# Patient Record
Sex: Female | Born: 1990 | Race: Black or African American | Hispanic: No | Marital: Single | Smoking: Never smoker
Health system: Southern US, Community
[De-identification: ages and names within clinical notes are randomized; demographics above are authoritative.]

## PROBLEM LIST (undated history)

## (undated) ENCOUNTER — Inpatient Hospital Stay (HOSPITAL_COMMUNITY): Payer: BLUE CROSS/BLUE SHIELD

## (undated) DIAGNOSIS — K3184 Gastroparesis: Secondary | ICD-10-CM

## (undated) DIAGNOSIS — E1143 Type 2 diabetes mellitus with diabetic autonomic (poly)neuropathy: Secondary | ICD-10-CM

## (undated) DIAGNOSIS — E119 Type 2 diabetes mellitus without complications: Secondary | ICD-10-CM

## (undated) DIAGNOSIS — N189 Chronic kidney disease, unspecified: Secondary | ICD-10-CM

## (undated) DIAGNOSIS — E0590 Thyrotoxicosis, unspecified without thyrotoxic crisis or storm: Secondary | ICD-10-CM

## (undated) DIAGNOSIS — K253 Acute gastric ulcer without hemorrhage or perforation: Secondary | ICD-10-CM

## (undated) DIAGNOSIS — K920 Hematemesis: Secondary | ICD-10-CM

## (undated) DIAGNOSIS — I10 Essential (primary) hypertension: Secondary | ICD-10-CM

## (undated) DIAGNOSIS — R9431 Abnormal electrocardiogram [ECG] [EKG]: Secondary | ICD-10-CM

## (undated) DIAGNOSIS — B9681 Helicobacter pylori [H. pylori] as the cause of diseases classified elsewhere: Secondary | ICD-10-CM

## (undated) DIAGNOSIS — K219 Gastro-esophageal reflux disease without esophagitis: Secondary | ICD-10-CM

## (undated) HISTORY — DX: Gastroparesis: K31.84

## (undated) HISTORY — DX: Abnormal electrocardiogram (ECG) (EKG): R94.31

## (undated) HISTORY — DX: Helicobacter pylori (H. pylori) as the cause of diseases classified elsewhere: K25.3

## (undated) HISTORY — DX: Helicobacter pylori (H. pylori) as the cause of diseases classified elsewhere: B96.81

## (undated) HISTORY — DX: Chronic kidney disease, unspecified: N18.9

## (undated) HISTORY — DX: Gastro-esophageal reflux disease without esophagitis: K21.9

## (undated) HISTORY — DX: Thyrotoxicosis, unspecified without thyrotoxic crisis or storm: E05.90

## (undated) HISTORY — PX: WISDOM TOOTH EXTRACTION: SHX21

---
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
## 2011-06-14 DIAGNOSIS — N390 Urinary tract infection, site not specified: Secondary | ICD-10-CM | POA: Insufficient documentation

## 2011-06-14 DIAGNOSIS — R350 Frequency of micturition: Secondary | ICD-10-CM | POA: Insufficient documentation

## 2012-02-09 ENCOUNTER — Emergency Department (HOSPITAL_COMMUNITY)
Admission: EM | Admit: 2012-02-09 | Discharge: 2012-02-09 | Disposition: A | Discharge status: Home / Self Care | Payer: Self-pay | Attending: Emergency Medicine | Admitting: Emergency Medicine

## 2012-02-09 ENCOUNTER — Encounter (HOSPITAL_COMMUNITY): Payer: Self-pay | Admitting: *Deleted

## 2012-02-09 DIAGNOSIS — R000 Tachycardia, unspecified: Secondary | ICD-10-CM | POA: Insufficient documentation

## 2012-02-09 DIAGNOSIS — Z87891 Personal history of nicotine dependence: Secondary | ICD-10-CM | POA: Insufficient documentation

## 2012-02-09 DIAGNOSIS — B9789 Other viral agents as the cause of diseases classified elsewhere: Secondary | ICD-10-CM | POA: Insufficient documentation

## 2012-02-09 DIAGNOSIS — R739 Hyperglycemia, unspecified: Secondary | ICD-10-CM

## 2012-02-09 DIAGNOSIS — Z794 Long term (current) use of insulin: Secondary | ICD-10-CM | POA: Insufficient documentation

## 2012-02-09 DIAGNOSIS — Z3202 Encounter for pregnancy test, result negative: Secondary | ICD-10-CM | POA: Insufficient documentation

## 2012-02-09 DIAGNOSIS — B349 Viral infection, unspecified: Secondary | ICD-10-CM

## 2012-02-09 DIAGNOSIS — E1169 Type 2 diabetes mellitus with other specified complication: Secondary | ICD-10-CM | POA: Insufficient documentation

## 2012-02-09 DIAGNOSIS — Z79899 Other long term (current) drug therapy: Secondary | ICD-10-CM | POA: Insufficient documentation

## 2012-02-09 LAB — BASIC METABOLIC PANEL
Chloride: 94 mEq/L — ABNORMAL LOW (ref 96–112)
Creatinine, Ser: 0.57 mg/dL (ref 0.50–1.10)
GFR calc Af Amer: >90 mL/min (ref >90)
GFR calc non Af Amer: >90 mL/min (ref >90)
Potassium: 4.2 mEq/L (ref 3.5–5.1)

## 2012-02-09 LAB — CBC WITH DIFFERENTIAL/PLATELET
Basophils Absolute: 0.1 10*3/uL (ref 0.0–0.1)
Basophils Relative: 0 % (ref 0–1)
HCT: 36.7 % (ref 36.0–46.0)
Hemoglobin: 12.6 g/dL (ref 12.0–15.0)
MCH: 29 pg (ref 26.0–34.0)
MCV: 84.6 fL (ref 78.0–100.0)
Neutro Abs: 8.1 10*3/uL — ABNORMAL HIGH (ref 1.7–7.7)
Neutrophils Relative %: 69 % (ref 43–77)
Platelets: 342 10*3/uL (ref 150–400)
RBC: 4.34 MIL/uL (ref 3.87–5.11)
WBC: 11.7 10*3/uL — ABNORMAL HIGH (ref 4.0–10.5)

## 2012-02-09 LAB — URINALYSIS, ROUTINE W REFLEX MICROSCOPIC
Ketones, ur: 40 mg/dL — AB
Leukocytes, UA: NEGATIVE
Nitrite: NEGATIVE
Specific Gravity, Urine: 1.046 — ABNORMAL HIGH (ref 1.005–1.030)
Urobilinogen, UA: 0.2 mg/dL (ref 0.0–1.0)
pH: 6.5 (ref 5.0–8.0)

## 2012-02-09 LAB — GLUCOSE, CAPILLARY: Glucose-Capillary: 306 mg/dL — ABNORMAL HIGH (ref 70–99)

## 2012-02-09 LAB — URINE MICROSCOPIC-ADD ON

## 2012-02-09 MED ORDER — ACETAMINOPHEN 325 MG PO TABS
650.0000 mg | ORAL_TABLET | Freq: Once | ORAL | Status: AC
  Administered 2012-02-09: 650 mg via ORAL
  Filled 2012-02-09: qty 2

## 2012-02-09 MED ORDER — SODIUM CHLORIDE 0.9 % IV BOLUS (SEPSIS)
1000.0000 mL | Freq: Once | INTRAVENOUS | Status: AC
  Administered 2012-02-09: 1000 mL via INTRAVENOUS

## 2012-02-09 MED ORDER — PSEUDOEPHEDRINE HCL 30 MG PO TABS: 30.0000 mg | ORAL_TABLET | Freq: Four times a day (QID) | ORAL | Status: DC | PRN

## 2012-02-09 MED ORDER — METFORMIN HCL 1000 MG PO TABS: 1000.0000 mg | ORAL_TABLET | Freq: Two times a day (BID) | ORAL | Status: DC

## 2012-02-09 MED ORDER — KETOROLAC TROMETHAMINE 30 MG/ML IJ SOLN
30.0000 mg | Freq: Once | INTRAMUSCULAR | Status: AC
  Administered 2012-02-09: 30 mg via INTRAVENOUS
  Filled 2012-02-09: qty 1

## 2012-02-09 NOTE — ED Provider Notes (Signed)
History   This chart was scribed for Charles B. Bernette Mayers, MD, by Frederik Pear, ER scribe. The patient was seen in room TR09C/TR09C and the patient's care was started at 1040.    CSN: [id number]  Arrival date & time 02/09/12  7829   First MD Initiated Contact with Patient 02/09/12 1040      No chief complaint on file.   (Consider location/radiation/quality/duration/timing/severity/associated sxs/prior treatment) HPI  Kelli Hudson is a 21 y.o. Female who presents to the Emergency Department complaining of constant, moderate eye pain with associated green drainage and swelling that began this morning when she awoke. She reports associated congestion, coughing, low grade fever, left-sided facial and dental pain with associated pressure, rhinorrhea, and sinus congestions that began earlier in the week. In ED, her temperature is 99. She denies any associated emesis or rashes. She denies any treatments at home. She has a h/o of DM and has not taken medication for the past several years. She denies checking her glucose levels recently or on a regular basis.    History reviewed. No pertinent past medical history.  History reviewed. No pertinent past surgical history.  No family history on file.  History  Substance Use Topics  . Smoking status: Former Games developer  . Smokeless tobacco: Not on file  . Alcohol Use: No    OB History    Grav Para Term Preterm Abortions TAB SAB Ect Mult Living                  Review of Systems A complete 10 system review of systems was obtained and all systems are negative except as noted in the HPI and PMH.  Allergies  Review of patient's allergies indicates no known allergies.  Home Medications   Current Outpatient Rx  Name  Route  Sig  Dispense  Refill  . NOVOLOG FLEXPEN [place]   Subcutaneous   Inject into the skin.           . INSULIN GLARGINE 100 UNIT/ML [place] SOLN   Subcutaneous   Inject 16 Units into the skin at bedtime.           [name]  LISINOPRIL 5 MG PO TABS   Oral   Take 5 mg by mouth daily.           [name] METFORMIN HCL 500 MG PO TABS   Oral   Take 500 mg by mouth 2 (two) times daily with a meal.             BP 130/77  Pulse 123  Temp 99 F (37.2 C) (Oral)  Resp 18  SpO2 98%  Physical Exam  Nursing note and vitals reviewed. Constitutional: She is oriented to person, place, and time. She appears well-developed and well-nourished.  HENT:  Head: Normocephalic and atraumatic.       She has swelling in the left nare.  Eyes: EOM are normal. Pupils are equal, round, and reactive to light. Left eye exhibits discharge. No scleral icterus.  Neck: Normal range of motion. Neck supple.  Cardiovascular: Normal heart sounds and intact distal pulses.        tachycardia  Pulmonary/Chest: Effort normal and breath sounds normal.  Abdominal: Bowel sounds are normal. She exhibits no distension. There is no tenderness.  Musculoskeletal: Normal range of motion. She exhibits no edema and no tenderness.  Neurological: She is alert and oriented to person, place, and time. She has normal strength. No cranial nerve deficit or sensory deficit.  Skin: Skin  is warm and dry. No rash noted.  Psychiatric: She has a normal mood and affect.    ED Course  Procedures (including critical care time)  DIAGNOSTIC STUDIES: Oxygen Saturation is 98% on room air, normal by my interpretation.    COORDINATION OF CARE:  10:51- Discussed planned course of treatment with the patient, including blood work, who is agreeable at this time.  11:01- Recheck- Discussed that her glucose is currently at a 306. She would like to address the issue while she is in the ED today.  Results for orders placed during the hospital encounter of 02/09/12  GLUCOSE, CAPILLARY      Component Value Range   Glucose-Capillary 306 (*) 70 - 99 mg/dL   Comment 1 Call MD NNP PA CNM    CBC WITH DIFFERENTIAL      Component Value Range   WBC 11.7 (*) 4.0 - 10.5 K/uL   RBC  4.34  3.87 - 5.11 MIL/uL   Hemoglobin 12.6  12.0 - 15.0 g/dL   HCT 16.1  09.6 - 04.5 %   MCV 84.6  78.0 - 100.0 fL   MCH 29.0  26.0 - 34.0 pg   MCHC 34.3  30.0 - 36.0 g/dL   RDW 40.9  81.1 - 91.4 %   Platelets 342  150 - 400 K/uL   Neutrophils Relative 69  43 - 77 %   Neutro Abs 8.1 (*) 1.7 - 7.7 K/uL   Lymphocytes Relative 19  12 - 46 %   Lymphs Abs 2.2  0.7 - 4.0 K/uL   Monocytes Relative 11  3 - 12 %   Monocytes Absolute 1.3 (*) 0.1 - 1.0 K/uL   Eosinophils Relative 0  0 - 5 %   Eosinophils Absolute 0.0  0.0 - 0.7 K/uL   Basophils Relative 0  0 - 1 %   Basophils Absolute 0.1  0.0 - 0.1 K/uL  BASIC METABOLIC PANEL      Component Value Range   Sodium 132 (*) 135 - 145 mEq/L   Potassium 4.2  3.5 - 5.1 mEq/L   Chloride 94 (*) 96 - 112 mEq/L   CO2 25  19 - 32 mEq/L   Glucose, Bld 322 (*) 70 - 99 mg/dL   BUN 9  6 - 23 mg/dL   Creatinine, Ser 7.82  0.50 - 1.10 mg/dL   Calcium 9.8  8.4 - 95.6 mg/dL   GFR calc non Af Amer >90  >90 mL/min   GFR calc Af Amer >90  >90 mL/min  URINALYSIS, ROUTINE W REFLEX MICROSCOPIC      Component Value Range   Color, Urine YELLOW  YELLOW   APPearance CLEAR  CLEAR   Specific Gravity, Urine 1.046 (*) 1.005 - 1.030   pH 6.5  5.0 - 8.0   Glucose, UA >1000 (*) NEGATIVE mg/dL   Hgb urine dipstick NEGATIVE  NEGATIVE   Bilirubin Urine NEGATIVE  NEGATIVE   Ketones, ur 40 (*) NEGATIVE mg/dL   Protein, ur NEGATIVE  NEGATIVE mg/dL   Urobilinogen, UA 0.2  0.0 - 1.0 mg/dL   Nitrite NEGATIVE  NEGATIVE   Leukocytes, UA NEGATIVE  NEGATIVE  PREGNANCY, URINE      Component Value Range   Preg Test, Ur NEGATIVE  NEGATIVE  URINE MICROSCOPIC-ADD ON      Component Value Range   Squamous Epithelial / LPF RARE  RARE   WBC, UA 0-2  <3 WBC/hpf   RBC / HPF 0-2  <3 RBC/hpf  Bacteria, UA RARE  RARE      Results for orders placed during the hospital encounter of 02/09/12  GLUCOSE, CAPILLARY      Component Value Range   Glucose-Capillary 306 (*) 70 - 99 mg/dL    Comment 1 Call MD NNP PA CNM        Labs Reviewed - No data to display No results found.   No diagnosis found.    MDM  Pt with viral URI symptoms, but also tachycardic with hyperglycemia. She has prior history of DM, but states she has not taken any medication in over 2 years. Has been living in [place].   12:43 PM Pt feeling better. No DKA. CBG improving some. Will restart patient on Metformin for now, advised close PCP follow up to establish care and to further manage her diabetes. She has viral symptoms.   I personally performed the services described in this documentation, which was scribed in my presence. The recorded information has been reviewed and is accurate.        Charles B. Bernette Mayers, MD 02/09/12 1244

## 2012-02-09 NOTE — ED Notes (Signed)
Pt started Saturday with "snifflles". States woke this am with pain left face, drainage from left eye, sore throat, cough.

## 2012-02-09 NOTE — ED Notes (Signed)
Fluid bolus complete  

## 2012-02-09 NOTE — ED Notes (Signed)
Pt usually gets sniffles and sorethroat with weather change.  Pt states when she woke up this am her left eye was swollen with green drainage and left side pain.

## 2012-06-15 ENCOUNTER — Encounter (HOSPITAL_COMMUNITY): Payer: Self-pay | Admitting: Emergency Medicine

## 2012-06-15 ENCOUNTER — Emergency Department (HOSPITAL_COMMUNITY)
Admission: EM | Admit: 2012-06-15 | Discharge: 2012-06-15 | Disposition: A | Discharge status: Home / Self Care | Payer: Self-pay | Attending: Emergency Medicine | Admitting: Emergency Medicine

## 2012-06-15 DIAGNOSIS — R000 Tachycardia, unspecified: Secondary | ICD-10-CM | POA: Insufficient documentation

## 2012-06-15 DIAGNOSIS — E1069 Type 1 diabetes mellitus with other specified complication: Secondary | ICD-10-CM | POA: Insufficient documentation

## 2012-06-15 DIAGNOSIS — R51 Headache: Secondary | ICD-10-CM | POA: Insufficient documentation

## 2012-06-15 DIAGNOSIS — R739 Hyperglycemia, unspecified: Secondary | ICD-10-CM

## 2012-06-15 DIAGNOSIS — Z87891 Personal history of nicotine dependence: Secondary | ICD-10-CM | POA: Insufficient documentation

## 2012-06-15 DIAGNOSIS — R1110 Vomiting, unspecified: Secondary | ICD-10-CM

## 2012-06-15 DIAGNOSIS — Z3202 Encounter for pregnancy test, result negative: Secondary | ICD-10-CM | POA: Insufficient documentation

## 2012-06-15 DIAGNOSIS — R112 Nausea with vomiting, unspecified: Secondary | ICD-10-CM | POA: Insufficient documentation

## 2012-06-15 DIAGNOSIS — Z79899 Other long term (current) drug therapy: Secondary | ICD-10-CM | POA: Insufficient documentation

## 2012-06-15 HISTORY — DX: Type 2 diabetes mellitus without complications: E11.9

## 2012-06-15 LAB — COMPREHENSIVE METABOLIC PANEL
ALT: 12 U/L (ref 0–35)
AST: 15 U/L (ref 0–37)
Alkaline Phosphatase: 106 U/L (ref 39–117)
CO2: 27 mEq/L (ref 19–32)
Calcium: 8.6 mg/dL (ref 8.4–10.5)
Chloride: 98 mEq/L (ref 96–112)
GFR calc Af Amer: >90 mL/min (ref >90)
GFR calc non Af Amer: >90 mL/min (ref >90)
Glucose, Bld: 459 mg/dL — ABNORMAL HIGH (ref 70–99)
Sodium: 134 mEq/L — ABNORMAL LOW (ref 135–145)
Total Bilirubin: 0.4 mg/dL (ref 0.3–1.2)

## 2012-06-15 LAB — URINALYSIS, MICROSCOPIC ONLY
Glucose, UA: >1000 mg/dL — AB
Ketones, ur: NEGATIVE mg/dL
Leukocytes, UA: NEGATIVE
Protein, ur: NEGATIVE mg/dL
Urobilinogen, UA: 0.2 mg/dL (ref 0.0–1.0)

## 2012-06-15 LAB — GLUCOSE, CAPILLARY
Glucose-Capillary: 439 mg/dL — ABNORMAL HIGH (ref 70–99)
Glucose-Capillary: 197 mg/dL — ABNORMAL HIGH (ref 70–99)

## 2012-06-15 LAB — CBC WITH DIFFERENTIAL/PLATELET
Basophils Absolute: 0 10*3/uL (ref 0.0–0.1)
Basophils Relative: 0 % (ref 0–1)
Eosinophils Absolute: 0.1 10*3/uL (ref 0.0–0.7)
Hemoglobin: 13.9 g/dL (ref 12.0–15.0)
MCH: 28.8 pg (ref 26.0–34.0)
MCHC: 35.8 g/dL (ref 30.0–36.0)
Neutro Abs: 3.8 10*3/uL (ref 1.7–7.7)
Neutrophils Relative %: 56 % (ref 43–77)
Platelets: 339 10*3/uL (ref 150–400)
RDW: 12.7 % (ref 11.5–15.5)

## 2012-06-15 LAB — LIPASE, BLOOD: Lipase: 26 U/L (ref 11–59)

## 2012-06-15 LAB — POCT PREGNANCY, URINE: Preg Test, Ur: NEGATIVE

## 2012-06-15 MED ORDER — SODIUM CHLORIDE 0.9 % IV BOLUS (SEPSIS)
1000.0000 mL | Freq: Once | INTRAVENOUS | Status: AC
  Administered 2012-06-15: 1000 mL via INTRAVENOUS

## 2012-06-15 MED ORDER — METOCLOPRAMIDE HCL 5 MG/ML IJ SOLN
10.0000 mg | Freq: Once | INTRAMUSCULAR | Status: AC
  Administered 2012-06-15: 10 mg via INTRAVENOUS
  Filled 2012-06-15: qty 2

## 2012-06-15 MED ORDER — INSULIN ASPART 100 UNIT/ML ~~LOC~~ SOLN
8.0000 [IU] | Freq: Once | SUBCUTANEOUS | Status: AC
  Administered 2012-06-15: 8 [IU] via SUBCUTANEOUS
  Filled 2012-06-15: qty 1

## 2012-06-15 MED ORDER — INSULIN GLARGINE 100 UNIT/ML ~~LOC~~ SOLN: 15.0000 [IU] | Freq: Every day | SUBCUTANEOUS | Status: DC

## 2012-06-15 NOTE — ED Notes (Signed)
Pt here for N/V starting this afternoon; pt sts out of insulin x 3 months; pt sts frequent HA

## 2012-06-15 NOTE — ED Provider Notes (Signed)
History     CSN: [id number]  Arrival date & time 06/15/12  1847   First MD Initiated Contact with Patient 06/15/12 1920      Chief Complaint  Patient presents with  . Emesis  . Headache    (Consider location/radiation/quality/duration/timing/severity/associated sxs/prior treatment) Patient is a 22 y.o. female presenting with vomiting, headaches, and diabetes problem. The history is provided by the patient and medical records.  Emesis Severity:  Moderate Duration:  1 day Timing:  Constant Number of daily episodes:  4 Quality:  Stomach contents Able to tolerate:  Liquids Associated symptoms: headaches (gradual in onset)   Associated symptoms: no abdominal pain, no chills and no diarrhea   Headache Associated symptoms: nausea and vomiting   Associated symptoms: no abdominal pain, no cough, no diarrhea, no dizziness, no fever, no neck pain, no neck stiffness, no numbness and no seizures   Diabetes She presents for her follow-up diabetic visit. She has type 1 diabetes mellitus. Her disease course has been worsening. Hypoglycemia symptoms include headaches (gradual in onset). Pertinent negatives for hypoglycemia include no confusion, dizziness, seizures, speech difficulty or tremors. Pertinent negatives for diabetes include no chest pain and no weakness. (Has history of type 1 diabetes mellitus; non-compliant with prescribed insulin in 3 months)    Past Medical History  Diagnosis Date  . Diabetes mellitus without complication     History reviewed. No pertinent past surgical history.  History reviewed. No pertinent family history.  History  Substance Use Topics  . Smoking status: Former Games developer  . Smokeless tobacco: Not on file  . Alcohol Use: Yes    OB History   Grav Para Term Preterm Abortions TAB SAB Ect Mult Living                  Review of Systems  Constitutional: Negative for fever, chills, activity change and appetite change.  HENT: Negative for neck pain and  neck stiffness.   Respiratory: Negative for cough, chest tightness, shortness of breath and wheezing.   Cardiovascular: Negative for chest pain and palpitations.  Gastrointestinal: Positive for nausea and vomiting. Negative for abdominal pain, diarrhea, constipation, blood in stool and abdominal distention.  Genitourinary: Negative for dysuria, decreased urine volume and difficulty urinating.  Skin: Negative for rash and wound.  Neurological: Positive for headaches (gradual in onset). Negative for dizziness, tremors, seizures, syncope, facial asymmetry, speech difficulty, weakness, light-headedness and numbness.  Psychiatric/Behavioral: Negative for confusion and agitation.  All other systems reviewed and are negative.    Allergies  Review of patient's allergies indicates no known allergies.  Home Medications   Current Outpatient Rx  Name  Route  Sig  Dispense  Refill  . diphenhydrAMINE (BENADRYL) 25 mg capsule   Oral   Take 25 mg by mouth daily as needed for itching or allergies.         . metFORMIN (GLUCOPHAGE) 1000 MG tablet   Oral   Take 1,000 mg by mouth 2 (two) times daily.           BP 135/77  Pulse 95  Temp(Src) 98.4 F (36.9 C) (Oral)  Resp 18  Ht 5\' 8"  (1.727 m)  Wt 170 lb (77.111 kg)  BMI 25.85 kg/m2  SpO2 100%  Physical Exam  Nursing note and vitals reviewed. Constitutional: She is oriented to person, place, and time. She appears well-developed and well-nourished.  HENT:  Head: Normocephalic and atraumatic.  Right Ear: External ear normal.  Left Ear: External ear normal.  Nose:  Nose normal.  Mouth/Throat: Oropharynx is clear and moist. No oropharyngeal exudate.  Eyes: Conjunctivae are normal. Pupils are equal, round, and reactive to light.  Neck: Normal range of motion. Neck supple.  Cardiovascular: Regular rhythm, normal heart sounds and intact distal pulses.  Exam reveals no gallop and no friction rub.   No murmur heard. Tachycardic to the 100's   Pulmonary/Chest: Effort normal and breath sounds normal. No respiratory distress. She has no wheezes. She has no rales. She exhibits no tenderness.  Abdominal: Soft. Bowel sounds are normal. She exhibits no distension and no mass. There is no tenderness. There is no rebound and no guarding.  Musculoskeletal: Normal range of motion. She exhibits no edema and no tenderness.  Neurological: She is alert and oriented to person, place, and time.  Skin: Skin is warm and dry.  Psychiatric: She has a normal mood and affect. Her behavior is normal. Judgment and thought content normal.    ED Course  Procedures (including critical care time)  Labs Reviewed  COMPREHENSIVE METABOLIC PANEL - Abnormal; Notable for the following:    Sodium 134 (*)    Glucose, Bld 459 (*)    All other components within normal limits  URINALYSIS, MICROSCOPIC ONLY - Abnormal; Notable for the following:    Specific Gravity, Urine 1.036 (*)    Glucose, UA >1000 (*)    Bacteria, UA FEW (*)    Squamous Epithelial / LPF FEW (*)    All other components within normal limits  GLUCOSE, CAPILLARY - Abnormal; Notable for the following:    Glucose-Capillary 439 (*)    All other components within normal limits  CBC WITH DIFFERENTIAL  LIPASE, BLOOD  POCT PREGNANCY, URINE   No results found.   1. Insulin dependent diabetes mellitus   2. Emesis   3. Hyperglycemia without ketosis       MDM  22 yo F w/hx of IDDM presents for several days of generalized weakness and 1 day of non-bloody/non-bilious emesis. Has not taken her insulin or checked her blood glucose in 3 months. Blood glucose here 459 without ketones in urine. Pt also with associated gradual in onset headache, similar to previous headaches she gets associated with her hyperglycemia. Clinical picture not concerning for meningitis, cavernous sinus thrombosis, or subarachnoid hemorrhage. Will provide IVF and reglan for headache and emesis and her normal dose of regular  insulin.   Blood glucose decreased to <200 and headache and nausea resolved. No emesis in ED and pt not clinically dehydrated. I also spoke with case manager regarding her inability to afford her insulin. Case manager spoke with patient in ED and set her up for her to obtain insulin when she returns tomorrow during business hours and for her to obtain a PCP. I gave the patient a prescription for the lantus dose (15 units nightly) she had previously been taking until 3 months ago and expressed understanding of its use. Patient given return precautions, including worsening of signs or symptoms. Patient instructed to follow-up with primary care physician.          Clemetine Marker, MD 06/15/12 2224

## 2012-06-15 NOTE — ED Notes (Signed)
Patient presents with c/o nausea and increased blood sugars. Hx DM. On metformin and insulin. Reports being out of insulin for quite some time. No PCP. Denies increased urination. Endorses increased thirst.

## 2012-06-15 NOTE — ED Provider Notes (Signed)
I saw and evaluated the patient, reviewed the resident's note and I agree with the findings and plan. On my exam this young female with noncompliance with her insulin dosing was in no distress.  No evidence of anion gap, and following resuscitation with fluids, we discussed her care with his management to assist with obtaining her medication, and she is appropriate for discharge to  Gerhard Munch, MD 06/15/12 2353

## 2012-06-15 NOTE — ED Notes (Signed)
Provided pt with primary care resource list, as well as info on applying for the [facility] orange card.  Pt insructed to f/u with ED RN CM on 06/16/12 for assistance with meds.

## 2015-07-12 ENCOUNTER — Encounter (HOSPITAL_COMMUNITY): Payer: Self-pay | Admitting: Neurology

## 2015-07-12 ENCOUNTER — Emergency Department (HOSPITAL_COMMUNITY)
Admission: EM | Admit: 2015-07-12 | Discharge: 2015-07-12 | Disposition: A | Discharge status: Home / Self Care | Payer: BLUE CROSS/BLUE SHIELD | Attending: Emergency Medicine | Admitting: Emergency Medicine

## 2015-07-12 DIAGNOSIS — E119 Type 2 diabetes mellitus without complications: Secondary | ICD-10-CM | POA: Insufficient documentation

## 2015-07-12 DIAGNOSIS — Z7984 Long term (current) use of oral hypoglycemic drugs: Secondary | ICD-10-CM | POA: Insufficient documentation

## 2015-07-12 DIAGNOSIS — Z794 Long term (current) use of insulin: Secondary | ICD-10-CM | POA: Diagnosis not present

## 2015-07-12 DIAGNOSIS — N63 Unspecified lump in breast: Secondary | ICD-10-CM | POA: Diagnosis present

## 2015-07-12 DIAGNOSIS — Z87891 Personal history of nicotine dependence: Secondary | ICD-10-CM | POA: Insufficient documentation

## 2015-07-12 DIAGNOSIS — N644 Mastodynia: Secondary | ICD-10-CM | POA: Diagnosis not present

## 2015-07-12 MED ORDER — IBUPROFEN 600 MG PO TABS: 600.0000 mg | ORAL_TABLET | Freq: Four times a day (QID) | ORAL | Status: DC | PRN

## 2015-07-12 NOTE — ED Notes (Signed)
Pt reports lump to right breast she discovered several days ago and it has been hurting.

## 2015-07-12 NOTE — Discharge Instructions (Signed)
Breast Tenderness  Breast tenderness is a common problem for women of all ages. Breast tenderness may cause mild discomfort to severe pain. It has a variety of causes. Your health care provider will find out the likely cause of your breast tenderness by examining your breasts, asking you about symptoms, and ordering some tests. Breast tenderness usually does not mean you have breast cancer.  HOME CARE INSTRUCTIONS   Breast tenderness often can be handled at home. You can try:   Getting fitted for a new bra that provides more support, especially during exercise.   Wearing a more supportive bra or sports bra while sleeping when your breasts are very tender.   If you have a breast injury, apply ice to the area:    Put ice in a plastic bag.    Place a towel between your skin and the bag.    Leave the ice on for 20 minutes, 2-3 times a day.   If your breasts are too full of milk as a result of breastfeeding, try:    Expressing milk either by hand or with a breast pump.    Applying a warm compress to the breasts for relief.   Taking over-the-counter pain relievers, if approved by your health care provider.   Taking other medicines that your health care provider prescribes. These may include antibiotic medicines or birth control pills.  Over the long term, your breast tenderness might be eased if you:   Cut down on caffeine.   Reduce the amount of fat in your diet.  Keep a log of the days and times when your breasts are most tender. This will help you and your health care provider find the cause of the tenderness and how to relieve it. Also, learn how to do breast exams at home. This will help you notice if you have an unusual growth or lump that could cause tenderness.  SEEK MEDICAL CARE IF:    Any part of your breast is hard, red, and hot to the touch. This could be a sign of infection.   Fluid is coming out of your nipples (and you are not breastfeeding). Especially watch for blood or pus.   You have a fever  as well as breast tenderness.   You have a new or painful lump in your breast that remains after your menstrual period ends.   You have tried to take care of the pain at home, but it has not gone away.   Your breast pain is getting worse, or the pain is making it hard to do the things you usually do during your day.     This information is not intended to replace advice given to you by your health care provider. Make sure you discuss any questions you have with your health care provider.     Document Released: 01/17/2008 Document Revised: 10/06/2012 Document Reviewed: 09/02/2012  Elsevier Interactive Patient Education 2016 [employer]

## 2015-07-12 NOTE — ED Provider Notes (Signed)
CSN: [id number]     Arrival date & time 07/12/15  1011 History   First MD Initiated Contact with Patient 07/12/15 1026     Chief Complaint  Patient presents with  . Breast Mass     (Consider location/radiation/quality/duration/timing/severity/associated sxs/prior Treatment) HPI Comments: Patients with complaint of painful lump in her right breast that was first noticed about 4 days ago. No change in the size of the mass. No redness, fever or drainage. No nipple discharge. No alteration in normal schedule of menses or reported chance of pregnancy. No axillary pain. History of breast cancers in distant family members only.  The history is provided by the patient. No language interpreter was used.    Past Medical History  Diagnosis Date  . Diabetes mellitus without complication (HCC)    History reviewed. No pertinent past surgical history. No family history on file. Social History  Substance Use Topics  . Smoking status: Former Games developermoker  . Smokeless tobacco: None  . Alcohol Use: Yes   OB History    No data available     Review of Systems  Constitutional: Negative for fever and chills.  Respiratory: Negative.   Cardiovascular: Positive for chest pain (See HPI.).  Musculoskeletal: Negative.   Skin: Negative.   Neurological: Negative.   Hematological: Negative for adenopathy.      Allergies  Review of patient's allergies indicates no known allergies.  Home Medications   Prior to Admission medications   Medication Sig Start Date End Date Taking? Authorizing Provider  diphenhydrAMINE (BENADRYL) 25 mg capsule Take 25 mg by mouth daily as needed for itching or allergies.    Historical Provider, MD  insulin glargine (LANTUS) 100 UNIT/ML injection Inject 0.15 mLs (15 Units total) into the skin at bedtime. Patient not taking: Reported on 07/12/2015 06/15/12   Clemetine MarkerGreg Peters, MD  metFORMIN (GLUCOPHAGE) 1000 MG tablet Take 1,000 mg by mouth 2 (two) times daily. 02/09/12   Susy Frizzleharles  Sheldon, MD   BP 138/98 mmHg  Pulse 107  Temp(Src) 98.2 F (36.8 C) (Oral)  Resp 14  SpO2 100%  LMP 06/18/2015 Physical Exam  Constitutional: She is oriented to person, place, and time. She appears well-developed and well-nourished.  Neck: Normal range of motion.  Pulmonary/Chest: Effort normal. Right breast exhibits mass and tenderness. Right breast exhibits no inverted nipple, no nipple discharge and no skin change. Left breast exhibits no inverted nipple, no mass, no nipple discharge, no skin change and no tenderness. Breasts are symmetrical.    Neurological: She is alert and oriented to person, place, and time.  Skin: Skin is warm and dry.    ED Course  Procedures (including critical care time) Labs Review Labs Reviewed - No data to display  Imaging Review No results found. I have personally reviewed and evaluated these images and lab results as part of my medical decision-making.   EKG Interpretation None      MDM   Final diagnoses:  None    1. Right breast tenderness  The patient is very well appearing. Breast mass that is tender for 4 days without evidence abscess or infection. [facility].    Elpidio AnisShari Catalyna Reilly, PA-C 07/12/15 1106  Gwyneth SproutWhitney Plunkett, MD 07/12/15 [phone]

## 2015-09-02 ENCOUNTER — Inpatient Hospital Stay (HOSPITAL_COMMUNITY)
Admission: EM | Admit: 2015-09-02 | Discharge: 2015-09-09 | DRG: 638 | Disposition: A | Discharge status: Home / Self Care | Payer: BLUE CROSS/BLUE SHIELD | Attending: Internal Medicine | Admitting: Internal Medicine

## 2015-09-02 ENCOUNTER — Encounter (HOSPITAL_COMMUNITY): Payer: Self-pay

## 2015-09-02 DIAGNOSIS — IMO0002 Reserved for concepts with insufficient information to code with codable children: Secondary | ICD-10-CM | POA: Diagnosis present

## 2015-09-02 DIAGNOSIS — E870 Hyperosmolality and hypernatremia: Secondary | ICD-10-CM | POA: Diagnosis present

## 2015-09-02 DIAGNOSIS — K2970 Gastritis, unspecified, without bleeding: Secondary | ICD-10-CM | POA: Diagnosis present

## 2015-09-02 DIAGNOSIS — E0800 Diabetes mellitus due to underlying condition with hyperosmolarity without nonketotic hyperglycemic-hyperosmolar coma (NKHHC): Secondary | ICD-10-CM | POA: Diagnosis not present

## 2015-09-02 DIAGNOSIS — R739 Hyperglycemia, unspecified: Secondary | ICD-10-CM | POA: Insufficient documentation

## 2015-09-02 DIAGNOSIS — R1033 Periumbilical pain: Secondary | ICD-10-CM | POA: Diagnosis not present

## 2015-09-02 DIAGNOSIS — E1165 Type 2 diabetes mellitus with hyperglycemia: Secondary | ICD-10-CM | POA: Diagnosis not present

## 2015-09-02 DIAGNOSIS — E860 Dehydration: Secondary | ICD-10-CM | POA: Diagnosis present

## 2015-09-02 DIAGNOSIS — G43A1 Cyclical vomiting, intractable: Secondary | ICD-10-CM | POA: Diagnosis not present

## 2015-09-02 DIAGNOSIS — Z794 Long term (current) use of insulin: Secondary | ICD-10-CM

## 2015-09-02 DIAGNOSIS — R079 Chest pain, unspecified: Secondary | ICD-10-CM

## 2015-09-02 DIAGNOSIS — R109 Unspecified abdominal pain: Secondary | ICD-10-CM | POA: Insufficient documentation

## 2015-09-02 DIAGNOSIS — D72829 Elevated white blood cell count, unspecified: Secondary | ICD-10-CM

## 2015-09-02 DIAGNOSIS — Z818 Family history of other mental and behavioral disorders: Secondary | ICD-10-CM

## 2015-09-02 DIAGNOSIS — K5900 Constipation, unspecified: Secondary | ICD-10-CM | POA: Diagnosis not present

## 2015-09-02 DIAGNOSIS — K209 Esophagitis, unspecified: Secondary | ICD-10-CM | POA: Diagnosis present

## 2015-09-02 DIAGNOSIS — R000 Tachycardia, unspecified: Secondary | ICD-10-CM | POA: Diagnosis present

## 2015-09-02 DIAGNOSIS — R52 Pain, unspecified: Secondary | ICD-10-CM

## 2015-09-02 DIAGNOSIS — Z801 Family history of malignant neoplasm of trachea, bronchus and lung: Secondary | ICD-10-CM

## 2015-09-02 DIAGNOSIS — E119 Type 2 diabetes mellitus without complications: Secondary | ICD-10-CM | POA: Diagnosis present

## 2015-09-02 LAB — URINALYSIS, ROUTINE W REFLEX MICROSCOPIC
Bilirubin Urine: NEGATIVE
Hgb urine dipstick: NEGATIVE
LEUKOCYTES UA: NEGATIVE
NITRITE: NEGATIVE
PH: 6 (ref 5.0–8.0)
Protein, ur: NEGATIVE mg/dL
SPECIFIC GRAVITY, URINE: 1.044 — AB (ref 1.005–1.030)

## 2015-09-02 LAB — BASIC METABOLIC PANEL
ANION GAP: 10 (ref 5–15)
BUN: 13 mg/dL (ref 6–20)
CALCIUM: 8.5 mg/dL — AB (ref 8.9–10.3)
CO2: 23 mmol/L (ref 22–32)
Chloride: 105 mmol/L (ref 101–111)
Creatinine, Ser: 0.61 mg/dL (ref 0.44–1.00)
GLUCOSE: 268 mg/dL — AB (ref 65–99)
POTASSIUM: 3.6 mmol/L (ref 3.5–5.1)
Sodium: 138 mmol/L (ref 135–145)

## 2015-09-02 LAB — CBC
HEMATOCRIT: 38.4 % (ref 36.0–46.0)
HEMOGLOBIN: 13.4 g/dL (ref 12.0–15.0)
MCH: 29.7 pg (ref 26.0–34.0)
MCHC: 34.9 g/dL (ref 30.0–36.0)
MCV: 85.1 fL (ref 78.0–100.0)
Platelets: 371 10*3/uL (ref 150–400)
RBC: 4.51 MIL/uL (ref 3.87–5.11)
RDW: 12.2 % (ref 11.5–15.5)
WBC: 7.2 10*3/uL (ref 4.0–10.5)

## 2015-09-02 LAB — COMPREHENSIVE METABOLIC PANEL
ALT: 17 U/L (ref 14–54)
ANION GAP: 12 (ref 5–15)
AST: 17 U/L (ref 15–41)
Albumin: 3.8 g/dL (ref 3.5–5.0)
Alkaline Phosphatase: 75 U/L (ref 38–126)
BILIRUBIN TOTAL: 1.9 mg/dL — AB (ref 0.3–1.2)
BUN: 15 mg/dL (ref 6–20)
CHLORIDE: 96 mmol/L — AB (ref 101–111)
CO2: 25 mmol/L (ref 22–32)
Calcium: 9.9 mg/dL (ref 8.9–10.3)
Creatinine, Ser: 0.71 mg/dL (ref 0.44–1.00)
GFR calc Af Amer: >60 mL/min (ref >60)
Glucose, Bld: 395 mg/dL — ABNORMAL HIGH (ref 65–99)
POTASSIUM: 3.5 mmol/L (ref 3.5–5.1)
Sodium: 133 mmol/L — ABNORMAL LOW (ref 135–145)
TOTAL PROTEIN: 8 g/dL (ref 6.5–8.1)

## 2015-09-02 LAB — LIPASE, BLOOD: LIPASE: 13 U/L (ref 11–51)

## 2015-09-02 LAB — GLUCOSE, CAPILLARY
GLUCOSE-CAPILLARY: 250 mg/dL — AB (ref 65–99)
GLUCOSE-CAPILLARY: 261 mg/dL — AB (ref 65–99)

## 2015-09-02 LAB — URINE MICROSCOPIC-ADD ON

## 2015-09-02 LAB — I-STAT BETA HCG BLOOD, ED (MC, WL, AP ONLY): I-stat hCG, quantitative: <5 m[IU]/mL (ref <5)

## 2015-09-02 LAB — CBG MONITORING, ED
GLUCOSE-CAPILLARY: 379 mg/dL — AB (ref 65–99)
Glucose-Capillary: 254 mg/dL — ABNORMAL HIGH (ref 65–99)

## 2015-09-02 MED ORDER — POLYETHYLENE GLYCOL 3350 17 G PO PACK
17.0000 g | PACK | Freq: Every day | ORAL | Status: DC | PRN
  Filled 2015-09-02: qty 1

## 2015-09-02 MED ORDER — ACETAMINOPHEN 650 MG RE SUPP: 650.0000 mg | Freq: Four times a day (QID) | RECTAL | Status: DC | PRN

## 2015-09-02 MED ORDER — KETOROLAC TROMETHAMINE 30 MG/ML IJ SOLN
30.0000 mg | Freq: Once | INTRAMUSCULAR | Status: AC
  Administered 2015-09-02: 30 mg via INTRAVENOUS
  Filled 2015-09-02: qty 1

## 2015-09-02 MED ORDER — ENOXAPARIN SODIUM 40 MG/0.4ML ~~LOC~~ SOLN
40.0000 mg | SUBCUTANEOUS | Status: DC
  Administered 2015-09-02 – 2015-09-08 (×7): 40 mg via SUBCUTANEOUS
  Filled 2015-09-02 (×7): qty 0.4

## 2015-09-02 MED ORDER — ONDANSETRON HCL 4 MG/2ML IJ SOLN
4.0000 mg | Freq: Once | INTRAMUSCULAR | Status: AC
Start: 2015-09-02 — End: 2015-09-02
  Administered 2015-09-02: 4 mg via INTRAVENOUS
  Filled 2015-09-02: qty 2

## 2015-09-02 MED ORDER — ACETAMINOPHEN 325 MG PO TABS: 650.0000 mg | ORAL_TABLET | Freq: Four times a day (QID) | ORAL | Status: DC | PRN

## 2015-09-02 MED ORDER — ONDANSETRON HCL 4 MG/2ML IJ SOLN
4.0000 mg | Freq: Once | INTRAMUSCULAR | Status: AC
  Administered 2015-09-02: 4 mg via INTRAVENOUS
  Filled 2015-09-02: qty 2

## 2015-09-02 MED ORDER — SODIUM CHLORIDE 0.9 % IV SOLN
INTRAVENOUS | Status: DC
  Administered 2015-09-02 – 2015-09-04 (×5): via INTRAVENOUS

## 2015-09-02 MED ORDER — SODIUM CHLORIDE 0.9 % IV BOLUS (SEPSIS)
2000.0000 mL | Freq: Once | INTRAVENOUS | Status: AC
  Administered 2015-09-02: 2000 mL via INTRAVENOUS

## 2015-09-02 MED ORDER — BISACODYL 5 MG PO TBEC: 5.0000 mg | DELAYED_RELEASE_TABLET | Freq: Every day | ORAL | Status: DC | PRN

## 2015-09-02 MED ORDER — TRAZODONE HCL 50 MG PO TABS
25.0000 mg | ORAL_TABLET | Freq: Every evening | ORAL | Status: DC | PRN
  Administered 2015-09-06 – 2015-09-08 (×3): 25 mg via ORAL
  Filled 2015-09-02 (×3): qty 1

## 2015-09-02 MED ORDER — ONDANSETRON HCL 4 MG PO TABS: 4.0000 mg | ORAL_TABLET | Freq: Four times a day (QID) | ORAL | Status: DC | PRN

## 2015-09-02 MED ORDER — SODIUM CHLORIDE 0.9 % IV BOLUS (SEPSIS)
1000.0000 mL | Freq: Once | INTRAVENOUS | Status: AC
  Administered 2015-09-02: 1000 mL via INTRAVENOUS

## 2015-09-02 MED ORDER — ONDANSETRON HCL 4 MG/2ML IJ SOLN
4.0000 mg | Freq: Four times a day (QID) | INTRAMUSCULAR | Status: DC | PRN
  Administered 2015-09-03 – 2015-09-07 (×8): 4 mg via INTRAVENOUS
  Filled 2015-09-02 (×9): qty 2

## 2015-09-02 MED ORDER — INSULIN ASPART 100 UNIT/ML IV SOLN
10.0000 [IU] | Freq: Once | INTRAVENOUS | Status: AC
  Administered 2015-09-02: 10 [IU] via INTRAVENOUS
  Filled 2015-09-02: qty 1

## 2015-09-02 MED ORDER — INSULIN ASPART 100 UNIT/ML ~~LOC~~ SOLN
0.0000 [IU] | Freq: Three times a day (TID) | SUBCUTANEOUS | Status: DC
  Administered 2015-09-03 (×2): 2 [IU] via SUBCUTANEOUS
  Administered 2015-09-03: 3 [IU] via SUBCUTANEOUS
  Administered 2015-09-04: 1 [IU] via SUBCUTANEOUS
  Administered 2015-09-04 – 2015-09-05 (×4): 2 [IU] via SUBCUTANEOUS
  Administered 2015-09-06: 3 [IU] via SUBCUTANEOUS
  Administered 2015-09-06: 2 [IU] via SUBCUTANEOUS
  Administered 2015-09-06: 1 [IU] via SUBCUTANEOUS
  Administered 2015-09-07: 2 [IU] via SUBCUTANEOUS
  Administered 2015-09-07: 1 [IU] via SUBCUTANEOUS
  Administered 2015-09-07 – 2015-09-08 (×2): 2 [IU] via SUBCUTANEOUS
  Administered 2015-09-09 (×3): 1 [IU] via SUBCUTANEOUS

## 2015-09-02 MED ORDER — METOCLOPRAMIDE HCL 5 MG/ML IJ SOLN
10.0000 mg | Freq: Four times a day (QID) | INTRAMUSCULAR | Status: DC
  Administered 2015-09-02 – 2015-09-09 (×26): 10 mg via INTRAVENOUS
  Filled 2015-09-02 (×20): qty 2

## 2015-09-02 NOTE — H&P (Signed)
History and Physical    Kelli Hudson ZOX:[id number] DOB: [birth year] DOA: 09/02/2015  PCP: No PCP Per Patient  Patient coming from:    Home    Chief Complaint: nausea, vomiting  HPI: Kelli Hudson is a 25 y.o. female with a medical history significant for diabetes diagnosed in 2008.She hasn't had any metformin or insulin in at least two years due to cost. Patient began vomiting yesterday evening. No fevers. Initially patient thought she had food poisoning.  She has chest discomfort with deep breaths and vomiting. Prior to yesterday patient felt fine. No urinary symptoms. No diarrhea. No respiratory symptoms   ED Course:  Temp 99 4, heart rate 128, respirations normal, blood pressure elevated at 140/ 101.  Patient retching.  2 fluid bolus, 10 units of insulin.   Serum glucose 395, normal gap, bicarbonate 25 total bilirubin 1.9 HCG Quant negative Urinalysis with many bacteria, no leukocytes, greater than 80 ketones and greater than 1000 glucose   Review of Systems: As per HPI, otherwise 10 point review of systems negative.   Past Medical History  Diagnosis Date  . Diabetes mellitus without complication (HCC)     History reviewed. No pertinent past surgical history.  Social History   Social History  . Marital Status: Single    Spouse Name: N/A  . Number of Children: N/A  . Years of Education: N/A   Occupational History  . Not on file.   Social History Main Topics  . Smoking status: Former Games developer  . Smokeless tobacco: Not on file  . Alcohol Use: Yes  . Drug Use: No  . Sexual Activity: Not on file   Other Topics Concern  . Not on file   Social History Narrative   Patient is Lesbian, Married. Lives at home with wife. No significant ETOH use  No Known Allergies  Family History  Problem Relation Age of Onset  . Lung cancer Mother   . Bipolar disorder Father     Prior to Admission medications   Medication Sig Start Date End Date Taking?  Authorizing Provider  None  Physical Exam: Filed Vitals:   09/02/15 1630 09/02/15 1645 09/02/15 1700 09/02/15 1715  BP: 149/98 142/91 131/79 140/101  Pulse: 115 126 125 128  Temp:      TempSrc:      Resp:    20  Height:      Weight:      SpO2: 100% 100% 99% 100%    Constitutional:  Pleasant, well-developed black female actively vomiting Filed Vitals:   09/02/15 1630 09/02/15 1645 09/02/15 1700 09/02/15 1715  BP: 149/98 142/91 131/79 140/101  Pulse: 115 126 125 128  Temp:      TempSrc:      Resp:    20  Height:      Weight:      SpO2: 100% 100% 99% 100%   Eyes: PER, lids and conjunctivae normal ENMT: Mucous membranes are moist. Posterior pharynx clear of any exudate or lesions..  Neck: normal, supple, no masses Respiratory: clear to auscultation bilaterally, no wheezing, no crackles. Normal respiratory effort. No accessory muscle use.  Cardiovascular: Regular rate and rhythm, no murmurs / rubs / gallops. No extremity edema. 2+ pedal pulses.   Abdomen: no tenderness, no masses palpated. No hepatomegaly. Bowel sounds positive.  Musculoskeletal: no clubbing / cyanosis. No joint deformity upper and lower extremities. Good ROM, no contractures. Normal muscle tone.  Skin: no rashes, lesions, ulcers.  Neurologic: CN 2-12 grossly intact. Sensation intact, Strength  5/5 in all 4.  Psychiatric: Normal judgment and insight. Alert and oriented x 3. Normal mood.   Labs on Admission: I have personally reviewed following labs and imaging studies   Urine analysis:    Component Value Date/Time   COLORURINE YELLOW 09/02/2015 1619   APPEARANCEUR CLEAR 09/02/2015 1619   LABSPEC 1.044* 09/02/2015 1619   PHURINE 6.0 09/02/2015 1619   GLUCOSEU >1000* 09/02/2015 1619   HGBUR NEGATIVE 09/02/2015 1619   BILIRUBINUR NEGATIVE 09/02/2015 1619   KETONESUR >80* 09/02/2015 1619   PROTEINUR NEGATIVE 09/02/2015 1619   UROBILINOGEN 0.2 06/15/2012 1957   NITRITE NEGATIVE 09/02/2015 1619    LEUKOCYTESUR NEGATIVE 09/02/2015 1619    Radiological Exams on Admission: No results found.  Assessment/Plan   Uncontrolled diabetes mellitus (HCC). She was on Metformin at one time and has managed not to go into DKA while off insulin so this makes type I diabetes unlikely. Presents now with vomiting, hyperglycemia without DKA  -place in OBS - Medical bed -Give another liter bolus to total 4 liters (still vomiting) -IV Reglan Q6 -Zofran IV prn -SSI. Sensitive for now - not on insulin at home.  -hgb A1c -repeat BMET now to monitor electrolytes -diabetes coordinator consult -Social Work consult -Consider 70/30 insulin (less expensive). If tolerating PO could possibly try tomorrow.                                      DVT prophylaxis:   Lovenox  Code Status:   Full code Family Communication:  None Disposition Plan:  Discharge home in 24-48 hours              Consults called:   6pm-Spoke with her Wife Angeline SlimCapri who tells me patient DOES have insurance. Has [employer]. Patient apparently choosing not to see Health Care Provider and take diabetic meds.   Admission status:  Observation - Medical bed    Willette ClusterPaula Tasnim Balentine NP [facility] Pager [phone]  If 7PM-7AM, please contact night-coverage [email or web address] Password [email or web address]  09/02/2015, 5:33 PM

## 2015-09-02 NOTE — Progress Notes (Signed)
New Admission Note:  Arrival Method: Stretcher Mental Orientation: Alert and oriented x 4 Telemetry: N/A Assessment: Completed Skin: Warm, dry and intact IV: infusing Pain: 8 abdominal  Paged MD Tubes: n/a Safety Measures: Safety Fall Prevention Plan was given, discussed and signed. Admission: Completed [address] Orientation: Patient has been orientated to the room, unit and the staff. Family: None  Orders have been reviewed and implemented. Will continue to monitor the patient. Call light has been placed within reach and bed alarm has been activated.   [place] ShiEmmanuel Atlas [place] BSN, RN  Phone Number: [phone]

## 2015-09-02 NOTE — ED Notes (Signed)
Patient here with generalized abdominal pain with vomiting since yesterday, denies diarrhea. Denies urinary symptoms

## 2015-09-02 NOTE — ED Provider Notes (Signed)
CSN: [id number]     Arrival date & time 09/02/15  1211 History   First MD Initiated Contact with Patient 09/02/15 1510     Chief Complaint  Patient presents with  . Abdominal Pain  . Emesis     (Consider location/radiation/quality/duration/timing/severity/associated sxs/prior Treatment) HPI Barbette ReichmannJazmyn Johnson-Alston is a 25 y.o. female with a history of uncontrolled insulin-dependent diabetes, here for evaluation of abdominal pain and emesis. Patient reports yesterday she began to develop upper abdominal, sharp pain. She reports associated nausea with multiple episodes of nonbloody and nonbilious emesis. She reports she has not been taking any medications for her diabetes. Nothing makes this problem better or worse. No fevers, chills, diarrhea, constipation, urinary symptoms, numbness or weakness. No other modifying factors.  Past Medical History  Diagnosis Date  . Diabetes mellitus without complication (HCC)    History reviewed. No pertinent past surgical history. No family history on file. Social History  Substance Use Topics  . Smoking status: Former Games developermoker  . Smokeless tobacco: None  . Alcohol Use: Yes   OB History    No data available     Review of Systems A 10 point review of systems was completed and was negative except for pertinent positives and negatives as mentioned in the history of present illness     Allergies  Review of patient's allergies indicates no known allergies.  Home Medications   Prior to Admission medications   Medication Sig Start Date End Date Taking? Authorizing Provider  ibuprofen (ADVIL,MOTRIN) 600 MG tablet Take 1 tablet (600 mg total) by mouth every 6 (six) hours as needed. Patient taking differently: Take 600 mg by mouth every 6 (six) hours as needed for moderate pain.  07/12/15  Yes Shari Upstill, PA-C  insulin glargine (LANTUS) 100 UNIT/ML injection Inject 0.15 mLs (15 Units total) into the skin at bedtime. Patient not taking: Reported on  07/12/2015 06/15/12   Clemetine MarkerGreg Peters, MD   BP 140/101 mmHg  Pulse 128  Temp(Src) 99.4 F (37.4 C) (Oral)  Resp 20  Ht 5\' 8"  (1.727 m)  Wt 78.926 kg  BMI 26.46 kg/m2  SpO2 100% Physical Exam  Constitutional: She is oriented to person, place, and time. She appears well-developed and well-nourished.  Alert, oriented, uncomfortable appearing African-American female. Alert and oriented 4  HENT:  Head: Normocephalic and atraumatic.  Mouth/Throat: Oropharynx is clear and moist.  Eyes: Conjunctivae are normal. Pupils are equal, round, and reactive to light. Right eye exhibits no discharge. Left eye exhibits no discharge. No scleral icterus.  Neck: Neck supple.  Cardiovascular: Normal rate, regular rhythm and normal heart sounds.   Pulmonary/Chest: Effort normal and breath sounds normal. No respiratory distress. She has no wheezes. She has no rales.  Abdominal: Soft. There is tenderness.  Diffuse abdominal tenderness with palpation. No focal tenderness. No distention or peritoneal signs.  Musculoskeletal: She exhibits no tenderness.  Neurological: She is alert and oriented to person, place, and time.  Cranial Nerves II-XII grossly intact  Skin: Skin is warm and dry. No rash noted.  Psychiatric: She has a normal mood and affect.  Nursing note and vitals reviewed.   ED Course  Procedures (including critical care time) Labs Review Labs Reviewed  COMPREHENSIVE METABOLIC PANEL - Abnormal; Notable for the following:    Sodium 133 (*)    Chloride 96 (*)    Glucose, Bld 395 (*)    Total Bilirubin 1.9 (*)    All other components within normal limits  URINALYSIS, ROUTINE W REFLEX  MICROSCOPIC (NOT AT [facility]) - Abnormal; Notable for the following:    Specific Gravity, Urine 1.044 (*)    Glucose, UA >1000 (*)    Ketones, ur >80 (*)    All other components within normal limits  URINE MICROSCOPIC-ADD ON - Abnormal; Notable for the following:    Squamous Epithelial / LPF 0-5 (*)    Bacteria, UA MANY  (*)    All other components within normal limits  CBG MONITORING, ED - Abnormal; Notable for the following:    Glucose-Capillary 379 (*)    All other components within normal limits  LIPASE, BLOOD  CBC  HEMOGLOBIN A1C  BASIC METABOLIC PANEL  CBC  I-STAT BETA HCG BLOOD, ED ([facility], [facility], [facility] ONLY)    Imaging Review No results found. I have personally reviewed and evaluated these images and lab results as part of my medical decision-making.   EKG Interpretation None      MDM  Patient with uncontrolled insulin dependent diabetes here with hyperglycemia, abdominal pain, emesis. On arrival, she is tachycardic in the 110s. She does appear slightly dehydrated, given 2 L normal saline. Labs significant for hyperglycemia 379. Given 10 units of NovoLog. Labs are otherwise reassuring, no evidence of DKA. However, patient will benefit from medical admission for medical management of diabetes. Discussed with my attending, Dr. Deretha Emory, who agrees with plan. Final diagnoses:  Hyperglycemia        Joycie Peek, PA-C 09/02/15 1734  Vanetta Mulders, MD 09/03/15 [phone]

## 2015-09-03 ENCOUNTER — Telehealth: Payer: Self-pay

## 2015-09-03 ENCOUNTER — Observation Stay (HOSPITAL_COMMUNITY): Payer: BLUE CROSS/BLUE SHIELD

## 2015-09-03 DIAGNOSIS — E0800 Diabetes mellitus due to underlying condition with hyperosmolarity without nonketotic hyperglycemic-hyperosmolar coma (NKHHC): Secondary | ICD-10-CM | POA: Diagnosis not present

## 2015-09-03 DIAGNOSIS — G43A1 Cyclical vomiting, intractable: Secondary | ICD-10-CM | POA: Diagnosis not present

## 2015-09-03 DIAGNOSIS — R1013 Epigastric pain: Secondary | ICD-10-CM | POA: Diagnosis not present

## 2015-09-03 DIAGNOSIS — R739 Hyperglycemia, unspecified: Secondary | ICD-10-CM | POA: Diagnosis not present

## 2015-09-03 DIAGNOSIS — D72829 Elevated white blood cell count, unspecified: Secondary | ICD-10-CM | POA: Diagnosis not present

## 2015-09-03 DIAGNOSIS — R1011 Right upper quadrant pain: Secondary | ICD-10-CM | POA: Diagnosis not present

## 2015-09-03 DIAGNOSIS — G43A0 Cyclical vomiting, not intractable: Secondary | ICD-10-CM | POA: Diagnosis not present

## 2015-09-03 LAB — BASIC METABOLIC PANEL
ANION GAP: 9 (ref 5–15)
BUN: 13 mg/dL (ref 6–20)
CO2: 21 mmol/L — ABNORMAL LOW (ref 22–32)
Calcium: 8.3 mg/dL — ABNORMAL LOW (ref 8.9–10.3)
Chloride: 108 mmol/L (ref 101–111)
Creatinine, Ser: 0.71 mg/dL (ref 0.44–1.00)
Glucose, Bld: 224 mg/dL — ABNORMAL HIGH (ref 65–99)
POTASSIUM: 3.5 mmol/L (ref 3.5–5.1)
SODIUM: 138 mmol/L (ref 135–145)

## 2015-09-03 LAB — RAPID URINE DRUG SCREEN, HOSP PERFORMED
Amphetamines: NOT DETECTED
BENZODIAZEPINES: NOT DETECTED
Barbiturates: NOT DETECTED
COCAINE: NOT DETECTED
OPIATES: POSITIVE — AB
Tetrahydrocannabinol: NOT DETECTED

## 2015-09-03 LAB — CBC
HEMATOCRIT: 33.8 % — AB (ref 36.0–46.0)
HEMOGLOBIN: 11.2 g/dL — AB (ref 12.0–15.0)
MCH: 28.5 pg (ref 26.0–34.0)
MCHC: 33.1 g/dL (ref 30.0–36.0)
MCV: 86 fL (ref 78.0–100.0)
Platelets: 331 10*3/uL (ref 150–400)
RBC: 3.93 MIL/uL (ref 3.87–5.11)
RDW: 12.4 % (ref 11.5–15.5)
WBC: 13.9 10*3/uL — AB (ref 4.0–10.5)

## 2015-09-03 LAB — GLUCOSE, CAPILLARY
GLUCOSE-CAPILLARY: 185 mg/dL — AB (ref 65–99)
GLUCOSE-CAPILLARY: 196 mg/dL — AB (ref 65–99)
GLUCOSE-CAPILLARY: 228 mg/dL — AB (ref 65–99)
Glucose-Capillary: 150 mg/dL — ABNORMAL HIGH (ref 65–99)

## 2015-09-03 LAB — LIPASE, BLOOD: LIPASE: 12 U/L (ref 11–51)

## 2015-09-03 LAB — HEMOGLOBIN A1C
Hgb A1c MFr Bld: 14 % — ABNORMAL HIGH (ref 4.8–5.6)
MEAN PLASMA GLUCOSE: 355 mg/dL

## 2015-09-03 MED ORDER — SUCRALFATE 1 GM/10ML PO SUSP
1.0000 g | Freq: Three times a day (TID) | ORAL | Status: DC
  Administered 2015-09-03 – 2015-09-09 (×23): 1 g via ORAL
  Filled 2015-09-03 (×23): qty 10

## 2015-09-03 MED ORDER — PANTOPRAZOLE SODIUM 40 MG PO TBEC
40.0000 mg | DELAYED_RELEASE_TABLET | Freq: Every day | ORAL | Status: DC
  Administered 2015-09-03 – 2015-09-09 (×5): 40 mg via ORAL
  Filled 2015-09-03 (×5): qty 1

## 2015-09-03 MED ORDER — HYDROMORPHONE HCL 1 MG/ML IJ SOLN
1.0000 mg | Freq: Four times a day (QID) | INTRAMUSCULAR | Status: DC | PRN
  Administered 2015-09-03 – 2015-09-06 (×8): 1 mg via INTRAVENOUS
  Filled 2015-09-03 (×8): qty 1

## 2015-09-03 MED ORDER — OXYCODONE-ACETAMINOPHEN 5-325 MG PO TABS
2.0000 | ORAL_TABLET | Freq: Four times a day (QID) | ORAL | Status: DC | PRN
  Administered 2015-09-03 – 2015-09-04 (×2): 2 via ORAL
  Filled 2015-09-03 (×2): qty 2

## 2015-09-03 MED ORDER — INSULIN ASPART PROT & ASPART (70-30 MIX) 100 UNIT/ML ~~LOC~~ SUSP
15.0000 [IU] | Freq: Two times a day (BID) | SUBCUTANEOUS | Status: DC
  Administered 2015-09-03 (×2): 15 [IU] via SUBCUTANEOUS
  Filled 2015-09-03: qty 10

## 2015-09-03 MED ORDER — LIVING WELL WITH DIABETES BOOK
Freq: Once | Status: DC
  Filled 2015-09-03: qty 1

## 2015-09-03 NOTE — Telephone Encounter (Signed)
[facility] RN CM requesting [facility] and inquiring if the patient would be eligible for the Transitional Care Clinic (TCC).  The patient does not qualify for the TCC; but a hospital follow up appointment was scheduled with Dr Julien NordmannLangeland for 09/05/15 @ 0900 and the information was placed on the AVS.   Update provided to C. Royal, RN CM

## 2015-09-03 NOTE — Progress Notes (Signed)
Inpatient Diabetes Program Recommendations  AACE/ADA: New Consensus Statement on Inpatient Glycemic Control (2015)  Target Ranges:  Prepandial:   less than 140 mg/dL      Peak postprandial:   less than 180 mg/dL (1-2 hours)      Critically ill patients:  140 - 180 mg/dL   Results for Kelli Hudson, Kelli Hudson (MRN [id number]) as of 09/03/2015 12:47  Ref. Range 09/02/2015 12:22 09/02/2015 18:12 09/02/2015 18:50 09/02/2015 19:33 09/03/2015 07:52 09/03/2015 11:37  Glucose-Capillary Latest Ref Range: 65-99 mg/dL 782379 (H) 956254 (H) 213250 (H) 261 (H) 185 (H) 196 (H)    Admit with: Hyperglycemia  History: DM (diagnosed 2008)  Home DM Meds: None for 2 years (has taken Insulin and Metformin in the past)  Current Insulin Orders: 70/30 Insulin- 15 units bidwc      Novolog Sensitive Correction Scale/ SSI (0-9 units) TID AC      -Spoke with patient about her current A1c of 14%.  Explained what an A1c is and what it measures.  Reminded patient that her goal A1c is 7% or less per ADA standards to prevent both acute and long-term complications.  Explained to patient the extreme importance of good glucose control at home.  Encouraged patient to check her CBGs at least tid before meals at home and to record all CBGs in a logbook for her PCP or Endocrinologist to review.  -Asked patient several questions about her DM history.  Patient told me she was diagnosed in 2008.  [facility] when she was a Pediatric patient.  Stated she probably last saw Dr. Fransico MichaelBrennan back in 2011.  I inquired as to how she was getting her insulin and Metformin refilled since she left Dr. Juluis MireBrennan's practice.  Patient stated she lived with her Mom and her Mom got her Rxs filled for her.  Not sure which MD was refilling the Rxs for the patient.  Currently does not have a PCP nor an Endocrinologist.  Hasn't seen a doctor in years.  Stated she just tries to manage her diabetes with her  diet at home.  Works 8-12 hour shifts at her job and usually only eats one big meal a day at 8pm.  -Patient asked me why her sugars were still going up even though she wasn't eating.  Explained to patient that the liver produces glucose when a person does not eat and that patients can have elevated glucose levels and increased insulin resistance during times of stress and illness.  Reviewed signs and symptoms of hyperglycemia with patient and encouraged her to check her CBGs at least tid at home.  Also discussed with patient that the physician has started her on 70/30 insulin.  Explained what 70/30 insulin is and how it works, when to take, what to do on sick days, etc.  Encouraged patient to eat three meals a day and to call PCP office if she has consistently high or consistently low CBGs.  Patient stated she doesn't have a meter.  Will ask MD to please give pt a Rx for CBG meter at time of discharge.  -Have asked RN caring for patient to review insulin administration with patient to make sure pt is comfortable drawing up and giving injections.  Have also ordered RD consult for DM diet review and Living Well with DM booklet.     MD- At time of discharge:  1. Please consider restarting Metformin 500 mg bid for patient in addition to her insulin  2.  Please give pt a Rx for CBG meter- Order # [id number]     --Will follow patient during hospitalization--  Ambrose Finland RN, MSN, CDE Diabetes Coordinator Inpatient Glycemic Control Team Team Pager: [phone] (8a-5p)

## 2015-09-03 NOTE — Progress Notes (Addendum)
[facility] PROGRESS NOTE  Brigida Scotti ZDG:[id number] DOB: 1990-08-17 DOA: 09/02/2015   PCP: No PCP Per Patient     Assessment/Plan: Active Problems:   Uncontrolled diabetes mellitus (HCC)   Vomiting  Jacquie Lukes is a 25 y.o. female with a medical history significant for diabetes diagnosed in 2008.She hasn't had any metformin or insulin in at least two years due to cost. Patient began vomiting yesterday evening.Serum glucose 395, normal gap, bicarbonate 25 total bilirubin 1.9  Assessment and plan Uncontrolled diabetes mellitus (HCC).    Presents now with vomiting, hyperglycemia without DKA , hemoglobin A1c 14 Patient hydrated with IV fluids overnight and started on sliding scale insulin Will start insulin 70/30 with insulin teaching Diabetes coordinator consultation -IV Reglan Q6 -Zofran IV prn -SSI. Sensitive for now - not on insulin at home.    Abdominal pain Esophageal in origin secondary to nausea and vomiting for several days Start patient on PPI, Carafate IV Dilaudid Check lipase and liver function If no improvement consider CT scan of the abdomen and pelvis  Leukocytosis Likely reactive, obtain chest x-ray   DVT prophylaxsis Lovenox  Code Status:  Full code  Family Communication: Discussed in detail with the patient, all imaging results, lab results explained to the patient   Disposition Plan:  Anticipate discharge tomorrow      Consultants:  None  Procedures:  None  Antibiotics: Anti-infectives    None         HPI/Subjective: Complaining of 10 on 10 abdominal pain with radiation into the chest, not able to eat anything  Objective: Filed Vitals:   09/02/15 1854 09/02/15 1936 09/03/15 0506 09/03/15 0800  BP: 138/81 149/88 119/68 127/87  Pulse: 122 124 113 105  Temp: 98.9 F (37.2 C) 98.6 F (37 C) 98.4 F (36.9 C) 97.5 F (36.4 C)  TempSrc: Oral Oral Oral Oral  Resp: Height:  (1.727  m)     Weight: 78.3 kg (172 lb 9.9 oz) 78.3 kg (172 lb 9.9 oz)    SpO2: 100% 100% 100% 100%    Intake/Output Summary (Last 24 hours) at 09/03/15 0824 Last data filed at 09/03/15 0754  Gross per 24 hour  Intake 3262.5 ml  Output      0 ml  Net 3262.5 ml    Exam:  Examination:  General exam: Appears calm and comfortable  Respiratory system: Clear to auscultation. Respiratory effort normal. Cardiovascular system: S1 & S2 heard, RRR. No JVD, murmurs, rubs, gallops or clicks. No pedal edema. Gastrointestinal system: Abdomen is nondistended, soft and Diffusely  tender . No organomegaly or masses felt. Normal bowel sounds heard. Central nervous system: Alert and oriented. No focal neurological deficits. Extremities: Symmetric 5 x 5 power. Skin: No rashes, lesions or ulcers Psychiatry: Judgement and insight appear normal. Mood & affect appropriate.     Data Reviewed: I have personally reviewed following labs and imaging studies  Micro Results No results found for this or any previous visit (from the past 240 hour(s)).  Radiology Reports No results found.   CBC  Recent Labs Lab 09/02/15 1226 09/03/15 0327  WBC 7.2 13.9*  HGB 13.4 11.2*  HCT 38.4 33.8*  PLT 371 331  MCV 85.1 86.0  MCH 29.7 28.5  MCHC 34.9 33.1  RDW 12.2 12.4    Chemistries   Recent Labs Lab 09/02/15 1226 09/02/15 1913 09/03/15 0327  NA 133* 138 138  K 3.5 3.6 3.5  CL  96* 105 108  CO2 25 23 21*  GLUCOSE 395* 268* 224*  BUN 15 13 13   CREATININE 0.71 0.61 0.71  CALCIUM 9.9 8.5* 8.3*  AST 17  --   --   ALT 17  --   --   ALKPHOS 75  --   --   BILITOT 1.9*  --   --    ------------------------------------------------------------------------------------------------------------------ estimated creatinine clearance is 119.3 mL/min (by C-G formula based on Cr of 0.71). ------------------------------------------------------------------------------------------------------------------  Recent  Labs  09/02/15 1747  HGBA1C 14.0*   ------------------------------------------------------------------------------------------------------------------ No results for input(s): CHOL, HDL, LDLCALC, TRIG, CHOLHDL, LDLDIRECT in the last 72 hours. ------------------------------------------------------------------------------------------------------------------ No results for input(s): TSH, T4TOTAL, T3FREE, THYROIDAB in the last 72 hours.  Invalid input(s): FREET3 ------------------------------------------------------------------------------------------------------------------ No results for input(s): VITAMINB12, FOLATE, FERRITIN, TIBC, IRON, RETICCTPCT in the last 72 hours.  Coagulation profile No results for input(s): INR, PROTIME in the last 168 hours.  No results for input(s): DDIMER in the last 72 hours.  Cardiac Enzymes No results for input(s): CKMB, TROPONINI, MYOGLOBIN in the last 168 hours.  Invalid input(s): CK ------------------------------------------------------------------------------------------------------------------ Invalid input(s): POCBNP   CBG:  Recent Labs Lab 09/02/15 1222 09/02/15 1812 09/02/15 1850 09/02/15 1933 09/03/15 0752  GLUCAP 379* 254* 250* 261* 185*       Studies: No results found.    Lab Results  Component Value Date   HGBA1C 14.0* 09/02/2015   HGBA1C 13.7* 10/05/2009   Lab Results  Component Value Date   CREATININE 0.71 09/03/2015       Scheduled Meds: . enoxaparin (LOVENOX) injection  40 mg Subcutaneous Q24H  . insulin aspart  0-9 Units Subcutaneous TID WC  . insulin aspart protamine- aspart  15 Units Subcutaneous BID WC  . metoCLOPramide (REGLAN) injection  10 mg Intravenous Q6H   Continuous Infusions: . sodium chloride 125 mL/hr at 09/03/15 0620        Time spent: >30 MINS    [facility],Tamiko Leopard  [facility] Pager [phone]. If 7PM-7AM, please contact night-coverage at [email or web address], password [email or web address] 09/03/2015,  8:24 AM

## 2015-09-03 NOTE — Care Management Note (Signed)
Case Management Note  Patient Details  Name: Kelli Hudson MRN: [id number] Date of Birth: 11-07-1990  Subjective/Objective:         CM following for progression and d/c planning.            Action/Plan: 09/03/2015 Met with pt re hospital followup, pt has recent insurance, however has no PCP as yet, therefore this CM attempted to set up with [facility] as she will need followup asap following hospitalization.  With the assistance of Opal Sidles, [facility] case manager, we were able to schedule for followup at the wellness center for Wednesday, September 05, 2015  9am. Pt states that she sound be able to purchase her medication now that she has insurance, she confirmed that prior to this she had not been buying medications.   Expected Discharge Date:                 Expected Discharge Plan:  Home/Self Care  In-House Referral:  NA  Discharge planning Services  CM Consult, [facility]  Post Acute Care Choice:  NA Choice offered to:  NA  DME Arranged:    DME Agency:     HH Arranged:    HH Agency:     Status of Service:  In process, will continue to follow  If discussed at Long Length of Stay Meetings, dates discussed:    Additional Comments:  Adron Bene, RN 09/03/2015, 11:40 AM

## 2015-09-04 ENCOUNTER — Inpatient Hospital Stay (HOSPITAL_COMMUNITY): Payer: BLUE CROSS/BLUE SHIELD

## 2015-09-04 ENCOUNTER — Encounter (HOSPITAL_COMMUNITY): Payer: Self-pay | Admitting: Radiology

## 2015-09-04 DIAGNOSIS — D72829 Elevated white blood cell count, unspecified: Secondary | ICD-10-CM | POA: Diagnosis present

## 2015-09-04 DIAGNOSIS — Z801 Family history of malignant neoplasm of trachea, bronchus and lung: Secondary | ICD-10-CM | POA: Diagnosis not present

## 2015-09-04 DIAGNOSIS — E0800 Diabetes mellitus due to underlying condition with hyperosmolarity without nonketotic hyperglycemic-hyperosmolar coma (NKHHC): Secondary | ICD-10-CM | POA: Diagnosis not present

## 2015-09-04 DIAGNOSIS — R1033 Periumbilical pain: Secondary | ICD-10-CM | POA: Diagnosis present

## 2015-09-04 DIAGNOSIS — K5900 Constipation, unspecified: Secondary | ICD-10-CM | POA: Diagnosis not present

## 2015-09-04 DIAGNOSIS — Z818 Family history of other mental and behavioral disorders: Secondary | ICD-10-CM | POA: Diagnosis not present

## 2015-09-04 DIAGNOSIS — K2970 Gastritis, unspecified, without bleeding: Secondary | ICD-10-CM | POA: Diagnosis present

## 2015-09-04 DIAGNOSIS — G43A0 Cyclical vomiting, not intractable: Secondary | ICD-10-CM

## 2015-09-04 DIAGNOSIS — Z794 Long term (current) use of insulin: Secondary | ICD-10-CM | POA: Diagnosis not present

## 2015-09-04 DIAGNOSIS — K209 Esophagitis, unspecified: Secondary | ICD-10-CM | POA: Diagnosis present

## 2015-09-04 DIAGNOSIS — E870 Hyperosmolality and hypernatremia: Secondary | ICD-10-CM | POA: Diagnosis present

## 2015-09-04 DIAGNOSIS — R1011 Right upper quadrant pain: Secondary | ICD-10-CM | POA: Diagnosis not present

## 2015-09-04 DIAGNOSIS — R000 Tachycardia, unspecified: Secondary | ICD-10-CM | POA: Diagnosis present

## 2015-09-04 DIAGNOSIS — R1013 Epigastric pain: Secondary | ICD-10-CM | POA: Diagnosis not present

## 2015-09-04 DIAGNOSIS — R739 Hyperglycemia, unspecified: Secondary | ICD-10-CM | POA: Diagnosis not present

## 2015-09-04 DIAGNOSIS — E1165 Type 2 diabetes mellitus with hyperglycemia: Secondary | ICD-10-CM | POA: Diagnosis present

## 2015-09-04 DIAGNOSIS — E860 Dehydration: Secondary | ICD-10-CM | POA: Diagnosis present

## 2015-09-04 LAB — GLUCOSE, CAPILLARY
GLUCOSE-CAPILLARY: 73 mg/dL (ref 65–99)
Glucose-Capillary: 171 mg/dL — ABNORMAL HIGH (ref 65–99)
Glucose-Capillary: 159 mg/dL — ABNORMAL HIGH (ref 65–99)
Glucose-Capillary: 122 mg/dL — ABNORMAL HIGH (ref 65–99)

## 2015-09-04 LAB — COMPREHENSIVE METABOLIC PANEL
ALK PHOS: 58 U/L (ref 38–126)
ALT: 13 U/L — AB (ref 14–54)
AST: 15 U/L (ref 15–41)
Albumin: 3 g/dL — ABNORMAL LOW (ref 3.5–5.0)
Anion gap: 8 (ref 5–15)
BUN: 9 mg/dL (ref 6–20)
CALCIUM: 8.9 mg/dL (ref 8.9–10.3)
CHLORIDE: 106 mmol/L (ref 101–111)
CO2: 25 mmol/L (ref 22–32)
CREATININE: 0.53 mg/dL (ref 0.44–1.00)
GFR calc Af Amer: >60 mL/min (ref >60)
Glucose, Bld: 90 mg/dL (ref 65–99)
Potassium: 2.9 mmol/L — ABNORMAL LOW (ref 3.5–5.1)
SODIUM: 139 mmol/L (ref 135–145)
Total Bilirubin: 1.1 mg/dL (ref 0.3–1.2)
Total Protein: 6.1 g/dL — ABNORMAL LOW (ref 6.5–8.1)

## 2015-09-04 LAB — CBC
HCT: 35.3 % — ABNORMAL LOW (ref 36.0–46.0)
HEMOGLOBIN: 12.1 g/dL (ref 12.0–15.0)
MCH: 29.2 pg (ref 26.0–34.0)
MCHC: 34.3 g/dL (ref 30.0–36.0)
MCV: 85.3 fL (ref 78.0–100.0)
PLATELETS: 343 10*3/uL (ref 150–400)
RBC: 4.14 MIL/uL (ref 3.87–5.11)
RDW: 12.4 % (ref 11.5–15.5)
WBC: 11.1 10*3/uL — AB (ref 4.0–10.5)

## 2015-09-04 LAB — MAGNESIUM: Magnesium: 1.7 mg/dL (ref 1.7–2.4)

## 2015-09-04 MED ORDER — SODIUM CHLORIDE 0.9 % IV SOLN: INTRAVENOUS | Status: DC

## 2015-09-04 MED ORDER — SODIUM CHLORIDE 0.9 % IV SOLN
INTRAVENOUS | Status: DC
  Filled 2015-09-04 (×2): qty 1000

## 2015-09-04 MED ORDER — DIATRIZOATE MEGLUMINE & SODIUM 66-10 % PO SOLN
30.0000 mL | ORAL | Status: AC
Start: 2015-09-04 — End: 2015-09-04
  Administered 2015-09-04 (×2): 30 mL via ORAL
  Filled 2015-09-04: qty 30

## 2015-09-04 MED ORDER — INSULIN ASPART PROT & ASPART (70-30 MIX) 100 UNIT/ML ~~LOC~~ SUSP: 18.0000 [IU] | Freq: Two times a day (BID) | SUBCUTANEOUS | Status: DC

## 2015-09-04 MED ORDER — OXYCODONE-ACETAMINOPHEN 5-325 MG PO TABS
1.0000 | ORAL_TABLET | Freq: Four times a day (QID) | ORAL | Status: DC | PRN
  Filled 2015-09-04: qty 1

## 2015-09-04 MED ORDER — GLUCERNA SHAKE PO LIQD
237.0000 mL | Freq: Three times a day (TID) | ORAL | Status: DC
Start: 2015-09-04 — End: 2015-09-09
  Administered 2015-09-04 – 2015-09-09 (×2): 237 mL via ORAL

## 2015-09-04 MED ORDER — IOPAMIDOL (ISOVUE-300) INJECTION 61%
INTRAVENOUS | Status: AC
  Administered 2015-09-04: 100 mL
  Filled 2015-09-04: qty 100

## 2015-09-04 MED ORDER — POTASSIUM CHLORIDE CRYS ER 20 MEQ PO TBCR
40.0000 meq | EXTENDED_RELEASE_TABLET | Freq: Once | ORAL | Status: AC
  Administered 2015-09-04: 40 meq via ORAL
  Filled 2015-09-04: qty 2

## 2015-09-04 MED ORDER — INSULIN ASPART PROT & ASPART (70-30 MIX) 100 UNIT/ML ~~LOC~~ SUSP
18.0000 [IU] | Freq: Two times a day (BID) | SUBCUTANEOUS | Status: DC
  Administered 2015-09-04 – 2015-09-08 (×3): 18 [IU] via SUBCUTANEOUS

## 2015-09-04 MED ORDER — SODIUM CHLORIDE 0.9 % IV SOLN
INTRAVENOUS | Status: DC
  Administered 2015-09-04 – 2015-09-06 (×6): via INTRAVENOUS
  Filled 2015-09-04 (×14): qty 1000

## 2015-09-04 NOTE — Progress Notes (Signed)
[facility] PROGRESS NOTE  Mendy Chou ZOX:[id number] DOB: Nov 18, 1990 DOA: 09/02/2015   PCP: No PCP Per Patient     Assessment/Plan: Active Problems:   Uncontrolled diabetes mellitus (HCC)   Vomiting  Kelli Hudson is a 25 y.o. female with a medical history significant for diabetes diagnosed in 2008.She hasn't had any metformin or insulin in at least two years due to cost. Patient began vomiting yesterday evening.Serum glucose 395, normal gap, bicarbonate 25 total bilirubin 1.9  Assessment and plan Uncontrolled diabetes mellitus (HCC).    Presents now with vomiting, hyperglycemia without DKA , hemoglobin A1c 14 Patient hydrated with IV fluids overnight and started on sliding scale insulin Started insulin 70/30 with insulin teaching Diabetes coordinator consultation,Currently does not have a PCP nor an Endocrinologist. Hasn't seen a doctor in years -IV Reglan Q6 -Zofran IV prn -SSI. Sensitive for now - not on insulin at home.  Patient not a candidate for metformin with such high A1c, she will be discharged on insulin   Abdominal pain, possible gastroparesis Esophageal in origin secondary to nausea and vomiting for several days, however no improvement with   PPI, Carafate IV Dilaudid, IV Reglan Lipase and LFTs okay, Ordered CT scan of the abdomen and pelvis to further evaluate her abdominal pain  Leukocytosis Likely reactive, chest x-ray negative   DVT prophylaxsis Lovenox  Code Status:  Full code  Family Communication: Discussed in detail with the patient, all imaging results, lab results explained to the patient   Disposition Plan:  Anticipate discharge when symptoms improve      Consultants:  None  Procedures:  None  Antibiotics: Anti-infectives    None         HPI/Subjective: Complaining of 10 on 10 abdominal pain with radiation into the chest, not able to eat anything, burning sensation in her chest, requesting pain  medication  Objective: Filed Vitals:   09/03/15 1700 09/03/15 2030 09/04/15 0606 09/04/15 0920  BP: 154/100 102/61 150/100 134/88  Pulse: 111 107 122 107  Temp: 99 F (37.2 C) 98.5 F (36.9 C) 98.5 F (36.9 C) 98 F (36.7 C)  TempSrc: Oral Oral Oral Oral  Resp: 16 16 18 18   Height:      Weight:  74.2 kg (163 lb 9.3 oz)    SpO2: 99% 98% 99% 100%    Intake/Output Summary (Last 24 hours) at 09/04/15 1022 Last data filed at 09/04/15 0920  Gross per 24 hour  Intake 3875.42 ml  Output    500 ml  Net 3375.42 ml    Exam:  Examination:  General exam: Appears calm and comfortable  Respiratory system: Clear to auscultation. Respiratory effort normal. Cardiovascular system: S1 & S2 heard, RRR. No JVD, murmurs, rubs, gallops or clicks. No pedal edema. Gastrointestinal system: Abdomen is nondistended, soft and Diffusely  tender . No organomegaly or masses felt. Normal bowel sounds heard. Central nervous system: Alert and oriented. No focal neurological deficits. Extremities: Symmetric 5 x 5 power. Skin: No rashes, lesions or ulcers Psychiatry: Judgement and insight appear normal. Mood & affect appropriate.     Data Reviewed: I have personally reviewed following labs and imaging studies  Micro Results No results found for this or any previous visit (from the past 240 hour(s)).  Radiology Reports Dg Chest Port 1 View  09/03/2015  CLINICAL DATA:  Leukocytosis. EXAM: PORTABLE CHEST 1 VIEW COMPARISON:  No recent prior. FINDINGS: Mediastinum and hilar structures normal. Lungs are clear. Heart size normal. No  pleural effusion or pneumothorax. IMPRESSION: No acute cardiopulmonary disease. Electronically Signed   By: Maisie Fushomas  Register   On: 09/03/2015 09:02     CBC  Recent Labs Lab 09/02/15 1226 09/03/15 0327 09/04/15 0505  WBC 7.2 13.9* 11.1*  HGB 13.4 11.2* 12.1  HCT 38.4 33.8* 35.3*  PLT 371 331 343  MCV 85.1 86.0 85.3  MCH 29.7 28.5 29.2  MCHC 34.9 33.1 34.3  RDW 12.2  12.4 12.4    Chemistries   Recent Labs Lab 09/02/15 1226 09/02/15 1913 09/03/15 0327 09/04/15 0500 09/04/15 0505  NA 133* 138 138  --  139  K 3.5 3.6 3.5  --  2.9*  CL 96* 105 108  --  106  CO2 25 23 21*  --  25  GLUCOSE 395* 268* 224*  --  90  BUN 15 13 13   --  9  CREATININE 0.71 0.61 0.71  --  0.53  CALCIUM 9.9 8.5* 8.3*  --  8.9  MG  --   --   --  1.7  --   AST 17  --   --   --  15  ALT 17  --   --   --  13*  ALKPHOS 75  --   --   --  58  BILITOT 1.9*  --   --   --  1.1   ------------------------------------------------------------------------------------------------------------------ estimated creatinine clearance is 109.4 mL/min (by C-G formula based on Cr of 0.53). ------------------------------------------------------------------------------------------------------------------  Recent Labs  09/02/15 1747  HGBA1C 14.0*   ------------------------------------------------------------------------------------------------------------------ No results for input(s): CHOL, HDL, LDLCALC, TRIG, CHOLHDL, LDLDIRECT in the last 72 hours. ------------------------------------------------------------------------------------------------------------------ No results for input(s): TSH, T4TOTAL, T3FREE, THYROIDAB in the last 72 hours.  Invalid input(s): FREET3 ------------------------------------------------------------------------------------------------------------------ No results for input(s): VITAMINB12, FOLATE, FERRITIN, TIBC, IRON, RETICCTPCT in the last 72 hours.  Coagulation profile No results for input(s): INR, PROTIME in the last 168 hours.  No results for input(s): DDIMER in the last 72 hours.  Cardiac Enzymes No results for input(s): CKMB, TROPONINI, MYOGLOBIN in the last 168 hours.  Invalid input(s): CK ------------------------------------------------------------------------------------------------------------------ Invalid input(s): POCBNP   CBG:  Recent  Labs Lab 09/03/15 0752 09/03/15 1137 09/03/15 1656 09/03/15 2028 09/04/15 0735  GLUCAP 185* 196* 228* 150* 171*       Studies: Dg Chest Port 1 View  09/03/2015  CLINICAL DATA:  Leukocytosis. EXAM: PORTABLE CHEST 1 VIEW COMPARISON:  No recent prior. FINDINGS: Mediastinum and hilar structures normal. Lungs are clear. Heart size normal. No pleural effusion or pneumothorax. IMPRESSION: No acute cardiopulmonary disease. Electronically Signed   By: Maisie Fushomas  Register   On: 09/03/2015 09:02      Lab Results  Component Value Date   HGBA1C 14.0* 09/02/2015   HGBA1C 13.7* 10/05/2009   Lab Results  Component Value Date   CREATININE 0.53 09/04/2015       Scheduled Meds: . enoxaparin (LOVENOX) injection  40 mg Subcutaneous Q24H  . insulin aspart  0-9 Units Subcutaneous TID WC  . insulin aspart protamine- aspart  18 Units Subcutaneous BID WC  . living well with diabetes book   Does not apply Once  . metoCLOPramide (REGLAN) injection  10 mg Intravenous Q6H  . pantoprazole  40 mg Oral Daily  . sucralfate  1 g Oral TID WC & HS   Continuous Infusions: . 0.9 % sodium chloride with kcl          Time spent: >30 MINS    [facility],Bairon Klemann  [facility] Pager [phone].  If 7PM-7AM, please contact night-coverage at [email or web address], password [facility] 09/04/2015, 10:22 AM

## 2015-09-04 NOTE — Plan of Care (Signed)
Problem: Food- and Nutrition-Related Knowledge Deficit (NB-1.1) Goal: Nutrition education Formal process to instruct or train a patient/client in a skill or to impart knowledge to help patients/clients voluntarily manage or modify food choices and eating behavior to maintain or improve health.  Outcome: Completed/Met Date Met:  09/04/15  RD consulted for nutrition education regarding diabetes.     Lab Results  Component Value Date    HGBA1C 14.0* 09/02/2015    RD provided "Carbohydrate Counting for People with Diabetes" handout from the Academy of [employer] and Dietetics. Pt reports she is familiar with the diet. Provided list of carbohydrates and recommended serving sizes of common foods. Discussed importance of controlled and consistent carbohydrate intake throughout the day. Plans for pt to discharge on insulin. Discussed the importance of carbohydrate counting at meals for insulin coverage. Teach back method used.  Expect good compliance.  Corrin Parker, MS, RD, LDN Pager # [phone] After hours/ weekend pager # [phone]

## 2015-09-04 NOTE — Progress Notes (Signed)
Initial Nutrition Assessment  DOCUMENTATION CODES:   Not applicable  INTERVENTION:  Provide Glucerna Shake po TID, each supplement provides 220 kcal and 10 grams of protein.  Diabetic diet handout given and reviewed.  NUTRITION DIAGNOSIS:   Inadequate oral intake related to nausea, vomiting as evidenced by meal completion < 25%.  GOAL:   Patient will meet greater than or equal to 90% of their needs  MONITOR:   PO intake, Supplement acceptance, Weight trends, Labs, I & O's  REASON FOR ASSESSMENT:   Consult Diet education  ASSESSMENT:   25 y.o. female with a medical history significant for diabetes diagnosed in 2008.She hasn't had any metformin or insulin in at least two years due to cost. Patient began vomiting.Serum glucose 395, normal gap, bicarbonate 25 total bilirubin 1.9  Pt with 0-15% meal completion. Pt with ongoing nausea/vomiting. Pt reports eating well (usually 3 meals a day) prior to her onset of n/v symptoms. RD consulted for diet education. Pt familiar with a diabetic diet. Reviewed diet with patient. Towards end of discussion, pt became very nauseous. RD unable to obtain information about usual body weight. Unable to complete Nutrition-Focused physical exam at this time. RD to perform at next visit. RD to order Glucerna Shake.   Labs and medications reviewed.   Diet Order:  Diet Carb Modified Fluid consistency:: Thin; Room service appropriate?: Yes  Skin:  Reviewed, no issues  Last BM:  7/17  Height:   Ht Readings from Last 1 Encounters:  09/02/15 5\' 8"  (1.727 m)    Weight:   Wt Readings from Last 1 Encounters:  09/03/15 163 lb 9.3 oz (74.2 kg)    Ideal Body Weight:  63.6 kg  BMI:  Body mass index is 24.88 kg/(m^2).  Estimated Nutritional Needs:   Kcal:  1850-2050  Protein:  90-100 grams  Fluid:  1.9 - 2.1 L/day  EDUCATION NEEDS:   Education needs addressed  Roslyn SmilingStephanie Audree Schrecengost, MS, RD, LDN Pager # [phone] After hours/ weekend pager #  [phone]

## 2015-09-04 NOTE — Care Management (Signed)
At this time it is evident that this pt will not be able to keep her [facility] for 9am on Wednesday, September 05, 2015. This appointment has been cancelled so that the a failure to keep the appointment has not occurred and thus placed the pt in a position that would not allow her to schedule further appointments. This CM will call 78/19/2017 am to attempt to reschedule for Thur, 09/06/15 or Friday, September 07, 2015 depending upon pt condition overnight, as the pt has continued to have vomiting. Will continue to follow.  Johny Shockheryl Earley Grobe RN MPH, case manager, [phone]

## 2015-09-05 ENCOUNTER — Ambulatory Visit: Payer: BLUE CROSS/BLUE SHIELD | Admitting: Internal Medicine

## 2015-09-05 DIAGNOSIS — E0800 Diabetes mellitus due to underlying condition with hyperosmolarity without nonketotic hyperglycemic-hyperosmolar coma (NKHHC): Secondary | ICD-10-CM | POA: Insufficient documentation

## 2015-09-05 DIAGNOSIS — R1013 Epigastric pain: Secondary | ICD-10-CM

## 2015-09-05 DIAGNOSIS — R109 Unspecified abdominal pain: Secondary | ICD-10-CM | POA: Insufficient documentation

## 2015-09-05 LAB — GLUCOSE, CAPILLARY
GLUCOSE-CAPILLARY: 153 mg/dL — AB (ref 65–99)
GLUCOSE-CAPILLARY: 134 mg/dL — AB (ref 65–99)
Glucose-Capillary: 118 mg/dL — ABNORMAL HIGH (ref 65–99)
Glucose-Capillary: 194 mg/dL — ABNORMAL HIGH (ref 65–99)

## 2015-09-05 LAB — BASIC METABOLIC PANEL
Anion gap: 8 (ref 5–15)
BUN: 6 mg/dL (ref 6–20)
CALCIUM: 9 mg/dL (ref 8.9–10.3)
CO2: 25 mmol/L (ref 22–32)
CREATININE: 0.65 mg/dL (ref 0.44–1.00)
Chloride: 103 mmol/L (ref 101–111)
GFR calc non Af Amer: >60 mL/min (ref >60)
Glucose, Bld: 92 mg/dL (ref 65–99)
Potassium: 3.7 mmol/L (ref 3.5–5.1)
SODIUM: 136 mmol/L (ref 135–145)

## 2015-09-05 LAB — PREGNANCY, URINE: PREG TEST UR: NEGATIVE

## 2015-09-05 MED ORDER — BISACODYL 10 MG RE SUPP
10.0000 mg | Freq: Once | RECTAL | Status: AC
  Administered 2015-09-05: 10 mg via RECTAL
  Filled 2015-09-05: qty 1

## 2015-09-05 NOTE — Consult Note (Signed)
Referring Provider:  Dr. Jeanella Anton ([facility]) Primary Care Physician:  No PCP Per Patient Primary Gastroenterologist:  None (unassigned)  Reason for Consultation:  Nausea and vomiting  HPI: Kelli Hudson is a 25 y.o. female with a 9 year h/o diabetes (untreated for 2 years, Hgb A-1-C 14) admitted through the emergency room 3 days ago with a one-day history of nausea and vomiting, and with moderate metabolic disarray.  Interestingly, the patient, who works as a Production designer, theatre/television/film at a [employer], denies chronic nausea or food intolerance or anorexia or history of weight loss, so she does not have symptoms of chronic gastrointestinal dysmotility despite her 9 year history of diabetes, which appears to have been very poorly controlled.  The patient denies significant exposure to nonsteroidals (uses ibuprofen for a couple of days at the start of her menstrual cycle).  Since the symptoms began several days ago, even small amounts of food intake are associated with vomiting, which may be saliva, bile, or the food she has just eaten. However, her vomiting has not been entirely postprandial. She states that this evening, she a small amount of solid food and kept it down.   Past Medical History  Diagnosis Date  . Diabetes mellitus without complication (HCC)     History reviewed. No pertinent past surgical history.  Prior to Admission medications   Medication Sig Start Date End Date Taking? Authorizing Provider  ibuprofen (ADVIL,MOTRIN) 600 MG tablet Take 1 tablet (600 mg total) by mouth every 6 (six) hours as needed. Patient taking differently: Take 600 mg by mouth every 6 (six) hours as needed for moderate pain.  07/12/15  Yes Shari Upstill, PA-C  insulin glargine (LANTUS) 100 UNIT/ML injection Inject 0.15 mLs (15 Units total) into the skin at bedtime. Patient not taking: Reported on 07/12/2015 06/15/12   Clemetine Marker, MD    Current Facility-Administered Medications  Medication Dose  Route Frequency Provider Last Rate Last Dose  . acetaminophen (TYLENOL) tablet 650 mg  650 mg Oral Q6H PRN Meredith Pel, NP       Or  . acetaminophen (TYLENOL) suppository 650 mg  650 mg Rectal Q6H PRN Meredith Pel, NP      . bisacodyl (DULCOLAX) EC tablet 5 mg  5 mg Oral Daily PRN Meredith Pel, NP      . enoxaparin (LOVENOX) injection 40 mg  40 mg Subcutaneous Q24H Meredith Pel, NP   40 mg at 09/04/15 2223  . feeding supplement (GLUCERNA SHAKE) (GLUCERNA SHAKE) liquid 237 mL  237 mL Oral TID BM Richarda Overlie, MD   237 mL at 09/04/15 2223  . HYDROmorphone (DILAUDID) injection 1 mg  1 mg Intravenous Q6H PRN Richarda Overlie, MD   1 mg at 09/05/15 1523  . insulin aspart (novoLOG) injection 0-9 Units  0-9 Units Subcutaneous TID WC Meredith Pel, NP   2 Units at 09/05/15 1735  . insulin aspart protamine- aspart (NOVOLOG MIX 70/30) injection 18 Units  18 Units Subcutaneous BID WC Richarda Overlie, MD   18 Units at 09/04/15 1843  . living well with diabetes book MISC   Does not apply Once Richarda Overlie, MD      . metoCLOPramide (REGLAN) injection 10 mg  10 mg Intravenous Q6H Meredith Pel, NP   10 mg at 09/05/15 1733  . ondansetron (ZOFRAN) tablet 4 mg  4 mg Oral Q6H PRN Meredith Pel, NP       Or  . ondansetron [facility]) injection 4  mg  4 mg Intravenous Q6H PRN Meredith Pel, NP   4 mg at 09/05/15 1523  . oxyCODONE-acetaminophen (PERCOCET/ROXICET) 5-325 MG per tablet 1 tablet  1 tablet Oral Q6H PRN Richarda Overlie, MD      . pantoprazole (PROTONIX) EC tablet 40 mg  40 mg Oral Daily Richarda Overlie, MD   40 mg at 09/05/15 1025  . polyethylene glycol (MIRALAX / GLYCOLAX) packet 17 g  17 g Oral Daily PRN Meredith Pel, NP      . sodium chloride 0.9 % 1,000 mL with potassium chloride 60 mEq infusion   Intravenous Continuous Richarda Overlie, MD 125 mL/hr at 09/05/15 1240    . sucralfate (CARAFATE) 1 GM/10ML suspension 1 g  1 g Oral TID WC & HS Richarda Overlie, MD   1 g at 09/05/15 1732  . traZODone  (DESYREL) tablet 25 mg  25 mg Oral QHS PRN Meredith Pel, NP        Allergies as of 09/02/2015  . (No Known Allergies)    Family History  Problem Relation Age of Onset  . Lung cancer Mother   . Bipolar disorder Father     Social History   Social History  . Marital Status: Single    Spouse Name: N/A  . Number of Children: N/A  . Years of Education: N/A   Occupational History  . Not on file.   Social History Main Topics  . Smoking status: Former Games developer  . Smokeless tobacco: Not on file  . Alcohol Use: Yes  . Drug Use: No  . Sexual Activity: Not on file   Other Topics Concern  . Not on file   Social History Narrative    Review of Systems: Negative for chest pain, shortness of breath, skin problems, urinary problems, psychiatric problems, swollen lymph nodes, lower GI symptoms such as constipation or diarrhea.  Physical Exam: Vital signs in last 24 hours: Temp:  [98.1 F (36.7 C)-99.3 F (37.4 C)] 98.4 F (36.9 C) (07/19 1712) Pulse Rate:  [106-131] 119 (07/19 1712) Resp:  [20] 20 (07/19 1712) BP: (126-158)/(93-105) 133/93 mmHg (07/19 1712) SpO2:  [100 %] 100 % (07/19 1712) Last BM Date: 09/03/15 General:   Alert,  Well-developed, well-nourished, pleasant and cooperative in NAD. Does not appear at all chronically ill. Head:  Normocephalic and atraumatic. Eyes:  Sclera clear, no icterus.   Conjunctiva pink. Neck:   No masses or thyromegaly. Lungs:  Clear throughout to auscultation.   No wheezes, crackles, or rhonchi. No evident respiratory distress. Heart:   Rapid rate but regular rhythm; no murmurs, clicks, rubs,  or gallops. Abdomen:  Soft, nontender, nontympanitic, and nondistended. No masses, hepatosplenomegaly or ventral hernias noted. Absent bowel sounds, without bruits, guarding, or rebound.   Msk:   Symmetrical without gross deformities. Extremities:   Without edema. Neurologic:  Alert and coherent;  grossly normal neurologically. Skin:  Intact  without significant lesions or rashes. Tattoos present. Cervical Nodes:  No significant cervical adenopathy. Psych:   Alert and cooperative. Normal mood and affect.  Intake/Output from previous day: 07/18 0701 - 07/19 0700 In: 420 [P.O.:420] Out: 500 [Emesis/NG output:500] Intake/Output this shift:    Lab Results:  Recent Labs  09/03/15 0327 09/04/15 0505  WBC 13.9* 11.1*  HGB 11.2* 12.1  HCT 33.8* 35.3*  PLT 331 343   BMET  Recent Labs  09/03/15 0327 09/04/15 0505 09/05/15 0543  NA 138 139 136  K 3.5 2.9* 3.7  CL 108 106 103  CO2 21* 25 25  GLUCOSE 224* 90 92  BUN 13 9 6   CREATININE 0.71 0.53 0.65  CALCIUM 8.3* 8.9 9.0   LFT  Recent Labs  09/04/15 0505  PROT 6.1*  ALBUMIN 3.0*  AST 15  ALT 13*  ALKPHOS 58  BILITOT 1.1   PT/INR No results for input(s): LABPROT, INR in the last 72 hours.  Studies/Results: Ct Abdomen Pelvis W Contrast  09/04/2015  CLINICAL DATA:  25 year old diabetic female with abdominal pain and vomiting. Initial encounter. EXAM: CT ABDOMEN AND PELVIS WITH CONTRAST TECHNIQUE: Multidetector CT imaging of the abdomen and pelvis was performed using the standard protocol following bolus administration of intravenous contrast. CONTRAST:  1 ISOVUE-300 IOPAMIDOL (ISOVUE-300) INJECTION 61% COMPARISON:  None. FINDINGS: Lower chest:  Lung bases clear. Hepatobiliary: Liver within normal limits.  No calcified gallstones. Pancreas: No inflammation or mass. Spleen: Negative. Adrenals/Urinary Tract: No renal mass or evidence of renal collecting system obstruction. Adrenal glands unremarkable. Noncontrast filled views of the urinary bladder unremarkable. Stomach/Bowel: Under distended portions of bowel limit evaluation. The third portion duodenum is difficult to assess secondary to small caliber (which may reflect result of patient's habitus). Overall, no extra luminal bowel inflammatory process is noted. Specifically, no inflammation surrounds the appendix or  terminal ileum. No free intraperitoneal air or abnormal fluid collection. Vascular/Lymphatic: No vascular obstruction or adenopathy. Reproductive: Corpus luteum cysts. Other: Negative. Musculoskeletal: No worrisome abnormality. IMPRESSION: Under distended portions of bowel limit evaluation. The third portion duodenum is difficult to assess secondary to small caliber (which may reflect result of patient's habitus). Overall, no extra luminal bowel inflammatory process is noted. Specifically, no inflammation surrounds the appendix or terminal ileum. Electronically Signed   By: Lacy DuverneySteven  Olson M.D.   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 Less likely would be pyloric channel ulceration or chronic gastric dysmotility.  Plan: Frequent small aliquots of clear liquids (15 ML's every 30 minutes while awake). Reassess daily, and gradually advance day by day if tolerated. If this graduated gastroparesis diet is not tolerated, then the patient should probably have endoscopic evaluation, and possibly a gastric emptying scan. In my experience, patients with this sort of problem are usually eating solid food within 4 days.   LOS: 1 day   Alzena Gerber V  09/05/2015, 8:27 PM   Pager [phone] If no answer or after 5 PM call [phone]

## 2015-09-05 NOTE — Progress Notes (Addendum)
[facility] PROGRESS NOTE  Kelli Hudson WUJ:[id number] DOB: 08/05/1990 DOA: 09/02/2015   PCP: No PCP Per Patient     Assessment/Plan: Active Problems:   Uncontrolled diabetes mellitus (HCC)   Vomiting  Arnetha Silverthorne is a 25 y.o. female with a medical history significant for diabetes diagnosed in 2008.She hasn't had any metformin or insulin in at least two years due to cost. Patient began vomiting yesterday evening.Serum glucose 395, normal gap, bicarbonate 25 total bilirubin 1.9  Assessment and plan Uncontrolled diabetes mellitus (HCC).    Presents now with intractable nausea , vomiting, consuming <25% of her meals  hyperglycemia without DKA improved , hemoglobin A1c 14 Patient hydrated with IV fluids overnight and started on sliding scale insulin Continue insulin 70/30 with insulin teaching Diabetes coordinator consultation,Currently does not have a PCP nor an Endocrinologist. Hasn't seen a doctor in years -IV Reglan Q6 -Zofran IV prn Patient has been untreated for her diabetes for 2 years Patient not a candidate for metformin with such high A1c, she will be discharged on insulin   Abdominal pain, possible gastroparesis, intractable nausea vomiting Esophageal in origin secondary to nausea and vomiting for several days, however no improvement with   PPI, Carafate IV Dilaudid, IV Reglan Lipase and LFTs okay, CT abdomen pelvis shows under distended portions of the bowel. [facility] requested to consult as the patient has not had any symptomatic improvement with the above treatment Also trial of Dulcolax suppository to relieve constipation to see the nausea gets better  Leukocytosis Likely reactive, chest x-ray negative, UA negative   DVT prophylaxsis Lovenox  Code Status:  Full code  Family Communication: Discussed in detail with the patient, all imaging results, lab results explained to the patient   Disposition Plan:  Anticipate  discharge when symptoms improve      Consultants:  [facility]  Procedures:  None  Antibiotics: Anti-infectives    None         HPI/Subjective: Continues to have nausea and vomiting unable to eat  Objective: Filed Vitals:   09/04/15 0920 09/04/15 1717 09/04/15 2118 09/05/15 0430  BP: 134/88 153/104 155/96 126/96  Pulse: 107 121 131 106  Temp: 98 F (36.7 C) 99.3 F (37.4 C) 99.3 F (37.4 C) 98.1 F (36.7 C)  TempSrc: Oral Oral Oral Oral  Resp: Height:      Weight:      SpO2: 100% 100% 100% 100%    Intake/Output Summary (Last 24 hours) at 09/05/15 1035 Last data filed at 09/04/15 1850  Gross per 24 hour  Intake    300 ml  Output      0 ml  Net    300 ml    Exam:  Examination:  General exam: Appears calm and comfortable  Respiratory system: Clear to auscultation. Respiratory effort normal. Cardiovascular system: S1 & S2 heard, RRR. No JVD, murmurs, rubs, gallops or clicks. No pedal edema. Gastrointestinal system: Abdomen is nondistended, soft and Diffusely  tender . No organomegaly or masses felt. Normal bowel sounds heard. Central nervous system: Alert and oriented. No focal neurological deficits. Extremities: Symmetric 5 x 5 power. Skin: No rashes, lesions or ulcers Psychiatry: Judgement and insight appear normal. Mood & affect appropriate.     Data Reviewed: I have personally reviewed following labs and imaging studies  Micro Results No results found for this or any previous visit (from the past 240 hour(s)).  Radiology Reports Ct Abdomen Pelvis W Contrast  09/04/2015  CLINICAL DATA:  25 year old diabetic female with abdominal pain and vomiting. Initial encounter. EXAM: CT ABDOMEN AND PELVIS WITH CONTRAST TECHNIQUE: Multidetector CT imaging of the abdomen and pelvis was performed using the standard protocol following bolus administration of intravenous contrast. CONTRAST:  1 ISOVUE-300 IOPAMIDOL (ISOVUE-300) INJECTION  61% COMPARISON:  None. FINDINGS: Lower chest:  Lung bases clear. Hepatobiliary: Liver within normal limits.  No calcified gallstones. Pancreas: No inflammation or mass. Spleen: Negative. Adrenals/Urinary Tract: No renal mass or evidence of renal collecting system obstruction. Adrenal glands unremarkable. Noncontrast filled views of the urinary bladder unremarkable. Stomach/Bowel: Under distended portions of bowel limit evaluation. The third portion duodenum is difficult to assess secondary to small caliber (which may reflect result of patient's habitus). Overall, no extra luminal bowel inflammatory process is noted. Specifically, no inflammation surrounds the appendix or terminal ileum. No free intraperitoneal air or abnormal fluid collection. Vascular/Lymphatic: No vascular obstruction or adenopathy. Reproductive: Corpus luteum cysts. Other: Negative. Musculoskeletal: No worrisome abnormality. IMPRESSION: Under distended portions of bowel limit evaluation. The third portion duodenum is difficult to assess secondary to small caliber (which may reflect result of patient's habitus). Overall, no extra luminal bowel inflammatory process is noted. Specifically, no inflammation surrounds the appendix or terminal ileum. Electronically Signed   By: Lacy Duverney M.D.   On: 09/04/2015 18:30   Dg Chest Port 1 View  09/03/2015  CLINICAL DATA:  Leukocytosis. EXAM: PORTABLE CHEST 1 VIEW COMPARISON:  No recent prior. FINDINGS: Mediastinum and hilar structures normal. Lungs are clear. Heart size normal. No pleural effusion or pneumothorax. IMPRESSION: No acute cardiopulmonary disease. Electronically Signed   By: Maisie Fus  Register   On: 09/03/2015 09:02     CBC  Recent Labs Lab 09/02/15 1226 09/03/15 0327 09/04/15 0505  WBC 7.2 13.9* 11.1*  HGB 13.4 11.2* 12.1  HCT 38.4 33.8* 35.3*  PLT 371 331 343  MCV 85.1 86.0 85.3  MCH 29.7 28.5 29.2  MCHC 34.9 33.1 34.3  RDW 12.2 12.4 12.4    Chemistries   Recent  Labs Lab 09/02/15 1226 09/02/15 1913 09/03/15 0327 09/04/15 0500 09/04/15 0505 09/05/15 0543  NA 133* 138 138  --  139 136  K 3.5 3.6 3.5  --  2.9* 3.7  CL 96* 105 108  --  106 103  CO2 25 23 21*  --  25 25  GLUCOSE 395* 268* 224*  --  90 92  BUN 15 13 13   --  9 6  CREATININE 0.71 0.61 0.71  --  0.53 0.65  CALCIUM 9.9 8.5* 8.3*  --  8.9 9.0  MG  --   --   --  1.7  --   --   AST 17  --   --   --  15  --   ALT 17  --   --   --  13*  --   ALKPHOS 75  --   --   --  58  --   BILITOT 1.9*  --   --   --  1.1  --    ------------------------------------------------------------------------------------------------------------------ estimated creatinine clearance is 109.4 mL/min (by C-G formula based on Cr of 0.65). ------------------------------------------------------------------------------------------------------------------  Recent Labs  09/02/15 1747  HGBA1C 14.0*   ------------------------------------------------------------------------------------------------------------------ No results for input(s): CHOL, HDL, LDLCALC, TRIG, CHOLHDL, LDLDIRECT in the last 72 hours. ------------------------------------------------------------------------------------------------------------------ No results for input(s): TSH, T4TOTAL, T3FREE, THYROIDAB in the last 72 hours.  Invalid input(s): FREET3 ------------------------------------------------------------------------------------------------------------------ No results for input(s): VITAMINB12, FOLATE, FERRITIN, TIBC, IRON,  RETICCTPCT in the last 72 hours.  Coagulation profile No results for input(s): INR, PROTIME in the last 168 hours.  No results for input(s): DDIMER in the last 72 hours.  Cardiac Enzymes No results for input(s): CKMB, TROPONINI, MYOGLOBIN in the last 168 hours.  Invalid input(s): CK ------------------------------------------------------------------------------------------------------------------ Invalid input(s):  POCBNP   CBG:  Recent Labs Lab 09/04/15 0735 09/04/15 1226 09/04/15 1715 09/04/15 2127 09/05/15 0814  GLUCAP 171* 159* 122* 73 118*       Studies: Ct Abdomen Pelvis W Contrast  09/04/2015  CLINICAL DATA:  25 year old diabetic female with abdominal pain and vomiting. Initial encounter. EXAM: CT ABDOMEN AND PELVIS WITH CONTRAST TECHNIQUE: Multidetector CT imaging of the abdomen and pelvis was performed using the standard protocol following bolus administration of intravenous contrast. CONTRAST:  1 ISOVUE-300 IOPAMIDOL (ISOVUE-300) INJECTION 61% COMPARISON:  None. FINDINGS: Lower chest:  Lung bases clear. Hepatobiliary: Liver within normal limits.  No calcified gallstones. Pancreas: No inflammation or mass. Spleen: Negative. Adrenals/Urinary Tract: No renal mass or evidence of renal collecting system obstruction. Adrenal glands unremarkable. Noncontrast filled views of the urinary bladder unremarkable. Stomach/Bowel: Under distended portions of bowel limit evaluation. The third portion duodenum is difficult to assess secondary to small caliber (which may reflect result of patient's habitus). Overall, no extra luminal bowel inflammatory process is noted. Specifically, no inflammation surrounds the appendix or terminal ileum. No free intraperitoneal air or abnormal fluid collection. Vascular/Lymphatic: No vascular obstruction or adenopathy. Reproductive: Corpus luteum cysts. Other: Negative. Musculoskeletal: No worrisome abnormality. IMPRESSION: Under distended portions of bowel limit evaluation. The third portion duodenum is difficult to assess secondary to small caliber (which may reflect result of patient's habitus). Overall, no extra luminal bowel inflammatory process is noted. Specifically, no inflammation surrounds the appendix or terminal ileum. Electronically Signed   By: Lacy DuverneySteven  Olson M.D.   On: 09/04/2015 18:30      Lab Results  Component Value Date   HGBA1C 14.0* 09/02/2015   HGBA1C  13.7* 10/05/2009   Lab Results  Component Value Date   CREATININE 0.65 09/05/2015       Scheduled Meds: . enoxaparin (LOVENOX) injection  40 mg Subcutaneous Q24H  . feeding supplement (GLUCERNA SHAKE)  237 mL Oral TID BM  . insulin aspart  0-9 Units Subcutaneous TID WC  . insulin aspart protamine- aspart  18 Units Subcutaneous BID WC  . living well with diabetes book   Does not apply Once  . metoCLOPramide (REGLAN) injection  10 mg Intravenous Q6H  . pantoprazole  40 mg Oral Daily  . sucralfate  1 g Oral TID WC & HS   Continuous Infusions: . 0.9 % sodium chloride with kcl 125 mL/hr at 09/04/15 1934     LOS: 1 day    Time spent: >30 MINS    Kelli Hudson,Kelli Hudson  [facility] Pager [phone]. If 7PM-7AM, please contact night-coverage at [email or web address], password [email or web address] 09/05/2015, 10:35 AM  LOS: 1 day

## 2015-09-06 DIAGNOSIS — R739 Hyperglycemia, unspecified: Secondary | ICD-10-CM | POA: Insufficient documentation

## 2015-09-06 LAB — COMPREHENSIVE METABOLIC PANEL
ALBUMIN: 3.3 g/dL — AB (ref 3.5–5.0)
ALT: 13 U/L — AB (ref 14–54)
AST: 14 U/L — AB (ref 15–41)
Alkaline Phosphatase: 61 U/L (ref 38–126)
Anion gap: 8 (ref 5–15)
BILIRUBIN TOTAL: 1.6 mg/dL — AB (ref 0.3–1.2)
BUN: 8 mg/dL (ref 6–20)
CHLORIDE: 103 mmol/L (ref 101–111)
CO2: 23 mmol/L (ref 22–32)
CREATININE: 0.65 mg/dL (ref 0.44–1.00)
Calcium: 9.3 mg/dL (ref 8.9–10.3)
GFR calc Af Amer: >60 mL/min (ref >60)
GLUCOSE: 129 mg/dL — AB (ref 65–99)
Potassium: 4.3 mmol/L (ref 3.5–5.1)
Sodium: 134 mmol/L — ABNORMAL LOW (ref 135–145)
Total Protein: 6.8 g/dL (ref 6.5–8.1)

## 2015-09-06 LAB — CBC
HCT: 37.5 % (ref 36.0–46.0)
Hemoglobin: 13 g/dL (ref 12.0–15.0)
MCH: 29.2 pg (ref 26.0–34.0)
MCHC: 34.7 g/dL (ref 30.0–36.0)
MCV: 84.3 fL (ref 78.0–100.0)
Platelets: 357 10*3/uL (ref 150–400)
RBC: 4.45 MIL/uL (ref 3.87–5.11)
RDW: 12.5 % (ref 11.5–15.5)
WBC: 8.4 10*3/uL (ref 4.0–10.5)

## 2015-09-06 LAB — GLUCOSE, CAPILLARY
GLUCOSE-CAPILLARY: 141 mg/dL — AB (ref 65–99)
Glucose-Capillary: 203 mg/dL — ABNORMAL HIGH (ref 65–99)
Glucose-Capillary: 164 mg/dL — ABNORMAL HIGH (ref 65–99)
Glucose-Capillary: 140 mg/dL — ABNORMAL HIGH (ref 65–99)

## 2015-09-06 MED ORDER — LORAZEPAM 2 MG/ML IJ SOLN
1.0000 mg | Freq: Once | INTRAMUSCULAR | Status: AC
  Administered 2015-09-06: 1 mg via INTRAVENOUS
  Filled 2015-09-06: qty 1

## 2015-09-06 MED ORDER — PROMETHAZINE HCL 25 MG/ML IJ SOLN
12.5000 mg | INTRAMUSCULAR | Status: DC | PRN
  Administered 2015-09-06 – 2015-09-08 (×4): 12.5 mg via INTRAVENOUS
  Filled 2015-09-06 (×4): qty 1

## 2015-09-06 MED ORDER — BISACODYL 10 MG RE SUPP
10.0000 mg | Freq: Once | RECTAL | Status: AC
  Administered 2015-09-06: 10 mg via RECTAL
  Filled 2015-09-06: qty 1

## 2015-09-06 MED ORDER — SORBITOL 70 % SOLN
15.0000 mL | Freq: Every day | Status: DC
  Administered 2015-09-06 – 2015-09-09 (×3): 15 mL via ORAL
  Filled 2015-09-06 (×4): qty 30

## 2015-09-06 MED ORDER — ACETAMINOPHEN 325 MG PO TABS: 650.0000 mg | ORAL_TABLET | Freq: Four times a day (QID) | ORAL | Status: DC | PRN

## 2015-09-06 NOTE — Progress Notes (Signed)
[facility] PROGRESS NOTE  Kelli Hudson ONG:[id number] DOB: 11-28-1990 DOA: 09/02/2015   PCP: No PCP Per Patient     Assessment/Plan: Active Problems:   Uncontrolled diabetes mellitus (HCC)   Vomiting   Abdominal pain   Diabetes mellitus due to underlying condition, uncontrolled, with hyperosmolarity without coma, without long-term current use of insulin (HCC)  Kelli Hudson is a 25 y.o. female with a medical history significant for diabetes diagnosed in 2008.She hasn't had any metformin or insulin in at least two years due to cost. Patient began vomiting yesterday evening.Serum glucose 395, normal gap, bicarbonate 25 total bilirubin 1.9  Assessment and plan Uncontrolled diabetes mellitus (HCC).    Presents now with intractable nausea , vomiting, consuming <25% of her meals  hyperglycemia without DKA improved , hemoglobin A1c 14 Patient hydrated with IV fluids overnight and started on sliding scale insulin Continue insulin 70/30 with insulin , Accu-Chek stable Diabetes coordinator consultation,Currently does not have a PCP nor an Endocrinologist. Hasn't seen a doctor in years -IV Reglan Q6 -Zofran IV prn Patient has been untreated for her diabetes for 2 years Patient not a candidate for metformin with such high A1c, she will be discharged on insulin   Abdominal pain, possible gastroparesis, intractable nausea vomiting Esophageal in origin secondary to nausea and vomiting for several days, however no improvement with   PPI, Carafate  , IV Reglan. GI strongly recommends to discontinue all narcotics to help with gastric dysmotility Lipase and LFTs okay, CT abdomen pelvis shows under distended portions of the bowel. [facility] following as patient has not had any symptomatic improvement with the above treatment Also trial of Dulcolax suppository to relieve constipation to see the nausea gets better Reassess tomorrow for possible  EGD  Leukocytosis Likely reactive, chest x-ray negative, UA negative   DVT prophylaxsis Lovenox  Code Status:  Full code  Family Communication: Discussed in detail with the patient, all imaging results, lab results explained to the patient   Disposition Plan:  Anticipate discharge when symptoms improve      Consultants:  [facility]  Procedures:  None  Antibiotics: Anti-infectives    None         HPI/Subjective:  Continues to have nausea and vomiting and decreased by mouth intake  Objective: Filed Vitals:   09/05/15 1712 09/05/15 2144 09/06/15 0457 09/06/15 0900  BP: 133/93 153/104 125/86 112/82  Pulse: 119 117 106 121  Temp: 98.4 F (36.9 C) 98.4 F (36.9 C) 98.5 F (36.9 C) 98.9 F (37.2 C)  TempSrc: Oral Oral Oral Oral  Resp: Height:      Weight:  74 kg (163 lb 2.3 oz)    SpO2: 100% 100% 100% 100%    Intake/Output Summary (Last 24 hours) at 09/06/15 1412 Last data filed at 09/06/15 1000  Gross per 24 hour  Intake   3180 ml  Output    528 ml  Net   2652 ml    Exam:  Examination:  General exam: Appears calm and comfortable  Respiratory system: Clear to auscultation. Respiratory effort normal. Cardiovascular system: S1 & S2 heard, RRR. No JVD, murmurs, rubs, gallops or clicks. No pedal edema. Gastrointestinal system: Abdomen is nondistended, soft and Diffusely  tender . No organomegaly or masses felt. Normal bowel sounds heard. Central nervous system: Alert and oriented. No focal neurological deficits. Extremities: Symmetric 5 x 5 power. Skin: No rashes, lesions or ulcers Psychiatry: Judgement and insight appear normal.  Mood & affect appropriate.     Data Reviewed: I have personally reviewed following labs and imaging studies  Micro Results No results found for this or any previous visit (from the past 240 hour(s)).  Radiology Reports Ct Abdomen Pelvis W Contrast  09/04/2015  CLINICAL DATA:  25 year old  diabetic female with abdominal pain and vomiting. Initial encounter. EXAM: CT ABDOMEN AND PELVIS WITH CONTRAST TECHNIQUE: Multidetector CT imaging of the abdomen and pelvis was performed using the standard protocol following bolus administration of intravenous contrast. CONTRAST:  1 ISOVUE-300 IOPAMIDOL (ISOVUE-300) INJECTION 61% COMPARISON:  None. FINDINGS: Lower chest:  Lung bases clear. Hepatobiliary: Liver within normal limits.  No calcified gallstones. Pancreas: No inflammation or mass. Spleen: Negative. Adrenals/Urinary Tract: No renal mass or evidence of renal collecting system obstruction. Adrenal glands unremarkable. Noncontrast filled views of the urinary bladder unremarkable. Stomach/Bowel: Under distended portions of bowel limit evaluation. The third portion duodenum is difficult to assess secondary to small caliber (which may reflect result of patient's habitus). Overall, no extra luminal bowel inflammatory process is noted. Specifically, no inflammation surrounds the appendix or terminal ileum. No free intraperitoneal air or abnormal fluid collection. Vascular/Lymphatic: No vascular obstruction or adenopathy. Reproductive: Corpus luteum cysts. Other: Negative. Musculoskeletal: No worrisome abnormality. IMPRESSION: Under distended portions of bowel limit evaluation. The third portion duodenum is difficult to assess secondary to small caliber (which may reflect result of patient's habitus). Overall, no extra luminal bowel inflammatory process is noted. Specifically, no inflammation surrounds the appendix or terminal ileum. Electronically Signed   By: Lacy Duverney M.D.   On: 09/04/2015 18:30   Dg Chest Port 1 View  09/03/2015  CLINICAL DATA:  Leukocytosis. EXAM: PORTABLE CHEST 1 VIEW COMPARISON:  No recent prior. FINDINGS: Mediastinum and hilar structures normal. Lungs are clear. Heart size normal. No pleural effusion or pneumothorax. IMPRESSION: No acute cardiopulmonary disease. Electronically Signed    By: Maisie Fus  Register   On: 09/03/2015 09:02     CBC  Recent Labs Lab 09/02/15 1226 09/03/15 0327 09/04/15 0505 09/06/15 0643  WBC 7.2 13.9* 11.1* 8.4  HGB 13.4 11.2* 12.1 13.0  HCT 38.4 33.8* 35.3* 37.5  PLT 371 331 343 357  MCV 85.1 86.0 85.3 84.3  MCH 29.7 28.5 29.2 29.2  MCHC 34.9 33.1 34.3 34.7  RDW 12.2 12.4 12.4 12.5    Chemistries   Recent Labs Lab 09/02/15 1226 09/02/15 1913 09/03/15 0327 09/04/15 0500 09/04/15 0505 09/05/15 0543 09/06/15 0643  NA 133* 138 138  --  139 136 134*  K 3.5 3.6 3.5  --  2.9* 3.7 4.3  CL 96* 105 108  --  106 103 103  CO2 25 23 21*  --  25 25 23   GLUCOSE 395* 268* 224*  --  90 92 129*  BUN 15 13 13   --  9 6 8   CREATININE 0.71 0.61 0.71  --  0.53 0.65 0.65  CALCIUM 9.9 8.5* 8.3*  --  8.9 9.0 9.3  MG  --   --   --  1.7  --   --   --   AST 17  --   --   --  15  --  14*  ALT 17  --   --   --  13*  --  13*  ALKPHOS 75  --   --   --  58  --  61  BILITOT 1.9*  --   --   --  1.1  --  1.6*   ------------------------------------------------------------------------------------------------------------------  estimated creatinine clearance is 109.4 mL/min (by C-G formula based on Cr of 0.65). ------------------------------------------------------------------------------------------------------------------ No results for input(s): HGBA1C in the last 72 hours. ------------------------------------------------------------------------------------------------------------------ No results for input(s): CHOL, HDL, LDLCALC, TRIG, CHOLHDL, LDLDIRECT in the last 72 hours. ------------------------------------------------------------------------------------------------------------------ No results for input(s): TSH, T4TOTAL, T3FREE, THYROIDAB in the last 72 hours.  Invalid input(s): FREET3 ------------------------------------------------------------------------------------------------------------------ No results for input(s): VITAMINB12, FOLATE,  FERRITIN, TIBC, IRON, RETICCTPCT in the last 72 hours.  Coagulation profile No results for input(s): INR, PROTIME in the last 168 hours.  No results for input(s): DDIMER in the last 72 hours.  Cardiac Enzymes No results for input(s): CKMB, TROPONINI, MYOGLOBIN in the last 168 hours.  Invalid input(s): CK ------------------------------------------------------------------------------------------------------------------ Invalid input(s): POCBNP   CBG:  Recent Labs Lab 09/05/15 1142 09/05/15 1636 09/05/15 2143 09/06/15 0759 09/06/15 1156  GLUCAP 194* 153* 134* 141* 203*       Studies: Ct Abdomen Pelvis W Contrast  09/04/2015  CLINICAL DATA:  25 year old diabetic female with abdominal pain and vomiting. Initial encounter. EXAM: CT ABDOMEN AND PELVIS WITH CONTRAST TECHNIQUE: Multidetector CT imaging of the abdomen and pelvis was performed using the standard protocol following bolus administration of intravenous contrast. CONTRAST:  1 ISOVUE-300 IOPAMIDOL (ISOVUE-300) INJECTION 61% COMPARISON:  None. FINDINGS: Lower chest:  Lung bases clear. Hepatobiliary: Liver within normal limits.  No calcified gallstones. Pancreas: No inflammation or mass. Spleen: Negative. Adrenals/Urinary Tract: No renal mass or evidence of renal collecting system obstruction. Adrenal glands unremarkable. Noncontrast filled views of the urinary bladder unremarkable. Stomach/Bowel: Under distended portions of bowel limit evaluation. The third portion duodenum is difficult to assess secondary to small caliber (which may reflect result of patient's habitus). Overall, no extra luminal bowel inflammatory process is noted. Specifically, no inflammation surrounds the appendix or terminal ileum. No free intraperitoneal air or abnormal fluid collection. Vascular/Lymphatic: No vascular obstruction or adenopathy. Reproductive: Corpus luteum cysts. Other: Negative. Musculoskeletal: No worrisome abnormality. IMPRESSION: Under  distended portions of bowel limit evaluation. The third portion duodenum is difficult to assess secondary to small caliber (which may reflect result of patient's habitus). Overall, no extra luminal bowel inflammatory process is noted. Specifically, no inflammation surrounds the appendix or terminal ileum. Electronically Signed   By: Lacy DuverneySteven  Olson M.D.   On: 09/04/2015 18:30      Lab Results  Component Value Date   HGBA1C 14.0* 09/02/2015   HGBA1C 13.7* 10/05/2009   Lab Results  Component Value Date   CREATININE 0.65 09/06/2015       Scheduled Meds: . enoxaparin (LOVENOX) injection  40 mg Subcutaneous Q24H  . feeding supplement (GLUCERNA SHAKE)  237 mL Oral TID BM  . insulin aspart  0-9 Units Subcutaneous TID WC  . insulin aspart protamine- aspart  18 Units Subcutaneous BID WC  . living well with diabetes book   Does not apply Once  . metoCLOPramide (REGLAN) injection  10 mg Intravenous Q6H  . pantoprazole  40 mg Oral Daily  . sucralfate  1 g Oral TID WC & HS   Continuous Infusions: . 0.9 % sodium chloride with kcl 125 mL/hr at 09/06/15 0544     LOS: 2 days    Time spent: >30 MINS    [facility] InstituteBROL,Maurio Baize  [facility] Pager [phone]. If 7PM-7AM, please contact night-coverage at [email or web address], password [email or web address] 09/06/2015, 2:12 PM  LOS: 2 days

## 2015-09-06 NOTE — Progress Notes (Signed)
Pt got sick this morning after slt excess consumption of clr liq, and is now uncomfortable/nauseated.  Will reinforce compliance w/ trial of frequent SMALL aliquots of clr liq.  Would taper and possibly D/C narcotic analgesics, which are probably contributing to gastric dysmotility.  Will re-assess tomorrow.  Kelli Hudson, M.D. Pager [phone] If no answer or after 5 PM call [phone]

## 2015-09-06 NOTE — Progress Notes (Signed)
Patients heart rate accelerates into the 130's and 140's. Sustained in the 120's. Dr. Susie CassetteAbrol aware.

## 2015-09-07 ENCOUNTER — Inpatient Hospital Stay (HOSPITAL_COMMUNITY): Payer: BLUE CROSS/BLUE SHIELD

## 2015-09-07 ENCOUNTER — Encounter (HOSPITAL_COMMUNITY): Payer: Self-pay | Admitting: *Deleted

## 2015-09-07 DIAGNOSIS — R1011 Right upper quadrant pain: Secondary | ICD-10-CM

## 2015-09-07 LAB — COMPREHENSIVE METABOLIC PANEL
ALBUMIN: 3.4 g/dL — AB (ref 3.5–5.0)
ALT: 14 U/L (ref 14–54)
ANION GAP: 11 (ref 5–15)
AST: 16 U/L (ref 15–41)
Alkaline Phosphatase: 68 U/L (ref 38–126)
BILIRUBIN TOTAL: 1.7 mg/dL — AB (ref 0.3–1.2)
BUN: 8 mg/dL (ref 6–20)
CO2: 18 mmol/L — ABNORMAL LOW (ref 22–32)
Calcium: 9.4 mg/dL (ref 8.9–10.3)
Chloride: 104 mmol/L (ref 101–111)
Creatinine, Ser: 0.74 mg/dL (ref 0.44–1.00)
GFR calc Af Amer: >60 mL/min (ref >60)
Glucose, Bld: 171 mg/dL — ABNORMAL HIGH (ref 65–99)
POTASSIUM: 4.9 mmol/L (ref 3.5–5.1)
Sodium: 133 mmol/L — ABNORMAL LOW (ref 135–145)
TOTAL PROTEIN: 6.9 g/dL (ref 6.5–8.1)

## 2015-09-07 LAB — T4, FREE: Free T4: 1.33 ng/dL — ABNORMAL HIGH (ref 0.61–1.12)

## 2015-09-07 LAB — TSH: TSH: 0.543 u[IU]/mL (ref 0.350–4.500)

## 2015-09-07 LAB — GLUCOSE, CAPILLARY
GLUCOSE-CAPILLARY: 198 mg/dL — AB (ref 65–99)
Glucose-Capillary: 189 mg/dL — ABNORMAL HIGH (ref 65–99)
Glucose-Capillary: 150 mg/dL — ABNORMAL HIGH (ref 65–99)
Glucose-Capillary: 128 mg/dL — ABNORMAL HIGH (ref 65–99)

## 2015-09-07 LAB — LIPASE, BLOOD: LIPASE: 20 U/L (ref 11–51)

## 2015-09-07 MED ORDER — LORAZEPAM 2 MG/ML IJ SOLN: 1.0000 mg | Freq: Two times a day (BID) | INTRAMUSCULAR | Status: DC | PRN

## 2015-09-07 MED ORDER — BISACODYL 10 MG RE SUPP
10.0000 mg | Freq: Once | RECTAL | Status: AC
  Administered 2015-09-07: 10 mg via RECTAL
  Filled 2015-09-07: qty 1

## 2015-09-07 MED ORDER — IOPAMIDOL (ISOVUE-370) INJECTION 76%
INTRAVENOUS | Status: AC
  Administered 2015-09-07: 80 mL
  Filled 2015-09-07: qty 100

## 2015-09-07 MED ORDER — METOPROLOL TARTRATE 12.5 MG HALF TABLET
12.5000 mg | ORAL_TABLET | Freq: Two times a day (BID) | ORAL | Status: DC
  Administered 2015-09-07 – 2015-09-09 (×4): 12.5 mg via ORAL
  Filled 2015-09-07 (×5): qty 1

## 2015-09-07 MED ORDER — SODIUM CHLORIDE 0.9 % IV SOLN
INTRAVENOUS | Status: DC
  Administered 2015-09-07 – 2015-09-09 (×6): via INTRAVENOUS

## 2015-09-07 MED ORDER — TRAMADOL HCL 50 MG PO TABS: 50.0000 mg | ORAL_TABLET | Freq: Four times a day (QID) | ORAL | Status: DC | PRN

## 2015-09-07 MED ORDER — POLYETHYLENE GLYCOL 3350 17 G PO PACK
17.0000 g | PACK | Freq: Two times a day (BID) | ORAL | Status: DC
  Administered 2015-09-07 – 2015-09-09 (×4): 17 g via ORAL
  Filled 2015-09-07 (×4): qty 1

## 2015-09-07 MED ORDER — KETOROLAC TROMETHAMINE 15 MG/ML IJ SOLN
15.0000 mg | Freq: Four times a day (QID) | INTRAMUSCULAR | Status: DC | PRN
  Administered 2015-09-07: 15 mg via INTRAVENOUS
  Filled 2015-09-07: qty 1

## 2015-09-07 NOTE — Clinical Social Work Note (Signed)
CSW received consult regarding medication and confirming patient has insurance and help with a PCP.  CSW signing off as nurse case manager has talked with patient and [facility] Patient has insurance and can purchase her medications. CSW signing off. Please reconsult if needed.  Genelle BalVanessa Charina Fons, MSW, LCSW Licensed Clinical Social Worker Clinical Social Work Department [facility] [phone]

## 2015-09-07 NOTE — Progress Notes (Addendum)
[facility] PROGRESS NOTE  Kelli Hudson ZOX:[id number] DOB: 06-08-1990 DOA: 09/02/2015   PCP: No PCP Per Patient     Assessment/Plan: Active Problems:   Uncontrolled diabetes mellitus (HCC)   Vomiting   Abdominal pain   Diabetes mellitus due to underlying condition, uncontrolled, with hyperosmolarity without coma, without long-term current use of insulin (HCC)   Hyperglycemia     Kelli Hudson is a 25 y.o. female with a medical history significant for diabetes diagnosed in 2008.She hasn't had any metformin or insulin in at least two years due to cost. Patient began vomiting yesterday evening.Serum glucose 395, normal gap, bicarbonate 25 total bilirubin 1.9, on admission   Assessment and plan Uncontrolled diabetes mellitus (HCC).  Continues to have intractable nausea , vomiting, consuming <25% of her meals  hyperglycemia without DKA improved , hemoglobin A1c 14 Patient hydrated with IV fluids overnight and started on sliding scale insulin Continue insulin 70/30 with insulin , Accu-Chek stable Diabetes coordinator consultation,Currently does not have a PCP nor an Endocrinologist. Hasn't seen a doctor in years -IV Reglan Q6 -Zofran IV prn Patient has been untreated for her diabetes for 2 years Patient not a candidate for metformin with such high A1c, she will be discharged on insulin    Abdominal pain, possible gastroparesis, intractable nausea vomiting Continues to have nausea and vomiting for several days, however no improvement with   PPI, Carafate  , IV Reglan. GI strongly recommends to discontinue all narcotics to help with gastric dysmotility Lipase and LFTs okay, CT abdomen pelvis shows under distended portions of the bowel. [facility] following as patient has not had any symptomatic improvement with the above treatment, they recommend ultrasound of the right upper quadrant, possible EGD Also trial of Dulcolax suppository to  relieve constipation , patient has already received Dulcolax suppository without response Will start patient on MiraLAX Patient also getting Zofran and Phenergan along with Reglan for nausea   Leukocytosis Likely reactive, chest x-ray negative, UA negative  Sinus tachycardia-continues to complain of intermittent chest pain Does obtain CTA of the chest to rule out PE Check TSH, free T4, start patient on low-dose metoprolol   DVT prophylaxsis Lovenox  Code Status:  Full code  Family Communication: Discussed in detail with the patient, all imaging results, lab results explained to the patient   Disposition Plan:  Anticipate discharge when symptoms improve      Consultants:  [facility]  Procedures:  None  Antibiotics: Anti-infectives    None         HPI/Subjective: Continues to have intractable nausea vomiting   Objective: Filed Vitals:   09/06/15 1700 09/06/15 2152 09/07/15 0511 09/07/15 0826  BP: 143/89 116/76 115/70 127/87  Pulse: 120 138 123 131  Temp: 99.2 F (37.3 C) 98.4 F (36.9 C) 99 F (37.2 C) 98.8 F (37.1 C)  TempSrc: Oral Oral Oral Oral  Resp: Height:      Weight:  73.2 kg (161 lb 6 oz)    SpO2: 99% 98% 100% 99%    Intake/Output Summary (Last 24 hours) at 09/07/15 1120 Last data filed at 09/07/15 0900  Gross per 24 hour  Intake   1645 ml  Output      0 ml  Net   1645 ml    Exam:  Examination:  General exam: Appears calm and comfortable  Respiratory system: Clear to auscultation. Respiratory effort normal. Cardiovascular system: S1 & S2 heard, RRR. No JVD,  murmurs, rubs, gallops or clicks. No pedal edema. Gastrointestinal system: Abdomen is nondistended, soft and Diffusely  tender . No organomegaly or masses felt. Normal bowel sounds heard. Central nervous system: Alert and oriented. No focal neurological deficits. Extremities: Symmetric 5 x 5 power. Skin: No rashes, lesions or ulcers Psychiatry:  Judgement and insight appear normal. Mood & affect appropriate.     Data Reviewed: I have personally reviewed following labs and imaging studies  Micro Results No results found for this or any previous visit (from the past 240 hour(s)).  Radiology Reports Ct Abdomen Pelvis W Contrast  09/04/2015  CLINICAL DATA:  25 year old diabetic female with abdominal pain and vomiting. Initial encounter. EXAM: CT ABDOMEN AND PELVIS WITH CONTRAST TECHNIQUE: Multidetector CT imaging of the abdomen and pelvis was performed using the standard protocol following bolus administration of intravenous contrast. CONTRAST:  1 ISOVUE-300 IOPAMIDOL (ISOVUE-300) INJECTION 61% COMPARISON:  None. FINDINGS: Lower chest:  Lung bases clear. Hepatobiliary: Liver within normal limits.  No calcified gallstones. Pancreas: No inflammation or mass. Spleen: Negative. Adrenals/Urinary Tract: No renal mass or evidence of renal collecting system obstruction. Adrenal glands unremarkable. Noncontrast filled views of the urinary bladder unremarkable. Stomach/Bowel: Under distended portions of bowel limit evaluation. The third portion duodenum is difficult to assess secondary to small caliber (which may reflect result of patient's habitus). Overall, no extra luminal bowel inflammatory process is noted. Specifically, no inflammation surrounds the appendix or terminal ileum. No free intraperitoneal air or abnormal fluid collection. Vascular/Lymphatic: No vascular obstruction or adenopathy. Reproductive: Corpus luteum cysts. Other: Negative. Musculoskeletal: No worrisome abnormality. IMPRESSION: Under distended portions of bowel limit evaluation. The third portion duodenum is difficult to assess secondary to small caliber (which may reflect result of patient's habitus). Overall, no extra luminal bowel inflammatory process is noted. Specifically, no inflammation surrounds the appendix or terminal ileum. Electronically Signed   By: Lacy Duverney M.D.    On: 09/04/2015 18:30   Dg Chest Port 1 View  09/03/2015  CLINICAL DATA:  Leukocytosis. EXAM: PORTABLE CHEST 1 VIEW COMPARISON:  No recent prior. FINDINGS: Mediastinum and hilar structures normal. Lungs are clear. Heart size normal. No pleural effusion or pneumothorax. IMPRESSION: No acute cardiopulmonary disease. Electronically Signed   By: Maisie Fus  Register   On: 09/03/2015 09:02     CBC  Recent Labs Lab 09/02/15 1226 09/03/15 0327 09/04/15 0505 09/06/15 0643  WBC 7.2 13.9* 11.1* 8.4  HGB 13.4 11.2* 12.1 13.0  HCT 38.4 33.8* 35.3* 37.5  PLT 371 331 343 357  MCV 85.1 86.0 85.3 84.3  MCH 29.7 28.5 29.2 29.2  MCHC 34.9 33.1 34.3 34.7  RDW 12.2 12.4 12.4 12.5    Chemistries   Recent Labs Lab 09/02/15 1226  09/03/15 0327 09/04/15 0500 09/04/15 0505 09/05/15 0543 09/06/15 0643 09/07/15 0455  NA 133*  < > 138  --  139 136 134* 133*  K 3.5  < > 3.5  --  2.9* 3.7 4.3 4.9  CL 96*  < > 108  --  106 103 103 104  CO2 25  < > 21*  --  25 25 23  18*  GLUCOSE 395*  < > 224*  --  90 92 129* 171*  BUN 15  < > 13  --  9 6 8 8   CREATININE 0.71  < > 0.71  --  0.53 0.65 0.65 0.74  CALCIUM 9.9  < > 8.3*  --  8.9 9.0 9.3 9.4  MG  --   --   --  1.7  --   --   --   --   AST 17  --   --   --  15  --  14* 16  ALT 17  --   --   --  13*  --  13* 14  ALKPHOS 75  --   --   --  58  --  61 68  BILITOT 1.9*  --   --   --  1.1  --  1.6* 1.7*  < > = values in this interval not displayed. ------------------------------------------------------------------------------------------------------------------ estimated creatinine clearance is 109.4 mL/min (by C-G formula based on Cr of 0.74). ------------------------------------------------------------------------------------------------------------------ No results for input(s): HGBA1C in the last 72 hours. ------------------------------------------------------------------------------------------------------------------ No results for input(s): CHOL, HDL,  LDLCALC, TRIG, CHOLHDL, LDLDIRECT in the last 72 hours. ------------------------------------------------------------------------------------------------------------------ No results for input(s): TSH, T4TOTAL, T3FREE, THYROIDAB in the last 72 hours.  Invalid input(s): FREET3 ------------------------------------------------------------------------------------------------------------------ No results for input(s): VITAMINB12, FOLATE, FERRITIN, TIBC, IRON, RETICCTPCT in the last 72 hours.  Coagulation profile No results for input(s): INR, PROTIME in the last 168 hours.  No results for input(s): DDIMER in the last 72 hours.  Cardiac Enzymes No results for input(s): CKMB, TROPONINI, MYOGLOBIN in the last 168 hours.  Invalid input(s): CK ------------------------------------------------------------------------------------------------------------------ Invalid input(s): POCBNP   CBG:  Recent Labs Lab 09/06/15 0759 09/06/15 1156 09/06/15 1621 09/06/15 2151 09/07/15 0757  GLUCAP 141* 203* 164* 140* 198*       Studies: No results found.    Lab Results  Component Value Date   HGBA1C 14.0* 09/02/2015   HGBA1C 13.7* 10/05/2009   Lab Results  Component Value Date   CREATININE 0.74 09/07/2015       Scheduled Meds: . enoxaparin (LOVENOX) injection  40 mg Subcutaneous Q24H  . feeding supplement (GLUCERNA SHAKE)  237 mL Oral TID BM  . insulin aspart  0-9 Units Subcutaneous TID WC  . insulin aspart protamine- aspart  18 Units Subcutaneous BID WC  . living well with diabetes book   Does not apply Once  . metoCLOPramide (REGLAN) injection  10 mg Intravenous Q6H  . pantoprazole  40 mg Oral Daily  . sorbitol  15 mL Oral Daily  . sucralfate  1 g Oral TID WC & HS   Continuous Infusions: . 0.9 % sodium chloride with kcl 125 mL/hr at 09/06/15 2115     LOS: 3 days    Time spent: >30 MINS    [facility] CampusBROL,Kelli Hudson  [facility] Pager [phone]. If 7PM-7AM, please contact  night-coverage at [email or web address], password [email or web address] 09/07/2015, 11:20 AM  LOS: 3 days

## 2015-09-07 NOTE — Progress Notes (Signed)
Patient states that she feels hunger. Clear chicken brooth given which she vomited later. Phenergan iv given.

## 2015-09-07 NOTE — Progress Notes (Signed)
Kelli Hudson 10:12 AM  Subjective: Patient seen and examined and discussed with Dr. B and hospital computer chart reviewed and she is essentially the same not even trying to drink and generalized abdominal pain and she says she's never had this before but her mother had gallstones  Objective: Vital signs stable afebrile lying in the fetal position answering questions appropriately abdomen is soft rare bowel sound mild tenderness throughout without guarding or rebound except some increase in the midepigastric area chemistries okay  Assessment: Pain nausea vomiting question will etiology  Plan: Will proceed with an ultrasound today and if no better we will need an endoscopy soon and will ask  my partner to see this weekend  [facility] Center[facility],Rolfe Hartsell E  Pager [phone] After 5PM or if no answer call [phone]

## 2015-09-08 ENCOUNTER — Inpatient Hospital Stay (HOSPITAL_COMMUNITY): Payer: BLUE CROSS/BLUE SHIELD | Admitting: Anesthesiology

## 2015-09-08 ENCOUNTER — Encounter (HOSPITAL_COMMUNITY)
Admission: EM | Disposition: A | Discharge status: Home / Self Care | Payer: Self-pay | Source: Home / Self Care | Attending: Internal Medicine

## 2015-09-08 ENCOUNTER — Encounter (HOSPITAL_COMMUNITY): Payer: Self-pay

## 2015-09-08 HISTORY — PX: ESOPHAGOGASTRODUODENOSCOPY (EGD) WITH PROPOFOL: SHX5813

## 2015-09-08 LAB — CBC
HEMATOCRIT: 38.9 % (ref 36.0–46.0)
HEMOGLOBIN: 13.2 g/dL (ref 12.0–15.0)
MCH: 29.2 pg (ref 26.0–34.0)
MCHC: 33.9 g/dL (ref 30.0–36.0)
MCV: 86.1 fL (ref 78.0–100.0)
Platelets: 353 10*3/uL (ref 150–400)
RBC: 4.52 MIL/uL (ref 3.87–5.11)
RDW: 12.5 % (ref 11.5–15.5)
WBC: 8.9 10*3/uL (ref 4.0–10.5)

## 2015-09-08 LAB — COMPREHENSIVE METABOLIC PANEL
ALBUMIN: 3.3 g/dL — AB (ref 3.5–5.0)
ALK PHOS: 61 U/L (ref 38–126)
ALT: 15 U/L (ref 14–54)
ANION GAP: 12 (ref 5–15)
AST: 18 U/L (ref 15–41)
BILIRUBIN TOTAL: 1.6 mg/dL — AB (ref 0.3–1.2)
BUN: 9 mg/dL (ref 6–20)
CALCIUM: 9.5 mg/dL (ref 8.9–10.3)
CO2: 22 mmol/L (ref 22–32)
Chloride: 103 mmol/L (ref 101–111)
Creatinine, Ser: 0.74 mg/dL (ref 0.44–1.00)
Glucose, Bld: 148 mg/dL — ABNORMAL HIGH (ref 65–99)
POTASSIUM: 4 mmol/L (ref 3.5–5.1)
Sodium: 137 mmol/L (ref 135–145)
TOTAL PROTEIN: 6.6 g/dL (ref 6.5–8.1)

## 2015-09-08 LAB — GLUCOSE, CAPILLARY
GLUCOSE-CAPILLARY: 176 mg/dL — AB (ref 65–99)
GLUCOSE-CAPILLARY: 67 mg/dL (ref 65–99)
GLUCOSE-CAPILLARY: 112 mg/dL — AB (ref 65–99)
GLUCOSE-CAPILLARY: 83 mg/dL (ref 65–99)
Glucose-Capillary: 78 mg/dL (ref 65–99)

## 2015-09-08 SURGERY — ESOPHAGOGASTRODUODENOSCOPY (EGD) WITH PROPOFOL
Anesthesia: Monitor Anesthesia Care | Laterality: Left

## 2015-09-08 MED ORDER — LACTATED RINGERS IV SOLN
INTRAVENOUS | Status: DC | PRN
  Administered 2015-09-08: 11:00:00 via INTRAVENOUS

## 2015-09-08 MED ORDER — PROPOFOL 10 MG/ML IV BOLUS
INTRAVENOUS | Status: DC | PRN
  Administered 2015-09-08: 30 mg via INTRAVENOUS
  Administered 2015-09-08 (×4): 20 mg via INTRAVENOUS

## 2015-09-08 MED ORDER — ONDANSETRON HCL 4 MG/2ML IJ SOLN
INTRAMUSCULAR | Status: DC | PRN
  Administered 2015-09-08: 4 mg via INTRAVENOUS

## 2015-09-08 MED ORDER — INSULIN ASPART PROT & ASPART (70-30 MIX) 100 UNIT/ML ~~LOC~~ SUSP: 10.0000 [IU] | Freq: Two times a day (BID) | SUBCUTANEOUS | Status: DC

## 2015-09-08 MED ORDER — PROPOFOL 500 MG/50ML IV EMUL
INTRAVENOUS | Status: DC | PRN
Start: 2015-09-08 — End: 2015-09-08
  Administered 2015-09-08: 125 ug/kg/min via INTRAVENOUS

## 2015-09-08 MED ORDER — DEXTROSE 50 % IV SOLN
25.0000 mL | Freq: Once | INTRAVENOUS | Status: AC
  Administered 2015-09-08: 25 mL via INTRAVENOUS

## 2015-09-08 MED ORDER — FENTANYL CITRATE (PF) 100 MCG/2ML IJ SOLN
INTRAMUSCULAR | Status: DC | PRN
  Administered 2015-09-08 (×5): 50 ug via INTRAVENOUS

## 2015-09-08 MED ORDER — MIDAZOLAM HCL 5 MG/5ML IJ SOLN
INTRAMUSCULAR | Status: DC | PRN
  Administered 2015-09-08: 2 mg via INTRAVENOUS

## 2015-09-08 MED ORDER — DEXTROSE 50 % IV SOLN
INTRAVENOUS | Status: AC
  Administered 2015-09-08: 50 mL
  Filled 2015-09-08: qty 50

## 2015-09-08 NOTE — Progress Notes (Signed)
Subjective: Abdominal pain persists. Nausea and vomiting persists.  Objective: Vital signs in last 24 hours: Temp:  [98.2 F (36.8 C)-98.7 F (37.1 C)] 98.2 F (36.8 C) (07/22 0726) Pulse Rate:  [69-132] 132 (07/22 0726) Resp:  [18-20] 20 (07/22 0726) BP: (99-129)/(44-88) 112/78 mmHg (07/22 0726) SpO2:  [98 %-100 %] 99 % (07/22 0726) Weight:  [73.5 kg (162 lb 0.6 oz)] 73.5 kg (162 lb 0.6 oz) (07/21 2059) Weight change: 0.3 kg (10.6 oz) Last BM Date: 09/07/15  PE: GEN:  Thin-appearing, is nauseated and vomits while I'm in the room ABD:  Mild epigastric tenderness without peritonitis  Lab Results: CBC    Component Value Date/Time   WBC 8.9 09/08/2015 0449   RBC 4.52 09/08/2015 0449   HGB 13.2 09/08/2015 0449   HCT 38.9 09/08/2015 0449   PLT 353 09/08/2015 0449   MCV 86.1 09/08/2015 0449   MCH 29.2 09/08/2015 0449   MCHC 33.9 09/08/2015 0449   RDW 12.5 09/08/2015 0449   LYMPHSABS 2.5 06/15/2012 1857   MONOABS 0.4 06/15/2012 1857   EOSABS 0.1 06/15/2012 1857   BASOSABS 0.0 06/15/2012 1857   CMP     Component Value Date/Time   NA 137 09/08/2015 0449   K 4.0 09/08/2015 0449   CL 103 09/08/2015 0449   CO2 22 09/08/2015 0449   GLUCOSE 148* 09/08/2015 0449   BUN 9 09/08/2015 0449   CREATININE 0.74 09/08/2015 0449   CALCIUM 9.5 09/08/2015 0449   PROT 6.6 09/08/2015 0449   ALBUMIN 3.3* 09/08/2015 0449   AST 18 09/08/2015 0449   ALT 15 09/08/2015 0449   ALKPHOS 61 09/08/2015 0449   BILITOT 1.6* 09/08/2015 0449   GFRNONAA >60 09/08/2015 0449   GFRAA >60 09/08/2015 0449   Assessment:  1.  Epigastric pain.  Negative ultrasound, CT scan, labs. 2.  Nausea and vomiting.  History of diabetes. 3.  Uncontrolled diabetes, reason for admission, glucose values improving.  Plan:  1.  Endoscopy with anesthesia, hopefully tomorrow (if not tomorrow, then Monday). 2.  Continue scheduled empiric antiemetics. 3.  Continue PPI + sucralfate. 4.  Next step in management pending  endoscopy findings.   Kelli Hudson 09/08/2015, 8:41 AM   Pager [phone] If no answer or after 5 PM call [phone]

## 2015-09-08 NOTE — Anesthesia Postprocedure Evaluation (Signed)
Anesthesia Post Note  Patient: Network engineer  Procedure(s) Performed: Procedure(s) (LRB): ESOPHAGOGASTRODUODENOSCOPY (EGD) WITH PROPOFOL (Left)  Patient location during evaluation: Endoscopy Anesthesia Type: MAC Level of consciousness: awake and alert Pain management: pain level controlled Vital Signs Assessment: post-procedure vital signs reviewed and stable Respiratory status: spontaneous breathing, nonlabored ventilation, respiratory function stable and patient connected to nasal cannula oxygen Cardiovascular status: stable and blood pressure returned to baseline Anesthetic complications: no    Last Vitals:  Filed Vitals:   09/08/15 1210 09/08/15 1220  BP: 113/55 119/71  Pulse: 116 113  Temp:    Resp: 18 19    Last Pain:  Filed Vitals:   09/08/15 1225  PainSc: 10-Worst pain ever                 Shayley Medlin,JAMES TERRILL

## 2015-09-08 NOTE — Op Note (Signed)
[facility] Patient Name: Kelli Hudson Procedure Date : 09/08/2015 MRN: [id number] Attending MD: Willis Modena , MD Date of Birth: 1990/07/27 CSN: [id number] Age: 25 Admit Type: Inpatient Procedure:                Upper GI endoscopy Indications:              Epigastric abdominal pain, Periumbilical abdominal                            pain, Failure to respond to medical treatment,                            Nausea with vomiting Providers:                Willis Modena, MD, Anthony Sar, RN, Jacquiline Doe, RN, Kandice Robinsons, Technician Referring MD:              Medicines:                Monitored Anesthesia Care Complications:            No immediate complications. Estimated Blood Loss:     Estimated blood loss: none. Procedure:                Pre-Anesthesia Assessment:                           - Prior to the procedure, a History and Physical                            was performed, and patient medications and                            allergies were reviewed. The patient's tolerance of                            previous anesthesia was also reviewed. The risks                            and benefits of the procedure and the sedation                            options and risks were discussed with the patient.                            All questions were answered, and informed consent                            was obtained. Prior Anticoagulants: The patient has                            taken no previous anticoagulant or antiplatelet  agents. ASA Grade Assessment: II - A patient with                            mild systemic disease. After reviewing the risks                            and benefits, the patient was deemed in                            satisfactory condition to undergo the procedure.                           After obtaining informed consent, the endoscope was    passed under direct vision. Throughout the                            procedure, the patient's blood pressure, pulse, and                            oxygen saturations were monitored continuously. The                            was introduced through the mouth, and advanced to                            the second part of duodenum. The upper GI endoscopy                            was accomplished without difficulty. The patient                            tolerated the procedure well. Scope In: Scope Out: Findings:      LA Grade B (one or more mucosal breaks greater than 5 mm, not extending       between the tops of two mucosal folds) esophagitis was found.      The exam of the esophagus was otherwise normal.      Patchy moderate inflammation characterized by congestion (edema) and       erythema was found in the cardia and in the gastric fundus. Biopsies       were taken with a cold forceps for histology.      Small amount of retained gastric contents. The exam of the stomach and       pylorus was otherwise normal. No evidence of gatric outlet obstruction       was noted.      The duodenal bulb, first portion of the duodenum and second portion of       the duodenum were normal. Impression:               - LA Grade B esophagitis.                           - Gastritis. Biopsied.                           - Normal duodenal bulb, first  portion of the                            duodenum and second portion of the duodenum.                           - Suspect gastritis and esophagitis are function of                            the patient's recurrent nausea and vomiting, and                            less convinced that they are in any way linked                            causally to her symptoms. Moderate Sedation:      None Recommendation:           - Return patient to hospital ward for ongoing care.                           - Gastroparesis diet today.                           -  Continue present medications (PPI, sucralfate,                            scheduled IV antiemetics).                           - If no improvement over the next 1-2 days, would                            consider gastric emptying study on Monday.                           - Await pathology results.                           [facility] will follow. Procedure Code(s):        --- Professional ---                           [phone], Esophagogastroduodenoscopy, flexible,                            transoral; with biopsy, single or multiple Diagnosis Code(s):        --- Professional ---                           K20.9, Esophagitis, unspecified                           K29.70, Gastritis, unspecified, without bleeding                           R10.13, Epigastric pain  R10.33, Periumbilical pain                           R11.2, Nausea with vomiting, unspecified CPT copyright 2016 [employer]. All rights reserved. The codes documented in this report are preliminary and upon coder review may  be revised to meet current compliance requirements. Willis Modena, MD 09/08/2015 12:06:56 PM This report has been signed electronically. Number of Addenda: 0

## 2015-09-08 NOTE — H&P (View-Only) (Signed)
Referring Provider:  Dr. N. Abrol ([facility]) Primary Care Physician:  No PCP Per Patient Primary Gastroenterologist:  None (unassigned)  Reason for Consultation:  Nausea and vomiting  HPI: Kelli Hudson is a 25 y.o. female with a 9 year h/o diabetes (untreated for 2 years, Hgb A-1-C 14) admitted through the emergency room 3 days ago with a one-day history of nausea and vomiting, and with moderate metabolic disarray.  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  The patient denies significant exposure to nonsteroidals (uses ibuprofen for a couple of days at the start of her menstrual cycle).  Since the symptoms began several days ago, even small amounts of food intake are associated with vomiting, which may be saliva, bile, or the food she has just eaten. However, her vomiting has not been entirely postprandial. She states that this evening, she a small amount of solid food and kept it down.   Past Medical History  Diagnosis Date  . Diabetes mellitus without complication (HCC)     History reviewed. No pertinent past surgical history.  Prior to Admission medications   Medication Sig Start Date End Date Taking? Authorizing Provider  ibuprofen (ADVIL,MOTRIN) 600 MG tablet Take 1 tablet (600 mg total) by mouth every 6 (six) hours as needed. Patient taking differently: Take 600 mg by mouth every 6 (six) hours as needed for moderate pain.  07/12/15  Yes Shari Upstill, PA-C  insulin glargine (LANTUS) 100 UNIT/ML injection Inject 0.15 mLs (15 Units total) into the skin at bedtime. Patient not taking: Reported on 07/12/2015 06/15/12   Greg Peters, MD    Current Facility-Administered Medications  Medication Dose  Route Frequency Provider Last Rate Last Dose  . acetaminophen (TYLENOL) tablet 650 mg  650 mg Oral Q6H PRN Paula M Guenther, NP       Or  . acetaminophen (TYLENOL) suppository 650 mg  650 mg Rectal Q6H PRN Paula M Guenther, NP      . bisacodyl (DULCOLAX) EC tablet 5 mg  5 mg Oral Daily PRN Paula M Guenther, NP      . enoxaparin (LOVENOX) injection 40 mg  40 mg Subcutaneous Q24H Paula M Guenther, NP   40 mg at 09/04/15 2223  . feeding supplement (GLUCERNA SHAKE) (GLUCERNA SHAKE) liquid 237 mL  237 mL Oral TID BM Nayana Abrol, MD   237 mL at 09/04/15 2223  . HYDROmorphone (DILAUDID) injection 1 mg  1 mg Intravenous Q6H PRN Nayana Abrol, MD   1 mg at 09/05/15 1523  . insulin aspart (novoLOG) injection 0-9 Units  0-9 Units Subcutaneous TID WC Paula M Guenther, NP   2 Units at 09/05/15 1735  . insulin aspart protamine- aspart (NOVOLOG MIX 70/30) injection 18 Units  18 Units Subcutaneous BID WC Nayana Abrol, MD   18 Units at 09/04/15 1843  . living well with diabetes book MISC   Does not apply Once Nayana Abrol, MD      . metoCLOPramide (REGLAN) injection 10 mg  10 mg Intravenous Q6H Paula M Guenther, NP   10 mg at 09/05/15 1733  . ondansetron (ZOFRAN) tablet 4 mg  4 mg Oral Q6H PRN Paula M Guenther, NP       Or  . ondansetron (ZOFRAN) injection 4   mg  4 mg Intravenous Q6H PRN Meredith Pel, NP   4 mg at 09/05/15 1523  . oxyCODONE-acetaminophen (PERCOCET/ROXICET) 5-325 MG per tablet 1 tablet  1 tablet Oral Q6H PRN Richarda Overlie, MD      . pantoprazole (PROTONIX) EC tablet 40 mg  40 mg Oral Daily Richarda Overlie, MD   40 mg at 09/05/15 1025  . polyethylene glycol (MIRALAX / GLYCOLAX) packet 17 g  17 g Oral Daily PRN Meredith Pel, NP      . sodium chloride 0.9 % 1,000 mL with potassium chloride 60 mEq infusion   Intravenous Continuous Richarda Overlie, MD 125 mL/hr at 09/05/15 1240    . sucralfate (CARAFATE) 1 GM/10ML suspension 1 g  1 g Oral TID WC & HS Richarda Overlie, MD   1 g at 09/05/15 1732  . traZODone  (DESYREL) tablet 25 mg  25 mg Oral QHS PRN Meredith Pel, NP        Allergies as of 09/02/2015  . (No Known Allergies)    Family History  Problem Relation Age of Onset  . Lung cancer Mother   . Bipolar disorder Father     Social History   Social History  . Marital Status: Single    Spouse Name: N/A  . Number of Children: N/A  . Years of Education: N/A   Occupational History  . Not on file.   Social History Main Topics  . Smoking status: Former Games developer  . Smokeless tobacco: Not on file  . Alcohol Use: Yes  . Drug Use: No  . Sexual Activity: Not on file   Other Topics Concern  . Not on file   Social History Narrative    Review of Systems: Negative for chest pain, shortness of breath, skin problems, urinary problems, psychiatric problems, swollen lymph nodes, lower GI symptoms such as constipation or diarrhea.  Physical Exam: Vital signs in last 24 hours: Temp:  [98.1 F (36.7 C)-99.3 F (37.4 C)] 98.4 F (36.9 C) (07/19 1712) Pulse Rate:  [106-131] 119 (07/19 1712) Resp:  [20] 20 (07/19 1712) BP: (126-158)/(93-105) 133/93 mmHg (07/19 1712) SpO2:  [100 %] 100 % (07/19 1712) Last BM Date: 09/03/15 General:   Alert,  Well-developed, well-nourished, pleasant and cooperative in NAD. Does not appear at all chronically ill. Head:  Normocephalic and atraumatic. Eyes:  Sclera clear, no icterus.   Conjunctiva pink. Neck:   No masses or thyromegaly. Lungs:  Clear throughout to auscultation.   No wheezes, crackles, or rhonchi. No evident respiratory distress. Heart:   Rapid rate but regular rhythm; no murmurs, clicks, rubs,  or gallops. Abdomen:  Soft, nontender, nontympanitic, and nondistended. No masses, hepatosplenomegaly or ventral hernias noted. Absent bowel sounds, without bruits, guarding, or rebound.   Msk:   Symmetrical without gross deformities. Extremities:   Without edema. Neurologic:  Alert and coherent;  grossly normal neurologically. Skin:  Intact  without significant lesions or rashes. Tattoos present. Cervical Nodes:  No significant cervical adenopathy. Psych:   Alert and cooperative. Normal mood and affect.  Intake/Output from previous day: 07/18 0701 - 07/19 0700 In: 420 [P.O.:420] Out: 500 [Emesis/NG output:500] Intake/Output this shift:    Lab Results:  Recent Labs  09/03/15 0327 09/04/15 0505  WBC 13.9* 11.1*  HGB 11.2* 12.1  HCT 33.8* 35.3*  PLT 331 343   BMET  Recent Labs  09/03/15 0327 09/04/15 0505 09/05/15 0543  NA 138 139 136  K 3.5 2.9* 3.7  CL 108 106 103  CO2 21* 25 25  GLUCOSE 224* 90 92  BUN 13 9 6   CREATININE 0.71 0.53 0.65  CALCIUM 8.3* 8.9 9.0   LFT  Recent Labs  09/04/15 0505  PROT 6.1*  ALBUMIN 3.0*  AST 15  ALT 13*  ALKPHOS 58  BILITOT 1.1   PT/INR No results for input(s): LABPROT, INR in the last 72 hours.  Studies/Results: Ct Abdomen Pelvis W Contrast  09/04/2015  CLINICAL DATA:  25 year old diabetic female with abdominal pain and vomiting. Initial encounter. EXAM: CT ABDOMEN AND PELVIS WITH CONTRAST TECHNIQUE: Multidetector CT imaging of the abdomen and pelvis was performed using the standard protocol following bolus administration of intravenous contrast. CONTRAST:  1 ISOVUE-300 IOPAMIDOL (ISOVUE-300) INJECTION 61% COMPARISON:  None. FINDINGS: Lower chest:  Lung bases clear. Hepatobiliary: Liver within normal limits.  No calcified gallstones. Pancreas: No inflammation or mass. Spleen: Negative. Adrenals/Urinary Tract: No renal mass or evidence of renal collecting system obstruction. Adrenal glands unremarkable. Noncontrast filled views of the urinary bladder unremarkable. Stomach/Bowel: Under distended portions of bowel limit evaluation. The third portion duodenum is difficult to assess secondary to small caliber (which may reflect result of patient's habitus). Overall, no extra luminal bowel inflammatory process is noted. Specifically, no inflammation surrounds the appendix or  terminal ileum. No free intraperitoneal air or abnormal fluid collection. Vascular/Lymphatic: No vascular obstruction or adenopathy. Reproductive: Corpus luteum cysts. Other: Negative. Musculoskeletal: No worrisome abnormality. IMPRESSION: Under distended portions of bowel limit evaluation. The third portion duodenum is difficult to assess secondary to small caliber (which may reflect result of patient's habitus). Overall, no extra luminal bowel inflammatory process is noted. Specifically, no inflammation surrounds the appendix or terminal ileum. Electronically Signed   By: Lacy DuverneySteven  Olson M.D.   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 Less likely would be pyloric channel ulceration or chronic gastric dysmotility.  Plan: Frequent small aliquots of clear liquids (15 ML's every 30 minutes while awake). Reassess daily, and gradually advance day by day if tolerated. If this graduated gastroparesis diet is not tolerated, then the patient should probably have endoscopic evaluation, and possibly a gastric emptying scan. In my experience, patients with this sort of problem are usually eating solid food within 4 days.   LOS: 1 day   Kyser Wandel V  09/05/2015, 8:27 PM   Pager [phone] If no answer or after 5 PM call [phone]

## 2015-09-08 NOTE — Transfer of Care (Signed)
Immediate Anesthesia Transfer of Care Note  Patient: Kelli Hudson  Procedure(s) Performed: Procedure(s): ESOPHAGOGASTRODUODENOSCOPY (EGD) WITH PROPOFOL (Left)  Patient Location: 2  Anesthesia Type:MAC  Level of Consciousness: awake, alert , oriented and sedated  Airway & Oxygen Therapy: Patient Spontanous Breathing and Patient connected to nasal cannula oxygen  Post-op Assessment: Report given to RN, Post -op Vital signs reviewed and stable and Patient moving all extremities  Post vital signs: Reviewed and stable  Last Vitals:  Filed Vitals:   09/08/15 0726 09/08/15 1113  BP: 112/78 148/99  Pulse: 132 126  Temp: 36.8 C   Resp: 20 12    Last Pain:  Filed Vitals:   09/08/15 1114  PainSc: 10-Worst pain ever      Patients Stated Pain Goal: 0 (09/08/15 1113)  Complications: No apparent anesthesia complications

## 2015-09-08 NOTE — Progress Notes (Signed)
[facility] PROGRESS NOTE  [name] ZOX:[id number] DOB: 03-17-1990 DOA: 09/02/2015   PCP: No PCP Per Patient     Assessment/Plan: Active Problems:   Uncontrolled diabetes mellitus (HCC)   Vomiting   Abdominal pain   Diabetes mellitus due to underlying condition, uncontrolled, with hyperosmolarity without coma, without long-term current use of insulin (HCC)   Hyperglycemia     Kelli Hudson is a 25 y.o. female with a medical history significant for diabetes diagnosed in 2008.She hasn't had any metformin or insulin in at least two years due to cost. Patient began vomiting yesterday evening.Serum glucose 395, normal gap, bicarbonate 25 total bilirubin 1.9, on admission   Assessment and plan Uncontrolled diabetes mellitus (HCC).  Continues to have intractable nausea , vomiting, consuming <25% of her meals  hyperglycemia without DKA improved , hemoglobin A1c 14 Patient hydrated with IV fluids overnight and started on sliding scale insulin Continue insulin 70/30 with insulin , Accu-Chek stable Diabetes coordinator consultation,Currently does not have a PCP nor an Endocrinologist. Hasn't seen a doctor in years Patient has been untreated for her diabetes for 2 years      Abdominal pain, possible gastroparesis, intractable nausea vomiting Continues to have nausea and vomiting for several days, however no improvement with   PPI, Carafate  , IV Reglan. GI strongly recommends to discontinue all narcotics to help with gastric dysmotility Lipase and LFTs okay, CT abdomen pelvis shows under distended portions of the bowel. [facility] following as patient has not had any symptomatic improvement with the above treatment, they recommend ultrasound of the right upper quadrant which was negative ,  EGD  Shows gastritis and esophagitis  Continue present medications (PPI, sucralfate,   scheduled IV  antiemetics).  - If no improvement over the next 1-2 days, would   consider gastric emptying study on Monday.  - Await pathology results.  [facility] will follow. Also trial of Dulcolax suppository to relieve constipation , patient has already received Dulcolax suppository without response Will start patient on MiraLAX Patient also getting Zofran and Phenergan along with Reglan for nausea   Leukocytosis Likely reactive, chest x-ray negative, UA negative, resolved   Sinus tachycardia-continues to complain of intermittent chest pain  CTA of the chest No evidence of pulmonary embolism.   TSH nl , free T4 slightly high , cont  low-dose metoprolol   DVT prophylaxsis Lovenox  Code Status:  Full code  Family Communication: Discussed in detail with the patient, all imaging results, lab results explained to the patient   Disposition Plan:  Anticipate discharge when symptoms improve      Consultants:  [facility]  Procedures:  None  Antibiotics: Anti-infectives    None         HPI/Subjective: S/p EGD , same sx as yesterday, Abdominal pain persists. Nausea and vomiting persists.   Objective: Filed Vitals:   09/08/15 1113 09/08/15 1207 09/08/15 1210 09/08/15 1220  BP: 148/99  113/55 119/71  Pulse: 126  116 113  Temp:  98.4 F (36.9 C)    TempSrc:  Oral    Resp: 12  18 19   Height:      Weight:      SpO2: 99% 100% 100% 100%    Intake/Output Summary (Last 24 hours) at 09/08/15 1234 Last data filed at 09/08/15 1208  Gross per 24 hour  Intake 2748.33 ml  Output      0 ml  Net 2748.33 ml    Exam:  Examination:  General exam: Appears calm and comfortable  Respiratory system: Clear to auscultation. Respiratory effort normal. Cardiovascular system: S1 & S2 heard, RRR. No JVD, murmurs, rubs, gallops or clicks. No pedal edema. Gastrointestinal system:  Abdomen is nondistended, soft and Diffusely  tender . No organomegaly or masses felt. Normal bowel sounds heard. Central nervous system: Alert and oriented. No focal neurological deficits. Extremities: Symmetric 5 x 5 power. Skin: No rashes, lesions or ulcers Psychiatry: Judgement and insight appear normal. Mood & affect appropriate.     Data Reviewed: I have personally reviewed following labs and imaging studies  Micro Results No results found for this or any previous visit (from the past 240 hour(s)).  Radiology Reports Ct Angio Chest Pe W Or Wo Contrast  09/07/2015  CLINICAL DATA:  Chest pain and shortness of breath since Monday. Diabetes. EXAM: CT ANGIOGRAPHY CHEST WITH CONTRAST TECHNIQUE: Multidetector CT imaging of the chest was performed using the standard protocol during bolus administration of intravenous contrast. Multiplanar CT image reconstructions and MIPs were obtained to evaluate the vascular anatomy. CONTRAST:  80 cc of Omnipaque 370 COMPARISON:  Plain films of 09/03/2015. FINDINGS: Cardiovascular: The quality of this exam for evaluation of pulmonary embolism is good to excellent. No evidence of pulmonary embolism. Normal aortic caliber without dissection. Normal heart size, without pericardial effusion. Mediastinum/Nodes: No mediastinal or hilar adenopathy. Soft tissue density in the anterior mediastinum is likely residual thymus. Lungs/Pleura: No pleural fluid.  Clear lungs. Upper Abdomen: Normal imaged portions of the liver, spleen, stomach, pancreas, gallbladder, adrenal glands, kidneys. Musculoskeletal: No acute osseous abnormality. Review of the MIP images confirms the above findings. IMPRESSION: 1.  No evidence of pulmonary embolism. 2. No other explanation for chest pain. Electronically Signed   By: Jeronimo Greaves M.D.   On: 09/07/2015 14:16   US Abdomen Complete  09/07/2015  CLINICAL DATA:  Vomiting with upper abdominal pain. EXAM: ABDOMEN ULTRASOUND COMPLETE COMPARISON:   09/04/2015 CT scan. FINDINGS: Gallbladder: No gallstones or gallbladder wall thickening. No pericholecystic fluid. The sonographer reports no sonographic Murphy's sign. Common bile duct: Diameter: 2-3 mm Liver: No focal lesion identified. Within normal limits in parenchymal echogenicity. IVC: No abnormality visualized. Pancreas: Visualized portion unremarkable. Spleen: Size and appearance within normal limits. Right Kidney: Length: 11.1 cm. Echogenicity within normal limits. No mass or hydronephrosis visualized. Left Kidney: Length: 11.5 cm. Echogenicity within normal limits. No mass or hydronephrosis visualized. Abdominal aorta: No aneurysm visualized. Other findings: None. IMPRESSION: Normal ultrasound exam of the abdomen. Electronically Signed   By: Kennith Center M.D.   On: 09/07/2015 12:19   Ct Abdomen Pelvis W Contrast  09/04/2015  CLINICAL DATA:  25 year old diabetic female with abdominal pain and vomiting. Initial encounter. EXAM: CT ABDOMEN AND PELVIS WITH CONTRAST TECHNIQUE: Multidetector CT imaging of the abdomen and pelvis was performed using the standard protocol following bolus administration of intravenous contrast. CONTRAST:  1 ISOVUE-300 IOPAMIDOL (ISOVUE-300) INJECTION 61% COMPARISON:  None. FINDINGS: Lower chest:  Lung bases clear. Hepatobiliary: Liver within normal limits.  No calcified gallstones. Pancreas: No inflammation or mass. Spleen: Negative. Adrenals/Urinary Tract: No renal mass or evidence of renal collecting system obstruction. Adrenal glands unremarkable. Noncontrast filled views of the urinary bladder unremarkable. Stomach/Bowel: Under distended portions of bowel limit evaluation. The third portion duodenum is difficult to assess secondary to small caliber (which may reflect result of patient's habitus). Overall, no extra luminal bowel inflammatory process is noted. Specifically, no inflammation surrounds the appendix or terminal ileum. No free intraperitoneal air  or abnormal fluid  collection. Vascular/Lymphatic: No vascular obstruction or adenopathy. Reproductive: Corpus luteum cysts. Other: Negative. Musculoskeletal: No worrisome abnormality. IMPRESSION: Under distended portions of bowel limit evaluation. The third portion duodenum is difficult to assess secondary to small caliber (which may reflect result of patient's habitus). Overall, no extra luminal bowel inflammatory process is noted. Specifically, no inflammation surrounds the appendix or terminal ileum. Electronically Signed   By: Lacy Duverney M.D.   On: 09/04/2015 18:30   Dg Chest Port 1 View  09/03/2015  CLINICAL DATA:  Leukocytosis. EXAM: PORTABLE CHEST 1 VIEW COMPARISON:  No recent prior. FINDINGS: Mediastinum and hilar structures normal. Lungs are clear. Heart size normal. No pleural effusion or pneumothorax. IMPRESSION: No acute cardiopulmonary disease. Electronically Signed   By: Maisie Fus  Register   On: 09/03/2015 09:02     CBC  Recent Labs Lab 09/02/15 1226 09/03/15 0327 09/04/15 0505 09/06/15 0643 09/08/15 0449  WBC 7.2 13.9* 11.1* 8.4 8.9  HGB 13.4 11.2* 12.1 13.0 13.2  HCT 38.4 33.8* 35.3* 37.5 38.9  PLT 371 331 343 357 353  MCV 85.1 86.0 85.3 84.3 86.1  MCH 29.7 28.5 29.2 29.2 29.2  MCHC 34.9 33.1 34.3 34.7 33.9  RDW 12.2 12.4 12.4 12.5 12.5    Chemistries   Recent Labs Lab 09/02/15 1226  09/04/15 0500 09/04/15 0505 09/05/15 0543 09/06/15 0643 09/07/15 0455 09/08/15 0449  NA 133*  < >  --  139 136 134* 133* 137  K 3.5  < >  --  2.9* 3.7 4.3 4.9 4.0  CL 96*  < >  --  106 103 103 104 103  CO2 25  < >  --  25 25 23  18* 22  GLUCOSE 395*  < >  --  90 92 129* 171* 148*  BUN 15  < >  --  9 6 8 8 9   CREATININE 0.71  < >  --  0.53 0.65 0.65 0.74 0.74  CALCIUM 9.9  < >  --  8.9 9.0 9.3 9.4 9.5  MG  --   --  1.7  --   --   --   --   --   AST 17  --   --  15  --  14* 16 18  ALT 17  --   --  13*  --  13* 14 15  ALKPHOS 75  --   --  58  --  61 68 61  BILITOT 1.9*  --   --  1.1  --  1.6*  1.7* 1.6*  < > = values in this interval not displayed. ------------------------------------------------------------------------------------------------------------------ estimated creatinine clearance is 109.4 mL/min (by C-G formula based on Cr of 0.74). ------------------------------------------------------------------------------------------------------------------ No results for input(s): HGBA1C in the last 72 hours. ------------------------------------------------------------------------------------------------------------------ No results for input(s): CHOL, HDL, LDLCALC, TRIG, CHOLHDL, LDLDIRECT in the last 72 hours. ------------------------------------------------------------------------------------------------------------------  Recent Labs  09/07/15 0500  TSH 0.543   ------------------------------------------------------------------------------------------------------------------ No results for input(s): VITAMINB12, FOLATE, FERRITIN, TIBC, IRON, RETICCTPCT in the last 72 hours.  Coagulation profile No results for input(s): INR, PROTIME in the last 168 hours.  No results for input(s): DDIMER in the last 72 hours.  Cardiac Enzymes No results for input(s): CKMB, TROPONINI, MYOGLOBIN in the last 168 hours.  Invalid input(s): CK ------------------------------------------------------------------------------------------------------------------ Invalid input(s): POCBNP   CBG:  Recent Labs Lab 09/07/15 0757 09/07/15 1158 09/07/15 1653 09/07/15 2048 09/08/15 0724  GLUCAP 198* 189* 150* 128* 176*       Studies: Ct Angio  Chest Pe W Or Wo Contrast  09/07/2015  CLINICAL DATA:  Chest pain and shortness of breath since Monday. Diabetes. EXAM: CT ANGIOGRAPHY CHEST WITH CONTRAST TECHNIQUE: Multidetector CT imaging of the chest was performed using the standard protocol during bolus administration of intravenous contrast. Multiplanar CT image reconstructions and MIPs were  obtained to evaluate the vascular anatomy. CONTRAST:  80 cc of Omnipaque 370 COMPARISON:  Plain films of 09/03/2015. FINDINGS: Cardiovascular: The quality of this exam for evaluation of pulmonary embolism is good to excellent. No evidence of pulmonary embolism. Normal aortic caliber without dissection. Normal heart size, without pericardial effusion. Mediastinum/Nodes: No mediastinal or hilar adenopathy. Soft tissue density in the anterior mediastinum is likely residual thymus. Lungs/Pleura: No pleural fluid.  Clear lungs. Upper Abdomen: Normal imaged portions of the liver, spleen, stomach, pancreas, gallbladder, adrenal glands, kidneys. Musculoskeletal: No acute osseous abnormality. Review of the MIP images confirms the above findings. IMPRESSION: 1.  No evidence of pulmonary embolism. 2. No other explanation for chest pain. Electronically Signed   By: Jeronimo Greaves M.D.   On: 09/07/2015 14:16   US Abdomen Complete  09/07/2015  CLINICAL DATA:  Vomiting with upper abdominal pain. EXAM: ABDOMEN ULTRASOUND COMPLETE COMPARISON:  09/04/2015 CT scan. FINDINGS: Gallbladder: No gallstones or gallbladder wall thickening. No pericholecystic fluid. The sonographer reports no sonographic Murphy's sign. Common bile duct: Diameter: 2-3 mm Liver: No focal lesion identified. Within normal limits in parenchymal echogenicity. IVC: No abnormality visualized. Pancreas: Visualized portion unremarkable. Spleen: Size and appearance within normal limits. Right Kidney: Length: 11.1 cm. Echogenicity within normal limits. No mass or hydronephrosis visualized. Left Kidney: Length: 11.5 cm. Echogenicity within normal limits. No mass or hydronephrosis visualized. Abdominal aorta: No aneurysm visualized. Other findings: None. IMPRESSION: Normal ultrasound exam of the abdomen. Electronically Signed   By: Kennith Center M.D.   On: 09/07/2015 12:19      Lab Results  Component Value Date   HGBA1C 14.0* 09/02/2015   HGBA1C 13.7* 10/05/2009    Lab Results  Component Value Date   CREATININE 0.74 09/08/2015       Scheduled Meds: . [MAR Hold] enoxaparin (LOVENOX) injection  40 mg Subcutaneous Q24H  . [MAR Hold] feeding supplement (GLUCERNA SHAKE)  237 mL Oral TID BM  . [MAR Hold] insulin aspart  0-9 Units Subcutaneous TID WC  . [MAR Hold] insulin aspart protamine- aspart  18 Units Subcutaneous BID WC  . [MAR Hold] living well with diabetes book   Does not apply Once  . [MAR Hold] metoCLOPramide (REGLAN) injection  10 mg Intravenous Q6H  . [MAR Hold] metoprolol tartrate  12.5 mg Oral BID  . [MAR Hold] pantoprazole  40 mg Oral Daily  . [MAR Hold] polyethylene glycol  17 g Oral BID  . [MAR Hold] sorbitol  15 mL Oral Daily  . [MAR Hold] sucralfate  1 g Oral TID WC & HS   Continuous Infusions: . sodium chloride 100 mL/hr at 09/07/15 2105     LOS: 4 days    Time spent: >30 MINS    [facility]  [facility] Pager [phone]. If 7PM-7AM, please contact night-coverage at [email or web address], password [facility] 09/08/2015, 12:34 PM  LOS: 4 days

## 2015-09-08 NOTE — Anesthesia Preprocedure Evaluation (Addendum)
Anesthesia Evaluation  Patient identified by MRN, date of birth, ID band Patient awake    Reviewed: Allergy & Precautions, NPO status , Patient's Chart, lab work & pertinent test results  Airway Mallampati: I       Dental  (+) Teeth Intact   Pulmonary former smoker,    breath sounds clear to auscultation       Cardiovascular  Rhythm:Regular Rate:Normal     Neuro/Psych    GI/Hepatic Abd pain   Endo/Other  diabetes, Poorly ControlledPt noncompliant c meds  Renal/GU      Musculoskeletal   Abdominal (+) - obese,   Peds  Hematology   Anesthesia Other Findings   Reproductive/Obstetrics                            Anesthesia Physical Anesthesia Plan  ASA: II  Anesthesia Plan: MAC   Post-op Pain Management:    Induction:   Airway Management Planned: Natural Airway and Nasal Cannula  Additional Equipment:   Intra-op Plan:   Post-operative Plan:   Informed Consent: I have reviewed the patients History and Physical, chart, labs and discussed the procedure including the risks, benefits and alternatives for the proposed anesthesia with the patient or authorized representative who has indicated his/her understanding and acceptance.     Plan Discussed with: CRNA  Anesthesia Plan Comments:         Anesthesia Quick Evaluation

## 2015-09-08 NOTE — Interval H&P Note (Signed)
History and Physical Interval Note:  09/08/2015 11:23 AM  Kelli Hudson  has presented today for surgery, with the diagnosis of epigastric abdominal pain, nausea, vomiting  The various methods of treatment have been discussed with the patient and family. After consideration of risks, benefits and other options for treatment, the patient has consented to  Procedure(s): ESOPHAGOGASTRODUODENOSCOPY (EGD) WITH PROPOFOL (Left) as a surgical intervention .  The patient's history has been reviewed, patient examined, no change in status, stable for surgery.  I have reviewed the patient's chart and labs.  Questions were answered to the patient's satisfaction.     Kelli Hudson M  Assessment:  1.  Epigastric abdominal pain. 2.  Nausea and vomiting. 3.  Poorly controlled diabetes.  Plan:  1.  Endoscopy. 2.  Risks (bleeding, infection, bowel perforation that could require surgery, sedation-related changes in cardiopulmonary systems), benefits (identification and possible treatment of source of symptoms, exclusion of certain causes of symptoms), and alternatives (watchful waiting, radiographic imaging studies, empiric medical treatment) of upper endoscopy (EGD) were explained to patient/family in detail and patient wishes to proceed.

## 2015-09-08 NOTE — Progress Notes (Signed)
Hypoglycemic Event  CBG:67  Treatment: 25 cc Dextrose 50%  Symptoms: None  Follow-up CBG: Time: 1411 CBG Result: 112  Possible Reasons for Event: Was on procedure,Npo for procedure.  Comments/MD notified: Yes    [place], Kem Kays

## 2015-09-09 ENCOUNTER — Encounter (HOSPITAL_COMMUNITY)
Admission: EM | Disposition: A | Discharge status: Home / Self Care | Payer: Self-pay | Source: Home / Self Care | Attending: Internal Medicine

## 2015-09-09 DIAGNOSIS — D72829 Elevated white blood cell count, unspecified: Secondary | ICD-10-CM

## 2015-09-09 LAB — GLUCOSE, CAPILLARY
GLUCOSE-CAPILLARY: 134 mg/dL — AB (ref 65–99)
GLUCOSE-CAPILLARY: 130 mg/dL — AB (ref 65–99)
GLUCOSE-CAPILLARY: 141 mg/dL — AB (ref 65–99)

## 2015-09-09 SURGERY — EGD (ESOPHAGOGASTRODUODENOSCOPY)
Anesthesia: General

## 2015-09-09 MED ORDER — METOCLOPRAMIDE HCL 10 MG PO TABS: 10.0000 mg | ORAL_TABLET | Freq: Three times a day (TID) | ORAL | 2 refills | Status: DC

## 2015-09-09 MED ORDER — METOPROLOL TARTRATE 25 MG PO TABS: 12.5000 mg | ORAL_TABLET | Freq: Two times a day (BID) | ORAL | 0 refills | Status: DC

## 2015-09-09 MED ORDER — METOCLOPRAMIDE HCL 5 MG PO TABS
5.0000 mg | ORAL_TABLET | Freq: Three times a day (TID) | ORAL | Status: DC | PRN
  Administered 2015-09-09: 5 mg via ORAL
  Filled 2015-09-09: qty 1

## 2015-09-09 MED ORDER — METOCLOPRAMIDE HCL 5 MG PO TABS: 5.0000 mg | ORAL_TABLET | Freq: Three times a day (TID) | ORAL | 1 refills | Status: DC

## 2015-09-09 MED ORDER — INSULIN ASPART PROT & ASPART (70-30 MIX) 100 UNIT/ML ~~LOC~~ SUSP: 10.0000 [IU] | Freq: Two times a day (BID) | SUBCUTANEOUS | 11 refills | Status: DC

## 2015-09-09 MED ORDER — PANTOPRAZOLE SODIUM 40 MG PO TBEC: 40.0000 mg | DELAYED_RELEASE_TABLET | Freq: Every day | ORAL | 11 refills | Status: DC

## 2015-09-09 MED ORDER — POLYETHYLENE GLYCOL 3350 17 G PO PACK: 17.0000 g | PACK | Freq: Every day | ORAL | 0 refills | Status: DC | PRN

## 2015-09-09 MED ORDER — SUCRALFATE 1 GM/10ML PO SUSP: 1.0000 g | Freq: Three times a day (TID) | ORAL | 0 refills | Status: DC

## 2015-09-09 NOTE — Care Management (Signed)
CM spoke with patient assisted with establishing care. Appt arranged at the [facility] Monday 7/24 at 9a. Also placed [facility] with [facility]. Explained that patient will need to call to schedule appointment.

## 2015-09-09 NOTE — Discharge Summary (Signed)
Physician Discharge Summary  Kelli Hudson MRN: [id number] DOB/AGE: 22-Feb-1990 25 y.o.  PCP: No PCP Per Patient   Admit date: 09/02/2015 Discharge date: 09/09/2015  Discharge Diagnoses:    Active Problems:   Uncontrolled diabetes mellitus (HCC)   Vomiting   Abdominal pain   Diabetes mellitus due to underlying condition, uncontrolled, with hyperosmolarity without coma, without long-term current use of insulin (HCC)   Hyperglycemia   Leukocytosis    Follow-up recommendations Follow-up with PCP in 3-5 days , including all  additional recommended appointments as below Follow-up CBC, CMP in 3-5 days Patient will need to follow-up with Dr. Cristina Gong in the next 1-3 weeks Patient would benefit from outpatient endocrinology and PCP,     Current Discharge Medication List    START taking these medications   Details  insulin aspart protamine- aspart (NOVOLOG MIX 70/30) (70-30) 100 UNIT/ML injection Inject 0.1 mLs (10 Units total) into the skin 2 (two) times daily with a meal. Qty: 10 mL, Refills: 11    metoCLOPramide (REGLAN) 10 MG tablet Take 1 tablet (10 mg total) by mouth 3 (three) times daily before meals. Qty: 90 tablet, Refills: 2    metoprolol tartrate (LOPRESSOR) 25 MG tablet Take 0.5 tablets (12.5 mg total) by mouth 2 (two) times daily. Qty: 60 tablet, Refills: 0    pantoprazole (PROTONIX) 40 MG tablet Take 1 tablet (40 mg total) by mouth daily. Qty: 30 tablet, Refills: 11    polyethylene glycol (MIRALAX / GLYCOLAX) packet Take 17 g by mouth daily as needed for mild constipation. Qty: 14 each, Refills: 0    sucralfate (CARAFATE) 1 GM/10ML suspension Take 10 mLs (1 g total) by mouth 4 (four) times daily -  with meals and at bedtime. Qty: 420 mL, Refills: 0      STOP taking these medications     ibuprofen (ADVIL,MOTRIN) 600 MG tablet      insulin glargine (LANTUS) 100 UNIT/ML injection          Discharge Condition: Stable  Discharge Instructions Get  Medicines reviewed and adjusted: Please take all your medications with you for your next visit with your Primary MD  Please request your Primary MD to go over all hospital tests and procedure/radiological results at the follow up, please ask your Primary MD to get all Hospital records sent to his/her office.  If you experience worsening of your admission symptoms, develop shortness of breath, life threatening emergency, suicidal or homicidal thoughts you must seek medical attention immediately by calling 911 or calling your MD immediately if symptoms less severe.  You must read complete instructions/literature along with all the possible adverse reactions/side effects for all the Medicines you take and that have been prescribed to you. Take any new Medicines after you have completely understood and accpet all the possible adverse reactions/side effects.   Do not drive when taking Pain medications.   Do not take more than prescribed Pain, Sleep and Anxiety Medications  Special Instructions: If you have smoked or chewed Tobacco in the last 2 yrs please stop smoking, stop any regular Alcohol and or any Recreational drug use.  Wear Seat belts while driving.  Please note  You were cared for by a hospitalist during your hospital stay. Once you are discharged, your primary care physician will handle any further medical issues. Please note that NO REFILLS for any discharge medications will be authorized once you are discharged, as it is imperative that you return to your primary care physician (or establish a relationship  with a primary care physician if you do not have one) for your aftercare needs so that they can reassess your need for medications and monitor your lab values.  Discharge Instructions    Ambulatory referral to Nutrition and Diabetic Education    Complete by:  As directed   Diagnosed with DM 2008.  Former patient of Dr. Tobe Sos.  Off meds for 2 years now.  Restarted on Insulin this  admission.       No Known Allergies    Disposition: 01-Home or Self Care   Consults: * Gastroenterology  Significant Diagnostic Studies:  Ct Angio Chest Pe W Or Wo Contrast  Result Date: 09/07/2015 CLINICAL DATA:  Chest pain and shortness of breath since Monday. Diabetes. EXAM: CT ANGIOGRAPHY CHEST WITH CONTRAST TECHNIQUE: Multidetector CT imaging of the chest was performed using the standard protocol during bolus administration of intravenous contrast. Multiplanar CT image reconstructions and MIPs were obtained to evaluate the vascular anatomy. CONTRAST:  80 cc of Omnipaque 370 COMPARISON:  Plain films of 09/03/2015. FINDINGS: Cardiovascular: The quality of this exam for evaluation of pulmonary embolism is good to excellent. No evidence of pulmonary embolism. Normal aortic caliber without dissection. Normal heart size, without pericardial effusion. Mediastinum/Nodes: No mediastinal or hilar adenopathy. Soft tissue density in the anterior mediastinum is likely residual thymus. Lungs/Pleura: No pleural fluid.  Clear lungs. Upper Abdomen: Normal imaged portions of the liver, spleen, stomach, pancreas, gallbladder, adrenal glands, kidneys. Musculoskeletal: No acute osseous abnormality. Review of the MIP images confirms the above findings. IMPRESSION: 1.  No evidence of pulmonary embolism. 2. No other explanation for chest pain. Electronically Signed   By: Abigail Miyamoto M.D.   On: 09/07/2015 14:16   US Abdomen Complete  Result Date: 09/07/2015 CLINICAL DATA:  Vomiting with upper abdominal pain. EXAM: ABDOMEN ULTRASOUND COMPLETE COMPARISON:  09/04/2015 CT scan. FINDINGS: Gallbladder: No gallstones or gallbladder wall thickening. No pericholecystic fluid. The sonographer reports no sonographic Murphy's sign. Common bile duct: Diameter: 2-3 mm Liver: No focal lesion identified. Within normal limits in parenchymal echogenicity. IVC: No abnormality visualized. Pancreas: Visualized portion unremarkable.  Spleen: Size and appearance within normal limits. Right Kidney: Length: 11.1 cm. Echogenicity within normal limits. No mass or hydronephrosis visualized. Left Kidney: Length: 11.5 cm. Echogenicity within normal limits. No mass or hydronephrosis visualized. Abdominal aorta: No aneurysm visualized. Other findings: None. IMPRESSION: Normal ultrasound exam of the abdomen. Electronically Signed   By: Misty Stanley M.D.   On: 09/07/2015 12:19   Ct Abdomen Pelvis W Contrast  Result Date: 09/04/2015 CLINICAL DATA:  25 year old diabetic female with abdominal pain and vomiting. Initial encounter. EXAM: CT ABDOMEN AND PELVIS WITH CONTRAST TECHNIQUE: Multidetector CT imaging of the abdomen and pelvis was performed using the standard protocol following bolus administration of intravenous contrast. CONTRAST:  1 ISOVUE-300 IOPAMIDOL (ISOVUE-300) INJECTION 61% COMPARISON:  None. FINDINGS: Lower chest:  Lung bases clear. Hepatobiliary: Liver within normal limits.  No calcified gallstones. Pancreas: No inflammation or mass. Spleen: Negative. Adrenals/Urinary Tract: No renal mass or evidence of renal collecting system obstruction. Adrenal glands unremarkable. Noncontrast filled views of the urinary bladder unremarkable. Stomach/Bowel: Under distended portions of bowel limit evaluation. The third portion duodenum is difficult to assess secondary to small caliber (which may reflect result of patient's habitus). Overall, no extra luminal bowel inflammatory process is noted. Specifically, no inflammation surrounds the appendix or terminal ileum. No free intraperitoneal air or abnormal fluid collection. Vascular/Lymphatic: No vascular obstruction or adenopathy. Reproductive: Corpus luteum cysts. Other: Negative.  Musculoskeletal: No worrisome abnormality. IMPRESSION: Under distended portions of bowel limit evaluation. The third portion duodenum is difficult to assess secondary to small caliber (which may reflect result of patient's  habitus). Overall, no extra luminal bowel inflammatory process is noted. Specifically, no inflammation surrounds the appendix or terminal ileum. Electronically Signed   By: Genia Del M.D.   On: 09/04/2015 18:30   Dg Chest Port 1 View  Result Date: 09/03/2015 CLINICAL DATA:  Leukocytosis. EXAM: PORTABLE CHEST 1 VIEW COMPARISON:  No recent prior. FINDINGS: Mediastinum and hilar structures normal. Lungs are clear. Heart size normal. No pleural effusion or pneumothorax. IMPRESSION: No acute cardiopulmonary disease. Electronically Signed   By: Marcello Moores  Register   On: 09/03/2015 09:02       Filed Weights   09/06/15 2152 09/07/15 2059 09/08/15 2035  Weight: 73.2 kg (161 lb 6 oz) 73.5 kg (162 lb 0.6 oz) 73.6 kg (162 lb 4.1 oz)     Microbiology: No results found for this or any previous visit (from the past 240 hour(s)).     Blood Culture No results found for: SDES, SPECREQUEST, CULT, REPTSTATUS    Labs: Results for orders placed or performed during the hospital encounter of 09/02/15 (from the past 48 hour(s))  Glucose, capillary     Status: Abnormal   Collection Time: 09/07/15 11:58 AM  Result Value Ref Range   Glucose-Capillary 189 (H) 65 - 99 mg/dL  Lipase, blood     Status: None   Collection Time: 09/07/15 11:59 AM  Result Value Ref Range   Lipase 20 11 - 51 U/L  Glucose, capillary     Status: Abnormal   Collection Time: 09/07/15  4:53 PM  Result Value Ref Range   Glucose-Capillary 150 (H) 65 - 99 mg/dL  Glucose, capillary     Status: Abnormal   Collection Time: 09/07/15  8:48 PM  Result Value Ref Range   Glucose-Capillary 128 (H) 65 - 99 mg/dL  CBC     Status: None   Collection Time: 09/08/15  4:49 AM  Result Value Ref Range   WBC 8.9 4.0 - 10.5 K/uL   RBC 4.52 3.87 - 5.11 MIL/uL   Hemoglobin 13.2 12.0 - 15.0 g/dL   HCT 38.9 36.0 - 46.0 %   MCV 86.1 78.0 - 100.0 fL   MCH 29.2 26.0 - 34.0 pg   MCHC 33.9 30.0 - 36.0 g/dL   RDW 12.5 11.5 - 15.5 %   Platelets 353 150  - 400 K/uL  Comprehensive metabolic panel     Status: Abnormal   Collection Time: 09/08/15  4:49 AM  Result Value Ref Range   Sodium 137 135 - 145 mmol/L   Potassium 4.0 3.5 - 5.1 mmol/L   Chloride 103 101 - 111 mmol/L   CO2 22 22 - 32 mmol/L   Glucose, Bld 148 (H) 65 - 99 mg/dL   BUN 9 6 - 20 mg/dL   Creatinine, Ser 0.74 0.44 - 1.00 mg/dL   Calcium 9.5 8.9 - 10.3 mg/dL   Total Protein 6.6 6.5 - 8.1 g/dL   Albumin 3.3 (L) 3.5 - 5.0 g/dL   AST 18 15 - 41 U/L   ALT 15 14 - 54 U/L   Alkaline Phosphatase 61 38 - 126 U/L   Total Bilirubin 1.6 (H) 0.3 - 1.2 mg/dL   GFR calc non Af Amer >60 >60 mL/min   GFR calc Af Amer >60 >60 mL/min    Comment: (NOTE) The eGFR has been  calculated using the CKD EPI equation. This calculation has not been validated in all clinical situations. eGFR's persistently <60 mL/min signify possible Chronic Kidney Disease.    Anion gap 12 5 - 15  Glucose, capillary     Status: Abnormal   Collection Time: 09/08/15  7:24 AM  Result Value Ref Range   Glucose-Capillary 176 (H) 65 - 99 mg/dL  Glucose, capillary     Status: None   Collection Time: 09/08/15  1:11 PM  Result Value Ref Range   Glucose-Capillary 67 65 - 99 mg/dL  Glucose, capillary     Status: Abnormal   Collection Time: 09/08/15  2:06 PM  Result Value Ref Range   Glucose-Capillary 112 (H) 65 - 99 mg/dL  Glucose, capillary     Status: None   Collection Time: 09/08/15  4:36 PM  Result Value Ref Range   Glucose-Capillary 83 65 - 99 mg/dL  Glucose, capillary     Status: None   Collection Time: 09/08/15  8:35 PM  Result Value Ref Range   Glucose-Capillary 78 65 - 99 mg/dL  Glucose, capillary     Status: Abnormal   Collection Time: 09/09/15  8:01 AM  Result Value Ref Range   Glucose-Capillary 134 (H) 65 - 99 mg/dL     Lipid Panel     Component Value Date/Time   CHOL 225 (H) 10/05/2009 1055   TRIG 237.0 (H) 10/05/2009 1055   HDL 42.00 10/05/2009 1055   CHOLHDL 5 10/05/2009 1055   VLDL  47.4 (H) 10/05/2009 1055   LDLDIRECT 152.4 10/05/2009 1055     Lab Results  Component Value Date   HGBA1C 14.0 (H) 09/02/2015   HGBA1C 13.7 (H) 10/05/2009     Lab Results  Component Value Date   CREATININE 0.74 09/08/2015     HPI :  Kelli Hudson is a 25 y.o. female with a 9 year h/o diabetes (untreated for 2 years, Hgb A-1-C 14) admitted through the emergency room 3 days ago with a one-day history of nausea and vomiting, and with moderate metabolic disarray.Interestingly, the patient, who works as a Freight forwarder at a [employer], denies chronic nausea or food intolerance or anorexia or history of weight loss, so she does not have symptoms of chronic gastrointestinal dysmotility despite her 9 year history of diabetes, which appears to have been very poorly controlled.  The patient denies significant exposure to nonsteroidals (uses ibuprofen for a couple of days at the start of her menstrual cycle).Since the symptoms began several days ago, even small amounts of food intake are associated with vomiting, which may be saliva, bile, or the food she has just eaten. However, her vomiting has not been entirely postprandial. She states that this evening, she a small amount of solid food and kept it down.   HOSPITAL COURSE:    Uncontrolled diabetes mellitus ([place]).  hyperglycemia without DKA improved , hemoglobin A1c 14 Patient hydrated with IV fluids overnight and started on sliding scale insulin  Also started on insulin 70/30 with insulin , Accu-Chek stable Diabetes coordinator consultation,Currently does not have a PCP nor an Endocrinologist. Hasn't seen a doctor in years Patient has been untreated for her diabetes for 2 years       Abdominal pain, possible gastroparesis, intractable nausea vomiting Continues to have nausea and vomiting for several days, however no improvement with   PPI, Carafate  , IV Reglan. GI was consulted and they recommended to discontinue all  narcotics to help with gastric dysmotility Lipase  and LFTs okay, CT abdomen pelvis shows under distended portions of the bowel. [facility] following as patient has not had any symptomatic improvement with the above treatment, they recommend ultrasound of the right upper quadrant which was negative ,  EGD  Shows gastritis and esophagitis  Continue present medications (PPI, sucralfate,   scheduled IV antiemetics).  - If no improvement over the next 1-2 weeks, would   consider gastric emptying study   - Await pathology results.  [facility] will follow. Patient would need outpatient GI follow-up Also trial of Dulcolax suppository to relieve constipation ,Will start patient on MiraLAX Patient also getting Zofran and Phenergan along with Reglan for nausea Provided a prescription for Reglan due to suspected gastroparesis Extensive workup including an ultrasound of the abdomen, ultrasound of the chest, CT of the abdomen and pelvis unremarkable  Leukocytosis Likely reactive, chest x-ray negative, UA negative, resolved   Sinus tachycardia-continues to complain of intermittent chest pain  CTA of the chest No evidence of pulmonary embolism.   TSH nl , free T4 slightly high , cont  low-dose metoprolol   Discharge Exam:   Blood pressure 133/90, pulse (!) 113, temperature 99.3 F (37.4 C), temperature source Oral, resp. rate 18, height 5' 8"  (1.727 m), weight 73.6 kg (162 lb 4.1 oz), last menstrual period 08/20/2015, SpO2 98 %.  General exam: Appears calm and comfortable  Respiratory system: Clear to auscultation. Respiratory effort normal. Cardiovascular system: S1 & S2 heard, RRR. No JVD, murmurs, rubs, gallops or clicks. No pedal edema. Gastrointestinal system: Abdomen is nondistended, soft and Diffusely  tender . No organomegaly or masses felt. Normal  bowel sounds heard. Central nervous system: Alert and oriented. No focal neurological deficits. Extremities: Symmetric 5 x 5 power. Skin: No rashes, lesions or ulcers Psychiatry: Judgement and insight appear normal. Mood & affect appropriate.     Follow-up Information    Cleotis Nipper, MD. Call in 2 week(s).   Specialty:  Gastroenterology Why:  Hospital follow-up Contact information: [name] [place] [address] [phone]           Signed: Reyne Dumas 09/09/2015, 11:46 AM        Time spent >45 mins

## 2015-09-09 NOTE — Progress Notes (Signed)
Subjective: Patient's pain and nausea are improving. Patient feels hungry.  Objective: Vital signs in last 24 hours: Temp:  [98.2 F (36.8 C)-99.3 F (37.4 C)] 99.3 F (37.4 C) (07/23 0807) Pulse Rate:  [105-115] 113 (07/23 0807) Resp:  [18-20] 18 (07/23 0807) BP: (125-136)/(85-91) 133/90 (07/23 0807) SpO2:  [98 %-99 %] 98 % (07/23 0807) Weight:  [73.6 kg (162 lb 4.1 oz)] 73.6 kg (162 lb 4.1 oz) (07/22 2035) Weight change: 0.1 kg (3.5 oz) Last BM Date: 09/07/15  PE: GEN:  NAD ABD:  Mild epigastric tenderness (improved cf yesterday)  Lab Results: CBC    Component Value Date/Time   WBC 8.9 09/08/2015 0449   RBC 4.52 09/08/2015 0449   HGB 13.2 09/08/2015 0449   HCT 38.9 09/08/2015 0449   PLT 353 09/08/2015 0449   MCV 86.1 09/08/2015 0449   MCH 29.2 09/08/2015 0449   MCHC 33.9 09/08/2015 0449   RDW 12.5 09/08/2015 0449   LYMPHSABS 2.5 06/15/2012 1857   MONOABS 0.4 06/15/2012 1857   EOSABS 0.1 06/15/2012 1857   BASOSABS 0.0 06/15/2012 1857   CMP     Component Value Date/Time   NA 137 09/08/2015 0449   K 4.0 09/08/2015 0449   CL 103 09/08/2015 0449   CO2 22 09/08/2015 0449   GLUCOSE 148 (H) 09/08/2015 0449   BUN 9 09/08/2015 0449   CREATININE 0.74 09/08/2015 0449   CALCIUM 9.5 09/08/2015 0449   PROT 6.6 09/08/2015 0449   ALBUMIN 3.3 (L) 09/08/2015 0449   AST 18 09/08/2015 0449   ALT 15 09/08/2015 0449   ALKPHOS 61 09/08/2015 0449   BILITOT 1.6 (H) 09/08/2015 0449   GFRNONAA >60 09/08/2015 0449   GFRAA >60 09/08/2015 0449   Assessment:  1.  Epigastric pain.  Negative ultrasound, CT scan, labs. EGD yesterday showed mild esophagitis and mild gastritis only.  Patient has improved over the past 24 hours. 2.  Nausea and vomiting.  History of diabetes.  Has improved over the past 24 hours. 3.  Uncontrolled diabetes, reason for admission, glucose values improving.  Plan:  1.  Advance diet. 2.  Transition to oral antiemetics. 3.  If patient does well with oral  medications and advancing diet, would consider discharge home tomorrow. 4.  [facility] will follow.   Kelli Hudson 09/09/2015, 12:23 PM   Pager [phone] If no answer or after 5 PM call [phone]

## 2015-09-09 NOTE — Progress Notes (Signed)
Patient have no complaint on her abdomen since eating her lunch and dinner meals,thought her dinner p.o intake is just 30% out of her meal tray.No abdominal discomfort,no abdominal pain,no nausea and vomiting.M.D. made aware.

## 2015-09-10 ENCOUNTER — Ambulatory Visit: Payer: BLUE CROSS/BLUE SHIELD | Attending: Physician Assistant | Admitting: Physician Assistant

## 2015-09-10 VITALS — BP 93/63 | HR 82 | Temp 98.2°F | Resp 16 | Ht 68.0 in | Wt 161.0 lb

## 2015-09-10 DIAGNOSIS — E119 Type 2 diabetes mellitus without complications: Secondary | ICD-10-CM

## 2015-09-10 DIAGNOSIS — Z794 Long term (current) use of insulin: Secondary | ICD-10-CM | POA: Diagnosis not present

## 2015-09-10 LAB — GLUCOSE, POCT (MANUAL RESULT ENTRY): POC Glucose: 174 mg/dl — AB (ref 70–99)

## 2015-09-10 MED ORDER — GLUCOSE BLOOD VI STRP: ORAL_STRIP | 12 refills | Status: DC

## 2015-09-10 MED ORDER — TRUEPLUS LANCETS 28G MISC: 3 refills | Status: DC

## 2015-09-10 MED ORDER — TRUE METRIX METER W/DEVICE KIT: 1.0000 | PACK | Freq: Four times a day (QID) | 0 refills | Status: DC

## 2015-09-10 NOTE — Progress Notes (Signed)
Pt here for hospital F/U and diabetes. Pt denies pain today. CBG Is 174. Pt has eaten today.

## 2015-09-10 NOTE — Progress Notes (Signed)
Abrielle Finck  WIO:[id number]  LAG:[id number]  DOB - [birth year]  Chief Complaint  Patient presents with  . Hospitalization Follow-up       Subjective:   Kelli Hudson is a 25 y.o. female here today for establishment of care. She was diagnosed with diabetes in 2008. She was put on insulin at that time. Due to some social issues she's not taking any medications for the last 2 years. She has not been checking her blood sugar either. She was hospitalized July 16th - July 23rd after she presented with abdominal pain, nausea and vomiting. Her blood sugar was approximately 350. Her hemoglobin A1c was 14%. She had a difficult time to keep any foods down. Ultimately she was sent home on 70/30 mix 10 units twice daily. She's been compliant with her regimen but does not have a meter to check her sugars. She has no complaints today.  ROS: GEN: denies fever or chills, denies change in weight Skin: denies lesions or rashes HEENT: denies headache, earache, epistaxis, sore throat, or neck pain LUNGS: denies SHOB, dyspnea, PND, orthopnea CV: denies CP or palpitations ABD: denies abd pain, N or V-gone EXT: denies muscle spasms or swelling; no pain in lower ext, no weakness NEURO: denies numbness or tingling, denies sz, stroke or TIA  ALLERGIES: No Known Allergies  PAST MEDICAL HISTORY: Past Medical History:  Diagnosis Date  . Diabetes mellitus without complication ([place])     PAST SURGICAL HISTORY: Past Surgical History:  Procedure Laterality Date  . ESOPHAGOGASTRODUODENOSCOPY (EGD) WITH PROPOFOL Left 09/08/2015   Procedure: ESOPHAGOGASTRODUODENOSCOPY (EGD) WITH PROPOFOL;  Surgeon: Arta Silence, MD;  Location: [facility] ENDOSCOPY;  Service: Endoscopy;  Laterality: Left;    MEDICATIONS AT HOME: Prior to Admission medications   Medication Sig Start Date End Date Taking? Authorizing Provider  insulin aspart protamine- aspart (NOVOLOG MIX 70/30) (70-30) 100 UNIT/ML injection Inject  0.1 mLs (10 Units total) into the skin 2 (two) times daily with a meal. 09/09/15  Yes Reyne Dumas, MD  metoCLOPramide (REGLAN) 10 MG tablet Take 1 tablet (10 mg total) by mouth 3 (three) times daily before meals. 09/09/15  Yes Reyne Dumas, MD  metoCLOPramide (REGLAN) 5 MG tablet Take 1 tablet (5 mg total) by mouth 3 (three) times daily before meals. 09/09/15  Yes Reyne Dumas, MD  metoprolol tartrate (LOPRESSOR) 25 MG tablet Take 0.5 tablets (12.5 mg total) by mouth 2 (two) times daily. 09/09/15  Yes Reyne Dumas, MD  pantoprazole (PROTONIX) 40 MG tablet Take 1 tablet (40 mg total) by mouth daily. 09/09/15  Yes Reyne Dumas, MD  polyethylene glycol (MIRALAX / GLYCOLAX) packet Take 17 g by mouth daily as needed for mild constipation. 09/09/15  Yes Reyne Dumas, MD  Blood Glucose Monitoring Suppl (TRUE METRIX METER) w/Device KIT 1 Device by Does not apply route 4 (four) times daily. 09/10/15   Brayton Caves, PA-C  glucose blood (TRUE METRIX BLOOD GLUCOSE TEST) test strip Use as instructed 09/10/15   Brayton Caves, PA-C  sucralfate (CARAFATE) 1 GM/10ML suspension Take 10 mLs (1 g total) by mouth 4 (four) times daily -  with meals and at bedtime. Patient not taking: Reported on 09/10/2015 09/09/15   Reyne Dumas, MD  TRUEPLUS LANCETS 28G MISC assist with checking blood sugar TID and qhs 09/10/15   Brayton Caves, PA-C     Objective:   Vitals:   09/10/15 0943  BP: 93/63  Pulse: 82  Resp: 16  Temp: 98.2 F (36.8 C)  TempSrc: Oral  Weight: 161 lb (73 kg)  Height: 5' 8"  (1.727 m)    Exam General appearance : Awake, alert, not in any distress. Speech Clear. Not toxic looking HEENT: Atraumatic and Normocephalic, pupils equally reactive to light and accomodation Neck: supple, no JVD. No cervical lymphadenopathy.  Chest:Good air entry bilaterally, no added sounds  CVS: S1 S2 regular, no murmurs.  Abdomen: Bowel sounds present, Non tender and not distended with no gaurding, rigidity or  rebound. Extremities: B/L Lower Ext shows no edema, both legs are warm to touch Neurology: Awake alert, and oriented X 3, CN II-XII intact, Non focal Skin:No Rash  Data Review Lab Results  Component Value Date   HGBA1C 14.0 (H) 09/02/2015   HGBA1C 13.7 (H) 10/05/2009     Assessment & Plan  1. DM2 - uncontrolled  -Cont 70/30 mix 10 U BID  -Meter, lancets, strips; keep a log and bring to next appt  -DM education  -DC plan per discharge team was for CBC and CMP in 1 week    Return in about 1 week (around 09/17/2015).Needs CBC, CMP.   The patient was given clear instructions to go to ER or return to medical center if symptoms don't improve, worsen or new problems develop. The patient verbalized understanding. The patient was told to call to get lab results if they haven't heard anything in the next week.   This note has been created with Surveyor, quantity. Any transcriptional errors are unintentional.    Zettie Pho, PA-C [facility] [place], [place]   09/10/2015, 12:36 PM

## 2015-09-10 NOTE — Addendum Note (Signed)
Addended byScot Jun S on: 09/10/2015 01:10 PM   Modules accepted: Orders

## 2015-09-19 ENCOUNTER — Encounter: Payer: Self-pay | Admitting: Family Medicine

## 2015-09-19 ENCOUNTER — Ambulatory Visit: Payer: BLUE CROSS/BLUE SHIELD | Attending: Family Medicine | Admitting: Family Medicine

## 2015-09-19 VITALS — BP 100/64 | HR 93 | Temp 98.2°F | Ht 68.0 in | Wt 174.2 lb

## 2015-09-19 DIAGNOSIS — E0800 Diabetes mellitus due to underlying condition with hyperosmolarity without nonketotic hyperglycemic-hyperosmolar coma (NKHHC): Secondary | ICD-10-CM

## 2015-09-19 DIAGNOSIS — E1169 Type 2 diabetes mellitus with other specified complication: Secondary | ICD-10-CM | POA: Diagnosis not present

## 2015-09-19 DIAGNOSIS — Z794 Long term (current) use of insulin: Secondary | ICD-10-CM | POA: Insufficient documentation

## 2015-09-19 DIAGNOSIS — I10 Essential (primary) hypertension: Secondary | ICD-10-CM | POA: Diagnosis not present

## 2015-09-19 DIAGNOSIS — R609 Edema, unspecified: Secondary | ICD-10-CM | POA: Diagnosis not present

## 2015-09-19 DIAGNOSIS — R600 Localized edema: Secondary | ICD-10-CM | POA: Diagnosis not present

## 2015-09-19 DIAGNOSIS — Z79899 Other long term (current) drug therapy: Secondary | ICD-10-CM | POA: Diagnosis not present

## 2015-09-19 LAB — GLUCOSE, POCT (MANUAL RESULT ENTRY): POC Glucose: 100 mg/dl — AB (ref 70–99)

## 2015-09-19 MED ORDER — "INSULIN SYRINGE-NEEDLE U-100 31G X 15/64"" 0.5 ML MISC": 1.0000 | Freq: Two times a day (BID) | 5 refills | Status: DC

## 2015-09-19 MED ORDER — HYDROCHLOROTHIAZIDE 12.5 MG PO TABS: 12.5000 mg | ORAL_TABLET | Freq: Every day | ORAL | 3 refills | Status: DC

## 2015-09-19 NOTE — Progress Notes (Signed)
Feet swelling - 2 weeks

## 2015-09-19 NOTE — Progress Notes (Signed)
Subjective:  Patient ID: Kelli Hudson, female    DOB: [birth year]  Age: 25 y.o. MRN: [id number]  CC: Diabetes   HPI Kelli Hudson is a 25 year old female with a history of type 2 diabetes mellitus (A1c 14), hypertension who comes into the clinic complaining of pedal edema for the last 2 weeks. She thinks edema is related to her new medications but denies shortness of breath or chest pains. Of note she works at [employer] and is on her feet a lot. Reports blood sugars at home have improved with fastings in the 100-110 range and random sugars between 100 -180.  Outpatient Medications Prior to Visit  Medication Sig Dispense Refill  . Blood Glucose Monitoring Suppl (TRUE METRIX METER) w/Device KIT 1 Device by Does not apply route 4 (four) times daily. 1 kit 0  . glucose blood (TRUE METRIX BLOOD GLUCOSE TEST) test strip Use as instructed 100 each 12  . insulin aspart protamine- aspart (NOVOLOG MIX 70/30) (70-30) 100 UNIT/ML injection Inject 0.1 mLs (10 Units total) into the skin 2 (two) times daily with a meal. 10 mL 11  . metoCLOPramide (REGLAN) 10 MG tablet Take 1 tablet (10 mg total) by mouth 3 (three) times daily before meals. 90 tablet 2  . metoCLOPramide (REGLAN) 5 MG tablet Take 1 tablet (5 mg total) by mouth 3 (three) times daily before meals. 90 tablet 1  . pantoprazole (PROTONIX) 40 MG tablet Take 1 tablet (40 mg total) by mouth daily. 30 tablet 11  . sucralfate (CARAFATE) 1 GM/10ML suspension Take 10 mLs (1 g total) by mouth 4 (four) times daily -  with meals and at bedtime. 420 mL 0  . TRUEPLUS LANCETS 28G MISC assist with checking blood sugar TID and qhs 100 each 3  . metoprolol tartrate (LOPRESSOR) 25 MG tablet Take 0.5 tablets (12.5 mg total) by mouth 2 (two) times daily. 60 tablet 0  . polyethylene glycol (MIRALAX / GLYCOLAX) packet Take 17 g by mouth daily as needed for mild constipation. (Patient not taking: Reported on 09/19/2015) 14 each 0   No  facility-administered medications prior to visit.     ROS Review of Systems  Cardiovascular: Positive for leg swelling.    Objective:  BP 100/64 (BP Location: Right Arm, Patient Position: Sitting, Cuff Size: Large)   Pulse 93   Temp 98.2 F (36.8 C) (Oral)   Ht 5' 8"  (1.727 m)   Wt 174 lb 3.2 oz (79 kg)   LMP 08/20/2015   SpO2 99%   BMI 26.49 kg/m   BP/Weight 09/19/2015 09/10/2015 0/04/7046  Systolic BP 889 93 169  Diastolic BP 64 63 87  Wt. (Lbs) 174.2 161 -  BMI 26.49 24.48 -      Physical Exam  Constitutional: She is oriented to person, place, and time. She appears well-developed and well-nourished.  Cardiovascular: Normal rate, normal heart sounds and intact distal pulses.   No murmur heard. Pulmonary/Chest: Effort normal and breath sounds normal. She has no wheezes. She has no rales. She exhibits no tenderness.  Abdominal: Soft. Bowel sounds are normal. She exhibits no distension and no mass. There is no tenderness.  Musculoskeletal: Normal range of motion.  Neurological: She is alert and oriented to person, place, and time.     Assessment & Plan:   1. Diabetes mellitus due to underlying condition, uncontrolled, with hyperosmolarity without coma, without long-term current use of insulin (HCC) Uncontrolled with A1c of 14 but blood sugar log reveals improvement. Continue current regimen  and I'll reassess at next visit - Glucose (CBG) - Insulin Syringe-Needle U-100 (BD INSULIN SYRINGE ULTRAFINE) 31G X 15/64" 0.5 ML MISC; 1 each by Does not apply route 2 (two) times daily.  Dispense: 60 each; Refill: 5  2. Essential hypertension Controlled Switched from metoprolol tartrate, to Hydrochlorothiazide due to complains of pedal edema Low-sodium, DASH diet - hydrochlorothiazide (HYDRODIURIL) 12.5 MG tablet; Take 1 tablet (12.5 mg total) by mouth daily.  Dispense: 30 tablet; Refill: 3  3. Pedal edema Could be dependent. Advised to elevate feet, cut back on sodium  intake   Meds ordered this encounter  Medications  . hydrochlorothiazide (HYDRODIURIL) 12.5 MG tablet    Sig: Take 1 tablet (12.5 mg total) by mouth daily.    Dispense:  30 tablet    Refill:  3    Discontinue metoprolol  . Insulin Syringe-Needle U-100 (BD INSULIN SYRINGE ULTRAFINE) 31G X 15/64" 0.5 ML MISC    Sig: 1 each by Does not apply route 2 (two) times daily.    Dispense:  60 each    Refill:  5    Follow-up: Return in about 3 months (around 12/20/2015) for Follow-up on diabetes mellitus.   Arnoldo Morale MD

## 2015-10-01 ENCOUNTER — Ambulatory Visit: Payer: BLUE CROSS/BLUE SHIELD | Admitting: *Deleted

## 2016-01-02 ENCOUNTER — Inpatient Hospital Stay (HOSPITAL_COMMUNITY)
Admission: EM | Admit: 2016-01-02 | Discharge: 2016-01-05 | DRG: 639 | Disposition: A | Discharge status: Home / Self Care | Payer: BLUE CROSS/BLUE SHIELD | Attending: Student in an Organized Health Care Education/Training Program | Admitting: Student in an Organized Health Care Education/Training Program

## 2016-01-02 ENCOUNTER — Encounter (HOSPITAL_COMMUNITY): Payer: Self-pay | Admitting: Emergency Medicine

## 2016-01-02 ENCOUNTER — Other Ambulatory Visit: Payer: Self-pay

## 2016-01-02 ENCOUNTER — Emergency Department (HOSPITAL_COMMUNITY): Payer: BLUE CROSS/BLUE SHIELD

## 2016-01-02 DIAGNOSIS — R112 Nausea with vomiting, unspecified: Secondary | ICD-10-CM | POA: Diagnosis not present

## 2016-01-02 DIAGNOSIS — K5900 Constipation, unspecified: Secondary | ICD-10-CM | POA: Diagnosis present

## 2016-01-02 DIAGNOSIS — E1065 Type 1 diabetes mellitus with hyperglycemia: Secondary | ICD-10-CM | POA: Diagnosis not present

## 2016-01-02 DIAGNOSIS — R1110 Vomiting, unspecified: Secondary | ICD-10-CM | POA: Diagnosis present

## 2016-01-02 DIAGNOSIS — Z794 Long term (current) use of insulin: Secondary | ICD-10-CM | POA: Diagnosis not present

## 2016-01-02 DIAGNOSIS — Z818 Family history of other mental and behavioral disorders: Secondary | ICD-10-CM

## 2016-01-02 DIAGNOSIS — E1110 Type 2 diabetes mellitus with ketoacidosis without coma: Secondary | ICD-10-CM | POA: Diagnosis present

## 2016-01-02 DIAGNOSIS — E1043 Type 1 diabetes mellitus with diabetic autonomic (poly)neuropathy: Secondary | ICD-10-CM | POA: Diagnosis present

## 2016-01-02 DIAGNOSIS — R000 Tachycardia, unspecified: Secondary | ICD-10-CM | POA: Diagnosis not present

## 2016-01-02 DIAGNOSIS — E1010 Type 1 diabetes mellitus with ketoacidosis without coma: Principal | ICD-10-CM

## 2016-01-02 DIAGNOSIS — Z91128 Patient's intentional underdosing of medication regimen for other reason: Secondary | ICD-10-CM

## 2016-01-02 DIAGNOSIS — E860 Dehydration: Secondary | ICD-10-CM | POA: Diagnosis present

## 2016-01-02 DIAGNOSIS — R079 Chest pain, unspecified: Secondary | ICD-10-CM

## 2016-01-02 DIAGNOSIS — K3184 Gastroparesis: Secondary | ICD-10-CM | POA: Diagnosis present

## 2016-01-02 DIAGNOSIS — Z801 Family history of malignant neoplasm of trachea, bronchus and lung: Secondary | ICD-10-CM | POA: Diagnosis not present

## 2016-01-02 DIAGNOSIS — T383X6A Underdosing of insulin and oral hypoglycemic [antidiabetic] drugs, initial encounter: Secondary | ICD-10-CM | POA: Diagnosis present

## 2016-01-02 DIAGNOSIS — E0810 Diabetes mellitus due to underlying condition with ketoacidosis without coma: Secondary | ICD-10-CM

## 2016-01-02 DIAGNOSIS — K219 Gastro-esophageal reflux disease without esophagitis: Secondary | ICD-10-CM | POA: Diagnosis present

## 2016-01-02 DIAGNOSIS — Z79899 Other long term (current) drug therapy: Secondary | ICD-10-CM

## 2016-01-02 DIAGNOSIS — Z9114 Patient's other noncompliance with medication regimen: Secondary | ICD-10-CM | POA: Diagnosis not present

## 2016-01-02 DIAGNOSIS — Z833 Family history of diabetes mellitus: Secondary | ICD-10-CM | POA: Diagnosis not present

## 2016-01-02 DIAGNOSIS — E0590 Thyrotoxicosis, unspecified without thyrotoxic crisis or storm: Secondary | ICD-10-CM | POA: Diagnosis present

## 2016-01-02 DIAGNOSIS — I10 Essential (primary) hypertension: Secondary | ICD-10-CM | POA: Diagnosis present

## 2016-01-02 DIAGNOSIS — Z87891 Personal history of nicotine dependence: Secondary | ICD-10-CM

## 2016-01-02 LAB — BASIC METABOLIC PANEL
ANION GAP: 11 (ref 5–15)
Anion gap: 5 (ref 5–15)
BUN: 13 mg/dL (ref 6–20)
BUN: 14 mg/dL (ref 6–20)
CHLORIDE: 116 mmol/L — AB (ref 101–111)
CO2: 21 mmol/L — ABNORMAL LOW (ref 22–32)
CO2: 22 mmol/L (ref 22–32)
CREATININE: 0.65 mg/dL (ref 0.44–1.00)
Calcium: 9.2 mg/dL (ref 8.9–10.3)
Calcium: 8.8 mg/dL — ABNORMAL LOW (ref 8.9–10.3)
Chloride: 109 mmol/L (ref 101–111)
Creatinine, Ser: 0.77 mg/dL (ref 0.44–1.00)
GFR calc Af Amer: >60 mL/min (ref >60)
GFR calc non Af Amer: >60 mL/min (ref >60)
GLUCOSE: 255 mg/dL — AB (ref 65–99)
GLUCOSE: 130 mg/dL — AB (ref 65–99)
POTASSIUM: 3.6 mmol/L (ref 3.5–5.1)
POTASSIUM: 3.5 mmol/L (ref 3.5–5.1)
SODIUM: 143 mmol/L (ref 135–145)
Sodium: 141 mmol/L (ref 135–145)

## 2016-01-02 LAB — GLUCOSE, CAPILLARY
GLUCOSE-CAPILLARY: 249 mg/dL — AB (ref 65–99)
GLUCOSE-CAPILLARY: 159 mg/dL — AB (ref 65–99)
GLUCOSE-CAPILLARY: 178 mg/dL — AB (ref 65–99)
Glucose-Capillary: 246 mg/dL — ABNORMAL HIGH (ref 65–99)
Glucose-Capillary: 227 mg/dL — ABNORMAL HIGH (ref 65–99)
Glucose-Capillary: 168 mg/dL — ABNORMAL HIGH (ref 65–99)
Glucose-Capillary: 131 mg/dL — ABNORMAL HIGH (ref 65–99)
Glucose-Capillary: 111 mg/dL — ABNORMAL HIGH (ref 65–99)
Glucose-Capillary: 154 mg/dL — ABNORMAL HIGH (ref 65–99)

## 2016-01-02 LAB — URINALYSIS, ROUTINE W REFLEX MICROSCOPIC
Bilirubin Urine: NEGATIVE
Glucose, UA: >1000 mg/dL — AB
Hgb urine dipstick: NEGATIVE
KETONES UR: 40 mg/dL — AB
LEUKOCYTES UA: NEGATIVE
NITRITE: NEGATIVE
PROTEIN: 30 mg/dL — AB
Specific Gravity, Urine: 1.026 (ref 1.005–1.030)
pH: 7 (ref 5.0–8.0)

## 2016-01-02 LAB — I-STAT CHEM 8, ED
BUN: 16 mg/dL (ref 6–20)
CREATININE: 0.6 mg/dL (ref 0.44–1.00)
Calcium, Ion: 1.22 mmol/L (ref 1.15–1.40)
Chloride: 106 mmol/L (ref 101–111)
GLUCOSE: 318 mg/dL — AB (ref 65–99)
HEMATOCRIT: 33 % — AB (ref 36.0–46.0)
HEMOGLOBIN: 11.2 g/dL — AB (ref 12.0–15.0)
POTASSIUM: 3.7 mmol/L (ref 3.5–5.1)
Sodium: 142 mmol/L (ref 135–145)
TCO2: 19 mmol/L (ref 0–100)

## 2016-01-02 LAB — COMPREHENSIVE METABOLIC PANEL
ALBUMIN: 4.5 g/dL (ref 3.5–5.0)
ALT: 17 U/L (ref 14–54)
ANION GAP: 17 — AB (ref 5–15)
AST: 20 U/L (ref 15–41)
Alkaline Phosphatase: 62 U/L (ref 38–126)
BILIRUBIN TOTAL: 1.5 mg/dL — AB (ref 0.3–1.2)
BUN: 16 mg/dL (ref 6–20)
CALCIUM: 10.3 mg/dL (ref 8.9–10.3)
CO2: 19 mmol/L — ABNORMAL LOW (ref 22–32)
Chloride: 100 mmol/L — ABNORMAL LOW (ref 101–111)
Creatinine, Ser: 0.84 mg/dL (ref 0.44–1.00)
GLUCOSE: 348 mg/dL — AB (ref 65–99)
POTASSIUM: 3.8 mmol/L (ref 3.5–5.1)
Sodium: 136 mmol/L (ref 135–145)
TOTAL PROTEIN: 8.6 g/dL — AB (ref 6.5–8.1)

## 2016-01-02 LAB — CBC
HCT: 38.3 % (ref 36.0–46.0)
Hemoglobin: 13.6 g/dL (ref 12.0–15.0)
MCH: 29.3 pg (ref 26.0–34.0)
MCHC: 35.5 g/dL (ref 30.0–36.0)
MCV: 82.5 fL (ref 78.0–100.0)
PLATELETS: 333 10*3/uL (ref 150–400)
RBC: 4.64 MIL/uL (ref 3.87–5.11)
RDW: 12.1 % (ref 11.5–15.5)
WBC: 11.7 10*3/uL — ABNORMAL HIGH (ref 4.0–10.5)

## 2016-01-02 LAB — PHOSPHORUS: PHOSPHORUS: 3.5 mg/dL (ref 2.5–4.6)

## 2016-01-02 LAB — URINE MICROSCOPIC-ADD ON

## 2016-01-02 LAB — MRSA PCR SCREENING: MRSA BY PCR: NEGATIVE

## 2016-01-02 LAB — MAGNESIUM: MAGNESIUM: 1.6 mg/dL — AB (ref 1.7–2.4)

## 2016-01-02 LAB — HIV ANTIBODY (ROUTINE TESTING W REFLEX): HIV SCREEN 4TH GENERATION: NONREACTIVE

## 2016-01-02 LAB — I-STAT BETA HCG BLOOD, ED (MC, WL, AP ONLY)

## 2016-01-02 LAB — I-STAT TROPONIN, ED: TROPONIN I, POC: 0 ng/mL (ref 0.00–0.08)

## 2016-01-02 LAB — CBG MONITORING, ED: GLUCOSE-CAPILLARY: 321 mg/dL — AB (ref 65–99)

## 2016-01-02 LAB — LIPASE, BLOOD: Lipase: 17 U/L (ref 11–51)

## 2016-01-02 MED ORDER — PROMETHAZINE HCL 25 MG/ML IJ SOLN
25.0000 mg | Freq: Four times a day (QID) | INTRAMUSCULAR | Status: DC | PRN
  Administered 2016-01-02 – 2016-01-04 (×5): 25 mg via INTRAVENOUS
  Filled 2016-01-02 (×5): qty 1

## 2016-01-02 MED ORDER — METOCLOPRAMIDE HCL 5 MG/ML IJ SOLN
10.0000 mg | Freq: Once | INTRAMUSCULAR | Status: AC
  Administered 2016-01-02: 10 mg via INTRAVENOUS
  Filled 2016-01-02: qty 2

## 2016-01-02 MED ORDER — SODIUM CHLORIDE 0.9 % IV SOLN
INTRAVENOUS | Status: DC
  Administered 2016-01-02: 3.7 [IU]/h via INTRAVENOUS
  Administered 2016-01-02: 1.4 [IU]/h via INTRAVENOUS
  Administered 2016-01-02: 0.9 [IU]/h via INTRAVENOUS
  Administered 2016-01-02: 5 [IU]/h via INTRAVENOUS
  Administered 2016-01-02: 18:00:00 via INTRAVENOUS
  Administered 2016-01-02: 0.5 [IU]/h via INTRAVENOUS
  Administered 2016-01-02: 1.9 [IU]/h via INTRAVENOUS
  Filled 2016-01-02: qty 2.5

## 2016-01-02 MED ORDER — PANTOPRAZOLE SODIUM 40 MG PO TBEC
40.0000 mg | DELAYED_RELEASE_TABLET | Freq: Every day | ORAL | Status: DC
  Administered 2016-01-02 – 2016-01-05 (×4): 40 mg via ORAL
  Filled 2016-01-02 (×4): qty 1

## 2016-01-02 MED ORDER — DEXTROSE-NACL 5-0.45 % IV SOLN: INTRAVENOUS | Status: DC

## 2016-01-02 MED ORDER — SODIUM CHLORIDE 0.9 % IV SOLN
INTRAVENOUS | Status: DC
  Filled 2016-01-02 (×2): qty 2.5

## 2016-01-02 MED ORDER — METOCLOPRAMIDE HCL 5 MG/ML IJ SOLN
10.0000 mg | Freq: Four times a day (QID) | INTRAMUSCULAR | Status: DC
  Administered 2016-01-02: 10 mg via INTRAVENOUS
  Filled 2016-01-02: qty 2

## 2016-01-02 MED ORDER — DEXTROSE-NACL 5-0.45 % IV SOLN
INTRAVENOUS | Status: DC
  Administered 2016-01-02: 14:00:00 via INTRAVENOUS

## 2016-01-02 MED ORDER — SODIUM CHLORIDE 0.9 % IV BOLUS (SEPSIS)
1000.0000 mL | Freq: Once | INTRAVENOUS | Status: AC
  Administered 2016-01-02: 1000 mL via INTRAVENOUS

## 2016-01-02 MED ORDER — ENOXAPARIN SODIUM 40 MG/0.4ML ~~LOC~~ SOLN
40.0000 mg | SUBCUTANEOUS | Status: DC
  Administered 2016-01-02: 40 mg via SUBCUTANEOUS
  Filled 2016-01-02: qty 0.4

## 2016-01-02 MED ORDER — SODIUM CHLORIDE 0.9 % IV SOLN
INTRAVENOUS | Status: DC
  Administered 2016-01-02: 09:00:00 via INTRAVENOUS

## 2016-01-02 MED ORDER — INSULIN ASPART 100 UNIT/ML ~~LOC~~ SOLN
10.0000 [IU] | Freq: Once | SUBCUTANEOUS | Status: AC
  Administered 2016-01-02: 10 [IU] via INTRAVENOUS
  Filled 2016-01-02: qty 1

## 2016-01-02 MED ORDER — KETOROLAC TROMETHAMINE 30 MG/ML IJ SOLN
30.0000 mg | Freq: Four times a day (QID) | INTRAMUSCULAR | Status: DC | PRN
  Administered 2016-01-02: 30 mg via INTRAVENOUS
  Filled 2016-01-02: qty 1

## 2016-01-02 MED ORDER — DIPHENHYDRAMINE HCL 50 MG/ML IJ SOLN
25.0000 mg | Freq: Once | INTRAMUSCULAR | Status: AC
  Administered 2016-01-02: 25 mg via INTRAVENOUS
  Filled 2016-01-02: qty 1

## 2016-01-02 MED ORDER — ONDANSETRON 4 MG PO TBDP
ORAL_TABLET | ORAL | Status: AC
  Filled 2016-01-02: qty 1

## 2016-01-02 MED ORDER — PROMETHAZINE HCL 25 MG RE SUPP: 25.0000 mg | Freq: Four times a day (QID) | RECTAL | Status: DC | PRN

## 2016-01-02 MED ORDER — ACETAMINOPHEN 500 MG PO TABS: 1000.0000 mg | ORAL_TABLET | Freq: Four times a day (QID) | ORAL | Status: DC | PRN

## 2016-01-02 MED ORDER — POTASSIUM CHLORIDE CRYS ER 20 MEQ PO TBCR
30.0000 meq | EXTENDED_RELEASE_TABLET | Freq: Once | ORAL | Status: AC
  Administered 2016-01-02: 30 meq via ORAL
  Filled 2016-01-02: qty 1

## 2016-01-02 MED ORDER — PROMETHAZINE HCL 25 MG PO TABS
25.0000 mg | ORAL_TABLET | Freq: Four times a day (QID) | ORAL | Status: DC | PRN
  Administered 2016-01-03: 25 mg via ORAL
  Filled 2016-01-02 (×2): qty 1

## 2016-01-02 MED ORDER — METOCLOPRAMIDE HCL 10 MG PO TABS
10.0000 mg | ORAL_TABLET | Freq: Three times a day (TID) | ORAL | Status: DC
  Administered 2016-01-02 – 2016-01-05 (×11): 10 mg via ORAL
  Filled 2016-01-02 (×11): qty 1

## 2016-01-02 MED ORDER — INSULIN GLARGINE 100 UNIT/ML ~~LOC~~ SOLN
18.0000 [IU] | SUBCUTANEOUS | Status: DC
  Administered 2016-01-02: 18 [IU] via SUBCUTANEOUS
  Filled 2016-01-02 (×2): qty 0.18

## 2016-01-02 MED ORDER — GI COCKTAIL ~~LOC~~
30.0000 mL | Freq: Three times a day (TID) | ORAL | Status: DC | PRN
  Administered 2016-01-04: 30 mL via ORAL
  Filled 2016-01-02 (×2): qty 30

## 2016-01-02 MED ORDER — SODIUM CHLORIDE 0.9 % IV SOLN
INTRAVENOUS | Status: DC
  Administered 2016-01-02 – 2016-01-03 (×2): via INTRAVENOUS

## 2016-01-02 MED ORDER — ONDANSETRON 4 MG PO TBDP
4.0000 mg | ORAL_TABLET | Freq: Once | ORAL | Status: AC
  Administered 2016-01-02: 4 mg via ORAL

## 2016-01-02 MED ORDER — INSULIN ASPART 100 UNIT/ML ~~LOC~~ SOLN
0.0000 [IU] | Freq: Three times a day (TID) | SUBCUTANEOUS | Status: DC
Start: 2016-01-02 — End: 2016-01-04
  Administered 2016-01-03 (×3): 2 [IU] via SUBCUTANEOUS

## 2016-01-02 MED ORDER — INSULIN ASPART 100 UNIT/ML ~~LOC~~ SOLN
0.0000 [IU] | Freq: Every day | SUBCUTANEOUS | Status: DC
  Administered 2016-01-03: 2 [IU] via SUBCUTANEOUS

## 2016-01-02 MED ORDER — PROMETHAZINE HCL 25 MG/ML IJ SOLN
25.0000 mg | Freq: Once | INTRAMUSCULAR | Status: AC
  Administered 2016-01-02: 25 mg via INTRAVENOUS
  Filled 2016-01-02: qty 1

## 2016-01-02 NOTE — ED Provider Notes (Signed)
[facility]-EMERGENCY DEPT Provider Note   CSN: [id number] Arrival date & time: 01/02/16  0124   By signing my name below, I, Avnee Patel, attest that this documentation has been prepared under the direction and in the presence of Devian Bartolomei F Kirill Chatterjee, MD  Electronically Signed: Avnee Patel, ED Scribe. 01/02/16. 3:30 AM.   History   Chief Complaint Chief Complaint  Patient presents with  . Emesis  . Chest Pain   The history is provided by the patient. No language interpreter was used.   HPI Comments:  Kelli Hudson is a 25 y.o. female,with a hx of gastroparesis, who presents to the Emergency Department complaining of intermittent episodes of emesis x 2 days. Pt notes associated "10/10, burning" chest pain due to vomiting. She notes her pain is worse when vomiting. No alleviating factors noted. Pt denies abdominal pain, diarrhea and any other symptoms at this time.   Past Medical History:  Diagnosis Date  . Diabetes mellitus without complication (HCC)     Patient Active Problem List   Diagnosis Date Noted  . DKA (diabetic ketoacidoses) (HCC) 01/02/2016  . Leukocytosis   . Hyperglycemia   . Abdominal pain   . Diabetes mellitus due to underlying condition, uncontrolled, with hyperosmolarity without coma, without long-term current use of insulin (HCC)   . Vomiting 09/02/2015  . THYROMEGALY 10/05/2009  . Uncontrolled diabetes mellitus (HCC) 10/05/2009  . Essential hypertension 10/05/2009    Past Surgical History:  Procedure Laterality Date  . ESOPHAGOGASTRODUODENOSCOPY (EGD) WITH PROPOFOL Left 09/08/2015   Procedure: ESOPHAGOGASTRODUODENOSCOPY (EGD) WITH PROPOFOL;  Surgeon: William Outlaw, MD;  Location: [facility] ENDOSCOPY;  Service: Endoscopy;  Laterality: Left;    OB History    No data available       Home Medications    Prior to Admission medications   Medication Sig Start Date End Date Taking? Authorizing Provider  Blood Glucose Monitoring Suppl (TRUE METRIX METER)  w/Device KIT 1 Device by Does not apply route 4 (four) times daily. 09/10/15  Yes Tiffany S Noel, PA-C  glucose blood (TRUE METRIX BLOOD GLUCOSE TEST) test strip Use as instructed 09/10/15  Yes Tiffany S Noel, PA-C  hydrochlorothiazide (HYDRODIURIL) 12.5 MG tablet Take 1 tablet (12.5 mg total) by mouth daily. 09/19/15  Yes Enobong Amao, MD  insulin aspart protamine- aspart (NOVOLOG MIX 70/30) (70-30) 100 UNIT/ML injection Inject 0.1 mLs (10 Units total) into the skin 2 (two) times daily with a meal. Patient taking differently: Inject 10 Units into the skin 3 (three) times daily with meals.  09/09/15  Yes Nayana Abrol, MD  Insulin Syringe-Needle U-100 (BD INSULIN SYRINGE ULTRAFINE) 31G X 15/64" 0.5 ML MISC 1 each by Does not apply route 2 (two) times daily. 09/19/15  Yes Enobong Amao, MD  pantoprazole (PROTONIX) 40 MG tablet Take 1 tablet (40 mg total) by mouth daily. 09/09/15  Yes Nayana Abrol, MD  TRUEPLUS LANCETS 28G MISC assist with checking blood sugar TID and qhs 09/10/15  Yes Tiffany S Noel, PA-C    Family History Family History  Problem Relation Age of Onset  . Lung cancer Mother   . Bipolar disorder Father     Social History Social History  Substance Use Topics  . Smoking status: Former Smoker  . Smokeless tobacco: Never Used  . Alcohol use 0.6 oz/week    1 Shots of liquor per week     Comment: occasional      Allergies   Patient has no known allergies.   Review of Systems Review of   Systems  Constitutional: Negative for fever.  Respiratory: Negative for shortness of breath.   Cardiovascular: Positive for chest pain.  Gastrointestinal: Positive for vomiting. Negative for abdominal pain and diarrhea.  All other systems reviewed and are negative.    Physical Exam Updated Vital Signs BP 125/70   Pulse (!) 135   Temp 98.4 F (36.9 C) (Oral)   Resp 15   Ht 5' 7" (1.702 m)   Wt 170 lb (77.1 kg)   LMP 11/18/2015 (Within Weeks)   SpO2 100%   BMI 26.63 kg/m   Physical  Exam  Constitutional: She is oriented to person, place, and time. No distress.  Ill-appearing but in no acute distress  HENT:  Head: Normocephalic and atraumatic.  Mucous membranes dry  Cardiovascular: Regular rhythm and normal heart sounds.   Tachycardia  Pulmonary/Chest: Effort normal and breath sounds normal. No respiratory distress. She has no wheezes.  Abdominal: Soft. Bowel sounds are normal. There is tenderness. There is no guarding.  Epigastric tenderness to palpation without rebound or guarding  Neurological: She is alert and oriented to person, place, and time.  Skin: Skin is warm and dry.  Psychiatric: She has a normal mood and affect.  Nursing note and vitals reviewed.    ED Treatments / Results  DIAGNOSTIC STUDIES:  Oxygen Saturation is 99% on RA, normal by my interpretation.    COORDINATION OF CARE:  3:27 AM Discussed treatment plan with pt at bedside and pt agreed to plan.  Labs (all labs ordered are listed, but only abnormal results are displayed) Labs Reviewed  CBC - Abnormal; Notable for the following:       Result Value   WBC 11.7 (*)    All other components within normal limits  COMPREHENSIVE METABOLIC PANEL - Abnormal; Notable for the following:    Chloride 100 (*)    CO2 19 (*)    Glucose, Bld 348 (*)    Total Protein 8.6 (*)    Total Bilirubin 1.5 (*)    Anion gap 17 (*)    All other components within normal limits  URINALYSIS, ROUTINE W REFLEX MICROSCOPIC (NOT AT [facility]) - Abnormal; Notable for the following:    Glucose, UA >1000 (*)    Ketones, ur 40 (*)    Protein, ur 30 (*)    All other components within normal limits  URINE MICROSCOPIC-ADD ON - Abnormal; Notable for the following:    Squamous Epithelial / LPF 6-30 (*)    Bacteria, UA MANY (*)    All other components within normal limits  CBG MONITORING, ED - Abnormal; Notable for the following:    Glucose-Capillary 321 (*)    All other components within normal limits  I-STAT CHEM 8, ED -  Abnormal; Notable for the following:    Glucose, Bld 318 (*)    Hemoglobin 11.2 (*)    HCT 33.0 (*)    All other components within normal limits  LIPASE, BLOOD  I-STAT TROPOININ, ED  I-STAT BETA HCG BLOOD, ED ([facility], [facility], [facility] ONLY)    EKG  EKG Interpretation  Date/Time:  Wednesday January 02 2016 01:29:19 EST Ventricular Rate:  134 PR Interval:    QRS Duration: 86 QT Interval:  382 QTC Calculation: 570 R Axis:   76 Text Interpretation:   Critical Test Result: Long QTc Sinus tachycardia with short PR Otherwise normal ECG Confirmed by   MD,  ([id number]) on 01/02/2016 2:58:07 AM       Radiology Dg Chest 2 View    Result Date: 01/02/2016 CLINICAL DATA:  Acute onset of mid to left-sided chest pain and nausea and vomiting. Initial encounter. EXAM: CHEST  2 VIEW COMPARISON:  Chest radiograph performed 09/03/2015, and CTA of the chest performed 09/07/2015 FINDINGS: The lungs are well-aerated and clear. There is no evidence of focal opacification, pleural effusion or pneumothorax. The heart is normal in size; the mediastinal contour is within normal limits. No acute osseous abnormalities are seen. IMPRESSION: No acute cardiopulmonary process seen. Electronically Signed   By: Jeffery  Chang M.D.   On: 01/02/2016 02:11    Procedures Procedures (including critical care time)  CRITICAL CARE Performed by: ,  F   Total critical care time: 25 minutes  Critical care time was exclusive of separately billable procedures and treating other patients.  Critical care was necessary to treat or prevent imminent or life-threatening deterioration.  Critical care was time spent personally by me on the following activities: development of treatment plan with patient and/or surrogate as well as nursing, discussions with consultants, evaluation of patient's response to treatment, examination of patient, obtaining history from patient or surrogate, ordering and performing treatments  and interventions, ordering and review of laboratory studies, ordering and review of radiographic studies, pulse oximetry and re-evaluation of patient's condition.   Medications Ordered in ED Medications  dextrose 5 %-0.45 % sodium chloride infusion (not administered)  insulin regular (NOVOLIN R,HUMULIN R) 250 Units in sodium chloride 0.9 % 250 mL (1 Units/mL) infusion (not administered)  ondansetron (ZOFRAN-ODT) disintegrating tablet 4 mg (4 mg Oral Given 01/02/16 0137)  sodium chloride 0.9 % bolus 1,000 mL (0 mLs Intravenous Stopped 01/02/16 0500)  metoCLOPramide (REGLAN) injection 10 mg (10 mg Intravenous Given 01/02/16 0313)  diphenhydrAMINE (BENADRYL) injection 25 mg (25 mg Intravenous Given 01/02/16 0316)  sodium chloride 0.9 % bolus 1,000 mL (0 mLs Intravenous Stopped 01/02/16 0615)  sodium chloride 0.9 % bolus 1,000 mL (0 mLs Intravenous Stopped 01/02/16 0800)  promethazine (PHENERGAN) injection 25 mg (25 mg Intravenous Given 01/02/16 0705)  insulin aspart (novoLOG) injection 10 Units (10 Units Intravenous Given 01/02/16 0714)     Initial Impression / Assessment and Plan / ED Course  I have reviewed the triage vital signs and the nursing notes.  Pertinent labs & imaging results that were available during my care of the patient were reviewed by me and considered in my medical decision making (see chart for details).  Clinical Course     Patient presents with nausea and vomiting. Also reports chest pain. Worse with vomiting. Described as burning. Notably tachycardic and appears dry. However, no other significant physical exam findings. Patient given several liters of fluid. Initial chem 8 with glucose of 381 and anion gap is 17. She does have ketones in urine. Question whether this is related to dehydration versus true diabetic ketoacidosis. Will hydrate and repeat chem 8. Patient was given Reglan. She reports that she feels better after Reglan but is unable to tolerate fluids. She  has received a total of 2 L of fluid. Repeat chem 8 with persistent hyperglycemia and unchanged anion gap. Glucose stabilizer initiated and further fluids given. Will admit for inability to tolerate fluids and DKA.  Discussed with teaching service. Patient admitted to stepdown unit given that she is on glucose stabilizer.  Final Clinical Impressions(s) / ED Diagnoses   Final diagnoses:  Gastroparesis  Diabetic ketoacidosis without coma associated with type 2 diabetes mellitus (HCC)    New Prescriptions New Prescriptions   No medications on file     I personally performed the services described in this documentation, which was scribed in my presence. The recorded information has been reviewed and is accurate.     Merryl Hacker, MD 01/02/16 [phone]

## 2016-01-02 NOTE — ED Triage Notes (Signed)
Pt reports vomiting since Monday, states hx of gastroporesis and ate corn on Monday. States cp started after vomiting so much.

## 2016-01-02 NOTE — ED Notes (Signed)
Attempted report 

## 2016-01-02 NOTE — ED Notes (Signed)
Walked pt to the bathroom, on the way back to room she said she got really tired and she did vomit in the bathroom.

## 2016-01-02 NOTE — ED Notes (Signed)
Pt c/o continued nausea.  Pt actively vomiting watery emesis at this time.  Pt c/o feeling SOB.  O2 saturations 100% on RA.  O2 movement noted in lung sounds.  Will check CBG and move pt to next available room.

## 2016-01-02 NOTE — H&P (Signed)
Date: 01/02/2016               Patient Name:  Kelli Hudson MRN: [id number]  DOB: 1990/07/04 Age / Sex: 24 y.o., female   PCP: Arnoldo Morale, MD         Medical Service: Internal Medicine Teaching Service         Attending Physician: Dr. Axel Filler, MD    First Contact: Dr. Reesa Chew Pager: [phone]  Second Contact: Dr. Benjamine Mola Pager: [phone]       After Hours (After 5p/  First Contact Pager: [phone]  weekends / holidays): Second Contact Pager: [phone]   Chief Complaint: vomiting  History of Present Illness: 25 year old with history of diabetes mellitus type 2 presenting with nausea, vomiting and abdominal pain. Started on Monday with some abdominal discomfort. Tuesday, her symptoms got worse. She developed nausea and vomiting. Her abdominal pain is diffuse. The pain is constant and does not radiate. Emesis makes it worse. Lying down sometimes helps. Did not try any medications at home. She has associated mid chest pain. It is sharp. It does not radiate. It is also constant. She reports dyspnea. Denies fevers or chills. No sick contacts. Similar episode in August - she was diagnosed with gastroparesis at that time.  She was diagnosed with diabetes in 2008. She takes Novolog 70/30 10u twice a day with meals. She often misses doses and ends up taking it once a day.   She sometimes has diarrhea. She had a BM today that was loose. No hematochezia or melena. No dysuria, no hematuria   Meds:  Current Meds  Medication Sig  . Blood Glucose Monitoring Suppl (TRUE METRIX METER) w/Device KIT 1 Device by Does not apply route 4 (four) times daily.  [name] glucose blood (TRUE METRIX BLOOD GLUCOSE TEST) test strip Use as instructed  . hydrochlorothiazide (HYDRODIURIL) 12.5 MG tablet Take 1 tablet (12.5 mg total) by mouth daily.  . insulin aspart protamine- aspart (NOVOLOG MIX 70/30) (70-30) 100 UNIT/ML injection Inject 0.1 mLs (10 Units total) into the skin 2 (two) times daily with a  meal. (Patient taking differently: Inject 10 Units into the skin 3 (three) times daily with meals. )  . Insulin Syringe-Needle U-100 (BD INSULIN SYRINGE ULTRAFINE) 31G X 15/64" 0.5 ML MISC 1 each by Does not apply route 2 (two) times daily.  . pantoprazole (PROTONIX) 40 MG tablet Take 1 tablet (40 mg total) by mouth daily.  . TRUEPLUS LANCETS 28G MISC assist with checking blood sugar TID and qhs     Allergies: Allergies as of 01/02/2016  . (No Known Allergies)   Past Medical History:  Diagnosis Date  . Diabetes mellitus without complication ([place])     Family History:  Aunt has diabetes  Social History:  No tobacco  Occasional alcohol Denies illicits  Review of Systems: A complete ROS was negative except as per HPI.   Physical Exam: Blood pressure 149/94, pulse (!) 132, temperature 98.4 F (36.9 C), temperature source Oral, resp. rate 16, height _0  (1.702 m), weight 170 lb (77.1 kg), last menstrual period 11/18/2015, SpO2 99 %. General Apperance: NAD Head: Normocephalic, atraumatic Eyes: PERRL, EOMI, anicteric sclera Ears: Normal external ear canal Nose: Nares normal, septum midline, mucosa normal Throat: Lips, mucosa and tongue normal  Neck: Supple, trachea midline Back: No tenderness or bony abnormality  Lungs: Clear to auscultation bilaterally. No wheezes, rhonchi or rales. Breathing comfortably Chest Wall: Nontender, no deformity Heart: Regular rate and rhythm, no murmur/rub/gallop  Abdomen: Soft, mildly tender to palpation throughout, nondistended, no rebound/guarding Extremities: Normal, atraumatic, warm and well perfused, no edema Pulses: 2+ throughout Skin: No rashes or lesions Neurologic: Alert and oriented x 3. CNII-XII intact. Normal strength and sensation  EKG: Sinus tachycardia, no previous EKG for comparison  CXR: No acute cardiopulmonary process  Assessment & Plan by Problem: 25 year old with history of diabetes mellitus type 2 presenting with  nausea, vomiting and abdominal pain.  DKA: Poorly controlled diabetes. Previous Hgb A1c was 14 in July. She is noncompliant with her medications for her diabetes. She is supposed to be on Novolog 70/30 10 units BID. BMP on presentation with bicarb of 19 and anion gap of 17. Glucose 348. WBC 11.7 and afebrile. UA negative for nitrite and leukocytes but positive for ketones. CXR unremarkable.  -BMP q 2-4 hours -Insulin gtt -NPO -Consider switching her to Lantus/Levemir as this would aid in compliance -Check hemoglobin A1c -NS @ 146m/hr and switch to D5 1/2 NS when CBG < 250  Acute Exacerbation of Diabetic Gastroparesis: Similar symptoms with previous episode of gastroparesis.  -Reglan 164mQID for 2 to 8 weeks. -Phenergan 2549m6hr prn N/V  Chest pain: Troponin POC and EKG unremarkable. CXR without acute cardiopulmonary process. Likely related to above problems. -Toradol 44m60mhr prn pain -GI cocktail prn  Hypertension: Home HCTZ held. Might consider switching her to lisinopril or losartan given her diabetes.  GERD: Continue home Protonix 40mg21mly  FEN: NPO VTE ppx: Lovenox Code status: FULL  Dispo: Admit patient to Inpatient with expected length of stay greater than 2 midnights.  Signed: JenniMilagros Loll11/15/2017, 7:31 AM  Pager: [phone]

## 2016-01-03 LAB — CBC
HCT: 33.2 % — ABNORMAL LOW (ref 36.0–46.0)
Hemoglobin: 11.4 g/dL — ABNORMAL LOW (ref 12.0–15.0)
MCH: 28.4 pg (ref 26.0–34.0)
MCHC: 34.3 g/dL (ref 30.0–36.0)
MCV: 82.8 fL (ref 78.0–100.0)
Platelets: 312 10*3/uL (ref 150–400)
RBC: 4.01 MIL/uL (ref 3.87–5.11)
RDW: 12.2 % (ref 11.5–15.5)
WBC: 15.9 10*3/uL — ABNORMAL HIGH (ref 4.0–10.5)

## 2016-01-03 LAB — BASIC METABOLIC PANEL
Anion gap: 9 (ref 5–15)
Anion gap: 9 (ref 5–15)
BUN: 11 mg/dL (ref 6–20)
BUN: 8 mg/dL (ref 6–20)
CALCIUM: 9.2 mg/dL (ref 8.9–10.3)
CHLORIDE: 105 mmol/L (ref 101–111)
CO2: 22 mmol/L (ref 22–32)
CO2: 23 mmol/L (ref 22–32)
CREATININE: 0.65 mg/dL (ref 0.44–1.00)
Calcium: 9.2 mg/dL (ref 8.9–10.3)
Chloride: 108 mmol/L (ref 101–111)
Creatinine, Ser: 0.7 mg/dL (ref 0.44–1.00)
GFR calc Af Amer: >60 mL/min (ref >60)
GFR calc Af Amer: >60 mL/min (ref >60)
GFR calc non Af Amer: >60 mL/min (ref >60)
GLUCOSE: 228 mg/dL — AB (ref 65–99)
GLUCOSE: 168 mg/dL — AB (ref 65–99)
Potassium: 3.2 mmol/L — ABNORMAL LOW (ref 3.5–5.1)
Potassium: 3.2 mmol/L — ABNORMAL LOW (ref 3.5–5.1)
SODIUM: 137 mmol/L (ref 135–145)
Sodium: 139 mmol/L (ref 135–145)

## 2016-01-03 LAB — GLUCOSE, CAPILLARY
GLUCOSE-CAPILLARY: 188 mg/dL — AB (ref 65–99)
GLUCOSE-CAPILLARY: 161 mg/dL — AB (ref 65–99)
Glucose-Capillary: 174 mg/dL — ABNORMAL HIGH (ref 65–99)
Glucose-Capillary: 202 mg/dL — ABNORMAL HIGH (ref 65–99)

## 2016-01-03 LAB — HEMOGLOBIN A1C
Hgb A1c MFr Bld: 9.7 % — ABNORMAL HIGH (ref 4.8–5.6)
Mean Plasma Glucose: 232 mg/dL

## 2016-01-03 MED ORDER — KCL IN DEXTROSE-NACL 20-5-0.45 MEQ/L-%-% IV SOLN
INTRAVENOUS | Status: DC
  Administered 2016-01-03 – 2016-01-05 (×3): via INTRAVENOUS
  Filled 2016-01-03 (×5): qty 1000
  Filled 2016-01-03: qty 2000
  Filled 2016-01-03: qty 1000

## 2016-01-03 MED ORDER — INSULIN GLARGINE 100 UNIT/ML ~~LOC~~ SOLN
18.0000 [IU] | Freq: Every day | SUBCUTANEOUS | Status: DC
  Administered 2016-01-03 – 2016-01-05 (×3): 18 [IU] via SUBCUTANEOUS
  Filled 2016-01-03 (×3): qty 0.18

## 2016-01-03 MED ORDER — INSULIN GLARGINE 100 UNIT/ML ~~LOC~~ SOLN
30.0000 [IU] | SUBCUTANEOUS | Status: DC
Start: 2016-01-03 — End: 2016-01-03
  Filled 2016-01-03: qty 0.3

## 2016-01-03 NOTE — Progress Notes (Signed)
Subjective: Currently, the patient is feeling mildly improved. Still with N/V. Unable to tolerate PO. No CP or abd pain.  Interval Events: AG closed x2 and insuling gtt stopped.  Objective: Vital signs in last 24 hours: Vitals:   01/02/16 2314 01/03/16 0400 01/03/16 0700 01/03/16 1213  BP: 133/82 137/90 (!) 147/100 (!) 154/98  Pulse: (!) 118 (!) 117 (!) 116 (!) 121  Resp: 16 11 18 14   Temp: 99.3 F (37.4 C) 99.6 F (37.6 C) 99.1 F (37.3 C) 99.5 F (37.5 C)  TempSrc: Oral Oral Oral Oral  SpO2: 100% 100% 99% 97%  Weight:      Height:       Physical Exam: Physical Exam  Constitutional: She appears well-developed. She is cooperative. No distress.  Vomiting into bag during exam  Cardiovascular: Normal rate, regular rhythm, normal heart sounds and normal pulses.  Exam reveals no gallop.   No murmur heard. Pulmonary/Chest: Effort normal and breath sounds normal. No respiratory distress. Breasts are symmetrical.  Abdominal: Soft. Bowel sounds are normal. There is tenderness (mild TTP LUQ). There is no rebound and no guarding.  Musculoskeletal: She exhibits no edema.  Skin: She is diaphoretic (mild).   Labs: CBC:  Recent Labs Lab 01/02/16 0140 01/02/16 0700 01/03/16 0314  WBC 11.7*  --  15.9*  HGB 13.6 11.2* 11.4*  HCT 38.3 33.0* 33.2*  MCV 82.5  --  82.8  PLT 333  --  312   Metabolic Panel:  Recent Labs Lab 01/02/16 0140 01/02/16 0700 01/02/16 0929 01/02/16 1447 01/03/16 0314 01/03/16 1217  NA 136 142 141 143 139 137  K 3.8 3.7 3.6 3.5 3.2* 3.2*  CL 100* 106 109 116* 108 105  CO2 19*  --  21* 22 22 23   GLUCOSE 348* 318* 255* 130* 228* 168*  BUN 16 16 13 14 11 8   CREATININE 0.84 0.60 0.77 0.65 0.70 0.65  CALCIUM 10.3  --  9.2 8.8* 9.2 9.2  MG  --   --  1.6*  --   --   --   PHOS  --   --  3.5  --   --   --   ALT 17  --   --   --   --   --   ALKPHOS 62  --   --   --   --   --   BILITOT 1.5*  --   --   --   --   --   PROT 8.6*  --   --   --   --   --     ALBUMIN 4.5  --   --   --   --   --   LIPASE 17  --   --   --   --   --    Cardiac Labs:  Recent Labs Lab 01/02/16 0151  TROPIPOC 0.00   BG:  Recent Labs Lab 01/02/16 1826 01/02/16 2236 01/03/16 0730 01/03/16 1212 01/03/16 1613  GLUCAP 159* 178* 188* 161* 174*   Lab Results  Component Value Date   HGBA1C 9.7 (H) 01/02/2016    Medications: Infusions: . dextrose 5 % and 0.45 % NaCl with KCl 20 mEq/L     Scheduled Medications: . insulin aspart  0-5 Units Subcutaneous QHS  . insulin aspart  0-9 Units Subcutaneous TID WC  . insulin glargine  18 Units Subcutaneous Daily  . metoCLOPramide  10 mg Oral TID AC & HS  . pantoprazole  40 mg Oral Daily   PRN Medications: acetaminophen, gi cocktail, ketorolac, promethazine **OR** promethazine **OR** promethazine  Assessment/Plan: Pt is a 25 y.o. yo female with a PMHx of DM (unclear t1 or t2), HTN, GERD and ?gastroparesis who was admitted on 01/02/2016 with symptoms of N/V, abd pain, and hyperglycemia, which was determined to be secondary to DKA.  DKA: AG closed x2. Symptomatically improved, but still complains of N/V. Switched to Lantus w/ SSI qAC. - BMP qAM - q4 CBG - advance diet as tolerated. - Cont. D5 1/2 NS until tolerating good PO  DM: A1c was 14 in July, now 9.5. Noncompliant with her home medications. PTA insuline was Novolog 70/30 10 units BID. Will plan to switch to long-acting qHS insulin with qAC short-acting. - 18 U Lantus qHS - SSI qAC  Acute Exacerbation of Diabetic Gastroparesis: Previous records note gastroparesis, however, no documentation of GES. N/V may be 2/2 acute DKA. Will continue Reglan 10mg  QID for 2 to 8 weeks and plan outpt GES. - Cont. Reglan and Phenergan 25mg  q6hr PRN  Chest pain: Resolved.  Hypertension: Home HCTZ held. Consider switching to lisinopril at DC 2/2 DM.  GERD: Continue home Protonix 40mg  daily  Length of Stay: 1 day(s) Dispo: Anticipated discharge in approximately 1-2  day(s).  Carolynn CommentBryan Mairlyn Tegtmeyer, MD Pager: [phone] (7AM-5PM) 01/03/2016, 4:47 PM

## 2016-01-04 DIAGNOSIS — E1065 Type 1 diabetes mellitus with hyperglycemia: Secondary | ICD-10-CM

## 2016-01-04 DIAGNOSIS — R112 Nausea with vomiting, unspecified: Secondary | ICD-10-CM

## 2016-01-04 DIAGNOSIS — R000 Tachycardia, unspecified: Secondary | ICD-10-CM

## 2016-01-04 LAB — BASIC METABOLIC PANEL
Anion gap: 9 (ref 5–15)
BUN: 6 mg/dL (ref 6–20)
CHLORIDE: 102 mmol/L (ref 101–111)
CO2: 25 mmol/L (ref 22–32)
CREATININE: 0.58 mg/dL (ref 0.44–1.00)
Calcium: 9.2 mg/dL (ref 8.9–10.3)
GFR calc Af Amer: >60 mL/min (ref >60)
GFR calc non Af Amer: >60 mL/min (ref >60)
GLUCOSE: 242 mg/dL — AB (ref 65–99)
POTASSIUM: 3.4 mmol/L — AB (ref 3.5–5.1)
Sodium: 136 mmol/L (ref 135–145)

## 2016-01-04 LAB — GLUCOSE, CAPILLARY
GLUCOSE-CAPILLARY: 214 mg/dL — AB (ref 65–99)
GLUCOSE-CAPILLARY: 232 mg/dL — AB (ref 65–99)
GLUCOSE-CAPILLARY: 172 mg/dL — AB (ref 65–99)
Glucose-Capillary: 216 mg/dL — ABNORMAL HIGH (ref 65–99)

## 2016-01-04 LAB — MAGNESIUM: Magnesium: 1.6 mg/dL — ABNORMAL LOW (ref 1.7–2.4)

## 2016-01-04 LAB — CBC
HEMATOCRIT: 33.2 % — AB (ref 36.0–46.0)
Hemoglobin: 11.7 g/dL — ABNORMAL LOW (ref 12.0–15.0)
MCH: 28.7 pg (ref 26.0–34.0)
MCHC: 35.2 g/dL (ref 30.0–36.0)
MCV: 81.6 fL (ref 78.0–100.0)
PLATELETS: 306 10*3/uL (ref 150–400)
RBC: 4.07 MIL/uL (ref 3.87–5.11)
RDW: 12 % (ref 11.5–15.5)
WBC: 10.2 10*3/uL (ref 4.0–10.5)

## 2016-01-04 MED ORDER — GLUCERNA SHAKE PO LIQD
237.0000 mL | Freq: Two times a day (BID) | ORAL | Status: DC
  Administered 2016-01-04: 237 mL via ORAL

## 2016-01-04 MED ORDER — INSULIN ASPART 100 UNIT/ML ~~LOC~~ SOLN: 5.0000 [IU] | Freq: Three times a day (TID) | SUBCUTANEOUS | 11 refills | Status: DC

## 2016-01-04 MED ORDER — METOCLOPRAMIDE HCL 10 MG PO TABS: 10.0000 mg | ORAL_TABLET | Freq: Three times a day (TID) | ORAL | 1 refills | Status: DC

## 2016-01-04 MED ORDER — INSULIN ASPART 100 UNIT/ML ~~LOC~~ SOLN
0.0000 [IU] | Freq: Three times a day (TID) | SUBCUTANEOUS | Status: DC
  Administered 2016-01-04 (×3): 5 [IU] via SUBCUTANEOUS
  Administered 2016-01-05: 8 [IU] via SUBCUTANEOUS

## 2016-01-04 MED ORDER — INSULIN ASPART 100 UNIT/ML ~~LOC~~ SOLN: 0.0000 [IU] | Freq: Every day | SUBCUTANEOUS | Status: DC

## 2016-01-04 MED ORDER — INSULIN GLARGINE 100 UNIT/ML ~~LOC~~ SOLN: 15.0000 [IU] | Freq: Every day | SUBCUTANEOUS | 11 refills | Status: DC

## 2016-01-04 NOTE — Progress Notes (Signed)
Inpatient Diabetes Program Recommendations  AACE/ADA: New Consensus Statement on Inpatient Glycemic Control (2015)  Target Ranges:  Prepandial:   less than 140 mg/dL      Peak postprandial:   less than 180 mg/dL (1-2 hours)      Critically ill patients:  140 - 180 mg/dL   Lab Results  Component Value Date   GLUCAP 232 (H) 01/04/2016   HGBA1C 9.7 (H) 01/02/2016    Review of Glycemic Control Results for [name], Kelli Hudson (MRN [id number]) as of 01/04/2016 12:29  Ref. Range 01/03/2016 12:12 01/03/2016 16:13 01/03/2016 21:21 01/04/2016 07:39 01/04/2016 12:15  Glucose-Capillary Latest Ref Range: 65 - 99 mg/dL 409161 (H) 811174 (H) 914202 (H) 214 (H) 232 (H)   Inpatient Diabetes Program Recommendations:  Spoke with patient @ bedside. Patient states nurses reviewed the insulin pen with her and she has used the Lantus pen in the past. Reviewed basic principles with patient and patient answered questions appropriately. Patient states she changed from Lantus due to not able to afford to purchase. Gave patient a $0 copay card to use with Lantus since she has insurance. Will follow.  Thank you, Billy FischerJudy E. Abrina Petz, RN, MSN, CDE Inpatient Glycemic Control Team Team Pager [phone] (8am-5pm) 01/04/2016 12:34 PM

## 2016-01-04 NOTE — Progress Notes (Signed)
Initial Nutrition Assessment  DOCUMENTATION CODES:   Not applicable  INTERVENTION:    Glucerna Shake po BID, each supplement provides 220 kcal and 10 grams of protein  NUTRITION DIAGNOSIS:   Inadequate oral intake related to poor appetite as evidenced by per patient/family report  GOAL:   Patient will meet greater than or equal to 90% of their needs  MONITOR:   PO intake, Supplement acceptance, Labs, Weight trends, I & O's  REASON FOR ASSESSMENT:   Malnutrition Screening Tool  ASSESSMENT:   25 yo Female with a history of insulin-dependent diabetes for 2 years came to the emergency department because of nausea, vomiting, and diffuse abdominal pain. She has had one other episode of DKA requiring hospitalization this year. She says her physicians are unsure if she has type I or type 2 diabetes. She has had poor compliance with her current insulin regimen, says that she would not take the intermediate acting insulin if she was not eating.  Patient reports a poor appetite. PO intake 0-10% per flowsheet records. Also reveals she's lost 10 lbs in the last several weeks >> likely due to uncontrolled DM. Feel she'd benefit from oral nutrition supplements during hospitalization >> will order. CBG's [phone].  Nutrition focused physical exam completed.  No muscle or subcutaneous fat depletion noticed.  Diet Order:  Diet Carb Modified Fluid consistency: Thin; Room service appropriate? Yes  Skin:  Reviewed, no issues  Last BM:  11/16  Height:   Ht Readings from Last 1 Encounters:  01/02/16 5\' 7"  (1.702 m)    Weight:   Wt Readings from Last 1 Encounters:  01/02/16 159 lb (72.1 kg)    Ideal Body Weight:  61.3 kg  BMI:  Body mass index is 24.9 kg/m.  Estimated Nutritional Needs:   Kcal:  1800-2000  Protein:  90-100 gm  Fluid:  1.8-2.0 L  EDUCATION NEEDS:   No education needs identified at this time  Maureen ChattersKatie Severina Sykora, RD, LDN Pager #: [phone] After-Hours  Pager #: [phone]

## 2016-01-04 NOTE — Progress Notes (Signed)
Subjective: Currently, the patient is feeling much better with only mild nasea and no vomitting. She was able to keep some food down yesterday evening and is optimistic to try breakfast this AM. She is agreeable to transitioning from 70/30 insulin to Lantus qHS + Short-Acting qAC. We also discussed improving compliance by switching from insulin vials to insulin pens.  Interval Events: Ate small amount of dinner. Remains mildly tachycardic.  Objective: Vital signs in last 24 hours: Vitals:   01/04/16 0042 01/04/16 0300 01/04/16 0314 01/04/16 0739  BP: (!) 136/91 124/89 124/89 132/87  Pulse: (!) 116 (!) 109 (!) 113 (!) 117  Resp: 16 13 16  (!) 22  Temp: 99.4 F (37.4 C) 99.2 F (37.3 C)  99.4 F (37.4 C)  TempSrc: Oral Oral  Oral  SpO2: 100% 98% 100% 99%  Weight:      Height:       Physical Exam: Physical Exam  Constitutional: She appears well-developed. She is cooperative. No distress.  Cardiovascular: Regular rhythm, normal heart sounds and normal pulses.  Tachycardia present.  Exam reveals no gallop.   No murmur heard. Pulmonary/Chest: Effort normal and breath sounds normal. No respiratory distress. Breasts are symmetrical.  Abdominal: Soft. Bowel sounds are normal. There is no tenderness. There is no rebound and no guarding.  Musculoskeletal: She exhibits no edema.  Skin: She is not diaphoretic.   Labs: CBC:  Recent Labs Lab 01/02/16 0140 01/02/16 0700 01/03/16 0314 01/04/16 0313  WBC 11.7*  --  15.9* 10.2  HGB 13.6 11.2* 11.4* 11.7*  HCT 38.3 33.0* 33.2* 33.2*  MCV 82.5  --  82.8 81.6  PLT 333  --  312 306   Metabolic Panel:  Recent Labs Lab 01/02/16 0140  01/02/16 0929 01/02/16 1447 01/03/16 0314 01/03/16 1217 01/04/16 0313  NA 136  < > 141 143 139 137 136  K 3.8  < > 3.6 3.5 3.2* 3.2* 3.4*  CL 100*  < > 109 116* 108 105 102  CO2 19*  --  21* 22 22 23 25   GLUCOSE 348*  < > 255* 130* 228* 168* 242*  BUN 16  < > 13 14 11 8 6   CREATININE 0.84  < >  0.77 0.65 0.70 0.65 0.58  CALCIUM 10.3  --  9.2 8.8* 9.2 9.2 9.2  MG  --   --  1.6*  --   --   --  1.6*  PHOS  --   --  3.5  --   --   --   --   ALT 17  --   --   --   --   --   --   ALKPHOS 62  --   --   --   --   --   --   BILITOT 1.5*  --   --   --   --   --   --   PROT 8.6*  --   --   --   --   --   --   ALBUMIN 4.5  --   --   --   --   --   --   LIPASE 17  --   --   --   --   --   --    BG:  Recent Labs Lab 01/02/16 2236 01/03/16 0730 01/03/16 1212 01/03/16 1613 01/03/16 2121  GLUCAP 178* 188* 161* 174* 202*     Lab Results  Component Value Date  HGBA1C 9.7 (H) 01/02/2016     Medications: Infusions: . dextrose 5 % and 0.45 % NaCl with KCl 20 mEq/L 125 mL/hr at 01/04/16 0041   Scheduled Medications: . insulin aspart  0-5 Units Subcutaneous QHS  . insulin aspart  0-9 Units Subcutaneous TID WC  . insulin glargine  18 Units Subcutaneous Daily  . metoCLOPramide  10 mg Oral TID AC & HS  . pantoprazole  40 mg Oral Daily   PRN Medications: acetaminophen, gi cocktail, ketorolac, promethazine **OR** promethazine **OR** promethazine  Assessment/Plan: Pt is a 25 y.o. yo female with a PMHx of DM (unclear t1 or t2), HTN, GERD and ?gastroparesis who was admitted on 01/02/2016 with symptoms of N/V, abd pain, and hyperglycemia, which was determined to be secondary to DKA.  DKA: Resolved. Symptomatically improved, but still complains of N/V. - BMP qAM - q4 CBG - advance diet as tolerated. - Cont. D5 1/2 NS until tolerating good PO  DM: A1c was 14 in July, now 9.7. Noncompliant with PTA insuline Novolog 70/30 10 units BID. Will plan to switch to long-acting qHS insulin with qAC short-acting. - Cont 18 U Lantus qHS - switch to moderate SSI qAC/qHS  Nasuea/Vomitting: Previous records note gastroparesis, however, no documentation of GES. N/V may be 2/2 acute DKA. Will continue Reglan 10mg  QID for 2 to 8 weeks and plan outpt GES. - Cont. Reglan and Phenergan 25mg  q6hr  PRN  Hypertension: Home HCTZ held. Consider switching to lisinopril at DC 2/2 DM.  GERD: Continue home Protonix 40mg  daily  Length of Stay: 2 day(s) Dispo: Anticipated discharge in approximately 1-2 day(s).  Carolynn CommentBryan Doyle Tegethoff, MD Pager: [phone] (7AM-5PM) 01/04/2016, 7:41 AM

## 2016-01-04 NOTE — Progress Notes (Signed)
Per Pt. Bath has been done. 

## 2016-01-04 NOTE — Progress Notes (Signed)
PharmD candidates rounding with Internal Medicine teaching service. Asked to counsel on insulin pen administration.  Counseled patient on her insulin regimen regarding proper pen administration, storage, and timing of basal versus bolus insulin.  Presley RaddleHaley Meeghan Skipper and John GiovanniMeredith McSwain PharmD Candidates

## 2016-01-04 NOTE — Discharge Summary (Signed)
Name: Kelli Hudson MRN: [id number] DOB: 03/24/1990 25 y.o. PCP: Arnoldo Morale, MD  Date of Admission: 01/02/2016  2:34 AM Date of Discharge: 01/05/2016 Attending Physician: Dr. Lalla Brothers Discharge Diagnosis: Active Problems:   Diabetic ketoacidosis without coma associated with type 2 diabetes mellitus ([place])   Gastroparesis   Discharge Medications:   Medication List    STOP taking these medications   insulin aspart protamine- aspart (70-30) 100 UNIT/ML injection Commonly known as:  NOVOLOG MIX 70/30     TAKE these medications   glucose blood test strip Commonly known as:  TRUE METRIX BLOOD GLUCOSE TEST Use as instructed   hydrochlorothiazide 12.5 MG tablet Commonly known as:  HYDRODIURIL Take 1 tablet (12.5 mg total) by mouth daily.   Insulin Syringe-Needle U-100 31G X 15/64" 0.5 ML Misc Commonly known as:  BD INSULIN SYRINGE ULTRAFINE 1 each by Does not apply route 2 (two) times daily.   metoCLOPramide 10 MG tablet Commonly known as:  REGLAN Take 1 tablet (10 mg total) by mouth 4 (four) times daily -  before meals and at bedtime.   pantoprazole 40 MG tablet Commonly known as:  PROTONIX Take 1 tablet (40 mg total) by mouth daily.   TRUE METRIX METER w/Device Kit 1 Device by Does not apply route 4 (four) times daily.   TRUEPLUS LANCETS 28G Misc assist with checking blood sugar TID and qhs       Disposition and follow-up:   Kelli Hudson was discharged from [facility] in Good condition.  At the hospital follow up visit please address:  1.  DM: Please assess BG control on new insulin regimen and compliance w/ qHS/qAC dosing. F/u on N/V w/ regards to possible gastroparesis and consider GES for definitive evaluation.  2. Hyperthyroidism: Please recheck thyroid levels, consider radioiodine scan for possible hyperthyroidism. Concern for autoimmune thyroiditis or graves.  2.  Labs / imaging needed at time of follow-up:  CBG, TSH, consider repeat free T4  Follow-up Appointments: Follow-up Information    Arnoldo Morale, MD. Call on 01/07/2016.   Specialty:  Family Medicine Why:  Call the clinic at 9:00am on Monday to make a hospital follow-up appointment. You may experience a longer wait on the phone, but please be patient as it will be improtant for you to see you doctor. Contact information: [place] [address] Hospital Course by problem list: Active Problems:   Diabetic ketoacidosis without coma associated with type 2 diabetes mellitus ([facility])   Gastroparesis   1. DKA: Pt presented with c/o N/V, abd pain, and hyperglycemia and found to have ketoacidosis w/ BG of 348. She was started on insulin gtt and monitored to establish resolution of AG. Her N/V continued to resolve with resolution of metabolic derangement and administration of Reglan and phenergan. She was able to tolerate PO intake by HD 2 and was discharged with f/u at her PCP. Her insulin regimen was altered from BID 70/30 Novalog to Lantus qHS + Short-acting qAC for better fasting glucose control given her presentation suggesting of gross insulin deficiency w/ significant insulin sensitivity more consistent with type 1 vs type 2 DM. She was also discharged with a 6-8 week trail of Reglan for concern of gastroparesis which was noted on prior hospital records. However we recommend that she follow up with a formal GES as an outpatient. Pt also reports poor PO intake at home due to poor appetite.  We recommend continuation of nutritional supplements and consultation with diabetes coordinator/nutrition as an outpatient.  2. Hyperthyroidism: After pt's acute illness was treated, she continued to have mild tachycardia to the 110s-120s. Review of the EMR revealed persistent mild tachycardia without documented explanation, although it appears she has been on metoprolol in the past. Given her complaints of  unintended wt loss over the last 2 months, and persistent unexplained tachycardia, TSH and free T4 levels were checked and demonstrated hyperthyroidism. She does not have a goiter on exam and other ROS for hyperthyroidism were negative. We recommend follow-up as an outpt with recheck of TSH/T4 and consideration of antibody test/radioiodine scan for evaluation of autoimmune thyroiditis vs graves disease.  Discharge Vitals:   BP 130/85 (BP Location: Right Arm)   Pulse (!) 108   Temp 99.7 F (37.6 C) (Oral)   Resp 16   Ht _0  (1.702 m)   Wt 72.8 kg (160 lb 7.9 oz)   LMP 11/18/2015 (Within Weeks)   SpO2 100%   BMI 25.14 kg/m   Pertinent Labs, Studies, and Procedures: Results for JORDANA, DUGUE (MRN [id number]) as of 01/06/2016 07:05  Ref. Range 01/05/2016 10:13  TSH Latest Ref Range: 0.350 - 4.500 uIU/mL 0.587  T4,Free(Direct) Latest Ref Range: 0.61 - 1.12 ng/dL 1.38 (H)   Procedures Performed:  Dg Chest 2 View  Result Date: 01/02/2016 CLINICAL DATA:  Acute onset of mid to left-sided chest pain and nausea and vomiting. Initial encounter. EXAM: CHEST  2 VIEW COMPARISON:  Chest radiograph performed 09/03/2015, and CTA of the chest performed 09/07/2015 FINDINGS: The lungs are well-aerated and clear. There is no evidence of focal opacification, pleural effusion or pneumothorax. The heart is normal in size; the mediastinal contour is within normal limits. No acute osseous abnormalities are seen. IMPRESSION: No acute cardiopulmonary process seen. Electronically Signed   By: Garald Balding M.D.   On: 01/02/2016 02:11   Discharge Instructions: Discharge Instructions    Call MD for:  difficulty breathing, headache or visual disturbances    Complete by:  As directed    Call MD for:  persistant dizziness or light-headedness    Complete by:  As directed    Call MD for:  persistant nausea and vomiting    Complete by:  As directed    Diet Carb Modified    Complete by:  As directed     Discharge instructions    Complete by:  As directed    Your insulin regimen will be adjusted to better control your blood sugar even when you are not eating. This new regimen will include a long-acting insulin called Lantus which you will take at night before bed and will give you 24hr coverage. Take 15 units before bed. You will also have a short-acting Aspart insulin to take before each meal. Take 5 units before bed.  You should follow-up with your Primary Doctor within 1 week to check on your insulin regimen and to continue to investigate your problems with poor appetite, weight-loss, and nausea. We will continue your Reglan which you can take before each meal and before bed to help with your nausea and your Primary Doctor will be able to evaluate whether this is helping.   Increase activity slowly    Complete by:  As directed       Signed: Holley Raring, MD 01/06/2016, 7:05 AM   Pager: [phone]

## 2016-01-05 ENCOUNTER — Telehealth: Payer: Self-pay | Admitting: Internal Medicine

## 2016-01-05 LAB — GLUCOSE, CAPILLARY: Glucose-Capillary: 260 mg/dL — ABNORMAL HIGH (ref 65–99)

## 2016-01-05 LAB — TSH: TSH: 0.587 u[IU]/mL (ref 0.350–4.500)

## 2016-01-05 LAB — T4, FREE: FREE T4: 1.38 ng/dL — AB (ref 0.61–1.12)

## 2016-01-05 MED ORDER — INSULIN ASPART 100 UNIT/ML FLEXPEN: 5.0000 [IU] | PEN_INJECTOR | Freq: Three times a day (TID) | SUBCUTANEOUS | 11 refills | Status: DC

## 2016-01-05 MED ORDER — INSULIN DETEMIR 100 UNIT/ML FLEXPEN: 15.0000 [IU] | PEN_INJECTOR | Freq: Every day | SUBCUTANEOUS | 11 refills | Status: DC

## 2016-01-05 NOTE — Progress Notes (Signed)
IV removed. AVS given to patient. Understanding verbalized.  Transportation arranged by patient.  Belongings packed.

## 2016-01-05 NOTE — Telephone Encounter (Signed)
Called by pt to change the insulins vials to pens.  -Did the above and sent prescription to requested pharmacy

## 2016-01-05 NOTE — Progress Notes (Signed)
Subjective: She has some improvement in appetite last night. She reports mild nausea this morning that improved with food. Seen eating breakfast without complaints.    Objective: Vital signs in last 24 hours: Vitals:   01/04/16 0042 01/04/16 0300 01/04/16 0314 01/04/16 0739  BP: (!) 136/91 124/89 124/89 132/87  Pulse: (!) 116 (!) 109 (!) 113 (!) 117  Resp: 16 13 16  (!) 22  Temp: 99.4 F (37.4 C) 99.2 F (37.3 C)  99.4 F (37.4 C)  TempSrc: Oral Oral  Oral  SpO2: 100% 98% 100% 99%  Weight:      Height:       Physical Exam: Physical Exam  Constitutional: She is cooperative. No distress.  Cardiovascular: Regular rhythm, normal heart sounds and normal pulses.  Tachycardia present.   No murmur heard. Pulmonary/Chest: Effort normal and breath sounds normal. No respiratory distress. Breasts are symmetrical.  Abdominal: Soft. There is no tenderness. There is no rebound and no guarding.  Bowel sounds diffusely decrease but present throughout.  Musculoskeletal: She exhibits no edema.  Skin: Skin is warm and dry.   Labs: CBC:  Recent Labs Lab 01/02/16 0140 01/02/16 0700 01/03/16 0314 01/04/16 0313  WBC 11.7*  --  15.9* 10.2  HGB 13.6 11.2* 11.4* 11.7*  HCT 38.3 33.0* 33.2* 33.2*  MCV 82.5  --  82.8 81.6  PLT 333  --  312 306   Metabolic Panel:  Recent Labs Lab 01/02/16 0140  01/02/16 0929 01/02/16 1447 01/03/16 0314 01/03/16 1217 01/04/16 0313  NA 136  < > 141 143 139 137 136  K 3.8  < > 3.6 3.5 3.2* 3.2* 3.4*  CL 100*  < > 109 116* 108 105 102  CO2 19*  --  21* 22 22 23 25   GLUCOSE 348*  < > 255* 130* 228* 168* 242*  BUN 16  < > 13 14 11 8 6   CREATININE 0.84  < > 0.77 0.65 0.70 0.65 0.58  CALCIUM 10.3  --  9.2 8.8* 9.2 9.2 9.2  MG  --   --  1.6*  --   --   --  1.6*  PHOS  --   --  3.5  --   --   --   --   ALT 17  --   --   --   --   --   --   ALKPHOS 62  --   --   --   --   --   --   BILITOT 1.5*  --   --   --   --   --   --   PROT 8.6*  --   --   --    --   --   --   ALBUMIN 4.5  --   --   --   --   --   --   LIPASE 17  --   --   --   --   --   --    BG:  Recent Labs Lab 01/02/16 2236 01/03/16 0730 01/03/16 1212 01/03/16 1613 01/03/16 2121  GLUCAP 178* 188* 161* 174* 202*     Lab Results  Component Value Date   HGBA1C 9.7 (H) 01/02/2016     Medications: Infusions: . dextrose 5 % and 0.45 % NaCl with KCl 20 mEq/L 125 mL/hr at 01/04/16 0041   Scheduled Medications: . insulin aspart  0-5 Units Subcutaneous QHS  . insulin aspart  0-9 Units Subcutaneous TID  WC  . insulin glargine  18 Units Subcutaneous Daily  . metoCLOPramide  10 mg Oral TID AC & HS  . pantoprazole  40 mg Oral Daily   PRN Medications: acetaminophen, gi cocktail, ketorolac, promethazine **OR** promethazine **OR** promethazine  Assessment/Plan: Pt is a 25 y.o. yo female with a PMHx of DM (unclear t1 or t2), HTN, GERD and possibly gastroparesis who was admitted on 01/02/2016 with symptoms of N/V, abd pain, and hyperglycemia, which was determined to be secondary to DKA.  DKA: Resolved. She continues to have mildly decreased bowel sounds and constipation but is tolerating improved intake. This acute event and dehydration are likely exacerbating any underlying gastrointestinal motility problems. -Continue carb modified diet  DM: A1c was 14 in July, now 9.7. She became uncontrolled with Novolog 70/30 10 units at meals due to not eating regularly. We will change to a basal/bolus regimen that should help moderate this problem with 50% basal dosing. -d/c with lantus 15U daily and insulin aspart 5U TIDAC to be titrated in follow up visits   Nasuea/Vomitting: Previous records note gastroparesis, however, no documentation of GES. N/V may be 2/2 acute DKA. Will continue Reglan 10mg  QID for 2 to 8 weeks and plan outpt GES. - Continue Reglan TID  Hypertension: Home HCTZ held during admission. Would recommend considering ACE/ARB at her follow up.  GERD: Continue home  Protonix 40mg  daily  Length of Stay: 2 day(s) Dispo: Anticipated discharge later today   Fuller Planhristopher W Annick Dimaio, MD PGY-II Internal Medicine Resident Pager# [phone] 01/05/2016, 9:53 AM

## 2016-01-06 ENCOUNTER — Emergency Department (HOSPITAL_COMMUNITY): Payer: BLUE CROSS/BLUE SHIELD

## 2016-01-06 ENCOUNTER — Encounter (HOSPITAL_COMMUNITY): Payer: Self-pay | Admitting: Emergency Medicine

## 2016-01-06 ENCOUNTER — Emergency Department (HOSPITAL_COMMUNITY)
Admission: EM | Admit: 2016-01-06 | Discharge: 2016-01-06 | Disposition: A | Discharge status: Home / Self Care | Payer: BLUE CROSS/BLUE SHIELD | Attending: Emergency Medicine | Admitting: Emergency Medicine

## 2016-01-06 ENCOUNTER — Telehealth: Payer: Self-pay | Admitting: Internal Medicine

## 2016-01-06 DIAGNOSIS — R079 Chest pain, unspecified: Secondary | ICD-10-CM | POA: Diagnosis present

## 2016-01-06 DIAGNOSIS — E119 Type 2 diabetes mellitus without complications: Secondary | ICD-10-CM | POA: Insufficient documentation

## 2016-01-06 DIAGNOSIS — Z794 Long term (current) use of insulin: Secondary | ICD-10-CM | POA: Insufficient documentation

## 2016-01-06 DIAGNOSIS — Z87891 Personal history of nicotine dependence: Secondary | ICD-10-CM | POA: Diagnosis not present

## 2016-01-06 DIAGNOSIS — Z79899 Other long term (current) drug therapy: Secondary | ICD-10-CM | POA: Insufficient documentation

## 2016-01-06 LAB — I-STAT TROPONIN, ED: TROPONIN I, POC: 0 ng/mL (ref 0.00–0.08)

## 2016-01-06 LAB — I-STAT BETA HCG BLOOD, ED (MC, WL, AP ONLY): I-stat hCG, quantitative: <5 m[IU]/mL (ref <5)

## 2016-01-06 LAB — CBC
HCT: 37.2 % (ref 36.0–46.0)
Hemoglobin: 13.3 g/dL (ref 12.0–15.0)
MCH: 29.1 pg (ref 26.0–34.0)
MCHC: 35.8 g/dL (ref 30.0–36.0)
MCV: 81.4 fL (ref 78.0–100.0)
PLATELETS: 385 10*3/uL (ref 150–400)
RBC: 4.57 MIL/uL (ref 3.87–5.11)
RDW: 12.1 % (ref 11.5–15.5)
WBC: 10.1 10*3/uL (ref 4.0–10.5)

## 2016-01-06 LAB — BASIC METABOLIC PANEL
Anion gap: 15 (ref 5–15)
BUN: 11 mg/dL (ref 6–20)
CALCIUM: 10 mg/dL (ref 8.9–10.3)
CHLORIDE: 100 mmol/L — AB (ref 101–111)
CO2: 20 mmol/L — ABNORMAL LOW (ref 22–32)
CREATININE: 0.9 mg/dL (ref 0.44–1.00)
Glucose, Bld: 209 mg/dL — ABNORMAL HIGH (ref 65–99)
Potassium: 3.2 mmol/L — ABNORMAL LOW (ref 3.5–5.1)
SODIUM: 135 mmol/L (ref 135–145)

## 2016-01-06 LAB — CBG MONITORING, ED: GLUCOSE-CAPILLARY: 166 mg/dL — AB (ref 65–99)

## 2016-01-06 LAB — T3: T3 TOTAL: 85 ng/dL (ref 71–180)

## 2016-01-06 MED ORDER — METOPROLOL TARTRATE 25 MG PO TABS
25.0000 mg | ORAL_TABLET | Freq: Once | ORAL | Status: AC
  Administered 2016-01-06: 25 mg via ORAL
  Filled 2016-01-06: qty 1

## 2016-01-06 MED ORDER — KETOROLAC TROMETHAMINE 30 MG/ML IJ SOLN
30.0000 mg | Freq: Once | INTRAMUSCULAR | Status: AC
  Administered 2016-01-06: 30 mg via INTRAVENOUS
  Filled 2016-01-06: qty 1

## 2016-01-06 MED ORDER — MORPHINE SULFATE (PF) 4 MG/ML IV SOLN
4.0000 mg | Freq: Once | INTRAVENOUS | Status: AC
  Administered 2016-01-06: 4 mg via INTRAVENOUS
  Filled 2016-01-06: qty 1

## 2016-01-06 MED ORDER — HYDROCODONE-ACETAMINOPHEN 5-325 MG PO TABS: 1.0000 | ORAL_TABLET | Freq: Four times a day (QID) | ORAL | 0 refills | Status: DC | PRN

## 2016-01-06 MED ORDER — SODIUM CHLORIDE 0.9 % IV BOLUS (SEPSIS)
1000.0000 mL | Freq: Once | INTRAVENOUS | Status: AC
  Administered 2016-01-06: 1000 mL via INTRAVENOUS

## 2016-01-06 MED ORDER — IOPAMIDOL (ISOVUE-370) INJECTION 76%
INTRAVENOUS | Status: AC
  Administered 2016-01-06: 100 mL
  Filled 2016-01-06: qty 100

## 2016-01-06 NOTE — Telephone Encounter (Signed)
  Reason for call:   I placed an outgoing call to [name] at 9:00 AM regarding her TSH and free T4 test results from her recent hospitalization on 01/02/16.   Assessment/ Plan:   [name] answered the phone and I relayed the results of her recent TSH and free T4 laboratory tests which resulted after her discharge yesterday 11/18.  I explained that the low TSH and elevated free T4 were indicators of a hyperactive thyroid gland which may need further evaluation and treatment.  I explained that these results may help to explain her elevated HR and recent wt loss.  I also explained that these results would be communicated with her PCP and that she should be sure to follow up with them within the next 1-2 weeks.  As always, pt is advised that if symptoms worsen or new symptoms arise, they should go to an urgent care facility or to to ER for further evaluation.   Carolynn CommentBryan Johney Perotti, MD   01/06/2016, 9:08 AM

## 2016-01-06 NOTE — ED Triage Notes (Signed)
Wife stated, she started having chest pain about 1 hour ago and then came the SOB

## 2016-01-06 NOTE — Discharge Instructions (Signed)
Ibuprofen 600 mg 3 times daily for the next several days.  Hydrocodone as prescribed as needed for pain not relieved with ibuprofen.  Follow-up with your primary doctor later this week, and return to the emergency department if your symptoms significantly worsen or change.

## 2016-01-06 NOTE — ED Provider Notes (Signed)
[facility] DEPT Provider Note   CSN: [id number] Arrival date & time: 01/06/16  1433     History   Chief Complaint Chief Complaint  Patient presents with  . Chest Pain  . Shortness of Breath    HPI Kelli Hudson is a 25 y.o. female.  Patient is a 25 year old female with past medical history of type 1 diabetes. She presents with complaints of chest pain. She was recently admitted for ketoacidosis and vomiting. She now presents having difficulty breathing and sating she has sharp pain in the center of her chest. It is worse with movement and inspiration. She denies any fevers, chills, or productive cough.    Chest Pain   This is a new problem. The current episode started yesterday. The problem occurs constantly. The problem has been rapidly worsening. The pain is associated with movement, coughing and breathing. The pain is severe. The pain does not radiate. Associated symptoms include cough. Pertinent negatives include no diaphoresis, no dizziness and no palpitations. She has tried nothing for the symptoms. There are no known risk factors.    Past Medical History:  Diagnosis Date  . Diabetes mellitus without complication [facility])     Patient Active Problem List   Diagnosis Date Noted  . Diabetic ketoacidosis without coma associated with type 2 diabetes mellitus ([place]) 01/02/2016  . Gastroparesis   . Leukocytosis   . Hyperglycemia   . Abdominal pain   . Diabetes mellitus due to underlying condition, uncontrolled, with hyperosmolarity without coma, without long-term current use of insulin ([place])   . THYROMEGALY 10/05/2009  . Uncontrolled diabetes mellitus ([place]) 10/05/2009  . Essential hypertension 10/05/2009    Past Surgical History:  Procedure Laterality Date  . ESOPHAGOGASTRODUODENOSCOPY (EGD) WITH PROPOFOL Left 09/08/2015   Procedure: ESOPHAGOGASTRODUODENOSCOPY (EGD) WITH PROPOFOL;  Surgeon: Arta Silence, MD;  Location: [facility] ENDOSCOPY;  Service: Endoscopy;   Laterality: Left;    OB History    No data available       Home Medications    Prior to Admission medications   Medication Sig Start Date End Date Taking? Authorizing Provider  Blood Glucose Monitoring Suppl (TRUE METRIX METER) w/Device KIT 1 Device by Does not apply route 4 (four) times daily. 09/10/15   Brayton Caves, PA-C  glucose blood (TRUE METRIX BLOOD GLUCOSE TEST) test strip Use as instructed 09/10/15   Brayton Caves, PA-C  hydrochlorothiazide (HYDRODIURIL) 12.5 MG tablet Take 1 tablet (12.5 mg total) by mouth daily. 09/19/15   Arnoldo Morale, MD  insulin aspart (NOVOLOG FLEXPEN) 100 UNIT/ML FlexPen Inject 5 Units into the skin 3 (three) times daily with meals. 01/05/16   Burgess Estelle, MD  Insulin Detemir (LEVEMIR FLEXPEN) 100 UNIT/ML Pen Inject 15 Units into the skin daily at 10 pm. 01/05/16   Burgess Estelle, MD  Insulin Syringe-Needle U-100 (BD INSULIN SYRINGE ULTRAFINE) 31G X 15/64" 0.5 ML MISC 1 each by Does not apply route 2 (two) times daily. 09/19/15   Arnoldo Morale, MD  metoCLOPramide (REGLAN) 10 MG tablet Take 1 tablet (10 mg total) by mouth 4 (four) times daily -  before meals and at bedtime. 01/04/16   Holley Raring, MD  pantoprazole (PROTONIX) 40 MG tablet Take 1 tablet (40 mg total) by mouth daily. 09/09/15   Reyne Dumas, MD  TRUEPLUS LANCETS 28G MISC assist with checking blood sugar TID and qhs 09/10/15   Brayton Caves, PA-C    Family History Family History  Problem Relation Age of Onset  . Lung cancer Mother   .  Bipolar disorder Father     Social History Social History  Substance Use Topics  . Smoking status: Former Research scientist (life sciences)  . Smokeless tobacco: Never Used  . Alcohol use 0.6 oz/week    1 Shots of liquor per week     Comment: occasional      Allergies   Patient has no known allergies.   Review of Systems Review of Systems  Constitutional: Negative for diaphoresis.  Respiratory: Positive for cough.   Cardiovascular: Positive for chest pain. Negative for  palpitations.  Neurological: Negative for dizziness.  All other systems reviewed and are negative.    Physical Exam Updated Vital Signs Ht 5' 7"  (1.702 m)   Wt 160 lb (72.6 kg)   LMP 01/04/2016   BMI 25.06 kg/m   Physical Exam  Constitutional: She is oriented to person, place, and time. She appears well-developed and well-nourished. No distress.  HENT:  Head: Normocephalic and atraumatic.  Neck: Normal range of motion. Neck supple.  Cardiovascular: Normal rate and regular rhythm.  Exam reveals no gallop and no friction rub.   No murmur heard. Pulmonary/Chest: Effort normal and breath sounds normal. No respiratory distress. She has no wheezes. She exhibits tenderness.  There is tenderness to palpation of the anterior chest wall. This reproduces her symptoms.  Abdominal: Soft. Bowel sounds are normal. She exhibits no distension. There is no tenderness.  Musculoskeletal: Normal range of motion.  Neurological: She is alert and oriented to person, place, and time.  Skin: Skin is warm and dry. She is not diaphoretic.  Nursing note and vitals reviewed.    ED Treatments / Results  Labs (all labs ordered are listed, but only abnormal results are displayed) Labs Reviewed  BASIC METABOLIC PANEL - Abnormal; Notable for the following:       Result Value   Potassium 3.2 (*)    Chloride 100 (*)    CO2 20 (*)    Glucose, Bld 209 (*)    All other components within normal limits  CBG MONITORING, ED - Abnormal; Notable for the following:    Glucose-Capillary 166 (*)    All other components within normal limits  CBC  I-STAT TROPOININ, ED    EKG  EKG Interpretation  Date/Time:  Sunday January 06 2016 14:37:35 EST Ventricular Rate:  164 PR Interval:    QRS Duration: 78 QT Interval:  302 QTC Calculation: 498 R Axis:   78 Text Interpretation:  Sinus tachycardia with short PR Septal infarct , age undetermined Abnormal ECG No significant change since 01/02/2016 Confirmed by Khamryn Calderone   MD, Charlsey Moragne ([id number]) on 01/06/2016 4:26:05 PM       Radiology Dg Chest 2 View  Result Date: 01/06/2016 CLINICAL DATA:  Gastroparesis and dehydration. Shortness of breath and chest pain. EXAM: CHEST  2 VIEW COMPARISON:  January 02, 2016 FINDINGS: The heart, hila, and mediastinum are normal. No pneumothorax. No pulmonary nodules, masses, or focal infiltrates. IMPRESSION: No active cardiopulmonary disease. Electronically Signed   By: Dorise Bullion III M.D   On: 01/06/2016 15:37    Procedures Procedures (including critical care time)  Medications Ordered in ED Medications  sodium chloride 0.9 % bolus 1,000 mL (not administered)  ketorolac (TORADOL) 30 MG/ML injection 30 mg (not administered)  morphine 4 MG/ML injection 4 mg (not administered)     Initial Impression / Assessment and Plan / ED Course  I have reviewed the triage vital signs and the nursing notes.  Pertinent labs & imaging results that were available during  my care of the patient were reviewed by me and considered in my medical decision making (see chart for details).  Clinical Course     Patient is a 25 year old female with history of diabetes and gastroparesis. She was recently admitted for flare up of gastroparesis and diabetic ketoacidosis. She returns here several days after discharge with complaints of pain in the center of her chest. This appears to be related to a musculoskeletal etiology as her laboratory studies and chest CT are negative. There is nothing in the workup that would suggest a cardiac etiology. Her EKG is unchanged and troponin is negative. She appears very stable and comfortable. I see no reason for admission or further workup at this time. She will be treated for musculoskeletal type chest pain/costochondritis, and advised to return as needed.  Final Clinical Impressions(s) / ED Diagnoses   Final diagnoses:  None    New Prescriptions New Prescriptions   No medications on file     Veryl Speak, MD 01/06/16 1913

## 2016-02-28 ENCOUNTER — Other Ambulatory Visit: Payer: Self-pay | Admitting: Internal Medicine

## 2016-04-04 ENCOUNTER — Other Ambulatory Visit: Payer: Self-pay | Admitting: Family Medicine

## 2016-04-04 DIAGNOSIS — I10 Essential (primary) hypertension: Secondary | ICD-10-CM

## 2016-05-03 ENCOUNTER — Other Ambulatory Visit: Payer: Self-pay | Admitting: Family Medicine

## 2016-05-03 DIAGNOSIS — I10 Essential (primary) hypertension: Secondary | ICD-10-CM

## 2016-06-04 ENCOUNTER — Other Ambulatory Visit: Payer: Self-pay | Admitting: Family Medicine

## 2016-06-04 DIAGNOSIS — I10 Essential (primary) hypertension: Secondary | ICD-10-CM

## 2016-08-16 ENCOUNTER — Emergency Department (HOSPITAL_COMMUNITY)
Admission: EM | Admit: 2016-08-16 | Discharge: 2016-08-16 | Disposition: A | Discharge status: Home / Self Care | Payer: BLUE CROSS/BLUE SHIELD | Attending: Emergency Medicine | Admitting: Emergency Medicine

## 2016-08-16 ENCOUNTER — Encounter (HOSPITAL_COMMUNITY): Payer: Self-pay | Admitting: Emergency Medicine

## 2016-08-16 DIAGNOSIS — Z87891 Personal history of nicotine dependence: Secondary | ICD-10-CM | POA: Diagnosis not present

## 2016-08-16 DIAGNOSIS — R739 Hyperglycemia, unspecified: Secondary | ICD-10-CM | POA: Insufficient documentation

## 2016-08-16 DIAGNOSIS — Z79899 Other long term (current) drug therapy: Secondary | ICD-10-CM | POA: Insufficient documentation

## 2016-08-16 DIAGNOSIS — Z794 Long term (current) use of insulin: Secondary | ICD-10-CM | POA: Insufficient documentation

## 2016-08-16 DIAGNOSIS — R42 Dizziness and giddiness: Secondary | ICD-10-CM | POA: Diagnosis present

## 2016-08-16 DIAGNOSIS — E860 Dehydration: Secondary | ICD-10-CM

## 2016-08-16 LAB — CBC
HEMATOCRIT: 33.9 % — AB (ref 36.0–46.0)
Hemoglobin: 11.5 g/dL — ABNORMAL LOW (ref 12.0–15.0)
MCH: 28.5 pg (ref 26.0–34.0)
MCHC: 33.9 g/dL (ref 30.0–36.0)
MCV: 83.9 fL (ref 78.0–100.0)
Platelets: 345 10*3/uL (ref 150–400)
RBC: 4.04 MIL/uL (ref 3.87–5.11)
RDW: 13.1 % (ref 11.5–15.5)
WBC: 9 10*3/uL (ref 4.0–10.5)

## 2016-08-16 LAB — URINALYSIS, ROUTINE W REFLEX MICROSCOPIC
BACTERIA UA: NONE SEEN
BILIRUBIN URINE: NEGATIVE
Glucose, UA: >=500 mg/dL — AB
Ketones, ur: NEGATIVE mg/dL
NITRITE: NEGATIVE
PROTEIN: NEGATIVE mg/dL
Specific Gravity, Urine: 1.027 (ref 1.005–1.030)
pH: 5 (ref 5.0–8.0)

## 2016-08-16 LAB — BASIC METABOLIC PANEL
ANION GAP: 9 (ref 5–15)
BUN: 13 mg/dL (ref 6–20)
CO2: 22 mmol/L (ref 22–32)
Calcium: 9.3 mg/dL (ref 8.9–10.3)
Chloride: 98 mmol/L — ABNORMAL LOW (ref 101–111)
Creatinine, Ser: 0.85 mg/dL (ref 0.44–1.00)
Glucose, Bld: 515 mg/dL (ref 65–99)
POTASSIUM: 3.9 mmol/L (ref 3.5–5.1)
Sodium: 129 mmol/L — ABNORMAL LOW (ref 135–145)

## 2016-08-16 LAB — CBG MONITORING, ED
GLUCOSE-CAPILLARY: 478 mg/dL — AB (ref 65–99)
GLUCOSE-CAPILLARY: 234 mg/dL — AB (ref 65–99)
Glucose-Capillary: 227 mg/dL — ABNORMAL HIGH (ref 65–99)

## 2016-08-16 MED ORDER — SODIUM CHLORIDE 0.9 % IV BOLUS (SEPSIS)
1000.0000 mL | Freq: Once | INTRAVENOUS | Status: AC
  Administered 2016-08-16: 1000 mL via INTRAVENOUS

## 2016-08-16 MED ORDER — PANTOPRAZOLE SODIUM 40 MG PO TBEC: 40.0000 mg | DELAYED_RELEASE_TABLET | Freq: Every day | ORAL | 0 refills | Status: DC

## 2016-08-16 NOTE — Discharge Instructions (Signed)
Please continue your insulin as prescribed Please follow up with your doctor Return for worsening symptoms

## 2016-08-16 NOTE — ED Notes (Signed)
Pt. BP cuff repositioned and pt. Instructed to keep arm straight to receive all the fluids.

## 2016-08-16 NOTE — ED Triage Notes (Addendum)
Pt c/o CBG 519 at home.  Missed insulin for 2 days.  Took Novolog Flex pen15 units and Lantus 15 units 10--15 min pta.  Reports feeling like room is spinning and vomited x 2 tonight.

## 2016-08-16 NOTE — ED Notes (Signed)
CBG 227; RN notified 

## 2016-08-16 NOTE — ED Notes (Signed)
CBG 234; RN aware

## 2016-08-16 NOTE — ED Provider Notes (Signed)
[facility] DEPT Provider Note   CSN: [id number] Arrival date & time: 08/16/16  0430     History   Chief Complaint Chief Complaint  Patient presents with  . Hyperglycemia    HPI Kelli Hudson is a 26 y.o. female who presents with lightheadedness/dizziness. Past medical history significant for type 2 diabetes with history of DKA. She normally takes 15 units of Lantus once daily and 5 units of NovoLog three times a day. She says she has not taken her medicine for the past 2 days because of she has been working a lot and in the process of moving therefore she missed her doses of insulin. She also hasn't had more than 2-3 hours of sleep the past couple days. Last night she went with a friend to [employer] and acutely felt dizzy, had blurry vision and saw a "kaleidoscope" and "felt high" but denies any drug use. She states that she checked her blood sugar and it was very high so she came to the emergency department. She also had 2 episodes of vomiting. No fever or recent illnesses, chest pain, shortness of breath, abdominal pain, current nausea, recurrent vomiting, diarrhea, dysuria.  HPI  Past Medical History:  Diagnosis Date  . Diabetes mellitus without complication [facility])     Patient Active Problem List   Diagnosis Date Noted  . Diabetic ketoacidosis without coma associated with type 2 diabetes mellitus ([place]) 01/02/2016  . Gastroparesis   . Leukocytosis   . Hyperglycemia   . Abdominal pain   . Diabetes mellitus due to underlying condition, uncontrolled, with hyperosmolarity without coma, without long-term current use of insulin ([place])   . THYROMEGALY 10/05/2009  . Uncontrolled diabetes mellitus ([place]) 10/05/2009  . Essential hypertension 10/05/2009    Past Surgical History:  Procedure Laterality Date  . ESOPHAGOGASTRODUODENOSCOPY (EGD) WITH PROPOFOL Left 09/08/2015   Procedure: ESOPHAGOGASTRODUODENOSCOPY (EGD) WITH PROPOFOL;  Surgeon: Arta Silence, MD;  Location[facility]  ENDOSCOPY;  Service: Endoscopy;  Laterality: Left;    OB History    No data available       Home Medications    Prior to Admission medications   Medication Sig Start Date End Date Taking? Authorizing Provider  HYDROcodone-acetaminophen (NORCO) 5-325 MG tablet Take 1-2 tablets by mouth every 6 (six) hours as needed. 01/06/16  Yes Delo, Nathaneil Canary, MD  insulin aspart (NOVOLOG FLEXPEN) 100 UNIT/ML FlexPen Inject 5 Units into the skin 3 (three) times daily with meals. 01/05/16  Yes Burgess Estelle, MD  Insulin Detemir (LEVEMIR FLEXPEN) 100 UNIT/ML Pen Inject 15 Units into the skin daily at 10 pm. 01/05/16  Yes Burgess Estelle, MD  metoCLOPramide (REGLAN) 10 MG tablet Take 1 tablet (10 mg total) by mouth 4 (four) times daily -  before meals and at bedtime. 01/04/16  Yes Holley Raring, MD  pantoprazole (PROTONIX) 40 MG tablet Take 1 tablet (40 mg total) by mouth daily. 09/09/15  Yes Reyne Dumas, MD  Blood Glucose Monitoring Suppl (TRUE METRIX METER) w/Device KIT 1 Device by Does not apply route 4 (four) times daily. 09/10/15   Brayton Caves, PA-C  glucose blood (TRUE METRIX BLOOD GLUCOSE TEST) test strip Use as instructed 09/10/15   Brayton Caves, PA-C  hydrochlorothiazide (HYDRODIURIL) 12.5 MG tablet TAKE 1 TABLET BY MOUTH DAILY 05/05/16   Arnoldo Morale, MD  Insulin Syringe-Needle U-100 (BD INSULIN SYRINGE ULTRAFINE) 31G X 15/64" 0.5 ML MISC 1 each by Does not apply route 2 (two) times daily. 09/19/15   Arnoldo Morale, MD  TRUEPLUS LANCETS 28G  [place] assist with checking blood sugar TID and qhs 09/10/15   Brayton Caves, PA-C    Family History Family History  Problem Relation Age of Onset  . Lung cancer Mother   . Bipolar disorder Father     Social History Social History  Substance Use Topics  . Smoking status: Former Research scientist (life sciences)  . Smokeless tobacco: Never Used  . Alcohol use 0.6 oz/week    1 Shots of liquor per week     Comment: occasional      Allergies   Patient has no known  allergies.   Review of Systems Review of Systems  Constitutional: Positive for fatigue. Negative for fever.  Eyes: Positive for visual disturbance.  Respiratory: Negative for shortness of breath.   Cardiovascular: Negative for chest pain.  Gastrointestinal: Negative for abdominal pain, diarrhea, nausea and vomiting.  Genitourinary: Negative for dysuria.  Neurological: Positive for dizziness and light-headedness. Negative for syncope and weakness.  All other systems reviewed and are negative.    Physical Exam Updated Vital Signs BP 120/82   Pulse 96   Temp 97.6 F (36.4 C) (Oral)   Resp 16   LMP 08/02/2016   SpO2 99%   Physical Exam  Constitutional: She is oriented to person, place, and time. She appears well-developed and well-nourished. No distress.  HENT:  Head: Normocephalic and atraumatic.  Eyes: Conjunctivae are normal. Pupils are equal, round, and reactive to light. Right eye exhibits no discharge. Left eye exhibits no discharge. No scleral icterus.  Neck: Normal range of motion.  Cardiovascular: Tachycardia present.  Exam reveals no gallop and no friction rub.   No murmur heard. Pulmonary/Chest: Effort normal and breath sounds normal. No respiratory distress. She has no wheezes. She has no rales. She exhibits no tenderness.  Abdominal: Soft. Bowel sounds are normal. She exhibits no distension and no mass. There is no tenderness. There is no rebound and no guarding. No hernia.  Neurological: She is alert and oriented to person, place, and time.  Skin: Skin is warm and dry.  Psychiatric: She has a normal mood and affect. Her behavior is normal.  Nursing note and vitals reviewed.    ED Treatments / Results  Labs (all labs ordered are listed, but only abnormal results are displayed) Labs Reviewed  BASIC METABOLIC PANEL - Abnormal; Notable for the following:       Result Value   Sodium 129 (*)    Chloride 98 (*)    Glucose, Bld 515 (*)    All other components  within normal limits  CBC - Abnormal; Notable for the following:    Hemoglobin 11.5 (*)    HCT 33.9 (*)    All other components within normal limits  URINALYSIS, ROUTINE W REFLEX MICROSCOPIC - Abnormal; Notable for the following:    Color, Urine STRAW (*)    Glucose, UA >=500 (*)    Hgb urine dipstick SMALL (*)    Leukocytes, UA TRACE (*)    Squamous Epithelial / LPF 0-5 (*)    All other components within normal limits  CBG MONITORING, ED - Abnormal; Notable for the following:    Glucose-Capillary 478 (*)    All other components within normal limits  CBG MONITORING, ED - Abnormal; Notable for the following:    Glucose-Capillary 234 (*)    All other components within normal limits  CBG MONITORING, ED - Abnormal; Notable for the following:    Glucose-Capillary 227 (*)    All other components within normal limits  EKG  EKG Interpretation None       Radiology No results found.  Procedures Procedures (including critical care time)  Medications Ordered in ED Medications  sodium chloride 0.9 % bolus 1,000 mL (0 mLs Intravenous Stopped 08/16/16 1125)  sodium chloride 0.9 % bolus 1,000 mL (0 mLs Intravenous Stopped 08/16/16 1226)     Initial Impression / Assessment and Plan / ED Course  I have reviewed the triage vital signs and the nursing notes.  Pertinent labs & imaging results that were available during my care of the patient were reviewed by me and considered in my medical decision making (see chart for details).  26 year old female with an episode of lightheadedness and nausea and vomiting with marked hyperglycemia. No evidence of DKA. Most likely symptoms are due to dehydration. She is mildly tachycardic and hypertensive at times. Otherwise vital signs are normal. Exam is unremarkable. Abdomen is nontender. CBC remarkable for mild anemia. BMP remarkable for hyponatremia, hypochloremia, and glucose of 515. Anion gap and bicarbonate are normal. Urine shows >500 glucose and  ketones. We'll start fluids and reassess. Currently patient is asymptomatic.  After 2 L of normal saline CBG is 227. Patient feels better and is ready for discharge. She is asking for Protonix refill. Will give 30 day supply and advised her to follow up with [facility] and wellness for any additional medicine refills. Return precautions were given.  Final Clinical Impressions(s) / ED Diagnoses   Final diagnoses:  Hyperglycemia  Dehydration    New Prescriptions New Prescriptions   No medications on file     Iris Pert 08/16/16 1244    Little, Wenda Overland, MD 08/17/16 1128

## 2016-08-22 ENCOUNTER — Emergency Department (HOSPITAL_COMMUNITY): Payer: BLUE CROSS/BLUE SHIELD

## 2016-08-22 ENCOUNTER — Encounter (HOSPITAL_COMMUNITY): Payer: Self-pay | Admitting: Emergency Medicine

## 2016-08-22 ENCOUNTER — Emergency Department (HOSPITAL_COMMUNITY)
Admission: EM | Admit: 2016-08-22 | Discharge: 2016-08-22 | Disposition: A | Discharge status: Home / Self Care | Payer: BLUE CROSS/BLUE SHIELD | Attending: Emergency Medicine | Admitting: Emergency Medicine

## 2016-08-22 DIAGNOSIS — R079 Chest pain, unspecified: Secondary | ICD-10-CM | POA: Diagnosis present

## 2016-08-22 DIAGNOSIS — E119 Type 2 diabetes mellitus without complications: Secondary | ICD-10-CM | POA: Diagnosis not present

## 2016-08-22 DIAGNOSIS — I10 Essential (primary) hypertension: Secondary | ICD-10-CM | POA: Insufficient documentation

## 2016-08-22 DIAGNOSIS — R0789 Other chest pain: Secondary | ICD-10-CM | POA: Diagnosis not present

## 2016-08-22 DIAGNOSIS — Z87891 Personal history of nicotine dependence: Secondary | ICD-10-CM | POA: Diagnosis not present

## 2016-08-22 DIAGNOSIS — Z794 Long term (current) use of insulin: Secondary | ICD-10-CM | POA: Diagnosis not present

## 2016-08-22 HISTORY — DX: Essential (primary) hypertension: I10

## 2016-08-22 LAB — CBG MONITORING, ED
GLUCOSE-CAPILLARY: 382 mg/dL — AB (ref 65–99)
Glucose-Capillary: 307 mg/dL — ABNORMAL HIGH (ref 65–99)

## 2016-08-22 LAB — BASIC METABOLIC PANEL
ANION GAP: 9 (ref 5–15)
BUN: 12 mg/dL (ref 6–20)
CO2: 22 mmol/L (ref 22–32)
Calcium: 8.7 mg/dL — ABNORMAL LOW (ref 8.9–10.3)
Chloride: 102 mmol/L (ref 101–111)
Creatinine, Ser: 0.83 mg/dL (ref 0.44–1.00)
GFR calc Af Amer: >60 mL/min (ref >60)
Glucose, Bld: 391 mg/dL — ABNORMAL HIGH (ref 65–99)
POTASSIUM: 4.1 mmol/L (ref 3.5–5.1)
SODIUM: 133 mmol/L — AB (ref 135–145)

## 2016-08-22 LAB — I-STAT TROPONIN, ED: Troponin i, poc: 0 ng/mL (ref 0.00–0.08)

## 2016-08-22 LAB — CBC
HEMATOCRIT: 34.3 % — AB (ref 36.0–46.0)
Hemoglobin: 11.5 g/dL — ABNORMAL LOW (ref 12.0–15.0)
MCH: 28.8 pg (ref 26.0–34.0)
MCHC: 33.5 g/dL (ref 30.0–36.0)
MCV: 86 fL (ref 78.0–100.0)
Platelets: 334 10*3/uL (ref 150–400)
RBC: 3.99 MIL/uL (ref 3.87–5.11)
RDW: 13 % (ref 11.5–15.5)
WBC: 9.3 10*3/uL (ref 4.0–10.5)

## 2016-08-22 MED ORDER — SODIUM CHLORIDE 0.9 % IV BOLUS (SEPSIS)
1000.0000 mL | Freq: Once | INTRAVENOUS | Status: AC
  Administered 2016-08-22: 1000 mL via INTRAVENOUS

## 2016-08-22 MED ORDER — NAPROXEN 500 MG PO TABS: 500.0000 mg | ORAL_TABLET | Freq: Two times a day (BID) | ORAL | 0 refills | Status: DC

## 2016-08-22 MED ORDER — ACETAMINOPHEN 500 MG PO TABS
1000.0000 mg | ORAL_TABLET | Freq: Once | ORAL | Status: AC
  Administered 2016-08-22: 1000 mg via ORAL
  Filled 2016-08-22: qty 2

## 2016-08-22 MED ORDER — IOPAMIDOL (ISOVUE-370) INJECTION 76%
INTRAVENOUS | Status: AC
  Administered 2016-08-22: 100 mL
  Filled 2016-08-22: qty 100

## 2016-08-22 MED ORDER — KETOROLAC TROMETHAMINE 30 MG/ML IJ SOLN
30.0000 mg | Freq: Once | INTRAMUSCULAR | Status: AC
  Administered 2016-08-22: 30 mg via INTRAVENOUS
  Filled 2016-08-22: qty 1

## 2016-08-22 NOTE — ED Triage Notes (Signed)
Pt to ED from work c/o sudden onset burning central, non-radiating chest pain x 1 hour. Pt reports dizziness and appears uncomfortable in triage. She states she's never had this burning pain before. Denies SOB/fevers/chills. Resp e/u, skin warm/dry.

## 2016-08-22 NOTE — ED Provider Notes (Signed)
[facility] DEPT Provider Note   CSN: [id number] Arrival date & time: 08/22/16  0023  By signing my name below, I, Ny'Kea Lewis, attest that this documentation has been prepared under the direction and in the presence of Granite Godman, Gwenyth Allegra, *. Electronically Signed: Lise Auer, ED Scribe. 08/22/16. 2:43 AM.  History   Chief Complaint Chief Complaint  Patient presents with  . Chest Pain   The history is provided by the patient. No language interpreter was used.    HPI Comments: Latash Nouri is a 26 y.o. female with a history of DM II and hypertension, who presents to the Emergency Department complaining of sudden onset, mid-sternum chest pain that began prior to arrival. Pt notes associated dizziness, and chest tightness. She reports that when the pain began she was working, she works in an office setting. No treatment tried PTA. Denies shortness of breath, cough, runny nose, sore throat, nausea, abdominal pain, emesis, or diarrhea.   Past Medical History:  Diagnosis Date  . Diabetes mellitus without complication ([place])   . Hypertension     Patient Active Problem List   Diagnosis Date Noted  . Diabetic ketoacidosis without coma associated with type 2 diabetes mellitus ([place]) 01/02/2016  . Gastroparesis   . Leukocytosis   . Hyperglycemia   . Abdominal pain   . Diabetes mellitus due to underlying condition, uncontrolled, with hyperosmolarity without coma, without long-term current use of insulin ([place])   . THYROMEGALY 10/05/2009  . Uncontrolled diabetes mellitus ([place]) 10/05/2009  . Essential hypertension 10/05/2009    Past Surgical History:  Procedure Laterality Date  . ESOPHAGOGASTRODUODENOSCOPY (EGD) WITH PROPOFOL Left 09/08/2015   Procedure: ESOPHAGOGASTRODUODENOSCOPY (EGD) WITH PROPOFOL;  Surgeon: Arta Silence, MD;  Location: [facility] ENDOSCOPY;  Service: Endoscopy;  Laterality: Left;    OB History    No data available     Home Medications    Prior to  Admission medications   Medication Sig Start Date End Date Taking? Authorizing Provider  Blood Glucose Monitoring Suppl (TRUE METRIX METER) w/Device KIT 1 Device by Does not apply route 4 (four) times daily. 09/10/15  Yes Ena Dawley, Tiffany S, PA-C  glucose blood (TRUE METRIX BLOOD GLUCOSE TEST) test strip Use as instructed 09/10/15  Yes Ena Dawley, Tiffany S, PA-C  HYDROcodone-acetaminophen (NORCO) 5-325 MG tablet Take 1-2 tablets by mouth every 6 (six) hours as needed. Patient taking differently: Take 1-2 tablets by mouth every 6 (six) hours as needed for moderate pain.  01/06/16  Yes Delo, Nathaneil Canary, MD  insulin aspart (NOVOLOG FLEXPEN) 100 UNIT/ML FlexPen Inject 5 Units into the skin 3 (three) times daily with meals. 01/05/16  Yes Burgess Estelle, MD  Insulin Detemir (LEVEMIR FLEXPEN) 100 UNIT/ML Pen Inject 15 Units into the skin daily at 10 pm. 01/05/16  Yes Burgess Estelle, MD  Insulin Syringe-Needle U-100 (BD INSULIN SYRINGE ULTRAFINE) 31G X 15/64" 0.5 ML MISC 1 each by Does not apply route 2 (two) times daily. 09/19/15  Yes Arnoldo Morale, MD  metoCLOPramide (REGLAN) 10 MG tablet Take 1 tablet (10 mg total) by mouth 4 (four) times daily -  before meals and at bedtime. Patient taking differently: Take 10 mg by mouth 3 (three) times daily as needed for nausea or vomiting.  01/04/16  Yes Holley Raring, MD  pantoprazole (PROTONIX) 40 MG tablet Take 1 tablet (40 mg total) by mouth daily. Patient taking differently: Take 40 mg by mouth daily as needed (heart burn).  08/16/16  Yes Recardo Evangelist, PA-C  Ursina assist  with checking blood sugar TID and qhs 09/10/15  Yes Noel, Tiffany S, PA-C  naproxen (NAPROSYN) 500 MG tablet Take 1 tablet (500 mg total) by mouth 2 (two) times daily. 08/22/16   Orpah Greek, MD   Family History Family History  Problem Relation Age of Onset  . Lung cancer Mother   . Bipolar disorder Father    Social History Social History  Substance Use Topics  .  Smoking status: Former Research scientist (life sciences)  . Smokeless tobacco: Never Used  . Alcohol use 0.6 oz/week    1 Shots of liquor per week     Comment: occasional    Allergies   Patient has no known allergies.  Review of Systems Review of Systems  HENT: Negative for rhinorrhea and sore throat.   Respiratory: Positive for chest tightness. Negative for cough and shortness of breath.   Cardiovascular: Positive for chest pain.  Gastrointestinal: Negative for diarrhea.  Neurological: Positive for dizziness.  All other systems reviewed and are negative.  Physical Exam Updated Vital Signs BP 111/78   Pulse (!) 101   Temp 98.4 F (36.9 C) (Oral)   Resp 16   LMP 08/22/2016 (Exact Date)   SpO2 100%   Physical Exam  Constitutional: She is oriented to person, place, and time. She appears well-developed and well-nourished. No distress.  HENT:  Head: Normocephalic and atraumatic.  Right Ear: Hearing normal.  Left Ear: Hearing normal.  Nose: Nose normal.  Mouth/Throat: Oropharynx is clear and moist and mucous membranes are normal.  Eyes: Conjunctivae and EOM are normal. Pupils are equal, round, and reactive to light.  Neck: Normal range of motion. Neck supple.  Cardiovascular: Regular rhythm, S1 normal and S2 normal.  Tachycardia present.  Exam reveals no gallop and no friction rub.   No murmur heard. Pulmonary/Chest: Effort normal and breath sounds normal. No respiratory distress. She exhibits tenderness.  Significant chest wall tenderness.  Abdominal: Soft. Normal appearance and bowel sounds are normal. There is no hepatosplenomegaly. There is no tenderness. There is no rebound, no guarding, no tenderness at McBurney's point and negative Murphy's sign. No hernia.  Musculoskeletal: Normal range of motion.  Neurological: She is alert and oriented to person, place, and time. She has normal strength. No cranial nerve deficit or sensory deficit. Coordination normal. GCS eye subscore is 4. GCS verbal subscore  is 5. GCS motor subscore is 6.  Skin: Skin is warm, dry and intact. No rash noted. No cyanosis.  Psychiatric: She has a normal mood and affect. Her speech is normal and behavior is normal. Thought content normal.  Nursing note and vitals reviewed.  ED Treatments / Results  DIAGNOSTIC STUDIES: Oxygen Saturation is 99% on RA, normal by my interpretation.   COORDINATION OF CARE: 2:37 AM-Discussed next steps with pt. Pt verbalized understanding and is agreeable with the plan.   Labs (all labs ordered are listed, but only abnormal results are displayed) Labs Reviewed  BASIC METABOLIC PANEL - Abnormal; Notable for the following:       Result Value   Sodium 133 (*)    Glucose, Bld 391 (*)    Calcium 8.7 (*)    All other components within normal limits  CBC - Abnormal; Notable for the following:    Hemoglobin 11.5 (*)    HCT 34.3 (*)    All other components within normal limits  CBG MONITORING, ED - Abnormal; Notable for the following:    Glucose-Capillary 382 (*)    All other components within  normal limits  I-STAT TROPOININ, ED    EKG  EKG Interpretation  Date/Time:  Friday August 22 2016 00:29:31 EDT Ventricular Rate:  116 PR Interval:  138 QRS Duration: 88 QT Interval:  298 QTC Calculation: 414 R Axis:   69 Text Interpretation:  Sinus tachycardia Otherwise normal ECG Confirmed by Orpah Greek [phone]) on 08/22/2016 2:36:48 AM       Radiology Dg Chest 2 View  Result Date: 08/22/2016 CLINICAL DATA:  Chest pain EXAM: CHEST  2 VIEW COMPARISON:  Chest CT 01/06/2016 FINDINGS: The heart size and mediastinal contours are within normal limits. Both lungs are clear. The visualized skeletal structures are unremarkable. IMPRESSION: No active cardiopulmonary disease. Electronically Signed   By: Ulyses Jarred M.D.   On: 08/22/2016 01:46   Ct Angio Chest Pe W Or Wo Contrast  Result Date: 08/22/2016 CLINICAL DATA:  Acute onset of centralized chest pain. Initial encounter. EXAM:  CT ANGIOGRAPHY CHEST WITH CONTRAST TECHNIQUE: Multidetector CT imaging of the chest was performed using the standard protocol during bolus administration of intravenous contrast. Multiplanar CT image reconstructions and MIPs were obtained to evaluate the vascular anatomy. CONTRAST:  48 mL of Isovue 370 IV contrast COMPARISON:  CTA of the chest performed 01/06/2016 FINDINGS: Cardiovascular:  There is no evidence of pulmonary embolus. The heart is normal in size. The thoracic aorta is unremarkable. The great vessels are within normal limits. Mediastinum/Nodes: The mediastinum is unremarkable in appearance. No mediastinal lymphadenopathy is seen. No pericardial effusion is identified. Residual thymic tissue is within normal limits. The visualized portions of the thyroid gland are unremarkable. No axillary lymphadenopathy is seen. Lungs/Pleura: The lungs are clear bilaterally. No focal consolidation, pleural effusion or pneumothorax is seen. No masses are identified. Upper Abdomen: The visualized portions of the liver and spleen are unremarkable. The visualized portions of the gallbladder, pancreas, adrenal glands and kidneys are within normal limits. Musculoskeletal: No acute osseous abnormalities are identified. The visualized musculature is unremarkable in appearance. Review of the MIP images confirms the above findings. IMPRESSION: 1. No evidence of pulmonary embolus. 2. Lungs clear bilaterally. Electronically Signed   By: Garald Balding M.D.   On: 08/22/2016 06:03    Procedures Procedures (including critical care time)  Medications Ordered in ED Medications  sodium chloride 0.9 % bolus 1,000 mL (0 mLs Intravenous Stopped 08/22/16 0438)  ketorolac (TORADOL) 30 MG/ML injection 30 mg (30 mg Intravenous Given 08/22/16 0300)  acetaminophen (TYLENOL) tablet 1,000 mg (1,000 mg Oral Given 08/22/16 0259)  iopamidol (ISOVUE-370) 76 % injection (100 mLs  Contrast Given 08/22/16 0528)     Initial Impression / Assessment  and Plan / ED Course  I have reviewed the triage vital signs and the nursing notes.  Pertinent labs & imaging results that were available during my care of the patient were reviewed by me and considered in my medical decision making (see chart for details).     Patient presents to the ER for evaluation of chest pain. Patient reports onset of pain while at work. She was doing paperwork, nothing strenuous. Patient complaining of central pain has been continuous in nature. She does have a history of diabetes. No nausea, vomiting or fever. She was noted to be tachycardic. Patient appears to be somewhat anxious. Because of her tachycardia and chest pain, could not rule out PE clinically. CT angiography therefore performed. No evidence of PE or other abnormality noted. She did have tenderness to palpation of the chest wall. Will be treated for chest  wall pain, follow-up with primary care.  Final Clinical Impressions(s) / ED Diagnoses   Final diagnoses:  Chest pain  Chest wall pain    New Prescriptions New Prescriptions   NAPROXEN (NAPROSYN) 500 MG TABLET    Take 1 tablet (500 mg total) by mouth 2 (two) times daily.  I personally performed the services described in this documentation, which was scribed in my presence. The recorded information has been reviewed and is accurate.     Orpah Greek, MD 08/22/16 [phone]

## 2016-08-22 NOTE — ED Notes (Signed)
Pt reports taking 10 units of novolog in the waiting room around 0100.

## 2016-09-17 ENCOUNTER — Ambulatory Visit: Payer: BLUE CROSS/BLUE SHIELD | Admitting: Family Medicine

## 2017-09-24 ENCOUNTER — Ambulatory Visit (INDEPENDENT_AMBULATORY_CARE_PROVIDER_SITE_OTHER): Payer: BLUE CROSS/BLUE SHIELD | Admitting: Orthopedic Surgery

## 2017-09-24 ENCOUNTER — Ambulatory Visit (INDEPENDENT_AMBULATORY_CARE_PROVIDER_SITE_OTHER): Payer: Self-pay

## 2017-09-24 ENCOUNTER — Encounter (INDEPENDENT_AMBULATORY_CARE_PROVIDER_SITE_OTHER): Payer: Self-pay | Admitting: Orthopedic Surgery

## 2017-09-24 VITALS — Ht 67.0 in | Wt 186.0 lb

## 2017-09-24 DIAGNOSIS — E1042 Type 1 diabetes mellitus with diabetic polyneuropathy: Secondary | ICD-10-CM | POA: Insufficient documentation

## 2017-09-24 DIAGNOSIS — E1142 Type 2 diabetes mellitus with diabetic polyneuropathy: Secondary | ICD-10-CM

## 2017-09-24 DIAGNOSIS — L97411 Non-pressure chronic ulcer of right heel and midfoot limited to breakdown of skin: Secondary | ICD-10-CM | POA: Diagnosis not present

## 2017-09-24 DIAGNOSIS — M79671 Pain in right foot: Secondary | ICD-10-CM | POA: Diagnosis not present

## 2017-09-24 DIAGNOSIS — L97522 Non-pressure chronic ulcer of other part of left foot with fat layer exposed: Secondary | ICD-10-CM | POA: Insufficient documentation

## 2017-09-24 NOTE — Progress Notes (Signed)
Office Visit Note   Patient: Kelli Hudson           Date of Birth: December 04, 1990           MRN: [id number] Visit Date: 09/24/2017              Requested by: Hoy RegisterNewlin, Enobong, MD [address] [place], [place] [id number] PCP: Hoy RegisterNewlin, Enobong, MD  Chief Complaint  Patient presents with  . Right Foot - Pain      HPI: Patient is a 27 year old woman with diabetic neuropathy is seen for initial evaluation for a Lorayne MarekWagener grade 1 ulcer beneath the third and fourth metatarsal heads right foot.  Patient initially went to urgent care she was started on Keflex and presents at this time for initial evaluation.  Patient is wearing flip-flops without any protective dressing.  Assessment & Plan: Visit Diagnoses:  1. Pain in right foot   2. Diabetic polyneuropathy associated with type 2 diabetes mellitus ([facility])   3. Midfoot skin ulcer, right, limited to breakdown of skin ([facility])     Plan: The ulcer was debrided of skin and soft tissue she will use a Band-Aid and antibiotic ointment.  Recommended a stiff soled walking sneaker recommended better control of her diabetes and reevaluate in 4 weeks may need to up apply a felt relieving donut within her walking sneaker.  Follow-Up Instructions: Return in about 4 weeks (around 10/22/2017).   Ortho Exam  Patient is alert, oriented, no adenopathy, well-dressed, normal affect, normal respiratory effort. Examination patient has a normal gait she has a good dorsalis pedis pulse.  Patient has a hemoglobin A1c that was 14.  There is no redness no cellulitis no odor no drainage no signs of infection.  After informed consent a 10 blade knife was used to debride the skin and soft tissue back to healthy viable granulation tissue the ulcer is 10 mm in diameter 1 mm deep this was touched with silver nitrate this does not probe to bone or tendon.  Bactroban and a Band-Aid was applied.  Imaging: Xr Foot Complete Right  Result Date: 09/24/2017 3 view radiographs of  the right foot shows soft tissue defect plantarly.  There are no clinical signs of osteomyelitis across the forefoot.  Patient does have a long second third and fourth metatarsal.  No images are attached to the encounter.  Labs: Lab Results  Component Value Date   HGBA1C 9.7 (H) 01/02/2016   HGBA1C 14.0 (H) 09/02/2015   HGBA1C 13.7 (H) 10/05/2009     Lab Results  Component Value Date   ALBUMIN 4.5 01/02/2016   ALBUMIN 3.3 (L) 09/08/2015   ALBUMIN 3.4 (L) 09/07/2015    Body mass index is 29.13 kg/m.  Orders:  Orders Placed This Encounter  Procedures  . XR Foot Complete Right   No orders of the defined types were placed in this encounter.    Procedures: No procedures performed  Clinical Data: No additional findings.  ROS:  All other systems negative, except as noted in the HPI. Review of Systems  Objective: Vital Signs: Ht 5\' 7"  (1.702 m)   Wt 186 lb (84.4 kg)   BMI 29.13 kg/m   Specialty Comments:  No specialty comments available.  PMFS History: Patient Active Problem List   Diagnosis Date Noted  . Midfoot skin ulcer, right, limited to breakdown of skin ([facility]) 09/24/2017  . Diabetic polyneuropathy associated with type 2 diabetes mellitus ([facility]) 09/24/2017  . Pain in right foot 09/24/2017  . Diabetic  ketoacidosis without coma associated with type 2 diabetes mellitus ([facility]) 01/02/2016  . Gastroparesis   . Leukocytosis   . Hyperglycemia   . Abdominal pain   . Diabetes mellitus due to underlying condition, uncontrolled, with hyperosmolarity without coma, without long-term current use of insulin ([facility])   . THYROMEGALY 10/05/2009  . Uncontrolled diabetes mellitus ([facility]) 10/05/2009  . Essential hypertension 10/05/2009   Past Medical History:  Diagnosis Date  . Diabetes mellitus without complication ([facility])   . Hypertension     Family History  Problem Relation Age of Onset  . Lung cancer Mother   . Bipolar disorder Father     Past Surgical History:    Procedure Laterality Date  . ESOPHAGOGASTRODUODENOSCOPY (EGD) WITH PROPOFOL Left 09/08/2015   Procedure: ESOPHAGOGASTRODUODENOSCOPY (EGD) WITH PROPOFOL;  Surgeon: Willis Modena, MD;  Location: [facility] ENDOSCOPY;  Service: Endoscopy;  Laterality: Left;   Social History   Occupational History  . Not on file  Tobacco Use  . Smoking status: Former Games developer  . Smokeless tobacco: Never Used  Substance and Sexual Activity  . Alcohol use: Yes    Alcohol/week: 1.0 standard drinks    Types: 1 Shots of liquor per week    Comment: occasional   . Drug use: No  . Sexual activity: Not on file

## 2017-09-26 ENCOUNTER — Other Ambulatory Visit: Payer: Self-pay

## 2017-09-26 ENCOUNTER — Emergency Department (HOSPITAL_COMMUNITY): Payer: BLUE CROSS/BLUE SHIELD

## 2017-09-26 ENCOUNTER — Emergency Department (HOSPITAL_COMMUNITY)
Admission: EM | Admit: 2017-09-26 | Discharge: 2017-09-26 | Disposition: A | Discharge status: Home / Self Care | Payer: BLUE CROSS/BLUE SHIELD | Attending: Emergency Medicine | Admitting: Emergency Medicine

## 2017-09-26 ENCOUNTER — Encounter (HOSPITAL_COMMUNITY): Payer: Self-pay | Admitting: Emergency Medicine

## 2017-09-26 DIAGNOSIS — Z87891 Personal history of nicotine dependence: Secondary | ICD-10-CM | POA: Insufficient documentation

## 2017-09-26 DIAGNOSIS — Z794 Long term (current) use of insulin: Secondary | ICD-10-CM | POA: Insufficient documentation

## 2017-09-26 DIAGNOSIS — I10 Essential (primary) hypertension: Secondary | ICD-10-CM | POA: Diagnosis not present

## 2017-09-26 DIAGNOSIS — Z79899 Other long term (current) drug therapy: Secondary | ICD-10-CM | POA: Insufficient documentation

## 2017-09-26 DIAGNOSIS — Y929 Unspecified place or not applicable: Secondary | ICD-10-CM | POA: Diagnosis not present

## 2017-09-26 DIAGNOSIS — Y999 Unspecified external cause status: Secondary | ICD-10-CM | POA: Insufficient documentation

## 2017-09-26 DIAGNOSIS — R739 Hyperglycemia, unspecified: Secondary | ICD-10-CM

## 2017-09-26 DIAGNOSIS — X58XXXA Exposure to other specified factors, initial encounter: Secondary | ICD-10-CM | POA: Insufficient documentation

## 2017-09-26 DIAGNOSIS — S91301A Unspecified open wound, right foot, initial encounter: Secondary | ICD-10-CM | POA: Diagnosis present

## 2017-09-26 DIAGNOSIS — Y939 Activity, unspecified: Secondary | ICD-10-CM | POA: Insufficient documentation

## 2017-09-26 DIAGNOSIS — E1165 Type 2 diabetes mellitus with hyperglycemia: Secondary | ICD-10-CM | POA: Diagnosis not present

## 2017-09-26 LAB — BASIC METABOLIC PANEL
Anion gap: 10 (ref 5–15)
BUN: 18 mg/dL (ref 6–20)
CALCIUM: 9 mg/dL (ref 8.9–10.3)
CHLORIDE: 101 mmol/L (ref 98–111)
CO2: 23 mmol/L (ref 22–32)
CREATININE: 0.89 mg/dL (ref 0.44–1.00)
Glucose, Bld: 411 mg/dL — ABNORMAL HIGH (ref 70–99)
Potassium: 4.6 mmol/L (ref 3.5–5.1)
SODIUM: 134 mmol/L — AB (ref 135–145)

## 2017-09-26 LAB — CBC WITH DIFFERENTIAL/PLATELET
BASOS ABS: 0 10*3/uL (ref 0.0–0.1)
Basophils Relative: 0 %
EOS ABS: 0.1 10*3/uL (ref 0.0–0.7)
EOS PCT: 2 %
HCT: 33.7 % — ABNORMAL LOW (ref 36.0–46.0)
HEMOGLOBIN: 11.8 g/dL — AB (ref 12.0–15.0)
LYMPHS ABS: 3.5 10*3/uL (ref 0.7–4.0)
Lymphocytes Relative: 44 %
MCH: 29.3 pg (ref 26.0–34.0)
MCHC: 35 g/dL (ref 30.0–36.0)
MCV: 83.6 fL (ref 78.0–100.0)
Monocytes Absolute: 0.5 10*3/uL (ref 0.1–1.0)
Monocytes Relative: 6 %
NEUTROS PCT: 48 %
Neutro Abs: 3.9 10*3/uL (ref 1.7–7.7)
PLATELETS: 400 10*3/uL (ref 150–400)
RBC: 4.03 MIL/uL (ref 3.87–5.11)
RDW: 12.5 % (ref 11.5–15.5)
WBC: 8.1 10*3/uL (ref 4.0–10.5)

## 2017-09-26 LAB — CBG MONITORING, ED: Glucose-Capillary: 388 mg/dL — ABNORMAL HIGH (ref 70–99)

## 2017-09-26 LAB — SEDIMENTATION RATE: SED RATE: 45 mm/h — AB (ref 0–22)

## 2017-09-26 LAB — POC URINE PREG, ED: Preg Test, Ur: NEGATIVE

## 2017-09-26 MED ORDER — SODIUM CHLORIDE 0.9 % IV BOLUS
1000.0000 mL | Freq: Once | INTRAVENOUS | Status: AC
  Administered 2017-09-26: 1000 mL via INTRAVENOUS

## 2017-09-26 NOTE — ED Triage Notes (Signed)
Patient c/o worsening wound to right foot x2 weeks. Reports follow up with wound care specialist. States after work today she noticed wound was worse. Hx diabetes.

## 2017-09-26 NOTE — ED Provider Notes (Signed)
[facility] DEPT Provider Note   CSN: [id number] Arrival date & time: 09/26/17  2040     History   Chief Complaint Chief Complaint  Patient presents with  . Wound Check    HPI Kelli Hudson is a 27 y.o. female.  HPI 27 year old female with a history of diabetes presents with a right foot wound.  Wound has been present for a couple weeks and she saw Dr. Sharol Given 2 days ago.  He debrided the wound and she states is been sore and painful since.  She is also noticed a brown skin color change that is not painful.  She is not sure if she injured it.  However the source seems to be a little more painful.  It drains a small amount on the Band-Aid she has and it is usually yellow.  No erythema.  No fevers.  Past Medical History:  Diagnosis Date  . Diabetes mellitus without complication ([place])   . Hypertension     Patient Active Problem List   Diagnosis Date Noted  . Midfoot skin ulcer, right, limited to breakdown of skin ([place]) 09/24/2017  . Diabetic polyneuropathy associated with type 2 diabetes mellitus ([place]) 09/24/2017  . Pain in right foot 09/24/2017  . Diabetic ketoacidosis without coma associated with type 2 diabetes mellitus ([place]) 01/02/2016  . Gastroparesis   . Leukocytosis   . Hyperglycemia   . Abdominal pain   . Diabetes mellitus due to underlying condition, uncontrolled, with hyperosmolarity without coma, without long-term current use of insulin ([place])   . THYROMEGALY 10/05/2009  . Uncontrolled diabetes mellitus ([place]) 10/05/2009  . Essential hypertension 10/05/2009    Past Surgical History:  Procedure Laterality Date  . ESOPHAGOGASTRODUODENOSCOPY (EGD) WITH PROPOFOL Left 09/08/2015   Procedure: ESOPHAGOGASTRODUODENOSCOPY (EGD) WITH PROPOFOL;  Surgeon: Arta Silence, MD;  Location: [facility] ENDOSCOPY;  Service: Endoscopy;  Laterality: Left;     OB History   None      Home Medications    Prior to Admission medications   Medication  Sig Start Date End Date Taking? Authorizing Provider  cephALEXin (KEFLEX) 500 MG capsule Take 500 mg by mouth 4 (four) times daily.  09/14/17  Yes [provider]  ibuprofen (ADVIL,MOTRIN) 200 MG tablet Take 400-600 mg by mouth every 6 (six) hours as needed for moderate pain.   Yes [provider]  insulin aspart (NOVOLOG FLEXPEN) 100 UNIT/ML FlexPen Inject 5 Units into the skin 3 (three) times daily with meals. Patient taking differently: Inject 10-15 Units into the skin 3 (three) times daily with meals. Sliding scale 01/05/16  Yes Burgess Estelle, MD  Insulin Glargine (LANTUS SOLOSTAR) 100 UNIT/ML Solostar Pen Inject 15 Units into the skin at bedtime.  07/10/17  Yes [provider]  metFORMIN (GLUCOPHAGE-XR) 500 MG 24 hr tablet Take 500 mg by mouth 2 (two) times daily.  09/05/17  Yes [provider]  Multiple Vitamins-Minerals (HAIR SKIN AND NAILS FORMULA PO) Take 3 tablets by mouth daily.   Yes [provider]  Blood Glucose Monitoring Suppl (TRUE METRIX METER) w/Device KIT 1 Device by Does not apply route 4 (four) times daily. 09/10/15   Brayton Caves, PA-C  glucose blood (TRUE METRIX BLOOD GLUCOSE TEST) test strip Use as instructed 09/10/15   Brayton Caves, PA-C  HYDROcodone-acetaminophen (NORCO) 5-325 MG tablet Take 1-2 tablets by mouth every 6 (six) hours as needed. Patient not taking: Reported on 09/24/2017 01/06/16   Veryl Speak, MD  Insulin Detemir (LEVEMIR FLEXPEN) 100 UNIT/ML  Pen Inject 15 Units into the skin daily at 10 pm. Patient not taking: Reported on 09/24/2017 01/05/16   Burgess Estelle, MD  Insulin Syringe-Needle U-100 (BD INSULIN SYRINGE ULTRAFINE) 31G X 15/64" 0.5 ML MISC 1 each by Does not apply route 2 (two) times daily. 09/19/15   Charlott Rakes, MD  metoCLOPramide (REGLAN) 10 MG tablet Take 1 tablet (10 mg total) by mouth 4 (four) times daily -  before meals and at bedtime. Patient not taking: Reported on 09/24/2017 01/04/16   Holley Raring, MD  naproxen (NAPROSYN) 500 MG tablet Take 1 tablet (500 mg total) by mouth 2 (two) times daily. Patient not taking: Reported on 09/26/2017 08/22/16   Orpah Greek, MD  pantoprazole (PROTONIX) 40 MG tablet Take 1 tablet (40 mg total) by mouth daily. Patient not taking: Reported on 09/26/2017 08/16/16   Recardo Evangelist, PA-C  TRUEPLUS LANCETS 28G MISC assist with checking blood sugar TID and qhs 09/10/15   Brayton Caves, PA-C    Family History Family History  Problem Relation Age of Onset  . Lung cancer Mother   . Bipolar disorder Father     Social History Social History   Tobacco Use  . Smoking status: Former Research scientist (life sciences)  . Smokeless tobacco: Never Used  Substance Use Topics  . Alcohol use: Yes    Alcohol/week: 1.0 standard drinks    Types: 1 Shots of liquor per week    Comment: occasional   . Drug use: No     Allergies   Patient has no known allergies.   Review of Systems Review of Systems  Constitutional: Negative for fever.  Musculoskeletal: Positive for arthralgias.  Skin: Positive for color change and wound.     Physical Exam Updated Vital Signs BP (!) 144/100 (BP Location: Left Arm)   Pulse (!) 101   Temp 97.9 F (36.6 C) (Oral)   Resp 18   LMP 09/05/2017   SpO2 100%   Physical Exam  Constitutional: She appears well-developed and well-nourished.  HENT:  Head: Normocephalic and atraumatic.  Nose: Nose normal.  Eyes: Right eye exhibits no discharge. Left eye exhibits no discharge.  Cardiovascular: Normal rate and regular rhythm.  Pulses:      Dorsalis pedis pulses are 2+ on the left side.  Pulmonary/Chest: Effort normal.  Abdominal: She exhibits no distension.  Musculoskeletal:       Left foot: There is tenderness and swelling (mild foot swelling).       Feet:  Neurological: She is alert.  Skin: Skin is warm and dry. No erythema.  Nursing note and vitals reviewed.      ED Treatments / Results  Labs (all labs ordered are  listed, but only abnormal results are displayed) Labs Reviewed  BASIC METABOLIC PANEL - Abnormal; Notable for the following components:      Result Value   Sodium 134 (*)    Glucose, Bld 411 (*)    All other components within normal limits  CBC WITH DIFFERENTIAL/PLATELET - Abnormal; Notable for the following components:   Hemoglobin 11.8 (*)    HCT 33.7 (*)    All other components within normal limits  SEDIMENTATION RATE - Abnormal; Notable for the following components:   Sed Rate 45 (*)    All other components within normal limits  CBG MONITORING, ED - Abnormal; Notable for the following components:   Glucose-Capillary 388 (*)    All other components within normal limits  C-REACTIVE PROTEIN  POC URINE PREG, ED  EKG None  Radiology Dg Foot Complete Right  Result Date: 09/26/2017 CLINICAL DATA:  Subacute onset of worsening wound at the bottom of the right foot. EXAM: RIGHT FOOT COMPLETE - 3+ VIEW COMPARISON:  None. FINDINGS: There is no evidence of fracture or dislocation. No osseous erosions are seen. The joint spaces are preserved. There is no evidence of talar subluxation; the subtalar joint is unremarkable in appearance. A soft tissue wound is noted at the plantar aspect of the forefoot. No radiopaque foreign bodies are seen. IMPRESSION: No evidence of fracture or dislocation. No radiopaque foreign bodies seen. Electronically Signed   By: Garald Balding M.D.   On: 09/26/2017 21:30    Procedures Procedures (including critical care time)  Medications Ordered in ED Medications  sodium chloride 0.9 % bolus 1,000 mL (0 mLs Intravenous Stopped 09/26/17 2245)     Initial Impression / Assessment and Plan / ED Course  I have reviewed the triage vital signs and the nursing notes.  Pertinent labs & imaging results that were available during my care of the patient were reviewed by me and considered in my medical decision making (see chart for details).     Patient's x-ray shows  no obvious osteomyelitis.  The wound overall appears well.  Unclear why it is a little discolored but it does not appear to be cellulitis and there does not appear to be an abscess clinically.  She is already on oral antibiotics.  Based on the work-up currently in the emergency department I see no reason to change her antibiotics.  She is hyperglycemic but no acidosis.  She was given IV fluids.  We discussed better glucose control as well as follow-up with Dr. Sharol Given.  Discussed return precautions.  Final Clinical Impressions(s) / ED Diagnoses   Final diagnoses:  Open wound of plantar aspect of foot, right, initial encounter  Hyperglycemia    ED Discharge Orders    None       Sherwood Gambler, MD 09/26/17 2334

## 2017-09-26 NOTE — Discharge Instructions (Addendum)
If your wound worsens, you develop purulent drainage, fever, redness, or increased pain or any other new/concerning symptoms and return to the ER for evaluation.  Otherwise follow-up with the orthopedist, Dr. Lajoyce Cornersuda.

## 2017-09-29 ENCOUNTER — Encounter: Payer: Self-pay | Admitting: Podiatry

## 2017-09-29 ENCOUNTER — Ambulatory Visit: Payer: BLUE CROSS/BLUE SHIELD | Admitting: Podiatry

## 2017-09-29 VITALS — Temp 97.1°F

## 2017-09-29 DIAGNOSIS — E1149 Type 2 diabetes mellitus with other diabetic neurological complication: Secondary | ICD-10-CM

## 2017-09-29 DIAGNOSIS — L97519 Non-pressure chronic ulcer of other part of right foot with unspecified severity: Secondary | ICD-10-CM | POA: Diagnosis not present

## 2017-09-29 DIAGNOSIS — E11621 Type 2 diabetes mellitus with foot ulcer: Secondary | ICD-10-CM

## 2017-09-29 NOTE — Progress Notes (Signed)
Subjective:    Patient ID: Kelli Hudson, female    DOB: [birth year], 27 y.o.   MRN: [id number]  HPI 27 year old female presents the office today for concerns of an ulceration which is been ongoing for the last several weeks on the right foot, submetatarsal 4.  She is going to urgent care, the emergency room and she is also seen Dr. Sharol Given for this.  She states that she has been applying antibiotic ointment on the area but she noticed that the area turning black around the wound she was concerned for this.  This is her first ulceration.  She has not had her A1c checked recently but her last A1c was around 14.  She denies any claudication symptoms.   Review of Systems  All other systems reviewed and are negative.  Past Medical History:  Diagnosis Date  . Diabetes mellitus without complication ([place])   . Hypertension     Past Surgical History:  Procedure Laterality Date  . ESOPHAGOGASTRODUODENOSCOPY (EGD) WITH PROPOFOL Left 09/08/2015   Procedure: ESOPHAGOGASTRODUODENOSCOPY (EGD) WITH PROPOFOL;  Surgeon: Arta Silence, MD;  Location: [facility] ENDOSCOPY;  Service: Endoscopy;  Laterality: Left;     Current Outpatient Medications:  .  Blood Glucose Monitoring Suppl (TRUE METRIX METER) w/Device KIT, 1 Device by Does not apply route 4 (four) times daily., Disp: 1 kit, Rfl: 0 .  cephALEXin (KEFLEX) 500 MG capsule, Take 500 mg by mouth 4 (four) times daily. , Disp: , Rfl: 0 .  glucose blood (TRUE METRIX BLOOD GLUCOSE TEST) test strip, Use as instructed, Disp: 100 each, Rfl: 12 .  HYDROcodone-acetaminophen (NORCO) 5-325 MG tablet, Take 1-2 tablets by mouth every 6 (six) hours as needed. (Patient not taking: Reported on 09/24/2017), Disp: 12 tablet, Rfl: 0 .  ibuprofen (ADVIL,MOTRIN) 200 MG tablet, Take 400-600 mg by mouth every 6 (six) hours as needed for moderate pain., Disp: , Rfl:  .  insulin aspart (NOVOLOG FLEXPEN) 100 UNIT/ML FlexPen, Inject 5 Units into the skin 3 (three) times daily with  meals. (Patient taking differently: Inject 10-15 Units into the skin 3 (three) times daily with meals. Sliding scale), Disp: 15 mL, Rfl: 11 .  Insulin Detemir (LEVEMIR FLEXPEN) 100 UNIT/ML Pen, Inject 15 Units into the skin daily at 10 pm. (Patient not taking: Reported on 09/24/2017), Disp: 15 mL, Rfl: 11 .  Insulin Glargine (LANTUS SOLOSTAR) 100 UNIT/ML Solostar Pen, Inject 15 Units into the skin at bedtime. , Disp: , Rfl:  .  Insulin Syringe-Needle U-100 (BD INSULIN SYRINGE ULTRAFINE) 31G X 15/64" 0.5 ML MISC, 1 each by Does not apply route 2 (two) times daily., Disp: 60 each, Rfl: 5 .  metFORMIN (GLUCOPHAGE-XR) 500 MG 24 hr tablet, Take 500 mg by mouth 2 (two) times daily. , Disp: , Rfl: 3 .  metoCLOPramide (REGLAN) 10 MG tablet, Take 1 tablet (10 mg total) by mouth 4 (four) times daily -  before meals and at bedtime. (Patient not taking: Reported on 09/24/2017), Disp: 120 tablet, Rfl: 1 .  Multiple Vitamins-Minerals (HAIR SKIN AND NAILS FORMULA PO), Take 3 tablets by mouth daily., Disp: , Rfl:  .  naproxen (NAPROSYN) 500 MG tablet, Take 1 tablet (500 mg total) by mouth 2 (two) times daily. (Patient not taking: Reported on 09/26/2017), Disp: 20 tablet, Rfl: 0 .  pantoprazole (PROTONIX) 40 MG tablet, Take 1 tablet (40 mg total) by mouth daily. (Patient not taking: Reported on 09/26/2017), Disp: 30 tablet, Rfl: 0 .  TRUEPLUS LANCETS 28G MISC, assist with checking  blood sugar TID and qhs, Disp: 100 each, Rfl: 3  No Known Allergies       Objective:   Physical Exam General: AAO x3, NAD  Dermatological: On the plantar aspect the right foot submetatarsal 4 areas of ulceration measuring approximate 0.7 x 0.5 x 0.2 cm.  There is dried blood around the edges and upon debridement of this there is new, healthy skin underlying the area.  There is no fluctuation or crepitation there is no drainage or pus.  Minimal swelling to the foot there is no erythema or increase in warmth.  No other open lesions or  pre-ulcerative lesions.  Vascular: Dorsalis Pedis artery and Posterior Tibial artery pedal pulses are 2/4 bilateral with immedate capillary fill time. PThere is no pain with calf compression, swelling, warmth, erythema.   Neruologic: Sensation decreased with Derrel Nip monofilament  Musculoskeletal: No gross boney pedal deformities bilateral. No pain, crepitus, or limitation noted with foot and ankle range of motion bilateral. Muscular strength 5/5 in all groups tested bilateral.      Assessment & Plan:  Ulceration right foot -Treatment options discussed including all alternatives, risks, and complications -Etiology of symptoms were discussed -I reviewed her chart as well as her x-rays previously. -Today I sharply debrided the wound utilizing a #312 blade scalpel without any complications of bleeding to remove all nonviable tissue down to healthy, bleeding tissue.  We are going to switch to [facility] dressing changes daily and will give her some of this today.  Offloading shoe was also dispensed an offloading pad.  She did not wear this at work and she has to work.  I dispensed offloading pads to wear in the knee at work.  Finish the course of antibiotics.  Elevation try to limit her activities much as possible. -Monitor for any clinical signs or symptoms of infection and directed to call the office immediately should any occur or go to the ER.  Return in about 1 week (around 10/06/2017).  Trula Slade DPM

## 2017-09-29 NOTE — Patient Instructions (Signed)
Apply medihoney to the wound daily Monitor for any signs/symptoms of infection. Call the office immediately if any occur or go directly to the emergency room. Call with any questions/concerns.

## 2017-09-30 ENCOUNTER — Telehealth: Payer: Self-pay | Admitting: *Deleted

## 2017-09-30 NOTE — Telephone Encounter (Signed)
Called and left a message for the patient to call me back. Genia Perin 

## 2017-10-08 ENCOUNTER — Ambulatory Visit: Payer: BLUE CROSS/BLUE SHIELD | Admitting: Podiatry

## 2017-10-08 ENCOUNTER — Encounter: Payer: Self-pay | Admitting: Podiatry

## 2017-10-08 VITALS — Temp 98.7°F

## 2017-10-08 DIAGNOSIS — E1149 Type 2 diabetes mellitus with other diabetic neurological complication: Secondary | ICD-10-CM

## 2017-10-08 DIAGNOSIS — E11621 Type 2 diabetes mellitus with foot ulcer: Secondary | ICD-10-CM | POA: Diagnosis not present

## 2017-10-08 DIAGNOSIS — L97519 Non-pressure chronic ulcer of other part of right foot with unspecified severity: Secondary | ICD-10-CM

## 2017-10-09 NOTE — Progress Notes (Signed)
Subjective: 27 year old female presents the office today with her wife for follow-up evaluation of a wound on the plantar aspect the right foot.  She has been doing daily dressing changes and she has been applying Medihoney and they feel that the wound is getting much better.  Denies any drainage or pus and the swelling is also much improved.  Overall she feels well she denies any systemic complaints such as fevers, chills, nausea, vomiting. No acute changes since last appointment, and no other complaints at this time.   Objective: AAO x3, NAD DP/PT pulses palpable bilaterally, CRT less than 3 seconds On the plantar the right fifth metatarsal for an ulceration with mild hyperkeratotic periwound.  The wound measures 0.4 x 0.2 x 0.2 cm.  There is no probing to bone, undermining or tunneling.  There is no external erythema, ascending cellulitis.  No fluctuation or crepitation or any malodor. No open lesions or pre-ulcerative lesions.  No pain with calf compression, swelling, warmth, erythema  Assessment: Ulceration right foot with improvement  Plan: -All treatment options discussed with the patient including all alternatives, risks, complications.  -Overall the wound is doing better.  I did sharply debride the wound after the area skin with alcohol utilizing #312 blade scalpel down to healthy, granular tissue.  Continue with daily dressing changes as before and offloading all times.  She is wearing the surgical shoe with the offloading pad. -Monitor for any clinical signs or symptoms of infection and directed to call the office immediately should any occur or go to the ER. -Patient encouraged to call the office with any questions, concerns, change in symptoms.   Vivi BarrackMatthew R Garritt Molyneux DPM

## 2017-10-22 ENCOUNTER — Ambulatory Visit: Payer: BLUE CROSS/BLUE SHIELD | Admitting: Podiatry

## 2017-10-22 DIAGNOSIS — E11621 Type 2 diabetes mellitus with foot ulcer: Secondary | ICD-10-CM | POA: Diagnosis not present

## 2017-10-22 DIAGNOSIS — E1149 Type 2 diabetes mellitus with other diabetic neurological complication: Secondary | ICD-10-CM

## 2017-10-22 DIAGNOSIS — L97519 Non-pressure chronic ulcer of other part of right foot with unspecified severity: Secondary | ICD-10-CM

## 2017-10-26 ENCOUNTER — Ambulatory Visit (INDEPENDENT_AMBULATORY_CARE_PROVIDER_SITE_OTHER): Payer: BLUE CROSS/BLUE SHIELD | Admitting: Orthopedic Surgery

## 2017-10-26 NOTE — Progress Notes (Signed)
Subjective: 27 year old female presents the office today for evaluation of a wound to the bottom of her right foot.  Overall she states that she is doing much better.  Denies any drainage or pus or any swelling or redness to her feet.  She has been continue with daily dressing changes with next appointment.  She has no other concerns today. Denies any systemic complaints such as fevers, chills, nausea, vomiting. No acute changes since last appointment, and no other complaints at this time.   Objective: AAO x3, NAD DP/PT pulses palpable bilaterally, CRT less than 3 seconds On the plantar aspect of the right foot is a small superficial pinpoint wound with mild surrounding hyperkeratotic tissue.  There is no surrounding erythema there is no ascending cellulitis.  There is no fluctuation or crepitation or malodor.  Overall the wound appears to be doing much better and is no signs of infection present today. No open lesions or pre-ulcerative lesions.  No pain with calf compression, swelling, warmth, erythema  Assessment: Healing ulceration right  Plan: -All treatment options discussed with the patient including all alternatives, risks, complications.  -Today I sharply debrided the wound after the area skin with alcohol utilizing #312 blade scalpel down to healthy, granular tissue.  Overall the wound is doing much better and appears to be almost healed.  Continue with daily dressing changes and offloading all times.  Monitoring signs or symptoms of infection. -Patient encouraged to call the office with any questions, concerns, change in symptoms.   Vivi Barrack DPM

## 2017-11-09 ENCOUNTER — Ambulatory Visit: Payer: BLUE CROSS/BLUE SHIELD | Admitting: Podiatry

## 2017-11-09 DIAGNOSIS — E11621 Type 2 diabetes mellitus with foot ulcer: Secondary | ICD-10-CM

## 2017-11-09 DIAGNOSIS — E1149 Type 2 diabetes mellitus with other diabetic neurological complication: Secondary | ICD-10-CM

## 2017-11-09 DIAGNOSIS — L97519 Non-pressure chronic ulcer of other part of right foot with unspecified severity: Secondary | ICD-10-CM | POA: Diagnosis not present

## 2017-11-10 NOTE — Progress Notes (Signed)
Subjective: 27 year old female presents the office today for evaluation of a wound to the bottom of her right foot.  Overall she states that she is doing much better and the wound has been healing well.  She does state that the skin got dry and she did peel a piece of skin off.  Denies any drainage or pus or any swelling or redness to her feet.  She has been continue with daily dressing changes with next appointment.  She has no other concerns today. Denies any systemic complaints such as fevers, chills, nausea, vomiting. No acute changes since last appointment, and no other complaints at this time.   Objective: AAO x3, NAD DP/PT pulses palpable bilaterally, CRT less than 3 seconds On the plantar aspect of the right foot on the area the previous ulceration is a hyperkeratotic lesion.  Upon debridement appears the underlying wound is healed.  Distal to this area was a piece of dry skin that almost is a skin tear with a superficial area.  There is no drainage or pus.  There is no surrounding erythema, ascending sialitis.  There is no fluctuation or crepitation or any malodor.  No open lesions or pre-ulcerative lesions.  No pain with calf compression, swelling, warmth, erythema  Assessment: Healing ulceration right  Plan: -All treatment options discussed with the patient including all alternatives, risks, complications.  -Today I sharply debrided the wound after the area skin with alcohol utilizing #312 blade scalpel down to healthy tissue present and the wound is healed.  Distal to the area of the skin tear superficial abrasion type lesion.  Recommend antibiotic ointment and a bandage to the area. -Monitor for any clinical signs or symptoms of infection and directed to call the office immediately should any occur or go to the ER. -Follow-up in 3 weeks if not healed or sooner if any issues are to arise otherwise I will see her back in the next 3 to 6 months for diabetic foot check.  Kelli Hudson  DPM

## 2017-12-03 ENCOUNTER — Ambulatory Visit: Payer: BLUE CROSS/BLUE SHIELD | Admitting: Podiatry

## 2018-02-11 ENCOUNTER — Ambulatory Visit: Payer: BLUE CROSS/BLUE SHIELD | Admitting: Podiatry

## 2018-02-11 ENCOUNTER — Ambulatory Visit (INDEPENDENT_AMBULATORY_CARE_PROVIDER_SITE_OTHER): Payer: BLUE CROSS/BLUE SHIELD

## 2018-02-11 ENCOUNTER — Encounter: Payer: Self-pay | Admitting: Podiatry

## 2018-02-11 VITALS — Temp 99.0°F

## 2018-02-11 DIAGNOSIS — L97529 Non-pressure chronic ulcer of other part of left foot with unspecified severity: Secondary | ICD-10-CM

## 2018-02-11 DIAGNOSIS — R2242 Localized swelling, mass and lump, left lower limb: Secondary | ICD-10-CM

## 2018-02-11 MED ORDER — MUPIROCIN 2 % EX OINT: 1.0000 "application " | TOPICAL_OINTMENT | Freq: Two times a day (BID) | CUTANEOUS | 2 refills | Status: DC

## 2018-02-11 MED ORDER — CEPHALEXIN 500 MG PO CAPS: 500.0000 mg | ORAL_CAPSULE | Freq: Three times a day (TID) | ORAL | 0 refills | Status: DC

## 2018-02-11 NOTE — Progress Notes (Signed)
Subjective: 27 year old female presents the office today for new concerns of a wound to her left fifth toe.  She states that she has some swelling to the foot and she noticed her toe was not looking very good she has not noted drainage coming from the area which open.  Denies any pain, clear areas of drainage or pus.  No other concerns today.  No recent treatment. Denies any systemic complaints such as fevers, chills, nausea, vomiting. No acute changes since last appointment, and no other complaints at this time.   Objective: AAO x3, NAD DP/PT pulses palpable bilaterally, CRT less than 3 seconds Adductovarus toes on the dorsal lateral aspect the left fifth toe on the PIPJ is an ulceration measuring approximate 0.5 x 0 cm.  Has a depth of.  There is no probing to bone although it is getting closed.  There is no fluctuation or crepitation.  Small moderate drainage, bloody drainage expressed but no purulence.  There is edema to the toe as well as the foot in general no significant erythema or increase in warmth of the foot. no open lesions or pre-ulcerative lesions.  No pain with calf compression, swelling, warmth, erythema  Assessment: Toe wound with left foot swelling  Plan: -All treatment options discussed with the patient including all alternatives, risks, complications.  -X-rays were obtained and reviewed.  No definitive evidence of cortical destruction suggestive of osteomyelitis of the 5th toe and there is no evidence of soft tissue emphysema.  -The wound on the left fifth toe today without any complications or bleeding or any use mupirocin ointment dressing changes daily which I prescribed today.  Also been prescribed Keflex.  Surgical shoe was dispensed for offloading.  Try to limit activity.  Elevation.  Monitoring signs or symptoms of worsening infection call the office or go immediately to the emergency room. -Patient encouraged to call the office with any questions, concerns, change in  symptoms.   Kelli BarrackMatthew R Demir Hudson DPM

## 2018-02-18 ENCOUNTER — Ambulatory Visit: Payer: BLUE CROSS/BLUE SHIELD | Admitting: Podiatry

## 2018-02-18 VITALS — Temp 99.2°F

## 2018-02-18 DIAGNOSIS — L02612 Cutaneous abscess of left foot: Secondary | ICD-10-CM

## 2018-02-18 DIAGNOSIS — L97529 Non-pressure chronic ulcer of other part of left foot with unspecified severity: Secondary | ICD-10-CM

## 2018-02-18 DIAGNOSIS — E1149 Type 2 diabetes mellitus with other diabetic neurological complication: Secondary | ICD-10-CM

## 2018-02-18 DIAGNOSIS — L03032 Cellulitis of left toe: Secondary | ICD-10-CM

## 2018-02-18 MED ORDER — DOXYCYCLINE HYCLATE 100 MG PO TABS: 100.0000 mg | ORAL_TABLET | Freq: Two times a day (BID) | ORAL | 0 refills | Status: DC

## 2018-02-21 ENCOUNTER — Other Ambulatory Visit: Payer: Self-pay

## 2018-02-21 ENCOUNTER — Emergency Department (HOSPITAL_COMMUNITY): Payer: BLUE CROSS/BLUE SHIELD

## 2018-02-21 ENCOUNTER — Encounter (HOSPITAL_COMMUNITY): Payer: Self-pay

## 2018-02-21 ENCOUNTER — Inpatient Hospital Stay (HOSPITAL_COMMUNITY)
Admission: EM | Admit: 2018-02-21 | Discharge: 2018-02-23 | DRG: 639 | Disposition: A | Discharge status: Home / Self Care | Payer: BLUE CROSS/BLUE SHIELD | Attending: Internal Medicine | Admitting: Internal Medicine

## 2018-02-21 ENCOUNTER — Inpatient Hospital Stay (HOSPITAL_COMMUNITY): Payer: BLUE CROSS/BLUE SHIELD

## 2018-02-21 DIAGNOSIS — K3184 Gastroparesis: Secondary | ICD-10-CM | POA: Diagnosis present

## 2018-02-21 DIAGNOSIS — B952 Enterococcus as the cause of diseases classified elsewhere: Secondary | ICD-10-CM | POA: Diagnosis present

## 2018-02-21 DIAGNOSIS — D649 Anemia, unspecified: Secondary | ICD-10-CM | POA: Diagnosis present

## 2018-02-21 DIAGNOSIS — E1165 Type 2 diabetes mellitus with hyperglycemia: Secondary | ICD-10-CM | POA: Diagnosis present

## 2018-02-21 DIAGNOSIS — E08621 Diabetes mellitus due to underlying condition with foot ulcer: Secondary | ICD-10-CM | POA: Diagnosis not present

## 2018-02-21 DIAGNOSIS — R739 Hyperglycemia, unspecified: Secondary | ICD-10-CM | POA: Diagnosis not present

## 2018-02-21 DIAGNOSIS — Z818 Family history of other mental and behavioral disorders: Secondary | ICD-10-CM | POA: Diagnosis not present

## 2018-02-21 DIAGNOSIS — L97522 Non-pressure chronic ulcer of other part of left foot with fat layer exposed: Secondary | ICD-10-CM | POA: Diagnosis present

## 2018-02-21 DIAGNOSIS — L97401 Non-pressure chronic ulcer of unspecified heel and midfoot limited to breakdown of skin: Secondary | ICD-10-CM | POA: Diagnosis not present

## 2018-02-21 DIAGNOSIS — L03032 Cellulitis of left toe: Secondary | ICD-10-CM | POA: Diagnosis present

## 2018-02-21 DIAGNOSIS — L97529 Non-pressure chronic ulcer of other part of left foot with unspecified severity: Secondary | ICD-10-CM

## 2018-02-21 DIAGNOSIS — E11621 Type 2 diabetes mellitus with foot ulcer: Secondary | ICD-10-CM | POA: Diagnosis not present

## 2018-02-21 DIAGNOSIS — I10 Essential (primary) hypertension: Secondary | ICD-10-CM | POA: Diagnosis present

## 2018-02-21 DIAGNOSIS — E11628 Type 2 diabetes mellitus with other skin complications: Secondary | ICD-10-CM | POA: Diagnosis present

## 2018-02-21 DIAGNOSIS — Z87891 Personal history of nicotine dependence: Secondary | ICD-10-CM

## 2018-02-21 DIAGNOSIS — E1110 Type 2 diabetes mellitus with ketoacidosis without coma: Secondary | ICD-10-CM | POA: Diagnosis present

## 2018-02-21 DIAGNOSIS — Z801 Family history of malignant neoplasm of trachea, bronchus and lung: Secondary | ICD-10-CM

## 2018-02-21 DIAGNOSIS — E10621 Type 1 diabetes mellitus with foot ulcer: Secondary | ICD-10-CM | POA: Diagnosis not present

## 2018-02-21 DIAGNOSIS — L97509 Non-pressure chronic ulcer of other part of unspecified foot with unspecified severity: Secondary | ICD-10-CM

## 2018-02-21 DIAGNOSIS — E1143 Type 2 diabetes mellitus with diabetic autonomic (poly)neuropathy: Secondary | ICD-10-CM | POA: Diagnosis present

## 2018-02-21 LAB — COMPREHENSIVE METABOLIC PANEL
ALK PHOS: 96 U/L (ref 38–126)
ALT: 10 U/L (ref 0–44)
ANION GAP: 10 (ref 5–15)
AST: 12 U/L — ABNORMAL LOW (ref 15–41)
Albumin: 3.1 g/dL — ABNORMAL LOW (ref 3.5–5.0)
BUN: 10 mg/dL (ref 6–20)
CALCIUM: 9.4 mg/dL (ref 8.9–10.3)
CO2: 27 mmol/L (ref 22–32)
Chloride: 98 mmol/L (ref 98–111)
Creatinine, Ser: 0.78 mg/dL (ref 0.44–1.00)
GFR calc non Af Amer: >60 mL/min (ref >60)
Glucose, Bld: 329 mg/dL — ABNORMAL HIGH (ref 70–99)
Potassium: 3.9 mmol/L (ref 3.5–5.1)
Sodium: 135 mmol/L (ref 135–145)
TOTAL PROTEIN: 8.6 g/dL — AB (ref 6.5–8.1)
Total Bilirubin: 0.5 mg/dL (ref 0.3–1.2)

## 2018-02-21 LAB — CBC WITH DIFFERENTIAL/PLATELET
Abs Immature Granulocytes: 0.05 10*3/uL (ref 0.00–0.07)
BASOS ABS: 0 10*3/uL (ref 0.0–0.1)
BASOS PCT: 0 %
EOS ABS: 0.1 10*3/uL (ref 0.0–0.5)
Eosinophils Relative: 1 %
HCT: 36.8 % (ref 36.0–46.0)
Hemoglobin: 11.9 g/dL — ABNORMAL LOW (ref 12.0–15.0)
Immature Granulocytes: 0 %
Lymphocytes Relative: 21 %
Lymphs Abs: 2.5 10*3/uL (ref 0.7–4.0)
MCH: 28.3 pg (ref 26.0–34.0)
MCHC: 32.3 g/dL (ref 30.0–36.0)
MCV: 87.4 fL (ref 80.0–100.0)
Monocytes Absolute: 0.9 10*3/uL (ref 0.1–1.0)
Monocytes Relative: 8 %
NRBC: 0 % (ref 0.0–0.2)
Neutro Abs: 8.2 10*3/uL — ABNORMAL HIGH (ref 1.7–7.7)
Neutrophils Relative %: 70 %
PLATELETS: 492 10*3/uL — AB (ref 150–400)
RBC: 4.21 MIL/uL (ref 3.87–5.11)
RDW: 12.1 % (ref 11.5–15.5)
WBC: 11.9 10*3/uL — ABNORMAL HIGH (ref 4.0–10.5)

## 2018-02-21 LAB — WOUND CULTURE
GRAM STAIN:: NONE SEEN
MICRO NUMBER:: 3241
SPECIMEN QUALITY:: ADEQUATE

## 2018-02-21 LAB — CG4 I-STAT (LACTIC ACID): LACTIC ACID, VENOUS: 1.06 mmol/L (ref 0.5–1.9)

## 2018-02-21 LAB — GLUCOSE, CAPILLARY: Glucose-Capillary: 195 mg/dL — ABNORMAL HIGH (ref 70–99)

## 2018-02-21 LAB — I-STAT BETA HCG BLOOD, ED (NOT ORDERABLE)

## 2018-02-21 MED ORDER — HYDRALAZINE HCL 20 MG/ML IJ SOLN
10.0000 mg | Freq: Four times a day (QID) | INTRAMUSCULAR | Status: DC | PRN
  Filled 2018-02-21: qty 1

## 2018-02-21 MED ORDER — INSULIN ASPART 100 UNIT/ML ~~LOC~~ SOLN
0.0000 [IU] | Freq: Three times a day (TID) | SUBCUTANEOUS | Status: DC
  Administered 2018-02-22: 5 [IU] via SUBCUTANEOUS
  Administered 2018-02-22 (×2): 3 [IU] via SUBCUTANEOUS
  Administered 2018-02-23: 5 [IU] via SUBCUTANEOUS
  Administered 2018-02-23: 3 [IU] via SUBCUTANEOUS

## 2018-02-21 MED ORDER — PIPERACILLIN-TAZOBACTAM 3.375 G IVPB 30 MIN
3.3750 g | Freq: Once | INTRAVENOUS | Status: AC
  Administered 2018-02-21: 3.375 g via INTRAVENOUS
  Filled 2018-02-21: qty 50

## 2018-02-21 MED ORDER — INSULIN ASPART 100 UNIT/ML ~~LOC~~ SOLN
5.0000 [IU] | Freq: Once | SUBCUTANEOUS | Status: AC
  Administered 2018-02-21: 5 [IU] via SUBCUTANEOUS
  Filled 2018-02-21: qty 1

## 2018-02-21 MED ORDER — MORPHINE SULFATE (PF) 2 MG/ML IV SOLN: 2.0000 mg | INTRAVENOUS | Status: DC | PRN

## 2018-02-21 MED ORDER — ENOXAPARIN SODIUM 40 MG/0.4ML ~~LOC~~ SOLN
40.0000 mg | SUBCUTANEOUS | Status: DC
  Administered 2018-02-22 – 2018-02-23 (×2): 40 mg via SUBCUTANEOUS
  Filled 2018-02-21 (×2): qty 0.4

## 2018-02-21 MED ORDER — SODIUM CHLORIDE 0.9 % IV SOLN: 2.0000 g | Freq: Three times a day (TID) | INTRAVENOUS | Status: DC

## 2018-02-21 MED ORDER — OXYCODONE HCL 5 MG PO TABS
5.0000 mg | ORAL_TABLET | Freq: Four times a day (QID) | ORAL | Status: DC | PRN
  Administered 2018-02-21 – 2018-02-23 (×4): 5 mg via ORAL
  Filled 2018-02-21 (×4): qty 1

## 2018-02-21 MED ORDER — INSULIN GLARGINE 100 UNIT/ML ~~LOC~~ SOLN
15.0000 [IU] | Freq: Every day | SUBCUTANEOUS | Status: DC
  Administered 2018-02-21 – 2018-02-22 (×2): 15 [IU] via SUBCUTANEOUS
  Filled 2018-02-21 (×2): qty 0.15

## 2018-02-21 MED ORDER — SODIUM CHLORIDE 0.9 % IV BOLUS
1000.0000 mL | Freq: Once | INTRAVENOUS | Status: AC
  Administered 2018-02-21: 1000 mL via INTRAVENOUS

## 2018-02-21 MED ORDER — BACITRACIN ZINC 500 UNIT/GM EX OINT
TOPICAL_OINTMENT | Freq: Two times a day (BID) | CUTANEOUS | Status: DC
  Administered 2018-02-22 – 2018-02-23 (×4): via TOPICAL
  Filled 2018-02-21: qty 28.35

## 2018-02-21 MED ORDER — MORPHINE SULFATE (PF) 4 MG/ML IV SOLN
4.0000 mg | Freq: Once | INTRAVENOUS | Status: AC
  Administered 2018-02-21: 4 mg via INTRAVENOUS
  Filled 2018-02-21: qty 1

## 2018-02-21 MED ORDER — ACETAMINOPHEN 325 MG PO TABS: 650.0000 mg | ORAL_TABLET | Freq: Four times a day (QID) | ORAL | Status: DC | PRN

## 2018-02-21 MED ORDER — SODIUM CHLORIDE 0.9 % IV SOLN
3.0000 g | Freq: Four times a day (QID) | INTRAVENOUS | Status: DC
  Administered 2018-02-21 – 2018-02-23 (×8): 3 g via INTRAVENOUS
  Filled 2018-02-21 (×8): qty 3

## 2018-02-21 MED ORDER — VANCOMYCIN HCL IN DEXTROSE 1-5 GM/200ML-% IV SOLN
1000.0000 mg | Freq: Once | INTRAVENOUS | Status: AC
  Administered 2018-02-21: 1000 mg via INTRAVENOUS
  Filled 2018-02-21: qty 200

## 2018-02-21 MED ORDER — GADOBUTROL 1 MMOL/ML IV SOLN
10.0000 mL | Freq: Once | INTRAVENOUS | Status: AC | PRN
  Administered 2018-02-21: 8 mL via INTRAVENOUS

## 2018-02-21 MED ORDER — METOPROLOL TARTRATE 5 MG/5ML IV SOLN
5.0000 mg | Freq: Once | INTRAVENOUS | Status: AC
  Administered 2018-02-21: 5 mg via INTRAVENOUS
  Filled 2018-02-21: qty 5

## 2018-02-21 NOTE — ED Provider Notes (Signed)
[facility] DEPT Provider Note   CSN: [id number] Arrival date & time: 02/21/18  1415     History   Chief Complaint Chief Complaint  Patient presents with  . Wound Check    HPI Kelli Hudson is a 28 y.o. female history of diabetes, hypertension, here presenting with wound in the left foot.  Patient states that she had a blister in the left fifth toe that was there for several weeks.  Patient has seen her podiatrist, Dr. Jacqualyn Posey, for follow-up.  Patient states that the blister popped about 10 days ago and she saw her foot doctor and was put on doxycycline.  Patient states that she has intermittent chills and worsening pain in the left foot.  Also admits to persistent drainage from the ulcer and progressive redness now and covering the entire foot.  Patient is diabetic and states that her sugar has been running elevated.  The history is provided by the patient.    Past Medical History:  Diagnosis Date  . Diabetes mellitus without complication ([place])   . Hypertension     Patient Active Problem List   Diagnosis Date Noted  . Midfoot skin ulcer, right, limited to breakdown of skin ([place]) 09/24/2017  . Diabetic polyneuropathy associated with type 2 diabetes mellitus ([place]) 09/24/2017  . Pain in right foot 09/24/2017  . Diabetic ketoacidosis without coma associated with type 2 diabetes mellitus ([place]) 01/02/2016  . Gastroparesis   . Leukocytosis   . Hyperglycemia   . Abdominal pain   . Diabetes mellitus due to underlying condition, uncontrolled, with hyperosmolarity without coma, without long-term current use of insulin ([place])   . Increased frequency of urination 06/14/2011  . UTI (urinary tract infection) 06/14/2011  . THYROMEGALY 10/05/2009  . Uncontrolled diabetes mellitus ([place]) 10/05/2009  . Essential hypertension 10/05/2009    Past Surgical History:  Procedure Laterality Date  . ESOPHAGOGASTRODUODENOSCOPY (EGD) WITH PROPOFOL Left 09/08/2015     Procedure: ESOPHAGOGASTRODUODENOSCOPY (EGD) WITH PROPOFOL;  Surgeon: Arta Silence, MD;  Location: [facility] ENDOSCOPY;  Service: Endoscopy;  Laterality: Left;     OB History   No obstetric history on file.      Home Medications    Prior to Admission medications   Medication Sig Start Date End Date Taking? Authorizing Provider  doxycycline (VIBRA-TABS) 100 MG tablet Take 1 tablet (100 mg total) by mouth 2 (two) times daily. 02/18/18  Yes Trula Slade, DPM  ibuprofen (ADVIL,MOTRIN) 200 MG tablet Take 400-600 mg by mouth every 6 (six) hours as needed for headache or moderate pain.    Yes [provider]  mupirocin ointment (BACTROBAN) 2 % Apply 1 application topically 2 (two) times daily. 02/11/18  Yes Trula Slade, DPM  Blood Glucose Monitoring Suppl (TRUE METRIX METER) w/Device KIT 1 Device by Does not apply route 4 (four) times daily. 09/10/15   Brayton Caves, PA-C  cephALEXin (KEFLEX) 500 MG capsule Take 1 capsule (500 mg total) by mouth 3 (three) times daily. Patient not taking: Reported on 02/21/2018 02/11/18   Trula Slade, DPM  glucose blood (TRUE METRIX BLOOD GLUCOSE TEST) test strip Use as instructed 09/10/15   Brayton Caves, PA-C  HYDROcodone-acetaminophen (NORCO) 5-325 MG tablet Take 1-2 tablets by mouth every 6 (six) hours as needed. Patient not taking: Reported on 02/21/2018 01/06/16   Veryl Speak, MD  insulin aspart (NOVOLOG FLEXPEN) 100 UNIT/ML FlexPen Inject 5 Units into the skin 3 (three) times daily with meals. Patient not taking: Reported on  02/21/2018 01/05/16   Burgess Estelle, MD  Insulin Detemir (LEVEMIR FLEXPEN) 100 UNIT/ML Pen Inject 15 Units into the skin daily at 10 pm. Patient not taking: Reported on 02/21/2018 01/05/16   Burgess Estelle, MD  Insulin Syringe-Needle U-100 (BD INSULIN SYRINGE ULTRAFINE) 31G X 15/64" 0.5 ML MISC 1 each by Does not apply route 2 (two) times daily. 09/19/15   Charlott Rakes, MD  metoCLOPramide (REGLAN) 10 MG tablet Take  1 tablet (10 mg total) by mouth 4 (four) times daily -  before meals and at bedtime. Patient not taking: Reported on 02/21/2018 01/04/16   Holley Raring, MD  naproxen (NAPROSYN) 500 MG tablet Take 1 tablet (500 mg total) by mouth 2 (two) times daily. Patient not taking: Reported on 02/21/2018 08/22/16   Orpah Greek, MD  pantoprazole (PROTONIX) 40 MG tablet Take 1 tablet (40 mg total) by mouth daily. Patient not taking: Reported on 02/21/2018 08/16/16   Recardo Evangelist, PA-C  TRUEPLUS LANCETS 28G MISC assist with checking blood sugar TID and qhs 09/10/15   Brayton Caves, PA-C    Family History Family History  Problem Relation Age of Onset  . Lung cancer Mother   . Bipolar disorder Father     Social History Social History   Tobacco Use  . Smoking status: Former Research scientist (life sciences)  . Smokeless tobacco: Never Used  Substance Use Topics  . Alcohol use: Yes    Alcohol/week: 1.0 standard drinks    Types: 1 Shots of liquor per week    Comment: occasional   . Drug use: No     Allergies   Patient has no known allergies.   Review of Systems Review of Systems  Constitutional: Positive for fever.  Skin: Positive for wound.  All other systems reviewed and are negative.    Physical Exam Updated Vital Signs BP 119/74 (BP Location: Right Arm)   Pulse (!) 126   Temp 99.4 F (37.4 C)   Resp 18   Ht _0  (1.727 m)   Wt 81.6 kg   LMP 01/31/2018 (Approximate)   SpO2 99%   BMI 27.37 kg/m   Physical Exam Vitals signs and nursing note reviewed.  HENT:     Head: Normocephalic.     Nose: Nose normal.     Mouth/Throat:     Mouth: Mucous membranes are moist.  Eyes:     Extraocular Movements: Extraocular movements intact.     Pupils: Pupils are equal, round, and reactive to light.  Neck:     Musculoskeletal: Normal range of motion.  Cardiovascular:     Rate and Rhythm: Tachycardia present.  Pulmonary:     Effort: Pulmonary effort is normal.     Breath sounds: Normal breath  sounds.  Abdominal:     General: Abdomen is flat.     Palpations: Abdomen is soft.  Musculoskeletal:     Comments: L 5th toe with large ulcer with some purulent drainage. There is redness of the dorsum of the foot. 2+ DP pulses   Skin:    General: Skin is warm.     Capillary Refill: Capillary refill takes less than 2 seconds.     Findings: Erythema present.  Neurological:     General: No focal deficit present.     Mental Status: She is alert.  Psychiatric:        Mood and Affect: Mood normal.         ED Treatments / Results  Labs (all labs ordered are listed,  but only abnormal results are displayed) Labs Reviewed  COMPREHENSIVE METABOLIC PANEL - Abnormal; Notable for the following components:      Result Value   Glucose, Bld 329 (*)    Total Protein 8.6 (*)    Albumin 3.1 (*)    AST 12 (*)    All other components within normal limits  CBC WITH DIFFERENTIAL/PLATELET - Abnormal; Notable for the following components:   WBC 11.9 (*)    Hemoglobin 11.9 (*)    Platelets 492 (*)    Neutro Abs 8.2 (*)    All other components within normal limits  CULTURE, BLOOD (ROUTINE X 2)  CULTURE, BLOOD (ROUTINE X 2)  URINALYSIS, ROUTINE W REFLEX MICROSCOPIC  I-STAT CG4 LACTIC ACID, ED  I-STAT BETA HCG BLOOD, ED ([facility], [facility], [facility] ONLY)  CG4 I-STAT (LACTIC ACID)  I-STAT BETA HCG BLOOD, ED (NOT ORDERABLE)  I-STAT CG4 LACTIC ACID, ED    EKG None  Radiology Dg Chest 2 View  Result Date: 02/21/2018 CLINICAL DATA:  LEFT foot wound, blister popped, pain, former smoker, fever, tachycardia, diabetes mellitus EXAM: CHEST - 2 VIEW COMPARISON:  08/22/2016 FINDINGS: Normal heart size, mediastinal contours, and pulmonary vascularity. Lungs clear. No pleural effusion or pneumothorax. Bones unremarkable. IMPRESSION: Normal exam. Electronically Signed   By: Lavonia Dana M.D.   On: 02/21/2018 16:15   Dg Foot Complete Left  Result Date: 02/21/2018 CLINICAL DATA:  LEFT foot wound, blister popped, lot of  pain, swelling, question osteomyelitis, history diabetes mellitus EXAM: LEFT FOOT - COMPLETE 3+ VIEW COMPARISON:  02/11/2018 FINDINGS: Osseous mineralization normal. Joint spaces preserved. Dressing artifacts at fifth toe. Soft tissue swelling at dorsum of LEFT foot. No acute fracture, dislocation or bone destruction. IMPRESSION: Soft tissue swelling without acute bony abnormalities. Electronically Signed   By: Lavonia Dana M.D.   On: 02/21/2018 16:17    Procedures Procedures (including critical care time)  Medications Ordered in ED Medications  vancomycin (VANCOCIN) IVPB 1000 mg/200 mL premix (1,000 mg Intravenous New Bag/Given 02/21/18 1758)  sodium chloride 0.9 % bolus 1,000 mL (1,000 mLs Intravenous New Bag/Given 02/21/18 1707)  piperacillin-tazobactam (ZOSYN) IVPB 3.375 g (0 g Intravenous Stopped 02/21/18 1757)  morphine 4 MG/ML injection 4 mg (4 mg Intravenous Given 02/21/18 1710)  insulin aspart (novoLOG) injection 5 Units (5 Units Subcutaneous Given 02/21/18 1757)     Initial Impression / Assessment and Plan / ED Course  I have reviewed the triage vital signs and the nursing notes.  Pertinent labs & imaging results that were available during my care of the patient were reviewed by me and considered in my medical decision making (see chart for details).    Brinleigh Tew is a 28 y.o. female here with L foot ulcer. Known ulcer and is already on doxycycline. Patient has chills and subjective fevers at home. Patient is also tachycardic. Meets SIRS criteria. Will get cbc, CMP, lactate, cultures, xrays to r/o osteo. Will give vanc/zosyn empirically.   6:18 PM Glucose 300. Xray showed no osteo. Patient has failed outpatient abx. I called Dr. Lorin Mercy from ortho, who will see patient in AM. He recommend IV abx, MRI foot for possible osteo. Recommend medicine admission.    Final Clinical Impressions(s) / ED Diagnoses   Final diagnoses:  Skin ulcer of left foot with fat layer exposed [facility])    Hyperglycemia    ED Discharge Orders    None       Drenda Freeze, MD 02/21/18 1818

## 2018-02-21 NOTE — ED Notes (Signed)
ED TO INPATIENT HANDOFF REPORT  Name/Age/Gender [name] 28 y.o. female  Code Status    Code Status Orders  (From admission, onward)         Start     Ordered   02/21/18 1905  Full code  Continuous     02/21/18 1904        Code Status History    Date Active Date Inactive Code Status Order ID Comments User Context   02/21/2018 1833 02/21/2018 1904 Full Code [id number]  Ike Bene, MD ED   01/02/2016 0842 01/05/2016 1438 Full Code [id number]  Lora Paula, MD Inpatient   09/02/2015 1729 09/09/2015 2148 Full Code [id number]  Meredith Pel, NP ED      Home/SNF/Other Home  Chief Complaint wound on foot  Level of Care/Admitting Diagnosis ED Disposition    ED Disposition Condition Comment   Admit  Hospital Area: [facility] [[id number]]  Level of Care: Med-Surg [16]  Diagnosis: Foot infection [[id number]]  Admitting Physician: Ike Bene [[id number]]  Attending Physician: Ike Bene [[id number]]  Estimated length of stay: past midnight tomorrow  Certification:: I certify this patient will need inpatient services for at least 2 midnights  PT Class (Do Not Modify): Inpatient [101]  PT Acc Code (Do Not Modify): Private [1]       Medical History Past Medical History:  Diagnosis Date  . Diabetes mellitus without complication ([facility])   . Hypertension     Allergies No Known Allergies  IV Location/Drains/Wounds Patient Lines/Drains/Airways Status   Active Line/Drains/Airways    Name:   Placement date:   Placement time:   Site:   Days:   Peripheral IV 02/21/18 Left Antecubital   02/21/18    1659    Antecubital   less than 1          Labs/Imaging Results for orders placed or performed during the hospital encounter of 02/21/18 (from the past 48 hour(s))  Comprehensive metabolic panel     Status: Abnormal   Collection Time: 02/21/18  3:38 PM  Result Value Ref Range   Sodium 135 135 - 145 mmol/L   Potassium 3.9 3.5 - 5.1 mmol/L    Chloride 98 98 - 111 mmol/L   CO2 27 22 - 32 mmol/L   Glucose, Bld 329 (H) 70 - 99 mg/dL   BUN 10 6 - 20 mg/dL   Creatinine, Ser 4.01 0.44 - 1.00 mg/dL   Calcium 9.4 8.9 - 02.7 mg/dL   Total Protein 8.6 (H) 6.5 - 8.1 g/dL   Albumin 3.1 (L) 3.5 - 5.0 g/dL   AST 12 (L) 15 - 41 U/L   ALT 10 0 - 44 U/L   Alkaline Phosphatase 96 38 - 126 U/L   Total Bilirubin 0.5 0.3 - 1.2 mg/dL   GFR calc non Af Amer >60 >60 mL/min   GFR calc Af Amer >60 >60 mL/min   Anion gap 10 5 - 15    Comment: Performed at [facility], [address] [place], [place] [address]  CBC with Differential     Status: Abnormal   Collection Time: 02/21/18  3:38 PM  Result Value Ref Range   WBC 11.9 (H) 4.0 - 10.5 K/uL   RBC 4.21 3.87 - 5.11 MIL/uL   Hemoglobin 11.9 (L) 12.0 - 15.0 g/dL   HCT 44.0 34.7 - 42.5 %   MCV 87.4 80.0 - 100.0 fL   MCH 28.3 26.0 - 34.0 pg  MCHC 32.3 30.0 - 36.0 g/dL   RDW 16.112.1 09.611.5 - 04.515.5 %   Platelets 492 (H) 150 - 400 K/uL   nRBC 0.0 0.0 - 0.2 %   Neutrophils Relative % 70 %   Neutro Abs 8.2 (H) 1.7 - 7.7 K/uL   Lymphocytes Relative 21 %   Lymphs Abs 2.5 0.7 - 4.0 K/uL   Monocytes Relative 8 %   Monocytes Absolute 0.9 0.1 - 1.0 K/uL   Eosinophils Relative 1 %   Eosinophils Absolute 0.1 0.0 - 0.5 K/uL   Basophils Relative 0 %   Basophils Absolute 0.0 0.0 - 0.1 K/uL   Immature Granulocytes 0 %   Abs Immature Granulocytes 0.05 0.00 - 0.07 K/uL    Comment: Performed at [facility], [address] [place], [place] [id number]  I-Stat beta hCG blood, ED     Status: None   Collection Time: 02/21/18  4:39 PM  Result Value Ref Range   I-stat hCG, quantitative <5.0 <5 mIU/mL   Comment 3            Comment:   GEST. AGE      CONC.  (mIU/mL)   <=1 WEEK        5 - 50     2 WEEKS       50 - 500     3 WEEKS       100 - 10,000     4 WEEKS     1,000 - 30,000        FEMALE AND NON-PREGNANT FEMALE:     LESS THAN 5 mIU/mL   CG4 I-STAT (Lactic acid)      Status: None   Collection Time: 02/21/18  4:41 PM  Result Value Ref Range   Lactic Acid, Venous 1.06 0.5 - 1.9 mmol/L   Dg Chest 2 View  Result Date: 02/21/2018 CLINICAL DATA:  LEFT foot wound, blister popped, pain, former smoker, fever, tachycardia, diabetes mellitus EXAM: CHEST - 2 VIEW COMPARISON:  08/22/2016 FINDINGS: Normal heart size, mediastinal contours, and pulmonary vascularity. Lungs clear. No pleural effusion or pneumothorax. Bones unremarkable. IMPRESSION: Normal exam. Electronically Signed   By: Ulyses SouthwardMark  Boles M.D.   On: 02/21/2018 16:15   Dg Foot Complete Left  Result Date: 02/21/2018 CLINICAL DATA:  LEFT foot wound, blister popped, lot of pain, swelling, question osteomyelitis, history diabetes mellitus EXAM: LEFT FOOT - COMPLETE 3+ VIEW COMPARISON:  02/11/2018 FINDINGS: Osseous mineralization normal. Joint spaces preserved. Dressing artifacts at fifth toe. Soft tissue swelling at dorsum of LEFT foot. No acute fracture, dislocation or bone destruction. IMPRESSION: Soft tissue swelling without acute bony abnormalities. Electronically Signed   By: Ulyses SouthwardMark  Boles M.D.   On: 02/21/2018 16:17   None  Pending Labs Unresulted Labs (From admission, onward)    Start     Ordered   02/22/18 0500  Basic metabolic panel  Daily,   R     02/21/18 1839   02/22/18 0500  CBC with Differential/Platelet  Daily,   R     02/21/18 1839   02/22/18 0500  Magnesium  Daily,   R     02/21/18 1839   02/21/18 1538  Culture, blood (Routine x 2)  BLOOD CULTURE X 2,   STAT     02/21/18 1538   02/21/18 1538  Urinalysis, Routine w reflex microscopic  ONCE - STAT,   STAT     02/21/18 1538          Vitals/Pain  Today's Vitals   02/21/18 1423 02/21/18 1434 02/21/18 1944 02/21/18 1945  BP: 119/74  (!) 133/91   Pulse: (!) 126  (!) 122   Resp: 18  18   Temp: 99.4 F (37.4 C)  98.2 F (36.8 C)   TempSrc:   Oral   SpO2: 99%  100%   Weight: 81.6 kg     Height: 5\' 8"  (1.727 m)     PainSc:  8  6  6       Isolation Precautions No active isolations  Medications Medications  bacitracin ointment (has no administration in time range)  Ampicillin-Sulbactam (UNASYN) 3 g in sodium chloride 0.9 % 100 mL IVPB (has no administration in time range)  insulin glargine (LANTUS) injection 15 Units (has no administration in time range)  insulin aspart (novoLOG) injection 0-15 Units (has no administration in time range)  sodium chloride 0.9 % bolus 1,000 mL (1,000 mLs Intravenous New Bag/Given 02/21/18 1948)  morphine 2 MG/ML injection 2 mg (has no administration in time range)  acetaminophen (TYLENOL) tablet 650 mg (has no administration in time range)  oxyCODONE (Oxy IR/ROXICODONE) immediate release tablet 5 mg (has no administration in time range)  enoxaparin (LOVENOX) injection 40 mg (has no administration in time range)  sodium chloride 0.9 % bolus 1,000 mL (0 mLs Intravenous Stopped 02/21/18 1849)  piperacillin-tazobactam (ZOSYN) IVPB 3.375 g (0 g Intravenous Stopped 02/21/18 1757)  vancomycin (VANCOCIN) IVPB 1000 mg/200 mL premix ( Intravenous Restarted 02/21/18 1949)  morphine 4 MG/ML injection 4 mg (4 mg Intravenous Given 02/21/18 1710)  insulin aspart (novoLOG) injection 5 Units (5 Units Subcutaneous Given 02/21/18 1757)  gadobutrol (GADAVIST) 1 MMOL/ML injection 10 mL (8 mLs Intravenous Contrast Given 02/21/18 1923)    Mobility walks

## 2018-02-21 NOTE — ED Notes (Signed)
Pt returned from MRI °

## 2018-02-21 NOTE — ED Notes (Signed)
Patient transported to MRI 

## 2018-02-21 NOTE — ED Notes (Signed)
Pt still in MRI at this time. Will await to call report once pt returns.

## 2018-02-21 NOTE — ED Triage Notes (Signed)
Pt presents with c/o wound on her left foot. Pt reports she has had a blister that was noted several weeks ago and she has been following up with her wound doctor. Pt reports the blister has popped, she is in a lot of pain, and is unable to stand on that foot. Pt reports oozing from that area.

## 2018-02-21 NOTE — H&P (Signed)
History and Physical  Kelli Hudson FSF:[id number] DOB: 1990-12-25 DOA: 02/21/2018 1546  Referring physician: Emilee Hero PCP: Lin Landsman, MD  Outside providers: Jacqualyn Posey (podiatry)  HISTORY   Chief Complaint: L foot (5th toe) blister  HPI: Kelli Hudson is a 28 y.o. female  hx of IDDM, HTN who presents to [facility] ED for L toe ulcer. Patient had noted a blister on the L 5th distal toe for several weeks that popped about 1.5 weeks ago. She was seen by podiatry on 02/18/17 and prescribed doxycycline. Since then, she has noticed increased erythema and swelling extending from the L 5th toe to her lower shin, as well as purulent seeping drainage from the open ulcer. For past 3-4 days, she has also been experiencing subjective fevers and chills. No alleviating factors. She has also noted elevated sugars in 200-300s at home despite compliance with insulin and diet. Of note, wound cultures obtained from the ulcer on 02/18/17 is growing enterococcus faecalis.   Review of Systems:  + L foot ulcer as above + subjective fevers/chills - no cough - no chest pain, dyspnea on exertion - no edema, PND, orthopnea - no nausea/vomiting; no tarry, melanotic or bloody stools - no dysuria, increased urinary frequency - no weight changes Rest of systems reviewed are negative, except as per above history.   ED course:  Vitals Blood pressure 119/74, pulse (!) 126, temperature 99.4 F (37.4 C), resp. rate 18, height 5' 8"  (1.727 m), weight 81.6 kg, last menstrual period 01/31/2018, SpO2 99 %. Received morphine 34m IV x 1; NS bolus 1L x 1; IV vanc and zosyn x 1 doses  Past Medical History:  Diagnosis Date  . Diabetes mellitus without complication ([place]   . Hypertension    Past Surgical History:  Procedure Laterality Date  . ESOPHAGOGASTRODUODENOSCOPY (EGD) WITH PROPOFOL Left 09/08/2015   Procedure: ESOPHAGOGASTRODUODENOSCOPY (EGD) WITH PROPOFOL;  Surgeon: WArta Silence MD;  [facility];   Service: Endoscopy;  Laterality: Left;    Social History:  reports that she has quit smoking. She has never used smokeless tobacco. She reports current alcohol use of about 1.0 standard drinks of alcohol per week. She reports that she does not use drugs.  No Known Allergies  Family History  Problem Relation Age of Onset  . Lung cancer Mother   . Bipolar disorder Father       Prior to Admission medications   Medication Sig Start Date End Date Taking? Authorizing Provider  doxycycline (VIBRA-TABS) 100 MG tablet Take 1 tablet (100 mg total) by mouth 2 (two) times daily. 02/18/18  Yes WTrula Slade DPM  ibuprofen (ADVIL,MOTRIN) 200 MG tablet Take 400-600 mg by mouth every 6 (six) hours as needed for headache or moderate pain.    Yes [provider]  mupirocin ointment (BACTROBAN) 2 % Apply 1 application topically 2 (two) times daily. 02/11/18  Yes WTrula Slade DPM  Blood Glucose Monitoring Suppl (TRUE METRIX METER) w/Device KIT 1 Device by Does not apply route 4 (four) times daily. 09/10/15   NBrayton Caves PA-C  cephALEXin (KEFLEX) 500 MG capsule Take 1 capsule (500 mg total) by mouth 3 (three) times daily. Patient not taking: Reported on 02/21/2018 02/11/18   WTrula Slade DPM  glucose blood (TRUE METRIX BLOOD GLUCOSE TEST) test strip Use as instructed 09/10/15   NBrayton Caves PA-C  HYDROcodone-acetaminophen (NORCO) 5-325 MG tablet Take 1-2 tablets by mouth every 6 (six) hours as needed. Patient not taking: Reported on  02/21/2018 01/06/16   Veryl Speak, MD  insulin aspart (NOVOLOG FLEXPEN) 100 UNIT/ML FlexPen Inject 5 Units into the skin 3 (three) times daily with meals. Patient not taking: Reported on 02/21/2018 01/05/16   Burgess Estelle, MD  Insulin Detemir (LEVEMIR FLEXPEN) 100 UNIT/ML Pen Inject 15 Units into the skin daily at 10 pm. Patient not taking: Reported on 02/21/2018 01/05/16   Burgess Estelle, MD  Insulin Syringe-Needle U-100 (BD INSULIN SYRINGE  ULTRAFINE) 31G X 15/64" 0.5 ML MISC 1 each by Does not apply route 2 (two) times daily. 09/19/15   Charlott Rakes, MD  metoCLOPramide (REGLAN) 10 MG tablet Take 1 tablet (10 mg total) by mouth 4 (four) times daily -  before meals and at bedtime. Patient not taking: Reported on 02/21/2018 01/04/16   Holley Raring, MD  naproxen (NAPROSYN) 500 MG tablet Take 1 tablet (500 mg total) by mouth 2 (two) times daily. Patient not taking: Reported on 02/21/2018 08/22/16   Orpah Greek, MD  pantoprazole (PROTONIX) 40 MG tablet Take 1 tablet (40 mg total) by mouth daily. Patient not taking: Reported on 02/21/2018 08/16/16   Recardo Evangelist, PA-C  TRUEPLUS LANCETS 28G MISC assist with checking blood sugar TID and qhs 09/10/15   Brayton Caves, PA-C    PHYSICAL EXAM   Temp:  [99.4 F (37.4 C)] 99.4 F (37.4 C) (01/05 1423) Pulse Rate:  [126] 126 (01/05 1423) Resp:  [18] 18 (01/05 1423) BP: (119)/(74) 119/74 (01/05 1423) SpO2:  [99 %] 99 % (01/05 1423) Weight:  [81.6 kg] 81.6 kg (01/05 1423)  BP 119/74 (BP Location: Right Arm)   Pulse (!) 126   Temp 99.4 F (37.4 C)   Resp 18   Ht 5' 8"  (1.727 m)   Wt 81.6 kg   LMP 01/31/2018 (Approximate)   SpO2 99%   BMI 27.37 kg/m    GEN well-nourished young african-american female; resting in bed, not in acute distress, occasionally wincing and uncomfortable due to L foot pain  HEENT NCAT EOM intact PERRL; clear oropharynx, no cervical LAD; dry mucus membranes  JVP estimated 5 cm H2O above RA; no HJR ; no carotid bruits b/l ;  CV regular tachycardic; normal S1 and S2; no m/r/g or S3/S4; PMI non displaced; no parasternal heave  RESP CTA b/l; breathing unlabored and symmetric  ABD soft NT ND +normoactive BS  EXT warm throughout b/l; no pitting edema on RLE; non pitting edema over L foot (see below)  PULSES  DP and radials 2+ intact b/l  SKIN/MSK  Circular ~1cm diameter shallow wound lesions over R shin, pink wound bed, no discharge Ulcerated open  wound over L 5th distal toe with surrounding erythema, not actively draining but pus and underlying bone visible; erythema and swelling extending to lower 1/3 of tibia DP is intact over the L foot. Fluid filled (intact) blister over L foot dorsum Overall the foot is very warm to touch. Sensation intact over the L foot and toes. ROM (toe flexion) is limited due to swelling. L calf is soft.  Other healed / scabbed over lesions over L anterior shin   NEURO/PSYCH AAOx4; no focal deficits   DATA   LABS ON ADMISSION:  Basic Metabolic Panel: Recent Labs  Lab 02/21/18 1538  NA 135  K 3.9  CL 98  CO2 27  GLUCOSE 329*  BUN 10  CREATININE 0.78  CALCIUM 9.4   CBC: Recent Labs  Lab 02/21/18 1538  WBC 11.9*  NEUTROABS 8.2*  HGB  11.9*  HCT 36.8  MCV 87.4  PLT 492*   Liver Function Tests: Recent Labs  Lab 02/21/18 1538  AST 12*  ALT 10  ALKPHOS 96  BILITOT 0.5  PROT 8.6*  ALBUMIN 3.1*   No results for input(s): LIPASE, AMYLASE in the last 168 hours. No results for input(s): AMMONIA in the last 168 hours. Coagulation:  No results found for: INR, PROTIME No results found for: PTT Lactic Acid, Venous:     Component Value Date/Time   LATICACIDVEN 1.06 02/21/2018 1641   Cardiac Enzymes: No results for input(s): CKTOTAL, CKMB, CKMBINDEX, TROPONINI in the last 168 hours. Urinalysis:    Component Value Date/Time   COLORURINE STRAW (A) 08/16/2016 0439   APPEARANCEUR CLEAR 08/16/2016 0439   LABSPEC 1.027 08/16/2016 0439   PHURINE 5.0 08/16/2016 0439   GLUCOSEU >=500 (A) 08/16/2016 0439   HGBUR SMALL (A) 08/16/2016 0439   BILIRUBINUR NEGATIVE 08/16/2016 0439   KETONESUR NEGATIVE 08/16/2016 0439   PROTEINUR NEGATIVE 08/16/2016 0439   UROBILINOGEN 0.2 06/15/2012 1957   NITRITE NEGATIVE 08/16/2016 0439   LEUKOCYTESUR TRACE (A) 08/16/2016 0439    BNP (last 3 results) No results for input(s): PROBNP in the last 8760 hours. CBG: No results for input(s): GLUCAP in the last  168 hours.  Radiological Exams on Admission: Dg Chest 2 View  Result Date: 02/21/2018 CLINICAL DATA:  LEFT foot wound, blister popped, pain, former smoker, fever, tachycardia, diabetes mellitus EXAM: CHEST - 2 VIEW COMPARISON:  08/22/2016 FINDINGS: Normal heart size, mediastinal contours, and pulmonary vascularity. Lungs clear. No pleural effusion or pneumothorax. Bones unremarkable. IMPRESSION: Normal exam. Electronically Signed   By: Lavonia Dana M.D.   On: 02/21/2018 16:15   Dg Foot Complete Left  Result Date: 02/21/2018 CLINICAL DATA:  LEFT foot wound, blister popped, lot of pain, swelling, question osteomyelitis, history diabetes mellitus EXAM: LEFT FOOT - COMPLETE 3+ VIEW COMPARISON:  02/11/2018 FINDINGS: Osseous mineralization normal. Joint spaces preserved. Dressing artifacts at fifth toe. Soft tissue swelling at dorsum of LEFT foot. No acute fracture, dislocation or bone destruction. IMPRESSION: Soft tissue swelling without acute bony abnormalities. Electronically Signed   By: Lavonia Dana M.D.   On: 02/21/2018 16:17    EKG: not obtained; admission EKG pending  I have reviewed the patient's previous electronic chart records, labs, and other data.   ASSESSMENT AND PLAN   Assessment: Kelli Hudson is a 28 y.o. female with hx of IDDM, HTN who presents with L diabetic foot ulcer on L 5th distal toe -- open, ulcerated and concerning on examination for osteomyelitis. Initial plain films only show soft tissue swelling. MRI of the L foot pending, as per ortho, which may dictate next management steps (ie debridement/amputation). Wound culture obtained at podiatry office on 02/18/17 did show +E faecalis (susceptible to ampicillin and vancomycin). Mild systemic sx of infection, mainly tachycardia, but BP stable on arrival.    Active Problems:   Diabetic foot ulcer ([place])   Plan:   # L 5th distal toe diabetic ulcer. Concerning for osteomyelitis.  > +e faecalis on 02/18/18 wound culture - s/p  vanc and zosyn in ED 02/21/18 - starting ampicillin/sublactam 02/21/18 at 3gm q6h  - additional fluid resusc with NS bolus - appreciate orthopedics recs - MRI of L foot pending - keep L lower extremity elevated - blood cultures pending - pain control: prn tylenol and oxycodone 3m with morphine 242mfor breakthrough - diabetes control as below   # IDDM - hyperglycemic to 300s on  admission. AG at 96. Likely reactive due to infection.  > at home on lantus 15 units and mealtime sliding scale ~ 10 novolog - resume lantus 15 units - medium sliding scale AC when patient is eating - fingersticks ACHS and q6h when NPO  # HTN - currently normotensive. Not on medications - monitor for now, can address as outpatient   # Mild normocytic anemia Hb 11, similar to prior levels. Suspect inflammatory vs mixed  - iron panel likely not informative given active infection  - monitor with daily CBC and follow up as outpatient.   DVT Prophylaxis: lovenox  Code Status:  Full Code Family Communication: patient and wife at bedside  Disposition Plan: admit to med floor; ortho eval pending   Patient contact: Extended Emergency Contact Information Primary Emergency Contact: Lucilla [place] Phone: [phone] Relation: Significant other  Time spent: > 35 mins  Colbert Ewing, MD [facility] Pager [phone]  If 7PM-7AM, please contact night-coverage [email or web address] Password [email or web address] 02/21/2018, 7:09 PM

## 2018-02-22 DIAGNOSIS — E08621 Diabetes mellitus due to underlying condition with foot ulcer: Secondary | ICD-10-CM

## 2018-02-22 DIAGNOSIS — L97522 Non-pressure chronic ulcer of other part of left foot with fat layer exposed: Secondary | ICD-10-CM

## 2018-02-22 DIAGNOSIS — L97401 Non-pressure chronic ulcer of unspecified heel and midfoot limited to breakdown of skin: Secondary | ICD-10-CM

## 2018-02-22 LAB — CBC WITH DIFFERENTIAL/PLATELET
Abs Immature Granulocytes: 0.05 10*3/uL (ref 0.00–0.07)
Basophils Absolute: 0 10*3/uL (ref 0.0–0.1)
Basophils Relative: 0 %
Eosinophils Absolute: 0.1 10*3/uL (ref 0.0–0.5)
Eosinophils Relative: 1 %
HCT: 31 % — ABNORMAL LOW (ref 36.0–46.0)
Hemoglobin: 10.1 g/dL — ABNORMAL LOW (ref 12.0–15.0)
Immature Granulocytes: 1 %
Lymphocytes Relative: 25 %
Lymphs Abs: 2.8 10*3/uL (ref 0.7–4.0)
MCH: 28.2 pg (ref 26.0–34.0)
MCHC: 32.6 g/dL (ref 30.0–36.0)
MCV: 86.6 fL (ref 80.0–100.0)
MONO ABS: 1 10*3/uL (ref 0.1–1.0)
Monocytes Relative: 9 %
NEUTROS ABS: 7.1 10*3/uL (ref 1.7–7.7)
Neutrophils Relative %: 64 %
Platelets: 435 10*3/uL — ABNORMAL HIGH (ref 150–400)
RBC: 3.58 MIL/uL — ABNORMAL LOW (ref 3.87–5.11)
RDW: 12.1 % (ref 11.5–15.5)
WBC: 11.1 10*3/uL — ABNORMAL HIGH (ref 4.0–10.5)
nRBC: 0 % (ref 0.0–0.2)

## 2018-02-22 LAB — BASIC METABOLIC PANEL
Anion gap: 9 (ref 5–15)
BUN: 11 mg/dL (ref 6–20)
CO2: 25 mmol/L (ref 22–32)
Calcium: 8.4 mg/dL — ABNORMAL LOW (ref 8.9–10.3)
Chloride: 104 mmol/L (ref 98–111)
Creatinine, Ser: 0.63 mg/dL (ref 0.44–1.00)
GFR calc Af Amer: >60 mL/min (ref >60)
Glucose, Bld: 276 mg/dL — ABNORMAL HIGH (ref 70–99)
Potassium: 3.8 mmol/L (ref 3.5–5.1)
Sodium: 138 mmol/L (ref 135–145)

## 2018-02-22 LAB — GLUCOSE, CAPILLARY
GLUCOSE-CAPILLARY: 169 mg/dL — AB (ref 70–99)
Glucose-Capillary: 240 mg/dL — ABNORMAL HIGH (ref 70–99)
Glucose-Capillary: 227 mg/dL — ABNORMAL HIGH (ref 70–99)
Glucose-Capillary: 181 mg/dL — ABNORMAL HIGH (ref 70–99)
Glucose-Capillary: 297 mg/dL — ABNORMAL HIGH (ref 70–99)

## 2018-02-22 LAB — HEMOGLOBIN A1C
Hgb A1c MFr Bld: 13.8 % — ABNORMAL HIGH (ref 4.8–5.6)
Mean Plasma Glucose: 349.36 mg/dL

## 2018-02-22 LAB — URINALYSIS, ROUTINE W REFLEX MICROSCOPIC
BILIRUBIN URINE: NEGATIVE
Hgb urine dipstick: NEGATIVE
KETONES UR: 20 mg/dL — AB
Leukocytes, UA: NEGATIVE
Nitrite: NEGATIVE
PH: 5 (ref 5.0–8.0)
PROTEIN: NEGATIVE mg/dL
Specific Gravity, Urine: 1.026 (ref 1.005–1.030)

## 2018-02-22 LAB — MAGNESIUM: Magnesium: 1.5 mg/dL — ABNORMAL LOW (ref 1.7–2.4)

## 2018-02-22 MED ORDER — MAGNESIUM SULFATE 2 GM/50ML IV SOLN
2.0000 g | Freq: Once | INTRAVENOUS | Status: AC
  Administered 2018-02-22: 2 g via INTRAVENOUS
  Filled 2018-02-22: qty 50

## 2018-02-22 NOTE — Consult Note (Addendum)
Reason for Consult: Insulin-dependent diabetic with left foot infection and cellulitis. Referring Physician: Rickey BarbaraStephen Chiu MD  [name] is an 28 y.o. female.  HPI: 28 year old female insulin-dependent diabetic since 2008.  She states she takes Lantus 15 units and uses a sliding scale but does not very frequently check her sugar.  Hemoglobin A1c 2 years ago was 14 and also 9.7.  She states she recently had to change providers and needs to get a new doctor for diabetes.  Patient is married and she states her partner is pregnant with their first child.  Patient works at [employer] and is on her feet.  She is been wearing regular tennis shoes and noted a blister over her left dorsal fifth toe which gradually progressed with increased drainage and erythema.  White count on admission was 11,900 with left shift.  Patient's been placed on vancomycin and Zosyn currently.  MRI scan was obtained today.  Radiographs emergency room showed no obvious osteomyelitis.  Previously seen by Dr. Lajoyce Hudson with Kelli Hudson grade 1 ulcer underneath the right foot third and fourth metatarsal heads.  She saw Kelli Hudson 4 days ago and culture was obtained which grew Enterobacter faecalis sensitive to ampicillin and vancomycin.  Past Medical History:  Diagnosis Date  . Diabetes mellitus without complication (H[facility])   . Hypertension     Past Surgical History:  Procedure Laterality Date  . ESOPHAGOGASTRODUODENOSCOPY (EGD) WITH PROPOFOL Left 09/08/2015   Procedure: ESOPHAGOGASTRODUODENOSCOPY (EGD) WITH PROPOFOL;  Surgeon: Willis ModenaWilliam Outlaw, MD;  [name];  Service: Endoscopy;  Laterality: Left;    Family History  Problem Relation Age of Onset  . Lung cancer Mother   . Bipolar disorder Father     Social History:  reports that she has quit smoking. She has never used smokeless tobacco. She reports current alcohol use of about 1.0 standard drinks of alcohol per week. She reports that she does not use  drugs.  Allergies: No Known Allergies  Medications: I have reviewed the patient's current medications.  Results for orders placed or performed during the hospital encounter of 02/21/18 (from the past 48 hour(s))  Comprehensive metabolic panel     Status: Abnormal   Collection Time: 02/21/18  3:38 PM  Result Value Ref Range   Sodium 135 135 - 145 mmol/L   Potassium 3.9 3.5 - 5.1 mmol/L   Chloride 98 98 - 111 mmol/L   CO2 27 22 - 32 mmol/L   Glucose, Bld 329 (H) 70 - 99 mg/dL   BUN 10 6 - 20 mg/dL   Creatinine, Ser 8.650.78 0.44 - 1.00 mg/dL   Calcium 9.4 8.9 - 78.410.3 mg/dL   Total Protein 8.6 (H) 6.5 - 8.1 g/dL   Albumin 3.1 (L) 3.5 - 5.0 g/dL   AST 12 (L) 15 - 41 U/L   ALT 10 0 - 44 U/L   Alkaline Phosphatase 96 38 - 126 U/L   Total Bilirubin 0.5 0.3 - 1.2 mg/dL   GFR calc non Af Amer >60 >60 mL/min   GFR calc Af Amer >60 >60 mL/min   Anion gap 10 5 - 15    Comment: Performed at [facility], [address] [place], [place] [address]  CBC with Differential     Status: Abnormal   Collection Time: 02/21/18  3:38 PM  Result Value Ref Range   WBC 11.9 (H) 4.0 - 10.5 K/uL   RBC 4.21 3.87 - 5.11 MIL/uL   Hemoglobin 11.9 (L) 12.0 - 15.0 g/dL  HCT 36.8 36.0 - 46.0 %   MCV 87.4 80.0 - 100.0 fL   MCH 28.3 26.0 - 34.0 pg   MCHC 32.3 30.0 - 36.0 g/dL   RDW 96.2 95.2 - 84.1 %   Platelets 492 (H) 150 - 400 K/uL   nRBC 0.0 0.0 - 0.2 %   Neutrophils Relative % 70 %   Neutro Abs 8.2 (H) 1.7 - 7.7 K/uL   Lymphocytes Relative 21 %   Lymphs Abs 2.5 0.7 - 4.0 K/uL   Monocytes Relative 8 %   Monocytes Absolute 0.9 0.1 - 1.0 K/uL   Eosinophils Relative 1 %   Eosinophils Absolute 0.1 0.0 - 0.5 K/uL   Basophils Relative 0 %   Basophils Absolute 0.0 0.0 - 0.1 K/uL   Immature Granulocytes 0 %   Abs Immature Granulocytes 0.05 0.00 - 0.07 K/uL    Comment: Performed at [facility], [address] [place], [place] [address]  I-Stat beta hCG blood, ED      Status: None   Collection Time: 02/21/18  4:39 PM  Result Value Ref Range   I-stat hCG, quantitative <5.0 <5 mIU/mL   Comment 3            Comment:   GEST. AGE      CONC.  (mIU/mL)   <=1 WEEK        5 - 50     2 WEEKS       50 - 500     3 WEEKS       100 - 10,000     4 WEEKS     1,000 - 30,000        FEMALE AND NON-PREGNANT FEMALE:     LESS THAN 5 mIU/mL   CG4 I-STAT (Lactic acid)     Status: None   Collection Time: 02/21/18  4:41 PM  Result Value Ref Range   Lactic Acid, Venous 1.06 0.5 - 1.9 mmol/L  Glucose, capillary     Status: Abnormal   Collection Time: 02/21/18  8:50 PM  Result Value Ref Range   Glucose-Capillary 195 (H) 70 - 99 mg/dL  Urinalysis, Routine w reflex microscopic     Status: Abnormal   Collection Time: 02/22/18  1:46 AM  Result Value Ref Range   Color, Urine YELLOW YELLOW   APPearance CLEAR CLEAR   Specific Gravity, Urine 1.026 1.005 - 1.030   pH 5.0 5.0 - 8.0   Glucose, UA >=500 (A) NEGATIVE mg/dL   Hgb urine dipstick NEGATIVE NEGATIVE   Bilirubin Urine NEGATIVE NEGATIVE   Ketones, ur 20 (A) NEGATIVE mg/dL   Protein, ur NEGATIVE NEGATIVE mg/dL   Nitrite NEGATIVE NEGATIVE   Leukocytes, UA NEGATIVE NEGATIVE   RBC / HPF 0-5 0 - 5 RBC/hpf   WBC, UA 6-10 0 - 5 WBC/hpf   Bacteria, UA RARE (A) NONE SEEN   Squamous Epithelial / LPF 0-5 0 - 5   Mucus PRESENT     Comment: Performed at [facility], [address] [place], [place] [address]  Basic metabolic panel     Status: Abnormal   Collection Time: 02/22/18  4:11 AM  Result Value Ref Range   Sodium 138 135 - 145 mmol/L   Potassium 3.8 3.5 - 5.1 mmol/L   Chloride 104 98 - 111 mmol/L   CO2 25 22 - 32 mmol/L   Glucose, Bld 276 (H) 70 - 99 mg/dL   BUN 11 6 - 20 mg/dL  Creatinine, Ser 0.63 0.44 - 1.00 mg/dL   Calcium 8.4 (L) 8.9 - 10.3 mg/dL   GFR calc non Af Amer >60 >60 mL/min   GFR calc Af Amer >60 >60 mL/min   Anion gap 9 5 - 15    Comment: Performed at [facility], [address] [place], [place] [address]  CBC with Differential/Platelet     Status: Abnormal   Collection Time: 02/22/18  4:11 AM  Result Value Ref Range   WBC 11.1 (H) 4.0 - 10.5 K/uL   RBC 3.58 (L) 3.87 - 5.11 MIL/uL   Hemoglobin 10.1 (L) 12.0 - 15.0 g/dL   HCT 46.931.0 (L) 62.936.0 - 52.846.0 %   MCV 86.6 80.0 - 100.0 fL   MCH 28.2 26.0 - 34.0 pg   MCHC 32.6 30.0 - 36.0 g/dL   RDW 41.312.1 24.411.5 - 01.015.5 %   Platelets 435 (H) 150 - 400 K/uL   nRBC 0.0 0.0 - 0.2 %   Neutrophils Relative % 64 %   Neutro Abs 7.1 1.7 - 7.7 K/uL   Lymphocytes Relative 25 %   Lymphs Abs 2.8 0.7 - 4.0 K/uL   Monocytes Relative 9 %   Monocytes Absolute 1.0 0.1 - 1.0 K/uL   Eosinophils Relative 1 %   Eosinophils Absolute 0.1 0.0 - 0.5 K/uL   Basophils Relative 0 %   Basophils Absolute 0.0 0.0 - 0.1 K/uL   Immature Granulocytes 1 %   Abs Immature Granulocytes 0.05 0.00 - 0.07 K/uL    Comment: Performed at [facility], [address] [place], [place] [address]  Magnesium     Status: Abnormal   Collection Time: 02/22/18  4:11 AM  Result Value Ref Range   Magnesium 1.5 (L) 1.7 - 2.4 mg/dL    Comment: Performed at [facility], [address] [place], [place] [address]  Glucose, capillary     Status: Abnormal   Collection Time: 02/22/18  5:46 AM  Result Value Ref Range   Glucose-Capillary 240 (H) 70 - 99 mg/dL  Glucose, capillary     Status: Abnormal   Collection Time: 02/22/18  7:33 AM  Result Value Ref Range   Glucose-Capillary 227 (H) 70 - 99 mg/dL    Dg Chest 2 View  Result Date: 02/21/2018 CLINICAL DATA:  LEFT foot wound, blister popped, pain, former smoker, fever, tachycardia, diabetes mellitus EXAM: CHEST - 2 VIEW COMPARISON:  08/22/2016 FINDINGS: Normal heart size, mediastinal contours, and pulmonary vascularity. Lungs clear. No pleural effusion or pneumothorax. Bones unremarkable. IMPRESSION: Normal exam. Electronically Signed   By: Ulyses SouthwardMark  Boles M.D.   On:  02/21/2018 16:15   Mr Foot Left W Wo Contrast  Result Date: 02/21/2018 CLINICAL DATA:  Blister on left foot for a few months. History of diabetes. EXAM: MRI OF THE LEFT FOREFOOT WITHOUT AND WITH CONTRAST TECHNIQUE: Multiplanar, multisequence MR imaging of the left forefoot was performed both before and after administration of intravenous contrast. CONTRAST:  8 cc IV Gadavist COMPARISON:  02/21/2018 foot radiographs FINDINGS: Bones/Joint/Cartilage Marrow signal abnormality of the fifth toe starting from the fifth metatarsal head and neck. Associated soft tissue swelling and cellulitis is identified. No acute fracture, bone destruction or suspicious osseous lesions. Ligaments Noncontributory Muscles and Tendons Evidence of myositis or pyomyositis. Soft tissues Dorsal soft tissue swelling of the fifth digit with slight cutaneous irregularity that may correspond with the history of a blister. IMPRESSION: Cellulitis of the left fifth toe with marrow signal  abnormalities raising concern for osteomyelitis of the fifth toe starting from the level of the fifth metatarsal neck and head. Electronically Signed   By: Tollie Eth M.D.   On: 02/21/2018 19:50   Dg Foot Complete Left  Result Date: 02/21/2018 CLINICAL DATA:  LEFT foot wound, blister popped, lot of pain, swelling, question osteomyelitis, history diabetes mellitus EXAM: LEFT FOOT - COMPLETE 3+ VIEW COMPARISON:  02/11/2018 FINDINGS: Osseous mineralization normal. Joint spaces preserved. Dressing artifacts at fifth toe. Soft tissue swelling at dorsum of LEFT foot. No acute fracture, dislocation or bone destruction. IMPRESSION: Soft tissue swelling without acute bony abnormalities. Electronically Signed   By: Ulyses Southward M.D.   On: 02/21/2018 16:17    ROS of systems positive for diabetic ketoacidosis type 2 diabetes on insulin.  Hypertension.  UTI thyromegaly.  Poorly controlled diabetes.  Otherwise negative as pertains HPI. Blood pressure 112/74, pulse (!) 108,  temperature 99 F (37.2 C), temperature source Oral, resp. rate 17, height 5\' 8"  (1.727 m), weight 81.6 kg, last menstrual period 01/31/2018, SpO2 99 %. Physical Exam  Constitutional: She is oriented to person, place, and time. She appears well-developed and well-nourished.  HENT:  Head: Normocephalic.  Eyes: Pupils are equal, round, and reactive to light.  Neck: Normal range of motion. Neck supple. No tracheal deviation present.  Cardiovascular: Normal rate.  Respiratory: Effort normal. No respiratory distress. She has no wheezes.  GI: She exhibits no distension.  Neurological: She is alert and oriented to person, place, and time.  Skin:  Left foot open blister with cellulitis forefoot lateral foot left.  Pedal pulses are palpable.  Decreased sensation consistent with peripheral neuropathy bilateral.  No plantar foot lesions left foot.  She does have tenderness with palpation over the forefoot.  Assessment/Plan: Left foot diabetic infection with MRI suggestive but not diagnostic of osteomyelitis.  No purulent areas to drain.  She does have cellulitis and is on appropriate antibiotics.  No urgent surgery at this time.  She understands that if she does not do a better job with diabetic control she is risk for amputation.  Carb modified diet ordered and diabetic nurse instruction ordered.  We spent time discussing the importance of diabetic control particularly with a child on the way with her partner.  She can follow-up with Dr. Lajoyce Corners in 2 weeks in the office.  Thank you for the opportunity to see her in consultation.  My cell phone [phone]  Eldred Manges 02/22/2018, 11:13 AM

## 2018-02-22 NOTE — Progress Notes (Signed)
Subjective: 28 year old female presents the office today for follow-up evaluation of a wound to the left fifth toe.  She states that it does not have the odor like it did it but is still draining some clear drainage but denies any pus.  She is also noticed the skin has been peeling to the toe.  She has still having swelling to the foot and leg but denies any pain to the leg.  She is continue to work.  Overall she feels well and she denies any systemic complaints such as fevers, chills, nausea, vomiting. No acute changes since last appointment, and no other complaints at this time.   Objective: AAO x3, NAD DP/PT pulses palpable bilaterally, CRT less than 3 seconds Ulceration continues to the dorsal lateral aspect left fifth toe at the level of the PIPJ.  There is clear drainage expressed of the but there is no frank purulence.  There is edema to the toe.  There is continuation of edema to the left foot and ankle but the swelling overall is slightly improved.  Mild increase in warmth of the left foot compared to the contralateral extremity.  No other open lesions identified. No open lesions or pre-ulcerative lesions.  No pain with calf compression, swelling, warmth, erythema  Assessment: Left fifth toe ulceration  Plan: -All treatment options discussed with the patient including all alternatives, risks, complications.  -Patient debrided the wound without any complications.  I also took a wound culture.  Plan to change antibiotics and will start doxycycline.  Continue with surgical shoe.  Elevation.  Discussed that there is any worsening of infection she is to go immediately to the emergency room. -Patient encouraged to call the office with any questions, concerns, change in symptoms.   *X-ray next appointment  Ovid Curd, DPM

## 2018-02-22 NOTE — Progress Notes (Signed)
Inpatient Diabetes Program Recommendations  AACE/ADA: New Consensus Statement on Inpatient Glycemic Control (2015)  Target Ranges:  Prepandial:   less than 140 mg/dL      Peak postprandial:   less than 180 mg/dL (1-2 hours)      Critically ill patients:  140 - 180 mg/dL   Lab Results  Component Value Date   GLUCAP 181 (H) 02/22/2018   HGBA1C 13.8 (H) 02/22/2018    Review of Glycemic Control  Diabetes history: DM1 Outpatient Diabetes medications: Levemir 15 units QHS, Novolog 5 units tidwc Current orders for Inpatient glycemic control: Lantus 15 units QHS, Novolog 0-15 units tidwc + 5 units tidwc  HgbA1C - 13.6% Pt has not been taking insulins as prescribed lately. Long discussion with pt regarding importance of tight glucose control for healing and to prevent long-term complications. Pt tried to make appt with Dr. Sharl Ma (endo) and the office needed a referral from her PCP. Pt needs to contact PCP and request referral. Pt has been unmotivated recently to take control of her diabetes. Discussed checking blood sugars at least 4x/day and eating healthy diet, exercise and f/u with endo. Pt and family present and stated they have encouraged her to take insulin and check blood sugars.   Will need prescription for Lantus/Levemir and Novolog pens. Has glucose meter and supplies. Also has insulin pen needles.  Inpatient Diabetes Program Recommendations:     Increase Lantus to 18 units QHS Add HS correction  Will f/u with pt in am prior to discharge.  Thank you. Ailene Ards, RD, LDN, CDE Inpatient Diabetes Coordinator [phone]

## 2018-02-22 NOTE — Progress Notes (Signed)
PROGRESS NOTE    Kelli Hudson  GBE:[id number] DOB: 1990-12-19 DOA: 02/21/2018 PCP: Leilani Able, MD    Brief Narrative:  28 y.o. female  hx of IDDM, HTN who presents to [facility] ED for L toe ulcer. Patient had noted a blister on the L 5th distal toe for several weeks that popped about 1.5 weeks ago. She was seen by podiatry on 02/18/17 and prescribed doxycycline. Since then, she has noticed increased erythema and swelling extending from the L 5th toe to her lower shin, as well as purulent seeping drainage from the open ulcer. For past 3-4 days, she has also been experiencing subjective fevers and chills. No alleviating factors. She has also noted elevated sugars in 200-300s at home despite compliance with insulin and diet. Of note, wound cultures obtained from the ulcer on 02/18/17 is growing enterococcus faecalis.   Assessment & Plan:   Active Problems:   Skin ulcer of left foot with fat layer exposed (HCC)   Diabetic foot ulcer (HCC)  # L 5th distal toe diabetic ulcer. Initially concerning for osteomyelitis.  -+e faecalis on 02/18/18 wound culture - s/p vanc and zosyn in ED 02/21/18 - cont on ampicillin/sublactam - appreciate orthopedics recs. rec to cont abx. Per ortho, follow up with Dr Lajoyce Corners in 2 weeks - MRI of L foot pending  - blood cultures pending - diabetes control as below    # IDDM - hyperglycemic to 300s on admission. AG at 10. Likely reactive due to infection.  -at home on lantus 15 units and mealtime sliding scale ~ 10 novolog - resume lantus 15 units - medium sliding scale AC when patient is eating - glucose stable  # HTN  - currently normotensive. Not on medications - monitor for now, can address as outpatient - bp stable at this time  # Mild normocytic anemia Hb 11, similar to prior levels. Suspect inflammatory vs mixed  - monitor with daily CBC and follow up as outpatient.  -hemodynamically stable  DVT prophylaxis: lovenox subq Code Status: full Family  Communication: pt in room Disposition Plan: possible dc home in 24 hrs  Consultants:   Orthopedic surgery  Procedures:     Antimicrobials: Anti-infectives (From admission, onward)   Start     Dose/Rate Route Frequency Ordered Stop   02/21/18 2300  Ampicillin-Sulbactam (UNASYN) 3 g in sodium chloride 0.9 % 100 mL IVPB     3 g 200 mL/hr over 30 Minutes Intravenous Every 6 hours 02/21/18 1838     02/21/18 2200  ampicillin (OMNIPEN) 2 g in sodium chloride 0.9 % 100 mL IVPB  Status:  Discontinued     2 g 300 mL/hr over 20 Minutes Intravenous Every 8 hours 02/21/18 1836 02/21/18 1838   02/21/18 1630  piperacillin-tazobactam (ZOSYN) IVPB 3.375 g     3.375 g 100 mL/hr over 30 Minutes Intravenous  Once 02/21/18 1623 02/21/18 1757   02/21/18 1630  vancomycin (VANCOCIN) IVPB 1000 mg/200 mL premix     1,000 mg 200 mL/hr over 60 Minutes Intravenous  Once 02/21/18 1623 02/21/18 1858       Subjective: Without sob. Wound still draining  Objective: Vitals:   02/21/18 1944 02/21/18 2049 02/21/18 2216 02/22/18 0549  BP: (!) 133/91 (!) 150/103 119/86 112/74  Pulse: (!) 122 (!) 120 (!) 105 (!) 108  Resp: 18 16  17   Temp: 98.2 F (36.8 C)   99 F (37.2 C)  TempSrc: Oral   Oral  SpO2: 100% 100%  99%  Weight:  Height:        Intake/Output Summary (Last 24 hours) at 02/22/2018 1416 Last data filed at 02/22/2018 1300 Gross per 24 hour  Intake 3519.87 ml  Output 750 ml  Net 2769.87 ml   Filed Weights   02/21/18 1423  Weight: 81.6 kg    Examination:  General exam: Appears calm and comfortable  Respiratory system: Clear to auscultation. Respiratory effort normal. Cardiovascular system: S1 & S2 heard, RRR. Gastrointestinal system: Abdomen is nondistended, soft and nontender. No organomegaly or masses felt. Normal bowel sounds heard. Central nervous system: Alert and oriented. No focal neurological deficits. Extremities: Symmetric 5 x 5 power. Skin: No rashes, L5th toe  ulcer Psychiatry: Judgement and insight appear normal. Mood & affect appropriate.   Data Reviewed: I have personally reviewed following labs and imaging studies  CBC: Recent Labs  Lab 02/21/18 1538 02/22/18 0411  WBC 11.9* 11.1*  NEUTROABS 8.2* 7.1  HGB 11.9* 10.1*  HCT 36.8 31.0*  MCV 87.4 86.6  PLT 492* 435*   Basic Metabolic Panel: Recent Labs  Lab 02/21/18 1538 02/22/18 0411  NA 135 138  K 3.9 3.8  CL 98 104  CO2 27 25  GLUCOSE 329* 276*  BUN 10 11  CREATININE 0.78 0.63  CALCIUM 9.4 8.4*  MG  --  1.5*   GFR: Estimated Creatinine Clearance: 119.4 mL/min (by C-G formula based on SCr of 0.63 mg/dL). Liver Function Tests: Recent Labs  Lab 02/21/18 1538  AST 12*  ALT 10  ALKPHOS 96  BILITOT 0.5  PROT 8.6*  ALBUMIN 3.1*   No results for input(s): LIPASE, AMYLASE in the last 168 hours. No results for input(s): AMMONIA in the last 168 hours. Coagulation Profile: No results for input(s): INR, PROTIME in the last 168 hours. Cardiac Enzymes: No results for input(s): CKTOTAL, CKMB, CKMBINDEX, TROPONINI in the last 168 hours. BNP (last 3 results) No results for input(s): PROBNP in the last 8760 hours. HbA1C: Recent Labs    02/22/18 0411  HGBA1C 13.8*   CBG: Recent Labs  Lab 02/21/18 2050 02/22/18 0546 02/22/18 0733 02/22/18 1241  GLUCAP 195* 240* 227* 169*   Lipid Profile: No results for input(s): CHOL, HDL, LDLCALC, TRIG, CHOLHDL, LDLDIRECT in the last 72 hours. Thyroid Function Tests: No results for input(s): TSH, T4TOTAL, FREET4, T3FREE, THYROIDAB in the last 72 hours. Anemia Panel: No results for input(s): VITAMINB12, FOLATE, FERRITIN, TIBC, IRON, RETICCTPCT in the last 72 hours. Sepsis Labs: Recent Labs  Lab 02/21/18 1641  LATICACIDVEN 1.06    Recent Results (from the past 240 hour(s))  WOUND CULTURE     Status: Abnormal   Collection Time: 02/18/18  4:29 PM  Result Value Ref Range Status   MICRO NUMBER: [id number]  Final   SPECIMEN  QUALITY: Adequate  Final   SOURCE: NOT GIVEN  Final   STATUS: FINAL  Final   GRAM STAIN: No organisms or white blood cells seen  Final   ISOLATE 1: Enterococcus faecalis (A)  Final    Comment: Moderate growth of Enterococcus faecalis      Susceptibility   Enterococcus faecalis - AEROBIC CULT, GRAM STAIN POSITIVE 1    AMPICILLIN <=2 Sensitive     VANCOMYCIN* 2 Sensitive      * Legend:S = Susceptible  I = IntermediateR = Resistant  NS = Not susceptible* = Not tested  NR = Not reported**NN = See antimicrobic comments     Radiology Studies: Dg Chest 2 View  Result Date: 02/21/2018 CLINICAL DATA:  LEFT foot wound, blister popped, pain, former smoker, fever, tachycardia, diabetes mellitus EXAM: CHEST - 2 VIEW COMPARISON:  08/22/2016 FINDINGS: Normal heart size, mediastinal contours, and pulmonary vascularity. Lungs clear. No pleural effusion or pneumothorax. Bones unremarkable. IMPRESSION: Normal exam. Electronically Signed   By: Ulyses SouthwardMark  Boles M.D.   On: 02/21/2018 16:15   Mr Foot Left W Wo Contrast  Result Date: 02/21/2018 CLINICAL DATA:  Blister on left foot for a few months. History of diabetes. EXAM: MRI OF THE LEFT FOREFOOT WITHOUT AND WITH CONTRAST TECHNIQUE: Multiplanar, multisequence MR imaging of the left forefoot was performed both before and after administration of intravenous contrast. CONTRAST:  8 cc IV Gadavist COMPARISON:  02/21/2018 foot radiographs FINDINGS: Bones/Joint/Cartilage Marrow signal abnormality of the fifth toe starting from the fifth metatarsal head and neck. Associated soft tissue swelling and cellulitis is identified. No acute fracture, bone destruction or suspicious osseous lesions. Ligaments Noncontributory Muscles and Tendons Evidence of myositis or pyomyositis. Soft tissues Dorsal soft tissue swelling of the fifth digit with slight cutaneous irregularity that may correspond with the history of a blister. IMPRESSION: Cellulitis of the left fifth toe with marrow signal  abnormalities raising concern for osteomyelitis of the fifth toe starting from the level of the fifth metatarsal neck and head. Electronically Signed   By: Tollie Ethavid  Kwon M.D.   On: 02/21/2018 19:50   Dg Foot Complete Left  Result Date: 02/21/2018 CLINICAL DATA:  LEFT foot wound, blister popped, lot of pain, swelling, question osteomyelitis, history diabetes mellitus EXAM: LEFT FOOT - COMPLETE 3+ VIEW COMPARISON:  02/11/2018 FINDINGS: Osseous mineralization normal. Joint spaces preserved. Dressing artifacts at fifth toe. Soft tissue swelling at dorsum of LEFT foot. No acute fracture, dislocation or bone destruction. IMPRESSION: Soft tissue swelling without acute bony abnormalities. Electronically Signed   By: Ulyses SouthwardMark  Boles M.D.   On: 02/21/2018 16:17    Scheduled Meds: . bacitracin   Topical BID  . enoxaparin (LOVENOX) injection  40 mg Subcutaneous Q24H  . insulin aspart  0-15 Units Subcutaneous TID WC  . insulin glargine  15 Units Subcutaneous QHS   Continuous Infusions: . ampicillin-sulbactam (UNASYN) IV Stopped (02/22/18 1200)     LOS: 1 day   Rickey BarbaraStephen Eureka Valdes, MD [facility] Pager On Amion  If 7PM-7AM, please contact night-coverage 02/22/2018, 2:16 PM

## 2018-02-22 NOTE — Evaluation (Signed)
Physical Therapy Evaluation Patient Details Name: Kelli Hudson MRN: [id number] DOB: 05-23-1990 Today's Date: 02/22/2018   History of Present Illness  28 yo admitted with Left foot diabetic ulcer and  infection with MRI suggestive but not diagnostic of osteomyelitis  Clinical Impression  Pt admitted with above diagnosis. Pt currently with functional limitations due to the deficits listed below (see PT Problem List). Pt Very pleasant and motivated to work with PT, amb ~ 160' with RW to decr wt on LLE (pt with pain with LLE dependent  Positioning and with FWB); will see again as schedule allows to review RW safety and stairs;  Pt will benefit from skilled PT to increase their independence and safety with mobility to allow discharge to the venue listed below.  No f/u needed, may need RW for home     Follow Up Recommendations No PT follow up    Equipment Recommendations  Rolling walker with 5" wheels    Recommendations for Other Services       Precautions / Restrictions Restrictions Weight Bearing Restrictions: No      Mobility  Bed Mobility Overal bed mobility: Modified Independent                Transfers Overall transfer level: Needs assistance Equipment used: Rolling walker (2 wheeled) Transfers: Sit to/from Stand Sit to Stand: Supervision         General transfer comment: cues for hand placement  Ambulation/Gait Ambulation/Gait assistance: Min guard Gait Distance (Feet): 160 Feet Assistive device: Rolling walker (2 wheeled) Gait Pattern/deviations: Step-to pattern;Step-through pattern;Decreased stride length;Decreased weight shift to left     General Gait Details: cues for sequence, RW position and use of UEs to decr wt on L foot  Stairs            Wheelchair Mobility    Modified Rankin (Stroke Patients Only)       Balance Overall balance assessment: Needs assistance   Sitting balance-Leahy Scale: Normal       Standing  balance-Leahy Scale: Fair                               Pertinent Vitals/Pain Pain Assessment: 0-10 Pain Score: 3  Pain Location: L foot with WBing and dependent positioning Pain Descriptors / Indicators: Dull Pain Intervention(s): Monitored during session;Repositioned    Home Living Family/patient expects to be discharged to:: Private residence Living Arrangements: Spouse/significant other Available Help at Discharge: Family;Available PRN/intermittently Type of Home: Apartment Home Access: Stairs to enter Entrance Stairs-Rails: Right;Left;Can reach both Entrance Stairs-Number of Steps: 2 flights Home Layout: One level Home Equipment: None Additional Comments: works at the [employer]    Prior Function Level of Independence: Independent               Higher education careers adviser        Extremity/Trunk Assessment   Upper Extremity Assessment Upper Extremity Assessment: Overall WFL for tasks assessed    Lower Extremity Assessment Lower Extremity Assessment: Overall WFL for tasks assessed       Communication   Communication: No difficulties  Cognition Arousal/Alertness: Awake/alert Behavior During Therapy: WFL for tasks assessed/performed Overall Cognitive Status: Within Functional Limits for tasks assessed                                        General Comments  Exercises General Exercises - Lower Extremity Ankle Circles/Pumps: AROM;Left;10 reps   Assessment/Plan    PT Assessment Patient needs continued PT services  PT Problem List Decreased balance;Decreased knowledge of use of DME;Pain       PT Treatment Interventions DME instruction;Patient/family education;Gait training;Stair training    PT Goals (Current goals can be found in the Care Plan section)  Acute Rehab PT Goals Patient Stated Goal: be able to run PT Goal Formulation: With patient Time For Goal Achievement: 03/08/18 Potential to Achieve Goals: Good     Frequency Min 3X/week   Barriers to discharge        Co-evaluation               AM-PAC PT "6 Clicks" Mobility  Outcome Measure Help needed turning from your back to your side while in a flat bed without using bedrails?: None Help needed moving from lying on your back to sitting on the side of a flat bed without using bedrails?: None Help needed moving to and from a bed to a chair (including a wheelchair)?: A Little Help needed standing up from a chair using your arms (e.g., wheelchair or bedside chair)?: A Little Help needed to walk in hospital room?: A Little Help needed climbing 3-5 steps with a railing? : A Little 6 Click Score: 20    End of Session Equipment Utilized During Treatment: Gait belt Activity Tolerance: Patient tolerated treatment well Patient left: in bed;with call bell/phone within reach;with family/visitor present   PT Visit Diagnosis: Difficulty in walking, not elsewhere classified (R26.2)    Time: 3559-7416 PT Time Calculation (min) (ACUTE ONLY): 18 min   Charges:   PT Evaluation $PT Eval Low Complexity: 1 Low          Drucilla Chalet, PT  Pager: [phone] Acute Rehab Dept [facility]): [phone]   02/22/2018   [facility] 02/22/2018, 2:50 PM

## 2018-02-23 LAB — CBC WITH DIFFERENTIAL/PLATELET
Abs Immature Granulocytes: 0.05 10*3/uL (ref 0.00–0.07)
Basophils Absolute: 0 10*3/uL (ref 0.0–0.1)
Basophils Relative: 0 %
EOS ABS: 0.1 10*3/uL (ref 0.0–0.5)
EOS PCT: 1 %
HCT: 30.7 % — ABNORMAL LOW (ref 36.0–46.0)
Hemoglobin: 9.9 g/dL — ABNORMAL LOW (ref 12.0–15.0)
Immature Granulocytes: 1 %
Lymphocytes Relative: 25 %
Lymphs Abs: 2.7 10*3/uL (ref 0.7–4.0)
MCH: 28.2 pg (ref 26.0–34.0)
MCHC: 32.2 g/dL (ref 30.0–36.0)
MCV: 87.5 fL (ref 80.0–100.0)
Monocytes Absolute: 1.2 10*3/uL — ABNORMAL HIGH (ref 0.1–1.0)
Monocytes Relative: 12 %
Neutro Abs: 6.6 10*3/uL (ref 1.7–7.7)
Neutrophils Relative %: 61 %
Platelets: 417 10*3/uL — ABNORMAL HIGH (ref 150–400)
RBC: 3.51 MIL/uL — ABNORMAL LOW (ref 3.87–5.11)
RDW: 12.2 % (ref 11.5–15.5)
WBC: 10.7 10*3/uL — ABNORMAL HIGH (ref 4.0–10.5)
nRBC: 0 % (ref 0.0–0.2)

## 2018-02-23 LAB — BASIC METABOLIC PANEL
Anion gap: 11 (ref 5–15)
BUN: 13 mg/dL (ref 6–20)
CO2: 24 mmol/L (ref 22–32)
CREATININE: 0.64 mg/dL (ref 0.44–1.00)
Calcium: 8.6 mg/dL — ABNORMAL LOW (ref 8.9–10.3)
Chloride: 100 mmol/L (ref 98–111)
GFR calc Af Amer: >60 mL/min (ref >60)
GFR calc non Af Amer: >60 mL/min (ref >60)
Glucose, Bld: 309 mg/dL — ABNORMAL HIGH (ref 70–99)
Potassium: 3.9 mmol/L (ref 3.5–5.1)
Sodium: 135 mmol/L (ref 135–145)

## 2018-02-23 LAB — GLUCOSE, CAPILLARY
Glucose-Capillary: 237 mg/dL — ABNORMAL HIGH (ref 70–99)
Glucose-Capillary: 190 mg/dL — ABNORMAL HIGH (ref 70–99)
Glucose-Capillary: 80 mg/dL (ref 70–99)

## 2018-02-23 LAB — MAGNESIUM: Magnesium: 1.7 mg/dL (ref 1.7–2.4)

## 2018-02-23 MED ORDER — AMPICILLIN 500 MG PO CAPS: 500.0000 mg | ORAL_CAPSULE | Freq: Four times a day (QID) | ORAL | 0 refills | Status: AC

## 2018-02-23 MED ORDER — INSULIN DETEMIR 100 UNIT/ML FLEXPEN: 18.0000 [IU] | PEN_INJECTOR | Freq: Every day | SUBCUTANEOUS | 0 refills | Status: DC

## 2018-02-23 MED ORDER — INSULIN GLARGINE 100 UNIT/ML ~~LOC~~ SOLN
18.0000 [IU] | Freq: Every day | SUBCUTANEOUS | Status: DC
  Filled 2018-02-23: qty 0.18

## 2018-02-23 MED ORDER — INSULIN ASPART 100 UNIT/ML FLEXPEN: 5.0000 [IU] | PEN_INJECTOR | Freq: Three times a day (TID) | SUBCUTANEOUS | 0 refills | Status: DC

## 2018-02-23 MED ORDER — INSULIN ASPART 100 UNIT/ML ~~LOC~~ SOLN
5.0000 [IU] | Freq: Three times a day (TID) | SUBCUTANEOUS | Status: DC
  Administered 2018-02-23 (×2): 5 [IU] via SUBCUTANEOUS

## 2018-02-23 MED ORDER — INSULIN ASPART 100 UNIT/ML ~~LOC~~ SOLN
5.0000 [IU] | Freq: Three times a day (TID) | SUBCUTANEOUS | Status: DC
  Filled 2018-02-23: qty 0.05

## 2018-02-23 MED ORDER — INSULIN PEN NEEDLE 31G X 5 MM MISC: 1.0000 | Freq: Four times a day (QID) | 0 refills | Status: DC

## 2018-02-23 MED ORDER — OXYCODONE HCL 5 MG PO TABS: 5.0000 mg | ORAL_TABLET | Freq: Four times a day (QID) | ORAL | 0 refills | Status: DC | PRN

## 2018-02-23 NOTE — Progress Notes (Signed)
Physical Therapy Treatment Patient Details Name: Kelli Hudson MRN: [id number] DOB: 1990/04/27 Today's Date: 02/23/2018    History of Present Illness 28 yo admitted with Left foot diabetic ulcer and  infection with MRI suggestive but not diagnostic of osteomyelitis    PT Comments    Pt is progressing well; will continue to follow, pt would like to progress to independence, not requiring RW or other AD for gait;  Pt is mildly unsteady without support of RW d/t L foot pain, today she is able to bear more wt and begin a step through pattern with light support of walker; pt is considering RW for home use; if pt remains in hospital will do a trial of gait without use of RW next session  Follow Up Recommendations  No PT follow up     Equipment Recommendations  Rolling walker with 5" wheels --if pt prefers   Recommendations for Other Services       Precautions / Restrictions Restrictions Weight Bearing Restrictions: No    Mobility  Bed Mobility Overal bed mobility: Modified Independent                Transfers   Equipment used: Rolling walker (2 wheeled) Transfers: Sit to/from Stand Sit to Stand: Supervision;Modified independent (Device/Increase time)         General transfer comment: cues for hand placement, self cues end of session  Ambulation/Gait Ambulation/Gait assistance: Supervision Gait Distance (Feet): 200 Feet Assistive device: Rolling walker (2 wheeled) Gait Pattern/deviations: Step-to pattern;Step-through pattern;Decreased stride length;Decreased weight shift to left     General Gait Details: cues for sequence, RW position and use of UEs to decr wt on L foot   Stairs Stairs: Yes Stairs assistance: Min guard Stair Management: Two rails;Step to pattern;Forwards Number of Stairs: 3 General stair comments: has ~ 16steps at home; cues for sequence, safety   Wheelchair Mobility    Modified Rankin (Stroke Patients Only)       Balance              Standing balance-Leahy Scale: Fair                              Cognition Arousal/Alertness: Awake/alert Behavior During Therapy: WFL for tasks assessed/performed Overall Cognitive Status: Within Functional Limits for tasks assessed                                        Exercises      General Comments General comments (skin integrity, edema, etc.): edecr edema LLE today, reviewed elevation, ankle pumps      Pertinent Vitals/Pain Pain Assessment: 0-10 Pain Score: 2  Pain Location: L foot with WBing and dependent positioning Pain Descriptors / Indicators: Dull Pain Intervention(s): Limited activity within patient's tolerance;Monitored during session    Home Living                      Prior Function            PT Goals (current goals can now be found in the care plan section) Acute Rehab PT Goals Patient Stated Goal: be able to run PT Goal Formulation: With patient Time For Goal Achievement: 03/08/18 Potential to Achieve Goals: Good Progress towards PT goals: Progressing toward goals    Frequency    Min 3X/week  PT Plan Current plan remains appropriate    Co-evaluation              AM-PAC PT "6 Clicks" Mobility   Outcome Measure  Help needed turning from your back to your side while in a flat bed without using bedrails?: None Help needed moving from lying on your back to sitting on the side of a flat bed without using bedrails?: None Help needed moving to and from a bed to a chair (including a wheelchair)?: A Little Help needed standing up from a chair using your arms (e.g., wheelchair or bedside chair)?: A Little Help needed to walk in hospital room?: A Little Help needed climbing 3-5 steps with a railing? : A Little 6 Click Score: 20    End of Session Equipment Utilized During Treatment: Gait belt Activity Tolerance: Patient tolerated treatment well Patient left: in bed;with call bell/phone  within reach;with family/visitor present   PT Visit Diagnosis: Difficulty in walking, not elsewhere classified (R26.2)     Time: 1041-1100 PT Time Calculation (min) (ACUTE ONLY): 19 min  Charges:  $Gait Training: 8-22 mins                     [name], PT  Pager: [phone] Acute Rehab Dept [facility]): [phone]   02/23/2018    [facility],Kelli Budzinski 02/23/2018, 11:07 AM

## 2018-02-23 NOTE — Discharge Summary (Signed)
Physician Discharge Summary  Kelli Hudson DHR:[id number] DOB: May 12, 1990 DOA: 02/21/2018  PCP: Lin Landsman, MD  Admit date: 02/21/2018 Discharge date: 02/23/2018  Admitted From: Home Disposition:  Home  Recommendations for Outpatient Follow-up:  1. Follow up with PCP in 1-2 weeks 2. Follow up with Orthopedic Surgery as scheduled in 2 weeks  Discharge Condition:Improved CODE STATUS:Full Diet recommendation: Diabetic   Brief/Interim Summary: 28 y.o.femalehx of IDDM, HTNwho presents to [facility] ED for L toe ulcer. Patient had noted a blister on the L 5th distal toe for several weeks that popped about 1.5 weeks ago. She was seen by podiatry on 02/18/17 and prescribed doxycycline. Since then, she has noticed increased erythema and swelling extending from the L 5th toe to her lower shin, as well as purulent seeping drainage from the open ulcer. For past 3-4 days, she has also been experiencing subjective fevers and chills. No alleviating factors. She has also noted elevated sugars in 200-300s at home despite compliance with insulin and diet. Of note, wound cultures obtained from the ulcer on 02/18/17 is growing enterococcus faecalis.   Discharge Diagnoses:  Active Problems:   Skin ulcer of left foot with fat layer exposed ([place])   Diabetic foot ulcer ([place])  #L 5th distal toe diabetic ulcer. Initially concerning for osteomyelitis. -+e faecalis on 02/18/18 wound culture -s/p vanc and zosyn in ED 02/21/18 -cont on ampicillin/sublactam -appreciate orthopedics recs. Will complete course of amoxicillin on discharge. Per ortho, follow up with Dr Sharol Given in 2 weeks - diabetes control as below    #IDDM - hyperglycemic to 300s on admission. AG at 64. Likely reactive due to infection. -at home on lantus 15 units and mealtime sliding scale -resumed lantus 15 units -glucose remains in the 200's - increased lantus to 18 units and continue Novolog 5 units before meals -Pt strongly advised to  follow up with Endocrinology as outpatient and PCP  #HTN  - currently normotensive. Not on medications -monitor for now, can address as outpatient - bp remains stable  # Mild normocytic anemia Hb 11, similar to prior levels. Suspect inflammatory vs mixed - monitor with daily CBC and follow up as outpatient.  - remains hemodynamically stable   Discharge Instructions   Allergies as of 02/23/2018   No Known Allergies     Medication List    STOP taking these medications   cephALEXin 500 MG capsule Commonly known as:  KEFLEX   doxycycline 100 MG tablet Commonly known as:  VIBRA-TABS   HYDROcodone-acetaminophen 5-325 MG tablet Commonly known as:  NORCO   Insulin Syringe-Needle U-100 31G X 15/64" 0.5 ML Misc Commonly known as:  BD INSULIN SYRINGE ULTRAFINE   metoCLOPramide 10 MG tablet Commonly known as:  REGLAN   naproxen 500 MG tablet Commonly known as:  NAPROSYN   pantoprazole 40 MG tablet Commonly known as:  PROTONIX     TAKE these medications   ampicillin 500 MG capsule Commonly known as:  PRINCIPEN Take 1 capsule (500 mg total) by mouth 4 (four) times daily for 6 days.   glucose blood test strip Commonly known as:  TRUE METRIX BLOOD GLUCOSE TEST Use as instructed   ibuprofen 200 MG tablet Commonly known as:  ADVIL,MOTRIN Take 400-600 mg by mouth every 6 (six) hours as needed for headache or moderate pain.   insulin aspart 100 UNIT/ML FlexPen Commonly known as:  NOVOLOG Inject 5 Units into the skin 3 (three) times daily with meals.   Insulin Detemir 100 UNIT/ML Pen Commonly known as:  LEVEMIR Inject 18 Units into the skin daily. What changed:    how much to take  when to take this   Insulin Pen Needle 31G X 5 MM Misc 1 Device by Does not apply route QID. For use with insulin pens   mupirocin ointment 2 % Commonly known as:  BACTROBAN Apply 1 application topically 2 (two) times daily.   oxyCODONE 5 MG immediate release tablet Commonly  known as:  Oxy IR/ROXICODONE Take 1 tablet (5 mg total) by mouth every 6 (six) hours as needed for severe pain.   TRUE METRIX METER w/Device Kit 1 Device by Does not apply route 4 (four) times daily.   TRUEPLUS LANCETS 28G Misc assist with checking blood sugar TID and qhs      Follow-up Information    Newt Minion, MD Follow up in 2 week(s).   Specialty:  Orthopedic Surgery Contact information: [place] [address] [phone]        Lin Landsman, MD. Schedule an appointment as soon as possible for a visit in 1 week(s).   Specialty:  Family Medicine Contact information: [place] [address] [phone]          No Known Allergies  Consultations:  Orthopedic Surgery  Procedures/Studies: Dg Chest 2 View  Result Date: 02/21/2018 CLINICAL DATA:  LEFT foot wound, blister popped, pain, former smoker, fever, tachycardia, diabetes mellitus EXAM: CHEST - 2 VIEW COMPARISON:  08/22/2016 FINDINGS: Normal heart size, mediastinal contours, and pulmonary vascularity. Lungs clear. No pleural effusion or pneumothorax. Bones unremarkable. IMPRESSION: Normal exam. Electronically Signed   By: Lavonia Dana M.D.   On: 02/21/2018 16:15   Mr Foot Left W Wo Contrast  Result Date: 02/21/2018 CLINICAL DATA:  Blister on left foot for a few months. History of diabetes. EXAM: MRI OF THE LEFT FOREFOOT WITHOUT AND WITH CONTRAST TECHNIQUE: Multiplanar, multisequence MR imaging of the left forefoot was performed both before and after administration of intravenous contrast. CONTRAST:  8 cc IV Gadavist COMPARISON:  02/21/2018 foot radiographs FINDINGS: Bones/Joint/Cartilage Marrow signal abnormality of the fifth toe starting from the fifth metatarsal head and neck. Associated soft tissue swelling and cellulitis is identified. No acute fracture, bone destruction or suspicious osseous lesions. Ligaments Noncontributory Muscles and Tendons Evidence of myositis or  pyomyositis. Soft tissues Dorsal soft tissue swelling of the fifth digit with slight cutaneous irregularity that may correspond with the history of a blister. IMPRESSION: Cellulitis of the left fifth toe with marrow signal abnormalities raising concern for osteomyelitis of the fifth toe starting from the level of the fifth metatarsal neck and head. Electronically Signed   By: Ashley Royalty M.D.   On: 02/21/2018 19:50   Dg Foot Complete Left  Result Date: 02/21/2018 CLINICAL DATA:  LEFT foot wound, blister popped, lot of pain, swelling, question osteomyelitis, history diabetes mellitus EXAM: LEFT FOOT - COMPLETE 3+ VIEW COMPARISON:  02/11/2018 FINDINGS: Osseous mineralization normal. Joint spaces preserved. Dressing artifacts at fifth toe. Soft tissue swelling at dorsum of LEFT foot. No acute fracture, dislocation or bone destruction. IMPRESSION: Soft tissue swelling without acute bony abnormalities. Electronically Signed   By: Lavonia Dana M.D.   On: 02/21/2018 16:17   Dg Foot Complete Left  Result Date: 02/11/2018 Please see detailed radiograph report in office note.   Subjective: Eager to go home  Discharge Exam: Vitals:   02/23/18 1400 02/23/18 1505  BP: 122/85 104/61  Pulse: (!) 115 (!) 111  Resp: 12  Temp:    SpO2: 99% 99%   Vitals:   02/23/18 0548 02/23/18 1305 02/23/18 1400 02/23/18 1505  BP: 132/90 119/81 122/85 104/61  Pulse: 100 (!) 113 (!) 115 (!) 111  Resp: 17 18 12    Temp: 98.8 F (37.1 C) 98.3 F (36.8 C)    TempSrc: Oral     SpO2: 100% 100% 99% 99%  Weight:      Height:        General: Pt is alert, awake, not in acute distress Cardiovascular: RRR, S1/S2 +, no rubs, no gallops Respiratory: CTA bilaterally, no wheezing, no rhonchi Abdominal: Soft, NT, ND, bowel sounds + Extremities: no edema, no cyanosis   The results of significant diagnostics from this hospitalization (including imaging, microbiology, ancillary and laboratory) are listed below for reference.      Microbiology: Recent Results (from the past 240 hour(s))  WOUND CULTURE     Status: Abnormal   Collection Time: 02/18/18  4:29 PM  Result Value Ref Range Status   MICRO NUMBER: [id number]  Final   SPECIMEN QUALITY: Adequate  Final   SOURCE: NOT GIVEN  Final   STATUS: FINAL  Final   GRAM STAIN: No organisms or white blood cells seen  Final   ISOLATE 1: Enterococcus faecalis (A)  Final    Comment: Moderate growth of Enterococcus faecalis      Susceptibility   Enterococcus faecalis - AEROBIC CULT, GRAM STAIN POSITIVE 1    AMPICILLIN <=2 Sensitive     VANCOMYCIN* 2 Sensitive      * Legend:S = Susceptible  I = IntermediateR = Resistant  NS = Not susceptible* = Not tested  NR = Not reported**NN = See antimicrobic comments  Culture, blood (Routine x 2)     Status: None (Preliminary result)   Collection Time: 02/21/18  3:38 PM  Result Value Ref Range Status   Specimen Description   Final    BLOOD LEFT ANTECUBITAL Performed at [facility], [place] [address], [place], [place] [address]    Special Requests   Final    BOTTLES DRAWN AEROBIC AND ANAEROBIC Blood Culture adequate volume Performed at [facility] [address], [place], [place] [address]    Culture   Final    NO GROWTH 2 DAYS Performed at [facility] [address] [place], [place] [address]    Report Status PENDING  Incomplete  Culture, blood (Routine x 2)     Status: None (Preliminary result)   Collection Time: 02/21/18  3:43 PM  Result Value Ref Range Status   Specimen Description   Final    BLOOD RIGHT HAND Performed at [place] [address], [place], [place] [address]    Special Requests   Final    BOTTLES DRAWN AEROBIC AND ANAEROBIC Blood Culture adequate volume Performed at [place] [address] [place], [place] [address]    Culture   Final    NO GROWTH 1 DAY Performed at [facility], [place] [address] [place], [place] [address]    Report Status PENDING  Incomplete     Labs: BNP (last 3 results) No results for input(s): BNP in the last 8760 hours. Basic Metabolic Panel: Recent Labs  Lab 02/21/18 1538 02/22/18 0411 02/23/18 0425  NA 135 138 135  K 3.9 3.8 3.9  CL 98 104 100  CO2 27 25 24   GLUCOSE 329* 276* 309*  BUN 10 11 13  CREATININE 0.78 0.63 0.64  CALCIUM 9.4 8.4* 8.6*  MG  --  1.5* 1.7   Liver Function Tests: Recent Labs  Lab 02/21/18 1538  AST 12*  ALT 10  ALKPHOS 96  BILITOT 0.5  PROT 8.6*  ALBUMIN 3.1*   No results for input(s): LIPASE, AMYLASE in the last 168 hours. No results for input(s): AMMONIA in the last 168 hours. CBC: Recent Labs  Lab 02/21/18 1538 02/22/18 0411 02/23/18 0425  WBC 11.9* 11.1* 10.7*  NEUTROABS 8.2* 7.1 6.6  HGB 11.9* 10.1* 9.9*  HCT 36.8 31.0* 30.7*  MCV 87.4 86.6 87.5  PLT 492* 435* 417*   Cardiac Enzymes: No results for input(s): CKTOTAL, CKMB, CKMBINDEX, TROPONINI in the last 168 hours. BNP: Invalid input(s): POCBNP CBG: Recent Labs  Lab 02/22/18 1728 02/22/18 2221 02/23/18 0718 02/23/18 1153 02/23/18 1502  GLUCAP 181* 297* 237* 190* 80   D-Dimer No results for input(s): DDIMER in the last 72 hours. Hgb A1c Recent Labs    02/22/18 0411  HGBA1C 13.8*   Lipid Profile No results for input(s): CHOL, HDL, LDLCALC, TRIG, CHOLHDL, LDLDIRECT in the last 72 hours. Thyroid function studies No results for input(s): TSH, T4TOTAL, T3FREE, THYROIDAB in the last 72 hours.  Invalid input(s): FREET3 Anemia work up No results for input(s): VITAMINB12, FOLATE, FERRITIN, TIBC, IRON, RETICCTPCT in the last 72 hours. Urinalysis    Component Value Date/Time   COLORURINE YELLOW 02/22/2018 0146   APPEARANCEUR CLEAR 02/22/2018 0146   LABSPEC 1.026 02/22/2018 0146   PHURINE 5.0 02/22/2018 0146   GLUCOSEU >=500 (A) 02/22/2018 0146   HGBUR NEGATIVE 02/22/2018 0146   BILIRUBINUR NEGATIVE 02/22/2018 0146   KETONESUR 20  (A) 02/22/2018 0146   PROTEINUR NEGATIVE 02/22/2018 0146   UROBILINOGEN 0.2 06/15/2012 1957   NITRITE NEGATIVE 02/22/2018 0146   LEUKOCYTESUR NEGATIVE 02/22/2018 0146   Sepsis Labs Invalid input(s): PROCALCITONIN,  WBC,  LACTICIDVEN Microbiology Recent Results (from the past 240 hour(s))  WOUND CULTURE     Status: Abnormal   Collection Time: 02/18/18  4:29 PM  Result Value Ref Range Status   MICRO NUMBER: [id number]  Final   SPECIMEN QUALITY: Adequate  Final   SOURCE: NOT GIVEN  Final   STATUS: FINAL  Final   GRAM STAIN: No organisms or white blood cells seen  Final   ISOLATE 1: Enterococcus faecalis (A)  Final    Comment: Moderate growth of Enterococcus faecalis      Susceptibility   Enterococcus faecalis - AEROBIC CULT, GRAM STAIN POSITIVE 1    AMPICILLIN <=2 Sensitive     VANCOMYCIN* 2 Sensitive      * Legend:S = Susceptible  I = IntermediateR = Resistant  NS = Not susceptible* = Not tested  NR = Not reported**NN = See antimicrobic comments  Culture, blood (Routine x 2)     Status: None (Preliminary result)   Collection Time: 02/21/18  3:38 PM  Result Value Ref Range Status   Specimen Description   Final    BLOOD LEFT ANTECUBITAL Performed at [facility], [place] [address] [place], [place] [address]    Special Requests   Final    BOTTLES DRAWN AEROBIC AND ANAEROBIC Blood Culture adequate volume Performed at [place] [address], [place], [place] [address]    Culture   Final    NO GROWTH 2 DAYS Performed at [place] [address], [place],  [address]    Report Status PENDING  Incomplete  Culture, blood (Routine x 2)     Status: None (Preliminary result)   Collection Time: 02/21/18  3:43 PM  Result Value Ref Range Status   Specimen Description   Final    BLOOD RIGHT HAND Performed at [place] [place] [address] [place], [place] [address]    Special Requests   Final    BOTTLES DRAWN  AEROBIC AND ANAEROBIC Blood Culture adequate volume Performed at [facility] [address], [place], [place] [address]    Culture   Final    NO GROWTH 1 DAY Performed at [facility], [place] [address] [place], [place] [address]    Report Status PENDING  Incomplete   Time spent: 30 min  SIGNED:   Marylu Lund, MD  [facility] 02/23/2018, 3:19 PM  If 7PM-7AM, please contact night-coverage

## 2018-02-23 NOTE — Progress Notes (Signed)
Inpatient Diabetes Program Recommendations  AACE/ADA: New Consensus Statement on Inpatient Glycemic Control (2015)  Target Ranges:  Prepandial:   less than 140 mg/dL      Peak postprandial:   less than 180 mg/dL (1-2 hours)      Critically ill patients:  140 - 180 mg/dL   Lab Results  Component Value Date   GLUCAP 237 (H) 02/23/2018   HGBA1C 13.8 (H) 02/22/2018    Review of Glycemic Control  Blood sugar > 300 mg/dL this am. Needs Lantus adjustment. Check lunchtime CBGs to determine if more meal coverage insulin is needed.  Inpatient Diabetes Program Recommendations:     Increase Lantus to 18 units QHS Add Novolog HS correction.  Will f/u after reviewing lunchtime CBG.  Thank you. Ailene Ards, RD, LDN, CDE Inpatient Diabetes Coordinator [phone]

## 2018-02-25 ENCOUNTER — Encounter: Payer: Self-pay | Admitting: Podiatry

## 2018-02-25 ENCOUNTER — Ambulatory Visit (INDEPENDENT_AMBULATORY_CARE_PROVIDER_SITE_OTHER): Payer: BLUE CROSS/BLUE SHIELD | Admitting: Podiatry

## 2018-02-25 ENCOUNTER — Ambulatory Visit (INDEPENDENT_AMBULATORY_CARE_PROVIDER_SITE_OTHER): Payer: BLUE CROSS/BLUE SHIELD

## 2018-02-25 VITALS — BP 130/94 | HR 118 | Temp 97.2°F

## 2018-02-25 DIAGNOSIS — L97529 Non-pressure chronic ulcer of other part of left foot with unspecified severity: Secondary | ICD-10-CM

## 2018-02-25 DIAGNOSIS — L97524 Non-pressure chronic ulcer of other part of left foot with necrosis of bone: Secondary | ICD-10-CM

## 2018-02-25 DIAGNOSIS — M86172 Other acute osteomyelitis, left ankle and foot: Secondary | ICD-10-CM

## 2018-02-25 NOTE — Patient Instructions (Signed)

## 2018-02-26 LAB — CULTURE, BLOOD (ROUTINE X 2)
Culture: NO GROWTH
Special Requests: ADEQUATE

## 2018-02-27 LAB — CULTURE, BLOOD (ROUTINE X 2)
Culture: NO GROWTH
Special Requests: ADEQUATE

## 2018-03-02 ENCOUNTER — Ambulatory Visit (INDEPENDENT_AMBULATORY_CARE_PROVIDER_SITE_OTHER): Payer: BLUE CROSS/BLUE SHIELD

## 2018-03-02 ENCOUNTER — Other Ambulatory Visit: Payer: Self-pay

## 2018-03-02 ENCOUNTER — Encounter (HOSPITAL_BASED_OUTPATIENT_CLINIC_OR_DEPARTMENT_OTHER): Payer: Self-pay | Admitting: *Deleted

## 2018-03-02 ENCOUNTER — Ambulatory Visit (INDEPENDENT_AMBULATORY_CARE_PROVIDER_SITE_OTHER): Payer: BLUE CROSS/BLUE SHIELD | Admitting: Podiatry

## 2018-03-02 DIAGNOSIS — L97529 Non-pressure chronic ulcer of other part of left foot with unspecified severity: Secondary | ICD-10-CM | POA: Diagnosis not present

## 2018-03-02 DIAGNOSIS — L97524 Non-pressure chronic ulcer of other part of left foot with necrosis of bone: Secondary | ICD-10-CM | POA: Diagnosis not present

## 2018-03-02 MED ORDER — AMPICILLIN 500 MG PO CAPS: 500.0000 mg | ORAL_CAPSULE | Freq: Four times a day (QID) | ORAL | 0 refills | Status: DC

## 2018-03-02 NOTE — Progress Notes (Signed)
Subjective: 28 year old female presents the office today for wound check.  She is scheduled for toe amputation versus partial fifth ray amputation tomorrow.  She states overall the pain is improved and the swelling has improved however the skin is still peeling on the top of the foot.  The wound continues on the left fifth toe and she wishes to proceed with surgery tomorrow however her only concern is the financial cost.  She states that she was told she had to 800 hours tomorrow but she cannot pay this right now. Denies any systemic complaints such as fevers, chills, nausea, vomiting. No acute changes since last appointment, and no other complaints at this time.   Objective: AAO x3, NAD DP/PT pulses palpable bilaterally, CRT less than 3 seconds The wound does continue on the left fifth toe there is continued edema to the toe as well as lateral aspect of the fifth metatarsal phalangeal joint.  There is decrease swelling to the foot since I last saw her however does continue.  The area of the peeling skin on the dorsal aspect the foot remains there is no drainage.  There is no fluctuation or crepitation of this area.  No ascending cellulitis. No open lesions or pre-ulcerative lesions.  No pain with calf compression, swelling, warmth, erythema  Assessment: Osteomyelitis of left foot  Plan: -All treatment options discussed with the patient including all alternatives, risks, complications.  -Repeat x-rays were obtained reviewed.  There is some radiolucency present in the proximal phalanx.  Also the MRI concerning for osteomyelitis of the distal fifth metatarsal.  At this time would not proceed with surgery we discussed likely partial fifth ray amputation at this point.  We discussed the surgery as well as postoperative course again today including all alternatives, risks, complications and she wishes to proceed.  However due to cost we contacted the surgical center and she cannot afford to do it there  tomorrow.  Recommend cancel the surgery for tomorrow we will plan on doing this at [facility] next week.  She will need a history and physical form completed preoperatively this was given to her today to have filled out.  This needs to be done by her primary care physician.  She verbalized understand that she needs to contact her primary care doctor. -Refill ampicillin today. -Patient encouraged to call the office with any questions, concerns, change in symptoms.   Vivi Barrack DPM

## 2018-03-02 NOTE — Progress Notes (Signed)
Subjective: 28 year old female presents the office today for concerns of a wound, possible infection on her left fifth toe.  Since I last saw her she was admitted to the hospital and orthopedics was consulted.  She presents for post hospital discharge.  Upon discharge she was prescribed ampicillin for which she is still taking.  She is concerned that the toe is not healing and she wants to discuss amputation of the toe.  She currently denies any drainage or pus and she gets pain to the toe itself.  She states the swelling and the redness to her foot is much improved.  She states that after I saw her last she had an increase in swelling or redness of the top of her foot which prompted her to go to the emergency room.Denies any systemic complaints such as fevers, chills, nausea, vomiting. No acute changes since last appointment, and no other complaints at this time.   Objective: AAO x3, NAD DP/PT pulses palpable bilaterally, CRT less than 3 seconds Ulceration has progressed as I last saw her on the left fifth toe and it probes very close to bone.  Small not a clear drainage expressed and there is no fluctuation or crepitation.  There is swelling to the fifth toe.  On the dorsal lateral aspect of the midfoot is an area of peeling skin.  This is over an area that she states was very red when she was in the hospital but the skin is starting to peel but she denies any drainage coming from this area. No open lesions or pre-ulcerative lesions.  No pain with calf compression, swelling, warmth, erythema  Assessment: Left fifth toe ulceration with concern for osteomyelitis  Plan: -All treatment options discussed with the patient including all alternatives, risks, complications.  -I independently reviewed the MRI as well as the chart when she was in the hospital.  At this time we discussed surgical intervention.  I discussed with her toe imitation possible partial fifth ray amputation.  We discussed the surgery  was postoperative course and she wished to proceed at this time. -The incision placement as well as the postoperative course was discussed with the patient. I discussed risks of the surgery which include, but not limited to, infection, bleeding, pain, swelling, need for further surgery, delayed or nonhealing, painful or ugly scar, numbness or sensation changes, over/under correction, recurrence, transfer lesions, further deformity, hardware failure, DVT/PE, loss of toe/foot. Patient understands these risks and wishes to proceed with surgery. The surgical consent was reviewed with the patient all 3 pages were signed. No promises or guarantees were given to the outcome of the procedure. All questions were answered to the best of my ability. Before the surgery the patient was encouraged to call the office if there is any further questions. The surgery will be performed at the [facility] on an outpatient basis. -Continue ampicillin for now. -Patient encouraged to call the office with any questions, concerns, change in symptoms or go to the ER  Vivi Barrack DPM

## 2018-03-05 ENCOUNTER — Other Ambulatory Visit: Payer: BLUE CROSS/BLUE SHIELD

## 2018-03-07 ENCOUNTER — Other Ambulatory Visit: Payer: Self-pay

## 2018-03-07 ENCOUNTER — Encounter (HOSPITAL_COMMUNITY): Payer: Self-pay

## 2018-03-07 DIAGNOSIS — Z79899 Other long term (current) drug therapy: Secondary | ICD-10-CM | POA: Diagnosis not present

## 2018-03-07 DIAGNOSIS — E10621 Type 1 diabetes mellitus with foot ulcer: Secondary | ICD-10-CM | POA: Insufficient documentation

## 2018-03-07 DIAGNOSIS — Z87891 Personal history of nicotine dependence: Secondary | ICD-10-CM | POA: Diagnosis not present

## 2018-03-07 DIAGNOSIS — I10 Essential (primary) hypertension: Secondary | ICD-10-CM | POA: Insufficient documentation

## 2018-03-07 DIAGNOSIS — Z794 Long term (current) use of insulin: Secondary | ICD-10-CM | POA: Insufficient documentation

## 2018-03-07 DIAGNOSIS — M79672 Pain in left foot: Secondary | ICD-10-CM | POA: Diagnosis present

## 2018-03-07 DIAGNOSIS — L97526 Non-pressure chronic ulcer of other part of left foot with bone involvement without evidence of necrosis: Secondary | ICD-10-CM | POA: Diagnosis not present

## 2018-03-07 NOTE — ED Triage Notes (Signed)
States infection in left pinky toe with pus draining out of it with pain states her doctor is supposed to amputate it on Wednesday this week due to the infection.

## 2018-03-08 ENCOUNTER — Telehealth: Payer: Self-pay | Admitting: Podiatry

## 2018-03-08 ENCOUNTER — Emergency Department (HOSPITAL_COMMUNITY)
Admission: EM | Admit: 2018-03-08 | Discharge: 2018-03-08 | Disposition: A | Discharge status: Home / Self Care | Payer: BLUE CROSS/BLUE SHIELD | Attending: Emergency Medicine | Admitting: Emergency Medicine

## 2018-03-08 DIAGNOSIS — E10621 Type 1 diabetes mellitus with foot ulcer: Secondary | ICD-10-CM

## 2018-03-08 DIAGNOSIS — L97526 Non-pressure chronic ulcer of other part of left foot with bone involvement without evidence of necrosis: Secondary | ICD-10-CM

## 2018-03-08 LAB — CBC WITH DIFFERENTIAL/PLATELET
ABS IMMATURE GRANULOCYTES: 0.02 10*3/uL (ref 0.00–0.07)
Basophils Absolute: 0 10*3/uL (ref 0.0–0.1)
Basophils Relative: 1 %
Eosinophils Absolute: 0.2 10*3/uL (ref 0.0–0.5)
Eosinophils Relative: 2 %
HCT: 34.2 % — ABNORMAL LOW (ref 36.0–46.0)
Hemoglobin: 10.9 g/dL — ABNORMAL LOW (ref 12.0–15.0)
Immature Granulocytes: 0 %
Lymphocytes Relative: 47 %
Lymphs Abs: 3.6 10*3/uL (ref 0.7–4.0)
MCH: 27.9 pg (ref 26.0–34.0)
MCHC: 31.9 g/dL (ref 30.0–36.0)
MCV: 87.5 fL (ref 80.0–100.0)
Monocytes Absolute: 0.6 10*3/uL (ref 0.1–1.0)
Monocytes Relative: 8 %
NEUTROS ABS: 3.2 10*3/uL (ref 1.7–7.7)
Neutrophils Relative %: 42 %
PLATELETS: 487 10*3/uL — AB (ref 150–400)
RBC: 3.91 MIL/uL (ref 3.87–5.11)
RDW: 13 % (ref 11.5–15.5)
WBC: 7.6 10*3/uL (ref 4.0–10.5)
nRBC: 0 % (ref 0.0–0.2)

## 2018-03-08 LAB — BASIC METABOLIC PANEL
Anion gap: 7 (ref 5–15)
BUN: 26 mg/dL — ABNORMAL HIGH (ref 6–20)
CO2: 23 mmol/L (ref 22–32)
Calcium: 9.1 mg/dL (ref 8.9–10.3)
Chloride: 104 mmol/L (ref 98–111)
Creatinine, Ser: 0.85 mg/dL (ref 0.44–1.00)
GFR calc Af Amer: >60 mL/min (ref >60)
GFR calc non Af Amer: >60 mL/min (ref >60)
Glucose, Bld: 232 mg/dL — ABNORMAL HIGH (ref 70–99)
POTASSIUM: 4.5 mmol/L (ref 3.5–5.1)
Sodium: 134 mmol/L — ABNORMAL LOW (ref 135–145)

## 2018-03-08 MED ORDER — CEPHALEXIN 500 MG PO CAPS: 500.0000 mg | ORAL_CAPSULE | Freq: Four times a day (QID) | ORAL | 0 refills | Status: DC

## 2018-03-08 MED ORDER — CEPHALEXIN 500 MG PO CAPS
500.0000 mg | ORAL_CAPSULE | Freq: Once | ORAL | Status: DC
  Filled 2018-03-08: qty 1

## 2018-03-08 NOTE — Discharge Instructions (Addendum)
Stop taking amoxicillin. Instead, take cephalexin. Keep your appointment for the surgery on Wednesday.

## 2018-03-08 NOTE — Telephone Encounter (Signed)
I called Kelli Hudson to see how she was doing since going to the ER. Left VM to call back.

## 2018-03-08 NOTE — ED Provider Notes (Signed)
[facility] DEPT Provider Note   CSN: [id number] Arrival date & time: 03/07/18  2131     History   Chief Complaint Chief Complaint  Patient presents with  . Foot Pain    HPI Kelli Hudson is a 28 y.o. female.  The history is provided by the patient.  Foot Pain   She has history of diabetes, hypertension, diabetic ulcer of the left fifth toe with scheduled amputation on January 22 and comes in because of increased swelling and pain in that foot.  She stated she noted this afternoon that her foot was more swollen and pain it increased and it look like an infection was spreading.  She has been on ampicillin, and the infections seem to be doing well until today.  She denies fever or chills.  Pain is rated at 6/10.  Past Medical History:  Diagnosis Date  . Diabetes mellitus without complication ([place])   . Hypertension     Patient Active Problem List   Diagnosis Date Noted  . Diabetic foot ulcer ([place]) 02/21/2018  . Skin ulcer of left foot with fat layer exposed ([place]) 09/24/2017  . Diabetic polyneuropathy associated with type 2 diabetes mellitus ([place]) 09/24/2017  . Pain in right foot 09/24/2017  . Diabetic ketoacidosis without coma associated with type 2 diabetes mellitus ([place]) 01/02/2016  . Gastroparesis   . Leukocytosis   . Hyperglycemia   . Abdominal pain   . Diabetes mellitus due to underlying condition, uncontrolled, with hyperosmolarity without coma, without long-term current use of insulin ([place])   . Increased frequency of urination 06/14/2011  . UTI (urinary tract infection) 06/14/2011  . THYROMEGALY 10/05/2009  . Uncontrolled diabetes mellitus ([place]) 10/05/2009  . Essential hypertension 10/05/2009    Past Surgical History:  Procedure Laterality Date  . ESOPHAGOGASTRODUODENOSCOPY (EGD) WITH PROPOFOL Left 09/08/2015   Procedure: ESOPHAGOGASTRODUODENOSCOPY (EGD) WITH PROPOFOL;  Surgeon: Arta Silence, MD;  Location: [facility] ENDOSCOPY;   Service: Endoscopy;  Laterality: Left;  . WISDOM TOOTH EXTRACTION       OB History   No obstetric history on file.      Home Medications    Prior to Admission medications   Medication Sig Start Date End Date Taking? Authorizing Provider  ampicillin (PRINCIPEN) 500 MG capsule Take 1 capsule (500 mg total) by mouth 4 (four) times daily. 03/02/18   Trula Slade, DPM  Blood Glucose Monitoring Suppl (TRUE METRIX METER) w/Device KIT 1 Device by Does not apply route 4 (four) times daily. 09/10/15   Brayton Caves, PA-C  glucose blood (TRUE METRIX BLOOD GLUCOSE TEST) test strip Use as instructed 09/10/15   Brayton Caves, PA-C  ibuprofen (ADVIL,MOTRIN) 200 MG tablet Take 400-600 mg by mouth every 6 (six) hours as needed for headache or moderate pain.     [provider]  insulin aspart (NOVOLOG) 100 UNIT/ML FlexPen Inject 5 Units into the skin 3 (three) times daily with meals. 02/23/18   Donne Hazel, MD  Insulin Detemir (LEVEMIR) 100 UNIT/ML Pen Inject 18 Units into the skin daily. 02/23/18   Donne Hazel, MD  Insulin Pen Needle 31G X 5 MM MISC 1 Device by Does not apply route QID. For use with insulin pens 02/23/18   Donne Hazel, MD  mupirocin ointment (BACTROBAN) 2 % Apply 1 application topically 2 (two) times daily. 02/11/18   Trula Slade, DPM  oxyCODONE (OXY IR/ROXICODONE) 5 MG immediate release tablet Take 1 tablet (5 mg total) by mouth every  6 (six) hours as needed for severe pain. 02/23/18   Donne Hazel, MD  TRUEPLUS LANCETS 28G MISC assist with checking blood sugar TID and qhs 09/10/15   Brayton Caves, PA-C    Family History Family History  Problem Relation Age of Onset  . Lung cancer Mother   . Bipolar disorder Father     Social History Social History   Tobacco Use  . Smoking status: Former Research scientist (life sciences)  . Smokeless tobacco: Never Used  Substance Use Topics  . Alcohol use: Yes    Alcohol/week: 1.0 standard drinks    Types: 1 Shots of liquor per week      Comment: occasional   . Drug use: No     Allergies   Patient has no known allergies.   Review of Systems Review of Systems  All other systems reviewed and are negative.    Physical Exam Updated Vital Signs BP 114/87 (BP Location: Left Arm)   Pulse (!) 102   Temp 97.8 F (36.6 C) (Oral)   Resp 16   Ht 5' 8"  (1.727 m)   Wt 83.9 kg   SpO2 99%   BMI 28.13 kg/m   Physical Exam Vitals signs and nursing note reviewed.    28 year old female, resting comfortably and in no acute distress. Vital signs are significant for borderline elevation of heart rate. Oxygen saturation is 99%, which is normal. Head is normocephalic and atraumatic. PERRLA, EOMI. Oropharynx is clear. Neck is nontender and supple without adenopathy or JVD. Back is nontender and there is no CVA tenderness. Lungs are clear without rales, wheezes, or rhonchi. Chest is nontender. Heart has regular rate and rhythm without murmur. Abdomen is soft, flat, nontender without masses or hepatosplenomegaly and peristalsis is normoactive. Extremities: 1+ pitting edema of left lower leg, trace pitting edema right lower leg.  2+ pedal edema on the left.  Desquamation of the skin of the dorsum of the foot present.  Ulcer over the left fifth toe present without obvious drainage. Skin is warm and dry without rash. Neurologic: Mental status is normal, cranial nerves are intact, there are no motor or sensory deficits.  ED Treatments / Results  Labs (all labs ordered are listed, but only abnormal results are displayed) Labs Reviewed  CBC WITH DIFFERENTIAL/PLATELET - Abnormal; Notable for the following components:      Result Value   Hemoglobin 10.9 (*)    HCT 34.2 (*)    Platelets 487 (*)    All other components within normal limits  BASIC METABOLIC PANEL - Abnormal; Notable for the following components:   Sodium 134 (*)    Glucose, Bld 232 (*)    BUN 26 (*)    All other components within normal limits    Procedures Procedures   Medications Ordered in ED Medications  cephALEXin (KEFLEX) capsule 500 mg (has no administration in time range)     Initial Impression / Assessment and Plan / ED Course  I have reviewed the triage vital signs and the nursing notes.  Pertinent lab results that were available during my care of the patient were reviewed by me and considered in my medical decision making (see chart for details).  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  With worsening of swelling, suspect infection is not being adequately controlled with ampicillin.  Will check routine labs, anticipate switching to cephalexin for antibiotics.  At this point, I do not see an indication for admission or attempting to  move her surgery date up.  Old records are reviewed confirming podiatry evaluation and treatment of left foot ulcer.  Labs are reassuring.  WBC is normal.  Glucose only mildly elevated to 232.  She is given prescription for cephalexin and is advised to keep her appointment for toe amputation 2 days from now.  Return precautions discussed.  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

## 2018-03-09 ENCOUNTER — Encounter (HOSPITAL_BASED_OUTPATIENT_CLINIC_OR_DEPARTMENT_OTHER)
Admission: RE | Admit: 2018-03-09 | Discharge: 2018-03-09 | Disposition: A | Discharge status: Home / Self Care | Payer: BLUE CROSS/BLUE SHIELD | Source: Ambulatory Visit | Attending: Podiatry | Admitting: Podiatry

## 2018-03-09 DIAGNOSIS — Z01810 Encounter for preprocedural cardiovascular examination: Secondary | ICD-10-CM | POA: Insufficient documentation

## 2018-03-10 ENCOUNTER — Ambulatory Visit (HOSPITAL_BASED_OUTPATIENT_CLINIC_OR_DEPARTMENT_OTHER): Payer: BLUE CROSS/BLUE SHIELD | Admitting: Anesthesiology

## 2018-03-10 ENCOUNTER — Ambulatory Visit (HOSPITAL_BASED_OUTPATIENT_CLINIC_OR_DEPARTMENT_OTHER)
Admission: RE | Admit: 2018-03-10 | Discharge: 2018-03-10 | Disposition: A | Discharge status: Home / Self Care | Payer: BLUE CROSS/BLUE SHIELD | Attending: Podiatry | Admitting: Podiatry

## 2018-03-10 ENCOUNTER — Ambulatory Visit (HOSPITAL_COMMUNITY): Payer: BLUE CROSS/BLUE SHIELD

## 2018-03-10 ENCOUNTER — Encounter: Payer: Self-pay | Admitting: Podiatry

## 2018-03-10 ENCOUNTER — Other Ambulatory Visit: Payer: Self-pay

## 2018-03-10 ENCOUNTER — Encounter (HOSPITAL_BASED_OUTPATIENT_CLINIC_OR_DEPARTMENT_OTHER)
Admission: RE | Disposition: A | Discharge status: Home / Self Care | Payer: Self-pay | Source: Home / Self Care | Attending: Podiatry

## 2018-03-10 ENCOUNTER — Encounter (HOSPITAL_BASED_OUTPATIENT_CLINIC_OR_DEPARTMENT_OTHER): Payer: Self-pay | Admitting: *Deleted

## 2018-03-10 DIAGNOSIS — I10 Essential (primary) hypertension: Secondary | ICD-10-CM | POA: Insufficient documentation

## 2018-03-10 DIAGNOSIS — E1169 Type 2 diabetes mellitus with other specified complication: Secondary | ICD-10-CM | POA: Insufficient documentation

## 2018-03-10 DIAGNOSIS — E1165 Type 2 diabetes mellitus with hyperglycemia: Secondary | ICD-10-CM | POA: Diagnosis not present

## 2018-03-10 DIAGNOSIS — M869 Osteomyelitis, unspecified: Secondary | ICD-10-CM | POA: Insufficient documentation

## 2018-03-10 DIAGNOSIS — Z09 Encounter for follow-up examination after completed treatment for conditions other than malignant neoplasm: Secondary | ICD-10-CM

## 2018-03-10 DIAGNOSIS — Z87891 Personal history of nicotine dependence: Secondary | ICD-10-CM | POA: Diagnosis not present

## 2018-03-10 DIAGNOSIS — M86672 Other chronic osteomyelitis, left ankle and foot: Secondary | ICD-10-CM

## 2018-03-10 DIAGNOSIS — Z9114 Patient's other noncompliance with medication regimen: Secondary | ICD-10-CM | POA: Diagnosis not present

## 2018-03-10 HISTORY — PX: AMPUTATION TOE: SHX6595

## 2018-03-10 LAB — POCT PREGNANCY, URINE: Preg Test, Ur: NEGATIVE

## 2018-03-10 LAB — GLUCOSE, CAPILLARY: GLUCOSE-CAPILLARY: 170 mg/dL — AB (ref 70–99)

## 2018-03-10 SURGERY — AMPUTATION, TOE
Anesthesia: Monitor Anesthesia Care | Site: Foot | Laterality: Left

## 2018-03-10 MED ORDER — FENTANYL CITRATE (PF) 100 MCG/2ML IJ SOLN
50.0000 ug | INTRAMUSCULAR | Status: DC | PRN
  Administered 2018-03-10: 100 ug via INTRAVENOUS

## 2018-03-10 MED ORDER — FENTANYL CITRATE (PF) 100 MCG/2ML IJ SOLN
INTRAMUSCULAR | Status: AC
  Filled 2018-03-10: qty 2

## 2018-03-10 MED ORDER — CEFAZOLIN SODIUM-DEXTROSE 2-4 GM/100ML-% IV SOLN
2.0000 g | INTRAVENOUS | Status: AC
  Administered 2018-03-10: 2 g via INTRAVENOUS

## 2018-03-10 MED ORDER — OXYCODONE HCL 5 MG PO TABS: 5.0000 mg | ORAL_TABLET | Freq: Once | ORAL | Status: DC | PRN

## 2018-03-10 MED ORDER — OXYCODONE HCL 5 MG/5ML PO SOLN: 5.0000 mg | Freq: Once | ORAL | Status: DC | PRN

## 2018-03-10 MED ORDER — ONDANSETRON HCL 4 MG/2ML IJ SOLN: 4.0000 mg | Freq: Once | INTRAMUSCULAR | Status: DC | PRN

## 2018-03-10 MED ORDER — CEFAZOLIN SODIUM-DEXTROSE 2-4 GM/100ML-% IV SOLN
INTRAVENOUS | Status: AC
  Filled 2018-03-10: qty 100

## 2018-03-10 MED ORDER — ACETAMINOPHEN 160 MG/5ML PO SOLN: 325.0000 mg | ORAL | Status: DC | PRN

## 2018-03-10 MED ORDER — CHLORHEXIDINE GLUCONATE CLOTH 2 % EX PADS: 6.0000 | MEDICATED_PAD | Freq: Once | CUTANEOUS | Status: DC

## 2018-03-10 MED ORDER — LIDOCAINE HCL 2 % IJ SOLN
INTRAMUSCULAR | Status: DC | PRN
  Administered 2018-03-10: 5 mL

## 2018-03-10 MED ORDER — ONDANSETRON HCL 4 MG/2ML IJ SOLN
INTRAMUSCULAR | Status: DC | PRN
  Administered 2018-03-10: 4 mg via INTRAVENOUS

## 2018-03-10 MED ORDER — PROMETHAZINE HCL 25 MG PO TABS: 25.0000 mg | ORAL_TABLET | Freq: Three times a day (TID) | ORAL | 0 refills | Status: DC | PRN

## 2018-03-10 MED ORDER — PROPOFOL 500 MG/50ML IV EMUL
INTRAVENOUS | Status: DC | PRN
  Administered 2018-03-10: 75 ug/kg/min via INTRAVENOUS

## 2018-03-10 MED ORDER — FENTANYL CITRATE (PF) 100 MCG/2ML IJ SOLN: 25.0000 ug | INTRAMUSCULAR | Status: DC | PRN

## 2018-03-10 MED ORDER — OXYCODONE-ACETAMINOPHEN 5-325 MG PO TABS: 1.0000 | ORAL_TABLET | Freq: Four times a day (QID) | ORAL | 0 refills | Status: AC | PRN

## 2018-03-10 MED ORDER — MIDAZOLAM HCL 2 MG/2ML IJ SOLN
1.0000 mg | INTRAMUSCULAR | Status: DC | PRN
  Administered 2018-03-10: 2 mg via INTRAVENOUS

## 2018-03-10 MED ORDER — BUPIVACAINE HCL (PF) 0.5 % IJ SOLN
INTRAMUSCULAR | Status: DC | PRN
  Administered 2018-03-10: 5 mL

## 2018-03-10 MED ORDER — SCOPOLAMINE 1 MG/3DAYS TD PT72: 1.0000 | MEDICATED_PATCH | Freq: Once | TRANSDERMAL | Status: DC | PRN

## 2018-03-10 MED ORDER — MIDAZOLAM HCL 2 MG/2ML IJ SOLN
INTRAMUSCULAR | Status: AC
  Filled 2018-03-10: qty 2

## 2018-03-10 MED ORDER — MEPERIDINE HCL 25 MG/ML IJ SOLN: 6.2500 mg | INTRAMUSCULAR | Status: DC | PRN

## 2018-03-10 MED ORDER — LACTATED RINGERS IV SOLN
INTRAVENOUS | Status: DC
  Administered 2018-03-10: 12:00:00 via INTRAVENOUS

## 2018-03-10 MED ORDER — ACETAMINOPHEN 325 MG PO TABS: 325.0000 mg | ORAL_TABLET | ORAL | Status: DC | PRN

## 2018-03-10 SURGICAL SUPPLY — 46 items
BANDAGE ACE 3X5.8 VEL STRL LF (GAUZE/BANDAGES/DRESSINGS) ×3 IMPLANT
BLADE AVERAGE 25MMX9MM (BLADE)
BLADE AVERAGE 25X9 (BLADE) IMPLANT
BLADE SURG 15 STRL LF DISP TIS (BLADE) ×2 IMPLANT
BLADE SURG 15 STRL SS (BLADE) ×6
BNDG CMPR 9X4 STRL LF SNTH (GAUZE/BANDAGES/DRESSINGS) ×1
BNDG CONFORM 2 STRL LF (GAUZE/BANDAGES/DRESSINGS) ×3 IMPLANT
BNDG ESMARK 4X9 LF (GAUZE/BANDAGES/DRESSINGS) ×3 IMPLANT
BNDG GAUZE ELAST 4 BULKY (GAUZE/BANDAGES/DRESSINGS) ×3 IMPLANT
BOOT STEPPER DURA LG (SOFTGOODS) ×2 IMPLANT
COVER BACK TABLE 60X90IN (DRAPES) ×3 IMPLANT
COVER WAND RF STERILE (DRAPES) IMPLANT
CUFF TOURNIQUET SINGLE 18IN (TOURNIQUET CUFF) IMPLANT
DRAPE EXTREMITY T 121X128X90 (DISPOSABLE) ×3 IMPLANT
DRAPE IMP U-DRAPE 54X76 (DRAPES) ×3 IMPLANT
DRSG EMULSION OIL 3X3 NADH (GAUZE/BANDAGES/DRESSINGS) ×3 IMPLANT
DURAPREP 26ML APPLICATOR (WOUND CARE) ×3 IMPLANT
ELECT REM PT RETURN 9FT ADLT (ELECTROSURGICAL) ×3
ELECTRODE REM PT RTRN 9FT ADLT (ELECTROSURGICAL) ×1 IMPLANT
GAUZE 4X4 16PLY RFD (DISPOSABLE) IMPLANT
GAUZE SPONGE 4X4 12PLY STRL (GAUZE/BANDAGES/DRESSINGS) ×3 IMPLANT
GLOVE BIO SURGEON STRL SZ7.5 (GLOVE) ×6 IMPLANT
GOWN STRL REUS W/ TWL LRG LVL3 (GOWN DISPOSABLE) ×1 IMPLANT
GOWN STRL REUS W/ TWL XL LVL3 (GOWN DISPOSABLE) ×1 IMPLANT
GOWN STRL REUS W/TWL LRG LVL3 (GOWN DISPOSABLE) ×3
GOWN STRL REUS W/TWL XL LVL3 (GOWN DISPOSABLE) ×3
NDL HYPO 25X1 1.5 SAFETY (NEEDLE) ×1 IMPLANT
NDL SAFETY ECLIPSE 18X1.5 (NEEDLE) IMPLANT
NEEDLE HYPO 18GX1.5 SHARP (NEEDLE)
NEEDLE HYPO 25X1 1.5 SAFETY (NEEDLE) ×3 IMPLANT
NS IRRIG 1000ML POUR BTL (IV SOLUTION) IMPLANT
PACK BASIN DAY SURGERY FS (CUSTOM PROCEDURE TRAY) ×3 IMPLANT
PADDING CAST ABS 4INX4YD NS (CAST SUPPLIES) ×2
PADDING CAST ABS COTTON 4X4 ST (CAST SUPPLIES) ×1 IMPLANT
PENCIL BUTTON HOLSTER BLD 10FT (ELECTRODE) ×3 IMPLANT
STOCKINETTE 6  STRL (DRAPES) ×2
STOCKINETTE 6 STRL (DRAPES) ×1 IMPLANT
STRIP SUTURE WOUND CLOSURE 1/2 (SUTURE) IMPLANT
SUT MERSILENE 2.0 SH NDLE (SUTURE) ×3 IMPLANT
SUT MNCRL AB 3-0 PS2 18 (SUTURE) IMPLANT
SUT MNCRL AB 4-0 PS2 18 (SUTURE) IMPLANT
SUT MON AB 5-0 PS2 18 (SUTURE) IMPLANT
SUT PROLENE 3 0 PS 2 (SUTURE) IMPLANT
SYR 10ML LL (SYRINGE) IMPLANT
SYR BULB 3OZ (MISCELLANEOUS) ×3 IMPLANT
UNDERPAD 30X30 (UNDERPADS AND DIAPERS) ×3 IMPLANT

## 2018-03-10 NOTE — Anesthesia Postprocedure Evaluation (Signed)
Anesthesia Post Note  Patient: Engineer, mining  Procedure(s) Performed: AMPUTATION FIFTH TOE (Left Foot)     Patient location during evaluation: PACU Anesthesia Type: MAC Level of consciousness: awake and alert Pain management: pain level controlled Vital Signs Assessment: post-procedure vital signs reviewed and stable Respiratory status: spontaneous breathing, nonlabored ventilation, respiratory function stable and patient connected to nasal cannula oxygen Cardiovascular status: stable and blood pressure returned to baseline Postop Assessment: no apparent nausea or vomiting Anesthetic complications: no    Last Vitals:  Vitals:   03/10/18 1400 03/10/18 1421  BP: 129/90 126/90  Pulse: (!) 107 (!) 110  Resp: 12 16  Temp:  36.9 C  SpO2: 100% 100%    Last Pain:  Vitals:   03/10/18 1421  TempSrc:   PainSc: 0-No pain                 Roschelle Calandra

## 2018-03-10 NOTE — H&P (Signed)
Subjective: 28 year old female presents to [facility] for surgical intervention due to nonhealing wound and osteomyelitis of the left foot. She developed a wound on the left 5th toe which was not healing. She had worsening cellulitis and infection and was ultimately admitted to the hospital. She was discharged home with amoxicillin. She has been taking that. Actually over the weekend the wound worsened and she went back to the ER and antibiotics changed. The wound has improved from when she went to the ER but overall not healed and she thinks it is best to go ahead and proceed with surgery due to infection.  Denies any systemic complaints such as fevers, chills, nausea, vomiting. No acute changes since last appointment, and no other complaints at this time.   A1c 13.8  Objective: AAO x3, NAD DP/PT pulses palpable bilaterally, CRT less than 3 seconds Wound continues on the left 5th toe. There appears to be bone loss to the PIPJ area when putting the toe through ROM. There is edema to the foot on the left which continues. There is no warmth. There is skin peeling on the dorsal foot. No fluctuance or crepitance. No other open lesions identified today.   No open lesions or pre-ulcerative lesions.  No pain with calf compression, swelling, warmth, erythema  Assessment: Left 5th toe ulcer, osteomyelitis  Plan: -All treatment options discussed with the patient including all alternatives, risks, complications.  -I again reviewed the surgery and the postop course. She wants to still proceed with surgery and she has no questions. Again reviewed risks including but not limited to, delayed/nonhealing, scar, spread of infection, further amputation. Again discussed the importance of glucose control as her A1c is very elevated and this can greatly impact healing. Surgical consent was signed. NPO.   Ovid Curd, DPM

## 2018-03-10 NOTE — Transfer of Care (Signed)
Immediate Anesthesia Transfer of Care Note  Patient: Kelli Hudson  Procedure(s) Performed: AMPUTATION FIFTH TOE (Left Foot)  Patient Location: PACU  Anesthesia Type:MAC  Level of Consciousness: awake, alert , oriented and patient cooperative  Airway & Oxygen Therapy: Patient Spontanous Breathing and Patient connected to face mask oxygen  Post-op Assessment: Report given to RN and Post -op Vital signs reviewed and stable  Post vital signs: Reviewed and stable  Last Vitals:  Vitals Value Taken Time  BP    Temp    Pulse    Resp    SpO2      Last Pain:  Vitals:   03/10/18 1127  TempSrc: Oral  PainSc: 0-No pain         Complications: No apparent anesthesia complications

## 2018-03-10 NOTE — Op Note (Signed)
PRE-OPERATIVE DIAGNOSIS:  OSTEOMYLITIS left fifth toe  POST-OPERATIVE DIAGNOSIS:  OSTEOMYLITIS left fifth toe  PROCEDURE:  Procedure(s): AMPUTATION FIFTH TOE (Left)  SURGEON:  Surgeon(s) and Role:    Trula Slade, DPM - Primary  PHYSICIAN ASSISTANT:   ASSISTANTS: none   ANESTHESIA:   MAC  EBL:  < 50 cc   BLOOD ADMINISTERED:none  DRAINS: none   LOCAL MEDICATIONS USED:  OTHER 10 cc 1:1 mix of 0.5% marcaine plain and 2% lidocaine plain  SPECIMEN:  Source of Specimen:  left 5th metatarsal clean margin for micro and path  DISPOSITION OF SPECIMEN:  PATHOLOGY  COUNTS:  YES  TOURNIQUET:  * Missing tourniquet times found for documented tourniquets in log: [id number] *  DICTATION: .Dragon Dictation  PLAN OF CARE: Discharge to home after PACU  PATIENT DISPOSITION:  PACU - hemodynamically stable.   Delay start of Pharmacological VTE agent (>24hrs) due to surgical blood loss or risk of bleeding: no           Indications for surgery: Kelli Hudson has had a chronic wound in the left fifth toe.  She was treated with local wound care however the wound worsened and she went to the emergency room she is admitted to the hospital at that time she was given IV antibiotics.  MRI was suspicious for osteomyelitis at that time.  She then followed up in the office and the wound progressed and she was still having swelling and signs of infection.  Because of this the wanted to go ahead and proceed with surgery.  Discussed all alternatives, risks, complications including surgery versus trying to save the toe.  After discussion we elected to proceed with amputation.  No promises or guarantees were given second of the procedure and all questions were answered to the best my ability.  Procedure in detail: The patient was both verbally and visually identified by myself, the nursing staff, the anesthesia staff in the preoperative holding area.  She was then transferred to the operating  room and placed on the operating table in supine position.  After adequate plane of anesthesia was obtained a well-padded pneumatic ankle tourniquet was placed in the left ankle.  Of note it was not inflated during the procedure.  Next a total of 10 cc of 2% lidocaine and 0.5% Marcaine plain was infiltrated in a regional block fashion.  Both extremities and scrubbed, prepped, draped and also fashion.  A racquet shaped incision was planned along the fifth ray.  Incision was made with a 15 blade scalpel from skin to bone circumferentially around the fifth metatarsal phalangeal joint.  The toe was disarticulated at this time.  Soft tissue structures were freed from the fifth metatarsal.  Upon evaluation there was found to be some discoloration and signs of infection of the distal fifth metatarsal head.  Given the MRI findings were also suspicious for osteomyelitis wants to go to proceed with resection of the fifth metatarsal distally.  A sagittal bone saw was utilized to excise the fifth metatarsal head and neck.  Upon further evaluation the proximal metatarsal appeared to be viable.  The bone was hard in nature and white in color.  I used a rongeur in order to take a specimen from the clean bone I sent this for pathology and microbiology for clean margins.  A further evaluated the wound there is no signs of an abscess or purulence.  All nonviable tissue was debrided with a 15 blade scalpel. The 4th metatarsal was not exposed.  The incision was copiously irrigated with saline and hemostasis was achieved.  Incision was closed lightly with 3-0 Prolene to allow for drainage. Adaptic was placed over the incision followed by a DSD. She was awoken for anesthesia and found to tolerate the procedure well. She was transferred to PACU with vital signs stable and vascular status intact.   Kelli Hudson, DPM

## 2018-03-10 NOTE — Brief Op Note (Signed)
03/10/2018  1:17 PM  PATIENT:  Laurie Panda  28 y.o. female  PRE-OPERATIVE DIAGNOSIS:  OSTEOMYLITIS left fifth toe  POST-OPERATIVE DIAGNOSIS:  OSTEOMYLITIS left fifth toe  PROCEDURE:  Procedure(s): AMPUTATION FIFTH TOE (Left)  SURGEON:  Surgeon(s) and Role:    Trula Slade, DPM - Primary  PHYSICIAN ASSISTANT:   ASSISTANTS: none   ANESTHESIA:   MAC  EBL:  < 50 cc   BLOOD ADMINISTERED:none  DRAINS: none   LOCAL MEDICATIONS USED:  OTHER 10 cc 1:1 mix of 0.5% marcaine plain and 2% lidocaine plain  SPECIMEN:  Source of Specimen:  left 5th metatarsal clean margin for micro and path  DISPOSITION OF SPECIMEN:  PATHOLOGY  COUNTS:  YES  TOURNIQUET:  * Missing tourniquet times found for documented tourniquets in log: [id number] *  DICTATION: .Dragon Dictation  PLAN OF CARE: Discharge to home after PACU  PATIENT DISPOSITION:  PACU - hemodynamically stable.   Delay start of Pharmacological VTE agent (>24hrs) due to surgical blood loss or risk of bleeding: no

## 2018-03-10 NOTE — Anesthesia Preprocedure Evaluation (Signed)
Anesthesia Evaluation  Patient identified by MRN, date of birth, ID band Patient awake    Reviewed: Allergy & Precautions, H&P , NPO status , Patient's Chart, lab work & pertinent test results  Airway Mallampati: I       Dental  (+) Teeth Intact   Pulmonary former smoker,    breath sounds clear to auscultation       Cardiovascular hypertension,  Rhythm:Regular Rate:Normal     Neuro/Psych    GI/Hepatic Abd pain   Endo/Other  diabetes, Poorly ControlledPt noncompliant c meds  Renal/GU      Musculoskeletal   Abdominal (+) - obese,   Peds  Hematology   Anesthesia Other Findings   Reproductive/Obstetrics                             Anesthesia Physical  Anesthesia Plan  ASA: II  Anesthesia Plan: MAC   Post-op Pain Management:    Induction:   PONV Risk Score and Plan: 2 and Treatment may vary due to age or medical condition  Airway Management Planned: Natural Airway and Nasal Cannula  Additional Equipment:   Intra-op Plan:   Post-operative Plan:   Informed Consent: I have reviewed the patients History and Physical, chart, labs and discussed the procedure including the risks, benefits and alternatives for the proposed anesthesia with the patient or authorized representative who has indicated his/her understanding and acceptance.       Plan Discussed with: CRNA, Anesthesiologist and Surgeon  Anesthesia Plan Comments:         Anesthesia Quick Evaluation

## 2018-03-10 NOTE — Anesthesia Procedure Notes (Signed)
Procedure Name: MAC Date/Time: 03/10/2018 12:35 PM Performed by: Signe Colt, CRNA Pre-anesthesia Checklist: Patient identified, Emergency Drugs available, Suction available, Patient being monitored and Timeout performed Patient Re-evaluated:Patient Re-evaluated prior to induction Oxygen Delivery Method: Simple face mask

## 2018-03-10 NOTE — Discharge Instructions (Signed)

## 2018-03-11 ENCOUNTER — Telehealth: Payer: Self-pay | Admitting: *Deleted

## 2018-03-11 ENCOUNTER — Encounter (HOSPITAL_BASED_OUTPATIENT_CLINIC_OR_DEPARTMENT_OTHER): Payer: Self-pay | Admitting: Podiatry

## 2018-03-11 NOTE — Telephone Encounter (Signed)
Called and spoke with the patient and the patient states that the pain level is a 0 and feels a little nausea from the pain medicine which was taken this am and I am still a little sleepy and I am icing and elevating and I stated to call the office if any concerns or questions at [phone]. Kelli Hudson

## 2018-03-12 ENCOUNTER — Ambulatory Visit (INDEPENDENT_AMBULATORY_CARE_PROVIDER_SITE_OTHER): Payer: BLUE CROSS/BLUE SHIELD

## 2018-03-12 ENCOUNTER — Ambulatory Visit (INDEPENDENT_AMBULATORY_CARE_PROVIDER_SITE_OTHER): Payer: Self-pay | Admitting: Podiatry

## 2018-03-12 VITALS — Temp 97.6°F

## 2018-03-12 DIAGNOSIS — L97524 Non-pressure chronic ulcer of other part of left foot with necrosis of bone: Secondary | ICD-10-CM

## 2018-03-15 ENCOUNTER — Telehealth: Payer: Self-pay | Admitting: *Deleted

## 2018-03-15 LAB — AEROBIC/ANAEROBIC CULTURE W GRAM STAIN (SURGICAL/DEEP WOUND)
Culture: NO GROWTH
Gram Stain: NONE SEEN

## 2018-03-15 MED ORDER — CEPHALEXIN 500 MG PO CAPS: 500.0000 mg | ORAL_CAPSULE | Freq: Four times a day (QID) | ORAL | 0 refills | Status: DC

## 2018-03-15 NOTE — Telephone Encounter (Signed)
Pt states she needs a refill of the antibiotic she was on, Dr. Ardelle Anton wanted her to continue.

## 2018-03-15 NOTE — Telephone Encounter (Signed)
I informed pt of Dr. Wagoner's orders. 

## 2018-03-15 NOTE — Telephone Encounter (Signed)
Please refill the keflex

## 2018-03-16 NOTE — Progress Notes (Signed)
Subjective: Kelli Hudson is a 28 y.o. 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 She states that her pain is controlled and she is not taking anything for pain. She has been wearing the surgical boot. She has been trying to stay off of her foot as much as possible. She is still on keflex. Denies any systemic complaints such as fevers, chills, nausea, vomiting. No calf pain, chest pain, shortness of breath.   Objective: General: No acute distress, AAOx3  DP/PT pulses palpable 2/4, CRT < 3 sec to all digits.  LEFT foot: Incision is well coapted without any evidence of dehiscence with sutures intact. There is no surrounding erythema, ascending cellulitis, fluctuance, crepitus, malodor, drainage/purulence. There is mnimal edema around the surgical site. There is no pain along the surgical site. Incision is healing well without any signs of infection.   No other areas of tenderness to bilateral lower extremities.  No other open lesions or pre-ulcerative lesions.  No pain with calf compression, swelling, warmth, erythema.   Assessment and Plan:  Status post left foot surgery, doing well with no complications   -Treatment options discussed including all alternatives, risks, and complications -X-rays were obtained and reviewed with the patient. S/p partial 5th ray amputation.  -Incision is healing well. Antibiotic ointment is applied followed by a DSD. Keep the dressing clean, dry, intact.  -Remain in surgical boot.  -Limit WB -Elevation -Pain medication as needed- she has not been needing this.  -Monitor for any clinical signs or symptoms of infection and DVT/PE and directed to call the office immediately should any occur or go to the ER. -Follow-up as scheduled or sooner if any problems arise. In the meantime, encouraged to call the office with any questions, concerns, change in symptoms.   Ovid Curd, DPM

## 2018-03-19 ENCOUNTER — Ambulatory Visit (INDEPENDENT_AMBULATORY_CARE_PROVIDER_SITE_OTHER): Payer: BLUE CROSS/BLUE SHIELD | Admitting: Podiatry

## 2018-03-19 ENCOUNTER — Encounter: Payer: Self-pay | Admitting: Podiatry

## 2018-03-19 DIAGNOSIS — L97524 Non-pressure chronic ulcer of other part of left foot with necrosis of bone: Secondary | ICD-10-CM

## 2018-03-19 DIAGNOSIS — Z09 Encounter for follow-up examination after completed treatment for conditions other than malignant neoplasm: Secondary | ICD-10-CM

## 2018-03-22 ENCOUNTER — Telehealth: Payer: Self-pay | Admitting: *Deleted

## 2018-03-22 NOTE — Telephone Encounter (Signed)
Left message for pt to call for results  

## 2018-03-22 NOTE — Telephone Encounter (Signed)
-----   Message from Vivi Barrack, DPM sent at 03/19/2018 10:18 AM EST ----- No growth- please let her know that the culture did not grow anything. She should have finished the antibiotics from surgery but if there are any concerns about it or any signs of infection I would continue with them as a precaution but clinically it looked clean.

## 2018-03-29 NOTE — Progress Notes (Signed)
Subjective: Kelli Hudson is a 28 y.o. 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  Overall she states that she is doing better.  She is in a cam boot and try and limit activity.  She feels the swelling has improved and she is having no pain.  She denies any fevers, chills, nausea, vomiting.  No calf pain, chest pain, shortness of breath.    Objective: General: No acute distress, AAOx3  DP/PT pulses palpable 2/4, CRT < 3 sec to all digits.  LEFT foot: Incision is well coapted without any evidence of dehiscence with sutures intact.  Overall incision appears to be doing well there is no signs of infection or dehiscence.  There is decreased edema to the surgical site there is no erythema or increase in warmth.  There is no ascending cellulitis.  There is no fluctuation crepitation any malodor.  No tenderness palpation of the surgical site.   No other open lesions or pre-ulcerative lesions.  No pain with calf compression, swelling, warmth, erythema.   Assessment and Plan:  Status post left foot surgery, doing well with no complications   -Treatment options discussed including all alternatives, risks, and complications -Overall she is doing well.  I did leave the sutures intact today.  Antibiotic ointment and a bandage was applied.  She can change the bandage every other day if needed.  Continue cam boot for now encouraged ice elevation. -Must wear cam boot at all times. -Monitor for any clinical signs or symptoms of infection and DVT/PE and directed to call the office immediately should any occur or go to the ER. -Follow-up as scheduled or sooner if any problems arise. In the meantime, encouraged to call the office with any questions, concerns, change in symptoms.   *Likely suture removal next appointment  Ovid Curd, DPM

## 2018-03-30 ENCOUNTER — Ambulatory Visit (INDEPENDENT_AMBULATORY_CARE_PROVIDER_SITE_OTHER): Payer: BLUE CROSS/BLUE SHIELD | Admitting: Podiatry

## 2018-03-30 ENCOUNTER — Encounter: Payer: Self-pay | Admitting: Podiatry

## 2018-03-30 ENCOUNTER — Ambulatory Visit (INDEPENDENT_AMBULATORY_CARE_PROVIDER_SITE_OTHER): Payer: BLUE CROSS/BLUE SHIELD

## 2018-03-30 VITALS — BP 110/71 | HR 118 | Temp 98.7°F | Resp 14

## 2018-03-30 DIAGNOSIS — L97524 Non-pressure chronic ulcer of other part of left foot with necrosis of bone: Secondary | ICD-10-CM | POA: Diagnosis not present

## 2018-03-31 NOTE — Progress Notes (Signed)
Subjective: Kelli Hudson is a 28 y.o. 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  She presents here for suture removal.  Overall she is doing well she having no pain.  She is eager to get back to work.  She is no longer taking any antibiotics.  She feels the incision is healing well and she has no concerns.  She denies any fevers, chills, nausea, vomiting.  No calf pain, chest pain, shortness of breath.    Objective: General: No acute distress, AAOx3  DP/PT pulses palpable 2/4, CRT < 3 sec to all digits.  LEFT foot: Incision is well coapted without any evidence of dehiscence with sutures intact. The incision appears to be doing well there is no signs of infection or dehiscence.  There is decreased edema to the surgical site there is no erythema or increase in warmth.  There is no ascending cellulitis.  There is no fluctuation crepitation any malodor.  No tenderness palpation of the surgical site.  Area in the dorsal aspect the foot that had some skin irritation is much improved as well. No other open lesions or pre-ulcerative lesions.  No pain with calf compression, swelling, warmth, erythema.   Assessment and Plan:  Status post left foot surgery, doing well with no complications   -Treatment options discussed including all alternatives, risks, and complications -X-rays were obtained and reviewed.  No evidence of cortical destruction suggestive of osteomyelitis. -Incision is doing well.  Appears to be healing nicely.  I removed the sutures today without any complications.  After removal incision remained well coapted without any evidence of dehiscence.  Antibiotic ointment is applied as well as Steri-Strips for reinforcement.  Bandage was applied.  She can start to shower tomorrow and I want her to apply a similar bandage with antibiotic ointment.  Continue the cam boot for the rest this week and she is doing well incisions healing she can get back  into a regular shoe this weekend.  I will see her back in about 1 week at that point if she is back into regular shoe without any issues he can return to work. -We will also try to get her inserts for her shoes.  We will schedule her for next week possibly if swelling is improved.  She had some mild swelling still as well hold off on measuring today.  Vivi Barrack DPM

## 2018-04-06 ENCOUNTER — Ambulatory Visit (INDEPENDENT_AMBULATORY_CARE_PROVIDER_SITE_OTHER): Payer: BLUE CROSS/BLUE SHIELD | Admitting: Podiatry

## 2018-04-06 DIAGNOSIS — E1149 Type 2 diabetes mellitus with other diabetic neurological complication: Secondary | ICD-10-CM

## 2018-04-06 DIAGNOSIS — Z89422 Acquired absence of other left toe(s): Secondary | ICD-10-CM

## 2018-04-06 NOTE — Progress Notes (Signed)
Subjective: Kelli Hudson is a 28 y.o. 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  Overall she states that she is doing much better.  Incisions well-healed.  She has been trying to wear some of her shoes at home but put some pressure to the bottom, outside aspect of the foot that she has been wearing flip-flops.  She denies any open sores and she states the swelling is much improved her foot that she had previously.  She has no new concerns.  She not take any pain medication. She denies any fevers, chills, nausea, vomiting.  No calf pain, chest pain, shortness of breath.    Objective: General: No acute distress, AAOx3  DP/PT pulses palpable 2/4, CRT < 3 sec to all digits.  LEFT foot: Incision is well coapted without any evidence of dehiscence and a scar is well formed.  Minimal edema to the surgical site.  Not able to elicit any area of tenderness however subjectively she is getting discomfort on the plantar lateral aspect of the foot along where the fifth metatarsal was cut but there is no significant prominence I can palpate today.  There is no other areas of tenderness bilaterally.  No open sores.  No other open lesions or pre-ulcerative lesions.  No pain with calf compression, swelling, warmth, erythema.   Assessment and Plan:  Status post left foot surgery, doing well with no complications   -Treatment options discussed including all alternatives, risks, and complications -We discussed shoe modifications as well as inserts.  However her insurance has recently changed and she is unfortunately out of network.  I gave her prescription for [facility] to get inserts made.  Hope this will help offload the prominent area. -Long-term we discussed not trimming her own toenails and routine checks given her history of ulcerations to both feet and recent amputation.  However since she is out of network now we will refer her to [facility] in the future for  care. -Discussed daily foot inspection  Vivi Barrack DPM

## 2018-04-08 ENCOUNTER — Encounter: Payer: Self-pay | Admitting: Podiatry

## 2018-04-27 ENCOUNTER — Ambulatory Visit (INDEPENDENT_AMBULATORY_CARE_PROVIDER_SITE_OTHER): Payer: BLUE CROSS/BLUE SHIELD | Admitting: Podiatry

## 2018-04-27 DIAGNOSIS — E1149 Type 2 diabetes mellitus with other diabetic neurological complication: Secondary | ICD-10-CM

## 2018-04-27 DIAGNOSIS — Z89422 Acquired absence of other left toe(s): Secondary | ICD-10-CM

## 2018-05-06 NOTE — Progress Notes (Signed)
Subjective: Kelli Hudson is a 28 y.o. 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  She states that she is doing well she is returned to regular shoes and she is return back to work.  She is still wearing her insert with a filler for the left side.  She denies any open sores and she has no new concerns today. She denies any fevers, chills, nausea, vomiting.  No calf pain, chest pain, shortness of breath.    Objective: General: No acute distress, AAOx3  DP/PT pulses palpable 2/4, CRT < 3 sec to all digits.  LEFT foot: Incision is well coapted without any evidence of dehiscence and a scar is well formed.  The color to the skin is returned.  There is no open sores identified at this time there is no pain.  There is no erythema increased warmth.  There is no clinical signs of infection No other open lesions or pre-ulcerative lesions.  No pain with calf compression, swelling, warmth, erythema.   Assessment and Plan:  Status post left foot surgery, doing well with no complications   -Treatment options discussed including all alternatives, risks, and complications -Overall she is doing much better.  She is back to her regular she is returned to work.  She is awaiting inserts.  I will see her back once more when she gets the inserts make sure they are fitting well.  Vivi Barrack DPM

## 2018-05-10 ENCOUNTER — Ambulatory Visit: Payer: BLUE CROSS/BLUE SHIELD | Admitting: Podiatry

## 2018-05-24 ENCOUNTER — Ambulatory Visit: Payer: BLUE CROSS/BLUE SHIELD | Admitting: Podiatry

## 2018-05-24 ENCOUNTER — Other Ambulatory Visit: Payer: Self-pay

## 2018-05-24 DIAGNOSIS — Z89422 Acquired absence of other left toe(s): Secondary | ICD-10-CM

## 2018-05-24 DIAGNOSIS — E1149 Type 2 diabetes mellitus with other diabetic neurological complication: Secondary | ICD-10-CM

## 2018-05-27 NOTE — Progress Notes (Signed)
Subjective: Kelli Hudson is a 28 y.o. 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  Overall she states that she is doing well and she is back to work.  Unfortunately she cannot the insert because the recent pandemic and they rescheduled her appointment.  She states that she does get some rubbing to the outside aspect the foot with regular shoes but she denies any skin breakdown or calluses.  She denies any increase in edema to the foot. She denies any fevers, chills, nausea, vomiting.  No calf pain, chest pain, shortness of breath.    Objective: General: No acute distress, AAOx3  DP/PT pulses palpable 2/4, CRT < 3 sec to all digits.  LEFT foot: Incision is well coapted without any evidence of dehiscence and a scar is well formed.  There is no significant edema, erythema, increase in warmth.  Mild prominence of the fifth metatarsal distally but there is no skin breakdown or pre-ulcerative callus.  There is normal color to the skin as well as warmth. No other open lesions or pre-ulcerative lesions.  No pain with calf compression, swelling, warmth, erythema.   Assessment and Plan:  Status post left foot surgery, doing well with no complications   -Treatment options discussed including all alternatives, risks, and complications -Overall she is doing much better.  Unfortunately she cannot get the insert for another month.  I did make her some offloading pads to help.  At this time she is doing well from the surgery when discharged from the postoperative care.  Unfortunately her insurance is changed.  I discussed with her that if she needs a referral to another podiatrist to let me know I will be happy to do this for her.  She is in a contact some different offices to see who takes her insurance.  Vivi Barrack DPM

## 2019-06-20 ENCOUNTER — Ambulatory Visit: Payer: BLUE CROSS/BLUE SHIELD | Admitting: Family Medicine

## 2019-07-04 ENCOUNTER — Other Ambulatory Visit: Payer: Self-pay

## 2019-07-05 ENCOUNTER — Ambulatory Visit (INDEPENDENT_AMBULATORY_CARE_PROVIDER_SITE_OTHER): Payer: Managed Care, Other (non HMO) | Admitting: Family Medicine

## 2019-07-05 ENCOUNTER — Encounter: Payer: Self-pay | Admitting: Family Medicine

## 2019-07-05 VITALS — BP 115/76 | HR 110 | Temp 97.8°F | Ht 68.0 in | Wt 153.0 lb

## 2019-07-05 DIAGNOSIS — Z23 Encounter for immunization: Secondary | ICD-10-CM | POA: Diagnosis not present

## 2019-07-05 DIAGNOSIS — Z8639 Personal history of other endocrine, nutritional and metabolic disease: Secondary | ICD-10-CM

## 2019-07-05 DIAGNOSIS — E1165 Type 2 diabetes mellitus with hyperglycemia: Secondary | ICD-10-CM | POA: Diagnosis not present

## 2019-07-05 DIAGNOSIS — F418 Other specified anxiety disorders: Secondary | ICD-10-CM

## 2019-07-05 DIAGNOSIS — E0865 Diabetes mellitus due to underlying condition with hyperglycemia: Secondary | ICD-10-CM

## 2019-07-05 LAB — URINALYSIS, ROUTINE W REFLEX MICROSCOPIC
Bilirubin Urine: NEGATIVE
Hgb urine dipstick: NEGATIVE
Ketones, ur: NEGATIVE
Leukocytes,Ua: NEGATIVE
Nitrite: NEGATIVE
RBC / HPF: NONE SEEN (ref 0–?)
Specific Gravity, Urine: 1.025 (ref 1.000–1.030)
Total Protein, Urine: 30 — AB
Urine Glucose: >=1000 — AB
Urobilinogen, UA: 0.2 (ref 0.0–1.0)
pH: 5 (ref 5.0–8.0)

## 2019-07-05 LAB — COMPREHENSIVE METABOLIC PANEL
ALT: 10 U/L (ref 0–35)
AST: 12 U/L (ref 0–37)
Albumin: 3.4 g/dL — ABNORMAL LOW (ref 3.5–5.2)
Alkaline Phosphatase: 65 U/L (ref 39–117)
BUN: 13 mg/dL (ref 6–23)
CO2: 28 mEq/L (ref 19–32)
Calcium: 9.1 mg/dL (ref 8.4–10.5)
Chloride: 100 mEq/L (ref 96–112)
Creatinine, Ser: 0.78 mg/dL (ref 0.40–1.20)
GFR: 106.35 mL/min (ref >60.00)
Glucose, Bld: 396 mg/dL — ABNORMAL HIGH (ref 70–99)
Potassium: 4.7 mEq/L (ref 3.5–5.1)
Sodium: 134 mEq/L — ABNORMAL LOW (ref 135–145)
Total Bilirubin: 0.5 mg/dL (ref 0.2–1.2)
Total Protein: 6.6 g/dL (ref 6.0–8.3)

## 2019-07-05 LAB — CBC
HCT: 33.8 % — ABNORMAL LOW (ref 36.0–46.0)
Hemoglobin: 11.7 g/dL — ABNORMAL LOW (ref 12.0–15.0)
MCHC: 34.5 g/dL (ref 30.0–36.0)
MCV: 88 fl (ref 78.0–100.0)
Platelets: 351 10*3/uL (ref 150.0–400.0)
RBC: 3.83 Mil/uL — ABNORMAL LOW (ref 3.87–5.11)
RDW: 13.1 % (ref 11.5–15.5)
WBC: 9 10*3/uL (ref 4.0–10.5)

## 2019-07-05 LAB — LDL CHOLESTEROL, DIRECT: Direct LDL: 83 mg/dL

## 2019-07-05 LAB — LIPID PANEL
Cholesterol: 165 mg/dL (ref 0–200)
HDL: 53.1 mg/dL (ref >39.00)
LDL Cholesterol: 75 mg/dL (ref 0–99)
NonHDL: 112.07
Total CHOL/HDL Ratio: 3
Triglycerides: 186 mg/dL — ABNORMAL HIGH (ref 0.0–149.0)
VLDL: 37.2 mg/dL (ref 0.0–40.0)

## 2019-07-05 LAB — MICROALBUMIN / CREATININE URINE RATIO
Creatinine,U: 110.7 mg/dL
Microalb Creat Ratio: 14.5 mg/g (ref 0.0–30.0)
Microalb, Ur: 16 mg/dL — ABNORMAL HIGH (ref 0.0–1.9)

## 2019-07-05 LAB — HEMOGLOBIN A1C: Hgb A1c MFr Bld: 13.9 % — ABNORMAL HIGH (ref 4.6–6.5)

## 2019-07-05 MED ORDER — TRUE METRIX METER W/DEVICE KIT: 1.0000 | PACK | Freq: Four times a day (QID) | 0 refills | Status: DC

## 2019-07-05 MED ORDER — TRUE METRIX BLOOD GLUCOSE TEST VI STRP: ORAL_STRIP | 12 refills | Status: DC

## 2019-07-05 MED ORDER — INSULIN PEN NEEDLE 31G X 5 MM MISC: 1.0000 | Freq: Four times a day (QID) | 0 refills | Status: DC

## 2019-07-05 MED ORDER — INSULIN DETEMIR 100 UNIT/ML FLEXPEN: 18.0000 [IU] | PEN_INJECTOR | Freq: Every day | SUBCUTANEOUS | 5 refills | Status: DC

## 2019-07-05 MED ORDER — INSULIN LISPRO 100 UNIT/ML ~~LOC~~ SOLN: 5.0000 [IU] | Freq: Three times a day (TID) | SUBCUTANEOUS | 5 refills | Status: DC

## 2019-07-05 NOTE — Progress Notes (Signed)
New Patient Office Visit  Subjective:  Patient ID: Kelli Hudson, female    DOB: 07-Jul-1990  Age: 29 y.o. MRN: [id number]  CC:  Chief Complaint  Patient presents with  . Establish Care    New patient, refill on medications. Patient would like to go over ways to help gain weight with diabetes.     HPI Kelli Hudson presents for establishment of care and follow-up of her diabetes. She was diagnosed with type 2 diabetes at age 43. She had weighed 200 lbs. Initially treated with Metformin but has required insulin since that time. She has been lost to follow-up due to insurance change. History of diabetic retinopathy. 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 She continues to take Levemir 18 units nightly. When she checks her morning sugars they are in the 2-300 range. She takes 5 units of Humalog before each meal. She admits that she doesn't eat much or eat that often. She was divorced from her wife back in February. Their relationship had ended well before that time. She has suffered from depression and anxiety.  Past Medical History:  Diagnosis Date  . Diabetes mellitus without complication ([facility])   . Hypertension     Past Surgical History:  Procedure Laterality Date  . AMPUTATION TOE Left 03/10/2018   Procedure: AMPUTATION FIFTH TOE;  Surgeon: Trula Slade, DPM;  Location: [place];  Service: Podiatry;  Laterality: Left;  . ESOPHAGOGASTRODUODENOSCOPY (EGD) WITH PROPOFOL Left 09/08/2015   Procedure: ESOPHAGOGASTRODUODENOSCOPY (EGD) WITH PROPOFOL;  Surgeon: Arta Silence, MD;  Location: [facility] ENDOSCOPY;  Service: Endoscopy;  Laterality: Left;  . WISDOM TOOTH EXTRACTION      Family History  Problem Relation Age of Onset  . Lung cancer Mother   . Bipolar disorder Father     Social History   Socioeconomic History  . Marital status: Significant Other    Spouse name: Not on file  . Number of children: Not on file  . Years of education: Not  on file  . Highest education level: Not on file  Occupational History  . Not on file  Tobacco Use  . Smoking status: Former Research scientist (life sciences)  . Smokeless tobacco: Never Used  Substance and Sexual Activity  . Alcohol use: Yes    Alcohol/week: 1.0 standard drinks    Types: 1 Shots of liquor per week    Comment: occasional   . Drug use: No  . Sexual activity: Yes    Birth control/protection: None    Comment: female partner  Other Topics Concern  . Not on file  Social History Narrative  . Not on file   Social Determinants of Health   Financial Resource Strain:   . Difficulty of Paying Living Expenses:   Food Insecurity:   . Worried About Charity fundraiser in the Last Year:   . Arboriculturist in the Last Year:   Transportation Needs:   . Film/video editor (Medical):   [name] Lack of Transportation (Non-Medical):   Physical Activity:   . Days of Exercise per Week:   . Minutes of Exercise per Session:   Stress:   . Feeling of Stress :   Social Connections:   . Frequency of Communication with Friends and Family:   . Frequency of Social Gatherings with Friends and Family:   . Attends Religious Services:   . Active Member of Clubs or Organizations:   . Attends Archivist Meetings:   [name] Marital Status:  Intimate Partner Violence:   . Fear of Current or Ex-Partner:   . Emotionally Abused:   [name] Physically Abused:   . Sexually Abused:     ROS Review of Systems  Constitutional: Negative.   HENT: Negative.   Eyes: Positive for visual disturbance. Negative for photophobia.  Respiratory: Negative.   Cardiovascular: Negative.   Gastrointestinal: Negative.   Endocrine: Negative for polyphagia and polyuria.  Genitourinary: Negative.   Skin: Negative for pallor and rash.  Allergic/Immunologic: Negative for immunocompromised state.  Neurological: Negative for light-headedness and headaches.  Hematological: Does not bruise/bleed easily.  Psychiatric/Behavioral: Positive for  dysphoric mood. The patient is nervous/anxious.    Depression screen [facility] 2/9 07/05/2019 07/05/2019 09/19/2015  Decreased Interest 1 1 1   Down, Depressed, Hopeless 1 0 0  PHQ - 2 Score 2 1 1   Altered sleeping 1 - 0  Tired, decreased energy 2 - 3  Change in appetite 2 - 0  Feeling bad or failure about yourself  2 - 0  Trouble concentrating 0 - 2  Moving slowly or fidgety/restless 2 - 0  Suicidal thoughts 0 - 0  PHQ-9 Score 11 - 6  Difficult doing work/chores Somewhat difficult - -    Objective:   Today's Vitals: BP 115/76   Pulse (!) 110   Temp 97.8 F (36.6 C) (Tympanic)   Ht 5' 8"  (1.727 m)   Wt 153 lb (69.4 kg)   SpO2 98%   BMI 23.26 kg/m   Physical Exam Vitals and nursing note reviewed.  Constitutional:      General: She is not in acute distress.    Appearance: Normal appearance. She is normal weight. She is not ill-appearing, toxic-appearing or diaphoretic.  HENT:     Head: Normocephalic and atraumatic.     Right Ear: Tympanic membrane, ear canal and external ear normal.     Left Ear: Tympanic membrane normal.     Mouth/Throat:     Mouth: Mucous membranes are moist.     Pharynx: Oropharynx is clear. No oropharyngeal exudate or posterior oropharyngeal erythema.  Eyes:     General: No scleral icterus.       Right eye: No discharge.        Left eye: No discharge.     Extraocular Movements: Extraocular movements intact.     Conjunctiva/sclera: Conjunctivae normal.     Pupils: Pupils are equal, round, and reactive to light.  Cardiovascular:     Rate and Rhythm: Normal rate and regular rhythm.     Pulses:          Dorsalis pedis pulses are 2+ on the right side and 2+ on the left side.       Posterior tibial pulses are 1+ on the right side and 1+ on the left side.  Pulmonary:     Effort: Pulmonary effort is normal.     Breath sounds: Normal breath sounds.  Abdominal:     General: Bowel sounds are normal.  Musculoskeletal:     Cervical back: No rigidity or  tenderness.     Left lower leg: No edema.  Lymphadenopathy:     Cervical: No cervical adenopathy.  Skin:    General: Skin is warm and dry.  Neurological:     Mental Status: She is alert and oriented to person, place, and time.  Psychiatric:        Mood and Affect: Mood normal.        Behavior: Behavior normal.    Diabetic Foot  Exam - Simple   Simple Foot Form Visual Inspection See comments: Yes Sensation Testing Pulse Check Posterior Tibialis and Dorsalis pulse intact bilaterally: Yes Comments Missing 5 digit of left foot.  Feet are standard.      Assessment & Plan:   Problem List Items Addressed This Visit      Endocrine   Uncontrolled diabetes mellitus ([place])   Relevant Medications   insulin lispro (HUMALOG) 100 UNIT/ML injection   insulin detemir (LEVEMIR) 100 UNIT/ML FlexPen   Insulin Pen Needle 31G X 5 MM MISC   glucose blood (TRUE METRIX BLOOD GLUCOSE TEST) test strip   Blood Glucose Monitoring Suppl (TRUE METRIX METER) w/Device KIT   Other Relevant Orders   CBC   Comprehensive metabolic panel   Hemoglobin A1c   Lipid panel   LDL cholesterol, direct   Urinalysis, Routine w reflex microscopic   Microalbumin / creatinine urine ratio   Ambulatory referral to Endocrinology   Ambulatory referral to Podiatry     Other   Depression with anxiety   Relevant Orders   Ambulatory referral to Psychology   Need for Tdap vaccination - Primary   Relevant Orders   Tdap vaccine greater than or equal to 7yo IM (Completed)    Other Visit Diagnoses    History of diabetic retinopathy       Relevant Orders   Ambulatory referral to Ophthalmology      Outpatient Encounter Medications as of 07/05/2019  Medication Sig  . insulin detemir (LEVEMIR) 100 UNIT/ML FlexPen Inject 18 Units into the skin daily.  . Insulin Pen Needle 31G X 5 MM MISC 1 Device by Does not apply route QID. For use with insulin pens  . norethindrone-ethinyl estradiol (LOESTRIN) 1-20 MG-MCG tablet Take  1 tablet by mouth daily.  . [DISCONTINUED] insulin aspart (NOVOLOG) 100 UNIT/ML FlexPen Inject 5 Units into the skin 3 (three) times daily with meals.  . [DISCONTINUED] Insulin Detemir (LEVEMIR) 100 UNIT/ML Pen Inject 18 Units into the skin daily.  . [DISCONTINUED] Insulin Pen Needle 31G X 5 MM MISC 1 Device by Does not apply route QID. For use with insulin pens  . Blood Glucose Monitoring Suppl (TRUE METRIX METER) w/Device KIT 1 Device by Does not apply route 4 (four) times daily.  . cephALEXin (KEFLEX) 500 MG capsule Take 1 capsule (500 mg total) by mouth 4 (four) times daily. (Patient not taking: Reported on 07/05/2019)  . cephALEXin (KEFLEX) 500 MG capsule Take 1 capsule (500 mg total) by mouth 4 (four) times daily. (Patient not taking: Reported on 07/05/2019)  . glucose blood (TRUE METRIX BLOOD GLUCOSE TEST) test strip Use as instructed  . ibuprofen (ADVIL,MOTRIN) 200 MG tablet Take 400-600 mg by mouth every 6 (six) hours as needed for headache or moderate pain.   [name] insulin lispro (HUMALOG) 100 UNIT/ML injection Inject 0.05 mLs (5 Units total) into the skin 3 (three) times daily with meals.  . promethazine (PHENERGAN) 25 MG tablet Take 1 tablet (25 mg total) by mouth every 8 (eight) hours as needed for nausea or vomiting. (Patient not taking: Reported on 07/05/2019)  . TRUEPLUS LANCETS 28G MISC assist with checking blood sugar TID and qhs (Patient not taking: Reported on 07/05/2019)  . [DISCONTINUED] Blood Glucose Monitoring Suppl (TRUE METRIX METER) w/Device KIT 1 Device by Does not apply route 4 (four) times daily. (Patient not taking: Reported on 07/05/2019)  . [DISCONTINUED] glucose blood (TRUE METRIX BLOOD GLUCOSE TEST) test strip Use as instructed (Patient not taking: Reported on  07/05/2019)   No facility-administered encounter medications on file as of 07/05/2019.    Follow-up: Return in about 3 months (around 10/05/2019).   Would like to go for talking therapy for treatment of her anxiety  and depression at this point. Follow-up in one 3 months.  Libby Maw, MD

## 2019-07-25 ENCOUNTER — Ambulatory Visit (INDEPENDENT_AMBULATORY_CARE_PROVIDER_SITE_OTHER): Payer: 59 | Admitting: Psychologist

## 2019-07-25 DIAGNOSIS — F320 Major depressive disorder, single episode, mild: Secondary | ICD-10-CM | POA: Diagnosis not present

## 2019-07-28 ENCOUNTER — Ambulatory Visit: Payer: BLUE CROSS/BLUE SHIELD | Admitting: Podiatry

## 2019-08-12 ENCOUNTER — Ambulatory Visit: Payer: Self-pay | Admitting: Psychologist

## 2019-09-05 ENCOUNTER — Ambulatory Visit: Payer: Medicaid Other | Admitting: Podiatry

## 2019-10-03 ENCOUNTER — Emergency Department (HOSPITAL_COMMUNITY): Payer: Managed Care, Other (non HMO)

## 2019-10-03 ENCOUNTER — Emergency Department (HOSPITAL_COMMUNITY)
Admission: EM | Admit: 2019-10-03 | Discharge: 2019-10-04 | Disposition: A | Discharge status: Home / Self Care | Payer: Managed Care, Other (non HMO) | Attending: Emergency Medicine | Admitting: Emergency Medicine

## 2019-10-03 ENCOUNTER — Encounter (HOSPITAL_COMMUNITY): Payer: Self-pay | Admitting: Emergency Medicine

## 2019-10-03 ENCOUNTER — Other Ambulatory Visit: Payer: Self-pay

## 2019-10-03 ENCOUNTER — Telehealth: Payer: Self-pay | Admitting: Family Medicine

## 2019-10-03 DIAGNOSIS — R079 Chest pain, unspecified: Secondary | ICD-10-CM | POA: Insufficient documentation

## 2019-10-03 DIAGNOSIS — I10 Essential (primary) hypertension: Secondary | ICD-10-CM | POA: Diagnosis not present

## 2019-10-03 DIAGNOSIS — R112 Nausea with vomiting, unspecified: Secondary | ICD-10-CM | POA: Diagnosis present

## 2019-10-03 DIAGNOSIS — Z87891 Personal history of nicotine dependence: Secondary | ICD-10-CM | POA: Diagnosis not present

## 2019-10-03 DIAGNOSIS — E1110 Type 2 diabetes mellitus with ketoacidosis without coma: Secondary | ICD-10-CM | POA: Insufficient documentation

## 2019-10-03 DIAGNOSIS — R1084 Generalized abdominal pain: Secondary | ICD-10-CM | POA: Insufficient documentation

## 2019-10-03 DIAGNOSIS — Z794 Long term (current) use of insulin: Secondary | ICD-10-CM | POA: Insufficient documentation

## 2019-10-03 DIAGNOSIS — R000 Tachycardia, unspecified: Secondary | ICD-10-CM

## 2019-10-03 DIAGNOSIS — Z20822 Contact with and (suspected) exposure to covid-19: Secondary | ICD-10-CM | POA: Insufficient documentation

## 2019-10-03 DIAGNOSIS — R110 Nausea: Secondary | ICD-10-CM

## 2019-10-03 DIAGNOSIS — R0602 Shortness of breath: Secondary | ICD-10-CM | POA: Insufficient documentation

## 2019-10-03 LAB — COMPREHENSIVE METABOLIC PANEL
ALT: 19 U/L (ref 0–44)
AST: 18 U/L (ref 15–41)
Albumin: 3.7 g/dL (ref 3.5–5.0)
Alkaline Phosphatase: 62 U/L (ref 38–126)
Anion gap: 12 (ref 5–15)
BUN: 25 mg/dL — ABNORMAL HIGH (ref 6–20)
CO2: 25 mmol/L (ref 22–32)
Calcium: 9.8 mg/dL (ref 8.9–10.3)
Chloride: 101 mmol/L (ref 98–111)
Creatinine, Ser: 0.91 mg/dL (ref 0.44–1.00)
GFR calc Af Amer: >60 mL/min (ref >60)
GFR calc non Af Amer: >60 mL/min (ref >60)
Glucose, Bld: 256 mg/dL — ABNORMAL HIGH (ref 70–99)
Potassium: 3.8 mmol/L (ref 3.5–5.1)
Sodium: 138 mmol/L (ref 135–145)
Total Bilirubin: 1 mg/dL (ref 0.3–1.2)
Total Protein: 8.1 g/dL (ref 6.5–8.1)

## 2019-10-03 LAB — CBG MONITORING, ED
Glucose-Capillary: 341 mg/dL — ABNORMAL HIGH (ref 70–99)
Glucose-Capillary: 272 mg/dL — ABNORMAL HIGH (ref 70–99)

## 2019-10-03 LAB — BLOOD GAS, VENOUS
Acid-Base Excess: 0.6 mmol/L (ref 0.0–2.0)
Bicarbonate: 25.5 mmol/L (ref 20.0–28.0)
O2 Saturation: 34 %
Patient temperature: 98.6
pCO2, Ven: 44.3 mmHg (ref 44.0–60.0)
pH, Ven: 7.378 (ref 7.250–7.430)
pO2, Ven: <31 mmHg — CL (ref 32.0–45.0)

## 2019-10-03 LAB — I-STAT BETA HCG BLOOD, ED (MC, WL, AP ONLY): I-stat hCG, quantitative: <5 m[IU]/mL (ref <5)

## 2019-10-03 LAB — URINALYSIS, ROUTINE W REFLEX MICROSCOPIC
Bilirubin Urine: NEGATIVE
Glucose, UA: >=500 mg/dL — AB
Ketones, ur: 20 mg/dL — AB
Leukocytes,Ua: NEGATIVE
Nitrite: NEGATIVE
Protein, ur: 100 mg/dL — AB
Specific Gravity, Urine: 1.033 — ABNORMAL HIGH (ref 1.005–1.030)
pH: 5 (ref 5.0–8.0)

## 2019-10-03 LAB — CBC
HCT: 38.4 % (ref 36.0–46.0)
Hemoglobin: 13.1 g/dL (ref 12.0–15.0)
MCH: 29.9 pg (ref 26.0–34.0)
MCHC: 34.1 g/dL (ref 30.0–36.0)
MCV: 87.7 fL (ref 80.0–100.0)
Platelets: 408 10*3/uL — ABNORMAL HIGH (ref 150–400)
RBC: 4.38 MIL/uL (ref 3.87–5.11)
RDW: 12.3 % (ref 11.5–15.5)
WBC: 8.5 10*3/uL (ref 4.0–10.5)
nRBC: 0 % (ref 0.0–0.2)

## 2019-10-03 LAB — SARS CORONAVIRUS 2 BY RT PCR (HOSPITAL ORDER, PERFORMED IN ~~LOC~~ HOSPITAL LAB): SARS Coronavirus 2: NEGATIVE

## 2019-10-03 LAB — LIPASE, BLOOD: Lipase: 23 U/L (ref 11–51)

## 2019-10-03 MED ORDER — SODIUM CHLORIDE 0.9 % IV BOLUS (SEPSIS)
1000.0000 mL | Freq: Once | INTRAVENOUS | Status: AC
  Administered 2019-10-03: 1000 mL via INTRAVENOUS

## 2019-10-03 MED ORDER — ONDANSETRON 4 MG PO TBDP
4.0000 mg | ORAL_TABLET | Freq: Once | ORAL | Status: AC | PRN
  Administered 2019-10-03: 4 mg via ORAL
  Filled 2019-10-03: qty 1

## 2019-10-03 MED ORDER — METRONIDAZOLE 500 MG PO TABS
500.0000 mg | ORAL_TABLET | Freq: Three times a day (TID) | ORAL | 0 refills | Status: AC
Start: 2019-10-04 — End: 2019-10-09

## 2019-10-03 MED ORDER — LORAZEPAM 2 MG/ML IJ SOLN
1.0000 mg | Freq: Once | INTRAMUSCULAR | Status: DC
  Filled 2019-10-03: qty 1

## 2019-10-03 MED ORDER — PROCHLORPERAZINE EDISYLATE 10 MG/2ML IJ SOLN
10.0000 mg | Freq: Once | INTRAMUSCULAR | Status: AC
  Administered 2019-10-03: 10 mg via INTRAVENOUS
  Filled 2019-10-03: qty 2

## 2019-10-03 MED ORDER — SODIUM CHLORIDE 0.9 % IV SOLN
1000.0000 mL | INTRAVENOUS | Status: DC
  Administered 2019-10-03: 1000 mL via INTRAVENOUS

## 2019-10-03 MED ORDER — ONDANSETRON 4 MG PO TBDP: 4.0000 mg | ORAL_TABLET | Freq: Three times a day (TID) | ORAL | 0 refills | Status: DC | PRN

## 2019-10-03 MED ORDER — FENTANYL CITRATE (PF) 100 MCG/2ML IJ SOLN
50.0000 ug | Freq: Once | INTRAMUSCULAR | Status: AC
  Administered 2019-10-03: 50 ug via INTRAVENOUS
  Filled 2019-10-03: qty 2

## 2019-10-03 MED ORDER — DROPERIDOL 2.5 MG/ML IJ SOLN
2.5000 mg | Freq: Once | INTRAMUSCULAR | Status: AC
  Administered 2019-10-03: 2.5 mg via INTRAVENOUS
  Filled 2019-10-03: qty 2

## 2019-10-03 MED ORDER — PANTOPRAZOLE SODIUM 40 MG IV SOLR
40.0000 mg | Freq: Once | INTRAVENOUS | Status: AC
  Administered 2019-10-03: 40 mg via INTRAVENOUS
  Filled 2019-10-03: qty 40

## 2019-10-03 MED ORDER — HYDROMORPHONE HCL 1 MG/ML IJ SOLN
1.0000 mg | Freq: Once | INTRAMUSCULAR | Status: AC
  Administered 2019-10-04: 1 mg via INTRAVENOUS
  Filled 2019-10-03: qty 1

## 2019-10-03 MED ORDER — CIPROFLOXACIN IN D5W 400 MG/200ML IV SOLN
400.0000 mg | Freq: Once | INTRAVENOUS | Status: AC
  Administered 2019-10-03: 400 mg via INTRAVENOUS
  Filled 2019-10-03: qty 200

## 2019-10-03 MED ORDER — METRONIDAZOLE IN NACL 5-0.79 MG/ML-% IV SOLN
500.0000 mg | Freq: Once | INTRAVENOUS | Status: AC
  Administered 2019-10-03: 500 mg via INTRAVENOUS
  Filled 2019-10-03: qty 100

## 2019-10-03 MED ORDER — IOHEXOL 300 MG/ML  SOLN
100.0000 mL | Freq: Once | INTRAMUSCULAR | Status: AC | PRN
  Administered 2019-10-03: 100 mL via INTRAVENOUS

## 2019-10-03 MED ORDER — CIPROFLOXACIN HCL 500 MG PO TABS
500.0000 mg | ORAL_TABLET | Freq: Two times a day (BID) | ORAL | 0 refills | Status: AC
Start: 2019-10-04 — End: 2019-10-09

## 2019-10-03 NOTE — ED Provider Notes (Signed)
[facility] DEPT Provider Note   CSN: [id number] Arrival date & time: 10/03/19  1438     History Chief Complaint  Patient presents with  . Shortness of Breath  . Emesis    Kelli Hudson is a 29 y.o. female with past medical history of hypertension, type 1 diabetes, gastroparesis, presenting to the ED with complaint of nausea vomiting that began yesterday.  She states she has had multiple episodes of nonbloody nonbilious emesis overnight.  She developed a chest pain with generalized abdominal pain as well.   Her pain is worse with vomiting. She feels short of breath. She states she feels like she may be in DKA.  Her blood sugars have been running high over the last week.  She denies diarrhea, pelvic complaints, urinary sx, fevers, cough. Denies known COVID exposures. She has not received COVID vaccination.  The history is provided by the patient.       Past Medical History:  Diagnosis Date  . Diabetes mellitus without complication ([place])   . Hypertension     Patient Active Problem List   Diagnosis Date Noted  . Depression with anxiety 07/05/2019  . Need for Tdap vaccination 07/05/2019  . Diabetic foot ulcer ([place]) 02/21/2018  . Skin ulcer of left foot with fat layer exposed ([place]) 09/24/2017  . Diabetic polyneuropathy associated with type 2 diabetes mellitus ([place]) 09/24/2017  . Pain in right foot 09/24/2017  . Diabetic ketoacidosis without coma associated with type 2 diabetes mellitus ([place]) 01/02/2016  . Gastroparesis   . Leukocytosis   . Hyperglycemia   . Abdominal pain   . Diabetes mellitus due to underlying condition, uncontrolled, with hyperosmolarity without coma, without long-term current use of insulin ([place])   . Increased frequency of urination 06/14/2011  . UTI (urinary tract infection) 06/14/2011  . THYROMEGALY 10/05/2009  . Uncontrolled diabetes mellitus ([place]) 10/05/2009  . Essential hypertension 10/05/2009    Past Surgical  History:  Procedure Laterality Date  . AMPUTATION TOE Left 03/10/2018   Procedure: AMPUTATION FIFTH TOE;  Surgeon: Trula Slade, DPM;  Location: [facility];  Service: Podiatry;  Laterality: Left;  . ESOPHAGOGASTRODUODENOSCOPY (EGD) WITH PROPOFOL Left 09/08/2015   Procedure: ESOPHAGOGASTRODUODENOSCOPY (EGD) WITH PROPOFOL;  Surgeon: Arta Silence, MD;  Location: [facility] ENDOSCOPY;  Service: Endoscopy;  Laterality: Left;  . WISDOM TOOTH EXTRACTION       OB History   No obstetric history on file.     Family History  Problem Relation Age of Onset  . Lung cancer Mother   . Bipolar disorder Father     Social History   Tobacco Use  . Smoking status: Former Research scientist (life sciences)  . Smokeless tobacco: Never Used  Vaping Use  . Vaping Use: Never used  Substance Use Topics  . Alcohol use: Yes    Alcohol/week: 1.0 standard drink    Types: 1 Shots of liquor per week    Comment: occasional   . Drug use: No    Home Medications Prior to Admission medications   Medication Sig Start Date End Date Taking? Authorizing Provider  Blood Glucose Monitoring Suppl (TRUE METRIX METER) w/Device KIT 1 Device by Does not apply route 4 (four) times daily. 07/05/19   Libby Maw, MD  cephALEXin (KEFLEX) 500 MG capsule Take 1 capsule (500 mg total) by mouth 4 (four) times daily. Patient not taking: Reported on 5/78/4696 2/95/28   Delora Fuel, MD  cephALEXin (KEFLEX) 500 MG capsule Take 1 capsule (500 mg total) by  mouth 4 (four) times daily. Patient not taking: Reported on 07/05/2019 03/15/18   Trula Slade, DPM  glucose blood (TRUE METRIX BLOOD GLUCOSE TEST) test strip Use as instructed 07/05/19   Libby Maw, MD  ibuprofen (ADVIL,MOTRIN) 200 MG tablet Take 400-600 mg by mouth every 6 (six) hours as needed for headache or moderate pain.     [provider]  insulin detemir (LEVEMIR) 100 UNIT/ML FlexPen Inject 18 Units into the skin daily. 07/05/19   Libby Maw,  MD  insulin lispro (HUMALOG) 100 UNIT/ML injection Inject 0.05 mLs (5 Units total) into the skin 3 (three) times daily with meals. 07/05/19   Libby Maw, MD  Insulin Pen Needle 31G X 5 MM MISC 1 Device by Does not apply route QID. For use with insulin pens 07/05/19   Libby Maw, MD  norethindrone-ethinyl estradiol (LOESTRIN) 1-20 MG-MCG tablet Take 1 tablet by mouth daily.    [provider]  promethazine (PHENERGAN) 25 MG tablet Take 1 tablet (25 mg total) by mouth every 8 (eight) hours as needed for nausea or vomiting. Patient not taking: Reported on 07/05/2019 03/10/18   Trula Slade, DPM  TRUEPLUS LANCETS 28G MISC assist with checking blood sugar TID and qhs Patient not taking: Reported on 07/05/2019 09/10/15   Brayton Caves, PA-C  insulin aspart (NOVOLOG) 100 UNIT/ML FlexPen Inject 5 Units into the skin 3 (three) times daily with meals. 02/23/18 07/05/19  Donne Hazel, MD    Allergies    Patient has no known allergies.  Review of Systems   Review of Systems  Constitutional: Negative for chills and fever.  Respiratory: Positive for shortness of breath. Negative for cough.   Cardiovascular: Positive for chest pain.  Gastrointestinal: Positive for abdominal pain, nausea and vomiting. Negative for constipation and diarrhea.  All other systems reviewed and are negative.   Physical Exam Updated Vital Signs BP (!) 170/106   Pulse (!) 128   Temp 98.1 F (36.7 C) (Oral)   Resp 20   LMP 10/03/2019 Comment: Neg hcg 10/03/19  SpO2 100%   Physical Exam Vitals and nursing note reviewed.  Constitutional:      Appearance: She is well-developed. She is ill-appearing.  HENT:     Head: Normocephalic and atraumatic.  Eyes:     Conjunctiva/sclera: Conjunctivae normal.  Cardiovascular:     Rate and Rhythm: Normal rate and regular rhythm.  Pulmonary:     Effort: Pulmonary effort is normal.     Breath sounds: Normal breath sounds.  Chest:     Chest wall:  Tenderness present.  Abdominal:     General: Bowel sounds are normal.     Palpations: Abdomen is soft.     Tenderness: There is generalized abdominal tenderness. There is guarding. There is no rebound.  Skin:    General: Skin is warm.  Neurological:     Mental Status: She is alert.  Psychiatric:        Behavior: Behavior normal.     ED Results / Procedures / Treatments   Labs (all labs ordered are listed, but only abnormal results are displayed) Labs Reviewed  COMPREHENSIVE METABOLIC PANEL - Abnormal; Notable for the following components:      Result Value   Glucose, Bld 256 (*)    BUN 25 (*)    All other components within normal limits  CBC - Abnormal; Notable for the following components:   Platelets 408 (*)    All other components  within normal limits  BLOOD GAS, VENOUS - Abnormal; Notable for the following components:   pO2, Ven <31.0 (*)    All other components within normal limits  CBG MONITORING, ED - Abnormal; Notable for the following components:   Glucose-Capillary 341 (*)    All other components within normal limits  SARS CORONAVIRUS 2 BY RT PCR (HOSPITAL ORDER, [facility])  LIPASE, BLOOD  URINALYSIS, ROUTINE W REFLEX MICROSCOPIC  I-STAT BETA HCG BLOOD, ED ([facility], [facility], [facility] ONLY)  CBG MONITORING, ED    EKG EKG Interpretation  Date/Time:  Monday October 03 2019 14:55:19 EDT Ventricular Rate:  124 PR Interval:    QRS Duration: 69 QT Interval:  283 QTC Calculation: 407 R Axis:   67 Text Interpretation: Sinus tachycardia LAE, consider biatrial enlargement Abnormal Q suggests anterior infarct Baseline wander in lead(s) V1 V5 Since last tracing rate faster Confirmed by Dorie Rank [phone]) on 10/03/2019 4:39:37 PM   Radiology CT Abdomen Pelvis W Contrast  Result Date: 10/03/2019 CLINICAL DATA:  29 year old female with acute abdominal pain. EXAM: CT ABDOMEN AND PELVIS WITH CONTRAST TECHNIQUE: Multidetector CT imaging of the abdomen and  pelvis was performed using the standard protocol following bolus administration of intravenous contrast. CONTRAST:  176m OMNIPAQUE IOHEXOL 300 MG/ML  SOLN COMPARISON:  CT abdomen pelvis dated 09/04/2015. FINDINGS: Lower chest: The visualized lung bases are clear. No intra-abdominal free air or free fluid. Hepatobiliary: No focal liver abnormality is seen. No gallstones, gallbladder wall thickening, or biliary dilatation. Pancreas: Unremarkable. No pancreatic ductal dilatation or surrounding inflammatory changes. Spleen: Normal in size without focal abnormality. Adrenals/Urinary Tract: The adrenal glands unremarkable. There is no hydronephrosis on either side. The visualized ureters and urinary bladder appear unremarkable. Stomach/Bowel: There is no bowel obstruction. There is diffuse thickened appearance of the colon which may be related to underdistention. Mild colitis is not excluded clinical correlation is recommended. The appendix is normal. Vascular/Lymphatic: The abdominal aorta and IVC unremarkable. No portal venous gas. There is no adenopathy. Reproductive: The uterus is anteverted. There is a 12 mm anterior fundal hypodense lesion, likely a fibroid. The ovaries are grossly unremarkable. Other: None Musculoskeletal: No acute or significant osseous findings. IMPRESSION: Underdistention of the colon versus mild colitis. Clinical correlation is recommended. No bowel obstruction. Normal appendix. Electronically Signed   By: AAnner CreteM.D.   On: 10/03/2019 20:22   DG Abdomen Acute W/Chest  Result Date: 10/03/2019 CLINICAL DATA:  Short of breath and vomiting, diabetes EXAM: DG ABDOMEN ACUTE W/ 1V CHEST COMPARISON:  None. FINDINGS: Supine and upright frontal views of the abdomen as well as an upright frontal view of the chest are obtained. The cardiac silhouette is unremarkable. No airspace disease, effusion, or pneumothorax. No bowel obstruction or ileus. No free gas in the greater peritoneal sac. No  masses or abnormal calcifications. No acute bony abnormalities. IMPRESSION: 1. Unremarkable abdominal series. Electronically Signed   By: MRanda NgoM.D.   On: 10/03/2019 17:49    Procedures Procedures (including critical care time)  Medications Ordered in ED Medications  sodium chloride 0.9 % bolus 1,000 mL (0 mLs Intravenous Stopped 10/03/19 2000)    Followed by  0.9 %  sodium chloride infusion (1,000 mLs Intravenous New Bag/Given 10/03/19 1730)  ciprofloxacin (CIPRO) IVPB 400 mg (has no administration in time range)  metroNIDAZOLE (FLAGYL) IVPB 500 mg (has no administration in time range)  ondansetron (ZOFRAN-ODT) disintegrating tablet 4 mg (4 mg Oral Given 10/03/19 1459)  fentaNYL (SUBLIMAZE)  injection 50 mcg (50 mcg Intravenous Given 10/03/19 1737)  prochlorperazine (COMPAZINE) injection 10 mg (10 mg Intravenous Given 10/03/19 1957)  iohexol (OMNIPAQUE) 300 MG/ML solution 100 mL (100 mLs Intravenous Contrast Given 10/03/19 2004)  pantoprazole (PROTONIX) injection 40 mg (40 mg Intravenous Given 10/03/19 2122)  droperidol (INAPSINE) 2.5 MG/ML injection 2.5 mg (2.5 mg Intravenous Given 10/03/19 2128)    ED Course  I have reviewed the triage vital signs and the nursing notes.  Pertinent labs & imaging results that were available during my care of the patient were reviewed by me and considered in my medical decision making (see chart for details).  Clinical Course as of Oct 03 2130  Mon Oct 03, 2019  1925 Pt re-evaluated, nausea returns. She continues to look ill. Pt is able to provide more history at this time. She states she has not felt this bad with DKA in the past, no known sick contacts. Abd pain and chest pain began after vomiting started. Denies marijuana use. Gen abd pain and TTP. Pt is not in DKA. Will obtain CT imaging for further evaluation.   [JR]    Clinical Course User Index [JR] Zyion Leidner, [place] N, PA-C   MDM Rules/Calculators/A&P                          Pt with PMHx  T1DM, gastroparesis, presenting with nausea vomiting, generalized abdominal pain and chest pain began yesterday.  On initial evaluation, she is ill-appearing, actively retching. VS with tachycardia, hypertension.  Antiemetics, IV fluids, labs ordered.  CBG is 341 initially.  Metabolic panel reveals normal bicarb and gap, VBG with normal pH.  CBC without leukocytosis, lipase within normal limits.  Negative hCG.  Acute abdominal series film is negative.  On reevaluation, patient symptoms have had little improvement, she continues to look ill with generalized abdominal tenderness and reproducible chest pain with palpation.    Patient remains tachycardic despite IV fluids.  CT scan obtained for further evaluation, reveals possible mild colitis.  Patient discussed with and evaluated by Dr. Langston Masker.  IV Cipro Flagyl ordered, droperidol for intractable vomiting.  Differential diagnosis includes acute colitis versus gastroparesis versus viral gastroenteritis.  Care assumed by Dr. Langston Masker at shift change pending re-evaluation and disposition.   Final Clinical Impression(s) / ED Diagnoses Final diagnoses:  None    Rx / DC Orders ED Discharge Orders    None       Taija Mathias, [place] N, PA-C 10/03/19 2132    Wyvonnia Dusky, MD 10/04/19 1031

## 2019-10-03 NOTE — ED Notes (Signed)
Date and time results received: 10/03/19 4:54 PM  (use smartphrase ".now" to insert current time)  Test: VBG O2 Critical Value: 25.0  Name of Provider Notified: [place] PA  Orders Received? Or Actions Taken?: Orders Received - See Orders for details

## 2019-10-03 NOTE — Telephone Encounter (Signed)
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 Please advise.

## 2019-10-03 NOTE — ED Provider Notes (Signed)
MSE was initiated and I personally evaluated the patient and placed orders (if any) at  4:18 PM on October 03, 2019.  Pt evaluated by me as part of triage process.  Pt with nausea, vomiting, abdominal pain.  Pt has history of diabetes.  Blood sugar has been running high.  Pt not sure if she has had DKA before.  General:  Appears ill, clear emesis Heart tachycardic Lung CTA Abd: scaphoid, diffuse tenderness  Abd pain lab protocol ordered.  Will add on VBG.   DKA is a concern.  Zofran for nausea.  MSE process initiated.  Pt will need complete evaluation by another provider.   Linwood Dibbles, MD 10/03/19 [phone]

## 2019-10-03 NOTE — Discharge Instructions (Addendum)
You had an extensive work up in the ER tonight for your chest pain, abdominal pain, nausea and vomiting.  Your bloodwork did not show signs of diabetic ketoacidosis (DKA).  Your CT scan of the abdomen DID show some inflammation around your bowels called colitis.  For this we gave your IV antibiotics in the ER.  I prescribed you 5 more days of these antibiotics to take at home, beginning tomorrow morning.  Take the full course as prescribed. Do NOT drink alcohol while taking these medications, as it will make you sick.  In the ER your pain had improved, and you could drink some fluids.  For the next 3 days, stick to a fluid diet.  Do NOT eat solid or heavy food.  Drink soups, broths, yogurt, smoothies, applesauce, etc only.  Try to avoid smoking, including marijuana, which can trigger cyclical vomiting in some patients.

## 2019-10-03 NOTE — Telephone Encounter (Signed)
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 Doreene Burke can call her something in to to the pharmacy, please advise. CB is [phone]

## 2019-10-03 NOTE — ED Triage Notes (Signed)
Pt c/o sob and vomiting since yesterday. Reports diabetic and feeling shaky. Pt adds chest pains and abd pains.

## 2019-10-04 MED ORDER — ONDANSETRON HCL 4 MG PO TABS: 4.0000 mg | ORAL_TABLET | Freq: Three times a day (TID) | ORAL | 0 refills | Status: DC | PRN

## 2019-10-04 NOTE — ED Notes (Signed)
Pt states she drank "a little water" and was able to tolerate it without emesis.

## 2019-10-04 NOTE — ED Provider Notes (Signed)
Patient able to hold down fluids. No CT evidence of significant acute pathology.  Patient remains tachycardic but has a history of persistent sinus tachcardia on previous visits.     Perez Dirico, Jonny Ruiz, MD 10/04/19 [phone]

## 2019-10-05 ENCOUNTER — Other Ambulatory Visit: Payer: Self-pay

## 2019-10-05 ENCOUNTER — Emergency Department (HOSPITAL_COMMUNITY)
Admission: EM | Admit: 2019-10-05 | Discharge: 2019-10-05 | Discharge status: Against Medical Advice | Payer: Managed Care, Other (non HMO)

## 2019-10-07 DIAGNOSIS — A048 Other specified bacterial intestinal infections: Secondary | ICD-10-CM | POA: Insufficient documentation

## 2019-12-23 ENCOUNTER — Ambulatory Visit (INDEPENDENT_AMBULATORY_CARE_PROVIDER_SITE_OTHER): Payer: 59

## 2019-12-23 ENCOUNTER — Other Ambulatory Visit: Payer: Self-pay

## 2019-12-23 ENCOUNTER — Ambulatory Visit (INDEPENDENT_AMBULATORY_CARE_PROVIDER_SITE_OTHER): Payer: Self-pay | Admitting: Podiatry

## 2019-12-23 DIAGNOSIS — L97501 Non-pressure chronic ulcer of other part of unspecified foot limited to breakdown of skin: Secondary | ICD-10-CM

## 2019-12-23 DIAGNOSIS — E08621 Diabetes mellitus due to underlying condition with foot ulcer: Secondary | ICD-10-CM

## 2019-12-23 DIAGNOSIS — E1149 Type 2 diabetes mellitus with other diabetic neurological complication: Secondary | ICD-10-CM

## 2019-12-23 DIAGNOSIS — E0800 Diabetes mellitus due to underlying condition with hyperosmolarity without nonketotic hyperglycemic-hyperosmolar coma (NKHHC): Secondary | ICD-10-CM

## 2019-12-23 MED ORDER — DOXYCYCLINE HYCLATE 100 MG PO TABS
100.0000 mg | ORAL_TABLET | Freq: Two times a day (BID) | ORAL | 0 refills | Status: DC
Start: 2019-12-23 — End: 2020-01-30

## 2019-12-23 MED ORDER — MUPIROCIN 2 % EX OINT
1.0000 | TOPICAL_OINTMENT | Freq: Two times a day (BID) | CUTANEOUS | 2 refills | Status: DC
Start: 2019-12-23 — End: 2020-04-04

## 2019-12-26 NOTE — Progress Notes (Signed)
Subjective: 29 year old female presents the office today for concerns of a wound to the left fourth toe.  She said that she noticed this yesterday.  She did have an injury recently fell on her foot a couple days prior when she looked at her foot she did not see any open sores.  She states that she was in the shower she noticed her toe burning that is when she noticed the wound.  Denies any pus drainage.  Denies any systemic complaints such as fevers, chills, nausea, vomiting. No acute changes since last appointment, and no other complaints at this time.  States her A1c is down to 10 (was a 14).   Objective: AAO x3, NAD DP/PT pulses palpable bilaterally, CRT less than 3 seconds Sensation decreased with [facility] monofilament.   Previous partial fifth amputation left side.   Ulceration of the distal aspect of the left fourth toe on the DIPJ.  There is edema and erythema to the toe itself there is no ascending cellulitis there is no area of fluctuance or crepitation but there is no malodor.  With measures 0.5 x 0.5 x 0.1 cm there is no probing to bone, undermining or tunneling.  Adductovarus present of the fourth toe. No pain with calf compression, swelling, warmth, erythema  Assessment: Ulceration with localized infection left fourth toe, uncontrolled diabetes with neuropathy  Plan: -All treatment options discussed with the patient including all alternatives, risks, complications.  -X-rays obtained reviewed.  No definitive evidence of acute fracture, osteomyelitis.  Edema present of the fourth digit. -Sharp debrided the wound today utilizing the 312 with scalpel to healthy, viable tissue.  Recommend antibiotic ointment which I prescribed and also doxycycline. -Surgical shoe, offloading -Current glucose control.  Elevation. -Patient encouraged to call the office with any questions, concerns, change in symptoms.   Vivi Barrack DPM

## 2019-12-29 ENCOUNTER — Ambulatory Visit: Payer: 59 | Admitting: Podiatry

## 2020-01-02 ENCOUNTER — Ambulatory Visit (INDEPENDENT_AMBULATORY_CARE_PROVIDER_SITE_OTHER): Payer: 59 | Admitting: Podiatry

## 2020-01-02 ENCOUNTER — Encounter: Payer: Self-pay | Admitting: Podiatry

## 2020-01-02 ENCOUNTER — Other Ambulatory Visit: Payer: Self-pay

## 2020-01-02 DIAGNOSIS — E08621 Diabetes mellitus due to underlying condition with foot ulcer: Secondary | ICD-10-CM

## 2020-01-02 DIAGNOSIS — L97501 Non-pressure chronic ulcer of other part of unspecified foot limited to breakdown of skin: Secondary | ICD-10-CM

## 2020-01-02 DIAGNOSIS — E1149 Type 2 diabetes mellitus with other diabetic neurological complication: Secondary | ICD-10-CM | POA: Diagnosis not present

## 2020-01-03 ENCOUNTER — Telehealth: Payer: Self-pay | Admitting: *Deleted

## 2020-01-03 NOTE — Telephone Encounter (Signed)
Kelli Hudson- can you please help provide a note for this. She is to wear the boot until I see her back next appointment. Thanks!

## 2020-01-03 NOTE — Telephone Encounter (Signed)
Luther Parody w/ [employer]. is wanting to know how long patient  Is required to wear boot during shift.at work, no end date specified in note. Please contact at [phone].

## 2020-01-05 ENCOUNTER — Encounter: Payer: Self-pay | Admitting: Podiatry

## 2020-01-10 NOTE — Progress Notes (Signed)
Subjective: 29 year old female presents the office today for follow evaluation of a wound to the left fourth toe.  She states overall the toe is doing better denies any drainage or pus and the swelling is improved.  She feels the wound has been healing.  There are any new issues today. Denies any systemic complaints such as fevers, chills, nausea, vomiting. No acute changes since last appointment, and no other complaints at this time.   Objective: AAO x3, NAD DP/PT pulses palpable bilaterally, CRT less than 3 seconds Sensation decreased to [facility] monofilament. Previous partial fifth ray amputation left side Ulceration, hyperkeratotic lesion on the dorsal, distal aspect left fourth toe and DIPJ.  Upon debridement small pinpoint wound is evident but there is no drainage or pus identified.  There is decreased swelling and there is no significant erythema or warmth.  No ascending cellulitis.  There is no fluctuation or crepitation.  There is no malodor. No pain with calf compression, swelling, warmth, erythema  Assessment: Ulceration left fourth toe, uncontrolled diabetes  Plan: -All treatment options discussed with the patient including all alternatives, risks, complications.  -Debrided the hyperkeratotic lesion on the wound today without any complications utilizing the 312 with scalpel.  The wound is almost healed at this point.  Continue daily dressing change with antibiotic ointment.  Continue offloading surgical shoe, elevation. -Glucose control. -Patient encouraged to call the office with any questions, concerns, change in symptoms.   Vivi Barrack DPM

## 2020-01-19 ENCOUNTER — Encounter: Payer: Self-pay | Admitting: Podiatry

## 2020-01-19 ENCOUNTER — Ambulatory Visit (INDEPENDENT_AMBULATORY_CARE_PROVIDER_SITE_OTHER): Payer: 59 | Admitting: Podiatry

## 2020-01-19 ENCOUNTER — Other Ambulatory Visit: Payer: Self-pay

## 2020-01-19 DIAGNOSIS — L97501 Non-pressure chronic ulcer of other part of unspecified foot limited to breakdown of skin: Secondary | ICD-10-CM | POA: Diagnosis not present

## 2020-01-19 DIAGNOSIS — E08621 Diabetes mellitus due to underlying condition with foot ulcer: Secondary | ICD-10-CM

## 2020-01-19 DIAGNOSIS — E1149 Type 2 diabetes mellitus with other diabetic neurological complication: Secondary | ICD-10-CM | POA: Diagnosis not present

## 2020-01-24 ENCOUNTER — Emergency Department (HOSPITAL_COMMUNITY): Payer: Managed Care, Other (non HMO)

## 2020-01-24 ENCOUNTER — Inpatient Hospital Stay (HOSPITAL_COMMUNITY)
Admission: EM | Admit: 2020-01-24 | Discharge: 2020-01-30 | DRG: 074 | Disposition: A | Discharge status: Home / Self Care | Payer: Managed Care, Other (non HMO) | Attending: Family Medicine | Admitting: Family Medicine

## 2020-01-24 ENCOUNTER — Encounter (HOSPITAL_COMMUNITY): Payer: Self-pay

## 2020-01-24 ENCOUNTER — Other Ambulatory Visit: Payer: Self-pay

## 2020-01-24 DIAGNOSIS — R109 Unspecified abdominal pain: Secondary | ICD-10-CM | POA: Diagnosis not present

## 2020-01-24 DIAGNOSIS — I152 Hypertension secondary to endocrine disorders: Secondary | ICD-10-CM | POA: Diagnosis present

## 2020-01-24 DIAGNOSIS — E10621 Type 1 diabetes mellitus with foot ulcer: Secondary | ICD-10-CM | POA: Diagnosis present

## 2020-01-24 DIAGNOSIS — R112 Nausea with vomiting, unspecified: Secondary | ICD-10-CM

## 2020-01-24 DIAGNOSIS — K2100 Gastro-esophageal reflux disease with esophagitis, without bleeding: Secondary | ICD-10-CM | POA: Diagnosis present

## 2020-01-24 DIAGNOSIS — Z20822 Contact with and (suspected) exposure to covid-19: Secondary | ICD-10-CM | POA: Diagnosis present

## 2020-01-24 DIAGNOSIS — I10 Essential (primary) hypertension: Secondary | ICD-10-CM | POA: Diagnosis present

## 2020-01-24 DIAGNOSIS — E119 Type 2 diabetes mellitus without complications: Secondary | ICD-10-CM | POA: Diagnosis present

## 2020-01-24 DIAGNOSIS — E1043 Type 1 diabetes mellitus with diabetic autonomic (poly)neuropathy: Secondary | ICD-10-CM | POA: Diagnosis not present

## 2020-01-24 DIAGNOSIS — E1065 Type 1 diabetes mellitus with hyperglycemia: Secondary | ICD-10-CM | POA: Diagnosis present

## 2020-01-24 DIAGNOSIS — Z801 Family history of malignant neoplasm of trachea, bronchus and lung: Secondary | ICD-10-CM

## 2020-01-24 DIAGNOSIS — L97509 Non-pressure chronic ulcer of other part of unspecified foot with unspecified severity: Secondary | ICD-10-CM | POA: Diagnosis present

## 2020-01-24 DIAGNOSIS — I959 Hypotension, unspecified: Secondary | ICD-10-CM | POA: Diagnosis not present

## 2020-01-24 DIAGNOSIS — K3184 Gastroparesis: Secondary | ICD-10-CM | POA: Diagnosis present

## 2020-01-24 DIAGNOSIS — IMO0002 Reserved for concepts with insufficient information to code with codable children: Secondary | ICD-10-CM | POA: Diagnosis present

## 2020-01-24 DIAGNOSIS — E1165 Type 2 diabetes mellitus with hyperglycemia: Secondary | ICD-10-CM | POA: Diagnosis present

## 2020-01-24 DIAGNOSIS — K2970 Gastritis, unspecified, without bleeding: Secondary | ICD-10-CM | POA: Diagnosis present

## 2020-01-24 DIAGNOSIS — Z89422 Acquired absence of other left toe(s): Secondary | ICD-10-CM

## 2020-01-24 DIAGNOSIS — Z79899 Other long term (current) drug therapy: Secondary | ICD-10-CM

## 2020-01-24 DIAGNOSIS — Z793 Long term (current) use of hormonal contraceptives: Secondary | ICD-10-CM

## 2020-01-24 DIAGNOSIS — E1159 Type 2 diabetes mellitus with other circulatory complications: Secondary | ICD-10-CM | POA: Diagnosis present

## 2020-01-24 DIAGNOSIS — Z87891 Personal history of nicotine dependence: Secondary | ICD-10-CM

## 2020-01-24 DIAGNOSIS — Z8711 Personal history of peptic ulcer disease: Secondary | ICD-10-CM

## 2020-01-24 DIAGNOSIS — D75839 Thrombocytosis, unspecified: Secondary | ICD-10-CM | POA: Diagnosis present

## 2020-01-24 DIAGNOSIS — Z794 Long term (current) use of insulin: Secondary | ICD-10-CM

## 2020-01-24 DIAGNOSIS — K920 Hematemesis: Secondary | ICD-10-CM | POA: Diagnosis not present

## 2020-01-24 DIAGNOSIS — R000 Tachycardia, unspecified: Secondary | ICD-10-CM | POA: Diagnosis not present

## 2020-01-24 LAB — CBC
HCT: 39.8 % (ref 36.0–46.0)
Hemoglobin: 13.4 g/dL (ref 12.0–15.0)
MCH: 29.6 pg (ref 26.0–34.0)
MCHC: 33.7 g/dL (ref 30.0–36.0)
MCV: 88.1 fL (ref 80.0–100.0)
Platelets: 426 10*3/uL — ABNORMAL HIGH (ref 150–400)
RBC: 4.52 MIL/uL (ref 3.87–5.11)
RDW: 12 % (ref 11.5–15.5)
WBC: 6.5 10*3/uL (ref 4.0–10.5)
nRBC: 0 % (ref 0.0–0.2)

## 2020-01-24 LAB — COMPREHENSIVE METABOLIC PANEL
ALT: 18 U/L (ref 0–44)
AST: 18 U/L (ref 15–41)
Albumin: 3.6 g/dL (ref 3.5–5.0)
Alkaline Phosphatase: 65 U/L (ref 38–126)
Anion gap: 14 (ref 5–15)
BUN: 19 mg/dL (ref 6–20)
CO2: 24 mmol/L (ref 22–32)
Calcium: 9.7 mg/dL (ref 8.9–10.3)
Chloride: 96 mmol/L — ABNORMAL LOW (ref 98–111)
Creatinine, Ser: 0.89 mg/dL (ref 0.44–1.00)
GFR, Estimated: >60 mL/min (ref >60)
Glucose, Bld: 387 mg/dL — ABNORMAL HIGH (ref 70–99)
Potassium: 4 mmol/L (ref 3.5–5.1)
Sodium: 134 mmol/L — ABNORMAL LOW (ref 135–145)
Total Bilirubin: 1 mg/dL (ref 0.3–1.2)
Total Protein: 7.9 g/dL (ref 6.5–8.1)

## 2020-01-24 LAB — CBG MONITORING, ED
Glucose-Capillary: 395 mg/dL — ABNORMAL HIGH (ref 70–99)
Glucose-Capillary: 332 mg/dL — ABNORMAL HIGH (ref 70–99)
Glucose-Capillary: 309 mg/dL — ABNORMAL HIGH (ref 70–99)

## 2020-01-24 LAB — I-STAT BETA HCG BLOOD, ED (MC, WL, AP ONLY): I-stat hCG, quantitative: <5 m[IU]/mL (ref <5)

## 2020-01-24 LAB — RESP PANEL BY RT-PCR (FLU A&B, COVID) ARPGX2
Influenza A by PCR: NEGATIVE
Influenza B by PCR: NEGATIVE
SARS Coronavirus 2 by RT PCR: NEGATIVE

## 2020-01-24 LAB — TROPONIN I (HIGH SENSITIVITY): Troponin I (High Sensitivity): 4 ng/L (ref <18)

## 2020-01-24 LAB — LIPASE, BLOOD: Lipase: 21 U/L (ref 11–51)

## 2020-01-24 MED ORDER — FENTANYL CITRATE (PF) 100 MCG/2ML IJ SOLN
50.0000 ug | Freq: Once | INTRAMUSCULAR | Status: AC
  Administered 2020-01-24: 50 ug via INTRAVENOUS
  Filled 2020-01-24: qty 2

## 2020-01-24 MED ORDER — LACTATED RINGERS IV BOLUS
1000.0000 mL | Freq: Once | INTRAVENOUS | Status: AC
  Administered 2020-01-24: 1000 mL via INTRAVENOUS

## 2020-01-24 MED ORDER — PANTOPRAZOLE SODIUM 40 MG IV SOLR
40.0000 mg | Freq: Once | INTRAVENOUS | Status: AC
  Administered 2020-01-24: 40 mg via INTRAVENOUS
  Filled 2020-01-24: qty 40

## 2020-01-24 MED ORDER — IOHEXOL 300 MG/ML  SOLN
100.0000 mL | Freq: Once | INTRAMUSCULAR | Status: AC | PRN
  Administered 2020-01-24: 100 mL via INTRAVENOUS

## 2020-01-24 MED ORDER — METOCLOPRAMIDE HCL 5 MG/ML IJ SOLN
5.0000 mg | Freq: Once | INTRAMUSCULAR | Status: AC
  Administered 2020-01-24: 5 mg via INTRAVENOUS
  Filled 2020-01-24: qty 2

## 2020-01-24 MED ORDER — HYDROMORPHONE HCL 1 MG/ML IJ SOLN
1.0000 mg | Freq: Once | INTRAMUSCULAR | Status: AC
  Administered 2020-01-24: 1 mg via INTRAVENOUS
  Filled 2020-01-24: qty 1

## 2020-01-24 MED ORDER — ONDANSETRON 4 MG PO TBDP
4.0000 mg | ORAL_TABLET | Freq: Once | ORAL | Status: AC | PRN
  Administered 2020-01-24: 4 mg via ORAL
  Filled 2020-01-24: qty 1

## 2020-01-24 MED ORDER — INSULIN ASPART 100 UNIT/ML ~~LOC~~ SOLN
10.0000 [IU] | Freq: Once | SUBCUTANEOUS | Status: AC
  Administered 2020-01-24: 10 [IU] via SUBCUTANEOUS
  Filled 2020-01-24: qty 0.1

## 2020-01-24 MED ORDER — DROPERIDOL 2.5 MG/ML IJ SOLN
1.2500 mg | Freq: Once | INTRAMUSCULAR | Status: AC
  Administered 2020-01-24: 1.25 mg via INTRAVENOUS
  Filled 2020-01-24: qty 2

## 2020-01-24 NOTE — ED Provider Notes (Signed)
[facility] DEPT Provider Note   CSN: [id number] Arrival date & time: 01/24/20  1259     History Chief Complaint  Patient presents with  . Abdominal Pain    Kelli Hudson is a 29 y.o. female. Presents here with concern for abdominal pain, nausea vomiting. Patient reports symptoms started this morning, similar to prior episodes. Pain is severe, upper abdomen, radiates up into her chest at times. Vomit noted small streaks of blood, no large chunks or coffee-ground appearance. Pain is sharp, stabbing. No alleviating factors. Currently not anything coming up when she vomits.  Type 1 diabetes, history of gastroparesis, admission for similar symptoms in August.   Additional history from chart review, care everywhere - EGD 8/19 showed clean-based linear ulcer present at the GE junction, otherwise nml esophagus. Coffee-ground material and dark clear fluid noted to be present in the stomach. Scattered erosions and erythema present throughout the stomach. Pylorus patent. Duodenum grossly normal. Gastric biopsy returned positive for H. pylori. Patient was initiated on quadruple therapy    HPI     Past Medical History:  Diagnosis Date  . Diabetes mellitus without complication ([place])   . Hypertension     Patient Active Problem List   Diagnosis Date Noted  . Depression with anxiety 07/05/2019  . Need for Tdap vaccination 07/05/2019  . Diabetic foot ulcer ([place]) 02/21/2018  . Skin ulcer of left foot with fat layer exposed ([place]) 09/24/2017  . Diabetic polyneuropathy associated with type 2 diabetes mellitus ([place]) 09/24/2017  . Pain in right foot 09/24/2017  . Diabetic ketoacidosis without coma associated with type 2 diabetes mellitus ([place]) 01/02/2016  . Gastroparesis   . Leukocytosis   . Hyperglycemia   . Abdominal pain   . Diabetes mellitus due to underlying condition, uncontrolled, with hyperosmolarity without coma, without long-term current use of  insulin ([place])   . Increased frequency of urination 06/14/2011  . UTI (urinary tract infection) 06/14/2011  . THYROMEGALY 10/05/2009  . Uncontrolled diabetes mellitus ([place]) 10/05/2009  . Essential hypertension 10/05/2009    Past Surgical History:  Procedure Laterality Date  . AMPUTATION TOE Left 03/10/2018   Procedure: AMPUTATION FIFTH TOE;  Surgeon: Trula Slade, DPM;  Location: [facility];  Service: Podiatry;  Laterality: Left;  . ESOPHAGOGASTRODUODENOSCOPY (EGD) WITH PROPOFOL Left 09/08/2015   Procedure: ESOPHAGOGASTRODUODENOSCOPY (EGD) WITH PROPOFOL;  Surgeon: Arta Silence, MD;  Location: [facility] ENDOSCOPY;  Service: Endoscopy;  Laterality: Left;  . WISDOM TOOTH EXTRACTION       OB History   No obstetric history on file.     Family History  Problem Relation Age of Onset  . Lung cancer Mother   . Bipolar disorder Father     Social History   Tobacco Use  . Smoking status: Former Research scientist (life sciences)  . Smokeless tobacco: Never Used  Vaping Use  . Vaping Use: Never used  Substance Use Topics  . Alcohol use: Yes    Alcohol/week: 1.0 standard drink    Types: 1 Shots of liquor per week    Comment: occasional   . Drug use: No    Home Medications Prior to Admission medications   Medication Sig Start Date End Date Taking? Authorizing Provider  Blood Glucose Monitoring Suppl (TRUE METRIX METER) w/Device KIT 1 Device by Does not apply route 4 (four) times daily. 07/05/19   Libby Maw, MD  doxycycline (VIBRA-TABS) 100 MG tablet Take 1 tablet (100 mg total) by mouth 2 (two) times daily. 12/23/19  Trula Slade, DPM  glucose blood (TRUE METRIX BLOOD GLUCOSE TEST) test strip Use as instructed 07/05/19   Libby Maw, MD  ibuprofen (ADVIL,MOTRIN) 200 MG tablet Take 400-600 mg by mouth every 6 (six) hours as needed for headache or moderate pain.     [provider]  insulin detemir (LEVEMIR) 100 UNIT/ML FlexPen Inject 18 Units into the skin  daily. 07/05/19   Libby Maw, MD  insulin lispro (HUMALOG) 100 UNIT/ML injection Inject 0.05 mLs (5 Units total) into the skin 3 (three) times daily with meals. 07/05/19   Libby Maw, MD  Insulin Pen Needle 31G X 5 MM MISC 1 Device by Does not apply route QID. For use with insulin pens 07/05/19   Libby Maw, MD  mupirocin ointment (BACTROBAN) 2 % Apply 1 application topically 2 (two) times daily. 12/23/19   Trula Slade, DPM  norethindrone-ethinyl estradiol (LOESTRIN) 1-20 MG-MCG tablet Take 1 tablet by mouth daily.    [provider]  ondansetron (ZOFRAN ODT) 4 MG disintegrating tablet Take 1 tablet (4 mg total) by mouth every 8 (eight) hours as needed for up to 15 doses for nausea or vomiting. 10/03/19   Wyvonnia Dusky, MD  ondansetron (ZOFRAN) 4 MG tablet Take 1 tablet (4 mg total) by mouth every 8 (eight) hours as needed for nausea or vomiting. 10/04/19   Libby Maw, MD  TRUEPLUS LANCETS 28G MISC assist with checking blood sugar TID and qhs Patient not taking: Reported on 07/05/2019 09/10/15   Brayton Caves, PA-C  insulin aspart (NOVOLOG) 100 UNIT/ML FlexPen Inject 5 Units into the skin 3 (three) times daily with meals. 02/23/18 07/05/19  Donne Hazel, MD  promethazine (PHENERGAN) 25 MG tablet Take 1 tablet (25 mg total) by mouth every 8 (eight) hours as needed for nausea or vomiting. Patient not taking: Reported on 07/05/2019 03/10/18 10/04/19  Trula Slade, DPM    Allergies    Patient has no known allergies.  Review of Systems   Review of Systems  Constitutional: Negative for chills and fever.  HENT: Negative for ear pain and sore throat.   Eyes: Negative for pain and visual disturbance.  Respiratory: Negative for cough and shortness of breath.   Cardiovascular: Negative for chest pain and palpitations.  Gastrointestinal: Positive for abdominal pain, nausea and vomiting.  Genitourinary: Negative for dysuria and hematuria.   Musculoskeletal: Negative for arthralgias and back pain.  Skin: Negative for color change and rash.  Neurological: Negative for seizures and syncope.  All other systems reviewed and are negative.   Physical Exam Updated Vital Signs BP (!) 92/51   Pulse (!) 113   Temp 98.7 F (37.1 C) (Oral)   Resp 12   LMP 01/24/2020   SpO2 98%   Physical Exam Vitals and nursing note reviewed.  Constitutional:      General: She is not in acute distress.    Appearance: She is well-developed.  HENT:     Head: Normocephalic and atraumatic.  Eyes:     Conjunctiva/sclera: Conjunctivae normal.  Cardiovascular:     Rate and Rhythm: Normal rate and regular rhythm.     Heart sounds: No murmur heard.   Pulmonary:     Effort: Pulmonary effort is normal. No respiratory distress.     Breath sounds: Normal breath sounds.  Abdominal:     Palpations: Abdomen is soft.     Tenderness: There is generalized abdominal tenderness and tenderness in the epigastric area.  There is no guarding or rebound.  Musculoskeletal:     Cervical back: Neck supple.  Skin:    General: Skin is warm and dry.  Neurological:     General: No focal deficit present.     Mental Status: She is alert.     ED Results / Procedures / Treatments   Labs (all labs ordered are listed, but only abnormal results are displayed) Labs Reviewed  COMPREHENSIVE METABOLIC PANEL - Abnormal; Notable for the following components:      Result Value   Sodium 134 (*)    Chloride 96 (*)    Glucose, Bld 387 (*)    All other components within normal limits  CBC - Abnormal; Notable for the following components:   Platelets 426 (*)    All other components within normal limits  CBG MONITORING, ED - Abnormal; Notable for the following components:   Glucose-Capillary 395 (*)    All other components within normal limits  CBG MONITORING, ED - Abnormal; Notable for the following components:   Glucose-Capillary 332 (*)    All other components within  normal limits  RESP PANEL BY RT-PCR (FLU A&B, COVID) ARPGX2  LIPASE, BLOOD  URINALYSIS, ROUTINE W REFLEX MICROSCOPIC  I-STAT BETA HCG BLOOD, ED ([facility], [facility], [facility] ONLY)  TROPONIN I (HIGH SENSITIVITY)    EKG None  Radiology DG Chest 2 View  Result Date: 01/24/2020 CLINICAL DATA:  Chest pain and vomiting. EXAM: CHEST - 2 VIEW COMPARISON:  February 21, 2018 FINDINGS: The heart size and mediastinal contours are within normal limits. Both lungs are clear. The visualized skeletal structures are unremarkable. IMPRESSION: No active cardiopulmonary disease. Electronically Signed   By: Virgina Norfolk M.D.   On: 01/24/2020 21:50   CT ABDOMEN PELVIS W CONTRAST  Result Date: 01/24/2020 CLINICAL DATA:  History of gastric ulcer with a severe abdominal pain. EXAM: CT ABDOMEN AND PELVIS WITH CONTRAST TECHNIQUE: Multidetector CT imaging of the abdomen and pelvis was performed using the standard protocol following bolus administration of intravenous contrast. CONTRAST:  167m OMNIPAQUE IOHEXOL 300 MG/ML  SOLN COMPARISON:  CT dated 10/03/2019 FINDINGS: Lower chest: The lung bases are clear. The heart size is normal. Hepatobiliary: The liver is normal. Normal gallbladder.There is no biliary ductal dilation. Pancreas: Normal contours without ductal dilatation. No peripancreatic fluid collection. Spleen: Unremarkable. Adrenals/Urinary Tract: --Adrenal glands: Unremarkable. --Right kidney/ureter: No hydronephrosis or radiopaque kidney stones. --Left kidney/ureter: No hydronephrosis or radiopaque kidney stones. --Urinary bladder: Unremarkable. Stomach/Bowel: --Stomach/Duodenum: No hiatal hernia or other gastric abnormality. Normal duodenal course and caliber. --Small bowel: Unremarkable. --Colon: Majority of the colon is underdistended which limits evaluation. It is difficult to exclude underlying infectious or inflammatory colitis especially at the level of the ascending colon and hepatic flexure. --Appendix: Normal.  Vascular/Lymphatic: Normal course and caliber of the major abdominal vessels. --No retroperitoneal lymphadenopathy. --No mesenteric lymphadenopathy. --No pelvic or inguinal lymphadenopathy. Reproductive: There is a probable small fibroid in the anterior uterine fundus. Other: No ascites or free air. The abdominal wall is normal. Musculoskeletal. No acute displaced fractures. IMPRESSION: 1. Majority of the colon is underdistended which limits evaluation. It is difficult to exclude underlying infectious or inflammatory colitis especially at the level of the ascending colon and hepatic flexure. 2. Normal appendix. 3. Fibroid uterus. Electronically Signed   By: CConstance HolsterM.D.   On: 01/24/2020 21:52    Procedures Procedures (including critical care time)  Medications Ordered in ED Medications  ondansetron (ZOFRAN-ODT) disintegrating tablet 4 mg (4 mg Oral  Given 01/24/20 1353)  pantoprazole (PROTONIX) injection 40 mg (40 mg Intravenous Given 01/24/20 1919)  lactated ringers bolus 1,000 mL (0 mLs Intravenous Stopped 01/24/20 2004)  droperidol (INAPSINE) 2.5 MG/ML injection 1.25 mg (1.25 mg Intravenous Given 01/24/20 1919)  fentaNYL (SUBLIMAZE) injection 50 mcg (50 mcg Intravenous Given 01/24/20 1919)  insulin aspart (novoLOG) injection 10 Units (10 Units Subcutaneous Given 01/24/20 1929)  HYDROmorphone (DILAUDID) injection 1 mg (1 mg Intravenous Given 01/24/20 2122)  metoCLOPramide (REGLAN) injection 5 mg (5 mg Intravenous Given 01/24/20 2122)  lactated ringers bolus 1,000 mL (1,000 mLs Intravenous New Bag/Given 01/24/20 2122)  iohexol (OMNIPAQUE) 300 MG/ML solution 100 mL (100 mLs Intravenous Contrast Given 01/24/20 2126)    ED Course  I have reviewed the triage vital signs and the nursing notes.  Pertinent labs & imaging results that were available during my care of the patient were reviewed by me and considered in my medical decision making (see chart for details).    MDM Rules/Calculators/A&P                          29 year old lady presenting to ER with concern for epigastric pain nausea vomiting. On exam patient noted to be uncomfortable. Abdomen soft but did have significant tenderness in epigastrium. Reported streaks of blood in vomit but no further episodes in ER. CT scan negative for acute abdominal pathology. On lab work noted hyperglycemia, no acidosis to suggest DKA. Suspect symptoms related to gastric ulcer disease versus gastroparesis. Provided aggressive symptomatic control, Protonix, patient still having significant nausea and pain. Will admit patient for symptomatic management and further observation.  Final Clinical Impression(s) / ED Diagnoses Final diagnoses:  Intractable nausea and vomiting  Gastroparesis    Rx / DC Orders ED Discharge Orders    None       Lucrezia Starch, MD 01/24/20 2220

## 2020-01-24 NOTE — ED Triage Notes (Signed)
Pt presents with c/o abdominal pain and vomiting that started this morning. Pt reports a hx of a stomach ulcer but reports that she did not eat anything in particular to flare up her stomach ulcer.

## 2020-01-24 NOTE — Progress Notes (Signed)
Subjective: 29 year old female presents the office today for follow evaluation of a wound to the left fourth toe.  She states that overall she is doing much better.  The swelling has improved and the wound appears to be almost healed.  Denies any drainage or pus or any redness or red streaks.  Denies any fevers, chills, nausea, vomiting.  No calf pain, chest pain, shortness of breath.    Objective: AAO x3, NAD DP/PT pulses palpable bilaterally, CRT less than 3 seconds Sensation decreased to [facility] monofilament. Previous partial fifth ray amputation left side On the dorsal, distal aspect of the left fourth toe on the DIPJ is a hyperkeratotic lesion.  Upon debridement the wound is almost completely healed and only a superficial wound is still evident appears normal is an abrasion type lesion.  There is no probing, undermining or tunneling.  There is no surrounding erythema, ascending cellulitis.  There is no fluctuation or crepitation.  There is no malodor.  No obvious signs of infection.  Swelling is improving. No pain with calf compression, swelling, warmth, erythema  Assessment: Ulceration left fourth toe, uncontrolled diabetes  Plan: -All treatment options discussed with the patient including all alternatives, risks, complications.  -Debrided the hyperkeratotic lesion on the wound today without any complications utilizing the 312 with scalpel.  Continue antibiotic ointment dressing changes daily.  Continue offloading.   -Glucose control. -Patient encouraged to call the office with any questions, concerns, change in symptoms.   Vivi Barrack DPM

## 2020-01-25 ENCOUNTER — Other Ambulatory Visit: Payer: Self-pay

## 2020-01-25 ENCOUNTER — Encounter (HOSPITAL_COMMUNITY): Payer: Self-pay | Admitting: Family Medicine

## 2020-01-25 DIAGNOSIS — D75839 Thrombocytosis, unspecified: Secondary | ICD-10-CM | POA: Diagnosis present

## 2020-01-25 DIAGNOSIS — R000 Tachycardia, unspecified: Secondary | ICD-10-CM | POA: Diagnosis not present

## 2020-01-25 DIAGNOSIS — Z8711 Personal history of peptic ulcer disease: Secondary | ICD-10-CM | POA: Diagnosis not present

## 2020-01-25 DIAGNOSIS — E1065 Type 1 diabetes mellitus with hyperglycemia: Secondary | ICD-10-CM | POA: Diagnosis present

## 2020-01-25 DIAGNOSIS — E10621 Type 1 diabetes mellitus with foot ulcer: Secondary | ICD-10-CM | POA: Diagnosis present

## 2020-01-25 DIAGNOSIS — Z801 Family history of malignant neoplasm of trachea, bronchus and lung: Secondary | ICD-10-CM | POA: Diagnosis not present

## 2020-01-25 DIAGNOSIS — K3184 Gastroparesis: Secondary | ICD-10-CM | POA: Diagnosis present

## 2020-01-25 DIAGNOSIS — Z79899 Other long term (current) drug therapy: Secondary | ICD-10-CM | POA: Diagnosis not present

## 2020-01-25 DIAGNOSIS — I10 Essential (primary) hypertension: Secondary | ICD-10-CM | POA: Diagnosis present

## 2020-01-25 DIAGNOSIS — R112 Nausea with vomiting, unspecified: Secondary | ICD-10-CM | POA: Insufficient documentation

## 2020-01-25 DIAGNOSIS — R109 Unspecified abdominal pain: Secondary | ICD-10-CM

## 2020-01-25 DIAGNOSIS — K920 Hematemesis: Secondary | ICD-10-CM | POA: Diagnosis not present

## 2020-01-25 DIAGNOSIS — I959 Hypotension, unspecified: Secondary | ICD-10-CM | POA: Diagnosis not present

## 2020-01-25 DIAGNOSIS — Z20822 Contact with and (suspected) exposure to covid-19: Secondary | ICD-10-CM | POA: Diagnosis present

## 2020-01-25 DIAGNOSIS — K2970 Gastritis, unspecified, without bleeding: Secondary | ICD-10-CM | POA: Diagnosis present

## 2020-01-25 DIAGNOSIS — Z793 Long term (current) use of hormonal contraceptives: Secondary | ICD-10-CM | POA: Diagnosis not present

## 2020-01-25 DIAGNOSIS — Z89422 Acquired absence of other left toe(s): Secondary | ICD-10-CM | POA: Diagnosis not present

## 2020-01-25 DIAGNOSIS — K2100 Gastro-esophageal reflux disease with esophagitis, without bleeding: Secondary | ICD-10-CM | POA: Diagnosis present

## 2020-01-25 DIAGNOSIS — Z794 Long term (current) use of insulin: Secondary | ICD-10-CM | POA: Diagnosis not present

## 2020-01-25 DIAGNOSIS — L97509 Non-pressure chronic ulcer of other part of unspecified foot with unspecified severity: Secondary | ICD-10-CM | POA: Diagnosis present

## 2020-01-25 DIAGNOSIS — E1043 Type 1 diabetes mellitus with diabetic autonomic (poly)neuropathy: Secondary | ICD-10-CM | POA: Diagnosis present

## 2020-01-25 DIAGNOSIS — Z87891 Personal history of nicotine dependence: Secondary | ICD-10-CM | POA: Diagnosis not present

## 2020-01-25 LAB — GLUCOSE, CAPILLARY
Glucose-Capillary: 135 mg/dL — ABNORMAL HIGH (ref 70–99)
Glucose-Capillary: 149 mg/dL — ABNORMAL HIGH (ref 70–99)
Glucose-Capillary: 170 mg/dL — ABNORMAL HIGH (ref 70–99)
Glucose-Capillary: 175 mg/dL — ABNORMAL HIGH (ref 70–99)
Glucose-Capillary: 199 mg/dL — ABNORMAL HIGH (ref 70–99)
Glucose-Capillary: 282 mg/dL — ABNORMAL HIGH (ref 70–99)

## 2020-01-25 LAB — BASIC METABOLIC PANEL
Anion gap: 12 (ref 5–15)
BUN: 24 mg/dL — ABNORMAL HIGH (ref 6–20)
CO2: 25 mmol/L (ref 22–32)
Calcium: 9.2 mg/dL (ref 8.9–10.3)
Chloride: 97 mmol/L — ABNORMAL LOW (ref 98–111)
Creatinine, Ser: 0.91 mg/dL (ref 0.44–1.00)
GFR, Estimated: >60 mL/min (ref >60)
Glucose, Bld: 200 mg/dL — ABNORMAL HIGH (ref 70–99)
Potassium: 3.7 mmol/L (ref 3.5–5.1)
Sodium: 134 mmol/L — ABNORMAL LOW (ref 135–145)

## 2020-01-25 LAB — URINALYSIS, ROUTINE W REFLEX MICROSCOPIC
Bacteria, UA: NONE SEEN
Bilirubin Urine: NEGATIVE
Glucose, UA: >=500 mg/dL — AB
Ketones, ur: 20 mg/dL — AB
Leukocytes,Ua: NEGATIVE
Nitrite: NEGATIVE
Protein, ur: 100 mg/dL — AB
Specific Gravity, Urine: 1.015 (ref 1.005–1.030)
pH: 5 (ref 5.0–8.0)

## 2020-01-25 LAB — HEMATOCRIT
HCT: 36.6 % (ref 36.0–46.0)
HCT: 37.2 % (ref 36.0–46.0)

## 2020-01-25 LAB — CBG MONITORING, ED: Glucose-Capillary: 312 mg/dL — ABNORMAL HIGH (ref 70–99)

## 2020-01-25 LAB — HIV ANTIBODY (ROUTINE TESTING W REFLEX): HIV Screen 4th Generation wRfx: NONREACTIVE

## 2020-01-25 LAB — HEMOGLOBIN: Hemoglobin: 12.5 g/dL (ref 12.0–15.0)

## 2020-01-25 MED ORDER — METOCLOPRAMIDE HCL 5 MG/ML IJ SOLN: 5.0000 mg | Freq: Three times a day (TID) | INTRAMUSCULAR | Status: DC

## 2020-01-25 MED ORDER — SODIUM CHLORIDE 0.9 % IV SOLN: INTRAVENOUS | Status: DC

## 2020-01-25 MED ORDER — HYDROMORPHONE HCL 1 MG/ML IJ SOLN
0.5000 mg | INTRAMUSCULAR | Status: DC | PRN
  Administered 2020-01-26 – 2020-01-28 (×14): 1 mg via INTRAVENOUS
  Filled 2020-01-25 (×14): qty 1

## 2020-01-25 MED ORDER — ACETAMINOPHEN 650 MG RE SUPP: 650.0000 mg | Freq: Four times a day (QID) | RECTAL | Status: DC | PRN

## 2020-01-25 MED ORDER — SODIUM CHLORIDE 0.9 % IV SOLN: INTRAVENOUS | Status: AC

## 2020-01-25 MED ORDER — ONDANSETRON HCL 4 MG PO TABS
4.0000 mg | ORAL_TABLET | Freq: Four times a day (QID) | ORAL | Status: DC | PRN
  Administered 2020-01-27: 4 mg via ORAL

## 2020-01-25 MED ORDER — INSULIN ASPART 100 UNIT/ML ~~LOC~~ SOLN
0.0000 [IU] | SUBCUTANEOUS | Status: DC
  Administered 2020-01-25 (×2): 2 [IU] via SUBCUTANEOUS
  Administered 2020-01-25: 5 [IU] via SUBCUTANEOUS
  Administered 2020-01-25: 2 [IU] via SUBCUTANEOUS
  Administered 2020-01-25: 1 [IU] via SUBCUTANEOUS
  Administered 2020-01-26: 3 [IU] via SUBCUTANEOUS
  Administered 2020-01-26: 2 [IU] via SUBCUTANEOUS
  Administered 2020-01-26 (×2): 1 [IU] via SUBCUTANEOUS
  Administered 2020-01-26: 2 [IU] via SUBCUTANEOUS
  Administered 2020-01-27: 1 [IU] via SUBCUTANEOUS
  Administered 2020-01-27: 0 [IU] via SUBCUTANEOUS
  Administered 2020-01-27: 1 [IU] via SUBCUTANEOUS
  Administered 2020-01-28: 05:00:00 2 [IU] via SUBCUTANEOUS
  Administered 2020-01-28: 22:00:00 1 [IU] via SUBCUTANEOUS
  Administered 2020-01-29: 01:00:00 2 [IU] via SUBCUTANEOUS
  Filled 2020-01-25: qty 0.09

## 2020-01-25 MED ORDER — ACETAMINOPHEN 325 MG PO TABS: 650.0000 mg | ORAL_TABLET | Freq: Four times a day (QID) | ORAL | Status: DC | PRN

## 2020-01-25 MED ORDER — SODIUM CHLORIDE 0.9% FLUSH
3.0000 mL | Freq: Two times a day (BID) | INTRAVENOUS | Status: DC
  Administered 2020-01-25 – 2020-01-29 (×9): 3 mL via INTRAVENOUS

## 2020-01-25 MED ORDER — INSULIN GLARGINE 100 UNIT/ML ~~LOC~~ SOLN
12.0000 [IU] | Freq: Every day | SUBCUTANEOUS | Status: DC
  Administered 2020-01-25 – 2020-01-29 (×4): 12 [IU] via SUBCUTANEOUS
  Filled 2020-01-25 (×5): qty 0.12

## 2020-01-25 MED ORDER — ENOXAPARIN SODIUM 40 MG/0.4ML ~~LOC~~ SOLN
40.0000 mg | SUBCUTANEOUS | Status: DC
  Administered 2020-01-26 – 2020-01-29 (×4): 40 mg via SUBCUTANEOUS
  Filled 2020-01-25 (×4): qty 0.4

## 2020-01-25 MED ORDER — KETOROLAC TROMETHAMINE 30 MG/ML IJ SOLN
30.0000 mg | Freq: Four times a day (QID) | INTRAMUSCULAR | Status: DC | PRN
  Administered 2020-01-26 – 2020-01-27 (×2): 30 mg via INTRAVENOUS
  Filled 2020-01-25 (×4): qty 1

## 2020-01-25 MED ORDER — HYDROMORPHONE HCL 1 MG/ML IJ SOLN
0.5000 mg | INTRAMUSCULAR | Status: DC | PRN
Start: 2020-01-25 — End: 2020-01-25

## 2020-01-25 MED ORDER — INSULIN DETEMIR 100 UNIT/ML ~~LOC~~ SOLN
12.0000 [IU] | Freq: Every day | SUBCUTANEOUS | Status: DC
  Administered 2020-01-25: 12 [IU] via SUBCUTANEOUS
  Filled 2020-01-25: qty 0.12

## 2020-01-25 MED ORDER — METOCLOPRAMIDE HCL 5 MG/ML IJ SOLN
5.0000 mg | Freq: Three times a day (TID) | INTRAMUSCULAR | Status: DC
  Administered 2020-01-25 – 2020-01-27 (×6): 5 mg via INTRAVENOUS
  Filled 2020-01-25 (×6): qty 2

## 2020-01-25 MED ORDER — INFLUENZA VAC SPLIT QUAD 0.5 ML IM SUSY: 0.5000 mL | PREFILLED_SYRINGE | INTRAMUSCULAR | Status: DC

## 2020-01-25 MED ORDER — HYDROMORPHONE HCL 1 MG/ML IJ SOLN
0.5000 mg | INTRAMUSCULAR | Status: AC | PRN
  Administered 2020-01-25 (×3): 1 mg via INTRAVENOUS
  Filled 2020-01-25 (×3): qty 1

## 2020-01-25 MED ORDER — PANTOPRAZOLE SODIUM 40 MG IV SOLR
40.0000 mg | Freq: Two times a day (BID) | INTRAVENOUS | Status: DC
  Administered 2020-01-25 – 2020-01-29 (×9): 40 mg via INTRAVENOUS
  Filled 2020-01-25 (×9): qty 40

## 2020-01-25 MED ORDER — HYDRALAZINE HCL 20 MG/ML IJ SOLN
10.0000 mg | Freq: Four times a day (QID) | INTRAMUSCULAR | Status: DC | PRN
  Administered 2020-01-26: 10 mg via INTRAVENOUS
  Filled 2020-01-25: qty 1

## 2020-01-25 MED ORDER — ONDANSETRON HCL 4 MG/2ML IJ SOLN
4.0000 mg | Freq: Four times a day (QID) | INTRAMUSCULAR | Status: DC | PRN
  Administered 2020-01-25 – 2020-01-28 (×4): 4 mg via INTRAVENOUS
  Filled 2020-01-25 (×6): qty 2

## 2020-01-25 NOTE — Plan of Care (Signed)
  Problem: Education: Goal: Knowledge of General Education information will improve Description: Including pain rating scale, medication(s)/side effects and non-pharmacologic comfort measures Outcome: Progressing   Problem: Clinical Measurements: Goal: Diagnostic test results will improve Outcome: Progressing   Problem: Activity: Goal: Risk for activity intolerance will decrease Outcome: Progressing   Problem: Nutrition: Goal: Adequate nutrition will be maintained Outcome: Progressing   Problem: Coping: Goal: Level of anxiety will decrease Outcome: Progressing   Problem: Pain Managment: Goal: General experience of comfort will improve Outcome: Progressing   Problem: Education: Goal: Knowledge of General Education information will improve Description: Including pain rating scale, medication(s)/side effects and non-pharmacologic comfort measures Outcome: Progressing   Problem: Clinical Measurements: Goal: Diagnostic test results will improve Outcome: Progressing   Problem: Activity: Goal: Risk for activity intolerance will decrease Outcome: Progressing   Problem: Nutrition: Goal: Adequate nutrition will be maintained Outcome: Progressing   Problem: Coping: Goal: Level of anxiety will decrease Outcome: Progressing   Problem: Pain Managment: Goal: General experience of comfort will improve Outcome: Progressing

## 2020-01-25 NOTE — Progress Notes (Signed)
   01/25/20 0216  Assess: MEWS Score  Temp 97.9 F (36.6 C)  BP 138/82  Pulse Rate (!) 129  Resp 20  SpO2 100 %  Assess: MEWS Score  MEWS Temp 0  MEWS Systolic 0  MEWS Pulse 2  MEWS RR 0  MEWS LOC 0  MEWS Score 2  MEWS Score Color Yellow  Assess: if the MEWS score is Yellow or Red  Were vital signs taken at a resting state? Yes  Focused Assessment No change from prior assessment  Early Detection of Sepsis Score *See Row Information* Low  MEWS guidelines implemented *See Row Information* Yes  Treat  MEWS Interventions Escalated (See documentation below)  Take Vital Signs  Increase Vital Sign Frequency  Yellow: Q 2hr X 2 then Q 4hr X 2, if remains yellow, continue Q 4hrs  Escalate  MEWS: Escalate Yellow: discuss with charge nurse/RN and consider discussing with provider and RRT  Notify: Charge Nurse/RN  Name of Charge Nurse/RN Notified Michelle, RN  Date Charge Nurse/RN Notified 01/25/20  Time Charge Nurse/RN Notified 0230

## 2020-01-25 NOTE — Plan of Care (Signed)

## 2020-01-25 NOTE — ED Notes (Signed)
Report given to [facility] [place], [place].  Pt SBAR information covered at this time.  Receiving nurse has no additional questions.  NADN.

## 2020-01-25 NOTE — Progress Notes (Signed)
Inpatient Diabetes Program Recommendations  AACE/ADA: New Consensus Statement on Inpatient Glycemic Control (2015)  Target Ranges:  Prepandial:   less than 140 mg/dL      Peak postprandial:   less than 180 mg/dL (1-2 hours)      Critically ill patients:  140 - 180 mg/dL   Lab Results  Component Value Date   GLUCAP 175 (H) 01/25/2020   HGBA1C 13.9 (H) 07/05/2019    Review of Glycemic Control Results for Kelli Hudson, Kelli Hudson (MRN [id number]) as of 01/25/2020 10:09  Ref. Range 01/24/2020 20:01 01/24/2020 22:23 01/25/2020 01:21 01/25/2020 04:22 01/25/2020 07:21  Glucose-Capillary Latest Ref Range: 70 - 99 mg/dL 037 (H) 048 (H) 889 (H) 282 (H) 175 (H)   Diabetes history: DM Outpatient Diabetes medications: Basaglar 15 units q hs + Humalog 5 units tid meal coverage Current orders for Inpatient glycemic control: Levemir 12 units bid + Novolog correction scale q 4 hrs.  Inpatient Diabetes Program Recommendations:   Patient continues with nausea and spoke briefly to clarify patient is taking her meal coverage as well as basal.  Patients A1c 07/05/19 was 13.9 and 12/16/19 had decreased to 10.6 so significant improvement over the past months.  Thank you, Billy Fischer. Kasper Mudrick, RN, MSN, CDE  Diabetes Coordinator Inpatient Glycemic Control Team Team Pager [phone] (8am-5pm) 01/25/2020 10:16 AM

## 2020-01-25 NOTE — Progress Notes (Signed)
PROGRESS NOTE    Kelli Hudson  WNI:[id number] DOB: Mar 15, 1990 DOA: 01/24/2020 PCP: Mliss Sax, MD   Brief Narrative: Kelli Hudson is a 29 y.o. female with a history of diabetes mellitus and gastroparesis.  Patient presented secondary to intractable nausea vomiting with associated abdominal pain.  Patient made n.p.o. and started on symptomatic treatment with IV Zofran and IV fluids.   Assessment & Plan:   Principal Problem:   Intractable abdominal pain Active Problems:   Uncontrolled diabetes mellitus (HCC)   Intractable nausea/vomiting Abdominal pain Patient states she has a history of gastroparesis as diagnosed by gastroenterology.  She reports not having a gastric emptying study.  Last time she had symptoms similar to this presentation was in August 2021.  Currently, patient with continued nausea with vomiting and abdominal pain. -Continue Zofran as needed -Initiate Reglan 5 mg 3 times daily; risks and benefits were discussed with patient at bedside including the risk of tardive dyskinesia.  Patient voiced understanding of the risks and benefits and was willing to initiate treatment. -Continue Dilaudid IV as needed while n.p.o./vomiting  Diabetes mellitus, type II Uncontrolled with hyperglycemia.  Hemoglobin A1c of 13.9% in May 2021.  Patient states that she is more adherent with regimen since August 2021.  She is unclear of her specific regimen and has confused her insulin.  Patient has both Hospital doctor and Levemir listed as an outpatient regimen in addition to Humalog 3 times daily with meals for which she reports not taking.  Started on Levemir 12 units nightly while inpatient. -Will switch to Lantus 12 units nightly -Continue sliding scale insulin every 4 hours while n.p.o. -Repeat hemoglobin A1c pending  Toe ulcer Patient follow-up with podiatry as an outpatient.  Elevated blood pressure No prior history of hypertension. -Hydralazine IV  prn   DVT prophylaxis: Lovenox Code Status:   Code Status: Full Code Family Communication: None at bedside Disposition Plan: Discharge home likely in 2 to 5 days pending improvement of nausea vomiting with ability to take p.o.   Consultants:   None  Procedures:   None  Antimicrobials:  None   Subjective: Patient with continued nausea with continued episodes of emesis.  Objective: Vitals:   01/25/20 0423 01/25/20 0723 01/25/20 1116 01/25/20 1332  BP: (!) 146/69 (!) 153/113 (!) 182/121 (!) 170/112  Pulse: (!) 115 (!) 124 (!) 115 (!) 120  Resp: (!) 21 20 16 16   Temp: 98.1 F (36.7 C) 98.7 F (37.1 C) 97.7 F (36.5 C) 98.1 F (36.7 C)  TempSrc: Oral Oral Oral Oral  SpO2: 100% 100% 100% 97%  Weight:      Height:        Intake/Output Summary (Last 24 hours) at 01/25/2020 1443 Last data filed at 01/25/2020 1000 Gross per 24 hour  Intake 3325.39 ml  Output 400 ml  Net 2925.39 ml   Filed Weights   01/25/20 0235  Weight: 66.6 kg    Examination:  General exam: Appears calm  Respiratory system: Clear to auscultation. Respiratory effort normal. Cardiovascular system: S1 & S2 heard, RRR. No murmurs, rubs, gallops or clicks. Gastrointestinal system: Abdomen is nondistended, soft and mildly tender. No organomegaly or masses felt. Normal bowel sounds heard. Central nervous system: Alert and oriented. No focal neurological deficits. Musculoskeletal: No edema. No calf tenderness Skin: No cyanosis. No rashes Psychiatry: Judgement and insight appear normal.     Data Reviewed: I have personally reviewed following labs and imaging studies  CBC Lab Results  Component Value Date  WBC 6.5 01/24/2020   RBC 4.52 01/24/2020   HGB 12.5 01/25/2020   HCT 36.6 01/25/2020   MCV 88.1 01/24/2020   MCH 29.6 01/24/2020   PLT 426 (H) 01/24/2020   MCHC 33.7 01/24/2020   RDW 12.0 01/24/2020   LYMPHSABS 3.6 03/08/2018   MONOABS 0.6 03/08/2018   EOSABS 0.2 03/08/2018    BASOSABS 0.0 03/08/2018     Last metabolic panel Lab Results  Component Value Date   NA 134 (L) 01/25/2020   K 3.7 01/25/2020   CL 97 (L) 01/25/2020   CO2 25 01/25/2020   BUN 24 (H) 01/25/2020   CREATININE 0.91 01/25/2020   GLUCOSE 200 (H) 01/25/2020   GFRNONAA >60 01/25/2020   GFRAA >60 10/03/2019   CALCIUM 9.2 01/25/2020   PHOS 3.5 01/02/2016   PROT 7.9 01/24/2020   ALBUMIN 3.6 01/24/2020   BILITOT 1.0 01/24/2020   ALKPHOS 65 01/24/2020   AST 18 01/24/2020   ALT 18 01/24/2020   ANIONGAP 12 01/25/2020    CBG (last 3)  Recent Labs    01/25/20 0422 01/25/20 0721 01/25/20 1150  GLUCAP 282* 175* 199*     GFR: Estimated Creatinine Clearance: 92.8 mL/min (by C-G formula based on SCr of 0.91 mg/dL).  Coagulation Profile: No results for input(s): INR, PROTIME in the last 168 hours.  Recent Results (from the past 240 hour(s))  Resp Panel by RT-PCR (Flu A&B, Covid) Nasopharyngeal Swab     Status: None   Collection Time: 01/24/20  9:24 PM   Specimen: Nasopharyngeal Swab; Nasopharyngeal(NP) swabs in vial transport medium  Result Value Ref Range Status   SARS Coronavirus 2 by RT PCR NEGATIVE NEGATIVE Final    Comment: (NOTE) SARS-CoV-2 target nucleic acids are NOT DETECTED.  The SARS-CoV-2 RNA is generally detectable in upper respiratory specimens during the acute phase of infection. The lowest concentration of SARS-CoV-2 viral copies this assay can detect is 138 copies/mL. A negative result does not preclude SARS-Cov-2 infection and should not be used as the sole basis for treatment or other patient management decisions. A negative result may occur with  improper specimen collection/handling, submission of specimen other than nasopharyngeal swab, presence of viral mutation(s) within the areas targeted by this assay, and inadequate number of viral copies(<138 copies/mL). A negative result must be combined with clinical observations, patient history, and  epidemiological information. The expected result is Negative.  Fact Sheet for Patients:  BloggerCourse.com  Fact Sheet for Healthcare Providers:  SeriousBroker.it  This test is no t yet approved or cleared by the [place] FDA and  has been authorized for detection and/or diagnosis of SARS-CoV-2 by FDA under an Emergency Use Authorization (EUA). This EUA will remain  in effect (meaning this test can be used) for the duration of the COVID-19 declaration under Section 564(b)(1) of the Act, 21 U.S.C.section 360bbb-3(b)(1), unless the authorization is terminated  or revoked sooner.       Influenza A by PCR NEGATIVE NEGATIVE Final   Influenza B by PCR NEGATIVE NEGATIVE Final    Comment: (NOTE) The Xpert Xpress SARS-CoV-2/FLU/RSV plus assay is intended as an aid in the diagnosis of influenza from Nasopharyngeal swab specimens and should not be used as a sole basis for treatment. Nasal washings and aspirates are unacceptable for Xpert Xpress SARS-CoV-2/FLU/RSV testing.  Fact Sheet for Patients: BloggerCourse.com  Fact Sheet for Healthcare Providers: SeriousBroker.it  This test is not yet approved or cleared by the [place] FDA and has been authorized for detection and/or  diagnosis of SARS-CoV-2 by FDA under an Emergency Use Authorization (EUA). This EUA will remain in effect (meaning this test can be used) for the duration of the COVID-19 declaration under Section 564(b)(1) of the Act, 21 U.S.C. section 360bbb-3(b)(1), unless the authorization is terminated or revoked.  Performed at [facility], [address] [place], [place] [address]         Radiology Studies: DG Chest 2 View  Result Date: 01/24/2020 CLINICAL DATA:  Chest pain and vomiting. EXAM: CHEST - 2 VIEW COMPARISON:  February 21, 2018 FINDINGS: The heart size and mediastinal contours are  within normal limits. Both lungs are clear. The visualized skeletal structures are unremarkable. IMPRESSION: No active cardiopulmonary disease. Electronically Signed   By: Aram Candela M.D.   On: 01/24/2020 21:50   CT ABDOMEN PELVIS W CONTRAST  Result Date: 01/24/2020 CLINICAL DATA:  History of gastric ulcer with a severe abdominal pain. EXAM: CT ABDOMEN AND PELVIS WITH CONTRAST TECHNIQUE: Multidetector CT imaging of the abdomen and pelvis was performed using the standard protocol following bolus administration of intravenous contrast. CONTRAST:  OMNIPAQUE IOHEXOL 300 MG/ML  SOLN COMPARISON:  CT dated 10/03/2019 FINDINGS: Lower chest: The lung bases are clear. The heart size is normal. Hepatobiliary: The liver is normal. Normal gallbladder.There is no biliary ductal dilation. Pancreas: Normal contours without ductal dilatation. No peripancreatic fluid collection. Spleen: Unremarkable. Adrenals/Urinary Tract: --Adrenal glands: Unremarkable. --Right kidney/ureter: No hydronephrosis or radiopaque kidney stones. --Left kidney/ureter: No hydronephrosis or radiopaque kidney stones. --Urinary bladder: Unremarkable. Stomach/Bowel: --Stomach/Duodenum: No hiatal hernia or other gastric abnormality. Normal duodenal course and caliber. --Small bowel: Unremarkable. --Colon: Majority of the colon is underdistended which limits evaluation. It is difficult to exclude underlying infectious or inflammatory colitis especially at the level of the ascending colon and hepatic flexure. --Appendix: Normal. Vascular/Lymphatic: Normal course and caliber of the major abdominal vessels. --No retroperitoneal lymphadenopathy. --No mesenteric lymphadenopathy. --No pelvic or inguinal lymphadenopathy. Reproductive: There is a probable small fibroid in the anterior uterine fundus. Other: No ascites or free air. The abdominal wall is normal. Musculoskeletal. No acute displaced fractures. IMPRESSION: 1. Majority of the colon is  underdistended which limits evaluation. It is difficult to exclude underlying infectious or inflammatory colitis especially at the level of the ascending colon and hepatic flexure. 2. Normal appendix. 3. Fibroid uterus. Electronically Signed   By: Katherine Mantle M.D.   On: 01/24/2020 21:52        Scheduled Meds: . [START ON 01/26/2020] influenza vac split quadrivalent PF  0.5 mL Intramuscular Tomorrow-1000  . insulin aspart  0-9 Units Subcutaneous Q4H  . insulin detemir  12 Units Subcutaneous QHS  . metoCLOPramide (REGLAN) injection  5 mg Intravenous Q8H  . pantoprazole (PROTONIX) IV  40 mg Intravenous Q12H  . sodium chloride flush  3 mL Intravenous Q12H   Continuous Infusions:   LOS: 0 days     Jacquelin Hawking, MD [facility] 01/25/2020, 2:43 PM  If 7PM-7AM, please contact night-coverage [email or web address]

## 2020-01-25 NOTE — H&P (Signed)
History and Physical    [name] SKA:[id number] DOB: [birth year] DOA: 01/24/2020  PCP: Libby Maw, MD   Patient coming from: Home   Chief Complaint: Abdominal pain, N/V   HPI: Kelli Hudson is a 29 y.o. female with medical history significant for uncontrolled insulin-dependent diabetes mellitus and toe ulcer, now presenting to the emergency department with 1 day of upper abdominal pain, nausea, and vomiting.  The patient reports that she developed severe pain in the epigastrium and lower chest this morning and has had recurrent episodes of vomiting.  She has seen some streaks of red blood in the vomitus.  She has had similar episodes previously, underwent EGD at an outside hospital in August 2021 when she was found to have H. pylori and a clean-based linear ulcer at the GE junction.  She completed treatment for H. pylori.  She denies any recent fevers, chills, cough, or shortness of breath.  She has not been lightheaded or presyncopal.  Denies any recent alcohol or NSAID use.  ED Course: Upon arrival to the ED, patient is found to be afebrile, saturating well on room air, tachycardic in the 120s, and with stable blood pressure.  EKG features sinus tachycardia with rate 129 and normal QT interval.  Chest x-rays negative for acute cardiopulmonary disease.  CT of the abdomen and pelvis features and under distended colon, normal-appearing appendix, and no acute findings.  Chemistry panel notable for glucose of 387 without acidosis.  Lipase was normal.  CBC features a mild chronic thrombocytosis.  Pregnancy test was negative and COVID-19 PCR negative.  Patient was given NovoLog, 2 L of IV fluids, Reglan, Zofran, droperidol, Dilaudid, fentanyl, and IV Protonix in the ED.  Review of Systems:  All other systems reviewed and apart from HPI, are negative.  Past Medical History:  Diagnosis Date  . Diabetes mellitus without complication ([place])   . Hypertension     Past  Surgical History:  Procedure Laterality Date  . AMPUTATION TOE Left 03/10/2018   Procedure: AMPUTATION FIFTH TOE;  Surgeon: Trula Slade, DPM;  Location: [facility];  Service: Podiatry;  Laterality: Left;  . ESOPHAGOGASTRODUODENOSCOPY (EGD) WITH PROPOFOL Left 09/08/2015   Procedure: ESOPHAGOGASTRODUODENOSCOPY (EGD) WITH PROPOFOL;  Surgeon: Arta Silence, MD;  Location: [facility] ENDOSCOPY;  Service: Endoscopy;  Laterality: Left;  . WISDOM TOOTH EXTRACTION      Social History:   reports that she has quit smoking. She has never used smokeless tobacco. She reports current alcohol use of about 1.0 standard drink of alcohol per week. She reports that she does not use drugs.  No Known Allergies  Family History  Problem Relation Age of Onset  . Lung cancer Mother   . Bipolar disorder Father      Prior to Admission medications   Medication Sig Start Date End Date Taking? Authorizing Provider  Blood Glucose Monitoring Suppl (TRUE METRIX METER) w/Device KIT 1 Device by Does not apply route 4 (four) times daily. 07/05/19   Libby Maw, MD  doxycycline (VIBRA-TABS) 100 MG tablet Take 1 tablet (100 mg total) by mouth 2 (two) times daily. 12/23/19   Trula Slade, DPM  glucose blood (TRUE METRIX BLOOD GLUCOSE TEST) test strip Use as instructed 07/05/19   Libby Maw, MD  ibuprofen (ADVIL,MOTRIN) 200 MG tablet Take 400-600 mg by mouth every 6 (six) hours as needed for headache or moderate pain.     [provider]  insulin detemir (LEVEMIR) 100 UNIT/ML FlexPen Inject 18 Units  into the skin daily. 07/05/19   Libby Maw, MD  insulin lispro (HUMALOG) 100 UNIT/ML injection Inject 0.05 mLs (5 Units total) into the skin 3 (three) times daily with meals. 07/05/19   Libby Maw, MD  Insulin Pen Needle 31G X 5 MM MISC 1 Device by Does not apply route QID. For use with insulin pens 07/05/19   Libby Maw, MD  mupirocin ointment  (BACTROBAN) 2 % Apply 1 application topically 2 (two) times daily. 12/23/19   Trula Slade, DPM  norethindrone-ethinyl estradiol (LOESTRIN) 1-20 MG-MCG tablet Take 1 tablet by mouth daily.    [provider]  ondansetron (ZOFRAN ODT) 4 MG disintegrating tablet Take 1 tablet (4 mg total) by mouth every 8 (eight) hours as needed for up to 15 doses for nausea or vomiting. 10/03/19   Wyvonnia Dusky, MD  ondansetron (ZOFRAN) 4 MG tablet Take 1 tablet (4 mg total) by mouth every 8 (eight) hours as needed for nausea or vomiting. 10/04/19   Libby Maw, MD  TRUEPLUS LANCETS 28G MISC assist with checking blood sugar TID and qhs Patient not taking: Reported on 07/05/2019 09/10/15   Brayton Caves, PA-C  insulin aspart (NOVOLOG) 100 UNIT/ML FlexPen Inject 5 Units into the skin 3 (three) times daily with meals. 02/23/18 07/05/19  Donne Hazel, MD  promethazine (PHENERGAN) 25 MG tablet Take 1 tablet (25 mg total) by mouth every 8 (eight) hours as needed for nausea or vomiting. Patient not taking: Reported on 07/05/2019 03/10/18 10/04/19  Trula Slade, DPM    Physical Exam: Vitals:   01/24/20 2330 01/25/20 0000 01/25/20 0030 01/25/20 0100  BP: (!) 102/55 (!) 91/49 (!) 145/98 126/88  Pulse: (!) 107 (!) 105 (!) 122 (!) 116  Resp: 11 12 12  (!) 9  Temp:      TempSrc:      SpO2: 98% 97% 100% 100%    Constitutional: NAD, calm  Eyes: PERTLA, lids and conjunctivae normal ENMT: Mucous membranes are moist. Posterior pharynx clear of any exudate or lesions.   Neck: normal, supple, no masses, no thyromegaly Respiratory: clear to auscultation bilaterally, no wheezing, no crackles. No accessory muscle use.  Cardiovascular: Rate ~110 and regular. No extremity edema.   Abdomen: No distension, soft, tender in epigastrium without rebound pain or guarding. Bowel sounds active.  Musculoskeletal: no clubbing / cyanosis. No joint deformity upper and lower extremities.   Skin: no significant  rashes, lesions, ulcers. Warm, dry, well-perfused. Neurologic: CN 2-12 grossly intact. Sensation intact. Moving all extremities.  Psychiatric: Alert and oriented to person, place, and situation. Pleasant and cooperative.    Labs and Imaging on Admission: I have personally reviewed following labs and imaging studies  CBC: Recent Labs  Lab 01/24/20 1352  WBC 6.5  HGB 13.4  HCT 39.8  MCV 88.1  PLT 035*   Basic Metabolic Panel: Recent Labs  Lab 01/24/20 1352  NA 134*  K 4.0  CL 96*  CO2 24  GLUCOSE 387*  BUN 19  CREATININE 0.89  CALCIUM 9.7   GFR: CrCl cannot be calculated (Unknown ideal weight.). Liver Function Tests: Recent Labs  Lab 01/24/20 1352  AST 18  ALT 18  ALKPHOS 65  BILITOT 1.0  PROT 7.9  ALBUMIN 3.6   Recent Labs  Lab 01/24/20 1352  LIPASE 21   No results for input(s): AMMONIA in the last 168 hours. Coagulation Profile: No results for input(s): INR, PROTIME in the last 168 hours. Cardiac Enzymes:  No results for input(s): CKTOTAL, CKMB, CKMBINDEX, TROPONINI in the last 168 hours. BNP (last 3 results) No results for input(s): PROBNP in the last 8760 hours. HbA1C: No results for input(s): HGBA1C in the last 72 hours. CBG: Recent Labs  Lab 01/24/20 1824 01/24/20 2001 01/24/20 2223 01/25/20 0121  GLUCAP 395* 332* 309* 312*   Lipid Profile: No results for input(s): CHOL, HDL, LDLCALC, TRIG, CHOLHDL, LDLDIRECT in the last 72 hours. Thyroid Function Tests: No results for input(s): TSH, T4TOTAL, FREET4, T3FREE, THYROIDAB in the last 72 hours. Anemia Panel: No results for input(s): VITAMINB12, FOLATE, FERRITIN, TIBC, IRON, RETICCTPCT in the last 72 hours. Urine analysis:    Component Value Date/Time   COLORURINE YELLOW 10/03/2019 2100   APPEARANCEUR HAZY (A) 10/03/2019 2100   LABSPEC 1.033 (H) 10/03/2019 2100   PHURINE 5.0 10/03/2019 2100   GLUCOSEU >=500 (A) 10/03/2019 2100   GLUCOSEU >=1000 (A) 07/05/2019 1343   HGBUR SMALL (A)  10/03/2019 2100   BILIRUBINUR NEGATIVE 10/03/2019 2100   KETONESUR 20 (A) 10/03/2019 2100   PROTEINUR 100 (A) 10/03/2019 2100   UROBILINOGEN 0.2 07/05/2019 1343   NITRITE NEGATIVE 10/03/2019 2100   LEUKOCYTESUR NEGATIVE 10/03/2019 2100   Sepsis Labs: @LABRCNTIP (procalcitonin:4,lacticidven:4) ) Recent Results (from the past 240 hour(s))  Resp Panel by RT-PCR (Flu A&B, Covid) Nasopharyngeal Swab     Status: None   Collection Time: 01/24/20  9:24 PM   Specimen: Nasopharyngeal Swab; Nasopharyngeal(NP) swabs in vial transport medium  Result Value Ref Range Status   SARS Coronavirus 2 by RT PCR NEGATIVE NEGATIVE Final    Comment: (NOTE) SARS-CoV-2 target nucleic acids are NOT DETECTED.  The SARS-CoV-2 RNA is generally detectable in upper respiratory specimens during the acute phase of infection. The lowest concentration of SARS-CoV-2 viral copies this assay can detect is 138 copies/mL. A negative result does not preclude SARS-Cov-2 infection and should not be used as the sole basis for treatment or other patient management decisions. A negative result may occur with  improper specimen collection/handling, submission of specimen other than nasopharyngeal swab, presence of viral mutation(s) within the areas targeted by this assay, and inadequate number of viral copies(<138 copies/mL). A negative result must be combined with clinical observations, patient history, and epidemiological information. The expected result is Negative.  Fact Sheet for Patients:  EntrepreneurPulse.com.au  Fact Sheet for Healthcare Providers:  IncredibleEmployment.be  This test is no t yet approved or cleared by the [place] FDA and  has been authorized for detection and/or diagnosis of SARS-CoV-2 by FDA under an Emergency Use Authorization (EUA). This EUA will remain  in effect (meaning this test can be used) for the duration of the COVID-19 declaration under Section  564(b)(1) of the Act, 21 U.S.C.section 360bbb-3(b)(1), unless the authorization is terminated  or revoked sooner.       Influenza A by PCR NEGATIVE NEGATIVE Final   Influenza B by PCR NEGATIVE NEGATIVE Final    Comment: (NOTE) The Xpert Xpress SARS-CoV-2/FLU/RSV plus assay is intended as an aid in the diagnosis of influenza from Nasopharyngeal swab specimens and should not be used as a sole basis for treatment. Nasal washings and aspirates are unacceptable for Xpert Xpress SARS-CoV-2/FLU/RSV testing.  Fact Sheet for Patients: EntrepreneurPulse.com.au  Fact Sheet for Healthcare Providers: IncredibleEmployment.be  This test is not yet approved or cleared by the [place] FDA and has been authorized for detection and/or diagnosis of SARS-CoV-2 by FDA under an Emergency Use Authorization (EUA). This EUA will remain in effect (meaning this  test can be used) for the duration of the COVID-19 declaration under Section 564(b)(1) of the Act, 21 U.S.C. section 360bbb-3(b)(1), unless the authorization is terminated or revoked.  Performed at [facility], [place] [address] [place], [place] [address]      Radiological Exams on Admission: DG Chest 2 View  Result Date: 01/24/2020 CLINICAL DATA:  Chest pain and vomiting. EXAM: CHEST - 2 VIEW COMPARISON:  February 21, 2018 FINDINGS: The heart size and mediastinal contours are within normal limits. Both lungs are clear. The visualized skeletal structures are unremarkable. IMPRESSION: No active cardiopulmonary disease. Electronically Signed   By: Virgina Norfolk M.D.   On: 01/24/2020 21:50   CT ABDOMEN PELVIS W CONTRAST  Result Date: 01/24/2020 CLINICAL DATA:  History of gastric ulcer with a severe abdominal pain. EXAM: CT ABDOMEN AND PELVIS WITH CONTRAST TECHNIQUE: Multidetector CT imaging of the abdomen and pelvis was performed using the standard protocol following bolus administration of  intravenous contrast. CONTRAST:  170m OMNIPAQUE IOHEXOL 300 MG/ML  SOLN COMPARISON:  CT dated 10/03/2019 FINDINGS: Lower chest: The lung bases are clear. The heart size is normal. Hepatobiliary: The liver is normal. Normal gallbladder.There is no biliary ductal dilation. Pancreas: Normal contours without ductal dilatation. No peripancreatic fluid collection. Spleen: Unremarkable. Adrenals/Urinary Tract: --Adrenal glands: Unremarkable. --Right kidney/ureter: No hydronephrosis or radiopaque kidney stones. --Left kidney/ureter: No hydronephrosis or radiopaque kidney stones. --Urinary bladder: Unremarkable. Stomach/Bowel: --Stomach/Duodenum: No hiatal hernia or other gastric abnormality. Normal duodenal course and caliber. --Small bowel: Unremarkable. --Colon: Majority of the colon is underdistended which limits evaluation. It is difficult to exclude underlying infectious or inflammatory colitis especially at the level of the ascending colon and hepatic flexure. --Appendix: Normal. Vascular/Lymphatic: Normal course and caliber of the major abdominal vessels. --No retroperitoneal lymphadenopathy. --No mesenteric lymphadenopathy. --No pelvic or inguinal lymphadenopathy. Reproductive: There is a probable small fibroid in the anterior uterine fundus. Other: No ascites or free air. The abdominal wall is normal. Musculoskeletal. No acute displaced fractures. IMPRESSION: 1. Majority of the colon is underdistended which limits evaluation. It is difficult to exclude underlying infectious or inflammatory colitis especially at the level of the ascending colon and hepatic flexure. 2. Normal appendix. 3. Fibroid uterus. Electronically Signed   By: CConstance HolsterM.D.   On: 01/24/2020 21:52    EKG: Independently reviewed. Sinus tachycardia, rate 119.   Assessment/Plan   1. Intractable abdominal pain with N/V; blood-streaks in vomit   - Presents with 1 day of upper abdominal pain and N/V, reports streaks of blood in vomit    - No acute findings on CT abd/pelvis in ED, no peritoneal signs on exam, she is tachycardic which appears to be chronic and BP and H&H are stable - She has had similar presentations and gastroparesis was suspected  - There has not been any frank hematemesis, she had gastritis and esophagitis on EGD in 2017 and ulcer a GE junction in August 2021; she completed treatment for H. Pylori a few months ago  - Continue supportive care with IVF hydration, antiemetics, and pain-control; continue PPI for now and monitor H&H  2. Uncontrolled IDDM with toe ulcer  - A1c was 13.9% in May 2021; she has toe ulcer that does not appear acutely infected  - Serum glucose 387 in ED without DKA   - Continue CBGs and insulin    DVT prophylaxis: SCDs  Code Status: Full  Family Communication: Discussed with patient  Disposition Plan:  Patient is from: Home  Anticipated d/c  is to: Home  Anticipated d/c date is: 12/8 or 01/26/20 Patient currently: Pending improvement in abdominal pain and N/V, stable H&H  Consults called: none  Admission status: Observation     Vianne Bulls, MD [facility]  01/25/2020, 1:40 AM

## 2020-01-26 ENCOUNTER — Inpatient Hospital Stay (HOSPITAL_COMMUNITY): Payer: Managed Care, Other (non HMO)

## 2020-01-26 DIAGNOSIS — E1065 Type 1 diabetes mellitus with hyperglycemia: Secondary | ICD-10-CM

## 2020-01-26 LAB — CBC
HCT: 35.2 % — ABNORMAL LOW (ref 36.0–46.0)
Hemoglobin: 11.7 g/dL — ABNORMAL LOW (ref 12.0–15.0)
MCH: 29.3 pg (ref 26.0–34.0)
MCHC: 33.2 g/dL (ref 30.0–36.0)
MCV: 88.2 fL (ref 80.0–100.0)
Platelets: 362 10*3/uL (ref 150–400)
RBC: 3.99 MIL/uL (ref 3.87–5.11)
RDW: 12 % (ref 11.5–15.5)
WBC: 9.6 10*3/uL (ref 4.0–10.5)
nRBC: 0 % (ref 0.0–0.2)

## 2020-01-26 LAB — BASIC METABOLIC PANEL
Anion gap: 14 (ref 5–15)
BUN: 14 mg/dL (ref 6–20)
CO2: 21 mmol/L — ABNORMAL LOW (ref 22–32)
Calcium: 8.9 mg/dL (ref 8.9–10.3)
Chloride: 101 mmol/L (ref 98–111)
Creatinine, Ser: 0.71 mg/dL (ref 0.44–1.00)
GFR, Estimated: >60 mL/min (ref >60)
Glucose, Bld: 149 mg/dL — ABNORMAL HIGH (ref 70–99)
Potassium: 3.9 mmol/L (ref 3.5–5.1)
Sodium: 136 mmol/L (ref 135–145)

## 2020-01-26 LAB — GLUCOSE, CAPILLARY
Glucose-Capillary: 87 mg/dL (ref 70–99)
Glucose-Capillary: 223 mg/dL — ABNORMAL HIGH (ref 70–99)
Glucose-Capillary: 181 mg/dL — ABNORMAL HIGH (ref 70–99)
Glucose-Capillary: 166 mg/dL — ABNORMAL HIGH (ref 70–99)
Glucose-Capillary: 148 mg/dL — ABNORMAL HIGH (ref 70–99)

## 2020-01-26 LAB — COMPREHENSIVE METABOLIC PANEL
ALT: 16 U/L (ref 0–44)
AST: 19 U/L (ref 15–41)
Albumin: 2.7 g/dL — ABNORMAL LOW (ref 3.5–5.0)
Alkaline Phosphatase: 50 U/L (ref 38–126)
Anion gap: 10 (ref 5–15)
BUN: 16 mg/dL (ref 6–20)
CO2: 24 mmol/L (ref 22–32)
Calcium: 8.4 mg/dL — ABNORMAL LOW (ref 8.9–10.3)
Chloride: 102 mmol/L (ref 98–111)
Creatinine, Ser: 0.8 mg/dL (ref 0.44–1.00)
GFR, Estimated: >60 mL/min (ref >60)
Glucose, Bld: 133 mg/dL — ABNORMAL HIGH (ref 70–99)
Potassium: 3.3 mmol/L — ABNORMAL LOW (ref 3.5–5.1)
Sodium: 136 mmol/L (ref 135–145)
Total Bilirubin: 1.1 mg/dL (ref 0.3–1.2)
Total Protein: 6.4 g/dL — ABNORMAL LOW (ref 6.5–8.1)

## 2020-01-26 LAB — HEMOGLOBIN A1C
Hgb A1c MFr Bld: 11 % — ABNORMAL HIGH (ref 4.8–5.6)
Mean Plasma Glucose: 269 mg/dL

## 2020-01-26 LAB — LACTIC ACID, PLASMA
Lactic Acid, Venous: 1 mmol/L (ref 0.5–1.9)
Lactic Acid, Venous: 0.9 mmol/L (ref 0.5–1.9)

## 2020-01-26 MED ORDER — SODIUM CHLORIDE 0.9 % IV BOLUS
1000.0000 mL | Freq: Once | INTRAVENOUS | Status: AC
  Administered 2020-01-26: 1000 mL via INTRAVENOUS

## 2020-01-26 NOTE — Progress Notes (Signed)
   01/26/20 1210  Assess: MEWS Score  Temp 98.3 F (36.8 C)  BP (!) 193/112  Pulse Rate (!) 130  Resp 20  SpO2 100 %  Assess: MEWS Score  MEWS Temp 0  MEWS Systolic 0  MEWS Pulse 3  MEWS RR 0  MEWS LOC 0  MEWS Score 3  MEWS Score Color Yellow  Assess: if the MEWS score is Yellow or Red  Were vital signs taken at a resting state? Yes  Focused Assessment No change from prior assessment  Early Detection of Sepsis Score *See Row Information* Low  MEWS guidelines implemented *See Row Information* Yes  Treat  MEWS Interventions Administered prn meds/treatments;Escalated (See documentation below)  Pain Scale 0-10  Pain Score 9  Pain Type Acute pain  Pain Location Abdomen  Pain Orientation Mid  Pain Descriptors / Indicators Aching  Pain Frequency Constant  Pain Onset On-going  Patients Stated Pain Goal 3  Pain Intervention(s) RN made aware;Repositioned (Pt wants to wait for Dilaudid, says Toradol doesn't help)  Take Vital Signs  Increase Vital Sign Frequency  Yellow: Q 2hr X 2 then Q 4hr X 2, if remains yellow, continue Q 4hrs  Escalate  MEWS: Escalate Yellow: discuss with charge nurse/RN and consider discussing with provider and RRT  Notify: Charge Nurse/RN  Name of Charge Nurse/RN Notified Marissa  Date Charge Nurse/RN Notified 01/26/20  Time Charge Nurse/RN Notified 1237  Document  Progress note created (see row info) Yes   PRN Hydralazine given, will reassess.

## 2020-01-26 NOTE — Progress Notes (Signed)
Inpatient Diabetes Program Recommendations  AACE/ADA: New Consensus Statement on Inpatient Glycemic Control (2015)  Target Ranges:  Prepandial:   less than 140 mg/dL      Peak postprandial:   less than 180 mg/dL (1-2 hours)      Critically ill patients:  140 - 180 mg/dL   Lab Results  Component Value Date   GLUCAP 166 (H) 01/26/2020   HGBA1C 11.0 (H) 01/26/2020    Review of Glycemic Control  Diabetes history: DM1 Outpatient Diabetes medications: Basaglar 15 units QHS, Humalog 5 units tidwc (not taking) Current orders for Inpatient glycemic control: Lantus 12 QHS, Novolog 0-9 units Q4H  CBGs 223, 181, 166 mg/dL. Poor po intake  Spoke with pt at bedside regarding her HgbA1C of 11%. Pt was curled up in bed and said she was in pain. States she misses insulin doses occasionally when questioned about HgbA1C of 11%. Said she misses Humalog dose more than Lantus dose. Suggested she set alarm on phone at breakfast, lunch and dinner, to help remember to eat and take her insulin. Pt was not very talkative since in pain.   Will continue to follow glucose trends.   Thank you. Ailene Ards, RD, LDN, CDE Inpatient Diabetes Coordinator [phone]

## 2020-01-26 NOTE — Progress Notes (Signed)
PROGRESS NOTE    Kelli Hudson  QQI:[id number] DOB: January 07, 1991 DOA: 01/24/2020 PCP: Mliss Sax, MD   Brief Narrative: Kelli Hudson is a 29 y.o. female with a history of diabetes mellitus and gastroparesis.  Patient presented secondary to intractable nausea vomiting with associated abdominal pain.  Patient made n.p.o. and started on symptomatic treatment with IV Zofran and IV fluids.   Assessment & Plan:   Principal Problem:   Intractable abdominal pain Active Problems:   Uncontrolled diabetes mellitus (HCC)   Intractable nausea/vomiting Abdominal pain Patient states she has a history of gastroparesis as diagnosed by gastroenterology.  She reports not having a gastric emptying study.  Last time she had symptoms similar to this presentation was in August 2021.  Currently, patient with continued nausea with vomiting and abdominal pain. -Continue Zofran as needed -Continue Reglan 5 mg 3 times daily; risks and benefits were discussed with patient at bedside including the risk of tardive dyskinesia.  Patient voiced understanding of the risks and benefits and was willing to initiate treatment. -Continue Dilaudid IV as needed while n.p.o./vomiting -If no improvement, will consult GI in AM  Diabetes mellitus, type I Patient has type 1 and NOT type 2 diabetes. Uncontrolled with hyperglycemia.  Hemoglobin A1c of 13.9% in May 2021. Current hemoglobin A1C of 11%.  Patient states that she is more adherent with regimen since August 2021.  On care for review patient is supposed to be on Lantus 30 units nightly.  Patient is also prescribed carb correction with a ratio of 1:15 insulin to carb ratio for which she was not following as an outpatient. -Continue Lantus 12 units nightly -Continue sliding scale insulin every 4 hours while n.p.o. -Repeat hemoglobin A1c pending  Toe ulcer Patient follow-up with podiatry as an outpatient.  Elevated blood pressure No prior  history of hypertension. -Hydralazine IV prn  Abnormal thyroid Per recent endocrinology follow-up.  Patient diagnosed with hyperthyroidism but thought to possibly be transient per notes.  Currently not on methimazole.   DVT prophylaxis: Lovenox Code Status:   Code Status: Full Code Family Communication: Mother on telephone Disposition Plan: Discharge home likely in 2 to 5 days pending improvement of nausea vomiting with ability to take p.o.   Consultants:   None  Procedures:   None  Antimicrobials:  None   Subjective: Patient felt a little bit better this morning and had some crackers.  She is now feeling very nauseated.  Objective: Vitals:   01/25/20 1332 01/25/20 1834 01/25/20 2216 01/26/20 0342  BP: (!) 170/112 108/66 (!) 133/93 122/83  Pulse: (!) 120 (!) 106 (!) 109 (!) 103  Resp: 16 16 16 16   Temp: 98.1 F (36.7 C) 98.2 F (36.8 C) 98 F (36.7 C) 98.3 F (36.8 C)  TempSrc: Oral Oral    SpO2: 97% 99% 100% 100%  Weight:      Height:        Intake/Output Summary (Last 24 hours) at 01/26/2020 1134 Last data filed at 01/26/2020 0300 Gross per 24 hour  Intake 1740 ml  Output --  Net 1740 ml   Filed Weights   01/25/20 0235  Weight: 66.6 kg    Examination:  General exam: Appears calm and uncomfortable. Respiratory system: Clear to auscultation. Respiratory effort normal. Cardiovascular system: S1 & S2 heard, RRR. No murmurs, rubs, gallops or clicks. Gastrointestinal system: Abdomen is distended and tender.. Central nervous system: Alert and oriented. No focal neurological deficits. Musculoskeletal: No edema. No calf tenderness Skin: No cyanosis.  No rashes Psychiatry: Judgement and insight appear normal. Mood & affect appropriate.     Data Reviewed: I have personally reviewed following labs and imaging studies  CBC Lab Results  Component Value Date   WBC 6.5 01/24/2020   RBC 4.52 01/24/2020   HGB 12.5 01/25/2020   HCT 37.2 01/25/2020   MCV  88.1 01/24/2020   MCH 29.6 01/24/2020   PLT 426 (H) 01/24/2020   MCHC 33.7 01/24/2020   RDW 12.0 01/24/2020   LYMPHSABS 3.6 03/08/2018   MONOABS 0.6 03/08/2018   EOSABS 0.2 03/08/2018   BASOSABS 0.0 03/08/2018     Last metabolic panel Lab Results  Component Value Date   NA 136 01/26/2020   K 3.9 01/26/2020   CL 101 01/26/2020   CO2 21 (L) 01/26/2020   BUN 14 01/26/2020   CREATININE 0.71 01/26/2020   GLUCOSE 149 (H) 01/26/2020   GFRNONAA >60 01/26/2020   GFRAA >60 10/03/2019   CALCIUM 8.9 01/26/2020   PHOS 3.5 01/02/2016   PROT 7.9 01/24/2020   ALBUMIN 3.6 01/24/2020   BILITOT 1.0 01/24/2020   ALKPHOS 65 01/24/2020   AST 18 01/24/2020   ALT 18 01/24/2020   ANIONGAP 14 01/26/2020    CBG (last 3)  Recent Labs    01/25/20 2352 01/26/20 0344 01/26/20 0739  GLUCAP 135* 87 223*     GFR: Estimated Creatinine Clearance: 105.6 mL/min (by C-G formula based on SCr of 0.71 mg/dL).  Coagulation Profile: No results for input(s): INR, PROTIME in the last 168 hours.  Recent Results (from the past 240 hour(s))  Resp Panel by RT-PCR (Flu A&B, Covid) Nasopharyngeal Swab     Status: None   Collection Time: 01/24/20  9:24 PM   Specimen: Nasopharyngeal Swab; Nasopharyngeal(NP) swabs in vial transport medium  Result Value Ref Range Status   SARS Coronavirus 2 by RT PCR NEGATIVE NEGATIVE Final    Comment: (NOTE) SARS-CoV-2 target nucleic acids are NOT DETECTED.  The SARS-CoV-2 RNA is generally detectable in upper respiratory specimens during the acute phase of infection. The lowest concentration of SARS-CoV-2 viral copies this assay can detect is 138 copies/mL. A negative result does not preclude SARS-Cov-2 infection and should not be used as the sole basis for treatment or other patient management decisions. A negative result may occur with  improper specimen collection/handling, submission of specimen other than nasopharyngeal swab, presence of viral mutation(s) within  the areas targeted by this assay, and inadequate number of viral copies(<138 copies/mL). A negative result must be combined with clinical observations, patient history, and epidemiological information. The expected result is Negative.  Fact Sheet for Patients:  BloggerCourse.com  Fact Sheet for Healthcare Providers:  SeriousBroker.it  This test is no t yet approved or cleared by the [place] FDA and  has been authorized for detection and/or diagnosis of SARS-CoV-2 by FDA under an Emergency Use Authorization (EUA). This EUA will remain  in effect (meaning this test can be used) for the duration of the COVID-19 declaration under Section 564(b)(1) of the Act, 21 U.S.C.section 360bbb-3(b)(1), unless the authorization is terminated  or revoked sooner.       Influenza A by PCR NEGATIVE NEGATIVE Final   Influenza B by PCR NEGATIVE NEGATIVE Final    Comment: (NOTE) The Xpert Xpress SARS-CoV-2/FLU/RSV plus assay is intended as an aid in the diagnosis of influenza from Nasopharyngeal swab specimens and should not be used as a sole basis for treatment. Nasal washings and aspirates are unacceptable for Xpert Xpress SARS-CoV-2/FLU/RSV  testing.  Fact Sheet for Patients: BloggerCourse.com  Fact Sheet for Healthcare Providers: SeriousBroker.it  This test is not yet approved or cleared by the [place] FDA and has been authorized for detection and/or diagnosis of SARS-CoV-2 by FDA under an Emergency Use Authorization (EUA). This EUA will remain in effect (meaning this test can be used) for the duration of the COVID-19 declaration under Section 564(b)(1) of the Act, 21 U.S.C. section 360bbb-3(b)(1), unless the authorization is terminated or revoked.  Performed at [facility], [address] [place], [place] [address]         Radiology Studies: DG Chest 2  View  Result Date: 01/24/2020 CLINICAL DATA:  Chest pain and vomiting. EXAM: CHEST - 2 VIEW COMPARISON:  February 21, 2018 FINDINGS: The heart size and mediastinal contours are within normal limits. Both lungs are clear. The visualized skeletal structures are unremarkable. IMPRESSION: No active cardiopulmonary disease. Electronically Signed   By: Aram Candela M.D.   On: 01/24/2020 21:50   CT ABDOMEN PELVIS W CONTRAST  Result Date: 01/24/2020 CLINICAL DATA:  History of gastric ulcer with a severe abdominal pain. EXAM: CT ABDOMEN AND PELVIS WITH CONTRAST TECHNIQUE: Multidetector CT imaging of the abdomen and pelvis was performed using the standard protocol following bolus administration of intravenous contrast. CONTRAST:  OMNIPAQUE IOHEXOL 300 MG/ML  SOLN COMPARISON:  CT dated 10/03/2019 FINDINGS: Lower chest: The lung bases are clear. The heart size is normal. Hepatobiliary: The liver is normal. Normal gallbladder.There is no biliary ductal dilation. Pancreas: Normal contours without ductal dilatation. No peripancreatic fluid collection. Spleen: Unremarkable. Adrenals/Urinary Tract: --Adrenal glands: Unremarkable. --Right kidney/ureter: No hydronephrosis or radiopaque kidney stones. --Left kidney/ureter: No hydronephrosis or radiopaque kidney stones. --Urinary bladder: Unremarkable. Stomach/Bowel: --Stomach/Duodenum: No hiatal hernia or other gastric abnormality. Normal duodenal course and caliber. --Small bowel: Unremarkable. --Colon: Majority of the colon is underdistended which limits evaluation. It is difficult to exclude underlying infectious or inflammatory colitis especially at the level of the ascending colon and hepatic flexure. --Appendix: Normal. Vascular/Lymphatic: Normal course and caliber of the major abdominal vessels. --No retroperitoneal lymphadenopathy. --No mesenteric lymphadenopathy. --No pelvic or inguinal lymphadenopathy. Reproductive: There is a probable small fibroid in the  anterior uterine fundus. Other: No ascites or free air. The abdominal wall is normal. Musculoskeletal. No acute displaced fractures. IMPRESSION: 1. Majority of the colon is underdistended which limits evaluation. It is difficult to exclude underlying infectious or inflammatory colitis especially at the level of the ascending colon and hepatic flexure. 2. Normal appendix. 3. Fibroid uterus. Electronically Signed   By: Katherine Mantle M.D.   On: 01/24/2020 21:52        Scheduled Meds: . enoxaparin (LOVENOX) injection  40 mg Subcutaneous Q24H  . influenza vac split quadrivalent PF  0.5 mL Intramuscular Tomorrow-1000  . insulin aspart  0-9 Units Subcutaneous Q4H  . insulin glargine  12 Units Subcutaneous QHS  . metoCLOPramide (REGLAN) injection  5 mg Intravenous Q8H  . pantoprazole (PROTONIX) IV  40 mg Intravenous Q12H  . sodium chloride flush  3 mL Intravenous Q12H   Continuous Infusions: . sodium chloride 125 mL/hr at 01/26/20 0741     LOS: 1 day     Jacquelin Hawking, MD [facility] 01/26/2020, 11:34 AM  If 7PM-7AM, please contact night-coverage [email or web address]

## 2020-01-26 NOTE — Progress Notes (Signed)
   01/26/20 1800  Assess: MEWS Score  Temp 98.3 F (36.8 C)  BP (!) 76/54  Pulse Rate (!) 126  Resp 16  Level of Consciousness Alert  SpO2 99 %  O2 Device Room Air  Assess: MEWS Score  MEWS Temp 0  MEWS Systolic 2  MEWS Pulse 2  MEWS RR 0  MEWS LOC 0  MEWS Score 4  MEWS Score Color Red  Assess: if the MEWS score is Yellow or Red  Were vital signs taken at a resting state? Yes  Focused Assessment No change from prior assessment  Early Detection of Sepsis Score *See Row Information* Low  MEWS guidelines implemented *See Row Information* Yes  Treat  MEWS Interventions Escalated (See documentation below)  Pain Scale 0-10  Pain Score 6  Pain Intervention(s) Repositioned  Take Vital Signs  Increase Vital Sign Frequency  Red: Q 1hr X 4 then Q 4hr X 4, if remains red, continue Q 4hrs  Escalate  MEWS: Escalate Red: discuss with charge nurse/RN and provider, consider discussing with RRT  Notify: Charge Nurse/RN  Name of Charge Nurse/RN Notified Marissa  Date Charge Nurse/RN Notified 01/26/20  Time Charge Nurse/RN Notified 1812  Notify: Provider  Provider Name/Title MD Tish Frederickson  Date Provider Notified 01/26/20  Time Provider Notified 1810  Notification Type Face-to-face  Notification Reason Change in status  Response See new orders  Date of Provider Response 01/26/20  Time of Provider Response 1810  Document  Progress note created (see row info) Yes   MD Nettey present in room with patient. See new orders. Will continue to monitor patient. Red MEWS initiated.

## 2020-01-26 NOTE — Progress Notes (Signed)
Paged about patient's BP. Patient with new hypotension with BP as low as 76/54, MAP 61. Associated tachycardia with HR of 120s which is unchanged and in setting of recurrent emesis. Patient received Hydralazine 10 mg IV at 1230. Patient seen and examined at bedside. Patient reports no symptoms except for some lightheadedness after getting up.   On exam, patient appears well perfused; extremities warm. Tachycardia with normal rhythm. Diminished but clear breath sounds. Abdomen is mildly tender but soft. Capillary refill is brisk at about 1 second.  Will order 1 L bolus of NS. CBC, CMP, Lactic acid. Red mews triggered so patient will receive q15 minute vital checks. Continue telemetry monitoring. Will discontinue hydralazine for now.  Jacquelin Hawking, MD [facility] 01/26/2020, 6:22 PM

## 2020-01-27 DIAGNOSIS — R000 Tachycardia, unspecified: Secondary | ICD-10-CM

## 2020-01-27 LAB — BASIC METABOLIC PANEL
Anion gap: 11 (ref 5–15)
BUN: 10 mg/dL (ref 6–20)
CO2: 23 mmol/L (ref 22–32)
Calcium: 8.5 mg/dL — ABNORMAL LOW (ref 8.9–10.3)
Chloride: 99 mmol/L (ref 98–111)
Creatinine, Ser: 0.6 mg/dL (ref 0.44–1.00)
GFR, Estimated: >60 mL/min (ref >60)
Glucose, Bld: 155 mg/dL — ABNORMAL HIGH (ref 70–99)
Potassium: 3.3 mmol/L — ABNORMAL LOW (ref 3.5–5.1)
Sodium: 133 mmol/L — ABNORMAL LOW (ref 135–145)

## 2020-01-27 LAB — GLUCOSE, CAPILLARY
Glucose-Capillary: 113 mg/dL — ABNORMAL HIGH (ref 70–99)
Glucose-Capillary: 131 mg/dL — ABNORMAL HIGH (ref 70–99)
Glucose-Capillary: 134 mg/dL — ABNORMAL HIGH (ref 70–99)
Glucose-Capillary: 138 mg/dL — ABNORMAL HIGH (ref 70–99)
Glucose-Capillary: 149 mg/dL — ABNORMAL HIGH (ref 70–99)
Glucose-Capillary: 94 mg/dL (ref 70–99)
Glucose-Capillary: 98 mg/dL (ref 70–99)

## 2020-01-27 LAB — TSH: TSH: 0.343 u[IU]/mL — ABNORMAL LOW (ref 0.350–4.500)

## 2020-01-27 LAB — T4, FREE: Free T4: 1.73 ng/dL — ABNORMAL HIGH (ref 0.61–1.12)

## 2020-01-27 MED ORDER — SUCRALFATE 1 GM/10ML PO SUSP
1.0000 g | Freq: Three times a day (TID) | ORAL | Status: DC
  Administered 2020-01-27 – 2020-01-30 (×10): 1 g via ORAL
  Filled 2020-01-27 (×10): qty 10

## 2020-01-27 MED ORDER — ONDANSETRON HCL 4 MG/2ML IJ SOLN
4.0000 mg | Freq: Four times a day (QID) | INTRAMUSCULAR | Status: DC
  Administered 2020-01-27 – 2020-01-30 (×11): 4 mg via INTRAVENOUS
  Filled 2020-01-27 (×11): qty 2

## 2020-01-27 MED ORDER — METOCLOPRAMIDE HCL 5 MG/ML IJ SOLN
10.0000 mg | Freq: Three times a day (TID) | INTRAMUSCULAR | Status: DC
  Administered 2020-01-27 – 2020-01-30 (×9): 10 mg via INTRAVENOUS
  Filled 2020-01-27 (×9): qty 2

## 2020-01-27 MED ORDER — HYDRALAZINE HCL 20 MG/ML IJ SOLN
5.0000 mg | Freq: Four times a day (QID) | INTRAMUSCULAR | Status: DC | PRN
  Administered 2020-01-27: 5 mg via INTRAVENOUS
  Filled 2020-01-27: qty 1

## 2020-01-27 NOTE — Progress Notes (Signed)
PROGRESS NOTE    Tecia Cinnamon  PJA:[id number] DOB: [birth year] DOA: 01/24/2020 PCP: Mliss Sax, MD   Brief Narrative: Jimya Ciani is a 29 y.o. female with a history of diabetes mellitus and gastroparesis.  Patient presented secondary to intractable nausea vomiting with associated abdominal pain.  Patient made n.p.o. and started on symptomatic treatment with IV Zofran and IV fluids.   Assessment & Plan:   Principal Problem:   Intractable abdominal pain Active Problems:   Uncontrolled diabetes mellitus (HCC)   Intractable nausea/vomiting Abdominal pain Patient states she has a history of gastroparesis as diagnosed by gastroenterology.  She reports not having a gastric emptying study.  Last time she had symptoms similar to this presentation was in August 2021.  Currently, patient with continued nausea with vomiting and abdominal pain. -Continue Zofran as needed -Continue Reglan 5 mg 3 times daily; risks and benefits were discussed with patient at bedside including the risk of tardive dyskinesia.  Patient voiced understanding of the risks and benefits and was willing to initiate treatment. -Continue Dilaudid IV as needed while n.p.o./vomiting -If no improvement, will consult GI in AM  Diabetes mellitus, type I Patient has type 1 and NOT type 2 diabetes. Uncontrolled with hyperglycemia.  Hemoglobin A1c of 13.9% in May 2021. Current hemoglobin A1C of 11%.  Patient states that she is more adherent with regimen since August 2021.  On care for review patient is supposed to be on Lantus 30 units nightly.  Patient is also prescribed carb correction with a ratio of 1:15 insulin to carb ratio for which she was not following as an outpatient. -Continue Lantus 12 units nightly -Continue sliding scale insulin every 4 hours while n.p.o. -Repeat hemoglobin A1c pending  Sinus tachycardia In setting of nausea/vomiting/abdominal pain. History of questionable  hyperthyroidism. -Repeat TSH  Toe ulcer Patient follow-up with podiatry as an outpatient.  Elevated blood pressure No prior history of hypertension. -Hydralazine IV prn  Abnormal thyroid Per recent endocrinology follow-up.  Patient diagnosed with hyperthyroidism but thought to possibly be transient per notes.  Currently not on methimazole.  Hypotension Transient. Unsure of etiology. No evidence of hypoperfusion on evaluation during episode. Resolved.   DVT prophylaxis: Lovenox Code Status:   Code Status: Full Code Family Communication: Mother on telephone Disposition Plan: Discharge home likely in 2 to 5 days pending improvement of nausea vomiting with ability to take p.o.   Consultants:   None  Procedures:   None  Antimicrobials:  None   Subjective: Patient with recurrent abdominal pain/nausea/vomiting. Improvement yesterday was short-lived.  Objective: Vitals:   01/27/20 0500 01/27/20 0516 01/27/20 0600 01/27/20 0849  BP:  (!) 179/118 (!) 196/135 (!) 180/119  Pulse: (!) 115 (!) 123 (!) 124   Resp: 10 14 14    Temp: 98.4 F (36.9 C)   97.8 F (36.6 C)  TempSrc:    Oral  SpO2: 99% 99% 99%   Weight:      Height:        Intake/Output Summary (Last 24 hours) at 01/27/2020 1000 Last data filed at 01/27/2020 0700 Gross per 24 hour  Intake 1500 ml  Output --  Net 1500 ml   Filed Weights   01/25/20 0235  Weight: 66.6 kg    Examination:  General exam: Appears calm and comfortable Respiratory system: Clear to auscultation. Respiratory effort normal. Cardiovascular system: S1 & S2 heard, tachycardia, regular rhythm. No murmurs, rubs, gallops or clicks. Gastrointestinal system: Abdomen is nondistended, soft and diffusely tender. No organomegaly  or masses felt. Normal bowel sounds heard. Central nervous system: Alert and oriented. No focal neurological deficits. Musculoskeletal: No edema. No calf tenderness Skin: No cyanosis. No rashes Psychiatry:  Judgement and insight appear normal. Mood & affect appropriate.     Data Reviewed: I have personally reviewed following labs and imaging studies  CBC Lab Results  Component Value Date   WBC 9.6 01/26/2020   RBC 3.99 01/26/2020   HGB 11.7 (L) 01/26/2020   HCT 35.2 (L) 01/26/2020   MCV 88.2 01/26/2020   MCH 29.3 01/26/2020   PLT 362 01/26/2020   MCHC 33.2 01/26/2020   RDW 12.0 01/26/2020   LYMPHSABS 3.6 03/08/2018   MONOABS 0.6 03/08/2018   EOSABS 0.2 03/08/2018   BASOSABS 0.0 03/08/2018     Last metabolic panel Lab Results  Component Value Date   NA 133 (L) 01/27/2020   K 3.3 (L) 01/27/2020   CL 99 01/27/2020   CO2 23 01/27/2020   BUN 10 01/27/2020   CREATININE 0.60 01/27/2020   GLUCOSE 155 (H) 01/27/2020   GFRNONAA >60 01/27/2020   GFRAA >60 10/03/2019   CALCIUM 8.5 (L) 01/27/2020   PHOS 3.5 01/02/2016   PROT 6.4 (L) 01/26/2020   ALBUMIN 2.7 (L) 01/26/2020   BILITOT 1.1 01/26/2020   ALKPHOS 50 01/26/2020   AST 19 01/26/2020   ALT 16 01/26/2020   ANIONGAP 11 01/27/2020    CBG (last 3)  Recent Labs    01/27/20 0027 01/27/20 0501 01/27/20 0739  GLUCAP 113* 134* 138*     GFR: Estimated Creatinine Clearance: 105.6 mL/min (by C-G formula based on SCr of 0.6 mg/dL).  Coagulation Profile: No results for input(s): INR, PROTIME in the last 168 hours.  Recent Results (from the past 240 hour(s))  Resp Panel by RT-PCR (Flu A&B, Covid) Nasopharyngeal Swab     Status: None   Collection Time: 01/24/20  9:24 PM   Specimen: Nasopharyngeal Swab; Nasopharyngeal(NP) swabs in vial transport medium  Result Value Ref Range Status   SARS Coronavirus 2 by RT PCR NEGATIVE NEGATIVE Final    Comment: (NOTE) SARS-CoV-2 target nucleic acids are NOT DETECTED.  The SARS-CoV-2 RNA is generally detectable in upper respiratory specimens during the acute phase of infection. The lowest concentration of SARS-CoV-2 viral copies this assay can detect is 138 copies/mL. A negative  result does not preclude SARS-Cov-2 infection and should not be used as the sole basis for treatment or other patient management decisions. A negative result may occur with  improper specimen collection/handling, submission of specimen other than nasopharyngeal swab, presence of viral mutation(s) within the areas targeted by this assay, and inadequate number of viral copies(<138 copies/mL). A negative result must be combined with clinical observations, patient history, and epidemiological information. The expected result is Negative.  Fact Sheet for Patients:  BloggerCourse.com  Fact Sheet for Healthcare Providers:  SeriousBroker.it  This test is no t yet approved or cleared by the [place] FDA and  has been authorized for detection and/or diagnosis of SARS-CoV-2 by FDA under an Emergency Use Authorization (EUA). This EUA will remain  in effect (meaning this test can be used) for the duration of the COVID-19 declaration under Section 564(b)(1) of the Act, 21 U.S.C.section 360bbb-3(b)(1), unless the authorization is terminated  or revoked sooner.       Influenza A by PCR NEGATIVE NEGATIVE Final   Influenza B by PCR NEGATIVE NEGATIVE Final    Comment: (NOTE) The Xpert Xpress SARS-CoV-2/FLU/RSV plus assay is intended as an  aid in the diagnosis of influenza from Nasopharyngeal swab specimens and should not be used as a sole basis for treatment. Nasal washings and aspirates are unacceptable for Xpert Xpress SARS-CoV-2/FLU/RSV testing.  Fact Sheet for Patients: BloggerCourse.com  Fact Sheet for Healthcare Providers: SeriousBroker.it  This test is not yet approved or cleared by the [place] FDA and has been authorized for detection and/or diagnosis of SARS-CoV-2 by FDA under an Emergency Use Authorization (EUA). This EUA will remain in effect (meaning this test can be used)  for the duration of the COVID-19 declaration under Section 564(b)(1) of the Act, 21 U.S.C. section 360bbb-3(b)(1), unless the authorization is terminated or revoked.  Performed at [facility], [address] [place], [place] [address]         Radiology Studies: DG Abd Portable 1V  Result Date: 01/26/2020 CLINICAL DATA:  Nausea vomiting abdominal discomfort EXAM: PORTABLE ABDOMEN - 1 VIEW COMPARISON:  CT 01/24/2020 FINDINGS: The bowel gas pattern is normal. No radio-opaque calculi or other significant radiographic abnormality are seen. Metallic navel ring. Probable phleboliths left pelvis. IMPRESSION: Negative. Electronically Signed   By: Jasmine Pang M.D.   On: 01/26/2020 15:15        Scheduled Meds: . enoxaparin (LOVENOX) injection  40 mg Subcutaneous Q24H  . influenza vac split quadrivalent PF  0.5 mL Intramuscular Tomorrow-1000  . insulin aspart  0-9 Units Subcutaneous Q4H  . insulin glargine  12 Units Subcutaneous QHS  . metoCLOPramide (REGLAN) injection  5 mg Intravenous Q8H  . pantoprazole (PROTONIX) IV  40 mg Intravenous Q12H  . sodium chloride flush  3 mL Intravenous Q12H   Continuous Infusions: . sodium chloride 125 mL/hr at 01/27/20 0227     LOS: 2 days     Jacquelin Hawking, MD [facility] 01/27/2020, 10:00 AM  If 7PM-7AM, please contact night-coverage [email or web address]

## 2020-01-27 NOTE — Progress Notes (Signed)
Patient's pain unrelieved with PRN pain meds and elevated BP due to possible  pain. Notified Katherina Right Np and awaiting new orders. Will continue to monitor.

## 2020-01-27 NOTE — Progress Notes (Signed)
Pt complains of 8/10 uncontrolled abdominal pain. Pt declines heating pack and toradol at this time. MD aware of pain. Pt requesting PRN dilaudid when it is able to be given. Will continue to monitor.

## 2020-01-27 NOTE — Consult Note (Signed)
Referring Provider: Dr. Cordelia Poche Primary Care Physician:  Libby Maw, MD Primary Gastroenterologist:  Althia Forts (previously [facility] pt, now seen by [facility] in [place])  Reason for Consultation:  Intractable nausea/vomiting  HPI: Kelli Hudson is a 29 y.o. female with history of poorly controlled DM, GERD, gastritis (H. pylori positive) and nausea and vomiting presenting for consultation of intractable nausea and vomiting.  Patient reports she was in her usual state of health until Monday 12/6 when she started experiencing epigastric abdominal pain and intractable nausea and vomiting.  She states that the vomit was initially clear, then bilious, then scant red blood mixed in.  She continues to have nausea, vomiting, and epigastric abdominal pain.  Reports dark brown bowel movements but denies melena or hematochezia.  She had an EGD in August 2021 at an outside hospital which revealed esophagitis and H. pylori positive gastritis.  She was treated with 14-day quadruple therapy.  She was also thought to have diabetic gastroparesis as the main contributor to her nausea and vomiting and was given instructions to follow gastroparesis diet.  She denies any NSAID, aspirin, or blood thinner use.  She denies any family history of colon cancer or gastrointestinal malignancy.  She denies any marijuana use.  Drinks alcohol infrequently, last in October.   Past Medical History:  Diagnosis Date  . Diabetes mellitus without complication ([place])   . Hypertension     Past Surgical History:  Procedure Laterality Date  . AMPUTATION TOE Left 03/10/2018   Procedure: AMPUTATION FIFTH TOE;  Surgeon: Trula Slade, DPM;  Location: [facility];  Service: Podiatry;  Laterality: Left;  . ESOPHAGOGASTRODUODENOSCOPY (EGD) WITH PROPOFOL Left 09/08/2015   Procedure: ESOPHAGOGASTRODUODENOSCOPY (EGD) WITH PROPOFOL;  Surgeon: Arta Silence, MD;  Location: [facility] ENDOSCOPY;  Service:  Endoscopy;  Laterality: Left;  . WISDOM TOOTH EXTRACTION      Prior to Admission medications   Medication Sig Start Date End Date Taking? Authorizing Provider  ASHWAGANDHA PO Take 1 tablet by mouth daily.   Yes [provider]  insulin detemir (LEVEMIR) 100 UNIT/ML FlexPen Inject 18 Units into the skin daily. Patient taking differently: Inject 1-6 Units into the skin See admin instructions. Sliding scale 07/05/19  Yes Libby Maw, MD  Insulin Glargine [facility]) 100 UNIT/ML Inject 15 Units into the skin at bedtime.  12/24/19  Yes [provider]  lisinopril (ZESTRIL) 5 MG tablet Take 5 mg by mouth daily.  10/17/19  Yes [provider]  Multiple Vitamin (MULTIVITAMIN WITH MINERALS) TABS tablet Take 1 tablet by mouth daily.   Yes [provider]  mupirocin ointment (BACTROBAN) 2 % Apply 1 application topically 2 (two) times daily. Patient taking differently: Apply 1 application topically 2 (two) times daily as needed. Wound care 12/23/19  Yes Trula Slade, DPM  norethindrone-ethinyl estradiol (LOESTRIN) 1-20 MG-MCG tablet Take 1 tablet by mouth daily.   Yes [provider]  Blood Glucose Monitoring Suppl (TRUE METRIX METER) w/Device KIT 1 Device by Does not apply route 4 (four) times daily. 07/05/19   Libby Maw, MD  doxycycline (VIBRA-TABS) 100 MG tablet Take 1 tablet (100 mg total) by mouth 2 (two) times daily. Patient not taking: Reported on 01/25/2020 12/23/19   Trula Slade, DPM  glucose blood (TRUE METRIX BLOOD GLUCOSE TEST) test strip Use as instructed 07/05/19   Libby Maw, MD  insulin lispro (HUMALOG) 100 UNIT/ML injection Inject 0.05 mLs (5 Units total) into the skin 3 (three) times  daily with meals. Patient not taking: Reported on 01/25/2020 07/05/19   Libby Maw, MD  Insulin Pen Needle 31G X 5 MM MISC 1 Device by Does not apply route QID. For use with insulin pens 07/05/19   Libby Maw, MD  ondansetron (ZOFRAN ODT) 4 MG disintegrating tablet Take 1 tablet (4 mg total) by mouth every 8 (eight) hours as needed for up to 15 doses for nausea or vomiting. Patient not taking: Reported on 01/25/2020 10/03/19   Wyvonnia Dusky, MD  ondansetron (ZOFRAN) 4 MG tablet Take 1 tablet (4 mg total) by mouth every 8 (eight) hours as needed for nausea or vomiting. Patient not taking: Reported on 01/25/2020 10/04/19   Libby Maw, MD  TRUEPLUS LANCETS 28G MISC assist with checking blood sugar TID and qhs Patient not taking: Reported on 07/05/2019 09/10/15   Brayton Caves, PA-C  insulin aspart (NOVOLOG) 100 UNIT/ML FlexPen Inject 5 Units into the skin 3 (three) times daily with meals. 02/23/18 07/05/19  Donne Hazel, MD  promethazine (PHENERGAN) 25 MG tablet Take 1 tablet (25 mg total) by mouth every 8 (eight) hours as needed for nausea or vomiting. Patient not taking: Reported on 07/05/2019 03/10/18 10/04/19  Trula Slade, DPM    Scheduled Meds: . enoxaparin (LOVENOX) injection  40 mg Subcutaneous Q24H  . influenza vac split quadrivalent PF  0.5 mL Intramuscular Tomorrow-1000  . insulin aspart  0-9 Units Subcutaneous Q4H  . insulin glargine  12 Units Subcutaneous QHS  . metoCLOPramide (REGLAN) injection  10 mg Intravenous Q8H  . ondansetron (ZOFRAN) IV  4 mg Intravenous Q6H  . pantoprazole (PROTONIX) IV  40 mg Intravenous Q12H  . sodium chloride flush  3 mL Intravenous Q12H   Continuous Infusions: . sodium chloride 125 mL/hr at 01/27/20 1107   PRN Meds:.acetaminophen **OR** acetaminophen, hydrALAZINE, HYDROmorphone (DILAUDID) injection, ketorolac, ondansetron **OR** ondansetron (ZOFRAN) IV  Allergies as of 01/24/2020  . (No Known Allergies)    Family History  Problem Relation Age of Onset  . Lung cancer Mother   . Bipolar disorder Father     Social History   Socioeconomic History  . Marital status: Significant Other    Spouse name: Not on file  .  Number of children: Not on file  . Years of education: Not on file  . Highest education level: Not on file  Occupational History  . Not on file  Tobacco Use  . Smoking status: Former Research scientist (life sciences)  . Smokeless tobacco: Never Used  Vaping Use  . Vaping Use: Never used  Substance and Sexual Activity  . Alcohol use: Yes    Alcohol/week: 1.0 standard drink    Types: 1 Shots of liquor per week    Comment: occasional   . Drug use: No  . Sexual activity: Yes    Birth control/protection: None    Comment: female partner  Other Topics Concern  . Not on file  Social History Narrative  . Not on file   Social Determinants of Health   Financial Resource Strain: Not on file  Food Insecurity: Not on file  Transportation Needs: Not on file  Physical Activity: Not on file  Stress: Not on file  Social Connections: Not on file  Intimate Partner Violence: Not on file    Review of Systems: Review of Systems  Constitutional: Negative for chills and fever.  HENT: Negative for hearing loss and tinnitus.   Eyes: Negative for pain and redness.  Respiratory: Negative for  cough and shortness of breath.   Cardiovascular: Negative for chest pain and palpitations.  Gastrointestinal: Positive for abdominal pain, heartburn, nausea and vomiting. Negative for blood in stool, constipation, diarrhea and melena.  Genitourinary: Negative for flank pain and hematuria.  Musculoskeletal: Negative for falls and joint pain.  Skin: Negative for itching and rash.  Neurological: Negative for seizures and loss of consciousness.  Endo/Heme/Allergies: Negative for polydipsia. Does not bruise/bleed easily.  Psychiatric/Behavioral: Negative for substance abuse. The patient is not nervous/anxious.      Physical Exam: Vital signs: Vitals:   01/27/20 0600 01/27/20 0849  BP: (!) 196/135 (!) 180/119  Pulse: (!) 124   Resp: 14   Temp:  97.8 F (36.6 C)  SpO2: 99%    Last BM Date: 01/24/20  Physical Exam Vitals  reviewed.  Constitutional:      General: She is not in acute distress.    Appearance: She is ill-appearing.  HENT:     Head: Normocephalic and atraumatic.     Nose: Nose normal. No congestion.  Eyes:     General: No scleral icterus.    Extraocular Movements: Extraocular movements intact.     Conjunctiva/sclera: Conjunctivae normal.  Cardiovascular:     Rate and Rhythm: Regular rhythm. Tachycardia present.     Pulses: Normal pulses.  Pulmonary:     Effort: Pulmonary effort is normal. No respiratory distress.     Breath sounds: Normal breath sounds.  Abdominal:     General: Bowel sounds are normal. There is no distension.     Palpations: Abdomen is soft. There is no mass.     Tenderness: There is abdominal tenderness (epigastric). There is guarding. There is no rebound.     Hernia: No hernia is present.  Musculoskeletal:        General: No swelling or tenderness.     Cervical back: Normal range of motion and neck supple.  Skin:    General: Skin is warm and dry.  Neurological:     General: No focal deficit present.     Mental Status: She is oriented to person, place, and time. She is lethargic.  Psychiatric:        Mood and Affect: Mood normal.        Behavior: Behavior normal. Behavior is cooperative.      GI:  Lab Results: Recent Labs    01/24/20 1352 01/25/20 1019 01/25/20 1632 01/26/20 1823  WBC 6.5  --   --  9.6  HGB 13.4 12.5  --  11.7*  HCT 39.8 36.6 37.2 35.2*  PLT 426*  --   --  362   BMET Recent Labs    01/26/20 0519 01/26/20 1823 01/27/20 0613  NA 136 136 133*  K 3.9 3.3* 3.3*  CL 101 102 99  CO2 21* 24 23  GLUCOSE 149* 133* 155*  BUN 14 16 10   CREATININE 0.71 0.80 0.60  CALCIUM 8.9 8.4* 8.5*   LFT Recent Labs    01/26/20 1823  PROT 6.4*  ALBUMIN 2.7*  AST 19  ALT 16  ALKPHOS 50  BILITOT 1.1   PT/INR No results for input(s): LABPROT, INR in the last 72 hours.   Studies/Results: DG Abd Portable 1V  Result Date:  01/26/2020 CLINICAL DATA:  Nausea vomiting abdominal discomfort EXAM: PORTABLE ABDOMEN - 1 VIEW COMPARISON:  CT 01/24/2020 FINDINGS: The bowel gas pattern is normal. No radio-opaque calculi or other significant radiographic abnormality are seen. Metallic navel ring. Probable phleboliths left pelvis. IMPRESSION: Negative.  Electronically Signed   By: Donavan Foil M.D.   On: 01/26/2020 15:15    Impression: Intractable nausea and vomiting, epigastric abdominal pain: Most likely related to diabetic gastroparesis.   -CT 01/24/2020 unrevealing for source of abdominal pain, plain film yesterday 12/10 unremarkable  Scant hematemesis, most likely esophagitis versus Mallory-Weiss tear. History of esophagitis and H. pylori gastritis in Aug 2021. -Hemoglobin 11.7 yesterday 12/9  Plan: Recommend EGD tomorrow if stable from a cardiac standpoint.  EGD unable to be completed today due to sinus tachycardia (heart rate in the 130s at time of my exam).  Increase Reglan to 10 mg every 8 hours.  Scheduled Zofran, 4 mg every 6 hours, with as needed as needed dosing.  Continue Protonix 40 mg IV twice daily.  Trial of Carafate four times daily.  Clear liquid diet with n.p.o. after midnight.  Limit narcotics, as this will worsen symptoms in the case of diabetic gastroparesis.  Consider gastric emptying study as an outpatient with primary gastroenterologist.  [facility] will [facility].    LOS: 2 days   Salley Slaughter  PA-C 01/27/2020, 11:31 AM  Contact #  [phone]

## 2020-01-27 NOTE — Progress Notes (Signed)
Per Katherina Right NP I can give the Dilaudid PRN  earlier than it's due. She indicated we will not give BP med at this time and see how the pain med will do.

## 2020-01-27 NOTE — H&P (View-Only) (Signed)
Referring Provider: Dr. Cordelia Poche Primary Care Physician:  Libby Maw, MD Primary Gastroenterologist:  Althia Forts (previously [facility] pt, now seen by [facility] in [place])  Reason for Consultation:  Intractable nausea/vomiting  HPI: Kelli Hudson is a 29 y.o. female with history of poorly controlled DM, GERD, gastritis (H. pylori positive) and nausea and vomiting presenting for consultation of intractable nausea and vomiting.  Patient reports she was in her usual state of health until Monday 12/6 when she started experiencing epigastric abdominal pain and intractable nausea and vomiting.  She states that the vomit was initially clear, then bilious, then scant red blood mixed in.  She continues to have nausea, vomiting, and epigastric abdominal pain.  Reports dark brown bowel movements but denies melena or hematochezia.  She had an EGD in August 2021 at an outside hospital which revealed esophagitis and H. pylori positive gastritis.  She was treated with 14-day quadruple therapy.  She was also thought to have diabetic gastroparesis as the main contributor to her nausea and vomiting and was given instructions to follow gastroparesis diet.  She denies any NSAID, aspirin, or blood thinner use.  She denies any family history of colon cancer or gastrointestinal malignancy.  She denies any marijuana use.  Drinks alcohol infrequently, last in October.   Past Medical History:  Diagnosis Date  . Diabetes mellitus without complication ([place])   . Hypertension     Past Surgical History:  Procedure Laterality Date  . AMPUTATION TOE Left 03/10/2018   Procedure: AMPUTATION FIFTH TOE;  Surgeon: Trula Slade, DPM;  Location: [facility];  Service: Podiatry;  Laterality: Left;  . ESOPHAGOGASTRODUODENOSCOPY (EGD) WITH PROPOFOL Left 09/08/2015   Procedure: ESOPHAGOGASTRODUODENOSCOPY (EGD) WITH PROPOFOL;  Surgeon: Arta Silence, MD;  Location: [facility] ENDOSCOPY;  Service:  Endoscopy;  Laterality: Left;  . WISDOM TOOTH EXTRACTION      Prior to Admission medications   Medication Sig Start Date End Date Taking? Authorizing Provider  ASHWAGANDHA PO Take 1 tablet by mouth daily.   Yes [provider]  insulin detemir (LEVEMIR) 100 UNIT/ML FlexPen Inject 18 Units into the skin daily. Patient taking differently: Inject 1-6 Units into the skin See admin instructions. Sliding scale 07/05/19  Yes Libby Maw, MD  Insulin Glargine [facility]) 100 UNIT/ML Inject 15 Units into the skin at bedtime.  12/24/19  Yes [provider]  lisinopril (ZESTRIL) 5 MG tablet Take 5 mg by mouth daily.  10/17/19  Yes [provider]  Multiple Vitamin (MULTIVITAMIN WITH MINERALS) TABS tablet Take 1 tablet by mouth daily.   Yes [provider]  mupirocin ointment (BACTROBAN) 2 % Apply 1 application topically 2 (two) times daily. Patient taking differently: Apply 1 application topically 2 (two) times daily as needed. Wound care 12/23/19  Yes Trula Slade, DPM  norethindrone-ethinyl estradiol (LOESTRIN) 1-20 MG-MCG tablet Take 1 tablet by mouth daily.   Yes [provider]  Blood Glucose Monitoring Suppl (TRUE METRIX METER) w/Device KIT 1 Device by Does not apply route 4 (four) times daily. 07/05/19   Libby Maw, MD  doxycycline (VIBRA-TABS) 100 MG tablet Take 1 tablet (100 mg total) by mouth 2 (two) times daily. Patient not taking: Reported on 01/25/2020 12/23/19   Trula Slade, DPM  glucose blood (TRUE METRIX BLOOD GLUCOSE TEST) test strip Use as instructed 07/05/19   Libby Maw, MD  insulin lispro (HUMALOG) 100 UNIT/ML injection Inject 0.05 mLs (5 Units total) into the skin 3 (three) times  daily with meals. Patient not taking: Reported on 01/25/2020 07/05/19   Libby Maw, MD  Insulin Pen Needle 31G X 5 MM MISC 1 Device by Does not apply route QID. For use with insulin pens 07/05/19   Libby Maw, MD  ondansetron (ZOFRAN ODT) 4 MG disintegrating tablet Take 1 tablet (4 mg total) by mouth every 8 (eight) hours as needed for up to 15 doses for nausea or vomiting. Patient not taking: Reported on 01/25/2020 10/03/19   Wyvonnia Dusky, MD  ondansetron (ZOFRAN) 4 MG tablet Take 1 tablet (4 mg total) by mouth every 8 (eight) hours as needed for nausea or vomiting. Patient not taking: Reported on 01/25/2020 10/04/19   Libby Maw, MD  TRUEPLUS LANCETS 28G MISC assist with checking blood sugar TID and qhs Patient not taking: Reported on 07/05/2019 09/10/15   Brayton Caves, PA-C  insulin aspart (NOVOLOG) 100 UNIT/ML FlexPen Inject 5 Units into the skin 3 (three) times daily with meals. 02/23/18 07/05/19  Donne Hazel, MD  promethazine (PHENERGAN) 25 MG tablet Take 1 tablet (25 mg total) by mouth every 8 (eight) hours as needed for nausea or vomiting. Patient not taking: Reported on 07/05/2019 03/10/18 10/04/19  Trula Slade, DPM    Scheduled Meds: . enoxaparin (LOVENOX) injection  40 mg Subcutaneous Q24H  . influenza vac split quadrivalent PF  0.5 mL Intramuscular Tomorrow-1000  . insulin aspart  0-9 Units Subcutaneous Q4H  . insulin glargine  12 Units Subcutaneous QHS  . metoCLOPramide (REGLAN) injection  10 mg Intravenous Q8H  . ondansetron (ZOFRAN) IV  4 mg Intravenous Q6H  . pantoprazole (PROTONIX) IV  40 mg Intravenous Q12H  . sodium chloride flush  3 mL Intravenous Q12H   Continuous Infusions: . sodium chloride 125 mL/hr at 01/27/20 1107   PRN Meds:.acetaminophen **OR** acetaminophen, hydrALAZINE, HYDROmorphone (DILAUDID) injection, ketorolac, ondansetron **OR** ondansetron (ZOFRAN) IV  Allergies as of 01/24/2020  . (No Known Allergies)    Family History  Problem Relation Age of Onset  . Lung cancer Mother   . Bipolar disorder Father     Social History   Socioeconomic History  . Marital status: Significant Other    Spouse name: Not on file  .  Number of children: Not on file  . Years of education: Not on file  . Highest education level: Not on file  Occupational History  . Not on file  Tobacco Use  . Smoking status: Former Research scientist (life sciences)  . Smokeless tobacco: Never Used  Vaping Use  . Vaping Use: Never used  Substance and Sexual Activity  . Alcohol use: Yes    Alcohol/week: 1.0 standard drink    Types: 1 Shots of liquor per week    Comment: occasional   . Drug use: No  . Sexual activity: Yes    Birth control/protection: None    Comment: female partner  Other Topics Concern  . Not on file  Social History Narrative  . Not on file   Social Determinants of Health   Financial Resource Strain: Not on file  Food Insecurity: Not on file  Transportation Needs: Not on file  Physical Activity: Not on file  Stress: Not on file  Social Connections: Not on file  Intimate Partner Violence: Not on file    Review of Systems: Review of Systems  Constitutional: Negative for chills and fever.  HENT: Negative for hearing loss and tinnitus.   Eyes: Negative for pain and redness.  Respiratory: Negative for  cough and shortness of breath.   Cardiovascular: Negative for chest pain and palpitations.  Gastrointestinal: Positive for abdominal pain, heartburn, nausea and vomiting. Negative for blood in stool, constipation, diarrhea and melena.  Genitourinary: Negative for flank pain and hematuria.  Musculoskeletal: Negative for falls and joint pain.  Skin: Negative for itching and rash.  Neurological: Negative for seizures and loss of consciousness.  Endo/Heme/Allergies: Negative for polydipsia. Does not bruise/bleed easily.  Psychiatric/Behavioral: Negative for substance abuse. The patient is not nervous/anxious.      Physical Exam: Vital signs: Vitals:   01/27/20 0600 01/27/20 0849  BP: (!) 196/135 (!) 180/119  Pulse: (!) 124   Resp: 14   Temp:  97.8 F (36.6 C)  SpO2: 99%    Last BM Date: 01/24/20  Physical Exam Vitals  reviewed.  Constitutional:      General: She is not in acute distress.    Appearance: She is ill-appearing.  HENT:     Head: Normocephalic and atraumatic.     Nose: Nose normal. No congestion.  Eyes:     General: No scleral icterus.    Extraocular Movements: Extraocular movements intact.     Conjunctiva/sclera: Conjunctivae normal.  Cardiovascular:     Rate and Rhythm: Regular rhythm. Tachycardia present.     Pulses: Normal pulses.  Pulmonary:     Effort: Pulmonary effort is normal. No respiratory distress.     Breath sounds: Normal breath sounds.  Abdominal:     General: Bowel sounds are normal. There is no distension.     Palpations: Abdomen is soft. There is no mass.     Tenderness: There is abdominal tenderness (epigastric). There is guarding. There is no rebound.     Hernia: No hernia is present.  Musculoskeletal:        General: No swelling or tenderness.     Cervical back: Normal range of motion and neck supple.  Skin:    General: Skin is warm and dry.  Neurological:     General: No focal deficit present.     Mental Status: She is oriented to person, place, and time. She is lethargic.  Psychiatric:        Mood and Affect: Mood normal.        Behavior: Behavior normal. Behavior is cooperative.      GI:  Lab Results: Recent Labs    01/24/20 1352 01/25/20 1019 01/25/20 1632 01/26/20 1823  WBC 6.5  --   --  9.6  HGB 13.4 12.5  --  11.7*  HCT 39.8 36.6 37.2 35.2*  PLT 426*  --   --  362   BMET Recent Labs    01/26/20 0519 01/26/20 1823 01/27/20 0613  NA 136 136 133*  K 3.9 3.3* 3.3*  CL 101 102 99  CO2 21* 24 23  GLUCOSE 149* 133* 155*  BUN 14 16 10   CREATININE 0.71 0.80 0.60  CALCIUM 8.9 8.4* 8.5*   LFT Recent Labs    01/26/20 1823  PROT 6.4*  ALBUMIN 2.7*  AST 19  ALT 16  ALKPHOS 50  BILITOT 1.1   PT/INR No results for input(s): LABPROT, INR in the last 72 hours.   Studies/Results: DG Abd Portable 1V  Result Date:  01/26/2020 CLINICAL DATA:  Nausea vomiting abdominal discomfort EXAM: PORTABLE ABDOMEN - 1 VIEW COMPARISON:  CT 01/24/2020 FINDINGS: The bowel gas pattern is normal. No radio-opaque calculi or other significant radiographic abnormality are seen. Metallic navel ring. Probable phleboliths left pelvis. IMPRESSION: Negative.  Electronically Signed   By: Donavan Foil M.D.   On: 01/26/2020 15:15    Impression: Intractable nausea and vomiting, epigastric abdominal pain: Most likely related to diabetic gastroparesis.   -CT 01/24/2020 unrevealing for source of abdominal pain, plain film yesterday 12/10 unremarkable  Scant hematemesis, most likely esophagitis versus Mallory-Weiss tear. History of esophagitis and H. pylori gastritis in Aug 2021. -Hemoglobin 11.7 yesterday 12/9  Plan: Recommend EGD tomorrow if stable from a cardiac standpoint.  EGD unable to be completed today due to sinus tachycardia (heart rate in the 130s at time of my exam).  Increase Reglan to 10 mg every 8 hours.  Scheduled Zofran, 4 mg every 6 hours, with as needed as needed dosing.  Continue Protonix 40 mg IV twice daily.  Trial of Carafate four times daily.  Clear liquid diet with n.p.o. after midnight.  Limit narcotics, as this will worsen symptoms in the case of diabetic gastroparesis.  Consider gastric emptying study as an outpatient with primary gastroenterologist.  [facility] will [facility].    LOS: 2 days   Salley Slaughter  PA-C 01/27/2020, 11:31 AM  Contact #  [phone]

## 2020-01-28 ENCOUNTER — Encounter (HOSPITAL_COMMUNITY)
Admission: EM | Disposition: A | Discharge status: Home / Self Care | Payer: Self-pay | Source: Home / Self Care | Attending: Family Medicine

## 2020-01-28 ENCOUNTER — Inpatient Hospital Stay (HOSPITAL_COMMUNITY): Payer: Managed Care, Other (non HMO) | Admitting: Anesthesiology

## 2020-01-28 ENCOUNTER — Encounter (HOSPITAL_COMMUNITY): Payer: Self-pay | Admitting: Family Medicine

## 2020-01-28 HISTORY — PX: ESOPHAGOGASTRODUODENOSCOPY: SHX5428

## 2020-01-28 HISTORY — PX: BIOPSY: SHX5522

## 2020-01-28 LAB — GLUCOSE, CAPILLARY
Glucose-Capillary: 112 mg/dL — ABNORMAL HIGH (ref 70–99)
Glucose-Capillary: 114 mg/dL — ABNORMAL HIGH (ref 70–99)
Glucose-Capillary: 125 mg/dL — ABNORMAL HIGH (ref 70–99)
Glucose-Capillary: 126 mg/dL — ABNORMAL HIGH (ref 70–99)
Glucose-Capillary: 127 mg/dL — ABNORMAL HIGH (ref 70–99)
Glucose-Capillary: 151 mg/dL — ABNORMAL HIGH (ref 70–99)
Glucose-Capillary: 156 mg/dL — ABNORMAL HIGH (ref 70–99)

## 2020-01-28 LAB — BASIC METABOLIC PANEL
Anion gap: 12 (ref 5–15)
BUN: 9 mg/dL (ref 6–20)
CO2: 24 mmol/L (ref 22–32)
Calcium: 8.8 mg/dL — ABNORMAL LOW (ref 8.9–10.3)
Chloride: 98 mmol/L (ref 98–111)
Creatinine, Ser: 0.72 mg/dL (ref 0.44–1.00)
GFR, Estimated: >60 mL/min (ref >60)
Glucose, Bld: 154 mg/dL — ABNORMAL HIGH (ref 70–99)
Potassium: 3.3 mmol/L — ABNORMAL LOW (ref 3.5–5.1)
Sodium: 134 mmol/L — ABNORMAL LOW (ref 135–145)

## 2020-01-28 SURGERY — EGD (ESOPHAGOGASTRODUODENOSCOPY)
Anesthesia: General

## 2020-01-28 MED ORDER — ATENOLOL 25 MG PO TABS
25.0000 mg | ORAL_TABLET | Freq: Two times a day (BID) | ORAL | Status: DC
  Administered 2020-01-28 – 2020-01-30 (×5): 25 mg via ORAL
  Filled 2020-01-28 (×5): qty 1

## 2020-01-28 MED ORDER — LORAZEPAM 2 MG/ML IJ SOLN
0.5000 mg | Freq: Four times a day (QID) | INTRAMUSCULAR | Status: DC | PRN
  Administered 2020-01-28: 22:00:00 0.5 mg via INTRAVENOUS
  Filled 2020-01-28: qty 1

## 2020-01-28 MED ORDER — LIDOCAINE HCL (CARDIAC) PF 100 MG/5ML IV SOSY
PREFILLED_SYRINGE | INTRAVENOUS | Status: DC | PRN
  Administered 2020-01-28: 50 mg via INTRAVENOUS

## 2020-01-28 MED ORDER — FENTANYL CITRATE (PF) 100 MCG/2ML IJ SOLN
INTRAMUSCULAR | Status: DC | PRN
  Administered 2020-01-28: 100 ug via INTRAVENOUS

## 2020-01-28 MED ORDER — POTASSIUM CHLORIDE CRYS ER 20 MEQ PO TBCR
40.0000 meq | EXTENDED_RELEASE_TABLET | ORAL | Status: AC
  Administered 2020-01-28 (×2): 40 meq via ORAL
  Filled 2020-01-28 (×2): qty 2

## 2020-01-28 MED ORDER — ACETAMINOPHEN 500 MG PO TABS
1000.0000 mg | ORAL_TABLET | Freq: Three times a day (TID) | ORAL | Status: DC
  Administered 2020-01-28 – 2020-01-30 (×6): 1000 mg via ORAL
  Filled 2020-01-28 (×6): qty 2

## 2020-01-28 MED ORDER — PROPOFOL 1000 MG/100ML IV EMUL
INTRAVENOUS | Status: AC
  Filled 2020-01-28: qty 100

## 2020-01-28 MED ORDER — PROPOFOL 10 MG/ML IV BOLUS
INTRAVENOUS | Status: DC | PRN
  Administered 2020-01-28: 200 mg via INTRAVENOUS

## 2020-01-28 MED ORDER — FENTANYL CITRATE (PF) 100 MCG/2ML IJ SOLN
INTRAMUSCULAR | Status: AC
  Filled 2020-01-28: qty 2

## 2020-01-28 MED ORDER — ONDANSETRON HCL 4 MG/2ML IJ SOLN
INTRAMUSCULAR | Status: DC | PRN
  Administered 2020-01-28: 4 mg via INTRAVENOUS

## 2020-01-28 MED ORDER — PHENYLEPHRINE HCL (PRESSORS) 10 MG/ML IV SOLN
INTRAVENOUS | Status: DC | PRN
  Administered 2020-01-28 (×2): 120 ug via INTRAVENOUS

## 2020-01-28 MED ORDER — SUCCINYLCHOLINE CHLORIDE 20 MG/ML IJ SOLN
INTRAMUSCULAR | Status: DC | PRN
  Administered 2020-01-28: 120 mg via INTRAVENOUS

## 2020-01-28 NOTE — Anesthesia Postprocedure Evaluation (Signed)
Anesthesia Post Note  Patient: Network engineer  Procedure(s) Performed: ESOPHAGOGASTRODUODENOSCOPY (EGD) (N/A ) BIOPSY     Patient location during evaluation: PACU Anesthesia Type: General Level of consciousness: awake and alert Pain management: pain level controlled Vital Signs Assessment: post-procedure vital signs reviewed and stable Respiratory status: spontaneous breathing, nonlabored ventilation, respiratory function stable and patient connected to nasal cannula oxygen Cardiovascular status: blood pressure returned to baseline and stable Postop Assessment: no apparent nausea or vomiting Anesthetic complications: no   No complications documented.  Last Vitals:  Vitals:   01/28/20 1400 01/28/20 1600  BP:    Pulse:    Resp: 17 11  Temp:    SpO2:      Last Pain:  Vitals:   01/28/20 1042  TempSrc:   PainSc: 10-Worst pain ever                 Kijana Estock S

## 2020-01-28 NOTE — Interval H&P Note (Signed)
History and Physical Interval Note:  01/28/2020 8:09 AM  [name]  has presented today for surgery, with the diagnosis of intractable nausea and vomiting, history of gastric ulcer, history of H pylori.  The various methods of treatment have been discussed with the patient and family. After consideration of risks, benefits and other options for treatment, the patient has consented to  Procedure(s): ESOPHAGOGASTRODUODENOSCOPY (EGD) (N/A) as a surgical intervention.  The patient's history has been reviewed, patient examined, no change in status, stable for surgery.  I have reviewed the patient's chart and labs.  Questions were answered to the patient's satisfaction.    She continues to have epigastric abdominal pain radiating to her substernal area.  Complaining of nausea.  No vomiting today.  Denies any bleeding since Monday.  Last bowel movement on Tuesday.  Risks (bleeding, infection, bowel perforation that could require surgery, sedation-related changes in cardiopulmonary systems), benefits (identification and possible treatment of source of symptoms, exclusion of certain causes of symptoms), and alternatives (watchful waiting, radiographic imaging studies, empiric medical treatment)  were explained to patient n detail and patient wishes to proceed.   Pookela Sellin

## 2020-01-28 NOTE — Op Note (Signed)
[facility] Patient Name: Kelli Hudson Procedure Date: 01/28/2020 MRN: [id number] Attending MD: Kathi Der , MD Date of Birth: 1990/03/29 CSN: [id number] Age: 29 Admit Type: Inpatient Procedure:                Upper GI endoscopy Indications:              Hematemesis, Follow-up of Helicobacter pylori,                            Nausea with vomiting Providers:                Kathi Der, MD, Margaree Mackintosh, RN,                            Michele Mcalpine Technician, Anastasio Champion, CRNA Referring MD:              Medicines:                Sedation Administered by an Anesthesia Professional Complications:            No immediate complications. Estimated Blood Loss:     Estimated blood loss was minimal. Procedure:                Pre-Anesthesia Assessment:                           - Prior to the procedure, a History and Physical                            was performed, and patient medications and                            allergies were reviewed. The patient's tolerance of                            previous anesthesia was also reviewed. The risks                            and benefits of the procedure and the sedation                            options and risks were discussed with the patient.                            All questions were answered, and informed consent                            was obtained. Prior Anticoagulants: The patient has                            taken no previous anticoagulant or antiplatelet                            agents. ASA Grade Assessment: II - A patient with  mild systemic disease. After reviewing the risks                            and benefits, the patient was deemed in                            satisfactory condition to undergo the procedure.                           After obtaining informed consent, the endoscope was                            passed under direct vision.  Throughout the                            procedure, the patient's blood pressure, pulse, and                            oxygen saturations were monitored continuously. The                            [id number]-[id number] ([id number]) was introduced through the                            mouth, and advanced to the second part of duodenum.                            The upper GI endoscopy was accomplished without                            difficulty. The patient tolerated the procedure                            well. Scope In: Scope Out: Findings:      The Z-line was regular and was found 37 cm from the incisors.      The exam of the esophagus was otherwise normal.      Scattered mild inflammation characterized by erythema was found in the       gastric body. Biopsies were taken with a cold forceps for histology.      The cardia and gastric fundus were normal on retroflexion.      There is no endoscopic evidence of bleeding or ulceration in the entire       examined stomach.      The duodenal bulb, first portion of the duodenum and second portion of       the duodenum were normal. Impression:               - Z-line regular, 37 cm from the incisors.                           - Gastritis. Biopsied.                           - Normal duodenal bulb, first portion of the  duodenum and second portion of the duodenum. Moderate Sedation:      Moderate (conscious) sedation was personally administered by an       anesthesia professional. The following parameters were monitored: oxygen       saturation, heart rate, blood pressure, and response to care. Recommendation:           - Return patient to hospital ward for ongoing care.                           - Soft diet.                           - Continue present medications.                           - Await pathology results. Procedure Code(s):        --- Professional ---                           [id number], Esophagogastroduodenoscopy,  flexible,                            transoral; with biopsy, single or multiple Diagnosis Code(s):        --- Professional ---                           K29.70, Gastritis, unspecified, without bleeding                           K92.0, Hematemesis                           B96.81, Helicobacter pylori [H. pylori] as the                            cause of diseases classified elsewhere                           R11.2, Nausea with vomiting, unspecified CPT copyright 2019 [employer]. All rights reserved. The codes documented in this report are preliminary and upon coder review may  be revised to meet current compliance requirements. Kathi Der, MD Kathi Der, MD 01/28/2020 8:41:06 AM Number of Addenda: 0

## 2020-01-28 NOTE — Transfer of Care (Signed)
Immediate Anesthesia Transfer of Care Note  Patient: Briley Bumgarner  Procedure(s) Performed: ESOPHAGOGASTRODUODENOSCOPY (EGD) (N/A ) BIOPSY  Patient Location: PACU  Anesthesia Type:General  Level of Consciousness: awake, alert , oriented and patient cooperative  Airway & Oxygen Therapy: Patient Spontanous Breathing and Patient connected to face mask oxygen  Post-op Assessment: Report given to RN, Post -op Vital signs reviewed and stable and Patient moving all extremities X 4  Post vital signs: stable  Last Vitals:  Vitals Value Taken Time  BP 150/92 01/28/20 0840  Temp 37.1 C 01/28/20 0840  Pulse 126 01/28/20 0848  Resp 18 01/28/20 0848  SpO2 98 % 01/28/20 0848  Vitals shown include unvalidated device data.  Last Pain:  Vitals:   01/28/20 0840  TempSrc: Oral  PainSc: 0-No pain      Patients Stated Pain Goal: 4 (01/27/20 1856)  Complications: No complications documented.

## 2020-01-28 NOTE — Progress Notes (Signed)
PROGRESS NOTE    Kelli Hudson  QAE:[id number] DOB: [birth year] DOA: 01/24/2020 PCP: Mliss Sax, MD   Brief Narrative: Kelli Hudson is a 29 y.o. female with a history of diabetes mellitus and gastroparesis.  Patient presented secondary to intractable nausea vomiting with associated abdominal pain.  Patient made n.p.o. and started on symptomatic treatment with IV Zofran and IV fluids.   Assessment & Plan:   Principal Problem:   Intractable abdominal pain Active Problems:   Uncontrolled diabetes mellitus (HCC)   Intractable nausea/vomiting Abdominal pain Patient states she has a history of gastroparesis as diagnosed by gastroenterology.  She reports not having a gastric emptying study.  Last time she had symptoms similar to this presentation was in August 2021.  Currently, patient with continued nausea with vomiting and abdominal pain. -Continue Zofran as needed -Increased to Reglan 10 mg IV 3 times daily; risks and benefits were discussed with patient at bedside including the risk of tardive dyskinesia.  Patient voiced understanding of the risks and benefits and was willing to initiate treatment. -Continue Dilaudid IV as needed while n.p.o./vomiting -GI recommendations: EGD performed today with mild gastritis -Discussed need to consider TPN if no improvement of symptoms -Continue NS IV fluid  Diabetes mellitus, type I Patient has type 1 and NOT type 2 diabetes. Uncontrolled with hyperglycemia.  Hemoglobin A1c of 13.9% in May 2021. Current hemoglobin A1C of 11%.  Patient states that she is more adherent with regimen since August 2021.  On care for review patient is prescribed Lantus 30 units nightly.  Patient is also prescribed carb correction with a ratio of 1:15 insulin to carb ratio for which she was not following as an outpatient. -Continue Lantus 12 units nightly -Continue sliding scale insulin every 4 hours while n.p.o.  Sinus tachycardia In  setting of nausea/vomiting/abdominal pain. History of possible hyperthyroidism which may be contributing. TSH is mildly low at 0.343 with an elevated free T4 of 1.73 and pending T3. Patient was previously on atenolol -Start atenolol 25 mg BID -Follow-up T3  Toe ulcer Patient follow-up with podiatry as an outpatient. History of left fifth toe amputation  Elevated blood pressure No prior history of hypertension. -Hydralazine IV prn -Atenolol as able  Abnormal thyroid Hyperthyroidism Per recent endocrinology follow-up.  Patient diagnosed with hyperthyroidism but thought to possibly be transient per notes.  Currently not on methimazole. -Restart atenolol  Hypotension Transient. Unsure of etiology. No evidence of hypoperfusion on evaluation during episode. Resolved.   DVT prophylaxis: Lovenox Code Status:   Code Status: Full Code Family Communication: Mother on telephone (14 minutes) Disposition Plan: Discharge home likely in 2 to 5 days pending improvement of nausea vomiting with ability to take p.o.   Consultants:   Gastroenterology  Procedures:   UPPER ENDOSCOPY (01/28/2020)  Antimicrobials:  None   Subjective: Continues to have nausea/vomiting and abdominal pain  Objective: Vitals:   01/28/20 0840 01/28/20 0850 01/28/20 0901 01/28/20 1310  BP: (!) 150/92 (!) 168/107 131/86 (!) 158/101  Pulse: (!) 125 (!) 124 (!) 129 (!) 130  Resp: 13 13 14    Temp: 98.8 F (37.1 C)   98.3 F (36.8 C)  TempSrc: Oral     SpO2: 100% 98% 97% 100%  Weight:      Height:        Intake/Output Summary (Last 24 hours) at 01/28/2020 1359 Last data filed at 01/28/2020 0839 Gross per 24 hour  Intake 3599.67 ml  Output --  Net 3599.67 ml   14/12/2019  Weights   01/25/20 0235 01/28/20 0750  Weight: 66.6 kg 65.8 kg    Examination:  General exam: Appears calm and uncomfortable Respiratory system: Clear to auscultation. Respiratory effort normal. Cardiovascular system: S1 & S2 heard,  RRR. No murmurs, rubs, gallops or clicks. Gastrointestinal system: Abdomen is nondistended, soft and tender. Normal bowel sounds heard. Central nervous system: Alert and oriented. No focal neurological deficits. Musculoskeletal: No edema. No calf tenderness Skin: No cyanosis. No rashes Psychiatry: Judgement and insight appear normal. Mood & affect appropriate.      Data Reviewed: I have personally reviewed following labs and imaging studies  CBC Lab Results  Component Value Date   WBC 9.6 01/26/2020   RBC 3.99 01/26/2020   HGB 11.7 (L) 01/26/2020   HCT 35.2 (L) 01/26/2020   MCV 88.2 01/26/2020   MCH 29.3 01/26/2020   PLT 362 01/26/2020   MCHC 33.2 01/26/2020   RDW 12.0 01/26/2020   LYMPHSABS 3.6 03/08/2018   MONOABS 0.6 03/08/2018   EOSABS 0.2 03/08/2018   BASOSABS 0.0 03/08/2018     Last metabolic panel Lab Results  Component Value Date   NA 134 (L) 01/28/2020   K 3.3 (L) 01/28/2020   CL 98 01/28/2020   CO2 24 01/28/2020   BUN 9 01/28/2020   CREATININE 0.72 01/28/2020   GLUCOSE 154 (H) 01/28/2020   GFRNONAA >60 01/28/2020   GFRAA >60 10/03/2019   CALCIUM 8.8 (L) 01/28/2020   PHOS 3.5 01/02/2016   PROT 6.4 (L) 01/26/2020   ALBUMIN 2.7 (L) 01/26/2020   BILITOT 1.1 01/26/2020   ALKPHOS 50 01/26/2020   AST 19 01/26/2020   ALT 16 01/26/2020   ANIONGAP 12 01/28/2020    CBG (last 3)  Recent Labs    01/28/20 0805 01/28/20 0923 01/28/20 1356  GLUCAP 125* 127* 112*     GFR: Estimated Creatinine Clearance: 105.6 mL/min (by C-G formula based on SCr of 0.72 mg/dL).  Coagulation Profile: No results for input(s): INR, PROTIME in the last 168 hours.  Recent Results (from the past 240 hour(s))  Resp Panel by RT-PCR (Flu A&B, Covid) Nasopharyngeal Swab     Status: None   Collection Time: 01/24/20  9:24 PM   Specimen: Nasopharyngeal Swab; Nasopharyngeal(NP) swabs in vial transport medium  Result Value Ref Range Status   SARS Coronavirus 2 by RT PCR NEGATIVE  NEGATIVE Final    Comment: (NOTE) SARS-CoV-2 target nucleic acids are NOT DETECTED.  The SARS-CoV-2 RNA is generally detectable in upper respiratory specimens during the acute phase of infection. The lowest concentration of SARS-CoV-2 viral copies this assay can detect is 138 copies/mL. A negative result does not preclude SARS-Cov-2 infection and should not be used as the sole basis for treatment or other patient management decisions. A negative result may occur with  improper specimen collection/handling, submission of specimen other than nasopharyngeal swab, presence of viral mutation(s) within the areas targeted by this assay, and inadequate number of viral copies(<138 copies/mL). A negative result must be combined with clinical observations, patient history, and epidemiological information. The expected result is Negative.  Fact Sheet for Patients:  BloggerCourse.com  Fact Sheet for Healthcare Providers:  SeriousBroker.it  This test is no t yet approved or cleared by the [place] FDA and  has been authorized for detection and/or diagnosis of SARS-CoV-2 by FDA under an Emergency Use Authorization (EUA). This EUA will remain  in effect (meaning this test can be used) for the duration of the COVID-19 declaration under Section 564(b)(1) of  the Act, 21 U.S.C.section 360bbb-3(b)(1), unless the authorization is terminated  or revoked sooner.       Influenza A by PCR NEGATIVE NEGATIVE Final   Influenza B by PCR NEGATIVE NEGATIVE Final    Comment: (NOTE) The Xpert Xpress SARS-CoV-2/FLU/RSV plus assay is intended as an aid in the diagnosis of influenza from Nasopharyngeal swab specimens and should not be used as a sole basis for treatment. Nasal washings and aspirates are unacceptable for Xpert Xpress SARS-CoV-2/FLU/RSV testing.  Fact Sheet for Patients: BloggerCourse.com  Fact Sheet for Healthcare  Providers: SeriousBroker.it  This test is not yet approved or cleared by the [place] FDA and has been authorized for detection and/or diagnosis of SARS-CoV-2 by FDA under an Emergency Use Authorization (EUA). This EUA will remain in effect (meaning this test can be used) for the duration of the COVID-19 declaration under Section 564(b)(1) of the Act, 21 U.S.C. section 360bbb-3(b)(1), unless the authorization is terminated or revoked.  Performed at [facility], [address] [place], [place] [address]         Radiology Studies: No results found.      Scheduled Meds: . atenolol  25 mg Oral BID  . enoxaparin (LOVENOX) injection  40 mg Subcutaneous Q24H  . influenza vac split quadrivalent PF  0.5 mL Intramuscular Tomorrow-1000  . insulin aspart  0-9 Units Subcutaneous Q4H  . insulin glargine  12 Units Subcutaneous QHS  . metoCLOPramide (REGLAN) injection  10 mg Intravenous Q8H  . ondansetron (ZOFRAN) IV  4 mg Intravenous Q6H  . pantoprazole (PROTONIX) IV  40 mg Intravenous Q12H  . sodium chloride flush  3 mL Intravenous Q12H  . sucralfate  1 g Oral TID WC & HS   Continuous Infusions: . sodium chloride 125 mL/hr at 01/28/20 0811     LOS: 3 days     Jacquelin Hawking, MD [facility] 01/28/2020, 1:59 PM  If 7PM-7AM, please contact night-coverage [email or web address]

## 2020-01-28 NOTE — Anesthesia Preprocedure Evaluation (Signed)
Anesthesia Evaluation  Patient identified by MRN, date of birth, ID band Patient awake    Reviewed: Allergy & Precautions, H&P , NPO status , Patient's Chart, lab work & pertinent test results  Airway Mallampati: II   Neck ROM: full    Dental   Pulmonary former smoker,    breath sounds clear to auscultation       Cardiovascular hypertension,  Rhythm:regular Rate:Normal     Neuro/Psych PSYCHIATRIC DISORDERS Anxiety Depression  Neuromuscular disease    GI/Hepatic nausea   Endo/Other  diabetes, Insulin Dependent  Renal/GU      Musculoskeletal   Abdominal   Peds  Hematology   Anesthesia Other Findings   Reproductive/Obstetrics                             Anesthesia Physical Anesthesia Plan  ASA: II  Anesthesia Plan: MAC   Post-op Pain Management:    Induction: Intravenous  PONV Risk Score and Plan: 2 and Propofol infusion and Treatment may vary due to age or medical condition  Airway Management Planned: Nasal Cannula  Additional Equipment:   Intra-op Plan:   Post-operative Plan:   Informed Consent: I have reviewed the patients History and Physical, chart, labs and discussed the procedure including the risks, benefits and alternatives for the proposed anesthesia with the patient or authorized representative who has indicated his/her understanding and acceptance.       Plan Discussed with: CRNA, Anesthesiologist and Surgeon  Anesthesia Plan Comments:         Anesthesia Quick Evaluation

## 2020-01-28 NOTE — Anesthesia Procedure Notes (Signed)
Procedure Name: Intubation Date/Time: 01/28/2020 8:18 AM Performed by: Lissa Morales, CRNA Pre-anesthesia Checklist: Patient identified, Emergency Drugs available, Suction available and Patient being monitored Patient Re-evaluated:Patient Re-evaluated prior to induction Oxygen Delivery Method: Circle system utilized Preoxygenation: Pre-oxygenation with 100% oxygen Induction Type: IV induction, Rapid sequence and Cricoid Pressure applied Laryngoscope Size: Mac and 4 Grade View: Grade II Tube type: Oral Tube size: 7.5 mm Number of attempts: 1 Airway Equipment and Method: Stylet and Oral airway Placement Confirmation: ETT inserted through vocal cords under direct vision,  positive ETCO2 and breath sounds checked- equal and bilateral Secured at: 22.5 cm Tube secured with: Tape Dental Injury: Teeth and Oropharynx as per pre-operative assessment

## 2020-01-28 NOTE — Brief Op Note (Signed)
01/24/2020 - 01/28/2020  8:39 AM  PATIENT:  Kelli Hudson  29 y.o. female  PRE-OPERATIVE DIAGNOSIS:  intractable nausea and vomiting, history of gastric ulcer, history of H pylori  POST-OPERATIVE DIAGNOSIS:  gastric biopsy for h.pylori. Mild gastritis   PROCEDURE:  Procedure(s): ESOPHAGOGASTRODUODENOSCOPY (EGD) (N/A) BIOPSY  SURGEON:  Surgeon(s) and Role:    * Alyrica Thurow, MD - Primary  Findings ---------- -EGD showed mild gastritis otherwise negative for ulcer or esophagitis.  Recommendations ----------------------- -Start soft diet and advance as tolerated -Continue PPI -Decrease narcotics -GI will follow  Kathi Der MD, FACP 01/28/2020, 8:40 AM  Contact #  [phone]

## 2020-01-29 ENCOUNTER — Encounter (HOSPITAL_COMMUNITY): Payer: Self-pay | Admitting: Gastroenterology

## 2020-01-29 LAB — T3: T3, Total: 125 ng/dL (ref 71–180)

## 2020-01-29 LAB — BASIC METABOLIC PANEL
Anion gap: 9 (ref 5–15)
BUN: 12 mg/dL (ref 6–20)
CO2: 25 mmol/L (ref 22–32)
Calcium: 8.8 mg/dL — ABNORMAL LOW (ref 8.9–10.3)
Chloride: 103 mmol/L (ref 98–111)
Creatinine, Ser: 0.81 mg/dL (ref 0.44–1.00)
GFR, Estimated: >60 mL/min (ref >60)
Glucose, Bld: 95 mg/dL (ref 70–99)
Potassium: 3.8 mmol/L (ref 3.5–5.1)
Sodium: 137 mmol/L (ref 135–145)

## 2020-01-29 LAB — GLUCOSE, CAPILLARY
Glucose-Capillary: 62 mg/dL — ABNORMAL LOW (ref 70–99)
Glucose-Capillary: 90 mg/dL (ref 70–99)
Glucose-Capillary: 84 mg/dL (ref 70–99)
Glucose-Capillary: 116 mg/dL — ABNORMAL HIGH (ref 70–99)
Glucose-Capillary: 195 mg/dL — ABNORMAL HIGH (ref 70–99)
Glucose-Capillary: 145 mg/dL — ABNORMAL HIGH (ref 70–99)

## 2020-01-29 LAB — MAGNESIUM: Magnesium: 1.3 mg/dL — ABNORMAL LOW (ref 1.7–2.4)

## 2020-01-29 MED ORDER — PANTOPRAZOLE SODIUM 40 MG PO TBEC
40.0000 mg | DELAYED_RELEASE_TABLET | Freq: Every day | ORAL | Status: DC
  Administered 2020-01-30: 10:00:00 40 mg via ORAL
  Filled 2020-01-29: qty 1

## 2020-01-29 MED ORDER — INSULIN ASPART 100 UNIT/ML ~~LOC~~ SOLN
0.0000 [IU] | Freq: Three times a day (TID) | SUBCUTANEOUS | Status: DC
  Administered 2020-01-29: 18:00:00 5 [IU] via SUBCUTANEOUS
  Administered 2020-01-30: 08:00:00 3 [IU] via SUBCUTANEOUS

## 2020-01-29 MED ORDER — INSULIN ASPART 100 UNIT/ML ~~LOC~~ SOLN: 0.0000 [IU] | Freq: Every day | SUBCUTANEOUS | Status: DC

## 2020-01-29 NOTE — Progress Notes (Signed)
[facility] Progress Note  Kelli Hudson 29 y.o. May 14, 1990  CC: Nausea and vomiting, history of H. pylori infection, history of gastroparesis   Subjective: Patient seen and examined at bedside.  Feeling much better today.  Tolerating diet.  Denies any acute GI issues.  ROS : Negative for chest pain and shortness of breath   Objective: Vital signs in last 24 hours: Vitals:   01/29/20 1000 01/29/20 1042  BP:  128/83  Pulse:    Resp: 19   Temp:    SpO2:      Physical Exam:  General:  Alert, cooperative, no distress, appears stated age  Head:  Normocephalic, without obvious abnormality, atraumatic  Eyes:  , EOM's intact,   Lungs:   Clear to auscultation bilaterally, respirations unlabored  Heart:  Regular rate and rhythm, S1, S2 normal  Abdomen:   Soft, non-tender, nondistended, bowel sounds present.  No peritoneal signs  Extremities: Extremities normal, atraumatic, no  edema       Lab Results: Recent Labs    01/28/20 0617 01/29/20 0532  NA 134* 137  K 3.3* 3.8  CL 98 103  CO2 24 25  GLUCOSE 154* 95  BUN 9 12  CREATININE 0.72 0.81  CALCIUM 8.8* 8.8*  MG  --  1.3*   Recent Labs    01/26/20 1823  AST 19  ALT 16  ALKPHOS 50  BILITOT 1.1  PROT 6.4*  ALBUMIN 2.7*   Recent Labs    01/26/20 1823  WBC 9.6  HGB 11.7*  HCT 35.2*  MCV 88.2  PLT 362   No results for input(s): LABPROT, INR in the last 72 hours.    Assessment/Plan: Nausea and vomiting.  Improving.  EGD yesterday showed mild gastritis.  No evidence of esophagitis. Epigastric abdominal pain.  Resolved History of H. pylori infection.  Recommendations ------------------------- -Continue current management with IV Reglan -Change Protonix to p.o.  Continue Carafate -Continue soft diet for now. -Follow pathology results -Hopefully discharge home.   Kathi Der MD, FACP 01/29/2020, 1:09 PM  Contact #  [phone]

## 2020-01-29 NOTE — Progress Notes (Signed)
PROGRESS NOTE    Kelli Hudson  OMV:[id number] DOB: October 14, 1990 DOA: 01/24/2020 PCP: Mliss Sax, MD   Brief Narrative: Kelli Hudson is a 29 y.o. female with a history of diabetes mellitus and gastroparesis.  Patient presented secondary to intractable nausea vomiting with associated abdominal pain.  Patient made n.p.o. and started on symptomatic treatment with IV Zofran and IV fluids.   Assessment & Plan:   Principal Problem:   Intractable abdominal pain Active Problems:   Uncontrolled diabetes mellitus (HCC)   Intractable nausea/vomiting Abdominal pain Patient states she has a history of gastroparesis as diagnosed by gastroenterology.  She reports not having a gastric emptying study.  Last time she had symptoms similar to this presentation was in August 2021.  Currently, patient with continued nausea with vomiting and abdominal pain. -Continue Zofran as needed -Continue Reglan 10 mg IV 3 times daily; risks and benefits were discussed with patient at bedside including the risk of tardive dyskinesia.  Patient voiced understanding of the risks and benefits and was willing to initiate treatment. -Will start Norco prn for pain, continue Tylenol schedule and have dilaudid for if patient has recurrent nausea/vomiting -GI recommendations: EGD performed with mild gastritis; biopsy obtained -Continue NS IV fluid for today until oral intake is much improved  Diabetes mellitus, type I Patient has type 1 and NOT type 2 diabetes. Uncontrolled with hyperglycemia.  Hemoglobin A1c of 13.9% in May 2021. Current hemoglobin A1C of 11%.  Patient states that she is more adherent with regimen since August 2021.  On care for review patient is prescribed Lantus 30 units nightly.  Patient is also prescribed carb correction with a ratio of 1:15 insulin to carb ratio for which she was not following as an outpatient. -Continue Lantus 12 units nightly -SSI  Sinus tachycardia In  setting of nausea/vomiting/abdominal pain. History of possible hyperthyroidism which may be contributing. TSH is mildly low at 0.343 with an elevated free T4 of 1.73 and pending T3. Patient was previously on atenolol -Continue atenolol 25 mg BID -Follow-up T3  Toe ulcer Patient follow-up with podiatry as an outpatient. History of left fifth toe amputation  Elevated blood pressure Patient had issues with elevated blood pressure at previous admission -Hydralazine IV prn -Atenolol 25 mg BID  Abnormal thyroid Hyperthyroidism Per recent endocrinology follow-up.  Patient diagnosed with hyperthyroidism but thought to possibly be transient per notes.  Currently not on methimazole. -Continue atenolol  Hypotension Transient. Unsure of etiology. No evidence of hypoperfusion on evaluation during episode. Resolved.   DVT prophylaxis: Lovenox Code Status:   Code Status: Full Code Family Communication: None at bedside Disposition Plan: Discharge home likely in 1-2 days pending ability to remain stable while on a diet   Consultants:   Gastroenterology  Procedures:   UPPER ENDOSCOPY (01/28/2020) Impression:               - Z-line regular, 37 cm from the incisors.                           - Gastritis. Biopsied.                           - Normal duodenal bulb, first portion of the                            duodenum and second portion of the duodenum.  Recommendation:           - Return patient to hospital ward for ongoing care.                           - Soft diet.                           - Continue present medications.                           - Await pathology results.  Antimicrobials:  None   Subjective: Nausea/vomiting and abdominal pain improved.  Objective: Vitals:   01/29/20 0800 01/29/20 0939 01/29/20 1000 01/29/20 1042  BP:  132/90  128/83  Pulse:  99    Resp: 15  19   Temp:      TempSrc:      SpO2:      Weight:      Height:        Intake/Output Summary  (Last 24 hours) at 01/29/2020 1132 Last data filed at 01/29/2020 0500 Gross per 24 hour  Intake 1107.6 ml  Output --  Net 1107.6 ml   Filed Weights   01/25/20 0235 01/28/20 0750  Weight: 66.6 kg 65.8 kg    Examination:  General exam: Appears calm and comfortable Respiratory system: Clear to auscultation. Respiratory effort normal. Cardiovascular system: S1 & S2 heard, RRR. No murmurs, rubs, gallops or clicks. Gastrointestinal system: Abdomen is nondistended, soft and nontender. No organomegaly or masses felt. Normal bowel sounds heard. Central nervous system: Alert and oriented. No focal neurological deficits. Musculoskeletal: No edema. No calf tenderness Skin: No cyanosis. No rashes Psychiatry: Judgement and insight appear normal. Mood & affect appropriate.     Data Reviewed: I have personally reviewed following labs and imaging studies  CBC Lab Results  Component Value Date   WBC 9.6 01/26/2020   RBC 3.99 01/26/2020   HGB 11.7 (L) 01/26/2020   HCT 35.2 (L) 01/26/2020   MCV 88.2 01/26/2020   MCH 29.3 01/26/2020   PLT 362 01/26/2020   MCHC 33.2 01/26/2020   RDW 12.0 01/26/2020   LYMPHSABS 3.6 03/08/2018   MONOABS 0.6 03/08/2018   EOSABS 0.2 03/08/2018   BASOSABS 0.0 03/08/2018     Last metabolic panel Lab Results  Component Value Date   NA 137 01/29/2020   K 3.8 01/29/2020   CL 103 01/29/2020   CO2 25 01/29/2020   BUN 12 01/29/2020   CREATININE 0.81 01/29/2020   GLUCOSE 95 01/29/2020   GFRNONAA >60 01/29/2020   GFRAA >60 10/03/2019   CALCIUM 8.8 (L) 01/29/2020   PHOS 3.5 01/02/2016   PROT 6.4 (L) 01/26/2020   ALBUMIN 2.7 (L) 01/26/2020   BILITOT 1.1 01/26/2020   ALKPHOS 50 01/26/2020   AST 19 01/26/2020   ALT 16 01/26/2020   ANIONGAP 9 01/29/2020    CBG (last 3)  Recent Labs    01/29/20 0350 01/29/20 0431 01/29/20 0723  GLUCAP 62* 90 84     GFR: Estimated Creatinine Clearance: 104.3 mL/min (by C-G formula based on SCr of 0.81  mg/dL).  Coagulation Profile: No results for input(s): INR, PROTIME in the last 168 hours.  Recent Results (from the past 240 hour(s))  Resp Panel by RT-PCR (Flu A&B, Covid) Nasopharyngeal Swab     Status: None   Collection Time: 01/24/20  9:24 PM   Specimen: Nasopharyngeal  Swab; Nasopharyngeal(NP) swabs in vial transport medium  Result Value Ref Range Status   SARS Coronavirus 2 by RT PCR NEGATIVE NEGATIVE Final    Comment: (NOTE) SARS-CoV-2 target nucleic acids are NOT DETECTED.  The SARS-CoV-2 RNA is generally detectable in upper respiratory specimens during the acute phase of infection. The lowest concentration of SARS-CoV-2 viral copies this assay can detect is 138 copies/mL. A negative result does not preclude SARS-Cov-2 infection and should not be used as the sole basis for treatment or other patient management decisions. A negative result may occur with  improper specimen collection/handling, submission of specimen other than nasopharyngeal swab, presence of viral mutation(s) within the areas targeted by this assay, and inadequate number of viral copies(<138 copies/mL). A negative result must be combined with clinical observations, patient history, and epidemiological information. The expected result is Negative.  Fact Sheet for Patients:  BloggerCourse.com  Fact Sheet for Healthcare Providers:  SeriousBroker.it  This test is no t yet approved or cleared by the [place] FDA and  has been authorized for detection and/or diagnosis of SARS-CoV-2 by FDA under an Emergency Use Authorization (EUA). This EUA will remain  in effect (meaning this test can be used) for the duration of the COVID-19 declaration under Section 564(b)(1) of the Act, 21 U.S.C.section 360bbb-3(b)(1), unless the authorization is terminated  or revoked sooner.       Influenza A by PCR NEGATIVE NEGATIVE Final   Influenza B by PCR NEGATIVE NEGATIVE  Final    Comment: (NOTE) The Xpert Xpress SARS-CoV-2/FLU/RSV plus assay is intended as an aid in the diagnosis of influenza from Nasopharyngeal swab specimens and should not be used as a sole basis for treatment. Nasal washings and aspirates are unacceptable for Xpert Xpress SARS-CoV-2/FLU/RSV testing.  Fact Sheet for Patients: BloggerCourse.com  Fact Sheet for Healthcare Providers: SeriousBroker.it  This test is not yet approved or cleared by the [place] FDA and has been authorized for detection and/or diagnosis of SARS-CoV-2 by FDA under an Emergency Use Authorization (EUA). This EUA will remain in effect (meaning this test can be used) for the duration of the COVID-19 declaration under Section 564(b)(1) of the Act, 21 U.S.C. section 360bbb-3(b)(1), unless the authorization is terminated or revoked.  Performed at [facility], [address], [place], [place] [address]         Radiology Studies: No results found.      Scheduled Meds: . acetaminophen  1,000 mg Oral TID  . atenolol  25 mg Oral BID  . enoxaparin (LOVENOX) injection  40 mg Subcutaneous Q24H  . influenza vac split quadrivalent PF  0.5 mL Intramuscular Tomorrow-1000  . insulin aspart  0-9 Units Subcutaneous Q4H  . insulin glargine  12 Units Subcutaneous QHS  . metoCLOPramide (REGLAN) injection  10 mg Intravenous Q8H  . ondansetron (ZOFRAN) IV  4 mg Intravenous Q6H  . pantoprazole (PROTONIX) IV  40 mg Intravenous Q12H  . sodium chloride flush  3 mL Intravenous Q12H  . sucralfate  1 g Oral TID WC & HS   Continuous Infusions: . sodium chloride 125 mL/hr at 01/29/20 1601     LOS: 4 days     Jacquelin Hawking, MD [facility] 01/29/2020, 11:32 AM  If 7PM-7AM, please contact night-coverage [email or web address]

## 2020-01-30 LAB — BASIC METABOLIC PANEL
Anion gap: 9 (ref 5–15)
BUN: 8 mg/dL (ref 6–20)
CO2: 26 mmol/L (ref 22–32)
Calcium: 8.5 mg/dL — ABNORMAL LOW (ref 8.9–10.3)
Chloride: 102 mmol/L (ref 98–111)
Creatinine, Ser: 0.65 mg/dL (ref 0.44–1.00)
GFR, Estimated: >60 mL/min (ref >60)
Glucose, Bld: 188 mg/dL — ABNORMAL HIGH (ref 70–99)
Potassium: 3.3 mmol/L — ABNORMAL LOW (ref 3.5–5.1)
Sodium: 137 mmol/L (ref 135–145)

## 2020-01-30 LAB — GLUCOSE, CAPILLARY
Glucose-Capillary: 168 mg/dL — ABNORMAL HIGH (ref 70–99)
Glucose-Capillary: 189 mg/dL — ABNORMAL HIGH (ref 70–99)

## 2020-01-30 MED ORDER — SUCRALFATE 1 GM/10ML PO SUSP: 1.0000 g | Freq: Three times a day (TID) | ORAL | 0 refills | Status: DC

## 2020-01-30 MED ORDER — ONDANSETRON 4 MG PO TBDP: 4.0000 mg | ORAL_TABLET | Freq: Three times a day (TID) | ORAL | 0 refills | Status: DC | PRN

## 2020-01-30 MED ORDER — METOCLOPRAMIDE HCL 5 MG PO TABS: 5.0000 mg | ORAL_TABLET | Freq: Three times a day (TID) | ORAL | 2 refills | Status: DC

## 2020-01-30 MED ORDER — ATENOLOL 25 MG PO TABS: 25.0000 mg | ORAL_TABLET | Freq: Two times a day (BID) | ORAL | 2 refills | Status: DC

## 2020-01-30 MED ORDER — PANTOPRAZOLE SODIUM 40 MG PO TBEC: 40.0000 mg | DELAYED_RELEASE_TABLET | Freq: Every day | ORAL | 0 refills | Status: DC

## 2020-01-30 NOTE — Discharge Instructions (Signed)
Kelli Hudson,  You were in the hospital because of severe nausea and vomiting. This may be related to a previous diagnosis of gastroparesis. You were also found to have thyroid issues again (hyperthyroidism). You have improved with your current medication regimen. Please follow-up with your gastroenterologist to set up a gastric emptying study to officially diagnose gastroparesis. Also, please follow-up with your endocrinologist with regard to your hyperthyroidism.

## 2020-01-30 NOTE — Discharge Summary (Signed)
Physician Discharge Summary  Kelli Hudson ZOX:[id number] DOB: 11-22-1990 DOA: 01/24/2020  PCP: Libby Maw, MD  Admit date: 01/24/2020 Discharge date: 01/30/2020  Admitted From: Home Disposition: Home  Recommendations for Outpatient Follow-up:  1. Follow up with PCP in 1 week 2. Follow up with endocrinologist and gastroenterologist in 1-2 weeks 3. Recommend outpatient gastric emptying study 4. Please follow up on the following pending results: Gastric biopsy  Home Health: None Equipment/Devices: None  Discharge Condition: Stable CODE STATUS: Full code Diet recommendation: Carb modified   Brief/Interim Summary:  Admission HPI written by Mitzi Hansen, MD   Chief Complaint: Abdominal pain, N/V   HPI: Kelli Hudson is a 29 y.o. female with medical history significant for uncontrolled insulin-dependent diabetes mellitus and toe ulcer, now presenting to the emergency department with 1 day of upper abdominal pain, nausea, and vomiting.  The patient reports that she developed severe pain in the epigastrium and lower chest this morning and has had recurrent episodes of vomiting.  She has seen some streaks of red blood in the vomitus.  She has had similar episodes previously, underwent EGD at an outside hospital in August 2021 when she was found to have H. pylori and a clean-based linear ulcer at the GE junction.  She completed treatment for H. pylori.  She denies any recent fevers, chills, cough, or shortness of breath.  She has not been lightheaded or presyncopal.  Denies any recent alcohol or NSAID use.   Hospital course:  Intractable nausea/vomiting Abdominal pain Patient states she has a history of gastroparesis as diagnosed by gastroenterology.  She reports not having a gastric emptying study.  Last time she had symptoms similar to this presentation was in August 2021. Patient was managed with an NPO diet, symptom management in addition to initiation  of Reglan IV. Gastroenterology was consulted and added Carafate to regimen. Symptoms improved slowly. EGD performed and was significant for mild gastritis with biopsies obtained. Eventually, patient able to have improved oral intake tolerance with resolution of nausea/vomiting and abdominal pain. Patient to discharge with Carafate, Protonix. Biopsy pending on discharge. Recommend outpatient gastric emptying study.  Diabetes mellitus, type I Patient has type 1 and NOT type 2 diabetes. Uncontrolled with hyperglycemia.  Hemoglobin A1c of 13.9% in May 2021. Current hemoglobin A1C of 11%.  Patient states that she is more adherent with regimen since August 2021.  On care for review patient is prescribed Lantus 30 units nightly.  Patient is also prescribed carb correction with a ratio of 1:15 insulin to carb ratio for which she was not following as an outpatient. Resume home regimen on discharge.  Sinus tachycardia In setting of nausea/vomiting/abdominal pain. History of possible hyperthyroidism which may be contributing. TSH is mildly low at 0.343 with an elevated free T4 of 1.73 and pending T3. Patient was previously on atenolol. Restarted atenolol 25 mg BID.  Toe ulcer Patient follow-up with podiatry as an outpatient. History of left fifth toe amputation.  Elevated blood pressure Patient had issues with elevated blood pressure at previous admission. Initially managed as needed with Hydralazine IV. When patient able to tolerate oral intake more consistently and with continually elevated blood pressure, she was restarted on atenolol 25 mg BID with improvement in blood pressure. Continue on discharge.  Abnormal thyroid Hyperthyroidism Per recent endocrinology follow-up.  Patient diagnosed with hyperthyroidism but thought to possibly be transient per notes.  Currently not on methimazole per endocrinology notes. Restarted atenolol 25 mg BID for symptoms (mainly tachycardia). TSH  of 0.343, free T4 of 1.73  and T3 of 125.  Hypotension Transient. Unsure of etiology. No evidence of hypoperfusion on evaluation during episode. Resolved.  Discharge Diagnoses:  Principal Problem:   Intractable abdominal pain Active Problems:   Uncontrolled diabetes mellitus ([place])   Essential hypertension   Gastroparesis    Discharge Instructions  Discharge Instructions    Call MD for:  persistant nausea and vomiting   Complete by: As directed    Call MD for:  severe uncontrolled pain   Complete by: As directed    Call MD for:  temperature >100.4   Complete by: As directed    Increase activity slowly   Complete by: As directed      Allergies as of 01/30/2020   No Known Allergies     Medication List    STOP taking these medications   doxycycline 100 MG tablet Commonly known as: VIBRA-TABS   insulin detemir 100 UNIT/ML FlexPen Commonly known as: LEVEMIR   ondansetron 4 MG tablet Commonly known as: Zofran     TAKE these medications   ASHWAGANDHA PO Take 1 tablet by mouth daily.   atenolol 25 MG tablet Commonly known as: TENORMIN Take 1 tablet (25 mg total) by mouth 2 (two) times daily.   Basaglar KwikPen 100 UNIT/ML Inject 15 Units into the skin at bedtime.   insulin lispro 100 UNIT/ML injection Commonly known as: HumaLOG Inject 0.05 mLs (5 Units total) into the skin 3 (three) times daily with meals.   Insulin Pen Needle 31G X 5 MM Misc 1 Device by Does not apply route QID. For use with insulin pens   lisinopril 5 MG tablet Commonly known as: ZESTRIL Take 5 mg by mouth daily.   metoCLOPramide 5 MG tablet Commonly known as: Reglan Take 1 tablet (5 mg total) by mouth 3 (three) times daily before meals.   multivitamin with minerals Tabs tablet Take 1 tablet by mouth daily.   mupirocin ointment 2 % Commonly known as: BACTROBAN Apply 1 application topically 2 (two) times daily. What changed:   when to take this  reasons to take this  additional instructions    norethindrone-ethinyl estradiol 1-20 MG-MCG tablet Commonly known as: LOESTRIN Take 1 tablet by mouth daily.   ondansetron 4 MG disintegrating tablet Commonly known as: Zofran ODT Take 1 tablet (4 mg total) by mouth every 8 (eight) hours as needed for nausea or vomiting.   pantoprazole 40 MG tablet Commonly known as: PROTONIX Take 1 tablet (40 mg total) by mouth daily.   True Metrix Blood Glucose Test test strip Generic drug: glucose blood Use as instructed   True Metrix Meter w/Device Kit 1 Device by Does not apply route 4 (four) times daily.   TRUEplus Lancets 28G Misc assist with checking blood sugar TID and qhs       Follow-up Information    Libby Maw, MD. Schedule an appointment as soon as possible for a visit in 1 week(s).   Specialty: Family Medicine Why: Hospital follow-up Contact information: [place] [id number] [phone]              No Known Allergies  Consultations:  Gastroenterology   Procedures/Studies: DG Chest 2 View  Result Date: 01/24/2020 CLINICAL DATA:  Chest pain and vomiting. EXAM: CHEST - 2 VIEW COMPARISON:  February 21, 2018 FINDINGS: The heart size and mediastinal contours are within normal limits. Both lungs are clear. The visualized skeletal structures are unremarkable. IMPRESSION: No active  cardiopulmonary disease. Electronically Signed   By: Virgina Norfolk M.D.   On: 01/24/2020 21:50   CT ABDOMEN PELVIS W CONTRAST  Result Date: 01/24/2020 CLINICAL DATA:  History of gastric ulcer with a severe abdominal pain. EXAM: CT ABDOMEN AND PELVIS WITH CONTRAST TECHNIQUE: Multidetector CT imaging of the abdomen and pelvis was performed using the standard protocol following bolus administration of intravenous contrast. CONTRAST:  145m OMNIPAQUE IOHEXOL 300 MG/ML  SOLN COMPARISON:  CT dated 10/03/2019 FINDINGS: Lower chest: The lung bases are clear. The heart size is normal. Hepatobiliary: The liver is  normal. Normal gallbladder.There is no biliary ductal dilation. Pancreas: Normal contours without ductal dilatation. No peripancreatic fluid collection. Spleen: Unremarkable. Adrenals/Urinary Tract: --Adrenal glands: Unremarkable. --Right kidney/ureter: No hydronephrosis or radiopaque kidney stones. --Left kidney/ureter: No hydronephrosis or radiopaque kidney stones. --Urinary bladder: Unremarkable. Stomach/Bowel: --Stomach/Duodenum: No hiatal hernia or other gastric abnormality. Normal duodenal course and caliber. --Small bowel: Unremarkable. --Colon: Majority of the colon is underdistended which limits evaluation. It is difficult to exclude underlying infectious or inflammatory colitis especially at the level of the ascending colon and hepatic flexure. --Appendix: Normal. Vascular/Lymphatic: Normal course and caliber of the major abdominal vessels. --No retroperitoneal lymphadenopathy. --No mesenteric lymphadenopathy. --No pelvic or inguinal lymphadenopathy. Reproductive: There is a probable small fibroid in the anterior uterine fundus. Other: No ascites or free air. The abdominal wall is normal. Musculoskeletal. No acute displaced fractures. IMPRESSION: 1. Majority of the colon is underdistended which limits evaluation. It is difficult to exclude underlying infectious or inflammatory colitis especially at the level of the ascending colon and hepatic flexure. 2. Normal appendix. 3. Fibroid uterus. Electronically Signed   By: CConstance HolsterM.D.   On: 01/24/2020 21:52   DG Abd Portable 1V  Result Date: 01/26/2020 CLINICAL DATA:  Nausea vomiting abdominal discomfort EXAM: PORTABLE ABDOMEN - 1 VIEW COMPARISON:  CT 01/24/2020 FINDINGS: The bowel gas pattern is normal. No radio-opaque calculi or other significant radiographic abnormality are seen. Metallic navel ring. Probable phleboliths left pelvis. IMPRESSION: Negative. Electronically Signed   By: KDonavan FoilM.D.   On: 01/26/2020 15:15      UPPER  ENDOSCOPY (01/28/2020) Impression: - Z-line regular, 37 cm from the incisors. - Gastritis. Biopsied. - Normal duodenal bulb, first portion of the  duodenum and second portion of the duodenum.  Recommendation: - Return patient to hospital ward for ongoing care. - Soft diet. - Continue present medications. - Await pathology results.   Subjective: Feels much better today.  Discharge Exam: Vitals:   01/29/20 2006 01/30/20 0500  BP:  (!) 152/101  Pulse: 95   Resp:  15  Temp: 98.9 F (37.2 C) 98.5 F (36.9 C)  SpO2:  100%   Vitals:   01/29/20 1000 01/29/20 1042 01/29/20 2006 01/30/20 0500  BP:  128/83  (!) 152/101  Pulse:   95   Resp: 19   15  Temp:   98.9 F (37.2 C) 98.5 F (36.9 C)  TempSrc:   Oral Oral  SpO2:    100%  Weight:      Height:        General exam: Appears calm and comfortable Respiratory system: Clear to auscultation. Respiratory effort normal. Cardiovascular system: S1 & S2 heard, RRR. No murmurs, rubs, gallops or clicks. Gastrointestinal system: Abdomen is nondistended, soft and nontender. No organomegaly or masses felt. Normal bowel sounds heard. Central nervous system: Alert and oriented. No focal neurological deficits. Musculoskeletal: No edema. No calf tenderness Skin: No cyanosis. No rashes  Psychiatry: Judgement and insight appear normal. Mood & affect appropriate.     The results of significant diagnostics from this hospitalization (including imaging, microbiology, ancillary and laboratory) are listed below for reference.     Microbiology: Recent Results (from the past 240 hour(s))  Resp Panel by RT-PCR (Flu A&B, Covid) Nasopharyngeal Swab     Status: None   Collection Time: 01/24/20  9:24 PM   Specimen: Nasopharyngeal Swab; Nasopharyngeal(NP) swabs in vial  transport medium  Result Value Ref Range Status   SARS Coronavirus 2 by RT PCR NEGATIVE NEGATIVE Final    Comment: (NOTE) SARS-CoV-2 target nucleic acids are NOT DETECTED.  The SARS-CoV-2 RNA is generally detectable in upper respiratory specimens during the acute phase of infection. The lowest concentration of SARS-CoV-2 viral copies this assay can detect is 138 copies/mL. A negative result does not preclude SARS-Cov-2 infection and should not be used as the sole basis for treatment or other patient management decisions. A negative result may occur with  improper specimen collection/handling, submission of specimen other than nasopharyngeal swab, presence of viral mutation(s) within the areas targeted by this assay, and inadequate number of viral copies(<138 copies/mL). A negative result must be combined with clinical observations, patient history, and epidemiological information. The expected result is Negative.  Fact Sheet for Patients:  EntrepreneurPulse.com.au  Fact Sheet for Healthcare Providers:  IncredibleEmployment.be  This test is no t yet approved or cleared by the [place] FDA and  has been authorized for detection and/or diagnosis of SARS-CoV-2 by FDA under an Emergency Use Authorization (EUA). This EUA will remain  in effect (meaning this test can be used) for the duration of the COVID-19 declaration under Section 564(b)(1) of the Act, 21 U.S.C.section 360bbb-3(b)(1), unless the authorization is terminated  or revoked sooner.       Influenza A by PCR NEGATIVE NEGATIVE Final   Influenza B by PCR NEGATIVE NEGATIVE Final    Comment: (NOTE) The Xpert Xpress SARS-CoV-2/FLU/RSV plus assay is intended as an aid in the diagnosis of influenza from Nasopharyngeal swab specimens and should not be used as a sole basis for treatment. Nasal washings and aspirates are unacceptable for Xpert Xpress SARS-CoV-2/FLU/RSV testing.  Fact  Sheet for Patients: EntrepreneurPulse.com.au  Fact Sheet for Healthcare Providers: IncredibleEmployment.be  This test is not yet approved or cleared by the [place] FDA and has been authorized for detection and/or diagnosis of SARS-CoV-2 by FDA under an Emergency Use Authorization (EUA). This EUA will remain in effect (meaning this test can be used) for the duration of the COVID-19 declaration under Section 564(b)(1) of the Act, 21 U.S.C. section 360bbb-3(b)(1), unless the authorization is terminated or revoked.  Performed at [facility], [place] [address], [place], [place] [address]      Labs: BNP (last 3 results) No results for input(s): BNP in the last 8760 hours. Basic Metabolic Panel: Recent Labs  Lab 01/26/20 1823 01/27/20 0613 01/28/20 0617 01/29/20 0532 01/30/20 0507  NA 136 133* 134* 137 137  K 3.3* 3.3* 3.3* 3.8 3.3*  CL 102 99 98 103 102  CO2 _0 GLUCOSE 133* 155* 154* 95 188*  BUN _1 CREATININE 0.80 0.60 0.72 0.81 0.65  CALCIUM 8.4* 8.5* 8.8* 8.8* 8.5*  MG  --   --   --  1.3*  --    Liver Function Tests: Recent Labs  Lab 01/24/20 1352 01/26/20 1823  AST 18 19  ALT 18 16  ALKPHOS 65 50  BILITOT 1.0 1.1  PROT 7.9 6.4*  ALBUMIN 3.6 2.7*   Recent Labs  Lab 01/24/20 1352  LIPASE 21   No results for input(s): AMMONIA in the last 168 hours. CBC: Recent Labs  Lab 01/24/20 1352 01/25/20 1019 01/25/20 1632 01/26/20 1823  WBC 6.5  --   --  9.6  HGB 13.4 12.5  --  11.7*  HCT 39.8 36.6 37.2 35.2*  MCV 88.1  --   --  88.2  PLT 426*  --   --  362   Cardiac Enzymes: No results for input(s): CKTOTAL, CKMB, CKMBINDEX, TROPONINI in the last 168 hours. BNP: Invalid input(s): POCBNP CBG: Recent Labs  Lab 01/29/20 1134 01/29/20 1620 01/29/20 2004 01/30/20 0007 01/30/20 0715  GLUCAP 116* 195* 145* 189* 168*   D-Dimer No results for input(s): DDIMER in the last 72  hours. Hgb A1c No results for input(s): HGBA1C in the last 72 hours. Lipid Profile No results for input(s): CHOL, HDL, LDLCALC, TRIG, CHOLHDL, LDLDIRECT in the last 72 hours. Thyroid function studies Recent Labs    01/27/20 1058  TSH 0.343*   Anemia work up No results for input(s): VITAMINB12, FOLATE, FERRITIN, TIBC, IRON, RETICCTPCT in the last 72 hours. Urinalysis    Component Value Date/Time   COLORURINE YELLOW 01/25/2020 0130   APPEARANCEUR CLEAR 01/25/2020 0130   LABSPEC 1.015 01/25/2020 0130   PHURINE 5.0 01/25/2020 0130   GLUCOSEU >=500 (A) 01/25/2020 0130   GLUCOSEU >=1000 (A) 07/05/2019 1343   HGBUR SMALL (A) 01/25/2020 0130   BILIRUBINUR NEGATIVE 01/25/2020 0130   KETONESUR 20 (A) 01/25/2020 0130   PROTEINUR 100 (A) 01/25/2020 0130   UROBILINOGEN 0.2 07/05/2019 1343   NITRITE NEGATIVE 01/25/2020 0130   LEUKOCYTESUR NEGATIVE 01/25/2020 0130   Sepsis Labs Invalid input(s): PROCALCITONIN,  WBC,  LACTICIDVEN Microbiology Recent Results (from the past 240 hour(s))  Resp Panel by RT-PCR (Flu A&B, Covid) Nasopharyngeal Swab     Status: None   Collection Time: 01/24/20  9:24 PM   Specimen: Nasopharyngeal Swab; Nasopharyngeal(NP) swabs in vial transport medium  Result Value Ref Range Status   SARS Coronavirus 2 by RT PCR NEGATIVE NEGATIVE Final    Comment: (NOTE) SARS-CoV-2 target nucleic acids are NOT DETECTED.  The SARS-CoV-2 RNA is generally detectable in upper respiratory specimens during the acute phase of infection. The lowest concentration of SARS-CoV-2 viral copies this assay can detect is 138 copies/mL. A negative result does not preclude SARS-Cov-2 infection and should not be used as the sole basis for treatment or other patient management decisions. A negative result may occur with  improper specimen collection/handling, submission of specimen other than nasopharyngeal swab, presence of viral mutation(s) within the areas targeted by this assay, and  inadequate number of viral copies(<138 copies/mL). A negative result must be combined with clinical observations, patient history, and epidemiological information. The expected result is Negative.  Fact Sheet for Patients:  EntrepreneurPulse.com.au  Fact Sheet for Healthcare Providers:  IncredibleEmployment.be  This test is no t yet approved or cleared by the [place] FDA and  has been authorized for detection and/or diagnosis of SARS-CoV-2 by FDA under an Emergency Use Authorization (EUA). This EUA will remain  in effect (meaning this test can be used) for the duration of the COVID-19 declaration under Section 564(b)(1) of the Act, 21 U.S.C.section 360bbb-3(b)(1), unless the authorization is terminated  or revoked sooner.       Influenza A by PCR NEGATIVE NEGATIVE Final   Influenza  B by PCR NEGATIVE NEGATIVE Final    Comment: (NOTE) The Xpert Xpress SARS-CoV-2/FLU/RSV plus assay is intended as an aid in the diagnosis of influenza from Nasopharyngeal swab specimens and should not be used as a sole basis for treatment. Nasal washings and aspirates are unacceptable for Xpert Xpress SARS-CoV-2/FLU/RSV testing.  Fact Sheet for Patients: EntrepreneurPulse.com.au  Fact Sheet for Healthcare Providers: IncredibleEmployment.be  This test is not yet approved or cleared by the [place] FDA and has been authorized for detection and/or diagnosis of SARS-CoV-2 by FDA under an Emergency Use Authorization (EUA). This EUA will remain in effect (meaning this test can be used) for the duration of the COVID-19 declaration under Section 564(b)(1) of the Act, 21 U.S.C. section 360bbb-3(b)(1), unless the authorization is terminated or revoked.  Performed at [facility], [place] [address] [place], [place] [address]      Time coordinating discharge: 35 minutes  SIGNED:   Cordelia Poche,  MD [facility] 01/30/2020, 9:46 AM

## 2020-01-30 NOTE — Progress Notes (Signed)
[facility] Progress Note  Para Cossey 29 y.o. [birth year]  CC:  Intractable nausea/vomiting   Subjective: Patient reports feeling much better.  Denies any abdominal pain, nausea, or vomiting.  She is tolerating a diet.  She states she is looking forward to discharge.  ROS : Review of Systems  Cardiovascular: Negative for chest pain and palpitations.  Gastrointestinal: Negative for abdominal pain, blood in stool, constipation, diarrhea, heartburn, melena, nausea and vomiting.    Objective: Vital signs in last 24 hours: Vitals:   01/29/20 2006 01/30/20 0500  BP:  (!) 152/101  Pulse: 95   Resp:  15  Temp: 98.9 F (37.2 C) 98.5 F (36.9 C)  SpO2:  100%    Physical Exam:  General:  Alert, cooperative, no distress, appears stated age  Head:  Normocephalic, without obvious abnormality, atraumatic  Eyes:  Anicteric sclera, EOMs intact  Lungs:   Clear to auscultation bilaterally, respirations unlabored  Heart:  Regular rate and rhythm, S1, S2 normal  Abdomen:   Soft, non-tender, non-distended,bowel sounds active all four quadrants,  no guarding or peritoneal signs  Extremities: Extremities normal, atraumatic, no  edema  Pulses: 2+ and symmetric    Lab Results: Recent Labs    01/29/20 0532 01/30/20 0507  NA 137 137  K 3.8 3.3*  CL 103 102  CO2 25 26  GLUCOSE 95 188*  BUN 12 8  CREATININE 0.81 0.65  CALCIUM 8.8* 8.5*  MG 1.3*  --    No results for input(s): AST, ALT, ALKPHOS, BILITOT, PROT, ALBUMIN in the last 72 hours. No results for input(s): WBC, NEUTROABS, HGB, HCT, MCV, PLT in the last 72 hours. No results for input(s): LABPROT, INR in the last 72 hours.    Assessment: Intractable nausea/vomiting and epigastric pain, resolved, most likely from gastroparesis.  EGD 12/11 revealed mild gastritis, path pending.  Plan: Continue home dose of Reglan (5 mg PO before meals).  Continue Protonix 40 mg PO daily.  Continue Carafate QID for a total  of 14 days.  Await gastric biopsies, if positive for H pylori, recommend re-treatment with an alternate regimen.  Follow up with primary gastroenterologist, [facility].  OK to discharge from a GI standpoint. [facility] will sign off.  Please contact us if we can be of any further assistance during this hospital stay.  Edrick Kins PA-C 01/30/2020, 9:53 AM  Contact #  [phone]

## 2020-01-31 ENCOUNTER — Telehealth: Payer: Self-pay

## 2020-01-31 ENCOUNTER — Other Ambulatory Visit: Payer: Self-pay

## 2020-01-31 NOTE — Telephone Encounter (Signed)
Transition Care Management Follow-up Telephone Call  Date of discharge and from where: 01/30/20-[place]  How have you been since you were released from the hospital? Fine  Any questions or concerns? No  Items Reviewed:  Did the pt receive and understand the discharge instructions provided? Yes   Medications obtained and verified? Yes   Other? Yes   Any new allergies since your discharge? No   Dietary orders reviewed? Yes  Do you have support at home? Yes   Home Care and Equipment/Supplies: Were home health services ordered? no If so, what is the name of the agency? n/a  Has the agency set up a time to come to the patient's home? not applicable Were any new equipment or medical supplies ordered?  No What is the name of the medical supply agency? n/a Were you able to get the supplies/equipment? not applicable Do you have any questions related to the use of the equipment or supplies? n/a  Functional Questionnaire: (I = Independent and D = Dependent) ADLs: I  Bathing/Dressing- I  Meal Prep- I  Eating- I  Maintaining continence- I  Transferring/Ambulation- I  Managing Meds- I  Follow up appointments reviewed:   PCP Hospital f/u appt confirmed? Yes  Scheduled to see Worthy Rancher on 02/06/20 @ 2:00.  Specialist Hospital f/u appt confirmed? N/A   Are transportation arrangements needed? No   If their condition worsens, is the pt aware to call PCP or go to the Emergency Dept.? Yes  Was the patient provided with contact information for the PCP's office or ED? Yes  Was to pt encouraged to call back with questions or concerns? Yes

## 2020-02-01 LAB — SURGICAL PATHOLOGY

## 2020-02-02 ENCOUNTER — Ambulatory Visit: Payer: 59 | Admitting: Podiatry

## 2020-02-06 ENCOUNTER — Encounter: Payer: Self-pay | Admitting: Family

## 2020-02-06 ENCOUNTER — Other Ambulatory Visit: Payer: Self-pay

## 2020-02-06 ENCOUNTER — Ambulatory Visit (INDEPENDENT_AMBULATORY_CARE_PROVIDER_SITE_OTHER): Payer: Managed Care, Other (non HMO) | Admitting: Family

## 2020-02-06 ENCOUNTER — Other Ambulatory Visit: Payer: Self-pay | Admitting: Family

## 2020-02-06 VITALS — BP 126/86 | HR 93 | Temp 97.5°F | Ht 68.0 in | Wt 166.0 lb

## 2020-02-06 DIAGNOSIS — K2100 Gastro-esophageal reflux disease with esophagitis, without bleeding: Secondary | ICD-10-CM | POA: Diagnosis not present

## 2020-02-06 DIAGNOSIS — I10 Essential (primary) hypertension: Secondary | ICD-10-CM | POA: Diagnosis not present

## 2020-02-06 DIAGNOSIS — E0865 Diabetes mellitus due to underlying condition with hyperglycemia: Secondary | ICD-10-CM

## 2020-02-06 DIAGNOSIS — E049 Nontoxic goiter, unspecified: Secondary | ICD-10-CM

## 2020-02-06 NOTE — Patient Instructions (Signed)
Managing Stress, Adult Feeling a certain amount of stress is normal. Stress helps our body and mind get ready to deal with the demands of life. Stress hormones can motivate you to do well at work and meet your responsibilities. However severe or long-lasting (chronic) stress can affect your mental and physical health. Chronic stress puts you at higher risk for anxiety, depression, and other health problems like digestive problems, muscle aches, heart disease, high blood pressure, and stroke. What are the causes? Common causes of stress include:  Demands from work, such as deadlines, feeling overworked, or having long hours.  Pressures at home, such as money issues, disagreements with a spouse, or parenting issues.  Pressures from major life changes, such as divorce, moving, loss of a loved one, or chronic illness. You may be at higher risk for stress-related problems if you do not get enough sleep, are in poor health, do not have emotional support, or have a mental health disorder like anxiety or depression. How to recognize stress Stress can make you:  Have trouble sleeping.  Feel sad, anxious, irritable, or overwhelmed.  Lose your appetite.  Overeat or want to eat unhealthy foods.  Want to use drugs or alcohol. Stress can also cause physical symptoms, such as:  Sore, tense muscles, especially in the shoulders and neck.  Headaches.  Trouble breathing.  A faster heart rate.  Stomach pain, nausea, or vomiting.  Diarrhea or constipation.  Trouble concentrating. Follow these instructions at home: Lifestyle  Identify the source of your stress and your reaction to it. See a therapist who can help you change your reactions.  When there are stressful events: ? Talk about it with family, friends, or co-workers. ? Try to think realistically about stressful events and not ignore them or overreact. ? Try to find the positives in a stressful situation and not focus on the  negatives. ? Cut back on responsibilities at work and home, if possible. Ask for help from friends or family members if you need it.  Find ways to cope with stress, such as: ? Meditation. ? Deep breathing. ? Yoga or tai chi. ? Progressive muscle relaxation. ? Doing art, playing music, or reading. ? Making time for fun activities. ? Spending time with family and friends.  Get support from family, friends, or spiritual resources. Eating and drinking  Eat a healthy diet. This includes: ? Eating foods that are high in fiber, such as beans, whole grains, and fresh fruits and vegetables. ? Limiting foods that are high in fat and processed sugars, such as fried and sweet foods.  Do not skip meals or overeat.  Drink enough fluid to keep your urine pale yellow. Alcohol use  Do not drink alcohol if: ? Your health care provider tells you not to drink. ? You are pregnant, may be pregnant, or are planning to become pregnant.  Drinking alcohol is a way some people try to ease their stress. This can be dangerous, so if you drink alcohol: ? Limit how much you use to:  0-1 drink a day for women.  0-2 drinks a day for men. ? Be aware of how much alcohol is in your drink. In the [place] one drink equals one 12 oz bottle of beer (355 mL), one 5 oz glass of wine (148 mL), or one 1 oz glass of hard liquor (44 mL). Activity   Include 30 minutes of exercise in your daily schedule. Exercise is a good stress reducer.  Include time in your day  for an activity that you find relaxing. Try taking a walk, going on a bike ride, reading a book, or listening to music.  Schedule your time in a way that lowers stress, and keep a consistent schedule. Prioritize what is most important to get done. General instructions  Get enough sleep. Try to go to sleep and get up at about the same time every day.  Take over-the-counter and prescription medicines only as told by your health care provider.  Do not use any  products that contain nicotine or tobacco, such as cigarettes, e-cigarettes, and chewing tobacco. If you need help quitting, ask your health care provider.  Do not use drugs or smoke to cope with stress.  Keep all follow-up visits as told by your health care provider. This is important. Where to find support  Talk with your health care provider about stress management or finding a support group.  Find a therapist to work with you on your stress management techniques. Contact a health care provider if:  Your stress symptoms get worse.  You are unable to manage your stress at home.  You are struggling to stop using drugs or alcohol. Get help right away if:  You may be a danger to yourself or others.  You have any thoughts of death or suicide. If you ever feel like you may hurt yourself or others, or have thoughts about taking your own life, get help right away. You can go to your nearest emergency department or call:  Your local emergency services (911 in the [place]  A suicide crisis helpline, such as the [facility] at 409-529-0295. This is open 24 hours a day. Summary  Feeling a certain amount of stress is normal, but severe or long-lasting (chronic) stress can affect your mental and physical health.  Chronic stress can put you at higher risk for anxiety, depression, and other health problems like digestive problems, muscle aches, heart disease, high blood pressure, and stroke.  You may be at higher risk for stress-related problems if you do not get enough sleep, are in poor health, lack emotional support, or have a mental health disorder like anxiety or depression.  Identify the source of your stress and your reaction to it. Try talking about stressful events with family, friends, or co-workers, finding a coping method, or getting support from spiritual resources.  If you need more help, talk with your health care provider about finding a support group  or a mental health therapist. This information is not intended to replace advice given to you by your health care provider. Make sure you discuss any questions you have with your health care provider. Document Revised: 09/01/2018 Document Reviewed: 09/01/2018 Elsevier Patient Education  2020 [employer] for Gastroesophageal Reflux Disease, Adult When you have gastroesophageal reflux disease (GERD), the foods you eat and your eating habits are very important. Choosing the right foods can help ease your discomfort. Think about working with a nutrition specialist (dietitian) to help you make good choices. What are tips for following this plan?  Meals  Choose healthy foods that are low in fat, such as fruits, vegetables, whole grains, low-fat dairy products, and lean meat, fish, and poultry.  Eat small meals often instead of 3 large meals a day. Eat your meals slowly, and in a place where you are relaxed. Avoid bending over or lying down until 2-3 hours after eating.  Avoid eating meals 2-3 hours before bed.  Avoid drinking a  lot of liquid with meals.  Cook foods using methods other than frying. Bake, grill, or broil food instead.  Avoid or limit: ? Chocolate. ? Peppermint or spearmint. ? Alcohol. ? Pepper. ? Black and decaffeinated coffee. ? Black and decaffeinated tea. ? Bubbly (carbonated) soft drinks. ? Caffeinated energy drinks and soft drinks.  Limit high-fat foods such as: ? Fatty meat or fried foods. ? Whole milk, cream, butter, or ice cream. ? Nuts and nut butters. ? Pastries, donuts, and sweets made with butter or shortening.  Avoid foods that cause symptoms. These foods may be different for everyone. Common foods that cause symptoms include: ? Tomatoes. ? Oranges, lemons, and limes. ? Peppers. ? Spicy food. ? Onions and garlic. ? Vinegar. Lifestyle  Maintain a healthy weight. Ask your doctor what weight is healthy for you. If you need to lose  weight, work with your doctor to do so safely.  Exercise for at least 30 minutes for 5 or more days each week, or as told by your doctor.  Wear loose-fitting clothes.  Do not smoke. If you need help quitting, ask your doctor.  Sleep with the head of your bed higher than your feet. Use a wedge under the mattress or blocks under the bed frame to raise the head of the bed. Summary  When you have gastroesophageal reflux disease (GERD), food and lifestyle choices are very important in easing your symptoms.  Eat small meals often instead of 3 large meals a day. Eat your meals slowly, and in a place where you are relaxed.  Limit high-fat foods such as fatty meat or fried foods.  Avoid bending over or lying down until 2-3 hours after eating.  Avoid peppermint and spearmint, caffeine, alcohol, and chocolate. This information is not intended to replace advice given to you by your health care provider. Make sure you discuss any questions you have with your health care provider. Document Revised: 05/27/2018 Document Reviewed: 03/11/2016 Elsevier Patient Education  [employer].

## 2020-02-06 NOTE — Progress Notes (Signed)
Established Patient Office Visit  Subjective:  Patient ID: Kelli Hudson, female    DOB: [birth year]  Age: 29 y.o. MRN: [id number]  CC:  Chief Complaint  Patient presents with  . Hospitalization Follow-up    Hospital f/u due to thyroid issues. Pt states she has been fine since leaving the hospital    HPI Kelli Hudson is a 29 year old AAF in for a hospital follow-up. She was seen and admitted with epigastric pain, nausea with vomiting, and extreme pain that radiated to her arms. She reports trying some dietary changes and having a lot of personal stress that may have contributed. She has since ended a bad relationship, still has stress. Work is also stressful. Taking Protonix daily that helps. Feels much better overal. Has an appointment to see her endocrinologist Jan 3rd. She has a new diagnosis of hyperthyroidism with thyromegaly.   Past Medical History:  Diagnosis Date  . Diabetes mellitus without complication ([place])   . Hypertension     Past Surgical History:  Procedure Laterality Date  . AMPUTATION TOE Left 03/10/2018   Procedure: AMPUTATION FIFTH TOE;  Surgeon: Trula Slade, DPM;  Location: [place];  Service: Podiatry;  Laterality: Left;  . BIOPSY  01/28/2020   Procedure: BIOPSY;  Surgeon: Otis Brace, MD;  Location: [facility];  Service: Gastroenterology;;  . ESOPHAGOGASTRODUODENOSCOPY N/A 01/28/2020   Procedure: ESOPHAGOGASTRODUODENOSCOPY (EGD);  Surgeon: Otis Brace, MD;  Location: [facility];  Service: Gastroenterology;  Laterality: N/A;  . ESOPHAGOGASTRODUODENOSCOPY (EGD) WITH PROPOFOL Left 09/08/2015   Procedure: ESOPHAGOGASTRODUODENOSCOPY (EGD) WITH PROPOFOL;  Surgeon: Arta Silence, MD;  [facility] END[facility];  Service: Endoscopy;  Laterality: Left;  . WISDOM TOOTH EXTRACTION      Family History  Problem Relation Age of Onset  . Lung cancer Mother   . Bipolar disorder Father     Social History    Socioeconomic History  . Marital status: Significant Other    Spouse name: Not on file  . Number of children: Not on file  . Years of education: Not on file  . Highest education level: Not on file  Occupational History  . Not on file  Tobacco Use  . Smoking status: Former Research scientist (life sciences)  . Smokeless tobacco: Never Used  Vaping Use  . Vaping Use: Never used  Substance and Sexual Activity  . Alcohol use: Yes    Alcohol/week: 1.0 standard drink    Types: 1 Shots of liquor per week    Comment: occasional   . Drug use: No  . Sexual activity: Yes    Birth control/protection: None    Comment: female partner  Other Topics Concern  . Not on file  Social History Narrative  . Not on file   Social Determinants of Health   Financial Resource Strain: Not on file  Food Insecurity: Not on file  Transportation Needs: Not on file  Physical Activity: Not on file  Stress: Not on file  Social Connections: Not on file  Intimate Partner Violence: Not on file    Outpatient Medications Prior to Visit  Medication Sig Dispense Refill  . ASHWAGANDHA PO Take 1 tablet by mouth daily.    [name] atenolol (TENORMIN) 25 MG tablet Take 1 tablet (25 mg total) by mouth 2 (two) times daily. 60 tablet 2  . Blood Glucose Monitoring Suppl (TRUE METRIX METER) w/Device KIT 1 Device by Does not apply route 4 (four) times daily. 1 kit 0  . glucose blood (TRUE METRIX BLOOD GLUCOSE TEST)  test strip Use as instructed 100 each 12  . Insulin Glargine (BASAGLAR KWIKPEN) 100 UNIT/ML Inject 15 Units into the skin at bedtime.     . insulin lispro (HUMALOG) 100 UNIT/ML injection Inject 0.05 mLs (5 Units total) into the skin 3 (three) times daily with meals. 10 mL 5  . Insulin Pen Needle 31G X 5 MM MISC 1 Device by Does not apply route QID. For use with insulin pens 100 each 0  . lisinopril (ZESTRIL) 5 MG tablet Take 5 mg by mouth daily.     . metoCLOPramide (REGLAN) 5 MG tablet Take 1 tablet (5 mg total) by mouth 3 (three) times  daily before meals. 90 tablet 2  . Multiple Vitamin (MULTIVITAMIN WITH MINERALS) TABS tablet Take 1 tablet by mouth daily.    . mupirocin ointment (BACTROBAN) 2 % Apply 1 application topically 2 (two) times daily. (Patient taking differently: Apply 1 application topically 2 (two) times daily as needed. Wound care) 30 g 2  . norethindrone-ethinyl estradiol (LOESTRIN) 1-20 MG-MCG tablet Take 1 tablet by mouth daily.    . ondansetron (ZOFRAN ODT) 4 MG disintegrating tablet Take 1 tablet (4 mg total) by mouth every 8 (eight) hours as needed for nausea or vomiting. 20 tablet 0  . pantoprazole (PROTONIX) 40 MG tablet Take 1 tablet (40 mg total) by mouth daily. 30 tablet 0  . sucralfate (CARAFATE) 1 GM/10ML suspension Take 10 mLs (1 g total) by mouth 4 (four) times daily -  with meals and at bedtime for 14 days. 560 mL 0  . TRUEPLUS LANCETS 28G MISC assist with checking blood sugar TID and qhs (Patient not taking: No sig reported) 100 each 3   No facility-administered medications prior to visit.    No Known Allergies  ROS Review of Systems  Constitutional: Negative.   HENT: Negative.   Respiratory: Negative.   Genitourinary: Negative.   Musculoskeletal: Negative.   Skin: Negative.   Allergic/Immunologic: Negative.   Psychiatric/Behavioral: Negative.        Increased stress  All other systems reviewed and are negative.     Objective:    Physical Exam Vitals reviewed.  Constitutional:      Appearance: Normal appearance. She is normal weight.  Eyes:     Extraocular Movements: Extraocular movements intact.     Pupils: Pupils are equal, round, and reactive to light.  Neck:     Comments: Thyromegaly present Cardiovascular:     Rate and Rhythm: Normal rate and regular rhythm.     Pulses: Normal pulses.     Heart sounds: Normal heart sounds.  Pulmonary:     Effort: Pulmonary effort is normal.     Breath sounds: Normal breath sounds.  Abdominal:     General: Abdomen is flat. Bowel  sounds are normal.     Palpations: Abdomen is soft.     Tenderness: There is no abdominal tenderness. There is no guarding or rebound.  Musculoskeletal:        General: Normal range of motion.     Cervical back: Normal range of motion.  Skin:    General: Skin is warm and dry.  Neurological:     General: No focal deficit present.     Mental Status: She is alert and oriented to person, place, and time.  Psychiatric:        Mood and Affect: Mood normal.        Behavior: Behavior normal.     BP 126/86 (BP Location: Left Arm,  Patient Position: Sitting, Cuff Size: Large)   Pulse 93   Temp (!) 97.5 F (36.4 C) (Temporal)   Ht 5' 8"  (1.727 m)   Wt 166 lb (75.3 kg)   LMP 01/24/2020   SpO2 99%   BMI 25.24 kg/m  Wt Readings from Last 3 Encounters:  02/06/20 166 lb (75.3 kg)  01/28/20 145 lb (65.8 kg)  07/05/19 153 lb (69.4 kg)     Health Maintenance Due  Topic Date Due  . Hepatitis C Screening  Never done  . PNEUMOCOCCAL POLYSACCHARIDE VACCINE AGE 104-64 HIGH RISK  Never done  . COVID-19 Vaccine (1) Never done  . FOOT EXAM  Never done  . OPHTHALMOLOGY EXAM  Never done  . PAP-Cervical Cytology Screening  Never done  . PAP SMEAR-Modifier  Never done  . INFLUENZA VACCINE  Never done    There are no preventive care reminders to display for this patient.  Lab Results  Component Value Date   TSH 0.343 (L) 01/27/2020   Lab Results  Component Value Date   WBC 9.6 01/26/2020   HGB 11.7 (L) 01/26/2020   HCT 35.2 (L) 01/26/2020   MCV 88.2 01/26/2020   PLT 362 01/26/2020   Lab Results  Component Value Date   NA 137 01/30/2020   K 3.3 (L) 01/30/2020   CO2 26 01/30/2020   GLUCOSE 188 (H) 01/30/2020   BUN 8 01/30/2020   CREATININE 0.65 01/30/2020   BILITOT 1.1 01/26/2020   ALKPHOS 50 01/26/2020   AST 19 01/26/2020   ALT 16 01/26/2020   PROT 6.4 (L) 01/26/2020   ALBUMIN 2.7 (L) 01/26/2020   CALCIUM 8.5 (L) 01/30/2020   ANIONGAP 9 01/30/2020   GFR 106.35 07/05/2019    Lab Results  Component Value Date   CHOL 165 07/05/2019   Lab Results  Component Value Date   HDL 53.10 07/05/2019   Lab Results  Component Value Date   LDLCALC 75 07/05/2019   Lab Results  Component Value Date   TRIG 186.0 (H) 07/05/2019   Lab Results  Component Value Date   CHOLHDL 3 07/05/2019   Lab Results  Component Value Date   HGBA1C 11.0 (H) 01/26/2020      Assessment & Plan:   Problem List Items Addressed This Visit    Uncontrolled diabetes mellitus ([place]) - Primary   Goiter   Relevant Orders   US Thyroid Biopsy   Essential hypertension    Other Visit Diagnoses    Gastroesophageal reflux disease with esophagitis without hemorrhage          Thyroid ultrasound ordered in preparation for her appointment with Endocrinology. Continue current meds. Exercise daily to reduce stress.   Follow-up: No follow-ups on file.    Kennyth Arnold, FNP

## 2020-02-16 ENCOUNTER — Ambulatory Visit (INDEPENDENT_AMBULATORY_CARE_PROVIDER_SITE_OTHER): Payer: 59 | Admitting: Podiatry

## 2020-02-16 ENCOUNTER — Other Ambulatory Visit: Payer: Self-pay

## 2020-02-16 DIAGNOSIS — L97501 Non-pressure chronic ulcer of other part of unspecified foot limited to breakdown of skin: Secondary | ICD-10-CM | POA: Diagnosis not present

## 2020-02-16 DIAGNOSIS — E08621 Diabetes mellitus due to underlying condition with foot ulcer: Secondary | ICD-10-CM

## 2020-02-16 DIAGNOSIS — E1149 Type 2 diabetes mellitus with other diabetic neurological complication: Secondary | ICD-10-CM

## 2020-02-20 NOTE — Progress Notes (Signed)
Subjective: 30 year old female presents the office today for follow evaluation of a wound to the left fourth toe.  States the toe is doing much better.  Denies any drainage or pus any swelling or redness.  She has been continuing to keep her bandage on the wound daily.  States that the wound is almost healed. Denies any fevers, chills, nausea, vomiting.  No calf pain, chest pain, shortness of breath.    Objective: AAO x3, NAD DP/PT pulses palpable bilaterally, CRT less than 3 seconds Sensation decreased to [facility] monofilament. Previous partial fifth ray amputation left side On the dorsal, distal aspect of the left fourth toe on the DIPJ is a hyperkeratotic lesion.  Upon debridement is only a small superficial pinpoint type opening present and is almost completely healed.  There is no drainage or pus.  There is mild edema to the toe but improving as well.  There is no erythema or warmth.  No ascending cellulitis.  There is no fluctuation or crepitation.  There is no malodor. No pain with calf compression, swelling, warmth, erythema  Assessment: Ulceration left fourth toe, uncontrolled diabetes  Plan: -All treatment options discussed with the patient including all alternatives, risks, complications.  -Debrided the hyperkeratotic lesion on the wound today without any complications utilizing the 312 with scalpel.  Continue antibiotic ointment dressing changes daily.  Continue offloading.   -Glucose control. -Patient encouraged to call the office with any questions, concerns, change in symptoms.   Vivi Barrack DPM

## 2020-02-23 ENCOUNTER — Other Ambulatory Visit: Payer: Self-pay

## 2020-02-23 ENCOUNTER — Encounter (HOSPITAL_COMMUNITY): Payer: Self-pay

## 2020-02-23 DIAGNOSIS — Z79899 Other long term (current) drug therapy: Secondary | ICD-10-CM

## 2020-02-23 DIAGNOSIS — E876 Hypokalemia: Secondary | ICD-10-CM | POA: Diagnosis present

## 2020-02-23 DIAGNOSIS — I10 Essential (primary) hypertension: Secondary | ICD-10-CM | POA: Diagnosis present

## 2020-02-23 DIAGNOSIS — E1043 Type 1 diabetes mellitus with diabetic autonomic (poly)neuropathy: Secondary | ICD-10-CM | POA: Diagnosis present

## 2020-02-23 DIAGNOSIS — K3184 Gastroparesis: Secondary | ICD-10-CM | POA: Diagnosis present

## 2020-02-23 DIAGNOSIS — Z89422 Acquired absence of other left toe(s): Secondary | ICD-10-CM

## 2020-02-23 DIAGNOSIS — K219 Gastro-esophageal reflux disease without esophagitis: Secondary | ICD-10-CM | POA: Diagnosis present

## 2020-02-23 DIAGNOSIS — Z794 Long term (current) use of insulin: Secondary | ICD-10-CM

## 2020-02-23 DIAGNOSIS — E1065 Type 1 diabetes mellitus with hyperglycemia: Principal | ICD-10-CM | POA: Diagnosis present

## 2020-02-23 DIAGNOSIS — Z20822 Contact with and (suspected) exposure to covid-19: Secondary | ICD-10-CM | POA: Diagnosis present

## 2020-02-23 DIAGNOSIS — E0590 Thyrotoxicosis, unspecified without thyrotoxic crisis or storm: Secondary | ICD-10-CM | POA: Diagnosis present

## 2020-02-23 DIAGNOSIS — L97529 Non-pressure chronic ulcer of other part of left foot with unspecified severity: Secondary | ICD-10-CM | POA: Diagnosis present

## 2020-02-23 DIAGNOSIS — Z87891 Personal history of nicotine dependence: Secondary | ICD-10-CM

## 2020-02-23 DIAGNOSIS — D75839 Thrombocytosis, unspecified: Secondary | ICD-10-CM | POA: Diagnosis present

## 2020-02-23 LAB — BASIC METABOLIC PANEL
Anion gap: 17 — ABNORMAL HIGH (ref 5–15)
BUN: 24 mg/dL — ABNORMAL HIGH (ref 6–20)
CO2: 22 mmol/L (ref 22–32)
Calcium: 9.7 mg/dL (ref 8.9–10.3)
Chloride: 100 mmol/L (ref 98–111)
Creatinine, Ser: 0.94 mg/dL (ref 0.44–1.00)
GFR, Estimated: >60 mL/min (ref >60)
Glucose, Bld: 400 mg/dL — ABNORMAL HIGH (ref 70–99)
Potassium: 3.9 mmol/L (ref 3.5–5.1)
Sodium: 139 mmol/L (ref 135–145)

## 2020-02-23 LAB — CBC
HCT: 39.7 % (ref 36.0–46.0)
Hemoglobin: 13.1 g/dL (ref 12.0–15.0)
MCH: 29.4 pg (ref 26.0–34.0)
MCHC: 33 g/dL (ref 30.0–36.0)
MCV: 89 fL (ref 80.0–100.0)
Platelets: 416 10*3/uL — ABNORMAL HIGH (ref 150–400)
RBC: 4.46 MIL/uL (ref 3.87–5.11)
RDW: 12.3 % (ref 11.5–15.5)
WBC: 8.2 10*3/uL (ref 4.0–10.5)
nRBC: 0 % (ref 0.0–0.2)

## 2020-02-23 LAB — I-STAT BETA HCG BLOOD, ED (MC, WL, AP ONLY): I-stat hCG, quantitative: <5 m[IU]/mL (ref <5)

## 2020-02-23 LAB — CBG MONITORING, ED: Glucose-Capillary: 329 mg/dL — ABNORMAL HIGH (ref 70–99)

## 2020-02-23 NOTE — ED Triage Notes (Signed)
Per ems: Pt coming from home c/o upper abdominal pain that started at 4am with N/V. Hx of gastric ulcer. CBG- 452  4 mg  zofran 100 mcg fent 250 NS   120  HR 228/130 initial and last one being 165/75   20 L AC

## 2020-02-24 ENCOUNTER — Encounter (HOSPITAL_COMMUNITY): Payer: Self-pay

## 2020-02-24 ENCOUNTER — Inpatient Hospital Stay (HOSPITAL_COMMUNITY)
Admission: EM | Admit: 2020-02-24 | Discharge: 2020-02-27 | DRG: 639 | Disposition: A | Discharge status: Home / Self Care | Payer: Self-pay | Attending: Internal Medicine | Admitting: Internal Medicine

## 2020-02-24 DIAGNOSIS — Z8639 Personal history of other endocrine, nutritional and metabolic disease: Secondary | ICD-10-CM

## 2020-02-24 DIAGNOSIS — R112 Nausea with vomiting, unspecified: Secondary | ICD-10-CM

## 2020-02-24 DIAGNOSIS — R739 Hyperglycemia, unspecified: Secondary | ICD-10-CM

## 2020-02-24 DIAGNOSIS — E1165 Type 2 diabetes mellitus with hyperglycemia: Secondary | ICD-10-CM | POA: Diagnosis present

## 2020-02-24 DIAGNOSIS — E1100 Type 2 diabetes mellitus with hyperosmolarity without nonketotic hyperglycemic-hyperosmolar coma (NKHHC): Secondary | ICD-10-CM | POA: Diagnosis present

## 2020-02-24 DIAGNOSIS — Z9114 Patient's other noncompliance with medication regimen: Secondary | ICD-10-CM

## 2020-02-24 LAB — BASIC METABOLIC PANEL
Anion gap: 13 (ref 5–15)
Anion gap: 11 (ref 5–15)
Anion gap: 9 (ref 5–15)
BUN: 21 mg/dL — ABNORMAL HIGH (ref 6–20)
BUN: 16 mg/dL (ref 6–20)
BUN: 17 mg/dL (ref 6–20)
CO2: 22 mmol/L (ref 22–32)
CO2: 25 mmol/L (ref 22–32)
CO2: 26 mmol/L (ref 22–32)
Calcium: 8.9 mg/dL (ref 8.9–10.3)
Calcium: 9.2 mg/dL (ref 8.9–10.3)
Calcium: 9.3 mg/dL (ref 8.9–10.3)
Chloride: 107 mmol/L (ref 98–111)
Chloride: 103 mmol/L (ref 98–111)
Chloride: 104 mmol/L (ref 98–111)
Creatinine, Ser: 0.77 mg/dL (ref 0.44–1.00)
Creatinine, Ser: 0.74 mg/dL (ref 0.44–1.00)
Creatinine, Ser: 0.84 mg/dL (ref 0.44–1.00)
GFR, Estimated: >60 mL/min (ref >60)
GFR, Estimated: >60 mL/min (ref >60)
GFR, Estimated: >60 mL/min (ref >60)
Glucose, Bld: 310 mg/dL — ABNORMAL HIGH (ref 70–99)
Glucose, Bld: 161 mg/dL — ABNORMAL HIGH (ref 70–99)
Glucose, Bld: 208 mg/dL — ABNORMAL HIGH (ref 70–99)
Potassium: 3.8 mmol/L (ref 3.5–5.1)
Potassium: 3.5 mmol/L (ref 3.5–5.1)
Potassium: 3.6 mmol/L (ref 3.5–5.1)
Sodium: 142 mmol/L (ref 135–145)
Sodium: 138 mmol/L (ref 135–145)
Sodium: 140 mmol/L (ref 135–145)

## 2020-02-24 LAB — CBC WITH DIFFERENTIAL/PLATELET
Abs Immature Granulocytes: 0.03 10*3/uL (ref 0.00–0.07)
Basophils Absolute: 0 10*3/uL (ref 0.0–0.1)
Basophils Relative: 0 %
Eosinophils Absolute: 0 10*3/uL (ref 0.0–0.5)
Eosinophils Relative: 0 %
HCT: 40.8 % (ref 36.0–46.0)
Hemoglobin: 13.6 g/dL (ref 12.0–15.0)
Immature Granulocytes: 0 %
Lymphocytes Relative: 16 %
Lymphs Abs: 1.6 10*3/uL (ref 0.7–4.0)
MCH: 30 pg (ref 26.0–34.0)
MCHC: 33.3 g/dL (ref 30.0–36.0)
MCV: 89.9 fL (ref 80.0–100.0)
Monocytes Absolute: 0.3 10*3/uL (ref 0.1–1.0)
Monocytes Relative: 3 %
Neutro Abs: 7.9 10*3/uL — ABNORMAL HIGH (ref 1.7–7.7)
Neutrophils Relative %: 81 %
Platelets: 456 10*3/uL — ABNORMAL HIGH (ref 150–400)
RBC: 4.54 MIL/uL (ref 3.87–5.11)
RDW: 12.6 % (ref 11.5–15.5)
WBC: 9.9 10*3/uL (ref 4.0–10.5)
nRBC: 0 % (ref 0.0–0.2)

## 2020-02-24 LAB — URINALYSIS, ROUTINE W REFLEX MICROSCOPIC
Bilirubin Urine: NEGATIVE
Glucose, UA: >=500 mg/dL — AB
Ketones, ur: 20 mg/dL — AB
Leukocytes,Ua: NEGATIVE
Nitrite: NEGATIVE
Protein, ur: 100 mg/dL — AB
Specific Gravity, Urine: 1.025 (ref 1.005–1.030)
pH: 5 (ref 5.0–8.0)

## 2020-02-24 LAB — BLOOD GAS, VENOUS
Acid-base deficit: 19.5 mmol/L — ABNORMAL HIGH (ref 0.0–2.0)
Bicarbonate: 5.9 mmol/L — ABNORMAL LOW (ref 20.0–28.0)
O2 Saturation: 96.6 %
Patient temperature: 98.6
pCO2, Ven: 10.5 mmHg — CL (ref 44.0–60.0)
pH, Ven: 7.366 (ref 7.250–7.430)
pO2, Ven: 91.5 mmHg — ABNORMAL HIGH (ref 32.0–45.0)

## 2020-02-24 LAB — COMPREHENSIVE METABOLIC PANEL
ALT: 22 U/L (ref 0–44)
AST: 23 U/L (ref 15–41)
Albumin: 3.7 g/dL (ref 3.5–5.0)
Alkaline Phosphatase: 75 U/L (ref 38–126)
Anion gap: 20 — ABNORMAL HIGH (ref 5–15)
BUN: 21 mg/dL — ABNORMAL HIGH (ref 6–20)
CO2: 18 mmol/L — ABNORMAL LOW (ref 22–32)
Calcium: 9.8 mg/dL (ref 8.9–10.3)
Chloride: 104 mmol/L (ref 98–111)
Creatinine, Ser: 1.03 mg/dL — ABNORMAL HIGH (ref 0.44–1.00)
GFR, Estimated: >60 mL/min (ref >60)
Glucose, Bld: 388 mg/dL — ABNORMAL HIGH (ref 70–99)
Potassium: 4.2 mmol/L (ref 3.5–5.1)
Sodium: 142 mmol/L (ref 135–145)
Total Bilirubin: 0.9 mg/dL (ref 0.3–1.2)
Total Protein: 8.7 g/dL — ABNORMAL HIGH (ref 6.5–8.1)

## 2020-02-24 LAB — GLUCOSE, CAPILLARY
Glucose-Capillary: 174 mg/dL — ABNORMAL HIGH (ref 70–99)
Glucose-Capillary: 206 mg/dL — ABNORMAL HIGH (ref 70–99)
Glucose-Capillary: 164 mg/dL — ABNORMAL HIGH (ref 70–99)
Glucose-Capillary: 178 mg/dL — ABNORMAL HIGH (ref 70–99)

## 2020-02-24 LAB — LIPASE, BLOOD: Lipase: 20 U/L (ref 11–51)

## 2020-02-24 LAB — OSMOLALITY: Osmolality: 329 mOsm/kg (ref 275–295)

## 2020-02-24 LAB — CBG MONITORING, ED
Glucose-Capillary: 384 mg/dL — ABNORMAL HIGH (ref 70–99)
Glucose-Capillary: 306 mg/dL — ABNORMAL HIGH (ref 70–99)
Glucose-Capillary: 329 mg/dL — ABNORMAL HIGH (ref 70–99)
Glucose-Capillary: 319 mg/dL — ABNORMAL HIGH (ref 70–99)
Glucose-Capillary: 202 mg/dL — ABNORMAL HIGH (ref 70–99)
Glucose-Capillary: 138 mg/dL — ABNORMAL HIGH (ref 70–99)
Glucose-Capillary: 143 mg/dL — ABNORMAL HIGH (ref 70–99)
Glucose-Capillary: 208 mg/dL — ABNORMAL HIGH (ref 70–99)

## 2020-02-24 LAB — HEMOGLOBIN A1C
Hgb A1c MFr Bld: 10.8 % — ABNORMAL HIGH (ref 4.8–5.6)
Mean Plasma Glucose: 263.26 mg/dL

## 2020-02-24 LAB — MRSA PCR SCREENING: MRSA by PCR: NEGATIVE

## 2020-02-24 LAB — RESP PANEL BY RT-PCR (FLU A&B, COVID) ARPGX2
Influenza A by PCR: NEGATIVE
Influenza B by PCR: NEGATIVE
SARS Coronavirus 2 by RT PCR: NEGATIVE

## 2020-02-24 MED ORDER — DEXTROSE 50 % IV SOLN: 0.0000 mL | INTRAVENOUS | Status: DC | PRN

## 2020-02-24 MED ORDER — METOCLOPRAMIDE HCL 5 MG/ML IJ SOLN
10.0000 mg | Freq: Three times a day (TID) | INTRAMUSCULAR | Status: DC
  Administered 2020-02-24 – 2020-02-27 (×9): 10 mg via INTRAVENOUS
  Filled 2020-02-24 (×9): qty 2

## 2020-02-24 MED ORDER — INSULIN REGULAR(HUMAN) IN NACL 100-0.9 UT/100ML-% IV SOLN
INTRAVENOUS | Status: DC
  Administered 2020-02-24: 13 [IU]/h via INTRAVENOUS
  Filled 2020-02-24: qty 100

## 2020-02-24 MED ORDER — KETOROLAC TROMETHAMINE 30 MG/ML IJ SOLN
30.0000 mg | Freq: Once | INTRAMUSCULAR | Status: AC
  Administered 2020-02-24: 30 mg via INTRAVENOUS
  Filled 2020-02-24: qty 1

## 2020-02-24 MED ORDER — LACTATED RINGERS IV BOLUS
20.0000 mL/kg | Freq: Once | INTRAVENOUS | Status: AC
  Administered 2020-02-24: 1460 mL via INTRAVENOUS

## 2020-02-24 MED ORDER — INSULIN ASPART 100 UNIT/ML ~~LOC~~ SOLN
5.0000 [IU] | Freq: Once | SUBCUTANEOUS | Status: AC
  Administered 2020-02-24: 5 [IU] via SUBCUTANEOUS
  Filled 2020-02-24: qty 0.05

## 2020-02-24 MED ORDER — ORAL CARE MOUTH RINSE
15.0000 mL | Freq: Two times a day (BID) | OROMUCOSAL | Status: DC
  Administered 2020-02-24 – 2020-02-26 (×5): 15 mL via OROMUCOSAL

## 2020-02-24 MED ORDER — CHLORHEXIDINE GLUCONATE CLOTH 2 % EX PADS
6.0000 | MEDICATED_PAD | Freq: Every day | CUTANEOUS | Status: DC
  Administered 2020-02-24 – 2020-02-25 (×2): 6 via TOPICAL

## 2020-02-24 MED ORDER — DEXTROSE IN LACTATED RINGERS 5 % IV SOLN: INTRAVENOUS | Status: DC

## 2020-02-24 MED ORDER — FENTANYL CITRATE (PF) 100 MCG/2ML IJ SOLN
25.0000 ug | Freq: Once | INTRAMUSCULAR | Status: AC
  Administered 2020-02-24: 25 ug via INTRAVENOUS
  Filled 2020-02-24: qty 2

## 2020-02-24 MED ORDER — PANTOPRAZOLE SODIUM 40 MG IV SOLR
40.0000 mg | Freq: Once | INTRAVENOUS | Status: AC
  Administered 2020-02-24: 40 mg via INTRAVENOUS
  Filled 2020-02-24: qty 40

## 2020-02-24 MED ORDER — METOPROLOL TARTRATE 5 MG/5ML IV SOLN: 5.0000 mg | Freq: Four times a day (QID) | INTRAVENOUS | Status: DC

## 2020-02-24 MED ORDER — ENOXAPARIN SODIUM 40 MG/0.4ML ~~LOC~~ SOLN
40.0000 mg | SUBCUTANEOUS | Status: DC
  Administered 2020-02-24 – 2020-02-26 (×3): 40 mg via SUBCUTANEOUS
  Filled 2020-02-24 (×3): qty 0.4

## 2020-02-24 MED ORDER — SODIUM CHLORIDE 0.9 % IV BOLUS
1000.0000 mL | Freq: Once | INTRAVENOUS | Status: AC
  Administered 2020-02-24: 1000 mL via INTRAVENOUS

## 2020-02-24 MED ORDER — ONDANSETRON HCL 4 MG/2ML IJ SOLN
4.0000 mg | Freq: Once | INTRAMUSCULAR | Status: AC
  Administered 2020-02-24: 4 mg via INTRAVENOUS
  Filled 2020-02-24: qty 2

## 2020-02-24 MED ORDER — FENTANYL CITRATE (PF) 100 MCG/2ML IJ SOLN
50.0000 ug | Freq: Once | INTRAMUSCULAR | Status: AC
  Administered 2020-02-24: 50 ug via INTRAVENOUS
  Filled 2020-02-24: qty 2

## 2020-02-24 MED ORDER — LACTATED RINGERS IV SOLN: INTRAVENOUS | Status: DC

## 2020-02-24 MED ORDER — METOCLOPRAMIDE HCL 5 MG/ML IJ SOLN
10.0000 mg | Freq: Once | INTRAMUSCULAR | Status: AC
  Administered 2020-02-24: 10 mg via INTRAVENOUS
  Filled 2020-02-24: qty 2

## 2020-02-24 MED ORDER — METOPROLOL TARTRATE 5 MG/5ML IV SOLN
2.5000 mg | Freq: Four times a day (QID) | INTRAVENOUS | Status: DC
  Administered 2020-02-24 – 2020-02-25 (×4): 2.5 mg via INTRAVENOUS
  Filled 2020-02-24 (×4): qty 5

## 2020-02-24 MED ORDER — HALOPERIDOL LACTATE 5 MG/ML IJ SOLN
2.0000 mg | Freq: Once | INTRAMUSCULAR | Status: AC
  Administered 2020-02-24: 2 mg via INTRAVENOUS
  Filled 2020-02-24: qty 1

## 2020-02-24 MED ORDER — PROCHLORPERAZINE EDISYLATE 10 MG/2ML IJ SOLN
10.0000 mg | Freq: Four times a day (QID) | INTRAMUSCULAR | Status: DC | PRN
  Administered 2020-02-24 – 2020-02-26 (×6): 10 mg via INTRAVENOUS
  Filled 2020-02-24 (×6): qty 2

## 2020-02-24 MED ORDER — POTASSIUM CHLORIDE 10 MEQ/100ML IV SOLN
10.0000 meq | INTRAVENOUS | Status: AC
  Administered 2020-02-24 (×2): 10 meq via INTRAVENOUS
  Filled 2020-02-24 (×2): qty 100

## 2020-02-24 MED ORDER — METOPROLOL TARTRATE 5 MG/5ML IV SOLN
5.0000 mg | Freq: Once | INTRAVENOUS | Status: AC
  Administered 2020-02-24: 5 mg via INTRAVENOUS
  Filled 2020-02-24: qty 5

## 2020-02-24 MED ORDER — METOPROLOL TARTRATE 5 MG/5ML IV SOLN
5.0000 mg | INTRAVENOUS | Status: AC | PRN
  Administered 2020-02-25 (×2): 5 mg via INTRAVENOUS
  Filled 2020-02-24 (×2): qty 5

## 2020-02-24 NOTE — ED Provider Notes (Signed)
[facility] DEPT Provider Note   CSN: [id number] Arrival date & time: 02/23/20  1907     History Chief Complaint  Patient presents with  . Abdominal Pain  . Hyperglycemia    Kelli Hudson is a 30 y.o. female.  Patient with history of T2DM, gastroparesis, ulcers presents with generalized abdominal pain, nausea and vomiting for the past 24 hours. No known fever. No SoB, cough or congestion. She reports symptoms are similar to her gastroparesis.   The history is provided by the patient. No language interpreter was used.  Abdominal Pain Associated symptoms: chest pain, nausea and vomiting   Associated symptoms: no chills, no constipation, no cough, no diarrhea, no fever and no shortness of breath   Hyperglycemia Associated symptoms: abdominal pain, chest pain, nausea and vomiting   Associated symptoms: no fever, no shortness of breath and no weakness        Past Medical History:  Diagnosis Date  . Diabetes mellitus without complication ([place])   . Hypertension     Patient Active Problem List   Diagnosis Date Noted  . Intractable nausea and vomiting   . Intractable abdominal pain 01/24/2020  . Depression with anxiety 07/05/2019  . Need for Tdap vaccination 07/05/2019  . Diabetic foot ulcer ([place]) 02/21/2018  . Skin ulcer of left foot with fat layer exposed ([place]) 09/24/2017  . Diabetic polyneuropathy associated with type 2 diabetes mellitus ([place]) 09/24/2017  . Pain in right foot 09/24/2017  . Diabetic ketoacidosis without coma associated with type 2 diabetes mellitus ([place]) 01/02/2016  . Gastroparesis   . Leukocytosis   . Hyperglycemia   . Abdominal pain   . Diabetes mellitus due to underlying condition, uncontrolled, with hyperosmolarity without coma, without long-term current use of insulin ([place])   . Increased frequency of urination 06/14/2011  . UTI (urinary tract infection) 06/14/2011  . Goiter 10/05/2009  . Uncontrolled diabetes  mellitus ([place]) 10/05/2009  . Essential hypertension 10/05/2009    Past Surgical History:  Procedure Laterality Date  . AMPUTATION TOE Left 03/10/2018   Procedure: AMPUTATION FIFTH TOE;  Surgeon: Trula Slade, DPM;  Location: [facility];  Service: Podiatry;  Laterality: Left;  . BIOPSY  01/28/2020   Procedure: BIOPSY;  Surgeon: Otis Brace, MD;  Location: [facility];  Service: Gastroenterology;;  . ESOPHAGOGASTRODUODENOSCOPY N/A 01/28/2020   Procedure: ESOPHAGOGASTRODUODENOSCOPY (EGD);  Surgeon: Otis Brace, MD;  Location: [facility];  Service: Gastroenterology;  Laterality: N/A;  . ESOPHAGOGASTRODUODENOSCOPY (EGD) WITH PROPOFOL Left 09/08/2015   Procedure: ESOPHAGOGASTRODUODENOSCOPY (EGD) WITH PROPOFOL;  Surgeon: Arta Silence, MD;  Location: [facility] ENDOSCOPY;  Service: Endoscopy;  Laterality: Left;  . WISDOM TOOTH EXTRACTION       OB History   No obstetric history on file.     Family History  Problem Relation Age of Onset  . Lung cancer Mother   . Bipolar disorder Father     Social History   Tobacco Use  . Smoking status: Former Research scientist (life sciences)  . Smokeless tobacco: Never Used  Vaping Use  . Vaping Use: Never used  Substance Use Topics  . Alcohol use: Yes    Alcohol/week: 1.0 standard drink    Types: 1 Shots of liquor per week    Comment: occasional   . Drug use: No    Home Medications Prior to Admission medications   Medication Sig Start Date End Date Taking? Authorizing Provider  ASHWAGANDHA PO Take 1 tablet by mouth daily.    [provider]  atenolol (  TENORMIN) 25 MG tablet Take 1 tablet (25 mg total) by mouth 2 (two) times daily. 01/30/20   Mariel Aloe, MD  Blood Glucose Monitoring Suppl (TRUE METRIX METER) w/Device KIT 1 Device by Does not apply route 4 (four) times daily. 07/05/19   Libby Maw, MD  glucose blood (TRUE METRIX BLOOD GLUCOSE TEST) test strip Use as instructed 07/05/19   Libby Maw, MD   Insulin Glargine [facility]) 100 UNIT/ML Inject 15 Units into the skin at bedtime.  12/24/19   [provider]  insulin lispro (HUMALOG) 100 UNIT/ML injection Inject 0.05 mLs (5 Units total) into the skin 3 (three) times daily with meals. 07/05/19   Libby Maw, MD  Insulin Pen Needle 31G X 5 MM MISC 1 Device by Does not apply route QID. For use with insulin pens 07/05/19   Libby Maw, MD  lisinopril (ZESTRIL) 5 MG tablet Take 5 mg by mouth daily.  10/17/19   [provider]  metoCLOPramide (REGLAN) 5 MG tablet Take 1 tablet (5 mg total) by mouth 3 (three) times daily before meals. 01/30/20   Mariel Aloe, MD  Multiple Vitamin (MULTIVITAMIN WITH MINERALS) TABS tablet Take 1 tablet by mouth daily.    [provider]  mupirocin ointment (BACTROBAN) 2 % Apply 1 application topically 2 (two) times daily. Patient taking differently: Apply 1 application topically 2 (two) times daily as needed. Wound care 12/23/19   Trula Slade, DPM  norethindrone-ethinyl estradiol (LOESTRIN) 1-20 MG-MCG tablet Take 1 tablet by mouth daily.    [provider]  ondansetron (ZOFRAN ODT) 4 MG disintegrating tablet Take 1 tablet (4 mg total) by mouth every 8 (eight) hours as needed for nausea or vomiting. 01/30/20   Mariel Aloe, MD  pantoprazole (PROTONIX) 40 MG tablet Take 1 tablet (40 mg total) by mouth daily. 01/30/20   Mariel Aloe, MD  sucralfate (CARAFATE) 1 GM/10ML suspension Take 10 mLs (1 g total) by mouth 4 (four) times daily -  with meals and at bedtime for 14 days. 01/30/20 02/13/20  Mariel Aloe, MD  TRUEPLUS LANCETS 28G MISC assist with checking blood sugar TID and qhs Patient not taking: No sig reported 09/10/15   Brayton Caves, PA-C  insulin aspart (NOVOLOG) 100 UNIT/ML FlexPen Inject 5 Units into the skin 3 (three) times daily with meals. 02/23/18 07/05/19  Donne Hazel, MD  promethazine (PHENERGAN) 25 MG tablet Take 1 tablet (25  mg total) by mouth every 8 (eight) hours as needed for nausea or vomiting. Patient not taking: Reported on 07/05/2019 03/10/18 10/04/19  Trula Slade, DPM    Allergies    Patient has no known allergies.  Review of Systems   Review of Systems  Constitutional: Negative for chills and fever.  HENT: Negative.   Respiratory: Negative.  Negative for cough and shortness of breath.   Cardiovascular: Positive for chest pain.  Gastrointestinal: Positive for abdominal pain, nausea and vomiting. Negative for constipation and diarrhea.  Genitourinary: Negative.   Musculoskeletal: Negative.  Negative for myalgias.  Skin: Negative.   Neurological: Negative.  Negative for syncope and weakness.    Physical Exam Updated Vital Signs BP (!) 183/122 (BP Location: Right Arm) Comment: movement   Pulse (!) 126   Temp 98 F (36.7 C) (Oral)   Resp 15   SpO2 99%   Physical Exam Vitals and nursing note reviewed.  Constitutional:      Appearance: She is well-developed and  well-nourished.  HENT:     Head: Normocephalic.  Cardiovascular:     Rate and Rhythm: Regular rhythm. Tachycardia present.     Heart sounds: No murmur heard.   Pulmonary:     Effort: Pulmonary effort is normal.     Breath sounds: Normal breath sounds. No wheezing, rhonchi or rales.  Abdominal:     General: Bowel sounds are normal. There is no distension.     Palpations: Abdomen is soft.     Tenderness: There is generalized abdominal tenderness. There is guarding. There is no rebound.  Musculoskeletal:        General: Normal range of motion.     Cervical back: Normal range of motion and neck supple.  Skin:    General: Skin is warm and dry.     Findings: No rash.  Neurological:     Mental Status: She is alert.     Cranial Nerves: No cranial nerve deficit.  Psychiatric:        Mood and Affect: Mood and affect normal.     ED Results / Procedures / Treatments   Labs (all labs ordered are listed, but only abnormal  results are displayed) Labs Reviewed  BASIC METABOLIC PANEL - Abnormal; Notable for the following components:      Result Value   Glucose, Bld 400 (*)    BUN 24 (*)    Anion gap 17 (*)    All other components within normal limits  CBC - Abnormal; Notable for the following components:   Platelets 416 (*)    All other components within normal limits  CBG MONITORING, ED - Abnormal; Notable for the following components:   Glucose-Capillary 329 (*)    All other components within normal limits  CBG MONITORING, ED - Abnormal; Notable for the following components:   Glucose-Capillary 384 (*)    All other components within normal limits  URINALYSIS, ROUTINE W REFLEX MICROSCOPIC  I-STAT BETA HCG BLOOD, ED ([facility], [facility], [facility] ONLY)  CBG MONITORING, ED    EKG None  Radiology No results found.  Procedures Procedures (including critical care time)  Medications Ordered in ED Medications  fentaNYL (SUBLIMAZE) injection 50 mcg (has no administration in time range)  ondansetron (ZOFRAN) injection 4 mg (has no administration in time range)  sodium chloride 0.9 % bolus 1,000 mL (has no administration in time range)    ED Course  I have reviewed the triage vital signs and the nursing notes.  Pertinent labs & imaging results that were available during my care of the patient were reviewed by me and considered in my medical decision making (see chart for details).  Clinical Course as of 02/24/20 2321  Fri Feb 24, 2020  2263 Spoke with Dr. Marylyn Ishihara, hospitalist.  Agrees to admit the patient. [SJ]    Clinical Course User Index [SJ] Joy, Helane Gunther, PA-C   MDM Rules/Calculators/A&P                          Patient to ED with abdominal pain, nausea and vomiting. No fever.   History of gastroparesis, poorly controlled DM with last A1c 11 (01/2020). No fever. Hyperglycemic with normal CO2, elevated anion gap. VBG, IVF's ordered, pain and nausea control. Will monitor for improvement.   VBG results do not  appear reliable, reporting CO2 10.5 with normal pH of 7.3.   The patient's symptoms have been largely uncontrolled with multiple medications including Zofran, Fentanyl, Reglan, Haldol. Will attempt  final PO challenge but feel she will likely need admission for intractable symptoms.   Patient care signed out to Georga Kaufmann, [facility], pending PO challenge and appropriate disposition.    Final Clinical Impression(s) / ED Diagnoses Final diagnoses:  None   1. Nausea and vomiting 2. History of gastroparesis.  3. Hyperglycemia without acidosis   Rx / DC Orders ED Discharge Orders    None       Charlann Lange, PA-C 02/24/20 2321    Maudie Flakes, MD 02/25/20 [phone]

## 2020-02-24 NOTE — ED Notes (Signed)
Attempted to call report, RN said she will call me back

## 2020-02-24 NOTE — ED Provider Notes (Signed)
Kelli Hudson is a 30 y.o. female, presenting to the ED primarily complaining of intractable nausea and vomiting, symptoms consistent with previous instances of gastroparesis.   HPI from [name], PA-C: "Patient with history of T2DM, gastroparesis, ulcers presents with generalized abdominal pain, nausea and vomiting for the past 24 hours. No known fever. No SoB, cough or congestion. She reports symptoms are similar to her gastroparesis.   The history is provided by the patient. No language interpreter was used.  Abdominal Pain Associated symptoms: chest pain, nausea and vomiting   Associated symptoms: no chills, no constipation, no cough, no diarrhea, no fever and no shortness of breath   Hyperglycemia Associated symptoms: abdominal pain, chest pain, nausea and vomiting   Associated symptoms: no fever, no shortness of breath and no weakness"  Past Medical History:  Diagnosis Date  . Diabetes mellitus without complication (H[facility])   . Hypertension     Physical Exam  BP (!) 157/96   Pulse (!) 132   Temp 98 F (36.7 C) (Oral)   Resp 14   SpO2 98%   Physical Exam Vitals and nursing note reviewed.  Constitutional:      General: She is not in acute distress.    Appearance: She is well-developed and well-nourished. She is not diaphoretic.  HENT:     Head: Normocephalic and atraumatic.  Eyes:     Conjunctiva/sclera: Conjunctivae normal.  Cardiovascular:     Rate and Rhythm: Normal rate and regular rhythm.  Pulmonary:     Effort: Pulmonary effort is normal.  Musculoskeletal:     Cervical back: Neck supple.  Skin:    General: Skin is warm and dry.     Coloration: Skin is not pale.  Neurological:     Mental Status: She is alert.  Psychiatric:        Mood and Affect: Mood and affect normal.        Behavior: Behavior normal.     ED Course/Procedures    Procedures   Abnormal Labs Reviewed  BASIC METABOLIC PANEL - Abnormal; Notable for the following components:       Result Value   Glucose, Bld 400 (*)    BUN 24 (*)    Anion gap 17 (*)    All other components within normal limits  CBC - Abnormal; Notable for the following components:   Platelets 416 (*)    All other components within normal limits  URINALYSIS, ROUTINE W REFLEX MICROSCOPIC - Abnormal; Notable for the following components:   Glucose, UA >=500 (*)    Hgb urine dipstick SMALL (*)    Ketones, ur 20 (*)    Protein, ur 100 (*)    Bacteria, UA RARE (*)    All other components within normal limits  BLOOD GAS, VENOUS - Abnormal; Notable for the following components:   pCO2, Ven 10.5 (*)    pO2, Ven 91.5 (*)    Bicarbonate 5.9 (*)    Acid-base deficit 19.5 (*)    All other components within normal limits  CBG MONITORING, ED - Abnormal; Notable for the following components:   Glucose-Capillary 329 (*)    All other components within normal limits  CBG MONITORING, ED - Abnormal; Notable for the following components:   Glucose-Capillary 384 (*)    All other components within normal limits  CBG MONITORING, ED - Abnormal; Notable for the following components:   Glucose-Capillary 306 (*)    All other components within normal limits  MDM   Clinical Course as of 02/24/20 0802  Caleen Essex Feb 24, 2020  7493 Spoke with Dr. Ronaldo Miyamoto, hospitalist.  Agrees to admit the patient. [SJ]    Clinical Course User Index [SJ] Anselm Pancoast, PA-C    Patient care handoff report received from [name], [place]. Plan: Patient continues to vomit despite multiple medications.  Admit for intractable vomiting.   Patient tachycardic, especially once vomiting resumed. Afebrile, denies any new symptoms, no leukocytosis. Hyperglycemia with mildly elevated anion gap.  No acidosis on VBG.     Concepcion Living 02/24/20 0803    Lorre Nick, MD 02/26/20 [phone]

## 2020-02-24 NOTE — ED Notes (Signed)
Pt ambulated to bathroom 

## 2020-02-24 NOTE — ED Notes (Signed)
Called report to [place], RN

## 2020-02-24 NOTE — H&P (Signed)
History and Physical    Kelli Hudson POE:[id number] DOB: October 21, 1990 DOA: 02/24/2020  PCP: Libby Maw, MD  Patient coming from: Home  Chief Complaint: N/V/ab pain.  HPI: Kelli Hudson is a 30 y.o. female with medical history significant of DM2, gastroparesis, HTN. Presenting with N/V/ab pain. She says her symptoms started yesterday and escalated pretty quickly. She has been unable to take her gastroparesis medications. She is not tolerating fluids, food, or medicine. She denies any other aggravating or alleviating factors. She denies any treatments. She decided that she needed to come to the ED for help.    ED Course: Was given zofran, reglan in the ED. Given fluids. She was PO challenged and failed. [facility] was called for admission.  Review of Systems:  Denies CP, dyspnea, palpitations, HA, dizziness, lightheadedness. Reports LUQ ab pain. Review of systems is otherwise negative for all not mentioned in HPI.   PMHx Past Medical History:  Diagnosis Date  . Diabetes mellitus without complication ([place])   . Hypertension     PSHx Past Surgical History:  Procedure Laterality Date  . AMPUTATION TOE Left 03/10/2018   Procedure: AMPUTATION FIFTH TOE;  Surgeon: Trula Slade, DPM;  Location: [place];  Service: Podiatry;  Laterality: Left;  . BIOPSY  01/28/2020   Procedure: BIOPSY;  Surgeon: Otis Brace, MD;  Location: [facility];  Service: Gastroenterology;;  . ESOPHAGOGASTRODUODENOSCOPY N/A 01/28/2020   Procedure: ESOPHAGOGASTRODUODENOSCOPY (EGD);  Surgeon: Otis Brace, MD;  Location: [facility];  Service: Gastroenterology;  Laterality: N/A;  . ESOPHAGOGASTRODUODENOSCOPY (EGD) WITH PROPOFOL Left 09/08/2015   Procedure: ESOPHAGOGASTRODUODENOSCOPY (EGD) WITH PROPOFOL;  Surgeon: Arta Silence, MD;  Location: [facility] ENDOSCOPY;  Service: Endoscopy;  Laterality: Left;  . WISDOM TOOTH EXTRACTION      SocHx  reports that she has quit  smoking. She has never used smokeless tobacco. She reports current alcohol use of about 1.0 standard drink of alcohol per week. She reports that she does not use drugs.  No Known Allergies  FamHx Family History  Problem Relation Age of Onset  . Lung cancer Mother   . Bipolar disorder Father     Prior to Admission medications   Medication Sig Start Date End Date Taking? Authorizing Provider  ASHWAGANDHA PO Take 1 tablet by mouth daily.   Yes [provider]  atenolol (TENORMIN) 25 MG tablet Take 1 tablet (25 mg total) by mouth 2 (two) times daily. 01/30/20  Yes Mariel Aloe, MD  Insulin Glargine (BASAGLAR KWIKPEN) 100 UNIT/ML Inject 15 Units into the skin at bedtime.  12/24/19  Yes [provider]  insulin lispro (HUMALOG) 100 UNIT/ML injection Inject 0.05 mLs (5 Units total) into the skin 3 (three) times daily with meals. 07/05/19  Yes Libby Maw, MD  lisinopril (ZESTRIL) 5 MG tablet Take 5 mg by mouth daily.  10/17/19  Yes [provider]  metoCLOPramide (REGLAN) 5 MG tablet Take 1 tablet (5 mg total) by mouth 3 (three) times daily before meals. 01/30/20  Yes Mariel Aloe, MD  Multiple Vitamin (MULTIVITAMIN WITH MINERALS) TABS tablet Take 1 tablet by mouth daily.   Yes [provider]  mupirocin ointment (BACTROBAN) 2 % Apply 1 application topically 2 (two) times daily. Patient taking differently: Apply 1 application topically 2 (two) times daily as needed. Wound care 12/23/19  Yes Trula Slade, DPM  norethindrone-ethinyl estradiol (LOESTRIN) 1-20 MG-MCG tablet Take 1 tablet by mouth daily.   Yes [provider]  ondansetron (ZOFRAN ODT) 4  MG disintegrating tablet Take 1 tablet (4 mg total) by mouth every 8 (eight) hours as needed for nausea or vomiting. 01/30/20  Yes Mariel Aloe, MD  pantoprazole (PROTONIX) 40 MG tablet Take 1 tablet (40 mg total) by mouth daily. 01/30/20  Yes Mariel Aloe, MD  Blood Glucose Monitoring  Suppl (TRUE METRIX METER) w/Device KIT 1 Device by Does not apply route 4 (four) times daily. 07/05/19   Libby Maw, MD  glucose blood (TRUE METRIX BLOOD GLUCOSE TEST) test strip Use as instructed 07/05/19   Libby Maw, MD  Insulin Pen Needle 31G X 5 MM MISC 1 Device by Does not apply route QID. For use with insulin pens 07/05/19   Libby Maw, MD  TRUEPLUS LANCETS 28G MISC assist with checking blood sugar TID and qhs Patient not taking: No sig reported 09/10/15   Brayton Caves, PA-C  insulin aspart (NOVOLOG) 100 UNIT/ML FlexPen Inject 5 Units into the skin 3 (three) times daily with meals. 02/23/18 07/05/19  Donne Hazel, MD  promethazine (PHENERGAN) 25 MG tablet Take 1 tablet (25 mg total) by mouth every 8 (eight) hours as needed for nausea or vomiting. Patient not taking: Reported on 07/05/2019 03/10/18 10/04/19  Trula Slade, DPM    Physical Exam: Vitals:   02/24/20 0358 02/24/20 0415 02/24/20 0607 02/24/20 0645  BP: (!) 165/99 115/63  (!) 157/96  Pulse: (!) 132 (!) 121 (!) 131 (!) 132  Resp: 19 (!) _0 Temp:      TempSrc:      SpO2: 99% 98% 99% 98%    General: 30 y.o. female resting in bed in NAD Eyes: PERRL, normal sclera ENMT: Nares patent w/o discharge, orophaynx clear, dentition normal, ears w/o discharge/lesions/ulcers Neck: Supple, trachea midline Cardiovascular: tachy, +S1, S2, no m/g/r, equal pulses throughout Respiratory: CTABL, no w/r/r, normal WOB GI: BS+, ND, LUQ/RUQ ab pain, no masses noted, no organomegaly noted MSK: No e/c/c Skin: No rashes, bruises, ulcerations noted Neuro: A&O x 3, no focal deficits Psyc: Appropriate interaction and affect, calm/cooperative  Labs on Admission: I have personally reviewed following labs and imaging studies  CBC: Recent Labs  Lab 02/23/20 1949  WBC 8.2  HGB 13.1  HCT 39.7  MCV 89.0  PLT 034*   Basic Metabolic Panel: Recent Labs  Lab 02/23/20 1949  NA 139  K 3.9  CL 100   CO2 22  GLUCOSE 400*  BUN 24*  CREATININE 0.94  CALCIUM 9.7   GFR: CrCl cannot be calculated (Unknown ideal weight.). Liver Function Tests: No results for input(s): AST, ALT, ALKPHOS, BILITOT, PROT, ALBUMIN in the last 168 hours. No results for input(s): LIPASE, AMYLASE in the last 168 hours. No results for input(s): AMMONIA in the last 168 hours. Coagulation Profile: No results for input(s): INR, PROTIME in the last 168 hours. Cardiac Enzymes: No results for input(s): CKTOTAL, CKMB, CKMBINDEX, TROPONINI in the last 168 hours. BNP (last 3 results) No results for input(s): PROBNP in the last 8760 hours. HbA1C: No results for input(s): HGBA1C in the last 72 hours. CBG: Recent Labs  Lab 02/23/20 1922 02/24/20 0132 02/24/20 0605  GLUCAP 329* 384* 306*   Lipid Profile: No results for input(s): CHOL, HDL, LDLCALC, TRIG, CHOLHDL, LDLDIRECT in the last 72 hours. Thyroid Function Tests: No results for input(s): TSH, T4TOTAL, FREET4, T3FREE, THYROIDAB in the last 72 hours. Anemia Panel: No results for input(s): VITAMINB12, FOLATE, FERRITIN, TIBC, IRON, RETICCTPCT in the last 72 hours.  Urine analysis:    Component Value Date/Time   COLORURINE YELLOW 02/24/2020 0221   APPEARANCEUR CLEAR 02/24/2020 0221   LABSPEC 1.025 02/24/2020 0221   PHURINE 5.0 02/24/2020 0221   GLUCOSEU >=500 (A) 02/24/2020 0221   GLUCOSEU >=1000 (A) 07/05/2019 1343   HGBUR SMALL (A) 02/24/2020 0221   BILIRUBINUR NEGATIVE 02/24/2020 0221   KETONESUR 20 (A) 02/24/2020 0221   PROTEINUR 100 (A) 02/24/2020 0221   UROBILINOGEN 0.2 07/05/2019 1343   NITRITE NEGATIVE 02/24/2020 0221   LEUKOCYTESUR NEGATIVE 02/24/2020 0221    Radiological Exams on Admission: No results found.  Assessment/Plan Early HHS N/V/Ab pain DM2 w/ gastroparesis     - admit to inpt, SDU     - ENDO/insulin gtt, fluids, follow BMP     - gap is elevated, she has glucosuria and ketonuria     - IV reglan, compazine     - check  lipase  HTN     - normally on atenolol, lisinopril     - unable to tolerate PO right now, so schedule metoprolol 10m q6h for now  GERD     - protonix   DVT prophylaxis: lovenox  Code Status: FULL  Family Communication: None at bedside  Consults called: None  Status is: Inpatient  Remains inpatient appropriate because:Inpatient level of care appropriate due to severity of illness   Dispo: The patient is from: Home              Anticipated d/c is to: Home              Anticipated d/c date is: 3 days              Patient currently is not medically stable to d/c.  TJonnie FinnerDO [facility]  If 7PM-7AM, please contact night-coverage [email or web address]  02/24/2020, 8:11 AM

## 2020-02-24 NOTE — ED Notes (Signed)
Date and time results received: 02/24/20 0313   Test: VBG  Critical Value: CO2 10.5   Name of Provider Notified: Pilar Plate, MD   Orders Received? Or Actions Taken?: Awaiting Orders

## 2020-02-24 NOTE — ED Notes (Signed)
Pt still actively vomiting

## 2020-02-25 ENCOUNTER — Inpatient Hospital Stay (HOSPITAL_COMMUNITY): Payer: Self-pay

## 2020-02-25 ENCOUNTER — Other Ambulatory Visit: Payer: Self-pay

## 2020-02-25 DIAGNOSIS — E10621 Type 1 diabetes mellitus with foot ulcer: Secondary | ICD-10-CM

## 2020-02-25 DIAGNOSIS — K3184 Gastroparesis: Secondary | ICD-10-CM

## 2020-02-25 DIAGNOSIS — E0590 Thyrotoxicosis, unspecified without thyrotoxic crisis or storm: Secondary | ICD-10-CM

## 2020-02-25 DIAGNOSIS — E1021 Type 1 diabetes mellitus with diabetic nephropathy: Secondary | ICD-10-CM

## 2020-02-25 DIAGNOSIS — L97509 Non-pressure chronic ulcer of other part of unspecified foot with unspecified severity: Secondary | ICD-10-CM

## 2020-02-25 DIAGNOSIS — R112 Nausea with vomiting, unspecified: Secondary | ICD-10-CM

## 2020-02-25 DIAGNOSIS — I152 Hypertension secondary to endocrine disorders: Secondary | ICD-10-CM

## 2020-02-25 DIAGNOSIS — E1143 Type 2 diabetes mellitus with diabetic autonomic (poly)neuropathy: Secondary | ICD-10-CM

## 2020-02-25 DIAGNOSIS — E1159 Type 2 diabetes mellitus with other circulatory complications: Secondary | ICD-10-CM

## 2020-02-25 LAB — GLUCOSE, CAPILLARY
Glucose-Capillary: 140 mg/dL — ABNORMAL HIGH (ref 70–99)
Glucose-Capillary: 154 mg/dL — ABNORMAL HIGH (ref 70–99)
Glucose-Capillary: 169 mg/dL — ABNORMAL HIGH (ref 70–99)
Glucose-Capillary: 171 mg/dL — ABNORMAL HIGH (ref 70–99)
Glucose-Capillary: 177 mg/dL — ABNORMAL HIGH (ref 70–99)
Glucose-Capillary: 187 mg/dL — ABNORMAL HIGH (ref 70–99)
Glucose-Capillary: 188 mg/dL — ABNORMAL HIGH (ref 70–99)
Glucose-Capillary: 224 mg/dL — ABNORMAL HIGH (ref 70–99)
Glucose-Capillary: 84 mg/dL (ref 70–99)

## 2020-02-25 LAB — BASIC METABOLIC PANEL
Anion gap: 10 (ref 5–15)
Anion gap: 7 (ref 5–15)
BUN: 13 mg/dL (ref 6–20)
BUN: 14 mg/dL (ref 6–20)
CO2: 26 mmol/L (ref 22–32)
CO2: 26 mmol/L (ref 22–32)
Calcium: 9.3 mg/dL (ref 8.9–10.3)
Calcium: 8.6 mg/dL — ABNORMAL LOW (ref 8.9–10.3)
Chloride: 102 mmol/L (ref 98–111)
Chloride: 103 mmol/L (ref 98–111)
Creatinine, Ser: 0.66 mg/dL (ref 0.44–1.00)
Creatinine, Ser: 0.84 mg/dL (ref 0.44–1.00)
GFR, Estimated: >60 mL/min (ref >60)
GFR, Estimated: >60 mL/min (ref >60)
Glucose, Bld: 190 mg/dL — ABNORMAL HIGH (ref 70–99)
Glucose, Bld: 180 mg/dL — ABNORMAL HIGH (ref 70–99)
Potassium: 3.2 mmol/L — ABNORMAL LOW (ref 3.5–5.1)
Potassium: 3.4 mmol/L — ABNORMAL LOW (ref 3.5–5.1)
Sodium: 138 mmol/L (ref 135–145)
Sodium: 136 mmol/L (ref 135–145)

## 2020-02-25 MED ORDER — SODIUM CHLORIDE 0.9 % IV BOLUS
1000.0000 mL | Freq: Once | INTRAVENOUS | Status: AC
  Administered 2020-02-25: 1000 mL via INTRAVENOUS

## 2020-02-25 MED ORDER — SUCRALFATE 1 GM/10ML PO SUSP
1.0000 g | Freq: Three times a day (TID) | ORAL | Status: DC
  Administered 2020-02-25 – 2020-02-27 (×8): 1 g via ORAL
  Filled 2020-02-25 (×8): qty 10

## 2020-02-25 MED ORDER — MORPHINE SULFATE (PF) 2 MG/ML IV SOLN
INTRAVENOUS | Status: AC
  Filled 2020-02-25: qty 1

## 2020-02-25 MED ORDER — PANTOPRAZOLE SODIUM 40 MG IV SOLR
40.0000 mg | Freq: Two times a day (BID) | INTRAVENOUS | Status: DC
  Administered 2020-02-25 – 2020-02-27 (×5): 40 mg via INTRAVENOUS
  Filled 2020-02-25 (×5): qty 40

## 2020-02-25 MED ORDER — METOPROLOL TARTRATE 5 MG/5ML IV SOLN
2.5000 mg | Freq: Four times a day (QID) | INTRAVENOUS | Status: DC
  Administered 2020-02-26 – 2020-02-27 (×5): 2.5 mg via INTRAVENOUS
  Filled 2020-02-25 (×5): qty 5

## 2020-02-25 MED ORDER — SODIUM CHLORIDE 0.9 % IV SOLN
INTRAVENOUS | Status: DC | PRN
  Administered 2020-02-25 – 2020-02-26 (×2): 250 mL via INTRAVENOUS

## 2020-02-25 MED ORDER — SODIUM CHLORIDE 0.9 % IV SOLN: INTRAVENOUS | Status: AC

## 2020-02-25 MED ORDER — HYDROMORPHONE HCL 1 MG/ML IJ SOLN
0.5000 mg | Freq: Once | INTRAMUSCULAR | Status: AC
  Administered 2020-02-25: 0.5 mg via INTRAVENOUS
  Filled 2020-02-25: qty 1

## 2020-02-25 MED ORDER — INSULIN GLARGINE 100 UNIT/ML ~~LOC~~ SOLN
12.0000 [IU] | Freq: Every day | SUBCUTANEOUS | Status: DC
  Administered 2020-02-25 – 2020-02-26 (×3): 12 [IU] via SUBCUTANEOUS
  Filled 2020-02-25 (×3): qty 0.12

## 2020-02-25 MED ORDER — METOPROLOL TARTRATE 5 MG/5ML IV SOLN
5.0000 mg | Freq: Four times a day (QID) | INTRAVENOUS | Status: DC
  Administered 2020-02-25: 5 mg via INTRAVENOUS
  Filled 2020-02-25: qty 5

## 2020-02-25 MED ORDER — HYDROMORPHONE HCL 1 MG/ML IJ SOLN
0.5000 mg | INTRAMUSCULAR | Status: DC | PRN
  Administered 2020-02-25 – 2020-02-26 (×7): 0.5 mg via INTRAVENOUS
  Filled 2020-02-25 (×9): qty 1

## 2020-02-25 MED ORDER — MORPHINE SULFATE (PF) 2 MG/ML IV SOLN
2.0000 mg | INTRAVENOUS | Status: DC | PRN
  Administered 2020-02-25: 2 mg via INTRAVENOUS

## 2020-02-25 MED ORDER — INSULIN ASPART 100 UNIT/ML ~~LOC~~ SOLN
0.0000 [IU] | SUBCUTANEOUS | Status: DC
  Administered 2020-02-25: 3 [IU] via SUBCUTANEOUS
  Administered 2020-02-25: 5 [IU] via SUBCUTANEOUS
  Administered 2020-02-25: 2 [IU] via SUBCUTANEOUS
  Administered 2020-02-25: 3 [IU] via SUBCUTANEOUS
  Administered 2020-02-26: 2 [IU] via SUBCUTANEOUS
  Administered 2020-02-26: 3 [IU] via SUBCUTANEOUS

## 2020-02-25 MED ORDER — POTASSIUM CHLORIDE 10 MEQ/100ML IV SOLN
10.0000 meq | INTRAVENOUS | Status: AC
  Administered 2020-02-25 (×4): 10 meq via INTRAVENOUS
  Filled 2020-02-25 (×4): qty 100

## 2020-02-25 NOTE — Accreditation Note (Signed)
Patient hypotensive BP 76/34 HR 105. Patient denies any dizziness or chest pain. Dr. Roda Shutters notified new orders received

## 2020-02-25 NOTE — Progress Notes (Addendum)
PROGRESS NOTE    Kelli Hudson  VQQ:[id number] DOB: 27-Mar-1990 DOA: 02/24/2020 PCP: Mliss Sax, MD    Chief Complaint  Patient presents with  . Abdominal Pain  . Hyperglycemia    Brief Narrative:  History of type 1 diabetes uncontrolled, recent diagnosed with hyperthyroidism, presented to the hospital with nausea vomiting abdominal pain and hyperglycemia  Subjective:  Complaint abdominal pain 12 out of 10, morphine does not work, she wants Dilaudid Reports feeling nauseous wants Phenergan She has not eat anything Elevated blood pressure, sinus tachycardia   Assessment & Plan:   Active Problems:   Hyperosmolar hyperglycemic state (HHS) (HCC)  Gastroparesis exacerbation -Presented with N/V/ab pain -Continue IV Reglan, add IV Protonix, oral Carafate -We will get KUB, gastric emptying study -Case discussed with [facility] Dr. Levora Angel who agree with above plan, if patient does not improve with above regimen may need formal GI consult -She was seen by [facility] on 12/23/2019 -Clear liquid as tolerated at this moment -As needed analgesic, antiemetics, start normal saline for 24 hours  Hypokalemia, replace K, check mag  Thrombocytosis, likely reactive, monitor   type 1 diabetes, uncontrolled with hyperglycemia Complicated by gastroparesis ,retinopathy, neuropathy with recent history of diabetic foot ulcer to left fourth toe Continue insulin, adjust as needed She is followed by endocrinology Dr Karen Kitchens, has an appointment on 1/18   diabetic foot ulcer, left fourth toe Podiatry Dr Ardelle Anton Debrided the hyperkeratotic lesion on the wound on 12/30  Continue antibiotic ointment dressing changes daily.   Continue offloading.   Hypertension On atenolol and lisinopril at home Currently not able to take oral meds , BP remains elevated , increase IV Lopressor  Hyperthyroidism probably related to Graves. thyroid ultrasound on 10/07/2019, showing a  mildly enlarged thyroid gland with a right lobe measuring 5.6 cm and the left lobe measuring 6.3 cm in greatest dimension, with a 1.2 cm spongiform right thyroid nodule and another 1.5 cm right lower spongiform nodule that was hyperechoic. No suspicious features or other abnormalities were seen. On methimazole 2.5mg  BID,  atenolol 25mg  BID at home, currently n.p.o., on IV Lopressor, need to go back on home regimen once able to tolerate oral intake She is followed by endocrinology Dr , has an appointment on 1/18  Recent h/o gastritis Shown on EGD in 09/2019 and 01/2020 Continue ppi  H/o pylori infection in 09/2019, treated    DVT prophylaxis: enoxaparin (LOVENOX) injection 40 mg Start: 02/24/20 1200   Code Status: Full Family Communication: Mother over the phone with permission Disposition:   Status is: Inpatient  Dispo: The patient is from: Home              Anticipated d/c is to: Home              Anticipated d/c date is: To be determined.  Need to be able to tolerate diet                Consultants:   Case discussed with GI Dr. 04/23/20  Procedures:   Gastric emptying study  Antimicrobials:   None     Objective: Vitals:   02/25/20 0500 02/25/20 0600 02/25/20 0831 02/25/20 0915  BP: (!) 127/103 (!) 148/106 (!) 176/106   Pulse: (!) 103 (!) 102 (!) 108 (!) 103  Resp: (!) 9 11 (!) 1 14  Temp:   98.3 F (36.8 C)   TempSrc:   Oral   SpO2: 98% 98% 98% 98%  Weight:  Intake/Output Summary (Last 24 hours) at 02/25/2020 1112 Last data filed at 02/25/2020 0600 Gross per 24 hour  Intake 1897.28 ml  Output --  Net 1897.28 ml   Filed Weights   02/24/20 1114  Weight: 73 kg    Examination:  General exam: Does not appear comfortable Respiratory system: Clear to auscultation. Respiratory effort normal. Cardiovascular system: Tachycardia, no pedal edema. Gastrointestinal system: Abdomen is tender ,nondistended, . Normal bowel sounds heard. Central  nervous system: Alert and oriented. No focal neurological deficits. Extremities: Symmetric 5 x 5 power. Skin: No rashes, lesions or ulcers Psychiatry: Flat affect.     Data Reviewed: I have personally reviewed following labs and imaging studies  CBC: Recent Labs  Lab 02/23/20 1949 02/24/20 0929  WBC 8.2 9.9  NEUTROABS  --  7.9*  HGB 13.1 13.6  HCT 39.7 40.8  MCV 89.0 89.9  PLT 416* 456*    Basic Metabolic Panel: Recent Labs  Lab 02/24/20 1139 02/24/20 1833 02/24/20 1922 02/24/20 2350 02/25/20 0235  NA 142 138 140 138 136  K 3.8 3.6 3.5 3.2* 3.4*  CL 107 103 104 102 103  CO2 22 26 25 26 26   GLUCOSE 310* 208* 161* 190* 180*  BUN 21* 17 16 13 14   CREATININE 0.77 0.84 0.74 0.66 0.84  CALCIUM 8.9 9.3 9.2 9.3 8.6*    GFR: Estimated Creatinine Clearance: 100.6 mL/min (by C-G formula based on SCr of 0.84 mg/dL).  Liver Function Tests: Recent Labs  Lab 02/24/20 0929  AST 23  ALT 22  ALKPHOS 75  BILITOT 0.9  PROT 8.7*  ALBUMIN 3.7    CBG: Recent Labs  Lab 02/25/20 0024 02/25/20 0134 02/25/20 0234 02/25/20 0354 02/25/20 0820  GLUCAP 187* 177* 171* 154* 169*     Recent Results (from the past 240 hour(s))  Resp Panel by RT-PCR (Flu A&B, Covid) Nasopharyngeal Swab     Status: None   Collection Time: 02/24/20  7:24 AM   Specimen: Nasopharyngeal Swab; Nasopharyngeal(NP) swabs in vial transport medium  Result Value Ref Range Status   SARS Coronavirus 2 by RT PCR NEGATIVE NEGATIVE Final    Comment: (NOTE) SARS-CoV-2 target nucleic acids are NOT DETECTED.  The SARS-CoV-2 RNA is generally detectable in upper respiratory specimens during the acute phase of infection. The lowest concentration of SARS-CoV-2 viral copies this assay can detect is 138 copies/mL. A negative result does not preclude SARS-Cov-2 infection and should not be used as the sole basis for treatment or other patient management decisions. A negative result may occur with  improper specimen  collection/handling, submission of specimen other than nasopharyngeal swab, presence of viral mutation(s) within the areas targeted by this assay, and inadequate number of viral copies(<138 copies/mL). A negative result must be combined with clinical observations, patient history, and epidemiological information. The expected result is Negative.  Fact Sheet for Patients:  04/24/20  Fact Sheet for Healthcare Providers:  04/23/20  This test is no t yet approved or cleared by the [employer] FDA and  has been authorized for detection and/or diagnosis of SARS-CoV-2 by FDA under an Emergency Use Authorization (EUA). This EUA will remain  in effect (meaning this test can be used) for the duration of the COVID-19 declaration under Section 564(b)(1) of the Act, 21 U.S.C.section 360bbb-3(b)(1), unless the authorization is terminated  or revoked sooner.       Influenza A by PCR NEGATIVE NEGATIVE Final   Influenza B by PCR NEGATIVE NEGATIVE Final    Comment: (NOTE)  The Xpert Xpress SARS-CoV-2/FLU/RSV plus assay is intended as an aid in the diagnosis of influenza from Nasopharyngeal swab specimens and should not be used as a sole basis for treatment. Nasal washings and aspirates are unacceptable for Xpert Xpress SARS-CoV-2/FLU/RSV testing.  Fact Sheet for Patients: BloggerCourse.com  Fact Sheet for Healthcare Providers: SeriousBroker.it  This test is not yet approved or cleared by the [place] FDA and has been authorized for detection and/or diagnosis of SARS-CoV-2 by FDA under an Emergency Use Authorization (EUA). This EUA will remain in effect (meaning this test can be used) for the duration of the COVID-19 declaration under Section 564(b)(1) of the Act, 21 U.S.C. section 360bbb-3(b)(1), unless the authorization is terminated or revoked.  Performed at [facility], [address] [place], [place] [address]   MRSA PCR Screening     Status: None   Collection Time: 02/24/20  7:04 PM   Specimen: Nasopharyngeal  Result Value Ref Range Status   MRSA by PCR NEGATIVE NEGATIVE Final    Comment:        The GeneXpert MRSA Assay (FDA approved for NASAL specimens only), is one component of a comprehensive MRSA colonization surveillance program. It is not intended to diagnose MRSA infection nor to guide or monitor treatment for MRSA infections. Performed at [facility], [address], [place], [place] [address]          Radiology Studies: No results found.      Scheduled Meds: . Chlorhexidine Gluconate Cloth  6 each Topical Daily  . enoxaparin (LOVENOX) injection  40 mg Subcutaneous Q24H  . insulin aspart  0-15 Units Subcutaneous Q4H  . insulin glargine  12 Units Subcutaneous Daily  . mouth rinse  15 mL Mouth Rinse BID  . metoCLOPramide (REGLAN) injection  10 mg Intravenous Q8H  . metoprolol tartrate  2.5 mg Intravenous Q6H  . pantoprazole (PROTONIX) IV  40 mg Intravenous Q12H  . sucralfate  1 g Oral TID WC & HS   Continuous Infusions: . sodium chloride 10 mL/hr at 02/25/20 0600     LOS: 1 day   Time spent: Greater than 50% of this time was spent in counseling, explanation of diagnosis, planning of further management, and coordination of care.  I have personally reviewed and interpreted on  02/25/2020 daily labs, tele strips,I reviewed all nursing notes, pharmacy notes,  vitals, pertinent old records  I have discussed plan of care as described above with RN , patient and family on 02/25/2020  Voice Recognition /Dragon dictation system was used to create this note, attempts have been made to correct errors. Please contact the author with questions and/or clarifications.   Albertine Grates, MD PhD FACP [facility]  Available via Epic secure chat 7am-7pm for nonurgent issues Please  page for urgent issues To page the attending provider between 7A-7P or the covering provider during after hours 7P-7A, please log into the web site [email or web address] and access using universal [place] password for that web site. If you do not have the password, please call the hospital operator.    02/25/2020, 11:12 AM

## 2020-02-26 LAB — BASIC METABOLIC PANEL
Anion gap: 8 (ref 5–15)
BUN: 16 mg/dL (ref 6–20)
CO2: 25 mmol/L (ref 22–32)
Calcium: 8.5 mg/dL — ABNORMAL LOW (ref 8.9–10.3)
Chloride: 106 mmol/L (ref 98–111)
Creatinine, Ser: 0.72 mg/dL (ref 0.44–1.00)
GFR, Estimated: >60 mL/min (ref >60)
Glucose, Bld: 108 mg/dL — ABNORMAL HIGH (ref 70–99)
Potassium: 3.6 mmol/L (ref 3.5–5.1)
Sodium: 139 mmol/L (ref 135–145)

## 2020-02-26 LAB — MAGNESIUM: Magnesium: 1.4 mg/dL — ABNORMAL LOW (ref 1.7–2.4)

## 2020-02-26 LAB — CBC WITH DIFFERENTIAL/PLATELET
Abs Immature Granulocytes: 0.03 10*3/uL (ref 0.00–0.07)
Basophils Absolute: 0.1 10*3/uL (ref 0.0–0.1)
Basophils Relative: 0 %
Eosinophils Absolute: 0.1 10*3/uL (ref 0.0–0.5)
Eosinophils Relative: 1 %
HCT: 33.1 % — ABNORMAL LOW (ref 36.0–46.0)
Hemoglobin: 11 g/dL — ABNORMAL LOW (ref 12.0–15.0)
Immature Granulocytes: 0 %
Lymphocytes Relative: 38 %
Lymphs Abs: 4.3 10*3/uL — ABNORMAL HIGH (ref 0.7–4.0)
MCH: 30.1 pg (ref 26.0–34.0)
MCHC: 33.2 g/dL (ref 30.0–36.0)
MCV: 90.7 fL (ref 80.0–100.0)
Monocytes Absolute: 0.7 10*3/uL (ref 0.1–1.0)
Monocytes Relative: 7 %
Neutro Abs: 6.1 10*3/uL (ref 1.7–7.7)
Neutrophils Relative %: 54 %
Platelets: 328 10*3/uL (ref 150–400)
RBC: 3.65 MIL/uL — ABNORMAL LOW (ref 3.87–5.11)
RDW: 12.4 % (ref 11.5–15.5)
WBC: 11.3 10*3/uL — ABNORMAL HIGH (ref 4.0–10.5)
nRBC: 0 % (ref 0.0–0.2)

## 2020-02-26 LAB — GLUCOSE, CAPILLARY
Glucose-Capillary: 102 mg/dL — ABNORMAL HIGH (ref 70–99)
Glucose-Capillary: 114 mg/dL — ABNORMAL HIGH (ref 70–99)
Glucose-Capillary: 172 mg/dL — ABNORMAL HIGH (ref 70–99)
Glucose-Capillary: 128 mg/dL — ABNORMAL HIGH (ref 70–99)
Glucose-Capillary: 94 mg/dL (ref 70–99)
Glucose-Capillary: 85 mg/dL (ref 70–99)

## 2020-02-26 LAB — TSH: TSH: 0.882 u[IU]/mL (ref 0.350–4.500)

## 2020-02-26 MED ORDER — SODIUM CHLORIDE 0.9 % IV SOLN: INTRAVENOUS | Status: DC

## 2020-02-26 MED ORDER — POTASSIUM CHLORIDE 10 MEQ/100ML IV SOLN
10.0000 meq | INTRAVENOUS | Status: AC
  Administered 2020-02-26 (×2): 10 meq via INTRAVENOUS
  Filled 2020-02-26 (×2): qty 100

## 2020-02-26 MED ORDER — MAGNESIUM SULFATE 4 GM/100ML IV SOLN
4.0000 g | Freq: Once | INTRAVENOUS | Status: AC
  Administered 2020-02-26: 4 g via INTRAVENOUS
  Filled 2020-02-26: qty 100

## 2020-02-26 MED ORDER — POTASSIUM CHLORIDE 10 MEQ/100ML IV SOLN
INTRAVENOUS | Status: AC
  Administered 2020-02-26: 10 meq via INTRAVENOUS
  Filled 2020-02-26: qty 100

## 2020-02-26 NOTE — Progress Notes (Addendum)
PROGRESS NOTE    Kelli Hudson  KGM:[id number] DOB: 1990/06/23 DOA: 02/24/2020 PCP: Mliss Sax, MD    Chief Complaint  Patient presents with  . Abdominal Pain  . Hyperglycemia    Brief Narrative:  History of type 1 diabetes uncontrolled, recent diagnosed with hyperthyroidism, presented to the hospital with nausea vomiting abdominal pain and hyperglycemia  Subjective:  Complaint abdominal pain, Vomited with full liquid, report had regular bowel movement this morning Elevated blood pressure, sinus tachycardia Mother at bedside with permission   Assessment & Plan:   Active Problems:   Hyperosmolar hyperglycemic state (HHS) (HCC)  Gastroparesis exacerbation -Presented with N/V/ab pain -Continue IV Reglan, add IV Protonix, oral Carafate - KUB personally reviewed unremarkable, gastric emptying study ordered, will keep n.p.o. after midnight, hopefully study can be done on Monday -Case discussed with [facility] Dr. Levora Angel who agree with above plan, if patient does not improve with above regimen may need formal GI consult -She was seen by [facility] on 12/23/2019, was given gastroparesis diet instruction(detailed under care everywhere) -Clear liquid as tolerated at this moment -As needed analgesic, antiemetics, continue normal saline for 24 hours  Hypokalemia/hypomagnesemia, replace K and mag, repeat in the morning  Thrombocytosis, likely reactive, normalized   type 1 diabetes, uncontrolled with hyperglycemia, blood glucose on presentation 400,  A1c 10.8 Complicated by gastroparesis ,retinopathy, neuropathy with recent history of diabetic foot ulcer to left fourth toe Continue insulin, adjust as needed She is followed by endocrinology Dr Karen Kitchens, has an appointment on 1/18, mother requests referral to be made by the hospital to an endocrinology in [facility] health system.   diabetic foot ulcer, left fourth toe Podiatry Dr Ardelle Anton Debrided the  hyperkeratotic lesion on the wound on 12/30  Continue antibiotic ointment dressing changes daily.   Continue offloading.   Hypertension On atenolol and lisinopril at home Currently not able to take oral meds , BP remains elevated , on IV Lopressor, continue adjust  Hyperthyroidism probably related to Graves, per endocrinologist notes -thyroid ultrasound on 10/07/2019, showing a mildly enlarged thyroid gland with a right lobe measuring 5.6 cm and the left lobe measuring 6.3 cm in greatest dimension, with a 1.2 cm spongiform right thyroid nodule and another 1.5 cm right lower spongiform nodule that was hyperechoic. No suspicious features or other abnormalities were seen. -On methimazole 2.5mg  BID,  atenolol 25mg  BID at home, currently n.p.o., on IV Lopressor, currently not able to take oral consistently,  need to go back on home regimen once able to tolerate oral intake She is followed by endocrinology Dr , has an appointment on 1/18, howevermother requests referral to be made by the hospital to an endocrinology in [facility] health system.   Recent h/o gastritis Shown on EGD in 09/2019 and 01/2020 Continue ppi, Carafate  H/o pylori infection in 09/2019, treated, per care everywhere    DVT prophylaxis: enoxaparin (LOVENOX) injection 40 mg Start: 02/24/20 1200   Code Status: Full Family Communication: Mother at bedside with permission Disposition:   Status is: Inpatient  Dispo: The patient is from: Home              Anticipated d/c is to: Home              Anticipated d/c date is: To be determined.  Need to be able to tolerate diet                Consultants:   Case discussed with GI Dr. 04/23/20  Procedures:  Gastric emptying study  Antimicrobials:   None     Objective: Vitals:   02/26/20 0500 02/26/20 0600 02/26/20 0908 02/26/20 1251  BP: (!) 145/96 127/90    Pulse: (!) 107 100    Resp:      Temp:   98.1 F (36.7 C) 98.3 F (36.8 C)  TempSrc:   Oral  Oral  SpO2: 98% 97%    Weight:        Intake/Output Summary (Last 24 hours) at 02/26/2020 1307 Last data filed at 02/25/2020 1858 Gross per 24 hour  Intake 67.94 ml  Output 700 ml  Net -632.06 ml   Filed Weights   02/24/20 1114  Weight: 73 kg    Examination:  General exam: Does not appear comfortable Respiratory system: Clear to auscultation. Respiratory effort normal. Cardiovascular system: Tachycardia, no pedal edema. Gastrointestinal system: Epigastric tenderness with light touch, no pressure applied ,nondistended, . Normal bowel sounds heard. Central nervous system: Alert and oriented. No focal neurological deficits. Extremities: Symmetric 5 x 5 power. Skin: No rashes, lesions or ulcers Psychiatry: Flat affect.     Data Reviewed: I have personally reviewed following labs and imaging studies  CBC: Recent Labs  Lab 02/23/20 1949 02/24/20 0929 02/26/20 0228  WBC 8.2 9.9 11.3*  NEUTROABS  --  7.9* 6.1  HGB 13.1 13.6 11.0*  HCT 39.7 40.8 33.1*  MCV 89.0 89.9 90.7  PLT 416* 456* 328    Basic Metabolic Panel: Recent Labs  Lab 02/24/20 1833 02/24/20 1922 02/24/20 2350 02/25/20 0235 02/26/20 0228  NA 138 140 138 136 139  K 3.6 3.5 3.2* 3.4* 3.6  CL 103 104 102 103 106  CO2 26 25 26 26 25   GLUCOSE 208* 161* 190* 180* 108*  BUN 17 16 13 14 16   CREATININE 0.84 0.74 0.66 0.84 0.72  CALCIUM 9.3 9.2 9.3 8.6* 8.5*  MG  --   --   --   --  1.4*    GFR: Estimated Creatinine Clearance: 105.6 mL/min (by C-G formula based on SCr of 0.72 mg/dL).  Liver Function Tests: Recent Labs  Lab 02/24/20 0929  AST 23  ALT 22  ALKPHOS 75  BILITOT 0.9  PROT 8.7*  ALBUMIN 3.7    CBG: Recent Labs  Lab 02/25/20 1555 02/25/20 1949 02/25/20 2345 02/26/20 0351 02/26/20 0728  GLUCAP 224* 140* 84 102* 114*     Recent Results (from the past 240 hour(s))  Resp Panel by RT-PCR (Flu A&B, Covid) Nasopharyngeal Swab     Status: None   Collection Time: 02/24/20  7:24 AM    Specimen: Nasopharyngeal Swab; Nasopharyngeal(NP) swabs in vial transport medium  Result Value Ref Range Status   SARS Coronavirus 2 by RT PCR NEGATIVE NEGATIVE Final    Comment: (NOTE) SARS-CoV-2 target nucleic acids are NOT DETECTED.  The SARS-CoV-2 RNA is generally detectable in upper respiratory specimens during the acute phase of infection. The lowest concentration of SARS-CoV-2 viral copies this assay can detect is 138 copies/mL. A negative result does not preclude SARS-Cov-2 infection and should not be used as the sole basis for treatment or other patient management decisions. A negative result may occur with  improper specimen collection/handling, submission of specimen other than nasopharyngeal swab, presence of viral mutation(s) within the areas targeted by this assay, and inadequate number of viral copies(<138 copies/mL). A negative result must be combined with clinical observations, patient history, and epidemiological information. The expected result is Negative.  Fact Sheet for Patients:  BloggerCourse.comhttps://www.fda.gov/media/152166/download  Fact Sheet for Healthcare Providers:  SeriousBroker.it  This test is no t yet approved or cleared by the [place] FDA and  has been authorized for detection and/or diagnosis of SARS-CoV-2 by FDA under an Emergency Use Authorization (EUA). This EUA will remain  in effect (meaning this test can be used) for the duration of the COVID-19 declaration under Section 564(b)(1) of the Act, 21 U.S.C.section 360bbb-3(b)(1), unless the authorization is terminated  or revoked sooner.       Influenza A by PCR NEGATIVE NEGATIVE Final   Influenza B by PCR NEGATIVE NEGATIVE Final    Comment: (NOTE) The Xpert Xpress SARS-CoV-2/FLU/RSV plus assay is intended as an aid in the diagnosis of influenza from Nasopharyngeal swab specimens and should not be used as a sole basis for treatment. Nasal washings and aspirates are  unacceptable for Xpert Xpress SARS-CoV-2/FLU/RSV testing.  Fact Sheet for Patients: BloggerCourse.com  Fact Sheet for Healthcare Providers: SeriousBroker.it  This test is not yet approved or cleared by the [place] FDA and has been authorized for detection and/or diagnosis of SARS-CoV-2 by FDA under an Emergency Use Authorization (EUA). This EUA will remain in effect (meaning this test can be used) for the duration of the COVID-19 declaration under Section 564(b)(1) of the Act, 21 U.S.C. section 360bbb-3(b)(1), unless the authorization is terminated or revoked.  Performed at [facility], [address] [place], [place] [address]   MRSA PCR Screening     Status: None   Collection Time: 02/24/20  7:04 PM   Specimen: Nasopharyngeal  Result Value Ref Range Status   MRSA by PCR NEGATIVE NEGATIVE Final    Comment:        The GeneXpert MRSA Assay (FDA approved for NASAL specimens only), is one component of a comprehensive MRSA colonization surveillance program. It is not intended to diagnose MRSA infection nor to guide or monitor treatment for MRSA infections. Performed at [facility], [address] [place], [place] [address]          Radiology Studies: DG Abd 1 View  Result Date: 02/25/2020 CLINICAL DATA:  Abdominal pain, nausea and vomiting. EXAM: ABDOMEN - 1 VIEW COMPARISON:  Plain film of the abdomen dated 01/26/2020. FINDINGS: Bowel gas pattern is nonobstructive. No evidence of soft tissue mass or abnormal fluid collection. No evidence of free intraperitoneal air. No evidence of renal or ureteral calculi. Stable chronic calcification within the LEFT lower pelvis, presumably vascular phlebolith. Visualized osseous structures of the abdomen and pelvis are unremarkable. Lung bases appear clear. IMPRESSION: Negative.  Nonobstructive bowel gas pattern. Electronically Signed   By:  Bary Richard M.D.   On: 02/25/2020 15:43        Scheduled Meds: . Chlorhexidine Gluconate Cloth  6 each Topical Daily  . enoxaparin (LOVENOX) injection  40 mg Subcutaneous Q24H  . insulin aspart  0-15 Units Subcutaneous Q4H  . insulin glargine  12 Units Subcutaneous Daily  . mouth rinse  15 mL Mouth Rinse BID  . metoCLOPramide (REGLAN) injection  10 mg Intravenous Q8H  . metoprolol tartrate  2.5 mg Intravenous Q6H  . pantoprazole (PROTONIX) IV  40 mg Intravenous Q12H  . sucralfate  1 g Oral TID WC & HS   Continuous Infusions: . sodium chloride Stopped (02/25/20 0749)  . sodium chloride 75 mL/hr at 02/26/20 0642  . magnesium sulfate bolus IVPB       LOS: 2 days   Time spent: Greater than 50% of this time  was spent in counseling, explanation of diagnosis, planning of further management, and coordination of care.  I have personally reviewed and interpreted on  02/26/2020 daily labs, tele strips,I reviewed all nursing notes, pharmacy notes,  vitals, pertinent old records  I have discussed plan of care as described above with RN , patient and family on 02/26/2020  Voice Recognition /Dragon dictation system was used to create this note, attempts have been made to correct errors. Please contact the author with questions and/or clarifications.   Albertine Grates, MD PhD FACP [facility]  Available via Epic secure chat 7am-7pm for nonurgent issues Please page for urgent issues To page the attending provider between 7A-7P or the covering provider during after hours 7P-7A, please log into the web site [email or web address] and access using universal [place] password for that web site. If you do not have the password, please call the hospital operator.    02/26/2020, 1:07 PM

## 2020-02-27 ENCOUNTER — Emergency Department (HOSPITAL_COMMUNITY)
Admission: EM | Admit: 2020-02-27 | Discharge: 2020-02-27 | Disposition: A | Discharge status: Home / Self Care | Payer: Medicaid Other | Attending: Emergency Medicine | Admitting: Emergency Medicine

## 2020-02-27 ENCOUNTER — Emergency Department (HOSPITAL_COMMUNITY): Payer: Medicaid Other

## 2020-02-27 ENCOUNTER — Encounter (HOSPITAL_COMMUNITY): Payer: Self-pay

## 2020-02-27 ENCOUNTER — Other Ambulatory Visit: Payer: Self-pay

## 2020-02-27 DIAGNOSIS — I10 Essential (primary) hypertension: Secondary | ICD-10-CM | POA: Insufficient documentation

## 2020-02-27 DIAGNOSIS — R1115 Cyclical vomiting syndrome unrelated to migraine: Secondary | ICD-10-CM | POA: Diagnosis not present

## 2020-02-27 DIAGNOSIS — E11621 Type 2 diabetes mellitus with foot ulcer: Secondary | ICD-10-CM | POA: Insufficient documentation

## 2020-02-27 DIAGNOSIS — E1143 Type 2 diabetes mellitus with diabetic autonomic (poly)neuropathy: Secondary | ICD-10-CM | POA: Diagnosis not present

## 2020-02-27 DIAGNOSIS — R079 Chest pain, unspecified: Secondary | ICD-10-CM | POA: Insufficient documentation

## 2020-02-27 DIAGNOSIS — R109 Unspecified abdominal pain: Secondary | ICD-10-CM | POA: Insufficient documentation

## 2020-02-27 DIAGNOSIS — L97522 Non-pressure chronic ulcer of other part of left foot with fat layer exposed: Secondary | ICD-10-CM | POA: Diagnosis not present

## 2020-02-27 DIAGNOSIS — R112 Nausea with vomiting, unspecified: Secondary | ICD-10-CM | POA: Diagnosis present

## 2020-02-27 DIAGNOSIS — Z87891 Personal history of nicotine dependence: Secondary | ICD-10-CM | POA: Insufficient documentation

## 2020-02-27 DIAGNOSIS — R000 Tachycardia, unspecified: Secondary | ICD-10-CM | POA: Diagnosis not present

## 2020-02-27 DIAGNOSIS — E1100 Type 2 diabetes mellitus with hyperosmolarity without nonketotic hyperglycemic-hyperosmolar coma (NKHHC): Secondary | ICD-10-CM

## 2020-02-27 DIAGNOSIS — K920 Hematemesis: Secondary | ICD-10-CM | POA: Insufficient documentation

## 2020-02-27 DIAGNOSIS — E1165 Type 2 diabetes mellitus with hyperglycemia: Secondary | ICD-10-CM

## 2020-02-27 DIAGNOSIS — Z89422 Acquired absence of other left toe(s): Secondary | ICD-10-CM | POA: Diagnosis not present

## 2020-02-27 DIAGNOSIS — Z794 Long term (current) use of insulin: Secondary | ICD-10-CM | POA: Diagnosis not present

## 2020-02-27 DIAGNOSIS — Z79899 Other long term (current) drug therapy: Secondary | ICD-10-CM | POA: Diagnosis not present

## 2020-02-27 DIAGNOSIS — Z5321 Procedure and treatment not carried out due to patient leaving prior to being seen by health care provider: Secondary | ICD-10-CM | POA: Insufficient documentation

## 2020-02-27 DIAGNOSIS — E1142 Type 2 diabetes mellitus with diabetic polyneuropathy: Secondary | ICD-10-CM | POA: Insufficient documentation

## 2020-02-27 LAB — CBC WITH DIFFERENTIAL/PLATELET
Abs Immature Granulocytes: 0.03 10*3/uL (ref 0.00–0.07)
Basophils Absolute: 0.1 10*3/uL (ref 0.0–0.1)
Basophils Relative: 1 %
Eosinophils Absolute: 0.1 10*3/uL (ref 0.0–0.5)
Eosinophils Relative: 1 %
HCT: 35.3 % — ABNORMAL LOW (ref 36.0–46.0)
Hemoglobin: 11.6 g/dL — ABNORMAL LOW (ref 12.0–15.0)
Immature Granulocytes: 0 %
Lymphocytes Relative: 45 %
Lymphs Abs: 4.2 10*3/uL — ABNORMAL HIGH (ref 0.7–4.0)
MCH: 29.2 pg (ref 26.0–34.0)
MCHC: 32.9 g/dL (ref 30.0–36.0)
MCV: 88.9 fL (ref 80.0–100.0)
Monocytes Absolute: 0.7 10*3/uL (ref 0.1–1.0)
Monocytes Relative: 7 %
Neutro Abs: 4.2 10*3/uL (ref 1.7–7.7)
Neutrophils Relative %: 46 %
Platelets: 360 10*3/uL (ref 150–400)
RBC: 3.97 MIL/uL (ref 3.87–5.11)
RDW: 12.2 % (ref 11.5–15.5)
WBC: 9.2 10*3/uL (ref 4.0–10.5)
nRBC: 0 % (ref 0.0–0.2)

## 2020-02-27 LAB — COMPREHENSIVE METABOLIC PANEL
ALT: 19 U/L (ref 0–44)
AST: 22 U/L (ref 15–41)
Albumin: 3 g/dL — ABNORMAL LOW (ref 3.5–5.0)
Alkaline Phosphatase: 60 U/L (ref 38–126)
Anion gap: 13 (ref 5–15)
BUN: 12 mg/dL (ref 6–20)
CO2: 22 mmol/L (ref 22–32)
Calcium: 9 mg/dL (ref 8.9–10.3)
Chloride: 100 mmol/L (ref 98–111)
Creatinine, Ser: 0.94 mg/dL (ref 0.44–1.00)
GFR, Estimated: >60 mL/min (ref >60)
Glucose, Bld: 212 mg/dL — ABNORMAL HIGH (ref 70–99)
Potassium: 3.4 mmol/L — ABNORMAL LOW (ref 3.5–5.1)
Sodium: 135 mmol/L (ref 135–145)
Total Bilirubin: 1.2 mg/dL (ref 0.3–1.2)
Total Protein: 6.6 g/dL (ref 6.5–8.1)

## 2020-02-27 LAB — CBC
HCT: 38.1 % (ref 36.0–46.0)
HCT: 38.2 % (ref 36.0–46.0)
Hemoglobin: 12.6 g/dL (ref 12.0–15.0)
Hemoglobin: 12.6 g/dL (ref 12.0–15.0)
MCH: 29.2 pg (ref 26.0–34.0)
MCH: 29.2 pg (ref 26.0–34.0)
MCHC: 33.1 g/dL (ref 30.0–36.0)
MCHC: 33 g/dL (ref 30.0–36.0)
MCV: 88.4 fL (ref 80.0–100.0)
MCV: 88.4 fL (ref 80.0–100.0)
Platelets: 399 10*3/uL (ref 150–400)
Platelets: 322 10*3/uL (ref 150–400)
RBC: 4.31 MIL/uL (ref 3.87–5.11)
RBC: 4.32 MIL/uL (ref 3.87–5.11)
RDW: 12.2 % (ref 11.5–15.5)
RDW: 12.2 % (ref 11.5–15.5)
WBC: 7.3 10*3/uL (ref 4.0–10.5)
WBC: 6.8 10*3/uL (ref 4.0–10.5)
nRBC: 0 % (ref 0.0–0.2)
nRBC: 0 % (ref 0.0–0.2)

## 2020-02-27 LAB — BASIC METABOLIC PANEL
Anion gap: 10 (ref 5–15)
Anion gap: 15 (ref 5–15)
BUN: 13 mg/dL (ref 6–20)
BUN: 13 mg/dL (ref 6–20)
CO2: 25 mmol/L (ref 22–32)
CO2: 23 mmol/L (ref 22–32)
Calcium: 8.8 mg/dL — ABNORMAL LOW (ref 8.9–10.3)
Calcium: 9.3 mg/dL (ref 8.9–10.3)
Chloride: 101 mmol/L (ref 98–111)
Chloride: 100 mmol/L (ref 98–111)
Creatinine, Ser: 0.71 mg/dL (ref 0.44–1.00)
Creatinine, Ser: 0.8 mg/dL (ref 0.44–1.00)
GFR, Estimated: >60 mL/min (ref >60)
GFR, Estimated: >60 mL/min (ref >60)
Glucose, Bld: 77 mg/dL (ref 70–99)
Glucose, Bld: 238 mg/dL — ABNORMAL HIGH (ref 70–99)
Potassium: 3.6 mmol/L (ref 3.5–5.1)
Potassium: 3.3 mmol/L — ABNORMAL LOW (ref 3.5–5.1)
Sodium: 136 mmol/L (ref 135–145)
Sodium: 138 mmol/L (ref 135–145)

## 2020-02-27 LAB — GLUCOSE, CAPILLARY
Glucose-Capillary: 144 mg/dL — ABNORMAL HIGH (ref 70–99)
Glucose-Capillary: 79 mg/dL (ref 70–99)
Glucose-Capillary: 77 mg/dL (ref 70–99)

## 2020-02-27 LAB — CBG MONITORING, ED
Glucose-Capillary: 196 mg/dL — ABNORMAL HIGH (ref 70–99)
Glucose-Capillary: 228 mg/dL — ABNORMAL HIGH (ref 70–99)

## 2020-02-27 LAB — MAGNESIUM: Magnesium: 2.1 mg/dL (ref 1.7–2.4)

## 2020-02-27 LAB — LIPASE, BLOOD: Lipase: 22 U/L (ref 11–51)

## 2020-02-27 LAB — TROPONIN I (HIGH SENSITIVITY)
Troponin I (High Sensitivity): 5 ng/L (ref <18)
Troponin I (High Sensitivity): 4 ng/L (ref <18)

## 2020-02-27 LAB — HCG, QUANTITATIVE, PREGNANCY: hCG, Beta Chain, Quant, S: <1 m[IU]/mL (ref <5)

## 2020-02-27 LAB — I-STAT BETA HCG BLOOD, ED (MC, WL, AP ONLY): I-stat hCG, quantitative: <5 m[IU]/mL (ref <5)

## 2020-02-27 MED ORDER — ACETAMINOPHEN 325 MG PO TABS
650.0000 mg | ORAL_TABLET | Freq: Once | ORAL | Status: AC
  Administered 2020-02-27: 650 mg via ORAL
  Filled 2020-02-27: qty 2

## 2020-02-27 MED ORDER — POTASSIUM CHLORIDE 10 MEQ/100ML IV SOLN
INTRAVENOUS | Status: AC
  Administered 2020-02-27: 10 meq via INTRAVENOUS
  Filled 2020-02-27: qty 100

## 2020-02-27 NOTE — ED Notes (Signed)
Notified by screeners that there is a pt on the floor in the covid section. I went out and checked on pt. Pt is sitting on floor stating she cannot breathe. Pt advises she has been having breathing difficulty for the past few hours. I offered to help pt stand and she could walk with me to the consult room where I could draw her lab work and also update her v/s. Pt states she cannot stand or walk and asks for pain medicine. I advised pt that no wheelchairs are available and that I cannot give pain medicine. Pt demanded that I get a doctor immediately and stated that if she dies in the lobby we'll all be sued. Triage RN notified.

## 2020-02-27 NOTE — ED Notes (Signed)
Pt at [facility] ED.  

## 2020-02-27 NOTE — ED Triage Notes (Signed)
Pt reports being discharged from ICU today after being admitted for HHS. Pt sts vomiting blood and sternal chest pain.

## 2020-02-27 NOTE — Discharge Summary (Addendum)
Physician Discharge Summary  Kelli Hudson XIP:[id number] DOB: [birth year] DOA: 02/24/2020  PCP: Libby Maw, MD  Admit date: 02/24/2020 Discharge date: 02/27/2020  Admitted From: Home Disposition:  Home  Discharge Condition:Stable CODE STATUS:FULL Diet recommendation:  Carb Modified   Brief/Interim Summary: Patient is a 30 year old female with PMH of type 1 diabetes uncontrolled, recent diagnosed with hyperthyroidism, presented to the hospital with nausea vomiting abdominal pain and hyperglycemia.  Patient's nausea, vomiting and abdominal pain was attributed to gastroparesis evaluation.  She was treated with IV Reglan.  She follows with [facility] and was last seen on 12/23/2019.  Gastric emptying test was planned over here but patient denies to do that and wants to do as an outpatient.  She denies any nausea, vomiting and abdominal pain today.  She is adamant about being discharged today.  Medically stable for discharge to home.  Following problems were addressed during hospitalization:  Gastroparesis exacerbation -Presented with N/V/ab pain -treated with IV Reglan - KUB personally reviewed unremarkable-She was seen by [facility] on 12/23/2019, was given gastroparesis diet instruction(detailed under care everywhere) -She follows with [facility] and was last seen on 12/23/2019.  Gastric emptying test was planned over here but patient denies to do that and wants to do as an outpatient.  She denies any nausea, vomiting and abdominal pain today -She says she has enough insulin, reglan at home.  She intends to follow-up with her endocrinologist on the given appointment date.  I have encouraged her for close follow-up with her endocrinologist and PCP  Hypokalemia/hypomagnesemia; Supplemented and corrected  Thrombocytosis: likely reactive, normalized  type 1 diabetes, uncontrolled with hyperglycemia, blood glucose on presentation 673,  A1P 37.9 Complicated by gastroparesis  ,retinopathy, neuropathy with recent history of diabetic foot ulcer to left fourth toe Continue home  insulin She is followed by endocrinology Dr Judieth Keens, has an appointment on 1/18.  diabetic foot ulcer, left fourth toe Podiatry Dr Jacqualyn Posey Debrided the hyperkeratotic lesion on the wound on 12/30 Continue antibiotic ointment dressing changes daily.   Hypertension On atenolol and lisinopril at home  Hyperthyroidism probably related to Graves -thyroid ultrasound on 10/07/2019, showing a mildly enlarged thyroid gland with a right lobe measuring 5.6 cm and the left lobe measuring 6.3 cm in greatest dimension, with a 1.2 cm spongiform right thyroid nodule and another 1.5 cm right lower spongiform nodule that was hyperechoic. No suspicious features or other abnormalities were seen. -On methimazole 2.25m BID,  atenolol 253mBID at home,need to go back on home regimen once able to tolerate oral intake She is followed by endocrinology Dr PoJudieth Keenshas an appointment on 1/18  Recent h/o gastritis Shown on EGD in 09/2019 and 01/2020 Continue ppi  H/o pylori infection in 09/2019, treated, per care everywhere   Discharge Diagnoses:  Active Problems:   [facility] state (HHS) [facility]   Discharge Instructions  Discharge Instructions    Diet Carb Modified   Complete by: As directed    Discharge instructions   Complete by: As directed    1)Please continue taking your medications.  Monitor your sugar at home. 2)Follow up with your PCP and endocrinologist in 1 to 2 weeks.   Increase activity slowly   Complete by: As directed      Allergies as of 02/27/2020   No Known Allergies     Medication List    TAKE these medications   ASHWAGANDHA PO Take 1 tablet by mouth daily.   atenolol 25 MG  tablet Commonly known as: TENORMIN Take 1 tablet (25 mg total) by mouth 2 (two) times daily.   Basaglar KwikPen 100 UNIT/ML Inject 15 Units into the skin at bedtime.    insulin lispro 100 UNIT/ML injection Commonly known as: HumaLOG Inject 0.05 mLs (5 Units total) into the skin 3 (three) times daily with meals.   Insulin Pen Needle 31G X 5 MM Misc 1 Device by Does not apply route QID. For use with insulin pens   lisinopril 5 MG tablet Commonly known as: ZESTRIL Take 5 mg by mouth daily.   metoCLOPramide 5 MG tablet Commonly known as: Reglan Take 1 tablet (5 mg total) by mouth 3 (three) times daily before meals.   multivitamin with minerals Tabs tablet Take 1 tablet by mouth daily.   mupirocin ointment 2 % Commonly known as: BACTROBAN Apply 1 application topically 2 (two) times daily. What changed:   when to take this  reasons to take this  additional instructions   norethindrone-ethinyl estradiol 1-20 MG-MCG tablet Commonly known as: LOESTRIN Take 1 tablet by mouth daily.   ondansetron 4 MG disintegrating tablet Commonly known as: Zofran ODT Take 1 tablet (4 mg total) by mouth every 8 (eight) hours as needed for nausea or vomiting.   pantoprazole 40 MG tablet Commonly known as: PROTONIX Take 1 tablet (40 mg total) by mouth daily.   True Metrix Blood Glucose Test test strip Generic drug: glucose blood Use as instructed   True Metrix Meter w/Device Kit 1 Device by Does not apply route 4 (four) times daily.   TRUEplus Lancets 28G Misc assist with checking blood sugar TID and qhs       Follow-up Information    Libby Maw, MD. Schedule an appointment as soon as possible for a visit in 1 week(s).   Specialty: Family Medicine Contact information: [place] [address] [phone]              No Known Allergies  Consultations:  None   Procedures/Studies: DG Abd 1 View  Result Date: 02/25/2020 CLINICAL DATA:  Abdominal pain, nausea and vomiting. EXAM: ABDOMEN - 1 VIEW COMPARISON:  Plain film of the abdomen dated 01/26/2020. FINDINGS: Bowel gas pattern is nonobstructive. No  evidence of soft tissue mass or abnormal fluid collection. No evidence of free intraperitoneal air. No evidence of renal or ureteral calculi. Stable chronic calcification within the LEFT lower pelvis, presumably vascular phlebolith. Visualized osseous structures of the abdomen and pelvis are unremarkable. Lung bases appear clear. IMPRESSION: Negative.  Nonobstructive bowel gas pattern. Electronically Signed   By: Franki Cabot M.D.   On: 02/25/2020 15:43       Subjective: Patient seen and examined at bedside this morning.  Comfortable. Hemodynamically  stable for discharge  Discharge Exam: Vitals:   02/27/20 0500 02/27/20 0600  BP: (!) 140/95 (!) 143/93  Pulse: 96 97  Resp: 12 (!) 9  Temp:    SpO2: 98% 99%   Vitals:   02/27/20 0300 02/27/20 0400 02/27/20 0500 02/27/20 0600  BP: (!) 141/84 (!) 143/96 (!) 140/95 (!) 143/93  Pulse: (!) 102 94 96 97  Resp: 13 14 12  (!) 9  Temp:  98 F (36.7 C)    TempSrc:  Oral    SpO2: 98% 98% 98% 99%  Weight:        General: Pt is alert, awake, not in acute distress Cardiovascular: RRR, S1/S2 +, no rubs, no gallops Respiratory: CTA bilaterally, no wheezing, no rhonchi  Abdominal: Soft, NT, ND, bowel sounds + Extremities: no edema, no cyanosis    The results of significant diagnostics from this hospitalization (including imaging, microbiology, ancillary and laboratory) are listed below for reference.     Microbiology: Recent Results (from the past 240 hour(s))  Resp Panel by RT-PCR (Flu A&B, Covid) Nasopharyngeal Swab     Status: None   Collection Time: 02/24/20  7:24 AM   Specimen: Nasopharyngeal Swab; Nasopharyngeal(NP) swabs in vial transport medium  Result Value Ref Range Status   SARS Coronavirus 2 by RT PCR NEGATIVE NEGATIVE Final    Comment: (NOTE) SARS-CoV-2 target nucleic acids are NOT DETECTED.  The SARS-CoV-2 RNA is generally detectable in upper respiratory specimens during the acute phase of infection. The  lowest concentration of SARS-CoV-2 viral copies this assay can detect is 138 copies/mL. A negative result does not preclude SARS-Cov-2 infection and should not be used as the sole basis for treatment or other patient management decisions. A negative result may occur with  improper specimen collection/handling, submission of specimen other than nasopharyngeal swab, presence of viral mutation(s) within the areas targeted by this assay, and inadequate number of viral copies(<138 copies/mL). A negative result must be combined with clinical observations, patient history, and epidemiological information. The expected result is Negative.  Fact Sheet for Patients:  EntrepreneurPulse.com.au  Fact Sheet for Healthcare Providers:  IncredibleEmployment.be  This test is no t yet approved or cleared by the [place] FDA and  has been authorized for detection and/or diagnosis of SARS-CoV-2 by FDA under an Emergency Use Authorization (EUA). This EUA will remain  in effect (meaning this test can be used) for the duration of the COVID-19 declaration under Section 564(b)(1) of the Act, 21 U.S.C.section 360bbb-3(b)(1), unless the authorization is terminated  or revoked sooner.       Influenza A by PCR NEGATIVE NEGATIVE Final   Influenza B by PCR NEGATIVE NEGATIVE Final    Comment: (NOTE) The Xpert Xpress SARS-CoV-2/FLU/RSV plus assay is intended as an aid in the diagnosis of influenza from Nasopharyngeal swab specimens and should not be used as a sole basis for treatment. Nasal washings and aspirates are unacceptable for Xpert Xpress SARS-CoV-2/FLU/RSV testing.  Fact Sheet for Patients: EntrepreneurPulse.com.au  Fact Sheet for Healthcare Providers: IncredibleEmployment.be  This test is not yet approved or cleared by the [place] FDA and has been authorized for detection and/or diagnosis of SARS-CoV-2 by FDA under  an Emergency Use Authorization (EUA). This EUA will remain in effect (meaning this test can be used) for the duration of the COVID-19 declaration under Section 564(b)(1) of the Act, 21 U.S.C. section 360bbb-3(b)(1), unless the authorization is terminated or revoked.  Performed at [facility], [place] [address], [place], [place] [address]   MRSA PCR Screening     Status: None   Collection Time: 02/24/20  7:04 PM   Specimen: Nasopharyngeal  Result Value Ref Range Status   MRSA by PCR NEGATIVE NEGATIVE Final    Comment:        The GeneXpert MRSA Assay (FDA approved for NASAL specimens only), is one component of a comprehensive MRSA colonization surveillance program. It is not intended to diagnose MRSA infection nor to guide or monitor treatment for MRSA infections. Performed at [facility], [place] [address] [place], [place] [address]      Labs: BNP (last 3 results) No results for input(s): BNP in the last 8760 hours. Basic Metabolic Panel: Recent Labs  Lab 02/24/20 1922 02/24/20  2350 02/25/20 0235 02/26/20 0228 02/27/20 0206  NA 140 138 136 139 136  K 3.5 3.2* 3.4* 3.6 3.6  CL 104 102 103 106 101  CO2 25 26 26 25 25   GLUCOSE 161* 190* 180* 108* 77  BUN 16 13 14 16 13   CREATININE 0.74 0.66 0.84 0.72 0.71  CALCIUM 9.2 9.3 8.6* 8.5* 8.8*  MG  --   --   --  1.4* 2.1   Liver Function Tests: Recent Labs  Lab 02/24/20 0929  AST 23  ALT 22  ALKPHOS 75  BILITOT 0.9  PROT 8.7*  ALBUMIN 3.7   Recent Labs  Lab 02/24/20 0929  LIPASE 20   No results for input(s): AMMONIA in the last 168 hours. CBC: Recent Labs  Lab 02/23/20 1949 02/24/20 0929 02/26/20 0228 02/27/20 0206  WBC 8.2 9.9 11.3* 9.2  NEUTROABS  --  7.9* 6.1 4.2  HGB 13.1 13.6 11.0* 11.6*  HCT 39.7 40.8 33.1* 35.3*  MCV 89.0 89.9 90.7 88.9  PLT 416* 456* 328 360   Cardiac Enzymes: No results for input(s): CKTOTAL, CKMB, CKMBINDEX, TROPONINI in the last 168  hours. BNP: Invalid input(s): POCBNP CBG: Recent Labs  Lab 02/26/20 1721 02/26/20 2008 02/26/20 2308 02/27/20 0400 02/27/20 0819  GLUCAP 128* 94 85 79 77   D-Dimer No results for input(s): DDIMER in the last 72 hours. Hgb A1c Recent Labs    02/24/20 0934  HGBA1C 10.8*   Lipid Profile No results for input(s): CHOL, HDL, LDLCALC, TRIG, CHOLHDL, LDLDIRECT in the last 72 hours. Thyroid function studies Recent Labs    02/26/20 0228  TSH 0.882   Anemia work up No results for input(s): VITAMINB12, FOLATE, FERRITIN, TIBC, IRON, RETICCTPCT in the last 72 hours. Urinalysis    Component Value Date/Time   COLORURINE YELLOW 02/24/2020 0221   APPEARANCEUR CLEAR 02/24/2020 0221   LABSPEC 1.025 02/24/2020 0221   PHURINE 5.0 02/24/2020 0221   GLUCOSEU >=500 (A) 02/24/2020 0221   GLUCOSEU >=1000 (A) 07/05/2019 1343   HGBUR SMALL (A) 02/24/2020 0221   BILIRUBINUR NEGATIVE 02/24/2020 0221   KETONESUR 20 (A) 02/24/2020 0221   PROTEINUR 100 (A) 02/24/2020 0221   UROBILINOGEN 0.2 07/05/2019 1343   NITRITE NEGATIVE 02/24/2020 0221   LEUKOCYTESUR NEGATIVE 02/24/2020 0221   Sepsis Labs Invalid input(s): PROCALCITONIN,  WBC,  LACTICIDVEN Microbiology Recent Results (from the past 240 hour(s))  Resp Panel by RT-PCR (Flu A&B, Covid) Nasopharyngeal Swab     Status: None   Collection Time: 02/24/20  7:24 AM   Specimen: Nasopharyngeal Swab; Nasopharyngeal(NP) swabs in vial transport medium  Result Value Ref Range Status   SARS Coronavirus 2 by RT PCR NEGATIVE NEGATIVE Final    Comment: (NOTE) SARS-CoV-2 target nucleic acids are NOT DETECTED.  The SARS-CoV-2 RNA is generally detectable in upper respiratory specimens during the acute phase of infection. The lowest concentration of SARS-CoV-2 viral copies this assay can detect is 138 copies/mL. A negative result does not preclude SARS-Cov-2 infection and should not be used as the sole basis for treatment or other patient management  decisions. A negative result may occur with  improper specimen collection/handling, submission of specimen other than nasopharyngeal swab, presence of viral mutation(s) within the areas targeted by this assay, and inadequate number of viral copies(<138 copies/mL). A negative result must be combined with clinical observations, patient history, and epidemiological information. The expected result is Negative.  Fact Sheet for Patients:  EntrepreneurPulse.com.au  Fact Sheet for Healthcare Providers:  IncredibleEmployment.be  This  test is no t yet approved or cleared by the [place] and  has been authorized for detection and/or diagnosis of SARS-CoV-2 by FDA under an Emergency Use Authorization (EUA). This EUA will remain  in effect (meaning this test can be used) for the duration of the COVID-19 declaration under Section 564(b)(1) of the Act, 21 U.S.C.section 360bbb-3(b)(1), unless the authorization is terminated  or revoked sooner.       Influenza A by PCR NEGATIVE NEGATIVE Final   Influenza B by PCR NEGATIVE NEGATIVE Final    Comment: (NOTE) The Xpert Xpress SARS-CoV-2/FLU/RSV plus assay is intended as an aid in the diagnosis of influenza from Nasopharyngeal swab specimens and should not be used as a sole basis for treatment. Nasal washings and aspirates are unacceptable for Xpert Xpress SARS-CoV-2/FLU/RSV testing.  Fact Sheet for Patients: EntrepreneurPulse.com.au  Fact Sheet for Healthcare Providers: IncredibleEmployment.be  This test is not yet approved or cleared by the [place] FDA and has been authorized for detection and/or diagnosis of SARS-CoV-2 by FDA under an Emergency Use Authorization (EUA). This EUA will remain in effect (meaning this test can be used) for the duration of the COVID-19 declaration under Section 564(b)(1) of the Act, 21 U.S.C. section 360bbb-3(b)(1), unless the  authorization is terminated or revoked.  Performed at [facility], [place] [address] [place], [place] [address]   MRSA PCR Screening     Status: None   Collection Time: 02/24/20  7:04 PM   Specimen: Nasopharyngeal  Result Value Ref Range Status   MRSA by PCR NEGATIVE NEGATIVE Final    Comment:        The GeneXpert MRSA Assay (FDA approved for NASAL specimens only), is one component of a comprehensive MRSA colonization surveillance program. It is not intended to diagnose MRSA infection nor to guide or monitor treatment for MRSA infections. Performed at [name], [place] [address], [place], [place] [address]     Please note: You were cared for by a hospitalist during your hospital stay. Once you are discharged, your primary care physician will handle any further medical issues. Please note that NO REFILLS for any discharge medications will be authorized once you are discharged, as it is imperative that you return to your primary care physician (or establish a relationship with a primary care physician if you do not have one) for your post hospital discharge needs so that they can reassess your need for medications and monitor your lab values.    Time coordinating discharge: 40 minutes  SIGNED:   Shelly Coss, MD  [facility] 02/27/2020, 9:16 AM Pager [phone]  If 7PM-7AM, please contact night-coverage [email or web address] Password [email or web address]

## 2020-02-27 NOTE — ED Triage Notes (Signed)
Pt reports chest pain, abd pain, and hematemesis since being discharged from [facility] earlier today for gastroparesis. Pt is diabetic.

## 2020-02-27 NOTE — ED Notes (Signed)
Pt was brought back and allowed lab work to be completed. She was updated about the delay by Jacki Cones, Neurosurgeon.

## 2020-02-28 ENCOUNTER — Emergency Department (HOSPITAL_COMMUNITY)
Admission: EM | Admit: 2020-02-28 | Discharge: 2020-02-28 | Disposition: A | Discharge status: Home / Self Care | Payer: 59 | Attending: Emergency Medicine | Admitting: Emergency Medicine

## 2020-02-28 DIAGNOSIS — R1115 Cyclical vomiting syndrome unrelated to migraine: Secondary | ICD-10-CM

## 2020-02-28 MED ORDER — HYDROMORPHONE HCL 1 MG/ML IJ SOLN
1.0000 mg | Freq: Once | INTRAMUSCULAR | Status: AC
  Administered 2020-02-28: 1 mg via INTRAVENOUS
  Filled 2020-02-28: qty 1

## 2020-02-28 MED ORDER — DICYCLOMINE HCL 20 MG PO TABS: 20.0000 mg | ORAL_TABLET | Freq: Three times a day (TID) | ORAL | 0 refills | Status: DC

## 2020-02-28 MED ORDER — SODIUM CHLORIDE 0.9 % IV BOLUS
2000.0000 mL | Freq: Once | INTRAVENOUS | Status: AC
  Administered 2020-02-28: 2000 mL via INTRAVENOUS

## 2020-02-28 MED ORDER — METOCLOPRAMIDE HCL 5 MG/ML IJ SOLN
10.0000 mg | Freq: Once | INTRAMUSCULAR | Status: AC
  Administered 2020-02-28: 10 mg via INTRAVENOUS
  Filled 2020-02-28: qty 2

## 2020-02-28 MED ORDER — DIPHENHYDRAMINE HCL 50 MG/ML IJ SOLN
25.0000 mg | Freq: Once | INTRAMUSCULAR | Status: AC
  Administered 2020-02-28: 25 mg via INTRAVENOUS
  Filled 2020-02-28: qty 1

## 2020-02-28 MED ORDER — KETAMINE HCL 50 MG/5ML IJ SOSY
15.0000 mg | PREFILLED_SYRINGE | Freq: Once | INTRAMUSCULAR | Status: AC
  Administered 2020-02-28: 15 mg via INTRAVENOUS
  Filled 2020-02-28: qty 5

## 2020-02-28 NOTE — ED Notes (Signed)
[name] would like to be updated whenever possible about plan of care. Her number is in the chart.

## 2020-02-28 NOTE — Discharge Instructions (Signed)
You were seen in the emergency room today with vomiting and abdominal pain.  You can try the medication sent to your pharmacy to help with pain symptoms.  Please continue your home medicines for nausea and call your GI doctors today to confirm your outpatient gastric emptying study and schedule a follow-up appointment.

## 2020-02-28 NOTE — ED Provider Notes (Signed)
Blood pressure (!) 164/109, pulse (!) 120, temperature 98.3 F (36.8 C), temperature source Oral, resp. rate 10, height 5\' 8"  (1.727 m), weight 73 kg, SpO2 98 %.  Assuming care from Dr. .  In short, Kelli Hudson is a 30 y.o. female with a chief complaint of Vomiting .  Refer to the original H&P for additional details.  The current plan of care is to f/u after meds.  08:42 AM  Labs reviewed with very mild hypokalemia but no other acute findings.  Glucose is 238 with normal CO2 and no anion gap. Will give additional pain medication and discharge with f/u plan for gastric emptying study planned for the 22nd of this month.   09:37 AM  Patient with no vomiting here in the ED.  After pain medications her heart rate is improved and blood pressure has come down to normal level.  She does not have a peritoneal abdomen.  My suspicion for acute surgical pathology in the abdomen is very low.  Plan for Bentyl at home along with nausea medicine which the patient already has at home.  She will touch base with her PCP and GI doctors today by phone.    23, MD 02/28/20 [phone]

## 2020-02-28 NOTE — ED Notes (Addendum)
Patient has called the Charge RN phone 9 times since arrival stating she needs a bed, that she cannot wait in the lobby. This nurse continuously reminds patient that we are working as fast as possible and that there is staff in triage she can go to for updates and vital rechecks ect. Patient verbalized understanding.

## 2020-02-28 NOTE — ED Provider Notes (Signed)
[facility]-EMERGENCY DEPT Provider Note   CSN: [id number] Arrival date & time: 02/27/20  2031     History Chief Complaint  Patient presents with  . Vomiting    Kelli Hudson is a 29 y.o. female.  29 yo F with a chief complaints of nausea and vomiting.  Going on for the past week.  Has a history of the same.  Patient states usually Dilaudid is the only thing that makes this better.  She denies any fevers.  Just left the hospital yesterday.  The history is provided by the patient.  Illness Severity:  Moderate Onset quality:  Gradual Duration:  2 days Timing:  Constant Progression:  Worsening Chronicity:  New Associated symptoms: nausea and vomiting   Associated symptoms: no chest pain, no congestion, no fever, no headaches, no myalgias, no rhinorrhea, no shortness of breath and no wheezing        Past Medical History:  Diagnosis Date  . Diabetes mellitus without complication (HCC)   . Hypertension     Patient Active Problem List   Diagnosis Date Noted  . Hyperosmolar hyperglycemic state (HHS) (HCC) 02/24/2020  . Intractable nausea and vomiting   . Intractable abdominal pain 01/24/2020  . Depression with anxiety 07/05/2019  . Need for Tdap vaccination 07/05/2019  . Diabetic foot ulcer (HCC) 02/21/2018  . Skin ulcer of left foot with fat layer exposed (HCC) 09/24/2017  . Diabetic polyneuropathy associated with type 2 diabetes mellitus (HCC) 09/24/2017  . Pain in right foot 09/24/2017  . Diabetic ketoacidosis without coma associated with type 2 diabetes mellitus (HCC) 01/02/2016  . Gastroparesis   . Leukocytosis   . Hyperglycemia   . Abdominal pain   . Diabetes mellitus due to underlying condition, uncontrolled, with hyperosmolarity without coma, without long-term current use of insulin (HCC)   . Increased frequency of urination 06/14/2011  . UTI (urinary tract infection) 06/14/2011  . Goiter 10/05/2009  . Uncontrolled diabetes  mellitus (HCC) 10/05/2009  . Essential hypertension 10/05/2009    Past Surgical History:  Procedure Laterality Date  . AMPUTATION TOE Left 03/10/2018   Procedure: AMPUTATION FIFTH TOE;  Surgeon: Wagoner, Matthew R, DPM;  Location: [facility];  Service: Podiatry;  Laterality: Left;  . BIOPSY  01/28/2020   Procedure: BIOPSY;  Surgeon: Brahmbhatt, Parag, MD;  Location: [facility];  Service: Gastroenterology;;  . ESOPHAGOGASTRODUODENOSCOPY N/A 01/28/2020   Procedure: ESOPHAGOGASTRODUODENOSCOPY (EGD);  Surgeon: Brahmbhatt, Parag, MD;  Location: [facility];  Service: Gastroenterology;  Laterality: N/A;  . ESOPHAGOGASTRODUODENOSCOPY (EGD) WITH PROPOFOL Left 09/08/2015   Procedure: ESOPHAGOGASTRODUODENOSCOPY (EGD) WITH PROPOFOL;  Surgeon: William Outlaw, MD;  Location: [facility];  Service: Endoscopy;  Laterality: Left;  . WISDOM TOOTH EXTRACTION       OB History   No obstetric history on file.     Family History  Problem Relation Age of Onset  . Lung cancer Mother   . Bipolar disorder Father     Social History   Tobacco Use  . Smoking status: Former Smoker  . Smokeless tobacco: Never Used  Vaping Use  . Vaping Use: Never used  Substance Use Topics  . Alcohol use: Yes    Alcohol/week: 1.0 standard drink    Types: 1 Shots of liquor per week    Comment: occasional   . Drug use: No    Home Medications Prior to Admission medications   Medication Sig Start Date End Date Taking? Authorizing Provider  ASHWAGANDHA PO Take 1 tablet by mouth daily.      [provider]  atenolol (TENORMIN) 25 MG tablet Take 1 tablet (25 mg total) by mouth 2 (two) times daily. 01/30/20   Nettey, Ralph A, MD  Blood Glucose Monitoring Suppl (TRUE METRIX METER) w/Device KIT 1 Device by Does not apply route 4 (four) times daily. 07/05/19   [name], William Alfred, MD  glucose blood (TRUE METRIX BLOOD GLUCOSE TEST) test strip Use as instructed 07/05/19   [name], William Alfred, MD   Insulin Glargine (BASAGLAR KWIKPEN) 100 UNIT/ML Inject 15 Units into the skin at bedtime.  12/24/19   [provider]  insulin lispro (HUMALOG) 100 UNIT/ML injection Inject 0.05 mLs (5 Units total) into the skin 3 (three) times daily with meals. 07/05/19   [name], William Alfred, MD  Insulin Pen Needle 31G X 5 MM MISC 1 Device by Does not apply route QID. For use with insulin pens 07/05/19   [name], William Alfred, MD  lisinopril (ZESTRIL) 5 MG tablet Take 5 mg by mouth daily.  10/17/19   [provider]  metoCLOPramide (REGLAN) 5 MG tablet Take 1 tablet (5 mg total) by mouth 3 (three) times daily before meals. 01/30/20   Nettey, Ralph A, MD  Multiple Vitamin (MULTIVITAMIN WITH MINERALS) TABS tablet Take 1 tablet by mouth daily.    [provider]  mupirocin ointment (BACTROBAN) 2 % Apply 1 application topically 2 (two) times daily. Patient taking differently: Apply 1 application topically 2 (two) times daily as needed. Wound care 12/23/19   Wagoner, Matthew R, DPM  norethindrone-ethinyl estradiol (LOESTRIN) 1-20 MG-MCG tablet Take 1 tablet by mouth daily.    [provider]  ondansetron (ZOFRAN ODT) 4 MG disintegrating tablet Take 1 tablet (4 mg total) by mouth every 8 (eight) hours as needed for nausea or vomiting. 01/30/20   Nettey, Ralph A, MD  pantoprazole (PROTONIX) 40 MG tablet Take 1 tablet (40 mg total) by mouth daily. 01/30/20   Nettey, Ralph A, MD  TRUEPLUS LANCETS 28G MISC assist with checking blood sugar TID and qhs Patient not taking: No sig reported 09/10/15   Noel, Tiffany S, PA-C  insulin aspart (NOVOLOG) 100 UNIT/ML FlexPen Inject 5 Units into the skin 3 (three) times daily with meals. 02/23/18 07/05/19  Chiu, Stephen K, MD  promethazine (PHENERGAN) 25 MG tablet Take 1 tablet (25 mg total) by mouth every 8 (eight) hours as needed for nausea or vomiting. Patient not taking: Reported on 07/05/2019 03/10/18 10/04/19  Wagoner, Matthew R, DPM    Allergies     Patient has no known allergies.  Review of Systems   Review of Systems  Constitutional: Negative for chills and fever.  HENT: Negative for congestion and rhinorrhea.   Eyes: Negative for redness and visual disturbance.  Respiratory: Negative for shortness of breath and wheezing.   Cardiovascular: Negative for chest pain and palpitations.  Gastrointestinal: Positive for nausea and vomiting.  Genitourinary: Negative for dysuria and urgency.  Musculoskeletal: Negative for arthralgias and myalgias.  Skin: Negative for pallor and wound.  Neurological: Negative for dizziness and headaches.    Physical Exam Updated Vital Signs BP (!) 164/109   Pulse (!) 120   Temp 98.3 F (36.8 C) (Oral)   Resp 10   Ht 5' 8" (1.727 m)   Wt 73 kg   SpO2 98%   BMI 24.48 kg/m   Physical Exam Vitals and nursing note reviewed.  Constitutional:      General: She is not in acute distress.    Appearance: She is well-developed and   well-nourished. She is not diaphoretic.  HENT:     Head: Normocephalic and atraumatic.  Eyes:     Extraocular Movements: EOM normal.     Pupils: Pupils are equal, round, and reactive to light.  Cardiovascular:     Rate and Rhythm: Normal rate and regular rhythm.     Heart sounds: No murmur heard. No friction rub. No gallop.   Pulmonary:     Effort: Pulmonary effort is normal.     Breath sounds: No wheezing or rales.  Abdominal:     General: There is no distension.     Palpations: Abdomen is soft.     Tenderness: There is no abdominal tenderness.  Musculoskeletal:        General: Tenderness (mild diffuse) present. No edema.     Cervical back: Normal range of motion and neck supple.  Skin:    General: Skin is warm and dry.  Neurological:     Mental Status: She is alert and oriented to person, place, and time.  Psychiatric:        Mood and Affect: Mood and affect normal.        Behavior: Behavior normal.     ED Results / Procedures / Treatments   Labs (all  labs ordered are listed, but only abnormal results are displayed) Labs Reviewed  BASIC METABOLIC PANEL - Abnormal; Notable for the following components:      Result Value   Potassium 3.3 (*)    Glucose, Bld 238 (*)    All other components within normal limits  CBG MONITORING, ED - Abnormal; Notable for the following components:   Glucose-Capillary 228 (*)    All other components within normal limits  CBC  HCG, QUANTITATIVE, PREGNANCY  TROPONIN I (HIGH SENSITIVITY)  TROPONIN I (HIGH SENSITIVITY)    EKG EKG Interpretation  Date/Time:  Monday February 27 2020 20:52:58 EST Ventricular Rate:  135 PR Interval:    QRS Duration: 72 QT Interval:  335 QTC Calculation: 503 R Axis:   60 Text Interpretation: Sinus tachycardia Consider right atrial enlargement Nonspecific T abnormalities, lateral leads Prolonged QT interval 12 Lead; Mason-Likar No significant change since last tracing Confirmed by ,  ([id number]) on 02/28/2020 5:21:37 AM   Radiology DG Chest 2 View  Result Date: 02/27/2020 CLINICAL DATA:  Chest pain EXAM: CHEST - 2 VIEW COMPARISON:  01/24/2020 FINDINGS: The heart size and mediastinal contours are within normal limits. Both lungs are clear. The visualized skeletal structures are unremarkable. IMPRESSION: Negative. Electronically Signed   By: Kevin  Dover M.D.   On: 02/27/2020 19:02    Procedures Procedures (including critical care time)  Medications Ordered in ED Medications  metoCLOPramide (REGLAN) injection 10 mg (has no administration in time range)  diphenhydrAMINE (BENADRYL) injection 25 mg (has no administration in time range)  ketamine 50 mg in normal saline 5 mL (10 mg/mL) syringe (has no administration in time range)  sodium chloride 0.9 % bolus 2,000 mL (has no administration in time range)    ED Course  I have reviewed the triage vital signs and the nursing notes.  Pertinent labs & imaging results that were available during my care of the patient were  reviewed by me and considered in my medical decision making (see chart for details).    MDM Rules/Calculators/A&P                          29 yo  F with a chief complaints of   intractable nausea and vomiting.  Patient has a history of the same.  Was just in the hospital and then left [place].  She has continued to have persistent nausea and vomiting.  Tachycardic into the 130s here.  We will give a bolus of IV fluids attempt to treat nausea and vomiting.  Reassess.  Signed out to Dr. Laverta Baltimore, please see his note for further details of care in the ED.   The patients results and plan were reviewed and discussed.   Any x-rays performed were independently reviewed by myself.   Differential diagnosis were considered with the presenting HPI.  Medications  metoCLOPramide (REGLAN) injection 10 mg (has no administration in time range)  diphenhydrAMINE (BENADRYL) injection 25 mg (has no administration in time range)  ketamine 50 mg in normal saline 5 mL (10 mg/mL) syringe (has no administration in time range)  sodium chloride 0.9 % bolus 2,000 mL (has no administration in time range)    Vitals:   02/27/20 2049 02/27/20 2247 02/28/20 0630  BP: (!) 165/129 (!) 156/127 (!) 164/109  Pulse: (!) 136 (!) 137 (!) 120  Resp: (!) 22 (!) 22 10  Temp: 98.3 F (36.8 C)    TempSrc: Oral    SpO2: 100% 100% 98%  Weight: 73 kg    Height: 5' 8" (1.727 m)      Final diagnoses:  Cyclic vomiting syndrome    Admission/ observation were discussed with the admitting physician, patient and/or family and they are comfortable with the plan.   Final Clinical Impression(s) / ED Diagnoses Final diagnoses:  Cyclic vomiting syndrome    Rx / DC Orders ED Discharge Orders    None       Deno Etienne, DO 02/28/20 [id number]

## 2020-02-29 ENCOUNTER — Telehealth: Payer: Self-pay

## 2020-02-29 NOTE — Telephone Encounter (Signed)
Transition Care Management Follow-up Telephone Call  Date of discharge and from where: 02/27/20-[place]  How have you been since you were released from the hospital? Still having some pain & stomach issues. Seen in the ER x 2 since discharge  Any questions or concerns? No  Items Reviewed:  Did the pt receive and understand the discharge instructions provided? Yes   Medications obtained and verified? Yes   Other? Yes   Any new allergies since your discharge? No   Dietary orders reviewed? Yes  Do you have support at home? No but she is able to call someone if needed  Home Care and Equipment/Supplies: Were home health services ordered? no If so, what is the name of the agency? n/a  Has the agency set up a time to come to the patient's home? n/a Were any new equipment or medical supplies ordered?  No What is the name of the medical supply agency? n/a Were you able to get the supplies/equipment? not applicable Do you have any questions related to the use of the equipment or supplies? n/a  Functional Questionnaire: (I = Independent and D = Dependent) ADLs: I  Bathing/Dressing- I  Meal Prep- I  Eating- I  Maintaining continence- I  Transferring/Ambulation- I  Managing Meds- I  Follow up appointments reviewed:   PCP Hospital f/u appt confirmed? Yes  Scheduled to see Worthy Rancher on 03/06/20 @ 2:15.  Specialist Hospital f/u appt confirmed? n/a   Are transportation arrangements needed? No   If their condition worsens, is the pt aware to call PCP or go to the Emergency Dept.? Yes  Was the patient provided with contact information for the PCP's office or ED? Yes  Was to pt encouraged to call back with questions or concerns? Yes

## 2020-03-01 ENCOUNTER — Other Ambulatory Visit: Payer: Managed Care, Other (non HMO)

## 2020-03-02 ENCOUNTER — Other Ambulatory Visit: Payer: Self-pay

## 2020-03-05 NOTE — Telephone Encounter (Signed)
Hi Ms. Cogle,  I have tried calling again and the voicemail is still full. Unfortantely we need to reschedule your appointment. Worthy Rancher, NP will no be in the office on 03/06/2020. We are also needing to obtain your new insurance information. Please call the office at [phone] to reschedule. The office will re-open on Tuesday 03/06/2020 at 12:00pm due to inclement weather. Thank you.  Danford Bad

## 2020-03-06 ENCOUNTER — Inpatient Hospital Stay: Payer: Medicaid Other | Admitting: Family

## 2020-03-07 ENCOUNTER — Other Ambulatory Visit: Payer: Self-pay

## 2020-03-07 ENCOUNTER — Ambulatory Visit (INDEPENDENT_AMBULATORY_CARE_PROVIDER_SITE_OTHER): Payer: 59 | Admitting: Family

## 2020-03-07 ENCOUNTER — Encounter: Payer: Self-pay | Admitting: Family

## 2020-03-07 VITALS — BP 115/68 | HR 93 | Temp 98.2°F | Ht 68.0 in | Wt 154.8 lb

## 2020-03-07 DIAGNOSIS — R634 Abnormal weight loss: Secondary | ICD-10-CM | POA: Diagnosis not present

## 2020-03-07 DIAGNOSIS — E0865 Diabetes mellitus due to underlying condition with hyperglycemia: Secondary | ICD-10-CM | POA: Diagnosis not present

## 2020-03-07 DIAGNOSIS — I10 Essential (primary) hypertension: Secondary | ICD-10-CM

## 2020-03-07 MED ORDER — ENSURE COMPLETE PO LIQD: 237.0000 mL | Freq: Two times a day (BID) | ORAL | 3 refills | Status: DC

## 2020-03-07 MED ORDER — INSULIN LISPRO 100 UNIT/ML ~~LOC~~ SOLN: 5.0000 [IU] | Freq: Three times a day (TID) | SUBCUTANEOUS | 5 refills | Status: DC

## 2020-03-07 NOTE — Progress Notes (Signed)
Established Patient Office Visit  Subjective:  Patient ID: Kelli Hudson, female    DOB: [birth year]  Age: 30 y.o. MRN: [id number]  CC: No chief complaint on file.   HPI Kelli Hudson is a 30 year old female patient of Dr. Ethelene Hal with a history of Type1 DM and HTN. She presents today for a hospital follow-up from 02/28/2020. Patient presented to the hospital with nausea and vomiting after consuming a salad. She reports feeling much better and has not had any additional episodes. She has a history of Type 1 DM and sees an Musician through [employer] [facility] [employer]. She would like a referral to a [facility] Endocrinologist. She currently has a Automotive engineer. Last A1c was 10.8. Mother is present and wants to see better control of her DM. She is also concerned that she has lost 15 lbs in the last month. Patient reports eating but being limited on what doesn't upset her stomach. She is entering a gastroparesis study but is awaiting a confirmation of the diagnosis. Does not routinely exercise. She would like a rx for Ensure. She sees a opthalmology and podiatrist routinely.   Past Medical History:  Diagnosis Date  . Diabetes mellitus without complication ([place])   . Hypertension     Past Surgical History:  Procedure Laterality Date  . AMPUTATION TOE Left 03/10/2018   Procedure: AMPUTATION FIFTH TOE;  Surgeon: Trula Slade, DPM;  Location: [facility];  Service: Podiatry;  Laterality: Left;  . BIOPSY  01/28/2020   Procedure: BIOPSY;  Surgeon: Otis Brace, MD;  Location: [facility];  Service: Gastroenterology;;  . ESOPHAGOGASTRODUODENOSCOPY N/A 01/28/2020   Procedure: ESOPHAGOGASTRODUODENOSCOPY (EGD);  Surgeon: Otis Brace, MD;  Location: [facility];  Service: Gastroenterology;  Laterality: N/A;  . ESOPHAGOGASTRODUODENOSCOPY (EGD) WITH PROPOFOL Left 09/08/2015   Procedure: ESOPHAGOGASTRODUODENOSCOPY (EGD) WITH PROPOFOL;  Surgeon: Arta Silence, MD;  Location: [facility]  ENDOSCOPY;  Service: Endoscopy;  Laterality: Left;  . WISDOM TOOTH EXTRACTION      Family History  Problem Relation Age of Onset  . Lung cancer Mother   . Bipolar disorder Father     Social History   Socioeconomic History  . Marital status: Significant Other    Spouse name: Not on file  . Number of children: Not on file  . Years of education: Not on file  . Highest education level: Not on file  Occupational History  . Not on file  Tobacco Use  . Smoking status: Former Research scientist (life sciences)  . Smokeless tobacco: Never Used  Vaping Use  . Vaping Use: Never used  Substance and Sexual Activity  . Alcohol use: Yes    Alcohol/week: 1.0 standard drink    Types: 1 Shots of liquor per week    Comment: occasional   . Drug use: No  . Sexual activity: Yes    Birth control/protection: None    Comment: female partner  Other Topics Concern  . Not on file  Social History Narrative  . Not on file   Social Determinants of Health   Financial Resource Strain: Not on file  Food Insecurity: Not on file  Transportation Needs: Not on file  Physical Activity: Not on file  Stress: Not on file  Social Connections: Not on file  Intimate Partner Violence: Not on file    Outpatient Medications Prior to Visit  Medication Sig Dispense Refill  . ASHWAGANDHA PO Take 1 tablet by mouth daily.    [name] atenolol (TENORMIN) 25 MG tablet Take 1 tablet (25 mg total) by mouth 2 (  two) times daily. 60 tablet 2  . Blood Glucose Monitoring Suppl (TRUE METRIX METER) w/Device KIT 1 Device by Does not apply route 4 (four) times daily. 1 kit 0  . dicyclomine (BENTYL) 20 MG tablet Take 1 tablet (20 mg total) by mouth 4 (four) times daily -  before meals and at bedtime. 20 tablet 0  . glucose blood (TRUE METRIX BLOOD GLUCOSE TEST) test strip Use as instructed 100 each 12  . Insulin Glargine (BASAGLAR KWIKPEN) 100 UNIT/ML Inject 15 Units into the skin at bedtime.     . Insulin Pen Needle 31G X 5 MM MISC 1 Device by Does not apply  route QID. For use with insulin pens 100 each 0  . lisinopril (ZESTRIL) 5 MG tablet Take 5 mg by mouth daily.     . metoCLOPramide (REGLAN) 5 MG tablet Take 1 tablet (5 mg total) by mouth 3 (three) times daily before meals. 90 tablet 2  . Multiple Vitamin (MULTIVITAMIN WITH MINERALS) TABS tablet Take 1 tablet by mouth daily.    . norethindrone-ethinyl estradiol (LOESTRIN) 1-20 MG-MCG tablet Take 1 tablet by mouth daily.    . pantoprazole (PROTONIX) 40 MG tablet Take 1 tablet (40 mg total) by mouth daily. 30 tablet 0  . insulin lispro (HUMALOG) 100 UNIT/ML injection Inject 0.05 mLs (5 Units total) into the skin 3 (three) times daily with meals. 10 mL 5  . mupirocin ointment (BACTROBAN) 2 % Apply 1 application topically 2 (two) times daily. (Patient not taking: No sig reported) 30 g 2  . ondansetron (ZOFRAN ODT) 4 MG disintegrating tablet Take 1 tablet (4 mg total) by mouth every 8 (eight) hours as needed for nausea or vomiting. (Patient not taking: No sig reported) 20 tablet 0  . TRUEPLUS LANCETS 28G MISC assist with checking blood sugar TID and qhs (Patient not taking: No sig reported) 100 each 3   No facility-administered medications prior to visit.    No Known Allergies  ROS Review of Systems  Constitutional: Negative.   HENT: Negative.   Respiratory: Negative.   Cardiovascular: Negative.   Endocrine: Negative.  Negative for polyuria.  Genitourinary: Negative.   Musculoskeletal: Negative.   Skin: Negative.   Allergic/Immunologic: Negative.   Neurological: Negative.   Hematological: Negative.   Psychiatric/Behavioral: Negative.   All other systems reviewed and are negative.     Objective:    Physical Exam Constitutional:      Appearance: Normal appearance. She is normal weight.  Eyes:     Pupils: Pupils are equal, round, and reactive to light.  Cardiovascular:     Rate and Rhythm: Normal rate and regular rhythm.     Pulses: Normal pulses.     Heart sounds: Normal heart  sounds.  Pulmonary:     Effort: Pulmonary effort is normal.     Breath sounds: Normal breath sounds.  Abdominal:     General: Abdomen is flat. Bowel sounds are normal.     Palpations: Abdomen is soft.  Musculoskeletal:        General: Normal range of motion.     Cervical back: Normal range of motion and neck supple.  Skin:    General: Skin is warm and dry.  Neurological:     General: No focal deficit present.     Mental Status: She is alert and oriented to person, place, and time.  Psychiatric:        Mood and Affect: Mood normal.        Behavior:  Behavior normal.     BP 115/68   Pulse 93   Temp 98.2 F (36.8 C) (Temporal)   Ht 5' 8"  (1.727 m)   Wt 154 lb 12.8 oz (70.2 kg)   SpO2 96%   BMI 23.54 kg/m  Wt Readings from Last 3 Encounters:  03/07/20 154 lb 12.8 oz (70.2 kg)  02/27/20 161 lb (73 kg)  02/24/20 161 lb (73 kg)     Health Maintenance Due  Topic Date Due  . Hepatitis C Screening  Never done  . PNEUMOCOCCAL POLYSACCHARIDE VACCINE AGE 88-64 HIGH RISK  Never done  . FOOT EXAM  Never done  . OPHTHALMOLOGY EXAM  Never done  . PAP-Cervical Cytology Screening  Never done  . PAP SMEAR-Modifier  Never done  . INFLUENZA VACCINE  Never done    There are no preventive care reminders to display for this patient.  Lab Results  Component Value Date   TSH 0.882 02/26/2020   Lab Results  Component Value Date   WBC 6.8 02/27/2020   HGB 12.6 02/27/2020   HCT 38.2 02/27/2020   MCV 88.4 02/27/2020   PLT 322 02/27/2020   Lab Results  Component Value Date   NA 138 02/27/2020   K 3.3 (L) 02/27/2020   CO2 23 02/27/2020   GLUCOSE 238 (H) 02/27/2020   BUN 13 02/27/2020   CREATININE 0.80 02/27/2020   BILITOT 1.2 02/27/2020   ALKPHOS 60 02/27/2020   AST 22 02/27/2020   ALT 19 02/27/2020   PROT 6.6 02/27/2020   ALBUMIN 3.0 (L) 02/27/2020   CALCIUM 9.3 02/27/2020   ANIONGAP 15 02/27/2020   GFR 106.35 07/05/2019   Lab Results  Component Value Date   CHOL 165  07/05/2019   Lab Results  Component Value Date   HDL 53.10 07/05/2019   Lab Results  Component Value Date   LDLCALC 75 07/05/2019   Lab Results  Component Value Date   TRIG 186.0 (H) 07/05/2019   Lab Results  Component Value Date   CHOLHDL 3 07/05/2019   Lab Results  Component Value Date   HGBA1C 10.8 (H) 02/24/2020      Assessment & Plan:   Problem List Items Addressed This Visit    Uncontrolled diabetes mellitus ([place]) - Primary   Relevant Medications   insulin lispro (HUMALOG) 100 UNIT/ML injection   Other Relevant Orders   Ambulatory referral to Endocrinology   Essential hypertension   Relevant Orders   Ambulatory referral to Endocrinology    Other Visit Diagnoses    Weight loss          Meds ordered this encounter  Medications  . feeding supplement, ENSURE COMPLETE, (ENSURE COMPLETE) LIQD    Sig: Take 237 mLs by mouth 2 (two) times daily between meals.    Dispense:  14220 mL    Refill:  3  . insulin lispro (HUMALOG) 100 UNIT/ML injection    Sig: Inject 0.05 mLs (5 Units total) into the skin 3 (three) times daily with meals.    Dispense:  10 mL    Refill:  5    Follow-up: Referral placed to Endocrinology. Continue seeing the dietician. Ensure twice a day between meals to aid in weight increase. Follow-up here in 3 months.    Kennyth Arnold, FNP

## 2020-03-07 NOTE — Patient Instructions (Signed)
High-Protein and High-Calorie Diet Eating high-protein and high-calorie foods can help you to gain weight, heal after an injury, and recover after an illness or surgery. The specific amount of daily protein and calories you need depends on:  Your body weight.  The reason this diet is recommended for you. What is my plan? Generally, a high-protein, high-calorie diet involves:  Eating 250-500 extra calories each day.  Making sure that you get enough of your daily calories from protein. Ask your health care provider how many of your calories should come from protein. Talk with a health care provider, such as a diet and nutrition specialist (dietitian), about how much protein and how many calories you need each day. Follow the diet as directed by your health care provider. What are tips for following this plan? Preparing meals  Add whole milk, half-and-half, or heavy cream to cereal, pudding, soup, or hot cocoa.  Add whole milk to instant breakfast drinks.  Add peanut butter to oatmeal or smoothies.  Add powdered milk to baked goods, smoothies, or milkshakes.  Add powdered milk, cream, or butter to mashed potatoes.  Add cheese to cooked vegetables.  Make whole-milk yogurt parfaits. Top them with granola, fruit, or nuts.  Add cottage cheese to your fruit.  Add avocado, cheese, or both to sandwiches or salads.  Add meat, poultry, or seafood to rice, pasta, casseroles, salads, and soups.  Use mayonnaise when making egg salad, chicken salad, or tuna salad.  Use peanut butter as a dip for vegetables or as a topping for pretzels, celery, or crackers.  Add beans to casseroles, dips, and spreads.  Add pureed beans to sauces and soups.  Replace calorie-free drinks with calorie-containing drinks, such as milk and fruit juice.  Replace water with milk or heavy cream when making foods such as oatmeal, pudding, or cocoa. General instructions  Ask your health care provider if you  should take a nutritional supplement.  Try to eat six small meals each day instead of three large meals.  Eat a balanced diet. In each meal, include one food that is high in protein.  Keep nutritious snacks available, such as nuts, trail mixes, dried fruit, and yogurt.  If you have kidney disease or diabetes, talk with your health care provider about how much protein is safe for you. Too much protein may put extra stress on your kidneys.  Drink your calories. Choose high-calorie drinks and have them after your meals.   What high-protein foods should I eat? Vegetables Soybeans. Peas. Grains Quinoa. Bulgur wheat. Meats and other proteins Beef, pork, and poultry. Fish and seafood. Eggs. Tofu. Textured vegetable protein (TVP). Peanut butter. Nuts and seeds. Dried beans. Protein powders. Dairy Whole milk. Whole-milk yogurt. Powdered milk. Cheese. Cottage Cheese. Eggnog. Beverages High-protein supplement drinks. Soy milk. Other foods Protein bars. The items listed above may not be a complete list of high-protein foods and beverages. Contact a dietitian for more options.   What high-calorie foods should I eat? Fruits Dried fruit. Fruit leather. Canned fruit in syrup. Fruit juice. Avocado. Vegetables Vegetables cooked in oil or butter. Fried potatoes. Grains Pasta. Quick breads. Muffins. Pancakes. Ready-to-eat cereal. Meats and other proteins Peanut butter. Nuts and seeds. Dairy Heavy cream. Whipped cream. Cream cheese. Sour cream. Ice cream. Custard. Pudding. Beverages Meal-replacement beverages. Nutrition shakes. Fruit juice. Sugar-sweetened soft drinks. Seasonings and condiments Salad dressing. Mayonnaise. Alfredo sauce. Fruit preserves or jelly. Honey. Syrup. Sweets and desserts Cake. Cookies. Pie. Pastries. Candy bars. Chocolate. Fats and oils Butter   or margarine. Oil. Gravy. Other foods Meal-replacement bars. The items listed above may not be a complete list of  high-calorie foods and beverages. Contact a dietitian for more options. Summary  A high-protein, high-calorie diet can help you gain weight or heal faster after an injury, illness, or surgery.  To increase your protein and calories, add ingredients such as whole milk, peanut butter, cheese, beans, meat, or seafood to meal items.  To get enough extra calories each day, include high-calorie foods and beverages at each meal.  Adding a high-calorie drink or shake can be an easy way to help you get enough calories each day. Talk with your healthcare provider or dietitian about the best options for you. This information is not intended to replace advice given to you by your health care provider. Make sure you discuss any questions you have with your health care provider. Document Revised: 01/16/2017 Document Reviewed: 12/16/2016 Elsevier Patient Education  2021 Elsevier Inc.  

## 2020-03-08 ENCOUNTER — Ambulatory Visit: Payer: 59 | Admitting: Podiatry

## 2020-03-14 ENCOUNTER — Ambulatory Visit (HOSPITAL_COMMUNITY): Admission: RE | Admit: 2020-03-14 | Payer: 59 | Source: Ambulatory Visit

## 2020-03-15 ENCOUNTER — Other Ambulatory Visit: Payer: Self-pay | Admitting: Family

## 2020-03-15 ENCOUNTER — Ambulatory Visit
Admission: RE | Admit: 2020-03-15 | Discharge: 2020-03-15 | Disposition: A | Discharge status: Home / Self Care | Payer: 59 | Source: Ambulatory Visit | Attending: Family | Admitting: Family

## 2020-03-15 DIAGNOSIS — E041 Nontoxic single thyroid nodule: Secondary | ICD-10-CM

## 2020-03-15 DIAGNOSIS — E049 Nontoxic goiter, unspecified: Secondary | ICD-10-CM

## 2020-03-22 ENCOUNTER — Encounter: Payer: Self-pay | Admitting: Internal Medicine

## 2020-03-27 LAB — HM PAP SMEAR: HM Pap smear: NORMAL

## 2020-04-03 ENCOUNTER — Other Ambulatory Visit: Payer: Self-pay

## 2020-04-03 ENCOUNTER — Observation Stay (HOSPITAL_COMMUNITY)
Admission: EM | Admit: 2020-04-03 | Discharge: 2020-04-06 | Disposition: A | Discharge status: Home / Self Care | Payer: 59 | Attending: Internal Medicine | Admitting: Internal Medicine

## 2020-04-03 ENCOUNTER — Encounter (HOSPITAL_COMMUNITY): Payer: Self-pay

## 2020-04-03 DIAGNOSIS — K3184 Gastroparesis: Secondary | ICD-10-CM

## 2020-04-03 DIAGNOSIS — R112 Nausea with vomiting, unspecified: Secondary | ICD-10-CM | POA: Diagnosis present

## 2020-04-03 DIAGNOSIS — I10 Essential (primary) hypertension: Secondary | ICD-10-CM | POA: Diagnosis not present

## 2020-04-03 DIAGNOSIS — Z87891 Personal history of nicotine dependence: Secondary | ICD-10-CM | POA: Diagnosis not present

## 2020-04-03 DIAGNOSIS — Z20822 Contact with and (suspected) exposure to covid-19: Secondary | ICD-10-CM | POA: Diagnosis not present

## 2020-04-03 DIAGNOSIS — E860 Dehydration: Secondary | ICD-10-CM | POA: Insufficient documentation

## 2020-04-03 DIAGNOSIS — Z79899 Other long term (current) drug therapy: Secondary | ICD-10-CM | POA: Insufficient documentation

## 2020-04-03 DIAGNOSIS — E1043 Type 1 diabetes mellitus with diabetic autonomic (poly)neuropathy: Principal | ICD-10-CM | POA: Insufficient documentation

## 2020-04-03 DIAGNOSIS — Z794 Long term (current) use of insulin: Secondary | ICD-10-CM | POA: Insufficient documentation

## 2020-04-03 HISTORY — DX: Type 2 diabetes mellitus without complications: E11.9

## 2020-04-03 HISTORY — DX: Type 2 diabetes mellitus with diabetic autonomic (poly)neuropathy: E11.43

## 2020-04-03 HISTORY — DX: Type 2 diabetes mellitus with diabetic autonomic (poly)neuropathy: K31.84

## 2020-04-03 MED ORDER — METOCLOPRAMIDE HCL 5 MG/ML IJ SOLN
10.0000 mg | Freq: Once | INTRAMUSCULAR | Status: AC
  Administered 2020-04-04: 10 mg via INTRAVENOUS
  Filled 2020-04-03: qty 2

## 2020-04-03 MED ORDER — LACTATED RINGERS IV BOLUS
1000.0000 mL | Freq: Once | INTRAVENOUS | Status: AC
  Administered 2020-04-04: 1000 mL via INTRAVENOUS

## 2020-04-03 MED ORDER — HYDROMORPHONE HCL 1 MG/ML IJ SOLN
1.0000 mg | Freq: Once | INTRAMUSCULAR | Status: AC
  Administered 2020-04-04: 1 mg via INTRAVENOUS
  Filled 2020-04-03: qty 1

## 2020-04-03 NOTE — ED Triage Notes (Signed)
Pt arrived via [facility] from home. Pt is complaining of abdominal pain across her whole abdomen. Pt has a hx of gastroparesis and diabetes. Pt reports sharp, stabbing pain that started this morning with vomiting.   20G LAC  EMS gave 350 mL NS 4mg  zofran 100mg  fentanyl  BP:160/108 HR: 120 RR: 22 O2: 98% RA CBG 212

## 2020-04-03 NOTE — ED Provider Notes (Signed)
[facility] DEPT Provider Note: Georgena Spurling, MD, FACEP  CSN: [id number] MRN: [id number] ARRIVAL: 04/03/20 at 2313 ROOM: WA09/WA09   CHIEF COMPLAINT  Abdominal Pain and Vomiting   HISTORY OF PRESENT ILLNESS  04/03/20 11:51 PM Kelli Hudson is a 30 y.o. female with insulin-dependent diabetes and diabetic gastroparesis.  She is here with nausea and vomiting that began this morning.  She states she has been vomiting all day.  She is also having sharp and stabbing but diffuse abdominal pain which she rates as a 10 out of 10.  She describes symptoms as being like previous episodes of diabetic gastroparesis.  Her abdominal pain is worse with movement or palpation.  She was given IV Zofran by EMS prior to arrival without improvement.  EMS reports a CBG of 212 prior to arrival.   Past Medical History:  Diagnosis Date  . Diabetes mellitus ([place])   . Diabetic gastroparesis ([place])   . Hypertension     Past Surgical History:  Procedure Laterality Date  . AMPUTATION TOE Left 03/10/2018   Procedure: AMPUTATION FIFTH TOE;  Surgeon: Trula Slade, DPM;  Location: [facility];  Service: Podiatry;  Laterality: Left;  . BIOPSY  01/28/2020   Procedure: BIOPSY;  Surgeon: Otis Brace, MD;  Location: [facility];  Service: Gastroenterology;;  . ESOPHAGOGASTRODUODENOSCOPY N/A 01/28/2020   Procedure: ESOPHAGOGASTRODUODENOSCOPY (EGD);  Surgeon: Otis Brace, MD;  Location: [facility];  Service: Gastroenterology;  Laterality: N/A;  . ESOPHAGOGASTRODUODENOSCOPY (EGD) WITH PROPOFOL Left 09/08/2015   Procedure: ESOPHAGOGASTRODUODENOSCOPY (EGD) WITH PROPOFOL;  Surgeon: Arta Silence, MD;  Location: [facility];  Service: Endoscopy;  Laterality: Left;  . WISDOM TOOTH EXTRACTION      Family History  Problem Relation Age of Onset  . Lung cancer Mother   . Bipolar disorder Father     Social History   Tobacco Use  . Smoking status: Former Research scientist (life sciences)  . Smokeless  tobacco: Never Used  Vaping Use  . Vaping Use: Never used  Substance Use Topics  . Alcohol use: Yes    Alcohol/week: 1.0 standard drink    Types: 1 Shots of liquor per week    Comment: occasional   . Drug use: No    Prior to Admission medications   Medication Sig Start Date End Date Taking? Authorizing Provider  ASHWAGANDHA PO Take 1 tablet by mouth daily.   Yes [provider]  atenolol (TENORMIN) 25 MG tablet Take 1 tablet (25 mg total) by mouth 2 (two) times daily. 01/30/20  Yes Mariel Aloe, MD  dicyclomine (BENTYL) 20 MG tablet Take 1 tablet (20 mg total) by mouth 4 (four) times daily -  before meals and at bedtime. 02/28/20  Yes Long, Wonda Olds, MD  feeding supplement, ENSURE COMPLETE, (ENSURE COMPLETE) LIQD Take 237 mLs by mouth 2 (two) times daily between meals. 03/07/20  Yes Dutch Quint B, FNP  Insulin Glargine (BASAGLAR KWIKPEN) 100 UNIT/ML Inject 15 Units into the skin at bedtime.  12/24/19  Yes [provider]  insulin lispro (HUMALOG) 100 UNIT/ML injection Inject 0.05 mLs (5 Units total) into the skin 3 (three) times daily with meals. 03/07/20  Yes Dutch Quint B, FNP  lisinopril (ZESTRIL) 5 MG tablet Take 5 mg by mouth daily.  10/17/19  Yes [provider]  metoCLOPramide (REGLAN) 5 MG tablet Take 1 tablet (5 mg total) by mouth 3 (three) times daily before meals. 01/30/20  Yes Mariel Aloe, MD  Multiple Vitamin (MULTIVITAMIN WITH MINERALS) TABS tablet Take  1 tablet by mouth daily.   Yes [provider]  norethindrone-ethinyl estradiol (LOESTRIN) 1-20 MG-MCG tablet Take 1 tablet by mouth daily.   Yes [provider]  pantoprazole (PROTONIX) 40 MG tablet Take 1 tablet (40 mg total) by mouth daily. 01/30/20  Yes Mariel Aloe, MD  Blood Glucose Monitoring Suppl (TRUE METRIX METER) w/Device KIT 1 Device by Does not apply route 4 (four) times daily. 07/05/19   Libby Maw, MD  glucose blood (TRUE METRIX BLOOD GLUCOSE TEST)  test strip Use as instructed 07/05/19   Libby Maw, MD  Insulin Pen Needle 31G X 5 MM MISC 1 Device by Does not apply route QID. For use with insulin pens 07/05/19   Libby Maw, MD  TRUEPLUS LANCETS 28G MISC assist with checking blood sugar TID and qhs Patient not taking: No sig reported 09/10/15   Brayton Caves, PA-C  insulin aspart (NOVOLOG) 100 UNIT/ML FlexPen Inject 5 Units into the skin 3 (three) times daily with meals. 02/23/18 07/05/19  Donne Hazel, MD  promethazine (PHENERGAN) 25 MG tablet Take 1 tablet (25 mg total) by mouth every 8 (eight) hours as needed for nausea or vomiting. Patient not taking: Reported on 07/05/2019 03/10/18 10/04/19  Trula Slade, DPM    Allergies Patient has no known allergies.   REVIEW OF SYSTEMS  Negative except as noted here or in the History of Present Illness.   PHYSICAL EXAMINATION  Initial Vital Signs Blood pressure (!) 158/108, pulse (!) 113, temperature 97.7 F (36.5 C), resp. rate 15, height 5' 8"  (1.727 m), weight 72.6 kg, SpO2 100 %.  Examination General: Well-developed, well-nourished female in no acute distress; appears much older than age of record HENT: normocephalic; atraumatic Eyes: pupils equal, round and reactive to light; extraocular muscles intact Neck: supple Heart: regular rate and rhythm; tachycardia Lungs: clear to auscultation bilaterally Abdomen: soft; nondistended; diffusely tender; bowel sounds present Extremities: No deformity; full range of motion; pulses normal Neurologic: Awake, alert; motor function intact in all extremities and symmetric; no facial droop Skin: Warm and dry Psychiatric: Grimacing   RESULTS  Summary of this visit's results, reviewed and interpreted by myself:   EKG Interpretation  Date/Time:  Wednesday April 04 2020 00:11:45 EST Ventricular Rate:  114 PR Interval:    QRS Duration: 80 QT Interval:  293 QTC Calculation: 404 R Axis:   64 Text  Interpretation: Sinus tachycardia Consider right atrial enlargement No significant change was found Confirmed by Shanon Rosser [phone]) on 04/04/2020 12:16:09 AM      Laboratory Studies: Results for orders placed or performed during the hospital encounter of 04/03/20 (from the past 24 hour(s))  CBG monitoring, ED     Status: Abnormal   Collection Time: 04/04/20 12:14 AM  Result Value Ref Range   Glucose-Capillary 191 (H) 70 - 99 mg/dL  CBC with Differential/Platelet     Status: Abnormal   Collection Time: 04/04/20 12:15 AM  Result Value Ref Range   WBC 11.1 (H) 4.0 - 10.5 K/uL   RBC 4.00 3.87 - 5.11 MIL/uL   Hemoglobin 12.0 12.0 - 15.0 g/dL   HCT 35.3 (L) 36.0 - 46.0 %   MCV 88.3 80.0 - 100.0 fL   MCH 30.0 26.0 - 34.0 pg   MCHC 34.0 30.0 - 36.0 g/dL   RDW 12.7 11.5 - 15.5 %   Platelets 367 150 - 400 K/uL   nRBC 0.0 0.0 - 0.2 %   Neutrophils Relative % 82 %  Neutro Abs 9.1 (H) 1.7 - 7.7 K/uL   Lymphocytes Relative 15 %   Lymphs Abs 1.6 0.7 - 4.0 K/uL   Monocytes Relative 3 %   Monocytes Absolute 0.3 0.1 - 1.0 K/uL   Eosinophils Relative 0 %   Eosinophils Absolute 0.0 0.0 - 0.5 K/uL   Basophils Relative 0 %   Basophils Absolute 0.0 0.0 - 0.1 K/uL   Immature Granulocytes 0 %   Abs Immature Granulocytes 0.04 0.00 - 0.07 K/uL  Comprehensive metabolic panel     Status: Abnormal   Collection Time: 04/04/20 12:15 AM  Result Value Ref Range   Sodium 139 135 - 145 mmol/L   Potassium 4.3 3.5 - 5.1 mmol/L   Chloride 101 98 - 111 mmol/L   CO2 24 22 - 32 mmol/L   Glucose, Bld 206 (H) 70 - 99 mg/dL   BUN 15 6 - 20 mg/dL   Creatinine, Ser 0.76 0.44 - 1.00 mg/dL   Calcium 9.9 8.9 - 10.3 mg/dL   Total Protein 7.7 6.5 - 8.1 g/dL   Albumin 3.5 3.5 - 5.0 g/dL   AST 34 15 - 41 U/L   ALT 28 0 - 44 U/L   Alkaline Phosphatase 64 38 - 126 U/L   Total Bilirubin 1.4 (H) 0.3 - 1.2 mg/dL   GFR, Estimated >60 >60 mL/min   Anion gap 14 5 - 15  Lipase, blood     Status: None   Collection Time:  04/04/20 12:15 AM  Result Value Ref Range   Lipase 20 11 - 51 U/L  Blood gas, venous (at [facility] and [facility], not at [facility])     Status: Abnormal   Collection Time: 04/04/20 12:15 AM  Result Value Ref Range   pH, Ven 7.440 (H) 7.250 - 7.430   pCO2, Ven 36.9 (L) 44.0 - 60.0 mmHg   pO2, Ven 37.3 32.0 - 45.0 mmHg   Bicarbonate 24.7 20.0 - 28.0 mmol/L   Acid-Base Excess 1.2 0.0 - 2.0 mmol/L   O2 Saturation 70.7 %   Patient temperature 98.6   I-Stat Beta hCG blood, ED ([facility], [facility], [facility] only)     Status: Abnormal   Collection Time: 04/04/20 12:25 AM  Result Value Ref Range   I-stat hCG, quantitative 6.1 (H) <5 mIU/mL   Comment 3          Urinalysis, Routine w reflex microscopic Urine, Clean Catch     Status: Abnormal   Collection Time: 04/04/20  1:30 AM  Result Value Ref Range   Color, Urine YELLOW YELLOW   APPearance HAZY (A) CLEAR   Specific Gravity, Urine 1.013 1.005 - 1.030   pH 7.0 5.0 - 8.0   Glucose, UA 150 (A) NEGATIVE mg/dL   Hgb urine dipstick SMALL (A) NEGATIVE   Bilirubin Urine NEGATIVE NEGATIVE   Ketones, ur 20 (A) NEGATIVE mg/dL   Protein, ur 100 (A) NEGATIVE mg/dL   Nitrite NEGATIVE NEGATIVE   Leukocytes,Ua NEGATIVE NEGATIVE   RBC / HPF 0-5 0 - 5 RBC/hpf   WBC, UA 0-5 0 - 5 WBC/hpf   Bacteria, UA FEW (A) NONE SEEN   Squamous Epithelial / LPF 6-10 0 - 5   Mucus PRESENT    Hyaline Casts, UA PRESENT   Rapid urine drug screen (hospital performed)     Status: None   Collection Time: 04/04/20  1:30 AM  Result Value Ref Range   Opiates NONE DETECTED NONE DETECTED   Cocaine NONE DETECTED NONE DETECTED  Benzodiazepines NONE DETECTED NONE DETECTED   Amphetamines NONE DETECTED NONE DETECTED   Tetrahydrocannabinol NONE DETECTED NONE DETECTED   Barbiturates NONE DETECTED NONE DETECTED   Imaging Studies: No results found.  ED COURSE and MDM  Nursing notes, initial and subsequent vitals signs, including pulse oximetry, reviewed and interpreted by myself.  Vitals:   04/04/20 0000  04/04/20 0100 04/04/20 0201 04/04/20 0300  BP: (!) 167/111 (!) 174/122 (!) 155/117 (!) 169/117  Pulse: (!) 111 (!) 114 (!) 120 (!) 111  Resp: 13 14 19 13   Temp:      TempSrc:      SpO2: 100% 99% 99% 100%  Weight:      Height:       Medications  HYDROmorphone (DILAUDID) injection 1 mg (has no administration in time range)  prochlorperazine (COMPAZINE) injection 5 mg (has no administration in time range)  lactated ringers bolus 1,000 mL (0 mLs Intravenous Stopped 04/04/20 0155)  metoCLOPramide (REGLAN) injection 10 mg (10 mg Intravenous Given 04/04/20 0015)  HYDROmorphone (DILAUDID) injection 1 mg (1 mg Intravenous Given 04/04/20 0016)   4:18 AM Patient feeling better but still unable to hold fluids down.  States her pain and nausea are still present and she usually needs to be admitted when she gets such an episode.   PROCEDURES  Procedures   ED DIAGNOSES     ICD-10-CM   1. 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 Dehydration  E86.0        Shareece Bultman, Jenny Reichmann, MD 04/07/20 2235

## 2020-04-04 DIAGNOSIS — R112 Nausea with vomiting, unspecified: Secondary | ICD-10-CM | POA: Diagnosis not present

## 2020-04-04 LAB — CBC
HCT: 32.4 % — ABNORMAL LOW (ref 36.0–46.0)
Hemoglobin: 10.5 g/dL — ABNORMAL LOW (ref 12.0–15.0)
MCH: 28.8 pg (ref 26.0–34.0)
MCHC: 32.4 g/dL (ref 30.0–36.0)
MCV: 89 fL (ref 80.0–100.0)
Platelets: 306 10*3/uL (ref 150–400)
RBC: 3.64 MIL/uL — ABNORMAL LOW (ref 3.87–5.11)
RDW: 12.9 % (ref 11.5–15.5)
WBC: 12.4 10*3/uL — ABNORMAL HIGH (ref 4.0–10.5)
nRBC: 0 % (ref 0.0–0.2)

## 2020-04-04 LAB — URINALYSIS, ROUTINE W REFLEX MICROSCOPIC
Bilirubin Urine: NEGATIVE
Glucose, UA: 150 mg/dL — AB
Ketones, ur: 20 mg/dL — AB
Leukocytes,Ua: NEGATIVE
Nitrite: NEGATIVE
Protein, ur: 100 mg/dL — AB
Specific Gravity, Urine: 1.013 (ref 1.005–1.030)
pH: 7 (ref 5.0–8.0)

## 2020-04-04 LAB — COMPREHENSIVE METABOLIC PANEL
ALT: 28 U/L (ref 0–44)
AST: 34 U/L (ref 15–41)
Albumin: 3.5 g/dL (ref 3.5–5.0)
Alkaline Phosphatase: 64 U/L (ref 38–126)
Anion gap: 14 (ref 5–15)
BUN: 15 mg/dL (ref 6–20)
CO2: 24 mmol/L (ref 22–32)
Calcium: 9.9 mg/dL (ref 8.9–10.3)
Chloride: 101 mmol/L (ref 98–111)
Creatinine, Ser: 0.76 mg/dL (ref 0.44–1.00)
GFR, Estimated: >60 mL/min (ref >60)
Glucose, Bld: 206 mg/dL — ABNORMAL HIGH (ref 70–99)
Potassium: 4.3 mmol/L (ref 3.5–5.1)
Sodium: 139 mmol/L (ref 135–145)
Total Bilirubin: 1.4 mg/dL — ABNORMAL HIGH (ref 0.3–1.2)
Total Protein: 7.7 g/dL (ref 6.5–8.1)

## 2020-04-04 LAB — RAPID URINE DRUG SCREEN, HOSP PERFORMED
Amphetamines: NOT DETECTED
Barbiturates: NOT DETECTED
Benzodiazepines: NOT DETECTED
Cocaine: NOT DETECTED
Opiates: NOT DETECTED
Tetrahydrocannabinol: NOT DETECTED

## 2020-04-04 LAB — BLOOD GAS, VENOUS
Acid-Base Excess: 1.2 mmol/L (ref 0.0–2.0)
Bicarbonate: 24.7 mmol/L (ref 20.0–28.0)
O2 Saturation: 70.7 %
Patient temperature: 98.6
pCO2, Ven: 36.9 mmHg — ABNORMAL LOW (ref 44.0–60.0)
pH, Ven: 7.44 — ABNORMAL HIGH (ref 7.250–7.430)
pO2, Ven: 37.3 mmHg (ref 32.0–45.0)

## 2020-04-04 LAB — CBC WITH DIFFERENTIAL/PLATELET
Abs Immature Granulocytes: 0.04 10*3/uL (ref 0.00–0.07)
Basophils Absolute: 0 10*3/uL (ref 0.0–0.1)
Basophils Relative: 0 %
Eosinophils Absolute: 0 10*3/uL (ref 0.0–0.5)
Eosinophils Relative: 0 %
HCT: 35.3 % — ABNORMAL LOW (ref 36.0–46.0)
Hemoglobin: 12 g/dL (ref 12.0–15.0)
Immature Granulocytes: 0 %
Lymphocytes Relative: 15 %
Lymphs Abs: 1.6 10*3/uL (ref 0.7–4.0)
MCH: 30 pg (ref 26.0–34.0)
MCHC: 34 g/dL (ref 30.0–36.0)
MCV: 88.3 fL (ref 80.0–100.0)
Monocytes Absolute: 0.3 10*3/uL (ref 0.1–1.0)
Monocytes Relative: 3 %
Neutro Abs: 9.1 10*3/uL — ABNORMAL HIGH (ref 1.7–7.7)
Neutrophils Relative %: 82 %
Platelets: 367 10*3/uL (ref 150–400)
RBC: 4 MIL/uL (ref 3.87–5.11)
RDW: 12.7 % (ref 11.5–15.5)
WBC: 11.1 10*3/uL — ABNORMAL HIGH (ref 4.0–10.5)
nRBC: 0 % (ref 0.0–0.2)

## 2020-04-04 LAB — SARS CORONAVIRUS 2 (TAT 6-24 HRS): SARS Coronavirus 2: NEGATIVE

## 2020-04-04 LAB — CBG MONITORING, ED
Glucose-Capillary: 140 mg/dL — ABNORMAL HIGH (ref 70–99)
Glucose-Capillary: 146 mg/dL — ABNORMAL HIGH (ref 70–99)
Glucose-Capillary: 156 mg/dL — ABNORMAL HIGH (ref 70–99)
Glucose-Capillary: 191 mg/dL — ABNORMAL HIGH (ref 70–99)

## 2020-04-04 LAB — GLUCOSE, CAPILLARY: Glucose-Capillary: 182 mg/dL — ABNORMAL HIGH (ref 70–99)

## 2020-04-04 LAB — CREATININE, SERUM
Creatinine, Ser: 0.82 mg/dL (ref 0.44–1.00)
GFR, Estimated: >60 mL/min (ref >60)

## 2020-04-04 LAB — I-STAT BETA HCG BLOOD, ED (MC, WL, AP ONLY): I-stat hCG, quantitative: 6.1 m[IU]/mL — ABNORMAL HIGH (ref <5)

## 2020-04-04 LAB — LIPASE, BLOOD: Lipase: 20 U/L (ref 11–51)

## 2020-04-04 LAB — HCG, SERUM, QUALITATIVE: Preg, Serum: NEGATIVE

## 2020-04-04 MED ORDER — PANTOPRAZOLE SODIUM 40 MG PO TBEC
40.0000 mg | DELAYED_RELEASE_TABLET | Freq: Every day | ORAL | Status: DC
  Administered 2020-04-04 – 2020-04-06 (×3): 40 mg via ORAL
  Filled 2020-04-04 (×3): qty 1

## 2020-04-04 MED ORDER — FOLIC ACID 1 MG PO TABS
1.0000 mg | ORAL_TABLET | Freq: Every day | ORAL | Status: DC
  Administered 2020-04-04 – 2020-04-06 (×3): 1 mg via ORAL
  Filled 2020-04-04 (×3): qty 1

## 2020-04-04 MED ORDER — DOCUSATE SODIUM 100 MG PO CAPS
100.0000 mg | ORAL_CAPSULE | Freq: Two times a day (BID) | ORAL | Status: DC
  Filled 2020-04-04 (×3): qty 1

## 2020-04-04 MED ORDER — ATENOLOL 25 MG PO TABS
25.0000 mg | ORAL_TABLET | Freq: Two times a day (BID) | ORAL | Status: DC
  Administered 2020-04-04 – 2020-04-06 (×4): 25 mg via ORAL
  Filled 2020-04-04 (×3): qty 1

## 2020-04-04 MED ORDER — HYDROMORPHONE HCL 1 MG/ML IJ SOLN
0.5000 mg | INTRAMUSCULAR | Status: DC | PRN
  Administered 2020-04-05 (×3): 0.5 mg via INTRAVENOUS
  Filled 2020-04-04 (×3): qty 0.5

## 2020-04-04 MED ORDER — MAGNESIUM HYDROXIDE 400 MG/5ML PO SUSP: 30.0000 mL | Freq: Every day | ORAL | Status: DC | PRN

## 2020-04-04 MED ORDER — ALUM & MAG HYDROXIDE-SIMETH 200-200-20 MG/5ML PO SUSP
30.0000 mL | Freq: Once | ORAL | Status: AC
  Administered 2020-04-04: 30 mL via ORAL
  Filled 2020-04-04: qty 30

## 2020-04-04 MED ORDER — METOCLOPRAMIDE HCL 5 MG/ML IJ SOLN
10.0000 mg | Freq: Three times a day (TID) | INTRAMUSCULAR | Status: AC
  Administered 2020-04-04 – 2020-04-05 (×3): 10 mg via INTRAVENOUS
  Filled 2020-04-04 (×3): qty 2

## 2020-04-04 MED ORDER — ENOXAPARIN SODIUM 40 MG/0.4ML ~~LOC~~ SOLN
40.0000 mg | SUBCUTANEOUS | Status: DC
  Administered 2020-04-04 – 2020-04-05 (×2): 40 mg via SUBCUTANEOUS
  Filled 2020-04-04 (×2): qty 0.4

## 2020-04-04 MED ORDER — ACETAMINOPHEN 325 MG PO TABS: 650.0000 mg | ORAL_TABLET | Freq: Four times a day (QID) | ORAL | Status: DC | PRN

## 2020-04-04 MED ORDER — LIDOCAINE VISCOUS HCL 2 % MT SOLN
15.0000 mL | Freq: Once | OROMUCOSAL | Status: AC
  Administered 2020-04-04: 15 mL via ORAL
  Filled 2020-04-04: qty 15

## 2020-04-04 MED ORDER — SODIUM CHLORIDE 0.9 % IV SOLN: INTRAVENOUS | Status: DC

## 2020-04-04 MED ORDER — ACETAMINOPHEN 650 MG RE SUPP: 650.0000 mg | Freq: Four times a day (QID) | RECTAL | Status: DC | PRN

## 2020-04-04 MED ORDER — LISINOPRIL 5 MG PO TABS
5.0000 mg | ORAL_TABLET | Freq: Every day | ORAL | Status: DC
  Administered 2020-04-04 – 2020-04-06 (×3): 5 mg via ORAL
  Filled 2020-04-04 (×3): qty 1

## 2020-04-04 MED ORDER — ATENOLOL 25 MG PO TABS
ORAL_TABLET | ORAL | Status: AC
  Administered 2020-04-04: 25 mg
  Filled 2020-04-04: qty 1

## 2020-04-04 MED ORDER — INSULIN ASPART 100 UNIT/ML ~~LOC~~ SOLN
0.0000 [IU] | Freq: Every day | SUBCUTANEOUS | Status: DC
  Filled 2020-04-04: qty 0.05

## 2020-04-04 MED ORDER — PROCHLORPERAZINE EDISYLATE 10 MG/2ML IJ SOLN
5.0000 mg | Freq: Once | INTRAMUSCULAR | Status: AC
  Administered 2020-04-04: 5 mg via INTRAVENOUS
  Filled 2020-04-04: qty 2

## 2020-04-04 MED ORDER — INSULIN GLARGINE 100 UNIT/ML ~~LOC~~ SOLN
10.0000 [IU] | Freq: Every day | SUBCUTANEOUS | Status: DC
  Administered 2020-04-04 – 2020-04-05 (×2): 10 [IU] via SUBCUTANEOUS
  Filled 2020-04-04 (×2): qty 0.1

## 2020-04-04 MED ORDER — HYDROMORPHONE HCL 1 MG/ML IJ SOLN
1.0000 mg | Freq: Once | INTRAMUSCULAR | Status: AC
  Administered 2020-04-04: 1 mg via INTRAVENOUS
  Filled 2020-04-04: qty 1

## 2020-04-04 MED ORDER — NORETHINDRONE ACET-ETHINYL EST 1-20 MG-MCG PO TABS: 1.0000 | ORAL_TABLET | Freq: Every day | ORAL | Status: DC

## 2020-04-04 MED ORDER — DICYCLOMINE HCL 20 MG PO TABS
20.0000 mg | ORAL_TABLET | Freq: Three times a day (TID) | ORAL | Status: DC
  Administered 2020-04-04 – 2020-04-06 (×7): 20 mg via ORAL
  Filled 2020-04-04 (×10): qty 1

## 2020-04-04 MED ORDER — BASAGLAR KWIKPEN 100 UNIT/ML ~~LOC~~ SOPN: 10.0000 [IU] | PEN_INJECTOR | Freq: Every day | SUBCUTANEOUS | Status: DC

## 2020-04-04 MED ORDER — INSULIN ASPART 100 UNIT/ML ~~LOC~~ SOLN
0.0000 [IU] | Freq: Three times a day (TID) | SUBCUTANEOUS | Status: DC
  Administered 2020-04-05 – 2020-04-06 (×2): 5 [IU] via SUBCUTANEOUS
  Filled 2020-04-04: qty 0.15

## 2020-04-04 MED ORDER — ADULT MULTIVITAMIN W/MINERALS CH
1.0000 | ORAL_TABLET | Freq: Every day | ORAL | Status: DC
  Administered 2020-04-04 – 2020-04-06 (×3): 1 via ORAL
  Filled 2020-04-04 (×3): qty 1

## 2020-04-04 NOTE — ED Notes (Signed)
RN is aware about blood pressure

## 2020-04-04 NOTE — Progress Notes (Signed)
Inpatient Diabetes Program Recommendations  AACE/ADA: New Consensus Statement on Inpatient Glycemic Control (2015)  Target Ranges:  Prepandial:   less than 140 mg/dL      Peak postprandial:   less than 180 mg/dL (1-2 hours)      Critically ill patients:  140 - 180 mg/dL   Lab Results  Component Value Date   GLUCAP 191 (H) 04/04/2020   HGBA1C 10.8 (H) 02/24/2020    Diabetes history:  T1DM Outpatient Diabetes medications:  Basaglar 15 units QHS Humalog 5 units TID Current orders for Inpatient glycemic control:  Novolog 0-15 units TID  Inpatient Diabetes Program Recommendations:    Please consider;  Lantus 12 units QHS Novolog 0-9 units TID  Once eating 3 units TID with meals if eats at least 50%  Will continue to follow while inpatient.  Thank you, Dulce Sellar, RN, BSN Diabetes Coordinator Inpatient Diabetes Program [phone] (team pager from 8a-5p)

## 2020-04-04 NOTE — ED Notes (Signed)
Admission MD at bedside.  

## 2020-04-04 NOTE — ED Notes (Signed)
Patient made aware of need for a urine sample.

## 2020-04-04 NOTE — H&P (Signed)
History and Physical    Sophy Mesler LEX:[id number] DOB: September 18, 1990 DOA: 04/03/2020  PCP: Libby Maw, MD  Patient coming from: Home  Chief Complaint: N/V/ab pain  HPI: Kelli Hudson is a 30 y.o. female with medical history significant of IDDM, diabetic gastroparesis, HTN. Presenting with N/V/ab pain. States her symptoms started yesterday morning. She tried to eat light foods to settle her stomach, but symptoms continued to progress. She checked her glucose and they were running mid-100's. She did not have a diet change. As the day continued, she was no longer able to keep solids or liquids. She tried zofran, but it did not help. She decided to come the the ED.    ED Course: Found to be tachycardic and hypertensive. She was given pain control, fluids, antiemetics. She was still unable to pass PO challenge. [facility] was called for admission.   Review of Systems:  Denies CP, palpitations, dyspnea, syncopal episodes, hematochezia, hematemesis. Reports epigastric burning. Review of systems is otherwise negative for all not mentioned in HPI.   PMHx Past Medical History:  Diagnosis Date  . Diabetes mellitus ([place])   . Diabetic gastroparesis ([place])   . Hypertension     PSHx Past Surgical History:  Procedure Laterality Date  . AMPUTATION TOE Left 03/10/2018   Procedure: AMPUTATION FIFTH TOE;  Surgeon: Trula Slade, DPM;  Location: [place];  Service: Podiatry;  Laterality: Left;  . BIOPSY  01/28/2020   Procedure: BIOPSY;  Surgeon: Otis Brace, MD;  Location: [facility];  Service: Gastroenterology;;  . ESOPHAGOGASTRODUODENOSCOPY N/A 01/28/2020   Procedure: ESOPHAGOGASTRODUODENOSCOPY (EGD);  Surgeon: Otis Brace, MD;  Location: [facility];  Service: Gastroenterology;  Laterality: N/A;  . ESOPHAGOGASTRODUODENOSCOPY (EGD) WITH PROPOFOL Left 09/08/2015   Procedure: ESOPHAGOGASTRODUODENOSCOPY (EGD) WITH PROPOFOL;  Surgeon: Arta Silence,  MD;  Location: [facility];  Service: Endoscopy;  Laterality: Left;  . WISDOM TOOTH EXTRACTION      SocHx  reports that she has quit smoking. She has never used smokeless tobacco. She reports current alcohol use of about 1.0 standard drink of alcohol per week. She reports that she does not use drugs.  No Known Allergies  FamHx Family History  Problem Relation Age of Onset  . Lung cancer Mother   . Bipolar disorder Father     Prior to Admission medications   Medication Sig Start Date End Date Taking? Authorizing Provider  ASHWAGANDHA PO Take 1 tablet by mouth daily.   Yes [provider]  atenolol (TENORMIN) 25 MG tablet Take 1 tablet (25 mg total) by mouth 2 (two) times daily. 01/30/20  Yes Mariel Aloe, MD  dicyclomine (BENTYL) 20 MG tablet Take 1 tablet (20 mg total) by mouth 4 (four) times daily -  before meals and at bedtime. 02/28/20  Yes Long, Wonda Olds, MD  feeding supplement, ENSURE COMPLETE, (ENSURE COMPLETE) LIQD Take 237 mLs by mouth 2 (two) times daily between meals. 03/07/20  Yes Dutch Quint B, FNP  Insulin Glargine (BASAGLAR KWIKPEN) 100 UNIT/ML Inject 15 Units into the skin at bedtime.  12/24/19  Yes [provider]  insulin lispro (HUMALOG) 100 UNIT/ML injection Inject 0.05 mLs (5 Units total) into the skin 3 (three) times daily with meals. 03/07/20  Yes Dutch Quint B, FNP  lisinopril (ZESTRIL) 5 MG tablet Take 5 mg by mouth daily.  10/17/19  Yes [provider]  metoCLOPramide (REGLAN) 5 MG tablet Take 1 tablet (5 mg total) by mouth 3 (three) times daily before meals. 01/30/20  Yes Mariel Aloe, MD  Multiple Vitamin (MULTIVITAMIN WITH MINERALS) TABS tablet Take 1 tablet by mouth daily.   Yes [provider]  norethindrone-ethinyl estradiol (LOESTRIN) 1-20 MG-MCG tablet Take 1 tablet by mouth daily.   Yes [provider]  pantoprazole (PROTONIX) 40 MG tablet Take 1 tablet (40 mg total) by mouth daily. 01/30/20  Yes Mariel Aloe, MD  Blood Glucose Monitoring Suppl (TRUE METRIX METER) w/Device KIT 1 Device by Does not apply route 4 (four) times daily. 07/05/19   Libby Maw, MD  glucose blood (TRUE METRIX BLOOD GLUCOSE TEST) test strip Use as instructed 07/05/19   Libby Maw, MD  Insulin Pen Needle 31G X 5 MM MISC 1 Device by Does not apply route QID. For use with insulin pens 07/05/19   Libby Maw, MD  TRUEPLUS LANCETS 28G MISC assist with checking blood sugar TID and qhs Patient not taking: No sig reported 09/10/15   Brayton Caves, PA-C  insulin aspart (NOVOLOG) 100 UNIT/ML FlexPen Inject 5 Units into the skin 3 (three) times daily with meals. 02/23/18 07/05/19  Donne Hazel, MD  promethazine (PHENERGAN) 25 MG tablet Take 1 tablet (25 mg total) by mouth every 8 (eight) hours as needed for nausea or vomiting. Patient not taking: Reported on 07/05/2019 03/10/18 10/04/19  Trula Slade, DPM    Physical Exam: Vitals:   04/04/20 0500 04/04/20 0600 04/04/20 0630 04/04/20 0700  BP: 106/68 100/70 110/77 110/78  Pulse: (!) 105 (!) 106 (!) 105 (!) 108  Resp: _0 (!) 8  Temp:      TempSrc:      SpO2: 99% 99% 100% 99%  Weight:      Height:        General: 30 y.o. female resting in bed in NAD Eyes: PERRL, normal sclera ENMT: Nares patent w/o discharge, orophaynx clear, dentition normal, ears w/o discharge/lesions/ulcers Neck: Supple, trachea midline Cardiovascular: tachy, +S1, S2, no m/g/r, equal pulses throughout Respiratory: CTABL, no w/r/r, normal WOB GI: BS+, ND, TTP epigastric and RUQ, no masses noted, no organomegaly noted MSK: No e/c/c Skin: No rashes, bruises, ulcerations noted Neuro: A&O x 3, no focal deficits Psyc: Appropriate interaction and affect, calm/cooperative  Labs on Admission: I have personally reviewed following labs and imaging studies  CBC: Recent Labs  Lab 04/04/20 0015  WBC 11.1*  NEUTROABS 9.1*  HGB 12.0  HCT 35.3*  MCV 88.3  PLT 053    Basic Metabolic Panel: Recent Labs  Lab 04/04/20 0015  NA 139  K 4.3  CL 101  CO2 24  GLUCOSE 206*  BUN 15  CREATININE 0.76  CALCIUM 9.9   GFR: Estimated Creatinine Clearance: 105.6 mL/min (by C-G formula based on SCr of 0.76 mg/dL). Liver Function Tests: Recent Labs  Lab 04/04/20 0015  AST 34  ALT 28  ALKPHOS 64  BILITOT 1.4*  PROT 7.7  ALBUMIN 3.5   Recent Labs  Lab 04/04/20 0015  LIPASE 20   No results for input(s): AMMONIA in the last 168 hours. Coagulation Profile: No results for input(s): INR, PROTIME in the last 168 hours. Cardiac Enzymes: No results for input(s): CKTOTAL, CKMB, CKMBINDEX, TROPONINI in the last 168 hours. BNP (last 3 results) No results for input(s): PROBNP in the last 8760 hours. HbA1C: No results for input(s): HGBA1C in the last 72 hours. CBG: Recent Labs  Lab 04/04/20 0014  GLUCAP 191*   Lipid Profile: No results for input(s): CHOL, HDL, LDLCALC,  TRIG, CHOLHDL, LDLDIRECT in the last 72 hours. Thyroid Function Tests: No results for input(s): TSH, T4TOTAL, FREET4, T3FREE, THYROIDAB in the last 72 hours. Anemia Panel: No results for input(s): VITAMINB12, FOLATE, FERRITIN, TIBC, IRON, RETICCTPCT in the last 72 hours. Urine analysis:    Component Value Date/Time   COLORURINE YELLOW 04/04/2020 0130   APPEARANCEUR HAZY (A) 04/04/2020 0130   LABSPEC 1.013 04/04/2020 0130   PHURINE 7.0 04/04/2020 0130   GLUCOSEU 150 (A) 04/04/2020 0130   GLUCOSEU >=1000 (A) 07/05/2019 1343   HGBUR SMALL (A) 04/04/2020 0130   BILIRUBINUR NEGATIVE 04/04/2020 0130   KETONESUR 20 (A) 04/04/2020 0130   PROTEINUR 100 (A) 04/04/2020 0130   UROBILINOGEN 0.2 07/05/2019 1343   NITRITE NEGATIVE 04/04/2020 0130   LEUKOCYTESUR NEGATIVE 04/04/2020 0130    Radiological Exams on Admission: No results found.  EKG: Independently reviewed. Sinus tach, no st elevations  Assessment/Plan Intractable N/V Abdominal pain Hx of PUD IDDM w/ gastroparesis      - place in obs, tele     - fluids, CLD (AAT'd)     - pain control, reglan     - SSI, lantus, glucose checks; A1c is 6.1     - add GI cocktail x 1, bentyl, protonix     - LFTs, lipase ok, follow     - needs a local GI doc, she's followed by [facility] PCP; will reach out to [facility]  HTN     - resume home atenolol, lisinopril  DVT prophylaxis: lovenox  Code Status: FULL  Family Communication: None at bedside.  Consults called: None   Status is: Observation  The patient remains OBS appropriate and will d/c before 2 midnights.  Dispo: The patient is from: Home              Anticipated d/c is to: Home              Anticipated d/c date is: 1 day              Patient currently is not medically stable to d/c.   Difficult to place patient No  Jonnie Finner DO [facility]  If 7PM-7AM, please contact night-coverage [email or web address]  04/04/2020, 7:17 AM

## 2020-04-05 DIAGNOSIS — R112 Nausea with vomiting, unspecified: Secondary | ICD-10-CM | POA: Diagnosis not present

## 2020-04-05 LAB — CBC
HCT: 31.1 % — ABNORMAL LOW (ref 36.0–46.0)
Hemoglobin: 10.1 g/dL — ABNORMAL LOW (ref 12.0–15.0)
MCH: 29.4 pg (ref 26.0–34.0)
MCHC: 32.5 g/dL (ref 30.0–36.0)
MCV: 90.7 fL (ref 80.0–100.0)
Platelets: 295 10*3/uL (ref 150–400)
RBC: 3.43 MIL/uL — ABNORMAL LOW (ref 3.87–5.11)
RDW: 12.9 % (ref 11.5–15.5)
WBC: 9.9 10*3/uL (ref 4.0–10.5)
nRBC: 0 % (ref 0.0–0.2)

## 2020-04-05 LAB — COMPREHENSIVE METABOLIC PANEL
ALT: 26 U/L (ref 0–44)
AST: 28 U/L (ref 15–41)
Albumin: 2.7 g/dL — ABNORMAL LOW (ref 3.5–5.0)
Alkaline Phosphatase: 50 U/L (ref 38–126)
Anion gap: 7 (ref 5–15)
BUN: 19 mg/dL (ref 6–20)
CO2: 27 mmol/L (ref 22–32)
Calcium: 8.6 mg/dL — ABNORMAL LOW (ref 8.9–10.3)
Chloride: 104 mmol/L (ref 98–111)
Creatinine, Ser: 0.79 mg/dL (ref 0.44–1.00)
GFR, Estimated: >60 mL/min (ref >60)
Glucose, Bld: 136 mg/dL — ABNORMAL HIGH (ref 70–99)
Potassium: 4 mmol/L (ref 3.5–5.1)
Sodium: 138 mmol/L (ref 135–145)
Total Bilirubin: 1.4 mg/dL — ABNORMAL HIGH (ref 0.3–1.2)
Total Protein: 5.9 g/dL — ABNORMAL LOW (ref 6.5–8.1)

## 2020-04-05 LAB — GLUCOSE, CAPILLARY
Glucose-Capillary: 114 mg/dL — ABNORMAL HIGH (ref 70–99)
Glucose-Capillary: 150 mg/dL — ABNORMAL HIGH (ref 70–99)
Glucose-Capillary: 215 mg/dL — ABNORMAL HIGH (ref 70–99)
Glucose-Capillary: 174 mg/dL — ABNORMAL HIGH (ref 70–99)

## 2020-04-05 MED ORDER — ALUM & MAG HYDROXIDE-SIMETH 200-200-20 MG/5ML PO SUSP: 30.0000 mL | Freq: Four times a day (QID) | ORAL | Status: DC | PRN

## 2020-04-05 MED ORDER — CALCIUM CARBONATE ANTACID 500 MG PO CHEW: 1.0000 | CHEWABLE_TABLET | Freq: Two times a day (BID) | ORAL | Status: DC | PRN

## 2020-04-05 MED ORDER — METOCLOPRAMIDE HCL 5 MG/ML IJ SOLN: 5.0000 mg | Freq: Three times a day (TID) | INTRAMUSCULAR | Status: DC

## 2020-04-05 MED ORDER — ONDANSETRON HCL 4 MG/2ML IJ SOLN: 4.0000 mg | Freq: Four times a day (QID) | INTRAMUSCULAR | Status: DC | PRN

## 2020-04-05 MED ORDER — METOCLOPRAMIDE HCL 5 MG/ML IJ SOLN
5.0000 mg | Freq: Three times a day (TID) | INTRAMUSCULAR | Status: DC
  Administered 2020-04-05 – 2020-04-06 (×3): 5 mg via INTRAVENOUS
  Filled 2020-04-05 (×3): qty 2

## 2020-04-05 NOTE — Progress Notes (Signed)
PROGRESS NOTE    Kelli Hudson  XFG:[id number] DOB: 1990/04/19 DOA: 04/03/2020 PCP: Mliss Sax, MD   Chief Complaint  Patient presents with  . Abdominal Pain  . Vomiting  Brief Narrative: 30 year old female with history of insulin-dependent diabetes/diabetic gastroparesis, hypertension presenting with nausea vomiting and abdominal pain symptom onset 2/15 morning.  She has had recurrent episode in the past and had ED visit for that. She is seen in the ED found to be tachycardic hypertensive and was admitted for intractable nausea vomiting and abdominal pain.  Subjective: Seen this morning she was feeling much better wanted to try some diet changed to full liquid diet after eating a few bites of crackers again had nausea did not tolerate.   Assessment & Plan:  Intractable nausea and vomiting abdominal pain/Diabetic gastroparesis/With history of PUD: Still nauseous, will keep on IV fluids, diet advanced to full liquid diet as tolerated then to soft diet, continue on sliding scale insulin Lantus PPI Benadryl, resume her Reglan as IV-EKG reviewed and QTC not prolonged. [facility] has been contacted for outpatient follow-up as per patient she was supposed to have a gastric emptying study and was apparently canceled?reason, notified [facility] PA Gunnar Fusi to arrange outpatient follow-up and gastric emptying study as outpatient  Dehydration/ketosis in the urine in the setting of intractable nausea and vomiting UA-ketones 20.  Continue IV fluid hydration, diet as above  Hypertension blood pressure is stable, on atenolol and lisinopril  Mildly elevated total bilirubin in the setting of nausea vomiting.  Monitor  Anemia of chronic disease hemoglobin at 10.1 g.  Monitor  Insulin dependent diabetes with gastroparesis: Blood sugar is controlled on current insulin regimen. Hemoglobin A1c 10.8 poorly controlled. Recent Labs  Lab 04/04/20 1624 04/04/20 1911 04/04/20 2208  04/05/20 0723 04/05/20 1117  GLUCAP 156* 140* 182* 114* 150*   I-STAT hCG 6.1 slightly high, serum pregnancy negative. OP f/u  Nutrition: Diet Order            Diet clear liquid Room service appropriate? Yes; Fluid consistency: Thin  Diet effective now               Pt's Body mass index is 24.33 kg/m. DVT prophylaxis: enoxaparin (LOVENOX) injection 40 mg Start: 04/04/20 2200 Code Status:   Code Status: Full Code  Family Communication: plan of care discussed with patient at bedside, updated mother on the phone.  Status [id number] as Observation Patient continues to require hospitalization for ongoing management of her intractable nausea vomiting abdominal pain with ongoing symptoms diet is being advanced slowly  Dispo: The patient is from: Home              Anticipated d/c is to: Home              Anticipated d/c date is: 1 day              Patient currently is not medically stable to d/c.   Difficult to place patient No  Consultants:see note  Procedures:see note  Unresulted Labs (From admission, onward)          Start     Ordered   04/11/20 0500  Creatinine, serum  (enoxaparin (LOVENOX)    CrCl >/= 30 ml/min)  Weekly,   R     Comments: while on enoxaparin therapy    04/04/20 1214   04/06/20 0500  Basic metabolic panel  Daily,   R     Question:  Specimen collection method  Answer:  Lab=Lab  collect   04/05/20 1042   04/06/20 0500  CBC  Daily,   R     Question:  Specimen collection method  Answer:  Lab=Lab collect   04/05/20 1042   04/04/20 2048  Pregnancy, urine  Once,   R        04/04/20 2047          Culture/Microbiology    Component Value Date/Time   SDES TOE 03/10/2018 1245   SPECREQUEST NONE 03/10/2018 1245   CULT  03/10/2018 1245    No growth aerobically or anaerobically. Performed at [facility], [address] [place], [place] [address]    REPTSTATUS 03/15/2018 FINAL 03/10/2018 1245    Other culture-see note  Medications: Scheduled  Meds: . atenolol  25 mg Oral BID  . dicyclomine  20 mg Oral TID AC & HS  . docusate sodium  100 mg Oral BID  . enoxaparin (LOVENOX) injection  40 mg Subcutaneous Q24H  . folic acid  1 mg Oral Daily  . insulin aspart  0-15 Units Subcutaneous TID WC  . insulin aspart  0-5 Units Subcutaneous QHS  . insulin glargine  10 Units Subcutaneous QHS  . lisinopril  5 mg Oral Daily  . metoCLOPramide (REGLAN) injection  5 mg Intravenous TID AC  . multivitamin with minerals  1 tablet Oral Daily  . norethindrone-ethinyl estradiol  1 tablet Oral Daily  . pantoprazole  40 mg Oral Daily   Continuous Infusions: . sodium chloride 100 mL/hr at 04/05/20 0430    Antimicrobials: Anti-infectives (From admission, onward)   None     Objective: Vitals: Today's Vitals   04/04/20 2351 04/05/20 0500 04/05/20 0726 04/05/20 1120  BP: 113/77 108/70 (!) 104/59 (!) 137/92  Pulse: 97 88 95 98  Resp: 18 16 18 18   Temp: 98 F (36.7 C) 99 F (37.2 C) 98.3 F (36.8 C) 98.2 F (36.8 C)  TempSrc: Oral Oral Oral Oral  SpO2: 100% 99%  98%  Weight:      Height:      PainSc:        Intake/Output Summary (Last 24 hours) at 04/05/2020 1422 Last data filed at 04/05/2020 0200 Gross per 24 hour  Intake 650 ml  Output --  Net 650 ml   Filed Weights   04/03/20 2331  Weight: 72.6 kg   Weight change:   Intake/Output from previous day: 02/16 0701 - 02/17 0700 In: 650 [I.V.:650] Out: -  Intake/Output this shift: No intake/output data recorded. Filed Weights   04/03/20 2331  Weight: 72.6 kg    Examination: General exam: AAOx3 ,NAD, weak appearing. HEENT:Oral mucosa moist, Ear/Nose WNL grossly,dentition normal. Respiratory system: bilaterally clear,no wheezing or crackles,no use of accessory muscle, non tender. Cardiovascular system: S1 & S2 +, regular, No JVD. Gastrointestinal system: Abdomen soft, NT,ND, BS+. Nervous System:Alert, awake, moving extremities and grossly nonfocal Extremities: No edema,  distal peripheral pulses palpable.  Skin: No rashes,no icterus. MSK: Normal muscle bulk,tone, power   Data Reviewed: I have personally reviewed following labs and imaging studies CBC: Recent Labs  Lab 04/04/20 0015 04/04/20 1350 04/05/20 0431  WBC 11.1* 12.4* 9.9  NEUTROABS 9.1*  --   --   HGB 12.0 10.5* 10.1*  HCT 35.3* 32.4* 31.1*  MCV 88.3 89.0 90.7  PLT 367 306 295   Basic Metabolic Panel: Recent Labs  Lab 04/04/20 0015 04/04/20 1350 04/05/20 0431  NA 139  --  138  K 4.3  --  4.0  CL  101  --  104  CO2 24  --  27  GLUCOSE 206*  --  136*  BUN 15  --  19  CREATININE 0.76 0.82 0.79  CALCIUM 9.9  --  8.6*   GFR: Estimated Creatinine Clearance: 105.6 mL/min (by C-G formula based on SCr of 0.79 mg/dL). Liver Function Tests: Recent Labs  Lab 04/04/20 0015 04/05/20 0431  AST 34 28  ALT 28 26  ALKPHOS 64 50  BILITOT 1.4* 1.4*  PROT 7.7 5.9*  ALBUMIN 3.5 2.7*   Recent Labs  Lab 04/04/20 0015  LIPASE 20   No results for input(s): AMMONIA in the last 168 hours. Coagulation Profile: No results for input(s): INR, PROTIME in the last 168 hours. Cardiac Enzymes: No results for input(s): CKTOTAL, CKMB, CKMBINDEX, TROPONINI in the last 168 hours. BNP (last 3 results) No results for input(s): PROBNP in the last 8760 hours. HbA1C: No results for input(s): HGBA1C in the last 72 hours. CBG: Recent Labs  Lab 04/04/20 1624 04/04/20 1911 04/04/20 2208 04/05/20 0723 04/05/20 1117  GLUCAP 156* 140* 182* 114* 150*   Lipid Profile: No results for input(s): CHOL, HDL, LDLCALC, TRIG, CHOLHDL, LDLDIRECT in the last 72 hours. Thyroid Function Tests: No results for input(s): TSH, T4TOTAL, FREET4, T3FREE, THYROIDAB in the last 72 hours. Anemia Panel: No results for input(s): VITAMINB12, FOLATE, FERRITIN, TIBC, IRON, RETICCTPCT in the last 72 hours. Sepsis Labs: No results for input(s): PROCALCITON, LATICACIDVEN in the last 168 hours.  Recent Results (from the past 240  hour(s))  SARS CORONAVIRUS 2 (TAT 6-24 HRS) Nasopharyngeal Nasopharyngeal Swab     Status: None   Collection Time: 04/04/20  4:33 AM   Specimen: Nasopharyngeal Swab  Result Value Ref Range Status   SARS Coronavirus 2 NEGATIVE NEGATIVE Final    Comment: (NOTE) SARS-CoV-2 target nucleic acids are NOT DETECTED.  The SARS-CoV-2 RNA is generally detectable in upper and lower respiratory specimens during the acute phase of infection. Negative results do not preclude SARS-CoV-2 infection, do not rule out co-infections with other pathogens, and should not be used as the sole basis for treatment or other patient management decisions. Negative results must be combined with clinical observations, patient history, and epidemiological information. The expected result is Negative.  Fact Sheet for Patients: HairSlick.no  Fact Sheet for Healthcare Providers: quierodirigir.com  This test is not yet approved or cleared by the [place] FDA and  has been authorized for detection and/or diagnosis of SARS-CoV-2 by FDA under an Emergency Use Authorization (EUA). This EUA will remain  in effect (meaning this test can be used) for the duration of the COVID-19 declaration under Se ction 564(b)(1) of the Act, 21 U.S.C. section 360bbb-3(b)(1), unless the authorization is terminated or revoked sooner.  Performed at [facility], [address], [place], [place] [address]      Radiology Studies: No results found.   LOS: 0 days   Lanae Boast, MD [facility]  04/05/2020, 2:22 PM

## 2020-04-05 NOTE — Treatment Plan (Signed)
Patient did have x1 episode of emesis this am along with intermittent upper GI pain throughout the day.  Scheduled reglan added as well as PRN tums & maalox.  Patient was advanced from clear liquids to full liquids per order.  ** Patient is wanting to have her gastric empty study scheduled w/ GI team before discharge.

## 2020-04-06 DIAGNOSIS — R112 Nausea with vomiting, unspecified: Secondary | ICD-10-CM | POA: Diagnosis not present

## 2020-04-06 LAB — CBC
HCT: 31.9 % — ABNORMAL LOW (ref 36.0–46.0)
Hemoglobin: 10.4 g/dL — ABNORMAL LOW (ref 12.0–15.0)
MCH: 29.6 pg (ref 26.0–34.0)
MCHC: 32.6 g/dL (ref 30.0–36.0)
MCV: 90.9 fL (ref 80.0–100.0)
Platelets: 311 10*3/uL (ref 150–400)
RBC: 3.51 MIL/uL — ABNORMAL LOW (ref 3.87–5.11)
RDW: 12.8 % (ref 11.5–15.5)
WBC: 9.1 10*3/uL (ref 4.0–10.5)
nRBC: 0 % (ref 0.0–0.2)

## 2020-04-06 LAB — BASIC METABOLIC PANEL
Anion gap: 6 (ref 5–15)
BUN: 11 mg/dL (ref 6–20)
CO2: 27 mmol/L (ref 22–32)
Calcium: 8.9 mg/dL (ref 8.9–10.3)
Chloride: 105 mmol/L (ref 98–111)
Creatinine, Ser: 0.66 mg/dL (ref 0.44–1.00)
GFR, Estimated: >60 mL/min (ref >60)
Glucose, Bld: 123 mg/dL — ABNORMAL HIGH (ref 70–99)
Potassium: 4.2 mmol/L (ref 3.5–5.1)
Sodium: 138 mmol/L (ref 135–145)

## 2020-04-06 LAB — GLUCOSE, CAPILLARY
Glucose-Capillary: 148 mg/dL — ABNORMAL HIGH (ref 70–99)
Glucose-Capillary: 221 mg/dL — ABNORMAL HIGH (ref 70–99)

## 2020-04-06 NOTE — Plan of Care (Signed)
Patient discharged home.  Discharge instructions reviewed with patient.  IV removed, cath intact.  All belongings of patient's collected.  Will go home when ride gets here.

## 2020-04-06 NOTE — Discharge Summary (Signed)
Physician Discharge Summary  Kelli Hudson NUU:[id number] DOB: [birth year] DOA: 04/03/2020  PCP: Libby Maw, MD  Admit date: 04/03/2020 Discharge date: 04/06/2020  Admitted From: home Disposition:  home  Recommendations for Outpatient Follow-up:  1. Follow up with PCP and GI as scheduled/instructed 2. Please obtain BMP/CBC in one week 3. Please follow up on the following pending results:  Home Health:no  Equipment/Devices: none  Discharge Condition: Stable Code Status:   Code Status: Full Code Diet recommendation:  Diet Order            Diet full liquid Room service appropriate? Yes; Fluid consistency: Thin  Diet effective now                  Brief/Interim Summary: 30 year old female with history of insulin-dependent diabetes/diabetic gastroparesis, hypertension presenting with nausea vomiting and abdominal pain symptom onset 2/15 morning.  She has had recurrent episode in the past and had ED visit for that. She was seen in the ED found to be tachycardic hypertensive and was admitted for intractable nausea vomiting and abdominal pain. She was managed medically.  Diet was slowly advanced.  She has been tolerating full liquid diet this morning ate a great and also has been taking crackers without any nausea or vomiting or abdominal pain.  She is requesting to be discharged home today.  She has a GI appointment already scheduled and I had talked with GI La[facility].   Discharge Diagnoses:   Intractable nausea and vomiting abdominal pain/Diabetic gastroparesis/With history of PUD: At this time patient is no more nauseous vomiting no abdominal pain tolerating crackers and ate great for breakfast.  She is wanting to go home today.  She will continue PPI heart Reglan and rest of the home medication and follow-up with GI for outpatient work-up including gastric emptying study.    Dehydration/ketosis in the urine  improved tolerating p.o.   Hypertension she will resume her  home  Mildly elevated total bilirubin in the setting of nausea vomiting.  Follow-up outpatient Anemia of chronic disease hemoglobin stable. Insulin dependent diabetes with gastroparesis: Blood sugar is fairly controlled at this time.  Patient will resume home medication upon discharge. Hemoglobin A1c 10.8 poorly controlled-and will need good blood sugar control on outpatient world with primary care doctor follow-up  Consults:  GI  Subjective: Alert awake oriented tolerated breakfast this morning and wants to go home today.  Discharge Exam: Vitals:   04/06/20 0609 04/06/20 1116  BP: (!) 136/92 124/84  Pulse: 88 91  Resp: 18 18  Temp: 97.7 F (36.5 C) 97.9 F (36.6 C)  SpO2: 100% 98%   General: Pt is alert, awake, not in acute distress Cardiovascular: RRR, S1/S2 +, no rubs, no gallops Respiratory: CTA bilaterally, no wheezing, no rhonchi Abdominal: Soft, NT, ND, bowel sounds + Extremities: no edema, no cyanosis  Discharge Instructions  Discharge Instructions    Ambulatory referral to Gastroenterology   Complete by: As directed    What is the reason for referral?: Other Comment - Hx of PUD and gastroparesis   Discharge instructions   Complete by: As directed    CONT ON FULL LIQUID TO SOFT DIET as tolerated.  Follow-up with your GI doctor as scheduled  Please call call MD or return to ER for similar or worsening recurring problem that brought you to hospital or if any fever,nausea/vomiting,abdominal pain, uncontrolled pain, chest pain,  shortness of breath or any other alarming symptoms.  Please follow-up your doctor as instructed in a week  time and call the office for appointment.  Please avoid alcohol, smoking, or any other illicit substance and maintain healthy habits including taking your regular medications as prescribed.  You were cared for by a hospitalist during your hospital stay. If you have any questions about your discharge medications or the care you received  while you were in the hospital after you are discharged, you can call the unit and ask to speak with the hospitalist on call if the hospitalist that took care of you is not available.  Once you are discharged, your primary care physician will handle any further medical issues. Please note that NO REFILLS for any discharge medications will be authorized once you are discharged, as it is imperative that you return to your primary care physician (or establish a relationship with a primary care physician if you do not have one) for your aftercare needs so that they can reassess your need for medications and monitor your lab values   Increase activity slowly   Complete by: As directed      Allergies as of 04/06/2020   No Known Allergies     Medication List    TAKE these medications   ASHWAGANDHA PO Take 1 tablet by mouth daily.   atenolol 25 MG tablet Commonly known as: TENORMIN Take 1 tablet (25 mg total) by mouth 2 (two) times daily.   Basaglar KwikPen 100 UNIT/ML Inject 15 Units into the skin at bedtime.   dicyclomine 20 MG tablet Commonly known as: BENTYL Take 1 tablet (20 mg total) by mouth 4 (four) times daily -  before meals and at bedtime.   feeding supplement (ENSURE COMPLETE) Liqd Take 237 mLs by mouth 2 (two) times daily between meals.   insulin lispro 100 UNIT/ML injection Commonly known as: HumaLOG Inject 0.05 mLs (5 Units total) into the skin 3 (three) times daily with meals.   Insulin Pen Needle 31G X 5 MM Misc 1 Device by Does not apply route QID. For use with insulin pens   lisinopril 5 MG tablet Commonly known as: ZESTRIL Take 5 mg by mouth daily.   metoCLOPramide 5 MG tablet Commonly known as: Reglan Take 1 tablet (5 mg total) by mouth 3 (three) times daily before meals.   multivitamin with minerals Tabs tablet Take 1 tablet by mouth daily.   norethindrone-ethinyl estradiol 1-20 MG-MCG tablet Commonly known as: LOESTRIN Take 1 tablet by mouth daily.    pantoprazole 40 MG tablet Commonly known as: PROTONIX Take 1 tablet (40 mg total) by mouth daily.   True Metrix Blood Glucose Test test strip Generic drug: glucose blood Use as instructed   True Metrix Meter w/Device Kit 1 Device by Does not apply route 4 (four) times daily.   TRUEplus Lancets 28G Misc assist with checking blood sugar TID and qhs       Follow-up Information    Esterwood, Amy S, PA-C Follow up on 04/20/2020.   Specialty: Gastroenterology Why: March 4th with Nicoletta Ba PA at 1:30pm. Please arrive 10 minutes early for paperwork Contact information: [place] [address] [phone]        Libby Maw, MD Follow up in 1 week(s).   Specialty: Family Medicine Contact information: [place] [address] [phone]              No Known Allergies  The results of significant diagnostics from this hospitalization (including imaging, microbiology, ancillary and laboratory) are listed below for reference.  Microbiology: Recent Results (from the past 240 hour(s))  SARS CORONAVIRUS 2 (TAT 6-24 HRS) Nasopharyngeal Nasopharyngeal Swab     Status: None   Collection Time: 04/04/20  4:33 AM   Specimen: Nasopharyngeal Swab  Result Value Ref Range Status   SARS Coronavirus 2 NEGATIVE NEGATIVE Final    Comment: (NOTE) SARS-CoV-2 target nucleic acids are NOT DETECTED.  The SARS-CoV-2 RNA is generally detectable in upper and lower respiratory specimens during the acute phase of infection. Negative results do not preclude SARS-CoV-2 infection, do not rule out co-infections with other pathogens, and should not be used as the sole basis for treatment or other patient management decisions. Negative results must be combined with clinical observations, patient history, and epidemiological information. The expected result is Negative.  Fact Sheet for Patients: SugarRoll.be  Fact  Sheet for Healthcare Providers: https://www.woods-mathews.com/  This test is not yet approved or cleared by the [place] FDA and  has been authorized for detection and/or diagnosis of SARS-CoV-2 by FDA under an Emergency Use Authorization (EUA). This EUA will remain  in effect (meaning this test can be used) for the duration of the COVID-19 declaration under Se ction 564(b)(1) of the Act, 21 U.S.C. section 360bbb-3(b)(1), unless the authorization is terminated or revoked sooner.  Performed at [facility], [place] [address] [place], [place] [address]     Procedures/Studies: US THYROID  Result Date: 03/15/2020 CLINICAL DATA:  Thyromegaly EXAM: THYROID ULTRASOUND TECHNIQUE: Ultrasound examination of the thyroid gland and adjacent soft tissues was performed. COMPARISON:  None. FINDINGS: Parenchymal Echotexture: Mildly heterogeneous Isthmus: 0.3 cm Right lobe: 6.4 x 1.9 x 2.0 cm Left lobe: 5.6 x 1.6 x 2.2 cm _________________________________________________________ Estimated total number of nodules >/= 1 cm: 2 Number of spongiform nodules >/=  2 cm not described below (TR1): 0 Number of mixed cystic and solid nodules >/= 1.5 cm not described below (TR2): 0 _________________________________________________________ Nodule # 1: Location: Right; mid Maximum size: 1.3 cm; Other 2 dimensions: 0.9 x 0.8 cm Composition: solid/almost completely solid (2) Echogenicity: hypoechoic (2) Shape: taller-than-wide (3) Margins: ill-defined (0) Echogenic foci: punctate echogenic foci (3) ACR TI-RADS total points: 10. ACR TI-RADS risk category: TR5 (>/= 7 points). ACR TI-RADS recommendations: **Given size (>/= 1.0 cm) and appearance, fine needle aspiration of this highly suspicious nodule should be considered based on TI-RADS criteria. _________________________________________________________ Nodule # 2: Location: Right; inferior Maximum size: 1.5 cm; Other 2 dimensions: 1.3 x 1.2 cm Composition:  solid/almost completely solid (2) Echogenicity: hypoechoic (2) Shape: not taller-than-wide (0) Margins: ill-defined (0) Echogenic foci: punctate echogenic foci (3) ACR TI-RADS total points: 7. ACR TI-RADS risk category: TR5 (>/= 7 points). ACR TI-RADS recommendations: **Given size (>/= 1.0 cm) and appearance, fine needle aspiration of this highly suspicious nodule should be considered based on TI-RADS criteria. _________________________________________________________ IMPRESSION: 1. Nodule 1 (TI-RADS 5) located in the mid right thyroid lobe measures 1.3 x 0.9 x 0.8 cm and meets criteria for FNA. 2. Nodule 2 TI-RADS 5 located in the inferior right thyroid lobe measures 1.5 x 1.3 x 1.2 cm and meets criteria for FNA. The above is in keeping with the ACR TI-RADS recommendations - J Am Coll Radiol 2017;14:587-595. Electronically Signed   By: Miachel Roux M.D.   On: 03/15/2020 13:56    Labs: BNP (last 3 results) No results for input(s): BNP in the last 8760 hours. Basic Metabolic Panel: Recent Labs  Lab 04/04/20 0015 04/04/20 1350 04/05/20 0431 04/06/20 0416  NA 139  --  138 138  K 4.3  --  4.0 4.2  CL 101  --  104 105  CO2 24  --  27 27  GLUCOSE 206*  --  136* 123*  BUN 15  --  19 11  CREATININE 0.76 0.82 0.79 0.66  CALCIUM 9.9  --  8.6* 8.9   Liver Function Tests: Recent Labs  Lab 04/04/20 0015 04/05/20 0431  AST 34 28  ALT 28 26  ALKPHOS 64 50  BILITOT 1.4* 1.4*  PROT 7.7 5.9*  ALBUMIN 3.5 2.7*   Recent Labs  Lab 04/04/20 0015  LIPASE 20   No results for input(s): AMMONIA in the last 168 hours. CBC: Recent Labs  Lab 04/04/20 0015 04/04/20 1350 04/05/20 0431 04/06/20 0416  WBC 11.1* 12.4* 9.9 9.1  NEUTROABS 9.1*  --   --   --   HGB 12.0 10.5* 10.1* 10.4*  HCT 35.3* 32.4* 31.1* 31.9*  MCV 88.3 89.0 90.7 90.9  PLT 367 306 295 311   Cardiac Enzymes: No results for input(s): CKTOTAL, CKMB, CKMBINDEX, TROPONINI in the last 168 hours. BNP: Invalid input(s):  POCBNP CBG: Recent Labs  Lab 04/05/20 1117 04/05/20 1600 04/05/20 2025 04/06/20 0731 04/06/20 1113  GLUCAP 150* 215* 174* 148* 221*   D-Dimer No results for input(s): DDIMER in the last 72 hours. Hgb A1c No results for input(s): HGBA1C in the last 72 hours. Lipid Profile No results for input(s): CHOL, HDL, LDLCALC, TRIG, CHOLHDL, LDLDIRECT in the last 72 hours. Thyroid function studies No results for input(s): TSH, T4TOTAL, T3FREE, THYROIDAB in the last 72 hours.  Invalid input(s): FREET3 Anemia work up No results for input(s): VITAMINB12, FOLATE, FERRITIN, TIBC, IRON, RETICCTPCT in the last 72 hours. Urinalysis    Component Value Date/Time   COLORURINE YELLOW 04/04/2020 0130   APPEARANCEUR HAZY (A) 04/04/2020 0130   LABSPEC 1.013 04/04/2020 0130   PHURINE 7.0 04/04/2020 0130   GLUCOSEU 150 (A) 04/04/2020 0130   GLUCOSEU >=1000 (A) 07/05/2019 1343   HGBUR SMALL (A) 04/04/2020 0130   BILIRUBINUR NEGATIVE 04/04/2020 0130   KETONESUR 20 (A) 04/04/2020 0130   PROTEINUR 100 (A) 04/04/2020 0130   UROBILINOGEN 0.2 07/05/2019 1343   NITRITE NEGATIVE 04/04/2020 0130   LEUKOCYTESUR NEGATIVE 04/04/2020 0130   Sepsis Labs Invalid input(s): PROCALCITONIN,  WBC,  LACTICIDVEN Microbiology Recent Results (from the past 240 hour(s))  SARS CORONAVIRUS 2 (TAT 6-24 HRS) Nasopharyngeal Nasopharyngeal Swab     Status: None   Collection Time: 04/04/20  4:33 AM   Specimen: Nasopharyngeal Swab  Result Value Ref Range Status   SARS Coronavirus 2 NEGATIVE NEGATIVE Final    Comment: (NOTE) SARS-CoV-2 target nucleic acids are NOT DETECTED.  The SARS-CoV-2 RNA is generally detectable in upper and lower respiratory specimens during the acute phase of infection. Negative results do not preclude SARS-CoV-2 infection, do not rule out co-infections with other pathogens, and should not be used as the sole basis for treatment or other patient management decisions. Negative results must be  combined with clinical observations, patient history, and epidemiological information. The expected result is Negative.  Fact Sheet for Patients: SugarRoll.be  Fact Sheet for Healthcare Providers: https://www.woods-mathews.com/  This test is not yet approved or cleared by the [place] FDA and  has been authorized for detection and/or diagnosis of SARS-CoV-2 by FDA under an Emergency Use Authorization (EUA). This EUA will remain  in effect (meaning this test can be used) for the duration of the COVID-19 declaration under Se ction 564(b)(1) of  the Act, 21 U.S.C. section 360bbb-3(b)(1), unless the authorization is terminated or revoked sooner.  Performed at [facility], [place] [address] [place], [place] [address]      Time coordinating discharge: 25 minutes  SIGNED: Antonieta Pert, MD  [facility] 04/06/2020, 12:16 PM  If 7PM-7AM, please contact night-coverage [email or web address]

## 2020-04-09 ENCOUNTER — Telehealth: Payer: Self-pay

## 2020-04-09 NOTE — Telephone Encounter (Signed)
Transition Care Management Follow-up Telephone Call  Date of discharge and from where: 04/06/20-[facility]  How have you been since you were released from the hospital? Feeling alright  Any questions or concerns? No  Items Reviewed:  Did the pt receive and understand the discharge instructions provided? Yes   Medications obtained and verified? Yes   Other? Yes   Any new allergies since your discharge? No   Dietary orders reviewed? Yes  Do you have support at home? Yes   Home Care and Equipment/Supplies: Were home health services ordered? no If so, what is the name of the agency? n/a  Has the agency set up a time to come to the patient's home? not applicable Were any new equipment or medical supplies ordered?  No What is the name of the medical supply agency? n/a Were you able to get the supplies/equipment? not applicable Do you have any questions related to the use of the equipment or supplies? n/a  Functional Questionnaire: (I = Independent and D = Dependent) ADLs: I  Bathing/Dressing- I  Meal Prep- I  Eating- I  Maintaining continence- I  Transferring/Ambulation- I  Managing Meds- I  Follow up appointments reviewed:   PCP Hospital f/u appt confirmed? Yes  Scheduled to see Dr. Veto Kemps on 04/19/20 @ 11:00.  Specialist Hospital f/u appt confirmed? Yes  Scheduled to see Amy Esterwood on 04/20/20 @ 1:30.  Are transportation arrangements needed? No   If their condition worsens, is the pt aware to call PCP or go to the Emergency Dept.? Yes  Was the patient provided with contact information for the PCP's office or ED? Yes  Was to pt encouraged to call back with questions or concerns? Yes

## 2020-04-11 ENCOUNTER — Other Ambulatory Visit: Payer: Self-pay | Admitting: Family Medicine

## 2020-04-11 ENCOUNTER — Telehealth: Payer: Self-pay | Admitting: Family Medicine

## 2020-04-11 DIAGNOSIS — E0865 Diabetes mellitus due to underlying condition with hyperglycemia: Secondary | ICD-10-CM

## 2020-04-11 MED ORDER — INSULIN REGULAR HUMAN 100 UNIT/ML IJ SOLN: 5.0000 [IU] | Freq: Three times a day (TID) | INTRAMUSCULAR | 11 refills | Status: DC

## 2020-04-11 NOTE — Telephone Encounter (Signed)
Pt called and said that her insulin lispro is not covered under her insurance anymore and she needs something different that will be covered. Please advise [phone]

## 2020-04-11 NOTE — Telephone Encounter (Signed)
Please advise message below. Patient last visit 03/07/20 with Oran Rein patient has a upcoming visit with Dr. Veto Kemps on 04/19/20.

## 2020-04-12 NOTE — Telephone Encounter (Signed)
Called patient to inform that Novolin was sent in, no answer LMTCB.

## 2020-04-19 ENCOUNTER — Other Ambulatory Visit: Payer: Self-pay

## 2020-04-19 ENCOUNTER — Ambulatory Visit (INDEPENDENT_AMBULATORY_CARE_PROVIDER_SITE_OTHER): Payer: 59 | Admitting: Family Medicine

## 2020-04-19 ENCOUNTER — Encounter: Payer: Self-pay | Admitting: Family Medicine

## 2020-04-19 VITALS — BP 116/78 | HR 93 | Temp 97.5°F | Ht 68.0 in | Wt 159.2 lb

## 2020-04-19 DIAGNOSIS — Z309 Encounter for contraceptive management, unspecified: Secondary | ICD-10-CM

## 2020-04-19 DIAGNOSIS — E1069 Type 1 diabetes mellitus with other specified complication: Secondary | ICD-10-CM | POA: Diagnosis not present

## 2020-04-19 DIAGNOSIS — K3184 Gastroparesis: Secondary | ICD-10-CM | POA: Diagnosis not present

## 2020-04-19 LAB — CBC
HCT: 34.5 % — ABNORMAL LOW (ref 36.0–46.0)
Hemoglobin: 11.6 g/dL — ABNORMAL LOW (ref 12.0–15.0)
MCHC: 33.8 g/dL (ref 30.0–36.0)
MCV: 88.4 fl (ref 78.0–100.0)
Platelets: 382 10*3/uL (ref 150.0–400.0)
RBC: 3.9 Mil/uL (ref 3.87–5.11)
RDW: 13.6 % (ref 11.5–15.5)
WBC: 7.9 10*3/uL (ref 4.0–10.5)

## 2020-04-19 LAB — COMPREHENSIVE METABOLIC PANEL
ALT: 27 U/L (ref 0–35)
AST: 22 U/L (ref 0–37)
Albumin: 3.5 g/dL (ref 3.5–5.2)
Alkaline Phosphatase: 83 U/L (ref 39–117)
BUN: 28 mg/dL — ABNORMAL HIGH (ref 6–23)
CO2: 30 mEq/L (ref 19–32)
Calcium: 9.7 mg/dL (ref 8.4–10.5)
Chloride: 97 mEq/L (ref 96–112)
Creatinine, Ser: 0.88 mg/dL (ref 0.40–1.20)
GFR: 89.15 mL/min (ref >60.00)
Glucose, Bld: 335 mg/dL — ABNORMAL HIGH (ref 70–99)
Potassium: 4.7 mEq/L (ref 3.5–5.1)
Sodium: 133 mEq/L — ABNORMAL LOW (ref 135–145)
Total Bilirubin: 0.7 mg/dL (ref 0.2–1.2)
Total Protein: 6.4 g/dL (ref 6.0–8.3)

## 2020-04-19 NOTE — Progress Notes (Signed)
[facility] [address] [place] [address] Dept: [phone] Dept Fax: [phone]  Acute Office Visit  Subjective:    Patient ID: Kelli Hudson, female    DOB: [birth year], 30 y.o..   MRN: [id number]  Chief Complaint  Patient presents with  . Hospitalization Follow-up    Hospital follow up, no concerns     History of Present Illness:  Patient is in today for reassessment after a recent hospitalization for diabetic gastroparesis. She notes that she is doing well at this point. She is eating without complaint. She is on Reglan 5 mg qac. Her mother raises some concerns about the long term effects of Reglan.  Kelli Hudson notes her blood sugars are doing well. She has noted the values to be in the 100s and has not been above 200.  Kelli Hudson notes that she is prescribe Loestrin through an on-line source. She would like a referral to a GYN to manage her contraception and other women's health needs.  Past Medical History: Patient Active Problem List   Diagnosis Date Noted  . Hyperosmolar hyperglycemic state (HHS) ([place]) 02/24/2020  . Intractable nausea and vomiting   . Intractable abdominal pain 01/24/2020  . H. pylori infection 10/07/2019  . Depression with anxiety 07/05/2019  . Diabetic foot ulcer ([place]) 02/21/2018  . Diabetic polyneuropathy associated with type 2 diabetes mellitus ([place]) 09/24/2017  . Pain in right foot 09/24/2017  . Diabetic ketoacidosis without coma associated with type 2 diabetes mellitus ([place]) 01/02/2016  . Gastroparesis   . Leukocytosis   . Diabetes mellitus due to underlying condition, uncontrolled, with hyperosmolarity without coma, without long-term current use of insulin ([place])   . Increased frequency of urination 06/14/2011  . UTI (urinary tract infection) 06/14/2011  . Goiter 10/05/2009  . Uncontrolled diabetes mellitus ([place]) 10/05/2009  . Essential hypertension  10/05/2009   Past Surgical History:  Procedure Laterality Date  . AMPUTATION TOE Left 03/10/2018   Procedure: AMPUTATION FIFTH TOE;  Surgeon: Trula Slade, DPM;  Location: [facility];  Service: Podiatry;  Laterality: Left;  . BIOPSY  01/28/2020   Procedure: BIOPSY;  Surgeon: Otis Brace, MD;  Location: [facility];  Service: Gastroenterology;;  . ESOPHAGOGASTRODUODENOSCOPY N/A 01/28/2020   Procedure: ESOPHAGOGASTRODUODENOSCOPY (EGD);  Surgeon: Otis Brace, MD;  Location: [facility];  Service: Gastroenterology;  Laterality: N/A;  . ESOPHAGOGASTRODUODENOSCOPY (EGD) WITH PROPOFOL Left 09/08/2015   Procedure: ESOPHAGOGASTRODUODENOSCOPY (EGD) WITH PROPOFOL;  Surgeon: Arta Silence, MD;  Location: [facility];  Service: Endoscopy;  Laterality: Left;  . WISDOM TOOTH EXTRACTION     Family History  Problem Relation Age of Onset  . Lung cancer Mother   . Bipolar disorder Father    Outpatient Medications Prior to Visit  Medication Sig Dispense Refill  . atenolol (TENORMIN) 25 MG tablet Take 1 tablet (25 mg total) by mouth 2 (two) times daily. 60 tablet 2  . Blood Glucose Monitoring Suppl (TRUE METRIX METER) w/Device KIT 1 Device by Does not apply route 4 (four) times daily. 1 kit 0  . dicyclomine (BENTYL) 20 MG tablet Take 1 tablet (20 mg total) by mouth 4 (four) times daily -  before meals and at bedtime. 20 tablet 0  . feeding supplement, ENSURE COMPLETE, (ENSURE COMPLETE) LIQD Take 237 mLs by mouth 2 (two) times daily between meals. 14220 mL 3  . glucose blood (TRUE METRIX BLOOD GLUCOSE TEST) test strip Use as instructed 100 each 12  . Insulin Glargine (BASAGLAR KWIKPEN) 100  UNIT/ML Inject 15 Units into the skin at bedtime.     . Insulin Pen Needle 31G X 5 MM MISC 1 Device by Does not apply route QID. For use with insulin pens 100 each 0  . insulin regular (NOVOLIN R) 100 units/mL injection Inject 0.05 mLs (5 Units total) into the skin 3 (three) times daily  before meals. 10 mL 11  . lisinopril (ZESTRIL) 5 MG tablet Take 5 mg by mouth daily.     . metoCLOPramide (REGLAN) 5 MG tablet Take 1 tablet (5 mg total) by mouth 3 (three) times daily before meals. 90 tablet 2  . norethindrone-ethinyl estradiol (LOESTRIN) 1-20 MG-MCG tablet Take 1 tablet by mouth daily.    . pantoprazole (PROTONIX) 40 MG tablet Take 1 tablet (40 mg total) by mouth daily. 30 tablet 0  . ASHWAGANDHA PO Take 1 tablet by mouth daily.    . Multiple Vitamin (MULTIVITAMIN WITH MINERALS) TABS tablet Take 1 tablet by mouth daily. (Patient not taking: Reported on 04/19/2020)    . TRUEPLUS LANCETS 28G MISC assist with checking blood sugar TID and qhs (Patient not taking: Reported on 04/19/2020) 100 each 3   No facility-administered medications prior to visit.   No Known Allergies    Objective:   Today's Vitals   04/19/20 1105  BP: 116/78  Pulse: 93  Temp: (!) 97.5 F (36.4 C)  TempSrc: Temporal  SpO2: 98%  Weight: 159 lb 3.2 oz (72.2 kg)  Height: 5' 8"  (1.727 m)   Body mass index is 24.21 kg/m.   General: Well developed, well nourished. No acute distress. Psych: Alert and oriented x3. Normal mood and affect.  Health Maintenance Due  Topic Date Due  . Hepatitis C Screening  Never done  . PNEUMOCOCCAL POLYSACCHARIDE VACCINE AGE 41-64 HIGH RISK  Never done  . FOOT EXAM  Never done  . OPHTHALMOLOGY EXAM  Never done  . PAP-Cervical Cytology Screening  Never done  . PAP SMEAR-Modifier  Never done  . INFLUENZA VACCINE  Never done     Assessment & Plan:   1. Gastroparesis Improved currently. I discussed with Ms. Benscoter and her mother about the Reglan use. This is the first-line treatment for gastroparesis, preferred over other prokinetic agents. Maintain good diabetes control and appropriate dietary modifications can assist in keeping the dosage low. She could attempt to reduce her use, but should resume the 5 mg TID if her symptoms start to return.  The discharge  summary recommended lab follow-up at discharge.  - CBC - Comprehensive metabolic panel  2. Type 1 diabetes mellitus with other specified complication ([place]) Stab;e. Recommend increased monitoring of blood sugars at times when she has to change her diet due to her gastroparesis flares.  3. Encounter for contraceptive management, unspecified type  - Ambulatory referral to Gynecology  [name], MD

## 2020-04-20 ENCOUNTER — Encounter: Payer: Self-pay | Admitting: Family Medicine

## 2020-04-20 ENCOUNTER — Encounter: Payer: Self-pay | Admitting: Physician Assistant

## 2020-04-20 ENCOUNTER — Ambulatory Visit (INDEPENDENT_AMBULATORY_CARE_PROVIDER_SITE_OTHER): Payer: 59 | Admitting: Physician Assistant

## 2020-04-20 VITALS — BP 80/60 | HR 92 | Ht 68.0 in | Wt 161.2 lb

## 2020-04-20 DIAGNOSIS — D649 Anemia, unspecified: Secondary | ICD-10-CM | POA: Insufficient documentation

## 2020-04-20 DIAGNOSIS — R112 Nausea with vomiting, unspecified: Secondary | ICD-10-CM

## 2020-04-20 DIAGNOSIS — K3184 Gastroparesis: Secondary | ICD-10-CM

## 2020-04-20 HISTORY — DX: Anemia, unspecified: D64.9

## 2020-04-20 MED ORDER — PANTOPRAZOLE SODIUM 40 MG PO TBEC: 40.0000 mg | DELAYED_RELEASE_TABLET | Freq: Every day | ORAL | 11 refills | Status: DC

## 2020-04-20 MED ORDER — ONDANSETRON 4 MG PO TBDP: 4.0000 mg | ORAL_TABLET | Freq: Four times a day (QID) | ORAL | 6 refills | Status: DC | PRN

## 2020-04-20 MED ORDER — METOCLOPRAMIDE HCL 5 MG PO TABS: 5.0000 mg | ORAL_TABLET | Freq: Three times a day (TID) | ORAL | 6 refills | Status: DC

## 2020-04-20 NOTE — Patient Instructions (Signed)
We have sent the following medications to your pharmacy for you to pick up at your convenience: Zofran, reglan, Protonix   Stop the Bentyl.  We are providing you with a gastroparesis diet today.   You will be contacted by [facility] Scheduling in the next 2 days to arrange a gastric emptying scan.  The number on your caller ID will be [phone], please answer when they call.  If you have not heard from them in 2 days please call [phone] to schedule.     Due to recent changes in healthcare laws, you may see the results of your imaging and laboratory studies on MyChart before your provider has had a chance to review them.  We understand that in some cases there may be results that are confusing or concerning to you. Not all laboratory results come back in the same time frame and the provider may be waiting for multiple results in order to interpret others.  Please give [place] 48 hours in order for your provider to thoroughly review all the results before contacting the office for clarification of your results.    I appreciate the opportunity to care for you. Amy Esterwood, PA-C

## 2020-04-20 NOTE — Progress Notes (Signed)
Subjective:    Patient ID: Kelli Hudson, female    DOB: 01-Sep-1990, 30 y.o.   MRN: [id number]  HPI Kelli Hudson is a pleasant 30 year old African-American female, new to GI today and referred by [facility]/after recent admission 2/15 through 04/06/2020 with acute nausea vomiting and abdominal pain. Patient is an insulin-dependent diabetic with history of hypertension, prior history of H. pylori and with history of anxiety and depression. She had undergone EGD while hospitalized in December 2021 at that time with nausea vomiting and hematemesis.  That was done per Dr. Alessandra Bevels and showed mild gastritis and was otherwise negative exam.  Biopsies were negative for H. Pylori. Patient says she has been having recurrent episodes of nausea followed by what becomes intractable nausea and vomiting, weakness and sense of dehydration.  She says she usually loses weight during these episodes.  In between these episodes she will have very frequent nausea.  No regular heartburn or indigestion.  It sounds as if she is intolerant to nuts and will get indigestion with that.  Today has no complaints of early satiety.  She says she usually avoids red meat as she feels she does not do digest it.  She has learned to try to back off on her diet during these episodes to liquids.  No regular NSAIDs but will take occasional BC powders for migraines.  No regular cannabis use During her recent hospitalization she was treated with PPI and started on Reglan 5 mg AC meals. Her Symptoms are under better control currently and she has been feeling better over the past couple of weeks. It was suggested to her during hospitalization that she should have a gastric emptying scan.  Her diabetes has not been well controlled, last hemoglobin A1c was 10.8 in January 2022.  She says she has been working very hard over the past couple of months to improve her diabetic control.  She says she had been in a bad situation, bad  relationship and was unable to take good care of herself.  She has remedied that situation and is focusing on her health.  Patient did have CT of the abdomen pelvis done in December 2021 which was unremarkable other than a under distended colon and fibroid uterus    Review of Systems Pertinent positive and negative review of systems were noted in the above HPI section.  All other review of systems was otherwise negative.  Outpatient Encounter Medications as of 04/20/2020  Medication Sig  . ASHWAGANDHA PO Take 1 tablet by mouth daily.  [name] atenolol (TENORMIN) 25 MG tablet Take 1 tablet (25 mg total) by mouth 2 (two) times daily.  . Blood Glucose Monitoring Suppl (TRUE METRIX METER) w/Device KIT 1 Device by Does not apply route 4 (four) times daily.  [name] dicyclomine (BENTYL) 20 MG tablet Take 1 tablet (20 mg total) by mouth 4 (four) times daily -  before meals and at bedtime.  . feeding supplement, ENSURE COMPLETE, (ENSURE COMPLETE) LIQD Take 237 mLs by mouth 2 (two) times daily between meals.  [name] glucose blood (TRUE METRIX BLOOD GLUCOSE TEST) test strip Use as instructed  . Insulin Glargine (BASAGLAR KWIKPEN) 100 UNIT/ML Inject 15 Units into the skin at bedtime.   . Insulin Pen Needle 31G X 5 MM MISC 1 Device by Does not apply route QID. For use with insulin pens  . insulin regular (NOVOLIN R) 100 units/mL injection Inject 0.05 mLs (5 Units total) into the skin 3 (three) times daily before meals.  [name] lisinopril (  ZESTRIL) 5 MG tablet Take 5 mg by mouth daily.   . metoCLOPramide (REGLAN) 5 MG tablet Take 1 tablet (5 mg total) by mouth 3 (three) times daily before meals.  . Multiple Vitamin (MULTIVITAMIN WITH MINERALS) TABS tablet Take 1 tablet by mouth daily.  . norethindrone-ethinyl estradiol (LOESTRIN) 1-20 MG-MCG tablet Take 1 tablet by mouth daily.  . ondansetron (ZOFRAN ODT) 4 MG disintegrating tablet Take 1 tablet (4 mg total) by mouth every 6 (six) hours as needed for nausea or vomiting.  .  pantoprazole (PROTONIX) 40 MG tablet Take 1 tablet (40 mg total) by mouth daily before breakfast.  . TRUEPLUS LANCETS 28G MISC assist with checking blood sugar TID and qhs  . [DISCONTINUED] metoCLOPramide (REGLAN) 5 MG tablet Take 1 tablet (5 mg total) by mouth 3 (three) times daily before meals.  . [DISCONTINUED] pantoprazole (PROTONIX) 40 MG tablet Take 1 tablet (40 mg total) by mouth daily.  . [DISCONTINUED] insulin aspart (NOVOLOG) 100 UNIT/ML FlexPen Inject 5 Units into the skin 3 (three) times daily with meals.  . [DISCONTINUED] promethazine (PHENERGAN) 25 MG tablet Take 1 tablet (25 mg total) by mouth every 8 (eight) hours as needed for nausea or vomiting. (Patient not taking: Reported on 07/05/2019)   No facility-administered encounter medications on file as of 04/20/2020.   No Known Allergies Patient Active Problem List   Diagnosis Date Noted  . Hyperosmolar hyperglycemic state (HHS) ([place]) 02/24/2020  . Intractable nausea and vomiting   . Intractable abdominal pain 01/24/2020  . H. pylori infection 10/07/2019  . Depression with anxiety 07/05/2019  . Diabetic foot ulcer ([place]) 02/21/2018  . Diabetic polyneuropathy associated with type 2 diabetes mellitus ([place]) 09/24/2017  . Pain in right foot 09/24/2017  . Diabetic ketoacidosis without coma associated with type 2 diabetes mellitus ([place]) 01/02/2016  . Gastroparesis   . Leukocytosis   . Diabetes mellitus due to underlying condition, uncontrolled, with hyperosmolarity without coma, without long-term current use of insulin ([place])   . Increased frequency of urination 06/14/2011  . UTI (urinary tract infection) 06/14/2011  . Goiter 10/05/2009  . Uncontrolled diabetes mellitus ([place]) 10/05/2009  . Essential hypertension 10/05/2009   Social History   Socioeconomic History  . Marital status: Significant Other    Spouse name: Not on file  . Number of children: 0  . Years of education: Not on file  . Highest education level: Not on file   Occupational History  . Occupation: Estate manager/land agent  Tobacco Use  . Smoking status: Never Smoker  . Smokeless tobacco: Never Used  Vaping Use  . Vaping Use: Never used  Substance and Sexual Activity  . Alcohol use: Yes    Alcohol/week: 1.0 standard drink    Types: 1 Shots of liquor per week    Comment: occasional   . Drug use: No  . Sexual activity: Yes    Birth control/protection: None    Comment: female partner  Other Topics Concern  . Not on file  Social History Narrative  . Not on file   Social Determinants of Health   Financial Resource Strain: Not on file  Food Insecurity: Not on file  Transportation Needs: Not on file  Physical Activity: Not on file  Stress: Not on file  Social Connections: Not on file  Intimate Partner Violence: Not on file    Kelli Hudson's family history includes Bipolar disorder in her father; Breast cancer in an other family member; Diabetes in her maternal aunt and mother; Heart disease in  an other family member; Kidney disease in an other family member; Liver cancer in her maternal grandfather; Lung cancer in her mother; Ovarian cancer in an other family member; Pancreatic cancer in her maternal uncle; Prostate cancer in her maternal uncle.      Objective:    Vitals:   04/20/20 1314  BP: (!) 80/60  Pulse: 92    Physical Exam Well-developed well-nourished young African-American female in no acute distress.  Height, Weight, 161 BMI 24.5  HEENT; nontraumatic normocephalic, EOMI, PE R LA, sclera anicteric. Oropharynx; not examined today Neck; supple, no JVD Cardiovascular; regular rate and rhythm with S1-S2, no murmur rub or gallop Pulmonary; Clear bilaterally Abdomen; soft, nontender, nondistended, no succussion splash no palpable mass or hepatosplenomegaly, bowel sounds are active Rectal; not done Skin; benign exam, no jaundice rash or appreciable lesions Extremities; no clubbing cyanosis or edema skin warm and  dry Neuro/Psych; alert and oriented x4, grossly nonfocal mood and affect appropriate       Assessment & Plan:   #61 30 year old African-American female with recurrent episodic nausea and vomiting and frequent nausea in between episodes.  Patient is an insulin-dependent diabetic, with recent poor control. Recent hospitalization about 2 weeks ago for intractable nausea and vomiting. Symptoms have improved on PPI and metoclopramide 5 mg 3 times daily AC.  I suspect her symptoms are secondary to diabetic gastroparesis with exacerbations in setting of poorly controlled diabetes.  Recent EGD December 2021 unremarkable with exception of mild gastritis, H. pylori negative. CT abdomen pelvis - December 2021  #2 insulin-dependent diabetes mellitus 3.  Hypertension 4.  History of anxiety/depression  Plan; Gastroparesis diet which she will need to follow chronically.  We reviewed step 3 diet, and backing off to step 1 when she is having active symptoms. Start Zofran ODT 4 mg every 6 hours around-the-clock at onset of nausea, and until episode abates.  She may then use as needed Continue metoclopramide 5 mg 1/2-hour AC-refill sent We discussed importance of maintaining good diabetic control.  If she is unable to get her hemoglobin A1c much improved over the next few months she would benefit from referral to endocrinology. Will schedule for gastric emptying scan. Patient has dicyclomine on her med list but says she had not been taking that regularly and and have asked her to stop using this as will delay gastric emptying Patient will be established with Dr. Havery Moros, further recommendations pending results of gastric emptying scan.  Amy Genia Harold PA-C 04/20/2020   Cc: Libby Maw,*

## 2020-04-22 NOTE — Progress Notes (Signed)
Agree with assessment and plan as outlined.  

## 2020-05-09 ENCOUNTER — Encounter: Payer: Self-pay | Admitting: Nurse Practitioner

## 2020-05-09 ENCOUNTER — Emergency Department (HOSPITAL_COMMUNITY): Payer: 59

## 2020-05-09 ENCOUNTER — Inpatient Hospital Stay (HOSPITAL_COMMUNITY)
Admission: EM | Admit: 2020-05-09 | Discharge: 2020-05-14 | DRG: 074 | Disposition: A | Discharge status: Home / Self Care | Payer: 59 | Attending: Internal Medicine | Admitting: Internal Medicine

## 2020-05-09 ENCOUNTER — Encounter (HOSPITAL_COMMUNITY): Payer: Self-pay

## 2020-05-09 ENCOUNTER — Other Ambulatory Visit: Payer: Self-pay

## 2020-05-09 ENCOUNTER — Ambulatory Visit (INDEPENDENT_AMBULATORY_CARE_PROVIDER_SITE_OTHER): Payer: 59 | Admitting: Nurse Practitioner

## 2020-05-09 VITALS — BP 110/60 | HR 104 | Ht 68.0 in | Wt 163.0 lb

## 2020-05-09 DIAGNOSIS — E1143 Type 2 diabetes mellitus with diabetic autonomic (poly)neuropathy: Principal | ICD-10-CM | POA: Diagnosis present

## 2020-05-09 DIAGNOSIS — Z3009 Encounter for other general counseling and advice on contraception: Secondary | ICD-10-CM | POA: Diagnosis not present

## 2020-05-09 DIAGNOSIS — Z113 Encounter for screening for infections with a predominantly sexual mode of transmission: Secondary | ICD-10-CM | POA: Diagnosis not present

## 2020-05-09 DIAGNOSIS — E1165 Type 2 diabetes mellitus with hyperglycemia: Secondary | ICD-10-CM | POA: Diagnosis not present

## 2020-05-09 DIAGNOSIS — Z20822 Contact with and (suspected) exposure to covid-19: Secondary | ICD-10-CM | POA: Diagnosis present

## 2020-05-09 DIAGNOSIS — Z01419 Encounter for gynecological examination (general) (routine) without abnormal findings: Secondary | ICD-10-CM | POA: Diagnosis not present

## 2020-05-09 DIAGNOSIS — E871 Hypo-osmolality and hyponatremia: Secondary | ICD-10-CM | POA: Diagnosis not present

## 2020-05-09 DIAGNOSIS — R112 Nausea with vomiting, unspecified: Secondary | ICD-10-CM | POA: Diagnosis present

## 2020-05-09 DIAGNOSIS — R17 Unspecified jaundice: Secondary | ICD-10-CM | POA: Diagnosis not present

## 2020-05-09 DIAGNOSIS — E1065 Type 1 diabetes mellitus with hyperglycemia: Secondary | ICD-10-CM

## 2020-05-09 DIAGNOSIS — N179 Acute kidney failure, unspecified: Secondary | ICD-10-CM | POA: Diagnosis present

## 2020-05-09 DIAGNOSIS — Z8711 Personal history of peptic ulcer disease: Secondary | ICD-10-CM

## 2020-05-09 DIAGNOSIS — R339 Retention of urine, unspecified: Secondary | ICD-10-CM | POA: Diagnosis present

## 2020-05-09 DIAGNOSIS — Z794 Long term (current) use of insulin: Secondary | ICD-10-CM

## 2020-05-09 DIAGNOSIS — K3184 Gastroparesis: Secondary | ICD-10-CM | POA: Diagnosis present

## 2020-05-09 DIAGNOSIS — R1110 Vomiting, unspecified: Secondary | ICD-10-CM

## 2020-05-09 DIAGNOSIS — R000 Tachycardia, unspecified: Secondary | ICD-10-CM

## 2020-05-09 DIAGNOSIS — Z89422 Acquired absence of other left toe(s): Secondary | ICD-10-CM

## 2020-05-09 DIAGNOSIS — D649 Anemia, unspecified: Secondary | ICD-10-CM | POA: Diagnosis present

## 2020-05-09 DIAGNOSIS — R109 Unspecified abdominal pain: Secondary | ICD-10-CM

## 2020-05-09 DIAGNOSIS — Z87440 Personal history of urinary (tract) infections: Secondary | ICD-10-CM

## 2020-05-09 DIAGNOSIS — Z8619 Personal history of other infectious and parasitic diseases: Secondary | ICD-10-CM

## 2020-05-09 DIAGNOSIS — I152 Hypertension secondary to endocrine disorders: Secondary | ICD-10-CM | POA: Diagnosis present

## 2020-05-09 DIAGNOSIS — Z79899 Other long term (current) drug therapy: Secondary | ICD-10-CM

## 2020-05-09 DIAGNOSIS — E0865 Diabetes mellitus due to underlying condition with hyperglycemia: Secondary | ICD-10-CM

## 2020-05-09 DIAGNOSIS — Z833 Family history of diabetes mellitus: Secondary | ICD-10-CM

## 2020-05-09 DIAGNOSIS — I10 Essential (primary) hypertension: Secondary | ICD-10-CM | POA: Diagnosis present

## 2020-05-09 DIAGNOSIS — K219 Gastro-esophageal reflux disease without esophagitis: Secondary | ICD-10-CM | POA: Diagnosis present

## 2020-05-09 DIAGNOSIS — E0590 Thyrotoxicosis, unspecified without thyrotoxic crisis or storm: Secondary | ICD-10-CM | POA: Diagnosis present

## 2020-05-09 DIAGNOSIS — IMO0002 Reserved for concepts with insufficient information to code with codable children: Secondary | ICD-10-CM

## 2020-05-09 LAB — URINALYSIS, ROUTINE W REFLEX MICROSCOPIC
Bilirubin Urine: NEGATIVE
Glucose, UA: >=500 mg/dL — AB
Hgb urine dipstick: NEGATIVE
Ketones, ur: 20 mg/dL — AB
Leukocytes,Ua: NEGATIVE
Nitrite: NEGATIVE
Protein, ur: 100 mg/dL — AB
Specific Gravity, Urine: 1.014 (ref 1.005–1.030)
pH: 6 (ref 5.0–8.0)

## 2020-05-09 LAB — COMPREHENSIVE METABOLIC PANEL
ALT: 36 U/L (ref 0–44)
AST: 31 U/L (ref 15–41)
Albumin: 3.9 g/dL (ref 3.5–5.0)
Alkaline Phosphatase: 81 U/L (ref 38–126)
Anion gap: 13 (ref 5–15)
BUN: 27 mg/dL — ABNORMAL HIGH (ref 6–20)
CO2: 27 mmol/L (ref 22–32)
Calcium: 9.8 mg/dL (ref 8.9–10.3)
Chloride: 98 mmol/L (ref 98–111)
Creatinine, Ser: 0.95 mg/dL (ref 0.44–1.00)
GFR, Estimated: >60 mL/min (ref >60)
Glucose, Bld: 264 mg/dL — ABNORMAL HIGH (ref 70–99)
Potassium: 4 mmol/L (ref 3.5–5.1)
Sodium: 138 mmol/L (ref 135–145)
Total Bilirubin: 0.8 mg/dL (ref 0.3–1.2)
Total Protein: 7.8 g/dL (ref 6.5–8.1)

## 2020-05-09 LAB — LIPASE, BLOOD: Lipase: 25 U/L (ref 11–51)

## 2020-05-09 LAB — CBC
HCT: 37 % (ref 36.0–46.0)
Hemoglobin: 12.2 g/dL (ref 12.0–15.0)
MCH: 29.5 pg (ref 26.0–34.0)
MCHC: 33 g/dL (ref 30.0–36.0)
MCV: 89.4 fL (ref 80.0–100.0)
Platelets: 402 10*3/uL — ABNORMAL HIGH (ref 150–400)
RBC: 4.14 MIL/uL (ref 3.87–5.11)
RDW: 13.1 % (ref 11.5–15.5)
WBC: 9.8 10*3/uL (ref 4.0–10.5)
nRBC: 0 % (ref 0.0–0.2)

## 2020-05-09 LAB — I-STAT BETA HCG BLOOD, ED (MC, WL, AP ONLY): I-stat hCG, quantitative: <5 m[IU]/mL (ref <5)

## 2020-05-09 MED ORDER — INSULIN ASPART 100 UNIT/ML ~~LOC~~ SOLN
0.0000 [IU] | Freq: Three times a day (TID) | SUBCUTANEOUS | Status: DC
  Administered 2020-05-10 – 2020-05-11 (×4): 3 [IU] via SUBCUTANEOUS
  Administered 2020-05-11: 2 [IU] via SUBCUTANEOUS
  Administered 2020-05-12 (×3): 3 [IU] via SUBCUTANEOUS
  Administered 2020-05-13: 2 [IU] via SUBCUTANEOUS
  Administered 2020-05-13: 5 [IU] via SUBCUTANEOUS
  Administered 2020-05-13: 2 [IU] via SUBCUTANEOUS
  Administered 2020-05-14: 3 [IU] via SUBCUTANEOUS
  Administered 2020-05-14: 2 [IU] via SUBCUTANEOUS
  Filled 2020-05-09: qty 0.15

## 2020-05-09 MED ORDER — DIPHENHYDRAMINE HCL 50 MG/ML IJ SOLN
12.5000 mg | Freq: Once | INTRAMUSCULAR | Status: AC
  Administered 2020-05-09: 12.5 mg via INTRAVENOUS
  Filled 2020-05-09: qty 1

## 2020-05-09 MED ORDER — FAMOTIDINE IN NACL 20-0.9 MG/50ML-% IV SOLN
20.0000 mg | Freq: Once | INTRAVENOUS | Status: AC
  Administered 2020-05-09: 20 mg via INTRAVENOUS
  Filled 2020-05-09: qty 50

## 2020-05-09 MED ORDER — METOCLOPRAMIDE HCL 5 MG/ML IJ SOLN
10.0000 mg | Freq: Once | INTRAMUSCULAR | Status: AC
  Administered 2020-05-09: 10 mg via INTRAVENOUS
  Filled 2020-05-09: qty 2

## 2020-05-09 MED ORDER — INSULIN DEGLUDEC 100 UNIT/ML ~~LOC~~ SOPN: 13.0000 [IU] | PEN_INJECTOR | Freq: Every day | SUBCUTANEOUS | Status: DC

## 2020-05-09 MED ORDER — SODIUM CHLORIDE 0.9 % IV SOLN
1000.0000 mL | INTRAVENOUS | Status: DC
  Administered 2020-05-09: 1000 mL via INTRAVENOUS

## 2020-05-09 MED ORDER — HYDROMORPHONE HCL 1 MG/ML IJ SOLN
1.0000 mg | INTRAMUSCULAR | Status: AC | PRN
  Administered 2020-05-09 – 2020-05-10 (×3): 1 mg via INTRAVENOUS
  Filled 2020-05-09 (×3): qty 1

## 2020-05-09 MED ORDER — SODIUM CHLORIDE 0.9 % IV BOLUS (SEPSIS)
1000.0000 mL | Freq: Once | INTRAVENOUS | Status: AC
  Administered 2020-05-09: 1000 mL via INTRAVENOUS

## 2020-05-09 MED ORDER — SODIUM CHLORIDE 0.9 % IV SOLN: INTRAVENOUS | Status: DC

## 2020-05-09 MED ORDER — HYDROMORPHONE HCL 1 MG/ML IJ SOLN
1.0000 mg | Freq: Once | INTRAMUSCULAR | Status: AC
  Administered 2020-05-09: 1 mg via INTRAVENOUS
  Filled 2020-05-09: qty 1

## 2020-05-09 MED ORDER — ATENOLOL 25 MG PO TABS
25.0000 mg | ORAL_TABLET | Freq: Two times a day (BID) | ORAL | Status: DC
  Administered 2020-05-09 – 2020-05-14 (×10): 25 mg via ORAL
  Filled 2020-05-09 (×10): qty 1

## 2020-05-09 MED ORDER — LISINOPRIL 5 MG PO TABS
5.0000 mg | ORAL_TABLET | Freq: Every day | ORAL | Status: DC
  Administered 2020-05-10 – 2020-05-14 (×5): 5 mg via ORAL
  Filled 2020-05-09 (×5): qty 1

## 2020-05-09 MED ORDER — INSULIN GLARGINE 100 UNIT/ML ~~LOC~~ SOLN
13.0000 [IU] | Freq: Every day | SUBCUTANEOUS | Status: DC
  Administered 2020-05-09 – 2020-05-13 (×5): 13 [IU] via SUBCUTANEOUS
  Filled 2020-05-09 (×6): qty 0.13

## 2020-05-09 MED ORDER — MORPHINE SULFATE (PF) 4 MG/ML IV SOLN
4.0000 mg | Freq: Once | INTRAVENOUS | Status: AC
  Administered 2020-05-09: 4 mg via INTRAVENOUS
  Filled 2020-05-09: qty 1

## 2020-05-09 MED ORDER — ACETAMINOPHEN 325 MG PO TABS
650.0000 mg | ORAL_TABLET | Freq: Four times a day (QID) | ORAL | Status: DC | PRN
  Administered 2020-05-10 – 2020-05-14 (×7): 650 mg via ORAL
  Filled 2020-05-09 (×9): qty 2

## 2020-05-09 MED ORDER — IOHEXOL 300 MG/ML  SOLN
100.0000 mL | Freq: Once | INTRAMUSCULAR | Status: AC | PRN
  Administered 2020-05-09: 100 mL via INTRAVENOUS

## 2020-05-09 MED ORDER — HALOPERIDOL LACTATE 5 MG/ML IJ SOLN
2.0000 mg | Freq: Once | INTRAMUSCULAR | Status: AC
  Administered 2020-05-09: 2 mg via INTRAVENOUS
  Filled 2020-05-09: qty 1

## 2020-05-09 MED ORDER — ONDANSETRON HCL 4 MG PO TABS: 4.0000 mg | ORAL_TABLET | Freq: Four times a day (QID) | ORAL | Status: DC | PRN

## 2020-05-09 MED ORDER — ACETAMINOPHEN 650 MG RE SUPP: 650.0000 mg | Freq: Four times a day (QID) | RECTAL | Status: DC | PRN

## 2020-05-09 MED ORDER — ONDANSETRON HCL 4 MG/2ML IJ SOLN
4.0000 mg | Freq: Once | INTRAMUSCULAR | Status: AC
  Administered 2020-05-09: 4 mg via INTRAVENOUS
  Filled 2020-05-09: qty 2

## 2020-05-09 MED ORDER — ONDANSETRON HCL 4 MG/2ML IJ SOLN
4.0000 mg | Freq: Four times a day (QID) | INTRAMUSCULAR | Status: DC | PRN
  Administered 2020-05-09 – 2020-05-11 (×2): 4 mg via INTRAVENOUS
  Filled 2020-05-09 (×2): qty 2

## 2020-05-09 NOTE — ED Triage Notes (Signed)
Pt c/o n/v non-stop since this am. Pt c/o gen abdominal pain that radiates to chest. Denies diarrhea. States there was a small amt of blood in vomit earlier. Afebrile

## 2020-05-09 NOTE — H&P (Signed)
History and Physical    Kelli Hudson YKD:[id number] DOB: 1990-06-13 DOA: 05/09/2020  PCP: Libby Maw, MD   Patient coming from: Home  Chief Complaint: Nausea and vomiting, Abdominal pain  HPI: Kelli Hudson is a 30 y.o. female with medical history significant for DM with gastroparesis, hypertension, history of H. pylori infection.  She presents to the emergency room with complaint of diffuse abdominal pain with persistent nausea and vomiting.  Symptoms began this morning.  She does not remember any inciting event. She reports she has had too numerous to count episodes of nausea and vomiting.  She has not had any  coffee-ground emesis with the vomiting. She is also having diffuse severe abdominal pain that does not radiate into the chest or pelvis or back.  Patient denies any diarrhea.  She started to notice some blood tinged discoloration to her emesis after several episodes of vomiting.  She is not having any fever.  Patient does have a history of similar episodes in the past which required hospitalization. She has been diagnosed with diabetic gastroparesis.  This does feel similar to previous episodes.   She reports her blood sugars have been uncontrolled ranging from 75 450 over the last few weeks at home.  She was recently evaluated by low [facility] gastroenterology and had medication prescribed last week which was seen for follow-up in their office.  She reports the medication did not help much but states she does not member the name of the medication.  ED Course: In the emergency room she is found to have a normal white blood cell count and a normal x-ray series of her abdomen.  Does have diffuse abdominal pain and persistent nausea and vomiting despite being given medications in the ER.  She was given IV fluid boluses as well.  Hospitalist service was asked to evaluate patient and manage overnight  Review of Systems:  General: Denies fever, chills, weight loss, night  sweats.  Denies dizziness.  HENT: Denies head trauma, headache, denies change in hearing, tinnitus.  Denies nasal bleeding.  Denies sore throat, sores in mouth.  Denies difficulty swallowing Eyes: Denies blurry vision, pain in eye, drainage.  Denies discoloration of eyes. Neck: Denies pain.  Denies swelling.  Denies pain with movement. Cardiovascular: Denies chest pain.  Denies edema.  Denies orthopnea Respiratory: Denies shortness of breath, cough.  Denies wheezing.  Denies sputum production Gastrointestinal: Reports generalized abdominal pain with nausea and vomiting. Denies swelling.  Denies diarrhea.  Denies melena.  Denies hematemesis. Musculoskeletal: Denies limitation of movement.  Denies deformity or swelling.  Denies pain. Genitourinary: Denies pelvic pain.  Denies urinary frequency or hesitancy.  Denies dysuria.  Skin: Denies rash.  Denies petechiae, purpura, ecchymosis. Neurological: Denies headache.  Denies syncope.  Denies seizure activity. Denies paresthesia.  Denies slurred speech, drooping face.  Denies visual change. Psychiatric: Denies depression, anxiety.  Denies hallucinations.  Past Medical History:  Diagnosis Date  . Acute H. pylori gastric ulcer   . Diabetes mellitus ([place])   . Diabetic gastroparesis ([place])   . Gastroparesis   . GERD (gastroesophageal reflux disease)   . Hypertension   . Hyperthyroidism     Past Surgical History:  Procedure Laterality Date  . AMPUTATION TOE Left 03/10/2018   Procedure: AMPUTATION FIFTH TOE;  Surgeon: Trula Slade, DPM;  Location: [facility];  Service: Podiatry;  Laterality: Left;  . BIOPSY  01/28/2020   Procedure: BIOPSY;  Surgeon: Otis Brace, MD;  Location: [facility] ENDOSCOPY;  Service: Gastroenterology;;  .  ESOPHAGOGASTRODUODENOSCOPY N/A 01/28/2020   Procedure: ESOPHAGOGASTRODUODENOSCOPY (EGD);  Surgeon: Otis Brace, MD;  Location: [facility];  Service: Gastroenterology;  Laterality: N/A;  .  ESOPHAGOGASTRODUODENOSCOPY (EGD) WITH PROPOFOL Left 09/08/2015   Procedure: ESOPHAGOGASTRODUODENOSCOPY (EGD) WITH PROPOFOL;  Surgeon: Arta Silence, MD;  Location: [facility] ENDOSCOPY;  Service: Endoscopy;  Laterality: Left;  . WISDOM TOOTH EXTRACTION      Social History  reports that she has never smoked. She has never used smokeless tobacco. She reports current alcohol use of about 1.0 standard drink of alcohol per week. She reports that she does not use drugs.  No Known Allergies  Family History  Problem Relation Age of Onset  . Lung cancer Mother   . Diabetes Mother   . Bipolar disorder Father   . Liver cancer Maternal Grandfather   . Breast cancer Other        maternal great aunt  . Heart disease Other   . Ovarian cancer Other        maternal great aunt  . Pancreatic cancer Maternal Uncle   . Prostate cancer Maternal Uncle   . Diabetes Maternal Aunt        x 2  . Kidney disease Other        maternal great aunt     Prior to Admission medications   Medication Sig Start Date End Date Taking? Authorizing Provider  atenolol (TENORMIN) 25 MG tablet Take 1 tablet (25 mg total) by mouth 2 (two) times daily. 01/30/20  Yes Mariel Aloe, MD  feeding supplement, ENSURE COMPLETE, (ENSURE COMPLETE) LIQD Take 237 mLs by mouth 2 (two) times daily between meals. 03/07/20  Yes Dutch Quint B, FNP  insulin degludec (TRESIBA FLEXTOUCH) 100 UNIT/ML FlexTouch Pen Inject 13 Units into the skin at bedtime.   Yes [provider]  insulin regular (NOVOLIN R) 100 units/mL injection Inject 0.05 mLs (5 Units total) into the skin 3 (three) times daily before meals. Patient taking differently: Inject 0-5 Units into the skin 3 (three) times daily before meals. Sliding scale 04/11/20  Yes [name], MD  lisinopril (ZESTRIL) 5 MG tablet Take 5 mg by mouth daily.  10/17/19  Yes [provider]  metoCLOPramide (REGLAN) 5 MG tablet Take 1 tablet (5 mg total) by mouth 3 (three) times daily  before meals. 04/20/20  Yes Esterwood, Amy S, PA-C  Multiple Vitamin (MULTIVITAMIN WITH MINERALS) TABS tablet Take 1 tablet by mouth daily.   Yes [provider]  ondansetron (ZOFRAN ODT) 4 MG disintegrating tablet Take 1 tablet (4 mg total) by mouth every 6 (six) hours as needed for nausea or vomiting. 04/20/20  Yes Esterwood, Amy S, PA-C  pantoprazole (PROTONIX) 40 MG tablet Take 1 tablet (40 mg total) by mouth daily before breakfast. 04/20/20  Yes Esterwood, Amy S, PA-C  Blood Glucose Monitoring Suppl (TRUE METRIX METER) w/Device KIT 1 Device by Does not apply route 4 (four) times daily. 07/05/19   Libby Maw, MD  dicyclomine (BENTYL) 20 MG tablet Take 1 tablet (20 mg total) by mouth 4 (four) times daily -  before meals and at bedtime. Patient not taking: Reported on 05/09/2020 02/28/20   Margette Fast, MD  glucose blood (TRUE METRIX BLOOD GLUCOSE TEST) test strip Use as instructed 07/05/19   Libby Maw, MD  Insulin Pen Needle 31G X 5 MM MISC 1 Device by Does not apply route QID. For use with insulin pens 07/05/19   Libby Maw, MD  TRUEPLUS LANCETS 28G  MISC assist with checking blood sugar TID and qhs 09/10/15   Zettie Pho S, PA-C  insulin aspart (NOVOLOG) 100 UNIT/ML FlexPen Inject 5 Units into the skin 3 (three) times daily with meals. 02/23/18 07/05/19  Donne Hazel, MD  promethazine (PHENERGAN) 25 MG tablet Take 1 tablet (25 mg total) by mouth every 8 (eight) hours as needed for nausea or vomiting. Patient not taking: Reported on 07/05/2019 03/10/18 10/04/19  Trula Slade, DPM    Physical Exam: Vitals:   05/09/20 1735 05/09/20 1945 05/09/20 2000 05/09/20 2100  BP: (!) 162/134 (!) 171/106 (!) 158/99 (!) 177/116  Pulse: (!) 121 (!) 128 (!) 120 (!) 123  Resp: (!) _0 Temp:      TempSrc:      SpO2: 100% 99% 97% 95%  Weight:      Height:        Constitutional: NAD, calm, comfortable Vitals:   05/09/20 1735 05/09/20 1945 05/09/20 2000  05/09/20 2100  BP: (!) 162/134 (!) 171/106 (!) 158/99 (!) 177/116  Pulse: (!) 121 (!) 128 (!) 120 (!) 123  Resp: (!) _1 Temp:      TempSrc:      SpO2: 100% 99% 97% 95%  Weight:      Height:       General: WDWN, Alert and oriented x3.  Eyes: EOMI, PERRL, conjunctivae normal.  Sclera nonicteric HENT:  [place]/AT, external ears normal.  Nares patent without epistasis.  Mucous membranes are dry.  Neck: Soft, normal range of motion, supple, no masses, no thyromegaly.  Trachea midline Respiratory: clear to auscultation bilaterally, no wheezing, no crackles. Normal respiratory effort. No accessory muscle use.  Cardiovascular: Regular rhythm, tachycardia, no murmurs / rubs / gallops. No extremity edema. 2+ pedal pulses Abdomen: Soft, Diffuse abdominal tenderness, nondistended, no rebound or guarding. No masses palpated. No hepatosplenomegaly. Bowel sounds hyperactive Musculoskeletal: FROM. no cyanosis. Normal muscle tone.  Skin: Warm, dry, intact no rashes, lesions, ulcers. No induration Neurologic: CN 2-12 grossly intact.  Normal speech.  Sensation intact Psychiatric: Normal judgment and insight.  Normal mood.    Labs on Admission: I have personally reviewed following labs and imaging studies  CBC: Recent Labs  Lab 05/09/20 1728  WBC 9.8  HGB 12.2  HCT 37.0  MCV 89.4  PLT 402*    Basic Metabolic Panel: Recent Labs  Lab 05/09/20 1728  NA 138  K 4.0  CL 98  CO2 27  GLUCOSE 264*  BUN 27*  CREATININE 0.95  CALCIUM 9.8    GFR: Estimated Creatinine Clearance: 88.9 mL/min (by C-G formula based on SCr of 0.95 mg/dL).  Liver Function Tests: Recent Labs  Lab 05/09/20 1728  AST 31  ALT 36  ALKPHOS 81  BILITOT 0.8  PROT 7.8  ALBUMIN 3.9    Urine analysis:    Component Value Date/Time   COLORURINE YELLOW 05/09/2020 1728   APPEARANCEUR CLEAR 05/09/2020 1728   LABSPEC 1.014 05/09/2020 1728   PHURINE 6.0 05/09/2020 1728   GLUCOSEU >=500 (A) 05/09/2020 1728    GLUCOSEU >=1000 (A) 07/05/2019 1343   HGBUR NEGATIVE 05/09/2020 1728   BILIRUBINUR NEGATIVE 05/09/2020 1728   KETONESUR 20 (A) 05/09/2020 1728   PROTEINUR 100 (A) 05/09/2020 1728   UROBILINOGEN 0.2 07/05/2019 1343   NITRITE NEGATIVE 05/09/2020 1728   LEUKOCYTESUR NEGATIVE 05/09/2020 1728    Radiological Exams on Admission: DG Abdomen Acute W/Chest  Result Date: 05/09/2020 CLINICAL DATA:  Nausea vomiting abdominal pain  EXAM: DG ABDOMEN ACUTE WITH 1 VIEW CHEST COMPARISON:  02/25/2020 FINDINGS: There is no evidence of dilated bowel loops or free intraperitoneal air. No radiopaque calculi or other significant radiographic abnormality is seen. Heart size and mediastinal contours are within normal limits. Both lungs are clear. IMPRESSION: Negative abdominal radiographs.  No acute cardiopulmonary disease. Electronically Signed   By: Charles  Clark M.D.   On: 05/09/2020 20:28    EKG: Independently reviewed.  EKG shows sinus tachycardia with nonspecific inferior ST changes but no acute ST elevation or depression.  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 Shidler is placed on medical telemetry for observation.  Pt is continued on basal insulin.  Sliding scale insulin provide as needed for glycemic control. Check hemoglobin A1c Patient with known history of gastroparesis was given dose of Haldol and Reglan in the emergency room but continues to have symptoms. Patient has recently seen gastroenterology as an outpatient and states she was given a medication but she does not member the name and it was not very helpful she states. Patient may need consult with gastroenterology if symptoms do not improve by morning  Active Problems:   Intractable nausea and vomiting  Antiemetic with Zofran provided. IV fluid hydration with normal saline overnight.    Essential hypertension Continue home medications atenolol, lisinopril.  Monitor BP    Abdominal pain Dilaudid for severe abdominal pain overnight ordered.  Pt has hx of pylori infection and she states she has had ulcers in the past.  Will obtain CT of the abdomen to make sure there is not any acute pathology.  X-ray series done in the emergency room was negative.    Tachycardia Patient with sinus tachycardia secondary to pain and probable volume loss with persistent nausea and vomiting today. IV fluid hydration provided.  Monitor on telemetry    DVT prophylaxis: [place] score low. TED hose and ambulation for DVT prophylaxis. Code Status:   Full code  Family Communication:  Diagnosis and plan discussed with patient.  Patient verbalized understanding and agrees with plan.  Further recommendations to follow as clinically indicated Disposition Plan:   Patient is from:  Home  Anticipated DC to:  Home  Anticipated DC date:  Anticipate less than 2 midnight stay in the hospital  Anticipated DC barriers: No barriers to discharge identified at this time  Admission status:  Observation  Bradley S Chotiner MD [facility]  How to contact the [facility] Attending or Consulting provider 7A - 7P or covering provider during after hours 7P -7A, for this patient?   1. Check the care team in [facility] and look for a) attending/consulting [facility] provider listed and b) the [facility] team listed 2. Log into [email or web address] and use [place]'s universal password to access. If you do not have the password, please contact the hospital operator. 3. Locate the [facility] provider you are looking for under [facility] and page to a number that you can be directly reached. 4. If you still have difficulty reaching the provider, please page the DOC (Director on Call) for the Hospitalists listed on amion for assistance.  05/09/2020, 9:34 PM      

## 2020-05-09 NOTE — Progress Notes (Signed)
   Kelli Hudson 1990/07/12 [id number]   History:  30 y.o. G0 presents as new patient to establish care. No GYN complaints. She was on low dose COCs in the past but they made her nauseous, so she would like a different method. Has received Gardasil. T2DM managed by PCP. Sexually active with one partner.   Gynecologic History Patient's last menstrual period was 04/29/2020.   Contraception/Family planning: none  Health Maintenance Last Pap: years ago per patient. Results were: normal Last mammogram: N/A Last colonoscopy: N/A Last Dexa: N/A  Past medical history, past surgical history, family history and social history were all reviewed and documented in the EPIC chart. 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   ROS:  A ROS was performed and pertinent positives and negatives are included.  Exam:  Vitals:   05/09/20 1450  BP: 110/60  Pulse: (!) 104  Weight: 163 lb (73.9 kg)  Height: 5\' 8"  (1.727 m)   Body mass index is 24.78 kg/m.  General appearance:  Normal Thyroid:  Symmetrical, normal in size, without palpable masses or nodularity. Respiratory  Auscultation:  Clear without wheezing or rhonchi Cardiovascular  Auscultation:  Regular rate, without rubs, murmurs or gallops  Edema/varicosities:  Not grossly evident Abdominal  Soft,nontender, without masses, guarding or rebound.  Liver/spleen:  No organomegaly noted  Hernia:  None appreciated  Skin  Inspection:  Grossly normal   Breasts: Examined lying and sitting.   Right: Without masses, retractions, discharge or axillary adenopathy.   Left: Without masses, retractions, discharge or axillary adenopathy. Gentitourinary   Inguinal/mons:  Normal without inguinal adenopathy  External genitalia:  Normal  BUS/Urethra/Skene's glands:  Normal  Vagina:  Normal  Cervix:  Normal  Uterus:  Normal in size, shape and contour.  Midline and mobile  Adnexa/parametria:     Rt: Without masses or tenderness.   Lt: Without masses or  tenderness.  Anus and perineum: Normal  Digital rectal exam: Normal sphincter tone without palpated masses or tenderness  Assessment/Plan:  30 y.o. G0 for annual exam.   Well female exam with routine gynecological exam - Education provided on SBEs, importance of preventative screenings, current guidelines, high calcium diet, regular exercise, and multivitamin daily. Labs with PCP.   Encounter for counseling regarding contraception - Contraceptive options were reviewed, including hormonal methods, both combination (pill, patch, vaginal ring) and progesterone-only (pill, Depo Provera and Nexplanon), intrauterine devices (Mirena, [place], [place], and [place]), and barrier methods (condoms, diaphragm).The mechanisms, risks, benefits and side effects of all methods were discussed. She has been on low dose COCs in the past but had nausea so she would like a different method. She would like Nexplanon. She will schedule appointment for insertion.   Screen for STD (sexually transmitted disease) - GC/chlamydia off pap.   Screening for cervical cancer - Normal pap history. Pap with reflex today.  Return in 1 year for annual.   Sleep[place] eye DNP, 3:18 PM 05/09/2020

## 2020-05-09 NOTE — Patient Instructions (Addendum)
Health Maintenance, Female Adopting a healthy lifestyle and getting preventive care are important in promoting health and wellness. Ask your health care provider about:  The right schedule for you to have regular tests and exams.  Things you can do on your own to prevent diseases and keep yourself healthy. What should I know about diet, weight, and exercise? Eat a healthy diet  Eat a diet that includes plenty of vegetables, fruits, low-fat dairy products, and lean protein.  Do not eat a lot of foods that are high in solid fats, added sugars, or sodium.   Maintain a healthy weight Body mass index (BMI) is used to identify weight problems. It estimates body fat based on height and weight. Your health care provider can help determine your BMI and help you achieve or maintain a healthy weight. Get regular exercise Get regular exercise. This is one of the most important things you can do for your health. Most adults should:  Exercise for at least 150 minutes each week. The exercise should increase your heart rate and make you sweat (moderate-intensity exercise).  Do strengthening exercises at least twice a week. This is in addition to the moderate-intensity exercise.  Spend less time sitting. Even light physical activity can be beneficial. Watch cholesterol and blood lipids Have your blood tested for lipids and cholesterol at 29 years of age, then have this test every 5 years. Have your cholesterol levels checked more often if:  Your lipid or cholesterol levels are high.  You are older than 29 years of age.  You are at high risk for heart disease. What should I know about cancer screening? Depending on your health history and family history, you may need to have cancer screening at various ages. This may include screening for:  Breast cancer.  Cervical cancer.  Colorectal cancer.  Skin cancer.  Lung cancer. What should I know about heart disease, diabetes, and high blood  pressure? Blood pressure and heart disease  High blood pressure causes heart disease and increases the risk of stroke. This is more likely to develop in people who have high blood pressure readings, are of African descent, or are overweight.  Have your blood pressure checked: ? Every 3-5 years if you are 18-39 years of age. ? Every year if you are 40 years old or older. Diabetes Have regular diabetes screenings. This checks your fasting blood sugar level. Have the screening done:  Once every three years after age 40 if you are at a normal weight and have a low risk for diabetes.  More often and at a younger age if you are overweight or have a high risk for diabetes. What should I know about preventing infection? Hepatitis B If you have a higher risk for hepatitis B, you should be screened for this virus. Talk with your health care provider to find out if you are at risk for hepatitis B infection. Hepatitis C Testing is recommended for:  Everyone born from 1945 through 1965.  Anyone with known risk factors for hepatitis C. Sexually transmitted infections (STIs)  Get screened for STIs, including gonorrhea and chlamydia, if: ? You are sexually active and are younger than 29 years of age. ? You are older than 29 years of age and your health care provider tells you that you are at risk for this type of infection. ? Your sexual activity has changed since you were last screened, and you are at increased risk for chlamydia or gonorrhea. Ask your health care provider   if you are at risk.  Ask your health care provider about whether you are at high risk for HIV. Your health care provider may recommend a prescription medicine to help prevent HIV infection. If you choose to take medicine to prevent HIV, you should first get tested for HIV. You should then be tested every 3 months for as long as you are taking the medicine. Pregnancy  If you are about to stop having your period (premenopausal) and  you may become pregnant, seek counseling before you get pregnant.  Take 400 to 800 micrograms (mcg) of folic acid every day if you become pregnant.  Ask for birth control (contraception) if you want to prevent pregnancy. Osteoporosis and menopause Osteoporosis is a disease in which the bones lose minerals and strength with aging. This can result in bone fractures. If you are 65 years old or older, or if you are at risk for osteoporosis and fractures, ask your health care provider if you should:  Be screened for bone loss.  Take a calcium or vitamin D supplement to lower your risk of fractures.  Be given hormone replacement therapy (HRT) to treat symptoms of menopause. Follow these instructions at home: Lifestyle  Do not use any products that contain nicotine or tobacco, such as cigarettes, e-cigarettes, and chewing tobacco. If you need help quitting, ask your health care provider.  Do not use street drugs.  Do not share needles.  Ask your health care provider for help if you need support or information about quitting drugs. Alcohol use  Do not drink alcohol if: ? Your health care provider tells you not to drink. ? You are pregnant, may be pregnant, or are planning to become pregnant.  If you drink alcohol: ? Limit how much you use to 0-1 drink a day. ? Limit intake if you are breastfeeding.  Be aware of how much alcohol is in your drink. In the [place] one drink equals one 12 oz bottle of beer (355 mL), one 5 oz glass of wine (148 mL), or one 1 oz glass of hard liquor (44 mL). General instructions  Schedule regular health, dental, and eye exams.  Stay current with your vaccines.  Tell your health care provider if: ? You often feel depressed. ? You have ever been abused or do not feel safe at home. Summary  Adopting a healthy lifestyle and getting preventive care are important in promoting health and wellness.  Follow your health care provider's instructions about healthy  diet, exercising, and getting tested or screened for diseases.  Follow your health care provider's instructions on monitoring your cholesterol and blood pressure. This information is not intended to replace advice given to you by your health care provider. Make sure you discuss any questions you have with your health care provider. Document Revised: 01/27/2018 Document Reviewed: 01/27/2018 Elsevier Patient Education  2021 Else[employer] Inc.  Etonogestrel implant What is this medicine? ETONOGESTREL (et oh noe JES trel) is a contraceptive (birth control) device. It is used to prevent pregnancy. It can be used for up to 3 years. This medicine may be used for other purposes; ask your health care provider or pharmacist if you have questions. COMMON BRAND NAME(S): Implanon, Nexplanon What should I tell my health care provider before I take this medicine? They need to know if you have any of these conditions:  abnormal vaginal bleeding  blood vessel disease or blood clots  breast, cervical, endometrial, ovarian, liver, or uterine cancer  diabetes  gallbladder disease    heart disease or recent heart attack  high blood pressure  high cholesterol or triglycerides  kidney disease  liver disease  migraine headaches  seizures  stroke  tobacco smoker  an unusual or allergic reaction to etonogestrel, anesthetics or antiseptics, other medicines, foods, dyes, or preservatives  pregnant or trying to get pregnant  breast-feeding How should I use this medicine? This device is inserted just under the skin on the inner side of your upper arm by a health care professional. Talk to your pediatrician regarding the use of this medicine in children. Special care may be needed. Overdosage: If you think you have taken too much of this medicine contact a poison control center or emergency room at once. NOTE: This medicine is only for you. Do not share this medicine with others. What if I miss a  dose? This does not apply. What may interact with this medicine? Do not take this medicine with any of the following medications:  amprenavir  fosamprenavir This medicine may also interact with the following medications:  acitretin  aprepitant  armodafinil  bexarotene  bosentan  carbamazepine  certain medicines for fungal infections like fluconazole, ketoconazole, itraconazole and voriconazole  certain medicines to treat hepatitis, HIV or AIDS  cyclosporine  felbamate  griseofulvin  lamotrigine  modafinil  oxcarbazepine  phenobarbital  phenytoin  primidone  rifabutin  rifampin  rifapentine  St. John's wort  topiramate This list may not describe all possible interactions. Give your health care provider a list of all the medicines, herbs, non-prescription drugs, or dietary supplements you use. Also tell them if you smoke, drink alcohol, or use illegal drugs. Some items may interact with your medicine. What should I watch for while using this medicine? This product does not protect you against HIV infection (AIDS) or other sexually transmitted diseases. You should be able to feel the implant by pressing your fingertips over the skin where it was inserted. Contact your doctor if you cannot feel the implant, and use a non-hormonal birth control method (such as condoms) until your doctor confirms that the implant is in place. Contact your doctor if you think that the implant may have broken or become bent while in your arm. You will receive a user card from your health care provider after the implant is inserted. The card is a record of the location of the implant in your upper arm and when it should be removed. Keep this card with your health records. What side effects may I notice from receiving this medicine? Side effects that you should report to your doctor or health care professional as soon as possible:  allergic reactions like skin rash, itching or  hives, swelling of the face, lips, or tongue  breast lumps, breast tissue changes, or discharge  breathing problems  changes in emotions or moods  coughing up blood  if you feel that the implant may have broken or bent while in your arm  high blood pressure  pain, irritation, swelling, or bruising at the insertion site  scar at site of insertion  signs of infection at the insertion site such as fever, and skin redness, pain or discharge  signs and symptoms of a blood clot such as breathing problems; changes in vision; chest pain; severe, sudden headache; pain, swelling, warmth in the leg; trouble speaking; sudden numbness or weakness of the face, arm or leg  signs and symptoms of liver injury like dark yellow or brown urine; general ill feeling or flu-like symptoms; light-colored stools; loss   of appetite; nausea; right upper belly pain; unusually weak or tired; yellowing of the eyes or skin  unusual vaginal bleeding, discharge Side effects that usually do not require medical attention (report to your doctor or health care professional if they continue or are bothersome):  acne  breast pain or tenderness  headache  irregular menstrual bleeding  nausea This list may not describe all possible side effects. Call your doctor for medical advice about side effects. You may report side effects to FDA at 1-800-FDA-1088. Where should I keep my medicine? This drug is given in a hospital or clinic and will not be stored at home. NOTE: This sheet is a summary. It may not cover all possible information. If you have questions about this medicine, talk to your doctor, pharmacist, or health care provider.  2021 Elsevier/Gold Standard (2018-11-16 11:33:04)  

## 2020-05-09 NOTE — Addendum Note (Signed)
Addended by: Zenovia Jordan A on: 05/09/2020 04:31 PM   Modules accepted: Orders

## 2020-05-09 NOTE — ED Notes (Signed)
ED TO INPATIENT HANDOFF REPORT  Name/Age/Gender Kelli Hudson 30 y.o. female  Code Status    Code Status Orders  (From admission, onward)         Start     Ordered   05/09/20 2150  Full code  Continuous        05/09/20 2149        Code Status History    Date Active Date Inactive Code Status Order ID Comments User Context   04/04/2020 1214 04/06/2020 1934 Full Code [id number]  Teddy Spike, DO ED   02/24/2020 1003 02/27/2020 1622 Full Code [id number]  Teddy Spike, DO ED   01/25/2020 0113 01/30/2020 1637 Full Code [id number]  Briscoe Deutscher, MD ED   02/21/2018 1904 02/23/2018 2052 Full Code [id number]  Ike Bene, MD ED   02/21/2018 1833 02/21/2018 1904 Full Code [id number]  Ike Bene, MD ED   01/02/2016 0842 01/05/2016 1438 Full Code [id number]  Lora Paula, MD Inpatient   09/02/2015 1729 09/09/2015 2148 Full Code [id number]  Meredith Pel, NP ED   Advance Care Planning Activity      Home/SNF/Other Home  Chief Complaint 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) [E11.43, E11.65]  Level of Care/Admitting Diagnosis ED Disposition    ED Disposition Condition Comment   Admit  Hospital Area: [facility] [[id number]]  Level of Care: Telemetry [5]  Admit to tele based on following criteria: Other see comments  Comments: tachycardia  Covid Evaluation: Asymptomatic Screening Protocol (No Symptoms)  Diagnosis: 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 Acute H. pylori gastric ulcer   . Diabetes mellitus (HCC)   . Diabetic gastroparesis (HCC)   . Gastroparesis   . GERD (gastroesophageal reflux disease)   . Hypertension   . Hyperthyroidism     Allergies No Known Allergies  IV Location/Drains/Wounds Patient  Lines/Drains/Airways Status    Active Line/Drains/Airways    Name Placement date Placement time Site Days   Peripheral IV 05/09/20 Right Antecubital 05/09/20  1750  Antecubital  less than 1   Incision (Closed) 03/10/18 Foot Left 03/10/18  1309  -- 791   Wound / Incision (Open or Dehisced) 02/21/18 Diabetic ulcer Toe (Comment  which one) Left 02/21/18  --  Toe (Comment  which one)  808          Labs/Imaging Results for orders placed or performed during the hospital encounter of 05/09/20 (from the past 48 hour(s))  Lipase, blood     Status: None   Collection Time: 05/09/20  5:28 PM  Result Value Ref Range   Lipase 25 11 - 51 U/L    Comment: Performed at [facility], [address], [place], [place] [address]  Comprehensive metabolic panel     Status: Abnormal   Collection Time: 05/09/20  5:28 PM  Result Value Ref Range   Sodium 138 135 - 145 mmol/L   Potassium 4.0 3.5 - 5.1 mmol/L   Chloride 98 98 - 111 mmol/L   CO2 27 22 - 32 mmol/L   Glucose, Bld 264 (H) 70 - 99 mg/dL    Comment: Glucose reference range applies only to samples taken after fasting for at least 8 hours.   BUN 27 (H) 6 - 20 mg/dL   Creatinine, Ser  0.95 0.44 - 1.00 mg/dL   Calcium 9.8 8.9 - 77.4 mg/dL   Total Protein 7.8 6.5 - 8.1 g/dL   Albumin 3.9 3.5 - 5.0 g/dL   AST 31 15 - 41 U/L   ALT 36 0 - 44 U/L   Alkaline Phosphatase 81 38 - 126 U/L   Total Bilirubin 0.8 0.3 - 1.2 mg/dL   GFR, Estimated >12 >87 mL/min    Comment: (NOTE) Calculated using the CKD-EPI Creatinine Equation (2021)    Anion gap 13 5 - 15    Comment: Performed at [facility], [address] [place], [place] [address]  CBC     Status: Abnormal   Collection Time: 05/09/20  5:28 PM  Result Value Ref Range   WBC 9.8 4.0 - 10.5 K/uL   RBC 4.14 3.87 - 5.11 MIL/uL   Hemoglobin 12.2 12.0 - 15.0 g/dL   HCT 20.9 47.0 - 96.2 %   MCV 89.4 80.0 - 100.0 fL   MCH 29.5 26.0 - 34.0 pg   MCHC 33.0 30.0 - 36.0  g/dL   RDW 83.6 62.9 - 47.6 %   Platelets 402 (H) 150 - 400 K/uL   nRBC 0.0 0.0 - 0.2 %    Comment: Performed at [facility], [address], [place], [place] [address]  Urinalysis, Routine w reflex microscopic     Status: Abnormal   Collection Time: 05/09/20  5:28 PM  Result Value Ref Range   Color, Urine YELLOW YELLOW   APPearance CLEAR CLEAR   Specific Gravity, Urine 1.014 1.005 - 1.030   pH 6.0 5.0 - 8.0   Glucose, UA >=500 (A) NEGATIVE mg/dL   Hgb urine dipstick NEGATIVE NEGATIVE   Bilirubin Urine NEGATIVE NEGATIVE   Ketones, ur 20 (A) NEGATIVE mg/dL   Protein, ur 354 (A) NEGATIVE mg/dL   Nitrite NEGATIVE NEGATIVE   Leukocytes,Ua NEGATIVE NEGATIVE   RBC / HPF 0-5 0 - 5 RBC/hpf   WBC, UA 0-5 0 - 5 WBC/hpf   Bacteria, UA RARE (A) NONE SEEN   Squamous Epithelial / LPF 0-5 0 - 5   Mucus PRESENT    Hyaline Casts, UA PRESENT     Comment: Performed at [facility], [address] [place], [place] [address]  I-Stat beta hCG blood, ED     Status: None   Collection Time: 05/09/20  6:08 PM  Result Value Ref Range   I-stat hCG, quantitative <5.0 <5 mIU/mL   Comment 3            Comment:   GEST. AGE      CONC.  (mIU/mL)   <=1 WEEK        5 - 50     2 WEEKS       50 - 500     3 WEEKS       100 - 10,000     4 WEEKS     1,000 - 30,000        FEMALE AND NON-PREGNANT FEMALE:     LESS THAN 5 mIU/mL    CT ABDOMEN PELVIS W CONTRAST  Result Date: 05/09/2020 CLINICAL DATA:  Abdominal pain since this morning. EXAM: CT ABDOMEN AND PELVIS WITH CONTRAST TECHNIQUE: Multidetector CT imaging of the abdomen and pelvis was performed using the standard protocol following bolus administration of intravenous contrast. CONTRAST:  OMNIPAQUE IOHEXOL 300 MG/ML  SOLN COMPARISON:  01/24/2020 FINDINGS: Lower chest: Insert lung bases Hepatobiliary: No hepatic  lesions or intrahepatic biliary dilatation. The gallbladder appears normal. No common bile duct dilatation.  Pancreas: No mass, inflammation or ductal dilatation. Spleen: Normal size.  No focal lesions. Adrenals/Urinary Tract: Adrenal glands and kidneys are unremarkable. No renal mass, renal calculi or findings for pyelonephritis. There is fairly marked distention of the bladder almost up to the umbilicus. Bladder volume is estimated at 1.1 L. no bladder mass, bladder calculi or asymmetric bladder wall thickening. Stomach/Bowel: The stomach, duodenum, small bowel and colon are grossly normal. No acute inflammatory changes, mass lesions or obstructive findings. The terminal ileum is normal. The appendix is normal. Vascular/Lymphatic: The aorta is normal in caliber. No dissection. The branch vessels are patent. The major venous structures are patent. No mesenteric or retroperitoneal mass or adenopathy. Small scattered lymph nodes are noted. Reproductive: The uterus and ovaries are unremarkable. Other: No pelvic mass or adenopathy. No free pelvic fluid collections. No inguinal mass or adenopathy. No abdominal wall hernia or subcutaneous lesions. Musculoskeletal: No significant bony findings. IMPRESSION: 1. No acute abdominal/pelvic findings, mass lesions or adenopathy. 2. Marked distention of the bladder almost up to the umbilicus. Bladder volume is estimated at 1.1 L. Electronically Signed   By: Rudie MeyerP.  Gallerani M.D.   On: 05/09/2020 21:57   DG Abdomen Acute W/Chest  Result Date: 05/09/2020 CLINICAL DATA:  Nausea vomiting abdominal pain EXAM: DG ABDOMEN ACUTE WITH 1 VIEW CHEST COMPARISON:  02/25/2020 FINDINGS: There is no evidence of dilated bowel loops or free intraperitoneal air. No radiopaque calculi or other significant radiographic abnormality is seen. Heart size and mediastinal contours are within normal limits. Both lungs are clear. IMPRESSION: Negative abdominal radiographs.  No acute cardiopulmonary disease. Electronically Signed   By: Marlan Palauharles  Clark M.D.   On: 05/09/2020 20:28    Pending Labs Unresulted Labs  (From admission, onward)          Start     Ordered   05/10/20 0500  Basic metabolic panel  Tomorrow morning,   R        05/09/20 2149   05/09/20 2248  Hemoglobin A1c  Once,   STAT       Comments: To assess prior glycemic control    05/09/20 2247   05/09/20 2053  SARS CORONAVIRUS 2 (TAT 6-24 HRS) Nasopharyngeal Nasopharyngeal Swab  (Tier 3 - Symptomatic/asymptomatic with Precautions)  Once,   STAT       Question Answer Comment  Is this test for diagnosis or screening Screening   Symptomatic for COVID-19 as defined by CDC No   Hospitalized for COVID-19 No   Admitted to ICU for COVID-19 No   Previously tested for COVID-19 Yes   Resident in a congregate (group) care setting No   Employed in healthcare setting No   Pregnant No   Has patient completed COVID vaccination(s) (2 doses of Pfizer/Moderna 1 dose of [employer]) Unknown      05/09/20 2053          Vitals/Pain Today's Vitals   05/09/20 2112 05/09/20 2310 05/09/20 2312 05/09/20 2314  BP:   (!) 172/110 (!) 172/110  Pulse:   (!) 120 (!) 122  Resp:   11   Temp:      TempSrc:      SpO2:   98%   Weight:      Height:      PainSc: Asleep 8       Isolation Precautions No active isolations  Medications Medications  sodium chloride 0.9 % bolus 1,000  mL (0 mLs Intravenous Stopped 05/09/20 2147)    Followed by  0.9 %  sodium chloride infusion (0 mLs Intravenous Stopped 05/09/20 2147)  atenolol (TENORMIN) tablet 25 mg (25 mg Oral Given 05/09/20 2314)  lisinopril (ZESTRIL) tablet 5 mg (has no administration in time range)  insulin aspart (novoLOG) injection 0-15 Units (has no administration in time range)  0.9 %  sodium chloride infusion (has no administration in time range)  acetaminophen (TYLENOL) tablet 650 mg (has no administration in time range)    Or  acetaminophen (TYLENOL) suppository 650 mg (has no administration in time range)  ondansetron (ZOFRAN) tablet 4 mg ( Oral See Alternative 05/09/20 2313)    Or   ondansetron (ZOFRAN) injection 4 mg (4 mg Intravenous Given 05/09/20 2313)  HYDROmorphone (DILAUDID) injection 1 mg (1 mg Intravenous Given 05/09/20 2314)  insulin glargine (LANTUS) injection 13 Units (13 Units Subcutaneous Given 05/09/20 2315)  morphine 4 MG/ML injection 4 mg (4 mg Intravenous Given 05/09/20 1801)  metoCLOPramide (REGLAN) injection 10 mg (10 mg Intravenous Given 05/09/20 1800)  diphenhydrAMINE (BENADRYL) injection 12.5 mg (12.5 mg Intravenous Given 05/09/20 1800)  famotidine (PEPCID) IVPB 20 mg premix (0 mg Intravenous Stopped 05/09/20 1830)  haloperidol lactate (HALDOL) injection 2 mg (2 mg Intravenous Given 05/09/20 1934)  HYDROmorphone (DILAUDID) injection 1 mg (1 mg Intravenous Given 05/09/20 2025)  ondansetron (ZOFRAN) injection 4 mg (4 mg Intravenous Given 05/09/20 2110)  iohexol (OMNIPAQUE) 300 MG/ML solution 100 mL (100 mLs Intravenous Contrast Given 05/09/20 2136)    Mobility walks

## 2020-05-09 NOTE — ED Provider Notes (Signed)
[place] DEPT Provider Note   CSN: [id number] Arrival date & time: 05/09/20  1710     History Chief Complaint  Patient presents with  . Emesis  . Chest Pain    Kelli Hudson is a 30 y.o. female.  HPI   Patient presents to the ED for evaluation of nonstop nausea and vomiting.  Patient states she started having symptoms this morning.  She began having multiple episodes of nausea and vomiting.  He is also having diffuse severe abdominal pain.  Patient denies any diarrhea.  She started to notice some blood tinged discoloration to her emesis after several episodes of vomiting.  She is not having any fever.  Patient does have a history of similar episodes in the past.  She has been diagnosed with diabetic gastroparesis.  This does feel similar to previous episodes.  Patient is unable to get comfortable.  She does have a history of diabetes and states her blood sugars have been running fine. Patient was last admitted to the hospital in February of this year for diabetic gastroparesis.  She also was recently seen in the [place] office earlier this month Past Medical History:  Diagnosis Date  . Acute H. pylori gastric ulcer   . Diabetes mellitus ([place])   . Diabetic gastroparesis ([place])   . Gastroparesis   . GERD (gastroesophageal reflux disease)   . Hypertension   . Hyperthyroidism     Patient Active Problem List   Diagnosis Date Noted  . Normocytic anemia 04/20/2020  . Hyperosmolar hyperglycemic state (HHS) ([place]) 02/24/2020  . Intractable nausea and vomiting   . Intractable abdominal pain 01/24/2020  . H. pylori infection 10/07/2019  . Depression with anxiety 07/05/2019  . Diabetic foot ulcer ([place]) 02/21/2018  . Diabetic polyneuropathy associated with type 2 diabetes mellitus ([place]) 09/24/2017  . Pain in right foot 09/24/2017  . Diabetic ketoacidosis without coma associated with type 2 diabetes mellitus ([place]) 01/02/2016  . Gastroparesis    . Leukocytosis   . Diabetes mellitus due to underlying condition, uncontrolled, with hyperosmolarity without coma, without long-term current use of insulin ([place])   . Increased frequency of urination 06/14/2011  . UTI (urinary tract infection) 06/14/2011  . Goiter 10/05/2009  . Uncontrolled diabetes mellitus ([place]) 10/05/2009  . Essential hypertension 10/05/2009    Past Surgical History:  Procedure Laterality Date  . AMPUTATION TOE Left 03/10/2018   Procedure: AMPUTATION FIFTH TOE;  Surgeon: Trula Slade, DPM;  Location: [facility];  Service: Podiatry;  Laterality: Left;  . BIOPSY  01/28/2020   Procedure: BIOPSY;  Surgeon: Otis Brace, MD;  Location: [facility] ENDOSCOPY;  Service: Gastroenterology;;  . ESOPHAGOGASTRODUODENOSCOPY N/A 01/28/2020   Procedure: ESOPHAGOGASTRODUODENOSCOPY (EGD);  Surgeon: Otis Brace, MD;  Location: [facility];  Service: Gastroenterology;  Laterality: N/A;  . ESOPHAGOGASTRODUODENOSCOPY (EGD) WITH PROPOFOL Left 09/08/2015   Procedure: ESOPHAGOGASTRODUODENOSCOPY (EGD) WITH PROPOFOL;  Surgeon: Arta Silence, MD;  Location: [facility] ENDOSCOPY;  Service: Endoscopy;  Laterality: Left;  . WISDOM TOOTH EXTRACTION       OB History    Gravida  0   Para  0   Term  0   Preterm  0   AB  0   Living  0     SAB  0   IAB  0   Ectopic  0   Multiple  0   Live Births  0           Family History  Problem Relation Age of  Onset  . Lung cancer Mother   . Diabetes Mother   . Bipolar disorder Father   . Liver cancer Maternal Grandfather   . Breast cancer Other        maternal great aunt  . Heart disease Other   . Ovarian cancer Other        maternal great aunt  . Pancreatic cancer Maternal Uncle   . Prostate cancer Maternal Uncle   . Diabetes Maternal Aunt        x 2  . Kidney disease Other        maternal great aunt    Social History   Tobacco Use  . Smoking status: Never Smoker  . Smokeless tobacco: Never Used  Vaping  Use  . Vaping Use: Never used  Substance Use Topics  . Alcohol use: Yes    Alcohol/week: 1.0 standard drink    Types: 1 Shots of liquor per week    Comment: occasional   . Drug use: No    Home Medications Prior to Admission medications   Medication Sig Start Date End Date Taking? Authorizing Provider  atenolol (TENORMIN) 25 MG tablet Take 1 tablet (25 mg total) by mouth 2 (two) times daily. 01/30/20   Mariel Aloe, MD  Blood Glucose Monitoring Suppl (TRUE METRIX METER) w/Device KIT 1 Device by Does not apply route 4 (four) times daily. 07/05/19   Libby Maw, MD  dicyclomine (BENTYL) 20 MG tablet Take 1 tablet (20 mg total) by mouth 4 (four) times daily -  before meals and at bedtime. 02/28/20   Long, Wonda Olds, MD  feeding supplement, ENSURE COMPLETE, (ENSURE COMPLETE) LIQD Take 237 mLs by mouth 2 (two) times daily between meals. 03/07/20   Dutch Quint B, FNP  glucose blood (TRUE METRIX BLOOD GLUCOSE TEST) test strip Use as instructed 07/05/19   Libby Maw, MD  Insulin Glargine [facility]) 100 UNIT/ML Inject 15 Units into the skin at bedtime.  12/24/19   [provider]  Insulin Pen Needle 31G X 5 MM MISC 1 Device by Does not apply route QID. For use with insulin pens 07/05/19   Libby Maw, MD  insulin regular (NOVOLIN R) 100 units/mL injection Inject 0.05 mLs (5 Units total) into the skin 3 (three) times daily before meals. 04/11/20   [name], MD  lisinopril (ZESTRIL) 5 MG tablet Take 5 mg by mouth daily.  10/17/19   [provider]  metoCLOPramide (REGLAN) 5 MG tablet Take 1 tablet (5 mg total) by mouth 3 (three) times daily before meals. 04/20/20   Esterwood, Amy S, PA-C  Multiple Vitamin (MULTIVITAMIN WITH MINERALS) TABS tablet Take 1 tablet by mouth daily.    [provider]  ondansetron (ZOFRAN ODT) 4 MG disintegrating tablet Take 1 tablet (4 mg total) by mouth every 6 (six) hours as needed for nausea or vomiting.  04/20/20   Esterwood, Amy S, PA-C  pantoprazole (PROTONIX) 40 MG tablet Take 1 tablet (40 mg total) by mouth daily before breakfast. 04/20/20   Esterwood, Amy S, PA-C  TRUEPLUS LANCETS 28G MISC assist with checking blood sugar TID and qhs 09/10/15   Zettie Pho S, PA-C  insulin aspart (NOVOLOG) 100 UNIT/ML FlexPen Inject 5 Units into the skin 3 (three) times daily with meals. 02/23/18 07/05/19  Donne Hazel, MD  promethazine (PHENERGAN) 25 MG tablet Take 1 tablet (25 mg total) by mouth every 8 (eight) hours as needed for nausea or vomiting. Patient not  taking: Reported on 07/05/2019 03/10/18 10/04/19  Trula Slade, DPM    Allergies    Patient has no known allergies.  Review of Systems   Review of Systems  All other systems reviewed and are negative.   Physical Exam Updated Vital Signs BP (!) 158/99   Pulse (!) 120   Temp 98.3 F (36.8 C) (Oral)   Resp 18   Ht 1.727 m (_0 )   Wt 72.6 kg   LMP 04/29/2020   SpO2 97%   BMI 24.33 kg/m   Physical Exam Vitals and nursing note reviewed.  Constitutional:      General: She is in acute distress.     Appearance: She is well-developed. She is ill-appearing.  HENT:     Head: Normocephalic and atraumatic.     Right Ear: External ear normal.     Left Ear: External ear normal.  Eyes:     General: No scleral icterus.       Right eye: No discharge.        Left eye: No discharge.     Conjunctiva/sclera: Conjunctivae normal.  Neck:     Trachea: No tracheal deviation.  Cardiovascular:     Rate and Rhythm: Normal rate and regular rhythm.  Pulmonary:     Effort: Pulmonary effort is normal. No respiratory distress.     Breath sounds: Normal breath sounds. No stridor. No wheezing or rales.  Abdominal:     General: Bowel sounds are normal. There is no distension.     Palpations: Abdomen is soft.     Tenderness: There is abdominal tenderness. There is guarding. There is no rebound.     Comments: Diffuse tenderness  Musculoskeletal:         General: No tenderness.     Cervical back: Neck supple.  Skin:    General: Skin is warm and dry.     Findings: No rash.  Neurological:     Mental Status: She is alert.     Cranial Nerves: No cranial nerve deficit (no facial droop, extraocular movements intact, no slurred speech).     Sensory: No sensory deficit.     Motor: No abnormal muscle tone or seizure activity.     Coordination: Coordination normal.     ED Results / Procedures / Treatments   Labs (all labs ordered are listed, but only abnormal results are displayed) Labs Reviewed  COMPREHENSIVE METABOLIC PANEL - Abnormal; Notable for the following components:      Result Value   Glucose, Bld 264 (*)    BUN 27 (*)    All other components within normal limits  CBC - Abnormal; Notable for the following components:   Platelets 402 (*)    All other components within normal limits  URINALYSIS, ROUTINE W REFLEX MICROSCOPIC - Abnormal; Notable for the following components:   Glucose, UA >=500 (*)    Ketones, ur 20 (*)    Protein, ur 100 (*)    Bacteria, UA RARE (*)    All other components within normal limits  LIPASE, BLOOD  I-STAT BETA HCG BLOOD, ED ([facility], [facility], [facility] ONLY)    EKG EKG Interpretation  Date/Time:  Wednesday May 09 2020 17:28:21 EDT Ventricular Rate:  134 PR Interval:    QRS Duration: 79 QT Interval:  290 QTC Calculation: 433 R Axis:   72 Text Interpretation: Sinus tachycardia LAE, consider biatrial enlargement Nonspecific T abnormalities, inferior leads Since last tracing rate faster Confirmed by Dorie Rank [phone]) on 05/09/2020  6:10:19 PM   Radiology DG Abdomen Acute W/Chest  Result Date: 05/09/2020 CLINICAL DATA:  Nausea vomiting abdominal pain EXAM: DG ABDOMEN ACUTE WITH 1 VIEW CHEST COMPARISON:  02/25/2020 FINDINGS: There is no evidence of dilated bowel loops or free intraperitoneal air. No radiopaque calculi or other significant radiographic abnormality is seen. Heart size and mediastinal contours  are within normal limits. Both lungs are clear. IMPRESSION: Negative abdominal radiographs.  No acute cardiopulmonary disease. Electronically Signed   By: Franchot Gallo M.D.   On: 05/09/2020 20:28    Procedures .Critical Care Performed by: Dorie Rank, MD Authorized by: Dorie Rank, MD   Critical care provider statement:    Critical care time (minutes):  45   Critical care was time spent personally by me on the following activities:  Discussions with consultants, evaluation of patient's response to treatment, examination of patient, ordering and performing treatments and interventions, ordering and review of laboratory studies, ordering and review of radiographic studies, pulse oximetry, re-evaluation of patient's condition, obtaining history from patient or surrogate and review of old charts     Medications Ordered in ED Medications  sodium chloride 0.9 % bolus 1,000 mL (1,000 mLs Intravenous New Bag/Given 05/09/20 1801)    Followed by  0.9 %  sodium chloride infusion (1,000 mLs Intravenous New Bag/Given 05/09/20 1829)  morphine 4 MG/ML injection 4 mg (4 mg Intravenous Given 05/09/20 1801)  metoCLOPramide (REGLAN) injection 10 mg (10 mg Intravenous Given 05/09/20 1800)  diphenhydrAMINE (BENADRYL) injection 12.5 mg (12.5 mg Intravenous Given 05/09/20 1800)  famotidine (PEPCID) IVPB 20 mg premix (0 mg Intravenous Stopped 05/09/20 1830)  haloperidol lactate (HALDOL) injection 2 mg (2 mg Intravenous Given 05/09/20 1934)  HYDROmorphone (DILAUDID) injection 1 mg (1 mg Intravenous Given 05/09/20 2025)    ED Course  I have reviewed the triage vital signs and the nursing notes.  Pertinent labs & imaging results that were available during my care of the patient were reviewed by me and considered in my medical decision making (see chart for details).  Clinical Course as of 05/09/20 2041  Wed May 09, 2020  1833 CBC normal.  Pregnancy test negative. [JK]  3704 Metabolic panel shows normal bicarb.  Mild  hyperglycemia.  No signs of DKA [JK]  1907 Patient states she still not feeling any better. [JK]  2003 Patient still not feeling better.  Remains tachycardic. [JK]  2004 Recent CT scan in February.  No acute abnormalities. [JK]  2040 Urinalysis without signs of infection. [JK]  2041 AAS without acute findings [JK]    Clinical Course User Index [JK] Dorie Rank, MD   MDM Rules/Calculators/A&P                          Patient presented to the ED with complaints of recurrent abdominal pain and vomiting.  Patient has a history of gastroparesis and recurrent episodes of vomiting and abdominal pain.  Patient is presenting with similar episode.  No signs of obstruction hepatitis or pancreatitis during evaluation today.  No signs of DKA.  Patient was treated with IV fluids as well as antiemetics and pain medications.  Unfortunately her symptoms persist.  She is not able to take p.o. and continues to have pain.  She remains tachycardic and uncomfortable.  I will consult the medical service for further evaluation and treatment. Final Clinical Impression(s) / ED Diagnoses Final diagnoses:  Gastroparesis     Dorie Rank, MD 05/09/20 2043

## 2020-05-10 ENCOUNTER — Encounter (HOSPITAL_COMMUNITY): Payer: Self-pay | Admitting: Family Medicine

## 2020-05-10 DIAGNOSIS — E1143 Type 2 diabetes mellitus with diabetic autonomic (poly)neuropathy: Secondary | ICD-10-CM | POA: Diagnosis not present

## 2020-05-10 DIAGNOSIS — K3184 Gastroparesis: Secondary | ICD-10-CM | POA: Diagnosis not present

## 2020-05-10 DIAGNOSIS — R1084 Generalized abdominal pain: Secondary | ICD-10-CM

## 2020-05-10 DIAGNOSIS — I10 Essential (primary) hypertension: Secondary | ICD-10-CM

## 2020-05-10 DIAGNOSIS — R000 Tachycardia, unspecified: Secondary | ICD-10-CM

## 2020-05-10 LAB — URINALYSIS, ROUTINE W REFLEX MICROSCOPIC
Bilirubin Urine: NEGATIVE
Glucose, UA: >=500 mg/dL — AB
Hgb urine dipstick: NEGATIVE
Ketones, ur: 20 mg/dL — AB
Leukocytes,Ua: NEGATIVE
Nitrite: NEGATIVE
Protein, ur: 100 mg/dL — AB
Specific Gravity, Urine: 1.022 (ref 1.005–1.030)
pH: 5 (ref 5.0–8.0)

## 2020-05-10 LAB — CBC WITH DIFFERENTIAL/PLATELET
Abs Immature Granulocytes: 0.05 10*3/uL (ref 0.00–0.07)
Basophils Absolute: 0 10*3/uL (ref 0.0–0.1)
Basophils Relative: 0 %
Eosinophils Absolute: 0 10*3/uL (ref 0.0–0.5)
Eosinophils Relative: 0 %
HCT: 31.7 % — ABNORMAL LOW (ref 36.0–46.0)
Hemoglobin: 10.3 g/dL — ABNORMAL LOW (ref 12.0–15.0)
Immature Granulocytes: 0 %
Lymphocytes Relative: 28 %
Lymphs Abs: 3.8 10*3/uL (ref 0.7–4.0)
MCH: 29.9 pg (ref 26.0–34.0)
MCHC: 32.5 g/dL (ref 30.0–36.0)
MCV: 92.2 fL (ref 80.0–100.0)
Monocytes Absolute: 0.9 10*3/uL (ref 0.1–1.0)
Monocytes Relative: 7 %
Neutro Abs: 8.6 10*3/uL — ABNORMAL HIGH (ref 1.7–7.7)
Neutrophils Relative %: 65 %
Platelets: 320 10*3/uL (ref 150–400)
RBC: 3.44 MIL/uL — ABNORMAL LOW (ref 3.87–5.11)
RDW: 13.3 % (ref 11.5–15.5)
WBC: 13.4 10*3/uL — ABNORMAL HIGH (ref 4.0–10.5)
nRBC: 0 % (ref 0.0–0.2)

## 2020-05-10 LAB — GLUCOSE, CAPILLARY
Glucose-Capillary: 119 mg/dL — ABNORMAL HIGH (ref 70–99)
Glucose-Capillary: 171 mg/dL — ABNORMAL HIGH (ref 70–99)
Glucose-Capillary: 186 mg/dL — ABNORMAL HIGH (ref 70–99)
Glucose-Capillary: 222 mg/dL — ABNORMAL HIGH (ref 70–99)
Glucose-Capillary: 273 mg/dL — ABNORMAL HIGH (ref 70–99)
Glucose-Capillary: 295 mg/dL — ABNORMAL HIGH (ref 70–99)

## 2020-05-10 LAB — BASIC METABOLIC PANEL
Anion gap: 11 (ref 5–15)
BUN: 27 mg/dL — ABNORMAL HIGH (ref 6–20)
CO2: 23 mmol/L (ref 22–32)
Calcium: 8.2 mg/dL — ABNORMAL LOW (ref 8.9–10.3)
Chloride: 101 mmol/L (ref 98–111)
Creatinine, Ser: 1.1 mg/dL — ABNORMAL HIGH (ref 0.44–1.00)
GFR, Estimated: >60 mL/min (ref >60)
Glucose, Bld: 260 mg/dL — ABNORMAL HIGH (ref 70–99)
Potassium: 4.3 mmol/L (ref 3.5–5.1)
Sodium: 135 mmol/L (ref 135–145)

## 2020-05-10 LAB — HEMOGLOBIN A1C
Hgb A1c MFr Bld: 11.5 % — ABNORMAL HIGH (ref 4.8–5.6)
Mean Plasma Glucose: 283.35 mg/dL

## 2020-05-10 LAB — SARS CORONAVIRUS 2 (TAT 6-24 HRS): SARS Coronavirus 2: NEGATIVE

## 2020-05-10 NOTE — Progress Notes (Signed)
PROGRESS NOTE    Kelli ReichmannJazmyn Hudson  WUJ:[id number] DOB: 10-31-1990 DOA: 05/09/2020 PCP: Mliss SaxKremer, William Alfred, MD  Brief Narrative: The patient is a 30 year old African-American female with a past medical history significant for but not limited to diabetes mellitus type 1 with gastroparesis, hypertension, history of H. pylori infection, history of hyperthyroidism, history of GERD, history of gastroparesis as well as other comorbidities including amputation of the left fifth toe who presented for the evaluation of nausea vomiting as well as abdominal pain.  She presented to the emergency room with complaint of diffuse abdominal pain and persistent nausea vomiting that began in the morning and she does not remember any inciting event.  She states that she has had too numerous to count episodes of nausea vomiting has not had any coffee ground emesis with the vomiting but continued to have diffuse severe abdominal pain that did not radiate into her chest or pelvis.  She denied any diarrhea but she stated that she did notice some blood tinged discoloration in her emesis after several episodes of vomiting.  She is not having any fevers and does have a history of this in the past for which she has been hospitalized.  She has been given the working diagnosis of diabetic gastroparesis however has not been formally tested for this and her gastric emptying study to be done next week.  She feels that this is similar to her previous episodes and she is recently evaluated by [facility] was prescribed a medication last week which she does not remember but states that it did not help very much.  In the ED she is found to have a normal WBC and a normal x-ray of her abdomen.  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  -Pt is continued on basal insulin and currently on 13 units subcu nightly. Initiated on Moderate NovoLog sliding scale insulin AC for glycemic control. -Checked hemoglobin A1c and was 11.5 -Patient with known history of gastroparesis was given dose of Haldol and Reglan in the emergency room but continues to have symptoms. -To monitor CBGs per protocol and CBGs have been ranging from 119-295; initial blood sugars less than 200 -Patient has recently seen gastroenterology as an outpatient and states she was given a medication but she does not member the name and it was not very helpful she states. -She may need formal gastroenterology consultation but will hold off for now as her symptoms seem to be improving  Intractable Nausea and Vomiting  -C/w Antiemetics with po/IV Ondansetron q6hprn Nausea. -IV fluid hydration with normal saline overnight and we reduce this to 100 mL/h -She was given metoclopramide for nausea and vomiting refractory to ondansetron -Initially placed on a clear liquid diet and will advance to a full liquid diet and go to soft tomorrow if she is improved  Essential Hypertension -Continue home medications Atenolol 25 mg po BID and Lisinopril 5 mg po Daily -Continue to Monitor BP per protocol -Last BP was   Renal Insufficiency -Patient's BUN/Cr  went from 27/0.95 -> 27/1.10 -IVF Hydration as below -Avoid Nephrotoxic Medications, Contrast Dyes, Hypotension and Renally adjust Medications -Repeat CMP in the AM   Normocytic Anemia -Patient's Hgb/Hct went from 12.2/37.0 -> 10.3/31.7 -Likely Dilutional Drop -Check Anemia Panel in the AM  -Continue to Monitor for S/Sx of Bleeding; No  overt bleeding noted but she did have some blood in vomiting previously -If drops further will need a formal GI Consult -Repeat CBC in the AM   Abdominal Pain -In the setting of Suspected Gastroparesis  -Patient was given Hydromorphone 1 mg x1 overnight and a dose of IV Morphine 4 mg; She had 1mg  of IV Hyrdromorphone q3hprn Severe Pain -Pt has hx of pylori infection and she states she has had ulcers in the past.  - Will obtain CT of the abdomen to make sure there is not any acute pathology; CT Abd/Pelvis showed "No acute abdominal/pelvic findings, mass lesions or adenopathy. Marked distention of the bladder almost up to the umbilicus. Bladder volume is estimated at 1.1 L." - X-ray series done in the emergency room was negative and showed "Negative abdominal radiographs.  No acute cardiopulmonary disease."  Tachycardia -Patient with Sinus Tachycardia secondary to pain and probable volume loss with persistent nausea and vomiting today. -IV fluid hydration provided and she is getting 150 mL of normal saline per hour but will change this to 100 and mils per hour for now; Received a bolus of NS 1,000 mL in the ED -Continue to Monitor on Telemetry  Acute Urinary Retention -CT scan yesterday showed that the bladder volume was estimated to be 1.1 L -Bladder scan done this morning showed the patient was retaining 450 mL  -INO cath done and she may need a Foley catheter -Check a urinalysis and urine culture  -We will check bladder scans every 6h -Continue monitor PVRs  Leukocytosis -In the setting of nausea vomiting -Continue monitor carefully her she was also holding onto her urine and retaining -CT of the abdomen and pelvis showed "No acute abdominal/pelvic findings, mass lesions or adenopathy. Marked distention of the bladder almost up to the umbilicus. Bladder volume is estimated at 1.1 L." -Continue to monitor for signs and symptoms of infection and currently holding off on antibiotics for  now -WBC went from 9.8 is now 13.4  -Repeat CBC in a.m.  DVT prophylaxis: SCDs Code Status: FULL CODE  Family Communication: No family present at bedside  Disposition Plan: Pending further clinical improvement and tolerance of Diet   Status is: Observation  The patient will require care spanning > 2 midnights and should be moved to inpatient because: Unsafe d/c plan, IV treatments appropriate due to intensity of illness or inability to take PO and Inpatient level of care appropriate due to severity of illness  Dispo: The patient is from: Home              Anticipated d/c is to: Home              Patient currently is not medically stable to d/c.   Difficult to place patient No  Consultants:   None   Procedures: None  Antimicrobials:  Anti-infectives (From admission, onward)   None        Subjective: Seen and examined at bedside and she was feeling a little bit better today but still had some mild nausea.  No vomiting now.  Denies any lightheadedness or dizziness.  States that she has been given the working diagnosis of diabetic gastroparesis but has  not had a gastric emptying study.  No other concerns or complaints this time but nursing reported her bladder scan showed that she is retaining.  Objective: Vitals:   05/10/20 0033 05/10/20 0545 05/10/20 0844 05/10/20 1444  BP: 108/69 (!) 97/59 110/82 124/84  Pulse: (!) 102 91 92 96  Resp: 18 18 14 16   Temp: 97.9 F (36.6 C) 98.1 F (36.7 C) 98.1 F (36.7 C) 98.4 F (36.9 C)  TempSrc: Oral Oral Oral Oral  SpO2: 100% 100% 100% 98%  Weight:      Height:        Intake/Output Summary (Last 24 hours) at 05/10/2020 1627 Last data filed at 05/10/2020 1154 Gross per 24 hour  Intake 2738.39 ml  Output 325 ml  Net 2413.39 ml   Filed Weights   05/09/20 1722  Weight: 72.6 kg   Examination: Physical Exam:  Constitutional: WN/WD AAF in NAD and appears calm  Eyes: Lids and conjunctivae normal, sclerae anicteric  ENMT:  External Ears, Nose appear normal. Grossly normal hearing.  Neck: Appears normal, supple, no cervical masses, normal ROM, no appreciable thyromegaly; no JVD Respiratory: Clear to auscultation bilaterally, no wheezing, rales, rhonchi or crackles. Normal respiratory effort and patient is not tachypenic. No accessory muscle use. Unlabored Breathing  Cardiovascular: RRR, no murmurs / rubs / gallops. S1 and S2 auscultated. No extremity edema. Abdomen: Soft, mildly tender, non-distended. Bowel sounds positive.  GU: Deferred. Musculoskeletal: No clubbing / cyanosis of digits/nails. No joint deformity upper and lower extremities.  Skin: No rashes, lesions, ulcers on a limited skin evaluation. No induration; Warm and dry.  Neurologic: CN 2-12 grossly intact with no focal deficits. Romberg sign and cerebellar reflexes not assessed.  Psychiatric: Normal judgment and insight. Alert and oriented x 3. Normal mood and appropriate affect.   Data Reviewed: I have personally reviewed following labs and imaging studies  CBC: Recent Labs  Lab 05/09/20 1728 05/10/20 0900  WBC 9.8 13.4*  NEUTROABS  --  8.6*  HGB 12.2 10.3*  HCT 37.0 31.7*  MCV 89.4 92.2  PLT 402* 320   Basic Metabolic Panel: Recent Labs  Lab 05/09/20 1728 05/10/20 0457  NA 138 135  K 4.0 4.3  CL 98 101  CO2 27 23  GLUCOSE 264* 260*  BUN 27* 27*  CREATININE 0.95 1.10*  CALCIUM 9.8 8.2*   GFR: Estimated Creatinine Clearance: 76.8 mL/min (A) (by C-G formula based on SCr of 1.1 mg/dL (H)). Liver Function Tests: Recent Labs  Lab 05/09/20 1728  AST 31  ALT 36  ALKPHOS 81  BILITOT 0.8  PROT 7.8  ALBUMIN 3.9   Recent Labs  Lab 05/09/20 1728  LIPASE 25   No results for input(s): AMMONIA in the last 168 hours. Coagulation Profile: No results for input(s): INR, PROTIME in the last 168 hours. Cardiac Enzymes: No results for input(s): CKTOTAL, CKMB, CKMBINDEX, TROPONINI in the last 168 hours. BNP (last 3 results) No  results for input(s): PROBNP in the last 8760 hours. HbA1C: Recent Labs    05/09/20 1728  HGBA1C 11.5*   CBG: Recent Labs  Lab 05/10/20 0023 05/10/20 0543 05/10/20 0907 05/10/20 1127  GLUCAP 295* 222* 171* 119*   Lipid Profile: No results for input(s): CHOL, HDL, LDLCALC, TRIG, CHOLHDL, LDLDIRECT in the last 72 hours. Thyroid Function Tests: No results for input(s): TSH, T4TOTAL, FREET4, T3FREE, THYROIDAB in the last 72 hours. Anemia Panel: No results for input(s): VITAMINB12, FOLATE, FERRITIN, TIBC, IRON, RETICCTPCT in the last 72 hours. Sepsis  Labs: No results for input(s): PROCALCITON, LATICACIDVEN in the last 168 hours.  Recent Results (from the past 240 hour(s))  SARS CORONAVIRUS 2 (TAT 6-24 HRS) Nasopharyngeal Nasopharyngeal Swab     Status: None   Collection Time: 05/09/20  8:53 PM   Specimen: Nasopharyngeal Swab  Result Value Ref Range Status   SARS Coronavirus 2 NEGATIVE NEGATIVE Final    Comment: (NOTE) SARS-CoV-2 target nucleic acids are NOT DETECTED.  The SARS-CoV-2 RNA is generally detectable in upper and lower respiratory specimens during the acute phase of infection. Negative results do not preclude SARS-CoV-2 infection, do not rule out co-infections with other pathogens, and should not be used as the sole basis for treatment or other patient management decisions. Negative results must be combined with clinical observations, patient history, and epidemiological information. The expected result is Negative.  Fact Sheet for Patients: HairSlick.no  Fact Sheet for Healthcare Providers: quierodirigir.com  This test is not yet approved or cleared by the [place] FDA and  has been authorized for detection and/or diagnosis of SARS-CoV-2 by FDA under an Emergency Use Authorization (EUA). This EUA will remain  in effect (meaning this test can be used) for the duration of the COVID-19 declaration under  Se ction 564(b)(1) of the Act, 21 U.S.C. section 360bbb-3(b)(1), unless the authorization is terminated or revoked sooner.  Performed at [facility], [address], [place], [place] [address]     RN Pressure Injury Documentation:     Estimated body mass index is 24.33 kg/m as calculated from the following:   Height as of this encounter: 5\' 8"  (1.727 m).   Weight as of this encounter: 72.6 kg.  Malnutrition Type:   Malnutrition Characteristics:   Nutrition Interventions:   Radiology Studies: CT ABDOMEN PELVIS W CONTRAST  Result Date: 05/09/2020 CLINICAL DATA:  Abdominal pain since this morning. EXAM: CT ABDOMEN AND PELVIS WITH CONTRAST TECHNIQUE: Multidetector CT imaging of the abdomen and pelvis was performed using the standard protocol following bolus administration of intravenous contrast. CONTRAST:  05/11/2020 OMNIPAQUE IOHEXOL 300 MG/ML  SOLN COMPARISON:  01/24/2020 FINDINGS: Lower chest: Insert lung bases Hepatobiliary: No hepatic lesions or intrahepatic biliary dilatation. The gallbladder appears normal. No common bile duct dilatation. Pancreas: No mass, inflammation or ductal dilatation. Spleen: Normal size.  No focal lesions. Adrenals/Urinary Tract: Adrenal glands and kidneys are unremarkable. No renal mass, renal calculi or findings for pyelonephritis. There is fairly marked distention of the bladder almost up to the umbilicus. Bladder volume is estimated at 1.1 L. no bladder mass, bladder calculi or asymmetric bladder wall thickening. Stomach/Bowel: The stomach, duodenum, small bowel and colon are grossly normal. No acute inflammatory changes, mass lesions or obstructive findings. The terminal ileum is normal. The appendix is normal. Vascular/Lymphatic: The aorta is normal in caliber. No dissection. The branch vessels are patent. The major venous structures are patent. No mesenteric or retroperitoneal mass or adenopathy. Small scattered lymph nodes are noted. Reproductive: The  uterus and ovaries are unremarkable. Other: No pelvic mass or adenopathy. No free pelvic fluid collections. No inguinal mass or adenopathy. No abdominal wall hernia or subcutaneous lesions. Musculoskeletal: No significant bony findings. IMPRESSION: 1. No acute abdominal/pelvic findings, mass lesions or adenopathy. 2. Marked distention of the bladder almost up to the umbilicus. Bladder volume is estimated at 1.1 L. Electronically Signed   By: 14/08/2019 M.D.   On: 05/09/2020 21:57   DG Abdomen Acute W/Chest  Result Date: 05/09/2020 CLINICAL DATA:  Nausea vomiting abdominal pain  EXAM: DG ABDOMEN ACUTE WITH 1 VIEW CHEST COMPARISON:  02/25/2020 FINDINGS: There is no evidence of dilated bowel loops or free intraperitoneal air. No radiopaque calculi or other significant radiographic abnormality is seen. Heart size and mediastinal contours are within normal limits. Both lungs are clear. IMPRESSION: Negative abdominal radiographs.  No acute cardiopulmonary disease. Electronically Signed   By: Marlan Palau M.D.   On: 05/09/2020 20:28   Scheduled Meds: . atenolol  25 mg Oral BID  . insulin aspart  0-15 Units Subcutaneous TID WC  . insulin glargine  13 Units Subcutaneous QHS  . lisinopril  5 mg Oral Daily   Continuous Infusions: . sodium chloride Stopped (05/09/20 2147)  . sodium chloride 150 mL/hr at 05/10/20 1553    LOS: 0 days   Merlene Laughter, DO [facility] PAGER is on AMION  If 7PM-7AM, please contact night-coverage [email or web address]

## 2020-05-11 ENCOUNTER — Inpatient Hospital Stay (HOSPITAL_COMMUNITY): Payer: 59

## 2020-05-11 DIAGNOSIS — R1013 Epigastric pain: Secondary | ICD-10-CM | POA: Diagnosis not present

## 2020-05-11 DIAGNOSIS — E871 Hypo-osmolality and hyponatremia: Secondary | ICD-10-CM | POA: Diagnosis not present

## 2020-05-11 DIAGNOSIS — R339 Retention of urine, unspecified: Secondary | ICD-10-CM | POA: Diagnosis present

## 2020-05-11 DIAGNOSIS — Z89422 Acquired absence of other left toe(s): Secondary | ICD-10-CM | POA: Diagnosis not present

## 2020-05-11 DIAGNOSIS — Z794 Long term (current) use of insulin: Secondary | ICD-10-CM | POA: Diagnosis not present

## 2020-05-11 DIAGNOSIS — Z8619 Personal history of other infectious and parasitic diseases: Secondary | ICD-10-CM | POA: Diagnosis not present

## 2020-05-11 DIAGNOSIS — Z833 Family history of diabetes mellitus: Secondary | ICD-10-CM | POA: Diagnosis not present

## 2020-05-11 DIAGNOSIS — R1110 Vomiting, unspecified: Secondary | ICD-10-CM

## 2020-05-11 DIAGNOSIS — D649 Anemia, unspecified: Secondary | ICD-10-CM

## 2020-05-11 DIAGNOSIS — E0590 Thyrotoxicosis, unspecified without thyrotoxic crisis or storm: Secondary | ICD-10-CM | POA: Diagnosis present

## 2020-05-11 DIAGNOSIS — R112 Nausea with vomiting, unspecified: Secondary | ICD-10-CM | POA: Diagnosis not present

## 2020-05-11 DIAGNOSIS — K219 Gastro-esophageal reflux disease without esophagitis: Secondary | ICD-10-CM | POA: Diagnosis present

## 2020-05-11 DIAGNOSIS — Z20822 Contact with and (suspected) exposure to covid-19: Secondary | ICD-10-CM | POA: Diagnosis present

## 2020-05-11 DIAGNOSIS — R1084 Generalized abdominal pain: Secondary | ICD-10-CM | POA: Diagnosis not present

## 2020-05-11 DIAGNOSIS — E1143 Type 2 diabetes mellitus with diabetic autonomic (poly)neuropathy: Secondary | ICD-10-CM | POA: Diagnosis present

## 2020-05-11 DIAGNOSIS — Z8711 Personal history of peptic ulcer disease: Secondary | ICD-10-CM | POA: Diagnosis not present

## 2020-05-11 DIAGNOSIS — R17 Unspecified jaundice: Secondary | ICD-10-CM | POA: Diagnosis not present

## 2020-05-11 DIAGNOSIS — N179 Acute kidney failure, unspecified: Secondary | ICD-10-CM | POA: Diagnosis present

## 2020-05-11 DIAGNOSIS — K3184 Gastroparesis: Secondary | ICD-10-CM | POA: Diagnosis present

## 2020-05-11 DIAGNOSIS — D72829 Elevated white blood cell count, unspecified: Secondary | ICD-10-CM

## 2020-05-11 DIAGNOSIS — Z87440 Personal history of urinary (tract) infections: Secondary | ICD-10-CM | POA: Diagnosis not present

## 2020-05-11 DIAGNOSIS — E0865 Diabetes mellitus due to underlying condition with hyperglycemia: Secondary | ICD-10-CM | POA: Diagnosis not present

## 2020-05-11 DIAGNOSIS — I10 Essential (primary) hypertension: Secondary | ICD-10-CM | POA: Diagnosis present

## 2020-05-11 DIAGNOSIS — Z79899 Other long term (current) drug therapy: Secondary | ICD-10-CM | POA: Diagnosis not present

## 2020-05-11 DIAGNOSIS — E1165 Type 2 diabetes mellitus with hyperglycemia: Secondary | ICD-10-CM | POA: Diagnosis present

## 2020-05-11 LAB — COMPREHENSIVE METABOLIC PANEL
ALT: 28 U/L (ref 0–44)
AST: 28 U/L (ref 15–41)
Albumin: 3 g/dL — ABNORMAL LOW (ref 3.5–5.0)
Alkaline Phosphatase: 54 U/L (ref 38–126)
Anion gap: 8 (ref 5–15)
BUN: 14 mg/dL (ref 6–20)
CO2: 23 mmol/L (ref 22–32)
Calcium: 8.6 mg/dL — ABNORMAL LOW (ref 8.9–10.3)
Chloride: 105 mmol/L (ref 98–111)
Creatinine, Ser: 0.59 mg/dL (ref 0.44–1.00)
GFR, Estimated: >60 mL/min (ref >60)
Glucose, Bld: 189 mg/dL — ABNORMAL HIGH (ref 70–99)
Potassium: 3.8 mmol/L (ref 3.5–5.1)
Sodium: 136 mmol/L (ref 135–145)
Total Bilirubin: 0.9 mg/dL (ref 0.3–1.2)
Total Protein: 6.1 g/dL — ABNORMAL LOW (ref 6.5–8.1)

## 2020-05-11 LAB — CBC WITH DIFFERENTIAL/PLATELET
Abs Immature Granulocytes: 0.02 10*3/uL (ref 0.00–0.07)
Basophils Absolute: 0 10*3/uL (ref 0.0–0.1)
Basophils Relative: 1 %
Eosinophils Absolute: 0.1 10*3/uL (ref 0.0–0.5)
Eosinophils Relative: 2 %
HCT: 31.6 % — ABNORMAL LOW (ref 36.0–46.0)
Hemoglobin: 10.4 g/dL — ABNORMAL LOW (ref 12.0–15.0)
Immature Granulocytes: 0 %
Lymphocytes Relative: 48 %
Lymphs Abs: 4 10*3/uL (ref 0.7–4.0)
MCH: 30.4 pg (ref 26.0–34.0)
MCHC: 32.9 g/dL (ref 30.0–36.0)
MCV: 92.4 fL (ref 80.0–100.0)
Monocytes Absolute: 0.6 10*3/uL (ref 0.1–1.0)
Monocytes Relative: 7 %
Neutro Abs: 3.4 10*3/uL (ref 1.7–7.7)
Neutrophils Relative %: 42 %
Platelets: 318 10*3/uL (ref 150–400)
RBC: 3.42 MIL/uL — ABNORMAL LOW (ref 3.87–5.11)
RDW: 13 % (ref 11.5–15.5)
WBC: 8.2 10*3/uL (ref 4.0–10.5)
nRBC: 0 % (ref 0.0–0.2)

## 2020-05-11 LAB — VITAMIN B12: Vitamin B-12: 547 pg/mL (ref 180–914)

## 2020-05-11 LAB — URINE CULTURE: Culture: >=100000 — AB

## 2020-05-11 LAB — GLUCOSE, CAPILLARY
Glucose-Capillary: 125 mg/dL — ABNORMAL HIGH (ref 70–99)
Glucose-Capillary: 176 mg/dL — ABNORMAL HIGH (ref 70–99)
Glucose-Capillary: 179 mg/dL — ABNORMAL HIGH (ref 70–99)
Glucose-Capillary: 180 mg/dL — ABNORMAL HIGH (ref 70–99)
Glucose-Capillary: 183 mg/dL — ABNORMAL HIGH (ref 70–99)
Glucose-Capillary: 183 mg/dL — ABNORMAL HIGH (ref 70–99)
Glucose-Capillary: 191 mg/dL — ABNORMAL HIGH (ref 70–99)

## 2020-05-11 LAB — RETICULOCYTES
Immature Retic Fract: 15.7 % (ref 2.3–15.9)
RBC.: 3.42 MIL/uL — ABNORMAL LOW (ref 3.87–5.11)
Retic Count, Absolute: 80.7 10*3/uL (ref 19.0–186.0)
Retic Ct Pct: 2.4 % (ref 0.4–3.1)

## 2020-05-11 LAB — TROPONIN I (HIGH SENSITIVITY)
Troponin I (High Sensitivity): <2 ng/L (ref <18)
Troponin I (High Sensitivity): <2 ng/L (ref <18)

## 2020-05-11 LAB — IRON AND TIBC
Iron: 99 ug/dL (ref 28–170)
Saturation Ratios: 22 % (ref 10.4–31.8)
TIBC: 458 ug/dL — ABNORMAL HIGH (ref 250–450)
UIBC: 359 ug/dL

## 2020-05-11 LAB — PHOSPHORUS: Phosphorus: 2.8 mg/dL (ref 2.5–4.6)

## 2020-05-11 LAB — FERRITIN: Ferritin: 27 ng/mL (ref 11–307)

## 2020-05-11 LAB — FOLATE: Folate: 21 ng/mL (ref >5.9)

## 2020-05-11 LAB — MAGNESIUM: Magnesium: 1.8 mg/dL (ref 1.7–2.4)

## 2020-05-11 MED ORDER — MAGNESIUM SULFATE 2 GM/50ML IV SOLN
2.0000 g | Freq: Once | INTRAVENOUS | Status: AC
  Administered 2020-05-11: 2 g via INTRAVENOUS
  Filled 2020-05-11: qty 50

## 2020-05-11 MED ORDER — PANTOPRAZOLE SODIUM 40 MG IV SOLR
40.0000 mg | INTRAVENOUS | Status: DC
  Administered 2020-05-11: 40 mg via INTRAVENOUS
  Filled 2020-05-11: qty 40

## 2020-05-11 MED ORDER — HYDROMORPHONE HCL 1 MG/ML IJ SOLN
0.5000 mg | INTRAMUSCULAR | Status: AC | PRN
Start: 2020-05-11 — End: 2020-05-12
  Administered 2020-05-11 – 2020-05-12 (×5): 0.5 mg via INTRAVENOUS
  Filled 2020-05-11 (×5): qty 0.5

## 2020-05-11 MED ORDER — HYDROMORPHONE HCL 1 MG/ML IJ SOLN
1.0000 mg | INTRAMUSCULAR | Status: DC | PRN
Start: 2020-05-11 — End: 2020-05-11
  Administered 2020-05-11: 1 mg via INTRAVENOUS
  Filled 2020-05-11: qty 1

## 2020-05-11 MED ORDER — METOCLOPRAMIDE HCL 5 MG/ML IJ SOLN
10.0000 mg | Freq: Three times a day (TID) | INTRAMUSCULAR | Status: DC
  Administered 2020-05-11 – 2020-05-14 (×10): 10 mg via INTRAVENOUS
  Filled 2020-05-11 (×10): qty 2

## 2020-05-11 MED ORDER — ALUM & MAG HYDROXIDE-SIMETH 200-200-20 MG/5ML PO SUSP
30.0000 mL | Freq: Once | ORAL | Status: AC
  Administered 2020-05-11: 30 mL via ORAL
  Filled 2020-05-11: qty 30

## 2020-05-11 MED ORDER — ONDANSETRON HCL 4 MG/2ML IJ SOLN
4.0000 mg | Freq: Four times a day (QID) | INTRAMUSCULAR | Status: DC
  Administered 2020-05-11 – 2020-05-14 (×10): 4 mg via INTRAVENOUS
  Filled 2020-05-11 (×10): qty 2

## 2020-05-11 MED ORDER — SODIUM CHLORIDE 0.9 % IV SOLN
250.0000 mg | Freq: Three times a day (TID) | INTRAVENOUS | Status: DC
  Administered 2020-05-11 – 2020-05-14 (×10): 250 mg via INTRAVENOUS
  Filled 2020-05-11 (×14): qty 5

## 2020-05-11 MED ORDER — GABAPENTIN 300 MG PO CAPS
300.0000 mg | ORAL_CAPSULE | Freq: Once | ORAL | Status: AC
  Administered 2020-05-11: 300 mg via ORAL
  Filled 2020-05-11: qty 1

## 2020-05-11 MED ORDER — ONDANSETRON HCL 4 MG PO TABS
4.0000 mg | ORAL_TABLET | Freq: Four times a day (QID) | ORAL | Status: DC
  Administered 2020-05-12: 4 mg via ORAL
  Filled 2020-05-11 (×3): qty 1

## 2020-05-11 MED ORDER — HYOSCYAMINE SULFATE ER 0.375 MG PO TB12
0.3750 mg | ORAL_TABLET | Freq: Two times a day (BID) | ORAL | Status: DC | PRN
  Administered 2020-05-11 – 2020-05-13 (×2): 0.375 mg via ORAL
  Filled 2020-05-11 (×3): qty 1

## 2020-05-11 MED ORDER — SODIUM CHLORIDE 0.9 % IV BOLUS
1000.0000 mL | Freq: Once | INTRAVENOUS | Status: AC
  Administered 2020-05-11: 1000 mL via INTRAVENOUS

## 2020-05-11 MED ORDER — PANTOPRAZOLE SODIUM 40 MG IV SOLR
40.0000 mg | Freq: Two times a day (BID) | INTRAVENOUS | Status: DC
  Administered 2020-05-11 – 2020-05-14 (×6): 40 mg via INTRAVENOUS
  Filled 2020-05-11 (×6): qty 40

## 2020-05-11 NOTE — Progress Notes (Signed)
Inpatient Diabetes Program Recommendations  AACE/ADA: New Consensus Statement on Inpatient Glycemic Control (2015)  Target Ranges:  Prepandial:   less than 140 mg/dL      Peak postprandial:   less than 180 mg/dL (1-2 hours)      Critically ill patients:  140 - 180 mg/dL   Lab Results  Component Value Date   GLUCAP 180 (H) 05/11/2020   HGBA1C 11.5 (H) 05/09/2020    Review of Glycemic Control  Diabetes history: DM1 Outpatient Diabetes medications: Tresiba 13 units QHS Current orders for Inpatient glycemic control: Lantus 13 units QHS, Novolog 0-15 units TID with meals  HgbA1C - 11.5%  Spoke with pt at bedside regarding her HgbA1C of 11.5% (average blood sugar 283 mg/dL). Pt states she takes the [place] 13 units QHS, but does not take any rapid-acting insulin. States she does not use the Novolin R at work. Asked why she didn't use Novolog or Humalog and she said it was too expensive even though she has insurance. Will ask TOC to check copay for Novolog or Humalog pens. Said she knows this is why her HgbA1C is so elevated. Said she would f/u with her PCP, start monitoring blood sugars regularly several times/day and reduce HgbA1C to around 7%. Explained that gastroparesis will improve with tighter glycemic control.  Inpatient Diabetes Program Recommendations:     Agree with orders.  Would d/c home on rapid-acting insulin. TOC consult for copay info.  Continue to follow.  Thank you. Ailene Ards, RD, LDN, CDE Inpatient Diabetes Coordinator [phone]

## 2020-05-11 NOTE — Consult Note (Signed)
Consultation  Referring Provider: [facility]/ Alfredia Ferguson Primary Care Physician:  Libby Maw, MD Primary Gastroenterologist:  Dr.Armbruster  Reason for Consultation:  Nausea, vomiting , abdominal  pain  HPI: Kelli Hudson is a 30 y.o. female , who was seen in our office on 04/20/2020 initially in consult for intermittent refractory nausea and vomiting.  She had been referred after a recent hospitalization 2/15 through 04/09/2020 with nausea and vomiting and abdominal pain. She has history of poorly controlled insulin-dependent diabetes mellitus with most recent hemoglobin A1c in January 2022 at 10.8. She had CT imaging in December 2021 which was negative, and also had EGD in December 2021 per Dr. Alessandra Bevels when she was also hospitalized with nausea and vomiting and that showed mild gastritis which was H. pylori negative. At the time she was seen in the office she was significantly improved on Reglan 5 mg AC and Protonix daily.  We initiated a gastroparesis diet and she has Zofran to use ODT on a as needed basis.  Gastric emptying scan was scheduled and that is planned for 05/16/2020  She says she has been doing well over the past couple of weeks and had been continuing the medications as instructed and had not run of out of any medicines.  She says she is not sure what triggered this as nothing had changed but she started having nausea vomiting and abdominal pain again on 05/09/2020 very similar to her prior episodes. She felt a little bit better yesterday and is again feeling poorly today.  She has been started on IV Reglan and as needed Zofran.  Her blood sugars at home have been in the high 200s at times. CT on readmit 05/10/2019 showed marked bladder distention to the umbilicus and was an otherwise negative exam. She had in and out cath with 1.1 L removed and says she has been urinating okay since Urine cultures pending On admit WBC of 13.4 hemoglobin 10.3 hematocrit of 31.7 glucose  264 LFTs within normal limits Ferritin 27/serum iron 91 TIBC 458/iron sat 22 No EtOH use and no cannabis use.   Past Medical History:  Diagnosis Date  . Acute H. pylori gastric ulcer   . Diabetes mellitus ([place])   . Diabetic gastroparesis ([place])   . Gastroparesis   . GERD (gastroesophageal reflux disease)   . Hypertension   . Hyperthyroidism     Past Surgical History:  Procedure Laterality Date  . AMPUTATION TOE Left 03/10/2018   Procedure: AMPUTATION FIFTH TOE;  Surgeon: Trula Slade, DPM;  Location: [place];  Service: Podiatry;  Laterality: Left;  . BIOPSY  01/28/2020   Procedure: BIOPSY;  Surgeon: Otis Brace, MD;  Location: [facility];  Service: Gastroenterology;;  . ESOPHAGOGASTRODUODENOSCOPY N/A 01/28/2020   Procedure: ESOPHAGOGASTRODUODENOSCOPY (EGD);  Surgeon: Otis Brace, MD;  Location: [facility];  Service: Gastroenterology;  Laterality: N/A;  . ESOPHAGOGASTRODUODENOSCOPY (EGD) WITH PROPOFOL Left 09/08/2015   Procedure: ESOPHAGOGASTRODUODENOSCOPY (EGD) WITH PROPOFOL;  Surgeon: Arta Silence, MD;  Location: [facility];  Service: Endoscopy;  Laterality: Left;  . WISDOM TOOTH EXTRACTION      Prior to Admission medications   Medication Sig Start Date End Date Taking? Authorizing Provider  atenolol (TENORMIN) 25 MG tablet Take 1 tablet (25 mg total) by mouth 2 (two) times daily. 01/30/20  Yes Mariel Aloe, MD  feeding supplement, ENSURE COMPLETE, (ENSURE COMPLETE) LIQD Take 237 mLs by mouth 2 (two) times daily between meals. 03/07/20  Yes Dutch Quint B, FNP  insulin degludec (  TRESIBA FLEXTOUCH) 100 UNIT/ML FlexTouch Pen Inject 13 Units into the skin at bedtime.   Yes [provider]  insulin regular (NOVOLIN R) 100 units/mL injection Inject 0.05 mLs (5 Units total) into the skin 3 (three) times daily before meals. Patient taking differently: Inject 0-5 Units into the skin 3 (three) times daily before meals. Sliding scale  04/11/20  Yes [name], MD  lisinopril (ZESTRIL) 5 MG tablet Take 5 mg by mouth daily.  10/17/19  Yes [provider]  metoCLOPramide (REGLAN) 5 MG tablet Take 1 tablet (5 mg total) by mouth 3 (three) times daily before meals. 04/20/20  Yes Esterwood, Amy S, PA-C  Multiple Vitamin (MULTIVITAMIN WITH MINERALS) TABS tablet Take 1 tablet by mouth daily.   Yes [provider]  ondansetron (ZOFRAN ODT) 4 MG disintegrating tablet Take 1 tablet (4 mg total) by mouth every 6 (six) hours as needed for nausea or vomiting. 04/20/20  Yes Esterwood, Amy S, PA-C  pantoprazole (PROTONIX) 40 MG tablet Take 1 tablet (40 mg total) by mouth daily before breakfast. 04/20/20  Yes Esterwood, Amy S, PA-C  Blood Glucose Monitoring Suppl (TRUE METRIX METER) w/Device KIT 1 Device by Does not apply route 4 (four) times daily. 07/05/19   Libby Maw, MD  dicyclomine (BENTYL) 20 MG tablet Take 1 tablet (20 mg total) by mouth 4 (four) times daily -  before meals and at bedtime. Patient not taking: Reported on 05/09/2020 02/28/20   Margette Fast, MD  glucose blood (TRUE METRIX BLOOD GLUCOSE TEST) test strip Use as instructed 07/05/19   Libby Maw, MD  Insulin Pen Needle 31G X 5 MM MISC 1 Device by Does not apply route QID. For use with insulin pens 07/05/19   Libby Maw, MD  TRUEPLUS LANCETS 28G MISC assist with checking blood sugar TID and qhs 09/10/15   Zettie Pho S, PA-C  insulin aspart (NOVOLOG) 100 UNIT/ML FlexPen Inject 5 Units into the skin 3 (three) times daily with meals. 02/23/18 07/05/19  Donne Hazel, MD  promethazine (PHENERGAN) 25 MG tablet Take 1 tablet (25 mg total) by mouth every 8 (eight) hours as needed for nausea or vomiting. Patient not taking: Reported on 07/05/2019 03/10/18 10/04/19  Trula Slade, DPM    Current Facility-Administered Medications  Medication Dose Route Frequency Provider Last Rate Last Admin  . 0.9 %  sodium chloride infusion    Intravenous Continuous Raiford Noble [place], DO 100 mL/hr at 05/11/20 0703 New Bag at 05/11/20 0703  . acetaminophen (TYLENOL) tablet 650 mg  650 mg Oral Q6H PRN Chotiner, Yevonne Aline, MD   650 mg at 05/11/20 4742   Or  . acetaminophen (TYLENOL) suppository 650 mg  650 mg Rectal Q6H PRN Chotiner, Yevonne Aline, MD      . atenolol (TENORMIN) tablet 25 mg  25 mg Oral BID Chotiner, Yevonne Aline, MD   25 mg at 05/11/20 5956  . HYDROmorphone (DILAUDID) injection 1 mg  1 mg Intravenous Q3H PRN Raiford Noble Latif, DO   1 mg at 05/11/20 1049  . insulin aspart (novoLOG) injection 0-15 Units  0-15 Units Subcutaneous TID WC Chotiner, Yevonne Aline, MD   3 Units at 05/11/20 [phone]  . insulin glargine (LANTUS) injection 13 Units  13 Units Subcutaneous QHS Chotiner, Yevonne Aline, MD   13 Units at 05/10/20 2148  . lisinopril (ZESTRIL) tablet 5 mg  5 mg Oral Daily Chotiner, Yevonne Aline, MD   5 mg at 05/11/20 6433  .  metoCLOPramide (REGLAN) injection 10 mg  10 mg Intravenous Q8H Sheikh, Omair Latif, DO      . ondansetron [facility]) tablet 4 mg  4 mg Oral Q6H PRN Chotiner, Yevonne Aline, MD       Or  . ondansetron [facility]) injection 4 mg  4 mg Intravenous Q6H PRN Chotiner, Yevonne Aline, MD   4 mg at 05/11/20 2595    Allergies as of 05/09/2020  . (No Known Allergies)    Family History  Problem Relation Age of Onset  . Lung cancer Mother   . Diabetes Mother   . Bipolar disorder Father   . Liver cancer Maternal Grandfather   . Breast cancer Other        maternal great aunt  . Heart disease Other   . Ovarian cancer Other        maternal great aunt  . Pancreatic cancer Maternal Uncle   . Prostate cancer Maternal Uncle   . Diabetes Maternal Aunt        x 2  . Kidney disease Other        maternal great aunt    Social History   Socioeconomic History  . Marital status: Significant Other    Spouse name: Not on file  . Number of children: 0  . Years of education: Not on file  . Highest education level: Not on file  Occupational  History  . Occupation: Estate manager/land agent  Tobacco Use  . Smoking status: Never Smoker  . Smokeless tobacco: Never Used  Vaping Use  . Vaping Use: Never used  Substance and Sexual Activity  . Alcohol use: Yes    Alcohol/week: 1.0 standard drink    Types: 1 Shots of liquor per week    Comment: occasional   . Drug use: No  . Sexual activity: Yes    Birth control/protection: None, Condom  Other Topics Concern  . Not on file  Social History Narrative  . Not on file   Social Determinants of Health   Financial Resource Strain: Not on file  Food Insecurity: Not on file  Transportation Needs: Not on file  Physical Activity: Not on file  Stress: Not on file  Social Connections: Not on file  Intimate Partner Violence: Not on file    Review of Systems: Pertinent positive and negative review of systems were noted in the above HPI section.  All other review of systems was otherwise negative.[name]  Physical Exam: Vital signs in last 24 hours: Temp:  [98.4 F (36.9 C)-98.5 F (36.9 C)] 98.5 F (36.9 C) (03/24 2109) Pulse Rate:  [96] 96 (03/24 2109) Resp:  [16-20] 20 (03/24 2109) BP: (124-128)/(84-90) 128/90 (03/24 2109) SpO2:  [98 %-100 %] 100 % (03/24 2109) Last BM Date: 05/10/20 General:   Alert,  Well-developed, well-nourished, young African-American female cooperative in NAD-uncomfortable appearing, holding her abdomen and emesis basin Head:  Normocephalic and atraumatic. Eyes:  Sclera clear, no icterus.   Conjunctiva pink. Ears:  Normal auditory acuity. Nose:  No deformity, discharge,  or lesions. Mouth:  No deformity or lesions.   Neck:  Supple; no masses or thyromegaly. Lungs:  Clear throughout to auscultation.   No wheezes, crackles, or rhonchi. Heart:  Regular rate and rhythm; no murmurs, clicks, rubs,  or gallops. Abdomen:  Soft, tenderness across the epigastrium no guarding or rebound, BS active,nonpalp mass or hsm.   Rectal:  Deferred  Msk:  Symmetrical without gross  deformities. . Pulses:  Normal pulses noted. Extremities:  Without clubbing  or edema. Neurologic:  Alert and  oriented x4;  grossly normal neurologically. Skin:  Intact without significant lesions or rashes.. Psych:  Alert and cooperative. Normal mood and affect.  Intake/Output from previous day: 03/24 0701 - 03/25 0700 In: 3705.1 [P.O.:680; I.V.:3025.1] Out: 2775 [Urine:2775] Intake/Output this shift: Total I/O In: 60 [P.O.:60] Out: 1601 [Urine:1200; Emesis/NG output:401]  Lab Results: Recent Labs    05/09/20 1728 05/10/20 0900 05/11/20 0525  WBC 9.8 13.4* 8.2  HGB 12.2 10.3* 10.4*  HCT 37.0 31.7* 31.6*  PLT 402* 320 318   BMET Recent Labs    05/09/20 1728 05/10/20 0457 05/11/20 0525  NA 138 135 136  K 4.0 4.3 3.8  CL 98 101 105  CO2 _0 GLUCOSE 264* 260* 189*  BUN 27* 27* 14  CREATININE 0.95 1.10* 0.59  CALCIUM 9.8 8.2* 8.6*   LFT Recent Labs    05/11/20 0525  PROT 6.1*  ALBUMIN 3.0*  AST 28  ALT 28  ALKPHOS 54  BILITOT 0.9   PT/INR No results for input(s): LABPROT, INR in the last 72 hours. Hepatitis Panel No results for input(s): HEPBSAG, HCVAB, HEPAIGM, HEPBIGM in the last 72 hours.   IMPRESSION:  #42 30 year old female, insulin-dependent diabetic with recurrent admissions for intractable nausea vomiting and abdominal pain who was readmitted with 24-hour history of nausea vomiting and abdominal pain.  Symptoms are felt secondary to diabetic gastroparesis in a patient with poorly controlled diabetes and most recent hemoglobin A1c of 10.8.  CT unremarkable on admit with exception of bladder distention question developing neurogenic bladder  EGD December 2021 with mild gastritis only H. pylori negative  #2 mild normocytic anemia no iron deficiency  PLAN: #1 clear liquid diet as tolerated Continue IV Reglan 10 mg IV every 6 hours Will add erythromycin 250 mg IV every 8 hours for gastroparesis Add Protonix 40 mg IV daily Change Zofran to  4 mg IV every 6 hours around-the-clock rather than as needed Have decreased dose of Dilaudid, really need to discontinue altogether as will exacerbate gastroparesis.  Discussed with patient  Expect symptoms will improve over the next couple of days. Would not pursue gastric emptying scan while she is hospitalized as much more likely to be significantly abnormal. We will leave her scheduled for outpatient gastric emptying scan on 05/16/2020. As diet is advanced she needs to be on a gastroparesis diet. Also feel she ought to be referred to endocrinology outpt  for optimal management of her poorly controlled diabetes   Amy Esterwood PA-C 05/11/2020, 11:18 AM

## 2020-05-11 NOTE — Progress Notes (Signed)
PROGRESS NOTE    Kelli Hudson  ZOX:[id number] DOB: 10/24/1990 DOA: 05/09/2020 PCP: Mliss Sax, MD  Brief Narrative: The patient is a 30 year old African-American female with a past medical history significant for but not limited to diabetes mellitus type 1 with gastroparesis, hypertension, history of H. pylori infection, history of hyperthyroidism, history of GERD, history of gastroparesis as well as other comorbidities including amputation of the left fifth toe who presented for the evaluation of nausea vomiting as well as abdominal pain.  She presented to the emergency room with complaint of diffuse abdominal pain and persistent nausea vomiting that began in the morning and she does not remember any inciting event.  She states that she has had too numerous to count episodes of nausea vomiting has not had any coffee ground emesis with the vomiting but continued to have diffuse severe abdominal pain that did not radiate into her chest or pelvis.  She denied any diarrhea but she stated that she did notice some blood tinged discoloration in her emesis after several episodes of vomiting.  She is not having any fevers and does have a history of this in the past for which she has been hospitalized.  She has been given the working diagnosis of diabetic gastroparesis however has not been formally tested for this and her gastric emptying study to be done next week.  She feels that this is similar to her previous episodes and she is recently evaluated by [facility] was prescribed a medication last week which she does not remember but states that it did not help very much.  In the ED she is found to have a normal WBC and a normal x-ray of her abdomen.  She was admitted to the hospitalist service and also noted to have acute urinary retention as she was holding onto her urine and had at least 1.1 L noted on her CT scan abdomen pelvis.  She was steadily improving and her diet was advanced however  this morning she worsened and became extremely nauseous and started vomiting and had significant abdominal pain similar to her admission presentation.  She is given antiemetics and her pain medication was renewed and GI was consulted for further evaluation recommendations and she was also started on scheduled metoclopramide IV 10 mg every 8h.   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  -Pt is continued on basal insulin and currently on 13 units subcu nightly. Initiated on Moderate NovoLog sliding scale insulin AC for glycemic control. -Checked hemoglobin A1c and was 11.5 -She stake Tresiba 13 units qHS but not any Rapid-Acting Insulin; TOC has been Consulted by the Diabetes Patent attorney for Copay Cost of Rapid Acting Insulin -Patient with known history of gastroparesis was given dose of Haldol and Reglan in the emergency room but continues to have symptoms. -To monitor CBGs per protocol and CBGs have been ranging from 119-295; initial blood sugars less than 200 -Patient has recently seen gastroenterology as an outpatient and states she was given a medication but she does not member the name and it was not very helpful she states. -We will initiate metoclopramide IV 10 mg every 8 scheduled -Because she worsened today Gastroenterology will be consulted for formal evaluation  Intractable Nausea and Vomiting  -C/w Antiemetics with po/IV Ondansetron q6hprn Nausea. -IV fluid hydration with normal saline overnight and we reduced this to 100 mL/h -She was given metoclopramide for nausea and vomiting refractory to ondansetron and this has been renewed at 10 mg IV every 8 hours  scheduled -Initially she was on a clear liquid diet and was advanced to full liquid diet yesterday and is looking to go to soft today but he was able to even tolerate it she became extremely nauseous and started vomiting and found to have intractable nausea vomiting. -We will resume antiemetics with 4 mg p.o./IV ondansetron every 6 hours as needed -We will formally consult Gastroenterology for further evaluation recommendations given that the patient has worsened today  Essential Hypertension -Continue home medications Atenolol 25 mg po BID and Lisinopril 5 mg po Daily -Continue to Monitor BP per protocol -Last BP was 128/90 this AM   AKI -In the setting of Nausea and Vomiting  -Patient's BUN/Cr went from 27/0.95 -> 27/1.10 -> 14/0.59 -IVF Hydration as below -Avoid Nephrotoxic Medications, Contrast Dyes, Hypotension and Renally adjust Medications -Repeat CMP in the AM   Normocytic Anemia -Patient's Hgb/Hct went from 12.2/37.0 -> 10.3/31.7 -> 10.4/31.6 -Likely Dilutional Drop -Checked Anemia Panel and it showed an iron level of 99, U IBC of 359, TIBC of 458, saturation ratio of 22%, ferritin level 27, folate level 21.0, vitamin B12 547 -Continue to Monitor for S/Sx of Bleeding; No overt bleeding noted but she did have some blood in vomiting previously -GI has been consulted as above for her intractable nausea vomiting -Repeat CBC in the AM   Abdominal Pain, worsened -In the setting of Suspected Gastroparesis  -Patient was given Hydromorphone 1 mg x1 overnight and a dose of IV Morphine 4 mg; She had 1mg  of IV Hyrdromorphone q3hprn Severe Pain -Pt has hx of pylori infection and she states she has had ulcers in the past.  - Will obtain CT of the abdomen to make sure there is not any acute pathology; CT Abd/Pelvis showed "No acute abdominal/pelvic findings, mass lesions or adenopathy. Marked distention of the bladder almost up to the umbilicus. Bladder volume is estimated at 1.1 L." -  X-ray series done in the emergency room was negative and showed "Negative abdominal radiographs.  No acute cardiopulmonary disease." -Because she had Extreme abdominal Pain will consult Gastroenterology for further evaluation and recommendations  Tachycardia, improved  -Patient with Sinus Tachycardia secondary to pain and probable volume loss with persistent nausea and vomiting today. -IV fluid hydration provided and she is getting 150 mL of normal saline per hour but will change this to 100 and mils per hour for now; Received a bolus of NS 1,000 mL in the ED -Continue to Monitor on Telemetry; Last Documented HR was 96  Acute Urinary Retention improved -CT scan on admission showed that the bladder volume was estimated to be 1.1 L -Bladder scan done yesterday morning showed the patient was retaining 450 mL  -INO cath done and she may need a Foley catheter -Check a urinalysis and urine culture; urinalysis showed a clear appearance of her urine with greater than 500 glucose, negative hemoglobin, 20 ketones, rare bacteria, 0-5 squamous epithelial cells, 0-5 RBCs per high-power field and 0-5 WBCs with urine culture pending -We will check bladder scans every 6h -Continue monitor PVRs and her last PVR this morning was 4 mL  Leukocytosis -In the setting of nausea vomiting -Continue monitor carefully her she was also holding onto her  urine and retaining -CT of the abdomen and pelvis showed "No acute abdominal/pelvic findings, mass lesions or adenopathy. Marked distention of the bladder almost up to the umbilicus. Bladder volume is estimated at 1.1 L." -Continue to monitor for signs and symptoms of infection and currently holding off on antibiotics for now -WBC went from 9.8 -> 13.4 -> is now improved to 8.2 -Repeat CBC in a.m.  DVT prophylaxis: SCDs Code Status: FULL CODE  Family Communication: No family present at bedside  Disposition Plan: Pending further clinical improvement and tolerance of  Diet   Status is: Observation  The patient will require care spanning > 2 midnights and should be moved to inpatient because: Unsafe d/c plan, IV treatments appropriate due to intensity of illness or inability to take PO and Inpatient level of care appropriate due to severity of illness  Dispo: The patient is from: Home              Anticipated d/c is to: Home              Patient currently is not medically stable to d/c.   Difficult to place patient No  Consultants:   None   Procedures: None  Antimicrobials:  Anti-infectives (From admission, onward)   None        Subjective: Seen and examined at bedside and she felt worse and was having intractable nausea vomiting and abdominal pain again this morning.  This morning she was feeling fine but then acutely worsened and started retching and started having some active vomiting.  She did not feel well and was asking for pain medication.  No chest pain or shortness of breath.  No other concerns or complaints at this time and will consult Gastroenterology for further evaluation recommendations.  Objective: Vitals:   05/10/20 0545 05/10/20 0844 05/10/20 1444 05/10/20 2109  BP: (!) 97/59 110/82 124/84 128/90  Pulse: 91 92 96 96  Resp: 18 14 16 20   Temp: 98.1 F (36.7 C) 98.1 F (36.7 C) 98.4 F (36.9 C) 98.5 F (36.9 C)  TempSrc: Oral Oral Oral Oral  SpO2: 100% 100% 98% 100%  Weight:      Height:        Intake/Output Summary (Last 24 hours) at 05/11/2020 1128 Last data filed at 05/11/2020 1057 Gross per 24 hour  Intake 3765.07 ml  Output 4376 ml  Net -610.93 ml   Filed Weights   05/09/20 1722  Weight: 72.6 kg   Examination: Physical Exam:  Constitutional: WN/WD, ill-appearing African-American female who is actively vomiting and nauseous appears uncomfortable Eyes: Lids and conjunctivae normal, sclerae anicteric  ENMT: External Ears, Nose appear normal. Grossly normal hearing Neck: Appears normal, supple, no cervical  masses, normal ROM, no appreciable thyromegaly; no JVD Respiratory: Clear to auscultation bilaterally, no wheezing, rales, rhonchi or crackles. Normal respiratory effort and patient is not tachypenic. No accessory muscle use.  Cardiovascular: RRR, no murmurs / rubs / gallops. S1 and S2 auscultated. No extremity edema. 2+ pedal pulses. No carotid bruits. Unlabored breathing  Abdomen: Soft, Tender, non-distended. Bowel sounds positive.  GU: Deferred. Musculoskeletal: No clubbing / cyanosis of digits/nails. No joint deformity upper and lower extremities.   Skin: No rashes, lesions, ulcers on a limited skin evaluation. No induration; Warm and dry.  Neurologic: CN 2-12 grossly intact with no focal deficits. Romberg sign and cerebellar reflexes not assessed.  Psychiatric: Normal judgment and insight. Alert and oriented x 3. Normal mood and appropriate affect.   Data Reviewed: I  have personally reviewed following labs and imaging studies  CBC: Recent Labs  Lab 05/09/20 1728 05/10/20 0900 05/11/20 0525  WBC 9.8 13.4* 8.2  NEUTROABS  --  8.6* 3.4  HGB 12.2 10.3* 10.4*  HCT 37.0 31.7* 31.6*  MCV 89.4 92.2 92.4  PLT 402* 320 318   Basic Metabolic Panel: Recent Labs  Lab 05/09/20 1728 05/10/20 0457 05/11/20 0525  NA 138 135 136  K 4.0 4.3 3.8  CL 98 101 105  CO2 27 23 23   GLUCOSE 264* 260* 189*  BUN 27* 27* 14  CREATININE 0.95 1.10* 0.59  CALCIUM 9.8 8.2* 8.6*  MG  --   --  1.8  PHOS  --   --  2.8   GFR: Estimated Creatinine Clearance: 105.6 mL/min (by C-G formula based on SCr of 0.59 mg/dL). Liver Function Tests: Recent Labs  Lab 05/09/20 1728 05/11/20 0525  AST 31 28  ALT 36 28  ALKPHOS 81 54  BILITOT 0.8 0.9  PROT 7.8 6.1*  ALBUMIN 3.9 3.0*   Recent Labs  Lab 05/09/20 1728  LIPASE 25   No results for input(s): AMMONIA in the last 168 hours. Coagulation Profile: No results for input(s): INR, PROTIME in the last 168 hours. Cardiac Enzymes: No results for  input(s): CKTOTAL, CKMB, CKMBINDEX, TROPONINI in the last 168 hours. BNP (last 3 results) No results for input(s): PROBNP in the last 8760 hours. HbA1C: Recent Labs    05/09/20 1728  HGBA1C 11.5*   CBG: Recent Labs  Lab 05/10/20 2034 05/11/20 0001 05/11/20 0417 05/11/20 0727 05/11/20 1121  GLUCAP 273* 179* 183* 180* 125*   Lipid Profile: No results for input(s): CHOL, HDL, LDLCALC, TRIG, CHOLHDL, LDLDIRECT in the last 72 hours. Thyroid Function Tests: No results for input(s): TSH, T4TOTAL, FREET4, T3FREE, THYROIDAB in the last 72 hours. Anemia Panel: Recent Labs    05/11/20 0525  VITAMINB12 547  FOLATE 21.0  FERRITIN 27  TIBC 458*  IRON 99  RETICCTPCT 2.4   Sepsis Labs: No results for input(s): PROCALCITON, LATICACIDVEN in the last 168 hours.  Recent Results (from the past 240 hour(s))  SARS CORONAVIRUS 2 (TAT 6-24 HRS) Nasopharyngeal Nasopharyngeal Swab     Status: None   Collection Time: 05/09/20  8:53 PM   Specimen: Nasopharyngeal Swab  Result Value Ref Range Status   SARS Coronavirus 2 NEGATIVE NEGATIVE Final    Comment: (NOTE) SARS-CoV-2 target nucleic acids are NOT DETECTED.  The SARS-CoV-2 RNA is generally detectable in upper and lower respiratory specimens during the acute phase of infection. Negative results do not preclude SARS-CoV-2 infection, do not rule out co-infections with other pathogens, and should not be used as the sole basis for treatment or other patient management decisions. Negative results must be combined with clinical observations, patient history, and epidemiological information. The expected result is Negative.  Fact Sheet for Patients: HairSlick.nohttps://www.fda.gov/media/138098/download  Fact Sheet for Healthcare Providers: quierodirigir.comhttps://www.fda.gov/media/138095/download  This test is not yet approved or cleared by the [place] FDA and  has been authorized for detection and/or diagnosis of SARS-CoV-2 by FDA under an Emergency Use  Authorization (EUA). This EUA will remain  in effect (meaning this test can be used) for the duration of the COVID-19 declaration under Se ction 564(b)(1) of the Act, 21 U.S.C. section 360bbb-3(b)(1), unless the authorization is terminated or revoked sooner.  Performed at [facility], [address] [place], [place] [address]     RN Pressure Injury Documentation:     Estimated  body mass index is 24.33 kg/m as calculated from the following:   Height as of this encounter: 5\' 8"  (1.727 m).   Weight as of this encounter: 72.6 kg.  Malnutrition Type:   Malnutrition Characteristics:   Nutrition Interventions:   Radiology Studies: CT ABDOMEN PELVIS W CONTRAST  Result Date: 05/09/2020 CLINICAL DATA:  Abdominal pain since this morning. EXAM: CT ABDOMEN AND PELVIS WITH CONTRAST TECHNIQUE: Multidetector CT imaging of the abdomen and pelvis was performed using the standard protocol following bolus administration of intravenous contrast. CONTRAST:  05/11/2020 OMNIPAQUE IOHEXOL 300 MG/ML  SOLN COMPARISON:  01/24/2020 FINDINGS: Lower chest: Insert lung bases Hepatobiliary: No hepatic lesions or intrahepatic biliary dilatation. The gallbladder appears normal. No common bile duct dilatation. Pancreas: No mass, inflammation or ductal dilatation. Spleen: Normal size.  No focal lesions. Adrenals/Urinary Tract: Adrenal glands and kidneys are unremarkable. No renal mass, renal calculi or findings for pyelonephritis. There is fairly marked distention of the bladder almost up to the umbilicus. Bladder volume is estimated at 1.1 L. no bladder mass, bladder calculi or asymmetric bladder wall thickening. Stomach/Bowel: The stomach, duodenum, small bowel and colon are grossly normal. No acute inflammatory changes, mass lesions or obstructive findings. The terminal ileum is normal. The appendix is normal. Vascular/Lymphatic: The aorta is normal in caliber. No dissection. The branch vessels are patent. The major  venous structures are patent. No mesenteric or retroperitoneal mass or adenopathy. Small scattered lymph nodes are noted. Reproductive: The uterus and ovaries are unremarkable. Other: No pelvic mass or adenopathy. No free pelvic fluid collections. No inguinal mass or adenopathy. No abdominal wall hernia or subcutaneous lesions. Musculoskeletal: No significant bony findings. IMPRESSION: 1. No acute abdominal/pelvic findings, mass lesions or adenopathy. 2. Marked distention of the bladder almost up to the umbilicus. Bladder volume is estimated at 1.1 L. Electronically Signed   By: 14/08/2019 M.D.   On: 05/09/2020 21:57   DG Abdomen Acute W/Chest  Result Date: 05/09/2020 CLINICAL DATA:  Nausea vomiting abdominal pain EXAM: DG ABDOMEN ACUTE WITH 1 VIEW CHEST COMPARISON:  02/25/2020 FINDINGS: There is no evidence of dilated bowel loops or free intraperitoneal air. No radiopaque calculi or other significant radiographic abnormality is seen. Heart size and mediastinal contours are within normal limits. Both lungs are clear. IMPRESSION: Negative abdominal radiographs.  No acute cardiopulmonary disease. Electronically Signed   By: 04/24/2020 M.D.   On: 05/09/2020 20:28   Scheduled Meds: . atenolol  25 mg Oral BID  . insulin aspart  0-15 Units Subcutaneous TID WC  . insulin glargine  13 Units Subcutaneous QHS  . lisinopril  5 mg Oral Daily  . metoCLOPramide (REGLAN) injection  10 mg Intravenous Q8H   Continuous Infusions: . sodium chloride 100 mL/hr at 05/11/20 0703    LOS: 0 days   05/13/20, DO [facility] PAGER is on AMION  If 7PM-7AM, please contact night-coverage [email or web address]

## 2020-05-11 NOTE — Discharge Summary (Addendum)
Physician Discharge Summary  Kelli Hudson LSL:[id number] DOB: 07/30/1990 DOA: 05/09/2020  PCP: Libby Maw, MD  Admit date: 05/09/2020 Discharge date: 05/14/2020  Admitted From: Home Disposition: Home  Recommendations for Outpatient Follow-up:  1. Follow up with PCP in 1-2 weeks 2. Follow up with Endocrinology as an outpatient in 1-2 weeks 3. Follow up with Gastroenterology in 1-2 weeks; Continue GES on 05/16/20 4. Please obtain CMP/CBC, Mag, Phos in one week 5. Please follow up on the following pending results:  Home Health: No Equipment/Devices: None    Discharge Condition: Stable  CODE STATUS: FULL CODE Diet recommendation: Heart Healthy Carb Modified Diet  Brief/Interim Summary: The patient is a 30 year old African-American female with a past medical history significant for but not limited to diabetes mellitus type 1 with gastroparesis, hypertension, history of H. pylori infection, history of hyperthyroidism, history of GERD, history of gastroparesis as well as other comorbidities including amputation of the left fifth toe who presented for the evaluation of nausea vomiting as well as abdominal pain.  She presented to the emergency room with complaint of diffuse abdominal pain and persistent nausea vomiting that began in the morning and she does not remember any inciting event.  She states that she has had too numerous to count episodes of nausea vomiting has not had any coffee ground emesis with the vomiting but continued to have diffuse severe abdominal pain that did not radiate into her chest or pelvis.  She denied any diarrhea but she stated that she did notice some blood tinged discoloration in her emesis after several episodes of vomiting.  She is not having any fevers and does have a history of this in the past for which she has been hospitalized.  She has been given the working diagnosis of diabetic gastroparesis however has not been formally tested for this and  her gastric emptying study to be done next week.  She feels that this is similar to her previous episodes and she is recently evaluated by [facility] was prescribed a medication last week which she does not remember but states that it did not help very much.  In the ED she is found to have a normal WBC and a normal x-ray of her abdomen.  She was admitted to the hospitalist service and also noted to have acute urinary retention as she was holding onto her urine and had at least 1.1 L noted on her CT scan abdomen pelvis.  She was steadily improving and her diet was advanced however this morning she worsened and became extremely nauseous and started vomiting and had significant abdominal pain similar to her admission presentation.  She is given antiemetics and her pain medication was renewed and GI was consulted for further evaluation recommendations and she was also started on scheduled metoclopramide IV 10 mg every 8h.  Gastroenterology started her on IV erythromycin and Levbid 0.375 mg p.o. every 12 as needed cramping abdominal pain. She continues to have intractable nausea vomiting and now continues abdominal pain so her pain medications have been renewed.  She failed to improve yesterday so she is kept on a full liquid diet but now is improving today.  Will defer further diet advancement to GI given that she has no Nausea/Vomiting or Abdominal Pain today and they have advanced her diet to Soft Diet. GI Cleared her for D/C home as she is significantly improved. 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  -Pt is continued onbasal insulin and currently on 13 units subcu nightly. Initiated on Moderate NovoLog sliding scale insulin AC for glycemic control. -Checked hemoglobin A1c and was 11.5 -She stake Tresiba 13 units qHS but not any Rapid-Acting Insulin; TOC has been Consulted by the Diabetes IT sales professional for Copay Cost of Rapid Acting Insulin -Patient with known history of gastroparesis was given dose of Haldol and Reglan in the emergency room but continues to have symptoms. -To monitor CBGs per protocol and CBGs have been ranging from 109-165; initial blood sugars less than 200 -Patient has recently seen gastroenterology as an outpatient and states she was given a medication but she does not member the name and it was not very helpful she states. -We will initiate metoclopramide IV 10 mg every 8 scheduled continue for now; gastroenterology has started the patient on erythromycin to 50 mg every 8 hours as scheduled and have also started the patient on 4 mg p.o./IV every 6 standing Zofran and because she continues to have intractable nausea vomiting we will add IV Compazine 10 mg every 6 as needed for refractory nausea vomiting -Because she worsened today Gastroenterology will be consulted for formal evaluation and appreciate further evaluation -Continues to have significant abdominal pain so we will continue with 0.5 mg of hydromorphone every 3 hours as needed for severe pain and limit the amount of narcotics that the patient gets if able  -GI recommend weaning off narcotics if possible and adding back IV Protonix and we have doubled the dose of IV Protonix given that she continues have some burning sensation in her chest -GI discussed about her right upper quadrant ultrasound and they do not feel the patient needed 1 but they ordered it to screen for gallstones and this was negative for any pathology  -Blood sugars are better controlled and she is stable to be discharged and will  be discharged on her home regimen of the Tresiba 13 units and will write for a Moderate Novolog 0-15 Scale  Intractable Nausea and Vomiting in the setting of Diabetic Gastroparesis, Improving  -C/w Antiemetics with po/IV Ondansetron q6hprn Nausea but GI scheduled this and I added IV Compazine today given her intractable nausea vomiting still. -IV fluid hydration with normal saline overnight and we reduced this to 100 mL/h and will continue with IV fluid hydration at this time -She was given metoclopramide for nausea and vomiting refractory to ondansetron and this has been renewed at 10 mg IV every 8 hours scheduled -Diet was cut back to clear liquid diet but advanced to FULL Liquid a few nights ago.  She remains on full yesterday and feels better today so gastroenterology has advance diet to a soft diet today -She is on IV PPI BID and will add IV Famotidine 20 mg BID -GI consulted for further evaluation and recommendations  -Abdominal pain is improved as well as her Nausea an Vomiting -I discussed the case with gastroenterology Dr. Bryan Lemma who recommended twice daily PPI, famotidine twice daily, as needed Phenergan, and metoclopramide as an outpatient after her GES study is done  Hypomagnesemia -Patient's Mag level was 1.6 and today is 1.8 -Replete with IV Mag Sulfate  2 grams again prior to D/C -Continue to Monitor and Replete as Necessary -Repeat Mag Level in the AM   Essential Hypertension -Continue home medications Atenolol 25 mg po BID and Lisinopril 5 mg po Daily -Continue to Monitor BP per protocol -Last BP was 125/86 this AM   AKI -In the setting of Nausea and Vomiting -Urinalysis was relatively unremarkable and showed a clear appearance with negative leukocytes, negative nitrates, rare bacteria, 0-5 RBCs per high-power field, 0-5 squamous epithelial cells, and 0-5 WBCs but urine culture is growing out greater than 100,000 colony-forming units of lactobacillus Speices -Will  not Treat  -Patient's BUN/Cr went from 27/0.95 -> 27/1.10 -> 14/0.59 -> 12/0.70 -> 10/0.57 -> 11/0.88 -IVF Hydration as below -Avoid Nephrotoxic Medications, Contrast Dyes, Hypotension and Renally adjust Medications -Repeat CMP within 1 week   Hyperbilirubinemia -Patient's T Bili went from 1.1 -> 1.6 and likely reactive from Nausea and Vomiting and has now improved to 1.2 -Continue to Monitor and Trend -Repeat CMP within 1 week   Normocytic Anemia -Patient's Hgb/Hct went from 12.2/37.0 -> 10.3/31.7 -> 10.4/31.6 -> 12.2/36.6 -> 12.8/38.6 -> 10.5/31.8 -Likely Dilutional Drop -Checked Anemia Panel and it showed an iron level of 99, U IBC of 359, TIBC of 458, saturation ratio of 22%, ferritin level 27, folate level 21.0, vitamin B12 547 -Continue to Monitor for S/Sx of Bleeding; No overt bleeding noted but she did have some blood in vomiting previously -GI has been consulted as above for her intractable nausea vomiting and appreciate their assistance  -Repeat CBC within 1 week   Hyponatremia -Mild as Sodium went from 136 -> 132 -> 131 -> 136 today  -IVF as above -Continue to Monitor and Trend -Repeat CMP within 1 week   Abdominal Pain, initially worsened and now improving significantly -In the setting of Suspected Gastroparesis  -Patient was given Hydromorphone 1 mg x1 overnight and a dose of IV Morphine 4 mg; She had 1m of IV Hyrdromorphone q3hprn Severe Pain -Pt has hx ofpylori infection and she states she has had ulcers in the past.  -Will obtain CT of the abdomen to make sure there is not any acute pathology; CT Abd/Pelvis showed "No acute abdominal/pelvic findings, mass lesions or adenopathy. Marked distention of the bladder almost up to the umbilicus. Bladder volume is estimated at 1.1 L." -X-ray series done in the emergency room was negative and showed "Negative abdominal radiographs. No acute cardiopulmonary disease." -Because she had Extreme abdominal Pain will consult  Gastroenterology for further evaluation and recommendations -Continue to Monitor for Retention  -Patient was Pain free today   Tachycardia, improved -Patient with Sinus Tachycardia secondary to pain and probable volume loss with persistent nausea and vomiting.  She continues to have nausea vomiting today -IV fluid hydration provided and she is getting 150 mL of normal saline per hour but will change this to 100 and mils per hour for now; Received a bolus of NS 1,000 mL in the ED -Continue to Monitor on Telemetry; Last Documented HR was 90  Acute Urinary Retention improved -CT scan on admission showed that the bladder volume was estimated to be 1.1 L -Bladder scan done yesterday morning showed the patient was retaining 450 mL  -INO cath done and she may need a Foley catheter -Check a urinalysis and urine culture; urinalysis showed a clear appearance of her urine with greater than 500 glucose, negative hemoglobin, 20 ketones, rare bacteria, 0-5 squamous epithelial cells, 0-5 RBCs per high-power field and 0-5 WBCs with urine  culture pending -We will check bladder scans every 6h -Continue monitor PVRs carefully and no longer retaining -If continues to have issues will need a Urology follow up   Leukocytosis, improved but slightly trending back up again -In the setting of nausea vomiting -Continue monitor carefully her she was also holding onto her urine and retaining -CT of the abdomen and pelvis showed "No acute abdominal/pelvic findings, mass lesions or adenopathy. Marked distention of the bladder almost up to the umbilicus. Bladder volume is estimated at 1.1 L." -Continue to monitor for signs and symptoms of infection and currently holding off on antibiotics for now -WBC went from 9.8 -> 13.4 -> 8.2 -> 10.1 -> 9.4 -> 9.2 -Repeat CBC within 1 week  Discharge Instructions  Discharge Instructions    Call MD for:  difficulty breathing, headache or visual disturbances   Complete by: As  directed    Call MD for:  extreme fatigue   Complete by: As directed    Call MD for:  hives   Complete by: As directed    Call MD for:  persistant dizziness or light-headedness   Complete by: As directed    Call MD for:  persistant nausea and vomiting   Complete by: As directed    Call MD for:  redness, tenderness, or signs of infection (pain, swelling, redness, odor or green/yellow discharge around incision site)   Complete by: As directed    Call MD for:  severe uncontrolled pain   Complete by: As directed    Call MD for:  temperature >100.4   Complete by: As directed    Diet - low sodium heart healthy   Complete by: As directed    Diet Carb Modified   Complete by: As directed    Discharge instructions   Complete by: As directed    You were cared for by a hospitalist during your hospital stay. If you have any questions about your discharge medications or the care you received while you were in the hospital after you are discharged, you can call the unit and ask to speak with the hospitalist on call if the hospitalist that took care of you is not available. Once you are discharged, your primary care physician will handle any further medical issues. Please note that NO REFILLS for any discharge medications will be authorized once you are discharged, as it is imperative that you return to your primary care physician (or establish a relationship with a primary care physician if you do not have one) for your aftercare needs so that they can reassess your need for medications and monitor your lab values.  Follow up with PCP and Gastroenterology as an outpatient and continue with GES this week. Take all medications as prescribed. If symptoms change or worsen please return to the ED for evaluation   Increase activity slowly   Complete by: As directed      Allergies as of 05/14/2020   No Known Allergies     Medication List    STOP taking these medications   dicyclomine 20 MG  tablet Commonly known as: BENTYL     TAKE these medications   atenolol 25 MG tablet Commonly known as: TENORMIN Take 1 tablet (25 mg total) by mouth 2 (two) times daily.   famotidine 20 MG tablet Commonly known as: PEPCID Take 1 tablet (20 mg total) by mouth 2 (two) times daily.   feeding supplement (ENSURE COMPLETE) Liqd Take 237 mLs by mouth 2 (two) times daily between  meals.   hyoscyamine 0.375 MG 12 hr tablet Commonly known as: LEVBID Take 1 tablet (0.375 mg total) by mouth every 12 (twelve) hours as needed for cramping (abdominal pain).   Insulin Pen Needle 31G X 5 MM Misc 1 Device by Does not apply route QID. For use with insulin pens   insulin regular 100 units/mL injection Commonly known as: NovoLIN R Inject 0.05 mLs (5 Units total) into the skin 3 (three) times daily before meals. What changed:   how much to take  additional instructions   lisinopril 5 MG tablet Commonly known as: ZESTRIL Take 5 mg by mouth daily.   metoCLOPramide 5 MG tablet Commonly known as: Reglan Take 1 tablet (5 mg total) by mouth 3 (three) times daily before meals. Start taking on: May 17, 2020 What changed: These instructions start on May 17, 2020. If you are unsure what to do until then, ask your doctor or other care provider.   multivitamin with minerals Tabs tablet Take 1 tablet by mouth daily.   ondansetron 4 MG disintegrating tablet Commonly known as: Zofran ODT Take 1 tablet (4 mg total) by mouth every 6 (six) hours as needed for nausea or vomiting.   pantoprazole 40 MG tablet Commonly known as: PROTONIX Take 1 tablet (40 mg total) by mouth 2 (two) times daily. What changed: when to take this   promethazine 12.5 MG tablet Commonly known as: PHENERGAN Take 1 tablet (12.5 mg total) by mouth every 6 (six) hours as needed for nausea or vomiting.   Tyler Aas FlexTouch 100 UNIT/ML FlexTouch Pen Generic drug: insulin degludec Inject 13 Units into the skin at bedtime.    True Metrix Blood Glucose Test test strip Generic drug: glucose blood Use as instructed   True Metrix Meter w/Device Kit 1 Device by Does not apply route 4 (four) times daily.   TRUEplus Lancets 28G Misc assist with checking blood sugar TID and qhs       Follow-up Information    Libby Maw, MD. Call.   Specialty: Family Medicine Why: Follow up within 1-2 weeks Contact information: [place] [id number] [phone]        [facility]. Call.   Specialty: Gastroenterology Why: Follow up within 1-2 weeks Contact information: [address] [place] [address] [phone]             No Known Allergies  Consultations:  Gastroenteoogy   Procedures/Studies: CT ABDOMEN PELVIS W CONTRAST  Result Date: 05/09/2020 CLINICAL DATA:  Abdominal pain since this morning. EXAM: CT ABDOMEN AND PELVIS WITH CONTRAST TECHNIQUE: Multidetector CT imaging of the abdomen and pelvis was performed using the standard protocol following bolus administration of intravenous contrast. CONTRAST:  158m OMNIPAQUE IOHEXOL 300 MG/ML  SOLN COMPARISON:  01/24/2020 FINDINGS: Lower chest: Insert lung bases Hepatobiliary: No hepatic lesions or intrahepatic biliary dilatation. The gallbladder appears normal. No common bile duct dilatation. Pancreas: No mass, inflammation or ductal dilatation. Spleen: Normal size.  No focal lesions. Adrenals/Urinary Tract: Adrenal glands and kidneys are unremarkable. No renal mass, renal calculi or findings for pyelonephritis. There is fairly marked distention of the bladder almost up to the umbilicus. Bladder volume is estimated at 1.1 L. no bladder mass, bladder calculi or asymmetric bladder wall thickening. Stomach/Bowel: The stomach, duodenum, small bowel and colon are grossly normal. No acute inflammatory changes, mass lesions or obstructive findings. The terminal ileum is normal. The appendix is normal.  Vascular/Lymphatic: The aorta is normal in caliber. No  dissection. The branch vessels are patent. The major venous structures are patent. No mesenteric or retroperitoneal mass or adenopathy. Small scattered lymph nodes are noted. Reproductive: The uterus and ovaries are unremarkable. Other: No pelvic mass or adenopathy. No free pelvic fluid collections. No inguinal mass or adenopathy. No abdominal wall hernia or subcutaneous lesions. Musculoskeletal: No significant bony findings. IMPRESSION: 1. No acute abdominal/pelvic findings, mass lesions or adenopathy. 2. Marked distention of the bladder almost up to the umbilicus. Bladder volume is estimated at 1.1 L. Electronically Signed   By: Marijo Sanes M.D.   On: 05/09/2020 21:57   DG Abdomen Acute W/Chest  Result Date: 05/09/2020 CLINICAL DATA:  Nausea vomiting abdominal pain EXAM: DG ABDOMEN ACUTE WITH 1 VIEW CHEST COMPARISON:  02/25/2020 FINDINGS: There is no evidence of dilated bowel loops or free intraperitoneal air. No radiopaque calculi or other significant radiographic abnormality is seen. Heart size and mediastinal contours are within normal limits. Both lungs are clear. IMPRESSION: Negative abdominal radiographs.  No acute cardiopulmonary disease. Electronically Signed   By: Franchot Gallo M.D.   On: 05/09/2020 20:28   US Abdomen Limited RUQ (LIVER/GB)  Result Date: 05/11/2020 CLINICAL DATA:  Abdominal pain EXAM: ULTRASOUND ABDOMEN LIMITED RIGHT UPPER QUADRANT COMPARISON:  None. FINDINGS: Gallbladder: No gallstones or wall thickening visualized. No sonographic Murphy sign noted by sonographer. Common bile duct: Diameter: 2 mm Liver: No focal lesion identified. Within normal limits in parenchymal echogenicity. Portal vein is patent on color Doppler imaging with normal direction of blood flow towards the liver. Other: None. IMPRESSION: No acute abnormality noted. Electronically Signed   By: Inez Catalina M.D.   On: 05/11/2020 20:43    Subjective:  Seen  and examined at bedside and she states that she is feeling much better today.  She denies any chest pain first with, nausea, vomiting and states her abdominal pain is improved and resolved.  Has not vomited today at all.  Denies any other concerns or complaints at this time and has been tolerating her food better and she was cleared by GI to be discharged so she will follow up with them for her GES on Wednesday.  Discharge Exam: Vitals:   05/14/20 0604 05/14/20 1208  BP: (!) 134/91 125/86  Pulse: 90 90  Resp: 16 20  Temp: 98.6 F (37 C) 98.7 F (37.1 C)  SpO2: 98% 99%   Vitals:   05/13/20 1233 05/13/20 2006 05/14/20 0604 05/14/20 1208  BP: 133/90 112/75 (!) 134/91 125/86  Pulse: (!) 101 95 90 90  Resp: _0 Temp: 99.2 F (37.3 C) 98.6 F (37 C) 98.6 F (37 C) 98.7 F (37.1 C)  TempSrc: Oral Oral Oral Oral  SpO2: 99% 100% 98% 99%  Weight:      Height:       Examination: Physical Exam:  Constitutional: WN/WD AAF in NAD appears calm and Abdominal Pain has resolved Eyes: Lids and conjunctivae normal, sclerae anicteric  ENMT: External Ears, Nose appear normal. Grossly normal hearing.  Neck: Appears normal, supple, no cervical masses, normal ROM, no appreciable thyromegaly; no JVD Respiratory: Mildly diminished to auscultation bilaterally, no wheezing, rales, rhonchi or crackles. Normal respiratory effort and patient is not tachypenic. No accessory muscle use.  Cardiovascular: RRR, no murmurs / rubs / gallops. S1 and S2 auscultated. No extremity edema Abdomen: Soft, non-tender, mildly distended. Bowel sounds positive.  GU: Deferred. Musculoskeletal: No clubbing / cyanosis of digits/nails. No joint deformity upper and lower extremities.   Skin:  No rashes, lesions, ulcers on limited skin evaluation. No induration; Warm and dry.  Neurologic: CN 2-12 grossly intact with no focal deficits. Romberg sign cerebellar reflexes not assessed.  Psychiatric: Normal judgment and  insight. Alert and oriented x 3. Normal mood and appropriate affect.   The results of significant diagnostics from this hospitalization (including imaging, microbiology, ancillary and laboratory) are listed below for reference.    Microbiology: Recent Results (from the past 240 hour(s))  SARS CORONAVIRUS 2 (TAT 6-24 HRS) Nasopharyngeal Nasopharyngeal Swab     Status: None   Collection Time: 05/09/20  8:53 PM   Specimen: Nasopharyngeal Swab  Result Value Ref Range Status   SARS Coronavirus 2 NEGATIVE NEGATIVE Final    Comment: (NOTE) SARS-CoV-2 target nucleic acids are NOT DETECTED.  The SARS-CoV-2 RNA is generally detectable in upper and lower respiratory specimens during the acute phase of infection. Negative results do not preclude SARS-CoV-2 infection, do not rule out co-infections with other pathogens, and should not be used as the sole basis for treatment or other patient management decisions. Negative results must be combined with clinical observations, patient history, and epidemiological information. The expected result is Negative.  Fact Sheet for Patients: SugarRoll.be  Fact Sheet for Healthcare Providers: https://www.woods-mathews.com/  This test is not yet approved or cleared by the [place] FDA and  has been authorized for detection and/or diagnosis of SARS-CoV-2 by FDA under an Emergency Use Authorization (EUA). This EUA will remain  in effect (meaning this test can be used) for the duration of the COVID-19 declaration under Se ction 564(b)(1) of the Act, 21 U.S.C. section 360bbb-3(b)(1), unless the authorization is terminated or revoked sooner.  Performed at [facility], [place] [address] [place], [place] [address]   Culture, Urine     Status: Abnormal   Collection Time: 05/10/20 11:53 AM   Specimen: Urine, Catheterized  Result Value Ref Range Status   Specimen Description URINE, CATHETERIZED  Final    Special Requests NONE  Final   Culture (A)  Final    >=100,000 COLONIES/mL LACTOBACILLUS SPECIES Standardized susceptibility testing for this organism is not available.    Report Status 05/11/2020 FINAL  Final    Labs: BNP (last 3 results) No results for input(s): BNP in the last 8760 hours. Basic Metabolic Panel: Recent Labs  Lab 05/10/20 0457 05/11/20 0525 05/12/20 1024 05/13/20 0518 05/14/20 0425  NA 135 136 132* 131* 136  K 4.3 3.8 4.3 5.0 4.2  CL 101 105 99 97* 106  CO2 _0 20* 25  GLUCOSE 260* 189* 190* 227* 111*  BUN 27* _1 CREATININE 1.10* 0.59 0.70 0.57 0.88  CALCIUM 8.2* 8.6* 9.3 9.4 8.9  MG  --  1.8 1.7 1.6* 1.8  PHOS  --  2.8 2.5 3.0 3.3   Liver Function Tests: Recent Labs  Lab 05/09/20 1728 05/11/20 0525 05/12/20 1024 05/13/20 0518 05/14/20 0425  AST _2 34 26  ALT 36 _3 ALKPHOS 81 54 63 63 51  BILITOT 0.8 0.9 1.1 1.6* 1.2  PROT 7.8 6.1* 7.0 7.4 5.6*  ALBUMIN 3.9 3.0* 3.3* 3.4* 2.7*   Recent Labs  Lab 05/09/20 1728  LIPASE 25   No results for input(s): AMMONIA in the last 168 hours. CBC: Recent Labs  Lab 05/10/20 0900 05/11/20 0525 05/12/20 1024 05/13/20 0518 05/14/20 0425  WBC 13.4* 8.2 10.1 9.4 9.2  NEUTROABS 8.6* 3.4 7.8* 7.1 3.5  HGB 10.3*  10.4* 12.2 12.8 10.5*  HCT 31.7* 31.6* 36.6 38.6 31.8*  MCV 92.2 92.4 87.6 89.6 90.6  PLT 320 318 362 384 318   Cardiac Enzymes: No results for input(s): CKTOTAL, CKMB, CKMBINDEX, TROPONINI in the last 168 hours. BNP: Invalid input(s): POCBNP CBG: Recent Labs  Lab 05/13/20 2008 05/13/20 2353 05/14/20 0606 05/14/20 0744 05/14/20 1206  GLUCAP 138* 109* 112* 142* 165*   D-Dimer No results for input(s): DDIMER in the last 72 hours. Hgb A1c No results for input(s): HGBA1C in the last 72 hours. Lipid Profile No results for input(s): CHOL, HDL, LDLCALC, TRIG, CHOLHDL, LDLDIRECT in the last 72 hours. Thyroid function studies No results for input(s): TSH,  T4TOTAL, T3FREE, THYROIDAB in the last 72 hours.  Invalid input(s): FREET3 Anemia work up No results for input(s): VITAMINB12, FOLATE, FERRITIN, TIBC, IRON, RETICCTPCT in the last 72 hours. Urinalysis    Component Value Date/Time   COLORURINE YELLOW 05/10/2020 1153   APPEARANCEUR CLEAR 05/10/2020 1153   LABSPEC 1.022 05/10/2020 1153   PHURINE 5.0 05/10/2020 1153   GLUCOSEU >=500 (A) 05/10/2020 1153   GLUCOSEU >=1000 (A) 07/05/2019 1343   HGBUR NEGATIVE 05/10/2020 1153   BILIRUBINUR NEGATIVE 05/10/2020 1153   KETONESUR 20 (A) 05/10/2020 1153   PROTEINUR 100 (A) 05/10/2020 1153   UROBILINOGEN 0.2 07/05/2019 1343   NITRITE NEGATIVE 05/10/2020 1153   LEUKOCYTESUR NEGATIVE 05/10/2020 1153   Sepsis Labs Invalid input(s): PROCALCITONIN,  WBC,  LACTICIDVEN Microbiology Recent Results (from the past 240 hour(s))  SARS CORONAVIRUS 2 (TAT 6-24 HRS) Nasopharyngeal Nasopharyngeal Swab     Status: None   Collection Time: 05/09/20  8:53 PM   Specimen: Nasopharyngeal Swab  Result Value Ref Range Status   SARS Coronavirus 2 NEGATIVE NEGATIVE Final    Comment: (NOTE) SARS-CoV-2 target nucleic acids are NOT DETECTED.  The SARS-CoV-2 RNA is generally detectable in upper and lower respiratory specimens during the acute phase of infection. Negative results do not preclude SARS-CoV-2 infection, do not rule out co-infections with other pathogens, and should not be used as the sole basis for treatment or other patient management decisions. Negative results must be combined with clinical observations, patient history, and epidemiological information. The expected result is Negative.  Fact Sheet for Patients: SugarRoll.be  Fact Sheet for Healthcare Providers: https://www.woods-mathews.com/  This test is not yet approved or cleared by the [place] FDA and  has been authorized for detection and/or diagnosis of SARS-CoV-2 by FDA under an Emergency Use  Authorization (EUA). This EUA will remain  in effect (meaning this test can be used) for the duration of the COVID-19 declaration under Se ction 564(b)(1) of the Act, 21 U.S.C. section 360bbb-3(b)(1), unless the authorization is terminated or revoked sooner.  Performed at [facility], [place] [address] [place], [place] [address]   Culture, Urine     Status: Abnormal   Collection Time: 05/10/20 11:53 AM   Specimen: Urine, Catheterized  Result Value Ref Range Status   Specimen Description URINE, CATHETERIZED  Final   Special Requests NONE  Final   Culture (A)  Final    >=100,000 COLONIES/mL LACTOBACILLUS SPECIES Standardized susceptibility testing for this organism is not available.    Report Status 05/11/2020 FINAL  Final   Time coordinating discharge: 35 minutes  SIGNED:  Kerney Elbe, DO [facility] 05/14/2020, 3:17 PM Pager is on [place]  If 7PM-7AM, please contact night-coverage [email or web address]

## 2020-05-11 NOTE — Plan of Care (Signed)

## 2020-05-12 LAB — CBC WITH DIFFERENTIAL/PLATELET
Abs Immature Granulocytes: 0.03 10*3/uL (ref 0.00–0.07)
Basophils Absolute: 0 10*3/uL (ref 0.0–0.1)
Basophils Relative: 0 %
Eosinophils Absolute: 0 10*3/uL (ref 0.0–0.5)
Eosinophils Relative: 0 %
HCT: 36.6 % (ref 36.0–46.0)
Hemoglobin: 12.2 g/dL (ref 12.0–15.0)
Immature Granulocytes: 0 %
Lymphocytes Relative: 17 %
Lymphs Abs: 1.7 10*3/uL (ref 0.7–4.0)
MCH: 29.2 pg (ref 26.0–34.0)
MCHC: 33.3 g/dL (ref 30.0–36.0)
MCV: 87.6 fL (ref 80.0–100.0)
Monocytes Absolute: 0.6 10*3/uL (ref 0.1–1.0)
Monocytes Relative: 5 %
Neutro Abs: 7.8 10*3/uL — ABNORMAL HIGH (ref 1.7–7.7)
Neutrophils Relative %: 78 %
Platelets: 362 10*3/uL (ref 150–400)
RBC: 4.18 MIL/uL (ref 3.87–5.11)
RDW: 12.4 % (ref 11.5–15.5)
WBC: 10.1 10*3/uL (ref 4.0–10.5)
nRBC: 0 % (ref 0.0–0.2)

## 2020-05-12 LAB — COMPREHENSIVE METABOLIC PANEL
ALT: 27 U/L (ref 0–44)
AST: 24 U/L (ref 15–41)
Albumin: 3.3 g/dL — ABNORMAL LOW (ref 3.5–5.0)
Alkaline Phosphatase: 63 U/L (ref 38–126)
Anion gap: 10 (ref 5–15)
BUN: 12 mg/dL (ref 6–20)
CO2: 23 mmol/L (ref 22–32)
Calcium: 9.3 mg/dL (ref 8.9–10.3)
Chloride: 99 mmol/L (ref 98–111)
Creatinine, Ser: 0.7 mg/dL (ref 0.44–1.00)
GFR, Estimated: >60 mL/min (ref >60)
Glucose, Bld: 190 mg/dL — ABNORMAL HIGH (ref 70–99)
Potassium: 4.3 mmol/L (ref 3.5–5.1)
Sodium: 132 mmol/L — ABNORMAL LOW (ref 135–145)
Total Bilirubin: 1.1 mg/dL (ref 0.3–1.2)
Total Protein: 7 g/dL (ref 6.5–8.1)

## 2020-05-12 LAB — GLUCOSE, CAPILLARY
Glucose-Capillary: 115 mg/dL — ABNORMAL HIGH (ref 70–99)
Glucose-Capillary: 118 mg/dL — ABNORMAL HIGH (ref 70–99)
Glucose-Capillary: 142 mg/dL — ABNORMAL HIGH (ref 70–99)
Glucose-Capillary: 151 mg/dL — ABNORMAL HIGH (ref 70–99)
Glucose-Capillary: 155 mg/dL — ABNORMAL HIGH (ref 70–99)
Glucose-Capillary: 170 mg/dL — ABNORMAL HIGH (ref 70–99)

## 2020-05-12 LAB — PHOSPHORUS: Phosphorus: 2.5 mg/dL (ref 2.5–4.6)

## 2020-05-12 LAB — MAGNESIUM: Magnesium: 1.7 mg/dL (ref 1.7–2.4)

## 2020-05-12 MED ORDER — PROCHLORPERAZINE EDISYLATE 10 MG/2ML IJ SOLN
10.0000 mg | Freq: Four times a day (QID) | INTRAMUSCULAR | Status: DC | PRN
  Administered 2020-05-12: 10 mg via INTRAVENOUS
  Filled 2020-05-12: qty 2

## 2020-05-12 MED ORDER — HYDROMORPHONE HCL 1 MG/ML IJ SOLN
0.5000 mg | INTRAMUSCULAR | Status: AC | PRN
Start: 2020-05-12 — End: 2020-05-13
  Administered 2020-05-12 – 2020-05-13 (×5): 0.5 mg via INTRAVENOUS
  Filled 2020-05-12 (×5): qty 0.5

## 2020-05-12 NOTE — Progress Notes (Signed)
Patient has been experiencing abdominal pain. Nurse had given Dilaudid and Tylenol per order and per patient request, but patient was still having 10/10 abdominal pain. Patient did not have any other pain medication; thus, nurse reached out to the on-call provider about the patient's 10/10 abdominal pain. On-call provider gave an order for gabapentin (Neurontin) capsule 300 mg po once and hyoscyamine (Levbid) 0.375 mg tablet every 12 hours PRN for cramping, abdominal pain.

## 2020-05-12 NOTE — Plan of Care (Signed)
  Problem: Education: Goal: Knowledge of General Education information will improve Description: Including pain rating scale, medication(s)/side effects and non-pharmacologic comfort measures Outcome: Progressing   Problem: Activity: Goal: Risk for activity intolerance will decrease Outcome: Progressing   Problem: Elimination: Goal: Will not experience complications related to urinary retention Outcome: Progressing   Problem: Safety: Goal: Ability to remain free from injury will improve Outcome: Progressing   Problem: Skin Integrity: Goal: Risk for impaired skin integrity will decrease Outcome: Progressing   

## 2020-05-12 NOTE — Progress Notes (Signed)
Patient's pain level throughout the night required Tylenol, Levbid, 3 doses of Dilaudid, as well as a one time dose of Gabapentin. Patient had an episode of vomiting around 0600, which the patient vomited clear chicken broth.

## 2020-05-12 NOTE — Progress Notes (Signed)
[facility] Subjective: Patient is a 30 year old black female with multiple medical problems including poorly controlled diabetes and severe nausea and vomiting requiring hospitalization. She is awaiting a gastric emptying study to be done. She is on Reglan and Protonix.  She says she has vomited once this morning-she threw up some clear broth. She continues to ask for Dilaudid with to alleviate her abdominal pain.  Her abdominal ultrasound did not show any acute abnormalities and there was no evidence of gallstones.  She came claims she feels slightly better this morning.  Last hemoglobin A1c in January 22 was 10.8.  She had an EEG done in December 2021 by Dr. Levora Angel that showed mild gastritis which was a negative H. Pylori.  Objective: Vital signs in last 24 hours: Temp:  [98.4 F (36.9 C)-99 F (37.2 C)] 98.4 F (36.9 C) (03/26 0409) Pulse Rate:  [88-107] 95 (03/26 0409) Resp:  [14-20] 14 (03/26 0409) BP: (101-147)/(62-106) 117/79 (03/26 0409) SpO2:  [96 %-100 %] 96 % (03/26 0409) Last BM Date: 05/10/20  Intake/Output from previous day: 03/25 0701 - 03/26 0700 In: 3787.6 [P.O.:660; I.V.:2561.3; IV Piggyback:566.3] Out: 4001 [Urine:3300; Emesis/NG output:701] Intake/Output this shift: Total I/O In: -  Out: 900 [Urine:900]  General appearance: cooperative, appears stated age, fatigued and mild distress Resp: clear to auscultation bilaterally Cardio: regular rate and rhythm, S1, S2 normal, no murmur, click, rub or gallop GI: soft, non-tender; bowel sounds normal; no masses,  no organomegaly Extremities: extremities normal, atraumatic, no cyanosis or edema  Lab Results: Recent Labs    05/09/20 1728 05/10/20 0900 05/11/20 0525  WBC 9.8 13.4* 8.2  HGB 12.2 10.3* 10.4*  HCT 37.0 31.7* 31.6*  PLT 402* 320 318   BMET Recent Labs    05/09/20 1728 05/10/20 0457 05/11/20 0525  NA 138 135 136  K 4.0 4.3 3.8  CL 98 101 105  CO2 27 23 23   GLUCOSE 264* 260* 189*   BUN 27* 27* 14  CREATININE 0.95 1.10* 0.59  CALCIUM 9.8 8.2* 8.6*   LFT Recent Labs    05/11/20 0525  PROT 6.1*  ALBUMIN 3.0*  AST 28  ALT 28  ALKPHOS 54  BILITOT 0.9   Studies/Results: 05/13/20 Abdomen Limited RUQ (LIVER/GB)  Result Date: 05/11/2020 CLINICAL DATA:  Abdominal pain EXAM: ULTRASOUND ABDOMEN LIMITED RIGHT UPPER QUADRANT COMPARISON:  None. FINDINGS: Gallbladder: No gallstones or wall thickening visualized. No sonographic Murphy sign noted by sonographer. Common bile duct: Diameter: 2 mm Liver: No focal lesion identified. Within normal limits in parenchymal echogenicity. Portal vein is patent on color Doppler imaging with normal direction of blood flow towards the liver. Other: None. IMPRESSION: No acute abnormality noted. Electronically Signed   By: 05/13/2020 M.D.   On: 05/11/2020 20:43   Medications: I have reviewed the patient's current medications.  Assessment/Plan: 1) Intractable nausea and vomiting with epigastric pain-patient is awaiting a gastric emptying study to rule out gastroparesis. 2) Poorly controlled insulin-dependent diabetes. 3) Bladder distention admission ?neurogenic bladder. 4) Mild normocytic anemia.  LOS: 1 day   05/13/2020 05/12/2020, 8:46 AM

## 2020-05-12 NOTE — Progress Notes (Signed)
PROGRESS NOTE    Kelli Hudson  ZOX:[id number] DOB: 1990-08-31 DOA: 05/09/2020 PCP: Mliss Sax, MD  Brief Narrative: The patient is a 30 year old African-American female with a past medical history significant for but not limited to diabetes mellitus type 1 with gastroparesis, hypertension, history of H. pylori infection, history of hyperthyroidism, history of GERD, history of gastroparesis as well as other comorbidities including amputation of the left fifth toe who presented for the evaluation of nausea vomiting as well as abdominal pain.  She presented to the emergency room with complaint of diffuse abdominal pain and persistent nausea vomiting that began in the morning and she does not remember any inciting event.  She states that she has had too numerous to count episodes of nausea vomiting has not had any coffee ground emesis with the vomiting but continued to have diffuse severe abdominal pain that did not radiate into her chest or pelvis.  She denied any diarrhea but she stated that she did notice some blood tinged discoloration in her emesis after several episodes of vomiting.  She is not having any fevers and does have a history of this in the past for which she has been hospitalized.  She has been given the working diagnosis of diabetic gastroparesis however has not been formally tested for this and her gastric emptying study to be done next week.  She feels that this is similar to her previous episodes and she is recently evaluated by [facility] was prescribed a medication last week which she does not remember but states that it did not help very much.  In the ED she is found to have a normal WBC and a normal x-ray of her abdomen.  She was admitted to the hospitalist service and also noted to have acute urinary retention as she was holding onto her urine and had at least 1.1 L noted on her CT scan abdomen pelvis.  She was steadily improving and her diet was advanced however  this morning she worsened and became extremely nauseous and started vomiting and had significant abdominal pain similar to her admission presentation.  She is given antiemetics and her pain medication was renewed and GI was consulted for further evaluation recommendations and she was also started on scheduled metoclopramide IV 10 mg every 8h.  Gastroenterology started her on IV erythromycin dicyclomine 0.375 mg p.o. every 12 as needed cramping abdominal pain.  7 tractable nausea vomiting and now continues abdominal pain so her pain medications have been renewed.  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  -Pt is continued on basal insulin and currently on 13 units subcu nightly. Initiated on Moderate NovoLog sliding scale insulin AC for glycemic control. -Checked hemoglobin A1c and was 11.5 -She stake Tresiba 13 units qHS but not any Rapid-Acting Insulin; TOC has been Consulted by the Diabetes Patent attorney for Copay Cost of Rapid Acting Insulin -Patient with known history of gastroparesis was given dose of Haldol and Reglan in the emergency room but continues to have symptoms. -To monitor CBGs per protocol and CBGs have been ranging from 119-295; initial blood sugars less than 200 -Patient has recently seen gastroenterology as an  outpatient and states she was given a medication but she does not member the name and it was not very helpful she states. -We will initiate metoclopramide IV 10 mg every 8 scheduled continue for now; gastroenterology has started the patient on erythromycin to 50 mg every 8 hours as scheduled and have  also started the patient on 4 mg p.o./IV every 6 standing Zofran and because she continues to have intractable nausea vomiting we will add IV Compazine 10 mg every 6 as needed for refractory nausea vomiting -Because she worsened today Gastroenterology will be consulted for formal evaluation and appreciate further evaluation -Continues to have significant abdominal pain so we will continue with 0.5 mg of hydromorphone every 3 hours as needed for severe pain and limit the amount of narcotics that the patient gets -GI recommend weaning off narcotics if possible and adding back IV Protonix and we have doubled the dose of IV Protonix given that she continues have some burning sensation in her chest -GI discussed about her right upper quadrant ultrasound and they do not feel the patient needed 1 but they ordered it to screen for gallstones and this was negative  Intractable Nausea and Vomiting  -C/w Antiemetics with po/IV Ondansetron q6hprn Nausea but GI scheduled this and I added IV Compazine today given her intractable nausea vomiting still. -IV fluid hydration with normal saline overnight and we reduced this to 100 mL/h and will continue with IV fluid hydration at this time -She was given metoclopramide for nausea and vomiting refractory to ondansetron and this has been renewed at 10 mg IV every 8 hours scheduled -Diet was cut back to clear liquid diet -We will formally consult Gastroenterology for further evaluation recommendations given that the patient has worsened today -see above patient continues have significant abdominal pain  Essential Hypertension -Continue home medications Atenolol 25 mg po BID and Lisinopril 5 mg po Daily -Continue to Monitor BP per protocol -Last BP was 128/90 this AM   AKI -In the setting of Nausea and Vomiting -Urinalysis was relatively unremarkable and showed a clear appearance with negative leukocytes, negative nitrates, rare bacteria, 0-5 RBCs per high-power  field, 0-5 squamous epithelial cells, and 0-5 WBCs but urine culture is growing out greater than 100,000 colony-forming units of lactobacillus Speices -Will not Treat  -Patient's BUN/Cr went from 27/0.95 -> 27/1.10 -> 14/0.59 and today patient's BUN/creatinine is 12/0.70 -IVF Hydration as below -Avoid Nephrotoxic Medications, Contrast Dyes, Hypotension and Renally adjust Medications -Repeat CMP in the AM   Normocytic Anemia -Patient's Hgb/Hct went from 12.2/37.0 -> 10.3/31.7 -> 10.4/31.6 and today it is improved to 12.2/36.6 -Likely Dilutional Drop -Checked Anemia Panel and it showed an iron level of 99, U IBC of 359, TIBC of 458, saturation ratio of 22%, ferritin level 27, folate level 21.0, vitamin B12 547 -Continue to Monitor for S/Sx of Bleeding; No overt bleeding noted but she did have some blood in vomiting previously -GI has been consulted as above for her intractable nausea vomiting -Repeat CBC in the AM   Abdominal Pain, worsened -In the setting of Suspected Gastroparesis  -Patient was given Hydromorphone 1 mg x1 overnight and a dose of IV Morphine 4 mg; She had 1mg  of IV Hyrdromorphone q3hprn Severe Pain -Pt has hx of pylori infection and she states she has had ulcers in the past.  - Will obtain CT of the abdomen to make sure there is not any acute pathology; CT Abd/Pelvis showed "No acute abdominal/pelvic findings, mass lesions or adenopathy. Marked  distention of the bladder almost up to the umbilicus. Bladder volume is estimated at 1.1 L." - X-ray series done in the emergency room was negative and showed "Negative abdominal radiographs.  No acute cardiopulmonary disease." -Because she had Extreme abdominal Pain will consult Gastroenterology for further evaluation and recommendations  Tachycardia -Patient with Sinus Tachycardia secondary to pain and probable volume loss with persistent nausea and vomiting.  She continues to have nausea vomiting today -IV fluid hydration provided  and she is getting 150 mL of normal saline per hour but will change this to 100 and mils per hour for now; Received a bolus of NS 1,000 mL in the ED -Continue to Monitor on Telemetry; Last Documented HR was 96  Acute Urinary Retention improved -CT scan on admission showed that the bladder volume was estimated to be 1.1 L -Bladder scan done yesterday morning showed the patient was retaining 450 mL  -INO cath done and she may need a Foley catheter -Check a urinalysis and urine culture; urinalysis showed a clear appearance of her urine with greater than 500 glucose, negative hemoglobin, 20 ketones, rare bacteria, 0-5 squamous epithelial cells, 0-5 RBCs per high-power field and 0-5 WBCs with urine culture pending -We will check bladder scans every 6h -Continue monitor PVRs and her last PVR this morning was 4 mL  Leukocytosis, improved but slightly trending back up again -In the setting of nausea vomiting -Continue monitor carefully her she was also holding onto her urine and retaining -CT of the abdomen and pelvis showed "No acute abdominal/pelvic findings, mass lesions or adenopathy. Marked distention of the bladder almost up to the umbilicus. Bladder volume is estimated at 1.1 L." -Continue to monitor for signs and symptoms of infection and currently holding off on antibiotics for now -WBC went from 9.8 -> 13.4 -> is now improved to 8.2 slightly trending up and went to 10.1 -Repeat CBC in a.m.  DVT prophylaxis: SCDs Code Status: FULL CODE  Family Communication: No family present at bedside  Disposition Plan: Pending further clinical improvement and tolerance of Diet   Status is: Inpatient  Remains inpatient appropriate because:Unsafe d/c plan, IV treatments appropriate due to intensity of illness or inability to take PO and Inpatient level of care appropriate due to severity of illness   Dispo: The patient is from: Home              Anticipated d/c is to: Home              Patient  currently is not medically stable to d/c.   Difficult to place patient No  Consultants:   Gastroenterology   Procedures: None  Antimicrobials:  Anti-infectives (From admission, onward)   Start     Dose/Rate Route Frequency Ordered Stop   05/11/20 1300  erythromycin 250 mg in sodium chloride 0.9 % 100 mL IVPB        250 mg 100 mL/hr over 60 Minutes Intravenous Every 8 hours 05/11/20 1201 05/15/20 1359        Subjective: Seen and examined at bedside and feels bad and continues to have intractable nausea vomiting and abdominal pain.  She states that she vomited his morning and feels a little bit better but not as much and continues to complain of burning and discomfort in her chest.  Labs are fine however she continues to not feel well and continues to vomit.  No other concerns or complaints this time and is asking for pain medications now.  Objective: Vitals:  05/12/20 0943 05/12/20 1346 05/12/20 1406 05/12/20 1447  BP: 133/86 (!) 162/103 (!) 170/115 (!) 153/105  Pulse: (!) 103 (!) 108 (!) 108 (!) 105  Resp: 16 20    Temp: 99.4 F (37.4 C) 98.8 F (37.1 C)    TempSrc: Oral Oral    SpO2: 100% 93% 97%   Weight:      Height:        Intake/Output Summary (Last 24 hours) at 05/12/2020 1600 Last data filed at 05/12/2020 4315 Gross per 24 hour  Intake 3727.6 ml  Output 3300 ml  Net 427.6 ml   Filed Weights   05/09/20 1722  Weight: 72.6 kg   Examination: Physical Exam:  Constitutional: WN/WD ill-appearing African-American female who was nauseous and complaining of some abdominal pain and discomfort as well as burning in the chest Eyes: Lids and conjunctivae normal, sclerae anicteric  ENMT: External Ears, Nose appear normal. Grossly normal hearing.  Neck: Appears normal, supple, no cervical masses, normal ROM, no appreciable thyromegaly; no JVD Respiratory: Clear to auscultation bilaterally, no wheezing, rales, rhonchi or crackles. Normal respiratory effort and patient is  not tachypenic. No accessory muscle use.  Cardiovascular: Tachycardic rate but regular rhythm, no murmurs / rubs / gallops. S1 and S2 auscultated.  No appreciable extremity edema Abdomen: Soft, tender to palpate, non-distended. Bowel sounds positive.  GU: Deferred. Musculoskeletal: No clubbing / cyanosis of digits/nails. No joint deformity upper and lower extremities.  Skin: No rashes, lesions, ulcers on limited skin evaluation. No induration; Warm and dry.  Neurologic: CN 2-12 grossly intact with no focal deficits. Romberg sign and cerebellar reflexes not assessed.  Psychiatric: Normal judgment and insight. Alert and oriented x 3. Anxious mood and appropriate affect.   Data Reviewed: I have personally reviewed following labs and imaging studies  CBC: Recent Labs  Lab 05/09/20 1728 05/10/20 0900 05/11/20 0525 05/12/20 1024  WBC 9.8 13.4* 8.2 10.1  NEUTROABS  --  8.6* 3.4 7.8*  HGB 12.2 10.3* 10.4* 12.2  HCT 37.0 31.7* 31.6* 36.6  MCV 89.4 92.2 92.4 87.6  PLT 402* 320 318 362   Basic Metabolic Panel: Recent Labs  Lab 05/09/20 1728 05/10/20 0457 05/11/20 0525 05/12/20 1024  NA 138 135 136 132*  K 4.0 4.3 3.8 4.3  CL 98 101 105 99  CO2 27 23 23 23   GLUCOSE 264* 260* 189* 190*  BUN 27* 27* 14 12  CREATININE 0.95 1.10* 0.59 0.70  CALCIUM 9.8 8.2* 8.6* 9.3  MG  --   --  1.8 1.7  PHOS  --   --  2.8 2.5   GFR: Estimated Creatinine Clearance: 105.6 mL/min (by C-G formula based on SCr of 0.7 mg/dL). Liver Function Tests: Recent Labs  Lab 05/09/20 1728 05/11/20 0525 05/12/20 1024  AST 31 28 24   ALT 36 28 27  ALKPHOS 81 54 63  BILITOT 0.8 0.9 1.1  PROT 7.8 6.1* 7.0  ALBUMIN 3.9 3.0* 3.3*   Recent Labs  Lab 05/09/20 1728  LIPASE 25   No results for input(s): AMMONIA in the last 168 hours. Coagulation Profile: No results for input(s): INR, PROTIME in the last 168 hours. Cardiac Enzymes: No results for input(s): CKTOTAL, CKMB, CKMBINDEX, TROPONINI in the last 168  hours. BNP (last 3 results) No results for input(s): PROBNP in the last 8760 hours. HbA1C: Recent Labs    05/09/20 1728  HGBA1C 11.5*   CBG: Recent Labs  Lab 05/11/20 2051 05/11/20 2353 05/12/20 0412 05/12/20 0733 05/12/20 1134  GLUCAP 176* 183* 142* 155* 170*   Lipid Profile: No results for input(s): CHOL, HDL, LDLCALC, TRIG, CHOLHDL, LDLDIRECT in the last 72 hours. Thyroid Function Tests: No results for input(s): TSH, T4TOTAL, FREET4, T3FREE, THYROIDAB in the last 72 hours. Anemia Panel: Recent Labs    05/11/20 0525  VITAMINB12 547  FOLATE 21.0  FERRITIN 27  TIBC 458*  IRON 99  RETICCTPCT 2.4   Sepsis Labs: No results for input(s): PROCALCITON, LATICACIDVEN in the last 168 hours.  Recent Results (from the past 240 hour(s))  SARS CORONAVIRUS 2 (TAT 6-24 HRS) Nasopharyngeal Nasopharyngeal Swab     Status: None   Collection Time: 05/09/20  8:53 PM   Specimen: Nasopharyngeal Swab  Result Value Ref Range Status   SARS Coronavirus 2 NEGATIVE NEGATIVE Final    Comment: (NOTE) SARS-CoV-2 target nucleic acids are NOT DETECTED.  The SARS-CoV-2 RNA is generally detectable in upper and lower respiratory specimens during the acute phase of infection. Negative results do not preclude SARS-CoV-2 infection, do not rule out co-infections with other pathogens, and should not be used as the sole basis for treatment or other patient management decisions. Negative results must be combined with clinical observations, patient history, and epidemiological information. The expected result is Negative.  Fact Sheet for Patients: HairSlick.nohttps://www.fda.gov/media/138098/download  Fact Sheet for Healthcare Providers: quierodirigir.comhttps://www.fda.gov/media/138095/download  This test is not yet approved or cleared by the [place] FDA and  has been authorized for detection and/or diagnosis of SARS-CoV-2 by FDA under an Emergency Use Authorization (EUA). This EUA will remain  in effect (meaning this  test can be used) for the duration of the COVID-19 declaration under Se ction 564(b)(1) of the Act, 21 U.S.C. section 360bbb-3(b)(1), unless the authorization is terminated or revoked sooner.  Performed at [facility], [address] [place], [place] [address]   Culture, Urine     Status: Abnormal   Collection Time: 05/10/20 11:53 AM   Specimen: Urine, Catheterized  Result Value Ref Range Status   Specimen Description URINE, CATHETERIZED  Final   Special Requests NONE  Final   Culture (A)  Final    >=100,000 COLONIES/mL LACTOBACILLUS SPECIES Standardized susceptibility testing for this organism is not available.    Report Status 05/11/2020 FINAL  Final    RN Pressure Injury Documentation:     Estimated body mass index is 24.33 kg/m as calculated from the following:   Height as of this encounter: 5\' 8"  (1.727 m).   Weight as of this encounter: 72.6 kg.  Malnutrition Type:   Malnutrition Characteristics:   Nutrition Interventions:   Radiology Studies: US Abdomen Limited RUQ (LIVER/GB)  Result Date: 05/11/2020 CLINICAL DATA:  Abdominal pain EXAM: ULTRASOUND ABDOMEN LIMITED RIGHT UPPER QUADRANT COMPARISON:  None. FINDINGS: Gallbladder: No gallstones or wall thickening visualized. No sonographic Murphy sign noted by sonographer. Common bile duct: Diameter: 2 mm Liver: No focal lesion identified. Within normal limits in parenchymal echogenicity. Portal vein is patent on color Doppler imaging with normal direction of blood flow towards the liver. Other: None. IMPRESSION: No acute abnormality noted. Electronically Signed   By: Alcide CleverMark  Lukens M.D.   On: 05/11/2020 20:43   Scheduled Meds: . atenolol  25 mg Oral BID  . insulin aspart  0-15 Units Subcutaneous TID WC  . insulin glargine  13 Units Subcutaneous QHS  . lisinopril  5 mg Oral Daily  . metoCLOPramide (REGLAN) injection  10 mg Intravenous Q8H  . ondansetron (ZOFRAN) IV  4 mg Intravenous Q6H  Or  . ondansetron  4 mg  Oral Q6H  . pantoprazole (PROTONIX) IV  40 mg Intravenous Q12H   Continuous Infusions: . sodium chloride 100 mL/hr at 05/12/20 1046  . erythromycin 250 mg (05/12/20 1412)    LOS: 1 day   Merlene Laughter, DO [facility] PAGER is on AMION  If 7PM-7AM, please contact night-coverage [email or web address]

## 2020-05-13 LAB — CBC WITH DIFFERENTIAL/PLATELET
Abs Immature Granulocytes: 0.02 10*3/uL (ref 0.00–0.07)
Basophils Absolute: 0 10*3/uL (ref 0.0–0.1)
Basophils Relative: 0 %
Eosinophils Absolute: 0 10*3/uL (ref 0.0–0.5)
Eosinophils Relative: 0 %
HCT: 38.6 % (ref 36.0–46.0)
Hemoglobin: 12.8 g/dL (ref 12.0–15.0)
Immature Granulocytes: 0 %
Lymphocytes Relative: 18 %
Lymphs Abs: 1.7 10*3/uL (ref 0.7–4.0)
MCH: 29.7 pg (ref 26.0–34.0)
MCHC: 33.2 g/dL (ref 30.0–36.0)
MCV: 89.6 fL (ref 80.0–100.0)
Monocytes Absolute: 0.6 10*3/uL (ref 0.1–1.0)
Monocytes Relative: 6 %
Neutro Abs: 7.1 10*3/uL (ref 1.7–7.7)
Neutrophils Relative %: 76 %
Platelets: 384 10*3/uL (ref 150–400)
RBC: 4.31 MIL/uL (ref 3.87–5.11)
RDW: 12.4 % (ref 11.5–15.5)
WBC: 9.4 10*3/uL (ref 4.0–10.5)
nRBC: 0 % (ref 0.0–0.2)

## 2020-05-13 LAB — MAGNESIUM: Magnesium: 1.6 mg/dL — ABNORMAL LOW (ref 1.7–2.4)

## 2020-05-13 LAB — GLUCOSE, CAPILLARY
Glucose-Capillary: 109 mg/dL — ABNORMAL HIGH (ref 70–99)
Glucose-Capillary: 130 mg/dL — ABNORMAL HIGH (ref 70–99)
Glucose-Capillary: 138 mg/dL — ABNORMAL HIGH (ref 70–99)
Glucose-Capillary: 142 mg/dL — ABNORMAL HIGH (ref 70–99)
Glucose-Capillary: 210 mg/dL — ABNORMAL HIGH (ref 70–99)
Glucose-Capillary: 215 mg/dL — ABNORMAL HIGH (ref 70–99)

## 2020-05-13 LAB — COMPREHENSIVE METABOLIC PANEL
ALT: 29 U/L (ref 0–44)
AST: 34 U/L (ref 15–41)
Albumin: 3.4 g/dL — ABNORMAL LOW (ref 3.5–5.0)
Alkaline Phosphatase: 63 U/L (ref 38–126)
Anion gap: 14 (ref 5–15)
BUN: 10 mg/dL (ref 6–20)
CO2: 20 mmol/L — ABNORMAL LOW (ref 22–32)
Calcium: 9.4 mg/dL (ref 8.9–10.3)
Chloride: 97 mmol/L — ABNORMAL LOW (ref 98–111)
Creatinine, Ser: 0.57 mg/dL (ref 0.44–1.00)
GFR, Estimated: >60 mL/min (ref >60)
Glucose, Bld: 227 mg/dL — ABNORMAL HIGH (ref 70–99)
Potassium: 5 mmol/L (ref 3.5–5.1)
Sodium: 131 mmol/L — ABNORMAL LOW (ref 135–145)
Total Bilirubin: 1.6 mg/dL — ABNORMAL HIGH (ref 0.3–1.2)
Total Protein: 7.4 g/dL (ref 6.5–8.1)

## 2020-05-13 LAB — PHOSPHORUS: Phosphorus: 3 mg/dL (ref 2.5–4.6)

## 2020-05-13 MED ORDER — MAGNESIUM SULFATE 2 GM/50ML IV SOLN
2.0000 g | Freq: Once | INTRAVENOUS | Status: AC
  Administered 2020-05-13: 2 g via INTRAVENOUS
  Filled 2020-05-13: qty 50

## 2020-05-13 MED ORDER — HYDROMORPHONE HCL 1 MG/ML IJ SOLN
0.5000 mg | INTRAMUSCULAR | Status: AC | PRN
Start: 2020-05-13 — End: 2020-05-13
  Administered 2020-05-13 (×5): 0.5 mg via INTRAVENOUS
  Filled 2020-05-13 (×5): qty 0.5

## 2020-05-13 MED ORDER — FAMOTIDINE IN NACL 20-0.9 MG/50ML-% IV SOLN
20.0000 mg | Freq: Two times a day (BID) | INTRAVENOUS | Status: DC
  Administered 2020-05-13 – 2020-05-14 (×3): 20 mg via INTRAVENOUS
  Filled 2020-05-13 (×3): qty 50

## 2020-05-13 NOTE — Progress Notes (Signed)
[facility]OSS [facility]OVER [facility] Subjective: Patient seems to be doing just about the same as she was yesterday.  She is thrown up once this morning and brought up clear broth that she ate earlier.  She continues to require significant want of narcotics for abdominal pain.  Her abdominal ultrasound was normal and she is scheduled for gastric emptying at the end of this month.  Objective: Vital signs in last 24 hours: Temp:  [98.4 F (36.9 C)-99.4 F (37.4 C)] 98.6 F (37 C) (03/26 2024) Pulse Rate:  [94-108] 98 (03/26 2024) Resp:  [14-20] 18 (03/26 2024) BP: (116-170)/(75-115) 126/81 (03/26 2024) SpO2:  [93 %-100 %] 98 % (03/26 2024) Last BM Date: 05/11/20  Intake/Output from previous day: 03/26 0701 - 03/27 0700 In: -  Out: 900 [Urine:900] Intake/Output this shift: No intake/output data recorded.  General appearance: alert, cooperative, appears stated age, fatigued and no distress Resp: clear to auscultation bilaterally Cardio: regular rate and rhythm, S1, S2 normal, no murmur, click, rub or gallop GI: soft, non-tender; bowel sounds normal; no masses,  no organomegaly Extremities: extremities normal, atraumatic, no cyanosis or edema  Lab Results: Recent Labs    05/10/20 0900 05/11/20 0525 05/12/20 1024  WBC 13.4* 8.2 10.1  HGB 10.3* 10.4* 12.2  HCT 31.7* 31.6* 36.6  PLT 320 318 362   BMET Recent Labs    05/10/20 0457 05/11/20 0525 05/12/20 1024  NA 135 136 132*  K 4.3 3.8 4.3  CL 101 105 99  CO2 23 23 23   GLUCOSE 260* 189* 190*  BUN 27* 14 12  CREATININE 1.10* 0.59 0.70  CALCIUM 8.2* 8.6* 9.3   LFT Recent Labs    05/12/20 1024  PROT 7.0  ALBUMIN 3.3*  AST 24  ALT 27  ALKPHOS 63  BILITOT 1.1   PT/INR No results for input(s): LABPROT, INR in the last 72 hours. Hepatitis Panel No results for input(s): HEPBSAG, HCVAB, HEPAIGM, HEPBIGM in the last 72 hours. C-Diff No results for input(s): CDIFFTOX in the last 72 hours. No results for input(s): CDIFFPCR in the  last 72 hours. Fecal Lactopherrin No results for input(s): FECLLACTOFRN in the last 72 hours.  Studies/Results: 05/14/20 Abdomen Limited RUQ (LIVER/GB)  Result Date: 05/11/2020 CLINICAL DATA:  Abdominal pain EXAM: ULTRASOUND ABDOMEN LIMITED RIGHT UPPER QUADRANT COMPARISON:  None. FINDINGS: Gallbladder: No gallstones or wall thickening visualized. No sonographic Murphy sign noted by sonographer. Common bile duct: Diameter: 2 mm Liver: No focal lesion identified. Within normal limits in parenchymal echogenicity. Portal vein is patent on color Doppler imaging with normal direction of blood flow towards the liver. Other: None. IMPRESSION: No acute abnormality noted. Electronically Signed   By: 05/13/2020 M.D.   On: 05/11/2020 20:43    Medications: I have reviewed the patient's current medications.  Assessment/Plan: 1) Intractable nausea and vomiting with epigastric pain-patient is awaiting a gastric emptying study to rule out gastroparesis. 2) Poorly controlled insulin-dependent diabetes. 3) Bladder distention admission ?neurogenic bladder. 4) Mild normocytic anemia.  LOS: 2 days   05/13/2020 05/13/2020, 12:39 AM

## 2020-05-13 NOTE — Plan of Care (Signed)

## 2020-05-13 NOTE — Progress Notes (Signed)
PROGRESS NOTE    Kelli Hudson  ZOX:[id number] DOB: [birth year] DOA: 05/09/2020 PCP: Mliss Sax, MD  Brief Narrative: The patient is a 30 year old African-American female with a past medical history significant for but not limited to diabetes mellitus type 1 with gastroparesis, hypertension, history of H. pylori infection, history of hyperthyroidism, history of GERD, history of gastroparesis as well as other comorbidities including amputation of the left fifth toe who presented for the evaluation of nausea vomiting as well as abdominal pain.  She presented to the emergency room with complaint of diffuse abdominal pain and persistent nausea vomiting that began in the morning and she does not remember any inciting event.  She states that she has had too numerous to count episodes of nausea vomiting has not had any coffee ground emesis with the vomiting but continued to have diffuse severe abdominal pain that did not radiate into her chest or pelvis.  She denied any diarrhea but she stated that she did notice some blood tinged discoloration in her emesis after several episodes of vomiting.  She is not having any fevers and does have a history of this in the past for which she has been hospitalized.  She has been given the working diagnosis of diabetic gastroparesis however has not been formally tested for this and her gastric emptying study to be done next week.  She feels that this is similar to her previous episodes and she is recently evaluated by [facility] was prescribed a medication last week which she does not remember but states that it did not help very much.  In the ED she is found to have a normal WBC and a normal x-ray of her abdomen.  She was admitted to the hospitalist service and also noted to have acute urinary retention as she was holding onto her urine and had at least 1.1 L noted on her CT scan abdomen pelvis.  She was steadily improving and her diet was advanced however  this morning she worsened and became extremely nauseous and started vomiting and had significant abdominal pain similar to her admission presentation.  She is given antiemetics and her pain medication was renewed and GI was consulted for further evaluation recommendations and she was also started on scheduled metoclopramide IV 10 mg every 8h.  Gastroenterology started her on IV erythromycin and Levbid 0.375 mg p.o. every 12 as needed cramping abdominal pain. She continues to have intractable nausea vomiting and now continues abdominal pain so her pain medications have been renewed.  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  -Pt is continued on basal insulin and currently on 13 units subcu nightly. Initiated on Moderate NovoLog sliding scale insulin AC for glycemic control. -Checked hemoglobin A1c and was 11.5 -She stake Tresiba 13 units qHS but not any Rapid-Acting Insulin; TOC has been Consulted by the Diabetes Patent attorney for Copay Cost of Rapid Acting Insulin -Patient with known history of gastroparesis was given dose of Haldol and Reglan in the emergency room but continues to have symptoms. -To monitor CBGs per protocol and  CBGs have been ranging from 115-215; initial blood sugars less than 200 -Patient has recently seen gastroenterology as an outpatient and states she was given a medication but she does not member the name and it was not very helpful she states. -We will initiate metoclopramide IV 10 mg every 8 scheduled continue for now;  gastroenterology has started the patient on erythromycin to 50 mg every 8 hours as scheduled and have also started the patient on 4 mg p.o./IV every 6 standing Zofran and because she continues to have intractable nausea vomiting we will add IV Compazine 10 mg every 6 as needed for refractory nausea vomiting -Because she worsened today Gastroenterology will be consulted for formal evaluation and appreciate further evaluation -Continues to have significant abdominal pain so we will continue with 0.5 mg of hydromorphone every 3 hours as needed for severe pain and limit the amount of narcotics that the patient gets if able  -GI recommend weaning off narcotics if possible and adding back IV Protonix and we have doubled the dose of IV Protonix given that she continues have some burning sensation in her chest -GI discussed about her right upper quadrant ultrasound and they do not feel the patient needed 1 but they ordered it to screen for gallstones and this was negative for any pathology   Intractable Nausea and Vomiting  -C/w Antiemetics with po/IV Ondansetron q6hprn Nausea but GI scheduled this and I added IV Compazine today given her intractable nausea vomiting still. -IV fluid hydration with normal saline overnight and we reduced this to 100 mL/h and will continue with IV fluid hydration at this time -She was given metoclopramide for nausea and vomiting refractory to ondansetron and this has been renewed at 10 mg IV every 8 hours scheduled -Diet was cut back to clear liquid diet but advanced to FULL Liquid last night and will not advance any further  -She is on IV PPI BID and will add IV Famotidine 20 mg BID -GI consulted for further evaluation and recommendations  -see above patient continues have significant abdominal pain  Hypomagnesemia -Patient's Mag level was 1.6 -Replete with IV Mag Sulfate 2 grams -Continue to Monitor and Replete as Necessary -Repeat Mag Level in the AM   Essential  Hypertension -Continue home medications Atenolol 25 mg po BID and Lisinopril 5 mg po Daily -Continue to Monitor BP per protocol -Last BP was 133/90 this AM   AKI -In the setting of Nausea and Vomiting -Urinalysis was relatively unremarkable and showed a clear appearance with negative leukocytes, negative nitrates, rare bacteria, 0-5 RBCs per high-power field, 0-5 squamous epithelial cells, and 0-5 WBCs but urine culture is growing out greater than 100,000 colony-forming units of lactobacillus Speices -Will not Treat  -Patient's BUN/Cr went from 27/0.95 -> 27/1.10 -> 14/0.59 -> 12/0.70 -> 10/0.57 -IVF Hydration as below -Avoid Nephrotoxic Medications, Contrast Dyes, Hypotension and Renally adjust Medications -Repeat CMP in the AM   Hyperbilirubinemia -Patient's T Bili went from 1.1 -> 1.6 and likely reactive from Nausea and Vomiting -Continue to Monitor and Trend -Repeat CMP in the AM  Normocytic Anemia -Patient's Hgb/Hct went from 12.2/37.0 -> 10.3/31.7 -> 10.4/31.6 -> 12.2/36.6 -> 12.8/38.6 -Likely Dilutional Drop -Checked Anemia Panel and it showed an iron level of 99, U IBC of 359, TIBC of 458, saturation ratio of 22%, ferritin level 27, folate level 21.0, vitamin B12 547 -Continue to Monitor for S/Sx of Bleeding; No overt bleeding noted but she did have some blood in vomiting previously -GI has  been consulted as above for her intractable nausea vomiting -Repeat CBC in the AM   Hyponatremia -Mild as Sodium went from 136 -> 132 -> 131 -IVF as above -Continue to Monitor and Trend -Repeat CBC in the AM  Abdominal Pain, worsened -In the setting of Suspected Gastroparesis  -Patient was given Hydromorphone 1 mg x1 overnight and a dose of IV Morphine 4 mg; She had  of IV Hyrdromorphone q3hprn Severe Pain -Pt has hx of pylori infection and she states she has had ulcers in the past.  - Will obtain CT of the abdomen to make sure there is not any acute pathology; CT Abd/Pelvis showed  "No acute abdominal/pelvic findings, mass lesions or adenopathy. Marked distention of the bladder almost up to the umbilicus. Bladder volume is estimated at 1.1 L." - X-ray series done in the emergency room was negative and showed "Negative abdominal radiographs.  No acute cardiopulmonary disease." -Because she had Extreme abdominal Pain will consult Gastroenterology for further evaluation and recommendations -Continue to Monitor for Retention   Tachycardia -Patient with Sinus Tachycardia secondary to pain and probable volume loss with persistent nausea and vomiting.  She continues to have nausea vomiting today -IV fluid hydration provided and she is getting 150 mL of normal saline per hour but will change this to 100 and mils per hour for now; Received a bolus of NS 1,000 mL in the ED -Continue to Monitor on Telemetry; Last Documented HR was 96  Acute Urinary Retention improved -CT scan on admission showed that the bladder volume was estimated to be 1.1 L -Bladder scan done yesterday morning showed the patient was retaining 450 mL  -INO cath done and she may need a Foley catheter -Check a urinalysis and urine culture; urinalysis showed a clear appearance of her urine with greater than 500 glucose, negative hemoglobin, 20 ketones, rare bacteria, 0-5 squamous epithelial cells, 0-5 RBCs per high-power field and 0-5 WBCs with urine culture pending -We will check bladder scans every 6h -Continue monitor PVRs carefully   Leukocytosis, improved but slightly trending back up again -In the setting of nausea vomiting -Continue monitor carefully her she was also holding onto her urine and retaining -CT of the abdomen and pelvis showed "No acute abdominal/pelvic findings, mass lesions or adenopathy. Marked distention of the bladder almost up to the umbilicus. Bladder volume is estimated at 1.1 L." -Continue to monitor for signs and symptoms of infection and currently holding off on antibiotics for  now -WBC went from 9.8 -> 13.4 -> 8.2 -> 10.1 -> 9.4 -Repeat CBC in a.m.  DVT prophylaxis: SCDs Code Status: FULL CODE  Family Communication: No family present at bedside but spoke to the patient's mother yesterday over the phone  Disposition Plan: Pending further clinical improvement and tolerance of Diet   Status is: Inpatient  Remains inpatient appropriate because:Unsafe d/c plan, IV treatments appropriate due to intensity of illness or inability to take PO and Inpatient level of care appropriate due to severity of illness   Dispo: The patient is from: Home              Anticipated d/c is to: Home              Patient currently is not medically stable to d/c.   Difficult to place patient No  Consultants:   Gastroenterology   Procedures: None  Antimicrobials:  Anti-infectives (From admission, onward)   Start     Dose/Rate Route Frequency Ordered Stop   05/11/20 1300  erythromycin 250 mg in sodium chloride 0.9 % 100 mL IVPB        250 mg 100 mL/hr over 60 Minutes Intravenous Every 8 hours 05/11/20 1201 05/15/20 1359        Subjective: Seen and examined at bedside and states that she vomited once this morning and feels about the same.  States her abdominal pain is still there but not as bad and states that her nausea vomiting is not as bad.  Did not feel very well still and laying in bed.  No chest pain, lightheadedness or dizziness.  No other concerns or complaints this time.  Objective: Vitals:   05/12/20 1851 05/12/20 2024 05/13/20 0419 05/13/20 1233  BP: 116/75 126/81 (!) 166/104 133/90  Pulse: 94 98 99 (!) 101  Resp:  18 15 18   Temp:  98.6 F (37 C) 99.1 F (37.3 C) 99.2 F (37.3 C)  TempSrc:  Oral Oral Oral  SpO2:  98% 97% 99%  Weight:      Height:        Intake/Output Summary (Last 24 hours) at 05/13/2020 1427 Last data filed at 05/13/2020 0900 Gross per 24 hour  Intake 2780.14 ml  Output -  Net 2780.14 ml   Filed Weights   05/09/20 1722  Weight:  72.6 kg   Examination: Physical Exam:  Constitutional: WN/WD ill appearing AAF who is resting and still having some abdominal pain but not as nauseous now Eyes: Lids and conjunctivae normal, sclerae anicteric  ENMT: External Ears, Nose appear normal. Grossly normal hearing. Neck: Appears normal, supple, no cervical masses, normal ROM, no appreciable thyromegaly; no JVD Respiratory: Diminished to auscultation bilaterally, no wheezing, rales, rhonchi or crackles. Normal respiratory effort and patient is not tachypenic. No accessory muscle use. Unlabored breathing  Cardiovascular: RRR, no murmurs / rubs / gallops. S1 and S2 auscultated. No extremity edema. 2+ pedal pulses. No carotid bruits.  Abdomen: Soft, Tender to palpate, non-distended. Bowel sounds positive.  GU: Deferred. Musculoskeletal: No clubbing / cyanosis of digits/nails. No joint deformity upper and lower extremities.  Skin: No rashes, lesions, ulcers on a limited skin evaluation. No induration; Warm and dry.  Neurologic: CN 2-12 grossly intact with no focal deficits. Romberg sign cerebellar reflexes not assessed.  Psychiatric: Normal judgment and insight. Alert and oriented x 3. Mildly anxious mood and appropriate affect.   Data Reviewed: I have personally reviewed following labs and imaging studies  CBC: Recent Labs  Lab 05/09/20 1728 05/10/20 0900 05/11/20 0525 05/12/20 1024 05/13/20 0518  WBC 9.8 13.4* 8.2 10.1 9.4  NEUTROABS  --  8.6* 3.4 7.8* 7.1  HGB 12.2 10.3* 10.4* 12.2 12.8  HCT 37.0 31.7* 31.6* 36.6 38.6  MCV 89.4 92.2 92.4 87.6 89.6  PLT 402* 320 318 362 384   Basic Metabolic Panel: Recent Labs  Lab 05/09/20 1728 05/10/20 0457 05/11/20 0525 05/12/20 1024 05/13/20 0518  NA 138 135 136 132* 131*  K 4.0 4.3 3.8 4.3 5.0  CL 98 101 105 99 97*  CO2 27 23 23 23  20*  GLUCOSE 264* 260* 189* 190* 227*  BUN 27* 27* 14 12 10   CREATININE 0.95 1.10* 0.59 0.70 0.57  CALCIUM 9.8 8.2* 8.6* 9.3 9.4  MG  --   --   1.8 1.7 1.6*  PHOS  --   --  2.8 2.5 3.0   GFR: Estimated Creatinine Clearance: 105.6 mL/min (by C-G formula based on SCr of 0.57 mg/dL). Liver Function Tests: Recent Labs  Lab 05/09/20 1728  05/11/20 0525 05/12/20 1024 05/13/20 0518  AST 31 28 24  34  ALT 36 28 27 29   ALKPHOS 81 54 63 63  BILITOT 0.8 0.9 1.1 1.6*  PROT 7.8 6.1* 7.0 7.4  ALBUMIN 3.9 3.0* 3.3* 3.4*   Recent Labs  Lab 05/09/20 1728  LIPASE 25   No results for input(s): AMMONIA in the last 168 hours. Coagulation Profile: No results for input(s): INR, PROTIME in the last 168 hours. Cardiac Enzymes: No results for input(s): CKTOTAL, CKMB, CKMBINDEX, TROPONINI in the last 168 hours. BNP (last 3 results) No results for input(s): PROBNP in the last 8760 hours. HbA1C: No results for input(s): HGBA1C in the last 72 hours. CBG: Recent Labs  Lab 05/12/20 2025 05/12/20 2338 05/13/20 0419 05/13/20 0756 05/13/20 1108  GLUCAP 118* 115* 210* 215* 142*   Lipid Profile: No results for input(s): CHOL, HDL, LDLCALC, TRIG, CHOLHDL, LDLDIRECT in the last 72 hours. Thyroid Function Tests: No results for input(s): TSH, T4TOTAL, FREET4, T3FREE, THYROIDAB in the last 72 hours. Anemia Panel: Recent Labs    05/11/20 0525  VITAMINB12 547  FOLATE 21.0  FERRITIN 27  TIBC 458*  IRON 99  RETICCTPCT 2.4   Sepsis Labs: No results for input(s): PROCALCITON, LATICACIDVEN in the last 168 hours.  Recent Results (from the past 240 hour(s))  SARS CORONAVIRUS 2 (TAT 6-24 HRS) Nasopharyngeal Nasopharyngeal Swab     Status: None   Collection Time: 05/09/20  8:53 PM   Specimen: Nasopharyngeal Swab  Result Value Ref Range Status   SARS Coronavirus 2 NEGATIVE NEGATIVE Final    Comment: (NOTE) SARS-CoV-2 target nucleic acids are NOT DETECTED.  The SARS-CoV-2 RNA is generally detectable in upper and lower respiratory specimens during the acute phase of infection. Negative results do not preclude SARS-CoV-2 infection, do not rule  out co-infections with other pathogens, and should not be used as the sole basis for treatment or other patient management decisions. Negative results must be combined with clinical observations, patient history, and epidemiological information. The expected result is Negative.  Fact Sheet for Patients: HairSlick.nohttps://www.fda.gov/media/138098/download  Fact Sheet for Healthcare Providers: quierodirigir.comhttps://www.fda.gov/media/138095/download  This test is not yet approved or cleared by the [place] FDA and  has been authorized for detection and/or diagnosis of SARS-CoV-2 by FDA under an Emergency Use Authorization (EUA). This EUA will remain  in effect (meaning this test can be used) for the duration of the COVID-19 declaration under Se ction 564(b)(1) of the Act, 21 U.S.C. section 360bbb-3(b)(1), unless the authorization is terminated or revoked sooner.  Performed at [facility], [address] [place], [place] [address]   Culture, Urine     Status: Abnormal   Collection Time: 05/10/20 11:53 AM   Specimen: Urine, Catheterized  Result Value Ref Range Status   Specimen Description URINE, CATHETERIZED  Final   Special Requests NONE  Final   Culture (A)  Final    >=100,000 COLONIES/mL LACTOBACILLUS SPECIES Standardized susceptibility testing for this organism is not available.    Report Status 05/11/2020 FINAL  Final    RN Pressure Injury Documentation:     Estimated body mass index is 24.33 kg/m as calculated from the following:   Height as of this encounter: 5\' 8"  (1.727 m).   Weight as of this encounter: 72.6 kg.  Malnutrition Type:   Malnutrition Characteristics:   Nutrition Interventions:   Radiology Studies: US Abdomen Limited RUQ (LIVER/GB)  Result Date: 05/11/2020 CLINICAL DATA:  Abdominal pain EXAM: ULTRASOUND ABDOMEN LIMITED RIGHT  UPPER QUADRANT COMPARISON:  None. FINDINGS: Gallbladder: No gallstones or wall thickening visualized. No sonographic Murphy sign noted  by sonographer. Common bile duct: Diameter: 2 mm Liver: No focal lesion identified. Within normal limits in parenchymal echogenicity. Portal vein is patent on color Doppler imaging with normal direction of blood flow towards the liver. Other: None. IMPRESSION: No acute abnormality noted. Electronically Signed   By: Alcide Clever M.D.   On: 05/11/2020 20:43   Scheduled Meds: . atenolol  25 mg Oral BID  . insulin aspart  0-15 Units Subcutaneous TID WC  . insulin glargine  13 Units Subcutaneous QHS  . lisinopril  5 mg Oral Daily  . metoCLOPramide (REGLAN) injection  10 mg Intravenous Q8H  . ondansetron (ZOFRAN) IV  4 mg Intravenous Q6H   Or  . ondansetron  4 mg Oral Q6H  . pantoprazole (PROTONIX) IV  40 mg Intravenous Q12H   Continuous Infusions: . sodium chloride Stopped (05/13/20 0550)  . erythromycin 250 mg (05/13/20 1402)    LOS: 2 days   Merlene Laughter, DO [facility] PAGER is on AMION  If 7PM-7AM, please contact night-coverage [email or web address]

## 2020-05-14 DIAGNOSIS — E1143 Type 2 diabetes mellitus with diabetic autonomic (poly)neuropathy: Principal | ICD-10-CM

## 2020-05-14 DIAGNOSIS — R112 Nausea with vomiting, unspecified: Secondary | ICD-10-CM

## 2020-05-14 DIAGNOSIS — E1165 Type 2 diabetes mellitus with hyperglycemia: Secondary | ICD-10-CM

## 2020-05-14 DIAGNOSIS — E1065 Type 1 diabetes mellitus with hyperglycemia: Secondary | ICD-10-CM

## 2020-05-14 DIAGNOSIS — E0865 Diabetes mellitus due to underlying condition with hyperglycemia: Secondary | ICD-10-CM

## 2020-05-14 LAB — PAP THINPREP ASCUS RFLX HPV RFLX TYPE
C. trachomatis RNA, TMA: NOT DETECTED
N. gonorrhoeae RNA, TMA: NOT DETECTED

## 2020-05-14 LAB — COMPREHENSIVE METABOLIC PANEL
ALT: 25 U/L (ref 0–44)
AST: 26 U/L (ref 15–41)
Albumin: 2.7 g/dL — ABNORMAL LOW (ref 3.5–5.0)
Alkaline Phosphatase: 51 U/L (ref 38–126)
Anion gap: 5 (ref 5–15)
BUN: 11 mg/dL (ref 6–20)
CO2: 25 mmol/L (ref 22–32)
Calcium: 8.9 mg/dL (ref 8.9–10.3)
Chloride: 106 mmol/L (ref 98–111)
Creatinine, Ser: 0.88 mg/dL (ref 0.44–1.00)
GFR, Estimated: >60 mL/min (ref >60)
Glucose, Bld: 111 mg/dL — ABNORMAL HIGH (ref 70–99)
Potassium: 4.2 mmol/L (ref 3.5–5.1)
Sodium: 136 mmol/L (ref 135–145)
Total Bilirubin: 1.2 mg/dL (ref 0.3–1.2)
Total Protein: 5.6 g/dL — ABNORMAL LOW (ref 6.5–8.1)

## 2020-05-14 LAB — CBC WITH DIFFERENTIAL/PLATELET
Abs Immature Granulocytes: 0.01 10*3/uL (ref 0.00–0.07)
Basophils Absolute: 0.1 10*3/uL (ref 0.0–0.1)
Basophils Relative: 1 %
Eosinophils Absolute: 0.2 10*3/uL (ref 0.0–0.5)
Eosinophils Relative: 2 %
HCT: 31.8 % — ABNORMAL LOW (ref 36.0–46.0)
Hemoglobin: 10.5 g/dL — ABNORMAL LOW (ref 12.0–15.0)
Immature Granulocytes: 0 %
Lymphocytes Relative: 50 %
Lymphs Abs: 4.6 10*3/uL — ABNORMAL HIGH (ref 0.7–4.0)
MCH: 29.9 pg (ref 26.0–34.0)
MCHC: 33 g/dL (ref 30.0–36.0)
MCV: 90.6 fL (ref 80.0–100.0)
Monocytes Absolute: 0.8 10*3/uL (ref 0.1–1.0)
Monocytes Relative: 9 %
Neutro Abs: 3.5 10*3/uL (ref 1.7–7.7)
Neutrophils Relative %: 38 %
Platelets: 318 10*3/uL (ref 150–400)
RBC: 3.51 MIL/uL — ABNORMAL LOW (ref 3.87–5.11)
RDW: 12.7 % (ref 11.5–15.5)
WBC: 9.2 10*3/uL (ref 4.0–10.5)
nRBC: 0 % (ref 0.0–0.2)

## 2020-05-14 LAB — MAGNESIUM: Magnesium: 1.8 mg/dL (ref 1.7–2.4)

## 2020-05-14 LAB — GLUCOSE, CAPILLARY
Glucose-Capillary: 112 mg/dL — ABNORMAL HIGH (ref 70–99)
Glucose-Capillary: 142 mg/dL — ABNORMAL HIGH (ref 70–99)
Glucose-Capillary: 165 mg/dL — ABNORMAL HIGH (ref 70–99)

## 2020-05-14 LAB — PHOSPHORUS: Phosphorus: 3.3 mg/dL (ref 2.5–4.6)

## 2020-05-14 MED ORDER — INSULIN REGULAR HUMAN 100 UNIT/ML IJ SOLN: 0.0000 [IU] | Freq: Three times a day (TID) | INTRAMUSCULAR | 0 refills | Status: DC

## 2020-05-14 MED ORDER — PROSOURCE PLUS PO LIQD: 30.0000 mL | Freq: Two times a day (BID) | ORAL | Status: DC

## 2020-05-14 MED ORDER — HYOSCYAMINE SULFATE ER 0.375 MG PO TB12: 0.3750 mg | ORAL_TABLET | Freq: Two times a day (BID) | ORAL | 0 refills | Status: DC | PRN

## 2020-05-14 MED ORDER — GLUCERNA SHAKE PO LIQD: 237.0000 mL | ORAL | Status: DC

## 2020-05-14 MED ORDER — PANTOPRAZOLE SODIUM 40 MG PO TBEC: 40.0000 mg | DELAYED_RELEASE_TABLET | Freq: Two times a day (BID) | ORAL | 0 refills | Status: DC

## 2020-05-14 MED ORDER — METOCLOPRAMIDE HCL 5 MG PO TABS: 5.0000 mg | ORAL_TABLET | Freq: Three times a day (TID) | ORAL | 6 refills | Status: DC

## 2020-05-14 MED ORDER — FAMOTIDINE 20 MG PO TABS: 20.0000 mg | ORAL_TABLET | Freq: Two times a day (BID) | ORAL | 11 refills | Status: DC

## 2020-05-14 MED ORDER — PROMETHAZINE HCL 12.5 MG PO TABS: 12.5000 mg | ORAL_TABLET | Freq: Four times a day (QID) | ORAL | 0 refills | Status: DC | PRN

## 2020-05-14 NOTE — Progress Notes (Addendum)
Attending physician's note   I have taken an interval history, reviewed the chart and examined the patient. I agree with the Advanced Practitioner's note, impression, and recommendations as outlined.   Patient feeling much better today and hopeful for discharge to home.  Okay to discharge home from a GI standpoint with plan for GES later this week as scheduled along with follow-up with [name] in the clinic.  Otherwise, GI service will sign off at this time.  Please do not hesitate to contact with additional questions or concerns.  Kelli Tucci, DO, FACG [phone] office             Progress Note   Subjective  Chief Complaint: Gastroparesis   This morning patient tells me she feels much much better.  She is very happy as she is able to keep down soft foods over the past 24 hours.  Tells me she is having minimal to no abdominal pain and feels great.   Objective   Vital signs in last 24 hours: Temp:  [98.6 F (37 C)-98.7 F (37.1 C)] 98.7 F (37.1 C) (03/28 1208) Pulse Rate:  [90-95] 90 (03/28 1208) Resp:  [16-20] 20 (03/28 1208) BP: (112-134)/(75-91) 125/86 (03/28 1208) SpO2:  [98 %-100 %] 99 % (03/28 1208) Last BM Date: 05/13/20 General:    AA female in NAD Heart:  Regular rate and rhythm; no murmurs Lungs: Respirations even and unlabored, lungs CTA bilaterally Abdomen:  Soft, nontender and nondistended. Normal bowel sounds. Psych:  Cooperative. Normal mood and affect.  Intake/Output from previous day: 03/27 0701 - 03/28 0700 In: 1670.2 [P.O.:25; I.V.:1262.7; IV Piggyback:382.5] Out: -  Intake/Output this shift: Total I/O In: 240 [P.O.:240] Out: -   Lab Results: Recent Labs    05/12/20 1024 05/13/20 0518 05/14/20 0425  WBC 10.1 9.4 9.2  HGB 12.2 12.8 10.5*  HCT 36.6 38.6 31.8*  PLT 362 384 318   BMET Recent Labs    05/12/20 1024 05/13/20 0518 05/14/20 0425  NA 132* 131* 136  K 4.3 5.0 4.2  CL 99 97* 106  CO2 23 20* 25  GLUCOSE  190* 227* 111*  BUN 12 10 11   CREATININE 0.70 0.57 0.88  CALCIUM 9.3 9.4 8.9   LFT Recent Labs    05/14/20 0425  PROT 5.6*  ALBUMIN 2.7*  AST 26  ALT 25  ALKPHOS 51  BILITOT 1.2    Assessment / Plan:   Assessment: 1.  Intractable nausea and vomiting with epigastric pain: Assumed gastroparesis, awaiting gastric emptying study on 3/30  Plan: 1.  Patient is much much better today.  Would recommend that we go ahead and send her home to follow-up with 4/30 as an outpatient after scheduled gastric emptying study in a couple days. 2.  Please await any further recommendations from Kelli Hudson later today.  I did increase patient to a soft diet for lunch.  Thank you for your kind consultation.    LOS: 3 days   Kelli Hudson  05/14/2020, 1:07 PM

## 2020-05-14 NOTE — Progress Notes (Signed)
PROGRESS NOTE    Kelli ReichmannJazmyn Hudson  ZOX:[id number] DOB: 23-Sep-1990 DOA: 05/09/2020 PCP: Mliss SaxKremer, Kelli Alfred, MD  Brief Narrative: The patient is a 30 year old African-American female with a past medical history significant for but not limited to diabetes mellitus type 1 with gastroparesis, hypertension, history of H. pylori infection, history of hyperthyroidism, history of GERD, history of gastroparesis as well as other comorbidities including amputation of the left fifth toe who presented for the evaluation of nausea vomiting as well as abdominal pain.  She presented to the emergency room with complaint of diffuse abdominal pain and persistent nausea vomiting that began in the morning and she does not remember any inciting event.  She states that she has had too numerous to count episodes of nausea vomiting has not had any coffee ground emesis with the vomiting but continued to have diffuse severe abdominal pain that did not radiate into her chest or pelvis.  She denied any diarrhea but she stated that she did notice some blood tinged discoloration in her emesis after several episodes of vomiting.  She is not having any fevers and does have a history of this in the past for which she has been hospitalized.  She has been given the working diagnosis of diabetic gastroparesis however has not been formally tested for this and her gastric emptying study to be done next week.  She feels that this is similar to her previous episodes and she is recently evaluated by [facility] was prescribed a medication last week which she does not remember but states that it did not help very much.  In the ED she is found to have a normal WBC and a normal x-ray of her abdomen.  She was admitted to the hospitalist service and also noted to have acute urinary retention as she was holding onto her urine and had at least 1.1 L noted on her CT scan abdomen pelvis.  She was steadily improving and her diet was advanced however  this morning she worsened and became extremely nauseous and started vomiting and had significant abdominal pain similar to her admission presentation.  She is given antiemetics and her pain medication was renewed and GI was consulted for further evaluation recommendations and she was also started on scheduled metoclopramide IV 10 mg every 8h.  Gastroenterology started her on IV erythromycin and Levbid 0.375 mg p.o. every 12 as needed cramping abdominal pain. She continues to have intractable nausea vomiting and now continues abdominal pain so her pain medications have been renewed.  She failed to improve yesterday so she is kept on a full liquid diet but now is improving today.  Will defer further diet advancement to GI given that she has no Nausea/Vomiting or Abdominal Pain today and they have advanced her diet to Soft Diet.   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  -Pt is continued on basal insulin and currently on 13 units subcu nightly. Initiated on Moderate NovoLog sliding scale insulin AC for glycemic control. -Checked hemoglobin A1c and was 11.5 -She stake Tresiba 13 units qHS but not any Rapid-Acting Insulin; TOC has been Consulted by the Diabetes Patent attorneyducation Coordinator for [employer]  of Rapid Acting Insulin -Patient with known history of gastroparesis was given dose of Haldol and Reglan in the emergency room but continues to have symptoms. -To monitor CBGs per protocol and CBGs have been ranging from 109-165; initial blood sugars less than 200 -Patient has recently seen gastroenterology as an outpatient and states she was  given a medication but she does not member the name and it was not very helpful she states. -We will initiate metoclopramide IV 10 mg every 8 scheduled continue for now; gastroenterology has started the patient on erythromycin to 50 mg every 8 hours as scheduled and have also started the patient on 4 mg p.o./IV every 6 standing Zofran and because she continues to have intractable nausea vomiting we will add IV Compazine 10 mg every 6 as needed for refractory nausea vomiting -Because she worsened today Gastroenterology will be consulted for formal evaluation and appreciate further evaluation -Continues to have significant abdominal pain so we will continue with 0.5 mg of hydromorphone every 3 hours as needed for severe pain and limit the amount of narcotics that the patient gets if able  -GI recommend weaning off narcotics if possible and adding back IV Protonix and we have doubled the dose of IV Protonix given that she continues have some burning sensation in her chest -GI discussed about her right upper quadrant ultrasound and they do not feel the patient needed 1 but they ordered it to screen for gallstones and this was negative for any pathology   Intractable Nausea and Vomiting in the setting of Diabetic Gastroparesis, Improving  -C/w Antiemetics with po/IV Ondansetron q6hprn Nausea but GI scheduled this and I added IV Compazine today given her intractable nausea vomiting still. -IV fluid hydration with normal saline overnight and we reduced this to 100 mL/h and will continue with IV fluid hydration at this time -She was given metoclopramide for nausea and vomiting refractory to ondansetron and this has been renewed at 10 mg IV every 8 hours scheduled -Diet was cut back to clear liquid diet but advanced to FULL Liquid a few nights ago.  She remains on full yesterday and feels better today so gastroenterology has advance diet to a soft diet today -She is on IV PPI BID and will add IV Famotidine 20 mg  BID -GI consulted for further evaluation and recommendations  -abdominal pain is improved  Hypomagnesemia -Patient's Mag level was 1.6 and today is 1.8 -Replete with IV Mag Sulfate 2 grams again -Continue to Monitor and Replete as Necessary -Repeat Mag Level in the AM   Essential Hypertension -Continue home medications Atenolol 25 mg po BID and Lisinopril 5 mg po Daily -Continue to Monitor BP per protocol -Last BP was 125/86 this AM   AKI -In the setting of Nausea and Vomiting -Urinalysis was relatively unremarkable and showed a clear appearance with negative leukocytes, negative nitrates, rare bacteria, 0-5 RBCs per high-power field, 0-5 squamous epithelial cells, and 0-5 WBCs but urine culture is growing out greater than 100,000 colony-forming units of lactobacillus Speices -Will not Treat  -Patient's BUN/Cr went from 27/0.95 -> 27/1.10 -> 14/0.59 -> 12/0.70 -> 10/0.57 -> 11/0.88 -IVF Hydration as below -Avoid Nephrotoxic Medications, Contrast Dyes, Hypotension and Renally adjust Medications -Repeat CMP in the AM   Hyperbilirubinemia -Patient's T Bili went from 1.1 -> 1.6 and likely reactive from Nausea and Vomiting -Continue to Monitor and Trend -Repeat CMP in the AM  Normocytic Anemia -Patient's Hgb/Hct went from 12.2/37.0 -> 10.3/31.7 -> 10.4/31.6 -> 12.2/36.6 ->  12.8/38.6 -> 10.5/31.8 -Likely Dilutional Drop -Checked Anemia Panel and it showed an iron level of 99, U IBC of 359, TIBC of 458, saturation ratio of 22%, ferritin level 27, folate level 21.0, vitamin B12 547 -Continue to Monitor for S/Sx of Bleeding; No overt bleeding noted but she did have some blood in vomiting previously -GI has been consulted as above for her intractable nausea vomiting and appreciate their assistance  -Repeat CBC in the AM   Hyponatremia -Mild as Sodium went from 136 -> 132 -> 131 -> 136 -IVF as above -Continue to Monitor and Trend -Repeat CBC in the AM  Abdominal Pain, initially  worsened and now improving -In the setting of Suspected Gastroparesis  -Patient was given Hydromorphone 1 mg x1 overnight and a dose of IV Morphine 4 mg; She had 1mg  of IV Hyrdromorphone q3hprn Severe Pain -Pt has hx of pylori infection and she states she has had ulcers in the past.  - Will obtain CT of the abdomen to make sure there is not any acute pathology; CT Abd/Pelvis showed "No acute abdominal/pelvic findings, mass lesions or adenopathy. Marked distention of the bladder almost up to the umbilicus. Bladder volume is estimated at 1.1 L." - X-ray series done in the emergency room was negative and showed "Negative abdominal radiographs.  No acute cardiopulmonary disease." -Because she had Extreme abdominal Pain will consult Gastroenterology for further evaluation and recommendations -Continue to Monitor for Retention   Tachycardia, improved -Patient with Sinus Tachycardia secondary to pain and probable volume loss with persistent nausea and vomiting.  She continues to have nausea vomiting today -IV fluid hydration provided and she is getting 150 mL of normal saline per hour but will change this to 100 and mils per hour for now; Received a bolus of NS 1,000 mL in the ED -Continue to Monitor on Telemetry; Last Documented HR was 90  Acute Urinary Retention improved -CT scan on admission showed that the bladder volume was estimated to be 1.1 L -Bladder scan done yesterday morning showed the patient was retaining 450 mL  -INO cath done and she may need a Foley catheter -Check a urinalysis and urine culture; urinalysis showed a clear appearance of her urine with greater than 500 glucose, negative hemoglobin, 20 ketones, rare bacteria, 0-5 squamous epithelial cells, 0-5 RBCs per high-power field and 0-5 WBCs with urine culture pending -We will check bladder scans every 6h -Continue monitor PVRs carefully   Leukocytosis, improved but slightly trending back up again -In the setting of nausea  vomiting -Continue monitor carefully her she was also holding onto her urine and retaining -CT of the abdomen and pelvis showed "No acute abdominal/pelvic findings, mass lesions or adenopathy. Marked distention of the bladder almost up to the umbilicus. Bladder volume is estimated at 1.1 L." -Continue to monitor for signs and symptoms of infection and currently holding off on antibiotics for now -WBC went from 9.8 -> 13.4 -> 8.2 -> 10.1 -> 9.4 -> 9.2 -Repeat CBC in AM   DVT prophylaxis: SCDs Code Status: FULL CODE  Family Communication: No family present at bedside but spoke to the patient's mother yesterday over the phone  Disposition Plan: Pending further clinical improvement and tolerance of Diet   Status is: Inpatient  Remains inpatient appropriate because:Unsafe d/c plan, IV treatments appropriate due to intensity of illness or inability to take PO and Inpatient level of care appropriate due to severity of illness   Dispo: The patient is from: Home  Anticipated d/c is to: Home              Patient currently is not medically stable to d/c.   Difficult to place patient No  Consultants:   Gastroenterology   Procedures: None  Antimicrobials:  Anti-infectives (From admission, onward)   Start     Dose/Rate Route Frequency Ordered Stop   05/11/20 1300  erythromycin 250 mg in sodium chloride 0.9 % 100 mL IVPB        250 mg 100 mL/hr over 60 Minutes Intravenous Every 8 hours 05/11/20 1201 05/15/20 1359        Subjective: Seen and examined at bedside and she states that she is feeling much better today.  She denies any chest pain first with, nausea, vomiting and states her abdominal pain is improved and resolved.  Has not vomited today at all.  Denies any other concerns or complaints at this time and has been tolerating her food better.   Objective: Vitals:   05/13/20 0419 05/13/20 1233 05/13/20 2006 05/14/20 0604  BP: (!) 166/104 133/90 112/75 (!) 134/91  Pulse:  99 (!) 101 95 90  Resp: 15 18 16 16   Temp: 99.1 F (37.3 C) 99.2 F (37.3 C) 98.6 F (37 C) 98.6 F (37 C)  TempSrc: Oral Oral Oral Oral  SpO2: 97% 99% 100% 98%  Weight:      Height:        Intake/Output Summary (Last 24 hours) at 05/14/2020 1201 Last data filed at 05/13/2020 2300 Gross per 24 hour  Intake 1670.18 ml  Output --  Net 1670.18 ml   Filed Weights   05/09/20 1722  Weight: 72.6 kg   Examination: Physical Exam:  Constitutional: WN/WD AAF in NAD appears calm and Abdominal Pain has resolved Eyes: Lids and conjunctivae normal, sclerae anicteric  ENMT: External Ears, Nose appear normal. Grossly normal hearing.  Neck: Appears normal, supple, no cervical masses, normal ROM, no appreciable thyromegaly; no JVD Respiratory: Mildly diminished to auscultation bilaterally, no wheezing, rales, rhonchi or crackles. Normal respiratory effort and patient is not tachypenic. No accessory muscle use.  Cardiovascular: RRR, no murmurs / rubs / gallops. S1 and S2 auscultated. No extremity edema Abdomen: Soft, non-tender, mildly distended. Bowel sounds positive.  GU: Deferred. Musculoskeletal: No clubbing / cyanosis of digits/nails. No joint deformity upper and lower extremities.   Skin: No rashes, lesions, ulcers on limited skin evaluation. No induration; Warm and dry.  Neurologic: CN 2-12 grossly intact with no focal deficits. Romberg sign cerebellar reflexes not assessed.  Psychiatric: Normal judgment and insight. Alert and oriented x 3. Normal mood and appropriate affect.   Data Reviewed: I have personally reviewed following labs and imaging studies  CBC: Recent Labs  Lab 05/10/20 0900 05/11/20 0525 05/12/20 1024 05/13/20 0518 05/14/20 0425  WBC 13.4* 8.2 10.1 9.4 9.2  NEUTROABS 8.6* 3.4 7.8* 7.1 3.5  HGB 10.3* 10.4* 12.2 12.8 10.5*  HCT 31.7* 31.6* 36.6 38.6 31.8*  MCV 92.2 92.4 87.6 89.6 90.6  PLT 320 318 362 384 318   Basic Metabolic Panel: Recent Labs  Lab  05/10/20 0457 05/11/20 0525 05/12/20 1024 05/13/20 0518 05/14/20 0425  NA 135 136 132* 131* 136  K 4.3 3.8 4.3 5.0 4.2  CL 101 105 99 97* 106  CO2 23 23 23  20* 25  GLUCOSE 260* 189* 190* 227* 111*  BUN 27* 14 12 10 11   CREATININE 1.10* 0.59 0.70 0.57 0.88  CALCIUM 8.2* 8.6* 9.3 9.4 8.9  MG  --  1.8 1.7 1.6* 1.8  PHOS  --  2.8 2.5 3.0 3.3   GFR: Estimated Creatinine Clearance: 96 mL/min (by C-G formula based on SCr of 0.88 mg/dL). Liver Function Tests: Recent Labs  Lab 05/09/20 1728 05/11/20 0525 05/12/20 1024 05/13/20 0518 05/14/20 0425  AST 34 26  ALT 36 ALKPHOS 81 54 63 63 51  BILITOT 0.8 0.9 1.1 1.6* 1.2  PROT 7.8 6.1* 7.0 7.4 5.6*  ALBUMIN 3.9 3.0* 3.3* 3.4* 2.7*   Recent Labs  Lab 05/09/20 1728  LIPASE 25   No results for input(s): AMMONIA in the last 168 hours. Coagulation Profile: No results for input(s): INR, PROTIME in the last 168 hours. Cardiac Enzymes: No results for input(s): CKTOTAL, CKMB, CKMBINDEX, TROPONINI in the last 168 hours. BNP (last 3 results) No results for input(s): PROBNP in the last 8760 hours. HbA1C: No results for input(s): HGBA1C in the last 72 hours. CBG: Recent Labs  Lab 05/13/20 1634 05/13/20 2008 05/13/20 2353 05/14/20 0606 05/14/20 0744  GLUCAP 130* 138* 109* 112* 142*   Lipid Profile: No results for input(s): CHOL, HDL, LDLCALC, TRIG, CHOLHDL, LDLDIRECT in the last 72 hours. Thyroid Function Tests: No results for input(s): TSH, T4TOTAL, FREET4, T3FREE, THYROIDAB in the last 72 hours. Anemia Panel: No results for input(s): VITAMINB12, FOLATE, FERRITIN, TIBC, IRON, RETICCTPCT in the last 72 hours. Sepsis Labs: No results for input(s): PROCALCITON, LATICACIDVEN in the last 168 hours.  Recent Results (from the past 240 hour(s))  SARS CORONAVIRUS 2 (TAT 6-24 HRS) Nasopharyngeal Nasopharyngeal Swab     Status: None   Collection Time: 05/09/20  8:53 PM   Specimen: Nasopharyngeal Swab  Result  Value Ref Range Status   SARS Coronavirus 2 NEGATIVE NEGATIVE Final    Comment: (NOTE) SARS-CoV-2 target nucleic acids are NOT DETECTED.  The SARS-CoV-2 RNA is generally detectable in upper and lower respiratory specimens during the acute phase of infection. Negative results do not preclude SARS-CoV-2 infection, do not rule out co-infections with other pathogens, and should not be used as the sole basis for treatment or other patient management decisions. Negative results must be combined with clinical observations, patient history, and epidemiological information. The expected result is Negative.  Fact Sheet for Patients: HairSlick.no  Fact Sheet for Healthcare Providers: quierodirigir.com  This test is not yet approved or cleared by the [place] FDA and  has been authorized for detection and/or diagnosis of SARS-CoV-2 by FDA under an Emergency Use Authorization (EUA). This EUA will remain  in effect (meaning this test can be used) for the duration of the COVID-19 declaration under Se ction 564(b)(1) of the Act, 21 U.S.C. section 360bbb-3(b)(1), unless the authorization is terminated or revoked sooner.  Performed at [facility], [address], [place], [place] [address]   Culture, Urine     Status: Abnormal   Collection Time: 05/10/20 11:53 AM   Specimen: Urine, Catheterized  Result Value Ref Range Status   Specimen Description URINE, CATHETERIZED  Final   Special Requests NONE  Final   Culture (A)  Final    >=100,000 COLONIES/mL LACTOBACILLUS SPECIES Standardized susceptibility testing for this organism is not available.    Report Status 05/11/2020 FINAL  Final    RN Pressure Injury Documentation:     Estimated body mass index is 24.33 kg/m as calculated from the following:   Height as of this encounter:  (1.727 m).   Weight as of this encounter: 72.6  kg.  Malnutrition Type:   Malnutrition  Characteristics:   Nutrition Interventions:   Radiology Studies: No results found. Scheduled Meds: . atenolol  25 mg Oral BID  . insulin aspart  0-15 Units Subcutaneous TID WC  . insulin glargine  13 Units Subcutaneous QHS  . lisinopril  5 mg Oral Daily  . metoCLOPramide (REGLAN) injection  10 mg Intravenous Q8H  . ondansetron (ZOFRAN) IV  4 mg Intravenous Q6H   Or  . ondansetron  4 mg Oral Q6H  . pantoprazole (PROTONIX) IV  40 mg Intravenous Q12H   Continuous Infusions: . sodium chloride 100 mL/hr at 05/14/20 0106  . erythromycin 250 mg (05/14/20 0520)  . famotidine (PEPCID) IV 20 mg (05/14/20 0927)    LOS: 3 days   Merlene Laughter, DO [facility] PAGER is on AMION  If 7PM-7AM, please contact night-coverage [email or web address]

## 2020-05-14 NOTE — Progress Notes (Signed)
Initial Nutrition Assessment  DOCUMENTATION CODES:   Not applicable  INTERVENTION:  - will order Glucerna Shake BID, each supplement provides 220 kcal and 10 grams of protein - will order 30 ml Prosource Plus once/day, each supplement provides 100 kcal and 15 grams protein.  - placed handout in Discharge Instructions. - ordered referral for [facility] for outpatient DM diet education.   NUTRITION DIAGNOSIS:   Inadequate oral intake related to acute illness,nausea,vomiting as evidenced by per patient/family report.  GOAL:   Patient will meet greater than or equal to 90% of their needs  MONITOR:   PO intake,Supplement acceptance,Labs,Weight trends  REASON FOR ASSESSMENT:   Consult Assessment of nutrition requirement/status,Diet education  ASSESSMENT:   30 year old female with medical history of type 1 DM, gastroparesis, HTN, hx of H. pylori infection, hyperthyroidism, GERD, and amputation of L fifth toe. She presented to the ED due to N/V and abdominal pain which began the day of presentation.  Unable to see patient at time of attempted visit. Noted discharge order is now in. GI saw patient earlier this afternoon and recommended outpatient gastric emptying study.   Diet advanced from CLD to FLD on 3/26 and to Soft this afternoon. The only documented intakes are 5% of breakfast and 0% of dinner on 3/25; 0% of breakfast and 5% of lunch yesterday; 75% of breakfast today.   Weight on 3/23 was 160 lb, which appears to be a stated weight. Weight appears to have been stable since 02/24/20. Weight on 02/06/20 was 166 lb which indicates 6 lb weight loss (3.6% body weight) in the past 3 months; not significant for time frame.    Labs reviewed; HgbA1c: 11.5%, CBGs: 112, 142, 165 mg/dl. Medications reviewed; 20 mg IV pepcid BID, sliding scale novolog, 13 units lantus/day, 2 g IV Mg sulfate x1 run 3/27, 10 mg IV reglan TID, 40 mg IV protonix BID.  IVF; NS @ 100 ml/hr.     NUTRITION - FOCUSED  PHYSICAL EXAM:  unable to complete at this time.  Diet Order:   Diet Order            DIET SOFT Room service appropriate? Yes; Fluid consistency: Thin  Diet effective now           Diet - low sodium heart healthy           Diet Carb Modified                 EDUCATION NEEDS:   Education needs have been addressed  Skin:  Skin Assessment: Reviewed RN Assessment  Last BM:  3/27  Height:   Ht Readings from Last 1 Encounters:  05/09/20 5\' 8"  (1.727 m)    Weight:   Wt Readings from Last 1 Encounters:  05/09/20 72.6 kg     Estimated Nutritional Needs:  Kcal:  1700-1900 kcal Protein:  80-95 grams Fluid:  >/= 2.2 L/day      05/11/20, MS, RD, LDN, CNSC Inpatient Clinical Dietitian RD pager # available in AMION  After hours/weekend pager # available in [facility]

## 2020-05-14 NOTE — Plan of Care (Signed)
  Problem: Education: Goal: Knowledge of General Education information will improve Description Including pain rating scale, medication(s)/side effects and non-pharmacologic comfort measures Outcome: Progressing   Problem: Nutrition: Goal: Adequate nutrition will be maintained Outcome: Progressing   Problem: Pain Managment: Goal: General experience of comfort will improve Outcome: Progressing   

## 2020-05-14 NOTE — Discharge Instructions (Signed)
Gastroparesis  Gastroparesis is a condition in which food takes longer than normal to empty from the stomach. This condition is also known as delayed gastric emptying. It is usually a long-term (chronic) condition. There is no cure, but there are treatments and things that you can do at home to help relieve symptoms. Treating the underlying condition that causes gastroparesis can also help relieve symptoms. What are the causes? In many cases, the cause of this condition is not known. Possible causes include:  A hormone (endocrine) disorder, such as hypothyroidism or diabetes.  A nervous system disease, such as Parkinson's disease or multiple sclerosis.  Cancer, infection, or surgery that affects the stomach or vagus nerve. The vagus nerve runs from your chest, through your neck, and to the lower part of your brain.  A connective tissue disorder, such as scleroderma.  Certain medicines. What increases the risk? You are more likely to develop this condition if:  You have certain disorders or diseases. These may include: ? An endocrine disorder. ? An eating disorder. ? Amyloidosis. ? Scleroderma. ? Parkinson's disease. ? Multiple sclerosis. ? Cancer or infection of the stomach or the vagus nerve.  You have had surgery on your stomach or vagus nerve.  You take certain medicines.  You are female. What are the signs or symptoms? Symptoms of this condition include:  Feeling full after eating very little or a loss of appetite.  Nausea, vomiting, or heartburn.  Bloating of your abdomen.  Inconsistent blood sugar (glucose) levels on blood tests.  Unexplained weight loss.  Acid from the stomach coming up into the esophagus (gastroesophageal reflux).  Sudden tightening (spasm) of the stomach, which can be painful. Symptoms may come and go. Some people may not notice any symptoms. How is this diagnosed? This condition is diagnosed with tests, such as:  Tests that check how  long it takes food to move through the stomach and intestines. These tests include: ? Upper gastrointestinal (GI) series. For this test, you drink a liquid that shows up well on X-rays, and then X-rays are taken of your intestines. ? Gastric emptying scintigraphy. For this test, you eat food that contains a small amount of radioactive material, and then scans are taken. ? Wireless capsule GI monitoring system. For this test, you swallow a pill (capsule) that records information about how foods and fluid move through your stomach.  Gastric manometry. For this test, a tube is passed down your throat and into your stomach to measure electrical and muscular activity.  Endoscopy. For this test, a long, thin tube with a camera and light on the end is passed down your throat and into your stomach to check for problems in your stomach lining.  Ultrasound. This test uses sound waves to create images of the inside of your body. This can help rule out gallbladder disease or pancreatitis as a cause of your symptoms. How is this treated? There is no cure for this condition, but treatment and home care may relieve symptoms. Treatment may include:  Treating the underlying cause.  Managing your symptoms by making changes to your diet and exercise habits.  Taking medicines to control nausea and vomiting and to stimulate stomach muscles.  Getting food through a feeding tube in the hospital. This may be done in severe cases.  Having surgery to insert a device called a gastric electrical stimulator into your body. This device helps improve stomach emptying and control nausea and vomiting. Follow these instructions at home:  Take over-the-counter and   prescription medicines only as told by your health care provider.  Follow instructions from your health care provider about eating or drinking restrictions. Your health care provider may recommend that you: ? Eat smaller meals more often. ? Eat low-fat  foods. ? Eat low-fiber forms of high-fiber foods. For example, eat cooked vegetables instead of raw vegetables. ? Have only liquid foods instead of solid foods. Liquid foods are easier to digest.  Drink enough fluid to keep your urine pale yellow.  Exercise as often as told by your health care provider.  Keep all follow-up visits. This is important. Contact a health care provider if you:  Notice that your symptoms do not improve with treatment.  Have new symptoms. Get help right away if you:  Have severe pain in your abdomen that does not improve with treatment.  Have nausea that is severe or does not go away.  Vomit every time you drink fluids. Summary  Gastroparesis is a long-term (chronic) condition in which food takes longer than normal to empty from the stomach.  Symptoms include nausea, vomiting, heartburn, bloating of your abdomen, and loss of appetite.  Eating smaller portions, low-fat foods, and low-fiber forms of high-fiber foods may help you manage your symptoms.  Get help right away if you have severe pain in your abdomen. This information is not intended to replace advice given to you by your health care provider. Make sure you discuss any questions you have with your health care provider. Document Revised: 06/13/2019 Document Reviewed: 06/13/2019 Elsevier Patient Education  2021 [employer]   Nausea and Vomiting, Adult Nausea is the feeling that you have an upset stomach or that you are about to vomit. Vomiting is when stomach contents are thrown up and out of the mouth as a result of nausea. Vomiting can make you feel weak and cause you to become dehydrated. Dehydration can make you feel tired and thirsty, cause you to have a dry mouth, and decrease how often you urinate. Older adults and people with other diseases or a weak disease-fighting system (immune system) are at higher risk for dehydration. It is important to treat your nausea and vomiting as told by  your health care provider. Follow these instructions at home: Watch your symptoms for any changes. Tell your health care provider about them. Follow these instructions to care for yourself at home. Eating and drinking  Take an oral rehydration solution (ORS). This is a drink that is sold at pharmacies and retail stores.  Drink clear fluids slowly and in small amounts as you are able. Clear fluids include water, ice chips, low-calorie sports drinks, and fruit juice that has water added (diluted fruit juice).  Eat bland, easy-to-digest foods in small amounts as you are able. These foods include bananas, applesauce, rice, lean meats, toast, and crackers.  Avoid fluids that contain a lot of sugar or caffeine, such as energy drinks, sports drinks, and soda.  Avoid alcohol.  Avoid spicy or fatty foods.      General instructions  Take over-the-counter and prescription medicines only as told by your health care provider.  Drink enough fluid to keep your urine pale yellow.  Wash your hands often using soap and water. If soap and water are not available, use hand sanitizer.  Make sure that all people in your household wash their hands well and often.  Rest at home while you recover.  Watch your condition for any changes.  Breathe slowly and deeply when you feel nauseated.  Keep  all follow-up visits as told by your health care provider. This is important. Contact a health care provider if:  Your symptoms get worse.  You have new symptoms.  You have a fever.  You cannot drink fluids without vomiting.  Your nausea does not go away after 2 days.  You feel light-headed or dizzy.  You have a headache.  You have muscle cramps.  You have a rash.  You have pain while urinating. Get help right away if:  You have pain in your chest, neck, arm, or jaw.  You feel extremely weak or you faint.  You have persistent vomiting.  You have vomit that is bright red or looks like black  coffee grounds.  You have bloody or black stools or stools that look like tar.  You have a severe headache, a stiff neck, or both.  You have severe pain, cramping, or bloating in your abdomen.  You have difficulty breathing, or you are breathing very quickly.  Your heart is beating very quickly.  Your skin feels cold and clammy.  You feel confused.  You have signs of dehydration, such as: ? Dark urine, very little urine, or no urine. ? Cracked lips. ? Dry mouth. ? Sunken eyes. ? Sleepiness. ? Weakness. These symptoms may represent a serious problem that is an emergency. Do not wait to see if the symptoms will go away. Get medical help right away. Call your local emergency services (911 in the [place] Do not drive yourself to the hospital. Summary  Nausea is the feeling that you have an upset stomach or that you are about to vomit. As nausea gets worse, it can lead to vomiting. Vomiting can make you feel weak and cause you to become dehydrated.  Follow instructions from your health care provider about eating and drinking to prevent dehydration.  Take over-the-counter and prescription medicines only as told by your health care provider.  Contact your health care provider if your symptoms get worse, or you have new symptoms.  Keep all follow-up visits as told by your health care provider. This is important. This information is not intended to replace advice given to you by your health care provider. Make sure you discuss any questions you have with your health care provider. Document Revised: 05/28/2018 Document Reviewed: 07/14/2017 Elsevier Patient Education  2021 [employer] Inc.   Abdominal Pain, Adult Pain in the abdomen (abdominal pain) can be caused by many things. Often, abdominal pain is not serious and it gets better with no treatment or by being treated at home. However, sometimes abdominal pain is serious. Your health care provider will ask questions about your medical  history and do a physical exam to try to determine the cause of your abdominal pain. Follow these instructions at home: Medicines  Take over-the-counter and prescription medicines only as told by your health care provider.  Do not take a laxative unless told by your health care provider. General instructions  Watch your condition for any changes.  Drink enough fluid to keep your urine pale yellow.  Keep all follow-up visits as told by your health care provider. This is important.   Contact a health care provider if:  Your abdominal pain changes or gets worse.  You are not hungry or you lose weight without trying.  You are constipated or have diarrhea for more than 2-3 days.  You have pain when you urinate or have a bowel movement.  Your abdominal pain wakes you up at night.  Your pain  gets worse with meals, after eating, or with certain foods.  You are vomiting and cannot keep anything down.  You have a fever.  You have blood in your urine. Get help right away if:  Your pain does not go away as soon as your health care provider told you to expect.  You cannot stop vomiting.  Your pain is only in areas of the abdomen, such as the right side or the left lower portion of the abdomen. Pain on the right side could be caused by appendicitis.  You have bloody or black stools, or stools that look like tar.  You have severe pain, cramping, or bloating in your abdomen.  You have signs of dehydration, such as: ? Dark urine, very little urine, or no urine. ? Cracked lips. ? Dry mouth. ? Sunken eyes. ? Sleepiness. ? Weakness.  You have trouble breathing or chest pain. Summary  Often, abdominal pain is not serious and it gets better with no treatment or by being treated at home. However, sometimes abdominal pain is serious.  Watch your condition for any changes.  Take over-the-counter and prescription medicines only as told by your health care provider.  Contact a  health care provider if your abdominal pain changes or gets worse.  Get help right away if you have severe pain, cramping, or bloating in your abdomen. This information is not intended to replace advice given to you by your health care provider. Make sure you discuss any questions you have with your health care provider. Document Revised: 03/25/2019 Document Reviewed: 06/14/2018 Elsevier Patient Education  2021 [employer] Inc.   Carbohydrate Counting For People With Diabetes  Foods with carbohydrates make your blood glucose level go up. Learning how to count carbohydrates can help you control your blood glucose levels. First, identify the foods you eat that contain carbohydrates. Then, using the Foods with Carbohydrates chart, determine about how much carbohydrates are in your meals and snacks. Make sure you are eating foods with fiber, protein, and healthy fat along with your carbohydrate foods. Foods with Carbohydrates The following table shows carbohydrate foods that have about 15 grams of carbohydrate each. Using measuring cups, spoons, or a food scale when you first begin learning about carbohydrate counting can help you learn about the portion sizes you typically eat. The following foods have 15 grams carbohydrate each:  Grains . 1 slice bread (1 ounce)  . 1 small tortilla (6-inch size)  .  large bagel (1 ounce)  . 1/3 cup pasta or rice (cooked)  .  hamburger or hot dog bun ( ounce)  .  cup cooked cereal  .  to  cup ready-to-eat cereal  . 2 taco shells (5-inch size) Fruit . 1 small fresh fruit ( to 1 cup)  .  medium banana  . 17 small grapes (3 ounces)  . 1 cup melon or berries  .  cup canned or frozen fruit  . 2 tablespoons dried fruit (blueberries, cherries, cranberries, raisins)  .  cup unsweetened fruit juice  Starchy Vegetables .  cup cooked beans, peas, corn, potatoes/sweet potatoes  .  large baked potato (3 ounces)  . 1 cup acorn or butternut squash  Snack  Foods . 3 to 6 crackers  . 8 potato chips or 13 tortilla chips ( ounce to 1 ounce)  . 3 cups popped popcorn  Dairy . 3/4 cup (6 ounces) nonfat plain yogurt, or yogurt with sugar-free sweetener  . 1 cup milk  . 1 cup plain  rice, soy, coconut or flavored almond milk Sweets and Desserts .  cup ice cream or frozen yogurt  . 1 tablespoon jam, jelly, pancake syrup, table sugar, or honey  . 2 tablespoons light pancake syrup  . 1 inch square of frosted cake or 2 inch square of unfrosted cake  . 2 small cookies (2/3 ounce each) or  large cookie  Sometimes you'll have to estimate carbohydrate amounts if you don't know the exact recipe. One cup of mixed foods like soups can have 1 to 2 carbohydrate servings, while some casseroles might have 2 or more servings of carbohydrate. Foods that have less than 20 calories in each serving can be counted as "free" foods. Count 1 cup raw vegetables, or  cup cooked non-starchy vegetables as "free" foods. If you eat 3 or more servings at one meal, then count them as 1 carbohydrate serving.  Foods without Carbohydrates  Not all foods contain carbohydrates. Meat, some dairy, fats, non-starchy vegetables, and many beverages don't contain carbohydrate. So when you count carbohydrates, you can generally exclude chicken, pork, beef, fish, seafood, eggs, tofu, cheese, butter, sour cream, avocado, nuts, seeds, olives, mayonnaise, water, black coffee, unsweetened tea, and zero-calorie drinks. Vegetables with no or low carbohydrate include green beans, cauliflower, tomatoes, and onions. How much carbohydrate should I eat at each meal?  Carbohydrate counting can help you plan your meals and manage your weight. Following are some starting points for carbohydrate intake at each meal. Work with your registered dietitian nutritionist to find the best range that works for your blood glucose and weight.   To Lose Weight To Maintain Weight  Women 2 - 3 carb servings 3 - 4 carb  servings  Men 3 - 4 carb servings 4 - 5 carb servings  Checking your blood glucose after meals will help you know if you need to adjust the timing, type, or number of carbohydrate servings in your meal plan. Achieve and keep a healthy body weight by balancing your food intake and physical activity.  Tips How should I plan my meals?  Plan for half the food on your plate to include non-starchy vegetables, like salad greens, broccoli, or carrots. Try to eat 3 to 5 servings of non-starchy vegetables every day. Have a protein food at each meal. Protein foods include chicken, fish, meat, eggs, or beans (note that beans contain carbohydrate). These two food groups (non-starchy vegetables and proteins) are low in carbohydrate. If you fill up your plate with these foods, you will eat less carbohydrate but still fill up your stomach. Try to limit your carbohydrate portion to  of the plate.  What fats are healthiest to eat?  Diabetes increases risk for heart disease. To help protect your heart, eat more healthy fats, such as olive oil, nuts, and avocado. Eat less saturated fats like butter, cream, and high-fat meats, like bacon and sausage. Avoid trans fats, which are in all foods that list "partially hydrogenated oil" as an ingredient. What should I drink?  Choose drinks that are not sweetened with sugar. The healthiest choices are water, carbonated or seltzer waters, and tea and coffee without added sugars.  Sweet drinks will make your blood glucose go up very quickly. One serving of soda or energy drink is  cup. It is best to drink these beverages only if your blood glucose is low.  Artificially sweetened, or diet drinks, typically do not increase your blood glucose if they have zero calories in them. Read labels of beverages, as  some diet drinks do have carbohydrate and will raise your blood glucose. Label Reading Tips Read Nutrition Facts labels to find out how many grams of carbohydrate are in a food you  want to eat. Don't forget: sometimes serving sizes on the label aren't the same as how much food you are going to eat, so you may need to calculate how much carbohydrate is in the food you are serving yourself.   Carbohydrate Counting for People with Diabetes Sample 1-Day Menu  Breakfast  cup yogurt, low fat, low sugar (1 carbohydrate serving)   cup cereal, ready-to-eat, unsweetened (1 carbohydrate serving)  1 cup strawberries (1 carbohydrate serving)   cup almonds ( carbohydrate serving)  Lunch 1, 5 ounce can chunk light tuna  2 ounces cheese, low fat cheddar  6 whole wheat crackers (1 carbohydrate serving)  1 small apple (1 carbohydrate servings)   cup carrots ( carbohydrate serving)   cup snap peas  1 cup 1% milk (1 carbohydrate serving)   Evening Meal Stir fry made with: 3 ounces chicken  1 cup brown rice (3 carbohydrate servings)   cup broccoli ( carbohydrate serving)   cup green beans   cup onions  1 tablespoon olive oil  2 tablespoons teriyaki sauce ( carbohydrate serving)  Evening Snack 1 extra small banana (1 carbohydrate serving)  1 tablespoon peanut butter   Carbohydrate Counting for People with Diabetes Vegan Sample 1-Day Menu  Breakfast 1 cup cooked oatmeal (2 carbohydrate servings)   cup blueberries (1 carbohydrate serving)  2 tablespoons flaxseeds  1 cup soymilk fortified with calcium and vitamin D  1 cup coffee  Lunch 2 slices whole wheat bread (2 carbohydrate servings)   cup baked tofu   cup lettuce  2 slices tomato  2 slices avocado   cup baby carrots ( carbohydrate serving)  1 orange (1 carbohydrate serving)  1 cup soymilk fortified with calcium and vitamin D   Evening Meal Burrito made with: 1 6-inch corn tortilla (1 carbohydrate serving)  1 cup refried vegetarian beans (2 carbohydrate servings)   cup chopped tomatoes   cup lettuce   cup salsa  1/3 cup brown rice (1 carbohydrate serving)  1 tablespoon olive oil for rice   cup  zucchini   Evening Snack 6 small whole grain crackers (1 carbohydrate serving)  2 apricots ( carbohydrate serving)   cup unsalted peanuts ( carbohydrate serving)    Carbohydrate Counting for People with Diabetes Vegetarian (Lacto-Ovo) Sample 1-Day Menu  Breakfast 1 cup cooked oatmeal (2 carbohydrate servings)   cup blueberries (1 carbohydrate serving)  2 tablespoons flaxseeds  1 egg  1 cup 1% milk (1 carbohydrate serving)  1 cup coffee  Lunch 2 slices whole wheat bread (2 carbohydrate servings)  2 ounces low-fat cheese   cup lettuce  2 slices tomato  2 slices avocado   cup baby carrots ( carbohydrate serving)  1 orange (1 carbohydrate serving)  1 cup unsweetened tea  Evening Meal Burrito made with: 1 6-inch corn tortilla (1 carbohydrate serving)   cup refried vegetarian beans (1 carbohydrate serving)   cup tomatoes   cup lettuce   cup salsa  1/3 cup brown rice (1 carbohydrate serving)  1 tablespoon olive oil for rice   cup zucchini  1 cup 1% milk (1 carbohydrate serving)  Evening Snack 6 small whole grain crackers (1 carbohydrate serving)  2 apricots ( carbohydrate serving)   cup unsalted peanuts ( carbohydrate serving)    Copyright 2020  Academy  [employer]. All rights reserved.  Using Nutrition Labels: Carbohydrate  . Serving Size  . Look at the serving size. All the information on the label is based on this portion. Jolyne Loa Per Container  . The number of servings contained in the package. . Guidelines for Carbohydrate  . Look at the total grams of carbohydrate in the serving size.  . 1 carbohydrate choice = 15 grams of carbohydrate. Range of Carbohydrate Grams Per Choice  Carbohydrate Grams/Choice Carbohydrate Choices  6-10   11-20 1  21-25 1  26-35 2  36-40 2  41-50 3  51-55 3  56-65 4  66-70 4  71-80 5    Copyright 2020  Academy of [employer]. All rights reserved.

## 2020-05-15 ENCOUNTER — Telehealth: Payer: Self-pay

## 2020-05-15 ENCOUNTER — Telehealth: Payer: Self-pay | Admitting: Family Medicine

## 2020-05-15 NOTE — Telephone Encounter (Signed)
Dr. Doreene Burke & Dr. Veto Kemps - are you ok with TOC to Dr. Veto Kemps?

## 2020-05-15 NOTE — Telephone Encounter (Signed)
-----   Message from Roanna Raider, LPN sent at [id number]/4503  2:26 PM EDT ----- Esau Grew, I just completed a TCM call for this patient & attempted to make a hosp f/u appt with Dr. Doreene Burke but the patient is requesting to switch to Dr. Veto Kemps as her PCP. Is that possible for her?  Thanks, Johnny Bridge

## 2020-05-15 NOTE — Telephone Encounter (Signed)
Transition Care Management Follow-up Telephone Call  Date of discharge and from where: 05/14/2020-[place]  How have you been since you were released from the hospital? Doing alright  Any questions or concerns? No  Items Reviewed:  Did the pt receive and understand the discharge instructions provided? Yes   Medications obtained and verified? Yes   Other? Yes   Any new allergies since your discharge? No   Dietary orders reviewed? Yes  Do you have support at home? Yes   Home Care and Equipment/Supplies: Were home health services ordered? no If so, what is the name of the agency? n/a  Has the agency set up a time to come to the patient's home? not applicable Were any new equipment or medical supplies ordered?  No What is the name of the medical supply agency? n/a Were you able to get the supplies/equipment? not applicable Do you have any questions related to the use of the equipment or supplies? n/a  Functional Questionnaire: (I = Independent and D = Dependent) ADLs: I  Bathing/Dressing- I  Meal Prep- I  Eating- I  Maintaining continence- I  Transferring/Ambulation- I  Managing Meds- I  Follow up appointments reviewed:   PCP Hospital f/u appt confirmed? No  Patient is requesting to switch PCPs. Awaiting confirmation to switch & then appt will be made.  Specialist Hospital f/u appt confirmed? No  Patient to schedule  Are transportation arrangements needed? No   If their condition worsens, is the pt aware to call PCP or go to the Emergency Dept.? Yes  Was the patient provided with contact information for the PCP's office or ED? Yes  Was to pt encouraged to call back with questions or concerns? No

## 2020-05-16 ENCOUNTER — Ambulatory Visit (HOSPITAL_COMMUNITY)
Admission: RE | Admit: 2020-05-16 | Discharge: 2020-05-16 | Disposition: A | Discharge status: Home / Self Care | Payer: 59 | Source: Ambulatory Visit | Attending: Physician Assistant | Admitting: Physician Assistant

## 2020-05-16 ENCOUNTER — Other Ambulatory Visit: Payer: Self-pay

## 2020-05-16 ENCOUNTER — Encounter: Payer: Self-pay | Admitting: Obstetrics and Gynecology

## 2020-05-16 ENCOUNTER — Ambulatory Visit (INDEPENDENT_AMBULATORY_CARE_PROVIDER_SITE_OTHER): Payer: 59 | Admitting: Obstetrics and Gynecology

## 2020-05-16 VITALS — BP 110/74 | HR 111 | Ht 68.0 in | Wt 162.0 lb

## 2020-05-16 DIAGNOSIS — K3184 Gastroparesis: Secondary | ICD-10-CM

## 2020-05-16 DIAGNOSIS — Z3009 Encounter for other general counseling and advice on contraception: Secondary | ICD-10-CM | POA: Diagnosis not present

## 2020-05-16 DIAGNOSIS — R112 Nausea with vomiting, unspecified: Secondary | ICD-10-CM | POA: Diagnosis not present

## 2020-05-16 DIAGNOSIS — Z30017 Encounter for initial prescription of implantable subdermal contraceptive: Secondary | ICD-10-CM | POA: Diagnosis not present

## 2020-05-16 LAB — PREGNANCY, URINE: Preg Test, Ur: NEGATIVE

## 2020-05-16 MED ORDER — TECHNETIUM TC 99M SULFUR COLLOID
2.1000 | Freq: Once | INTRAVENOUS | Status: AC | PRN
  Administered 2020-05-16: 2.1 via INTRAVENOUS

## 2020-05-16 NOTE — Telephone Encounter (Signed)
Okay with me 

## 2020-05-16 NOTE — Patient Instructions (Signed)
Nexplanon Instructions After Insertion  Keep bandage clean and dry for 24 hours  May use ice/Tylenol/Ibuprofen for soreness or pain  If you develop fever, drainage or increased warmth from incision site-contact office immediately   

## 2020-05-16 NOTE — Progress Notes (Signed)
GYNECOLOGY  VISIT   HPI: 30 y.o.   Significant Other Black or African American Not Hispanic or Latino  female   [id number] with Patient's last menstrual period was 04/29/2020.   here for nexplanon insertion.  Patient was last sexually active a few weeks ago and used condoms. No unprotected intercourse since her last cycle.    GYNECOLOGIC HISTORY: Patient's last menstrual period was 04/29/2020. Contraception:none  Menopausal hormone therapy: none         OB History    Gravida  0   Para  0   Term  0   Preterm  0   AB  0   Living  0     SAB  0   IAB  0   Ectopic  0   Multiple  0   Live Births  0              Patient Active Problem List   Diagnosis Date Noted  . Gastroparesis due to DM ([place]) 05/11/2020  . Intractable vomiting   . 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 Abdominal pain 05/09/2020  . Tachycardia 05/09/2020  . Normocytic anemia 04/20/2020  . Hyperosmolar hyperglycemic state (HHS) ([place]) 02/24/2020  . Intractable nausea and vomiting   . Intractable abdominal pain 01/24/2020  . H. pylori infection 10/07/2019  . Depression with anxiety 07/05/2019  . Diabetic foot ulcer ([place]) 02/21/2018  . Diabetic polyneuropathy associated with type 2 diabetes mellitus ([place]) 09/24/2017  . Pain in right foot 09/24/2017  . Diabetic ketoacidosis without coma associated with type 2 diabetes mellitus ([place]) 01/02/2016  . Gastroparesis   . Leukocytosis   . Diabetes mellitus due to underlying condition, uncontrolled, with hyperosmolarity without coma, without long-term current use of insulin ([place])   . Increased frequency of urination 06/14/2011  . UTI (urinary tract infection) 06/14/2011  . Goiter 10/05/2009  . Uncontrolled diabetes mellitus ([place]) 10/05/2009  . Essential hypertension 10/05/2009    Past Medical History:  Diagnosis Date  . Acute H. pylori gastric ulcer   . Diabetes mellitus ([place])   . Diabetic gastroparesis ([place])    . Gastroparesis   . GERD (gastroesophageal reflux disease)   . Hypertension   . Hyperthyroidism     Past Surgical History:  Procedure Laterality Date  . AMPUTATION TOE Left 03/10/2018   Procedure: AMPUTATION FIFTH TOE;  Surgeon: Trula Slade, DPM;  Location: [place];  Service: Podiatry;  Laterality: Left;  . BIOPSY  01/28/2020   Procedure: BIOPSY;  Surgeon: Otis Brace, MD;  Location: [facility];  Service: Gastroenterology;;  . ESOPHAGOGASTRODUODENOSCOPY N/A 01/28/2020   Procedure: ESOPHAGOGASTRODUODENOSCOPY (EGD);  Surgeon: Otis Brace, MD;  Location: [facility];  Service: Gastroenterology;  Laterality: N/A;  . ESOPHAGOGASTRODUODENOSCOPY (EGD) WITH PROPOFOL Left 09/08/2015   Procedure: ESOPHAGOGASTRODUODENOSCOPY (EGD) WITH PROPOFOL;  Surgeon: Arta Silence, MD;  Location: [facility]COPY;  Service: Endoscopy;  Laterality: Left;  . WISDOM TOOTH EXTRACTION      Current Outpatient Medications  Medication Sig Dispense Refill  . atenolol (TENORMIN) 25 MG tablet Take 1 tablet (25 mg total) by mouth 2 (two) times daily. 60 tablet 2  . Blood Glucose Monitoring Suppl (TRUE METRIX METER) w/Device KIT 1 Device by Does not apply route 4 (four) times daily. 1 kit 0  . famotidine (PEPCID) 20 MG tablet Take 1 tablet (20 mg total) by mouth 2 (two) times daily. 60 tablet 11  . feeding supplement, ENSURE COMPLETE, (ENSURE COMPLETE) LIQD Take 237 mLs  by mouth 2 (two) times daily between meals. 14220 mL 3  . glucose blood (TRUE METRIX BLOOD GLUCOSE TEST) test strip Use as instructed 100 each 12  . hyoscyamine (LEVBID) 0.375 MG 12 hr tablet Take 1 tablet (0.375 mg total) by mouth every 12 (twelve) hours as needed for cramping (abdominal pain). 60 tablet 0  . insulin degludec (TRESIBA FLEXTOUCH) 100 UNIT/ML FlexTouch Pen Inject 13 Units into the skin at bedtime.    . Insulin Pen Needle 31G X 5 MM MISC 1 Device by Does not apply route QID. For use with insulin pens 100  each 0  . insulin regular (NOVOLIN R) 100 units/mL injection Inject 0-0.15 mLs (0-15 Units total) into the skin 3 (three) times daily before meals. CBG 70 - 120: 0 units CBG 121 - 150: 2 units CBG 151 - 200: 3 units CBG 201 - 250: 5 units CBG 251 - 300: 8 units CBG 301 - 350: 11 units CBG 351 - 400: 15 units 10 mL 0  . lisinopril (ZESTRIL) 5 MG tablet Take 5 mg by mouth daily.     Derrill Memo ON 05/17/2020] metoCLOPramide (REGLAN) 5 MG tablet Take 1 tablet (5 mg total) by mouth 3 (three) times daily before meals. 90 tablet 6  . Multiple Vitamin (MULTIVITAMIN WITH MINERALS) TABS tablet Take 1 tablet by mouth daily.    . ondansetron (ZOFRAN ODT) 4 MG disintegrating tablet Take 1 tablet (4 mg total) by mouth every 6 (six) hours as needed for nausea or vomiting. 40 tablet 6  . pantoprazole (PROTONIX) 40 MG tablet Take 1 tablet (40 mg total) by mouth 2 (two) times daily. 60 tablet 0  . promethazine (PHENERGAN) 12.5 MG tablet Take 1 tablet (12.5 mg total) by mouth every 6 (six) hours as needed for nausea or vomiting. 30 tablet 0  . TRUEPLUS LANCETS 28G MISC assist with checking blood sugar TID and qhs 100 each 3   No current facility-administered medications for this visit.     ALLERGIES: Patient has no known allergies.  Family History  Problem Relation Age of Onset  . Lung cancer Mother   . Diabetes Mother   . Bipolar disorder Father   . Liver cancer Maternal Grandfather   . Breast cancer Other        maternal great aunt  . Heart disease Other   . Ovarian cancer Other        maternal great aunt  . Pancreatic cancer Maternal Uncle   . Prostate cancer Maternal Uncle   . Diabetes Maternal Aunt        x 2  . Kidney disease Other        maternal great aunt    Social History   Socioeconomic History  . Marital status: Significant Other    Spouse name: Not on file  . Number of children: 0  . Years of education: Not on file  . Highest education level: Not on file  Occupational History   . Occupation: Estate manager/land agent  Tobacco Use  . Smoking status: Never Smoker  . Smokeless tobacco: Never Used  Vaping Use  . Vaping Use: Never used  Substance and Sexual Activity  . Alcohol use: Yes    Alcohol/week: 1.0 standard drink    Types: 1 Shots of liquor per week    Comment: occasional   . Drug use: No  . Sexual activity: Yes    Birth control/protection: None, Condom  Other Topics Concern  . Not on file  Social History Narrative  . Not on file   Social Determinants of Health   Financial Resource Strain: Not on file  Food Insecurity: Not on file  Transportation Needs: Not on file  Physical Activity: Not on file  Stress: Not on file  Social Connections: Not on file  Intimate Partner Violence: Not on file    ROS   UPT negative  PHYSICAL EXAMINATION:    BP 110/74   Pulse (!) 111   Ht 5' 8"  (1.727 m)   Wt 162 lb (73.5 kg)   LMP 04/29/2020 Comment: neg upt  SpO2 100%   BMI 24.63 kg/m     General appearance: alert, cooperative and appears stated age  Risks of nexplanon removal and reinsertion were reviewed with the patient and a consent was signed.  The patient was placed in the supine position with her left arm bent at the elbow. The area was cleansed with betadine and injected with 1% lidocaine along the path where the nexplanon would be placed. The nexplanon device was inserted in the usual fashion without difficulty. Slight oozing from the insertion site was stopped with pressure. The device was palpated in place.  The patients arm was cleansed of betadine and a steri strip was placed over the incision. A gauze was wrapped around her arm.  She tolerated the procedure well  Instructions for care were discussed.   1. Nexplanon insertion Discussed side effects, no contraindications Nexplanon placed Care reviewed  2. Encounter for counseling regarding contraception - Insertion of implanon rod - Pregnancy, urine  CC: Marny Lowenstein, NP

## 2020-05-17 ENCOUNTER — Telehealth: Payer: Self-pay | Admitting: *Deleted

## 2020-05-17 NOTE — Telephone Encounter (Signed)
Admin - please call to schedule TOC to Dr. Veto Kemps

## 2020-05-17 NOTE — Chronic Care Management (AMB) (Signed)
  Care Management   Note  05/17/2020 Name: Kelli Hudson MRN: [id number] DOB: 1991-01-10  Kelli Hudson is a 30 y.o. year old female who is a primary care patient of Mliss Sax, MD. I reached out to [employer] by phone today in response to a referral sent by Kelli Hudson PCP,. Mliss Sax, MD   Kelli Hudson was given information about care management services today including:  1. Care management services include personalized support from designated clinical staff supervised by her physician, including individualized plan of care and coordination with other care providers 2. 24/7 contact phone numbers for assistance for urgent and routine care needs. 3. The patient may stop care management services at any time by phone call to the office staff.  Patient agreed to services and verbal consent obtained.   Follow up plan: Telephone appointment with care management team member scheduled for: 05/28/2020  [facility] Guide, Embedded Care Coordination [facility] Management

## 2020-05-17 NOTE — Telephone Encounter (Signed)
We are scheduling TOC appointments for Dr. Veto Kemps out in May. She needs to be seen for a HFU, how would you like to schedule this? Is it okay to schedule a sooner TOC appointment or schedule a HFU and do the [facility] appointment at a later time? Please advise.

## 2020-05-18 NOTE — Telephone Encounter (Addendum)
Ok to schedule sooner in hosp f/u slot as TOC/Hosp f/u please. I think before 4/11 is needed for TCM

## 2020-05-21 ENCOUNTER — Encounter: Payer: Self-pay | Admitting: Family Medicine

## 2020-05-21 ENCOUNTER — Other Ambulatory Visit: Payer: Self-pay

## 2020-05-21 ENCOUNTER — Telehealth: Payer: Self-pay | Admitting: Physician Assistant

## 2020-05-21 MED ORDER — METOCLOPRAMIDE HCL 5 MG PO TABS: 5.0000 mg | ORAL_TABLET | Freq: Three times a day (TID) | ORAL | 5 refills | Status: DC

## 2020-05-21 NOTE — Telephone Encounter (Signed)
Appointment made for pt on 05/22/20 with Dr. Veto Kemps.

## 2020-05-21 NOTE — Telephone Encounter (Signed)
Patient's mother's questions answered.  She will call back for any additional questions or concerns.

## 2020-05-21 NOTE — Telephone Encounter (Signed)
Inbound call from patient's mother requesting a call back from the nurse please; has additional questions in regards to patient's results.

## 2020-05-21 NOTE — Telephone Encounter (Signed)
Called and left patient a detailed message to call the office and schedule a sooner TOC/HFU appointment (this week). This has been ok'd by Dr. Veto Kemps. Per Kelli Hudson Bad schedule for 1 hour.

## 2020-05-22 ENCOUNTER — Encounter: Payer: Self-pay | Admitting: Family Medicine

## 2020-05-22 ENCOUNTER — Ambulatory Visit (INDEPENDENT_AMBULATORY_CARE_PROVIDER_SITE_OTHER): Payer: 59 | Admitting: Family Medicine

## 2020-05-22 VITALS — HR 120 | Temp 98.3°F | Ht 68.0 in | Wt 166.0 lb

## 2020-05-22 DIAGNOSIS — E1142 Type 2 diabetes mellitus with diabetic polyneuropathy: Secondary | ICD-10-CM

## 2020-05-22 DIAGNOSIS — I10 Essential (primary) hypertension: Secondary | ICD-10-CM | POA: Diagnosis not present

## 2020-05-22 DIAGNOSIS — E1143 Type 2 diabetes mellitus with diabetic autonomic (poly)neuropathy: Secondary | ICD-10-CM | POA: Diagnosis not present

## 2020-05-22 DIAGNOSIS — E1165 Type 2 diabetes mellitus with hyperglycemia: Secondary | ICD-10-CM

## 2020-05-22 DIAGNOSIS — K3184 Gastroparesis: Secondary | ICD-10-CM

## 2020-05-22 DIAGNOSIS — D649 Anemia, unspecified: Secondary | ICD-10-CM | POA: Diagnosis not present

## 2020-05-22 DIAGNOSIS — Z23 Encounter for immunization: Secondary | ICD-10-CM | POA: Diagnosis not present

## 2020-05-22 NOTE — Progress Notes (Signed)
[facility] [address] [place] [address] Dept: [phone] Dept Fax: [phone]  Transfer of Care Office Visit  Subjective:    Patient ID: Kelli Hudson, female    DOB: April 13, 1990, 30 y.o..   MRN: [id number]  Chief Complaint  Patient presents with  . Establish Care    TOC-hospital f/u from 05/09/20 for N&V.  She reports feeling much better.      History of Present Illness:  Patient is in today to establish care.  she is accompanied by her mother, who frequently interjects to provide additional history, her perspective, and her concerns for Kelli Hudson's medical issues.  Kelli Hudson was hospitalized recently with an episode of gastroparesis. She improved with IV hydration and medication. During her hospitalization, she was noted to have an HbA1c of 11. She states her blood sugars fluctuate between 100-300s, but admits at times she does not take her insulin due to fears of having a low blood sugar. She has had rare episodes of hypoglycemia in the past. She is no longer seeing her previous endocrinologist due to changes in her insurance. Kelli Hudson has a history of Type 2 diabetes first diagnosed in 2008 (age 56). She is currently managed on insulin for this. 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 She notes she has been being followed by ophthalmology having had laser treatment for her etinopathy in Jan, though she cannot recall her eye doctor's name.  Kelli Hudson struggles with her gastroparesis. She finds that she often feels full and can't eat the type or quantity of foods she might want. She finds that the foods that would be best for her diabetes are not the ones that she can tolerate. Her mother continues to voice concerns about the potential long-term risks associated with use of Reglan to  manage her gastroparesis. She did recently compelte a gastric emptying study.  Kelli Hudson has a history of hypertension. Her blood pressure is well controlled on lisinopril.  Past Medical History: Patient Active Problem List   Diagnosis Date Noted  . Gastroparesis due to DM ([place]) 05/11/2020  . 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 Tachycardia 05/09/2020  . Normocytic anemia 04/20/2020  . Hyperosmolar hyperglycemic state (HHS) ([place]) 02/24/2020  . Intractable nausea and vomiting   . Intractable abdominal pain 01/24/2020  . H. pylori infection 10/07/2019  . Depression with anxiety 07/05/2019  . Diabetic foot ulcer ([place]) 02/21/2018  . Diabetic polyneuropathy associated with type 2 diabetes mellitus ([place]) 09/24/2017  . Pain in right foot 09/24/2017  . Diabetic ketoacidosis without coma associated with type 2 diabetes mellitus ([place]) 01/02/2016  . Gastroparesis   . Leukocytosis   . Diabetes mellitus due to underlying condition, uncontrolled, with hyperosmolarity without coma, without long-term current use of insulin ([place])   . Increased frequency of urination 06/14/2011  . UTI (urinary tract infection) 06/14/2011  . Goiter 10/05/2009  . Uncontrolled diabetes mellitus ([place]) 10/05/2009  . Essential hypertension 10/05/2009   Past Surgical History:  Procedure Laterality Date  . AMPUTATION TOE Left 03/10/2018   Procedure: AMPUTATION FIFTH TOE;  Surgeon: Trula Slade, DPM;  Location: [place];  Service: Podiatry;  Laterality: Left;  . BIOPSY  01/28/2020   Procedure: BIOPSY;  Surgeon: Otis Brace, MD;  Location: [facility];  Service: Gastroenterology;;  . ESOPHAGOGASTRODUODENOSCOPY N/A 01/28/2020   Procedure: ESOPHAGOGASTRODUODENOSCOPY (EGD);  Surgeon: Otis Brace, MD;  Location: [facility];  Service: Gastroenterology;  Laterality: N/A;  . ESOPHAGOGASTRODUODENOSCOPY (EGD) WITH PROPOFOL Left 09/08/2015    Procedure: ESOPHAGOGASTRODUODENOSCOPY (EGD) WITH PROPOFOL;  Surgeon: Arta Silence, MD;  Location: [facility] END[facility]COPY;  Service: Endoscopy;  Laterality: Left;  . WISDOM TOOTH EXTRACTION     Family History  Problem Relation Age of Onset  . Lung cancer Mother   . Diabetes Mother   . Bipolar disorder Father   . Liver cancer Maternal Grandfather   . Breast cancer Other        maternal great aunt  . Heart disease Other   . Ovarian cancer Other        maternal great aunt  . Pancreatic cancer Maternal Uncle   . Prostate cancer Maternal Uncle   . Diabetes Maternal Aunt        x 2  . Kidney disease Other        maternal great aunt   Outpatient Medications Prior to Visit  Medication Sig Dispense Refill  . atenolol (TENORMIN) 25 MG tablet Take 1 tablet (25 mg total) by mouth 2 (two) times daily. 60 tablet 2  . Blood Glucose Monitoring Suppl (TRUE METRIX METER) w/Device KIT 1 Device by Does not apply route 4 (four) times daily. 1 kit 0  . famotidine (PEPCID) 20 MG tablet Take 1 tablet (20 mg total) by mouth 2 (two) times daily. 60 tablet 11  . feeding supplement, ENSURE COMPLETE, (ENSURE COMPLETE) LIQD Take 237 mLs by mouth 2 (two) times daily between meals. 14220 mL 3  . glucose blood (TRUE METRIX BLOOD GLUCOSE TEST) test strip Use as instructed 100 each 12  . hyoscyamine (LEVBID) 0.375 MG 12 hr tablet Take 1 tablet (0.375 mg total) by mouth every 12 (twelve) hours as needed for cramping (abdominal pain). 60 tablet 0  . insulin degludec (TRESIBA FLEXTOUCH) 100 UNIT/ML FlexTouch Pen Inject 13 Units into the skin at bedtime.    . Insulin Pen Needle 31G X 5 MM MISC 1 Device by Does not apply route QID. For use with insulin pens 100 each 0  . lisinopril (ZESTRIL) 5 MG tablet Take 5 mg by mouth daily.     . metoCLOPramide (REGLAN) 5 MG tablet Take 1 tablet (5 mg total) by mouth 3 (three) times daily before meals. 90 tablet 5  . Multiple Vitamin (MULTIVITAMIN WITH MINERALS) TABS tablet Take 1 tablet by  mouth daily.    . ondansetron (ZOFRAN ODT) 4 MG disintegrating tablet Take 1 tablet (4 mg total) by mouth every 6 (six) hours as needed for nausea or vomiting. 40 tablet 6  . pantoprazole (PROTONIX) 40 MG tablet Take 1 tablet (40 mg total) by mouth 2 (two) times daily. 60 tablet 0  . promethazine (PHENERGAN) 12.5 MG tablet Take 1 tablet (12.5 mg total) by mouth every 6 (six) hours as needed for nausea or vomiting. 30 tablet 0  . TRUEPLUS LANCETS 28G MISC assist with checking blood sugar TID and qhs 100 each 3  . insulin regular (NOVOLIN R) 100 units/mL injection Inject 0-0.15 mLs (0-15 Units total) into the skin 3 (three) times daily before meals. CBG 70 - 120: 0 units CBG 121 - 150: 2 units CBG 151 - 200: 3 units CBG 201 - 250: 5 units CBG 251 - 300: 8 units CBG 301 - 350: 11 units CBG 351 - 400: 15 units (Patient not taking: Reported on 05/22/2020) 10 mL 0   No facility-administered medications prior to visit.   No Known Allergies  Objective:   Today's Vitals   05/22/20 1314  Pulse: (!) 120  Temp: 98.3 F (36.8 C)  TempSrc: Temporal  SpO2: 96%  Weight: 166 lb (75.3 kg)  Height: 5' 8"  (1.727 m)   Body mass index is 25.24 kg/m.   General: Well developed, well nourished. No acute distress. Feet: Skin intact, though mildly dry. DP and PT pulses 2+. There is decreased sensation with 5.07 monofilament throughout most areas on both   feet. There is also decreased vibratory sensation on the right. Psych: Alert and oriented x3. Normal mood and affect.  Health Maintenance Due  Topic Date Due  . Hepatitis C Screening  Never done  . PNEUMOCOCCAL POLYSACCHARIDE VACCINE AGE 71-64 HIGH RISK  Never done  . FOOT EXAM  Never done  . OPHTHALMOLOGY EXAM  Never done  . PAP SMEAR-Modifier  Never done   Imaging: Gastric Emptying Study (05/16/2020)  FINDINGS: Expected location of the stomach in the left upper quadrant. Ingested meal empties the stomach gradually over the course of  the study.  0% emptied at 1 hr ( normal >= 10%)  6% emptied at 2 hr ( normal >= 40%)  6% emptied at 3 hr ( normal >= 70%)  6% emptied at 4 hr ( normal >= 90%)  IMPRESSION: Severely delayed gastric emptying study.    Assessment & Plan:  1. Uncontrolled type 2 diabetes mellitus with hyperglycemia ([place]) Kelli Hudson's recent HbA1c indicates her diabetes is in poor control. We discussed about her concern with hypoglycemia and reviewed actions to take related to this. I urged her to take her insulin daily and attempt to eat at regular mealtimes. Her diabetes care is quite complex to to her many complications. I will refer her to endocrinology to assist in management. I will check annual DM labs to evalute her current status and follow-up on some metabolic, mineral and electrolyte issues from her recent hospitalization. I asked her to have her eye doctor send records after her next assessment.  - Lipid panel - Microalbumin / creatinine urine ratio - Basic metabolic panel - Urinalysis, Routine w reflex microscopic -- Comprehensive metabolic panel - Ambulatory referral to Endocrinology  2. Gastroparesis due to DM [facility]) Reviewed recent hospital discharge note and gastroenterology assessment. Kelli Hudson recent GES confirms sever gastroparesis. I will refer her to nutrition to discuss potential ways to combine nutritional needs related to her diabetes and limitations imposed by her gastroparesis. I do believe the benefits associated with her long-term use of Reglan outweigh the potential risks.   - Amb ref to Medical Nutrition Therapy-MNT  3. Essential hypertension Blood pressure is at goal. Continue lisinopril.  4. Hypomagnesemia Will follow-up on low magnesium and phosphorous from recent hospitalization.  - Magnesium - Phosphorus  5. Normocytic anemia Will follow-up on anemia from recent hospitalization.  - CBC  6. Need for pneumococcal vaccination  -  Pneumococcal polysaccharide vaccine 23-valent greater than or equal to 2yo subcutaneous/IM  7. Diabetic polyneuropathy associated with type 2 diabetes mellitus ([place]) We reviewed diabetic foot care, including daily visual inspection, liberal use of lotions, consistent use of footwear at all times, and early medical assessment of injuries or infections.  I spent 60 + minutes reviewing hospital and consultant notes, prior imaging studies, prior labs, taking history, conducting exam, developing a plan and documenting this encounter.  [name], MD

## 2020-05-23 ENCOUNTER — Encounter: Payer: Self-pay | Admitting: Family Medicine

## 2020-05-23 DIAGNOSIS — E785 Hyperlipidemia, unspecified: Secondary | ICD-10-CM | POA: Insufficient documentation

## 2020-05-23 LAB — CBC
HCT: 33.6 % — ABNORMAL LOW (ref 36.0–46.0)
Hemoglobin: 11.3 g/dL — ABNORMAL LOW (ref 12.0–15.0)
MCHC: 33.8 g/dL (ref 30.0–36.0)
MCV: 89.4 fl (ref 78.0–100.0)
Platelets: 371 10*3/uL (ref 150.0–400.0)
RBC: 3.76 Mil/uL — ABNORMAL LOW (ref 3.87–5.11)
RDW: 13.6 % (ref 11.5–15.5)
WBC: 7 10*3/uL (ref 4.0–10.5)

## 2020-05-23 LAB — COMPREHENSIVE METABOLIC PANEL
ALT: 26 U/L (ref 0–35)
AST: 19 U/L (ref 0–37)
Albumin: 3.6 g/dL (ref 3.5–5.2)
Alkaline Phosphatase: 86 U/L (ref 39–117)
BUN: 25 mg/dL — ABNORMAL HIGH (ref 6–23)
CO2: 32 mEq/L (ref 19–32)
Calcium: 9.5 mg/dL (ref 8.4–10.5)
Chloride: 101 mEq/L (ref 96–112)
Creatinine, Ser: 0.95 mg/dL (ref 0.40–1.20)
GFR: 81.27 mL/min (ref >60.00)
Glucose, Bld: 162 mg/dL — ABNORMAL HIGH (ref 70–99)
Potassium: 4.6 mEq/L (ref 3.5–5.1)
Sodium: 139 mEq/L (ref 135–145)
Total Bilirubin: 0.5 mg/dL (ref 0.2–1.2)
Total Protein: 6.4 g/dL (ref 6.0–8.3)

## 2020-05-23 LAB — LIPID PANEL
Cholesterol: 231 mg/dL — ABNORMAL HIGH (ref 0–200)
HDL: 76.3 mg/dL (ref >39.00)
LDL Cholesterol: 132 mg/dL — ABNORMAL HIGH (ref 0–99)
NonHDL: 155.09
Total CHOL/HDL Ratio: 3
Triglycerides: 113 mg/dL (ref 0.0–149.0)
VLDL: 22.6 mg/dL (ref 0.0–40.0)

## 2020-05-23 LAB — URINALYSIS, ROUTINE W REFLEX MICROSCOPIC
Bilirubin Urine: NEGATIVE
Hgb urine dipstick: NEGATIVE
Ketones, ur: NEGATIVE
Leukocytes,Ua: NEGATIVE
Nitrite: NEGATIVE
Specific Gravity, Urine: 1.015 (ref 1.000–1.030)
Total Protein, Urine: 100 — AB
Urine Glucose: NEGATIVE
Urobilinogen, UA: 0.2 (ref 0.0–1.0)
pH: 7 (ref 5.0–8.0)

## 2020-05-23 LAB — BASIC METABOLIC PANEL
BUN: 25 mg/dL — ABNORMAL HIGH (ref 6–23)
CO2: 32 mEq/L (ref 19–32)
Calcium: 9.5 mg/dL (ref 8.4–10.5)
Chloride: 101 mEq/L (ref 96–112)
Creatinine, Ser: 0.95 mg/dL (ref 0.40–1.20)
GFR: 81.27 mL/min (ref >60.00)
Glucose, Bld: 162 mg/dL — ABNORMAL HIGH (ref 70–99)
Potassium: 4.6 mEq/L (ref 3.5–5.1)
Sodium: 139 mEq/L (ref 135–145)

## 2020-05-23 LAB — PHOSPHORUS: Phosphorus: 5.1 mg/dL — ABNORMAL HIGH (ref 2.3–4.6)

## 2020-05-23 LAB — MICROALBUMIN / CREATININE URINE RATIO
Creatinine,U: 72.4 mg/dL
Microalb Creat Ratio: 66.9 mg/g — ABNORMAL HIGH (ref 0.0–30.0)
Microalb, Ur: 48.4 mg/dL — ABNORMAL HIGH (ref 0.0–1.9)

## 2020-05-23 LAB — MAGNESIUM: Magnesium: 1.7 mg/dL (ref 1.5–2.5)

## 2020-05-27 ENCOUNTER — Encounter (HOSPITAL_COMMUNITY): Payer: Self-pay | Admitting: Emergency Medicine

## 2020-05-27 ENCOUNTER — Emergency Department (HOSPITAL_COMMUNITY): Payer: 59

## 2020-05-27 ENCOUNTER — Other Ambulatory Visit: Payer: Self-pay

## 2020-05-27 ENCOUNTER — Inpatient Hospital Stay (HOSPITAL_COMMUNITY)
Admission: EM | Admit: 2020-05-27 | Discharge: 2020-05-28 | DRG: 871 | Disposition: A | Discharge status: Home / Self Care | Payer: 59 | Attending: Internal Medicine | Admitting: Internal Medicine

## 2020-05-27 DIAGNOSIS — Z833 Family history of diabetes mellitus: Secondary | ICD-10-CM

## 2020-05-27 DIAGNOSIS — Z79899 Other long term (current) drug therapy: Secondary | ICD-10-CM

## 2020-05-27 DIAGNOSIS — U071 COVID-19: Secondary | ICD-10-CM | POA: Diagnosis present

## 2020-05-27 DIAGNOSIS — Z800 Family history of malignant neoplasm of digestive organs: Secondary | ICD-10-CM

## 2020-05-27 DIAGNOSIS — R509 Fever, unspecified: Secondary | ICD-10-CM | POA: Diagnosis present

## 2020-05-27 DIAGNOSIS — Z803 Family history of malignant neoplasm of breast: Secondary | ICD-10-CM | POA: Diagnosis not present

## 2020-05-27 DIAGNOSIS — E1165 Type 2 diabetes mellitus with hyperglycemia: Secondary | ICD-10-CM | POA: Diagnosis not present

## 2020-05-27 DIAGNOSIS — A4189 Other specified sepsis: Secondary | ICD-10-CM | POA: Diagnosis present

## 2020-05-27 DIAGNOSIS — J690 Pneumonitis due to inhalation of food and vomit: Secondary | ICD-10-CM | POA: Diagnosis present

## 2020-05-27 DIAGNOSIS — E039 Hypothyroidism, unspecified: Secondary | ICD-10-CM | POA: Diagnosis present

## 2020-05-27 DIAGNOSIS — K3184 Gastroparesis: Secondary | ICD-10-CM | POA: Diagnosis present

## 2020-05-27 DIAGNOSIS — I10 Essential (primary) hypertension: Secondary | ICD-10-CM | POA: Diagnosis present

## 2020-05-27 DIAGNOSIS — Z8041 Family history of malignant neoplasm of ovary: Secondary | ICD-10-CM | POA: Diagnosis not present

## 2020-05-27 DIAGNOSIS — A419 Sepsis, unspecified organism: Secondary | ICD-10-CM | POA: Diagnosis present

## 2020-05-27 DIAGNOSIS — E1043 Type 1 diabetes mellitus with diabetic autonomic (poly)neuropathy: Secondary | ICD-10-CM | POA: Diagnosis present

## 2020-05-27 DIAGNOSIS — R0602 Shortness of breath: Secondary | ICD-10-CM

## 2020-05-27 DIAGNOSIS — E785 Hyperlipidemia, unspecified: Secondary | ICD-10-CM | POA: Diagnosis present

## 2020-05-27 DIAGNOSIS — K219 Gastro-esophageal reflux disease without esophagitis: Secondary | ICD-10-CM | POA: Diagnosis present

## 2020-05-27 DIAGNOSIS — Z794 Long term (current) use of insulin: Secondary | ICD-10-CM | POA: Diagnosis not present

## 2020-05-27 DIAGNOSIS — D649 Anemia, unspecified: Secondary | ICD-10-CM | POA: Diagnosis present

## 2020-05-27 DIAGNOSIS — E1065 Type 1 diabetes mellitus with hyperglycemia: Secondary | ICD-10-CM | POA: Diagnosis present

## 2020-05-27 DIAGNOSIS — J189 Pneumonia, unspecified organism: Secondary | ICD-10-CM

## 2020-05-27 DIAGNOSIS — Z818 Family history of other mental and behavioral disorders: Secondary | ICD-10-CM

## 2020-05-27 DIAGNOSIS — Z801 Family history of malignant neoplasm of trachea, bronchus and lung: Secondary | ICD-10-CM | POA: Diagnosis not present

## 2020-05-27 LAB — CBC WITH DIFFERENTIAL/PLATELET
Abs Immature Granulocytes: 0.04 10*3/uL (ref 0.00–0.07)
Basophils Absolute: 0 10*3/uL (ref 0.0–0.1)
Basophils Relative: 0 %
Eosinophils Absolute: 0 10*3/uL (ref 0.0–0.5)
Eosinophils Relative: 0 %
HCT: 27.6 % — ABNORMAL LOW (ref 36.0–46.0)
Hemoglobin: 8.5 g/dL — ABNORMAL LOW (ref 12.0–15.0)
Immature Granulocytes: 0 %
Lymphocytes Relative: 18 %
Lymphs Abs: 1.8 10*3/uL (ref 0.7–4.0)
MCH: 29.6 pg (ref 26.0–34.0)
MCHC: 30.8 g/dL (ref 30.0–36.0)
MCV: 96.2 fL (ref 80.0–100.0)
Monocytes Absolute: 0.8 10*3/uL (ref 0.1–1.0)
Monocytes Relative: 8 %
Neutro Abs: 7.3 10*3/uL (ref 1.7–7.7)
Neutrophils Relative %: 74 %
Platelets: 248 10*3/uL (ref 150–400)
RBC: 2.87 MIL/uL — ABNORMAL LOW (ref 3.87–5.11)
RDW: 13.1 % (ref 11.5–15.5)
WBC: 9.9 10*3/uL (ref 4.0–10.5)
nRBC: 0 % (ref 0.0–0.2)

## 2020-05-27 LAB — COMPREHENSIVE METABOLIC PANEL
ALT: 20 U/L (ref 0–44)
AST: 20 U/L (ref 15–41)
Albumin: 1.9 g/dL — ABNORMAL LOW (ref 3.5–5.0)
Alkaline Phosphatase: 34 U/L — ABNORMAL LOW (ref 38–126)
Anion gap: 12 (ref 5–15)
BUN: 13 mg/dL (ref 6–20)
CO2: 17 mmol/L — ABNORMAL LOW (ref 22–32)
Calcium: 7.5 mg/dL — ABNORMAL LOW (ref 8.9–10.3)
Chloride: 107 mmol/L (ref 98–111)
Creatinine, Ser: 0.69 mg/dL (ref 0.44–1.00)
GFR, Estimated: >60 mL/min (ref >60)
Glucose, Bld: 149 mg/dL — ABNORMAL HIGH (ref 70–99)
Potassium: 3.6 mmol/L (ref 3.5–5.1)
Sodium: 136 mmol/L (ref 135–145)
Total Bilirubin: 0.3 mg/dL (ref 0.3–1.2)
Total Protein: 4.1 g/dL — ABNORMAL LOW (ref 6.5–8.1)

## 2020-05-27 LAB — CBC
HCT: 32.4 % — ABNORMAL LOW (ref 36.0–46.0)
Hemoglobin: 10.4 g/dL — ABNORMAL LOW (ref 12.0–15.0)
MCH: 30 pg (ref 26.0–34.0)
MCHC: 32.1 g/dL (ref 30.0–36.0)
MCV: 93.4 fL (ref 80.0–100.0)
Platelets: 272 10*3/uL (ref 150–400)
RBC: 3.47 MIL/uL — ABNORMAL LOW (ref 3.87–5.11)
RDW: 13.2 % (ref 11.5–15.5)
WBC: 10.7 10*3/uL — ABNORMAL HIGH (ref 4.0–10.5)
nRBC: 0 % (ref 0.0–0.2)

## 2020-05-27 LAB — BLOOD GAS, VENOUS
Acid-base deficit: 0.5 mmol/L (ref 0.0–2.0)
Bicarbonate: 24.7 mmol/L (ref 20.0–28.0)
O2 Saturation: 68.9 %
Patient temperature: 98.6
pCO2, Ven: 46 mmHg (ref 44.0–60.0)
pH, Ven: 7.35 (ref 7.250–7.430)
pO2, Ven: 40.1 mmHg (ref 32.0–45.0)

## 2020-05-27 LAB — CBG MONITORING, ED: Glucose-Capillary: 175 mg/dL — ABNORMAL HIGH (ref 70–99)

## 2020-05-27 LAB — TYPE AND SCREEN
ABO/RH(D): O POS
Antibody Screen: NEGATIVE

## 2020-05-27 LAB — RESP PANEL BY RT-PCR (FLU A&B, COVID) ARPGX2
Influenza A by PCR: NEGATIVE
Influenza B by PCR: NEGATIVE
SARS Coronavirus 2 by RT PCR: POSITIVE — AB

## 2020-05-27 LAB — I-STAT BETA HCG BLOOD, ED (MC, WL, AP ONLY): I-stat hCG, quantitative: 6.7 m[IU]/mL — ABNORMAL HIGH (ref <5)

## 2020-05-27 LAB — ABO/RH: ABO/RH(D): O POS

## 2020-05-27 LAB — BETA-HYDROXYBUTYRIC ACID: Beta-Hydroxybutyric Acid: 0.73 mmol/L — ABNORMAL HIGH (ref 0.05–0.27)

## 2020-05-27 LAB — GLUCOSE, CAPILLARY: Glucose-Capillary: 220 mg/dL — ABNORMAL HIGH (ref 70–99)

## 2020-05-27 LAB — PROTIME-INR
INR: 1.1 (ref 0.8–1.2)
Prothrombin Time: 13.3 seconds (ref 11.4–15.2)

## 2020-05-27 LAB — LACTIC ACID, PLASMA: Lactic Acid, Venous: 1.8 mmol/L (ref 0.5–1.9)

## 2020-05-27 LAB — PROCALCITONIN: Procalcitonin: 0.1 ng/mL

## 2020-05-27 LAB — APTT: aPTT: 27 seconds (ref 24–36)

## 2020-05-27 MED ORDER — HYDROCOD POLST-CPM POLST ER 10-8 MG/5ML PO SUER
5.0000 mL | Freq: Two times a day (BID) | ORAL | Status: DC | PRN
  Administered 2020-05-27: 5 mL via ORAL
  Filled 2020-05-27: qty 5

## 2020-05-27 MED ORDER — ONDANSETRON HCL 4 MG PO TABS: 4.0000 mg | ORAL_TABLET | Freq: Four times a day (QID) | ORAL | Status: DC | PRN

## 2020-05-27 MED ORDER — LACTATED RINGERS IV BOLUS (SEPSIS)
1000.0000 mL | Freq: Once | INTRAVENOUS | Status: AC
  Administered 2020-05-27: 1000 mL via INTRAVENOUS

## 2020-05-27 MED ORDER — ADULT MULTIVITAMIN W/MINERALS CH
1.0000 | ORAL_TABLET | Freq: Every day | ORAL | Status: DC
  Administered 2020-05-28: 1 via ORAL
  Filled 2020-05-27: qty 1

## 2020-05-27 MED ORDER — UMECLIDINIUM BROMIDE 62.5 MCG/INH IN AEPB
1.0000 | INHALATION_SPRAY | Freq: Every day | RESPIRATORY_TRACT | Status: DC
  Administered 2020-05-27 – 2020-05-28 (×2): 1 via RESPIRATORY_TRACT
  Filled 2020-05-27: qty 7

## 2020-05-27 MED ORDER — LEVALBUTEROL HCL 0.63 MG/3ML IN NEBU: 0.6300 mg | INHALATION_SOLUTION | Freq: Four times a day (QID) | RESPIRATORY_TRACT | Status: DC

## 2020-05-27 MED ORDER — GUAIFENESIN 100 MG/5ML PO SOLN: 5.0000 mL | ORAL | Status: DC | PRN

## 2020-05-27 MED ORDER — ENOXAPARIN SODIUM 40 MG/0.4ML ~~LOC~~ SOLN
40.0000 mg | SUBCUTANEOUS | Status: DC
  Administered 2020-05-27: 40 mg via SUBCUTANEOUS
  Filled 2020-05-27: qty 0.4

## 2020-05-27 MED ORDER — SODIUM CHLORIDE 0.9 % IV SOLN
2.0000 g | INTRAVENOUS | Status: DC
  Administered 2020-05-27: 2 g via INTRAVENOUS
  Filled 2020-05-27: qty 20

## 2020-05-27 MED ORDER — SODIUM CHLORIDE 0.9 % IV SOLN
2.0000 g | INTRAVENOUS | Status: DC
  Administered 2020-05-28: 2 g via INTRAVENOUS
  Filled 2020-05-27: qty 20

## 2020-05-27 MED ORDER — MENTHOL 3 MG MT LOZG
1.0000 | LOZENGE | OROMUCOSAL | Status: DC | PRN
  Administered 2020-05-27: 3 mg via ORAL
  Filled 2020-05-27: qty 9

## 2020-05-27 MED ORDER — FAMOTIDINE 20 MG PO TABS
20.0000 mg | ORAL_TABLET | Freq: Two times a day (BID) | ORAL | Status: DC
  Administered 2020-05-27 – 2020-05-28 (×2): 20 mg via ORAL
  Filled 2020-05-27 (×2): qty 1

## 2020-05-27 MED ORDER — ACETAMINOPHEN 325 MG PO TABS: 650.0000 mg | ORAL_TABLET | Freq: Four times a day (QID) | ORAL | Status: DC | PRN

## 2020-05-27 MED ORDER — SODIUM CHLORIDE 0.9 % IV SOLN
500.0000 mg | INTRAVENOUS | Status: DC
  Administered 2020-05-27: 500 mg via INTRAVENOUS
  Filled 2020-05-27: qty 500

## 2020-05-27 MED ORDER — LACTATED RINGERS IV SOLN: INTRAVENOUS | Status: AC

## 2020-05-27 MED ORDER — ONDANSETRON HCL 4 MG/2ML IJ SOLN: 4.0000 mg | Freq: Four times a day (QID) | INTRAMUSCULAR | Status: DC | PRN

## 2020-05-27 MED ORDER — LEVALBUTEROL TARTRATE 45 MCG/ACT IN AERO
2.0000 | INHALATION_SPRAY | Freq: Four times a day (QID) | RESPIRATORY_TRACT | Status: DC
  Administered 2020-05-27 – 2020-05-28 (×4): 2 via RESPIRATORY_TRACT
  Filled 2020-05-27: qty 15

## 2020-05-27 MED ORDER — METOCLOPRAMIDE HCL 5 MG PO TABS
5.0000 mg | ORAL_TABLET | Freq: Three times a day (TID) | ORAL | Status: DC
  Administered 2020-05-28 (×2): 5 mg via ORAL
  Filled 2020-05-27 (×2): qty 1

## 2020-05-27 MED ORDER — SODIUM CHLORIDE 0.9 % IV SOLN: 2.0000 g | INTRAVENOUS | Status: DC

## 2020-05-27 MED ORDER — ALBUTEROL SULFATE HFA 108 (90 BASE) MCG/ACT IN AERS: 2.0000 | INHALATION_SPRAY | Freq: Four times a day (QID) | RESPIRATORY_TRACT | Status: DC | PRN

## 2020-05-27 MED ORDER — ATENOLOL 25 MG PO TABS
25.0000 mg | ORAL_TABLET | Freq: Two times a day (BID) | ORAL | Status: DC
  Administered 2020-05-27 – 2020-05-28 (×2): 25 mg via ORAL
  Filled 2020-05-27 (×2): qty 1

## 2020-05-27 MED ORDER — ENSURE ENLIVE PO LIQD
237.0000 mL | Freq: Two times a day (BID) | ORAL | Status: DC
  Administered 2020-05-28 (×2): 237 mL via ORAL

## 2020-05-27 MED ORDER — LEVALBUTEROL TARTRATE 45 MCG/ACT IN AERO: 2.0000 | INHALATION_SPRAY | Freq: Four times a day (QID) | RESPIRATORY_TRACT | Status: DC

## 2020-05-27 MED ORDER — SODIUM CHLORIDE 0.9 % IV SOLN: 500.0000 mg | INTRAVENOUS | Status: DC

## 2020-05-27 MED ORDER — IPRATROPIUM BROMIDE 0.02 % IN SOLN: 0.5000 mg | Freq: Four times a day (QID) | RESPIRATORY_TRACT | Status: DC

## 2020-05-27 MED ORDER — HYDRALAZINE HCL 10 MG PO TABS: 10.0000 mg | ORAL_TABLET | Freq: Four times a day (QID) | ORAL | Status: DC | PRN

## 2020-05-27 MED ORDER — SODIUM CHLORIDE 0.9 % IV SOLN
200.0000 mg | Freq: Once | INTRAVENOUS | Status: AC
  Administered 2020-05-27: 200 mg via INTRAVENOUS
  Filled 2020-05-27: qty 40

## 2020-05-27 MED ORDER — OXYCODONE HCL 5 MG PO TABS
5.0000 mg | ORAL_TABLET | ORAL | Status: DC | PRN
Start: 2020-05-27 — End: 2020-05-28
  Administered 2020-05-27 – 2020-05-28 (×2): 5 mg via ORAL
  Filled 2020-05-27 (×2): qty 1

## 2020-05-27 MED ORDER — SODIUM CHLORIDE 0.9 % IV SOLN
100.0000 mg | Freq: Every day | INTRAVENOUS | Status: DC
  Administered 2020-05-28: 100 mg via INTRAVENOUS
  Filled 2020-05-27: qty 20

## 2020-05-27 MED ORDER — INSULIN ASPART 100 UNIT/ML ~~LOC~~ SOLN
0.0000 [IU] | Freq: Three times a day (TID) | SUBCUTANEOUS | Status: DC
  Administered 2020-05-28: 2 [IU] via SUBCUTANEOUS
  Administered 2020-05-28: 3 [IU] via SUBCUTANEOUS

## 2020-05-27 MED ORDER — ALBUTEROL SULFATE HFA 108 (90 BASE) MCG/ACT IN AERS
2.0000 | INHALATION_SPRAY | Freq: Once | RESPIRATORY_TRACT | Status: AC
  Administered 2020-05-27: 2 via RESPIRATORY_TRACT
  Filled 2020-05-27: qty 6.7

## 2020-05-27 MED ORDER — AEROCHAMBER Z-STAT PLUS/MEDIUM MISC
1.0000 | Freq: Once | Status: AC
  Administered 2020-05-27: 1
  Filled 2020-05-27: qty 1

## 2020-05-27 MED ORDER — PANTOPRAZOLE SODIUM 40 MG PO TBEC
40.0000 mg | DELAYED_RELEASE_TABLET | Freq: Two times a day (BID) | ORAL | Status: DC
  Administered 2020-05-27 – 2020-05-28 (×2): 40 mg via ORAL
  Filled 2020-05-27 (×2): qty 1

## 2020-05-27 MED ORDER — PHENOL 1.4 % MT LIQD
1.0000 | OROMUCOSAL | Status: DC | PRN
  Administered 2020-05-27: 1 via OROMUCOSAL
  Filled 2020-05-27: qty 177

## 2020-05-27 MED ORDER — ACETAMINOPHEN 650 MG RE SUPP: 650.0000 mg | Freq: Four times a day (QID) | RECTAL | Status: DC | PRN

## 2020-05-27 MED ORDER — SODIUM CHLORIDE 0.9 % IV SOLN
500.0000 mg | INTRAVENOUS | Status: DC
  Filled 2020-05-27: qty 500

## 2020-05-27 MED ORDER — GUAIFENESIN ER 600 MG PO TB12
600.0000 mg | ORAL_TABLET | Freq: Two times a day (BID) | ORAL | Status: DC
  Administered 2020-05-27 – 2020-05-28 (×2): 600 mg via ORAL
  Filled 2020-05-27 (×2): qty 1

## 2020-05-27 MED ORDER — LIDOCAINE VISCOUS HCL 2 % MT SOLN
15.0000 mL | Freq: Once | OROMUCOSAL | Status: AC
  Administered 2020-05-27: 15 mL via ORAL
  Filled 2020-05-27: qty 15

## 2020-05-27 MED ORDER — HYOSCYAMINE SULFATE ER 0.375 MG PO TB12
0.3750 mg | ORAL_TABLET | Freq: Two times a day (BID) | ORAL | Status: DC | PRN
  Filled 2020-05-27: qty 1

## 2020-05-27 MED ORDER — LISINOPRIL 5 MG PO TABS
5.0000 mg | ORAL_TABLET | Freq: Every day | ORAL | Status: DC
  Administered 2020-05-27 – 2020-05-28 (×2): 5 mg via ORAL
  Filled 2020-05-27 (×2): qty 1

## 2020-05-27 MED ORDER — INSULIN ASPART 100 UNIT/ML ~~LOC~~ SOLN
0.0000 [IU] | Freq: Every day | SUBCUTANEOUS | Status: DC
  Administered 2020-05-27: 2 [IU] via SUBCUTANEOUS

## 2020-05-27 MED ORDER — INSULIN GLARGINE 100 UNIT/ML ~~LOC~~ SOLN
13.0000 [IU] | Freq: Every day | SUBCUTANEOUS | Status: DC
  Administered 2020-05-27: 13 [IU] via SUBCUTANEOUS
  Filled 2020-05-27 (×2): qty 0.13

## 2020-05-27 MED ORDER — ALUM & MAG HYDROXIDE-SIMETH 200-200-20 MG/5ML PO SUSP
30.0000 mL | Freq: Once | ORAL | Status: AC
  Administered 2020-05-27: 30 mL via ORAL
  Filled 2020-05-27: qty 30

## 2020-05-27 NOTE — ED Triage Notes (Signed)
Reports sore throat, cough, fever and SOB x2 days. Patient states she has been taking tylenol for fevers. She reports difficulty swallowing. States her mother is also sick with the same symptoms. Tested for COVID yesterday but hasn't gotten results back yet. Vaccinated w/ pfizer, no booster.

## 2020-05-27 NOTE — Progress Notes (Signed)
elink monitoring for sepsis protocol 

## 2020-05-27 NOTE — H&P (Addendum)
History and Physical    Kelli Hudson YSA:[id number] DOB: 1990/10/22 DOA: 05/27/2020  PCP: [name], MD   Patient coming from: Home  Chief Complaint: Home  HPI: Kelli Hudson is a 30 y.o. female with a past medical history significant for but not limited to diabetes mellitus type 1 with gastroparesis, hypertension, history of H. pylori infection, history of hyperthyroidism, history of GERD, history of gastroparesis as well as other comorbidities including amputation of the left fifth toe who presented for the evaluation of a flulike illness and endorsing a cough, sore throat, myalgias, fever, rhinorrhea, nasal congestion and sore throat.  Recently she was hospitalized for diabetic gastroparesis and had numerous episodes of nausea vomiting and at that time gastroenterology evaluated her and placed on IV erythromycin and Levbid.  She is finally been confirmed the diagnosis of diabetic gastroparesis after gastric emptying study was done in outpatient setting on 05/16/2020.   She again presented to the hospital today because of the above symptoms noted and she initially thought they were her allergies.  She reports a T-max of 103 Fahrenheit last 24 hours and has been taking her insulin as prescribed.  She had an episode of vomiting earlier this morning and noted that her emesis was clear and appeared to be does water as she does previously ingested.  She denies abdominal pain or nausea at this time and states that she is hungry.  She still feels very short of breath and has been coughing up some sputum greenish in color.  Yesterday she went to urgent care for for similar symptoms and had a Covid test, flu test and strep test.  Both the strep and the flu test were negative at that time and Covid at that time is pending.  She has received a Covid vaccine x2 but not the booster.  Recently reported that her last menstrual period was 2 weeks ago and reports her menstrual periods last for a  week and recently has been heavy.  No evidence of any GI bleeding noted.  [facility] was asked to admit this patient for her shortness of breath and cough and upon further evaluation she is found have a right upper lobe pneumonia.  ED Course: In the ED she had basic blood work done as well as a chest x-ray and EKG and she was given IV ceftriaxone and azithromycin and 1 L of lactated Ringer's bolus and placed on maintenance IV fluids with Ringer's at 150 MLS per hour  Review of Systems: As per HPI otherwise all other systems reviewed and negative.   Past Medical History:  Diagnosis Date  . Acute H. pylori gastric ulcer   . Diabetes mellitus ([place])   . Diabetic gastroparesis ([place])   . Gastroparesis   . GERD (gastroesophageal reflux disease)   . Hypertension   . Hyperthyroidism     Past Surgical History:  Procedure Laterality Date  . AMPUTATION TOE Left 03/10/2018   Procedure: AMPUTATION FIFTH TOE;  Surgeon: Trula Slade, DPM;  Location: [facility];  Service: Podiatry;  Laterality: Left;  . BIOPSY  01/28/2020   Procedure: BIOPSY;  Surgeon: Otis Brace, MD;  Location: [facility];  Service: Gastroenterology;;  . ESOPHAGOGASTRODUODENOSCOPY N/A 01/28/2020   Procedure: ESOPHAGOGASTRODUODENOSCOPY (EGD);  Surgeon: Otis Brace, MD;  Location: [facility];  Service: Gastroenterology;  Laterality: N/A;  . ESOPHAGOGASTRODUODENOSCOPY (EGD) WITH PROPOFOL Left 09/08/2015   Procedure: ESOPHAGOGASTRODUODENOSCOPY (EGD) WITH PROPOFOL;  Surgeon: Arta Silence, MD;  Location: [facility];  Service: Endoscopy;  Laterality:  Left;  . WISDOM TOOTH EXTRACTION     SOCIAL HISTORY   reports that she has never smoked. She has never used smokeless tobacco. She reports current alcohol use of about 1.0 standard drink of alcohol per week. She reports that she does not use drugs.  ALLERGIES No Known Allergies  Family History  Problem Relation Age of Onset  . Lung cancer Mother   .  Diabetes Mother   . Bipolar disorder Father   . Liver cancer Maternal Grandfather   . Breast cancer Other        maternal great aunt  . Heart disease Other   . Ovarian cancer Other        maternal great aunt  . Pancreatic cancer Maternal Uncle   . Prostate cancer Maternal Uncle   . Diabetes Maternal Aunt        x 2  . Kidney disease Other        maternal great aunt   Prior to Admission medications   Medication Sig Start Date End Date Taking? Authorizing Provider  atenolol (TENORMIN) 25 MG tablet Take 1 tablet (25 mg total) by mouth 2 (two) times daily. 01/30/20   Mariel Aloe, MD  Blood Glucose Monitoring Suppl (TRUE METRIX METER) w/Device KIT 1 Device by Does not apply route 4 (four) times daily. 07/05/19   Libby Maw, MD  famotidine (PEPCID) 20 MG tablet Take 1 tablet (20 mg total) by mouth 2 (two) times daily. 05/14/20 05/14/21  Raiford Noble Latif, DO  feeding supplement, ENSURE COMPLETE, (ENSURE COMPLETE) LIQD Take 237 mLs by mouth 2 (two) times daily between meals. 03/07/20   Dutch Quint B, FNP  glucose blood (TRUE METRIX BLOOD GLUCOSE TEST) test strip Use as instructed 07/05/19   Libby Maw, MD  hyoscyamine (LEVBID) 0.375 MG 12 hr tablet Take 1 tablet (0.375 mg total) by mouth every 12 (twelve) hours as needed for cramping (abdominal pain). 05/14/20   Raiford Noble Latif, DO  insulin degludec (TRESIBA FLEXTOUCH) 100 UNIT/ML FlexTouch Pen Inject 13 Units into the skin at bedtime.    [provider]  Insulin Pen Needle 31G X 5 MM MISC 1 Device by Does not apply route QID. For use with insulin pens 07/05/19   Libby Maw, MD  insulin regular (NOVOLIN R) 100 units/mL injection Inject 0-0.15 mLs (0-15 Units total) into the skin 3 (three) times daily before meals. CBG 70 - 120: 0 units CBG 121 - 150: 2 units CBG 151 - 200: 3 units CBG 201 - 250: 5 units CBG 251 - 300: 8 units CBG 301 - 350: 11 units CBG 351 - 400: 15 units Patient not taking:  Reported on 05/22/2020 05/14/20   Raiford Noble Latif, DO  lisinopril (ZESTRIL) 5 MG tablet Take 5 mg by mouth daily.  10/17/19   [provider]  metoCLOPramide (REGLAN) 5 MG tablet Take 1 tablet (5 mg total) by mouth 3 (three) times daily before meals. 05/21/20   Esterwood, Amy S, PA-C  Multiple Vitamin (MULTIVITAMIN WITH MINERALS) TABS tablet Take 1 tablet by mouth daily.    [provider]  ondansetron (ZOFRAN ODT) 4 MG disintegrating tablet Take 1 tablet (4 mg total) by mouth every 6 (six) hours as needed for nausea or vomiting. 04/20/20   Esterwood, Amy S, PA-C  pantoprazole (PROTONIX) 40 MG tablet Take 1 tablet (40 mg total) by mouth 2 (two) times daily. 05/14/20   Raiford Noble Latif, DO  promethazine (PHENERGAN) 12.5 MG  tablet Take 1 tablet (12.5 mg total) by mouth every 6 (six) hours as needed for nausea or vomiting. 05/14/20   Raiford Noble Fajardo, DO  TRUEPLUS LANCETS 28G MISC assist with checking blood sugar TID and qhs 09/10/15   Zettie Pho S, PA-C  insulin aspart (NOVOLOG) 100 UNIT/ML FlexPen Inject 5 Units into the skin 3 (three) times daily with meals. 02/23/18 07/05/19  Donne Hazel, MD    Physical Exam: Vitals:   05/27/20 1430 05/27/20 1500 05/27/20 1530 05/27/20 1600  BP: 99/66 105/73 109/67 (!) 85/73  Pulse: (!) 125 (!) 139 (!) 134 (!) 140  Resp: 18 18 18 16   Temp:      TempSrc:      SpO2: 99% 96% 100% 97%   Constitutional: WN/WD African-American female currently in mild distress appears slightly uncomfortable and slightly ill-appearing Eyes: Lids and conjunctivae normal, sclerae anicteric  ENMT: External Ears, Nose appear normal. Grossly normal hearing. Neck: Appears normal, supple, no cervical masses, normal ROM, no appreciable thyromegaly; no JVD Respiratory: Diminished to auscultation bilaterally with coarse breath sounds worse on the right compared to left in the upper lobes and has some mild rhonchi, no wheezing, rales, or crackles. Normal respiratory  effort and patient is not tachypenic. No accessory muscle use.  Not wearing supplemental oxygen via nasal cannula Cardiovascular: Tachycardic rate but regular rhythm, no murmurs / rubs / gallops. S1 and S2 auscultated. No extremity edema.  Abdomen: Soft, non-tender, mildly-distended.  Bowel sounds positive.  GU: Deferred. Musculoskeletal: No clubbing / cyanosis of digits/nails. No joint deformity upper and lower extremities but has had a toe amputated on the right fifth toe Skin: No rashes, lesions, ulcers on his skin evaluation. No induration; Warm and dry.  Neurologic: CN 2-12 grossly intact with no focal deficits. Romberg sign and cerebellar reflexes not assessed.  Psychiatric: Normal judgment and insight. Alert and oriented x 3.  Slightly anxious mood and appropriate affect.   Labs on Admission: I have personally reviewed following labs and imaging studies  CBC: Recent Labs  Lab 05/22/20 1417 05/27/20 1506  WBC 7.0 9.9  NEUTROABS  --  7.3  HGB 11.3* 8.5*  HCT 33.6* 27.6*  MCV 89.4 96.2  PLT 371.0 740   Basic Metabolic Panel: Recent Labs  Lab 05/22/20 1417 05/27/20 1506  NA 139  139 136  K 4.6  4.6 3.6  CL 101  101 107  CO2 32  32 17*  GLUCOSE 162*  162* 149*  BUN 25*  25* 13  CREATININE 0.95  0.95 0.69  CALCIUM 9.5  9.5 7.5*  MG 1.7  --   PHOS 5.1*  --    GFR: Estimated Creatinine Clearance: 105.6 mL/min (by C-G formula based on SCr of 0.69 mg/dL). Liver Function Tests: Recent Labs  Lab 05/22/20 1417 05/27/20 1506  AST 19 20  ALT 26 20  ALKPHOS 86 34*  BILITOT 0.5 0.3  PROT 6.4 4.1*  ALBUMIN 3.6 1.9*   No results for input(s): LIPASE, AMYLASE in the last 168 hours. No results for input(s): AMMONIA in the last 168 hours. Coagulation Profile: Recent Labs  Lab 05/27/20 1506  INR 1.1   Cardiac Enzymes: No results for input(s): CKTOTAL, CKMB, CKMBINDEX, TROPONINI in the last 168 hours. BNP (last 3 results) No results for input(s): PROBNP in the  last 8760 hours. HbA1C: No results for input(s): HGBA1C in the last 72 hours. CBG: Recent Labs  Lab 05/27/20 1430  GLUCAP 175*   Lipid Profile: No results  for input(s): CHOL, HDL, LDLCALC, TRIG, CHOLHDL, LDLDIRECT in the last 72 hours. Thyroid Function Tests: No results for input(s): TSH, T4TOTAL, FREET4, T3FREE, THYROIDAB in the last 72 hours. Anemia Panel: No results for input(s): VITAMINB12, FOLATE, FERRITIN, TIBC, IRON, RETICCTPCT in the last 72 hours. Urine analysis:    Component Value Date/Time   COLORURINE YELLOW 05/22/2020 [name] 05/22/2020 1417   LABSPEC 1.015 05/22/2020 1417   PHURINE 7.0 05/22/2020 1417   GLUCOSEU NEGATIVE 05/22/2020 1417   HGBUR NEGATIVE 05/22/2020 1417   BILIRUBINUR NEGATIVE 05/22/2020 1417   KETONESUR NEGATIVE 05/22/2020 1417   PROTEINUR 100 (A) 05/10/2020 1153   UROBILINOGEN 0.2 05/22/2020 1417   NITRITE NEGATIVE 05/22/2020 1417   LEUKOCYTESUR NEGATIVE 05/22/2020 1417   Sepsis Labs: !!!!!!!!!!!!!!!!!!!!!!!!!!!!!!!!!!!!!!!!!!!! @LABRCNTIP (procalcitonin:4,lacticidven:4) )No results found for this or any previous visit (from the past 240 hour(s)).   Radiological Exams on Admission: DG Chest Portable 1 View  Result Date: 05/27/2020 CLINICAL DATA:  Cough and fever EXAM: PORTABLE CHEST 1 VIEW COMPARISON:  February 27, 2020 FINDINGS: There is airspace opacity in the right upper lobe. Lungs elsewhere clear. Heart size and pulmonary vascular normal. No adenopathy. No bone lesions. IMPRESSION: Airspace opacity consistent with pneumonia right upper lobe. Lungs elsewhere clear. Heart size normal. Electronically Signed   By: Lowella Grip III M.D.   On: 05/27/2020 14:50    EKG: Independently reviewed.  Showed a sinus tachycardia at a rate of 126 with a QTC of 435 with no evidence of ST elevation on my interpretation  Assessment/Plan Active Problems:   Sepsis ([place])  Sepsis in the setting of RUL Pneumonia r/o COVID -Presented with  Concern for Sepsis and Fever of 103 at Home and took Analgesics with Acetaminophen prior to coming  -Complains of a sore throat, cough, congestion and fever and shortness of breath for the last few days -Temperature here was 99.9 -She was having a Sinus Tachycardia ranging from 114-140 on Admission; Now 126 and also has Tachypnea with a RR of 25 and a Source of Infection with PNA in the RUL -Check COVID and this is pending; she has been vaccinated for either vaccine but no booster -CXR showed "Airspace opacity consistent with pneumonia right upper lobe. Lungs elsewhere clear. Heart size normal." -VBG showed     Component Value Date/Time   HCO3 24.7 05/27/2020 1702   TCO2 19 01/02/2016 0700   ACIDBASEDEF 0.5 05/27/2020 1702   O2SAT 68.9 05/27/2020 1702  -Check Blood Cx x2 -Recently Discharged from the Hospital approximately 2 weeks ago for Gastroparesis -He was given azithromycin and IV ceftriaxone in the ED for community-acquired pneumonia -We will add Xopenex/Atrovent if Covid is negative as well as flutter valve, incentive spirometry and guaifenesin 1200 g p.o. twice daily -She is given albuterol 2 puff inhalations -Continue with empiric antibiotics with IV azithromycin IV ceftriaxone -Cultures x2 obtained in the ED and will need to follow -We will order a procalcitonin level as well as a lactic acid level -Repeat chest x-ray in a.m. and will need an ambulatory home O2 screen prior to discharge  Hx of Uncontrolled Type 2 Diabetes Mellitus with Diabetic Gastroparesis  -Pt is continued onbasal insulin and currently on 13 units subcu nightly which will be continued -Recently Checked hemoglobin A1c and was 11.5 -She had mild ketones in her urine -Patient with known history of gastroparesis and recent nuclear medicine gastric emptying study showed severely delayed gastric emptying -We will add a sensitive NovoLog sign scale insulin AC as well -  Continue To monitor CBGs per protocol and will  initiate carb modified diet  Nausea and Vomitingin the setting of Diabetic Gastroparesis as well as in the setting of infection -Vomited one time this morning -C/w Antiemetics with po/IV Ondansetron [id number] Nausea   Metabolic Acidosis -Patient's CO2 is 17, AG is 12, Chloride Level is 17 -Continue IVF hydration as above -Continue to Monitor and Trend and Repeat CMP in the AM  Tachycardia -Patient with Sinus Tachycardia secondary to pain and probable volume loss with persistent nausea and vomiting and Pneumonia  -Given 2 L of lactated Ringer bolus and then placed on maintenance IV fluids  -Continue to monitor in the progressive care unit -Continue to Monitor on Telemetry; Last Documented HR was90  Hypomagnesemia -Patient's Mag level was 1.7 on 05/22/20 -Check Mag Level today -Continue to Monitor and Replete as Necessary -Repeat Mag Level in the AM   Essential Hypertension -Home medications include Atenolol 25 mg po BID and Lisinopril 5 mg po Daily and resume now -We will add IV hydralazine 10 mg for systolic blood pressure greater than 650 or diastolic blood pressure greater than 100 -Continue to Monitor BP per protocol -Last BP was 178/89 AM   HLD -Recent Lipid Panel done and showed a total cholesterol/HDL ratio 3, cholesterol level 231, HDL 76.30, LDL 132, microalbumin/creatinine ratio 66.9, non-HDL 155.09, triglycerides 113.0, VLDL 22.0 -Dietary counseling given  Normocytic Anemia -Patient's Hgb/Hct went from 11.3/33.6 on 05/22/20 -> 8.5/27.6 and question if this is accurate to repeat -Recent Anemia Panel was checked and it showed an iron level of 99, U IBC of 359, TIBC of 458, saturation ratio of 22%, ferritin level 27, folate level 21.0, vitamin B12 547 -She does have a history of heavy menstruation -Continue to Monitor for S/Sx of Bleeding; No overt bleeding noted  -Repeat CBC in a.m.  DVT prophylaxis: Enoxaparin 40 mg subcu daily 24 Code Status: FULL CODE Family  Communication: No family present at bedside  Disposition Plan: Pending further clinical improvement back to baseline and anticipate discharging home next 48 hours Consults called: None Admission status: Inpatient progressive for now given her hypotension and tachycardia on admission  Status is: Inpatient  Remains inpatient appropriate because:Unsafe d/c plan, IV treatments appropriate due to intensity of illness or inability to take PO and Inpatient level of care appropriate due to severity of illness   Dispo: The patient is from: Home              Anticipated d/c is to: Home              Patient currently is not medically stable to d/c.   Difficult to place patient No   Severity of Illness: The appropriate patient status for this patient is INPATIENT. Inpatient status is judged to be reasonable and necessary in order to provide the required intensity of service to ensure the patient's safety. The patient's presenting symptoms, physical exam findings, and initial radiographic and laboratory data in the context of their chronic comorbidities is felt to place them at high risk for further clinical deterioration. Furthermore, it is not anticipated that the patient will be medically stable for discharge from the hospital within 2 midnights of admission. The following factors support the patient status of inpatient.   " The patient's presenting symptoms include SOB, Cough Congestion and Subjective Fever. " The worrisome physical exam findings include Diminished breath sounds and tachycardia. " The initial radiographic and laboratory data are worrisome because of Pneumonia and Metabolic Acidosis. "  The chronic co-morbidities are as above  * I certify that at the point of admission it is my clinical judgment that the patient will require inpatient hospital care spanning beyond 2 midnights from the point of admission due to high intensity of service, high risk for further deterioration and high  frequency of surveillance required.Kerney Elbe, D.O. [facility] PAGER is on [place]  If 7PM-7AM, please contact night-coverage [email or web address]  05/27/2020, 5:30 PM

## 2020-05-27 NOTE — ED Provider Notes (Signed)
[facility] DEPT Provider Note   CSN: [id number] Arrival date & time: 05/27/20  1241     History Chief Complaint  Patient presents with  . Fever    Kelli Hudson is a 30 y.o. female with a history of diabetes type 2, hypertension, GERD, hypothyroidism, diabetic gastroparesis.  Patient presents with chief complaint of flulike illness.  Patient reports that her symptoms began 2 days prior.  Patient endorses sore throat, productive cough, myalgias, fever, rhinorrhea, nasal congestion, sore throat.  Reports that head of symptoms have aggressively worsened.   Patient states T-max of 59 Fahrenheit in the last 24 hours.  Patient reports that she has been taking her insulin as prescribed.  Patient reports that her CBG earlier today was 200.  Endorses that she had one episode of vomiting earlier this morning.  Patient reports that her emesis was clear and appeared to be water she had just previously ingested.  Patient denies any abdominal pain or nausea at present.  Patient denies any trouble swallowing, high potato voice, drooling, chest pain, shortness of breath, abdnominal pain.  Per chart review patient was seen yesterday at urgent care where she had a Covid test, flu test, and strep test.  Strep test and flu test were negative.  Covid test is pending.  Patient reports that she has received COVID-19 vaccinations x2.  LMP 2 weeks prior.  Patient reports menstrual period lasted for 1 week.  Patient reports her menstrual periods are heavy.   HPI     Past Medical History:  Diagnosis Date  . Acute H. pylori gastric ulcer   . Diabetes mellitus ([place])   . Diabetic gastroparesis ([place])   . Gastroparesis   . GERD (gastroesophageal reflux disease)   . Hypertension   . Hyperthyroidism     Patient Active Problem List   Diagnosis Date Noted  . Borderline hyperlipidemia 05/23/2020  . Gastroparesis due to DM ([place]) 05/11/2020  . Uncontrolled type 2 diabetes  mellitus with gastroparesis ([place]) 05/09/2020  . Tachycardia 05/09/2020  . Normocytic anemia 04/20/2020  . Hyperosmolar hyperglycemic state (HHS) ([place]) 02/24/2020  . Intractable nausea and vomiting   . Intractable abdominal pain 01/24/2020  . H. pylori infection 10/07/2019  . Depression with anxiety 07/05/2019  . Diabetic foot ulcer ([place]) 02/21/2018  . Diabetic polyneuropathy associated with type 2 diabetes mellitus ([place]) 09/24/2017  . Pain in right foot 09/24/2017  . Diabetic ketoacidosis without coma associated with type 2 diabetes mellitus ([place]) 01/02/2016  . Gastroparesis   . Leukocytosis   . Diabetes mellitus due to underlying condition, uncontrolled, with hyperosmolarity without coma, without long-term current use of insulin ([place])   . Increased frequency of urination 06/14/2011  . UTI (urinary tract infection) 06/14/2011  . Goiter 10/05/2009  . Uncontrolled diabetes mellitus ([place]) 10/05/2009  . Essential hypertension 10/05/2009    Past Surgical History:  Procedure Laterality Date  . AMPUTATION TOE Left 03/10/2018   Procedure: AMPUTATION FIFTH TOE;  Surgeon: Trula Slade, DPM;  Location: [facility];  Service: Podiatry;  Laterality: Left;  . BIOPSY  01/28/2020   Procedure: BIOPSY;  Surgeon: Otis Brace, MD;  Location: [facility];  Service: Gastroenterology;;  . ESOPHAGOGASTRODUODENOSCOPY N/A 01/28/2020   Procedure: ESOPHAGOGASTRODUODENOSCOPY (EGD);  Surgeon: Otis Brace, MD;  Location: [facility];  Service: Gastroenterology;  Laterality: N/A;  . ESOPHAGOGASTRODUODENOSCOPY (EGD) WITH PROPOFOL Left 09/08/2015   Procedure: ESOPHAGOGASTRODUODENOSCOPY (EGD) WITH PROPOFOL;  Surgeon: Arta Silence, MD;  Location: [facility] ENDOSCOPY;  Service: Endoscopy;  Laterality:  Left;  . WISDOM TOOTH EXTRACTION       OB History    Gravida  0   Para  0   Term  0   Preterm  0   AB  0   Living  0     SAB  0   IAB  0   Ectopic  0   Multiple  0   Live  Births  0           Family History  Problem Relation Age of Onset  . Lung cancer Mother   . Diabetes Mother   . Bipolar disorder Father   . Liver cancer Maternal Grandfather   . Breast cancer Other        maternal great aunt  . Heart disease Other   . Ovarian cancer Other        maternal great aunt  . Pancreatic cancer Maternal Uncle   . Prostate cancer Maternal Uncle   . Diabetes Maternal Aunt        x 2  . Kidney disease Other        maternal great aunt    Social History   Tobacco Use  . Smoking status: Never Smoker  . Smokeless tobacco: Never Used  Vaping Use  . Vaping Use: Never used  Substance Use Topics  . Alcohol use: Yes    Alcohol/week: 1.0 standard drink    Types: 1 Shots of liquor per week    Comment: occasional   . Drug use: No    Home Medications Prior to Admission medications   Medication Sig Start Date End Date Taking? Authorizing Provider  atenolol (TENORMIN) 25 MG tablet Take 1 tablet (25 mg total) by mouth 2 (two) times daily. 01/30/20   Mariel Aloe, MD  Blood Glucose Monitoring Suppl (TRUE METRIX METER) w/Device KIT 1 Device by Does not apply route 4 (four) times daily. 07/05/19   Libby Maw, MD  famotidine (PEPCID) 20 MG tablet Take 1 tablet (20 mg total) by mouth 2 (two) times daily. 05/14/20 05/14/21  Raiford Noble Latif, DO  feeding supplement, ENSURE COMPLETE, (ENSURE COMPLETE) LIQD Take 237 mLs by mouth 2 (two) times daily between meals. 03/07/20   Dutch Quint B, FNP  glucose blood (TRUE METRIX BLOOD GLUCOSE TEST) test strip Use as instructed 07/05/19   Libby Maw, MD  hyoscyamine (LEVBID) 0.375 MG 12 hr tablet Take 1 tablet (0.375 mg total) by mouth every 12 (twelve) hours as needed for cramping (abdominal pain). 05/14/20   Raiford Noble Latif, DO  insulin degludec (TRESIBA FLEXTOUCH) 100 UNIT/ML FlexTouch Pen Inject 13 Units into the skin at bedtime.    [provider]  Insulin Pen Needle 31G X 5 MM MISC 1  Device by Does not apply route QID. For use with insulin pens 07/05/19   Libby Maw, MD  insulin regular (NOVOLIN R) 100 units/mL injection Inject 0-0.15 mLs (0-15 Units total) into the skin 3 (three) times daily before meals. CBG 70 - 120: 0 units CBG 121 - 150: 2 units CBG 151 - 200: 3 units CBG 201 - 250: 5 units CBG 251 - 300: 8 units CBG 301 - 350: 11 units CBG 351 - 400: 15 units Patient not taking: Reported on 05/22/2020 05/14/20   Raiford Noble Latif, DO  lisinopril (ZESTRIL) 5 MG tablet Take 5 mg by mouth daily.  10/17/19   [provider]  metoCLOPramide (REGLAN) 5 MG tablet Take 1 tablet (5 mg  total) by mouth 3 (three) times daily before meals. 05/21/20   Esterwood, Amy S, PA-C  Multiple Vitamin (MULTIVITAMIN WITH MINERALS) TABS tablet Take 1 tablet by mouth daily.    [provider]  ondansetron (ZOFRAN ODT) 4 MG disintegrating tablet Take 1 tablet (4 mg total) by mouth every 6 (six) hours as needed for nausea or vomiting. 04/20/20   Esterwood, Amy S, PA-C  pantoprazole (PROTONIX) 40 MG tablet Take 1 tablet (40 mg total) by mouth 2 (two) times daily. 05/14/20   Raiford Noble Latif, DO  promethazine (PHENERGAN) 12.5 MG tablet Take 1 tablet (12.5 mg total) by mouth every 6 (six) hours as needed for nausea or vomiting. 05/14/20   Raiford Noble [place], DO  TRUEPLUS LANCETS 28G MISC assist with checking blood sugar TID and qhs 09/10/15   Zettie Pho S, PA-C  insulin aspart (NOVOLOG) 100 UNIT/ML FlexPen Inject 5 Units into the skin 3 (three) times daily with meals. 02/23/18 07/05/19  Donne Hazel, MD    Allergies    Patient has no known allergies.  Review of Systems   Review of Systems  Constitutional: Positive for appetite change (decreased), chills, fatigue and fever.  HENT: Positive for congestion, postnasal drip, rhinorrhea and sore throat. Negative for drooling, trouble swallowing and voice change.   Eyes: Negative for visual disturbance.  Respiratory:  Positive for cough. Negative for shortness of breath.   Cardiovascular: Negative for chest pain, palpitations and leg swelling.  Gastrointestinal: Positive for nausea and vomiting. Negative for abdominal pain, constipation and diarrhea.  Genitourinary: Negative for difficulty urinating, dysuria, frequency and hematuria.  Musculoskeletal: Negative for back pain and neck pain.  Skin: Negative for color change and rash.  Neurological: Negative for dizziness, syncope, light-headedness and headaches.  Psychiatric/Behavioral: Negative for confusion.    Physical Exam Updated Vital Signs BP 100/71   Pulse (!) 114   Temp 99.9 F (37.7 C) (Oral)   Resp 16   LMP 04/29/2020 Comment: neg upt  SpO2 98%   Physical Exam Vitals and nursing note reviewed.  Constitutional:      General: She is not in acute distress.    Appearance: She is ill-appearing. She is not toxic-appearing or diaphoretic.  HENT:     Head: Normocephalic and atraumatic. No raccoon eyes, abrasion, contusion, masses, right periorbital erythema, left periorbital erythema or laceration.     Jaw: No trismus or pain on movement.     Mouth/Throat:     Tongue: No lesions. Tongue does not deviate from midline.     Palate: No mass and lesions.     Pharynx: Oropharynx is clear. Uvula midline. Posterior oropharyngeal erythema present. No pharyngeal swelling, oropharyngeal exudate or uvula swelling.     Tonsils: No tonsillar exudate or tonsillar abscesses.  Eyes:     General: No scleral icterus.       Right eye: No discharge.        Left eye: No discharge.  Cardiovascular:     Rate and Rhythm: Tachycardia present.     Comments: Tachy at rate of 114 Pulmonary:     Effort: Pulmonary effort is normal. No tachypnea or respiratory distress.     Breath sounds: No stridor. Examination of the right-upper field reveals wheezing. Examination of the left-upper field reveals wheezing. Examination of the right-middle field reveals wheezing.  Examination of the left-middle field reveals wheezing. Examination of the right-lower field reveals wheezing. Examination of the left-lower field reveals wheezing. Wheezing present.  Abdominal:  General: There is no distension. There are no signs of injury.     Palpations: Abdomen is soft.     Tenderness: There is no abdominal tenderness. There is no guarding or rebound.  Musculoskeletal:     Cervical back: Neck supple.     Right lower leg: Normal.     Left lower leg: Normal.  Skin:    General: Skin is warm and dry.     Coloration: Skin is not jaundiced or pale.  Neurological:     General: No focal deficit present.     Mental Status: She is alert.     GCS: GCS eye subscore is 4. GCS verbal subscore is 5. GCS motor subscore is 6.  Psychiatric:        Behavior: Behavior is cooperative.     ED Results / Procedures / Treatments   Labs (all labs ordered are listed, but only abnormal results are displayed) Labs Reviewed  COMPREHENSIVE METABOLIC PANEL - Abnormal; Notable for the following components:      Result Value   CO2 17 (*)    Glucose, Bld 149 (*)    Calcium 7.5 (*)    Total Protein 4.1 (*)    Albumin 1.9 (*)    Alkaline Phosphatase 34 (*)    All other components within normal limits  CBC WITH DIFFERENTIAL/PLATELET - Abnormal; Notable for the following components:   RBC 2.87 (*)    Hemoglobin 8.5 (*)    HCT 27.6 (*)    All other components within normal limits  CBG MONITORING, ED - Abnormal; Notable for the following components:   Glucose-Capillary 175 (*)    All other components within normal limits  I-STAT BETA HCG BLOOD, ED ([facility], [facility], [facility] ONLY) - Abnormal; Notable for the following components:   I-stat hCG, quantitative 6.7 (*)    All other components within normal limits  RESP PANEL BY RT-PCR (FLU A&B, COVID) ARPGX2  CULTURE, BLOOD (ROUTINE X 2)  CULTURE, BLOOD (ROUTINE X 2)  URINE CULTURE  PROTIME-INR  APTT  LACTIC ACID, PLASMA  LACTIC ACID, PLASMA   URINALYSIS, ROUTINE W REFLEX MICROSCOPIC  BETA-HYDROXYBUTYRIC ACID  BLOOD GAS, VENOUS  PREGNANCY, URINE  TYPE AND SCREEN    EKG None  Radiology DG Chest Portable 1 View  Result Date: 05/27/2020 CLINICAL DATA:  Cough and fever EXAM: PORTABLE CHEST 1 VIEW COMPARISON:  February 27, 2020 FINDINGS: There is airspace opacity in the right upper lobe. Lungs elsewhere clear. Heart size and pulmonary vascular normal. No adenopathy. No bone lesions. IMPRESSION: Airspace opacity consistent with pneumonia right upper lobe. Lungs elsewhere clear. Heart size normal. Electronically Signed   By: Lowella Grip III M.D.   On: 05/27/2020 14:50    Procedures .Critical Care Performed by: Loni Beckwith, PA-C Authorized by: Loni Beckwith, PA-C   Critical care provider statement:    Critical care time (minutes):  45   Critical care was necessary to treat or prevent imminent or life-threatening deterioration of the following conditions:  Sepsis   Critical care was time spent personally by me on the following activities:  Evaluation of patient's response to treatment, examination of patient, ordering and performing treatments and interventions, ordering and review of laboratory studies, ordering and review of radiographic studies, pulse oximetry, re-evaluation of patient's condition, obtaining history from patient or surrogate, review of old charts and discussions with consultants   Care discussed with: admitting provider       Medications Ordered in ED Medications  lactated  ringers infusion ( Intravenous New Bag/Given 05/27/20 1554)  cefTRIAXone (ROCEPHIN) 2 g in sodium chloride 0.9 % 100 mL IVPB (2 g Intravenous Bolus from Bag 05/27/20 1535)  azithromycin (ZITHROMAX) 500 mg in sodium chloride 0.9 % 250 mL IVPB (has no administration in time range)  alum & mag hydroxide-simeth (MAALOX/MYLANTA) 200-200-20 MG/5ML suspension 30 mL (30 mLs Oral Given 05/27/20 1415)    And  lidocaine (XYLOCAINE)  2 % viscous mouth solution 15 mL (15 mLs Oral Given 05/27/20 1415)  albuterol (VENTOLIN HFA) 108 (90 Base) MCG/ACT inhaler 2 puff (2 puffs Inhalation Given 05/27/20 1419)  aerochamber Z-Stat Plus/medium 1 each (1 each Other Given 05/27/20 1419)  lactated ringers bolus 1,000 mL (0 mLs Intravenous Stopped 05/27/20 1622)    ED Course  I have reviewed the triage vital signs and the nursing notes.  Pertinent labs & imaging results that were available during my care of the patient were reviewed by me and considered in my medical decision making (see chart for details).    MDM Rules/Calculators/A&P                          Ill appearing 30 year old female no acute distress, nontoxic-appearing.  Patient presents with chief complaint of flulike illness.  Patient reports that her symptoms began 2 days prior.  Per chart review patient was seen at urgent care yesterday where she had negative strep test, negative strep test, and Covid test is pending.  Patient endorses fever with T-max of 103 Fahrenheit in the last 24 hours, productive cough, sore throat.  Able to handle her oral secretions without difficulty.  She denies any drooling, hot potato voice, difficulty swallowing.    No pain with passive range of motion of neck.  She was able to handle GI cocktail without difficulty.Patient has no signs of peritonsillar abscess or Ludwig angina.  Patient able to speak in full pretenses without difficulty.  Patient oxygen saturation 98% on room air.  Patient has wheezing noted to all lung fields.  Will obtain chest x-ray.  Patient given albuterol inhaler.  Patient has a history of diabetes will assess her CBG.  CBG175  On repeat examination patient noted to have increased tachycardia. Chest x-ray shows airspace opacity consistent with pneumonia to right upper lobe.  Due to patient's tachycardia, tachypnea, and signs of pneumonia on chest x-ray will initiate ED sepsis protocol.  We will also obtain VBG and beta  hydroxybutyric acid to evaluate for possibility of DKA.  Patient started on azithromycin and ceftriaxone.   CBC shows no leukocytosis.  Hemoglobin 8.5 and hematocrit 27.6.  Patient has no obvious source of bleeding.  Patient LMP was 2 weeks prior.  Patient reports menstrual period lasted for 1 week and described as heavy.  Will obtain type and screen.  CMP shows bicarb decreased at 17, anion gap within normal limits, glucose 149, potassium within normal limits.  Low suspicion for DKA at this time.  Will contact hospitalist for admission. 1725 spoke to hospitalist Dr. Alfredia Ferguson will see the patient for admission.  Patient was discussed with and evaluated by Dr. Zenia Resides.   Final Clinical Impression(s) / ED Diagnoses Final diagnoses:  SOB (shortness of breath)    Rx / DC Orders ED Discharge Orders    None       Dyann Ruddle 05/28/20 0036    Lacretia Leigh, MD 05/31/20 [phone]

## 2020-05-27 NOTE — ED Provider Notes (Signed)
I provided a substantive portion of the care of this patient.  I personally performed the entirety of the medical decision making for this encounter.    30 year old female who presented with cough congestion x2 days.  Has evidence of pneumonia on chest x-ray.  She is tachycardic here.  Mildly hyperglycemic without evidence of DKA.  Will be given IV fluid boluses started on IV antibiotics and admitted to the hospital   Lorre Nick, MD 05/27/20 1559

## 2020-05-28 ENCOUNTER — Inpatient Hospital Stay (HOSPITAL_COMMUNITY): Payer: 59

## 2020-05-28 ENCOUNTER — Telehealth: Payer: 59

## 2020-05-28 DIAGNOSIS — U071 COVID-19: Secondary | ICD-10-CM

## 2020-05-28 LAB — COMPREHENSIVE METABOLIC PANEL
ALT: 27 U/L (ref 0–44)
AST: 31 U/L (ref 15–41)
Albumin: 2.4 g/dL — ABNORMAL LOW (ref 3.5–5.0)
Alkaline Phosphatase: 50 U/L (ref 38–126)
Anion gap: 8 (ref 5–15)
BUN: 13 mg/dL (ref 6–20)
CO2: 23 mmol/L (ref 22–32)
Calcium: 8.4 mg/dL — ABNORMAL LOW (ref 8.9–10.3)
Chloride: 104 mmol/L (ref 98–111)
Creatinine, Ser: 0.83 mg/dL (ref 0.44–1.00)
GFR, Estimated: >60 mL/min (ref >60)
Glucose, Bld: 242 mg/dL — ABNORMAL HIGH (ref 70–99)
Potassium: 4 mmol/L (ref 3.5–5.1)
Sodium: 135 mmol/L (ref 135–145)
Total Bilirubin: 0.6 mg/dL (ref 0.3–1.2)
Total Protein: 5.7 g/dL — ABNORMAL LOW (ref 6.5–8.1)

## 2020-05-28 LAB — URINALYSIS, ROUTINE W REFLEX MICROSCOPIC
Bilirubin Urine: NEGATIVE
Glucose, UA: >=500 mg/dL — AB
Hgb urine dipstick: NEGATIVE
Ketones, ur: 20 mg/dL — AB
Leukocytes,Ua: NEGATIVE
Nitrite: NEGATIVE
Protein, ur: 100 mg/dL — AB
Specific Gravity, Urine: 1.011 (ref 1.005–1.030)
pH: 6 (ref 5.0–8.0)

## 2020-05-28 LAB — CBC
HCT: 28 % — ABNORMAL LOW (ref 36.0–46.0)
Hemoglobin: 9 g/dL — ABNORMAL LOW (ref 12.0–15.0)
MCH: 30.2 pg (ref 26.0–34.0)
MCHC: 32.1 g/dL (ref 30.0–36.0)
MCV: 94 fL (ref 80.0–100.0)
Platelets: 265 10*3/uL (ref 150–400)
RBC: 2.98 MIL/uL — ABNORMAL LOW (ref 3.87–5.11)
RDW: 13.2 % (ref 11.5–15.5)
WBC: 9.3 10*3/uL (ref 4.0–10.5)
nRBC: 0 % (ref 0.0–0.2)

## 2020-05-28 LAB — GLUCOSE, CAPILLARY
Glucose-Capillary: 225 mg/dL — ABNORMAL HIGH (ref 70–99)
Glucose-Capillary: 198 mg/dL — ABNORMAL HIGH (ref 70–99)

## 2020-05-28 LAB — PREGNANCY, URINE: Preg Test, Ur: NEGATIVE

## 2020-05-28 LAB — PROCALCITONIN: Procalcitonin: 0.18 ng/mL

## 2020-05-28 LAB — STREP PNEUMONIAE URINARY ANTIGEN: Strep Pneumo Urinary Antigen: NEGATIVE

## 2020-05-28 MED ORDER — AZITHROMYCIN 250 MG PO TABS: ORAL_TABLET | ORAL | 0 refills | Status: AC

## 2020-05-28 MED ORDER — LEVALBUTEROL TARTRATE 45 MCG/ACT IN AERO: 2.0000 | INHALATION_SPRAY | Freq: Four times a day (QID) | RESPIRATORY_TRACT | 0 refills | Status: DC | PRN

## 2020-05-28 NOTE — TOC Progression Note (Signed)
Transition of Care [facility]) - Progression Note    Patient Details  Name: Kelli Hudson MRN: [id number] Date of Birth: [birth year]  [facility] CM/SW Contact  Geni Bers, RN Phone Number: 05/28/2020, 2:46 PM  Clinical Narrative:     Pt from home with parents and plan to return home with no needs.   Expected Discharge Plan: Home/Self Care Barriers to Discharge: No Barriers Identified  Expected Discharge Plan and Services Expected Discharge Plan: Home/Self Care       Living arrangements for the past 2 months: Single Family Home                                       Social Determinants of Health (SDOH) Interventions    Readmission Risk Interventions No flowsheet data found.

## 2020-05-28 NOTE — Discharge Summary (Signed)
Physician Discharge Summary  Kelli Hudson TIW:[id number] DOB: Apr 29, 1990 DOA: 05/27/2020  PCP: [name], MD  Admit date: 05/27/2020 Discharge date: 05/28/2020  Admitted From: Home Disposition: Home  Recommendations for Outpatient Follow-up:  1. Follow up with PCP in 1-2 weeks 2. Please obtain BMP/CBC in one week  Discharge Condition: Stable CODE STATUS: Full Diet recommendation: As tolerated  Brief/Interim Summary: Kelli Hudson a 30 y.o.femalewith a past medical history significant for diabetes mellitus type 1 with profound gastroparesis, hypertension, history of H. pylori infection, history of hyperthyroidism, history of GERD who presented for the evaluation ofa flulike illness and endorsing a cough, sore throat, myalgias, fever, rhinorrhea, nasal congestion and sore throat. Recently hospitalized for diabetic gastroparesis with numerous episodes of nausea vomiting improving with erythromycin and Levbid. Diabetic gastroparesis was confirmed after gastric emptying study was done in outpatient setting on 05/16/2020. Presents with fevers and an episode of watery emesis with questionably productive cough. Patient recently presented to urgent care for for similar symptoms and had a Covid test, flu test and strep test. Both the strep and the flu test were negative at that time and Covid at that time is pending - subsequently positive on our POC testing. She has received a Covid vaccine x2 but not the booster. Imaging: CXR - questionable right upper lobe pneumonia without hypoxia.  Patient admitted as above with flulike symptoms noted to have Covid positive swab.  She does not have hypoxia, was transiently febrile at intake without recurrent fever, and her leukocytosis has resolved, her chest x-ray shows questionable tiny right upper lobe opacification but is not consistent with Covid pneumonia -would be more consistent with aspiration pneumonitis which would be  reasonable diagnosis given her recent episodes of gastroparesis with nausea vomiting.  Patient had received 2 doses of Remdesivir, but does not meet criteria for further inpatient treatment or steroids at this point given her lack of hypoxia.  She is tolerating p.o. well, otherwise denies nausea vomiting headache fevers chills, she does complain of sore throat which has been well controlled with oral lozenges and throat spray.  At this point patient would just need supportive care at home with increased fluids and close follow-up with PCP in the next 1 to 2 weeks -will discharge with Z-Pak to cover questionable community-acquired upper lobe infection on the right versus possible pneumonitis.  Discharge Diagnoses:  Active Problems:   Sepsis [facility])    Discharge Instructions  Discharge Instructions    Call MD for:  difficulty breathing, headache or visual disturbances   Complete by: As directed    Diet - low sodium heart healthy   Complete by: As directed    Increase activity slowly   Complete by: As directed      Allergies as of 05/28/2020   No Known Allergies     Medication List    TAKE these medications   atenolol 25 MG tablet Commonly known as: TENORMIN Take 1 tablet (25 mg total) by mouth 2 (two) times daily.   azithromycin 250 MG tablet Commonly known as: Zithromax Z-Pak Take 2 tablets (500 mg) on  Day 1,  followed by 1 tablet (250 mg) once daily on Days 2 through 5.   benzonatate 200 MG capsule Commonly known as: TESSALON Take 200 mg by mouth 3 (three) times daily as needed for cough.   famotidine 20 MG tablet Commonly known as: PEPCID Take 1 tablet (20 mg total) by mouth 2 (two) times daily.   feeding supplement (ENSURE COMPLETE) Liqd Take  237 mLs by mouth 2 (two) times daily between meals.   hyoscyamine 0.375 MG 12 hr tablet Commonly known as: LEVBID Take 1 tablet (0.375 mg total) by mouth every 12 (twelve) hours as needed for cramping (abdominal pain).   Insulin  Pen Needle 31G X 5 MM Misc 1 Device by Does not apply route QID. For use with insulin pens   insulin regular 100 units/mL injection Commonly known as: NovoLIN R Inject 0-0.15 mLs (0-15 Units total) into the skin 3 (three) times daily before meals. CBG 70 - 120: 0 units CBG 121 - 150: 2 units CBG 151 - 200: 3 units CBG 201 - 250: 5 units CBG 251 - 300: 8 units CBG 301 - 350: 11 units CBG 351 - 400: 15 units   ipratropium 0.03 % nasal spray Commonly known as: ATROVENT Place 2 sprays into both nostrils 2 (two) times daily as needed for rhinitis.   levalbuterol 45 MCG/ACT inhaler Commonly known as: XOPENEX HFA Inhale 2 puffs into the lungs every 6 (six) hours as needed for wheezing or shortness of breath.   lisinopril 5 MG tablet Commonly known as: ZESTRIL Take 5 mg by mouth daily.   metoCLOPramide 5 MG tablet Commonly known as: Reglan Take 1 tablet (5 mg total) by mouth 3 (three) times daily before meals.   multivitamin with minerals Tabs tablet Take 1 tablet by mouth daily.   ondansetron 4 MG disintegrating tablet Commonly known as: Zofran ODT Take 1 tablet (4 mg total) by mouth every 6 (six) hours as needed for nausea or vomiting.   pantoprazole 40 MG tablet Commonly known as: PROTONIX Take 1 tablet (40 mg total) by mouth 2 (two) times daily.   promethazine 12.5 MG tablet Commonly known as: PHENERGAN Take 1 tablet (12.5 mg total) by mouth every 6 (six) hours as needed for nausea or vomiting.   Tyler Aas FlexTouch 100 UNIT/ML FlexTouch Pen Generic drug: insulin degludec Inject 13 Units into the skin at bedtime.   True Metrix Blood Glucose Test test strip Generic drug: glucose blood Use as instructed   True Metrix Meter w/Device Kit 1 Device by Does not apply route 4 (four) times daily.   TRUEplus Lancets 28G Misc assist with checking blood sugar TID and qhs       No Known Allergies  Consultations:  None   Procedures/Studies: NM Gastric  Emptying  Result Date: 05/16/2020 CLINICAL DATA:  Nausea and vomiting EXAM: NUCLEAR MEDICINE GASTRIC EMPTYING SCAN TECHNIQUE: After oral ingestion of radiolabeled meal, sequential abdominal images were obtained for 4 hours. Percentage of activity emptying the stomach was calculated at 1 hour, 2 hour, 3 hour, and 4 hours. RADIOPHARMACEUTICALS:  2.1 mCi Tc-56msulfur colloid in standardized meal COMPARISON:  None. FINDINGS: Expected location of the stomach in the left upper quadrant. Ingested meal empties the stomach gradually over the course of the study. 0% emptied at 1 hr ( normal >= 10%) 6% emptied at 2 hr ( normal >= 40%) 6% emptied at 3 hr ( normal >= 70%) 6% emptied at 4 hr ( normal >= 90%) IMPRESSION: Severely delayed gastric emptying study. Electronically Signed   By: MInez CatalinaM.D.   On: 05/16/2020 19:38   CT ABDOMEN PELVIS W CONTRAST  Result Date: 05/09/2020 CLINICAL DATA:  Abdominal pain since this morning. EXAM: CT ABDOMEN AND PELVIS WITH CONTRAST TECHNIQUE: Multidetector CT imaging of the abdomen and pelvis was performed using the standard protocol following bolus administration of intravenous contrast. CONTRAST:  1071mOMNIPAQUE  IOHEXOL 300 MG/ML  SOLN COMPARISON:  01/24/2020 FINDINGS: Lower chest: Insert lung bases Hepatobiliary: No hepatic lesions or intrahepatic biliary dilatation. The gallbladder appears normal. No common bile duct dilatation. Pancreas: No mass, inflammation or ductal dilatation. Spleen: Normal size.  No focal lesions. Adrenals/Urinary Tract: Adrenal glands and kidneys are unremarkable. No renal mass, renal calculi or findings for pyelonephritis. There is fairly marked distention of the bladder almost up to the umbilicus. Bladder volume is estimated at 1.1 L. no bladder mass, bladder calculi or asymmetric bladder wall thickening. Stomach/Bowel: The stomach, duodenum, small bowel and colon are grossly normal. No acute inflammatory changes, mass lesions or obstructive  findings. The terminal ileum is normal. The appendix is normal. Vascular/Lymphatic: The aorta is normal in caliber. No dissection. The branch vessels are patent. The major venous structures are patent. No mesenteric or retroperitoneal mass or adenopathy. Small scattered lymph nodes are noted. Reproductive: The uterus and ovaries are unremarkable. Other: No pelvic mass or adenopathy. No free pelvic fluid collections. No inguinal mass or adenopathy. No abdominal wall hernia or subcutaneous lesions. Musculoskeletal: No significant bony findings. IMPRESSION: 1. No acute abdominal/pelvic findings, mass lesions or adenopathy. 2. Marked distention of the bladder almost up to the umbilicus. Bladder volume is estimated at 1.1 L. Electronically Signed   By: Marijo Sanes M.D.   On: 05/09/2020 21:57   DG Chest Port 1 View  Result Date: 05/28/2020 CLINICAL DATA:  COVID-19 pneumonitis EXAM: PORTABLE CHEST 1 VIEW COMPARISON:  May 27, 2020 FINDINGS: Ill-defined airspace opacity is again noted in the right upper lobe. The lungs elsewhere remain clear. Heart size and pulmonary vascularity are normal. No adenopathy. No bone lesions. IMPRESSION: Persistent airspace opacity consistent with pneumonia right upper lobe. Lungs elsewhere clear. Cardiac silhouette within normal limits. No adenopathy appreciable. Electronically Signed   By: Lowella Grip III M.D.   On: 05/28/2020 10:27   DG Chest Portable 1 View  Result Date: 05/27/2020 CLINICAL DATA:  Cough and fever EXAM: PORTABLE CHEST 1 VIEW COMPARISON:  February 27, 2020 FINDINGS: There is airspace opacity in the right upper lobe. Lungs elsewhere clear. Heart size and pulmonary vascular normal. No adenopathy. No bone lesions. IMPRESSION: Airspace opacity consistent with pneumonia right upper lobe. Lungs elsewhere clear. Heart size normal. Electronically Signed   By: Lowella Grip III M.D.   On: 05/27/2020 14:50   DG Abdomen Acute W/Chest  Result Date:  05/09/2020 CLINICAL DATA:  Nausea vomiting abdominal pain EXAM: DG ABDOMEN ACUTE WITH 1 VIEW CHEST COMPARISON:  02/25/2020 FINDINGS: There is no evidence of dilated bowel loops or free intraperitoneal air. No radiopaque calculi or other significant radiographic abnormality is seen. Heart size and mediastinal contours are within normal limits. Both lungs are clear. IMPRESSION: Negative abdominal radiographs.  No acute cardiopulmonary disease. Electronically Signed   By: Franchot Gallo M.D.   On: 05/09/2020 20:28   US Abdomen Limited RUQ (LIVER/GB)  Result Date: 05/11/2020 CLINICAL DATA:  Abdominal pain EXAM: ULTRASOUND ABDOMEN LIMITED RIGHT UPPER QUADRANT COMPARISON:  None. FINDINGS: Gallbladder: No gallstones or wall thickening visualized. No sonographic Murphy sign noted by sonographer. Common bile duct: Diameter: 2 mm Liver: No focal lesion identified. Within normal limits in parenchymal echogenicity. Portal vein is patent on color Doppler imaging with normal direction of blood flow towards the liver. Other: None. IMPRESSION: No acute abnormality noted. Electronically Signed   By: Inez Catalina M.D.   On: 05/11/2020 20:43     Subjective: No acute issues or events overnight, still  complaining of sore throat but denies nausea vomiting diarrhea constipation headache fevers chills shortness of breath dyspnea with exertion, or chest pain.   Discharge Exam: Vitals:   05/28/20 0635 05/28/20 1347  BP: (!) 138/94 105/69  Pulse: (!) 104 97  Resp:  16  Temp: 99 F (37.2 C) 99.3 F (37.4 C)  SpO2: 98% 100%   Vitals:   05/28/20 0240 05/28/20 0243 05/28/20 0635 05/28/20 1347  BP: 120/83  (!) 138/94 105/69  Pulse: 100  (!) 104 97  Resp: 18   16  Temp: 98.1 F (36.7 C)  99 F (37.2 C) 99.3 F (37.4 C)  TempSrc: Oral  Oral Oral  SpO2: (!) 87%  98% 100%  Weight:  77.2 kg    Height:  5' 8"  (1.727 m)      General: Pt is alert, awake, not in acute distress Cardiovascular: RRR, S1/S2 +, no rubs, no  gallops Respiratory: CTA bilaterally, no wheezing, no rhonchi Abdominal: Soft, NT, ND, bowel sounds + Extremities: no edema, no cyanosis    The results of significant diagnostics from this hospitalization (including imaging, microbiology, ancillary and laboratory) are listed below for reference.     Microbiology: Recent Results (from the past 240 hour(s))  Resp Panel by RT-PCR (Flu A&B, Covid) Nasopharyngeal Swab     Status: Abnormal   Collection Time: 05/27/20  3:06 PM   Specimen: Nasopharyngeal Swab; Nasopharyngeal(NP) swabs in vial transport medium  Result Value Ref Range Status   SARS Coronavirus 2 by RT PCR POSITIVE (A) NEGATIVE Final    Comment: CRITICAL RESULT CALLED TO, READ BACK BY AND VERIFIED WITH: HEATHER BULWIN RN 05/27/20 @1926  BY P.HENDERSON    Influenza A by PCR NEGATIVE NEGATIVE Final   Influenza B by PCR NEGATIVE NEGATIVE Final    Comment: (NOTE) The Xpert Xpress SARS-CoV-2/FLU/RSV plus assay is intended as an aid in the diagnosis of influenza from Nasopharyngeal swab specimens and should not be used as a sole basis for treatment. Nasal washings and aspirates are unacceptable for Xpert Xpress SARS-CoV-2/FLU/RSV testing.  Fact Sheet for Patients: EntrepreneurPulse.com.au  Fact Sheet for Healthcare Providers: IncredibleEmployment.be  This test is not yet approved or cleared by the [place] FDA and has been authorized for detection and/or diagnosis of SARS-CoV-2 by FDA under an Emergency Use Authorization (EUA). This EUA will remain in effect (meaning this test can be used) for the duration of the COVID-19 declaration under Section 564(b)(1) of the Act, 21 U.S.C. section 360bbb-3(b)(1), unless the authorization is terminated or revoked.  Performed at [facility], [place] [address] [place], [place] [address]   Blood Culture (routine x 2)     Status: None (Preliminary result)   Collection Time:  05/27/20  3:11 PM   Specimen: BLOOD RIGHT HAND  Result Value Ref Range Status   Specimen Description   Final    BLOOD RIGHT HAND Performed at [facility] [address] [place], [place] [address]    Special Requests   Final    BOTTLES DRAWN AEROBIC AND ANAEROBIC Blood Culture adequate volume Performed at [facility] [address] [place], [place] [address]    Culture   Final    NO GROWTH < 12 HOURS Performed at [facility] [address], [place], [place] [address]    Report Status PENDING  Incomplete  Blood Culture (routine x 2)     Status: None (Preliminary result)   Collection Time: 05/27/20  3:59 PM  Specimen: BLOOD RIGHT WRIST  Result Value Ref Range Status   Specimen Description   Final    BLOOD RIGHT WRIST Performed at [facility] [address], [place], [place] [address]    Special Requests   Final    BOTTLES DRAWN AEROBIC AND ANAEROBIC Blood Culture adequate volume Performed at [facility] [address] [place], [place] [address]    Culture   Final    NO GROWTH < 12 HOURS Performed at [facility] [address], [place], [place] [address]    Report Status PENDING  Incomplete     Labs: BNP (last 3 results) No results for input(s): BNP in the last 8760 hours. Basic Metabolic Panel: Recent Labs  Lab 05/22/20 1417 05/27/20 1506 05/28/20 0411  NA 139  139 136 135  K 4.6  4.6 3.6 4.0  CL 101  101 107 104  CO2 32  32 17* 23  GLUCOSE 162*  162* 149* 242*  BUN 25*  25* 13 13  CREATININE 0.95  0.95 0.69 0.83  CALCIUM 9.5  9.5 7.5* 8.4*  MG 1.7  --   --   PHOS 5.1*  --   --    Liver Function Tests: Recent Labs  Lab 05/22/20 1417 05/27/20 1506 05/28/20 0411  AST 19 20 31   ALT 26 20 27   ALKPHOS 86 34* 50  BILITOT 0.5 0.3 0.6  PROT 6.4 4.1* 5.7*  ALBUMIN 3.6 1.9* 2.4*   No results for input(s): LIPASE, AMYLASE in the last 168 hours. No  results for input(s): AMMONIA in the last 168 hours. CBC: Recent Labs  Lab 05/22/20 1417 05/27/20 1506 05/27/20 1938 05/28/20 0411  WBC 7.0 9.9 10.7* 9.3  NEUTROABS  --  7.3  --   --   HGB 11.3* 8.5* 10.4* 9.0*  HCT 33.6* 27.6* 32.4* 28.0*  MCV 89.4 96.2 93.4 94.0  PLT 371.0 248 272 265   Cardiac Enzymes: No results for input(s): CKTOTAL, CKMB, CKMBINDEX, TROPONINI in the last 168 hours. BNP: Invalid input(s): POCBNP CBG: Recent Labs  Lab 05/27/20 1430 05/27/20 2120 05/28/20 0814 05/28/20 1147  GLUCAP 175* 220* 225* 198*   D-Dimer No results for input(s): DDIMER in the last 72 hours. Hgb A1c No results for input(s): HGBA1C in the last 72 hours. Lipid Profile No results for input(s): CHOL, HDL, LDLCALC, TRIG, CHOLHDL, LDLDIRECT in the last 72 hours. Thyroid function studies No results for input(s): TSH, T4TOTAL, T3FREE, THYROIDAB in the last 72 hours.  Invalid input(s): FREET3 Anemia work up No results for input(s): VITAMINB12, FOLATE, FERRITIN, TIBC, IRON, RETICCTPCT in the last 72 hours. Urinalysis    Component Value Date/Time   COLORURINE YELLOW 05/27/2020 2319   APPEARANCEUR HAZY (A) 05/27/2020 2319   LABSPEC 1.011 05/27/2020 2319   PHURINE 6.0 05/27/2020 2319   GLUCOSEU >=500 (A) 05/27/2020 2319   GLUCOSEU NEGATIVE 05/22/2020 1417   HGBUR NEGATIVE 05/27/2020 2319   BILIRUBINUR NEGATIVE 05/27/2020 2319   KETONESUR 20 (A) 05/27/2020 2319   PROTEINUR 100 (A) 05/27/2020 2319   UROBILINOGEN 0.2 05/22/2020 1417   NITRITE NEGATIVE 05/27/2020 2319   LEUKOCYTESUR NEGATIVE 05/27/2020 2319   Sepsis Labs Invalid input(s): PROCALCITONIN,  WBC,  LACTICIDVEN Microbiology Recent Results (from the past 240 hour(s))  Resp Panel by RT-PCR (Flu A&B, Covid) Nasopharyngeal Swab     Status: Abnormal   Collection Time: 05/27/20  3:06 PM   Specimen: Nasopharyngeal Swab; Nasopharyngeal(NP) swabs in vial transport medium  Result  Value Ref Range Status   SARS Coronavirus 2 by  RT PCR POSITIVE (A) NEGATIVE Final    Comment: CRITICAL RESULT CALLED TO, READ BACK BY AND VERIFIED WITH: HEATHER BULWIN RN 05/27/20 @1926  BY P.HENDERSON    Influenza A by PCR NEGATIVE NEGATIVE Final   Influenza B by PCR NEGATIVE NEGATIVE Final    Comment: (NOTE) The Xpert Xpress SARS-CoV-2/FLU/RSV plus assay is intended as an aid in the diagnosis of influenza from Nasopharyngeal swab specimens and should not be used as a sole basis for treatment. Nasal washings and aspirates are unacceptable for Xpert Xpress SARS-CoV-2/FLU/RSV testing.  Fact Sheet for Patients: EntrepreneurPulse.com.au  Fact Sheet for Healthcare Providers: IncredibleEmployment.be  This test is not yet approved or cleared by the [place] FDA and has been authorized for detection and/or diagnosis of SARS-CoV-2 by FDA under an Emergency Use Authorization (EUA). This EUA will remain in effect (meaning this test can be used) for the duration of the COVID-19 declaration under Section 564(b)(1) of the Act, 21 U.S.C. section 360bbb-3(b)(1), unless the authorization is terminated or revoked.  Performed at [facility], [place] [address], [place], [place] [address]   Blood Culture (routine x 2)     Status: None (Preliminary result)   Collection Time: 05/27/20  3:11 PM   Specimen: BLOOD RIGHT HAND  Result Value Ref Range Status   Specimen Description   Final    BLOOD RIGHT HAND Performed at [facility] [address], [place], [place] [address]    Special Requests   Final    BOTTLES DRAWN AEROBIC AND ANAEROBIC Blood Culture adequate volume Performed at [facility] [address] [place], [place] [address]    Culture   Final    NO GROWTH < 12 HOURS Performed at [facility] [address] [place], [place] [address]    Report Status PENDING  Incomplete  Blood Culture (routine x 2)     Status: None (Preliminary  result)   Collection Time: 05/27/20  3:59 PM   Specimen: BLOOD RIGHT WRIST  Result Value Ref Range Status   Specimen Description   Final    BLOOD RIGHT WRIST Performed at [facility] [address], [place], [place] [address]    Special Requests   Final    BOTTLES DRAWN AEROBIC AND ANAEROBIC Blood Culture adequate volume Performed at [facility] [address], [place], [place] [address]    Culture   Final    NO GROWTH < 12 HOURS Performed at [facility] [address] [place], [place] [address]    Report Status PENDING  Incomplete     Time coordinating discharge: Over 30 minutes  SIGNED:   Little Ishikawa, DO [facility] 05/28/2020, 4:19 PM Pager   If 7PM-7AM, please contact night-coverage [email or web address]

## 2020-05-28 NOTE — Progress Notes (Signed)
Inpatient Diabetes Program Recommendations  AACE/ADA: New Consensus Statement on Inpatient Glycemic Control (2015)  Target Ranges:  Prepandial:   less than 140 mg/dL      Peak postprandial:   less than 180 mg/dL (1-2 hours)      Critically ill patients:  140 - 180 mg/dL   Lab Results  Component Value Date   GLUCAP 198 (H) 05/28/2020   HGBA1C 11.5 (H) 05/09/2020    Review of Glycemic Control  Diabetes history: DM1 Outpatient Diabetes medications: Tresiba 13 units QHS, Novolin R 0-15 units TID with meals Current orders for Inpatient glycemic control: Lantus 13 units QHS, Novolog 0-9 units TID with meals and 0-5 HS  HgbA1C - 11.5% Covid 19+ Will need meal coverage insulin since Type 1 DM and makes no insulin. 225, 198  Inpatient Diabetes Program Recommendations:     Consider adding Novolog 3 units TID with meals/supplements for CHO coverage.  Will attempt to call pt and speak with her about HgbA1C of 11.5%.  Continue to follow.  Thank you. Ailene Ards, RD, LDN, CDE Inpatient Diabetes Coordinator [phone]

## 2020-05-28 NOTE — Plan of Care (Signed)
  Problem: Clinical Measurements: Goal: Will remain free from infection Outcome: Progressing Goal: Diagnostic test results will improve Outcome: Progressing Goal: Respiratory complications will improve Outcome: Progressing   Problem: Activity: Goal: Risk for activity intolerance will decrease Outcome: Progressing   Problem: Coping: Goal: Level of anxiety will decrease Outcome: Progressing   

## 2020-05-28 NOTE — Discharge Instructions (Signed)
10 Things You Can Do to Manage Your COVID-19 Symptoms at Home If you have possible or confirmed COVID-19: 1. Stay home except to get medical care. 2. Monitor your symptoms carefully. If your symptoms get worse, call your healthcare provider immediately. 3. Get rest and stay hydrated. 4. If you have a medical appointment, call the healthcare provider ahead of time and tell them that you have or may have COVID-19. 5. For medical emergencies, call 911 and notify the dispatch personnel that you have or may have COVID-19. 6. Cover your cough and sneezes with a tissue or use the inside of your elbow. 7. Wash your hands often with soap and water for at least 20 seconds or clean your hands with an alcohol-based hand sanitizer that contains at least 60% alcohol. 8. As much as possible, stay in a specific room and away from other people in your home. Also, you should use a separate bathroom, if available. If you need to be around other people in or outside of the home, wear a mask. 9. Avoid sharing personal items with other people in your household, like dishes, towels, and bedding. 10. Clean all surfaces that are touched often, like counters, tabletops, and doorknobs. Use household cleaning sprays or wipes according to the label instructions. cdc.gov/coronavirus 09/02/2019 This information is not intended to replace advice given to you by your health care provider. Make sure you discuss any questions you have with your health care provider. Document Revised: 12/19/2019 Document Reviewed: 12/19/2019 Elsevier Patient Education  2021 Else[employer] Inc.  COVID-19 Quarantine vs. Isolation QUARANTINE keeps someone who was in close contact with someone who has COVID-19 away from others. Quarantine if you have been in close contact with someone who has COVID-19, unless you have been fully vaccinated. If you are fully vaccinated  You do NOT need to quarantine unless they have symptoms  Get tested 3-5 days after  your exposure, even if you don't have symptoms  Wear a mask indoors in public for 14 days following exposure or until your test result is negative If you are not fully vaccinated  Stay home for 14 days after your last contact with a person who has COVID-19  Watch for fever (100.4F), cough, shortness of breath, or other symptoms of COVID-19  If possible, stay away from people you live with, especially people who are at higher risk for getting very sick from COVID-19  Contact your local public health department for options in your area to possibly shorten your quarantine ISOLATION keeps someone who is sick or tested positive for COVID-19 without symptoms away from others, even in their own home. People who are in isolation should stay home and stay in a specific "sick room" or area and use a separate bathroom (if available). If you are sick and think or know you have COVID-19 Stay home until after  At least 10 days since symptoms first appeared and  At least 24 hours with no fever without the use of fever-reducing medications and  Symptoms have improved If you tested positive for COVID-19 but do not have symptoms  Stay home until after 10 days have passed since your positive viral test  If you develop symptoms after testing positive, follow the steps above for those who are sick cdc.gov/coronavirus 11/14/2019 This information is not intended to replace advice given to you by your health care provider. Make sure you discuss any questions you have with your health care provider. Document Revised: 12/19/2019 Document Reviewed: 12/19/2019 Elsevier Patient Education    2021 Elsevier Inc.  COVID-19: What Your Test Results Mean If you test positive for COVID-19 Take steps to protect others regardless of your COVID-19 vaccination status Stay home.  Isolate at home for at least 10 days. Stay in a specific room and away from other people in your home. Get rest and stay hydrated. If you  develop symptoms, continue to isolate for at least 10 days after symptoms began and until you do not have a fever without using medications to reduce fever. Stay in touch with your doctor. Contact your doctor as soon as possible if you are an older adult or have underlying medical conditions. Contact your doctor or health department about isolation if you  Are severely ill or have a weakened immune system.  Had a positive test result followed by a negative result.  Test positive for many weeks. If you test negative for COVID-19:  The virus was not detected. If you have symptoms of COVID-19:  You may have received a false negative test result and still might have COVID-19.  Isolate from others. If you do not have symptoms of COVID-19 and you were exposed to a person with COVID-19:  You are likely not infected, but you still may get sick.  Contact your doctor about your symptoms, about follow-up testing, and how long to isolate.  Self-quarantine for 14 days at home after your exposure.  If you are fully vaccinated, you do not need to self quarantine.  Contact your doctor or local health department regarding options to reduce the length of your quarantine. A negative test result does not mean you won't get sick later. [email or web address] 11/15/2019 This information is not intended to replace advice given to you by your health care provider. Make sure you discuss any questions you have with your health care provider. Document Revised: 12/19/2019 Document Reviewed: 12/19/2019 Elsevier Patient Education  2021 Elsevier Inc.  COVID-19: What to Do if You Are Sick If you have a fever, cough or other symptoms, you might have COVID-19. Most people have mild illness and are able to recover at home. If you are sick:  Keep track of your symptoms.  If you have an emergency warning sign (including trouble breathing), call 911. Steps to help prevent the spread of COVID-19 if you are sick If  you are sick with COVID-19 or think you might have COVID-19, follow the steps below to care for yourself and to help protect other people in your home and community. Stay home except to get medical care  Stay home. Most people with COVID-19 have mild illness and can recover at home without medical care. Do not leave your home, except to get medical care. Do not visit public areas.  Take care of yourself. Get rest and stay hydrated. Take over-the-counter medicines, such as acetaminophen, to help you feel better.  Stay in touch with your doctor. Call before you get medical care. Be sure to get care if you have trouble breathing, or have any other emergency warning signs, or if you think it is an emergency.  Avoid public transportation, ride-sharing, or taxis. Separate yourself from other people As much as possible, stay in a specific room and away from other people and pets in your home. If possible, you should use a separate bathroom. If you need to be around other people or animals in or outside of the home, wear a mask. Tell your close contactsthat they may have been exposed to COVID-19. An infected person can spread COVID-19 starting  48 hours (or 2 days) before the person has any symptoms or tests positive. By letting your close contacts know they may have been exposed to COVID-19, you are helping to protect everyone.  Additional guidance is available for those living in close quarters and shared housing.  See COVID-19 and Animals if you have questions about pets.  If you are diagnosed with COVID-19, someone from the health department may call you. Answer the call to slow the spread. Monitor your symptoms  Symptoms of COVID-19 include fever, cough, or other symptoms.  Follow care instructions from your healthcare provider and local health department. Your local health authorities may give instructions on checking your symptoms and reporting information. When to seek emergency medical  attention Look for emergency warning signs* for COVID-19. If someone is showing any of these signs, seek emergency medical care immediately:  Trouble breathing  Persistent pain or pressure in the chest  New confusion  Inability to wake or stay awake  Pale, gray, or blue-colored skin, lips, or nail beds, depending on skin tone *This list is not all possible symptoms. Please call your medical provider for any other symptoms that are severe or concerning to you. Call 911 or call ahead to your local emergency facility: Notify the operator that you are seeking care for someone who has or may have COVID-19. Call ahead before visiting your doctor  Call ahead. Many medical visits for routine care are being postponed or done by phone or telemedicine.  If you have a medical appointment that cannot be postponed, call your doctor's office, and tell them you have or may have COVID-19. This will help the office protect themselves and other patients. Get  tested  If you have symptoms of COVID-19, get tested. While waiting for test results, you stay away from others, including staying apart from those living in your household.  You can visit your state, tribal, local, and territorialhealth department's website to look for the latest local information on testing sites. If you are sick, wear a mask over your nose and mouth  You should wear a mask over your nose and mouth if you must be around other people or animals, including pets (even at home).  You don't need to wear the mask if you are alone. If you can't put on a mask (because of trouble breathing, for example), cover your coughs and sneezes in some other way. Try to stay at least 6 feet away from other people. This will help protect the people around you.  Masks should not be placed on young children under age 54 years, anyone who has trouble breathing, or anyone who is not able to remove the mask without help. Note: During the COVID-19 pandemic,  medical grade facemasks are reserved for healthcare workers and some first responders. Cover your coughs and sneezes  Cover your mouth and nose with a tissue when you cough or sneeze.  Throw away used tissues in a lined trash can.  Immediately wash your hands with soap and water for at least 20 seconds. If soap and water are not available, clean your hands with an alcohol-based hand sanitizer that contains at least 60% alcohol. Clean your hands often  Wash your hands often with soap and water for at least 20 seconds. This is especially important after blowing your nose, coughing, or sneezing; going to the bathroom; and before eating or preparing food.  Use hand sanitizer if soap and water are not available. Use an alcohol-based hand sanitizer with at  least 60% alcohol, covering all surfaces of your hands and rubbing them together until they feel dry.  Soap and water are the best option, especially if hands are visibly dirty.  Avoid touching your eyes, nose, and mouth with unwashed hands.  Handwashing Tips Avoid sharing personal household items  Do not share dishes, drinking glasses, cups, eating utensils, towels, or bedding with other people in your home.  Wash these items thoroughly after using them with soap and water or put in the dishwasher. Clean all "high-touch" surfaces everyday  Clean and disinfect high-touch surfaces in your "sick room" and bathroom; wear disposable gloves. Let someone else clean and disinfect surfaces in common areas, but you should clean your bedroom and bathroom, if possible.  If a caregiver or other person needs to clean and disinfect a sick person's bedroom or bathroom, they should do so on an as-needed basis. The caregiver/other person should wear a mask and disposable gloves prior to cleaning. They should wait as long as possible after the person who is sick has used the bathroom before coming in to clean and use the bathroom. ? High-touch surfaces  include phones, remote controls, counters, tabletops, doorknobs, bathroom fixtures, toilets, keyboards, tablets, and bedside tables.  Clean and disinfect areas that may have blood, stool, or body fluids on them.  Use household cleaners and disinfectants. Clean the area or item with soap and water or another detergent if it is dirty. Then, use a household disinfectant. ? Be sure to follow the instructions on the label to ensure safe and effective use of the product. Many products recommend keeping the surface wet for several minutes to ensure germs are killed. Many also recommend precautions such as wearing gloves and making sure you have good ventilation during use of the product. ? Use a product from [employer] List N: Disinfectants for Coronavirus (COVID-19). ? Complete Disinfection Guidance When you can be around others after being sick with COVID-19 Deciding when you can be around others is different for different situations. Find out when you can safely end home isolation. For any additional questions about your care, contact your healthcare provider or state or local health department. 05/04/2019 Content source: [facility] ([facility]), Division of Viral Diseases This information is not intended to replace advice given to you by your health care provider. Make sure you discuss any questions you have with your health care provider. Document Revised: 12/19/2019 Document Reviewed: 12/19/2019 Elsevier Patient Education  2021 [employer].

## 2020-05-28 NOTE — Evaluation (Signed)
SATURATION QUALIFICATIONS: (This note is used to comply with regulatory documentation for home oxygen)  Patient Saturations on Room Air at Rest =100%  Patient Saturations on Room Air while Ambulating = 96%  Patient Saturations on 0Liters of oxygen while Ambulating =96%  Please briefly explain why patient needs home oxygen: 

## 2020-05-29 ENCOUNTER — Telehealth: Payer: Self-pay | Admitting: *Deleted

## 2020-05-29 LAB — LEGIONELLA PNEUMOPHILA SEROGP 1 UR AG: L. pneumophila Serogp 1 Ur Ag: NEGATIVE

## 2020-05-29 LAB — URINE CULTURE: Culture: 40000 — AB

## 2020-05-29 NOTE — Chronic Care Management (AMB) (Signed)
  Care Management   Note  05/29/2020 Name: Kelli Hudson MRN: [id number] DOB: [birth year]  Kelli Hudson is a 30 y.o. year old female who is a primary care patient of Loyola Mast, MD and is actively engaged with the care management team. I reached out to [employer] by phone today to assist with re-scheduling an initial visit with the RN Case Manager  Follow up plan: Unsuccessful telephone outreach attempt made. A HIPAA compliant phone message was left for the patient providing contact information and requesting a return call.  The care management team will reach out to the patient again over the next 5 days.  If patient returns call to provider office, please advise to call Embedded Care Management Care Guide Kelli Hudson at [phone]  Kelli Hudson [facility] Guide, Embedded Care Coordination [facility]  Care Management

## 2020-05-30 ENCOUNTER — Telehealth: Payer: Self-pay

## 2020-05-30 NOTE — Telephone Encounter (Signed)
Transition Care Management Follow-up Telephone Call  Date of discharge and from where: 05/28/2020-[facility]  How have you been since you were released from the hospital? Doing ok but still coughing  Any questions or concerns? No  Items Reviewed:  Did the pt receive and understand the discharge instructions provided? Yes   Medications obtained and verified? Yes   Other? Yes   Any new allergies since your discharge? No   Dietary orders reviewed? Yes  Do you have support at home? Yes   Home Care and Equipment/Supplies: Were home health services ordered? no If so, what is the name of the agency? n/a  Has the agency set up a time to come to the patient's home? not applicable Were any new equipment or medical supplies ordered?  No What is the name of the medical supply agency? n/a Were you able to get the supplies/equipment? not applicable Do you have any questions related to the use of the equipment or supplies?n/a  Functional Questionnaire: (I = Independent and D = Dependent) ADLs: I  Bathing/Dressing- I  Meal Prep- I  Eating- I  Maintaining continence- I  Transferring/Ambulation- I  Managing Meds- I  Follow up appointments reviewed:   PCP Hospital f/u appt confirmed? Yes  Scheduled to see Dr. Veto Kemps on 06/04/20 @ 11:30.  Specialist Hospital f/u appt confirmed? n/a   Are transportation arrangements needed? No   If their condition worsens, is the pt aware to call PCP or go to the Emergency Dept.? Yes  Was the patient provided with contact information for the PCP's office or ED? Yes  Was to pt encouraged to call back with questions or concerns? Yes

## 2020-06-01 LAB — CULTURE, BLOOD (ROUTINE X 2)
Culture: NO GROWTH
Culture: NO GROWTH
Special Requests: ADEQUATE
Special Requests: ADEQUATE

## 2020-06-04 ENCOUNTER — Encounter: Payer: Self-pay | Admitting: Family Medicine

## 2020-06-04 ENCOUNTER — Telehealth (INDEPENDENT_AMBULATORY_CARE_PROVIDER_SITE_OTHER): Payer: 59 | Admitting: Family Medicine

## 2020-06-04 VITALS — Ht 68.0 in | Wt 165.0 lb

## 2020-06-04 DIAGNOSIS — J690 Pneumonitis due to inhalation of food and vomit: Secondary | ICD-10-CM | POA: Diagnosis not present

## 2020-06-04 DIAGNOSIS — U071 COVID-19: Secondary | ICD-10-CM | POA: Diagnosis not present

## 2020-06-04 NOTE — Progress Notes (Signed)
[facility] [address] [place] [address] Dept: [phone] Dept Fax: [phone]  Virtual Video Visit  I connected with Kelli Hudson on 06/04/20 at 11:30 AM EDT by a video enabled telemedicine application and verified that I am speaking with the correct person using two identifiers.  Location patient: Home Location provider: Clinic Persons participating in the virtual visit: Patient, Provider  I discussed the limitations of evaluation and management by telemedicine and the availability of in person appointments. The patient expressed understanding and agreed to proceed.  Chief Complaint  Patient presents with  . Hospitalization Follow-up    Pt was in the hospital x 1 week ago for COVID and pneumonia, and states she has been doing well since leaving the hospital. Pt finished the 4 day antibiotic she was taking and states she still has a dry cough but no other issues.     SUBJECTIVE:  HPI: Kelli Hudson is a 30 y.o. female who presents for follow-up from a recent hospitalization. She was admitted at [facility] from 4/10-4/12/2020. She had developed cough and congestion around 4/7. During her hospitalization, she was found to have a RUL infiltrate. It was felt to likely be an aspiration pneumonitis, esp. in light of her diabetic gastroparesis issues. However, she was treated with antibiotics and has not completed her course of azithromycin. Additionally, she tested positive for COVID. She was treated with 2 doses of Remdesivir.  Since discharge, she has seen improvements in her symptoms, though she has some lingering cough and dyspnea. She was provided an albuterol inhaler and is only needing this 1-2 times a day. She denies any nighttime use. She notes her blood sugars were initially high, but are now in the mid-100s fasting. She was provided a sliding scale of insulin. This morning, she only needed to take 2  units of regular insulin.  Patient Active Problem List   Diagnosis Date Noted  . Sepsis ([place]) 05/27/2020  . Borderline hyperlipidemia 05/23/2020  . Gastroparesis due to DM ([place]) 05/11/2020  . 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 Tachycardia 05/09/2020  . Normocytic anemia 04/20/2020  . Hyperosmolar hyperglycemic state (HHS) ([place]) 02/24/2020  . Intractable nausea and vomiting   . Intractable abdominal pain 01/24/2020  . H. pylori infection 10/07/2019  . Depression with anxiety 07/05/2019  . Diabetic foot ulcer ([place]) 02/21/2018  . Diabetic polyneuropathy associated with type 2 diabetes mellitus ([place]) 09/24/2017  . Pain in right foot 09/24/2017  . Diabetic ketoacidosis without coma associated with type 2 diabetes mellitus ([place]) 01/02/2016  . Gastroparesis   . Leukocytosis   . Diabetes mellitus due to underlying condition, uncontrolled, with hyperosmolarity without coma, without long-term current use of insulin ([place])   . Increased frequency of urination 06/14/2011  . UTI (urinary tract infection) 06/14/2011  . Goiter 10/05/2009  . Uncontrolled diabetes mellitus ([place]) 10/05/2009  . Essential hypertension 10/05/2009   Past Surgical History:  Procedure Laterality Date  . AMPUTATION TOE Left 03/10/2018   Procedure: AMPUTATION FIFTH TOE;  Surgeon: Trula Slade, DPM;  Location: [place];  Service: Podiatry;  Laterality: Left;  . BIOPSY  01/28/2020   Procedure: BIOPSY;  Surgeon: Otis Brace, MD;  Location: [facility];  Service: Gastroenterology;;  . ESOPHAGOGASTRODUODENOSCOPY N/A 01/28/2020   Procedure: ESOPHAGOGASTRODUODENOSCOPY (EGD);  Surgeon: Otis Brace, MD;  Location: [facility];  Service: Gastroenterology;  Laterality: N/A;  . ESOPHAGOGASTRODUODENOSCOPY (EGD) WITH PROPOFOL Left 09/08/2015   Procedure: ESOPHAGOGASTRODUODENOSCOPY (EGD) WITH  PROPOFOL;  Surgeon: Arta Silence, MD;  Location: [facility] [place];   Service: Endoscopy;  Laterality: Left;  . WISDOM TOOTH EXTRACTION     Family History  Problem Relation Age of Onset  . Lung cancer Mother   . Diabetes Mother   . Bipolar disorder Father   . Liver cancer Maternal Grandfather   . Breast cancer Other        maternal great aunt  . Heart disease Other   . Ovarian cancer Other        maternal great aunt  . Pancreatic cancer Maternal Uncle   . Prostate cancer Maternal Uncle   . Diabetes Maternal Aunt        x 2  . Kidney disease Other        maternal great aunt   Social History   Tobacco Use  . Smoking status: Never Smoker  . Smokeless tobacco: Never Used  Vaping Use  . Vaping Use: Never used  Substance Use Topics  . Alcohol use: Yes    Alcohol/week: 1.0 standard drink    Types: 1 Shots of liquor per week    Comment: occasional   . Drug use: No    Current Outpatient Medications:  .  atenolol (TENORMIN) 25 MG tablet, Take 1 tablet (25 mg total) by mouth 2 (two) times daily., Disp: 60 tablet, Rfl: 2 .  Blood Glucose Monitoring Suppl (TRUE METRIX METER) w/Device KIT, 1 Device by Does not apply route 4 (four) times daily., Disp: 1 kit, Rfl: 0 .  famotidine (PEPCID) 20 MG tablet, Take 1 tablet (20 mg total) by mouth 2 (two) times daily., Disp: 60 tablet, Rfl: 11 .  feeding supplement, ENSURE COMPLETE, (ENSURE COMPLETE) LIQD, Take 237 mLs by mouth 2 (two) times daily between meals., Disp: 14220 mL, Rfl: 3 .  glucose blood (TRUE METRIX BLOOD GLUCOSE TEST) test strip, Use as instructed, Disp: 100 each, Rfl: 12 .  hyoscyamine (LEVBID) 0.375 MG 12 hr tablet, Take 1 tablet (0.375 mg total) by mouth every 12 (twelve) hours as needed for cramping (abdominal pain)., Disp: 60 tablet, Rfl: 0 .  insulin degludec (TRESIBA FLEXTOUCH) 100 UNIT/ML FlexTouch Pen, Inject 13 Units into the skin at bedtime., Disp: , Rfl:  .  Insulin Pen Needle 31G X 5 MM MISC, 1 Device by Does not apply route QID. For use with insulin pens, Disp: 100 each, Rfl: 0 .   insulin regular (NOVOLIN R) 100 units/mL injection, Inject 0-0.15 mLs (0-15 Units total) into the skin 3 (three) times daily before meals. CBG 70 - 120: 0 units CBG 121 - 150: 2 units CBG 151 - 200: 3 units CBG 201 - 250: 5 units CBG 251 - 300: 8 units CBG 301 - 350: 11 units CBG 351 - 400: 15 units, Disp: 10 mL, Rfl: 0 .  ipratropium (ATROVENT) 0.03 % nasal spray, Place 2 sprays into both nostrils 2 (two) times daily as needed for rhinitis., Disp: , Rfl:  .  levalbuterol (XOPENEX HFA) 45 MCG/ACT inhaler, Inhale 2 puffs into the lungs every 6 (six) hours as needed for wheezing or shortness of breath., Disp: 1 each, Rfl: 0 .  lisinopril (ZESTRIL) 5 MG tablet, Take 5 mg by mouth daily. , Disp: , Rfl:  .  metoCLOPramide (REGLAN) 5 MG tablet, Take 1 tablet (5 mg total) by mouth 3 (three) times daily before meals., Disp: 90 tablet, Rfl: 5 .  Multiple Vitamin (MULTIVITAMIN WITH MINERALS) TABS tablet, Take 1 tablet by mouth daily., Disp: ,  Rfl:  .  ondansetron (ZOFRAN ODT) 4 MG disintegrating tablet, Take 1 tablet (4 mg total) by mouth every 6 (six) hours as needed for nausea or vomiting., Disp: 40 tablet, Rfl: 6 .  pantoprazole (PROTONIX) 40 MG tablet, Take 1 tablet (40 mg total) by mouth 2 (two) times daily., Disp: 60 tablet, Rfl: 0 .  promethazine (PHENERGAN) 12.5 MG tablet, Take 1 tablet (12.5 mg total) by mouth every 6 (six) hours as needed for nausea or vomiting., Disp: 30 tablet, Rfl: 0 .  TRUEPLUS LANCETS 28G MISC, assist with checking blood sugar TID and qhs, Disp: 100 each, Rfl: 3 .  benzonatate (TESSALON) 200 MG capsule, Take 200 mg by mouth 3 (three) times daily as needed for cough. (Patient not taking: Reported on 06/04/2020), Disp: , Rfl:   No Known Allergies  ROS: See pertinent positives and negatives per HPI.  OBSERVATIONS/OBJECTIVE:  VITALS per patient if applicable: Today's Vitals   06/04/20 1127  Weight: 165 lb (74.8 kg)  Height: _0  (1.727 m)   Body mass index is 25.09 kg/m.     GENERAL: Alert and oriented. Appears well and in no acute distress.  HEENT: Atraumatic. Conjunctiva clear. No obvious abnormalities on inspection of external nose and ears.  NECK: Normal movements of the head and neck.  LUNGS: On inspection, no signs of respiratory distress. Breathing rate appears normal. No obvious gross SOB, gasping or wheezing, and no conversational dyspnea.  CV: No obvious cyanosis.  PSYCH/NEURO: Pleasant and cooperative. No obvious depression or anxiety. Speech and thought processing grossly intact.  ASSESSMENT AND PLAN:  1. COVID-19 2. Aspiration pneumonitis ([place])  I reviewed the discharge summary and x-ray results. Ms. Baria is improving at this point. The discharge physician had requested a follow-up BMP and CBC for 1 week after discharge. I placed orders for this and have asked Ms. Johnson-Alston to come by for lab work. I will plan to follow-up with her at her next regularly scheduled visit.  - CBC; Future - Basic metabolic panel; Future  I discussed the assessment and treatment plan with the patient. The patient was provided an opportunity to ask questions and all were answered. The patient agreed with the plan and demonstrated an understanding of the instructions.   The patient was advised to call back or seek an in-person evaluation if the symptoms worsen or if the condition fails to improve as anticipated.   [name], MD

## 2020-06-04 NOTE — Chronic Care Management (AMB) (Signed)
  Care Management   Note  06/04/2020 Name: Kelli Hudson MRN: [id number] DOB: 1991-02-02  [name] is a 30 y.o. year old female who is a primary care patient of Loyola Mast, MD and is actively engaged with the care management team. I reached out to [employer] by phone today to assist with re-scheduling an initial visit with the RN Case Manager  Follow up plan: Telephone appointment with care management team member scheduled for:  06/13/2020  [facility] Guide, Embedded Care Coordination [facility]  Care Management

## 2020-06-05 ENCOUNTER — Ambulatory Visit: Payer: 59 | Admitting: Nutrition

## 2020-06-05 ENCOUNTER — Emergency Department (HOSPITAL_COMMUNITY): Payer: 59

## 2020-06-05 ENCOUNTER — Inpatient Hospital Stay (HOSPITAL_COMMUNITY)
Admission: EM | Admit: 2020-06-05 | Discharge: 2020-06-09 | DRG: 074 | Disposition: A | Discharge status: Home / Self Care | Payer: 59 | Attending: Internal Medicine | Admitting: Internal Medicine

## 2020-06-05 ENCOUNTER — Encounter (HOSPITAL_COMMUNITY): Payer: Self-pay | Admitting: Emergency Medicine

## 2020-06-05 DIAGNOSIS — Z89422 Acquired absence of other left toe(s): Secondary | ICD-10-CM | POA: Diagnosis not present

## 2020-06-05 DIAGNOSIS — Z794 Long term (current) use of insulin: Secondary | ICD-10-CM

## 2020-06-05 DIAGNOSIS — E039 Hypothyroidism, unspecified: Secondary | ICD-10-CM | POA: Diagnosis present

## 2020-06-05 DIAGNOSIS — K3184 Gastroparesis: Secondary | ICD-10-CM | POA: Diagnosis present

## 2020-06-05 DIAGNOSIS — Z803 Family history of malignant neoplasm of breast: Secondary | ICD-10-CM

## 2020-06-05 DIAGNOSIS — Z818 Family history of other mental and behavioral disorders: Secondary | ICD-10-CM | POA: Diagnosis not present

## 2020-06-05 DIAGNOSIS — Z8711 Personal history of peptic ulcer disease: Secondary | ICD-10-CM

## 2020-06-05 DIAGNOSIS — Z8041 Family history of malignant neoplasm of ovary: Secondary | ICD-10-CM

## 2020-06-05 DIAGNOSIS — R109 Unspecified abdominal pain: Secondary | ICD-10-CM | POA: Diagnosis present

## 2020-06-05 DIAGNOSIS — R110 Nausea: Secondary | ICD-10-CM

## 2020-06-05 DIAGNOSIS — Z801 Family history of malignant neoplasm of trachea, bronchus and lung: Secondary | ICD-10-CM

## 2020-06-05 DIAGNOSIS — F32A Depression, unspecified: Secondary | ICD-10-CM | POA: Diagnosis present

## 2020-06-05 DIAGNOSIS — E0590 Thyrotoxicosis, unspecified without thyrotoxic crisis or storm: Secondary | ICD-10-CM | POA: Diagnosis present

## 2020-06-05 DIAGNOSIS — E860 Dehydration: Secondary | ICD-10-CM

## 2020-06-05 DIAGNOSIS — Z800 Family history of malignant neoplasm of digestive organs: Secondary | ICD-10-CM | POA: Diagnosis not present

## 2020-06-05 DIAGNOSIS — Z20822 Contact with and (suspected) exposure to covid-19: Secondary | ICD-10-CM | POA: Diagnosis present

## 2020-06-05 DIAGNOSIS — K219 Gastro-esophageal reflux disease without esophagitis: Secondary | ICD-10-CM | POA: Diagnosis present

## 2020-06-05 DIAGNOSIS — I10 Essential (primary) hypertension: Secondary | ICD-10-CM | POA: Diagnosis present

## 2020-06-05 DIAGNOSIS — Z8616 Personal history of COVID-19: Secondary | ICD-10-CM | POA: Diagnosis not present

## 2020-06-05 DIAGNOSIS — N179 Acute kidney failure, unspecified: Secondary | ICD-10-CM | POA: Diagnosis not present

## 2020-06-05 DIAGNOSIS — Z833 Family history of diabetes mellitus: Secondary | ICD-10-CM | POA: Diagnosis not present

## 2020-06-05 DIAGNOSIS — Z79899 Other long term (current) drug therapy: Secondary | ICD-10-CM

## 2020-06-05 DIAGNOSIS — E1043 Type 1 diabetes mellitus with diabetic autonomic (poly)neuropathy: Secondary | ICD-10-CM | POA: Diagnosis not present

## 2020-06-05 LAB — CBC
HCT: 35.2 % — ABNORMAL LOW (ref 36.0–46.0)
Hemoglobin: 11.6 g/dL — ABNORMAL LOW (ref 12.0–15.0)
MCH: 29.4 pg (ref 26.0–34.0)
MCHC: 33 g/dL (ref 30.0–36.0)
MCV: 89.3 fL (ref 80.0–100.0)
Platelets: 642 10*3/uL — ABNORMAL HIGH (ref 150–400)
RBC: 3.94 MIL/uL (ref 3.87–5.11)
RDW: 13.2 % (ref 11.5–15.5)
WBC: 8.7 10*3/uL (ref 4.0–10.5)
nRBC: 0 % (ref 0.0–0.2)

## 2020-06-05 LAB — COMPREHENSIVE METABOLIC PANEL
ALT: 30 U/L (ref 0–44)
AST: 25 U/L (ref 15–41)
Albumin: 3.9 g/dL (ref 3.5–5.0)
Alkaline Phosphatase: 73 U/L (ref 38–126)
Anion gap: 15 (ref 5–15)
BUN: 28 mg/dL — ABNORMAL HIGH (ref 6–20)
CO2: 23 mmol/L (ref 22–32)
Calcium: 9.9 mg/dL (ref 8.9–10.3)
Chloride: 97 mmol/L — ABNORMAL LOW (ref 98–111)
Creatinine, Ser: 1.37 mg/dL — ABNORMAL HIGH (ref 0.44–1.00)
GFR, Estimated: 54 mL/min — ABNORMAL LOW (ref >60)
Glucose, Bld: 438 mg/dL — ABNORMAL HIGH (ref 70–99)
Potassium: 4.5 mmol/L (ref 3.5–5.1)
Sodium: 135 mmol/L (ref 135–145)
Total Bilirubin: 1.2 mg/dL (ref 0.3–1.2)
Total Protein: 8.6 g/dL — ABNORMAL HIGH (ref 6.5–8.1)

## 2020-06-05 LAB — URINALYSIS, ROUTINE W REFLEX MICROSCOPIC
Bacteria, UA: NONE SEEN
Bilirubin Urine: NEGATIVE
Glucose, UA: >=500 mg/dL — AB
Ketones, ur: 20 mg/dL — AB
Leukocytes,Ua: NEGATIVE
Nitrite: NEGATIVE
Protein, ur: >=300 mg/dL — AB
Specific Gravity, Urine: 1.02 (ref 1.005–1.030)
pH: 5 (ref 5.0–8.0)

## 2020-06-05 LAB — RESP PANEL BY RT-PCR (FLU A&B, COVID) ARPGX2
Influenza A by PCR: NEGATIVE
Influenza B by PCR: NEGATIVE
SARS Coronavirus 2 by RT PCR: POSITIVE — AB

## 2020-06-05 LAB — LIPASE, BLOOD: Lipase: 25 U/L (ref 11–51)

## 2020-06-05 LAB — I-STAT BETA HCG BLOOD, ED (MC, WL, AP ONLY): I-stat hCG, quantitative: <5 m[IU]/mL (ref <5)

## 2020-06-05 MED ORDER — ONDANSETRON HCL 4 MG PO TABS
4.0000 mg | ORAL_TABLET | Freq: Four times a day (QID) | ORAL | Status: DC | PRN
  Administered 2020-06-06: 4 mg via ORAL
  Filled 2020-06-05: qty 1

## 2020-06-05 MED ORDER — MORPHINE SULFATE (PF) 2 MG/ML IV SOLN
1.0000 mg | Freq: Four times a day (QID) | INTRAVENOUS | Status: DC | PRN
  Administered 2020-06-06 – 2020-06-07 (×6): 1 mg via INTRAVENOUS
  Filled 2020-06-05 (×6): qty 1

## 2020-06-05 MED ORDER — ONDANSETRON HCL 4 MG/2ML IJ SOLN
4.0000 mg | Freq: Once | INTRAMUSCULAR | Status: AC
  Administered 2020-06-05: 4 mg via INTRAVENOUS
  Filled 2020-06-05: qty 2

## 2020-06-05 MED ORDER — HYDROMORPHONE HCL 1 MG/ML IJ SOLN
1.0000 mg | Freq: Once | INTRAMUSCULAR | Status: AC
  Administered 2020-06-05: 1 mg via INTRAVENOUS
  Filled 2020-06-05: qty 1

## 2020-06-05 MED ORDER — MAGNESIUM CITRATE PO SOLN: 1.0000 | Freq: Once | ORAL | Status: DC | PRN

## 2020-06-05 MED ORDER — SODIUM CHLORIDE 0.9 % IV BOLUS
2000.0000 mL | Freq: Once | INTRAVENOUS | Status: AC
  Administered 2020-06-05: 2000 mL via INTRAVENOUS

## 2020-06-05 MED ORDER — PANTOPRAZOLE SODIUM 40 MG IV SOLR
40.0000 mg | INTRAVENOUS | Status: DC
  Administered 2020-06-06: 40 mg via INTRAVENOUS
  Filled 2020-06-05: qty 40

## 2020-06-05 MED ORDER — IPRATROPIUM BROMIDE 0.02 % IN SOLN: 0.5000 mg | Freq: Four times a day (QID) | RESPIRATORY_TRACT | Status: DC | PRN

## 2020-06-05 MED ORDER — INSULIN ASPART 100 UNIT/ML ~~LOC~~ SOLN
0.0000 [IU] | SUBCUTANEOUS | Status: DC
  Administered 2020-06-06: 15 [IU] via SUBCUTANEOUS
  Administered 2020-06-06: 11 [IU] via SUBCUTANEOUS
  Administered 2020-06-06: 15 [IU] via SUBCUTANEOUS
  Filled 2020-06-05: qty 0.15

## 2020-06-05 MED ORDER — FAMOTIDINE 20 MG PO TABS
20.0000 mg | ORAL_TABLET | Freq: Two times a day (BID) | ORAL | Status: DC
  Administered 2020-06-06 (×2): 20 mg via ORAL
  Filled 2020-06-05 (×2): qty 1

## 2020-06-05 MED ORDER — IPRATROPIUM-ALBUTEROL 0.5-2.5 (3) MG/3ML IN SOLN: 3.0000 mL | Freq: Four times a day (QID) | RESPIRATORY_TRACT | Status: DC | PRN

## 2020-06-05 MED ORDER — BISACODYL 5 MG PO TBEC: 5.0000 mg | DELAYED_RELEASE_TABLET | Freq: Every day | ORAL | Status: DC | PRN

## 2020-06-05 MED ORDER — ATENOLOL 25 MG PO TABS
25.0000 mg | ORAL_TABLET | Freq: Two times a day (BID) | ORAL | Status: DC
  Administered 2020-06-06 – 2020-06-09 (×8): 25 mg via ORAL
  Filled 2020-06-05 (×8): qty 1

## 2020-06-05 MED ORDER — SODIUM CHLORIDE 0.9 % IV SOLN: INTRAVENOUS | Status: AC

## 2020-06-05 MED ORDER — ONDANSETRON HCL 4 MG/2ML IJ SOLN
4.0000 mg | Freq: Four times a day (QID) | INTRAMUSCULAR | Status: DC | PRN
  Filled 2020-06-05: qty 2

## 2020-06-05 MED ORDER — LISINOPRIL 5 MG PO TABS
5.0000 mg | ORAL_TABLET | Freq: Every day | ORAL | Status: DC
  Administered 2020-06-06 – 2020-06-07 (×2): 5 mg via ORAL
  Filled 2020-06-05 (×2): qty 1

## 2020-06-05 MED ORDER — SENNOSIDES-DOCUSATE SODIUM 8.6-50 MG PO TABS: 1.0000 | ORAL_TABLET | Freq: Every evening | ORAL | Status: DC | PRN

## 2020-06-05 MED ORDER — ACETAMINOPHEN 650 MG RE SUPP: 650.0000 mg | Freq: Four times a day (QID) | RECTAL | Status: DC | PRN

## 2020-06-05 MED ORDER — ACETAMINOPHEN 325 MG PO TABS
650.0000 mg | ORAL_TABLET | Freq: Four times a day (QID) | ORAL | Status: DC | PRN
  Administered 2020-06-09: 650 mg via ORAL

## 2020-06-05 MED ORDER — TRAMADOL HCL 50 MG PO TABS
50.0000 mg | ORAL_TABLET | Freq: Four times a day (QID) | ORAL | Status: DC | PRN
  Administered 2020-06-06 – 2020-06-08 (×5): 50 mg via ORAL
  Filled 2020-06-05 (×5): qty 1

## 2020-06-05 MED ORDER — HYOSCYAMINE SULFATE ER 0.375 MG PO TB12
0.3750 mg | ORAL_TABLET | Freq: Two times a day (BID) | ORAL | Status: DC | PRN
  Administered 2020-06-06: 0.375 mg via ORAL
  Filled 2020-06-05 (×3): qty 1

## 2020-06-05 MED ORDER — TRAZODONE HCL 50 MG PO TABS
25.0000 mg | ORAL_TABLET | Freq: Every evening | ORAL | Status: DC | PRN
  Filled 2020-06-05: qty 1

## 2020-06-05 MED ORDER — IOHEXOL 300 MG/ML  SOLN
100.0000 mL | Freq: Once | INTRAMUSCULAR | Status: AC | PRN
  Administered 2020-06-05: 100 mL via INTRAVENOUS

## 2020-06-05 MED ORDER — ALBUTEROL SULFATE (2.5 MG/3ML) 0.083% IN NEBU: 2.5000 mg | INHALATION_SOLUTION | Freq: Four times a day (QID) | RESPIRATORY_TRACT | Status: DC | PRN

## 2020-06-05 NOTE — ED Notes (Signed)
ED TO INPATIENT HANDOFF REPORT  Name/Age/Gender [name] 30 y.o. female  Code Status Code Status History    Date Active Date Inactive Code Status Order ID Comments User Context   05/27/2020 1913 05/28/2020 2248 Full Code [id number]  Merlene Laughter, [place] Inpatient   05/09/2020 2149 05/14/2020 2059 Full Code [id number]  Chotiner, Claudean Severance, MD ED   04/04/2020 1214 04/06/2020 1934 Full Code [id number]  Teddy Spike, DO ED   02/24/2020 1003 02/27/2020 1622 Full Code [id number]  Teddy Spike, DO ED   01/25/2020 0113 01/30/2020 1637 Full Code [id number]  Briscoe Deutscher, MD ED   02/21/2018 1904 02/23/2018 2052 Full Code [id number]  Ike Bene, MD ED   02/21/2018 1833 02/21/2018 1904 Full Code [id number]  Ike Bene, MD ED   01/02/2016 0842 01/05/2016 1438 Full Code [id number]  Lora Paula, MD Inpatient   09/02/2015 1729 09/09/2015 2148 Full Code [id number]  Meredith Pel, NP ED   Advance Care Planning Activity    Questions for Most Recent Historical Code Status (Order [id number])       Home/SNF/Other Home  Chief Complaint Gastroparesis [K31.84]  Level of Care/Admitting Diagnosis ED Disposition    ED Disposition Condition Comment   Admit  Hospital Area: [facility] [[id number]]  Level of Care: Med-Surg [16]  May admit patient to [facility] or [facility] if equivalent level of care is available:: Yes  Covid Evaluation: Asymptomatic Screening Protocol (No Symptoms)  Diagnosis: Gastroparesis [536.3.ICD-9-CM]  Admitting Physician: Tonye Royalty [[id number]]  Attending Physician: Tonye Royalty [[id number]]  Estimated length of stay: past midnight tomorrow  Certification:: I certify this patient will need inpatient services for at least 2 midnights       Medical History Past Medical History:  Diagnosis Date  . Acute H. pylori gastric ulcer   . Diabetes mellitus (HCC)   . Diabetic gastroparesis (HCC)   . Gastroparesis   . GERD (gastroesophageal  reflux disease)   . Hypertension   . Hyperthyroidism     Allergies No Known Allergies  IV Location/Drains/Wounds Patient Lines/Drains/Airways Status    Active Line/Drains/Airways    Name Placement date Placement time Site Days   Peripheral IV 06/05/20 Right Antecubital 06/05/20  1858  Antecubital  less than 1          Labs/Imaging Results for orders placed or performed during the hospital encounter of 06/05/20 (from the past 48 hour(s))  Lipase, blood     Status: None   Collection Time: 06/05/20  5:03 PM  Result Value Ref Range   Lipase 25 11 - 51 U/L    Comment: Performed at [facility], [address], [place], [place] [address]  Comprehensive metabolic panel     Status: Abnormal   Collection Time: 06/05/20  5:03 PM  Result Value Ref Range   Sodium 135 135 - 145 mmol/L   Potassium 4.5 3.5 - 5.1 mmol/L   Chloride 97 (L) 98 - 111 mmol/L   CO2 23 22 - 32 mmol/L   Glucose, Bld 438 (H) 70 - 99 mg/dL    Comment: Glucose reference range applies only to samples taken after fasting for at least 8 hours.   BUN 28 (H) 6 - 20 mg/dL   Creatinine, Ser 7.09 (H) 0.44 - 1.00 mg/dL   Calcium 9.9 8.9 - 62.8 mg/dL   Total Protein 8.6 (H) 6.5 - 8.1 g/dL   Albumin 3.9 3.5 - 5.0 g/dL   AST 25  15 - 41 U/L   ALT 30 0 - 44 U/L   Alkaline Phosphatase 73 38 - 126 U/L   Total Bilirubin 1.2 0.3 - 1.2 mg/dL   GFR, Estimated 54 (L) >60 mL/min    Comment: (NOTE) Calculated using the CKD-EPI Creatinine Equation (2021)    Anion gap 15 5 - 15    Comment: Performed at [facility], [address] [place], [place] [address]  CBC     Status: Abnormal   Collection Time: 06/05/20  5:03 PM  Result Value Ref Range   WBC 8.7 4.0 - 10.5 K/uL   RBC 3.94 3.87 - 5.11 MIL/uL   Hemoglobin 11.6 (L) 12.0 - 15.0 g/dL   HCT 19.4 (L) 17.4 - 08.1 %   MCV 89.3 80.0 - 100.0 fL   MCH 29.4 26.0 - 34.0 pg   MCHC 33.0 30.0 - 36.0 g/dL   RDW 44.8 18.5 - 63.1 %   Platelets 642  (H) 150 - 400 K/uL   nRBC 0.0 0.0 - 0.2 %    Comment: Performed at [facility], [address] [place], [place] [address]  I-Stat beta hCG blood, ED     Status: None   Collection Time: 06/05/20  5:09 PM  Result Value Ref Range   I-stat hCG, quantitative <5.0 <5 mIU/mL   Comment 3            Comment:   GEST. AGE      CONC.  (mIU/mL)   <=1 WEEK        5 - 50     2 WEEKS       50 - 500     3 WEEKS       100 - 10,000     4 WEEKS     1,000 - 30,000        FEMALE AND NON-PREGNANT FEMALE:     LESS THAN 5 mIU/mL   Urinalysis, Routine w reflex microscopic Urine, Clean Catch     Status: Abnormal   Collection Time: 06/05/20  5:25 PM  Result Value Ref Range   Color, Urine YELLOW YELLOW   APPearance HAZY (A) CLEAR   Specific Gravity, Urine 1.020 1.005 - 1.030   pH 5.0 5.0 - 8.0   Glucose, UA >=500 (A) NEGATIVE mg/dL   Hgb urine dipstick SMALL (A) NEGATIVE   Bilirubin Urine NEGATIVE NEGATIVE   Ketones, ur 20 (A) NEGATIVE mg/dL   Protein, ur >=637 (A) NEGATIVE mg/dL   Nitrite NEGATIVE NEGATIVE   Leukocytes,Ua NEGATIVE NEGATIVE   RBC / HPF 0-5 0 - 5 RBC/hpf   WBC, UA 0-5 0 - 5 WBC/hpf   Bacteria, UA NONE SEEN NONE SEEN   Squamous Epithelial / LPF 0-5 0 - 5   Mucus PRESENT    Hyaline Casts, UA PRESENT     Comment: Performed at [facility], [address], [place], [place] [address]   CT ABDOMEN PELVIS W CONTRAST  Result Date: 06/05/2020 CLINICAL DATA:  Abdominal distention EXAM: CT ABDOMEN AND PELVIS WITH CONTRAST TECHNIQUE: Multidetector CT imaging of the abdomen and pelvis was performed using the standard protocol following bolus administration of intravenous contrast. CONTRAST:  OMNIPAQUE IOHEXOL 300 MG/ML  SOLN COMPARISON:  05/09/2020 FINDINGS: Lower chest: No acute abnormality Hepatobiliary: No focal hepatic abnormality. Gallbladder unremarkable. Pancreas: No focal abnormality or ductal dilatation. Spleen: No focal abnormality.  Normal size.  Adrenals/Urinary Tract: No adrenal abnormality. No focal renal abnormality. No  stones or hydronephrosis. Urinary bladder is unremarkable. Stomach/Bowel: Normal appendix. Stomach, large and small bowel grossly unremarkable. Vascular/Lymphatic: No evidence of aneurysm or adenopathy. Reproductive: Uterus and adnexa unremarkable.  No mass. Other: No free fluid or free air. Musculoskeletal: No acute bony abnormality. IMPRESSION: No acute findings in the abdomen or pelvis. Electronically Signed   By: Charlett Nose M.D.   On: 06/05/2020 19:51    Pending Labs [employer] (From admission, onward)          Start     Ordered   Signed and Held  Comprehensive metabolic panel  Tomorrow morning,   R        Signed and Held   Signed and Held  CBC  Tomorrow morning,   R        Signed and Held          Vitals/Pain Today's Vitals   06/05/20 1928 06/05/20 2018 06/05/20 2130 06/05/20 2200  BP:  (!) 155/128 (!) 195/115 (!) 165/104  Pulse:  (!) 133 (!) 125 (!) 120  Resp:  (!) 26 13 13   Temp: 97.6 F (36.4 C)     TempSrc: Oral     SpO2:  100% 99% 99%  PainSc:        Isolation Precautions No active isolations  Medications Medications  sodium chloride 0.9 % bolus 2,000 mL (0 mLs Intravenous Stopped 06/05/20 2145)  HYDROmorphone (DILAUDID) injection 1 mg (1 mg Intravenous Given 06/05/20 1859)  ondansetron (ZOFRAN) injection 4 mg (4 mg Intravenous Given 06/05/20 1859)  iohexol (OMNIPAQUE) 300 MG/ML solution 100 mL (100 mLs Intravenous Contrast Given 06/05/20 1932)  HYDROmorphone (DILAUDID) injection 1 mg (1 mg Intravenous Given 06/05/20 2144)  ondansetron (ZOFRAN) injection 4 mg (4 mg Intravenous Given 06/05/20 2142)    Mobility walks

## 2020-06-05 NOTE — H&P (Signed)
History and Physical   [facility] [place] [facility] [place] Admission History and Physical [employer], D.O.    Patient Name: Kelli Hudson MR#: [id number] Date of Birth: 04/12/1990 Date of Admission: 06/05/2020  Referring MD/NP/PA: Dr. Roderic Palau Primary Care Physician: [name], MD  Chief Complaint:  Chief Complaint  Patient presents with  . Abdominal Pain  . Nausea  . Emesis    HPI: Kelli Hudson is a 30 y.o. female with a known history of diabetes, hypertension, gastroparesis, hyperthyroidism, GERD, peptic ulcer disease depression presents to the emergency department for evaluation of abdominal pain, nausea and vomiting.  Patient was in a usual state of health until 2 hours prior to arrival she reports sudden onset of diffuse cramping abdominal pain radiating into her chest associated with nausea, nonbilious vomiting.  Patient denies fevers/chills, weakness, dizziness, chest pain, shortness of breath, N/V/C/D, abdominal pain, dysuria/frequency, changes in mental status.    Otherwise there has been no change in status. Patient has been taking medication as prescribed and there has been no recent change in medication or diet.  No recent antibiotics.  There has been no recent illness, hospitalizations, travel or sick contacts.    EMS/ED Course: Patient received normal saline, Zofran, Dilaudid. Medical admission has been requested for further management of stroke paresis.  Review of Systems:  CONSTITUTIONAL: No fever/chills, fatigue, weakness, weight gain/loss, headache. EYES: No blurry or double vision. ENT: No tinnitus, postnasal drip, redness or soreness of the oropharynx. RESPIRATORY: No cough, dyspnea, wheeze.  No hemoptysis.  CARDIOVASCULAR: No chest pain, palpitations, syncope, orthopnea. No lower extremity edema.  GASTROINTESTINAL: Positive nausea, vomiting, abdominal pain, diarrhea, constipation.  No hematemesis, melena or  hematochezia. GENITOURINARY: No dysuria, frequency, hematuria. ENDOCRINE: No polyuria or nocturia. No heat or cold intolerance. HEMATOLOGY: No anemia, bruising, bleeding. INTEGUMENTARY: No rashes, ulcers, lesions. MUSCULOSKELETAL: No arthritis, gout, dyspnea. NEUROLOGIC: No numbness, tingling, ataxia, seizure-type activity, weakness. PSYCHIATRIC: No anxiety, depression, insomnia.   Past Medical History:  Diagnosis Date  . Acute H. pylori gastric ulcer   . Diabetes mellitus ([place])   . Diabetic gastroparesis ([place])   . Gastroparesis   . GERD (gastroesophageal reflux disease)   . Hypertension   . Hyperthyroidism     Past Surgical History:  Procedure Laterality Date  . AMPUTATION TOE Left 03/10/2018   Procedure: AMPUTATION FIFTH TOE;  Surgeon: Trula Slade, DPM;  Location: [facility];  Service: Podiatry;  Laterality: Left;  . BIOPSY  01/28/2020   Procedure: BIOPSY;  Surgeon: Otis Brace, MD;  Location: [facility];  Service: Gastroenterology;;  . ESOPHAGOGASTRODUODENOSCOPY N/A 01/28/2020   Procedure: ESOPHAGOGASTRODUODENOSCOPY (EGD);  Surgeon: Otis Brace, MD;  Location: [facility];  Service: Gastroenterology;  Laterality: N/A;  . ESOPHAGOGASTRODUODENOSCOPY (EGD) WITH PROPOFOL Left 09/08/2015   Procedure: ESOPHAGOGASTRODUODENOSCOPY (EGD) WITH PROPOFOL;  Surgeon: Arta Silence, MD;  Location: [facility] ENDOSCOPY;  Service: Endoscopy;  Laterality: Left;  . WISDOM TOOTH EXTRACTION       reports that she has never smoked. She has never used smokeless tobacco. She reports current alcohol use of about 1.0 standard drink of alcohol per week. She reports that she does not use drugs.  No Known Allergies  Family History  Problem Relation Age of Onset  . Lung cancer Mother   . Diabetes Mother   . Bipolar disorder Father   . Liver cancer Maternal Grandfather   . Breast cancer Other        maternal great aunt  . Heart disease Other   .  Ovarian cancer Other         maternal great aunt  . Pancreatic cancer Maternal Uncle   . Prostate cancer Maternal Uncle   . Diabetes Maternal Aunt        x 2  . Kidney disease Other        maternal great aunt    Prior to Admission medications   Medication Sig Start Date End Date Taking? Authorizing Provider  atenolol (TENORMIN) 25 MG tablet Take 1 tablet (25 mg total) by mouth 2 (two) times daily. 01/30/20  Yes Mariel Aloe, MD  famotidine (PEPCID) 20 MG tablet Take 1 tablet (20 mg total) by mouth 2 (two) times daily. 05/14/20 05/14/21 Yes Sheikh, [place], DO  feeding supplement, ENSURE COMPLETE, (ENSURE COMPLETE) LIQD Take 237 mLs by mouth 2 (two) times daily between meals. 03/07/20  Yes Dutch Quint B, FNP  hyoscyamine (LEVBID) 0.375 MG 12 hr tablet Take 1 tablet (0.375 mg total) by mouth every 12 (twelve) hours as needed for cramping (abdominal pain). 05/14/20  Yes Sheikh, Omair Latif, DO  insulin degludec (TRESIBA FLEXTOUCH) 100 UNIT/ML FlexTouch Pen Inject 13 Units into the skin at bedtime.   Yes [provider]  insulin regular (NOVOLIN R) 100 units/mL injection Inject 0-0.15 mLs (0-15 Units total) into the skin 3 (three) times daily before meals. CBG 70 - 120: 0 units CBG 121 - 150: 2 units CBG 151 - 200: 3 units CBG 201 - 250: 5 units CBG 251 - 300: 8 units CBG 301 - 350: 11 units CBG 351 - 400: 15 units 05/14/20  Yes Sheikh, Omair Latif, DO  levalbuterol [facility] HFA) 45 MCG/ACT inhaler Inhale 2 puffs into the lungs every 6 (six) hours as needed for wheezing or shortness of breath. 05/28/20  Yes Little Ishikawa, MD  lisinopril (ZESTRIL) 5 MG tablet Take 5 mg by mouth daily.  10/17/19  Yes [provider]  metoCLOPramide (REGLAN) 5 MG tablet Take 1 tablet (5 mg total) by mouth 3 (three) times daily before meals. 05/21/20  Yes Esterwood, Amy S, PA-C  Multiple Vitamin (MULTIVITAMIN WITH MINERALS) TABS tablet Take 1 tablet by mouth daily.   Yes [provider]  ondansetron  (ZOFRAN ODT) 4 MG disintegrating tablet Take 1 tablet (4 mg total) by mouth every 6 (six) hours as needed for nausea or vomiting. 04/20/20  Yes Esterwood, Amy S, PA-C  pantoprazole (PROTONIX) 40 MG tablet Take 1 tablet (40 mg total) by mouth 2 (two) times daily. 05/14/20  Yes Sheikh, Omair Latif, DO  promethazine (PHENERGAN) 12.5 MG tablet Take 1 tablet (12.5 mg total) by mouth every 6 (six) hours as needed for nausea or vomiting. 05/14/20  Yes Sheikh, Omair Latif, DO  Blood Glucose Monitoring Suppl (TRUE METRIX METER) w/Device KIT 1 Device by Does not apply route 4 (four) times daily. 07/05/19   Libby Maw, MD  glucose blood (TRUE METRIX BLOOD GLUCOSE TEST) test strip Use as instructed 07/05/19   Libby Maw, MD  Insulin Pen Needle 31G X 5 MM MISC 1 Device by Does not apply route QID. For use with insulin pens 07/05/19   Libby Maw, MD  ipratropium (ATROVENT) 0.03 % nasal spray Place 2 sprays into both nostrils 2 (two) times daily as needed for rhinitis. Patient not taking: Reported on 06/05/2020 05/26/20 06/05/20  [provider]  TRUEPLUS LANCETS 28G MISC assist with checking blood sugar TID and qhs 09/10/15   Brayton Caves, PA-C  insulin aspart (  NOVOLOG) 100 UNIT/ML FlexPen Inject 5 Units into the skin 3 (three) times daily with meals. 02/23/18 07/05/19  Donne Hazel, MD    Physical Exam: Vitals:   06/05/20 1915 06/05/20 1928 06/05/20 2018 06/05/20 2130  BP: (!) 176/120  (!) 155/128 (!) 195/115  Pulse: (!) 122  (!) 133 (!) 125  Resp: 11  (!) 26 13  Temp:  97.6 F (36.4 C)    TempSrc:  Oral    SpO2: 96%  100% 99%    GENERAL: 30 y.o.-year-old black female patient, well-developed, well-nourished lying in the bed in no acute distress.  Pleasant and cooperative.   HEENT: Head atraumatic, normocephalic. Pupils equal. Mucus membranes moist. NECK: Supple. No JVD. CHEST: Normal breath sounds bilaterally. No wheezing, rales, rhonchi or crackles. No use of  accessory muscles of respiration.  No reproducible chest wall tenderness.  CARDIOVASCULAR: S1, S2 normal. No murmurs, rubs, or gallops. Cap refill <2 seconds. Pulses intact distally.  ABDOMEN: Soft, nondistended, mild diffuse tenderness.. No rebound, guarding, rigidity. Normoactive bowel sounds present in all four quadrants.  EXTREMITIES: No pedal edema, cyanosis, or clubbing. No calf tenderness or Homan's sign.  NEUROLOGIC: The patient is alert and oriented x 3. Cranial nerves II through XII are grossly intact with no focal sensorimotor deficit. PSYCHIATRIC:  Normal affect, mood, thought content. SKIN: Warm, dry, and intact without obvious rash, lesion, or ulcer.    Labs on Admission:  CBC: Recent Labs  Lab 06/05/20 1703  WBC 8.7  HGB 11.6*  HCT 35.2*  MCV 89.3  PLT 371*   Basic Metabolic Panel: Recent Labs  Lab 06/05/20 1703  NA 135  K 4.5  CL 97*  CO2 23  GLUCOSE 438*  BUN 28*  CREATININE 1.37*  CALCIUM 9.9   GFR: Estimated Creatinine Clearance: 61.1 mL/min (A) (by C-G formula based on SCr of 1.37 mg/dL (H)). Liver Function Tests: Recent Labs  Lab 06/05/20 1703  AST 25  ALT 30  ALKPHOS 73  BILITOT 1.2  PROT 8.6*  ALBUMIN 3.9   Recent Labs  Lab 06/05/20 1703  LIPASE 25   No results for input(s): AMMONIA in the last 168 hours. Coagulation Profile: No results for input(s): INR, PROTIME in the last 168 hours. Cardiac Enzymes: No results for input(s): CKTOTAL, CKMB, CKMBINDEX, TROPONINI in the last 168 hours. BNP (last 3 results) No results for input(s): PROBNP in the last 8760 hours. HbA1C: No results for input(s): HGBA1C in the last 72 hours. CBG: No results for input(s): GLUCAP in the last 168 hours. Lipid Profile: No results for input(s): CHOL, HDL, LDLCALC, TRIG, CHOLHDL, LDLDIRECT in the last 72 hours. Thyroid Function Tests: No results for input(s): TSH, T4TOTAL, FREET4, T3FREE, THYROIDAB in the last 72 hours. Anemia Panel: No results for  input(s): VITAMINB12, FOLATE, FERRITIN, TIBC, IRON, RETICCTPCT in the last 72 hours. Urine analysis:    Component Value Date/Time   COLORURINE YELLOW 06/05/2020 1725   APPEARANCEUR HAZY (A) 06/05/2020 1725   LABSPEC 1.020 06/05/2020 1725   PHURINE 5.0 06/05/2020 1725   GLUCOSEU >=500 (A) 06/05/2020 1725   GLUCOSEU NEGATIVE 05/22/2020 1417   HGBUR SMALL (A) 06/05/2020 1725   BILIRUBINUR NEGATIVE 06/05/2020 1725   KETONESUR 20 (A) 06/05/2020 1725   PROTEINUR >=300 (A) 06/05/2020 1725   UROBILINOGEN 0.2 05/22/2020 1417   NITRITE NEGATIVE 06/05/2020 1725   LEUKOCYTESUR NEGATIVE 06/05/2020 1725   Sepsis Labs: @LABRCNTIP (procalcitonin:4,lacticidven:4) ) Recent Results (from the past 240 hour(s))  Resp Panel by RT-PCR (Flu A&B, Covid)  Nasopharyngeal Swab     Status: Abnormal   Collection Time: 05/27/20  3:06 PM   Specimen: Nasopharyngeal Swab; Nasopharyngeal(NP) swabs in vial transport medium  Result Value Ref Range Status   SARS Coronavirus 2 by RT PCR POSITIVE (A) NEGATIVE Final    Comment: CRITICAL RESULT CALLED TO, READ BACK BY AND VERIFIED WITH: HEATHER BULWIN RN 05/27/20 @1926  BY P.HENDERSON    Influenza A by PCR NEGATIVE NEGATIVE Final   Influenza B by PCR NEGATIVE NEGATIVE Final    Comment: (NOTE) The Xpert Xpress SARS-CoV-2/FLU/RSV plus assay is intended as an aid in the diagnosis of influenza from Nasopharyngeal swab specimens and should not be used as a sole basis for treatment. Nasal washings and aspirates are unacceptable for Xpert Xpress SARS-CoV-2/FLU/RSV testing.  Fact Sheet for Patients: EntrepreneurPulse.com.au  Fact Sheet for Healthcare Providers: IncredibleEmployment.be  This test is not yet approved or cleared by the [place] FDA and has been authorized for detection and/or diagnosis of SARS-CoV-2 by FDA under an Emergency Use Authorization (EUA). This EUA will remain in effect (meaning this test can be used) for  the duration of the COVID-19 declaration under Section 564(b)(1) of the Act, 21 U.S.C. section 360bbb-3(b)(1), unless the authorization is terminated or revoked.  Performed at [facility], [place] [address], [place], [place] [address]   Blood Culture (routine x 2)     Status: None   Collection Time: 05/27/20  3:11 PM   Specimen: BLOOD RIGHT HAND  Result Value Ref Range Status   Specimen Description   Final    BLOOD RIGHT HAND Performed at [facility] [address] [place], [place] [address]    Special Requests   Final    BOTTLES DRAWN AEROBIC AND ANAEROBIC Blood Culture adequate volume Performed at [facility] [address] [place], [place] [address]    Culture   Final    NO GROWTH 5 DAYS Performed at [facility], [place] [address], [place], [place] [address]    Report Status 06/01/2020 FINAL  Final  Blood Culture (routine x 2)     Status: None   Collection Time: 05/27/20  3:59 PM   Specimen: BLOOD RIGHT WRIST  Result Value Ref Range Status   Specimen Description   Final    BLOOD RIGHT WRIST Performed at [facility] [address], [place], [place] [address]    Special Requests   Final    BOTTLES DRAWN AEROBIC AND ANAEROBIC Blood Culture adequate volume Performed at [facility] [place] [address] [place], [place] [address]    Culture   Final    NO GROWTH 5 DAYS Performed at [facility], [place] [address] [place], [place] [address]    Report Status 06/01/2020 FINAL  Final  Urine culture     Status: Abnormal   Collection Time: 05/27/20 11:19 PM   Specimen: In/Out Cath Urine  Result Value Ref Range Status   Specimen Description   Final    IN/OUT CATH URINE Performed at [facility] [address] [place],  [address]    Special Requests   Final    NONE Performed at [facility], [place] [address], [place],   [address]    Culture (A)  Final    40,000 COLONIES/mL MULTIPLE SPECIES PRESENT, SUGGEST RECOLLECTION   Report Status 05/29/2020 FINAL  Final     Radiological Exams on Admission: CT ABDOMEN PELVIS W  CONTRAST  Result Date: 06/05/2020 CLINICAL DATA:  Abdominal distention EXAM: CT ABDOMEN AND PELVIS WITH CONTRAST TECHNIQUE: Multidetector CT imaging of the abdomen and pelvis was performed using the standard protocol following bolus administration of intravenous contrast. CONTRAST:  137m OMNIPAQUE IOHEXOL 300 MG/ML  SOLN COMPARISON:  05/09/2020 FINDINGS: Lower chest: No acute abnormality Hepatobiliary: No focal hepatic abnormality. Gallbladder unremarkable. Pancreas: No focal abnormality or ductal dilatation. Spleen: No focal abnormality.  Normal size. Adrenals/Urinary Tract: No adrenal abnormality. No focal renal abnormality. No stones or hydronephrosis. Urinary bladder is unremarkable. Stomach/Bowel: Normal appendix. Stomach, large and small bowel grossly unremarkable. Vascular/Lymphatic: No evidence of aneurysm or adenopathy. Reproductive: Uterus and adnexa unremarkable.  No mass. Other: No free fluid or free air. Musculoskeletal: No acute bony abnormality. IMPRESSION: No acute findings in the abdomen or pelvis. Electronically Signed   By: KRolm BaptiseM.D.   On: 06/05/2020 19:51    Assessment/Plan  This is a 30y.o. female with a history of diabetes, hypertension, gastroparesis, hyperthyroidism, GERD, peptic ulcer disease depression now being admitted with:  #.  Intractable nausea vomiting secondary to gastroparesis - Admit inpatient - N.p.o. - Pain control - IV Reglan as needed  #. Acute kidney injury dehydration - IV fluids and repeat BMP in AM.  - Avoid nephrotoxic medications  #.  History of hypertension Continue lisinopril and atenolol  #. History of DM - Continue Accu-Cheks every 4 hours with regular insulin sliding scale coverage  #. History of GERD - Continue Pepcid and  Protonix IV   Admission status: Inpatient IV Fluids: Normal saline Diet/Nutrition: N.p.o. Consults called: None DVT Px: SCDs and early ambulation. Code Status: Full Code  Disposition Plan: To home in 1-2 days  All the records are reviewed and case discussed with ED provider. Management plans discussed with the patient and/or family who express understanding and agree with plan of care.  Adjoa Althouse D.O. on 06/05/2020 at 10:01 PM CC: Primary care physician; RHaydee Salter MD   06/05/2020, 10:01 PM

## 2020-06-05 NOTE — ED Notes (Signed)
Pt returned to room from CT

## 2020-06-05 NOTE — ED Triage Notes (Signed)
Emergency Medicine Provider Triage Evaluation Note  Kelli Hudson , a 30 y.o. female  was evaluated in triage.  Pt complains of abdominal pain, started 2 hours ago, feels like a cramping sensation, with a burning sensation in her chest.  She has associated nausea, vomiting, diarrhea.  History of gastroparesis has been able to tolerate p.o..  Review of Systems  Positive: Stomach pain, nausea and vomiting Negative: Denies chest pain, shortness of breath  Physical Exam  BP (!) 156/108 (BP Location: Right Arm)   Pulse (!) 133   Resp 18   SpO2 100%  Gen:   Awake, no distress   HEENT:  Atraumatic  Resp:  Normal effort  Cardiac:  Normal rate  Abd:   Epigastric pain, no guarding, peritoneal signs, rebound tenderness MSK:   Moves extremities without difficulty  Neuro:  Speech clear   Medical Decision Making  Medically screening exam initiated at 5:31 PM.  Appropriate orders placed.  Kelli Hudson was informed that the remainder of the evaluation will be completed by another provider, this initial triage assessment does not replace that evaluation, and the importance of remaining in the ED until their evaluation is complete.  Clinical Impression  Patient has abdominal pain, lab work and imaging ordered, patient need further work-up here in the emergency department   Carroll Sage, PA-C 06/05/20 1732

## 2020-06-05 NOTE — ED Provider Notes (Signed)
[facility] DEPT Provider Note   CSN: [id number] Arrival date & time: 06/05/20  3976     History Chief Complaint  Patient presents with  . Abdominal Pain  . Nausea  . Emesis    Kelli Hudson is a 30 y.o. female.  Patient complains of abdominal pain and vomiting.  Patient has history of diabetes hypertension and gastroparesis.  The history is provided by the patient and medical records. No language interpreter was used.  Abdominal Pain Pain location:  Generalized Pain quality: aching   Pain radiates to:  Does not radiate Pain severity:  Moderate Onset quality:  Sudden Timing:  Constant Progression:  Worsening Chronicity:  New Context: not alcohol use   Associated symptoms: vomiting   Associated symptoms: no chest pain, no cough, no diarrhea, no fatigue and no hematuria   Emesis Associated symptoms: abdominal pain   Associated symptoms: no cough, no diarrhea and no headaches        Past Medical History:  Diagnosis Date  . Acute H. pylori gastric ulcer   . Diabetes mellitus ([place])   . Diabetic gastroparesis ([place])   . Gastroparesis   . GERD (gastroesophageal reflux disease)   . Hypertension   . Hyperthyroidism     Patient Active Problem List   Diagnosis Date Noted  . Sepsis ([place]) 05/27/2020  . Borderline hyperlipidemia 05/23/2020  . Gastroparesis due to DM ([place]) 05/11/2020  . Uncontrolled type 2 diabetes mellitus with gastroparesis ([place]) 05/09/2020  . Tachycardia 05/09/2020  . Normocytic anemia 04/20/2020  . Hyperosmolar hyperglycemic state (HHS) ([place]) 02/24/2020  . Intractable nausea and vomiting   . Intractable abdominal pain 01/24/2020  . H. pylori infection 10/07/2019  . Depression with anxiety 07/05/2019  . Diabetic foot ulcer ([place]) 02/21/2018  . Diabetic polyneuropathy associated with type 2 diabetes mellitus ([place]) 09/24/2017  . Pain in right foot 09/24/2017  . Diabetic ketoacidosis without coma associated with  type 2 diabetes mellitus ([place]) 01/02/2016  . Gastroparesis   . Leukocytosis   . Diabetes mellitus due to underlying condition, uncontrolled, with hyperosmolarity without coma, without long-term current use of insulin ([place])   . Increased frequency of urination 06/14/2011  . UTI (urinary tract infection) 06/14/2011  . Goiter 10/05/2009  . Uncontrolled diabetes mellitus ([place]) 10/05/2009  . Essential hypertension 10/05/2009    Past Surgical History:  Procedure Laterality Date  . AMPUTATION TOE Left 03/10/2018   Procedure: AMPUTATION FIFTH TOE;  Surgeon: Trula Slade, DPM;  Location: [facility];  Service: Podiatry;  Laterality: Left;  . BIOPSY  01/28/2020   Procedure: BIOPSY;  Surgeon: Otis Brace, MD;  Location: [facility];  Service: Gastroenterology;;  . ESOPHAGOGASTRODUODENOSCOPY N/A 01/28/2020   Procedure: ESOPHAGOGASTRODUODENOSCOPY (EGD);  Surgeon: Otis Brace, MD;  Location: [facility];  Service: Gastroenterology;  Laterality: N/A;  . ESOPHAGOGASTRODUODENOSCOPY (EGD) WITH PROPOFOL Left 09/08/2015   Procedure: ESOPHAGOGASTRODUODENOSCOPY (EGD) WITH PROPOFOL;  Surgeon: Arta Silence, MD;  Location: [facility] ENDOSCOPY;  Service: Endoscopy;  Laterality: Left;  . WISDOM TOOTH EXTRACTION       OB History    Gravida  0   Para  0   Term  0   Preterm  0   AB  0   Living  0     SAB  0   IAB  0   Ectopic  0   Multiple  0   Live Births  0           Family History  Problem Relation Age  of Onset  . Lung cancer Mother   . Diabetes Mother   . Bipolar disorder Father   . Liver cancer Maternal Grandfather   . Breast cancer Other        maternal great aunt  . Heart disease Other   . Ovarian cancer Other        maternal great aunt  . Pancreatic cancer Maternal Uncle   . Prostate cancer Maternal Uncle   . Diabetes Maternal Aunt        x 2  . Kidney disease Other        maternal great aunt    Social History   Tobacco Use  . Smoking  status: Never Smoker  . Smokeless tobacco: Never Used  Vaping Use  . Vaping Use: Never used  Substance Use Topics  . Alcohol use: Yes    Alcohol/week: 1.0 standard drink    Types: 1 Shots of liquor per week    Comment: occasional   . Drug use: No    Home Medications Prior to Admission medications   Medication Sig Start Date End Date Taking? Authorizing Provider  atenolol (TENORMIN) 25 MG tablet Take 1 tablet (25 mg total) by mouth 2 (two) times daily. 01/30/20  Yes Mariel Aloe, MD  famotidine (PEPCID) 20 MG tablet Take 1 tablet (20 mg total) by mouth 2 (two) times daily. 05/14/20 05/14/21 Yes Sheikh, Lovelady, DO  feeding supplement, ENSURE COMPLETE, (ENSURE COMPLETE) LIQD Take 237 mLs by mouth 2 (two) times daily between meals. 03/07/20  Yes Dutch Quint B, FNP  hyoscyamine (LEVBID) 0.375 MG 12 hr tablet Take 1 tablet (0.375 mg total) by mouth every 12 (twelve) hours as needed for cramping (abdominal pain). 05/14/20  Yes Sheikh, Omair Latif, DO  insulin degludec (TRESIBA FLEXTOUCH) 100 UNIT/ML FlexTouch Pen Inject 13 Units into the skin at bedtime.   Yes [provider]  insulin regular (NOVOLIN R) 100 units/mL injection Inject 0-0.15 mLs (0-15 Units total) into the skin 3 (three) times daily before meals. CBG 70 - 120: 0 units CBG 121 - 150: 2 units CBG 151 - 200: 3 units CBG 201 - 250: 5 units CBG 251 - 300: 8 units CBG 301 - 350: 11 units CBG 351 - 400: 15 units 05/14/20  Yes Sheikh, Omair Latif, DO  levalbuterol [facility] HFA) 45 MCG/ACT inhaler Inhale 2 puffs into the lungs every 6 (six) hours as needed for wheezing or shortness of breath. 05/28/20  Yes Little Ishikawa, MD  lisinopril (ZESTRIL) 5 MG tablet Take 5 mg by mouth daily.  10/17/19  Yes [provider]  metoCLOPramide (REGLAN) 5 MG tablet Take 1 tablet (5 mg total) by mouth 3 (three) times daily before meals. 05/21/20  Yes Esterwood, Amy S, PA-C  Multiple Vitamin (MULTIVITAMIN WITH MINERALS) TABS  tablet Take 1 tablet by mouth daily.   Yes [provider]  ondansetron (ZOFRAN ODT) 4 MG disintegrating tablet Take 1 tablet (4 mg total) by mouth every 6 (six) hours as needed for nausea or vomiting. 04/20/20  Yes Esterwood, Amy S, PA-C  pantoprazole (PROTONIX) 40 MG tablet Take 1 tablet (40 mg total) by mouth 2 (two) times daily. 05/14/20  Yes Sheikh, Omair Latif, DO  promethazine (PHENERGAN) 12.5 MG tablet Take 1 tablet (12.5 mg total) by mouth every 6 (six) hours as needed for nausea or vomiting. 05/14/20  Yes Sheikh, Omair Latif, DO  Blood Glucose Monitoring Suppl (TRUE METRIX METER) w/Device KIT 1 Device by Does not  apply route 4 (four) times daily. 07/05/19   Libby Maw, MD  glucose blood (TRUE METRIX BLOOD GLUCOSE TEST) test strip Use as instructed 07/05/19   Libby Maw, MD  Insulin Pen Needle 31G X 5 MM MISC 1 Device by Does not apply route QID. For use with insulin pens 07/05/19   Libby Maw, MD  ipratropium (ATROVENT) 0.03 % nasal spray Place 2 sprays into both nostrils 2 (two) times daily as needed for rhinitis. Patient not taking: Reported on 06/05/2020 05/26/20 06/05/20  [provider]  TRUEPLUS LANCETS 28G MISC assist with checking blood sugar TID and qhs 09/10/15   Zettie Pho S, PA-C  insulin aspart (NOVOLOG) 100 UNIT/ML FlexPen Inject 5 Units into the skin 3 (three) times daily with meals. 02/23/18 07/05/19  Donne Hazel, MD    Allergies    Patient has no known allergies.  Review of Systems   Review of Systems  Constitutional: Negative for appetite change and fatigue.  HENT: Negative for congestion, ear discharge and sinus pressure.   Eyes: Negative for discharge.  Respiratory: Negative for cough.   Cardiovascular: Negative for chest pain.  Gastrointestinal: Positive for abdominal pain and vomiting. Negative for diarrhea.  Genitourinary: Negative for frequency and hematuria.  Musculoskeletal: Negative for back pain.  Skin:  Negative for rash.  Neurological: Negative for seizures and headaches.  Psychiatric/Behavioral: Negative for hallucinations.    Physical Exam Updated Vital Signs BP (!) 155/128 (BP Location: Right Arm)   Pulse (!) 133   Temp 97.6 F (36.4 C) (Oral)   Resp (!) 26   SpO2 100%   Physical Exam Vitals and nursing note reviewed.  Constitutional:      Appearance: She is well-developed.  HENT:     Head: Normocephalic.     Mouth/Throat:     Mouth: Mucous membranes are moist.  Eyes:     General: No scleral icterus.    Conjunctiva/sclera: Conjunctivae normal.  Neck:     Thyroid: No thyromegaly.  Cardiovascular:     Rate and Rhythm: Normal rate and regular rhythm.     Heart sounds: No murmur heard. No friction rub. No gallop.   Pulmonary:     Breath sounds: No stridor. No wheezing or rales.  Chest:     Chest wall: No tenderness.  Abdominal:     General: There is no distension.     Tenderness: There is abdominal tenderness. There is no rebound.  Musculoskeletal:        General: Normal range of motion.     Cervical back: Neck supple.  Lymphadenopathy:     Cervical: No cervical adenopathy.  Skin:    Findings: No erythema or rash.  Neurological:     Mental Status: She is alert and oriented to person, place, and time.     Motor: No abnormal muscle tone.     Coordination: Coordination normal.  Psychiatric:        Behavior: Behavior normal.     ED Results / Procedures / Treatments   Labs (all labs ordered are listed, but only abnormal results are displayed) Labs Reviewed  COMPREHENSIVE METABOLIC PANEL - Abnormal; Notable for the following components:      Result Value   Chloride 97 (*)    Glucose, Bld 438 (*)    BUN 28 (*)    Creatinine, Ser 1.37 (*)    Total Protein 8.6 (*)    GFR, Estimated 54 (*)    All other components within normal  limits  CBC - Abnormal; Notable for the following components:   Hemoglobin 11.6 (*)    HCT 35.2 (*)    Platelets 642 (*)    All  other components within normal limits  URINALYSIS, ROUTINE W REFLEX MICROSCOPIC - Abnormal; Notable for the following components:   APPearance HAZY (*)    Glucose, UA >=500 (*)    Hgb urine dipstick SMALL (*)    Ketones, ur 20 (*)    Protein, ur >=300 (*)    All other components within normal limits  LIPASE, BLOOD  I-STAT BETA HCG BLOOD, ED ([facility], [facility], [facility] ONLY)    EKG None  Radiology CT ABDOMEN PELVIS W CONTRAST  Result Date: 06/05/2020 CLINICAL DATA:  Abdominal distention EXAM: CT ABDOMEN AND PELVIS WITH CONTRAST TECHNIQUE: Multidetector CT imaging of the abdomen and pelvis was performed using the standard protocol following bolus administration of intravenous contrast. CONTRAST:  112m OMNIPAQUE IOHEXOL 300 MG/ML  SOLN COMPARISON:  05/09/2020 FINDINGS: Lower chest: No acute abnormality Hepatobiliary: No focal hepatic abnormality. Gallbladder unremarkable. Pancreas: No focal abnormality or ductal dilatation. Spleen: No focal abnormality.  Normal size. Adrenals/Urinary Tract: No adrenal abnormality. No focal renal abnormality. No stones or hydronephrosis. Urinary bladder is unremarkable. Stomach/Bowel: Normal appendix. Stomach, large and small bowel grossly unremarkable. Vascular/Lymphatic: No evidence of aneurysm or adenopathy. Reproductive: Uterus and adnexa unremarkable.  No mass. Other: No free fluid or free air. Musculoskeletal: No acute bony abnormality. IMPRESSION: No acute findings in the abdomen or pelvis. Electronically Signed   By: KRolm BaptiseM.D.   On: 06/05/2020 19:51    Procedures Procedures   Medications Ordered in ED Medications  HYDROmorphone (DILAUDID) injection 1 mg (has no administration in time range)  ondansetron (ZOFRAN) injection 4 mg (has no administration in time range)  sodium chloride 0.9 % bolus 2,000 mL (2,000 mLs Intravenous New Bag/Given 06/05/20 1858)  HYDROmorphone (DILAUDID) injection 1 mg (1 mg Intravenous Given 06/05/20 1859)  ondansetron (ZOFRAN)  injection 4 mg (4 mg Intravenous Given 06/05/20 1859)  iohexol (OMNIPAQUE) 300 MG/ML solution 100 mL (100 mLs Intravenous Contrast Given 06/05/20 1932)    ED Course  I have reviewed the triage vital signs and the nursing notes.  Pertinent labs & imaging results that were available during my care of the patient were reviewed by me and considered in my medical decision making (see chart for details).    MDM Rules/Calculators/A&P                          Patient with abdominal pain vomiting gastroparesis elevated glucose.  She will be admitted to medicine Final Clinical Impression(s) / ED Diagnoses Final diagnoses:  Dehydration  Nausea    Rx / DC Orders ED Discharge Orders    None       ZMilton Ferguson MD 06/05/20 2127

## 2020-06-05 NOTE — ED Triage Notes (Signed)
Patient here from home reporting abd pain, n/v that started today. Reports hx of gastroparesis.

## 2020-06-05 NOTE — ED Notes (Signed)
Unable to obtain temp due to vomiting

## 2020-06-06 ENCOUNTER — Other Ambulatory Visit: Payer: Self-pay

## 2020-06-06 DIAGNOSIS — K3184 Gastroparesis: Secondary | ICD-10-CM

## 2020-06-06 DIAGNOSIS — E860 Dehydration: Secondary | ICD-10-CM

## 2020-06-06 DIAGNOSIS — R110 Nausea: Secondary | ICD-10-CM

## 2020-06-06 LAB — COMPREHENSIVE METABOLIC PANEL
ALT: 27 U/L (ref 0–44)
ALT: 28 U/L (ref 0–44)
AST: 22 U/L (ref 15–41)
AST: 22 U/L (ref 15–41)
Albumin: 3.4 g/dL — ABNORMAL LOW (ref 3.5–5.0)
Albumin: 3.9 g/dL (ref 3.5–5.0)
Alkaline Phosphatase: 70 U/L (ref 38–126)
Alkaline Phosphatase: 75 U/L (ref 38–126)
Anion gap: 16 — ABNORMAL HIGH (ref 5–15)
Anion gap: 17 — ABNORMAL HIGH (ref 5–15)
BUN: 25 mg/dL — ABNORMAL HIGH (ref 6–20)
BUN: 25 mg/dL — ABNORMAL HIGH (ref 6–20)
CO2: 21 mmol/L — ABNORMAL LOW (ref 22–32)
CO2: 20 mmol/L — ABNORMAL LOW (ref 22–32)
Calcium: 9.5 mg/dL (ref 8.9–10.3)
Calcium: 9.7 mg/dL (ref 8.9–10.3)
Chloride: 100 mmol/L (ref 98–111)
Chloride: 100 mmol/L (ref 98–111)
Creatinine, Ser: 1.13 mg/dL — ABNORMAL HIGH (ref 0.44–1.00)
Creatinine, Ser: 1.12 mg/dL — ABNORMAL HIGH (ref 0.44–1.00)
GFR, Estimated: >60 mL/min (ref >60)
GFR, Estimated: >60 mL/min (ref >60)
Glucose, Bld: 460 mg/dL — ABNORMAL HIGH (ref 70–99)
Glucose, Bld: 446 mg/dL — ABNORMAL HIGH (ref 70–99)
Potassium: 4.3 mmol/L (ref 3.5–5.1)
Potassium: 4.5 mmol/L (ref 3.5–5.1)
Sodium: 137 mmol/L (ref 135–145)
Sodium: 137 mmol/L (ref 135–145)
Total Bilirubin: 1.1 mg/dL (ref 0.3–1.2)
Total Bilirubin: 1.1 mg/dL (ref 0.3–1.2)
Total Protein: 8 g/dL (ref 6.5–8.1)
Total Protein: 8.4 g/dL — ABNORMAL HIGH (ref 6.5–8.1)

## 2020-06-06 LAB — GLUCOSE, CAPILLARY
Glucose-Capillary: 386 mg/dL — ABNORMAL HIGH (ref 70–99)
Glucose-Capillary: 319 mg/dL — ABNORMAL HIGH (ref 70–99)
Glucose-Capillary: 198 mg/dL — ABNORMAL HIGH (ref 70–99)
Glucose-Capillary: 241 mg/dL — ABNORMAL HIGH (ref 70–99)
Glucose-Capillary: 243 mg/dL — ABNORMAL HIGH (ref 70–99)

## 2020-06-06 LAB — CBC
HCT: 36.4 % (ref 36.0–46.0)
Hemoglobin: 11.9 g/dL — ABNORMAL LOW (ref 12.0–15.0)
MCH: 29.7 pg (ref 26.0–34.0)
MCHC: 32.7 g/dL (ref 30.0–36.0)
MCV: 90.8 fL (ref 80.0–100.0)
Platelets: 596 10*3/uL — ABNORMAL HIGH (ref 150–400)
RBC: 4.01 MIL/uL (ref 3.87–5.11)
RDW: 13.4 % (ref 11.5–15.5)
WBC: 11.6 10*3/uL — ABNORMAL HIGH (ref 4.0–10.5)
nRBC: 0 % (ref 0.0–0.2)

## 2020-06-06 LAB — BASIC METABOLIC PANEL
Anion gap: 12 (ref 5–15)
BUN: 28 mg/dL — ABNORMAL HIGH (ref 6–20)
CO2: 24 mmol/L (ref 22–32)
Calcium: 10.3 mg/dL (ref 8.9–10.3)
Chloride: 112 mmol/L — ABNORMAL HIGH (ref 98–111)
Creatinine, Ser: 1.08 mg/dL — ABNORMAL HIGH (ref 0.44–1.00)
GFR, Estimated: >60 mL/min (ref >60)
Glucose, Bld: 201 mg/dL — ABNORMAL HIGH (ref 70–99)
Potassium: 4.1 mmol/L (ref 3.5–5.1)
Sodium: 148 mmol/L — ABNORMAL HIGH (ref 135–145)

## 2020-06-06 LAB — CBG MONITORING, ED: Glucose-Capillary: 408 mg/dL — ABNORMAL HIGH (ref 70–99)

## 2020-06-06 MED ORDER — INSULIN GLARGINE 100 UNIT/ML ~~LOC~~ SOLN
10.0000 [IU] | Freq: Every day | SUBCUTANEOUS | Status: DC
  Administered 2020-06-06: 10 [IU] via SUBCUTANEOUS
  Filled 2020-06-06 (×2): qty 0.1

## 2020-06-06 MED ORDER — PANTOPRAZOLE SODIUM 40 MG IV SOLR
40.0000 mg | Freq: Two times a day (BID) | INTRAVENOUS | Status: AC
  Administered 2020-06-06 – 2020-06-08 (×5): 40 mg via INTRAVENOUS
  Filled 2020-06-06 (×5): qty 40

## 2020-06-06 MED ORDER — SODIUM CHLORIDE 0.9 % IV SOLN: 200.0000 mg | Freq: Once | INTRAVENOUS | Status: DC

## 2020-06-06 MED ORDER — SODIUM CHLORIDE 0.9 % IV SOLN: 100.0000 mg | Freq: Every day | INTRAVENOUS | Status: DC

## 2020-06-06 MED ORDER — ASCORBIC ACID 500 MG PO TABS
500.0000 mg | ORAL_TABLET | Freq: Every day | ORAL | Status: DC
  Administered 2020-06-06: 500 mg via ORAL
  Filled 2020-06-06: qty 1

## 2020-06-06 MED ORDER — ZINC SULFATE 220 (50 ZN) MG PO CAPS
220.0000 mg | ORAL_CAPSULE | Freq: Every day | ORAL | Status: DC
  Administered 2020-06-06: 220 mg via ORAL
  Filled 2020-06-06: qty 1

## 2020-06-06 MED ORDER — METOCLOPRAMIDE HCL 5 MG PO TABS
5.0000 mg | ORAL_TABLET | Freq: Three times a day (TID) | ORAL | Status: DC
  Administered 2020-06-06 – 2020-06-09 (×12): 5 mg via ORAL
  Filled 2020-06-06 (×12): qty 1

## 2020-06-06 MED ORDER — SODIUM CHLORIDE 0.9 % IV SOLN
250.0000 mg | Freq: Three times a day (TID) | INTRAVENOUS | Status: AC
  Administered 2020-06-06 – 2020-06-08 (×8): 250 mg via INTRAVENOUS
  Filled 2020-06-06 (×12): qty 5

## 2020-06-06 MED ORDER — INSULIN ASPART 100 UNIT/ML ~~LOC~~ SOLN
0.0000 [IU] | SUBCUTANEOUS | Status: DC
  Administered 2020-06-06 (×2): 3 [IU] via SUBCUTANEOUS
  Administered 2020-06-06: 2 [IU] via SUBCUTANEOUS
  Administered 2020-06-07: 3 [IU] via SUBCUTANEOUS
  Administered 2020-06-07: 2 [IU] via SUBCUTANEOUS
  Administered 2020-06-07: 9 [IU] via SUBCUTANEOUS
  Administered 2020-06-07: 3 [IU] via SUBCUTANEOUS

## 2020-06-06 NOTE — Plan of Care (Signed)

## 2020-06-06 NOTE — Plan of Care (Signed)
  Problem: Education: Goal: Knowledge of General Education information will improve Description: Including pain rating scale, medication(s)/side effects and non-pharmacologic comfort measures Outcome: Progressing   Problem: Health Behavior/Discharge Planning: Goal: Ability to manage health-related needs will improve Outcome: Progressing   Problem: Clinical Measurements: Goal: Ability to maintain clinical measurements within normal limits will improve Outcome: Progressing Goal: Will remain free from infection Outcome: Progressing Goal: Diagnostic test results will improve Outcome: Progressing   Problem: Education: Goal: Knowledge of General Education information will improve Description: Including pain rating scale, medication(s)/side effects and non-pharmacologic comfort measures Outcome: Progressing   Problem: Health Behavior/Discharge Planning: Goal: Ability to manage health-related needs will improve Outcome: Progressing   Problem: Clinical Measurements: Goal: Ability to maintain clinical measurements within normal limits will improve Outcome: Progressing   Problem: Clinical Measurements: Goal: Will remain free from infection Outcome: Progressing   Problem: Clinical Measurements: Goal: Diagnostic test results will improve Outcome: Progressing   

## 2020-06-06 NOTE — Progress Notes (Signed)
Inpatient Diabetes Program Recommendations  AACE/ADA: New Consensus Statement on Inpatient Glycemic Control (2015)  Target Ranges:  Prepandial:   less than 140 mg/dL      Peak postprandial:   less than 180 mg/dL (1-2 hours)      Critically ill patients:  140 - 180 mg/dL   Lab Results  Component Value Date   GLUCAP 386 (H) 06/06/2020   HGBA1C 11.5 (H) 05/09/2020    Review of Glycemic Control Results for Kelli, Hudson (MRN [id number]) as of 06/06/2020 08:12  Ref. Range 06/06/2020 00:12 06/06/2020 04:21  Glucose-Capillary Latest Ref Range: 70 - 99 mg/dL 518 (H) 841 (H)  Results for Kelli, Hudson (MRN [id number]) as of 06/06/2020 08:12  Ref. Range 06/06/2020 04:01  Sodium Latest Ref Range: 135 - 145 mmol/L 137  Potassium Latest Ref Range: 3.5 - 5.1 mmol/L 4.5  Chloride Latest Ref Range: 98 - 111 mmol/L 100  CO2 Latest Ref Range: 22 - 32 mmol/L 20 (L)  Glucose Latest Ref Range: 70 - 99 mg/dL 109 (H)  BUN Latest Ref Range: 6 - 20 mg/dL 25 (H)  Creatinine Latest Ref Range: 0.44 - 1.00 mg/dL 3.23 (H)  Calcium Latest Ref Range: 8.9 - 10.3 mg/dL 9.7  Anion gap Latest Ref Range: 5 - 15  17 (H)  Alkaline Phosphatase Latest Ref Range: 38 - 126 U/L 75  Albumin Latest Ref Range: 3.5 - 5.0 g/dL 3.9  AST Latest Ref Range: 15 - 41 U/L 22  ALT Latest Ref Range: 0 - 44 U/L 28  Total Protein Latest Ref Range: 6.5 - 8.1 g/dL 8.4 (H)  Total Bilirubin Latest Ref Range: 0.3 - 1.2 mg/dL 1.1  GFR, Estimated Latest Ref Range: >60 mL/min >60   Diabetes history: DM 1 Outpatient Diabetes medications: Tresiba 13 units daily, Novolin R 0-15 units tid with meals Current orders for Inpatient glycemic control:  Novolog moderate q 4 hours Inpatient Diabetes Program Recommendations:   Note elevated AG this AM.  Patient has hx. Of Type 1 DM.  Please consider adding Lantus 10 units daily and reduce Novolog correction to sensitive q 4 hours. Consider rechecking BMP today.  Will follow.  Thanks, Beryl Meager, RN, BC-ADM Inpatient Diabetes Coordinator Pager [phone] (8a-5p)

## 2020-06-06 NOTE — Progress Notes (Signed)
Kelli Hudson  BOF:[id number] DOB: July 29, 1990 DOA: 06/05/2020 PCP: Loyola Mast, MD    Brief Narrative:  30 year old with a history of DM, HTN, gastroparesis, hypothyroidism, GERD, PUD, and depression who presented to the ED with the abrupt onset of abdominal pain, nausea, and vomiting 2 hours prior to arrival.  Date of 1st Positive COVID Test:  05/27/20  Vaccination Status: Vaccine x2 - no booster   COVID-19 specific Treatment: Remdesivir 4/10 > 4/11  Consultants:  None  Code Status: FULL CODE  Antimicrobials:  None  DVT prophylaxis: SCDs  Subjective: Reports ongoing abdominal pain which is not responding to current therapy.  Denies shortness of breath chest pain.  Reports ongoing nausea.  No vomiting thus far.  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 (DM1) - dosed w/ Remdesivir during prior admission - initial positive test was 4/10 during admission w/ transient fevers and sob but no hypoxia and CXR not entirely c/w CoViD type infiltrate - 10 day isolation period appropriate, with 4/20 being the 10th day of isolation - pt presently has no sx attributable to an active CoViD infxn   Acute kidney injury Creatinine slowly improving with volume resuscitation  Recent Labs  Lab 06/05/20 1703 06/06/20 0031 06/06/20 0401  CREATININE 1.37* 1.13* 1.12*    HTN Likely being  exacerbated by pain -avoid overexuberant correction acutely - follow trend   GERD Resume usual home PPI dose - hold on additional H2 blocker for now   Family Communication:  Status is: Inpatient  Remains inpatient appropriate because:Inpatient level of care appropriate due to severity of illness   Dispo: The patient is from: Home              Anticipated d/c is to: Home              Patient currently is not medically stable to d/c.   Difficult to place patient No   Objective: Blood pressure 139/82, pulse (!) 124, temperature 98.3 F (36.8 C), temperature source Oral, resp. rate 20, height 5\' 8"  (1.727 m), weight 69.1 kg, SpO2 100 %. No intake or output data in the 24 hours ending 06/06/20 1021 Filed Weights   06/06/20 0133  Weight: 69.1 kg    Examination: General: No acute respiratory distress Lungs: Clear to auscultation bilaterally without wheezes or crackles Cardiovascular: Regular rate and rhythm without murmur gallop or rub normal S1 and S2 Abdomen: Tender diffusely, bowel sounds hypoactive, no mass, no rebound Extremities: No significant cyanosis, clubbing, or edema bilateral lower extremities  CBC: Recent Labs  Lab 06/05/20 1703 06/06/20 0401  WBC 8.7 11.6*  HGB 11.6* 11.9*  HCT 35.2* 36.4  MCV 89.3 90.8  PLT 642* 596*   Basic Metabolic Panel: Recent Labs  Lab 06/05/20 1703 06/06/20 0031 06/06/20 0401  NA 135 137 137  K 4.5 4.3 4.5  CL 97* 100 100  CO2 23 21* 20*  GLUCOSE 438* 460* 446*  BUN 28* 25* 25*  CREATININE 1.37* 1.13* 1.12*  CALCIUM 9.9 9.5 9.7   GFR: Estimated Creatinine Clearance: 74.8 mL/min (A) (by C-G formula based on SCr of 1.12 mg/dL (H)).  Liver Function Tests: Recent Labs  Lab 06/05/20 1703 06/06/20 0031 06/06/20 0401  AST 25 22 22   ALT 30 27 28   ALKPHOS 73 70 75  BILITOT 1.2 1.1 1.1  PROT 8.6* 8.0 8.4*  ALBUMIN 3.9 3.4* 3.9   Recent Labs  Lab 06/05/20 1703  LIPASE 25    HbA1C: Hgb A1c MFr Bld  Date/Time Value  Ref Range Status  05/09/2020 05:28 PM 11.5 (H) 4.8 - 5.6 % Final    Comment:    (NOTE) Pre diabetes:          5.7%-6.4%  Diabetes:              >6.4%  Glycemic control for   <7.0% adults with diabetes   02/24/2020 09:34 AM 10.8 (H) 4.8 - 5.6 % Final    Comment:    (NOTE) Pre diabetes:          5.7%-6.4%  Diabetes:              >6.4%  Glycemic control for   <7.0% adults with diabetes     CBG: Recent Labs  Lab 06/06/20 0012 06/06/20 0421 06/06/20 0814  GLUCAP 408* 386* 319*    Recent Results (from the past 240 hour(s))  Resp Panel by RT-PCR (Flu A&B, Covid) Nasopharyngeal Swab     Status: Abnormal   Collection Time: 05/27/20  3:06 PM   Specimen: Nasopharyngeal Swab; Nasopharyngeal(NP) swabs in vial transport medium  Result Value Ref Range Status   SARS Coronavirus 2 by RT PCR POSITIVE (A) NEGATIVE Final    Comment: CRITICAL RESULT CALLED TO, READ BACK BY AND VERIFIED WITH: HEATHER BULWIN RN 05/27/20 @1926  BY P.HENDERSON    Influenza A by PCR NEGATIVE NEGATIVE Final   Influenza B by PCR NEGATIVE NEGATIVE Final    Comment: (NOTE) The Xpert Xpress SARS-CoV-2/FLU/RSV plus assay is intended as an aid in the diagnosis of influenza from Nasopharyngeal swab specimens and should not be used as a sole basis for treatment. Nasal washings and aspirates are unacceptable for Xpert Xpress SARS-CoV-2/FLU/RSV testing.  Fact Sheet for Patients: BloggerCourse.comhttps://www.fda.gov/media/152166/download  Fact Sheet for Healthcare Providers: SeriousBroker.ithttps://www.fda.gov/media/152162/download  This test is not yet approved or cleared by the [place] FDA and has been authorized for detection and/or diagnosis of SARS-CoV-2 by FDA under an Emergency Use Authorization (EUA). This EUA will remain in effect (meaning this test can be used) for the duration of the COVID-19 declaration under Section 564(b)(1) of the Act, 21 U.S.C. section 360bbb-3(b)(1), unless the authorization is terminated  or revoked.  Performed at [facility], [address] [place], [place] [address]   Blood Culture (routine x 2)     Status: None   Collection Time: 05/27/20  3:11 PM   Specimen: BLOOD RIGHT HAND  Result Value Ref Range Status   Specimen Description   Final    BLOOD RIGHT HAND Performed at [facility], [address] [place], [place] [address]    Special Requests   Final    BOTTLES DRAWN AEROBIC AND ANAEROBIC Blood Culture adequate volume Performed at [facility], [address] [place], [place] [address]    Culture   Final    NO GROWTH 5 DAYS Performed at [facility], [address] [place], [place] [address]  Report Status 06/01/2020 FINAL  Final  Blood Culture (routine x 2)     Status: None   Collection Time: 05/27/20  3:59 PM   Specimen: BLOOD RIGHT WRIST  Result Value Ref Range Status   Specimen Description   Final    BLOOD RIGHT WRIST Performed at [facility], [address] [place], [place] [address]    Special Requests   Final    BOTTLES DRAWN AEROBIC AND ANAEROBIC Blood Culture adequate volume Performed at [facility], [address] [place], [place] [address]    Culture   Final    NO GROWTH 5 DAYS Performed at [facility], [address], [place], [place] [address]    Report Status 06/01/2020 FINAL  Final  Urine culture     Status: Abnormal   Collection Time: 05/27/20 11:19 PM   Specimen: In/Out Cath Urine  Result Value Ref Range Status   Specimen Description   Final    IN/OUT CATH URINE Performed at [facility], [address] [place], [place] [address]    Special Requests   Final    NONE Performed at [facility], [address], [place], [place] [address]    Culture (A)  Final    40,000 COLONIES/mL MULTIPLE SPECIES PRESENT, SUGGEST RECOLLECTION   Report Status 05/29/2020 FINAL  Final  Resp  Panel by RT-PCR (Flu A&B, Covid) Nasopharyngeal Swab     Status: Abnormal   Collection Time: 06/05/20 10:53 PM   Specimen: Nasopharyngeal Swab; Nasopharyngeal(NP) swabs in vial transport medium  Result Value Ref Range Status   SARS Coronavirus 2 by RT PCR POSITIVE (A) NEGATIVE Final    Comment: RESULT CALLED TO, READ BACK BY AND VERIFIED WITH: TABITHA, RN @ 2350 ON 06/05/20 C VARNER (NOTE) SARS-CoV-2 target nucleic acids are DETECTED.  The SARS-CoV-2 RNA is generally detectable in upper respiratory specimens during the acute phase of infection. Positive results are indicative of the presence of the identified virus, but do not rule out bacterial infection or co-infection with other pathogens not detected by the test. Clinical correlation with patient history and other diagnostic information is necessary to determine patient infection status. The expected result is Negative.  Fact Sheet for Patients: BloggerCourse.com  Fact Sheet for Healthcare Providers: SeriousBroker.it  This test is not yet approved or cleared by the [place] FDA and  has been authorized for detection and/or diagnosis of SARS-CoV-2 by FDA under an Emergency Use Authorization (EUA).  This EUA will remain in effect (meaning this test c an be used) for the duration of  the COVID-19 declaration under Section 564(b)(1) of the Act, 21 U.S.C. section 360bbb-3(b)(1), unless the authorization is terminated or revoked sooner.     Influenza A by PCR NEGATIVE NEGATIVE Final   Influenza B by PCR NEGATIVE NEGATIVE Final    Comment: (NOTE) The Xpert Xpress SARS-CoV-2/FLU/RSV plus assay is intended as an aid in the diagnosis of influenza from Nasopharyngeal swab specimens and should not be used as a sole basis for treatment. Nasal washings and aspirates are unacceptable for Xpert Xpress SARS-CoV-2/FLU/RSV testing.  Fact Sheet for  Patients: BloggerCourse.com  Fact Sheet for Healthcare Providers: SeriousBroker.it  This test is not yet approved or cleared by the [place] FDA and has been authorized for detection and/or diagnosis of SARS-CoV-2 by FDA under an Emergency Use Authorization (EUA). This EUA will remain in effect (meaning this test can be used) for the duration of the  COVID-19 declaration under Section 564(b)(1) of the Act, 21 U.S.C. section 360bbb-3(b)(1), unless the authorization is terminated or revoked.  Performed at [facility], [address], [place], [place] [address]      Scheduled Meds: . atenolol  25 mg Oral BID  . famotidine  20 mg Oral BID  . insulin aspart  0-9 Units Subcutaneous Q4H  . insulin glargine  10 Units Subcutaneous Daily  . lisinopril  5 mg Oral Daily  . pantoprazole (PROTONIX) IV  40 mg Intravenous Q24H   Continuous Infusions: . sodium chloride 150 mL/hr at 06/06/20 0913     LOS: 1 day   [name], MD [facility] Office  [phone] Pager - Text Page per Loretha Stapler  If 7PM-7AM, please contact night-coverage per Amion 06/06/2020, 10:21 AM

## 2020-06-06 NOTE — ED Notes (Addendum)
RN sent message via internal chat and attempted to notify MD of CBG [phone]

## 2020-06-06 NOTE — Progress Notes (Signed)
Pt c/o intense abd pain 8-9/10 all over abd region. Crushing, stabbing, pressure, intense, pain. medicated as ordered. SRP, RN

## 2020-06-06 NOTE — Progress Notes (Signed)
Pt MEWS Yellow earlier in the shift, pt with increase HR, with pain. Pain control improved after Levsin dose. She was able to eat 2 cups of Chicken broth and tolerated well without nausea. [facility] and voided 600 cc clear yellow urine. Pt pain 7/10. Will cont to monitor. SRP, RN

## 2020-06-07 LAB — COMPREHENSIVE METABOLIC PANEL
ALT: 19 U/L (ref 0–44)
AST: 19 U/L (ref 15–41)
Albumin: 3.1 g/dL — ABNORMAL LOW (ref 3.5–5.0)
Alkaline Phosphatase: 66 U/L (ref 38–126)
Anion gap: 11 (ref 5–15)
BUN: 19 mg/dL (ref 6–20)
CO2: 21 mmol/L — ABNORMAL LOW (ref 22–32)
Calcium: 9.7 mg/dL (ref 8.9–10.3)
Chloride: 106 mmol/L (ref 98–111)
Creatinine, Ser: 0.79 mg/dL (ref 0.44–1.00)
GFR, Estimated: >60 mL/min (ref >60)
Glucose, Bld: 247 mg/dL — ABNORMAL HIGH (ref 70–99)
Potassium: 3.9 mmol/L (ref 3.5–5.1)
Sodium: 138 mmol/L (ref 135–145)
Total Bilirubin: 0.8 mg/dL (ref 0.3–1.2)
Total Protein: 7 g/dL (ref 6.5–8.1)

## 2020-06-07 LAB — GLUCOSE, CAPILLARY
Glucose-Capillary: 162 mg/dL — ABNORMAL HIGH (ref 70–99)
Glucose-Capillary: 188 mg/dL — ABNORMAL HIGH (ref 70–99)
Glucose-Capillary: 195 mg/dL — ABNORMAL HIGH (ref 70–99)
Glucose-Capillary: 234 mg/dL — ABNORMAL HIGH (ref 70–99)
Glucose-Capillary: 244 mg/dL — ABNORMAL HIGH (ref 70–99)
Glucose-Capillary: 359 mg/dL — ABNORMAL HIGH (ref 70–99)
Glucose-Capillary: 94 mg/dL (ref 70–99)

## 2020-06-07 LAB — CBC
HCT: 33.7 % — ABNORMAL LOW (ref 36.0–46.0)
Hemoglobin: 11 g/dL — ABNORMAL LOW (ref 12.0–15.0)
MCH: 30 pg (ref 26.0–34.0)
MCHC: 32.6 g/dL (ref 30.0–36.0)
MCV: 91.8 fL (ref 80.0–100.0)
Platelets: 552 10*3/uL — ABNORMAL HIGH (ref 150–400)
RBC: 3.67 MIL/uL — ABNORMAL LOW (ref 3.87–5.11)
RDW: 13.6 % (ref 11.5–15.5)
WBC: 20.5 10*3/uL — ABNORMAL HIGH (ref 4.0–10.5)
nRBC: 0 % (ref 0.0–0.2)

## 2020-06-07 LAB — MAGNESIUM: Magnesium: 1.6 mg/dL — ABNORMAL LOW (ref 1.7–2.4)

## 2020-06-07 MED ORDER — INSULIN ASPART 100 UNIT/ML ~~LOC~~ SOLN
4.0000 [IU] | Freq: Three times a day (TID) | SUBCUTANEOUS | Status: DC
  Administered 2020-06-07 – 2020-06-09 (×4): 4 [IU] via SUBCUTANEOUS

## 2020-06-07 MED ORDER — INSULIN ASPART 100 UNIT/ML ~~LOC~~ SOLN
0.0000 [IU] | Freq: Three times a day (TID) | SUBCUTANEOUS | Status: DC
  Administered 2020-06-07: 2 [IU] via SUBCUTANEOUS
  Administered 2020-06-08: 3 [IU] via SUBCUTANEOUS
  Administered 2020-06-08 – 2020-06-09 (×3): 2 [IU] via SUBCUTANEOUS

## 2020-06-07 MED ORDER — LISINOPRIL 10 MG PO TABS
10.0000 mg | ORAL_TABLET | Freq: Every day | ORAL | Status: DC
  Administered 2020-06-08 – 2020-06-09 (×2): 10 mg via ORAL
  Filled 2020-06-07 (×2): qty 1

## 2020-06-07 MED ORDER — CLONIDINE HCL 0.1 MG PO TABS
0.1000 mg | ORAL_TABLET | Freq: Three times a day (TID) | ORAL | Status: DC
  Administered 2020-06-07 – 2020-06-08 (×4): 0.1 mg via ORAL
  Filled 2020-06-07 (×4): qty 1

## 2020-06-07 MED ORDER — MAGNESIUM SULFATE 4 GM/100ML IV SOLN
4.0000 g | Freq: Once | INTRAVENOUS | Status: AC
  Administered 2020-06-07: 4 g via INTRAVENOUS
  Filled 2020-06-07: qty 100

## 2020-06-07 MED ORDER — INSULIN GLARGINE 100 UNIT/ML ~~LOC~~ SOLN
16.0000 [IU] | Freq: Every day | SUBCUTANEOUS | Status: DC
  Administered 2020-06-07 – 2020-06-09 (×3): 16 [IU] via SUBCUTANEOUS
  Filled 2020-06-07 (×3): qty 0.16

## 2020-06-07 NOTE — Plan of Care (Signed)
  Problem: Education: Goal: Knowledge of General Education information will improve Description: Including pain rating scale, medication(s)/side effects and non-pharmacologic comfort measures Outcome: Not Progressing   Problem: Health Behavior/Discharge Planning: Goal: Ability to manage health-related needs will improve Outcome: Not Progressing   Problem: Clinical Measurements: Goal: Ability to maintain clinical measurements within normal limits will improve Outcome: Not Progressing Goal: Will remain free from infection Outcome: Not Progressing Goal: Diagnostic test results will improve Outcome: Not Progressing Goal: Respiratory complications will improve Outcome: Not Progressing Goal: Cardiovascular complication will be avoided Outcome: Not Progressing   Problem: Activity: Goal: Risk for activity intolerance will decrease Outcome: Not Progressing   Problem: Health Behavior/Discharge Planning: Goal: Ability to manage health-related needs will improve Outcome: Not Progressing   Problem: Clinical Measurements: Goal: Cardiovascular complication will be avoided Outcome: Not Progressing   Problem: Clinical Measurements: Goal: Respiratory complications will improve Outcome: Not Progressing   Problem: Clinical Measurements: Goal: Diagnostic test results will improve Outcome: Not Progressing   Problem: Clinical Measurements: Goal: Ability to maintain clinical measurements within normal limits will improve Outcome: Not Progressing   Problem: Health Behavior/Discharge Planning: Goal: Ability to manage health-related needs will improve Outcome: Not Progressing   Problem: Health Behavior/Discharge Planning: Goal: Ability to manage health-related needs will improve Outcome: Not Progressing

## 2020-06-07 NOTE — Consult Note (Signed)
   [facility] Inpatient Consult   06/07/2020  Cortnie Ringel Nov 02, 1990 [id number]   Coverage partner for [facility] Liaison  [facility] [[facility]]  Accountable Care Organization [ACO] Patient: [facility]  Primary Care Provider: [name], MD, [facility] Embedded CCM  Patient was screened for less than 30 days readmission and high risk score for unplanned readmission risk.  Patient was reviewed for [facility] [[facility]]  Care Management services and she is recently referred to. Patient will have the transition of care call conducted by the primary care provider. This patient is also in an Embedded practice which has a chronic disease management Embedded Care Management team.  Plan: Notification will be sent to the [facility] Embedded Care Management to make aware of any needs. Also, notified inpatient Transition of Care RNCM of patient has been referred in the Embedded Chronic Care Management program with PCP.  Please contact for further questions,  Charlesetta Shanks, RN BSN CCM [facility]  [phone] business mobile phone Toll free office [phone]  Fax number: [phone] [email or web address]

## 2020-06-07 NOTE — Progress Notes (Signed)
Airborne, Contact Precaution discontinued per MD order. SRP, RN

## 2020-06-07 NOTE — Plan of Care (Signed)

## 2020-06-07 NOTE — Progress Notes (Signed)
Kelli Hudson  IRW:[id number] DOB: December 31, 1990 DOA: 06/05/2020 PCP: Loyola Mast, MD    Brief Narrative:  30yo with a history of DM, HTN, gastroparesis, hypothyroidism, GERD, PUD, and depression who presented to the ED with the abrupt onset of abdominal pain, nausea, and vomiting 2 hours prior to arrival.  Date of 1st Positive COVID Test:  05/27/20  Vaccination Status: Vaccine x2 - no booster   COVID-19 specific Treatment: Remdesivir 4/10 > 4/11  Consultants:  None  Code Status: FULL CODE  Antimicrobials:  None  DVT prophylaxis: SCDs  Subjective: Blood pressure remains elevated and patient remains tachycardic.  She is afebrile with stable oxygen saturations at 100.  CBGs remain quite variable.  Magnesium is noted to be low at 1.6. She feels her sx are beginning to improve, but she is not yet ready to advance her diet. She denies cp, f/c, or sob.   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 (DM1) - dosed w/ Remdesivir during prior admission - initial positive test was 4/10 during admission w/ transient fevers and sob but no hypoxia and CXR not entirely c/w CoViD type infiltrate - 10 day isolation period appropriate, with 4/20 being the 10th day of isolation - pt presently has no sx attributable to an active CoViD infxn - d/c isolation today   Acute kidney injury Creatinine has normalized with volume expansion  Recent Labs  Lab 06/05/20 1703 06/06/20 0031 06/06/20 0401  06/06/20 1341 06/07/20 0422  CREATININE 1.37* 1.13* 1.12* 1.08* 0.79   Hypomagnesemia Replace via IV and follow-up in a.m.  HTN Blood pressure vacillating and is likely being exacerbated by pain - add short-term clonidine  GERD Resume usual home PPI dose - hold on additional H2 blocker for now   Family Communication:  Status is: Inpatient  Remains inpatient appropriate because:Inpatient level of care appropriate due to severity of illness   Dispo: The patient is from: Home              Anticipated d/c is to: Home              Patient currently is not medically stable to d/c.   Difficult to place patient No   Objective: Blood pressure (!) 159/100, pulse (!) 108, temperature 98.7 F (37.1 C), temperature source Oral, resp. rate 18, height 5\' 8"  (1.727 m), weight 69.1 kg, SpO2 100 %.  Intake/Output Summary (Last 24 hours) at 06/07/2020 0921 Last data filed at 06/07/2020 0600 Gross per 24 hour  Intake 3545.74 ml  Output 2300 ml  Net 1245.74 ml   Filed Weights   06/06/20 0133  Weight: 69.1 kg    Examination: General: No acute respiratory distress Lungs: Clear to auscultation B - no wheezing  Cardiovascular: tachycardic - regular - no M or rub  Abdomen: Tender diffusely, bowel sounds hypoactive, no mass, no rebound Extremities: No edema B LE   CBC: Recent Labs  Lab 06/05/20 1703 06/06/20 0401 06/07/20 0422  WBC 8.7 11.6* 20.5*  HGB 11.6* 11.9* 11.0*  HCT 35.2* 36.4 33.7*  MCV 89.3 90.8  91.8  PLT 642* 596* 552*   Basic Metabolic Panel: Recent Labs  Lab 06/06/20 0401 06/06/20 1341 06/07/20 0422  NA 137 148* 138  K 4.5 4.1 3.9  CL 100 112* 106  CO2 20* 24 21*  GLUCOSE 446* 201* 247*  BUN 25* 28* 19  CREATININE 1.12* 1.08* 0.79  CALCIUM 9.7 10.3 9.7  MG  --   --  1.6*   GFR: Estimated Creatinine Clearance: 104.7 mL/min (by C-G formula based on SCr of 0.79 mg/dL).  Liver Function Tests: Recent Labs  Lab 06/05/20 1703 06/06/20 0031 06/06/20 0401  06/07/20 0422  AST 25 22 22 19   ALT 30 27 28 19   ALKPHOS 73 70 75 66  BILITOT 1.2 1.1 1.1 0.8  PROT 8.6* 8.0 8.4* 7.0  ALBUMIN 3.9 3.4* 3.9 3.1*   Recent Labs  Lab 06/05/20 1703  LIPASE 25    HbA1C: Hgb A1c MFr Bld  Date/Time Value Ref Range Status  05/09/2020 05:28 PM 11.5 (H) 4.8 - 5.6 % Final    Comment:    (NOTE) Pre diabetes:          5.7%-6.4%  Diabetes:              >6.4%  Glycemic control for   <7.0% adults with diabetes   02/24/2020 09:34 AM 10.8 (H) 4.8 - 5.6 % Final    Comment:    (NOTE) Pre diabetes:          5.7%-6.4%  Diabetes:              >6.4%  Glycemic control for   <7.0% adults with diabetes     CBG: Recent Labs  Lab 06/06/20 1740 06/06/20 2110 06/07/20 0004 06/07/20 0453 06/07/20 0759  GLUCAP 241* 243* 195* 244* 359*    Recent Results (from the past 240 hour(s))  Resp Panel by RT-PCR (Flu A&B, Covid) Nasopharyngeal Swab     Status: Abnormal   Collection Time: 06/05/20 10:53 PM   Specimen: Nasopharyngeal Swab; Nasopharyngeal(NP) swabs in vial transport medium  Result Value Ref Range Status   SARS Coronavirus 2 by RT PCR POSITIVE (A) NEGATIVE Final    Comment: RESULT CALLED TO, READ BACK BY AND VERIFIED WITH: TABITHA, RN @ 2350 ON 06/05/20 C VARNER (NOTE) SARS-CoV-2 target nucleic acids are DETECTED.  The SARS-CoV-2 RNA is generally detectable in upper respiratory specimens during the acute phase of infection. Positive results are indicative of the presence of the identified virus, but do not rule out bacterial infection or co-infection with other pathogens not detected by the test. Clinical correlation with patient history and other diagnostic information is necessary to determine patient infection status. The expected result is Negative.  Fact Sheet for Patients: 06/07/20  Fact Sheet for Healthcare Providers: 06/07/20  This test is not yet approved or  cleared by the [place] FDA and  has been authorized for detection and/or diagnosis of SARS-CoV-2 by FDA under an Emergency Use Authorization (EUA).  This EUA will remain in effect (meaning this test c an be used) for the duration of  the COVID-19 declaration under Section 564(b)(1) of the Act, 21 U.S.C. section 360bbb-3(b)(1), unless the authorization is terminated or revoked sooner.     Influenza A by PCR NEGATIVE NEGATIVE Final   Influenza B by PCR NEGATIVE NEGATIVE Final    Comment: (NOTE) The Xpert Xpress SARS-CoV-2/FLU/RSV plus assay is intended as an aid in the diagnosis of influenza from Nasopharyngeal swab specimens and should not be used as  a sole basis for treatment. Nasal washings and aspirates are unacceptable for Xpert Xpress SARS-CoV-2/FLU/RSV testing.  Fact Sheet for Patients: BloggerCourse.com  Fact Sheet for Healthcare Providers: SeriousBroker.it  This test is not yet approved or cleared by the [place] FDA and has been authorized for detection and/or diagnosis of SARS-CoV-2 by FDA under an Emergency Use Authorization (EUA). This EUA will remain in effect (meaning this test can be used) for the duration of the COVID-19 declaration under Section 564(b)(1) of the Act, 21 U.S.C. section 360bbb-3(b)(1), unless the authorization is terminated or revoked.  Performed at [facility], [address] [place], [place] [address]      Scheduled Meds: . atenolol  25 mg Oral BID  . insulin aspart  0-9 Units Subcutaneous Q4H  . insulin glargine  10 Units Subcutaneous Daily  . lisinopril  5 mg Oral Daily  . metoCLOPramide  5 mg Oral TID AC & HS  . pantoprazole (PROTONIX) IV  40 mg Intravenous Q12H   Continuous Infusions: . sodium chloride 150 mL/hr at 06/07/20 0139  . erythromycin 250 mg (06/07/20 0518)     LOS: 2 days   [name], MD [facility] Office   [phone] Pager - Text Page per Amion  If 7PM-7AM, please contact night-coverage per Amion 06/07/2020, 9:21 AM

## 2020-06-08 LAB — GLUCOSE, CAPILLARY
Glucose-Capillary: 222 mg/dL — ABNORMAL HIGH (ref 70–99)
Glucose-Capillary: 163 mg/dL — ABNORMAL HIGH (ref 70–99)
Glucose-Capillary: 38 mg/dL — CL (ref 70–99)
Glucose-Capillary: 262 mg/dL — ABNORMAL HIGH (ref 70–99)
Glucose-Capillary: 271 mg/dL — ABNORMAL HIGH (ref 70–99)

## 2020-06-08 LAB — COMPREHENSIVE METABOLIC PANEL
ALT: 20 U/L (ref 0–44)
AST: 21 U/L (ref 15–41)
Albumin: 3 g/dL — ABNORMAL LOW (ref 3.5–5.0)
Alkaline Phosphatase: 60 U/L (ref 38–126)
Anion gap: 9 (ref 5–15)
BUN: 22 mg/dL — ABNORMAL HIGH (ref 6–20)
CO2: 22 mmol/L (ref 22–32)
Calcium: 9.3 mg/dL (ref 8.9–10.3)
Chloride: 106 mmol/L (ref 98–111)
Creatinine, Ser: 0.9 mg/dL (ref 0.44–1.00)
GFR, Estimated: >60 mL/min (ref >60)
Glucose, Bld: 212 mg/dL — ABNORMAL HIGH (ref 70–99)
Potassium: 3.8 mmol/L (ref 3.5–5.1)
Sodium: 137 mmol/L (ref 135–145)
Total Bilirubin: 0.9 mg/dL (ref 0.3–1.2)
Total Protein: 6.4 g/dL — ABNORMAL LOW (ref 6.5–8.1)

## 2020-06-08 LAB — MAGNESIUM: Magnesium: 2.2 mg/dL (ref 1.7–2.4)

## 2020-06-08 MED ORDER — PANTOPRAZOLE SODIUM 40 MG PO TBEC
40.0000 mg | DELAYED_RELEASE_TABLET | Freq: Two times a day (BID) | ORAL | Status: DC
  Administered 2020-06-09: 40 mg via ORAL
  Filled 2020-06-08: qty 1

## 2020-06-08 MED ORDER — CLONIDINE HCL 0.1 MG PO TABS
0.1000 mg | ORAL_TABLET | Freq: Two times a day (BID) | ORAL | Status: DC
  Administered 2020-06-08 – 2020-06-09 (×2): 0.1 mg via ORAL
  Filled 2020-06-08 (×2): qty 1

## 2020-06-08 MED ORDER — ERYTHROMYCIN BASE 250 MG PO TABS
250.0000 mg | ORAL_TABLET | Freq: Three times a day (TID) | ORAL | Status: DC
  Administered 2020-06-09 (×2): 250 mg via ORAL
  Filled 2020-06-08 (×3): qty 1

## 2020-06-08 NOTE — Progress Notes (Signed)
Kelli Hudson  XQJ:[id number] DOB: 12/13/1990 DOA: 06/05/2020 PCP: Loyola Mast, MD    Brief Narrative:  30yo with a history of DM, HTN, gastroparesis, hypothyroidism, GERD, PUD, and depression who presented to the ED with the abrupt onset of abdominal pain, nausea, and vomiting 2 hours prior to arrival.  Date of 1st Positive COVID Test:  05/27/20  Vaccination Status: Vaccine x2 - no booster   COVID-19 specific Treatment: Remdesivir 4/10 > 4/11  Consultants:  None  Code Status: FULL CODE  Antimicrobials:  None  DVT prophylaxis: SCDs  Subjective: Afebrile.  Blood pressure now controlled.  Tachycardia resolved.  Reports feeling dramatically better today.  Feels that she is ready to attempt regular foods.  Denies shortness of breath chest pain or abdominal pain at present.  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 (DM1) - dosed w/ Remdesivir during prior admission - initial positive test was 4/10 during admission w/ transient fevers and sob but no hypoxia and CXR not entirely c/w CoViD type infiltrate - 10 day isolation period appropriate, with 4/20 being the 10th day of isolation - pt presently has no sx attributable to an active CoViD infxn - d/c isolation today   Acute kidney injury Creatinine has normalized with volume expansion  Recent Labs  Lab 06/06/20 0031 06/06/20 0401 06/06/20 1341  06/07/20 0422 06/08/20 0401  CREATININE 1.13* 1.12* 1.08* 0.79 0.90   Hypomagnesemia Corrected with supplementation  HTN Blood pressure now well controlled  GERD Resume usual home PPI dose - hold on additional H2 blocker for now   Family Communication:  Status is: Inpatient  Remains inpatient appropriate because:Inpatient level of care appropriate due to severity of illness   Dispo: The patient is from: Home              Anticipated d/c is to: Home              Patient currently is not medically stable to d/c.   Difficult to place patient No   Objective: Blood pressure (!) 126/94, pulse 89, temperature (!) 97.5 F (36.4 C), temperature source Oral, resp. rate 20, height 5\' 8"  (1.727 m), weight 69.1 kg, SpO2 100 %.  Intake/Output Summary (Last 24 hours) at 06/08/2020 1044 Last data filed at 06/08/2020 06/10/2020 Gross per 24 hour  Intake 1688.78 ml  Output 1100 ml  Net 588.78 ml   Filed Weights   06/06/20 0133  Weight: 69.1 kg    Examination: General: No acute respiratory distress Lungs: Clear to auscultation B without wheezing Cardiovascular: Regular rate and rhythm without murmur or rub Abdomen: Nontender, nondistended, soft, BS positive, no rebound Extremities: No edema B LE   CBC: Recent Labs  Lab 06/05/20 1703 06/06/20 0401 06/07/20 0422  WBC 8.7 11.6* 20.5*  HGB 11.6* 11.9* 11.0*  HCT 35.2* 36.4 33.7*  MCV 89.3 90.8 91.8  PLT 642* 596* 552*   Basic Metabolic Panel: Recent Labs  Lab 06/06/20 1341 06/07/20 0422 06/08/20 0401  NA 148* 138 137  K 4.1  3.9 3.8  CL 112* 106 106  CO2 24 21* 22  GLUCOSE 201* 247* 212*  BUN 28* 19 22*  CREATININE 1.08* 0.79 0.90  CALCIUM 10.3 9.7 9.3  MG  --  1.6* 2.2   GFR: Estimated Creatinine Clearance: 93 mL/min (by C-G formula based on SCr of 0.9 mg/dL).  Liver Function Tests: Recent Labs  Lab 06/06/20 0031 06/06/20 0401 06/07/20 0422 06/08/20 0401  AST 22 22 19 21   ALT 27 28 19 20   ALKPHOS 70 75 66 60   BILITOT 1.1 1.1 0.8 0.9  PROT 8.0 8.4* 7.0 6.4*  ALBUMIN 3.4* 3.9 3.1* 3.0*   Recent Labs  Lab 06/05/20 1703  LIPASE 25    HbA1C: Hgb A1c MFr Bld  Date/Time Value Ref Range Status  05/09/2020 05:28 PM 11.5 (H) 4.8 - 5.6 % Final    Comment:    (NOTE) Pre diabetes:          5.7%-6.4%  Diabetes:              >6.4%  Glycemic control for   <7.0% adults with diabetes   02/24/2020 09:34 AM 10.8 (H) 4.8 - 5.6 % Final    Comment:    (NOTE) Pre diabetes:          5.7%-6.4%  Diabetes:              >6.4%  Glycemic control for   <7.0% adults with diabetes     CBG: Recent Labs  Lab 06/07/20 1208 06/07/20 1656 06/07/20 1955 06/07/20 2353 06/08/20 0754  GLUCAP 234* 162* 94 188* 222*    Recent Results (from the past 240 hour(s))  Resp Panel by RT-PCR (Flu A&B, Covid) Nasopharyngeal Swab     Status: Abnormal   Collection Time: 06/05/20 10:53 PM   Specimen: Nasopharyngeal Swab; Nasopharyngeal(NP) swabs in vial transport medium  Result Value Ref Range Status   SARS Coronavirus 2 by RT PCR POSITIVE (A) NEGATIVE Final    Comment: RESULT CALLED TO, READ BACK BY AND VERIFIED WITH: TABITHA, RN @ 2350 ON 06/05/20 C VARNER (NOTE) SARS-CoV-2 target nucleic acids are DETECTED.  The SARS-CoV-2 RNA is generally detectable in upper respiratory specimens during the acute phase of infection. Positive results are indicative of the presence of the identified virus, but do not rule out bacterial infection or co-infection with other pathogens not detected by the test. Clinical correlation with patient history and other diagnostic information is necessary to determine patient infection status. The expected result is Negative.  Fact Sheet for Patients: 06/07/20  Fact Sheet for Healthcare Providers: 06/07/20  This test is not yet approved or cleared by the [employer] FDA and  has been authorized for detection and/or  diagnosis of SARS-CoV-2 by FDA under an Emergency Use Authorization (EUA).  This EUA will remain in effect (meaning this test c an be used) for the duration of  the COVID-19 declaration under Section 564(b)(1) of the Act, 21 U.S.C. section 360bbb-3(b)(1), unless the authorization is terminated or revoked sooner.     Influenza A by PCR NEGATIVE NEGATIVE Final   Influenza B by PCR NEGATIVE NEGATIVE Final    Comment: (NOTE) The Xpert Xpress SARS-CoV-2/FLU/RSV plus assay is intended as an aid in the diagnosis of influenza from Nasopharyngeal swab specimens and should not be used as a sole basis for treatment. Nasal washings and aspirates are unacceptable for Xpert Xpress SARS-CoV-2/FLU/RSV testing.  Fact Sheet for Patients: SeriousBroker.it  Fact Sheet for Healthcare Providers: [place]  This  test is not yet approved or cleared by the [place] and has been authorized for detection and/or diagnosis of SARS-CoV-2 by FDA under an Emergency Use Authorization (EUA). This EUA will remain in effect (meaning this test can be used) for the duration of the COVID-19 declaration under Section 564(b)(1) of the Act, 21 U.S.C. section 360bbb-3(b)(1), unless the authorization is terminated or revoked.  Performed at [facility], [address], [place], [place] [address]      Scheduled Meds: . atenolol  25 mg Oral BID  . cloNIDine  0.1 mg Oral TID  . insulin aspart  0-9 Units Subcutaneous TID WC  . insulin aspart  4 Units Subcutaneous TID WC  . insulin glargine  16 Units Subcutaneous Daily  . lisinopril  10 mg Oral Daily  . metoCLOPramide  5 mg Oral TID AC & HS  . pantoprazole (PROTONIX) IV  40 mg Intravenous Q12H   Continuous Infusions: . sodium chloride 75 mL/hr at 06/08/20 0214  . erythromycin 250 mg (06/08/20 0543)     LOS: 3 days   [name], MD [facility]   [phone] Pager - Text Page per Amion  If 7PM-7AM, please contact night-coverage per Amion 06/08/2020, 10:44 AM

## 2020-06-08 NOTE — Plan of Care (Signed)

## 2020-06-09 LAB — GLUCOSE, CAPILLARY
Glucose-Capillary: 167 mg/dL — ABNORMAL HIGH (ref 70–99)
Glucose-Capillary: 173 mg/dL — ABNORMAL HIGH (ref 70–99)
Glucose-Capillary: 191 mg/dL — ABNORMAL HIGH (ref 70–99)
Glucose-Capillary: 229 mg/dL — ABNORMAL HIGH (ref 70–99)

## 2020-06-09 MED ORDER — TRESIBA FLEXTOUCH 100 UNIT/ML ~~LOC~~ SOPN: 18.0000 [IU] | PEN_INJECTOR | Freq: Every day | SUBCUTANEOUS | 1 refills | Status: AC

## 2020-06-09 MED ORDER — ERYTHROMYCIN BASE 250 MG PO TABS: 250.0000 mg | ORAL_TABLET | Freq: Three times a day (TID) | ORAL | 1 refills | Status: DC

## 2020-06-09 MED ORDER — LISINOPRIL 10 MG PO TABS: 10.0000 mg | ORAL_TABLET | Freq: Every day | ORAL | 1 refills | Status: DC

## 2020-06-09 NOTE — Progress Notes (Signed)
   06/09/20   To Whom it may concern,  Kelli Hudson was admitted to [facility] on 06/05/2020 and remained under my care in the hospital through 06/09/2020.  She has been advised that she should not return to work until 06/12/2020, at which time she will be cleared to resume all of her usual responsibilities.  Sincerely,  [name], MD [facility]  [phone]

## 2020-06-09 NOTE — Discharge Instructions (Signed)
Gastroparesis  Gastroparesis is a condition in which food takes longer than normal to empty from the stomach. This condition is also known as delayed gastric emptying. It is usually a long-term (chronic) condition. There is no cure, but there are treatments and things that you can do at home to help relieve symptoms. Treating the underlying condition that causes gastroparesis can also help relieve symptoms. What are the causes? In many cases, the cause of this condition is not known. Possible causes include:  A hormone (endocrine) disorder, such as hypothyroidism or diabetes.  A nervous system disease, such as Parkinson's disease or multiple sclerosis.  Cancer, infection, or surgery that affects the stomach or vagus nerve. The vagus nerve runs from your chest, through your neck, and to the lower part of your brain.  A connective tissue disorder, such as scleroderma.  Certain medicines. What increases the risk? You are more likely to develop this condition if:  You have certain disorders or diseases. These may include: ? An endocrine disorder. ? An eating disorder. ? Amyloidosis. ? Scleroderma. ? Parkinson's disease. ? Multiple sclerosis. ? Cancer or infection of the stomach or the vagus nerve.  You have had surgery on your stomach or vagus nerve.  You take certain medicines.  You are female. What are the signs or symptoms? Symptoms of this condition include:  Feeling full after eating very little or a loss of appetite.  Nausea, vomiting, or heartburn.  Bloating of your abdomen.  Inconsistent blood sugar (glucose) levels on blood tests.  Unexplained weight loss.  Acid from the stomach coming up into the esophagus (gastroesophageal reflux).  Sudden tightening (spasm) of the stomach, which can be painful. Symptoms may come and go. Some people may not notice any symptoms. How is this diagnosed? This condition is diagnosed with tests, such as:  Tests that check how  long it takes food to move through the stomach and intestines. These tests include: ? Upper gastrointestinal (GI) series. For this test, you drink a liquid that shows up well on X-rays, and then X-rays are taken of your intestines. ? Gastric emptying scintigraphy. For this test, you eat food that contains a small amount of radioactive material, and then scans are taken. ? Wireless capsule GI monitoring system. For this test, you swallow a pill (capsule) that records information about how foods and fluid move through your stomach.  Gastric manometry. For this test, a tube is passed down your throat and into your stomach to measure electrical and muscular activity.  Endoscopy. For this test, a long, thin tube with a camera and light on the end is passed down your throat and into your stomach to check for problems in your stomach lining.  Ultrasound. This test uses sound waves to create images of the inside of your body. This can help rule out gallbladder disease or pancreatitis as a cause of your symptoms. How is this treated? There is no cure for this condition, but treatment and home care may relieve symptoms. Treatment may include:  Treating the underlying cause.  Managing your symptoms by making changes to your diet and exercise habits.  Taking medicines to control nausea and vomiting and to stimulate stomach muscles.  Getting food through a feeding tube in the hospital. This may be done in severe cases.  Having surgery to insert a device called a gastric electrical stimulator into your body. This device helps improve stomach emptying and control nausea and vomiting. Follow these instructions at home:  Take over-the-counter and   prescription medicines only as told by your health care provider.  Follow instructions from your health care provider about eating or drinking restrictions. Your health care provider may recommend that you: ? Eat smaller meals more often. ? Eat low-fat  foods. ? Eat low-fiber forms of high-fiber foods. For example, eat cooked vegetables instead of raw vegetables. ? Have only liquid foods instead of solid foods. Liquid foods are easier to digest.  Drink enough fluid to keep your urine pale yellow.  Exercise as often as told by your health care provider.  Keep all follow-up visits. This is important. Contact a health care provider if you:  Notice that your symptoms do not improve with treatment.  Have new symptoms. Get help right away if you:  Have severe pain in your abdomen that does not improve with treatment.  Have nausea that is severe or does not go away.  Vomit every time you drink fluids. Summary  Gastroparesis is a long-term (chronic) condition in which food takes longer than normal to empty from the stomach.  Symptoms include nausea, vomiting, heartburn, bloating of your abdomen, and loss of appetite.  Eating smaller portions, low-fat foods, and low-fiber forms of high-fiber foods may help you manage your symptoms.  Get help right away if you have severe pain in your abdomen. This information is not intended to replace advice given to you by your health care provider. Make sure you discuss any questions you have with your health care provider. Document Revised: 06/13/2019 Document Reviewed: 06/13/2019 Elsevier Patient Education  2021 Elsevier Inc.  

## 2020-06-09 NOTE — Progress Notes (Signed)
Pt discharged to home, instructions reviewed by RN [place],  Acknowledged understanding. SRP, RN

## 2020-06-09 NOTE — Discharge Summary (Signed)
DISCHARGE SUMMARY  Kelli Hudson  MR#: [id number]  DOB:07-04-1990  Date of Admission: 06/05/2020 Date of Discharge: 06/09/2020  Attending Physician:Ahmod Gillespie Hennie Duos, MD  Patient's CBJ:SEGB, Lillette Boxer, MD  Consults: none  Disposition: D/C home   Follow-up Appts:  Follow-up Information    [name], MD Follow up in 1 week(s).   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  Hospital Course:  Acute flare diabetic gastroparesis CTabdom w/o acute findings - diagnosis confirmed by gastric emptying scan 05/16/20 -counseled patient on need to avoid narcotics - improved greatly with the use of reglan and IV erythromycin, and with improved CBG control - advanced diet succesffully and transitioned to oral prokinetics on morning of d/c - counseled pt that strict CBG control key to controlling her gastroparesis   Uncontrolled DM1 with chronic diabetic gastroparesis A1c 11.5 - clearly not well controlled at home - adjustments of insulin during hospital stay has led to significant improvement in CBG control - d/c home on below listed modified insulin regimen   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 (DM1) - dosed w/ Remdesivir during prior admission - initial positive test was 4/10 during admission w/ transient fevers and sob but no hypoxia  and CXR not entirely c/w CoViD type infiltrate - 10 day isolation period appropriate, with 4/20 being the 10th day of isolation - pt had no sx attributable to an active CoViD infxn   Acute kidney injury Creatinine has normalized with volume expansion  Recent Labs  Lab 06/06/20 0031 06/06/20 0401 06/06/20 1341 06/07/20 0422 06/08/20 0401  CREATININE 1.13* 1.12* 1.08* 0.79 0.90    Hypomagnesemia Corrected with supplementation  HTN Blood pressure well controlled at time of d/c   GERD Resumed usual home PPI dose - hold on additional H2 blocker    Allergies as of 06/09/2020   No Known Allergies     Medication List    STOP taking these medications   famotidine 20 MG tablet Commonly known as: PEPCID   feeding supplement (ENSURE COMPLETE) Liqd   ipratropium 0.03 % nasal spray Commonly known as: ATROVENT     TAKE these medications   atenolol 25 MG tablet Commonly known as: TENORMIN Take 1 tablet (25 mg total) by mouth 2 (two) times daily.   erythromycin 250 MG tablet Commonly known as: E-MYCIN Take 1 tablet (250 mg total) by mouth with breakfast, with lunch, and with evening meal.   hyoscyamine 0.375 MG 12 hr tablet Commonly known as: LEVBID Take 1 tablet (0.375 mg total) by mouth every 12 (twelve) hours as needed for cramping (abdominal pain).   Insulin Pen Needle 31G X 5 MM Misc 1 Device by Does not apply route QID. For use with insulin pens   insulin regular 100 units/mL injection Commonly known as: NovoLIN R Inject 0-0.15 mLs (0-15 Units total) into the skin 3 (three) times daily before meals. CBG 70 - 120: 0 units  CBG 121 - 150: 2 units CBG 151 - 200: 3 units CBG 201 - 250: 5 units CBG 251 - 300: 8 units CBG 301 - 350: 11 units CBG 351 - 400: 15 units   levalbuterol 45 MCG/ACT inhaler Commonly known as: XOPENEX HFA Inhale 2 puffs into the lungs every 6 (six) hours as needed for wheezing or shortness of breath.   lisinopril 10 MG tablet Commonly  known as: ZESTRIL Take 1 tablet (10 mg total) by mouth daily. Start taking on: June 10, 2020 What changed:   medication strength  how much to take   metoCLOPramide 5 MG tablet Commonly known as: Reglan Take 1 tablet (5 mg total) by mouth 3 (three) times daily before meals.   multivitamin with minerals Tabs tablet Take 1 tablet by mouth daily.   ondansetron 4 MG disintegrating tablet Commonly known as: Zofran ODT Take 1 tablet (4 mg total) by mouth every 6 (six) hours as needed for nausea or vomiting.   pantoprazole 40 MG tablet Commonly known as: PROTONIX Take 1 tablet (40 mg total) by mouth 2 (two) times daily.   promethazine 12.5 MG tablet Commonly known as: PHENERGAN Take 1 tablet (12.5 mg total) by mouth every 6 (six) hours as needed for nausea or vomiting.   Tyler Aas FlexTouch 100 UNIT/ML FlexTouch Pen Generic drug: insulin degludec Inject 18 Units into the skin at bedtime. What changed: how much to take   True Metrix Blood Glucose Test test strip Generic drug: glucose blood Use as instructed   True Metrix Meter w/Device Kit 1 Device by Does not apply route 4 (four) times daily.   TRUEplus Lancets 28G Misc assist with checking blood sugar TID and qhs       Day of Discharge BP 104/64 (BP Location: Left Arm)   Pulse 85   Temp 98.2 F (36.8 C) (Oral)   Resp 20   Ht _0  (1.727 m)   Wt 69.1 kg   SpO2 100%   BMI 23.16 kg/m   Physical Exam: General: No acute respiratory distress Lungs: Clear to auscultation bilaterally without wheezes or crackles Cardiovascular: Regular rate and rhythm without murmur gallop or rub normal S1 and S2 Abdomen: Nontender, nondistended, soft, bowel sounds positive, no rebound, no ascites, no appreciable mass Extremities: No significant cyanosis, clubbing, or edema bilateral lower extremities  Basic Metabolic Panel: Recent Labs  Lab 06/06/20 0031 06/06/20 0401 06/06/20 1341 06/07/20 0422 06/08/20 0401  NA 137 137 148*  138 137  K 4.3 4.5 4.1 3.9 3.8  CL 100 100 112* 106 106  CO2 21* 20* 24 21* 22  GLUCOSE 460* 446* 201* 247* 212*  BUN 25* 25* 28* 19 22*  CREATININE 1.13* 1.12* 1.08* 0.79 0.90  CALCIUM 9.5 9.7 10.3 9.7 9.3  MG  --   --   --  1.6* 2.2    Liver Function Tests: Recent Labs  Lab 06/05/20 1703 06/06/20 0031 06/06/20 0401 06/07/20 0422 06/08/20 0401  AST _1 ALT _2 ALKPHOS 73 70 75 66 60  BILITOT 1.2 1.1 1.1 0.8 0.9  PROT 8.6* 8.0 8.4* 7.0 6.4*  ALBUMIN 3.9 3.4* 3.9 3.1* 3.0*   Recent Labs  Lab 06/05/20 1703  LIPASE 25   CBC: Recent Labs  Lab 06/05/20 1703 06/06/20 0401 06/07/20 0422  WBC 8.7 11.6* 20.5*  HGB 11.6* 11.9* 11.0*  HCT 35.2* 36.4 33.7*  MCV 89.3 90.8 91.8  PLT 642* 596* 552*  CBG: Recent Labs  Lab 06/08/20 1827 06/08/20 2001 06/09/20 0008 06/09/20 0445 06/09/20 0721  GLUCAP 262* 271* 229* 173* 191*    Recent Results (from the past 240 hour(s))  Resp Panel by RT-PCR (Flu A&B, Covid) Nasopharyngeal Swab     Status: Abnormal   Collection Time: 06/05/20 10:53 PM   Specimen: Nasopharyngeal Swab; Nasopharyngeal(NP) swabs in vial transport medium  Result Value Ref Range Status   SARS Coronavirus 2 by RT PCR POSITIVE (A) NEGATIVE Final    Comment: RESULT CALLED TO, READ BACK BY AND VERIFIED WITH: TABITHA, RN @ 4235 ON 06/05/20 C VARNER (NOTE) SARS-CoV-2 target nucleic acids are DETECTED.  The SARS-CoV-2 RNA is generally detectable in upper respiratory specimens during the acute phase of infection. Positive results are indicative of the presence of the identified virus, but do not rule out bacterial infection or co-infection with other pathogens not detected by the test. Clinical correlation with patient history and other diagnostic information is necessary to determine patient infection status. The expected result is Negative.  Fact Sheet for Patients: EntrepreneurPulse.com.au  Fact Sheet for  Healthcare Providers: IncredibleEmployment.be  This test is not yet approved or cleared by the [place] FDA and  has been authorized for detection and/or diagnosis of SARS-CoV-2 by FDA under an Emergency Use Authorization (EUA).  This EUA will remain in effect (meaning this test c an be used) for the duration of  the COVID-19 declaration under Section 564(b)(1) of the Act, 21 U.S.C. section 360bbb-3(b)(1), unless the authorization is terminated or revoked sooner.     Influenza A by PCR NEGATIVE NEGATIVE Final   Influenza B by PCR NEGATIVE NEGATIVE Final    Comment: (NOTE) The Xpert Xpress SARS-CoV-2/FLU/RSV plus assay is intended as an aid in the diagnosis of influenza from Nasopharyngeal swab specimens and should not be used as a sole basis for treatment. Nasal washings and aspirates are unacceptable for Xpert Xpress SARS-CoV-2/FLU/RSV testing.  Fact Sheet for Patients: EntrepreneurPulse.com.au  Fact Sheet for Healthcare Providers: IncredibleEmployment.be  This test is not yet approved or cleared by the [place] FDA and has been authorized for detection and/or diagnosis of SARS-CoV-2 by FDA under an Emergency Use Authorization (EUA). This EUA will remain in effect (meaning this test can be used) for the duration of the COVID-19 declaration under Section 564(b)(1) of the Act, 21 U.S.C. section 360bbb-3(b)(1), unless the authorization is terminated or revoked.  Performed at [facility], [place] [address], [place], [place] [address]       Time spent in discharge (includes decision making & examination of pt): 35 minutes  06/09/2020, 11:23 AM   Cherene Altes, MD [facility]  [phone]

## 2020-06-09 NOTE — Progress Notes (Signed)
Pt tolerated breakfast( 100% of meal) without nausea, pt states feel much better.

## 2020-06-09 NOTE — Plan of Care (Signed)

## 2020-06-12 ENCOUNTER — Telehealth: Payer: Self-pay

## 2020-06-12 NOTE — Telephone Encounter (Signed)
Transition Care Management Follow-up Telephone Call  Date of discharge and from where: 06/09/20-[facility]  How have you been since you were released from the hospital? Good  Any questions or concerns? Yes  Items Reviewed:  Did the pt receive and understand the discharge instructions provided? Yes   Medications obtained and verified? Yes   Other? Yes   Any new allergies since your discharge? No   Dietary orders reviewed? Yes  Do you have support at home? Yes   Home Care and Equipment/Supplies: Were home health services ordered? no If so, what is the name of the agency? n/a  Has the agency set up a time to come to the patient's home? not applicable Were any new equipment or medical supplies ordered?  No What is the name of the medical supply agency? n/a Were you able to get the supplies/equipment? not applicable Do you have any questions related to the use of the equipment or supplies? n/a  Functional Questionnaire: (I = Independent and D = Dependent) ADLs:  I  Bathing/Dressing- I  Meal Prep- I  Eating- I  Maintaining continence- I  Transferring/Ambulation- I  Managing Meds- I  Follow up appointments reviewed:   PCP Hospital f/u appt confirmed? Yes  Scheduled to see Dr. Veto Kemps on 06/14/20 @ 11:30.  Specialist Hospital f/u appt confirmed? No  N/A  Are transportation arrangements needed? No   If their condition worsens, is the pt aware to call PCP or go to the Emergency Dept.? Yes  Was the patient provided with contact information for the PCP's office or ED? Yes  Was to pt encouraged to call back with questions or concerns? Yes

## 2020-06-13 ENCOUNTER — Other Ambulatory Visit: Payer: Self-pay

## 2020-06-13 ENCOUNTER — Ambulatory Visit: Payer: Self-pay

## 2020-06-13 ENCOUNTER — Telehealth: Payer: 59

## 2020-06-13 DIAGNOSIS — K3184 Gastroparesis: Secondary | ICD-10-CM

## 2020-06-13 DIAGNOSIS — E1165 Type 2 diabetes mellitus with hyperglycemia: Secondary | ICD-10-CM

## 2020-06-13 NOTE — Chronic Care Management (AMB) (Signed)
Care Management    RN Visit Note  06/15/2020 Name: Kelli Hudson MRN: [id number] DOB: [birth year]  Subjective: Kelli Hudson is a 30 y.o. year old female who is a primary care patient of Rudd, Lillette Boxer, MD. The care management team was consulted for assistance with disease management and care coordination needs.    Engaged with patient by telephone for initial visit in response to provider referral for case management and/or care coordination services.   Consent to Services:   [name] was given information about Care Management services today including:  1. Care Management services includes personalized support from designated clinical staff supervised by her physician, including individualized plan of care and coordination with other care providers 2. 24/7 contact phone numbers for assistance for urgent and routine care needs. 3. The patient may stop case management services at any time by phone call to the office staff.  Patient agreed to services and consent obtained.   Assessment: Review of patient past medical history, allergies, medications, health status, including review of consultants reports, laboratory and other test data, was performed as part of comprehensive evaluation and provision of chronic care management services.   SDOH (Social Determinants of Health) assessments and interventions performed:  SDOH Interventions   Flowsheet Row Most Recent Value  SDOH Interventions   Food Insecurity Interventions Intervention Not Indicated  Financial Strain Interventions Intervention Not Indicated  Housing Interventions Intervention Not Indicated  Transportation Interventions Intervention Not Indicated       Care Plan  No Known Allergies  Outpatient Encounter Medications as of 06/13/2020  Medication Sig Note  . atenolol (TENORMIN) 25 MG tablet Take 1 tablet (25 mg total) by mouth 2 (two) times daily.   . hyoscyamine (LEVBID) 0.375 MG 12 hr tablet  Take 1 tablet (0.375 mg total) by mouth every 12 (twelve) hours as needed for cramping (abdominal pain).   . insulin degludec (TRESIBA FLEXTOUCH) 100 UNIT/ML FlexTouch Pen Inject 18 Units into the skin at bedtime.   . insulin regular (NOVOLIN R) 100 units/mL injection Inject 0-0.15 mLs (0-15 Units total) into the skin 3 (three) times daily before meals. CBG 70 - 120: 0 units CBG 121 - 150: 2 units CBG 151 - 200: 3 units CBG 201 - 250: 5 units CBG 251 - 300: 8 units CBG 301 - 350: 11 units CBG 351 - 400: 15 units   . levalbuterol (XOPENEX HFA) 45 MCG/ACT inhaler Inhale 2 puffs into the lungs every 6 (six) hours as needed for wheezing or shortness of breath.   . lisinopril (ZESTRIL) 10 MG tablet Take 1 tablet (10 mg total) by mouth daily.   . metoCLOPramide (REGLAN) 5 MG tablet Take 1 tablet (5 mg total) by mouth 3 (three) times daily before meals.   . Multiple Vitamin (MULTIVITAMIN WITH MINERALS) TABS tablet Take 1 tablet by mouth daily.   . ondansetron (ZOFRAN ODT) 4 MG disintegrating tablet Take 1 tablet (4 mg total) by mouth every 6 (six) hours as needed for nausea or vomiting.   . pantoprazole (PROTONIX) 40 MG tablet Take 1 tablet (40 mg total) by mouth 2 (two) times daily. 06/13/2020: Patient states she takes 1 tablet 1 time per day  . promethazine (PHENERGAN) 12.5 MG tablet Take 1 tablet (12.5 mg total) by mouth every 6 (six) hours as needed for nausea or vomiting.   . [DISCONTINUED] erythromycin (E-MYCIN) 250 MG tablet Take 1 tablet (250 mg total) by mouth with breakfast, with lunch, and with evening meal. (Patient not  taking: Reported on 06/14/2020)   . Blood Glucose Monitoring Suppl (TRUE METRIX METER) w/Device KIT 1 Device by Does not apply route 4 (four) times daily.   Kelli Hudson glucose blood (TRUE METRIX BLOOD GLUCOSE TEST) test strip Use as instructed   . Insulin Pen Needle 31G X 5 MM MISC 1 Device by Does not apply route QID. For use with insulin pens   . TRUEPLUS LANCETS 28G MISC assist with  checking blood sugar TID and qhs   . [DISCONTINUED] insulin aspart (NOVOLOG) 100 UNIT/ML FlexPen Inject 5 Units into the skin 3 (three) times daily with meals.    No facility-administered encounter medications on file as of 06/13/2020.    Patient Active Problem List   Diagnosis Date Noted  . Sepsis ([place]) 05/27/2020  . Borderline hyperlipidemia 05/23/2020  . Gastroparesis due to DM ([place]) 05/11/2020  . 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 Tachycardia 05/09/2020  . Normocytic anemia 04/20/2020  . Hyperosmolar hyperglycemic state (HHS) ([place]) 02/24/2020  . Intractable nausea and vomiting   . Intractable abdominal pain 01/24/2020  . H. pylori infection 10/07/2019  . Depression with anxiety 07/05/2019  . Diabetic foot ulcer ([name]) 02/21/2018  . Diabetic polyneuropathy associated with type 2 diabetes mellitus ([name]) 09/24/2017  . Pain in right foot 09/24/2017  . Gastroparesis   . Leukocytosis   . Increased frequency of urination 06/14/2011  . UTI (urinary tract infection) 06/14/2011  . Goiter 10/05/2009  . Uncontrolled diabetes mellitus ([name]) 10/05/2009  . Essential hypertension 10/05/2009    Conditions to be addressed/monitored: DMII and Gastroparesis  Care Plan : Diabetes Type 2 (Adult)  Updates made by Dannielle Karvonen, RN since 06/15/2020 12:00 AM  Problem: Knowledge deficit related to long term self health management of diabetes with gastroparesis.   Priority: High  Long-Range Goal: Disease progression minimized   Start Date: 06/13/2020  Expected End Date: 09/16/2020  This Visit's Progress: On track  Priority: High  Note:   Objective:  Lab Results  Component Value Date   HGBA1C 11.5 (H) 05/09/2020 .   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 Patient has has 5 hospitalizations since January 2022.  Patient states current HgbA1c is 11.5. She states she checks her blood sugars 2-3 times per day and reports blood sugars ranging from 100 to 300's.  Patient reports her primary care provider referred her to an endocrinologist and nutritionist. She states she has not return call to endocrinologist office to schedule appointment and has not heard from a nutritionist.  Case Manager Clinical Goal(s):  . patient will demonstrate improved adherence to prescribed treatment plan for diabetes self care/management as evidenced by: daily monitoring and recording of CBG  adherence to ADA/ carb modified diet adherence to prescribed medication regimen contacting provider for new or worsened symptoms or questions . Patient will schedule and attend appointment with endocrinologist . Patient will report scheduling appointment with nutritionist Interventions:  . Collaboration with [name], MD regarding development and update of comprehensive plan of care as evidenced by provider attestation and co-signature . Inter-disciplinary care team collaboration (see longitudinal plan of care): . Reviewed medications with patient and discussed importance  of medication adherence . Discussed plans with patient for ongoing care management follow up and provided patient with direct contact information for care management team . Provided patient with written educational materials related to hypo and hyperglycemia and importance of correct treatment . Reviewed scheduled/upcoming provider appointments including: Follow up appointment with primary care provider is 06/27/2020 and with gastroenterologist is 06/26/2020. Kelli Hudson Provided patient with written education material related to gastroparesis. Kelli Hudson Referral made to pharmacy team for medication assistance with  Novolog Self-Care Activities - Self administers insulin as prescribed Attends all scheduled provider appointments Checks blood sugars as prescribed and utilize hyper and hypoglycemia protocol as needed Adheres to prescribed ADA/carb modified Patient Goals: - check blood sugar at prescribed times - check blood sugar if I feel it is too high or too low - take the blood sugar log and/ or meter to all doctor visits - drink 6 to 8 glasses of water each day - eat small, frequent meals - Avoid raw and uncooked fruits and vegetables, fibrous fruits and vegetables - eat foods low in fat - gentle exercise following meals, such as walking - check feet daily for cuts, sores or redness and wash and dry feet carefully every day - Return call to endocrinology office to schedule initial visit.  - Follow up with primary care provider office regarding nutritionist referral. - Review education article on gastroparesis.  - Review education information on hypoglycemia / hyperglycemia Follow Up Plan: The patient has been provided with contact information for the care management team and has been advised to call with any health related questions or concerns.  The care management team will reach out to the patient again over the next 30 days.        Plan: The patient has been provided with contact information for the care management team and has been advised to call with any health related questions or concerns.  and The care management team will reach out to the patient again over the next 30 days.  Quinn Plowman RN,BSN,CCM RN Case Manager [place] [phone]

## 2020-06-14 ENCOUNTER — Ambulatory Visit (INDEPENDENT_AMBULATORY_CARE_PROVIDER_SITE_OTHER): Payer: 59 | Admitting: Family Medicine

## 2020-06-14 ENCOUNTER — Encounter: Payer: Self-pay | Admitting: Family Medicine

## 2020-06-14 VITALS — BP 166/90 | HR 125 | Temp 97.7°F | Ht 68.0 in | Wt 165.8 lb

## 2020-06-14 DIAGNOSIS — K3184 Gastroparesis: Secondary | ICD-10-CM

## 2020-06-14 DIAGNOSIS — I10 Essential (primary) hypertension: Secondary | ICD-10-CM

## 2020-06-14 DIAGNOSIS — E1165 Type 2 diabetes mellitus with hyperglycemia: Secondary | ICD-10-CM | POA: Diagnosis not present

## 2020-06-14 MED ORDER — HYDROCHLOROTHIAZIDE 25 MG PO TABS: 25.0000 mg | ORAL_TABLET | Freq: Every day | ORAL | 3 refills | Status: DC

## 2020-06-14 NOTE — Progress Notes (Signed)
[facility] [address] [place] [address] Dept: [phone] Dept Fax: [phone]  Office Visit  Subjective:    Patient ID: Kelli Hudson, female    DOB: 1990-08-31, 30 y.o..   MRN: [id number]  Chief Complaint  Patient presents with  . Hospitalization Follow-up    Hospital f/u form 06/05/20 for dehydration/N&V.  States she is feeling good today.      History of Present Illness:  Patient is in today for hospital follow-up. She had a recent admission (4/19-4/23) related to an acute flare of her gastroparesis. She notes that she is feeling improved at present. She notes she is taking her medications without difficulty. Her blood sugar this morning was 147. She did not take any regular insulin for this. She is back to work at the [employer]. She notes that she has discussed with her supervisor about a FMLA paperwork due to missed time due to her diabetes and a reasonable accomodation to limit heavier lifting, which exacerbates her gastroparesis.   Past Medical History: Patient Active Problem List   Diagnosis Date Noted  . Sepsis ([place]) 05/27/2020  . Borderline hyperlipidemia 05/23/2020  . Gastroparesis due to DM ([place]) 05/11/2020  . 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 Tachycardia 05/09/2020  . Normocytic anemia 04/20/2020  . Hyperosmolar hyperglycemic state (HHS) ([place]) 02/24/2020  . Intractable nausea and vomiting   . Intractable abdominal pain 01/24/2020  . H. pylori infection 10/07/2019  . Depression with anxiety 07/05/2019  . Diabetic foot ulcer ([place]) 02/21/2018  . Diabetic polyneuropathy associated with type 2 diabetes mellitus ([place]) 09/24/2017  . Pain in right foot 09/24/2017  . Gastroparesis   . Leukocytosis   . Increased frequency of urination 06/14/2011  . UTI (urinary tract infection) 06/14/2011  . Goiter 10/05/2009  . Uncontrolled diabetes mellitus ([place])  10/05/2009  . Essential hypertension 10/05/2009   Past Surgical History:  Procedure Laterality Date  . AMPUTATION TOE Left 03/10/2018   Procedure: AMPUTATION FIFTH TOE;  Surgeon: Trula Slade, DPM;  Location: [facility];  Service: Podiatry;  Laterality: Left;  . BIOPSY  01/28/2020   Procedure: BIOPSY;  Surgeon: Otis Brace, MD;  Location: [facility] END[facility]COPY;  Service: Gastroenterology;;  . ESOPHAGOGASTRODUODENOSCOPY N/A 01/28/2020   Procedure: ESOPHAGOGASTRODUODENOSCOPY (EGD);  Surgeon: Otis Brace, MD;  Location: [facility];  Service: Gastroenterology;  Laterality: N/A;  . ESOPHAGOGASTRODUODENOSCOPY (EGD) WITH PROPOFOL Left 09/08/2015   Procedure: ESOPHAGOGASTRODUODENOSCOPY (EGD) WITH PROPOFOL;  Surgeon: Arta Silence, MD;  Location: [facility] ENDOSCOPY;  Service: Endoscopy;  Laterality: Left;  . WISDOM TOOTH EXTRACTION     Family History  Problem Relation Age of Onset  . Lung cancer Mother   . Diabetes Mother   . Bipolar disorder Father   . Liver cancer Maternal Grandfather   . Breast cancer Other        maternal great aunt  . Heart disease Other   . Ovarian cancer Other        maternal great aunt  . Pancreatic cancer Maternal Uncle   . Prostate cancer Maternal Uncle   . Diabetes Maternal Aunt        x 2  . Kidney disease Other        maternal great aunt   Outpatient Medications Prior to Visit  Medication Sig Dispense Refill  . atenolol (TENORMIN) 25 MG tablet Take 1 tablet (25 mg total) by mouth 2 (two) times daily. 60 tablet 2  . Blood Glucose  Monitoring Suppl (TRUE METRIX METER) w/Device KIT 1 Device by Does not apply route 4 (four) times daily. 1 kit 0  . glucose blood (TRUE METRIX BLOOD GLUCOSE TEST) test strip Use as instructed 100 each 12  . hyoscyamine (LEVBID) 0.375 MG 12 hr tablet Take 1 tablet (0.375 mg total) by mouth every 12 (twelve) hours as needed for cramping (abdominal pain). 60 tablet 0  . insulin degludec (TRESIBA FLEXTOUCH) 100  UNIT/ML FlexTouch Pen Inject 18 Units into the skin at bedtime. 5.5 mL 1  . Insulin Pen Needle 31G X 5 MM MISC 1 Device by Does not apply route QID. For use with insulin pens 100 each 0  . insulin regular (NOVOLIN R) 100 units/mL injection Inject 0-0.15 mLs (0-15 Units total) into the skin 3 (three) times daily before meals. CBG 70 - 120: 0 units CBG 121 - 150: 2 units CBG 151 - 200: 3 units CBG 201 - 250: 5 units CBG 251 - 300: 8 units CBG 301 - 350: 11 units CBG 351 - 400: 15 units 10 mL 0  . levalbuterol (XOPENEX HFA) 45 MCG/ACT inhaler Inhale 2 puffs into the lungs every 6 (six) hours as needed for wheezing or shortness of breath. 1 each 0  . lisinopril (ZESTRIL) 10 MG tablet Take 1 tablet (10 mg total) by mouth daily. 30 tablet 1  . metoCLOPramide (REGLAN) 5 MG tablet Take 1 tablet (5 mg total) by mouth 3 (three) times daily before meals. 90 tablet 5  . Multiple Vitamin (MULTIVITAMIN WITH MINERALS) TABS tablet Take 1 tablet by mouth daily.    . ondansetron (ZOFRAN ODT) 4 MG disintegrating tablet Take 1 tablet (4 mg total) by mouth every 6 (six) hours as needed for nausea or vomiting. 40 tablet 6  . pantoprazole (PROTONIX) 40 MG tablet Take 1 tablet (40 mg total) by mouth 2 (two) times daily. 60 tablet 0  . promethazine (PHENERGAN) 12.5 MG tablet Take 1 tablet (12.5 mg total) by mouth every 6 (six) hours as needed for nausea or vomiting. 30 tablet 0  . TRUEPLUS LANCETS 28G MISC assist with checking blood sugar TID and qhs 100 each 3  . erythromycin (E-MYCIN) 250 MG tablet Take 1 tablet (250 mg total) by mouth with breakfast, with lunch, and with evening meal. (Patient not taking: Reported on 06/14/2020) 90 tablet 1   No facility-administered medications prior to visit.   No Known Allergies    Objective:   Today's Vitals   06/14/20 1142 06/14/20 1145  BP: (!) 176/90 (!) 166/90  Pulse: (!) 125   Temp: 97.7 F (36.5 C)   TempSrc: Temporal   SpO2: 99%   Weight: 165 lb 12.8 oz (75.2  kg)   Height: _0  (1.727 m)    Body mass index is 25.21 kg/m.   General: Well developed, well nourished. No acute distress. Psych: Alert and oriented x3. Normal mood and affect.  Health Maintenance Due  Topic Date Due  . Hepatitis C Screening  Never done  . OPHTHALMOLOGY EXAM  Never done  . PAP SMEAR-Modifier  Never done     Assessment & Plan:   1. Gastroparesis Improved currently. We will continue use of Reglan for management.  2. Essential hypertension Blood pressure is elevated today. I recommend we add HCTZ to her atenolol and lisinopril. She is scheduled for follow-up on 06/27/2020. We will reassess then.  - hydrochlorothiazide (HYDRODIURIL) 25 MG tablet; Take 1 tablet (25 mg total) by mouth daily.  Dispense: 90  tablet; Refill: 3  3. Uncontrolled type 2 diabetes mellitus with hyperglycemia (HCC) Stable. Plan HbA1c and other DM labs at her next visit.  [name], MD

## 2020-06-15 NOTE — Patient Instructions (Addendum)
Visit Information:  Thank you for taking the time to speak with me.   PATIENT GOALS:  Goals Addressed            This Visit's Progress   . Monitor and manage blood sugars and gastroparesis symptoms       Timeframe:  Long-Range Goal Priority:  High Start Date:    06/13/2020                        Expected End Date:  09/16/2020                     Follow Up Date 07/13/2020    - check blood sugar at prescribed times - check blood sugar if I feel it is too high or too low - take the blood sugar log and/ or meter to all doctor visits - drink 6 to 8 glasses of water each day - eat small, frequent meals - Avoid raw and uncooked fruits and vegetables, fibrous fruits and vegetables - eat foods low in fat - gentle exercise following meals, such as walking - check feet daily for cuts, sores or redness and wash and dry feet carefully every day - Return call to endocrinology office to schedule initial visit.  - Follow up with primary care provider office regarding nutritionist referral. - Review education article on gastroparesis.     Why is this important?    Checking your blood sugar at home helps to keep it from getting very high or very low.   Writing the results in a diary or log helps the doctor know how to care for you.   Your blood sugar log should have the time, date and the results.   Also, write down the amount of insulin or other medicine that you take.   Other information, like what you ate, exercise done and how you were feeling, will also be helpful.            Gastroparesis  Gastroparesis is a condition in which food takes longer than normal to empty from the stomach. This condition is also known as delayed gastric emptying. It is usually a long-term (chronic) condition. There is no cure, but there are treatments and things that you can do at home to help relieve symptoms. Treating the underlying condition that causes gastroparesis can also help relieve  symptoms. What are the causes? In many cases, the cause of this condition is not known. Possible causes include:  A hormone (endocrine) disorder, such as hypothyroidism or diabetes.  A nervous system disease, such as Parkinson's disease or multiple sclerosis.  Cancer, infection, or surgery that affects the stomach or vagus nerve. The vagus nerve runs from your chest, through your neck, and to the lower part of your brain.  A connective tissue disorder, such as scleroderma.  Certain medicines. What increases the risk? You are more likely to develop this condition if:  You have certain disorders or diseases. These may include: ? An endocrine disorder. ? An eating disorder. ? Amyloidosis. ? Scleroderma. ? Parkinson's disease. ? Multiple sclerosis. ? Cancer or infection of the stomach or the vagus nerve.  You have had surgery on your stomach or vagus nerve.  You take certain medicines.  You are female. What are the signs or symptoms? Symptoms of this condition include:  Feeling full after eating very little or a loss of appetite.  Nausea, vomiting, or heartburn.  Bloating of your abdomen.  Inconsistent  blood sugar (glucose) levels on blood tests.  Unexplained weight loss.  Acid from the stomach coming up into the esophagus (gastroesophageal reflux).  Sudden tightening (spasm) of the stomach, which can be painful. Symptoms may come and go. Some people may not notice any symptoms. How is this diagnosed? This condition is diagnosed with tests, such as:  Tests that check how long it takes food to move through the stomach and intestines. These tests include: ? Upper gastrointestinal (GI) series. For this test, you drink a liquid that shows up well on X-rays, and then X-rays are taken of your intestines. ? Gastric emptying scintigraphy. For this test, you eat food that contains a small amount of radioactive material, and then scans are taken. ? Wireless capsule GI  monitoring system. For this test, you swallow a pill (capsule) that records information about how foods and fluid move through your stomach.  Gastric manometry. For this test, a tube is passed down your throat and into your stomach to measure electrical and muscular activity.  Endoscopy. For this test, a long, thin tube with a camera and light on the end is passed down your throat and into your stomach to check for problems in your stomach lining.  Ultrasound. This test uses sound waves to create images of the inside of your body. This can help rule out gallbladder disease or pancreatitis as a cause of your symptoms. How is this treated? There is no cure for this condition, but treatment and home care may relieve symptoms. Treatment may include:  Treating the underlying cause.  Managing your symptoms by making changes to your diet and exercise habits.  Taking medicines to control nausea and vomiting and to stimulate stomach muscles.  Getting food through a feeding tube in the hospital. This may be done in severe cases.  Having surgery to insert a device called a gastric electrical stimulator into your body. This device helps improve stomach emptying and control nausea and vomiting. Follow these instructions at home:  Take over-the-counter and prescription medicines only as told by your health care provider.  Follow instructions from your health care provider about eating or drinking restrictions. Your health care provider may recommend that you: ? Eat smaller meals more often. ? Eat low-fat foods. ? Eat low-fiber forms of high-fiber foods. For example, eat cooked vegetables instead of raw vegetables. ? Have only liquid foods instead of solid foods. Liquid foods are easier to digest.  Drink enough fluid to keep your urine pale yellow.  Exercise as often as told by your health care provider.  Keep all follow-up visits. This is important. Contact a health care provider if  you:  Notice that your symptoms do not improve with treatment.  Have new symptoms. Get help right away if you:  Have severe pain in your abdomen that does not improve with treatment.  Have nausea that is severe or does not go away.  Vomit every time you drink fluids. Summary  Gastroparesis is a long-term (chronic) condition in which food takes longer than normal to empty from the stomach.  Symptoms include nausea, vomiting, heartburn, bloating of your abdomen, and loss of appetite.  Eating smaller portions, low-fat foods, and low-fiber forms of high-fiber foods may help you manage your symptoms.  Get help right away if you have severe pain in your abdomen. This information is not intended to replace advice given to you by your health care provider. Make sure you discuss any questions you have with your health care provider.  Document Revised: 06/13/2019 Document Reviewed: 06/13/2019 Elsevier Patient Education  2021 [place].  Hypoglycemia Hypoglycemia is when the sugar (glucose) level in your blood is too low. Low blood sugar can happen to people who have diabetes and people who do not have diabetes. Low blood sugar can happen quickly, and it can be an emergency. What are the causes? This condition happens most often in people who have diabetes and may be caused by:  Diabetes medicine.  Not eating enough, or not eating often enough.  Doing more physical activity.  Drinking alcohol on an empty stomach. If you do not have diabetes, hypoglycemia may be caused by:  A tumor in the pancreas.  Not eating enough, or not eating for long periods at a time (fasting).  A very bad infection or illness.  Problems after having weight loss (bariatric) surgery.  Kidney failure or liver failure.  Certain medicines. What increases the risk? This condition is more likely to develop in people who:  Have diabetes and take medicines to lower their blood sugar.  Abuse  alcohol.  Have a very bad illness. What are the signs or symptoms? Symptoms depend on whether your low blood sugar is mild, moderate, or very low. Mild  Hunger.  Feeling worried or nervous (anxious).  Sweating and feeling clammy.  Feeling dizzy or light-headed.  Being sleepy or having trouble sleeping.  Feeling like you may vomit (nauseous).  A fast heartbeat.  A headache.  Blurry vision.  Being irritable or grouchy.  Tingling or loss of feeling (numbness) around your mouth, lips, or tongue.  Trouble with moving (coordination). Moderate  Confusion and poor judgment.  Behavior changes.  Weakness.  Uneven heartbeats. Very low Very low blood sugar (severe hypoglycemia) is a medical emergency. It can cause:  Fainting.  Jerky movements that you cannot control (seizure).  Loss of consciousness (coma).  Death. How is this treated? Treating low blood sugar Low blood sugar is often treated by eating or drinking something sugary right away. The snack should contain 15 grams of a fast-acting carb (carbohydrate). Options include:  4 oz (120 mL) of fruit juice.  4-6 oz (120-150 mL) of regular soda (not diet soda).  8 oz (240 mL) of low-fat milk.  Several pieces of hard candy. Check food labels to find out how many to eat for 15 grams.  1 Tbsp (15 mL) of sugar or honey. Treating low blood sugar if you have diabetes If you can think clearly and swallow safely, follow the 15:15 rule:  Take 15 grams of a fast-acting carb. Talk with your doctor about how much you should take.  Always keep a source of fast-acting carb with you, such as: ? Sugar tablets (glucose pills). Take 4 pills. ? Several pieces of hard candy. Check food labels to see how many pieces to eat for 15 grams. ? 4 oz (120 mL) of fruit juice. ? 4-6 oz (120-150 mL) of regular (not diet) soda. ? 1 Tbsp (15 mL) of honey or sugar.  Check your blood sugar 15 minutes after you take the carb.  If your  blood sugar is still at or below 70 mg/dL (3.9 mmol/L), take 15 grams of a carb again.  If your blood sugar does not go above 70 mg/dL (3.9 mmol/L) after 3 tries, get help right away.  After your blood sugar goes back to normal, eat a meal or a snack within 1 hour.   Treating very low blood sugar If your blood sugar is  at or below 54 mg/dL (3 mmol/L), you have very low blood sugar, or severe hypoglycemia. This is an emergency. Get medical help right away. If you have very low blood sugar and you cannot eat or drink, you will need to be given a hormone called glucagon. A family member or friend should learn how to check your blood sugar and how to give you glucagon. Ask your doctor if you need to have an emergency glucagon kit at home. Very low blood sugar may also need to be treated in a hospital. Follow these instructions at home: General instructions  Take over-the-counter and prescription medicines only as told by your doctor.  Stay aware of your blood sugar as told by your doctor.  If you drink alcohol: ? Limit how much you use to:  0-1 drink a day for nonpregnant women.  0-2 drinks a day for men. ? Be aware of how much alcohol is in your drink. In the [place] one drink equals one 12 oz bottle of beer (355 mL), one 5 oz glass of wine (148 mL), or one 1 oz glass of hard liquor (44 mL).  Keep all follow-up visits as told by your doctor. This is important. If you have diabetes:  Always have a rapid-acting carb (15 grams) option with you to treat low blood sugar.  Follow your diabetes care plan as told by your doctor. Make sure you: ? Know the symptoms of low blood sugar. ? Check your blood sugar as often as told by your doctor. Always check it before and after exercise. ? Always check your blood sugar before you drive. ? Take your medicines as told. ? Follow your meal plan. ? Eat on time. Do not skip meals.  Share your diabetes care plan with: ? Your work or school. ? People  you live with.  Carry a card or wear jewelry that says you have diabetes.   Contact a doctor if:  You have trouble keeping your blood sugar in your target range.  You have low blood sugar often. Get help right away if:  You still have symptoms after you eat or drink something that contains 15 grams of fast-acting carb and you cannot get your blood sugar above 70 mg/dL by following the 15:15 rule.  Your blood sugar is at or below 54 mg/dL (3 mmol/L).  You have a seizure.  You faint. These symptoms may be an emergency. Do not wait to see if the symptoms will go away. Get medical help right away. Call your local emergency services (911 in the [place] Do not drive yourself to the hospital. Summary  Hypoglycemia happens when the level of sugar (glucose) in your blood is too low.  Low blood sugar can happen to people who have diabetes and people who do not have diabetes. Low blood sugar can happen quickly, and it can be an emergency.  Make sure you know the symptoms of low blood sugar and know how to treat it.  Always keep a source of sugar (fast-acting carb) with you to treat low blood sugar. This information is not intended to replace advice given to you by your health care provider. Make sure you discuss any questions you have with your health care provider. Document Revised: 12/29/2018 Document Reviewed: 12/29/2018 Elsevier Patient Education  2021 [place].  Hyperglycemia Hyperglycemia is when the sugar (glucose) level in your blood is too high. High blood sugar can happen to people who have or do not have diabetes. High blood  sugar can happen quickly. It can be an emergency. What are the causes? If you have diabetes, high blood sugar may be caused by:  Medicines that increase blood sugar or affect your control of diabetes.  Getting less physical activity.  Overeating.  Being sick or injured or having an infection.  Having surgery.  Stress.  Not giving yourself  enough insulin (if you are taking it). You may have high blood sugar because you have diabetes that has not been diagnosed yet. If you do not have diabetes, high blood sugar may be caused by:  Certain medicines.  Stress.  A bad illness.  An infection.  Having surgery.  Diseases of the pancreas. What increases the risk? This condition is more likely to develop in people who have risk factors for diabetes, such as:  Having a family member with diabetes.  Certain conditions in which the body's defense system (immune system) attacks itself. These are called autoimmune disorders.  Being overweight.  Not being active.  Having a condition called insulin resistance.  Having a history of: ? Prediabetes. ? Diabetes when pregnant. ? Polycystic ovarian syndrome (PCOS). What are the signs or symptoms? This condition may not cause symptoms. If you do have symptoms, they may include:  Feeling more thirsty than normal.  Needing to pee (urinate) more often than normal.  Hunger.  Feeling very tired.  Blurry eyesight (vision). You may get other symptoms as the condition gets worse, such as:  Dry mouth.  Pain in your belly (abdomen).  Not being hungry (loss of appetite).  Breath that smells fruity.  Weakness.  Weight loss that is not planned.  A tingling or numb feeling in your hands or feet.  A headache.  Cuts or bruises that heal slowly. How is this treated? Treatment depends on the cause of your condition. Treatment may include:  Taking medicine to control your blood sugar levels.  Changing your medicine or dosage if you take insulin or other diabetes medicines.  Lifestyle changes. These may include: ? Exercising more. ? Eating healthier foods. ? Losing weight.  Treating an illness or infection.  Checking your blood sugar more often.  Stopping or reducing steroid medicines. If your condition gets very bad, you will need to be treated in the  hospital. Follow these instructions at home: General instructions  Take over-the-counter and prescription medicines only as told by your doctor.  Do not smoke or use any products that contain nicotine or tobacco. If you need help quitting, ask your doctor.  If you drink alcohol: ? Limit how much you have to:  0-1 drink a day for women who are not pregnant.  0-2 drinks a day for men. ? Know how much alcohol is in a drink. In the [place] one drink equals one 12 oz bottle of beer (355 mL), one 5 oz glass of wine (148 mL), or one 1 oz glass of hard liquor (44 mL).  Manage stress. If you need help with this, ask your doctor.  Do exercises as told by your doctor.  Keep all follow-up visits. Eating and drinking  Stay at a healthy weight.  Make sure you drink enough fluid when you: ? Exercise. ? Get sick. ? Are in hot temperatures.  Drink enough fluid to keep your pee (urine) pale yellow.   If you have diabetes:  Know the symptoms of high blood sugar.  Follow your diabetes management plan as told by your doctor. Make sure you: ? Take insulin and medicines  as told. ? Follow your exercise plan. ? Follow your meal plan. Eat on time. Do not skip meals. ? Check your blood sugar as often as told. Make sure you check before and after exercise. If you exercise longer or in a different way, check your blood sugar more often. ? Follow your sick day plan whenever you cannot eat or drink normally. Make this plan ahead of time with your doctor.  Share your diabetes management plan with people in your workplace, school, and household.  Check your pee for ketones when you are ill and as told by your doctor.  Carry a card or wear jewelry that says that you have diabetes.   Where to find more information American Diabetes Association: www.diabetes.org Contact a doctor if:  Your blood sugar level is at or above 240 mg/dL (13.3 mmol/L) for 2 days in a row.  You have problems keeping your  blood sugar in your target range.  You have high blood pressure often.  You have signs of illness, such as: ? Feeling like you may vomit (feeling nauseous). ? Vomiting. ? A fever. Get help right away if:  Your blood sugar monitor reads "high" even when you are taking insulin.  You have trouble breathing.  You have a change in how you think, feel, or act (mental status).  You feel like you may vomit, and the feeling does not go away.  You cannot stop vomiting. These symptoms may be an emergency. Get medical help right away. Call your local emergency services (911 in the [place]  Do not wait to see if the symptoms will go away.  Do not drive yourself to the hospital. Summary  Hyperglycemia is when the sugar (glucose) level in your blood is too high.  High blood sugar can happen to people who have or do not have diabetes.  Make sure you drink enough fluids and follow your meal plan. Exercise as often as told by your doctor.  Contact your doctor if you have problems keeping your blood sugar in your target range. This information is not intended to replace advice given to you by your health care provider. Make sure you discuss any questions you have with your health care provider. Document Revised: 11/18/2019 Document Reviewed: 11/18/2019 Elsevier Patient Education  2021 [place].  Ms. Hirt was given information about Care Management services by the embedded care coordination team including:  1. Care Management services include personalized support from designated clinical staff supervised by her physician, including individualized plan of care and coordination with other care providers 2. 24/7 contact phone numbers for assistance for urgent and routine care needs. 3. The patient may stop CCM services at any time (effective at the end of the month) by phone call to the office staff.  Patient agreed to services and verbal consent obtained.   Patient verbalizes  understanding of instructions provided today and agrees to view in [place].   The patient has been provided with contact information for the care management team and has been advised to call with any health related questions or concerns.  The care management team will reach out to the patient again over the next 30 days.   Quinn Plowman RN,BSN,CCM RN Case Manager [place]  [phone]

## 2020-06-26 ENCOUNTER — Ambulatory Visit: Payer: 59 | Admitting: Physician Assistant

## 2020-06-27 ENCOUNTER — Encounter: Payer: Self-pay | Admitting: Family Medicine

## 2020-06-27 ENCOUNTER — Ambulatory Visit (INDEPENDENT_AMBULATORY_CARE_PROVIDER_SITE_OTHER): Payer: 59 | Admitting: Family Medicine

## 2020-06-27 ENCOUNTER — Encounter: Payer: Self-pay | Admitting: Endocrinology

## 2020-06-27 ENCOUNTER — Other Ambulatory Visit: Payer: Self-pay

## 2020-06-27 VITALS — BP 124/80 | HR 118 | Temp 98.1°F | Ht 68.0 in | Wt 164.6 lb

## 2020-06-27 DIAGNOSIS — J690 Pneumonitis due to inhalation of food and vomit: Secondary | ICD-10-CM | POA: Diagnosis not present

## 2020-06-27 DIAGNOSIS — E1143 Type 2 diabetes mellitus with diabetic autonomic (poly)neuropathy: Secondary | ICD-10-CM

## 2020-06-27 DIAGNOSIS — Z1159 Encounter for screening for other viral diseases: Secondary | ICD-10-CM

## 2020-06-27 DIAGNOSIS — IMO0002 Reserved for concepts with insufficient information to code with codable children: Secondary | ICD-10-CM

## 2020-06-27 DIAGNOSIS — E1165 Type 2 diabetes mellitus with hyperglycemia: Secondary | ICD-10-CM

## 2020-06-27 DIAGNOSIS — I10 Essential (primary) hypertension: Secondary | ICD-10-CM | POA: Diagnosis not present

## 2020-06-27 DIAGNOSIS — D649 Anemia, unspecified: Secondary | ICD-10-CM

## 2020-06-27 DIAGNOSIS — E1121 Type 2 diabetes mellitus with diabetic nephropathy: Secondary | ICD-10-CM | POA: Insufficient documentation

## 2020-06-27 DIAGNOSIS — E1021 Type 1 diabetes mellitus with diabetic nephropathy: Secondary | ICD-10-CM | POA: Insufficient documentation

## 2020-06-27 NOTE — Progress Notes (Signed)
[facility] [address] [place] [address] Dept: [phone] Dept Fax: [phone]  Chronic Care Office Visit  Subjective:    Patient ID: Kelli Hudson, female    DOB: Apr 14, 1990, 30 y.o..   MRN: [id number]  Chief Complaint  Patient presents with  . Follow-up    1 month fu HTN/Gastroparesis.  C/o having dizziness off/on. Needs FMLA paperwork filled out.     History of Present Illness:  Patient is in today for reassessment of chronic medical issues. Kelli Hudson notes that she has done well over the past 2 weeks. She denies any recurrence of abdominal symptoms and feels she is eating well. Her mother notes how much better she did having been prescribed a course of erythromycin with her last episode.  Kelli Hudson has a history of hypertension. At her last visit, we added HCTZ to her therapy. She is taking this in addition to her atenolol and lisinopril.  Kelli Hudson has had occasional episodes of dizziness. She and her mother wonder if this might be related to her anemia. She is not currently on any supplements related to this.  Past Medical History: Patient Active Problem List   Diagnosis Date Noted  . Diabetic nephropathy ([place]) 06/27/2020  . Sepsis ([place]) 05/27/2020  . Borderline hyperlipidemia 05/23/2020  . Gastroparesis due to DM ([place]) 05/11/2020  . 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 Tachycardia 05/09/2020  . Normocytic anemia 04/20/2020  . Hyperosmolar hyperglycemic state (HHS) ([place]) 02/24/2020  . Intractable nausea and vomiting   . Intractable abdominal pain 01/24/2020  . H. pylori infection 10/07/2019  . Depression with anxiety 07/05/2019  . Diabetic foot ulcer ([place]) 02/21/2018  . Diabetic polyneuropathy associated with type 2 diabetes mellitus ([place]) 09/24/2017  . Pain in right foot 09/24/2017  . Gastroparesis   . Leukocytosis   .  Increased frequency of urination 06/14/2011  . UTI (urinary tract infection) 06/14/2011  . Goiter 10/05/2009  . Uncontrolled diabetes mellitus ([place]) 10/05/2009  . Essential hypertension 10/05/2009   Past Surgical History:  Procedure Laterality Date  . AMPUTATION TOE Left 03/10/2018   Procedure: AMPUTATION FIFTH TOE;  Surgeon: Trula Slade, DPM;  Location: [facility];  Service: Podiatry;  Laterality: Left;  . BIOPSY  01/28/2020   Procedure: BIOPSY;  Surgeon: Otis Brace, MD;  Location: [facility];  Service: Gastroenterology;;  . ESOPHAGOGASTRODUODENOSCOPY N/A 01/28/2020   Procedure: ESOPHAGOGASTRODUODENOSCOPY (EGD);  Surgeon: Otis Brace, MD;  Location: [facility];  Service: Gastroenterology;  Laterality: N/A;  . ESOPHAGOGASTRODUODENOSCOPY (EGD) WITH PROPOFOL Left 09/08/2015   Procedure: ESOPHAGOGASTRODUODENOSCOPY (EGD) WITH PROPOFOL;  Surgeon: Arta Silence, MD;  Location: [facility];  Service: Endoscopy;  Laterality: Left;  . WISDOM TOOTH EXTRACTION     Family History  Problem Relation Age of Onset  . Lung cancer Mother   . Diabetes Mother   . Bipolar disorder Father   . Liver cancer Maternal Grandfather   . Breast cancer Other        maternal great aunt  . Heart disease Other   . Ovarian cancer Other        maternal great aunt  . Pancreatic cancer Maternal Uncle   . Prostate cancer Maternal Uncle   . Diabetes Maternal Aunt        x 2  . Kidney disease Other        maternal great aunt   Outpatient Medications Prior to Visit  Medication Sig Dispense Refill  .  atenolol (TENORMIN) 25 MG tablet Take 1 tablet (25 mg total) by mouth 2 (two) times daily. 60 tablet 2  . Blood Glucose Monitoring Suppl (TRUE METRIX METER) w/Device KIT 1 Device by Does not apply route 4 (four) times daily. 1 kit 0  . glucose blood (TRUE METRIX BLOOD GLUCOSE TEST) test strip Use as instructed 100 each 12  . hydrochlorothiazide (HYDRODIURIL) 25 MG tablet Take 1  tablet (25 mg total) by mouth daily. 90 tablet 3  . hyoscyamine (LEVBID) 0.375 MG 12 hr tablet Take 1 tablet (0.375 mg total) by mouth every 12 (twelve) hours as needed for cramping (abdominal pain). 60 tablet 0  . insulin degludec (TRESIBA FLEXTOUCH) 100 UNIT/ML FlexTouch Pen Inject 18 Units into the skin at bedtime. 5.5 mL 1  . Insulin Pen Needle 31G X 5 MM MISC 1 Device by Does not apply route QID. For use with insulin pens 100 each 0  . insulin regular (NOVOLIN R) 100 units/mL injection Inject 0-0.15 mLs (0-15 Units total) into the skin 3 (three) times daily before meals. CBG 70 - 120: 0 units CBG 121 - 150: 2 units CBG 151 - 200: 3 units CBG 201 - 250: 5 units CBG 251 - 300: 8 units CBG 301 - 350: 11 units CBG 351 - 400: 15 units 10 mL 0  . levalbuterol (XOPENEX HFA) 45 MCG/ACT inhaler Inhale 2 puffs into the lungs every 6 (six) hours as needed for wheezing or shortness of breath. 1 each 0  . lisinopril (ZESTRIL) 10 MG tablet Take 1 tablet (10 mg total) by mouth daily. 30 tablet 1  . metoCLOPramide (REGLAN) 5 MG tablet Take 1 tablet (5 mg total) by mouth 3 (three) times daily before meals. 90 tablet 5  . Multiple Vitamin (MULTIVITAMIN WITH MINERALS) TABS tablet Take 1 tablet by mouth daily.    . ondansetron (ZOFRAN ODT) 4 MG disintegrating tablet Take 1 tablet (4 mg total) by mouth every 6 (six) hours as needed for nausea or vomiting. 40 tablet 6  . pantoprazole (PROTONIX) 40 MG tablet Take 1 tablet (40 mg total) by mouth 2 (two) times daily. 60 tablet 0  . promethazine (PHENERGAN) 12.5 MG tablet Take 1 tablet (12.5 mg total) by mouth every 6 (six) hours as needed for nausea or vomiting. 30 tablet 0  . TRUEPLUS LANCETS 28G MISC assist with checking blood sugar TID and qhs 100 each 3   No facility-administered medications prior to visit.   No Known Allergies    Objective:   Today's Vitals   06/27/20 1305  BP: 124/80  Pulse: (!) 118  Temp: 98.1 F (36.7 C)  TempSrc: Temporal   SpO2: 99%  Weight: 164 lb 9.6 oz (74.7 kg)  Height: 5' 8"  (1.727 m)   Body mass index is 25.03 kg/m.   General: Well developed, well nourished. No acute distress. Psych: Alert and oriented. Normal mood and affect.  Health Maintenance Due  Topic Date Due  . OPHTHALMOLOGY EXAM  Never done  . Hepatitis C Screening  Never done  . PAP SMEAR-Modifier  Never done  . COVID-19 Vaccine (3 - Booster for Pfizer series) 06/26/2020     Assessment & Plan:   1. 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 Johnson-Alston's diabetes at this visit. She is currently on Tresiba (insulin degludec) 18 units at bedtime, Novolog (insulin aspart) 5 units TID with meals, and has a sliding scale of regular insulin 3 times a day.  Her gastroparesis is stable on Reglan currently. It is helpful to know that she responded well to erythromycin with her last episode. We would consider using this episodically as needed for acute exacerbations in the future.  Patient brough FMLA paperwork, which we will complete on her behalf.  - Glucose, random - Hemoglobin A1c  2. Essential hypertension Blood pressure is much improved at this point. We will continue lisinopril, atenolol, and HCTZ.  3. Normocytic anemia Ms. Johnson-Alston has had a persistent anemia, though there appears to have been gradual improvement. I will reassess her CBC and an Iron panel to assess for need for potential supplementation.  - CBC - Iron,Total/Total Iron Binding Cap  4. Encounter for hepatitis C screening test for low risk patient  - HCV Ab w Reflex to Quant PCR    [name], MD

## 2020-06-28 LAB — BASIC METABOLIC PANEL
BUN: 22 mg/dL (ref 6–23)
CO2: 27 mEq/L (ref 19–32)
Calcium: 9.8 mg/dL (ref 8.4–10.5)
Chloride: 99 mEq/L (ref 96–112)
Creatinine, Ser: 0.94 mg/dL (ref 0.40–1.20)
GFR: 82.25 mL/min (ref >60.00)
Glucose, Bld: 392 mg/dL — ABNORMAL HIGH (ref 70–99)
Potassium: 4.4 mEq/L (ref 3.5–5.1)
Sodium: 135 mEq/L (ref 135–145)

## 2020-06-28 LAB — CBC
HCT: 33.8 % — ABNORMAL LOW (ref 36.0–46.0)
Hemoglobin: 11.2 g/dL — ABNORMAL LOW (ref 12.0–15.0)
MCHC: 33 g/dL (ref 30.0–36.0)
MCV: 90.7 fl (ref 78.0–100.0)
Platelets: 328 10*3/uL (ref 150.0–400.0)
RBC: 3.73 Mil/uL — ABNORMAL LOW (ref 3.87–5.11)
RDW: 14 % (ref 11.5–15.5)
WBC: 6.2 10*3/uL (ref 4.0–10.5)

## 2020-06-28 LAB — IRON, TOTAL/TOTAL IRON BINDING CAP
%SAT: 17 % (calc) (ref 16–45)
Iron: 80 ug/dL (ref 40–190)
TIBC: 459 mcg/dL (calc) — ABNORMAL HIGH (ref 250–450)

## 2020-06-28 LAB — HCV INTERPRETATION

## 2020-06-28 LAB — HEMOGLOBIN A1C: Hgb A1c MFr Bld: 10.2 % — ABNORMAL HIGH (ref 4.6–6.5)

## 2020-06-28 LAB — HCV AB W REFLEX TO QUANT PCR: HCV Ab: <0.1 s/co ratio (ref 0.0–0.9)

## 2020-07-03 ENCOUNTER — Telehealth: Payer: Self-pay

## 2020-07-03 NOTE — Telephone Encounter (Signed)
Spoke to patient and advised that the [facility] paperwork has been faxed to [phone].  Original placed up front for pick up.  Copy was placed in scan folder. Dm/cma

## 2020-07-04 NOTE — Progress Notes (Signed)
Kelli Hudson,  Can you reach out to this patient and see if we can help her get set up with patient assistance for her insulin?  Thanks!

## 2020-07-10 ENCOUNTER — Telehealth: Payer: Self-pay

## 2020-07-10 NOTE — Chronic Care Management (AMB) (Signed)
    Chronic Care Management Pharmacy Assistant   Name: Etter Royall  MRN: [id number] DOB: 1990/04/07  Reason for Encounter: Patient Assistance Coordination   07/10/2020- Called patient to see if she needed any assistance with getting her medications. No answer, left message to return call.    SIG: Billee Cashing, CMA Clinical Pharmacist Assistant [phone]

## 2020-07-11 ENCOUNTER — Telehealth: Payer: Self-pay

## 2020-07-11 ENCOUNTER — Telehealth: Payer: 59

## 2020-07-11 NOTE — Telephone Encounter (Signed)
  Care Management   Follow Up Note   07/11/2020 Name: Kelli Hudson MRN: [id number] DOB: 08-12-1990   Referred by: Loyola Mast, MD Reason for referral : Care Coordination (Diabetes, Gastroparesis, )   An unsuccessful telephone outreach was attempted today. The patient was referred to the case management team for assistance with care management and care coordination.  Unable to leave message due to voice mail box being full.   Follow Up Plan: A HIPPA compliant phone message was left for the patient providing contact information and requesting a return call.   George Ina RN,BSN,CCM RN Case Manager [facility] [phone]

## 2020-07-23 ENCOUNTER — Observation Stay (HOSPITAL_BASED_OUTPATIENT_CLINIC_OR_DEPARTMENT_OTHER)
Admission: EM | Admit: 2020-07-23 | Discharge: 2020-07-24 | Disposition: A | Discharge status: Home / Self Care | Payer: 59 | Source: Home / Self Care | Attending: Emergency Medicine | Admitting: Emergency Medicine

## 2020-07-23 ENCOUNTER — Encounter (HOSPITAL_COMMUNITY): Payer: Self-pay | Admitting: Emergency Medicine

## 2020-07-23 DIAGNOSIS — E1165 Type 2 diabetes mellitus with hyperglycemia: Secondary | ICD-10-CM

## 2020-07-23 DIAGNOSIS — K3184 Gastroparesis: Secondary | ICD-10-CM

## 2020-07-23 DIAGNOSIS — Z79899 Other long term (current) drug therapy: Secondary | ICD-10-CM | POA: Insufficient documentation

## 2020-07-23 DIAGNOSIS — I10 Essential (primary) hypertension: Secondary | ICD-10-CM | POA: Insufficient documentation

## 2020-07-23 DIAGNOSIS — K219 Gastro-esophageal reflux disease without esophagitis: Secondary | ICD-10-CM

## 2020-07-23 DIAGNOSIS — E1043 Type 1 diabetes mellitus with diabetic autonomic (poly)neuropathy: Secondary | ICD-10-CM | POA: Diagnosis not present

## 2020-07-23 DIAGNOSIS — IMO0002 Reserved for concepts with insufficient information to code with codable children: Secondary | ICD-10-CM | POA: Diagnosis present

## 2020-07-23 DIAGNOSIS — R112 Nausea with vomiting, unspecified: Secondary | ICD-10-CM | POA: Diagnosis not present

## 2020-07-23 DIAGNOSIS — E1143 Type 2 diabetes mellitus with diabetic autonomic (poly)neuropathy: Secondary | ICD-10-CM | POA: Diagnosis present

## 2020-07-23 DIAGNOSIS — E119 Type 2 diabetes mellitus without complications: Secondary | ICD-10-CM | POA: Diagnosis present

## 2020-07-23 DIAGNOSIS — E1159 Type 2 diabetes mellitus with other circulatory complications: Secondary | ICD-10-CM | POA: Diagnosis present

## 2020-07-23 DIAGNOSIS — Z794 Long term (current) use of insulin: Secondary | ICD-10-CM | POA: Insufficient documentation

## 2020-07-23 DIAGNOSIS — R739 Hyperglycemia, unspecified: Secondary | ICD-10-CM

## 2020-07-23 DIAGNOSIS — R1084 Generalized abdominal pain: Secondary | ICD-10-CM | POA: Insufficient documentation

## 2020-07-23 DIAGNOSIS — U071 COVID-19: Secondary | ICD-10-CM | POA: Insufficient documentation

## 2020-07-23 DIAGNOSIS — R109 Unspecified abdominal pain: Secondary | ICD-10-CM | POA: Insufficient documentation

## 2020-07-23 DIAGNOSIS — I152 Hypertension secondary to endocrine disorders: Secondary | ICD-10-CM | POA: Diagnosis present

## 2020-07-23 LAB — URINALYSIS, ROUTINE W REFLEX MICROSCOPIC
Bacteria, UA: NONE SEEN
Bilirubin Urine: NEGATIVE
Glucose, UA: >=500 mg/dL — AB
Ketones, ur: 20 mg/dL — AB
Leukocytes,Ua: NEGATIVE
Nitrite: NEGATIVE
Protein, ur: 100 mg/dL — AB
Specific Gravity, Urine: 1.025 (ref 1.005–1.030)
pH: 5 (ref 5.0–8.0)

## 2020-07-23 LAB — I-STAT BETA HCG BLOOD, ED (MC, WL, AP ONLY): I-stat hCG, quantitative: <5 m[IU]/mL (ref <5)

## 2020-07-23 LAB — CBC
HCT: 37.6 % (ref 36.0–46.0)
Hemoglobin: 12.9 g/dL (ref 12.0–15.0)
MCH: 29.7 pg (ref 26.0–34.0)
MCHC: 34.3 g/dL (ref 30.0–36.0)
MCV: 86.4 fL (ref 80.0–100.0)
Platelets: 348 10*3/uL (ref 150–400)
RBC: 4.35 MIL/uL (ref 3.87–5.11)
RDW: 12.4 % (ref 11.5–15.5)
WBC: 8.1 10*3/uL (ref 4.0–10.5)
nRBC: 0 % (ref 0.0–0.2)

## 2020-07-23 LAB — COMPREHENSIVE METABOLIC PANEL
ALT: 32 U/L (ref 0–44)
AST: 30 U/L (ref 15–41)
Albumin: 4.5 g/dL (ref 3.5–5.0)
Alkaline Phosphatase: 82 U/L (ref 38–126)
Anion gap: 13 (ref 5–15)
BUN: 26 mg/dL — ABNORMAL HIGH (ref 6–20)
CO2: 22 mmol/L (ref 22–32)
Calcium: 10.3 mg/dL (ref 8.9–10.3)
Chloride: 98 mmol/L (ref 98–111)
Creatinine, Ser: 1.1 mg/dL — ABNORMAL HIGH (ref 0.44–1.00)
GFR, Estimated: >60 mL/min (ref >60)
Glucose, Bld: 428 mg/dL — ABNORMAL HIGH (ref 70–99)
Potassium: 4.7 mmol/L (ref 3.5–5.1)
Sodium: 133 mmol/L — ABNORMAL LOW (ref 135–145)
Total Bilirubin: 1.4 mg/dL — ABNORMAL HIGH (ref 0.3–1.2)
Total Protein: 8.8 g/dL — ABNORMAL HIGH (ref 6.5–8.1)

## 2020-07-23 LAB — CBG MONITORING, ED
Glucose-Capillary: 390 mg/dL — ABNORMAL HIGH (ref 70–99)
Glucose-Capillary: 271 mg/dL — ABNORMAL HIGH (ref 70–99)

## 2020-07-23 LAB — RAPID URINE DRUG SCREEN, HOSP PERFORMED
Amphetamines: NOT DETECTED
Barbiturates: NOT DETECTED
Benzodiazepines: NOT DETECTED
Cocaine: NOT DETECTED
Opiates: NOT DETECTED
Tetrahydrocannabinol: NOT DETECTED

## 2020-07-23 LAB — LIPASE, BLOOD: Lipase: 24 U/L (ref 11–51)

## 2020-07-23 MED ORDER — HALOPERIDOL LACTATE 5 MG/ML IJ SOLN
2.0000 mg | Freq: Once | INTRAMUSCULAR | Status: AC
  Administered 2020-07-23: 2 mg via INTRAVENOUS
  Filled 2020-07-23: qty 1

## 2020-07-23 MED ORDER — SODIUM CHLORIDE 0.9 % IV BOLUS
1000.0000 mL | Freq: Once | INTRAVENOUS | Status: AC
  Administered 2020-07-23: 1000 mL via INTRAVENOUS

## 2020-07-23 MED ORDER — HYDROMORPHONE HCL 1 MG/ML IJ SOLN
1.0000 mg | Freq: Once | INTRAMUSCULAR | Status: AC
  Administered 2020-07-23: 1 mg via INTRAVENOUS
  Filled 2020-07-23: qty 1

## 2020-07-23 MED ORDER — METOPROLOL TARTRATE 5 MG/5ML IV SOLN
5.0000 mg | Freq: Once | INTRAVENOUS | Status: AC
  Administered 2020-07-23: 5 mg via INTRAVENOUS
  Filled 2020-07-23: qty 5

## 2020-07-23 MED ORDER — INSULIN ASPART 100 UNIT/ML IJ SOLN
8.0000 [IU] | Freq: Once | INTRAMUSCULAR | Status: AC
  Administered 2020-07-23: 8 [IU] via SUBCUTANEOUS
  Filled 2020-07-23: qty 0.08

## 2020-07-23 MED ORDER — ONDANSETRON HCL 4 MG/2ML IJ SOLN
4.0000 mg | Freq: Once | INTRAMUSCULAR | Status: AC
  Administered 2020-07-23: 4 mg via INTRAVENOUS
  Filled 2020-07-23: qty 2

## 2020-07-23 MED ORDER — SODIUM CHLORIDE 0.9 % IV SOLN
250.0000 mg | Freq: Four times a day (QID) | INTRAVENOUS | Status: DC
  Administered 2020-07-23 – 2020-07-24 (×2): 250 mg via INTRAVENOUS
  Filled 2020-07-23 (×4): qty 5

## 2020-07-23 MED ORDER — INSULIN ASPART 100 UNIT/ML IJ SOLN
0.0000 [IU] | INTRAMUSCULAR | Status: DC
  Administered 2020-07-23: 8 [IU] via SUBCUTANEOUS
  Administered 2020-07-24 (×2): 5 [IU] via SUBCUTANEOUS
  Filled 2020-07-23: qty 0.15

## 2020-07-23 MED ORDER — LORAZEPAM 2 MG/ML IJ SOLN
1.0000 mg | Freq: Once | INTRAMUSCULAR | Status: AC
  Administered 2020-07-23: 1 mg via INTRAVENOUS
  Filled 2020-07-23: qty 1

## 2020-07-23 MED ORDER — METOCLOPRAMIDE HCL 5 MG/ML IJ SOLN
10.0000 mg | Freq: Once | INTRAMUSCULAR | Status: AC
  Administered 2020-07-23: 10 mg via INTRAVENOUS
  Filled 2020-07-23: qty 2

## 2020-07-23 MED ORDER — HYDRALAZINE HCL 20 MG/ML IJ SOLN
5.0000 mg | Freq: Four times a day (QID) | INTRAMUSCULAR | Status: DC | PRN
  Administered 2020-07-23: 5 mg via INTRAVENOUS
  Filled 2020-07-23: qty 1

## 2020-07-23 MED ORDER — DIPHENHYDRAMINE HCL 50 MG/ML IJ SOLN
25.0000 mg | Freq: Once | INTRAMUSCULAR | Status: AC
  Administered 2020-07-23: 25 mg via INTRAVENOUS
  Filled 2020-07-23: qty 1

## 2020-07-23 MED ORDER — METOCLOPRAMIDE HCL 5 MG/ML IJ SOLN
5.0000 mg | Freq: Three times a day (TID) | INTRAMUSCULAR | Status: DC
  Administered 2020-07-23 – 2020-07-24 (×2): 5 mg via INTRAVENOUS
  Filled 2020-07-23 (×2): qty 2

## 2020-07-23 MED ORDER — SODIUM CHLORIDE 0.9 % IV SOLN: INTRAVENOUS | Status: DC

## 2020-07-23 MED ORDER — KETOROLAC TROMETHAMINE 30 MG/ML IJ SOLN
30.0000 mg | Freq: Once | INTRAMUSCULAR | Status: AC
  Administered 2020-07-23: 30 mg via INTRAVENOUS
  Filled 2020-07-23: qty 1

## 2020-07-23 MED ORDER — ENOXAPARIN SODIUM 40 MG/0.4ML IJ SOSY
40.0000 mg | PREFILLED_SYRINGE | INTRAMUSCULAR | Status: DC
  Administered 2020-07-23: 40 mg via SUBCUTANEOUS
  Filled 2020-07-23: qty 0.4

## 2020-07-23 NOTE — ED Triage Notes (Signed)
Per pt, states gastroparesis since last night-unable to keep anything down

## 2020-07-23 NOTE — H&P (Signed)
History and Physical    Kelli Hudson MRN:[id number] DOB: 04/09/1990 DOA: 07/23/2020  PCP: Rudd, Stephen M, MD  Patient coming from: Home  I have personally briefly reviewed patient's old medical records in [facility]  Chief Complaint: persistent nausea and vomiting  HPI: Kelli Hudson is a 30 y.o. female with medical history significant for type 1 diabetes with gastroparesis, hypertension, depression/anxiety, recent COVID infection on 4/10 who presents with persistent nausea and vomiting.  Limited history from patient as she was drowsy from receiving numerous sedating medication prior to my evaluation.  She reports that her symptoms of nausea and vomiting started early this morning.  Had abdominal gurgling.  No diarrhea.  Reports that she last took her prescription medications last night and had insulin this morning.  Did not answer how many units.  No fever.  Denies any history of abdominal surgery.  No tobacco or alcohol use.  Did not answer when asked about illicit drugs.  ED Course:  She was afebrile but tachycardic with heart rate of 120s -130s and hypertensive up to systolic of 170s. Pt given Haldol, Benadry, Ativan, Reglan for antiemetic. Given Toradol, Dilaudid for pain. Lopressor also given for elevated BP and tachycardia since she has been unable to take her BP medications.   Review of Systems:  Unable to fully obtain given patient's drowsiness and lethargy  Past Medical History:  Diagnosis Date  . Acute H. pylori gastric ulcer   . Diabetes mellitus (HCC)   . Diabetic gastroparesis (HCC)   . Gastroparesis   . GERD (gastroesophageal reflux disease)   . Hypertension   . Hyperthyroidism     Past Surgical History:  Procedure Laterality Date  . AMPUTATION TOE Left 03/10/2018   Procedure: AMPUTATION FIFTH TOE;  Surgeon: Wagoner, Matthew R, DPM;  Location: [facility];  Service: Podiatry;  Laterality: Left;  . BIOPSY  01/28/2020   Procedure:  BIOPSY;  Surgeon: Brahmbhatt, Parag, MD;  Location: [facility];  Service: Gastroenterology;;  . ESOPHAGOGASTRODUODENOSCOPY N/A 01/28/2020   Procedure: ESOPHAGOGASTRODUODENOSCOPY (EGD);  Surgeon: Brahmbhatt, Parag, MD;  Location: [facility];  Service: Gastroenterology;  Laterality: N/A;  . ESOPHAGOGASTRODUODENOSCOPY (EGD) WITH PROPOFOL Left 09/08/2015   Procedure: ESOPHAGOGASTRODUODENOSCOPY (EGD) WITH PROPOFOL;  Surgeon: William Outlaw, MD;  Location: [facility];  Service: Endoscopy;  Laterality: Left;  . WISDOM TOOTH EXTRACTION       reports that she has never smoked. She has never used smokeless tobacco. She reports current alcohol use of about 1.0 standard drink of alcohol per week. She reports that she does not use drugs. Social History  No Known Allergies  Family History  Problem Relation Age of Onset  . Lung cancer Mother   . Diabetes Mother   . Bipolar disorder Father   . Liver cancer Maternal Grandfather   . Breast cancer Other        maternal great aunt  . Heart disease Other   . Ovarian cancer Other        maternal great aunt  . Pancreatic cancer Maternal Uncle   . Prostate cancer Maternal Uncle   . Diabetes Maternal Aunt        x 2  . Kidney disease Other        maternal great aunt     Prior to Admission medications   Medication Sig Start Date End Date Taking? Authorizing Provider  atenolol (TENORMIN) 25 MG tablet Take 1 tablet (25 mg total) by mouth 2 (two) times daily. 01/30/20  Yes Nettey,   Evalee Jefferson, MD  erythromycin (ERY-TAB) 250 MG EC tablet Take 250 mg by mouth daily.   Yes [provider]  famotidine (PEPCID) 20 MG tablet Take 20 mg by mouth 2 (two) times daily. 07/03/20  Yes [provider]  insulin degludec (TRESIBA FLEXTOUCH) 100 UNIT/ML FlexTouch Pen Inject 18 Units into the skin at bedtime. 06/09/20 08/09/20 Yes Cherene Altes, MD  Insulin Pen Needle 31G X 5 MM MISC 1 Device by Does not apply route QID. For use with insulin pens  07/05/19  Yes Libby Maw, MD  lisinopril (ZESTRIL) 10 MG tablet Take 1 tablet (10 mg total) by mouth daily. 06/10/20  Yes Cherene Altes, MD  metoCLOPramide (REGLAN) 5 MG tablet Take 1 tablet (5 mg total) by mouth 3 (three) times daily before meals. 05/21/20  Yes Esterwood, Amy S, PA-C  promethazine (PHENERGAN) 12.5 MG tablet Take 1 tablet (12.5 mg total) by mouth every 6 (six) hours as needed for nausea or vomiting. 05/14/20  Yes Sheikh, Omair Latif, DO  Blood Glucose Monitoring Suppl (TRUE METRIX METER) w/Device KIT 1 Device by Does not apply route 4 (four) times daily. 07/05/19   Libby Maw, MD  glucose blood (TRUE METRIX BLOOD GLUCOSE TEST) test strip Use as instructed 07/05/19   Libby Maw, MD  hydrochlorothiazide (HYDRODIURIL) 25 MG tablet Take 1 tablet (25 mg total) by mouth daily. 06/14/20   [name], MD  hyoscyamine (LEVBID) 0.375 MG 12 hr tablet Take 1 tablet (0.375 mg total) by mouth every 12 (twelve) hours as needed for cramping (abdominal pain). 05/14/20   Raiford Noble Latif, DO  insulin regular (NOVOLIN R) 100 units/mL injection Inject 0-0.15 mLs (0-15 Units total) into the skin 3 (three) times daily before meals. CBG 70 - 120: 0 units CBG 121 - 150: 2 units CBG 151 - 200: 3 units CBG 201 - 250: 5 units CBG 251 - 300: 8 units CBG 301 - 350: 11 units CBG 351 - 400: 15 units 05/14/20   Sheikh, Omair Latif, DO  levalbuterol [facility] HFA) 45 MCG/ACT inhaler Inhale 2 puffs into the lungs every 6 (six) hours as needed for wheezing or shortness of breath. 05/28/20   Little Ishikawa, MD  Multiple Vitamin (MULTIVITAMIN WITH MINERALS) TABS tablet Take 1 tablet by mouth daily.    [provider]  ondansetron (ZOFRAN ODT) 4 MG disintegrating tablet Take 1 tablet (4 mg total) by mouth every 6 (six) hours as needed for nausea or vomiting. 04/20/20   Esterwood, Amy S, PA-C  pantoprazole (PROTONIX) 40 MG tablet Take 1 tablet (40 mg total) by mouth 2  (two) times daily. 05/14/20   Raiford Noble Latif, DO  TRUEPLUS LANCETS 28G MISC assist with checking blood sugar TID and qhs 09/10/15   Zettie Pho S, PA-C  insulin aspart (NOVOLOG) 100 UNIT/ML FlexPen Inject 5 Units into the skin 3 (three) times daily with meals. 02/23/18 07/05/19  Donne Hazel, MD    Physical Exam: Vitals:   07/23/20 1900 07/23/20 1930 07/23/20 2009 07/23/20 2017  BP: (!) 174/119 (!) 166/112 (!) 176/126   Pulse: (!) 128 (!) 125 (!) 126 (!) 126  Resp: _0 Temp:      TempSrc:      SpO2: 100% 98% 99% 100%  Weight:      Height:        Constitutional: NAD, calm, comfortable, young female laying in bed asleep and would repeatedly fall asleep during evaluation  Vitals:   07/23/20 1900 07/23/20 1930 07/23/20 2009 07/23/20 2017  BP: (!) 174/119 (!) 166/112 (!) 176/126   Pulse: (!) 128 (!) 125 (!) 126 (!) 126  Resp: 17 15 13 14  Temp:      TempSrc:      SpO2: 100% 98% 99% 100%  Weight:      Height:       Eyes: Pt did not open eyes for exam ENMT: Mucous membranes are moist.  Neck: normal, supple Respiratory: clear to auscultation bilaterally, no wheezing, no crackles. Normal respiratory effort. No accessory muscle use.  Cardiovascular: tachycardia, no murmurs / rubs / gallops. No extremity edema.   Abdomen: pt asleep during exam but no grimacing with exam, no masses palpated. Bowel sounds positive.  Musculoskeletal: no clubbing / cyanosis. No joint deformity upper and lower extremities. Good ROM, no contractures. Normal muscle tone.  Skin: no rashes, lesions, ulcers. No induration Neurologic: CN 2-12 grossly intact. Sensation intact. Psychiatric: Normal judgment and insight. Alert and oriented x 3. Normal mood.     Labs on Admission: I have personally reviewed following labs and imaging studies  CBC: Recent Labs  Lab 07/23/20 1646  WBC 8.1  HGB 12.9  HCT 37.6  MCV 86.4  PLT 348   Basic Metabolic Panel: Recent Labs  Lab 07/23/20 1646  NA 133*   K 4.7  CL 98  CO2 22  GLUCOSE 428*  BUN 26*  CREATININE 1.10*  CALCIUM 10.3   GFR: Estimated Creatinine Clearance: 76.1 mL/min (A) (by C-G formula based on SCr of 1.1 mg/dL (H)). Liver Function Tests: Recent Labs  Lab 07/23/20 1646  AST 30  ALT 32  ALKPHOS 82  BILITOT 1.4*  PROT 8.8*  ALBUMIN 4.5   Recent Labs  Lab 07/23/20 1646  LIPASE 24   No results for input(s): AMMONIA in the last 168 hours. Coagulation Profile: No results for input(s): INR, PROTIME in the last 168 hours. Cardiac Enzymes: No results for input(s): CKTOTAL, CKMB, CKMBINDEX, TROPONINI in the last 168 hours. BNP (last 3 results) No results for input(s): PROBNP in the last 8760 hours. HbA1C: No results for input(s): HGBA1C in the last 72 hours. CBG: Recent Labs  Lab 07/23/20 1408  GLUCAP 390*   Lipid Profile: No results for input(s): CHOL, HDL, LDLCALC, TRIG, CHOLHDL, LDLDIRECT in the last 72 hours. Thyroid Function Tests: No results for input(s): TSH, T4TOTAL, FREET4, T3FREE, THYROIDAB in the last 72 hours. Anemia Panel: No results for input(s): VITAMINB12, FOLATE, FERRITIN, TIBC, IRON, RETICCTPCT in the last 72 hours. Urine analysis:    Component Value Date/Time   COLORURINE YELLOW 06/05/2020 1725   APPEARANCEUR HAZY (A) 06/05/2020 1725   LABSPEC 1.020 06/05/2020 1725   PHURINE 5.0 06/05/2020 1725   GLUCOSEU >=500 (A) 06/05/2020 1725   GLUCOSEU NEGATIVE 05/22/2020 1417   HGBUR SMALL (A) 06/05/2020 1725   BILIRUBINUR NEGATIVE 06/05/2020 1725   KETONESUR 20 (A) 06/05/2020 1725   PROTEINUR >=300 (A) 06/05/2020 1725   UROBILINOGEN 0.2 05/22/2020 1417   NITRITE NEGATIVE 06/05/2020 1725   LEUKOCYTESUR NEGATIVE 06/05/2020 1725    Radiological Exams on Admission: No results found.    Assessment/Plan Acute gastroparesis flare -Diagnosed by gastric emptying study on 05/16/2020 -Start scheduled Reglan and IV erythromycin -Strict CBG control  AKI - Creatinine of 1.10.  Continuous IV  fluids and monitor with repeat labs in the morning.  Uncontrolled type 1 diabetes with chronic diabetic gastroparesis with hyperglycemia -Hemoglobin A1c of 10.2 on 06/27/2020 -q4 hr   Accu-Cheks with moderate sliding scale  Hypertension - Uncontrolled -IV as needed hydralazine until she can tolerate home medications  GERD - Resume PPI when able to tolerate p.o.  DVT prophylaxis:.Lovenox Code Status: Full Family Communication: Plan discussed with patient at bedside  disposition Plan: Home with observation Consults called:  Admission status: Observation Level of care: Telemetry  Status is: Observation  The patient remains OBS appropriate and will d/c before 2 midnights.  Dispo: The patient is from: Home              Anticipated d/c is to: Home              Patient currently is not medically stable to d/c.   Difficult to place patient No         Orene Desanctis DO [facility]   If 7PM-7AM, please contact night-coverage [email or web address]   07/23/2020, 8:42 PM

## 2020-07-23 NOTE — ED Notes (Signed)
Pt ambulated to restroom to void.  Pt with unsteady gait, RN assisted pt to restroom during ambulation.  Pt urinated, sent urine down to lab.

## 2020-07-23 NOTE — ED Provider Notes (Signed)
Emergency Medicine Provider Triage Evaluation Note  Kelli Hudson , a 30 y.o. female  was evaluated in triage.  Pt complains of nausea and vomiting states that she is having a gastroparesis flare.  She states that she started having some discomfort in her abdomen last night but states that she began vomiting today around 11 AM.  She states her symptoms of been persistent since.  She states that has been nonbloody nonbilious emesis.  Denies any chest pain or shortness of breath no lightheadedness or dizziness.  Patient has a history of gastroparesis she had in the gastric imaging setting done 05/16/2020 which confirmed gastroparesis likely secondary to diabetes.  Review of Systems  Positive: Nausea vomiting, abdominal pain Negative: Fevers, chills  Physical Exam  BP (!) 146/101 (BP Location: Right Arm)   Pulse (!) 128   Temp 99.2 F (37.3 C) (Oral)   Resp 18   Ht 5\' 8"  (1.727 m)   Wt 70.6 kg   LMP 05/23/2020   SpO2 99%   BMI 23.65 kg/m  Gen:   Uncomfortable appearing 30 year old female holding emesis bag Resp:  Normal effort Heart: Tachycardic MSK:   Moves extremities without difficulty Other:  Abdomen is soft no focal tenderness, guarding or rebound.  Medical Decision Making  Medically screening exam initiated at 2:51 PM.  Appropriate orders placed.  Kelli Hudson was informed that the remainder of the evaluation will be completed by another provider, this initial triage assessment does not replace that evaluation, and the importance of remaining in the ED until their evaluation is complete.  Patient with history of gastroparesis states that she usually requires multiple different antiemetics to manage her symptoms.  Given her elevated blood sugar, dehydration, tachycardia recommend she be moved to major care expediently    [name], PA 07/23/20 1453    09/22/20, DO 07/23/20 1518

## 2020-07-23 NOTE — ED Provider Notes (Signed)
[facility] DEPT Provider Note   CSN: [id number] Arrival date & time: 07/23/20  1358     History Chief Complaint  Patient presents with  . Abdominal Pain    Kelli Hudson is a 30 y.o. female.  HPI   Patient presents to the ED for evaluation of nausea vomiting and abdominal pain that started this morning.  Patient states she started having pain in her upper abdomen.  It goes up into her chest.  She has had multiple episodes of vomiting.  She cannot keep track.  She denies any diarrhea.  No dysuria.  No fever.  Patient has a history of gastroparesis.  She has had episodes like this in the past.  Patient was admitted to the hospital in April of this year for similar symptoms. Past Medical History:  Diagnosis Date  . Acute H. pylori gastric ulcer   . Diabetes mellitus ([place])   . Diabetic gastroparesis ([place])   . Gastroparesis   . GERD (gastroesophageal reflux disease)   . Hypertension   . Hyperthyroidism     Patient Active Problem List   Diagnosis Date Noted  . Diabetic nephropathy ([place]) 06/27/2020  . Sepsis ([place]) 05/27/2020  . Borderline hyperlipidemia 05/23/2020  . Gastroparesis due to DM ([place]) 05/11/2020  . 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 Tachycardia 05/09/2020  . Normocytic anemia 04/20/2020  . Hyperosmolar hyperglycemic state (HHS) ([place]) 02/24/2020  . Intractable nausea and vomiting   . Intractable abdominal pain 01/24/2020  . H. pylori infection 10/07/2019  . Depression with anxiety 07/05/2019  . Diabetic foot ulcer ([place]) 02/21/2018  . Diabetic polyneuropathy associated with type 2 diabetes mellitus ([place]) 09/24/2017  . Pain in right foot 09/24/2017  . Gastroparesis   . Leukocytosis   . Increased frequency of urination 06/14/2011  . UTI (urinary tract infection) 06/14/2011  . Goiter 10/05/2009  . Uncontrolled diabetes mellitus ([place]) 10/05/2009  . Essential hypertension 10/05/2009     Past Surgical History:  Procedure Laterality Date  . AMPUTATION TOE Left 03/10/2018   Procedure: AMPUTATION FIFTH TOE;  Surgeon: Trula Slade, DPM;  Location: [facility];  Service: Podiatry;  Laterality: Left;  . BIOPSY  01/28/2020   Procedure: BIOPSY;  Surgeon: Otis Brace, MD;  Location: [facility];  Service: Gastroenterology;;  . ESOPHAGOGASTRODUODENOSCOPY N/A 01/28/2020   Procedure: ESOPHAGOGASTRODUODENOSCOPY (EGD);  Surgeon: Otis Brace, MD;  Location: [facility];  Service: Gastroenterology;  Laterality: N/A;  . ESOPHAGOGASTRODUODENOSCOPY (EGD) WITH PROPOFOL Left 09/08/2015   Procedure: ESOPHAGOGASTRODUODENOSCOPY (EGD) WITH PROPOFOL;  Surgeon: Arta Silence, MD;  Location: [facility]COPY;  Service: Endoscopy;  Laterality: Left;  . WISDOM TOOTH EXTRACTION       OB History    Gravida  0   Para  0   Term  0   Preterm  0   AB  0   Living  0     SAB  0   IAB  0   Ectopic  0   Multiple  0   Live Births  0           Family History  Problem Relation Age of Onset  . Lung cancer Mother   . Diabetes Mother   . Bipolar disorder Father   . Liver cancer Maternal Grandfather   . Breast cancer Other        maternal great aunt  . Heart disease Other   . Ovarian cancer Other  maternal great aunt  . Pancreatic cancer Maternal Uncle   . Prostate cancer Maternal Uncle   . Diabetes Maternal Aunt        x 2  . Kidney disease Other        maternal great aunt    Social History   Tobacco Use  . Smoking status: Never Smoker  . Smokeless tobacco: Never Used  Vaping Use  . Vaping Use: Never used  Substance Use Topics  . Alcohol use: Yes    Alcohol/week: 1.0 standard drink    Types: 1 Shots of liquor per week    Comment: occasional   . Drug use: No    Home Medications Prior to Admission medications   Medication Sig Start Date End Date Taking? Authorizing Provider  atenolol (TENORMIN) 25 MG tablet Take 1 tablet (25 mg  total) by mouth 2 (two) times daily. 01/30/20  Yes Mariel Aloe, MD  erythromycin (ERY-TAB) 250 MG EC tablet Take 250 mg by mouth daily.   Yes [provider]  famotidine (PEPCID) 20 MG tablet Take 20 mg by mouth 2 (two) times daily. 07/03/20  Yes [provider]  insulin degludec (TRESIBA FLEXTOUCH) 100 UNIT/ML FlexTouch Pen Inject 18 Units into the skin at bedtime. 06/09/20 08/09/20 Yes Cherene Altes, MD  Insulin Pen Needle 31G X 5 MM MISC 1 Device by Does not apply route QID. For use with insulin pens 07/05/19  Yes Libby Maw, MD  lisinopril (ZESTRIL) 10 MG tablet Take 1 tablet (10 mg total) by mouth daily. 06/10/20  Yes Cherene Altes, MD  metoCLOPramide (REGLAN) 5 MG tablet Take 1 tablet (5 mg total) by mouth 3 (three) times daily before meals. 05/21/20  Yes Esterwood, Amy S, PA-C  promethazine (PHENERGAN) 12.5 MG tablet Take 1 tablet (12.5 mg total) by mouth every 6 (six) hours as needed for nausea or vomiting. 05/14/20  Yes Sheikh, Omair Latif, DO  Blood Glucose Monitoring Suppl (TRUE METRIX METER) w/Device KIT 1 Device by Does not apply route 4 (four) times daily. 07/05/19   Libby Maw, MD  glucose blood (TRUE METRIX BLOOD GLUCOSE TEST) test strip Use as instructed 07/05/19   Libby Maw, MD  hydrochlorothiazide (HYDRODIURIL) 25 MG tablet Take 1 tablet (25 mg total) by mouth daily. 06/14/20   [name], MD  hyoscyamine (LEVBID) 0.375 MG 12 hr tablet Take 1 tablet (0.375 mg total) by mouth every 12 (twelve) hours as needed for cramping (abdominal pain). 05/14/20   Raiford Noble Latif, DO  insulin regular (NOVOLIN R) 100 units/mL injection Inject 0-0.15 mLs (0-15 Units total) into the skin 3 (three) times daily before meals. CBG 70 - 120: 0 units CBG 121 - 150: 2 units CBG 151 - 200: 3 units CBG 201 - 250: 5 units CBG 251 - 300: 8 units CBG 301 - 350: 11 units CBG 351 - 400: 15 units 05/14/20   Sheikh, Omair Latif, DO  levalbuterol  [facility] HFA) 45 MCG/ACT inhaler Inhale 2 puffs into the lungs every 6 (six) hours as needed for wheezing or shortness of breath. 05/28/20   Little Ishikawa, MD  Multiple Vitamin (MULTIVITAMIN WITH MINERALS) TABS tablet Take 1 tablet by mouth daily.    [provider]  ondansetron (ZOFRAN ODT) 4 MG disintegrating tablet Take 1 tablet (4 mg total) by mouth every 6 (six) hours as needed for nausea or vomiting. 04/20/20   Esterwood, Amy S, PA-C  pantoprazole (PROTONIX) 40 MG tablet Take  1 tablet (40 mg total) by mouth 2 (two) times daily. 05/14/20   Raiford Noble Latif, DO  TRUEPLUS LANCETS 28G MISC assist with checking blood sugar TID and qhs 09/10/15   Zettie Pho S, PA-C  insulin aspart (NOVOLOG) 100 UNIT/ML FlexPen Inject 5 Units into the skin 3 (three) times daily with meals. 02/23/18 07/05/19  Donne Hazel, MD    Allergies    Patient has no known allergies.  Review of Systems   Review of Systems  All other systems reviewed and are negative.   Physical Exam Updated Vital Signs BP (!) 176/126   Pulse (!) 126   Temp 97.6 F (36.4 C) (Oral)   Resp 13   Ht 1.727 m (5' 8" )   Wt 70.6 kg   LMP 05/23/2020   SpO2 99%   BMI 23.65 kg/m   Physical Exam Vitals and nursing note reviewed.  Constitutional:      General: She is not in acute distress.    Appearance: She is well-developed. She is ill-appearing.  HENT:     Head: Normocephalic and atraumatic.     Right Ear: External ear normal.     Left Ear: External ear normal.  Eyes:     General: No scleral icterus.       Right eye: No discharge.        Left eye: No discharge.     Conjunctiva/sclera: Conjunctivae normal.  Neck:     Trachea: No tracheal deviation.  Cardiovascular:     Rate and Rhythm: Regular rhythm. Tachycardia present.  Pulmonary:     Effort: Pulmonary effort is normal. No respiratory distress.     Breath sounds: Normal breath sounds. No stridor. No wheezing or rales.  Abdominal:     General: Bowel  sounds are normal. There is no distension.     Palpations: Abdomen is soft.     Tenderness: There is abdominal tenderness in the epigastric area. There is no guarding or rebound.     Hernia: No hernia is present.  Musculoskeletal:        General: No tenderness.     Cervical back: Neck supple.  Skin:    General: Skin is warm and dry.     Findings: No rash.  Neurological:     Mental Status: She is alert.     Cranial Nerves: No cranial nerve deficit (no facial droop, extraocular movements intact, no slurred speech).     Sensory: No sensory deficit.     Motor: No abnormal muscle tone or seizure activity.     Coordination: Coordination normal.     ED Results / Procedures / Treatments   Labs (all labs ordered are listed, but only abnormal results are displayed) Labs Reviewed  COMPREHENSIVE METABOLIC PANEL - Abnormal; Notable for the following components:      Result Value   Sodium 133 (*)    Glucose, Bld 428 (*)    BUN 26 (*)    Creatinine, Ser 1.10 (*)    Total Protein 8.8 (*)    Total Bilirubin 1.4 (*)    All other components within normal limits  CBG MONITORING, ED - Abnormal; Notable for the following components:   Glucose-Capillary 390 (*)    All other components within normal limits  LIPASE, BLOOD  CBC  URINALYSIS, ROUTINE W REFLEX MICROSCOPIC  I-STAT BETA HCG BLOOD, ED ([facility], [facility], [facility] ONLY)    EKG None  Radiology No results found.  Procedures Procedures   Medications Ordered in ED  Medications  sodium chloride 0.9 % bolus 1,000 mL (1,000 mLs Intravenous New Bag/Given 07/23/20 1726)    And  sodium chloride 0.9 % bolus 1,000 mL (1,000 mLs Intravenous New Bag/Given 07/23/20 1728)    And  0.9 %  sodium chloride infusion (0 mLs Intravenous Hold 07/23/20 1920)  metoCLOPramide (REGLAN) injection 10 mg (10 mg Intravenous Given 07/23/20 1719)  diphenhydrAMINE (BENADRYL) injection 25 mg (25 mg Intravenous Given 07/23/20 1719)  LORazepam (ATIVAN) injection 1 mg (1 mg Intravenous  Given 07/23/20 1718)  ketorolac (TORADOL) 30 MG/ML injection 30 mg (30 mg Intravenous Given 07/23/20 1808)  haloperidol lactate (HALDOL) injection 2 mg (2 mg Intravenous Given 07/23/20 1808)  insulin aspart (novoLOG) injection 8 Units (8 Units Subcutaneous Given 07/23/20 1810)  HYDROmorphone (DILAUDID) injection 1 mg (1 mg Intravenous Given 07/23/20 1916)    ED Course  I have reviewed the triage vital signs and the nursing notes.  Pertinent labs & imaging results that were available during my care of the patient were reviewed by me and considered in my medical decision making (see chart for details).  Clinical Course as of 07/23/20 2014  Mon Jul 23, 2020  1819 Patient CBC is normal.  Metabolic panel does show hyperglycemia.  No acidosis to suggest DKA [JK]  1910 Nausea is improving but patient does remain tachycardic.  Pain is persisting despite Haldol Phenergan Ativan and Toradol. [JK]    Clinical Course User Index [JK] Dorie Rank, MD   MDM Rules/Calculators/A&P                          Patient presents to the ED for evaluation of persistent vomiting abdominal pain.  Patient has a history of diabetes and recurrent gastroparesis.  Patient feels like she is having recurrent symptoms of that.  Patient has had multiple episodes of vomiting in the ER.  She is also having persistent abdominal pain.  Patient was treated with multiple medications including Haldol, REglan, Ativan Toradol and I also ordered Dilaudid.  Patient still having persistent pain and vomiting.  Patient has not been able to take her blood pressure medications.  I will order a dose of metoprolol. Final Clinical Impression(s) / ED Diagnoses Final diagnoses:  Gastroparesis  Hypertension, unspecified type  Hyperglycemia      Dorie Rank, MD 07/23/20 2017

## 2020-07-24 ENCOUNTER — Other Ambulatory Visit: Payer: Self-pay

## 2020-07-24 ENCOUNTER — Inpatient Hospital Stay (HOSPITAL_COMMUNITY)
Admission: EM | Admit: 2020-07-24 | Discharge: 2020-07-28 | DRG: 074 | Disposition: A | Discharge status: Home / Self Care | Payer: 59 | Attending: Internal Medicine | Admitting: Internal Medicine

## 2020-07-24 ENCOUNTER — Encounter (HOSPITAL_COMMUNITY): Payer: Self-pay

## 2020-07-24 DIAGNOSIS — Z801 Family history of malignant neoplasm of trachea, bronchus and lung: Secondary | ICD-10-CM

## 2020-07-24 DIAGNOSIS — Z79899 Other long term (current) drug therapy: Secondary | ICD-10-CM

## 2020-07-24 DIAGNOSIS — E0590 Thyrotoxicosis, unspecified without thyrotoxic crisis or storm: Secondary | ICD-10-CM | POA: Diagnosis present

## 2020-07-24 DIAGNOSIS — E1043 Type 1 diabetes mellitus with diabetic autonomic (poly)neuropathy: Principal | ICD-10-CM | POA: Diagnosis present

## 2020-07-24 DIAGNOSIS — D649 Anemia, unspecified: Secondary | ICD-10-CM | POA: Diagnosis present

## 2020-07-24 DIAGNOSIS — Z833 Family history of diabetes mellitus: Secondary | ICD-10-CM

## 2020-07-24 DIAGNOSIS — Z818 Family history of other mental and behavioral disorders: Secondary | ICD-10-CM

## 2020-07-24 DIAGNOSIS — E1065 Type 1 diabetes mellitus with hyperglycemia: Secondary | ICD-10-CM | POA: Diagnosis present

## 2020-07-24 DIAGNOSIS — K3184 Gastroparesis: Secondary | ICD-10-CM | POA: Diagnosis not present

## 2020-07-24 DIAGNOSIS — Z800 Family history of malignant neoplasm of digestive organs: Secondary | ICD-10-CM

## 2020-07-24 DIAGNOSIS — Z8041 Family history of malignant neoplasm of ovary: Secondary | ICD-10-CM

## 2020-07-24 DIAGNOSIS — N179 Acute kidney failure, unspecified: Secondary | ICD-10-CM | POA: Diagnosis present

## 2020-07-24 DIAGNOSIS — R630 Anorexia: Secondary | ICD-10-CM | POA: Diagnosis present

## 2020-07-24 DIAGNOSIS — N1832 Chronic kidney disease, stage 3b: Secondary | ICD-10-CM | POA: Diagnosis present

## 2020-07-24 DIAGNOSIS — E1143 Type 2 diabetes mellitus with diabetic autonomic (poly)neuropathy: Secondary | ICD-10-CM | POA: Diagnosis not present

## 2020-07-24 DIAGNOSIS — F32A Depression, unspecified: Secondary | ICD-10-CM | POA: Diagnosis present

## 2020-07-24 DIAGNOSIS — E860 Dehydration: Secondary | ICD-10-CM | POA: Diagnosis present

## 2020-07-24 DIAGNOSIS — G8929 Other chronic pain: Secondary | ICD-10-CM | POA: Diagnosis present

## 2020-07-24 DIAGNOSIS — Z794 Long term (current) use of insulin: Secondary | ICD-10-CM

## 2020-07-24 DIAGNOSIS — Z89422 Acquired absence of other left toe(s): Secondary | ICD-10-CM

## 2020-07-24 DIAGNOSIS — Z803 Family history of malignant neoplasm of breast: Secondary | ICD-10-CM

## 2020-07-24 DIAGNOSIS — I10 Essential (primary) hypertension: Secondary | ICD-10-CM | POA: Diagnosis present

## 2020-07-24 DIAGNOSIS — K219 Gastro-esophageal reflux disease without esophagitis: Secondary | ICD-10-CM | POA: Diagnosis present

## 2020-07-24 DIAGNOSIS — R17 Unspecified jaundice: Secondary | ICD-10-CM | POA: Diagnosis present

## 2020-07-24 DIAGNOSIS — F419 Anxiety disorder, unspecified: Secondary | ICD-10-CM | POA: Diagnosis present

## 2020-07-24 DIAGNOSIS — Z8616 Personal history of COVID-19: Secondary | ICD-10-CM

## 2020-07-24 LAB — I-STAT BETA HCG BLOOD, ED (MC, WL, AP ONLY): I-stat hCG, quantitative: <5 m[IU]/mL (ref <5)

## 2020-07-24 LAB — COMPREHENSIVE METABOLIC PANEL
ALT: 27 U/L (ref 0–44)
AST: 34 U/L (ref 15–41)
Albumin: 3.9 g/dL (ref 3.5–5.0)
Alkaline Phosphatase: 75 U/L (ref 38–126)
Anion gap: 14 (ref 5–15)
BUN: 28 mg/dL — ABNORMAL HIGH (ref 6–20)
CO2: 20 mmol/L — ABNORMAL LOW (ref 22–32)
Calcium: 9.8 mg/dL (ref 8.9–10.3)
Chloride: 100 mmol/L (ref 98–111)
Creatinine, Ser: 1.32 mg/dL — ABNORMAL HIGH (ref 0.44–1.00)
GFR, Estimated: 56 mL/min — ABNORMAL LOW (ref >60)
Glucose, Bld: 398 mg/dL — ABNORMAL HIGH (ref 70–99)
Potassium: 3.5 mmol/L (ref 3.5–5.1)
Sodium: 134 mmol/L — ABNORMAL LOW (ref 135–145)
Total Bilirubin: 1.5 mg/dL — ABNORMAL HIGH (ref 0.3–1.2)
Total Protein: 7.9 g/dL (ref 6.5–8.1)

## 2020-07-24 LAB — CBC WITH DIFFERENTIAL/PLATELET
Abs Immature Granulocytes: 0.04 10*3/uL (ref 0.00–0.07)
Basophils Absolute: 0 10*3/uL (ref 0.0–0.1)
Basophils Relative: 0 %
Eosinophils Absolute: 0 10*3/uL (ref 0.0–0.5)
Eosinophils Relative: 0 %
HCT: 35 % — ABNORMAL LOW (ref 36.0–46.0)
Hemoglobin: 11.8 g/dL — ABNORMAL LOW (ref 12.0–15.0)
Immature Granulocytes: 0 %
Lymphocytes Relative: 13 %
Lymphs Abs: 1.9 10*3/uL (ref 0.7–4.0)
MCH: 30 pg (ref 26.0–34.0)
MCHC: 33.7 g/dL (ref 30.0–36.0)
MCV: 89.1 fL (ref 80.0–100.0)
Monocytes Absolute: 0.6 10*3/uL (ref 0.1–1.0)
Monocytes Relative: 4 %
Neutro Abs: 12.1 10*3/uL — ABNORMAL HIGH (ref 1.7–7.7)
Neutrophils Relative %: 83 %
Platelets: 345 10*3/uL (ref 150–400)
RBC: 3.93 MIL/uL (ref 3.87–5.11)
RDW: 12.7 % (ref 11.5–15.5)
WBC: 14.6 10*3/uL — ABNORMAL HIGH (ref 4.0–10.5)
nRBC: 0 % (ref 0.0–0.2)

## 2020-07-24 LAB — BASIC METABOLIC PANEL
Anion gap: 14 (ref 5–15)
BUN: 23 mg/dL — ABNORMAL HIGH (ref 6–20)
CO2: 20 mmol/L — ABNORMAL LOW (ref 22–32)
Calcium: 9.7 mg/dL (ref 8.9–10.3)
Chloride: 102 mmol/L (ref 98–111)
Creatinine, Ser: 0.88 mg/dL (ref 0.44–1.00)
GFR, Estimated: >60 mL/min (ref >60)
Glucose, Bld: 260 mg/dL — ABNORMAL HIGH (ref 70–99)
Potassium: 3.9 mmol/L (ref 3.5–5.1)
Sodium: 136 mmol/L (ref 135–145)

## 2020-07-24 LAB — LIPASE, BLOOD: Lipase: 25 U/L (ref 11–51)

## 2020-07-24 LAB — RESP PANEL BY RT-PCR (FLU A&B, COVID) ARPGX2
Influenza A by PCR: NEGATIVE
Influenza B by PCR: NEGATIVE
SARS Coronavirus 2 by RT PCR: POSITIVE — AB

## 2020-07-24 LAB — CBG MONITORING, ED
Glucose-Capillary: 230 mg/dL — ABNORMAL HIGH (ref 70–99)
Glucose-Capillary: 218 mg/dL — ABNORMAL HIGH (ref 70–99)

## 2020-07-24 MED ORDER — PANTOPRAZOLE SODIUM 40 MG PO TBEC: 40.0000 mg | DELAYED_RELEASE_TABLET | Freq: Every day | ORAL | 1 refills | Status: DC

## 2020-07-24 MED ORDER — HYDROMORPHONE HCL 1 MG/ML IJ SOLN
1.0000 mg | Freq: Once | INTRAMUSCULAR | Status: AC
  Administered 2020-07-24: 1 mg via INTRAVENOUS
  Filled 2020-07-24: qty 1

## 2020-07-24 MED ORDER — ONDANSETRON HCL 4 MG/2ML IJ SOLN
4.0000 mg | Freq: Once | INTRAMUSCULAR | Status: AC
  Administered 2020-07-24: 4 mg via INTRAVENOUS
  Filled 2020-07-24: qty 2

## 2020-07-24 MED ORDER — INSULIN GLARGINE 100 UNIT/ML ~~LOC~~ SOLN
9.0000 [IU] | Freq: Every day | SUBCUTANEOUS | Status: DC
  Filled 2020-07-24: qty 0.09

## 2020-07-24 NOTE — ED Notes (Signed)
Pillow provided per pt request.  Pt resting quietly, NAD noted.

## 2020-07-24 NOTE — ED Provider Notes (Signed)
Emergency Medicine Provider Triage Evaluation Note  Kelli Hudson 30 y.o. female was evaluated in triage.  Pt complains of nausea/vomiting, abdominal pain that is been ongoing all day.  Patient reports she has not been able to keep anything down.  History of diabetes and states she is still been taking insulin.  She denies any chest pain, difficulty breathing.   Review of Systems  Positive: Abdominal pain, nausea/vomiting. Negative: Chest pain, difficulty breathing.  Physical Exam  BP 134/82   Pulse 70   Temp 98.2 F (36.8 C) (Oral)   Resp 18   Ht 5\' 4"  (1.626 m)   Wt 65.8 kg   SpO2 100%   BMI 24.89 kg/m  Gen:   Awake, appears uncomfortable but NAD HEENT:  Atraumatic  Resp:  Normal effort  Cardiac:  Normal rate  Abd:   Nondistended, diffuse tenderness noted.  No rigidity, guarding. MSK:   Moves extremities without difficulty  Neuro:  Speech clear   Other:      Medical Decision Making  Medically screening exam initiated at 8:04 PM  Appropriate orders placed.  Kelli Hudson was informed that the remainder of the evaluation will be completed by another provider, this initial triage assessment does not replace that evaluation. They are counseled that they will need to remain in the ED until the completion of their workup, including full H&P and results of any tests.  Risks of leaving the emergency department prior to completion of treatment were discussed. Patient was advised to inform ED staff if they are leaving before their treatment is complete. The patient acknowledged these risks and time was allowed for questions.     The patient appears stable so that the remainder of the MSE may be completed by another provider.    Clinical Impression  abd pain, n/v   Portions of this note were generated with Dragon dictation software. Dictation errors may occur despite best attempts at proofreading.      Kelli Reichmann, PA-C 07/24/20 09/23/20,  MD 07/24/20 [phone]

## 2020-07-24 NOTE — ED Notes (Signed)
Kelli Blount,NP aware pt is covid + and aware of pt's tachycardia. Pt resting in bed with eyes closed, RR e/u, NAD noted.

## 2020-07-24 NOTE — ED Notes (Signed)
Pt states the mom is her legal guardian but unsure as to why. Registation states they do not have any paperwork showing she is or HCPOA. Mom is requesting updates on patient.

## 2020-07-24 NOTE — ED Notes (Signed)
Patient is resting comfortably. 

## 2020-07-24 NOTE — ED Triage Notes (Signed)
Pt c/o n/v and generalized abd pain starting last night and getting worse today. Denies fevers

## 2020-07-24 NOTE — ED Notes (Signed)
Pt given meal tray.

## 2020-07-25 ENCOUNTER — Telehealth: Payer: Self-pay

## 2020-07-25 ENCOUNTER — Telehealth: Payer: 59

## 2020-07-25 DIAGNOSIS — F32A Depression, unspecified: Secondary | ICD-10-CM | POA: Diagnosis present

## 2020-07-25 DIAGNOSIS — R630 Anorexia: Secondary | ICD-10-CM | POA: Diagnosis present

## 2020-07-25 DIAGNOSIS — R112 Nausea with vomiting, unspecified: Secondary | ICD-10-CM | POA: Diagnosis present

## 2020-07-25 DIAGNOSIS — G8929 Other chronic pain: Secondary | ICD-10-CM | POA: Diagnosis present

## 2020-07-25 DIAGNOSIS — Z818 Family history of other mental and behavioral disorders: Secondary | ICD-10-CM | POA: Diagnosis not present

## 2020-07-25 DIAGNOSIS — E0590 Thyrotoxicosis, unspecified without thyrotoxic crisis or storm: Secondary | ICD-10-CM | POA: Diagnosis present

## 2020-07-25 DIAGNOSIS — Z833 Family history of diabetes mellitus: Secondary | ICD-10-CM | POA: Diagnosis not present

## 2020-07-25 DIAGNOSIS — F419 Anxiety disorder, unspecified: Secondary | ICD-10-CM | POA: Diagnosis present

## 2020-07-25 DIAGNOSIS — E1065 Type 1 diabetes mellitus with hyperglycemia: Secondary | ICD-10-CM | POA: Diagnosis present

## 2020-07-25 DIAGNOSIS — Z8041 Family history of malignant neoplasm of ovary: Secondary | ICD-10-CM | POA: Diagnosis not present

## 2020-07-25 DIAGNOSIS — I10 Essential (primary) hypertension: Secondary | ICD-10-CM | POA: Diagnosis present

## 2020-07-25 DIAGNOSIS — E1043 Type 1 diabetes mellitus with diabetic autonomic (poly)neuropathy: Secondary | ICD-10-CM | POA: Diagnosis present

## 2020-07-25 DIAGNOSIS — Z801 Family history of malignant neoplasm of trachea, bronchus and lung: Secondary | ICD-10-CM | POA: Diagnosis not present

## 2020-07-25 DIAGNOSIS — K3184 Gastroparesis: Secondary | ICD-10-CM | POA: Diagnosis present

## 2020-07-25 DIAGNOSIS — Z8616 Personal history of COVID-19: Secondary | ICD-10-CM | POA: Diagnosis not present

## 2020-07-25 DIAGNOSIS — K219 Gastro-esophageal reflux disease without esophagitis: Secondary | ICD-10-CM | POA: Diagnosis present

## 2020-07-25 DIAGNOSIS — N179 Acute kidney failure, unspecified: Secondary | ICD-10-CM | POA: Diagnosis present

## 2020-07-25 DIAGNOSIS — Z89422 Acquired absence of other left toe(s): Secondary | ICD-10-CM | POA: Diagnosis not present

## 2020-07-25 DIAGNOSIS — Z800 Family history of malignant neoplasm of digestive organs: Secondary | ICD-10-CM | POA: Diagnosis not present

## 2020-07-25 DIAGNOSIS — E860 Dehydration: Secondary | ICD-10-CM | POA: Diagnosis present

## 2020-07-25 DIAGNOSIS — Z794 Long term (current) use of insulin: Secondary | ICD-10-CM | POA: Diagnosis not present

## 2020-07-25 DIAGNOSIS — N1832 Chronic kidney disease, stage 3b: Secondary | ICD-10-CM | POA: Diagnosis present

## 2020-07-25 DIAGNOSIS — Z803 Family history of malignant neoplasm of breast: Secondary | ICD-10-CM | POA: Diagnosis not present

## 2020-07-25 DIAGNOSIS — Z79899 Other long term (current) drug therapy: Secondary | ICD-10-CM | POA: Diagnosis not present

## 2020-07-25 DIAGNOSIS — R17 Unspecified jaundice: Secondary | ICD-10-CM | POA: Diagnosis present

## 2020-07-25 DIAGNOSIS — D649 Anemia, unspecified: Secondary | ICD-10-CM | POA: Diagnosis present

## 2020-07-25 LAB — COMPREHENSIVE METABOLIC PANEL
ALT: 24 U/L (ref 0–44)
AST: 30 U/L (ref 15–41)
Albumin: 3.2 g/dL — ABNORMAL LOW (ref 3.5–5.0)
Alkaline Phosphatase: 62 U/L (ref 38–126)
Anion gap: 10 (ref 5–15)
BUN: 18 mg/dL (ref 6–20)
CO2: 20 mmol/L — ABNORMAL LOW (ref 22–32)
Calcium: 8.6 mg/dL — ABNORMAL LOW (ref 8.9–10.3)
Chloride: 105 mmol/L (ref 98–111)
Creatinine, Ser: 0.79 mg/dL (ref 0.44–1.00)
GFR, Estimated: >60 mL/min (ref >60)
Glucose, Bld: 219 mg/dL — ABNORMAL HIGH (ref 70–99)
Potassium: 4 mmol/L (ref 3.5–5.1)
Sodium: 135 mmol/L (ref 135–145)
Total Bilirubin: 1.4 mg/dL — ABNORMAL HIGH (ref 0.3–1.2)
Total Protein: 6.6 g/dL (ref 6.5–8.1)

## 2020-07-25 LAB — CBC WITH DIFFERENTIAL/PLATELET
Abs Immature Granulocytes: 0.05 10*3/uL (ref 0.00–0.07)
Basophils Absolute: 0 10*3/uL (ref 0.0–0.1)
Basophils Relative: 0 %
Eosinophils Absolute: 0 10*3/uL (ref 0.0–0.5)
Eosinophils Relative: 0 %
HCT: 31 % — ABNORMAL LOW (ref 36.0–46.0)
Hemoglobin: 10.4 g/dL — ABNORMAL LOW (ref 12.0–15.0)
Immature Granulocytes: 0 %
Lymphocytes Relative: 15 %
Lymphs Abs: 2.2 10*3/uL (ref 0.7–4.0)
MCH: 29.4 pg (ref 26.0–34.0)
MCHC: 33.5 g/dL (ref 30.0–36.0)
MCV: 87.6 fL (ref 80.0–100.0)
Monocytes Absolute: 1.1 10*3/uL — ABNORMAL HIGH (ref 0.1–1.0)
Monocytes Relative: 8 %
Neutro Abs: 11.5 10*3/uL — ABNORMAL HIGH (ref 1.7–7.7)
Neutrophils Relative %: 77 %
Platelets: 304 10*3/uL (ref 150–400)
RBC: 3.54 MIL/uL — ABNORMAL LOW (ref 3.87–5.11)
RDW: 12.8 % (ref 11.5–15.5)
WBC: 14.9 10*3/uL — ABNORMAL HIGH (ref 4.0–10.5)
nRBC: 0 % (ref 0.0–0.2)

## 2020-07-25 LAB — URINALYSIS, ROUTINE W REFLEX MICROSCOPIC
Bacteria, UA: NONE SEEN
Bilirubin Urine: NEGATIVE
Glucose, UA: >=500 mg/dL — AB
Ketones, ur: 80 mg/dL — AB
Leukocytes,Ua: NEGATIVE
Nitrite: NEGATIVE
Protein, ur: 100 mg/dL — AB
Specific Gravity, Urine: 1.022 (ref 1.005–1.030)
pH: 5 (ref 5.0–8.0)

## 2020-07-25 LAB — CBG MONITORING, ED
Glucose-Capillary: 380 mg/dL — ABNORMAL HIGH (ref 70–99)
Glucose-Capillary: 282 mg/dL — ABNORMAL HIGH (ref 70–99)
Glucose-Capillary: 207 mg/dL — ABNORMAL HIGH (ref 70–99)
Glucose-Capillary: 225 mg/dL — ABNORMAL HIGH (ref 70–99)
Glucose-Capillary: 258 mg/dL — ABNORMAL HIGH (ref 70–99)

## 2020-07-25 LAB — MAGNESIUM: Magnesium: 1.6 mg/dL — ABNORMAL LOW (ref 1.7–2.4)

## 2020-07-25 LAB — GLUCOSE, CAPILLARY: Glucose-Capillary: 178 mg/dL — ABNORMAL HIGH (ref 70–99)

## 2020-07-25 MED ORDER — METOCLOPRAMIDE HCL 5 MG/ML IJ SOLN
10.0000 mg | Freq: Three times a day (TID) | INTRAMUSCULAR | Status: DC
  Administered 2020-07-25 – 2020-07-28 (×10): 10 mg via INTRAVENOUS
  Filled 2020-07-25 (×10): qty 2

## 2020-07-25 MED ORDER — INSULIN GLARGINE 100 UNIT/ML ~~LOC~~ SOLN: 15.0000 [IU] | Freq: Every day | SUBCUTANEOUS | Status: DC

## 2020-07-25 MED ORDER — INSULIN GLARGINE 100 UNIT/ML ~~LOC~~ SOLN
15.0000 [IU] | Freq: Every day | SUBCUTANEOUS | Status: DC
  Administered 2020-07-25 – 2020-07-28 (×4): 15 [IU] via SUBCUTANEOUS
  Filled 2020-07-25 (×4): qty 0.15

## 2020-07-25 MED ORDER — MAGNESIUM SULFATE 2 GM/50ML IV SOLN
2.0000 g | Freq: Once | INTRAVENOUS | Status: AC
  Administered 2020-07-25: 2 g via INTRAVENOUS
  Filled 2020-07-25: qty 50

## 2020-07-25 MED ORDER — PROMETHAZINE HCL 25 MG RE SUPP
25.0000 mg | Freq: Four times a day (QID) | RECTAL | Status: DC | PRN
  Administered 2020-07-25: 25 mg via RECTAL
  Filled 2020-07-25 (×2): qty 1

## 2020-07-25 MED ORDER — INSULIN ASPART 100 UNIT/ML IJ SOLN
8.0000 [IU] | Freq: Once | INTRAMUSCULAR | Status: AC
  Administered 2020-07-25: 8 [IU] via INTRAVENOUS
  Filled 2020-07-25: qty 0.08

## 2020-07-25 MED ORDER — DRONABINOL 2.5 MG PO CAPS
5.0000 mg | ORAL_CAPSULE | Freq: Two times a day (BID) | ORAL | Status: DC
  Administered 2020-07-25 – 2020-07-27 (×5): 5 mg via ORAL
  Filled 2020-07-25 (×2): qty 2
  Filled 2020-07-25: qty 1
  Filled 2020-07-25 (×2): qty 2

## 2020-07-25 MED ORDER — ENOXAPARIN SODIUM 40 MG/0.4ML IJ SOSY
40.0000 mg | PREFILLED_SYRINGE | INTRAMUSCULAR | Status: DC
  Administered 2020-07-25 – 2020-07-27 (×3): 40 mg via SUBCUTANEOUS
  Filled 2020-07-25 (×3): qty 0.4

## 2020-07-25 MED ORDER — SODIUM CHLORIDE 0.9 % IV SOLN
250.0000 mg | Freq: Once | INTRAVENOUS | Status: AC
  Administered 2020-07-25: 250 mg via INTRAVENOUS
  Filled 2020-07-25 (×2): qty 5

## 2020-07-25 MED ORDER — METOCLOPRAMIDE HCL 5 MG/ML IJ SOLN
10.0000 mg | Freq: Once | INTRAMUSCULAR | Status: AC
  Administered 2020-07-25: 10 mg via INTRAVENOUS
  Filled 2020-07-25: qty 2

## 2020-07-25 MED ORDER — ERYTHROMYCIN BASE 250 MG PO TABS
250.0000 mg | ORAL_TABLET | Freq: Every day | ORAL | Status: DC
  Administered 2020-07-26 – 2020-07-28 (×3): 250 mg via ORAL
  Filled 2020-07-25 (×3): qty 1

## 2020-07-25 MED ORDER — HYDROMORPHONE HCL 1 MG/ML IJ SOLN
1.0000 mg | INTRAMUSCULAR | Status: DC | PRN
  Administered 2020-07-25 – 2020-07-27 (×7): 1 mg via INTRAVENOUS
  Filled 2020-07-25 (×7): qty 1

## 2020-07-25 MED ORDER — SODIUM CHLORIDE 0.9 % IV SOLN: INTRAVENOUS | Status: DC

## 2020-07-25 MED ORDER — HYOSCYAMINE SULFATE ER 0.375 MG PO TB12
0.3750 mg | ORAL_TABLET | Freq: Two times a day (BID) | ORAL | Status: DC | PRN
  Administered 2020-07-25: 0.375 mg via ORAL
  Filled 2020-07-25 (×2): qty 1

## 2020-07-25 MED ORDER — PANTOPRAZOLE SODIUM 40 MG IV SOLR
40.0000 mg | INTRAVENOUS | Status: DC
  Administered 2020-07-26 – 2020-07-28 (×3): 40 mg via INTRAVENOUS
  Filled 2020-07-25 (×3): qty 40

## 2020-07-25 MED ORDER — SODIUM CHLORIDE 0.9 % IV BOLUS
1000.0000 mL | Freq: Once | INTRAVENOUS | Status: AC
  Administered 2020-07-25: 1000 mL via INTRAVENOUS

## 2020-07-25 MED ORDER — PANTOPRAZOLE SODIUM 40 MG IV SOLR
40.0000 mg | Freq: Once | INTRAVENOUS | Status: AC
  Administered 2020-07-25: 40 mg via INTRAVENOUS
  Filled 2020-07-25: qty 40

## 2020-07-25 MED ORDER — FAMOTIDINE IN NACL 20-0.9 MG/50ML-% IV SOLN
20.0000 mg | Freq: Once | INTRAVENOUS | Status: AC
  Administered 2020-07-25: 20 mg via INTRAVENOUS
  Filled 2020-07-25: qty 50

## 2020-07-25 MED ORDER — HYDROMORPHONE HCL 1 MG/ML IJ SOLN
1.0000 mg | Freq: Once | INTRAMUSCULAR | Status: AC
Start: 2020-07-25 — End: 2020-07-25
  Administered 2020-07-25: 1 mg via INTRAVENOUS
  Filled 2020-07-25: qty 1

## 2020-07-25 MED ORDER — SODIUM CHLORIDE 0.9 % IV SOLN
12.5000 mg | Freq: Once | INTRAVENOUS | Status: AC
  Administered 2020-07-25: 12.5 mg via INTRAVENOUS
  Filled 2020-07-25: qty 12.5

## 2020-07-25 MED ORDER — INSULIN ASPART 100 UNIT/ML IJ SOLN
0.0000 [IU] | Freq: Three times a day (TID) | INTRAMUSCULAR | Status: DC
  Administered 2020-07-25: 5 [IU] via SUBCUTANEOUS
  Administered 2020-07-25: 8 [IU] via SUBCUTANEOUS
  Administered 2020-07-26: 5 [IU] via SUBCUTANEOUS
  Administered 2020-07-26: 3 [IU] via SUBCUTANEOUS
  Administered 2020-07-26: 2 [IU] via SUBCUTANEOUS
  Administered 2020-07-27: 3 [IU] via SUBCUTANEOUS
  Administered 2020-07-27: 5 [IU] via SUBCUTANEOUS
  Administered 2020-07-28: 8 [IU] via SUBCUTANEOUS
  Filled 2020-07-25: qty 0.15

## 2020-07-25 MED ORDER — METOPROLOL TARTRATE 5 MG/5ML IV SOLN
5.0000 mg | Freq: Four times a day (QID) | INTRAVENOUS | Status: DC | PRN
  Administered 2020-07-25: 5 mg via INTRAVENOUS
  Filled 2020-07-25: qty 5

## 2020-07-25 MED ORDER — ATENOLOL 25 MG PO TABS
25.0000 mg | ORAL_TABLET | Freq: Two times a day (BID) | ORAL | Status: DC
  Administered 2020-07-25 – 2020-07-28 (×6): 25 mg via ORAL
  Filled 2020-07-25 (×6): qty 1

## 2020-07-25 MED ORDER — LEVALBUTEROL TARTRATE 45 MCG/ACT IN AERO: 2.0000 | INHALATION_SPRAY | Freq: Four times a day (QID) | RESPIRATORY_TRACT | Status: DC | PRN

## 2020-07-25 NOTE — Progress Notes (Signed)
   07/25/20 2222  Assess: MEWS Score  Temp 98.3 F (36.8 C)  BP 107/69  Pulse Rate (!) 113  Resp 16  SpO2 98 %  O2 Device Room Air  Assess: MEWS Score  MEWS Temp 0  MEWS Systolic 0  MEWS Pulse 2  MEWS RR 0  MEWS LOC 0  MEWS Score 2  MEWS Score Color Yellow  Assess: if the MEWS score is Yellow or Red  Were vital signs taken at a resting state? Yes  Focused Assessment No change from prior assessment  Does the patient meet 2 or more of the SIRS criteria? No  Does the patient have a confirmed or suspected source of infection? No  MEWS guidelines implemented *See Row Information* No, previously yellow, continue vital signs every 4 hours  Treat  MEWS Interventions Administered prn meds/treatments  Document  Patient Outcome Other (Comment) (remain on unit)  Progress note created (see row info) Yes  Assess: SIRS CRITERIA  SIRS Temperature  0  SIRS Pulse 1  SIRS Respirations  0  SIRS WBC 0  SIRS Score Sum  1  Patient remain on the unit. Now have IV access and being administered IFV and PRN meds. No distress noted. Will continue to monitor the patient.

## 2020-07-25 NOTE — Progress Notes (Addendum)
Patient presented to the unit from the ED. Patient was in the Yellow MEWS in the ED and once on the unit continues to be in the yellow MEWS. Patient is A&Ox4, complains of pain but lost IV access upon arrival to room 1512. IV access attempted x2 by Whitney Muse, RN. Both attempts were unsuccessful. Will place IV team consult to come place IV. Patient complains of pain 10/10 and N/V. Notified charge nurse Annice Pih, RN. AMION page sent to Dr. Ronaldo Miyamoto. Will continue to monitor patient.   07/25/20 1813  Assess: MEWS Score  Temp 99 F (37.2 C)  BP (!) 150/102  Pulse Rate (!) 121  SpO2 100 %  Assess: MEWS Score  MEWS Temp 0  MEWS Systolic 0  MEWS Pulse 2  MEWS RR 0  MEWS LOC 0  MEWS Score 2  MEWS Score Color Yellow  Assess: if the MEWS score is Yellow or Red  Were vital signs taken at a resting state? Yes  Focused Assessment No change from prior assessment  Does the patient meet 2 or more of the SIRS criteria? No  Does the patient have a confirmed or suspected source of infection? No  MEWS guidelines implemented *See Row Information* Yes  Take Vital Signs  Increase Vital Sign Frequency  Yellow: Q 2hr X 2 then Q 4hr X 2, if remains yellow, continue Q 4hrs  Escalate  MEWS: Escalate Yellow: discuss with charge nurse/RN and consider discussing with provider and RRT  Notify: Charge Nurse/RN  Name of Charge Nurse/RN Notified Engineer, manufacturing systems  Date Charge Nurse/RN Notified 07/25/20  Time Charge Nurse/RN Notified 1842  Document  Patient Outcome Other (Comment) (Patient remains on the unit)  Progress note created (see row info) Yes  Assess: SIRS CRITERIA  SIRS Temperature  0  SIRS Pulse 1  SIRS Respirations  0  SIRS WBC 0  SIRS Score Sum  1

## 2020-07-25 NOTE — Telephone Encounter (Signed)
  Care Management   Follow Up Note   07/25/2020 Name: Kelli Hudson MRN: [id number] DOB: 08/25/1990   Referred by: Loyola Mast, MD Reason for referral : Care Coordination (DM, Gastroparesis)   Successful contact made with patient. Patient reports she is in the hospital.   Follow Up Plan: The care management team will reach out to the patient again over the next 2 weeks.   George Ina RN,BSN,CCM RN Case Manager [facility] [place] [phone]

## 2020-07-25 NOTE — Progress Notes (Addendum)
Delay in accepting patient due to untreated  BP: 171/118 and HR: 127. Patient has PRN metoprolol to treat elevated BP. Requested that patient receive PRN medication and BP be rechecked 15-30 minutes after medication is administered before patient is accepted to unit.

## 2020-07-25 NOTE — Progress Notes (Signed)
   07/25/20 1956  Assess: MEWS Score  Temp 98.4 F (36.9 C)  BP (!) 152/104  Pulse Rate (!) 118  Resp 18  Level of Consciousness Alert  SpO2 100 %  O2 Device Room Air  Patient Activity (if Appropriate) In bed  Assess: if the MEWS score is Yellow or Red  Were vital signs taken at a resting state? Yes  Focused Assessment No change from prior assessment  Does the patient have a confirmed or suspected source of infection? No  MEWS guidelines implemented *See Row Information* Yes (next VS are in 2 hours)  Treat  MEWS Interventions Other (Comment) (waiting for IV team to start IV)  Pain Scale 0-10  Pain Score 10  Pain Type Acute pain  Pain Location Epigastric  Pain Orientation Right;Left  Pain Descriptors / Indicators Burning;Throbbing  Pain Frequency Constant  Pain Onset On-going  Patients Stated Pain Goal 0  Pain Intervention(s) Repositioned;Relaxation;Rest (waiting for IV access)  Multiple Pain Sites No  Complains of Nausea /  Vomiting  Nausea relieved by Other (Comment) (previous antiemetic is effective)  Patients response to intervention Decreased  Take Vital Signs  Increase Vital Sign Frequency  Yellow: Q 2hr X 2 then Q 4hr X 2, if remains yellow, continue Q 4hrs  Escalate  MEWS: Escalate Yellow: discuss with charge nurse/RN and consider discussing with provider and RRT  Notify: Charge Nurse/RN  Name of Charge Nurse/RN Notified Tom RN  Date Charge Nurse/RN Notified 07/25/20  Time Charge Nurse/RN Notified 2000  Notify: Provider  Provider Name/Title  (MD notified by nurse at shift change)  Date Provider Notified 07/25/20  Provider response Other (Comment) (start IV fluids and meds when new IV is started)  Document  Patient Outcome Other (Comment) (patient remain on unit and awaiting IV access)  Progress note created (see row info) Yes  Assess: SIRS CRITERIA  SIRS Temperature  0  SIRS Pulse 1  SIRS Respirations  0  SIRS WBC 0  SIRS Score Sum  1  Patient remain  in yellow MEWS and awaiting for new IV access. MD was notified at shift change by off-going nurse. Start IV fluids and meds when new site is accessed. Patient is alert and able to make needs known. No distress noted. Will continue to monitor the patient.

## 2020-07-25 NOTE — Progress Notes (Signed)
Inpatient Diabetes Program Recommendations  AACE/ADA: New Consensus Statement on Inpatient Glycemic Control (2015)  Target Ranges:  Prepandial:   less than 140 mg/dL      Peak postprandial:   less than 180 mg/dL (1-2 hours)      Critically ill patients:  140 - 180 mg/dL   Lab Results  Component Value Date   GLUCAP 207 (H) 07/25/2020   HGBA1C 10.2 (H) 06/27/2020    Review of Glycemic Control Results for Kelli Hudson, Kelli Hudson (MRN [id number]) as of 07/25/2020 10:25  Ref. Range 07/24/2020 04:33 07/24/2020 09:13 07/25/2020 02:50 07/25/2020 05:03 07/25/2020 07:55  Glucose-Capillary Latest Ref Range: 70 - 99 mg/dL 315 (H) 176 (H) 160 (H) 282 (H) 207 (H)   Diabetes history: DM1 Outpatient Diabetes medications: Tresiba 18 units (last dose listed as 07/23/20) + Novolin R tid meal coverage on sliding scale Current orders for Inpatient glycemic control: Lanttus 18 units q hs starting tonight + Novolog correction 0-15 units tid with meals  Inpatient Diabetes Program Recommendations:   Patient may need to receive Lantus this am instead of starting hs due to last dose recorded as 07/23/20 per chart. -Consider Decrease in Lantus to 80% home dose = 14 -Decrease Novolog correction to 0-9 units q 4 hrs while liquid diet, then tid + hs 0-5 units -Add Novolog 3 units tid meal coverage meal coverage when eating 50% meals  Thank you, Darel Hong E. Jenah Vanasten, RN, MSN, CDE  Diabetes Coordinator Inpatient Glycemic Control Team Team Pager [phone] (8am-5pm) 07/25/2020 10:36 AM

## 2020-07-25 NOTE — ED Provider Notes (Addendum)
[facility] DEPT Provider Note   CSN: [id number] Arrival date & time: 07/24/20  1942     History Chief Complaint  Patient presents with  . Emesis    Kelli Hudson is a 30 y.o. female.  Patient with history of poorly controlled T1DM, gastroparesis (gastric emptying study 03/22), HTN, presents with recurrent abdominal pain, nausea and vomiting several hours after discharge from observation admission for same. No fever. No hematemesis.   The history is provided by the patient and a parent. No language interpreter was used.       Past Medical History:  Diagnosis Date  . Acute H. pylori gastric ulcer   . Diabetes mellitus ([place])   . Diabetic gastroparesis ([place])   . Gastroparesis   . GERD (gastroesophageal reflux disease)   . Hypertension   . Hyperthyroidism     Patient Active Problem List   Diagnosis Date Noted  . Abdominal pain 07/23/2020  . GERD (gastroesophageal reflux disease) 07/23/2020  . Diabetic nephropathy ([place]) 06/27/2020  . Sepsis ([place]) 05/27/2020  . Borderline hyperlipidemia 05/23/2020  . Gastroparesis due to DM ([place]) 05/11/2020  . 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 Tachycardia 05/09/2020  . Normocytic anemia 04/20/2020  . Hyperosmolar hyperglycemic state (HHS) ([place]) 02/24/2020  . Intractable nausea and vomiting   . Intractable abdominal pain 01/24/2020  . H. pylori infection 10/07/2019  . Depression with anxiety 07/05/2019  . Diabetic foot ulcer ([place]) 02/21/2018  . Diabetic polyneuropathy associated with type 2 diabetes mellitus ([place]) 09/24/2017  . Pain in right foot 09/24/2017  . Gastroparesis   . Leukocytosis   . Increased frequency of urination 06/14/2011  . UTI (urinary tract infection) 06/14/2011  . Goiter 10/05/2009  . Uncontrolled diabetes mellitus ([place]) 10/05/2009  . Essential hypertension 10/05/2009    Past Surgical History:  Procedure Laterality Date  .  AMPUTATION TOE Left 03/10/2018   Procedure: AMPUTATION FIFTH TOE;  Surgeon: Trula Slade, DPM;  Location: [facility];  Service: Podiatry;  Laterality: Left;  . BIOPSY  01/28/2020   Procedure: BIOPSY;  Surgeon: Otis Brace, MD;  Location: [facility];  Service: Gastroenterology;;  . ESOPHAGOGASTRODUODENOSCOPY N/A 01/28/2020   Procedure: ESOPHAGOGASTRODUODENOSCOPY (EGD);  Surgeon: Otis Brace, MD;  Location: [facility];  Service: Gastroenterology;  Laterality: N/A;  . ESOPHAGOGASTRODUODENOSCOPY (EGD) WITH PROPOFOL Left 09/08/2015   Procedure: ESOPHAGOGASTRODUODENOSCOPY (EGD) WITH PROPOFOL;  Surgeon: Arta Silence, MD;  Location: [facility] ENDOSCOPY;  Service: Endoscopy;  Laterality: Left;  . WISDOM TOOTH EXTRACTION       OB History    Gravida  0   Para  0   Term  0   Preterm  0   AB  0   Living  0     SAB  0   IAB  0   Ectopic  0   Multiple  0   Live Births  0           Family History  Problem Relation Age of Onset  . Lung cancer Mother   . Diabetes Mother   . Bipolar disorder Father   . Liver cancer Maternal Grandfather   . Breast cancer Other        maternal great aunt  . Heart disease Other   . Ovarian cancer Other        maternal great aunt  . Pancreatic cancer Maternal Uncle   . Prostate cancer Maternal Uncle   . Diabetes Maternal Aunt  x 2  . Kidney disease Other        maternal great aunt    Social History   Tobacco Use  . Smoking status: Never Smoker  . Smokeless tobacco: Never Used  Vaping Use  . Vaping Use: Never used  Substance Use Topics  . Alcohol use: Yes    Alcohol/week: 1.0 standard drink    Types: 1 Shots of liquor per week    Comment: occasional   . Drug use: No    Home Medications Prior to Admission medications   Medication Sig Start Date End Date Taking? Authorizing Provider  atenolol (TENORMIN) 25 MG tablet Take 1 tablet (25 mg total) by mouth 2 (two) times daily. 01/30/20   Mariel Aloe, MD  Blood Glucose Monitoring Suppl (TRUE METRIX METER) w/Device KIT 1 Device by Does not apply route 4 (four) times daily. 07/05/19   Libby Maw, MD  erythromycin (ERY-TAB) 250 MG EC tablet Take 250 mg by mouth daily.    [provider]  glucose blood (TRUE METRIX BLOOD GLUCOSE TEST) test strip Use as instructed 07/05/19   Libby Maw, MD  hydrochlorothiazide (HYDRODIURIL) 25 MG tablet Take 1 tablet (25 mg total) by mouth daily. 06/14/20   [name], MD  hyoscyamine (LEVBID) 0.375 MG 12 hr tablet Take 1 tablet (0.375 mg total) by mouth every 12 (twelve) hours as needed for cramping (abdominal pain). 05/14/20   Raiford Noble Latif, DO  insulin degludec (TRESIBA FLEXTOUCH) 100 UNIT/ML FlexTouch Pen Inject 18 Units into the skin at bedtime. 06/09/20 08/09/20  Cherene Altes, MD  Insulin Pen Needle 31G X 5 MM MISC 1 Device by Does not apply route QID. For use with insulin pens 07/05/19   Libby Maw, MD  insulin regular (NOVOLIN R) 100 units/mL injection Inject 0-0.15 mLs (0-15 Units total) into the skin 3 (three) times daily before meals. CBG 70 - 120: 0 units CBG 121 - 150: 2 units CBG 151 - 200: 3 units CBG 201 - 250: 5 units CBG 251 - 300: 8 units CBG 301 - 350: 11 units CBG 351 - 400: 15 units 05/14/20   Sheikh, Omair Latif, DO  levalbuterol [facility] HFA) 45 MCG/ACT inhaler Inhale 2 puffs into the lungs every 6 (six) hours as needed for wheezing or shortness of breath. 05/28/20   Little Ishikawa, MD  lisinopril (ZESTRIL) 10 MG tablet Take 1 tablet (10 mg total) by mouth daily. 06/10/20   Cherene Altes, MD  metoCLOPramide (REGLAN) 5 MG tablet Take 1 tablet (5 mg total) by mouth 3 (three) times daily before meals. 05/21/20   Esterwood, Amy S, PA-C  Multiple Vitamin (MULTIVITAMIN WITH MINERALS) TABS tablet Take 1 tablet by mouth daily.    [provider]  ondansetron (ZOFRAN ODT) 4 MG disintegrating tablet Take 1 tablet (4 mg total) by  mouth every 6 (six) hours as needed for nausea or vomiting. 04/20/20   Esterwood, Amy S, PA-C  pantoprazole (PROTONIX) 40 MG tablet Take 1 tablet (40 mg total) by mouth daily. 07/24/20   Dwyane Dee, MD  promethazine (PHENERGAN) 12.5 MG tablet Take 1 tablet (12.5 mg total) by mouth every 6 (six) hours as needed for nausea or vomiting. 05/14/20   Raiford Noble Camak, DO  TRUEPLUS LANCETS 28G MISC assist with checking blood sugar TID and qhs 09/10/15   Zettie Pho S, PA-C  insulin aspart (NOVOLOG) 100 UNIT/ML FlexPen Inject 5 Units into the skin 3 (three) times  daily with meals. 02/23/18 07/05/19  Donne Hazel, MD    Allergies    Patient has no known allergies.  Review of Systems   Review of Systems  Constitutional: Negative for chills and fever.  HENT: Negative.   Respiratory: Negative.   Cardiovascular: Negative.   Gastrointestinal: Positive for abdominal pain, nausea and vomiting.  Musculoskeletal: Negative.  Negative for myalgias.  Skin: Negative.   Neurological: Negative.     Physical Exam Updated Vital Signs BP (!) 172/104 (BP Location: Right Arm)   Pulse (!) 140   Temp 98.2 F (36.8 C) (Oral)   Resp 20   Ht _0  (1.727 m)   Wt 72.6 kg   LMP 05/25/2020 (Approximate)   SpO2 100%   BMI 24.33 kg/m   Physical Exam Vitals and nursing note reviewed.  Constitutional:      Appearance: Normal appearance.     Comments: Uncomfortable appearing.  HENT:     Mouth/Throat:     Mouth: Mucous membranes are dry.  Cardiovascular:     Rate and Rhythm: Regular rhythm. Tachycardia present.     Heart sounds: No murmur heard.   Pulmonary:     Effort: Pulmonary effort is normal.     Breath sounds: No wheezing, rhonchi or rales.  Abdominal:     General: There is no distension.     Palpations: Abdomen is soft.     Tenderness: There is abdominal tenderness (Diffuse). There is no guarding or rebound.  Musculoskeletal:        General: Normal range of motion.  Skin:    General: Skin  is warm and dry.  Neurological:     Mental Status: She is alert and oriented to person, place, and time.     ED Results / Procedures / Treatments   Labs (all labs ordered are listed, but only abnormal results are displayed) Labs Reviewed  COMPREHENSIVE METABOLIC PANEL - Abnormal; Notable for the following components:      Result Value   Sodium 134 (*)    CO2 20 (*)    Glucose, Bld 398 (*)    BUN 28 (*)    Creatinine, Ser 1.32 (*)    Total Bilirubin 1.5 (*)    GFR, Estimated 56 (*)    All other components within normal limits  CBC WITH DIFFERENTIAL/PLATELET - Abnormal; Notable for the following components:   WBC 14.6 (*)    Hemoglobin 11.8 (*)    HCT 35.0 (*)    Neutro Abs 12.1 (*)    All other components within normal limits  LIPASE, BLOOD  URINALYSIS, ROUTINE W REFLEX MICROSCOPIC  I-STAT BETA HCG BLOOD, ED ([facility], [facility], [facility] ONLY)  CBG MONITORING, ED    EKG None  Radiology No results found.  Procedures Procedures   Medications Ordered in ED Medications - No data to display  ED Course  I have reviewed the triage vital signs and the nursing notes.  Pertinent labs & imaging results that were available during my care of the patient were reviewed by me and considered in my medical decision making (see chart for details).    MDM Rules/Calculators/A&P                          Patient to ED with recurrent symptoms c/w gastroparesis - abdominal pain, uncontrolled vomiting. She was discharged home yesterday morning after admission for same and reports her symptoms returned shortly after getting home. She tried taking her regular  medications without relief.   Labs reviewed. CO2 20, with normal anion gap. Hyperglycemia to 398. Renal function went from 0.81 at discharge yesterday to 1.32. She has a white count of 14.6, normal at discharge.   No fever. She is significantly tachycardic in the upper 130's. IV fluid bolus provided without change.   She has been given Reglan,  Zofran and Phenergan. She is tolerating ice chips but does not take more than that. No recurrent vomiting observed. Her pain continues but she appears more comfortable and is resting.   IV Protonix, pepcid provided. IV Erythromycin 250 mg also provided.   On reassessment, she remains significantly tachycardic without change. She is currently on her 3rd liter. Feel she would benefit from re-admission and continued treatment.  Final Clinical Impression(s) / ED Diagnoses Final diagnoses:  None   1. Abdominal pain 2. Tachycardia 3. Nausea, vomiting  Rx / DC Orders ED Discharge Orders    None       Dennie Bible 07/25/20 4128    Molpus, Jenny Reichmann, MD 07/25/20 [place], [place], PA-C 07/25/20 2081    Molpus, Jenny Reichmann, MD 07/25/20 [phone]

## 2020-07-25 NOTE — Discharge Summary (Signed)
Physician Discharge Summary   Kelli Hudson NGE:[id number] DOB: 1990-07-14 DOA: 07/23/2020  PCP: [name], MD  Admit date: 07/23/2020 Discharge date: 07/24/2020   Admitted From: home Disposition:  home Discharging physician: Dwyane Dee, MD  Recommendations for Outpatient Follow-up:  1. Continue ongoing DM control/management   Patient discharged to home in Discharge Condition: stable Risk of unplanned readmission score:    CODE STATUS: Full Diet recommendation:  Diet Orders (From admission, onward)    Start     Ordered   07/24/20 0000  Diet Carb Modified        07/24/20 [facility] Course:  Kelli Hudson is a 30 y.o. female with medical history significant for type 1 diabetes with gastroparesis, hypertension, depression/anxiety, recent COVID infection on 4/10 who presented with persistent nausea and vomiting.  She reported that her symptoms of nausea and vomiting started early the morning of admission.  Had abdominal gurgling.  No diarrhea.  Reports that she last took her prescription medications last night and had insulin this morning.  No fever.  Denies any history of abdominal surgery.  No tobacco or alcohol use.  Did not answer when asked about illicit drugs. She was afebrile but tachycardic with heart rate of 120s -130s and hypertensive up to systolic of 401U. Pt given Haldol, Benadry, Ativan, Reglan for antiemetic. Given Toradol, Dilaudid for pain. Lopressor also given for elevated BP and tachycardia since she has been unable to take her BP medications.   Acute gastroparesis flare -Diagnosed by gastric emptying study on 05/16/2020 -Treated with Reglan and erythromycin - Had symptomatic improvement overnight and was comfortable/amenable to discharging home for ongoing recovery  AKI - Creatinine of 1.10.  -Treated with IV fluids on admission  Uncontrolled type 1 diabetes with chronic diabetic gastroparesis with  hyperglycemia -Hemoglobin A1c of 10.2 on 06/27/2020 -Needs ongoing glucose control and management  Hypertension -  Home regimen resumed at discharge  GERD -  Home regimen continued  The patient's chronic medical conditions were treated accordingly per the patient's home medication regimen except as noted.  On day of discharge, patient was felt deemed stable for discharge. Patient/family member advised to call PCP or come back to ER if needed.   Principal Diagnosis: Gastroparesis due to DM [facility])  Discharge Diagnoses: Active Hospital Problems   Diagnosis Date Noted  . Gastroparesis due to DM ([place]) 05/11/2020  . GERD (gastroesophageal reflux disease) 07/23/2020  . Essential hypertension 10/05/2009  . Uncontrolled diabetes mellitus ([place]) 10/05/2009    Resolved Hospital Problems  No resolved problems to display.    Discharge Instructions    Diet Carb Modified   Complete by: As directed    Increase activity slowly   Complete by: As directed      Allergies as of 07/24/2020   No Known Allergies     Medication List    STOP taking these medications   famotidine 20 MG tablet Commonly known as: PEPCID     TAKE these medications   atenolol 25 MG tablet Commonly known as: TENORMIN Take 1 tablet (25 mg total) by mouth 2 (two) times daily.   erythromycin 250 MG EC tablet Commonly known as: ERY-TAB Take 250 mg by mouth daily.   hydrochlorothiazide 25 MG tablet Commonly known as: HYDRODIURIL Take 1 tablet (25 mg total) by mouth daily.   hyoscyamine 0.375 MG 12 hr tablet Commonly known as: LEVBID Take 1 tablet (0.375 mg total) by mouth every 12 (twelve)  hours as needed for cramping (abdominal pain).   Insulin Pen Needle 31G X 5 MM Misc 1 Device by Does not apply route QID. For use with insulin pens   insulin regular 100 units/mL injection Commonly known as: NovoLIN R Inject 0-0.15 mLs (0-15 Units total) into the skin 3 (three) times daily before meals. CBG 70 - 120: 0  units CBG 121 - 150: 2 units CBG 151 - 200: 3 units CBG 201 - 250: 5 units CBG 251 - 300: 8 units CBG 301 - 350: 11 units CBG 351 - 400: 15 units   levalbuterol 45 MCG/ACT inhaler Commonly known as: XOPENEX HFA Inhale 2 puffs into the lungs every 6 (six) hours as needed for wheezing or shortness of breath.   lisinopril 10 MG tablet Commonly known as: ZESTRIL Take 1 tablet (10 mg total) by mouth daily.   metoCLOPramide 5 MG tablet Commonly known as: Reglan Take 1 tablet (5 mg total) by mouth 3 (three) times daily before meals.   multivitamin with minerals Tabs tablet Take 1 tablet by mouth daily.   ondansetron 4 MG disintegrating tablet Commonly known as: Zofran ODT Take 1 tablet (4 mg total) by mouth every 6 (six) hours as needed for nausea or vomiting.   pantoprazole 40 MG tablet Commonly known as: PROTONIX Take 1 tablet (40 mg total) by mouth daily. What changed: when to take this   promethazine 12.5 MG tablet Commonly known as: PHENERGAN Take 1 tablet (12.5 mg total) by mouth every 6 (six) hours as needed for nausea or vomiting.   Tyler Aas FlexTouch 100 UNIT/ML FlexTouch Pen Generic drug: insulin degludec Inject 18 Units into the skin at bedtime.   True Metrix Blood Glucose Test test strip Generic drug: glucose blood Use as instructed   True Metrix Meter w/Device Kit 1 Device by Does not apply route 4 (four) times daily.   TRUEplus Lancets 28G Misc assist with checking blood sugar TID and qhs       No Known Allergies  Consultations:   Discharge Exam: BP (!) 155/96   Pulse (!) 110   Temp 98.2 F (36.8 C) (Oral)   Resp 16   Ht 5' 8" (1.727 m)   Wt 70.6 kg   LMP 05/23/2020   SpO2 100%   BMI 23.67 kg/m  General appearance: alert, cooperative and no distress Head: Normocephalic, without obvious abnormality, atraumatic Eyes: EOMI Lungs: clear to auscultation bilaterally Heart: regular rate and rhythm and S1, S2 normal Abdomen: normal findings:  bowel sounds normal and soft, non-tender Extremities: no edema Skin: mobility and turgor normal Neurologic: Grossly normal  The results of significant diagnostics from this hospitalization (including imaging, microbiology, ancillary and laboratory) are listed below for reference.   Microbiology: Recent Results (from the past 240 hour(s))  Resp Panel by RT-PCR (Flu A&B, Covid) Nasopharyngeal Swab     Status: Abnormal   Collection Time: 07/24/20 12:20 AM   Specimen: Nasopharyngeal Swab; Nasopharyngeal(NP) swabs in vial transport medium  Result Value Ref Range Status   SARS Coronavirus 2 by RT PCR POSITIVE (A) NEGATIVE Final    Comment: RESULT CALLED TO, READ BACK BY AND VERIFIED WITH: RN Rockne Menghini AT [id number] 07/24/20 [place] A (NOTE) SARS-CoV-2 target nucleic acids are DETECTED.  The SARS-CoV-2 RNA is generally detectable in upper respiratory specimens during the acute phase of infection. Positive results are indicative of the presence of the identified virus, but do not rule out bacterial infection or co-infection with other pathogens not detected by  the test. Clinical correlation with patient history and other diagnostic information is necessary to determine patient infection status. The expected result is Negative.  Fact Sheet for Patients: EntrepreneurPulse.com.au  Fact Sheet for Healthcare Providers: IncredibleEmployment.be  This test is not yet approved or cleared by the [place] FDA and  has been authorized for detection and/or diagnosis of SARS-CoV-2 by FDA under an Emergency Use Authorization (EUA).  This EUA will remain in effect (meaning this test  can be used) for the duration of  the COVID-19 declaration under Section 564(b)(1) of the Act, 21 U.S.C. section 360bbb-3(b)(1), unless the authorization is terminated or revoked sooner.     Influenza A by PCR NEGATIVE NEGATIVE Final   Influenza B by PCR NEGATIVE NEGATIVE Final     Comment: (NOTE) The Xpert Xpress SARS-CoV-2/FLU/RSV plus assay is intended as an aid in the diagnosis of influenza from Nasopharyngeal swab specimens and should not be used as a sole basis for treatment. Nasal washings and aspirates are unacceptable for Xpert Xpress SARS-CoV-2/FLU/RSV testing.  Fact Sheet for Patients: EntrepreneurPulse.com.au  Fact Sheet for Healthcare Providers: IncredibleEmployment.be  This test is not yet approved or cleared by the [place] FDA and has been authorized for detection and/or diagnosis of SARS-CoV-2 by FDA under an Emergency Use Authorization (EUA). This EUA will remain in effect (meaning this test can be used) for the duration of the COVID-19 declaration under Section 564(b)(1) of the Act, 21 U.S.C. section 360bbb-3(b)(1), unless the authorization is terminated or revoked.  Performed at [facility], [place] [address], [place], [place] [address]      Labs: BNP (last 3 results) No results for input(s): BNP in the last 8760 hours. Basic Metabolic Panel: Recent Labs  Lab 07/23/20 1646 07/24/20 0450 07/24/20 2035 07/25/20 0928  NA 133* 136 134* 135  K 4.7 3.9 3.5 4.0  CL 98 102 100 105  CO2 22 20* 20* 20*  GLUCOSE 428* 260* 398* 219*  BUN 26* 23* 28* 18  CREATININE 1.10* 0.88 1.32* 0.79  CALCIUM 10.3 9.7 9.8 8.6*  MG  --   --   --  1.6*   Liver Function Tests: Recent Labs  Lab 07/23/20 1646 07/24/20 2035 07/25/20 0928  AST 30 34 30  ALT 32 27 24  ALKPHOS 82 75 62  BILITOT 1.4* 1.5* 1.4*  PROT 8.8* 7.9 6.6  ALBUMIN 4.5 3.9 3.2*   Recent Labs  Lab 07/23/20 1646 07/24/20 2035  LIPASE 24 25   No results for input(s): AMMONIA in the last 168 hours. CBC: Recent Labs  Lab 07/23/20 1646 07/24/20 2035 07/25/20 0928  WBC 8.1 14.6* 14.9*  NEUTROABS  --  12.1* 11.5*  HGB 12.9 11.8* 10.4*  HCT 37.6 35.0* 31.0*  MCV 86.4 89.1 87.6  PLT 348 345 304   Cardiac  Enzymes: No results for input(s): CKTOTAL, CKMB, CKMBINDEX, TROPONINI in the last 168 hours. BNP: Invalid input(s): POCBNP CBG: Recent Labs  Lab 07/25/20 0250 07/25/20 0503 07/25/20 0755 07/25/20 1142 07/25/20 1634  GLUCAP 380* 282* 207* 225* 258*   D-Dimer No results for input(s): DDIMER in the last 72 hours. Hgb A1c No results for input(s): HGBA1C in the last 72 hours. Lipid Profile No results for input(s): CHOL, HDL, LDLCALC, TRIG, CHOLHDL, LDLDIRECT in the last 72 hours. Thyroid function studies No results for input(s): TSH, T4TOTAL, T3FREE, THYROIDAB in the last 72 hours.  Invalid input(s): FREET3 Anemia work up No results for input(s): VITAMINB12, FOLATE, FERRITIN, TIBC, IRON, RETICCTPCT  in the last 72 hours. Urinalysis    Component Value Date/Time   COLORURINE YELLOW 07/24/2020 2005   APPEARANCEUR CLEAR 07/24/2020 2005   LABSPEC 1.022 07/24/2020 2005   PHURINE 5.0 07/24/2020 2005   GLUCOSEU >=500 (A) 07/24/2020 2005   GLUCOSEU NEGATIVE 05/22/2020 1417   HGBUR SMALL (A) 07/24/2020 2005   BILIRUBINUR NEGATIVE 07/24/2020 2005   KETONESUR 80 (A) 07/24/2020 2005   PROTEINUR 100 (A) 07/24/2020 2005   UROBILINOGEN 0.2 05/22/2020 1417   NITRITE NEGATIVE 07/24/2020 2005   LEUKOCYTESUR NEGATIVE 07/24/2020 2005   Sepsis Labs Invalid input(s): PROCALCITONIN,  WBC,  LACTICIDVEN Microbiology Recent Results (from the past 240 hour(s))  Resp Panel by RT-PCR (Flu A&B, Covid) Nasopharyngeal Swab     Status: Abnormal   Collection Time: 07/24/20 12:20 AM   Specimen: Nasopharyngeal Swab; Nasopharyngeal(NP) swabs in vial transport medium  Result Value Ref Range Status   SARS Coronavirus 2 by RT PCR POSITIVE (A) NEGATIVE Final    Comment: RESULT CALLED TO, READ BACK BY AND VERIFIED WITH: RN Rockne Menghini AT [phone] 07/24/20 CRU[place]ANK A (NOTE) SARS-CoV-2 target nucleic acids are DETECTED.  The SARS-CoV-2 RNA is generally detectable in upper respiratory specimens during the acute  phase of infection. Positive results are indicative of the presence of the identified virus, but do not rule out bacterial infection or co-infection with other pathogens not detected by the test. Clinical correlation with patient history and other diagnostic information is necessary to determine patient infection status. The expected result is Negative.  Fact Sheet for Patients: EntrepreneurPulse.com.au  Fact Sheet for Healthcare Providers: IncredibleEmployment.be  This test is not yet approved or cleared by the [place] FDA and  has been authorized for detection and/or diagnosis of SARS-CoV-2 by FDA under an Emergency Use Authorization (EUA).  This EUA will remain in effect (meaning this test  can be used) for the duration of  the COVID-19 declaration under Section 564(b)(1) of the Act, 21 U.S.C. section 360bbb-3(b)(1), unless the authorization is terminated or revoked sooner.     Influenza A by PCR NEGATIVE NEGATIVE Final   Influenza B by PCR NEGATIVE NEGATIVE Final    Comment: (NOTE) The Xpert Xpress SARS-CoV-2/FLU/RSV plus assay is intended as an aid in the diagnosis of influenza from Nasopharyngeal swab specimens and should not be used as a sole basis for treatment. Nasal washings and aspirates are unacceptable for Xpert Xpress SARS-CoV-2/FLU/RSV testing.  Fact Sheet for Patients: EntrepreneurPulse.com.au  Fact Sheet for Healthcare Providers: IncredibleEmployment.be  This test is not yet approved or cleared by the [place] FDA and has been authorized for detection and/or diagnosis of SARS-CoV-2 by FDA under an Emergency Use Authorization (EUA). This EUA will remain in effect (meaning this test can be used) for the duration of the COVID-19 declaration under Section 564(b)(1) of the Act, 21 U.S.C. section 360bbb-3(b)(1), unless the authorization is terminated or revoked.  Performed at  [facility], [place] [address], [place], [place] [address]     Procedures/Studies: No results found.   Time coordinating discharge: Over 30 minutes    Dwyane Dee, MD  [facility] 07/25/2020, 5:20 PM

## 2020-07-25 NOTE — H&P (Signed)
History and Physical    Adelisa Satterwhite YTK:[id number] DOB: 1990/11/13 DOA: 07/24/2020  PCP: [name], MD  Patient coming from: Home  Chief Complaint: N/V, ab pain  HPI: Kelli Hudson is a 30 y.o. female with medical history significant of DMt1, gastroparesis, HTN. Presenting with N/V abdominal pain. She reports her symptoms started 3 days ago. She was vomiting despite her regular gastroparesis regimen. She had increasing abdominal soreness with the vomiting .She came to the ED and was evaluated ON. She improved and was able to go home. Her symptoms returned yesterday morning and have steadily worsened. So she decided to come back to the ED.    ED Course: She was given fluids, reglan, erytho and diluadid. Her symptoms did not improved. TRH was called for admission.   Review of Systems:  Denies CP, lightheadedness, syncopal episodes, diarrhea, fever. Review of systems is otherwise negative for all not mentioned in HPI.   PMHx Past Medical History:  Diagnosis Date  . Acute H. pylori gastric ulcer   . Diabetes mellitus ([place])   . Diabetic gastroparesis ([place])   . Gastroparesis   . GERD (gastroesophageal reflux disease)   . Hypertension   . Hyperthyroidism     PSHx Past Surgical History:  Procedure Laterality Date  . AMPUTATION TOE Left 03/10/2018   Procedure: AMPUTATION FIFTH TOE;  Surgeon: Trula Slade, DPM;  Location: [facility];  Service: Podiatry;  Laterality: Left;  . BIOPSY  01/28/2020   Procedure: BIOPSY;  Surgeon: Otis Brace, MD;  Location: [facility];  Service: Gastroenterology;;  . ESOPHAGOGASTRODUODENOSCOPY N/A 01/28/2020   Procedure: ESOPHAGOGASTRODUODENOSCOPY (EGD);  Surgeon: Otis Brace, MD;  Location: [facility];  Service: Gastroenterology;  Laterality: N/A;  . ESOPHAGOGASTRODUODENOSCOPY (EGD) WITH PROPOFOL Left 09/08/2015   Procedure: ESOPHAGOGASTRODUODENOSCOPY (EGD) WITH PROPOFOL;  Surgeon: Arta Silence, MD;   Location: [facility] ENDOSCOPY;  Service: Endoscopy;  Laterality: Left;  . WISDOM TOOTH EXTRACTION      SocHx  reports that she has never smoked. She has never used smokeless tobacco. She reports current alcohol use of about 1.0 standard drink of alcohol per week. She reports that she does not use drugs.  No Known Allergies  FamHx Family History  Problem Relation Age of Onset  . Lung cancer Mother   . Diabetes Mother   . Bipolar disorder Father   . Liver cancer Maternal Grandfather   . Breast cancer Other        maternal great aunt  . Heart disease Other   . Ovarian cancer Other        maternal great aunt  . Pancreatic cancer Maternal Uncle   . Prostate cancer Maternal Uncle   . Diabetes Maternal Aunt        x 2  . Kidney disease Other        maternal great aunt    Prior to Admission medications   Medication Sig Start Date End Date Taking? Authorizing Provider  atenolol (TENORMIN) 25 MG tablet Take 1 tablet (25 mg total) by mouth 2 (two) times daily. 01/30/20  Yes Mariel Aloe, MD  Blood Glucose Monitoring Suppl (TRUE METRIX METER) w/Device KIT 1 Device by Does not apply route 4 (four) times daily. 07/05/19  Yes Libby Maw, MD  erythromycin (ERY-TAB) 250 MG EC tablet Take 250 mg by mouth daily.   Yes [provider]  glucose blood (TRUE METRIX BLOOD GLUCOSE TEST) test strip Use as instructed 07/05/19  Yes Libby Maw, MD  hydrochlorothiazide (  HYDRODIURIL) 25 MG tablet Take 1 tablet (25 mg total) by mouth daily. 06/14/20  Yes [name], MD  hyoscyamine (LEVBID) 0.375 MG 12 hr tablet Take 1 tablet (0.375 mg total) by mouth every 12 (twelve) hours as needed for cramping (abdominal pain). 05/14/20  Yes Sheikh, Omair Latif, DO  insulin degludec (TRESIBA FLEXTOUCH) 100 UNIT/ML FlexTouch Pen Inject 18 Units into the skin at bedtime. 06/09/20 08/09/20 Yes Cherene Altes, MD  Insulin Pen Needle 31G X 5 MM MISC 1 Device by Does not apply route QID. For use  with insulin pens 07/05/19  Yes Libby Maw, MD  insulin regular (NOVOLIN R) 100 units/mL injection Inject 0-0.15 mLs (0-15 Units total) into the skin 3 (three) times daily before meals. CBG 70 - 120: 0 units CBG 121 - 150: 2 units CBG 151 - 200: 3 units CBG 201 - 250: 5 units CBG 251 - 300: 8 units CBG 301 - 350: 11 units CBG 351 - 400: 15 units 05/14/20  Yes Sheikh, Omair Latif, DO  levalbuterol [facility] HFA) 45 MCG/ACT inhaler Inhale 2 puffs into the lungs every 6 (six) hours as needed for wheezing or shortness of breath. 05/28/20  Yes Little Ishikawa, MD  lisinopril (ZESTRIL) 10 MG tablet Take 1 tablet (10 mg total) by mouth daily. 06/10/20  Yes Cherene Altes, MD  metoCLOPramide (REGLAN) 5 MG tablet Take 1 tablet (5 mg total) by mouth 3 (three) times daily before meals. 05/21/20  Yes Esterwood, Amy S, PA-C  Multiple Vitamin (MULTIVITAMIN WITH MINERALS) TABS tablet Take 1 tablet by mouth daily.   Yes [provider]  ondansetron (ZOFRAN ODT) 4 MG disintegrating tablet Take 1 tablet (4 mg total) by mouth every 6 (six) hours as needed for nausea or vomiting. 04/20/20  Yes Esterwood, Amy S, PA-C  pantoprazole (PROTONIX) 40 MG tablet Take 1 tablet (40 mg total) by mouth daily. 07/24/20  Yes Dwyane Dee, MD  promethazine (PHENERGAN) 12.5 MG tablet Take 1 tablet (12.5 mg total) by mouth every 6 (six) hours as needed for nausea or vomiting. 05/14/20  Yes Raiford Noble [place], DO  TRUEPLUS LANCETS 28G MISC assist with checking blood sugar TID and qhs 09/10/15   Zettie Pho S, PA-C  insulin aspart (NOVOLOG) 100 UNIT/ML FlexPen Inject 5 Units into the skin 3 (three) times daily with meals. 02/23/18 07/05/19  Donne Hazel, MD    Physical Exam: Vitals:   07/24/20 1959 07/25/20 0024 07/25/20 0253 07/25/20 0430  BP: (!) 162/99 (!) 172/104 (!) 154/97 (!) 158/92  Pulse: (!) 134 (!) 140 (!) 140 (!) 138  Resp: _0 Temp: 98.2 F (36.8 C)     TempSrc: Oral     SpO2: 100%  100% 100% 97%  Weight: 72.6 kg     Height: _1  (1.727 m)       General: 30 y.o. female resting in bed in NAD Eyes: PERRL, normal sclera ENMT: Nares patent w/o discharge, orophaynx clear, dentition normal, ears w/o discharge/lesions/ulcers Neck: Supple, trachea midline Cardiovascular: tachy, +S1, S2, no m/g/r, equal pulses throughout Respiratory: CTABL, no w/r/r, normal WOB GI: BS+, ND, global TTP, no masses noted, no organomegaly noted MSK: No e/c/c Skin: No rashes, bruises, ulcerations noted Neuro: A&O x 3, no focal deficits Psyc: Appropriate interaction and affect, calm/cooperative  Labs on Admission: I have personally reviewed following labs and imaging studies  CBC: Recent Labs  Lab 07/23/20 1646 07/24/20 2035  WBC 8.1 14.6*  NEUTROABS  --  12.1*  HGB 12.9 11.8*  HCT 37.6 35.0*  MCV 86.4 89.1  PLT 348 224   Basic Metabolic Panel: Recent Labs  Lab 07/23/20 1646 07/24/20 0450 07/24/20 2035  NA 133* 136 134*  K 4.7 3.9 3.5  CL 98 102 100  CO2 22 20* 20*  GLUCOSE 428* 260* 398*  BUN 26* 23* 28*  CREATININE 1.10* 0.88 1.32*  CALCIUM 10.3 9.7 9.8   GFR: Estimated Creatinine Clearance: 63.4 mL/min (A) (by C-G formula based on SCr of 1.32 mg/dL (H)). Liver Function Tests: Recent Labs  Lab 07/23/20 1646 07/24/20 2035  AST 30 34  ALT 32 27  ALKPHOS 82 75  BILITOT 1.4* 1.5*  PROT 8.8* 7.9  ALBUMIN 4.5 3.9   Recent Labs  Lab 07/23/20 1646 07/24/20 2035  LIPASE 24 25   No results for input(s): AMMONIA in the last 168 hours. Coagulation Profile: No results for input(s): INR, PROTIME in the last 168 hours. Cardiac Enzymes: No results for input(s): CKTOTAL, CKMB, CKMBINDEX, TROPONINI in the last 168 hours. BNP (last 3 results) No results for input(s): PROBNP in the last 8760 hours. HbA1C: No results for input(s): HGBA1C in the last 72 hours. CBG: Recent Labs  Lab 07/23/20 2219 07/24/20 0433 07/24/20 0913 07/25/20 0250 07/25/20 0503  GLUCAP 271*  230* 218* 380* 282*   Lipid Profile: No results for input(s): CHOL, HDL, LDLCALC, TRIG, CHOLHDL, LDLDIRECT in the last 72 hours. Thyroid Function Tests: No results for input(s): TSH, T4TOTAL, FREET4, T3FREE, THYROIDAB in the last 72 hours. Anemia Panel: No results for input(s): VITAMINB12, FOLATE, FERRITIN, TIBC, IRON, RETICCTPCT in the last 72 hours. Urine analysis:    Component Value Date/Time   COLORURINE YELLOW 07/24/2020 2005   APPEARANCEUR CLEAR 07/24/2020 2005   LABSPEC 1.022 07/24/2020 2005   PHURINE 5.0 07/24/2020 2005   GLUCOSEU >=500 (A) 07/24/2020 2005   GLUCOSEU NEGATIVE 05/22/2020 1417   HGBUR SMALL (A) 07/24/2020 2005   BILIRUBINUR NEGATIVE 07/24/2020 2005   KETONESUR 80 (A) 07/24/2020 2005   PROTEINUR 100 (A) 07/24/2020 2005   UROBILINOGEN 0.2 05/22/2020 1417   NITRITE NEGATIVE 07/24/2020 2005   LEUKOCYTESUR NEGATIVE 07/24/2020 2005    Radiological Exams on Admission: No results found.  EKG: None obtained in ED  Assessment/Plan DM type 1 w/ gastroparesis, uncontrolled Intractable N/V Chronic abdominal pain     - place in obs, tele     - reglan IV scheduled, dronabinol PO scheduled, phenergan PR, PRN     - CLD, advance as tolerated     - lantus, SSI, glucose checks  Dehydration AKI Hyperbilirubinemia     - she's dry     - NS @ 100cc/hr; follow     - repeat t bili in AM  HTN     - PRN metoprolol, resume home meds as able  Normocytic anemia     - no evidence of bleed, check iron studies  Recent COVID+ test     - positive in April; reports that she had treatment     - she is outside the 21 isolation period and doesn't have any respiratory symptoms, d/c isolation  DVT prophylaxis: lovenox  Code Status: FULL  Family Communication: None at bedside  Consults called: None   Status is: Observation  The patient remains OBS appropriate and will d/c before 2 midnights.  Dispo: The patient is from: Home              Anticipated d/c is to: Home  Patient currently is not medically stable to d/c.   Difficult to place patient No  Jonnie Finner DO [facility]  If 7PM-7AM, please contact night-coverage [email or web address]  07/25/2020, 7:18 AM

## 2020-07-26 LAB — COMPREHENSIVE METABOLIC PANEL
ALT: 22 U/L (ref 0–44)
AST: 24 U/L (ref 15–41)
Albumin: 3.2 g/dL — ABNORMAL LOW (ref 3.5–5.0)
Alkaline Phosphatase: 66 U/L (ref 38–126)
Anion gap: 9 (ref 5–15)
BUN: 14 mg/dL (ref 6–20)
CO2: 24 mmol/L (ref 22–32)
Calcium: 9.6 mg/dL (ref 8.9–10.3)
Chloride: 104 mmol/L (ref 98–111)
Creatinine, Ser: 0.7 mg/dL (ref 0.44–1.00)
GFR, Estimated: >60 mL/min (ref >60)
Glucose, Bld: 215 mg/dL — ABNORMAL HIGH (ref 70–99)
Potassium: 3.8 mmol/L (ref 3.5–5.1)
Sodium: 137 mmol/L (ref 135–145)
Total Bilirubin: 1.2 mg/dL (ref 0.3–1.2)
Total Protein: 6.8 g/dL (ref 6.5–8.1)

## 2020-07-26 LAB — CBC
HCT: 34.5 % — ABNORMAL LOW (ref 36.0–46.0)
Hemoglobin: 11.5 g/dL — ABNORMAL LOW (ref 12.0–15.0)
MCH: 29.7 pg (ref 26.0–34.0)
MCHC: 33.3 g/dL (ref 30.0–36.0)
MCV: 89.1 fL (ref 80.0–100.0)
Platelets: 308 10*3/uL (ref 150–400)
RBC: 3.87 MIL/uL (ref 3.87–5.11)
RDW: 12.6 % (ref 11.5–15.5)
WBC: 12.9 10*3/uL — ABNORMAL HIGH (ref 4.0–10.5)
nRBC: 0 % (ref 0.0–0.2)

## 2020-07-26 LAB — GLUCOSE, CAPILLARY
Glucose-Capillary: 107 mg/dL — ABNORMAL HIGH (ref 70–99)
Glucose-Capillary: 146 mg/dL — ABNORMAL HIGH (ref 70–99)
Glucose-Capillary: 162 mg/dL — ABNORMAL HIGH (ref 70–99)
Glucose-Capillary: 197 mg/dL — ABNORMAL HIGH (ref 70–99)
Glucose-Capillary: 200 mg/dL — ABNORMAL HIGH (ref 70–99)
Glucose-Capillary: 227 mg/dL — ABNORMAL HIGH (ref 70–99)

## 2020-07-26 NOTE — Progress Notes (Signed)
   07/26/20 0625  Assess: MEWS Score  Temp 98.6 F (37 C)  BP (!) 138/92  Pulse Rate (!) 103  Resp 18  SpO2 98 %  O2 Device Room Air  Patient Activity (if Appropriate) In bed  Assess: MEWS Score  MEWS Temp 0  MEWS Systolic 0  MEWS Pulse 1  MEWS RR 0  MEWS LOC 0  MEWS Score 1  MEWS Score Color Green  Assess: if the MEWS score is Yellow or Red  Were vital signs taken at a resting state? Yes  Focused Assessment No change from prior assessment  Does the patient meet 2 or more of the SIRS criteria? No  Does the patient have a confirmed or suspected source of infection? No  MEWS guidelines implemented *See Row Information* No, other (Comment) (completed yellow mews guidelines)  Document  Patient Outcome Other (Comment) (remain on the unit)  Progress note created (see row info) Yes  Assess: SIRS CRITERIA  SIRS Temperature  0  SIRS Pulse 1  SIRS Respirations  0  SIRS WBC 0  SIRS Score Sum  1  Patient is now in the green after interventions throughout the shift. Will continue to monitor the patient.

## 2020-07-26 NOTE — Progress Notes (Signed)
Initial Nutrition Assessment  INTERVENTION:   Once diet advanced: -Ensure Enlive po BID, each supplement provides 350 kcal and 20 grams of protein   -Placed "Gastroparesis" diet handout in discharge instructions  NUTRITION DIAGNOSIS:   Inadequate oral intake related to  (gastroparesis) as evidenced by per patient/family report.  GOAL:   Patient will meet greater than or equal to 90% of their needs  MONITOR:   PO intake, Supplement acceptance, Labs, Weight trends, I & O's  REASON FOR ASSESSMENT:   Malnutrition Screening Tool    ASSESSMENT:   30 y.o. female with medical history significant of DMt1, gastroparesis, HTN. Presenting with N/V abdominal pain. She reports her symptoms started 3 days ago. She was vomiting despite her regular gastroparesis regimen. She had increasing abdominal soreness with the vomiting .She came to the ED and was evaluated ON. She improved and was able to go home. Her symptoms returned yesterday morning and have steadily worsened. So she decided to come back to the ED.  Patient in room, seems visibly uncomfortable. Reports that she tends to have gastroparesis flares 1-2 x month. Pt reports she has had diet education before regarding gastroparesis, no questions for RD. Pt reports eating chicken nuggets on 6/5 prior to symptoms starting. Will place handout  in discharge instructions as pt eating foods that could make symptoms worse. Pt states she drinks Ensure/Boost at home, will continue once diet is advanced.  Per pt, she has lost 6-7 lbs. Per weight records, pt has lost 5 lbs since 4/28 which is insignificant for time frame.   Medications: Marinol, Reglan  Labs reviewed:  CBGs: 162-227  NUTRITION - FOCUSED PHYSICAL EXAM:  No depletions noted.  Diet Order:   Diet Order             Diet clear liquid Room service appropriate? Yes; Fluid consistency: Thin  Diet effective now                   EDUCATION NEEDS:   Education needs have been  addressed  Skin:  Skin Assessment: Reviewed RN Assessment  Last BM:  6/7  Height:   Ht Readings from Last 1 Encounters:  07/24/20 5\' 8"  (1.727 m)    Weight:   Wt Readings from Last 1 Encounters:  07/24/20 72.6 kg    BMI:  Body mass index is 24.33 kg/m.  Estimated Nutritional Needs:   Kcal:  1800-2000  Protein:  60-75g  Fluid:  1.8L/day   09/23/20, MS, RD, LDN Inpatient Clinical Dietitian Contact information available via Amion

## 2020-07-26 NOTE — Progress Notes (Signed)
   07/26/20 0216  Assess: MEWS Score  Temp 97.7 F (36.5 C)  BP (!) 140/103  Pulse Rate (!) 104  Resp 18  SpO2 100 %  O2 Device Room Air  Assess: MEWS Score  MEWS Temp 0  MEWS Systolic 0  MEWS Pulse 1  MEWS RR 0  MEWS LOC 0  MEWS Score 1  MEWS Score Color Green  Assess: if the MEWS score is Yellow or Red  Were vital signs taken at a resting state? Yes  Focused Assessment No change from prior assessment  Does the patient meet 2 or more of the SIRS criteria? No  Does the patient have a confirmed or suspected source of infection? No  MEWS guidelines implemented *See Row Information* No, previously yellow, continue vital signs every 4 hours  Treat  MEWS Interventions Administered prn meds/treatments  Pain Scale 0-10  Pain Score 10  Faces Pain Scale 6  Pain Type Acute pain  Pain Location Epigastric  Pain Orientation Right;Left  Pain Descriptors / Indicators Aching;Burning;Throbbing  Pain Frequency Constant  Pain Onset On-going  Patients Stated Pain Goal 0  Pain Intervention(s) Medication (See eMAR)  Multiple Pain Sites No  Document  Patient Outcome Other (Comment) (remain on unit with no distress)  Progress note created (see row info) Yes  Assess: SIRS CRITERIA  SIRS Temperature  0  SIRS Pulse 1  SIRS Respirations  0  SIRS WBC 0  SIRS Score Sum  1  Patient still in yellow MEWS. Pain is 10/10, PRN administered. No distress noted. Will continue to monitor the patient.

## 2020-07-26 NOTE — Progress Notes (Signed)
Progress Note    Kelli Hudson   KAJ:[id number]  DOB: [birth year]  DOA: 07/24/2020     1  PCP: Loyola Mast, MD  CC: N/V, abd pain  Hospital Course: Kelli Hudson is a 30 y.o. female with medical history significant of DMt1, gastroparesis, HTN. Presenting with N/V abdominal pain. She reports her symptoms started 3 days ago. She was vomiting despite her regular gastroparesis regimen. She had increasing abdominal soreness with the vomiting . She was recently discharged on 07/24/2020 but due to lingering symptoms and unable to tolerate oral nutrition at home, she presented back to the ER.  Interval History:  Feeling still fatigued/tired and poor appetite.  Spoke with her mother on the phone as well while in the room.  Patient does not have inclination for advancing diet yet.  ROS: Constitutional: negative for chills and fevers, Respiratory: negative for cough, Cardiovascular: negative for chest pain, and Gastrointestinal: positive for abdominal pain, nausea, and vomiting  Assessment & Plan: DM type 1 w/ gastroparesis, uncontrolled Intractable N/V Chronic abdominal pain -Continue clear liquid diet - Continue fluids - Continue erythromycin, Reglan - continue SSI and CBG monitoring    Dehydration AKI Hyperbilirubinemia     - continue IVF   HTN     - PRN metoprolol, resume home meds as able   Normocytic anemia     - no evidence of bleed, check iron studies   Recent COVID+ test     - positive in April; reports that she had treatment     - she is outside the 21 isolation period and doesn't have any respiratory symptoms, d/c isolation    Old records reviewed in assessment of this patient  Antimicrobials:   DVT prophylaxis: enoxaparin (LOVENOX) injection 40 mg Start: 07/25/20 2200   Code Status:   Code Status: Full Code Family Communication: mother  Disposition Plan: Status is: Inpatient  Remains inpatient appropriate because:Persistent severe  electrolyte disturbances, IV treatments appropriate due to intensity of illness or inability to take PO, and Inpatient level of care appropriate due to severity of illness  Dispo: The patient is from: Home              Anticipated d/c is to: Home              Patient currently is not medically stable to d/c.   Difficult to place patient No  Risk of unplanned readmission score: Unplanned Admission- Pilot do not use: 32.09   Objective: Blood pressure (!) 138/92, pulse (!) 103, temperature 98.6 F (37 C), resp. rate 18, height 5\' 8"  (1.727 m), weight 72.6 kg, last menstrual period 05/25/2020, SpO2 98 %.  Examination: General appearance:  Fatigued appearing but awake/alert and resting in bed in no distress.  Appears comfortable Head: Normocephalic, without obvious abnormality, atraumatic Eyes:  EOMI Lungs: clear to auscultation bilaterally Heart: regular rate and rhythm and S1, S2 normal Abdomen: normal findings: bowel sounds normal and soft, non-tender Extremities:  No edema Skin: mobility and turgor normal Neurologic: Grossly normal  Consultants:    Procedures:    Data Reviewed: I have personally reviewed following labs and imaging studies Results for orders placed or performed during the hospital encounter of 07/24/20 (from the past 24 hour(s))  Glucose, capillary     Status: Abnormal   Collection Time: 07/25/20  9:22 PM  Result Value Ref Range   Glucose-Capillary 178 (H) 70 - 99 mg/dL  Glucose, capillary     Status: Abnormal   Collection Time: 07/26/20  12:10 AM  Result Value Ref Range   Glucose-Capillary 197 (H) 70 - 99 mg/dL  Glucose, capillary     Status: Abnormal   Collection Time: 07/26/20  4:25 AM  Result Value Ref Range   Glucose-Capillary 200 (H) 70 - 99 mg/dL  Comprehensive metabolic panel     Status: Abnormal   Collection Time: 07/26/20  5:38 AM  Result Value Ref Range   Sodium 137 135 - 145 mmol/L   Potassium 3.8 3.5 - 5.1 mmol/L   Chloride 104 98 - 111  mmol/L   CO2 24 22 - 32 mmol/L   Glucose, Bld 215 (H) 70 - 99 mg/dL   BUN 14 6 - 20 mg/dL   Creatinine, Ser 7.35 0.44 - 1.00 mg/dL   Calcium 9.6 8.9 - 32.9 mg/dL   Total Protein 6.8 6.5 - 8.1 g/dL   Albumin 3.2 (L) 3.5 - 5.0 g/dL   AST 24 15 - 41 U/L   ALT 22 0 - 44 U/L   Alkaline Phosphatase 66 38 - 126 U/L   Total Bilirubin 1.2 0.3 - 1.2 mg/dL   GFR, Estimated >92 >42 mL/min   Anion gap 9 5 - 15  CBC     Status: Abnormal   Collection Time: 07/26/20  5:38 AM  Result Value Ref Range   WBC 12.9 (H) 4.0 - 10.5 K/uL   RBC 3.87 3.87 - 5.11 MIL/uL   Hemoglobin 11.5 (L) 12.0 - 15.0 g/dL   HCT 68.3 (L) 41.9 - 62.2 %   MCV 89.1 80.0 - 100.0 fL   MCH 29.7 26.0 - 34.0 pg   MCHC 33.3 30.0 - 36.0 g/dL   RDW 29.7 98.9 - 21.1 %   Platelets 308 150 - 400 K/uL   nRBC 0.0 0.0 - 0.2 %  Glucose, capillary     Status: Abnormal   Collection Time: 07/26/20  7:34 AM  Result Value Ref Range   Glucose-Capillary 227 (H) 70 - 99 mg/dL  Glucose, capillary     Status: Abnormal   Collection Time: 07/26/20 11:18 AM  Result Value Ref Range   Glucose-Capillary 162 (H) 70 - 99 mg/dL    Recent Results (from the past 240 hour(s))  Resp Panel by RT-PCR (Flu A&B, Covid) Nasopharyngeal Swab     Status: Abnormal   Collection Time: 07/24/20 12:20 AM   Specimen: Nasopharyngeal Swab; Nasopharyngeal(NP) swabs in vial transport medium  Result Value Ref Range Status   SARS Coronavirus 2 by RT PCR POSITIVE (A) NEGATIVE Final    Comment: RESULT CALLED TO, READ BACK BY AND VERIFIED WITH: RN Legrand Pitts AT [id number] 07/24/20 CRUICKSHANK A (NOTE) SARS-CoV-2 target nucleic acids are DETECTED.  The SARS-CoV-2 RNA is generally detectable in upper respiratory specimens during the acute phase of infection. Positive results are indicative of the presence of the identified virus, but do not rule out bacterial infection or co-infection with other pathogens not detected by the test. Clinical correlation with patient history and other  diagnostic information is necessary to determine patient infection status. The expected result is Negative.  Fact Sheet for Patients: BloggerCourse.com  Fact Sheet for Healthcare Providers: SeriousBroker.it  This test is not yet approved or cleared by the [place] FDA and  has been authorized for detection and/or diagnosis of SARS-CoV-2 by FDA under an Emergency Use Authorization (EUA).  This EUA will remain in effect (meaning this test  can be used) for the duration of  the COVID-19 declaration under Section 564(b)(1) of the  Act, 21 U.S.C. section 360bbb-3(b)(1), unless the authorization is terminated or revoked sooner.     Influenza A by PCR NEGATIVE NEGATIVE Final   Influenza B by PCR NEGATIVE NEGATIVE Final    Comment: (NOTE) The Xpert Xpress SARS-CoV-2/FLU/RSV plus assay is intended as an aid in the diagnosis of influenza from Nasopharyngeal swab specimens and should not be used as a sole basis for treatment. Nasal washings and aspirates are unacceptable for Xpert Xpress SARS-CoV-2/FLU/RSV testing.  Fact Sheet for Patients: BloggerCourse.com  Fact Sheet for Healthcare Providers: SeriousBroker.it  This test is not yet approved or cleared by the [place] FDA and has been authorized for detection and/or diagnosis of SARS-CoV-2 by FDA under an Emergency Use Authorization (EUA). This EUA will remain in effect (meaning this test can be used) for the duration of the COVID-19 declaration under Section 564(b)(1) of the Act, 21 U.S.C. section 360bbb-3(b)(1), unless the authorization is terminated or revoked.  Performed at [facility], [address], [place], [place] [address]      Radiology Studies: No results found. No orders to display    Scheduled Meds:  atenolol  25 mg Oral BID   dronabinol  5 mg Oral BID AC   enoxaparin (LOVENOX)  injection  40 mg Subcutaneous Q24H   erythromycin  250 mg Oral Daily   insulin aspart  0-15 Units Subcutaneous TID WC   insulin glargine  15 Units Subcutaneous Daily   metoCLOPramide (REGLAN) injection  10 mg Intravenous Q8H   pantoprazole (PROTONIX) IV  40 mg Intravenous Q24H   PRN Meds: HYDROmorphone (DILAUDID) injection, hyoscyamine, levalbuterol, metoprolol tartrate, promethazine Continuous Infusions:  sodium chloride 100 mL/hr at 07/26/20 1338     LOS: 1 day  Time spent: Greater than 50% of the 35 minute visit was spent in counseling/coordination of care for the patient as laid out in the A&P.   Lewie Chamber, MD [facility] 07/26/2020, 5:02 PM

## 2020-07-27 DIAGNOSIS — K3184 Gastroparesis: Secondary | ICD-10-CM

## 2020-07-27 LAB — GLUCOSE, CAPILLARY
Glucose-Capillary: 161 mg/dL — ABNORMAL HIGH (ref 70–99)
Glucose-Capillary: 206 mg/dL — ABNORMAL HIGH (ref 70–99)
Glucose-Capillary: 257 mg/dL — ABNORMAL HIGH (ref 70–99)
Glucose-Capillary: 76 mg/dL (ref 70–99)
Glucose-Capillary: 91 mg/dL (ref 70–99)
Glucose-Capillary: 97 mg/dL (ref 70–99)

## 2020-07-27 NOTE — Consult Note (Signed)
[facility] CM Inpatient Consult   07/27/2020  Kelli Hudson 1990-07-26 [id number]  Patient is currently active with [facility] Care Management for chronic disease management services.  Patient has been engaged by a [facility] doctor at primary care office, Adult nurse at [employer].  Plan: Will continue to follow for progression and disposition plans and update outpatient CCM.  Of note, [facility] Care Management services does not replace or interfere with any services that are arranged by inpatient case management or social work.   Christophe Louis, MSN, RN [facility] Liaison Nurse Mobile Phone [phone]  Toll free office [phone]

## 2020-07-27 NOTE — Progress Notes (Signed)
Progress Note    Kelli Hudson   EPP:[id number]  DOB: 03-24-1990  DOA: 07/24/2020     2  PCP: Loyola Mast, MD  CC: N/V, abd pain  Hospital Course: Kelli Hudson is a 30 y.o. female with medical history significant of DMt1, gastroparesis, HTN. Presenting with N/V abdominal pain. She reports her symptoms started 3 days ago. She was vomiting despite her regular gastroparesis regimen. She had increasing abdominal soreness with the vomiting . She was recently discharged on 07/24/2020 but due to lingering symptoms and unable to tolerate oral nutrition at home, she presented back to the ER.  Interval History:  Appears more improved compared to yesterday.  Tolerating liquids and wishes to advance diet further today and see how she does.  Not having any more significant vomiting or abdominal pain.  ROS: Constitutional: negative for chills and fevers, Respiratory: negative for cough, Cardiovascular: negative for chest pain, and Gastrointestinal: positive for abdominal pain, nausea, and vomiting  Assessment & Plan: DM type 1 w/ gastroparesis, uncontrolled Intractable N/V Chronic abdominal pain -Advance diet to carb consistent and monitor response overnight - Continue erythromycin, Reglan - continue SSI and CBG monitoring    Dehydration AKI Hyperbilirubinemia     -Improved with fluids   HTN     - PRN metoprolol, resume home meds as able   Normocytic anemia     - no evidence of bleed   Recent COVID+ test     - positive in April; reports that she had treatment     - she is outside the 21 isolation period and doesn't have any respiratory symptoms, d/c isolation    Old records reviewed in assessment of this patient  Antimicrobials:   DVT prophylaxis: enoxaparin (LOVENOX) injection 40 mg Start: 07/25/20 2200   Code Status:   Code Status: Full Code Family Communication: mother  Disposition Plan: Status is: Inpatient  Remains inpatient appropriate  because:Persistent severe electrolyte disturbances, IV treatments appropriate due to intensity of illness or inability to take PO, and Inpatient level of care appropriate due to severity of illness  Dispo: The patient is from: Home              Anticipated d/c is to: Home              Patient currently is not medically stable to d/c.   Difficult to place patient No  Risk of unplanned readmission score: Unplanned Admission- Pilot do not use: 32.4   Objective: Blood pressure 101/69, pulse 89, temperature 97.6 F (36.4 C), temperature source Oral, resp. rate 17, height 5\' 8"  (1.727 m), weight 72.6 kg, last menstrual period 05/25/2020, SpO2 100 %.  Examination: General appearance:  Less fatigued appearing and resting in bed comfortably when seen this morning Head: Normocephalic, without obvious abnormality, atraumatic Eyes:  EOMI Lungs: clear to auscultation bilaterally Heart: regular rate and rhythm and S1, S2 normal Abdomen: normal findings: bowel sounds normal and soft, non-tender Extremities:  No edema Skin: mobility and turgor normal Neurologic: Grossly normal  Consultants:    Procedures:    Data Reviewed: I have personally reviewed following labs and imaging studies Results for orders placed or performed during the hospital encounter of 07/24/20 (from the past 24 hour(s))  Glucose, capillary     Status: Abnormal   Collection Time: 07/26/20  5:04 PM  Result Value Ref Range   Glucose-Capillary 146 (H) 70 - 99 mg/dL  Glucose, capillary     Status: Abnormal   Collection Time: 07/26/20  8:05 PM  Result Value Ref Range   Glucose-Capillary 107 (H) 70 - 99 mg/dL   Comment 1 Notify RN    Comment 2 Document in Chart   Glucose, capillary     Status: None   Collection Time: 07/27/20 12:13 AM  Result Value Ref Range   Glucose-Capillary 91 70 - 99 mg/dL   Comment 1 Notify RN    Comment 2 Document in Chart   Glucose, capillary     Status: None   Collection Time: 07/27/20  3:29 AM   Result Value Ref Range   Glucose-Capillary 76 70 - 99 mg/dL   Comment 1 Notify RN    Comment 2 Document in Chart   Glucose, capillary     Status: None   Collection Time: 07/27/20  7:27 AM  Result Value Ref Range   Glucose-Capillary 97 70 - 99 mg/dL  Glucose, capillary     Status: Abnormal   Collection Time: 07/27/20 11:57 AM  Result Value Ref Range   Glucose-Capillary 206 (H) 70 - 99 mg/dL    Recent Results (from the past 240 hour(s))  Resp Panel by RT-PCR (Flu A&B, Covid) Nasopharyngeal Swab     Status: Abnormal   Collection Time: 07/24/20 12:20 AM   Specimen: Nasopharyngeal Swab; Nasopharyngeal(NP) swabs in vial transport medium  Result Value Ref Range Status   SARS Coronavirus 2 by RT PCR POSITIVE (A) NEGATIVE Final    Comment: RESULT CALLED TO, READ BACK BY AND VERIFIED WITH: RN Legrand Pitts AT [id number] 07/24/20 CRUICKSHANK A (NOTE) SARS-CoV-2 target nucleic acids are DETECTED.  The SARS-CoV-2 RNA is generally detectable in upper respiratory specimens during the acute phase of infection. Positive results are indicative of the presence of the identified virus, but do not rule out bacterial infection or co-infection with other pathogens not detected by the test. Clinical correlation with patient history and other diagnostic information is necessary to determine patient infection status. The expected result is Negative.  Fact Sheet for Patients: BloggerCourse.com  Fact Sheet for Healthcare Providers: SeriousBroker.it  This test is not yet approved or cleared by the [place] FDA and  has been authorized for detection and/or diagnosis of SARS-CoV-2 by FDA under an Emergency Use Authorization (EUA).  This EUA will remain in effect (meaning this test  can be used) for the duration of  the COVID-19 declaration under Section 564(b)(1) of the Act, 21 U.S.C. section 360bbb-3(b)(1), unless the authorization is terminated or revoked  sooner.     Influenza A by PCR NEGATIVE NEGATIVE Final   Influenza B by PCR NEGATIVE NEGATIVE Final    Comment: (NOTE) The Xpert Xpress SARS-CoV-2/FLU/RSV plus assay is intended as an aid in the diagnosis of influenza from Nasopharyngeal swab specimens and should not be used as a sole basis for treatment. Nasal washings and aspirates are unacceptable for Xpert Xpress SARS-CoV-2/FLU/RSV testing.  Fact Sheet for Patients: BloggerCourse.com  Fact Sheet for Healthcare Providers: SeriousBroker.it  This test is not yet approved or cleared by the [place] FDA and has been authorized for detection and/or diagnosis of SARS-CoV-2 by FDA under an Emergency Use Authorization (EUA). This EUA will remain in effect (meaning this test can be used) for the duration of the COVID-19 declaration under Section 564(b)(1) of the Act, 21 U.S.C. section 360bbb-3(b)(1), unless the authorization is terminated or revoked.  Performed at [facility], [address] [place], [place] [address]      Radiology Studies: No results found. No orders to display  Scheduled Meds:  atenolol  25 mg Oral BID   dronabinol  5 mg Oral BID AC   enoxaparin (LOVENOX) injection  40 mg Subcutaneous Q24H   erythromycin  250 mg Oral Daily   insulin aspart  0-15 Units Subcutaneous TID WC   insulin glargine  15 Units Subcutaneous Daily   metoCLOPramide (REGLAN) injection  10 mg Intravenous Q8H   pantoprazole (PROTONIX) IV  40 mg Intravenous Q24H   PRN Meds: HYDROmorphone (DILAUDID) injection, hyoscyamine, levalbuterol, metoprolol tartrate, promethazine Continuous Infusions:  sodium chloride 100 mL/hr at 07/27/20 0525     LOS: 2 days  Time spent: Greater than 50% of the 35 minute visit was spent in counseling/coordination of care for the patient as laid out in the A&P.   Lewie Chamber, MD [facility] 07/27/2020, 2:29 PM

## 2020-07-28 ENCOUNTER — Encounter: Payer: Self-pay | Admitting: Internal Medicine

## 2020-07-28 LAB — GLUCOSE, CAPILLARY
Glucose-Capillary: 230 mg/dL — ABNORMAL HIGH (ref 70–99)
Glucose-Capillary: 256 mg/dL — ABNORMAL HIGH (ref 70–99)

## 2020-07-28 NOTE — Discharge Summary (Signed)
Physician Discharge Summary   Kelli Hudson VVO:[id number] DOB: October 31, 1990 DOA: 07/24/2020  PCP: [name], MD  Admit date: 07/24/2020 Discharge date: 07/28/2020   Admitted From: home Disposition:  home Discharging physician: Dwyane Dee, MD  Recommendations for Outpatient Follow-up:  Mother asks to be contacted in the future prior to major decisions if patient allows  Home Health:  Equipment/Devices:   Patient discharged to home in Discharge Condition: stable Risk of unplanned readmission score: Unplanned Admission- Pilot do not use: 32.72  CODE STATUS: Full Diet recommendation:  Diet Orders (From admission, onward)     Start     Ordered   07/28/20 0000  Diet Carb Modified        07/28/20 0914   07/27/20 0846  Diet Carb Modified Fluid consistency: Thin; Room service appropriate? Yes  Diet effective now       Question Answer Comment  Diet-HS Snack? Nothing   Calorie Level Medium 1600-2000   Fluid consistency: Thin   Room service appropriate? Yes      07/27/20 0846            Hospital Course: Kelli Hudson is a 30 y.o. female with medical history significant of DMt1, gastroparesis, HTN. Presenting with N/V abdominal pain. She reports her symptoms started 3 days ago. She was vomiting despite her regular gastroparesis regimen. She had increasing abdominal soreness with the vomiting . She was recently discharged on 07/24/2020 but due to lingering symptoms and unable to tolerate oral nutrition at home, she presented back to the ER.   Assessment & Plan: DM type 1 w/ gastroparesis, uncontrolled Intractable N/V Chronic abdominal pain -Advanced diet to carb consistent and patient tolerated well - Continue erythromycin, Reglan   Dehydration AKI Hyperbilirubinemia     -Improved with fluids   HTN     - PRN metoprolol, resume home meds as able   Normocytic anemia     - no evidence of bleed   Recent COVID+ test     - positive in April; reports that  she had treatment     - she is outside the 21 isolation period and doesn't have any respiratory symptoms, d/c isolation    The patient's chronic medical conditions were treated accordingly per the patient's home medication regimen except as noted.  On day of discharge, patient was felt deemed stable for discharge. Patient/family member advised to call PCP or come back to ER if needed.   Principal Diagnosis: Gastroparesis  Discharge Diagnoses: Active Hospital Problems   Diagnosis Date Noted   Gastroparesis    AKI (acute kidney injury) ([place]) 07/25/2020    Resolved Hospital Problems  No resolved problems to display.    Discharge Instructions     Diet Carb Modified   Complete by: As directed    Increase activity slowly   Complete by: As directed       Allergies as of 07/28/2020   No Known Allergies      Medication List     TAKE these medications    atenolol 25 MG tablet Commonly known as: TENORMIN Take 1 tablet (25 mg total) by mouth 2 (two) times daily.   erythromycin 250 MG EC tablet Commonly known as: ERY-TAB Take 250 mg by mouth daily.   hydrochlorothiazide 25 MG tablet Commonly known as: HYDRODIURIL Take 1 tablet (25 mg total) by mouth daily.   hyoscyamine 0.375 MG 12 hr tablet Commonly known as: LEVBID Take 1 tablet (0.375 mg total) by mouth every 12 (twelve) hours as  needed for cramping (abdominal pain).   Insulin Pen Needle 31G X 5 MM Misc 1 Device by Does not apply route QID. For use with insulin pens   insulin regular 100 units/mL injection Commonly known as: NovoLIN R Inject 0-0.15 mLs (0-15 Units total) into the skin 3 (three) times daily before meals. CBG 70 - 120: 0 units CBG 121 - 150: 2 units CBG 151 - 200: 3 units CBG 201 - 250: 5 units CBG 251 - 300: 8 units CBG 301 - 350: 11 units CBG 351 - 400: 15 units   levalbuterol 45 MCG/ACT inhaler Commonly known as: XOPENEX HFA Inhale 2 puffs into the lungs every 6 (six) hours as needed for  wheezing or shortness of breath.   lisinopril 10 MG tablet Commonly known as: ZESTRIL Take 1 tablet (10 mg total) by mouth daily.   metoCLOPramide 5 MG tablet Commonly known as: Reglan Take 1 tablet (5 mg total) by mouth 3 (three) times daily before meals.   multivitamin with minerals Tabs tablet Take 1 tablet by mouth daily.   ondansetron 4 MG disintegrating tablet Commonly known as: Zofran ODT Take 1 tablet (4 mg total) by mouth every 6 (six) hours as needed for nausea or vomiting.   pantoprazole 40 MG tablet Commonly known as: PROTONIX Take 1 tablet (40 mg total) by mouth daily.   promethazine 12.5 MG tablet Commonly known as: PHENERGAN Take 1 tablet (12.5 mg total) by mouth every 6 (six) hours as needed for nausea or vomiting.   Tyler Aas FlexTouch 100 UNIT/ML FlexTouch Pen Generic drug: insulin degludec Inject 18 Units into the skin at bedtime.   True Metrix Blood Glucose Test test strip Generic drug: glucose blood Use as instructed   True Metrix Meter w/Device Kit 1 Device by Does not apply route 4 (four) times daily.   TRUEplus Lancets 28G Misc assist with checking blood sugar TID and qhs        Follow-up Information     [name], MD Follow up.   Specialty: Family Medicine Contact information: [place] [address] [phone]                No Known Allergies  Consultations:   Discharge Exam: BP 120/84   Pulse 85   Temp 98.1 F (36.7 C)   Resp 18   Ht 5' 8"  (1.727 m)   Wt 72.6 kg   LMP 05/25/2020 (Approximate)   SpO2 99%   BMI 24.33 kg/m  General appearance:  Less fatigued appearing and resting in bed comfortably Head: Normocephalic, without obvious abnormality, atraumatic Eyes:  EOMI Lungs: clear to auscultation bilaterally Heart: regular rate and rhythm and S1, S2 normal Abdomen: normal findings: bowel sounds normal and soft, non-tender Extremities:  No edema Skin: mobility and turgor  normal Neurologic: Grossly normal  The results of significant diagnostics from this hospitalization (including imaging, microbiology, ancillary and laboratory) are listed below for reference.   Microbiology: Recent Results (from the past 240 hour(s))  Resp Panel by RT-PCR (Flu A&B, Covid) Nasopharyngeal Swab     Status: Abnormal   Collection Time: 07/24/20 12:20 AM   Specimen: Nasopharyngeal Swab; Nasopharyngeal(NP) swabs in vial transport medium  Result Value Ref Range Status   SARS Coronavirus 2 by RT PCR POSITIVE (A) NEGATIVE Final    Comment: RESULT CALLED TO, READ BACK BY AND VERIFIED WITH: RN Rockne Menghini AT [phone] 07/24/20 CRUICKSHANK A (NOTE) SARS-CoV-2 target nucleic acids are DETECTED.  The SARS-CoV-2 RNA  is generally detectable in upper respiratory specimens during the acute phase of infection. Positive results are indicative of the presence of the identified virus, but do not rule out bacterial infection or co-infection with other pathogens not detected by the test. Clinical correlation with patient history and other diagnostic information is necessary to determine patient infection status. The expected result is Negative.  Fact Sheet for Patients: EntrepreneurPulse.com.au  Fact Sheet for Healthcare Providers: IncredibleEmployment.be  This test is not yet approved or cleared by the [place] FDA and  has been authorized for detection and/or diagnosis of SARS-CoV-2 by FDA under an Emergency Use Authorization (EUA).  This EUA will remain in effect (meaning this test  can be used) for the duration of  the COVID-19 declaration under Section 564(b)(1) of the Act, 21 U.S.C. section 360bbb-3(b)(1), unless the authorization is terminated or revoked sooner.     Influenza A by PCR NEGATIVE NEGATIVE Final   Influenza B by PCR NEGATIVE NEGATIVE Final    Comment: (NOTE) The Xpert Xpress SARS-CoV-2/FLU/RSV plus assay is intended as an aid in the  diagnosis of influenza from Nasopharyngeal swab specimens and should not be used as a sole basis for treatment. Nasal washings and aspirates are unacceptable for Xpert Xpress SARS-CoV-2/FLU/RSV testing.  Fact Sheet for Patients: EntrepreneurPulse.com.au  Fact Sheet for Healthcare Providers: IncredibleEmployment.be  This test is not yet approved or cleared by the [place] FDA and has been authorized for detection and/or diagnosis of SARS-CoV-2 by FDA under an Emergency Use Authorization (EUA). This EUA will remain in effect (meaning this test can be used) for the duration of the COVID-19 declaration under Section 564(b)(1) of the Act, 21 U.S.C. section 360bbb-3(b)(1), unless the authorization is terminated or revoked.  Performed at [facility], [place] [address], [place], [place] [address]      Labs: BNP (last 3 results) No results for input(s): BNP in the last 8760 hours. Basic Metabolic Panel: Recent Labs  Lab 07/23/20 1646 07/24/20 0450 07/24/20 2035 07/25/20 0928 07/26/20 0538  NA 133* 136 134* 135 137  K 4.7 3.9 3.5 4.0 3.8  CL 98 102 100 105 104  CO2 22 20* 20* 20* 24  GLUCOSE 428* 260* 398* 219* 215*  BUN 26* 23* 28* 18 14  CREATININE 1.10* 0.88 1.32* 0.79 0.70  CALCIUM 10.3 9.7 9.8 8.6* 9.6  MG  --   --   --  1.6*  --    Liver Function Tests: Recent Labs  Lab 07/23/20 1646 07/24/20 2035 07/25/20 0928 07/26/20 0538  AST 30 34 30 24  ALT 32 27 24 22   ALKPHOS 82 75 62 66  BILITOT 1.4* 1.5* 1.4* 1.2  PROT 8.8* 7.9 6.6 6.8  ALBUMIN 4.5 3.9 3.2* 3.2*   Recent Labs  Lab 07/23/20 1646 07/24/20 2035  LIPASE 24 25   No results for input(s): AMMONIA in the last 168 hours. CBC: Recent Labs  Lab 07/23/20 1646 07/24/20 2035 07/25/20 0928 07/26/20 0538  WBC 8.1 14.6* 14.9* 12.9*  NEUTROABS  --  12.1* 11.5*  --   HGB 12.9 11.8* 10.4* 11.5*  HCT 37.6 35.0* 31.0* 34.5*  MCV 86.4 89.1 87.6 89.1   PLT 348 345 304 308   Cardiac Enzymes: No results for input(s): CKTOTAL, CKMB, CKMBINDEX, TROPONINI in the last 168 hours. BNP: Invalid input(s): POCBNP CBG: Recent Labs  Lab 07/27/20 1157 07/27/20 1650 07/27/20 2357 07/28/20 0357 07/28/20 0732  GLUCAP 206* 161* 257* 230* 256*   D-Dimer No results  for input(s): DDIMER in the last 72 hours. Hgb A1c No results for input(s): HGBA1C in the last 72 hours. Lipid Profile No results for input(s): CHOL, HDL, LDLCALC, TRIG, CHOLHDL, LDLDIRECT in the last 72 hours. Thyroid function studies No results for input(s): TSH, T4TOTAL, T3FREE, THYROIDAB in the last 72 hours.  Invalid input(s): FREET3 Anemia work up No results for input(s): VITAMINB12, FOLATE, FERRITIN, TIBC, IRON, RETICCTPCT in the last 72 hours. Urinalysis    Component Value Date/Time   COLORURINE YELLOW 07/24/2020 2005   APPEARANCEUR CLEAR 07/24/2020 2005   LABSPEC 1.022 07/24/2020 2005   PHURINE 5.0 07/24/2020 2005   GLUCOSEU >=500 (A) 07/24/2020 2005   GLUCOSEU NEGATIVE 05/22/2020 1417   HGBUR SMALL (A) 07/24/2020 2005   BILIRUBINUR NEGATIVE 07/24/2020 2005   KETONESUR 80 (A) 07/24/2020 2005   PROTEINUR 100 (A) 07/24/2020 2005   UROBILINOGEN 0.2 05/22/2020 1417   NITRITE NEGATIVE 07/24/2020 2005   LEUKOCYTESUR NEGATIVE 07/24/2020 2005   Sepsis Labs Invalid input(s): PROCALCITONIN,  WBC,  LACTICIDVEN Microbiology Recent Results (from the past 240 hour(s))  Resp Panel by RT-PCR (Flu A&B, Covid) Nasopharyngeal Swab     Status: Abnormal   Collection Time: 07/24/20 12:20 AM   Specimen: Nasopharyngeal Swab; Nasopharyngeal(NP) swabs in vial transport medium  Result Value Ref Range Status   SARS Coronavirus 2 by RT PCR POSITIVE (A) NEGATIVE Final    Comment: RESULT CALLED TO, READ BACK BY AND VERIFIED WITH: RN Rockne Menghini AT [phone] 07/24/20 CRUICKSHANK A (NOTE) SARS-CoV-2 target nucleic acids are DETECTED.  The SARS-CoV-2 RNA is generally detectable in upper  respiratory specimens during the acute phase of infection. Positive results are indicative of the presence of the identified virus, but do not rule out bacterial infection or co-infection with other pathogens not detected by the test. Clinical correlation with patient history and other diagnostic information is necessary to determine patient infection status. The expected result is Negative.  Fact Sheet for Patients: EntrepreneurPulse.com.au  Fact Sheet for Healthcare Providers: IncredibleEmployment.be  This test is not yet approved or cleared by the [place] FDA and  has been authorized for detection and/or diagnosis of SARS-CoV-2 by FDA under an Emergency Use Authorization (EUA).  This EUA will remain in effect (meaning this test  can be used) for the duration of  the COVID-19 declaration under Section 564(b)(1) of the Act, 21 U.S.C. section 360bbb-3(b)(1), unless the authorization is terminated or revoked sooner.     Influenza A by PCR NEGATIVE NEGATIVE Final   Influenza B by PCR NEGATIVE NEGATIVE Final    Comment: (NOTE) The Xpert Xpress SARS-CoV-2/FLU/RSV plus assay is intended as an aid in the diagnosis of influenza from Nasopharyngeal swab specimens and should not be used as a sole basis for treatment. Nasal washings and aspirates are unacceptable for Xpert Xpress SARS-CoV-2/FLU/RSV testing.  Fact Sheet for Patients: EntrepreneurPulse.com.au  Fact Sheet for Healthcare Providers: IncredibleEmployment.be  This test is not yet approved or cleared by the [place] FDA and has been authorized for detection and/or diagnosis of SARS-CoV-2 by FDA under an Emergency Use Authorization (EUA). This EUA will remain in effect (meaning this test can be used) for the duration of the COVID-19 declaration under Section 564(b)(1) of the Act, 21 U.S.C. section 360bbb-3(b)(1), unless the authorization is  terminated or revoked.  Performed at [facility], [place] [address] [place], [place] [address]     Procedures/Studies: No results found.   Time coordinating discharge: Over 30 minutes    Dwyane Dee,  MD  [facility] 07/28/2020, 3:01 PM

## 2020-07-28 NOTE — Progress Notes (Signed)
Lewie Chamber, MD [facility] 07/28/2020, 9:59 AM

## 2020-07-31 ENCOUNTER — Telehealth: Payer: Self-pay | Admitting: *Deleted

## 2020-07-31 NOTE — Telephone Encounter (Signed)
Transition Care Management Unsuccessful Follow-up Telephone Call  Date of discharge and from where:  [facility]  Attempts:  1st Attempt  Reason for unsuccessful TCM follow-up call:  Left voice message   Spoke with patient disconnected/ tried back no answer.

## 2020-08-12 ENCOUNTER — Other Ambulatory Visit: Payer: Self-pay

## 2020-08-12 ENCOUNTER — Inpatient Hospital Stay (HOSPITAL_COMMUNITY)
Admission: EM | Admit: 2020-08-12 | Discharge: 2020-08-15 | DRG: 074 | Disposition: A | Discharge status: Home / Self Care | Payer: 59 | Attending: Internal Medicine | Admitting: Internal Medicine

## 2020-08-12 ENCOUNTER — Encounter (HOSPITAL_COMMUNITY): Payer: Self-pay

## 2020-08-12 DIAGNOSIS — Z8041 Family history of malignant neoplasm of ovary: Secondary | ICD-10-CM

## 2020-08-12 DIAGNOSIS — Z89422 Acquired absence of other left toe(s): Secondary | ICD-10-CM

## 2020-08-12 DIAGNOSIS — Z79899 Other long term (current) drug therapy: Secondary | ICD-10-CM

## 2020-08-12 DIAGNOSIS — K3184 Gastroparesis: Secondary | ICD-10-CM | POA: Diagnosis not present

## 2020-08-12 DIAGNOSIS — R1013 Epigastric pain: Secondary | ICD-10-CM

## 2020-08-12 DIAGNOSIS — E1065 Type 1 diabetes mellitus with hyperglycemia: Secondary | ICD-10-CM | POA: Diagnosis present

## 2020-08-12 DIAGNOSIS — Z800 Family history of malignant neoplasm of digestive organs: Secondary | ICD-10-CM

## 2020-08-12 DIAGNOSIS — K219 Gastro-esophageal reflux disease without esophagitis: Secondary | ICD-10-CM | POA: Diagnosis present

## 2020-08-12 DIAGNOSIS — Z794 Long term (current) use of insulin: Secondary | ICD-10-CM

## 2020-08-12 DIAGNOSIS — Z20822 Contact with and (suspected) exposure to covid-19: Secondary | ICD-10-CM | POA: Diagnosis present

## 2020-08-12 DIAGNOSIS — E1043 Type 1 diabetes mellitus with diabetic autonomic (poly)neuropathy: Principal | ICD-10-CM | POA: Diagnosis present

## 2020-08-12 DIAGNOSIS — Z818 Family history of other mental and behavioral disorders: Secondary | ICD-10-CM

## 2020-08-12 DIAGNOSIS — Z833 Family history of diabetes mellitus: Secondary | ICD-10-CM

## 2020-08-12 DIAGNOSIS — I10 Essential (primary) hypertension: Secondary | ICD-10-CM | POA: Diagnosis present

## 2020-08-12 DIAGNOSIS — Z801 Family history of malignant neoplasm of trachea, bronchus and lung: Secondary | ICD-10-CM

## 2020-08-12 DIAGNOSIS — Z803 Family history of malignant neoplasm of breast: Secondary | ICD-10-CM

## 2020-08-12 LAB — LIPASE, BLOOD: Lipase: 21 U/L (ref 11–51)

## 2020-08-12 LAB — CBC
HCT: 34.8 % — ABNORMAL LOW (ref 36.0–46.0)
Hemoglobin: 11.7 g/dL — ABNORMAL LOW (ref 12.0–15.0)
MCH: 29.8 pg (ref 26.0–34.0)
MCHC: 33.6 g/dL (ref 30.0–36.0)
MCV: 88.5 fL (ref 80.0–100.0)
Platelets: 397 10*3/uL (ref 150–400)
RBC: 3.93 MIL/uL (ref 3.87–5.11)
RDW: 13.1 % (ref 11.5–15.5)
WBC: 8.5 10*3/uL (ref 4.0–10.5)
nRBC: 0 % (ref 0.0–0.2)

## 2020-08-12 LAB — RESP PANEL BY RT-PCR (FLU A&B, COVID) ARPGX2
Influenza A by PCR: NEGATIVE
Influenza B by PCR: NEGATIVE
SARS Coronavirus 2 by RT PCR: NEGATIVE

## 2020-08-12 LAB — COMPREHENSIVE METABOLIC PANEL
ALT: 26 U/L (ref 0–44)
AST: 23 U/L (ref 15–41)
Albumin: 4 g/dL (ref 3.5–5.0)
Alkaline Phosphatase: 72 U/L (ref 38–126)
Anion gap: 14 (ref 5–15)
BUN: 22 mg/dL — ABNORMAL HIGH (ref 6–20)
CO2: 22 mmol/L (ref 22–32)
Calcium: 9.8 mg/dL (ref 8.9–10.3)
Chloride: 101 mmol/L (ref 98–111)
Creatinine, Ser: 0.96 mg/dL (ref 0.44–1.00)
GFR, Estimated: >60 mL/min (ref >60)
Glucose, Bld: 319 mg/dL — ABNORMAL HIGH (ref 70–99)
Potassium: 3.7 mmol/L (ref 3.5–5.1)
Sodium: 137 mmol/L (ref 135–145)
Total Bilirubin: 1.1 mg/dL (ref 0.3–1.2)
Total Protein: 7.9 g/dL (ref 6.5–8.1)

## 2020-08-12 LAB — CBG MONITORING, ED: Glucose-Capillary: 252 mg/dL — ABNORMAL HIGH (ref 70–99)

## 2020-08-12 LAB — I-STAT BETA HCG BLOOD, ED (MC, WL, AP ONLY): I-stat hCG, quantitative: <5 m[IU]/mL (ref <5)

## 2020-08-12 LAB — BLOOD GAS, VENOUS
Acid-base deficit: 1.5 mmol/L (ref 0.0–2.0)
Bicarbonate: 22.6 mmol/L (ref 20.0–28.0)
O2 Saturation: 72.5 %
Patient temperature: 98.6
pCO2, Ven: 37.8 mmHg — ABNORMAL LOW (ref 44.0–60.0)
pH, Ven: 7.394 (ref 7.250–7.430)
pO2, Ven: 40.7 mmHg (ref 32.0–45.0)

## 2020-08-12 MED ORDER — PANTOPRAZOLE SODIUM 40 MG PO TBEC
40.0000 mg | DELAYED_RELEASE_TABLET | Freq: Every day | ORAL | Status: DC
  Administered 2020-08-13 – 2020-08-14 (×2): 40 mg via ORAL
  Filled 2020-08-12 (×2): qty 1

## 2020-08-12 MED ORDER — METOCLOPRAMIDE HCL 5 MG/ML IJ SOLN
10.0000 mg | Freq: Once | INTRAMUSCULAR | Status: AC
  Administered 2020-08-12: 10 mg via INTRAVENOUS
  Filled 2020-08-12: qty 2

## 2020-08-12 MED ORDER — PROMETHAZINE HCL 25 MG PO TABS: 12.5000 mg | ORAL_TABLET | Freq: Four times a day (QID) | ORAL | Status: DC | PRN

## 2020-08-12 MED ORDER — SODIUM CHLORIDE 0.9 % IV BOLUS
1000.0000 mL | Freq: Once | INTRAVENOUS | Status: AC
  Administered 2020-08-12: 1000 mL via INTRAVENOUS

## 2020-08-12 MED ORDER — KETOROLAC TROMETHAMINE 30 MG/ML IJ SOLN
30.0000 mg | Freq: Four times a day (QID) | INTRAMUSCULAR | Status: DC | PRN
  Administered 2020-08-13 – 2020-08-14 (×4): 30 mg via INTRAVENOUS
  Filled 2020-08-12 (×5): qty 1

## 2020-08-12 MED ORDER — HYDROMORPHONE HCL 1 MG/ML IJ SOLN
1.0000 mg | Freq: Once | INTRAMUSCULAR | Status: AC
  Administered 2020-08-12: 1 mg via INTRAVENOUS
  Filled 2020-08-12: qty 1

## 2020-08-12 MED ORDER — SODIUM CHLORIDE 0.9 % IV SOLN
250.0000 mg | Freq: Three times a day (TID) | INTRAVENOUS | Status: DC
  Administered 2020-08-13 – 2020-08-15 (×8): 250 mg via INTRAVENOUS
  Filled 2020-08-12 (×11): qty 5

## 2020-08-12 MED ORDER — LABETALOL HCL 5 MG/ML IV SOLN
10.0000 mg | INTRAVENOUS | Status: DC | PRN
  Administered 2020-08-13 (×2): 10 mg via INTRAVENOUS
  Filled 2020-08-12 (×2): qty 4

## 2020-08-12 MED ORDER — LACTATED RINGERS IV SOLN: INTRAVENOUS | Status: DC

## 2020-08-12 MED ORDER — INSULIN ASPART 100 UNIT/ML IJ SOLN
0.0000 [IU] | INTRAMUSCULAR | Status: DC
  Administered 2020-08-13: 5 [IU] via SUBCUTANEOUS
  Administered 2020-08-13: 3 [IU] via SUBCUTANEOUS
  Administered 2020-08-13 (×2): 5 [IU] via SUBCUTANEOUS
  Administered 2020-08-13: 9 [IU] via SUBCUTANEOUS
  Administered 2020-08-13: 1 [IU] via SUBCUTANEOUS
  Administered 2020-08-14: 5 [IU] via SUBCUTANEOUS
  Administered 2020-08-14: 2 [IU] via SUBCUTANEOUS
  Administered 2020-08-14: 3 [IU] via SUBCUTANEOUS
  Administered 2020-08-14: 2 [IU] via SUBCUTANEOUS
  Administered 2020-08-14 – 2020-08-15 (×3): 1 [IU] via SUBCUTANEOUS
  Filled 2020-08-12: qty 0.09

## 2020-08-12 MED ORDER — LORAZEPAM 2 MG/ML IJ SOLN: 1.0000 mg | Freq: Four times a day (QID) | INTRAMUSCULAR | Status: DC | PRN

## 2020-08-12 MED ORDER — ONDANSETRON HCL 4 MG PO TABS
4.0000 mg | ORAL_TABLET | Freq: Four times a day (QID) | ORAL | Status: DC | PRN
Start: 2020-08-12 — End: 2020-08-15

## 2020-08-12 MED ORDER — ATENOLOL 25 MG PO TABS
25.0000 mg | ORAL_TABLET | Freq: Two times a day (BID) | ORAL | Status: DC
  Administered 2020-08-12 – 2020-08-14 (×5): 25 mg via ORAL
  Filled 2020-08-12 (×5): qty 1

## 2020-08-12 MED ORDER — LISINOPRIL 10 MG PO TABS
10.0000 mg | ORAL_TABLET | Freq: Every day | ORAL | Status: DC
  Administered 2020-08-13 – 2020-08-14 (×2): 10 mg via ORAL
  Filled 2020-08-12 (×2): qty 1

## 2020-08-12 MED ORDER — ACETAMINOPHEN 650 MG RE SUPP: 650.0000 mg | Freq: Four times a day (QID) | RECTAL | Status: DC | PRN

## 2020-08-12 MED ORDER — LEVALBUTEROL TARTRATE 45 MCG/ACT IN AERO: 2.0000 | INHALATION_SPRAY | Freq: Four times a day (QID) | RESPIRATORY_TRACT | Status: DC | PRN

## 2020-08-12 MED ORDER — ACETAMINOPHEN 325 MG PO TABS: 650.0000 mg | ORAL_TABLET | Freq: Four times a day (QID) | ORAL | Status: DC | PRN

## 2020-08-12 MED ORDER — ADULT MULTIVITAMIN W/MINERALS CH
1.0000 | ORAL_TABLET | Freq: Every day | ORAL | Status: DC
  Administered 2020-08-13 – 2020-08-14 (×2): 1 via ORAL
  Filled 2020-08-12 (×2): qty 1

## 2020-08-12 MED ORDER — ENOXAPARIN SODIUM 40 MG/0.4ML IJ SOSY
40.0000 mg | PREFILLED_SYRINGE | INTRAMUSCULAR | Status: DC
  Filled 2020-08-12 (×2): qty 0.4

## 2020-08-12 MED ORDER — ONDANSETRON HCL 4 MG/2ML IJ SOLN
4.0000 mg | Freq: Four times a day (QID) | INTRAMUSCULAR | Status: DC | PRN
  Administered 2020-08-13: 4 mg via INTRAVENOUS
  Filled 2020-08-12: qty 2

## 2020-08-12 MED ORDER — METOCLOPRAMIDE HCL 5 MG/ML IJ SOLN
10.0000 mg | Freq: Three times a day (TID) | INTRAMUSCULAR | Status: DC
  Administered 2020-08-13 – 2020-08-15 (×7): 10 mg via INTRAVENOUS
  Filled 2020-08-12 (×8): qty 2

## 2020-08-12 MED ORDER — INSULIN GLARGINE 100 UNIT/ML ~~LOC~~ SOLN
15.0000 [IU] | Freq: Every day | SUBCUTANEOUS | Status: DC
  Administered 2020-08-13: 15 [IU] via SUBCUTANEOUS
  Filled 2020-08-12 (×2): qty 0.15

## 2020-08-12 NOTE — ED Notes (Signed)
Pt difficult IV stick, charge nurse aware.  IV team consult placed stat for blood draw and to insert piv.

## 2020-08-12 NOTE — H&P (Signed)
History and Physical    Kelli Hudson ZOX:[id number] DOB: 1990/07/29 DOA: 08/12/2020  PCP: [name], MD  Patient coming from: Home  I have personally briefly reviewed patient's old medical records in [place]  Chief Complaint: Gastroparesis  HPI: Kelli Hudson is a 30 y.o. female with medical history significant of DM1, HTN, frequent admissions for diabetic gastroparesis, most recently earlier this month.  Pt presents to ED with N/V and abd pain.  Symptoms onset 3 days ago, symptoms constant, symptoms occurred despite taking her usual gastroparesis PO regimen.  Presents to ED for ongoing symptoms.  Symptoms severe.  No fevers, chills, SOB, CP.  ED Course: N/V continues despite reglan, dilaudid in ED.   Review of Systems: As per HPI, otherwise all review of systems negative.  Past Medical History:  Diagnosis Date   Acute H. pylori gastric ulcer    Diabetes mellitus ([place])    Diabetic gastroparesis ([place])    Gastroparesis    GERD (gastroesophageal reflux disease)    Hypertension    Hyperthyroidism     Past Surgical History:  Procedure Laterality Date   AMPUTATION TOE Left 03/10/2018   Procedure: AMPUTATION FIFTH TOE;  Surgeon: Trula Slade, DPM;  Location: [facility];  Service: Podiatry;  Laterality: Left;   BIOPSY  01/28/2020   Procedure: BIOPSY;  Surgeon: Otis Brace, MD;  Location: [facility] ENDOSCOPY;  Service: Gastroenterology;;   ESOPHAGOGASTRODUODENOSCOPY N/A 01/28/2020   Procedure: ESOPHAGOGASTRODUODENOSCOPY (EGD);  Surgeon: Otis Brace, MD;  Location: [facility];  Service: Gastroenterology;  Laterality: N/A;   ESOPHAGOGASTRODUODENOSCOPY (EGD) WITH PROPOFOL Left 09/08/2015   Procedure: ESOPHAGOGASTRODUODENOSCOPY (EGD) WITH PROPOFOL;  Surgeon: Arta Silence, MD;  [facility];  Service: Endoscopy;  Laterality: Left;   WISDOM TOOTH EXTRACTION       reports that she has never smoked. She has never used  smokeless tobacco. She reports current alcohol use of about 1.0 standard drink of alcohol per week. She reports that she does not use drugs.  No Known Allergies  Family History  Problem Relation Age of Onset   Lung cancer Mother    Diabetes Mother    Bipolar disorder Father    Liver cancer Maternal Grandfather    Breast cancer Other        maternal great aunt   Heart disease Other    Ovarian cancer Other        maternal great aunt   Pancreatic cancer Maternal Uncle    Prostate cancer Maternal Uncle    Diabetes Maternal Aunt        x 2   Kidney disease Other        maternal great aunt     Prior to Admission medications   Medication Sig Start Date End Date Taking? Authorizing Provider  atenolol (TENORMIN) 25 MG tablet Take 1 tablet (25 mg total) by mouth 2 (two) times daily. 01/30/20  Yes Mariel Aloe, MD  erythromycin (E-MYCIN) 250 MG tablet Take 250 mg by mouth 2 (two) times daily. 08/10/20  Yes [provider]  hydrochlorothiazide (HYDRODIURIL) 25 MG tablet Take 1 tablet (25 mg total) by mouth daily. 06/14/20  Yes [name], MD  hyoscyamine (LEVBID) 0.375 MG 12 hr tablet Take 1 tablet (0.375 mg total) by mouth every 12 (twelve) hours as needed for cramping (abdominal pain). 05/14/20  Yes Sheikh, Omair Latif, DO  insulin regular (NOVOLIN R) 100 units/mL injection Inject 0-0.15 mLs (0-15 Units total) into the skin 3 (three) times daily  before meals. CBG 70 - 120: 0 units CBG 121 - 150: 2 units CBG 151 - 200: 3 units CBG 201 - 250: 5 units CBG 251 - 300: 8 units CBG 301 - 350: 11 units CBG 351 - 400: 15 units 05/14/20  Yes Sheikh, Omair Latif, DO  levalbuterol [facility] HFA) 45 MCG/ACT inhaler Inhale 2 puffs into the lungs every 6 (six) hours as needed for wheezing or shortness of breath. 05/28/20  Yes Little Ishikawa, MD  lisinopril (ZESTRIL) 10 MG tablet Take 1 tablet (10 mg total) by mouth daily. 06/10/20  Yes Cherene Altes, MD  metoCLOPramide (REGLAN) 5  MG tablet Take 1 tablet (5 mg total) by mouth 3 (three) times daily before meals. 05/21/20  Yes Esterwood, Amy S, PA-C  Multiple Vitamin (MULTIVITAMIN WITH MINERALS) TABS tablet Take 1 tablet by mouth daily.   Yes [provider]  ondansetron (ZOFRAN ODT) 4 MG disintegrating tablet Take 1 tablet (4 mg total) by mouth every 6 (six) hours as needed for nausea or vomiting. 04/20/20  Yes Esterwood, Amy S, PA-C  pantoprazole (PROTONIX) 40 MG tablet Take 1 tablet (40 mg total) by mouth daily. 07/24/20  Yes Dwyane Dee, MD  promethazine (PHENERGAN) 12.5 MG tablet Take 1 tablet (12.5 mg total) by mouth every 6 (six) hours as needed for nausea or vomiting. 05/14/20  Yes Sheikh, Omair Latif, DO  Blood Glucose Monitoring Suppl (TRUE METRIX METER) w/Device KIT 1 Device by Does not apply route 4 (four) times daily. 07/05/19   Libby Maw, MD  glucose blood (TRUE METRIX BLOOD GLUCOSE TEST) test strip Use as instructed 07/05/19   Libby Maw, MD  Insulin Pen Needle 31G X 5 MM MISC 1 Device by Does not apply route QID. For use with insulin pens 07/05/19   Libby Maw, MD  TRUEPLUS LANCETS 28G MISC assist with checking blood sugar TID and qhs 09/10/15   Zettie Pho S, PA-C  insulin aspart (NOVOLOG) 100 UNIT/ML FlexPen Inject 5 Units into the skin 3 (three) times daily with meals. 02/23/18 07/05/19  Donne Hazel, MD    Physical Exam: Vitals:   08/12/20 2030 08/12/20 2100 08/12/20 2200 08/12/20 2300  BP: (!) 181/107 (!) 160/101 (!) 158/101 (!) 181/106  Pulse: (!) 127 (!) 127 (!) 126 (!) 123  Resp:  18 18 20   Temp:      TempSrc:      SpO2: 100% 100% 100% 100%  Weight:      Height:        Constitutional: Uncomfortable appearing in bed. Eyes: PERRL, lids and conjunctivae normal ENMT: Mucous membranes are moist. Posterior pharynx clear of any exudate or lesions.Normal dentition.  Neck: normal, supple, no masses, no thyromegaly Respiratory: clear to auscultation bilaterally,  no wheezing, no crackles. Normal respiratory effort. No accessory muscle use.  Cardiovascular: Regular rate and rhythm, no murmurs / rubs / gallops. No extremity edema. 2+ pedal pulses. No carotid bruits.  Abdomen: Generalized TTP Musculoskeletal: no clubbing / cyanosis. No joint deformity upper and lower extremities. Good ROM, no contractures. Normal muscle tone.  Skin: no rashes, lesions, ulcers. No induration Neurologic: CN 2-12 grossly intact. Sensation intact, DTR normal. Strength 5/5 in all 4.  Psychiatric: Normal judgment and insight. Alert and oriented x 3. Normal mood.    Labs on Admission: I have personally reviewed following labs and imaging studies  CBC: Recent Labs  Lab 08/12/20 1848  WBC 8.5  HGB 11.7*  HCT 34.8*  MCV 88.5  PLT 889   Basic Metabolic Panel: Recent Labs  Lab 08/12/20 1848  NA 137  K 3.7  CL 101  CO2 22  GLUCOSE 319*  BUN 22*  CREATININE 0.96  CALCIUM 9.8   GFR: Estimated Creatinine Clearance: 87.2 mL/min (by C-G formula based on SCr of 0.96 mg/dL). Liver Function Tests: Recent Labs  Lab 08/12/20 1848  AST 23  ALT 26  ALKPHOS 72  BILITOT 1.1  PROT 7.9  ALBUMIN 4.0   Recent Labs  Lab 08/12/20 1848  LIPASE 21   No results for input(s): AMMONIA in the last 168 hours. Coagulation Profile: No results for input(s): INR, PROTIME in the last 168 hours. Cardiac Enzymes: No results for input(s): CKTOTAL, CKMB, CKMBINDEX, TROPONINI in the last 168 hours. BNP (last 3 results) No results for input(s): PROBNP in the last 8760 hours. HbA1C: No results for input(s): HGBA1C in the last 72 hours. CBG: Recent Labs  Lab 08/12/20 1850  GLUCAP 252*   Lipid Profile: No results for input(s): CHOL, HDL, LDLCALC, TRIG, CHOLHDL, LDLDIRECT in the last 72 hours. Thyroid Function Tests: No results for input(s): TSH, T4TOTAL, FREET4, T3FREE, THYROIDAB in the last 72 hours. Anemia Panel: No results for input(s): VITAMINB12, FOLATE, FERRITIN, TIBC,  IRON, RETICCTPCT in the last 72 hours. Urine analysis:    Component Value Date/Time   COLORURINE YELLOW 07/24/2020 2005   APPEARANCEUR CLEAR 07/24/2020 2005   LABSPEC 1.022 07/24/2020 2005   PHURINE 5.0 07/24/2020 2005   GLUCOSEU >=500 (A) 07/24/2020 2005   GLUCOSEU NEGATIVE 05/22/2020 1417   HGBUR SMALL (A) 07/24/2020 2005   BILIRUBINUR NEGATIVE 07/24/2020 2005   KETONESUR 80 (A) 07/24/2020 2005   PROTEINUR 100 (A) 07/24/2020 2005   UROBILINOGEN 0.2 05/22/2020 1417   NITRITE NEGATIVE 07/24/2020 2005   LEUKOCYTESUR NEGATIVE 07/24/2020 2005    Radiological Exams on Admission: No results found.  EKG: Independently reviewed.  Assessment/Plan Active Problems:   Diabetic gastroparesis associated with type 1 diabetes mellitus ([place])    Gastroparesis - IV reglan scheduled IV erythromycin scheduled NPO IVF Avoid narcotics as these will worsen GI motility (discussed this with patient). Tordol if needed for pain Ativan if needed for breakthrough nausea DM1 - Lantus 15 daily Sensitive SSI Q4H HTN - Cont home BP meds Adding PRN labetalol  DVT prophylaxis: Lovenox Code Status: Full Family Communication: No family in room Disposition Plan: Home after taking POs again Consults called: None Admission status: Place in 12    Kazi Reppond, [place] Hospitalists  How to contact the [facility] Attending or Consulting provider [place] or covering provider during after hours [place], for this patient?  Check the care team in [facility] and look for a) attending/consulting TRH provider listed and b) the [facility] H team listed Log into [email or web address]  Amion Physician Scheduling and messaging for groups and whole hospitals  On call and physician scheduling software for group practices, residents, hospitalists and other medical providers for call, clinic, rotation and shift schedules. OnCall [employer] is a hospital-wide system for scheduling doctors and paging doctors on call. EasyPlot is for  scientific plotting and data analysis.  [email or web address]  and use [place]'s universal password to access. If you do not have the password, please contact the hospital operator.  Locate the [facility] provider you are looking for under [facility] and page to a number that you can be directly reached. If you still have difficulty reaching the provider, please page the [facility] (Director on Call) for the Hospitalists listed  on amion for assistance.  08/12/2020, 11:18 PM

## 2020-08-12 NOTE — ED Notes (Signed)
Iv team at bedside  

## 2020-08-12 NOTE — ED Triage Notes (Addendum)
Patient ac/o mid abdominal pain and vomiting since this AM. Patient denies any diarrhea.  CBG-252 in triage.

## 2020-08-12 NOTE — ED Provider Notes (Signed)
[facility] DEPT Provider Note   CSN: [id number] Arrival date & time: 08/12/20  1757     History Chief Complaint  Patient presents with   Abdominal Pain   Emesis    Kelli Hudson is a 30 y.o. female.  She is a history of diabetes and gastroparesis.  She has multiple visits for gastroparesis.  Complaining of acute onset of epigastric abdominal pain along with nausea and multiple episodes of nonbloody vomitus.  No diarrhea no urinary symptoms.  No known fevers.  She does not know what triggered it.  She was admitted twice this month for same  The history is provided by the patient.  Abdominal Pain Pain location:  Epigastric Pain quality: aching   Pain severity:  Severe Onset quality:  Gradual Duration:  12 hours Timing:  Constant Progression:  Unchanged Chronicity:  Recurrent Context: not trauma   Relieved by:  None tried Worsened by:  Nothing Ineffective treatments:  None tried Associated symptoms: nausea and vomiting   Associated symptoms: no cough, no diarrhea, no dysuria, no fever, no hematemesis, no hematochezia and no hematuria   Emesis Associated symptoms: abdominal pain   Associated symptoms: no cough, no diarrhea, no fever and no headaches   Risk factors: diabetes       Past Medical History:  Diagnosis Date   Acute H. 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) 02/24/2020   Intractable nausea and vomiting    Intractable abdominal pain 01/24/2020   H. pylori infection 10/07/2019   Depression with anxiety 07/05/2019   Diabetic foot ulcer ([place]) 02/21/2018   Diabetic polyneuropathy associated with type 2 diabetes mellitus ([place]) 09/24/2017   Pain in right foot 09/24/2017   Gastroparesis    Leukocytosis    Increased frequency of urination 06/14/2011   UTI (urinary tract infection) 06/14/2011   Goiter 10/05/2009   Uncontrolled diabetes mellitus ([place]) 10/05/2009   Essential hypertension 10/05/2009    Past Surgical History:  Procedure Laterality Date   AMPUTATION TOE Left 03/10/2018   Procedure: AMPUTATION FIFTH TOE;  Surgeon: Trula Slade, DPM;  Location: [facility];  Service: Podiatry;  Laterality: Left;   BIOPSY  01/28/2020   Procedure: BIOPSY;  Surgeon: Otis Brace, MD;  Location: [facility]OSCOPY;  Service: Gastroenterology;;   ESOPHAGOGASTRODUODENOSCOPY N/A 01/28/2020   Procedure: ESOPHAGOGASTRODUODENOSCOPY (EGD);  Surgeon: Otis Brace, MD;  Location: [facility];  Service: Gastroenterology;  Laterality: N/A;   ESOPHAGOGASTRODUODENOSCOPY (EGD) WITH PROPOFOL Left 09/08/2015   Procedure: ESOPHAGOGASTRODUODENOSCOPY (EGD) WITH PROPOFOL;  Surgeon: Arta Silence, MD;  Location: [facility] ENDOSCOPY;  Service: Endoscopy;  Laterality: Left;   WISDOM TOOTH EXTRACTION       OB History     Gravida  0   Para  0   Term  0   Preterm  0   AB  0   Living  0      SAB  0   IAB  0   Ectopic  0   Multiple  0   Live Births  0           Family History  Problem Relation Age of Onset   Lung cancer Mother    Diabetes Mother    Bipolar disorder Father    Liver cancer Maternal Grandfather    Breast cancer Other        maternal great aunt   Heart disease Other    Ovarian cancer Other        maternal great aunt   Pancreatic cancer  Maternal Uncle    Prostate cancer Maternal Uncle    Diabetes Maternal Aunt        x 2   Kidney disease Other        maternal great aunt    Social History   Tobacco Use   Smoking status: Never   Smokeless tobacco: Never  Vaping Use   Vaping Use: Never used  Substance Use Topics   Alcohol use: Yes    Alcohol/week: 1.0 standard drink    Types: 1 Shots of liquor per week    Comment: occasional    Drug use: No    Home Medications Prior to Admission medications   Medication Sig Start Date End Date Taking? Authorizing Provider  atenolol (TENORMIN) 25 MG tablet Take 1 tablet (25 mg total) by mouth 2 (two) times daily. 01/30/20   Mariel Aloe, MD  Blood Glucose Monitoring Suppl (TRUE METRIX METER) w/Device KIT 1 Device by Does not apply route 4 (four) times daily. 07/05/19   Libby Maw, MD  erythromycin (ERY-TAB) 250 MG EC tablet Take 250 mg by mouth daily.    [provider]  glucose blood (TRUE METRIX BLOOD GLUCOSE TEST) test strip Use as instructed 07/05/19   Libby Maw, MD  hydrochlorothiazide (HYDRODIURIL) 25 MG tablet Take 1 tablet (25 mg total) by mouth daily. 06/14/20   [name], MD  hyoscyamine (LEVBID) 0.375 MG 12 hr tablet Take 1 tablet (0.375 mg total) by mouth every 12 (twelve) hours as needed for cramping (abdominal pain). 05/14/20   Raiford Noble Latif, DO  Insulin Pen Needle 31G X 5 MM MISC 1 Device by Does not apply route QID. For use with insulin pens 07/05/19   Libby Maw, MD  insulin regular (NOVOLIN R) 100 units/mL injection Inject 0-0.15 mLs (0-15 Units total) into the skin 3 (three) times daily before meals. CBG 70 - 120: 0 units CBG 121 - 150: 2 units CBG 151 - 200: 3 units CBG 201 - 250: 5 units CBG 251 - 300: 8 units CBG 301 - 350: 11 units CBG 351 - 400: 15 units 05/14/20   Sheikh, Omair Latif, DO  levalbuterol [facility] HFA) 45 MCG/ACT inhaler Inhale 2 puffs into the lungs every 6 (six) hours as needed for  wheezing or shortness of breath. 05/28/20   Little Ishikawa, MD  lisinopril (ZESTRIL) 10 MG tablet Take 1 tablet (10 mg total) by mouth daily. 06/10/20   Cherene Altes, MD  metoCLOPramide (REGLAN) 5 MG tablet Take 1 tablet (5 mg total) by mouth 3 (three) times daily before meals. 05/21/20   Esterwood, Amy S, PA-C  Multiple Vitamin (MULTIVITAMIN WITH MINERALS) TABS tablet Take 1 tablet by mouth daily.    [provider]  ondansetron (ZOFRAN ODT) 4 MG disintegrating tablet Take 1 tablet (4 mg  total) by mouth every 6 (six) hours as needed for nausea or vomiting. 04/20/20   Esterwood, Amy S, PA-C  pantoprazole (PROTONIX) 40 MG tablet Take 1 tablet (40 mg total) by mouth daily. 07/24/20   Dwyane Dee, MD  promethazine (PHENERGAN) 12.5 MG tablet Take 1 tablet (12.5 mg total) by mouth every 6 (six) hours as needed for nausea or vomiting. 05/14/20   Raiford Noble [place], DO  TRUEPLUS LANCETS 28G MISC assist with checking blood sugar TID and qhs 09/10/15   Zettie Pho S, PA-C  insulin aspart (NOVOLOG) 100 UNIT/ML FlexPen Inject 5 Units into the skin 3 (three) times daily with meals. 02/23/18 07/05/19  Donne Hazel, MD    Allergies    Patient has no known allergies.  Review of Systems   Review of Systems  Constitutional:  Negative for fever.  Eyes:  Negative for visual disturbance.  Respiratory:  Negative for cough.   Gastrointestinal:  Positive for abdominal pain, nausea and vomiting. Negative for diarrhea, hematemesis and hematochezia.  Genitourinary:  Negative for dysuria and hematuria.  Musculoskeletal:  Negative for neck pain.  Skin:  Negative for rash.  Neurological:  Negative for headaches.   Physical Exam Updated Vital Signs BP (!) 144/98 (BP Location: Right Arm)   Pulse (!) 130   Temp 99 F (37.2 C) (Oral)   Resp 16   Ht $R'5\' 8"'eE$  (1.727 m)   Wt 72.6 kg   SpO2 100%   BMI 24.33 kg/m   Physical Exam Vitals and nursing note reviewed.  Constitutional:      General: She is  in acute distress.     Appearance: She is well-developed.  HENT:     Head: Normocephalic and atraumatic.  Eyes:     Conjunctiva/sclera: Conjunctivae normal.  Cardiovascular:     Rate and Rhythm: Regular rhythm. Tachycardia present.     Heart sounds: No murmur heard. Pulmonary:     Effort: Pulmonary effort is normal. No respiratory distress.     Breath sounds: Normal breath sounds.  Abdominal:     Palpations: Abdomen is soft.     Tenderness: There is generalized abdominal tenderness. There is no guarding or rebound.  Musculoskeletal:        General: No deformity or signs of injury. Normal range of motion.     Cervical back: Neck supple.  Skin:    General: Skin is warm and dry.     Capillary Refill: Capillary refill takes less than 2 seconds.  Neurological:     General: No focal deficit present.     Mental Status: She is alert.    ED Results / Procedures / Treatments   Labs (all labs ordered are listed, but only abnormal results are displayed) Labs Reviewed  COMPREHENSIVE METABOLIC PANEL - Abnormal; Notable for the following components:      Result Value   Glucose, Bld 319 (*)    BUN 22 (*)    All other components within normal limits  CBC - Abnormal; Notable for the following components:   Hemoglobin 11.7 (*)    HCT 34.8 (*)    All other components within normal limits  BLOOD GAS, VENOUS - Abnormal; Notable for the following components:   pCO2, Ven 37.8 (*)    All other components within normal limits  CBC - Abnormal; Notable for the following components:   Hemoglobin 11.5 (*)    HCT 35.6 (*)    All other components within normal limits  BASIC METABOLIC PANEL - Abnormal;  Notable for the following components:   CO2 20 (*)    Glucose, Bld 369 (*)    BUN 21 (*)    Anion gap 19 (*)    All other components within normal limits  GLUCOSE, CAPILLARY - Abnormal; Notable for the following components:   Glucose-Capillary 336 (*)    All other components within normal limits   GLUCOSE, CAPILLARY - Abnormal; Notable for the following components:   Glucose-Capillary 293 (*)    All other components within normal limits  GLUCOSE, CAPILLARY - Abnormal; Notable for the following components:   Glucose-Capillary 362 (*)    All other components within normal limits  GLUCOSE, CAPILLARY - Abnormal; Notable for the following components:   Glucose-Capillary 294 (*)    All other components within normal limits  CBG MONITORING, ED - Abnormal; Notable for the following components:   Glucose-Capillary 252 (*)    All other components within normal limits  RESP PANEL BY RT-PCR (FLU A&B, COVID) ARPGX2  LIPASE, BLOOD  URINALYSIS, ROUTINE W REFLEX MICROSCOPIC  I-STAT BETA HCG BLOOD, ED ([facility], [facility], [facility] ONLY)    EKG None  Radiology No results found.  Procedures Procedures   Medications Ordered in ED Medications  sodium chloride 0.9 % bolus 1,000 mL (has no administration in time range)  HYDROmorphone (DILAUDID) injection 1 mg (has no administration in time range)  metoCLOPramide (REGLAN) injection 10 mg (has no administration in time range)    ED Course  I have reviewed the triage vital signs and the nursing notes.  Pertinent labs & imaging results that were available during my care of the patient were reviewed by me and considered in my medical decision making (see chart for details).  Clinical Course as of 08/13/20 0277  Nancy Fetter Aug 12, 2020  2137 States her pain is minimally improved.  She does not feel like she will be able to be discharged today.  On review of prior visits she looks like she gets admitted most every time. [MB]  2256 Discussed with [facility] hospitalist Dr. Alcario Drought who will evaluate the patient for admission. [MB]    Clinical Course User Index [MB] Hayden Rasmussen, MD   MDM Rules/Calculators/A&P                         This patient complains of abdominal pain nausea vomiting; this involves an extensive number of treatment Options and is a complaint  that carries with it a high risk of complications and Morbidity. The differential includes gastroparesis, gastritis, peptic ulcer disease, obstruction  I ordered, reviewed and interpreted labs, which included CBC with normal white count, hemoglobin low stable from priors, chemistries with elevated blood sugars normal gap, VBG with normal pH, pregnancy test negative, COVID testing negative I ordered medication IV fluids IV pain medication and nausea medication Previous records obtained and reviewed in epic including prior admissions this month I consulted Dr. Alcario Drought [facility] hospitalist and discussed lab and imaging findings  Critical Interventions: None  After the interventions stated above, I reevaluated the patient and found patient remain tachycardic and uncomfortable appearing.  She will need to be admitted to the hospital for further management of her symptoms.  She is in agreement with plan.   Final Clinical Impression(s) / ED Diagnoses Final diagnoses:  Epigastric pain  Gastroparesis    Rx / DC Orders ED Discharge Orders     None        Hayden Rasmussen, MD 08/13/20 [id number]

## 2020-08-13 ENCOUNTER — Encounter (HOSPITAL_COMMUNITY): Payer: Self-pay | Admitting: Internal Medicine

## 2020-08-13 DIAGNOSIS — Z818 Family history of other mental and behavioral disorders: Secondary | ICD-10-CM | POA: Diagnosis not present

## 2020-08-13 DIAGNOSIS — K3184 Gastroparesis: Secondary | ICD-10-CM | POA: Diagnosis present

## 2020-08-13 DIAGNOSIS — Z803 Family history of malignant neoplasm of breast: Secondary | ICD-10-CM | POA: Diagnosis not present

## 2020-08-13 DIAGNOSIS — Z89422 Acquired absence of other left toe(s): Secondary | ICD-10-CM | POA: Diagnosis not present

## 2020-08-13 DIAGNOSIS — Z833 Family history of diabetes mellitus: Secondary | ICD-10-CM | POA: Diagnosis not present

## 2020-08-13 DIAGNOSIS — K219 Gastro-esophageal reflux disease without esophagitis: Secondary | ICD-10-CM | POA: Diagnosis present

## 2020-08-13 DIAGNOSIS — Z79899 Other long term (current) drug therapy: Secondary | ICD-10-CM | POA: Diagnosis not present

## 2020-08-13 DIAGNOSIS — I10 Essential (primary) hypertension: Secondary | ICD-10-CM | POA: Diagnosis present

## 2020-08-13 DIAGNOSIS — Z801 Family history of malignant neoplasm of trachea, bronchus and lung: Secondary | ICD-10-CM | POA: Diagnosis not present

## 2020-08-13 DIAGNOSIS — Z794 Long term (current) use of insulin: Secondary | ICD-10-CM | POA: Diagnosis not present

## 2020-08-13 DIAGNOSIS — Z20822 Contact with and (suspected) exposure to covid-19: Secondary | ICD-10-CM | POA: Diagnosis present

## 2020-08-13 DIAGNOSIS — Z800 Family history of malignant neoplasm of digestive organs: Secondary | ICD-10-CM | POA: Diagnosis not present

## 2020-08-13 DIAGNOSIS — E1043 Type 1 diabetes mellitus with diabetic autonomic (poly)neuropathy: Secondary | ICD-10-CM | POA: Diagnosis present

## 2020-08-13 DIAGNOSIS — R1013 Epigastric pain: Secondary | ICD-10-CM | POA: Diagnosis not present

## 2020-08-13 DIAGNOSIS — Z8041 Family history of malignant neoplasm of ovary: Secondary | ICD-10-CM | POA: Diagnosis not present

## 2020-08-13 DIAGNOSIS — E1065 Type 1 diabetes mellitus with hyperglycemia: Secondary | ICD-10-CM | POA: Diagnosis present

## 2020-08-13 LAB — CBC
HCT: 35.6 % — ABNORMAL LOW (ref 36.0–46.0)
Hemoglobin: 11.5 g/dL — ABNORMAL LOW (ref 12.0–15.0)
MCH: 29.3 pg (ref 26.0–34.0)
MCHC: 32.3 g/dL (ref 30.0–36.0)
MCV: 90.8 fL (ref 80.0–100.0)
Platelets: 391 10*3/uL (ref 150–400)
RBC: 3.92 MIL/uL (ref 3.87–5.11)
RDW: 13.3 % (ref 11.5–15.5)
WBC: 8.1 10*3/uL (ref 4.0–10.5)
nRBC: 0 % (ref 0.0–0.2)

## 2020-08-13 LAB — BASIC METABOLIC PANEL
Anion gap: 19 — ABNORMAL HIGH (ref 5–15)
BUN: 21 mg/dL — ABNORMAL HIGH (ref 6–20)
CO2: 20 mmol/L — ABNORMAL LOW (ref 22–32)
Calcium: 10.2 mg/dL (ref 8.9–10.3)
Chloride: 100 mmol/L (ref 98–111)
Creatinine, Ser: 0.88 mg/dL (ref 0.44–1.00)
GFR, Estimated: >60 mL/min (ref >60)
Glucose, Bld: 369 mg/dL — ABNORMAL HIGH (ref 70–99)
Potassium: 4.6 mmol/L (ref 3.5–5.1)
Sodium: 139 mmol/L (ref 135–145)

## 2020-08-13 LAB — GLUCOSE, CAPILLARY
Glucose-Capillary: 149 mg/dL — ABNORMAL HIGH (ref 70–99)
Glucose-Capillary: 169 mg/dL — ABNORMAL HIGH (ref 70–99)
Glucose-Capillary: 223 mg/dL — ABNORMAL HIGH (ref 70–99)
Glucose-Capillary: 293 mg/dL — ABNORMAL HIGH (ref 70–99)
Glucose-Capillary: 294 mg/dL — ABNORMAL HIGH (ref 70–99)
Glucose-Capillary: 299 mg/dL — ABNORMAL HIGH (ref 70–99)
Glucose-Capillary: 336 mg/dL — ABNORMAL HIGH (ref 70–99)
Glucose-Capillary: 362 mg/dL — ABNORMAL HIGH (ref 70–99)

## 2020-08-13 MED ORDER — HYDROMORPHONE HCL 1 MG/ML IJ SOLN
1.0000 mg | INTRAMUSCULAR | Status: DC | PRN
  Administered 2020-08-13 (×3): 1 mg via INTRAVENOUS
  Filled 2020-08-13 (×3): qty 1

## 2020-08-13 MED ORDER — ALBUTEROL SULFATE (2.5 MG/3ML) 0.083% IN NEBU: 2.5000 mg | INHALATION_SOLUTION | RESPIRATORY_TRACT | Status: DC | PRN

## 2020-08-13 NOTE — Plan of Care (Signed)
  Problem: Education: Goal: Knowledge of General Education information will improve Description: Including pain rating scale, medication(s)/side effects and non-pharmacologic comfort measures Outcome: Progressing   Problem: Clinical Measurements: Goal: Diagnostic test results will improve Outcome: Progressing   Problem: Elimination: Goal: Will not experience complications related to bowel motility Outcome: Progressing   Problem: Pain Managment: Goal: General experience of comfort will improve 08/13/2020 0210 by Julieanne Manson, RN Outcome: Progressing 08/13/2020 0137 by Julieanne Manson, RN Outcome: Progressing   Problem: Safety: Goal: Ability to remain free from injury will improve Outcome: Progressing

## 2020-08-13 NOTE — Progress Notes (Signed)
PROGRESS NOTE  Kelli Hudson HQI:[id number] DOB: 03-Sep-1990 DOA: 08/12/2020 PCP: Loyola Mast, MD   LOS: 0 days   Brief Narrative / Interim history: 30 year old female with history of type 1 diabetes mellitus, hypertension, recurrent diabetic gastroparesis who comes into the hospital with nausea, vomiting and abdominal pain.  Symptoms started about 3 days ago, have remained persistent despite taking her usual gastroparesis oral regimen.  Subjective / 24h Interval events: She appears in significant pain, rocking back and forth.  She is unable to talk much ue to pain but I was able to discuss with her mother over the phone  Assessment & Plan: Principal Problem Recurrent gastroparesis -this is likely in the setting of diabetes mellitus.  Her gastroparesis episodes have been happening more and more often in the last 6 months or so. -Chart review shows that she only had 1 hospitalization in 2020, 1 hospitalization in 2021 however this year alone has been hospitalized 7 times with recurrent symptoms suggesting gradual worsening of her gastroparesis. -Patient and her mother have seen PCP, GI, dietitian as an outpatient and have been working really hard to try to manage this however it is getting out of control and she gets sick more more often. -She is clearly in pain today, has elevated heart rate and elevated blood pressure and will place on IV Dilaudid which worked for her in the past.  Continue erythromycin, Reglan, IV fluids and n.p.o. status.  We will wean off narcotics gradually once she is starting to improve -I think she would probably benefit from referral to tertiary center to be seen as an outpatient with a gastroparesis expert  Active Problems Type 1 diabetes mellitus, insulin-dependent-poorly controlled, A1c around 10, with hyperglycemia.  Continue Lantus, sliding scale.  Diabetes coordinator consult  Essential hypertension-continue home lisinopril, blood pressure high this  morning likely in the setting of pain  Scheduled Meds:  atenolol  25 mg Oral BID   enoxaparin (LOVENOX) injection  40 mg Subcutaneous Q24H   insulin aspart  0-9 Units Subcutaneous Q4H   insulin glargine  15 Units Subcutaneous Daily   lisinopril  10 mg Oral Daily   metoCLOPramide (REGLAN) injection  10 mg Intravenous Q8H   multivitamin with minerals  1 tablet Oral Daily   pantoprazole  40 mg Oral Daily   Continuous Infusions:  erythromycin 250 mg (08/13/20 0605)   lactated ringers 125 mL/hr at 08/13/20 0022   PRN Meds:.acetaminophen **OR** acetaminophen, albuterol, HYDROmorphone (DILAUDID) injection, ketorolac, labetalol, LORazepam, ondansetron **OR** ondansetron (ZOFRAN) IV, promethazine  Diet Orders (From admission, onward)     Start     Ordered   08/12/20 2303  Diet NPO time specified Except for: Sips with Meds  Diet effective now       Question:  Except for  Answer:  Sips with Meds   08/12/20 2304            DVT prophylaxis: enoxaparin (LOVENOX) injection 40 mg Start: 08/13/20 1000     Code Status: Full Code  Family Communication: mother over the phone   Status is: Inpatient  The patient will require care spanning > 2 midnights and should be moved to inpatient because: IV treatments appropriate due to intensity of illness or inability to take PO and Inpatient level of care appropriate due to severity of illness  Dispo: The patient is from: Home              Anticipated d/c is to: Home  Patient currently is not medically stable to d/c.   Difficult to place patient No   Level of care: Telemetry  Consultants:  None   Procedures:  None  Microbiology  none  Antimicrobials: none    Objective: Vitals:   08/13/20 0232 08/13/20 0434 08/13/20 0606 08/13/20 0821  BP: (!) 181/124 (!) 178/100 (!) 156/105 (!) 145/91  Pulse: (!) 117 (!) 110 (!) 121 (!) 122  Resp: 18 18 16 14   Temp: 98.1 F (36.7 C) 98.4 F (36.9 C) 99.2 F (37.3 C) 98.6 F (37  C)  TempSrc: Oral  Oral Oral  SpO2: 100% 100% 100% 100%  Weight:      Height:        Intake/Output Summary (Last 24 hours) at 08/13/2020 1038 Last data filed at 08/13/2020 0659 Gross per 24 hour  Intake 556.33 ml  Output --  Net 556.33 ml   Filed Weights   08/12/20 1845 08/13/20 0020  Weight: 72.6 kg 72.6 kg    Examination:  Constitutional: Appears uncomfortable and in pain Eyes: no scleral icterus ENMT: Mucous membranes are moist.  Neck: normal, supple Respiratory: clear to auscultation bilaterally, no wheezing, no crackles.  Cardiovascular: Tachycardic, no edema Abdomen: Deferred due to pain Musculoskeletal: no clubbing / cyanosis.  Skin: no rashes Neurologic: No focal deficits  Data Reviewed: I have independently reviewed following labs and imaging studies   CBC: Recent Labs  Lab 08/12/20 1848 08/13/20 0501  WBC 8.5 8.1  HGB 11.7* 11.5*  HCT 34.8* 35.6*  MCV 88.5 90.8  PLT 397 391   Basic Metabolic Panel: Recent Labs  Lab 08/12/20 1848 08/13/20 0501  NA 137 139  K 3.7 4.6  CL 101 100  CO2 22 20*  GLUCOSE 319* 369*  BUN 22* 21*  CREATININE 0.96 0.88  CALCIUM 9.8 10.2   Liver Function Tests: Recent Labs  Lab 08/12/20 1848  AST 23  ALT 26  ALKPHOS 72  BILITOT 1.1  PROT 7.9  ALBUMIN 4.0   Coagulation Profile: No results for input(s): INR, PROTIME in the last 168 hours. HbA1C: No results for input(s): HGBA1C in the last 72 hours. CBG: Recent Labs  Lab 08/12/20 1850 08/12/20 2356 08/13/20 0032 08/13/20 0452 08/13/20 0812  GLUCAP 252* 293* 336* 362* 294*    Recent Results (from the past 240 hour(s))  Resp Panel by RT-PCR (Flu A&B, Covid) Nasopharyngeal Swab     Status: None   Collection Time: 08/12/20  7:07 PM   Specimen: Nasopharyngeal Swab; Nasopharyngeal(NP) swabs in vial transport medium  Result Value Ref Range Status   SARS Coronavirus 2 by RT PCR NEGATIVE NEGATIVE Final    Comment: (NOTE) SARS-CoV-2 target nucleic acids are  NOT DETECTED.  The SARS-CoV-2 RNA is generally detectable in upper respiratory specimens during the acute phase of infection. The lowest concentration of SARS-CoV-2 viral copies this assay can detect is 138 copies/mL. A negative result does not preclude SARS-Cov-2 infection and should not be used as the sole basis for treatment or other patient management decisions. A negative result may occur with  improper specimen collection/handling, submission of specimen other than nasopharyngeal swab, presence of viral mutation(s) within the areas targeted by this assay, and inadequate number of viral copies(<138 copies/mL). A negative result must be combined with clinical observations, patient history, and epidemiological information. The expected result is Negative.  Fact Sheet for Patients:  08/14/20  Fact Sheet for Healthcare Providers:  BloggerCourse.com  This test is no t yet approved or cleared by  the [employer] and  has been authorized for detection and/or diagnosis of SARS-CoV-2 by FDA under an Emergency Use Authorization (EUA). This EUA will remain  in effect (meaning this test can be used) for the duration of the COVID-19 declaration under Section 564(b)(1) of the Act, 21 U.S.C.section 360bbb-3(b)(1), unless the authorization is terminated  or revoked sooner.       Influenza A by PCR NEGATIVE NEGATIVE Final   Influenza B by PCR NEGATIVE NEGATIVE Final    Comment: (NOTE) The Xpert Xpress SARS-CoV-2/FLU/RSV plus assay is intended as an aid in the diagnosis of influenza from Nasopharyngeal swab specimens and should not be used as a sole basis for treatment. Nasal washings and aspirates are unacceptable for Xpert Xpress SARS-CoV-2/FLU/RSV testing.  Fact Sheet for Patients: BloggerCourse.com  Fact Sheet for Healthcare Providers: SeriousBroker.it  This test is not  yet approved or cleared by the [place] FDA and has been authorized for detection and/or diagnosis of SARS-CoV-2 by FDA under an Emergency Use Authorization (EUA). This EUA will remain in effect (meaning this test can be used) for the duration of the COVID-19 declaration under Section 564(b)(1) of the Act, 21 U.S.C. section 360bbb-3(b)(1), unless the authorization is terminated or revoked.  Performed at [facility], [address], [place], [place] [address]      Radiology Studies: No results found.   Pamella Pert, MD, PhD [facility]  Between 7 am - 7 pm I am available, please contact me via Amion (for emergencies) or Securechat (non urgent messages)  Between 7 pm - 7 am I am not available, please contact night coverage MD/APP via Amion

## 2020-08-13 NOTE — Progress Notes (Signed)
   08/13/20 0020  Assess: MEWS Score  Temp 97.8 F (36.6 C)  BP (!) 180/119  Pulse Rate (!) 127  Resp 20  SpO2 100 %  O2 Device Room Air  Assess: MEWS Score  MEWS Temp 0  MEWS Systolic 0  MEWS Pulse 2  MEWS RR 0  MEWS LOC 0  MEWS Score 2  MEWS Score Color Yellow  Assess: if the MEWS score is Yellow or Red  Were vital signs taken at a resting state? Yes  Focused Assessment No change from prior assessment  Does the patient meet 2 or more of the SIRS criteria? No  Does the patient have a confirmed or suspected source of infection? No  MEWS guidelines implemented *See Row Information* Yes  Treat  MEWS Interventions Administered prn meds/treatments  Take Vital Signs  Increase Vital Sign Frequency  Yellow: Q 2hr X 2 then Q 4hr X 2, if remains yellow, continue Q 4hrs  Escalate  MEWS: Escalate Yellow: discuss with charge nurse/RN and consider discussing with provider and RRT  Notify: Charge Nurse/RN  Name of Charge Nurse/RN Notified Debbie, RN  Date Charge Nurse/RN Notified 08/13/20  Time Charge Nurse/RN Notified 0020  Document  Progress note created (see row info) Yes  Assess: SIRS CRITERIA  SIRS Temperature  0  SIRS Pulse 1  SIRS Respirations  0  SIRS WBC 0  SIRS Score Sum  1

## 2020-08-13 NOTE — Progress Notes (Signed)
Received pt from the ED at 0007. Patient is AAOX4. On RA, breathing is even and effortless. Patient complains of N/V, abdominal pain, reported some relief after receiving PRN meds. Patient was oriented to room, call bell placed within reach.

## 2020-08-13 NOTE — Progress Notes (Signed)
Initial Nutrition Assessment  INTERVENTION:   Once diet advanced: -Ensure Enlive po BID, each supplement provides 350 kcal and 20 grams of protein  NUTRITION DIAGNOSIS:   Inadequate oral intake related to  (gastroparesis) as evidenced by per patient/family report, NPO status.  GOAL:   Patient will meet greater than or equal to 90% of their needs  MONITOR:   Diet advancement, Labs, Weight trends, I & O's  REASON FOR ASSESSMENT:   Malnutrition Screening Tool    ASSESSMENT:   30 year old female with history of type 1 diabetes mellitus, hypertension, recurrent diabetic gastroparesis who comes into the hospital with nausea, vomiting and abdominal pain.  Symptoms started about 3 days ago, have remained persistent despite taking her usual gastroparesis oral regimen.  Patient familiar to RD from previous admissions. Pt with history of multiple admissions given recurrent gastroparesis and type 1 diabetes. Pt has had diet education in the past and per family has been seeing a dietitian outpatient as well to help with management of symptoms. Pt's symptoms of N/V this time started ~ 3 days PTA. Currently NPO. Once diet is able to be advanced, will order Ensure supplements. Pt drinks these at home.   No weight changes per weight records. Not sure if pt was weighed at admission.  Medications: IV Reglan, Multivitamin with minerals daily, Lactated ringers, IV Zofran  Labs reviewed: CBGs: 293-362   NUTRITION - FOCUSED PHYSICAL EXAM:  No depletions.  Diet Order:   Diet Order             Diet NPO time specified Except for: Sips with Meds  Diet effective now                   EDUCATION NEEDS:   No education needs have been identified at this time  Skin:  Skin Assessment: Reviewed RN Assessment  Last BM:  6/26  Height:   Ht Readings from Last 1 Encounters:  08/13/20 5\' 8"  (1.727 m)    Weight:   Wt Readings from Last 1 Encounters:  08/13/20 72.6 kg    BMI:  Body  mass index is 24.34 kg/m.  Estimated Nutritional Needs:   Kcal:  1800-2000  Protein:  70-85g  Fluid:  1.8L/day  08/15/20, MS, RD, LDN Inpatient Clinical Dietitian Contact information available via Amion

## 2020-08-13 NOTE — Progress Notes (Signed)
Inpatient Diabetes Program Recommendations  AACE/ADA: New Consensus Statement on Inpatient Glycemic Control (2015)  Target Ranges:  Prepandial:   less than 140 mg/dL      Peak postprandial:   less than 180 mg/dL (1-2 hours)      Critically ill patients:  140 - 180 mg/dL   Lab Results  Component Value Date   GLUCAP 223 (H) 08/13/2020   HGBA1C 10.2 (H) 06/27/2020    Review of Glycemic Control  Diabetes history: DM1 Outpatient Diabetes medications: Levemir 18 units QHS, Novolin R 0-15 units TID + 5 units TID Current orders for Inpatient glycemic control: Lantus 15 units QD, Novolog 0-9 units Q4H  HgbA1C - 10.2% CBGs 294, 299, 149, 223 mg/dL Very poor po intake On CL diet  Inpatient Diabetes Program Recommendations:    Increase Levemir to 18 units QD. If po intake increases, add Novolog 3 units TID with meals.  Spoke with pt about her diabetes control and HgbA1C of 10.2% (down from 11.5% on 05/09/20) States she is trying to get back on track. Went through a period of not taking her insulin and did not check blood sugars. Discussed insulin pump with patient. Has been referred to [facility] Endo and is requesting Ernest Haber, MD. Encouraged pt to read about the different insulin pumps and look at pros and cons of each one. Has worn Dexcom in the past, but said she was allergic to the adhesive. We discussed how a pump is similar to your pancreas by delivering a basal rate over 24H period. Pt would bolus for CHOs and correction. Answered all of pt's questions. Seems to understand importance of controlling her diabetes and looks forward to seeing an Endocrinologist. Discussed gastroparesis and improvement in blood sugar control will help with symptoms.   Continue to follow daily CBGs.  Thank you. Ailene Ards, RD, LDN, CDE Inpatient Diabetes Coordinator [phone]

## 2020-08-14 LAB — GLUCOSE, CAPILLARY
Glucose-Capillary: 100 mg/dL — ABNORMAL HIGH (ref 70–99)
Glucose-Capillary: 147 mg/dL — ABNORMAL HIGH (ref 70–99)
Glucose-Capillary: 147 mg/dL — ABNORMAL HIGH (ref 70–99)
Glucose-Capillary: 177 mg/dL — ABNORMAL HIGH (ref 70–99)
Glucose-Capillary: 238 mg/dL — ABNORMAL HIGH (ref 70–99)
Glucose-Capillary: 270 mg/dL — ABNORMAL HIGH (ref 70–99)

## 2020-08-14 LAB — BASIC METABOLIC PANEL
Anion gap: 6 (ref 5–15)
BUN: 27 mg/dL — ABNORMAL HIGH (ref 6–20)
CO2: 26 mmol/L (ref 22–32)
Calcium: 9.3 mg/dL (ref 8.9–10.3)
Chloride: 106 mmol/L (ref 98–111)
Creatinine, Ser: 0.96 mg/dL (ref 0.44–1.00)
GFR, Estimated: >60 mL/min (ref >60)
Glucose, Bld: 167 mg/dL — ABNORMAL HIGH (ref 70–99)
Potassium: 3.8 mmol/L (ref 3.5–5.1)
Sodium: 138 mmol/L (ref 135–145)

## 2020-08-14 LAB — CBC
HCT: 30.3 % — ABNORMAL LOW (ref 36.0–46.0)
Hemoglobin: 9.9 g/dL — ABNORMAL LOW (ref 12.0–15.0)
MCH: 29.9 pg (ref 26.0–34.0)
MCHC: 32.7 g/dL (ref 30.0–36.0)
MCV: 91.5 fL (ref 80.0–100.0)
Platelets: 299 10*3/uL (ref 150–400)
RBC: 3.31 MIL/uL — ABNORMAL LOW (ref 3.87–5.11)
RDW: 13.5 % (ref 11.5–15.5)
WBC: 12.7 10*3/uL — ABNORMAL HIGH (ref 4.0–10.5)
nRBC: 0 % (ref 0.0–0.2)

## 2020-08-14 MED ORDER — ENSURE ENLIVE PO LIQD: 237.0000 mL | Freq: Two times a day (BID) | ORAL | Status: DC

## 2020-08-14 MED ORDER — HYDROMORPHONE HCL 1 MG/ML IJ SOLN
1.0000 mg | INTRAMUSCULAR | Status: DC | PRN
  Administered 2020-08-14: 1 mg via INTRAVENOUS
  Filled 2020-08-14: qty 1

## 2020-08-14 MED ORDER — INSULIN GLARGINE 100 UNIT/ML ~~LOC~~ SOLN
18.0000 [IU] | Freq: Every day | SUBCUTANEOUS | Status: DC
  Administered 2020-08-14: 18 [IU] via SUBCUTANEOUS
  Filled 2020-08-14 (×2): qty 0.18

## 2020-08-14 NOTE — Consult Note (Signed)
[facility] Inpatient Consult   08/14/2020  Maxie Debose [birth year] [id number]  [facility] [[facility]]  Accountable Care Organization [ACO] Patient: [facility]   Patient was screened for less than 30 days readmission and extreme risk score for unplanned readmission risk. Per review, patient working with embedded chronic care management team at primary care office.   Plan:  Will continue to follow for progression and disposition plans and notify outpatient chronic care manager.  Of note, [facility] Care Management services does not replace or interfere with any services that are arranged by inpatient case management or social work.   Christophe Louis, MSN, RN [facility] Liaison Nurse Mobile Phone [phone]  Toll free office [phone]

## 2020-08-14 NOTE — Progress Notes (Addendum)
PROGRESS NOTE  Kelli Hudson DJT:[id number] DOB: 1991-02-01 DOA: 08/12/2020 PCP: Loyola Mast, MD   LOS: 1 day   Brief Narrative / Interim history: 30 year old female with history of type 1 diabetes mellitus, hypertension, recurrent diabetic gastroparesis who comes into the hospital with nausea, vomiting and abdominal pain.  Symptoms started about 3 days ago, have remained persistent despite taking her usual gastroparesis oral regimen.  Subjective / 24h Interval events: Looks much more comfortable this morning, was able to rest overnight.  Only required 3 doses of Dilaudid all day yesterday and nothing overnight.  She was able to eat clear liquids  Assessment & Plan: Principal Problem Recurrent gastroparesis -this is likely in the setting of diabetes mellitus.  Her gastroparesis episodes have been happening more and more often in the last 6 months or so. Chart review shows that she only had 1 hospitalization in 2020, 1 hospitalization in 2021 however this year alone has been hospitalized 7 times with recurrent symptoms suggesting gradual worsening of her gastroparesis. Patient and her mother have seen PCP, GI, dietitian as an outpatient and have been working really hard to try to manage this however it is getting out of control and she gets sick more more often. I think she would probably benefit from referral to tertiary center to be seen as an outpatient with a gastroparesis expert -With treatment of her pain her blood pressure is now normalized, her significant tachycardia has improved and she appears more comfortable.  She was tolerating clear liquids last night, advance to full liquids this morning / lunch and if she does well eventually solids for dinner.  May be able to go home on Wednesday.  Space out the Dilaudid -Continue Reglan, antiemetics, erythromycin, decrease fluid rate today  Active Problems Type 1 diabetes mellitus, insulin-dependent-poorly controlled, A1c around 10,  with hyperglycemia.  Continue Lantus, sliding scale.  Diabetes coordinator consult appreciated  Essential hypertension-continue home lisinopril, blood pressure now normalized with treatment of her pain  Scheduled Meds:  atenolol  25 mg Oral BID   enoxaparin (LOVENOX) injection  40 mg Subcutaneous Q24H   feeding supplement  237 mL Oral BID BM   insulin aspart  0-9 Units Subcutaneous Q4H   insulin glargine  18 Units Subcutaneous Daily   lisinopril  10 mg Oral Daily   metoCLOPramide (REGLAN) injection  10 mg Intravenous Q8H   multivitamin with minerals  1 tablet Oral Daily   pantoprazole  40 mg Oral Daily   Continuous Infusions:  erythromycin 250 mg (08/14/20 0612)   lactated ringers 125 mL/hr at 08/14/20 0809   PRN Meds:.acetaminophen **OR** acetaminophen, albuterol, HYDROmorphone (DILAUDID) injection, ketorolac, labetalol, LORazepam, ondansetron **OR** ondansetron (ZOFRAN) IV, promethazine  Diet Orders (From admission, onward)     Start     Ordered   08/14/20 0746  Diet full liquid Room service appropriate? Yes; Fluid consistency: Thin  Diet effective now       Question Answer Comment  Room service appropriate? Yes   Fluid consistency: Thin      08/14/20 0745            DVT prophylaxis: enoxaparin (LOVENOX) injection 40 mg Start: 08/13/20 1000     Code Status: Full Code  Family Communication: mother over the phone   Status is: Inpatient  The patient will require care spanning > 2 midnights and should be moved to inpatient because: IV treatments appropriate due to intensity of illness or inability to take PO and Inpatient level of care appropriate due  to severity of illness  Dispo: The patient is from: Home              Anticipated d/c is to: Home              Patient currently is not medically stable to d/c.   Difficult to place patient No   Level of care: Med-Surg  Consultants:  None   Procedures:  None  Microbiology  none  Antimicrobials: none     Objective: Vitals:   08/13/20 1212 08/13/20 1328 08/13/20 2001 08/14/20 0402  BP: 113/68 116/76 119/78 112/72  Pulse: (!) 110 (!) 106 (!) 109 91  Resp: 16 14 18 18   Temp: 98.5 F (36.9 C) 98.1 F (36.7 C) 98 F (36.7 C) 97.7 F (36.5 C)  TempSrc: Oral Oral Oral Oral  SpO2: 100% 100% 100% 98%  Weight:      Height:        Intake/Output Summary (Last 24 hours) at 08/14/2020 1036 Last data filed at 08/13/2020 1500 Gross per 24 hour  Intake 1097.11 ml  Output --  Net 1097.11 ml    Filed Weights   08/12/20 1845 08/13/20 0020  Weight: 72.6 kg 72.6 kg    Examination:  Constitutional: No distress, comfortable Eyes: No scleral icterus ENMT: mmm Neck: normal, supple Respiratory: Clear bilaterally, no wheezing Cardiovascular: Regular rate and rhythm, no edema Musculoskeletal: no clubbing / cyanosis.  Skin: No rashes Neurologic: Nonfocal  Data Reviewed: I have independently reviewed following labs and imaging studies   CBC: Recent Labs  Lab 08/12/20 1848 08/13/20 0501 08/14/20 0440  WBC 8.5 8.1 12.7*  HGB 11.7* 11.5* 9.9*  HCT 34.8* 35.6* 30.3*  MCV 88.5 90.8 91.5  PLT 397 391 299    Basic Metabolic Panel: Recent Labs  Lab 08/12/20 1848 08/13/20 0501 08/14/20 0440  NA 137 139 138  K 3.7 4.6 3.8  CL 101 100 106  CO2 22 20* 26  GLUCOSE 319* 369* 167*  BUN 22* 21* 27*  CREATININE 0.96 0.88 0.96  CALCIUM 9.8 10.2 9.3    Liver Function Tests: Recent Labs  Lab 08/12/20 1848  AST 23  ALT 26  ALKPHOS 72  BILITOT 1.1  PROT 7.9  ALBUMIN 4.0    Coagulation Profile: No results for input(s): INR, PROTIME in the last 168 hours. HbA1C: No results for input(s): HGBA1C in the last 72 hours. CBG: Recent Labs  Lab 08/13/20 1650 08/13/20 2005 08/13/20 2358 08/14/20 0359 08/14/20 0841  GLUCAP 149* 223* 169* 147* 177*     Recent Results (from the past 240 hour(s))  Resp Panel by RT-PCR (Flu A&B, Covid) Nasopharyngeal Swab     Status: None    Collection Time: 08/12/20  7:07 PM   Specimen: Nasopharyngeal Swab; Nasopharyngeal(NP) swabs in vial transport medium  Result Value Ref Range Status   SARS Coronavirus 2 by RT PCR NEGATIVE NEGATIVE Final    Comment: (NOTE) SARS-CoV-2 target nucleic acids are NOT DETECTED.  The SARS-CoV-2 RNA is generally detectable in upper respiratory specimens during the acute phase of infection. The lowest concentration of SARS-CoV-2 viral copies this assay can detect is 138 copies/mL. A negative result does not preclude SARS-Cov-2 infection and should not be used as the sole basis for treatment or other patient management decisions. A negative result may occur with  improper specimen collection/handling, submission of specimen other than nasopharyngeal swab, presence of viral mutation(s) within the areas targeted by this assay, and inadequate number of viral copies(<138 copies/mL). A  negative result must be combined with clinical observations, patient history, and epidemiological information. The expected result is Negative.  Fact Sheet for Patients:  BloggerCourse.com  Fact Sheet for Healthcare Providers:  SeriousBroker.it  This test is no t yet approved or cleared by the [place] FDA and  has been authorized for detection and/or diagnosis of SARS-CoV-2 by FDA under an Emergency Use Authorization (EUA). This EUA will remain  in effect (meaning this test can be used) for the duration of the COVID-19 declaration under Section 564(b)(1) of the Act, 21 U.S.C.section 360bbb-3(b)(1), unless the authorization is terminated  or revoked sooner.       Influenza A by PCR NEGATIVE NEGATIVE Final   Influenza B by PCR NEGATIVE NEGATIVE Final    Comment: (NOTE) The Xpert Xpress SARS-CoV-2/FLU/RSV plus assay is intended as an aid in the diagnosis of influenza from Nasopharyngeal swab specimens and should not be used as a sole basis for treatment.  Nasal washings and aspirates are unacceptable for Xpert Xpress SARS-CoV-2/FLU/RSV testing.  Fact Sheet for Patients: BloggerCourse.com  Fact Sheet for Healthcare Providers: SeriousBroker.it  This test is not yet approved or cleared by the [place] FDA and has been authorized for detection and/or diagnosis of SARS-CoV-2 by FDA under an Emergency Use Authorization (EUA). This EUA will remain in effect (meaning this test can be used) for the duration of the COVID-19 declaration under Section 564(b)(1) of the Act, 21 U.S.C. section 360bbb-3(b)(1), unless the authorization is terminated or revoked.  Performed at [facility], [address], [place], [place] [address]       Radiology Studies: No results found.   Pamella Pert, MD, PhD [facility]  Between 7 am - 7 pm I am available, please contact me via Amion (for emergencies) or Securechat (non urgent messages)  Between 7 pm - 7 am I am not available, please contact night coverage MD/APP via Amion

## 2020-08-15 LAB — GLUCOSE, CAPILLARY
Glucose-Capillary: 144 mg/dL — ABNORMAL HIGH (ref 70–99)
Glucose-Capillary: 149 mg/dL — ABNORMAL HIGH (ref 70–99)

## 2020-08-15 MED ORDER — INSULIN GLARGINE 100 UNIT/ML ~~LOC~~ SOLN: 18.0000 [IU] | Freq: Every day | SUBCUTANEOUS | 0 refills | Status: DC

## 2020-08-15 MED ORDER — METOCLOPRAMIDE HCL 10 MG PO TABS
10.0000 mg | ORAL_TABLET | Freq: Three times a day (TID) | ORAL | 0 refills | Status: DC
Start: 2020-08-15 — End: 2020-10-17

## 2020-08-15 MED ORDER — INSULIN PEN NEEDLE 31G X 5 MM MISC: 0 refills | Status: DC

## 2020-08-15 NOTE — Discharge Summary (Signed)
Physician Discharge Summary  Kelli Hudson YFV:[id number] DOB: 31-Mar-1990 DOA: 08/12/2020  PCP: [name], MD  Admit date: 08/12/2020 Discharge date: 08/15/2020  Admitted From: Home Disposition: Home  Recommendations for Outpatient Follow-up:  Follow up with PCP in 1 week with repeat CBC/BMP Outpatient follow-up with gastroenterology and endocrinology  Follow up in ED if symptoms worsen or new appear   Home Health: No Equipment/Devices: None  Discharge Condition: Stable CODE STATUS: Full Diet recommendation: carb modified/carb modified  Brief/Interim Summary: 30 year old female with history of type 1 diabetes mellitus, hypertension, recurrent diabetic gastroparesis presented with nausea, vomiting and abdominal pain.  She was admitted with recurrent gastroparesis and treated with IV fluids, Reglan and erythromycin.  During the hospitalization, her symptoms have improved.  Her diet has been gradually advanced and she is tolerating her diet.  No more vomiting.  She feels much better and will be discharged home today with outpatient follow-up with PCP/gastroenterology/endocrinology.  Discharge Diagnoses:   Recurrent gastroparesis -Patient has had multiple hospitalizations this year -Treated with IV fluids, Reglan, erythromycin.  Initially was NPO.  Diet was gradually started and advanced.  She is tolerating diet.  She feels much better and wants to go home today. -Discharge home today.  Continue erythromycin.  Continue increased dose of Reglan 10 mg 3 times a day for now till reevaluation by PCP.  Will need outpatient follow-up with gastroenterology  Diabetes mellitus type 1 uncontrolled with hyperglycemia -A1c around 10.  Diabetes coordinator following.  Carb modified diet.  Discharge patient on Lantus 18 units daily.  Outpatient follow-up with endocrinology  Essential hypertension -Continue niacin pill.  Hydrochlorothiazide to remain on hold for now.  Outpatient follow-up  with PCP  Discharge Instructions  Discharge Instructions     Ambulatory referral to Endocrinology   Complete by: As directed    Ambulatory referral to Gastroenterology   Complete by: As directed    Gastroparesis   Diet - low sodium heart healthy   Complete by: As directed    Diet Carb Modified   Complete by: As directed    Increase activity slowly   Complete by: As directed       Allergies as of 08/15/2020   No Known Allergies      Medication List     STOP taking these medications    hydrochlorothiazide 25 MG tablet Commonly known as: HYDRODIURIL       TAKE these medications    atenolol 25 MG tablet Commonly known as: TENORMIN Take 1 tablet (25 mg total) by mouth 2 (two) times daily.   erythromycin 250 MG tablet Commonly known as: E-MYCIN Take 250 mg by mouth 2 (two) times daily.   hyoscyamine 0.375 MG 12 hr tablet Commonly known as: LEVBID Take 1 tablet (0.375 mg total) by mouth every 12 (twelve) hours as needed for cramping (abdominal pain).   insulin glargine 100 UNIT/ML injection Commonly known as: LANTUS Inject 0.18 mLs (18 Units total) into the skin daily.   Insulin Pen Needle 31G X 5 MM Misc 1 Device by Does not apply route QID. For use with insulin pens What changed: Another medication with the same name was added. Make sure you understand how and when to take each.   Insulin Pen Needle 31G X 5 MM Misc Lantus 18 units daily What changed: You were already taking a medication with the same name, and this prescription was added. Make sure you understand how and when to take each.   insulin regular 100 units/mL injection  Commonly known as: NovoLIN R Inject 0-0.15 mLs (0-15 Units total) into the skin 3 (three) times daily before meals. CBG 70 - 120: 0 units CBG 121 - 150: 2 units CBG 151 - 200: 3 units CBG 201 - 250: 5 units CBG 251 - 300: 8 units CBG 301 - 350: 11 units CBG 351 - 400: 15 units   levalbuterol 45 MCG/ACT inhaler Commonly known  as: XOPENEX HFA Inhale 2 puffs into the lungs every 6 (six) hours as needed for wheezing or shortness of breath.   lisinopril 10 MG tablet Commonly known as: ZESTRIL Take 1 tablet (10 mg total) by mouth daily.   metoCLOPramide 10 MG tablet Commonly known as: Reglan Take 1 tablet (10 mg total) by mouth 3 (three) times daily before meals. What changed:  medication strength how much to take   multivitamin with minerals Tabs tablet Take 1 tablet by mouth daily.   ondansetron 4 MG disintegrating tablet Commonly known as: Zofran ODT Take 1 tablet (4 mg total) by mouth every 6 (six) hours as needed for nausea or vomiting.   pantoprazole 40 MG tablet Commonly known as: PROTONIX Take 1 tablet (40 mg total) by mouth daily.   promethazine 12.5 MG tablet Commonly known as: PHENERGAN Take 1 tablet (12.5 mg total) by mouth every 6 (six) hours as needed for nausea or vomiting.   True Metrix Blood Glucose Test test strip Generic drug: glucose blood Use as instructed   True Metrix Meter w/Device Kit 1 Device by Does not apply route 4 (four) times daily.   TRUEplus Lancets 28G Misc assist with checking blood sugar TID and qhs        Follow-up Information     [name], MD. Schedule an appointment as soon as possible for a visit in 1 week(s).   Specialty: Family Medicine Contact information: [place] [address] [phone]                No Known Allergies  Consultations: None   Procedures/Studies: No results found.    Subjective: Patient seen and examined at bedside.  She feels much better and wants to go home.  No overnight fever, vomiting.  Tolerating diet.  Discharge Exam: Vitals:   08/14/20 2215 08/15/20 0357  BP: 109/70 (!) 132/95  Pulse: 91 91  Resp:  18  Temp:  98.3 F (36.8 C)  SpO2:  100%    General: Pt is alert, awake, not in acute distress Cardiovascular: rate controlled, S1/S2 + Respiratory: bilateral  decreased breath sounds at bases Abdominal: Soft, NT, ND, bowel sounds + Extremities: no edema, no cyanosis    The results of significant diagnostics from this hospitalization (including imaging, microbiology, ancillary and laboratory) are listed below for reference.     Microbiology: Recent Results (from the past 240 hour(s))  Resp Panel by RT-PCR (Flu A&B, Covid) Nasopharyngeal Swab     Status: None   Collection Time: 08/12/20  7:07 PM   Specimen: Nasopharyngeal Swab; Nasopharyngeal(NP) swabs in vial transport medium  Result Value Ref Range Status   SARS Coronavirus 2 by RT PCR NEGATIVE NEGATIVE Final    Comment: (NOTE) SARS-CoV-2 target nucleic acids are NOT DETECTED.  The SARS-CoV-2 RNA is generally detectable in upper respiratory specimens during the acute phase of infection. The lowest concentration of SARS-CoV-2 viral copies this assay can detect is 138 copies/mL. A negative result does not preclude SARS-Cov-2 infection and should not be used as the sole basis for  treatment or other patient management decisions. A negative result may occur with  improper specimen collection/handling, submission of specimen other than nasopharyngeal swab, presence of viral mutation(s) within the areas targeted by this assay, and inadequate number of viral copies(<138 copies/mL). A negative result must be combined with clinical observations, patient history, and epidemiological information. The expected result is Negative.  Fact Sheet for Patients:  EntrepreneurPulse.com.au  Fact Sheet for Healthcare Providers:  IncredibleEmployment.be  This test is no t yet approved or cleared by the [place] FDA and  has been authorized for detection and/or diagnosis of SARS-CoV-2 by FDA under an Emergency Use Authorization (EUA). This EUA will remain  in effect (meaning this test can be used) for the duration of the COVID-19 declaration under Section 564(b)(1)  of the Act, 21 U.S.C.section 360bbb-3(b)(1), unless the authorization is terminated  or revoked sooner.       Influenza A by PCR NEGATIVE NEGATIVE Final   Influenza B by PCR NEGATIVE NEGATIVE Final    Comment: (NOTE) The Xpert Xpress SARS-CoV-2/FLU/RSV plus assay is intended as an aid in the diagnosis of influenza from Nasopharyngeal swab specimens and should not be used as a sole basis for treatment. Nasal washings and aspirates are unacceptable for Xpert Xpress SARS-CoV-2/FLU/RSV testing.  Fact Sheet for Patients: EntrepreneurPulse.com.au  Fact Sheet for Healthcare Providers: IncredibleEmployment.be  This test is not yet approved or cleared by the [place] FDA and has been authorized for detection and/or diagnosis of SARS-CoV-2 by FDA under an Emergency Use Authorization (EUA). This EUA will remain in effect (meaning this test can be used) for the duration of the COVID-19 declaration under Section 564(b)(1) of the Act, 21 U.S.C. section 360bbb-3(b)(1), unless the authorization is terminated or revoked.  Performed at [facility] ([place] [place] [address] [place], [place] [address]      Labs: BNP (last 3 results) No results for input(s): BNP in the last 8760 hours. Basic Metabolic Panel: Recent Labs  Lab 08/12/20 1848 08/13/20 0501 08/14/20 0440  NA 137 139 138  K 3.7 4.6 3.8  CL 101 100 106  CO2 22 20* 26  GLUCOSE 319* 369* 167*  BUN 22* 21* 27*  CREATININE 0.96 0.88 0.96  CALCIUM 9.8 10.2 9.3   Liver Function Tests: Recent Labs  Lab 08/12/20 1848  AST 23  ALT 26  ALKPHOS 72  BILITOT 1.1  PROT 7.9  ALBUMIN 4.0   Recent Labs  Lab 08/12/20 1848  LIPASE 21   No results for input(s): AMMONIA in the last 168 hours. CBC: Recent Labs  Lab 08/12/20 1848 08/13/20 0501 08/14/20 0440  WBC 8.5 8.1 12.7*  HGB 11.7* 11.5* 9.9*  HCT 34.8* 35.6* 30.3*  MCV 88.5 90.8 91.5  PLT 397 391 299   Cardiac  Enzymes: No results for input(s): CKTOTAL, CKMB, CKMBINDEX, TROPONINI in the last 168 hours. BNP: Invalid input(s): POCBNP CBG: Recent Labs  Lab 08/14/20 1624 08/14/20 1956 08/14/20 2343 08/15/20 0356 08/15/20 0812  GLUCAP 100* 238* 147* 144* 149*   D-Dimer No results for input(s): DDIMER in the last 72 hours. Hgb A1c No results for input(s): HGBA1C in the last 72 hours. Lipid Profile No results for input(s): CHOL, HDL, LDLCALC, TRIG, CHOLHDL, LDLDIRECT in the last 72 hours. Thyroid function studies No results for input(s): TSH, T4TOTAL, T3FREE, THYROIDAB in the last 72 hours.  Invalid input(s): FREET3 Anemia work up No results for input(s): VITAMINB12, FOLATE, FERRITIN, TIBC, IRON, RETICCTPCT in the last 72 hours. Urinalysis  Component Value Date/Time   COLORURINE YELLOW 07/24/2020 2005   APPEARANCEUR CLEAR 07/24/2020 2005   LABSPEC 1.022 07/24/2020 2005   PHURINE 5.0 07/24/2020 2005   GLUCOSEU >=500 (A) 07/24/2020 2005   GLUCOSEU NEGATIVE 05/22/2020 1417   HGBUR SMALL (A) 07/24/2020 2005   BILIRUBINUR NEGATIVE 07/24/2020 2005   KETONESUR 80 (A) 07/24/2020 2005   PROTEINUR 100 (A) 07/24/2020 2005   UROBILINOGEN 0.2 05/22/2020 1417   NITRITE NEGATIVE 07/24/2020 2005   LEUKOCYTESUR NEGATIVE 07/24/2020 2005   Sepsis Labs Invalid input(s): PROCALCITONIN,  WBC,  LACTICIDVEN Microbiology Recent Results (from the past 240 hour(s))  Resp Panel by RT-PCR (Flu A&B, Covid) Nasopharyngeal Swab     Status: None   Collection Time: 08/12/20  7:07 PM   Specimen: Nasopharyngeal Swab; Nasopharyngeal(NP) swabs in vial transport medium  Result Value Ref Range Status   SARS Coronavirus 2 by RT PCR NEGATIVE NEGATIVE Final    Comment: (NOTE) SARS-CoV-2 target nucleic acids are NOT DETECTED.  The SARS-CoV-2 RNA is generally detectable in upper respiratory specimens during the acute phase of infection. The lowest concentration of SARS-CoV-2 viral copies this assay can detect  is 138 copies/mL. A negative result does not preclude SARS-Cov-2 infection and should not be used as the sole basis for treatment or other patient management decisions. A negative result may occur with  improper specimen collection/handling, submission of specimen other than nasopharyngeal swab, presence of viral mutation(s) within the areas targeted by this assay, and inadequate number of viral copies(<138 copies/mL). A negative result must be combined with clinical observations, patient history, and epidemiological information. The expected result is Negative.  Fact Sheet for Patients:  EntrepreneurPulse.com.au  Fact Sheet for Healthcare Providers:  IncredibleEmployment.be  This test is no t yet approved or cleared by the [place] FDA and  has been authorized for detection and/or diagnosis of SARS-CoV-2 by FDA under an Emergency Use Authorization (EUA). This EUA will remain  in effect (meaning this test can be used) for the duration of the COVID-19 declaration under Section 564(b)(1) of the Act, 21 U.S.C.section 360bbb-3(b)(1), unless the authorization is terminated  or revoked sooner.       Influenza A by PCR NEGATIVE NEGATIVE Final   Influenza B by PCR NEGATIVE NEGATIVE Final    Comment: (NOTE) The Xpert Xpress SARS-CoV-2/FLU/RSV plus assay is intended as an aid in the diagnosis of influenza from Nasopharyngeal swab specimens and should not be used as a sole basis for treatment. Nasal washings and aspirates are unacceptable for Xpert Xpress SARS-CoV-2/FLU/RSV testing.  Fact Sheet for Patients: EntrepreneurPulse.com.au  Fact Sheet for Healthcare Providers: IncredibleEmployment.be  This test is not yet approved or cleared by the [place] FDA and has been authorized for detection and/or diagnosis of SARS-CoV-2 by FDA under an Emergency Use Authorization (EUA). This EUA will remain in effect  (meaning this test can be used) for the duration of the COVID-19 declaration under Section 564(b)(1) of the Act, 21 U.S.C. section 360bbb-3(b)(1), unless the authorization is terminated or revoked.  Performed at [facility], [address], [place], [place] [address]      Time coordinating discharge: 35 minutes  SIGNED:   Aline August, MD  [facility] 08/15/2020, 1:19 PM

## 2020-08-16 ENCOUNTER — Telehealth: Payer: 59

## 2020-08-22 ENCOUNTER — Telehealth: Payer: Self-pay

## 2020-08-22 ENCOUNTER — Telehealth: Payer: 59

## 2020-08-22 NOTE — Telephone Encounter (Addendum)
  Care Management   Follow Up Note   08/22/2020 Name: Kelli Hudson MRN: [id number] DOB: 02-10-1991   Referred by: Loyola Mast, MD Reason for referral : Care Coordination (Follow up DMII, Gastroparesis)   An unsuccessful telephone outreach was attempted today. The patient was referred to the case management team for assistance with care management and care coordination.  Patient states she is in the middle of something and request call on another day.   Follow Up Plan: RNCM rescheduled patient's follow up appointment for 08/29/2020.    George Ina RN,BSN,CCM RN Case Manager [facility] [place] [phone]

## 2020-08-29 ENCOUNTER — Ambulatory Visit: Payer: 59

## 2020-08-29 DIAGNOSIS — K3184 Gastroparesis: Secondary | ICD-10-CM

## 2020-08-29 DIAGNOSIS — E1165 Type 2 diabetes mellitus with hyperglycemia: Secondary | ICD-10-CM

## 2020-08-29 NOTE — Patient Instructions (Addendum)
Visit Information:  Thank you for taking the time to speak with me today.    Goals Addressed             This Visit's Progress    Monitor and manage blood sugars and gastroparesis symptoms   Not on track    Timeframe:  Long-Range Goal Priority:  High Start Date:    06/13/2020                        Expected End Date:  11/16/2020                   Follow Up Date 10/03/2020   -  Continue to check blood sugar at prescribed times - Continue to check blood sugar if I feel it is too high or too low - take the blood sugar log and/ or meter to all doctor visits -  Stay hydrated: drink 6 to 8 glasses of water each day - eat small, frequent meals,  Avoid raw and uncooked fruits and vegetables, fibrous fruits and vegetables - gentle exercise following meals, such as walking - Continue to follow up with your doctors as recommended.  - Reschedule follow up visit with your endocrinologist. (If you need assistance please contact your RN case manager [phone] to assist you)    Why is this important?   Checking your blood sugar at home helps to keep it from getting very high or very low.  Writing the results in a diary or log helps the doctor know how to care for you.  Your blood sugar log should have the time, date and the results.  Also, write down the amount of insulin or other medicine that you take.  Other information, like what you ate, exercise done and how you were feeling, will also be helpful.             Patient verbalizes understanding of instructions provided today and agrees to view in MyChart.   The patient has been provided with contact information for the care management team and has been advised to call with any health related questions or concerns.  The care management team will reach out to the patient again over the next 30 days.   George Ina RN,BSN,CCM RN Case Manager [facility] [place]  [phone]

## 2020-08-29 NOTE — Chronic Care Management (AMB) (Signed)
Chronic Care Management   CCM RN Visit Note  08/31/2020 Name: Kelli Hudson MRN: [id number] DOB: [birth year]  Subjective: Kelli Hudson is a 30 y.o. year old female who is a primary care patient of Rudd, Lillette Boxer, MD. The care management team was consulted for assistance with disease management and care coordination needs.    Engaged with patient by telephone for follow up visit in response to provider referral for case management and/or care coordination services.   Consent to Services:  The patient was given information about Chronic Care Management services, agreed to services, and gave verbal consent prior to initiation of services.  Please see initial visit note for detailed documentation.   Patient agreed to services and verbal consent obtained.   Assessment: Review of patient past medical history, allergies, medications, health status, including review of consultants reports, laboratory and other test data, was performed as part of comprehensive evaluation and provision of chronic care management services.   SDOH (Social Determinants of Health) assessments and interventions performed:    CCM Care Plan  No Known Allergies  Outpatient Encounter Medications as of 08/29/2020  Medication Sig Note   atenolol (TENORMIN) 25 MG tablet Take 1 tablet (25 mg total) by mouth 2 (two) times daily.    erythromycin (E-MYCIN) 250 MG tablet Take 250 mg by mouth 2 (two) times daily. 08/12/2020: Ongoing    hyoscyamine (LEVBID) 0.375 MG 12 hr tablet Take 1 tablet (0.375 mg total) by mouth every 12 (twelve) hours as needed for cramping (abdominal pain).    insulin glargine (LANTUS) 100 UNIT/ML injection Inject 0.18 mLs (18 Units total) into the skin daily.    insulin regular (NOVOLIN R) 100 units/mL injection Inject 0-0.15 mLs (0-15 Units total) into the skin 3 (three) times daily before meals. CBG 70 - 120: 0 units CBG 121 - 150: 2 units CBG 151 - 200: 3 units CBG 201 - 250: 5  units CBG 251 - 300: 8 units CBG 301 - 350: 11 units CBG 351 - 400: 15 units    levalbuterol (XOPENEX HFA) 45 MCG/ACT inhaler Inhale 2 puffs into the lungs every 6 (six) hours as needed for wheezing or shortness of breath.    lisinopril (ZESTRIL) 10 MG tablet Take 1 tablet (10 mg total) by mouth daily.    metoCLOPramide (REGLAN) 10 MG tablet Take 1 tablet (10 mg total) by mouth 3 (three) times daily before meals.    Multiple Vitamin (MULTIVITAMIN WITH MINERALS) TABS tablet Take 1 tablet by mouth daily.    ondansetron (ZOFRAN ODT) 4 MG disintegrating tablet Take 1 tablet (4 mg total) by mouth every 6 (six) hours as needed for nausea or vomiting.    pantoprazole (PROTONIX) 40 MG tablet Take 1 tablet (40 mg total) by mouth daily.    promethazine (PHENERGAN) 12.5 MG tablet Take 1 tablet (12.5 mg total) by mouth every 6 (six) hours as needed for nausea or vomiting.    Blood Glucose Monitoring Suppl (TRUE METRIX METER) w/Device KIT 1 Device by Does not apply route 4 (four) times daily.    glucose blood (TRUE METRIX BLOOD GLUCOSE TEST) test strip Use as instructed    Insulin Pen Needle 31G X 5 MM MISC 1 Device by Does not apply route QID. For use with insulin pens    Insulin Pen Needle 31G X 5 MM MISC Lantus 18 units daily    TRUEPLUS LANCETS 28G MISC assist with checking blood sugar TID and qhs    [DISCONTINUED] insulin aspart (NOVOLOG)  100 UNIT/ML FlexPen Inject 5 Units into the skin 3 (three) times daily with meals.    No facility-administered encounter medications on file as of 08/29/2020.    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 pylori infection 10/07/2019   Depression with anxiety 07/05/2019   Diabetic foot ulcer ([place]) 02/21/2018   Diabetic polyneuropathy associated with type 2 diabetes mellitus ([place]) 09/24/2017   Pain in right foot 09/24/2017   Gastroparesis    Leukocytosis    Increased frequency of urination 06/14/2011   UTI (urinary tract infection) 06/14/2011   Goiter 10/05/2009   Uncontrolled diabetes mellitus ([place]) 10/05/2009   Essential hypertension 10/05/2009    Conditions to be addressed/monitored:DMII and Gastroparesis  Care Plan : Diabetes Type 2 (Adult)  Updates made by Dannielle Karvonen, RN since 08/31/2020 12:00 AM   Problem: Knowledge deficit related to long term self health management of diabetes with gastroparesis.   Priority: High   Long-Range Goal: Disease progression minimized   Start Date: 06/13/2020  Expected End Date: 11/16/2020  This Visit's Progress: Not on track  Recent Progress: On track  Priority: High  Objective:  Lab Results  Component Value Date   HGBA1C 10.2 (H) 06/27/2020   Lab Results  Component Value Date   CREATININE 0.96 08/14/2020   CREATININE 0.88 08/13/2020   CREATININE 0.96 08/12/2020  Current Barriers:  Knowledge Deficits related to basic Diabetes pathophysiology and self care/management:  Patient with recent hospital stay from 08/12/2020 to 08/15/2020 for gastroparesis.  Patient reports she is scheduled for a follow up with her Gastroenterologist on 09/26/2020.  Patient states, "I am still trying to determine what my triggers are regarding my stomach issues."  Patient states she adhering to a strict diet. She states she is not eating high fiber foods and beef.  Patient states she is  also trying to get her Hgb. A1c down.  Patients most recent A1c being 10.2 down from last A1c of 11.5.   Patient reports she was told in the hospital to follow up with an endocrinologist and nutritionist.  RNCM offered to follow up with patients primary care provider regarding request.  Patient verbalized agreement. RNCM advised patient to re-start her food diary.   Patient states She continues to  check her blood sugars 2-3 times per day and reports blood sugars ranging from 100 to 300's.    Case Manager Clinical Goal(s):  patient will demonstrate improved adherence to prescribed treatment plan for diabetes self care/management as evidenced by: daily monitoring and recording of CBG  adherence to ADA/ carb modified diet adherence to prescribed medication regimen contacting provider for new or worsened symptoms or questions Patient will schedule and attend appointment with endocrinologist Patient will report scheduling appointment with nutritionist Interventions:  Collaboration with Gena Fray Lillette Boxer, MD regarding development and update of comprehensive plan of care as evidenced by provider attestation and co-signature:  RNCM spoke with Dr.  Rudd and Inocente Salles, office supervisor regarding patients nutrition referral.  Dr. Gena Fray referred patient to nutritionist on 05/22/2020, appointment was made but patient had to cancel due to being hospitalized.   Drue Dun was able to resend referral to the nutritionist for rescheduling.  Inter-disciplinary care team collaboration (see longitudinal plan of care): RNCM will encourage and assist patient with re-establishing with endocrinologist.  Reviewed medications with patient and discussed importance of medication adherence Discussed plans with patient for ongoing care management follow up and provided patient with direct contact information for care management team Provided patient with written educational materials related to hypo and hyperglycemia and importance of  correct treatment Reviewed scheduled/upcoming provider appointments including: Follow up with Gastroenterologist is 09/26/2020. Provided patient with written education material related to gastroparesis. Referral made to pharmacy team for medication assistance with Novolog Self-Care Activities - Self administers insulin as prescribed Attends all scheduled provider appointments Checks blood sugars as prescribed and utilize hyper and hypoglycemia protocol as needed Adheres to prescribed ADA/carb modified Patient Goals: -  Continue to check blood sugar at prescribed times - Continue to check blood sugar if I feel it is too high or too low - take the blood sugar log and/ or meter to all doctor visits -  Stay hydrated: drink 6 to 8 glasses of water each day - eat small, frequent meals,  Avoid raw and uncooked fruits and vegetables, fibrous fruits and vegetables - gentle exercise following meals, such as walking - Continue to follow up with your doctors as recommended.  - Reschedule follow up visit with your endocrinologist. (If you need assistance please contact your RN case manager [phone] to assist you) Follow Up Plan: The patient has been provided with contact information for the care management team and has been advised to call with any health related questions or concerns.  The care management team will reach out to the patient again over the next 30 days.        Plan:The patient has been provided with contact information for the care management team and has been advised to call with any health related questions or concerns.  and The care management team will reach out to the patient again over the next 30 days. Quinn Plowman RN,BSN,CCM RN Case Manager [place]  [phone]

## 2020-08-30 ENCOUNTER — Encounter: Payer: Self-pay | Admitting: Family Medicine

## 2020-08-30 DIAGNOSIS — E0865 Diabetes mellitus due to underlying condition with hyperglycemia: Secondary | ICD-10-CM

## 2020-08-30 DIAGNOSIS — I10 Essential (primary) hypertension: Secondary | ICD-10-CM

## 2020-09-03 MED ORDER — TRUE METRIX BLOOD GLUCOSE TEST VI STRP: ORAL_STRIP | 12 refills | Status: DC

## 2020-09-05 ENCOUNTER — Ambulatory Visit: Payer: 59

## 2020-09-05 DIAGNOSIS — IMO0002 Reserved for concepts with insufficient information to code with codable children: Secondary | ICD-10-CM

## 2020-09-05 DIAGNOSIS — E1165 Type 2 diabetes mellitus with hyperglycemia: Secondary | ICD-10-CM

## 2020-09-05 DIAGNOSIS — K3184 Gastroparesis: Secondary | ICD-10-CM

## 2020-09-05 NOTE — Chronic Care Management (AMB) (Signed)
Chronic Care Management   CCM RN Visit Note  09/05/2020 Name: Kelli Hudson MRN: [id number] DOB: Jul 29, 1990  Subjective: Kelli Hudson is a 30 y.o. year old female who is a primary care patient of Rudd, Lillette Boxer, MD. The care management team was consulted for assistance with disease management and care coordination needs.    Engaged with patient by telephone for follow up visit in response to provider referral for case management and/or care coordination services.   Consent to Services:  The patient was given information about Chronic Care Management services, agreed to services, and gave verbal consent prior to initiation of services.  Please see initial visit note for detailed documentation.   Patient agreed to services and verbal consent obtained.   Assessment: Review of patient past medical history, allergies, medications, health status, including review of consultants reports, laboratory and other test data, was performed as part of comprehensive evaluation and provision of chronic care management services.   SDOH (Social Determinants of Health) assessments and interventions performed:    CCM Care Plan  No Known Allergies  Outpatient Encounter Medications as of 09/05/2020  Medication Sig Note   atenolol (TENORMIN) 25 MG tablet Take 1 tablet (25 mg total) by mouth 2 (two) times daily.    Blood Glucose Monitoring Suppl (TRUE METRIX METER) w/Device KIT 1 Device by Does not apply route 4 (four) times daily.    erythromycin (E-MYCIN) 250 MG tablet Take 250 mg by mouth 2 (two) times daily. 08/12/2020: Ongoing    glucose blood (TRUE METRIX BLOOD GLUCOSE TEST) test strip Use as instructed    hyoscyamine (LEVBID) 0.375 MG 12 hr tablet Take 1 tablet (0.375 mg total) by mouth every 12 (twelve) hours as needed for cramping (abdominal pain).    insulin glargine (LANTUS) 100 UNIT/ML injection Inject 0.18 mLs (18 Units total) into the skin daily.    Insulin Pen Needle 31G X 5 MM  MISC 1 Device by Does not apply route QID. For use with insulin pens    Insulin Pen Needle 31G X 5 MM MISC Lantus 18 units daily    insulin regular (NOVOLIN R) 100 units/mL injection Inject 0-0.15 mLs (0-15 Units total) into the skin 3 (three) times daily before meals. CBG 70 - 120: 0 units CBG 121 - 150: 2 units CBG 151 - 200: 3 units CBG 201 - 250: 5 units CBG 251 - 300: 8 units CBG 301 - 350: 11 units CBG 351 - 400: 15 units    levalbuterol (XOPENEX HFA) 45 MCG/ACT inhaler Inhale 2 puffs into the lungs every 6 (six) hours as needed for wheezing or shortness of breath.    lisinopril (ZESTRIL) 10 MG tablet Take 1 tablet (10 mg total) by mouth daily.    metoCLOPramide (REGLAN) 10 MG tablet Take 1 tablet (10 mg total) by mouth 3 (three) times daily before meals.    Multiple Vitamin (MULTIVITAMIN WITH MINERALS) TABS tablet Take 1 tablet by mouth daily.    ondansetron (ZOFRAN ODT) 4 MG disintegrating tablet Take 1 tablet (4 mg total) by mouth every 6 (six) hours as needed for nausea or vomiting.    pantoprazole (PROTONIX) 40 MG tablet Take 1 tablet (40 mg total) by mouth daily.    promethazine (PHENERGAN) 12.5 MG tablet Take 1 tablet (12.5 mg total) by mouth every 6 (six) hours as needed for nausea or vomiting.    TRUEPLUS LANCETS 28G MISC assist with checking blood sugar TID and qhs    [DISCONTINUED] insulin aspart (NOVOLOG)  100 UNIT/ML FlexPen Inject 5 Units into the skin 3 (three) times daily with meals.    No facility-administered encounter medications on file as of 09/05/2020.    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 pylori infection 10/07/2019   Depression with anxiety 07/05/2019   Diabetic foot ulcer ([place]) 02/21/2018   Diabetic polyneuropathy associated with type 2 diabetes mellitus ([place]) 09/24/2017   Pain in right foot 09/24/2017   Gastroparesis    Leukocytosis    Increased frequency of urination 06/14/2011   UTI (urinary tract infection) 06/14/2011   Goiter 10/05/2009   Uncontrolled diabetes mellitus ([place]) 10/05/2009   Essential hypertension 10/05/2009    Conditions to be addressed/monitored:DMII and Gastroparesis  Care Plan : Diabetes Type 2 (Adult)  Updates made by Dannielle Karvonen, RN since 09/05/2020 12:00 AM   Problem: Knowledge deficit related to long term self health management of diabetes with gastroparesis.   Priority: High   Long-Range Goal: Disease progression minimized   Start Date: 06/13/2020  Expected End Date: 12/17/2020  This Visit's Progress: On track  Recent Progress: Not on track  Priority: High  Objective:  Lab Results  Component Value Date   HGBA1C 10.2 (H) 06/27/2020   Lab Results  Component Value Date   CREATININE 0.96 08/14/2020   CREATININE 0.88 08/13/2020   CREATININE 0.96 08/12/2020  Current Barriers:  Knowledge Deficits related to basic Diabetes pathophysiology and self care/management:  RNCM contacted [facility] [place] and diabetes and spoke with [name].  Confirmed patient has appointment scheduled with nutritionist on 09/24/2020 at 10 am with [name].  Confirmed patient aware of appointment scheduled.  Patient states she contacted the endocrinology office and is currently waiting  forms to complete.  Patient advised to call RNCM if she needs assistance with securing appointment.  Patient verbalized understanding and agreement. Patient states she is doing well and is back to work today.   Case Manager Clinical Goal(s):  patient will demonstrate improved adherence to prescribed treatment plan for diabetes self care/management as evidenced by: daily monitoring and recording of CBG  adherence to ADA/ carb modified diet adherence to prescribed medication regimen contacting provider for new or worsened symptoms or questions Patient will schedule and attend appointment with endocrinologist Patient will report scheduling appointment with nutritionist Interventions:  Collaboration with Gena Fray, Lillette Boxer, MD regarding development and update of comprehensive plan of care as evidenced by provider attestation and co-signature:   Inter-disciplinary care team collaboration (see longitudinal plan of care):  Reviewed medications with patient and discussed importance of medication adherence Discussed plans with patient for ongoing care management follow up and provided patient with direct contact information for care management team Provided patient with written educational materials related to hypo and hyperglycemia and importance of correct treatment Reviewed scheduled/upcoming provider appointments including:  Provided patient with written education material related to gastroparesis. Referral made to pharmacy team for medication assistance  with Novolog Self-Care Activities - Self administers insulin as prescribed Attends all scheduled provider appointments Checks blood sugars as prescribed and utilize hyper and hypoglycemia protocol as needed Adheres to prescribed ADA/carb modified Patient Goals: -  Continue to check blood sugar at prescribed times - Continue to check blood sugar if I feel it is too high or too low - take the blood sugar log and/ or meter to all doctor visits -  Stay hydrated: drink 6 to 8 glasses of water each day - eat small, frequent meals,  Avoid raw and uncooked  fruits and vegetables, fibrous fruits and vegetables - gentle exercise following meals, such as walking - Continue to follow up with your doctors as recommended.  - Reschedule follow up visit with your endocrinologist. (If you need assistance please contact your RN case manager [phone] to assist you) - Attend nutritionist visit scheduled for September 24, 2020 at 10am  Follow Up Plan: The patient has been provided with contact information for the care management team and has been advised to call with any health related questions or concerns.  The care management team will reach out to the patient again over the next 30 days.        Plan:The patient has been provided with contact information for the care management team and has been advised to call with any health related questions or concerns.  and The care management team will reach out to the patient again over the next 45 days. Quinn Plowman RN,BSN,CCM RN Case Manager [place]  [phone]

## 2020-09-05 NOTE — Patient Instructions (Signed)
Visit Information:  Thank you for taking the time to speak with me today.    Goals Addressed             This Visit's Progress    Monitor and manage blood sugars and gastroparesis symptoms       Timeframe:  Long-Range Goal Priority:  High Start Date:    06/13/2020                        Expected End Date:  11/16/2020                   Follow Up Date 10/03/2020    -  Continue to check blood sugar at prescribed times - Continue to check blood sugar if I feel it is too high or too low - take the blood sugar log and/ or meter to all doctor visits -  Stay hydrated: drink 6 to 8 glasses of water each day - eat small, frequent meals,  Avoid raw and uncooked fruits and vegetables, fibrous fruits and vegetables - gentle exercise following meals, such as walking - Continue to follow up with your doctors as recommended.  - Reschedule follow up visit with your endocrinologist. (If you need assistance please contact your RN case manager [phone] to assist you) - Attend nutritionist visit scheduled for September 24, 2020 at 10am    Why is this important?   Checking your blood sugar at home helps to keep it from getting very high or very low.  Writing the results in a diary or log helps the doctor know how to care for you.  Your blood sugar log should have the time, date and the results.  Also, write down the amount of insulin or other medicine that you take.  Other information, like what you ate, exercise done and how you were feeling, will also be helpful.             Patient verbalizes understanding of instructions provided today and agrees to view in MyChart.   The patient has been provided with contact information for the care management team and has been advised to call with any health related questions or concerns.  The care management team will reach out to the patient again over the next 45 days.   George Ina RN,BSN,CCM RN Case Manager [facility] [place]  [phone]

## 2020-09-07 ENCOUNTER — Emergency Department (HOSPITAL_COMMUNITY)
Admission: EM | Admit: 2020-09-07 | Discharge: 2020-09-08 | Disposition: A | Discharge status: Home / Self Care | Payer: 59 | Source: Home / Self Care | Attending: Emergency Medicine | Admitting: Emergency Medicine

## 2020-09-07 ENCOUNTER — Other Ambulatory Visit: Payer: Self-pay

## 2020-09-07 DIAGNOSIS — E1043 Type 1 diabetes mellitus with diabetic autonomic (poly)neuropathy: Secondary | ICD-10-CM | POA: Diagnosis not present

## 2020-09-07 DIAGNOSIS — K3184 Gastroparesis: Secondary | ICD-10-CM | POA: Insufficient documentation

## 2020-09-07 DIAGNOSIS — I10 Essential (primary) hypertension: Secondary | ICD-10-CM | POA: Insufficient documentation

## 2020-09-07 DIAGNOSIS — N9489 Other specified conditions associated with female genital organs and menstrual cycle: Secondary | ICD-10-CM | POA: Insufficient documentation

## 2020-09-07 DIAGNOSIS — Z20822 Contact with and (suspected) exposure to covid-19: Secondary | ICD-10-CM | POA: Insufficient documentation

## 2020-09-07 DIAGNOSIS — E1142 Type 2 diabetes mellitus with diabetic polyneuropathy: Secondary | ICD-10-CM | POA: Insufficient documentation

## 2020-09-07 DIAGNOSIS — K219 Gastro-esophageal reflux disease without esophagitis: Secondary | ICD-10-CM | POA: Insufficient documentation

## 2020-09-07 LAB — CBC WITH DIFFERENTIAL/PLATELET
Abs Immature Granulocytes: 0.02 10*3/uL (ref 0.00–0.07)
Basophils Absolute: 0 10*3/uL (ref 0.0–0.1)
Basophils Relative: 0 %
Eosinophils Absolute: 0 10*3/uL (ref 0.0–0.5)
Eosinophils Relative: 0 %
HCT: 35.9 % — ABNORMAL LOW (ref 36.0–46.0)
Hemoglobin: 11.9 g/dL — ABNORMAL LOW (ref 12.0–15.0)
Immature Granulocytes: 0 %
Lymphocytes Relative: 23 %
Lymphs Abs: 1.6 10*3/uL (ref 0.7–4.0)
MCH: 29.5 pg (ref 26.0–34.0)
MCHC: 33.1 g/dL (ref 30.0–36.0)
MCV: 88.9 fL (ref 80.0–100.0)
Monocytes Absolute: 0.4 10*3/uL (ref 0.1–1.0)
Monocytes Relative: 6 %
Neutro Abs: 4.8 10*3/uL (ref 1.7–7.7)
Neutrophils Relative %: 71 %
Platelets: 395 10*3/uL (ref 150–400)
RBC: 4.04 MIL/uL (ref 3.87–5.11)
RDW: 13 % (ref 11.5–15.5)
WBC: 6.8 10*3/uL (ref 4.0–10.5)
nRBC: 0 % (ref 0.0–0.2)

## 2020-09-07 LAB — COMPREHENSIVE METABOLIC PANEL
ALT: 30 U/L (ref 0–44)
AST: 26 U/L (ref 15–41)
Albumin: 4.1 g/dL (ref 3.5–5.0)
Alkaline Phosphatase: 83 U/L (ref 38–126)
Anion gap: 12 (ref 5–15)
BUN: 24 mg/dL — ABNORMAL HIGH (ref 6–20)
CO2: 23 mmol/L (ref 22–32)
Calcium: 10 mg/dL (ref 8.9–10.3)
Chloride: 100 mmol/L (ref 98–111)
Creatinine, Ser: 1.15 mg/dL — ABNORMAL HIGH (ref 0.44–1.00)
GFR, Estimated: >60 mL/min (ref >60)
Glucose, Bld: 342 mg/dL — ABNORMAL HIGH (ref 70–99)
Potassium: 4.4 mmol/L (ref 3.5–5.1)
Sodium: 135 mmol/L (ref 135–145)
Total Bilirubin: 1.1 mg/dL (ref 0.3–1.2)
Total Protein: 7.9 g/dL (ref 6.5–8.1)

## 2020-09-07 LAB — LIPASE, BLOOD: Lipase: 24 U/L (ref 11–51)

## 2020-09-07 LAB — I-STAT BETA HCG BLOOD, ED (MC, WL, AP ONLY): I-stat hCG, quantitative: <5 m[IU]/mL (ref <5)

## 2020-09-07 MED ORDER — METOCLOPRAMIDE HCL 5 MG/ML IJ SOLN
10.0000 mg | Freq: Once | INTRAMUSCULAR | Status: AC
  Administered 2020-09-07: 10 mg via INTRAVENOUS
  Filled 2020-09-07: qty 2

## 2020-09-07 MED ORDER — SODIUM CHLORIDE 0.9 % IV BOLUS
1000.0000 mL | Freq: Once | INTRAVENOUS | Status: AC
  Administered 2020-09-07: 1000 mL via INTRAVENOUS

## 2020-09-07 MED ORDER — ONDANSETRON 4 MG PO TBDP
4.0000 mg | ORAL_TABLET | Freq: Once | ORAL | Status: AC
  Administered 2020-09-07: 4 mg via ORAL
  Filled 2020-09-07: qty 1

## 2020-09-07 MED ORDER — PANTOPRAZOLE SODIUM 40 MG IV SOLR
40.0000 mg | Freq: Once | INTRAVENOUS | Status: AC
  Administered 2020-09-07: 40 mg via INTRAVENOUS
  Filled 2020-09-07: qty 40

## 2020-09-07 MED ORDER — HYDROMORPHONE HCL 1 MG/ML IJ SOLN
1.0000 mg | Freq: Once | INTRAMUSCULAR | Status: AC
  Administered 2020-09-07: 1 mg via INTRAVENOUS
  Filled 2020-09-07: qty 1

## 2020-09-07 NOTE — ED Provider Notes (Signed)
Emergency Medicine Provider Triage Evaluation Note  Kerrin Markman , a 30 y.o. female  was evaluated in triage.  Pt complains of emesis.  Began earlier today.  History of gastroparesis and states feels similar.  Unable to keep down p.o.  Some generalized abdominal aching.  Denies chance of pregnancy.  No urinary complaints.  Review of Systems  Positive: Emesis Negative: Dysuria, fever, chills  Physical Exam  There were no vitals taken for this visit. Gen:   Awake, no distress   Resp:  Normal effort  MSK:   Moves extremities without difficulty  Other:  Active emesis in room  Medical Decision Making  Medically screening exam initiated at 2:26 PM.  Appropriate orders placed.  Kyleena Scheirer was informed that the remainder of the evaluation will be completed by another provider, this initial triage assessment does not replace that evaluation, and the importance of remaining in the ED until their evaluation is complete.  Emesis   Monasia Lair A, PA-C 09/07/20 1427    Ernie Avena, MD 09/08/20 1447

## 2020-09-07 NOTE — ED Triage Notes (Signed)
Patient c/o mid abdominal pain and N/v that started this afternoon. Patient denies any diarrhea.

## 2020-09-07 NOTE — ED Provider Notes (Signed)
Emergency Department Provider Note   I have reviewed the triage vital signs and the nursing notes.   HISTORY  Chief Complaint Abdominal Pain and Emesis   HPI Kelli Hudson is a 30 y.o. female with PMH of DM, Gastroparesis, HTN, and GERD returns to the emergency department with nausea and vomiting.  She is having associated abdominal cramping and some chest discomfort with vomiting which is typical of her gastroparesis symptoms.  She states her blood sugars have been well controlled at home.  She denies any alcohol or drug use including marijuana.  She is not having fevers.  She is not feeling short of breath.  She recovered from COVID in April and has not having any upper respiratory infection symptoms.  She is tried nausea medications at home with no relief.  With recurrent vomiting and pain she presents for evaluation and treatment.   Past Medical History:  Diagnosis Date   Acute H. 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 pylori infection 10/07/2019   Depression with anxiety 07/05/2019   Diabetic foot ulcer (HCC) 02/21/2018   Diabetic polyneuropathy  associated with type 2 diabetes mellitus (HCC) 09/24/2017   Pain in right foot 09/24/2017   Gastroparesis    Leukocytosis    Increased frequency of urination 06/14/2011   UTI (urinary tract infection) 06/14/2011   Goiter 10/05/2009   Uncontrolled diabetes mellitus (HCC) 10/05/2009   Essential hypertension 10/05/2009    Past Surgical History:  Procedure Laterality Date   AMPUTATION TOE Left 03/10/2018   Procedure: AMPUTATION FIFTH TOE;  Surgeon: Vivi Barrack, DPM;  Location: [facility];  Service: Podiatry;  Laterality: Left;   BIOPSY  01/28/2020   Procedure: BIOPSY;  Surgeon: Kathi Der, MD;  Location: [facility];  Service: Gastroenterology;;   ESOPHAGOGASTRODUODENOSCOPY N/A 01/28/2020   Procedure: ESOPHAGOGASTRODUODENOSCOPY (EGD);  Surgeon: Kathi Der, MD;  Location: [facility];  Service: Gastroenterology;  Laterality: N/A;   ESOPHAGOGASTRODUODENOSCOPY (EGD) WITH PROPOFOL Left 09/08/2015   Procedure: ESOPHAGOGASTRODUODENOSCOPY (EGD) WITH PROPOFOL;  Surgeon: Willis Modena, MD;  Location: [facility];  Service: Endoscopy;  Laterality: Left;   WISDOM TOOTH EXTRACTION      Allergies Patient has no known allergies.  Family History  Problem Relation Age of Onset   Lung cancer Mother    Diabetes Mother    Bipolar disorder Father    Liver cancer Maternal Grandfather    Breast cancer Other        maternal great aunt   Heart disease Other    Ovarian cancer Other        maternal great aunt   Pancreatic cancer Maternal Uncle    Prostate cancer Maternal Uncle  Diabetes Maternal Aunt        x 2   Kidney disease Other        maternal great aunt    Social History Social History   Tobacco Use   Smoking status: Never   Smokeless tobacco: Never  Vaping Use   Vaping Use: Never used  Substance Use Topics   Alcohol use: Yes    Alcohol/week: 1.0 standard drink    Types: 1 Shots of liquor per week    Comment: occasional    Drug use: No     Review of Systems  Constitutional: No fever/chills Eyes: No visual changes. ENT: No sore throat. Cardiovascular: Denies chest pain. Respiratory: Denies shortness of breath. Gastrointestinal: Positive abdominal pain. Positive nausea and vomiting.  No diarrhea.  No constipation. Genitourinary: Negative for dysuria. Musculoskeletal: Negative for back pain. Skin: Negative for rash. Neurological: Negative for headaches, focal weakness or numbness.  10-point ROS otherwise negative.  ____________________________________________   PHYSICAL EXAM:  VITAL SIGNS: ED Triage Vitals  Enc Vitals Group     BP 09/07/20 1428 (!) 160/110     Pulse Rate 09/07/20 1428 (!) 128     Resp 09/07/20 1428 18     Temp 09/07/20 1428 98.7 F (37.1 C)     Temp Source 09/07/20 1428 Oral     SpO2 09/07/20 1428 100 %     Weight 09/07/20 1431 165 lb (74.8 kg)     Height 09/07/20 1431 5\' 8"  (1.727 m)   Constitutional: Alert and oriented. Well appearing and in no acute distress. Eyes: Conjunctivae are normal.  Head: Atraumatic. Nose: No congestion/rhinnorhea. Mouth/Throat: Mucous membranes are dry.  Neck: No stridor.   Cardiovascular: Tachycardia. Good peripheral circulation. Grossly normal heart sounds.   Respiratory: Normal respiratory effort.  No retractions. Lungs CTAB. Gastrointestinal: Soft and nontender. No distention.  Musculoskeletal: No lower extremity tenderness nor edema. No gross deformities of extremities. Neurologic:  Normal speech and language.  Skin:  Skin is warm, dry and intact. No rash noted.  ____________________________________________   LABS (all labs ordered are listed, but only abnormal results are displayed)  Labs Reviewed  CBC WITH DIFFERENTIAL/PLATELET - Abnormal; Notable for the following components:      Result Value   Hemoglobin 11.9 (*)    HCT 35.9 (*)    All other components within normal limits  COMPREHENSIVE METABOLIC PANEL - Abnormal; Notable for the  following components:   Glucose, Bld 342 (*)    BUN 24 (*)    Creatinine, Ser 1.15 (*)    All other components within normal limits  CBG MONITORING, ED - Abnormal; Notable for the following components:   Glucose-Capillary 317 (*)    All other components within normal limits  RESP PANEL BY RT-PCR (FLU A&B, COVID) ARPGX2  LIPASE, BLOOD  I-STAT BETA HCG BLOOD, ED ([facility], [facility], [facility] ONLY)   ____________________________________________  EKG   EKG Interpretation  Date/Time:  Friday September 07 2020 23:36:51 EDT Ventricular Rate:  143 PR Interval:  174 QRS Duration: 73 QT Interval:  280 QTC Calculation: 432 R Axis:   60 Text Interpretation: Sinus tachycardia LAE, consider biatrial enlargement Nonspecific T abnormalities, lateral leads Baseline wander in lead(s) V5 Confirmed by [id number] [phone]) on 09/07/2020 11:42:08 PM        ____________________________________________  RADIOLOGY  None   ____________________________________________   PROCEDURES  Procedure(s) performed:   Procedures  None  ____________________________________________   INITIAL IMPRESSION / ASSESSMENT AND PLAN / ED COURSE  Pertinent  labs & imaging results that were available during my care of the patient were reviewed by me and considered in my medical decision making (see chart for details).   Patient presents to the emergency department with nausea, vomiting, abdominal pain.  She has multiple ED presentations for this in the past associated with gastroparesis.  She is complaining of pain in the chest and abdomen but similar to prior flares.  She is very tachycardic which has also been the case in the past.  Doubt PE. No peritoneal findings on exam.   12:14 AM  Patient re-evaluated. She reports feeling better. IVF bolus ongoing. Does not feel that she needs additional meds at this time. Will re-evaluate after IVF.   02:00 AM  Patient reevaluated.  She reports feeling much better.  She continues to have  some mild tachycardia but blood pressure is much improved.  Tachycardia is chronic for her.  She is tolerating liquids PO.  Advised that she continue mostly liquid diet for least the next 24 hours and then transition to bland solids as she is able.  She is requesting a refill of her erythromycin which she is on chronically and was sent to the pharmacy.  She has nausea medications at home.  Advise close PCP follow-up.  No significant electrolyte abnormality requiring repletion here. Stable for discharge.  ____________________________________________  FINAL CLINICAL IMPRESSION(S) / ED DIAGNOSES  Final diagnoses:  Gastroparesis     MEDICATIONS GIVEN DURING THIS VISIT:  Medications  ondansetron (ZOFRAN-ODT) disintegrating tablet 4 mg (4 mg Oral Given 09/07/20 1437)  HYDROmorphone (DILAUDID) injection 1 mg (1 mg Intravenous Given 09/07/20 2352)  pantoprazole (PROTONIX) injection 40 mg (40 mg Intravenous Given 09/07/20 2353)  metoCLOPramide (REGLAN) injection 10 mg (10 mg Intravenous Given 09/07/20 2354)  sodium chloride 0.9 % bolus 1,000 mL (0 mLs Intravenous Stopped 09/08/20 0030)  insulin aspart (novoLOG) injection 8 Units (8 Units Intravenous Given 09/08/20 0120)     Note:  This document was prepared using Dragon voice recognition software and may include unintentional dictation errors.  Alona Bene, MD, [facility] Emergency Medicine    Ariani Seier, Arlyss Repress, MD 09/08/20 [phone]

## 2020-09-08 ENCOUNTER — Emergency Department (HOSPITAL_COMMUNITY): Payer: 59

## 2020-09-08 ENCOUNTER — Inpatient Hospital Stay (HOSPITAL_COMMUNITY)
Admission: EM | Admit: 2020-09-08 | Discharge: 2020-09-11 | DRG: 074 | Disposition: A | Discharge status: Home / Self Care | Payer: 59 | Attending: Family Medicine | Admitting: Family Medicine

## 2020-09-08 ENCOUNTER — Encounter (HOSPITAL_COMMUNITY): Payer: Self-pay

## 2020-09-08 ENCOUNTER — Other Ambulatory Visit: Payer: Self-pay

## 2020-09-08 DIAGNOSIS — Z79899 Other long term (current) drug therapy: Secondary | ICD-10-CM

## 2020-09-08 DIAGNOSIS — Z89422 Acquired absence of other left toe(s): Secondary | ICD-10-CM

## 2020-09-08 DIAGNOSIS — D649 Anemia, unspecified: Secondary | ICD-10-CM | POA: Diagnosis present

## 2020-09-08 DIAGNOSIS — K219 Gastro-esophageal reflux disease without esophagitis: Secondary | ICD-10-CM | POA: Diagnosis present

## 2020-09-08 DIAGNOSIS — E1065 Type 1 diabetes mellitus with hyperglycemia: Secondary | ICD-10-CM | POA: Diagnosis present

## 2020-09-08 DIAGNOSIS — Z20822 Contact with and (suspected) exposure to covid-19: Secondary | ICD-10-CM | POA: Diagnosis present

## 2020-09-08 DIAGNOSIS — Z794 Long term (current) use of insulin: Secondary | ICD-10-CM

## 2020-09-08 DIAGNOSIS — E1021 Type 1 diabetes mellitus with diabetic nephropathy: Secondary | ICD-10-CM | POA: Diagnosis present

## 2020-09-08 DIAGNOSIS — E860 Dehydration: Secondary | ICD-10-CM | POA: Diagnosis present

## 2020-09-08 DIAGNOSIS — R000 Tachycardia, unspecified: Secondary | ICD-10-CM | POA: Diagnosis present

## 2020-09-08 DIAGNOSIS — Z8616 Personal history of COVID-19: Secondary | ICD-10-CM

## 2020-09-08 DIAGNOSIS — R739 Hyperglycemia, unspecified: Secondary | ICD-10-CM

## 2020-09-08 DIAGNOSIS — I152 Hypertension secondary to endocrine disorders: Secondary | ICD-10-CM | POA: Diagnosis present

## 2020-09-08 DIAGNOSIS — Z833 Family history of diabetes mellitus: Secondary | ICD-10-CM

## 2020-09-08 DIAGNOSIS — Z8041 Family history of malignant neoplasm of ovary: Secondary | ICD-10-CM

## 2020-09-08 DIAGNOSIS — K3184 Gastroparesis: Secondary | ICD-10-CM | POA: Diagnosis present

## 2020-09-08 DIAGNOSIS — Z801 Family history of malignant neoplasm of trachea, bronchus and lung: Secondary | ICD-10-CM

## 2020-09-08 DIAGNOSIS — Z800 Family history of malignant neoplasm of digestive organs: Secondary | ICD-10-CM

## 2020-09-08 DIAGNOSIS — Z803 Family history of malignant neoplasm of breast: Secondary | ICD-10-CM

## 2020-09-08 DIAGNOSIS — E1043 Type 1 diabetes mellitus with diabetic autonomic (poly)neuropathy: Principal | ICD-10-CM | POA: Diagnosis present

## 2020-09-08 DIAGNOSIS — I10 Essential (primary) hypertension: Secondary | ICD-10-CM | POA: Diagnosis present

## 2020-09-08 DIAGNOSIS — Z818 Family history of other mental and behavioral disorders: Secondary | ICD-10-CM

## 2020-09-08 LAB — COMPREHENSIVE METABOLIC PANEL
ALT: 27 U/L (ref 0–44)
AST: 22 U/L (ref 15–41)
Albumin: 4.1 g/dL (ref 3.5–5.0)
Alkaline Phosphatase: 71 U/L (ref 38–126)
Anion gap: 18 — ABNORMAL HIGH (ref 5–15)
BUN: 34 mg/dL — ABNORMAL HIGH (ref 6–20)
CO2: 22 mmol/L (ref 22–32)
Calcium: 10.1 mg/dL (ref 8.9–10.3)
Chloride: 95 mmol/L — ABNORMAL LOW (ref 98–111)
Creatinine, Ser: 1.28 mg/dL — ABNORMAL HIGH (ref 0.44–1.00)
GFR, Estimated: 58 mL/min — ABNORMAL LOW (ref >60)
Glucose, Bld: 378 mg/dL — ABNORMAL HIGH (ref 70–99)
Potassium: 3.8 mmol/L (ref 3.5–5.1)
Sodium: 135 mmol/L (ref 135–145)
Total Bilirubin: 1.7 mg/dL — ABNORMAL HIGH (ref 0.3–1.2)
Total Protein: 7.9 g/dL (ref 6.5–8.1)

## 2020-09-08 LAB — URINALYSIS, ROUTINE W REFLEX MICROSCOPIC
Bilirubin Urine: NEGATIVE
Glucose, UA: >=500 mg/dL — AB
Ketones, ur: 20 mg/dL — AB
Leukocytes,Ua: NEGATIVE
Nitrite: NEGATIVE
Protein, ur: >=300 mg/dL — AB
Specific Gravity, Urine: 1.021 (ref 1.005–1.030)
pH: 5 (ref 5.0–8.0)

## 2020-09-08 LAB — CBC WITH DIFFERENTIAL/PLATELET
Abs Immature Granulocytes: 0.03 10*3/uL (ref 0.00–0.07)
Basophils Absolute: 0 10*3/uL (ref 0.0–0.1)
Basophils Relative: 0 %
Eosinophils Absolute: 0 10*3/uL (ref 0.0–0.5)
Eosinophils Relative: 0 %
HCT: 35.4 % — ABNORMAL LOW (ref 36.0–46.0)
Hemoglobin: 11.9 g/dL — ABNORMAL LOW (ref 12.0–15.0)
Immature Granulocytes: 0 %
Lymphocytes Relative: 15 %
Lymphs Abs: 1.8 10*3/uL (ref 0.7–4.0)
MCH: 30 pg (ref 26.0–34.0)
MCHC: 33.6 g/dL (ref 30.0–36.0)
MCV: 89.2 fL (ref 80.0–100.0)
Monocytes Absolute: 0.6 10*3/uL (ref 0.1–1.0)
Monocytes Relative: 5 %
Neutro Abs: 9.6 10*3/uL — ABNORMAL HIGH (ref 1.7–7.7)
Neutrophils Relative %: 80 %
Platelets: 395 10*3/uL (ref 150–400)
RBC: 3.97 MIL/uL (ref 3.87–5.11)
RDW: 13.1 % (ref 11.5–15.5)
WBC: 12.1 10*3/uL — ABNORMAL HIGH (ref 4.0–10.5)
nRBC: 0 % (ref 0.0–0.2)

## 2020-09-08 LAB — CBG MONITORING, ED
Glucose-Capillary: 317 mg/dL — ABNORMAL HIGH (ref 70–99)
Glucose-Capillary: 372 mg/dL — ABNORMAL HIGH (ref 70–99)

## 2020-09-08 LAB — RESP PANEL BY RT-PCR (FLU A&B, COVID) ARPGX2
Influenza A by PCR: NEGATIVE
Influenza B by PCR: NEGATIVE
SARS Coronavirus 2 by RT PCR: NEGATIVE

## 2020-09-08 LAB — PREGNANCY, URINE: Preg Test, Ur: NEGATIVE

## 2020-09-08 LAB — LIPASE, BLOOD: Lipase: 25 U/L (ref 11–51)

## 2020-09-08 MED ORDER — HYDROMORPHONE HCL 1 MG/ML IJ SOLN
1.0000 mg | Freq: Once | INTRAMUSCULAR | Status: AC
  Administered 2020-09-08: 1 mg via INTRAVENOUS
  Filled 2020-09-08: qty 1

## 2020-09-08 MED ORDER — SODIUM CHLORIDE 0.9 % IV BOLUS
1000.0000 mL | Freq: Once | INTRAVENOUS | Status: AC
  Administered 2020-09-08: 1000 mL via INTRAVENOUS

## 2020-09-08 MED ORDER — FAMOTIDINE 20 MG PO TABS
20.0000 mg | ORAL_TABLET | Freq: Two times a day (BID) | ORAL | Status: DC
  Administered 2020-09-09 – 2020-09-11 (×6): 20 mg via ORAL
  Filled 2020-09-08 (×5): qty 1

## 2020-09-08 MED ORDER — ADULT MULTIVITAMIN W/MINERALS CH
1.0000 | ORAL_TABLET | Freq: Every day | ORAL | Status: DC
  Administered 2020-09-10 – 2020-09-11 (×2): 1 via ORAL
  Filled 2020-09-08 (×3): qty 1

## 2020-09-08 MED ORDER — ACETAMINOPHEN 650 MG RE SUPP: 650.0000 mg | Freq: Four times a day (QID) | RECTAL | Status: DC | PRN

## 2020-09-08 MED ORDER — DIPHENHYDRAMINE HCL 50 MG/ML IJ SOLN
12.5000 mg | Freq: Once | INTRAMUSCULAR | Status: AC
  Administered 2020-09-08: 12.5 mg via INTRAVENOUS
  Filled 2020-09-08: qty 1

## 2020-09-08 MED ORDER — ERYTHROMYCIN BASE 250 MG PO TABS: 250.0000 mg | ORAL_TABLET | Freq: Two times a day (BID) | ORAL | 0 refills | Status: AC

## 2020-09-08 MED ORDER — METOCLOPRAMIDE HCL 5 MG/ML IJ SOLN
10.0000 mg | Freq: Once | INTRAMUSCULAR | Status: AC
  Administered 2020-09-08: 10 mg via INTRAVENOUS
  Filled 2020-09-08: qty 2

## 2020-09-08 MED ORDER — PROMETHAZINE HCL 25 MG PO TABS
12.5000 mg | ORAL_TABLET | Freq: Four times a day (QID) | ORAL | Status: DC | PRN
  Administered 2020-09-09: 12.5 mg via ORAL
  Filled 2020-09-08: qty 1

## 2020-09-08 MED ORDER — ENOXAPARIN SODIUM 40 MG/0.4ML IJ SOSY: 40.0000 mg | PREFILLED_SYRINGE | INTRAMUSCULAR | Status: DC

## 2020-09-08 MED ORDER — ACETAMINOPHEN 325 MG PO TABS
650.0000 mg | ORAL_TABLET | Freq: Four times a day (QID) | ORAL | Status: DC | PRN
  Administered 2020-09-09 – 2020-09-11 (×2): 650 mg via ORAL
  Filled 2020-09-08 (×3): qty 2

## 2020-09-08 MED ORDER — INSULIN ASPART 100 UNIT/ML IV SOLN
8.0000 [IU] | Freq: Once | INTRAVENOUS | Status: AC
  Administered 2020-09-08: 8 [IU] via INTRAVENOUS
  Filled 2020-09-08: qty 0.08

## 2020-09-08 MED ORDER — INSULIN ASPART 100 UNIT/ML IJ SOLN
0.0000 [IU] | INTRAMUSCULAR | Status: DC
  Administered 2020-09-09: 2 [IU] via SUBCUTANEOUS
  Administered 2020-09-09: 1 [IU] via SUBCUTANEOUS
  Administered 2020-09-09 (×2): 2 [IU] via SUBCUTANEOUS
  Administered 2020-09-09: 1 [IU] via SUBCUTANEOUS
  Administered 2020-09-09: 2 [IU] via SUBCUTANEOUS
  Administered 2020-09-10 (×3): 1 [IU] via SUBCUTANEOUS
  Administered 2020-09-10: 2 [IU] via SUBCUTANEOUS
  Administered 2020-09-11: 1 [IU] via SUBCUTANEOUS
  Filled 2020-09-08: qty 0.09

## 2020-09-08 MED ORDER — HYDROMORPHONE HCL 1 MG/ML IJ SOLN
1.0000 mg | Freq: Once | INTRAMUSCULAR | Status: AC
  Administered 2020-09-09: 1 mg via INTRAVENOUS
  Filled 2020-09-08: qty 1

## 2020-09-08 MED ORDER — LEVALBUTEROL TARTRATE 45 MCG/ACT IN AERO: 2.0000 | INHALATION_SPRAY | Freq: Four times a day (QID) | RESPIRATORY_TRACT | Status: DC | PRN

## 2020-09-08 MED ORDER — METOCLOPRAMIDE HCL 5 MG/ML IJ SOLN
10.0000 mg | Freq: Three times a day (TID) | INTRAMUSCULAR | Status: DC
  Administered 2020-09-09 – 2020-09-11 (×8): 10 mg via INTRAVENOUS
  Filled 2020-09-08 (×8): qty 2

## 2020-09-08 MED ORDER — SODIUM CHLORIDE 0.9 % IV SOLN
250.0000 mg | Freq: Four times a day (QID) | INTRAVENOUS | Status: DC
  Administered 2020-09-09 – 2020-09-11 (×11): 250 mg via INTRAVENOUS
  Filled 2020-09-08 (×17): qty 5

## 2020-09-08 MED ORDER — ONDANSETRON HCL 4 MG PO TABS: 4.0000 mg | ORAL_TABLET | Freq: Four times a day (QID) | ORAL | Status: DC | PRN

## 2020-09-08 MED ORDER — SODIUM CHLORIDE 0.9 % IV SOLN
1000.0000 mL | INTRAVENOUS | Status: DC
  Administered 2020-09-08 – 2020-09-10 (×5): 1000 mL via INTRAVENOUS

## 2020-09-08 MED ORDER — LISINOPRIL 10 MG PO TABS: 10.0000 mg | ORAL_TABLET | Freq: Every day | ORAL | Status: DC

## 2020-09-08 MED ORDER — INSULIN ASPART 100 UNIT/ML IJ SOLN
8.0000 [IU] | Freq: Once | INTRAMUSCULAR | Status: AC
  Administered 2020-09-08: 8 [IU] via SUBCUTANEOUS
  Filled 2020-09-08: qty 0.08

## 2020-09-08 MED ORDER — INSULIN GLARGINE 100 UNIT/ML ~~LOC~~ SOLN
18.0000 [IU] | Freq: Every day | SUBCUTANEOUS | Status: DC
  Administered 2020-09-09 – 2020-09-11 (×3): 18 [IU] via SUBCUTANEOUS
  Filled 2020-09-08 (×3): qty 0.18

## 2020-09-08 MED ORDER — PANTOPRAZOLE SODIUM 40 MG IV SOLR
40.0000 mg | Freq: Once | INTRAVENOUS | Status: AC
  Administered 2020-09-08: 40 mg via INTRAVENOUS
  Filled 2020-09-08: qty 40

## 2020-09-08 MED ORDER — ONDANSETRON HCL 4 MG/2ML IJ SOLN
4.0000 mg | Freq: Four times a day (QID) | INTRAMUSCULAR | Status: DC | PRN
  Administered 2020-09-10: 4 mg via INTRAVENOUS
  Filled 2020-09-08 (×2): qty 2

## 2020-09-08 MED ORDER — SODIUM CHLORIDE 0.9 % IV BOLUS (SEPSIS)
1000.0000 mL | Freq: Once | INTRAVENOUS | Status: AC
  Administered 2020-09-08: 1000 mL via INTRAVENOUS

## 2020-09-08 MED ORDER — ATENOLOL 25 MG PO TABS
25.0000 mg | ORAL_TABLET | Freq: Two times a day (BID) | ORAL | Status: DC
  Filled 2020-09-08: qty 1

## 2020-09-08 NOTE — Discharge Instructions (Addendum)
You have been seen in the Emergency Department (ED) today for nausea and vomiting.   Follow up with your doctor as soon as possible regarding today's emergent visit and your symptoms of nausea.   Return to the ED if you develop abdominal, bloody vomiting, bloody diarrhea, if you are unable to tolerate fluids due to vomiting, or if you develop other symptoms that concern you.

## 2020-09-08 NOTE — ED Notes (Signed)
Pt placed pon 2l 02 due to sats decreasing after dilaudid administration

## 2020-09-08 NOTE — ED Provider Notes (Signed)
[facility] DEPT Provider Note   CSN: [id number] Arrival date & time: 09/08/20  1259     History Chief Complaint  Patient presents with   Hyperglycemia    Kelli Hudson is a 30 y.o. female.   Hyperglycemia  Patient has a history of diabetes and diabetic gastroparesis with frequent visits to the hospital as a result of her symptoms.  Patient's been to the ED 9 times in the last 6 months unfortunately.  Patient states she is having a recurrent episode of her gastroparesis.  Patient was evaluated in the ED yesterday for the same symptoms.  She feels like she went home too soon.  Patient started having nausea and vomiting and abdominal pain yesterday.  The pain is diffuse throughout her abdomen.  She has vomited multiple times and is requesting medications for pain.  She denies any fevers or chills.  No difficulty breathing.  No dysuria .  patient states she has been taking her medications  Past Medical History:  Diagnosis Date   Acute H. pylori gastric ulcer    Diabetes mellitus ([place])    Diabetic gastroparesis ([place])    Gastroparesis    GERD (gastroesophageal reflux disease)    Hypertension    Hyperthyroidism     Patient Active Problem List   Diagnosis Date Noted   Diabetic gastroparesis associated with type 1 diabetes mellitus ([place]) 08/12/2020   AKI (acute kidney injury) ([place]) 07/25/2020   Abdominal pain 07/23/2020   GERD (gastroesophageal reflux disease) 07/23/2020   Diabetic nephropathy ([place]) 06/27/2020   Sepsis ([place]) 05/27/2020   Borderline hyperlipidemia 05/23/2020   Gastroparesis due to DM ([place]) 05/11/2020   Uncontrolled type 2 diabetes mellitus with gastroparesis ([place]) 05/09/2020   Tachycardia 05/09/2020   Normocytic anemia 04/20/2020   Hyperosmolar hyperglycemic state (HHS) ([place]) 02/24/2020   Intractable nausea and vomiting    Intractable abdominal pain 01/24/2020   H. pylori infection 10/07/2019   Depression with anxiety  07/05/2019   Diabetic foot ulcer ([place]) 02/21/2018   Diabetic polyneuropathy associated with type 2 diabetes mellitus ([place]) 09/24/2017   Pain in right foot 09/24/2017   Gastroparesis    Leukocytosis    Increased frequency of urination 06/14/2011   UTI (urinary tract infection) 06/14/2011   Goiter 10/05/2009   Uncontrolled diabetes mellitus ([place]) 10/05/2009   Essential hypertension 10/05/2009    Past Surgical History:  Procedure Laterality Date   AMPUTATION TOE Left 03/10/2018   Procedure: AMPUTATION FIFTH TOE;  Surgeon: Trula Slade, DPM;  Location: [facility];  Service: Podiatry;  Laterality: Left;   BIOPSY  01/28/2020   Procedure: BIOPSY;  Surgeon: Otis Brace, MD;  Location: [facility];  Service: Gastroenterology;;   ESOPHAGOGASTRODUODENOSCOPY N/A 01/28/2020   Procedure: ESOPHAGOGASTRODUODENOSCOPY (EGD);  Surgeon: Otis Brace, MD;  Location: [facility];  Service: Gastroenterology;  Laterality: N/A;   ESOPHAGOGASTRODUODENOSCOPY (EGD) WITH PROPOFOL Left 09/08/2015   Procedure: ESOPHAGOGASTRODUODENOSCOPY (EGD) WITH PROPOFOL;  Surgeon: Arta Silence, MD;  Location: [facility];  Service: Endoscopy;  Laterality: Left;   WISDOM TOOTH EXTRACTION       OB History     Gravida  0   Para  0   Term  0   Preterm  0   AB  0   Living  0      SAB  0   IAB  0   Ectopic  0   Multiple  0   Live Births  0  Family History  Problem Relation Age of Onset   Lung cancer Mother    Diabetes Mother    Bipolar disorder Father    Liver cancer Maternal Grandfather    Breast cancer Other        maternal great aunt   Heart disease Other    Ovarian cancer Other        maternal great aunt   Pancreatic cancer Maternal Uncle    Prostate cancer Maternal Uncle    Diabetes Maternal Aunt        x 2   Kidney disease Other        maternal great aunt    Social History   Tobacco Use   Smoking status: Never   Smokeless tobacco: Never   Vaping Use   Vaping Use: Never used  Substance Use Topics   Alcohol use: Yes    Alcohol/week: 1.0 standard drink    Types: 1 Shots of liquor per week    Comment: occasional    Drug use: No    Home Medications Prior to Admission medications   Medication Sig Start Date End Date Taking? Authorizing Provider  atenolol (TENORMIN) 25 MG tablet Take 1 tablet (25 mg total) by mouth 2 (two) times daily. 01/30/20   Mariel Aloe, MD  Blood Glucose Monitoring Suppl (TRUE METRIX METER) w/Device KIT 1 Device by Does not apply route 4 (four) times daily. 07/05/19   Libby Maw, MD  erythromycin (E-MYCIN) 250 MG tablet Take 1 tablet (250 mg total) by mouth 2 (two) times daily. 09/08/20 10/08/20  LongWonda Olds, MD  glucose blood (TRUE METRIX BLOOD GLUCOSE TEST) test strip Use as instructed 09/03/20   [name], MD  hyoscyamine (LEVBID) 0.375 MG 12 hr tablet Take 1 tablet (0.375 mg total) by mouth every 12 (twelve) hours as needed for cramping (abdominal pain). 05/14/20   Raiford Noble Latif, DO  insulin glargine (LANTUS) 100 UNIT/ML injection Inject 0.18 mLs (18 Units total) into the skin daily. 08/15/20   Aline August, MD  Insulin Pen Needle 31G X 5 MM MISC 1 Device by Does not apply route QID. For use with insulin pens 07/05/19   Libby Maw, MD  Insulin Pen Needle 31G X 5 MM MISC Lantus 18 units daily 08/15/20   Aline August, MD  insulin regular (NOVOLIN R) 100 units/mL injection Inject 0-0.15 mLs (0-15 Units total) into the skin 3 (three) times daily before meals. CBG 70 - 120: 0 units CBG 121 - 150: 2 units CBG 151 - 200: 3 units CBG 201 - 250: 5 units CBG 251 - 300: 8 units CBG 301 - 350: 11 units CBG 351 - 400: 15 units 05/14/20   Sheikh, Omair Latif, DO  levalbuterol [facility] HFA) 45 MCG/ACT inhaler Inhale 2 puffs into the lungs every 6 (six) hours as needed for wheezing or shortness of breath. 05/28/20   Little Ishikawa, MD  lisinopril (ZESTRIL) 10 MG tablet Take  1 tablet (10 mg total) by mouth daily. 06/10/20   Cherene Altes, MD  metoCLOPramide (REGLAN) 10 MG tablet Take 1 tablet (10 mg total) by mouth 3 (three) times daily before meals. 08/15/20   Aline August, MD  Multiple Vitamin (MULTIVITAMIN WITH MINERALS) TABS tablet Take 1 tablet by mouth daily.    [provider]  ondansetron (ZOFRAN ODT) 4 MG disintegrating tablet Take 1 tablet (4 mg total) by mouth every 6 (six) hours as needed for nausea or vomiting. 04/20/20  Esterwood, Amy S, PA-C  pantoprazole (PROTONIX) 40 MG tablet Take 1 tablet (40 mg total) by mouth daily. 07/24/20   Dwyane Dee, MD  promethazine (PHENERGAN) 12.5 MG tablet Take 1 tablet (12.5 mg total) by mouth every 6 (six) hours as needed for nausea or vomiting. 05/14/20   Raiford Noble DeLisle, DO  TRUEPLUS LANCETS 28G MISC assist with checking blood sugar TID and qhs 09/10/15   Zettie Pho S, PA-C  insulin aspart (NOVOLOG) 100 UNIT/ML FlexPen Inject 5 Units into the skin 3 (three) times daily with meals. 02/23/18 07/05/19  Donne Hazel, MD    Allergies    Patient has no known allergies.  Review of Systems   Review of Systems  All other systems reviewed and are negative.  Physical Exam Updated Vital Signs BP (!) 136/115   Pulse (!) 115   Temp 98.8 F (37.1 C) (Oral)   Resp 20   SpO2 100%   Physical Exam Vitals and nursing note reviewed.  Constitutional:      Appearance: She is well-developed. She is ill-appearing.  HENT:     Head: Normocephalic and atraumatic.     Right Ear: External ear normal.     Left Ear: External ear normal.  Eyes:     General: No scleral icterus.       Right eye: No discharge.        Left eye: No discharge.     Conjunctiva/sclera: Conjunctivae normal.  Neck:     Trachea: No tracheal deviation.  Cardiovascular:     Rate and Rhythm: Regular rhythm. Tachycardia present.  Pulmonary:     Effort: Pulmonary effort is normal. No respiratory distress.     Breath sounds: Normal breath  sounds. No stridor. No wheezing or rales.  Abdominal:     General: Bowel sounds are normal. There is no distension.     Palpations: Abdomen is soft.     Tenderness: There is generalized abdominal tenderness. There is no guarding or rebound.  Musculoskeletal:        General: No tenderness or deformity.     Cervical back: Neck supple.  Skin:    General: Skin is warm and dry.     Findings: No rash.  Neurological:     General: No focal deficit present.     Mental Status: She is alert.     Cranial Nerves: No cranial nerve deficit (no facial droop, extraocular movements intact, no slurred speech).     Sensory: No sensory deficit.     Motor: No abnormal muscle tone or seizure activity.     Coordination: Coordination normal.  Psychiatric:        Mood and Affect: Mood normal.    ED Results / Procedures / Treatments   Labs (all labs ordered are listed, but only abnormal results are displayed) Labs Reviewed  COMPREHENSIVE METABOLIC PANEL - Abnormal; Notable for the following components:      Result Value   Chloride 95 (*)    Glucose, Bld 378 (*)    BUN 34 (*)    Creatinine, Ser 1.28 (*)    Total Bilirubin 1.7 (*)    GFR, Estimated 58 (*)    Anion gap 18 (*)    All other components within normal limits  CBC WITH DIFFERENTIAL/PLATELET - Abnormal; Notable for the following components:   WBC 12.1 (*)    Hemoglobin 11.9 (*)    HCT 35.4 (*)    Neutro Abs 9.6 (*)    All other  components within normal limits  CBG MONITORING, ED - Abnormal; Notable for the following components:   Glucose-Capillary 372 (*)    All other components within normal limits  LIPASE, BLOOD  URINALYSIS, ROUTINE W REFLEX MICROSCOPIC  PREGNANCY, URINE    EKG EKG Interpretation  Date/Time:  Saturday September 08 2020 18:55:36 EDT Ventricular Rate:  144 PR Interval:  152 QRS Duration: 76 QT Interval:  277 QTC Calculation: 429 R Axis:   67 Text Interpretation: Sinus tachycardia Consider right atrial enlargement  Borderline T abnormalities, inferior leads No significant change since last tracing Confirmed by Dorie Rank [phone]) on 09/08/2020 6:57:48 PM  Radiology DG Abdomen Acute W/Chest  Result Date: 09/08/2020 CLINICAL DATA:  Vomiting EXAM: DG ABDOMEN ACUTE WITH 1 VIEW CHEST COMPARISON:  None. FINDINGS: There is no evidence of dilated bowel loops or free intraperitoneal air. No radiopaque calculi or other significant radiographic abnormality is seen. Heart size and mediastinal contours are within normal limits. Both lungs are clear. IMPRESSION: Negative abdominal radiographs.  No acute cardiopulmonary disease. Electronically Signed   By: Ulyses Jarred M.D.   On: 09/08/2020 21:20    Procedures Procedures   Medications Ordered in ED Medications  sodium chloride 0.9 % bolus 1,000 mL (0 mLs Intravenous Stopped 09/08/20 2001)    Followed by  0.9 %  sodium chloride infusion (0 mLs Intravenous Stopped 09/08/20 2214)  sodium chloride 0.9 % bolus 1,000 mL (has no administration in time range)  HYDROmorphone (DILAUDID) injection 1 mg (has no administration in time range)  metoCLOPramide (REGLAN) injection 10 mg (10 mg Intravenous Given 09/08/20 1846)  diphenhydrAMINE (BENADRYL) injection 12.5 mg (12.5 mg Intravenous Given 09/08/20 1846)  pantoprazole (PROTONIX) injection 40 mg (40 mg Intravenous Given 09/08/20 1847)  insulin aspart (novoLOG) injection 8 Units (8 Units Subcutaneous Given 09/08/20 1915)  HYDROmorphone (DILAUDID) injection 1 mg (1 mg Intravenous Given 09/08/20 1915)    ED Course  I have reviewed the triage vital signs and the nursing notes.  Pertinent labs & imaging results that were available during my care of the patient were reviewed by me and considered in my medical decision making (see chart for details).  Clinical Course as of 09/08/20 2256  Sat Sep 08, 2020  [place] reviewed.  Leukocytosis noted [JK]  1832 Hemoglobin stable compared to previous values.  Laboratory test show  hyperglycemia without signs of acidosis [JK]  1838 Patient had a negative pregnancy test yesterday [JK]  2055 Tachycardia has decreased somewhat.   [JK]  2120 Patient states she is feeling better.  Would like to try to drink some fluids [JK]    Clinical Course User Index [JK] Dorie Rank, MD   MDM Rules/Calculators/A&P                           Patient presented to the ED for evaluation of recurrent abdominal pain vomiting.  Recently seen and attempted outpatient management but symptoms returned.  Patient does have history of recurrent gastroparesis.  Patient was treated with IV fluids and antiemetics.  She has had some improvement but she still is having persistent symptoms.  Able to tolerate some liquids but not able to eat anything yet.  Patient's labs do show hyperglycemia and evidence of dehydration without signs of acidosis.  No DKA.  Chest x-ray without signs of obstruction..  With her persistent sx will consult with medical service for admission.   Final Clinical Impression(s) / ED Diagnoses Final diagnoses:  Gastroparesis  Tachycardia  Hyperglycemia     Dorie Rank, MD 09/08/20 2259

## 2020-09-08 NOTE — ED Provider Notes (Signed)
Emergency Medicine Provider Triage Evaluation Note  Zyniah Ferraiolo , a 30 y.o. female  was evaluated in triage.  Pt complains of N/V x 3 hours. TNTC episodes of emesis. Associated abdominal pain which is generalized. Per EMS CBG in the 500s. Patient last took insulin last night.   Review of Systems  Positive: N/V, abdominal pain Negative: Diarrhea, dysuria.   Physical Exam  BP 138/86 (BP Location: Left Arm)   Pulse (!) 123   Temp 98.8 F (37.1 C) (Oral)   Resp 18   SpO2 99%  Gen:   Awake Resp:  Normal effort  MSK:   Moves extremities without difficulty  Other:  Mild generalized abdominal tenderness   Medical Decision Making  Medically screening exam initiated at 1:06 PM.  Appropriate orders placed.  Mabrey Howland was informed that the remainder of the evaluation will be completed by another provider, this initial triage assessment does not replace that evaluation, and the importance of remaining in the ED until their evaluation is complete.  N/V   Desmond Lope 09/08/20 1308    Wynetta Fines, MD 09/08/20 [phone]

## 2020-09-08 NOTE — ED Triage Notes (Signed)
Pt arrived via EMS from home, abd pain, vomiting x3 hrs, high CBG has not taken any insulin.

## 2020-09-08 NOTE — ED Notes (Signed)
Pt's BS 317.

## 2020-09-09 DIAGNOSIS — E860 Dehydration: Secondary | ICD-10-CM | POA: Diagnosis present

## 2020-09-09 DIAGNOSIS — Z818 Family history of other mental and behavioral disorders: Secondary | ICD-10-CM | POA: Diagnosis not present

## 2020-09-09 DIAGNOSIS — Z8616 Personal history of COVID-19: Secondary | ICD-10-CM | POA: Diagnosis not present

## 2020-09-09 DIAGNOSIS — R739 Hyperglycemia, unspecified: Secondary | ICD-10-CM

## 2020-09-09 DIAGNOSIS — I10 Essential (primary) hypertension: Secondary | ICD-10-CM

## 2020-09-09 DIAGNOSIS — R000 Tachycardia, unspecified: Secondary | ICD-10-CM | POA: Diagnosis present

## 2020-09-09 DIAGNOSIS — Z794 Long term (current) use of insulin: Secondary | ICD-10-CM | POA: Diagnosis not present

## 2020-09-09 DIAGNOSIS — Z803 Family history of malignant neoplasm of breast: Secondary | ICD-10-CM | POA: Diagnosis not present

## 2020-09-09 DIAGNOSIS — Z8041 Family history of malignant neoplasm of ovary: Secondary | ICD-10-CM | POA: Diagnosis not present

## 2020-09-09 DIAGNOSIS — E1043 Type 1 diabetes mellitus with diabetic autonomic (poly)neuropathy: Principal | ICD-10-CM

## 2020-09-09 DIAGNOSIS — K219 Gastro-esophageal reflux disease without esophagitis: Secondary | ICD-10-CM | POA: Diagnosis present

## 2020-09-09 DIAGNOSIS — E1065 Type 1 diabetes mellitus with hyperglycemia: Secondary | ICD-10-CM | POA: Diagnosis present

## 2020-09-09 DIAGNOSIS — Z801 Family history of malignant neoplasm of trachea, bronchus and lung: Secondary | ICD-10-CM | POA: Diagnosis not present

## 2020-09-09 DIAGNOSIS — K3184 Gastroparesis: Secondary | ICD-10-CM | POA: Diagnosis present

## 2020-09-09 DIAGNOSIS — Z800 Family history of malignant neoplasm of digestive organs: Secondary | ICD-10-CM | POA: Diagnosis not present

## 2020-09-09 DIAGNOSIS — Z79899 Other long term (current) drug therapy: Secondary | ICD-10-CM | POA: Diagnosis not present

## 2020-09-09 DIAGNOSIS — E1021 Type 1 diabetes mellitus with diabetic nephropathy: Secondary | ICD-10-CM | POA: Diagnosis present

## 2020-09-09 DIAGNOSIS — Z89422 Acquired absence of other left toe(s): Secondary | ICD-10-CM | POA: Diagnosis not present

## 2020-09-09 DIAGNOSIS — Z20822 Contact with and (suspected) exposure to covid-19: Secondary | ICD-10-CM | POA: Diagnosis present

## 2020-09-09 DIAGNOSIS — Z833 Family history of diabetes mellitus: Secondary | ICD-10-CM | POA: Diagnosis not present

## 2020-09-09 DIAGNOSIS — D649 Anemia, unspecified: Secondary | ICD-10-CM | POA: Diagnosis present

## 2020-09-09 LAB — TYPE AND SCREEN
ABO/RH(D): O POS
Antibody Screen: NEGATIVE

## 2020-09-09 LAB — IRON AND TIBC
Iron: 96 ug/dL (ref 28–170)
Saturation Ratios: 19 % (ref 10.4–31.8)
TIBC: 493 ug/dL — ABNORMAL HIGH (ref 250–450)
UIBC: 397 ug/dL

## 2020-09-09 LAB — GLUCOSE, CAPILLARY: Glucose-Capillary: 124 mg/dL — ABNORMAL HIGH (ref 70–99)

## 2020-09-09 LAB — SARS CORONAVIRUS 2 (TAT 6-24 HRS): SARS Coronavirus 2: NEGATIVE

## 2020-09-09 LAB — CBC WITH DIFFERENTIAL/PLATELET
Abs Immature Granulocytes: 0.05 10*3/uL (ref 0.00–0.07)
Basophils Absolute: 0 10*3/uL (ref 0.0–0.1)
Basophils Relative: 0 %
Eosinophils Absolute: 0 10*3/uL (ref 0.0–0.5)
Eosinophils Relative: 0 %
HCT: 34.8 % — ABNORMAL LOW (ref 36.0–46.0)
Hemoglobin: 11.7 g/dL — ABNORMAL LOW (ref 12.0–15.0)
Immature Granulocytes: 0 %
Lymphocytes Relative: 11 %
Lymphs Abs: 1.3 10*3/uL (ref 0.7–4.0)
MCH: 29.8 pg (ref 26.0–34.0)
MCHC: 33.6 g/dL (ref 30.0–36.0)
MCV: 88.5 fL (ref 80.0–100.0)
Monocytes Absolute: 0.3 10*3/uL (ref 0.1–1.0)
Monocytes Relative: 3 %
Neutro Abs: 9.5 10*3/uL — ABNORMAL HIGH (ref 1.7–7.7)
Neutrophils Relative %: 86 %
Platelets: 375 10*3/uL (ref 150–400)
RBC: 3.93 MIL/uL (ref 3.87–5.11)
RDW: 12.7 % (ref 11.5–15.5)
WBC: 11.2 10*3/uL — ABNORMAL HIGH (ref 4.0–10.5)
nRBC: 0 % (ref 0.0–0.2)

## 2020-09-09 LAB — CBC
HCT: 27 % — ABNORMAL LOW (ref 36.0–46.0)
Hemoglobin: 9.1 g/dL — ABNORMAL LOW (ref 12.0–15.0)
MCH: 30.1 pg (ref 26.0–34.0)
MCHC: 33.7 g/dL (ref 30.0–36.0)
MCV: 89.4 fL (ref 80.0–100.0)
Platelets: 325 10*3/uL (ref 150–400)
RBC: 3.02 MIL/uL — ABNORMAL LOW (ref 3.87–5.11)
RDW: 13.2 % (ref 11.5–15.5)
WBC: 13.1 10*3/uL — ABNORMAL HIGH (ref 4.0–10.5)
nRBC: 0 % (ref 0.0–0.2)

## 2020-09-09 LAB — BASIC METABOLIC PANEL
Anion gap: 13 (ref 5–15)
BUN: 32 mg/dL — ABNORMAL HIGH (ref 6–20)
CO2: 23 mmol/L (ref 22–32)
Calcium: 8.9 mg/dL (ref 8.9–10.3)
Chloride: 103 mmol/L (ref 98–111)
Creatinine, Ser: 0.96 mg/dL (ref 0.44–1.00)
GFR, Estimated: >60 mL/min (ref >60)
Glucose, Bld: 160 mg/dL — ABNORMAL HIGH (ref 70–99)
Potassium: 3.9 mmol/L (ref 3.5–5.1)
Sodium: 139 mmol/L (ref 135–145)

## 2020-09-09 LAB — CBG MONITORING, ED
Glucose-Capillary: 141 mg/dL — ABNORMAL HIGH (ref 70–99)
Glucose-Capillary: 149 mg/dL — ABNORMAL HIGH (ref 70–99)
Glucose-Capillary: 152 mg/dL — ABNORMAL HIGH (ref 70–99)
Glucose-Capillary: 183 mg/dL — ABNORMAL HIGH (ref 70–99)
Glucose-Capillary: 183 mg/dL — ABNORMAL HIGH (ref 70–99)
Glucose-Capillary: 192 mg/dL — ABNORMAL HIGH (ref 70–99)

## 2020-09-09 LAB — HEMOGLOBIN AND HEMATOCRIT, BLOOD
HCT: 28.7 % — ABNORMAL LOW (ref 36.0–46.0)
HCT: 30.9 % — ABNORMAL LOW (ref 36.0–46.0)
HCT: 32.5 % — ABNORMAL LOW (ref 36.0–46.0)
Hemoglobin: 9.3 g/dL — ABNORMAL LOW (ref 12.0–15.0)
Hemoglobin: 10.3 g/dL — ABNORMAL LOW (ref 12.0–15.0)
Hemoglobin: 10.9 g/dL — ABNORMAL LOW (ref 12.0–15.0)

## 2020-09-09 LAB — FERRITIN: Ferritin: 34 ng/mL (ref 11–307)

## 2020-09-09 MED ORDER — HYDROMORPHONE HCL 1 MG/ML IJ SOLN
1.0000 mg | INTRAMUSCULAR | Status: AC | PRN
  Administered 2020-09-09: 1 mg via INTRAVENOUS
  Filled 2020-09-09: qty 1

## 2020-09-09 MED ORDER — LABETALOL HCL 5 MG/ML IV SOLN
10.0000 mg | INTRAVENOUS | Status: DC | PRN
  Administered 2020-09-09: 10 mg via INTRAVENOUS
  Filled 2020-09-09: qty 4

## 2020-09-09 MED ORDER — KETOROLAC TROMETHAMINE 15 MG/ML IJ SOLN
15.0000 mg | Freq: Four times a day (QID) | INTRAMUSCULAR | Status: DC | PRN
  Filled 2020-09-09: qty 1

## 2020-09-09 MED ORDER — HYDROMORPHONE HCL 1 MG/ML IJ SOLN
0.5000 mg | INTRAMUSCULAR | Status: DC | PRN
  Administered 2020-09-09 (×2): 0.5 mg via INTRAVENOUS
  Filled 2020-09-09 (×2): qty 1

## 2020-09-09 MED ORDER — ALBUTEROL SULFATE (2.5 MG/3ML) 0.083% IN NEBU: 2.5000 mg | INHALATION_SOLUTION | Freq: Four times a day (QID) | RESPIRATORY_TRACT | Status: DC | PRN

## 2020-09-09 MED ORDER — SODIUM CHLORIDE 0.9 % IV SOLN
12.5000 mg | Freq: Four times a day (QID) | INTRAVENOUS | Status: DC | PRN
  Administered 2020-09-09 – 2020-09-10 (×2): 12.5 mg via INTRAVENOUS
  Filled 2020-09-09 (×2): qty 12.5
  Filled 2020-09-09: qty 0.5

## 2020-09-09 MED ORDER — PANTOPRAZOLE SODIUM 40 MG IV SOLR
40.0000 mg | Freq: Two times a day (BID) | INTRAVENOUS | Status: DC
  Administered 2020-09-09 – 2020-09-11 (×5): 40 mg via INTRAVENOUS
  Filled 2020-09-09 (×6): qty 40

## 2020-09-09 NOTE — ED Notes (Signed)
Pt. CBG 192, RN, Reuel Boom made aware.

## 2020-09-09 NOTE — ED Notes (Signed)
Pt. CBG 183, RN, Vincenza Hews made aware.

## 2020-09-09 NOTE — ED Notes (Signed)
Day shift tech mistakenly validated RR of 117.

## 2020-09-09 NOTE — H&P (Signed)
History and Physical    Kelli Hudson VPX:[id number] DOB: [birth year] DOA: 09/08/2020  PCP: [name], MD  Patient coming from: Home  I have personally briefly reviewed patient's old medical records in [place]  Chief Complaint: Gastroparesis  HPI: Kelli Hudson is a 30 y.o. female with medical history significant of DM1, HTN, diabetic gastroparesis with frequent flare ups.  Pt in to ED with recurrent episode of gastroparesis, seen in ED yesterday for same.  Pt taking Reglan 51m TID at home.  Recently started on erythromycin just yesterday (only 1 dose of this so far).  Pt with N/V and abd pain, onset yesterday, persistent.  Pain diffuse.  Vomit is brown, no frank blood in vomit she doesn't think.  No melena nor hematochezia.  No fevers, chills, SOB, dysuria.  Denies active ABD pain, nausea at the time of my evaluation.   ED Course: Pt improved after meds in ED but EDP wants admit.  Still tachycardic to 110s, BP running around 1462systolic.  2L IVF bolus.   Review of Systems: As per HPI, otherwise all review of systems negative.  Past Medical History:  Diagnosis Date   Acute H. pylori gastric ulcer    Diabetes mellitus ([facility]    Diabetic gastroparesis ([place]    Gastroparesis    GERD (gastroesophageal reflux disease)    Hypertension    Hyperthyroidism     Past Surgical History:  Procedure Laterality Date   AMPUTATION TOE Left 03/10/2018   Procedure: AMPUTATION FIFTH TOE;  Surgeon: WTrula Slade DPM;  [facility]  Service: Podiatry;  Laterality: Left;   BIOPSY  01/28/2020   Procedure: BIOPSY;  Surgeon: BOtis Brace MD;  [facility];  Service: Gastroenterology;;   ESOPHAGOGASTRODUODENOSCOPY N/A 01/28/2020   Procedure: ESOPHAGOGASTRODUODENOSCOPY (EGD);  Surgeon: BOtis Brace MD;  [facility];  Service: Gastroenterology;  Laterality: N/A;   ESOPHAGOGASTRODUODENOSCOPY (EGD) WITH  PROPOFOL Left 09/08/2015   Procedure: ESOPHAGOGASTRODUODENOSCOPY (EGD) WITH PROPOFOL;  Surgeon: WArta Silence MD;  [facility];  Service: Endoscopy;  Laterality: Left;   WISDOM TOOTH EXTRACTION       reports that she has never smoked. She has never used smokeless tobacco. She reports current alcohol use of about 1.0 standard drink of alcohol per week. She reports that she does not use drugs.  No Known Allergies  Family History  Problem Relation Age of Onset   Lung cancer Mother    Diabetes Mother    Bipolar disorder Father    Liver cancer Maternal Grandfather    Breast cancer Other        maternal great aunt   Heart disease Other    Ovarian cancer Other        maternal great aunt   Pancreatic cancer Maternal Uncle    Prostate cancer Maternal Uncle    Diabetes Maternal Aunt        x 2   Kidney disease Other        maternal great aunt     Prior to Admission medications   Medication Sig Start Date End Date Taking? Authorizing Provider  atenolol (TENORMIN) 25 MG tablet Take 1 tablet (25 mg total) by mouth 2 (two) times daily. 01/30/20  Yes NMariel Aloe MD  Blood Glucose Monitoring Suppl (TRUE METRIX METER) w/Device KIT 1 Device by Does not apply route 4 (four) times daily. 07/05/19  Yes KLibby Maw MD  erythromycin (E-MYCIN) 250 MG tablet Take 1 tablet (250 mg total)  by mouth 2 (two) times daily. 09/08/20 10/08/20 Yes Long, Wonda Olds, MD  famotidine (PEPCID) 20 MG tablet Take 20 mg by mouth 2 (two) times daily. 09/06/20  Yes [provider]  glucose blood (TRUE METRIX BLOOD GLUCOSE TEST) test strip Use as instructed 09/03/20  Yes Rudd, Lillette Boxer, MD  hyoscyamine (LEVBID) 0.375 MG 12 hr tablet Take 1 tablet (0.375 mg total) by mouth every 12 (twelve) hours as needed for cramping (abdominal pain). 05/14/20  Yes Sheikh, Omair Latif, DO  insulin glargine (LANTUS) 100 UNIT/ML injection Inject 0.18 mLs (18 Units total) into the skin daily. 08/15/20  Yes Aline August, MD  Insulin Pen Needle 31G X 5 MM MISC 1 Device by Does not apply route QID. For use with insulin pens 07/05/19  Yes Libby Maw, MD  Insulin Pen Needle 31G X 5 MM MISC Lantus 18 units daily 08/15/20  Yes Aline August, MD  insulin regular (NOVOLIN R) 100 units/mL injection Inject 0-0.15 mLs (0-15 Units total) into the skin 3 (three) times daily before meals. CBG 70 - 120: 0 units CBG 121 - 150: 2 units CBG 151 - 200: 3 units CBG 201 - 250: 5 units CBG 251 - 300: 8 units CBG 301 - 350: 11 units CBG 351 - 400: 15 units 05/14/20  Yes Sheikh, Omair Latif, DO  levalbuterol [facility] HFA) 45 MCG/ACT inhaler Inhale 2 puffs into the lungs every 6 (six) hours as needed for wheezing or shortness of breath. 05/28/20  Yes Little Ishikawa, MD  lisinopril (ZESTRIL) 10 MG tablet Take 1 tablet (10 mg total) by mouth daily. 06/10/20  Yes Cherene Altes, MD  metoCLOPramide (REGLAN) 10 MG tablet Take 1 tablet (10 mg total) by mouth 3 (three) times daily before meals. 08/15/20  Yes Aline August, MD  Multiple Vitamin (MULTIVITAMIN WITH MINERALS) TABS tablet Take 1 tablet by mouth daily.   Yes [provider]  ondansetron (ZOFRAN ODT) 4 MG disintegrating tablet Take 1 tablet (4 mg total) by mouth every 6 (six) hours as needed for nausea or vomiting. 04/20/20  Yes Esterwood, Amy S, PA-C  promethazine (PHENERGAN) 12.5 MG tablet Take 1 tablet (12.5 mg total) by mouth every 6 (six) hours as needed for nausea or vomiting. 05/14/20  Yes Alfredia Ferguson, Omair [place], DO  TRUEPLUS LANCETS 28G MISC assist with checking blood sugar TID and qhs 09/10/15  Yes Noel, Tiffany S, PA-C  insulin aspart (NOVOLOG) 100 UNIT/ML FlexPen Inject 5 Units into the skin 3 (three) times daily with meals. 02/23/18 07/05/19  Donne Hazel, MD    Physical Exam: Vitals:   09/08/20 2230 09/08/20 2300 09/08/20 2330 09/09/20 0045  BP: (!) 136/115 (!) 153/95 109/68 (!) 91/57  Pulse: (!) 115 (!) 124 (!) 108 (!) 109  Resp: _0 (!) 24  Temp:      TempSrc:      SpO2: 100% 100% (!) 89% 99%    Constitutional: Ill appearing, fatigued Eyes: PERRL, lids and conjunctivae normal ENMT: Mucous membranes are moist. Posterior pharynx clear of any exudate or lesions.Normal dentition.  Neck: normal, supple, no masses, no thyromegaly Respiratory: clear to auscultation bilaterally, no wheezing, no crackles. Normal respiratory effort. No accessory muscle use.  Cardiovascular: Mild tachycardia Abdomen: Mild epigastric TTP Musculoskeletal: no clubbing / cyanosis. No joint deformity upper and lower extremities. Good ROM, no contractures. Normal muscle tone.  Skin: no rashes, lesions, ulcers. No induration Neurologic: CN 2-12 grossly intact. Sensation intact, DTR normal. Strength 5/5  in all 4.  Psychiatric: Normal judgment and insight. Alert and oriented x 3. Normal mood.    Labs on Admission: I have personally reviewed following labs and imaging studies  CBC: Recent Labs  Lab 09/07/20 1444 09/08/20 1327 09/09/20 0010  WBC 6.8 12.1* 13.1*  NEUTROABS 4.8 9.6*  --   HGB 11.9* 11.9* 9.1*  HCT 35.9* 35.4* 27.0*  MCV 88.9 89.2 89.4  PLT 395 395 662   Basic Metabolic Panel: Recent Labs  Lab 09/07/20 1444 09/08/20 1327 09/09/20 0010  NA 135 135 139  K 4.4 3.8 3.9  CL 100 95* 103  CO2 _0 GLUCOSE 342* 378* 160*  BUN 24* 34* 32*  CREATININE 1.15* 1.28* 0.96  CALCIUM 10.0 10.1 8.9   GFR: Estimated Creatinine Clearance: 87.2 mL/min (by C-G formula based on SCr of 0.96 mg/dL). Liver Function Tests: Recent Labs  Lab 09/07/20 1444 09/08/20 1327  AST 26 22  ALT 30 27  ALKPHOS 83 71  BILITOT 1.1 1.7*  PROT 7.9 7.9  ALBUMIN 4.1 4.1   Recent Labs  Lab 09/07/20 1444 09/08/20 1327  LIPASE 24 25   No results for input(s): AMMONIA in the last 168 hours. Coagulation Profile: No results for input(s): INR, PROTIME in the last 168 hours. Cardiac Enzymes: No results for input(s): CKTOTAL, CKMB, CKMBINDEX,  TROPONINI in the last 168 hours. BNP (last 3 results) No results for input(s): PROBNP in the last 8760 hours. HbA1C: No results for input(s): HGBA1C in the last 72 hours. CBG: Recent Labs  Lab 09/08/20 0059 09/08/20 1319 09/09/20 0026  GLUCAP 317* 372* 152*   Lipid Profile: No results for input(s): CHOL, HDL, LDLCALC, TRIG, CHOLHDL, LDLDIRECT in the last 72 hours. Thyroid Function Tests: No results for input(s): TSH, T4TOTAL, FREET4, T3FREE, THYROIDAB in the last 72 hours. Anemia Panel: No results for input(s): VITAMINB12, FOLATE, FERRITIN, TIBC, IRON, RETICCTPCT in the last 72 hours. Urine analysis:    Component Value Date/Time   COLORURINE YELLOW 09/08/2020 2250   APPEARANCEUR CLEAR 09/08/2020 2250   LABSPEC 1.021 09/08/2020 2250   PHURINE 5.0 09/08/2020 2250   GLUCOSEU >=500 (A) 09/08/2020 2250   GLUCOSEU NEGATIVE 05/22/2020 1417   HGBUR SMALL (A) 09/08/2020 2250   BILIRUBINUR NEGATIVE 09/08/2020 2250   KETONESUR 20 (A) 09/08/2020 2250   PROTEINUR >=300 (A) 09/08/2020 2250   UROBILINOGEN 0.2 05/22/2020 1417   NITRITE NEGATIVE 09/08/2020 2250   LEUKOCYTESUR NEGATIVE 09/08/2020 2250    Radiological Exams on Admission: DG Abdomen Acute W/Chest  Result Date: 09/08/2020 CLINICAL DATA:  Vomiting EXAM: DG ABDOMEN ACUTE WITH 1 VIEW CHEST COMPARISON:  None. FINDINGS: There is no evidence of dilated bowel loops or free intraperitoneal air. No radiopaque calculi or other significant radiographic abnormality is seen. Heart size and mediastinal contours are within normal limits. Both lungs are clear. IMPRESSION: Negative abdominal radiographs.  No acute cardiopulmonary disease. Electronically Signed   By: Ulyses Jarred M.D.   On: 09/08/2020 21:20    EKG: Independently reviewed.  Assessment/Plan Principal Problem:   Diabetic gastroparesis associated with type 1 diabetes mellitus ([place]) Active Problems:   Essential hypertension    Diabetic gastroparesis - Reglan 22m IV  Q8H Erythromycin 2550mIV Q6H NPO IVF: 2L bolus and NS at 125 Repeat BMP in AM HGB drop? HGB 11.9 at 1pm, now 9.1 at MN Not clear if true HGB drop vs dilution from large volume IVF in ED vs diluted specimen due to drawing from PIV (which pt indicates they  did). Repeat H/H stat, told them to get a new stick for this SCDs for DVT ppx Tele monitor for tachycardia DM1 - Lantus 18u daily SSI sensitive Q4H while NPO HTN - Holding home BP meds given soft BPs in ED  DVT prophylaxis: SCDs Code Status: Full Family Communication: Mother at bedside Disposition Plan: Home after gastroparesis resolved (and HGB confirmed stable) Consults called: None Admission status: Place in obs    , [place] Hospitalists  How to contact the [facility] Attending or Consulting provider [place] or covering provider during after hours [place], for this patient?  Check the care team in [facility] and look for a) attending/consulting [facility] provider listed and b) the [facility] team listed Log into [email or web address]  Amion Physician Scheduling and messaging for groups and whole hospitals  On call and physician scheduling software for group practices, residents, hospitalists and other medical providers for call, clinic, rotation and shift schedules. OnCall Enterprise is a hospital-wide system for scheduling doctors and paging doctors on call. EasyPlot is for scientific plotting and data analysis.  [email or web address]  and use [place]'s universal password to access. If you do not have the password, please contact the hospital operator.  Locate the [facility] provider you are looking for under [facility] and page to a number that you can be directly reached. If you still have difficulty reaching the provider, please page the [facility] (Director on Call) for the Hospitalists listed on amion for assistance.  09/09/2020, 1:25 AM

## 2020-09-09 NOTE — Progress Notes (Addendum)
PROGRESS NOTE    Yevette Knust  TLX:[id number] DOB: 06-Sep-1990 DOA: 09/08/2020 PCP: Loyola Mast, MD    Chief Complaint  Patient presents with   Hyperglycemia    Brief Narrative:  Max Romano is a 30 yo w/PMHx of T1DM c/b gastroparesis, HTN who presents for worsening flare up after recent discharge from ED.   Assessment & Plan:   Principal Problem:   Diabetic gastroparesis associated with type 1 diabetes mellitus (HCC) Active Problems:   Essential hypertension   #Diabetic gastroparesis  Frequent flares throughout the day at baseline. Typically manages with medication and rest. Rates initial pain as 15/10, now improving - Continue reglan TID - c/w erythromycin 250mg  IV q6h - will advance to FLD and advance as tolerated - if tolerating PO, can wean IVF  - trend BMP  #Anemia  Overall, suspect dilution and frequent sticks.  - will send iron labs, suspect some degree of iron deficiency  #Sinus tachycardia Per prior chart review, patient has baseline sinus tach as seen at outpatient appointments. Suspect worsening related to pain/acute flare. - will dc telemetry - c/w IVF and wean with improving PO  #DM1 - Lantus 18u daily - SSI sensitive Q4H, can space to qac/hs if tolerating PO  #HTN Holding home BP meds given soft BPs in ED   DVT prophylaxis: SCDs Code Status: full code Family Communication: discussed with patient Disposition:   Status is: Observation  The patient remains OBS appropriate and will d/c before 2 midnights.  Dispo: The patient is from: Home              Anticipated d/c is to: Home              Patient currently is not medically stable to d/c.   Difficult to place patient No   Consultants:  none  Procedures: none  Antimicrobials:  none    Subjective: Overnight, no acute events. Reports her pain is down from 15 to 5/10. No vomiting. Would like to trial eating.  Objective: Vitals:   09/09/20 0100 09/09/20 0205  09/09/20 0600 09/09/20 0815  BP: (!) 108/59 110/79 135/90 (!) 147/94  Pulse: (!) 110 (!) 106 (!) 112 (!) 118  Resp: 13 13 10 12   Temp:      TempSrc:      SpO2: 98% 100% 97% 99%    Intake/Output Summary (Last 24 hours) at 09/09/2020 1050 Last data filed at 09/08/2020 2359 Gross per 24 hour  Intake 3000 ml  Output --  Net 3000 ml   There were no vitals filed for this visit.  Examination: General exam: Appears calm and comfortable  Respiratory system: Clear to auscultation. Respiratory effort normal. Cardiovascular system: S1 & S2 heard, tachycardia,RR. No JVD, murmurs, rubs, gallops or clicks. No pedal edema. Gastrointestinal system: Abdomen is nondistended, mildly tender. No organomegaly or masses felt.   Central nervous system: Alert and oriented. No focal neurological deficits. Extremities: Normal muscle tone/bulk Skin: No rashes, lesions or ulcers Psychiatry: Judgement and insight appear normal. Mood & affect appropriate.    Data Reviewed: I have personally reviewed following labs and imaging studies  CBC: Recent Labs  Lab 09/07/20 1444 09/08/20 1327 09/09/20 0010 09/09/20 0124 09/09/20 0925  WBC 6.8 12.1* 13.1*  --   --   NEUTROABS 4.8 9.6*  --   --   --   HGB 11.9* 11.9* 9.1* 9.3* 10.3*  HCT 35.9* 35.4* 27.0* 28.7* 30.9*  MCV 88.9 89.2 89.4  --   --  PLT 395 395 325  --   --     Basic Metabolic Panel: Recent Labs  Lab 09/07/20 1444 09/08/20 1327 09/09/20 0010  NA 135 135 139  K 4.4 3.8 3.9  CL 100 95* 103  CO2 23 22 23   GLUCOSE 342* 378* 160*  BUN 24* 34* 32*  CREATININE 1.15* 1.28* 0.96  CALCIUM 10.0 10.1 8.9    GFR: Estimated Creatinine Clearance: 87.2 mL/min (by C-G formula based on SCr of 0.96 mg/dL).  Liver Function Tests: Recent Labs  Lab 09/07/20 1444 09/08/20 1327  AST 26 22  ALT 30 27  ALKPHOS 83 71  BILITOT 1.1 1.7*  PROT 7.9 7.9  ALBUMIN 4.1 4.1    CBG: Recent Labs  Lab 09/08/20 0059 09/08/20 1319 09/09/20 0026  09/09/20 0412 09/09/20 0810  GLUCAP 317* 372* 152* 141* 149*     Recent Results (from the past 240 hour(s))  Resp Panel by RT-PCR (Flu A&B, Covid) Nasopharyngeal Swab     Status: None   Collection Time: 09/07/20 11:10 PM   Specimen: Nasopharyngeal Swab; Nasopharyngeal(NP) swabs in vial transport medium  Result Value Ref Range Status   SARS Coronavirus 2 by RT PCR NEGATIVE NEGATIVE Final    Comment: (NOTE) SARS-CoV-2 target nucleic acids are NOT DETECTED.  The SARS-CoV-2 RNA is generally detectable in upper respiratory specimens during the acute phase of infection. The lowest concentration of SARS-CoV-2 viral copies this assay can detect is 138 copies/mL. A negative result does not preclude SARS-Cov-2 infection and should not be used as the sole basis for treatment or other patient management decisions. A negative result may occur with  improper specimen collection/handling, submission of specimen other than nasopharyngeal swab, presence of viral mutation(s) within the areas targeted by this assay, and inadequate number of viral copies(<138 copies/mL). A negative result must be combined with clinical observations, patient history, and epidemiological information. The expected result is Negative.  Fact Sheet for Patients:  09/09/20  Fact Sheet for Healthcare Providers:  BloggerCourse.com  This test is no t yet approved or cleared by the [employer]ious[employer].[place] FDA and  has been authorized for detection and/or diagnosis of SARS-CoV-2 by FDA under an Emergency Use Authorization (EUA). This EUA will remain  in effect (meaning this test can be used) for the duration of the COVID-19 declaration under Section 564(b)(1) of the Act, 21 U.S.C.section 360bbb-3(b)(1), unless the authorization is terminated  or revoked sooner.       Influenza A by PCR NEGATIVE NEGATIVE Final   Influenza B by PCR NEGATIVE NEGATIVE Final    Comment:  (NOTE) The Xpert Xpress SARS-CoV-2/FLU/RSV plus assay is intended as an aid in the diagnosis of influenza from Nasopharyngeal swab specimens and should not be used as a sole basis for treatment. Nasal washings and aspirates are unacceptable for Xpert Xpress SARS-CoV-2/FLU/RSV testing.  Fact Sheet for Patients: [place]  Fact Sheet for Healthcare Providers: BloggerCourse.com  This test is not yet approved or cleared by the SeriousBroker.it FDA and has been authorized for detection and/or diagnosis of SARS-CoV-2 by FDA under an Emergency Use Authorization (EUA). This EUA will remain in effect (meaning this test can be used) for the duration of the COVID-19 declaration under Section 564(b)(1) of the Act, 21 U.S.C. section 360bbb-3(b)(1), unless the authorization is terminated or revoked.  Performed at [facility], [address], [place], [place]      Radiology Studies: DG Abdomen Acute W/Chest  Result Date: 09/08/2020 CLINICAL DATA:  Vomiting  EXAM: DG ABDOMEN ACUTE WITH 1 VIEW CHEST COMPARISON:  None. FINDINGS: There is no evidence of dilated bowel loops or free intraperitoneal air. No radiopaque calculi or other significant radiographic abnormality is seen. Heart size and mediastinal contours are within normal limits. Both lungs are clear. IMPRESSION: Negative abdominal radiographs.  No acute cardiopulmonary disease. Electronically Signed   By: Deatra Robinson M.D.   On: 09/08/2020 21:20        Scheduled Meds:  famotidine  20 mg Oral BID   insulin aspart  0-9 Units Subcutaneous Q4H   insulin glargine  18 Units Subcutaneous Daily   metoCLOPramide (REGLAN) injection  10 mg Intravenous Q8H   multivitamin with minerals  1 tablet Oral Daily   pantoprazole (PROTONIX) IV  40 mg Intravenous Q12H   Continuous Infusions:  sodium chloride 125 mL/hr at 09/09/20 0000   erythromycin 250 mg (09/09/20 0531)      LOS: 0 days    Rachael Fee, MD MPH [facility]   To contact the attending physician between 7A-7P or the covering provider during after hours 7P-7A, please log into the web site [email or web address] and access using universal [place] password for that web site. If you do not have the password, please call the hospital operator.  09/09/2020, 10:50 AM

## 2020-09-10 LAB — HEMOGLOBIN AND HEMATOCRIT, BLOOD
HCT: 30.1 % — ABNORMAL LOW (ref 36.0–46.0)
HCT: 27.9 % — ABNORMAL LOW (ref 36.0–46.0)
HCT: 34.4 % — ABNORMAL LOW (ref 36.0–46.0)
HCT: 33.2 % — ABNORMAL LOW (ref 36.0–46.0)
Hemoglobin: 10 g/dL — ABNORMAL LOW (ref 12.0–15.0)
Hemoglobin: 9.6 g/dL — ABNORMAL LOW (ref 12.0–15.0)
Hemoglobin: 11.8 g/dL — ABNORMAL LOW (ref 12.0–15.0)
Hemoglobin: 11.2 g/dL — ABNORMAL LOW (ref 12.0–15.0)

## 2020-09-10 LAB — GLUCOSE, CAPILLARY
Glucose-Capillary: 127 mg/dL — ABNORMAL HIGH (ref 70–99)
Glucose-Capillary: 82 mg/dL (ref 70–99)
Glucose-Capillary: 162 mg/dL — ABNORMAL HIGH (ref 70–99)
Glucose-Capillary: 137 mg/dL — ABNORMAL HIGH (ref 70–99)
Glucose-Capillary: 77 mg/dL (ref 70–99)
Glucose-Capillary: 118 mg/dL — ABNORMAL HIGH (ref 70–99)

## 2020-09-10 LAB — HEMOGLOBIN A1C
Hgb A1c MFr Bld: 10.5 % — ABNORMAL HIGH (ref 4.8–5.6)
Mean Plasma Glucose: 255 mg/dL

## 2020-09-10 MED ORDER — ATENOLOL 25 MG PO TABS
25.0000 mg | ORAL_TABLET | Freq: Every day | ORAL | Status: DC
  Administered 2020-09-10 – 2020-09-11 (×2): 25 mg via ORAL
  Filled 2020-09-10 (×2): qty 1

## 2020-09-10 MED ORDER — HYDROMORPHONE HCL 1 MG/ML IJ SOLN
0.5000 mg | Freq: Four times a day (QID) | INTRAMUSCULAR | Status: DC | PRN
Start: 2020-09-10 — End: 2020-09-11
  Administered 2020-09-10: 0.5 mg via INTRAVENOUS
  Filled 2020-09-10 (×2): qty 0.5

## 2020-09-10 MED ORDER — ATENOLOL 25 MG PO TABS: 25.0000 mg | ORAL_TABLET | Freq: Two times a day (BID) | ORAL | Status: DC

## 2020-09-10 MED ORDER — KETOROLAC TROMETHAMINE 30 MG/ML IJ SOLN
30.0000 mg | Freq: Four times a day (QID) | INTRAMUSCULAR | Status: DC | PRN
  Administered 2020-09-10 (×2): 30 mg via INTRAVENOUS
  Filled 2020-09-10 (×2): qty 1

## 2020-09-10 MED ORDER — HYDROMORPHONE HCL 1 MG/ML IJ SOLN
0.5000 mg | Freq: Once | INTRAMUSCULAR | Status: AC
Start: 2020-09-10 — End: 2020-09-10
  Administered 2020-09-10: 0.5 mg via INTRAVENOUS
  Filled 2020-09-10: qty 0.5

## 2020-09-10 MED ORDER — HYDROMORPHONE HCL 1 MG/ML IJ SOLN
0.5000 mg | INTRAMUSCULAR | Status: DC | PRN
  Administered 2020-09-10: 0.5 mg via INTRAVENOUS
  Filled 2020-09-10: qty 0.5

## 2020-09-10 MED ORDER — MORPHINE SULFATE (PF) 2 MG/ML IV SOLN
0.5000 mg | Freq: Four times a day (QID) | INTRAVENOUS | Status: DC | PRN
  Administered 2020-09-10: 0.5 mg via INTRAVENOUS
  Filled 2020-09-10 (×3): qty 1

## 2020-09-10 NOTE — Progress Notes (Addendum)
PROGRESS NOTE    Kelli Hudson  PXT:[id number] DOB: 02-04-1991 DOA: 09/08/2020 PCP: Loyola Mast, MD   Brief Narrative: This 30 years old female with PMH significant for DM type I , HTN, diabetic gastroparesis with recurrent flareups presented in the ED with recurrent episode of gastroparesis.  Patient was seen in the ED yesterday for the same,  Patient takes Reglan 10 mg 3 times daily,  recently started on erythromycin yesterday.  She continued to have nausea, vomiting and abdominal pain. Patient was admitted for intractable nausea and vomiting secondary to diabetic gastroparesis.  Assessment & Plan:   Principal Problem:   Diabetic gastroparesis associated with type 1 diabetes mellitus (HCC) Active Problems:   Essential hypertension   Gastroparesis  Intractable nausea and vomiting from diabetic gastroparesis: Patient takes Reglan 10 mg every 8 hours.   Patient was recently started on erythromycin 250 mg IV every 6 hours as needed. Patient has received IV fluids.  She continued to remain nauseous and have vomiting. Continue IV hydration.  Continue Zofran as needed. Recheck electrolytes.  X-ray abdomen unremarkable. Continue Toradol as needed for pain.  DM1: Continue Lantus 18 units daily. continue regular insulin sliding scale  Hypertension :  hold blood pressure medication since her blood pressure is on the lower side.  Anemia. Her hemoglobin dropped from 11.9-9.1 It could be hemodilution.  Recheck H&H.   DVT prophylaxis: SCDs Code Status: Full code. Family Communication: Mother at bed side. Disposition Plan:   Status is: Inpatient  Remains inpatient appropriate because:Inpatient level of care appropriate due to severity of illness  Dispo: The patient is from: Home              Anticipated d/c is to: Home              Patient currently is not medically stable to d/c.   Difficult to place patient No   Consultants:  None  Procedures:  None  Antimicrobials: None  Subjective: Patient was seen and examined at bedside.  Overnight events noted.   Patient reports having severe abdominal pain,  still feeling nauseous and throwing up.  Objective: Vitals:   09/10/20 0201 09/10/20 0409 09/10/20 0756 09/10/20 1106  BP: 125/81 (!) 151/104 (!) 154/112 (!) 167/113  Pulse: (!) 105 (!) 109 (!) 107 (!) 102  Resp: 19 12 14 16   Temp: 98.5 F (36.9 C) 98.1 F (36.7 C) 98.9 F (37.2 C) 98.7 F (37.1 C)  TempSrc: Oral Oral Oral   SpO2: 98% 99% 99% 100%  Weight:      Height:        Intake/Output Summary (Last 24 hours) at 09/10/2020 1411 Last data filed at 09/10/2020 09/12/2020 Gross per 24 hour  Intake 872 ml  Output --  Net 872 ml   Filed Weights   09/09/20 2233  Weight: 74.8 kg    Examination:  General exam: Appears calm and comfortable , does not appear in a lot of pain. Respiratory system: Clear to auscultation. Respiratory effort normal. Cardiovascular system: S1 & S2 heard, RRR. No JVD, murmurs, rubs, gallops or clicks. No pedal edema. Gastrointestinal system: Abdomen is nondistended, soft and generally tender. No organomegaly or masses felt. Normal bowel sounds heard. Central nervous system: Alert and oriented. No focal neurological deficits. Extremities: Symmetric 5 x 5 power.  No edema, no cyanosis, no clubbing. Skin: No rashes, lesions or ulcers Psychiatry: Judgement and insight appear normal. Mood & affect appropriate.     Data Reviewed: I have personally reviewed  following labs and imaging studies  CBC: Recent Labs  Lab 09/07/20 1444 09/08/20 1327 09/09/20 0010 09/09/20 0124 09/09/20 0925 09/09/20 1543 09/09/20 2000 09/10/20 0142 09/10/20 0746  WBC 6.8 12.1* 13.1*  --   --  11.2*  --   --   --   NEUTROABS 4.8 9.6*  --   --   --  9.5*  --   --   --   HGB 11.9* 11.9* 9.1*   < > 10.3* 11.7* 10.9* 10.0* 9.6*  HCT 35.9* 35.4* 27.0*   < > 30.9* 34.8* 32.5* 30.1* 27.9*  MCV 88.9 89.2 89.4  --   --  88.5   --   --   --   PLT 395 395 325  --   --  375  --   --   --    < > = values in this interval not displayed.   Basic Metabolic Panel: Recent Labs  Lab 09/07/20 1444 09/08/20 1327 09/09/20 0010  NA 135 135 139  K 4.4 3.8 3.9  CL 100 95* 103  CO2 23 22 23   GLUCOSE 342* 378* 160*  BUN 24* 34* 32*  CREATININE 1.15* 1.28* 0.96  CALCIUM 10.0 10.1 8.9   GFR: Estimated Creatinine Clearance: 87.2 mL/min (by C-G formula based on SCr of 0.96 mg/dL). Liver Function Tests: Recent Labs  Lab 09/07/20 1444 09/08/20 1327  AST 26 22  ALT 30 27  ALKPHOS 83 71  BILITOT 1.1 1.7*  PROT 7.9 7.9  ALBUMIN 4.1 4.1   Recent Labs  Lab 09/07/20 1444 09/08/20 1327  LIPASE 24 25   No results for input(s): AMMONIA in the last 168 hours. Coagulation Profile: No results for input(s): INR, PROTIME in the last 168 hours. Cardiac Enzymes: No results for input(s): CKTOTAL, CKMB, CKMBINDEX, TROPONINI in the last 168 hours. BNP (last 3 results) No results for input(s): PROBNP in the last 8760 hours. HbA1C: Recent Labs    09/09/20 0010  HGBA1C 10.5*   CBG: Recent Labs  Lab 09/09/20 1948 09/09/20 2348 09/10/20 0501 09/10/20 0753 09/10/20 1142  GLUCAP 183* 124* 127* 82 162*   Lipid Profile: No results for input(s): CHOL, HDL, LDLCALC, TRIG, CHOLHDL, LDLDIRECT in the last 72 hours. Thyroid Function Tests: No results for input(s): TSH, T4TOTAL, FREET4, T3FREE, THYROIDAB in the last 72 hours. Anemia Panel: Recent Labs    09/09/20 1100  FERRITIN 34  TIBC 493*  IRON 96   Sepsis Labs: No results for input(s): PROCALCITON, LATICACIDVEN in the last 168 hours.  Recent Results (from the past 240 hour(s))  Resp Panel by RT-PCR (Flu A&B, Covid) Nasopharyngeal Swab     Status: None   Collection Time: 09/07/20 11:10 PM   Specimen: Nasopharyngeal Swab; Nasopharyngeal(NP) swabs in vial transport medium  Result Value Ref Range Status   SARS Coronavirus 2 by RT PCR NEGATIVE NEGATIVE Final     Comment: (NOTE) SARS-CoV-2 target nucleic acids are NOT DETECTED.  The SARS-CoV-2 RNA is generally detectable in upper respiratory specimens during the acute phase of infection. The lowest concentration of SARS-CoV-2 viral copies this assay can detect is 138 copies/mL. A negative result does not preclude SARS-Cov-2 infection and should not be used as the sole basis for treatment or other patient management decisions. A negative result may occur with  improper specimen collection/handling, submission of specimen other than nasopharyngeal swab, presence of viral mutation(s) within the areas targeted by this assay, and inadequate number of viral copies(<138 copies/mL). A negative result must  be combined with clinical observations, patient history, and epidemiological information. The expected result is Negative.  Fact Sheet for Patients:  BloggerCourse.comhttps://www.fda.gov/media/152166/download  Fact Sheet for Healthcare Providers:  SeriousBroker.ithttps://www.fda.gov/media/152162/download  This test is no t yet approved or cleared by the [place] FDA and  has been authorized for detection and/or diagnosis of SARS-CoV-2 by FDA under an Emergency Use Authorization (EUA). This EUA will remain  in effect (meaning this test can be used) for the duration of the COVID-19 declaration under Section 564(b)(1) of the Act, 21 U.S.C.section 360bbb-3(b)(1), unless the authorization is terminated  or revoked sooner.       Influenza A by PCR NEGATIVE NEGATIVE Final   Influenza B by PCR NEGATIVE NEGATIVE Final    Comment: (NOTE) The Xpert Xpress SARS-CoV-2/FLU/RSV plus assay is intended as an aid in the diagnosis of influenza from Nasopharyngeal swab specimens and should not be used as a sole basis for treatment. Nasal washings and aspirates are unacceptable for Xpert Xpress SARS-CoV-2/FLU/RSV testing.  Fact Sheet for Patients: BloggerCourse.comhttps://www.fda.gov/media/152166/download  Fact Sheet for Healthcare  Providers: SeriousBroker.ithttps://www.fda.gov/media/152162/download  This test is not yet approved or cleared by the [place] FDA and has been authorized for detection and/or diagnosis of SARS-CoV-2 by FDA under an Emergency Use Authorization (EUA). This EUA will remain in effect (meaning this test can be used) for the duration of the COVID-19 declaration under Section 564(b)(1) of the Act, 21 U.S.C. section 360bbb-3(b)(1), unless the authorization is terminated or revoked.  Performed at [facility], [address] [place], [place] [address]   SARS CORONAVIRUS 2 (TAT 6-24 HRS) Nasopharyngeal Nasopharyngeal Swab     Status: None   Collection Time: 09/09/20  9:13 AM   Specimen: Nasopharyngeal Swab  Result Value Ref Range Status   SARS Coronavirus 2 NEGATIVE NEGATIVE Final    Comment: (NOTE) SARS-CoV-2 target nucleic acids are NOT DETECTED.  The SARS-CoV-2 RNA is generally detectable in upper and lower respiratory specimens during the acute phase of infection. Negative results do not preclude SARS-CoV-2 infection, do not rule out co-infections with other pathogens, and should not be used as the sole basis for treatment or other patient management decisions. Negative results must be combined with clinical observations, patient history, and epidemiological information. The expected result is Negative.  Fact Sheet for Patients: HairSlick.nohttps://www.fda.gov/media/138098/download  Fact Sheet for Healthcare Providers: quierodirigir.comhttps://www.fda.gov/media/138095/download  This test is not yet approved or cleared by the [place] FDA and  has been authorized for detection and/or diagnosis of SARS-CoV-2 by FDA under an Emergency Use Authorization (EUA). This EUA will remain  in effect (meaning this test can be used) for the duration of the COVID-19 declaration under Se ction 564(b)(1) of the Act, 21 U.S.C. section 360bbb-3(b)(1), unless the authorization is terminated or revoked  sooner.  Performed at [facility], [address] [place], [place] [address]     Radiology Studies: DG Abdomen Acute W/Chest  Result Date: 09/08/2020 CLINICAL DATA:  Vomiting EXAM: DG ABDOMEN ACUTE WITH 1 VIEW CHEST COMPARISON:  None. FINDINGS: There is no evidence of dilated bowel loops or free intraperitoneal air. No radiopaque calculi or other significant radiographic abnormality is seen. Heart size and mediastinal contours are within normal limits. Both lungs are clear. IMPRESSION: Negative abdominal radiographs.  No acute cardiopulmonary disease. Electronically Signed   By: Deatra RobinsonKevin  Herman M.D.   On: 09/08/2020 21:20    Scheduled Meds:  atenolol  25 mg Oral Daily   famotidine  20 mg Oral BID  insulin aspart  0-9 Units Subcutaneous Q4H   insulin glargine  18 Units Subcutaneous Daily   metoCLOPramide (REGLAN) injection  10 mg Intravenous Q8H   multivitamin with minerals  1 tablet Oral Daily   pantoprazole (PROTONIX) IV  40 mg Intravenous Q12H   Continuous Infusions:  sodium chloride 1,000 mL (09/10/20 1116)   erythromycin 250 mg (09/10/20 1337)   promethazine (PHENERGAN) injection (IM or IVPB) 12.5 mg (09/10/20 0952)     LOS: 1 day    Time spent: 35 mins    Lyrah Bradt, MD [facility]   If 7PM-7AM, please contact night-coverage

## 2020-09-11 LAB — CBC
HCT: 30.9 % — ABNORMAL LOW (ref 36.0–46.0)
Hemoglobin: 10.3 g/dL — ABNORMAL LOW (ref 12.0–15.0)
MCH: 29.4 pg (ref 26.0–34.0)
MCHC: 33.3 g/dL (ref 30.0–36.0)
MCV: 88.3 fL (ref 80.0–100.0)
Platelets: 309 10*3/uL (ref 150–400)
RBC: 3.5 MIL/uL — ABNORMAL LOW (ref 3.87–5.11)
RDW: 12.7 % (ref 11.5–15.5)
WBC: 9.7 10*3/uL (ref 4.0–10.5)
nRBC: 0 % (ref 0.0–0.2)

## 2020-09-11 LAB — BASIC METABOLIC PANEL
Anion gap: 10 (ref 5–15)
BUN: 12 mg/dL (ref 6–20)
CO2: 24 mmol/L (ref 22–32)
Calcium: 8.9 mg/dL (ref 8.9–10.3)
Chloride: 100 mmol/L (ref 98–111)
Creatinine, Ser: 0.84 mg/dL (ref 0.44–1.00)
GFR, Estimated: >60 mL/min (ref >60)
Glucose, Bld: 89 mg/dL (ref 70–99)
Potassium: 4.1 mmol/L (ref 3.5–5.1)
Sodium: 134 mmol/L — ABNORMAL LOW (ref 135–145)

## 2020-09-11 LAB — HEMOGLOBIN AND HEMATOCRIT, BLOOD
HCT: 30.4 % — ABNORMAL LOW (ref 36.0–46.0)
Hemoglobin: 10.4 g/dL — ABNORMAL LOW (ref 12.0–15.0)

## 2020-09-11 LAB — PHOSPHORUS: Phosphorus: 3.5 mg/dL (ref 2.5–4.6)

## 2020-09-11 LAB — GLUCOSE, CAPILLARY
Glucose-Capillary: 86 mg/dL (ref 70–99)
Glucose-Capillary: 102 mg/dL — ABNORMAL HIGH (ref 70–99)
Glucose-Capillary: 141 mg/dL — ABNORMAL HIGH (ref 70–99)

## 2020-09-11 LAB — MAGNESIUM: Magnesium: 1.6 mg/dL — ABNORMAL LOW (ref 1.7–2.4)

## 2020-09-11 MED ORDER — MAGNESIUM SULFATE 2 GM/50ML IV SOLN
2.0000 g | Freq: Once | INTRAVENOUS | Status: AC
  Administered 2020-09-11: 2 g via INTRAVENOUS
  Filled 2020-09-11: qty 50

## 2020-09-11 NOTE — Progress Notes (Signed)
Went over discharge instructions w/ pt. Pt agreed w/ plan.  

## 2020-09-11 NOTE — Discharge Instructions (Signed)
Advised to continue erythromycin and Reglan as needed for nausea and vomiting.

## 2020-09-11 NOTE — Consult Note (Signed)
   [facility] Inpatient Consult   09/11/2020  Chloeann Alfred 1990/12/29 [id number]  *Coverage for Christophe Louis, [facility] Liaison [facility]  [facility] [[facility]]  Accountable Care Organization [ACO] Patient:  Patient was screened for [facility] [[facility]]  Care Management services. Patient will have the transition of care call conducted by the primary care provider. This patient is active also in an Embedded practice  chronic care disease management Embedded Care Management team.  Plan: Notification sent to the [facility] RN Care Coordinator of Embedded Care Management and made aware of transitioning home.   Please contact for further questions,  Charlesetta Shanks, RN BSN CCM [facility]  [phone] business mobile phone Toll free office [phone]  Fax number: [phone] [email or web address]

## 2020-09-11 NOTE — Discharge Summary (Signed)
Physician Discharge Summary  Bliss Behnke ORV:[id number] DOB: Jul 07, 1990 DOA: 09/08/2020  PCP: [name], MD  Admit date: 09/08/2020  Discharge date: 09/11/2020  Admitted From: Home.  Disposition:  Home.  Recommendations for Outpatient Follow-up:  Follow up with PCP in 1-2 weeks. Please obtain BMP/CBC in one week. Advised to continue erythromycin and Reglan as needed for nausea and vomiting.  Home Health: None Equipment/Devices:None  Discharge Condition: Good CODE STATUS:Full code Diet recommendation:  Soft Blend diet.  Brief Summary/ Hospital Course: This 30 years old female with PMH significant for DM type I , HTN, diabetic gastroparesis with recurrent flareups presented in the ED with recurrent episode of gastroparesis. Patient was seen in the ED one day before for the same symptoms,  Patient takes Reglan 10 mg 3 times daily,  recently started on erythromycin and discharged home. She continued to have nausea, vomiting and abdominal pain. Patient was admitted for intractable nausea and vomiting secondary to diabetic gastroparesis.  Patient was continued on Reglan and erythromycin was added.  Patient was kept NPO and was slowly started on clear liquid diet, tolerated well. She was advanced to full liquids,  tolerated well then advanced to soft bland diet.  Electrolytes normalized.  She was given IV hydration.  Patient feels better and wants to be discharged.  She is being discharged  She was managed for below problems  Discharge Diagnoses:  Principal Problem:   Diabetic gastroparesis associated with type 1 diabetes mellitus ([place]) Active Problems:   Essential hypertension   Gastroparesis  Intractable nausea and vomiting from diabetic gastroparesis: Patient takes Reglan 10 mg every 8 hours.  she was recently started on erythromycin. Started on erythromycin 250 mg IV every 6 hours as needed. Patient has received IV fluids.  She continued to remain nauseous and have  vomiting. Continue IV hydration.  Continue Zofran as needed. Recheck electrolytes.  X-ray abdomen unremarkable. Continue Toradol as needed for pain. Patient tolerated clear liquid diet advance to full liquids she tolerated well. Nausea and vomiting has improved.  Patient wants to be discharged.   DM1: Continue Lantus 18 units daily. continue regular insulin sliding scale   Hypertension :  hold blood pressure medication since her blood pressure is on the lower side.   Anemia. Her hemoglobin dropped from 11.9-9.1 Hb remains stable.     Discharge Instructions  Discharge Instructions     Call MD for:  difficulty breathing, headache or visual disturbances   Complete by: As directed    Call MD for:  persistant dizziness or light-headedness   Complete by: As directed    Call MD for:  persistant nausea and vomiting   Complete by: As directed    Diet - low sodium heart healthy   Complete by: As directed    Diet full liquid   Complete by: As directed    Discharge instructions   Complete by: As directed    Advised to follow-up with primary care physician in 1 week. Advised to continue erythromycin and Reglan as needed for nausea and vomiting.   Increase activity slowly   Complete by: As directed       Allergies as of 09/11/2020   No Known Allergies      Medication List     TAKE these medications    atenolol 25 MG tablet Commonly known as: TENORMIN Take 1 tablet (25 mg total) by mouth 2 (two) times daily.   erythromycin 250 MG tablet Commonly known as: E-MYCIN Take 1 tablet (250 mg  total) by mouth 2 (two) times daily.   famotidine 20 MG tablet Commonly known as: PEPCID Take 20 mg by mouth 2 (two) times daily.   hyoscyamine 0.375 MG 12 hr tablet Commonly known as: LEVBID Take 1 tablet (0.375 mg total) by mouth every 12 (twelve) hours as needed for cramping (abdominal pain).   insulin glargine 100 UNIT/ML injection Commonly known as: LANTUS Inject 0.18 mLs (18  Units total) into the skin daily.   Insulin Pen Needle 31G X 5 MM Misc 1 Device by Does not apply route QID. For use with insulin pens   Insulin Pen Needle 31G X 5 MM Misc Lantus 18 units daily   insulin regular 100 units/mL injection Commonly known as: NovoLIN R Inject 0-0.15 mLs (0-15 Units total) into the skin 3 (three) times daily before meals. CBG 70 - 120: 0 units CBG 121 - 150: 2 units CBG 151 - 200: 3 units CBG 201 - 250: 5 units CBG 251 - 300: 8 units CBG 301 - 350: 11 units CBG 351 - 400: 15 units   levalbuterol 45 MCG/ACT inhaler Commonly known as: XOPENEX HFA Inhale 2 puffs into the lungs every 6 (six) hours as needed for wheezing or shortness of breath.   lisinopril 10 MG tablet Commonly known as: ZESTRIL Take 1 tablet (10 mg total) by mouth daily.   metoCLOPramide 10 MG tablet Commonly known as: Reglan Take 1 tablet (10 mg total) by mouth 3 (three) times daily before meals.   multivitamin with minerals Tabs tablet Take 1 tablet by mouth daily.   ondansetron 4 MG disintegrating tablet Commonly known as: Zofran ODT Take 1 tablet (4 mg total) by mouth every 6 (six) hours as needed for nausea or vomiting.   promethazine 12.5 MG tablet Commonly known as: PHENERGAN Take 1 tablet (12.5 mg total) by mouth every 6 (six) hours as needed for nausea or vomiting.   True Metrix Blood Glucose Test test strip Generic drug: glucose blood Use as instructed   True Metrix Meter w/Device Kit 1 Device by Does not apply route 4 (four) times daily.   TRUEplus Lancets 28G Misc assist with checking blood sugar TID and qhs        Follow-up Information     [name], MD Follow up in 1 week(s).   Specialty: Family Medicine Contact information: [place] [address] [phone]                No Known Allergies  Consultations: None   Procedures/Studies: DG Abdomen Acute W/Chest  Result Date: 09/08/2020 CLINICAL DATA:   Vomiting EXAM: DG ABDOMEN ACUTE WITH 1 VIEW CHEST COMPARISON:  None. FINDINGS: There is no evidence of dilated bowel loops or free intraperitoneal air. No radiopaque calculi or other significant radiographic abnormality is seen. Heart size and mediastinal contours are within normal limits. Both lungs are clear. IMPRESSION: Negative abdominal radiographs.  No acute cardiopulmonary disease. Electronically Signed   By: Ulyses Jarred M.D.   On: 09/08/2020 21:20      Subjective: Patient was seen and examined at bedside.  Overnight events noted.  Patient reports feeling much improved.  Nausea and vomiting has improved.  She has tolerated full liquid diet,  advanced to soft bland diet.  She wants to be discharged  Discharge Exam: Vitals:   09/11/20 0515 09/11/20 1038  BP: (!) 132/95 117/81  Pulse: 93 100  Resp: 20   Temp: 98 F (36.7 C)   SpO2: 98%  Vitals:   09/10/20 1801 09/10/20 2004 09/11/20 0515 09/11/20 1038  BP: (!) 147/98 (!) 152/96 (!) 132/95 117/81  Pulse: (!) 106 (!) 107 93 100  Resp: 16 20 20    Temp: 98.8 F (37.1 C) 99 F (37.2 C) 98 F (36.7 C)   TempSrc: Oral Oral Oral   SpO2: 99% 99% 98%   Weight:      Height:        General: Pt is alert, awake, not in acute distress Cardiovascular: RRR, S1/S2 +, no rubs, no gallops Respiratory: CTA bilaterally, no wheezing, no rhonchi Abdominal: Soft, NT, ND, bowel sounds + Extremities: no edema, no cyanosis    The results of significant diagnostics from this hospitalization (including imaging, microbiology, ancillary and laboratory) are listed below for reference.     Microbiology: Recent Results (from the past 240 hour(s))  Resp Panel by RT-PCR (Flu A&B, Covid) Nasopharyngeal Swab     Status: None   Collection Time: 09/07/20 11:10 PM   Specimen: Nasopharyngeal Swab; Nasopharyngeal(NP) swabs in vial transport medium  Result Value Ref Range Status   SARS Coronavirus 2 by RT PCR NEGATIVE NEGATIVE Final    Comment:  (NOTE) SARS-CoV-2 target nucleic acids are NOT DETECTED.  The SARS-CoV-2 RNA is generally detectable in upper respiratory specimens during the acute phase of infection. The lowest concentration of SARS-CoV-2 viral copies this assay can detect is 138 copies/mL. A negative result does not preclude SARS-Cov-2 infection and should not be used as the sole basis for treatment or other patient management decisions. A negative result may occur with  improper specimen collection/handling, submission of specimen other than nasopharyngeal swab, presence of viral mutation(s) within the areas targeted by this assay, and inadequate number of viral copies(<138 copies/mL). A negative result must be combined with clinical observations, patient history, and epidemiological information. The expected result is Negative.  Fact Sheet for Patients:  EntrepreneurPulse.com.au  Fact Sheet for Healthcare Providers:  IncredibleEmployment.be  This test is no t yet approved or cleared by the [place] FDA and  has been authorized for detection and/or diagnosis of SARS-CoV-2 by FDA under an Emergency Use Authorization (EUA). This EUA will remain  in effect (meaning this test can be used) for the duration of the COVID-19 declaration under Section 564(b)(1) of the Act, 21 U.S.C.section 360bbb-3(b)(1), unless the authorization is terminated  or revoked sooner.       Influenza A by PCR NEGATIVE NEGATIVE Final   Influenza B by PCR NEGATIVE NEGATIVE Final    Comment: (NOTE) The Xpert Xpress SARS-CoV-2/FLU/RSV plus assay is intended as an aid in the diagnosis of influenza from Nasopharyngeal swab specimens and should not be used as a sole basis for treatment. Nasal washings and aspirates are unacceptable for Xpert Xpress SARS-CoV-2/FLU/RSV testing.  Fact Sheet for Patients: EntrepreneurPulse.com.au  Fact Sheet for Healthcare  Providers: IncredibleEmployment.be  This test is not yet approved or cleared by the [place] FDA and has been authorized for detection and/or diagnosis of SARS-CoV-2 by FDA under an Emergency Use Authorization (EUA). This EUA will remain in effect (meaning this test can be used) for the duration of the COVID-19 declaration under Section 564(b)(1) of the Act, 21 U.S.C. section 360bbb-3(b)(1), unless the authorization is terminated or revoked.  Performed at [facility], [place] [address], [place], [place] [address]   SARS CORONAVIRUS 2 (TAT 6-24 HRS) Nasopharyngeal Nasopharyngeal Swab     Status: None   Collection Time: 09/09/20  9:13 AM   Specimen: Nasopharyngeal  Swab  Result Value Ref Range Status   SARS Coronavirus 2 NEGATIVE NEGATIVE Final    Comment: (NOTE) SARS-CoV-2 target nucleic acids are NOT DETECTED.  The SARS-CoV-2 RNA is generally detectable in upper and lower respiratory specimens during the acute phase of infection. Negative results do not preclude SARS-CoV-2 infection, do not rule out co-infections with other pathogens, and should not be used as the sole basis for treatment or other patient management decisions. Negative results must be combined with clinical observations, patient history, and epidemiological information. The expected result is Negative.  Fact Sheet for Patients: SugarRoll.be  Fact Sheet for Healthcare Providers: https://www.woods-mathews.com/  This test is not yet approved or cleared by the [place] FDA and  has been authorized for detection and/or diagnosis of SARS-CoV-2 by FDA under an Emergency Use Authorization (EUA). This EUA will remain  in effect (meaning this test can be used) for the duration of the COVID-19 declaration under Se ction 564(b)(1) of the Act, 21 U.S.C. section 360bbb-3(b)(1), unless the authorization is terminated or revoked  sooner.  Performed at [facility], [place] [address], [place], [place] [address]      Labs: BNP (last 3 results) No results for input(s): BNP in the last 8760 hours. Basic Metabolic Panel: Recent Labs  Lab 09/07/20 1444 09/08/20 1327 09/09/20 0010 09/11/20 0129  NA 135 135 139 134*  K 4.4 3.8 3.9 4.1  CL 100 95* 103 100  CO2 23 22 23 24   GLUCOSE 342* 378* 160* 89  BUN 24* 34* 32* 12  CREATININE 1.15* 1.28* 0.96 0.84  CALCIUM 10.0 10.1 8.9 8.9  MG  --   --   --  1.6*  PHOS  --   --   --  3.5   Liver Function Tests: Recent Labs  Lab 09/07/20 1444 09/08/20 1327  AST 26 22  ALT 30 27  ALKPHOS 83 71  BILITOT 1.1 1.7*  PROT 7.9 7.9  ALBUMIN 4.1 4.1   Recent Labs  Lab 09/07/20 1444 09/08/20 1327  LIPASE 24 25   No results for input(s): AMMONIA in the last 168 hours. CBC: Recent Labs  Lab 09/07/20 1444 09/08/20 1327 09/09/20 0010 09/09/20 0124 09/09/20 1543 09/09/20 2000 09/10/20 0746 09/10/20 1404 09/10/20 2025 09/11/20 0129 09/11/20 0729  WBC 6.8 12.1* 13.1*  --  11.2*  --   --   --   --  9.7  --   NEUTROABS 4.8 9.6*  --   --  9.5*  --   --   --   --   --   --   HGB 11.9* 11.9* 9.1*   < > 11.7*   < > 9.6* 11.8* 11.2* 10.3* 10.4*  HCT 35.9* 35.4* 27.0*   < > 34.8*   < > 27.9* 34.4* 33.2* 30.9* 30.4*  MCV 88.9 89.2 89.4  --  88.5  --   --   --   --  88.3  --   PLT 395 395 325  --  375  --   --   --   --  309  --    < > = values in this interval not displayed.   Cardiac Enzymes: No results for input(s): CKTOTAL, CKMB, CKMBINDEX, TROPONINI in the last 168 hours. BNP: Invalid input(s): POCBNP CBG: Recent Labs  Lab 09/10/20 2006 09/10/20 2301 09/11/20 0433 09/11/20 0708 09/11/20 1141  GLUCAP 77 118* 86 102* 141*   D-Dimer No results for input(s): DDIMER in the last 72  hours. Hgb A1c Recent Labs    09/09/20 0010  HGBA1C 10.5*   Lipid Profile No results for input(s): CHOL, HDL, LDLCALC, TRIG, CHOLHDL, LDLDIRECT in the last 72  hours. Thyroid function studies No results for input(s): TSH, T4TOTAL, T3FREE, THYROIDAB in the last 72 hours.  Invalid input(s): FREET3 Anemia work up Recent Labs    09/09/20 1100  FERRITIN 34  TIBC 493*  IRON 96   Urinalysis    Component Value Date/Time   COLORURINE YELLOW 09/08/2020 2250   APPEARANCEUR CLEAR 09/08/2020 2250   LABSPEC 1.021 09/08/2020 2250   PHURINE 5.0 09/08/2020 2250   GLUCOSEU >=500 (A) 09/08/2020 2250   GLUCOSEU NEGATIVE 05/22/2020 1417   HGBUR SMALL (A) 09/08/2020 2250   BILIRUBINUR NEGATIVE 09/08/2020 2250   KETONESUR 20 (A) 09/08/2020 2250   PROTEINUR >=300 (A) 09/08/2020 2250   UROBILINOGEN 0.2 05/22/2020 1417   NITRITE NEGATIVE 09/08/2020 2250   LEUKOCYTESUR NEGATIVE 09/08/2020 2250   Sepsis Labs Invalid input(s): PROCALCITONIN,  WBC,  LACTICIDVEN Microbiology Recent Results (from the past 240 hour(s))  Resp Panel by RT-PCR (Flu A&B, Covid) Nasopharyngeal Swab     Status: None   Collection Time: 09/07/20 11:10 PM   Specimen: Nasopharyngeal Swab; Nasopharyngeal(NP) swabs in vial transport medium  Result Value Ref Range Status   SARS Coronavirus 2 by RT PCR NEGATIVE NEGATIVE Final    Comment: (NOTE) SARS-CoV-2 target nucleic acids are NOT DETECTED.  The SARS-CoV-2 RNA is generally detectable in upper respiratory specimens during the acute phase of infection. The lowest concentration of SARS-CoV-2 viral copies this assay can detect is 138 copies/mL. A negative result does not preclude SARS-Cov-2 infection and should not be used as the sole basis for treatment or other patient management decisions. A negative result may occur with  improper specimen collection/handling, submission of specimen other than nasopharyngeal swab, presence of viral mutation(s) within the areas targeted by this assay, and inadequate number of viral copies(<138 copies/mL). A negative result must be combined with clinical observations, patient history, and  epidemiological information. The expected result is Negative.  Fact Sheet for Patients:  EntrepreneurPulse.com.au  Fact Sheet for Healthcare Providers:  IncredibleEmployment.be  This test is no t yet approved or cleared by the [place] FDA and  has been authorized for detection and/or diagnosis of SARS-CoV-2 by FDA under an Emergency Use Authorization (EUA). This EUA will remain  in effect (meaning this test can be used) for the duration of the COVID-19 declaration under Section 564(b)(1) of the Act, 21 U.S.C.section 360bbb-3(b)(1), unless the authorization is terminated  or revoked sooner.       Influenza A by PCR NEGATIVE NEGATIVE Final   Influenza B by PCR NEGATIVE NEGATIVE Final    Comment: (NOTE) The Xpert Xpress SARS-CoV-2/FLU/RSV plus assay is intended as an aid in the diagnosis of influenza from Nasopharyngeal swab specimens and should not be used as a sole basis for treatment. Nasal washings and aspirates are unacceptable for Xpert Xpress SARS-CoV-2/FLU/RSV testing.  Fact Sheet for Patients: EntrepreneurPulse.com.au  Fact Sheet for Healthcare Providers: IncredibleEmployment.be  This test is not yet approved or cleared by the [place] FDA and has been authorized for detection and/or diagnosis of SARS-CoV-2 by FDA under an Emergency Use Authorization (EUA). This EUA will remain in effect (meaning this test can be used) for the duration of the COVID-19 declaration under Section 564(b)(1) of the Act, 21 U.S.C. section 360bbb-3(b)(1), unless the authorization is terminated or revoked.  Performed at [facility], [address] [place], [place] [address]   SARS CORONAVIRUS 2 (TAT 6-24 HRS) Nasopharyngeal Nasopharyngeal Swab     Status: None   Collection Time: 09/09/20  9:13 AM   Specimen: Nasopharyngeal Swab  Result Value Ref Range Status   SARS Coronavirus 2  NEGATIVE NEGATIVE Final    Comment: (NOTE) SARS-CoV-2 target nucleic acids are NOT DETECTED.  The SARS-CoV-2 RNA is generally detectable in upper and lower respiratory specimens during the acute phase of infection. Negative results do not preclude SARS-CoV-2 infection, do not rule out co-infections with other pathogens, and should not be used as the sole basis for treatment or other patient management decisions. Negative results must be combined with clinical observations, patient history, and epidemiological information. The expected result is Negative.  Fact Sheet for Patients: SugarRoll.be  Fact Sheet for Healthcare Providers: https://www.woods-mathews.com/  This test is not yet approved or cleared by the [place] FDA and  has been authorized for detection and/or diagnosis of SARS-CoV-2 by FDA under an Emergency Use Authorization (EUA). This EUA will remain  in effect (meaning this test can be used) for the duration of the COVID-19 declaration under Se ction 564(b)(1) of the Act, 21 U.S.C. section 360bbb-3(b)(1), unless the authorization is terminated or revoked sooner.  Performed at [facility], [place] [address], [place], [place] [address]      Time coordinating discharge: Over 30 minutes  SIGNED:   Shawna Clamp, MD  [facility] 09/11/2020, 1:52 PM Pager   If 7PM-7AM, please contact night-coverage [email or web address]

## 2020-09-12 ENCOUNTER — Telehealth: Payer: Self-pay | Admitting: *Deleted

## 2020-09-12 NOTE — Telephone Encounter (Signed)
Transition Care Management Unsuccessful Follow-up Telephone Call  Date of discharge and from where:  [facility] 09-11-2020  Attempts:  1st Attempt  Reason for unsuccessful TCM follow-up call:  Unable to leave message voice mail full  Please schedule a follow up with PCP

## 2020-09-14 NOTE — Progress Notes (Signed)
Patient chart was reviewed prior to accepting patient to unit. Chart review showed RR to be unstable, this nurse made charge nurse aware. AC Joe was then made aware and rapid response assessed patient in the ED. Patient was then admitted to unit.  MEWS currently yellow on admit to unit, frequency of VS was increased at this time per protocol.    09/09/20 2355  Assess: MEWS Score  Temp 98.8 F (37.1 C)  BP 116/74  Pulse Rate (!) 108  Resp 12  SpO2 100 %  O2 Device Room Air  Assess: MEWS Score  MEWS Temp 0  MEWS Systolic 0  MEWS Pulse 1  MEWS RR 1  MEWS LOC 0  MEWS Score 2  MEWS Score Color Yellow  Assess: if the MEWS score is Yellow or Red  Were vital signs taken at a resting state? Yes  Focused Assessment Change from prior assessment (see assessment flowsheet)  Does the patient meet 2 or more of the SIRS criteria? No  MEWS guidelines implemented *See Row Information* Yes  Treat  MEWS Interventions Administered scheduled meds/treatments  Pain Score Asleep  Take Vital Signs  Increase Vital Sign Frequency  Yellow: Q 2hr X 2 then Q 4hr X 2, if remains yellow, continue Q 4hrs  Escalate  MEWS: Escalate Yellow: discuss with charge nurse/RN and consider discussing with provider and RRT  Notify: Charge Nurse/RN  Name of Charge Nurse/RN Notified Vera, RN  Date Charge Nurse/RN Notified 09/09/20  Time Charge Nurse/RN Notified 2359  Notify: Rapid Response  Name of Rapid Response RN Notified Erin RN (rapid response nurse sent to evaluate patient prior to arrival to unit to ensure patient was stable)  Date Rapid Response Notified 09/09/20  Time Rapid Response Notified 2340  Document  Progress note created (see row info) Yes  Assess: SIRS CRITERIA  SIRS Temperature  0  SIRS Pulse 1  SIRS Respirations  0  SIRS WBC 0  SIRS Score Sum  1

## 2020-09-24 ENCOUNTER — Ambulatory Visit (INDEPENDENT_AMBULATORY_CARE_PROVIDER_SITE_OTHER): Payer: 59 | Admitting: Family Medicine

## 2020-09-24 ENCOUNTER — Encounter: Payer: 59 | Attending: Family Medicine | Admitting: Nutrition

## 2020-09-24 ENCOUNTER — Other Ambulatory Visit: Payer: Self-pay

## 2020-09-24 VITALS — BP 112/78 | HR 123 | Temp 98.3°F | Wt 169.2 lb

## 2020-09-24 DIAGNOSIS — E10319 Type 1 diabetes mellitus with unspecified diabetic retinopathy without macular edema: Secondary | ICD-10-CM | POA: Insufficient documentation

## 2020-09-24 DIAGNOSIS — E1043 Type 1 diabetes mellitus with diabetic autonomic (poly)neuropathy: Secondary | ICD-10-CM

## 2020-09-24 DIAGNOSIS — K3184 Gastroparesis: Secondary | ICD-10-CM | POA: Diagnosis not present

## 2020-09-24 DIAGNOSIS — E1165 Type 2 diabetes mellitus with hyperglycemia: Secondary | ICD-10-CM | POA: Insufficient documentation

## 2020-09-24 DIAGNOSIS — E11319 Type 2 diabetes mellitus with unspecified diabetic retinopathy without macular edema: Secondary | ICD-10-CM

## 2020-09-24 MED ORDER — DEXCOM G6 RECEIVER DEVI: 1.0000 | Freq: Every day | 0 refills | Status: DC

## 2020-09-24 MED ORDER — DEXCOM G6 TRANSMITTER MISC: 1.0000 | Freq: Every day | 3 refills | Status: DC

## 2020-09-24 MED ORDER — CALCIUM GLUCONATE 500 MG PO TABS: 1.0000 | ORAL_TABLET | Freq: Three times a day (TID) | ORAL | 3 refills | Status: DC

## 2020-09-24 MED ORDER — DEXCOM G6 SENSOR MISC: 1.0000 | Freq: Every day | 12 refills | Status: DC

## 2020-09-24 NOTE — Progress Notes (Signed)
[facility] [address] [place] [address] Dept: [phone] Dept Fax: [phone]  Office Visit  Subjective:    Patient ID: Laurie Panda, female    DOB: 06-Dec-1990, 30 y.o..   MRN: [id number]  Chief Complaint  Patient presents with   Follow-up    F/u ongoing vision changes and dizziness.    History of Present Illness:  Patient is in today for hospital follow-up. Ms. 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 She currently is eating okay. She did have the change to meet with a dietician. Her mother notes that she was found to be able to eat whatever she can. There was concern for her calcium intake and for protein. Kateryn asks about a calcium supplement. They also requested a prescription for Boost.  Ms. Sorn notes her blood sugars are running at times in the 150-250 range. She is currently on Lantus 18 units daily and a sliding scale with Novolin, taking about 30 units a day on average. Ms. Pytel met with a diabetes nurse educator Vaughan Basta Spagnola, RN, [id number] [phone] has recommended she obtain a Dexcom G6 CGM system. She will then work with [employer] on set up and monitoring.  Ms. Chesney has learned she has developed diabetic retinopathy. Chantrell has had some decrease in his vision related to this. She has an appointment this week for laser treatment for this. She and her mother request referral to Algis Greenhouse, at [facility].  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 pylori infection 10/07/2019   Depression with anxiety 07/05/2019   Diabetic foot ulcer ([place]) 02/21/2018   Diabetic polyneuropathy associated with type 2 diabetes mellitus ([place]) 09/24/2017   Pain in right foot 09/24/2017   Gastroparesis    Leukocytosis    Increased frequency of urination 06/14/2011   UTI (urinary tract infection) 06/14/2011   Goiter 10/05/2009   Uncontrolled diabetes mellitus ([place]) 10/05/2009   Essential hypertension 10/05/2009   Past Surgical History:  Procedure Laterality Date   AMPUTATION TOE Left 03/10/2018   Procedure: AMPUTATION FIFTH TOE;  Surgeon: Trula Slade, DPM;  Location: [facility];  Service: Podiatry;  Laterality: Left;   BIOPSY  01/28/2020   Procedure: BIOPSY;  Surgeon: Otis Brace, MD;  Location: [facility];  Service: Gastroenterology;;   ESOPHAGOGASTRODUODENOSCOPY N/A 01/28/2020   Procedure: ESOPHAGOGASTRODUODENOSCOPY (EGD);  Surgeon: Otis Brace, MD;  Location: [facility];  Service: Gastroenterology;  Laterality: N/A;   ESOPHAGOGASTRODUODENOSCOPY (EGD) WITH PROPOFOL Left 09/08/2015   Procedure: ESOPHAGOGASTRODUODENOSCOPY (EGD) WITH PROPOFOL;  Surgeon: Arta Silence, MD;  Location[facility];  Service: Endoscopy;  Laterality: Left;   WISDOM TOOTH EXTRACTION      Family History  Problem  Relation Age of Onset   Lung cancer Mother    Diabetes Mother    Bipolar disorder Father    Liver cancer Maternal Grandfather    Breast cancer Other        maternal  great aunt   Heart disease Other    Ovarian cancer Other        maternal great aunt   Pancreatic cancer Maternal Uncle    Prostate cancer Maternal Uncle    Diabetes Maternal Aunt        x 2   Kidney disease Other        maternal great aunt   Outpatient Medications Prior to Visit  Medication Sig Dispense Refill   atenolol (TENORMIN) 25 MG tablet Take 1 tablet (25 mg total) by mouth 2 (two) times daily. 60 tablet 2   Blood Glucose Monitoring Suppl (TRUE METRIX METER) w/Device KIT 1 Device by Does not apply route 4 (four) times daily. 1 kit 0   erythromycin (E-MYCIN) 250 MG tablet Take 1 tablet (250 mg total) by mouth 2 (two) times daily. 60 tablet 0   famotidine (PEPCID) 20 MG tablet Take 20 mg by mouth 2 (two) times daily.     glucose blood (TRUE METRIX BLOOD GLUCOSE TEST) test strip Use as instructed 100 each 12   hyoscyamine (LEVBID) 0.375 MG 12 hr tablet Take 1 tablet (0.375 mg total) by mouth every 12 (twelve) hours as needed for cramping (abdominal pain). 60 tablet 0   insulin glargine (LANTUS) 100 UNIT/ML injection Inject 0.18 mLs (18 Units total) into the skin daily. 10 mL 0   Insulin Pen Needle 31G X 5 MM MISC 1 Device by Does not apply route QID. For use with insulin pens 100 each 0   insulin regular (NOVOLIN R) 100 units/mL injection Inject 0-0.15 mLs (0-15 Units total) into the skin 3 (three) times daily before meals. CBG 70 - 120: 0 units CBG 121 - 150: 2 units CBG 151 - 200: 3 units CBG 201 - 250: 5 units CBG 251 - 300: 8 units CBG 301 - 350: 11 units CBG 351 - 400: 15 units 10 mL 0   levalbuterol (XOPENEX HFA) 45 MCG/ACT inhaler Inhale 2 puffs into the lungs every 6 (six) hours as needed for wheezing or shortness of breath. 1 each 0   lisinopril (ZESTRIL) 10 MG tablet Take 1 tablet (10 mg total) by mouth daily. 30 tablet 1   metoCLOPramide (REGLAN) 10 MG tablet Take 1 tablet (10 mg total) by mouth 3 (three) times daily before meals. 90 tablet 0   Multiple Vitamin  (MULTIVITAMIN WITH MINERALS) TABS tablet Take 1 tablet by mouth daily.     ondansetron (ZOFRAN ODT) 4 MG disintegrating tablet Take 1 tablet (4 mg total) by mouth every 6 (six) hours as needed for nausea or vomiting. 40 tablet 6   pantoprazole (PROTONIX) 40 MG tablet Take 40 mg by mouth daily.     promethazine (PHENERGAN) 12.5 MG tablet Take 1 tablet (12.5 mg total) by mouth every 6 (six) hours as needed for nausea or vomiting. 30 tablet 0   TRUEPLUS LANCETS 28G MISC assist with checking blood sugar TID and qhs 100 each 3   Insulin Pen Needle 31G X 5 MM MISC Lantus 18 units daily 100 each 0   No facility-administered medications prior to visit.   No Known Allergies    Objective:   Today's Vitals   09/24/20 1503  BP: 112/78  Pulse: (!) 123  Temp: 98.3 F (36.8 C)  TempSrc: Oral  SpO2: 98%  Weight: 169 lb 3.2 oz (76.7 kg)   Body mass index is 25.73 kg/m.   General: Well developed, well nourished. No acute distress. HEENT: Fundiscopic exam shows some neovascularization and venous beading with some hemorrhages.[name] Psych: Alert and oriented. Normal mood and affect.  Health Maintenance Due  Topic Date Due   OPHTHALMOLOGY EXAM  Never done   PAP SMEAR-Modifier  Never done   COVID-19 Vaccine (3 - Booster for Pfizer series) 05/27/2020   INFLUENZA VACCINE  09/17/2020     Assessment & Plan:   1. Uncontrolled type 2 diabetes mellitus with hyperglycemia (HCC) Will check CBC and CMP as requested by discharging physician. I will order components for Dexcom G6 CGM system.Recommend we reassess her diabetes in 6 weeks.  - CBC - Comprehensive metabolic panel - Continuous Blood Gluc Receiver (DEXCOM G6 RECEIVER) DEVI; 1 Device by Does not apply route daily.  Dispense: 1 each; Refill: 0 - Continuous Blood Gluc Transmit (DEXCOM G6 TRANSMITTER) MISC; 1 each by Does not apply route daily.  Dispense: 1 each; Refill: 3 - Continuous Blood Gluc Sensor (DEXCOM G6 SENSOR) MISC; 1 each by Does not apply  route daily.  Dispense: 3 each; Refill: 12  2. Diabetic retinopathy of both eyes associated with type 2 diabetes mellitus, macular edema presence unspecified, unspecified retinopathy severity ([place]) Strongly encourage Matsuko to follow-up with ophthalmologist for laser treatment. I placed the referral for Dr. Heide Spark, but it is unclear what her role will be., as she seems to focus more on cosmetic procedures.  - Ambulatory referral to Ophthalmology  3. Gastroparesis I reviewed discharge summaries for recent hospitalizations. Boost is not covered by Ms. Johnson-Alston's current insurance. I explained to [place] and her mother that this would be considered an aspect of her nutrition, like food, which insurances do not typically pay for. I will prescribe a calcium supplement.  - calcium gluconate 500 MG tablet; Take 1 tablet (500 mg total) by mouth 3 (three) times daily.  Dispense: 270 tablet; Refill: 3  [name], MD

## 2020-09-25 LAB — COMPREHENSIVE METABOLIC PANEL
ALT: 22 U/L (ref 0–35)
AST: 20 U/L (ref 0–37)
Albumin: 3.7 g/dL (ref 3.5–5.2)
Alkaline Phosphatase: 79 U/L (ref 39–117)
BUN: 26 mg/dL — ABNORMAL HIGH (ref 6–23)
CO2: 27 mEq/L (ref 19–32)
Calcium: 9.3 mg/dL (ref 8.4–10.5)
Chloride: 101 mEq/L (ref 96–112)
Creatinine, Ser: 1.05 mg/dL (ref 0.40–1.20)
GFR: 71.9 mL/min (ref >60.00)
Glucose, Bld: 237 mg/dL — ABNORMAL HIGH (ref 70–99)
Potassium: 4.4 mEq/L (ref 3.5–5.1)
Sodium: 136 mEq/L (ref 135–145)
Total Bilirubin: 0.5 mg/dL (ref 0.2–1.2)
Total Protein: 6.5 g/dL (ref 6.0–8.3)

## 2020-09-25 LAB — CBC
HCT: 29.9 % — ABNORMAL LOW (ref 36.0–46.0)
Hemoglobin: 10.1 g/dL — ABNORMAL LOW (ref 12.0–15.0)
MCHC: 33.8 g/dL (ref 30.0–36.0)
MCV: 88.7 fl (ref 78.0–100.0)
Platelets: 344 10*3/uL (ref 150.0–400.0)
RBC: 3.37 Mil/uL — ABNORMAL LOW (ref 3.87–5.11)
RDW: 13.5 % (ref 11.5–15.5)
WBC: 5.1 10*3/uL (ref 4.0–10.5)

## 2020-09-25 NOTE — Progress Notes (Signed)
Patient is here with her caregiver to review her diet.  She has type I diabetes with gastroparesis.  She says that her gastroparesis is worse when she eats acidic foods and that this is what precipitated her emergency visit with nausea and vomiting. We discussed what gastroparesis is for her caregiver, and what are the things that aggravat this condition: high blood sugars, high fat meals and certain foods that are specific for her, like acidic foods.  Diet appears balanced, but limited in quantity and she takes insulin per her personal history, not related to the amount of carbs eaten, afraid she will not be able to eat all that is needed for insulin given.  We discussed acidic foods and need for protein to prevent low blood sugars.   Suggested use of a CGM that may help patient to view readings after meals and directions blood sugars are traveling as well as alerts 30 minutes before dropping low.  She agreed to try this.  She was given a sample to try, but did not have the phone capability to allow the readings to go to her phone.  She will need a reader for this.  Dexcom  CGM was suggested, because she may later want to go back to pump therapy, to help with bolus calculations, extended meal boluses to be used for gastroparesis, and being able to stop basal insulin when vomiting.  We discussed this and she was shown the different pumps that use the Dexcom to regulate low and high readings.   She was shown the OmniPod 5 and the Tandem pumps.  She was given brochures to review and told to go to the web sites for more information.  She seemed very interested in both the CGM and the Insulin pump options, but was told that they both need prescriptions from her MD.  She reported that seh will be going there today and will get the script for the Dexcom.  She was told to return here for training on this.  She was given my number to call to schedule a return visit for this.

## 2020-09-26 ENCOUNTER — Ambulatory Visit: Payer: 59 | Admitting: Physician Assistant

## 2020-09-27 MED ORDER — LISINOPRIL 10 MG PO TABS: 10.0000 mg | ORAL_TABLET | Freq: Every day | ORAL | 3 refills | Status: DC

## 2020-09-27 NOTE — Addendum Note (Signed)
Addended by: Loyola Mast on: 09/27/2020 03:33 PM   Modules accepted: Orders

## 2020-09-29 NOTE — Patient Instructions (Signed)
Discuss possible use of CGM with MD and get a prescription for Dexcom CGM and Reader Please go to Tandem and OmniPod pump web sites to review information on the new insulin pumps.   Call if questions

## 2020-10-03 ENCOUNTER — Ambulatory Visit: Payer: 59

## 2020-10-03 DIAGNOSIS — K3184 Gastroparesis: Secondary | ICD-10-CM

## 2020-10-03 DIAGNOSIS — E1165 Type 2 diabetes mellitus with hyperglycemia: Secondary | ICD-10-CM

## 2020-10-03 NOTE — Chronic Care Management (AMB) (Deleted)
Chronic Care Management   CCM RN Visit Note  10/03/2020 Name: Kelli Hudson MRN: [id number] DOB: Feb 09, 1991  Subjective: Kelli Hudson is a 30 y.o. year old female who is a primary care patient of Rudd, Lillette Boxer, MD. The care management team was consulted for assistance with disease management and care coordination needs.    Engaged with patient by telephone for follow up visit in response to provider referral for case management and/or care coordination services.   Consent to Services:  The patient was given information about Chronic Care Management services, agreed to services, and gave verbal consent prior to initiation of services.  Please see initial visit note for detailed documentation.   Patient agreed to services and verbal consent obtained.   Assessment: Review of patient past medical history, allergies, medications, health status, including review of consultants reports, laboratory and other test data, was performed as part of comprehensive evaluation and provision of chronic care management services.   SDOH (Social Determinants of Health) assessments and interventions performed:    CCM Care Plan  No Known Allergies  Outpatient Encounter Medications as of 10/03/2020  Medication Sig Note   atenolol (TENORMIN) 25 MG tablet Take 1 tablet (25 mg total) by mouth 2 (two) times daily.    erythromycin (E-MYCIN) 250 MG tablet Take 1 tablet (250 mg total) by mouth 2 (two) times daily.    famotidine (PEPCID) 20 MG tablet Take 20 mg by mouth 2 (two) times daily.    hyoscyamine (LEVBID) 0.375 MG 12 hr tablet Take 1 tablet (0.375 mg total) by mouth every 12 (twelve) hours as needed for cramping (abdominal pain).    insulin glargine (LANTUS) 100 UNIT/ML injection Inject 0.18 mLs (18 Units total) into the skin daily.    insulin regular (NOVOLIN R) 100 units/mL injection Inject 0-0.15 mLs (0-15 Units total) into the skin 3 (three) times daily before meals. CBG 70 - 120: 0  units CBG 121 - 150: 2 units CBG 151 - 200: 3 units CBG 201 - 250: 5 units CBG 251 - 300: 8 units CBG 301 - 350: 11 units CBG 351 - 400: 15 units 09/08/2020: 09/07/20, 10 units    levalbuterol (XOPENEX HFA) 45 MCG/ACT inhaler Inhale 2 puffs into the lungs every 6 (six) hours as needed for wheezing or shortness of breath.    lisinopril (ZESTRIL) 10 MG tablet Take 1 tablet (10 mg total) by mouth daily.    metoCLOPramide (REGLAN) 10 MG tablet Take 1 tablet (10 mg total) by mouth 3 (three) times daily before meals.    Multiple Vitamin (MULTIVITAMIN WITH MINERALS) TABS tablet Take 1 tablet by mouth daily.    ondansetron (ZOFRAN ODT) 4 MG disintegrating tablet Take 1 tablet (4 mg total) by mouth every 6 (six) hours as needed for nausea or vomiting.    pantoprazole (PROTONIX) 40 MG tablet Take 40 mg by mouth daily.    promethazine (PHENERGAN) 12.5 MG tablet Take 1 tablet (12.5 mg total) by mouth every 6 (six) hours as needed for nausea or vomiting.    Blood Glucose Monitoring Suppl (TRUE METRIX METER) w/Device KIT 1 Device by Does not apply route 4 (four) times daily.    calcium gluconate 500 MG tablet Take 1 tablet (500 mg total) by mouth 3 (three) times daily. (Patient not taking: Reported on 10/03/2020)    Continuous Blood Gluc Receiver (DEXCOM G6 RECEIVER) DEVI 1 Device by Does not apply route daily.    Continuous Blood Gluc Sensor (DEXCOM G6 SENSOR) MISC 1  each by Does not apply route daily.    Continuous Blood Gluc Transmit (DEXCOM G6 TRANSMITTER) MISC 1 each by Does not apply route daily.    glucose blood (TRUE METRIX BLOOD GLUCOSE TEST) test strip Use as instructed    Insulin Pen Needle 31G X 5 MM MISC 1 Device by Does not apply route QID. For use with insulin pens    TRUEPLUS LANCETS 28G MISC assist with checking blood sugar TID and qhs    [DISCONTINUED] insulin aspart (NOVOLOG) 100 UNIT/ML FlexPen Inject 5 Units into the skin 3 (three) times daily with meals.    No facility-administered  encounter medications on file as of 10/03/2020.    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 ([place]) 09/24/2020   Diabetic gastroparesis associated with type 1 diabetes mellitus ([place]) 08/12/2020   AKI (acute kidney injury) ([place]) 07/25/2020   GERD (gastroesophageal reflux disease) 07/23/2020   Diabetic nephropathy ([place]) 06/27/2020   Borderline hyperlipidemia 05/23/2020   Gastroparesis due to DM ([place]) 05/11/2020   Uncontrolled type 2 diabetes mellitus with gastroparesis ([place]) 05/09/2020   Normocytic anemia 04/20/2020   Hyperosmolar hyperglycemic state (HHS) ([place]) 02/24/2020   Intractable nausea and vomiting    Intractable abdominal pain 01/24/2020   H. pylori infection 10/07/2019   Depression with anxiety 07/05/2019   Diabetic foot ulcer ([place]) 02/21/2018   Diabetic polyneuropathy associated with type 2 diabetes mellitus ([place]) 09/24/2017   UTI (urinary tract infection) 06/14/2011   Goiter 10/05/2009   Uncontrolled diabetes mellitus ([place]) 10/05/2009   Essential hypertension 10/05/2009    Conditions to be addressed/monitored:DMII and Gastroparesis  Care Plan : Diabetes Type 2 (Adult)  Updates made by Dannielle Karvonen, RN since 10/03/2020 12:00 AM     Problem: Knowledge deficit related to long term self health management of diabetes with gastroparesis.   Priority: High     Long-Range Goal: Disease progression minimized   Start Date: 06/13/2020  Expected End Date: 01/16/2021  This Visit's Progress: On track  Recent Progress: On track  Priority: High  Note:   Objective:  Lab Results  Component Value Date   HGBA1C 10.5 (H) 09/09/2020   Lab Results  Component Value Date   CREATININE 1.05 09/24/2020   CREATININE 0.84 09/11/2020   CREATININE 0.96 09/09/2020  Current Barriers:  Knowledge Deficits related to basic Diabetes pathophysiology and self care/management:  Patient states she had her post  hospital follow up her primary care provider on 09/24/2020.  No change in treatment plan.  She reports having her initial visit with the nutritionist and is scheduled for a follow up on 10/08/2020.  Patient states the nutritionist will help her to set up her new Dexcom this visit and will discuss insulin pump option further.  Patient states she was advised to eat more protein and calcium.  She states her insurance does not cover nutritional supplements so she purchased boost on her own which is quite expensive.  Patient states she is feeling good today. She states her fasting blood sugar today was 172.  Patient states she attempted to get her calcium tablets refilled but was unable to. She states she is currently out of them.  Patient states she had a conflict in her schedule and did not get to keep her endocrinologist visit.  She does not have a reschedule date.     Case Manager Clinical Goal(s):  patient will demonstrate improved adherence to prescribed treatment  plan for diabetes self care/management as evidenced by: daily monitoring and recording of CBG  adherence to ADA/ carb modified diet adherence to prescribed medication regimen contacting provider for new or worsened symptoms or questions Patient will schedule and attend appointment with endocrinologist Patient will report scheduling appointment with nutritionist Interventions:  Collaboration with Gena Fray Lillette Boxer, MD regarding development and update of comprehensive plan of care as evidenced by provider attestation and co-signature:   Inter-disciplinary care team collaboration (see longitudinal plan of care): RNCM will follow up on nutritional supplement assistance. RNCM advised patient to contact her primary care provider office regarding her calcium prescription and advised patient to call the endocrinology office to reschedule appointment.  Reviewed medications with patient and discussed importance of medication adherence Discussed plans with  patient for ongoing care management follow up and provided patient with direct contact information for care management team Provided patient with written educational materials related to hypo and hyperglycemia and importance of correct treatment Reviewed scheduled/upcoming provider appointments including:  Provided patient with written education material related to gastroparesis. Referral made to pharmacy team for medication assistance with Novolog Self-Care Activities - Self administers insulin as prescribed Attends all scheduled provider appointments Checks blood sugars as prescribed and utilize hyper and hypoglycemia protocol as needed Adheres to prescribed ADA/carb modified Patient Goals: -  Continue to check blood sugar at prescribed times and take your blood sugar log/ diary to your appointments.   - Continue to check blood sugar if I feel it is too high or too low -  Stay hydrated: drink 6 to 8 glasses of water each day - eat small, frequent meals,  Avoid raw and uncooked fruits and vegetables, fibrous fruits and vegetables (  - gentle exercise following meals, such as walking - Continue to follow up with your doctors as recommended.  - Reschedule follow up visit with your endocrinologist. (If you need assistance please contact your RN case manager [phone] to assist you) - Call your primary care provider office regarding your calcium prescription.  Follow Up Plan: The patient has been provided with contact information for the care management team and has been advised to call with any health related questions or concerns.  The care management team will reach out to the patient again over the next 45 days.           Plan:The patient has been provided with contact information for the care management team and has been advised to call with any health related questions or concerns.  and The care management team will reach out to the patient again over the next 45 days. Quinn Plowman  RN,BSN,CCM RN Case Manager [place]  [phone]

## 2020-10-03 NOTE — Chronic Care Management (AMB) (Signed)
Care Management    RN Visit Note  10/03/2020 Name: Kelli Hudson MRN: [id number] DOB: Jun 06, 1990  Subjective: Kelli Hudson is a 30 y.o. year old female who is a primary care patient of Rudd, Lillette Boxer, MD. The care management team was consulted for assistance with disease management and care coordination needs.    Engaged with patient by telephone for follow up visit in response to provider referral for case management and/or care coordination services.   Consent to Services:   Kelli Hudson was given information about Care Management services today including:  Care Management services includes personalized support from designated clinical staff supervised by her physician, including individualized plan of care and coordination with other care providers 24/7 contact phone numbers for assistance for urgent and routine care needs. The patient may stop case management services at any time by phone call to the office staff.  Patient agreed to services and consent obtained.   Assessment: Review of patient past medical history, allergies, medications, health status, including review of consultants reports, laboratory and other test data, was performed as part of comprehensive evaluation and provision of chronic care management services.   SDOH (Social Determinants of Health) assessments and interventions performed:    Care Plan  No Known Allergies  Outpatient Encounter Medications as of 10/03/2020  Medication Sig Note   atenolol (TENORMIN) 25 MG tablet Take 1 tablet (25 mg total) by mouth 2 (two) times daily.    erythromycin (E-MYCIN) 250 MG tablet Take 1 tablet (250 mg total) by mouth 2 (two) times daily.    famotidine (PEPCID) 20 MG tablet Take 20 mg by mouth 2 (two) times daily.    hyoscyamine (LEVBID) 0.375 MG 12 hr tablet Take 1 tablet (0.375 mg total) by mouth every 12 (twelve) hours as needed for cramping (abdominal pain).    insulin glargine (LANTUS) 100  UNIT/ML injection Inject 0.18 mLs (18 Units total) into the skin daily.    insulin regular (NOVOLIN R) 100 units/mL injection Inject 0-0.15 mLs (0-15 Units total) into the skin 3 (three) times daily before meals. CBG 70 - 120: 0 units CBG 121 - 150: 2 units CBG 151 - 200: 3 units CBG 201 - 250: 5 units CBG 251 - 300: 8 units CBG 301 - 350: 11 units CBG 351 - 400: 15 units 09/08/2020: 09/07/20, 10 units    levalbuterol (XOPENEX HFA) 45 MCG/ACT inhaler Inhale 2 puffs into the lungs every 6 (six) hours as needed for wheezing or shortness of breath.    lisinopril (ZESTRIL) 10 MG tablet Take 1 tablet (10 mg total) by mouth daily.    metoCLOPramide (REGLAN) 10 MG tablet Take 1 tablet (10 mg total) by mouth 3 (three) times daily before meals.    Multiple Vitamin (MULTIVITAMIN WITH MINERALS) TABS tablet Take 1 tablet by mouth daily.    ondansetron (ZOFRAN ODT) 4 MG disintegrating tablet Take 1 tablet (4 mg total) by mouth every 6 (six) hours as needed for nausea or vomiting.    pantoprazole (PROTONIX) 40 MG tablet Take 40 mg by mouth daily.    promethazine (PHENERGAN) 12.5 MG tablet Take 1 tablet (12.5 mg total) by mouth every 6 (six) hours as needed for nausea or vomiting.    Blood Glucose Monitoring Suppl (TRUE METRIX METER) w/Device KIT 1 Device by Does not apply route 4 (four) times daily.    calcium gluconate 500 MG tablet Take 1 tablet (500 mg total) by mouth 3 (three) times daily. (Patient not taking: Reported  on 10/03/2020)    Continuous Blood Gluc Receiver (DEXCOM G6 RECEIVER) DEVI 1 Device by Does not apply route daily.    Continuous Blood Gluc Sensor (DEXCOM G6 SENSOR) MISC 1 each by Does not apply route daily.    Continuous Blood Gluc Transmit (DEXCOM G6 TRANSMITTER) MISC 1 each by Does not apply route daily.    glucose blood (TRUE METRIX BLOOD GLUCOSE TEST) test strip Use as instructed    Insulin Pen Needle 31G X 5 MM MISC 1 Device by Does not apply route QID. For use with insulin pens     TRUEPLUS LANCETS 28G MISC assist with checking blood sugar TID and qhs    [DISCONTINUED] insulin aspart (NOVOLOG) 100 UNIT/ML FlexPen Inject 5 Units into the skin 3 (three) times daily with meals.    No facility-administered encounter medications on file as of 10/03/2020.    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 pylori infection 10/07/2019   Depression with anxiety 07/05/2019   Diabetic foot ulcer ([place]) 02/21/2018   Diabetic polyneuropathy associated with type 2 diabetes mellitus ([place]) 09/24/2017   UTI (urinary tract infection) 06/14/2011   Goiter 10/05/2009   Uncontrolled diabetes mellitus ([place]) 10/05/2009   Essential hypertension 10/05/2009    Conditions to be addressed/monitored: DMII and Gastroparesis  Care Plan : Diabetes Type 2 (Adult)  Updates made by Dannielle Karvonen, RN since 10/03/2020 12:00 AM     Problem: Knowledge deficit related to long term self health management of diabetes with gastroparesis.   Priority: High     Long-Range Goal: Disease progression minimized   Start Date: 06/13/2020  Expected End Date: 01/16/2021  This Visit's Progress: On track  Recent Progress: On track  Priority: High  Note:   Objective:  Lab Results  Component Value Date   HGBA1C 10.5 (H) 09/09/2020   Lab Results  Component Value Date    CREATININE 1.05 09/24/2020   CREATININE 0.84 09/11/2020   CREATININE 0.96 09/09/2020  Current Barriers:  Knowledge Deficits related to basic Diabetes pathophysiology and self care/management:  Patient states she had her post hospital follow up her primary care provider on 09/24/2020.  No change in treatment plan.  She reports having her initial visit with the nutritionist and is scheduled for a follow up on 10/08/2020.  Patient states the nutritionist will help her to set up her new Dexcom this visit and will discuss insulin pump option further.  Patient states she was advised to eat more protein and calcium.  She states her insurance does not cover nutritional supplements so she purchased boost on her own which is quite expensive.  Patient states she is feeling good today. She states her fasting blood sugar today was 172.  Patient states she attempted to get her calcium tablets refilled but was unable to. She states she is currently out of them.  Patient states she had a conflict in her schedule  and did not get to keep her endocrinologist visit.  She does not have a reschedule date.     Case Manager Clinical Goal(s):  patient will demonstrate improved adherence to prescribed treatment plan for diabetes self care/management as evidenced by: daily monitoring and recording of CBG  adherence to ADA/ carb modified diet adherence to prescribed medication regimen contacting provider for new or worsened symptoms or questions Patient will schedule and attend appointment with endocrinologist Patient will report scheduling appointment with nutritionist Interventions:  Collaboration with Gena Fray Lillette Boxer, MD regarding development and update of comprehensive plan of care as evidenced by provider attestation and co-signature:   Inter-disciplinary care team collaboration (see longitudinal plan of care): RNCM will follow up on nutritional supplement assistance. RNCM advised patient to contact her primary care provider  office regarding her calcium prescription and advised patient to call the endocrinology office to reschedule appointment.  Reviewed medications with patient and discussed importance of medication adherence Discussed plans with patient for ongoing care management follow up and provided patient with direct contact information for care management team Provided patient with written educational materials related to hypo and hyperglycemia and importance of correct treatment Reviewed scheduled/upcoming provider appointments including:  Provided patient with written education material related to gastroparesis. Referral made to pharmacy team for medication assistance with Novolog Self-Care Activities - Self administers insulin as prescribed Attends all scheduled provider appointments Checks blood sugars as prescribed and utilize hyper and hypoglycemia protocol as needed Adheres to prescribed ADA/carb modified Patient Goals: -  Continue to check blood sugar at prescribed times and take your blood sugar log/ diary to your appointments.   - Continue to check blood sugar if I feel it is too high or too low -  Stay hydrated: drink 6 to 8 glasses of water each day - eat small, frequent meals,  Avoid raw and uncooked fruits and vegetables, fibrous fruits and vegetables (  - gentle exercise following meals, such as walking - Continue to follow up with your doctors as recommended.  - Reschedule follow up visit with your endocrinologist. (If you need assistance please contact your RN case manager [phone] to assist you) - Call your primary care provider office regarding your calcium prescription.  Follow Up Plan: The patient has been provided with contact information for the care management team and has been advised to call with any health related questions or concerns.  The care management team will reach out to the patient again over the next 45 days.           Plan: The patient has been provided  with contact information for the care management team and has been advised to call with any health related questions or concerns.  and The care management team will reach out to the patient again over the next 45 days.  Quinn Plowman RN,BSN,CCM RN Case Manager [place] [phone]

## 2020-10-03 NOTE — Patient Instructions (Signed)
Visit Information: Thank you for taking the time speak with me today.    Goals Addressed             This Visit's Progress    Monitor and manage blood sugars and gastroparesis symptoms   On track    Timeframe:  Long-Range Goal Priority:  High Start Date:    06/13/2020                        Expected End Date:  01/16/2021                 Follow Up Date 11/07/2020  -  Continue to check blood sugar at prescribed times and take your blood sugar log/ diary to your appointments.   - Continue to check blood sugar if I feel it is too high or too low -  Stay hydrated: drink 6 to 8 glasses of water each day - eat small, frequent meals,  Avoid raw and uncooked fruits and vegetables, fibrous fruits and vegetables (  - gentle exercise following meals, such as walking - Continue to follow up with your doctors as recommended.  - Reschedule follow up visit with your endocrinologist. (If you need assistance please contact your RN case manager [phone] to assist you) - Call your primary care provider office regarding your calcium prescription.      Why is this important?   Checking your blood sugar at home helps to keep it from getting very high or very low.  Writing the results in a diary or log helps the doctor know how to care for you.  Your blood sugar log should have the time, date and the results.  Also, write down the amount of insulin or other medicine that you take.  Other information, like what you ate, exercise done and how you were feeling, will also be helpful.             Patient verbalizes understanding of instructions provided today and agrees to view in MyChart.   The patient has been provided with contact information for the care management team and has been advised to call with any health related questions or concerns.  The care management team will reach out to the patient again over the next 45 days.   George Ina RN,BSN,CCM RN Case Manager [place] [employer]   [phone]

## 2020-10-08 ENCOUNTER — Encounter: Payer: 59 | Admitting: Nutrition

## 2020-10-08 ENCOUNTER — Other Ambulatory Visit: Payer: Self-pay

## 2020-10-08 DIAGNOSIS — E1165 Type 2 diabetes mellitus with hyperglycemia: Secondary | ICD-10-CM | POA: Diagnosis not present

## 2020-10-08 DIAGNOSIS — E1065 Type 1 diabetes mellitus with hyperglycemia: Secondary | ICD-10-CM

## 2020-10-09 NOTE — Patient Instructions (Signed)
Change Dexcom sensor every 10 days Change Dexcom transmitter every 3 months Read over Dexcom manual and call Dexcom if questions. Call me for training on the new pump when it arrives

## 2020-10-09 NOTE — Progress Notes (Signed)
Patient is here today with her caregiver to learn how to use the Dexcom G6 CGM.  We discussed the difference between sensor readings and blood sugar readings, and the need to do finger sticks when in doubt of sensor readings,and during low blood sugars.  She reported good understanding of this.  A sensor was started in her left arm with readings going to her phone. She was shown how to share these readings with others if she wants to do this. She also wanted to discussed insulin pump therapy.  She was shown the 2 pumps that would link to her Dexcom:  Tandem, and OmniPod 5.  We discussed how these work, and what is required, to use these pumps.  She reports knowing how to count carbs, and we reviewed this.  It appears she understands this, but I advised her to download the Calorie Brooke Dare app for unknown meals like when she does eat out at fast food restaurants.  She agreed to do this.   She decided that she wants the OmmiPod 5 pump.  Both she and her caregiver agreed that this one would be the easiest to use and would assist her in getting her to blood sugar goal.  We also discussed the need for constant monitoring of blood sugar readings, since this pump runs only on fast acting insulin.  She reported understanding of this and had no final questions.  She wished for me to send a message to her MD that she wants this brand of pump.

## 2020-10-17 ENCOUNTER — Encounter: Payer: Self-pay | Admitting: Family Medicine

## 2020-10-17 DIAGNOSIS — K3184 Gastroparesis: Secondary | ICD-10-CM

## 2020-10-17 MED ORDER — METOCLOPRAMIDE HCL 10 MG PO TABS: 10.0000 mg | ORAL_TABLET | Freq: Three times a day (TID) | ORAL | 3 refills | Status: DC

## 2020-10-19 ENCOUNTER — Encounter: Payer: Self-pay | Admitting: Family Medicine

## 2020-10-30 ENCOUNTER — Telehealth: Payer: Self-pay | Admitting: Nutrition

## 2020-10-30 NOTE — Telephone Encounter (Signed)
Patient was called and told that she has not been seen at this practice in over 2 years and Dr. Everardo All can not write orders for the pump start until she has been seen. Telephone number given to call to make the appointment.

## 2020-10-31 ENCOUNTER — Encounter: Payer: Self-pay | Admitting: Family Medicine

## 2020-10-31 NOTE — Telephone Encounter (Signed)
Spoke to patient and advised that we needed to schedule an appointment for this to make sure we get correct information in note. Scheduled her for 11/09/20 @ 4:00 pm.  Dm/cma

## 2020-11-07 ENCOUNTER — Telehealth: Payer: Self-pay

## 2020-11-07 ENCOUNTER — Telehealth: Payer: 59

## 2020-11-07 NOTE — Telephone Encounter (Signed)
  Care Management   Follow Up Note   11/07/2020 Name: Kelli Hudson MRN: [id number] DOB: [birth year]   Referred by: Loyola Mast, MD Reason for referral : No chief complaint on file.   An unsuccessful telephone outreach was attempted today. The patient was referred to the case management team for assistance with care management and care coordination.   Follow Up Plan: A HIPPA compliant phone message was left for the patient providing contact information and requesting a return call.   George Ina RN,BSN,CCM RN Case Manager [place] [phone]

## 2020-11-08 ENCOUNTER — Telehealth: Payer: Self-pay | Admitting: *Deleted

## 2020-11-08 NOTE — Chronic Care Management (AMB) (Signed)
  Care Management   Note  11/08/2020 Name: Donnica Jarnagin MRN: [id number] DOB: 01-28-1991  Forestine Macho is a 30 y.o. year old female who is a primary care patient of Loyola Mast, MD and is actively engaged with the care management team. I reached out to [employer] by phone today to assist with re-scheduling a follow up visit with the RN Case Manager  Follow up plan: Unsuccessful telephone outreach attempt made. A HIPAA compliant phone message was left for the patient providing contact information and requesting a return call.   [name], CCMA Care Guide, Embedded Care Coordination [facility]  Care Management  Direct Dial: [phone]

## 2020-11-09 ENCOUNTER — Ambulatory Visit: Payer: 59 | Admitting: Family Medicine

## 2020-11-09 ENCOUNTER — Other Ambulatory Visit: Payer: Self-pay

## 2020-11-09 ENCOUNTER — Ambulatory Visit (INDEPENDENT_AMBULATORY_CARE_PROVIDER_SITE_OTHER): Payer: 59 | Admitting: Family Medicine

## 2020-11-09 ENCOUNTER — Encounter: Payer: Self-pay | Admitting: Family Medicine

## 2020-11-09 VITALS — BP 118/80 | HR 98 | Temp 97.8°F | Ht 68.0 in | Wt 173.6 lb

## 2020-11-09 DIAGNOSIS — E1165 Type 2 diabetes mellitus with hyperglycemia: Secondary | ICD-10-CM

## 2020-11-09 DIAGNOSIS — E1142 Type 2 diabetes mellitus with diabetic polyneuropathy: Secondary | ICD-10-CM

## 2020-11-09 DIAGNOSIS — E1143 Type 2 diabetes mellitus with diabetic autonomic (poly)neuropathy: Secondary | ICD-10-CM

## 2020-11-09 DIAGNOSIS — Z89422 Acquired absence of other left toe(s): Secondary | ICD-10-CM | POA: Insufficient documentation

## 2020-11-09 DIAGNOSIS — E113593 Type 2 diabetes mellitus with proliferative diabetic retinopathy without macular edema, bilateral: Secondary | ICD-10-CM | POA: Diagnosis not present

## 2020-11-09 DIAGNOSIS — IMO0002 Reserved for concepts with insufficient information to code with codable children: Secondary | ICD-10-CM

## 2020-11-09 NOTE — Progress Notes (Signed)
[facility] [address] [place] [address] Dept: [phone] Dept Fax: [phone]  Chronic Care Office Visit  Subjective:    Patient ID: Kelli Hudson, female    DOB: Mar 21, 1990, 30 y.o..   MRN: [id number]  Chief Complaint  Patient presents with   Follow-up    6 wk follow up     History of Present Illness:  Patient is in today for reassessment of chronic medical issues.  Kelli Hudson notes that she is doing better these days. She has not had an admission for gastroparesis in 2 months. She recently had a laser treatment for her diabetic retinopathy an notes her vision has improved some. She notes her blood sugars are running 80-120s. She is scheduled to see a new endocrinologist in Nov. At that time, they will be addressing getting her started on an insulin pump.   Since her last visit, Kelli Hudson started a new job with [employer]. 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 She finds it difficult to stand for an 8 hour shift doing this. Some of her co-workers are able to sit to do this sewing work. She would like assistance with making a request for reasonable accomodation.  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 pylori infection 10/07/2019   Depression with anxiety 07/05/2019   Diabetic polyneuropathy associated with type 2 diabetes mellitus ([place]) 09/24/2017   UTI (urinary tract infection) 06/14/2011   Goiter 10/05/2009   Uncontrolled diabetes mellitus ([place]) 10/05/2009   Essential hypertension 10/05/2009   Past Surgical History:  Procedure Laterality Date   AMPUTATION TOE Left 03/10/2018   Procedure: AMPUTATION FIFTH TOE;  Surgeon: Trula Slade, DPM;  Location: [facility];  Service: Podiatry;  Laterality: Left;   BIOPSY  01/28/2020   Procedure: BIOPSY;  Surgeon: Otis Brace, MD;  Location: [facility];  Service: Gastroenterology;;   ESOPHAGOGASTRODUODENOSCOPY N/A 01/28/2020   Procedure: ESOPHAGOGASTRODUODENOSCOPY (EGD);  Surgeon: Otis Brace, MD;  Location: [facility];  Service: Gastroenterology;  Laterality: N/A;   ESOPHAGOGASTRODUODENOSCOPY (EGD) WITH PROPOFOL Left 09/08/2015   Procedure: ESOPHAGOGASTRODUODENOSCOPY (EGD) WITH PROPOFOL;  Surgeon: Arta Silence, MD;  Location: [facility]COPY;  Service: Endoscopy;  Laterality: Left;   WISDOM TOOTH EXTRACTION     Family History  Problem Relation Age of Onset   Lung cancer Mother    Diabetes Mother    Bipolar disorder Father    Liver cancer Maternal Grandfather    Breast cancer Other        maternal great aunt   Heart disease Other    Ovarian cancer Other        maternal great aunt   Pancreatic cancer Maternal Uncle    Prostate cancer Maternal Uncle  Diabetes Maternal Aunt        x 2   Kidney disease Other        maternal great aunt   Outpatient Medications Prior to Visit  Medication Sig Dispense Refill   lisinopril (ZESTRIL) 5 MG tablet      albuterol (VENTOLIN HFA) 108 (90 Base) MCG/ACT inhaler Inhale into the lungs.     atenolol (TENORMIN) 25 MG tablet Take 1 tablet (25 mg  total) by mouth 2 (two) times daily. 60 tablet 2   Blood Glucose Monitoring Suppl (TRUE METRIX METER) w/Device KIT 1 Device by Does not apply route 4 (four) times daily. 1 kit 0   calcium gluconate 500 MG tablet Take 1 tablet (500 mg total) by mouth 3 (three) times daily. (Patient not taking: Reported on 10/03/2020) 270 tablet 3   Continuous Blood Gluc Receiver (DEXCOM G6 RECEIVER) DEVI 1 Device by Does not apply route daily. 1 each 0   Continuous Blood Gluc Sensor (DEXCOM G6 SENSOR) MISC 1 each by Does not apply route daily. 3 each 12   Continuous Blood Gluc Transmit (DEXCOM G6 TRANSMITTER) MISC 1 each by Does not apply route daily. 1 each 3   erythromycin (E-MYCIN) 250 MG tablet Take 250 mg by mouth 2 (two) times daily.     famotidine (PEPCID) 20 MG tablet Take 20 mg by mouth 2 (two) times daily.     glucose blood (TRUE METRIX BLOOD GLUCOSE TEST) test strip Use as instructed 100 each 12   hyoscyamine (LEVBID) 0.375 MG 12 hr tablet Take 1 tablet (0.375 mg total) by mouth every 12 (twelve) hours as needed for cramping (abdominal pain). 60 tablet 0   insulin glargine (LANTUS) 100 UNIT/ML injection Inject 0.18 mLs (18 Units total) into the skin daily. 10 mL 0   Insulin Pen Needle 31G X 5 MM MISC 1 Device by Does not apply route QID. For use with insulin pens 100 each 0   insulin regular (NOVOLIN R) 100 units/mL injection Inject 0-0.15 mLs (0-15 Units total) into the skin 3 (three) times daily before meals. CBG 70 - 120: 0 units CBG 121 - 150: 2 units CBG 151 - 200: 3 units CBG 201 - 250: 5 units CBG 251 - 300: 8 units CBG 301 - 350: 11 units CBG 351 - 400: 15 units 10 mL 0   lisinopril (ZESTRIL) 10 MG tablet Take 1 tablet (10 mg total) by mouth daily. 90 tablet 3   metoCLOPramide (REGLAN) 10 MG tablet Take 1 tablet (10 mg total) by mouth 3 (three) times daily before meals. 90 tablet 3   Multiple Vitamin (MULTIVITAMIN WITH MINERALS) TABS tablet Take 1 tablet by mouth daily.     ondansetron (ZOFRAN  ODT) 4 MG disintegrating tablet Take 1 tablet (4 mg total) by mouth every 6 (six) hours as needed for nausea or vomiting. 40 tablet 6   pantoprazole (PROTONIX) 40 MG tablet Take 40 mg by mouth daily.     promethazine (PHENERGAN) 12.5 MG tablet Take 1 tablet (12.5 mg total) by mouth every 6 (six) hours as needed for nausea or vomiting. 30 tablet 0   TRUEPLUS LANCETS 28G MISC assist with checking blood sugar TID and qhs 100 each 3   levalbuterol (XOPENEX HFA) 45 MCG/ACT inhaler Inhale 2 puffs into the lungs every 6 (six) hours as needed for wheezing or shortness of breath. 1 each 0   No facility-administered medications prior to visit.   No Known Allergies    Objective:  Today's Vitals   11/09/20 1607  BP: 118/80  Pulse: 98  Temp: 97.8 F (36.6 C)  TempSrc: Temporal  SpO2: 98%  Weight: 173 lb 9.6 oz (78.7 kg)  Height: 5' 8"  (1.727 m)   Body mass index is 26.4 kg/m.   General: Well developed, well nourished. No acute distress. Psych: Alert and oriented. Normal mood and affect.  Health Maintenance Due  Topic Date Due   OPHTHALMOLOGY EXAM  Never done   PAP SMEAR-Modifier  Never done   INFLUENZA VACCINE  Never done    Lab results:  Lab Results  Component Value Date   HGBA1C 10.5 (H) 09/09/2020   Assessment & Plan:   1. 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 We will reassess her A1c to see if we see a trend to confirm improvement. She will keep her appointment with the endocrinologist in Nov.  - Glucose, random - Hemoglobin A1c  2. Diabetic polyneuropathy associated with type 2 diabetes mellitus ([place]) 3. 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  4. Proliferative diabetic retinopathy of both eyes associated with type 2  diabetes mellitus, macular edema presence unspecified ([place]) She will continue to follow with ophthalmology.  [name], MD

## 2020-11-09 NOTE — Addendum Note (Signed)
Addended by: Varney Biles on: 11/09/2020 05:05 PM   Modules accepted: Orders

## 2020-11-12 ENCOUNTER — Other Ambulatory Visit: Payer: Self-pay

## 2020-11-12 ENCOUNTER — Other Ambulatory Visit (INDEPENDENT_AMBULATORY_CARE_PROVIDER_SITE_OTHER): Payer: 59

## 2020-11-12 DIAGNOSIS — E1165 Type 2 diabetes mellitus with hyperglycemia: Secondary | ICD-10-CM

## 2020-11-12 DIAGNOSIS — E1143 Type 2 diabetes mellitus with diabetic autonomic (poly)neuropathy: Secondary | ICD-10-CM | POA: Diagnosis not present

## 2020-11-12 DIAGNOSIS — IMO0002 Reserved for concepts with insufficient information to code with codable children: Secondary | ICD-10-CM

## 2020-11-12 LAB — GLUCOSE, RANDOM: Glucose, Bld: 328 mg/dL — ABNORMAL HIGH (ref 70–99)

## 2020-11-12 LAB — HEMOGLOBIN A1C: Hgb A1c MFr Bld: 9.8 % — ABNORMAL HIGH (ref 4.6–6.5)

## 2020-11-14 NOTE — Chronic Care Management (AMB) (Signed)
  Care Management   Note  11/14/2020 Name: Brent Noto MRN: [id number] DOB: 05/20/1990  Mckynna Vanloan is a 30 y.o. year old female who is a primary care patient of Loyola Mast, MD and is actively engaged with the care management team. I reached out to [employer] by phone today to assist with re-scheduling a follow up visit with the RN Case Manager  Follow up plan: 2nd attempt Unsuccessful telephone outreach attempt made. A HIPAA compliant phone message was left for the patient providing contact information and requesting a return call.   [name], CCMA Care Guide, Embedded Care Coordination [facility]  Care Management  Direct Dial: [phone]

## 2020-11-15 ENCOUNTER — Emergency Department (HOSPITAL_COMMUNITY): Payer: 59

## 2020-11-15 ENCOUNTER — Other Ambulatory Visit: Payer: Self-pay

## 2020-11-15 ENCOUNTER — Inpatient Hospital Stay (HOSPITAL_COMMUNITY)
Admission: EM | Admit: 2020-11-15 | Discharge: 2020-11-17 | DRG: 074 | Disposition: A | Discharge status: Home / Self Care | Payer: 59 | Attending: Family Medicine | Admitting: Family Medicine

## 2020-11-15 ENCOUNTER — Encounter (HOSPITAL_COMMUNITY): Payer: Self-pay

## 2020-11-15 DIAGNOSIS — R778 Other specified abnormalities of plasma proteins: Secondary | ICD-10-CM | POA: Diagnosis present

## 2020-11-15 DIAGNOSIS — Z8041 Family history of malignant neoplasm of ovary: Secondary | ICD-10-CM

## 2020-11-15 DIAGNOSIS — R52 Pain, unspecified: Secondary | ICD-10-CM

## 2020-11-15 DIAGNOSIS — K219 Gastro-esophageal reflux disease without esophagitis: Secondary | ICD-10-CM | POA: Diagnosis present

## 2020-11-15 DIAGNOSIS — Z794 Long term (current) use of insulin: Secondary | ICD-10-CM

## 2020-11-15 DIAGNOSIS — R079 Chest pain, unspecified: Secondary | ICD-10-CM

## 2020-11-15 DIAGNOSIS — R109 Unspecified abdominal pain: Secondary | ICD-10-CM

## 2020-11-15 DIAGNOSIS — Z803 Family history of malignant neoplasm of breast: Secondary | ICD-10-CM

## 2020-11-15 DIAGNOSIS — Z89422 Acquired absence of other left toe(s): Secondary | ICD-10-CM

## 2020-11-15 DIAGNOSIS — Z800 Family history of malignant neoplasm of digestive organs: Secondary | ICD-10-CM

## 2020-11-15 DIAGNOSIS — E1143 Type 2 diabetes mellitus with diabetic autonomic (poly)neuropathy: Secondary | ICD-10-CM | POA: Diagnosis present

## 2020-11-15 DIAGNOSIS — I152 Hypertension secondary to endocrine disorders: Secondary | ICD-10-CM | POA: Diagnosis present

## 2020-11-15 DIAGNOSIS — I10 Essential (primary) hypertension: Secondary | ICD-10-CM | POA: Diagnosis present

## 2020-11-15 DIAGNOSIS — E1043 Type 1 diabetes mellitus with diabetic autonomic (poly)neuropathy: Principal | ICD-10-CM | POA: Diagnosis present

## 2020-11-15 DIAGNOSIS — R112 Nausea with vomiting, unspecified: Secondary | ICD-10-CM

## 2020-11-15 DIAGNOSIS — Z818 Family history of other mental and behavioral disorders: Secondary | ICD-10-CM

## 2020-11-15 DIAGNOSIS — E1159 Type 2 diabetes mellitus with other circulatory complications: Secondary | ICD-10-CM | POA: Diagnosis present

## 2020-11-15 DIAGNOSIS — Z79899 Other long term (current) drug therapy: Secondary | ICD-10-CM

## 2020-11-15 DIAGNOSIS — Z833 Family history of diabetes mellitus: Secondary | ICD-10-CM

## 2020-11-15 DIAGNOSIS — Z801 Family history of malignant neoplasm of trachea, bronchus and lung: Secondary | ICD-10-CM

## 2020-11-15 DIAGNOSIS — K3184 Gastroparesis: Secondary | ICD-10-CM | POA: Diagnosis present

## 2020-11-15 DIAGNOSIS — Z20822 Contact with and (suspected) exposure to covid-19: Secondary | ICD-10-CM | POA: Diagnosis present

## 2020-11-15 LAB — CBC
HCT: 38.1 % (ref 36.0–46.0)
Hemoglobin: 12.9 g/dL (ref 12.0–15.0)
MCH: 29.5 pg (ref 26.0–34.0)
MCHC: 33.9 g/dL (ref 30.0–36.0)
MCV: 87.2 fL (ref 80.0–100.0)
Platelets: 398 10*3/uL (ref 150–400)
RBC: 4.37 MIL/uL (ref 3.87–5.11)
RDW: 12.3 % (ref 11.5–15.5)
WBC: 10.2 10*3/uL (ref 4.0–10.5)
nRBC: 0 % (ref 0.0–0.2)

## 2020-11-15 LAB — COMPREHENSIVE METABOLIC PANEL
ALT: 21 U/L (ref 0–44)
AST: 24 U/L (ref 15–41)
Albumin: 4 g/dL (ref 3.5–5.0)
Alkaline Phosphatase: 94 U/L (ref 38–126)
Anion gap: 13 (ref 5–15)
BUN: 23 mg/dL — ABNORMAL HIGH (ref 6–20)
CO2: 24 mmol/L (ref 22–32)
Calcium: 9.9 mg/dL (ref 8.9–10.3)
Chloride: 98 mmol/L (ref 98–111)
Creatinine, Ser: 1.13 mg/dL — ABNORMAL HIGH (ref 0.44–1.00)
GFR, Estimated: >60 mL/min (ref >60)
Glucose, Bld: 210 mg/dL — ABNORMAL HIGH (ref 70–99)
Potassium: 4.6 mmol/L (ref 3.5–5.1)
Sodium: 135 mmol/L (ref 135–145)
Total Bilirubin: 1.1 mg/dL (ref 0.3–1.2)
Total Protein: 8.5 g/dL — ABNORMAL HIGH (ref 6.5–8.1)

## 2020-11-15 LAB — I-STAT BETA HCG BLOOD, ED (MC, WL, AP ONLY): I-stat hCG, quantitative: <5 m[IU]/mL (ref <5)

## 2020-11-15 LAB — TROPONIN I (HIGH SENSITIVITY): Troponin I (High Sensitivity): 87 ng/L — ABNORMAL HIGH (ref <18)

## 2020-11-15 LAB — LIPASE, BLOOD: Lipase: 29 U/L (ref 11–51)

## 2020-11-15 MED ORDER — HYDROMORPHONE HCL 1 MG/ML IJ SOLN: 1.0000 mg | Freq: Once | INTRAMUSCULAR | Status: DC

## 2020-11-15 MED ORDER — HYDROMORPHONE HCL 1 MG/ML IJ SOLN
1.0000 mg | Freq: Once | INTRAMUSCULAR | Status: DC
Start: 2020-11-15 — End: 2020-11-15

## 2020-11-15 MED ORDER — ONDANSETRON HCL 4 MG/2ML IJ SOLN
4.0000 mg | Freq: Once | INTRAMUSCULAR | Status: AC
  Administered 2020-11-16: 4 mg via INTRAVENOUS
  Filled 2020-11-15: qty 2

## 2020-11-15 MED ORDER — SODIUM CHLORIDE 0.9 % IV BOLUS
1000.0000 mL | Freq: Once | INTRAVENOUS | Status: AC
  Administered 2020-11-16: 1000 mL via INTRAVENOUS

## 2020-11-15 NOTE — ED Provider Notes (Signed)
Emergency Medicine Provider Triage Evaluation Note  Kelli Hudson , a 30 y.o. female  was evaluated in triage.  Pt complains of gastroparesis type pain - abdominal and chest pain since Monday. She reports she was admitted at [facility] but left because they weren't addressing her pain. She reports she typically gets Dilaudid when she is admitted here so she left and came here. She also complains of nausea and vomiting however her vomiting was mostly under control today at [facility]. No fevers or chills. States she typically gets CP with same.  Review of Systems  Positive: + abdominal pain, chest pain, nausea, vomiting Negative: - fevers  Physical Exam  BP 124/76 (BP Location: Right Arm)   Pulse (!) 112   Temp 98.9 F (37.2 C) (Oral)   Resp 18   Ht 5\' 8"  (1.727 m)   Wt 78.5 kg   SpO2 99%   BMI 26.30 kg/m  Gen:   Awake, no distress   Resp:  Normal effort  MSK:   Moves extremities without difficulty  Other:  Diffuse abdominal TTP  Medical Decision Making  Medically screening exam initiated at 7:13 PM.  Appropriate orders placed.  Padme Arriaga was informed that the remainder of the evaluation will be completed by another provider, this initial triage assessment does not replace that evaluation, and the importance of remaining in the ED until their evaluation is complete.     [name], PA-C 11/15/20 1914    11/17/20, DO 11/15/20 2351

## 2020-11-15 NOTE — ED Notes (Signed)
Tried to get pt's blood but was unsuccessful 

## 2020-11-15 NOTE — ED Provider Notes (Signed)
[facility] DEPT Provider Note   CSN: [id number] Arrival date & time: 11/15/20  1759     History Chief Complaint  Patient presents with   Chest Pain   Abdominal Pain    Kelli Hudson is a 30 y.o. female.  The history is provided by the patient.  Abdominal Pain Pain location:  Epigastric Pain quality: aching   Pain radiates to:  Chest Pain severity:  Moderate Onset quality:  Gradual Timing:  Constant Progression:  Unchanged Chronicity:  Recurrent Relieved by:  Nothing Worsened by:  Nothing Associated symptoms: chest pain, nausea and vomiting   Associated symptoms: no anorexia, no belching, no chills, no constipation, no cough, no diarrhea, no dysuria, no fatigue, no fever, no hematuria, no shortness of breath and no sore throat       Past Medical History:  Diagnosis Date   Acute H. 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 ([place]) 11/09/2020   Diabetic retinopathy of both eyes associated with type 2 diabetes mellitus ([place]) 09/24/2020   Diabetic gastroparesis associated with type 1 diabetes mellitus ([place]) 08/12/2020   AKI (acute kidney injury) ([place]) 07/25/2020   GERD (gastroesophageal reflux disease) 07/23/2020   Diabetic nephropathy ([place]) 06/27/2020   Borderline hyperlipidemia 05/23/2020   Gastroparesis due to DM ([place]) 05/11/2020   Uncontrolled type 2 diabetes mellitus with gastroparesis ([place]) 05/09/2020   Normocytic anemia 04/20/2020   Hyperosmolar hyperglycemic state (HHS) ([place]) 02/24/2020   Intractable nausea and vomiting    Intractable abdominal pain 01/24/2020   H. pylori infection 10/07/2019   Depression with anxiety 07/05/2019   Diabetic polyneuropathy associated with  type 2 diabetes mellitus ([place]) 09/24/2017   UTI (urinary tract infection) 06/14/2011   Goiter 10/05/2009   Uncontrolled diabetes mellitus ([place]) 10/05/2009   Essential hypertension 10/05/2009    Past Surgical History:  Procedure Laterality Date   AMPUTATION TOE Left 03/10/2018   Procedure: AMPUTATION FIFTH TOE;  Surgeon: Trula Slade, DPM;  Location: [place];  Service: Podiatry;  Laterality: Left;   BIOPSY  01/28/2020   Procedure: BIOPSY;  Surgeon: Otis Brace, MD;  Location: [facility];  Service: Gastroenterology;;   ESOPHAGOGASTRODUODENOSCOPY N/A 01/28/2020   Procedure: ESOPHAGOGASTRODUODENOSCOPY (EGD);  Surgeon: Otis Brace, MD;  Location: [facility];  Service: Gastroenterology;  Laterality: N/A;   ESOPHAGOGASTRODUODENOSCOPY (EGD) WITH PROPOFOL Left 09/08/2015   Procedure: ESOPHAGOGASTRODUODENOSCOPY (EGD) WITH PROPOFOL;  Surgeon: Arta Silence, MD;  Location: [facility]COPY;  Service: Endoscopy;  Laterality: Left;   WISDOM TOOTH EXTRACTION       OB History     Gravida  0   Para  0   Term  0   Preterm  0   AB  0   Living  0      SAB  0   IAB  0   Ectopic  0   Multiple  0   Live Births  0           Family History  Problem Relation Age of Onset   Lung cancer Mother    Diabetes Mother    Bipolar disorder Father    Liver cancer Maternal Grandfather    Breast cancer Other  maternal great aunt   Heart disease Other    Ovarian cancer Other        maternal great aunt   Pancreatic cancer Maternal Uncle    Prostate cancer Maternal Uncle    Diabetes Maternal Aunt        x 2   Kidney disease Other        maternal great aunt    Social History   Tobacco Use   Smoking status: Never   Smokeless tobacco: Never  Vaping Use   Vaping Use: Never used  Substance Use Topics   Alcohol use: Yes    Alcohol/week: 1.0 standard drink    Types: 1 Shots of liquor per week    Comment: occasional    Drug use: No    Home  Medications Prior to Admission medications   Medication Sig Start Date End Date Taking? Authorizing Provider  albuterol (VENTOLIN HFA) 108 (90 Base) MCG/ACT inhaler Inhale into the lungs.    [provider]  atenolol (TENORMIN) 25 MG tablet Take 1 tablet (25 mg total) by mouth 2 (two) times daily. 01/30/20   Mariel Aloe, MD  Blood Glucose Monitoring Suppl (TRUE METRIX METER) w/Device KIT 1 Device by Does not apply route 4 (four) times daily. 07/05/19   Libby Maw, MD  calcium gluconate 500 MG tablet Take 1 tablet (500 mg total) by mouth 3 (three) times daily. Patient not taking: Reported on 10/03/2020 09/24/20   [name], MD  Continuous Blood Gluc Receiver (DEXCOM G6 RECEIVER) DEVI 1 Device by Does not apply route daily. 09/24/20   [name], MD  Continuous Blood Gluc Sensor (DEXCOM G6 SENSOR) MISC 1 each by Does not apply route daily. 09/24/20   [name], MD  Continuous Blood Gluc Transmit (DEXCOM G6 TRANSMITTER) MISC 1 each by Does not apply route daily. 09/24/20   [name], MD  erythromycin (E-MYCIN) 250 MG tablet Take 250 mg by mouth 2 (two) times daily. 10/15/20   [provider]  famotidine (PEPCID) 20 MG tablet Take 20 mg by mouth 2 (two) times daily. 09/06/20   [provider]  glucose blood (TRUE METRIX BLOOD GLUCOSE TEST) test strip Use as instructed 09/03/20   [name], MD  hyoscyamine (LEVBID) 0.375 MG 12 hr tablet Take 1 tablet (0.375 mg total) by mouth every 12 (twelve) hours as needed for cramping (abdominal pain). 05/14/20   Raiford Noble Latif, DO  insulin glargine (LANTUS) 100 UNIT/ML injection Inject 0.18 mLs (18 Units total) into the skin daily. 08/15/20   Aline August, MD  Insulin Pen Needle 31G X 5 MM MISC 1 Device by Does not apply route QID. For use with insulin pens 07/05/19   Libby Maw, MD  insulin regular (NOVOLIN R) 100 units/mL injection Inject 0-0.15 mLs (0-15 Units total) into the skin 3  (three) times daily before meals. CBG 70 - 120: 0 units CBG 121 - 150: 2 units CBG 151 - 200: 3 units CBG 201 - 250: 5 units CBG 251 - 300: 8 units CBG 301 - 350: 11 units CBG 351 - 400: 15 units 05/14/20   Sheikh, Omair Latif, DO  lisinopril (ZESTRIL) 10 MG tablet Take 1 tablet (10 mg total) by mouth daily. 09/27/20   [name], MD  metoCLOPramide (REGLAN) 10 MG tablet Take 1 tablet (10 mg total) by mouth 3 (three) times daily before meals. 10/17/20   [name], MD  Multiple Vitamin (  MULTIVITAMIN WITH MINERALS) TABS tablet Take 1 tablet by mouth daily.    [provider]  ondansetron (ZOFRAN ODT) 4 MG disintegrating tablet Take 1 tablet (4 mg total) by mouth every 6 (six) hours as needed for nausea or vomiting. 04/20/20   Esterwood, Amy S, PA-C  pantoprazole (PROTONIX) 40 MG tablet Take 40 mg by mouth daily. 09/17/20   [provider]  promethazine (PHENERGAN) 12.5 MG tablet Take 1 tablet (12.5 mg total) by mouth every 6 (six) hours as needed for nausea or vomiting. 05/14/20   Raiford Noble [place], DO  TRUEPLUS LANCETS 28G MISC assist with checking blood sugar TID and qhs 09/10/15   Zettie Pho S, PA-C  insulin aspart (NOVOLOG) 100 UNIT/ML FlexPen Inject 5 Units into the skin 3 (three) times daily with meals. 02/23/18 07/05/19  Donne Hazel, MD    Allergies    Patient has no known allergies.  Review of Systems   Review of Systems  Constitutional:  Negative for chills, fatigue and fever.  HENT:  Negative for ear pain and sore throat.   Eyes:  Negative for pain and visual disturbance.  Respiratory:  Negative for cough and shortness of breath.   Cardiovascular:  Positive for chest pain. Negative for palpitations.  Gastrointestinal:  Positive for nausea and vomiting. Negative for abdominal pain, anorexia, constipation and diarrhea.  Genitourinary:  Negative for dysuria and hematuria.  Musculoskeletal:  Negative for arthralgias and back pain.  Skin:  Negative for color  change and rash.  Neurological:  Negative for seizures and syncope.  All other systems reviewed and are negative.  Physical Exam Updated Vital Signs BP (!) 161/109   Pulse (!) 116   Temp 98.9 F (37.2 C) (Oral)   Resp 17   Ht 5' 8" (1.727 m)   Wt 78.5 kg   SpO2 99%   BMI 26.30 kg/m   Physical Exam Vitals and nursing note reviewed.  Constitutional:      General: She is not in acute distress.    Appearance: She is well-developed. She is not ill-appearing.  HENT:     Head: Normocephalic and atraumatic.  Eyes:     Extraocular Movements: Extraocular movements intact.     Conjunctiva/sclera: Conjunctivae normal.     Pupils: Pupils are equal, round, and reactive to light.  Cardiovascular:     Rate and Rhythm: Regular rhythm. Tachycardia present.     Pulses:          Radial pulses are 2+ on the right side and 2+ on the left side.     Heart sounds: No murmur heard. Pulmonary:     Effort: Pulmonary effort is normal. No respiratory distress.     Breath sounds: Normal breath sounds. No decreased breath sounds, wheezing or rhonchi.  Abdominal:     General: There is abdominal bruit (diffusely).     Palpations: Abdomen is soft.     Tenderness: There is no abdominal tenderness.  Musculoskeletal:     Cervical back: Normal range of motion and neck supple.  Skin:    General: Skin is warm and dry.     Capillary Refill: Capillary refill takes less than 2 seconds.  Neurological:     General: No focal deficit present.     Mental Status: She is alert.    ED Results / Procedures / Treatments   Labs (all labs ordered are listed, but only abnormal results are displayed) Labs Reviewed  COMPREHENSIVE METABOLIC PANEL - Abnormal; Notable for the following  components:      Result Value   Glucose, Bld 210 (*)    BUN 23 (*)    Creatinine, Ser 1.13 (*)    Total Protein 8.5 (*)    All other components within normal limits  TROPONIN I (HIGH SENSITIVITY) - Abnormal; Notable for the following  components:   Troponin I (High Sensitivity) 87 (*)    All other components within normal limits  RESP PANEL BY RT-PCR (FLU A&B, COVID) ARPGX2  LIPASE, BLOOD  CBC  URINALYSIS, ROUTINE W REFLEX MICROSCOPIC  D-DIMER, QUANTITATIVE  I-STAT BETA HCG BLOOD, ED ([facility], [facility], [facility] ONLY)    EKG EKG Interpretation  Date/Time:  Thursday November 15 2020 19:13:43 EDT Ventricular Rate:  116 PR Interval:  131 QRS Duration: 78 QT Interval:  292 QTC Calculation: 406 R Axis:   80 Text Interpretation: Sinus tachycardia Right atrial enlargement Confirmed by Lennice Sites ([id number]) on 11/15/2020 10:46:13 PM  Radiology CT ABDOMEN PELVIS WO CONTRAST  Result Date: 11/15/2020 CLINICAL DATA:  Abdominal pain, evaluate for diverticulitis. EXAM: CT ABDOMEN AND PELVIS WITHOUT CONTRAST TECHNIQUE: Multidetector CT imaging of the abdomen and pelvis was performed following the standard protocol without IV contrast. COMPARISON:  CT abdomen and pelvis 06/05/2020 FINDINGS: Lower chest: No acute abnormality. Hepatobiliary: Gallbladder sludge is present. There is no biliary ductal dilatation. The liver appears within normal limits. Pancreas: Unremarkable. No pancreatic ductal dilatation or surrounding inflammatory changes. Spleen: Normal in size without focal abnormality. Adrenals/Urinary Tract: Adrenal glands are unremarkable. Kidneys are normal, without renal calculi, focal lesion, or hydronephrosis. Bladder is unremarkable. Stomach/Bowel: Stomach is within normal limits. Appendix appears normal. No evidence of bowel wall thickening, distention, or inflammatory changes. Vascular/Lymphatic: No significant vascular findings are present. No enlarged abdominal or pelvic lymph nodes. Reproductive: Uterus and bilateral adnexa are unremarkable. Other: No abdominal wall hernia or abnormality. No abdominopelvic ascites. Musculoskeletal: No acute or significant osseous findings. IMPRESSION: 1. Gallbladder sludge. 2. No other acute localizing  process in the abdomen or pelvis. Electronically Signed   By: Ronney Asters M.D.   On: 11/15/2020 23:31   DG Chest Portable 1 View  Result Date: 11/15/2020 CLINICAL DATA:  Upper abdominal and chest pain. EXAM: PORTABLE CHEST 1 VIEW COMPARISON:  CT abdomen and pelvis 11/15/2020. FINDINGS: The heart size and mediastinal contours are within normal limits. Both lungs are clear. The visualized skeletal structures are unremarkable. IMPRESSION: No active disease. Electronically Signed   By: Ronney Asters M.D.   On: 11/15/2020 23:42    Procedures Procedures   Medications Ordered in ED Medications  sodium chloride 0.9 % bolus 1,000 mL (has no administration in time range)  ondansetron (ZOFRAN) injection 4 mg (has no administration in time range)  HYDROmorphone (DILAUDID) injection 1 mg (has no administration in time range)  pantoprazole (PROTONIX) injection 40 mg (has no administration in time range)    ED Course  I have reviewed the triage vital signs and the nursing notes.  Pertinent labs & imaging results that were available during my care of the patient were reviewed by me and considered in my medical decision making (see chart for details).    MDM Rules/Calculators/A&P                           Kelli Hudson is a 30 year old female with history of hypertension, diabetes, gastroparesis, reflux who presents the ED with chest pain, abdominal pain, nausea.  Patient with unremarkable vitals except for mild tachycardia in  the 120s.  EKG shows a sinus tachycardia.  No ischemic changes.  Patient states that she left AMA from outside hospital today where she was treated for DKA and gastroparesis.  She states that she was still not tolerating p.o. and that they were not adequately treating her pain.  She states that Dilaudid is the medication that helps the most with her pain.  She is had ketamine in the past with some help.  Typically Haldol and other antiemetics have been very helpful.  She  appears to be very uncomfortable on exam.  She is difficult to get a good abdominal exam as she is diffusely tender.  She is staying that most of her pain runs on the center of her chest and to her upper belly.  Lab work has been initiated prior to my evaluation.  We will give her fluid bolus, IV Dilaudid, IV Zofran and reevaluate.  We will get new CT scan of her belly as well as chest x-ray.  She did have a CT scan outpatient that shows some gastroenteritis but otherwise is unremarkable.  C test is negative.  Lipase normal doubt pancreatitis.  Creatinine is 1.1.  Glucose 210.  Bicarb and anion gap are normal.  Doubt DKA.  No leukocytosis.  Chest x-ray without evidence of infection.  CT scan overall unremarkable except for maybe some gallbladder sludge.  We will get ultrasound of her gallbladder to fully evaluate.  Troponin surprisingly elevated at 87.  Will trend to get a D-dimer.  She does not have any specific PE risk factors.  Suspect that troponin could be elevated secondary to demand given her tachycardia.  She denies any recent illnesses but could be myocarditis.  Overall patient awaiting D-dimer, repeat troponin, right upper quadrant ultrasound, pain medicine and IV fluids and reevaluation.  Anticipate admission to medicine for further work-up and supportive care.  Please see oncoming ED provider's note for further results, evaluation, disposition of the patient.  This chart was dictated using voice recognition software.  Despite best efforts to proofread,  errors can occur which can change the documentation meaning.   Final Clinical Impression(s) / ED Diagnoses Final diagnoses:  Pain  Chest pain, unspecified type  Abdominal pain, unspecified abdominal location  Intractable nausea and vomiting    Rx / DC Orders ED Discharge Orders     None        Lennice Sites, DO 11/16/20 0020

## 2020-11-15 NOTE — ED Notes (Signed)
[id number] attempted to stick pt for IV access. Unable to obtain. Bloodwork obtained. IV consult put in.

## 2020-11-15 NOTE — ED Triage Notes (Signed)
Patient  states that she has chest and abdominal pain x 4 days. Patient states, "I have gastroparesis." Patient states she went to [facility] in [place]. and they were not treating her symptoms correctly, so she left.

## 2020-11-15 NOTE — ED Notes (Signed)
Per Kathryne Sharper RN, states patient left their facility AMA-states she was being treated for DKA and gastroparesis-states once they stopped her IV dilaudid she threatened to leave-patient states she was coming to [facility] ED to "get her Dilaudid"-RN states he DKA had resolved and she not longer needed opiates-

## 2020-11-16 ENCOUNTER — Emergency Department (HOSPITAL_COMMUNITY): Payer: 59

## 2020-11-16 ENCOUNTER — Encounter (HOSPITAL_COMMUNITY): Payer: Self-pay

## 2020-11-16 DIAGNOSIS — Z794 Long term (current) use of insulin: Secondary | ICD-10-CM | POA: Diagnosis not present

## 2020-11-16 DIAGNOSIS — R778 Other specified abnormalities of plasma proteins: Secondary | ICD-10-CM | POA: Diagnosis present

## 2020-11-16 DIAGNOSIS — Z801 Family history of malignant neoplasm of trachea, bronchus and lung: Secondary | ICD-10-CM | POA: Diagnosis not present

## 2020-11-16 DIAGNOSIS — Z89422 Acquired absence of other left toe(s): Secondary | ICD-10-CM | POA: Diagnosis not present

## 2020-11-16 DIAGNOSIS — K219 Gastro-esophageal reflux disease without esophagitis: Secondary | ICD-10-CM | POA: Diagnosis present

## 2020-11-16 DIAGNOSIS — R112 Nausea with vomiting, unspecified: Secondary | ICD-10-CM

## 2020-11-16 DIAGNOSIS — E1043 Type 1 diabetes mellitus with diabetic autonomic (poly)neuropathy: Secondary | ICD-10-CM | POA: Diagnosis present

## 2020-11-16 DIAGNOSIS — R109 Unspecified abdominal pain: Secondary | ICD-10-CM

## 2020-11-16 DIAGNOSIS — K3184 Gastroparesis: Secondary | ICD-10-CM

## 2020-11-16 DIAGNOSIS — Z79899 Other long term (current) drug therapy: Secondary | ICD-10-CM | POA: Diagnosis not present

## 2020-11-16 DIAGNOSIS — Z818 Family history of other mental and behavioral disorders: Secondary | ICD-10-CM | POA: Diagnosis not present

## 2020-11-16 DIAGNOSIS — E1143 Type 2 diabetes mellitus with diabetic autonomic (poly)neuropathy: Secondary | ICD-10-CM | POA: Diagnosis not present

## 2020-11-16 DIAGNOSIS — R52 Pain, unspecified: Secondary | ICD-10-CM | POA: Diagnosis present

## 2020-11-16 DIAGNOSIS — Z803 Family history of malignant neoplasm of breast: Secondary | ICD-10-CM | POA: Diagnosis not present

## 2020-11-16 DIAGNOSIS — Z8041 Family history of malignant neoplasm of ovary: Secondary | ICD-10-CM | POA: Diagnosis not present

## 2020-11-16 DIAGNOSIS — Z800 Family history of malignant neoplasm of digestive organs: Secondary | ICD-10-CM | POA: Diagnosis not present

## 2020-11-16 DIAGNOSIS — I10 Essential (primary) hypertension: Secondary | ICD-10-CM | POA: Diagnosis present

## 2020-11-16 DIAGNOSIS — Z20822 Contact with and (suspected) exposure to covid-19: Secondary | ICD-10-CM | POA: Diagnosis present

## 2020-11-16 DIAGNOSIS — Z833 Family history of diabetes mellitus: Secondary | ICD-10-CM | POA: Diagnosis not present

## 2020-11-16 LAB — COMPREHENSIVE METABOLIC PANEL
ALT: 16 U/L (ref 0–44)
AST: 17 U/L (ref 15–41)
Albumin: 2.9 g/dL — ABNORMAL LOW (ref 3.5–5.0)
Alkaline Phosphatase: 67 U/L (ref 38–126)
Anion gap: 7 (ref 5–15)
BUN: 21 mg/dL — ABNORMAL HIGH (ref 6–20)
CO2: 25 mmol/L (ref 22–32)
Calcium: 9.1 mg/dL (ref 8.9–10.3)
Chloride: 103 mmol/L (ref 98–111)
Creatinine, Ser: 1.06 mg/dL — ABNORMAL HIGH (ref 0.44–1.00)
GFR, Estimated: >60 mL/min (ref >60)
Glucose, Bld: 225 mg/dL — ABNORMAL HIGH (ref 70–99)
Potassium: 4.3 mmol/L (ref 3.5–5.1)
Sodium: 135 mmol/L (ref 135–145)
Total Bilirubin: 0.8 mg/dL (ref 0.3–1.2)
Total Protein: 6.2 g/dL — ABNORMAL LOW (ref 6.5–8.1)

## 2020-11-16 LAB — CBG MONITORING, ED
Glucose-Capillary: 206 mg/dL — ABNORMAL HIGH (ref 70–99)
Glucose-Capillary: 132 mg/dL — ABNORMAL HIGH (ref 70–99)

## 2020-11-16 LAB — RESP PANEL BY RT-PCR (FLU A&B, COVID) ARPGX2
Influenza A by PCR: NEGATIVE
Influenza B by PCR: NEGATIVE
SARS Coronavirus 2 by RT PCR: NEGATIVE

## 2020-11-16 LAB — GLUCOSE, CAPILLARY
Glucose-Capillary: 227 mg/dL — ABNORMAL HIGH (ref 70–99)
Glucose-Capillary: 99 mg/dL (ref 70–99)
Glucose-Capillary: 166 mg/dL — ABNORMAL HIGH (ref 70–99)

## 2020-11-16 LAB — URINALYSIS, ROUTINE W REFLEX MICROSCOPIC
Bacteria, UA: NONE SEEN
Bilirubin Urine: NEGATIVE
Glucose, UA: 150 mg/dL — AB
Hgb urine dipstick: NEGATIVE
Ketones, ur: 20 mg/dL — AB
Leukocytes,Ua: NEGATIVE
Nitrite: NEGATIVE
Protein, ur: >=300 mg/dL — AB
Specific Gravity, Urine: 1.034 — ABNORMAL HIGH (ref 1.005–1.030)
pH: 5 (ref 5.0–8.0)

## 2020-11-16 LAB — CBC
HCT: 29.9 % — ABNORMAL LOW (ref 36.0–46.0)
Hemoglobin: 10 g/dL — ABNORMAL LOW (ref 12.0–15.0)
MCH: 29.5 pg (ref 26.0–34.0)
MCHC: 33.4 g/dL (ref 30.0–36.0)
MCV: 88.2 fL (ref 80.0–100.0)
Platelets: 310 10*3/uL (ref 150–400)
RBC: 3.39 MIL/uL — ABNORMAL LOW (ref 3.87–5.11)
RDW: 12.4 % (ref 11.5–15.5)
WBC: 9.8 10*3/uL (ref 4.0–10.5)
nRBC: 0 % (ref 0.0–0.2)

## 2020-11-16 LAB — D-DIMER, QUANTITATIVE: D-Dimer, Quant: 0.9 ug/mL-FEU — ABNORMAL HIGH (ref 0.00–0.50)

## 2020-11-16 LAB — TROPONIN I (HIGH SENSITIVITY): Troponin I (High Sensitivity): 89 ng/L — ABNORMAL HIGH (ref <18)

## 2020-11-16 MED ORDER — PANTOPRAZOLE SODIUM 40 MG IV SOLR
40.0000 mg | Freq: Once | INTRAVENOUS | Status: AC
  Administered 2020-11-16: 40 mg via INTRAVENOUS
  Filled 2020-11-16: qty 40

## 2020-11-16 MED ORDER — ACETAMINOPHEN 325 MG PO TABS: 650.0000 mg | ORAL_TABLET | Freq: Four times a day (QID) | ORAL | Status: DC | PRN

## 2020-11-16 MED ORDER — ONDANSETRON HCL 4 MG PO TABS: 4.0000 mg | ORAL_TABLET | Freq: Four times a day (QID) | ORAL | Status: DC | PRN

## 2020-11-16 MED ORDER — KETOROLAC TROMETHAMINE 30 MG/ML IJ SOLN
30.0000 mg | Freq: Four times a day (QID) | INTRAMUSCULAR | Status: DC | PRN
  Administered 2020-11-16: 30 mg via INTRAVENOUS
  Filled 2020-11-16 (×2): qty 1

## 2020-11-16 MED ORDER — IOHEXOL 350 MG/ML SOLN
80.0000 mL | Freq: Once | INTRAVENOUS | Status: AC | PRN
  Administered 2020-11-16: 80 mL via INTRAVENOUS

## 2020-11-16 MED ORDER — ASPIRIN 81 MG PO CHEW
324.0000 mg | CHEWABLE_TABLET | Freq: Once | ORAL | Status: AC
  Administered 2020-11-16: 324 mg via ORAL
  Filled 2020-11-16: qty 4

## 2020-11-16 MED ORDER — METOCLOPRAMIDE HCL 5 MG/ML IJ SOLN
10.0000 mg | Freq: Four times a day (QID) | INTRAMUSCULAR | Status: DC
  Administered 2020-11-16 – 2020-11-17 (×5): 10 mg via INTRAVENOUS
  Filled 2020-11-16 (×5): qty 2

## 2020-11-16 MED ORDER — SODIUM CHLORIDE 0.9 % IV SOLN
500.0000 mg | Freq: Three times a day (TID) | INTRAVENOUS | Status: DC
  Administered 2020-11-16 – 2020-11-17 (×4): 500 mg via INTRAVENOUS
  Filled 2020-11-16 (×6): qty 10

## 2020-11-16 MED ORDER — ENOXAPARIN SODIUM 40 MG/0.4ML IJ SOSY
40.0000 mg | PREFILLED_SYRINGE | INTRAMUSCULAR | Status: DC
  Administered 2020-11-16: 40 mg via SUBCUTANEOUS
  Filled 2020-11-16: qty 0.4

## 2020-11-16 MED ORDER — LISINOPRIL 10 MG PO TABS
10.0000 mg | ORAL_TABLET | Freq: Every day | ORAL | Status: DC
  Administered 2020-11-16: 10 mg via ORAL
  Filled 2020-11-16: qty 1

## 2020-11-16 MED ORDER — INSULIN GLARGINE-YFGN 100 UNIT/ML ~~LOC~~ SOLN
12.0000 [IU] | Freq: Every day | SUBCUTANEOUS | Status: DC
  Administered 2020-11-16: 12 [IU] via SUBCUTANEOUS
  Filled 2020-11-16: qty 0.12

## 2020-11-16 MED ORDER — HYDROMORPHONE HCL 1 MG/ML IJ SOLN
1.0000 mg | Freq: Once | INTRAMUSCULAR | Status: AC
  Administered 2020-11-16: 1 mg via INTRAVENOUS
  Filled 2020-11-16: qty 1

## 2020-11-16 MED ORDER — ATENOLOL 25 MG PO TABS
25.0000 mg | ORAL_TABLET | Freq: Two times a day (BID) | ORAL | Status: DC
  Administered 2020-11-16 (×2): 25 mg via ORAL
  Filled 2020-11-16 (×3): qty 1

## 2020-11-16 MED ORDER — STERILE WATER FOR INJECTION IJ SOLN
INTRAMUSCULAR | Status: AC
  Filled 2020-11-16: qty 10

## 2020-11-16 MED ORDER — ACETAMINOPHEN 650 MG RE SUPP: 650.0000 mg | Freq: Four times a day (QID) | RECTAL | Status: DC | PRN

## 2020-11-16 MED ORDER — ONDANSETRON HCL 4 MG/2ML IJ SOLN: 4.0000 mg | Freq: Four times a day (QID) | INTRAMUSCULAR | Status: DC | PRN

## 2020-11-16 MED ORDER — SODIUM CHLORIDE (PF) 0.9 % IJ SOLN
INTRAMUSCULAR | Status: AC
  Filled 2020-11-16: qty 50

## 2020-11-16 MED ORDER — INSULIN ASPART 100 UNIT/ML IJ SOLN
0.0000 [IU] | INTRAMUSCULAR | Status: DC
Start: 2020-11-16 — End: 2020-11-17
  Administered 2020-11-16 (×2): 3 [IU] via SUBCUTANEOUS
  Administered 2020-11-16 – 2020-11-17 (×2): 2 [IU] via SUBCUTANEOUS
  Filled 2020-11-16: qty 0.09

## 2020-11-16 NOTE — ED Provider Notes (Signed)
Care transferred to me. Troponins are flat but elevated. Unclear cause, doubt ACS. CTA negative for PE. Will admit to hospitalist service. D/w Dr. Jacinto Halim insertion Performed by: Audree Camel  Consent: Verbal consent obtained. Risks and benefits: risks, benefits and alternatives were discussed Time out: Immediately prior to procedure a "time out" was called to verify the correct patient, procedure, equipment, support staff and site/side marked as required.  Preparation: Patient was prepped and draped in the usual sterile fashion.  Vein Location: right basilic  Ultrasound Guided  Gauge: 20  Normal blood return and flush without difficulty Patient tolerance: Patient tolerated the procedure well with no immediate complications.     Pricilla Loveless, MD 11/16/20 [phone]

## 2020-11-16 NOTE — H&P (Addendum)
History and Physical    Kelli Hudson MOQ:[id number] DOB: 05/13/1990 DOA: 11/15/2020  PCP: [name], MD  Patient coming from: Home  I have personally briefly reviewed patient's old medical records in [place]  Chief Complaint: Gastroparesis  HPI: Kelli Hudson is a 30 y.o. female with medical history significant of DM1, HTN.  Pt with extensive h/o frequent admits to our service for diabetic gastroparesis, N/V.  Recently admitted earlier this week to [facility], left [facility] because they wernt "treating her symptoms correctly".  Per [facility] DC summary, pt was insisting on getting IV dilaudid.  Regardless, pt presents to ED with 4 day h/o intractable N/V, consistent with her diabetic gastroparesis.  ED Course: EDP gave zofran, dilaudid, protonix.   Review of Systems: As per HPI, otherwise all review of systems negative.  Past Medical History:  Diagnosis Date   Acute H. pylori gastric ulcer    Diabetes mellitus ([place])    Diabetic gastroparesis ([place])    Gastroparesis    GERD (gastroesophageal reflux disease)    Hypertension    Hyperthyroidism     Past Surgical History:  Procedure Laterality Date   AMPUTATION TOE Left 03/10/2018   Procedure: AMPUTATION FIFTH TOE;  Surgeon: Trula Slade, DPM;  Location: [place];  Service: Podiatry;  Laterality: Left;   BIOPSY  01/28/2020   Procedure: BIOPSY;  Surgeon: Otis Brace, MD;  Location: [facility];  Service: Gastroenterology;;   ESOPHAGOGASTRODUODENOSCOPY N/A 01/28/2020   Procedure: ESOPHAGOGASTRODUODENOSCOPY (EGD);  Surgeon: Otis Brace, MD;  Location: [facility];  Service: Gastroenterology;  Laterality: N/A;   ESOPHAGOGASTRODUODENOSCOPY (EGD) WITH PROPOFOL Left 09/08/2015   Procedure: ESOPHAGOGASTRODUODENOSCOPY (EGD) WITH PROPOFOL;  Surgeon: Arta Silence, MD;  Location: [facility];  Service: Endoscopy;  Laterality: Left;   WISDOM TOOTH EXTRACTION       reports  that she has never smoked. She has never used smokeless tobacco. She reports current alcohol use of about 1.0 standard drink per week. She reports that she does not use drugs.  No Known Allergies  Family History  Problem Relation Age of Onset   Lung cancer Mother    Diabetes Mother    Bipolar disorder Father    Liver cancer Maternal Grandfather    Breast cancer Other        maternal great aunt   Heart disease Other    Ovarian cancer Other        maternal great aunt   Pancreatic cancer Maternal Uncle    Prostate cancer Maternal Uncle    Diabetes Maternal Aunt        x 2   Kidney disease Other        maternal great aunt     Prior to Admission medications   Medication Sig Start Date End Date Taking? Authorizing Provider  albuterol (VENTOLIN HFA) 108 (90 Base) MCG/ACT inhaler Inhale 2 puffs into the lungs every 6 (six) hours as needed for shortness of breath.   Yes [provider]  hyoscyamine (LEVBID) 0.375 MG 12 hr tablet Take 1 tablet (0.375 mg total) by mouth every 12 (twelve) hours as needed for cramping (abdominal pain). 05/14/20  Yes Sheikh, Omair Latif, DO  promethazine (PHENERGAN) 12.5 MG tablet Take 1 tablet (12.5 mg total) by mouth every 6 (six) hours as needed for nausea or vomiting. 05/14/20  Yes Sheikh, Omair Latif, DO  atenolol (TENORMIN) 25 MG tablet Take 1 tablet (25 mg total) by mouth 2 (two) times daily. 01/30/20   Mariel Aloe,  MD  Blood Glucose Monitoring Suppl (TRUE METRIX METER) w/Device KIT 1 Device by Does not apply route 4 (four) times daily. 07/05/19   Libby Maw, MD  calcium gluconate 500 MG tablet Take 1 tablet (500 mg total) by mouth 3 (three) times daily. 09/24/20   [name], MD  Continuous Blood Gluc Receiver (DEXCOM G6 RECEIVER) DEVI 1 Device by Does not apply route daily. 09/24/20   [name], MD  Continuous Blood Gluc Sensor (DEXCOM G6 SENSOR) MISC 1 each by Does not apply route daily. 09/24/20   [name], MD   Continuous Blood Gluc Transmit (DEXCOM G6 TRANSMITTER) MISC 1 each by Does not apply route daily. 09/24/20   [name], MD  famotidine (PEPCID) 20 MG tablet Take 20 mg by mouth 2 (two) times daily. 09/06/20   [provider]  glucose blood (TRUE METRIX BLOOD GLUCOSE TEST) test strip Use as instructed 09/03/20   [name], MD  insulin glargine (LANTUS) 100 UNIT/ML injection Inject 0.18 mLs (18 Units total) into the skin daily. Patient taking differently: Inject 18 Units into the skin at bedtime. 08/15/20   Aline August, MD  Insulin Pen Needle 31G X 5 MM MISC 1 Device by Does not apply route QID. For use with insulin pens 07/05/19   Libby Maw, MD  insulin regular (NOVOLIN R) 100 units/mL injection Inject 0-0.15 mLs (0-15 Units total) into the skin 3 (three) times daily before meals. CBG 70 - 120: 0 units CBG 121 - 150: 2 units CBG 151 - 200: 3 units CBG 201 - 250: 5 units CBG 251 - 300: 8 units CBG 301 - 350: 11 units CBG 351 - 400: 15 units 05/14/20   Sheikh, Omair Latif, DO  lisinopril (ZESTRIL) 10 MG tablet Take 1 tablet (10 mg total) by mouth daily. 09/27/20   [name], MD  metoCLOPramide (REGLAN) 10 MG tablet Take 1 tablet (10 mg total) by mouth 3 (three) times daily before meals. 10/17/20   [name], MD  Multiple Vitamin (MULTIVITAMIN WITH MINERALS) TABS tablet Take 1 tablet by mouth daily.    [provider]  ondansetron (ZOFRAN ODT) 4 MG disintegrating tablet Take 1 tablet (4 mg total) by mouth every 6 (six) hours as needed for nausea or vomiting. Patient not taking: Reported on 11/16/2020 04/20/20   Esterwood, Amy S, PA-C  pantoprazole (PROTONIX) 40 MG tablet Take 40 mg by mouth daily. 09/17/20   [provider]  TRUEPLUS LANCETS 28G MISC assist with checking blood sugar TID and qhs 09/10/15   Zettie Pho S, PA-C  insulin aspart (NOVOLOG) 100 UNIT/ML FlexPen Inject 5 Units into the skin 3 (three) times daily with meals. 02/23/18  07/05/19  Donne Hazel, MD    Physical Exam: Vitals:   11/15/20 2330 11/15/20 2345 11/16/20 0245 11/16/20 0330  BP: (!) 153/99 (!) 171/111 123/76 126/79  Pulse:  (!) 115 (!) 108 (!) 107  Resp: _0 Temp:      TempSrc:      SpO2:  100% 98% 99%  Weight:      Height:        Constitutional: NAD, calm, comfortable Eyes: PERRL, lids and conjunctivae normal ENMT: Mucous membranes are moist. Posterior pharynx clear of any exudate or lesions.Normal dentition.  Neck: normal, supple, no masses, no thyromegaly Respiratory: clear to auscultation bilaterally, no wheezing, no crackles. Normal respiratory effort. No accessory muscle use.  Cardiovascular: Regular rate  and rhythm, no murmurs / rubs / gallops. No extremity edema. 2+ pedal pulses. No carotid bruits.  Abdomen: Mild epigastric TTP. Musculoskeletal: no clubbing / cyanosis. No joint deformity upper and lower extremities. Good ROM, no contractures. Normal muscle tone.  Skin: no rashes, lesions, ulcers. No induration Neurologic: CN 2-12 grossly intact. Sensation intact, DTR normal. Strength 5/5 in all 4.  Psychiatric: Normal judgment and insight. Alert and oriented x 3. Normal mood.    Labs on Admission: I have personally reviewed following labs and imaging studies  CBC: Recent Labs  Lab 11/15/20 2207  WBC 10.2  HGB 12.9  HCT 38.1  MCV 87.2  PLT 644   Basic Metabolic Panel: Recent Labs  Lab 11/12/20 1423 11/15/20 2207  NA  --  135  K  --  4.6  CL  --  98  CO2  --  24  GLUCOSE 328* 210*  BUN  --  23*  CREATININE  --  1.13*  CALCIUM  --  9.9   GFR: Estimated Creatinine Clearance: 80.8 mL/min (A) (by C-G formula based on SCr of 1.13 mg/dL (H)). Liver Function Tests: Recent Labs  Lab 11/15/20 2207  AST 24  ALT 21  ALKPHOS 94  BILITOT 1.1  PROT 8.5*  ALBUMIN 4.0   Recent Labs  Lab 11/15/20 2207  LIPASE 29   No results for input(s): AMMONIA in the last 168 hours. Coagulation Profile: No results  for input(s): INR, PROTIME in the last 168 hours. Cardiac Enzymes: No results for input(s): CKTOTAL, CKMB, CKMBINDEX, TROPONINI in the last 168 hours. BNP (last 3 results) No results for input(s): PROBNP in the last 8760 hours. HbA1C: No results for input(s): HGBA1C in the last 72 hours. CBG: No results for input(s): GLUCAP in the last 168 hours. Lipid Profile: No results for input(s): CHOL, HDL, LDLCALC, TRIG, CHOLHDL, LDLDIRECT in the last 72 hours. Thyroid Function Tests: No results for input(s): TSH, T4TOTAL, FREET4, T3FREE, THYROIDAB in the last 72 hours. Anemia Panel: No results for input(s): VITAMINB12, FOLATE, FERRITIN, TIBC, IRON, RETICCTPCT in the last 72 hours. Urine analysis:    Component Value Date/Time   COLORURINE YELLOW 11/16/2020 0355   APPEARANCEUR HAZY (A) 11/16/2020 0355   LABSPEC 1.034 (H) 11/16/2020 0355   PHURINE 5.0 11/16/2020 0355   GLUCOSEU 150 (A) 11/16/2020 0355   GLUCOSEU NEGATIVE 05/22/2020 1417   HGBUR NEGATIVE 11/16/2020 0355   BILIRUBINUR NEGATIVE 11/16/2020 0355   KETONESUR 20 (A) 11/16/2020 0355   PROTEINUR >=300 (A) 11/16/2020 0355   UROBILINOGEN 0.2 05/22/2020 1417   NITRITE NEGATIVE 11/16/2020 0355   LEUKOCYTESUR NEGATIVE 11/16/2020 0355    Radiological Exams on Admission: CT ABDOMEN PELVIS WO CONTRAST  Result Date: 11/15/2020 CLINICAL DATA:  Abdominal pain, evaluate for diverticulitis. EXAM: CT ABDOMEN AND PELVIS WITHOUT CONTRAST TECHNIQUE: Multidetector CT imaging of the abdomen and pelvis was performed following the standard protocol without IV contrast. COMPARISON:  CT abdomen and pelvis 06/05/2020 FINDINGS: Lower chest: No acute abnormality. Hepatobiliary: Gallbladder sludge is present. There is no biliary ductal dilatation. The liver appears within normal limits. Pancreas: Unremarkable. No pancreatic ductal dilatation or surrounding inflammatory changes. Spleen: Normal in size without focal abnormality. Adrenals/Urinary Tract: Adrenal  glands are unremarkable. Kidneys are normal, without renal calculi, focal lesion, or hydronephrosis. Bladder is unremarkable. Stomach/Bowel: Stomach is within normal limits. Appendix appears normal. No evidence of bowel wall thickening, distention, or inflammatory changes. Vascular/Lymphatic: No significant vascular findings are present. No enlarged abdominal or pelvic lymph nodes. Reproductive: Uterus  and bilateral adnexa are unremarkable. Other: No abdominal wall hernia or abnormality. No abdominopelvic ascites. Musculoskeletal: No acute or significant osseous findings. IMPRESSION: 1. Gallbladder sludge. 2. No other acute localizing process in the abdomen or pelvis. Electronically Signed   By: Ronney Asters M.D.   On: 11/15/2020 23:31   CT Angio Chest PE W and/or Wo Contrast  Result Date: 11/16/2020 CLINICAL DATA:  PE suspected, low/intermediate prob, positive D-dimer. Chest pain. EXAM: CT ANGIOGRAPHY CHEST WITH CONTRAST TECHNIQUE: Multidetector CT imaging of the chest was performed using the standard protocol during bolus administration of intravenous contrast. Multiplanar CT image reconstructions and MIPs were obtained to evaluate the vascular anatomy. CONTRAST:  67m OMNIPAQUE IOHEXOL 350 MG/ML SOLN COMPARISON:  08/22/2016, 10/03/2019 FINDINGS: Cardiovascular: Satisfactory opacification of the pulmonary arteries to the segmental level. No evidence of pulmonary embolism. Normal heart size. No pericardial effusion. Mediastinum/Nodes: No enlarged mediastinal, hilar, or axillary lymph nodes. Thyroid gland, trachea, and esophagus demonstrate no significant findings. Stable residual thymic tissue within the anterior mediastinum. Lungs/Pleura: Lungs are clear. No pleural effusion or pneumothorax. The central airways are widely patent. Upper Abdomen: No acute abnormality. Musculoskeletal: No chest wall abnormality. No acute or significant osseous findings. Review of the MIP images confirms the above findings.  IMPRESSION: No pulmonary embolism.  No acute intrathoracic pathology identified. Electronically Signed   By: AFidela SalisburyM.D.   On: 11/16/2020 03:23   DG Chest Portable 1 View  Result Date: 11/15/2020 CLINICAL DATA:  Upper abdominal and chest pain. EXAM: PORTABLE CHEST 1 VIEW COMPARISON:  CT abdomen and pelvis 11/15/2020. FINDINGS: The heart size and mediastinal contours are within normal limits. Both lungs are clear. The visualized skeletal structures are unremarkable. IMPRESSION: No active disease. Electronically Signed   By: ARonney AstersM.D.   On: 11/15/2020 23:42   UKoreaAbdomen Limited RUQ (LIVER/GB)  Result Date: 11/16/2020 CLINICAL DATA:  Abdominal pain EXAM: ULTRASOUND ABDOMEN LIMITED RIGHT UPPER QUADRANT COMPARISON:  None. FINDINGS: Gallbladder: A small amount of sludge is seen within the gallbladder lumen. The gallbladder common, is not distended, there is no gallbladder wall thickening or pericholecystic fluid, and the sonographic MPercell Millersign is reportedly negative. Common bile duct: Diameter: 4 mm in proximal diameter Liver: No focal lesion identified. Within normal limits in parenchymal echogenicity. Portal vein is patent on color Doppler imaging with normal direction of blood flow towards the liver. Other: None. IMPRESSION: Cholelithiasis without sonographic evidence of acute cholecystitis. Electronically Signed   By: AFidela SalisburyM.D.   On: 11/16/2020 00:48    EKG: Independently reviewed.  Assessment/Plan Principal Problem:   Diabetic gastroparesis associated with type 1 diabetes mellitus ([place] Active Problems:   Essential hypertension    Gastroparesis due to DM1 - IV reglan scheduled IV erythromycin scheduled NPO IVF Avoid narcotics PLEASE, these will: Worsen GI motility and thus further delay her gastric emptying. Potentially cause addiction in this patient.  I am already concerned for this given the discharge summary at [facility] from this week! I discussed this and our  reasoning behind this at length with patient. Toradol if needed for pain, has worked okay for her in the past (she acknowledges while pain control with toradol isnt ideal, it does take the edge off of the pain somewhat). Ativan if absolutely needed for breakthrough nausea. NGT if pain truly intractable DM1 - Lantus 12u QHS (first dose now) Sensitive SSI Q4H HTN - Cont home BP meds Mild trop elevation - Doubt ACS  DVT prophylaxis: Lovenox Code Status: Full  Family Communication: No family in room Disposition Plan: Home after taking POs again Consults called: None Admission status: Place in [place]     Franci Oshana, [place] Hospitalists  How to contact the [facility] Attending or Consulting provider [place] or covering provider during after hours [place], for this patient?  Check the care team in [facility] and look for a) attending/consulting [facility] provider listed and b) the [facility] team listed Log into [email or web address]  Amion Physician Scheduling and messaging for groups and whole hospitals  On call and physician scheduling software for group practices, residents, hospitalists and other medical providers for call, clinic, rotation and shift schedules. On[employer] is a hospital-wide system for scheduling doctors and paging doctors on call. EasyPlot is for scientific plotting and data analysis.  [email or web address]  and use [place]'s universal password to access. If you do not have the password, please contact the hospital operator.  Locate the [facility] provider you are looking for under [facility] and page to a number that you can be directly reached. If you still have difficulty reaching the provider, please page the [facility] (Director on Call) for the Hospitalists listed on amion for assistance.  11/16/2020, 4:39 AM

## 2020-11-16 NOTE — ED Notes (Signed)
Erythromycin sent back to pharmacy, precipitate formed when attempting to mix. Pharmacy will send a replacement bag.

## 2020-11-16 NOTE — Progress Notes (Signed)
PROGRESS NOTE  Brief Narrative: Kelli Hudson is a 30 y.o. female with a history of T2DM, HTN, gastroparesis who presented to the ED 9/29 with nausea, vomiting and abdominal pain typical of prior episodes requiring hospitalization. She was given zofran, protonix, and dilaudid in the ED with reglan and erythromycin ordered by Dr. Alcario Drought on admission this morning.   Subjective: Still in ED when I met her, resting quietly reporting that at that moment her pain had subsided. She's hungry, wants to start a diet. Has no nausea, no recent vomiting.   Objective: BP 94/63 (BP Location: Right Arm)   Pulse 92   Temp 99.4 F (37.4 C) (Oral)   Resp 14   Ht 5' 8"  (1.727 m)   Wt 74.1 kg   SpO2 100%   BMI 24.84 kg/m   Gen: Nontoxic 30yo F in no distress Pulm: Clear and nonlabored on room air  CV: RRR, no murmur, no JVD, no edema GI: Soft, NT, ND, +BS  Neuro: Alert and oriented. No focal deficits. Skin: No rashes, lesions or ulcers on limited visualization  Assessment & Plan: Principal Problem:   Diabetic gastroparesis associated with type 1 diabetes mellitus ([place]) Active Problems:   Essential hypertension   Diabetic gastroparesis (HCC)  Nausea, vomiting, abdominal pain due to diabetic gastroparesis: Symptoms refractory to management in ED and frequently require admission. Will continue with plan per Dr. Juleen China H&P this morning, specifically to:  - Give scheduled IV reglan and erythromycin to spur motility - IV fluids while she's unable to take adequate po.  - We can initiate clear liquid diet since her symptoms have abated, but will proceed cautiously.  - Analgesia with tylenol and/or toradol. Avoidance of narcotics is a hallmark of gastroparesis management.  T2DM:  - Lantus + sensitive SSI - Plans to establish care with new endocrinologist in November 2022.   HTN:  - Continue atenolol, lisinopril  Troponin elevation: Without anginal chest pain, ischemic ECG changes.  Elevation is mild and flat trend, not consistent with ACS.  - Defer risk stratification to outpatient setting unless anginal symptoms develop.  Patrecia Pour, MD Pager on amion 11/16/2020, 2:42 PM

## 2020-11-17 DIAGNOSIS — K3184 Gastroparesis: Secondary | ICD-10-CM | POA: Diagnosis not present

## 2020-11-17 DIAGNOSIS — I10 Essential (primary) hypertension: Secondary | ICD-10-CM | POA: Diagnosis not present

## 2020-11-17 DIAGNOSIS — E1043 Type 1 diabetes mellitus with diabetic autonomic (poly)neuropathy: Secondary | ICD-10-CM | POA: Diagnosis not present

## 2020-11-17 LAB — GLUCOSE, CAPILLARY
Glucose-Capillary: 110 mg/dL — ABNORMAL HIGH (ref 70–99)
Glucose-Capillary: 158 mg/dL — ABNORMAL HIGH (ref 70–99)
Glucose-Capillary: 74 mg/dL (ref 70–99)

## 2020-11-17 NOTE — Discharge Summary (Signed)
Physician Discharge Summary  Kelli Hudson ZOX:[id number] DOB: Jul 26, 1990 DOA: 11/15/2020  PCP: [name], MD  Admit date: 11/15/2020 Discharge date: 11/17/2020  Admitted From: Home Disposition: Home   Recommendations for Outpatient Follow-up:  Follow up with PCP in 1-2 weeks. Mild troponin elevation noted during intake in ED without angina or ischemic ECG changes, not consistent with ACS. Consider cardiology evaluation as outpatient. Follow up with endocrinology per routine Consider GI referral for gastric stimulator if symptoms remain refractory to medical management.  Home Health: None Equipment/Devices: None Discharge Condition: Stable CODE STATUS: Full Diet recommendation: Carb-modified  Brief/Interim Summary: Kelli Hudson is a 30 y.o. female with a history of T2DM, HTN, gastroparesis who presented to the ED 9/29 with nausea, vomiting and abdominal pain typical of prior episodes requiring hospitalization. She was given zofran, protonix, and dilaudid in the ED with reglan and erythromycin ordered on admission. Her symptoms resolved over the next 24 hours and the patient was discharged in stable condition having tolerated a diet.  Discharge Diagnoses:  Principal Problem:   Diabetic gastroparesis associated with type 1 diabetes mellitus ([place]) Active Problems:   Essential hypertension   Diabetic gastroparesis (HCC)  Nausea, vomiting, abdominal pain due to diabetic gastroparesis: Symptoms refractory to management in ED and frequently require admission.  - Symptoms resolved with IV medications, not requiring prn's x24 hours.  - Continue home scheduled reglan, consider erythromycin (was given IV while admitted) and/or referral to GI.   T2DM:  - No changes to home regimen. - Plans to establish care with new endocrinologist in November 2022.    HTN:  - Continue atenolol, lisinopril   Troponin elevation: Without anginal chest pain, ischemic ECG changes.  Elevation is mild and flat trend, not consistent with ACS.  - Defer risk stratification to outpatient setting unless anginal symptoms develop.  Discharge Instructions Discharge Instructions     Diet Carb Modified   Complete by: As directed    Discharge instructions   Complete by: As directed    - Continue taking home medications including reglan regularly - Seek medical attention if your symptoms return. Otherwise follow up with your PCP in the next 1-2 weeks.      Allergies as of 11/17/2020   No Known Allergies      Medication List     TAKE these medications    albuterol 108 (90 Base) MCG/ACT inhaler Commonly known as: VENTOLIN HFA Inhale 2 puffs into the lungs every 6 (six) hours as needed for shortness of breath.   atenolol 25 MG tablet Commonly known as: TENORMIN Take 1 tablet (25 mg total) by mouth 2 (two) times daily.   calcium gluconate 500 MG tablet Take 1 tablet (500 mg total) by mouth 3 (three) times daily.   Dexcom G6 Receiver Devi 1 Device by Does not apply route daily.   Dexcom G6 Sensor Misc 1 each by Does not apply route daily.   Dexcom G6 Transmitter Misc 1 each by Does not apply route daily.   famotidine 20 MG tablet Commonly known as: PEPCID Take 20 mg by mouth 2 (two) times daily.   hyoscyamine 0.375 MG 12 hr tablet Commonly known as: LEVBID Take 1 tablet (0.375 mg total) by mouth every 12 (twelve) hours as needed for cramping (abdominal pain).   insulin glargine 100 UNIT/ML injection Commonly known as: LANTUS Inject 0.18 mLs (18 Units total) into the skin daily. What changed: when to take this   Insulin Pen Needle 31G X 5 MM Misc 1  Device by Does not apply route QID. For use with insulin pens   insulin regular 100 units/mL injection Commonly known as: NovoLIN R Inject 0-0.15 mLs (0-15 Units total) into the skin 3 (three) times daily before meals. CBG 70 - 120: 0 units CBG 121 - 150: 2 units CBG 151 - 200: 3 units CBG 201 - 250: 5  units CBG 251 - 300: 8 units CBG 301 - 350: 11 units CBG 351 - 400: 15 units   lisinopril 10 MG tablet Commonly known as: ZESTRIL Take 1 tablet (10 mg total) by mouth daily.   metoCLOPramide 10 MG tablet Commonly known as: Reglan Take 1 tablet (10 mg total) by mouth 3 (three) times daily before meals.   multivitamin with minerals Tabs tablet Take 1 tablet by mouth daily.   ondansetron 4 MG disintegrating tablet Commonly known as: Zofran ODT Take 1 tablet (4 mg total) by mouth every 6 (six) hours as needed for nausea or vomiting.   pantoprazole 40 MG tablet Commonly known as: PROTONIX Take 40 mg by mouth daily.   promethazine 12.5 MG tablet Commonly known as: PHENERGAN Take 1 tablet (12.5 mg total) by mouth every 6 (six) hours as needed for nausea or vomiting.   True Metrix Blood Glucose Test test strip Generic drug: glucose blood Use as instructed   True Metrix Meter w/Device Kit 1 Device by Does not apply route 4 (four) times daily.   TRUEplus Lancets 28G Misc assist with checking blood sugar TID and qhs        Follow-up Information     [name], MD Follow up.   Specialty: Family Medicine Contact information: [place] [address] [phone]                No Known Allergies  Consultations: None  Procedures/Studies: CT ABDOMEN PELVIS WO CONTRAST  Result Date: 11/15/2020 CLINICAL DATA:  Abdominal pain, evaluate for diverticulitis. EXAM: CT ABDOMEN AND PELVIS WITHOUT CONTRAST TECHNIQUE: Multidetector CT imaging of the abdomen and pelvis was performed following the standard protocol without IV contrast. COMPARISON:  CT abdomen and pelvis 06/05/2020 FINDINGS: Lower chest: No acute abnormality. Hepatobiliary: Gallbladder sludge is present. There is no biliary ductal dilatation. The liver appears within normal limits. Pancreas: Unremarkable. No pancreatic ductal dilatation or surrounding inflammatory changes. Spleen:  Normal in size without focal abnormality. Adrenals/Urinary Tract: Adrenal glands are unremarkable. Kidneys are normal, without renal calculi, focal lesion, or hydronephrosis. Bladder is unremarkable. Stomach/Bowel: Stomach is within normal limits. Appendix appears normal. No evidence of bowel wall thickening, distention, or inflammatory changes. Vascular/Lymphatic: No significant vascular findings are present. No enlarged abdominal or pelvic lymph nodes. Reproductive: Uterus and bilateral adnexa are unremarkable. Other: No abdominal wall hernia or abnormality. No abdominopelvic ascites. Musculoskeletal: No acute or significant osseous findings. IMPRESSION: 1. Gallbladder sludge. 2. No other acute localizing process in the abdomen or pelvis. Electronically Signed   By: Ronney Asters M.D.   On: 11/15/2020 23:31   CT Angio Chest PE W and/or Wo Contrast  Result Date: 11/16/2020 CLINICAL DATA:  PE suspected, low/intermediate prob, positive D-dimer. Chest pain. EXAM: CT ANGIOGRAPHY CHEST WITH CONTRAST TECHNIQUE: Multidetector CT imaging of the chest was performed using the standard protocol during bolus administration of intravenous contrast. Multiplanar CT image reconstructions and MIPs were obtained to evaluate the vascular anatomy. CONTRAST:  15m OMNIPAQUE IOHEXOL 350 MG/ML SOLN COMPARISON:  08/22/2016, 10/03/2019 FINDINGS: Cardiovascular: Satisfactory opacification of the pulmonary arteries to the segmental level. No  evidence of pulmonary embolism. Normal heart size. No pericardial effusion. Mediastinum/Nodes: No enlarged mediastinal, hilar, or axillary lymph nodes. Thyroid gland, trachea, and esophagus demonstrate no significant findings. Stable residual thymic tissue within the anterior mediastinum. Lungs/Pleura: Lungs are clear. No pleural effusion or pneumothorax. The central airways are widely patent. Upper Abdomen: No acute abnormality. Musculoskeletal: No chest wall abnormality. No acute or significant  osseous findings. Review of the MIP images confirms the above findings. IMPRESSION: No pulmonary embolism.  No acute intrathoracic pathology identified. Electronically Signed   By: Fidela Salisbury M.D.   On: 11/16/2020 03:23   DG Chest Portable 1 View  Result Date: 11/15/2020 CLINICAL DATA:  Upper abdominal and chest pain. EXAM: PORTABLE CHEST 1 VIEW COMPARISON:  CT abdomen and pelvis 11/15/2020. FINDINGS: The heart size and mediastinal contours are within normal limits. Both lungs are clear. The visualized skeletal structures are unremarkable. IMPRESSION: No active disease. Electronically Signed   By: Ronney Asters M.D.   On: 11/15/2020 23:42   US Abdomen Limited RUQ (LIVER/GB)  Result Date: 11/16/2020 CLINICAL DATA:  Abdominal pain EXAM: ULTRASOUND ABDOMEN LIMITED RIGHT UPPER QUADRANT COMPARISON:  None. FINDINGS: Gallbladder: A small amount of sludge is seen within the gallbladder lumen. The gallbladder common, is not distended, there is no gallbladder wall thickening or pericholecystic fluid, and the sonographic Percell Miller sign is reportedly negative. Common bile duct: Diameter: 4 mm in proximal diameter Liver: No focal lesion identified. Within normal limits in parenchymal echogenicity. Portal vein is patent on color Doppler imaging with normal direction of blood flow towards the liver. Other: None. IMPRESSION: Cholelithiasis without sonographic evidence of acute cholecystitis. Electronically Signed   By: Fidela Salisbury M.D.   On: 11/16/2020 00:48     Subjective: Feels well, wants to go home. Has eaten full meals without abdominal pain. No nausea or vomiting this AM.  Discharge Exam: Vitals:   11/16/20 2315 11/17/20 0537  BP: 115/76 107/63  Pulse: 78 90  Resp:  18  Temp:  98.1 F (36.7 C)  SpO2:  99%   General: Pt is alert, awake, not in acute distress Cardiovascular: RRR, S1/S2 +, no rubs, no gallops Respiratory: CTA bilaterally, no wheezing, no rhonchi Abdominal: Soft, NT, ND, bowel  sounds + Extremities: No edema, no cyanosis  Labs: BNP (last 3 results) No results for input(s): BNP in the last 8760 hours. Basic Metabolic Panel: Recent Labs  Lab 11/12/20 1423 11/15/20 2207 11/16/20 0458  NA  --  135 135  K  --  4.6 4.3  CL  --  98 103  CO2  --  24 25  GLUCOSE 328* 210* 225*  BUN  --  23* 21*  CREATININE  --  1.13* 1.06*  CALCIUM  --  9.9 9.1   Liver Function Tests: Recent Labs  Lab 11/15/20 2207 11/16/20 0458  AST 24 17  ALT 21 16  ALKPHOS 94 67  BILITOT 1.1 0.8  PROT 8.5* 6.2*  ALBUMIN 4.0 2.9*   Recent Labs  Lab 11/15/20 2207  LIPASE 29   No results for input(s): AMMONIA in the last 168 hours. CBC: Recent Labs  Lab 11/15/20 2207 11/16/20 0458  WBC 10.2 9.8  HGB 12.9 10.0*  HCT 38.1 29.9*  MCV 87.2 88.2  PLT 398 310   Cardiac Enzymes: No results for input(s): CKTOTAL, CKMB, CKMBINDEX, TROPONINI in the last 168 hours. BNP: Invalid input(s): POCBNP CBG: Recent Labs  Lab 11/16/20 1554 11/16/20 2020 11/17/20 0009 11/17/20 0418 11/17/20 0746  GLUCAP  99 166* 74 158* 110*   D-Dimer Recent Labs    11/16/20 0040  DDIMER 0.90*   Hgb A1c No results for input(s): HGBA1C in the last 72 hours. Lipid Profile No results for input(s): CHOL, HDL, LDLCALC, TRIG, CHOLHDL, LDLDIRECT in the last 72 hours. Thyroid function studies No results for input(s): TSH, T4TOTAL, T3FREE, THYROIDAB in the last 72 hours.  Invalid input(s): FREET3 Anemia work up No results for input(s): VITAMINB12, FOLATE, FERRITIN, TIBC, IRON, RETICCTPCT in the last 72 hours. Urinalysis    Component Value Date/Time   COLORURINE YELLOW 11/16/2020 0355   APPEARANCEUR HAZY (A) 11/16/2020 0355   LABSPEC 1.034 (H) 11/16/2020 0355   PHURINE 5.0 11/16/2020 0355   GLUCOSEU 150 (A) 11/16/2020 0355   GLUCOSEU NEGATIVE 05/22/2020 1417   HGBUR NEGATIVE 11/16/2020 0355   BILIRUBINUR NEGATIVE 11/16/2020 0355   KETONESUR 20 (A) 11/16/2020 0355   PROTEINUR >=300 (A)  11/16/2020 0355   UROBILINOGEN 0.2 05/22/2020 1417   NITRITE NEGATIVE 11/16/2020 0355   LEUKOCYTESUR NEGATIVE 11/16/2020 0355    Microbiology Recent Results (from the past 240 hour(s))  Resp Panel by RT-PCR (Flu A&B, Covid) Nasopharyngeal Swab     Status: None   Collection Time: 11/16/20 12:40 AM   Specimen: Nasopharyngeal Swab; Nasopharyngeal(NP) swabs in vial transport medium  Result Value Ref Range Status   SARS Coronavirus 2 by RT PCR NEGATIVE NEGATIVE Final    Comment: (NOTE) SARS-CoV-2 target nucleic acids are NOT DETECTED.  The SARS-CoV-2 RNA is generally detectable in upper respiratory specimens during the acute phase of infection. The lowest concentration of SARS-CoV-2 viral copies this assay can detect is 138 copies/mL. A negative result does not preclude SARS-Cov-2 infection and should not be used as the sole basis for treatment or other patient management decisions. A negative result may occur with  improper specimen collection/handling, submission of specimen other than nasopharyngeal swab, presence of viral mutation(s) within the areas targeted by this assay, and inadequate number of viral copies(<138 copies/mL). A negative result must be combined with clinical observations, patient history, and epidemiological information. The expected result is Negative.  Fact Sheet for Patients:  EntrepreneurPulse.com.au  Fact Sheet for Healthcare Providers:  IncredibleEmployment.be  This test is no t yet approved or cleared by the [place] FDA and  has been authorized for detection and/or diagnosis of SARS-CoV-2 by FDA under an Emergency Use Authorization (EUA). This EUA will remain  in effect (meaning this test can be used) for the duration of the COVID-19 declaration under Section 564(b)(1) of the Act, 21 U.S.C.section 360bbb-3(b)(1), unless the authorization is terminated  or revoked sooner.       Influenza A by PCR NEGATIVE  NEGATIVE Final   Influenza B by PCR NEGATIVE NEGATIVE Final    Comment: (NOTE) The Xpert Xpress SARS-CoV-2/FLU/RSV plus assay is intended as an aid in the diagnosis of influenza from Nasopharyngeal swab specimens and should not be used as a sole basis for treatment. Nasal washings and aspirates are unacceptable for Xpert Xpress SARS-CoV-2/FLU/RSV testing.  Fact Sheet for Patients: EntrepreneurPulse.com.au  Fact Sheet for Healthcare Providers: IncredibleEmployment.be  This test is not yet approved or cleared by the [place] FDA and has been authorized for detection and/or diagnosis of SARS-CoV-2 by FDA under an Emergency Use Authorization (EUA). This EUA will remain in effect (meaning this test can be used) for the duration of the COVID-19 declaration under Section 564(b)(1) of the Act, 21 U.S.C. section 360bbb-3(b)(1), unless the authorization is terminated or revoked.  Performed at [facility], [place] [address], [place], [place] [address]     Time coordinating discharge: Approximately 40 minutes  Patrecia Pour, MD  [facility] 11/17/2020, 8:47 AM

## 2020-11-19 ENCOUNTER — Other Ambulatory Visit: Payer: Self-pay | Admitting: Family Medicine

## 2020-11-19 DIAGNOSIS — E0865 Diabetes mellitus due to underlying condition with hyperglycemia: Secondary | ICD-10-CM

## 2020-11-20 ENCOUNTER — Telehealth: Payer: Self-pay | Admitting: *Deleted

## 2020-11-20 NOTE — Telephone Encounter (Signed)
Transition Care Management Follow-up Telephone Call Date of discharge and from where: 11-17-2020 [facility] How have you been since you were released from the hospital? Patient states she is feeling much better Any questions or concerns? No  Items Reviewed: Did the pt receive and understand the discharge instructions provided? Yes  Medications obtained and verified?  Not applicable Other? No  Any new allergies since your discharge? No  Dietary orders reviewed? No Do you have support at home? Yes   Home Care and Equipment/Supplies: Were home health services ordered? not applicable If so, what is the name of the agency?   Has the agency set up a time to come to the patient's home? not applicable Were any new equipment or medical supplies ordered?  No What is the name of the medical supply agency?  Were you able to get the supplies/equipment? not applicable Do you have any questions related to the use of the equipment or supplies? No  Functional Questionnaire: (I = Independent and D = Dependent) ADLs: I  Bathing/Dressing- I  Meal Prep- I  Eating- I  Maintaining continence- I  Transferring/Ambulation- I  Managing Meds- I  Follow up appointments reviewed:  PCP Hospital f/u appt confirmed? No  Scheduled to see patient will call to schedule  Specialist Hospital f/u appt confirmed? No   Are transportation arrangements needed? No  If their condition worsens, is the pt aware to call PCP or go to the Emergency Dept.? Yes Was the patient provided with contact information for the PCP's office or ED? Yes Was to pt encouraged to call back with questions or concerns? Yes

## 2020-11-21 ENCOUNTER — Other Ambulatory Visit: Payer: Self-pay | Admitting: Family Medicine

## 2020-11-21 ENCOUNTER — Encounter: Payer: Self-pay | Admitting: Family Medicine

## 2020-11-21 DIAGNOSIS — E1165 Type 2 diabetes mellitus with hyperglycemia: Secondary | ICD-10-CM

## 2020-11-21 MED ORDER — DEXCOM G6 SENSOR MISC
1.0000 | Freq: Every day | 12 refills | Status: DC
Start: 2020-11-21 — End: 2021-10-04

## 2020-11-22 NOTE — Chronic Care Management (AMB) (Signed)
  Care Management   Note  11/22/2020 Name: Kelli Hudson MRN: [id number] DOB: 1990/02/25  Kelli Hudson is a 30 y.o. year old female who is a primary care patient of Loyola Mast, MD and is actively engaged with the care management team. I reached out to [employer] by phone today to assist with re-scheduling a follow up visit with the RN Case Manager  Follow up plan: Telephone appointment with care management team member scheduled for: 11/28/2020  [name], CCMA Care Guide, Embedded Care Coordination [facility]  Care Management  Direct Dial: [phone]

## 2020-11-28 ENCOUNTER — Ambulatory Visit: Payer: 59

## 2020-11-28 DIAGNOSIS — K3184 Gastroparesis: Secondary | ICD-10-CM

## 2020-11-28 DIAGNOSIS — E0865 Diabetes mellitus due to underlying condition with hyperglycemia: Secondary | ICD-10-CM

## 2020-11-28 NOTE — Patient Instructions (Signed)
Visit Information:  Thank you for taking the time to speak with me today.  PATIENT GOALS:  Goals Addressed             This Visit's Progress    Monitor and manage blood sugars and gastroparesis symptoms   Not on track    Timeframe:  Long-Range Goal Priority:  High Start Date:    06/13/2020                        Expected End Date:  02/15/2021                Follow Up Date 01/30/2021   -Contact your RN case manager when your nutritional supplements start running low for reorder.  -  Continue to check blood sugar at prescribed times and take your blood sugar log/ diary to your appointments.   - Continue to check blood sugar if I feel it is too high or too low -  Always stay hydrated: drink 6 to 8 glasses of water each day - continue to eat small, frequent meals,  Avoid raw and uncooked fruits and vegetables, fibrous fruits and vegetables (  - gentle exercise following meals, such as walking - Continue to follow up with your doctors as recommended.  - take your medications as prescribed and refill timely     Why is this important?   Checking your blood sugar at home helps to keep it from getting very high or very low.  Writing the results in a diary or log helps the doctor know how to care for you.  Your blood sugar log should have the time, date and the results.  Also, write down the amount of insulin or other medicine that you take.  Other information, like what you ate, exercise done and how you were feeling, will also be helpful.             Patient verbalizes understanding of instructions provided today and agrees to view in MyChart.   The patient has been provided with contact information for the care management team and has been advised to call with any health related questions or concerns.  The care management team will reach out to the patient again over the next 1-2 months   George Ina RN,BSN,CCM RN Case Manager [facility] [phone]

## 2020-11-28 NOTE — Chronic Care Management (AMB) (Signed)
Chronic Care Management   CCM RN Visit Note  11/28/2020 Name: Kelli Hudson MRN: [id number] DOB: 1990-10-05  Subjective: Kelli Hudson is a 30 y.o. year old female who is a primary care patient of Rudd, Lillette Boxer, MD. The care management team was consulted for assistance with disease management and care coordination needs.    Engaged with patient by telephone for follow up visit in response to provider referral for case management and/or care coordination services.   Consent to Services:  The patient was given information about Chronic Care Management services, agreed to services, and gave verbal consent prior to initiation of services.  Please see initial visit note for detailed documentation.   Patient agreed to services and verbal consent obtained.   Assessment: Review of patient past medical history, allergies, medications, health status, including review of consultants reports, laboratory and other test data, was performed as part of comprehensive evaluation and provision of chronic care management services.   SDOH (Social Determinants of Health) assessments and interventions performed:    CCM Care Plan  No Known Allergies  Outpatient Encounter Medications as of 11/28/2020  Medication Sig   albuterol (VENTOLIN HFA) 108 (90 Base) MCG/ACT inhaler Inhale 2 puffs into the lungs every 6 (six) hours as needed for shortness of breath.   atenolol (TENORMIN) 25 MG tablet Take 1 tablet (25 mg total) by mouth 2 (two) times daily.   Blood Glucose Monitoring Suppl (TRUE METRIX METER) w/Device KIT 1 Device by Does not apply route 4 (four) times daily.   calcium gluconate 500 MG tablet Take 1 tablet (500 mg total) by mouth 3 (three) times daily.   Continuous Blood Gluc Receiver (DEXCOM G6 RECEIVER) DEVI USE AS DIRECTED   Continuous Blood Gluc Sensor (DEXCOM G6 SENSOR) MISC 1 each by Does not apply route daily.   Continuous Blood Gluc Transmit (DEXCOM G6 TRANSMITTER) MISC 1 each  by Does not apply route daily.   famotidine (PEPCID) 20 MG tablet Take 20 mg by mouth 2 (two) times daily.   glucose blood (TRUE METRIX BLOOD GLUCOSE TEST) test strip Use as instructed   hyoscyamine (LEVBID) 0.375 MG 12 hr tablet Take 1 tablet (0.375 mg total) by mouth every 12 (twelve) hours as needed for cramping (abdominal pain).   insulin glargine (LANTUS) 100 UNIT/ML injection Inject 0.18 mLs (18 Units total) into the skin daily. (Patient taking differently: Inject 18 Units into the skin at bedtime.)   Insulin Pen Needle 31G X 5 MM MISC 1 Device by Does not apply route QID. For use with insulin pens   insulin regular (NOVOLIN R) 100 units/mL injection Inject 0-0.15 mLs (0-15 Units total) into the skin 3 (three) times daily before meals. CBG 70 - 120: 0 units CBG 121 - 150: 2 units CBG 151 - 200: 3 units CBG 201 - 250: 5 units CBG 251 - 300: 8 units CBG 301 - 350: 11 units CBG 351 - 400: 15 units   lisinopril (ZESTRIL) 10 MG tablet Take 1 tablet (10 mg total) by mouth daily.   metoCLOPramide (REGLAN) 10 MG tablet Take 1 tablet (10 mg total) by mouth 3 (three) times daily before meals.   Multiple Vitamin (MULTIVITAMIN WITH MINERALS) TABS tablet Take 1 tablet by mouth daily.   ondansetron (ZOFRAN ODT) 4 MG disintegrating tablet Take 1 tablet (4 mg total) by mouth every 6 (six) hours as needed for nausea or vomiting. (Patient not taking: Reported on 11/16/2020)   pantoprazole (PROTONIX) 40 MG tablet Take 40 mg  by mouth daily.   promethazine (PHENERGAN) 12.5 MG tablet Take 1 tablet (12.5 mg total) by mouth every 6 (six) hours as needed for nausea or vomiting.   TRUEPLUS LANCETS 28G MISC assist with checking blood sugar TID and qhs   [DISCONTINUED] insulin aspart (NOVOLOG) 100 UNIT/ML FlexPen Inject 5 Units into the skin 3 (three) times daily with meals.   No facility-administered encounter medications on file as of 11/28/2020.    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 ([place]) 11/09/2020   Diabetic retinopathy of both eyes associated with type 2 diabetes mellitus ([place]) 09/24/2020   Diabetic gastroparesis associated with type 1 diabetes mellitus ([place]) 08/12/2020   AKI (acute kidney injury) ([place]) 07/25/2020   GERD (gastroesophageal reflux disease) 07/23/2020   Diabetic nephropathy ([place]) 06/27/2020   Borderline hyperlipidemia 05/23/2020   Gastroparesis due to DM ([place]) 05/11/2020   Uncontrolled type 2 diabetes mellitus with gastroparesis 05/09/2020   Normocytic anemia 04/20/2020   Hyperosmolar hyperglycemic state (HHS) ([place]) 02/24/2020   Intractable nausea and vomiting    Intractable abdominal pain 01/24/2020   H. pylori infection 10/07/2019   Depression with anxiety 07/05/2019   Diabetic polyneuropathy associated with type 2 diabetes mellitus ([place]) 09/24/2017   UTI (urinary tract infection) 06/14/2011   Goiter 10/05/2009   Uncontrolled diabetes mellitus 10/05/2009   Essential hypertension 10/05/2009    Conditions to be addressed/monitored:DMII and Gastroparesis  Care Plan : Diabetes Type 2 (Adult)  Updates made by Dannielle Karvonen, RN since 11/28/2020 12:00 AM     Problem: Knowledge deficit related to long term self health management of diabetes with gastroparesis.   Priority: High     Long-Range Goal: Disease progression minimized   Start Date: 06/13/2020  Expected End Date: 02/15/2021  This Visit's Progress: Not on track  Recent Progress: On track  Priority: High  Note:   Objective:  Lab Results  Component Value Date   HGBA1C 10.5 (H) 09/09/2020   Lab Results  Component Value Date   CREATININE 1.05 09/24/2020   CREATININE 0.84 09/11/2020   CREATININE 0.96 09/09/2020  Current Barriers:  Knowledge Deficits related to basic Diabetes pathophysiology and self care/management:  Patient reports having hospital admission on 11/12/2020- 11/15/2020 and  11/12/2020 - 11/17/2020.  Patient states she is scheduled to follow up with Dr. Kelton Pillar on 12/18/2020.  She states they will talk further about her getting an insulin pump.  Patient  states she is to call the nutritionist to schedule follow up as well.   No change in treatment plan.  Patient states she has received the nutritional supplements and expresses her appreciation Case Manager Clinical Goal(s):  patient will demonstrate improved adherence to prescribed treatment plan for diabetes self care/management as evidenced by: daily monitoring and recording of CBG  adherence to ADA/ carb modified diet adherence to prescribed medication regimen contacting provider for new or worsened symptoms or questions Patient will schedule and attend appointment with endocrinologist Patient will report scheduling appointment with nutritionist Interventions:  Collaboration with Gena Fray Lillette Boxer, MD regarding development and update of comprehensive plan of care as evidenced by provider attestation and co-signature:   Inter-disciplinary care team collaboration (see longitudinal plan of care):  Reviewed medications with patient and discussed importance of medication adherence Discussed plans with patient for ongoing care management follow up and provided patient with direct contact information for care management team Reviewed scheduled/upcoming  provider appointments  Discussed  gastroparesis symptom management. Referral made to pharmacy team for medication assistance with Novolog Self-Care Activities - Self administers insulin as prescribed Attends all scheduled provider appointments Checks blood sugars as prescribed and utilize hyper and hypoglycemia protocol as needed Adheres to prescribed ADA/carb modified Patient Goals: -Contact your RN case manager when your nutritional supplements start running low for reorder.  -  Continue to check blood sugar at prescribed times and take your blood sugar log/ diary to your  appointments.   - Continue to check blood sugar if I feel it is too high or too low -  Always stay hydrated: drink 6 to 8 glasses of water each day - continue to eat small, frequent meals,  Avoid raw and uncooked fruits and vegetables, fibrous fruits and vegetables (  - gentle exercise following meals, such as walking - Continue to follow up with your doctors as recommended.  - take your medications as prescribed and refill timely   Follow Up Plan: The patient has been provided with contact information for the care management team and has been advised to call with any health related questions or concerns.  The care management team will reach out to the patient again over the next 1-2 months .     Plan:The patient has been provided with contact information for the care management team and has been advised to call with any health related questions or concerns.  and The care management team will reach out to the patient again over the next 1-2 months  Quinn Plowman RN,BSN,CCM RN Case Manager [place]  [phone]

## 2020-12-04 ENCOUNTER — Other Ambulatory Visit: Payer: Self-pay

## 2020-12-04 ENCOUNTER — Ambulatory Visit (INDEPENDENT_AMBULATORY_CARE_PROVIDER_SITE_OTHER): Payer: 59 | Admitting: Podiatry

## 2020-12-04 ENCOUNTER — Encounter: Payer: Self-pay | Admitting: Podiatry

## 2020-12-04 ENCOUNTER — Ambulatory Visit (INDEPENDENT_AMBULATORY_CARE_PROVIDER_SITE_OTHER): Payer: 59

## 2020-12-04 VITALS — Temp 97.7°F

## 2020-12-04 DIAGNOSIS — L03032 Cellulitis of left toe: Secondary | ICD-10-CM

## 2020-12-04 DIAGNOSIS — M869 Osteomyelitis, unspecified: Secondary | ICD-10-CM | POA: Diagnosis not present

## 2020-12-04 DIAGNOSIS — M2042 Other hammer toe(s) (acquired), left foot: Secondary | ICD-10-CM | POA: Diagnosis not present

## 2020-12-04 DIAGNOSIS — L02612 Cutaneous abscess of left foot: Secondary | ICD-10-CM

## 2020-12-04 MED ORDER — MUPIROCIN 2 % EX OINT: 1.0000 "application " | TOPICAL_OINTMENT | Freq: Two times a day (BID) | CUTANEOUS | 2 refills | Status: DC

## 2020-12-04 MED ORDER — DOXYCYCLINE HYCLATE 100 MG PO TABS: 100.0000 mg | ORAL_TABLET | Freq: Two times a day (BID) | ORAL | 0 refills | Status: DC

## 2020-12-04 NOTE — Progress Notes (Signed)
Subjective: 30 year old female presents the office today with concerns of a infection to her left fourth toe.  She states that a few days ago she had an area that popped.  She has had an open wound.  She has some swelling to the toe no current purulence.  Some occasional bleeding.  She keeps a cup with a Band-Aid.  No pain although she has neuropathy.  She has had a history of a wound in the fourth toe previously.   Objective: AAO x3, NAD DP/PT pulses palpable bilaterally, CRT less than 3 seconds Sensation decreased with Semmes Weinstein monofilament.   Previous partial fifth ray amputation.  On the left fourth toe there is localized edema present to the toe and there is agreement wound present on the PIPJ.  There is no probing, undermining or tunneling.  There is no significant erythema.  No fluctuance or crepitation.  There is no odor.  No pain with calf compression, swelling, warmth, erythema  Assessment: Left fourth toe ulceration with infection, uncontrolled diabetes  Plan: -All treatment options discussed with the patient including all alternatives, risks, complications.  -X-rays obtained reviewed.  There is some cortical changes present along the proximal phalanx.  I discussed with her this could be some chronic changes versus new. -The wound base is granular.  Given mupirocin ointment dressing changes for now.  Prescribe doxycycline.  Surgical shoe dispensed.  Order blood work including CBC, BMP, sed rate, CRP.  Discussed with her that if there is infection of the toe possible amputation.  We will see her back in about a week at that point repeat x-rays.  There is any worsening signs or symptoms of infection report to the emergency department.  Vivi Barrack DPM

## 2020-12-05 ENCOUNTER — Encounter: Payer: Self-pay | Admitting: Podiatry

## 2020-12-05 LAB — BASIC METABOLIC PANEL
BUN/Creatinine Ratio: 22 (calc) (ref 6–22)
BUN: 24 mg/dL (ref 7–25)
CO2: 28 mmol/L (ref 20–32)
Calcium: 9.8 mg/dL (ref 8.6–10.2)
Chloride: 100 mmol/L (ref 98–110)
Creat: 1.08 mg/dL — ABNORMAL HIGH (ref 0.50–0.96)
Glucose, Bld: 303 mg/dL — ABNORMAL HIGH (ref 65–99)
Potassium: 4.9 mmol/L (ref 3.5–5.3)
Sodium: 136 mmol/L (ref 135–146)

## 2020-12-05 LAB — CBC WITH DIFFERENTIAL/PLATELET
Absolute Monocytes: 348 cells/uL (ref 200–950)
Basophils Absolute: 29 cells/uL (ref 0–200)
Basophils Relative: 0.5 %
Eosinophils Absolute: 91 cells/uL (ref 15–500)
Eosinophils Relative: 1.6 %
HCT: 35.3 % (ref 35.0–45.0)
Hemoglobin: 11.4 g/dL — ABNORMAL LOW (ref 11.7–15.5)
Lymphs Abs: 2519 cells/uL (ref 850–3900)
MCH: 29 pg (ref 27.0–33.0)
MCHC: 32.3 g/dL (ref 32.0–36.0)
MCV: 89.8 fL (ref 80.0–100.0)
MPV: 9.8 fL (ref 7.5–12.5)
Monocytes Relative: 6.1 %
Neutro Abs: 2713 cells/uL (ref 1500–7800)
Neutrophils Relative %: 47.6 %
Platelets: 385 10*3/uL (ref 140–400)
RBC: 3.93 10*6/uL (ref 3.80–5.10)
RDW: 12.4 % (ref 11.0–15.0)
Total Lymphocyte: 44.2 %
WBC: 5.7 10*3/uL (ref 3.8–10.8)

## 2020-12-05 LAB — SEDIMENTATION RATE: Sed Rate: 53 mm/h — ABNORMAL HIGH (ref 0–20)

## 2020-12-05 LAB — C-REACTIVE PROTEIN: CRP: 1 mg/L (ref <8.0)

## 2020-12-17 ENCOUNTER — Other Ambulatory Visit: Payer: Self-pay

## 2020-12-17 ENCOUNTER — Ambulatory Visit (INDEPENDENT_AMBULATORY_CARE_PROVIDER_SITE_OTHER): Payer: 59

## 2020-12-17 ENCOUNTER — Ambulatory Visit (INDEPENDENT_AMBULATORY_CARE_PROVIDER_SITE_OTHER): Payer: 59 | Admitting: Podiatry

## 2020-12-17 DIAGNOSIS — L97501 Non-pressure chronic ulcer of other part of unspecified foot limited to breakdown of skin: Secondary | ICD-10-CM | POA: Diagnosis not present

## 2020-12-17 DIAGNOSIS — M869 Osteomyelitis, unspecified: Secondary | ICD-10-CM | POA: Diagnosis not present

## 2020-12-17 DIAGNOSIS — E08621 Diabetes mellitus due to underlying condition with foot ulcer: Secondary | ICD-10-CM | POA: Diagnosis not present

## 2020-12-17 LAB — CBC WITH DIFFERENTIAL/PLATELET
Absolute Monocytes: 505 cells/uL (ref 200–950)
Basophils Absolute: 29 cells/uL (ref 0–200)
Basophils Relative: 0.5 %
Eosinophils Absolute: 139 cells/uL (ref 15–500)
Eosinophils Relative: 2.4 %
HCT: 34.8 % — ABNORMAL LOW (ref 35.0–45.0)
Hemoglobin: 11.3 g/dL — ABNORMAL LOW (ref 11.7–15.5)
Lymphs Abs: 2645 cells/uL (ref 850–3900)
MCH: 29.2 pg (ref 27.0–33.0)
MCHC: 32.5 g/dL (ref 32.0–36.0)
MCV: 89.9 fL (ref 80.0–100.0)
MPV: 10 fL (ref 7.5–12.5)
Monocytes Relative: 8.7 %
Neutro Abs: 2482 cells/uL (ref 1500–7800)
Neutrophils Relative %: 42.8 %
Platelets: 385 10*3/uL (ref 140–400)
RBC: 3.87 10*6/uL (ref 3.80–5.10)
RDW: 12.2 % (ref 11.0–15.0)
Total Lymphocyte: 45.6 %
WBC: 5.8 10*3/uL (ref 3.8–10.8)

## 2020-12-17 LAB — SEDIMENTATION RATE: Sed Rate: 43 mm/h — ABNORMAL HIGH (ref 0–20)

## 2020-12-17 MED ORDER — DOXYCYCLINE HYCLATE 100 MG PO TABS: 100.0000 mg | ORAL_TABLET | Freq: Two times a day (BID) | ORAL | 0 refills | Status: DC

## 2020-12-18 ENCOUNTER — Encounter: Payer: Self-pay | Admitting: Internal Medicine

## 2020-12-18 ENCOUNTER — Ambulatory Visit (INDEPENDENT_AMBULATORY_CARE_PROVIDER_SITE_OTHER): Payer: 59 | Admitting: Internal Medicine

## 2020-12-18 VITALS — BP 110/70 | HR 97 | Ht 68.0 in | Wt 178.6 lb

## 2020-12-18 DIAGNOSIS — E1042 Type 1 diabetes mellitus with diabetic polyneuropathy: Secondary | ICD-10-CM | POA: Insufficient documentation

## 2020-12-18 DIAGNOSIS — E103593 Type 1 diabetes mellitus with proliferative diabetic retinopathy without macular edema, bilateral: Secondary | ICD-10-CM

## 2020-12-18 DIAGNOSIS — E1069 Type 1 diabetes mellitus with other specified complication: Secondary | ICD-10-CM | POA: Diagnosis not present

## 2020-12-18 DIAGNOSIS — E109 Type 1 diabetes mellitus without complications: Secondary | ICD-10-CM | POA: Insufficient documentation

## 2020-12-18 DIAGNOSIS — E1065 Type 1 diabetes mellitus with hyperglycemia: Secondary | ICD-10-CM | POA: Diagnosis not present

## 2020-12-18 NOTE — Patient Instructions (Addendum)
-   Lantus 16 units daily  - Novolin - R 1 units for every 12 grams of carbohydrates  -Novolin-R correctional insulin: ADD extra units on insulin to your meal-time Novolin dose if your blood sugars are higher than 180. Use the scale below to help guide you:   Blood sugar before meal Number of units to inject  Less than 180 0 unit  181 -  230 1 units  231 -  280 2 units  281 -  330 3 units  331 -  380 4 units  381 -  430 5 units  431 -  480 6 units  481 -  530 7 units  531 -  580 8 units   Check out Tandem/T-slim pump and Omnipod 5 pump   HOW TO TREAT LOW BLOOD SUGARS (Blood sugar LESS THAN 70 MG/DL) Please follow the RULE OF 15 for the treatment of hypoglycemia treatment (when your (blood sugars are less than 70 mg/dL)   STEP 1: Take 15 grams of carbohydrates when your blood sugar is low, which includes:  3-4 GLUCOSE TABS  OR 3-4 OZ OF JUICE OR REGULAR SODA OR ONE TUBE OF GLUCOSE GEL    STEP 2: RECHECK blood sugar in 15 MINUTES STEP 3: If your blood sugar is still low at the 15 minute recheck --> then, go back to STEP 1 and treat AGAIN with another 15 grams of carbohydrates.

## 2020-12-18 NOTE — Progress Notes (Addendum)
Name: [name]  MRN/ DOB: [id number], 1990-10-08   Age/ Sex: 30 y.o., female    PCP: [name], MD   Reason for Endocrinology Evaluation: Type 1 Diabetes Mellitus     Date of Initial Endocrinology Visit: 12/18/2020     PATIENT IDENTIFIER: Kelli Hudson is a 30 y.o. female with a past medical history of T1DM, . The patient presented for initial endocrinology clinic visit on 12/18/2020 for consultative assistance with her diabetes management.     HPI: Kelli Hudson was    Diagnosed with DM 2008 ( age 12)  Currently checking blood sugars multiple x / day, through CGM.  Hypoglycemia episodes : yes               Symptoms: yes             Hemoglobin A1c has ranged from 9.7% in 2017, peaking at 13.9% in 2021. Patient required assistance for hypoglycemia:  Patient has required hospitalization within the last 1 year from hyper or hypoglycemia: 10/2020  In terms of diet, the patient eats 2 meals a day, snacks  once  Has depression and anxiety   Sees Dr. Jacqualyn Posey podiatry  Denies nausea, vomiting or diarrhea    ON Nexplanon   HOME DIABETES REGIMEN: Lantus 15 units QHS  Novolin-R ( Vial) 1: 15     Statin: no ACE-I/ARB: yes Prior Diabetic Education: yes    CONTINUOUS GLUCOSE MONITORING RECORD INTERPRETATION    Dates of Recording: 10/19-11/02/2020  Sensor description:dexcom  Results statistics:   CGM use % of time 29  Average and SD 281/81  Time in range      14  %  % Time Above 180 20  % Time above 250 66  % Time Below target 0    Glycemic patterns summary: Hyperglycemia during the day and night   Hyperglycemic episodes  all day and night   Hypoglycemic episodes occurred following a bolus   Overnight periods: high        DIABETIC COMPLICATIONS: Microvascular complications:  Gastroparesis , neuropathy , has DR ( Received injections B/L).  S/P 5th toe amputations  Last eye exam: Completed 09/2020  Macrovascular  complications:   Denies: CAD, PVD, CVA   PAST HISTORY: Past Medical History:  Past Medical History:  Diagnosis Date   Acute H. pylori gastric ulcer    Diabetes mellitus ([place])    Diabetic gastroparesis ([place])    Gastroparesis    GERD (gastroesophageal reflux disease)    Hypertension    Hyperthyroidism    Past Surgical History:  Past Surgical History:  Procedure Laterality Date   AMPUTATION TOE Left 03/10/2018   Procedure: AMPUTATION FIFTH TOE;  Surgeon: Trula Slade, DPM;  Location: [facility];  Service: Podiatry;  Laterality: Left;   BIOPSY  01/28/2020   Procedure: BIOPSY;  Surgeon: Otis Brace, MD;  Location: [facility];  Service: Gastroenterology;;   ESOPHAGOGASTRODUODENOSCOPY N/A 01/28/2020   Procedure: ESOPHAGOGASTRODUODENOSCOPY (EGD);  Surgeon: Otis Brace, MD;  Location: [facility];  Service: Gastroenterology;  Laterality: N/A;   ESOPHAGOGASTRODUODENOSCOPY (EGD) WITH PROPOFOL Left 09/08/2015   Procedure: ESOPHAGOGASTRODUODENOSCOPY (EGD) WITH PROPOFOL;  Surgeon: Arta Silence, MD;  Location: [facility];  Service: Endoscopy;  Laterality: Left;   WISDOM TOOTH EXTRACTION      Social History:  reports that she has never smoked. She has never used smokeless tobacco. She reports current alcohol use of about 1.0 standard drink per week. She reports that she does not use drugs. Family  History:  Family History  Problem Relation Age of Onset   Lung cancer Mother    Diabetes Mother    Bipolar disorder Father    Liver cancer Maternal Grandfather    Breast cancer Other        maternal great aunt   Heart disease Other    Ovarian cancer Other        maternal great aunt   Pancreatic cancer Maternal Uncle    Prostate cancer Maternal Uncle    Diabetes Maternal Aunt        x 2   Kidney disease Other        maternal great aunt     HOME MEDICATIONS: Allergies as of 12/18/2020   No Known Allergies      Medication List        Accurate as  of December 18, 2020 10:36 AM. If you have any questions, ask your nurse or doctor.          STOP taking these medications    erythromycin 250 MG tablet Commonly known as: E-MYCIN Stopped by: Dorita Sciara, MD   ondansetron 4 MG disintegrating tablet Commonly known as: Zofran ODT Stopped by: Dorita Sciara, MD       TAKE these medications    albuterol 108 (90 Base) MCG/ACT inhaler Commonly known as: VENTOLIN HFA Inhale 2 puffs into the lungs every 6 (six) hours as needed for shortness of breath.   atenolol 25 MG tablet Commonly known as: TENORMIN Take 1 tablet (25 mg total) by mouth 2 (two) times daily.   calcium gluconate 500 MG tablet Take 1 tablet (500 mg total) by mouth 3 (three) times daily.   Dexcom G6 Receiver Devi USE AS DIRECTED   Dexcom G6 Sensor Misc 1 each by Does not apply route daily.   Dexcom G6 Transmitter Misc 1 each by Does not apply route daily.   doxycycline 100 MG tablet Commonly known as: VIBRA-TABS Take 1 tablet (100 mg total) by mouth 2 (two) times daily.   famotidine 20 MG tablet Commonly known as: PEPCID Take 20 mg by mouth 2 (two) times daily.   hyoscyamine 0.375 MG 12 hr tablet Commonly known as: LEVBID Take 1 tablet (0.375 mg total) by mouth every 12 (twelve) hours as needed for cramping (abdominal pain).   insulin glargine 100 UNIT/ML injection Commonly known as: LANTUS Inject 0.18 mLs (18 Units total) into the skin daily. What changed: when to take this   Insulin Pen Needle 31G X 5 MM Misc 1 Device by Does not apply route QID. For use with insulin pens   insulin regular 100 units/mL injection Commonly known as: NovoLIN R Inject 0-0.15 mLs (0-15 Units total) into the skin 3 (three) times daily before meals. CBG 70 - 120: 0 units CBG 121 - 150: 2 units CBG 151 - 200: 3 units CBG 201 - 250: 5 units CBG 251 - 300: 8 units CBG 301 - 350: 11 units CBG 351 - 400: 15 units   lisinopril 10 MG tablet Commonly  known as: ZESTRIL Take 1 tablet (10 mg total) by mouth daily.   metoCLOPramide 10 MG tablet Commonly known as: Reglan Take 1 tablet (10 mg total) by mouth 3 (three) times daily before meals.   multivitamin with minerals Tabs tablet Take 1 tablet by mouth daily.   mupirocin ointment 2 % Commonly known as: BACTROBAN Apply 1 application topically 2 (two) times daily.   pantoprazole 40 MG tablet Commonly known as:  PROTONIX Take 40 mg by mouth daily.   promethazine 12.5 MG tablet Commonly known as: PHENERGAN Take 1 tablet (12.5 mg total) by mouth every 6 (six) hours as needed for nausea or vomiting.   True Metrix Blood Glucose Test test strip Generic drug: glucose blood Use as instructed   True Metrix Meter w/Device Kit 1 Device by Does not apply route 4 (four) times daily.   TRUEplus Lancets 28G Misc assist with checking blood sugar TID and qhs         ALLERGIES: No Known Allergies   REVIEW OF SYSTEMS: A comprehensive ROS was conducted with the patient and is negative except as per HPI     OBJECTIVE:   VITAL SIGNS: BP 110/70 (BP Location: Left Arm, Patient Position: Sitting, Cuff Size: Small)   Pulse 97   Ht 5' 8"  (1.727 m)   Wt 178 lb 9.6 oz (81 kg)   SpO2 98%   BMI 27.16 kg/m    PHYSICAL EXAM:  General: Pt appears well and is in NAD  Neck: General: Supple without adenopathy or carotid bruits. Thyroid: Thyroid size normal.  No goiter or nodules appreciated.  Lungs: Clear with good BS bilat with no rales, rhonchi, or wheezes  Heart: RRR with normal S1 and S2 and no gallops; no murmurs; no rub  Abdomen: Normoactive bowel sounds, soft, nontender, without masses or organomegaly palpable  Extremities:  Lower extremities - No pretibial edema. No lesions.  Neuro: MS is good with appropriate affect, pt is alert and Ox3    DATA REVIEWED:  Lab Results  Component Value Date   HGBA1C 9.8 (H) 11/12/2020   HGBA1C 10.5 (H) 09/09/2020   HGBA1C 10.2 (H) 06/27/2020    Lab Results  Component Value Date   MICROALBUR 48.4 (H) 05/22/2020   LDLCALC 132 (H) 05/22/2020   CREATININE 1.08 (H) 12/04/2020   Lab Results  Component Value Date   MICRALBCREAT 66.9 (H) 05/22/2020    Lab Results  Component Value Date   CHOL 231 (H) 05/22/2020   HDL 76.30 05/22/2020   LDLCALC 132 (H) 05/22/2020   LDLDIRECT 83.0 07/05/2019   TRIG 113.0 05/22/2020   CHOLHDL 3 05/22/2020        ASSESSMENT / PLAN / RECOMMENDATIONS:   1) Type 1 Diabetes Mellitus, Poorly controlled, With retinopathic, neuropathic complications and gastroparesis  - Most recent A1c of 9.8 %. Goal A1c < 7.0 %.     I have discussed with the patient the pathophysiology of diabetes. We went over the natural progression of the disease. We stressed the importance of lifestyle changes including diet and exercise. I explained the complications associated with diabetes including retinopathy, nephropathy, neuropathy as well as increased risk of cardiovascular disease. We went over the benefit seen with glycemic control.   I explained to the patient that diabetic patients are at higher than normal risk for amputations.  Unfortunately she already has microvascular complications, we discussed it is still important to slow down any further damage She is on Novolin-R vials, I offered to switch to pens but she just picked up multiple and would like to use first  She is interested in insulin pumps , she is changing insurance, and will order once the new insurance kicks in.  She meets with [name]  Due to gastroparesis she takes insulin post-prandial, it will be difficult to reach optimal glycemic control  Will adjust insulin as below   MEDICATIONS: - Lantus 16 units daily  - Novolin - R 1  units for every 12 grams of carbohydrates  Correction Factor: Regular insulin (BG-130/50)   EDUCATION / INSTRUCTIONS: BG monitoring instructions: Patient is instructed to check her blood sugars 3 times a day, before  meals . Call [facility] if: BG persistently < 70  I reviewed the Rule of 15 for the treatment of hypoglycemia in detail with the patient. Literature supplied.   2) Diabetic complications:  Eye: Does  have known diabetic retinopathy.  Neuro/ Feet: Does  have known diabetic peripheral neuropathy. Renal: Patient does not have known baseline CKD. She is  on an ACEI/ARB at present   3) Lipids: LDL is acceptable. NO indication for statin therapy until age 31-40     F/U in 3 months    Signed electronically by: Mack Guise, MD  [facility] End[facility]  [facility] [address] [place], [place] [address] Phone: [phone] FAX: [phone]   CC: [name], [place] [address] Phone: [phone]  Fax: [phone]    Return to Endocrinology clinic as below: Future Appointments  Date Time Provider [place]  12/25/2020 10:45 AM Trula Slade, DPM [facility]  01/30/2021  2:00 PM [facility]  02/07/2021 10:30 AM [name], MD [facility]

## 2020-12-19 NOTE — Progress Notes (Signed)
Subjective: 30 year old female presents the office today for follow-up evaluation of wound, infection to her left fourth toe.  She states on Friday her foot did swell but did come down.  She is finished the course of antibiotics.  Currently denies any drainage or pus.  She has no fevers or chills.  She has no other concerns.  She has had a history of a wound in the fourth toe previously.  Objective: AAO x3, NAD DP/PT pulses palpable bilaterally, CRT less than 3 seconds Sensation decreased with Semmes Weinstein monofilament.   Previous partial fifth ray amputation.  Left fourth toe with superficial granular wound present dorsal lateral aspect.  There is localized edema to the toe but no significant erythema or warmth.  No significant edema to the foot.  No ascending cellulitis.  No fluctuation crepitation there is no malodor.  Hammertoes are present. No pain with calf compression, swelling, warmth, erythema  Assessment: Left fourth toe ulceration with infection, uncontrolled diabetes  Plan: -All treatment options discussed with the patient including all alternatives, risks, complications.  -Repeat x-rays obtained reviewed.  Chronic changes present of the proximal phalanx.  Difficult to fully evaluate for osteomyelitis given the contracture of the digit but no definitive changes noted. -Debrided the wound today without any complications.  Recommended continue antibiotic ointment dressing changes for now.  Continue antibiotics for concern of possible osteomyelitis although not fully noted on x-ray.  Monitor closely for any signs or symptoms of worsening infection report to the emergency room or call the office Millie should any occur.  Discussed possible need for amputation of the toe.  We will see her back next week.  Recheck sed rate, CBC.   Repeat x-ray Vivi Barrack DPM

## 2020-12-25 ENCOUNTER — Ambulatory Visit (INDEPENDENT_AMBULATORY_CARE_PROVIDER_SITE_OTHER): Payer: 59

## 2020-12-25 ENCOUNTER — Ambulatory Visit (INDEPENDENT_AMBULATORY_CARE_PROVIDER_SITE_OTHER): Payer: 59 | Admitting: Podiatry

## 2020-12-25 ENCOUNTER — Other Ambulatory Visit: Payer: Self-pay

## 2020-12-25 DIAGNOSIS — E08621 Diabetes mellitus due to underlying condition with foot ulcer: Secondary | ICD-10-CM

## 2020-12-25 DIAGNOSIS — L97501 Non-pressure chronic ulcer of other part of unspecified foot limited to breakdown of skin: Secondary | ICD-10-CM | POA: Diagnosis not present

## 2020-12-25 DIAGNOSIS — M869 Osteomyelitis, unspecified: Secondary | ICD-10-CM

## 2020-12-25 MED ORDER — AMOXICILLIN-POT CLAVULANATE 875-125 MG PO TABS: 1.0000 | ORAL_TABLET | Freq: Two times a day (BID) | ORAL | 0 refills | Status: DC

## 2020-12-25 NOTE — Progress Notes (Signed)
Subjective: 30 year old female presents the office today for follow-up evaluation of wound, infection to her left fourth toe.  She states that she still been having some swelling to her foot and she has a throbbing sensation.  She denies any drainage or pus coming from the wound.  She denies any fevers or chills.  She has no other concerns today.   She has had a history of a wound in the fourth toe previously.  Objective: AAO x3, NAD DP/PT pulses palpable bilaterally, CRT less than 3 seconds Sensation decreased with Semmes Weinstein monofilament.   Previous partial fifth ray amputation.  Superficial ulceration with granular base present to the dorsal lateral aspect of the left fourth toe.  There is edema still present toe.  There is mild edema present to the foot.  No erythema to the foot.  There is no fluctuation crepitation.  There is no malodor. No pain with calf compression, swelling, warmth, erythema  Assessment: Left fourth toe ulceration with infection/osteomyelitis, uncontrolled diabetes  Plan: -All treatment options discussed with the patient including all alternatives, risks, complications.  -Repeat x-rays obtained reviewed.  Changes noted to the proximal phalanx consistent with osteomyelitis.  Also clinically her exam was considered osteomyelitis given the continued edema present to the toe as well as the wound that is present overlying the area. -I long discussion with the patient as well as her mom who was on the phone today in regards to treatment options.  Discussed try to salvage the toe versus amputation.  After discussion the patient wants to proceed with amputation of the digit.  Discussed her left fourth toe rotation.  We will plan on doing the surgery later this week. -The incision placement as well as the postoperative course was discussed with the patient. I discussed risks of the surgery which include, but not limited to, infection, bleeding, pain, swelling, need for  further surgery, delayed or nonhealing, painful or ugly scar, numbness or sensation changes, over/under correction, recurrence, transfer lesions, further deformity, DVT/PE, loss of toe/foot. Patient understands these risks and wishes to proceed with surgery. The surgical consent was reviewed with the patient all 3 pages were signed. No promises or guarantees were given to the outcome of the procedure. All questions were answered to the best of my ability. Before the surgery the patient was encouraged to call the office if there is any further questions. The surgery will be performed at the [facility] on an outpatient basis.   Vivi Barrack DPM

## 2020-12-25 NOTE — Patient Instructions (Signed)
Pre-Operative Instructions ? ?Congratulations, you have decided to take an important step to improving your quality of life.  You can be assured that the doctors of [facility] will be with you every step of the way. ? ?Plan to be at the surgery center/hospital at least 1 (one) hour prior to your scheduled time unless otherwise directed by the surgical center/hospital staff.  You must have a responsible adult accompany you, remain during the surgery and drive you home.  Make sure you have directions to the surgical center/hospital and know how to get there on time. ?For hospital based surgery you will need to obtain a history and physical form from your family physician within 1 month prior to the date of surgery- we will give you a form for you primary physician.  ?We make every effort to accommodate the date you request for surgery.  There are however, times where surgery dates or times have to be moved.  We will contact you as soon as possible if a change in schedule is required.   ?No Aspirin/Ibuprofen for one week before surgery.  If you are on aspirin, any non-steroidal anti-inflammatory medications (Mobic, Aleve, Ibuprofen) you should stop taking it 7 days prior to your surgery.  You make take Tylenol  For pain prior to surgery.  ?Medications- If you are taking daily heart and blood pressure medications, seizure, reflux, allergy, asthma, anxiety, pain or diabetes medications, make sure the surgery center/hospital is aware before the day of surgery so they may notify you which medications to take or avoid the day of surgery. ?No food or drink after midnight the night before surgery unless directed otherwise by surgical center/hospital staff. ?No alcoholic beverages 24 hours prior to surgery.  No smoking 24 hours prior to or 24 hours after surgery. ?Wear loose pants or shorts- loose enough to fit over bandages, boots, and casts. ?No slip on shoes, sneakers are best. ?Bring your boot with you to the  surgery center/hospital.  Also bring crutches or a walker if your physician has prescribed it for you.  If you do not have this equipment, it will be provided for you after surgery. ?If you have not been contracted by the surgery center/hospital by the day before your surgery, call to confirm the date and time of your surgery. ?Leave-time from work may vary depending on the type of surgery you have.  Appropriate arrangements should be made prior to surgery with your employer. ?Prescriptions will be provided immediately following surgery by your doctor.  Have these filled as soon as possible after surgery and take the medication as directed. ?Remove nail polish on the operative foot. ?Wash the night before surgery.  The night before surgery wash the foot and leg well with the antibacterial soap provided and water paying special attention to beneath the toenails and in between the toes.  Rinse thoroughly with water and dry well with a towel.  Perform this wash unless told not to do so by your physician. ? ?Enclosed: 1 Ice pack (please put in freezer the night before surgery) ?  1 Hibiclens skin cleaner ?  Pre-op Instructions ? ?If you have any questions regarding the instructions, do not hesitate to call our office at any point during this process.  ? ?[place]: [address] [place], [place] [address] [phone] ? ?[place]: [address] [place], [place] [address] [phone] ? ?Dr. Azai Gaffin, DPM ? ?

## 2020-12-26 ENCOUNTER — Encounter: Payer: Self-pay | Admitting: Podiatry

## 2020-12-26 ENCOUNTER — Telehealth: Payer: Self-pay

## 2020-12-26 NOTE — Telephone Encounter (Signed)
DOS 12/28/2020  AMPUTATION TOE MPJ JOINT 4TH LT - [id number]  [facility]  RECEIVED FAX FROM [facility] - NO AUTH REQUIRED FOR CPT [phone].

## 2020-12-28 ENCOUNTER — Other Ambulatory Visit: Payer: Self-pay | Admitting: Podiatry

## 2020-12-28 DIAGNOSIS — M86672 Other chronic osteomyelitis, left ankle and foot: Secondary | ICD-10-CM | POA: Diagnosis not present

## 2020-12-28 MED ORDER — HYDROCODONE-ACETAMINOPHEN 5-325 MG PO TABS: 1.0000 | ORAL_TABLET | Freq: Four times a day (QID) | ORAL | 0 refills | Status: DC | PRN

## 2020-12-28 NOTE — Progress Notes (Signed)
Post-op pain medications sent Finish course of current antibiotics.

## 2021-01-02 ENCOUNTER — Encounter: Payer: Self-pay | Admitting: Internal Medicine

## 2021-01-02 DIAGNOSIS — E1069 Type 1 diabetes mellitus with other specified complication: Secondary | ICD-10-CM

## 2021-01-03 ENCOUNTER — Ambulatory Visit (INDEPENDENT_AMBULATORY_CARE_PROVIDER_SITE_OTHER): Payer: 59 | Admitting: Podiatry

## 2021-01-03 ENCOUNTER — Other Ambulatory Visit: Payer: Self-pay

## 2021-01-03 ENCOUNTER — Ambulatory Visit (INDEPENDENT_AMBULATORY_CARE_PROVIDER_SITE_OTHER): Payer: 59

## 2021-01-03 DIAGNOSIS — Z9889 Other specified postprocedural states: Secondary | ICD-10-CM | POA: Diagnosis not present

## 2021-01-03 DIAGNOSIS — M869 Osteomyelitis, unspecified: Secondary | ICD-10-CM

## 2021-01-06 NOTE — Progress Notes (Signed)
Subjective: Kelli Hudson is a 30 y.o. is seen today in office s/p left fourth toe amputation preformed on 12/28/2020.  There is mild discomfort but overall she has been doing well.  No fevers or chills.  She has no other concerns today.  Objective: General: No acute distress, AAOx3  DP/PT pulses palpable, CRT < 3 sec to all digits.  Left foot: Incision is well coapted without any evidence of dehiscence with sutures intact. There is no surrounding erythema, ascending cellulitis, fluctuance, crepitus, malodor, drainage/purulence.  Some dried blood distal portion incision there is no dehiscence noted or any purulence.  There is minimal edema around the surgical site. There is no pain along the surgical site.  No significant swelling to the foot or spreading of infection.  No pain with calf compression, swelling, warmth, erythema.   Assessment and Plan:  Status post left fourth amputation, doing well with no complications   -Treatment options discussed including all alternatives, risks, and complications -X-rays obtained reviewed.  Status post fourth toe amputation previous partial fifth amputation.  No evidence of acute fracture, osteomyelitis.  Chronic changes present of the fourth metatarsal head compared to prior x-rays. -Incisions healing well.  A small amount of antibiotic ointment and a bandage applied.  Discussed that she can start to change the bandage every couple days as needed.  Continue offloading, elevation. -Reviewed the pathology report.  Although did not show osteomyelitis I contacted the pathologist.  He did state there was area of healing remodeling likely chronic osteomyelitis. -Her mom is also asking about disability and other issues given her foot issues as well as eyesight as well as other complications from diabetes.  There is still the process of disability and I agree with this. -Encouraged daily foot inspection, glucose control. -Follow-up as scheduled or sooner if  any problems arise. In the meantime, encouraged to call the office with any questions, concerns, change in symptoms.   Ovid Curd, DPM

## 2021-01-07 ENCOUNTER — Encounter: Payer: Self-pay | Admitting: Podiatry

## 2021-01-14 ENCOUNTER — Other Ambulatory Visit: Payer: Self-pay

## 2021-01-14 ENCOUNTER — Encounter: Payer: Self-pay | Admitting: Podiatry

## 2021-01-14 ENCOUNTER — Ambulatory Visit (INDEPENDENT_AMBULATORY_CARE_PROVIDER_SITE_OTHER): Payer: 59 | Admitting: Podiatry

## 2021-01-14 DIAGNOSIS — M869 Osteomyelitis, unspecified: Secondary | ICD-10-CM

## 2021-01-14 DIAGNOSIS — Z9889 Other specified postprocedural states: Secondary | ICD-10-CM

## 2021-01-16 NOTE — Progress Notes (Signed)
Subjective: Kelli Hudson is a 30 y.o. is seen today in office s/p left fourth toe amputation preformed on 12/28/2020.  States that the surgical has been doing well but she was changed the bandage continues to thinks that it was too tight and some skin came off the top of her foot.  She noticed this on Tuesday.  She denies any fevers or chills.  No nausea or vomiting.  She has no other concerns.   Objective: General: No acute distress, AAOx3  DP/PT pulses palpable, CRT < 3 sec to all digits.  Left foot: Incision is well coapted without any evidence of dehiscence with sutures intact.  There is trace edema present at the surgical site.  There is no erythema, drainage or pus or signs of infection.  The dorsal aspect the foot with superficial skin breakdown along the area where it appears that the skin sloughed off.  Blister present but metatarsal base just lateral to where the skin is sloughed off the dorsal foot.  I do think this coming from where the bandage is rubbing. No pain with calf compression, swelling, warmth, erythema.   Assessment and Plan:  Status post left fourth amputation, doing well with no complications   -Treatment options discussed including all alternatives, risks, and complications -I remove the sutures today and incisions healing well.  For the dorsal foot recommended, Silvadene and nonstick dressing and dressing change daily.  We will take an old blister to the lateral aspect of foot there is no drainage that was able to be drained.  Continue offloading for this area as well.  Monitor for any further skin breakdown.  Continue with surgical shoe, elevation.  Glucose control. -Monitor for any clinical signs or symptoms of infection and directed to call the office immediately should any occur or go to the ER.  Return in about 1 week (around 01/21/2021).  Vivi Barrack DPM

## 2021-01-18 ENCOUNTER — Telehealth: Payer: Self-pay | Admitting: Internal Medicine

## 2021-01-18 NOTE — Telephone Encounter (Signed)
Kelli Hudson these are the pump settings    Basal 0.5 u/hr I: C ratio 1:12 SF 50     I tried to look for pump order but couldn't fine one here   Thanks

## 2021-01-20 ENCOUNTER — Encounter: Payer: Self-pay | Admitting: Family Medicine

## 2021-01-21 MED ORDER — FAMOTIDINE 20 MG PO TABS: 20.0000 mg | ORAL_TABLET | Freq: Two times a day (BID) | ORAL | 0 refills | Status: DC

## 2021-01-22 ENCOUNTER — Encounter: Payer: 59 | Attending: Internal Medicine | Admitting: Nutrition

## 2021-01-22 ENCOUNTER — Ambulatory Visit (INDEPENDENT_AMBULATORY_CARE_PROVIDER_SITE_OTHER): Payer: 59 | Admitting: Podiatry

## 2021-01-22 ENCOUNTER — Encounter: Payer: Self-pay | Admitting: Podiatry

## 2021-01-22 ENCOUNTER — Other Ambulatory Visit: Payer: Self-pay

## 2021-01-22 DIAGNOSIS — L03032 Cellulitis of left toe: Secondary | ICD-10-CM

## 2021-01-22 DIAGNOSIS — E1065 Type 1 diabetes mellitus with hyperglycemia: Secondary | ICD-10-CM | POA: Diagnosis not present

## 2021-01-22 DIAGNOSIS — L02612 Cutaneous abscess of left foot: Secondary | ICD-10-CM

## 2021-01-22 DIAGNOSIS — L97522 Non-pressure chronic ulcer of other part of left foot with fat layer exposed: Secondary | ICD-10-CM

## 2021-01-22 MED ORDER — DOXYCYCLINE HYCLATE 100 MG PO TABS: 100.0000 mg | ORAL_TABLET | Freq: Two times a day (BID) | ORAL | 0 refills | Status: DC

## 2021-01-22 MED ORDER — SANTYL 250 UNIT/GM EX OINT: 1.0000 "application " | TOPICAL_OINTMENT | Freq: Every day | CUTANEOUS | 1 refills | Status: DC

## 2021-01-22 NOTE — Patient Instructions (Signed)
Collagenase ointment What is this medication? COLLAGENASE (kohl LAH jen ace) is an enzyme that breaks down collagen in damaged tissue and helps healthy tissue to grow. It may help wounds heal faster. This medicine may be used for other purposes; ask your health care provider or pharmacist if you have questions. COMMON BRAND NAME(S): Santyl What should I tell my care team before I take this medication? They need to know if you have any of these conditions: an unusual or allergic reaction to collagenase, other medicines, foods, dyes, or preservatives pregnant or trying to get pregnant breast-feeding How should I use this medication? This medicine is for external use only. Do not take by mouth. Wash the wound as directed by your health care professional. Do not scrub. If you are applying a topical antibiotic to the wound, apply the antibiotic before this medicine. Do not touch the tip of the ointment tube with any surface, especially your fingers or the wound. Try to only get the ointment on the wound itself. If some gets on normal skin, wipe away with a sterile gauze pad. Do not use your medicine more often than directed. Talk to your pediatrician regarding the use of this medicine in children. Special care may be needed. Overdosage: If you think you have taken too much of this medicine contact a poison control center or emergency room at once. NOTE: This medicine is only for you. Do not share this medicine with others. What if I miss a dose? If you miss a dose, use it as soon as you can. If it is almost time for your next dose, use only that dose. Do not use double or extra doses. What may interact with this medication? Do not take this medicine with any of the following medications: aluminum acetate, Burow's solution povidone iodine silver nitrate silver sulfadiazine This list may not describe all possible interactions. Give your health care provider a list of all the medicines, herbs,  non-prescription drugs, or dietary supplements you use. Also tell them if you smoke, drink alcohol, or use illegal drugs. Some items may interact with your medicine. What should I watch for while using this medication? Visit your doctor or health care professional for regular checks on your progress. If your wound begins to look worse, smell bad, has colored discharge or more discharge, or increases in size, contact your health care professional. You may have an infection or need a change in your treatment. If you develop a fever, chills, low blood pressure, dizziness, rapid heartbeat, or confusion, contact your health care professional immediately. You may have an infection of your blood. What side effects may I notice from receiving this medication? Side effects that you should report to your doctor or health care professional as soon as possible: breathing problems skin rash or hives Side effects that usually do not require medical attention (report to your doctor or health care professional if they continue or are bothersome): redness at the application site This list may not describe all possible side effects. Call your doctor for medical advice about side effects. You may report side effects to FDA at 1-800-FDA-1088. Where should I keep my medication? Keep out of the reach of children. Store below 25 degrees C (77 degrees F). Throw away any unused medication after the expiration date. NOTE: This sheet is a summary. It may not cover all possible information. If you have questions about this medicine, talk to your doctor, pharmacist, or health care provider.  2022 [employer]vier/[employer] Standard (2007-07-16 00:00:00)

## 2021-01-23 ENCOUNTER — Telehealth: Payer: Self-pay | Admitting: *Deleted

## 2021-01-23 ENCOUNTER — Ambulatory Visit: Payer: 59

## 2021-01-23 DIAGNOSIS — K3184 Gastroparesis: Secondary | ICD-10-CM

## 2021-01-23 DIAGNOSIS — E1069 Type 1 diabetes mellitus with other specified complication: Secondary | ICD-10-CM

## 2021-01-23 NOTE — Progress Notes (Signed)
Patient reports that she does not have a pump, but is interested in getting one.   She was shown the 3 models of insulin pumps and we discussed the advantages and disadvantages of each mode. She wants the [employer] pump.  Paperwork was filled out and faxed to [employer].  She was told to call me when she gets her supplies for training.  She had no final questions.

## 2021-01-23 NOTE — Chronic Care Management (AMB) (Addendum)
Care Management    RN Visit Note  01/23/2021 Name: Kelli Hudson MRN: [id number] DOB: [birth year]  Subjective: Kelli Hudson is a 30 y.o. year old female who is a primary care patient of Rudd, Lillette Boxer, MD. The care management team was consulted for assistance with disease management and care coordination needs.    Engaged with patient by telephone for follow up visit in response to provider referral for case management and/or care coordination services.   Consent to Services:   Ms. Mogg was given information about Care Management services today including:  Care Management services includes personalized support from designated clinical staff supervised by her physician, including individualized plan of care and coordination with other care providers 24/7 contact phone numbers for assistance for urgent and routine care needs. The patient may stop case management services at any time by phone call to the office staff.  Patient agreed to services and consent obtained.   Assessment: Review of patient past medical history, allergies, medications, health status, including review of consultants reports, laboratory and other test data, was performed as part of comprehensive evaluation and provision of chronic care management services.   SDOH (Social Determinants of Health) assessments and interventions performed:    Care Plan  No Known Allergies  Outpatient Encounter Medications as of 01/23/2021  Medication Sig   albuterol (VENTOLIN HFA) 108 (90 Base) MCG/ACT inhaler Inhale 2 puffs into the lungs every 6 (six) hours as needed for shortness of breath.   atenolol (TENORMIN) 25 MG tablet Take 1 tablet (25 mg total) by mouth 2 (two) times daily.   calcium gluconate 500 MG tablet Take 1 tablet (500 mg total) by mouth 3 (three) times daily.   collagenase (SANTYL) ointment Apply 1 application topically daily.   doxycycline (VIBRA-TABS) 100 MG tablet Take 1 tablet (100 mg  total) by mouth 2 (two) times daily.   famotidine (PEPCID) 20 MG tablet Take 1 tablet (20 mg total) by mouth 2 (two) times daily.   HYDROcodone-acetaminophen (NORCO/VICODIN) 5-325 MG tablet Take 1 tablet by mouth every 6 (six) hours as needed.   hyoscyamine (LEVBID) 0.375 MG 12 hr tablet Take 1 tablet (0.375 mg total) by mouth every 12 (twelve) hours as needed for cramping (abdominal pain).   insulin glargine (LANTUS) 100 UNIT/ML injection Inject 0.18 mLs (18 Units total) into the skin daily. (Patient taking differently: Inject 18 Units into the skin at bedtime.)   insulin regular (NOVOLIN R) 100 units/mL injection Inject 0-0.15 mLs (0-15 Units total) into the skin 3 (three) times daily before meals. CBG 70 - 120: 0 units CBG 121 - 150: 2 units CBG 151 - 200: 3 units CBG 201 - 250: 5 units CBG 251 - 300: 8 units CBG 301 - 350: 11 units CBG 351 - 400: 15 units   lisinopril (ZESTRIL) 10 MG tablet Take 1 tablet (10 mg total) by mouth daily.   metoCLOPramide (REGLAN) 10 MG tablet Take 1 tablet (10 mg total) by mouth 3 (three) times daily before meals.   Multiple Vitamin (MULTIVITAMIN WITH MINERALS) TABS tablet Take 1 tablet by mouth daily.   pantoprazole (PROTONIX) 40 MG tablet Take 40 mg by mouth daily.   promethazine (PHENERGAN) 12.5 MG tablet Take 1 tablet (12.5 mg total) by mouth every 6 (six) hours as needed for nausea or vomiting.   Blood Glucose Monitoring Suppl (TRUE METRIX METER) w/Device KIT 1 Device by Does not apply route 4 (four) times daily.   Continuous Blood Gluc Receiver (DEXCOM G6  RECEIVER) DEVI USE AS DIRECTED   Continuous Blood Gluc Sensor (DEXCOM G6 SENSOR) MISC 1 each by Does not apply route daily.   Continuous Blood Gluc Transmit (DEXCOM G6 TRANSMITTER) MISC 1 each by Does not apply route daily.   glucose blood (TRUE METRIX BLOOD GLUCOSE TEST) test strip Use as instructed   Insulin Pen Needle 31G X 5 MM MISC 1 Device by Does not apply route QID. For use with insulin pens    mupirocin ointment (BACTROBAN) 2 % Apply 1 application topically 2 (two) times daily.   TRUEPLUS LANCETS 28G MISC assist with checking blood sugar TID and qhs   [DISCONTINUED] insulin aspart (NOVOLOG) 100 UNIT/ML FlexPen Inject 5 Units into the skin 3 (three) times daily with meals.   No facility-administered encounter medications on file as of 01/23/2021.    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 ([place]) 11/09/2020   Diabetic retinopathy of both eyes associated with type 2 diabetes mellitus ([place]) 09/24/2020   Diabetic gastroparesis associated with type 1 diabetes mellitus ([place]) 08/12/2020   AKI (acute kidney injury) ([place]) 07/25/2020   GERD (gastroesophageal reflux disease) 07/23/2020   Diabetic nephropathy ([place]) 06/27/2020   Borderline hyperlipidemia 05/23/2020   Gastroparesis due to DM ([place]) 05/11/2020   Uncontrolled type 2 diabetes mellitus with gastroparesis 05/09/2020   Normocytic anemia 04/20/2020   Hyperosmolar hyperglycemic state (HHS) ([place]) 02/24/2020   Intractable nausea and vomiting    Intractable abdominal pain 01/24/2020   H. pylori infection 10/07/2019   Depression with anxiety 07/05/2019   Diabetic polyneuropathy associated with type 2 diabetes mellitus ([place]) 09/24/2017   UTI (urinary tract infection) 06/14/2011   Goiter 10/05/2009   Uncontrolled diabetes mellitus 10/05/2009   Essential hypertension 10/05/2009    Conditions to be addressed/monitored:  Diabetes I and gastroparesis  Care Plan : [facility] plan of care  Updates made by Dannielle Karvonen, RN since 01/23/2021 12:00 AM   Problem: Chronic disease management education and/ or care  coordination needs.   Priority: High   Long-Range Goal: Development of plan of care to address chronic disease management and/ or care coordination needs.   Start Date: 01/23/2021  Expected End Date: 05/17/2021  Priority: High  Current Barriers:  Knowledge Deficits related to plan of care for management of Diabetes I and gastroparesis  Chronic Disease Management support and education needs related to Diabetes I and gastroparesis Patient states she is doing well. Reports no hospital admission since September 2022.  Patient reports having infection in 4th toe of left foot which resulted in amputation. She states the area is healing well and still bandaged. She reports having follow up with podiatrist monthly.  She states she continues to take her antibiotic as prescribed.  Patient states her blood sugars are ranging from the 100's to 275.  Denies any recent blood sugars < 70.  Patient states she continues to see her nutritionist and she is working with her endocrinologist to obtain and insulin pump.   RNCM Clinical Goal(s):  Patient will verbalize basic understanding of Diabetes I and gastroparesis disease process and self health management plan as evidenced by patient report and/ or notation in chart.  take all medications  exactly as prescribed and will call provider for medication related questions as evidenced by patient report and /or notation in chart    attend all scheduled medical appointments:   as evidenced by patient report and / or notation in chart        continue to work with RN Care Manager and/or Social Worker to address care management and care coordination needs related to Diabetes I and gastroparesis as evidenced by adherence to CM Team Scheduled appointments     through collaboration with RN Care manager, provider, and care team.  Patient will have decrease in hospital admissions / ED visits.  Patient will continue follow up with nutritionist Patient will continue to work with  endocrinologist/ nutritionist to obtain insulin pump  Interventions: 1:1 collaboration with primary care provider regarding development and update of comprehensive plan of care as evidenced by provider attestation and co-signature Inter-disciplinary care team collaboration (see longitudinal plan of care) Evaluation of current treatment plan related to  self management and patient's adherence to plan as established by provider   Diabetes Interventions:  (Status:  Goal on track:  Yes.) Long Term Goal Assessed patient's understanding of A1c goal: <7% Reviewed medications with patient and discussed importance of medication adherence Discussed plans with patient for ongoing care management follow up and provided patient with direct contact information for care management team Reviewed scheduled/upcoming provider appointments including:   Reinforced endocrinologist advisement of patient to monitor blood sugars at least 3 times per day.  Lab Results  Component Value Date   HGBA1C 9.8 (H) 11/12/2020   Gastroparesis  (Status:  Goal on track:  Yes.)  Long Term Goal Evaluation of current treatment plan related to  gastroparesis ,  self-management and patient's adherence to plan as established by provider. Discussed plans with patient for ongoing care management follow up and provided patient with direct contact information for care management team Evaluation of current treatment plan related to gastroparesis and patient's adherence to plan as established by provider Reviewed medications with patient and discussed   Reviewed scheduled/upcoming provider appointments including Discussed plans with patient for ongoing care management follow up and provided patient with direct contact information for care management team  Advised patient to take anti nausea medications as prescribed Advised patient to continue to follow up with nutritionist regarding appropriate food choices Advised to monitor blood  sugars at least 3 times per day as instructed by endocrinologist.   Patient Goals/Self-Care Activities: Take medications as prescribed   Attend all scheduled provider appointments Call pharmacy for medication refills 3-7 days in advance of running out of medications Call provider office for new concerns or questions  Eat smaller meals more frequently and chew foods thoroughly Follow meal plan as advised by nutritionist Monitor blood sugars at least 3 times per day Continue to work with endocrinologist and nutritionist to obtain insulin pump       Plan: The patient has been provided with contact information for the care management team and has been advised to call with any health related questions or concerns.  The care management team will reach out to the patient again over the next 2-3 months .  Quinn Plowman RN,BSN,CCM RN Case Manager [place] [phone]

## 2021-01-23 NOTE — Patient Instructions (Signed)
Call me when you have your pump to set up an appointment for training

## 2021-01-23 NOTE — Telephone Encounter (Signed)
[facility] [phone] calling for the missing day supply on an incomplete prescription for Santyl. Please advise.

## 2021-01-23 NOTE — Patient Instructions (Signed)
Visit Information  Thank you for taking time to visit with me today. Please don't hesitate to contact me if I can be of assistance to you before our next scheduled telephone appointment.  Patient Goals/Self-Care Activities: Take medications as prescribed   Attend all scheduled provider appointments Call pharmacy for medication refills 3-7 days in advance of running out of medications Call provider office for new concerns or questions  Eat smaller meals more frequently and chew foods thoroughly Follow meal plan as advised by nutritionist Monitor blood sugars at least 3 times per day Continue to work with endocrinologist and nutritionist to obtain insulin pump  Our next appointment is by telephone on April 10, 2021 at 2:00 pm  Please call the care guide team at [phone] if you need to cancel or reschedule your appointment.   If you are experiencing a Mental Health or Behavioral Health Crisis or need someone to talk to, please call the Suicide and Crisis Lifeline: 988 call the [place] National Suicide Prevention Lifeline: 802 346 6706 or TTY: 902-545-1771 TTY 929-411-6132) to talk to a trained counselor call 1-800-273-TALK (toll free, 24 hour hotline)   Patient verbalizes understanding of instructions provided today and agrees to view in MyChart.   The patient has been provided with contact information for the care management team and has been advised to call with any health related questions or concerns.   George Ina RN,BSN,CCM RN Case Manager [place] [phone]

## 2021-01-24 NOTE — Telephone Encounter (Signed)
Called and spoke with [facility], giving information per Dr Henrene Dodge day supply),verbalized understanding and will complete the order for [facility] for patient.

## 2021-01-25 NOTE — Progress Notes (Signed)
Subjective: Kelli Hudson is a 30 y.o. is seen today in office s/p left fourth toe amputation preformed on 12/28/2020.  She said the amputation has been doing well with the wound on the top of her foot still remains and is been progressing.  Some clear drainage at times but there is no purulence that she reports.  No increased pain.  This started after she rubbed her foot too tight with a bandage and caused irritation.  No fevers or chills that she reports.  She has no other concerns.   Objective: General: No acute distress, AAOx3  DP/PT pulses palpable, CRT < 3 sec to all digits.  Left foot: Incision from the amputation site is well-healed.  The dorsal aspect of the foot is a large wound with central area of necrotic tissue with granular tissue on the periphery.  See picture below.  There is no fluctuation or crepitation.  No drainage or pus.  No ascending cellulitis.  Blister present fifth metatarsal base.  After the skin was cleaned I did lance it and clear, bloody fluid expressed there is no purulence.  No pain with calf compression, swelling, warmth, erythema.        Assessment and Plan:  Status post left fourth amputation, doing well with no complications   -Treatment options discussed including all alternatives, risks, and complications -Amputation site doing well. -For the blister I cleaned the area with alcohol and lanced this area and the fluid was cultured. -Ordered Santyl for the wound on the dorsal foot.  Continue with daily dressing changes.  I have also referred to the wound care center. -Doxycycline  Return in about 1 week (around 01/29/2021) for left foot ulcer.  Vivi Barrack DPM

## 2021-01-26 LAB — WOUND CULTURE
MICRO NUMBER:: 12720440
RESULT:: NO GROWTH
SPECIMEN QUALITY:: ADEQUATE

## 2021-01-28 ENCOUNTER — Other Ambulatory Visit: Payer: Self-pay

## 2021-01-28 ENCOUNTER — Ambulatory Visit (INDEPENDENT_AMBULATORY_CARE_PROVIDER_SITE_OTHER): Payer: 59 | Admitting: Podiatry

## 2021-01-28 ENCOUNTER — Encounter: Payer: Self-pay | Admitting: Podiatry

## 2021-01-28 DIAGNOSIS — L97522 Non-pressure chronic ulcer of other part of left foot with fat layer exposed: Secondary | ICD-10-CM

## 2021-01-28 NOTE — Patient Instructions (Signed)
Continue santyl dressing changes daily.   Collagenase ointment What is this medication? COLLAGENASE (kohl LAH jen ace) is an enzyme that breaks down collagen in damaged tissue and helps healthy tissue to grow. It may help wounds heal faster. This medicine may be used for other purposes; ask your health care provider or pharmacist if you have questions. COMMON BRAND NAME(S): Santyl What should I tell my care team before I take this medication? They need to know if you have any of these conditions: an unusual or allergic reaction to collagenase, other medicines, foods, dyes, or preservatives pregnant or trying to get pregnant breast-feeding How should I use this medication? This medicine is for external use only. Do not take by mouth. Wash the wound as directed by your health care professional. Do not scrub. If you are applying a topical antibiotic to the wound, apply the antibiotic before this medicine. Do not touch the tip of the ointment tube with any surface, especially your fingers or the wound. Try to only get the ointment on the wound itself. If some gets on normal skin, wipe away with a sterile gauze pad. Do not use your medicine more often than directed. Talk to your pediatrician regarding the use of this medicine in children. Special care may be needed. Overdosage: If you think you have taken too much of this medicine contact a poison control center or emergency room at once. NOTE: This medicine is only for you. Do not share this medicine with others. What if I miss a dose? If you miss a dose, use it as soon as you can. If it is almost time for your next dose, use only that dose. Do not use double or extra doses. What may interact with this medication? Do not take this medicine with any of the following medications: aluminum acetate, Burow's solution povidone iodine silver nitrate silver sulfadiazine This list may not describe all possible interactions. Give your health care provider  a list of all the medicines, herbs, non-prescription drugs, or dietary supplements you use. Also tell them if you smoke, drink alcohol, or use illegal drugs. Some items may interact with your medicine. What should I watch for while using this medication? Visit your doctor or health care professional for regular checks on your progress. If your wound begins to look worse, smell bad, has colored discharge or more discharge, or increases in size, contact your health care professional. You may have an infection or need a change in your treatment. If you develop a fever, chills, low blood pressure, dizziness, rapid heartbeat, or confusion, contact your health care professional immediately. You may have an infection of your blood. What side effects may I notice from receiving this medication? Side effects that you should report to your doctor or health care professional as soon as possible: breathing problems skin rash or hives Side effects that usually do not require medical attention (report to your doctor or health care professional if they continue or are bothersome): redness at the application site This list may not describe all possible side effects. Call your doctor for medical advice about side effects. You may report side effects to FDA at 1-800-FDA-1088. Where should I keep my medication? Keep out of the reach of children. Store below 25 degrees C (77 degrees F). Throw away any unused medication after the expiration date. NOTE: This sheet is a summary. It may not cover all possible information. If you have questions about this medicine, talk to your doctor, pharmacist, or health care  provider.  2022 Elsevier/Gold Standard (2007-07-16 00:00:00)

## 2021-01-30 ENCOUNTER — Telehealth: Payer: 59

## 2021-01-30 ENCOUNTER — Other Ambulatory Visit: Payer: Self-pay

## 2021-01-30 ENCOUNTER — Encounter: Payer: Self-pay | Admitting: Internal Medicine

## 2021-01-30 DIAGNOSIS — E0865 Diabetes mellitus due to underlying condition with hyperglycemia: Secondary | ICD-10-CM

## 2021-01-30 MED ORDER — INSULIN REGULAR HUMAN 100 UNIT/ML IJ SOLN: 0.0000 [IU] | Freq: Three times a day (TID) | INTRAMUSCULAR | 0 refills | Status: DC

## 2021-01-31 ENCOUNTER — Encounter: Payer: Self-pay | Admitting: Podiatry

## 2021-01-31 NOTE — Progress Notes (Signed)
Subjective: Kelli Hudson is a 30 y.o. is seen today in office s/p left fourth toe amputation preformed on 12/28/2020.  Amputation site is healed.  She presents today for follow-up evaluation of a wound on the dorsal aspect the foot.  She has been using Santyl.  She states that it looks a little wet around the edges but otherwise she has not seen any drainage or pus.  No increase in swelling or redness to her foot.  She did get her an appointment with the wound care center.  She has no other concerns.  No fevers or chills that she reports.   Objective: General: No acute distress, AAOx3  DP/PT pulses palpable, CRT < 3 sec to all digits.  Left foot: Incision from the amputation site is well-healed.  On the dorsal aspect of the foot still remains very fibrotic, necrotic tissue on central aspect there is some increase in granulation tissue.  There is no drainage or pus I can identify today there is no fluctuation crepitation but there is no malodor.  There is a blister on the lateral aspect of the foot appears to be much improved and there is no open lesion to this area.  There is no other open lesions identified today. No pain with calf compression, swelling, warmth, erythema.       Assessment and Plan:  Status post left fourth amputation, doing well with no complications   -Treatment options discussed including all alternatives, risks, and complications -Continue with Santyl dressing changes.  Follow-up with the wound care center as well.  Finished course of antibiotics reviewed the wound culture with her.  Continue offloading. -Monitor for any clinical signs or symptoms of infection and directed to call the office immediately should any occur or go to the ER.  Return in about 1 week (around 02/04/2021).  Vivi Barrack DPM

## 2021-02-01 MED ORDER — DOXYCYCLINE HYCLATE 100 MG PO TABS: 100.0000 mg | ORAL_TABLET | Freq: Two times a day (BID) | ORAL | 0 refills | Status: DC

## 2021-02-04 ENCOUNTER — Ambulatory Visit (INDEPENDENT_AMBULATORY_CARE_PROVIDER_SITE_OTHER): Payer: 59 | Admitting: Podiatry

## 2021-02-04 ENCOUNTER — Other Ambulatory Visit: Payer: Self-pay

## 2021-02-04 DIAGNOSIS — L97522 Non-pressure chronic ulcer of other part of left foot with fat layer exposed: Secondary | ICD-10-CM

## 2021-02-05 LAB — CBC WITH DIFFERENTIAL/PLATELET
Basophils Absolute: 0 10*3/uL (ref 0.0–0.2)
Basos: 0 %
EOS (ABSOLUTE): 0.1 10*3/uL (ref 0.0–0.4)
Eos: 1 %
Hematocrit: 30 % — ABNORMAL LOW (ref 34.0–46.6)
Hemoglobin: 10.1 g/dL — ABNORMAL LOW (ref 11.1–15.9)
Immature Grans (Abs): 0 10*3/uL (ref 0.0–0.1)
Immature Granulocytes: 0 %
Lymphocytes Absolute: 2.9 10*3/uL (ref 0.7–3.1)
Lymphs: 34 %
MCH: 29.4 pg (ref 26.6–33.0)
MCHC: 33.7 g/dL (ref 31.5–35.7)
MCV: 87 fL (ref 79–97)
Monocytes Absolute: 0.6 10*3/uL (ref 0.1–0.9)
Monocytes: 7 %
Neutrophils Absolute: 5.1 10*3/uL (ref 1.4–7.0)
Neutrophils: 58 %
Platelets: 386 10*3/uL (ref 150–450)
RBC: 3.44 x10E6/uL — ABNORMAL LOW (ref 3.77–5.28)
RDW: 12.9 % (ref 11.7–15.4)
WBC: 8.8 10*3/uL (ref 3.4–10.8)

## 2021-02-05 LAB — BASIC METABOLIC PANEL
BUN/Creatinine Ratio: 24 — ABNORMAL HIGH (ref 9–23)
BUN: 28 mg/dL — ABNORMAL HIGH (ref 6–20)
CO2: 24 mmol/L (ref 20–29)
Calcium: 9.6 mg/dL (ref 8.7–10.2)
Chloride: 100 mmol/L (ref 96–106)
Creatinine, Ser: 1.15 mg/dL — ABNORMAL HIGH (ref 0.57–1.00)
Glucose: 242 mg/dL — ABNORMAL HIGH (ref 70–99)
Potassium: 4.8 mmol/L (ref 3.5–5.2)
Sodium: 136 mmol/L (ref 134–144)
eGFR: 66 mL/min/{1.73_m2} (ref >59)

## 2021-02-05 LAB — C-REACTIVE PROTEIN: CRP: 1 mg/L (ref 0–10)

## 2021-02-05 LAB — SEDIMENTATION RATE: Sed Rate: 31 mm/hr (ref 0–32)

## 2021-02-06 ENCOUNTER — Other Ambulatory Visit: Payer: Self-pay | Admitting: Internal Medicine

## 2021-02-06 DIAGNOSIS — E1065 Type 1 diabetes mellitus with hyperglycemia: Secondary | ICD-10-CM

## 2021-02-07 ENCOUNTER — Ambulatory Visit: Payer: 59 | Admitting: Family Medicine

## 2021-02-07 NOTE — Progress Notes (Signed)
Subjective: Kelli Hudson is a 30 y.o. is seen today in office today for follow-up evaluation of a wound on the right foot dorsally.  She states that it is starting to become more dry.  She is concerned of some swelling.  She is sent to message and start her back on antibiotics.  She denies any fevers or chills.  No nausea or vomiting.  She has no other concerns.  Objective: General: No acute distress, AAOx3  DP/PT pulses palpable, CRT < 3 sec to all digits.  Left foot: Incision from the amputation site is well-healed.  The wound present the dorsal aspect the foot is about the same in size however it has become more dry in appearance.  There is no surrounding erythema, ascending cellulitis there is no fluctuation or crepitation but there is malodor.  Slight edema to the foot there is no erythema or warmth associated with this. No pain with calf compression, swelling, warmth, erythema.   Assessment and Plan:  Status post left fourth amputation, healed; dorsal foot wound which is new since surgery  -Treatment options discussed including all alternatives, risks, and complications -Continue Santyl dressing changes daily for now.  Awaiting referral to the wound care center.  She has an appointment scheduled and she is on the call to see if they have any cancellations.  Finished course of doxycycline.  I ordered blood work including sed rate, CRP, CBC, BMP. -Monitor for any clinical signs or symptoms of infection and directed to call the office immediately should any occur or go to the ER.  Return in about 10 days (around 02/14/2021).  Vivi Barrack DPM

## 2021-02-13 ENCOUNTER — Encounter (HOSPITAL_BASED_OUTPATIENT_CLINIC_OR_DEPARTMENT_OTHER): Payer: 59 | Admitting: Internal Medicine

## 2021-02-14 ENCOUNTER — Encounter (HOSPITAL_BASED_OUTPATIENT_CLINIC_OR_DEPARTMENT_OTHER): Payer: 59 | Attending: Physician Assistant | Admitting: Internal Medicine

## 2021-02-14 ENCOUNTER — Other Ambulatory Visit: Payer: Self-pay

## 2021-02-14 DIAGNOSIS — E1042 Type 1 diabetes mellitus with diabetic polyneuropathy: Secondary | ICD-10-CM | POA: Diagnosis not present

## 2021-02-14 DIAGNOSIS — Z89422 Acquired absence of other left toe(s): Secondary | ICD-10-CM | POA: Diagnosis not present

## 2021-02-14 DIAGNOSIS — E10621 Type 1 diabetes mellitus with foot ulcer: Secondary | ICD-10-CM | POA: Diagnosis not present

## 2021-02-14 DIAGNOSIS — L97528 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ty: Secondary | ICD-10-CM | POA: Diagnosis not present

## 2021-02-14 NOTE — Progress Notes (Signed)
Kelli, Hudson ([id number]) Visit Report for 02/14/2021 Abuse/Suicide Risk Screen Details Patient Name: Date of Service: Kelli Hudson [facility] 02/14/2021 7:30 A M Medical Record Number: [id number] Patient Account Number: [id number] Date of Birth/Sex: Treating RN: 1991-01-14 (30 y.o. Kelli Hudson Primary Care [name]: [name] Other Clinician: Referring Jerrold Haskell: Treating Bryauna Byrum/Extender: [name] in Treatment: 0 Abuse/Suicide Risk Screen Items Answer ABUSE RISK SCREEN: Has anyone close to you tried to hurt or harm you recentlyo No Do you feel uncomfortable with anyone in your familyo No Has anyone forced you do things that you didnt want to doo No Electronic Signature(s) Signed: 02/14/2021 4:49:14 PM By: Antonieta Iba Entered By: Antonieta Iba on 02/14/2021 08:16:59 -------------------------------------------------------------------------------- Activities of Daily Living Details Patient Name: Date of Service: Kelli Hudson [facility] 02/14/2021 7:30 A M Medical Record Number: [id number] Patient Account Number: [id number] Date of Birth/Sex: Treating RN: 1990/02/27 (30 y.o. Kelli Hudson Primary Care [name]: [name] Other Clinician: Referring [name]: Treating [name]/Extender: [name] in Treatment: 0 Activities of Daily Living Items Answer Activities of Daily Living (Please select one for each item) Drive Automobile Completely Able T Medications ake Completely Able Use T elephone Completely Able Care for Appearance Completely Able Use T oilet Completely Able Bath / Shower Completely Able Dress Self Completely Able Feed Self Completely Able Walk Completely Able Get In / Out Bed Completely Able Housework Completely Able Prepare Meals Completely Able Handle Money Completely Able Shop for Self Completely Able Electronic Signature(s) Signed: 02/14/2021 4:49:14 PM By:  Antonieta Iba Entered By: Antonieta Iba on 02/14/2021 08:17:22 -------------------------------------------------------------------------------- Education Screening Details Patient Name: Date of Service: Kelli Hudson [facility] 02/14/2021 7:30 A M Medical Record Number: [id number] Patient Account Number: [id number] Date of Birth/Sex: Treating RN: 06-29-1990 (30 y.o. Kelli Hudson Primary Care [name]: [name] Other Clinician: Referring Anny Sayler: Treating [name] in Treatment: 0 Primary Learner Assessed: Patient Learning Preferences/Education Level/Primary Language Learning Preference: Explanation, Demonstration, Printed Material Highest Education Level: High School Preferred Language: English Cognitive Barrier Language Barrier: No Translator Needed: No Memory Deficit: No Emotional Barrier: No Cultural/Religious Beliefs Affecting Medical Care: No Physical Barrier Impaired Vision: Yes Glasses Impaired Hearing: No Decreased Hand dexterity: No Knowledge/Comprehension Knowledge Level: High Comprehension Level: High Ability to understand written instructions: High Ability to understand verbal instructions: High Motivation Anxiety Level: Calm Cooperation: Cooperative Education Importance: Acknowledges Need Interest in Health Problems: Asks Questions Perception: Coherent Willingness to Engage in Self-Management High Activities: Readiness to Engage in Self-Management High Activities: Electronic Signature(s) Signed: 02/14/2021 4:49:14 PM By: Antonieta Iba Entered By: Antonieta Iba on 02/14/2021 08:18:15 -------------------------------------------------------------------------------- Fall Risk Assessment Details Patient Name: Date of Service: Kelli Hudson [facility] 02/14/2021 7:30 A M Medical Record Number: [id number] Patient Account Number: [id number] Date of Birth/Sex: Treating RN: [birth year] (30  y.o. Kelli Hudson Primary Care [name]: [name] Other Clinician: Referring [name]: Treating [name]/Extender: [name] in Treatment: 0 Fall Risk Assessment Items Have you had 2 or more falls in the last 12 monthso 0 No Have you had any fall that resulted in injury in the last 12 monthso 0 No FALLS RISK SCREEN History of falling - immediate or within 3 months 0 No Secondary diagnosis (Do you have 2 or more medical diagnoseso) 15 Yes Ambulatory aid None/bed rest/wheelchair/nurse 0 Yes Crutches/cane/walker 0 No Furniture 0 No Intravenous therapy Access/Saline/Heparin Lock 0 No Gait/Transferring  Normal/ bed rest/ wheelchair 0 Yes Weak (short steps with or without shuffle, stooped but able to lift head while walking, may seek 0 No support from furniture) Impaired (short steps with shuffle, may have difficulty arising from chair, head down, impaired 0 No balance) Mental Status Oriented to own ability 0 Yes Electronic Signature(s) Signed: 02/14/2021 4:49:14 PM By: Antonieta Iba Entered By: Antonieta Iba on 02/14/2021 08:18:26 -------------------------------------------------------------------------------- Foot Assessment Details Patient Name: Date of Service: Kelli Hudson [facility] 02/14/2021 7:30 A M Medical Record Number: [id number] Patient Account Number: [id number] Date of Birth/Sex: Treating RN: [birth year] (30 y.o. Kelli Hudson Primary Care Reginald Mangels: [name] Other Clinician: Referring [name]: Treating [name]/Extender: [name] in Treatment: 0 Foot Assessment Items Site Locations + = Sensation present, - = Sensation absent, C = Callus, U = Ulcer R = Redness, W = Warmth, M = Maceration, PU = Pre-ulcerative lesion F = Fissure, S = Swelling, D = Dryness Assessment Right: Left: Other Deformity: No No Prior Foot Ulcer: No Yes Prior Amputation: No Yes Charcot Joint: No  No Ambulatory Status: Ambulatory Without Help Gait: Steady Electronic Signature(s) Signed: 02/14/2021 4:49:14 PM By: Antonieta Iba Entered By: Antonieta Iba on 02/14/2021 08:25:58 -------------------------------------------------------------------------------- Nutrition Risk Screening Details Patient Name: Date of Service: Kelli Hudson [facility] 02/14/2021 7:30 A M Medical Record Number: [id number] Patient Account Number: [id number] Date of Birth/Sex: Treating RN: March 27, 1990 (30 y.o. Kelli Hudson Primary Care Sheyann Sulton: [name] Other Clinician: Referring Mayda Shippee: Treating Imaan Padgett/Extender: [name] in Treatment: 0 Height (in): 68 Weight (lbs): 183 Body Mass Index (BMI): 27.8 Nutrition Risk Screening Items Score Screening NUTRITION RISK SCREEN: I have an illness or condition that made me change the kind and/or amount of food I eat 0 No I eat fewer than two meals per day 0 No I eat few fruits and vegetables, or milk products 0 No I have three or more drinks of beer, liquor or wine almost every day 0 No I have tooth or mouth problems that make it hard for me to eat 0 No I don't always have enough money to buy the food I need 0 No I eat alone most of the time 0 No I take three or more different prescribed or over-the-counter drugs a day 1 Yes Without wanting to, I have lost or gained 10 pounds in the last six months 0 No I am not always physically able to shop, cook and/or feed myself 0 No Nutrition Protocols Good Risk Protocol 0 No interventions needed Moderate Risk Protocol High Risk Proctocol Risk Level: Good Risk Score: 1 Electronic Signature(s) Signed: 02/14/2021 4:49:14 PM By: Antonieta Iba Entered By: Antonieta Iba on 02/14/2021 08:18:43

## 2021-02-14 NOTE — Progress Notes (Signed)
MIKITA, LESMEISTER ([id number]) Visit Report for 02/14/2021 Chief Complaint Document Details Patient Name: Date of Service: Elspeth Cho [facility] 02/14/2021 7:30 A M Medical Record Number: [id number] Patient Account Number: [id number] Date of Birth/Sex: Treating RN: 1990/12/20 (30 y.o. Wynelle Link Primary Care Provider: [name] Other Clinician: Referring Provider: Treating Provider/Extender: [name] in Treatment: 0 Information Obtained from: Patient Chief Complaint Left foot wounds Electronic Signature(s) Signed: 02/14/2021 10:06:08 AM By: Geralyn Corwin DO Entered By: Geralyn Corwin on 02/14/2021 10:00:00 -------------------------------------------------------------------------------- Debridement Details Patient Name: Date of Service: Elspeth Cho [facility] 02/14/2021 7:30 A M Medical Record Number: [id number] Patient Account Number: [id number] Date of Birth/Sex: Treating RN: [birth year] (30 y.o. [name] Primary Care Provider: [name] Other Clinician: Referring Provider: Treating Provider/Extender: [name] in Treatment: 0 Debridement Performed for Assessment: Wound #1 Left,Dorsal Foot Performed By: Physician Geralyn Corwin, DO Debridement Type: Debridement Severity of Tissue Pre Debridement: Fat layer exposed Level of Consciousness (Pre-procedure): Awake and Alert Pre-procedure Verification/Time Out Yes - 08:39 Taken: Start Time: 08:40 Pain Control: Other : Benzocaine T Area Debrided (L x W): otal 4 (cm) x 2 (cm) = 8 (cm) Tissue and other material debrided: Non-Viable, Eschar, Slough, Slough Level: Non-Viable Tissue Debridement Description: Selective/Open Wound Instrument: Blade, Forceps Bleeding: Minimum Hemostasis Achieved: Pressure End Time: 08:43 Response to Treatment: Procedure was tolerated well Level of Consciousness (Post- Awake and  Alert procedure): Post Debridement Measurements of Total Wound Length: (cm) 7 Width: (cm) 4.7 Depth: (cm) 0.1 Volume: (cm) 2.584 Character of Wound/Ulcer Post Debridement: Stable Severity of Tissue Post Debridement: Fat layer exposed Post Procedure Diagnosis Same as Pre-procedure Electronic Signature(s) Signed: 02/14/2021 10:06:08 AM By: Geralyn Corwin DO Signed: 02/14/2021 4:49:14 PM By: Antonieta Iba Entered By: Antonieta Iba on 02/14/2021 08:48:46 -------------------------------------------------------------------------------- Debridement Details Patient Name: Date of Service: Elspeth Cho [facility] 02/14/2021 7:30 A M Medical Record Number: [id number] Patient Account Number: [id number] Date of Birth/Sex: Treating RN: August 26, 1990 (30 y.o. [name] Primary Care Provider: [name] Other Clinician: Referring Provider: Treating Provider/Extender: [name] in Treatment: 0 Debridement Performed for Assessment: Wound #2 Left,Lateral Foot Performed By: Physician Geralyn Corwin, DO Debridement Type: Debridement Severity of Tissue Pre Debridement: Fat layer exposed Level of Consciousness (Pre-procedure): Awake and Alert Pre-procedure Verification/Time Out Yes - 08:39 Taken: Start Time: 08:43 Pain Control: Other : Benzocaine T Area Debrided (L x W): otal 4 (cm) x 2.1 (cm) = 8.4 (cm) Tissue and other material debrided: Non-Viable, Eschar Level: Non-Viable Tissue Debridement Description: Selective/Open Wound Instrument: Blade, Forceps Bleeding: Minimum Hemostasis Achieved: Pressure End Time: 08:46 Response to Treatment: Procedure was tolerated well Level of Consciousness (Post- Awake and Alert procedure): Post Debridement Measurements of Total Wound Length: (cm) 6.1 Width: (cm) 2.1 Depth: (cm) 0.1 Volume: (cm) 1.006 Character of Wound/Ulcer Post Debridement: Stable Severity of Tissue Post Debridement: Fat layer  exposed Post Procedure Diagnosis Same as Pre-procedure Electronic Signature(s) Signed: 02/14/2021 10:06:08 AM By: Geralyn Corwin DO Signed: 02/14/2021 4:49:14 PM By: Antonieta Iba Entered By: Antonieta Iba on 02/14/2021 08:52:41 -------------------------------------------------------------------------------- Debridement Details Patient Name: Date of Service: Elspeth Cho [facility] 02/14/2021 7:30 A M Medical Record Number: [id number] Patient Account Number: [id number] Date of Birth/Sex: Treating RN: December 31, 1990 (30 y.o. [name] Primary Care Provider: [name] Other Clinician: Referring Provider: Treating Provider/Extender: [name] in Treatment: 0 Debridement Performed for Assessment: Wound #3  Left T Third oe Performed By: Physician Geralyn Corwin, DO Debridement Type: Debridement Severity of Tissue Pre Debridement: Fat layer exposed Level of Consciousness (Pre-procedure): Awake and Alert Pre-procedure Verification/Time Out Yes - 08:39 Taken: Start Time: 08:46 Pain Control: Other : Benzocaine T Area Debrided (L x W): otal 0.4 (cm) x 0.4 (cm) = 0.16 (cm) Tissue and other material debrided: Non-Viable, Eschar, Skin: Dermis Level: Skin/Dermis Debridement Description: Selective/Open Wound Instrument: Other : Gauze Bleeding: Minimum Hemostasis Achieved: Pressure End Time: 08:48 Response to Treatment: Procedure was tolerated well Level of Consciousness (Post- Awake and Alert procedure): Post Debridement Measurements of Total Wound Length: (cm) 0.4 Width: (cm) 0.4 Depth: (cm) 0.1 Volume: (cm) 0.013 Character of Wound/Ulcer Post Debridement: Stable Severity of Tissue Post Debridement: Fat layer exposed Post Procedure Diagnosis Same as Pre-procedure Electronic Signature(s) Signed: 02/14/2021 10:06:08 AM By: Geralyn Corwin DO Signed: 02/14/2021 4:49:14 PM By: Antonieta Iba Entered By: Antonieta Iba on  02/14/2021 08:53:34 -------------------------------------------------------------------------------- HPI Details Patient Name: Date of Service: Elspeth Cho [facility] 02/14/2021 7:30 A M Medical Record Number: [id number] Patient Account Number: [id number] Date of Birth/Sex: Treating RN: [birth year] (30 y.o. Wynelle Link Primary Care Provider: [name] Other Clinician: Referring Provider: Treating Provider/Extender: [name] in Treatment: 0 History of Present Illness HPI Description: Admission 02/14/2021 Ms. 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 While she was treating this wound she subsequently developed 3 additional wounds. These are located to the dorsal aspect and lateral aspect of the left foot and third toe. The wounds started out as blisters and she thinks these developed due to putting on the dressing too tightly around her foot. Currently she has been using Santyl to the wound beds however she has been leaving this to air dry throughout the day. She reports finishing doxycycline and currently denies signs of infection. Electronic Signature(s) Signed: 02/14/2021 10:06:08 AM By: Geralyn Corwin DO Entered By: Geralyn Corwin on 02/14/2021 10:03:11 -------------------------------------------------------------------------------- Physical Exam Details Patient Name: Date of Service: Elspeth Cho [facility] 02/14/2021 7:30 A M Medical Record Number: [id number] Patient Account Number: [id number] Date of Birth/Sex: Treating RN: 21-Aug-1990 (30 y.o. Wynelle Link Primary Care Provider: [name] Other Clinician: Referring  Provider: Treating Provider/Extender: [name] in Treatment: 0 Constitutional respirations regular, non-labored and within target range for patient.[name] Psychiatric pleasant and cooperative. Notes Left foot: T the dorsal aspect and lateral aspect there is a large wound with nonviable tissue throughout. T the third toe there is a small open wound with o o granulation tissue present. No surrounding signs of infection. Difficult to palpate pedal pulses. Electronic Signature(s) Signed: 02/14/2021 10:06:08 AM By: Geralyn Corwin DO Entered By: Geralyn Corwin on 02/14/2021 10:03:40 -------------------------------------------------------------------------------- Physician Orders Details Patient Name: Date of Service: Elspeth Cho [facility] 02/14/2021 7:30 A M Medical Record Number: [id number] Patient Account Number: [id number] Date of Birth/Sex: Treating RN: [birth year] (30 y.o. 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 - with Dr. Mikey Bussing Return A Bathing/ Shower/ Hygiene May shower with protection but do not get wound dressing(s) wet. Off-Loading Other: - Open T Boot (given by Podiatry) oe Additional Orders / Instructions Follow Nutritious Diet - Monitor/Control Blood Sugars to aide in wound healing. Wound Treatment Wound #1 - Foot Wound Laterality: Dorsal, Left Cleanser: Soap and Water 1 x Per Day/30  Days Discharge Instructions: May shower and wash wound with dial antibacterial soap and water prior to dressing change. Prim Dressing: Santyl Ointment 1 x Per Day/30 Days ary Discharge Instructions: Apply nickel thick amount to wound bed as instructed Secondary Dressing: ABD Pad, 5x9 1 x Per Day/30 Days Discharge Instructions: Apply over primary dressing as directed. Secured With: [employer], 4.5x3.1 (in/yd) 1 x Per Day/30 Days Discharge Instructions: Secure with Kerlix as directed. Secured With: 78M Medipore H Soft Cloth Surgical T ape, 4 x 10 (in/yd) 1 x Per Day/30 Days Discharge Instructions: Secure with tape as directed. Wound #2 - Foot Wound Laterality: Left, Lateral Cleanser: Soap and Water 1 x Per Day/30 Days Discharge Instructions: May shower and wash wound with dial antibacterial soap and water prior to dressing change. Prim Dressing: Santyl Ointment 1 x Per Day/30 Days ary Discharge Instructions: Apply nickel thick amount to wound bed as instructed Secondary Dressing: ABD Pad, 5x9 1 x Per Day/30 Days Discharge Instructions: Apply over primary dressing as directed. Secured With: [employer], 4.5x3.1 (in/yd) 1 x Per Day/30 Days Discharge Instructions: Secure with Kerlix as directed. Secured With: 78M Medipore H Soft Cloth Surgical T ape, 4 x 10 (in/yd) 1 x Per Day/30 Days Discharge Instructions: Secure with tape as directed. Wound #3 - T Third oe Wound Laterality: Left Cleanser: Soap and Water Every Other Day/30 Days Discharge Instructions: May shower and wash wound with dial antibacterial soap and water prior to dressing change. Prim Dressing: Hydrofera Blue Ready Foam, 2.5 x2.5 in Every Other Day/30 Days ary Discharge Instructions: Apply to wound bed as instructed Secondary Dressing: Woven Gauze Sponge, Non-Sterile 4x4 in Every Other Day/30 Days Discharge Instructions: Apply over primary dressing as directed. Secured With: 78M Medipore H Soft Cloth Surgical T  ape, 4 x 10 (in/yd) Every Other Day/30 Days Discharge Instructions: Secure with tape as directed. Services and Therapies nkle Brachial Index (ABI)/TBI - ABI and TBI's for non healing wounds on feet. A Electronic Signature(s) Signed: 02/14/2021 10:06:08 AM By: Geralyn Corwin DO Entered By: Geralyn Corwin on 02/14/2021 10:03:56 Prescription 02/14/2021 -------------------------------------------------------------------------------- Marcia Brash DO Patient Name: Provider: November 25, 1990 [id number] Date of Birth: NPI#H[place] Chin Sex: DEA #: [phone] Phone #: License #: Eligha Bridegroom [facility] Patient Address: [address] [place], [place] [address] [address] [place], [place] [address] [phone] Allergies No Known Allergies Provider's Orders nkle Brachial Index (ABI)/TBI - ABI and TBI's for non healing wounds on feet. A Hand Signature: Date(s): Electronic Signature(s) Signed: 02/14/2021 10:06:08 AM By: Geralyn Corwin DO Entered By: Geralyn Corwin on 02/14/2021 10:03:57 -------------------------------------------------------------------------------- Problem List Details Patient Name: Date of Service: Elspeth Cho [facility] 02/14/2021 7:30 A M Medical Record Number: [id number] Patient Account Number: [id number] Date of Birth/Sex: Treating RN: 1990/09/22 (30 y.o. 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 Wynelle Link Primary Care Provider: [name] Other Clinician: Referring Provider: Treating Provider/Extender: [name] in Treatment: 0 Subjective Chief Complaint Information obtained from Patient Left foot wounds History of Present Illness (HPI) Admission 02/14/2021 Ms. 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 While she was treating this wound she subsequently developed 3 additional wounds. These are located to the dorsal aspect and lateral aspect of the left foot and third toe. The wounds started out as blisters and she thinks these developed due to putting on the dressing too tightly around her foot. Currently she has been using Santyl to the wound beds however she has been leaving this to air dry throughout the day. She reports finishing doxycycline and currently denies signs of infection. Patient History Information obtained from Patient. Allergies No Known Allergies Family History Cancer - Maternal Grandparents,Mother, Diabetes - Mother, No family  history of Heart Disease, Hereditary Spherocytosis, Hypertension, Kidney Disease, Lung Disease, Seizures, Stroke, Thyroid Problems, Tuberculosis. Social History Never smoker, Marital Status - Single, Alcohol Use - Rarely, Drug Use - No History, Caffeine Use - Moderate. Medical History Hematologic/Lymphatic Patient has history of Anemia Endocrine Patient has history of Type I Diabetes - Since 2008 Neurologic Patient has history of Neuropathy Patient is treated with Insulin. Blood sugar is tested. Medical A Surgical History Notes nd Cardiovascular Tachycardia Gastrointestinal GERD Review of Systems (ROS) Eyes Complains or has symptoms of Glasses / Contacts. Ear/Nose/Mouth/Throat Denies complaints or symptoms of Chronic sinus problems or rhinitis. Respiratory Denies complaints or symptoms of Chronic or frequent coughs, Shortness of Breath. Genitourinary Denies complaints or symptoms of Frequent urination. Integumentary (Skin) Complains or has symptoms of Wounds - Left Foot x3. Musculoskeletal Denies complaints or symptoms of Muscle Pain, Muscle Weakness. Psychiatric Denies complaints or symptoms of Claustrophobia, Suicidal. Objective Constitutional respirations regular, non-labored and within target range for patient.. Vitals Time Taken: 8:07 AM, Height: 68 in, Source: Stated, Weight: 183 lbs, Source: Stated, BMI: 27.8, Temperature: 98.7 F, Pulse: 108 bpm, Respiratory Rate: 16 breaths/min, Blood Pressure: 109/73 mmHg, Capillary Blood Glucose: 106 mg/dl. Psychiatric pleasant and cooperative. General Notes: Left foot: T the dorsal aspect and lateral aspect there is a large wound with nonviable tissue throughout. T the third toe there is a small open o o wound with granulation tissue present. No surrounding signs of infection. Difficult to palpate pedal pulses. Integumentary (Hair, Skin) Wound #1 status is Open. Original cause of wound was  Gradually Appeared. The date acquired  was: 01/04/2021. The wound is located on the Left,Dorsal Foot. The wound measures 7cm length x 4.7cm width x 0.1cm depth; 25.84cm^2 area and 2.584cm^3 volume. There is Fat Layer (Subcutaneous Tissue) exposed. There is no tunneling or undermining noted. There is a medium amount of serosanguineous drainage noted. The wound margin is distinct with the outline attached to the wound base. There is small (1-33%) red granulation within the wound bed. There is a large (67-100%) amount of necrotic tissue within the wound bed including Eschar and Adherent Slough. Wound #2 status is Open. Original cause of wound was Blister. The date acquired was: 01/04/2021. The wound is located on the Left,Lateral Foot. The wound measures 6.1cm length x 2.1cm width x 0.1cm depth; 10.061cm^2 area and 1.006cm^3 volume. There is Fat Layer (Subcutaneous Tissue) exposed. There is no tunneling or undermining noted. There is a medium amount of serosanguineous drainage noted. The wound margin is distinct with the outline attached to the wound base. There is small (1-33%) red granulation within the wound bed. There is a large (67-100%) amount of necrotic tissue within the wound bed including Eschar. Wound #3 status is Open. Original cause of wound was Gradually Appeared. The date acquired was: 01/04/2021. The wound is located on the Left T Third. oe The wound measures 0.4cm length x 0.4cm width x 0.1cm depth; 0.126cm^2 area and 0.013cm^3 volume. There is Fat Layer (Subcutaneous Tissue) exposed. There is no tunneling or undermining noted. There is a medium amount of serosanguineous drainage noted. The wound margin is distinct with the outline attached to the wound base. There is large (67-100%) red, pink granulation within the wound bed. There is a small (1-33%) amount of necrotic tissue within the wound bed including Adherent Slough. 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 She thinks these developed from wrapping her dressings too tightly. There is nonviable tissue throughout 2 of the 3 wounds. I debrided a small area of the dorsal wound. I crosshatched the rest of the nonviable tissue as this was stuck on very tightly. I recommended continuing Santyl but keeping it covered and not letting it air dry. I recommended Hydrofera Blue to the toe wound. She states she has a walking boot. There are no obvious signs of infection on exam. Follow-up in 1 week. 47 minutes was spent on the encounter including face-to-face, EMR review and coordination of care Procedures Wound #1 Pre-procedure diagnosis of Wound #1 is a Diabetic Wound/Ulcer of the Lower Extremity located on the Left,Dorsal Foot .Severity of Tissue Pre Debridement is: Fat layer exposed. There was a Selective/Open Wound Non-Viable Tissue Debridement with a total area of 8 sq cm performed by Geralyn Corwin, DO. With the following instrument(s): Blade, and Forceps to remove Non-Viable tissue/material. Material removed includes Eschar and Slough and after achieving pain control using Other (Benzocaine). No specimens were taken. A time out was conducted at 08:39, prior to the start of the procedure. A Minimum amount of bleeding was controlled with Pressure. The procedure was tolerated well. Post Debridement Measurements: 7cm length x 4.7cm width x 0.1cm depth; 2.584cm^3 volume. Character of Wound/Ulcer Post Debridement is stable. Severity of Tissue Post Debridement is: Fat layer exposed.  Post procedure Diagnosis Wound #1: Same as Pre-Procedure Wound #2 Pre-procedure diagnosis of Wound #2 is a Diabetic Wound/Ulcer of the  Lower Extremity located on the Left,Lateral Foot .Severity of Tissue Pre Debridement is: Fat layer exposed. There was a Selective/Open Wound Non-Viable Tissue Debridement with a total area of 8.4 sq cm performed by Geralyn Corwin, DO. With the following instrument(s): Blade, and Forceps to remove Non-Viable tissue/material. Material removed includes Eschar after achieving pain control using Other (Benzocaine). No specimens were taken. A time out was conducted at 08:39, prior to the start of the procedure. A Minimum amount of bleeding was controlled with Pressure. The procedure was tolerated well. Post Debridement Measurements: 6.1cm length x 2.1cm width x 0.1cm depth; 1.006cm^3 volume. Character of Wound/Ulcer Post Debridement is stable. Severity of Tissue Post Debridement is: Fat layer exposed. Post procedure Diagnosis Wound #2: Same as Pre-Procedure Wound #3 Pre-procedure diagnosis of Wound #3 is a Diabetic Wound/Ulcer of the Lower Extremity located on the Left T Third .Severity of Tissue Pre Debridement is: oe Fat layer exposed. There was a Selective/Open Wound Skin/Dermis Debridement with a total area of 0.16 sq cm performed by Geralyn Corwin, DO. With the following instrument(s): Gauze to remove Non-Viable tissue/material. Material removed includes Eschar and Skin: Dermis and after achieving pain control using Other (Benzocaine). No specimens were taken. A time out was conducted at 08:39, prior to the start of the procedure. A Minimum amount of bleeding was controlled with Pressure. The procedure was tolerated well. Post Debridement Measurements: 0.4cm length x 0.4cm width x 0.1cm depth; 0.013cm^3 volume. Character of Wound/Ulcer Post Debridement is stable. Severity of Tissue Post Debridement is: Fat layer exposed. Post procedure Diagnosis Wound #3: Same as Pre-Procedure Plan Follow-up Appointments: Return Appointment in 1 week. - with Dr. Mikey Bussing Bathing/ Shower/ Hygiene: May shower  with protection but do not get wound dressing(s) wet. Off-Loading: Other: - Open T Boot (given by Podiatry) oe Additional Orders / Instructions: Follow Nutritious Diet - Monitor/Control Blood Sugars to aide in wound healing. Services and Therapies ordered were: Ankle Brachial Index (ABI)/TBI - ABI and TBI's for non healing wounds on feet. WOUND #1: - Foot Wound Laterality: Dorsal, Left Cleanser: Soap and Water 1 x Per Day/30 Days Discharge Instructions: May shower and wash wound with dial antibacterial soap and water prior to dressing change. Prim Dressing: Santyl Ointment 1 x Per Day/30 Days ary Discharge Instructions: Apply nickel thick amount to wound bed as instructed Secondary Dressing: ABD Pad, 5x9 1 x Per Day/30 Days Discharge Instructions: Apply over primary dressing as directed. Secured With: [employer], 4.5x3.1 (in/yd) 1 x Per Day/30 Days Discharge Instructions: Secure with Kerlix as directed. Secured With: 75M Medipore H Soft Cloth Surgical T ape, 4 x 10 (in/yd) 1 x Per Day/30 Days Discharge Instructions: Secure with tape as directed. WOUND #2: - Foot Wound Laterality: Left, Lateral Cleanser: Soap and Water 1 x Per Day/30 Days Discharge Instructions: May shower and wash wound with dial antibacterial soap and water prior to dressing change. Prim Dressing: Santyl Ointment 1 x Per Day/30 Days ary Discharge Instructions: Apply nickel thick amount to wound bed as instructed Secondary Dressing: ABD Pad, 5x9 1 x Per Day/30 Days Discharge Instructions: Apply over primary dressing as directed. Secured With: [employer], 4.5x3.1 (in/yd) 1 x Per Day/30 Days Discharge Instructions: Secure with Kerlix as directed. Secured With: 75M Medipore H Soft Cloth Surgical T ape, 4 x 10 (in/yd) 1 x Per Day/30 Days Discharge Instructions: Secure with tape  as directed. WOUND #3: - T Third Wound Laterality: Left oe Cleanser: Soap and Water Every Other Day/30 Days Discharge  Instructions: May shower and wash wound with dial antibacterial soap and water prior to dressing change. Prim Dressing: Hydrofera Blue Ready Foam, 2.5 x2.5 in Every Other Day/30 Days ary Discharge Instructions: Apply to wound bed as instructed Secondary Dressing: Woven Gauze Sponge, Non-Sterile 4x4 in Every Other Day/30 Days Discharge Instructions: Apply over primary dressing as directed. Secured With: 34M Medipore H Soft Cloth Surgical T ape, 4 x 10 (in/yd) Every Other Day/30 Days Discharge Instructions: Secure with tape as directed. 1. In office sharp debridement 2. Santyl daily 3. Hydrofera Blue 4. Follow-up in 1 week Electronic Signature(s) Signed: 02/14/2021 10:06:08 AM By: Geralyn Corwin DO Entered By: Geralyn Corwin on 02/14/2021 10:04:44 -------------------------------------------------------------------------------- HxROS Details Patient Name: Date of Service: Elspeth Cho [facility] 02/14/2021 7:30 A M Medical Record Number: [id number] Patient Account Number: [id number] Date of Birth/Sex: Treating RN: 27-Nov-1990 (30 y.o. [name] Primary Care Provider: [name] Other Clinician: Referring Provider: Treating Provider/Extender: [name] in Treatment: 0 Information Obtained From Patient Eyes Complaints and Symptoms: Positive for: Glasses / Contacts Ear/Nose/Mouth/Throat Complaints and Symptoms: Negative for: Chronic sinus problems or rhinitis Respiratory Complaints and Symptoms: Negative for: Chronic or frequent coughs; Shortness of Breath Genitourinary Complaints and Symptoms: Negative for: Frequent urination Integumentary (Skin) Complaints and Symptoms: Positive for: Wounds - Left Foot x3 Musculoskeletal Complaints and Symptoms: Negative for: Muscle Pain; Muscle Weakness Psychiatric Complaints and Symptoms: Negative for: Claustrophobia; Suicidal Hematologic/Lymphatic Medical History: Positive for:  Anemia Cardiovascular Medical History: Past Medical History Notes: Tachycardia Gastrointestinal Medical History: Past Medical History Notes: GERD Endocrine Medical History: Positive for: Type I Diabetes - Since 2008 Treated with: Insulin Blood sugar tested every day: Yes Tested : Dexcom Immunological Neurologic Medical History: Positive for: Neuropathy Oncologic Immunizations Pneumococcal Vaccine: Received Pneumococcal Vaccination: Yes Received Pneumococcal Vaccination On or After 60th Birthday: No Implantable Devices None Family and Social History Cancer: Yes - Maternal Grandparents,Mother; Diabetes: Yes - Mother; Heart Disease: No; Hereditary Spherocytosis: No; Hypertension: No; Kidney Disease: No; Lung Disease: No; Seizures: No; Stroke: No; Thyroid Problems: No; Tuberculosis: No; Never smoker; Marital Status - Single; Alcohol Use: Rarely; Drug Use: No History; Caffeine Use: Moderate; Financial Concerns: No; Food, Clothing or Shelter Needs: No; Support System Lacking: No; Transportation Concerns: No Electronic Signature(s) Signed: 02/14/2021 10:06:08 AM By: Geralyn Corwin DO Signed: 02/14/2021 4:49:14 PM By: Antonieta Iba Entered By: Antonieta Iba on 02/14/2021 08:16:45 -------------------------------------------------------------------------------- SuperBill Details Patient Name: Date of Service: Elspeth Cho [facility] 02/14/2021 Medical Record Number: [id number] Patient Account Number: [id number] Date of Birth/Sex: Treating RN: [birth year] (30 y.o. 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) I10 Essential (primary) hypertension Facility Procedures CPT4 Code: [id number] Description: 99213 - WOUND CARE VISIT-LEV 3 EST PT Modifier: 25 Quantity: 1 CPT4 Code: [id number] Description: 97597 - DEBRIDE WOUND 1ST 20 SQ CM OR < ICD-10 Diagnosis Description L97.528 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 E10.621 Type 1 diabetes mellitus with foot ulcer Modifier: ty Quantity: 1 Physician Procedures :  CPT4 Code Description Modifier [id number] 99204 - WC PHYS LEVEL 4 - NEW PT ICD-10 Diagnosis Description L97.528 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ty E10.621 Type 1 diabetes mellitus with foot ulcer E10.42  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

## 2021-02-15 ENCOUNTER — Encounter: Payer: 59 | Admitting: Podiatry

## 2021-02-15 NOTE — Progress Notes (Signed)
FALAK, FRITSCH ([id number]) Visit Report for 02/14/2021 Allergy List Details Patient Name: Date of Service: Kelli Hudson [facility] 02/14/2021 7:30 A M Medical Record Number: [id number] Patient Account Number: [id number] Date of Birth/Sex: Treating RN: 06/25/1990 (30 y.o. [name] Primary Care Nickolai Rinks: Arlester Marker Other Clinician: Referring Mailani Degroote: Treating Deaira Leckey/Extender: Clemon Chambers in Treatment: 0 Allergies Active Allergies No Known Allergies Allergy Notes Electronic Signature(s) Signed: 02/14/2021 4:49:14 PM By: Lorrin Jackson Entered By: Lorrin Jackson on 02/14/2021 08:08:39 -------------------------------------------------------------------------------- Arrival Information Details Patient Name: Date of Service: Kelli Hudson [facility] 02/14/2021 7:30 A M Medical Record Number: [id number] Patient Account Number: [id number] Date of Birth/Sex: Treating RN: Jun 15, 1990 (30 y.o. [name] Primary Care Marium Ragan: Arlester Marker Other Clinician: Referring Kyo Cocuzza: Treating Markez Dowland/Extender: Clemon Chambers in Treatment: 0 Visit Information Patient Arrived: Ambulatory Arrival Time: 08:08 Transfer Assistance: None Patient Identification Verified: Yes Secondary Verification Process Completed: Yes Patient Requires Transmission-Based Precautions: No Patient Has Alerts: No Electronic Signature(s) Signed: 02/14/2021 4:49:14 PM By: Lorrin Jackson Entered By: Lorrin Jackson on 02/14/2021 08:11:29 -------------------------------------------------------------------------------- Clinic Level of Care Assessment Details Patient Name: Date of Service: Kelli Hudson [facility] 02/14/2021 7:30 A M Medical Record Number: [id number] Patient Account Number: [id number] Date of Birth/Sex: Treating RN: August 17, 1990 (30 y.o. [name] Primary Care Benigna Delisi: Other Clinician: Arlester Marker Referring  Koral Thaden: Treating Cavion Faiola/Extender: Clemon Chambers in Treatment: 0 Clinic Level of Care Assessment Items TOOL 1 Quantity Score X- 1 0 Use when EandM and Procedure is performed on INITIAL visit ASSESSMENTS - Nursing Assessment / Reassessment X- 1 20 General Physical Exam (combine w/ comprehensive assessment (listed just below) when performed on new pt. evals) X- 1 25 Comprehensive Assessment (HX, ROS, Risk Assessments, Wounds Hx, etc.) ASSESSMENTS - Wound and Skin Assessment / Reassessment []  - 0 Dermatologic / Skin Assessment (not related to wound area) ASSESSMENTS - Ostomy and/or Continence Assessment and Care []  - 0 Incontinence Assessment and Management []  - 0 Ostomy Care Assessment and Management (repouching, etc.) PROCESS - Coordination of Care []  - 0 Simple Patient / Family Education for ongoing care X- 1 20 Complex (extensive) Patient / Family Education for ongoing care X- 1 10 Staff obtains Programmer, systems, Records, T Results / Process Orders est []  - 0 Staff telephones HHA, Nursing Homes / Clarify orders / etc []  - 0 Routine Transfer to another Facility (non-emergent condition) []  - 0 Routine Hospital Admission (non-emergent condition) []  - 0 New Admissions / Biomedical engineer / Ordering NPWT Apligraf, etc. , []  - 0 Emergency Hospital Admission (emergent condition) PROCESS - Special Needs []  - 0 Pediatric / Minor Patient Management []  - 0 Isolation Patient Management []  - 0 Hearing / Language / Visual special needs []  - 0 Assessment of Community assistance (transportation, D/C planning, etc.) []  - 0 Additional assistance / Altered mentation []  - 0 Support Surface(s) Assessment (bed, cushion, seat, etc.) INTERVENTIONS - Miscellaneous []  - 0 External ear exam []  - 0 Patient Transfer (multiple staff / Civil Service fast streamer / Similar devices) []  - 0 Simple Staple / Suture removal (25 or less) []  - 0 Complex Staple / Suture removal (26  or more) []  - 0 Hypo/Hyperglycemic Management (do not check if billed separately) X- 1 15 Ankle / Brachial Index (ABI) - do not check if billed separately Has the patient been seen at the hospital within the last three years: Yes Total Score: 90 Level Of Care:  New/Established - Level 3 Electronic Signature(s) Signed: 02/14/2021 4:49:14 PM By: Lorrin Jackson Entered By: Lorrin Jackson on 02/14/2021 08:58:54 -------------------------------------------------------------------------------- Encounter Discharge Information Details Patient Name: Date of Service: Kelli Hudson [facility] 02/14/2021 7:30 A M Medical Record Number: [id number] Patient Account Number: [id number] Date of Birth/Sex: Treating RN: Dec 21, 1990 (30 y.o. [name] Primary Care Amed Datta: Arlester Marker Other Clinician: Referring Colleene Swarthout: Treating Synthia Fairbank/Extender: Clemon Chambers in Treatment: 0 Encounter Discharge Information Items Post Procedure Vitals Discharge Condition: Stable Temperature (F): 98.7 Ambulatory Status: Ambulatory Pulse (bpm): 108 Discharge Destination: Home Respiratory Rate (breaths/min): 16 Transportation: Private Auto Blood Pressure (mmHg): 109/73 Schedule Follow-up Appointment: Yes Clinical Summary of Care: Provided on 02/14/2021 Form Type Recipient Paper Patient Patient Electronic Signature(s) Signed: 02/14/2021 4:49:14 PM By: Lorrin Jackson Entered By: Lorrin Jackson on 02/14/2021 09:09:01 -------------------------------------------------------------------------------- Lower Extremity Assessment Details Patient Name: Date of Service: Kelli Hudson [facility] 02/14/2021 7:30 A M Medical Record Number: [id number] Patient Account Number: [id number] Date of Birth/Sex: Treating RN: March 25, 1990 (30 y.o. [name] Primary Care Azaan Leask: Arlester Marker Other Clinician: Referring Maghan Jessee: Treating Jagjit Riner/Extender: Clemon Chambers in Treatment: 0 Edema Assessment Assessed: [name]: Yes] Patrice Paradise: No] E[Left: dema] [Right: :] Calf Left: Right: Point of Measurement: 31 cm From Medial Instep 37.8 cm Ankle Left: Right: Point of Measurement: 10 cm From Medial Instep 25 cm Vascular Assessment Pulses: Dorsalis Pedis Palpable: [Left:No] Blood Pressure: Brachial: [Left:109] Ankle: [Left:Dorsalis Pedis: 158 1.45] Electronic Signature(s) Signed: 02/14/2021 4:49:14 PM By: Lorrin Jackson Entered By: Lorrin Jackson on 02/14/2021 08:34:39 -------------------------------------------------------------------------------- Multi Wound Chart Details Patient Name: Date of Service: Kelli Hudson [facility] 02/14/2021 7:30 A M Medical Record Number: [id number] Patient Account Number: [id number] Date of Birth/Sex: Treating RN: Dec 18, 1990 (30 y.o. Nancy Fetter Primary Care Frances Ambrosino: Arlester Marker Other Clinician: Referring Nakeisha Greenhouse: Treating Amiri Riechers/Extender: Clemon Chambers in Treatment: 0 Vital Signs Height(in): 68 Capillary Blood Glucose(mg/dl): 106 Weight(lbs): 183 Pulse(bpm): 108 Body Mass Index(BMI): 28 Blood Pressure(mmHg): 109/73 Temperature(F): 98.7 Respiratory Rate(breaths/min): 16 Photos: Left, Dorsal Foot Left, Lateral Foot Left T Third oe Wound Location: Gradually Appeared Blister Gradually Appeared Wounding Event: Diabetic Wound/Ulcer of the Lower Diabetic Wound/Ulcer of the Lower Diabetic Wound/Ulcer of the Lower Primary Etiology: Extremity Extremity Extremity Anemia, Type I Diabetes, Neuropathy Anemia, Type I Diabetes, Neuropathy Anemia, Type I Diabetes, Neuropathy Comorbid History: 01/04/2021 01/04/2021 01/04/2021 Date Acquired: 0 0 0 Weeks of Treatment: Open Open Open Wound Status: 7x4.7x0.1 6.1x2.1x0.1 0.4x0.4x0.1 Measurements L x W x D (cm) 25.84 10.061 0.126 A (cm) : rea 2.584 1.006 0.013 Volume (cm) : 0.00% 0.00% 0.00% % Reduction  in A rea: 0.00% 0.00% 0.00% % Reduction in Volume: Grade 1 Grade 1 Grade 1 Classification: Medium Medium Medium Exudate A mount: Serosanguineous Serosanguineous Serosanguineous Exudate Type: red, brown red, brown red, brown Exudate Color: Distinct, outline attached Distinct, outline attached Distinct, outline attached Wound Margin: Small (1-33%) Small (1-33%) Large (67-100%) Granulation A mount: Red Red Red, Pink Granulation Quality: Large (67-100%) Large (67-100%) Small (1-33%) Necrotic A mount: Eschar, Adherent Slough Eschar Adherent Slough Necrotic Tissue: Fat Layer (Subcutaneous Tissue): Yes Fat Layer (Subcutaneous Tissue): Yes Fat Layer (Subcutaneous Tissue): Yes Exposed Structures: Fascia: No Fascia: No Fascia: No Tendon: No Tendon: No Tendon: No Muscle: No Muscle: No Muscle: No Joint: No Joint: No Joint: No Bone: No Bone: No Bone: No None None None Epithelialization: Debridement - Selective/Open Wound Debridement - Selective/Open Wound Debridement - Selective/Open  Wound Debridement: Pre-procedure Verification/Time Out 08:39 08:39 08:39 Taken: Other Other Other Pain Control: Necrotic/Eschar, Therapist, occupational Tissue Debrided: Non-Viable Tissue Non-Viable Tissue Skin/Dermis Level: 8 8.4 0.16 Debridement A (sq cm): rea Blade, Forceps Blade, Forceps Other(Gauze) Instrument: Minimum Minimum Minimum Bleeding: Pressure Pressure Pressure Hemostasis A chieved: Procedure was tolerated well Procedure was tolerated well Procedure was tolerated well Debridement Treatment Response: 7x4.7x0.1 6.1x2.1x0.1 0.4x0.4x0.1 Post Debridement Measurements L x W x D (cm) 2.584 1.006 0.013 Post Debridement Volume: (cm) N/A N/A Maceration noted Assessment Notes: Debridement Debridement Debridement Procedures Performed: Treatment Notes Wound #1 (Foot) Wound Laterality: Dorsal, Left Cleanser Soap and Water Discharge Instruction: May shower and  wash wound with dial antibacterial soap and water prior to dressing change. Peri-Wound Care Topical Primary Dressing Santyl Ointment Discharge Instruction: Apply nickel thick amount to wound bed as instructed Secondary Dressing ABD Pad, 5x9 Discharge Instruction: Apply over primary dressing as directed. Secured With [employer], 4.5x3.1 (in/yd) Discharge Instruction: Secure with Kerlix as directed. 46M Medipore H Soft Cloth Surgical T ape, 4 x 10 (in/yd) Discharge Instruction: Secure with tape as directed. Compression Wrap Compression Stockings Add-Ons Wound #2 (Foot) Wound Laterality: Left, Lateral Cleanser Soap and Water Discharge Instruction: May shower and wash wound with dial antibacterial soap and water prior to dressing change. Peri-Wound Care Topical Primary Dressing Santyl Ointment Discharge Instruction: Apply nickel thick amount to wound bed as instructed Secondary Dressing ABD Pad, 5x9 Discharge Instruction: Apply over primary dressing as directed. Secured With [employer], 4.5x3.1 (in/yd) Discharge Instruction: Secure with Kerlix as directed. 46M Medipore H Soft Cloth Surgical T ape, 4 x 10 (in/yd) Discharge Instruction: Secure with tape as directed. Compression Wrap Compression Stockings Add-Ons Wound #3 (Toe Third) Wound Laterality: Left Cleanser Soap and Water Discharge Instruction: May shower and wash wound with dial antibacterial soap and water prior to dressing change. Peri-Wound Care Topical Primary Dressing Hydrofera Blue Ready Foam, 2.5 x2.5 in Discharge Instruction: Apply to wound bed as instructed Secondary Dressing Woven Gauze Sponge, Non-Sterile 4x4 in Discharge Instruction: Apply over primary dressing as directed. Secured With 46M Medipore H Soft Cloth Surgical T ape, 4 x 10 (in/yd) Discharge Instruction: Secure with tape as directed. Compression Wrap Compression Stockings Add-Ons Electronic Signature(s) Signed: 02/14/2021  10:06:08 AM By: Geralyn Corwin DO Signed: 02/15/2021 2:54:09 PM By: Zandra Abts RN, BSN Entered By: Geralyn Corwin on 02/14/2021 09:59:51 -------------------------------------------------------------------------------- Multi-Disciplinary Care Plan Details Patient Name: Date of Service: Elspeth Cho [facility] 02/14/2021 7:30 A M Medical Record Number: [id number] Patient Account Number: [id number] Date of Birth/Sex: Treating RN: November 13, 1990 (30 y.o. [name] Primary Care Adriane Gabbert: [name] Other Clinician: Referring Lumi Winslett: Treating Martrell Eguia/Extender: [name] in Treatment: 0 Active Inactive Nutrition Nursing Diagnoses: Impaired glucose control: actual or potential Goals: Patient/caregiver verbalizes understanding of need to maintain therapeutic glucose control per primary care physician Date Initiated: 02/14/2021 Target Resolution Date: 03/14/2021 Goal Status: Active Interventions: Assess HgA1c results as ordered upon admission and as needed Provide education on elevated blood sugars and impact on wound healing Notes: Wound/Skin Impairment Nursing Diagnoses: Impaired tissue integrity Goals: Patient/caregiver will verbalize understanding of skin care regimen Date Initiated: 02/14/2021 Target Resolution Date: 03/14/2021 Goal Status: Active Ulcer/skin breakdown will have a volume reduction of 30% by week 4 Date Initiated: 02/14/2021 Target Resolution Date: 03/14/2021 Goal Status: Active Interventions: Assess patient/caregiver ability to obtain necessary supplies Assess patient/caregiver ability to perform ulcer/skin care regimen upon admission and as needed Assess ulceration(s) every visit  Provide education on ulcer and skin care Treatment Activities: Topical wound management initiated : 02/14/2021 Notes: Electronic Signature(s) Signed: 02/14/2021 4:49:14 PM By: Lorrin Jackson Entered By: Lorrin Jackson on  02/14/2021 08:35:22 -------------------------------------------------------------------------------- Pain Assessment Details Patient Name: Date of Service: Kelli Hudson [facility] 02/14/2021 7:30 A M Medical Record Number: [id number] Patient Account Number: [id number] Date of Birth/Sex: Treating RN: April 09, 1990 (30 y.o. [name] Primary Care Tyre Beaver: Arlester Marker Other Clinician: Referring Argelia Formisano: Treating Staci Dack/Extender: Clemon Chambers in Treatment: 0 Active Problems Location of Pain Severity and Description of Pain Patient Has Paino No Site Locations Pain Management and Medication Current Pain Management: Electronic Signature(s) Signed: 02/14/2021 4:49:14 PM By: Lorrin Jackson Entered By: Lorrin Jackson on 02/14/2021 08:24:17 -------------------------------------------------------------------------------- Patient/Caregiver Education Details Patient Name: Date of Service: Kelli Hudson [facility] 12/29/2022andnbsp7:30 A M Medical Record Number: [id number] Patient Account Number: [id number] Date of Birth/Gender: Treating RN: 05-17-1990 (30 y.o. [name] Primary Care Physician: Arlester Marker Other Clinician: Referring Physician: Treating Physician/Extender: Clemon Chambers in Treatment: 0 Education Assessment Education Provided To: Patient Education Topics Provided Elevated Blood Sugar/ Impact on Healing: Methods: Explain/Verbal, Printed Responses: State content correctly Wound/Skin Impairment: Methods: Demonstration, Explain/Verbal, Printed Responses: State content correctly Electronic Signature(s) Signed: 02/14/2021 4:49:14 PM By: Lorrin Jackson Entered By: Lorrin Jackson on 02/14/2021 08:58:18 -------------------------------------------------------------------------------- Wound Assessment Details Patient Name: Date of Service: Kelli Hudson [facility] 02/14/2021 7:30 A M Medical  Record Number: [id number] Patient Account Number: [id number] Date of Birth/Sex: Treating RN: 12-Jul-1990 (30 y.o. [name] Primary Care Mariavictoria Nottingham: Arlester Marker Other Clinician: Referring Denajah Farias: Treating Shannan Slinker/Extender: Clemon Chambers in Treatment: 0 Wound Status Wound Number: 1 Primary Etiology: Diabetic Wound/Ulcer of the Lower Extremity Wound Location: Left, Dorsal Foot Wound Status: Open Wounding Event: Gradually Appeared Comorbid History: Anemia, Type I Diabetes, Neuropathy Date Acquired: 01/04/2021 Weeks Of Treatment: 0 Clustered Wound: No Photos Wound Measurements Length: (cm) 7 Width: (cm) 4.7 Depth: (cm) 0.1 Area: (cm) 25.84 Volume: (cm) 2.584 % Reduction in Area: 0% % Reduction in Volume: 0% Epithelialization: None Tunneling: No Undermining: No Wound Description Classification: Grade 1 Wound Margin: Distinct, outline attached Exudate Amount: Medium Exudate Type: Serosanguineous Exudate Color: red, brown Foul Odor After Cleansing: No Slough/Fibrino Yes Wound Bed Granulation Amount: Small (1-33%) Exposed Structure Granulation Quality: Red Fascia Exposed: No Necrotic Amount: Large (67-100%) Fat Layer (Subcutaneous Tissue) Exposed: Yes Necrotic Quality: Eschar, Adherent Slough Tendon Exposed: No Muscle Exposed: No Joint Exposed: No Bone Exposed: No Treatment Notes Wound #1 (Foot) Wound Laterality: Dorsal, Left Cleanser Soap and Water Discharge Instruction: May shower and wash wound with dial antibacterial soap and water prior to dressing change. Peri-Wound Care Topical Primary Dressing Santyl Ointment Discharge Instruction: Apply nickel thick amount to wound bed as instructed Secondary Dressing ABD Pad, 5x9 Discharge Instruction: Apply over primary dressing as directed. Secured With [employer], 4.5x3.1 (in/yd) Discharge Instruction: Secure with Kerlix as directed. 6M Medipore H Soft Cloth Surgical  T ape, 4 x 10 (in/yd) Discharge Instruction: Secure with tape as directed. Compression Wrap Compression Stockings Add-Ons Electronic Signature(s) Signed: 02/14/2021 4:49:14 PM By: Lorrin Jackson Signed: 02/14/2021 5:43:35 PM By: Dellie Catholic RN Entered By: Dellie Catholic on 02/14/2021 08:28:34 -------------------------------------------------------------------------------- Wound Assessment Details Patient Name: Date of Service: Kelli Hudson [facility] 02/14/2021 7:30 A M Medical Record Number: [id number] Patient Account Number: [id number] Date of Birth/Sex: Treating RN: 10/06/1990 (30 y.o. F) [name],  Lennox LaityJodi Primary Care Kemet Nijjar: Herbie Drapeudd, Stephen Other Clinician: Referring Ravon Mortellaro: Treating Hennesy Sobalvarro/Extender: Jenene SlickerHoffman, Jessica Wagoner, Matthew Weeks in Treatment: 0 Wound Status Wound Number: 2 Primary Etiology: Diabetic Wound/Ulcer of the Lower Extremity Wound Location: Left, Lateral Foot Wound Status: Open Wounding Event: Blister Comorbid History: Anemia, Type I Diabetes, Neuropathy Date Acquired: 01/04/2021 Weeks Of Treatment: 0 Clustered Wound: No Photos Wound Measurements Length: (cm) 6.1 Width: (cm) 2.1 Depth: (cm) 0.1 Area: (cm) 10.061 Volume: (cm) 1.006 % Reduction in Area: 0% % Reduction in Volume: 0% Epithelialization: None Tunneling: No Undermining: No Wound Description Classification: Grade 1 Wound Margin: Distinct, outline attached Exudate Amount: Medium Exudate Type: Serosanguineous Exudate Color: red, brown Foul Odor After Cleansing: No Slough/Fibrino No Wound Bed Granulation Amount: Small (1-33%) Exposed Structure Granulation Quality: Red Fascia Exposed: No Necrotic Amount: Large (67-100%) Fat Layer (Subcutaneous Tissue) Exposed: Yes Necrotic Quality: Eschar Tendon Exposed: No Muscle Exposed: No Joint Exposed: No Bone Exposed: No Treatment Notes Wound #2 (Foot) Wound Laterality: Left, Lateral Cleanser Soap and Water Discharge  Instruction: May shower and wash wound with dial antibacterial soap and water prior to dressing change. Peri-Wound Care Topical Primary Dressing Santyl Ointment Discharge Instruction: Apply nickel thick amount to wound bed as instructed Secondary Dressing ABD Pad, 5x9 Discharge Instruction: Apply over primary dressing as directed. Secured With [employer] Roll Sterile, 4.5x3.1 (in/yd) Discharge Instruction: Secure with Kerlix as directed. 48M Medipore H Soft Cloth Surgical T ape, 4 x 10 (in/yd) Discharge Instruction: Secure with tape as directed. Compression Wrap Compression Stockings Add-Ons Electronic Signature(s) Signed: 02/14/2021 4:49:14 PM By: Antonieta IbaBarnhart, [name] Signed: 02/14/2021 5:43:35 PM By: Karie SchwalbeScotton, Joanne RN Entered By: Karie SchwalbeScotton, Joanne on 02/14/2021 08:29:31 -------------------------------------------------------------------------------- Wound Assessment Details Patient Name: Date of Service: [facility]30 A M Medical Record Number: [id number] Patient Account Number: [id number] Date of Birth/Sex: Treating RN: [birth year] (30 y.o. Roel CluckF) [name], [name] Primary Care Roberto Hlavaty: Herbie Drapeudd, Stephen Other Clinician: Referring Cherree Conerly: Treating Phiona Ramnauth/Extender: Jenene SlickerHoffman, Jessica Wagoner, Matthew Weeks in Treatment: 0 Wound Status Wound Number: 3 Primary Etiology: Diabetic Wound/Ulcer of the Lower Extremity Wound Location: Left T Third oe Wound Status: Open Wounding Event: Gradually Appeared Comorbid History: Anemia, Type I Diabetes, Neuropathy Date Acquired: 01/04/2021 Weeks Of Treatment: 0 Clustered Wound: No Photos Wound Measurements Length: (cm) 0.4 Width: (cm) 0.4 Depth: (cm) 0.1 Area: (cm) 0.126 Volume: (cm) 0.013 % Reduction in Area: 0% % Reduction in Volume: 0% Epithelialization: None Tunneling: No Undermining: No Wound Description Classification: Grade 1 Wound Margin: Distinct, outline attached Exudate Amount: Medium Exudate Type:  Serosanguineous Exudate Color: red, brown Foul Odor After Cleansing: No Slough/Fibrino Yes Wound Bed Granulation Amount: Large (67-100%) Exposed Structure Granulation Quality: Red, Pink Fascia Exposed: No Necrotic Amount: Small (1-33%) Fat Layer (Subcutaneous Tissue) Exposed: Yes Necrotic Quality: Adherent Slough Tendon Exposed: No Muscle Exposed: No Joint Exposed: No Bone Exposed: No Assessment Notes Maceration noted Treatment Notes Wound #3 (Toe Third) Wound Laterality: Left Cleanser Soap and Water Discharge Instruction: May shower and wash wound with dial antibacterial soap and water prior to dressing change. Peri-Wound Care Topical Primary Dressing Hydrofera Blue Ready Foam, 2.5 x2.5 in Discharge Instruction: Apply to wound bed as instructed Secondary Dressing Woven Gauze Sponge, Non-Sterile 4x4 in Discharge Instruction: Apply over primary dressing as directed. Secured With 48M Medipore H Soft Cloth Surgical T ape, 4 x 10 (in/yd) Discharge Instruction: Secure with tape as directed. Compression Wrap Compression Stockings Add-Ons Electronic Signature(s) Signed: 02/14/2021 4:49:14 PM By: Antonieta IbaBarnhart, [name] Signed: 02/14/2021 5:43:35 PM By: Baruch MerlScotton,  Mechele Claude RN Entered By: Dellie Catholic on 02/14/2021 08:30:13 -------------------------------------------------------------------------------- Vitals Details Patient Name: Date of Service: Kelli Hudson [facility] 02/14/2021 7:30 A M Medical Record Number: [id number] Patient Account Number: [id number] Date of Birth/Sex: Treating RN: 1990-07-06 (30 y.o. [name] Primary Care Mykiah Schmuck: Arlester Marker Other Clinician: Referring Ashunti Schofield: Treating Dearion Huot/Extender: Clemon Chambers in Treatment: 0 Vital Signs Time Taken: 08:07 Temperature (F): 98.7 Height (in): 68 Pulse (bpm): 108 Source: Stated Respiratory Rate (breaths/min): 16 Weight (lbs): 183 Blood Pressure (mmHg):  109/73 Source: Stated Capillary Blood Glucose (mg/dl): 106 Body Mass Index (BMI): 27.8 Reference Range: 80 - 120 mg / dl Electronic Signature(s) Signed: 02/14/2021 4:49:14 PM By: Lorrin Jackson Entered By: Lorrin Jackson on 02/14/2021 08:07:47

## 2021-02-18 ENCOUNTER — Other Ambulatory Visit (HOSPITAL_COMMUNITY): Payer: Self-pay

## 2021-02-22 ENCOUNTER — Other Ambulatory Visit: Payer: Self-pay

## 2021-02-22 ENCOUNTER — Encounter (HOSPITAL_BASED_OUTPATIENT_CLINIC_OR_DEPARTMENT_OTHER): Payer: 59 | Attending: Internal Medicine | Admitting: Internal Medicine

## 2021-02-22 DIAGNOSIS — L97528 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ty: Secondary | ICD-10-CM | POA: Diagnosis not present

## 2021-02-22 DIAGNOSIS — E1042 Type 1 diabetes mellitus with diabetic polyneuropathy: Secondary | ICD-10-CM | POA: Insufficient documentation

## 2021-02-22 DIAGNOSIS — E1110 Type 2 diabetes mellitus with ketoacidosis without coma: Secondary | ICD-10-CM | POA: Diagnosis not present

## 2021-02-22 DIAGNOSIS — E10621 Type 1 diabetes mellitus with foot ulcer: Secondary | ICD-10-CM | POA: Insufficient documentation

## 2021-02-22 DIAGNOSIS — E1043 Type 1 diabetes mellitus with diabetic autonomic (poly)neuropathy: Secondary | ICD-10-CM | POA: Diagnosis not present

## 2021-02-22 NOTE — Progress Notes (Signed)
Kelli, Hudson ([id number]) Visit Report for 02/22/2021 Chief Complaint Document Details Patient Name: Date of Service: Kelli Hudson [facility] 02/22/2021 9:45 A M Medical Record Number: [id number] Patient Account Number: [id number] Date of Birth/Sex: Treating RN: Sep 17, 1990 (31 y.o. Kelli Hudson Primary Care Provider: Herbie Hudson Other Clinician: Referring Provider: Treating Provider/Extender: Kelli Hudson in Treatment: 1 Information Obtained from: Patient Chief Complaint Left foot wounds Electronic Signature(s) Signed: 02/22/2021 12:05:38 PM By: Kelli Corwin DO Entered By: Kelli Hudson on 02/22/2021 12:01:21 -------------------------------------------------------------------------------- Debridement Details Patient Name: Date of Service: Kelli Hudson [facility] 02/22/2021 9:45 A M Medical Record Number: [id number] Patient Account Number: [id number] Date of Birth/Sex: Treating RN: [birth year] (31 y.o. Kelli Hudson, Kelli Hudson Primary Care Provider: Herbie Hudson Other Clinician: Referring Provider: Treating Provider/Extender: Kelli Hudson in Treatment: 1 Debridement Performed for Assessment: Wound #1 Left,Dorsal Foot Performed By: Physician Kelli Corwin, DO Debridement Type: Debridement Severity of Tissue Pre Debridement: Fat layer exposed Level of Consciousness (Pre-procedure): Awake and Alert Pre-procedure Verification/Time Out Yes - 09:55 Taken: Start Time: 09:56 Pain Control: Other : Benzocaine 20% T Area Debrided (L x W): otal 7.8 (cm) x 4 (cm) = 31.2 (cm) Tissue and other material debrided: Viable, Non-Viable, Eschar, Slough, Subcutaneous, Skin: Dermis , Skin: Epidermis, Fibrin/Exudate, Slough Level: Skin/Subcutaneous Tissue Debridement Description: Excisional Instrument: Curette Bleeding: Moderate Hemostasis Achieved: Pressure End Time: 10:03 Procedural Pain: 0 Post Procedural Pain: 0 Response  to Treatment: Procedure was tolerated well Level of Consciousness (Post- Awake and Alert procedure): Post Debridement Measurements of Total Wound Length: (cm) 7.8 Width: (cm) 4 Depth: (cm) 0.1 Volume: (cm) 2.45 Character of Wound/Ulcer Post Debridement: Requires Further Debridement Severity of Tissue Post Debridement: Fat layer exposed Post Procedure Diagnosis Same as Pre-procedure Electronic Signature(s) Signed: 02/22/2021 11:40:57 AM By: Kelli Stall RN, BSN Signed: 02/22/2021 12:05:38 PM By: Kelli Corwin DO Entered By: Kelli Hudson on 02/22/2021 10:04:14 -------------------------------------------------------------------------------- Debridement Details Patient Name: Date of Service: Kelli Hudson [facility] 02/22/2021 9:45 A M Medical Record Number: [id number] Patient Account Number: [id number] Date of Birth/Sex: Treating RN: December 02, 1990 (31 y.o. Kelli Hudson, Kelli Hudson Primary Care Provider: Herbie Hudson Other Clinician: Referring Provider: Treating Provider/Extender: Kelli Hudson in Treatment: 1 Debridement Performed for Assessment: Wound #2 Left,Lateral Foot Performed By: Physician Kelli Corwin, DO Debridement Type: Debridement Severity of Tissue Pre Debridement: Fat layer exposed Level of Consciousness (Pre-procedure): Awake and Alert Pre-procedure Verification/Time Out Yes - 09:55 Taken: Start Time: 09:56 Pain Control: Other : Benzocaine 20% T Area Debrided (L x W): otal 3.5 (cm) x 2 (cm) = 7 (cm) Tissue and other material debrided: Viable, Non-Viable, Eschar, Slough, Subcutaneous, Skin: Dermis , Skin: Epidermis, Fibrin/Exudate, Slough Level: Skin/Subcutaneous Tissue Debridement Description: Excisional Instrument: Curette Bleeding: Moderate Hemostasis Achieved: Pressure End Time: 10:03 Procedural Pain: 0 Post Procedural Pain: 0 Response to Treatment: Procedure was tolerated well Level of Consciousness (Post- Awake and  Alert procedure): Post Debridement Measurements of Total Wound Length: (cm) 3.5 Width: (cm) 2 Depth: (cm) 0.1 Volume: (cm) 0.55 Character of Wound/Ulcer Post Debridement: Requires Further Debridement Severity of Tissue Post Debridement: Fat layer exposed Post Procedure Diagnosis Same as Pre-procedure Electronic Signature(s) Signed: 02/22/2021 11:40:57 AM By: Kelli Stall RN, BSN Signed: 02/22/2021 12:05:38 PM By: Kelli Corwin DO Entered By: Kelli Hudson on 02/22/2021 10:05:49 -------------------------------------------------------------------------------- HPI Details Patient Name: Date of Service: Kelli Hudson [facility] 02/22/2021 9:45 A M Medical Record Number: [id number] Patient Account  Number: [id number] Date of Birth/Sex: Treating RN: 1990/12/15 (31 y.o. 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 While she was treating this wound she subsequently developed 3 additional wounds. These are located to the dorsal aspect and lateral aspect of the left foot and third toe. The wounds started out as blisters and she thinks these developed due to putting on the dressing too tightly around her foot. Currently she has been using Santyl to the wound beds however she has been leaving this to air dry throughout the day. She reports finishing doxycycline and currently denies signs of infection. 1/6; patient presents for follow-up. She has been using Santyl  to the areas of eschar and Hydrofera Blue to the toe wound. She reports improvement in wound healing. She has no issues or complaints today. She denies signs of infection. Electronic Signature(s) Signed: 02/22/2021 12:05:38 PM By: Kelli CorwinHoffman, Brendi Mccarroll DO Entered By: Kelli CorwinHoffman, Anya Murphey on 02/22/2021 12:02:25 -------------------------------------------------------------------------------- Physical Exam Details Patient Name: Date of Service: Kelli [facility] 9:45 A M Medical Record Number: [id number] Patient Account Number: [id number] Date of Birth/Sex: Treating RN: 1990/12/15 (31 y.o. Kelli Hudson) Hudson, Kelli Primary Care Provider: Herbie Drapeudd, Kelli Other Clinician: Referring Provider: Treating Provider/Extender: Kelli FentonHoffman, Aunna Snooks Hudson, Kelli Weeks in Treatment: 1 Constitutional respirations regular, non-labored and within target range for patient.[name]. Psychiatric pleasant and cooperative. Notes Left foot: T the dorsal aspect and lateral aspect there is a large wound with nonviable tissue And granulation tissue present. T the third toe there is a small o o open wound with granulation tissue present But mostly healed. No surrounding signs of infection. Difficult to palpate pedal pulses. Electronic Signature(s) Signed: 02/22/2021 12:05:38 PM By: Kelli CorwinHoffman, Leylani Duley DO Entered By: Kelli CorwinHoffman, Reiko Vinje on 02/22/2021 12:03:29 -------------------------------------------------------------------------------- Physician Orders Details Patient Name: Date of Service: Kelli [facility]NS[facility] 9:45 A M Medical Record Number: [id number] Patient Account Number: [id number] Date of Birth/Sex: Treating RN: 1990/12/15 (31 y.o. 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 - with Dr. Mikey BussingHoffman Return A Bathing/ Shower/ Hygiene May shower with protection but do not get wound dressing(s) wet. Off-Loading Other: - Open T Boot (given by Podiatry) oe Additional Orders / Instructions Follow Nutritious Diet - Monitor/Control Blood Sugars to aide in wound healing. Wound Treatment Wound #1 - Foot Wound Laterality: Dorsal, Left Cleanser: Soap and Water 1 x Per Day/30 Days Discharge Instructions: May shower and wash wound with dial antibacterial soap and water prior to dressing change. Prim Dressing: Santyl Ointment 1 x Per Day/30 Days ary Discharge Instructions: Apply nickel thick amount to wound bed as instructed Secondary Dressing: ABD Pad,  5x9 1 x Per Day/30 Days Discharge Instructions: Apply over primary dressing as directed. Secured With: [employer], 4.5x3.1 (in/yd) 1 x Per Day/30 Days Discharge Instructions: Secure with Kerlix as directed. Secured With: 57M Medipore H Soft Cloth Surgical T ape, 4 x 10 (in/yd) 1 x Per Day/30 Days Discharge Instructions: Secure with tape as directed. Wound #2 - Foot Wound Laterality: Left, Lateral Cleanser: Soap and Water 1 x Per Day/30 Days Discharge Instructions: May shower and wash wound with dial antibacterial soap and water prior to dressing change. Prim Dressing: Santyl Ointment 1 x Per Day/30 Days ary Discharge Instructions: Apply nickel thick amount to wound bed as instructed Secondary Dressing: ABD Pad, 5x9 1 x Per Day/30 Days Discharge Instructions: Apply over primary dressing as directed. Secured With: [employer], 4.5x3.1 (in/yd) 1 x Per Day/30 Days Discharge Instructions: Secure with Kerlix as  directed. Secured With: 57M Medipore H Soft Cloth Surgical T ape, 4 x 10 (in/yd) 1 x Per Day/30 Days Discharge Instructions: Secure with tape as directed. Wound #3 - T Third oe Wound Laterality: Left Cleanser: Soap and Water Every Other Day/30 Days Discharge Instructions: May shower and wash wound with dial antibacterial soap and water prior to dressing change. Prim Dressing: Hydrofera Blue Ready Foam, 2.5 x2.5 in Every Other Day/30 Days ary Discharge Instructions: Apply to wound bed as instructed Secondary Dressing: Woven Gauze Sponge, Non-Sterile 4x4 in Every Other Day/30 Days Discharge Instructions: Apply over primary dressing as directed. Secured With: 57M Medipore H Soft Cloth Surgical T ape, 4 x 10 (in/yd) Every Other Day/30 Days Discharge Instructions: Secure with tape as directed. Electronic Signature(s) Signed: 02/22/2021 12:05:38 PM By: Kelli Corwin DO Signed: 02/22/2021 12:05:38 PM By: Kelli Corwin DO Previous Signature: 02/22/2021 11:40:57 AM Version By: Kelli Stall RN, BSN Entered By: Kelli Hudson on 02/22/2021 12:03:52 -------------------------------------------------------------------------------- Problem List Details Patient Name: Date of Service: Kelli Hudson [facility] 02/22/2021 9:45 A M Medical Record Number: [id number] Patient Account Number: [id number] Date of Birth/Sex: Treating RN: 29-Nov-1990 (32 y.o. 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)  02/14/2021 No Yes I10 Essential (primary) hypertension 02/14/2021 No Yes Inactive Problems Resolved Problems Electronic Signature(s) Signed: 02/22/2021 12:05:38 PM By: Kelli Corwin DO Previous Signature: 02/22/2021 11:40:57 AM Version By: Kelli Stall RN, BSN Entered By: Kelli Hudson on 02/22/2021 11:59:29 -------------------------------------------------------------------------------- Progress Note Details Patient Name: Date of Service: Kelli Hudson [facility] 02/22/2021 9:45 A M Medical Record Number: [id number] Patient Account Number: [id number] Date of Birth/Sex: Treating RN: May 07, 1990 (31 y.o. Kelli Hudson Primary Care Provider: Herbie Hudson Other Clinician: Referring Provider: Treating Provider/Extender: Kelli Hudson in Treatment: 1 Subjective Chief Complaint Information obtained from Patient Left foot wounds History of Present Illness (HPI) Admission 02/14/2021 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 Patient recently developed osteomyelitis and underwent a left toe amputation  on 12/28/2020. While she was treating this wound she subsequently developed 3 additional wounds. These are located to the dorsal aspect and lateral aspect of the left foot and third toe. The wounds started out as blisters and she thinks these developed due to putting on the dressing too tightly around her foot. Currently she has been using Santyl to the wound beds however she has been leaving this to air dry throughout the day. She reports finishing doxycycline and currently denies signs of infection. 1/6; patient presents for follow-up. She has been using Santyl to the areas of eschar and Hydrofera Blue to the toe wound. She reports improvement in  wound healing. She has no issues or complaints today. She denies signs of infection. Patient History Information obtained from Patient. Family History Cancer - Maternal Grandparents,Mother, Diabetes - Mother, No family history of Heart Disease, Hereditary Spherocytosis, Hypertension, Kidney Disease, Lung Disease, Seizures, Stroke, Thyroid Problems, Tuberculosis. Social History Never smoker, Marital Status - Single, Alcohol Use - Rarely, Drug Use - No History, Caffeine Use - Moderate. Medical History Hematologic/Lymphatic Patient has history of Anemia Endocrine Patient has history of Type I Diabetes - Since 2008 Neurologic Patient has history of Neuropathy Medical A Surgical History Notes nd Cardiovascular Tachycardia Gastrointestinal GERD Objective Constitutional respirations regular, non-labored and within target range for patient.. Vitals Time Taken: 9:40 AM, Height: 68 in, Weight: 183 lbs, BMI: 27.8, Temperature: 98.3 F, Pulse: 106 bpm, Respiratory Rate: 16 breaths/min, Blood Pressure: 102/68 mmHg, Capillary Blood Glucose: 214 mg/dl. Psychiatric pleasant and cooperative. General Notes: Left foot: T the dorsal aspect and lateral aspect there is a large wound with nonviable tissue And granulation tissue present. T the third toe o o there is a small open wound with granulation tissue present But mostly healed. No surrounding signs of infection. Difficult to palpate pedal pulses. Integumentary (Hair, Skin) Wound #1 status is Open. Original cause of wound was Gradually Appeared. The date acquired was: 01/04/2021. The wound has been in treatment 1 weeks. The wound is located on the Left,Dorsal Foot. The wound measures 7.8cm length x 4cm width x 0.1cm depth; 24.504cm^2 area and 2.45cm^3 volume. There is Fat Layer (Subcutaneous Tissue) exposed. There is no tunneling or undermining noted. There is a medium amount of serosanguineous drainage noted. The wound margin is distinct with  the outline attached to the wound base. There is small (1-33%) red granulation within the wound bed. There is a large (67-100%) amount of necrotic tissue within the wound bed including Eschar and Adherent Slough. Wound #2 status is Open. Original cause of wound was Blister. The date acquired was: 01/04/2021. The wound has been in treatment 1 weeks. The wound is located on the Left,Lateral Foot. The wound measures 3.5cm length x 2cm width x 0.1cm depth; 5.498cm^2 area and 0.55cm^3 volume. There is Fat Layer (Subcutaneous Tissue) exposed. There is no tunneling noted, however, there is undermining starting at 1:00 and ending at 6:00 with a maximum distance of 0.2cm. There is a medium amount of serosanguineous drainage noted. The wound margin is distinct with the outline attached to the wound base. There is small (1-33%) red, friable granulation within the wound bed. There is a large (67-100%) amount of necrotic tissue within the wound bed including Eschar and Adherent Slough. Wound #3 status is Open. Original cause of wound was Gradually Appeared. The date acquired was: 01/04/2021. The wound has been in treatment 1 weeks. The wound is located on the Left T Third. The wound measures 0.1cm length x 0.1cm  width x 0.1cm depth; 0.008cm^2 area and 0.001cm^3 volume. There is Fat oe Layer (Subcutaneous Tissue) exposed. There is no tunneling or undermining noted. There is a none present amount of drainage noted. The wound margin is distinct with the outline attached to the wound base. There is no granulation within the wound bed. There is no necrotic tissue within the wound bed. 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 The left third toe wound is almost healed. I recommended continue Hydrofera Blue to this area. I debrided nonviable tissue to the 2 remaining wounds. I recommended continuing Santyl to these areas. No signs of infection. Follow-up in 1 week Procedures Wound #1 Pre-procedure diagnosis of Wound #1 is a Diabetic Wound/Ulcer of the Lower Extremity located on the Left,Dorsal Foot .Severity of Tissue Pre Debridement is: Fat layer exposed. There was a Excisional Skin/Subcutaneous Tissue Debridement with a total area of 31.2 sq cm performed by Kelli Corwin, DO. With the following instrument(s): Curette to remove Viable and Non-Viable tissue/material. Material removed includes Eschar, Subcutaneous Tissue, Slough, Skin: Dermis, Skin: Epidermis, and Fibrin/Exudate after achieving pain control using Other (Benzocaine 20%). A time out was conducted at 09:55, prior to the start of the procedure. A Moderate amount of bleeding was controlled with Pressure. The procedure was tolerated well with a pain level of 0 throughout and a pain level of 0 following the procedure. Post Debridement Measurements: 7.8cm length x 4cm width x 0.1cm depth; 2.45cm^3 volume. Character of Wound/Ulcer Post Debridement requires further debridement. Severity of Tissue Post Debridement is: Fat layer exposed. Post procedure Diagnosis Wound #1: Same as Pre-Procedure Wound #2 Pre-procedure diagnosis of Wound #2 is a Diabetic Wound/Ulcer of the Lower Extremity located on the Left,Lateral Foot .Severity of Tissue Pre Debridement is: Fat layer exposed. There was a Excisional Skin/Subcutaneous Tissue Debridement with a total area of 7 sq cm performed by Kelli Corwin, DO. With the following instrument(s): Curette to remove Viable and Non-Viable tissue/material. Material removed includes Eschar, Subcutaneous Tissue, Slough, Skin: Dermis, Skin: Epidermis, and Fibrin/Exudate after achieving pain control  using Other (Benzocaine 20%). A time out was conducted at 09:55, prior to the start of the procedure. A Moderate amount of bleeding was controlled with Pressure. The procedure was tolerated well with a pain level of 0 throughout and a pain level of 0 following the procedure. Post Debridement Measurements: 3.5cm length x 2cm width x 0.1cm depth; 0.55cm^3 volume. Character of Wound/Ulcer Post Debridement requires further debridement. Severity of Tissue Post Debridement is: Fat layer exposed. Post procedure Diagnosis Wound #2: Same as Pre-Procedure Plan Follow-up Appointments: Return Appointment in 1 week. - with Dr. Mikey Bussing Bathing/ Shower/ Hygiene: May shower with protection but do not get wound dressing(s) wet. Off-Loading: Other: - Open T Boot (given by Podiatry) oe Additional Orders / Instructions: Follow Nutritious Diet - Monitor/Control Blood Sugars to aide in wound healing. WOUND #1: - Foot Wound Laterality: Dorsal, Left Cleanser: Soap and Water 1 x Per Day/30 Days Discharge Instructions: May shower and wash wound with dial antibacterial soap and water prior to dressing change. Prim Dressing: Santyl Ointment 1 x Per Day/30 Days ary Discharge Instructions: Apply nickel thick amount to wound bed as instructed Secondary Dressing: ABD Pad, 5x9 1 x Per  Day/30 Days Discharge Instructions: Apply over primary dressing as directed. Secured With: [employer] Roll Sterile, 4.5x3.1 (in/yd) 1 x Per Day/30 Days Discharge Instructions: Secure with Kerlix as directed. Secured With: 56M Medipore H Soft Cloth Surgical T ape, 4 x 10 (in/yd) 1 x Per Day/30 Days Discharge Instructions: Secure with tape as directed. WOUND #2: - Foot Wound Laterality: Left, Lateral Cleanser: Soap and Water 1 x Per Day/30 Days Discharge Instructions: May shower and wash wound with dial antibacterial soap and water prior to dressing change. Prim Dressing: Santyl Ointment 1 x Per Day/30 Days ary Discharge Instructions: Apply  nickel thick amount to wound bed as instructed Secondary Dressing: ABD Pad, 5x9 1 x Per Day/30 Days Discharge Instructions: Apply over primary dressing as directed. Secured With: [employer] Roll Sterile, 4.5x3.1 (in/yd) 1 x Per Day/30 Days Discharge Instructions: Secure with Kerlix as directed. Secured With: 56M Medipore H Soft Cloth Surgical T ape, 4 x 10 (in/yd) 1 x Per Day/30 Days Discharge Instructions: Secure with tape as directed. WOUND #3: - T Third Wound Laterality: Left oe Cleanser: Soap and Water Every Other Day/30 Days Discharge Instructions: May shower and wash wound with dial antibacterial soap and water prior to dressing change. Prim Dressing: Hydrofera Blue Ready Foam, 2.5 x2.5 in Every Other Day/30 Days ary Discharge Instructions: Apply to wound bed as instructed Secondary Dressing: Woven Gauze Sponge, Non-Sterile 4x4 in Every Other Day/30 Days Discharge Instructions: Apply over primary dressing as directed. Secured With: 56M Medipore H Soft Cloth Surgical T ape, 4 x 10 (in/yd) Every Other Day/30 Days Discharge Instructions: Secure with tape as directed. 1. In office sharp debridement 2. Santyl 3. Hydrofera Blue 4. Follow-up in 1 week Electronic Signature(s) Signed: 02/22/2021 12:05:38 PM By: Kelli CorwinHoffman, Masiya Claassen DO Entered By: Kelli CorwinHoffman, Ajooni Karam on 02/22/2021 12:05:00 -------------------------------------------------------------------------------- HxROS Details Patient Name: Date of Service: Kelli ChoJO HNSO N-A LSTO N, [facility]/22/2021 9:45 A M Medical Record Number: [id number] Patient Account Number: [id number] Date of Birth/Sex: Treating RN: 23-Nov-1990 ([age] y.o. Kelli Hudson) Hudson, Kelli Primary Care Provider: Herbie Drapeudd, Kelli Other Clinician: Referring Provider: Treating Provider/Extender: Kelli FentonHoffman, Leomar Westberg Hudson, Kelli Weeks in Treatment: 1 Information Obtained From Patient Hematologic/Lymphatic Medical History: Positive for: Anemia Cardiovascular Medical History: Past Medical History  Notes: Tachycardia Gastrointestinal Medical History: Past Medical History Notes: GERD Endocrine Medical History: Positive for: Type I Diabetes - Since 2008 Treated with: Insulin Blood sugar tested every day: Yes Tested : Dexcom Neurologic Medical History: Positive for: Neuropathy Immunizations Pneumococcal Vaccine: Received Pneumococcal Vaccination: Yes Received Pneumococcal Vaccination On or After 60th Birthday: No Implantable Devices None Family and Social History Cancer: Yes - Maternal Grandparents,Mother; Diabetes: Yes - Mother; Heart Disease: No; Hereditary Spherocytosis: No; Hypertension: No; Kidney Disease: No; Lung Disease: No; Seizures: No; Stroke: No; Thyroid Problems: No; Tuberculosis: No; Never smoker; Marital Status - Single; Alcohol Use: Rarely; Drug Use: No History; Caffeine Use: Moderate; Financial Concerns: No; Food, Clothing or Shelter Needs: No; Support System Lacking: No; Transportation Concerns: No Electronic Signature(s) Signed: 02/22/2021 12:05:38 PM By: Kelli CorwinHoffman, [place] DO Signed: 02/22/2021 12:17:25 PM By: Antonieta IbaBarnhart, Kelli Entered By: Kelli CorwinHoffman, Triana Coover on 02/22/2021 12:02:33 -------------------------------------------------------------------------------- SuperBill Details Patient Name: Date of Service: Kelli ChoJO [facility]NSO N[facility]A [facility] Medical Record Number: [id number] Patient Account Number: [id number] Date of Birth/Sex: Treating RN: 23-Nov-1990 (31 y.o. 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) I10 Essential (primary)  hypertension Facility Procedures CPT4 Code: [id number] Description: 11042 - DEB SUBQ TISSUE 20 SQ CM/< ICD-10 Diagnosis Description L97.528 Non-pressure chronic ulcer of other part of left foot with other specified seve Modifier: rity Quantity: 1 CPT4 Code: [id number] Description: 11045 - DEB SUBQ TISS EA ADDL 20CM ICD-10 Diagnosis Description L97.528 Non-pressure chronic ulcer of other part of left foot with other specified seve Modifier: rity Quantity: 1 Physician Procedures : CPT4 Code Description Modifier [id number] 11042 - WC PHYS SUBQ TISS 20 SQ CM ICD-10 Diagnosis Description L97.528 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ty Quantity: 1 : [id number] 11045 - WC PHYS SUBQ TISS EA ADDL 20 CM ICD-10 Diagnosis Description L97.528 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ty Quantity: 1 Electronic Signature(s) Signed: 02/22/2021 12:05:38 PM By: Kelli Corwin DO Previous Signature: 02/22/2021 11:40:57 AM Version By: Kelli Stall RN, BSN Entered By: Kelli Hudson on 02/22/2021 12:05:15

## 2021-02-22 NOTE — Progress Notes (Addendum)
[name], [name] ([id number]) Visit Report for 02/22/2021 Arrival Information Details Patient Name: Date of Service: Kelli Hudson [facility] 02/22/2021 9:45 A M Medical Record Number: [id number] Patient Account Number: [id number] Date of Birth/Sex: Treating RN: January 27, 1991 (31 y.o. [name], Meta.Reding Primary Care Kaleen Rochette: Arlester Marker Other Clinician: Referring Bronislaus Verdell: Treating Tyshay Adee/Extender: Fermin Schwab in Treatment: 1 Visit Information History Since Last Visit Added or deleted any medications: No Patient Arrived: Ambulatory Any new allergies or adverse reactions: No Arrival Time: 09:40 Had a fall or experienced change in No Accompanied By: family member activities of daily living that may affect Transfer Assistance: None risk of falls: Patient Identification Verified: Yes Signs or symptoms of abuse/neglect since last visito No Secondary Verification Process Completed: Yes Hospitalized since last visit: No Patient Requires Transmission-Based Precautions: No Implantable device outside of the clinic excluding No Patient Has Alerts: No cellular tissue based products placed in the center since last visit: Has Dressing in Place as Prescribed: Yes Pain Present Now: No Electronic Signature(s) Signed: 02/22/2021 11:40:57 AM By: Deon Pilling RN, BSN Entered By: Deon Pilling on 02/22/2021 09:50:18 -------------------------------------------------------------------------------- Encounter Discharge Information Details Patient Name: Date of Service: Kelli Hudson [facility] 02/22/2021 9:45 A M Medical Record Number: [id number] Patient Account Number: [id number] Date of Birth/Sex: Treating RN: March 21, 1990 (31 y.o. [name], [name] Primary Care [name]: Arlester Marker Other Clinician: Referring [name]: Treating [name]/Extender: Fermin Schwab in Treatment: 1 Encounter Discharge Information Items Post Procedure  Vitals Discharge Condition: Stable Temperature (F): 98.3 Ambulatory Status: Ambulatory Pulse (bpm): 106 Discharge Destination: Home Respiratory Rate (breaths/min): 16 Transportation: Private Auto Blood Pressure (mmHg): 102/68 Accompanied By: family member Schedule Follow-up Appointment: Yes Clinical Summary of Care: Electronic Signature(s) Signed: 02/22/2021 11:40:57 AM By: Deon Pilling RN, BSN Entered By: Deon Pilling on 02/22/2021 10:08:16 -------------------------------------------------------------------------------- Lower Extremity Assessment Details Patient Name: Date of Service: Kelli Hudson [facility] 02/22/2021 9:45 A M Medical Record Number: [id number] Patient Account Number: [id number] Date of Birth/Sex: Treating RN: 22-Jul-1990 (31 y.o. Debby Bud Primary Care Cottrell Gentles: Arlester Marker Other Clinician: Referring Dyllin Gulley: Treating Odaly Peri/Extender: Fermin Schwab in Treatment: 1 Edema Assessment Assessed: [name]: Yes] [Right: No] E[Left: dema] [Right: :] Calf Left: Right: Point of Measurement: 31 cm From Medial Instep 35.5 cm Ankle Left: Right: Point of Measurement: 10 cm From Medial Instep 22 cm Vascular Assessment Pulses: Dorsalis Pedis Palpable: [Left:Yes] Electronic Signature(s) Signed: 02/22/2021 11:40:57 AM By: Deon Pilling RN, BSN Entered By: Deon Pilling on 02/22/2021 09:50:50 -------------------------------------------------------------------------------- Multi Wound Chart Details Patient Name: Date of Service: Kelli Hudson Kelli Hudson 02/22/2021 9:45 A M Medical Record Number: [id number] Patient Account Number: [id number] Date of Birth/Sex: Treating RN: September 26, 1990 (31 y.o. [name] Primary Care [name]: Arlester Marker Other Clinician: Referring Marbella Markgraf: Treating Saloma Cadena/Extender: Fermin Schwab in Treatment: 1 Vital Signs Height(in): 68 Capillary Blood Glucose(mg/dl):  214 Weight(lbs): 183 Pulse(bpm): 106 Body Mass Index(BMI): 28 Blood Pressure(mmHg): 102/68 Temperature(F): 98.3 Respiratory Rate(breaths/min): 16 Photos: [1:Left, Dorsal Foot] [2:Left, Lateral Foot] [3:Left T Third oe] Wound Location: [1:Gradually Appeared] [2:Blister] [3:Gradually Appeared] Wounding Event: [1:Diabetic Wound/Ulcer of the Lower Diabetic Wound/Ulcer of the Lower] [3:Diabetic Wound/Ulcer of the Lower] Primary Etiology: [1:Extremity Anemia, Type I Diabetes, Neuropathy] [2:Extremity] [3:Extremity Anemia, Type I Diabetes, Neuropathy] Comorbid History: [1:01/04/2021] [2:01/04/2021] [3:01/04/2021] Date Acquired: [1:1] [2:1] [3:1] Weeks of Treatment: [1:Open] [2:Open] [3:Open] Wound Status: [1:7.8x4x0.1] [2:3.5x2x0.1] [3:0.1x0.1x0.1] Measurements L x W x D (cm) [  1:24.504] [2:5.498] [3:0.008] A (cm) : rea [1:2.45] [2:0.55] [3:0.001] Volume (cm) : [1:5.20%] [2:45.40%] [3:93.70%] % Reduction in A [1:rea: 5.20%] [2:45.30%] [3:92.30%] % Reduction in Volume: [2:1] Starting Position 1 (o'clock): [2:6] Ending Position 1 (o'clock): [2:0.2] Maximum Distance 1 (cm): [1:No] [2:Yes] [3:No] Undermining: [1:Grade 1] [2:Grade 1] [3:Grade 1] Classification: [1:Medium] [2:Medium] [3:None Present] Exudate A mount: [1:Serosanguineous] [2:Serosanguineous] [3:N/A] Exudate Type: [1:red, brown] [2:red, brown] [3:N/A] Exudate Color: [1:Distinct, outline attached] [2:Distinct, outline attached] [3:Distinct, outline attached] Wound Margin: [1:Small (1-33%)] [2:Small (1-33%)] [3:None Present (0%)] Granulation A mount: [1:Red] [2:Red, Friable] [3:N/A] Granulation Quality: [1:Large (67-100%)] [2:Large (67-100%)] [3:None Present (0%)] Necrotic A mount: [1:Eschar, Adherent Slough] [2:Eschar, Adherent Slough] [3:N/A] Necrotic Tissue: [1:Fat Layer (Subcutaneous Tissue): Yes Fat Layer (Subcutaneous Tissue): Yes Fat Layer (Subcutaneous Tissue): Yes] Exposed Structures: [1:Fascia: No Tendon: No Muscle: No  Joint: No Bone: No Small (1-33%)] [2:Fascia: No Tendon: No Muscle: No Joint: No Bone: No None] [3:Fascia: No Tendon: No Muscle: No Joint: No Bone: No Large (67-100%)] Epithelialization: [1:Debridement - Excisional] [2:Debridement - Excisional] [3:N/A] Debridement: Pre-procedure Verification/Time Out 09:55 [2:09:55] [3:N/A] Taken: [1:Other] [2:Other] [3:N/A] Pain Control: [1:Necrotic/Eschar, Subcutaneous,] [2:Necrotic/Eschar, Subcutaneous,] [3:N/A] Tissue Debrided: [1:Slough Skin/Subcutaneous Tissue] [2:Slough Skin/Subcutaneous Tissue] [3:N/A] Level: [1:31.2] [2:7] [3:N/A] Debridement A (sq cm): [1:rea Curette] [2:Curette] [3:N/A] Instrument: [1:Moderate] [2:Moderate] [3:N/A] Bleeding: [1:Pressure] [2:Pressure] [3:N/A] Hemostasis Achieved: [1:0] [2:0] [3:N/A] Procedural Pain: [1:0] [2:0] [3:N/A] Post Procedural Pain: Debridement Treatment Response: Procedure was tolerated well [2:Procedure was tolerated well] [3:N/A] Post Debridement Measurements L x 7.8x4x0.1 [2:3.5x2x0.1] [3:N/A] W x D (cm) [1:2.45] [2:0.55] [3:N/A] Post Debridement Volume: (cm) [1:Debridement] [2:Debridement] [3:N/A] Treatment Notes Wound #1 (Foot) Wound Laterality: Dorsal, Left Cleanser Soap and Water Discharge Instruction: May shower and wash wound with dial antibacterial soap and water prior to dressing change. Peri-Wound Care Topical Primary Dressing Santyl Ointment Discharge Instruction: Apply nickel thick amount to wound bed as instructed Secondary Dressing ABD Pad, 5x9 Discharge Instruction: Apply over primary dressing as directed. Secured With [employer], 4.5x3.1 (in/yd) Discharge Instruction: Secure with Kerlix as directed. 41M Medipore H Soft Cloth Surgical T ape, 4 x 10 (in/yd) Discharge Instruction: Secure with tape as directed. Compression Wrap Compression Stockings Add-Ons Wound #2 (Foot) Wound Laterality: Left, Lateral Cleanser Soap and Water Discharge Instruction: May shower and  wash wound with dial antibacterial soap and water prior to dressing change. Peri-Wound Care Topical Primary Dressing Santyl Ointment Discharge Instruction: Apply nickel thick amount to wound bed as instructed Secondary Dressing ABD Pad, 5x9 Discharge Instruction: Apply over primary dressing as directed. Secured With [employer], 4.5x3.1 (in/yd) Discharge Instruction: Secure with Kerlix as directed. 41M Medipore H Soft Cloth Surgical T ape, 4 x 10 (in/yd) Discharge Instruction: Secure with tape as directed. Compression Wrap Compression Stockings Add-Ons Wound #3 (Toe Third) Wound Laterality: Left Cleanser Soap and Water Discharge Instruction: May shower and wash wound with dial antibacterial soap and water prior to dressing change. Peri-Wound Care Topical Primary Dressing Hydrofera Blue Ready Foam, 2.5 x2.5 in Discharge Instruction: Apply to wound bed as instructed Secondary Dressing Woven Gauze Sponge, Non-Sterile 4x4 in Discharge Instruction: Apply over primary dressing as directed. Secured With 41M Medipore H Soft Cloth Surgical T ape, 4 x 10 (in/yd) Discharge Instruction: Secure with tape as directed. Compression Wrap Compression Stockings Add-Ons Electronic Signature(s) Signed: 02/22/2021 12:05:38 PM By: Kalman Shan DO Signed: 02/22/2021 12:17:25 PM By: Lorrin Jackson Entered By: Kalman Shan on 02/22/2021 12:01:00 -------------------------------------------------------------------------------- Multi-Disciplinary Care Plan Details Patient Name: Date of Service: JO HNSO N-A Lilia Argue,  Greggory Brandy [facility] 02/22/2021 9:45 A M Medical Record Number: [id number] Patient Account Number: [id number] Date of Birth/Sex: Treating RN: [birth year] (31 y.o. [name], [name] Primary Care Hally Colella: Arlester Marker Other Clinician: Referring Cassian Torelli: Treating Juanito Gonyer/Extender: Fermin Schwab in Treatment: 1 Active Inactive Nutrition Nursing Diagnoses: Impaired  glucose control: actual or potential Goals: Patient/caregiver verbalizes understanding of need to maintain therapeutic glucose control per primary care physician Date Initiated: 02/14/2021 Target Resolution Date: 03/14/2021 Goal Status: Active Interventions: Assess HgA1c results as ordered upon admission and as needed Provide education on elevated blood sugars and impact on wound healing Notes: Wound/Skin Impairment Nursing Diagnoses: Impaired tissue integrity Goals: Patient/caregiver will verbalize understanding of skin care regimen Date Initiated: 02/14/2021 Target Resolution Date: 03/14/2021 Goal Status: Active Ulcer/skin breakdown will have a volume reduction of 30% by week 4 Date Initiated: 02/14/2021 Target Resolution Date: 03/14/2021 Goal Status: Active Interventions: Assess patient/caregiver ability to obtain necessary supplies Assess patient/caregiver ability to perform ulcer/skin care regimen upon admission and as needed Assess ulceration(s) every visit Provide education on ulcer and skin care Treatment Activities: Topical wound management initiated : 02/14/2021 Notes: Electronic Signature(s) Signed: 02/22/2021 11:40:57 AM By: Deon Pilling RN, BSN Entered By: Deon Pilling on 02/22/2021 09:53:54 -------------------------------------------------------------------------------- Pain Assessment Details Patient Name: Date of Service: Kelli Hudson [facility] 02/22/2021 9:45 A M Medical Record Number: [id number] Patient Account Number: [id number] Date of Birth/Sex: Treating RN: 03/16/1990 (31 y.o. Debby Bud Primary Care Aida Lemaire: Arlester Marker Other Clinician: Referring Fernandez Kenley: Treating Lorren Rossetti/Extender: Fermin Schwab in Treatment: 1 Active Problems Location of Pain Severity and Description of Pain Patient Has Paino No Patient Has Paino No Site Locations Rate the pain. Current Pain Level: 0 Pain Management and Medication Current  Pain Management: Medication: No Cold Application: No Rest: No Massage: No Activity: No T.E.N.S.: No Heat Application: No Leg drop or elevation: No Is the Current Pain Management Adequate: Adequate How does your wound impact your activities of daily livingo Sleep: No Bathing: No Appetite: No Relationship With Others: No Bladder Continence: No Emotions: No Bowel Continence: No Work: No Toileting: No Drive: No Dressing: No Hobbies: No Electronic Signature(s) Signed: 02/22/2021 11:40:57 AM By: Deon Pilling RN, BSN Entered By: Deon Pilling on 02/22/2021 09:50:41 -------------------------------------------------------------------------------- Patient/Caregiver Education Details Patient Name: Date of Service: Kelli Hudson [facility] 1/6/2023andnbsp9:45 [place] Record Number: [id number] Patient Account Number: [id number] Date of Birth/Gender: Treating RN: Dec 26, 1990 (31 y.o. Debby Bud Primary Care Physician: Arlester Marker Other Clinician: Referring Physician: Treating Physician/Extender: Fermin Schwab in Treatment: 1 Education Assessment Education Provided To: Patient and Caregiver Education Topics Provided Elevated Blood Sugar/ Impact on Healing: Handouts: Elevated Blood Sugars: How Do They Affect Wound Healing Methods: Explain/Verbal Responses: Reinforcements needed Wound/Skin Impairment: Handouts: Caring for Your Ulcer, Skin Care Do's and Dont's Methods: Explain/Verbal Responses: Reinforcements needed Electronic Signature(s) Signed: 02/22/2021 11:40:57 AM By: Deon Pilling RN, BSN Entered By: Deon Pilling on 02/22/2021 09:54:12 -------------------------------------------------------------------------------- Wound Assessment Details Patient Name: Date of Service: Kelli Hudson [facility] 02/22/2021 9:45 A M Medical Record Number: [id number] Patient Account Number: [id number] Date of Birth/Sex: Treating RN: 19-Feb-1990 (31  y.o. Debby Bud Primary Care Keyuna Cuthrell: Arlester Marker Other Clinician: Referring Saddie Sandeen: Treating Nery Kalisz/Extender: Fermin Schwab in Treatment: 1 Wound Status Wound Number: 1 Primary Etiology: Diabetic Wound/Ulcer of the Lower Extremity Wound Location: Left, Dorsal Foot Wound Status: Open Wounding Event: Gradually Appeared Comorbid History: Anemia, Type  I Diabetes, Neuropathy Date Acquired: 01/04/2021 Weeks Of Treatment: 1 Clustered Wound: No Photos Wound Measurements Length: (cm) 7.8 Width: (cm) 4 Depth: (cm) 0.1 Area: (cm) 24.504 Volume: (cm) 2.45 % Reduction in Area: 5.2% % Reduction in Volume: 5.2% Epithelialization: Small (1-33%) Tunneling: No Undermining: No Wound Description Classification: Grade 1 Wound Margin: Distinct, outline attached Exudate Amount: Medium Exudate Type: Serosanguineous Exudate Color: red, brown Foul Odor After Cleansing: No Slough/Fibrino Yes Wound Bed Granulation Amount: Small (1-33%) Exposed Structure Granulation Quality: Red Fascia Exposed: No Necrotic Amount: Large (67-100%) Fat Layer (Subcutaneous Tissue) Exposed: Yes Necrotic Quality: Eschar, Adherent Slough Tendon Exposed: No Muscle Exposed: No Joint Exposed: No Bone Exposed: No Treatment Notes Wound #1 (Foot) Wound Laterality: Dorsal, Left Cleanser Soap and Water Discharge Instruction: May shower and wash wound with dial antibacterial soap and water prior to dressing change. Peri-Wound Care Topical Primary Dressing Santyl Ointment Discharge Instruction: Apply nickel thick amount to wound bed as instructed Secondary Dressing ABD Pad, 5x9 Discharge Instruction: Apply over primary dressing as directed. Secured With [employer], 4.5x3.1 (in/yd) Discharge Instruction: Secure with Kerlix as directed. 36M Medipore H Soft Cloth Surgical T ape, 4 x 10 (in/yd) Discharge Instruction: Secure with tape as directed. Compression  Wrap Compression Stockings Add-Ons Electronic Signature(s) Signed: 02/22/2021 11:40:57 AM By: Deon Pilling RN, BSN Entered By: Deon Pilling on 02/22/2021 09:53:50 -------------------------------------------------------------------------------- Wound Assessment Details Patient Name: Date of Service: Kelli Hudson [facility] 02/22/2021 9:45 A M Medical Record Number: [id number] Patient Account Number: [id number] Date of Birth/Sex: Treating RN: 03-02-1990 (31 y.o. [name], Meta.Reding Primary Care Muskaan Smet: Arlester Marker Other Clinician: Referring Latoya Diskin: Treating Avalon Coppinger/Extender: Fermin Schwab in Treatment: 1 Wound Status Wound Number: 2 Primary Etiology: Diabetic Wound/Ulcer of the Lower Extremity Wound Location: Left, Lateral Foot Wound Status: Open Wounding Event: Blister Comorbid History: Anemia, Type I Diabetes, Neuropathy Date Acquired: 01/04/2021 Weeks Of Treatment: 1 Clustered Wound: No Photos Wound Measurements Length: (cm) 3.5 Width: (cm) 2 Depth: (cm) 0.1 Area: (cm) 5.498 Volume: (cm) 0.55 % Reduction in Area: 45.4% % Reduction in Volume: 45.3% Epithelialization: None Tunneling: No Undermining: Yes Starting Position (o'clock): 1 Ending Position (o'clock): 6 Maximum Distance: (cm) 0.2 Wound Description Classification: Grade 1 Wound Margin: Distinct, outline attached Exudate Amount: Medium Exudate Type: Serosanguineous Exudate Color: red, brown Foul Odor After Cleansing: No Slough/Fibrino No Wound Bed Granulation Amount: Small (1-33%) Exposed Structure Granulation Quality: Red, Friable Fascia Exposed: No Necrotic Amount: Large (67-100%) Fat Layer (Subcutaneous Tissue) Exposed: Yes Necrotic Quality: Eschar, Adherent Slough Tendon Exposed: No Muscle Exposed: No Joint Exposed: No Bone Exposed: No Treatment Notes Wound #2 (Foot) Wound Laterality: Left, Lateral Cleanser Soap and Water Discharge Instruction: May shower and  wash wound with dial antibacterial soap and water prior to dressing change. Peri-Wound Care Topical Primary Dressing Santyl Ointment Discharge Instruction: Apply nickel thick amount to wound bed as instructed Secondary Dressing ABD Pad, 5x9 Discharge Instruction: Apply over primary dressing as directed. Secured With [employer], 4.5x3.1 (in/yd) Discharge Instruction: Secure with Kerlix as directed. 36M Medipore H Soft Cloth Surgical T ape, 4 x 10 (in/yd) Discharge Instruction: Secure with tape as directed. Compression Wrap Compression Stockings Add-Ons Electronic Signature(s) Signed: 02/22/2021 11:40:57 AM By: Deon Pilling RN, BSN Entered By: Deon Pilling on 02/22/2021 09:54:23 -------------------------------------------------------------------------------- Wound Assessment Details Patient Name: Date of Service: Kelli Hudson [facility] 02/22/2021 9:45 A M Medical Record Number: [id number] Patient Account Number: [id number] Date of Birth/Sex: Treating RN: Aug 11, 1990 (  31 y.o. [name], Meta.Reding Primary Care Climmie Cronce: Arlester Marker Other Clinician: Referring Coralee Edberg: Treating Barney Gertsch/Extender: Fermin Schwab in Treatment: 1 Wound Status Wound Number: 3 Primary Etiology: Diabetic Wound/Ulcer of the Lower Extremity Wound Location: Left T Third oe Wound Status: Open Wounding Event: Gradually Appeared Comorbid History: Anemia, Type I Diabetes, Neuropathy Date Acquired: 01/04/2021 Weeks Of Treatment: 1 Clustered Wound: No Photos Wound Measurements Length: (cm) 0.1 Width: (cm) 0.1 Depth: (cm) 0.1 Area: (cm) 0.008 Volume: (cm) 0.001 % Reduction in Area: 93.7% % Reduction in Volume: 92.3% Epithelialization: Large (67-100%) Tunneling: No Undermining: No Wound Description Classification: Grade 1 Wound Margin: Distinct, outline attached Exudate Amount: None Present Foul Odor After Cleansing: No Slough/Fibrino Yes Wound Bed Granulation  Amount: None Present (0%) Exposed Structure Necrotic Amount: None Present (0%) Fascia Exposed: No Fat Layer (Subcutaneous Tissue) Exposed: Yes Tendon Exposed: No Muscle Exposed: No Joint Exposed: No Bone Exposed: No Treatment Notes Wound #3 (Toe Third) Wound Laterality: Left Cleanser Soap and Water Discharge Instruction: May shower and wash wound with dial antibacterial soap and water prior to dressing change. Peri-Wound Care Topical Primary Dressing Hydrofera Blue Ready Foam, 2.5 x2.5 in Discharge Instruction: Apply to wound bed as instructed Secondary Dressing Woven Gauze Sponge, Non-Sterile 4x4 in Discharge Instruction: Apply over primary dressing as directed. Secured With 64M Medipore H Soft Cloth Surgical T ape, 4 x 10 (in/yd) Discharge Instruction: Secure with tape as directed. Compression Wrap Compression Stockings Add-Ons Electronic Signature(s) Signed: 02/22/2021 11:40:57 AM By: Deon Pilling RN, BSN Entered By: Deon Pilling on 02/22/2021 09:55:10 -------------------------------------------------------------------------------- Vitals Details Patient Name: Date of Service: Kelli Hudson [facility] 02/22/2021 9:45 A M Medical Record Number: [id number] Patient Account Number: [id number] Date of Birth/Sex: Treating RN: Jul 08, 1990 (31 y.o. [name], [name] Primary Care Roxana Lai: Arlester Marker Other Clinician: Referring Makaiyah Schweiger: Treating Talma Aguillard/Extender: Fermin Schwab in Treatment: 1 Vital Signs Time Taken: 09:40 Temperature (F): 98.3 Height (in): 68 Pulse (bpm): 106 Weight (lbs): 183 Respiratory Rate (breaths/min): 16 Body Mass Index (BMI): 27.8 Blood Pressure (mmHg): 102/68 Capillary Blood Glucose (mg/dl): 214 Reference Range: 80 - 120 mg / dl Electronic Signature(s) Signed: 02/22/2021 11:40:57 AM By: Deon Pilling RN, BSN Entered By: Deon Pilling on 02/22/2021 09:50:32

## 2021-02-24 ENCOUNTER — Inpatient Hospital Stay (HOSPITAL_COMMUNITY)
Admission: EM | Admit: 2021-02-24 | Discharge: 2021-02-28 | DRG: 074 | Disposition: A | Discharge status: Home / Self Care | Payer: 59 | Attending: Internal Medicine | Admitting: Internal Medicine

## 2021-02-24 ENCOUNTER — Other Ambulatory Visit: Payer: Self-pay

## 2021-02-24 ENCOUNTER — Encounter (HOSPITAL_COMMUNITY): Payer: Self-pay | Admitting: Emergency Medicine

## 2021-02-24 DIAGNOSIS — Z8711 Personal history of peptic ulcer disease: Secondary | ICD-10-CM

## 2021-02-24 DIAGNOSIS — E1110 Type 2 diabetes mellitus with ketoacidosis without coma: Secondary | ICD-10-CM

## 2021-02-24 DIAGNOSIS — I10 Essential (primary) hypertension: Secondary | ICD-10-CM | POA: Diagnosis present

## 2021-02-24 DIAGNOSIS — Z794 Long term (current) use of insulin: Secondary | ICD-10-CM

## 2021-02-24 DIAGNOSIS — E0590 Thyrotoxicosis, unspecified without thyrotoxic crisis or storm: Secondary | ICD-10-CM | POA: Diagnosis present

## 2021-02-24 DIAGNOSIS — Z833 Family history of diabetes mellitus: Secondary | ICD-10-CM

## 2021-02-24 DIAGNOSIS — R739 Hyperglycemia, unspecified: Secondary | ICD-10-CM

## 2021-02-24 DIAGNOSIS — S91302A Unspecified open wound, left foot, initial encounter: Secondary | ICD-10-CM | POA: Diagnosis present

## 2021-02-24 DIAGNOSIS — E1010 Type 1 diabetes mellitus with ketoacidosis without coma: Secondary | ICD-10-CM | POA: Diagnosis present

## 2021-02-24 DIAGNOSIS — K3184 Gastroparesis: Secondary | ICD-10-CM | POA: Diagnosis present

## 2021-02-24 DIAGNOSIS — K219 Gastro-esophageal reflux disease without esophagitis: Secondary | ICD-10-CM | POA: Diagnosis present

## 2021-02-24 DIAGNOSIS — E1143 Type 2 diabetes mellitus with diabetic autonomic (poly)neuropathy: Secondary | ICD-10-CM

## 2021-02-24 DIAGNOSIS — Z79899 Other long term (current) drug therapy: Secondary | ICD-10-CM | POA: Diagnosis not present

## 2021-02-24 DIAGNOSIS — Z20822 Contact with and (suspected) exposure to covid-19: Secondary | ICD-10-CM | POA: Diagnosis present

## 2021-02-24 DIAGNOSIS — Z89422 Acquired absence of other left toe(s): Secondary | ICD-10-CM | POA: Diagnosis not present

## 2021-02-24 DIAGNOSIS — R112 Nausea with vomiting, unspecified: Secondary | ICD-10-CM | POA: Diagnosis not present

## 2021-02-24 DIAGNOSIS — N179 Acute kidney failure, unspecified: Secondary | ICD-10-CM | POA: Diagnosis present

## 2021-02-24 DIAGNOSIS — E1043 Type 1 diabetes mellitus with diabetic autonomic (poly)neuropathy: Secondary | ICD-10-CM | POA: Diagnosis present

## 2021-02-24 DIAGNOSIS — E0910 Drug or chemical induced diabetes mellitus with ketoacidosis without coma: Secondary | ICD-10-CM | POA: Diagnosis not present

## 2021-02-24 HISTORY — DX: Type 2 diabetes mellitus with ketoacidosis without coma: E11.10

## 2021-02-24 LAB — CBC WITH DIFFERENTIAL/PLATELET
Abs Immature Granulocytes: 0.03 10*3/uL (ref 0.00–0.07)
Basophils Absolute: 0 10*3/uL (ref 0.0–0.1)
Basophils Relative: 1 %
Eosinophils Absolute: 0.1 10*3/uL (ref 0.0–0.5)
Eosinophils Relative: 1 %
HCT: 38.1 % (ref 36.0–46.0)
Hemoglobin: 12.7 g/dL (ref 12.0–15.0)
Immature Granulocytes: 0 %
Lymphocytes Relative: 22 %
Lymphs Abs: 1.9 10*3/uL (ref 0.7–4.0)
MCH: 29.4 pg (ref 26.0–34.0)
MCHC: 33.3 g/dL (ref 30.0–36.0)
MCV: 88.2 fL (ref 80.0–100.0)
Monocytes Absolute: 0.4 10*3/uL (ref 0.1–1.0)
Monocytes Relative: 4 %
Neutro Abs: 6.4 10*3/uL (ref 1.7–7.7)
Neutrophils Relative %: 72 %
Platelets: 508 10*3/uL — ABNORMAL HIGH (ref 150–400)
RBC: 4.32 MIL/uL (ref 3.87–5.11)
RDW: 13.4 % (ref 11.5–15.5)
WBC: 8.8 10*3/uL (ref 4.0–10.5)
nRBC: 0 % (ref 0.0–0.2)

## 2021-02-24 LAB — CBC
HCT: 36.8 % (ref 36.0–46.0)
Hemoglobin: 12.2 g/dL (ref 12.0–15.0)
MCH: 29 pg (ref 26.0–34.0)
MCHC: 33.2 g/dL (ref 30.0–36.0)
MCV: 87.6 fL (ref 80.0–100.0)
Platelets: 468 10*3/uL — ABNORMAL HIGH (ref 150–400)
RBC: 4.2 MIL/uL (ref 3.87–5.11)
RDW: 13.3 % (ref 11.5–15.5)
WBC: 11.4 10*3/uL — ABNORMAL HIGH (ref 4.0–10.5)
nRBC: 0 % (ref 0.0–0.2)

## 2021-02-24 LAB — BLOOD GAS, VENOUS
Acid-base deficit: 4.6 mmol/L — ABNORMAL HIGH (ref 0.0–2.0)
Bicarbonate: 22 mmol/L (ref 20.0–28.0)
O2 Saturation: 63.5 %
Patient temperature: 98.6
pCO2, Ven: 49.1 mmHg (ref 44.0–60.0)
pH, Ven: 7.273 (ref 7.250–7.430)
pO2, Ven: 39 mmHg (ref 32.0–45.0)

## 2021-02-24 LAB — CBG MONITORING, ED
Glucose-Capillary: 496 mg/dL — ABNORMAL HIGH (ref 70–99)
Glucose-Capillary: 484 mg/dL — ABNORMAL HIGH (ref 70–99)
Glucose-Capillary: 456 mg/dL — ABNORMAL HIGH (ref 70–99)
Glucose-Capillary: 421 mg/dL — ABNORMAL HIGH (ref 70–99)
Glucose-Capillary: 451 mg/dL — ABNORMAL HIGH (ref 70–99)
Glucose-Capillary: 348 mg/dL — ABNORMAL HIGH (ref 70–99)
Glucose-Capillary: 372 mg/dL — ABNORMAL HIGH (ref 70–99)
Glucose-Capillary: 282 mg/dL — ABNORMAL HIGH (ref 70–99)
Glucose-Capillary: 235 mg/dL — ABNORMAL HIGH (ref 70–99)

## 2021-02-24 LAB — RENAL FUNCTION PANEL
Albumin: 3.5 g/dL (ref 3.5–5.0)
Anion gap: 16 — ABNORMAL HIGH (ref 5–15)
BUN: 35 mg/dL — ABNORMAL HIGH (ref 6–20)
CO2: 19 mmol/L — ABNORMAL LOW (ref 22–32)
Calcium: 9.9 mg/dL (ref 8.9–10.3)
Chloride: 99 mmol/L (ref 98–111)
Creatinine, Ser: 1.64 mg/dL — ABNORMAL HIGH (ref 0.44–1.00)
GFR, Estimated: 43 mL/min — ABNORMAL LOW (ref >60)
Glucose, Bld: 513 mg/dL (ref 70–99)
Phosphorus: 5.1 mg/dL — ABNORMAL HIGH (ref 2.5–4.6)
Potassium: 4.7 mmol/L (ref 3.5–5.1)
Sodium: 134 mmol/L — ABNORMAL LOW (ref 135–145)

## 2021-02-24 LAB — BASIC METABOLIC PANEL
Anion gap: 12 (ref 5–15)
Anion gap: 11 (ref 5–15)
BUN: 37 mg/dL — ABNORMAL HIGH (ref 6–20)
BUN: 33 mg/dL — ABNORMAL HIGH (ref 6–20)
CO2: 21 mmol/L — ABNORMAL LOW (ref 22–32)
CO2: 20 mmol/L — ABNORMAL LOW (ref 22–32)
Calcium: 9.8 mg/dL (ref 8.9–10.3)
Calcium: 8.8 mg/dL — ABNORMAL LOW (ref 8.9–10.3)
Chloride: 97 mmol/L — ABNORMAL LOW (ref 98–111)
Chloride: 106 mmol/L (ref 98–111)
Creatinine, Ser: 1.91 mg/dL — ABNORMAL HIGH (ref 0.44–1.00)
Creatinine, Ser: 1.54 mg/dL — ABNORMAL HIGH (ref 0.44–1.00)
GFR, Estimated: 36 mL/min — ABNORMAL LOW (ref >60)
GFR, Estimated: 47 mL/min — ABNORMAL LOW (ref >60)
Glucose, Bld: 557 mg/dL (ref 70–99)
Glucose, Bld: 391 mg/dL — ABNORMAL HIGH (ref 70–99)
Potassium: 4.6 mmol/L (ref 3.5–5.1)
Potassium: 3.9 mmol/L (ref 3.5–5.1)
Sodium: 130 mmol/L — ABNORMAL LOW (ref 135–145)
Sodium: 137 mmol/L (ref 135–145)

## 2021-02-24 LAB — RESP PANEL BY RT-PCR (FLU A&B, COVID) ARPGX2
Influenza A by PCR: NEGATIVE
Influenza B by PCR: NEGATIVE
SARS Coronavirus 2 by RT PCR: NEGATIVE

## 2021-02-24 LAB — HCG, QUANTITATIVE, PREGNANCY: hCG, Beta Chain, Quant, S: 1 m[IU]/mL (ref <5)

## 2021-02-24 LAB — GLUCOSE, CAPILLARY: Glucose-Capillary: 198 mg/dL — ABNORMAL HIGH (ref 70–99)

## 2021-02-24 MED ORDER — LACTATED RINGERS IV SOLN: INTRAVENOUS | Status: DC

## 2021-02-24 MED ORDER — DEXTROSE 50 % IV SOLN: 0.0000 mL | INTRAVENOUS | Status: DC | PRN

## 2021-02-24 MED ORDER — ENOXAPARIN SODIUM 40 MG/0.4ML IJ SOSY
40.0000 mg | PREFILLED_SYRINGE | INTRAMUSCULAR | Status: DC
  Administered 2021-02-24 – 2021-02-27 (×4): 40 mg via SUBCUTANEOUS
  Filled 2021-02-24 (×3): qty 0.4

## 2021-02-24 MED ORDER — DROPERIDOL 2.5 MG/ML IJ SOLN
2.5000 mg | Freq: Once | INTRAMUSCULAR | Status: AC
  Administered 2021-02-24: 2.5 mg via INTRAVENOUS
  Filled 2021-02-24: qty 2

## 2021-02-24 MED ORDER — DEXTROSE IN LACTATED RINGERS 5 % IV SOLN: INTRAVENOUS | Status: DC

## 2021-02-24 MED ORDER — METOCLOPRAMIDE HCL 5 MG/ML IJ SOLN
10.0000 mg | Freq: Once | INTRAMUSCULAR | Status: AC
  Administered 2021-02-24: 10 mg via INTRAVENOUS
  Filled 2021-02-24: qty 2

## 2021-02-24 MED ORDER — ONDANSETRON HCL 4 MG/2ML IJ SOLN
4.0000 mg | Freq: Once | INTRAMUSCULAR | Status: AC
  Administered 2021-02-24: 4 mg via INTRAVENOUS
  Filled 2021-02-24: qty 2

## 2021-02-24 MED ORDER — LACTATED RINGERS IV BOLUS
1000.0000 mL | Freq: Once | INTRAVENOUS | Status: AC
  Administered 2021-02-24: 1000 mL via INTRAVENOUS

## 2021-02-24 MED ORDER — INSULIN REGULAR(HUMAN) IN NACL 100-0.9 UT/100ML-% IV SOLN
INTRAVENOUS | Status: DC
  Administered 2021-02-24: 10 [IU]/h via INTRAVENOUS

## 2021-02-24 MED ORDER — HYDRALAZINE HCL 20 MG/ML IJ SOLN: 10.0000 mg | Freq: Three times a day (TID) | INTRAMUSCULAR | Status: DC | PRN

## 2021-02-24 MED ORDER — LACTATED RINGERS IV BOLUS
20.0000 mL/kg | Freq: Once | INTRAVENOUS | Status: AC
  Administered 2021-02-24: 1632 mL via INTRAVENOUS

## 2021-02-24 MED ORDER — CHLORHEXIDINE GLUCONATE CLOTH 2 % EX PADS
6.0000 | MEDICATED_PAD | Freq: Every day | CUTANEOUS | Status: DC
  Administered 2021-02-25 – 2021-02-28 (×5): 6 via TOPICAL

## 2021-02-24 MED ORDER — LACTATED RINGERS IV BOLUS
1000.0000 mL | Freq: Once | INTRAVENOUS | Status: AC
  Administered 2021-02-25: 1000 mL via INTRAVENOUS

## 2021-02-24 MED ORDER — NALOXONE HCL 0.4 MG/ML IJ SOLN
0.4000 mg | INTRAMUSCULAR | Status: DC | PRN
  Administered 2021-02-25 (×2): 0.4 mg via INTRAVENOUS
  Filled 2021-02-24 (×2): qty 1

## 2021-02-24 MED ORDER — HYDROMORPHONE HCL 1 MG/ML IJ SOLN
1.0000 mg | INTRAMUSCULAR | Status: DC | PRN
  Administered 2021-02-24 (×2): 1 mg via INTRAVENOUS
  Filled 2021-02-24 (×2): qty 1

## 2021-02-24 MED ORDER — INSULIN REGULAR(HUMAN) IN NACL 100-0.9 UT/100ML-% IV SOLN
INTRAVENOUS | Status: DC
  Administered 2021-02-24: 8.5 [IU]/h via INTRAVENOUS
  Filled 2021-02-24: qty 100

## 2021-02-24 MED ORDER — HYDROMORPHONE HCL 1 MG/ML IJ SOLN
1.0000 mg | Freq: Once | INTRAMUSCULAR | Status: AC
  Administered 2021-02-24: 1 mg via INTRAVENOUS
  Filled 2021-02-24: qty 1

## 2021-02-24 MED ORDER — METOCLOPRAMIDE HCL 5 MG/ML IJ SOLN
10.0000 mg | Freq: Three times a day (TID) | INTRAMUSCULAR | Status: DC
  Administered 2021-02-24 – 2021-02-28 (×12): 10 mg via INTRAVENOUS
  Filled 2021-02-24 (×12): qty 2

## 2021-02-24 MED ORDER — INSULIN ASPART 100 UNIT/ML IJ SOLN
10.0000 [IU] | Freq: Once | INTRAMUSCULAR | Status: AC
  Administered 2021-02-24: 10 [IU] via SUBCUTANEOUS
  Filled 2021-02-24: qty 0.1

## 2021-02-24 MED ORDER — POTASSIUM CHLORIDE 10 MEQ/100ML IV SOLN
10.0000 meq | INTRAVENOUS | Status: AC
  Administered 2021-02-24 – 2021-02-25 (×2): 10 meq via INTRAVENOUS
  Filled 2021-02-24: qty 100

## 2021-02-24 MED ORDER — PROCHLORPERAZINE EDISYLATE 10 MG/2ML IJ SOLN
10.0000 mg | Freq: Four times a day (QID) | INTRAMUSCULAR | Status: DC | PRN
  Administered 2021-02-24: 10 mg via INTRAVENOUS
  Filled 2021-02-24: qty 2

## 2021-02-24 MED ORDER — DIPHENHYDRAMINE HCL 50 MG/ML IJ SOLN
25.0000 mg | Freq: Once | INTRAMUSCULAR | Status: AC
  Administered 2021-02-24: 25 mg via INTRAVENOUS
  Filled 2021-02-24: qty 1

## 2021-02-24 NOTE — ED Provider Notes (Signed)
[facility] DEPT Provider Note   CSN: [id number] Arrival date & time: 02/24/21  0913     History  Chief Complaint  Patient presents with   Abdominal Pain    Kelli Hudson is a 31 y.o. female with history of diabetes, frequent diabetic gastroparesis, GERD hypertension who presents to the ED for evaluation of gastroparesis flareup.  Pain is described as sharp, diffusely the abdomen although worse in the epigastric region.  Patient states symptoms started last night.  She has been vomiting multiple times without any obvious blood.  She has had 1 episode of diarrhea this morning.  She has taken her Zofran without any relief.  She denies fevers, chills, cough, congestion and back pain.   Abdominal Pain Associated symptoms: diarrhea and vomiting   Associated symptoms: no fever and no shortness of breath       Home Medications Prior to Admission medications   Medication Sig Start Date End Date Taking? Authorizing Provider  albuterol (VENTOLIN HFA) 108 (90 Base) MCG/ACT inhaler Inhale 2 puffs into the lungs every 6 (six) hours as needed for shortness of breath.    [provider]  atenolol (TENORMIN) 25 MG tablet Take 1 tablet (25 mg total) by mouth 2 (two) times daily. 01/30/20   Mariel Aloe, MD  Blood Glucose Monitoring Suppl (TRUE METRIX METER) w/Device KIT 1 Device by Does not apply route 4 (four) times daily. 07/05/19   Libby Maw, MD  calcium gluconate 500 MG tablet Take 1 tablet (500 mg total) by mouth 3 (three) times daily. 09/24/20   [name], MD  collagenase (SANTYL) ointment Apply 1 application topically daily. 01/22/21   Trula Slade, DPM  Continuous Blood Gluc Receiver (DEXCOM G6 RECEIVER) DEVI USE AS DIRECTED 11/21/20   [name], MD  Continuous Blood Gluc Sensor (DEXCOM G6 SENSOR) MISC 1 each by Does not apply route daily. 11/21/20   [name], MD  Continuous Blood Gluc Transmit (DEXCOM G6  TRANSMITTER) MISC 1 each by Does not apply route daily. 09/24/20   [name], MD  doxycycline (VIBRA-TABS) 100 MG tablet Take 1 tablet (100 mg total) by mouth 2 (two) times daily. 02/01/21   Trula Slade, DPM  famotidine (PEPCID) 20 MG tablet Take 1 tablet (20 mg total) by mouth 2 (two) times daily. 01/21/21   [name], MD  glucose blood (TRUE METRIX BLOOD GLUCOSE TEST) test strip Use as instructed 09/03/20   [name], MD  HYDROcodone-acetaminophen (NORCO/VICODIN) 5-325 MG tablet Take 1 tablet by mouth every 6 (six) hours as needed. 12/28/20   Trula Slade, DPM  hyoscyamine (LEVBID) 0.375 MG 12 hr tablet Take 1 tablet (0.375 mg total) by mouth every 12 (twelve) hours as needed for cramping (abdominal pain). 05/14/20   Raiford Noble Latif, DO  insulin glargine (LANTUS) 100 UNIT/ML injection Inject 0.18 mLs (18 Units total) into the skin daily. Patient taking differently: Inject 18 Units into the skin at bedtime. 08/15/20   Aline August, MD  Insulin Pen Needle 31G X 5 MM MISC 1 Device by Does not apply route QID. For use with insulin pens 07/05/19   Libby Maw, MD  insulin regular (NOVOLIN R) 100 units/mL injection Inject 0-0.15 mLs (0-15 Units total) into the skin 3 (three) times daily before meals. 01/30/21   Shamleffer, Melanie Crazier, MD  lisinopril (ZESTRIL) 10 MG tablet Take 1 tablet (10 mg total) by mouth daily. 09/27/20   Rudd,  Lillette Boxer, MD  metoCLOPramide (REGLAN) 10 MG tablet Take 1 tablet (10 mg total) by mouth 3 (three) times daily before meals. 10/17/20   [name], MD  Multiple Vitamin (MULTIVITAMIN WITH MINERALS) TABS tablet Take 1 tablet by mouth daily.    [provider]  mupirocin ointment (BACTROBAN) 2 % Apply 1 application topically 2 (two) times daily. 12/04/20   Trula Slade, DPM  pantoprazole (PROTONIX) 40 MG tablet Take 40 mg by mouth daily. 09/17/20   [provider]  promethazine (PHENERGAN) 12.5 MG tablet Take  1 tablet (12.5 mg total) by mouth every 6 (six) hours as needed for nausea or vomiting. 05/14/20   Raiford Noble [place], DO  TRUEPLUS LANCETS 28G MISC assist with checking blood sugar TID and qhs 09/10/15   Zettie Pho S, PA-C  insulin aspart (NOVOLOG) 100 UNIT/ML FlexPen Inject 5 Units into the skin 3 (three) times daily with meals. 02/23/18 07/05/19  Donne Hazel, MD      Allergies    Patient has no known allergies.    Review of Systems   Review of Systems  Constitutional:  Negative for fever.  HENT: Negative.    Eyes: Negative.   Respiratory:  Negative for shortness of breath.   Cardiovascular: Negative.   Gastrointestinal:  Positive for abdominal pain, diarrhea and vomiting.  Endocrine: Negative.   Genitourinary: Negative.   Musculoskeletal: Negative.   Skin:  Negative for rash.  Neurological:  Negative for headaches.  All other systems reviewed and are negative.  Physical Exam Updated Vital Signs BP (!) 130/104 Comment: has not had BP meds today   Pulse (!) 113    Temp 98.5 F (36.9 C) (Oral)    Resp 20    Ht 5' 8"  (1.727 m)    Wt 81.6 kg    SpO2 100%    BMI 27.37 kg/m  Physical Exam Vitals and nursing note reviewed.  Constitutional:      General: She is not in acute distress.    Appearance: She is ill-appearing.     Comments: Patient lying in fetal position in obvious discomfort, tearful  HENT:     Head: Atraumatic.  Eyes:     Conjunctiva/sclera: Conjunctivae normal.  Cardiovascular:     Rate and Rhythm: Normal rate and regular rhythm.     Pulses: Normal pulses.     Heart sounds: No murmur heard.    Comments: 2+ DP pulses Pulmonary:     Effort: Pulmonary effort is normal. No respiratory distress.     Breath sounds: Normal breath sounds.  Abdominal:     General: Abdomen is flat. There is no distension.     Palpations: Abdomen is soft.     Tenderness: There is generalized abdominal tenderness and tenderness in the epigastric area.     Comments: Abdomen is soft  nondistended, diffusely tender to palpation, significantly worse in epigastric region.  Musculoskeletal:        General: Normal range of motion.     Cervical back: Normal range of motion.  Skin:    General: Skin is warm and dry.     Capillary Refill: Capillary refill takes less than 2 seconds.  Neurological:     General: No focal deficit present.     Mental Status: She is alert.  Psychiatric:        Mood and Affect: Mood normal.    ED Results / Procedures / Treatments   Labs (all labs ordered are listed, but only abnormal  results are displayed) Labs Reviewed  BASIC METABOLIC PANEL - Abnormal; Notable for the following components:      Result Value   Sodium 130 (*)    Chloride 97 (*)    CO2 21 (*)    Glucose, Bld 557 (*)    BUN 37 (*)    Creatinine, Ser 1.91 (*)    GFR, Estimated 36 (*)    All other components within normal limits  CBC WITH DIFFERENTIAL/PLATELET - Abnormal; Notable for the following components:   Platelets 508 (*)    All other components within normal limits  BLOOD GAS, VENOUS - Abnormal; Notable for the following components:   Acid-base deficit 4.6 (*)    All other components within normal limits  CBG MONITORING, ED - Abnormal; Notable for the following components:   Glucose-Capillary 496 (*)    All other components within normal limits  CBG MONITORING, ED - Abnormal; Notable for the following components:   Glucose-Capillary 484 (*)    All other components within normal limits  CBG MONITORING, ED - Abnormal; Notable for the following components:   Glucose-Capillary 456 (*)    All other components within normal limits  HCG, QUANTITATIVE, PREGNANCY  RENAL FUNCTION PANEL    EKG None  Radiology No results found.  Procedures .Critical Care Performed by: Tonye Pearson, PA-C Authorized by: Tonye Pearson, PA-C   Critical care provider statement:    Critical care time (minutes):  30   Critical care start time:  02/24/2021 11:13 AM   Critical  care end time:  02/24/2021 12:00 PM   Critical care was necessary to treat or prevent imminent or life-threatening deterioration of the following conditions:  Endocrine crisis, metabolic crisis and dehydration   Critical care was time spent personally by me on the following activities:  Development of treatment plan with patient or surrogate, discussions with consultants, evaluation of patient's response to treatment, examination of patient, ordering and review of laboratory studies, ordering and performing treatments and interventions, pulse oximetry, re-evaluation of patient's condition and review of old charts   I assumed direction of critical care for this patient from another provider in my specialty: no     Care discussed with: admitting provider      Medications Ordered in ED Medications  insulin regular, human (MYXREDLIN) 100 units/ 100 mL infusion (8.5 Units/hr Intravenous New Bag/Given 02/24/21 1708)  lactated ringers infusion ( Intravenous New Bag/Given 02/24/21 1706)  dextrose 5 % in lactated ringers infusion (has no administration in time range)  dextrose 50 % solution 0-50 mL (has no administration in time range)  lactated ringers bolus 1,000 mL (0 mLs Intravenous Stopped 02/24/21 1422)  metoCLOPramide (REGLAN) injection 10 mg (10 mg Intravenous Given 02/24/21 1119)  ondansetron (ZOFRAN) injection 4 mg (4 mg Intravenous Given 02/24/21 1119)  HYDROmorphone (DILAUDID) injection 1 mg (1 mg Intravenous Given 02/24/21 1132)  droperidol (INAPSINE) 2.5 MG/ML injection 2.5 mg (2.5 mg Intravenous Given 02/24/21 1422)  diphenhydrAMINE (BENADRYL) injection 25 mg (25 mg Intravenous Given 02/24/21 1453)  insulin aspart (novoLOG) injection 10 Units (10 Units Subcutaneous Given 02/24/21 1507)    ED Course/ Medical Decision Making/ A&P Clinical Course as of 02/24/21 1717  Sun Feb 24, 2021  7408 Basic metabolic panel(!!) BMP with glucose of 557.  VBG ordered.  Patient about to complete her first bag of fluids,  will recheck CBG once complete. [EC]  1226 CBC with Differential(!) CBC normal [EC]  1226 hCG, quantitative, pregnancy Negative pregnancy test [EC]  1301 pH, Ven: 7.273 [EC]  [id number] Spoke with Dr. Marylyn Ishihara the hospitalists who is very familiar with patient.  He recommends patient receive insulin to prevent her from going into DKA. [EC]    Clinical Course User Index [EC] Tonye Pearson, PA-C                           Medical Decision Making  This patient presents to the ED for concern of abdominal pain similar to previous gastroparesis flare ups, this involves an extensive number of treatment options, and is a complaint that carries with it a high risk of complications and morbidity.   The differential diagnosis for generalized abdominal pain includes, but is not limited to AAA, gastroenteritis, appendicitis, Bowel obstruction, Bowel perforation. Gastroparesis, DKA, Hernia, Inflammatory bowel disease, mesenteric ischemia, pancreatitis, peritonitis SBP, volvulus.    Additional history obtained:  External records from outside source obtained and reviewed including previous medication protocols that helped her gastroparesis, discharge instructions, previous labs and imaging   Lab Tests:  I Ordered, reviewed, and interpreted labs.  The pertinent results include: CBC unremarkable, negative pregnancy, BMP with glucose of 557, normal anion gap, VBG without evidence of acidosis.   Imaging Studies ordered:  No imaging indicated at this time  Cardiac Monitoring:  The patient was maintained on a cardiac monitor.  I personally viewed and interpreted the cardiac monitored which showed an underlying rhythm of: NSR   Medicines ordered and prescription drug management:  I ordered medication including 1 L lactated ringer bolus with 10 mg Reglan IV and 4 mg Zofran IV for abdominal pain, nausea and vomiting Reevaluation of the patient after these medicines showed that the patient stayed the same I  then administered 2.5 mg droperidol without significant provement on reevaluation, she was continuing to vomit, feel nauseous and have abdominal pain.  She was given 1 mg IV Dilaudid with only minimal improvement in pain. I have reviewed the patients home medicines and have made adjustments as needed   Critical Interventions:  Hyperglycemia management with diabetic gastroparesis   Consultations Obtained:  I requested consultation with hospitalist,  and discussed lab and imaging findings as well as pertinent plan - they recommend:  I spoke with Dr. Marylyn Ishihara and the hospitalist team who recommended patient receive insulin as her diabetic gastroparesis is likely secondary to her current hyperglycemia.  I gave her 10 units insulin with only minimal improvement in blood glucose.  Endo tool protocol initiated and patient was started on insulin infusion.   Reevaluation:  After the interventions noted above, I reevaluated the patient and found that they have :improved   Dispostion:  After consideration of the diagnostic results and the patients response to treatment feel that the patent would benefit from admission.   Hyperglycemia with diabetic gastroparesis-given patient's acuity of symptoms and lack of over all response to medical intervention for her gastroparesis, she requires admission for further monitoring.  Dr. Marylyn Ishihara agrees to admit the patient and he will determine ultimate treatment and disposition.  Final Clinical Impression(s) / ED Diagnoses Final diagnoses:  Hyperglycemia  Diabetic gastroparesis [facility])    Rx / DC Orders ED Discharge Orders     None         Tonye Pearson, PA-C 02/24/21 1718    Teressa Lower, MD 02/25/21 [phone]

## 2021-02-24 NOTE — H&P (Signed)
History and Physical    Kelli Hudson XTA:[id number] DOB: 08/19/1990 DOA: 02/24/2021  PCP: [name], MD  Patient coming from: Home  Chief Complaint: N/V  HPI: Kelli Hudson is a 31 y.o. female with medical history significant of DMt1, gastroparesis, HTN. Presenting with N/V. Her symptoms started last night acutely. She reports no change in diet. She reports compliance with her medication. She had not had any sick contacts. Her vomiting continued through this morning. She was not able to control it with her normal medications. She became concerned so she decided to come to the ED.   ED Course: She was given fluids and anti-emetics. Her nausea was not controlled. Her glucose was noted to be 557. She was not yet in DKA, but was started on hyperglycemia ENDOTOOL protocol. [facility] was called for admission.   Review of Systems:  Denies CP, palpitations, dyspnea, lightheadedness, dizziness. Reports global ab pain, N/V. Review of systems is otherwise negative for all not mentioned in HPI.   PMHx Past Medical History:  Diagnosis Date   Acute H. pylori gastric ulcer    Diabetes mellitus (Kelli Hudson)    Diabetic gastroparesis (Kelli Hudson)    Gastroparesis    GERD (gastroesophageal reflux disease)    Hypertension    Hyperthyroidism     PSHx Past Surgical History:  Procedure Laterality Date   AMPUTATION TOE Left 03/10/2018   Procedure: AMPUTATION FIFTH TOE;  Surgeon: Trula Slade, DPM;  Location: [facility];  Service: Podiatry;  Laterality: Left;   BIOPSY  01/28/2020   Procedure: BIOPSY;  Surgeon: Kelli Brace, MD;  Location: [facility];  Service: Gastroenterology;;   ESOPHAGOGASTRODUODENOSCOPY N/A 01/28/2020   Procedure: ESOPHAGOGASTRODUODENOSCOPY (EGD);  Surgeon: Kelli Brace, MD;  Location: [facility];  Service: Gastroenterology;  Laterality: N/A;   ESOPHAGOGASTRODUODENOSCOPY (EGD) WITH PROPOFOL Left 09/08/2015   Procedure: ESOPHAGOGASTRODUODENOSCOPY  (EGD) WITH PROPOFOL;  Surgeon: Kelli Silence, MD;  Location: [facility] ENDOSCOPY;  Service: Endoscopy;  Laterality: Left;   WISDOM TOOTH EXTRACTION      SocHx  reports that she has never smoked. She has never used smokeless tobacco. She reports current alcohol use of about 1.0 standard drink per week. She reports that she does not use drugs.  No Known Allergies  FamHx Family History  Problem Relation Age of Onset   Lung cancer Mother    Diabetes Mother    Bipolar disorder Father    Liver cancer Maternal Grandfather    Breast cancer Other        maternal great aunt   Heart disease Other    Ovarian cancer Other        maternal great aunt   Pancreatic cancer Maternal Uncle    Prostate cancer Maternal Uncle    Diabetes Maternal Aunt        x 2   Kidney disease Other        maternal great aunt    Prior to Admission medications   Medication Sig Start Date End Date Taking? Authorizing Provider  albuterol (VENTOLIN HFA) 108 (90 Base) MCG/ACT inhaler Inhale 2 puffs into the lungs every 6 (six) hours as needed for shortness of breath.   Yes [provider]  atenolol (TENORMIN) 25 MG tablet Take 1 tablet (25 mg total) by mouth 2 (two) times daily. 01/30/20  Yes Mariel Aloe, MD  Blood Glucose Monitoring Suppl (TRUE METRIX METER) w/Device KIT 1 Device by Does not apply route 4 (four) times daily. 07/05/19  Yes Kelli Maw, MD  Continuous Blood Gluc Receiver (DEXCOM G6 RECEIVER) DEVI USE AS DIRECTED Patient taking differently: 1 each by Other route as directed. 11/21/20  Yes [name], MD  Continuous Blood Gluc Sensor (DEXCOM G6 SENSOR) MISC 1 each by Does not apply route daily. 11/21/20  Yes [name], MD  Continuous Blood Gluc Transmit (DEXCOM G6 TRANSMITTER) MISC 1 each by Does not apply route daily. 09/24/20  Yes [name], MD  famotidine (PEPCID) 20 MG tablet Take 1 tablet (20 mg total) by mouth 2 (two) times daily. 01/21/21  Yes [name], MD   glucose blood (TRUE METRIX BLOOD GLUCOSE TEST) test strip Use as instructed Patient taking differently: 1 each by Other route See admin instructions. Use as instructed 09/03/20  Yes Hudson, Kelli Boxer, MD  hyoscyamine (LEVBID) 0.375 MG 12 hr tablet Take 1 tablet (0.375 mg total) by mouth every 12 (twelve) hours as needed for cramping (abdominal pain). 05/14/20  Yes Hudson, Kelli Latif, DO  insulin glargine (LANTUS) 100 UNIT/ML injection Inject 0.18 mLs (18 Units total) into the skin daily. Patient taking differently: Inject 18 Units into the skin at bedtime. 08/15/20  Yes Aline August, MD  Insulin Pen Needle 31G X 5 MM MISC 1 Device by Does not apply route QID. For use with insulin pens 07/05/19  Yes Kelli Maw, MD  insulin regular (NOVOLIN R) 100 units/mL injection Inject 0-0.15 mLs (0-15 Units total) into the skin 3 (three) times daily before meals. 01/30/21  Yes Shamleffer, Melanie Crazier, MD  lisinopril (ZESTRIL) 10 MG tablet Take 1 tablet (10 mg total) by mouth daily. 09/27/20  Yes [name], MD  pantoprazole (PROTONIX) 40 MG tablet Take 40 mg by mouth daily. 09/17/20  Yes [provider]  promethazine (PHENERGAN) 12.5 MG tablet Take 1 tablet (12.5 mg total) by mouth every 6 (six) hours as needed for nausea or vomiting. 05/14/20  Yes Hudson, Kelli Latif, DO  [place] assist with checking blood sugar TID and qhs Patient taking differently: 1 each by Other route See admin instructions. assist with checking blood sugar TID and qhs 09/10/15  Yes Ena Hudson, Kelli S, PA-C  calcium gluconate 500 MG tablet Take 1 tablet (500 mg total) by mouth 3 (three) times daily. Patient not taking: Reported on 02/24/2021 09/24/20   [name], MD  collagenase (SANTYL) ointment Apply 1 application topically daily. Patient not taking: Reported on 02/24/2021 01/22/21   Trula Slade, DPM  doxycycline (VIBRA-TABS) 100 MG tablet Take 1 tablet (100 mg total) by mouth 2 (two) times  daily. Patient not taking: Reported on 02/24/2021 02/01/21   Trula Slade, DPM  HYDROcodone-acetaminophen (NORCO/VICODIN) 5-325 MG tablet Take 1 tablet by mouth every 6 (six) hours as needed. Patient not taking: Reported on 02/24/2021 12/28/20   Trula Slade, DPM  metoCLOPramide (REGLAN) 10 MG tablet Take 1 tablet (10 mg total) by mouth 3 (three) times daily before meals. Patient not taking: Reported on 02/24/2021 10/17/20   [name], MD  mupirocin ointment (BACTROBAN) 2 % Apply 1 application topically 2 (two) times daily. Patient not taking: Reported on 02/24/2021 12/04/20   Trula Slade, DPM  insulin aspart (NOVOLOG) 100 UNIT/ML FlexPen Inject 5 Units into the skin 3 (three) times daily with meals. 02/23/18 07/05/19  Donne Hazel, MD    Physical Exam: Vitals:   02/24/21 1130 02/24/21 1230 02/24/21 1330 02/24/21 1430  BP: (!) 126/104 (!) 162/96 (!) 178/119 (!) 170/110  Pulse: (!) 114 (!) 114 (!) 121 (!) 123  Resp: 19 15 16 14   Temp:      TempSrc:      SpO2: 99% 95% 97% 97%  Weight:      Height:        General: 32 y.o. female resting in bed in NAD Eyes: PERRL, normal sclera ENMT: Nares patent w/o discharge, orophaynx clear, dentition normal, ears w/o discharge/lesions/ulcers Neck: Supple, trachea midline Cardiovascular: tachy, +S1, S2, no m/g/r, equal pulses throughout Respiratory: CTABL, no w/r/r, normal WOB GI: BS+, ND, TTP globally, no masses noted, no organomegaly noted MSK: No e/c/c Neuro: A&O x 3, no focal deficits Psyc: Appropriate interaction and affect, calm/cooperative  Labs on Admission: I have personally reviewed following labs and imaging studies  CBC: Recent Labs  Lab 02/24/21 1007  WBC 8.8  NEUTROABS 6.4  HGB 12.7  HCT 38.1  MCV 88.2  PLT 628*   Basic Metabolic Panel: Recent Labs  Lab 02/24/21 1007  NA 130*  K 4.6  CL 97*  CO2 21*  GLUCOSE 557*  BUN 37*  CREATININE 1.91*  CALCIUM 9.8   GFR: Estimated Creatinine Clearance:  48.7 mL/min (A) (by C-G formula based on SCr of 1.91 mg/dL (H)). Liver Function Tests: No results for input(s): AST, ALT, ALKPHOS, BILITOT, PROT, ALBUMIN in the last 168 hours. No results for input(s): LIPASE, AMYLASE in the last 168 hours. No results for input(s): AMMONIA in the last 168 hours. Coagulation Profile: No results for input(s): INR, PROTIME in the last 168 hours. Cardiac Enzymes: No results for input(s): CKTOTAL, CKMB, CKMBINDEX, TROPONINI in the last 168 hours. BNP (last 3 results) No results for input(s): PROBNP in the last 8760 hours. HbA1C: No results for input(s): HGBA1C in the last 72 hours. CBG: Recent Labs  Lab 02/24/21 1427 02/24/21 1554  GLUCAP 496* 484*   Lipid Profile: No results for input(s): CHOL, HDL, LDLCALC, TRIG, CHOLHDL, LDLDIRECT in the last 72 hours. Thyroid Function Tests: No results for input(s): TSH, T4TOTAL, FREET4, T3FREE, THYROIDAB in the last 72 hours. Anemia Panel: No results for input(s): VITAMINB12, FOLATE, FERRITIN, TIBC, IRON, RETICCTPCT in the last 72 hours. Urine analysis:    Component Value Date/Time   COLORURINE YELLOW 11/16/2020 0355   APPEARANCEUR HAZY (A) 11/16/2020 0355   LABSPEC 1.034 (H) 11/16/2020 0355   PHURINE 5.0 11/16/2020 0355   GLUCOSEU 150 (A) 11/16/2020 0355   GLUCOSEU NEGATIVE 05/22/2020 1417   HGBUR NEGATIVE 11/16/2020 0355   BILIRUBINUR NEGATIVE 11/16/2020 0355   KETONESUR 20 (A) 11/16/2020 0355   PROTEINUR >=300 (A) 11/16/2020 0355   UROBILINOGEN 0.2 05/22/2020 1417   NITRITE NEGATIVE 11/16/2020 0355   LEUKOCYTESUR NEGATIVE 11/16/2020 0355    Radiological Exams on Admission: No results found.  EKG: Independently reviewed. Sinus tach, no st elevations  Assessment/Plan Intractable N/V Diabetic gastroparesis DM1 DKA     - admit to inpt, SDU     - change to DKA endotool; continue fluids, insulin gtt     - may have non-caloric fluids     - anti-emetics, pain control.  AKI     - as a fxn of  above     - fluids, follow  HTN     - resume home regimen when she comes off NPO status   DVT prophylaxis: lovenox  Code Status: FULL  Family Communication: None at bedside  Consults called: None   Status is: Observation  The patient remains OBS appropriate and will d/c before 2 midnights.  Jonnie Finner DO [facility]  If 7PM-7AM, please contact night-coverage [email or web address]  02/24/2021, 4:27 PM

## 2021-02-24 NOTE — ED Triage Notes (Signed)
Patient c/o of gastroparesis flaring up since Friday. Reports generalized abdominal pain and emesis. Patient dry heaving in triage. States she had a really big steak on Friday and thinks it caused flare up.

## 2021-02-25 DIAGNOSIS — R112 Nausea with vomiting, unspecified: Secondary | ICD-10-CM | POA: Diagnosis not present

## 2021-02-25 LAB — MRSA NEXT GEN BY PCR, NASAL: MRSA by PCR Next Gen: NOT DETECTED

## 2021-02-25 LAB — CBC
HCT: 31.6 % — ABNORMAL LOW (ref 36.0–46.0)
HCT: 31.9 % — ABNORMAL LOW (ref 36.0–46.0)
Hemoglobin: 10.7 g/dL — ABNORMAL LOW (ref 12.0–15.0)
Hemoglobin: 10.9 g/dL — ABNORMAL LOW (ref 12.0–15.0)
MCH: 29.3 pg (ref 26.0–34.0)
MCH: 29.3 pg (ref 26.0–34.0)
MCHC: 33.9 g/dL (ref 30.0–36.0)
MCHC: 34.2 g/dL (ref 30.0–36.0)
MCV: 86.6 fL (ref 80.0–100.0)
MCV: 85.8 fL (ref 80.0–100.0)
Platelets: 427 10*3/uL — ABNORMAL HIGH (ref 150–400)
Platelets: 427 10*3/uL — ABNORMAL HIGH (ref 150–400)
RBC: 3.65 MIL/uL — ABNORMAL LOW (ref 3.87–5.11)
RBC: 3.72 MIL/uL — ABNORMAL LOW (ref 3.87–5.11)
RDW: 13.6 % (ref 11.5–15.5)
RDW: 13.8 % (ref 11.5–15.5)
WBC: 13.8 10*3/uL — ABNORMAL HIGH (ref 4.0–10.5)
WBC: 14 10*3/uL — ABNORMAL HIGH (ref 4.0–10.5)
nRBC: 0 % (ref 0.0–0.2)
nRBC: 0 % (ref 0.0–0.2)

## 2021-02-25 LAB — POTASSIUM
Potassium: 4.3 mmol/L (ref 3.5–5.1)
Potassium: 3.9 mmol/L (ref 3.5–5.1)
Potassium: 3.9 mmol/L (ref 3.5–5.1)
Potassium: 4 mmol/L (ref 3.5–5.1)

## 2021-02-25 LAB — GLUCOSE, CAPILLARY
Glucose-Capillary: 122 mg/dL — ABNORMAL HIGH (ref 70–99)
Glucose-Capillary: 138 mg/dL — ABNORMAL HIGH (ref 70–99)
Glucose-Capillary: 147 mg/dL — ABNORMAL HIGH (ref 70–99)
Glucose-Capillary: 157 mg/dL — ABNORMAL HIGH (ref 70–99)
Glucose-Capillary: 166 mg/dL — ABNORMAL HIGH (ref 70–99)
Glucose-Capillary: 210 mg/dL — ABNORMAL HIGH (ref 70–99)
Glucose-Capillary: 230 mg/dL — ABNORMAL HIGH (ref 70–99)
Glucose-Capillary: 256 mg/dL — ABNORMAL HIGH (ref 70–99)
Glucose-Capillary: 295 mg/dL — ABNORMAL HIGH (ref 70–99)

## 2021-02-25 LAB — BASIC METABOLIC PANEL
Anion gap: 11 (ref 5–15)
Anion gap: 7 (ref 5–15)
BUN: 38 mg/dL — ABNORMAL HIGH (ref 6–20)
BUN: 37 mg/dL — ABNORMAL HIGH (ref 6–20)
CO2: 21 mmol/L — ABNORMAL LOW (ref 22–32)
CO2: 25 mmol/L (ref 22–32)
Calcium: 9.7 mg/dL (ref 8.9–10.3)
Calcium: 9.1 mg/dL (ref 8.9–10.3)
Chloride: 106 mmol/L (ref 98–111)
Chloride: 108 mmol/L (ref 98–111)
Creatinine, Ser: 1.94 mg/dL — ABNORMAL HIGH (ref 0.44–1.00)
Creatinine, Ser: 1.72 mg/dL — ABNORMAL HIGH (ref 0.44–1.00)
GFR, Estimated: 35 mL/min — ABNORMAL LOW (ref >60)
GFR, Estimated: 41 mL/min — ABNORMAL LOW (ref >60)
Glucose, Bld: 200 mg/dL — ABNORMAL HIGH (ref 70–99)
Glucose, Bld: 163 mg/dL — ABNORMAL HIGH (ref 70–99)
Potassium: 5.5 mmol/L — ABNORMAL HIGH (ref 3.5–5.1)
Potassium: 4.5 mmol/L (ref 3.5–5.1)
Sodium: 138 mmol/L (ref 135–145)
Sodium: 140 mmol/L (ref 135–145)

## 2021-02-25 LAB — BETA-HYDROXYBUTYRIC ACID
Beta-Hydroxybutyric Acid: 0.13 mmol/L (ref 0.05–0.27)
Beta-Hydroxybutyric Acid: 1.35 mmol/L — ABNORMAL HIGH (ref 0.05–0.27)
Beta-Hydroxybutyric Acid: 0.88 mmol/L — ABNORMAL HIGH (ref 0.05–0.27)

## 2021-02-25 LAB — HIV ANTIBODY (ROUTINE TESTING W REFLEX): HIV Screen 4th Generation wRfx: NONREACTIVE

## 2021-02-25 LAB — HEMOGLOBIN A1C
Hgb A1c MFr Bld: 9.9 % — ABNORMAL HIGH (ref 4.8–5.6)
Mean Plasma Glucose: 237.43 mg/dL

## 2021-02-25 MED ORDER — COLLAGENASE 250 UNIT/GM EX OINT
TOPICAL_OINTMENT | Freq: Every day | CUTANEOUS | Status: DC
  Filled 2021-02-25: qty 30

## 2021-02-25 MED ORDER — INSULIN ASPART 100 UNIT/ML IJ SOLN
0.0000 [IU] | Freq: Three times a day (TID) | INTRAMUSCULAR | Status: DC
  Administered 2021-02-25: 2 [IU] via SUBCUTANEOUS
  Administered 2021-02-25: 8 [IU] via SUBCUTANEOUS
  Administered 2021-02-25: 5 [IU] via SUBCUTANEOUS
  Administered 2021-02-26: 8 [IU] via SUBCUTANEOUS
  Administered 2021-02-26 – 2021-02-27 (×3): 5 [IU] via SUBCUTANEOUS
  Administered 2021-02-27: 8 [IU] via SUBCUTANEOUS
  Administered 2021-02-27: 3 [IU] via SUBCUTANEOUS
  Administered 2021-02-28: 5 [IU] via SUBCUTANEOUS
  Administered 2021-02-28: 3 [IU] via SUBCUTANEOUS

## 2021-02-25 MED ORDER — LACTATED RINGERS IV SOLN: INTRAVENOUS | Status: AC

## 2021-02-25 MED ORDER — ALUM & MAG HYDROXIDE-SIMETH 200-200-20 MG/5ML PO SUSP
30.0000 mL | Freq: Once | ORAL | Status: AC
  Administered 2021-02-25: 30 mL via ORAL
  Filled 2021-02-25: qty 30

## 2021-02-25 MED ORDER — HYOSCYAMINE SULFATE 0.125 MG SL SUBL
0.2500 mg | SUBLINGUAL_TABLET | Freq: Once | SUBLINGUAL | Status: AC
  Administered 2021-02-25: 0.25 mg via SUBLINGUAL
  Filled 2021-02-25: qty 2

## 2021-02-25 MED ORDER — INSULIN GLARGINE-YFGN 100 UNIT/ML ~~LOC~~ SOLN
12.0000 [IU] | SUBCUTANEOUS | Status: DC
  Administered 2021-02-25 – 2021-02-26 (×2): 12 [IU] via SUBCUTANEOUS
  Filled 2021-02-25 (×3): qty 0.12

## 2021-02-25 MED ORDER — PROCHLORPERAZINE EDISYLATE 10 MG/2ML IJ SOLN
5.0000 mg | Freq: Four times a day (QID) | INTRAMUSCULAR | Status: AC | PRN
  Administered 2021-02-25 – 2021-02-26 (×2): 5 mg via INTRAVENOUS
  Filled 2021-02-25 (×2): qty 2

## 2021-02-25 MED ORDER — LACTATED RINGERS IV BOLUS
500.0000 mL | Freq: Once | INTRAVENOUS | Status: AC
  Administered 2021-02-25: 500 mL via INTRAVENOUS

## 2021-02-25 MED ORDER — POTASSIUM CHLORIDE 10 MEQ/100ML IV SOLN
INTRAVENOUS | Status: AC
  Filled 2021-02-25: qty 100

## 2021-02-25 MED ORDER — HYDROMORPHONE HCL 1 MG/ML IJ SOLN
0.5000 mg | Freq: Four times a day (QID) | INTRAMUSCULAR | Status: DC | PRN
  Administered 2021-02-25 – 2021-02-26 (×5): 0.5 mg via INTRAVENOUS
  Filled 2021-02-25 (×5): qty 1

## 2021-02-25 MED ORDER — PHENYLEPHRINE HCL-NACL 20-0.9 MG/250ML-% IV SOLN: 0.0000 ug/min | INTRAVENOUS | Status: DC

## 2021-02-25 MED ORDER — AMLODIPINE BESYLATE 5 MG PO TABS
5.0000 mg | ORAL_TABLET | Freq: Every day | ORAL | Status: DC
  Administered 2021-02-25 – 2021-02-28 (×4): 5 mg via ORAL
  Filled 2021-02-25 (×4): qty 1

## 2021-02-25 MED ORDER — SODIUM CHLORIDE 0.9 % IV BOLUS
500.0000 mL | Freq: Once | INTRAVENOUS | Status: AC
  Administered 2021-02-25: 500 mL via INTRAVENOUS

## 2021-02-25 MED ORDER — HYDRALAZINE HCL 20 MG/ML IJ SOLN
10.0000 mg | Freq: Four times a day (QID) | INTRAMUSCULAR | Status: DC | PRN
  Administered 2021-02-25 (×2): 10 mg via INTRAVENOUS
  Filled 2021-02-25 (×2): qty 1

## 2021-02-25 MED ORDER — LACTATED RINGERS IV BOLUS
1000.0000 mL | Freq: Once | INTRAVENOUS | Status: AC
  Administered 2021-02-25: 1000 mL via INTRAVENOUS

## 2021-02-25 MED ORDER — ORAL CARE MOUTH RINSE
15.0000 mL | Freq: Two times a day (BID) | OROMUCOSAL | Status: DC
  Administered 2021-02-25 – 2021-02-28 (×5): 15 mL via OROMUCOSAL

## 2021-02-25 MED ORDER — OXYCODONE-ACETAMINOPHEN 5-325 MG PO TABS
1.0000 | ORAL_TABLET | Freq: Four times a day (QID) | ORAL | Status: DC | PRN
  Administered 2021-02-25 – 2021-02-28 (×6): 1 via ORAL
  Filled 2021-02-25 (×6): qty 1

## 2021-02-25 MED ORDER — LIDOCAINE VISCOUS HCL 2 % MT SOLN
15.0000 mL | Freq: Once | OROMUCOSAL | Status: AC
  Administered 2021-02-25: 15 mL via ORAL
  Filled 2021-02-25: qty 15

## 2021-02-25 MED ORDER — AMLODIPINE BESYLATE 5 MG PO TABS
5.0000 mg | ORAL_TABLET | Freq: Once | ORAL | Status: AC
  Administered 2021-02-25: 5 mg via ORAL
  Filled 2021-02-25: qty 1

## 2021-02-25 MED ORDER — ONDANSETRON HCL 4 MG PO TABS
4.0000 mg | ORAL_TABLET | Freq: Three times a day (TID) | ORAL | Status: DC | PRN
  Administered 2021-02-25 – 2021-02-26 (×3): 4 mg via ORAL
  Filled 2021-02-25 (×3): qty 1

## 2021-02-25 NOTE — Progress Notes (Signed)
Per bedside RN, additional PIV not necessary at this time, advised to reconsult for PIV placement when needed.

## 2021-02-25 NOTE — Progress Notes (Addendum)
PROGRESS NOTE    Kelli Hudson  ZOX:[id number] DOB: [birth year] DOA: 02/24/2021 PCP: Loyola Mastudd, Stephen M, MD   Brief Narrative: This 31 years old female with PMH significant for type 1 diabetes, gastroparesis, hypertension presented in the ED with complaints of nausea and vomiting.  She reports her symptoms started last night.  She report compliance with her medications.  She denies any sick contacts.  Patient reports her nausea and vomiting was intractable not getting better with over-the-counter medications.  She was found to have a blood glucose of 557 in the ER, she was started on insulin gtt. for presumed DKA. Patient is admitted for diabetic ketoacidosis and started on IV insulin as per protocol.  Assessment & Plan:   Principal Problem:   Intractable nausea and vomiting Active Problems:   DKA (diabetic ketoacidosis) (HCC)  Intractable nausea and vomiting due to diabetic gastroparesis: High anion gap metabolic acidosis secondary to DKA. Patient presented with intractable nausea and vomiting with high anion gap. Elevated blood glucose, high anion gap, metabolic acidosis Patient is started on insulin gtt. as per DKA protocol. Anion gap is closed,  now transition to subcu insulin. Started on carb modified diet.  Continue IV hydration. Continue Semglee 12 units, regular insulin sliding scale. Continue antiemetics as needed.  AKI : Her baseline serum creatinine remains between 1.0-1.2 She presented with serum creatinine 1.72.  Continue IV hydration. Avoid nephrotoxic medications, continue to monitor serum creatinine  Essential hypertension: Resume home blood pressure medications.  Left foot wound.: Wound care consult no signs of infection   DVT prophylaxis: Lovenox Code Status: Full code Family Communication: No family at bedside Disposition Plan:   Status is: Inpatient  Remains inpatient appropriate because:  Admitted for diabetic ketosis requiring IV insulin  therapy.  Now out of DKA.  Transitioned to subcu insulin. Anticipated discharge in 1 to 2 days.  Consultants:  Diabetic coordinator consult  Procedures: None .  Antimicrobials: None  Subjective: Patient is seen and examined at bedside.  Overnight events noted.   Patient reports still feeling nauseous but denies any abdominal pain and vomiting.   Her blood sugars has been improved,  She is out of DKA.  Objective: Vitals:   02/25/21 0630 02/25/21 0700 02/25/21 0800 02/25/21 0815  BP: 137/90 137/68 (!) 148/84   Pulse: (!) 115 (!) 116 (!) 120   Resp: (!) 9 16    Temp:   98 F (36.7 C) 98 F (36.7 C)  TempSrc:   Oral Oral  SpO2: 97% 97% 98%   Weight:      Height:        Intake/Output Summary (Last 24 hours) at 02/25/2021 1115 Last data filed at 02/25/2021 0859 Gross per 24 hour  Intake 3915.65 ml  Output 2050 ml  Net 1865.65 ml   Filed Weights   02/24/21 0923  Weight: 81.6 kg    Examination:  General exam: Appears comfortable, not in any acute distress. Respiratory system: Clear to auscultation bilaterally, respiratory effort normal, RR 15 Cardiovascular system: S1-S2 heard, regular rate and rhythm, no murmur. Gastrointestinal system: Abdomen is soft, nondistended, mildly tender, BS +. Central nervous system: Alert and oriented x 3 . No focal neurological deficits. Extremities: Left foot medial aspect open dry wound with no signs of infection.  No erythema Skin: No rashes, lesions or ulcers Psychiatry: Judgement and insight appear normal. Mood & affect appropriate.     Data Reviewed: I have personally reviewed following labs and imaging studies  CBC: Recent Labs  Lab 02/24/21 1007 02/24/21 1731 02/25/21 0035 02/25/21 0321  WBC 8.8 11.4* 13.8* 14.0*  NEUTROABS 6.4  --   --   --   HGB 12.7 12.2 10.7* 10.9*  HCT 38.1 36.8 31.6* 31.9*  MCV 88.2 87.6 86.6 85.8  PLT 508* 468* 427* 427*   Basic Metabolic Panel: Recent Labs  Lab 02/24/21 1007 02/24/21 1628  02/24/21 1731 02/25/21 0035 02/25/21 0316 02/25/21 0823  NA 130* 134* 137 138 140  --   K 4.6 4.7 3.9 5.5* 4.5 4.3  CL 97* 99 106 106 108  --   CO2 21* 19* 20* 21* 25  --   GLUCOSE 557* 513* 391* 200* 163*  --   BUN 37* 35* 33* 38* 37*  --   CREATININE 1.91* 1.64* 1.54* 1.94* 1.72*  --   CALCIUM 9.8 9.9 8.8* 9.7 9.1  --   PHOS  --  5.1*  --   --   --   --    GFR: Estimated Creatinine Clearance: 54.1 mL/min (A) (by C-G formula based on SCr of 1.72 mg/dL (H)). Liver Function Tests: Recent Labs  Lab 02/24/21 1628  ALBUMIN 3.5   No results for input(s): LIPASE, AMYLASE in the last 168 hours. No results for input(s): AMMONIA in the last 168 hours. Coagulation Profile: No results for input(s): INR, PROTIME in the last 168 hours. Cardiac Enzymes: No results for input(s): CKTOTAL, CKMB, CKMBINDEX, TROPONINI in the last 168 hours. BNP (last 3 results) No results for input(s): PROBNP in the last 8760 hours. HbA1C: Recent Labs    02/25/21 0316  HGBA1C 9.9*   CBG: Recent Labs  Lab 02/24/21 2348 02/25/21 0107 02/25/21 0211 02/25/21 0355 02/25/21 0742  GLUCAP 210* 166* 147* 157* 138*   Lipid Profile: No results for input(s): CHOL, HDL, LDLCALC, TRIG, CHOLHDL, LDLDIRECT in the last 72 hours. Thyroid Function Tests: No results for input(s): TSH, T4TOTAL, FREET4, T3FREE, THYROIDAB in the last 72 hours. Anemia Panel: No results for input(s): VITAMINB12, FOLATE, FERRITIN, TIBC, IRON, RETICCTPCT in the last 72 hours. Sepsis Labs: No results for input(s): PROCALCITON, LATICACIDVEN in the last 168 hours.  Recent Results (from the past 240 hour(s))  Resp Panel by RT-PCR (Flu A&B, Covid) Nasopharyngeal Swab     Status: None   Collection Time: 02/24/21  6:22 PM   Specimen: Nasopharyngeal Swab; Nasopharyngeal(NP) swabs in vial transport medium  Result Value Ref Range Status   SARS Coronavirus 2 by RT PCR NEGATIVE NEGATIVE Final    Comment: (NOTE) SARS-CoV-2 target nucleic acids  are NOT DETECTED.  The SARS-CoV-2 RNA is generally detectable in upper respiratory specimens during the acute phase of infection. The lowest concentration of SARS-CoV-2 viral copies this assay can detect is 138 copies/mL. A negative result does not preclude SARS-Cov-2 infection and should not be used as the sole basis for treatment or other patient management decisions. A negative result may occur with  improper specimen collection/handling, submission of specimen other than nasopharyngeal swab, presence of viral mutation(s) within the areas targeted by this assay, and inadequate number of viral copies(<138 copies/mL). A negative result must be combined with clinical observations, patient history, and epidemiological information. The expected result is Negative.  Fact Sheet for Patients:  BloggerCourse.com  Fact Sheet for Healthcare Providers:  SeriousBroker.it  This test is no t yet approved or cleared by the [place] FDA and  has been authorized for detection and/or diagnosis of SARS-CoV-2 by FDA under an Emergency Use Authorization (EUA). This EUA will  remain  in effect (meaning this test can be used) for the duration of the COVID-19 declaration under Section 564(b)(1) of the Act, 21 U.S.C.section 360bbb-3(b)(1), unless the authorization is terminated  or revoked sooner.       Influenza A by PCR NEGATIVE NEGATIVE Final   Influenza B by PCR NEGATIVE NEGATIVE Final    Comment: (NOTE) The Xpert Xpress SARS-CoV-2/FLU/RSV plus assay is intended as an aid in the diagnosis of influenza from Nasopharyngeal swab specimens and should not be used as a sole basis for treatment. Nasal washings and aspirates are unacceptable for Xpert Xpress SARS-CoV-2/FLU/RSV testing.  Fact Sheet for Patients: BloggerCourse.com  Fact Sheet for Healthcare Providers: SeriousBroker.it  This test is  not yet approved or cleared by the [place] FDA and has been authorized for detection and/or diagnosis of SARS-CoV-2 by FDA under an Emergency Use Authorization (EUA). This EUA will remain in effect (meaning this test can be used) for the duration of the COVID-19 declaration under Section 564(b)(1) of the Act, 21 U.S.C. section 360bbb-3(b)(1), unless the authorization is terminated or revoked.  Performed at [facility], [address] [place], [place] [address]   MRSA Next Gen by PCR, Nasal     Status: None   Collection Time: 02/24/21 10:17 PM   Specimen: Nasal Mucosa; Nasal Swab  Result Value Ref Range Status   MRSA by PCR Next Gen NOT DETECTED NOT DETECTED Final    Comment: (NOTE) The GeneXpert MRSA Assay (FDA approved for NASAL specimens only), is one component of a comprehensive MRSA colonization surveillance program. It is not intended to diagnose MRSA infection nor to guide or monitor treatment for MRSA infections. Test performance is not FDA approved in patients less than 38 years old. Performed at [facility], [address], [place], [place] [address]     Radiology Studies: No results found.  Scheduled Meds:  Chlorhexidine Gluconate Cloth  6 each Topical Daily   collagenase   Topical Daily   enoxaparin (LOVENOX) injection  40 mg Subcutaneous Q24H   insulin aspart  0-15 Units Subcutaneous TID WC   insulin glargine-yfgn  12 Units Subcutaneous Q24H   mouth rinse  15 mL Mouth Rinse BID   metoCLOPramide (REGLAN) injection  10 mg Intravenous Q8H   Continuous Infusions:  insulin Stopped (02/25/21 0627)   lactated ringers Stopped (02/25/21 0653)     LOS: 1 day    Time spent: 50 mins    Nabeel Gladson, MD [facility]   If 7PM-7AM, please contact night-coverage

## 2021-02-25 NOTE — Progress Notes (Signed)
Inpatient Diabetes Program Recommendations  AACE/ADA: New Consensus Statement on Inpatient Glycemic Control (2015)  Target Ranges:  Prepandial:   less than 140 mg/dL      Peak postprandial:   less than 180 mg/dL (1-2 hours)      Critically ill patients:  140 - 180 mg/dL   Lab Results  Component Value Date   GLUCAP 138 (H) 02/25/2021   HGBA1C 9.9 (H) 02/25/2021    Review of Glycemic Control  Diabetes history: DM1 Outpatient Diabetes medications: Lantus 18 units QHS, Novolin R 0-15 units TID with meals Current orders for Inpatient glycemic control: Semglee 18 units Q24H, Novolog 0-15 units TID with meals  HgbA1C - 9.9% - down from > 11%  Inpatient Diabetes Program Recommendations:    Add Novolog 4 units TID with meals if eating > 50% meal  Pt in process of looking at different insulin pumps and will f/u with Endo.  Continue to follow while inpatient.  Thank you. Ailene Ards, RD, LDN, CDE Inpatient Diabetes Coordinator [phone]

## 2021-02-25 NOTE — Plan of Care (Signed)
°  Problem: Respiratory: Goal: Will regain and/or maintain adequate ventilation Outcome: Progressing   Problem: Urinary Elimination: Goal: Ability to achieve and maintain adequate renal perfusion and functioning will improve Outcome: Progressing   Problem: Cardiac: Goal: Ability to maintain an adequate cardiac output will improve Outcome: Not Progressing   Problem: Health Behavior/Discharge Planning: Goal: Ability to identify and utilize available resources and services will improve Outcome: Not Progressing   Problem: Fluid Volume: Goal: Ability to achieve a balanced intake and output will improve Outcome: Not Progressing   Problem: Metabolic: Goal: Ability to maintain appropriate glucose levels will improve Outcome: Not Progressing

## 2021-02-25 NOTE — Consult Note (Signed)
WOC Nurse Consult Note: Reason for Consult: Left dorsal foot wound, chronic, nonhealing. Followed by the outpatient wound care center at [facility] by Dr. Mikey Bussing.  Last seen on 02/22/21.  Consulted for wound care guidance.  I will continue Dr. Neita Garnet POC while in house. Wound type: Neuropathic, trauma Pressure Injury POA: N/A Measurement: per Dr. Neita Garnet last encounter: 7.8cm x 4cm x 0.1cm Wound bed: 70% nonviable tissue at last WCC visit, 30% red Drainage (amount, consistency, odor) Small serous Periwound: intact, discolored with evidence of previous wound healing Dressing procedure/placement/frequency: POC per Dr. Mikey Bussing at the outpatient [facility] is continued in house.  Daily cleansing with NS, pat dry.  Application of collagenase (Santyl) enzymatic debriding agent applied to the wound bed in a 1/8 inch thick layer and topped with saline moistened gauze dressing (opened).  This is to be topped with dry gauze and secured with a few turns of Kerlix roll gauze/paper tape.  Heels are to be floated for PI prevention.  WOC nursing team will not follow, but will remain available to this patient, the nursing and medical teams.  Please re-consult if needed. Thanks, Ladona Mow, MSN, RN, GNP, [place]  Pager# [phone]

## 2021-02-26 DIAGNOSIS — R112 Nausea with vomiting, unspecified: Secondary | ICD-10-CM | POA: Diagnosis not present

## 2021-02-26 LAB — BASIC METABOLIC PANEL
Anion gap: 12 (ref 5–15)
Anion gap: 11 (ref 5–15)
BUN: 26 mg/dL — ABNORMAL HIGH (ref 6–20)
BUN: 30 mg/dL — ABNORMAL HIGH (ref 6–20)
CO2: 24 mmol/L (ref 22–32)
CO2: 25 mmol/L (ref 22–32)
Calcium: 9.1 mg/dL (ref 8.9–10.3)
Calcium: 9.3 mg/dL (ref 8.9–10.3)
Chloride: 102 mmol/L (ref 98–111)
Chloride: 101 mmol/L (ref 98–111)
Creatinine, Ser: 1.3 mg/dL — ABNORMAL HIGH (ref 0.44–1.00)
Creatinine, Ser: 1.25 mg/dL — ABNORMAL HIGH (ref 0.44–1.00)
GFR, Estimated: 57 mL/min — ABNORMAL LOW (ref >60)
GFR, Estimated: 60 mL/min — ABNORMAL LOW (ref >60)
Glucose, Bld: 303 mg/dL — ABNORMAL HIGH (ref 70–99)
Glucose, Bld: 202 mg/dL — ABNORMAL HIGH (ref 70–99)
Potassium: 4.1 mmol/L (ref 3.5–5.1)
Potassium: 3.7 mmol/L (ref 3.5–5.1)
Sodium: 138 mmol/L (ref 135–145)
Sodium: 137 mmol/L (ref 135–145)

## 2021-02-26 LAB — GLUCOSE, CAPILLARY
Glucose-Capillary: 244 mg/dL — ABNORMAL HIGH (ref 70–99)
Glucose-Capillary: 222 mg/dL — ABNORMAL HIGH (ref 70–99)
Glucose-Capillary: 255 mg/dL — ABNORMAL HIGH (ref 70–99)
Glucose-Capillary: 137 mg/dL — ABNORMAL HIGH (ref 70–99)

## 2021-02-26 LAB — POTASSIUM
Potassium: 4 mmol/L (ref 3.5–5.1)
Potassium: 3.7 mmol/L (ref 3.5–5.1)
Potassium: 3.6 mmol/L (ref 3.5–5.1)
Potassium: 3.8 mmol/L (ref 3.5–5.1)
Potassium: 4 mmol/L (ref 3.5–5.1)

## 2021-02-26 MED ORDER — MELATONIN 3 MG PO TABS
3.0000 mg | ORAL_TABLET | Freq: Every day | ORAL | Status: AC
  Administered 2021-02-26 (×2): 3 mg via ORAL
  Filled 2021-02-26 (×2): qty 1

## 2021-02-26 MED ORDER — PROCHLORPERAZINE EDISYLATE 10 MG/2ML IJ SOLN
5.0000 mg | Freq: Four times a day (QID) | INTRAMUSCULAR | Status: AC | PRN
  Administered 2021-02-26 – 2021-02-27 (×3): 5 mg via INTRAVENOUS
  Filled 2021-02-26 (×3): qty 2

## 2021-02-26 MED ORDER — HYDROMORPHONE HCL 1 MG/ML IJ SOLN
0.5000 mg | INTRAMUSCULAR | Status: DC | PRN
  Administered 2021-02-26: 0.5 mg via INTRAVENOUS
  Filled 2021-02-26: qty 1

## 2021-02-26 MED ORDER — INSULIN GLARGINE-YFGN 100 UNIT/ML ~~LOC~~ SOLN
12.0000 [IU] | SUBCUTANEOUS | Status: DC
  Administered 2021-02-27 – 2021-02-28 (×2): 12 [IU] via SUBCUTANEOUS
  Filled 2021-02-26 (×2): qty 0.12

## 2021-02-26 MED ORDER — HYDROMORPHONE HCL 1 MG/ML IJ SOLN
1.0000 mg | INTRAMUSCULAR | Status: DC | PRN
  Administered 2021-02-26 – 2021-02-28 (×9): 1 mg via INTRAVENOUS
  Filled 2021-02-26 (×9): qty 1

## 2021-02-26 MED ORDER — MELATONIN 3 MG PO TABS: 3.0000 mg | ORAL_TABLET | Freq: Every day | ORAL | Status: DC

## 2021-02-26 MED ORDER — METOPROLOL TARTRATE 12.5 MG HALF TABLET
12.5000 mg | ORAL_TABLET | Freq: Two times a day (BID) | ORAL | Status: DC
  Administered 2021-02-27 – 2021-02-28 (×3): 12.5 mg via ORAL
  Filled 2021-02-26 (×4): qty 1

## 2021-02-26 NOTE — Progress Notes (Signed)
Inpatient Diabetes Program Recommendations  AACE/ADA: New Consensus Statement on Inpatient Glycemic Control (2015)  Target Ranges:  Prepandial:   less than 140 mg/dL      Peak postprandial:   less than 180 mg/dL (1-2 hours)      Critically ill patients:  140 - 180 mg/dL   Lab Results  Component Value Date   GLUCAP 244 (H) 02/26/2021   HGBA1C 9.9 (H) 02/25/2021    Review of Glycemic Control  Diabetes history: DM1 Outpatient Diabetes medications: Lantus 18 units QHS, Novolin R 0-15 TID Current orders for Inpatient glycemic control: Semglee 12 units Q24H, Novolog 0-15 units TID  HgbA1C - 9.9% (average blood sugar of 237 mg/dL)  Received 15 units of Novolog correction yesterday.  Not eating - still nauseated and vomiting.   Inpatient Diabetes Program Recommendations:    Increase Semglee to 14 units QHS  Change Novolog to 0-15 units Q4H while nauseated  Continue to follow.   Thank you. Ailene Ards, RD, LDN, CDE Inpatient Diabetes Coordinator [phone]

## 2021-02-26 NOTE — Progress Notes (Signed)
Updated mom per patient permission

## 2021-02-26 NOTE — Plan of Care (Signed)
Discussed with patient plan of care for the evening, pain management and control nausea with some teach back displayed.  Patient requested rest tonight and clustering care.  Problem: Education: Goal: Ability to describe self-care measures that may prevent or decrease complications (Diabetes Survival Skills Education) will improve Outcome: Progressing

## 2021-02-26 NOTE — Progress Notes (Signed)
PROGRESS NOTE    Kelli Hudson  YIR:[id number] DOB: 27-Apr-1990 DOA: 02/24/2021 PCP: Loyola Mast, MD   Brief Narrative: This 31 years old female with PMH significant for type 1 diabetes, gastroparesis, hypertension presented in the ED with complaints of nausea and vomiting. She reports her symptoms started last night.  She report compliance with her medications.  She denies any sick contacts.  Patient reports her nausea and vomiting was intractable, not getting better with over-the-counter medications.  She was found to have a blood glucose of 557 in the ER, she was started on insulin gtt. for presumed DKA. Patient is admitted for diabetic ketoacidosis and started on IV insulin as per protocol.  Anion gap resolved, transition to subcu insulin.  Patient continues to have ongoing nausea and vomiting requiring Compazine.  Assessment & Plan:   Principal Problem:   Intractable nausea and vomiting Active Problems:   DKA (diabetic ketoacidosis) (HCC)  Intractable nausea and vomiting due to diabetic gastroparesis: High anion gap metabolic acidosis secondary to DKA. Patient presented with intractable nausea and vomiting with high anion gap. Elevated blood glucose, high anion gap, metabolic acidosis Patient was started on insulin gtt. as per DKA protocol. Anion gap is closed,  now transitioned to subcu insulin. Started on carb modified diet.  Continue IV hydration. Continue Semglee 12 units, regular insulin sliding scale. Continue antiemetics as needed. She continues to remain nauseous and throwing up.  AKI : Her baseline serum creatinine remains between 1.0-1.2 She presented with serum creatinine 1.72.  Continue IV hydration. Avoid nephrotoxic medications, continue to monitor serum creatinine  Essential hypertension: Continue amlodipine daily, hydralazine as needed.  Left foot wound.: Wound care consult,  no signs of infection. Continue collagenase.   DVT prophylaxis:  Lovenox Code Status: Full code Family Communication: No family at bedside Disposition Plan:   Status is: Inpatient  Remains inpatient appropriate because:  Admitted for diabetic ketosis requiring IV insulin therapy.  Now out of DKA.  Transitioned to subcu insulin.  Anticipated discharge home on 02/27/2021  Consultants:  Diabetic coordinator consult  Procedures: None .  Antimicrobials: None  Subjective: Patient is seen and examined at bedside.  Overnight events noted.   Patient still reports feeling nauseous and throwing up.  She reports abdominal pain has improved. Her blood sugars has been improved,  She is out of DKA.  Objective: Vitals:   02/26/21 1000 02/26/21 1100 02/26/21 1125 02/26/21 1200  BP:   136/75   Pulse: (!) 134 (!) 115  (!) 119  Resp: 19 10  16   Temp:      TempSrc:      SpO2: 98% 96%  97%  Weight:      Height:        Intake/Output Summary (Last 24 hours) at 02/26/2021 1210 Last data filed at 02/25/2021 2244 Gross per 24 hour  Intake 229.42 ml  Output 1575 ml  Net -1345.58 ml   Filed Weights   02/24/21 0923  Weight: 81.6 kg    Examination:  General exam: Appears comfortable, not in any acute distress. Respiratory system: Clear to auscultation bilaterally, respiratory effort normal, RR 15 Cardiovascular system: S1-S2 heard, regular rate and rhythm, no murmur. Gastrointestinal system: Abdomen is soft, nondistended, mildly tender, BS+ Central nervous system: Alert and oriented x 3 . No focal neurological deficits. Extremities: Left foot : medial aspect open dry wound with no signs of infection.  No erythema Skin: No rashes, lesions or ulcers Psychiatry: Judgement and insight appear normal. Mood &  affect appropriate.     Data Reviewed: I have personally reviewed following labs and imaging studies  CBC: Recent Labs  Lab 02/24/21 1007 02/24/21 1731 02/25/21 0035 02/25/21 0321  WBC 8.8 11.4* 13.8* 14.0*  NEUTROABS 6.4  --   --   --   HGB  12.7 12.2 10.7* 10.9*  HCT 38.1 36.8 31.6* 31.9*  MCV 88.2 87.6 86.6 85.8  PLT 508* 468* 427* 427*   Basic Metabolic Panel: Recent Labs  Lab 02/24/21 1628 02/24/21 1731 02/25/21 0035 02/25/21 0316 02/25/21 0823 02/25/21 1813 02/25/21 2259 02/26/21 0259 02/26/21 0629 02/26/21 1017  NA 134* 137 138 140  --   --   --  138  --   --   K 4.7 3.9 5.5* 4.5   < > 3.9 4.0 4.1 4.0 3.7  CL 99 106 106 108  --   --   --  102  --   --   CO2 19* 20* 21* 25  --   --   --  24  --   --   GLUCOSE 513* 391* 200* 163*  --   --   --  303*  --   --   BUN 35* 33* 38* 37*  --   --   --  26*  --   --   CREATININE 1.64* 1.54* 1.94* 1.72*  --   --   --  1.30*  --   --   CALCIUM 9.9 8.8* 9.7 9.1  --   --   --  9.1  --   --   PHOS 5.1*  --   --   --   --   --   --   --   --   --    < > = values in this interval not displayed.   GFR: Estimated Creatinine Clearance: 71.6 mL/min (A) (by C-G formula based on SCr of 1.3 mg/dL (H)). Liver Function Tests: Recent Labs  Lab 02/24/21 1628  ALBUMIN 3.5   No results for input(s): LIPASE, AMYLASE in the last 168 hours. No results for input(s): AMMONIA in the last 168 hours. Coagulation Profile: No results for input(s): INR, PROTIME in the last 168 hours. Cardiac Enzymes: No results for input(s): CKTOTAL, CKMB, CKMBINDEX, TROPONINI in the last 168 hours. BNP (last 3 results) No results for input(s): PROBNP in the last 8760 hours. HbA1C: Recent Labs    02/25/21 0316  HGBA1C 9.9*   CBG: Recent Labs  Lab 02/25/21 1120 02/25/21 1526 02/25/21 2137 02/26/21 0748 02/26/21 1113  GLUCAP 230* 295* 256* 244* 222*   Lipid Profile: No results for input(s): CHOL, HDL, LDLCALC, TRIG, CHOLHDL, LDLDIRECT in the last 72 hours. Thyroid Function Tests: No results for input(s): TSH, T4TOTAL, FREET4, T3FREE, THYROIDAB in the last 72 hours. Anemia Panel: No results for input(s): VITAMINB12, FOLATE, FERRITIN, TIBC, IRON, RETICCTPCT in the last 72 hours. Sepsis  Labs: No results for input(s): PROCALCITON, LATICACIDVEN in the last 168 hours.  Recent Results (from the past 240 hour(s))  Resp Panel by RT-PCR (Flu A&B, Covid) Nasopharyngeal Swab     Status: None   Collection Time: 02/24/21  6:22 PM   Specimen: Nasopharyngeal Swab; Nasopharyngeal(NP) swabs in vial transport medium  Result Value Ref Range Status   SARS Coronavirus 2 by RT PCR NEGATIVE NEGATIVE Final    Comment: (NOTE) SARS-CoV-2 target nucleic acids are NOT DETECTED.  The SARS-CoV-2 RNA is generally detectable in upper respiratory specimens during the acute phase of infection.  The lowest concentration of SARS-CoV-2 viral copies this assay can detect is 138 copies/mL. A negative result does not preclude SARS-Cov-2 infection and should not be used as the sole basis for treatment or other patient management decisions. A negative result may occur with  improper specimen collection/handling, submission of specimen other than nasopharyngeal swab, presence of viral mutation(s) within the areas targeted by this assay, and inadequate number of viral copies(<138 copies/mL). A negative result must be combined with clinical observations, patient history, and epidemiological information. The expected result is Negative.  Fact Sheet for Patients:  BloggerCourse.comhttps://www.fda.gov/media/152166/download  Fact Sheet for Healthcare Providers:  SeriousBroker.ithttps://www.fda.gov/media/152162/download  This test is no t yet approved or cleared by the [place] FDA and  has been authorized for detection and/or diagnosis of SARS-CoV-2 by FDA under an Emergency Use Authorization (EUA). This EUA will remain  in effect (meaning this test can be used) for the duration of the COVID-19 declaration under Section 564(b)(1) of the Act, 21 U.S.C.section 360bbb-3(b)(1), unless the authorization is terminated  or revoked sooner.       Influenza A by PCR NEGATIVE NEGATIVE Final   Influenza B by PCR NEGATIVE NEGATIVE Final     Comment: (NOTE) The Xpert Xpress SARS-CoV-2/FLU/RSV plus assay is intended as an aid in the diagnosis of influenza from Nasopharyngeal swab specimens and should not be used as a sole basis for treatment. Nasal washings and aspirates are unacceptable for Xpert Xpress SARS-CoV-2/FLU/RSV testing.  Fact Sheet for Patients: BloggerCourse.comhttps://www.fda.gov/media/152166/download  Fact Sheet for Healthcare Providers: SeriousBroker.ithttps://www.fda.gov/media/152162/download  This test is not yet approved or cleared by the [place] FDA and has been authorized for detection and/or diagnosis of SARS-CoV-2 by FDA under an Emergency Use Authorization (EUA). This EUA will remain in effect (meaning this test can be used) for the duration of the COVID-19 declaration under Section 564(b)(1) of the Act, 21 U.S.C. section 360bbb-3(b)(1), unless the authorization is terminated or revoked.  Performed at [facility], [address] [place], [place] [address]   MRSA Next Gen by PCR, Nasal     Status: None   Collection Time: 02/24/21 10:17 PM   Specimen: Nasal Mucosa; Nasal Swab  Result Value Ref Range Status   MRSA by PCR Next Gen NOT DETECTED NOT DETECTED Final    Comment: (NOTE) The GeneXpert MRSA Assay (FDA approved for NASAL specimens only), is one component of a comprehensive MRSA colonization surveillance program. It is not intended to diagnose MRSA infection nor to guide or monitor treatment for MRSA infections. Test performance is not FDA approved in patients less than 30 years old. Performed at [facility], [address] [place], [place] [address]     Radiology Studies: No results found.  Scheduled Meds:  amLODipine  5 mg Oral Daily   Chlorhexidine Gluconate Cloth  6 each Topical Daily   collagenase   Topical Daily   enoxaparin (LOVENOX) injection  40 mg Subcutaneous Q24H   insulin aspart  0-15 Units Subcutaneous TID WC   insulin glargine-yfgn  12 Units  Subcutaneous Q24H   mouth rinse  15 mL Mouth Rinse BID   melatonin  3 mg Oral QHS   metoCLOPramide (REGLAN) injection  10 mg Intravenous Q8H   Continuous Infusions:  insulin Stopped (02/25/21 0627)     LOS: 2 days    Time spent: 35 mins    Nicoya Friel, MD [facility]   If 7PM-7AM, please contact night-coverage

## 2021-02-27 DIAGNOSIS — R112 Nausea with vomiting, unspecified: Secondary | ICD-10-CM

## 2021-02-27 DIAGNOSIS — M79676 Pain in unspecified toe(s): Secondary | ICD-10-CM

## 2021-02-27 LAB — GLUCOSE, CAPILLARY
Glucose-Capillary: 203 mg/dL — ABNORMAL HIGH (ref 70–99)
Glucose-Capillary: 185 mg/dL — ABNORMAL HIGH (ref 70–99)
Glucose-Capillary: 260 mg/dL — ABNORMAL HIGH (ref 70–99)
Glucose-Capillary: 148 mg/dL — ABNORMAL HIGH (ref 70–99)

## 2021-02-27 LAB — BASIC METABOLIC PANEL
Anion gap: 9 (ref 5–15)
BUN: 34 mg/dL — ABNORMAL HIGH (ref 6–20)
CO2: 27 mmol/L (ref 22–32)
Calcium: 9.1 mg/dL (ref 8.9–10.3)
Chloride: 101 mmol/L (ref 98–111)
Creatinine, Ser: 2 mg/dL — ABNORMAL HIGH (ref 0.44–1.00)
GFR, Estimated: 34 mL/min — ABNORMAL LOW (ref >60)
Glucose, Bld: 160 mg/dL — ABNORMAL HIGH (ref 70–99)
Potassium: 4 mmol/L (ref 3.5–5.1)
Sodium: 137 mmol/L (ref 135–145)

## 2021-02-27 LAB — POTASSIUM
Potassium: 4 mmol/L (ref 3.5–5.1)
Potassium: 3.9 mmol/L (ref 3.5–5.1)
Potassium: 3.9 mmol/L (ref 3.5–5.1)

## 2021-02-27 MED ORDER — SODIUM CHLORIDE 0.9 % IV SOLN: INTRAVENOUS | Status: DC

## 2021-02-27 NOTE — Progress Notes (Signed)
PROGRESS NOTE    Kelli Hudson  JTT:[id number] DOB: [birth year] DOA: 02/24/2021 PCP: Loyola Mast, MD    Brief Narrative:  31 years old female with PMH significant for type 1 diabetes, gastroparesis, hypertension presented in the ED with complaints of nausea and vomiting. She reports her symptoms started last night.  She report compliance with her medications.  She denies any sick contacts.  Patient reports her nausea and vomiting was intractable, not getting better with over-the-counter medications.  She was found to have a blood glucose of 557 in the ER, she was started on insulin gtt. for presumed DKA. Patient is admitted for diabetic ketoacidosis and started on IV insulin as per protocol.  Anion gap resolved, transition to subcu insulin.  Patient continues to have ongoing nausea and vomiting and decreased tolerance to PO intake  Assessment & Plan:   Principal Problem:   Intractable nausea and vomiting Active Problems:   DKA (diabetic ketoacidosis) (HCC)  Intractable nausea and vomiting due to diabetic gastroparesis: High anion gap metabolic acidosis secondary to DKA. Patient presented with intractable nausea and vomiting with high anion gap. Elevated blood glucose, high anion gap, metabolic acidosis Patient was started on insulin gtt. as per DKA protocol. Anion gap is closed,  now transitioned to subcu insulin. Started on carb modified diet.  Continue IV hydration. Continue Semglee 12 units, regular insulin sliding scale. Continue antiemetics as needed. Diet was officially advanced to carb mod/heart healthy, however pt is not tolerating solid PO, instead only taking liquids still Continue reglan as tolerated   AKI : Her baseline serum creatinine remains between 1.0-1.2 She presented with serum creatinine 1.72.   Avoid nephrotoxic medications, continue to monitor serum creatinine Cr up to 2.0 today with pt unsteady on her feet Continue IVF hydration   Essential  hypertension: Continue amlodipine daily, hydralazine as needed. BP currently stable   Left foot wound.: Wound care consulted,  no signs of infection. Continue collagenase.   DVT prophylaxis: Lovenox subq Code Status: Full Family Communication: Pt in room, family not at bedside  Status is: Inpatient  Remains inpatient appropriate because: Severity of illness   Consultants:    Procedures:    Antimicrobials: Anti-infectives (From admission, onward)    None       Subjective: Complaining of intolerance to solid food this AM, sticking with liquids only  Objective: Vitals:   02/27/21 1500 02/27/21 1600 02/27/21 1700 02/27/21 1703  BP:      Pulse: 94 92 92   Resp: 10 13 12    Temp:    98 F (36.7 C)  TempSrc:    Oral  SpO2: 97% 99% 100%   Weight:      Height:        Intake/Output Summary (Last 24 hours) at 02/27/2021 1731 Last data filed at 02/27/2021 0900 Gross per 24 hour  Intake 210 ml  Output 450 ml  Net -240 ml   Filed Weights   02/24/21 0923  Weight: 81.6 kg    Examination: General exam: Awake, laying in bed, in nad Respiratory system: Normal respiratory effort, no wheezing Cardiovascular system: regular rate, s1, s2 Gastrointestinal system: Soft, nondistended, positive BS Central nervous system: CN2-12 grossly intact, strength intact Extremities: Perfused, no clubbing Skin: Normal skin turgor, no notable skin lesions seen Psychiatry: Mood normal // no visual hallucinations   Data Reviewed: I have personally reviewed following labs and imaging studies  CBC: Recent Labs  Lab 02/24/21 1007 02/24/21 1731 02/25/21 0035 02/25/21 0321  WBC  8.8 11.4* 13.8* 14.0*  NEUTROABS 6.4  --   --   --   HGB 12.7 12.2 10.7* 10.9*  HCT 38.1 36.8 31.6* 31.9*  MCV 88.2 87.6 86.6 85.8  PLT 508* 468* 427* 427*   Basic Metabolic Panel: Recent Labs  Lab 02/24/21 1628 02/24/21 1731 02/25/21 0035 02/25/21 0316 02/25/21 0823 02/26/21 0259 02/26/21 0629  02/26/21 1017 02/26/21 1438 02/26/21 1816 02/26/21 2223 02/27/21 0245 02/27/21 1409  NA 134*   < > 138 140  --  138  --  137  --   --   --  137  --   K 4.7   < > 5.5* 4.5   < > 4.1   < > 3.7   3.7 3.6 3.8 4.0 4.0   4.0 3.9  CL 99   < > 106 108  --  102  --  101  --   --   --  101  --   CO2 19*   < > 21* 25  --  24  --  25  --   --   --  27  --   GLUCOSE 513*   < > 200* 163*  --  303*  --  202*  --   --   --  160*  --   BUN 35*   < > 38* 37*  --  26*  --  30*  --   --   --  34*  --   CREATININE 1.64*   < > 1.94* 1.72*  --  1.30*  --  1.25*  --   --   --  2.00*  --   CALCIUM 9.9   < > 9.7 9.1  --  9.1  --  9.3  --   --   --  9.1  --   PHOS 5.1*  --   --   --   --   --   --   --   --   --   --   --   --    < > = values in this interval not displayed.   GFR: Estimated Creatinine Clearance: 46.5 mL/min (A) (by C-G formula based on SCr of 2 mg/dL (H)). Liver Function Tests: Recent Labs  Lab 02/24/21 1628  ALBUMIN 3.5   No results for input(s): LIPASE, AMYLASE in the last 168 hours. No results for input(s): AMMONIA in the last 168 hours. Coagulation Profile: No results for input(s): INR, PROTIME in the last 168 hours. Cardiac Enzymes: No results for input(s): CKTOTAL, CKMB, CKMBINDEX, TROPONINI in the last 168 hours. BNP (last 3 results) No results for input(s): PROBNP in the last 8760 hours. HbA1C: Recent Labs    02/25/21 0316  HGBA1C 9.9*   CBG: Recent Labs  Lab 02/26/21 1657 02/26/21 2134 02/27/21 0747 02/27/21 1125 02/27/21 1634  GLUCAP 255* 137* 203* 185* 260*   Lipid Profile: No results for input(s): CHOL, HDL, LDLCALC, TRIG, CHOLHDL, LDLDIRECT in the last 72 hours. Thyroid Function Tests: No results for input(s): TSH, T4TOTAL, FREET4, T3FREE, THYROIDAB in the last 72 hours. Anemia Panel: No results for input(s): VITAMINB12, FOLATE, FERRITIN, TIBC, IRON, RETICCTPCT in the last 72 hours. Sepsis Labs: No results for input(s): PROCALCITON, LATICACIDVEN in the last  168 hours.  Recent Results (from the past 240 hour(s))  Resp Panel by RT-PCR (Flu A&B, Covid) Nasopharyngeal Swab     Status: None   Collection Time: 02/24/21  6:22 PM   Specimen: Nasopharyngeal  Swab; Nasopharyngeal(NP) swabs in vial transport medium  Result Value Ref Range Status   SARS Coronavirus 2 by RT PCR NEGATIVE NEGATIVE Final    Comment: (NOTE) SARS-CoV-2 target nucleic acids are NOT DETECTED.  The SARS-CoV-2 RNA is generally detectable in upper respiratory specimens during the acute phase of infection. The lowest concentration of SARS-CoV-2 viral copies this assay can detect is 138 copies/mL. A negative result does not preclude SARS-Cov-2 infection and should not be used as the sole basis for treatment or other patient management decisions. A negative result may occur with  improper specimen collection/handling, submission of specimen other than nasopharyngeal swab, presence of viral mutation(s) within the areas targeted by this assay, and inadequate number of viral copies(<138 copies/mL). A negative result must be combined with clinical observations, patient history, and epidemiological information. The expected result is Negative.  Fact Sheet for Patients:  BloggerCourse.com  Fact Sheet for Healthcare Providers:  SeriousBroker.it  This test is no t yet approved or cleared by the [place] FDA and  has been authorized for detection and/or diagnosis of SARS-CoV-2 by FDA under an Emergency Use Authorization (EUA). This EUA will remain  in effect (meaning this test can be used) for the duration of the COVID-19 declaration under Section 564(b)(1) of the Act, 21 U.S.C.section 360bbb-3(b)(1), unless the authorization is terminated  or revoked sooner.       Influenza A by PCR NEGATIVE NEGATIVE Final   Influenza B by PCR NEGATIVE NEGATIVE Final    Comment: (NOTE) The Xpert Xpress SARS-CoV-2/FLU/RSV plus assay is  intended as an aid in the diagnosis of influenza from Nasopharyngeal swab specimens and should not be used as a sole basis for treatment. Nasal washings and aspirates are unacceptable for Xpert Xpress SARS-CoV-2/FLU/RSV testing.  Fact Sheet for Patients: BloggerCourse.com  Fact Sheet for Healthcare Providers: SeriousBroker.it  This test is not yet approved or cleared by the [place] FDA and has been authorized for detection and/or diagnosis of SARS-CoV-2 by FDA under an Emergency Use Authorization (EUA). This EUA will remain in effect (meaning this test can be used) for the duration of the COVID-19 declaration under Section 564(b)(1) of the Act, 21 U.S.C. section 360bbb-3(b)(1), unless the authorization is terminated or revoked.  Performed at [facility], [address] [place], [place] [address]   MRSA Next Gen by PCR, Nasal     Status: None   Collection Time: 02/24/21 10:17 PM   Specimen: Nasal Mucosa; Nasal Swab  Result Value Ref Range Status   MRSA by PCR Next Gen NOT DETECTED NOT DETECTED Final    Comment: (NOTE) The GeneXpert MRSA Assay (FDA approved for NASAL specimens only), is one component of a comprehensive MRSA colonization surveillance program. It is not intended to diagnose MRSA infection nor to guide or monitor treatment for MRSA infections. Test performance is not FDA approved in patients less than 8 years old. Performed at [facility], [address] [place], [place] [address]      Radiology Studies: No results found.  Scheduled Meds:  amLODipine  5 mg Oral Daily   Chlorhexidine Gluconate Cloth  6 each Topical Daily   collagenase   Topical Daily   enoxaparin (LOVENOX) injection  40 mg Subcutaneous Q24H   insulin aspart  0-15 Units Subcutaneous TID WC   insulin glargine-yfgn  12 Units Subcutaneous Q24H   mouth rinse  15 mL Mouth Rinse BID   metoCLOPramide  (REGLAN) injection  10 mg Intravenous Q8H  metoprolol tartrate  12.5 mg Oral BID   Continuous Infusions:   LOS: 3 days   Rickey BarbaraStephen Espiridion Supinski, MD [facility] Pager On Amion  If 7PM-7AM, please contact night-coverage 02/27/2021, 5:31 PM

## 2021-02-27 NOTE — Progress Notes (Signed)
Patient BP low last night 83/50 held lopressor. HR 110's went down after melatonin. BP this am 92/40.  A&Ox4. MD Aware.

## 2021-02-28 DIAGNOSIS — E0910 Drug or chemical induced diabetes mellitus with ketoacidosis without coma: Secondary | ICD-10-CM

## 2021-02-28 DIAGNOSIS — R112 Nausea with vomiting, unspecified: Secondary | ICD-10-CM | POA: Diagnosis not present

## 2021-02-28 LAB — BASIC METABOLIC PANEL
Anion gap: 9 (ref 5–15)
BUN: 30 mg/dL — ABNORMAL HIGH (ref 6–20)
CO2: 28 mmol/L (ref 22–32)
Calcium: 9 mg/dL (ref 8.9–10.3)
Chloride: 98 mmol/L (ref 98–111)
Creatinine, Ser: 1 mg/dL (ref 0.44–1.00)
GFR, Estimated: >60 mL/min (ref >60)
Glucose, Bld: 153 mg/dL — ABNORMAL HIGH (ref 70–99)
Potassium: 3.9 mmol/L (ref 3.5–5.1)
Sodium: 135 mmol/L (ref 135–145)

## 2021-02-28 LAB — GLUCOSE, CAPILLARY
Glucose-Capillary: 173 mg/dL — ABNORMAL HIGH (ref 70–99)
Glucose-Capillary: 210 mg/dL — ABNORMAL HIGH (ref 70–99)
Glucose-Capillary: 140 mg/dL — ABNORMAL HIGH (ref 70–99)

## 2021-02-28 MED ORDER — METOCLOPRAMIDE HCL 10 MG PO TABS: 10.0000 mg | ORAL_TABLET | Freq: Three times a day (TID) | ORAL | 0 refills | Status: DC

## 2021-02-28 MED ORDER — ALUM & MAG HYDROXIDE-SIMETH 200-200-20 MG/5ML PO SUSP
15.0000 mL | ORAL | Status: DC | PRN
  Administered 2021-02-28: 15 mL via ORAL
  Filled 2021-02-28: qty 30

## 2021-02-28 NOTE — Plan of Care (Signed)
Noted patient reports PI meds offer little to no relief. Pt reports in her history of attempting to manage her pain, Ketamine offered the best and longest result. Pt reported she would go hours pain free. Pt reports with current medical plan pain remains between a 4-9 at all times. On going assessment. Problem: Pain Managment: Goal: General experience of comfort will improve Outcome: Progressing   Problem: Pain Managment: Goal: General experience of comfort will improve Outcome: Progressing

## 2021-02-28 NOTE — Progress Notes (Signed)
Inpatient Diabetes Program Recommendations  AACE/ADA: New Consensus Statement on Inpatient Glycemic Control (2015)  Target Ranges:  Prepandial:   less than 140 mg/dL      Peak postprandial:   less than 180 mg/dL (1-2 hours)      Critically ill patients:  140 - 180 mg/dL   Lab Results  Component Value Date   GLUCAP 173 (H) 02/28/2021   HGBA1C 9.9 (H) 02/25/2021    Review of Glycemic Control  Diabetes history: DM1 Outpatient Diabetes medications: Lantus 18 units QHS, Novolog 0-15 TID Current orders for Inpatient glycemic control: Semglee 12 units Q24H, Novolog 0-15 units TID with meals  HgbA1C - 9.9% CBG 173 this am. Nausea improved and tolerating some food  Spoke with pt at length late yesterday afternoon regarding her diabetes control, HgbA1C of 9.9%. Pt monitors her blood sugars at home (uses Dexcom) and said she was actually doing better until she started with nausea and vomiting prior to admission. Discussed how nutrition, exercise, stress management all affect blood sugar control and stressed importance of frequent monitoring and f/u with her PCP. Pt states she has been trying to eat healthier. Has no issues with getting her insulin. Answered questions and encouraged pt to get HgbA1C lowered to approx 7%.  Discussed above with RN.  Thank you. Ailene Ards, RD, LDN, CDE Inpatient Diabetes Coordinator [phone]

## 2021-02-28 NOTE — Plan of Care (Signed)
Patient discharged to home

## 2021-02-28 NOTE — Discharge Summary (Signed)
Physician Discharge Summary  Kelli Hudson KDT:[id number] DOB: Feb 18, 1991 DOA: 02/24/2021  PCP: [name], MD  Admit date: 02/24/2021 Discharge date: 02/28/2021  Admitted From: Home Disposition:  Home  Recommendations for Outpatient Follow-up:  Follow up with PCP in 1-2 weeks  Discharge Condition:Improved CODE STATUS:Full Diet recommendation: Diabetic   Brief/Interim Summary: 31 years old female with PMH significant for type 1 diabetes, gastroparesis, hypertension presented in the ED with complaints of nausea and vomiting. She reports her symptoms started last night.  She report compliance with her medications.  She denies any sick contacts.  Patient reports her nausea and vomiting was intractable, not getting better with over-the-counter medications.  She was found to have a blood glucose of 557 in the ER, she was started on insulin gtt. for presumed DKA. Patient is admitted for diabetic ketoacidosis and started on IV insulin as per protocol.  Anion gap resolved, transition to subcu insulin.  Patient continues to have ongoing nausea and vomiting and decreased tolerance to PO intake   Discharge Diagnoses:  Principal Problem:   Intractable nausea and vomiting Active Problems:   DKA (diabetic ketoacidosis) (HCC)  Intractable nausea and vomiting due to diabetic gastroparesis: High anion gap metabolic acidosis secondary to DKA. Patient presented with intractable nausea and vomiting with high anion gap. Elevated blood glucose, high anion gap, metabolic acidosis Patient was started on insulin gtt. as per DKA protocol. Anion gap is closed,  later transitioned to subcu insulin. Continued Semglee 12 units, regular insulin sliding scale while in hospital Continue antiemetics as needed. Diet was ultimately successfully advanced to carb mod diet Have prescribed PO reglan Cont home meds on d/c   AKI : Her baseline serum creatinine remains between 1.0-1.2 She presented with serum  creatinine 1.72.   Avoid nephrotoxic medications, continue to monitor serum creatinine Cr improved with hydration   Essential hypertension: Continue amlodipine daily, hydralazine as needed. BP currently stable   Left foot wound.: Wound care consulted,  no signs of infection. Continued collagenase.    Discharge Instructions   Allergies as of 02/28/2021   No Known Allergies      Medication List     STOP taking these medications    atenolol 25 MG tablet Commonly known as: TENORMIN   doxycycline 100 MG tablet Commonly known as: VIBRA-TABS   HYDROcodone-acetaminophen 5-325 MG tablet Commonly known as: NORCO/VICODIN   mupirocin ointment 2 % Commonly known as: BACTROBAN       TAKE these medications    albuterol 108 (90 Base) MCG/ACT inhaler Commonly known as: VENTOLIN HFA Inhale 2 puffs into the lungs every 6 (six) hours as needed for shortness of breath.   calcium gluconate 500 MG tablet Take 1 tablet (500 mg total) by mouth 3 (three) times daily.   Dexcom G6 Receiver Devi USE AS DIRECTED What changed:  how much to take how to take this when to take this   Dexcom G6 Sensor Misc 1 each by Does not apply route daily.   Dexcom G6 Transmitter Misc 1 each by Does not apply route daily.   famotidine 20 MG tablet Commonly known as: PEPCID Take 1 tablet (20 mg total) by mouth 2 (two) times daily.   hyoscyamine 0.375 MG 12 hr tablet Commonly known as: LEVBID Take 1 tablet (0.375 mg total) by mouth every 12 (twelve) hours as needed for cramping (abdominal pain).   insulin glargine 100 UNIT/ML injection Commonly known as: LANTUS Inject 0.18 mLs (18 Units total) into the skin daily. What  changed: when to take this   Insulin Pen Needle 31G X 5 MM Misc 1 Device by Does not apply route QID. For use with insulin pens   insulin regular 100 units/mL injection Commonly known as: NovoLIN R Inject 0-0.15 mLs (0-15 Units total) into the skin 3 (three) times daily  before meals.   lisinopril 10 MG tablet Commonly known as: ZESTRIL Take 1 tablet (10 mg total) by mouth daily.   metoCLOPramide 10 MG tablet Commonly known as: Reglan Take 1 tablet (10 mg total) by mouth 3 (three) times daily before meals.   pantoprazole 40 MG tablet Commonly known as: PROTONIX Take 40 mg by mouth daily.   promethazine 12.5 MG tablet Commonly known as: PHENERGAN Take 1 tablet (12.5 mg total) by mouth every 6 (six) hours as needed for nausea or vomiting.   Santyl ointment Generic drug: collagenase Apply 1 application topically daily.   True Metrix Blood Glucose Test test strip Generic drug: glucose blood Use as instructed What changed:  how much to take how to take this when to take this   True Metrix Meter w/Device Kit 1 Device by Does not apply route 4 (four) times daily.   TRUEplus Lancets 28G Misc assist with checking blood sugar TID and qhs What changed:  how much to take how to take this when to take this        Follow-up Information     [name], MD Follow up in 2 week(s).   Specialty: Family Medicine Why: Hospital follow up Contact information: [place] [address] [phone]                No Known Allergies  Procedures/Studies: No results found.  Subjective: Eager to go home  Discharge Exam: Vitals:   02/28/21 0810 02/28/21 1100  BP: 132/60 108/69  Pulse: 80 98  Resp: 16   Temp: 97.8 F (36.6 C) 98.2 F (36.8 C)  SpO2: 98% 96%   Vitals:   02/28/21 0400 02/28/21 0500 02/28/21 0810 02/28/21 1100  BP: 125/64  132/60 108/69  Pulse: 84  80 98  Resp: 16 14 16    Temp: 98.4 F (36.9 C)  97.8 F (36.6 C) 98.2 F (36.8 C)  TempSrc: Oral  Oral Oral  SpO2: 96%  98% 96%  Weight:      Height:        General: Pt is alert, awake, not in acute distress Cardiovascular: RRR, S1/S2 + Respiratory: CTA bilaterally, no wheezing, no rhonchi Abdominal: Soft, NT, ND, bowel sounds  + Extremities: no edema, no cyanosis   The results of significant diagnostics from this hospitalization (including imaging, microbiology, ancillary and laboratory) are listed below for reference.     Microbiology: Recent Results (from the past 240 hour(s))  Resp Panel by RT-PCR (Flu A&B, Covid) Nasopharyngeal Swab     Status: None   Collection Time: 02/24/21  6:22 PM   Specimen: Nasopharyngeal Swab; Nasopharyngeal(NP) swabs in vial transport medium  Result Value Ref Range Status   SARS Coronavirus 2 by RT PCR NEGATIVE NEGATIVE Final    Comment: (NOTE) SARS-CoV-2 target nucleic acids are NOT DETECTED.  The SARS-CoV-2 RNA is generally detectable in upper respiratory specimens during the acute phase of infection. The lowest concentration of SARS-CoV-2 viral copies this assay can detect is 138 copies/mL. A negative result does not preclude SARS-Cov-2 infection and should not be used as the sole basis for treatment or other patient management decisions. A negative result  may occur with  improper specimen collection/handling, submission of specimen other than nasopharyngeal swab, presence of viral mutation(s) within the areas targeted by this assay, and inadequate number of viral copies(<138 copies/mL). A negative result must be combined with clinical observations, patient history, and epidemiological information. The expected result is Negative.  Fact Sheet for Patients:  EntrepreneurPulse.com.au  Fact Sheet for Healthcare Providers:  IncredibleEmployment.be  This test is no t yet approved or cleared by the [place] FDA and  has been authorized for detection and/or diagnosis of SARS-CoV-2 by FDA under an Emergency Use Authorization (EUA). This EUA will remain  in effect (meaning this test can be used) for the duration of the COVID-19 declaration under Section 564(b)(1) of the Act, 21 U.S.C.section 360bbb-3(b)(1), unless the authorization is  terminated  or revoked sooner.       Influenza A by PCR NEGATIVE NEGATIVE Final   Influenza B by PCR NEGATIVE NEGATIVE Final    Comment: (NOTE) The Xpert Xpress SARS-CoV-2/FLU/RSV plus assay is intended as an aid in the diagnosis of influenza from Nasopharyngeal swab specimens and should not be used as a sole basis for treatment. Nasal washings and aspirates are unacceptable for Xpert Xpress SARS-CoV-2/FLU/RSV testing.  Fact Sheet for Patients: EntrepreneurPulse.com.au  Fact Sheet for Healthcare Providers: IncredibleEmployment.be  This test is not yet approved or cleared by the [place] FDA and has been authorized for detection and/or diagnosis of SARS-CoV-2 by FDA under an Emergency Use Authorization (EUA). This EUA will remain in effect (meaning this test can be used) for the duration of the COVID-19 declaration under Section 564(b)(1) of the Act, 21 U.S.C. section 360bbb-3(b)(1), unless the authorization is terminated or revoked.  Performed at [facility], [place] [address], [place], [place] [address]   MRSA Next Gen by PCR, Nasal     Status: None   Collection Time: 02/24/21 10:17 PM   Specimen: Nasal Mucosa; Nasal Swab  Result Value Ref Range Status   MRSA by PCR Next Gen NOT DETECTED NOT DETECTED Final    Comment: (NOTE) The GeneXpert MRSA Assay (FDA approved for NASAL specimens only), is one component of a comprehensive MRSA colonization surveillance program. It is not intended to diagnose MRSA infection nor to guide or monitor treatment for MRSA infections. Test performance is not FDA approved in patients less than 68 years old. Performed at [facility], [place] [address] [place], [place] [address]      Labs: BNP (last 3 results) No results for input(s): BNP in the last 8760 hours. Basic Metabolic Panel: Recent Labs  Lab 02/24/21 1628 02/24/21 1731 02/25/21 0316 02/25/21 0823  02/26/21 0259 02/26/21 0629 02/26/21 1017 02/26/21 1438 02/26/21 2223 02/27/21 0245 02/27/21 1409 02/27/21 1811 02/28/21 0253  NA 134*   < > 140  --  138  --  137  --   --  137  --   --  135  K 4.7   < > 4.5   < > 4.1   < > 3.7   3.7   < > 4.0 4.0   4.0 3.9 3.9 3.9  CL 99   < > 108  --  102  --  101  --   --  101  --   --  98  CO2 19*   < > 25  --  24  --  25  --   --  27  --   --  28  GLUCOSE 513*   < >  163*  --  303*  --  202*  --   --  160*  --   --  153*  BUN 35*   < > 37*  --  26*  --  30*  --   --  34*  --   --  30*  CREATININE 1.64*   < > 1.72*  --  1.30*  --  1.25*  --   --  2.00*  --   --  1.00  CALCIUM 9.9   < > 9.1  --  9.1  --  9.3  --   --  9.1  --   --  9.0  PHOS 5.1*  --   --   --   --   --   --   --   --   --   --   --   --    < > = values in this interval not displayed.   Liver Function Tests: Recent Labs  Lab 02/24/21 1628  ALBUMIN 3.5   No results for input(s): LIPASE, AMYLASE in the last 168 hours. No results for input(s): AMMONIA in the last 168 hours. CBC: Recent Labs  Lab 02/24/21 1007 02/24/21 1731 02/25/21 0035 02/25/21 0321  WBC 8.8 11.4* 13.8* 14.0*  NEUTROABS 6.4  --   --   --   HGB 12.7 12.2 10.7* 10.9*  HCT 38.1 36.8 31.6* 31.9*  MCV 88.2 87.6 86.6 85.8  PLT 508* 468* 427* 427*   Cardiac Enzymes: No results for input(s): CKTOTAL, CKMB, CKMBINDEX, TROPONINI in the last 168 hours. BNP: Invalid input(s): POCBNP CBG: Recent Labs  Lab 02/27/21 1634 02/27/21 2233 02/28/21 0740 02/28/21 1115 02/28/21 1608  GLUCAP 260* 148* 173* 210* 140*   D-Dimer No results for input(s): DDIMER in the last 72 hours. Hgb A1c No results for input(s): HGBA1C in the last 72 hours. Lipid Profile No results for input(s): CHOL, HDL, LDLCALC, TRIG, CHOLHDL, LDLDIRECT in the last 72 hours. Thyroid function studies No results for input(s): TSH, T4TOTAL, T3FREE, THYROIDAB in the last 72 hours.  Invalid input(s): FREET3 Anemia work up No results for  input(s): VITAMINB12, FOLATE, FERRITIN, TIBC, IRON, RETICCTPCT in the last 72 hours. Urinalysis    Component Value Date/Time   COLORURINE YELLOW 11/16/2020 0355   APPEARANCEUR HAZY (A) 11/16/2020 0355   LABSPEC 1.034 (H) 11/16/2020 0355   PHURINE 5.0 11/16/2020 0355   GLUCOSEU 150 (A) 11/16/2020 0355   GLUCOSEU NEGATIVE 05/22/2020 1417   HGBUR NEGATIVE 11/16/2020 0355   BILIRUBINUR NEGATIVE 11/16/2020 0355   KETONESUR 20 (A) 11/16/2020 0355   PROTEINUR >=300 (A) 11/16/2020 0355   UROBILINOGEN 0.2 05/22/2020 1417   NITRITE NEGATIVE 11/16/2020 0355   LEUKOCYTESUR NEGATIVE 11/16/2020 0355   Sepsis Labs Invalid input(s): PROCALCITONIN,  WBC,  LACTICIDVEN Microbiology Recent Results (from the past 240 hour(s))  Resp Panel by RT-PCR (Flu A&B, Covid) Nasopharyngeal Swab     Status: None   Collection Time: 02/24/21  6:22 PM   Specimen: Nasopharyngeal Swab; Nasopharyngeal(NP) swabs in vial transport medium  Result Value Ref Range Status   SARS Coronavirus 2 by RT PCR NEGATIVE NEGATIVE Final    Comment: (NOTE) SARS-CoV-2 target nucleic acids are NOT DETECTED.  The SARS-CoV-2 RNA is generally detectable in upper respiratory specimens during the acute phase of infection. The lowest concentration of SARS-CoV-2 viral copies this assay can detect is 138 copies/mL. A negative result does not preclude SARS-Cov-2 infection and should not be used as the sole basis  for treatment or other patient management decisions. A negative result may occur with  improper specimen collection/handling, submission of specimen other than nasopharyngeal swab, presence of viral mutation(s) within the areas targeted by this assay, and inadequate number of viral copies(<138 copies/mL). A negative result must be combined with clinical observations, patient history, and epidemiological information. The expected result is Negative.  Fact Sheet for Patients:  EntrepreneurPulse.com.au  Fact Sheet  for Healthcare Providers:  IncredibleEmployment.be  This test is no t yet approved or cleared by the [place] FDA and  has been authorized for detection and/or diagnosis of SARS-CoV-2 by FDA under an Emergency Use Authorization (EUA). This EUA will remain  in effect (meaning this test can be used) for the duration of the COVID-19 declaration under Section 564(b)(1) of the Act, 21 U.S.C.section 360bbb-3(b)(1), unless the authorization is terminated  or revoked sooner.       Influenza A by PCR NEGATIVE NEGATIVE Final   Influenza B by PCR NEGATIVE NEGATIVE Final    Comment: (NOTE) The Xpert Xpress SARS-CoV-2/FLU/RSV plus assay is intended as an aid in the diagnosis of influenza from Nasopharyngeal swab specimens and should not be used as a sole basis for treatment. Nasal washings and aspirates are unacceptable for Xpert Xpress SARS-CoV-2/FLU/RSV testing.  Fact Sheet for Patients: EntrepreneurPulse.com.au  Fact Sheet for Healthcare Providers: IncredibleEmployment.be  This test is not yet approved or cleared by the [place] FDA and has been authorized for detection and/or diagnosis of SARS-CoV-2 by FDA under an Emergency Use Authorization (EUA). This EUA will remain in effect (meaning this test can be used) for the duration of the COVID-19 declaration under Section 564(b)(1) of the Act, 21 U.S.C. section 360bbb-3(b)(1), unless the authorization is terminated or revoked.  Performed at [facility], [place] [address] [place], [place] [address]   MRSA Next Gen by PCR, Nasal     Status: None   Collection Time: 02/24/21 10:17 PM   Specimen: Nasal Mucosa; Nasal Swab  Result Value Ref Range Status   MRSA by PCR Next Gen NOT DETECTED NOT DETECTED Final    Comment: (NOTE) The GeneXpert MRSA Assay (FDA approved for NASAL specimens only), is one component of a comprehensive MRSA colonization  surveillance program. It is not intended to diagnose MRSA infection nor to guide or monitor treatment for MRSA infections. Test performance is not FDA approved in patients less than 81 years old. Performed at [facility], [place] [address], [place], [place] [address]    Time spent: 52mn  SIGNED:   SMarylu Lund MD  [facility] 02/28/2021, 4:22 PM  If 7PM-7AM, please contact night-coverage

## 2021-03-01 ENCOUNTER — Other Ambulatory Visit: Payer: Self-pay | Admitting: Family Medicine

## 2021-03-01 ENCOUNTER — Telehealth: Payer: Self-pay

## 2021-03-01 ENCOUNTER — Encounter (HOSPITAL_BASED_OUTPATIENT_CLINIC_OR_DEPARTMENT_OTHER): Payer: 59 | Admitting: Internal Medicine

## 2021-03-01 ENCOUNTER — Encounter (HOSPITAL_COMMUNITY): Payer: Self-pay

## 2021-03-01 NOTE — Telephone Encounter (Signed)
PA for Dexcom G6  submitted through cover my meds to [employer]. Awaiting on response. Dm/cma    (Key: [id number]

## 2021-03-04 NOTE — Telephone Encounter (Signed)
PA Case: [id number], Status: Approved, Coverage Starts on: 03/01/2021 12:00 AM, Coverage Ends on: 03/01/2022.    Pharmacy notified [facility] phone. Dm/cma

## 2021-03-11 ENCOUNTER — Encounter (HOSPITAL_BASED_OUTPATIENT_CLINIC_OR_DEPARTMENT_OTHER): Payer: 59 | Admitting: Internal Medicine

## 2021-03-11 ENCOUNTER — Other Ambulatory Visit: Payer: Self-pay

## 2021-03-11 DIAGNOSIS — E10621 Type 1 diabetes mellitus with foot ulcer: Secondary | ICD-10-CM | POA: Insufficient documentation

## 2021-03-11 DIAGNOSIS — L97528 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ty: Secondary | ICD-10-CM | POA: Diagnosis not present

## 2021-03-11 DIAGNOSIS — E1042 Type 1 diabetes mellitus with diabetic polyneuropathy: Secondary | ICD-10-CM | POA: Insufficient documentation

## 2021-03-12 ENCOUNTER — Other Ambulatory Visit (HOSPITAL_COMMUNITY): Payer: Self-pay | Admitting: Internal Medicine

## 2021-03-12 DIAGNOSIS — L97309 Non-pressure chronic ulcer of unspecified ankle with unspecified severity: Secondary | ICD-10-CM

## 2021-03-12 NOTE — Progress Notes (Signed)
JOCELL, GERHOLD ([id number]) Visit Report for 03/11/2021 Arrival Information Details Patient Name: Date of Service: Kelli Hudson [facility] 03/11/2021 3:15 PM Medical Record Number: [id number] Patient Account Number: [id number] Date of Birth/Sex: Treating RN: 07/02/1990 (31 y.o. Benjamine Sprague, Briant Cedar Primary Care Cadon Raczka: Arlester Marker Other Clinician: Referring Dagoberto Nealy: Treating Sylas Twombly/Extender: Fermin Schwab in Treatment: 3 Visit Information History Since Last Visit Added or deleted any medications: No Patient Arrived: Ambulatory Any new allergies or adverse reactions: No Arrival Time: 15:24 Had a fall or experienced change in No Accompanied By: spouse activities of daily living that may affect Transfer Assistance: None risk of falls: Patient Identification Verified: Yes Signs or symptoms of abuse/neglect since last visito No Secondary Verification Process Completed: Yes Hospitalized since last visit: No Patient Requires Transmission-Based Precautions: No Implantable device outside of the clinic excluding No Patient Has Alerts: No cellular tissue based products placed in the center since last visit: Has Dressing in Place as Prescribed: Yes Pain Present Now: No Electronic Signature(s) Signed: 03/11/2021 5:11:23 PM By: Levan Hurst RN, BSN Entered By: Levan Hurst on 03/11/2021 15:25:02 -------------------------------------------------------------------------------- Encounter Discharge Information Details Patient Name: Date of Service: Kelli Hudson [facility] 03/11/2021 3:15 PM Medical Record Number: [id number] Patient Account Number: [id number] Date of Birth/Sex: Treating RN: 1991-01-29 (31 y.o. Nancy Fetter Primary Care Frederich Montilla: Arlester Marker Other Clinician: Referring Amiliah Campisi: Treating Donnamaria Shands/Extender: Fermin Schwab in Treatment: 3 Encounter Discharge Information Items Post Procedure Vitals Discharge  Condition: Stable Temperature (F): 98.4 Ambulatory Status: Ambulatory Pulse (bpm): 102 Discharge Destination: Home Respiratory Rate (breaths/min): 16 Transportation: Private Auto Blood Pressure (mmHg): 103/72 Accompanied By: spouse Schedule Follow-up Appointment: Yes Clinical Summary of Care: Patient Declined Electronic Signature(s) Signed: 03/11/2021 5:11:23 PM By: Levan Hurst RN, BSN Entered By: Levan Hurst on 03/11/2021 16:35:49 -------------------------------------------------------------------------------- Lower Extremity Assessment Details Patient Name: Date of Service: Kelli Hudson [facility] 03/11/2021 3:15 PM Medical Record Number: [id number] Patient Account Number: [id number] Date of Birth/Sex: Treating RN: 04-04-1990 (31 y.o. Nancy Fetter Primary Care Joelyn Lover: Arlester Marker Other Clinician: Referring Onyinyechi Huante: Treating Evan Mackie/Extender: Fermin Schwab in Treatment: 3 Edema Assessment Assessed: [name]: No] [Right: No] E[Left: dema] [Right: :] Calf Left: Right: Point of Measurement: 31 cm From Medial Instep 35.5 cm Ankle Left: Right: Point of Measurement: 10 cm From Medial Instep 22 cm Vascular Assessment Pulses: Dorsalis Pedis Palpable: [Left:Yes] Electronic Signature(s) Signed: 03/11/2021 5:11:23 PM By: Levan Hurst RN, BSN Entered By: Levan Hurst on 03/11/2021 15:30:21 -------------------------------------------------------------------------------- Multi Wound Chart Details Patient Name: Date of Service: Kelli Hudson [facility] 03/11/2021 3:15 PM Medical Record Number: [id number] Patient Account Number: [id number] Date of Birth/Sex: Treating RN: 08-16-1990 (31 y.o. [name] Primary Care Jamani Bearce: Arlester Marker Other Clinician: Referring Raymie Trani: Treating Jaquann Guarisco/Extender: Fermin Schwab in Treatment: 3 Vital Signs Height(in): 68 Capillary Blood Glucose(mg/dl):  370 Weight(lbs): 183 Pulse(bpm): 102 Body Mass Index(BMI): 27.8 Blood Pressure(mmHg): 103/72 Temperature(F): 98.4 Respiratory Rate(breaths/min): 16 Photos: [1:Left, Dorsal Foot] [2:Left, Lateral Foot] [3:Left T Third oe] Wound Location: [1:Gradually Appeared] [2:Blister] [3:Gradually Appeared] Wounding Event: [1:Diabetic Wound/Ulcer of the Lower] [2:Diabetic Wound/Ulcer of the Lower] [3:Diabetic Wound/Ulcer of the Lower] Primary Etiology: [1:Extremity Anemia, Type I Diabetes, Neuropathy Anemia, Type I Diabetes, Neuropathy Anemia, Type I Diabetes, Neuropathy] [2:Extremity] [3:Extremity] Comorbid History: [1:01/04/2021] [2:01/04/2021] [3:01/04/2021] Date Acquired: [1:3] [2:3] [3:3] Weeks of Treatment: [1:Open] [2:Open] [3:Healed - Epithelialized] Wound Status: [1:No] [2:No] [3:No] Wound Recurrence: [1:6.5x4x0.1] [2:2.8x1.9x0.1] [  3:0x0x0] Measurements L x W x D (cm) [1:20.42] [2:4.178] [3:0] A (cm) : rea [1:2.042] [id number] [3:0] Volume (cm) : [1:21.00%] [2:58.50%] [3:100.00%] % Reduction in A [1:rea: 21.00%] [2:58.40%] [3:100.00%] % Reduction in Volume: [1:Grade 1] [2:Grade 1] [3:Grade 1] Classification: [1:Medium] [2:Medium] [3:None Present] Exudate A mount: [1:Serosanguineous] [2:Serosanguineous] [3:N/A] Exudate Type: [1:red, brown] [2:red, brown] [3:N/A] Exudate Color: [1:Distinct, outline attached] [2:Distinct, outline attached] [3:Distinct, outline attached] Wound Margin: [1:Medium (34-66%)] [2:Small (1-33%)] [3:None Present (0%)] Granulation A mount: [1:Red, Pink, Hyper-granulation] [2:Red] [3:N/A] Granulation Quality: [1:Medium (34-66%)] [2:Large (67-100%)] [3:None Present (0%)] Necrotic A mount: [1:Adherent Slough] [2:Eschar, Adherent Slough] [3:N/A] Necrotic Tissue: [1:Fat Layer (Subcutaneous Tissue): Yes Fat Layer (Subcutaneous Tissue): Yes Fascia: No] Exposed Structures: [1:Fascia: No Tendon: No Muscle: No Joint: No Bone: No Small (1-33%)] [2:Fascia: No Tendon: No  Muscle: No Joint: No Bone: No Small (1-33%)] [3:Fat Layer (Subcutaneous Tissue): No Tendon: No Muscle: No Joint: No Bone: No Large (67-100%)] Epithelialization: [1:Debridement - Excisional] [2:Debridement - Excisional] [3:N/A] Debridement: Pre-procedure Verification/Time Out 15:43 [2:15:43] [3:N/A] Taken: [1:Subcutaneous, Slough] [2:Subcutaneous, Slough] [3:N/A] Tissue Debrided: [1:Skin/Subcutaneous Tissue] [2:Skin/Subcutaneous Tissue] [3:N/A] Level: [1:12] [2:5.32] [3:N/A] Debridement A (sq cm): [1:rea Curette] [2:Curette] [3:N/A] Instrument: [1:Minimum] [2:Minimum] [3:N/A] Bleeding: [1:Pressure] [2:Pressure] [3:N/A] Hemostasis A chieved: [1:0] [2:0] [3:N/A] Procedural Pain: [1:0] [2:0] [3:N/A] Post Procedural Pain: [1:Procedure was tolerated well] [2:Procedure was tolerated well] [3:N/A] Debridement Treatment Response: [1:6.5x4x0.1] [2:2.8x1.9x0.1] [3:N/A] Post Debridement Measurements L x W x D (cm) [1:2.042] [2:0.418] [3:N/A] Post Debridement Volume: (cm) [1:Debridement] [2:Debridement] [3:N/A] Treatment Notes Electronic Signature(s) Signed: 03/11/2021 4:10:00 PM By: Kalman Shan DO Signed: 03/12/2021 4:23:30 PM By: Lorrin Jackson Entered By: Kalman Shan on 03/11/2021 16:02:35 -------------------------------------------------------------------------------- Multi-Disciplinary Care Plan Details Patient Name: Date of Service: Kelli Hudson [facility] 03/11/2021 3:15 PM Medical Record Number: [id number] Patient Account Number: [id number] Date of Birth/Sex: Treating RN: 02/27/1990 (31 y.o. Nancy Fetter Primary Care Eliodoro Gullett: Arlester Marker Other Clinician: Referring Stefanny Pieri: Treating Deysha Cartier/Extender: Fermin Schwab in Treatment: 3 Active Inactive Nutrition Nursing Diagnoses: Impaired glucose control: actual or potential Goals: Patient/caregiver verbalizes understanding of need to maintain therapeutic glucose control per primary care  physician Date Initiated: 02/14/2021 Target Resolution Date: 03/22/2021 Goal Status: Active Interventions: Assess HgA1c results as ordered upon admission and as needed Provide education on elevated blood sugars and impact on wound healing Notes: Wound/Skin Impairment Nursing Diagnoses: Impaired tissue integrity Goals: Patient/caregiver will verbalize understanding of skin care regimen Date Initiated: 02/14/2021 Target Resolution Date: 03/22/2021 Goal Status: Active Ulcer/skin breakdown will have a volume reduction of 30% by week 4 Date Initiated: 02/14/2021 Target Resolution Date: 03/22/2021 Goal Status: Active Interventions: Assess patient/caregiver ability to obtain necessary supplies Assess patient/caregiver ability to perform ulcer/skin care regimen upon admission and as needed Assess ulceration(s) every visit Provide education on ulcer and skin care Treatment Activities: Topical wound management initiated : 02/14/2021 Notes: Electronic Signature(s) Signed: 03/11/2021 5:11:23 PM By: Levan Hurst RN, BSN Entered By: Levan Hurst on 03/11/2021 15:46:11 -------------------------------------------------------------------------------- Pain Assessment Details Patient Name: Date of Service: Kelli Hudson [facility] 03/11/2021 3:15 PM Medical Record Number: [id number] Patient Account Number: [id number] Date of Birth/Sex: Treating RN: 1990/03/09 (31 y.o. Nancy Fetter Primary Care Cindra Austad: Arlester Marker Other Clinician: Referring Chanah Tidmore: Treating Tecora Eustache/Extender: Fermin Schwab in Treatment: 3 Active Problems Location of Pain Severity and Description of Pain Patient Has Paino No Site Locations Pain Management and Medication Current Pain Management: Electronic Signature(s) Signed: 03/11/2021 5:11:23 PM By: Levan Hurst RN, BSN  Entered By: Levan Hurst on 03/11/2021  15:30:17 -------------------------------------------------------------------------------- Patient/Caregiver Education Details Patient Name: Date of Service: Kelli Hudson [facility] 1/23/2023andnbsp3:15 PM Medical Record Number: [id number] Patient Account Number: [id number] Date of Birth/Gender: Treating RN: 04/20/1990 (31 y.o. Nancy Fetter Primary Care Physician: Arlester Marker Other Clinician: Referring Physician: Treating Physician/Extender: Fermin Schwab in Treatment: 3 Education Assessment Education Provided To: Patient Education Topics Provided Wound/Skin Impairment: Methods: Explain/Verbal Responses: State content correctly Electronic Signature(s) Signed: 03/11/2021 5:11:23 PM By: Levan Hurst RN, BSN Entered By: Levan Hurst on 03/11/2021 15:46:22 -------------------------------------------------------------------------------- Wound Assessment Details Patient Name: Date of Service: Kelli Hudson [facility] 03/11/2021 3:15 PM Medical Record Number: [id number] Patient Account Number: [id number] Date of Birth/Sex: Treating RN: [birth year] (31 y.o. Nancy Fetter Primary Care Melodye Swor: Arlester Marker Other Clinician: Referring Blaize Epple: Treating Corinda Ammon/Extender: Fermin Schwab in Treatment: 3 Wound Status Wound Number: 1 Primary Etiology: Diabetic Wound/Ulcer of the Lower Extremity Wound Location: Left, Dorsal Foot Wound Status: Open Wounding Event: Gradually Appeared Comorbid History: Anemia, Type I Diabetes, Neuropathy Date Acquired: 01/04/2021 Weeks Of Treatment: 3 Clustered Wound: No Photos Wound Measurements Length: (cm) 6.5 Width: (cm) 4 Depth: (cm) 0.1 Area: (cm) 20.42 Volume: (cm) 2.042 % Reduction in Area: 21% % Reduction in Volume: 21% Epithelialization: Small (1-33%) Tunneling: No Undermining: No Wound Description Classification: Grade 1 Wound Margin: Distinct, outline  attached Exudate Amount: Medium Exudate Type: Serosanguineous Exudate Color: red, brown Foul Odor After Cleansing: No Slough/Fibrino Yes Wound Bed Granulation Amount: Medium (34-66%) Exposed Structure Granulation Quality: Red, Pink, Hyper-granulation Fascia Exposed: No Necrotic Amount: Medium (34-66%) Fat Layer (Subcutaneous Tissue) Exposed: Yes Necrotic Quality: Adherent Slough Tendon Exposed: No Muscle Exposed: No Joint Exposed: No Bone Exposed: No Treatment Notes Wound #1 (Foot) Wound Laterality: Dorsal, Left Cleanser Soap and Water Discharge Instruction: May shower and wash wound with dial antibacterial soap and water prior to dressing change. Peri-Wound Care Topical Primary Dressing Hydrofera Blue Ready Foam, 4x5 in Discharge Instruction: Apply over Santyl Santyl Ointment Discharge Instruction: ***APPLY ONLY TO SLOUGH (YELLOW/BROWN) AREAS*** Secondary Dressing Woven Gauze Sponge, Non-Sterile 4x4 in Discharge Instruction: Apply over primary dressing as directed. ABD Pad, 5x9 Discharge Instruction: Apply over primary dressing as directed. Secured With [employer], 4.5x3.1 (in/yd) Discharge Instruction: Secure with Kerlix as directed. 34M Medipore H Soft Cloth Surgical T ape, 4 x 10 (in/yd) Discharge Instruction: Secure with tape as directed. Compression Wrap Compression Stockings Add-Ons Electronic Signature(s) Signed: 03/11/2021 5:11:23 PM By: Levan Hurst RN, BSN Entered By: Levan Hurst on 03/11/2021 15:42:59 -------------------------------------------------------------------------------- Wound Assessment Details Patient Name: Date of Service: Kelli Hudson [facility] 03/11/2021 3:15 PM Medical Record Number: [id number] Patient Account Number: [id number] Date of Birth/Sex: Treating RN: 1990/09/14 (31 y.o. Nancy Fetter Primary Care Jomarion Mish: Arlester Marker Other Clinician: Referring Meghin Thivierge: Treating Terrisha Lopata/Extender: Fermin Schwab in Treatment: 3 Wound Status Wound Number: 2 Primary Etiology: Diabetic Wound/Ulcer of the Lower Extremity Wound Location: Left, Lateral Foot Wound Status: Open Wounding Event: Blister Comorbid History: Anemia, Type I Diabetes, Neuropathy Date Acquired: 01/04/2021 Weeks Of Treatment: 3 Clustered Wound: No Photos Wound Measurements Length: (cm) 2.8 Width: (cm) 1.9 Depth: (cm) 0.1 Area: (cm) 4.178 Volume: (cm) 0.418 % Reduction in Area: 58.5% % Reduction in Volume: 58.4% Epithelialization: Small (1-33%) Tunneling: No Undermining: No Wound Description Classification: Grade 1 Wound Margin: Distinct, outline attached Exudate Amount: Medium Exudate Type: Serosanguineous Exudate Color: red, brown Foul Odor After Cleansing:  No Slough/Fibrino Yes Wound Bed Granulation Amount: Small (1-33%) Exposed Structure Granulation Quality: Red Fascia Exposed: No Necrotic Amount: Large (67-100%) Fat Layer (Subcutaneous Tissue) Exposed: Yes Necrotic Quality: Eschar, Adherent Slough Tendon Exposed: No Muscle Exposed: No Joint Exposed: No Bone Exposed: No Treatment Notes Wound #2 (Foot) Wound Laterality: Left, Lateral Cleanser Soap and Water Discharge Instruction: May shower and wash wound with dial antibacterial soap and water prior to dressing change. Peri-Wound Care Topical Primary Dressing Santyl Ointment Discharge Instruction: Apply nickel thick amount to wound bed as instructed Secondary Dressing Woven Gauze Sponge, Non-Sterile 4x4 in Discharge Instruction: Apply over primary dressing as directed. Secured With [employer], 4.5x3.1 (in/yd) Discharge Instruction: Secure with Kerlix as directed. 6M Medipore H Soft Cloth Surgical T ape, 4 x 10 (in/yd) Discharge Instruction: Secure with tape as directed. Compression Wrap Compression Stockings Add-Ons Electronic Signature(s) Signed: 03/11/2021 5:11:23 PM By: Levan Hurst RN, BSN Entered  By: Levan Hurst on 03/11/2021 15:31:58 -------------------------------------------------------------------------------- Wound Assessment Details Patient Name: Date of Service: Kelli Hudson [facility] 03/11/2021 3:15 PM Medical Record Number: [id number] Patient Account Number: [id number] Date of Birth/Sex: Treating RN: 1990-05-11 (31 y.o. Nancy Fetter Primary Care Elster Corbello: Arlester Marker Other Clinician: Referring Jaquil Todt: Treating Demetra Moya/Extender: Fermin Schwab in Treatment: 3 Wound Status Wound Number: 3 Primary Etiology: Diabetic Wound/Ulcer of the Lower Extremity Wound Location: Left T Third oe Wound Status: Healed - Epithelialized Wounding Event: Gradually Appeared Comorbid History: Anemia, Type I Diabetes, Neuropathy Date Acquired: 01/04/2021 Weeks Of Treatment: 3 Clustered Wound: No Photos Wound Measurements Length: (cm) Width: (cm) Depth: (cm) Area: (cm) Volume: (cm) 0 % Reduction in Area: 100% 0 % Reduction in Volume: 100% 0 Epithelialization: Large (67-100%) 0 Tunneling: No 0 Undermining: No Wound Description Classification: Grade 1 Wound Margin: Distinct, outline attached Exudate Amount: None Present Foul Odor After Cleansing: No Slough/Fibrino No Wound Bed Granulation Amount: None Present (0%) Exposed Structure Necrotic Amount: None Present (0%) Fascia Exposed: No Fat Layer (Subcutaneous Tissue) Exposed: No Tendon Exposed: No Muscle Exposed: No Joint Exposed: No Bone Exposed: No Electronic Signature(s) Signed: 03/11/2021 5:11:23 PM By: Levan Hurst RN, BSN Entered By: Levan Hurst on 03/11/2021 15:30:47 -------------------------------------------------------------------------------- [place] Details Patient Name: Date of Service: Kelli Hudson [facility] 03/11/2021 3:15 PM Medical Record Number: [id number] Patient Account Number: [id number] Date of Birth/Sex: Treating RN: April 30, 1990 (31 y.o. Nancy Fetter Primary Care Rakeen Gaillard: Arlester Marker Other Clinician: Referring Shannie Kontos: Treating Tallan Sandoz/Extender: Fermin Schwab in Treatment: 3 Vital Signs Time Taken: 15:24 Temperature (F): 98.4 Height (in): 68 Pulse (bpm): 102 Weight (lbs): 183 Respiratory Rate (breaths/min): 16 Body Mass Index (BMI): 27.8 Blood Pressure (mmHg): 103/72 Capillary Blood Glucose (mg/dl): 370 Reference Range: 80 - 120 mg / dl Notes glucose per pt report Electronic Signature(s) Signed: 03/11/2021 5:11:23 PM By: Levan Hurst RN, BSN Entered By: Levan Hurst on 03/11/2021 15:25:26

## 2021-03-12 NOTE — Progress Notes (Signed)
AIRYN, COTES ([id number]) Visit Report for 03/11/2021 Chief Complaint Document Details Patient Name: Date of Service: Elspeth Cho [facility] 03/11/2021 3:15 PM Medical Record Number: [id number] Patient Account Number: [id number] Date of Birth/Sex: Treating RN: 1990-03-03 (31 y.o. [name] Primary Care Provider: [name] Other Clinician: Referring Provider: Treating Provider/Extender: Kevin Fenton in Treatment: 3 Information Obtained from: Patient Chief Complaint Left foot wounds Electronic Signature(s) Signed: 03/11/2021 4:10:00 PM By: Geralyn Corwin DO Entered By: Geralyn Corwin on 03/11/2021 16:02:44 -------------------------------------------------------------------------------- Debridement Details Patient Name: Date of Service: Elspeth Cho [facility] 03/11/2021 3:15 PM Medical Record Number: [id number] Patient Account Number: [id number] Date of Birth/Sex: Treating RN: 10/23/1990 (31 y.o. Wynelle Link Primary Care Provider: [name] Other Clinician: Referring Provider: Treating Provider/Extender: Kevin Fenton in Treatment: 3 Debridement Performed for Assessment: Wound #1 Left,Dorsal Foot Performed By: Physician Geralyn Corwin, DO Debridement Type: Debridement Severity of Tissue Pre Debridement: Fat layer exposed Level of Consciousness (Pre-procedure): Awake and Alert Pre-procedure Verification/Time Out Yes - 15:43 Taken: Start Time: 15:43 T Area Debrided (L x W): otal 4 (cm) x 3 (cm) = 12 (cm) Tissue and other material debrided: Viable, Non-Viable, Slough, Subcutaneous, Slough Level: Skin/Subcutaneous Tissue Debridement Description: Excisional Instrument: Curette Bleeding: Minimum Hemostasis Achieved: Pressure End Time: 15:45 Procedural Pain: 0 Post Procedural Pain: 0 Response to Treatment: Procedure was tolerated well Level of Consciousness (Post- Awake and  Alert procedure): Post Debridement Measurements of Total Wound Length: (cm) 6.5 Width: (cm) 4 Depth: (cm) 0.1 Volume: (cm) 2.042 Character of Wound/Ulcer Post Debridement: Requires Further Debridement Severity of Tissue Post Debridement: Fat layer exposed Post Procedure Diagnosis Same as Pre-procedure Electronic Signature(s) Signed: 03/11/2021 4:10:00 PM By: Geralyn Corwin DO Signed: 03/11/2021 5:11:23 PM By: Zandra Abts RN, BSN Entered By: Zandra Abts on 03/11/2021 15:45:44 -------------------------------------------------------------------------------- Debridement Details Patient Name: Date of Service: Elspeth Cho [facility] 03/11/2021 3:15 PM Medical Record Number: [id number] Patient Account Number: [id number] Date of Birth/Sex: Treating RN: 12-12-1990 (30 y.o. Wynelle Link Primary Care Provider: [name] Other Clinician: Referring Provider: Treating Provider/Extender: Kevin Fenton in Treatment: 3 Debridement Performed for Assessment: Wound #2 Left,Lateral Foot Performed By: Physician Geralyn Corwin, DO Debridement Type: Debridement Severity of Tissue Pre Debridement: Fat layer exposed Level of Consciousness (Pre-procedure): Awake and Alert Pre-procedure Verification/Time Out Yes - 15:43 Taken: Start Time: 15:43 T Area Debrided (L x W): otal 2.8 (cm) x 1.9 (cm) = 5.32 (cm) Tissue and other material debrided: Viable, Non-Viable, Slough, Subcutaneous, Slough Level: Skin/Subcutaneous Tissue Debridement Description: Excisional Instrument: Curette Bleeding: Minimum Hemostasis Achieved: Pressure End Time: 15:45 Procedural Pain: 0 Post Procedural Pain: 0 Response to Treatment: Procedure was tolerated well Level of Consciousness (Post- Awake and Alert procedure): Post Debridement Measurements of Total Wound Length: (cm) 2.8 Width: (cm) 1.9 Depth: (cm) 0.1 Volume: (cm) 0.418 Character of Wound/Ulcer Post Debridement:  Requires Further Debridement Severity of Tissue Post Debridement: Fat layer exposed Post Procedure Diagnosis Same as Pre-procedure Electronic Signature(s) Signed: 03/11/2021 4:10:00 PM By: Geralyn Corwin DO Signed: 03/11/2021 5:11:23 PM By: Zandra Abts RN, BSN Entered By: Zandra Abts on 03/11/2021 15:49:27 -------------------------------------------------------------------------------- HPI Details Patient Name: Date of Service: Elspeth Cho [facility] 03/11/2021 3:15 PM Medical Record Number: [id number] Patient Account Number: [id number] Date of Birth/Sex: Treating RN: [birth year] (31 y.o. [name] Primary Care Provider: [name] Other Clinician: Referring Provider: Treating Provider/Extender: Kevin Fenton in  Treatment: 3 History of Present Illness HPI Description: Admission 02/14/2021 Ms. 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 While she was treating this wound she subsequently developed 3 additional wounds. These are located to the dorsal aspect and lateral aspect of the left foot and third toe. The wounds started out as blisters and she thinks these developed due to putting on the dressing too tightly around her foot. Currently she has been using Santyl to the wound beds however she has been leaving this to air dry throughout the day. She reports finishing doxycycline and currently denies signs of infection. 1/6; patient presents for follow-up. She has been using Santyl to the areas of eschar and Hydrofera Blue to the toe wound. She reports improvement in wound healing. She has no issues or complaints today. She denies signs of  infection. 1/23; patient presents for follow-up. She has been using Santyl to the wound beds without issues. She denies signs of infection. Electronic Signature(s) Signed: 03/11/2021 4:10:00 PM By: Kalman Shan DO Entered By: Kalman Shan on 03/11/2021 16:06:22 -------------------------------------------------------------------------------- Physical Exam Details Patient Name: Date of Service: Josefa Half [facility] 03/11/2021 3:15 PM Medical Record Number: [id number] Patient Account Number: [id number] Date of Birth/Sex: Treating RN: [birth year] (31 y.o. [name] Primary Care Provider: Arlester Marker Other Clinician: Referring Provider: Treating Provider/Extender: Fermin Schwab in Treatment: 3 Constitutional respirations regular, non-labored and within target range for patient.[name] Psychiatric pleasant and cooperative. Notes Left foot: T the dorsal aspect and lateral aspect there are wounds with granulation tissue and nonviable tissue present. There is hyper granulated tissue to the o dorsal wound. There is epithelialization to the third toe previous wound site. No surrounding signs of infection. Difficult to palpate pedal pulses. Electronic Signature(s) Signed: 03/11/2021 4:10:00 PM By: Kalman Shan DO Entered By: Kalman Shan on 03/11/2021 16:08:57 -------------------------------------------------------------------------------- Physician Orders Details Patient Name: Date of Service: Josefa Half [facility] 03/11/2021 3:15 PM Medical Record Number: [id number] Patient Account Number: [id number] Date of Birth/Sex: Treating RN: 1990-06-27 (31 y.o. 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 - with Dr. Heber [place] Return A Bathing/ Shower/ Hygiene May shower with protection but do not get wound dressing(s) wet. Off-Loading Other: - Open T Boot (given by Podiatry) oe Additional Orders / Instructions Follow Nutritious Diet - Monitor/Control Blood Sugars to aide in wound healing. Wound Treatment Wound #1 - Foot Wound Laterality: Dorsal, Left Cleanser: Soap and Water 1 x Per [id number] Days Discharge Instructions: May shower and wash wound with dial antibacterial soap and water prior to dressing change. Prim Dressing: Hydrofera Blue Ready Foam, 4x5 in (DME) (Generic) 1 x Per Day/15 Days ary Discharge Instructions: Apply over Santyl Prim Dressing: Santyl Ointment 1 x Per Day/15 Days ary Discharge Instructions: ***APPLY ONLY TO SLOUGH (  YELLOW/BROWN) AREAS*** Secondary Dressing: Woven Gauze Sponge, Non-Sterile 4x4 in (DME) (Generic) 1 x Per Day/15 Days Discharge Instructions: Apply over primary dressing as directed. Secondary Dressing: ABD Pad, 5x9 (DME) (Generic) 1 x Per Day/15 Days Discharge Instructions: Apply over primary dressing as directed. Secured With: [employer], 4.5x3.1 (in/yd) (DME) (Generic) 1 x Per Day/15 Days Discharge Instructions: Secure with Kerlix as directed. Secured With: 61M Medipore H Soft Cloth Surgical T ape, 4 x 10 (in/yd) (DME) (Generic) 1 x Per Day/15 Days Discharge Instructions: Secure with tape as directed. Wound #2 - Foot Wound Laterality: Left, Lateral Cleanser: Soap and Water 1 x Per Day/30 Days Discharge Instructions: May shower and wash wound with dial antibacterial soap and water prior to dressing change. Prim Dressing: Santyl Ointment 1 x Per Day/30  Days ary Discharge Instructions: Apply nickel thick amount to wound bed as instructed Secondary Dressing: Woven Gauze Sponge, Non-Sterile 4x4 in (DME) (Generic) 1 x Per Day/30 Days Discharge Instructions: Apply over primary dressing as directed. Secured With: [employer], 4.5x3.1 (in/yd) (DME) (Generic) 1 x Per Day/30 Days Discharge Instructions: Secure with Kerlix as directed. Secured With: 61M Medipore H Soft Cloth Surgical T ape, 4 x 10 (in/yd) (DME) (Generic) 1 x Per Day/30 Days Discharge Instructions: Secure with tape as directed. Electronic Signature(s) Signed: 03/11/2021 4:10:00 PM By: Kalman Shan DO Entered By: Kalman Shan on 03/11/2021 16:07:37 -------------------------------------------------------------------------------- Problem List Details Patient Name: Date of Service: Josefa Half [facility] 03/11/2021 3:15 PM Medical Record Number: [id number] Patient Account Number: [id number] Date of Birth/Sex: Treating RN: [birth year] (31 y.o. 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 02/14/2021 No Yes Z89.422 Acquired absence of other left toe(s) 02/14/2021 No Yes I10 Essential (primary) hypertension 02/14/2021 No Yes Inactive Problems Resolved Problems Electronic Signature(s) Signed: 03/11/2021 4:10:00 PM By: Kalman Shan DO Entered By: Kalman Shan on 03/11/2021 16:02:27 -------------------------------------------------------------------------------- Progress Note Details Patient Name: Date of Service: Josefa Half [facility] 03/11/2021 3:15 PM Medical Record Number: [id number] Patient Account Number: [id number] Date of Birth/Sex: Treating RN: 08-Sep-1990 (31 y.o. [name] Primary Care Provider: Arlester Marker Other Clinician: Referring Provider: Treating Provider/Extender: Fermin Schwab in Treatment: 3 Subjective Chief Complaint Information obtained from Patient Left foot wounds History of Present Illness (HPI) Admission 02/14/2021 Ms. 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 While she was treating this wound she subsequently developed 3 additional wounds. These are located to the dorsal aspect and lateral aspect of the left foot and third toe. The wounds started out as blisters and she thinks these developed due to putting on the dressing too tightly around her foot. Currently she has been using Santyl to the wound beds however she has been leaving this to air dry throughout the day. She reports finishing doxycycline and currently denies signs of infection. 1/6; patient presents for  follow-up. She has been using Santyl to the areas of eschar and Hydrofera Blue to the toe wound. She reports improvement in wound healing. She has no issues or complaints today. She denies signs of infection. 1/23; patient presents for follow-up. She has been using Santyl to the wound beds without issues. She denies signs of infection. Patient History Information obtained from Patient. Family History Cancer - Maternal Grandparents,Mother, Diabetes - Mother, No family history of Heart Disease, Hereditary Spherocytosis, Hypertension, Kidney Disease, Lung Disease, Seizures,  Stroke, Thyroid Problems, Tuberculosis. Social History Never smoker, Marital Status - Single, Alcohol Use - Rarely, Drug Use - No History, Caffeine Use - Moderate. Medical History Hematologic/Lymphatic Patient has history of Anemia Endocrine Patient has history of Type I Diabetes - Since 2008 Neurologic Patient has history of Neuropathy Medical A Surgical History Notes nd Cardiovascular Tachycardia Gastrointestinal GERD Objective Constitutional respirations regular, non-labored and within target range for patient.. Vitals Time Taken: 3:24 PM, Height: 68 in, Weight: 183 lbs, BMI: 27.8, Temperature: 98.4 F, Pulse: 102 bpm, Respiratory Rate: 16 breaths/min, Blood Pressure: 103/72 mmHg, Capillary Blood Glucose: 370 mg/dl. General Notes: glucose per pt report Psychiatric pleasant and cooperative. General Notes: Left foot: T the dorsal aspect and lateral aspect there are wounds with granulation tissue and nonviable tissue present. There is epithelialization o to the third toe previous wound site. No surrounding signs of infection. Difficult to palpate pedal pulses. Integumentary (Hair, Skin) Wound #1 status is Open. Original cause of wound was Gradually Appeared. The date acquired was: 01/04/2021. The wound has been in treatment 3 weeks. The wound is located on the Left,Dorsal Foot. The wound measures 6.5cm length x 4cm width x 0.1cm depth; 20.42cm^2 area and 2.042cm^3 volume. There is Fat Layer (Subcutaneous Tissue) exposed. There is no tunneling or undermining noted. There is a medium amount of serosanguineous drainage noted. The wound margin is distinct with the outline attached to the wound base. There is medium (34-66%) red, pink, hyper - granulation within the wound bed. There is a medium (34-66%) amount of necrotic tissue within the wound bed including Adherent Slough. Wound #2 status is Open. Original cause of wound was Blister. The date acquired was: 01/04/2021. The wound has  been in treatment 3 weeks. The wound is located on the Left,Lateral Foot. The wound measures 2.8cm length x 1.9cm width x 0.1cm depth; 4.178cm^2 area and 0.418cm^3 volume. There is Fat Layer (Subcutaneous Tissue) exposed. There is no tunneling or undermining noted. There is a medium amount of serosanguineous drainage noted. The wound margin is distinct with the outline attached to the wound base. There is small (1-33%) red granulation within the wound bed. There is a large (67-100%) amount of necrotic tissue within the wound bed including Eschar and Adherent Slough. Wound #3 status is Healed - Epithelialized. Original cause of wound was Gradually Appeared. The date acquired was: 01/04/2021. The wound has been in treatment 3 weeks. The wound is located on the Left T Third. The wound measures 0cm length x 0cm width x 0cm depth; 0cm^2 area and 0cm^3 volume. oe There is no tunneling or undermining noted. There is a none present amount of drainage noted. The wound margin is distinct with the outline attached to the wound base. There is no granulation within the wound bed. There is no necrotic tissue within the wound bed. Assessment Active Problems ICD-10 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ty Type 1 diabetes mellitus with foot ulcer Type 1 diabetes mellitus with  diabetic polyneuropathy Acquired absence of other left toe(s) Essential (primary) hypertension Patient's wounds have shown improvement in size and appearance as last clinic visit. The third toe is now healed. I debrided nonviable tissue. No signs of surrounding infection. I recommended continuing Santyl to the areas of nonviable tissue and start Hydrofera Blue to the areas of granulation. I used silver nitrate to the hyper granulated areas. Follow-up in 1 week. Procedures Wound #1 Pre-procedure diagnosis of Wound #1 is a Diabetic Wound/Ulcer of the Lower Extremity located on the Left,Dorsal Foot  .Severity of Tissue Pre Debridement is: Fat layer exposed. There was a Excisional Skin/Subcutaneous Tissue Debridement with a total area of 12 sq cm performed by Kalman Shan, DO. With the following instrument(s): Curette to remove Viable and Non-Viable tissue/material. Material removed includes Subcutaneous Tissue and Slough and. No specimens were taken. A time out was conducted at 15:43, prior to the start of the procedure. A Minimum amount of bleeding was controlled with Pressure. The procedure was tolerated well with a pain level of 0 throughout and a pain level of 0 following the procedure. Post Debridement Measurements: 6.5cm length x 4cm width x 0.1cm depth; 2.042cm^3 volume. Character of Wound/Ulcer Post Debridement requires further debridement. Severity of Tissue Post Debridement is: Fat layer exposed. Post procedure Diagnosis Wound #1: Same as Pre-Procedure Wound #2 Pre-procedure diagnosis of Wound #2 is a Diabetic Wound/Ulcer of the Lower Extremity located on the Left,Lateral Foot .Severity of Tissue Pre Debridement is: Fat layer exposed. There was a Excisional Skin/Subcutaneous Tissue Debridement with a total area of 5.32 sq cm performed by Kalman Shan, DO. With the following instrument(s): Curette to remove Viable and Non-Viable tissue/material. Material removed includes Subcutaneous Tissue and Slough and. No specimens were taken. A time out was conducted at 15:43, prior to the start of the procedure. A Minimum amount of bleeding was controlled with Pressure. The procedure was tolerated well with a pain level of 0 throughout and a pain level of 0 following the procedure. Post Debridement Measurements: 2.8cm length x 1.9cm width x 0.1cm depth; 0.418cm^3 volume. Character of Wound/Ulcer Post Debridement requires further debridement. Severity of Tissue Post Debridement is: Fat layer exposed. Post procedure Diagnosis Wound #2: Same as Pre-Procedure Plan Follow-up  Appointments: Return Appointment in 1 week. - with Dr. Heber  Bathing/ Shower/ Hygiene: May shower with protection but do not get wound dressing(s) wet. Off-Loading: Other: - Open T Boot (given by Podiatry) oe Additional Orders / Instructions: Follow Nutritious Diet - Monitor/Control Blood Sugars to aide in wound healing. WOUND #1: - Foot Wound Laterality: Dorsal, Left Cleanser: Soap and Water 1 x Per Day/15 Days Discharge Instructions: May shower and wash wound with dial antibacterial soap and water prior to dressing change. Prim Dressing: Hydrofera Blue Ready Foam, 4x5 in (DME) (Generic) 1 x Per Day/15 Days ary Discharge Instructions: Apply over Santyl Prim Dressing: Santyl Ointment 1 x Per Day/15 Days ary Discharge Instructions: ***APPLY ONLY TO SLOUGH (YELLOW/BROWN) AREAS*** Secondary Dressing: Woven Gauze Sponge, Non-Sterile 4x4 in (DME) (Generic) 1 x Per Day/15 Days Discharge Instructions: Apply over primary dressing as directed. Secondary Dressing: ABD Pad, 5x9 (DME) (Generic) 1 x Per Day/15 Days Discharge Instructions: Apply over primary dressing as directed. Secured With: [employer], 4.5x3.1 (in/yd) (DME) (Generic) 1 x Per Day/15 Days Discharge Instructions: Secure with Kerlix as directed. Secured With: 58M Medipore H Soft Cloth Surgical T ape, 4 x 10 (in/yd) (DME) (Generic) 1 x Per Day/15 Days Discharge Instructions: Secure with tape as directed. WOUND #  2: - Foot Wound Laterality: Left, Lateral Cleanser: Soap and Water 1 x Per Day/30 Days Discharge Instructions: May shower and wash wound with dial antibacterial soap and water prior to dressing change. Prim Dressing: Santyl Ointment 1 x Per Day/30 Days ary Discharge Instructions: Apply nickel thick amount to wound bed as instructed Secondary Dressing: Woven Gauze Sponge, Non-Sterile 4x4 in (DME) (Generic) 1 x Per Day/30 Days Discharge Instructions: Apply over primary dressing as directed. Secured With: [employer], 4.5x3.1 (in/yd) (DME) (Generic) 1 x Per Day/30 Days Discharge Instructions: Secure with Kerlix as directed. Secured With: 10M Medipore H Soft Cloth Surgical T ape, 4 x 10 (in/yd) (DME) (Generic) 1 x Per Day/30 Days Discharge Instructions: Secure with tape as directed. 1. In office sharp debridement 2. Santyl and Hydrofera Blue 3. Follow-up in 1 week 4. Silver nitrate Electronic Signature(s) Signed: 03/11/2021 4:10:00 PM By: Kalman Shan DO Entered By: Kalman Shan on 03/11/2021 16:08:37 -------------------------------------------------------------------------------- HxROS Details Patient Name: Date of Service: Josefa Half [facility] 03/11/2021 3:15 PM Medical Record Number: [id number] Patient Account Number: [id number] Date of Birth/Sex: Treating RN: Jun 24, 1990 (32 y.o. [name] Primary Care Provider: Arlester Marker Other Clinician: Referring Provider: Treating Provider/Extender: Fermin Schwab in Treatment: 3 Information Obtained From Patient Hematologic/Lymphatic Medical History: Positive for: Anemia Cardiovascular Medical History: Past Medical History Notes: Tachycardia Gastrointestinal Medical History: Past Medical History Notes: GERD Endocrine Medical History: Positive for: Type I Diabetes - Since 2008 Treated with: Insulin Blood sugar tested every day: Yes Tested : Dexcom Neurologic Medical History: Positive for: Neuropathy Immunizations Pneumococcal Vaccine: Received Pneumococcal Vaccination: Yes Received Pneumococcal Vaccination On or After 60th Birthday: No Implantable Devices None Family and Social History Cancer: Yes - Maternal Grandparents,Mother; Diabetes: Yes - Mother; Heart Disease: No; Hereditary Spherocytosis: No; Hypertension: No; Kidney Disease: No; Lung Disease: No; Seizures: No; Stroke: No; Thyroid Problems: No; Tuberculosis: No; Never smoker; Marital Status - Single; Alcohol Use: Rarely;  Drug Use: No History; Caffeine Use: Moderate; Financial Concerns: No; Food, Clothing or Shelter Needs: No; Support System Lacking: No; Transportation Concerns: No Electronic Signature(s) Signed: 03/11/2021 4:10:00 PM By: Kalman Shan DO Signed: 03/12/2021 4:23:30 PM By: Lorrin Jackson Entered By: Kalman Shan on 03/11/2021 16:06:27 -------------------------------------------------------------------------------- SuperBill Details Patient Name: Date of Service: Josefa Half [facility] 03/11/2021 Medical Record Number: [id number] Patient Account Number: [id number] Date of Birth/Sex: Treating RN: 1990/04/06 (31 y.o. 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 Z89.422 Acquired absence of other left toe(s) I10 Essential (primary) hypertension Facility Procedures CPT4 Code: [id number] Description: [id number] - DEB SUBQ TISSUE 20 SQ CM/< ICD-10 Diagnosis Description L97.528 Non-pressure chronic ulcer of other part of left foot with other specified seve E10.621 Type 1 diabetes mellitus with foot ulcer Modifier: rity Quantity: 1 Physician Procedures : CPT4 Code Description Modifier [id number] - WC PHYS SUBQ TISS 20 SQ CM ICD-10 Diagnosis Description 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

## 2021-03-13 ENCOUNTER — Ambulatory Visit (HOSPITAL_COMMUNITY)
Admission: RE | Admit: 2021-03-13 | Discharge: 2021-03-13 | Disposition: A | Discharge status: Home / Self Care | Payer: 59 | Source: Ambulatory Visit | Attending: Internal Medicine | Admitting: Internal Medicine

## 2021-03-13 ENCOUNTER — Encounter: Payer: Self-pay | Admitting: Nutrition

## 2021-03-13 ENCOUNTER — Other Ambulatory Visit: Payer: Self-pay

## 2021-03-13 ENCOUNTER — Telehealth: Payer: Self-pay | Admitting: Nutrition

## 2021-03-13 ENCOUNTER — Encounter: Payer: 59 | Admitting: Nutrition

## 2021-03-13 DIAGNOSIS — L97329 Non-pressure chronic ulcer of left ankle with unspecified severity: Secondary | ICD-10-CM | POA: Insufficient documentation

## 2021-03-13 DIAGNOSIS — L97309 Non-pressure chronic ulcer of unspecified ankle with unspecified severity: Secondary | ICD-10-CM | POA: Diagnosis present

## 2021-03-13 DIAGNOSIS — I10 Essential (primary) hypertension: Secondary | ICD-10-CM | POA: Insufficient documentation

## 2021-03-13 DIAGNOSIS — L97529 Non-pressure chronic ulcer of other part of left foot with unspecified severity: Secondary | ICD-10-CM

## 2021-03-13 DIAGNOSIS — Z89422 Acquired absence of other left toe(s): Secondary | ICD-10-CM | POA: Insufficient documentation

## 2021-03-13 NOTE — Telephone Encounter (Signed)
LVM on 1/23 and again now that I am not able to do her pump start this afternoon.  Dates/times given for availability for pump trainings.

## 2021-03-18 ENCOUNTER — Encounter (HOSPITAL_BASED_OUTPATIENT_CLINIC_OR_DEPARTMENT_OTHER): Payer: 59 | Admitting: Internal Medicine

## 2021-03-18 ENCOUNTER — Other Ambulatory Visit: Payer: Self-pay

## 2021-03-18 DIAGNOSIS — L97528 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ty: Secondary | ICD-10-CM | POA: Diagnosis not present

## 2021-03-18 DIAGNOSIS — E10621 Type 1 diabetes mellitus with foot ulcer: Secondary | ICD-10-CM | POA: Diagnosis not present

## 2021-03-18 NOTE — Progress Notes (Addendum)
Kelli, Hudson ([id number]) Visit Report for 03/18/2021 Arrival Information Details Patient Name: Date of Service: Kelli Hudson [facility] 03/18/2021 3:00 PM Medical Record Number: [id number] Patient Account Number: [id number] Date of Birth/Sex: Treating RN: [birth year] (31 y.o. Kelli Hudson Primary Care Hersh Minney: Arlester Marker Other Clinician: Referring Adylene Dlugosz: Treating Ventura Leggitt/Extender: Fermin Schwab in Treatment: 4 Visit Information History Since Last Visit Added or deleted any medications: No Patient Arrived: Ambulatory Any new allergies or adverse reactions: No Arrival Time: 15:03 Had a fall or experienced change in No Accompanied By: fiance activities of daily living that may affect Transfer Assistance: None risk of falls: Patient Identification Verified: Yes Signs or symptoms of abuse/neglect since last visito No Secondary Verification Process Completed: Yes Hospitalized since last visit: No Patient Requires Transmission-Based Precautions: No Implantable device outside of the clinic excluding No Patient Has Alerts: No cellular tissue based products placed in the center since last visit: Has Dressing in Place as Prescribed: Yes Pain Present Now: No Electronic Signature(s) Signed: 03/18/2021 5:09:42 PM By: Baruch Gouty RN, BSN Entered By: Baruch Gouty on 03/18/2021 15:06:37 -------------------------------------------------------------------------------- Encounter Discharge Information Details Patient Name: Date of Service: Kelli Hudson [facility] 03/18/2021 3:00 PM Medical Record Number: [id number] Patient Account Number: [id number] Date of Birth/Sex: Treating RN: 1991/02/06 (31 y.o. Kelli Hudson Primary Care Justise Ehmann: Arlester Marker Other Clinician: Referring Daneesha Quinteros: Treating Nicasio Barlowe/Extender: Fermin Schwab in Treatment: 4 Encounter Discharge Information Items Post Procedure  Vitals Discharge Condition: Stable Temperature (F): 98.5 Ambulatory Status: Ambulatory Pulse (bpm): 108 Discharge Destination: Home Respiratory Rate (breaths/min): 18 Transportation: Private Auto Blood Pressure (mmHg): 93/58 Accompanied By: fiance Schedule Follow-up Appointment: Yes Clinical Summary of Care: Patient Declined Electronic Signature(s) Signed: 03/18/2021 5:09:42 PM By: Baruch Gouty RN, BSN Entered By: Baruch Gouty on 03/18/2021 15:37:44 -------------------------------------------------------------------------------- Lower Extremity Assessment Details Patient Name: Date of Service: Kelli Hudson [facility] 03/18/2021 3:00 PM Medical Record Number: [id number] Patient Account Number: [id number] Date of Birth/Sex: Treating RN: [birth year] (31 y.o. Kelli Hudson Primary Care Glennys Schorsch: Arlester Marker Other Clinician: Referring Mabel Roll: Treating Savreen Gebhardt/Extender: Fermin Schwab in Treatment: 4 Edema Assessment Assessed: [name]: No] [Right: No] Edema: [Left: N] [Right: o] Calf Left: Right: Point of Measurement: 31 cm From Medial Instep 35.5 cm Ankle Left: Right: Point of Measurement: 10 cm From Medial Instep 22 cm Vascular Assessment Pulses: Dorsalis Pedis Palpable: [Left:Yes] Electronic Signature(s) Signed: 03/18/2021 5:09:42 PM By: Baruch Gouty RN, BSN Entered By: Baruch Gouty on 03/18/2021 15:11:54 -------------------------------------------------------------------------------- Multi Wound Chart Details Patient Name: Date of Service: Kelli Hudson [facility] 03/18/2021 3:00 PM Medical Record Number: [id number] Patient Account Number: [id number] Date of Birth/Sex: Treating RN: 1990/05/14 (31 y.o. Kelli Hudson Primary Care Roni Scow: Arlester Marker Other Clinician: Referring Coleson Kant: Treating Stephaniemarie Stoffel/Extender: Fermin Schwab in Treatment: 4 Vital Signs Height(in): 51 Capillary Blood  Glucose(mg/dl): 174 Weight(lbs): 183 Pulse(bpm): 108 Body Mass Index(BMI): 27.8 Blood Pressure(mmHg): 93/58 Temperature(F): 98.5 Respiratory Rate(breaths/min): 18 Photos: [1:Left, Dorsal Foot] [2:Left, Lateral Foot] [N/A:N/A N/A] Wound Location: [1:Gradually Appeared] [2:Blister] [N/A:N/A] Wounding Event: [1:Diabetic Wound/Ulcer of the Lower] [2:Diabetic Wound/Ulcer of the Lower] [N/A:N/A] Primary Etiology: [1:Extremity Anemia, Type I Diabetes, Neuropathy Anemia, Type I Diabetes, Neuropathy N/A] [2:Extremity] Comorbid History: [1:01/04/2021] [2:01/04/2021] [N/A:N/A] Date Acquired: [1:4] [2:4] [N/A:N/A] Weeks of Treatment: [1:Open] [2:Open] [N/A:N/A] Wound Status: [1:No] [2:No] [N/A:N/A] Wound Recurrence: [1:5.8x2.6x0.1] [2:2.5x2.3x0.1] [N/A:N/A] Measurements L x W x D (cm) [1:11.844] [id number] [N/A:N/A] A (cm) :  rea [1:1.184] [2:0.452] [N/A:N/A] Volume (cm) : [1:54.20%] [2:55.10%] [N/A:N/A] % Reduction in A [1:rea: 54.20%] [2:55.10%] [N/A:N/A] % Reduction in Volume: [1:Grade 1] [2:Grade 1] [N/A:N/A] Classification: [1:Medium] [2:Medium] [N/A:N/A] Exudate A mount: [1:Serosanguineous] [2:Serosanguineous] [N/A:N/A] Exudate Type: [1:red, brown] [2:red, brown] [N/A:N/A] Exudate Color: [1:Flat and Intact] [2:Distinct, outline attached] [N/A:N/A] Wound Margin: [1:Large (67-100%)] [2:Small (1-33%)] [N/A:N/A] Granulation A mount: [1:Red, Pink, Hyper-granulation] [2:Red] [N/A:N/A] Granulation Quality: [1:Small (1-33%)] [2:Large (67-100%)] [N/A:N/A] Necrotic A mount: [1:Fat Layer (Subcutaneous Tissue): Yes Fat Layer (Subcutaneous Tissue): Yes N/A] Exposed Structures: [1:Fascia: No Tendon: No Muscle: No Joint: No Bone: No Small (1-33%)] [2:Fascia: No Tendon: No Muscle: No Joint: No Bone: No Small (1-33%)] [N/A:N/A] Epithelialization: [1:Debridement - Excisional] [2:Debridement - Excisional] [N/A:N/A] Debridement: Pre-procedure Verification/Time Out 15:20 [2:15:20] [N/A:N/A] Taken:  [1:Other] [2:Other] [N/A:N/A] Pain Control: [1:Subcutaneous, Slough] [2:Subcutaneous, Slough] [N/A:N/A] Tissue Debrided: [1:Skin/Subcutaneous Tissue] [2:Skin/Subcutaneous Tissue] [N/A:N/A] Level: [1:4] [2:5.75] [N/A:N/A] Debridement A (sq cm): [1:rea Blade, Curette, Forceps] [2:Blade, Curette, Forceps] [N/A:N/A] Instrument: [1:Minimum] [2:Minimum] [N/A:N/A] Bleeding: [1:Silver Nitrate] [2:Pressure] [N/A:N/A] Hemostasis A chieved: [1:0] [2:0] [N/A:N/A] Procedural Pain: [1:0] [2:0] [N/A:N/A] Post Procedural Pain: [1:Procedure was tolerated well] [2:Procedure was tolerated well] [N/A:N/A] Debridement Treatment Response: [1:5.8x2.6x0.1] [2:2.5x2.3x0.1] [N/A:N/A] Post Debridement Measurements L x W x D (cm) [1:1.184] [2:0.452] [N/A:N/A] Post Debridement Volume: (cm) [1:Debridement] [2:Debridement] [N/A:N/A] Treatment Notes Wound #1 (Foot) Wound Laterality: Dorsal, Left Cleanser Soap and Water Discharge Instruction: May shower and wash wound with dial antibacterial soap and water prior to dressing change. Peri-Wound Care Topical Primary Dressing Hydrofera Blue Ready Foam, 4x5 in Discharge Instruction: Apply over Santyl Santyl Ointment Discharge Instruction: ***APPLY ONLY TO SLOUGH (YELLOW/BROWN) AREAS*** Secondary Dressing Woven Gauze Sponge, Non-Sterile 4x4 in Discharge Instruction: Apply over primary dressing as directed. Secured With [employer], 4.5x3.1 (in/yd) Discharge Instruction: Secure with Kerlix as directed. 38M Medipore H Soft Cloth Surgical T ape, 4 x 10 (in/yd) Discharge Instruction: Secure with tape as directed. Compression Wrap Compression Stockings Add-Ons Wound #2 (Foot) Wound Laterality: Left, Lateral Cleanser Soap and Water Discharge Instruction: May shower and wash wound with dial antibacterial soap and water prior to dressing change. Peri-Wound Care Topical Primary Dressing Santyl Ointment Discharge Instruction: Apply nickel thick amount to wound  bed as instructed Secondary Dressing Woven Gauze Sponge, Non-Sterile 4x4 in Discharge Instruction: Apply over primary dressing as directed. Secured With [employer], 4.5x3.1 (in/yd) Discharge Instruction: Secure with Kerlix as directed. 38M Medipore H Soft Cloth Surgical T ape, 4 x 10 (in/yd) Discharge Instruction: Secure with tape as directed. Compression Wrap Compression Stockings Add-Ons Electronic Signature(s) Signed: 03/19/2021 12:51:24 PM By: Kalman Shan DO Signed: 03/19/2021 4:25:26 PM By: Fara Chute By: Kalman Shan on 03/19/2021 12:38:52 -------------------------------------------------------------------------------- Multi-Disciplinary Care Plan Details Patient Name: Date of Service: Kelli Hudson [facility] 03/18/2021 3:00 PM Medical Record Number: [id number] Patient Account Number: [id number] Date of Birth/Sex: Treating RN: October 23, 1990 (31 y.o. Kelli Hudson Primary Care Traeger Sultana: Arlester Marker Other Clinician: Referring Jose Alleyne: Treating Xandrea Clarey/Extender: Fermin Schwab in Treatment: 4 Active Inactive Nutrition Nursing Diagnoses: Impaired glucose control: actual or potential Goals: Patient/caregiver verbalizes understanding of need to maintain therapeutic glucose control per primary care physician Date Initiated: 02/14/2021 Target Resolution Date: 03/22/2021 Goal Status: Active Interventions: Assess HgA1c results as ordered upon admission and as needed Provide education on elevated blood sugars and impact on wound healing Notes: Wound/Skin Impairment Nursing Diagnoses: Impaired tissue integrity Goals: Patient/caregiver will verbalize understanding of skin care regimen Date Initiated: 02/14/2021 Target Resolution Date: 03/22/2021 Goal Status:  Active Ulcer/skin breakdown will have a volume reduction of 30% by week 4 Date Initiated: 02/14/2021 Target Resolution Date: 03/22/2021 Goal Status:  Active Interventions: Assess patient/caregiver ability to obtain necessary supplies Assess patient/caregiver ability to perform ulcer/skin care regimen upon admission and as needed Assess ulceration(s) every visit Provide education on ulcer and skin care Treatment Activities: Topical wound management initiated : 02/14/2021 Notes: Electronic Signature(s) Signed: 03/18/2021 5:09:42 PM By: Baruch Gouty RN, BSN Entered By: Baruch Gouty on 03/18/2021 15:16:43 -------------------------------------------------------------------------------- Pain Assessment Details Patient Name: Date of Service: Kelli Hudson [facility] 03/18/2021 3:00 PM Medical Record Number: [id number] Patient Account Number: [id number] Date of Birth/Sex: Treating RN: Jun 20, 1990 (31 y.o. Kelli Hudson Primary Care Jonatha Gagen: Arlester Marker Other Clinician: Referring Will Schier: Treating Magon Croson/Extender: Fermin Schwab in Treatment: 4 Active Problems Location of Pain Severity and Description of Pain Patient Has Paino No Site Locations Rate the pain. Current Pain Level: 0 Pain Management and Medication Current Pain Management: Electronic Signature(s) Signed: 03/18/2021 5:09:42 PM By: Baruch Gouty RN, BSN Entered By: Baruch Gouty on 03/18/2021 15:09:38 -------------------------------------------------------------------------------- Patient/Caregiver Education Details Patient Name: Date of Service: Kelli Hudson [facility] 1/30/2023andnbsp3:00 PM Medical Record Number: [id number] Patient Account Number: [id number] Date of Birth/Gender: Treating RN: Jul 22, 1990 (31 y.o. Kelli Hudson Primary Care Physician: Arlester Marker Other Clinician: Referring Physician: Treating Physician/Extender: Fermin Schwab in Treatment: 4 Education Assessment Education Provided To: Patient Education Topics Provided Elevated Blood Sugar/ Impact on  Healing: Methods: Explain/Verbal, Printed Responses: Reinforcements needed, State content correctly Wound/Skin Impairment: Methods: Explain/Verbal Responses: Reinforcements needed, State content correctly Electronic Signature(s) Signed: 03/18/2021 5:09:42 PM By: Baruch Gouty RN, BSN Entered By: Baruch Gouty on 03/18/2021 15:18:41 -------------------------------------------------------------------------------- Wound Assessment Details Patient Name: Date of Service: Kelli Hudson [facility] 03/18/2021 3:00 PM Medical Record Number: [id number] Patient Account Number: [id number] Date of Birth/Sex: Treating RN: 08/25/1990 (31 y.o. Kelli Hudson Primary Care Jakori Burkett: Arlester Marker Other Clinician: Referring Alisi Lupien: Treating Konstantine Gervasi/Extender: Fermin Schwab in Treatment: 4 Wound Status Wound Number: 1 Primary Etiology: Diabetic Wound/Ulcer of the Lower Extremity Wound Location: Left, Dorsal Foot Wound Status: Open Wounding Event: Gradually Appeared Comorbid History: Anemia, Type I Diabetes, Neuropathy Date Acquired: 01/04/2021 Weeks Of Treatment: 4 Clustered Wound: No Photos Wound Measurements Length: (cm) 5.8 Width: (cm) 2.6 Depth: (cm) 0.1 Area: (cm) 11.844 Volume: (cm) 1.184 % Reduction in Area: 54.2% % Reduction in Volume: 54.2% Epithelialization: Small (1-33%) Tunneling: No Undermining: No Wound Description Classification: Grade 1 Wound Margin: Flat and Intact Exudate Amount: Medium Exudate Type: Serosanguineous Exudate Color: red, brown Foul Odor After Cleansing: No Slough/Fibrino Yes Wound Bed Granulation Amount: Large (67-100%) Exposed Structure Granulation Quality: Red, Pink, Hyper-granulation Fascia Exposed: No Necrotic Amount: Small (1-33%) Fat Layer (Subcutaneous Tissue) Exposed: Yes Necrotic Quality: Adherent Slough Tendon Exposed: No Muscle Exposed: No Joint Exposed: No Bone Exposed: No Treatment  Notes Wound #1 (Foot) Wound Laterality: Dorsal, Left Cleanser Soap and Water Discharge Instruction: May shower and wash wound with dial antibacterial soap and water prior to dressing change. Peri-Wound Care Topical Primary Dressing Hydrofera Blue Ready Foam, 4x5 in Discharge Instruction: Apply over Santyl Santyl Ointment Discharge Instruction: ***APPLY ONLY TO SLOUGH (YELLOW/BROWN) AREAS*** Secondary Dressing Woven Gauze Sponge, Non-Sterile 4x4 in Discharge Instruction: Apply over primary dressing as directed. Secured With [employer], 4.5x3.1 (in/yd) Discharge Instruction: Secure with Kerlix as directed. 44M Medipore H Soft Cloth Surgical T ape, 4 x 10 (in/yd) Discharge  Instruction: Secure with tape as directed. Compression Wrap Compression Stockings Add-Ons Electronic Signature(s) Signed: 03/18/2021 5:09:42 PM By: Baruch Gouty RN, BSN Signed: 03/18/2021 5:28:28 PM By: Dellie Catholic RN Entered By: Dellie Catholic on 03/18/2021 15:16:26 -------------------------------------------------------------------------------- Wound Assessment Details Patient Name: Date of Service: Kelli Hudson [facility] 03/18/2021 3:00 PM Medical Record Number: [id number] Patient Account Number: [id number] Date of Birth/Sex: Treating RN: 1990-10-17 (31 y.o. Kelli Hudson Primary Care Lashonna Rieke: Arlester Marker Other Clinician: Referring Shabree Tebbetts: Treating Lechelle Wrigley/Extender: Fermin Schwab in Treatment: 4 Wound Status Wound Number: 2 Primary Etiology: Diabetic Wound/Ulcer of the Lower Extremity Wound Location: Left, Lateral Foot Wound Status: Open Wounding Event: Blister Comorbid History: Anemia, Type I Diabetes, Neuropathy Date Acquired: 01/04/2021 Weeks Of Treatment: 4 Clustered Wound: No Photos Wound Measurements Length: (cm) 2.5 Width: (cm) 2.3 Depth: (cm) 0.1 Area: (cm) 4.516 Volume: (cm) 0.452 % Reduction in Area: 55.1% % Reduction in  Volume: 55.1% Epithelialization: Small (1-33%) Tunneling: No Undermining: No Wound Description Classification: Grade 1 Wound Margin: Distinct, outline attached Exudate Amount: Medium Exudate Type: Serosanguineous Exudate Color: red, brown Foul Odor After Cleansing: No Slough/Fibrino Yes Wound Bed Granulation Amount: Small (1-33%) Exposed Structure Granulation Quality: Red Fascia Exposed: No Necrotic Amount: Large (67-100%) Fat Layer (Subcutaneous Tissue) Exposed: Yes Necrotic Quality: Adherent Slough Tendon Exposed: No Muscle Exposed: No Joint Exposed: No Bone Exposed: No Treatment Notes Wound #2 (Foot) Wound Laterality: Left, Lateral Cleanser Soap and Water Discharge Instruction: May shower and wash wound with dial antibacterial soap and water prior to dressing change. Peri-Wound Care Topical Primary Dressing Santyl Ointment Discharge Instruction: Apply nickel thick amount to wound bed as instructed Secondary Dressing Woven Gauze Sponge, Non-Sterile 4x4 in Discharge Instruction: Apply over primary dressing as directed. Secured With [employer], 4.5x3.1 (in/yd) Discharge Instruction: Secure with Kerlix as directed. 38M Medipore H Soft Cloth Surgical T ape, 4 x 10 (in/yd) Discharge Instruction: Secure with tape as directed. Compression Wrap Compression Stockings Add-Ons Electronic Signature(s) Signed: 03/18/2021 5:09:42 PM By: Baruch Gouty RN, BSN Signed: 03/18/2021 5:28:28 PM By: Dellie Catholic RN Entered By: Dellie Catholic on 03/18/2021 15:17:11 -------------------------------------------------------------------------------- Vitals Details Patient Name: Date of Service: Kelli Hudson [facility] 03/18/2021 3:00 PM Medical Record Number: [id number] Patient Account Number: [id number] Date of Birth/Sex: Treating RN: [birth year] (30 y.o. Kelli Hudson Primary Care Alley Neils: Arlester Marker Other Clinician: Referring Ginna Schuur: Treating  Esperansa Sarabia/Extender: Fermin Schwab in Treatment: 4 Vital Signs Time Taken: 15:07 Temperature (F): 98.5 Height (in): 68 Pulse (bpm): 108 Source: Stated Respiratory Rate (breaths/min): 18 Weight (lbs): 183 Blood Pressure (mmHg): 93/58 Source: Stated Capillary Blood Glucose (mg/dl): 174 Body Mass Index (BMI): 27.8 Reference Range: 80 - 120 mg / dl Notes glucose per pt report at present per her meter Electronic Signature(s) Signed: 03/18/2021 5:09:42 PM By: Baruch Gouty RN, BSN Entered By: Baruch Gouty on 03/18/2021 15:08:58

## 2021-03-19 NOTE — Progress Notes (Signed)
Kelli Hudson ([id number]) Visit Report for 03/18/2021 Chief Complaint Document Details Patient Name: Date of Service: Kelli Hudson [facility] 03/18/2021 3:00 PM Medical Record Number: [id number] Patient Account Number: [id number] Date of Birth/Sex: Treating RN: 25-Nov-1990 (31 y.o. [name] Primary Care Provider: [name] Other Clinician: Referring Provider: Treating Provider/Extender: Kevin Fenton in Treatment: 4 Information Obtained from: Patient Chief Complaint Left foot wounds Electronic Signature(s) Signed: 03/19/2021 12:51:24 PM By: Geralyn Corwin DO Entered By: Geralyn Corwin on 03/19/2021 12:42:01 -------------------------------------------------------------------------------- Debridement Details Patient Name: Date of Service: Kelli Hudson [facility] 03/18/2021 3:00 PM Medical Record Number: [id number] Patient Account Number: [id number] Date of Birth/Sex: Treating RN: 04/09/1990 (31 y.o. [name] Primary Care Provider: [name] Other Clinician: Referring Provider: Treating Provider/Extender: Kevin Fenton in Treatment: 4 Debridement Performed for Assessment: Wound #2 Left,Lateral Foot Performed By: Physician Geralyn Corwin, DO Debridement Type: Debridement Severity of Tissue Pre Debridement: Fat layer exposed Level of Consciousness (Pre-procedure): Awake and Alert Pre-procedure Verification/Time Out Yes - 15:20 Taken: Start Time: 15:20 Pain Control: Other : benzocaine 20% spray T Area Debrided (L x W): otal 2.5 (cm) x 2.3 (cm) = 5.75 (cm) Tissue and other material debrided: Viable, Non-Viable, Slough, Subcutaneous, Slough Level: Skin/Subcutaneous Tissue Debridement Description: Excisional Instrument: Blade, Curette, Forceps Bleeding: Minimum Hemostasis Achieved: Pressure Procedural Pain: 0 Post Procedural Pain: 0 Response to Treatment: Procedure was tolerated  well Level of Consciousness (Post- Awake and Alert procedure): Post Debridement Measurements of Total Wound Length: (cm) 2.5 Width: (cm) 2.3 Depth: (cm) 0.1 Volume: (cm) 0.452 Character of Wound/Ulcer Post Debridement: Improved Severity of Tissue Post Debridement: Fat layer exposed Post Procedure Diagnosis Same as Pre-procedure Electronic Signature(s) Signed: 03/18/2021 5:09:42 PM By: Zenaida Deed RN, BSN Signed: 03/19/2021 12:51:24 PM By: Geralyn Corwin DO Entered By: Zenaida Deed on 03/18/2021 15:27:44 -------------------------------------------------------------------------------- Debridement Details Patient Name: Date of Service: Kelli Hudson [facility] 03/18/2021 3:00 PM Medical Record Number: [id number] Patient Account Number: [id number] Date of Birth/Sex: Treating RN: [birth year] (30 y.o. [name] Primary Care Provider: [name] Other Clinician: Referring Provider: Treating Provider/Extender: Kevin Fenton in Treatment: 4 Debridement Performed for Assessment: Wound #1 Left,Dorsal Foot Performed By: Physician Geralyn Corwin, DO Debridement Type: Debridement Severity of Tissue Pre Debridement: Fat layer exposed Level of Consciousness (Pre-procedure): Awake and Alert Pre-procedure Verification/Time Out Yes - 15:20 Taken: Start Time: 15:20 Pain Control: Other : benzocaine 20% spray T Area Debrided (L x W): otal 2 (cm) x 2 (cm) = 4 (cm) Tissue and other material debrided: Viable, Non-Viable, Slough, Subcutaneous, Slough Level: Skin/Subcutaneous Tissue Debridement Description: Excisional Instrument: Blade, Curette, Forceps Bleeding: Minimum Hemostasis Achieved: Silver Nitrate Procedural Pain: 0 Post Procedural Pain: 0 Response to Treatment: Procedure was tolerated well Level of Consciousness (Post- Awake and Alert procedure): Post Debridement Measurements of Total Wound Length: (cm) 5.8 Width: (cm) 2.6 Depth:  (cm) 0.1 Volume: (cm) 1.184 Character of Wound/Ulcer Post Debridement: Improved Severity of Tissue Post Debridement: Fat layer exposed Post Procedure Diagnosis Same as Pre-procedure Electronic Signature(s) Signed: 03/18/2021 5:09:42 PM By: Zenaida Deed RN, BSN Signed: 03/19/2021 12:51:24 PM By: Geralyn Corwin DO Entered By: Zenaida Deed on 03/18/2021 15:29:37 -------------------------------------------------------------------------------- HPI Details Patient Name: Date of Service: Kelli Hudson [facility] 03/18/2021 3:00 PM Medical Record Number: [id number] Patient Account Number: [id number] Date of Birth/Sex: Treating RN: May 30, 1990 (30 y.o. 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 While she was treating this wound she subsequently developed 3 additional wounds. These are located to the dorsal aspect and lateral aspect of the left foot and third toe. The wounds started out as blisters and she thinks these developed due to putting on the dressing too tightly around her foot. Currently she has been using Santyl to the wound beds however she has been leaving this to air dry throughout the day. She reports finishing doxycycline and currently denies signs of infection. 1/6; patient presents for follow-up. She has been using Santyl to the areas of eschar and Hydrofera Blue to the toe wound. She reports improvement in wound healing. She has no  issues or complaints today. She denies signs of infection. 1/23; patient presents for follow-up. She has been using Santyl to the wound beds without issues. She denies signs of infection. 1/30; patient presents for follow-up. She has been using Santyl to the lateral left foot wound and Santyl and Hydrofera Blue to the dorsal left foot wound. She denies signs of infection. She has no issues or complaints today. Electronic Signature(s) Signed: 03/19/2021 12:51:24 PM By: Geralyn CorwinHoffman, Woodard Perrell DO Entered By: Geralyn CorwinHoffman, Lowen Barringer on 03/19/2021 12:42:40 -------------------------------------------------------------------------------- Physical Exam Details Patient Name: Date of Service: [facility]00 PM Medical Record Number: [id number] Patient Account Number: [id number] Date of Birth/Sex: Treating RN: 07/31/1990 (31 y.o. Roel CluckF) [name], [name] Primary Care Provider: Herbie Drapeudd, Stephen Other Clinician: Referring Provider: Treating Provider/Extender: Kevin FentonHoffman, Treyana Sturgell Rudd, Stephen Weeks in Treatment: 4 Constitutional respirations regular, non-labored and within target range for patient.. Cardiovascular 2+ dorsalis pedis/posterior tibialis pulses. Psychiatric pleasant and cooperative. Notes Left foot: T the dorsal aspect there is an open wound with mostly hypergranulated tissue and some nonviable tissue present. T the lateral aspect there is o o mostly nonviable tissue throughout. No signs of surrounding infection. Electronic Signature(s) Signed: 03/19/2021 12:51:24 PM By: Geralyn CorwinHoffman, Temesha Queener DO Entered By: Geralyn CorwinHoffman, Tawfiq Favila on 03/19/2021 12:46:27 -------------------------------------------------------------------------------- Physician Orders Details Patient Name: Date of Service: [facility] 3:00 PM Medical Record Number: [id number] Patient Account Number: [id number] Date of Birth/Sex: Treating RN: 07/31/1990 (31 y.o. Tommye StandardF) Boehlein, Linda Primary Care Provider:  Herbie Drapeudd, Stephen Other Clinician: Referring Provider: Treating Provider/Extender: Kevin FentonHoffman, Marvell Tamer Rudd, Stephen Weeks in Treatment: 4 Verbal / Phone Orders: No Diagnosis Coding Follow-up Appointments ppointment in 1 week. - with Dr. Mikey BussingHoffman Return A Bathing/ Shower/ Hygiene May shower with protection but do not get wound dressing(s) wet. Off-Loading Other: - Open T Boot (given by Podiatry) oe Additional Orders / Instructions Follow Nutritious Diet - Monitor/Control Blood Sugars to aide in wound healing. Wound Treatment Wound #1 - Foot Wound Laterality: Dorsal, Left Cleanser: Soap and Water 1 x Per Day/15 Days Discharge Instructions: May shower and wash wound with dial antibacterial soap and water prior to dressing change. Prim Dressing: Hydrofera Blue Ready Foam, 4x5 in (Generic) 1 x Per Day/15 Days ary Discharge Instructions: Apply over Santyl Prim Dressing: Santyl Ointment 1 x Per Day/15 Days ary Discharge Instructions: ***APPLY ONLY TO SLOUGH (YELLOW/BROWN) AREAS*** Secondary Dressing:  Woven Gauze Sponge, Non-Sterile 4x4 in (Generic) 1 x Per Day/15 Days Discharge Instructions: Apply over primary dressing as directed. Secured With: [employer], 4.5x3.1 (in/yd) (Generic) 1 x Per Day/15 Days Discharge Instructions: Secure with Kerlix as directed. Secured With: 18M Medipore H Soft Cloth Surgical T ape, 4 x 10 (in/yd) (Generic) 1 x Per Day/15 Days Discharge Instructions: Secure with tape as directed. Wound #2 - Foot Wound Laterality: Left, Lateral Cleanser: Soap and Water 1 x Per Day/30 Days Discharge Instructions: May shower and wash wound with dial antibacterial soap and water prior to dressing change. Prim Dressing: Santyl Ointment 1 x Per Day/30 Days ary Discharge Instructions: Apply nickel thick amount to wound bed as instructed Secondary Dressing: Woven Gauze Sponge, Non-Sterile 4x4 in (Generic) 1 x Per Day/30 Days Discharge Instructions: Apply over primary dressing  as directed. Secured With: [employer], 4.5x3.1 (in/yd) (Generic) 1 x Per Day/30 Days Discharge Instructions: Secure with Kerlix as directed. Secured With: 18M Medipore H Soft Cloth Surgical T ape, 4 x 10 (in/yd) (Generic) 1 x Per Day/30 Days Discharge Instructions: Secure with tape as directed. Electronic Signature(s) Signed: 03/19/2021 12:51:24 PM By: Geralyn Corwin DO Previous Signature: 03/18/2021 5:09:42 PM Version By: Zenaida Deed RN, BSN Entered By: Geralyn Corwin on 03/19/2021 12:49:08 -------------------------------------------------------------------------------- Problem List Details Patient Name: Date of Service: Kelli Hudson [facility] 03/18/2021 3:00 PM Medical Record Number: [id number] Patient Account Number: [id number] Date of Birth/Sex: Treating RN: March 09, 1990 (31 y.o. 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 [name] Primary Care Provider: [name] Other Clinician: Referring Provider: Treating Provider/Extender: Kevin Fenton in Treatment: 4 Subjective Chief Complaint Information obtained from Patient Left foot wounds History of Present Illness (HPI) Admission 02/14/2021 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 While she was treating this wound she subsequently developed 3 additional wounds. These are located to the dorsal aspect and lateral aspect of the left foot and third toe. The wounds started out as blisters and she thinks these developed due to putting on the dressing too tightly around her foot. Currently she has been using Santyl to the wound beds however she has been leaving this to air dry throughout the day. She reports finishing doxycycline and currently denies signs of infection. 1/6; patient presents for follow-up. She has been using Santyl to the areas of eschar and Hydrofera Blue to the toe wound. She  reports improvement in wound healing. She has no issues or complaints today. She denies signs of infection. 1/23; patient presents for follow-up. She has been using Santyl to the wound beds without issues. She denies signs of infection. 1/30; patient presents for follow-up. She has been using Santyl to the lateral left foot wound and Santyl and Hydrofera Blue to the dorsal left foot wound. She denies signs of infection. She has no issues or complaints today. Patient History Information obtained from Patient. Family History Cancer - Maternal Grandparents,Mother, Diabetes - Mother, No family history of Heart Disease, Hereditary Spherocytosis,  Hypertension, Kidney Disease, Lung Disease, Seizures, Stroke, Thyroid Problems, Tuberculosis. Social History Never smoker, Marital Status - Single, Alcohol Use - Rarely, Drug Use - No History, Caffeine Use - Moderate. Medical History Hematologic/Lymphatic Patient has history of Anemia Endocrine Patient has history of Type I Diabetes - Since 2008 Neurologic Patient has history of Neuropathy Medical A Surgical History Notes nd Cardiovascular Tachycardia Gastrointestinal GERD Objective Constitutional respirations regular, non-labored and within target range for patient.. Vitals Time Taken: 3:07 PM, Height: 68 in, Source: Stated, Weight: 183 lbs, Source: Stated, BMI: 27.8, Temperature: 98.5 F, Pulse: 108 bpm, Respiratory Rate: 18 breaths/min, Blood Pressure: 93/58 mmHg, Capillary Blood Glucose: 174 mg/dl. General Notes: glucose per pt report at present per her meter Cardiovascular 2+ dorsalis pedis/posterior tibialis pulses. Psychiatric pleasant and cooperative. General Notes: Left foot: T the dorsal aspect there is an open wound with mostly hypergranulated tissue and some nonviable tissue present. T the lateral o o aspect there is mostly nonviable tissue throughout. No signs of surrounding infection. Integumentary (Hair, Skin) Wound #1 status is Open. Original cause of wound was Gradually Appeared. The date acquired was: 01/04/2021. The wound has been in treatment 4 weeks. The wound is located on the Left,Dorsal Foot. The wound measures 5.8cm length x 2.6cm width x 0.1cm depth; 11.844cm^2 area and 1.184cm^3 volume. There is Fat Layer (Subcutaneous Tissue) exposed. There is no tunneling or undermining noted. There is a medium amount of serosanguineous drainage noted. The wound margin is flat and intact. There is large (67-100%) red, pink, hyper - granulation within the wound bed. There is a small (1-33%) amount of necrotic tissue within the wound bed including Adherent Slough. Wound  #2 status is Open. Original cause of wound was Blister. The date acquired was: 01/04/2021. The wound has been in treatment 4 weeks. The wound is located on the Left,Lateral Foot. The wound measures 2.5cm length x 2.3cm width x 0.1cm depth; 4.516cm^2 area and 0.452cm^3 volume. There is Fat Layer (Subcutaneous Tissue) exposed. There is no tunneling or undermining noted. There is a medium amount of serosanguineous drainage noted. The wound margin is distinct with the outline attached to the wound base. There is small (1-33%) red granulation within the wound bed. There is a large (67-100%) amount of necrotic tissue within the wound bed including Adherent Slough. 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 I debrided nonviable tissue. I recommended continuing Santyl to the nonviable tissue and Hydrofera Blue to the granulation tissue. I also used silver nitrate to the hyper granulated areas. No signs of surrounding infection. Procedures Wound #1 Pre-procedure diagnosis of Wound #1 is a Diabetic Wound/Ulcer of the Lower Extremity located on  the Left,Dorsal Foot .Severity of Tissue Pre Debridement is: Fat layer exposed. There was a Excisional Skin/Subcutaneous Tissue Debridement with a total area of 4 sq cm performed by Geralyn Corwin, DO. With the following instrument(s): Blade, Curette, and Forceps to remove Viable and Non-Viable tissue/material. Material removed includes Subcutaneous Tissue and Slough and after achieving pain control using Other (benzocaine 20% spray). No specimens were taken. A time out was conducted at 15:20, prior to the start of the procedure. A Minimum  amount of bleeding was controlled with Silver Nitrate. The procedure was tolerated well with a pain level of 0 throughout and a pain level of 0 following the procedure. Post Debridement Measurements: 5.8cm length x 2.6cm width x 0.1cm depth; 1.184cm^3 volume. Character of Wound/Ulcer Post Debridement is improved. Severity of Tissue Post Debridement is: Fat layer exposed. Post procedure Diagnosis Wound #1: Same as Pre-Procedure Wound #2 Pre-procedure diagnosis of Wound #2 is a Diabetic Wound/Ulcer of the Lower Extremity located on the Left,Lateral Foot .Severity of Tissue Pre Debridement is: Fat layer exposed. There was a Excisional Skin/Subcutaneous Tissue Debridement with a total area of 5.75 sq cm performed by Geralyn Corwin, DO. With the following instrument(s): Blade, Curette, and Forceps to remove Viable and Non-Viable tissue/material. Material removed includes Subcutaneous Tissue and Slough and after achieving pain control using Other (benzocaine 20% spray). No specimens were taken. A time out was conducted at 15:20, prior to the start of the procedure. A Minimum amount of bleeding was controlled with Pressure. The procedure was tolerated well with a pain level of 0 throughout and a pain level of 0 following the procedure. Post Debridement Measurements: 2.5cm length x 2.3cm width x 0.1cm depth; 0.452cm^3 volume. Character of Wound/Ulcer Post Debridement is improved. Severity of Tissue Post Debridement is: Fat layer exposed. Post procedure Diagnosis Wound #2: Same as Pre-Procedure Plan Follow-up Appointments: Return Appointment in 1 week. - with Dr. Mikey Bussing Bathing/ Shower/ Hygiene: May shower with protection but do not get wound dressing(s) wet. Off-Loading: Other: - Open T Boot (given by Podiatry) oe Additional Orders / Instructions: Follow Nutritious Diet - Monitor/Control Blood Sugars to aide in wound healing. WOUND #1: - Foot Wound Laterality: Dorsal, Left Cleanser: Soap and Water  1 x Per Day/15 Days Discharge Instructions: May shower and wash wound with dial antibacterial soap and water prior to dressing change. Prim Dressing: Hydrofera Blue Ready Foam, 4x5 in (Generic) 1 x Per Day/15 Days ary Discharge Instructions: Apply over Santyl Prim Dressing: Santyl Ointment 1 x Per Day/15 Days ary Discharge Instructions: ***APPLY ONLY TO SLOUGH (YELLOW/BROWN) AREAS*** Secondary Dressing: Woven Gauze Sponge, Non-Sterile 4x4 in (Generic) 1 x Per Day/15 Days Discharge Instructions: Apply over primary dressing as directed. Secured With: [employer], 4.5x3.1 (in/yd) (Generic) 1 x Per Day/15 Days Discharge Instructions: Secure with Kerlix as directed. Secured With: 74M Medipore H Soft Cloth Surgical T ape, 4 x 10 (in/yd) (Generic) 1 x Per Day/15 Days Discharge Instructions: Secure with tape as directed. WOUND #2: - Foot Wound Laterality: Left, Lateral Cleanser: Soap and Water 1 x Per Day/30 Days Discharge Instructions: May shower and wash wound with dial antibacterial soap and water prior to dressing change. Prim Dressing: Santyl Ointment 1 x Per Day/30 Days ary Discharge Instructions: Apply nickel thick amount to wound bed as instructed Secondary Dressing: Woven Gauze Sponge, Non-Sterile 4x4 in (Generic) 1 x Per Day/30 Days Discharge Instructions: Apply over primary dressing as directed. Secured With: [employer], 4.5x3.1 (in/yd) (Generic) 1 x Per Day/30 Days Discharge  Instructions: Secure with Kerlix as directed. Secured With: 76M Medipore H Soft Cloth Surgical T ape, 4 x 10 (in/yd) (Generic) 1 x Per Day/30 Days Discharge Instructions: Secure with tape as directed. 1. In office sharp debridement 2. Silver nitrate 3. Santyl 4. Hydrofera Blue 5. Offloading 6. Follow-up in 1 week Electronic Signature(s) Signed: 03/19/2021 12:51:24 PM By: Geralyn CorwinHoffman, Argenis Kumari DO Entered By: Geralyn CorwinHoffman, Kharee Lesesne on 03/19/2021  12:50:59 -------------------------------------------------------------------------------- HxROS Details Patient Name: Date of Service: Kelli ChoJO [facility]18[facility] 3:00 PM Medical Record Number: [id number] Patient Account Number: [id number] Date of Birth/Sex: Treating RN: March 22, 1990 (31 y.o. Roel CluckF) [name], [name] Primary Care Provider: Herbie Drapeudd, Stephen Other Clinician: Referring Provider: Treating Provider/Extender: Kevin FentonHoffman, Jamirah Zelaya Rudd, Stephen Weeks in Treatment: 4 Information Obtained From Patient Hematologic/Lymphatic Medical History: Positive for: Anemia Cardiovascular Medical History: Past Medical History Notes: Tachycardia Gastrointestinal Medical History: Past Medical History Notes: GERD Endocrine Medical History: Positive for: Type I Diabetes - Since 2008 Treated with: Insulin Blood sugar tested every day: Yes Tested : Dexcom Neurologic Medical History: Positive for: Neuropathy Immunizations Pneumococcal Vaccine: Received Pneumococcal Vaccination: Yes Received Pneumococcal Vaccination On or After 60th Birthday: No Implantable Devices None Family and Social History Cancer: Yes - Maternal Grandparents,Mother; Diabetes: Yes - Mother; Heart Disease: No; Hereditary Spherocytosis: No; Hypertension: No; Kidney Disease: No; Lung Disease: No; Seizures: No; Stroke: No; Thyroid Problems: No; Tuberculosis: No; Never smoker; Marital Status - Single; Alcohol Use: Rarely; Drug Use: No History; Caffeine Use: Moderate; Financial Concerns: No; Food, Clothing or Shelter Needs: No; Support System Lacking: No; Transportation Concerns: No Electronic Signature(s) Signed: 03/19/2021 12:51:24 PM By: Geralyn CorwinHoffman, Aislin Onofre DO Signed: 03/19/2021 4:25:26 PM By: Antonieta IbaBarnhart, [name] Entered By: Geralyn CorwinHoffman, Aby Gessel on 03/19/2021 12:43:30 -------------------------------------------------------------------------------- SuperBill Details Patient Name: Date of Service: [facility]  [facility]18[facility]21 Medical Record Number: [id number] Patient Account Number: [id number] Date of Birth/Sex: Treating RN: March 22, 1990 (31 y.o. 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 Z89.422 Acquired absence of other left toe(s) I10 Essential (primary) hypertension Facility Procedures CPT4 Code: [id number] Description: 11042 - DEB SUBQ TISSUE 20 SQ CM/< ICD-10 Diagnosis Description L97.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

## 2021-03-25 ENCOUNTER — Other Ambulatory Visit: Payer: Self-pay

## 2021-03-25 ENCOUNTER — Encounter (HOSPITAL_BASED_OUTPATIENT_CLINIC_OR_DEPARTMENT_OTHER): Payer: 59 | Attending: Internal Medicine | Admitting: Internal Medicine

## 2021-03-25 DIAGNOSIS — I10 Essential (primary) hypertension: Secondary | ICD-10-CM | POA: Insufficient documentation

## 2021-03-25 DIAGNOSIS — E10621 Type 1 diabetes mellitus with foot ulcer: Secondary | ICD-10-CM | POA: Diagnosis not present

## 2021-03-25 DIAGNOSIS — Z89422 Acquired absence of other left toe(s): Secondary | ICD-10-CM | POA: Insufficient documentation

## 2021-03-25 DIAGNOSIS — L97528 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ty: Secondary | ICD-10-CM | POA: Diagnosis not present

## 2021-03-25 DIAGNOSIS — E1042 Type 1 diabetes mellitus with diabetic polyneuropathy: Secondary | ICD-10-CM | POA: Diagnosis not present

## 2021-03-25 NOTE — Progress Notes (Signed)
Kelli Hudson ([id number]) Visit Report for 03/25/2021 Chief Complaint Document Details Patient Name: Date of Service: Kelli Hudson [facility] 03/25/2021 3:00 PM Medical Record Number: [id number] Patient Account Number: [id number] Date of Birth/Sex: Treating RN: 08/31/1990 (31 y.o. Kelli Hudson Primary Care Provider: Arlester Marker Other Clinician: Referring Provider: Treating Provider/Extender: Fermin Schwab in Treatment: 5 Information Obtained from: Patient Chief Complaint Left foot wounds Electronic Signature(s) Signed: 03/25/2021 4:20:11 PM By: Kalman Shan DO Entered By: Kalman Shan on 03/25/2021 16:17:04 -------------------------------------------------------------------------------- Debridement Details Patient Name: Date of Service: Kelli Hudson [facility] 03/25/2021 3:00 PM Medical Record Number: [id number] Patient Account Number: [id number] Date of Birth/Sex: Treating RN: [birth year] (31 y.o. Kelli Hudson Primary Care Provider: Arlester Marker Other Clinician: Referring Provider: Treating Provider/Extender: Fermin Schwab in Treatment: 5 Debridement Performed for Assessment: Wound #1 Left,Dorsal Foot Performed By: Physician Kalman Shan, DO Debridement Type: Debridement Severity of Tissue Pre Debridement: Fat layer exposed Level of Consciousness (Pre-procedure): Awake and Alert Pre-procedure Verification/Time Out Yes - 15:30 Taken: Start Time: 15:31 Pain Control: Lidocaine 4% T opical Solution T Area Debrided (L x W): otal 3.6 (cm) x 1.8 (cm) = 6.48 (cm) Tissue and other material debrided: Viable, Non-Viable, Subcutaneous, Skin: Dermis , Skin: Epidermis, Fibrin/Exudate Level: Skin/Subcutaneous Tissue Debridement Description: Excisional Instrument: Curette Bleeding: Minimum Hemostasis Achieved: Pressure End Time: 15:37 Procedural Pain: 0 Post Procedural Pain: 0 Response to Treatment:  Procedure was tolerated well Level of Consciousness (Post- Awake and Alert procedure): Post Debridement Measurements of Total Wound Length: (cm) 3.6 Width: (cm) 1.8 Depth: (cm) 0.1 Volume: (cm) 0.509 Character of Wound/Ulcer Post Debridement: Improved Severity of Tissue Post Debridement: Fat layer exposed Post Procedure Diagnosis Same as Pre-procedure Electronic Signature(s) Signed: 03/25/2021 4:20:11 PM By: Kalman Shan DO Signed: 03/25/2021 4:47:34 PM By: Deon Pilling RN, BSN Entered By: Deon Pilling on 03/25/2021 15:37:37 -------------------------------------------------------------------------------- Debridement Details Patient Name: Date of Service: Kelli Hudson [facility] 03/25/2021 3:00 PM Medical Record Number: [id number] Patient Account Number: [id number] Date of Birth/Sex: Treating RN: 1990-08-18 (31 y.o. Kelli Hudson Primary Care Provider: Arlester Marker Other Clinician: Referring Provider: Treating Provider/Extender: Fermin Schwab in Treatment: 5 Debridement Performed for Assessment: Wound #2 Left,Lateral Foot Performed By: Physician Kalman Shan, DO Debridement Type: Debridement Severity of Tissue Pre Debridement: Fat layer exposed Level of Consciousness (Pre-procedure): Awake and Alert Pre-procedure Verification/Time Out Yes - 15:30 Taken: Start Time: 15:31 Pain Control: Lidocaine 4% T opical Solution T Area Debrided (L x W): otal 2.4 (cm) x 2.4 (cm) = 5.76 (cm) Tissue and other material debrided: Viable, Non-Viable, Subcutaneous, Skin: Dermis , Skin: Epidermis, Fibrin/Exudate Level: Skin/Subcutaneous Tissue Debridement Description: Excisional Instrument: Curette Bleeding: Minimum Hemostasis Achieved: Pressure End Time: 15:37 Procedural Pain: 0 Post Procedural Pain: 0 Response to Treatment: Procedure was tolerated well Level of Consciousness (Post- Awake and Alert procedure): Post Debridement Measurements of Total  Wound Length: (cm) 2.4 Width: (cm) 2.4 Depth: (cm) 0.2 Volume: (cm) 0.905 Character of Wound/Ulcer Post Debridement: Improved Severity of Tissue Post Debridement: Fat layer exposed Post Procedure Diagnosis Same as Pre-procedure Electronic Signature(s) Signed: 03/25/2021 4:20:11 PM By: Kalman Shan DO Signed: 03/25/2021 4:47:34 PM By: Deon Pilling RN, BSN Entered By: Deon Pilling on 03/25/2021 15:38:24 -------------------------------------------------------------------------------- HPI Details Patient Name: Date of Service: Kelli Hudson [facility] 03/25/2021 3:00 PM Medical Record Number: [id number] Patient Account Number: [id number] Date of Birth/Sex: Treating RN: November 06, 1990 (31 y.o. 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 While she was treating this wound she subsequently developed 3 additional wounds. These are located to the dorsal aspect and lateral aspect of the left foot and third toe. The wounds started out as blisters and she thinks these developed due to putting on the dressing too tightly around her foot. Currently she has been using Santyl to the wound beds however she has been leaving this to air dry throughout the day. She reports finishing doxycycline and currently denies signs of infection. 1/6; patient presents for follow-up. She has been using Santyl to the areas of eschar and Hydrofera Blue to the toe wound. She reports  improvement in wound healing. She has no issues or complaints today. She denies signs of infection. 1/23; patient presents for follow-up. She has been using Santyl to the wound beds without issues. She denies signs of infection. 1/30; patient presents for follow-up. She has been using Santyl to the lateral left foot wound and Santyl and Hydrofera Blue to the dorsal left foot wound. She denies signs of infection. She has no issues or complaints today. 2/6; patient presents for follow-up. She has no issues or complaints today. She denies signs of infection. Electronic Signature(s) Signed: 03/25/2021 4:20:11 PM By: Kalman Shan DO Entered By: Kalman Shan on 03/25/2021 16:17:30 -------------------------------------------------------------------------------- Physical Exam Details Patient Name: Date of Service: Kelli Hudson [facility] 03/25/2021 3:00 PM Medical Record Number: [id number] Patient Account Number: [id number] Date of Birth/Sex: Treating RN: 12/03/1990 (31 y.o. Kelli Hudson Primary Care Provider: Arlester Marker Other Clinician: Referring Provider: Treating Provider/Extender: Fermin Schwab in Treatment: 5 Constitutional respirations regular, non-labored and within target range for patient.. Cardiovascular 2+ dorsalis pedis/posterior tibialis pulses. Psychiatric pleasant and cooperative. Notes Left foot: T the dorsal aspect there is an open wound with mostly granulation tissue and some nonviable tissue present. T the lateral there is mostly o o granulation tissue now present with some nonviable tissue. No signs of surrounding infection. Electronic Signature(s) Signed: 03/25/2021 4:20:11 PM By: Kalman Shan DO Entered By: Kalman Shan on 03/25/2021 16:18:14 -------------------------------------------------------------------------------- Physician Orders Details Patient Name: Date of Service: Kelli Hudson [facility] 03/25/2021 3:00  PM Medical Record Number: [id number] Patient Account Number: [id number] Date of Birth/Sex: Treating RN: 1990-03-06 (31 y.o. 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 - with Dr. Heber Meyer Return A Bathing/ Shower/ Hygiene May shower with protection but do not get wound dressing(s) wet. Off-Loading Other: - Open T Boot (given by Podiatry) oe Additional Orders / Instructions Follow Nutritious Diet - Monitor/Control Blood Sugars to aide in wound healing. Wound  Treatment Wound #1 - Foot Wound Laterality: Dorsal, Left Cleanser: Soap and Water 1 x Per [id number] Days Discharge Instructions: May shower and wash wound with dial antibacterial soap and water prior to dressing change. Peri-Wound Care: Sween Lotion (Moisturizing lotion) 1 x Per Day/15 Days Discharge Instructions: Apply moisturizing lotion as directed Prim Dressing: Hydrofera Blue Ready Foam, 4x5 in (Generic) 1 x Per Day/15 Days ary Discharge Instructions: Apply over Santyl Prim Dressing: Santyl Ointment 1 x Per Day/15 Days ary Discharge Instructions: ***APPLY ONLY TO SLOUGH (YELLOW/BROWN) AREAS*** Secondary Dressing: Woven Gauze Sponge, Non-Sterile 4x4 in (Generic) 1 x Per Day/15 Days Discharge Instructions: Apply over primary dressing as directed. Secured With: [employer], 4.5x3.1 (in/yd) (Generic) 1 x Per Day/15 Days Discharge Instructions: Secure with Kerlix as directed. Secured With: 441M  Medipore H Soft Cloth Surgical T ape, 4 x 10 (in/yd) (Generic) 1 x Per Day/15 Days Discharge Instructions: Secure with tape as directed. Wound #2 - Foot Wound Laterality: Left, Lateral Cleanser: Soap and Water 1 x Per Day/30 Days Discharge Instructions: May shower and wash wound with dial antibacterial soap and water prior to dressing change. Peri-Wound Care: Sween Lotion (Moisturizing lotion) 1 x Per Day/30 Days Discharge Instructions: Apply moisturizing lotion as directed Prim Dressing: Hydrofera Blue Ready Foam, 4x5 in 1 x Per Day/30 Days ary Discharge Instructions: Apply to wound bed as instructed Prim Dressing: Santyl Ointment 1 x Per Day/30 Days ary Discharge Instructions: Apply nickel thick amount to wound bed as instructed Secondary Dressing: Woven Gauze Sponge, Non-Sterile 4x4 in (Generic) 1 x Per Day/30 Days Discharge Instructions: Apply over primary dressing as directed. Secured With: [employer], 4.5x3.1 (in/yd) (Generic) 1 x Per Day/30 Days Discharge Instructions: Secure with Kerlix as directed. Secured With: 441M Medipore H Soft Cloth Surgical T ape, 4 x 10 (in/yd) (Generic) 1 x Per Day/30 Days Discharge Instructions: Secure with tape as directed. Electronic Signature(s) Signed: 03/25/2021 4:20:11 PM By: Kalman Shan DO Entered By: Kalman Shan on 03/25/2021 16:18:47 -------------------------------------------------------------------------------- Problem List Details Patient Name: Date of Service: Kelli Hudson [facility] 03/25/2021 3:00 PM Medical Record Number: [id number] Patient Account Number: [id number] Date of Birth/Sex: Treating RN: 12/04/1990 (31 y.o. 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 02/14/2021 No Yes Z89.422 Acquired absence of other left toe(s) 02/14/2021 No Yes I10 Essential (primary) hypertension 02/14/2021 No Yes Inactive Problems Resolved Problems Electronic Signature(s) Signed: 03/25/2021 4:20:11 PM By: Kalman Shan DO Entered By: Kalman Shan on 03/25/2021 16:16:40 -------------------------------------------------------------------------------- Progress Note Details Patient Name: Date of Service: Kelli Hudson [facility] 03/25/2021 3:00 PM Medical Record Number: [id number] Patient Account Number: [id number] Date of Birth/Sex: Treating RN: April 27, 1990 (31 y.o. Kelli Hudson Primary Care Provider: Arlester Marker Other Clinician: Referring Provider: Treating Provider/Extender: Fermin Schwab in Treatment: 5 Subjective Chief Complaint Information obtained from Patient Left foot wounds History of Present Illness (HPI) Admission 02/14/2021 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 While she was treating this wound she subsequently developed 3 additional wounds. These are located to the dorsal aspect and lateral aspect of the left foot and third toe. The wounds started out as blisters and she thinks these developed due to putting on the dressing too tightly around her foot. Currently she has been using Santyl to the wound beds however she has been leaving this to air dry throughout the day. She reports finishing doxycycline and currently  denies signs of infection. 1/6; patient presents for follow-up. She has been using Santyl to the areas of eschar and Hydrofera Blue to the toe wound. She reports improvement in wound healing. She has no issues or complaints today. She denies signs of infection. 1/23; patient presents for follow-up. She has been using Santyl to the wound beds without issues. She denies signs of infection. 1/30; patient presents for follow-up. She has been using Santyl to the lateral left foot wound and Santyl and Hydrofera Blue to the dorsal left foot wound. She denies signs of infection. She has no issues or complaints today. 2/6; patient presents for follow-up. She has no issues or complaints today. She denies signs of infection. Patient History Information obtained from Patient. Family History Cancer - Maternal Grandparents,Mother, Diabetes - Mother, No family history of Heart Disease, Hereditary Spherocytosis, Hypertension, Kidney Disease, Lung Disease, Seizures, Stroke, Thyroid Problems, Tuberculosis. Social History Never smoker, Marital Status - Single, Alcohol Use - Rarely, Drug Use - No History, Caffeine Use - Moderate. Medical History Hematologic/Lymphatic Patient has history of Anemia Endocrine Patient has history of Type I Diabetes - Since 2008 Neurologic Patient has history of Neuropathy Medical A Surgical History Notes nd Cardiovascular Tachycardia Gastrointestinal GERD Objective Constitutional respirations regular, non-labored and within target range for patient.. Vitals Time Taken: 3:06 PM, Height: 68 in, Weight: 183 lbs, BMI: 27.8, Temperature: 98.5 F, Pulse: 84 bpm, Respiratory Rate: 16 breaths/min, Blood Pressure: 109/64 mmHg, Capillary Blood Glucose: 225 mg/dl. Cardiovascular 2+ dorsalis pedis/posterior tibialis pulses. Psychiatric pleasant and cooperative. General Notes: Left foot: T the dorsal aspect there is an open wound with mostly granulation tissue and some nonviable  tissue present. T the lateral there is o o mostly granulation tissue now present with some nonviable tissue. No signs of surrounding infection. Integumentary (Hair, Skin) Wound #1 status is Open. Original cause of wound was Gradually Appeared. The date acquired was: 01/04/2021. The wound has been in treatment 5 weeks. The wound is located on the Left,Dorsal Foot. The wound measures 3.6cm length x 1.8cm width x 0.1cm depth; 5.089cm^2 area and 0.509cm^3 volume. There is Fat Layer (Subcutaneous Tissue) exposed. There is no tunneling or undermining noted. There is a medium amount of serosanguineous drainage noted. The wound margin is flat and intact. There is large (67-100%) red, pink, hyper - granulation within the wound bed. There is a small (1-33%) amount of necrotic tissue within the wound bed including Adherent Slough. Wound #2 status is Open. Original cause of wound was Blister. The date acquired was: 01/04/2021. The wound has been in treatment 5 weeks. The wound is located on the Left,Lateral Foot. The wound measures 2.4cm length x 2.4cm width x 0.2cm depth; 4.524cm^2 area and 0.905cm^3 volume. There is Fat Layer (Subcutaneous Tissue) exposed. There is no tunneling or undermining noted. There is a medium amount of serosanguineous drainage noted. The wound margin is distinct with the outline attached to the wound base. There is  large (67-100%) red granulation within the wound bed. There is a small (1-33%) amount of necrotic tissue within the wound bed including Adherent Slough. 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 I debrided nonviable tissue. No signs of  surrounding infection. At this time I recommended doing Santyl and Hydrofera Blue to both wound beds. Follow-up in 1 week. Procedures Wound #1 Pre-procedure diagnosis of Wound #1 is a Diabetic Wound/Ulcer of the Lower Extremity located on the Left,Dorsal Foot .Severity of Tissue Pre Debridement is: Fat layer exposed. There was a Excisional Skin/Subcutaneous Tissue Debridement with a total area of 6.48 sq cm performed by Kalman Shan, DO. With the following instrument(s): Curette to remove Viable and Non-Viable tissue/material. Material removed includes Subcutaneous Tissue, Skin: Dermis, Skin: Epidermis, and Fibrin/Exudate after achieving pain control using Lidocaine 4% Topical Solution. A time out was conducted at 15:30, prior to the start of the procedure. A Minimum amount of bleeding was controlled with Pressure. The procedure was tolerated well with a pain level of 0 throughout and a pain level of 0 following the procedure. Post Debridement Measurements: 3.6cm length x 1.8cm width x 0.1cm depth; 0.509cm^3 volume. Character of Wound/Ulcer Post Debridement is improved. Severity of Tissue Post Debridement is: Fat layer exposed. Post procedure Diagnosis Wound #1: Same as Pre-Procedure Wound #2 Pre-procedure diagnosis of Wound #2 is a Diabetic Wound/Ulcer of the Lower Extremity located on the Left,Lateral Foot .Severity of Tissue Pre Debridement is: Fat layer exposed. There was a Excisional Skin/Subcutaneous Tissue Debridement with a total area of 5.76 sq cm performed by Kalman Shan, DO. With the following instrument(s): Curette to remove Viable and Non-Viable tissue/material. Material removed includes Subcutaneous Tissue, Skin: Dermis, Skin: Epidermis, and Fibrin/Exudate after achieving pain control using Lidocaine 4% Topical Solution. A time out was conducted at 15:30, prior to the start of the procedure. A Minimum amount of bleeding was controlled with Pressure. The procedure was tolerated  well with a pain level of 0 throughout and a pain level of 0 following the procedure. Post Debridement Measurements: 2.4cm length x 2.4cm width x 0.2cm depth; 0.905cm^3 volume. Character of Wound/Ulcer Post Debridement is improved. Severity of Tissue Post Debridement is: Fat layer exposed. Post procedure Diagnosis Wound #2: Same as Pre-Procedure Plan Follow-up Appointments: Return Appointment in 1 week. - with Dr. Heber [place] Bathing/ Shower/ Hygiene: May shower with protection but do not get wound dressing(s) wet. Off-Loading: Other: - Open T Boot (given by Podiatry) oe Additional Orders / Instructions: Follow Nutritious Diet - Monitor/Control Blood Sugars to aide in wound healing. WOUND #1: - Foot Wound Laterality: Dorsal, Left Cleanser: Soap and Water 1 x Per Day/15 Days Discharge Instructions: May shower and wash wound with dial antibacterial soap and water prior to dressing change. Peri-Wound Care: Sween Lotion (Moisturizing lotion) 1 x Per Day/15 Days Discharge Instructions: Apply moisturizing lotion as directed Prim Dressing: Hydrofera Blue Ready Foam, 4x5 in (Generic) 1 x Per Day/15 Days ary Discharge Instructions: Apply over Santyl Prim Dressing: Santyl Ointment 1 x Per Day/15 Days ary Discharge Instructions: ***APPLY ONLY TO SLOUGH (YELLOW/BROWN) AREAS*** Secondary Dressing: Woven Gauze Sponge, Non-Sterile 4x4 in (Generic) 1 x Per Day/15 Days Discharge Instructions: Apply over primary dressing as directed. Secured With: [employer], 4.5x3.1 (in/yd) (  Generic) 1 x Per Day/15 Days Discharge Instructions: Secure with Kerlix as directed. Secured With: 27M Medipore H Soft Cloth Surgical T ape, 4 x 10 (in/yd) (Generic) 1 x Per Day/15 Days Discharge Instructions: Secure with tape as directed. WOUND #2: - Foot Wound Laterality: Left, Lateral Cleanser: Soap and Water 1 x Per Day/30 Days Discharge Instructions: May shower and wash wound with dial antibacterial soap and water prior  to dressing change. Peri-Wound Care: Sween Lotion (Moisturizing lotion) 1 x Per Day/30 Days Discharge Instructions: Apply moisturizing lotion as directed Prim Dressing: Hydrofera Blue Ready Foam, 4x5 in 1 x Per Day/30 Days ary Discharge Instructions: Apply to wound bed as instructed Prim Dressing: Santyl Ointment 1 x Per Day/30 Days ary Discharge Instructions: Apply nickel thick amount to wound bed as instructed Secondary Dressing: Woven Gauze Sponge, Non-Sterile 4x4 in (Generic) 1 x Per Day/30 Days Discharge Instructions: Apply over primary dressing as directed. Secured With: [employer], 4.5x3.1 (in/yd) (Generic) 1 x Per Day/30 Days Discharge Instructions: Secure with Kerlix as directed. Secured With: 27M Medipore H Soft Cloth Surgical T ape, 4 x 10 (in/yd) (Generic) 1 x Per Day/30 Days Discharge Instructions: Secure with tape as directed. 1. In office sharp debridement 2. Santyl and Hydrofera Blue to the wound beds 3. Follow-up in 1 week Electronic Signature(s) Signed: 03/25/2021 4:20:11 PM By: Kalman Shan DO Entered By: Kalman Shan on 03/25/2021 16:19:38 -------------------------------------------------------------------------------- HxROS Details Patient Name: Date of Service: Kelli Hudson [facility] 03/25/2021 3:00 PM Medical Record Number: [id number] Patient Account Number: [id number] Date of Birth/Sex: Treating RN: Nov 27, 1990 (31 y.o. Kelli Hudson Primary Care Provider: Arlester Marker Other Clinician: Referring Provider: Treating Provider/Extender: Fermin Schwab in Treatment: 5 Information Obtained From Patient Hematologic/Lymphatic Medical History: Positive for: Anemia Cardiovascular Medical History: Past Medical History Notes: Tachycardia Gastrointestinal Medical History: Past Medical History Notes: GERD Endocrine Medical History: Positive for: Type I Diabetes - Since 2008 Treated with: Insulin Blood sugar tested  every day: Yes Tested : Dexcom Neurologic Medical History: Positive for: Neuropathy Immunizations Pneumococcal Vaccine: Received Pneumococcal Vaccination: Yes Received Pneumococcal Vaccination On or After 60th Birthday: No Implantable Devices None Family and Social History Cancer: Yes - Maternal Grandparents,Mother; Diabetes: Yes - Mother; Heart Disease: No; Hereditary Spherocytosis: No; Hypertension: No; Kidney Disease: No; Lung Disease: No; Seizures: No; Stroke: No; Thyroid Problems: No; Tuberculosis: No; Never smoker; Marital Status - Single; Alcohol Use: Rarely; Drug Use: No History; Caffeine Use: Moderate; Financial Concerns: No; Food, Clothing or Shelter Needs: No; Support System Lacking: No; Transportation Concerns: No Electronic Signature(s) Signed: 03/25/2021 4:20:11 PM By: Kalman Shan DO Signed: 03/25/2021 4:43:35 PM By: Lorrin Jackson Entered By: Kalman Shan on 03/25/2021 16:17:35 -------------------------------------------------------------------------------- SuperBill Details Patient Name: Date of Service: Kelli Hudson [facility] [facility] 03/25/2021 Medical Record Number: [id number] Patient Account Number: [id number] Date of Birth/Sex: Treating RN: July 21, 1990 (31 y.o. Kelli Hudson Primary Care Provider: Arlester Marker Other Clinician: Referring Provider: Treating Provider/Extender: Fermin Schwab in Treatment: 5 Diagnosis Coding ICD-10 Codes Code Description [phone] 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ty E10.621 Type 1 diabetes mellitus with foot ulcer E10.42 Type 1 diabetes mellitus with diabetic polyneuropathy Z89.422 Acquired absence of other left toe(s) I10 Essential (primary) hypertension Facility Procedures CPT4 Code: [id number] Description: [id number] - DEB SUBQ TISSUE 20 SQ CM/< ICD-10 Diagnosis Description L97.528 Non-pressure chronic ulcer of other part of left foot with other specified  seve Modifier: rity Quantity:  1 Physician Procedures : CPT4 Code Description Modifier [id number] - WC PHYS SUBQ TISS 20 SQ CM ICD-10 Diagnosis Description L97.528 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ty Quantity: 1 Electronic Signature(s) Signed: 03/25/2021 4:20:11 PM By: Kalman Shan DO Entered By: Kalman Shan on 03/25/2021 16:19:50

## 2021-03-26 ENCOUNTER — Ambulatory Visit: Payer: 59 | Admitting: Internal Medicine

## 2021-03-26 NOTE — Progress Notes (Deleted)
Name: Kelli Hudson  MRN/ DOB: [id number], 1990/11/10   Age/ Sex: 31 y.o., female    PCP: [name], MD   Reason for Endocrinology Evaluation: Type 1 Diabetes Mellitus     Date of Initial Endocrinology Visit: 12/18/2020    PATIENT IDENTIFIER: [name] is a 31 y.o. female with a past medical history of T1DM, and gastroparesis. The patient presented for initial endocrinology clinic visit on 12/18/2020 for consultative assistance with her diabetes management.     HPI: Kelli Hudson was    Diagnosed with DM 2008 ( age 37) .Hemoglobin A1c has ranged from 9.7% in 2017, peaking at 13.9% in 2021.   Started insulin pump 01/2021  Sees Dr. Jacqualyn Posey podiatry   ON Nexplanon    SUBJECTIVE:   During the last visit (12/18/2020): A1c 9.8% adjusted MDI regimen   Today (03/26/21): [name] is here for a follow up on diabetes management. She checks her  blood sugars *** times daily, preprandial to breakfast and ***. The patient has *** had hypoglycemic episodes since the last clinic visit, which typically occur *** x / - most often occuring ***. The patient is *** symptomatic with these episodes, with symptoms of {symptoms; hypoglycemia:[id number]}.   She presented to the ED 02/2021 with hyperglycemia  serum glucose 557 mg/dL and DKA     This patient with type 1 diabetes is treated with *** (insulin pump). During the visit the pump basal and bolus doses were reviewed including carb/insulin rations and supplemental doses. The clinical list was updated. The glucose meter download was reviewed in detail to determine if the current pump settings are providing the best glycemic control without excessive hypoglycemia.  Pump and meter download:    Pump   ****  Settings   Insulin type   ***   Basal rate       0000-0200  0.5 u/h           I:C ratio       0000-0600  1:12          Sensitivity       0000  50      Goal       0000  70-110              Type & Model of Pump: *** Insulin Type: Currently using ***.  There is no height or weight on file to calculate BMI.  PUMP STATISTICS: Average BG: *** +/- *** BG Readings: *** (*** / day) Average Daily Carbs (g): *** +/- *** Average Total Daily Insulin: *** +/- *** Average Daily Basal: *** (*** %) Average Daily Bolus: *** (*** %) Total Daily Dose in Units / KG: ***       HOME DIABETES REGIMEN: Lantus 16 units QHS  Novolin-R ( Vial) 1: 12 Correction Factor: Regular insulin (BG-130/50)     Statin: no ACE-I/ARB: yes Prior Diabetic Education: yes    CONTINUOUS GLUCOSE MONITORING RECORD INTERPRETATION    Dates of Recording: 10/19-11/02/2020  Sensor description:dexcom  Results statistics:   CGM use % of time 29  Average and SD 281/81  Time in range      14  %  % Time Above 180 20  % Time above 250 66  % Time Below target 0    Glycemic patterns summary: Hyperglycemia during the day and night   Hyperglycemic episodes  all day and night   Hypoglycemic episodes occurred following a bolus   Overnight periods: high  DIABETIC COMPLICATIONS: Microvascular complications:  Gastroparesis , neuropathy , has DR ( Received injections B/L).  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 pylori gastric ulcer    Diabetes mellitus ([place])    Diabetic gastroparesis ([place])    Gastroparesis    GERD (gastroesophageal reflux disease)    Hypertension    Hyperthyroidism    Past Surgical History:  Past Surgical History:  Procedure Laterality Date   AMPUTATION TOE Left 03/10/2018   Procedure: AMPUTATION FIFTH TOE;  Surgeon: Trula Slade, DPM;  Location: [place];  Service: Podiatry;  Laterality: Left;   BIOPSY  01/28/2020   Procedure: BIOPSY;  Surgeon: Otis Brace,  MD;  Location: [facility];  Service: Gastroenterology;;   ESOPHAGOGASTRODUODENOSCOPY N/A 01/28/2020   Procedure: ESOPHAGOGASTRODUODENOSCOPY (EGD);  Surgeon: Otis Brace, MD;  Location: [facility];  Service: Gastroenterology;  Laterality: N/A;   ESOPHAGOGASTRODUODENOSCOPY (EGD) WITH PROPOFOL Left 09/08/2015   Procedure: ESOPHAGOGASTRODUODENOSCOPY (EGD) WITH PROPOFOL;  Surgeon: Arta Silence, MD;  Location: [facility];  Service: Endoscopy;  Laterality: Left;   WISDOM TOOTH EXTRACTION      Social History:  reports that she has never smoked. She has never used smokeless tobacco. She reports current alcohol use of about 1.0 standard drink per week. She reports that she does not use drugs. Family History:  Family History  Problem Relation Age of Onset   Lung cancer Mother    Diabetes Mother    Bipolar disorder Father    Liver cancer Maternal Grandfather    Breast cancer Other        maternal great aunt   Heart disease Other    Ovarian cancer Other        maternal great aunt   Pancreatic cancer Maternal Uncle    Prostate cancer Maternal Uncle    Diabetes Maternal Aunt        x 2   Kidney disease Other        maternal great aunt     HOME MEDICATIONS: Allergies as of 03/26/2021   No Known Allergies      Medication List        Accurate as of March 26, 2021  7:31 AM. If you have any questions, ask your nurse or doctor.          albuterol 108 (90 Base) MCG/ACT inhaler Commonly known as: VENTOLIN HFA Inhale 2 puffs into the lungs every 6 (six) hours as needed for shortness of breath.   calcium gluconate 500 MG tablet Take 1 tablet (500 mg total) by mouth 3 (three) times daily.   Dexcom G6 Receiver Devi USE AS DIRECTED What changed:  how much to take how to take this when to take this   Dexcom G6 Sensor Misc 1 each by Does not apply route daily.   Dexcom G6 Transmitter Misc 1 each by Does not apply route daily.   famotidine 20 MG tablet Commonly known  as: PEPCID TAKE 1 TABLET(20 MG) BY MOUTH TWICE DAILY   hyoscyamine 0.375 MG 12 hr tablet Commonly known as: LEVBID Take 1 tablet (0.375 mg total) by mouth every 12 (twelve) hours as needed for cramping (abdominal pain).   insulin glargine 100 UNIT/ML injection Commonly known as: LANTUS Inject 0.18 mLs (18 Units total) into the skin daily. What changed: when to take this  Insulin Pen Needle 31G X 5 MM Misc 1 Device by Does not apply route QID. For use with insulin pens   insulin regular 100 units/mL injection Commonly known as: NovoLIN R Inject 0-0.15 mLs (0-15 Units total) into the skin 3 (three) times daily before meals.   lisinopril 10 MG tablet Commonly known as: ZESTRIL Take 1 tablet (10 mg total) by mouth daily.   metoCLOPramide 10 MG tablet Commonly known as: Reglan Take 1 tablet (10 mg total) by mouth 3 (three) times daily before meals.   pantoprazole 40 MG tablet Commonly known as: PROTONIX Take 40 mg by mouth daily.   promethazine 12.5 MG tablet Commonly known as: PHENERGAN Take 1 tablet (12.5 mg total) by mouth every 6 (six) hours as needed for nausea or vomiting.   Santyl ointment Generic drug: collagenase Apply 1 application topically daily.   True Metrix Blood Glucose Test test strip Generic drug: glucose blood Use as instructed What changed:  how much to take how to take this when to take this   True Metrix Meter w/Device Kit 1 Device by Does not apply route 4 (four) times daily.   TRUEplus Lancets 28G Misc assist with checking blood sugar TID and qhs What changed:  how much to take how to take this when to take this         ALLERGIES: No Known Allergies   REVIEW OF SYSTEMS: A comprehensive ROS was conducted with the patient and is negative except as per HPI     OBJECTIVE:   VITAL SIGNS: There were no vitals taken for this visit.   PHYSICAL EXAM:  General: Pt appears well and is in NAD  Neck: General: Supple without adenopathy  or carotid bruits. Thyroid: Thyroid size normal.  No goiter or nodules appreciated.  Lungs: Clear with good BS bilat with no rales, rhonchi, or wheezes  Heart: RRR with normal S1 and S2 and no gallops; no murmurs; no rub  Abdomen: Normoactive bowel sounds, soft, nontender, without masses or organomegaly palpable  Extremities:  Lower extremities - No pretibial edema. No lesions.  Neuro: MS is good with appropriate affect, pt is alert and Ox3    DATA REVIEWED:  Lab Results  Component Value Date   HGBA1C 9.9 (H) 02/25/2021   HGBA1C 9.8 (H) 11/12/2020   HGBA1C 10.5 (H) 09/09/2020   Lab Results  Component Value Date   MICROALBUR 48.4 (H) 05/22/2020   LDLCALC 132 (H) 05/22/2020   CREATININE 1.00 02/28/2021   Lab Results  Component Value Date   MICRALBCREAT 66.9 (H) 05/22/2020    Lab Results  Component Value Date   CHOL 231 (H) 05/22/2020   HDL 76.30 05/22/2020   LDLCALC 132 (H) 05/22/2020   LDLDIRECT 83.0 07/05/2019   TRIG 113.0 05/22/2020   CHOLHDL 3 05/22/2020        ASSESSMENT / PLAN / RECOMMENDATIONS:   1) Type 1 Diabetes Mellitus, Poorly controlled, With retinopathic, neuropathic complications and gastroparesis  - Most recent A1c of 9.8 %. Goal A1c < 7.0 %.     I have discussed with the patient the pathophysiology of diabetes. We went over the natural progression of the disease. We stressed the importance of lifestyle changes including diet and exercise. I explained the complications associated with diabetes including retinopathy, nephropathy, neuropathy as well as increased risk of cardiovascular disease. We went over the benefit seen with glycemic control.   I explained to the patient that diabetic patients are at higher than normal risk for  amputations.  Unfortunately she already has microvascular complications, we discussed it is still important to slow down any further damage She is on Novolin-R vials, I offered to switch to pens but she just picked up multiple and  would like to use first  She is interested in insulin pumps , she is changing insurance, and will order once the new insurance kicks in.  She meets with [name]  Due to gastroparesis she takes insulin post-prandial, it will be difficult to reach optimal glycemic control  Will adjust insulin as below   MEDICATIONS: - Lantus 16 units daily  - Novolin - R 1 units for every 12 grams of carbohydrates  Correction Factor: Regular insulin (BG-130/50)   EDUCATION / INSTRUCTIONS: BG monitoring instructions: Patient is instructed to check her blood sugars 3 times a day, before meals . Call [facility] if: BG persistently < 70  I reviewed the Rule of 15 for the treatment of hypoglycemia in detail with the patient. Literature supplied.   2) Diabetic complications:  Eye: Does  have known diabetic retinopathy.  Neuro/ Feet: Does  have known diabetic peripheral neuropathy. Renal: Patient does not have known baseline CKD. She is  on an ACEI/ARB at present   3) Lipids: LDL is acceptable. NO indication for statin therapy until age 36-40     F/U in 3 months    Signed electronically by: Mack Guise, MD  [facility]  [facility] [address] [place],  [address] Phone: [phone] FAX: [phone]   CC: [name], [address] [place] [address] Phone: [phone]  Fax: [phone]    Return to Endocrinology clinic as below: Future Appointments  Date Time Provider [place]  03/26/2021 10:10 AM Nickia Boesen, Melanie Crazier, MD [facility]  03/27/2021 11:00 AM Ocie Doyne, RN [place] [facility]  04/01/2021  2:00 PM Valinda Party, DO [facility]  04/10/2021  2:00 PM [facility]

## 2021-03-26 NOTE — Progress Notes (Signed)
Kelli Hudson, Kelli Hudson ([id number]) Visit Report for 03/25/2021 Arrival Information Details Patient Name: Date of Service: Kelli Hudson [facility] 03/25/2021 3:00 PM Medical Record Number: [id number] Patient Account Number: [id number] Date of Birth/Sex: Treating RN: October 16, 1990 (31 y.o. [name], Meta.Reding Primary Care [name]: Arlester Marker Other Clinician: Referring Darryll Raju: Treating Karey Stucki/Extender: Fermin Schwab in Treatment: 5 Visit Information History Since Last Visit Added or deleted any medications: No Patient Arrived: Ambulatory Any new allergies or adverse reactions: No Arrival Time: 15:05 Had a fall or experienced change in No Accompanied By: family member activities of daily living that may affect Transfer Assistance: None risk of falls: Patient Identification Verified: Yes Signs or symptoms of abuse/neglect since last visito No Secondary Verification Process Completed: Yes Hospitalized since last visit: No Patient Requires Transmission-Based Precautions: No Implantable device outside of the clinic excluding No Patient Has Alerts: No cellular tissue based products placed in the center since last visit: Has Dressing in Place as Prescribed: Yes Pain Present Now: No Electronic Signature(s) Signed: 03/25/2021 4:47:34 PM By: Deon Pilling RN, BSN Entered By: Deon Pilling on 03/25/2021 15:05:49 -------------------------------------------------------------------------------- Encounter Discharge Information Details Patient Name: Date of Service: Kelli Hudson [facility] 03/25/2021 3:00 PM Medical Record Number: [id number] Patient Account Number: [id number] Date of Birth/Sex: Treating RN: [birth year] (31 y.o. [name], [name] Primary Care Jaquarius Seder: Arlester Marker Other Clinician: Referring Decarlo Rivet: Treating Abdoul Encinas/Extender: Fermin Schwab in Treatment: 5 Encounter Discharge Information Items Post Procedure Vitals Discharge  Condition: Stable Temperature (F): 98.5 Ambulatory Status: Ambulatory Pulse (bpm): 84 Discharge Destination: Home Respiratory Rate (breaths/min): 20 Transportation: Private Auto Blood Pressure (mmHg): 109/64 Accompanied By: family member Schedule Follow-up Appointment: Yes Clinical Summary of Care: Electronic Signature(s) Signed: 03/25/2021 4:47:34 PM By: Deon Pilling RN, BSN Entered By: Deon Pilling on 03/25/2021 15:39:33 -------------------------------------------------------------------------------- Lower Extremity Assessment Details Patient Name: Date of Service: Kelli Hudson [facility] 03/25/2021 3:00 PM Medical Record Number: [id number] Patient Account Number: [id number] Date of Birth/Sex: Treating RN: February 08, 1991 (31 y.o. Kelli Hudson Primary Care Josafat Enrico: Arlester Marker Other Clinician: Referring Delwyn Scoggin: Treating Everardo Voris/Extender: Fermin Schwab in Treatment: 5 Edema Assessment Assessed: [name]: Yes] [Right: No] Edema: [Left: N] [Right: o] Calf Left: Right: Point of Measurement: 31 cm From Medial Instep 37 cm Ankle Left: Right: Point of Measurement: 10 cm From Medial Instep 23 cm Vascular Assessment Pulses: Dorsalis Pedis Palpable: [Left:Yes] Electronic Signature(s) Signed: 03/25/2021 4:47:34 PM By: Deon Pilling RN, BSN Entered By: Deon Pilling on 03/25/2021 15:07:21 -------------------------------------------------------------------------------- Multi Wound Chart Details Patient Name: Date of Service: Kelli Hudson [facility] 03/25/2021 3:00 PM Medical Record Number: [id number] Patient Account Number: [id number] Date of Birth/Sex: Treating RN: 11/13/1990 (31 y.o. [name] Primary Care Dachelle Molzahn: Arlester Marker Other Clinician: Referring Tequita Marrs: Treating Keylen Eckenrode/Extender: Fermin Schwab in Treatment: 5 Vital Signs Height(in): 28 Capillary Blood Glucose(mg/dl): 225 Weight(lbs):  183 Pulse(bpm): 26 Body Mass Index(BMI): 27.8 Blood Pressure(mmHg): 109/64 Temperature(F): 98.5 Respiratory Rate(breaths/min): 16 Photos: [1:Left, Dorsal Foot] [2:Left, Lateral Foot] [N/A:N/A N/A] Wound Location: [1:Gradually Appeared] [2:Blister] [N/A:N/A] Wounding Event: [1:Diabetic Wound/Ulcer of the Lower] [2:Diabetic Wound/Ulcer of the Lower] [N/A:N/A] Primary Etiology: [1:Extremity Anemia, Type I Diabetes, Neuropathy Anemia, Type I Diabetes, Neuropathy N/A] [2:Extremity] Comorbid History: [1:01/04/2021] [2:01/04/2021] [N/A:N/A] Date Acquired: [1:5] [2:5] [N/A:N/A] Weeks of Treatment: [1:Open] [2:Open] [N/A:N/A] Wound Status: [1:No] [2:No] [N/A:N/A] Wound Recurrence: [1:3.6x1.8x0.1] [2:2.4x2.4x0.2] [N/A:N/A] Measurements L x W x D (cm) [1:5.089] [2:4.524] [N/A:N/A] A (cm) :  rea [1:0.509] [2:0.905] [N/A:N/A] Volume (cm) : [1:80.30%] [2:55.00%] [N/A:N/A] % Reduction in A [1:rea: 80.30%] [2:10.00%] [N/A:N/A] % Reduction in Volume: [1:Grade 1] [2:Grade 1] [N/A:N/A] Classification: [1:Medium] [2:Medium] [N/A:N/A] Exudate A mount: [1:Serosanguineous] [2:Serosanguineous] [N/A:N/A] Exudate Type: [1:red, brown] [2:red, brown] [N/A:N/A] Exudate Color: [1:Flat and Intact] [2:Distinct, outline attached] [N/A:N/A] Wound Margin: [1:Large (67-100%)] [2:Large (67-100%)] [N/A:N/A] Granulation A mount: [1:Red, Pink, Hyper-granulation] [2:Red] [N/A:N/A] Granulation Quality: [1:Small (1-33%)] [2:Small (1-33%)] [N/A:N/A] Necrotic A mount: [1:Fat Layer (Subcutaneous Tissue): Yes Fat Layer (Subcutaneous Tissue): Yes N/A] Exposed Structures: [1:Fascia: No Tendon: No Muscle: No Joint: No Bone: No Medium (34-66%)] [2:Fascia: No Tendon: No Muscle: No Joint: No Bone: No Small (1-33%)] [N/A:N/A] Epithelialization: [1:Debridement - Excisional] [2:Debridement - Excisional] [N/A:N/A] Debridement: Pre-procedure Verification/Time Out 15:30 [2:15:30] [N/A:N/A] Taken: [1:Lidocaine 4% Topical Solution]  [2:Lidocaine 4% Topical Solution] [N/A:N/A] Pain Control: [1:Subcutaneous] [2:Subcutaneous] [N/A:N/A] Tissue Debrided: [1:Skin/Subcutaneous Tissue] [2:Skin/Subcutaneous Tissue] [N/A:N/A] Level: [1:6.48] [2:5.76] [N/A:N/A] Debridement A (sq cm): [1:rea Curette] [2:Curette] [N/A:N/A] Instrument: [1:Minimum] [2:Minimum] [N/A:N/A] Bleeding: [1:Pressure] [2:Pressure] [N/A:N/A] Hemostasis A chieved: [1:0] [2:0] [N/A:N/A] Procedural Pain: [1:0] [2:0] [N/A:N/A] Post Procedural Pain: [1:Procedure was tolerated well] [2:Procedure was tolerated well] [N/A:N/A] Debridement Treatment Response: [1:3.6x1.8x0.1] [2:2.4x2.4x0.2] [N/A:N/A] Post Debridement Measurements L x W x D (cm) [1:0.509] [2:0.905] [N/A:N/A] Post Debridement Volume: (cm) [1:Debridement] [2:Debridement] [N/A:N/A] Treatment Notes Wound #1 (Foot) Wound Laterality: Dorsal, Left Cleanser Soap and Water Discharge Instruction: May shower and wash wound with dial antibacterial soap and water prior to dressing change. Peri-Wound Care Sween Lotion (Moisturizing lotion) Discharge Instruction: Apply moisturizing lotion as directed Topical Primary Dressing Hydrofera Blue Ready Foam, 4x5 in Discharge Instruction: Apply over Santyl Santyl Ointment Discharge Instruction: ***APPLY ONLY TO SLOUGH (YELLOW/BROWN) AREAS*** Secondary Dressing Woven Gauze Sponge, Non-Sterile 4x4 in Discharge Instruction: Apply over primary dressing as directed. Secured With [employer], 4.5x3.1 (in/yd) Discharge Instruction: Secure with Kerlix as directed. 44M Medipore H Soft Cloth Surgical T ape, 4 x 10 (in/yd) Discharge Instruction: Secure with tape as directed. Compression Wrap Compression Stockings Add-Ons Wound #2 (Foot) Wound Laterality: Left, Lateral Cleanser Soap and Water Discharge Instruction: May shower and wash wound with dial antibacterial soap and water prior to dressing change. Peri-Wound Care Sween Lotion (Moisturizing  lotion) Discharge Instruction: Apply moisturizing lotion as directed Topical Primary Dressing Hydrofera Blue Ready Foam, 4x5 in Discharge Instruction: Apply to wound bed as instructed Santyl Ointment Discharge Instruction: Apply nickel thick amount to wound bed as instructed Secondary Dressing Woven Gauze Sponge, Non-Sterile 4x4 in Discharge Instruction: Apply over primary dressing as directed. Secured With [employer], 4.5x3.1 (in/yd) Discharge Instruction: Secure with Kerlix as directed. 44M Medipore H Soft Cloth Surgical T ape, 4 x 10 (in/yd) Discharge Instruction: Secure with tape as directed. Compression Wrap Compression Stockings Add-Ons Electronic Signature(s) Signed: 03/25/2021 4:20:11 PM By: Kalman Shan DO Signed: 03/25/2021 4:43:35 PM By: Fara Chute By: Kalman Shan on 03/25/2021 16:16:55 -------------------------------------------------------------------------------- Multi-Disciplinary Care Plan Details Patient Name: Date of Service: Kelli Hudson [facility] 03/25/2021 3:00 PM Medical Record Number: [id number] Patient Account Number: [id number] Date of Birth/Sex: Treating RN: 06-Sep-1990 (31 y.o. Kelli Hudson Primary Care Lantz Hermann: Arlester Marker Other Clinician: Referring Normon Pettijohn: Treating Kenon Delashmit/Extender: Fermin Schwab in Treatment: 5 Active Inactive Nutrition Nursing Diagnoses: Impaired glucose control: actual or potential Goals: Patient/caregiver verbalizes understanding of need to maintain therapeutic glucose control per primary care physician Date Initiated: 02/14/2021 Target Resolution Date: 04/19/2021 Goal Status: Active Interventions: Assess HgA1c results as ordered upon admission and as needed Provide education  on elevated blood sugars and impact on wound healing Notes: Wound/Skin Impairment Nursing Diagnoses: Impaired tissue integrity Goals: Patient/caregiver will verbalize understanding of  skin care regimen Date Initiated: 02/14/2021 Target Resolution Date: 04/19/2021 Goal Status: Active Ulcer/skin breakdown will have a volume reduction of 30% by week 4 Date Initiated: 02/14/2021 Date Inactivated: 03/25/2021 Target Resolution Date: 03/25/2021 Goal Status: Met Interventions: Assess patient/caregiver ability to obtain necessary supplies Assess patient/caregiver ability to perform ulcer/skin care regimen upon admission and as needed Assess ulceration(s) every visit Provide education on ulcer and skin care Treatment Activities: Topical wound management initiated : 02/14/2021 Notes: Electronic Signature(s) Signed: 03/25/2021 4:47:34 PM By: Deon Pilling RN, BSN Entered By: Deon Pilling on 03/25/2021 15:10:34 -------------------------------------------------------------------------------- Pain Assessment Details Patient Name: Date of Service: Kelli Hudson [facility] 03/25/2021 3:00 PM Medical Record Number: [id number] Patient Account Number: [id number] Date of Birth/Sex: Treating RN: 1990-08-06 (31 y.o. Kelli Hudson Primary Care Markella Dao: Arlester Marker Other Clinician: Referring Emmanuela Ghazi: Treating Krisha Beegle/Extender: Fermin Schwab in Treatment: 5 Active Problems Location of Pain Severity and Description of Pain Patient Has Paino No Site Locations Rate the pain. Current Pain Level: 0 Pain Management and Medication Current Pain Management: Medication: No Cold Application: No Rest: No Massage: No Activity: No T.E.N.S.: No Heat Application: No Leg drop or elevation: No Is the Current Pain Management Adequate: Adequate How does your wound impact your activities of daily livingo Sleep: No Bathing: No Appetite: No Relationship With Others: No Bladder Continence: No Emotions: No Bowel Continence: No Work: No Toileting: No Drive: No Dressing: No Hobbies: No Electronic Signature(s) Signed: 03/25/2021 4:47:34 PM By: Deon Pilling RN,  BSN Entered By: Deon Pilling on 03/25/2021 15:06:17 -------------------------------------------------------------------------------- Patient/Caregiver Education Details Patient Name: Date of Service: Kelli Hudson Kelli Hudson 2/6/2023andnbsp3:00 PM Medical Record Number: [id number] Patient Account Number: [id number] Date of Birth/Gender: Treating RN: Oct 03, 1990 (31 y.o. Kelli Hudson Primary Care Physician: Arlester Marker Other Clinician: Referring Physician: Treating Physician/Extender: Fermin Schwab in Treatment: 5 Education Assessment Education Provided To: Patient Education Topics Provided Elevated Blood Sugar/ Impact on Healing: Handouts: Elevated Blood Sugars: How Do They Affect Wound Healing Methods: Explain/Verbal Responses: Reinforcements needed Electronic Signature(s) Signed: 03/25/2021 4:47:34 PM By: Deon Pilling RN, BSN Entered By: Deon Pilling on 03/25/2021 15:11:18 -------------------------------------------------------------------------------- Wound Assessment Details Patient Name: Date of Service: Kelli Hudson [facility] 03/25/2021 3:00 PM Medical Record Number: [id number] Patient Account Number: [id number] Date of Birth/Sex: Treating RN: April 08, 1990 (31 y.o. [name], Meta.Reding Primary Care Mailen Newborn: Arlester Marker Other Clinician: Referring Rickard Kennerly: Treating Keyston Ardolino/Extender: Fermin Schwab in Treatment: 5 Wound Status Wound Number: 1 Primary Etiology: Diabetic Wound/Ulcer of the Lower Extremity Wound Location: Left, Dorsal Foot Wound Status: Open Wounding Event: Gradually Appeared Comorbid History: Anemia, Type I Diabetes, Neuropathy Date Acquired: 01/04/2021 Weeks Of Treatment: 5 Clustered Wound: No Photos Wound Measurements Length: (cm) 3.6 Width: (cm) 1.8 Depth: (cm) 0.1 Area: (cm) 5.089 Volume: (cm) 0.509 % Reduction in Area: 80.3% % Reduction in Volume: 80.3% Epithelialization: Medium  (34-66%) Tunneling: No Undermining: No Wound Description Classification: Grade 1 Wound Margin: Flat and Intact Exudate Amount: Medium Exudate Type: Serosanguineous Exudate Color: red, brown Foul Odor After Cleansing: No Slough/Fibrino Yes Wound Bed Granulation Amount: Large (67-100%) Exposed Structure Granulation Quality: Red, Pink, Hyper-granulation Fascia Exposed: No Necrotic Amount: Small (1-33%) Fat Layer (Subcutaneous Tissue) Exposed: Yes Necrotic Quality: Adherent Slough Tendon Exposed: No Muscle Exposed: No Joint Exposed: No Bone Exposed:  No Treatment Notes Wound #1 (Foot) Wound Laterality: Dorsal, Left Cleanser Soap and Water Discharge Instruction: May shower and wash wound with dial antibacterial soap and water prior to dressing change. Peri-Wound Care Sween Lotion (Moisturizing lotion) Discharge Instruction: Apply moisturizing lotion as directed Topical Primary Dressing Hydrofera Blue Ready Foam, 4x5 in Discharge Instruction: Apply over Santyl Santyl Ointment Discharge Instruction: ***APPLY ONLY TO SLOUGH (YELLOW/BROWN) AREAS*** Secondary Dressing Woven Gauze Sponge, Non-Sterile 4x4 in Discharge Instruction: Apply over primary dressing as directed. Secured With [employer], 4.5x3.1 (in/yd) Discharge Instruction: Secure with Kerlix as directed. 51M Medipore H Soft Cloth Surgical T ape, 4 x 10 (in/yd) Discharge Instruction: Secure with tape as directed. Compression Wrap Compression Stockings Add-Ons Electronic Signature(s) Signed: 03/25/2021 4:47:34 PM By: Deon Pilling RN, BSN Signed: 03/26/2021 8:57:49 AM By: Sandre Kitty Entered By: Sandre Kitty on 03/25/2021 15:10:21 -------------------------------------------------------------------------------- Wound Assessment Details Patient Name: Date of Service: Kelli Hudson [facility] 03/25/2021 3:00 PM Medical Record Number: [id number] Patient Account Number: [id number] Date of  Birth/Sex: Treating RN: 1990/08/03 (31 y.o. [name], Meta.Reding Primary Care Lalisa Kiehn: Arlester Marker Other Clinician: Referring Daaiyah Baumert: Treating Kiandre Spagnolo/Extender: Fermin Schwab in Treatment: 5 Wound Status Wound Number: 2 Primary Etiology: Diabetic Wound/Ulcer of the Lower Extremity Wound Location: Left, Lateral Foot Wound Status: Open Wounding Event: Blister Comorbid History: Anemia, Type I Diabetes, Neuropathy Date Acquired: 01/04/2021 Weeks Of Treatment: 5 Clustered Wound: No Photos Wound Measurements Length: (cm) 2.4 Width: (cm) 2.4 Depth: (cm) 0.2 Area: (cm) 4.524 Volume: (cm) 0.905 % Reduction in Area: 55% % Reduction in Volume: 10% Epithelialization: Small (1-33%) Tunneling: No Undermining: No Wound Description Classification: Grade 1 Wound Margin: Distinct, outline attached Exudate Amount: Medium Exudate Type: Serosanguineous Exudate Color: red, brown Foul Odor After Cleansing: No Slough/Fibrino Yes Wound Bed Granulation Amount: Large (67-100%) Exposed Structure Granulation Quality: Red Fascia Exposed: No Necrotic Amount: Small (1-33%) Fat Layer (Subcutaneous Tissue) Exposed: Yes Necrotic Quality: Adherent Slough Tendon Exposed: No Muscle Exposed: No Joint Exposed: No Bone Exposed: No Treatment Notes Wound #2 (Foot) Wound Laterality: Left, Lateral Cleanser Soap and Water Discharge Instruction: May shower and wash wound with dial antibacterial soap and water prior to dressing change. Peri-Wound Care Sween Lotion (Moisturizing lotion) Discharge Instruction: Apply moisturizing lotion as directed Topical Primary Dressing Hydrofera Blue Ready Foam, 4x5 in Discharge Instruction: Apply to wound bed as instructed Santyl Ointment Discharge Instruction: Apply nickel thick amount to wound bed as instructed Secondary Dressing Woven Gauze Sponge, Non-Sterile 4x4 in Discharge Instruction: Apply over primary dressing as  directed. Secured With [employer], 4.5x3.1 (in/yd) Discharge Instruction: Secure with Kerlix as directed. 51M Medipore H Soft Cloth Surgical T ape, 4 x 10 (in/yd) Discharge Instruction: Secure with tape as directed. Compression Wrap Compression Stockings Add-Ons Electronic Signature(s) Signed: 03/25/2021 4:47:34 PM By: Deon Pilling RN, BSN Signed: 03/26/2021 8:57:49 AM By: Sandre Kitty Entered By: Sandre Kitty on 03/25/2021 15:11:09 -------------------------------------------------------------------------------- Vitals Details Patient Name: Date of Service: Kelli Hudson [facility] 03/25/2021 3:00 PM Medical Record Number: [id number] Patient Account Number: [id number] Date of Birth/Sex: Treating RN: 01/30/1991 (30 y.o. [name], [name] Primary Care Chalese Peach: Arlester Marker Other Clinician: Referring Kiyah Demartini: Treating Nica Friske/Extender: Fermin Schwab in Treatment: 5 Vital Signs Time Taken: 15:06 Temperature (F): 98.5 Height (in): 68 Pulse (bpm): 84 Weight (lbs): 183 Respiratory Rate (breaths/min): 16 Body Mass Index (BMI): 27.8 Blood Pressure (mmHg): 109/64 Capillary Blood Glucose (mg/dl): 225 Reference Range: 80 - 120 mg / dl  Electronic Signature(s) Signed: 03/25/2021 4:47:34 PM By: Deon Pilling RN, BSN Entered By: Deon Pilling on 03/25/2021 15:06:08

## 2021-03-27 ENCOUNTER — Other Ambulatory Visit: Payer: Self-pay

## 2021-03-27 ENCOUNTER — Encounter: Payer: 59 | Attending: Internal Medicine | Admitting: Nutrition

## 2021-03-27 DIAGNOSIS — K3184 Gastroparesis: Secondary | ICD-10-CM | POA: Insufficient documentation

## 2021-03-27 DIAGNOSIS — E1043 Type 1 diabetes mellitus with diabetic autonomic (poly)neuropathy: Secondary | ICD-10-CM | POA: Insufficient documentation

## 2021-03-28 ENCOUNTER — Telehealth: Payer: Self-pay | Admitting: Nutrition

## 2021-03-28 NOTE — Telephone Encounter (Signed)
Patient reported no difficulties with using the pump: bolusing, sleeping, or responding to alerts or alarms..  Says was woken up at 3:30 AM with a low blood sugar of 69.  Says basal rate was stopped and blood sugars  did not go lower than this.  She went back to sleep and blood sugar was 186 when she woke up 3 hours later.

## 2021-03-29 NOTE — Patient Instructions (Signed)
Read over handouts given, and call if questions Read over manual.  Call Tandem help line if questions. Call office if blood sugars remain over 200, or drop below 70.

## 2021-03-29 NOTE — Progress Notes (Signed)
Kelli Hudson was trained on how to use the Tandem insulin pump.  We discussed how this pump delivers the insulin and she put in the settings per dr. Harvel Ricks orders:  Basal rate:0.5 u/hr, ISF: 50, I/C: 12, timing 5 hours, target: 110.  Control IQ was started and linked to her T-Connect app on her phone and to [place] [facility].  She was show how to do a meal and correction bolus, and how to fill and change cartridges. She was also shown how to start/stop pump, change pump settings, and respond to alerts and alarms, high blood sugar protocols, sick day guidelines, and things ot carry with her at all times..  Handouts given to her for all of the above.  She was encouraged to review these and to read the manual.  She agreed to do this and signed the checklist as understanding all topics with no final questions.

## 2021-04-01 ENCOUNTER — Encounter (HOSPITAL_BASED_OUTPATIENT_CLINIC_OR_DEPARTMENT_OTHER): Payer: 59 | Admitting: Internal Medicine

## 2021-04-01 ENCOUNTER — Other Ambulatory Visit: Payer: Self-pay

## 2021-04-01 DIAGNOSIS — E10621 Type 1 diabetes mellitus with foot ulcer: Secondary | ICD-10-CM | POA: Diagnosis not present

## 2021-04-01 DIAGNOSIS — L97528 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ty: Secondary | ICD-10-CM | POA: Diagnosis not present

## 2021-04-01 MED ORDER — FAMOTIDINE 20 MG PO TABS: ORAL_TABLET | ORAL | 0 refills | Status: DC

## 2021-04-01 NOTE — Progress Notes (Signed)
GIRLIE, VELTRI ([id number]) Visit Report for 04/01/2021 Chief Complaint Document Details Patient Name: Date of Service: Kelli Hudson [facility] 04/01/2021 2:00 PM Medical Record Number: [id number] Patient Account Number: [id number] Date of Birth/Sex: Treating RN: 09/23/1990 (31 y.o. [name] Primary Care Provider: [name] Other Clinician: Referring Provider: Treating Provider/Extender: Kevin Fenton in Treatment: 6 Information Obtained from: Patient Chief Complaint Left foot wounds Electronic Signature(s) Signed: 04/01/2021 3:20:15 PM By: Geralyn Corwin DO Entered By: Geralyn Corwin on 04/01/2021 15:16:54 -------------------------------------------------------------------------------- Debridement Details Patient Name: Date of Service: Kelli Hudson [facility] 04/01/2021 2:00 PM Medical Record Number: [id number] Patient Account Number: [id number] Date of Birth/Sex: Treating RN: 09/05/1990 (31 y.o. [name] Primary Care Provider: [name] Other Clinician: Referring Provider: Treating Provider/Extender: Kevin Fenton in Treatment: 6 Debridement Performed for Assessment: Wound #2 Left,Lateral Foot Performed By: Physician Geralyn Corwin, DO Debridement Type: Debridement Severity of Tissue Pre Debridement: Fat layer exposed Level of Consciousness (Pre-procedure): Awake and Alert Pre-procedure Verification/Time Out Yes - 14:19 Taken: Start Time: 14:20 Pain Control: Other : Benzocaine T Area Debrided (L x W): otal 2.4 (cm) x 2 (cm) = 4.8 (cm) Tissue and other material debrided: Non-Viable, Slough, Subcutaneous, Slough Level: Skin/Subcutaneous Tissue Debridement Description: Excisional Instrument: Curette Bleeding: Minimum Hemostasis Achieved: Pressure End Time: 14:26 Response to Treatment: Procedure was tolerated well Level of Consciousness (Post- Awake and Alert procedure): Post  Debridement Measurements of Total Wound Length: (cm) 2.4 Width: (cm) 2 Depth: (cm) 0.2 Volume: (cm) 0.754 Character of Wound/Ulcer Post Debridement: Stable Severity of Tissue Post Debridement: Fat layer exposed Post Procedure Diagnosis Same as Pre-procedure Electronic Signature(s) Signed: 04/01/2021 3:20:15 PM By: Geralyn Corwin DO Signed: 04/01/2021 5:31:57 PM By: Antonieta Iba Entered By: Antonieta Iba on 04/01/2021 14:27:07 -------------------------------------------------------------------------------- HPI Details Patient Name: Date of Service: Kelli Hudson [facility] 04/01/2021 2:00 PM Medical Record Number: [id number] Patient Account Number: [id number] Date of Birth/Sex: Treating RN: [birth year] (31 y.o. [name] Primary Care Provider: [name] Other Clinician: Referring Provider: Treating Provider/Extender: Kevin Fenton in Treatment: 6 History of Present Illness HPI Description: Admission 02/14/2021 Ms. 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 While she was treating this wound she subsequently developed 3 additional wounds. These are located to the dorsal aspect and lateral aspect of the left foot and third toe. The wounds started out as blisters and she thinks these developed due to putting on the dressing too tightly around her foot. Currently she has been using Santyl to the wound beds however she has been leaving this to air dry throughout the day. She reports finishing doxycycline and currently denies signs of infection. 1/6; patient presents for follow-up. She has been using Santyl to the areas of eschar and Hydrofera Blue to the toe  wound. She reports improvement in wound healing. She has no issues or complaints today. She denies signs of infection. 1/23; patient presents for follow-up. She has been using Santyl to the wound beds without issues. She denies signs of infection. 1/30; patient presents for follow-up. She has been using Santyl to the lateral left foot wound and Santyl and Hydrofera Blue to the dorsal left  foot wound. She denies signs of infection. She has no issues or complaints today. 2/6; patient presents for follow-up. She has no issues or complaints today. She denies signs of infection. 2/13; patient presents for follow-up. She has no issues or complaints today. She denies signs of infection. Electronic Signature(s) Signed: 04/01/2021 3:20:15 PM By: Geralyn CorwinHoffman, Nevena Rozenberg DO Entered By: Geralyn CorwinHoffman, Orena Cavazos on 04/01/2021 15:17:12 -------------------------------------------------------------------------------- Physical Exam Details Patient Name: Date of Service: [facility]00 PM Medical Record Number: [id number] Patient Account Number: [id number] Date of Birth/Sex: Treating RN: 10-May-1990 (31 y.o. Roel CluckF) [name], [name] Primary Care Provider: Herbie Drapeudd, Stephen Other Clinician: Referring Provider: Treating Provider/Extender: Kevin FentonHoffman, Aston Lieske Rudd, Stephen Weeks in Treatment: 6 Constitutional respirations regular, non-labored and within target range for patient.. Cardiovascular 2+ dorsalis pedis/posterior tibialis pulses. Psychiatric pleasant and cooperative. Notes Left foot: T the dorsal aspect there is an open wound with mostly granulation tissue and scant nonviable tissue. T the lateral aspect there is an open wound o o with mostly granulation tissue and some nonviable tissue. No signs of surrounding infection. Electronic Signature(s) Signed: 04/01/2021 3:20:15 PM By: Geralyn CorwinHoffman, Osby Sweetin DO Entered By: Geralyn CorwinHoffman, Gerlean Cid on 04/01/2021  15:17:57 -------------------------------------------------------------------------------- Physician Orders Details Patient Name: Date of Service: [facility]01/[facility] 2:00 PM Medical Record Number: [id number] Patient Account Number: [id number] Date of Birth/Sex: Treating RN: 10-May-1990 ([age] y.o. 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 - with Dr. Mikey BussingHoffman Return A Bathing/ Shower/ Hygiene May shower with protection but do not get wound dressing(s) wet. Off-Loading Other: - Open T Boot (given by Podiatry) oe Additional Orders / Instructions Follow Nutritious Diet - Monitor/Control Blood Sugars to aide in wound healing. Wound Treatment Wound #1 - Foot Wound Laterality: Dorsal, Left Cleanser: Soap and Water 1 x Per Day/15 Days Discharge Instructions: May shower and wash wound with dial antibacterial soap and water prior to dressing change. Peri-Wound Care: Sween Lotion (Moisturizing lotion) 1 x Per Day/15 Days Discharge Instructions: Apply moisturizing lotion as directed Prim Dressing: Hydrofera Blue Ready Foam, 4x5 in (Generic) 1 x Per Day/15 Days ary Discharge Instructions: Apply over Santyl Prim Dressing: Santyl Ointment 1 x Per Day/15 Days ary Discharge Instructions: ***APPLY ONLY TO SLOUGH (YELLOW/BROWN) AREAS*** Secondary Dressing: Woven Gauze Sponge, Non-Sterile 4x4 in (Generic) 1 x Per Day/15 Days Discharge Instructions:  Apply over primary dressing as directed. Secured With: [employer]erl[employer] Roll Sterile, 4.5x3.1 (in/yd) (Generic) 1 x Per Day/15 Days Discharge Instructions: Secure with Kerlix as directed. Secured With: 32M Medipore H Soft Cloth Surgical T ape, 4 x 10 (in/yd) (Generic) 1 x Per Day/15 Days Discharge Instructions: Secure with tape as directed. Wound #2 - Foot Wound Laterality: Left, Lateral Cleanser: Soap and Water 1 x Per Day/30 Days Discharge Instructions: May shower and wash wound with dial antibacterial soap and water prior to dressing change. Peri-Wound Care: Sween Lotion (Moisturizing lotion) 1 x Per Day/30 Days Discharge Instructions: Apply moisturizing lotion as directed Prim Dressing: Hydrofera Blue Ready Foam, 4x5 in 1 x Per Day/30 Days ary Discharge Instructions: Apply to wound bed as  instructed Prim Dressing: Santyl Ointment 1 x Per Day/30 Days ary Discharge Instructions: Apply nickel thick amount to wound bed as instructed Secondary Dressing: Woven Gauze Sponge, Non-Sterile 4x4 in (Generic) 1 x Per Day/30 Days Discharge Instructions: Apply over primary dressing as directed. Secured With: [employer], 4.5x3.1 (in/yd) (Generic) 1 x Per Day/30 Days Discharge Instructions: Secure with Kerlix as directed. Secured With: 72M Medipore H Soft Cloth Surgical T ape, 4 x 10 (in/yd) (Generic) 1 x Per Day/30 Days Discharge Instructions: Secure with tape as directed. Electronic Signature(s) Signed: 04/01/2021 3:20:15 PM By: Geralyn Corwin DO Entered By: Geralyn Corwin on 04/01/2021 15:18:22 -------------------------------------------------------------------------------- Problem List Details Patient Name: Date of Service: Kelli Hudson [facility] 04/01/2021 2:00 PM Medical Record Number: [id number] Patient Account Number: [id number] Date of Birth/Sex: Treating RN: 1990/05/12 (31 y.o. 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 [name] Primary Care Provider: [name] Other Clinician: Referring Provider: Treating Provider/Extender: Kevin Fenton in Treatment: 6 Subjective Chief Complaint Information obtained from Patient Left foot wounds History of Present Illness (HPI) Admission 02/14/2021 Ms. 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 While she was treating this wound she subsequently developed 3 additional wounds. These are located to the dorsal aspect and lateral aspect of the left foot and third toe. The wounds started out as  blisters and she thinks these developed due to putting on the dressing too tightly around her foot. Currently she has been using Santyl to the wound beds however she has been leaving this to air dry throughout the day. She reports finishing doxycycline and currently denies signs of infection. 1/6; patient presents for follow-up. She has been using Santyl to the areas of eschar and Hydrofera Blue to the toe wound. She reports improvement in wound healing. She has no issues or complaints today. She denies signs of infection. 1/23; patient presents for follow-up. She has been using Santyl to the wound beds without issues. She denies signs of infection. 1/30; patient presents for follow-up. She has been using Santyl to the lateral left foot wound and Santyl and Hydrofera Blue to the dorsal left foot wound. She denies signs of infection. She has no issues or complaints today. 2/6; patient presents for follow-up. She has no issues or complaints today. She denies signs of infection. 2/13; patient presents for follow-up. She has no  issues or complaints today. She denies signs of infection. Patient History Information obtained from Patient. Family History Cancer - Maternal Grandparents,Mother, Diabetes - Mother, No family history of Heart Disease, Hereditary Spherocytosis, Hypertension, Kidney Disease, Lung Disease, Seizures, Stroke, Thyroid Problems, Tuberculosis. Social History Never smoker, Marital Status - Single, Alcohol Use - Rarely, Drug Use - No History, Caffeine Use - Moderate. Medical History Hematologic/Lymphatic Patient has history of Anemia Endocrine Patient has history of Type I Diabetes - Since 2008 Neurologic Patient has history of Neuropathy Medical A Surgical History Notes nd Cardiovascular Tachycardia Gastrointestinal GERD Objective Constitutional respirations regular, non-labored and within target range for patient.. Vitals Time Taken: 2:06 PM, Height: 68 in, Weight: 183  lbs, BMI: 27.8, Temperature: 98.5 F, Pulse: 120 bpm, Respiratory Rate: 16 breaths/min, Blood Pressure: 99/67 mmHg, Capillary Blood Glucose: 256 mg/dl. Cardiovascular 2+ dorsalis pedis/posterior tibialis pulses. Psychiatric pleasant and cooperative. General Notes: Left foot: T the dorsal aspect there is an open wound with mostly granulation tissue and scant nonviable tissue. T the lateral aspect there is o o an open wound with mostly granulation tissue and some nonviable tissue. No signs of surrounding infection. Integumentary (Hair, Skin) Wound #1 status is Open. Original cause of wound was Gradually Appeared. The date acquired was: 01/04/2021. The wound has been in treatment 6 weeks. The wound is located on the Left,Dorsal Foot. The wound measures 1.4cm length x 1.5cm width x 0.1cm depth; 1.649cm^2 area and 0.165cm^3 volume. There is Fat Layer (Subcutaneous Tissue) exposed. There is no tunneling or undermining noted. There is a medium amount of serosanguineous drainage noted. The wound margin is distinct with the outline attached to the wound base. There is large (67-100%) red, pink, hyper - granulation within the wound bed. There is a small (1-33%) amount of necrotic tissue within the wound bed including Adherent Slough. Wound #2 status is Open. Original cause of wound was Blister. The date acquired was: 01/04/2021. The wound has been in treatment 6 weeks. The wound is located on the Left,Lateral Foot. The wound measures 2.4cm length x 2cm width x 0.2cm depth; 3.77cm^2 area and 0.754cm^3 volume. There is Fat Layer (Subcutaneous Tissue) exposed. There is no tunneling or undermining noted. There is a medium amount of serosanguineous drainage noted. The wound margin is distinct with the outline attached to the wound base. There is large (67-100%) red granulation within the wound bed. There is a small (1-33%) amount of necrotic tissue within the wound bed including Adherent  Slough. 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 I debrided nonviable tissue. I recommended continuing Hydrofera Blue with Santyl. Follow-up in 2 weeks. Procedures Wound #2 Pre-procedure diagnosis of Wound #2 is a Diabetic Wound/Ulcer of the Lower Extremity located on the Left,Lateral Foot .Severity of Tissue Pre Debridement is: Fat layer exposed. There was a Excisional Skin/Subcutaneous Tissue Debridement with a total area of 4.8 sq cm performed by Geralyn Corwin, DO. With the following instrument(s): Curette to remove Non-Viable tissue/material. Material removed includes Subcutaneous Tissue and Slough and after achieving pain control using Other (Benzocaine). No specimens were taken. A time out was conducted at 14:19, prior to the start of the procedure. A Minimum amount of bleeding was controlled with Pressure. The procedure was tolerated well. Post Debridement Measurements: 2.4cm  length x 2cm width x 0.2cm depth; 0.754cm^3 volume. Character of Wound/Ulcer Post Debridement is stable. Severity of Tissue Post Debridement is: Fat layer exposed. Post procedure Diagnosis Wound #2: Same as Pre-Procedure Plan Follow-up Appointments: Return Appointment in 2 weeks. - with Dr. Mikey BussingHoffman Bathing/ Shower/ Hygiene: May shower with protection but do not get wound dressing(s) wet. Off-Loading: Other: - Open T Boot (given by Podiatry) oe Additional Orders / Instructions: Follow Nutritious Diet - Monitor/Control Blood Sugars to aide in wound healing. WOUND #1: - Foot Wound Laterality: Dorsal, Left Cleanser: Soap and Water 1 x Per Day/15 Days Discharge Instructions: May shower and wash wound  with dial antibacterial soap and water prior to dressing change. Peri-Wound Care: Sween Lotion (Moisturizing lotion) 1 x Per Day/15 Days Discharge Instructions: Apply moisturizing lotion as directed Prim Dressing: Hydrofera Blue Ready Foam, 4x5 in (Generic) 1 x Per Day/15 Days ary Discharge Instructions: Apply over Santyl Prim Dressing: Santyl Ointment 1 x Per Day/15 Days ary Discharge Instructions: ***APPLY ONLY TO SLOUGH (YELLOW/BROWN) AREAS*** Secondary Dressing: Woven Gauze Sponge, Non-Sterile 4x4 in (Generic) 1 x Per Day/15 Days Discharge Instructions: Apply over primary dressing as directed. Secured With: [employer] Roll Sterile, 4.5x3.1 (in/yd) (Generic) 1 x Per Day/15 Days Discharge Instructions: Secure with Kerlix as directed. Secured With: 47M Medipore H Soft Cloth Surgical T ape, 4 x 10 (in/yd) (Generic) 1 x Per Day/15 Days Discharge Instructions: Secure with tape as directed. WOUND #2: - Foot Wound Laterality: Left, Lateral Cleanser: Soap and Water 1 x Per Day/30 Days Discharge Instructions: May shower and wash wound with dial antibacterial soap and water prior to dressing change. Peri-Wound Care: Sween Lotion (Moisturizing lotion) 1 x Per Day/30 Days Discharge Instructions: Apply moisturizing lotion as directed Prim Dressing: Hydrofera Blue Ready Foam, 4x5 in 1 x Per Day/30 Days ary Discharge Instructions: Apply to wound bed as instructed Prim Dressing: Santyl Ointment 1 x Per Day/30 Days ary Discharge Instructions: Apply nickel thick amount to wound bed as instructed Secondary Dressing: Woven Gauze Sponge, Non-Sterile 4x4 in (Generic) 1 x Per Day/30 Days Discharge Instructions: Apply over primary dressing as directed. Secured With: [employer] Roll Sterile, 4.5x3.1 (in/yd) (Generic) 1 x Per Day/30 Days Discharge Instructions: Secure with Kerlix as directed. Secured With: 47M Medipore H Soft Cloth Surgical T ape, 4 x 10 (in/yd) (Generic) 1 x Per Day/30 Days Discharge Instructions:  Secure with tape as directed. 1. In office sharp debridement 2. Hydrofera Blue and Santyl 3. Offload the wound bed 4. Follow-up in 2 weeks Electronic Signature(s) Signed: 04/01/2021 3:20:15 PM By: Geralyn CorwinHoffman, Merary Garguilo DO Entered By: Geralyn CorwinHoffman, Magdalena Skilton on 04/01/2021 15:19:18 -------------------------------------------------------------------------------- HxROS Details Patient Name: Date of Service: [facility] 2:00 PM Medical Record Number: [id number] Patient Account Number: [id number] Date of Birth/Sex: Treating RN: 03/17/1990 (31 y.o. Roel CluckF) [name], [name] Primary Care Provider: Herbie Drapeudd, Stephen Other Clinician: Referring Provider: Treating Provider/Extender: Kevin FentonHoffman, Orlando Thalmann Rudd, Stephen Weeks in Treatment: 6 Information Obtained From Patient Hematologic/Lymphatic Medical History: Positive for: Anemia Cardiovascular Medical History: Past Medical History Notes: Tachycardia Gastrointestinal Medical History: Past Medical History Notes: GERD Endocrine Medical History: Positive for: Type I Diabetes - Since 2008 Treated with: Insulin Blood sugar tested every day: Yes Tested : Dexcom Neurologic Medical History: Positive for: Neuropathy Immunizations Pneumococcal Vaccine: Received Pneumococcal Vaccination: Yes Received Pneumococcal Vaccination On or After 60th Birthday: No Implantable Devices None Family and Social History Cancer: Yes - Maternal Grandparents,Mother; Diabetes: Yes - Mother; Heart Disease: No; Hereditary Spherocytosis: No; Hypertension: No; Kidney Disease:  No; Lung Disease: No; Seizures: No; Stroke: No; Thyroid Problems: No; Tuberculosis: No; Never smoker; Marital Status - Single; Alcohol Use: Rarely; Drug Use: No History; Caffeine Use: Moderate; Financial Concerns: No; Food, Clothing or Shelter Needs: No; Support System Lacking: No; Transportation Concerns: No Electronic Signature(s) Signed: 04/01/2021 3:20:15 PM By: Geralyn Corwin  DO Signed: 04/01/2021 5:31:57 PM By: Antonieta Iba Entered By: Geralyn Corwin on 04/01/2021 15:17:20 -------------------------------------------------------------------------------- SuperBill Details Patient Name: Date of Service: Kelli Hudson [facility] 04/01/2021 Medical Record Number: [id number] Patient Account Number: [id number] Date of Birth/Sex: Treating RN: 12-10-1990 (31 y.o. 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) I10 Essential (primary) hypertension Facility Procedures CPT4 Code: [id number] Description: 11042 - DEB SUBQ TISSUE 20 SQ CM/< ICD-10 Diagnosis Description L97.528 Non-pressure chronic ulcer of other part of left foot with other specified seve Modifier: rity Quantity: 1 Physician Procedures : CPT4 Code Description Modifier [id number] 11042 - WC PHYS SUBQ TISS 20 SQ CM ICD-10 Diagnosis Description L97.528 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ty Quantity: 1 Electronic Signature(s) Signed: 04/01/2021 3:20:15 PM By: Geralyn Corwin DO Entered By: Geralyn Corwin on 04/01/2021 15:19:30

## 2021-04-02 ENCOUNTER — Other Ambulatory Visit: Payer: Self-pay | Admitting: Family Medicine

## 2021-04-10 ENCOUNTER — Other Ambulatory Visit: Payer: Self-pay

## 2021-04-10 ENCOUNTER — Ambulatory Visit: Payer: 59

## 2021-04-10 ENCOUNTER — Ambulatory Visit (INDEPENDENT_AMBULATORY_CARE_PROVIDER_SITE_OTHER): Payer: 59 | Admitting: Family Medicine

## 2021-04-10 VITALS — BP 130/84 | HR 105 | Temp 98.3°F | Ht 68.0 in | Wt 198.2 lb

## 2021-04-10 DIAGNOSIS — E1043 Type 1 diabetes mellitus with diabetic autonomic (poly)neuropathy: Secondary | ICD-10-CM

## 2021-04-10 DIAGNOSIS — E1021 Type 1 diabetes mellitus with diabetic nephropathy: Secondary | ICD-10-CM

## 2021-04-10 DIAGNOSIS — Z89422 Acquired absence of other left toe(s): Secondary | ICD-10-CM | POA: Insufficient documentation

## 2021-04-10 DIAGNOSIS — E1042 Type 1 diabetes mellitus with diabetic polyneuropathy: Secondary | ICD-10-CM | POA: Diagnosis not present

## 2021-04-10 DIAGNOSIS — E103593 Type 1 diabetes mellitus with proliferative diabetic retinopathy without macular edema, bilateral: Secondary | ICD-10-CM

## 2021-04-10 DIAGNOSIS — K3184 Gastroparesis: Secondary | ICD-10-CM

## 2021-04-10 DIAGNOSIS — E785 Hyperlipidemia, unspecified: Secondary | ICD-10-CM

## 2021-04-10 MED ORDER — ATORVASTATIN CALCIUM 20 MG PO TABS: 20.0000 mg | ORAL_TABLET | Freq: Every day | ORAL | 3 refills | Status: DC

## 2021-04-10 NOTE — Progress Notes (Signed)
[facility] [address] [place] [address] Dept: [phone] Dept Fax: [phone]  Chronic Care Office Visit  Subjective:    Patient ID: Kelli Hudson, female    DOB: 1990/09/26, 31 y.o..   MRN: [id number]  Chief Complaint  Patient presents with   Follow-up    F/u meds.  No concerns.     History of Present Illness:  Patient is in today for reassessment of chronic medical issues.  Kelli Hudson has a history of Type 1 diabetes complicated with gastroparesis, retinopathy, neuropathy, and nephropathy. She is currently on an insulin pump. She is managed by Dr. Kelton Pillar for her diabetes. She has an appointment next week for follow-up.  Gastroparesis- Last hospitalization was in early January. Doing well currently. She continues to take her Reglan and Pepcid.  Retinopathy- Has been receiving treatments by ophthalmology. She notes improvements in her left eye vision. She has been released back to driving.  Neuropathy- Had left 4th toe ray amputation in Nov. 2022. During aftercare, also developed an ulcer on the dorsum of her foot. She is currently working with wound management on ongoing care to heal her foot.  Nephropathy- Stable. Most recent GFR was > 60.  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 pylori infection 10/07/2019   Depression with anxiety 07/05/2019   Diabetic polyneuropathy associated with type 2 diabetes mellitus ([place]) 09/24/2017   Goiter 10/05/2009   Essential hypertension 10/05/2009   Past Surgical History:  Procedure Laterality Date   AMPUTATION TOE Left 03/10/2018   Procedure: AMPUTATION FIFTH TOE;  Surgeon: Trula Slade, DPM;  Location: [facility];  Service: Podiatry;  Laterality: Left;   BIOPSY  01/28/2020   Procedure: BIOPSY;  Surgeon: Otis Brace, MD;  Location: [facility];  Service: Gastroenterology;;   ESOPHAGOGASTRODUODENOSCOPY N/A 01/28/2020   Procedure: ESOPHAGOGASTRODUODENOSCOPY (EGD);  Surgeon: Otis Brace, MD;  Location: [facility];  Service: Gastroenterology;  Laterality: N/A;   ESOPHAGOGASTRODUODENOSCOPY (EGD) WITH PROPOFOL Left 09/08/2015   Procedure: ESOPHAGOGASTRODUODENOSCOPY (EGD) WITH PROPOFOL;  Surgeon: Arta Silence, MD;  Location: [facility];  Service: Endoscopy;  Laterality: Left;   WISDOM TOOTH EXTRACTION     Family History  Problem Relation Age of Onset   Lung cancer Mother    Diabetes Mother    Bipolar disorder Father    Liver cancer Maternal Grandfather    Breast cancer Other        maternal great aunt   Heart disease Other    Ovarian cancer Other        maternal great aunt   Pancreatic cancer Maternal Uncle    Prostate cancer Maternal Uncle    Diabetes Maternal Aunt  x 2   Kidney disease Other        maternal great aunt   Outpatient Medications Prior to Visit  Medication Sig Dispense Refill   albuterol (VENTOLIN HFA) 108 (90 Base) MCG/ACT inhaler Inhale 2 puffs into the lungs every 6 (six) hours as needed for shortness of breath.     collagenase (SANTYL)  ointment Apply 1 application topically daily. 90 g 1   Continuous Blood Gluc Receiver (DEXCOM G6 RECEIVER) DEVI USE AS DIRECTED (Patient taking differently: 1 each by Other route as directed.) 1 each 0   Continuous Blood Gluc Sensor (DEXCOM G6 SENSOR) MISC 1 each by Does not apply route daily. 3 each 12   Continuous Blood Gluc Transmit (DEXCOM G6 TRANSMITTER) MISC 1 each by Does not apply route daily. 1 each 3   famotidine (PEPCID) 20 MG tablet TAKE 1 TABLET(20 MG) BY MOUTH TWICE DAILY 60 tablet 0   hyoscyamine (LEVBID) 0.375 MG 12 hr tablet Take 1 tablet (0.375 mg total) by mouth every 12 (twelve) hours as needed for cramping (abdominal pain). 60 tablet 0   Insulin Pen Needle 31G X 5 MM MISC 1 Device by Does not apply route QID. For use with insulin pens 100 each 0   insulin regular (NOVOLIN R) 100 units/mL injection Inject 0-0.15 mLs (0-15 Units total) into the skin 3 (three) times daily before meals. 30 mL 0   lisinopril (ZESTRIL) 10 MG tablet Take 1 tablet (10 mg total) by mouth daily. 90 tablet 3   metoCLOPramide (REGLAN) 10 MG tablet Take 1 tablet (10 mg total) by mouth 3 (three) times daily before meals. 90 tablet 0   pantoprazole (PROTONIX) 40 MG tablet Take 40 mg by mouth daily.     promethazine (PHENERGAN) 12.5 MG tablet Take 1 tablet (12.5 mg total) by mouth every 6 (six) hours as needed for nausea or vomiting. 30 tablet 0   TRUEPLUS LANCETS 28G MISC assist with checking blood sugar TID and qhs (Patient taking differently: 1 each by Other route See admin instructions. assist with checking blood sugar TID and qhs) 100 each 3   Blood Glucose Monitoring Suppl (TRUE METRIX METER) w/Device KIT 1 Device by Does not apply route 4 (four) times daily. (Patient not taking: Reported on 04/10/2021) 1 kit 0   calcium gluconate 500 MG tablet Take 1 tablet (500 mg total) by mouth 3 (three) times daily. (Patient not taking: Reported on 02/24/2021) 270 tablet 3   glucose blood (TRUE METRIX BLOOD GLUCOSE TEST)  test strip Use as instructed (Patient not taking: Reported on 04/10/2021) 100 each 12   insulin glargine (LANTUS) 100 UNIT/ML injection Inject 0.18 mLs (18 Units total) into the skin daily. (Patient not taking: Reported on 04/10/2021) 10 mL 0   No facility-administered medications prior to visit.   No Known Allergies    Objective:   Today's Vitals   04/10/21 1319  BP: 130/84  Pulse: (!) 105  Temp: 98.3 F (36.8 C)  TempSrc: Temporal  SpO2: 98%  Weight: 198 lb 3.2 oz (89.9 kg)  Height: _0  (1.727 m)   Body mass index is 30.14 kg/m.   General: Well developed, well nourished. No acute distress. Foot: Dressing intact over left lateral foot. Surgical wound distally is well healed. It appears the dorsal ulceration is healed. The left leg is mildly swollen and has increased warmth. Psych: Alert and oriented x3. Normal mood and affect.  There are no preventive care reminders to display for this patient.  Lab Results Last lipids Lab Results  Component Value Date   CHOL 231 (H) 05/22/2020   HDL 76.30 05/22/2020   LDLCALC 132 (H) 05/22/2020   LDLDIRECT 83.0 07/05/2019   TRIG 113.0 05/22/2020   CHOLHDL 3 05/22/2020   Last hemoglobin A1c Lab Results  Component Value Date   HGBA1C 9.9 (H) 02/25/2021   BMP Latest Ref Rng & Units 02/28/2021 02/27/2021 02/27/2021  Glucose 70 - 99 mg/dL 153(H) - -  BUN 6 - 20 mg/dL 30(H) - -  Creatinine 0.44 - 1.00 mg/dL 1.00 - -  BUN/Creat Ratio 9 - 23 - - -  Sodium 135 - 145 mmol/L 135 - -  Potassium 3.5 - 5.1 mmol/L 3.9 3.9 3.9  Chloride 98 - 111 mmol/L 98 - -  CO2 22 - 32 mmol/L 28 - -  Calcium 8.9 - 10.3 mg/dL 9.0 - -      Assessment & Plan:   1. 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 I will defer to Dr. Kelton Pillar for ongoing  management of her insulin pump.  2. Proliferative diabetic retinopathy of both eyes associated with type 1 diabetes mellitus, macular edema presence unspecified ([place]) Apparently improving. She will continue to follow with her ophthalmologist.  3. Diabetic nephropathy associated with type 1 diabetes mellitus ([place]) Renal function stable. Blood pressure in adequate control.  4. Diabetic polyneuropathy associated with type 1 diabetes mellitus ([place]) Continue efforts to optimize blood sugar control and careful foot care to prevent further amputations.  5. Diabetic gastroparesis associated with type 1 diabetes mellitus ([place]) Stable. Continue Reglan and Pepcid.  6. Borderline hyperlipidemia Ms. Johnson-Alston is not on a statin. I will check her fasting lipids. Due tot he degree of microvascular complications she has, she would be at high-risk for CVD. I recommend we start her on a statin.  - Lipid panel; Future - atorvastatin (LIPITOR) 20 MG tablet; Take 1 tablet (20 mg total) by mouth daily.  Dispense: 90 tablet; Refill: 3  7. 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 I will check labs to see if there is indication on ongoing infection.  - C-reactive protein; Future - Sedimentation rate; Future - CBC with Differential/Platelet; Future  Return in about 3 months (around 07/08/2021) for Reassessment.    [name], MD

## 2021-04-10 NOTE — Chronic Care Management (AMB) (Signed)
Care Management    RN Visit Note  04/10/2021 Name: Kelli Hudson MRN: [id number] DOB: Sep 08, 1990  Subjective: Kelli Hudson is a 31 y.o. year old female who is a primary care patient of Rudd, Lillette Boxer, MD. The care management team was consulted for assistance with disease management and care coordination needs.    Engaged with patient by telephone for follow up visit in response to provider referral for case management and/or care coordination services.   Consent to Services:   Ms. Soliday was given information about Care Management services today including:  Care Management services includes personalized support from designated clinical staff supervised by her physician, including individualized plan of care and coordination with other care providers 24/7 contact phone numbers for assistance for urgent and routine care needs. The patient may stop case management services at any time by phone call to the office staff.  Patient agreed to services and consent obtained.   Assessment: Review of patient past medical history, allergies, medications, health status, including review of consultants reports, laboratory and other test data, was performed as part of comprehensive evaluation and provision of chronic care management services.   SDOH (Social Determinants of Health) assessments and interventions performed:    Care Plan  No Known Allergies  Outpatient Encounter Medications as of 04/10/2021  Medication Sig   albuterol (VENTOLIN HFA) 108 (90 Base) MCG/ACT inhaler Inhale 2 puffs into the lungs every 6 (six) hours as needed for shortness of breath.   atorvastatin (LIPITOR) 20 MG tablet Take 1 tablet (20 mg total) by mouth daily.   collagenase (SANTYL) ointment Apply 1 application topically daily.   famotidine (PEPCID) 20 MG tablet TAKE 1 TABLET(20 MG) BY MOUTH TWICE DAILY   hyoscyamine (LEVBID) 0.375 MG 12 hr tablet Take 1 tablet (0.375 mg total) by mouth every 12  (twelve) hours as needed for cramping (abdominal pain).   insulin regular (NOVOLIN R) 100 units/mL injection Inject 0-0.15 mLs (0-15 Units total) into the skin 3 (three) times daily before meals.   lisinopril (ZESTRIL) 10 MG tablet Take 1 tablet (10 mg total) by mouth daily.   metoCLOPramide (REGLAN) 10 MG tablet Take 1 tablet (10 mg total) by mouth 3 (three) times daily before meals.   pantoprazole (PROTONIX) 40 MG tablet Take 40 mg by mouth daily.   promethazine (PHENERGAN) 12.5 MG tablet Take 1 tablet (12.5 mg total) by mouth every 6 (six) hours as needed for nausea or vomiting.   Blood Glucose Monitoring Suppl (TRUE METRIX METER) w/Device KIT 1 Device by Does not apply route 4 (four) times daily. (Patient not taking: Reported on 04/10/2021)   calcium gluconate 500 MG tablet Take 1 tablet (500 mg total) by mouth 3 (three) times daily. (Patient not taking: Reported on 02/24/2021)   Continuous Blood Gluc Receiver (DEXCOM G6 RECEIVER) DEVI USE AS DIRECTED (Patient taking differently: 1 each by Other route as directed.)   Continuous Blood Gluc Sensor (DEXCOM G6 SENSOR) MISC 1 each by Does not apply route daily.   Continuous Blood Gluc Transmit (DEXCOM G6 TRANSMITTER) MISC 1 each by Does not apply route daily.   glucose blood (TRUE METRIX BLOOD GLUCOSE TEST) test strip Use as instructed (Patient not taking: Reported on 04/10/2021)   insulin glargine (LANTUS) 100 UNIT/ML injection Inject 0.18 mLs (18 Units total) into the skin daily. (Patient not taking: Reported on 04/10/2021)   Insulin Pen Needle 31G X 5 MM MISC 1 Device by Does not apply route QID. For use with insulin pens  TRUEPLUS LANCETS 28G MISC assist with checking blood sugar TID and qhs (Patient taking differently: 1 each by Other route See admin instructions. assist with checking blood sugar TID and qhs)   [DISCONTINUED] insulin aspart (NOVOLOG) 100 UNIT/ML FlexPen Inject 5 Units into the skin 3 (three) times daily with meals.   No  facility-administered encounter medications on file as of 04/10/2021.    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 pylori infection 10/07/2019   Depression with anxiety 07/05/2019   Diabetic polyneuropathy associated with type 2 diabetes mellitus ([place]) 09/24/2017   Goiter 10/05/2009   Essential hypertension 10/05/2009    Conditions to be addressed/monitored: DMII and gastroparesis  Care Plan : [facility] plan of care  Updates made by Dannielle Karvonen, RN since 04/10/2021 12:00 AM     Problem: Chronic disease management education and/ or care coordination needs.   Priority: High     Long-Range Goal: Development of plan of care to address chronic disease management and/ or care coordination needs.   Start Date: 01/23/2021  Expected End Date: 07/17/2021  Priority: High  Note:   Current Barriers:  Knowledge Deficits related to plan of care for management of Diabetes I and gastroparesis  Chronic Disease Management support and education needs related to Diabetes I and gastroparesis Patient states she is doing well.  Reports most recent hospitalization was 02/24/21 for hyperglycemia and  gastroparesis.  Patient states she is being followed by the wound clinic for wound to left top of foot. Patient states she goes to wound center every other week and does daily dressing changes at home.  Patient reports having one low blood pressure reading x 1 within the past 2 weeks.  RNCM reviewed Hypoglycemia management with patient.  Patient states she sees nutritionist now every 2 months for insulin pump management.  Patient reports seeing her primary care provider today.  Patient states she received a referral from her retina specialist due to her right eye cataract.   RNCM Clinical Goal(s):  Patient will verbalize basic understanding of Diabetes I and gastroparesis disease process and self health management plan as evidenced by patient report and/ or notation in chart.  take all medications exactly as prescribed and will call provider for medication related questions as evidenced by patient report and /or notation in chart    attend all scheduled medical appointments:   as evidenced by patient report and / or notation in chart        continue to work with RN Care Manager and/or Social Worker to address care management and care coordination needs related to Diabetes I and gastroparesis as evidenced by adherence to CM Team Scheduled appointments     through collaboration with Consulting civil engineer, provider, and care  team.  Patient will have decrease in hospital admissions / ED visits.  Patient will continue follow up with nutritionist Patient will continue to work with endocrinologist/ nutritionist to obtain insulin pump  Interventions: 1:1 collaboration with primary care provider regarding development and update of comprehensive plan of care as evidenced by provider attestation and co-signature Inter-disciplinary care team collaboration (see longitudinal plan of care) Evaluation of current treatment plan related to  self management and patient's adherence to plan as established by  provider   Diabetes Interventions:  (Status:  Goal on track:  Yes.) Long Term Goal Assessed patient's understanding of A1c goal: <7% Reviewed medications with patient and discussed importance of medication adherence Discussed plans with patient for ongoing care management follow up and provided patient with direct contact information for care management team Reviewed scheduled/upcoming provider appointments  Advised to continue follow up with dressing changes and wound clinic follow up  as recommended.   Lab Results  Component Value Date   HGBA1C 9.8 (H) 11/12/2020   Gastroparesis  (Status:  Goal on track:  Yes.)  Long Term Goal Evaluation of current treatment plan related to  gastroparesis ,  self-management and patient's adherence to plan as established by provider. Discussed plans with patient for ongoing care management follow up and provided patient with direct contact information for care management team Evaluation of current treatment plan related to gastroparesis and patient's adherence to plan as established by provider Reviewed medications with patient and discussed  Reviewed scheduled/upcoming provider appointments  Discussed plans with patient for ongoing care management follow up and provided patient with direct contact information for care management team  Advised patient to continue to follow up with nutritionist regarding appropriate food choices Advised to monitor blood sugars at least 3 times per day as instructed by endocrinologist.  Advised to continue to eat small frequent meals, chewing foods thoroughly.   Patient Goals/Self-Care Activities: Take medications as prescribed   Attend all scheduled provider appointments Call pharmacy for medication refills 3-7 days in advance of running out of medications Call provider office for new concerns or questions  Eat smaller meals more frequently and chew foods thoroughly Follow meal plan as advised by nutritionist Monitor  blood sugars at least 3 times per day Continue to follow up with nutritionist as recommended.        Plan: The patient has been provided with contact information for the care management team and has been advised to call with any health related questions or concerns.  The care management team will reach out to the patient again over the next 45  days.  Quinn Plowman RN,BSN,CCM RN Case Manager [place] [phone]

## 2021-04-10 NOTE — Patient Instructions (Signed)
°  Visit Information  Thank you for taking time to visit with me today. Please don't hesitate to contact me if I can be of assistance to you before our next scheduled telephone appointment.  Following are the goals we discussed today:  Take medications as prescribed   Attend all scheduled provider appointments Call pharmacy for medication refills 3-7 days in advance of running out of medications Call provider office for new concerns or questions  Eat smaller meals more frequently and chew foods thoroughly Follow meal plan as advised by nutritionist Monitor blood sugars at least 3 times per day Continue to follow up with nutritionist as recommended.   Our next appointment is by telephone on 05/22/21 at 10am  Please call the care guide team at [phone] if you need to cancel or reschedule your appointment.   If you are experiencing a Mental Health or [place] or need someone to talk to, please call the Suicide and Crisis Lifeline: 988 call 1-800-273-TALK (toll free, 24 hour hotline)   Patient verbalizes understanding of instructions and care plan provided today and agrees to view in [place]. Active MyChart status confirmed with patient.    The patient has been provided with contact information for the care management team and has been advised to call with any health related questions or concerns.  The care management team will reach out to the patient again over the next 45 days.   Quinn Plowman RN,BSN,CCM RN Case Manager [place] [facility] [phone]

## 2021-04-15 ENCOUNTER — Encounter (HOSPITAL_BASED_OUTPATIENT_CLINIC_OR_DEPARTMENT_OTHER): Payer: 59 | Admitting: Internal Medicine

## 2021-04-15 ENCOUNTER — Other Ambulatory Visit: Payer: Self-pay

## 2021-04-15 ENCOUNTER — Ambulatory Visit: Payer: 59 | Admitting: Internal Medicine

## 2021-04-15 DIAGNOSIS — E1042 Type 1 diabetes mellitus with diabetic polyneuropathy: Secondary | ICD-10-CM | POA: Diagnosis not present

## 2021-04-15 DIAGNOSIS — E10621 Type 1 diabetes mellitus with foot ulcer: Secondary | ICD-10-CM

## 2021-04-15 DIAGNOSIS — L97528 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ty: Secondary | ICD-10-CM | POA: Diagnosis not present

## 2021-04-15 NOTE — Progress Notes (Signed)
Kelli Hudson, Kelli Hudson ([id number]) Visit Report for 04/15/2021 Chief Complaint Document Details Patient Name: Date of Service: Kelli Hudson [facility] 04/15/2021 2:00 PM Medical Record Number: [id number] Patient Account Number: [id number] Date of Birth/Sex: Treating RN: 11-Jan-1991 (31 y.o. F) Primary Care Provider: Arlester Marker Other Clinician: Referring Provider: Treating Provider/Extender: Fermin Schwab in Treatment: 8 Information Obtained from: Patient Chief Complaint Left foot wounds Electronic Signature(s) Signed: 04/15/2021 2:57:01 PM By: Kalman Shan DO Entered By: Kalman Shan on 04/15/2021 14:52:05 -------------------------------------------------------------------------------- Debridement Details Patient Name: Date of Service: Kelli Hudson [facility] 04/15/2021 2:00 PM Medical Record Number: [id number] Patient Account Number: [id number] Date of Birth/Sex: Treating RN: [birth year] (31 y.o. [name], Meta.Reding Primary Care Provider: Arlester Marker Other Clinician: Referring Provider: Treating Provider/Extender: Fermin Schwab in Treatment: 8 Debridement Performed for Assessment: Wound #2 Left,Lateral Foot Performed By: Physician Kalman Shan, DO Debridement Type: Debridement Severity of Tissue Pre Debridement: Fat layer exposed Level of Consciousness (Pre-procedure): Awake and Alert Pre-procedure Verification/Time Out Yes - 14:40 Taken: Start Time: 14:41 Pain Control: Lidocaine 4% T opical Solution T Area Debrided (L x W): otal 1.5 (cm) x 1.5 (cm) = 2.25 (cm) Tissue and other material debrided: Viable, Non-Viable, Slough, Subcutaneous, Skin: Dermis , Skin: Epidermis, Fibrin/Exudate, Slough Level: Skin/Subcutaneous Tissue Debridement Description: Excisional Instrument: Curette Bleeding: Minimum Hemostasis Achieved: Pressure End Time: 14:46 Procedural Pain: 0 Post Procedural Pain: 0 Response to Treatment:  Procedure was tolerated well Level of Consciousness (Post- Awake and Alert procedure): Post Debridement Measurements of Total Wound Length: (cm) 1.5 Width: (cm) 1.5 Depth: (cm) 0.1 Volume: (cm) 0.177 Character of Wound/Ulcer Post Debridement: Improved Severity of Tissue Post Debridement: Fat layer exposed Post Procedure Diagnosis Same as Pre-procedure Electronic Signature(s) Signed: 04/15/2021 2:57:01 PM By: Kalman Shan DO Signed: 04/15/2021 4:45:57 PM By: Deon Pilling RN, BSN Entered By: Deon Pilling on 04/15/2021 14:47:10 -------------------------------------------------------------------------------- HPI Details Patient Name: Date of Service: Kelli Hudson [facility] 04/15/2021 2:00 PM Medical Record Number: [id number] Patient Account Number: [id number] Date of Birth/Sex: Treating RN: 1990/04/11 (31 y.o. 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 While she was treating this wound she subsequently developed 3 additional wounds. These are located to the dorsal aspect and lateral aspect of the left foot and third toe. The wounds started out as blisters and she thinks these developed due to putting on the dressing too tightly around her foot. Currently she has been using Santyl to the wound beds however she has been leaving this to air dry throughout the day. She reports finishing doxycycline and currently denies signs of infection. 1/6; patient presents for  follow-up. She has been using Santyl to the areas of eschar and Hydrofera Blue to the toe wound. She reports improvement in wound healing. She has no issues or complaints today. She denies signs of infection. 1/23; patient presents for follow-up. She has been using Santyl to the wound beds without issues. She denies signs of infection. 1/30; patient presents for follow-up. She has been using Santyl to  the lateral left foot wound and Santyl and Hydrofera Blue to the dorsal left foot wound. She denies signs of infection. She has no issues or complaints today. 2/6; patient presents for follow-up. She has no issues or complaints today. She denies signs of infection. 2/27; patient presents for follow-up. She has no issues or complaints today. She continues to use Santyl and [employer]. She denies signs of infection. 2/13; patient presents for follow-up. She has no issues or complaints today. She denies signs of infection. Electronic Signature(s) Signed: 04/15/2021 2:57:01 PM By: Kalman Shan DO Entered By: Kalman Shan on 04/15/2021 14:52:32 -------------------------------------------------------------------------------- Physical Exam Details Patient Name: Date of Service: Kelli Hudson [facility] 04/15/2021 2:00 PM Medical Record Number: [id number] Patient Account Number: [id number] Date of Birth/Sex: Treating RN: 1990-12-13 (31 y.o. F) Primary Care Provider: Arlester Marker Other Clinician: Referring Provider: Treating Provider/Extender: Fermin Schwab in Treatment: 8 Constitutional respirations regular, non-labored and within target range for patient.. Cardiovascular 2+ dorsalis pedis/posterior tibialis pulses. Psychiatric pleasant and cooperative. Notes Left foot: T the dorsal aspect there are 2 small wounds. The more distal wound has granulation tissue and the more proximal wound has a scab. No signs of o surrounding infection. T the lateral aspect there  is an open wound with granulation tissue and scant nonviable tissue. No surrounding signs of infection. o Electronic Signature(s) Signed: 04/15/2021 2:57:01 PM By: Kalman Shan DO Entered By: Kalman Shan on 04/15/2021 14:53:27 -------------------------------------------------------------------------------- Physician Orders Details Patient Name: Date of Service: Kelli Hudson [facility] 04/15/2021 2:00 PM Medical Record Number: [id number] Patient Account Number: [id number] Date of Birth/Sex: Treating RN: 08/07/1990 (31 y.o. 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 - with Dr. Heber [place] and [name] Room 7 Return A Bathing/ Shower/ Hygiene May shower with protection but do not get wound dressing(s) wet. Off-Loading Other: - Open T Boot (given by Podiatry) oe Additional Orders / Instructions Follow Nutritious Diet - Monitor/Control Blood Sugars to aide in wound healing. Wound Treatment Wound #1 - Foot Wound Laterality: Dorsal, Left Cleanser: Soap and Water Every Other Day/15 Days Discharge Instructions: May shower and wash wound with dial antibacterial soap and water prior to dressing change. Peri-Wound Care: Sween Lotion (Moisturizing lotion) Every Other Day/15 Days Discharge Instructions: Apply moisturizing lotion as directed Topical: Vaseline Every Other Day/15 Days Discharge Instructions: Apply Vaseline to scabbed area. Prim Dressing: Hydrofera  Blue Ready Foam, 4x5 in (Generic) Every Other Day/15 Days ary Discharge Instructions: Apply over Santyl Secondary Dressing: Woven Gauze Sponge, Non-Sterile 4x4 in (Generic) Every Other Day/15 Days Discharge Instructions: Apply over primary dressing as directed. Secured With: [employer], 4.5x3.1 (in/yd) (Generic) Every Other Day/15 Days Discharge Instructions: Secure with Kerlix as directed. Secured With: 8M Medipore H Soft Cloth Surgical Tape, 4 x 10 (in/yd) (Generic) Every Other Day/15 Days Discharge Instructions: Secure with tape as directed. Wound #2 - Foot Wound Laterality: Left, Lateral Cleanser: Soap and Water Every Other Day/30 Days Discharge Instructions: May shower and wash wound with dial antibacterial soap and water prior to dressing change.  Peri-Wound Care: Sween Lotion (Moisturizing lotion) Every Other Day/30 Days Discharge Instructions: Apply moisturizing lotion as directed Prim Dressing: Hydrofera Blue Ready Foam, 4x5 in Every Other Day/30 Days ary Discharge Instructions: Apply to wound bed as instructed Secondary Dressing: Woven Gauze Sponge, Non-Sterile 4x4 in (Generic) Every Other Day/30 Days Discharge Instructions: Apply over primary dressing as directed. Secured With: [employer], 4.5x3.1 (in/yd) (Generic) Every Other Day/30 Days Discharge Instructions: Secure with Kerlix as directed. Secured With: 38M Medipore H Soft Cloth Surgical T ape, 4 x 10 (in/yd) (Generic) Every Other Day/30 Days Discharge Instructions: Secure with tape as directed. Electronic Signature(s) Signed: 04/15/2021 2:57:01 PM By: Kalman Shan DO Entered By: Kalman Shan on 04/15/2021 14:54:54 -------------------------------------------------------------------------------- Problem List Details Patient Name: Date of Service: Kelli Hudson [facility] 04/15/2021 2:00 PM Medical Record Number: [id number] Patient Account Number: [id number] Date of Birth/Sex: Treating  RN: 1990-05-25 (31 y.o. 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 02/14/2021 No Yes Z89.422 Acquired absence of other left toe(s) 02/14/2021 No Yes I10 Essential (primary) hypertension 02/14/2021 No Yes Inactive Problems Resolved Problems Electronic Signature(s) Signed: 04/15/2021 2:57:01 PM By: Kalman Shan DO Entered By: Kalman Shan on 04/15/2021 14:51:39 -------------------------------------------------------------------------------- Progress Note Details Patient Name: Date of Service: Kelli Hudson [facility] 04/15/2021 2:00 PM Medical Record Number: [id number] Patient Account Number: [id number] Date of Birth/Sex: Treating RN: Apr 10, 1990 (31 y.o. 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 While she was treating this wound she subsequently developed 3 additional wounds. These are located to the dorsal aspect and lateral aspect of the left  foot and third toe. The wounds started out as blisters and she thinks these developed due to putting on the dressing too tightly around her foot. Currently she has been using Santyl to the wound beds however she has been leaving this to air dry throughout the day. She reports finishing doxycycline and currently denies signs of infection. 1/6; patient presents for follow-up. She has been using Santyl to the areas of eschar and Hydrofera Blue to the toe wound. She reports improvement in wound healing. She has no issues or complaints today. She denies signs of infection. 1/23; patient presents for follow-up. She has been using Santyl to the wound beds without issues. She denies signs of infection. 1/30; patient presents for follow-up. She has been using Santyl to the lateral left foot wound and Santyl and Hydrofera Blue to the dorsal left foot wound. She denies signs of infection. She has no issues or complaints today. 2/6; patient presents for follow-up. She has no issues or complaints today. She denies signs of infection. 2/27; patient presents for follow-up. She  has no issues or complaints today. She continues to use Santyl and [employer]. She denies signs of infection. 2/13; patient presents for follow-up. She has no issues or complaints today. She denies signs of infection. Patient History Information obtained from Patient. Family History Cancer - Maternal Grandparents,Mother, Diabetes - Mother, No family history of Heart Disease, Hereditary Spherocytosis, Hypertension, Kidney Disease, Lung Disease, Seizures, Stroke, Thyroid Problems, Tuberculosis. Social History Never smoker, Marital Status - Single, Alcohol Use - Rarely, Drug Use - No History, Caffeine Use - Moderate. Medical History Hematologic/Lymphatic Patient has history of Anemia Endocrine Patient has history of Type I Diabetes - Since 2008 Neurologic Patient has history of Neuropathy Medical A Surgical History  Notes nd Cardiovascular Tachycardia Gastrointestinal GERD Objective Constitutional respirations regular, non-labored and within target range for patient.. Vitals Time Taken: 2:18 PM, Height: 68 in, Weight: 183 lbs, BMI: 27.8, Temperature: 98.6 F, Pulse: 108 bpm, Respiratory Rate: 18 breaths/min, Blood Pressure: 126/74 mmHg, Capillary Blood Glucose: 154 mg/dl. Cardiovascular 2+ dorsalis pedis/posterior tibialis pulses. Psychiatric pleasant and cooperative. General Notes: Left foot: T the dorsal aspect there are 2 small wounds. The more distal wound has granulation tissue and the more proximal wound has a o scab. No signs of surrounding infection. T the lateral aspect there is an open wound with granulation tissue and scant nonviable tissue. No surrounding signs o of infection. Integumentary (Hair, Skin) Wound #1 status is Open. Original cause of wound was Gradually Appeared. The date acquired was: 01/04/2021. The wound has been in treatment 8 weeks. The wound is located on the Left,Dorsal Foot. The wound measures 0.5cm length x 0.5cm width x 0.1cm depth; 0.196cm^2 area and 0.02cm^3 volume. There is Fat Layer (Subcutaneous Tissue) exposed. There is no tunneling or undermining noted. There is a medium amount of serosanguineous drainage noted. The wound margin is distinct with the outline attached to the wound base. There is medium (34-66%) red granulation within the wound bed. There is a medium (34-66%) amount of necrotic tissue within the wound bed including Adherent Slough. Wound #2 status is Open. Original cause of wound was Blister. The date acquired was: 01/04/2021. The wound has been in treatment 8 weeks. The wound is located on the Left,Lateral Foot. The wound measures 1.5cm length x 1.5cm width x 0.1cm depth; 1.767cm^2 area and 0.177cm^3 volume. There is Fat Layer (Subcutaneous Tissue) exposed. There is no tunneling or undermining noted. There is a medium amount of serosanguineous  drainage noted. The wound margin is distinct with the outline attached to the wound base. There is large (67-100%) red granulation within the wound bed. There is a small (1-33%) amount of necrotic tissue within the wound bed including Adherent Slough. 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 I debrided nonviable tissue. T the dorsal foot wounds I recommended o continuing Hydrofera Blue to the more distal wound and Vaseline to the more proximal wound. T the lateral wound I recommended Hydrofera Blue. No signs o of surrounding infection. Follow-up in 2 weeks. Procedures Wound #2 Pre-procedure diagnosis of Wound #2 is a Diabetic Wound/Ulcer of the Lower Extremity located on the Left,Lateral Foot .Severity of Tissue Pre Debridement is: Fat layer exposed. There was a Excisional Skin/Subcutaneous Tissue Debridement with a total area of 2.25  sq cm performed by Kalman Shan, DO. With the following instrument(s): Curette to remove Viable and Non-Viable tissue/material. Material removed includes Subcutaneous Tissue, Slough, Skin: Dermis, Skin: Epidermis, and Fibrin/Exudate after achieving pain control using Lidocaine 4% Topical Solution. A time out was conducted at 14:40, prior to the start of the procedure. A Minimum amount of bleeding was controlled with Pressure. The procedure was tolerated well with a pain level of 0 throughout and a pain level of 0 following the procedure. Post Debridement Measurements: 1.5cm length x 1.5cm width x 0.1cm depth; 0.177cm^3 volume. Character of Wound/Ulcer Post Debridement is improved. Severity of Tissue Post Debridement is: Fat layer  exposed. Post procedure Diagnosis Wound #2: Same as Pre-Procedure Plan Follow-up Appointments: Return Appointment in 2 weeks. - with Dr. Heber [place] and [place] 7 Bathing/ Shower/ Hygiene: May shower with protection but do not get wound dressing(s) wet. Off-Loading: Other: - Open T Boot (given by Podiatry) oe Additional Orders / Instructions: Follow Nutritious Diet - Monitor/Control Blood Sugars to aide in wound healing. WOUND #1: - Foot Wound Laterality: Dorsal, Left Cleanser: Soap and Water Every Other Day/15 Days Discharge Instructions: May shower and wash wound with dial antibacterial soap and water prior to dressing change. Peri-Wound Care: Sween Lotion (Moisturizing lotion) Every Other Day/15 Days Discharge Instructions: Apply moisturizing lotion as directed Topical: Vaseline Every Other Day/15 Days Discharge Instructions: Apply Vaseline to scabbed area. Prim Dressing: Hydrofera Blue Ready Foam, 4x5 in (Generic) Every Other Day/15 Days ary Discharge Instructions: Apply over Santyl Secondary Dressing: Woven Gauze Sponge, Non-Sterile 4x4 in (Generic) Every Other Day/15 Days Discharge Instructions: Apply over primary dressing as directed. Secured With: [employer], 4.5x3.1 (in/yd) (Generic) Every Other Day/15 Days Discharge Instructions: Secure with Kerlix as directed. Secured With: 48M Medipore H Soft Cloth Surgical T ape, 4 x 10 (in/yd) (Generic) Every Other Day/15 Days Discharge Instructions: Secure with tape as directed. WOUND #2: - Foot Wound Laterality: Left, Lateral Cleanser: Soap and Water Every Other Day/30 Days Discharge Instructions: May shower and wash wound with dial antibacterial soap and water prior to dressing change. Peri-Wound Care: Sween Lotion (Moisturizing lotion) Every Other Day/30 Days Discharge Instructions: Apply moisturizing lotion as directed Prim Dressing: Hydrofera Blue Ready Foam, 4x5 in Every Other Day/30 Days ary Discharge Instructions: Apply  to wound bed as instructed Secondary Dressing: Woven Gauze Sponge, Non-Sterile 4x4 in (Generic) Every Other Day/30 Days Discharge Instructions: Apply over primary dressing as directed. Secured With: [employer], 4.5x3.1 (in/yd) (Generic) Every Other Day/30 Days Discharge Instructions: Secure with Kerlix as directed. Secured With: 48M Medipore H Soft Cloth Surgical T ape, 4 x 10 (in/yd) (Generic) Every Other Day/30 Days Discharge Instructions: Secure with tape as directed. 1. Enough for sharp debridement 2. Hydrofera Blue 3. Vaseline 4. Follow-up in 2 weeks Electronic Signature(s) Signed: 04/15/2021 2:57:01 PM By: Kalman Shan DO Entered By: Kalman Shan on 04/15/2021 14:56:23 -------------------------------------------------------------------------------- HxROS Details Patient Name: Date of Service: Kelli Hudson [facility] 04/15/2021 2:00 PM Medical Record Number: [id number] Patient Account Number: [id number] Date of Birth/Sex: Treating RN: [birth year] (31 y.o. F) Primary Care Provider: Arlester Marker Other Clinician: Referring Provider: Treating Provider/Extender: Fermin Schwab in Treatment: 8 Information Obtained From Patient Hematologic/Lymphatic Medical History: Positive for: Anemia Cardiovascular Medical History: Past Medical History Notes: Tachycardia Gastrointestinal Medical History: Past Medical History Notes: GERD Endocrine Medical History: Positive for: Type I Diabetes - Since 2008 Treated with: Insulin Blood sugar tested every day: Yes Tested : Dexcom Neurologic Medical  History: Positive for: Neuropathy Immunizations Pneumococcal Vaccine: Received Pneumococcal Vaccination: Yes Received Pneumococcal Vaccination On or After 60th Birthday: No Implantable Devices None Family and Social History Cancer: Yes - Maternal Grandparents,Mother; Diabetes: Yes - Mother; Heart Disease: No; Hereditary Spherocytosis: No;  Hypertension: No; Kidney Disease: No; Lung Disease: No; Seizures: No; Stroke: No; Thyroid Problems: No; Tuberculosis: No; Never smoker; Marital Status - Single; Alcohol Use: Rarely; Drug Use: No History; Caffeine Use: Moderate; Financial Concerns: No; Food, Clothing or Shelter Needs: No; Support System Lacking: No; Transportation Concerns: No Electronic Signature(s) Signed: 04/15/2021 2:57:01 PM By: Kalman Shan DO Entered By: Kalman Shan on 04/15/2021 14:52:40 -------------------------------------------------------------------------------- SuperBill Details Patient Name: Date of Service: Kelli Hudson [facility] 04/15/2021 Medical Record Number: [id number] Patient Account Number: [id number] Date of Birth/Sex: Treating RN: 16-Jun-1990 (31 y.o. 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

## 2021-04-15 NOTE — Progress Notes (Signed)
KOI, YARBRO ([id number]) Visit Report for 04/15/2021 Arrival Information Details Patient Name: Date of Service: Kelli Hudson [facility] 04/15/2021 2:00 PM Medical Record Number: [id number] Patient Account Number: [id number] Date of Birth/Sex: Treating RN: Jun 03, 1990 (31 y.o. Kelli Hudson Primary Care Tishina Lown: Arlester Marker Other Clinician: Referring Preston Weill: Treating Bertel Venard/Extender: Fermin Schwab in Treatment: 8 Visit Information History Since Last Visit Added or deleted any medications: No Patient Arrived: Ambulatory Any new allergies or adverse reactions: No Arrival Time: 14:18 Had a fall or experienced change in No Accompanied By: friend activities of daily living that may affect Transfer Assistance: None risk of falls: Patient Identification Verified: Yes Signs or symptoms of abuse/neglect since last visito No Secondary Verification Process Completed: Yes Hospitalized since last visit: No Patient Requires Transmission-Based Precautions: No Implantable device outside of the clinic excluding No Patient Has Alerts: No cellular tissue based products placed in the center since last visit: Has Dressing in Place as Prescribed: Yes Pain Present Now: No Electronic Signature(s) Signed: 04/15/2021 4:54:19 PM By: Baruch Gouty RN, BSN Entered By: Baruch Gouty on 04/15/2021 14:19:21 -------------------------------------------------------------------------------- Encounter Discharge Information Details Patient Name: Date of Service: Kelli Hudson [facility] 04/15/2021 2:00 PM Medical Record Number: [id number] Patient Account Number: [id number] Date of Birth/Sex: Treating RN: [birth year] (31 y.o. Kelli Hudson Primary Care Reginold Beale: Arlester Marker Other Clinician: Referring Dream Nodal: Treating Avelino Herren/Extender: Fermin Schwab in Treatment: 8 Encounter Discharge Information Items Post Procedure  Vitals Discharge Condition: Stable Temperature (F): 98.6 Ambulatory Status: Ambulatory Pulse (bpm): 108 Discharge Destination: Home Respiratory Rate (breaths/min): 20 Transportation: Private Auto Blood Pressure (mmHg): 126/74 Accompanied By: family member Schedule Follow-up Appointment: Yes Clinical Summary of Care: Electronic Signature(s) Signed: 04/15/2021 4:45:57 PM By: Deon Pilling RN, BSN Entered By: Deon Pilling on 04/15/2021 14:48:41 -------------------------------------------------------------------------------- Lower Extremity Assessment Details Patient Name: Date of Service: Kelli Hudson [facility] 04/15/2021 2:00 PM Medical Record Number: [id number] Patient Account Number: [id number] Date of Birth/Sex: Treating RN: 1990-08-11 (31 y.o. Kelli Hudson Primary Care Deanglo Hissong: Arlester Marker Other Clinician: Referring Trigg Delarocha: Treating Stanislaw Acton/Extender: Fermin Schwab in Treatment: 8 Edema Assessment Assessed: [name]: No] [Right: No] Edema: [Left: N] [Right: o] Calf Left: Right: Point of Measurement: 31 cm From Medial Instep 37 cm Ankle Left: Right: Point of Measurement: 10 cm From Medial Instep 23.8 cm Vascular Assessment Pulses: Dorsalis Pedis Palpable: [Left:Yes] Electronic Signature(s) Signed: 04/15/2021 4:54:19 PM By: Baruch Gouty RN, BSN Entered By: Baruch Gouty on 04/15/2021 14:23:04 -------------------------------------------------------------------------------- Multi Wound Chart Details Patient Name: Date of Service: Kelli Hudson [facility] 04/15/2021 2:00 PM Medical Record Number: [id number] Patient Account Number: [id number] Date of Birth/Sex: Treating RN: 1991/02/03 (31 y.o. F) Primary Care Arsal Tappan: Arlester Marker Other Clinician: Referring Chelse Matas: Treating Sanaia Jasso/Extender: Fermin Schwab in Treatment: 8 Vital Signs Height(in): 68 Capillary Blood Glucose(mg/dl):  154 Weight(lbs): 183 Pulse(bpm): 108 Body Mass Index(BMI): 27.8 Blood Pressure(mmHg): 126/74 Temperature(F): 98.6 Respiratory Rate(breaths/min): 18 Photos: [1:Left, Dorsal Foot] [2:Left, Lateral Foot] [N/A:N/A N/A] Wound Location: [1:Gradually Appeared] [2:Blister] [N/A:N/A] Wounding Event: [1:Diabetic Wound/Ulcer of the Lower] [2:Diabetic Wound/Ulcer of the Lower] [N/A:N/A] Primary Etiology: [1:Extremity Anemia, Type I Diabetes, Neuropathy Anemia, Type I Diabetes, Neuropathy N/A] [2:Extremity] Comorbid History: [1:01/04/2021] [2:01/04/2021] [N/A:N/A] Date Acquired: [1:8] [2:8] [N/A:N/A] Weeks of Treatment: [1:Open] [2:Open] [N/A:N/A] Wound Status: [1:No] [2:No] [N/A:N/A] Wound Recurrence: [1:0.5x0.5x0.1] [2:1.5x1.5x0.1] [N/A:N/A] Measurements L x W x D (cm) [1:0.196] [2:1.767] [N/A:N/A] A (cm) : rea [1:0.02] [  2:0.177] [N/A:N/A] Volume (cm) : [1:99.20%] [2:82.40%] [N/A:N/A] % Reduction in A [1:rea: 99.20%] [2:82.40%] [N/A:N/A] % Reduction in Volume: [1:Grade 1] [2:Grade 1] [N/A:N/A] Classification: [1:Medium] [2:Medium] [N/A:N/A] Exudate A mount: [1:Serosanguineous] [2:Serosanguineous] [N/A:N/A] Exudate Type: [1:red, brown] [2:red, brown] [N/A:N/A] Exudate Color: [1:Distinct, outline attached] [2:Distinct, outline attached] [N/A:N/A] Wound Margin: [1:Medium (34-66%)] [2:Large (67-100%)] [N/A:N/A] Granulation A mount: [1:Red] [2:Red] [N/A:N/A] Granulation Quality: [1:Medium (34-66%)] [2:Small (1-33%)] [N/A:N/A] Necrotic A mount: [1:Fat Layer (Subcutaneous Tissue): Yes Fat Layer (Subcutaneous Tissue): Yes N/A] Exposed Structures: [1:Fascia: No Tendon: No Muscle: No Joint: No Bone: No Medium (34-66%)] [2:Fascia: No Tendon: No Muscle: No Joint: No Bone: No Medium (34-66%)] [N/A:N/A] Epithelialization: [1:N/A] [2:Debridement - Excisional] [N/A:N/A] Debridement: Pre-procedure Verification/Time Out N/A [2:14:40] [N/A:N/A] Taken: [1:N/A] [2:Lidocaine 4% Topical Solution]  [N/A:N/A] Pain Control: [1:N/A] [2:Subcutaneous, Slough] [N/A:N/A] Tissue Debrided: [1:N/A] [2:Skin/Subcutaneous Tissue] [N/A:N/A] Level: [1:N/A] [2:2.25] [N/A:N/A] Debridement A (sq cm): [1:rea N/A] [2:Curette] [N/A:N/A] Instrument: [1:N/A] [2:Minimum] [N/A:N/A] Bleeding: [1:N/A] [2:Pressure] [N/A:N/A] Hemostasis A chieved: [1:N/A] [2:0] [N/A:N/A] Procedural Pain: [1:N/A] [2:0] [N/A:N/A] Post Procedural Pain: [1:N/A] [2:Procedure was tolerated well] [N/A:N/A] Debridement Treatment Response: [1:N/A] [2:1.5x1.5x0.1] [N/A:N/A] Post Debridement Measurements L x W x D (cm) [1:N/A] [2:0.177] [N/A:N/A] Post Debridement Volume: (cm) [1:N/A] [2:Debridement] [N/A:N/A] Treatment Notes Wound #1 (Foot) Wound Laterality: Dorsal, Left Cleanser Soap and Water Discharge Instruction: May shower and wash wound with dial antibacterial soap and water prior to dressing change. Peri-Wound Care Sween Lotion (Moisturizing lotion) Discharge Instruction: Apply moisturizing lotion as directed Topical Vaseline Discharge Instruction: Apply Vaseline to scabbed area. Primary Dressing Hydrofera Blue Ready Foam, 4x5 in Discharge Instruction: Apply over Santyl Secondary Dressing Woven Gauze Sponge, Non-Sterile 4x4 in Discharge Instruction: Apply over primary dressing as directed. Secured With [employer], 4.5x3.1 (in/yd) Discharge Instruction: Secure with Kerlix as directed. 69M Medipore H Soft Cloth Surgical T ape, 4 x 10 (in/yd) Discharge Instruction: Secure with tape as directed. Compression Wrap Compression Stockings Add-Ons Wound #2 (Foot) Wound Laterality: Left, Lateral Cleanser Soap and Water Discharge Instruction: May shower and wash wound with dial antibacterial soap and water prior to dressing change. Peri-Wound Care Sween Lotion (Moisturizing lotion) Discharge Instruction: Apply moisturizing lotion as directed Topical Primary Dressing Hydrofera Blue Ready Foam, 4x5 in Discharge  Instruction: Apply to wound bed as instructed Secondary Dressing Woven Gauze Sponge, Non-Sterile 4x4 in Discharge Instruction: Apply over primary dressing as directed. Secured With [employer], 4.5x3.1 (in/yd) Discharge Instruction: Secure with Kerlix as directed. 69M Medipore H Soft Cloth Surgical T ape, 4 x 10 (in/yd) Discharge Instruction: Secure with tape as directed. Compression Wrap Compression Stockings Add-Ons Electronic Signature(s) Signed: 04/15/2021 2:57:01 PM By: Kalman Shan DO Entered By: Kalman Shan on 04/15/2021 14:51:52 -------------------------------------------------------------------------------- Multi-Disciplinary Care Plan Details Patient Name: Date of Service: Kelli Hudson [facility] 04/15/2021 2:00 PM Medical Record Number: [id number] Patient Account Number: [id number] Date of Birth/Sex: Treating RN: [birth year] (31 y.o. Kelli Hudson Primary Care Abbegale Stehle: Arlester Marker Other Clinician: Referring Page Pucciarelli: Treating Kery Haltiwanger/Extender: Fermin Schwab in Treatment: 8 Active Inactive Nutrition Nursing Diagnoses: Impaired glucose control: actual or potential Goals: Patient/caregiver verbalizes understanding of need to maintain therapeutic glucose control per primary care physician Date Initiated: 02/14/2021 Target Resolution Date: 05/03/2021 Goal Status: Active Interventions: Assess HgA1c results as ordered upon admission and as needed Provide education on elevated blood sugars and impact on wound healing Notes: Wound/Skin Impairment Nursing Diagnoses: Impaired tissue integrity Goals: Patient/caregiver will verbalize understanding of skin care regimen Date Initiated: 02/14/2021 Target Resolution Date: 05/10/2021 Goal  Status: Active Ulcer/skin breakdown will have a volume reduction of 30% by week 4 Date Initiated: 02/14/2021 Date Inactivated: 03/25/2021 Target Resolution Date: 03/25/2021 Goal Status:  Met Interventions: Assess patient/caregiver ability to obtain necessary supplies Assess patient/caregiver ability to perform ulcer/skin care regimen upon admission and as needed Assess ulceration(s) every visit Provide education on ulcer and skin care Treatment Activities: Topical wound management initiated : 02/14/2021 Notes: Electronic Signature(s) Signed: 04/15/2021 4:45:57 PM By: Deon Pilling RN, BSN Entered By: Deon Pilling on 04/15/2021 14:36:38 -------------------------------------------------------------------------------- Pain Assessment Details Patient Name: Date of Service: Kelli Hudson [facility] 04/15/2021 2:00 PM Medical Record Number: [id number] Patient Account Number: [id number] Date of Birth/Sex: Treating RN: 1990-08-15 (31 y.o. Kelli Hudson Primary Care Aubreyanna Dorrough: Arlester Marker Other Clinician: Referring Hiro Vipond: Treating Tyresse Jayson/Extender: Fermin Schwab in Treatment: 8 Active Problems Location of Pain Severity and Description of Pain Patient Has Paino No Site Locations Pain Management and Medication Current Pain Management: Electronic Signature(s) Signed: 04/15/2021 4:54:19 PM By: Baruch Gouty RN, BSN Entered By: Baruch Gouty on 04/15/2021 14:20:01 -------------------------------------------------------------------------------- Patient/Caregiver Education Details Patient Name: Date of Service: Kelli Hudson [facility] 2/27/2023andnbsp2:00 PM Medical Record Number: [id number] Patient Account Number: [id number] Date of Birth/Gender: Treating RN: Oct 10, 1990 (31 y.o. Debby Bud Primary Care Physician: Arlester Marker Other Clinician: Referring Physician: Treating Physician/Extender: Fermin Schwab in Treatment: 8 Education Assessment Education Provided To: Patient Education Topics Provided Elevated Blood Sugar/ Impact on Healing: Handouts: Elevated Blood Sugars: How Do They Affect  Wound Healing Methods: Explain/Verbal Responses: Reinforcements needed Electronic Signature(s) Signed: 04/15/2021 4:45:57 PM By: Deon Pilling RN, BSN Entered By: Deon Pilling on 04/15/2021 14:37:56 -------------------------------------------------------------------------------- Wound Assessment Details Patient Name: Date of Service: Kelli Hudson [facility] 04/15/2021 2:00 PM Medical Record Number: [id number] Patient Account Number: [id number] Date of Birth/Sex: Treating RN: 02-05-1991 (31 y.o. Kelli Hudson Primary Care Eliga Arvie: Arlester Marker Other Clinician: Referring Anadia Helmes: Treating Sylvanus Telford/Extender: Fermin Schwab in Treatment: 8 Wound Status Wound Number: 1 Primary Etiology: Diabetic Wound/Ulcer of the Lower Extremity Wound Location: Left, Dorsal Foot Wound Status: Open Wounding Event: Gradually Appeared Comorbid History: Anemia, Type I Diabetes, Neuropathy Date Acquired: 01/04/2021 Weeks Of Treatment: 8 Clustered Wound: No Photos Wound Measurements Length: (cm) 0.5 Width: (cm) 0.5 Depth: (cm) 0.1 Area: (cm) 0.196 Volume: (cm) 0.02 % Reduction in Area: 99.2% % Reduction in Volume: 99.2% Epithelialization: Medium (34-66%) Tunneling: No Undermining: No Wound Description Classification: Grade 1 Wound Margin: Distinct, outline attached Exudate Amount: Medium Exudate Type: Serosanguineous Exudate Color: red, brown Foul Odor After Cleansing: No Slough/Fibrino Yes Wound Bed Granulation Amount: Medium (34-66%) Exposed Structure Granulation Quality: Red Fascia Exposed: No Necrotic Amount: Medium (34-66%) Fat Layer (Subcutaneous Tissue) Exposed: Yes Necrotic Quality: Adherent Slough Tendon Exposed: No Muscle Exposed: No Joint Exposed: No Bone Exposed: No Treatment Notes Wound #1 (Foot) Wound Laterality: Dorsal, Left Cleanser Soap and Water Discharge Instruction: May shower and wash wound with dial antibacterial soap and  water prior to dressing change. Peri-Wound Care Sween Lotion (Moisturizing lotion) Discharge Instruction: Apply moisturizing lotion as directed Topical Vaseline Discharge Instruction: Apply Vaseline to scabbed area. Primary Dressing Hydrofera Blue Ready Foam, 4x5 in Discharge Instruction: Apply over Santyl Secondary Dressing Woven Gauze Sponge, Non-Sterile 4x4 in Discharge Instruction: Apply over primary dressing as directed. Secured With [employer], 4.5x3.1 (in/yd) Discharge Instruction: Secure with Kerlix as directed. 53M Medipore H Soft Cloth Surgical T ape, 4 x 10 (in/yd) Discharge  Instruction: Secure with tape as directed. Compression Wrap Compression Stockings Add-Ons Electronic Signature(s) Signed: 04/15/2021 3:49:30 PM By: Sharyn Creamer RN, BSN Signed: 04/15/2021 4:54:19 PM By: Baruch Gouty RN, BSN Entered By: Sharyn Creamer on 04/15/2021 14:29:31 -------------------------------------------------------------------------------- Wound Assessment Details Patient Name: Date of Service: Kelli Hudson [facility] 04/15/2021 2:00 PM Medical Record Number: [id number] Patient Account Number: [id number] Date of Birth/Sex: Treating RN: 27-Jul-1990 (31 y.o. Kelli Hudson Primary Care Keoshia Steinmetz: Arlester Marker Other Clinician: Referring Nova Evett: Treating Clavin Ruhlman/Extender: Fermin Schwab in Treatment: 8 Wound Status Wound Number: 2 Primary Etiology: Diabetic Wound/Ulcer of the Lower Extremity Wound Location: Left, Lateral Foot Wound Status: Open Wounding Event: Blister Comorbid History: Anemia, Type I Diabetes, Neuropathy Date Acquired: 01/04/2021 Weeks Of Treatment: 8 Clustered Wound: No Photos Wound Measurements Length: (cm) 1.5 Width: (cm) 1.5 Depth: (cm) 0.1 Area: (cm) 1.767 Volume: (cm) 0.177 % Reduction in Area: 82.4% % Reduction in Volume: 82.4% Epithelialization: Medium (34-66%) Tunneling: No Undermining: No Wound  Description Classification: Grade 1 Wound Margin: Distinct, outline attached Exudate Amount: Medium Exudate Type: Serosanguineous Exudate Color: red, brown Foul Odor After Cleansing: No Slough/Fibrino Yes Wound Bed Granulation Amount: Large (67-100%) Exposed Structure Granulation Quality: Red Fascia Exposed: No Necrotic Amount: Small (1-33%) Fat Layer (Subcutaneous Tissue) Exposed: Yes Necrotic Quality: Adherent Slough Tendon Exposed: No Muscle Exposed: No Joint Exposed: No Bone Exposed: No Treatment Notes Wound #2 (Foot) Wound Laterality: Left, Lateral Cleanser Soap and Water Discharge Instruction: May shower and wash wound with dial antibacterial soap and water prior to dressing change. Peri-Wound Care Sween Lotion (Moisturizing lotion) Discharge Instruction: Apply moisturizing lotion as directed Topical Primary Dressing Hydrofera Blue Ready Foam, 4x5 in Discharge Instruction: Apply to wound bed as instructed Secondary Dressing Woven Gauze Sponge, Non-Sterile 4x4 in Discharge Instruction: Apply over primary dressing as directed. Secured With [employer], 4.5x3.1 (in/yd) Discharge Instruction: Secure with Kerlix as directed. 61M Medipore H Soft Cloth Surgical T ape, 4 x 10 (in/yd) Discharge Instruction: Secure with tape as directed. Compression Wrap Compression Stockings Add-Ons Electronic Signature(s) Signed: 04/15/2021 3:49:30 PM By: Sharyn Creamer RN, BSN Signed: 04/15/2021 4:54:19 PM By: Baruch Gouty RN, BSN Entered By: Sharyn Creamer on 04/15/2021 14:30:20 -------------------------------------------------------------------------------- Vitals Details Patient Name: Date of Service: Kelli Hudson [facility] 04/15/2021 2:00 PM Medical Record Number: [id number] Patient Account Number: [id number] Date of Birth/Sex: Treating RN: 10-21-1990 (30 y.o. Kelli Hudson Primary Care Azya Barbero: Arlester Marker Other Clinician: Referring Gunner Iodice: Treating  Makhya Arave/Extender: Fermin Schwab in Treatment: 8 Vital Signs Time Taken: 14:18 Temperature (F): 98.6 Height (in): 68 Pulse (bpm): 108 Weight (lbs): 183 Respiratory Rate (breaths/min): 18 Body Mass Index (BMI): 27.8 Blood Pressure (mmHg): 126/74 Capillary Blood Glucose (mg/dl): 154 Reference Range: 80 - 120 mg / dl Electronic Signature(s) Signed: 04/15/2021 4:54:19 PM By: Baruch Gouty RN, BSN Entered By: Baruch Gouty on 04/15/2021 14:19:53

## 2021-04-15 NOTE — Progress Notes (Unsigned)
Name: Kelli Hudson  MRN/ DOB: [id number], [birth year]   Age/ Sex: 31 y.o., female    PCP: [name], MD   Reason for Endocrinology Evaluation: Type 1 Diabetes Mellitus     Date of Initial Endocrinology Visit: 12/18/2020    PATIENT IDENTIFIER: Kelli Hudson is a 31 y.o. female with a past medical history of T1DM, and gastroparesis. The patient presented for initial endocrinology clinic visit on 12/18/2020 for consultative assistance with her diabetes management.     HPI: [name] was    Diagnosed with DM 2008 ( age 31) .Hemoglobin A1c has ranged from 9.7% in 2017, peaking at 13.9% in 2021.   Started insulin pump 01/2021  Sees Dr. Jacqualyn Posey podiatry   ON Nexplanon    SUBJECTIVE:   During the last visit (12/18/2020): A1c 9.8% adjusted MDI regimen   Today (04/15/21): Kelli Hudson is here for a follow up on diabetes management. She checks her  blood sugars *** times daily, preprandial to breakfast and ***. The patient has *** had hypoglycemic episodes since the last clinic visit, which typically occur *** x / - most often occuring ***. The patient is *** symptomatic with these episodes, with symptoms of {symptoms; hypoglycemia:9084048}.   She presented to the ED 02/2021 with hyperglycemia  serum glucose 557 mg/dL and DKA     This patient with type 1 diabetes is treated with *** (insulin pump). During the visit the pump basal and bolus doses were reviewed including carb/insulin rations and supplemental doses. The clinical list was updated. The glucose meter download was reviewed in detail to determine if the current pump settings are providing the best glycemic control without excessive hypoglycemia.  Pump and meter download:    Pump   ****  Settings   Insulin type   ***   Basal rate       0000-0200  0.5 u/h           I:C ratio       0000-0600  1:12          Sensitivity       0000  50      Goal       0000  70-110              Type & Model of Pump: *** Insulin Type: Currently using ***.  There is no height or weight on file to calculate BMI.  PUMP STATISTICS: Average BG: *** +/- *** BG Readings: *** (*** / day) Average Daily Carbs (g): *** +/- *** Average Total Daily Insulin: *** +/- *** Average Daily Basal: *** (*** %) Average Daily Bolus: *** (*** %) Total Daily Dose in Units / KG: ***       HOME DIABETES REGIMEN: Lantus 16 units QHS  Novolin-R ( Vial) 1: 12 Correction Factor: Regular insulin (BG-130/50)     Statin: no ACE-I/ARB: yes Prior Diabetic Education: yes    CONTINUOUS GLUCOSE MONITORING RECORD INTERPRETATION    Dates of Recording: 10/19-11/02/2020  Sensor description:dexcom  Results statistics:   CGM use % of time 29  Average and SD 281/81  Time in range      14  %  % Time Above 180 20  % Time above 250 66  % Time Below target 0    Glycemic patterns summary: Hyperglycemia during the day and night   Hyperglycemic episodes  all day and night   Hypoglycemic episodes occurred following a bolus   Overnight periods: high  DIABETIC COMPLICATIONS: Microvascular complications:  Gastroparesis , neuropathy , has DR ( Received injections B/L).  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 pylori gastric ulcer    Diabetes mellitus ([place])    Diabetic gastroparesis ([place])    Gastroparesis    GERD (gastroesophageal reflux disease)    Hypertension    Hyperthyroidism    Past Surgical History:  Past Surgical History:  Procedure Laterality Date   AMPUTATION TOE Left 03/10/2018   Procedure: AMPUTATION FIFTH TOE;  Surgeon: Trula Slade, DPM;  Location: [facility];  Service: Podiatry;  Laterality: Left;   BIOPSY  01/28/2020   Procedure: BIOPSY;  Surgeon: Otis Brace,  MD;  Location: [facility];  Service: Gastroenterology;;   ESOPHAGOGASTRODUODENOSCOPY N/A 01/28/2020   Procedure: ESOPHAGOGASTRODUODENOSCOPY (EGD);  Surgeon: Otis Brace, MD;  Location: [facility];  Service: Gastroenterology;  Laterality: N/A;   ESOPHAGOGASTRODUODENOSCOPY (EGD) WITH PROPOFOL Left 09/08/2015   Procedure: ESOPHAGOGASTRODUODENOSCOPY (EGD) WITH PROPOFOL;  Surgeon: Arta Silence, MD;  Location: [facility];  Service: Endoscopy;  Laterality: Left;   WISDOM TOOTH EXTRACTION      Social History:  reports that she has never smoked. She has never used smokeless tobacco. She reports current alcohol use of about 1.0 standard drink per week. She reports that she does not use drugs. Family History:  Family History  Problem Relation Age of Onset   Lung cancer Mother    Diabetes Mother    Bipolar disorder Father    Liver cancer Maternal Grandfather    Breast cancer Other        maternal great aunt   Heart disease Other    Ovarian cancer Other        maternal great aunt   Pancreatic cancer Maternal Uncle    Prostate cancer Maternal Uncle    Diabetes Maternal Aunt        x 2   Kidney disease Other        maternal great aunt     HOME MEDICATIONS: Allergies as of 04/15/2021   No Known Allergies      Medication List        Accurate as of April 15, 2021 12:36 PM. If you have any questions, ask your nurse or doctor.          albuterol 108 (90 Base) MCG/ACT inhaler Commonly known as: VENTOLIN HFA Inhale 2 puffs into the lungs every 6 (six) hours as needed for shortness of breath.   atorvastatin 20 MG tablet Commonly known as: LIPITOR Take 1 tablet (20 mg total) by mouth daily.   calcium gluconate 500 MG tablet Take 1 tablet (500 mg total) by mouth 3 (three) times daily.   Dexcom G6 Receiver Devi USE AS DIRECTED What changed:  how much to take how to take this when to take this   Dexcom G6 Sensor Misc 1 each by Does not apply route daily.   Dexcom  G6 Transmitter Misc 1 each by Does not apply route daily.   famotidine 20 MG tablet Commonly known as: PEPCID TAKE 1 TABLET(20 MG) BY MOUTH TWICE DAILY   hyoscyamine 0.375 MG 12 hr tablet Commonly known as: LEVBID Take 1 tablet (0.375 mg total) by mouth every 12 (twelve) hours as needed for cramping (abdominal pain).   insulin glargine 100 UNIT/ML injection Commonly known as: LANTUS  Inject 0.18 mLs (18 Units total) into the skin daily.   Insulin Pen Needle 31G X 5 MM Misc 1 Device by Does not apply route QID. For use with insulin pens   insulin regular 100 units/mL injection Commonly known as: NovoLIN R Inject 0-0.15 mLs (0-15 Units total) into the skin 3 (three) times daily before meals.   lisinopril 10 MG tablet Commonly known as: ZESTRIL Take 1 tablet (10 mg total) by mouth daily.   metoCLOPramide 10 MG tablet Commonly known as: Reglan Take 1 tablet (10 mg total) by mouth 3 (three) times daily before meals.   pantoprazole 40 MG tablet Commonly known as: PROTONIX Take 40 mg by mouth daily.   promethazine 12.5 MG tablet Commonly known as: PHENERGAN Take 1 tablet (12.5 mg total) by mouth every 6 (six) hours as needed for nausea or vomiting.   Santyl ointment Generic drug: collagenase Apply 1 application topically daily.   True Metrix Blood Glucose Test test strip Generic drug: glucose blood Use as instructed   True Metrix Meter w/Device Kit 1 Device by Does not apply route 4 (four) times daily.   TRUEplus Lancets 28G Misc assist with checking blood sugar TID and qhs What changed:  how much to take how to take this when to take this         ALLERGIES: No Known Allergies   REVIEW OF SYSTEMS: A comprehensive ROS was conducted with the patient and is negative except as per HPI     OBJECTIVE:   VITAL SIGNS: There were no vitals taken for this visit.   PHYSICAL EXAM:  General: Pt appears well and is in NAD  Neck: General: Supple without adenopathy  or carotid bruits. Thyroid: Thyroid size normal.  No goiter or nodules appreciated.  Lungs: Clear with good BS bilat with no rales, rhonchi, or wheezes  Heart: RRR with normal S1 and S2 and no gallops; no murmurs; no rub  Abdomen: Normoactive bowel sounds, soft, nontender, without masses or organomegaly palpable  Extremities:  Lower extremities - No pretibial edema. No lesions.  Neuro: MS is good with appropriate affect, pt is alert and Ox3    DATA REVIEWED:  Lab Results  Component Value Date   HGBA1C 9.9 (H) 02/25/2021   HGBA1C 9.8 (H) 11/12/2020   HGBA1C 10.5 (H) 09/09/2020   Lab Results  Component Value Date   MICROALBUR 48.4 (H) 05/22/2020   LDLCALC 132 (H) 05/22/2020   CREATININE 1.00 02/28/2021   Lab Results  Component Value Date   MICRALBCREAT 66.9 (H) 05/22/2020    Lab Results  Component Value Date   CHOL 231 (H) 05/22/2020   HDL 76.30 05/22/2020   LDLCALC 132 (H) 05/22/2020   LDLDIRECT 83.0 07/05/2019   TRIG 113.0 05/22/2020   CHOLHDL 3 05/22/2020        ASSESSMENT / PLAN / RECOMMENDATIONS:   1) Type 1 Diabetes Mellitus, Poorly controlled, With retinopathic, neuropathic complications and gastroparesis  - Most recent A1c of 9.8 %. Goal A1c < 7.0 %.     I have discussed with the patient the pathophysiology of diabetes. We went over the natural progression of the disease. We stressed the importance of lifestyle changes including diet and exercise. I explained the complications associated with diabetes including retinopathy, nephropathy, neuropathy as well as increased risk of cardiovascular disease. We went over the benefit seen with glycemic control.   I explained to the patient that diabetic patients are at higher than normal risk for amputations.  Unfortunately  she already has microvascular complications, we discussed it is still important to slow down any further damage She is on Novolin-R vials, I offered to switch to pens but she just picked up multiple and  would like to use first  She is interested in insulin pumps , she is changing insurance, and will order once the new insurance kicks in.  She meets with [name]  Due to gastroparesis she takes insulin post-prandial, it will be difficult to reach optimal glycemic control  Will adjust insulin as below   MEDICATIONS: - Lantus 16 units daily  - Novolin - R 1 units for every 12 grams of carbohydrates  Correction Factor: Regular insulin (BG-130/50)   EDUCATION / INSTRUCTIONS: BG monitoring instructions: Patient is instructed to check her blood sugars 3 times a day, before meals . Call [facility] if: BG persistently < 70  I reviewed the Rule of 15 for the treatment of hypoglycemia in detail with the patient. Literature supplied.   2) Diabetic complications:  Eye: Does  have known diabetic retinopathy.  Neuro/ Feet: Does  have known diabetic peripheral neuropathy. Renal: Patient does not have known baseline CKD. She is  on an ACEI/ARB at present   3) Lipids: LDL is acceptable. NO indication for statin therapy until age 71-40     F/U in 3 months    Signed electronically by: Mack Guise, MD  [facility] End[facility]  [facility] [address] [place], [place] [address] Phone: [phone] FAX: [phone]   CC: [name], [place] [address] Phone: [phone]  Fax: [phone]    Return to Endocrinology clinic as below: Future Appointments  Date Time Provider [place]  04/15/2021  2:00 PM Valinda Party, DO [facility]  04/15/2021  3:00 PM Iran Rowe, Melanie Crazier, MD [facility] None  04/17/2021  8:00 AM [facility]  05/22/2021 10:00 AM [facility]  08/12/2021  2:00 PM Rudd, Lillette Boxer, MD [facility]

## 2021-04-17 ENCOUNTER — Other Ambulatory Visit (INDEPENDENT_AMBULATORY_CARE_PROVIDER_SITE_OTHER): Payer: 59

## 2021-04-17 ENCOUNTER — Encounter: Payer: Self-pay | Admitting: Podiatry

## 2021-04-17 ENCOUNTER — Other Ambulatory Visit: Payer: Self-pay

## 2021-04-17 ENCOUNTER — Encounter: Payer: Self-pay | Admitting: Family Medicine

## 2021-04-17 DIAGNOSIS — E785 Hyperlipidemia, unspecified: Secondary | ICD-10-CM

## 2021-04-17 DIAGNOSIS — Z89422 Acquired absence of other left toe(s): Secondary | ICD-10-CM | POA: Diagnosis not present

## 2021-04-17 DIAGNOSIS — E1065 Type 1 diabetes mellitus with hyperglycemia: Secondary | ICD-10-CM

## 2021-04-17 LAB — LIPID PANEL
Cholesterol: 224 mg/dL — ABNORMAL HIGH (ref 0–200)
HDL: 55.3 mg/dL (ref >39.00)
LDL Cholesterol: 140 mg/dL — ABNORMAL HIGH (ref 0–99)
NonHDL: 168.93
Total CHOL/HDL Ratio: 4
Triglycerides: 145 mg/dL (ref 0.0–149.0)
VLDL: 29 mg/dL (ref 0.0–40.0)

## 2021-04-17 LAB — CBC WITH DIFFERENTIAL/PLATELET
Basophils Absolute: 0 10*3/uL (ref 0.0–0.1)
Basophils Relative: 0.6 % (ref 0.0–3.0)
Eosinophils Absolute: 0.2 10*3/uL (ref 0.0–0.7)
Eosinophils Relative: 2.5 % (ref 0.0–5.0)
HCT: 31.4 % — ABNORMAL LOW (ref 36.0–46.0)
Hemoglobin: 10.4 g/dL — ABNORMAL LOW (ref 12.0–15.0)
Lymphocytes Relative: 54 % — ABNORMAL HIGH (ref 12.0–46.0)
Lymphs Abs: 3.9 10*3/uL (ref 0.7–4.0)
MCHC: 33 g/dL (ref 30.0–36.0)
MCV: 87.5 fl (ref 78.0–100.0)
Monocytes Absolute: 0.4 10*3/uL (ref 0.1–1.0)
Monocytes Relative: 5.9 % (ref 3.0–12.0)
Neutro Abs: 2.7 10*3/uL (ref 1.4–7.7)
Neutrophils Relative %: 37 % — ABNORMAL LOW (ref 43.0–77.0)
Platelets: 388 10*3/uL (ref 150.0–400.0)
RBC: 3.59 Mil/uL — ABNORMAL LOW (ref 3.87–5.11)
RDW: 14.3 % (ref 11.5–15.5)
WBC: 7.2 10*3/uL (ref 4.0–10.5)

## 2021-04-17 LAB — SEDIMENTATION RATE: Sed Rate: 80 mm/hr — ABNORMAL HIGH (ref 0–20)

## 2021-04-17 LAB — C-REACTIVE PROTEIN: CRP: <1 mg/dL (ref 0.5–20.0)

## 2021-04-17 LAB — GLUCOSE, FASTING: Glucose, Bld: 154 mg/dL — ABNORMAL HIGH (ref 65–99)

## 2021-04-18 ENCOUNTER — Encounter: Payer: Self-pay | Admitting: Internal Medicine

## 2021-04-18 ENCOUNTER — Ambulatory Visit (INDEPENDENT_AMBULATORY_CARE_PROVIDER_SITE_OTHER): Payer: 59

## 2021-04-18 ENCOUNTER — Ambulatory Visit (INDEPENDENT_AMBULATORY_CARE_PROVIDER_SITE_OTHER): Payer: 59 | Admitting: Internal Medicine

## 2021-04-18 ENCOUNTER — Ambulatory Visit (INDEPENDENT_AMBULATORY_CARE_PROVIDER_SITE_OTHER): Payer: 59 | Admitting: Podiatry

## 2021-04-18 ENCOUNTER — Other Ambulatory Visit: Payer: Self-pay

## 2021-04-18 VITALS — BP 124/70 | HR 100 | Ht 68.0 in | Wt 195.4 lb

## 2021-04-18 DIAGNOSIS — M7989 Other specified soft tissue disorders: Secondary | ICD-10-CM | POA: Diagnosis not present

## 2021-04-18 DIAGNOSIS — Z9889 Other specified postprocedural states: Secondary | ICD-10-CM

## 2021-04-18 DIAGNOSIS — E1065 Type 1 diabetes mellitus with hyperglycemia: Secondary | ICD-10-CM

## 2021-04-18 DIAGNOSIS — E042 Nontoxic multinodular goiter: Secondary | ICD-10-CM

## 2021-04-18 DIAGNOSIS — L97522 Non-pressure chronic ulcer of other part of left foot with fat layer exposed: Secondary | ICD-10-CM

## 2021-04-18 LAB — C-PEPTIDE: C-Peptide: 0.49 ng/mL — ABNORMAL LOW (ref 0.80–3.85)

## 2021-04-18 MED ORDER — AMOXICILLIN-POT CLAVULANATE 875-125 MG PO TABS: 1.0000 | ORAL_TABLET | Freq: Two times a day (BID) | ORAL | 0 refills | Status: DC

## 2021-04-18 NOTE — Telephone Encounter (Signed)
Scheduled today at 3:15

## 2021-04-18 NOTE — Progress Notes (Signed)
Name: Kelli Hudson  MRN/ DOB: [id number], Jun 30, 1990   Age/ Sex: 31 y.o., female    PCP: [name], MD   Reason for Endocrinology Evaluation: Type 1 Diabetes Mellitus     Date of Initial Endocrinology Visit: 12/18/2020    PATIENT IDENTIFIER: Kelli Hudson is a 31 y.o. female with a past medical history of T1DM, and gastroparesis. The patient presented for initial endocrinology clinic visit on 12/18/2020 for consultative assistance with her diabetes management.     HPI: Kelli Hudson was   Diagnosed with DM 2008 ( age 16) .Hemoglobin A1c has ranged from 9.7% in 2017, peaking at 13.9% in 2021.  Started insulin pump 01/2021  Sees Dr. Jacqualyn Posey podiatry   ON Nexplanon    THYROID HISTORY: Patient has been noted with thyromegaly during physical exam, which prompted a thyroid ultrasound on 03/15/2020 revealing multinodular goiter.  Two of the nodules met criteria for FNA.  Patient was lost to follow-up.  Repeat FNA has been ordered 03/2021  SUBJECTIVE:   During the last visit (12/18/2020): A1c 9.8% adjusted MDI regimen   Today (04/18/21): Kelli Hudson is here for a follow up on diabetes management. She is accompanied by her friend Dian Situ. She checks her blood sugars multiple  times daily through CGM. The patient has not  had hypoglycemic episodes since the last clinic visit.  She presented to the ED 02/2021 with hyperglycemia  serum glucose 557 mg/dL and DKA   Continues to follow with the wound clinic  Denies nausea, vomiting or diarrhea   This patient with type 1 diabetes is treated with Tandem (insulin pump). During the visit the pump basal and bolus doses were reviewed including carb/insulin rations and supplemental doses. The clinical list was updated. The glucose meter download was reviewed in detail to determine if the current pump settings are providing the best glycemic control without excessive hypoglycemia.  Pump and meter  download:    Pump   Tandem  Settings   Insulin type   Novolin-R    Basal rate       0000 0.5 u/h           I:C ratio       0000-0600  1:12          Sensitivity       0000  50      Goal       0000  70-110             Type & Model of Pump: Tandem Insulin Type: Currently using Novolin-R.  Body mass index is 29.71 kg/m.  PUMP STATISTICS: Average BG: 233  BG Readings: 225  / day Average Daily Carbs (g): 33  Average Total Daily Insulin: 30.19 Average Daily Basal: 17 (58 %) Average Daily Bolus: 12 (42 %)    CONTINUOUS GLUCOSE MONITORING RECORD INTERPRETATION    Dates of Recording: 2/17-04/18/2021  Sensor description: dexcom  Results statistics:   CGM use % of time 93  Average and SD 218/77  Time in range    40 %  % Time Above 180 25  % Time above 250 34  % Time Below target <      Glycemic patterns summary: BG's optimal at night, hyperglycemia noted during the day   Hyperglycemic episodes  postprandial  Hypoglycemic episodes occurred n/a  Overnight periods: at goal      HOME DIABETES REGIMEN: Novolin-R    Statin: no ACE-I/ARB: yes Prior Diabetic Education: yes    CONTINUOUS GLUCOSE  MONITORING RECORD INTERPRETATION    Dates of Recording: 10/19-11/02/2020  Sensor description:dexcom  Results statistics:   CGM use % of time 29  Average and SD 281/81  Time in range      14  %  % Time Above 180 20  % Time above 250 66  % Time Below target 0    Glycemic patterns summary: Hyperglycemia during the day and night   Hyperglycemic episodes  all day and night   Hypoglycemic episodes occurred following a bolus   Overnight periods: high        DIABETIC COMPLICATIONS: Microvascular complications:  Gastroparesis , neuropathy , has DR ( Received injections B/L).  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 pylori gastric ulcer    Diabetes mellitus ([place])    Diabetic gastroparesis ([place])    Gastroparesis    GERD (gastroesophageal reflux disease)    Hypertension    Hyperthyroidism    Past Surgical History:  Past Surgical History:  Procedure Laterality Date   AMPUTATION TOE Left 03/10/2018   Procedure: AMPUTATION FIFTH TOE;  Surgeon: Trula Slade, DPM;  Location: [facility];  Service: Podiatry;  Laterality: Left;   BIOPSY  01/28/2020   Procedure: BIOPSY;  Surgeon: Otis Brace, MD;  Location: [facility];  Service: Gastroenterology;;   ESOPHAGOGASTRODUODENOSCOPY N/A 01/28/2020   Procedure: ESOPHAGOGASTRODUODENOSCOPY (EGD);  Surgeon: Otis Brace, MD;  Location: [facility];  Service: Gastroenterology;  Laterality: N/A;   ESOPHAGOGASTRODUODENOSCOPY (EGD) WITH PROPOFOL Left 09/08/2015   Procedure: ESOPHAGOGASTRODUODENOSCOPY (EGD) WITH PROPOFOL;  Surgeon: Arta Silence, MD;  [facility];  Service: Endoscopy;  Laterality: Left;   WISDOM TOOTH EXTRACTION      Social History:  reports that she has never smoked. She has never used smokeless tobacco. She reports current alcohol use of about 1.0 standard drink per week. She reports that she does not use drugs. Family History:  Family History  Problem Relation Age of Onset   Lung cancer Mother    Diabetes Mother    Bipolar disorder Father    Liver cancer Maternal Grandfather    Breast cancer Other        maternal great aunt   Heart disease Other    Ovarian cancer Other        maternal great aunt   Pancreatic cancer Maternal Uncle    Prostate cancer Maternal Uncle    Diabetes Maternal Aunt        x 2   Kidney disease Other        maternal great aunt     HOME MEDICATIONS: Allergies as of 04/18/2021   No Known Allergies      Medication List        Accurate as of April 18, 2021  9:01 AM. If you have any questions, ask your nurse or doctor.           STOP taking these medications    insulin glargine 100 UNIT/ML injection Commonly known as: LANTUS Stopped by: Dorita Sciara, MD       TAKE these medications    albuterol 108 (90 Base) MCG/ACT inhaler Commonly known as: VENTOLIN HFA Inhale 2 puffs into the lungs every 6 (six) hours as needed for shortness of breath.   atorvastatin 20 MG tablet Commonly known as: LIPITOR Take 1 tablet (20  mg total) by mouth daily.   calcium gluconate 500 MG tablet Take 1 tablet (500 mg total) by mouth 3 (three) times daily.   Dexcom G6 Receiver Devi USE AS DIRECTED What changed:  how much to take how to take this when to take this   Dexcom G6 Sensor Misc 1 each by Does not apply route daily.   Dexcom G6 Transmitter Misc 1 each by Does not apply route daily.   famotidine 20 MG tablet Commonly known as: PEPCID TAKE 1 TABLET(20 MG) BY MOUTH TWICE DAILY   hyoscyamine 0.375 MG 12 hr tablet Commonly known as: LEVBID Take 1 tablet (0.375 mg total) by mouth every 12 (twelve) hours as needed for cramping (abdominal pain).   Insulin Pen Needle 31G X 5 MM Misc 1 Device by Does not apply route QID. For use with insulin pens   insulin regular 100 units/mL injection Commonly known as: NovoLIN R Inject 0-0.15 mLs (0-15 Units total) into the skin 3 (three) times daily before meals.   lisinopril 10 MG tablet Commonly known as: ZESTRIL Take 1 tablet (10 mg total) by mouth daily.   metoCLOPramide 10 MG tablet Commonly known as: Reglan Take 1 tablet (10 mg total) by mouth 3 (three) times daily before meals.   pantoprazole 40 MG tablet Commonly known as: PROTONIX Take 40 mg by mouth daily.   promethazine 12.5 MG tablet Commonly known as: PHENERGAN Take 1 tablet (12.5 mg total) by mouth every 6 (six) hours as needed for nausea or vomiting.   Santyl ointment Generic drug: collagenase Apply 1 application topically daily.   True Metrix Blood Glucose Test test strip Generic drug:  glucose blood Use as instructed   True Metrix Meter w/Device Kit 1 Device by Does not apply route 4 (four) times daily.   TRUEplus Lancets 28G Misc assist with checking blood sugar TID and qhs What changed:  how much to take how to take this when to take this         ALLERGIES: No Known Allergies   REVIEW OF SYSTEMS: A comprehensive ROS was conducted with the patient and is negative except as per HPI     OBJECTIVE:   VITAL SIGNS: BP 124/70 (BP Location: Left Arm, Patient Position: Sitting, Cuff Size: Small)    Pulse 100    Ht 5' 8"  (1.727 m)    Wt 195 lb 6.4 oz (88.6 kg)    SpO2 99%    BMI 29.71 kg/m    PHYSICAL EXAM:  General: Pt appears well and is in NAD  Neck: General: Supple without adenopathy or carotid bruits. Thyroid: Thyroid size normal.  Right nodule appreciated   Lungs: Clear with good BS bilat with no rales, rhonchi, or wheezes  Heart: RRR with normal S1 and S2 and no gallops; no murmurs; no rub  Abdomen: Normoactive bowel sounds, soft, nontender, without masses or organomegaly palpable  Extremities:  Lower extremities - No pretibial edema. No lesions.  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 Left dorsal foot skin ulceration noted  The pedal pulses are 2+ on right and 2+ on left. The sensation is decreased  to a screening 5.07, 10 gram monofilament bilaterally   DATA REVIEWED:  Lab Results  Component Value Date   HGBA1C 9.9 (H) 02/25/2021   HGBA1C 9.8 (H) 11/12/2020   HGBA1C 10.5 (H) 09/09/2020  Lab Results  Component Value Date   MICROALBUR 48.4 (H) 05/22/2020   LDLCALC 140 (H) 04/17/2021   CREATININE 1.00 02/28/2021   Lab Results  Component Value Date   MICRALBCREAT 66.9 (H) 05/22/2020    Lab Results  Component Value Date   CHOL 224 (H) 04/17/2021   HDL 55.30 04/17/2021   LDLCALC 140 (H) 04/17/2021    LDLDIRECT 83.0 07/05/2019   TRIG 145.0 04/17/2021   CHOLHDL 4 04/17/2021      Estimated total number of nodules >/= 1 cm: 2   Number of spongiform nodules >/=  2 cm not described below (TR1): 0   Number of mixed cystic and solid nodules >/= 1.5 cm not described below ([place]): 0   _________________________________________________________   Nodule # 1:   Location: Right; mid   Maximum size: 1.3 cm; Other 2 dimensions: 0.9 x 0.8 cm   Composition: solid/almost completely solid (2)   Echogenicity: hypoechoic (2)   Shape: taller-than-wide (3)   Margins: ill-defined (0)   Echogenic foci: punctate echogenic foci (3)   ACR TI-RADS total points: 10.   ACR TI-RADS risk category: TR5 (>/= 7 points).   ACR TI-RADS recommendations:   **Given size (>/= 1.0 cm) and appearance, fine needle aspiration of this highly suspicious nodule should be considered based on TI-RADS criteria.   _________________________________________________________   Nodule # 2:   Location: Right; inferior   Maximum size: 1.5 cm; Other 2 dimensions: 1.3 x 1.2 cm   Composition: solid/almost completely solid (2)   Echogenicity: hypoechoic (2)   Shape: not taller-than-wide (0)   Margins: ill-defined (0)   Echogenic foci: punctate echogenic foci (3)   ACR TI-RADS total points: 7.   ACR TI-RADS risk category: TR5 (>/= 7 points).   ACR TI-RADS recommendations:   **Given size (>/= 1.0 cm) and appearance, fine needle aspiration of this highly suspicious nodule should be considered based on TI-RADS criteria.   _________________________________________________________   IMPRESSION: 1. Nodule 1 (TI-RADS 5) located in the mid right thyroid lobe measures 1.3 x 0.9 x 0.8 cm and meets criteria for FNA. 2. Nodule 2 TI-RADS 5 located in the inferior right thyroid lobe measures 1.5 x 1.3 x 1.2 cm and meets criteria for FNA.      ABI 03/13/2021 Right: Resting right ankle-brachial index is within normal  range. No  evidence of significant right lower extremity arterial disease. The right  toe-brachial index is normal.   Left: Resting left ankle-brachial index is within normal range. No  evidence of significant left lower extremity arterial disease. The left  toe-brachial index is normal.    ASSESSMENT / PLAN / RECOMMENDATIONS:   1) Type 1 Diabetes Mellitus, Poorly controlled, With retinopathic, neuropathic complications and gastroparesis  - Most recent A1c of 9.9 %. Goal A1c < 7.0 %.    -She has not been on the insulin pump long enough but in review of her pump and CGM download her BG's seem to be improving.  The only issue I see is that she does not always bolus when she eats.   -Today we emphasized importance of carb counting and entering this information into the pump that way she receives appropriate dose of prandial insulin -Due to gastroparesis she takes insulin post-prandial, it will be difficult to reach optimal glycemic control  -No changes will be made to her pump settings today    MEDICATIONS:  - Novolin - R   Pump   Tandem  Settings   Insulin type  Novolin-R    Basal rate       0000 0.5 u/h           I:C ratio       0000-0600  1:12          Sensitivity       0000  50      Goal       0000  70-110         EDUCATION / INSTRUCTIONS: BG monitoring instructions: Patient is instructed to check her blood sugars 3 times a day, before meals . Call [facility] if: BG persistently < 70  I reviewed the Rule of 15 for the treatment of hypoglycemia in detail with the patient. Literature supplied.   2) Diabetic complications:  Eye: Does  have known diabetic retinopathy.  Neuro/ Feet: Does  have known diabetic peripheral neuropathy. Renal: Patient does not have known baseline CKD. She is  on an ACEI/ARB at present   3) Multinodular Goiter :  -Previous ultrasound from 02/2020 shows multinodular goiter and 2 of these nodules meet FNA criteria. -FNA  orders have been placed but [facility] contacted [place] for an updated ultrasound which was also placed     F/U in 4 months    Signed electronically by: Mack Guise, MD  [facility]  [facility] [address] [place], [place] [address] Phone: [phone] FAX: [phone]   CC: [name], [address] [place] [address] Phone: [phone]  Fax: [phone]    Return to Endocrinology clinic as below: Future Appointments  Date Time Provider [place]  04/18/2021  3:15 PM Trula Slade, DPM [facility]  04/29/2021  2:45 PM Valinda Party, DO [facility]  05/22/2021 10:00 AM [facility]  08/12/2021  2:00 PM [name], MD [facility]

## 2021-04-19 ENCOUNTER — Encounter: Payer: Self-pay | Admitting: Internal Medicine

## 2021-04-19 ENCOUNTER — Telehealth: Payer: Self-pay

## 2021-04-19 DIAGNOSIS — E042 Nontoxic multinodular goiter: Secondary | ICD-10-CM

## 2021-04-19 LAB — CBC WITH DIFFERENTIAL/PLATELET
Absolute Monocytes: 607 cells/uL (ref 200–950)
Basophils Absolute: 53 cells/uL (ref 0–200)
Basophils Relative: 0.6 %
Eosinophils Absolute: 150 cells/uL (ref 15–500)
Eosinophils Relative: 1.7 %
HCT: 32.7 % — ABNORMAL LOW (ref 35.0–45.0)
Hemoglobin: 10.8 g/dL — ABNORMAL LOW (ref 11.7–15.5)
Lymphs Abs: 3942 cells/uL — ABNORMAL HIGH (ref 850–3900)
MCH: 29.2 pg (ref 27.0–33.0)
MCHC: 33 g/dL (ref 32.0–36.0)
MCV: 88.4 fL (ref 80.0–100.0)
MPV: 10.1 fL (ref 7.5–12.5)
Monocytes Relative: 6.9 %
Neutro Abs: 4048 cells/uL (ref 1500–7800)
Neutrophils Relative %: 46 %
Platelets: 413 10*3/uL — ABNORMAL HIGH (ref 140–400)
RBC: 3.7 10*6/uL — ABNORMAL LOW (ref 3.80–5.10)
RDW: 13 % (ref 11.0–15.0)
Total Lymphocyte: 44.8 %
WBC: 8.8 10*3/uL (ref 3.8–10.8)

## 2021-04-19 LAB — BASIC METABOLIC PANEL
BUN/Creatinine Ratio: 28 (calc) — ABNORMAL HIGH (ref 6–22)
BUN: 32 mg/dL — ABNORMAL HIGH (ref 7–25)
CO2: 25 mmol/L (ref 20–32)
Calcium: 9.8 mg/dL (ref 8.6–10.2)
Chloride: 102 mmol/L (ref 98–110)
Creat: 1.16 mg/dL — ABNORMAL HIGH (ref 0.50–0.96)
Glucose, Bld: 88 mg/dL (ref 65–99)
Potassium: 4.5 mmol/L (ref 3.5–5.3)
Sodium: 137 mmol/L (ref 135–146)

## 2021-04-19 LAB — C-REACTIVE PROTEIN: CRP: 1.5 mg/L (ref <8.0)

## 2021-04-19 LAB — SEDIMENTATION RATE: Sed Rate: 58 mm/h — ABNORMAL HIGH (ref 0–20)

## 2021-04-19 NOTE — Telephone Encounter (Signed)
Patient needs to have [place] before they can do a biopsy. It has to be within a year. Please put order in. ?

## 2021-04-20 ENCOUNTER — Encounter (HOSPITAL_COMMUNITY): Payer: Self-pay | Admitting: Emergency Medicine

## 2021-04-20 ENCOUNTER — Other Ambulatory Visit: Payer: Self-pay

## 2021-04-20 ENCOUNTER — Inpatient Hospital Stay (HOSPITAL_COMMUNITY)
Admission: EM | Admit: 2021-04-20 | Discharge: 2021-04-24 | DRG: 074 | Disposition: A | Discharge status: Home / Self Care | Payer: 59 | Attending: Internal Medicine | Admitting: Internal Medicine

## 2021-04-20 DIAGNOSIS — K2100 Gastro-esophageal reflux disease with esophagitis, without bleeding: Secondary | ICD-10-CM | POA: Diagnosis present

## 2021-04-20 DIAGNOSIS — E785 Hyperlipidemia, unspecified: Secondary | ICD-10-CM | POA: Diagnosis present

## 2021-04-20 DIAGNOSIS — K3184 Gastroparesis: Secondary | ICD-10-CM | POA: Diagnosis not present

## 2021-04-20 DIAGNOSIS — Z20822 Contact with and (suspected) exposure to covid-19: Secondary | ICD-10-CM | POA: Diagnosis present

## 2021-04-20 DIAGNOSIS — Z833 Family history of diabetes mellitus: Secondary | ICD-10-CM

## 2021-04-20 DIAGNOSIS — R0600 Dyspnea, unspecified: Secondary | ICD-10-CM

## 2021-04-20 DIAGNOSIS — Z89422 Acquired absence of other left toe(s): Secondary | ICD-10-CM

## 2021-04-20 DIAGNOSIS — R112 Nausea with vomiting, unspecified: Secondary | ICD-10-CM | POA: Diagnosis not present

## 2021-04-20 DIAGNOSIS — E1043 Type 1 diabetes mellitus with diabetic autonomic (poly)neuropathy: Principal | ICD-10-CM | POA: Diagnosis present

## 2021-04-20 DIAGNOSIS — E1159 Type 2 diabetes mellitus with other circulatory complications: Secondary | ICD-10-CM | POA: Diagnosis present

## 2021-04-20 DIAGNOSIS — L97522 Non-pressure chronic ulcer of other part of left foot with fat layer exposed: Secondary | ICD-10-CM

## 2021-04-20 DIAGNOSIS — E10621 Type 1 diabetes mellitus with foot ulcer: Secondary | ICD-10-CM | POA: Diagnosis present

## 2021-04-20 DIAGNOSIS — E0865 Diabetes mellitus due to underlying condition with hyperglycemia: Secondary | ICD-10-CM

## 2021-04-20 DIAGNOSIS — D649 Anemia, unspecified: Secondary | ICD-10-CM | POA: Diagnosis present

## 2021-04-20 DIAGNOSIS — E1069 Type 1 diabetes mellitus with other specified complication: Secondary | ICD-10-CM | POA: Diagnosis present

## 2021-04-20 DIAGNOSIS — E0590 Thyrotoxicosis, unspecified without thyrotoxic crisis or storm: Secondary | ICD-10-CM | POA: Diagnosis present

## 2021-04-20 DIAGNOSIS — I152 Hypertension secondary to endocrine disorders: Secondary | ICD-10-CM | POA: Diagnosis present

## 2021-04-20 DIAGNOSIS — Z794 Long term (current) use of insulin: Secondary | ICD-10-CM

## 2021-04-20 DIAGNOSIS — Z79899 Other long term (current) drug therapy: Secondary | ICD-10-CM

## 2021-04-20 DIAGNOSIS — K30 Functional dyspepsia: Secondary | ICD-10-CM | POA: Diagnosis present

## 2021-04-20 DIAGNOSIS — L97529 Non-pressure chronic ulcer of other part of left foot with unspecified severity: Secondary | ICD-10-CM | POA: Diagnosis present

## 2021-04-20 DIAGNOSIS — R1084 Generalized abdominal pain: Secondary | ICD-10-CM

## 2021-04-20 DIAGNOSIS — Z9641 Presence of insulin pump (external) (internal): Secondary | ICD-10-CM | POA: Diagnosis present

## 2021-04-20 DIAGNOSIS — E1042 Type 1 diabetes mellitus with diabetic polyneuropathy: Secondary | ICD-10-CM | POA: Diagnosis present

## 2021-04-20 HISTORY — DX: Nausea with vomiting, unspecified: R11.2

## 2021-04-20 LAB — URINALYSIS, ROUTINE W REFLEX MICROSCOPIC
Bilirubin Urine: NEGATIVE
Glucose, UA: 50 mg/dL — AB
Ketones, ur: 5 mg/dL — AB
Leukocytes,Ua: NEGATIVE
Nitrite: NEGATIVE
Protein, ur: >=300 mg/dL — AB
Specific Gravity, Urine: 1.018 (ref 1.005–1.030)
pH: 5 (ref 5.0–8.0)

## 2021-04-20 LAB — COMPREHENSIVE METABOLIC PANEL
ALT: 23 U/L (ref 0–44)
AST: 24 U/L (ref 15–41)
Albumin: 3.6 g/dL (ref 3.5–5.0)
Alkaline Phosphatase: 94 U/L (ref 38–126)
Anion gap: 8 (ref 5–15)
BUN: 19 mg/dL (ref 6–20)
CO2: 24 mmol/L (ref 22–32)
Calcium: 9.4 mg/dL (ref 8.9–10.3)
Chloride: 105 mmol/L (ref 98–111)
Creatinine, Ser: 1.02 mg/dL — ABNORMAL HIGH (ref 0.44–1.00)
GFR, Estimated: >60 mL/min (ref >60)
Glucose, Bld: 149 mg/dL — ABNORMAL HIGH (ref 70–99)
Potassium: 3.6 mmol/L (ref 3.5–5.1)
Sodium: 137 mmol/L (ref 135–145)
Total Bilirubin: 0.7 mg/dL (ref 0.3–1.2)
Total Protein: 8 g/dL (ref 6.5–8.1)

## 2021-04-20 LAB — RAPID URINE DRUG SCREEN, HOSP PERFORMED
Amphetamines: NOT DETECTED
Barbiturates: NOT DETECTED
Benzodiazepines: NOT DETECTED
Cocaine: NOT DETECTED
Opiates: POSITIVE — AB
Tetrahydrocannabinol: NOT DETECTED

## 2021-04-20 LAB — CBC
HCT: 36.5 % (ref 36.0–46.0)
Hemoglobin: 11.9 g/dL — ABNORMAL LOW (ref 12.0–15.0)
MCH: 28.8 pg (ref 26.0–34.0)
MCHC: 32.6 g/dL (ref 30.0–36.0)
MCV: 88.4 fL (ref 80.0–100.0)
Platelets: 461 10*3/uL — ABNORMAL HIGH (ref 150–400)
RBC: 4.13 MIL/uL (ref 3.87–5.11)
RDW: 13.2 % (ref 11.5–15.5)
WBC: 9.6 10*3/uL (ref 4.0–10.5)
nRBC: 0 % (ref 0.0–0.2)

## 2021-04-20 LAB — RESP PANEL BY RT-PCR (FLU A&B, COVID) ARPGX2
Influenza A by PCR: NEGATIVE
Influenza B by PCR: NEGATIVE
SARS Coronavirus 2 by RT PCR: NEGATIVE

## 2021-04-20 LAB — LIPASE, BLOOD: Lipase: 27 U/L (ref 11–51)

## 2021-04-20 LAB — HCG, QUANTITATIVE, PREGNANCY: hCG, Beta Chain, Quant, S: 1 m[IU]/mL (ref <5)

## 2021-04-20 MED ORDER — METOCLOPRAMIDE HCL 5 MG/ML IJ SOLN
10.0000 mg | Freq: Four times a day (QID) | INTRAMUSCULAR | Status: DC
  Administered 2021-04-20 – 2021-04-24 (×14): 10 mg via INTRAVENOUS
  Filled 2021-04-20 (×14): qty 2

## 2021-04-20 MED ORDER — PANTOPRAZOLE SODIUM 40 MG IV SOLR
40.0000 mg | Freq: Once | INTRAVENOUS | Status: AC
  Administered 2021-04-20: 40 mg via INTRAVENOUS
  Filled 2021-04-20: qty 10

## 2021-04-20 MED ORDER — KETOROLAC TROMETHAMINE 30 MG/ML IJ SOLN
30.0000 mg | Freq: Four times a day (QID) | INTRAMUSCULAR | Status: DC | PRN
  Administered 2021-04-21 – 2021-04-22 (×2): 30 mg via INTRAVENOUS
  Filled 2021-04-20 (×3): qty 1

## 2021-04-20 MED ORDER — PROCHLORPERAZINE EDISYLATE 10 MG/2ML IJ SOLN
10.0000 mg | Freq: Once | INTRAMUSCULAR | Status: AC
  Administered 2021-04-20: 10 mg via INTRAVENOUS
  Filled 2021-04-20: qty 2

## 2021-04-20 MED ORDER — METOCLOPRAMIDE HCL 5 MG/ML IJ SOLN
10.0000 mg | Freq: Once | INTRAMUSCULAR | Status: AC
Start: 2021-04-20 — End: 2021-04-20
  Administered 2021-04-20: 10 mg via INTRAVENOUS
  Filled 2021-04-20: qty 2

## 2021-04-20 MED ORDER — LACTATED RINGERS IV SOLN: INTRAVENOUS | Status: AC

## 2021-04-20 MED ORDER — ONDANSETRON 8 MG PO TBDP
8.0000 mg | ORAL_TABLET | Freq: Once | ORAL | Status: AC
  Administered 2021-04-20: 8 mg via ORAL
  Filled 2021-04-20: qty 1

## 2021-04-20 MED ORDER — INSULIN PUMP
SUBCUTANEOUS | Status: DC
  Administered 2021-04-23: 4 via SUBCUTANEOUS
  Administered 2021-04-23: 1.9 via SUBCUTANEOUS
  Administered 2021-04-23: 2.8 via SUBCUTANEOUS
  Administered 2021-04-23: 0.43 via SUBCUTANEOUS
  Administered 2021-04-24: 1.05 via SUBCUTANEOUS
  Filled 2021-04-20: qty 1

## 2021-04-20 MED ORDER — ACETAMINOPHEN 650 MG RE SUPP: 650.0000 mg | Freq: Four times a day (QID) | RECTAL | Status: DC | PRN

## 2021-04-20 MED ORDER — FAMOTIDINE IN NACL 20-0.9 MG/50ML-% IV SOLN
20.0000 mg | Freq: Once | INTRAVENOUS | Status: AC
  Administered 2021-04-20: 20 mg via INTRAVENOUS
  Filled 2021-04-20: qty 50

## 2021-04-20 MED ORDER — SODIUM CHLORIDE 0.9 % IV SOLN
3.0000 g | Freq: Four times a day (QID) | INTRAVENOUS | Status: DC
  Administered 2021-04-20 – 2021-04-24 (×15): 3 g via INTRAVENOUS
  Filled 2021-04-20 (×18): qty 8

## 2021-04-20 MED ORDER — SODIUM CHLORIDE 0.9 % IV BOLUS
1000.0000 mL | Freq: Once | INTRAVENOUS | Status: AC
  Administered 2021-04-20: 1000 mL via INTRAVENOUS

## 2021-04-20 MED ORDER — ENOXAPARIN SODIUM 40 MG/0.4ML IJ SOSY
40.0000 mg | PREFILLED_SYRINGE | INTRAMUSCULAR | Status: DC
  Administered 2021-04-20 – 2021-04-23 (×4): 40 mg via SUBCUTANEOUS
  Filled 2021-04-20 (×4): qty 0.4

## 2021-04-20 MED ORDER — LABETALOL HCL 5 MG/ML IV SOLN
10.0000 mg | INTRAVENOUS | Status: DC | PRN
  Administered 2021-04-21 – 2021-04-22 (×2): 10 mg via INTRAVENOUS
  Filled 2021-04-20 (×2): qty 4

## 2021-04-20 MED ORDER — ALBUTEROL SULFATE HFA 108 (90 BASE) MCG/ACT IN AERS: 2.0000 | INHALATION_SPRAY | Freq: Four times a day (QID) | RESPIRATORY_TRACT | Status: DC | PRN

## 2021-04-20 MED ORDER — HYDROMORPHONE HCL 1 MG/ML IJ SOLN
1.0000 mg | Freq: Once | INTRAMUSCULAR | Status: AC
  Administered 2021-04-20: 1 mg via INTRAVENOUS
  Filled 2021-04-20: qty 1

## 2021-04-20 MED ORDER — HYDROMORPHONE HCL 1 MG/ML IJ SOLN
0.5000 mg | Freq: Once | INTRAMUSCULAR | Status: AC
  Administered 2021-04-20: 0.5 mg via INTRAVENOUS
  Filled 2021-04-20: qty 1

## 2021-04-20 MED ORDER — SODIUM CHLORIDE 0.9% FLUSH
3.0000 mL | Freq: Two times a day (BID) | INTRAVENOUS | Status: DC
  Administered 2021-04-21 – 2021-04-23 (×4): 3 mL via INTRAVENOUS

## 2021-04-20 MED ORDER — LACTATED RINGERS IV BOLUS
1000.0000 mL | Freq: Once | INTRAVENOUS | Status: AC
  Administered 2021-04-20: 1000 mL via INTRAVENOUS

## 2021-04-20 MED ORDER — ONDANSETRON HCL 4 MG PO TABS: 4.0000 mg | ORAL_TABLET | Freq: Four times a day (QID) | ORAL | Status: DC | PRN

## 2021-04-20 MED ORDER — ACETAMINOPHEN 325 MG PO TABS: 650.0000 mg | ORAL_TABLET | Freq: Four times a day (QID) | ORAL | Status: DC | PRN

## 2021-04-20 MED ORDER — ALBUTEROL SULFATE (2.5 MG/3ML) 0.083% IN NEBU: 2.5000 mg | INHALATION_SOLUTION | Freq: Four times a day (QID) | RESPIRATORY_TRACT | Status: DC | PRN

## 2021-04-20 MED ORDER — PANTOPRAZOLE SODIUM 40 MG IV SOLR
40.0000 mg | INTRAVENOUS | Status: DC
Start: 2021-04-21 — End: 2021-04-22
  Administered 2021-04-21 – 2021-04-22 (×2): 40 mg via INTRAVENOUS
  Filled 2021-04-20 (×2): qty 10

## 2021-04-20 MED ORDER — SODIUM CHLORIDE 0.9 % IV SOLN: 25.0000 mg | Freq: Once | INTRAVENOUS | Status: DC

## 2021-04-20 MED ORDER — ONDANSETRON HCL 4 MG/2ML IJ SOLN
4.0000 mg | Freq: Four times a day (QID) | INTRAMUSCULAR | Status: DC | PRN
  Administered 2021-04-21: 4 mg via INTRAVENOUS
  Filled 2021-04-20: qty 2

## 2021-04-20 NOTE — Assessment & Plan Note (Signed)
Serum glucose control on admission without evidence of DKA/HHS.  Last A1c was 9.9% on 02/25/2021. ?-Continue insulin pump ?

## 2021-04-20 NOTE — ED Notes (Addendum)
Patient has insulin pump and DexCom.  Reports pump is active and functioning properly.  Patient is able to operate pump without assistance. ?

## 2021-04-20 NOTE — Assessment & Plan Note (Signed)
Resume atorvastatin when able. ?

## 2021-04-20 NOTE — Hospital Course (Signed)
Kelli Hudson is a 31 y.o. 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 ?

## 2021-04-20 NOTE — Assessment & Plan Note (Signed)
Initially hypertensive, BP now improved. ?-Resume lisinopril when tolerating oral meds ?-IV labetalol as needed ?

## 2021-04-20 NOTE — H&P (Signed)
History and Physical    Kelli Hudson YFV:[id number] DOB: November 11, 1990 DOA: 04/20/2021  PCP: [name], MD  Patient coming from: Home  I have personally briefly reviewed patient's old medical records in [place]  Chief Complaint: Nausea and vomiting  HPI: Kelli Hudson is a 31 y.o. 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  Patient reports 2 days of persistent nausea and vomiting with abdominal pain.  She says this is similar to her prior exacerbations of her gastroparesis.  She does state that symptoms began after she ate out at a restaurant (had fried pickles and chicken wings).  She says there was some blood in her initial emesis which is now clear.  She reports generalized abdominal discomfort and retrosternal burning sensation.  Last bowel movement was day prior to admit and was formed.  She has not had any diarrhea.  She denies any dysuria.  She does have a known wound/ulcer on her left foot for which she is following with wound care and podiatry.  She saw her podiatrist on 3/2 and due to some swelling in the area she was started on empiric antibiotics with Augmentin.  Patient denies any subjective fevers or purulent discharge from her wound.  She has not been able to maintain any adequate oral intake but states that she was able to take her medicines this morning after using sublingual Zofran.  ED Course   Labs/Imaging on admission: I have personally reviewed following labs and imaging studies.  Initial vitals showed BP 154/97, pulse 108, RR 20, temp 98.7 F, SPO2 99% room air.  Labs show WBC 9.6, hemoglobin 11.9, platelets 461,000, sodium 137, potassium 3.6, bicarb 24, BUN 19, creatinine 1.02, serum glucose 149,  LFTs within normal limits, lipase 27, beta-hCG 1.  Urinalysis shows 5 ketones, >300 protein, negative nitrites, negative leukocytes, 11-20 RBC/hpf, 0-5 WBC/hpf, rare bacteria microscopy.  UDS positive for opiates (received 2 doses of IV Dilaudid prior to collection).  While in the ED, patient received 3 L normal saline, IV Pepcid, IV Protonix, IV Zofran, IV Reglan, IV Compazine, IV Dilaudid 0.5 mg x 2 and 1 mg x 2.  She had persistent nausea and vomiting and the hospitalist service was consulted to admit for further evaluation and management.  Review of Systems: All systems reviewed and are negative except as documented in history of present illness above.   Past Medical History:  Diagnosis Date   Acute H. pylori gastric ulcer    Diabetes mellitus ([place])    Diabetic gastroparesis ([place])    Gastroparesis    GERD (gastroesophageal reflux disease)    Hypertension    Hyperthyroidism     Past Surgical History:  Procedure Laterality Date   AMPUTATION TOE Left 03/10/2018   Procedure: AMPUTATION FIFTH TOE;  Surgeon: Trula Slade, DPM;  Location: [facility];  Service: Podiatry;  Laterality: Left;   BIOPSY  01/28/2020   Procedure: BIOPSY;  Surgeon: Otis Brace, MD;  Location: [facility];  Service: Gastroenterology;;   ESOPHAGOGASTRODUODENOSCOPY N/A 01/28/2020   Procedure: ESOPHAGOGASTRODUODENOSCOPY (EGD);  Surgeon: Otis Brace, MD;  Location: [facility];  Service: Gastroenterology;  Laterality: N/A;   ESOPHAGOGASTRODUODENOSCOPY (EGD) WITH PROPOFOL Left 09/08/2015   Procedure: ESOPHAGOGASTRODUODENOSCOPY (EGD) WITH PROPOFOL;  Surgeon: Arta Silence, MD;  Location: [facility];  Service: Endoscopy;  Laterality: Left;   WISDOM TOOTH EXTRACTION      Social History:  reports that she has never smoked. She has never used smokeless tobacco. She reports current alcohol use of about 1.0 standard drink per week. She reports that she does not use drugs.  No Known  Allergies  Family History  Problem Relation Age of Onset   Lung cancer Mother    Diabetes Mother    Bipolar disorder Father    Liver cancer Maternal Grandfather    Breast cancer Other        maternal great aunt   Heart disease Other    Ovarian cancer Other        maternal great aunt   Pancreatic cancer Maternal Uncle    Prostate cancer Maternal Uncle    Diabetes Maternal Aunt        x 2   Kidney disease Other        maternal great aunt     Prior to Admission medications   Medication Sig Start Date End Date Taking? Authorizing Provider  albuterol (VENTOLIN HFA) 108 (90 Base) MCG/ACT inhaler Inhale 2 puffs into the lungs every 6 (six) hours as needed for shortness of breath.   Yes [provider]  amoxicillin-clavulanate (AUGMENTIN) 875-125 MG tablet Take 1 tablet by mouth 2 (two) times daily. Patient taking differently: Take 1 tablet by mouth 2 (two) times daily. Start date :04/19/21 04/18/21  Yes Trula Slade, DPM  atorvastatin (LIPITOR) 20 MG tablet Take 1 tablet (20 mg total) by mouth daily. 04/10/21  Yes [name], MD  famotidine (PEPCID) 20 MG tablet TAKE 1 TABLET(20 MG) BY MOUTH TWICE DAILY Patient taking differently: Take 20 mg by mouth 2 (two) times daily. 04/01/21  Yes [name], MD  hyoscyamine (LEVBID) 0.375 MG 12 hr tablet Take 1 tablet (0.375 mg total) by mouth every 12 (twelve) hours as needed for cramping (abdominal pain). 05/14/20  Yes Sheikh, Omair Latif, DO  insulin regular (NOVOLIN R) 100 units/mL injection Inject 0-0.15 mLs (0-15 Units total) into the skin 3 (three) times daily before meals. Patient taking differently: Inject 0-15 Units into the skin See admin instructions. Via pump - Patient not sure about the dosage. 01/30/21  Yes Shamleffer, Melanie Crazier, MD  lisinopril (ZESTRIL) 10 MG tablet Take 1 tablet (10 mg total) by mouth daily. 09/27/20  Yes [name], MD  metoCLOPramide (REGLAN) 10 MG tablet Take 1 tablet (10 mg total) by  mouth 3 (three) times daily before meals. Patient taking differently: Take 10 mg by mouth every 8 (eight) hours as needed for vomiting or nausea. 02/28/21 04/20/21 Yes Donne Hazel, MD  pantoprazole (PROTONIX) 40 MG tablet Take 40 mg by mouth daily. 09/17/20  Yes [provider]  promethazine (PHENERGAN) 12.5 MG tablet Take 1 tablet (12.5 mg total) by mouth every 6 (six) hours as needed for nausea or vomiting. 05/14/20  Yes Sheikh, Omair Latif, DO  Blood Glucose Monitoring Suppl (TRUE METRIX METER) w/Device KIT 1 Device by Does not apply route 4 (four) times daily. 07/05/19   Libby Maw, MD  calcium gluconate 500 MG tablet Take 1 tablet (500 mg total) by mouth 3 (three) times daily. Patient not taking: Reported on 04/20/2021 09/24/20   [name], MD  collagenase (SANTYL) ointment Apply 1 application topically daily. Patient not taking: Reported on 04/20/2021 01/22/21   Trula Slade, DPM  Continuous Blood Gluc Receiver ([place]) [place]  USE AS DIRECTED Patient taking differently: 1 each by Other route as directed. 11/21/20   [name], MD  Continuous Blood Gluc Sensor (DEXCOM G6 SENSOR) MISC 1 each by Does not apply route daily. 11/21/20   [name], MD  Continuous Blood Gluc Transmit (DEXCOM G6 TRANSMITTER) MISC 1 each by Does not apply route daily. 09/24/20   [name], MD  glucose blood (TRUE METRIX BLOOD GLUCOSE TEST) test strip Use as instructed 09/03/20   [name], MD  Insulin Pen Needle 31G X 5 MM MISC 1 Device by Does not apply route QID. For use with insulin pens 07/05/19   Libby Maw, MD  TRUEPLUS LANCETS 28G MISC assist with checking blood sugar TID and qhs Patient taking differently: 1 each by Other route See admin instructions. assist with checking blood sugar TID and qhs 09/10/15   Zettie Pho S, PA-C  insulin aspart (NOVOLOG) 100 UNIT/ML FlexPen Inject 5 Units into the skin 3 (three) times daily with meals. 02/23/18 07/05/19   Donne Hazel, MD    Physical Exam: Vitals:   04/20/21 2045 04/20/21 2100 04/20/21 2115 04/20/21 2130  BP: 121/76 110/64 109/75 132/78  Pulse: (!) 118 (!) 120 (!) 126 (!) 127  Resp:    18  Temp:    97.9 F (36.6 C)  TempSrc:    Oral  SpO2: 94% 91% 96% 98%   Constitutional: Resting in bed in the right lateral decubitus position, NAD, calm, comfortable Eyes: PERRL, lids and conjunctivae normal ENMT: Mucous membranes are dry. Posterior pharynx clear of any exudate or lesions.Normal dentition.  Neck: normal, supple, no masses. Respiratory: clear to auscultation bilaterally, no wheezing, no crackles. Normal respiratory effort. No accessory muscle use.  Cardiovascular: Tachycardic, no murmurs / rubs / gallops. No extremity edema. 2+ pedal pulses. Abdomen: Tender to light touch throughout, no masses palpated. No hepatosplenomegaly. Bowel sounds diminished.  Musculoskeletal: S/p left great toe amputation.  No clubbing / cyanosis. No joint deformity upper and lower extremities. Good ROM, no contractures. Normal muscle tone.  Skin: Circumferential ulcer left lateral foot and wound dorsal aspect left foot as pictured below (photos taken in podiatry office 3/2).  Area currently wrapped. Neurologic: CN 2-12 grossly intact. Sensation intact. Strength 5/5 in all 4.  Psychiatric:  Alert and oriented x 3. Normal mood.       EKG: Not performed.  Assessment/Plan Principal Problem:   Intractable nausea and vomiting Active Problems:   Diabetic gastroparesis associated with type 1 diabetes mellitus (HCC)   Type 1 diabetes mellitus with diabetic polyneuropathy (HCC)   Hypertension associated with diabetes ([place])   Ulcerated, foot, left, with fat layer exposed ([place])   Hyperlipidemia   Kelli Hudson is a 31 y.o. female with medical history significant for type 1 diabetes on insulin pump (complicated by retinopathy, peripheral neuropathy, and gastroparesis), HTN, HLD, GERD, osteomyelitis of  the left fifth toe s/p ray amputation who is admitted with intractable nausea and vomiting due to gastroparesis.  Assessment and Plan: * Intractable nausea and vomiting Patient with intractable nausea vomiting likely due to recurrent gastroparesis.  She does report some recent dietary indiscretion which may have exacerbated her symptoms.  She still does not feel like she is able to maintain any adequate oral intake or even take oral medications. -Keep n.p.o. tonight -Give additional 1 L LR bolus followed by maintenance fluids overnight -Start on scheduled Reglan with IV Zofran as needed -Limit narcotics, use IV Toradol if needed for  severe pain -IV Protonix for GERD symptoms  Type 1 diabetes mellitus with diabetic polyneuropathy (HCC) Serum glucose control on admission without evidence of DKA/HHS.  Last A1c was 9.9% on 02/25/2021. -Continue insulin pump  Hypertension associated with diabetes ([place]) Initially hypertensive, BP now improved. -Resume lisinopril when tolerating oral meds -IV labetalol as needed  Ulcerated, foot, left, with fat layer exposed ([place]) Chronic issue following with wound care and podiatry.  Seen by podiatry on 3/2 and started on oral Augmentin due to risk of developing infection. -Convert to IV Unasyn and transition back to Augmentin when she is tolerating oral meds  Hyperlipidemia Resume atorvastatin when able.  DVT prophylaxis: enoxaparin (LOVENOX) injection 40 mg Start: 04/20/21 2200 Code Status: Full code Family Communication: Discussed with patient, she has discussed with family Disposition Plan: From home and likely discharged home pending clinical progress Consults called: None Severity of Illness: The appropriate patient status for this patient is OBSERVATION. Observation status is judged to be reasonable and necessary in order to provide the required intensity of service to ensure the patient's safety. The patient's presenting symptoms, physical exam  findings, and initial radiographic and laboratory data in the context of their medical condition is felt to place them at decreased risk for further clinical deterioration. Furthermore, it is anticipated that the patient will be medically stable for discharge from the hospital within 2 midnights of admission.   Zada Finders MD [facility]  If 7PM-7AM, please contact night-coverage [email or web address]  04/20/2021, 10:19 PM

## 2021-04-20 NOTE — Assessment & Plan Note (Signed)
Patient with intractable nausea vomiting likely due to recurrent gastroparesis.  She does report some recent dietary indiscretion which may have exacerbated her symptoms.  She still does not feel like she is able to maintain any adequate oral intake or even take oral medications. ?-Keep n.p.o. tonight ?-Give additional 1 L LR bolus followed by maintenance fluids overnight ?-Start on scheduled Reglan with IV Zofran as needed ?-Limit narcotics, use IV Toradol if needed for severe pain ?-IV Protonix for GERD symptoms ?

## 2021-04-20 NOTE — Assessment & Plan Note (Signed)
Chronic issue following with wound care and podiatry.  Seen by podiatry on 3/2 and started on oral Augmentin due to risk of developing infection. ?-Convert to IV Unasyn and transition back to Augmentin when she is tolerating oral meds ?

## 2021-04-20 NOTE — ED Provider Notes (Signed)
[facility] DEPT Provider Note   CSN: [id number] Arrival date & time: 04/20/21  1230     History  Chief Complaint  Patient presents with   Abdominal Pain   Emesis    Kelli Hudson is a 31 y.o. female.  The history is provided by the patient and medical records. No language interpreter was used.  Abdominal Pain Associated symptoms: vomiting   Emesis Associated symptoms: abdominal pain    31 year old female significant history of type 1 diabetes, recurrent diabetic gastroparesis, hypertension, GERD who presents for evaluation of abdominal pain.  Patient reports since yesterday she has had diffuse abdominal cramping with associated nausea and vomiting that felt very similar to prior diabetic gastroparesis that she experiences on a monthly basis.  Emesis is nonbloody nonbilious.  She endorsed mild chills.  She denies fever runny nose sneezing coughing sore throat back pain dysuria hematuria vaginal bleeding or vaginal discharge.  She is unsure what triggers her symptoms.  She mentioned that her blood sugar has been well controlled recently.  She denies alcohol or tobacco use she denies marijuana use.  She has Nexplanon unable to recall last menstruation.  She denies any recent sick contact.  Home Medications Prior to Admission medications   Medication Sig Start Date End Date Taking? Authorizing Provider  albuterol (VENTOLIN HFA) 108 (90 Base) MCG/ACT inhaler Inhale 2 puffs into the lungs every 6 (six) hours as needed for shortness of breath.    [provider]  amoxicillin-clavulanate (AUGMENTIN) 875-125 MG tablet Take 1 tablet by mouth 2 (two) times daily. 04/18/21   Trula Slade, DPM  atorvastatin (LIPITOR) 20 MG tablet Take 1 tablet (20 mg total) by mouth daily. 04/10/21   [name], MD  Blood Glucose Monitoring Suppl (TRUE METRIX METER) w/Device KIT 1 Device by Does not apply route 4 (four) times daily. 07/05/19   Libby Maw, MD  calcium gluconate 500 MG tablet Take 1 tablet (500 mg total) by mouth 3 (three) times daily. 09/24/20   [name], MD  collagenase (SANTYL) ointment Apply 1 application topically daily. 01/22/21   Trula Slade, DPM  Continuous Blood Gluc Receiver (DEXCOM G6 RECEIVER) [place] USE AS DIRECTED Patient taking differently: 1 each by Other route as directed. 11/21/20   [name], MD  Continuous Blood Gluc Sensor (DEXCOM G6 SENSOR) MISC 1 each by Does not apply route daily. 11/21/20   [name], MD  Continuous Blood Gluc Transmit (DEXCOM G6 TRANSMITTER) MISC 1 each by Does not apply route daily. 09/24/20   [name], MD  famotidine (PEPCID) 20 MG tablet TAKE 1 TABLET(20 MG) BY MOUTH TWICE DAILY 04/01/21   [name], MD  glucose blood (TRUE METRIX BLOOD GLUCOSE TEST) test strip Use as instructed 09/03/20   [name], MD  hyoscyamine (LEVBID) 0.375 MG 12 hr tablet Take 1 tablet (0.375 mg total) by mouth every 12 (twelve) hours as needed for cramping (abdominal pain). 05/14/20   Raiford Noble Latif, DO  Insulin Pen Needle 31G X 5 MM MISC 1 Device by Does not apply route QID. For use with insulin pens 07/05/19   Libby Maw, MD  insulin regular (NOVOLIN R) 100 units/mL injection Inject 0-0.15 mLs (0-15 Units total) into the skin 3 (three) times daily before meals. 01/30/21   Shamleffer, Melanie Crazier, MD  lisinopril (ZESTRIL) 10 MG tablet Take 1 tablet (10 mg total) by mouth daily. 09/27/20   Arlester Marker  M, MD  metoCLOPramide (REGLAN) 10 MG tablet Take 1 tablet (10 mg total) by mouth 3 (three) times daily before meals. 02/28/21 04/10/21  Donne Hazel, MD  pantoprazole (PROTONIX) 40 MG tablet Take 40 mg by mouth daily. 09/17/20   [provider]  promethazine (PHENERGAN) 12.5 MG tablet Take 1 tablet (12.5 mg total) by mouth every 6 (six) hours as needed for nausea or vomiting. 05/14/20   Raiford Noble Latif, DO  [place] assist with  checking blood sugar TID and qhs Patient taking differently: 1 each by Other route See admin instructions. assist with checking blood sugar TID and qhs 09/10/15   Zettie Pho S, PA-C  insulin aspart (NOVOLOG) 100 UNIT/ML FlexPen Inject 5 Units into the skin 3 (three) times daily with meals. 02/23/18 07/05/19  Donne Hazel, MD      Allergies    Patient has no known allergies.    Review of Systems   Review of Systems  Gastrointestinal:  Positive for abdominal pain and vomiting.  All other systems reviewed and are negative.  Physical Exam Updated Vital Signs BP (!) 154/97 (BP Location: Left Arm)    Pulse (!) 108    Temp 98.7 F (37.1 C)    Resp 20    SpO2 99%  Physical Exam Vitals and nursing note reviewed.  Constitutional:      Appearance: She is well-developed.     Comments: Patient appears uncomfortable actively vomiting  HENT:     Head: Atraumatic.  Eyes:     Conjunctiva/sclera: Conjunctivae normal.  Cardiovascular:     Rate and Rhythm: Regular rhythm. Tachycardia present.     Pulses: Normal pulses.     Heart sounds: Normal heart sounds.  Pulmonary:     Effort: Pulmonary effort is normal.  Abdominal:     Tenderness: There is abdominal tenderness (Diffuse abdominal tenderness without focal point tenderness).  Musculoskeletal:     Cervical back: Neck supple.  Skin:    Findings: No rash.  Neurological:     Mental Status: She is alert.  Psychiatric:        Mood and Affect: Mood normal.    ED Results / Procedures / Treatments   Labs (all labs ordered are listed, but only abnormal results are displayed) Labs Reviewed  COMPREHENSIVE METABOLIC PANEL - Abnormal; Notable for the following components:      Result Value   Glucose, Bld 149 (*)    Creatinine, Ser 1.02 (*)    All other components within normal limits  CBC - Abnormal; Notable for the following components:   Hemoglobin 11.9 (*)    Platelets 461 (*)    All other components within normal limits  URINALYSIS,  ROUTINE W REFLEX MICROSCOPIC - Abnormal; Notable for the following components:   APPearance HAZY (*)    Glucose, UA 50 (*)    Hgb urine dipstick SMALL (*)    Ketones, ur 5 (*)    Protein, ur >=300 (*)    Bacteria, UA RARE (*)    All other components within normal limits  RAPID URINE DRUG SCREEN, HOSP PERFORMED - Abnormal; Notable for the following components:   Opiates POSITIVE (*)    All other components within normal limits  LIPASE, BLOOD  HCG, QUANTITATIVE, PREGNANCY  CBG MONITORING, ED    EKG None  Radiology No results found.  Procedures Procedures    Medications Ordered in ED Medications  sodium chloride 0.9 % bolus 1,000 mL (0 mLs Intravenous Stopped  04/20/21 1542)  ondansetron (ZOFRAN-ODT) disintegrating tablet 8 mg (8 mg Oral Given 04/20/21 1413)  HYDROmorphone (DILAUDID) injection 0.5 mg (0.5 mg Intravenous Given 04/20/21 1413)  HYDROmorphone (DILAUDID) injection 0.5 mg (0.5 mg Intravenous Given 04/20/21 1545)  sodium chloride 0.9 % bolus 1,000 mL (1,000 mLs Intravenous New Bag/Given 04/20/21 1719)  HYDROmorphone (DILAUDID) injection 1 mg (1 mg Intravenous Given 04/20/21 1724)  metoCLOPramide (REGLAN) injection 10 mg (10 mg Intravenous Given 04/20/21 1724)    ED Course/ Medical Decision Making/ A&P                           Medical Decision Making Amount and/or Complexity of Data Reviewed Labs: ordered.  Risk Prescription drug management.   BP (!) 154/97 (BP Location: Left Arm)    Pulse (!) 108    Temp 98.7 F (37.1 C)    Resp 20    SpO2 99%   2:00 PM This is a 31 year old female significant history of type 1 diabetes as well as history of recurrent diabetic gastroparesis presenting with complaints of nausea vomiting and abdominal pain that felt very similar to her prior gastroparesis.  On exam patient appears uncomfortable, actively vomiting.  Work-up initiated.  Will provide IV fluid, antinausea medication, and opiate pain medication.  3:40 PM Labs was  independently reviewed and interpreted by me.  Normal lipase, electrolyte panels are reassuring, normal WBC, hemoglobin is 11.9, pregnancy test is negative.  After receiving initial dose of IV fluid, Dilaudid, and antinausea medication she did report some improvement of her nausea but still endorsed persistent abdominal discomfort.  Will provide additional pain management.  Will need urinalysis.  With glucose 149 and normal anion gap, symptoms not consistent with DKA  5:22 PM Pt have received 2 doses of dilaudid along with 1m of zofran but sxs not well controlled.  Will provide additional pain management and reglan.    5:49 PM Pt sign out to oncoming provider who will reassess pt in 30 min to ensure improvement of pain and nausea.  Pt will need recheck of her vital sign.  Pt may benefit admission for intractable nausea & vomiting 2/2 gastroparesis if no improvement.  Doubt SBO, or other acute abdominal pathology.  This patient presents to the ED for concern of abd pain, this involves an extensive number of treatment options, and is a complaint that carries with it a high risk of complications and morbidity.  The differential diagnosis includes diabetic gastroparesis, gastritis, viral GI, food intolerance, SBO, appendicitis, colitis, diverticulitis, cannabinoid hyperemesis syndrome  Co morbidities that complicate the patient evaluation DM Additional history obtained:  Additional history obtained from no one External records from outside source obtained and reviewed including prior ER visits  Lab Tests:  I Ordered, and personally interpreted labs.  The pertinent results include:  as above   Cardiac Monitoring:  The patient was maintained on a cardiac monitor.  I personally viewed and interpreted the cardiac monitored which showed an underlying rhythm of: sinus tachycardia  Medicines ordered and prescription drug management:  I ordered medication including multiple dose of opiate  for abd  pain Reevaluation of the patient after these medicines showed that the patient improved I have reviewed the patients home medicines and have made adjustments as needed  Test Considered: as above  Critical Interventions: IV antiemetic  IV opiate  IVF   Problem List / ED Course: abd pain  Nausea & vomiting  Reevaluation:  After the interventions noted  above, I reevaluated the patient and found that they have :improved          Final Clinical Impression(s) / ED Diagnoses Final diagnoses:  Gastroparesis    Rx / DC Orders ED Discharge Orders     None         Domenic Moras, PA-C 04/20/21 1754    Davonna Belling, MD 04/21/21 [phone]

## 2021-04-20 NOTE — ED Provider Notes (Signed)
Care handoff from Fayrene Helper, PA-C at shift change. Please see their note for further information.  ? ?Briefly: Patient with history of T1DM with associated gastroparesis presents today with diffuse abdominal pain and nausea/vomiting consistent with her normal symptoms associated with gastroparesis that she experiences regularly. Denies any marijuana use. ? ?Plan: No concern for DKA. Symptoms consistent with normal diabetic gastroparesis symptoms fluids, antiemetics, and pain meds given. He is still in pain with nausea and vomiting, second round of antiemetics given right at shift change. Plan to reevaluate in 30 minutes and determine dispo. ? ?Upon reassessment, patient continues to endorse 10/10 pain that is consistent with normal pain associated with gastroparesis. She is still actively vomiting on my exam. Upon discussion, she would like to try another round of pain medications and antiemetics before seeking admission.  ? ?Given patient compazine, protonix, famotadine, and dilaudid. Upon reassessment 45 minutes following, patient still continues to endorse 10/10 pain and nausea/vomiting. Will therefore seek admission for same. Patient understanding and amenable with plan.  ? ?Discussed patient with hospitalist who agrees to admit ?  ?Silva Bandy, PA-C ?04/20/21 2109 ? ?  ?Mancel Bale, MD ?04/20/21 2212 ? ?

## 2021-04-20 NOTE — ED Triage Notes (Signed)
Patient reports abdominal pain x2 days, hx of DM1, self CBG monitor is currently 162. Denies recent sick contacts, unable to keep down food or fluids. Denies diarrhea or fevers. Clear emesis in bag.  ?

## 2021-04-20 NOTE — ED Provider Triage Note (Signed)
Emergency Medicine Provider Triage Evaluation Note ? ?Anissa Abbs , a 31 y.o. female  was evaluated in triage.  Pt complains of abdominal pain, v/n, hx gastroparesis, same as previously. ? ?Review of Systems  ?Positive: N/v, abdominal pain ?Negative: Changes in bowel or bladder habits ? ?Physical Exam  ?BP (!) 154/97 (BP Location: Left Arm)   Pulse (!) 108   Temp 98.7 ?F (37.1 ?C)   Resp 20   SpO2 99%  ?Gen:   Awake, no distress , actively vomiting in traige  ?Resp:  Normal effort  ?MSK:   Moves extremities without difficulty  ?Other:   ? ?Medical Decision Making  ?Medically screening exam initiated at 1:29 PM.  Appropriate orders placed.  Matie Dimaano was informed that the remainder of the evaluation will be completed by another provider, this initial triage assessment does not replace that evaluation, and the importance of remaining in the ED until their evaluation is complete. ? ? ?  ?Jeannie Fend, PA-C ?04/20/21 1330 ? ?

## 2021-04-20 NOTE — ED Notes (Signed)
Patient transported to floor at this time.

## 2021-04-20 NOTE — ED Notes (Signed)
Pt blood sugar 162 on her personal monitor ?

## 2021-04-21 ENCOUNTER — Observation Stay (HOSPITAL_COMMUNITY): Payer: 59

## 2021-04-21 ENCOUNTER — Encounter (HOSPITAL_COMMUNITY): Payer: Self-pay | Admitting: Internal Medicine

## 2021-04-21 DIAGNOSIS — E1069 Type 1 diabetes mellitus with other specified complication: Secondary | ICD-10-CM | POA: Diagnosis present

## 2021-04-21 DIAGNOSIS — I152 Hypertension secondary to endocrine disorders: Secondary | ICD-10-CM | POA: Diagnosis present

## 2021-04-21 DIAGNOSIS — E1043 Type 1 diabetes mellitus with diabetic autonomic (poly)neuropathy: Secondary | ICD-10-CM | POA: Diagnosis present

## 2021-04-21 DIAGNOSIS — D649 Anemia, unspecified: Secondary | ICD-10-CM | POA: Diagnosis present

## 2021-04-21 DIAGNOSIS — K3184 Gastroparesis: Secondary | ICD-10-CM | POA: Diagnosis present

## 2021-04-21 DIAGNOSIS — Z794 Long term (current) use of insulin: Secondary | ICD-10-CM | POA: Diagnosis not present

## 2021-04-21 DIAGNOSIS — Z89422 Acquired absence of other left toe(s): Secondary | ICD-10-CM | POA: Diagnosis not present

## 2021-04-21 DIAGNOSIS — Z79899 Other long term (current) drug therapy: Secondary | ICD-10-CM | POA: Diagnosis not present

## 2021-04-21 DIAGNOSIS — E785 Hyperlipidemia, unspecified: Secondary | ICD-10-CM | POA: Diagnosis present

## 2021-04-21 DIAGNOSIS — Z9641 Presence of insulin pump (external) (internal): Secondary | ICD-10-CM | POA: Diagnosis present

## 2021-04-21 DIAGNOSIS — Z20822 Contact with and (suspected) exposure to covid-19: Secondary | ICD-10-CM | POA: Diagnosis present

## 2021-04-21 DIAGNOSIS — E0590 Thyrotoxicosis, unspecified without thyrotoxic crisis or storm: Secondary | ICD-10-CM | POA: Diagnosis present

## 2021-04-21 DIAGNOSIS — K30 Functional dyspepsia: Secondary | ICD-10-CM | POA: Diagnosis present

## 2021-04-21 DIAGNOSIS — L97529 Non-pressure chronic ulcer of other part of left foot with unspecified severity: Secondary | ICD-10-CM | POA: Diagnosis present

## 2021-04-21 DIAGNOSIS — K2100 Gastro-esophageal reflux disease with esophagitis, without bleeding: Secondary | ICD-10-CM | POA: Diagnosis present

## 2021-04-21 DIAGNOSIS — R112 Nausea with vomiting, unspecified: Secondary | ICD-10-CM | POA: Diagnosis not present

## 2021-04-21 DIAGNOSIS — E10621 Type 1 diabetes mellitus with foot ulcer: Secondary | ICD-10-CM | POA: Diagnosis present

## 2021-04-21 DIAGNOSIS — Z833 Family history of diabetes mellitus: Secondary | ICD-10-CM | POA: Diagnosis not present

## 2021-04-21 LAB — COMPREHENSIVE METABOLIC PANEL
ALT: 16 U/L (ref 0–44)
AST: 19 U/L (ref 15–41)
Albumin: 2.7 g/dL — ABNORMAL LOW (ref 3.5–5.0)
Alkaline Phosphatase: 66 U/L (ref 38–126)
Anion gap: 8 (ref 5–15)
BUN: 18 mg/dL (ref 6–20)
CO2: 23 mmol/L (ref 22–32)
Calcium: 8.3 mg/dL — ABNORMAL LOW (ref 8.9–10.3)
Chloride: 105 mmol/L (ref 98–111)
Creatinine, Ser: 0.98 mg/dL (ref 0.44–1.00)
GFR, Estimated: >60 mL/min (ref >60)
Glucose, Bld: 120 mg/dL — ABNORMAL HIGH (ref 70–99)
Potassium: 3.9 mmol/L (ref 3.5–5.1)
Sodium: 136 mmol/L (ref 135–145)
Total Bilirubin: 0.5 mg/dL (ref 0.3–1.2)
Total Protein: 6.1 g/dL — ABNORMAL LOW (ref 6.5–8.1)

## 2021-04-21 LAB — CBC
HCT: 31.9 % — ABNORMAL LOW (ref 36.0–46.0)
Hemoglobin: 10.2 g/dL — ABNORMAL LOW (ref 12.0–15.0)
MCH: 29 pg (ref 26.0–34.0)
MCHC: 32 g/dL (ref 30.0–36.0)
MCV: 90.6 fL (ref 80.0–100.0)
Platelets: 373 10*3/uL (ref 150–400)
RBC: 3.52 MIL/uL — ABNORMAL LOW (ref 3.87–5.11)
RDW: 13.5 % (ref 11.5–15.5)
WBC: 12.8 10*3/uL — ABNORMAL HIGH (ref 4.0–10.5)
nRBC: 0 % (ref 0.0–0.2)

## 2021-04-21 LAB — GLUCOSE, CAPILLARY
Glucose-Capillary: 113 mg/dL — ABNORMAL HIGH (ref 70–99)
Glucose-Capillary: 141 mg/dL — ABNORMAL HIGH (ref 70–99)
Glucose-Capillary: 162 mg/dL — ABNORMAL HIGH (ref 70–99)
Glucose-Capillary: 236 mg/dL — ABNORMAL HIGH (ref 70–99)
Glucose-Capillary: 221 mg/dL — ABNORMAL HIGH (ref 70–99)
Glucose-Capillary: 174 mg/dL — ABNORMAL HIGH (ref 70–99)
Glucose-Capillary: 167 mg/dL — ABNORMAL HIGH (ref 70–99)
Glucose-Capillary: 133 mg/dL — ABNORMAL HIGH (ref 70–99)

## 2021-04-21 LAB — MAGNESIUM: Magnesium: 1.6 mg/dL — ABNORMAL LOW (ref 1.7–2.4)

## 2021-04-21 MED ORDER — HYDROMORPHONE HCL 1 MG/ML IJ SOLN
0.5000 mg | Freq: Once | INTRAMUSCULAR | Status: AC
  Administered 2021-04-21: 0.5 mg via INTRAVENOUS
  Filled 2021-04-21: qty 0.5

## 2021-04-21 MED ORDER — DICYCLOMINE HCL 10 MG/ML IM SOLN
20.0000 mg | Freq: Three times a day (TID) | INTRAMUSCULAR | Status: DC
  Administered 2021-04-21 – 2021-04-23 (×6): 20 mg via INTRAMUSCULAR
  Filled 2021-04-21 (×8): qty 2

## 2021-04-21 MED ORDER — IOHEXOL 300 MG/ML  SOLN
100.0000 mL | Freq: Once | INTRAMUSCULAR | Status: AC | PRN
  Administered 2021-04-21: 100 mL via INTRAVENOUS

## 2021-04-21 MED ORDER — MORPHINE SULFATE (PF) 2 MG/ML IV SOLN
1.0000 mg | INTRAVENOUS | Status: DC | PRN
  Administered 2021-04-21 – 2021-04-22 (×7): 2 mg via INTRAVENOUS
  Filled 2021-04-21 (×7): qty 1

## 2021-04-21 MED ORDER — GABAPENTIN 300 MG PO CAPS
300.0000 mg | ORAL_CAPSULE | Freq: Three times a day (TID) | ORAL | Status: DC
  Administered 2021-04-21 – 2021-04-24 (×8): 300 mg via ORAL
  Filled 2021-04-21 (×9): qty 1

## 2021-04-21 MED ORDER — MAGNESIUM OXIDE -MG SUPPLEMENT 400 (240 MG) MG PO TABS
400.0000 mg | ORAL_TABLET | Freq: Once | ORAL | Status: DC
  Filled 2021-04-21: qty 1

## 2021-04-21 MED ORDER — LACTATED RINGERS IV SOLN: INTRAVENOUS | Status: DC

## 2021-04-21 MED ORDER — SODIUM CHLORIDE (PF) 0.9 % IJ SOLN
INTRAMUSCULAR | Status: AC
  Filled 2021-04-21: qty 50

## 2021-04-21 MED ORDER — PROCHLORPERAZINE EDISYLATE 10 MG/2ML IJ SOLN
10.0000 mg | Freq: Four times a day (QID) | INTRAMUSCULAR | Status: DC | PRN
Start: 2021-04-21 — End: 2021-04-24
  Administered 2021-04-21 – 2021-04-22 (×4): 10 mg via INTRAVENOUS
  Filled 2021-04-21 (×4): qty 2

## 2021-04-21 NOTE — Progress Notes (Addendum)
PROGRESS NOTE    Kelli Hudson  IEP:[id number] DOB: [birth year] DOA: 04/20/2021 PCP: Loyola Mast, MD     Brief Narrative:  31 year old female with a history of type 1 diabetes requiring an insulin pump, hypertension, hyperlipidemia, GERD who presents for nausea and vomiting.  This has been going on for 2 days prior to admission.  She has a history of gastroparesis.  She saw her podiatrist for a left foot ulcer and was prescribed Augmentin.  No fevers or leukocytosis on admission.   New events last 24 hours / Subjective: Patient reports only slight improvement on Reglan and Zofran.  Toradol not helping with her pain.  She is requesting Dilaudid.  Assessment & Plan:   Principal Problem:   Intractable nausea and vomiting  Continue Reglan, change Zofran to Compazine  LR 100 mL/hr  Toradol 30 mg q 6 hrs prn  Single dose of Dilaudid given earlier  Morphine 1-2 mg q 3 hr prn pain  If she requires another dose of morphine after Dilaudid, wil ck CT abd/Pelvis and consult GI  Add bentyl 20 mg TID  Protonix 40 mg/d  Active Problems:   Hypertension associated with diabetes (HCC)  Hold home lisinopril    Hyperlipidemia  Hold home Lipitor while N/V    Diabetic gastroparesis associated with type 1 diabetes mellitus (HCC)  On insulin pump    Type 1 diabetes mellitus with diabetic polyneuropathy (HCC)    Ulcerated, foot, left, with fat layer exposed (HCC)  Will involve wound team    Hypomagnesemia  Replace  Monitor Mg  UPDATE: Discussed case w Dr.Outlaw. No need for consult given hx unless abnormality on imaging. Will ck CT abd/pelvis, add gabapentin.   DVT prophylaxis: Lovenox Code Status: Full Family Communication: Self Coming From: Home Disposition Plan: Probably home Barriers to Discharge: Clinical improvement  Consultants:  None  Procedures:  None  Antimicrobials:  Anti-infectives (From admission, onward)    Start     Dose/Rate Route Frequency Ordered  Stop   04/20/21 2230  Ampicillin-Sulbactam (UNASYN) 3 g in sodium chloride 0.9 % 100 mL IVPB        3 g 200 mL/hr over 30 Minutes Intravenous Every 6 hours 04/20/21 2217          Objective: Vitals:   04/20/21 2337 04/21/21 0143 04/21/21 0410 04/21/21 0720  BP: (!) 150/97 128/77 (!) 142/96 (!) 160/98  Pulse: (!) 121 (!) 106 (!) 110 (!) 109  Resp: 18 18 18 18   Temp: 98.2 F (36.8 C) 98.8 F (37.1 C) 98.2 F (36.8 C) 99.2 F (37.3 C)  TempSrc: Oral Oral Oral Oral  SpO2: 98% 98% 98% 97%  Weight:      Height:        Intake/Output Summary (Last 24 hours) at 04/21/2021 1135 Last data filed at 04/21/2021 0524 Gross per 24 hour  Intake 1249.2 ml  Output --  Net 1249.2 ml   Filed Weights   04/20/21 2322  Weight: 88.2 kg    Examination:  General exam: Appears calm and comfortable  Respiratory system: Clear to auscultation. Respiratory effort normal. No respiratory distress. No conversational dyspnea.  Cardiovascular system: S1 & S2 heard, Reg rhythm, tachycardic. No murmurs. No pedal edema. Gastrointestinal system: Abdomen is nondistended, soft and very tender to light touch. She is guarding.  Central nervous system: Alert and oriented. No focal neurological deficits. Speech clear.  Extremities: Symmetric in appearance  Skin: Left foot shows no excessive warmth, spreading erythema, dry dressing in place  Psychiatry: Judgement and insight appear normal.  Tearful due to pain.  Data Reviewed: I have personally reviewed following labs and imaging studies  CBC: Recent Labs  Lab 04/17/21 0757 04/18/21 1643 04/20/21 1411 04/21/21 0321  WBC 7.2 8.8 9.6 12.8*  NEUTROABS 2.7 4,048  --   --   HGB 10.4* 10.8* 11.9* 10.2*  HCT 31.4* 32.7* 36.5 31.9*  MCV 87.5 88.4 88.4 90.6  PLT 388.0 413* 461* 373   Basic Metabolic Panel: Recent Labs  Lab 04/17/21 0757 04/18/21 1643 04/20/21 1411 04/21/21 0321  NA  --  137 137 136  K  --  4.5 3.6 3.9  CL  --  102 105 105  CO2  --  25 24  23   GLUCOSE 154* 88 149* 120*  BUN  --  32* 19 18  CREATININE  --  1.16* 1.02* 0.98  CALCIUM  --  9.8 9.4 8.3*  MG  --   --   --  1.6*   GFR: Estimated Creatinine Clearance: 98.4 mL/min (by C-G formula based on SCr of 0.98 mg/dL).  Liver Function Tests: Recent Labs  Lab 04/20/21 1411 04/21/21 0321  AST 24 19  ALT 23 16  ALKPHOS 94 66  BILITOT 0.7 0.5  PROT 8.0 6.1*  ALBUMIN 3.6 2.7*   Recent Labs  Lab 04/20/21 1411  LIPASE 27   CBG: Recent Labs  Lab 04/21/21 0718 04/21/21 0830 04/21/21 0831  GLUCAP 113* 141* 162*   Recent Results (from the past 240 hour(s))  Resp Panel by RT-PCR (Flu A&B, Covid) Nasopharyngeal Swab     Status: None   Collection Time: 04/20/21 10:05 PM   Specimen: Nasopharyngeal Swab; Nasopharyngeal(NP) swabs in vial transport medium  Result Value Ref Range Status   SARS Coronavirus 2 by RT PCR NEGATIVE NEGATIVE Final    Comment: (NOTE) SARS-CoV-2 target nucleic acids are NOT DETECTED.  The SARS-CoV-2 RNA is generally detectable in upper respiratory specimens during the acute phase of infection. The lowest concentration of SARS-CoV-2 viral copies this assay can detect is 138 copies/mL. A negative result does not preclude SARS-Cov-2 infection and should not be used as the sole basis for treatment or other patient management decisions. A negative result may occur with  improper specimen collection/handling, submission of specimen other than nasopharyngeal swab, presence of viral mutation(s) within the areas targeted by this assay, and inadequate number of viral copies(<138 copies/mL). A negative result must be combined with clinical observations, patient history, and epidemiological information. The expected result is Negative.  Fact Sheet for Patients:  BloggerCourse.com  Fact Sheet for Healthcare Providers:  SeriousBroker.it  This test is no t yet approved or cleared by the [place]  FDA and  has been authorized for detection and/or diagnosis of SARS-CoV-2 by FDA under an Emergency Use Authorization (EUA). This EUA will remain  in effect (meaning this test can be used) for the duration of the COVID-19 declaration under Section 564(b)(1) of the Act, 21 U.S.C.section 360bbb-3(b)(1), unless the authorization is terminated  or revoked sooner.       Influenza A by PCR NEGATIVE NEGATIVE Final   Influenza B by PCR NEGATIVE NEGATIVE Final    Comment: (NOTE) The Xpert Xpress SARS-CoV-2/FLU/RSV plus assay is intended as an aid in the diagnosis of influenza from Nasopharyngeal swab specimens and should not be used as a sole basis for treatment. Nasal washings and aspirates are unacceptable for Xpert Xpress SARS-CoV-2/FLU/RSV testing.  Fact Sheet for Patients: BloggerCourse.com  Fact Sheet for Healthcare  Providers: SeriousBroker.it  This test is not yet approved or cleared by the [place] and has been authorized for detection and/or diagnosis of SARS-CoV-2 by FDA under an Emergency Use Authorization (EUA). This EUA will remain in effect (meaning this test can be used) for the duration of the COVID-19 declaration under Section 564(b)(1) of the Act, 21 U.S.C. section 360bbb-3(b)(1), unless the authorization is terminated or revoked.  Performed at [facility], [address], [place], [place] [address]       Radiology Studies: None during this admission   Scheduled Meds:  dicyclomine  20 mg Intramuscular TID   enoxaparin (LOVENOX) injection  40 mg Subcutaneous Q24H   insulin pump   Subcutaneous Q4H   magnesium oxide  400 mg Oral Once   metoCLOPramide (REGLAN) injection  10 mg Intravenous Q6H   pantoprazole (PROTONIX) IV  40 mg Intravenous Q24H   sodium chloride flush  3 mL Intravenous Q12H   Continuous Infusions:  ampicillin-sulbactam (UNASYN) IV Stopped (04/21/21 0443)     LOS:  0 days    Time spent: 30 minutes   Sharlene Dory, DO [facility] 04/21/2021, 11:35 AM   Available via Epic secure chat 7am-7pm After these hours, please refer to coverage provider listed on [email or web address]

## 2021-04-21 NOTE — Plan of Care (Signed)
?  Problem: Clinical Measurements: ?Goal: Ability to maintain clinical measurements within normal limits will improve ?Outcome: Progressing ?Goal: Respiratory complications will improve ?Outcome: Progressing ?  ?Problem: Safety: ?Goal: Ability to remain free from injury will improve ?Outcome: Progressing ?  ?Problem: Clinical Measurements: ?Goal: Diagnostic test results will improve ?Outcome: Not Progressing ?  ?Problem: Pain Managment: ?Goal: General experience of comfort will improve ?Outcome: Not Progressing ?  ?

## 2021-04-21 NOTE — Progress Notes (Signed)
Pt states she is in 10/10 pain and the prn morphine and toradol does not help. MD notified. ?

## 2021-04-21 NOTE — Progress Notes (Signed)
At 2010 MD responded saying "this pr also has gabapentin, bentyl and toradol ordered. If any of those can be given try it. Thanks." ? ?Nurse replied saying "When asked she says she did not want those as they did not help either" ? ?At 2014 MD replied "Given she has all those meds there is nothing else I can give her at this time" ? ?Patient notified of MD's response. ?

## 2021-04-21 NOTE — Progress Notes (Signed)
Patient requested to ask the doctor again to see if there is something else she can have for pain. ? ?In response to being asked again about pain medication, MD replied at 2130 stating "As stated before, there is nothing else I can give her." ?

## 2021-04-22 ENCOUNTER — Inpatient Hospital Stay (HOSPITAL_COMMUNITY): Payer: 59

## 2021-04-22 ENCOUNTER — Other Ambulatory Visit: Payer: Self-pay

## 2021-04-22 DIAGNOSIS — E1042 Type 1 diabetes mellitus with diabetic polyneuropathy: Secondary | ICD-10-CM

## 2021-04-22 DIAGNOSIS — E1043 Type 1 diabetes mellitus with diabetic autonomic (poly)neuropathy: Principal | ICD-10-CM

## 2021-04-22 DIAGNOSIS — K3184 Gastroparesis: Secondary | ICD-10-CM

## 2021-04-22 LAB — COMPREHENSIVE METABOLIC PANEL
ALT: 16 U/L (ref 0–44)
AST: 18 U/L (ref 15–41)
Albumin: 2.7 g/dL — ABNORMAL LOW (ref 3.5–5.0)
Alkaline Phosphatase: 69 U/L (ref 38–126)
Anion gap: 9 (ref 5–15)
BUN: 21 mg/dL — ABNORMAL HIGH (ref 6–20)
CO2: 26 mmol/L (ref 22–32)
Calcium: 8.7 mg/dL — ABNORMAL LOW (ref 8.9–10.3)
Chloride: 105 mmol/L (ref 98–111)
Creatinine, Ser: 1.28 mg/dL — ABNORMAL HIGH (ref 0.44–1.00)
GFR, Estimated: 58 mL/min — ABNORMAL LOW (ref >60)
Glucose, Bld: 107 mg/dL — ABNORMAL HIGH (ref 70–99)
Potassium: 3.5 mmol/L (ref 3.5–5.1)
Sodium: 140 mmol/L (ref 135–145)
Total Bilirubin: 0.5 mg/dL (ref 0.3–1.2)
Total Protein: 5.9 g/dL — ABNORMAL LOW (ref 6.5–8.1)

## 2021-04-22 LAB — GLUCOSE, CAPILLARY
Glucose-Capillary: 110 mg/dL — ABNORMAL HIGH (ref 70–99)
Glucose-Capillary: 118 mg/dL — ABNORMAL HIGH (ref 70–99)
Glucose-Capillary: 222 mg/dL — ABNORMAL HIGH (ref 70–99)
Glucose-Capillary: 211 mg/dL — ABNORMAL HIGH (ref 70–99)
Glucose-Capillary: 206 mg/dL — ABNORMAL HIGH (ref 70–99)
Glucose-Capillary: 191 mg/dL — ABNORMAL HIGH (ref 70–99)

## 2021-04-22 LAB — CBC
HCT: 30.8 % — ABNORMAL LOW (ref 36.0–46.0)
Hemoglobin: 10.3 g/dL — ABNORMAL LOW (ref 12.0–15.0)
MCH: 30 pg (ref 26.0–34.0)
MCHC: 33.4 g/dL (ref 30.0–36.0)
MCV: 89.8 fL (ref 80.0–100.0)
Platelets: 372 10*3/uL (ref 150–400)
RBC: 3.43 MIL/uL — ABNORMAL LOW (ref 3.87–5.11)
RDW: 13.5 % (ref 11.5–15.5)
WBC: 12.7 10*3/uL — ABNORMAL HIGH (ref 4.0–10.5)
nRBC: 0 % (ref 0.0–0.2)

## 2021-04-22 LAB — MAGNESIUM: Magnesium: 1.7 mg/dL (ref 1.7–2.4)

## 2021-04-22 LAB — TROPONIN I (HIGH SENSITIVITY)
Troponin I (High Sensitivity): 6 ng/L (ref <18)
Troponin I (High Sensitivity): 6 ng/L (ref <18)

## 2021-04-22 MED ORDER — HYDROMORPHONE HCL 1 MG/ML IJ SOLN
0.5000 mg | INTRAMUSCULAR | Status: DC | PRN
  Administered 2021-04-22 – 2021-04-24 (×9): 1 mg via INTRAVENOUS
  Filled 2021-04-22 (×10): qty 1

## 2021-04-22 MED ORDER — NITROGLYCERIN 0.4 MG SL SUBL: 0.4000 mg | SUBLINGUAL_TABLET | SUBLINGUAL | Status: DC | PRN

## 2021-04-22 MED ORDER — ADULT MULTIVITAMIN W/MINERALS CH
1.0000 | ORAL_TABLET | Freq: Every day | ORAL | Status: DC
  Administered 2021-04-22 – 2021-04-24 (×3): 1 via ORAL
  Filled 2021-04-22 (×3): qty 1

## 2021-04-22 MED ORDER — ENSURE MAX PROTEIN PO LIQD
11.0000 [oz_av] | Freq: Every day | ORAL | Status: DC
  Administered 2021-04-22 – 2021-04-23 (×2): 11 [oz_av] via ORAL
  Filled 2021-04-22 (×3): qty 330

## 2021-04-22 MED ORDER — PANTOPRAZOLE SODIUM 40 MG IV SOLR
40.0000 mg | Freq: Two times a day (BID) | INTRAVENOUS | Status: DC
  Administered 2021-04-22 – 2021-04-24 (×4): 40 mg via INTRAVENOUS
  Filled 2021-04-22 (×4): qty 10

## 2021-04-22 MED ORDER — GLUCERNA SHAKE PO LIQD
237.0000 mL | ORAL | Status: DC
Start: 2021-04-23 — End: 2021-04-24
  Administered 2021-04-23: 237 mL via ORAL
  Filled 2021-04-22 (×2): qty 237

## 2021-04-22 MED ORDER — PROSOURCE PLUS PO LIQD
30.0000 mL | Freq: Every day | ORAL | Status: DC
  Administered 2021-04-22 – 2021-04-23 (×2): 30 mL via ORAL
  Filled 2021-04-22 (×2): qty 30

## 2021-04-22 MED ORDER — INSULIN ASPART 100 UNIT/ML IJ SOLN: 0.0000 [IU] | Freq: Every day | INTRAMUSCULAR | Status: DC

## 2021-04-22 MED ORDER — INSULIN ASPART 100 UNIT/ML IJ SOLN: 0.0000 [IU] | Freq: Three times a day (TID) | INTRAMUSCULAR | Status: DC

## 2021-04-22 MED ORDER — ALUM & MAG HYDROXIDE-SIMETH 200-200-20 MG/5ML PO SUSP
30.0000 mL | Freq: Four times a day (QID) | ORAL | Status: DC | PRN
  Administered 2021-04-22: 30 mL via ORAL
  Filled 2021-04-22 (×2): qty 30

## 2021-04-22 NOTE — Consult Note (Signed)
Consultation  Referring Provider: Dr. Starla Link     Primary Care Physician:  [name], MD Primary Gastroenterologist:   Dr. Havery Moros      Reason for Consultation:   Gastroparesis         HPI:   Kelli Hudson is a 31 y.o. female with a past medical history as listed below including diabetes and gastroparesis, who presented to the hospital on 04/20/2021 with a complaint of persistent nausea and vomiting.    At time of admission patient described 2 days of persistent nausea and vomiting with abdominal pain which was similar to prior exacerbations of gastroparesis.  Symptoms began after eating out at a restaurant and eating some fried pickles and chicken wings.  Apparently some blood in her initial emesis which had cleared up.  Also generalized abdominal discomfort and retrosternal burning sensation.  Does have history of known wounds/alternant on her left foot which she is following with wound care and podiatry.  Started Augmentin 3/2 for some swelling in the area.    At time my interview today patient is unable to really answer any of my questions due to the severity of her pain.  The nursing staff are getting ready to give her Dilaudid per her request.  Nursing staff directly to her mother who can provide more of a history.    I called patient's mother, she reports that she does not live with the patient but she thought she was following regularly with a gastroenterologist.  Tells me that she does take Reglan but is unsure of dosage and is unsure of times of day.  (When I was in the room patient told me she takes twice a day), apparently she has just been restarted on this.  Patient's mother tells me she hardly eats anything and just eats 2 times a day.  Relays some frustration in regards to visits they have had with dietitians since the patient has diabetes as well as gastroparesis that is hard for her to manage.  Patient's mother tells me the only thing that helps is time when the  patient is admitted to the hospital and she does require Dilaudid which is the only pain medicine that gives her some relief.  Mom tells me she is aware of the side effects of slowing digestion with the Dilaudid but tells me that her daughter just needs relief and time for her stomach to rest and is aware of the side effects.      Patient denies fever or chills.  ED course: Labs with a white count 9.6, hemoglobin 11.9, lipase 27, received IV Pepcid, Protonix, Zofran, Reglan, Compazine and Dilaudid in the ER; 04/21/2021 CTAP with distended appendix but without associated inflammation similar to prior CT  GI history: 11/12/2020-11/15/2020 patient admitted at [facility] for gastroparesis apparently demanded Dilaudid at that hospitalization and was eventually given 1 mg every 4 hours, she eventually demanded increase in Dilaudid against GI recommendations and then left [facility] and had continued vomiting when they would not give this to her 05/16/2020 gastric emptying study was severely delayed gastric emptying 05/11/2020-05/14/2020 admission for uncontrolled gastroparesis: At that time noted poorly controlled diabetes and history of nausea and vomiting admitted with refractory nausea and vomiting, had previous EGD with [facility] when admitted in December which did not show any overt pathology and biopsies were negative for H. pylori, then seen in our office and it was thought gastroparesis was causing symptoms, doing better on low-dose Reglan and Protonix,  at that time IV erythromycin was added empirically and she was continued on Reglan 10 mg every 8 hours as well as IV Protonix  Past Medical History:  Diagnosis Date   Acute H. pylori gastric ulcer    Diabetes mellitus ([place])    Diabetic gastroparesis ([place])    Gastroparesis    GERD (gastroesophageal reflux disease)    Hypertension    Hyperthyroidism     Past Surgical History:  Procedure Laterality Date   AMPUTATION TOE Left 03/10/2018    Procedure: AMPUTATION FIFTH TOE;  Surgeon: Trula Slade, DPM;  Location: [facility];  Service: Podiatry;  Laterality: Left;   BIOPSY  01/28/2020   Procedure: BIOPSY;  Surgeon: Otis Brace, MD;  Location: [facility];  Service: Gastroenterology;;   ESOPHAGOGASTRODUODENOSCOPY N/A 01/28/2020   Procedure: ESOPHAGOGASTRODUODENOSCOPY (EGD);  Surgeon: Otis Brace, MD;  Location: [facility];  Service: Gastroenterology;  Laterality: N/A;   ESOPHAGOGASTRODUODENOSCOPY (EGD) WITH PROPOFOL Left 09/08/2015   Procedure: ESOPHAGOGASTRODUODENOSCOPY (EGD) WITH PROPOFOL;  Surgeon: Arta Silence, MD;  Location: [facility] ENDOSCOPY;  Service: Endoscopy;  Laterality: Left;   WISDOM TOOTH EXTRACTION      Family History  Problem Relation Age of Onset   Lung cancer Mother    Diabetes Mother    Bipolar disorder Father    Liver cancer Maternal Grandfather    Breast cancer Other        maternal great aunt   Heart disease Other    Ovarian cancer Other        maternal great aunt   Pancreatic cancer Maternal Uncle    Prostate cancer Maternal Uncle    Diabetes Maternal Aunt        x 2   Kidney disease Other        maternal great aunt   Social History   Tobacco Use   Smoking status: Never   Smokeless tobacco: Never  Vaping Use   Vaping Use: Never used  Substance Use Topics   Alcohol use: Yes    Alcohol/week: 1.0 standard drink    Types: 1 Shots of liquor per week    Comment: occasional    Drug use: No    Prior to Admission medications   Medication Sig Start Date End Date Taking? Authorizing Provider  albuterol (VENTOLIN HFA) 108 (90 Base) MCG/ACT inhaler Inhale 2 puffs into the lungs every 6 (six) hours as needed for shortness of breath.   Yes [provider]  amoxicillin-clavulanate (AUGMENTIN) 875-125 MG tablet Take 1 tablet by mouth 2 (two) times daily. Patient taking differently: Take 1 tablet by mouth 2 (two) times daily. Start date  :04/19/21 04/18/21  Yes Trula Slade, DPM  atorvastatin (LIPITOR) 20 MG tablet Take 1 tablet (20 mg total) by mouth daily. 04/10/21  Yes [name], MD  famotidine (PEPCID) 20 MG tablet TAKE 1 TABLET(20 MG) BY MOUTH TWICE DAILY Patient taking differently: Take 20 mg by mouth 2 (two) times daily. 04/01/21  Yes [name], MD  hyoscyamine (LEVBID) 0.375 MG 12 hr tablet Take 1 tablet (0.375 mg total) by mouth every 12 (twelve) hours as needed for cramping (abdominal pain). 05/14/20  Yes Sheikh, Omair Latif, DO  insulin regular (NOVOLIN R) 100 units/mL injection Inject 0-0.15 mLs (0-15 Units total) into the skin 3 (three) times daily before meals. Patient taking differently: Inject 0-15 Units into the skin See admin instructions. Via pump - Patient not sure about the dosage. 01/30/21  Yes Shamleffer, Melanie Crazier, MD  lisinopril (  ZESTRIL) 10 MG tablet Take 1 tablet (10 mg total) by mouth daily. 09/27/20  Yes [name], MD  metoCLOPramide (REGLAN) 10 MG tablet Take 1 tablet (10 mg total) by mouth 3 (three) times daily before meals. Patient taking differently: Take 10 mg by mouth every 8 (eight) hours as needed for vomiting or nausea. 02/28/21 04/20/21 Yes Donne Hazel, MD  pantoprazole (PROTONIX) 40 MG tablet Take 40 mg by mouth daily. 09/17/20  Yes [provider]  promethazine (PHENERGAN) 12.5 MG tablet Take 1 tablet (12.5 mg total) by mouth every 6 (six) hours as needed for nausea or vomiting. 05/14/20  Yes Sheikh, Omair Latif, DO  Blood Glucose Monitoring Suppl (TRUE METRIX METER) w/Device KIT 1 Device by Does not apply route 4 (four) times daily. 07/05/19   Libby Maw, MD  calcium gluconate 500 MG tablet Take 1 tablet (500 mg total) by mouth 3 (three) times daily. Patient not taking: Reported on 04/20/2021 09/24/20   [name], MD  collagenase (SANTYL) ointment Apply 1 application topically daily. Patient not taking: Reported on 04/20/2021 01/22/21   Trula Slade, DPM  Continuous Blood Gluc Receiver (DEXCOM G6 RECEIVER) [place] USE AS DIRECTED Patient taking differently: 1 each by Other route as directed. 11/21/20   [name], MD  Continuous Blood Gluc Sensor (DEXCOM G6 SENSOR) MISC 1 each by Does not apply route daily. 11/21/20   [name], MD  Continuous Blood Gluc Transmit (DEXCOM G6 TRANSMITTER) MISC 1 each by Does not apply route daily. 09/24/20   [name], MD  glucose blood (TRUE METRIX BLOOD GLUCOSE TEST) test strip Use as instructed 09/03/20   [name], MD  Insulin Pen Needle 31G X 5 MM MISC 1 Device by Does not apply route QID. For use with insulin pens 07/05/19   Libby Maw, MD  TRUEPLUS LANCETS 28G MISC assist with checking blood sugar TID and qhs Patient taking differently: 1 each by Other route See admin instructions. assist with checking blood sugar TID and qhs 09/10/15   Zettie Pho S, PA-C  insulin aspart (NOVOLOG) 100 UNIT/ML FlexPen Inject 5 Units into the skin 3 (three) times daily with meals. 02/23/18 07/05/19  Donne Hazel, MD    Current Facility-Administered Medications  Medication Dose Route Frequency Provider Last Rate Last Admin   acetaminophen (TYLENOL) tablet 650 mg  650 mg Oral Q6H PRN Lenore Cordia, MD       Or   acetaminophen (TYLENOL) suppository 650 mg  650 mg Rectal Q6H PRN Lenore Cordia, MD       albuterol (PROVENTIL) (2.5 MG/3ML) 0.083% nebulizer solution 2.5 mg  2.5 mg Nebulization Q6H PRN Lenore Cordia, MD       alum & mag hydroxide-simeth (MAALOX/MYLANTA) 200-200-20 MG/5ML suspension 30 mL  30 mL Oral Q6H PRN Alekh, Kshitiz, MD       Ampicillin-Sulbactam (UNASYN) 3 g in sodium chloride 0.9 % 100 mL IVPB  3 g Intravenous Q6H Patel, Vishal R, MD 200 mL/hr at 04/22/21 1053 3 g at 04/22/21 1053   dicyclomine (BENTYL) injection 20 mg  20 mg Intramuscular TID Shelda Pal, DO   20 mg at 04/22/21 1052   enoxaparin (LOVENOX) injection 40 mg  40 mg Subcutaneous  Q24H Zada Finders R, MD   40 mg at 04/21/21 2158   gabapentin (NEURONTIN) capsule 300 mg  300 mg Oral TID Shelda Pal, DO   300 mg at 04/22/21  1052   insulin aspart (novoLOG) injection 0-15 Units  0-15 Units Subcutaneous TID WC Alekh, Kshitiz, MD       insulin aspart (novoLOG) injection 0-5 Units  0-5 Units Subcutaneous QHS Aline August, MD       insulin pump   Subcutaneous Q4H Lenore Cordia, MD   Given at 04/21/21 2003   ketorolac (TORADOL) 30 MG/ML injection 30 mg  30 mg Intravenous Q6H PRN Lenore Cordia, MD   30 mg at 04/22/21 1043   labetalol (NORMODYNE) injection 10 mg  10 mg Intravenous Q4H PRN Lenore Cordia, MD   10 mg at 04/21/21 1817   lactated ringers infusion   Intravenous Continuous Shelda Pal, DO 100 mL/hr at 04/21/21 1340 New Bag at 04/21/21 1340   magnesium oxide (MAG-OX) tablet 400 mg  400 mg Oral Once Shelda Pal, DO       metoCLOPramide (REGLAN) injection 10 mg  10 mg Intravenous Q6H Zada Finders R, MD   10 mg at 04/22/21 0525   morphine (PF) 2 MG/ML injection 1-2 mg  1-2 mg Intravenous Q3H PRN Shelda Pal, DO   2 mg at 04/22/21 1043   nitroGLYCERIN (NITROSTAT) SL tablet 0.4 mg  0.4 mg Sublingual Q5 min PRN Aline August, MD       pantoprazole (PROTONIX) injection 40 mg  40 mg Intravenous Q12H Aline August, MD       prochlorperazine (COMPAZINE) injection 10 mg  10 mg Intravenous Q6H PRN Shelda Pal, DO   10 mg at 04/22/21 1042   sodium chloride flush (NS) 0.9 % injection 3 mL  3 mL Intravenous Q12H Lenore Cordia, MD   3 mL at 04/22/21 1052    Allergies as of 04/20/2021   (No Known Allergies)     Review of Systems:    Constitutional: No weight loss, fever or chills Skin: No rash Cardiovascular: No chest pain Respiratory: No SOB Gastrointestinal: See HPI and otherwise negative Genitourinary: No dysuria  Neurological: No headache, dizziness or syncope Musculoskeletal: No new muscle  or joint pain Hematologic: No bleeding Psychiatric: No history of depression or anxiety    Physical Exam:  Vital signs in last 24 hours: Temp:  [97.8 F (36.6 C)-99.3 F (37.4 C)] 99 F (37.2 C) (03/06 1313) Pulse Rate:  [104-127] 117 (03/06 1313) Resp:  [18-24] 24 (03/06 1313) BP: (95-192)/(56-126) 192/126 (03/06 1313) SpO2:  [96 %-99 %] 98 % (03/06 1313) Last BM Date : 04/21/21 General:   AA female appears to be in mild distress, Well developed, Well nourished, alert and unable to cooperate due to pain Head:  Normocephalic and atraumatic. Eyes:   PEERL, EOMI. No icterus. Conjunctiva pink. Ears:  Normal auditory acuity. Neck:  Supple Throat: Oral cavity and pharynx without inflammation, swelling or lesion.  Lungs: Respirations even and unlabored. Lungs clear to auscultation bilaterally.   No wheezes, crackles, or rhonchi.  Heart: Normal S1, S2. No MRG. Regular rate and rhythm. No peripheral edema, cyanosis or pallor.  Abdomen:  Soft, nondistended, Marked ttp to only light palpation over abdomen with involuntary guarding, decreased bowel sounds all 4 quadrants no appreciable masses or hepatomegaly. Rectal:  Not performed.  Msk:  Symmetrical without gross deformities. Peripheral pulses intact.  Extremities:  Without edema, no deformity or joint abnormality.  Neurologic:  Alert;  grossly normal neurologically.  Skin:   Dry and intact without significant lesions or rashes. Psychiatric: Answers questions with one-word answers, seems to be in a lot  of pain   LAB RESULTS: Recent Labs    04/20/21 1411 04/21/21 0321 04/22/21 0325  WBC 9.6 12.8* 12.7*  HGB 11.9* 10.2* 10.3*  HCT 36.5 31.9* 30.8*  PLT 461* 373 372   BMET Recent Labs    04/20/21 1411 04/21/21 0321 04/22/21 0325  NA 137 136 140  K 3.6 3.9 3.5  CL 105 105 105  CO2 24 23 26   GLUCOSE 149* 120* 107*  BUN 19 18 21*  CREATININE 1.02* 0.98 1.28*  CALCIUM 9.4 8.3* 8.7*   LFT Recent Labs    04/22/21 0325   PROT 5.9*  ALBUMIN 2.7*  AST 18  ALT 16  ALKPHOS 69  BILITOT 0.5   STUDIES: CT ABDOMEN PELVIS W CONTRAST  Result Date: 04/21/2021 CLINICAL DATA:  Abdominal pain.  Nausea vomiting. EXAM: CT ABDOMEN AND PELVIS WITH CONTRAST TECHNIQUE: Multidetector CT imaging of the abdomen and pelvis was performed using the standard protocol following bolus administration of intravenous contrast. RADIATION DOSE REDUCTION: This exam was performed according to the departmental dose-optimization program which includes automated exposure control, adjustment of the mA and/or kV according to patient size and/or use of iterative reconstruction technique. CONTRAST:  156m OMNIPAQUE IOHEXOL 300 MG/ML  SOLN COMPARISON:  11/15/2020. FINDINGS: Lower chest: Clear lung bases. Hepatobiliary: No focal liver abnormality is seen. No gallstones, gallbladder wall thickening, or biliary dilatation. Pancreas: Unremarkable. No pancreatic ductal dilatation or surrounding inflammatory changes. Spleen: Normal in size without focal abnormality. Adrenals/Urinary Tract: Adrenal glands are unremarkable. Kidneys are normal, without renal calculi, focal lesion, or hydronephrosis. Bladder is unremarkable. Stomach/Bowel: The appendix extends posteriorly and medially from the cecal tip, into the right pelvis. Along its mid to distal portion is dilated to a maximum of 1.3 cm. However, it contains air and there is no adjacent inflammation. It is similar in appearance to the prior CT. This is suspected to be a normal variant in this patient. Normal stomach. Small bowel and colon are normal in caliber. No bowel wall thickening or inflammation. Vascular/Lymphatic: No significant vascular findings are present. No enlarged abdominal or pelvic lymph nodes. Reproductive: Uterus and bilateral adnexa are unremarkable. Other: No abdominal wall hernia or abnormality. No abdominopelvic ascites. Musculoskeletal: No acute or significant osseous findings. IMPRESSION: 1. No  acute findings. No findings to account for the patient's symptoms. 2. Distended appendix as detailed above, but without associated inflammation, and similar to the prior CT. This is felt be a normal variant in this patient. Electronically Signed   By: DLajean ManesM.D.   On: 04/21/2021 14:47     Impression / Plan:   Impression: 1.  Gastroparesis: With abdominal pain and nausea/vomiting, has been admitted to the hospital twice over the past year for the same thing, [place] not [place], seems to only be taking her Reglan twice daily (unsure of dosage), EGD in December 2021 with gastritis and otherwise normal, flare started acutely 24 hours ago, last hemoglobin A1c 9.9 on 02/25/2021  Plan: 1.  Discussed with patient and her mother that the goal will be to decrease narcotics while patient is in the hospital.  Mother is very concerned due to the severity of her daughter's pain and tells me that the only thing that is ever worked during her 2 hospital admissions is Dilaudid and Reglan and "time".  She does not want her daughter sitting and writhing in pain and they are aware of the side effects of worsened gastroparesis. 2.  Discussed with patient  and her mother that the patient needs to have regular follow-ups with [place] and also needs to increase her regular dose of Reglan. 3.  Agree with full liquid diet as tolerated for now 4.  Okay with Dilaudid for now, this patient and her mother are aware of side effects 5.  Agree with Reglan 10 mg every 6 hours 6.  If patient is not doing any better over the next 1 to 2 days could add in Erythromycin as was done previously at her hospital stay in March of last year 7.  Patient will need scheduled follow-up with our clinic at time of discharge (she previously saw Amy)  Thank you for your kind consultation, we will continue to follow.  Lavone Nian Sabine Tenenbaum  04/22/2021, 1:27 PM

## 2021-04-22 NOTE — Progress Notes (Signed)
Patients insulin sensor for insulin pump expired. Patient says she will have her boyfriend bring her another sensory, but in the mean time requires [place] to give insulin. MD notified. ?

## 2021-04-22 NOTE — Progress Notes (Addendum)
PROGRESS NOTE    Kelli Hudson  CVE:[id number] DOB: October 02, 1990 DOA: 04/20/2021 PCP: Loyola Mast, MD   Brief Narrative:  31 year old female with a history of type 1 diabetes requiring an insulin pump, hypertension, hyperlipidemia, GERD and gastroparesis presented with nausea and vomiting.  She was admitted for intractable nausea and vomiting and started on IV fluids and antiemetics.  Assessment & Plan:   Intractable nausea and vomiting with abdominal pain, possibly from diabetic gastroparesis -Currently NPO.  Symptoms are improving.  CT of abdomen and pelvis was negative for any acute findings.  Would put her on full liquid diet and advance diet as tolerated. -Continue around-the-clock Reglan along with as needed Compazine.  Continue IV fluids. -Continue as needed Toradol.  Continue Bentyl and Protonix.  Narcotics would aggravate diabetic gastroparesis: Hence use it with caution.  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  Consult diabetes coordinator.  Hypertension -Monitor blood pressure.  Lisinopril on hold  Hyperlipidemia -Statin on hold.  LDL 140  Hypomagnesemia -Improved  Left foot ulcer: Present on admission -Possibly from trauma from shoe.  Follow wound care consult recommendations   DVT prophylaxis: Lovenox Code Status: Full Family Communication: Mother on phone on 04/22/21 Disposition Plan: Status is: Inpatient Remains inpatient appropriate because: Of severity of illness.  Need for IV fluids    Consultants: None  Procedures: None  Antimicrobials: None   Subjective: Patient seen and examined at bedside.  Feels slightly better and wants to try some liquid diet today.  No overnight fever, chest pain, shortness of breath reported.  Still intermittently nauseous with intermittent abdominal pain reported. Objective: Vitals:   04/21/21 2001 04/21/21 2332 04/22/21 0423 04/22/21 0900  BP: (!) 160/99 (!) 95/56 138/86 120/90   Pulse: (!) 123 (!) 106 (!) 104 (!) 108  Resp: 18 18 18    Temp: 99.3 F (37.4 C) 97.8 F (36.6 C) 98.4 F (36.9 C) 98 F (36.7 C)  TempSrc: Oral Oral Oral Oral  SpO2: 99% 98% 98% 96%  Weight:      Height:        Intake/Output Summary (Last 24 hours) at 04/22/2021 1110 Last data filed at 04/22/2021 06/22/2021 Gross per 24 hour  Intake 1503 ml  Output --  Net 1503 ml   Filed Weights   04/20/21 2322  Weight: 88.2 kg    Examination:  General exam: Appears calm and comfortable.  Currently on room air. Respiratory system: Bilateral decreased breath sounds at bases Cardiovascular system: S1 & S2 heard, Rate controlled Gastrointestinal system: Abdomen is nondistended, soft and mildly tender in the periumbilical region.  Normal bowel sounds heard. Extremities: No cyanosis, clubbing, edema  Central nervous system: Alert and oriented. No focal neurological deficits. Moving extremities Skin: No obvious petechiae/rashes Psychiatry: Affect is mostly flat.  No signs of agitation.   Data Reviewed: I have personally reviewed following labs and imaging studies  CBC: Recent Labs  Lab 04/17/21 0757 04/18/21 1643 04/20/21 1411 04/21/21 0321 04/22/21 0325  WBC 7.2 8.8 9.6 12.8* 12.7*  NEUTROABS 2.7 4,048  --   --   --   HGB 10.4* 10.8* 11.9* 10.2* 10.3*  HCT 31.4* 32.7* 36.5 31.9* 30.8*  MCV 87.5 88.4 88.4 90.6 89.8  PLT 388.0 413* 461* 373 372   Basic Metabolic Panel: Recent Labs  Lab 04/17/21 0757 04/18/21 1643 04/20/21 1411 04/21/21 0321 04/22/21 0325  NA  --  137 137 136 140  K  --  4.5 3.6 3.9 3.5  CL  --  102 105 105 105  CO2  --  25 24 23 26   GLUCOSE 154* 88 149* 120* 107*  BUN  --  32* 19 18 21*  CREATININE  --  1.16* 1.02* 0.98 1.28*  CALCIUM  --  9.8 9.4 8.3* 8.7*  MG  --   --   --  1.6* 1.7   GFR: Estimated Creatinine Clearance: 75.3 mL/min (A) (by C-G formula based on SCr of 1.28 mg/dL (H)). Liver Function Tests: Recent Labs  Lab 04/20/21 1411 04/21/21 0321  04/22/21 0325  AST 24 19 18   ALT 23 16 16   ALKPHOS 94 66 69  BILITOT 0.7 0.5 0.5  PROT 8.0 6.1* 5.9*  ALBUMIN 3.6 2.7* 2.7*   Recent Labs  Lab 04/20/21 1411  LIPASE 27   No results for input(s): AMMONIA in the last 168 hours. Coagulation Profile: No results for input(s): INR, PROTIME in the last 168 hours. Cardiac Enzymes: No results for input(s): CKTOTAL, CKMB, CKMBINDEX, TROPONINI in the last 168 hours. BNP (last 3 results) No results for input(s): PROBNP in the last 8760 hours. HbA1C: No results for input(s): HGBA1C in the last 72 hours. CBG: Recent Labs  Lab 04/21/21 1958 04/21/21 2044 04/21/21 2328 04/22/21 0421 04/22/21 0727  GLUCAP 174* 167* 133* 110* 118*   Lipid Profile: No results for input(s): CHOL, HDL, LDLCALC, TRIG, CHOLHDL, LDLDIRECT in the last 72 hours. Thyroid Function Tests: No results for input(s): TSH, T4TOTAL, FREET4, T3FREE, THYROIDAB in the last 72 hours. Anemia Panel: No results for input(s): VITAMINB12, FOLATE, FERRITIN, TIBC, IRON, RETICCTPCT in the last 72 hours. Sepsis Labs: No results for input(s): PROCALCITON, LATICACIDVEN in the last 168 hours.  Recent Results (from the past 240 hour(s))  Resp Panel by RT-PCR (Flu A&B, Covid) Nasopharyngeal Swab     Status: None   Collection Time: 04/20/21 10:05 PM   Specimen: Nasopharyngeal Swab; Nasopharyngeal(NP) swabs in vial transport medium  Result Value Ref Range Status   SARS Coronavirus 2 by RT PCR NEGATIVE NEGATIVE Final    Comment: (NOTE) SARS-CoV-2 target nucleic acids are NOT DETECTED.  The SARS-CoV-2 RNA is generally detectable in upper respiratory specimens during the acute phase of infection. The lowest concentration of SARS-CoV-2 viral copies this assay can detect is 138 copies/mL. A negative result does not preclude SARS-Cov-2 infection and should not be used as the sole basis for treatment or other patient management decisions. A negative result may occur with  improper  specimen collection/handling, submission of specimen other than nasopharyngeal swab, presence of viral mutation(s) within the areas targeted by this assay, and inadequate number of viral copies(<138 copies/mL). A negative result must be combined with clinical observations, patient history, and epidemiological information. The expected result is Negative.  Fact Sheet for Patients:  BloggerCourse.com  Fact Sheet for Healthcare Providers:  SeriousBroker.it  This test is no t yet approved or cleared by the [place] FDA and  has been authorized for detection and/or diagnosis of SARS-CoV-2 by FDA under an Emergency Use Authorization (EUA). This EUA will remain  in effect (meaning this test can be used) for the duration of the COVID-19 declaration under Section 564(b)(1) of the Act, 21 U.S.C.section 360bbb-3(b)(1), unless the authorization is terminated  or revoked sooner.       Influenza A by PCR NEGATIVE NEGATIVE Final   Influenza B by PCR NEGATIVE NEGATIVE Final    Comment: (NOTE) The Xpert Xpress SARS-CoV-2/FLU/RSV plus assay is intended as an aid in the diagnosis of influenza from Nasopharyngeal  swab specimens and should not be used as a sole basis for treatment. Nasal washings and aspirates are unacceptable for Xpert Xpress SARS-CoV-2/FLU/RSV testing.  Fact Sheet for Patients: BloggerCourse.com  Fact Sheet for Healthcare Providers: SeriousBroker.it  This test is not yet approved or cleared by the [place] FDA and has been authorized for detection and/or diagnosis of SARS-CoV-2 by FDA under an Emergency Use Authorization (EUA). This EUA will remain in effect (meaning this test can be used) for the duration of the COVID-19 declaration under Section 564(b)(1) of the Act, 21 U.S.C. section 360bbb-3(b)(1), unless the authorization is terminated or revoked.  Performed at  [facility], [address], [place], [place] [address]          Radiology Studies: CT ABDOMEN PELVIS W CONTRAST  Result Date: 04/21/2021 CLINICAL DATA:  Abdominal pain.  Nausea vomiting. EXAM: CT ABDOMEN AND PELVIS WITH CONTRAST TECHNIQUE: Multidetector CT imaging of the abdomen and pelvis was performed using the standard protocol following bolus administration of intravenous contrast. RADIATION DOSE REDUCTION: This exam was performed according to the departmental dose-optimization program which includes automated exposure control, adjustment of the mA and/or kV according to patient size and/or use of iterative reconstruction technique. CONTRAST:  OMNIPAQUE IOHEXOL 300 MG/ML  SOLN COMPARISON:  11/15/2020. FINDINGS: Lower chest: Clear lung bases. Hepatobiliary: No focal liver abnormality is seen. No gallstones, gallbladder wall thickening, or biliary dilatation. Pancreas: Unremarkable. No pancreatic ductal dilatation or surrounding inflammatory changes. Spleen: Normal in size without focal abnormality. Adrenals/Urinary Tract: Adrenal glands are unremarkable. Kidneys are normal, without renal calculi, focal lesion, or hydronephrosis. Bladder is unremarkable. Stomach/Bowel: The appendix extends posteriorly and medially from the cecal tip, into the right pelvis. Along its mid to distal portion is dilated to a maximum of 1.3 cm. However, it contains air and there is no adjacent inflammation. It is similar in appearance to the prior CT. This is suspected to be a normal variant in this patient. Normal stomach. Small bowel and colon are normal in caliber. No bowel wall thickening or inflammation. Vascular/Lymphatic: No significant vascular findings are present. No enlarged abdominal or pelvic lymph nodes. Reproductive: Uterus and bilateral adnexa are unremarkable. Other: No abdominal wall hernia or abnormality. No abdominopelvic ascites. Musculoskeletal: No acute or significant osseous  findings. IMPRESSION: 1. No acute findings. No findings to account for the patient's symptoms. 2. Distended appendix as detailed above, but without associated inflammation, and similar to the prior CT. This is felt be a normal variant in this patient. Electronically Signed   By: Amie Portland M.D.   On: 04/21/2021 14:47        Scheduled Meds:  dicyclomine  20 mg Intramuscular TID   enoxaparin (LOVENOX) injection  40 mg Subcutaneous Q24H   gabapentin  300 mg Oral TID   insulin pump   Subcutaneous Q4H   magnesium oxide  400 mg Oral Once   metoCLOPramide (REGLAN) injection  10 mg Intravenous Q6H   pantoprazole (PROTONIX) IV  40 mg Intravenous Q24H   sodium chloride flush  3 mL Intravenous Q12H   Continuous Infusions:  ampicillin-sulbactam (UNASYN) IV 3 g (04/22/21 1053)   lactated ringers 100 mL/hr at 04/21/21 1340          Aden Youngman, MD [facility] 04/22/2021, 11:10 AM

## 2021-04-22 NOTE — Progress Notes (Signed)
Subjective: ?31 year old female presents the office today for concerns of wound on her left foot that still present.  Her primary care physician and had blood work which did reveal elevated sed rate and concern for infection.  She has been at the wound care center since December.  No wounds are healing but concerned that swelling present.  She has no fevers or chills.  She has no other concerns. ? ?Objective: ?AAO x3, NAD ?DP/PT pulses palpable bilaterally, CRT less than 3 seconds ?Sensation decreased ?On the lateral aspect of the left foot on the fifth metatarsal base is a granular wound with hyperkeratotic periwound.  There is no probing to bone, no tunneling.  Also granular wound on the medial aspect the foot again without any probing, undermining or tunneling.  There is swelling present to the foot and faint increased temperature compared to contra lower extremity.  There is no fluctuation or crepitation. ?No pain with calf compression, swelling, warmth, erythema ? ? ? ? ? ? ?Assessment: ?Ulcerations left foot with swelling/concern for infection ? ?Plan: ?-All treatment options discussed with the patient including all alternatives, risks, complications.  ?-X-rays reviewed.  No evidence of acute fracture or osteomyelitis today. ?-Lightly debrided the wounds today with a #312 with scalpel, healthy, bleeding tissue without any complications.  She tolerated the procedure well.  Given concern for infection will start Augmentin.  I do want to recheck blood work including CRP, BMP, CBC, sed rate. ?-Continue to follow-up with the wound care center as well. ?-Monitor for any clinical signs or symptoms of infection and directed to call the office immediately should any occur or go to the ER. ?-Patient encouraged to call the office with any questions, concerns, change in symptoms.  ? ?Return in about 1 week (around 04/25/2021). ? ?Vivi Barrack DPM ? ? ?

## 2021-04-22 NOTE — Consult Note (Signed)
WOC Nurse Consult Note: ?Patient receiving care in [facility]. ?Reason for Consult: left foot wound ?Wound type: healing trauma injury from a shoe rubbing the mid lateral side of the left foot. The patient is a diabetic. ?Pressure Injury POA: NA ?Measurement: 1 cm x 2 cm ?Wound bed: pink, just needs to re-epithelialize ?Drainage (amount, consistency, odor) none ?Periwound: scarred, intact ?Dressing procedure/placement/frequency: ?Place a small foam dressing over the dry, flaking skin on the dorsum of the left foot. Change every 3 - 5 day. ? ?For the left lateral mid-foot wound, place a small piece of vaseline gauze over the wound, then a small foam dressing. Change every 3 days. ? ?Monitor the wound area(s) for worsening of condition such as: ?Signs/symptoms of infection,  ?Increase in size,  ?Development of or worsening of odor, ?Development of pain, or increased pain at the affected locations.  Notify the medical team if any of these develop. ? ?Thank you for the consult.  Discussed plan of care with the patient.  [facility] nurse will not follow at this time.  Please re-consult the [facility] team if needed. ? ?Val Riles, RN, MSN, CWOCN, CNS-BC, pager [phone]  ?

## 2021-04-22 NOTE — Progress Notes (Signed)
Inpatient Diabetes Program Recommendations ? ?AACE/ADA: New Consensus Statement on Inpatient Glycemic Control (2015) ? ?Target Ranges:  Prepandial:   less than 140 mg/dL ?     Peak postprandial:   less than 180 mg/dL (1-2 hours) ?     Critically ill patients:  140 - 180 mg/dL  ? ?Lab Results  ?Component Value Date  ? GLUCAP 222 (H) 04/22/2021  ? HGBA1C 9.9 (H) 02/25/2021  ? ? ?Review of Glycemic Control ? ?Diabetes history: DM1 ?Outpatient Diabetes medications: insulin pump ?Current orders for Inpatient glycemic control: Insulin pump orders ?Novolog 0-15 units TID with meals and 0-5 HS ? ?Pump settings: ?Basal - 0.5/H ?Bolus CHO ratio 1:12 ?CF 50 with goal of 110 mg/dL ? ?Endo - [place] ? ?Inpatient Diabetes Program Recommendations:   ? ?Pump is not connected at present. ?Pt does not have supplies she needs to restart pump. Has asked BF to bring the supplies. This Coordinator also called pt's Mom but did not get answer.  ? ?If insulin pump is unable to be restarted tonight, will need basal-bolus insulin. ? ?Semglee 10 units QHS ?Novolog 0-15 TID with meals and 0-5 HS ?Novolog 4 units TID with meals if eating > 50% ? ?If insulin pump is restarted, please d/c all SQ insulin orders.  ? ?Spoke with RN and secure text to MD. ? ?F/U in am. ? ?Thank you. ?Ailene Ards, RD, LDN, CDE ?Inpatient Diabetes Coordinator ?[phone]  ? ? ? ? ?

## 2021-04-22 NOTE — Progress Notes (Signed)
Initial Nutrition Assessment ? ?DOCUMENTATION CODES:  ? ?Not applicable ? ?INTERVENTION:  ?- will order Ensure Max once/day, each supplement provides 150 kcal and 30 grams of protein. ?- will order Glucerna Shake once/day, each supplement provides 220 kcal and 10 grams of protein ?- will order 30 ml Prosource Plus once/day, each supplement provides 100 kcal and 15 grams protein.  ?- will order 1 tablet multivitamin with minerals/day. ?- check anemia panel d/t hx of DM and woman who is child-bearing age.  ? ? ?NUTRITION DIAGNOSIS:  ? ?Inadequate oral intake related to acute illness, chronic illness (hx of gastroparesis) as evidenced by per patient/family report. ? ?GOAL:  ? ?Patient will meet greater than or equal to 90% of their needs ? ?MONITOR:  ? ?PO intake, Supplement acceptance, Diet advancement, Labs, Weight trends ? ?REASON FOR ASSESSMENT:  ? ?Malnutrition Screening Tool ? ?ASSESSMENT:  ? ?31 year old female with medical history of type 1 DM requiring an insulin pump, HTN, HLD, GERD, gastroparesis, hyperthyroidism, and hx of acute h.pylori gastric ulcer. She presented to the ED with N/V and was admitted for intractable nausea and vomiting and started on IV fluids and antiemetics. ? ?Patient laying in bed with Lab Tech attempting to do lab draw. Patient is very soft spoken and shares that she is feeling unwell.  ? ?She had beef broth earlier today and shares that about an hour and a half later she vomited. No other intakes today that she was able to recall during the course of discussion. Lunch tray on bedside table but appears mainly untouched. ? ?Patient has an insulin pump and CBGs are typically in the mid-100s. She usually sleeps until late morning and then has breakfast which is typically fruit or oatmeal or something similar. She eats again around 1500-1600 and it may be something such as chicken. She eats dinner around 1900 and dinner meal is variable. No reported snacks between meals. Did not have an  opportunity during this visit to ask about fluid intake. ? ?She has medication at home related to gastroparesis. She takes this TID and has found it to be beneficial. She was unable to recall the name of it during the course of discussion. ? ?Patient ready to rest once attempted lab draw had finished. Let patient know that RD will check back in when she is feeling better. ? ?Will trial ONS listed above and make adjustments at follow-up. ? ?Weight on 3/4 was 194 lb and weight on 02/24/21 was 179 lb. This indicates 15 lb weight gain in the past 2 months.  ? ? ? ?Labs reviewed; CBGs: 110, 118, 222 mg/dl.creatinine: 1.28 mg/dl, Ca: 8.7 mg/dl, GFR: 58 ml/min. ? ?Medications reviewed; sliding scale novolog, insulin pump every 4 hours, 400 mg mag-ox x1 dose 3/5, 10 mg IV reglan QID, 40 mg IV protonix BID. ? ?IVF; LR @ 100 ml/hr. ?  ? ?NUTRITION - FOCUSED PHYSICAL EXAM: ? ?Flowsheet Row Most Recent Value  ?Orbital Region No depletion  ?Upper Arm Region No depletion  ?Thoracic and Lumbar Region Unable to assess  ?Buccal Region No depletion  ?Temple Region No depletion  ?Clavicle Bone Region No depletion  ?Clavicle and Acromion Bone Region No depletion  ?Scapular Bone Region No depletion  ?Dorsal Hand No depletion  ?Patellar Region No depletion  ?Anterior Thigh Region No depletion  ?Posterior Calf Region No depletion  ?Edema (RD Assessment) None  ?Hair Reviewed  ?Eyes Reviewed  ?Mouth Reviewed  ?Skin Reviewed  ?Nails Reviewed  ? ?  ? ? ?  Diet Order:   ?Diet Order   ? ?       ?  Diet full liquid Room service appropriate? Yes; Fluid consistency: Thin  Diet effective now       ?  ? ?  ?  ? ?  ? ? ?EDUCATION NEEDS:  ? ?Not appropriate for education at this time ? ?Skin:  Skin Assessment: Skin Integrity Issues: ?Skin Integrity Issues:: Diabetic Ulcer ?Diabetic Ulcer: L foot ? ?Last BM:  PTA/unknown ? ?Height:  ? ?Ht Readings from Last 1 Encounters:  ?04/20/21 5\' 8"  (1.727 m)  ? ? ?Weight:  ? ?Wt Readings from Last 1 Encounters:   ?04/20/21 88.2 kg  ? ? ? ?BMI:  Body mass index is 29.57 kg/m?. ? ?Estimated Nutritional Needs:  ?Kcal:  2030-2250 kcal ?Protein:  100-115 grams ?Fluid:  >/= 2.2 L/day ? ? ? ?07-02-2001, MS, RD, LDN ?Inpatient Clinical Dietitian ?RD pager # available in AMION  ?After hours/weekend pager # available in AMION ? ?

## 2021-04-23 DIAGNOSIS — E1159 Type 2 diabetes mellitus with other circulatory complications: Secondary | ICD-10-CM

## 2021-04-23 DIAGNOSIS — E785 Hyperlipidemia, unspecified: Secondary | ICD-10-CM

## 2021-04-23 DIAGNOSIS — L97522 Non-pressure chronic ulcer of other part of left foot with fat layer exposed: Secondary | ICD-10-CM

## 2021-04-23 DIAGNOSIS — I152 Hypertension secondary to endocrine disorders: Secondary | ICD-10-CM

## 2021-04-23 LAB — COMPREHENSIVE METABOLIC PANEL
ALT: 15 U/L (ref 0–44)
AST: 19 U/L (ref 15–41)
Albumin: 2.6 g/dL — ABNORMAL LOW (ref 3.5–5.0)
Alkaline Phosphatase: 66 U/L (ref 38–126)
Anion gap: 6 (ref 5–15)
BUN: 26 mg/dL — ABNORMAL HIGH (ref 6–20)
CO2: 29 mmol/L (ref 22–32)
Calcium: 8.3 mg/dL — ABNORMAL LOW (ref 8.9–10.3)
Chloride: 101 mmol/L (ref 98–111)
Creatinine, Ser: 1.36 mg/dL — ABNORMAL HIGH (ref 0.44–1.00)
GFR, Estimated: 54 mL/min — ABNORMAL LOW (ref >60)
Glucose, Bld: 138 mg/dL — ABNORMAL HIGH (ref 70–99)
Potassium: 3.7 mmol/L (ref 3.5–5.1)
Sodium: 136 mmol/L (ref 135–145)
Total Bilirubin: 0.4 mg/dL (ref 0.3–1.2)
Total Protein: 5.7 g/dL — ABNORMAL LOW (ref 6.5–8.1)

## 2021-04-23 LAB — RETICULOCYTES
Immature Retic Fract: 18.4 % — ABNORMAL HIGH (ref 2.3–15.9)
RBC.: 3.24 MIL/uL — ABNORMAL LOW (ref 3.87–5.11)
Retic Count, Absolute: 59.6 10*3/uL (ref 19.0–186.0)
Retic Ct Pct: 1.8 % (ref 0.4–3.1)

## 2021-04-23 LAB — CBC
HCT: 28.8 % — ABNORMAL LOW (ref 36.0–46.0)
Hemoglobin: 9.3 g/dL — ABNORMAL LOW (ref 12.0–15.0)
MCH: 28.9 pg (ref 26.0–34.0)
MCHC: 32.3 g/dL (ref 30.0–36.0)
MCV: 89.4 fL (ref 80.0–100.0)
Platelets: 348 10*3/uL (ref 150–400)
RBC: 3.22 MIL/uL — ABNORMAL LOW (ref 3.87–5.11)
RDW: 13.2 % (ref 11.5–15.5)
WBC: 11.9 10*3/uL — ABNORMAL HIGH (ref 4.0–10.5)
nRBC: 0 % (ref 0.0–0.2)

## 2021-04-23 LAB — FERRITIN: Ferritin: 20 ng/mL (ref 11–307)

## 2021-04-23 LAB — FOLATE: Folate: 23.1 ng/mL (ref >5.9)

## 2021-04-23 LAB — GLUCOSE, CAPILLARY
Glucose-Capillary: 130 mg/dL — ABNORMAL HIGH (ref 70–99)
Glucose-Capillary: 130 mg/dL — ABNORMAL HIGH (ref 70–99)
Glucose-Capillary: 138 mg/dL — ABNORMAL HIGH (ref 70–99)
Glucose-Capillary: 140 mg/dL — ABNORMAL HIGH (ref 70–99)
Glucose-Capillary: 160 mg/dL — ABNORMAL HIGH (ref 70–99)
Glucose-Capillary: 166 mg/dL — ABNORMAL HIGH (ref 70–99)
Glucose-Capillary: 188 mg/dL — ABNORMAL HIGH (ref 70–99)

## 2021-04-23 LAB — IRON AND TIBC
Iron: 54 ug/dL (ref 28–170)
Saturation Ratios: 16 % (ref 10.4–31.8)
TIBC: 330 ug/dL (ref 250–450)
UIBC: 276 ug/dL

## 2021-04-23 LAB — MAGNESIUM: Magnesium: 1.9 mg/dL (ref 1.7–2.4)

## 2021-04-23 LAB — VITAMIN B12: Vitamin B-12: 774 pg/mL (ref 180–914)

## 2021-04-23 MED ORDER — LISINOPRIL 10 MG PO TABS
10.0000 mg | ORAL_TABLET | Freq: Every day | ORAL | Status: DC
  Administered 2021-04-23 – 2021-04-24 (×2): 10 mg via ORAL
  Filled 2021-04-23 (×2): qty 1

## 2021-04-23 MED ORDER — DICYCLOMINE HCL 20 MG PO TABS
20.0000 mg | ORAL_TABLET | Freq: Three times a day (TID) | ORAL | Status: DC
  Administered 2021-04-23 – 2021-04-24 (×3): 20 mg via ORAL
  Filled 2021-04-23 (×5): qty 1

## 2021-04-23 NOTE — Progress Notes (Signed)
PROGRESS NOTE    Kelli Hudson  JEH:[id number] DOB: 1990-05-08 DOA: 04/20/2021 PCP: Loyola Mast, MD   Brief Narrative:  31 year old female with a history of type 1 diabetes requiring an insulin pump, hypertension, hyperlipidemia, GERD and gastroparesis presented with nausea and vomiting.  She was admitted for intractable nausea and vomiting and started on IV fluids and antiemetics.  GI was subsequently consulted.  Assessment & Plan:   Intractable nausea and vomiting with abdominal pain, possibly from diabetic gastroparesis -CT of abdomen and pelvis was negative for any acute findings.   -Currently on full liquid diet.  Patient would like to try soft diet today.  Will advance diet to soft diet today. -Continue around-the-clock Reglan along with as needed Compazine.  Decrease IV fluids to 75 cc an hour. -Continue Bentyl and Protonix.  Narcotics would aggravate diabetic gastroparesis: Patient was in severe pain on 04/22/2021 and was stating that Toradol or morphine were not working and kept asking for Dilaudid.  After discussion the risks with the patient and her mother, she has been put on IV Dilaudid as needed. -GI was also consulted on 04/22/2021.  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  Diabetes coordinator following.  Hypertension -Blood pressure intermittently elevated.  Resume lisinopril.  Hyperlipidemia -Statin on hold.  LDL 140  Hypomagnesemia -Improved  Left foot ulcer: Present on admission -Possibly from trauma from shoe.  Follow wound care consult recommendations -Patient was recently put on Augmentin as an outpatient by podiatry: Currently on IV Unasyn; will switch to Augmentin once oral intake improves.   DVT prophylaxis: Lovenox Code Status: Full Family Communication: Mother on phone on 04/22/21 Disposition Plan: Status is: Inpatient Remains inpatient appropriate because: Of severity of illness.    Consultants:  GI  Procedures: None  Antimicrobials:  Anti-infectives (From admission, onward)    Start     Dose/Rate Route Frequency Ordered Stop   04/20/21 2230  Ampicillin-Sulbactam (UNASYN) 3 g in sodium chloride 0.9 % 100 mL IVPB        3 g 200 mL/hr over 30 Minutes Intravenous Every 6 hours 04/20/21 2217          Subjective: Patient seen and examined at bedside.  Pain is better controlled with IV Dilaudid.  Wants to try soft diet.  Denies any current chest pain, shortness of breath.  Still intermittently nauseous.   Objective: Vitals:   04/22/21 1647 04/22/21 1951 04/22/21 2340 04/23/21 0525  BP: (!) 184/102 (!) 186/127 137/88 (!) 141/107  Pulse: (!) 122 (!) 125 (!) 103 (!) 106  Resp: 20 18  18   Temp: 98.3 F (36.8 C) 98.7 F (37.1 C)  98.7 F (37.1 C)  TempSrc: Oral Oral  Oral  SpO2: 97% 92%  93%  Weight:      Height:        Intake/Output Summary (Last 24 hours) at 04/23/2021 0942 Last data filed at 04/23/2021 0600 Gross per 24 hour  Intake 1191.94 ml  Output --  Net 1191.94 ml    Filed Weights   04/20/21 2322  Weight: 88.2 kg    Examination:  General exam: No distress.  On room air.   Respiratory system: Decreased breath sounds at bases bilaterally, no wheezing Cardiovascular system: Tachycardic; S1-S2 heard  gastrointestinal system: Abdomen is distended slightly; soft and still slightly tender in the periumbilical/epigastric regions.  Bowel sounds are heard Extremities: No edema or clubbing Central nervous system: Awake and alert.  No focal neurological deficits.  Moves extremities  Skin: No obvious ecchymosis/lesions  psychiatry: Not agitated currently.  Flat affect.  Data Reviewed: I have personally reviewed following labs and imaging studies  CBC: Recent Labs  Lab 04/17/21 0757 04/18/21 1643 04/20/21 1411 04/21/21 0321 04/22/21 0325 04/23/21 0343  WBC 7.2 8.8 9.6 12.8* 12.7* 11.9*  NEUTROABS 2.7 4,048  --   --   --   --   HGB 10.4* 10.8* 11.9* 10.2*  10.3* 9.3*  HCT 31.4* 32.7* 36.5 31.9* 30.8* 28.8*  MCV 87.5 88.4 88.4 90.6 89.8 89.4  PLT 388.0 413* 461* 373 372 348    Basic Metabolic Panel: Recent Labs  Lab 04/18/21 1643 04/20/21 1411 04/21/21 0321 04/22/21 0325 04/23/21 0343  NA 137 137 136 140 136  K 4.5 3.6 3.9 3.5 3.7  CL 102 105 105 105 101  CO2 25 24 23 26 29   GLUCOSE 88 149* 120* 107* 138*  BUN 32* 19 18 21* 26*  CREATININE 1.16* 1.02* 0.98 1.28* 1.36*  CALCIUM 9.8 9.4 8.3* 8.7* 8.3*  MG  --   --  1.6* 1.7 1.9    GFR: Estimated Creatinine Clearance: 70.9 mL/min (A) (by C-G formula based on SCr of 1.36 mg/dL (H)). Liver Function Tests: Recent Labs  Lab 04/20/21 1411 04/21/21 0321 04/22/21 0325 04/23/21 0343  AST 24 19 18 19   ALT 23 16 16 15   ALKPHOS 94 66 69 66  BILITOT 0.7 0.5 0.5 0.4  PROT 8.0 6.1* 5.9* 5.7*  ALBUMIN 3.6 2.7* 2.7* 2.6*    Recent Labs  Lab 04/20/21 1411  LIPASE 27    No results for input(s): AMMONIA in the last 168 hours. Coagulation Profile: No results for input(s): INR, PROTIME in the last 168 hours. Cardiac Enzymes: No results for input(s): CKTOTAL, CKMB, CKMBINDEX, TROPONINI in the last 168 hours. BNP (last 3 results) No results for input(s): PROBNP in the last 8760 hours. HbA1C: No results for input(s): HGBA1C in the last 72 hours. CBG: Recent Labs  Lab 04/22/21 2156 04/23/21 0014 04/23/21 0402 04/23/21 0522 04/23/21 0746  GLUCAP 191* 188* 138* 130* 130*    Lipid Profile: No results for input(s): CHOL, HDL, LDLCALC, TRIG, CHOLHDL, LDLDIRECT in the last 72 hours. Thyroid Function Tests: No results for input(s): TSH, T4TOTAL, FREET4, T3FREE, THYROIDAB in the last 72 hours. Anemia Panel: Recent Labs    04/23/21 0343  VITAMINB12 774  FOLATE 23.1  FERRITIN 20  TIBC 330  IRON 54  RETICCTPCT 1.8   Sepsis Labs: No results for input(s): PROCALCITON, LATICACIDVEN in the last 168 hours.  Recent Results (from the past 240 hour(s))  Resp Panel by RT-PCR (Flu  A&B, Covid) Nasopharyngeal Swab     Status: None   Collection Time: 04/20/21 10:05 PM   Specimen: Nasopharyngeal Swab; Nasopharyngeal(NP) swabs in vial transport medium  Result Value Ref Range Status   SARS Coronavirus 2 by RT PCR NEGATIVE NEGATIVE Final    Comment: (NOTE) SARS-CoV-2 target nucleic acids are NOT DETECTED.  The SARS-CoV-2 RNA is generally detectable in upper respiratory specimens during the acute phase of infection. The lowest concentration of SARS-CoV-2 viral copies this assay can detect is 138 copies/mL. A negative result does not preclude SARS-Cov-2 infection and should not be used as the sole basis for treatment or other patient management decisions. A negative result may occur with  improper specimen collection/handling, submission of specimen other than nasopharyngeal swab, presence of viral mutation(s) within the areas targeted by this assay, and inadequate number of viral copies(<138 copies/mL). A negative  result must be combined with clinical observations, patient history, and epidemiological information. The expected result is Negative.  Fact Sheet for Patients:  BloggerCourse.com  Fact Sheet for Healthcare Providers:  SeriousBroker.it  This test is no t yet approved or cleared by the [place] FDA and  has been authorized for detection and/or diagnosis of SARS-CoV-2 by FDA under an Emergency Use Authorization (EUA). This EUA will remain  in effect (meaning this test can be used) for the duration of the COVID-19 declaration under Section 564(b)(1) of the Act, 21 U.S.C.section 360bbb-3(b)(1), unless the authorization is terminated  or revoked sooner.       Influenza A by PCR NEGATIVE NEGATIVE Final   Influenza B by PCR NEGATIVE NEGATIVE Final    Comment: (NOTE) The Xpert Xpress SARS-CoV-2/FLU/RSV plus assay is intended as an aid in the diagnosis of influenza from Nasopharyngeal swab specimens  and should not be used as a sole basis for treatment. Nasal washings and aspirates are unacceptable for Xpert Xpress SARS-CoV-2/FLU/RSV testing.  Fact Sheet for Patients: BloggerCourse.com  Fact Sheet for Healthcare Providers: SeriousBroker.it  This test is not yet approved or cleared by the [place] FDA and has been authorized for detection and/or diagnosis of SARS-CoV-2 by FDA under an Emergency Use Authorization (EUA). This EUA will remain in effect (meaning this test can be used) for the duration of the COVID-19 declaration under Section 564(b)(1) of the Act, 21 U.S.C. section 360bbb-3(b)(1), unless the authorization is terminated or revoked.  Performed at [facility], [address] [place], [place] [address]           Radiology Studies: CT ABDOMEN PELVIS W CONTRAST  Result Date: 04/21/2021 CLINICAL DATA:  Abdominal pain.  Nausea vomiting. EXAM: CT ABDOMEN AND PELVIS WITH CONTRAST TECHNIQUE: Multidetector CT imaging of the abdomen and pelvis was performed using the standard protocol following bolus administration of intravenous contrast. RADIATION DOSE REDUCTION: This exam was performed according to the departmental dose-optimization program which includes automated exposure control, adjustment of the mA and/or kV according to patient size and/or use of iterative reconstruction technique. CONTRAST:  OMNIPAQUE IOHEXOL 300 MG/ML  SOLN COMPARISON:  11/15/2020. FINDINGS: Lower chest: Clear lung bases. Hepatobiliary: No focal liver abnormality is seen. No gallstones, gallbladder wall thickening, or biliary dilatation. Pancreas: Unremarkable. No pancreatic ductal dilatation or surrounding inflammatory changes. Spleen: Normal in size without focal abnormality. Adrenals/Urinary Tract: Adrenal glands are unremarkable. Kidneys are normal, without renal calculi, focal lesion, or hydronephrosis. Bladder is  unremarkable. Stomach/Bowel: The appendix extends posteriorly and medially from the cecal tip, into the right pelvis. Along its mid to distal portion is dilated to a maximum of 1.3 cm. However, it contains air and there is no adjacent inflammation. It is similar in appearance to the prior CT. This is suspected to be a normal variant in this patient. Normal stomach. Small bowel and colon are normal in caliber. No bowel wall thickening or inflammation. Vascular/Lymphatic: No significant vascular findings are present. No enlarged abdominal or pelvic lymph nodes. Reproductive: Uterus and bilateral adnexa are unremarkable. Other: No abdominal wall hernia or abnormality. No abdominopelvic ascites. Musculoskeletal: No acute or significant osseous findings. IMPRESSION: 1. No acute findings. No findings to account for the patient's symptoms. 2. Distended appendix as detailed above, but without associated inflammation, and similar to the prior CT. This is felt be a normal variant in this patient. Electronically Signed   By: Amie Portland M.D.   On: 04/21/2021 14:47   DG CHEST  PORT 1 VIEW  Result Date: 04/22/2021 CLINICAL DATA:  Shortness of breath EXAM: PORTABLE CHEST 1 VIEW COMPARISON:  11/15/2020 FINDINGS: The heart size and mediastinal contours are within normal limits. Both lungs are clear. The visualized skeletal structures are unremarkable. IMPRESSION: No active disease. Electronically Signed   By: Ernie AvenaPalani  Rathinasamy M.D.   On: 04/22/2021 13:52        Scheduled Meds:  (feeding supplement) PROSource Plus  30 mL Oral Daily   dicyclomine  20 mg Intramuscular TID   enoxaparin (LOVENOX) injection  40 mg Subcutaneous Q24H   feeding supplement (GLUCERNA SHAKE)  237 mL Oral Q24H   gabapentin  300 mg Oral TID   insulin aspart  0-15 Units Subcutaneous TID WC   insulin aspart  0-5 Units Subcutaneous QHS   insulin pump   Subcutaneous Q4H   magnesium oxide  400 mg Oral Once   metoCLOPramide (REGLAN) injection  10  mg Intravenous Q6H   multivitamin with minerals  1 tablet Oral Daily   pantoprazole (PROTONIX) IV  40 mg Intravenous Q12H   Ensure Max Protein  11 oz Oral Daily   sodium chloride flush  3 mL Intravenous Q12H   Continuous Infusions:  ampicillin-sulbactam (UNASYN) IV 3 g (04/23/21 0407)   lactated ringers 100 mL/hr at 04/22/21 2330          Glade LloydKshitiz Lacoya Wilbanks, MD [facility] 04/23/2021, 9:42 AM

## 2021-04-23 NOTE — TOC CM/SW Note (Signed)
?  Transition of Care (TOC) Screening Note ? ? ?Patient Details  ?Name: Kelli Hudson ?Date of Birth: 1990-05-21 ? ? ?Transition of Care (TOC) CM/SW Contact:    ?Darleene Cleaver, LCSW ?Phone Number: ?04/23/2021, 3:43 PM ? ? ? ?Transition of Care Department [facility]) has reviewed patient and no TOC needs have been identified at this time. We will continue to monitor patient advancement through interdisciplinary progression rounds. If new patient transition needs arise, please place a TOC consult. ?  ?

## 2021-04-23 NOTE — Progress Notes (Addendum)
? ? ? ?Progress Note ?Hospital Day: 4 ? ?Chief Complaint:    nausea, vomiting ?   ? ?ASSESSMENT AND PLAN  ? ?Brief History:  ?31 yo female with PMH of Type I DM, HTN, HLD, GERD, H.pylori infection, erosive gastritis, gastroparesis, cholelithiasis. Admitted with nausea / vomiting/ scant blood in emesis.  ? ?# Gastroparesis ?--Continue Reglan  10 mg IV Q 6. GFP 54. If GFR declines further then should reduce dose to 5 mg ?--Getting IV Dilaudid which is probably impeding progress though she says that she feels better today and hasn't had any nausea. She is awaiting clear liquid tray ? ?# Type I DM with documented gastroparesis.  ? ?# Chronic [place] anemia. Hgb down slightly from baseline but probably dilutional.  ?   ? ?Subjective:  ?No abdominal pain or N/V today. She is going to try liquids for lunch ? ?GI Evaluations:   ? ?Endoscopic Studies  ? ?July 2017 EGD ?-- LA Grade B esophagitis ?--Gastritis ?Stomach, biopsy ?- CHRONIC INACTIVE GASTRITIS. ?- THERE IS NO EVIDENCE OF HELICOBACTER PYLORI, DYSPLASIA OR MALIGNANCY. ?- SEE COMMENT ? ?Dec 2021 EGD ?Z-line regular, 37 cm from the incisors. ?- Gastritis. Biopsied. ?- Normal duodenal bulb, first portion of the duodenum and second portion of the ?Duodenum ?A. GASTRIC BIOPSY:  ?- Reactive gastropathy with chronic inflammation.  ?- Immunohistochemistry is negative for Helicobacter pylori.  ?- No intestinal  ? ?Imaging:  ? ?05/16/2020 gastric emptying study ?severely delayed gastric emptying, 6% at 4 hr ? ?Sept 2022 CTAP w/ contrast ?IMPRESSION:  ?1.  Gastroenteritis with mild pneumatosis of the cecum, likely secondary to underlying inflammation.  No convincing evidence for bowel ischemia.  ?2.  Negative for bowel obstruction.  ?3.  No evidence for acute appendicitis.  ?4.  IVC flattening suggesting intravascular volume depletion.  ?5.  Moderate to severe distention of the stomach. ? ? ?Sept 2022 RUQ [place]  ?IMPRESSION: ?Cholelithiasis without sonographic evidence of acute  cholecystitis. ?  ?  ?CT ABDOMEN PELVIS W CONTRAST ?Result Date: 04/21/2021 ?IMPRESSION: 1. No acute findings. No findings to account for the patient's symptoms. 2. Distended appendix as detailed above, but without associated inflammation, and similar to the prior CT. This is felt be a normal variant in this patient. Electronically Signed   By: Lajean Manes M.D.   On: 04/21/2021 14:47  ? ? ?Lab Results: ?Recent Labs  ?  04/21/21 ?0321 04/22/21 ?0325 04/23/21 ?[id number]  ?WBC 12.8* 12.7* 11.9*  ?HGB 10.2* 10.3* 9.3*  ?HCT 31.9* 30.8* 28.8*  ?PLT 373 372 348  ? ?BMET ?Recent Labs  ?  04/21/21 ?0321 04/22/21 ?0325 04/23/21 ?[id number]  ?NA 136 140 136  ?K 3.9 3.5 3.7  ?CL 105 105 101  ?CO2 23 26 29   ?GLUCOSE 120* 107* 138*  ?BUN 18 21* 26*  ?CREATININE 0.98 1.28* 1.36*  ?CALCIUM 8.3* 8.7* 8.3*  ? ?LFT ?Recent Labs  ?  04/23/21 ?0343  ?PROT 5.7*  ?ALBUMIN 2.6*  ?AST 19  ?ALT 15  ?ALKPHOS 66  ?BILITOT 0.4  ? ?PT/INR ?No results for input(s): LABPROT, INR in the last 72 hours. ? ?  ? ?Scheduled inpatient medications:  ? (feeding supplement) PROSource Plus  30 mL Oral Daily  ? dicyclomine  20 mg Intramuscular TID  ? enoxaparin (LOVENOX) injection  40 mg Subcutaneous Q24H  ? feeding supplement (GLUCERNA SHAKE)  237 mL Oral Q24H  ? gabapentin  300 mg Oral TID  ? insulin aspart  0-15 Units Subcutaneous TID WC  ?  insulin aspart  0-5 Units Subcutaneous QHS  ? insulin pump   Subcutaneous Q4H  ? lisinopril  10 mg Oral Daily  ? magnesium oxide  400 mg Oral Once  ? metoCLOPramide (REGLAN) injection  10 mg Intravenous Q6H  ? multivitamin with minerals  1 tablet Oral Daily  ? pantoprazole (PROTONIX) IV  40 mg Intravenous Q12H  ? Ensure Max Protein  11 oz Oral Daily  ? sodium chloride flush  3 mL Intravenous Q12H  ? ?Continuous inpatient infusions:  ? ampicillin-sulbactam (UNASYN) IV 3 g (04/23/21 1001)  ? lactated ringers 75 mL/hr at 04/23/21 1002  ? ?PRN inpatient medications: acetaminophen **OR** acetaminophen, albuterol, alum & mag  hydroxide-simeth, HYDROmorphone (DILAUDID) injection, labetalol, nitroGLYCERIN, prochlorperazine ? ?Vital signs in last 24 hours: ?Temp:  [98.3 ?F (36.8 ?C)-99 ?F (37.2 ?C)] 98.7 ?F (37.1 ?C) (03/07 0525) ?Pulse Rate:  [103-125] 106 (03/07 0525) ?Resp:  [18-24] 18 (03/07 0525) ?BP: (137-192)/(88-127) 141/107 (03/07 0525) ?SpO2:  [92 %-98 %] 93 % (03/07 0525) ?Last BM Date : 04/22/21 ? ?Intake/Output Summary (Last 24 hours) at 04/23/2021 1109 ?Last data filed at 04/23/2021 0600 ?Gross per 24 hour  ?Intake 1191.94 ml  ?Output --  ?Net 1191.94 ml  ? ? ? ?Physical Exam:  ?General: Alert female in NAD ?Heart:  Regular rate and rhythm. No lower extremity edema ?Pulmonary: Normal respiratory effort ?Abdomen: Soft, nondistended, nontender. Normal bowel sounds.  ?Neurologic: Alert and oriented ?Psych: Pleasant. Cooperative.  ? ? ?Intake/Output from previous day: ?03/06 0701 - 03/07 0700 ?In: 1191.9 [P.O.:200; I.V.:580.2; IV Piggyback:411.7] ?Out: -  ?Intake/Output this shift: ?No intake/output data recorded. ? ? ? ?Principal Problem: ?  Intractable nausea and vomiting ?Active Problems: ?  Hypertension associated with diabetes ([place]) ?  Hyperlipidemia ?  Diabetic gastroparesis associated with type 1 diabetes mellitus ([place]) ?  Type 1 diabetes mellitus with diabetic polyneuropathy ([facility]) ?  Ulcerated, foot, left, with fat layer exposed ([place]) ?  Hypomagnesemia ? ? ? ? LOS: 2 days  ? ?Tye Savoy ,NP 04/23/2021, 11:09 AM ? ? ?Attending Attestation: ? ?I saw and evaluated the patient. No family was present at the time of my evaluation.  I agree with the Advanced Practitioner's note, impression and recommendations.   ? ?Today is day 3 of her symptoms, which she reports typically last 4 days if treatment with antiemetics and Dilaudid. Hopefully, our nutrition team can work with her during this hospitalization to maximize dietary modifications given her confusion about the concurrent diabetic diet. Continue antiemetics and prokinetic  medications. Maximize diabetic control.  Avoid narcotics.  ? ? ? ?Thornton Park, MD, MPH ?[facility]   ? ? ? ?

## 2021-04-24 ENCOUNTER — Encounter: Payer: Self-pay | Admitting: Internal Medicine

## 2021-04-24 DIAGNOSIS — E042 Nontoxic multinodular goiter: Secondary | ICD-10-CM

## 2021-04-24 LAB — COMPREHENSIVE METABOLIC PANEL
ALT: 17 U/L (ref 0–44)
AST: 25 U/L (ref 15–41)
Albumin: 2.6 g/dL — ABNORMAL LOW (ref 3.5–5.0)
Alkaline Phosphatase: 67 U/L (ref 38–126)
Anion gap: 4 — ABNORMAL LOW (ref 5–15)
BUN: 18 mg/dL (ref 6–20)
CO2: 26 mmol/L (ref 22–32)
Calcium: 8.4 mg/dL — ABNORMAL LOW (ref 8.9–10.3)
Chloride: 104 mmol/L (ref 98–111)
Creatinine, Ser: 0.85 mg/dL (ref 0.44–1.00)
GFR, Estimated: >60 mL/min (ref >60)
Glucose, Bld: 144 mg/dL — ABNORMAL HIGH (ref 70–99)
Potassium: 3.8 mmol/L (ref 3.5–5.1)
Sodium: 134 mmol/L — ABNORMAL LOW (ref 135–145)
Total Bilirubin: 0.4 mg/dL (ref 0.3–1.2)
Total Protein: 5.6 g/dL — ABNORMAL LOW (ref 6.5–8.1)

## 2021-04-24 LAB — CBC
HCT: 31.3 % — ABNORMAL LOW (ref 36.0–46.0)
Hemoglobin: 10 g/dL — ABNORMAL LOW (ref 12.0–15.0)
MCH: 28.9 pg (ref 26.0–34.0)
MCHC: 31.9 g/dL (ref 30.0–36.0)
MCV: 90.5 fL (ref 80.0–100.0)
Platelets: 337 10*3/uL (ref 150–400)
RBC: 3.46 MIL/uL — ABNORMAL LOW (ref 3.87–5.11)
RDW: 13.3 % (ref 11.5–15.5)
WBC: 10.9 10*3/uL — ABNORMAL HIGH (ref 4.0–10.5)
nRBC: 0 % (ref 0.0–0.2)

## 2021-04-24 LAB — MAGNESIUM: Magnesium: 1.9 mg/dL (ref 1.7–2.4)

## 2021-04-24 LAB — GLUCOSE, CAPILLARY
Glucose-Capillary: 145 mg/dL — ABNORMAL HIGH (ref 70–99)
Glucose-Capillary: 150 mg/dL — ABNORMAL HIGH (ref 70–99)
Glucose-Capillary: 178 mg/dL — ABNORMAL HIGH (ref 70–99)

## 2021-04-24 MED ORDER — TRAMADOL HCL 50 MG PO TABS: 50.0000 mg | ORAL_TABLET | Freq: Four times a day (QID) | ORAL | 0 refills | Status: DC | PRN

## 2021-04-24 MED ORDER — DICYCLOMINE HCL 20 MG PO TABS: 20.0000 mg | ORAL_TABLET | Freq: Three times a day (TID) | ORAL | 0 refills | Status: DC | PRN

## 2021-04-24 MED ORDER — GABAPENTIN 300 MG PO CAPS
300.0000 mg | ORAL_CAPSULE | Freq: Three times a day (TID) | ORAL | 0 refills | Status: DC
Start: 2021-04-24 — End: 2021-07-09

## 2021-04-24 MED ORDER — PROMETHAZINE HCL 12.5 MG PO TABS
12.5000 mg | ORAL_TABLET | Freq: Four times a day (QID) | ORAL | 0 refills | Status: DC | PRN
Start: 2021-04-24 — End: 2021-07-01

## 2021-04-24 MED ORDER — METOCLOPRAMIDE HCL 10 MG PO TABS: 10.0000 mg | ORAL_TABLET | Freq: Three times a day (TID) | ORAL | 0 refills | Status: DC

## 2021-04-24 MED ORDER — ALUM & MAG HYDROXIDE-SIMETH 200-200-20 MG/5ML PO SUSP: 30.0000 mL | Freq: Four times a day (QID) | ORAL | 0 refills | Status: DC | PRN

## 2021-04-24 MED ORDER — INSULIN REGULAR HUMAN 100 UNIT/ML IJ SOLN: 0.0000 [IU] | INTRAMUSCULAR | Status: DC

## 2021-04-24 MED ORDER — PANTOPRAZOLE SODIUM 40 MG PO TBEC: 40.0000 mg | DELAYED_RELEASE_TABLET | Freq: Two times a day (BID) | ORAL | 0 refills | Status: DC

## 2021-04-24 NOTE — Discharge Summary (Signed)
Physician Discharge Summary   Patient: Kelli Hudson MRN: [id number] DOB: May 07, 1990  Admit date:     04/20/2021  Discharge date: 04/24/21  Discharge Physician: Aline August   PCP: [name], MD   Recommendations at discharge:   Follow-up with PCP within a week Outpatient follow-up with GI Follow-up in the ED if symptoms worsen or continue. Comply with medications and follow-up  Hospital Course: 31 year old female with a history of type 1 diabetes requiring an insulin pump, hypertension, hyperlipidemia, GERD and gastroparesis presented with nausea and vomiting.  She was admitted for intractable nausea and vomiting and started on IV fluids and antiemetics.  GI was subsequently consulted.  During the hospitalization, her symptoms have gradually improved.  She required IV Dilaudid for severe pain.  Diet has been gradually advanced and she is tolerating it.  GI has cleared the patient for discharge.  Discharge patient home today with close outpatient follow-up with PCP and GI.  Assessment and Plan:  Intractable nausea and vomiting with abdominal pain, possibly from diabetic gastroparesis -CT of abdomen and pelvis was negative for any acute findings.   -Initially treated with IV fluids, n.p.o. along with around-the-clock Reglan and as needed IV Compazine. -Continue Bentyl and Protonix.   -Narcotics would aggravate diabetic gastroparesis: Patient was in severe pain on 04/22/2021 and was stating that Toradol or morphine were not working and kept asking for Dilaudid.  After discussion the risks with the patient and her mother, she had been put on IV Dilaudid as needed. -GI was also consulted on 04/22/2021. -Subsequently, condition has improved.  Her diet has been advanced and she is currently tolerating soft diet.  GI has cleared the patient for discharge and recommend around-the-clock Reglan with outpatient follow-up with GI. -Discharge patient home today on scheduled Reglan, Protonix  along with as needed Bentyl and Phenergan.  Will discharge on oral tramadol.  Will not prescribe narcotics.  She will need to follow-up with her PCP if she needs narcotics.  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  Carb modified diet.  Outpatient follow-up.   Hypertension -Blood pressure intermittently elevated.  Continue lisinopril.   Hyperlipidemia -Resume statin.  LDL 140   Hypomagnesemia -Improved   Left foot ulcer: Present on admission -Possibly from trauma from shoe.  Follow wound care consult recommendations -Patient was recently put on Augmentin as an outpatient by podiatry: Currently on IV Unasyn; will switch to Augmentin on discharge.  Consultants: GI Procedures performed: None Disposition: Home Diet recommendation:  Cardiac and Carb modified diet DISCHARGE MEDICATION: Allergies as of 04/24/2021   No Known Allergies      Medication List     STOP taking these medications    calcium gluconate 500 MG tablet   famotidine 20 MG tablet Commonly known as: PEPCID   hyoscyamine 0.375 MG 12 hr tablet Commonly known as: LEVBID   Santyl 250 UNIT/GM ointment Generic drug: collagenase       TAKE these medications    albuterol 108 (90 Base) MCG/ACT inhaler Commonly known as: VENTOLIN HFA Inhale 2 puffs into the lungs every 6 (six) hours as needed for shortness of breath.   alum & mag hydroxide-simeth 200-200-20 MG/5ML suspension Commonly known as: MAALOX/MYLANTA Take 30 mLs by mouth every 6 (six) hours as needed for indigestion or heartburn.   amoxicillin-clavulanate 875-125 MG tablet Commonly known as: AUGMENTIN Take 1 tablet by mouth 2 (two) times daily. What changed: additional instructions   atorvastatin 20 MG tablet Commonly known as:  LIPITOR Take 1 tablet (20 mg total) by mouth daily.   Dexcom G6 Receiver Devi USE AS DIRECTED What changed:  how much to take how to take this when to take this   Dexcom G6 Sensor  Misc 1 each by Does not apply route daily.   Dexcom G6 Transmitter Misc 1 each by Does not apply route daily.   dicyclomine 20 MG tablet Commonly known as: BENTYL Take 1 tablet (20 mg total) by mouth 3 (three) times daily as needed for spasms.   gabapentin 300 MG capsule Commonly known as: NEURONTIN Take 1 capsule (300 mg total) by mouth 3 (three) times daily.   Insulin Pen Needle 31G X 5 MM Misc 1 Device by Does not apply route QID. For use with insulin pens   insulin regular 100 units/mL injection Commonly known as: NovoLIN R Inject 0-0.15 mLs (0-15 Units total) into the skin See admin instructions. Via pump - Patient not sure about the dosage.   lisinopril 10 MG tablet Commonly known as: ZESTRIL Take 1 tablet (10 mg total) by mouth daily.   metoCLOPramide 10 MG tablet Commonly known as: Reglan Take 1 tablet (10 mg total) by mouth 4 (four) times daily -  before meals and at bedtime. What changed: when to take this   pantoprazole 40 MG tablet Commonly known as: PROTONIX Take 1 tablet (40 mg total) by mouth 2 (two) times daily before a meal. What changed: when to take this   promethazine 12.5 MG tablet Commonly known as: PHENERGAN Take 1 tablet (12.5 mg total) by mouth every 6 (six) hours as needed for nausea or vomiting.   traMADol 50 MG tablet Commonly known as: Ultram Take 1 tablet (50 mg total) by mouth every 6 (six) hours as needed.   True Metrix Blood Glucose Test test strip Generic drug: glucose blood Use as instructed   True Metrix Meter w/Device Kit 1 Device by Does not apply route 4 (four) times daily.   TRUEplus Lancets 28G Misc assist with checking blood sugar TID and qhs What changed:  how much to take how to take this when to take this        Follow-up Information     Rudd, Lillette Boxer, MD. Schedule an appointment as soon as possible for a visit in 1 week(s).   Specialty: Family Medicine Contact information: [place] [address] [phone]         Yetta Flock, MD Follow up on 06/06/2021.   Specialty: Gastroenterology Why: at 9:50 am. Please arrive 10 minutes early Contact information: [address] [place] [address] [phone]               Subjective: Patient seen and examined at bedside.  She feels much better and is tolerating soft diet.  Feels like she is ready to go home today.  No fever, current vomiting or shortness of breath reported. Discharge Exam: Filed Weights   04/20/21 2322  Weight: 88.2 kg  General: On room air.  No distress.   respiratory: Decreased breath sounds at bases bilaterally CVS: Currently rate controlled; S1-S2 heard  abdominal: Soft, nontender, slightly distended, no organomegaly, normal bowel sounds are heard  extremities: No cyanosis, clubbing, edema   Condition at discharge: fair  The results of significant diagnostics from this hospitalization (including imaging, microbiology, ancillary and laboratory) are listed below for reference.   Imaging Studies: CT ABDOMEN PELVIS W CONTRAST  Result Date: 04/21/2021 CLINICAL DATA:  Abdominal pain.  Nausea vomiting. EXAM: CT ABDOMEN AND PELVIS WITH CONTRAST TECHNIQUE: Multidetector CT imaging of the abdomen and pelvis was performed using the standard protocol following bolus administration of intravenous contrast. RADIATION DOSE REDUCTION: This exam was performed according to the departmental dose-optimization program which includes automated exposure control, adjustment of the mA and/or kV according to patient size and/or use of iterative reconstruction technique. CONTRAST:  184m OMNIPAQUE IOHEXOL 300 MG/ML  SOLN COMPARISON:  11/15/2020. FINDINGS: Lower chest: Clear lung bases. Hepatobiliary: No focal liver abnormality is seen. No gallstones, gallbladder wall thickening, or biliary dilatation. Pancreas: Unremarkable. No pancreatic ductal dilatation or surrounding inflammatory  changes. Spleen: Normal in size without focal abnormality. Adrenals/Urinary Tract: Adrenal glands are unremarkable. Kidneys are normal, without renal calculi, focal lesion, or hydronephrosis. Bladder is unremarkable. Stomach/Bowel: The appendix extends posteriorly and medially from the cecal tip, into the right pelvis. Along its mid to distal portion is dilated to a maximum of 1.3 cm. However, it contains air and there is no adjacent inflammation. It is similar in appearance to the prior CT. This is suspected to be a normal variant in this patient. Normal stomach. Small bowel and colon are normal in caliber. No bowel wall thickening or inflammation. Vascular/Lymphatic: No significant vascular findings are present. No enlarged abdominal or pelvic lymph nodes. Reproductive: Uterus and bilateral adnexa are unremarkable. Other: No abdominal wall hernia or abnormality. No abdominopelvic ascites. Musculoskeletal: No acute or significant osseous findings. IMPRESSION: 1. No acute findings. No findings to account for the patient's symptoms. 2. Distended appendix as detailed above, but without associated inflammation, and similar to the prior CT. This is felt be a normal variant in this patient. Electronically Signed   By: DLajean ManesM.D.   On: 04/21/2021 14:47   DG CHEST PORT 1 VIEW  Result Date: 04/22/2021 CLINICAL DATA:  Shortness of breath EXAM: PORTABLE CHEST 1 VIEW COMPARISON:  11/15/2020 FINDINGS: The heart size and mediastinal contours are within normal limits. Both lungs are clear. The visualized skeletal structures are unremarkable. IMPRESSION: No active disease. Electronically Signed   By: PElmer PickerM.D.   On: 04/22/2021 13:52    Microbiology: Results for orders placed or performed during the hospital encounter of 04/20/21  Resp Panel by RT-PCR (Flu A&B, Covid) Nasopharyngeal Swab     Status: None   Collection Time: 04/20/21 10:05 PM   Specimen: Nasopharyngeal Swab; Nasopharyngeal(NP) swabs in  vial transport medium  Result Value Ref Range Status   SARS Coronavirus 2 by RT PCR NEGATIVE NEGATIVE Final    Comment: (NOTE) SARS-CoV-2 target nucleic acids are NOT DETECTED.  The SARS-CoV-2 RNA is generally detectable in upper respiratory specimens during the acute phase of infection. The lowest concentration of SARS-CoV-2 viral copies this assay can detect is 138 copies/mL. A negative result does not preclude SARS-Cov-2 infection and should not be used as the sole basis for treatment or other patient management decisions. A negative result may occur with  improper specimen collection/handling, submission of specimen other than nasopharyngeal swab, presence of viral mutation(s) within the areas targeted by this assay, and inadequate number of viral copies(<138 copies/mL). A negative result must be combined with clinical observations, patient history, and epidemiological information. The expected result is Negative.  Fact Sheet for Patients:  hEntrepreneurPulse.com.au Fact Sheet for Healthcare Providers:  hIncredibleEmployment.be This test is no t yet approved or cleared by the [place] and  has been authorized for detection and/or diagnosis of SARS-CoV-2 by FDA under  an Emergency Use Authorization (EUA). This EUA will remain  in effect (meaning this test can be used) for the duration of the COVID-19 declaration under Section 564(b)(1) of the Act, 21 U.S.C.section 360bbb-3(b)(1), unless the authorization is terminated  or revoked sooner.       Influenza A by PCR NEGATIVE NEGATIVE Final   Influenza B by PCR NEGATIVE NEGATIVE Final    Comment: (NOTE) The Xpert Xpress SARS-CoV-2/FLU/RSV plus assay is intended as an aid in the diagnosis of influenza from Nasopharyngeal swab specimens and should not be used as a sole basis for treatment. Nasal washings and aspirates are unacceptable for Xpert Xpress SARS-CoV-2/FLU/RSV testing.  Fact  Sheet for Patients: EntrepreneurPulse.com.au  Fact Sheet for Healthcare Providers: IncredibleEmployment.be  This test is not yet approved or cleared by the [place] FDA and has been authorized for detection and/or diagnosis of SARS-CoV-2 by FDA under an Emergency Use Authorization (EUA). This EUA will remain in effect (meaning this test can be used) for the duration of the COVID-19 declaration under Section 564(b)(1) of the Act, 21 U.S.C. section 360bbb-3(b)(1), unless the authorization is terminated or revoked.  Performed at [facility], [place] [address], [place], [place] [address]     Labs: CBC: Recent Labs  Lab 04/18/21 1643 04/20/21 1411 04/21/21 0321 04/22/21 0325 04/23/21 0343 04/24/21 0342  WBC 8.8 9.6 12.8* 12.7* 11.9* 10.9*  NEUTROABS 4,048  --   --   --   --   --   HGB 10.8* 11.9* 10.2* 10.3* 9.3* 10.0*  HCT 32.7* 36.5 31.9* 30.8* 28.8* 31.3*  MCV 88.4 88.4 90.6 89.8 89.4 90.5  PLT 413* 461* 373 372 348 517   Basic Metabolic Panel: Recent Labs  Lab 04/20/21 1411 04/21/21 0321 04/22/21 0325 04/23/21 0343 04/24/21 0342  NA 137 136 140 136 134*  K 3.6 3.9 3.5 3.7 3.8  CL 105 105 105 101 104  CO2 _0 GLUCOSE 149* 120* 107* 138* 144*  BUN 19 18 21* 26* 18  CREATININE 1.02* 0.98 1.28* 1.36* 0.85  CALCIUM 9.4 8.3* 8.7* 8.3* 8.4*  MG  --  1.6* 1.7 1.9 1.9   Liver Function Tests: Recent Labs  Lab 04/20/21 1411 04/21/21 0321 04/22/21 0325 04/23/21 0343 04/24/21 0342  AST _1 ALT _2 ALKPHOS 94 66 69 66 67  BILITOT 0.7 0.5 0.5 0.4 0.4  PROT 8.0 6.1* 5.9* 5.7* 5.6*  ALBUMIN 3.6 2.7* 2.7* 2.6* 2.6*   CBG: Recent Labs  Lab 04/23/21 1654 04/23/21 2002 04/24/21 0009 04/24/21 0437 04/24/21 0734  GLUCAP 160* 166* 178* 145* 150*    Discharge time spent: greater than 30 minutes.  Signed: Aline August, MD [facility] 04/24/2021

## 2021-04-24 NOTE — Progress Notes (Signed)
? ? ? ?Progress Note ? ?Hospital Day: 5 ? ?Chief Complaint: nausea and vomiting  ? ?Assessment and Plan  ? ?Brief History ?31 yo female with PMH of Type I DM, HTN, HLD, GERD, H.pylori infection, erosive gastritis, gastroparesis, cholelithiasis. Admitted with nausea / vomiting/ scant blood in emesis.  ?  ?# Gastroparesis, severely delayed gastric emptying on scan ( only 6% emptied at 4 hours) ?--Continue Reglan at home TID before meals and at bedtime. --Recommend frequent, low fiber meals at home ?--Made follow up appt with [name] on 4/20 at 9:50 am ?--Candidate for gastric pacer? ?  ?# Type I DM with documented gastroparesis.  ?  ?# Chronic [place] anemia. Hgb now 10 ( near baseline) ? ? ?Subjective  ? ?Feels much better today. Ate 1/2 cheeseburger and fries for dinner and bacon and grits for breakfast.  ? ? ?Objective  ? ? ?Lab Results: ?Recent Labs  ?  04/22/21 ?0325 04/23/21 ?0343 04/24/21 ?0342  ?WBC 12.7* 11.9* 10.9*  ?HGB 10.3* 9.3* 10.0*  ?HCT 30.8* 28.8* 31.3*  ?PLT 372 348 337  ? ?BMET ?Recent Labs  ?  04/22/21 ?0325 04/23/21 ?0343 04/24/21 ?0342  ?NA 140 136 134*  ?K 3.5 3.7 3.8  ?CL 105 101 104  ?CO2 26 29 26   ?GLUCOSE 107* 138* 144*  ?BUN 21* 26* 18  ?CREATININE 1.28* 1.36* 0.85  ?CALCIUM 8.7* 8.3* 8.4*  ? ?LFT ?Recent Labs  ?  04/24/21 ?0342  ?PROT 5.6*  ?ALBUMIN 2.6*  ?AST 25  ?ALT 17  ?ALKPHOS 67  ?BILITOT 0.4  ? ?PT/INR ?No results for input(s): LABPROT, INR in the last 72 hours. ? ?  ?Scheduled inpatient medications:  ? (feeding supplement) PROSource Plus  30 mL Oral Daily  ? dicyclomine  20 mg Oral TID  ? enoxaparin (LOVENOX) injection  40 mg Subcutaneous Q24H  ? feeding supplement (GLUCERNA SHAKE)  237 mL Oral Q24H  ? gabapentin  300 mg Oral TID  ? insulin aspart  0-15 Units Subcutaneous TID WC  ? insulin aspart  0-5 Units Subcutaneous QHS  ? insulin pump   Subcutaneous Q4H  ? lisinopril  10 mg Oral Daily  ? magnesium oxide  400 mg Oral Once  ? metoCLOPramide (REGLAN) injection  10 mg  Intravenous Q6H  ? multivitamin with minerals  1 tablet Oral Daily  ? pantoprazole (PROTONIX) IV  40 mg Intravenous Q12H  ? Ensure Max Protein  11 oz Oral Daily  ? sodium chloride flush  3 mL Intravenous Q12H  ? ?Continuous inpatient infusions:  ? ampicillin-sulbactam (UNASYN) IV 3 g (04/24/21 0445)  ? lactated ringers 75 mL/hr at 04/23/21 1958  ? ?PRN inpatient medications: acetaminophen **OR** acetaminophen, albuterol, alum & mag hydroxide-simeth, HYDROmorphone (DILAUDID) injection, labetalol, nitroGLYCERIN, prochlorperazine ? ?Vital signs in last 24 hours: ?Temp:  [98.5 ?F (36.9 ?C)-98.6 ?F (37 ?C)] 98.6 ?F (37 ?C) (03/08 0439) ?Pulse Rate:  [94-110] 94 (03/08 0439) ?Resp:  [16-20] 16 (03/08 0439) ?BP: (103-140)/(67-93) 140/93 (03/08 0439) ?SpO2:  [97 %-98 %] 97 % (03/08 0439) ?Last BM Date : 04/23/21 ? ?Intake/Output Summary (Last 24 hours) at 04/24/2021 0935 ?Last data filed at 04/24/2021 0600 ?Gross per 24 hour  ?Intake 2746.9 ml  ?Output --  ?Net 2746.9 ml  ? ? ? ?Physical Exam:  ?General: Alert female in NAD ?Heart:  Regular rate and rhythm. No lower extremity edema ?Pulmonary: Normal respiratory effort ?Abdomen: Soft, nondistended, nontender. Normal bowel sounds.  ?Neurologic: Alert and oriented ?Psych: Pleasant. Cooperative.  ? ? ?  Intake/Output from previous day: ?03/07 0701 - 03/08 0700 ?In: 2746.9 [P.O.:120; I.V.:1987; IV Piggyback:399.9] ?Out: -  ?Intake/Output this shift: ?No intake/output data recorded. ? ? ? ?Principal Problem: ?  Intractable nausea and vomiting ?Active Problems: ?  Hypertension associated with diabetes (HCC) ?  Hyperlipidemia ?  Diabetic gastroparesis associated with type 1 diabetes mellitus (HCC) ?  Type 1 diabetes mellitus with diabetic polyneuropathy (HCC) ?  Ulcerated, foot, left, with fat layer exposed (HCC) ?  Hypomagnesemia ? ? ? ? LOS: 3 days  ? ?Willette Cluster ,NP 04/24/2021, 9:35 AM ? ? ? ? ? ? ?

## 2021-04-25 ENCOUNTER — Telehealth: Payer: Self-pay

## 2021-04-25 NOTE — Telephone Encounter (Signed)
Vm left for patient to contact insurance to find out which location they prefer and give [place] a call back with information.  ?

## 2021-04-26 ENCOUNTER — Other Ambulatory Visit: Payer: Self-pay

## 2021-04-26 ENCOUNTER — Ambulatory Visit (INDEPENDENT_AMBULATORY_CARE_PROVIDER_SITE_OTHER): Payer: 59 | Admitting: Podiatry

## 2021-04-26 DIAGNOSIS — M7989 Other specified soft tissue disorders: Secondary | ICD-10-CM

## 2021-04-26 DIAGNOSIS — L97522 Non-pressure chronic ulcer of other part of left foot with fat layer exposed: Secondary | ICD-10-CM

## 2021-04-28 ENCOUNTER — Encounter: Payer: Self-pay | Admitting: Internal Medicine

## 2021-04-29 ENCOUNTER — Other Ambulatory Visit: Payer: Self-pay

## 2021-04-29 ENCOUNTER — Encounter (HOSPITAL_BASED_OUTPATIENT_CLINIC_OR_DEPARTMENT_OTHER): Payer: 59 | Attending: Internal Medicine | Admitting: General Surgery

## 2021-04-29 DIAGNOSIS — E10621 Type 1 diabetes mellitus with foot ulcer: Secondary | ICD-10-CM | POA: Diagnosis present

## 2021-04-29 DIAGNOSIS — L97528 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ty: Secondary | ICD-10-CM | POA: Diagnosis not present

## 2021-04-29 DIAGNOSIS — I10 Essential (primary) hypertension: Secondary | ICD-10-CM | POA: Diagnosis not present

## 2021-04-29 DIAGNOSIS — E1065 Type 1 diabetes mellitus with hyperglycemia: Secondary | ICD-10-CM | POA: Diagnosis not present

## 2021-04-29 DIAGNOSIS — E1042 Type 1 diabetes mellitus with diabetic polyneuropathy: Secondary | ICD-10-CM | POA: Insufficient documentation

## 2021-04-29 DIAGNOSIS — E0865 Diabetes mellitus due to underlying condition with hyperglycemia: Secondary | ICD-10-CM

## 2021-04-29 DIAGNOSIS — Z89422 Acquired absence of other left toe(s): Secondary | ICD-10-CM | POA: Diagnosis not present

## 2021-04-29 MED ORDER — INSULIN REGULAR HUMAN 100 UNIT/ML IJ SOLN: 0.0000 [IU] | INTRAMUSCULAR | 1 refills | Status: DC

## 2021-04-29 NOTE — Progress Notes (Signed)
FAHIMA, ALCOTT ([id number]) Visit Report for 04/29/2021 Chief Complaint Document Details Patient Name: Date of Service: Kelli Hudson [facility] 04/29/2021 2:45 PM Medical Record Number: [id number] Patient Account Number: [id number] Date of Birth/Sex: Treating RN: 10-30-1990 (31 y.o. [name] Primary Care Provider: Arlester Marker Other Clinician: Referring Provider: Treating Provider/Extender: Blanch Media in Treatment: 10 Information Obtained from: Patient Chief Complaint Left foot wounds Electronic Signature(s) Signed: 04/29/2021 3:28:33 PM By: Fredirick Maudlin MD FACS Entered By: Fredirick Maudlin on 04/29/2021 15:28:33 -------------------------------------------------------------------------------- Debridement Details Patient Name: Date of Service: Kelli Hudson [facility] 04/29/2021 2:45 PM Medical Record Number: [id number] Patient Account Number: [id number] Date of Birth/Sex: Treating RN: 1990/03/19 (31 y.o. [name], [name] Primary Care Provider: Arlester Marker Other Clinician: Referring Provider: Treating Provider/Extender: Blanch Media in Treatment: 10 Debridement Performed for Assessment: Wound #2 Left,Lateral Foot Performed By: Physician Fredirick Maudlin, MD Debridement Type: Debridement Severity of Tissue Pre Debridement: Fat layer exposed Level of Consciousness (Pre-procedure): Awake and Alert Pre-procedure Verification/Time Out Yes - 15:25 Taken: Start Time: 15:25 Pain Control: Lidocaine T Area Debrided (L x W): otal 1.4 (cm) x 0.8 (cm) = 1.12 (cm) Tissue and other material debrided: Non-Viable, Subcutaneous, Skin: Dermis , Skin: Epidermis Level: Skin/Subcutaneous Tissue Debridement Description: Excisional Instrument: Curette Bleeding: Minimum Hemostasis Achieved: Pressure End Time: 15:25 Procedural Pain: 0 Post Procedural Pain: 0 Response to Treatment: Procedure was tolerated well Level  of Consciousness (Post- Awake and Alert procedure): Post Debridement Measurements of Total Wound Length: (cm) 1.4 Width: (cm) 0.8 Depth: (cm) 0.1 Volume: (cm) 0.088 Character of Wound/Ulcer Post Debridement: Improved Severity of Tissue Post Debridement: Fat layer exposed Post Procedure Diagnosis Same as Pre-procedure Electronic Signature(s) Unsigned Entered By: Rhae Hammock on 04/29/2021 15:28:44 -------------------------------------------------------------------------------- HPI Details Patient Name: Date of Service: Kelli Hudson [facility] 04/29/2021 2:45 PM Medical Record Number: [id number] Patient Account Number: [id number] Date of Birth/Sex: Treating RN: [birth year] (32 y.o. [name] Primary Care Provider: Arlester Marker Other Clinician: Referring Provider: Treating Provider/Extender: Blanch Media in Treatment: 10 History of Present Illness HPI Description: Admission 02/14/2021 Ms. 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 While she was treating this wound she subsequently developed 3 additional wounds. These are located to the dorsal aspect and lateral aspect of the left foot and third toe. The wounds started out as blisters and she thinks these developed due to putting on the dressing too tightly around her foot. Currently she has been using Santyl to the wound beds however she has been leaving this to air dry throughout the day. She reports finishing doxycycline and currently denies signs of infection. 1/6; patient presents for follow-up. She has been using Santyl to the areas of eschar and Hydrofera Blue to the toe wound. She reports  improvement in wound healing. She has no issues or complaints today. She denies signs of infection. 1/23; patient presents for follow-up. She has been using Santyl to the wound beds without issues. She denies signs of infection. 1/30; patient presents for follow-up. She has been using Santyl to the lateral left foot wound and Santyl and Hydrofera Blue to the dorsal left foot wound. She denies signs  of infection. She has no issues or complaints today. 2/6; patient presents for follow-up. She has no issues or complaints today. She denies signs of infection. 2/27; patient presents for follow-up. She has no issues or complaints today. She continues to use Santyl and [employer]. She denies signs of infection. 2/13; patient presents for follow-up. She has no issues or complaints today. She denies signs of infection. 04/29/2021: Here for follow-up, no concerns or complaints. No concern for infection. Just using Hydrofera Blue at this time. Electronic Signature(s) Signed: 04/29/2021 3:29:11 PM By: Fredirick Maudlin MD FACS Entered By: Fredirick Maudlin on 04/29/2021 15:29:11 -------------------------------------------------------------------------------- Problem List Details Patient Name: Date of Service: Kelli Hudson [facility] 04/29/2021 2:45 PM Medical Record Number: [id number] Patient Account Number: [id number] Date of Birth/Sex: Treating RN: [birth year] (31 y.o. [name] Primary Care Provider: Arlester Marker Other Clinician: Referring Provider: Treating Provider/Extender: Blanch Media in Treatment: 10 Active Problems ICD-10 Encounter Code Description Active Date MDM Diagnosis L97.528 Non-pressure chronic ulcer of other part of left foot with other specified 02/14/2021 No Yes severity E10.621 Type 1 diabetes mellitus with foot ulcer 02/14/2021 No Yes E10.42 Type 1 diabetes mellitus with diabetic polyneuropathy 02/14/2021 No Yes Z89.422 Acquired absence of  other left toe(s) 02/14/2021 No Yes I10 Essential (primary) hypertension 02/14/2021 No Yes Inactive Problems Resolved Problems Electronic Signature(s) Signed: 04/29/2021 3:27:46 PM By: Fredirick Maudlin MD FACS Entered By: Fredirick Maudlin on 04/29/2021 15:27:46

## 2021-05-01 ENCOUNTER — Other Ambulatory Visit: Payer: Self-pay | Admitting: Physician Assistant

## 2021-05-01 NOTE — Progress Notes (Signed)
Subjective: ?31 year old female presents the office today for follow-up evaluation of wounds to her left foot.  Since last saw her she was admitted to the hospital for gastroparesis.  She states the wounds are doing better and the swelling is improved.  She denies any fevers or chills.  No nausea or vomiting. ? ?Objective: ?AAO x3, NAD ?DP/PT pulses palpable bilaterally, CRT less than 3 seconds ?Sensation decreased ?On the lateral aspect of the left foot on the fifth metatarsal base is a granular wound with hyperkeratotic periwound.  Wound does appear to be smaller today.  See picture below.  The wound is present to medial aspect of foot is healed.  There is decreased edema to the foot there is no erythema or warmth.  There is no areas of fluctuation or crepitation and there is no malodor.  ?No pain with calf compression, swelling, warmth, erythema ? ?(Pictures did not save) ? ?Assessment: ?Ulcerations left foot with swelling/concern for infection ? ?Plan: ?-All treatment options discussed with the patient including all alternatives, risks, complications.  ?-Wound on the medial aspect of the foot has healed.  I did sharply debride the wound today on the left side as she has no current appoint with wound care center with him to fully evaluate this as well.  However there is no probing, undermining or tunneling there is no fluctuance or crepitation.  The swelling has much improved.  I also reviewed the blood work with her. ?-X-rays reviewed.  No evidence of acute fracture or osteomyelitis today.  Sed rate has improved to 58, CRP is normal, white blood cell count 8.8. ?-He is to monitor the foot very closely for signs or symptoms of infection and call the office or report to emergency room should any occur.  I will see her back next couple weeks also follow-up with wound care center. ? ?Return in about 3 weeks (around 05/17/2021).  Repeat x-rays next appointment as well as sed rate ? ?Vivi Barrack DPM ?

## 2021-05-03 NOTE — Progress Notes (Signed)
Kelli, Hudson ([id number]) ?Visit Report for 04/29/2021 ?Arrival Information Details ?Patient Name: Date of Service: ?Kelli Hudson [facility] 04/29/2021 2:45 PM ?Medical Record Number: [id number] ?Patient Account Number: [id number] ?Date of Birth/Sex: Treating RN: ?22-Jun-1990 (31 y.o. Kelli Hudson ?Primary Care Kenneith Stief: Arlester Marker Other Clinician: ?Referring Kimberlyann Hollar: ?Treating Latonja Bobeck/Extender: Fredirick Maudlin ?Arlester Marker ?Weeks in Treatment: 10 ?Visit Information History Since Last Visit ?Added or deleted any medications: No ?Patient Arrived: Ambulatory ?Any new allergies or adverse reactions: No ?Arrival Time: 14:55 ?Had a fall or experienced change in No ?Accompanied By: boyfriend ?activities of daily living that may affect ?Transfer Assistance: None ?risk of falls: ?Patient Identification Verified: Yes ?Signs or symptoms of abuse/neglect since last visito No ?Secondary Verification Process Completed: Yes ?Hospitalized since last visit: No ?Patient Requires Transmission-Based Precautions: No ?Implantable device outside of the clinic excluding No ?Patient Has Alerts: No ?cellular tissue based products placed in the center ?since last visit: ?Has Dressing in Place as Prescribed: Yes ?Pain Present Now: No ?Electronic Signature(s) ?Signed: 04/30/2021 9:02:06 AM By: Sandre Kitty ?Entered By: Sandre Kitty on 04/29/2021 14:56:33 ?-------------------------------------------------------------------------------- ?Encounter Discharge Information Details ?Patient Name: Date of Service: ?Kelli Hudson [facility] 04/29/2021 2:45 PM ?Medical Record Number: [id number] ?Patient Account Number: [id number] ?Date of Birth/Sex: Treating RN: ?April 20, 1990 (31 y.o. Kelli Hudson, Kelli Hudson ?Primary Care Ligia Duguay: Arlester Marker Other Clinician: ?Referring Draylon Mercadel: ?Treating Emmanual Gauthreaux/Extender: Fredirick Maudlin ?Arlester Marker ?Weeks in Treatment: 10 ?Encounter Discharge Information Items Post Procedure  Vitals ?Discharge Condition: Stable ?Temperature (F): 98.7 ?Ambulatory Status: Ambulatory ?Pulse (bpm): 74 ?Discharge Destination: Home ?Respiratory Rate (breaths/min): 17 ?Transportation: Private Auto ?Blood Pressure (mmHg): 134/74 ?Accompanied By: boyfriend ?Schedule Follow-up Appointment: Yes ?Clinical Summary of Care: Patient Declined ?Electronic Signature(s) ?Signed: 05/03/2021 12:57:05 PM By: Rhae Hammock RN ?Entered By: Rhae Hammock on 04/29/2021 15:42:36 ?-------------------------------------------------------------------------------- ?Lower Extremity Assessment Details ?Patient Name: ?Date of Service: ?Kelli Hudson [facility] 04/29/2021 2:45 PM ?Medical Record Number: [id number] ?Patient Account Number: [id number] ?Date of Birth/Sex: ?Treating RN: ?09/16/1990 (31 y.o. Kelli Hudson, Kelli Hudson ?Primary Care Kelli Hudson: Arlester Marker ?Other Clinician: ?Referring Yadir Zentner: ?Treating Jakyren Fluegge/Extender: Fredirick Maudlin ?Arlester Marker ?Weeks in Treatment: 10 ?Edema Assessment ?Assessed: [Left: Yes] [Right: No] ?Edema: [Left: N] [Right: o] ?Calf ?Left: Right: ?Point of Measurement: 31 cm From Medial Instep 37 cm ?Ankle ?Left: Right: ?Point of Measurement: 10 cm From Medial Instep 23.8 cm ?Vascular Assessment ?Pulses: ?Dorsalis Pedis ?Palpable: [Left:Yes] ?Posterior Tibial ?Palpable: [Left:Yes] ?Electronic Signature(s) ?Signed: 05/03/2021 12:57:05 PM By: Rhae Hammock RN ?Entered By: Rhae Hammock on 04/29/2021 15:20:59 ?-------------------------------------------------------------------------------- ?Multi Wound Chart Details ?Patient Name: ?Date of Service: ?Kelli Hudson [facility] 04/29/2021 2:45 PM ?Medical Record Number: [id number] ?Patient Account Number: [id number] ?Date of Birth/Sex: ?Treating RN: ?April 22, 1990 (31 y.o. Kelli Hudson ?Primary Care Nakiyah Beverley: Arlester Marker ?Other Clinician: ?Referring Fallynn Gravett: ?Treating Aubrey Voong/Extender: Fredirick Maudlin ?Arlester Marker ?Weeks in Treatment:  10 ?Vital Signs ?Height(in): 68 ?Capillary Blood Glucose(mg/dl): 165 ?Weight(lbs): 183 ?Pulse(bpm): 132 ?Body Mass Index(BMI): 27.8 ?Blood Pressure(mmHg): 115/75 ?Temperature(??F): 98.8 ?Respiratory Rate(breaths/min): 18 ?Photos: [1:Left, Dorsal Foot] [2:Left, Lateral Foot] [N/A:N/A N/A] ?Wound Location: [1:Gradually Appeared] [2:Blister] [N/A:N/A] ?Wounding Event: [1:Diabetic Wound/Ulcer of the Lower] [2:Diabetic Wound/Ulcer of the Lower] [N/A:N/A] ?Primary Etiology: [1:Extremity Anemia, Type I Diabetes, Neuropathy Anemia, Type I Diabetes, Neuropathy N/A] [2:Extremity] ?Comorbid History: [1:01/04/2021] [2:01/04/2021] [N/A:N/A] ?Date Acquired: [1:10] [2:10] [N/A:N/A] ?Weeks of Treatment: [1:Open] [2:Open] [N/A:N/A] ?Wound Status: [1:No] [2:No] [N/A:N/A] ?Wound Recurrence: [1:0.3x0.4x0.1] [2:1.4x0.8x0.1] [N/A:N/A] ?Measurements L x W x D (cm) [1:0.094] [2:0.88] [N/A:N/A] ?A (cm?) : ?  rea [1:0.009] [2:0.088] [N/A:N/A] ?Volume (cm?) : [1:99.60%] [2:91.30%] [N/A:N/A] ?% Reduction in A rea: [1:99.70%] [2:91.30%] [N/A:N/A] ?% Reduction in Volume: [1:Grade 1] [2:Grade 1] [N/A:N/A] ?Classification: [1:Medium] [2:Medium] [N/A:N/A] ?Exudate A mount: [1:Serosanguineous] [2:Serosanguineous] [N/A:N/A] ?Exudate Type: [1:red, brown] [2:red, brown] [N/A:N/A] ?Exudate Color: [1:Distinct, outline attached] [2:Distinct, outline attached] [N/A:N/A] ?Wound Margin: [1:Medium (34-66%)] [2:Large (67-100%)] [N/A:N/A] ?Granulation A mount: [1:Red] [2:Red] [N/A:N/A] ?Granulation Quality: [1:Medium (34-66%)] [2:Small (1-33%)] [N/A:N/A] ?Necrotic A mount: ?[1:Fat Layer (Subcutaneous Tissue): Yes Fat Layer (Subcutaneous Tissue): Yes N/A] ?Exposed Structures: ?[1:Fascia: No Tendon: No Muscle: No Joint: No Bone: No Medium (34-66%)] [2:Fascia: No Tendon: No Muscle: No Joint: No Bone: No Medium (34-66%)] [N/A:N/A] ?Treatment Notes ?Electronic Signature(s) ?Signed: 04/29/2021 3:28:17 PM By: Fredirick Maudlin MD FACS ?Signed: 04/29/2021 5:18:59 PM By:  Lorrin Jackson ?Entered By: Fredirick Maudlin on 04/29/2021 15:28:17 ?-------------------------------------------------------------------------------- ?Multi-Disciplinary Care Plan Details ?Patient Name: ?Date of Service: ?Kelli Hudson [facility] 04/29/2021 2:45 PM ?Medical Record Number: [id number] ?Patient Account Number: [id number] ?Date of Birth/Sex: ?Treating RN: ?1991/02/16 (30 y.o. Kelli Hudson, Kelli Hudson ?Primary Care Gwendelyn Lanting: Arlester Marker ?Other Clinician: ?Referring Welby Montminy: ?Treating Jnai Snellgrove/Extender: Fredirick Maudlin ?Arlester Marker ?Weeks in Treatment: 10 ?Active Inactive ?Nutrition ?Nursing Diagnoses: ?Impaired glucose control: actual or potential ?Goals: ?Patient/caregiver verbalizes understanding of need to maintain therapeutic glucose control per primary care physician ?Date Initiated: 02/14/2021 ?Target Resolution Date: 05/03/2021 ?Goal Status: Active ?Interventions: ?Assess HgA1c results as ordered upon admission and as needed ?Provide education on elevated blood sugars and impact on wound healing ?Notes: ?Wound/Skin Impairment ?Nursing Diagnoses: ?Impaired tissue integrity ?Goals: ?Patient/caregiver will verbalize understanding of skin care regimen ?Date Initiated: 02/14/2021 ?Target Resolution Date: 05/10/2021 ?Goal Status: Active ?Ulcer/skin breakdown will have a volume reduction of 30% by week 4 ?Date Initiated: 02/14/2021 ?Date Inactivated: 03/25/2021 ?Target Resolution Date: 03/25/2021 ?Goal Status: Met ?Interventions: ?Assess patient/caregiver ability to obtain necessary supplies ?Assess patient/caregiver ability to perform ulcer/skin care regimen upon admission and as needed ?Assess ulceration(s) every visit ?Provide education on ulcer and skin care ?Treatment Activities: ?Topical wound management initiated : 02/14/2021 ?Notes: ?Electronic Signature(s) ?Signed: 05/03/2021 12:57:05 PM By: Rhae Hammock RN ?Entered By: Rhae Hammock on 04/29/2021  15:40:38 ?-------------------------------------------------------------------------------- ?Pain Assessment Details ?Patient Name: ?Date of Service: ?Kelli Hudson [facility] 04/29/2021 2:45 PM ?Medical Record Number: [id number] ?Patient Account Number: [id number]

## 2021-05-08 ENCOUNTER — Encounter: Payer: Self-pay | Admitting: Internal Medicine

## 2021-05-13 ENCOUNTER — Encounter (HOSPITAL_BASED_OUTPATIENT_CLINIC_OR_DEPARTMENT_OTHER): Payer: 59 | Admitting: Internal Medicine

## 2021-05-15 ENCOUNTER — Ambulatory Visit (HOSPITAL_COMMUNITY)
Admission: RE | Admit: 2021-05-15 | Discharge: 2021-05-15 | Disposition: A | Discharge status: Home / Self Care | Payer: 59 | Source: Ambulatory Visit | Attending: Internal Medicine | Admitting: Internal Medicine

## 2021-05-15 ENCOUNTER — Other Ambulatory Visit: Payer: Self-pay

## 2021-05-15 ENCOUNTER — Other Ambulatory Visit: Payer: 59

## 2021-05-15 DIAGNOSIS — E042 Nontoxic multinodular goiter: Secondary | ICD-10-CM | POA: Insufficient documentation

## 2021-05-19 ENCOUNTER — Other Ambulatory Visit: Payer: Self-pay | Admitting: Family Medicine

## 2021-05-20 ENCOUNTER — Encounter (HOSPITAL_BASED_OUTPATIENT_CLINIC_OR_DEPARTMENT_OTHER): Payer: 59 | Attending: General Surgery | Admitting: Internal Medicine

## 2021-05-20 DIAGNOSIS — I10 Essential (primary) hypertension: Secondary | ICD-10-CM | POA: Diagnosis not present

## 2021-05-20 DIAGNOSIS — E10621 Type 1 diabetes mellitus with foot ulcer: Secondary | ICD-10-CM | POA: Insufficient documentation

## 2021-05-20 DIAGNOSIS — E1042 Type 1 diabetes mellitus with diabetic polyneuropathy: Secondary | ICD-10-CM | POA: Diagnosis not present

## 2021-05-20 DIAGNOSIS — L97528 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ty: Secondary | ICD-10-CM | POA: Diagnosis not present

## 2021-05-20 DIAGNOSIS — Z89422 Acquired absence of other left toe(s): Secondary | ICD-10-CM | POA: Insufficient documentation

## 2021-05-20 NOTE — Progress Notes (Addendum)
Kelli, Hudson ([id number]) Visit Report for 05/20/2021 Arrival Information Details Patient Name: Date of Service: Kelli Hudson [facility] 05/20/2021 3:00 PM Medical Record Number: [id number] Patient Account Number: [id number] Date of Birth/Sex: Treating RN: 12-02-1990 (31 y.o. Female) Rhae Hammock Primary Care Madysun Thall: Arlester Marker Other Clinician: Referring Gevork Ayyad: Treating Anetta Olvera/Extender: Fermin Schwab in Treatment: 5 Visit Information History Since Last Visit Added or deleted any medications: No Patient Arrived: Ambulatory Any new allergies or adverse reactions: No Arrival Time: 15:03 Had a fall or experienced change in No Accompanied By: boyfriend activities of daily living that may affect Transfer Assistance: None risk of falls: Patient Identification Verified: Yes Signs or symptoms of abuse/neglect since last visito No Secondary Verification Process Completed: Yes Hospitalized since last visit: No Patient Requires Transmission-Based Precautions: No Implantable device outside of the clinic excluding No Patient Has Alerts: No cellular tissue based products placed in the center since last visit: Has Dressing in Place as Prescribed: Yes Pain Present Now: No Electronic Signature(s) Signed: 05/20/2021 3:48:01 PM By: Rhae Hammock RN Entered By: Rhae Hammock on 05/20/2021 15:09:59 -------------------------------------------------------------------------------- Encounter Discharge Information Details Patient Name: Date of Service: Kelli Hudson [facility] 05/20/2021 3:00 PM Medical Record Number: [id number] Patient Account Number: [id number] Date of Birth/Sex: Treating RN: 05-Nov-1990 (31 y.o. Female) Rhae Hammock Primary Care Norris Bodley: Arlester Marker Other Clinician: Referring Azir Muzyka: Treating Meeya Goldin/Extender: Fermin Schwab in Treatment: 13 Encounter Discharge Information Items Post Procedure  Vitals Discharge Condition: Stable Temperature (F): 98.7 Ambulatory Status: Ambulatory Pulse (bpm): 74 Discharge Destination: Home Respiratory Rate (breaths/min): 17 Transportation: Private Auto Blood Pressure (mmHg): 134/74 Accompanied By: boyfrien Schedule Follow-up Appointment: Yes Clinical Summary of Care: Patient Declined Electronic Signature(s) Signed: 05/20/2021 3:48:01 PM By: Rhae Hammock RN Entered By: Rhae Hammock on 05/20/2021 15:38:04 -------------------------------------------------------------------------------- Lower Extremity Assessment Details Patient Name: Date of Service: Kelli Hudson [facility] 05/20/2021 3:00 PM Medical Record Number: [id number] Patient Account Number: [id number] Date of Birth/Sex: Treating RN: Dec 09, 1990 (31 y.o. Female) Rhae Hammock Primary Care Andra Heslin: Arlester Marker Other Clinician: Referring Sabryn Preslar: Treating Jordanne Elsbury/Extender: Fermin Schwab in Treatment: 13 Edema Assessment Assessed: [name]: Yes] Patrice Paradise: No] Edema: [Left: N] [Right: o] Calf Left: Right: Point of Measurement: 31 cm From Medial Instep 37 cm Ankle Left: Right: Point of Measurement: 10 cm From Medial Instep 23.8 cm Vascular Assessment Pulses: Dorsalis Pedis Palpable: [Left:Yes] Posterior Tibial Palpable: [Left:Yes] Electronic Signature(s) Signed: 05/20/2021 3:48:01 PM By: Rhae Hammock RN Entered By: Rhae Hammock on 05/20/2021 15:10:41 -------------------------------------------------------------------------------- Multi Wound Chart Details Patient Name: Date of Service: Kelli Hudson [facility] 05/20/2021 3:00 PM Medical Record Number: [id number] Patient Account Number: [id number] Date of Birth/Sex: Treating RN: 1990-02-19 (31 y.o. Female) Deon Pilling Primary Care Talton Delpriore: Arlester Marker Other Clinician: Referring Lashauna Arpin: Treating Izabell Schalk/Extender: Fermin Schwab in Treatment:  13 Vital Signs Height(in): 68 Capillary Blood Glucose(mg/dl): 176 Weight(lbs): 183 Pulse(bpm): 76 Body Mass Index(BMI): 27.8 Blood Pressure(mmHg): 113/77 Temperature(F): 98.6 Respiratory Rate(breaths/min): 17 Photos: [1:Left, Dorsal Foot] [2:Left, Lateral Foot] [N/A:N/A N/A] Wound Location: [1:Gradually Appeared] [2:Blister] [N/A:N/A] Wounding Event: [1:Diabetic Wound/Ulcer of the Lower] [2:Diabetic Wound/Ulcer of the Lower] [N/A:N/A] Primary Etiology: [1:Extremity Anemia, Type I Diabetes, Neuropathy Anemia, Type I Diabetes, Neuropathy N/A] [2:Extremity] Comorbid History: [1:01/04/2021] [2:01/04/2021] [N/A:N/A] Date Acquired: [1:13] [2:13] [N/A:N/A] Weeks of Treatment: [1:Open] [2:Healed - Epithelialized] [N/A:N/A] Wound Status: [1:No] [2:No] [N/A:N/A] Wound Recurrence: [1:0.2x0.2x0.1] [2:0x0x0] [N/A:N/A] Measurements L x W x D (cm) [1:0.031] [2:0] [  N/A:N/A] A (cm) : rea [1:0.003] [2:0] [N/A:N/A] Volume (cm) : [1:99.90%] [2:100.00%] [N/A:N/A] % Reduction in A [1:rea: 99.90%] [2:100.00%] [N/A:N/A] % Reduction in Volume: [1:Grade 1] [2:Grade 1] [N/A:N/A] Classification: [1:Medium] [2:Medium] [N/A:N/A] Exudate A mount: [1:Serosanguineous] [2:Serosanguineous] [N/A:N/A] Exudate Type: [1:red, brown] [2:red, brown] [N/A:N/A] Exudate Color: [1:Distinct, outline attached] [2:Distinct, outline attached] [N/A:N/A] Wound Margin: [1:Medium (34-66%)] [2:Large (67-100%)] [N/A:N/A] Granulation A mount: [1:Red] [2:Red] [N/A:N/A] Granulation Quality: [1:Medium (34-66%)] [2:Small (1-33%)] [N/A:N/A] Necrotic A mount: [1:Fat Layer (Subcutaneous Tissue): Yes Fat Layer (Subcutaneous Tissue): Yes N/A] Exposed Structures: [1:Fascia: No Tendon: No Muscle: No Joint: No Bone: No Medium (34-66%)] [2:Fascia: No Tendon: No Muscle: No Joint: No Bone: No Medium (34-66%)] [N/A:N/A] Epithelialization: [1:Chemical/Enzymatic/Mechanical] [2:N/A] [N/A:N/A] Debridement: [1:N/A] [2:N/A] [N/A:N/A] Instrument:  [1:Minimum] [2:N/A] [N/A:N/A] Bleeding: [1:Pressure] [2:N/A] [N/A:N/A] Hemostasis A chieved: Debridement Treatment Response: Procedure was tolerated well [2:N/A] [N/A:N/A] Post Debridement Measurements L x 0.2x0.2x0.1 [2:N/A] [N/A:N/A] W x D (cm) [1:0.003] [2:N/A] [N/A:N/A] Post Debridement Volume: (cm) [1:Debridement] [2:N/A] [N/A:N/A] Treatment Notes Wound #1 (Foot) Wound Laterality: Dorsal, Left Cleanser Soap and Water Discharge Instruction: May shower and wash wound with dial antibacterial soap and water prior to dressing change. Peri-Wound Care Sween Lotion (Moisturizing lotion) Discharge Instruction: Apply moisturizing lotion as directed Topical Vaseline Discharge Instruction: Apply Vaseline to scabbed area. Primary Dressing Santyl Ointment Discharge Instruction: Apply nickel thick amount to wound bed as instructed Secondary Dressing Woven Gauze Sponge, Non-Sterile 4x4 in Discharge Instruction: Apply over primary dressing as directed. Bordered Gauze, 2x2 in Discharge Instruction: Apply over primary dressing as directed. Secured With Compression Wrap Compression Stockings Add-Ons Wound #2 (Foot) Wound Laterality: Left, Lateral Cleanser Peri-Wound Care Topical Primary Dressing Secondary Dressing Secured With Compression Wrap Compression Stockings Add-Ons Electronic Signature(s) Signed: 05/20/2021 3:49:36 PM By: Geralyn Corwin DO Signed: 05/20/2021 4:31:56 PM By: Shawn Stall RN, BSN Entered By: Geralyn Corwin on 05/20/2021 15:42:18 -------------------------------------------------------------------------------- Multi-Disciplinary Care Plan Details Patient Name: Date of Service: Kelli Hudson [facility] 05/20/2021 3:00 PM Medical Record Number: [id number] Patient Account Number: [id number] Date of Birth/Sex: Treating RN: 01/30/1991 (31 y.o. Female) Fonnie Mu Primary Care Abeeha Twist: [name] Other Clinician: Referring Cheyla Duchemin: Treating  Braulio Kiedrowski/Extender: Kevin Fenton in Treatment: 13 Active Inactive Electronic Signature(s) Signed: 07/02/2021 7:59:48 AM By: Shawn Stall RN, BSN Signed: 07/08/2021 3:58:07 PM By: Fonnie Mu RN Previous Signature: 05/20/2021 3:48:01 PM Version By: Fonnie Mu RN Entered By: Shawn Stall on 07/02/2021 07:59:47 -------------------------------------------------------------------------------- Pain Assessment Details Patient Name: Date of Service: Kelli Hudson [facility] 05/20/2021 3:00 PM Medical Record Number: [id number] Patient Account Number: [id number] Date of Birth/Sex: Treating RN: [birth year] (31 y.o. Female) Fonnie Mu Primary Care Lucien Budney: [name] Other Clinician: Referring Erynn Vaca: Treating Sable Knoles/Extender: Kevin Fenton in Treatment: 13 Active Problems Location of Pain Severity and Description of Pain Patient Has Paino No Site Locations Pain Management and Medication Current Pain Management: Electronic Signature(s) Signed: 05/20/2021 3:48:01 PM By: Fonnie Mu RN Entered By: Fonnie Mu on 05/20/2021 15:10:33 -------------------------------------------------------------------------------- Patient/Caregiver Education Details Patient Name: Date of Service: Kelli Hudson [facility] 4/3/2023andnbsp3:00 PM Medical Record Number: [id number] Patient Account Number: [id number] Date of Birth/Gender: Treating RN: 1990-11-01 (31 y.o. Female) Fonnie Mu Primary Care Physician: [name] Other Clinician: Referring Physician: Treating Physician/Extender: Kevin Fenton in Treatment: 13 Education Assessment Education Provided To: Patient Education Topics Provided Wound/Skin Impairment: Methods: Explain/Verbal Responses: State content correctly [employer]) Signed: 05/20/2021 3:48:01 PM By: Fonnie Mu RN Entered By: Fonnie Mu  on  05/20/2021 15:35:45 -------------------------------------------------------------------------------- Wound Assessment Details Patient Name: Date of Service: Kelli Hudson [facility] 05/20/2021 3:00 PM Medical Record Number: [id number] Patient Account Number: [id number] Date of Birth/Sex: Treating RN: 18-Aug-1990 (31 y.o. Female) Rhae Hammock Primary Care Cadey Bazile: Arlester Marker Other Clinician: Referring Laporcha Marchesi: Treating Daneesha Quinteros/Extender: Fermin Schwab in Treatment: 13 Wound Status Wound Number: 1 Primary Etiology: Diabetic Wound/Ulcer of the Lower Extremity Wound Location: Left, Dorsal Foot Wound Status: Open Wounding Event: Gradually Appeared Comorbid History: Anemia, Type I Diabetes, Neuropathy Date Acquired: 01/04/2021 Weeks Of Treatment: 13 Clustered Wound: No Photos Wound Measurements Length: (cm) 0.2 Width: (cm) 0.2 Depth: (cm) 0.1 Area: (cm) 0.031 Volume: (cm) 0.003 % Reduction in Area: 99.9% % Reduction in Volume: 99.9% Epithelialization: Medium (34-66%) Tunneling: No Undermining: No Wound Description Classification: Grade 1 Wound Margin: Distinct, outline attached Exudate Amount: Medium Exudate Type: Serosanguineous Exudate Color: red, brown Foul Odor After Cleansing: No Slough/Fibrino Yes Wound Bed Granulation Amount: Medium (34-66%) Exposed Structure Granulation Quality: Red Fascia Exposed: No Necrotic Amount: Medium (34-66%) Fat Layer (Subcutaneous Tissue) Exposed: Yes Tendon Exposed: No Muscle Exposed: No Joint Exposed: No Bone Exposed: No Electronic Signature(s) Signed: 05/20/2021 3:48:01 PM By: Rhae Hammock RN Entered By: Rhae Hammock on 05/20/2021 15:18:41 -------------------------------------------------------------------------------- Wound Assessment Details Patient Name: Date of Service: Kelli Hudson [facility] 05/20/2021 3:00 PM Medical Record Number: [id number] Patient Account Number:  [id number] Date of Birth/Sex: Treating RN: May 24, 1990 (32 y.o. Female) Rhae Hammock Primary Care Chrisy Hillebrand: Arlester Marker Other Clinician: Referring Jedaiah Rathbun: Treating Dyshaun Bonzo/Extender: Fermin Schwab in Treatment: 13 Wound Status Wound Number: 2 Primary Etiology: Diabetic Wound/Ulcer of the Lower Extremity Wound Location: Left, Lateral Foot Wound Status: Healed - Epithelialized Wounding Event: Blister Comorbid History: Anemia, Type I Diabetes, Neuropathy Date Acquired: 01/04/2021 Weeks Of Treatment: 13 Clustered Wound: No Photos Wound Measurements Length: (cm) Width: (cm) Depth: (cm) Area: (cm) Volume: (cm) 0 % Reduction in Area: 100% 0 % Reduction in Volume: 100% 0 Epithelialization: Medium (34-66%) 0 0 Wound Description Classification: Grade 1 Wound Margin: Distinct, outline attached Exudate Amount: Medium Exudate Type: Serosanguineous Exudate Color: red, brown Foul Odor After Cleansing: No Slough/Fibrino Yes Wound Bed Granulation Amount: Large (67-100%) Exposed Structure Granulation Quality: Red Fascia Exposed: No Necrotic Amount: Small (1-33%) Fat Layer (Subcutaneous Tissue) Exposed: Yes Tendon Exposed: No Muscle Exposed: No Joint Exposed: No Bone Exposed: No Treatment Notes Wound #2 (Foot) Wound Laterality: Left, Lateral Cleanser Peri-Wound Care Topical Primary Dressing Secondary Dressing Secured With Compression Wrap Compression Stockings Add-Ons Electronic Signature(s) Signed: 05/20/2021 3:48:01 PM By: Rhae Hammock RN Entered By: Rhae Hammock on 05/20/2021 15:16:43 -------------------------------------------------------------------------------- Vitals Details Patient Name: Date of Service: Kelli Hudson [facility] 05/20/2021 3:00 PM Medical Record Number: [id number] Patient Account Number: [id number] Date of Birth/Sex: Treating RN: [birth year] (31 y.o. Female) Rhae Hammock Primary Care Savva Beamer:  Arlester Marker Other Clinician: Referring Tiannah Greenly: Treating Jeptha Hinnenkamp/Extender: Fermin Schwab in Treatment: 13 Vital Signs Time Taken: 15:10 Temperature (F): 98.6 Height (in): 68 Pulse (bpm): 93 Weight (lbs): 183 Respiratory Rate (breaths/min): 17 Body Mass Index (BMI): 27.8 Blood Pressure (mmHg): 113/77 Capillary Blood Glucose (mg/dl): 176 Reference Range: 80 - 120 mg / dl Electronic Signature(s) Signed: 05/20/2021 3:48:01 PM By: Rhae Hammock RN Entered By: Rhae Hammock on 05/20/2021 15:10:26

## 2021-05-20 NOTE — Progress Notes (Signed)
Kelli Hudson, Kelli Hudson ([id number]) ?Visit Report for 05/20/2021 ?Chief Complaint Document Details ?Patient Name: Date of Service: ?Kelli Hudson [facility] 05/20/2021 3:00 PM ?Medical Record Number: [id number] ?Patient Account Number: [id number] ?Date of Birth/Sex: Treating RN: ?1990-04-03 (30 y.o. F) Deaton, Kelli Hudson ?Primary Care Provider: Arlester Marker Other Clinician: ?Referring Provider: ?Treating Provider/Extender: Kalman Shan ?Arlester Marker ?Weeks in Treatment: 13 ?Information Obtained from: Patient ?Chief Complaint ?Left foot wounds ?Electronic Signature(s) ?Signed: 05/20/2021 3:49:36 PM By: Kalman Shan DO ?Entered By: Kalman Shan on 05/20/2021 15:42:34 ?-------------------------------------------------------------------------------- ?Debridement Details ?Patient Name: Date of Service: ?Kelli Hudson [facility] 05/20/2021 3:00 PM ?Medical Record Number: [id number] ?Patient Account Number: [id number] ?Date of Birth/Sex: Treating RN: ?08-Sep-1990 (30 y.o. Kelli Hudson, Kelli Hudson ?Primary Care Provider: Arlester Marker Other Clinician: ?Referring Provider: ?Treating Provider/Extender: Kalman Shan ?Arlester Marker ?Weeks in Treatment: 13 ?Debridement Performed for Assessment: Wound #1 Left,Dorsal Foot ?Performed By: Physician Kalman Shan, DO ?Debridement Type: Chemical/Enzymatic/Mechanical ?Agent Used: Santyl ?Severity of Tissue Pre Debridement: Fat layer exposed ?Level of Consciousness (Pre-procedure): Awake and Alert ?Pre-procedure Verification/Time Out No ?Taken: ?Bleeding: Minimum ?Hemostasis Achieved: Pressure ?Response to Treatment: Procedure was tolerated well ?Level of Consciousness (Post- Awake and Alert ?procedure): ?Post Debridement Measurements of Total Wound ?Length: (cm) 0.2 ?Width: (cm) 0.2 ?Depth: (cm) 0.1 ?Volume: (cm?) 0.003 ?Character of Wound/Ulcer Post Debridement: Improved ?Severity of Tissue Post Debridement: Fat layer exposed ?Post Procedure Diagnosis ?Same as  Pre-procedure ?Electronic Signature(s) ?Signed: 05/20/2021 3:48:01 PM By: Rhae Hammock RN ?Signed: 05/20/2021 3:49:36 PM By: Kalman Shan DO ?Signed: 05/20/2021 3:49:36 PM By: Kalman Shan DO ?Entered By: Rhae Hammock on 05/20/2021 15:36:47 ?-------------------------------------------------------------------------------- ?HPI Details ?Patient Name: Date of Service: ?Kelli Hudson [facility] 05/20/2021 3:00 PM ?Medical Record Number: [id number] ?Patient Account Number: [id number] ?Date of Birth/Sex: Treating RN: ?[birth year] (30 y.o. F) Deaton, Kelli Hudson ?Primary Care Provider: Arlester Marker Other Clinician: ?Referring Provider: ?Treating Provider/Extender: Kalman Shan ?Arlester Marker ?Weeks in Treatment: 13 ?History of Present Illness ?HPI Description: Admission 02/14/2021 ?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 While she was treating this wound she subsequently developed 3 additional wounds. These are located to the dorsal aspect ?and lateral aspect of the left foot and third toe. The wounds started out as blisters and she thinks these developed due to putting on the dressing too tightly ?around her foot. Currently she has been using Santyl to the wound beds however she has been leaving this to air dry throughout the day. She reports finishing ?doxycycline and currently denies signs of infection. ?1/6; patient presents for follow-up. She has been using Santyl to the areas of eschar and Hydrofera Blue to the toe wound. She reports improvement in wound ?healing. She has no issues or complaints today. She denies signs of infection. ?1/23; patient presents for follow-up. She has been using Santyl to the  wound beds without issues. She denies signs of infection. ?1/30; patient presents for follow-up. She has been using Santyl to the lateral left foot wound and Santyl and Hydrofera Blue to the dorsal left foot wound. She ?denies signs of infection. She has no issues or complaints today. ?2/6; patient presents for follow-up. She has no issues or complaints today. She denies signs of infection. ?2/27; patient  presents for follow-up. She has no issues or complaints today. She continues to use Santyl and [employer]. She denies signs of infection. ?2/13; patient presents for follow-up. She has no issues or complaints today. She denies signs of infection. ?04/29/2021: Here for follow-up, no concerns or complaints. No concern for infection. Just using Hydrofera Blue at this time. ?4/3; patient presents for follow-up. She has no issues or complaints today. She has not been placing anything to the wound beds. She denies signs of ?infection. ?Electronic Signature(s) ?Signed: 05/20/2021 3:49:36 PM By: Kalman Shan DO ?Entered By: Kalman Shan on 05/20/2021 15:43:37 ?-------------------------------------------------------------------------------- ?Physical Exam Details ?Patient Name: Date of Service: ?Kelli Hudson [facility] 05/20/2021 3:00 PM ?Medical Record Number: [id number] ?Patient Account Number: [id number] ?Date of Birth/Sex: Treating RN: ?[birth year] (30 y.o. F) Deaton, Kelli Hudson ?Primary Care Provider: Arlester Marker Other Clinician: ?Referring Provider: ?Treating Provider/Extender: Kalman Shan ?Arlester Marker ?Weeks in Treatment: 13 ?Constitutional ?respirations regular, non-labored and within target range for patient.[name] ?Cardiovascular ?2+ dorsalis pedis/posterior tibialis pulses. ?Psychiatric ?pleasant and cooperative. ?Notes ?Left foot: T the lateral aspect there is epithelization to the previous wound site. T the dorsal aspect there is a small open wound with granulation tissue ?o o ?present. No signs of  surrounding infection. ?Electronic Signature(s) ?Signed: 05/20/2021 3:49:36 PM By: Kalman Shan DO ?Entered By: Kalman Shan on 05/20/2021 15:44:36 ?-------------------------------------------------------------------------------- ?Physician Orders Details ?Patient Name: Date of Service: ?Kelli Hudson [facility] 05/20/2021 3:00 PM ?Medical Record Number: [id number] ?Patient Account Number: [id number] ?Date of Birth/Sex: Treating RN: ?1990-07-08 (30 y.o. Kelli Hudson, Kelli Hudson ?Primary Care Provider: Arlester Marker Other Clinician: ?Referring Provider: ?Treating Provider/Extender: Kalman Shan ?Arlester Marker ?Weeks in Treatment: 13 ?Verbal / Phone Orders: No ?Diagnosis Coding ?Follow-up Appointments ?ppointment in 2 weeks. - DR. Brien Mates, RN Room # 8 ?Return A ?Bathing/ Shower/ Hygiene ?May shower with protection but do not get wound dressing(s) wet. ?Off-Loading ?Other: - Open T Boot (given by Podiatry) ?oe ?Additional Orders / Instructions ?Follow Nutritious Diet - Monitor/Control Blood Sugars to aide in wound healing. ?Wound Treatment ?Wound #1 - Foot Wound Laterality: Dorsal, Left ?Cleanser: Soap and Water Every Other Day/15 Days ?Discharge Instructions: May shower and wash wound with dial antibacterial soap and water prior to dressing change. ?Peri-Wound Care: Sween Lotion (Moisturizing lotion) Every Other Day/15 Days ?Discharge Instructions: Apply moisturizing lotion as directed ?Topical: Vaseline Every Other Day/15 Days ?Discharge Instructions: Apply Vaseline to scabbed area. ?Prim Dressing: Santyl Ointment Every Other Day/15 Days ?ary ?Discharge Instructions: Apply nickel thick amount to wound bed as instructed ?Secondary Dressing: Woven Gauze Sponge, Non-Sterile 4x4 in (Generic) Every Other Day/15 Days ?Discharge Instructions: Apply over primary dressing as directed. ?Secondary Dressing: Bordered Gauze, 2x2 in Every Other Day/15 Days ?Discharge Instructions: Apply over primary dressing as  directed. ?Electronic Signature(s) ?Signed: 05/20/2021 3:49:36 PM By: Kalman Shan DO ?Entered By: Kalman Shan on 05/20/2021 15:45:22 ?-------------------------------------------------------------------------------- ?Problem Nicoletta Dress

## 2021-05-22 ENCOUNTER — Telehealth: Payer: 59

## 2021-05-23 ENCOUNTER — Telehealth: Payer: Self-pay | Admitting: Internal Medicine

## 2021-05-23 ENCOUNTER — Ambulatory Visit: Payer: 59 | Admitting: Podiatry

## 2021-05-23 NOTE — Telephone Encounter (Signed)
-----   Message from Marylynn Pearson sent at 05/23/2021  2:24 PM EDT ----- ?Regarding: RE: Biopsy ?Per the radiologist  none of the nodules meet criteria to biopsy? ? ?Is there something else going on? Family history of thyroid cancer?  ? ?If you want, you can speak with Dr. Bryn Gulling @ [phone] ? ?Thank you, ?Sheralyn Boatman   ?----- Message ----- ?From: Tayron Hunnell, Konrad Dolores, MD ?Sent: 05/23/2021   1:26 PM EDT ?To: Marylynn Pearson ?Subject: RE: Biopsy                                    ? ?Yes please the right mid 1.3 cm and Right inferior 1.5 cm  ?----- Message ----- ?From: Marylynn Pearson ?Sent: 05/23/2021   1:00 PM EDT ?To: Orland Penman, MD ?Subject: Biopsy                                        ? ? ? ?Dr. Lonzo Cloud, ? ?I am just checking to see if this biopsy is still needed? ? ?Thank you, ?Sheralyn Boatman ?Team Lead  ?Centralized Scheduling ?[place]  ? ? ? ?

## 2021-05-27 ENCOUNTER — Other Ambulatory Visit: Payer: Self-pay

## 2021-05-27 ENCOUNTER — Inpatient Hospital Stay (HOSPITAL_COMMUNITY)
Admission: EM | Admit: 2021-05-27 | Discharge: 2021-05-30 | DRG: 074 | Disposition: A | Discharge status: Home / Self Care | Payer: 59 | Attending: Internal Medicine | Admitting: Internal Medicine

## 2021-05-27 DIAGNOSIS — E785 Hyperlipidemia, unspecified: Secondary | ICD-10-CM | POA: Diagnosis present

## 2021-05-27 DIAGNOSIS — E1069 Type 1 diabetes mellitus with other specified complication: Secondary | ICD-10-CM | POA: Diagnosis present

## 2021-05-27 DIAGNOSIS — E1043 Type 1 diabetes mellitus with diabetic autonomic (poly)neuropathy: Principal | ICD-10-CM | POA: Diagnosis present

## 2021-05-27 DIAGNOSIS — Z800 Family history of malignant neoplasm of digestive organs: Secondary | ICD-10-CM

## 2021-05-27 DIAGNOSIS — E860 Dehydration: Secondary | ICD-10-CM | POA: Diagnosis present

## 2021-05-27 DIAGNOSIS — Z801 Family history of malignant neoplasm of trachea, bronchus and lung: Secondary | ICD-10-CM

## 2021-05-27 DIAGNOSIS — R1110 Vomiting, unspecified: Principal | ICD-10-CM

## 2021-05-27 DIAGNOSIS — D72829 Elevated white blood cell count, unspecified: Secondary | ICD-10-CM | POA: Diagnosis present

## 2021-05-27 DIAGNOSIS — I152 Hypertension secondary to endocrine disorders: Secondary | ICD-10-CM | POA: Diagnosis present

## 2021-05-27 DIAGNOSIS — N179 Acute kidney failure, unspecified: Secondary | ICD-10-CM

## 2021-05-27 DIAGNOSIS — Z8041 Family history of malignant neoplasm of ovary: Secondary | ICD-10-CM

## 2021-05-27 DIAGNOSIS — Z803 Family history of malignant neoplasm of breast: Secondary | ICD-10-CM

## 2021-05-27 DIAGNOSIS — Z89422 Acquired absence of other left toe(s): Secondary | ICD-10-CM

## 2021-05-27 DIAGNOSIS — N1832 Chronic kidney disease, stage 3b: Secondary | ICD-10-CM

## 2021-05-27 DIAGNOSIS — K219 Gastro-esophageal reflux disease without esophagitis: Secondary | ICD-10-CM | POA: Diagnosis present

## 2021-05-27 DIAGNOSIS — E109 Type 1 diabetes mellitus without complications: Secondary | ICD-10-CM

## 2021-05-27 DIAGNOSIS — E1159 Type 2 diabetes mellitus with other circulatory complications: Secondary | ICD-10-CM | POA: Diagnosis present

## 2021-05-27 DIAGNOSIS — Z794 Long term (current) use of insulin: Secondary | ICD-10-CM

## 2021-05-27 DIAGNOSIS — E1065 Type 1 diabetes mellitus with hyperglycemia: Secondary | ICD-10-CM

## 2021-05-27 DIAGNOSIS — R112 Nausea with vomiting, unspecified: Secondary | ICD-10-CM | POA: Diagnosis present

## 2021-05-27 DIAGNOSIS — E1042 Type 1 diabetes mellitus with diabetic polyneuropathy: Secondary | ICD-10-CM

## 2021-05-27 DIAGNOSIS — K3184 Gastroparesis: Secondary | ICD-10-CM | POA: Diagnosis present

## 2021-05-27 DIAGNOSIS — Z833 Family history of diabetes mellitus: Secondary | ICD-10-CM

## 2021-05-27 DIAGNOSIS — Z8711 Personal history of peptic ulcer disease: Secondary | ICD-10-CM

## 2021-05-27 LAB — CBC WITH DIFFERENTIAL/PLATELET
Abs Immature Granulocytes: 0.03 10*3/uL (ref 0.00–0.07)
Basophils Absolute: 0 10*3/uL (ref 0.0–0.1)
Basophils Relative: 0 %
Eosinophils Absolute: 0.1 10*3/uL (ref 0.0–0.5)
Eosinophils Relative: 1 %
HCT: 41.3 % (ref 36.0–46.0)
Hemoglobin: 14 g/dL (ref 12.0–15.0)
Immature Granulocytes: 0 %
Lymphocytes Relative: 20 %
Lymphs Abs: 2.5 10*3/uL (ref 0.7–4.0)
MCH: 28.9 pg (ref 26.0–34.0)
MCHC: 33.9 g/dL (ref 30.0–36.0)
MCV: 85.2 fL (ref 80.0–100.0)
Monocytes Absolute: 0.6 10*3/uL (ref 0.1–1.0)
Monocytes Relative: 5 %
Neutro Abs: 9.6 10*3/uL — ABNORMAL HIGH (ref 1.7–7.7)
Neutrophils Relative %: 74 %
Platelets: 375 10*3/uL (ref 150–400)
RBC: 4.85 MIL/uL (ref 3.87–5.11)
RDW: 13.2 % (ref 11.5–15.5)
WBC: 12.8 10*3/uL — ABNORMAL HIGH (ref 4.0–10.5)
nRBC: 0 % (ref 0.0–0.2)

## 2021-05-27 LAB — URINALYSIS, ROUTINE W REFLEX MICROSCOPIC
Bilirubin Urine: NEGATIVE
Glucose, UA: >=500 mg/dL — AB
Ketones, ur: 5 mg/dL — AB
Leukocytes,Ua: NEGATIVE
Nitrite: NEGATIVE
Protein, ur: >=300 mg/dL — AB
Specific Gravity, Urine: 1.022 (ref 1.005–1.030)
pH: 5 (ref 5.0–8.0)

## 2021-05-27 LAB — COMPREHENSIVE METABOLIC PANEL
ALT: 27 U/L (ref 0–44)
AST: 24 U/L (ref 15–41)
Albumin: 3.6 g/dL (ref 3.5–5.0)
Alkaline Phosphatase: 101 U/L (ref 38–126)
Anion gap: 11 (ref 5–15)
BUN: 22 mg/dL — ABNORMAL HIGH (ref 6–20)
CO2: 24 mmol/L (ref 22–32)
Calcium: 9.6 mg/dL (ref 8.9–10.3)
Chloride: 102 mmol/L (ref 98–111)
Creatinine, Ser: 1.44 mg/dL — ABNORMAL HIGH (ref 0.44–1.00)
GFR, Estimated: 50 mL/min — ABNORMAL LOW (ref >60)
Glucose, Bld: 284 mg/dL — ABNORMAL HIGH (ref 70–99)
Potassium: 3.5 mmol/L (ref 3.5–5.1)
Sodium: 137 mmol/L (ref 135–145)
Total Bilirubin: 0.7 mg/dL (ref 0.3–1.2)
Total Protein: 7.6 g/dL (ref 6.5–8.1)

## 2021-05-27 LAB — MAGNESIUM: Magnesium: 1.7 mg/dL (ref 1.7–2.4)

## 2021-05-27 LAB — LIPASE, BLOOD: Lipase: 27 U/L (ref 11–51)

## 2021-05-27 MED ORDER — SODIUM CHLORIDE 0.9 % IV BOLUS
1000.0000 mL | Freq: Once | INTRAVENOUS | Status: AC
  Administered 2021-05-27: 1000 mL via INTRAVENOUS

## 2021-05-27 MED ORDER — DROPERIDOL 2.5 MG/ML IJ SOLN
2.5000 mg | Freq: Once | INTRAMUSCULAR | Status: AC
  Administered 2021-05-27: 2.5 mg via INTRAVENOUS
  Filled 2021-05-27: qty 2

## 2021-05-27 NOTE — ED Provider Notes (Addendum)
?[facility] DEPT ?Provider Note ? ? ?CSN: [id number] ?Arrival date & time: 05/27/21  2159 ? ?  ? ?History ? ?Chief Complaint  ?Patient presents with  ? Abdominal Pain  ? Vomiting  ? ? ?Kelli Hudson is a 31 y.o. female. ? ? ?Abdominal Pain ? ?Patient is a 31 year old female with a past medical history significant for diabetic gastroparesis, DM 1, abdominal pain, hypomagnesemia, hyperlipidemia, HHS  ? ?Seems that she has a history of poorly controlled diabetes ? ?She presented to the emergency room today with complaints of abdominal pain nausea vomiting seems that her symptoms began this morning.  She states it feels similar to how it has in the past when she has had gastroparesis pain. ? ?She is still using her insulin.  States that she used insulin earlier today.  She denies any chest pain apart from the burning sensation when she is vomiting.  Denies any vertigo but does endorse some lightheadedness.  Feels generally weak and fatigued.  No cough congestion diarrhea.  No other associate symptoms. ? ?  ? ?Home Medications ?Prior to Admission medications   ?Medication Sig Start Date End Date Taking? Authorizing Provider  ?albuterol (VENTOLIN HFA) 108 (90 Base) MCG/ACT inhaler Inhale 2 puffs into the lungs every 6 (six) hours as needed for shortness of breath.    [provider]  ?alum & mag hydroxide-simeth (MAALOX/MYLANTA) 200-200-20 MG/5ML suspension Take 30 mLs by mouth every 6 (six) hours as needed for indigestion or heartburn. 04/24/21   Aline August, MD  ?amoxicillin-clavulanate (AUGMENTIN) 875-125 MG tablet Take 1 tablet by mouth 2 (two) times daily. ?Patient taking differently: Take 1 tablet by mouth 2 (two) times daily. Start date :04/19/21 04/18/21   Trula Slade, DPM  ?atorvastatin (LIPITOR) 20 MG tablet Take 1 tablet (20 mg total) by mouth daily. 04/10/21   [name], MD  ?Blood Glucose Monitoring Suppl (TRUE METRIX METER) w/Device KIT 1 Device by  Does not apply route 4 (four) times daily. 07/05/19   Libby Maw, MD  ?Continuous Blood Gluc Receiver (DEXCOM G6 RECEIVER) DEVI USE AS DIRECTED ?Patient taking differently: 1 each by Other route as directed. 11/21/20   [name], MD  ?Continuous Blood Gluc Sensor (DEXCOM G6 SENSOR) MISC 1 each by Does not apply route daily. 11/21/20   [name], MD  ?Continuous Blood Gluc Transmit (DEXCOM G6 TRANSMITTER) MISC 1 each by Does not apply route daily. 09/24/20   [name], MD  ?dicyclomine (BENTYL) 20 MG tablet Take 1 tablet (20 mg total) by mouth 3 (three) times daily as needed for spasms. 04/24/21   Aline August, MD  ?gabapentin (NEURONTIN) 300 MG capsule Take 1 capsule (300 mg total) by mouth 3 (three) times daily. 04/24/21   Aline August, MD  ?glucose blood (TRUE METRIX BLOOD GLUCOSE TEST) test strip Use as instructed 09/03/20   [name], MD  ?Insulin Pen Needle 31G X 5 MM MISC 1 Device by Does not apply route QID. For use with insulin pens 07/05/19   Libby Maw, MD  ?insulin regular (NOVOLIN R) 100 units/mL injection Inject 0-0.15 mLs (0-15 Units total) into the skin See admin instructions. Via pump 04/29/21   Shamleffer, Melanie Crazier, MD  ?lisinopril (ZESTRIL) 10 MG tablet Take 1 tablet (10 mg total) by mouth daily. 09/27/20   [name], MD  ?metoCLOPramide (REGLAN) 10 MG tablet Take 1 tablet (10 mg total) by mouth 4 (four) times daily -  before meals and at bedtime. 04/24/21 05/24/21  Aline August, MD  ?pantoprazole (PROTONIX) 40 MG tablet Take 1 tablet (40 mg total) by mouth 2 (two) times daily before a meal. 04/24/21   Aline August, MD  ?promethazine (PHENERGAN) 12.5 MG tablet Take 1 tablet (12.5 mg total) by mouth every 6 (six) hours as needed for nausea or vomiting. 04/24/21   Aline August, MD  ?traMADol (ULTRAM) 50 MG tablet Take 1 tablet (50 mg total) by mouth every 6 (six) hours as needed. 04/24/21   Aline August, MD  ?TRUEPLUS LANCETS 28G MISC assist with  checking blood sugar TID and qhs ?Patient taking differently: 1 each by Other route See admin instructions. assist with checking blood sugar TID and qhs 09/10/15   Brayton Caves, PA-C  ?insulin aspart (NOVOLOG) 100 UNIT/ML FlexPen Inject 5 Units into the skin 3 (three) times daily with meals. 02/23/18 07/05/19  Donne Hazel, MD  ?   ? ?Allergies    ?Patient has no known allergies.   ? ?Review of Systems   ?Review of Systems  ?Gastrointestinal:  Positive for abdominal pain.  ? ?Physical Exam ?Updated Vital Signs ?BP (!) 126/97   Pulse (!) 136   Temp 99.3 ?F (37.4 ?C) (Oral)   Resp 19   Ht _0  (1.727 m)   Wt 81.6 kg   SpO2 100%   BMI 27.37 kg/m?  ?Physical Exam ?Vitals and nursing note reviewed.  ?Constitutional:   ?   General: She is in acute distress.  ?   Comments: Patient is 30 year old female actively retching and uncomfortable appearing.  ?HENT:  ?   Head: Normocephalic and atraumatic.  ?   Nose: Nose normal.  ?   Mouth/Throat:  ?   Mouth: Mucous membranes are dry.  ?Eyes:  ?   General: No scleral icterus. ?Cardiovascular:  ?   Rate and Rhythm: Normal rate and regular rhythm.  ?   Pulses: Normal pulses.  ?   Heart sounds: Normal heart sounds.  ?Pulmonary:  ?   Effort: Pulmonary effort is normal. No respiratory distress.  ?   Breath sounds: No wheezing.  ?Abdominal:  ?   Palpations: Abdomen is soft.  ?   Tenderness: There is abdominal tenderness.  ?   Comments: Generalized abdominal tenderness.  No focal abdominal tenderness guarding or rebound.  ?Musculoskeletal:  ?   Cervical back: Normal range of motion.  ?   Right lower leg: No edema.  ?   Left lower leg: No edema.  ?Skin: ?   General: Skin is warm and dry.  ?   Capillary Refill: Capillary refill takes less than 2 seconds.  ?Neurological:  ?   Mental Status: She is alert. Mental status is at baseline.  ?Psychiatric:     ?   Mood and Affect: Mood normal.     ?   Behavior: Behavior normal.  ? ? ?ED Results / Procedures / Treatments   ?Labs ?(all labs  ordered are listed, but only abnormal results are displayed) ?Labs Reviewed  ?COMPREHENSIVE METABOLIC PANEL - Abnormal; Notable for the following components:  ?    Result Value  ? Glucose, Bld 284 (*)   ? BUN 22 (*)   ? Creatinine, Ser 1.44 (*)   ? GFR, Estimated 50 (*)   ? All other components within normal limits  ?CBC WITH DIFFERENTIAL/PLATELET - Abnormal; Notable for the following components:  ? WBC 12.8 (*)   ? Neutro Abs 9.6 (*)   ?  All other components within normal limits  ?URINALYSIS, ROUTINE W REFLEX MICROSCOPIC - Abnormal; Notable for the following components:  ? APPearance HAZY (*)   ? Glucose, UA >=500 (*)   ? Hgb urine dipstick SMALL (*)   ? Ketones, ur 5 (*)   ? Protein, ur >=300 (*)   ? Bacteria, UA RARE (*)   ? All other components within normal limits  ?LIPASE, BLOOD  ?MAGNESIUM  ?HCG, QUANTITATIVE, PREGNANCY  ?I-STAT BETA HCG BLOOD, ED ([facility], [facility], [facility] ONLY)  ? ? ?EKG ?None ? ?Radiology ?No results found. ? ?Procedures ?Ultrasound ED Peripheral IV (Provider) ? ?Date/Time: 05/28/2021 12:06 AM ?Performed by: Tedd Sias, PA ?Authorized by: Tedd Sias, PA  ? ?Procedure details:  ?  Indications: multiple failed IV attempts   ?  Skin Prep: chlorhexidine gluconate   ?  Location:  Left AC ?  Angiocath:  20 G ?  Bedside Ultrasound Guided: Yes   ?  Images: archived (in note)   ?  Patient tolerated procedure without complications: Yes   ?  Dressing applied: Yes   ?Comments:  ?   Patient has had multiple failed IV sticks.  Will require hydration and lab draw.  Will obtain this with ultrasound-guided IV.  ? ? ? ? ? ?Medications Ordered in ED ?Medications  ?sodium chloride 0.9 % bolus 1,000 mL (1,000 mLs Intravenous New Bag/Given 05/27/21 2354)  ?droperidol (INAPSINE) 2.5 MG/ML injection 2.5 mg (2.5 mg Intravenous Given 05/27/21 2350)  ?HYDROmorphone (DILAUDID) injection 0.5 mg (0.5 mg Intravenous Given 05/28/21 0006)  ? ? ?ED Course/ Medical Decision Making/ A&P ?Clinical Course as of 05/28/21 0503  ?Tue  May 28, 2021  ?0326 Discussed with Dr. Velia Meyer who will admit.  [WF]  ?  ?Clinical Course User Index ?[WF] Tedd Sias, [place]  ? ?                        ?Medical Decision Making ?Amount and/or Complexity of Data

## 2021-05-27 NOTE — ED Triage Notes (Signed)
Pt states that she is having a gastroparesis flare up since today. Pt complains of abdominal pain and vomiting.  ?

## 2021-05-27 NOTE — ED Notes (Signed)
Veins are flat and unable to obtain labs ?

## 2021-05-28 ENCOUNTER — Encounter (HOSPITAL_COMMUNITY): Payer: Self-pay | Admitting: Internal Medicine

## 2021-05-28 ENCOUNTER — Observation Stay (HOSPITAL_COMMUNITY): Payer: 59

## 2021-05-28 DIAGNOSIS — D72829 Elevated white blood cell count, unspecified: Secondary | ICD-10-CM | POA: Diagnosis present

## 2021-05-28 DIAGNOSIS — Z89422 Acquired absence of other left toe(s): Secondary | ICD-10-CM | POA: Diagnosis not present

## 2021-05-28 DIAGNOSIS — K3184 Gastroparesis: Secondary | ICD-10-CM | POA: Diagnosis present

## 2021-05-28 DIAGNOSIS — Z801 Family history of malignant neoplasm of trachea, bronchus and lung: Secondary | ICD-10-CM | POA: Diagnosis not present

## 2021-05-28 DIAGNOSIS — E1043 Type 1 diabetes mellitus with diabetic autonomic (poly)neuropathy: Secondary | ICD-10-CM | POA: Diagnosis present

## 2021-05-28 DIAGNOSIS — Z800 Family history of malignant neoplasm of digestive organs: Secondary | ICD-10-CM | POA: Diagnosis not present

## 2021-05-28 DIAGNOSIS — Z833 Family history of diabetes mellitus: Secondary | ICD-10-CM | POA: Diagnosis not present

## 2021-05-28 DIAGNOSIS — Z8711 Personal history of peptic ulcer disease: Secondary | ICD-10-CM | POA: Diagnosis not present

## 2021-05-28 DIAGNOSIS — Z8041 Family history of malignant neoplasm of ovary: Secondary | ICD-10-CM | POA: Diagnosis not present

## 2021-05-28 DIAGNOSIS — I152 Hypertension secondary to endocrine disorders: Secondary | ICD-10-CM | POA: Diagnosis present

## 2021-05-28 DIAGNOSIS — E785 Hyperlipidemia, unspecified: Secondary | ICD-10-CM | POA: Diagnosis present

## 2021-05-28 DIAGNOSIS — Z803 Family history of malignant neoplasm of breast: Secondary | ICD-10-CM | POA: Diagnosis not present

## 2021-05-28 DIAGNOSIS — N179 Acute kidney failure, unspecified: Secondary | ICD-10-CM | POA: Diagnosis present

## 2021-05-28 DIAGNOSIS — E860 Dehydration: Secondary | ICD-10-CM | POA: Diagnosis present

## 2021-05-28 DIAGNOSIS — Z794 Long term (current) use of insulin: Secondary | ICD-10-CM | POA: Diagnosis not present

## 2021-05-28 DIAGNOSIS — R112 Nausea with vomiting, unspecified: Secondary | ICD-10-CM | POA: Diagnosis not present

## 2021-05-28 DIAGNOSIS — E1065 Type 1 diabetes mellitus with hyperglycemia: Secondary | ICD-10-CM | POA: Diagnosis present

## 2021-05-28 DIAGNOSIS — K219 Gastro-esophageal reflux disease without esophagitis: Secondary | ICD-10-CM | POA: Diagnosis present

## 2021-05-28 DIAGNOSIS — E1069 Type 1 diabetes mellitus with other specified complication: Secondary | ICD-10-CM | POA: Diagnosis present

## 2021-05-28 LAB — COMPREHENSIVE METABOLIC PANEL
ALT: 23 U/L (ref 0–44)
AST: 19 U/L (ref 15–41)
Albumin: 2.8 g/dL — ABNORMAL LOW (ref 3.5–5.0)
Alkaline Phosphatase: 83 U/L (ref 38–126)
Anion gap: 9 (ref 5–15)
BUN: 22 mg/dL — ABNORMAL HIGH (ref 6–20)
CO2: 25 mmol/L (ref 22–32)
Calcium: 8.8 mg/dL — ABNORMAL LOW (ref 8.9–10.3)
Chloride: 103 mmol/L (ref 98–111)
Creatinine, Ser: 1.49 mg/dL — ABNORMAL HIGH (ref 0.44–1.00)
GFR, Estimated: 48 mL/min — ABNORMAL LOW (ref >60)
Glucose, Bld: 375 mg/dL — ABNORMAL HIGH (ref 70–99)
Potassium: 4.5 mmol/L (ref 3.5–5.1)
Sodium: 137 mmol/L (ref 135–145)
Total Bilirubin: 1.1 mg/dL (ref 0.3–1.2)
Total Protein: 6.4 g/dL — ABNORMAL LOW (ref 6.5–8.1)

## 2021-05-28 LAB — CBC WITH DIFFERENTIAL/PLATELET
Abs Immature Granulocytes: 0.03 10*3/uL (ref 0.00–0.07)
Basophils Absolute: 0 10*3/uL (ref 0.0–0.1)
Basophils Relative: 0 %
Eosinophils Absolute: 0 10*3/uL (ref 0.0–0.5)
Eosinophils Relative: 0 %
HCT: 34.1 % — ABNORMAL LOW (ref 36.0–46.0)
Hemoglobin: 11.2 g/dL — ABNORMAL LOW (ref 12.0–15.0)
Immature Granulocytes: 0 %
Lymphocytes Relative: 10 %
Lymphs Abs: 1.1 10*3/uL (ref 0.7–4.0)
MCH: 28.9 pg (ref 26.0–34.0)
MCHC: 32.8 g/dL (ref 30.0–36.0)
MCV: 87.9 fL (ref 80.0–100.0)
Monocytes Absolute: 0.2 10*3/uL (ref 0.1–1.0)
Monocytes Relative: 2 %
Neutro Abs: 10.2 10*3/uL — ABNORMAL HIGH (ref 1.7–7.7)
Neutrophils Relative %: 88 %
Platelets: 338 10*3/uL (ref 150–400)
RBC: 3.88 MIL/uL (ref 3.87–5.11)
RDW: 13.3 % (ref 11.5–15.5)
WBC: 11.6 10*3/uL — ABNORMAL HIGH (ref 4.0–10.5)
nRBC: 0 % (ref 0.0–0.2)

## 2021-05-28 LAB — CBC
HCT: 31.1 % — ABNORMAL LOW (ref 36.0–46.0)
Hemoglobin: 10.4 g/dL — ABNORMAL LOW (ref 12.0–15.0)
MCH: 28.9 pg (ref 26.0–34.0)
MCHC: 33.4 g/dL (ref 30.0–36.0)
MCV: 86.4 fL (ref 80.0–100.0)
Platelets: 332 10*3/uL (ref 150–400)
RBC: 3.6 MIL/uL — ABNORMAL LOW (ref 3.87–5.11)
RDW: 13.4 % (ref 11.5–15.5)
WBC: 13.2 10*3/uL — ABNORMAL HIGH (ref 4.0–10.5)
nRBC: 0 % (ref 0.0–0.2)

## 2021-05-28 LAB — CBG MONITORING, ED
Glucose-Capillary: 145 mg/dL — ABNORMAL HIGH (ref 70–99)
Glucose-Capillary: 151 mg/dL — ABNORMAL HIGH (ref 70–99)
Glucose-Capillary: 242 mg/dL — ABNORMAL HIGH (ref 70–99)
Glucose-Capillary: 301 mg/dL — ABNORMAL HIGH (ref 70–99)
Glucose-Capillary: 304 mg/dL — ABNORMAL HIGH (ref 70–99)
Glucose-Capillary: 349 mg/dL — ABNORMAL HIGH (ref 70–99)
Glucose-Capillary: 89 mg/dL (ref 70–99)

## 2021-05-28 LAB — MAGNESIUM: Magnesium: 1.6 mg/dL — ABNORMAL LOW (ref 1.7–2.4)

## 2021-05-28 LAB — HEMOGLOBIN A1C
Hgb A1c MFr Bld: 9.7 % — ABNORMAL HIGH (ref 4.8–5.6)
Mean Plasma Glucose: 231.69 mg/dL

## 2021-05-28 LAB — HCG, QUANTITATIVE, PREGNANCY: hCG, Beta Chain, Quant, S: <1 m[IU]/mL (ref <5)

## 2021-05-28 MED ORDER — DIPHENHYDRAMINE HCL 25 MG PO CAPS
25.0000 mg | ORAL_CAPSULE | Freq: Once | ORAL | Status: AC
  Administered 2021-05-28: 25 mg via ORAL
  Filled 2021-05-28: qty 1

## 2021-05-28 MED ORDER — INSULIN ASPART 100 UNIT/ML IJ SOLN
0.0000 [IU] | INTRAMUSCULAR | Status: DC
  Administered 2021-05-28: 5 [IU] via SUBCUTANEOUS
  Administered 2021-05-28: 2 [IU] via SUBCUTANEOUS
  Administered 2021-05-28: 11 [IU] via SUBCUTANEOUS
  Administered 2021-05-28 – 2021-05-29 (×2): 3 [IU] via SUBCUTANEOUS
  Administered 2021-05-29: 2 [IU] via SUBCUTANEOUS
  Filled 2021-05-28: qty 0.15

## 2021-05-28 MED ORDER — FENTANYL CITRATE PF 50 MCG/ML IJ SOSY
50.0000 ug | PREFILLED_SYRINGE | INTRAMUSCULAR | Status: DC | PRN
  Administered 2021-05-28 – 2021-05-29 (×4): 50 ug via INTRAVENOUS
  Filled 2021-05-28 (×4): qty 1

## 2021-05-28 MED ORDER — INSULIN ASPART 100 UNIT/ML IJ SOLN
0.0000 [IU] | Freq: Four times a day (QID) | INTRAMUSCULAR | Status: DC
  Administered 2021-05-28: 7 [IU] via SUBCUTANEOUS
  Filled 2021-05-28: qty 0.09

## 2021-05-28 MED ORDER — LABETALOL HCL 5 MG/ML IV SOLN
10.0000 mg | INTRAVENOUS | Status: DC | PRN
Start: 2021-05-28 — End: 2021-05-28

## 2021-05-28 MED ORDER — MAGNESIUM SULFATE 2 GM/50ML IV SOLN
2.0000 g | Freq: Once | INTRAVENOUS | Status: AC
  Administered 2021-05-28: 2 g via INTRAVENOUS
  Filled 2021-05-28: qty 50

## 2021-05-28 MED ORDER — ACETAMINOPHEN 650 MG RE SUPP: 650.0000 mg | Freq: Four times a day (QID) | RECTAL | Status: DC | PRN

## 2021-05-28 MED ORDER — HYDROMORPHONE HCL 1 MG/ML IJ SOLN
0.5000 mg | Freq: Once | INTRAMUSCULAR | Status: AC
  Administered 2021-05-28: 0.5 mg via INTRAVENOUS
  Filled 2021-05-28: qty 1

## 2021-05-28 MED ORDER — SODIUM CHLORIDE 0.9 % IV BOLUS
1000.0000 mL | Freq: Once | INTRAVENOUS | Status: AC
  Administered 2021-05-28: 1000 mL via INTRAVENOUS

## 2021-05-28 MED ORDER — METOCLOPRAMIDE HCL 5 MG/ML IJ SOLN
10.0000 mg | Freq: Three times a day (TID) | INTRAMUSCULAR | Status: DC
  Administered 2021-05-28 – 2021-05-30 (×7): 10 mg via INTRAVENOUS
  Filled 2021-05-28 (×7): qty 2

## 2021-05-28 MED ORDER — LORAZEPAM 2 MG/ML IJ SOLN: 0.5000 mg | Freq: Four times a day (QID) | INTRAMUSCULAR | Status: DC | PRN

## 2021-05-28 MED ORDER — NALOXONE HCL 0.4 MG/ML IJ SOLN
0.4000 mg | INTRAMUSCULAR | Status: DC | PRN
Start: 2021-05-28 — End: 2021-05-30

## 2021-05-28 MED ORDER — ENOXAPARIN SODIUM 40 MG/0.4ML IJ SOSY
40.0000 mg | PREFILLED_SYRINGE | INTRAMUSCULAR | Status: DC
  Administered 2021-05-28 – 2021-05-29 (×2): 40 mg via SUBCUTANEOUS
  Filled 2021-05-28 (×2): qty 0.4

## 2021-05-28 MED ORDER — LACTATED RINGERS IV SOLN: INTRAVENOUS | Status: DC

## 2021-05-28 MED ORDER — ACETAMINOPHEN 325 MG PO TABS
650.0000 mg | ORAL_TABLET | Freq: Four times a day (QID) | ORAL | Status: DC | PRN
  Administered 2021-05-28: 650 mg via ORAL
  Filled 2021-05-28: qty 2

## 2021-05-28 MED ORDER — METOCLOPRAMIDE HCL 5 MG/ML IJ SOLN
10.0000 mg | Freq: Once | INTRAMUSCULAR | Status: AC
  Administered 2021-05-28: 10 mg via INTRAVENOUS
  Filled 2021-05-28: qty 2

## 2021-05-28 MED ORDER — FENTANYL CITRATE PF 50 MCG/ML IJ SOSY: 50.0000 ug | PREFILLED_SYRINGE | INTRAMUSCULAR | Status: DC | PRN

## 2021-05-28 MED ORDER — ACETAMINOPHEN 325 MG PO TABS
650.0000 mg | ORAL_TABLET | Freq: Once | ORAL | Status: AC
  Administered 2021-05-28: 650 mg via ORAL
  Filled 2021-05-28: qty 2

## 2021-05-28 MED ORDER — METOPROLOL TARTRATE 5 MG/5ML IV SOLN: 5.0000 mg | Freq: Four times a day (QID) | INTRAVENOUS | Status: DC | PRN

## 2021-05-28 MED ORDER — ONDANSETRON HCL 4 MG/2ML IJ SOLN: 4.0000 mg | Freq: Four times a day (QID) | INTRAMUSCULAR | Status: DC | PRN

## 2021-05-28 NOTE — Progress Notes (Signed)
?  Carryover admission to the Day Admitter.  I discussed this case with the EDP, Solon Augusta, PA.  Per these discussions: ? ? ?This is a 31 year old female with history of poorly controlled diabetes complicated by diabetic gastroparesis with recurrent hospitalizations for intractable nausea/vomiting, who is being admitted for intractable nausea/vomiting after presenting with 1 to 2 days of such, associated with epigastric pain, is reportedly consistent with her prior presentations for intractable nausea/vomiting.  Nausea/vomiting persisted in spite of multiple antiemetic doses in the ED.  Blood sugar elevated into the 200s, but without any evidence of anion gap metabolic acidosis. ? ?I have placed an order for overnight observation for further evaluation management of intractable nausea/vomiting. ? ?I have placed some additional preliminary admit orders via the adult multi-morbid admission order set. I have also ordered continuous lactated Ringer's, prn Zofran, as needed IV Ativan for nausea/vomiting refractory to Zofran, as needed IV fentanyl.  Every 6 hours Accu-Cheks with associated sliding scale insulin.  Also ordered a fresh set of morning labs. ? ? ? ?Newton Pigg, DO ?Hospitalist ? ?

## 2021-05-28 NOTE — H&P (Signed)
?History and Physical  ? ? ?Patient: Kelli Hudson MWN:[id number] DOB: 26-Dec-1990 ?DOA: 05/27/2021 ?DOS: the patient was seen and examined on 05/28/2021 ?PCP: [name], MD  ?Patient coming from: Home ? ?Chief Complaint:  ?Chief Complaint  ?Patient presents with  ? Abdominal Pain  ? Vomiting  ? ?HPI: Kelli Hudson is a 30 y.o. female with medical history significant of Dmt1, gastroparesis, HTN. Presenting w/ N/V. Symptoms starting yesterday. Tried her normal nausea medication, but they didn't give complete relief. No new exotic foods. No sick contacts. No fevers. When her symptoms didn't resolve last night, she decided to come to the ED. She denies any other aggravating or alleviating factors.   ? ?Review of Systems: As mentioned in the history of present illness. All other systems reviewed and are negative. ?Past Medical History:  ?Diagnosis Date  ? Acute H. pylori gastric ulcer   ? Diabetes mellitus ([facility])   ? Diabetic gastroparesis ([place])   ? Gastroparesis   ? GERD (gastroesophageal reflux disease)   ? Hypertension   ? Hyperthyroidism   ? ?Past Surgical History:  ?Procedure Laterality Date  ? AMPUTATION TOE Left 03/10/2018  ? Procedure: AMPUTATION FIFTH TOE;  Surgeon: Trula Slade, DPM;  Location: [facility];  Service: Podiatry;  Laterality: Left;  ? BIOPSY  01/28/2020  ? Procedure: BIOPSY;  Surgeon: Otis Brace, MD;  Location: [facility];  Service: Gastroenterology;;  ? ESOPHAGOGASTRODUODENOSCOPY N/A 01/28/2020  ? Procedure: ESOPHAGOGASTRODUODENOSCOPY (EGD);  Surgeon: Otis Brace, MD;  Location: [facility];  Service: Gastroenterology;  Laterality: N/A;  ? ESOPHAGOGASTRODUODENOSCOPY (EGD) WITH PROPOFOL Left 09/08/2015  ? Procedure: ESOPHAGOGASTRODUODENOSCOPY (EGD) WITH PROPOFOL;  Surgeon: Arta Silence, MD;  Location: [facility] ENDOSCOPY;  Service: Endoscopy;  Laterality: Left;  ? WISDOM TOOTH EXTRACTION    ? ?Social History:  reports that she has never smoked. She  has never used smokeless tobacco. She reports current alcohol use of about 1.0 standard drink per week. She reports that she does not use drugs. ? ?No Known Allergies ? ?Family History  ?Problem Relation Age of Onset  ? Lung cancer Mother   ? Diabetes Mother   ? Bipolar disorder Father   ? Liver cancer Maternal Grandfather   ? Breast cancer Other   ?     maternal great aunt  ? Heart disease Other   ? Ovarian cancer Other   ?     maternal great aunt  ? Pancreatic cancer Maternal Uncle   ? Prostate cancer Maternal Uncle   ? Diabetes Maternal Aunt   ?     x 2  ? Kidney disease Other   ?     maternal great aunt  ? ? ?Prior to Admission medications   ?Medication Sig Start Date End Date Taking? Authorizing Provider  ?albuterol (VENTOLIN HFA) 108 (90 Base) MCG/ACT inhaler Inhale 2 puffs into the lungs every 6 (six) hours as needed for shortness of breath.    [provider]  ?alum & mag hydroxide-simeth (MAALOX/MYLANTA) 200-200-20 MG/5ML suspension Take 30 mLs by mouth every 6 (six) hours as needed for indigestion or heartburn. 04/24/21   Aline August, MD  ?amoxicillin-clavulanate (AUGMENTIN) 875-125 MG tablet Take 1 tablet by mouth 2 (two) times daily. ?Patient taking differently: Take 1 tablet by mouth 2 (two) times daily. Start date :04/19/21 04/18/21   Trula Slade, DPM  ?atorvastatin (LIPITOR) 20 MG tablet Take 1 tablet (20 mg total) by mouth daily. 04/10/21   [name], MD  ?Blood Glucose Monitoring  Suppl (TRUE METRIX METER) w/Device KIT 1 Device by Does not apply route 4 (four) times daily. 07/05/19   Libby Maw, MD  ?Continuous Blood Gluc Receiver (DEXCOM G6 RECEIVER) DEVI USE AS DIRECTED ?Patient taking differently: 1 each by Other route as directed. 11/21/20   [name], MD  ?Continuous Blood Gluc Sensor (DEXCOM G6 SENSOR) MISC 1 each by Does not apply route daily. 11/21/20   [name], MD  ?Continuous Blood Gluc Transmit (DEXCOM G6 TRANSMITTER) MISC 1 each by Does not apply  route daily. 09/24/20   [name], MD  ?dicyclomine (BENTYL) 20 MG tablet Take 1 tablet (20 mg total) by mouth 3 (three) times daily as needed for spasms. 04/24/21   Aline August, MD  ?gabapentin (NEURONTIN) 300 MG capsule Take 1 capsule (300 mg total) by mouth 3 (three) times daily. 04/24/21   Aline August, MD  ?glucose blood (TRUE METRIX BLOOD GLUCOSE TEST) test strip Use as instructed 09/03/20   [name], MD  ?Insulin Pen Needle 31G X 5 MM MISC 1 Device by Does not apply route QID. For use with insulin pens 07/05/19   Libby Maw, MD  ?insulin regular (NOVOLIN R) 100 units/mL injection Inject 0-0.15 mLs (0-15 Units total) into the skin See admin instructions. Via pump 04/29/21   Shamleffer, Melanie Crazier, MD  ?lisinopril (ZESTRIL) 10 MG tablet Take 1 tablet (10 mg total) by mouth daily. 09/27/20   [name], MD  ?metoCLOPramide (REGLAN) 10 MG tablet Take 1 tablet (10 mg total) by mouth 4 (four) times daily -  before meals and at bedtime. 04/24/21 05/24/21  Aline August, MD  ?pantoprazole (PROTONIX) 40 MG tablet Take 1 tablet (40 mg total) by mouth 2 (two) times daily before a meal. 04/24/21   Aline August, MD  ?promethazine (PHENERGAN) 12.5 MG tablet Take 1 tablet (12.5 mg total) by mouth every 6 (six) hours as needed for nausea or vomiting. 04/24/21   Aline August, MD  ?traMADol (ULTRAM) 50 MG tablet Take 1 tablet (50 mg total) by mouth every 6 (six) hours as needed. 04/24/21   Aline August, MD  ?TRUEPLUS LANCETS 28G MISC assist with checking blood sugar TID and qhs ?Patient taking differently: 1 each by Other route See admin instructions. assist with checking blood sugar TID and qhs 09/10/15   Brayton Caves, PA-C  ?insulin aspart (NOVOLOG) 100 UNIT/ML FlexPen Inject 5 Units into the skin 3 (three) times daily with meals. 02/23/18 07/05/19  Donne Hazel, MD  ? ? ?Physical Exam: ?Vitals:  ? 05/28/21 0530 05/28/21 0600 05/28/21 0630 05/28/21 0700  ?BP: (!) 91/51 (!) 92/50 95/68 96/60    ?Pulse: (!) 108 (!) 107 (!) 108 (!) 105  ?Resp: 16 12 12 12   ?Temp:    97.9 ?F (36.6 ?C)  ?TempSrc:      ?SpO2: 93% 93% 94% 94%  ?Weight:      ?Height:      ? ?General: 31 y.o. female resting in bed in NAD ?Eyes: PERRL, normal sclera ?ENMT: Nares patent w/o discharge, orophaynx clear, dentition normal, ears w/o discharge/lesions/ulcers ?Neck: Supple, trachea midline ?Cardiovascular: tachy, +S1, S2, no m/g/r, equal pulses throughout ?Respiratory: CTABL, no w/r/r, normal WOB ?GI: BS+, ND, mild global TTP, no masses noted, no organomegaly noted ?MSK: No e/c/c ?Neuro: A&O x 3, no focal deficits ?Psyc: Appropriate interaction and affect, calm/cooperative ? ?Data Reviewed: ?Glucose  375 ?BUN 22 ?Scr 1.49 ?Mg2+  1.6 ?WBC  11.6 ?Hgb  11.2 ? ?  Assessment and Plan: ?No notes have been filed under this hospital service. ?Service: Hospitalist ?Intractable N/V ?DM gastroparesis ?    - place in progressive, obs ?    - fluids, anti-emetics ?    - NPO except for sip/ice chips ? ?AKI ?Dehydration ?    - IVF, check renal US ? ?DMt1 uncontrolled ?    - SSI, glucose checks, resume home LA insulin ? ?HTN ?    - resume home regimen, will have PRNs available ? ?Hypomagnesemia ?    - replace Mg2+, K+ is ok ? ?Leukocytosis ?    - likely reactive; no fever, follow ? ?HLD ?    - resume home regimen when confirmed ? ? Advance Care Planning:   Code Status: Full Code ? ?Consults: None ? ?Family Communication: None at bedside ? ?Severity of Illness: ?The appropriate patient status for this patient is OBSERVATION. Observation status is judged to be reasonable and necessary in order to provide the required intensity of service to ensure the patient's safety. The patient's presenting symptoms, physical exam findings, and initial radiographic and laboratory data in the context of their medical condition is felt to place them at decreased risk for further clinical deterioration. Furthermore, it is anticipated that the patient will be medically stable  for discharge from the hospital within 2 midnights of admission.  ? ?Author: ?Jonnie Finner, DO ?05/28/2021 7:25 AM ? ?For on call review [email or web address].  ?

## 2021-05-28 NOTE — Progress Notes (Signed)
Inpatient Diabetes Program Recommendations ? ?AACE/ADA: New Consensus Statement on Inpatient Glycemic Control (2015) ? ?Target Ranges:  Prepandial:   less than 140 mg/dL ?     Peak postprandial:   less than 180 mg/dL (1-2 hours) ?     Critically ill patients:  140 - 180 mg/dL  ? ?Lab Results  ?Component Value Date  ? GLUCAP 242 (H) 05/28/2021  ? HGBA1C 9.9 (H) 02/25/2021  ? ? ?Review of Glycemic Control ? ?Diabetes history: DM1 ?Outpatient Diabetes medications: Insulin pump  ?Current orders for Inpatient glycemic control: Novolog 0-15 Q4H ? ?Pump settings: ?Basal - 0.5/H ?Bolus CHO ratio 1:12 ?CF 50 with goal of 110 mg/dL ? ?Endo: Shamleffer ?HgbA1C - 9.9% ?  ? ?Inpatient Diabetes Program Recommendations:   ? ?Add Semglee 9 units Q24H ?Will need meal coverage when eating, Novolog 3 units TID if eating > 50% ? ?See in am. ? ?Thank you. ?Ailene Ards, RD, LDN, CDE ?Inpatient Diabetes Coordinator ?[phone]  ? ? ? ? ? ? ?

## 2021-05-29 ENCOUNTER — Telehealth: Payer: 59

## 2021-05-29 DIAGNOSIS — K3184 Gastroparesis: Secondary | ICD-10-CM

## 2021-05-29 DIAGNOSIS — E1043 Type 1 diabetes mellitus with diabetic autonomic (poly)neuropathy: Secondary | ICD-10-CM

## 2021-05-29 LAB — CBC
HCT: 32 % — ABNORMAL LOW (ref 36.0–46.0)
Hemoglobin: 10.4 g/dL — ABNORMAL LOW (ref 12.0–15.0)
MCH: 28.8 pg (ref 26.0–34.0)
MCHC: 32.5 g/dL (ref 30.0–36.0)
MCV: 88.6 fL (ref 80.0–100.0)
Platelets: 318 10*3/uL (ref 150–400)
RBC: 3.61 MIL/uL — ABNORMAL LOW (ref 3.87–5.11)
RDW: 13.3 % (ref 11.5–15.5)
WBC: 10.7 10*3/uL — ABNORMAL HIGH (ref 4.0–10.5)
nRBC: 0 % (ref 0.0–0.2)

## 2021-05-29 LAB — COMPREHENSIVE METABOLIC PANEL
ALT: 20 U/L (ref 0–44)
AST: 23 U/L (ref 15–41)
Albumin: 2.7 g/dL — ABNORMAL LOW (ref 3.5–5.0)
Alkaline Phosphatase: 69 U/L (ref 38–126)
Anion gap: 6 (ref 5–15)
BUN: 21 mg/dL — ABNORMAL HIGH (ref 6–20)
CO2: 24 mmol/L (ref 22–32)
Calcium: 8.7 mg/dL — ABNORMAL LOW (ref 8.9–10.3)
Chloride: 106 mmol/L (ref 98–111)
Creatinine, Ser: 0.96 mg/dL (ref 0.44–1.00)
GFR, Estimated: >60 mL/min (ref >60)
Glucose, Bld: 150 mg/dL — ABNORMAL HIGH (ref 70–99)
Potassium: 4.1 mmol/L (ref 3.5–5.1)
Sodium: 136 mmol/L (ref 135–145)
Total Bilirubin: 1.1 mg/dL (ref 0.3–1.2)
Total Protein: 5.6 g/dL — ABNORMAL LOW (ref 6.5–8.1)

## 2021-05-29 LAB — GLUCOSE, CAPILLARY
Glucose-Capillary: 154 mg/dL — ABNORMAL HIGH (ref 70–99)
Glucose-Capillary: 126 mg/dL — ABNORMAL HIGH (ref 70–99)
Glucose-Capillary: 184 mg/dL — ABNORMAL HIGH (ref 70–99)
Glucose-Capillary: 206 mg/dL — ABNORMAL HIGH (ref 70–99)

## 2021-05-29 LAB — MAGNESIUM: Magnesium: 1.9 mg/dL (ref 1.7–2.4)

## 2021-05-29 MED ORDER — LISINOPRIL 10 MG PO TABS
10.0000 mg | ORAL_TABLET | Freq: Every day | ORAL | Status: DC
  Administered 2021-05-29 – 2021-05-30 (×2): 10 mg via ORAL
  Filled 2021-05-29 (×2): qty 1

## 2021-05-29 MED ORDER — ATORVASTATIN CALCIUM 20 MG PO TABS
20.0000 mg | ORAL_TABLET | Freq: Every day | ORAL | Status: DC
  Administered 2021-05-29 – 2021-05-30 (×2): 20 mg via ORAL
  Filled 2021-05-29 (×2): qty 1

## 2021-05-29 MED ORDER — INSULIN GLARGINE-YFGN 100 UNIT/ML ~~LOC~~ SOLN
9.0000 [IU] | Freq: Every day | SUBCUTANEOUS | Status: DC
  Administered 2021-05-29 – 2021-05-30 (×2): 9 [IU] via SUBCUTANEOUS
  Filled 2021-05-29 (×2): qty 0.09

## 2021-05-29 MED ORDER — INSULIN ASPART 100 UNIT/ML IJ SOLN
3.0000 [IU] | Freq: Three times a day (TID) | INTRAMUSCULAR | Status: DC
  Administered 2021-05-29 – 2021-05-30 (×2): 3 [IU] via SUBCUTANEOUS

## 2021-05-29 MED ORDER — INSULIN ASPART 100 UNIT/ML IJ SOLN
0.0000 [IU] | Freq: Three times a day (TID) | INTRAMUSCULAR | Status: DC
  Administered 2021-05-29: 5 [IU] via SUBCUTANEOUS
  Administered 2021-05-29: 3 [IU] via SUBCUTANEOUS
  Administered 2021-05-30: 8 [IU] via SUBCUTANEOUS

## 2021-05-29 MED ORDER — GABAPENTIN 300 MG PO CAPS
300.0000 mg | ORAL_CAPSULE | Freq: Three times a day (TID) | ORAL | Status: DC
  Administered 2021-05-29 – 2021-05-30 (×4): 300 mg via ORAL
  Filled 2021-05-29 (×5): qty 1

## 2021-05-29 MED ORDER — HYDROMORPHONE HCL 1 MG/ML IJ SOLN
1.0000 mg | INTRAMUSCULAR | Status: DC | PRN
  Administered 2021-05-29 – 2021-05-30 (×4): 1 mg via INTRAVENOUS
  Filled 2021-05-29 (×4): qty 1

## 2021-05-29 MED ORDER — DICYCLOMINE HCL 20 MG PO TABS
20.0000 mg | ORAL_TABLET | Freq: Three times a day (TID) | ORAL | Status: DC | PRN
  Filled 2021-05-29: qty 1

## 2021-05-29 MED ORDER — INSULIN ASPART 100 UNIT/ML IJ SOLN: 3.0000 [IU] | Freq: Three times a day (TID) | INTRAMUSCULAR | Status: DC

## 2021-05-29 MED ORDER — PANTOPRAZOLE SODIUM 40 MG PO TBEC
40.0000 mg | DELAYED_RELEASE_TABLET | Freq: Two times a day (BID) | ORAL | Status: DC
  Administered 2021-05-29 – 2021-05-30 (×3): 40 mg via ORAL
  Filled 2021-05-29 (×3): qty 1

## 2021-05-29 NOTE — Progress Notes (Signed)
Assumed care from previous RN. Agree with previous assessment. Pt resting in bed.  ?

## 2021-05-29 NOTE — TOC Initial Note (Signed)
Transition of Care (TOC) - Initial/Assessment Note  ? ? ?Patient Details  ?Name: Kelli Hudson ?MRN: [id number] ?Date of Birth: 1990/07/29 ? ?Transition of [facility]) CM/SW Contact:    ?Lanier Clam, RN ?Phone Number: ?05/29/2021, 9:40 AM ? ?Clinical Narrative:d/c plan home.                 ? ? ?Expected Discharge Plan: Home/Self Care ?Barriers to Discharge: Continued Medical Work up ? ? ?Patient Goals and CMS Choice ?Patient states their goals for this hospitalization and ongoing recovery are:: Home ?CMS Medicare.gov Compare Post Acute Care list provided to:: Patient ?Choice offered to / list presented to : Parent ? ?Expected Discharge Plan and Services ?Expected Discharge Plan: Home/Self Care ?  ?Discharge Planning Services: CM Consult ?  ?Living arrangements for the past 2 months: Single Family Home ?                ?  ?  ?  ?  ?  ?  ?  ?  ?  ?  ? ?Prior Living Arrangements/Services ?Living arrangements for the past 2 months: Single Family Home ?Lives with:: Parents ?Patient language and need for interpreter reviewed:: Yes ?Do you feel safe going back to the place where you live?: Yes      ?Need for Family Participation in Patient Care: Yes (Comment) ?Care giver support system in place?: Yes (comment) ?  ?Criminal Activity/Legal Involvement Pertinent to Current Situation/Hospitalization: No - Comment as needed ? ?Activities of Daily Living ?Home Assistive Devices/Equipment: Eyeglasses, CBG Meter, Insulin Pump ?ADL Screening (condition at time of admission) ?Patient's cognitive ability adequate to safely complete daily activities?: Yes ?Is the patient deaf or have difficulty hearing?: No ?Does the patient have difficulty seeing, even when wearing glasses/contacts?: No ?Does the patient have difficulty concentrating, remembering, or making decisions?: No ?Patient able to express need for assistance with ADLs?: Yes ?Does the patient have difficulty dressing or bathing?: No ?Independently performs ADLs?: Yes  (appropriate for developmental age) ?Does the patient have difficulty walking or climbing stairs?: No ?Weakness of Legs: None ?Weakness of Arms/Hands: None ? ?Permission Sought/Granted ?Permission sought to share information with : Case Manager ?Permission granted to share information with : Yes, Verbal Permission Granted ? Share Information with NAME: Case Manager ?   ?   ?   ? ?Emotional Assessment ?Appearance:: Appears stated age ?Attitude/Demeanor/Rapport: Gracious ?Affect (typically observed): Accepting ?Orientation: : Oriented to Self, Oriented to Place, Oriented to  Time, Oriented to Situation ?Alcohol / Substance Use: Alcohol Use ?Psych Involvement: No (comment) ? ?Admission diagnosis:  Gastroparesis [K31.84] ?Intractable vomiting [R11.10] ?Intractable nausea and vomiting [R11.2] ?Nausea and vomiting, unspecified vomiting type [R11.2] ?Patient Active Problem List  ? Diagnosis Date Noted  ? Hypomagnesemia 04/21/2021  ? Intractable nausea and vomiting 04/20/2021  ? Ulcerated, foot, left, with fat layer exposed (HCC) 04/20/2021  ? 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 DKA (diabetic ketoacidosis) (HCC) 02/24/2021  ? Type 1 diabetes mellitus with diabetic polyneuropathy (HCC) 12/18/2020  ? DM (diabetes mellitus), type 1 (HCC) 12/18/2020  ? 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 Diabetic retinopathy of both eyes associated with type 1 diabetes mellitus (HCC) 09/24/2020  ? Diabetic gastroparesis associated with type 1 diabetes mellitus (HCC) 08/12/2020  ? AKI (acute kidney injury) (HCC) 07/25/2020  ? GERD (gastroesophageal reflux disease) 07/23/2020  ? Diabetic nephropathy associated with type 1 diabetes mellitus (HCC) 06/27/2020  ?  Hyperlipidemia 05/23/2020  ? Normocytic anemia 04/20/2020  ? Hyperosmolar hyperglycemic state (HHS) (HCC) 02/24/2020  ? Intractable abdominal pain 01/24/2020  ? H. pylori infection 10/07/2019  ?  Depression with anxiety 07/05/2019  ? Diabetic polyneuropathy associated with type 1 diabetes mellitus (HCC) 09/24/2017  ? Goiter 10/05/2009  ? Hypertension associated with diabetes (HCC) 10/05/2009  ? ?PCP:  Loyola Mast, MD ?Pharmacy:   ?[id number] - [place], [place] - [address] ?[address] ?[place] [address] ?Phone: [phone] Fax: [phone] ? ?[facility] - [place], [place] - [address] ?[address] ?[place] [address] ?Phone: [phone] Fax: [phone] ? ? ? ? ?Social Determinants of Health (SDOH) Interventions ?Food Insecurity Interventions: Intervention Not Indicated ?Financial Strain Interventions: Intervention Not Indicated ?Housing Interventions: Intervention Not Indicated ?Social Connections Interventions: Intervention Not Indicated ?Transportation Interventions: Intervention Not Indicated ? ?Readmission Risk Interventions ? ?  05/29/2021  ?  9:33 AM 02/27/2021  ?  9:17 AM  ?Readmission Risk Prevention Plan  ?Transportation Screening Complete Complete  ?Medication Review Oceanographer)  Complete  ?PCP or Specialist appointment within 3-5 days of discharge Complete Complete  ?HRI or Home Care Consult Complete Complete  ?SW Recovery Care/Counseling Consult Complete Complete  ?Palliative Care Screening Not Applicable Not Applicable  ?Skilled Nursing Facility Not Applicable Not Applicable  ? ? ? ?

## 2021-05-29 NOTE — Progress Notes (Signed)
Pt arrived to the unit in NAD. Pt c/o 9/10 abd and chest pain. Pt's bed in lowest position with call bell within reach. Vitals stable, pain medication given to pt. Will continue to monitor.  ?

## 2021-05-29 NOTE — Progress Notes (Signed)
?PROGRESS NOTE ? ? ? Kelli Hudson  ZOX:[id number] DOB: February 23, 1990 DOA: 05/27/2021 ?PCP: Loyola Mast, MD  ? ?  ?Brief Narrative:  ?Kelli Hudson is a 31 y.o. female with medical history significant of DM type 1, gastroparesis, HTN. Presenting w/ N/V. Symptoms starting yesterday. Tried her normal nausea medication, but they didn't give complete relief. No new exotic foods. No sick contacts. No fevers. When her symptoms didn't resolve last night, she decided to come to the ED. She denies any other aggravating or alleviating factors.   ? ?New events last 24 hours / Subjective: ?She has been tolerating clear liquid diet, no nausea or vomiting.  Still having some abdominal pain, states that fentanyl has not been helping.  Wants to try to advance her diet today. ? ?Assessment & Plan: ?  ?Active Problems: ?  Diabetic gastroparesis associated with type 1 diabetes mellitus (HCC) ?  Intractable nausea and vomiting ?  Hypertension associated with diabetes (HCC) ?  Hyperlipidemia ?  AKI (acute kidney injury) (HCC) ?  DM (diabetes mellitus), type 1 (HCC) ?  Hypomagnesemia ? ? ?Intractable nausea and vomiting secondary to gastroparesis ?-Slowly improving ?-Reglan ?-Advance diet today ? ?AKI ?-Renal ultrasound unremarkable ?-Resolved ? ?Diabetes mellitus type 1, uncontrolled with hyperglycemia ?-Hemoglobin A1c 9.7 ?-Continue Semglee, NovoLog, sliding scale insulin ? ?Hypertension ?-Lisinopril ? ?Hyperlipidemia ?-Lipitor ? ?DVT prophylaxis:  ?enoxaparin (LOVENOX) injection 40 mg Start: 05/28/21 1600 ?SCDs Start: 05/28/21 0341 ? ?Code Status: Full code ?Family Communication: No family at bedside ?Disposition Plan:  ?Status is: Inpatient ?Remains inpatient appropriate because: Slowly advance diet, possible discharge home 4/13 ? ?Antimicrobials:  ?Anti-infectives (From admission, onward)  ? ? None  ? ?  ? ? ? ?Objective: ?Vitals:  ? 05/29/21 0252 05/29/21 0358 05/29/21 0723 05/29/21 1117  ?BP: 138/89  121/85 (!)  133/92  ?Pulse: 84  98 95  ?Resp: 18  12 14   ?Temp: 98.6 ?F (37 ?C)  98.3 ?F (36.8 ?C) 98.3 ?F (36.8 ?C)  ?TempSrc: Oral  Oral Oral  ?SpO2: 98%  99% 98%  ?Weight:  86.5 kg    ?Height:      ? ? ?Intake/Output Summary (Last 24 hours) at 05/29/2021 1425 ?Last data filed at 05/29/2021 0400 ?Gross per 24 hour  ?Intake 2867.12 ml  ?Output --  ?Net 2867.12 ml  ? ?Filed Weights  ? 05/27/21 2206 05/29/21 0358  ?Weight: 81.6 kg 86.5 kg  ? ? ?Examination:  ?General exam: Appears calm and comfortable  ?Respiratory system: Clear to auscultation. Respiratory effort normal. No respiratory distress. No conversational dyspnea.  ?Cardiovascular system: S1 & S2 heard, RRR. No murmurs. No pedal edema. ?Gastrointestinal system: Abdomen is nondistended, soft and nontender. Normal bowel sounds heard. ?Central nervous system: Alert and oriented. No focal neurological deficits. Speech clear.  ?Extremities: Symmetric in appearance  ?Skin: No rashes, lesions or ulcers on exposed skin  ?Psychiatry: Judgement and insight appear normal. Mood & affect appropriate.  ? ?Data Reviewed: I have personally reviewed following labs and imaging studies ? ?CBC: ?Recent Labs  ?Lab 05/27/21 ?2320 05/28/21 ?0442 05/28/21 ?1111 05/29/21 ?0453  ?WBC 12.8* 11.6* 13.2* 10.7*  ?NEUTROABS 9.6* 10.2*  --   --   ?HGB 14.0 11.2* 10.4* 10.4*  ?HCT 41.3 34.1* 31.1* 32.0*  ?MCV 85.2 87.9 86.4 88.6  ?PLT 375 338 332 318  ? ?Basic Metabolic Panel: ?Recent Labs  ?Lab 05/27/21 ?2320 05/28/21 ?0442 05/29/21 ?0453  ?NA 137 137 136  ?K 3.5 4.5 4.1  ?CL 102 103 106  ?CO2  24 25 24   ?GLUCOSE 284* 375* 150*  ?BUN 22* 22* 21*  ?CREATININE 1.44* 1.49* 0.96  ?CALCIUM 9.6 8.8* 8.7*  ?MG 1.7 1.6* 1.9  ? ?GFR: ?Estimated Creatinine Clearance: 99.5 mL/min (by C-G formula based on SCr of 0.96 mg/dL). ?Liver Function Tests: ?Recent Labs  ?Lab 05/27/21 ?2320 05/28/21 ?0442 05/29/21 ?0453  ?AST 24 19 23   ?ALT 27 23 20   ?ALKPHOS 101 83 69  ?BILITOT 0.7 1.1 1.1  ?PROT 7.6 6.4* 5.6*  ?ALBUMIN 3.6  2.8* 2.7*  ? ?Recent Labs  ?Lab 05/27/21 ?2320  ?LIPASE 27  ? ?No results for input(s): AMMONIA in the last 168 hours. ?Coagulation Profile: ?No results for input(s): INR, PROTIME in the last 168 hours. ?Cardiac Enzymes: ?No results for input(s): CKTOTAL, CKMB, CKMBINDEX, TROPONINI in the last 168 hours. ?BNP (last 3 results) ?No results for input(s): PROBNP in the last 8760 hours. ?HbA1C: ?Recent Labs  ?  05/28/21 ?1112  ?HGBA1C 9.7*  ? ?CBG: ?Recent Labs  ?Lab 05/28/21 ?2003 05/28/21 ?2351 05/29/21 ?0357 05/29/21 ?0719 05/29/21 ?1114  ?GLUCAP 151* 145* 154* 126* 184*  ? ?Lipid Profile: ?No results for input(s): CHOL, HDL, LDLCALC, TRIG, CHOLHDL, LDLDIRECT in the last 72 hours. ?Thyroid Function Tests: ?No results for input(s): TSH, T4TOTAL, FREET4, T3FREE, THYROIDAB in the last 72 hours. ?Anemia Panel: ?No results for input(s): VITAMINB12, FOLATE, FERRITIN, TIBC, IRON, RETICCTPCT in the last 72 hours. ?Sepsis Labs: ?No results for input(s): PROCALCITON, LATICACIDVEN in the last 168 hours. ? ?No results found for this or any previous visit (from the past 240 hour(s)).  ? ? ?Radiology Studies: ?07/29/21 RENAL ? ?Result Date: 05/28/2021 ?CLINICAL DATA:  Acute kidney injury EXAM: RENAL / URINARY TRACT ULTRASOUND COMPLETE COMPARISON:  None. FINDINGS: Right Kidney: Renal measurements: 10.2 x 4.6 x 6.3 cm = volume: 151 mL. Echogenicity within normal limits. No mass or hydronephrosis visualized. Left Kidney: Renal measurements: 11.8 x 5.6 x 5.4 cm = volume: 185 mL. Echogenicity within normal limits. No mass or hydronephrosis visualized. Bladder: Appears normal for degree of bladder distention. Other: None. IMPRESSION: No significant sonographic abnormality of the kidneys. Electronically Signed   By: 07/29/21 M.D.   On: 05/28/2021 11:44   ? ? ? ?Scheduled Meds: ? atorvastatin  20 mg Oral Daily  ? enoxaparin (LOVENOX) injection  40 mg Subcutaneous Q24H  ? gabapentin  300 mg Oral TID  ? insulin aspart  0-15 Units Subcutaneous  TID WC  ? insulin aspart  3 Units Subcutaneous TID WC  ? insulin glargine-yfgn  9 Units Subcutaneous Daily  ? lisinopril  10 mg Oral Daily  ? metoCLOPramide (REGLAN) injection  10 mg Intravenous Q8H  ? pantoprazole  40 mg Oral BID AC  ? ?Continuous Infusions: ? ? LOS: 1 day  ? ? ? ?07/28/2021, DO ?[facility] ?05/29/2021, 2:25 PM  ? ?Available via Epic secure chat 7am-7pm ?After these hours, please refer to coverage provider listed on [email or web address] ? ?

## 2021-05-30 DIAGNOSIS — E1043 Type 1 diabetes mellitus with diabetic autonomic (poly)neuropathy: Secondary | ICD-10-CM | POA: Diagnosis not present

## 2021-05-30 DIAGNOSIS — K3184 Gastroparesis: Secondary | ICD-10-CM | POA: Diagnosis not present

## 2021-05-30 LAB — GLUCOSE, CAPILLARY
Glucose-Capillary: 261 mg/dL — ABNORMAL HIGH (ref 70–99)
Glucose-Capillary: 267 mg/dL — ABNORMAL HIGH (ref 70–99)

## 2021-05-30 MED ORDER — METOCLOPRAMIDE HCL 10 MG PO TABS: 10.0000 mg | ORAL_TABLET | Freq: Three times a day (TID) | ORAL | 0 refills | Status: DC

## 2021-05-30 NOTE — TOC Transition Note (Signed)
Transition of Care (TOC) - CM/SW Discharge Note ? ? ?Patient Details  ?Name: Kelli Hudson ?MRN: [id number] ?Date of Birth: 1990-08-13 ? ?Transition of Care [facility]) CM/SW Contact:  ?Lanier Clam, RN ?Phone Number: ?05/30/2021, 9:06 AM ? ? ?Clinical Narrative: d/c home.   ? ? ? ?Final next level of care: Home/Self Care ?Barriers to Discharge: No Barriers Identified ? ? ?Patient Goals and CMS Choice ?Patient states their goals for this hospitalization and ongoing recovery are:: Home ?CMS Medicare.gov Compare Post Acute Care list provided to:: Patient ?Choice offered to / list presented to : Parent ? ?Discharge Placement ?  ?           ?  ?  ?  ?  ? ?Discharge Plan and Services ?  ?Discharge Planning Services: CM Consult ?           ?  ?  ?  ?  ?  ?  ?  ?  ?  ?  ? ?Social Determinants of Health (SDOH) Interventions ?Food Insecurity Interventions: Intervention Not Indicated ?Financial Strain Interventions: Intervention Not Indicated ?Housing Interventions: Intervention Not Indicated ?Social Connections Interventions: Intervention Not Indicated ?Transportation Interventions: Intervention Not Indicated ? ? ?Readmission Risk Interventions ? ?  05/29/2021  ?  9:33 AM 02/27/2021  ?  9:17 AM  ?Readmission Risk Prevention Plan  ?Transportation Screening Complete Complete  ?Medication Review [facility])  Complete  ?PCP or Specialist appointment within 3-5 days of discharge Complete Complete  ?HRI or Home Care Consult Complete Complete  ?SW Recovery Care/Counseling Consult Complete Complete  ?Palliative Care Screening Not Applicable Not Applicable  ?Skilled Nursing Facility Not Applicable Not Applicable  ? ? ? ? ? ?

## 2021-05-30 NOTE — Discharge Summary (Signed)
Physician Discharge Summary  ?Kelli Hudson MMN:[id number] DOB: 03-Aug-1990 DOA: 05/27/2021 ? ?PCP: [name], MD ? ?Admit date: 05/27/2021 ?Discharge date: 05/30/2021 ? ?Admitted From: home ?Disposition:  home ? ?Recommendations for Outpatient Follow-up:  ?Follow up with PCP, GI and endocrinology as scheduled.  ? ?Discharge Condition: Stable, improved ?CODE STATUS: Full  ?Diet recommendation: Carb modified  ? ?Brief/Interim Summary: ?Kelli Hudson is a 31 y.o. female with medical history significant of DM type 1, gastroparesis, HTN. Presenting w/ N/V. Symptoms starting yesterday. Tried her normal nausea medication, but they didn't give complete relief. No new exotic foods. No sick contacts. No fevers. When her symptoms didn't resolve last night, she decided to come to the ED. She denies any other aggravating or alleviating factors. She was started on IV reglan and clear liquid diet. Diet was slowly advanced and patient able to tolerate soft food without vomiting. AKI resolved with IVF.  ? ?Discharge Diagnoses:  ? ?Principal Problem: ?  Diabetic gastroparesis associated with type 1 diabetes mellitus ([place]) ?Active Problems: ?  Intractable nausea and vomiting ?  Hypertension associated with diabetes ([place]) ?  Hyperlipidemia ?  AKI (acute kidney injury) ([place]) ?  Uncontrolled type 1 diabetes mellitus with hyperglycemia, with long-term current use of insulin ([place]) ?  DM (diabetes mellitus), type 1 ([place]) ?  Hypomagnesemia ? ? ?Discharge Instructions ? ?Discharge Instructions   ? ? Call MD for:  difficulty breathing, headache or visual disturbances   Complete by: As directed ?  ? Call MD for:  extreme fatigue   Complete by: As directed ?  ? Call MD for:  persistant dizziness or light-headedness   Complete by: As directed ?  ? Call MD for:  persistant nausea and vomiting   Complete by: As directed ?  ? Call MD for:  severe uncontrolled pain   Complete by: As directed ?  ? Call MD for:  temperature >100.4    Complete by: As directed ?  ? Diet Carb Modified   Complete by: As directed ?  ? Discharge instructions   Complete by: As directed ?  ? You were cared for by a hospitalist during your hospital stay. If you have any questions about your discharge medications or the care you received while you were in the hospital after you are discharged, you can call the unit and ask to speak with the hospitalist on call if the hospitalist that took care of you is not available. Once you are discharged, your primary care physician will handle any further medical issues. Please note that NO REFILLS for any discharge medications will be authorized once you are discharged, as it is imperative that you return to your primary care physician (or establish a relationship with a primary care physician if you do not have one) for your aftercare needs so that they can reassess your need for medications and monitor your lab values.  ? Increase activity slowly   Complete by: As directed ?  ? ?  ? ?Allergies as of 05/30/2021   ?No Known Allergies ?  ? ?  ?Medication List  ?  ? ?STOP taking these medications   ? ?amoxicillin-clavulanate 875-125 MG tablet ?Commonly known as: AUGMENTIN ?  ?traMADol 50 MG tablet ?Commonly known as: Ultram ?  ? ?  ? ?TAKE these medications   ? ?albuterol 108 (90 Base) MCG/ACT inhaler ?Commonly known as: VENTOLIN HFA ?Inhale 2 puffs into the lungs every 6 (six) hours as needed for shortness of breath. ?  ?alum &  mag hydroxide-simeth 200-200-20 MG/5ML suspension ?Commonly known as: MAALOX/MYLANTA ?Take 30 mLs by mouth every 6 (six) hours as needed for indigestion or heartburn. ?  ?atorvastatin 20 MG tablet ?Commonly known as: LIPITOR ?Take 1 tablet (20 mg total) by mouth daily. ?  ?Dexcom G6 Receiver Kerrin Mo ?USE AS DIRECTED ?What changed:  ?how much to take ?how to take this ?when to take this ?  ?Dexcom G6 Sensor Misc ?1 each by Does not apply route daily. ?  ?Dexcom G6 Transmitter Misc ?1 each by Does not apply route  daily. ?  ?dicyclomine 20 MG tablet ?Commonly known as: BENTYL ?Take 1 tablet (20 mg total) by mouth 3 (three) times daily as needed for spasms. ?  ?gabapentin 300 MG capsule ?Commonly known as: NEURONTIN ?Take 1 capsule (300 mg total) by mouth 3 (three) times daily. ?  ?Insulin Pen Needle 31G X 5 MM Misc ?1 Device by Does not apply route QID. For use with insulin pens ?  ?insulin regular 100 units/mL injection ?Commonly known as: NovoLIN R ?Inject 0-0.15 mLs (0-15 Units total) into the skin See admin instructions. Via pump ?  ?lisinopril 10 MG tablet ?Commonly known as: ZESTRIL ?Take 1 tablet (10 mg total) by mouth daily. ?  ?metoCLOPramide 10 MG tablet ?Commonly known as: Reglan ?Take 1 tablet (10 mg total) by mouth 4 (four) times daily -  before meals and at bedtime. ?  ?pantoprazole 40 MG tablet ?Commonly known as: PROTONIX ?Take 1 tablet (40 mg total) by mouth 2 (two) times daily before a meal. ?  ?promethazine 12.5 MG tablet ?Commonly known as: PHENERGAN ?Take 1 tablet (12.5 mg total) by mouth every 6 (six) hours as needed for nausea or vomiting. ?  ?True Metrix Blood Glucose Test test strip ?Generic drug: glucose blood ?Use as instructed ?  ?True Metrix Meter w/Device Kit ?1 Device by Does not apply route 4 (four) times daily. ?  ?TRUEplus Lancets 28G Misc ?assist with checking blood sugar TID and qhs ?What changed:  ?how much to take ?how to take this ?when to take this ?  ? ?  ? ? Follow-up Information   ? ? Yetta Flock, MD Follow up.   ?Specialty: Gastroenterology ?Contact information: ?[address] ?[address] ?[place] [address] ?[phone] ? ? ?  ?  ? ? Shamleffer, Melanie Crazier, MD Follow up.   ?Specialties: Endocrinology, Radiology ?Contact information: ?[address] ?[address] ?[place] [address] ?[phone] ? ? ?  ?  ? ? [name], MD Follow up.   ?Specialty: Family Medicine ?Contact information: ?[address] ?[place] [address] ?[phone] ? ? ?  ?  ? ?   ?  ? ?  ? ?No Known Allergies ? ? ? ?Procedures/Studies: ?US RENAL ? ?Result Date: 05/28/2021 ?CLINICAL DATA:  Acute kidney injury EXAM: RENAL / URINARY TRACT ULTRASOUND COMPLETE COMPARISON:  None. FINDINGS: Right Kidney: Renal measurements: 10.2 x 4.6 x 6.3 cm = volume: 151 mL. Echogenicity within normal limits. No mass or hydronephrosis visualized. Left Kidney: Renal measurements: 11.8 x 5.6 x 5.4 cm = volume: 185 mL. Echogenicity within normal limits. No mass or hydronephrosis visualized. Bladder: Appears normal for degree of bladder distention. Other: None. IMPRESSION: No significant sonographic abnormality of the kidneys. Electronically Signed   By: Miachel Roux M.D.   On: 05/28/2021 11:44  ? ?DG Foot Complete Left ? ?Result Date: 05/17/2021 ?Please see detailed radiograph report in office note. ? ?US THYROID ? ?Result Date:  05/15/2021 ?CLINICAL DATA:  Multinodular goiter follow-up EXAM: THYROID ULTRASOUND TECHNIQUE: Ultrasound examination of the thyroid gland and adjacent soft tissues was performed. COMPARISON:  03/15/2020 FINDINGS: Parenchymal Echotexture: Mildly heterogenous Isthmus: 0.3 cm Right lobe: 6.4 x 1.6 x 2.4 cm Left lobe: 6.0 x 2.1 x 2.1 cm _________________________________________________________ Estimated total number of nodules >/= 1 cm: 2 Number of spongiform nodules >/=  2 cm not described below (TR1): 0 Number of mixed cystic and solid nodules >/= 1.5 cm not described below ([place]): 0 _________________________________________________________ Nodule # 1: Prior biopsy: No Location: Right; mid Maximum size: 1.3 cm; Other 2 dimensions: 0.9 x 0.7 cm, previously, 1.3 x 0.9 x 0.8 cm Composition: solid/almost completely solid (2) Echogenicity: isoechoic (1) Shape: taller-than-wide (3) Margins: ill-defined (0) Echogenic foci: none (0) ACR TI-RADS total points: 6. ACR TI-RADS risk category:  TR4 (4-6 points). Significant change in size (>/= 20% in two dimensions and minimal increase of 2 mm): No Change in  features: No Change in ACR TI-RADS risk category: No ACR TI-RADS recommendations: *Given size (>/= 1 - 1.4 cm) and appearance, a follow-up ultrasound in 1 year should be considered based on TI-RADS criteri

## 2021-05-30 NOTE — Progress Notes (Signed)
Inpatient Diabetes Program Recommendations ? ?AACE/ADA: New Consensus Statement on Inpatient Glycemic Control (2015) ? ?Target Ranges:  Prepandial:   less than 140 mg/dL ?     Peak postprandial:   less than 180 mg/dL (1-2 hours) ?     Critically ill patients:  140 - 180 mg/dL  ? ?Lab Results  ?Component Value Date  ? GLUCAP 267 (H) 05/30/2021  ? HGBA1C 9.7 (H) 05/28/2021  ? ? ?Review of Glycemic Control ? ?Diabetes history: DM1 ?Outpatient Diabetes medications: insulin pump ?Current orders for Inpatient glycemic control: Semglee 9 QD, Novolog 0-15 units TID + 3 units TID ? ?CBGs this am 261, 267 mg/dL ? ?Inpatient Diabetes Program Recommendations:   ? ?Spoke with pt at bedside on 4/12. States she is waiting for her insulin pump supplies. ?Has basal/bolus insulin until pump supplies are in.  ?Endo is Shamleffer. To f/u after hospital d/c. ? ?Thank you. ?Ailene Ards, RD, LDN, CDE ?Inpatient Diabetes Coordinator ?[phone]  ? ? ? ? ?

## 2021-06-03 ENCOUNTER — Encounter (HOSPITAL_BASED_OUTPATIENT_CLINIC_OR_DEPARTMENT_OTHER): Payer: 59 | Admitting: Internal Medicine

## 2021-06-03 ENCOUNTER — Emergency Department (HOSPITAL_COMMUNITY)
Admission: EM | Admit: 2021-06-03 | Discharge: 2021-06-03 | Discharge status: Against Medical Advice | Payer: 59 | Source: Home / Self Care

## 2021-06-04 ENCOUNTER — Encounter (HOSPITAL_COMMUNITY): Payer: Self-pay

## 2021-06-04 ENCOUNTER — Other Ambulatory Visit: Payer: Self-pay

## 2021-06-04 ENCOUNTER — Emergency Department (HOSPITAL_COMMUNITY): Payer: 59

## 2021-06-04 ENCOUNTER — Inpatient Hospital Stay (HOSPITAL_COMMUNITY)
Admission: EM | Admit: 2021-06-04 | Discharge: 2021-06-07 | DRG: 074 | Disposition: A | Discharge status: Home / Self Care | Payer: 59 | Attending: Internal Medicine | Admitting: Internal Medicine

## 2021-06-04 DIAGNOSIS — E1159 Type 2 diabetes mellitus with other circulatory complications: Secondary | ICD-10-CM | POA: Diagnosis present

## 2021-06-04 DIAGNOSIS — E1043 Type 1 diabetes mellitus with diabetic autonomic (poly)neuropathy: Secondary | ICD-10-CM | POA: Diagnosis not present

## 2021-06-04 DIAGNOSIS — E1065 Type 1 diabetes mellitus with hyperglycemia: Secondary | ICD-10-CM | POA: Diagnosis present

## 2021-06-04 DIAGNOSIS — E1069 Type 1 diabetes mellitus with other specified complication: Secondary | ICD-10-CM | POA: Diagnosis present

## 2021-06-04 DIAGNOSIS — R112 Nausea with vomiting, unspecified: Secondary | ICD-10-CM | POA: Diagnosis present

## 2021-06-04 DIAGNOSIS — Z89422 Acquired absence of other left toe(s): Secondary | ICD-10-CM

## 2021-06-04 DIAGNOSIS — K219 Gastro-esophageal reflux disease without esophagitis: Secondary | ICD-10-CM | POA: Diagnosis present

## 2021-06-04 DIAGNOSIS — I152 Hypertension secondary to endocrine disorders: Secondary | ICD-10-CM | POA: Diagnosis present

## 2021-06-04 DIAGNOSIS — Z794 Long term (current) use of insulin: Secondary | ICD-10-CM

## 2021-06-04 DIAGNOSIS — Z800 Family history of malignant neoplasm of digestive organs: Secondary | ICD-10-CM

## 2021-06-04 DIAGNOSIS — D638 Anemia in other chronic diseases classified elsewhere: Secondary | ICD-10-CM

## 2021-06-04 DIAGNOSIS — E039 Hypothyroidism, unspecified: Secondary | ICD-10-CM | POA: Diagnosis present

## 2021-06-04 DIAGNOSIS — K3184 Gastroparesis: Secondary | ICD-10-CM | POA: Diagnosis present

## 2021-06-04 DIAGNOSIS — E860 Dehydration: Secondary | ICD-10-CM

## 2021-06-04 DIAGNOSIS — E1042 Type 1 diabetes mellitus with diabetic polyneuropathy: Secondary | ICD-10-CM | POA: Diagnosis present

## 2021-06-04 DIAGNOSIS — Z9641 Presence of insulin pump (external) (internal): Secondary | ICD-10-CM | POA: Diagnosis present

## 2021-06-04 DIAGNOSIS — Z803 Family history of malignant neoplasm of breast: Secondary | ICD-10-CM

## 2021-06-04 DIAGNOSIS — Z79899 Other long term (current) drug therapy: Secondary | ICD-10-CM

## 2021-06-04 DIAGNOSIS — R739 Hyperglycemia, unspecified: Secondary | ICD-10-CM

## 2021-06-04 DIAGNOSIS — Z801 Family history of malignant neoplasm of trachea, bronchus and lung: Secondary | ICD-10-CM

## 2021-06-04 DIAGNOSIS — Z833 Family history of diabetes mellitus: Secondary | ICD-10-CM

## 2021-06-04 DIAGNOSIS — I959 Hypotension, unspecified: Secondary | ICD-10-CM | POA: Diagnosis present

## 2021-06-04 DIAGNOSIS — Z8041 Family history of malignant neoplasm of ovary: Secondary | ICD-10-CM

## 2021-06-04 DIAGNOSIS — I248 Other forms of acute ischemic heart disease: Secondary | ICD-10-CM | POA: Diagnosis present

## 2021-06-04 DIAGNOSIS — D72829 Elevated white blood cell count, unspecified: Secondary | ICD-10-CM

## 2021-06-04 LAB — COMPREHENSIVE METABOLIC PANEL
ALT: 24 U/L (ref 0–44)
AST: 25 U/L (ref 15–41)
Albumin: 3.4 g/dL — ABNORMAL LOW (ref 3.5–5.0)
Alkaline Phosphatase: 81 U/L (ref 38–126)
Anion gap: 10 (ref 5–15)
BUN: 21 mg/dL — ABNORMAL HIGH (ref 6–20)
CO2: 25 mmol/L (ref 22–32)
Calcium: 9.4 mg/dL (ref 8.9–10.3)
Chloride: 105 mmol/L (ref 98–111)
Creatinine, Ser: 1.19 mg/dL — ABNORMAL HIGH (ref 0.44–1.00)
GFR, Estimated: >60 mL/min (ref >60)
Glucose, Bld: 339 mg/dL — ABNORMAL HIGH (ref 70–99)
Potassium: 4 mmol/L (ref 3.5–5.1)
Sodium: 140 mmol/L (ref 135–145)
Total Bilirubin: 0.7 mg/dL (ref 0.3–1.2)
Total Protein: 7.5 g/dL (ref 6.5–8.1)

## 2021-06-04 LAB — LIPASE, BLOOD: Lipase: 25 U/L (ref 11–51)

## 2021-06-04 LAB — I-STAT BETA HCG BLOOD, ED (MC, WL, AP ONLY): I-stat hCG, quantitative: 8.5 m[IU]/mL — ABNORMAL HIGH (ref <5)

## 2021-06-04 LAB — TROPONIN I (HIGH SENSITIVITY)
Troponin I (High Sensitivity): 18 ng/L — ABNORMAL HIGH (ref <18)
Troponin I (High Sensitivity): 7 ng/L (ref <18)
Troponin I (High Sensitivity): 39 ng/L — ABNORMAL HIGH (ref <18)

## 2021-06-04 LAB — URINALYSIS, ROUTINE W REFLEX MICROSCOPIC
Bilirubin Urine: NEGATIVE
Glucose, UA: >=500 mg/dL — AB
Ketones, ur: 20 mg/dL — AB
Leukocytes,Ua: NEGATIVE
Nitrite: NEGATIVE
Protein, ur: >=300 mg/dL — AB
Specific Gravity, Urine: 1.035 — ABNORMAL HIGH (ref 1.005–1.030)
pH: 5 (ref 5.0–8.0)

## 2021-06-04 LAB — CBC
HCT: 36.3 % (ref 36.0–46.0)
Hemoglobin: 12.1 g/dL (ref 12.0–15.0)
MCH: 29.3 pg (ref 26.0–34.0)
MCHC: 33.3 g/dL (ref 30.0–36.0)
MCV: 87.9 fL (ref 80.0–100.0)
Platelets: 423 10*3/uL — ABNORMAL HIGH (ref 150–400)
RBC: 4.13 MIL/uL (ref 3.87–5.11)
RDW: 13.6 % (ref 11.5–15.5)
WBC: 16.2 10*3/uL — ABNORMAL HIGH (ref 4.0–10.5)
nRBC: 0 % (ref 0.0–0.2)

## 2021-06-04 LAB — GLUCOSE, CAPILLARY: Glucose-Capillary: 360 mg/dL — ABNORMAL HIGH (ref 70–99)

## 2021-06-04 LAB — CBG MONITORING, ED: Glucose-Capillary: 305 mg/dL — ABNORMAL HIGH (ref 70–99)

## 2021-06-04 LAB — HCG, QUANTITATIVE, PREGNANCY: hCG, Beta Chain, Quant, S: <1 m[IU]/mL (ref <5)

## 2021-06-04 MED ORDER — LACTATED RINGERS IV BOLUS
1000.0000 mL | Freq: Once | INTRAVENOUS | Status: AC
  Administered 2021-06-04: 1000 mL via INTRAVENOUS

## 2021-06-04 MED ORDER — HYDROMORPHONE HCL 1 MG/ML IJ SOLN
1.0000 mg | Freq: Once | INTRAMUSCULAR | Status: AC
  Administered 2021-06-04: 1 mg via INTRAVENOUS
  Filled 2021-06-04: qty 1

## 2021-06-04 MED ORDER — INSULIN ASPART 100 UNIT/ML IJ SOLN
0.0000 [IU] | Freq: Three times a day (TID) | INTRAMUSCULAR | Status: DC
  Administered 2021-06-05: 3 [IU] via SUBCUTANEOUS
  Administered 2021-06-05: 5 [IU] via SUBCUTANEOUS
  Administered 2021-06-05: 3 [IU] via SUBCUTANEOUS
  Administered 2021-06-06: 2 [IU] via SUBCUTANEOUS
  Administered 2021-06-06: 5 [IU] via SUBCUTANEOUS
  Administered 2021-06-06 – 2021-06-07 (×2): 3 [IU] via SUBCUTANEOUS
  Filled 2021-06-04: qty 0.09

## 2021-06-04 MED ORDER — INSULIN GLARGINE-YFGN 100 UNIT/ML ~~LOC~~ SOLN
8.0000 [IU] | Freq: Every day | SUBCUTANEOUS | Status: DC
  Administered 2021-06-04: 8 [IU] via SUBCUTANEOUS
  Filled 2021-06-04 (×3): qty 0.08

## 2021-06-04 MED ORDER — ACETAMINOPHEN 325 MG PO TABS
650.0000 mg | ORAL_TABLET | Freq: Four times a day (QID) | ORAL | Status: DC | PRN
  Administered 2021-06-05: 650 mg via ORAL
  Filled 2021-06-04: qty 2

## 2021-06-04 MED ORDER — HYDROMORPHONE HCL 1 MG/ML IJ SOLN
0.5000 mg | INTRAMUSCULAR | Status: DC | PRN
  Administered 2021-06-04 – 2021-06-06 (×8): 1 mg via INTRAVENOUS
  Filled 2021-06-04 (×8): qty 1

## 2021-06-04 MED ORDER — DICYCLOMINE HCL 20 MG PO TABS
20.0000 mg | ORAL_TABLET | Freq: Three times a day (TID) | ORAL | Status: DC | PRN
  Filled 2021-06-04: qty 1

## 2021-06-04 MED ORDER — ONDANSETRON 4 MG PO TBDP
4.0000 mg | ORAL_TABLET | Freq: Once | ORAL | Status: AC | PRN
Start: 2021-06-04 — End: 2021-06-04
  Administered 2021-06-04: 4 mg via ORAL
  Filled 2021-06-04: qty 1

## 2021-06-04 MED ORDER — HYDROMORPHONE HCL 2 MG/ML IJ SOLN: 2.0000 mg | Freq: Once | INTRAMUSCULAR | Status: DC

## 2021-06-04 MED ORDER — METOCLOPRAMIDE HCL 5 MG/ML IJ SOLN
10.0000 mg | Freq: Four times a day (QID) | INTRAMUSCULAR | Status: DC
  Administered 2021-06-04 – 2021-06-07 (×11): 10 mg via INTRAVENOUS
  Filled 2021-06-04 (×12): qty 2

## 2021-06-04 MED ORDER — SODIUM CHLORIDE 0.9 % IV SOLN: INTRAVENOUS | Status: DC

## 2021-06-04 MED ORDER — METOCLOPRAMIDE HCL 5 MG/ML IJ SOLN
10.0000 mg | Freq: Once | INTRAMUSCULAR | Status: AC
  Administered 2021-06-04: 10 mg via INTRAVENOUS
  Filled 2021-06-04: qty 2

## 2021-06-04 MED ORDER — PANTOPRAZOLE SODIUM 40 MG PO TBEC
40.0000 mg | DELAYED_RELEASE_TABLET | Freq: Two times a day (BID) | ORAL | Status: DC
  Administered 2021-06-04 – 2021-06-05 (×2): 40 mg via ORAL
  Filled 2021-06-04 (×2): qty 1

## 2021-06-04 MED ORDER — INSULIN ASPART 100 UNIT/ML IJ SOLN
0.0000 [IU] | Freq: Every day | INTRAMUSCULAR | Status: DC
  Administered 2021-06-04: 5 [IU] via SUBCUTANEOUS
  Administered 2021-06-06: 3 [IU] via SUBCUTANEOUS
  Filled 2021-06-04: qty 0.05

## 2021-06-04 MED ORDER — PROCHLORPERAZINE EDISYLATE 10 MG/2ML IJ SOLN
10.0000 mg | Freq: Four times a day (QID) | INTRAMUSCULAR | Status: DC | PRN
  Administered 2021-06-05 (×2): 10 mg via INTRAVENOUS
  Filled 2021-06-04 (×2): qty 2

## 2021-06-04 MED ORDER — GABAPENTIN 300 MG PO CAPS
300.0000 mg | ORAL_CAPSULE | Freq: Three times a day (TID) | ORAL | Status: DC
  Administered 2021-06-04 – 2021-06-07 (×9): 300 mg via ORAL
  Filled 2021-06-04 (×9): qty 1

## 2021-06-04 MED ORDER — ENOXAPARIN SODIUM 40 MG/0.4ML IJ SOSY
40.0000 mg | PREFILLED_SYRINGE | INTRAMUSCULAR | Status: DC
  Administered 2021-06-04 – 2021-06-06 (×3): 40 mg via SUBCUTANEOUS
  Filled 2021-06-04 (×3): qty 0.4

## 2021-06-04 MED ORDER — ACETAMINOPHEN 650 MG RE SUPP: 650.0000 mg | Freq: Four times a day (QID) | RECTAL | Status: DC | PRN

## 2021-06-04 NOTE — ED Triage Notes (Signed)
Patient c/o abdominal pain, vomiting, and chest pain x 3 days. Patient states she has gastroparesis.  ?

## 2021-06-04 NOTE — H&P (Signed)
?History and Physical  ? ? ?Kelli Hudson  ?CXK:[id number]  ?DOB: 1990/08/11  ?DOA: 06/04/2021 ?PCP: [name], MD  ? ?Patient coming from: Home ? ?Chief Complaint: Vomiting ? ?HPI: Kelli Hudson is a 31 y.o. female with medical history of diabetes mellitus, insulin requiring, diabetic gastroparesis, gastroesophageal reflux disease, hypertension and hypothyroidism who presents to the hospital for abdominal pain and vomiting.  The patient was hospitalized earlier this month for similar complaints and was discharged on 4/13 with prescriptions for oral Reglan and promethazine.  That over the weekend she began to get nauseated again and did not eat much.  By Monday morning, she began to have vomiting again which has persisted through today.  She last vomited about 2 hours ago.  No complaints of hematemesis.  Abdominal pain is mostly upper abdominal pain.  No diarrhea or constipation. ? ?ED Course: Treated with IV antiemetics, IV pain meds and IV fluids. ?Also noted to have mildly elevated troponin and WBC count of 16.2 ?Review of Systems:  ?All other systems reviewed and apart from HPI, are negative. ? ?Past Medical History:  ?Diagnosis Date  ? Acute H. pylori gastric ulcer   ? Diabetes mellitus ([place])   ? Diabetic gastroparesis ([place])   ? Gastroparesis   ? GERD (gastroesophageal reflux disease)   ? Hypertension   ? Hyperthyroidism   ? ? ?Past Surgical History:  ?Procedure Laterality Date  ? AMPUTATION TOE Left 03/10/2018  ? Procedure: AMPUTATION FIFTH TOE;  Surgeon: Trula Slade, DPM;  Location: [place];  Service: Podiatry;  Laterality: Left;  ? BIOPSY  01/28/2020  ? Procedure: BIOPSY;  Surgeon: Otis Brace, MD;  Location: [facility];  Service: Gastroenterology;;  ? ESOPHAGOGASTRODUODENOSCOPY N/A 01/28/2020  ? Procedure: ESOPHAGOGASTRODUODENOSCOPY (EGD);  Surgeon: Otis Brace, MD;  Location: [facility];  Service: Gastroenterology;  Laterality: N/A;  ?  ESOPHAGOGASTRODUODENOSCOPY (EGD) WITH PROPOFOL Left 09/08/2015  ? Procedure: ESOPHAGOGASTRODUODENOSCOPY (EGD) WITH PROPOFOL;  Surgeon: Arta Silence, MD;  Location: [facility] ENDOSCOPY;  Service: Endoscopy;  Laterality: Left;  ? WISDOM TOOTH EXTRACTION    ? ? ?Social History:  ? reports that she has never smoked. She has never used smokeless tobacco. She reports current alcohol use of about 1.0 standard drink per week. She reports that she does not use drugs. ? ?No Known Allergies ? ?Family History  ?Problem Relation Age of Onset  ? Lung cancer Mother   ? Diabetes Mother   ? Bipolar disorder Father   ? Liver cancer Maternal Grandfather   ? Breast cancer Other   ?     maternal great aunt  ? Heart disease Other   ? Ovarian cancer Other   ?     maternal great aunt  ? Pancreatic cancer Maternal Uncle   ? Prostate cancer Maternal Uncle   ? Diabetes Maternal Aunt   ?     x 2  ? Kidney disease Other   ?     maternal great aunt  ? ? ? ?Prior to Admission medications   ?Medication Sig Start Date End Date Taking? Authorizing Provider  ?albuterol (VENTOLIN HFA) 108 (90 Base) MCG/ACT inhaler Inhale 2 puffs into the lungs every 6 (six) hours as needed for shortness of breath.   Yes [provider]  ?atorvastatin (LIPITOR) 20 MG tablet Take 1 tablet (20 mg total) by mouth daily. 04/10/21  Yes [name], MD  ?dicyclomine (BENTYL) 20 MG tablet Take 1 tablet (20 mg total) by mouth 3 (three) times daily  as needed for spasms. 04/24/21  Yes Aline August, MD  ?gabapentin (NEURONTIN) 300 MG capsule Take 1 capsule (300 mg total) by mouth 3 (three) times daily. 04/24/21  Yes Aline August, MD  ?insulin regular (NOVOLIN R) 100 units/mL injection Inject 0-0.15 mLs (0-15 Units total) into the skin See admin instructions. Via pump 04/29/21  Yes Shamleffer, Melanie Crazier, MD  ?lisinopril (ZESTRIL) 10 MG tablet Take 1 tablet (10 mg total) by mouth daily. 09/27/20  Yes [name], MD  ?metoCLOPramide (REGLAN) 10 MG tablet Take 1 tablet  (10 mg total) by mouth 4 (four) times daily -  before meals and at bedtime. 05/30/21 06/29/21 Yes Dessa Phi, DO  ?pantoprazole (PROTONIX) 40 MG tablet Take 1 tablet (40 mg total) by mouth 2 (two) times daily before a meal. 04/24/21  Yes Aline August, MD  ?promethazine (PHENERGAN) 12.5 MG tablet Take 1 tablet (12.5 mg total) by mouth every 6 (six) hours as needed for nausea or vomiting. 04/24/21  Yes Aline August, MD  ?alum & mag hydroxide-simeth (MAALOX/MYLANTA) 200-200-20 MG/5ML suspension Take 30 mLs by mouth every 6 (six) hours as needed for indigestion or heartburn. ?Patient not taking: Reported on 06/04/2021 04/24/21   Aline August, MD  ?Blood Glucose Monitoring Suppl (TRUE METRIX METER) w/Device KIT 1 Device by Does not apply route 4 (four) times daily. 07/05/19   Libby Maw, MD  ?Continuous Blood Gluc Receiver (DEXCOM G6 RECEIVER) DEVI USE AS DIRECTED ?Patient taking differently: 1 each by Other route as directed. 11/21/20   [name], MD  ?Continuous Blood Gluc Sensor (DEXCOM G6 SENSOR) MISC 1 each by Does not apply route daily. 11/21/20   [name], MD  ?Continuous Blood Gluc Transmit (DEXCOM G6 TRANSMITTER) MISC 1 each by Does not apply route daily. 09/24/20   [name], MD  ?glucose blood (TRUE METRIX BLOOD GLUCOSE TEST) test strip Use as instructed 09/03/20   [name], MD  ?Insulin Pen Needle 31G X 5 MM MISC 1 Device by Does not apply route QID. For use with insulin pens 07/05/19   Libby Maw, MD  ?TRUEPLUS LANCETS 28G MISC assist with checking blood sugar TID and qhs ?Patient taking differently: 1 each by Other route See admin instructions. assist with checking blood sugar TID and qhs 09/10/15   Brayton Caves, PA-C  ?insulin aspart (NOVOLOG) 100 UNIT/ML FlexPen Inject 5 Units into the skin 3 (three) times daily with meals. 02/23/18 07/05/19  Donne Hazel, MD  ? ? ?Physical Exam: ?Wt Readings from Last 3 Encounters:  ?06/04/21 85.1 kg  ?05/30/21 85.1 kg   ?04/20/21 88.2 kg  ? ?Vitals:  ? 06/04/21 1515 06/04/21 1600 06/04/21 1630 06/04/21 1700  ?BP: (!) 154/91 118/68 108/60 109/64  ?Pulse: (!) 112 (!) 107 (!) 106 (!) 104  ?Resp: _0 ?Temp:    98 ?F (36.7 ?C)  ?TempSrc:    Oral  ?SpO2: 100% 100% 100% 100%  ?Weight:      ?Height:      ? ? ? ? ?Constitutional:  Calm & comfortable ?Eyes: PERRLA, lids and conjunctivae normal ?ENT:  ?Mucous membranes are moist.  ?Pharynx clear of exudate   ?Normal dentition.  ?Respiratory:  ?Clear to auscultation bilaterally  ?Normal respiratory effort.  ?Cardiovascular:  ?S1 & S2 heard, regular rate and rhythm ?No Murmurs ?Abdomen:  ?Non distended ?Epigastric tenderness is present ?No masses ?Bowel sounds normal ?Extremities:  ?No clubbing / cyanosis ?No pedal edema  ?  Skin:  ?No rashes, lesions or ulcers ?Neurologic:  ?AAO x 3 ?CN 2-12 grossly intact ?Sensation intact ?Strength 5/5 in all 4 extremities ?Psychiatric:  ?Normal Mood and affect ? ? ? ?Labs on Admission: I have personally reviewed following labs and imaging studies ? ?CBC: ?Recent Labs  ?Lab 05/29/21 ?0453 06/04/21 ?1030  ?WBC 10.7* 16.2*  ?HGB 10.4* 12.1  ?HCT 32.0* 36.3  ?MCV 88.6 87.9  ?PLT 318 423*  ? ?Basic Metabolic Panel: ?Recent Labs  ?Lab 05/29/21 ?0453 06/04/21 ?1030  ?NA 136 140  ?K 4.1 4.0  ?CL 106 105  ?CO2 24 25  ?GLUCOSE 150* 339*  ?BUN 21* 21*  ?CREATININE 0.96 1.19*  ?CALCIUM 8.7* 9.4  ?MG 1.9  --   ? ?GFR: ?Estimated Creatinine Clearance: 79 mL/min (A) (by C-G formula based on SCr of 1.19 mg/dL (H)). ?Liver Function Tests: ?Recent Labs  ?Lab 05/29/21 ?0453 06/04/21 ?1030  ?AST 23 25  ?ALT 20 24  ?ALKPHOS 69 81  ?BILITOT 1.1 0.7  ?PROT 5.6* 7.5  ?ALBUMIN 2.7* 3.4*  ? ?Recent Labs  ?Lab 06/04/21 ?1030  ?LIPASE 25  ? ?No results for input(s): AMMONIA in the last 168 hours. ?Coagulation Profile: ?No results for input(s): INR, PROTIME in the last 168 hours. ?Cardiac Enzymes: ?No results for input(s): CKTOTAL, CKMB, CKMBINDEX, TROPONINI in the last 168  hours. ?BNP (last 3 results) ?No results for input(s): PROBNP in the last 8760 hours. ?HbA1C: ?No results for input(s): HGBA1C in the last 72 hours. ?CBG: ?Recent Labs  ?Lab 05/29/21 ?1114 05/29/21 ?1603 05/30/21 ?6754

## 2021-06-04 NOTE — Progress Notes (Signed)
Discussed clinical presentation and lab abnormalities with Dr. Criss Alvine. ?Her symptoms sound noncoronary.  She presented with intractable nausea and vomiting.  Her ECG is nonischemic.  Troponin shows very mild abnormality in an oscillating pattern that does not fit with an acute coronary event. ?Strongly doubt that the minor troponin abnormalities are due to coronary insufficiency. ?I do not think there is a need to pursue a coronary diagnosis at this time. ?

## 2021-06-04 NOTE — ED Notes (Signed)
Pt given ginger ale.

## 2021-06-04 NOTE — ED Notes (Signed)
I attempted to collect labs and was unsuccessful.  I made the nurse aware 

## 2021-06-04 NOTE — Progress Notes (Signed)
Inpatient Diabetes Program Recommendations ? ?AACE/ADA: New Consensus Statement on Inpatient Glycemic Control (2015) ? ?Target Ranges:  Prepandial:   less than 140 mg/dL ?     Peak postprandial:   less than 180 mg/dL (1-2 hours) ?     Critically ill patients:  140 - 180 mg/dL  ? ?Lab Results  ?Component Value Date  ? GLUCAP 305 (H) 06/04/2021  ? HGBA1C 9.7 (H) 05/28/2021  ? ? ?Review of Glycemic Control ? Latest Reference Range & Units 06/04/21 10:36  ?Glucose-Capillary 70 - 99 mg/dL 305 (H)  ? ?Diabetes history: DM 1 ?Outpatient Diabetes medications:  ?Tandem insulin pump with Novolin R ?Pump settings: ?Basal - 0.5/H ?Bolus CHO ratio 1:12 ?CF 50 with goal of 110 mg/dL ?Patient states she has been off insulin pump for a while (since last admission) ?Current orders for Inpatient glycemic control:  ?None ?Inpatient Diabetes Program Recommendations:   ? ?Patient in ED.  Unclear if she will be admitted.  Per patient she has not resumed her insulin pump since last hospitalization (d/c on 05/30/21).  She is currently only taking Novolin R insulin throughout the day and does not have Rx. For basal insulin.  ?Please order Lantus 10 units daily (first dose now) and supply patient with rx. For basal insulin for home.  Explained to patient by phone that when she is off insulin pump, she needs long acting and short acting insulin (both).  She states that she has a an appointment with Dr. Kelton Pillar next month.  ? ?Thanks,  ?Kelli Perl, RN, BC-ADM ?Inpatient Diabetes Coordinator ?Pager [phone]  (8a-5p) ? ? ?

## 2021-06-04 NOTE — ED Provider Notes (Signed)
?[facility] DEPT ?Provider Note ? ? ?CSN: [id number] ?Arrival date & time: 06/04/21  1019 ? ?  ? ?History ? ?Chief Complaint  ?Patient presents with  ? Abdominal Pain  ? Chest Pain  ? Hyperglycemia  ? ? ?Kelli Hudson is a 32 y.o. female. ? ?HPI ?31 year old female presents with abdominal pain, chest pain, and vomiting.  All of the symptoms started 3 days ago.  These are recurrent and consistent with her chronic gastroparesis.  She states she was vomiting some blood yesterday.  She went to an outside hospital at [facility] and was admitted but left because they were giving her enough pain medicine.  She states the only thing that helps her is Dilaudid and is requesting 2 mg.  She has diffuse abdominal pain which is similar to prior.  No diarrhea. ? ?Home Medications ?Prior to Admission medications   ?Medication Sig Start Date End Date Taking? Authorizing Provider  ?albuterol (VENTOLIN HFA) 108 (90 Base) MCG/ACT inhaler Inhale 2 puffs into the lungs every 6 (six) hours as needed for shortness of breath.   Yes [provider]  ?atorvastatin (LIPITOR) 20 MG tablet Take 1 tablet (20 mg total) by mouth daily. 04/10/21  Yes [name], MD  ?dicyclomine (BENTYL) 20 MG tablet Take 1 tablet (20 mg total) by mouth 3 (three) times daily as needed for spasms. 04/24/21  Yes Aline August, MD  ?gabapentin (NEURONTIN) 300 MG capsule Take 1 capsule (300 mg total) by mouth 3 (three) times daily. 04/24/21  Yes Aline August, MD  ?insulin regular (NOVOLIN R) 100 units/mL injection Inject 0-0.15 mLs (0-15 Units total) into the skin See admin instructions. Via pump 04/29/21  Yes Shamleffer, Melanie Crazier, MD  ?lisinopril (ZESTRIL) 10 MG tablet Take 1 tablet (10 mg total) by mouth daily. 09/27/20  Yes [name], MD  ?metoCLOPramide (REGLAN) 10 MG tablet Take 1 tablet (10 mg total) by mouth 4 (four) times daily -  before meals and at bedtime. 05/30/21 06/29/21 Yes Dessa Phi, DO   ?pantoprazole (PROTONIX) 40 MG tablet Take 1 tablet (40 mg total) by mouth 2 (two) times daily before a meal. 04/24/21  Yes Aline August, MD  ?promethazine (PHENERGAN) 12.5 MG tablet Take 1 tablet (12.5 mg total) by mouth every 6 (six) hours as needed for nausea or vomiting. 04/24/21  Yes Aline August, MD  ?alum & mag hydroxide-simeth (MAALOX/MYLANTA) 200-200-20 MG/5ML suspension Take 30 mLs by mouth every 6 (six) hours as needed for indigestion or heartburn. ?Patient not taking: Reported on 06/04/2021 04/24/21   Aline August, MD  ?Blood Glucose Monitoring Suppl (TRUE METRIX METER) w/Device KIT 1 Device by Does not apply route 4 (four) times daily. 07/05/19   Libby Maw, MD  ?Continuous Blood Gluc Receiver (DEXCOM G6 RECEIVER) DEVI USE AS DIRECTED ?Patient taking differently: 1 each by Other route as directed. 11/21/20   [name], MD  ?Continuous Blood Gluc Sensor (DEXCOM G6 SENSOR) MISC 1 each by Does not apply route daily. 11/21/20   [name], MD  ?Continuous Blood Gluc Transmit (DEXCOM G6 TRANSMITTER) MISC 1 each by Does not apply route daily. 09/24/20   [name], MD  ?glucose blood (TRUE METRIX BLOOD GLUCOSE TEST) test strip Use as instructed 09/03/20   [name], MD  ?Insulin Pen Needle 31G X 5 MM MISC 1 Device by Does not apply route QID. For use with insulin pens 07/05/19   Libby Maw, MD  ?TRUEPLUS LANCETS 28G  [place] assist with checking blood sugar TID and qhs ?Patient taking differently: 1 each by Other route See admin instructions. assist with checking blood sugar TID and qhs 09/10/15   Brayton Caves, PA-C  ?insulin aspart (NOVOLOG) 100 UNIT/ML FlexPen Inject 5 Units into the skin 3 (three) times daily with meals. 02/23/18 07/05/19  Donne Hazel, MD  ?   ? ?Allergies    ?Patient has no known allergies.   ? ?Review of Systems   ?Review of Systems  ?Respiratory:  Positive for shortness of breath.   ?Cardiovascular:  Positive for chest pain.  ?Gastrointestinal:   Positive for abdominal pain, nausea and vomiting. Negative for diarrhea.  ? ?Physical Exam ?Updated Vital Signs ?BP 109/64   Pulse (!) 104   Temp 98 ?F (36.7 ?C) (Oral)   Resp 12   Ht _0  (1.727 m)   Wt 85.1 kg   SpO2 100%   BMI 28.53 kg/m?  ?Physical Exam ?Vitals and nursing note reviewed.  ?Constitutional:   ?   Appearance: She is well-developed. She is not diaphoretic.  ?HENT:  ?   Head: Normocephalic and atraumatic.  ?Cardiovascular:  ?   Rate and Rhythm: Regular rhythm. Tachycardia present.  ?   Heart sounds: Normal heart sounds.  ?Pulmonary:  ?   Effort: Pulmonary effort is normal.  ?   Breath sounds: Normal breath sounds.  ?Abdominal:  ?   Palpations: Abdomen is soft.  ?   Tenderness: There is generalized abdominal tenderness. There is no guarding or rebound.  ?Skin: ?   General: Skin is warm and dry.  ?Neurological:  ?   Mental Status: She is alert.  ? ? ?ED Results / Procedures / Treatments   ?Labs ?(all labs ordered are listed, but only abnormal results are displayed) ?Labs Reviewed  ?COMPREHENSIVE METABOLIC PANEL - Abnormal; Notable for the following components:  ?    Result Value  ? Glucose, Bld 339 (*)   ? BUN 21 (*)   ? Creatinine, Ser 1.19 (*)   ? Albumin 3.4 (*)   ? All other components within normal limits  ?CBC - Abnormal; Notable for the following components:  ? WBC 16.2 (*)   ? Platelets 423 (*)   ? All other components within normal limits  ?URINALYSIS, ROUTINE W REFLEX MICROSCOPIC - Abnormal; Notable for the following components:  ? APPearance HAZY (*)   ? Specific Gravity, Urine 1.035 (*)   ? Glucose, UA >=500 (*)   ? Hgb urine dipstick MODERATE (*)   ? Ketones, ur 20 (*)   ? Protein, ur >=300 (*)   ? Bacteria, UA RARE (*)   ? All other components within normal limits  ?I-STAT BETA HCG BLOOD, ED ([facility], [facility], [facility] ONLY) - Abnormal; Notable for the following components:  ? I-stat hCG, quantitative 8.5 (*)   ? All other components within normal limits  ?CBG MONITORING, ED - Abnormal; Notable  for the following components:  ? Glucose-Capillary 305 (*)   ? All other components within normal limits  ?TROPONIN I (HIGH SENSITIVITY) - Abnormal; Notable for the following components:  ? Troponin I (High Sensitivity) 18 (*)   ? All other components within normal limits  ?TROPONIN I (HIGH SENSITIVITY) - Abnormal; Notable for the following components:  ? Troponin I (High Sensitivity) 39 (*)   ? All other components within normal limits  ?LIPASE, BLOOD  ?HCG, QUANTITATIVE, PREGNANCY  ?CBC  ?CREATININE, SERUM  ?BASIC METABOLIC PANEL  ?CBC  ?TROPONIN  I (HIGH SENSITIVITY)  ? ? ?EKG ?EKG Interpretation ? ?Date/Time:  Tuesday June 04 2021 10:28:50 EDT ?Ventricular Rate:  112 ?PR Interval:  174 ?QRS Duration: 92 ?QT Interval:  305 ?QTC Calculation: 417 ?R Axis:   73 ?Text Interpretation: Sinus tachycardia Borderline T abnormalities, diffuse leads Confirmed by Sherwood Gambler [phone]) on 06/04/2021 12:21:33 PM ? ?Radiology ?DG Chest 2 View ? ?Result Date: 06/04/2021 ?CLINICAL DATA:  Chest pain. EXAM: CHEST - 2 VIEW COMPARISON:  Chest x-ray April 22, 2021. FINDINGS: No consolidation. No visible pleural effusions or pneumothorax. Cardiomediastinal silhouette is within normal limits. No evidence of acute osseous abnormality. IMPRESSION: No evidence of acute cardiopulmonary disease. Electronically Signed   By: Margaretha Sheffield M.D.   On: 06/04/2021 10:50   ? ?Procedures ?Procedures  ? ? ?Medications Ordered in ED ?Medications  ?dicyclomine (BENTYL) tablet 20 mg (has no administration in time range)  ?gabapentin (NEURONTIN) capsule 300 mg (has no administration in time range)  ?pantoprazole (PROTONIX) EC tablet 40 mg (has no administration in time range)  ?insulin glargine-yfgn (SEMGLEE) injection 8 Units (has no administration in time range)  ?insulin aspart (novoLOG) injection 0-9 Units (has no administration in time range)  ?insulin aspart (novoLOG) injection 0-5 Units (has no administration in time range)  ?metoCLOPramide  (REGLAN) injection 10 mg (has no administration in time range)  ?prochlorperazine (COMPAZINE) injection 10 mg (has no administration in time range)  ?enoxaparin (LOVENOX) injection 40 mg (has no administration in time rang

## 2021-06-04 NOTE — ED Notes (Signed)
Unsuccessful IV attempt in L AC, blown. 22g. ?

## 2021-06-05 DIAGNOSIS — E1159 Type 2 diabetes mellitus with other circulatory complications: Secondary | ICD-10-CM | POA: Diagnosis not present

## 2021-06-05 DIAGNOSIS — Z89422 Acquired absence of other left toe(s): Secondary | ICD-10-CM | POA: Diagnosis not present

## 2021-06-05 DIAGNOSIS — K219 Gastro-esophageal reflux disease without esophagitis: Secondary | ICD-10-CM | POA: Diagnosis present

## 2021-06-05 DIAGNOSIS — I152 Hypertension secondary to endocrine disorders: Secondary | ICD-10-CM | POA: Diagnosis present

## 2021-06-05 DIAGNOSIS — E1065 Type 1 diabetes mellitus with hyperglycemia: Secondary | ICD-10-CM | POA: Diagnosis present

## 2021-06-05 DIAGNOSIS — E1042 Type 1 diabetes mellitus with diabetic polyneuropathy: Secondary | ICD-10-CM | POA: Diagnosis present

## 2021-06-05 DIAGNOSIS — E1043 Type 1 diabetes mellitus with diabetic autonomic (poly)neuropathy: Secondary | ICD-10-CM

## 2021-06-05 DIAGNOSIS — K3184 Gastroparesis: Secondary | ICD-10-CM | POA: Diagnosis present

## 2021-06-05 DIAGNOSIS — I248 Other forms of acute ischemic heart disease: Secondary | ICD-10-CM | POA: Diagnosis present

## 2021-06-05 DIAGNOSIS — E039 Hypothyroidism, unspecified: Secondary | ICD-10-CM | POA: Diagnosis present

## 2021-06-05 DIAGNOSIS — E860 Dehydration: Secondary | ICD-10-CM | POA: Diagnosis present

## 2021-06-05 DIAGNOSIS — E1069 Type 1 diabetes mellitus with other specified complication: Secondary | ICD-10-CM | POA: Diagnosis present

## 2021-06-05 DIAGNOSIS — I959 Hypotension, unspecified: Secondary | ICD-10-CM | POA: Diagnosis present

## 2021-06-05 DIAGNOSIS — D638 Anemia in other chronic diseases classified elsewhere: Secondary | ICD-10-CM | POA: Diagnosis present

## 2021-06-05 DIAGNOSIS — Z800 Family history of malignant neoplasm of digestive organs: Secondary | ICD-10-CM | POA: Diagnosis not present

## 2021-06-05 DIAGNOSIS — Z9641 Presence of insulin pump (external) (internal): Secondary | ICD-10-CM | POA: Diagnosis present

## 2021-06-05 DIAGNOSIS — Z833 Family history of diabetes mellitus: Secondary | ICD-10-CM | POA: Diagnosis not present

## 2021-06-05 DIAGNOSIS — D72829 Elevated white blood cell count, unspecified: Secondary | ICD-10-CM | POA: Diagnosis present

## 2021-06-05 DIAGNOSIS — Z794 Long term (current) use of insulin: Secondary | ICD-10-CM | POA: Diagnosis not present

## 2021-06-05 DIAGNOSIS — Z79899 Other long term (current) drug therapy: Secondary | ICD-10-CM | POA: Diagnosis not present

## 2021-06-05 DIAGNOSIS — Z8041 Family history of malignant neoplasm of ovary: Secondary | ICD-10-CM | POA: Diagnosis not present

## 2021-06-05 DIAGNOSIS — Z803 Family history of malignant neoplasm of breast: Secondary | ICD-10-CM | POA: Diagnosis not present

## 2021-06-05 DIAGNOSIS — Z801 Family history of malignant neoplasm of trachea, bronchus and lung: Secondary | ICD-10-CM | POA: Diagnosis not present

## 2021-06-05 DIAGNOSIS — R112 Nausea with vomiting, unspecified: Secondary | ICD-10-CM | POA: Diagnosis not present

## 2021-06-05 LAB — CBC
HCT: 27.2 % — ABNORMAL LOW (ref 36.0–46.0)
Hemoglobin: 8.9 g/dL — ABNORMAL LOW (ref 12.0–15.0)
MCH: 29.4 pg (ref 26.0–34.0)
MCHC: 32.7 g/dL (ref 30.0–36.0)
MCV: 89.8 fL (ref 80.0–100.0)
Platelets: 278 10*3/uL (ref 150–400)
RBC: 3.03 MIL/uL — ABNORMAL LOW (ref 3.87–5.11)
RDW: 13.6 % (ref 11.5–15.5)
WBC: 14 10*3/uL — ABNORMAL HIGH (ref 4.0–10.5)
nRBC: 0 % (ref 0.0–0.2)

## 2021-06-05 LAB — BASIC METABOLIC PANEL
Anion gap: 5 (ref 5–15)
BUN: 17 mg/dL (ref 6–20)
CO2: 23 mmol/L (ref 22–32)
Calcium: 7.9 mg/dL — ABNORMAL LOW (ref 8.9–10.3)
Chloride: 108 mmol/L (ref 98–111)
Creatinine, Ser: 0.9 mg/dL (ref 0.44–1.00)
GFR, Estimated: >60 mL/min (ref >60)
Glucose, Bld: 203 mg/dL — ABNORMAL HIGH (ref 70–99)
Potassium: 3.7 mmol/L (ref 3.5–5.1)
Sodium: 136 mmol/L (ref 135–145)

## 2021-06-05 LAB — GLUCOSE, CAPILLARY
Glucose-Capillary: 252 mg/dL — ABNORMAL HIGH (ref 70–99)
Glucose-Capillary: 218 mg/dL — ABNORMAL HIGH (ref 70–99)
Glucose-Capillary: 219 mg/dL — ABNORMAL HIGH (ref 70–99)
Glucose-Capillary: 180 mg/dL — ABNORMAL HIGH (ref 70–99)

## 2021-06-05 MED ORDER — PANTOPRAZOLE SODIUM 40 MG IV SOLR
40.0000 mg | Freq: Two times a day (BID) | INTRAVENOUS | Status: DC
Start: 2021-06-05 — End: 2021-06-07
  Administered 2021-06-05 – 2021-06-06 (×3): 40 mg via INTRAVENOUS
  Filled 2021-06-05 (×3): qty 10

## 2021-06-05 MED ORDER — HYDRALAZINE HCL 20 MG/ML IJ SOLN: 5.0000 mg | INTRAMUSCULAR | Status: DC | PRN

## 2021-06-05 MED ORDER — INSULIN GLARGINE-YFGN 100 UNIT/ML ~~LOC~~ SOLN: 10.0000 [IU] | Freq: Every day | SUBCUTANEOUS | Status: DC

## 2021-06-05 MED ORDER — LISINOPRIL 10 MG PO TABS
10.0000 mg | ORAL_TABLET | Freq: Every day | ORAL | Status: DC
  Administered 2021-06-05 – 2021-06-07 (×3): 10 mg via ORAL
  Filled 2021-06-05 (×3): qty 1

## 2021-06-05 MED ORDER — INSULIN GLARGINE-YFGN 100 UNIT/ML ~~LOC~~ SOLN
12.0000 [IU] | Freq: Every day | SUBCUTANEOUS | Status: DC
Start: 2021-06-05 — End: 2021-06-07
  Administered 2021-06-05 – 2021-06-06 (×2): 12 [IU] via SUBCUTANEOUS
  Filled 2021-06-05 (×2): qty 0.12

## 2021-06-05 NOTE — Progress Notes (Signed)
Transition of Care (TOC) Screening Note ? ?Patient Details  ?Name: Kelli Hudson ?Date of Birth: 1990/06/03 ? ?Transition of Care (TOC) CM/SW Contact:    ?Ewing Schlein, LCSW ?Phone Number: ?06/05/2021, 10:45 AM ? ?Transition of Care Department [facility]) has reviewed patient and no TOC needs have been identified at this time. We will continue to monitor patient advancement through interdisciplinary progression rounds. If new patient transition needs arise, please place a TOC consult. ?

## 2021-06-05 NOTE — Progress Notes (Signed)
?PROGRESS NOTE ? ? ? Kelli Hudson  QIH:[id number] DOB: 1990-08-11 DOA: 06/04/2021 ?PCP: Loyola Mast, MD  ? ?Brief Narrative:  ?31 y.o. female with medical history of diabetes mellitus type I, diabetic gastroparesis, recurrent admissions for intractable nausea and vomiting with recent admission from 05/28/2021-05/30/2021, gastroesophageal reflux disease, hypertension and hypothyroidism presented with vomiting.  She was started on IV fluids and antiemetics. ? ?Assessment & Plan: ?  ?Intractable nausea and vomiting ?History of recurrent admissions for intractable nausea and vomiting ?Diabetic gastroparesis associated with type 1 diabetes mellitus ?-Still nauseous and vomiting.  Clear liquid diet if tolerated, otherwise NPO.  IV fluids.  Continue around-the-clock Reglan along with as needed Compazine. ?-Switch Protonix to IV ? 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  Continue long-acting insulin along with CBGs with SSI ?-Continue gabapentin ? ?Hypertension ?-Blood pressure on the high side.  Resume home regimen. ? ?Mild troponin elevation ?-Troponins did not trend upwards.  Likely from mild demand ischemia.  No further work-up needed.  No chest pain reported. ? ?DVT prophylaxis: Lovenox ?Code Status: Full ?Family Communication: None at bedside ?Disposition Plan: ?Status is: Inpatient ?Remains inpatient appropriate because: Of severity of illness.  Still requiring IV fluids and still vomiting ? ? ? ?Consultants: None ? ?Procedures: None ? ?Antimicrobials: None ? ? ?Subjective: ?Patient seen and examined at bedside.  Does not feel well and still nauseous and vomiting.  No fever, chest pain or worsening shortness of breath reported ? ?Objective: ?Vitals:  ? 06/04/21 1820 06/04/21 2121 06/05/21 0655 06/05/21 0901  ?BP: 103/68 (!) 141/89 (!) 155/90 (!) 162/115  ?Pulse: (!) 101 (!) 102 (!) 107 (!) 102  ?Resp: 16 18 18 15   ?Temp: 98.7 ?F (37.1 ?C) 98 ?F (36.7  ?C) 98.5 ?F (36.9 ?C) 98.2 ?F (36.8 ?C)  ?TempSrc: Oral Oral Oral Oral  ?SpO2: 99% 100% 98% 99%  ?Weight:      ?Height:      ? ? ?Intake/Output Summary (Last 24 hours) at 06/05/2021 1241 ?Last data filed at 06/05/2021 1000 ?Gross per 24 hour  ?Intake 2107.06 ml  ?Output 0 ml  ?Net 2107.06 ml  ? ?Filed Weights  ? 06/04/21 1028  ?Weight: 85.1 kg  ? ? ?Examination: ? ?General exam: Appears calm and comfortable.  Currently on room air. ?Respiratory system: Bilateral decreased breath sounds at bases, no wheezing ?Cardiovascular system: S1 & S2 heard, intermittently tachycardic  ?gastrointestinal system: Abdomen is nondistended, soft and nontender. Normal bowel sounds heard. ?Extremities: No cyanosis, clubbing, edema  ?Central nervous system: Alert and oriented. No focal neurological deficits. Moving extremities ?Skin: No rashes, lesions or ulcers ?Psychiatry: Affect is mostly flat.  No signs of agitation. ? ? ? ?Data Reviewed: I have personally reviewed following labs and imaging studies ? ?CBC: ?Recent Labs  ?Lab 06/04/21 ?1030 06/05/21 ?0459  ?WBC 16.2* 14.0*  ?HGB 12.1 8.9*  ?HCT 36.3 27.2*  ?MCV 87.9 89.8  ?PLT 423* 278  ? ?Basic Metabolic Panel: ?Recent Labs  ?Lab 06/04/21 ?1030 06/05/21 ?0459  ?NA 140 136  ?K 4.0 3.7  ?CL 105 108  ?CO2 25 23  ?GLUCOSE 339* 203*  ?BUN 21* 17  ?CREATININE 1.19* 0.90  ?CALCIUM 9.4 7.9*  ? ?GFR: ?Estimated Creatinine Clearance: 104.5 mL/min (by C-G formula based on SCr of 0.9 mg/dL). ?Liver Function Tests: ?Recent Labs  ?Lab 06/04/21 ?1030  ?AST 25  ?ALT 24  ?ALKPHOS 81  ?BILITOT 0.7  ?PROT 7.5  ?ALBUMIN 3.4*  ? ?Recent  Labs  ?Lab 06/04/21 ?1030  ?LIPASE 25  ? ?No results for input(s): AMMONIA in the last 168 hours. ?Coagulation Profile: ?No results for input(s): INR, PROTIME in the last 168 hours. ?Cardiac Enzymes: ?No results for input(s): CKTOTAL, CKMB, CKMBINDEX, TROPONINI in the last 168 hours. ?BNP (last 3 results) ?No results for input(s): PROBNP in the last 8760  hours. ?HbA1C: ?No results for input(s): HGBA1C in the last 72 hours. ?CBG: ?Recent Labs  ?Lab 05/30/21 ?0749 06/04/21 ?1036 06/04/21 ?2146 06/05/21 ?0726 06/05/21 ?1201  ?GLUCAP 267* 305* 360* 252* 218*  ? ?Lipid Profile: ?No results for input(s): CHOL, HDL, LDLCALC, TRIG, CHOLHDL, LDLDIRECT in the last 72 hours. ?Thyroid Function Tests: ?No results for input(s): TSH, T4TOTAL, FREET4, T3FREE, THYROIDAB in the last 72 hours. ?Anemia Panel: ?No results for input(s): VITAMINB12, FOLATE, FERRITIN, TIBC, IRON, RETICCTPCT in the last 72 hours. ?Sepsis Labs: ?No results for input(s): PROCALCITON, LATICACIDVEN in the last 168 hours. ? ?No results found for this or any previous visit (from the past 240 hour(s)).  ? ? ? ? ? ?Radiology Studies: ?DG Chest 2 View ? ?Result Date: 06/04/2021 ?CLINICAL DATA:  Chest pain. EXAM: CHEST - 2 VIEW COMPARISON:  Chest x-ray April 22, 2021. FINDINGS: No consolidation. No visible pleural effusions or pneumothorax. Cardiomediastinal silhouette is within normal limits. No evidence of acute osseous abnormality. IMPRESSION: No evidence of acute cardiopulmonary disease. Electronically Signed   By: Feliberto Harts M.D.   On: 06/04/2021 10:50   ? ? ? ? ? ?Scheduled Meds: ? enoxaparin (LOVENOX) injection  40 mg Subcutaneous Q24H  ? gabapentin  300 mg Oral TID  ? insulin aspart  0-5 Units Subcutaneous QHS  ? insulin aspart  0-9 Units Subcutaneous TID WC  ? insulin glargine-yfgn  8 Units Subcutaneous QHS  ? metoCLOPramide (REGLAN) injection  10 mg Intravenous Q6H  ? pantoprazole (PROTONIX) IV  40 mg Intravenous Q12H  ? ?Continuous Infusions: ? sodium chloride 100 mL/hr at 06/05/21 0174  ? ? ? ? ? ? ? ? ?Glade Lloyd, MD ?[facility] ?06/05/2021, 12:41 PM  ? ?

## 2021-06-05 NOTE — Progress Notes (Signed)
Inpatient Diabetes Program Recommendations ? ?AACE/ADA: New Consensus Statement on Inpatient Glycemic Control (2015) ? ?Target Ranges:  Prepandial:   less than 140 mg/dL ?     Peak postprandial:   less than 180 mg/dL (1-2 hours) ?     Critically ill patients:  140 - 180 mg/dL  ? ?Lab Results  ?Component Value Date  ? GLUCAP 218 (H) 06/05/2021  ? HGBA1C 9.7 (H) 05/28/2021  ? ? ?Review of Glycemic Control ? Latest Reference Range & Units 06/04/21 21:46 06/05/21 07:26 06/05/21 12:01  ?Glucose-Capillary 70 - 99 mg/dL 413 (H) 244 (H) 010 (H)  ?Diabetes history: DM 1 ?Outpatient Diabetes medications:  ?Tandem insulin pump with Novolin R ?Pump settings: ?Basal - 0.5/H ?Bolus CHO ratio 1:12 ?CF 50 with goal of 110 mg/dL ?Patient states she has been off insulin pump for a while (since last admission) ?Current orders for Inpatient glycemic control:  ?Novolog sensitive tid with meals and HS ?Semglee 8 units q HS ?Inpatient Diabetes Program Recommendations:   ? ?Consider increasing Semglee to 12 units q HS.  Also consider adding Novolog meal coverage 2 units tid with meals (hold if patient eats less than 50% or NPO).  ? ?Thanks,  ?Beryl Meager, RN, BC-ADM ?Inpatient Diabetes Coordinator ?Pager [phone]  (8a-5p) ? ?

## 2021-06-06 ENCOUNTER — Ambulatory Visit: Payer: 59 | Admitting: Gastroenterology

## 2021-06-06 DIAGNOSIS — R112 Nausea with vomiting, unspecified: Secondary | ICD-10-CM | POA: Diagnosis not present

## 2021-06-06 DIAGNOSIS — D638 Anemia in other chronic diseases classified elsewhere: Secondary | ICD-10-CM

## 2021-06-06 DIAGNOSIS — K3184 Gastroparesis: Secondary | ICD-10-CM | POA: Diagnosis not present

## 2021-06-06 DIAGNOSIS — E1159 Type 2 diabetes mellitus with other circulatory complications: Secondary | ICD-10-CM | POA: Diagnosis not present

## 2021-06-06 DIAGNOSIS — E1043 Type 1 diabetes mellitus with diabetic autonomic (poly)neuropathy: Secondary | ICD-10-CM | POA: Diagnosis not present

## 2021-06-06 LAB — CBC WITH DIFFERENTIAL/PLATELET
Abs Immature Granulocytes: 0.02 10*3/uL (ref 0.00–0.07)
Basophils Absolute: 0.1 10*3/uL (ref 0.0–0.1)
Basophils Relative: 1 %
Eosinophils Absolute: 0.2 10*3/uL (ref 0.0–0.5)
Eosinophils Relative: 2 %
HCT: 31.9 % — ABNORMAL LOW (ref 36.0–46.0)
Hemoglobin: 10.3 g/dL — ABNORMAL LOW (ref 12.0–15.0)
Immature Granulocytes: 0 %
Lymphocytes Relative: 57 %
Lymphs Abs: 5.6 10*3/uL — ABNORMAL HIGH (ref 0.7–4.0)
MCH: 29.3 pg (ref 26.0–34.0)
MCHC: 32.3 g/dL (ref 30.0–36.0)
MCV: 90.6 fL (ref 80.0–100.0)
Monocytes Absolute: 0.7 10*3/uL (ref 0.1–1.0)
Monocytes Relative: 7 %
Neutro Abs: 3.2 10*3/uL (ref 1.7–7.7)
Neutrophils Relative %: 33 %
Platelets: 302 10*3/uL (ref 150–400)
RBC: 3.52 MIL/uL — ABNORMAL LOW (ref 3.87–5.11)
RDW: 13.4 % (ref 11.5–15.5)
WBC: 9.7 10*3/uL (ref 4.0–10.5)
nRBC: 0 % (ref 0.0–0.2)

## 2021-06-06 LAB — BASIC METABOLIC PANEL
Anion gap: 5 (ref 5–15)
BUN: 12 mg/dL (ref 6–20)
CO2: 24 mmol/L (ref 22–32)
Calcium: 8.9 mg/dL (ref 8.9–10.3)
Chloride: 108 mmol/L (ref 98–111)
Creatinine, Ser: 0.86 mg/dL (ref 0.44–1.00)
GFR, Estimated: >60 mL/min (ref >60)
Glucose, Bld: 194 mg/dL — ABNORMAL HIGH (ref 70–99)
Potassium: 3.8 mmol/L (ref 3.5–5.1)
Sodium: 137 mmol/L (ref 135–145)

## 2021-06-06 LAB — GLUCOSE, CAPILLARY
Glucose-Capillary: 265 mg/dL — ABNORMAL HIGH (ref 70–99)
Glucose-Capillary: 251 mg/dL — ABNORMAL HIGH (ref 70–99)
Glucose-Capillary: 207 mg/dL — ABNORMAL HIGH (ref 70–99)
Glucose-Capillary: 164 mg/dL — ABNORMAL HIGH (ref 70–99)
Glucose-Capillary: 254 mg/dL — ABNORMAL HIGH (ref 70–99)

## 2021-06-06 LAB — MAGNESIUM: Magnesium: 1.5 mg/dL — ABNORMAL LOW (ref 1.7–2.4)

## 2021-06-06 MED ORDER — HYDROMORPHONE HCL 1 MG/ML IJ SOLN
0.5000 mg | Freq: Four times a day (QID) | INTRAMUSCULAR | Status: DC | PRN
  Administered 2021-06-06 (×2): 0.5 mg via INTRAVENOUS
  Filled 2021-06-06 (×3): qty 0.5

## 2021-06-06 MED ORDER — TRAMADOL HCL 50 MG PO TABS
50.0000 mg | ORAL_TABLET | Freq: Four times a day (QID) | ORAL | Status: DC | PRN
  Filled 2021-06-06: qty 1

## 2021-06-06 MED ORDER — MAGNESIUM SULFATE 2 GM/50ML IV SOLN
2.0000 g | Freq: Once | INTRAVENOUS | Status: AC
  Administered 2021-06-06: 2 g via INTRAVENOUS
  Filled 2021-06-06: qty 50

## 2021-06-06 NOTE — Progress Notes (Signed)
?PROGRESS NOTE ? ? ? Kelli Hudson  JIR:[id number] DOB: [birth year] DOA: 06/04/2021 ?PCP: Loyola Mast, MD  ? ?Brief Narrative:  ?31 y.o. female with medical history of diabetes mellitus type I, diabetic gastroparesis, recurrent admissions for intractable nausea and vomiting with recent admission from 05/28/2021-05/30/2021, gastroesophageal reflux disease, hypertension and hypothyroidism presented with vomiting.  She was started on IV fluids and antiemetics. ? ?Assessment & Plan: ?  ?Intractable nausea and vomiting ?History of recurrent admissions for intractable nausea and vomiting ?Diabetic gastroparesis associated with type 1 diabetes mellitus ?-Currently tolerating clear liquid diet.  Advance diet to soft diet.  Continue IV fluids.  Continue around-the-clock Reglan along with as needed Compazine. ?-Continue IV Protonix ? 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  Continue CBGs with SSI.  Increase long-acting insulin to 16 units daily.  Add NovoLog 2 units 3 times daily with meals if eats at least 50%. ?-Continue gabapentin ? ?Hypertension ?-Blood pressure currently improved.  Resume home regimen. ? ?Leukocytosis ?-Resolved ? ?Anemia of chronic disease ?-Hemoglobin stable.  Monitor intermittently. ? ?Mild troponin elevation ?-Troponins did not trend upwards.  Likely from mild demand ischemia.  No further work-up needed.  No chest pain reported. ? ?DVT prophylaxis: Lovenox ?Code Status: Full ?Family Communication: None at bedside ?Disposition Plan: ?Status is: Inpatient ?Remains inpatient appropriate because: Of severity of illness.  Still requiring IV fluids.  Will advance diet today.  Possible discharge tomorrow if tolerates diet. ? ? ?Consultants: None ? ?Procedures: None ? ?Antimicrobials: None ? ? ?Subjective: ?Patient seen and examined at bedside.  Feels better and wants to try soft food today.  No fever, worsening abdominal pain, diarrhea  reported.  Currently no vomiting. ?Objective: ?Vitals:  ? 06/05/21 1703 06/05/21 2031 06/06/21 0522 06/06/21 1004  ?BP: (!) 144/97 113/73 115/70 (!) 167/110  ?Pulse: (!) 105 (!) 107 97 (!) 108  ?Resp:  18 18 18   ?Temp:  98.5 ?F (36.9 ?C) 98.7 ?F (37.1 ?C)   ?TempSrc:  Oral Oral   ?SpO2:  99% 97%   ?Weight:      ?Height:      ? ? ?Intake/Output Summary (Last 24 hours) at 06/06/2021 1040 ?Last data filed at 06/06/2021 0500 ?Gross per 24 hour  ?Intake 1080 ml  ?Output 0 ml  ?Net 1080 ml  ? ? ?Filed Weights  ? 06/04/21 1028  ?Weight: 85.1 kg  ? ? ?Examination: ? ?General exam: No distress.  On room air currently.   ?Respiratory system: Decreased breath sounds at bases bilaterally ?Cardiovascular system: Tachycardic intermittently; S1-S2 heard ?gastrointestinal system: Abdomen is distended slightly; soft and nontender.  Bowel sounds are heard  ?extremities: No clubbing or edema  ?Central nervous system: Awake; slow to respond.  No focal neurological deficits.  Moves extremities  ?skin: No obvious ecchymosis/rashes ?Psychiatry: Currently not agitated.  Flat affect ? ? ?Data Reviewed: I have personally reviewed following labs and imaging studies ? ?CBC: ?Recent Labs  ?Lab 06/04/21 ?1030 06/05/21 ?0459 06/06/21 ?0418  ?WBC 16.2* 14.0* 9.7  ?NEUTROABS  --   --  3.2  ?HGB 12.1 8.9* 10.3*  ?HCT 36.3 27.2* 31.9*  ?MCV 87.9 89.8 90.6  ?PLT 423* 278 302  ? ? ?Basic Metabolic Panel: ?Recent Labs  ?Lab 06/04/21 ?1030 06/05/21 ?0459 06/06/21 ?0418  ?NA 140 136 137  ?K 4.0 3.7 3.8  ?CL 105 108 108  ?CO2 25 23 24   ?GLUCOSE 339* 203* 194*  ?BUN 21* 17 12  ?CREATININE 1.19* 0.90  0.86  ?CALCIUM 9.4 7.9* 8.9  ?MG  --   --  1.5*  ? ? ?GFR: ?Estimated Creatinine Clearance: 109.3 mL/min (by C-G formula based on SCr of 0.86 mg/dL). ?Liver Function Tests: ?Recent Labs  ?Lab 06/04/21 ?1030  ?AST 25  ?ALT 24  ?ALKPHOS 81  ?BILITOT 0.7  ?PROT 7.5  ?ALBUMIN 3.4*  ? ? ?Recent Labs  ?Lab 06/04/21 ?1030  ?LIPASE 25  ? ? ?No results for input(s):  AMMONIA in the last 168 hours. ?Coagulation Profile: ?No results for input(s): INR, PROTIME in the last 168 hours. ?Cardiac Enzymes: ?No results for input(s): CKTOTAL, CKMB, CKMBINDEX, TROPONINI in the last 168 hours. ?BNP (last 3 results) ?No results for input(s): PROBNP in the last 8760 hours. ?HbA1C: ?No results for input(s): HGBA1C in the last 72 hours. ?CBG: ?Recent Labs  ?Lab 06/05/21 ?0726 06/05/21 ?1201 06/05/21 ?1705 06/05/21 ?2116 06/06/21 ?0744  ?GLUCAP 252* 218* 219* 180* 265*  ? ? ?Lipid Profile: ?No results for input(s): CHOL, HDL, LDLCALC, TRIG, CHOLHDL, LDLDIRECT in the last 72 hours. ?Thyroid Function Tests: ?No results for input(s): TSH, T4TOTAL, FREET4, T3FREE, THYROIDAB in the last 72 hours. ?Anemia Panel: ?No results for input(s): VITAMINB12, FOLATE, FERRITIN, TIBC, IRON, RETICCTPCT in the last 72 hours. ?Sepsis Labs: ?No results for input(s): PROCALCITON, LATICACIDVEN in the last 168 hours. ? ?No results found for this or any previous visit (from the past 240 hour(s)).  ? ? ? ? ? ?Radiology Studies: ?DG Chest 2 View ? ?Result Date: 06/04/2021 ?CLINICAL DATA:  Chest pain. EXAM: CHEST - 2 VIEW COMPARISON:  Chest x-ray April 22, 2021. FINDINGS: No consolidation. No visible pleural effusions or pneumothorax. Cardiomediastinal silhouette is within normal limits. No evidence of acute osseous abnormality. IMPRESSION: No evidence of acute cardiopulmonary disease. Electronically Signed   By: Feliberto Harts M.D.   On: 06/04/2021 10:50   ? ? ? ? ? ?Scheduled Meds: ? enoxaparin (LOVENOX) injection  40 mg Subcutaneous Q24H  ? gabapentin  300 mg Oral TID  ? insulin aspart  0-5 Units Subcutaneous QHS  ? insulin aspart  0-9 Units Subcutaneous TID WC  ? insulin glargine-yfgn  12 Units Subcutaneous QHS  ? lisinopril  10 mg Oral Daily  ? metoCLOPramide (REGLAN) injection  10 mg Intravenous Q6H  ? pantoprazole (PROTONIX) IV  40 mg Intravenous Q12H  ? ?Continuous Infusions: ? sodium chloride 100 mL/hr at 06/06/21  3976  ? ? ? ? ? ? ? ? ?Glade Lloyd, MD ?[facility] ?06/06/2021, 10:40 AM  ? ?

## 2021-06-06 NOTE — Plan of Care (Signed)

## 2021-06-06 NOTE — Progress Notes (Signed)
Inpatient Diabetes Program Recommendations ? ?AACE/ADA: New Consensus Statement on Inpatient Glycemic Control (2015) ? ?Target Ranges:  Prepandial:   less than 140 mg/dL ?     Peak postprandial:   less than 180 mg/dL (1-2 hours) ?     Critically ill patients:  140 - 180 mg/dL  ? ?Lab Results  ?Component Value Date  ? GLUCAP 265 (H) 06/06/2021  ? HGBA1C 9.7 (H) 05/28/2021  ? ? ?Review of Glycemic Control ? Latest Reference Range & Units 06/05/21 07:26 06/05/21 12:01 06/05/21 17:05 06/05/21 21:16 06/06/21 07:44  ?Glucose-Capillary 70 - 99 mg/dL 540 (H) 086 (H) 761 (H) 180 (H) 265 (H)  ?(H): Data is abnormally high ? ?Diabetes history: DM1(does not make insulin.  Needs correction, basal and meal coverage) ? ?Outpatient Diabetes medications:  ?Tandem insulin pump with Novolin R  ?Pump settings: ?Basal - 0.5/H ?Bolus CHO ratio 1:12 ?CF 50 with goal of 110 mg/dL ?Patient states she has been off insulin pump for a while (since last admission) ?Taking Novolin R throughout the day ? ?Current orders for Inpatient glycemic control:  ?Novolog sensitive tid with meals and HS ?Semglee 12 units q HS ? ?Inpatient Diabetes Program Recommendations:   ? ?Semglee 16 units QD ?Novolog 2 units TID with meals if eats at least 50% ? ?Will continue to follow while inpatient. ? ?Thank you, ?Dulce Sellar, MSN, RN ?Diabetes Coordinator ?Inpatient Diabetes Program ?[phone] (team pager from 8a-5p) ? ? ? ?

## 2021-06-07 DIAGNOSIS — E1043 Type 1 diabetes mellitus with diabetic autonomic (poly)neuropathy: Secondary | ICD-10-CM | POA: Diagnosis not present

## 2021-06-07 DIAGNOSIS — D638 Anemia in other chronic diseases classified elsewhere: Secondary | ICD-10-CM

## 2021-06-07 DIAGNOSIS — D72829 Elevated white blood cell count, unspecified: Secondary | ICD-10-CM

## 2021-06-07 DIAGNOSIS — K3184 Gastroparesis: Secondary | ICD-10-CM | POA: Diagnosis not present

## 2021-06-07 DIAGNOSIS — R112 Nausea with vomiting, unspecified: Secondary | ICD-10-CM | POA: Diagnosis not present

## 2021-06-07 LAB — BASIC METABOLIC PANEL
Anion gap: 4 — ABNORMAL LOW (ref 5–15)
BUN: 9 mg/dL (ref 6–20)
CO2: 26 mmol/L (ref 22–32)
Calcium: 8.5 mg/dL — ABNORMAL LOW (ref 8.9–10.3)
Chloride: 109 mmol/L (ref 98–111)
Creatinine, Ser: 0.83 mg/dL (ref 0.44–1.00)
GFR, Estimated: >60 mL/min (ref >60)
Glucose, Bld: 203 mg/dL — ABNORMAL HIGH (ref 70–99)
Potassium: 3.8 mmol/L (ref 3.5–5.1)
Sodium: 139 mmol/L (ref 135–145)

## 2021-06-07 LAB — GLUCOSE, CAPILLARY: Glucose-Capillary: 215 mg/dL — ABNORMAL HIGH (ref 70–99)

## 2021-06-07 LAB — MAGNESIUM: Magnesium: 1.8 mg/dL (ref 1.7–2.4)

## 2021-06-07 MED ORDER — INSULIN GLARGINE-YFGN 100 UNIT/ML ~~LOC~~ SOLN
16.0000 [IU] | Freq: Every day | SUBCUTANEOUS | Status: DC
  Filled 2021-06-07: qty 0.16

## 2021-06-07 NOTE — Plan of Care (Signed)

## 2021-06-07 NOTE — Discharge Summary (Signed)
Physician Discharge Summary  ?Kelli Hudson YDX:[id number] DOB: 03/20/1990 DOA: 06/04/2021 ? ?PCP: [name], MD ? ?Admit date: 06/04/2021 ?Discharge date: 06/07/2021 ? ?Admitted From: Home ?Disposition: Home ? ?Recommendations for Outpatient Follow-up:  ?Follow up with PCP in 1 week with repeat CBC/BMP ?Outpatient follow-up with GI ?Follow up in ED if symptoms worsen or new appear ? ? ?Home Health: No ?Equipment/Devices: None ? ?Discharge Condition: Stable ?CODE STATUS: Full ?Diet recommendation: Heart healthy/carb modified ? ?Brief/Interim Summary: ?31 y.o. female with medical history of diabetes mellitus type I, diabetic gastroparesis, recurrent admissions for intractable nausea and vomiting with recent admission from 05/28/2021-05/30/2021, gastroesophageal reflux disease, hypertension and hypothyroidism presented with vomiting.  She was started on IV fluids and antiemetics.  During the hospitalization, her condition has gradually improved.  Her diet has been gradually advanced and currently she is tolerating soft diet.  She will be discharged home today with outpatient follow-up with PCP and GI. ? ?Discharge Diagnoses:  ?Intractable nausea and vomiting ?History of recurrent admissions for intractable nausea and vomiting ?Diabetic gastroparesis associated with type 1 diabetes mellitus ?-Initially kept n.p.o. but diet has been gradually advanced.  Currently tolerating soft diet.  Treated with IV fluids and IV Protonix.   ?-Continue Reglan and oral Protonix on discharge.  Outpatient follow-up with GI. ?-Discharge patient home today. ? 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  Currently on long-acting insulin and CBGs with SSI.  Resume insulin pump on discharge.  Outpatient follow-up with PCP and/or endocrinology. ?-Continue gabapentin ?  ?Hypertension ?-Blood pressure currently improved.  Continue home regimen. ?  ?Leukocytosis ?-Resolved ?  ?Anemia of  chronic disease ?-Hemoglobin stable.  Outpatient follow-up. ? ?Mild troponin elevation ?-Troponins did not trend upwards.  Likely from mild demand ischemia.  No further work-up needed.  No chest pain reported. ? ? ?Discharge Instructions ? ?Discharge Instructions   ? ? Diet - low sodium heart healthy   Complete by: As directed ?  ? Diet Carb Modified   Complete by: As directed ?  ? Increase activity slowly   Complete by: As directed ?  ? ?  ? ?Allergies as of 06/07/2021   ?No Known Allergies ?  ? ?  ?Medication List  ?  ? ?TAKE these medications   ? ?albuterol 108 (90 Base) MCG/ACT inhaler ?Commonly known as: VENTOLIN HFA ?Inhale 2 puffs into the lungs every 6 (six) hours as needed for shortness of breath. ?  ?alum & mag hydroxide-simeth 200-200-20 MG/5ML suspension ?Commonly known as: MAALOX/MYLANTA ?Take 30 mLs by mouth every 6 (six) hours as needed for indigestion or heartburn. ?  ?atorvastatin 20 MG tablet ?Commonly known as: LIPITOR ?Take 1 tablet (20 mg total) by mouth daily. ?  ?Dexcom G6 Receiver Kerrin Mo ?USE AS DIRECTED ?What changed:  ?how much to take ?how to take this ?when to take this ?  ?Dexcom G6 Sensor Misc ?1 each by Does not apply route daily. ?  ?Dexcom G6 Transmitter Misc ?1 each by Does not apply route daily. ?  ?dicyclomine 20 MG tablet ?Commonly known as: BENTYL ?Take 1 tablet (20 mg total) by mouth 3 (three) times daily as needed for spasms. ?  ?gabapentin 300 MG capsule ?Commonly known as: NEURONTIN ?Take 1 capsule (300 mg total) by mouth 3 (three) times daily. ?  ?Insulin Pen Needle 31G X 5 MM Misc ?1 Device by Does not apply route QID. For use with insulin pens ?  ?insulin regular 100 units/mL injection ?  Commonly known as: NovoLIN R ?Inject 0-0.15 mLs (0-15 Units total) into the skin See admin instructions. Via pump ?  ?lisinopril 10 MG tablet ?Commonly known as: ZESTRIL ?Take 1 tablet (10 mg total) by mouth daily. ?  ?metoCLOPramide 10 MG tablet ?Commonly known as: Reglan ?Take 1 tablet (10  mg total) by mouth 4 (four) times daily -  before meals and at bedtime. ?  ?pantoprazole 40 MG tablet ?Commonly known as: PROTONIX ?Take 1 tablet (40 mg total) by mouth 2 (two) times daily before a meal. ?  ?promethazine 12.5 MG tablet ?Commonly known as: PHENERGAN ?Take 1 tablet (12.5 mg total) by mouth every 6 (six) hours as needed for nausea or vomiting. ?  ?True Metrix Blood Glucose Test test strip ?Generic drug: glucose blood ?Use as instructed ?  ?True Metrix Meter w/Device Kit ?1 Device by Does not apply route 4 (four) times daily. ?  ?TRUEplus Lancets 28G Misc ?assist with checking blood sugar TID and qhs ?What changed:  ?how much to take ?how to take this ?when to take this ?  ? ?  ? ? Follow-up Information   ? ? [name], MD. Schedule an appointment as soon as possible for a visit in 1 week(s).   ?Specialty: Family Medicine ?Contact information: ?[address] ?[place] [address] ?[phone] ? ? ?  ?  ? ?  ?  ? ?  ? ?No Known Allergies ? ?Consultations: ?None ? ? ?Procedures/Studies: ?DG Chest 2 View ? ?Result Date: 06/04/2021 ?CLINICAL DATA:  Chest pain. EXAM: CHEST - 2 VIEW COMPARISON:  Chest x-ray April 22, 2021. FINDINGS: No consolidation. No visible pleural effusions or pneumothorax. Cardiomediastinal silhouette is within normal limits. No evidence of acute osseous abnormality. IMPRESSION: No evidence of acute cardiopulmonary disease. Electronically Signed   By: Margaretha Sheffield M.D.   On: 06/04/2021 10:50  ? ?US RENAL ? ?Result Date: 05/28/2021 ?CLINICAL DATA:  Acute kidney injury EXAM: RENAL / URINARY TRACT ULTRASOUND COMPLETE COMPARISON:  None. FINDINGS: Right Kidney: Renal measurements: 10.2 x 4.6 x 6.3 cm = volume: 151 mL. Echogenicity within normal limits. No mass or hydronephrosis visualized. Left Kidney: Renal measurements: 11.8 x 5.6 x 5.4 cm = volume: 185 mL. Echogenicity within normal limits. No mass or hydronephrosis visualized. Bladder: Appears normal for degree of  bladder distention. Other: None. IMPRESSION: No significant sonographic abnormality of the kidneys. Electronically Signed   By: Miachel Roux M.D.   On: 05/28/2021 11:44  ? ?DG Foot Complete Left ? ?Result Date: 05/17/2021 ?Please see detailed radiograph report in office note. ? ?US THYROID ? ?Result Date: 05/15/2021 ?CLINICAL DATA:  Multinodular goiter follow-up EXAM: THYROID ULTRASOUND TECHNIQUE: Ultrasound examination of the thyroid gland and adjacent soft tissues was performed. COMPARISON:  03/15/2020 FINDINGS: Parenchymal Echotexture: Mildly heterogenous Isthmus: 0.3 cm Right lobe: 6.4 x 1.6 x 2.4 cm Left lobe: 6.0 x 2.1 x 2.1 cm _________________________________________________________ Estimated total number of nodules >/= 1 cm: 2 Number of spongiform nodules >/=  2 cm not described below (TR1): 0 Number of mixed cystic and solid nodules >/= 1.5 cm not described below (Paden): 0 _________________________________________________________ Nodule # 1: Prior biopsy: No Location: Right; mid Maximum size: 1.3 cm; Other 2 dimensions: 0.9 x 0.7 cm, previously, 1.3 x 0.9 x 0.8 cm Composition: solid/almost completely solid (2) Echogenicity: isoechoic (1) Shape: taller-than-wide (3) Margins: ill-defined (0) Echogenic foci: none (0) ACR TI-RADS total points: 6. ACR TI-RADS risk category:  TR4 (4-6 points). Significant change in size (>/=  20% in two dimensions and minimal increase of 2 mm): No Change in features: No Change in ACR TI-RADS risk category: No ACR TI-RADS recommendations: *Given size (>/= 1 - 1.4 cm) and appearance, a follow-up ultrasound in 1 year should be considered based on TI-RADS criteria. _________________________________________________________ Nodule # 2: Prior biopsy: No Location: Right; inferior Maximum size: 1.3 cm; Other 2 dimensions: 1.2 x 1.1 cm, previously, 1.5 x 1.3 x 1.2 cm Composition: solid/almost completely solid (2) Echogenicity: hypoechoic (2) Shape: not taller-than-wide (0) Margins: smooth (0)  Echogenic foci: none (0) ACR TI-RADS total points: 4. ACR TI-RADS risk category:  TR4 (4-6 points). Significant change in size (>/= 20% in two dimensions and minimal increase of 2 mm): No Change in fe

## 2021-06-07 NOTE — Plan of Care (Signed)
Instructions were reviewed with patient. All questions were answered. Patient was transported to main entrance by wheelchair. ° °

## 2021-06-10 ENCOUNTER — Encounter (HOSPITAL_COMMUNITY): Payer: Self-pay | Admitting: Emergency Medicine

## 2021-06-10 ENCOUNTER — Inpatient Hospital Stay (HOSPITAL_COMMUNITY)
Admission: EM | Admit: 2021-06-10 | Discharge: 2021-06-14 | DRG: 638 | Disposition: A | Discharge status: Home / Self Care | Payer: 59 | Attending: Internal Medicine | Admitting: Internal Medicine

## 2021-06-10 DIAGNOSIS — F32A Depression, unspecified: Secondary | ICD-10-CM | POA: Diagnosis present

## 2021-06-10 DIAGNOSIS — Z8249 Family history of ischemic heart disease and other diseases of the circulatory system: Secondary | ICD-10-CM

## 2021-06-10 DIAGNOSIS — K219 Gastro-esophageal reflux disease without esophagitis: Secondary | ICD-10-CM | POA: Diagnosis present

## 2021-06-10 DIAGNOSIS — Z79899 Other long term (current) drug therapy: Secondary | ICD-10-CM

## 2021-06-10 DIAGNOSIS — E1165 Type 2 diabetes mellitus with hyperglycemia: Secondary | ICD-10-CM | POA: Diagnosis present

## 2021-06-10 DIAGNOSIS — N1832 Chronic kidney disease, stage 3b: Secondary | ICD-10-CM | POA: Diagnosis present

## 2021-06-10 DIAGNOSIS — Z833 Family history of diabetes mellitus: Secondary | ICD-10-CM

## 2021-06-10 DIAGNOSIS — I152 Hypertension secondary to endocrine disorders: Secondary | ICD-10-CM | POA: Diagnosis present

## 2021-06-10 DIAGNOSIS — E1069 Type 1 diabetes mellitus with other specified complication: Secondary | ICD-10-CM | POA: Diagnosis present

## 2021-06-10 DIAGNOSIS — E1159 Type 2 diabetes mellitus with other circulatory complications: Secondary | ICD-10-CM | POA: Diagnosis present

## 2021-06-10 DIAGNOSIS — N179 Acute kidney failure, unspecified: Secondary | ICD-10-CM | POA: Diagnosis present

## 2021-06-10 DIAGNOSIS — E1043 Type 1 diabetes mellitus with diabetic autonomic (poly)neuropathy: Secondary | ICD-10-CM | POA: Diagnosis not present

## 2021-06-10 DIAGNOSIS — R112 Nausea with vomiting, unspecified: Secondary | ICD-10-CM | POA: Diagnosis not present

## 2021-06-10 DIAGNOSIS — F419 Anxiety disorder, unspecified: Secondary | ICD-10-CM | POA: Diagnosis present

## 2021-06-10 DIAGNOSIS — Z91148 Patient's other noncompliance with medication regimen for other reason: Secondary | ICD-10-CM

## 2021-06-10 DIAGNOSIS — R739 Hyperglycemia, unspecified: Secondary | ICD-10-CM | POA: Diagnosis present

## 2021-06-10 DIAGNOSIS — E785 Hyperlipidemia, unspecified: Secondary | ICD-10-CM | POA: Diagnosis present

## 2021-06-10 DIAGNOSIS — Z818 Family history of other mental and behavioral disorders: Secondary | ICD-10-CM

## 2021-06-10 DIAGNOSIS — K3184 Gastroparesis: Secondary | ICD-10-CM | POA: Diagnosis present

## 2021-06-10 DIAGNOSIS — E0865 Diabetes mellitus due to underlying condition with hyperglycemia: Secondary | ICD-10-CM

## 2021-06-10 DIAGNOSIS — Z89422 Acquired absence of other left toe(s): Secondary | ICD-10-CM

## 2021-06-10 DIAGNOSIS — E1065 Type 1 diabetes mellitus with hyperglycemia: Secondary | ICD-10-CM | POA: Diagnosis not present

## 2021-06-10 DIAGNOSIS — E0910 Drug or chemical induced diabetes mellitus with ketoacidosis without coma: Secondary | ICD-10-CM

## 2021-06-10 DIAGNOSIS — Z794 Long term (current) use of insulin: Secondary | ICD-10-CM

## 2021-06-10 DIAGNOSIS — E039 Hypothyroidism, unspecified: Secondary | ICD-10-CM | POA: Diagnosis present

## 2021-06-10 LAB — CBC WITH DIFFERENTIAL/PLATELET
Abs Immature Granulocytes: 0.03 10*3/uL (ref 0.00–0.07)
Basophils Absolute: 0 10*3/uL (ref 0.0–0.1)
Basophils Relative: 0 %
Eosinophils Absolute: 0.2 10*3/uL (ref 0.0–0.5)
Eosinophils Relative: 2 %
HCT: 38.7 % (ref 36.0–46.0)
Hemoglobin: 13.1 g/dL (ref 12.0–15.0)
Immature Granulocytes: 0 %
Lymphocytes Relative: 29 %
Lymphs Abs: 2.7 10*3/uL (ref 0.7–4.0)
MCH: 29.5 pg (ref 26.0–34.0)
MCHC: 33.9 g/dL (ref 30.0–36.0)
MCV: 87.2 fL (ref 80.0–100.0)
Monocytes Absolute: 0.6 10*3/uL (ref 0.1–1.0)
Monocytes Relative: 7 %
Neutro Abs: 5.7 10*3/uL (ref 1.7–7.7)
Neutrophils Relative %: 62 %
Platelets: 436 10*3/uL — ABNORMAL HIGH (ref 150–400)
RBC: 4.44 MIL/uL (ref 3.87–5.11)
RDW: 13.4 % (ref 11.5–15.5)
WBC: 9.3 10*3/uL (ref 4.0–10.5)
nRBC: 0 % (ref 0.0–0.2)

## 2021-06-10 LAB — RAPID URINE DRUG SCREEN, HOSP PERFORMED
Amphetamines: NOT DETECTED
Barbiturates: NOT DETECTED
Benzodiazepines: NOT DETECTED
Cocaine: NOT DETECTED
Opiates: NOT DETECTED
Tetrahydrocannabinol: NOT DETECTED

## 2021-06-10 LAB — URINALYSIS, ROUTINE W REFLEX MICROSCOPIC
Bilirubin Urine: NEGATIVE
Glucose, UA: >=500 mg/dL — AB
Ketones, ur: 20 mg/dL — AB
Leukocytes,Ua: NEGATIVE
Nitrite: NEGATIVE
Protein, ur: >=300 mg/dL — AB
Specific Gravity, Urine: 1.022 (ref 1.005–1.030)
pH: 6 (ref 5.0–8.0)

## 2021-06-10 LAB — BASIC METABOLIC PANEL
Anion gap: 10 (ref 5–15)
BUN: 12 mg/dL (ref 6–20)
CO2: 25 mmol/L (ref 22–32)
Calcium: 9.2 mg/dL (ref 8.9–10.3)
Chloride: 102 mmol/L (ref 98–111)
Creatinine, Ser: 0.99 mg/dL (ref 0.44–1.00)
GFR, Estimated: >60 mL/min (ref >60)
Glucose, Bld: 370 mg/dL — ABNORMAL HIGH (ref 70–99)
Potassium: 4.3 mmol/L (ref 3.5–5.1)
Sodium: 137 mmol/L (ref 135–145)

## 2021-06-10 LAB — GLUCOSE, CAPILLARY
Glucose-Capillary: 341 mg/dL — ABNORMAL HIGH (ref 70–99)
Glucose-Capillary: 352 mg/dL — ABNORMAL HIGH (ref 70–99)
Glucose-Capillary: 337 mg/dL — ABNORMAL HIGH (ref 70–99)
Glucose-Capillary: 233 mg/dL — ABNORMAL HIGH (ref 70–99)

## 2021-06-10 LAB — CBG MONITORING, ED
Glucose-Capillary: 374 mg/dL — ABNORMAL HIGH (ref 70–99)
Glucose-Capillary: 390 mg/dL — ABNORMAL HIGH (ref 70–99)

## 2021-06-10 LAB — COMPREHENSIVE METABOLIC PANEL
ALT: 28 U/L (ref 0–44)
AST: 28 U/L (ref 15–41)
Albumin: 3.7 g/dL (ref 3.5–5.0)
Alkaline Phosphatase: 108 U/L (ref 38–126)
Anion gap: 13 (ref 5–15)
BUN: 15 mg/dL (ref 6–20)
CO2: 25 mmol/L (ref 22–32)
Calcium: 9.9 mg/dL (ref 8.9–10.3)
Chloride: 98 mmol/L (ref 98–111)
Creatinine, Ser: 1.22 mg/dL — ABNORMAL HIGH (ref 0.44–1.00)
GFR, Estimated: >60 mL/min (ref >60)
Glucose, Bld: 406 mg/dL — ABNORMAL HIGH (ref 70–99)
Potassium: 3.7 mmol/L (ref 3.5–5.1)
Sodium: 136 mmol/L (ref 135–145)
Total Bilirubin: 1.2 mg/dL (ref 0.3–1.2)
Total Protein: 7.9 g/dL (ref 6.5–8.1)

## 2021-06-10 LAB — BETA-HYDROXYBUTYRIC ACID: Beta-Hydroxybutyric Acid: 3.12 mmol/L — ABNORMAL HIGH (ref 0.05–0.27)

## 2021-06-10 LAB — I-STAT BETA HCG BLOOD, ED (MC, WL, AP ONLY): I-stat hCG, quantitative: <5 m[IU]/mL (ref <5)

## 2021-06-10 LAB — TROPONIN I (HIGH SENSITIVITY): Troponin I (High Sensitivity): 16 ng/L (ref <18)

## 2021-06-10 LAB — LIPASE, BLOOD: Lipase: 29 U/L (ref 11–51)

## 2021-06-10 MED ORDER — SODIUM CHLORIDE 0.9 % IV BOLUS
1000.0000 mL | Freq: Once | INTRAVENOUS | Status: AC
  Administered 2021-06-10: 1000 mL via INTRAVENOUS

## 2021-06-10 MED ORDER — DEXTROSE 50 % IV SOLN: 0.0000 mL | INTRAVENOUS | Status: DC | PRN

## 2021-06-10 MED ORDER — ONDANSETRON HCL 4 MG/2ML IJ SOLN
4.0000 mg | Freq: Once | INTRAMUSCULAR | Status: AC
  Administered 2021-06-10: 4 mg via INTRAVENOUS
  Filled 2021-06-10: qty 2

## 2021-06-10 MED ORDER — SODIUM CHLORIDE 0.9 % IV SOLN
INTRAVENOUS | Status: DC | PRN
Start: 2021-06-10 — End: 2021-06-14

## 2021-06-10 MED ORDER — PANTOPRAZOLE SODIUM 40 MG IV SOLR: 40.0000 mg | Freq: Two times a day (BID) | INTRAVENOUS | Status: DC

## 2021-06-10 MED ORDER — FAMOTIDINE IN NACL 20-0.9 MG/50ML-% IV SOLN
20.0000 mg | Freq: Once | INTRAVENOUS | Status: AC
  Administered 2021-06-10: 20 mg via INTRAVENOUS
  Filled 2021-06-10: qty 50

## 2021-06-10 MED ORDER — DIPHENHYDRAMINE HCL 50 MG/ML IJ SOLN
25.0000 mg | Freq: Four times a day (QID) | INTRAMUSCULAR | Status: DC | PRN
  Administered 2021-06-12: 25 mg via INTRAVENOUS
  Filled 2021-06-10: qty 1

## 2021-06-10 MED ORDER — METOCLOPRAMIDE HCL 5 MG/ML IJ SOLN
5.0000 mg | Freq: Once | INTRAMUSCULAR | Status: AC
  Administered 2021-06-10: 5 mg via INTRAVENOUS
  Filled 2021-06-10: qty 2

## 2021-06-10 MED ORDER — METOCLOPRAMIDE HCL 5 MG/ML IJ SOLN
5.0000 mg | Freq: Three times a day (TID) | INTRAMUSCULAR | Status: DC | PRN
  Administered 2021-06-11 – 2021-06-12 (×2): 5 mg via INTRAVENOUS
  Filled 2021-06-10 (×2): qty 2

## 2021-06-10 MED ORDER — DEXTROSE IN LACTATED RINGERS 5 % IV SOLN: INTRAVENOUS | Status: DC

## 2021-06-10 MED ORDER — HYDROMORPHONE HCL 1 MG/ML IJ SOLN
1.0000 mg | Freq: Once | INTRAMUSCULAR | Status: AC
  Administered 2021-06-10: 1 mg via INTRAVENOUS
  Filled 2021-06-10: qty 1

## 2021-06-10 MED ORDER — LABETALOL HCL 5 MG/ML IV SOLN
10.0000 mg | Freq: Once | INTRAVENOUS | Status: AC
  Administered 2021-06-10: 10 mg via INTRAVENOUS
  Filled 2021-06-10: qty 4

## 2021-06-10 MED ORDER — PANTOPRAZOLE 80MG IVPB - SIMPLE MED
80.0000 mg | Freq: Once | INTRAVENOUS | Status: AC
  Administered 2021-06-10: 80 mg via INTRAVENOUS
  Filled 2021-06-10: qty 80

## 2021-06-10 MED ORDER — HYDRALAZINE HCL 20 MG/ML IJ SOLN: 10.0000 mg | Freq: Once | INTRAMUSCULAR | Status: DC

## 2021-06-10 MED ORDER — HYDROMORPHONE HCL 1 MG/ML IJ SOLN
1.0000 mg | INTRAMUSCULAR | Status: DC | PRN
  Administered 2021-06-10: 1 mg via INTRAVENOUS
  Filled 2021-06-10: qty 1

## 2021-06-10 MED ORDER — FENTANYL CITRATE PF 50 MCG/ML IJ SOSY
25.0000 ug | PREFILLED_SYRINGE | Freq: Once | INTRAMUSCULAR | Status: AC
  Administered 2021-06-10: 25 ug via INTRAVENOUS
  Filled 2021-06-10: qty 1

## 2021-06-10 MED ORDER — DROPERIDOL 2.5 MG/ML IJ SOLN
2.5000 mg | Freq: Once | INTRAMUSCULAR | Status: AC
  Administered 2021-06-10: 2.5 mg via INTRAMUSCULAR
  Filled 2021-06-10: qty 2

## 2021-06-10 MED ORDER — ENOXAPARIN SODIUM 40 MG/0.4ML IJ SOSY
40.0000 mg | PREFILLED_SYRINGE | INTRAMUSCULAR | Status: DC
  Administered 2021-06-10 – 2021-06-13 (×4): 40 mg via SUBCUTANEOUS
  Filled 2021-06-10 (×4): qty 0.4

## 2021-06-10 MED ORDER — MORPHINE SULFATE (PF) 2 MG/ML IV SOLN
1.0000 mg | INTRAVENOUS | Status: DC | PRN
  Administered 2021-06-12 (×2): 1 mg via INTRAVENOUS
  Filled 2021-06-10 (×2): qty 1

## 2021-06-10 MED ORDER — DIPHENHYDRAMINE HCL 50 MG/ML IJ SOLN
25.0000 mg | Freq: Once | INTRAMUSCULAR | Status: AC
  Administered 2021-06-10: 25 mg via INTRAVENOUS
  Filled 2021-06-10: qty 1

## 2021-06-10 MED ORDER — ONDANSETRON HCL 4 MG/2ML IJ SOLN
4.0000 mg | Freq: Four times a day (QID) | INTRAMUSCULAR | Status: DC | PRN
  Administered 2021-06-11 – 2021-06-12 (×3): 4 mg via INTRAVENOUS
  Filled 2021-06-10 (×3): qty 2

## 2021-06-10 MED ORDER — LABETALOL HCL 5 MG/ML IV SOLN
10.0000 mg | INTRAVENOUS | Status: DC | PRN
  Administered 2021-06-11 – 2021-06-12 (×2): 10 mg via INTRAVENOUS
  Filled 2021-06-10 (×3): qty 4

## 2021-06-10 MED ORDER — HYDROMORPHONE HCL 1 MG/ML IJ SOLN
0.5000 mg | INTRAMUSCULAR | Status: DC | PRN
Start: 2021-06-10 — End: 2021-06-10
  Administered 2021-06-10: 0.5 mg via INTRAVENOUS
  Filled 2021-06-10: qty 1

## 2021-06-10 MED ORDER — INSULIN REGULAR(HUMAN) IN NACL 100-0.9 UT/100ML-% IV SOLN
INTRAVENOUS | Status: AC
  Administered 2021-06-10: 11.5 [IU]/h via INTRAVENOUS
  Filled 2021-06-10: qty 100

## 2021-06-10 MED ORDER — KETOROLAC TROMETHAMINE 15 MG/ML IJ SOLN
15.0000 mg | Freq: Once | INTRAMUSCULAR | Status: AC
  Administered 2021-06-10: 15 mg via INTRAVENOUS
  Filled 2021-06-10: qty 1

## 2021-06-10 MED ORDER — LACTATED RINGERS IV SOLN: INTRAVENOUS | Status: DC

## 2021-06-10 MED ORDER — CHLORHEXIDINE GLUCONATE CLOTH 2 % EX PADS
6.0000 | MEDICATED_PAD | Freq: Every day | CUTANEOUS | Status: DC
  Administered 2021-06-10 – 2021-06-11 (×2): 6 via TOPICAL

## 2021-06-10 NOTE — Discharge Instructions (Addendum)
It was a pleasure caring for you today in the emergency department. ? ?Please return to the emergency department for any worsening or worrisome symptoms. ? ?For Follow up with Dr Kelton Pillar: Outpatient Endocrinologist ?- Need sick day rule plan- Gastroparesis ?- Back up insulin injections in the event insulin pump malfunctions or no longer has supplies ?- Novolin N/R vs Novolin 70/30?  ?- Additional education on insulin pump? Given multiple admissions for hyperglycemia. Such as when to replace insulin pump site? Manual mode vs auto mode. ?

## 2021-06-10 NOTE — Assessment & Plan Note (Addendum)
Presents with intractable nausea and vomiting. ?-PRN Reglan 5mg  q8hr  ?-IV benadryl 25mg  q6hr PRN  ?-IV Zofran 4 mg q6hr for refractory nausea/vomiting ?-IV PPI q12hr  ?-Pt has requested GI consult- would be reasonable to consult tomorrow if no improvement in symptoms. Has seen [facility] in the past. ?

## 2021-06-10 NOTE — Assessment & Plan Note (Addendum)
Hx of uncontrolled Type 1 diabetes-last hemoglobin A1c of 9.7 on 05/28/2021 ?Early DKA. CBG of 436 with increase of anion gap from 4 to 13 from three days ago. Due to medication nonadherence with not being on insulin pump for the past 2 to 3 weeks. ?- replete potassium as needed  ?- continue insulin gtt with goal of 140-180 and AG <12 ?- IV NS until BG <250, then switch to D5 1/2 NS  ?- BMP q4hr  ?- keep NPO  ?

## 2021-06-10 NOTE — Assessment & Plan Note (Signed)
-   Creatinine elevated to 1.22 from a prior of 0.8.  Continuous IV fluid hydration.  Avoid nephrotoxic agent. ?

## 2021-06-10 NOTE — ED Notes (Signed)
Patient is actively vomiting and rates abdominal pain 10/10. MD is aware.  ?

## 2021-06-10 NOTE — Assessment & Plan Note (Signed)
Uncontrolled likely due to abdominal pain. ?PRN IV morphine and dilaudid  ?IV Labetalol with SBP >170 ?

## 2021-06-10 NOTE — ED Notes (Signed)
Call received from pt mother Wynelle Beckmann [phone] requesting rtn call for pt status/updates. ENMiles ?

## 2021-06-10 NOTE — ED Provider Notes (Signed)
?[facility] DEPT ?Provider Note ? ? ?CSN: [id number] ?Arrival date & time: 06/10/21  1351 ? ?  ? ?History ? ?Chief Complaint  ?Patient presents with  ? Emesis  ? ? ?Kelli Hudson is a 31 y.o. female. ? ? Patient as above with significant medical history as below, including gastroparesis, DM, htn, hypothyroid who presents to the ED with complaint of nausea, vomiting, abdominal pain. ? ?Patient history of gastroparesis, denies marijuana use in the past 6 weeks.  Began having nausea and vomiting this morning.  Epigastric abdominal discomfort.  Burning, cramping.  Similar episodes of gastroparesis.  She tried taking promethazine at home which did not help her symptoms.  No recent alcohol use, no illicit drug use.  No abdominal surgeries.  No fevers.  No vaginal bleeding or discharge.  No change urination or bowel function. ? ?Mx ED visits for similar in the past, recent admission for this as well. ? ?Patient is requesting Dilaudid by name.  After leaving the room patient did attempt to put her finger down her throat to induce emesis. ? ? ?Past Medical History: ?No date: Acute H. pylori gastric ulcer ?No date: Diabetes mellitus ([place]) ?No date: Diabetic gastroparesis ([place]) ?No date: Gastroparesis ?No date: GERD (gastroesophageal reflux disease) ?No date: Hypertension ?No date: Hyperthyroidism ? ?Past Surgical History: ?03/10/2018: AMPUTATION TOE; Left ?    Comment:  Procedure: AMPUTATION FIFTH TOE;  Surgeon: Jacqualyn Posey,  ?             Bonna Gains, DPM;  Location: [facility];   ?             Service: Podiatry;  Laterality: Left; ?01/28/2020: BIOPSY ?    Comment:  Procedure: BIOPSY;  Surgeon: Otis Brace, MD;   ?             Location: [facility];  Service: Gastroenterology;; ?01/28/2020: ESOPHAGOGASTRODUODENOSCOPY; N/A ?    Comment:  Procedure: ESOPHAGOGASTRODUODENOSCOPY (EGD);  Surgeon:  ?             Otis Brace, MD;  Location: [facility];  Service: ?              Gastroenterology;  Laterality: N/A; ?09/08/2015: ESOPHAGOGASTRODUODENOSCOPY (EGD) WITH PROPOFOL; Left ?    Comment:  Procedure: ESOPHAGOGASTRODUODENOSCOPY (EGD) WITH  ?             PROPOFOL;  Surgeon: Arta Silence, MD;  Location: [facility]  ?             ENDOSCOPY;  Service: Endoscopy;  Laterality: Left; ?No date: WISDOM TOOTH EXTRACTION  ? ? ?The history is provided by the patient. No language interpreter was used.  ?Emesis ?Associated symptoms: abdominal pain   ?Associated symptoms: no chills, no cough, no fever and no headaches   ? ?  ? ?Home Medications ?Prior to Admission medications   ?Medication Sig Start Date End Date Taking? Authorizing Provider  ?albuterol (VENTOLIN HFA) 108 (90 Base) MCG/ACT inhaler Inhale 2 puffs into the lungs every 6 (six) hours as needed for shortness of breath.    [provider]  ?alum & mag hydroxide-simeth (MAALOX/MYLANTA) 200-200-20 MG/5ML suspension Take 30 mLs by mouth every 6 (six) hours as needed for indigestion or heartburn. ?Patient not taking: Reported on 06/04/2021 04/24/21   Aline August, MD  ?atorvastatin (LIPITOR) 20 MG tablet Take 1 tablet (20 mg total) by mouth daily. 04/10/21   [name], MD  ?Blood Glucose Monitoring Suppl (TRUE METRIX METER) w/Device KIT 1  Device by Does not apply route 4 (four) times daily. 07/05/19   Libby Maw, MD  ?Continuous Blood Gluc Receiver (DEXCOM G6 RECEIVER) DEVI USE AS DIRECTED ?Patient taking differently: 1 each by Other route as directed. 11/21/20   [name], MD  ?Continuous Blood Gluc Sensor (DEXCOM G6 SENSOR) MISC 1 each by Does not apply route daily. 11/21/20   [name], MD  ?Continuous Blood Gluc Transmit (DEXCOM G6 TRANSMITTER) MISC 1 each by Does not apply route daily. 09/24/20   [name], MD  ?dicyclomine (BENTYL) 20 MG tablet Take 1 tablet (20 mg total) by mouth 3 (three) times daily as needed for spasms. 04/24/21   Aline August, MD  ?gabapentin (NEURONTIN) 300 MG capsule Take 1 capsule  (300 mg total) by mouth 3 (three) times daily. 04/24/21   Aline August, MD  ?glucose blood (TRUE METRIX BLOOD GLUCOSE TEST) test strip Use as instructed 09/03/20   [name], MD  ?Insulin Pen Needle 31G X 5 MM MISC 1 Device by Does not apply route QID. For use with insulin pens 07/05/19   Libby Maw, MD  ?insulin regular (NOVOLIN R) 100 units/mL injection Inject 0-0.15 mLs (0-15 Units total) into the skin See admin instructions. Via pump 04/29/21   Shamleffer, Melanie Crazier, MD  ?lisinopril (ZESTRIL) 10 MG tablet Take 1 tablet (10 mg total) by mouth daily. 09/27/20   [name], MD  ?metoCLOPramide (REGLAN) 10 MG tablet Take 1 tablet (10 mg total) by mouth 4 (four) times daily -  before meals and at bedtime. 05/30/21 06/29/21  Dessa Phi, DO  ?pantoprazole (PROTONIX) 40 MG tablet Take 1 tablet (40 mg total) by mouth 2 (two) times daily before a meal. 04/24/21   Aline August, MD  ?promethazine (PHENERGAN) 12.5 MG tablet Take 1 tablet (12.5 mg total) by mouth every 6 (six) hours as needed for nausea or vomiting. 04/24/21   Aline August, MD  ?TRUEPLUS LANCETS 28G MISC assist with checking blood sugar TID and qhs ?Patient taking differently: 1 each by Other route See admin instructions. assist with checking blood sugar TID and qhs 09/10/15   Brayton Caves, PA-C  ?insulin aspart (NOVOLOG) 100 UNIT/ML FlexPen Inject 5 Units into the skin 3 (three) times daily with meals. 02/23/18 07/05/19  Donne Hazel, MD  ?   ? ?Allergies    ?Patient has no known allergies.   ? ?Review of Systems   ?Review of Systems  ?Constitutional:  Negative for chills and fever.  ?HENT:  Negative for facial swelling and trouble swallowing.   ?Eyes:  Negative for photophobia and visual disturbance.  ?Respiratory:  Negative for cough and shortness of breath.   ?Cardiovascular:  Negative for chest pain and palpitations.  ?Gastrointestinal:  Positive for abdominal pain, nausea and vomiting.  ?Endocrine: Negative for polydipsia  and polyuria.  ?Genitourinary:  Negative for difficulty urinating and hematuria.  ?Musculoskeletal:  Negative for gait problem and joint swelling.  ?Skin:  Negative for pallor and rash.  ?Neurological:  Negative for syncope and headaches.  ?Psychiatric/Behavioral:  Negative for agitation and confusion.   ? ?Physical Exam ?Updated Vital Signs ?BP (!) 189/120   Pulse (!) 126   Temp 99.2 ?F (37.3 ?C) (Oral)   Resp 11   SpO2 100%  ?Physical Exam ?Vitals and nursing note reviewed.  ?Constitutional:   ?   General: She is not in acute distress. ?   Appearance: Normal appearance.  ?HENT:  ?  Head: Normocephalic and atraumatic.  ?   Right Ear: External ear normal.  ?   Left Ear: External ear normal.  ?   Nose: Nose normal.  ?   Mouth/Throat:  ?   Mouth: Mucous membranes are moist.  ?Eyes:  ?   General: No scleral icterus.    ?   Right eye: No discharge.     ?   Left eye: No discharge.  ?Cardiovascular:  ?   Rate and Rhythm: Normal rate and regular rhythm.  ?   Pulses: Normal pulses.  ?   Heart sounds: Normal heart sounds.  ?Pulmonary:  ?   Effort: Pulmonary effort is normal. No respiratory distress.  ?   Breath sounds: Normal breath sounds.  ?Abdominal:  ?   General: Abdomen is flat.  ?   Palpations: Abdomen is soft. There is no mass.  ?   Tenderness: There is abdominal tenderness. There is no guarding or rebound.  ?   Comments: Generalized tenderness to her abdomen.  Not peritoneal.  ?Musculoskeletal:     ?   General: Normal range of motion.  ?   Cervical back: Normal range of motion and neck supple.  ?   Right lower leg: No edema.  ?   Left lower leg: No edema.  ?Skin: ?   General: Skin is warm and dry.  ?   Capillary Refill: Capillary refill takes less than 2 seconds.  ?Neurological:  ?   Mental Status: She is alert and oriented to person, place, and time.  ?   GCS: GCS eye subscore is 4. GCS verbal subscore is 5. GCS motor subscore is 6.  ?Psychiatric:     ?   Mood and Affect: Mood normal.     ?   Behavior: Behavior  normal.  ? ? ?ED Results / Procedures / Treatments   ?Labs ?(all labs ordered are listed, but only abnormal results are displayed) ?Labs Reviewed  ?CBC WITH DIFFERENTIAL/PLATELET - Abnormal; Notable for th

## 2021-06-10 NOTE — ED Provider Notes (Signed)
?  Physical Exam  ?BP (!) 178/112   Pulse (!) 124   Temp 99.2 ?F (37.3 ?C) (Oral)   Resp 13   SpO2 100%  ? ?Physical Exam ? ?Procedures  ?Procedures ? ?ED Course / MDM  ?  ?Medical Decision Making ?Amount and/or Complexity of Data Reviewed ?Labs: ordered. ? ?Risk ?Prescription drug management. ? ? ?Received patient signout.  History of gastroparesis.  Also hyperglycemia without DKA.  Continued vomiting.  Has not been able to tolerate orals.  Continued tachycardia.  Will require admission to the hospital. ? ? ? ? ?  ?Benjiman Core, MD ?06/10/21 1857 ? ?

## 2021-06-10 NOTE — H&P (Signed)
?History and Physical  ? ? ?Patient: Kelli Hudson PJS:[id number] DOB: 10/18/1990 ?DOA: 06/10/2021 ?DOS: the patient was seen and examined on 06/10/2021 ?PCP: [name], MD  ?Patient coming from: Home ? ?Chief Complaint:  ?Chief Complaint  ?Patient presents with  ? Emesis  ? ?HPI: Kelli Hudson is a 31 y.o. female with medical history significant of uncontrolled type 1 diabetes with gastroparesis, hypertension, GERD, hyperlipidemia, hypothyroidism, depression and anxiety who presents with persistent nausea and vomiting. ? ?Patient reports acute onset of nausea, vomiting and midsternal chest pain today.  States pain is a burning sensation.  She has been without her insulin pump for least 2 to 3 weeks due to connection problems.  She was last admitted for similar symptoms from 4/18-4/21 and 4/11-4/13.  She has seen  GI in the past and requested to see them during this admission. ? ?In the ED, she had temperature of 99.2 F, tachycardic up to heart rate of 144, hypertensive up to SBP of 190 on room air.  No leukocytosis or anemia.  Platelets of 436. ?Sodium 136, K of 3.7, glucose elevated to 406 with anion gap that has risen to 13 from 4 just a few days ago. Creatinine elevated at 1.22 from 0.83.  ?Troponin of 16. ?UA with negative leukocyte, negative nitrite, greater than 300 protein, greater than 5 tract glucose with 20+ ketones.  UDS negative. ? ?She was given 2 L of normal saline bolus, IV Protonix at 80 mg, IV Zofran 4 mg, IV Reglan 5 mg, 50 mg of ketorolac, 1 mg of IV Dilaudid, droperidol, famotidine, Benadryl and continue to have persistent symptoms. ? ?Hospitalist on-call for further management of persistent nausea and vomiting with gastroparesis. ? ?Review of Systems: As mentioned in the history of present illness. All other systems reviewed and are negative. ?Past Medical History:  ?Diagnosis Date  ? Acute H. pylori gastric ulcer   ? Diabetes mellitus ([place])   ? Diabetic gastroparesis  ([place])   ? Gastroparesis   ? GERD (gastroesophageal reflux disease)   ? Hypertension   ? Hyperthyroidism   ? ?Past Surgical History:  ?Procedure Laterality Date  ? AMPUTATION TOE Left 03/10/2018  ? Procedure: AMPUTATION FIFTH TOE;  Surgeon: Trula Slade, DPM;  Location: [facility];  Service: Podiatry;  Laterality: Left;  ? BIOPSY  01/28/2020  ? Procedure: BIOPSY;  Surgeon: Otis Brace, MD;  Location: [facility];  Service: Gastroenterology;;  ? ESOPHAGOGASTRODUODENOSCOPY N/A 01/28/2020  ? Procedure: ESOPHAGOGASTRODUODENOSCOPY (EGD);  Surgeon: Otis Brace, MD;  Location: [facility];  Service: Gastroenterology;  Laterality: N/A;  ? ESOPHAGOGASTRODUODENOSCOPY (EGD) WITH PROPOFOL Left 09/08/2015  ? Procedure: ESOPHAGOGASTRODUODENOSCOPY (EGD) WITH PROPOFOL;  Surgeon: Arta Silence, MD;  Location: [facility] ENDOSCOPY;  Service: Endoscopy;  Laterality: Left;  ? WISDOM TOOTH EXTRACTION    ? ?Social History:  reports that she has never smoked. She has never used smokeless tobacco. She reports current alcohol use of about 1.0 standard drink per week. She reports that she does not use drugs. ? ?No Known Allergies ? ?Family History  ?Problem Relation Age of Onset  ? Lung cancer Mother   ? Diabetes Mother   ? Bipolar disorder Father   ? Liver cancer Maternal Grandfather   ? Breast cancer Other   ?     maternal great aunt  ? Heart disease Other   ? Ovarian cancer Other   ?     maternal great aunt  ? Pancreatic cancer Maternal Uncle   ? Prostate cancer  Maternal Uncle   ? Diabetes Maternal Aunt   ?     x 2  ? Kidney disease Other   ?     maternal great aunt  ? ? ?Prior to Admission medications   ?Medication Sig Start Date End Date Taking? Authorizing Provider  ?albuterol (VENTOLIN HFA) 108 (90 Base) MCG/ACT inhaler Inhale 2 puffs into the lungs every 6 (six) hours as needed for shortness of breath.    [provider]  ?alum & mag hydroxide-simeth (MAALOX/MYLANTA) 200-200-20 MG/5ML suspension Take  30 mLs by mouth every 6 (six) hours as needed for indigestion or heartburn. ?Patient not taking: Reported on 06/04/2021 04/24/21   Aline August, MD  ?atorvastatin (LIPITOR) 20 MG tablet Take 1 tablet (20 mg total) by mouth daily. 04/10/21   [name], MD  ?Blood Glucose Monitoring Suppl (TRUE METRIX METER) w/Device KIT 1 Device by Does not apply route 4 (four) times daily. 07/05/19   Libby Maw, MD  ?Continuous Blood Gluc Receiver (DEXCOM G6 RECEIVER) DEVI USE AS DIRECTED ?Patient taking differently: 1 each by Other route as directed. 11/21/20   [name], MD  ?Continuous Blood Gluc Sensor (DEXCOM G6 SENSOR) MISC 1 each by Does not apply route daily. 11/21/20   [name], MD  ?Continuous Blood Gluc Transmit (DEXCOM G6 TRANSMITTER) MISC 1 each by Does not apply route daily. 09/24/20   [name], MD  ?dicyclomine (BENTYL) 20 MG tablet Take 1 tablet (20 mg total) by mouth 3 (three) times daily as needed for spasms. 04/24/21   Aline August, MD  ?gabapentin (NEURONTIN) 300 MG capsule Take 1 capsule (300 mg total) by mouth 3 (three) times daily. 04/24/21   Aline August, MD  ?glucose blood (TRUE METRIX BLOOD GLUCOSE TEST) test strip Use as instructed 09/03/20   [name], MD  ?Insulin Pen Needle 31G X 5 MM MISC 1 Device by Does not apply route QID. For use with insulin pens 07/05/19   Libby Maw, MD  ?insulin regular (NOVOLIN R) 100 units/mL injection Inject 0-0.15 mLs (0-15 Units total) into the skin See admin instructions. Via pump 04/29/21   Shamleffer, Melanie Crazier, MD  ?lisinopril (ZESTRIL) 10 MG tablet Take 1 tablet (10 mg total) by mouth daily. 09/27/20   [name], MD  ?metoCLOPramide (REGLAN) 10 MG tablet Take 1 tablet (10 mg total) by mouth 4 (four) times daily -  before meals and at bedtime. 05/30/21 06/29/21  Dessa Phi, DO  ?pantoprazole (PROTONIX) 40 MG tablet Take 1 tablet (40 mg total) by mouth 2 (two) times daily before a meal. 04/24/21   Aline August,  MD  ?promethazine (PHENERGAN) 12.5 MG tablet Take 1 tablet (12.5 mg total) by mouth every 6 (six) hours as needed for nausea or vomiting. 04/24/21   Aline August, MD  ?TRUEPLUS LANCETS 28G MISC assist with checking blood sugar TID and qhs ?Patient taking differently: 1 each by Other route See admin instructions. assist with checking blood sugar TID and qhs 09/10/15   Brayton Caves, PA-C  ?insulin aspart (NOVOLOG) 100 UNIT/ML FlexPen Inject 5 Units into the skin 3 (three) times daily with meals. 02/23/18 07/05/19  Donne Hazel, MD  ? ? ?Physical Exam: ?Vitals:  ? 06/10/21 2035 06/10/21 2100 06/10/21 2130 06/10/21 2200  ?BP: (!) 209/142 (!) 197/111 (!) 151/83 137/72  ?Pulse:  (!) 126    ?Resp: 16 (!) 9 10 14   ?Temp:      ?TempSrc:      ?  SpO2:  100%    ?Weight:      ?Height:      ? ?Constitutional: NAD, uncomfortable young female laying on her side in bed ?Eyes: lids and conjunctivae normal ?ENMT: Mucous membranes are moist.  ?Neck: normal, supple,  ?Respiratory: clear to auscultation bilaterally, no wheezing, no crackles. Normal respiratory effort.  ?Cardiovascular: Regular rate and rhythm, no murmurs / rubs / gallops. No extremity edema.  ?Abdomen: Soft, nondistended, diffuse moderate tenderness.  No rebound, guarding or rigidity.  Bowel sounds positive.  ?Musculoskeletal: no clubbing / cyanosis. No joint deformity upper and lower extremities. Good ROM, no contractures. Normal muscle tone.  ?Skin: no rashes, lesions, ulcers.  ?Neurologic: CN 2-12 grossly intact. Strength 5/5 in all 4.  ?Psychiatric: Normal judgment and insight. Alert and oriented x 3. Normal mood. ?Data Reviewed: ? ?See HPI ? ?Assessment and Plan: ?* Hyperglycemia ?Hx of uncontrolled Type 1 diabetes-last hemoglobin A1c of 9.7 on 05/28/2021 ?Early DKA. CBG of 436 with increase of anion gap from 4 to 13 from three days ago. Due to medication nonadherence with not being on insulin pump for the past 2 to 3 weeks. ?- replete potassium as needed  ?-  continue insulin gtt with goal of 140-180 and AG <12 ?- IV NS until BG <250, then switch to D5 1/2 NS  ?- BMP q4hr  ?- keep NPO  ? ?Diabetic gastroparesis associated with type 1 diabetes mellitus ([place]) ?Pr

## 2021-06-10 NOTE — ED Triage Notes (Signed)
Pt arrived via POV, c/o n/v since this morning. Hx of gastroparesis.  ?

## 2021-06-11 ENCOUNTER — Other Ambulatory Visit: Payer: Self-pay

## 2021-06-11 DIAGNOSIS — K219 Gastro-esophageal reflux disease without esophagitis: Secondary | ICD-10-CM | POA: Diagnosis present

## 2021-06-11 DIAGNOSIS — E1043 Type 1 diabetes mellitus with diabetic autonomic (poly)neuropathy: Secondary | ICD-10-CM | POA: Diagnosis present

## 2021-06-11 DIAGNOSIS — E1165 Type 2 diabetes mellitus with hyperglycemia: Secondary | ICD-10-CM | POA: Diagnosis present

## 2021-06-11 DIAGNOSIS — E1065 Type 1 diabetes mellitus with hyperglycemia: Secondary | ICD-10-CM | POA: Diagnosis present

## 2021-06-11 DIAGNOSIS — Z818 Family history of other mental and behavioral disorders: Secondary | ICD-10-CM | POA: Diagnosis not present

## 2021-06-11 DIAGNOSIS — E039 Hypothyroidism, unspecified: Secondary | ICD-10-CM | POA: Diagnosis present

## 2021-06-11 DIAGNOSIS — Z833 Family history of diabetes mellitus: Secondary | ICD-10-CM | POA: Diagnosis not present

## 2021-06-11 DIAGNOSIS — Z91148 Patient's other noncompliance with medication regimen for other reason: Secondary | ICD-10-CM | POA: Diagnosis not present

## 2021-06-11 DIAGNOSIS — Z8249 Family history of ischemic heart disease and other diseases of the circulatory system: Secondary | ICD-10-CM | POA: Diagnosis not present

## 2021-06-11 DIAGNOSIS — Z79899 Other long term (current) drug therapy: Secondary | ICD-10-CM | POA: Diagnosis not present

## 2021-06-11 DIAGNOSIS — Z89422 Acquired absence of other left toe(s): Secondary | ICD-10-CM | POA: Diagnosis not present

## 2021-06-11 DIAGNOSIS — E1069 Type 1 diabetes mellitus with other specified complication: Secondary | ICD-10-CM | POA: Diagnosis present

## 2021-06-11 DIAGNOSIS — E785 Hyperlipidemia, unspecified: Secondary | ICD-10-CM | POA: Diagnosis present

## 2021-06-11 DIAGNOSIS — F32A Depression, unspecified: Secondary | ICD-10-CM | POA: Diagnosis present

## 2021-06-11 DIAGNOSIS — R739 Hyperglycemia, unspecified: Secondary | ICD-10-CM | POA: Diagnosis not present

## 2021-06-11 DIAGNOSIS — N179 Acute kidney failure, unspecified: Secondary | ICD-10-CM | POA: Diagnosis present

## 2021-06-11 DIAGNOSIS — F419 Anxiety disorder, unspecified: Secondary | ICD-10-CM | POA: Diagnosis present

## 2021-06-11 DIAGNOSIS — Z794 Long term (current) use of insulin: Secondary | ICD-10-CM | POA: Diagnosis not present

## 2021-06-11 DIAGNOSIS — I152 Hypertension secondary to endocrine disorders: Secondary | ICD-10-CM | POA: Diagnosis present

## 2021-06-11 DIAGNOSIS — K3184 Gastroparesis: Secondary | ICD-10-CM | POA: Diagnosis present

## 2021-06-11 LAB — GLUCOSE, CAPILLARY
Glucose-Capillary: 153 mg/dL — ABNORMAL HIGH (ref 70–99)
Glucose-Capillary: 167 mg/dL — ABNORMAL HIGH (ref 70–99)
Glucose-Capillary: 171 mg/dL — ABNORMAL HIGH (ref 70–99)
Glucose-Capillary: 175 mg/dL — ABNORMAL HIGH (ref 70–99)
Glucose-Capillary: 179 mg/dL — ABNORMAL HIGH (ref 70–99)
Glucose-Capillary: 180 mg/dL — ABNORMAL HIGH (ref 70–99)
Glucose-Capillary: 186 mg/dL — ABNORMAL HIGH (ref 70–99)
Glucose-Capillary: 190 mg/dL — ABNORMAL HIGH (ref 70–99)
Glucose-Capillary: 194 mg/dL — ABNORMAL HIGH (ref 70–99)
Glucose-Capillary: 197 mg/dL — ABNORMAL HIGH (ref 70–99)
Glucose-Capillary: 235 mg/dL — ABNORMAL HIGH (ref 70–99)

## 2021-06-11 LAB — BASIC METABOLIC PANEL
Anion gap: 7 (ref 5–15)
Anion gap: 9 (ref 5–15)
Anion gap: 8 (ref 5–15)
Anion gap: 12 (ref 5–15)
Anion gap: 8 (ref 5–15)
Anion gap: 7 (ref 5–15)
BUN: 17 mg/dL (ref 6–20)
BUN: 15 mg/dL (ref 6–20)
BUN: 15 mg/dL (ref 6–20)
BUN: 16 mg/dL (ref 6–20)
BUN: 16 mg/dL (ref 6–20)
BUN: 16 mg/dL (ref 6–20)
CO2: 23 mmol/L (ref 22–32)
CO2: 26 mmol/L (ref 22–32)
CO2: 26 mmol/L (ref 22–32)
CO2: 24 mmol/L (ref 22–32)
CO2: 24 mmol/L (ref 22–32)
CO2: 28 mmol/L (ref 22–32)
Calcium: 8.8 mg/dL — ABNORMAL LOW (ref 8.9–10.3)
Calcium: 9.5 mg/dL (ref 8.9–10.3)
Calcium: 9.6 mg/dL (ref 8.9–10.3)
Calcium: 9.3 mg/dL (ref 8.9–10.3)
Calcium: 9.2 mg/dL (ref 8.9–10.3)
Calcium: 9.2 mg/dL (ref 8.9–10.3)
Chloride: 110 mmol/L (ref 98–111)
Chloride: 104 mmol/L (ref 98–111)
Chloride: 106 mmol/L (ref 98–111)
Chloride: 104 mmol/L (ref 98–111)
Chloride: 107 mmol/L (ref 98–111)
Chloride: 105 mmol/L (ref 98–111)
Creatinine, Ser: 1.26 mg/dL — ABNORMAL HIGH (ref 0.44–1.00)
Creatinine, Ser: 1.09 mg/dL — ABNORMAL HIGH (ref 0.44–1.00)
Creatinine, Ser: 0.93 mg/dL (ref 0.44–1.00)
Creatinine, Ser: 1.16 mg/dL — ABNORMAL HIGH (ref 0.44–1.00)
Creatinine, Ser: 1.08 mg/dL — ABNORMAL HIGH (ref 0.44–1.00)
Creatinine, Ser: 1.11 mg/dL — ABNORMAL HIGH (ref 0.44–1.00)
GFR, Estimated: 59 mL/min — ABNORMAL LOW (ref >60)
GFR, Estimated: >60 mL/min (ref >60)
GFR, Estimated: >60 mL/min (ref >60)
GFR, Estimated: >60 mL/min (ref >60)
GFR, Estimated: >60 mL/min (ref >60)
GFR, Estimated: >60 mL/min (ref >60)
Glucose, Bld: 175 mg/dL — ABNORMAL HIGH (ref 70–99)
Glucose, Bld: 158 mg/dL — ABNORMAL HIGH (ref 70–99)
Glucose, Bld: 209 mg/dL — ABNORMAL HIGH (ref 70–99)
Glucose, Bld: 238 mg/dL — ABNORMAL HIGH (ref 70–99)
Glucose, Bld: 238 mg/dL — ABNORMAL HIGH (ref 70–99)
Glucose, Bld: 194 mg/dL — ABNORMAL HIGH (ref 70–99)
Potassium: 3.9 mmol/L (ref 3.5–5.1)
Potassium: 3.6 mmol/L (ref 3.5–5.1)
Potassium: 4.4 mmol/L (ref 3.5–5.1)
Potassium: 4.8 mmol/L (ref 3.5–5.1)
Potassium: 4.5 mmol/L (ref 3.5–5.1)
Potassium: 3.7 mmol/L (ref 3.5–5.1)
Sodium: 140 mmol/L (ref 135–145)
Sodium: 139 mmol/L (ref 135–145)
Sodium: 140 mmol/L (ref 135–145)
Sodium: 140 mmol/L (ref 135–145)
Sodium: 139 mmol/L (ref 135–145)
Sodium: 140 mmol/L (ref 135–145)

## 2021-06-11 LAB — BETA-HYDROXYBUTYRIC ACID: Beta-Hydroxybutyric Acid: 0.14 mmol/L (ref 0.05–0.27)

## 2021-06-11 LAB — MRSA NEXT GEN BY PCR, NASAL: MRSA by PCR Next Gen: NEGATIVE — AB

## 2021-06-11 MED ORDER — SODIUM CHLORIDE 0.9 % IV BOLUS
500.0000 mL | Freq: Once | INTRAVENOUS | Status: AC
  Administered 2021-06-11: 500 mL via INTRAVENOUS

## 2021-06-11 MED ORDER — ATORVASTATIN CALCIUM 20 MG PO TABS
20.0000 mg | ORAL_TABLET | Freq: Every day | ORAL | Status: DC
Start: 2021-06-11 — End: 2021-06-14
  Administered 2021-06-11 – 2021-06-14 (×4): 20 mg via ORAL
  Filled 2021-06-11: qty 1
  Filled 2021-06-11: qty 2
  Filled 2021-06-11 (×2): qty 1

## 2021-06-11 MED ORDER — LISINOPRIL 10 MG PO TABS
10.0000 mg | ORAL_TABLET | Freq: Every day | ORAL | Status: DC
  Administered 2021-06-11 – 2021-06-14 (×4): 10 mg via ORAL
  Filled 2021-06-11 (×4): qty 1

## 2021-06-11 MED ORDER — HYDROMORPHONE HCL 1 MG/ML IJ SOLN
0.5000 mg | INTRAMUSCULAR | Status: DC | PRN
  Administered 2021-06-11 – 2021-06-14 (×19): 0.5 mg via INTRAVENOUS
  Filled 2021-06-11: qty 1
  Filled 2021-06-11 (×4): qty 0.5
  Filled 2021-06-11: qty 1
  Filled 2021-06-11 (×5): qty 0.5
  Filled 2021-06-11: qty 1
  Filled 2021-06-11 (×4): qty 0.5
  Filled 2021-06-11: qty 1
  Filled 2021-06-11 (×2): qty 0.5

## 2021-06-11 MED ORDER — SODIUM CHLORIDE 0.9 % IV SOLN: INTRAVENOUS | Status: AC

## 2021-06-11 MED ORDER — INSULIN ASPART 100 UNIT/ML IJ SOLN
0.0000 [IU] | INTRAMUSCULAR | Status: DC
  Administered 2021-06-11 (×2): 3 [IU] via SUBCUTANEOUS
  Administered 2021-06-11: 5 [IU] via SUBCUTANEOUS
  Administered 2021-06-12 (×2): 2 [IU] via SUBCUTANEOUS
  Administered 2021-06-12 (×3): 3 [IU] via SUBCUTANEOUS
  Administered 2021-06-12: 2 [IU] via SUBCUTANEOUS

## 2021-06-11 MED ORDER — INSULIN ASPART 100 UNIT/ML IJ SOLN: 0.0000 [IU] | Freq: Three times a day (TID) | INTRAMUSCULAR | Status: DC

## 2021-06-11 MED ORDER — INSULIN DETEMIR 100 UNIT/ML ~~LOC~~ SOLN
15.0000 [IU] | Freq: Two times a day (BID) | SUBCUTANEOUS | Status: DC
  Administered 2021-06-11 – 2021-06-13 (×4): 15 [IU] via SUBCUTANEOUS
  Filled 2021-06-11 (×6): qty 0.15

## 2021-06-11 MED ORDER — METOCLOPRAMIDE HCL 5 MG PO TABS: 10.0000 mg | ORAL_TABLET | Freq: Three times a day (TID) | ORAL | Status: DC

## 2021-06-11 MED ORDER — PANTOPRAZOLE SODIUM 40 MG IV SOLR
40.0000 mg | Freq: Every day | INTRAVENOUS | Status: DC
  Administered 2021-06-11 – 2021-06-14 (×4): 40 mg via INTRAVENOUS
  Filled 2021-06-11 (×4): qty 10

## 2021-06-11 MED ORDER — INSULIN DETEMIR 100 UNIT/ML ~~LOC~~ SOLN
10.0000 [IU] | Freq: Two times a day (BID) | SUBCUTANEOUS | Status: DC
  Administered 2021-06-11: 10 [IU] via SUBCUTANEOUS
  Filled 2021-06-11 (×2): qty 0.1

## 2021-06-11 MED ORDER — INSULIN ASPART 100 UNIT/ML IJ SOLN: 0.0000 [IU] | Freq: Every day | INTRAMUSCULAR | Status: DC

## 2021-06-11 MED ORDER — SODIUM CHLORIDE 0.9 % IV BOLUS
250.0000 mL | Freq: Once | INTRAVENOUS | Status: AC
Start: 2021-06-11 — End: 2021-06-11
  Administered 2021-06-11: 250 mL via INTRAVENOUS

## 2021-06-11 MED ORDER — GABAPENTIN 300 MG PO CAPS
300.0000 mg | ORAL_CAPSULE | Freq: Three times a day (TID) | ORAL | Status: DC
  Administered 2021-06-11 – 2021-06-14 (×7): 300 mg via ORAL
  Filled 2021-06-11 (×9): qty 1

## 2021-06-11 NOTE — Progress Notes (Addendum)
Inpatient Diabetes Program Recommendations ? ?AACE/ADA: New Consensus Statement on Inpatient Glycemic Control (2015) ? ?Target Ranges:  Prepandial:   less than 140 mg/dL ?     Peak postprandial:   less than 180 mg/dL (1-2 hours) ?     Critically ill patients:  140 - 180 mg/dL  ? ?Lab Results  ?Component Value Date  ? GLUCAP 153 (H) 06/11/2021  ? HGBA1C 9.7 (H) 05/28/2021  ? ? ?Review of Glycemic Control ? Latest Reference Range & Units 06/11/21 03:31 06/11/21 04:37 06/11/21 06:42 06/11/21 08:36  ?Glucose-Capillary 70 - 99 mg/dL 355 (H) 732 (H) 202 (H) 153 (H)  ?(H): Data is abnormally high ?Diabetes history: Type 1 Dm ?Outpatient Diabetes medications: Tandem insulin pump with Novolin R  ?Pump settings: ?Basal - 0.5/H ?Bolus CHO ratio 1:12 ?CF 50 with goal of 110 mg/dL ?Current orders for Inpatient glycemic control: IV insulin to transition to: Levemir 10 units BID, Novolog 0-15 units Q4H ? ?Inpatient Diabetes Program Recommendations:   ? ?Spoke with patient regarding outpatient diabetes management. Patient has been using insulin pump but noticed once emesis began her blood sugars were increasing. Patient self administered Novolin R 4 units x 1 SQ and felt this helped slightly and noticed blood sugar decreased, however, did not continue with pump bolusing nor additional injections.  ?Does not have insulin pump or supplies available for reapplication. Patient reports that she no longer has additional supplies at home and that the insurance company is sending a 90 day supply, as this was a prior problem during previous admission. Has scheduled appointment coming up with Dr [facility] outpatient endocrinology.  ? ?Discussed importance of having sick day rule plan with Dr Lonzo Cloud and injections available in the event insulin pump supplies do not arrive or if the insulin pump malfunctions. Reviewed when to replace insulin pump site. Patient did not know if the pump was in manual mode vs auto, as T-slim should override.  Patient reports wearing Dexcom. Attached list of questions to address at follow up to discharge summary. ?No further questions at this time.  ? ? ?Thanks, ?Lujean Rave, MSN, RNC-OB ?Diabetes Coordinator ?[phone] ([id number]) ? ? ? ?

## 2021-06-11 NOTE — Progress Notes (Addendum)
[facility] PROGRESS NOTE    Progress Note  Kelli Hudson  ONG:[id number] DOB: 08/28/1990 DOA: 06/10/2021 PCP: Loyola Mast, MD     Brief Narrative:   Kelli Hudson is an 31 y.o. female past medical history of uncontrolled hypertension, diabetes mellitus type 2 with gastroparesis GERD depression anxiety comes in with persistent nausea and vomiting    Assessment/Plan:   Diabetes mellitus type 1 with Hyperglycemia hyperosmolar nonketotic state Likely due to medication nonadherence. She was started on an insulin drip and started on fluid resuscitation. Blood glucose this morning is 167 on insulin drip we will go ahead and transition her to a long-acting insulin plus sliding scale.  Diabetic gastroparesis associated with diabetes mellitus type 1: Her main presentation was nausea and vomiting which was intractable continue Benadryl and Reglan. Add Zofran for breakthrough. Continue IV Protonix daily.  Hypertension associated with diabetes: Uncontrolled resume home meds she was probably not taking it due to intractable nausea vomiting. Her Bp is worst likely due to abdominal pain. She was started on IV morphine. Blood pressure is improved this morning is 115/75.  Acute kidney injury: With a baseline creatinine 0.8 ACE inhibitor was held was started on IV fluids her creatinine has returned close to baseline. Continue IV fluids we will start ACE inhibitor.    DVT prophylaxis: lovenox Family Communication:none Status is: Observation The patient will require care spanning > 2 midnights and should be moved to inpatient because: Hyperglycemic hyperosmolar nonketotic state    Code Status:     Code Status Orders  (From admission, onward)           Start     Ordered   06/10/21 2104  Full code  Continuous        06/10/21 2104           Code Status History     Date Active Date Inactive Code Status Order ID Comments User Context   06/04/2021  1715 06/07/2021 1605 Full Code [id number]  Calvert Cantor, MD ED   05/28/2021 1111 05/30/2021 1549 Full Code [id number]  Teddy Spike, DO ED   05/28/2021 0341 05/28/2021 1111 Full Code [id number]  Howerter, Chaney Born, DO ED   04/20/2021 2151 04/24/2021 2104 Full Code [id number]  Charlsie Quest, MD ED   02/24/2021 2320 02/28/2021 2215 Full Code [id number]  Teddy Spike, DO Inpatient   02/24/2021 1737 02/24/2021 2320 Full Code [id number]  Teddy Spike, DO ED   11/16/2020 0424 11/17/2020 1428 Full Code [id number]  Hillary Bow, DO ED   09/08/2020 2328 09/11/2020 1951 Full Code [id number]  Hillary Bow, DO ED   08/12/2020 2304 08/15/2020 1517 Full Code [id number]  Hillary Bow, DO ED   07/25/2020 1810 07/28/2020 1545 Full Code [id number]  Margie Ege A, DO Inpatient   07/23/2020 2038 07/24/2020 1519 Full Code [id number]  Anselm Jungling, DO ED   06/05/2020 2349 06/09/2020 2026 Full Code [id number]  Hugelmeyer, Alexis, DO ED   05/27/2020 1913 05/28/2020 2248 Full Code [id number]  Merlene Laughter, DO Inpatient   05/09/2020 2149 05/14/2020 2059 Full Code [id number]  Chotiner, Claudean Severance, MD ED   04/04/2020 1214 04/06/2020 1934 Full Code [id number]  Teddy Spike, DO ED   02/24/2020 1003 02/27/2020 1622 Full Code [id number]  Teddy Spike, DO ED   01/25/2020 0113 01/30/2020 1637 Full Code [id number]  Briscoe Deutscher, MD ED   02/21/2018 1904 02/23/2018 2052 Full Code [id number]  Park, Bartholome Bill, MD ED  02/21/2018 1833 02/21/2018 1904 Full Code [id number]  Ike Bene, MD ED   01/02/2016 0842 01/05/2016 1438 Full Code [id number]  Lora Paula, MD Inpatient   09/02/2015 1729 09/09/2015 2148 Full Code [id number]  Meredith Pel, NP ED         IV Access:   Peripheral IV   Procedures and diagnostic studies:   No results found.   Medical Consultants:   None.   Subjective:    Kelli Hudson relates her nausea is improved a little bit this morning.  Objective:    Vitals:   06/11/21 0530 06/11/21 0600 06/11/21 0615  06/11/21 0630  BP: (!) 190/119 (!) 169/106 (!) 194/121 (!) 183/135  Pulse:      Resp: 11 (!) 24 15 13   Temp:      TempSrc:      SpO2:      Weight:      Height:       SpO2: 100 %   Intake/Output Summary (Last 24 hours) at 06/11/2021 0721 Last data filed at 06/11/2021 0700 Gross per 24 hour  Intake 2450.07 ml  Output 1825 ml  Net 625.07 ml   Filed Weights   06/10/21 2032  Weight: 81.3 kg    Exam: General exam: In no acute distress. Respiratory system: Good air movement and clear to auscultation. Cardiovascular system: S1 & S2 heard, RRR.  Gastrointestinal system: Abdomen is nondistended, soft and nontender.  Extremities: No pedal edema. Skin: No rashes, lesions or ulcers Psychiatry: Judgement and insight appear normal. Mood & affect appropriate.    Data Reviewed:    Labs: Basic Metabolic Panel: Recent Labs  Lab 06/06/21 0418 06/07/21 0413 06/10/21 1411 06/10/21 1928 06/11/21 0116 06/11/21 0623  NA 137 139 136 137 140 139  K 3.8 3.8 3.7 4.3 3.9 3.6  CL 108 109 98 102 110 104  CO2 24 26 25 25 23 26   GLUCOSE 194* 203* 406* 370* 175* 158*  BUN 12 9 15 12 17 15   CREATININE 0.86 0.83 1.22* 0.99 1.26* 1.09*  CALCIUM 8.9 8.5* 9.9 9.2 8.8* 9.5  MG 1.5* 1.8  --   --   --   --    GFR Estimated Creatinine Clearance: 84.5 mL/min (A) (by C-G formula based on SCr of 1.09 mg/dL (H)). Liver Function Tests: Recent Labs  Lab 06/04/21 1030 06/10/21 1411  AST 25 28  ALT 24 28  ALKPHOS 81 108  BILITOT 0.7 1.2  PROT 7.5 7.9  ALBUMIN 3.4* 3.7   Recent Labs  Lab 06/04/21 1030 06/10/21 1411  LIPASE 25 29   No results for input(s): AMMONIA in the last 168 hours. Coagulation profile No results for input(s): INR, PROTIME in the last 168 hours. COVID-19 Labs  No results for input(s): DDIMER, FERRITIN, LDH, CRP in the last 72 hours.  Lab Results  Component Value Date   SARSCOV2NAA NEGATIVE 04/20/2021   SARSCOV2NAA NEGATIVE 02/24/2021   SARSCOV2NAA NEGATIVE  11/16/2020   SARSCOV2NAA NEGATIVE 09/09/2020    CBC: Recent Labs  Lab 06/04/21 1030 06/05/21 0459 06/06/21 0418 06/10/21 1411  WBC 16.2* 14.0* 9.7 9.3  NEUTROABS  --   --  3.2 5.7  HGB 12.1 8.9* 10.3* 13.1  HCT 36.3 27.2* 31.9* 38.7  MCV 87.9 89.8 90.6 87.2  PLT 423* 278 302 436*   Cardiac Enzymes: No results for input(s): CKTOTAL, CKMB, CKMBINDEX, TROPONINI in the last 168 hours. BNP (last 3 results) No results for input(s): PROBNP in the last 8760 hours.  CBG: Recent Labs  Lab 06/11/21 0128 06/11/21 0229 06/11/21 0331 06/11/21 0437 06/11/21 0642  GLUCAP 186* 171* 175* 180* 167*   D-Dimer: No results for input(s): DDIMER in the last 72 hours. Hgb A1c: No results for input(s): HGBA1C in the last 72 hours. Lipid Profile: No results for input(s): CHOL, HDL, LDLCALC, TRIG, CHOLHDL, LDLDIRECT in the last 72 hours. Thyroid function studies: No results for input(s): TSH, T4TOTAL, T3FREE, THYROIDAB in the last 72 hours.  Invalid input(s): FREET3 Anemia work up: No results for input(s): VITAMINB12, FOLATE, FERRITIN, TIBC, IRON, RETICCTPCT in the last 72 hours. Sepsis Labs: Recent Labs  Lab 06/04/21 1030 06/05/21 0459 06/06/21 0418 06/10/21 1411  WBC 16.2* 14.0* 9.7 9.3   Microbiology Recent Results (from the past 240 hour(s))  MRSA Next Gen by PCR, Nasal     Status: Abnormal   Collection Time: 06/10/21  8:30 PM   Specimen: Nasal Mucosa; Nasal Swab  Result Value Ref Range Status   MRSA by PCR Next Gen NEGATIVE (A) NOT DETECTED Final    Comment:        The GeneXpert MRSA Assay (FDA approved for NASAL specimens only), is one component of a comprehensive MRSA colonization surveillance program. It is not intended to diagnose MRSA infection nor to guide or monitor treatment for MRSA infections. Performed at [facility], [address] [place], [place] [address]      Medications:    Chlorhexidine Gluconate Cloth  6 each Topical Daily    enoxaparin (LOVENOX) injection  40 mg Subcutaneous Q24H   pantoprazole (PROTONIX) IV  40 mg Intravenous Q12H   Continuous Infusions:  sodium chloride 10 mL/hr at 06/11/21 0700   dextrose 5% lactated ringers 125 mL/hr at 06/11/21 0700   insulin 3 Units/hr (06/11/21 0700)   lactated ringers Stopped (06/10/21 2333)      LOS: 0 days   Marinda Elk  [facility]  06/11/2021, 7:21 AM

## 2021-06-11 NOTE — Progress Notes (Signed)
?  Transition of Care (TOC) Screening Note ? ? ?Patient Details  ?Name: Kelli Hudson ?Date of Birth: May 14, 1990 ? ? ?Transition of Care [facility]) CM/SW Contact:    ?Dessa Phi, RN ?Phone Number: ?06/11/2021, 1:01 PM ? ? ? ?Transition of Care Department [facility]) has reviewed patient and no TOC needs have been identified at this time. We will continue to monitor patient advancement through interdisciplinary progression rounds. If new patient transition needs arise, please place a TOC consult. ?  ?

## 2021-06-12 ENCOUNTER — Telehealth: Payer: 59

## 2021-06-12 DIAGNOSIS — R739 Hyperglycemia, unspecified: Secondary | ICD-10-CM | POA: Diagnosis not present

## 2021-06-12 LAB — GLUCOSE, CAPILLARY
Glucose-Capillary: 143 mg/dL — ABNORMAL HIGH (ref 70–99)
Glucose-Capillary: 170 mg/dL — ABNORMAL HIGH (ref 70–99)
Glucose-Capillary: 140 mg/dL — ABNORMAL HIGH (ref 70–99)
Glucose-Capillary: 136 mg/dL — ABNORMAL HIGH (ref 70–99)
Glucose-Capillary: 168 mg/dL — ABNORMAL HIGH (ref 70–99)

## 2021-06-12 MED ORDER — METOCLOPRAMIDE HCL 5 MG/ML IJ SOLN
10.0000 mg | Freq: Four times a day (QID) | INTRAMUSCULAR | Status: DC
  Administered 2021-06-12 – 2021-06-14 (×8): 10 mg via INTRAVENOUS
  Filled 2021-06-12 (×8): qty 2

## 2021-06-12 MED ORDER — LABETALOL HCL 5 MG/ML IV SOLN
10.0000 mg | INTRAVENOUS | Status: DC | PRN
  Administered 2021-06-12: 10 mg via INTRAVENOUS
  Filled 2021-06-12 (×2): qty 4

## 2021-06-12 NOTE — Progress Notes (Signed)
Inpatient Diabetes Program Recommendations ? ?AACE/ADA: New Consensus Statement on Inpatient Glycemic Control (2015) ? ?Target Ranges:  Prepandial:   less than 140 mg/dL ?     Peak postprandial:   less than 180 mg/dL (1-2 hours) ?     Critically ill patients:  140 - 180 mg/dL  ? ?Lab Results  ?Component Value Date  ? GLUCAP 140 (H) 06/12/2021  ? HGBA1C 9.7 (H) 05/28/2021  ? ? ?Review of Glycemic Control ? ?Diabetes history: Type 1 DM ?Outpatient Diabetes medications: Tandem insulin pump with Novolin R ?Basal - 0.5/H ?Bolus CHO ratio 1:12 ?CF 50 with goal of 110 mg/dL ?Current orders for Inpatient glycemic control: Levemir 15 units BID, Novolog 0-15 units Q4H ? ?HgbA1C - 9.7% ?CBGs today: 170, 140 mg/dL ?Poor po intake continues ? ?Inpatient Diabetes Program Recommendations:   ? ?Agree with orders. When eating, will need Novolog 2-3 units TID with meals. ? ?May need reduction in Levemir to 10 units BID (insulin pump basal is 12 units QD. ? ?Pt states she will put pump back on when supplies come in . Has f/u appt with Endo soon. ? ?Pt requesting GI consult while inpatient. Asked if she has gone to GI appt and she said, "No, I'm always in the hospital." ? ?Will follow. ? ?Thank you. ?Ailene Ards, RD, LDN, CDE ?Inpatient Diabetes Coordinator ?[phone]  ? ? ? ? ? ? ? ? ? ? ? ? ? ? ? ? ?

## 2021-06-12 NOTE — Progress Notes (Signed)
?PROGRESS NOTE ? ?[name]  LKT:[id number] DOB: August 17, 1990 DOA: 06/10/2021 ?PCP: Loyola Mast, MD  ? ?Brief Narrative: ?Patient is a 31 year old female with history of uncontrolled hypertension, diabetes type 1, gastroparesis, GERD, depression/anxiety who presented with nausea and vomiting from home.  She was found to be severely hyperglycemic on presentation with hyperosmolar nonketotic state.  She was started on insulin drip.  Hospital course remarkable for persistent nausea, abdominal pain ? ? ?Assessment & Plan: ? ?Principal Problem: ?  Hyperglycemia ?Active Problems: ?  Intractable nausea and vomiting ?  Diabetic gastroparesis associated with type 1 diabetes mellitus (HCC) ?  Hypertension associated with diabetes (HCC) ?  AKI (acute kidney injury) (HCC) ?  Hyperglycemia due to diabetes mellitus (HCC) ? ? ?Diabetes type 1/hyperglycemia: On presentation ,she was hyperglycemic with blood glucose in the range of 400s.  Anion gap normal but on the higher range.  Started on insulin drip.  Blood sugars have been stable.  Continue current insulin regimen.  Diabetic coordinator following.  She uses tandem insulin pump at home.  She follows with endocrinology. ?Recent hemoglobin A1c of 9.7.  We will check with diabetic coordinator what she needs for discharge ? ?Abdomen pain/nausea/vomiting: Complains of persistent abdominal discomfort requesting for narcotics.  Long discussion held with the bedside that narcotics are not the first-line drug for her abdominal pain.  Her abdominal pain is most likely secondary to diabetic gastroparesis.  Started on Reglan.  Counseled the patient to minimize the use of narcotics as it might cause nausea.  She will be instructed to take small volume frequent nonfatty meals.  She might need gastric motility as an outpatient. ? ?Hypertension: Home medications resumed.  BP is elevated most likely secondary to abdominal discomfort.  Monitor ? ?AKI: Resolved with IV  fluids. ? ? ? ? ?  ? ?DVT prophylaxis:enoxaparin (LOVENOX) injection 40 mg Start: 06/10/21 2200 ? ? ?  Code Status: Full Code ? ?Family Communication: None at bedside ? ?Patient status:Inpatient ? ?Patient is from :Home ? ?Anticipated discharge DS:KAJG ? ?Estimated DC date:tomorrow ? ? ?Consultants: None ? ?Procedures:None ? ?Antimicrobials:  ?Anti-infectives (From admission, onward)  ? ? None  ? ?  ? ? ?Subjective: ?Patient seen and examined at the bedside this morning.  Complains of persistent abdominal pain, requesting for narcotics.  On evaluation at the bedside, she actually looks comfortable.  Long discussion had at the bedside about the management of her pain and the potential cause ? ?Objective: ?Vitals:  ? 06/12/21 0531 06/12/21 8115 06/12/21 7262 06/12/21 0355  ?BP: (!) 175/114 (!) 173/117 (!) 139/95 (!) 138/95  ?Pulse: (!) 123 (!) 118 (!) 109 (!) 108  ?Resp: 16   14  ?Temp: 98.4 ?F (36.9 ?C)   98.6 ?F (37 ?C)  ?TempSrc: Oral   Oral  ?SpO2: 100%   99%  ?Weight:      ?Height:      ? ? ?Intake/Output Summary (Last 24 hours) at 06/12/2021 1009 ?Last data filed at 06/12/2021 0600 ?Gross per 24 hour  ?Intake 1993.46 ml  ?Output 1200 ml  ?Net 793.46 ml  ? ?Filed Weights  ? 06/10/21 2032  ?Weight: 81.3 kg  ? ? ?Examination: ? ?General exam: Overall comfortable, not in distress ?HEENT: PERRL ?Respiratory system:  no wheezes or crackles  ?Cardiovascular system: S1 & S2 heard, RRR.  ?Gastrointestinal system: Abdomen is nondistended, soft and nontender. ?Central nervous system: Alert and oriented ?Extremities: No edema, no clubbing ,no cyanosis ?Skin: No rashes, no ulcers,no icterus   ? ? ?  Data Reviewed: I have personally reviewed following labs and imaging studies ? ?CBC: ?Recent Labs  ?Lab 06/06/21 ?0418 06/10/21 ?1411  ?WBC 9.7 9.3  ?NEUTROABS 3.2 5.7  ?HGB 10.3* 13.1  ?HCT 31.9* 38.7  ?MCV 90.6 87.2  ?PLT 302 436*  ? ?Basic Metabolic Panel: ?Recent Labs  ?Lab 06/06/21 ?0418 06/07/21 ?0413 06/10/21 ?1411  06/11/21 ?6433 06/11/21 ?1026 06/11/21 ?1401 06/11/21 ?1745 06/11/21 ?2228  ?NA 137 139   < > 139 140 140 139 140  ?K 3.8 3.8   < > 3.6 4.4 4.8 4.5 3.7  ?CL 108 109   < > 104 106 104 107 105  ?CO2 24 26   < > 26 26 24 24 28   ?GLUCOSE 194* 203*   < > 158* 209* 238* 238* 194*  ?BUN 12 9   < > 15 15 16 16 16   ?CREATININE 0.86 0.83   < > 1.09* 0.93 1.16* 1.08* 1.11*  ?CALCIUM 8.9 8.5*   < > 9.5 9.6 9.3 9.2 9.2  ?MG 1.5* 1.8  --   --   --   --   --   --   ? < > = values in this interval not displayed.  ? ? ? ?Recent Results (from the past 240 hour(s))  ?MRSA Next Gen by PCR, Nasal     Status: Abnormal  ? Collection Time: 06/10/21  8:30 PM  ? Specimen: Nasal Mucosa; Nasal Swab  ?Result Value Ref Range Status  ? MRSA by PCR Next Gen NEGATIVE (A) NOT DETECTED Final  ?  Comment:        ?The GeneXpert MRSA Assay (FDA ?approved for NASAL specimens ?only), is one component of a ?comprehensive MRSA colonization ?surveillance program. It is not ?intended to diagnose MRSA ?infection nor to guide or ?monitor treatment for ?MRSA infections. ?Performed at [facility], [address], [place], [place] ?  ?  ? ?Radiology Studies: ?No results found. ? ?Scheduled Meds: ? atorvastatin  20 mg Oral Daily  ? enoxaparin (LOVENOX) injection  40 mg Subcutaneous Q24H  ? gabapentin  300 mg Oral Q8H  ? insulin aspart  0-15 Units Subcutaneous Q4H  ? insulin detemir  15 Units Subcutaneous BID  ? lisinopril  10 mg Oral Daily  ? metoCLOPramide (REGLAN) injection  10 mg Intravenous Q6H  ? pantoprazole (PROTONIX) IV  40 mg Intravenous Daily  ? ?Continuous Infusions: ? sodium chloride Stopped (06/11/21 1026)  ? lactated ringers Stopped (06/10/21 2333)  ? ? ? LOS: 1 day  ? ?06/12/21, MD ?[facility] ?P4/26/2023, 10:09 AM   ?

## 2021-06-13 DIAGNOSIS — R739 Hyperglycemia, unspecified: Secondary | ICD-10-CM | POA: Diagnosis not present

## 2021-06-13 LAB — BASIC METABOLIC PANEL
Anion gap: 5 (ref 5–15)
BUN: 14 mg/dL (ref 6–20)
CO2: 27 mmol/L (ref 22–32)
Calcium: 8.9 mg/dL (ref 8.9–10.3)
Chloride: 106 mmol/L (ref 98–111)
Creatinine, Ser: 1.02 mg/dL — ABNORMAL HIGH (ref 0.44–1.00)
GFR, Estimated: >60 mL/min (ref >60)
Glucose, Bld: 56 mg/dL — ABNORMAL LOW (ref 70–99)
Potassium: 3.8 mmol/L (ref 3.5–5.1)
Sodium: 138 mmol/L (ref 135–145)

## 2021-06-13 LAB — GLUCOSE, CAPILLARY
Glucose-Capillary: 134 mg/dL — ABNORMAL HIGH (ref 70–99)
Glucose-Capillary: 138 mg/dL — ABNORMAL HIGH (ref 70–99)
Glucose-Capillary: 189 mg/dL — ABNORMAL HIGH (ref 70–99)
Glucose-Capillary: 201 mg/dL — ABNORMAL HIGH (ref 70–99)
Glucose-Capillary: 243 mg/dL — ABNORMAL HIGH (ref 70–99)
Glucose-Capillary: 57 mg/dL — ABNORMAL LOW (ref 70–99)
Glucose-Capillary: 89 mg/dL (ref 70–99)

## 2021-06-13 MED ORDER — INSULIN ASPART 100 UNIT/ML IJ SOLN
0.0000 [IU] | Freq: Three times a day (TID) | INTRAMUSCULAR | Status: DC
  Administered 2021-06-13: 3 [IU] via SUBCUTANEOUS
  Administered 2021-06-13 – 2021-06-14 (×2): 2 [IU] via SUBCUTANEOUS

## 2021-06-13 MED ORDER — INSULIN ASPART 100 UNIT/ML IJ SOLN
0.0000 [IU] | Freq: Every day | INTRAMUSCULAR | Status: DC
  Administered 2021-06-13: 2 [IU] via SUBCUTANEOUS

## 2021-06-13 MED ORDER — INSULIN DETEMIR 100 UNIT/ML ~~LOC~~ SOLN
10.0000 [IU] | Freq: Two times a day (BID) | SUBCUTANEOUS | Status: DC
  Administered 2021-06-13 – 2021-06-14 (×2): 10 [IU] via SUBCUTANEOUS
  Filled 2021-06-13 (×3): qty 0.1

## 2021-06-13 NOTE — Progress Notes (Signed)
?PROGRESS NOTE ? ?Kelli Hudson  LDJ:[id number] DOB: 19-Oct-1990 DOA: 06/10/2021 ?PCP: Loyola Mast, MD  ? ?Brief Narrative: ?Patient is a 31 year old female with history of uncontrolled hypertension, diabetes type 1, gastroparesis, GERD, depression/anxiety who presented with nausea and vomiting from home.  She was found to be severely hyperglycemic on presentation with hyperosmolar nonketotic state.  She was started on insulin drip,now stopped.  Hospital course remarkable for persistent nausea, abdominal pain most likely from diabetic gastroparesis.  Feeling better today, plan for discharge tomorrow to home if she tolerates oral diet ? ? ?Assessment & Plan: ? ?Principal Problem: ?  Hyperglycemia ?Active Problems: ?  Intractable nausea and vomiting ?  Diabetic gastroparesis associated with type 1 diabetes mellitus (HCC) ?  Hypertension associated with diabetes (HCC) ?  AKI (acute kidney injury) (HCC) ?  Hyperglycemia due to diabetes mellitus (HCC) ? ? ?Diabetes type 1/hyperglycemia: On presentation ,she was hyperglycemic with blood glucose in the range of 400s.  Anion gap normal but on the higher range.  Started on insulin drip, now stopped.  Blood sugars have been stable.  Continue current insulin regimen.  Diabetic coordinator following.  She uses tandem insulin pump at home.  She follows with endocrinology. ?Recent hemoglobin A1c of 9.7.  We will check with diabetic coordinator what she needs for discharge ? ?Abdomen pain/nausea/vomiting: Complains of persistent abdominal discomfort requesting for narcotics.  Long discussion held with the bedside that narcotics are not the first-line drug for her abdominal pain.  Her abdominal pain is most likely secondary to diabetic gastroparesis.  Started on Reglan.  Counseled the patient to minimize the use of narcotics as it might cause nausea.  She will be instructed to take small volume frequent nonfatty meals.  She might need gastric motility as an outpatient.  We  will send this message to [facility] gastroenterology. ? ?Hypertension: Home medications resumed.  Currently normotensive ? ?AKI: Resolved with IV fluids. ? ? ? ? ?  ? ?DVT prophylaxis:enoxaparin (LOVENOX) injection 40 mg Start: 06/10/21 2200 ? ? ?  Code Status: Full Code ? ?Family Communication: None at bedside ? ?Patient status:Inpatient ? ?Patient is from :Home ? ?Anticipated discharge QZ:ESPQ ? ?Estimated DC date:tomorrow ? ? ?Consultants: None ? ?Procedures:None ? ?Antimicrobials:  ?Anti-infectives (From admission, onward)  ? ? None  ? ?  ? ? ?Subjective: ?Patient seen and examined at the bedside this morning.  Hemodynamically stable.  Feels better today.  She did not vomit last night.  She wants to try full liquid diet today, agreeable for advancing the diet this afternoon if she tolerates with plan for discharge tomorrow to home. ? ?Objective: ?Vitals:  ? 06/12/21 1629 06/12/21 1808 06/12/21 2201 06/13/21 0522  ?BP: 130/88 106/65 105/73 114/74  ?Pulse: (!) 108 (!) 107 (!) 103 98  ?Resp:  18 18 18   ?Temp:  98.8 ?F (37.1 ?C) 98.2 ?F (36.8 ?C) 98.2 ?F (36.8 ?C)  ?TempSrc:  Oral Oral Oral  ?SpO2:  98% 97% 99%  ?Weight:      ?Height:      ? ? ?Intake/Output Summary (Last 24 hours) at 06/13/2021 1018 ?Last data filed at 06/13/2021 1000 ?Gross per 24 hour  ?Intake 4126.67 ml  ?Output 2200 ml  ?Net 1926.67 ml  ? ?Filed Weights  ? 06/10/21 2032  ?Weight: 81.3 kg  ? ? ?Examination: ? ?General exam: Overall comfortable, not in distress ?HEENT: PERRL ?Respiratory system:  no wheezes or crackles  ?Cardiovascular system: S1 & S2 heard, RRR.  ?Gastrointestinal system: Abdomen  is nondistended, soft and nontender. ?Central nervous system: Alert and oriented ?Extremities: No edema, no clubbing ,no cyanosis ?Skin: No rashes, no ulcers,no icterus     ? ? ?Data Reviewed: I have personally reviewed following labs and imaging studies ? ?CBC: ?Recent Labs  ?Lab 06/10/21 ?1411  ?WBC 9.3  ?NEUTROABS 5.7  ?HGB 13.1  ?HCT 38.7  ?MCV 87.2   ?PLT 436*  ? ?Basic Metabolic Panel: ?Recent Labs  ?Lab 06/07/21 ?0413 06/10/21 ?1411 06/11/21 ?1026 06/11/21 ?1401 06/11/21 ?1745 06/11/21 ?2228 06/13/21 ?0401  ?NA 139   < > 140 140 139 140 138  ?K 3.8   < > 4.4 4.8 4.5 3.7 3.8  ?CL 109   < > 106 104 107 105 106  ?CO2 26   < > 26 24 24 28 27   ?GLUCOSE 203*   < > 209* 238* 238* 194* 56*  ?BUN 9   < > 15 16 16 16 14   ?CREATININE 0.83   < > 0.93 1.16* 1.08* 1.11* 1.02*  ?CALCIUM 8.5*   < > 9.6 9.3 9.2 9.2 8.9  ?MG 1.8  --   --   --   --   --   --   ? < > = values in this interval not displayed.  ? ? ? ?Recent Results (from the past 240 hour(s))  ?MRSA Next Gen by PCR, Nasal     Status: Abnormal  ? Collection Time: 06/10/21  8:30 PM  ? Specimen: Nasal Mucosa; Nasal Swab  ?Result Value Ref Range Status  ? MRSA by PCR Next Gen NEGATIVE (A) NOT DETECTED Final  ?  Comment:        ?The GeneXpert MRSA Assay (FDA ?approved for NASAL specimens ?only), is one component of a ?comprehensive MRSA colonization ?surveillance program. It is not ?intended to diagnose MRSA ?infection nor to guide or ?monitor treatment for ?MRSA infections. ?Performed at [facility], [address], [place], [place] ?  ?  ? ?Radiology Studies: ?No results found. ? ?Scheduled Meds: ? atorvastatin  20 mg Oral Daily  ? enoxaparin (LOVENOX) injection  40 mg Subcutaneous Q24H  ? gabapentin  300 mg Oral Q8H  ? insulin aspart  0-5 Units Subcutaneous QHS  ? insulin aspart  0-9 Units Subcutaneous TID WC  ? insulin detemir  10 Units Subcutaneous BID  ? lisinopril  10 mg Oral Daily  ? metoCLOPramide (REGLAN) injection  10 mg Intravenous Q6H  ? pantoprazole (PROTONIX) IV  40 mg Intravenous Daily  ? ?Continuous Infusions: ? sodium chloride Stopped (06/11/21 1026)  ? lactated ringers 125 mL/hr at 06/13/21 06/13/21  ? ? ? LOS: 2 days  ? ?06/15/21, MD ?[facility] ?P4/27/2023, 10:18 AM   ?

## 2021-06-13 NOTE — Progress Notes (Signed)
Inpatient Diabetes Program Recommendations ? ?AACE/ADA: New Consensus Statement on Inpatient Glycemic Control (2015) ? ?Target Ranges:  Prepandial:   less than 140 mg/dL ?     Peak postprandial:   less than 180 mg/dL (1-2 hours) ?     Critically ill patients:  140 - 180 mg/dL  ? ?Lab Results  ?Component Value Date  ? GLUCAP 201 (H) 06/13/2021  ? HGBA1C 9.7 (H) 05/28/2021  ? ? ?Review of Glycemic Control ? ?Diabetes history: DM1 ?Outpatient Diabetes medications: insulin pump ?Current orders for Inpatient glycemic control: Levemir 10 BID, Novolog 0-9 units TID with meals and 0-5 HS ? ?Inpatient Diabetes Program Recommendations:   ? ?For discharge: ? ?Levemir 16 units QD ?Novolog 0-9 units TID with meals and 0-5 HS ?Novolog 1:15 units for CHO ratio ? ?F/U with Endo prior to restarting insulin pump. ? ?Spoke with pt at bedside regarding f/u with Endo to restart pump. Discussed hypoglycemia s/s and treatment. Also discussed sick day rules and importance of controlling blood sugars to prevent long and short-term complications. Encouraged pt to call and make Endo appt for next week.  ? ?Thank you. ?Ailene Ards, RD, LDN, CDE ?Inpatient Diabetes Coordinator ?[phone]  ? ? ? ? ? ? ? ? ? ?

## 2021-06-13 NOTE — Progress Notes (Signed)
Inpatient Diabetes Program Recommendations ? ?AACE/ADA: New Consensus Statement on Inpatient Glycemic Control (2015) ? ?Target Ranges:  Prepandial:   less than 140 mg/dL ?     Peak postprandial:   less than 180 mg/dL (1-2 hours) ?     Critically ill patients:  140 - 180 mg/dL  ? ?Lab Results  ?Component Value Date  ? GLUCAP 138 (H) 06/13/2021  ? HGBA1C 9.7 (H) 05/28/2021  ? ? ?Review of Glycemic Control ? ? ?Current orders for Inpatient glycemic control: Levemir 15 BID, Novolog 0-15 Q4H ? ?Inpatient Diabetes Program Recommendations:   ? ?Consider decreasing Levemir to 10 units BID ? ?Consider decreasing Novolog to 0-9 units TID with meals and 0-5 HS (Type 1 and sensitive to insulin) ? ?Continue to encourage pt to f/u with Endo prior to putting insulin pump back on. ? ?Continue to follow. ? ?Thank you. ?Lorenda Peck, RD, LDN, CDE ?Inpatient Diabetes Coordinator ?[phone]  ? ? ? ? ?

## 2021-06-13 NOTE — Progress Notes (Signed)
Date and time results received: 06/13/21 0353 ? ?(use smartphrase ".now" to insert current time) ? ?Test: blood glucose ?Critical Value: 57mg /dl ? ?Name of Provider Notified:  ? ?Orders Received? Or Actions Taken?: Pt was given 1 italian popsicles, CBG rechecked and it came up to 89 mg/dl ?

## 2021-06-14 DIAGNOSIS — R739 Hyperglycemia, unspecified: Secondary | ICD-10-CM | POA: Diagnosis not present

## 2021-06-14 LAB — GLUCOSE, CAPILLARY
Glucose-Capillary: 146 mg/dL — ABNORMAL HIGH (ref 70–99)
Glucose-Capillary: 174 mg/dL — ABNORMAL HIGH (ref 70–99)
Glucose-Capillary: 174 mg/dL — ABNORMAL HIGH (ref 70–99)

## 2021-06-14 MED ORDER — LEVEMIR FLEXTOUCH 100 UNIT/ML ~~LOC~~ SOPN: 16.0000 [IU] | PEN_INJECTOR | Freq: Every day | SUBCUTANEOUS | 0 refills | Status: DC

## 2021-06-14 MED ORDER — METOCLOPRAMIDE HCL 10 MG PO TABS: 10.0000 mg | ORAL_TABLET | Freq: Three times a day (TID) | ORAL | 0 refills | Status: DC | PRN

## 2021-06-14 MED ORDER — BLOOD GLUCOSE METER KIT: PACK | 0 refills | Status: DC

## 2021-06-14 MED ORDER — INSULIN REGULAR HUMAN 100 UNIT/ML IJ SOLN: 0.0000 [IU] | INTRAMUSCULAR | 1 refills | Status: DC

## 2021-06-14 MED ORDER — PEN NEEDLES 3/16" 31G X 5 MM MISC
1.0000 | Freq: Three times a day (TID) | 0 refills | Status: DC
Start: 2021-06-14 — End: 2021-10-04

## 2021-06-14 NOTE — Progress Notes (Signed)
Nurse reviewed discharge instructions with pt. Pt verbalized understanding of discharge instructions, follow up appointments and new medications.  Pt boyfriend here to pick up pt.  No concerns at time of discharge. ?

## 2021-06-14 NOTE — Discharge Summary (Signed)
Physician Discharge Summary  ?Kelli Hudson AQT:[id number] DOB: 1990-06-20 DOA: 06/10/2021 ? ?PCP: [name], MD ? ?Admit date: 06/10/2021 ?Discharge date: 06/14/2021 ? ?Admitted From: Home ?Disposition:  Home ? ?Discharge Condition:Stable ?CODE STATUS:FULL ?Diet recommendation: Carb Modified  ? ? ?Brief/Interim Summary: ?Patient is a 31 year old female with history of uncontrolled hypertension, diabetes type 1, gastroparesis, GERD, depression/anxiety who presented with nausea and vomiting from home.  She was found to be severely hyperglycemic on presentation with hyperosmolar nonketotic state.  She was started on insulin drip,now stopped.  Hospital course remarkable for persistent nausea, abdominal pain most likely from diabetic gastroparesis.  Feeling better today, tolerating soft diet so planning for discharge home today.  She will follow-up with her endocrinologist as soon as possible. ? ?Following problems were addressed during her hospitalization: ? ?Diabetes type 1/hyperglycemia: On presentation ,she was hyperglycemic with blood glucose in the range of 400s.  Anion gap normal but on the higher range.  Started on insulin drip, now stopped.  Blood sugars have been stable..  Diabetic coordinator following.  She uses tandem insulin pump at home.  She follows with endocrinology. ?Recent hemoglobin A1c of 9.7.  She will be discharged with Levemir until she follows with her endocrinologist and then she will be started on insulin pump.  She has been instructed to monitor blood glucose at home.   ? ?Abdomen pain/nausea/vomiting: Complained of persistent abdominal discomfort requesting for narcotics.  Long discussion held with the bedside that narcotics are not the first-line drug for her abdominal pain.  Her abdominal pain is most likely secondary to diabetic gastroparesis.  Started on Reglan.  Counseled the patient to minimize the use of narcotics as it might cause nausea.  She will be instructed to take  small volume frequent nonfatty meals.  She might need gastric motility as an outpatient.  She has an appointment with [facility] gastroenterology on May 23. ?  ?Hypertension: Home medications resumed.  Currently normotensive ?  ?AKI: Resolved with IV fluids. ?  ? ?Discharge Diagnoses:  ?Principal Problem: ?  Hyperglycemia ?Active Problems: ?  Intractable nausea and vomiting ?  Diabetic gastroparesis associated with type 1 diabetes mellitus ([place]) ?  Hypertension associated with diabetes ([place]) ?  AKI (acute kidney injury) ([place]) ?  Hyperglycemia due to diabetes mellitus ([place]) ? ? ? ?Discharge Instructions ? ?Discharge Instructions   ? ? Diet Carb Modified   Complete by: As directed ?  ? Discharge instructions   Complete by: As directed ?  ? 1)Please take prescribed medications as instructed.  Monitor blood sugars at home ?2)Follow up with your endocrinologist as soon as possible within a week, and then start insulin pump ?3)Follow up with the gastroenterologist on the given appointment date ?4)Take small volume frequent nonfatty meals  ? Increase activity slowly   Complete by: As directed ?  ? ?  ? ?Allergies as of 06/14/2021   ?No Known Allergies ?  ? ?  ?Medication List  ?  ? ?STOP taking these medications   ? ?alum & mag hydroxide-simeth 200-200-20 MG/5ML suspension ?Commonly known as: MAALOX/MYLANTA ?  ? ?  ? ?TAKE these medications   ? ?albuterol 108 (90 Base) MCG/ACT inhaler ?Commonly known as: VENTOLIN HFA ?Inhale 2 puffs into the lungs every 6 (six) hours as needed for shortness of breath. ?  ?atorvastatin 20 MG tablet ?Commonly known as: LIPITOR ?Take 1 tablet (20 mg total) by mouth daily. ?  ?blood glucose meter kit and supplies ?Dispense based on patient and insurance  preference. Use up to four times daily as directed. (FOR ICD-10 E10.9, E11.9). ?What changed:  ?medication strength ?how much to take ?how to take this ?when to take this ?additional instructions ?  ?Dexcom G6 Receiver Devi ?USE AS DIRECTED ?What  changed:  ?how much to take ?how to take this ?when to take this ?  ?Dexcom G6 Sensor Misc ?1 each by Does not apply route daily. ?  ?Dexcom G6 Transmitter Misc ?1 each by Does not apply route daily. ?  ?dicyclomine 20 MG tablet ?Commonly known as: BENTYL ?Take 1 tablet (20 mg total) by mouth 3 (three) times daily as needed for spasms. ?  ?gabapentin 300 MG capsule ?Commonly known as: NEURONTIN ?Take 1 capsule (300 mg total) by mouth 3 (three) times daily. ?  ?insulin regular 100 units/mL injection ?Commonly known as: NovoLIN R ?Inject 0-0.15 mLs (0-15 Units total) into the skin See admin instructions. Via pump.Start this medication after you follow up with your PCP ?What changed: additional instructions ?  ?Levemir FlexTouch 100 UNIT/ML FlexPen ?Generic drug: insulin detemir ?Inject 16 Units into the skin daily. ?  ?lisinopril 10 MG tablet ?Commonly known as: ZESTRIL ?Take 1 tablet (10 mg total) by mouth daily. ?  ?metoCLOPramide 10 MG tablet ?Commonly known as: Reglan ?Take 1 tablet (10 mg total) by mouth every 8 (eight) hours as needed for nausea. ?What changed:  ?when to take this ?reasons to take this ?  ?pantoprazole 40 MG tablet ?Commonly known as: PROTONIX ?Take 1 tablet (40 mg total) by mouth 2 (two) times daily before a meal. ?  ?Pen Needles 3/16" 31G X 5 MM Misc ?1 each by Does not apply route in the morning, at noon, and at bedtime. ?What changed:  ?how much to take ?when to take this ?additional instructions ?  ?promethazine 12.5 MG tablet ?Commonly known as: PHENERGAN ?Take 1 tablet (12.5 mg total) by mouth every 6 (six) hours as needed for nausea or vomiting. ?  ?True Metrix Blood Glucose Test test strip ?Generic drug: glucose blood ?Use as instructed ?  ?TRUEplus Lancets 28G Misc ?assist with checking blood sugar TID and qhs ?What changed:  ?how much to take ?how to take this ?when to take this ?  ? ?  ? ? Follow-up Information   ? ? [name], MD .   ?Specialty: Family Medicine ?Contact  information: ?[address] ?[place] [address] ?[phone] ? ? ?  ?  ? ? [facility] DEPT. Go to .   ?Specialty: Emergency Medicine ?Why: As needed, If symptoms worsen ?Contact information: ?[place] ?[phone] mc ?[place] ?[phone] ? ?  ?  ? ? Yetta Flock, MD Follow up on 07/09/2021.   ?Specialty: Gastroenterology ?Why: 9:20am ?Contact information: ?[address] ?[address] ?[place] [address] ?[phone] ? ? ?  ?  ? ?  ?  ? ?  ? ?No Known Allergies ? ?Consultations: ?None ? ? ?Procedures/Studies: ?DG Chest 2 View ? ?Result Date: 06/04/2021 ?CLINICAL DATA:  Chest pain. EXAM: CHEST - 2 VIEW COMPARISON:  Chest x-ray April 22, 2021. FINDINGS: No consolidation. No visible pleural effusions or pneumothorax. Cardiomediastinal silhouette is within normal limits. No evidence of acute osseous abnormality. IMPRESSION: No evidence of acute cardiopulmonary disease. Electronically Signed   By: Margaretha Sheffield M.D.   On: 06/04/2021 10:50  ? ?US RENAL ? ?Result Date: 05/28/2021 ?CLINICAL DATA:  Acute kidney injury EXAM: RENAL / URINARY TRACT ULTRASOUND COMPLETE  COMPARISON:  None. FINDINGS: Right Kidney: Renal measurements: 10.2 x 4.6 x 6.3 cm = volume: 151 mL. Echogenicity within normal limits. No mass or hydronephrosis visualized. Left Kidney: Renal measurements: 11.8 x 5.6 x 5.4 cm = volume: 185 mL. Echogenicity within normal limits. No mass or hydronephrosis visualized. Bladder: Appears normal for degree of bladder distention. Other: None. IMPRESSION: No significant sonographic abnormality of the kidneys. Electronically Signed   By: Miachel Roux M.D.   On: 05/28/2021 11:44  ? ?DG Foot Complete Left ? ?Result Date: 05/17/2021 ?Please see detailed radiograph report in office note. ? ?US THYROID ? ?Result Date: 05/15/2021 ?CLINICAL DATA:  Multinodular goiter follow-up EXAM: THYROID ULTRASOUND TECHNIQUE: Ultrasound examination of the  thyroid gland and adjacent soft tissues was performed. COMPARISON:  03/15/2020 FINDINGS: Parenchymal Echotexture: Mildly heterogenous Isthmus: 0.3 cm Right lobe: 6.4 x 1.6 x 2.4 cm Left lobe: 6.0 x 2.1 x 2.1 cm

## 2021-06-19 ENCOUNTER — Ambulatory Visit: Payer: 59

## 2021-06-19 DIAGNOSIS — E103593 Type 1 diabetes mellitus with proliferative diabetic retinopathy without macular edema, bilateral: Secondary | ICD-10-CM

## 2021-06-19 DIAGNOSIS — K3184 Gastroparesis: Secondary | ICD-10-CM

## 2021-06-19 NOTE — Chronic Care Management (AMB) (Signed)
? Care Management ?  ? RN Visit Note ? ?06/19/2021 ?Name: Kelli Hudson MRN: [id number] DOB: 1990/11/24 ? ?Subjective: ?Kelli Hudson is a 31 y.o. year old female who is a primary care patient of Kelli Hudson, Kelli Boxer, MD. The care management team was consulted for assistance with disease management and care coordination needs.   ? ?Engaged with patient by telephone for follow up visit in response to provider referral for case management and/or care coordination services.  ? ?Consent to Services:  ? Kelli Hudson was given information about Care Management services today including:  ?Care Management services includes personalized support from designated clinical staff supervised by her physician, including individualized plan of care and coordination with other care providers ?24/7 contact phone numbers for assistance for urgent and routine care needs. ?The patient may stop case management services at any time by phone call to the office staff. ? ?Patient agreed to services and consent obtained.  ? ?Assessment: Review of patient past medical history, allergies, medications, health status, including review of consultants reports, laboratory and other test data, was performed as part of comprehensive evaluation and provision of chronic care management services.  ? ?SDOH (Social Determinants of Health) assessments and interventions performed:   ? ?Care Plan ? ?No Known Allergies ? ?Outpatient Encounter Medications as of 06/19/2021  ?Medication Sig Note  ? albuterol (VENTOLIN HFA) 108 (90 Base) MCG/ACT inhaler Inhale 2 puffs into the lungs every 6 (six) hours as needed for shortness of breath.   ? atorvastatin (LIPITOR) 20 MG tablet Take 1 tablet (20 mg total) by mouth daily.   ? blood glucose meter kit and supplies Dispense based on patient and insurance preference. Use up to four times daily as directed. (FOR ICD-10 E10.9, E11.9).   ? Continuous Blood Gluc Receiver (DEXCOM G6 RECEIVER) DEVI USE AS DIRECTED  (Patient taking differently: 1 each by Other route as directed.)   ? Continuous Blood Gluc Sensor (DEXCOM G6 SENSOR) MISC 1 each by Does not apply route daily.   ? Continuous Blood Gluc Transmit (DEXCOM G6 TRANSMITTER) MISC 1 each by Does not apply route daily.   ? dicyclomine (BENTYL) 20 MG tablet Take 1 tablet (20 mg total) by mouth 3 (three) times daily as needed for spasms.   ? gabapentin (NEURONTIN) 300 MG capsule Take 1 capsule (300 mg total) by mouth 3 (three) times daily. (Patient not taking: Reported on 06/19/2021) 06/11/2021: No record of this being filled in past year - pt thinks she might still have some at home but is not sure.  ? glucose blood (TRUE METRIX BLOOD GLUCOSE TEST) test strip Use as instructed   ? insulin detemir (LEVEMIR FLEXTOUCH) 100 UNIT/ML FlexPen Inject 16 Units into the skin daily.   ? Insulin Pen Needle (PEN NEEDLES 3/16") 31G X 5 MM MISC 1 each by Does not apply route in the morning, at noon, and at bedtime.   ? insulin regular (NOVOLIN R) 100 units/mL injection Inject 0-0.15 mLs (0-15 Units total) into the skin See admin instructions. Via pump.Start this medication after you follow up with your PCP   ? lisinopril (ZESTRIL) 10 MG tablet Take 1 tablet (10 mg total) by mouth daily.   ? metoCLOPramide (REGLAN) 10 MG tablet Take 1 tablet (10 mg total) by mouth every 8 (eight) hours as needed for nausea.   ? pantoprazole (PROTONIX) 40 MG tablet Take 1 tablet (40 mg total) by mouth 2 (two) times daily before a meal.   ? promethazine (PHENERGAN) 12.5 MG  tablet Take 1 tablet (12.5 mg total) by mouth every 6 (six) hours as needed for nausea or vomiting.   ? TRUEPLUS LANCETS 28G MISC assist with checking blood sugar TID and qhs (Patient taking differently: 1 each by Other route See admin instructions. assist with checking blood sugar TID and qhs)   ? [DISCONTINUED] insulin aspart (NOVOLOG) 100 UNIT/ML FlexPen Inject 5 Units into the skin 3 (three) times daily with meals.   ? ?No  facility-administered encounter medications on file as of 06/19/2021.  ? ? ?Patient Active Problem List  ? Diagnosis Date Noted  ? Hyperglycemia due to diabetes mellitus ([place]) 06/11/2021  ? Hyperglycemia 06/10/2021  ? Anemia of chronic disease 06/06/2021  ? Intractable vomiting with nausea 06/04/2021  ? Dehydration 06/04/2021  ? Hypomagnesemia 04/21/2021  ? Intractable nausea and vomiting 04/20/2021  ? Ulcerated, foot, left, with fat layer exposed ([place]) 04/20/2021  ? 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 DKA (diabetic ketoacidosis) ([place]) 02/24/2021  ? Type 1 diabetes mellitus with diabetic polyneuropathy ([place]) 12/18/2020  ? Uncontrolled type 1 diabetes mellitus with hyperglycemia, with long-term current use of insulin ([place]) 12/18/2020  ? DM (diabetes mellitus), type 1 ([place]) 12/18/2020  ? 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 Diabetic retinopathy of both eyes associated with type 1 diabetes mellitus ([place]) 09/24/2020  ? Diabetic gastroparesis associated with type 1 diabetes mellitus ([place]) 08/12/2020  ? AKI (acute kidney injury) ([place]) 07/25/2020  ? GERD (gastroesophageal reflux disease) 07/23/2020  ? Diabetic nephropathy associated with type 1 diabetes mellitus ([place]) 06/27/2020  ? Hyperlipidemia 05/23/2020  ? Normocytic anemia 04/20/2020  ? Hyperosmolar hyperglycemic state (HHS) ([place]) 02/24/2020  ? Intractable abdominal pain 01/24/2020  ? H. pylori infection 10/07/2019  ? Depression with anxiety 07/05/2019  ? Diabetic polyneuropathy associated with type 1 diabetes mellitus ([place]) 09/24/2017  ? Leukocytosis   ? Goiter 10/05/2009  ? Hypertension associated with diabetes ([place]) 10/05/2009  ? ? ?Conditions to be addressed/monitored: DMII and Gastroparesis ? ?Care Plan : RNCM plan of care  ?Updates made by Dannielle Karvonen, RN since 06/19/2021 12:00 AM  ?  ? ?Problem: Chronic disease management education and/ or care coordination needs.    ?Priority: High  ?  ? ?Long-Range Goal: Development of plan of care to address chronic disease management and/ or care coordination needs.   ?Start Date: 01/23/2021  ?Expected End Date: 08/16/2021  ?Priority: High  ?Note:   ?Current Barriers:  ?Knowledge Deficits related to plan of care for management of Diabetes I and gastroparesis  ?Chronic Disease Management support and education needs related to Diabetes I and gastroparesis ?Per chart review patient hospitalized from 06/10/21 to 06/14/21 for hyperglycemia and gastroparesis. Patient states she was admitted to the hospital because she was not taking her long acting insulin. She states she was not wearing her insulin pump because she was waiting for her insulin supplies to come in. Patient reports she has developed a cough. She states she was advised while in the hospital that this could be due to her lisinopril medication.  Patient denies any nausea at this time. She reports her foot wound has completely healed. She states she continues to see the nutritionist every 2 months.   Patient reports she sees the retina specialist every 2 months or so to receive eye shots.  Per chart review patient has follow up with her gastroenterologist on 07/09/21, follow up with primary care  provider on 08/12/21, and follow up with endocrinologist on 08/28/21.[name]    ?RNCM Clinical Goal(s):  ?Patient will verbalize basic understanding of Diabetes I and gastroparesis disease process and self health management plan as evidenced by patient report and/ or notation in chart.  ?take all medications exactly as prescribed and will call provider for medication related questions as evidenced by patient report and /or notation in chart    ?attend all scheduled medical appointments:   as evidenced by patient report and / or notation in chart        ?continue to work with Consulting civil engineer and/or Social Worker to address care management and care coordination needs related to Diabetes I and gastroparesis as  evidenced by adherence to CM Team Scheduled appointments     through collaboration with Consulting civil engineer, provider, and care team.  ?Patient will have decrease in hospital admissions / ED visits.  ?Patient will continue follo

## 2021-06-19 NOTE — Patient Instructions (Signed)
Visit Information ? ?Thank you for taking time to visit with me today. Please don't hesitate to contact me if I can be of assistance to you before our next scheduled telephone appointment. ? ?Following are the goals we discussed today:  ?Continue to take medications as prescribed   ?Attend all scheduled provider appointments ?Call pharmacy for medication refills 3-7 days in advance of running out of medications ?Call provider office for new concerns or questions  ?Eat smaller meals more frequently and chew foods thoroughly ?Follow meal plan as advised by nutritionist ?Monitor blood sugars at least 3 times per day ?Continue to follow up with nutritionist as recommended.  ?Send message to primary care provider through Mychart regarding new cough symptom.  ? ?Our next appointment is by telephone on 07/31/21 at 10:00 am ? ?Please call the care guide team at [phone] if you need to cancel or reschedule your appointment.  ? ?If you are experiencing a Mental Health or Behavioral Health Crisis or need someone to talk to, please call the Suicide and Crisis Lifeline: 988 ?call 1-800-273-TALK (toll free, 24 hour hotline)  ? ?Patient verbalizes understanding of instructions and care plan provided today and agrees to view in MyChart. Active MyChart status confirmed with patient.   ?George Ina RN,BSN,CCM ?RN Case Manager ?[facility] [place] ?[phone] ? ?

## 2021-06-19 NOTE — Chronic Care Management (AMB) (Deleted)
Chronic Care Management   CCM RN Visit Note  06/19/2021 Name: Kelli Hudson MRN: [id number] DOB: 03/14/1990  Subjective: Kelli Hudson is a 31 y.o. year old female who is a primary care patient of Rudd, Bertram Millard, MD. The care management team was consulted for assistance with disease management and care coordination needs.    Engaged with patient by telephone for follow up visit in response to provider referral for case management and/or care coordination services.   Consent to Services:  The patient was given information about Chronic Care Management services, agreed to services, and gave verbal consent prior to initiation of services.  Please see initial visit note for detailed documentation.   Patient agreed to services and verbal consent obtained.   Assessment: Review of patient past medical history, allergies, medications, health status, including review of consultants reports, laboratory and other test data, was performed as part of comprehensive evaluation and provision of chronic care management services.   SDOH (Social Determinants of Health) assessments and interventions performed:    CCM Care Plan  No Known Allergies  Outpatient Encounter Medications as of 06/19/2021  Medication Sig Note   albuterol (VENTOLIN HFA) 108 (90 Base) MCG/ACT inhaler Inhale 2 puffs into the lungs every 6 (six) hours as needed for shortness of breath.    atorvastatin (LIPITOR) 20 MG tablet Take 1 tablet (20 mg total) by mouth daily.    blood glucose meter kit and supplies Dispense based on patient and insurance preference. Use up to four times daily as directed. (FOR ICD-10 E10.9, E11.9).    Continuous Blood Gluc Receiver (DEXCOM G6 RECEIVER) DEVI USE AS DIRECTED (Patient taking differently: 1 each by Other route as directed.)    Continuous Blood Gluc Sensor (DEXCOM G6 SENSOR) MISC 1 each by Does not apply route daily.    Continuous Blood Gluc Transmit (DEXCOM G6 TRANSMITTER) MISC 1  each by Does not apply route daily.    dicyclomine (BENTYL) 20 MG tablet Take 1 tablet (20 mg total) by mouth 3 (three) times daily as needed for spasms.    gabapentin (NEURONTIN) 300 MG capsule Take 1 capsule (300 mg total) by mouth 3 (three) times daily. (Patient not taking: Reported on 06/19/2021) 06/11/2021: No record of this being filled in past year - pt thinks she might still have some at home but is not sure.   glucose blood (TRUE METRIX BLOOD GLUCOSE TEST) test strip Use as instructed    insulin detemir (LEVEMIR FLEXTOUCH) 100 UNIT/ML FlexPen Inject 16 Units into the skin daily.    Insulin Pen Needle (PEN NEEDLES 3/16") 31G X 5 MM MISC 1 each by Does not apply route in the morning, at noon, and at bedtime.    insulin regular (NOVOLIN R) 100 units/mL injection Inject 0-0.15 mLs (0-15 Units total) into the skin See admin instructions. Via pump.Start this medication after you follow up with your PCP    lisinopril (ZESTRIL) 10 MG tablet Take 1 tablet (10 mg total) by mouth daily.    metoCLOPramide (REGLAN) 10 MG tablet Take 1 tablet (10 mg total) by mouth every 8 (eight) hours as needed for nausea.    pantoprazole (PROTONIX) 40 MG tablet Take 1 tablet (40 mg total) by mouth 2 (two) times daily before a meal.    promethazine (PHENERGAN) 12.5 MG tablet Take 1 tablet (12.5 mg total) by mouth every 6 (six) hours as needed for nausea or vomiting.    TRUEPLUS LANCETS 28G MISC assist with checking blood sugar TID and  qhs (Patient taking differently: 1 each by Other route See admin instructions. assist with checking blood sugar TID and qhs)    [DISCONTINUED] insulin aspart (NOVOLOG) 100 UNIT/ML FlexPen Inject 5 Units into the skin 3 (three) times daily with meals.    No facility-administered encounter medications on file as of 06/19/2021.    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) 04/10/2021   DKA (diabetic ketoacidosis) (HCC) 02/24/2021   Type 1 diabetes mellitus with diabetic polyneuropathy (HCC) 12/18/2020   Uncontrolled type 1 diabetes mellitus with hyperglycemia, with long-term current use of insulin (HCC) 12/18/2020   DM (diabetes mellitus), type 1 (HCC) 12/18/2020   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 pylori infection 10/07/2019   Depression with anxiety 07/05/2019   Diabetic polyneuropathy associated with type 1 diabetes mellitus (HCC) 09/24/2017   Leukocytosis    Goiter 10/05/2009   Hypertension associated with diabetes (HCC) 10/05/2009    Conditions to be addressed/monitored:DMII and Gastroparesis  Care Plan : [facility] plan of care  Updates made by Otho Ket, RN since 06/19/2021 12:00 AM     Problem: Chronic disease management education and/ or care coordination needs.   Priority: High     Long-Range Goal: Development of plan of care to address chronic disease management and/ or care coordination needs.   Start Date: 01/23/2021  Expected End Date: 08/16/2021  Priority: High  Note:   Current Barriers:   Knowledge Deficits related to plan of care for management of Diabetes I and gastroparesis  Chronic Disease Management support and education needs related to Diabetes I and gastroparesis Per chart review patient hospitalized from 06/10/21 to 06/14/21 for hyperglycemia and gastroparesis. Patient states she was admitted to the hospital because she was not taking her long acting insulin. She states she was not wearing her insulin pump because she was waiting for her insulin supplies to come in. Patient reports she has developed a cough. She states she was advised while in the hospital that this could be due to her lisinopril medication.  Patient denies any nausea at this time. She reports her foot wound has completely healed. She states she continues to see the nutritionist every 2 months.   Patient reports she sees the retina specialist every 2 months or so to receive eye shots.  Per chart review patient has follow up with her gastroenterologist on 07/09/21, follow up with primary care provider on 08/12/21, and follow up with endocrinologist on 08/28/21.[name]    RNCM Clinical Goal(s):  Patient will verbalize basic understanding of Diabetes I and gastroparesis disease process and self health management plan  as evidenced by patient report and/ or notation in chart.  take all medications exactly as prescribed and will call provider for medication related questions as evidenced by patient report and /or notation in chart    attend all scheduled medical appointments:   as evidenced by patient report and / or notation in chart        continue to work with RN Care Manager and/or Social Worker to address care management and care coordination needs related to Diabetes I and gastroparesis as evidenced by adherence to CM Team Scheduled appointments     through collaboration with RN Care manager, provider, and care team.  Patient will have decrease in hospital admissions / ED visits.  Patient will continue follow up with  nutritionist Patient will continue to work with endocrinologist/ nutritionist to obtain insulin pump  Interventions: 1:1 collaboration with primary care provider regarding development and update of comprehensive plan of care as evidenced by provider attestation and co-signature Inter-disciplinary care team collaboration (see longitudinal plan of care) Evaluation of current treatment plan related to  self management and patient's adherence to plan as established by provider   Diabetes Interventions:  (Status:  Goal on track:  Yes.) Long Term Goal Assessed patient's understanding of A1c goal: <7% Reviewed medications with patient and discussed importance of medication adherence Discussed plans with patient for ongoing care management follow up and provided patient with direct contact information for care management team Reviewed scheduled/upcoming provider appointments  Reviewed hyper/ hypoglycemic management  Advised patient to send her primary care provider a message through Mychart regarding cough symptom in relationship to her lisinopril medication Lab Results  Component Value Date   HGBA1C 9.7 (H) 05/28/2021   Gastroparesis  (Status:  Goal on track:  Yes.)  Long Term Goal Evaluation of current treatment plan related to  gastroparesis ,  self-management and patient's adherence to plan as established by provider. Discussed plans with patient for ongoing care management follow up and provided patient with direct contact information for care management team Reviewed medications with patient and discussed importance of compliance Reviewed scheduled/upcoming provider appointments  Advised patient to continue to follow up with nutritionist regarding appropriate food choices Advised to monitor blood sugars at least 3 times per day as instructed by endocrinologist.  Advised to continue to eat small frequent meals, chewing foods thoroughly.   Patient Goals/Self-Care Activities: Continue to  take medications as prescribed   Attend all scheduled provider appointments Call pharmacy for medication refills 3-7 days in advance of running out of medications Call provider office for new concerns or questions  Eat smaller meals more frequently and chew foods thoroughly Follow meal plan as advised by nutritionist Monitor blood sugars at least 3 times per day Continue to follow up with nutritionist as recommended.  Send message to primary care provider through Mychart regarding new cough symptom.        Plan:The patient has been provided with contact information for the care management team and has been advised to call with any health related questions or concerns.  The care management team will reach out to the patient again over the next 45 days. George Ina RN,BSN,CCM RN Case Manager [place] [phone]

## 2021-06-28 ENCOUNTER — Encounter (HOSPITAL_COMMUNITY): Payer: Self-pay

## 2021-06-28 ENCOUNTER — Inpatient Hospital Stay (HOSPITAL_COMMUNITY)
Admission: EM | Admit: 2021-06-28 | Discharge: 2021-06-30 | DRG: 074 | Disposition: A | Discharge status: Home / Self Care | Payer: 59 | Attending: Family Medicine | Admitting: Family Medicine

## 2021-06-28 ENCOUNTER — Other Ambulatory Visit: Payer: Self-pay

## 2021-06-28 DIAGNOSIS — Z803 Family history of malignant neoplasm of breast: Secondary | ICD-10-CM

## 2021-06-28 DIAGNOSIS — E1110 Type 2 diabetes mellitus with ketoacidosis without coma: Secondary | ICD-10-CM | POA: Diagnosis present

## 2021-06-28 DIAGNOSIS — Z794 Long term (current) use of insulin: Secondary | ICD-10-CM | POA: Diagnosis not present

## 2021-06-28 DIAGNOSIS — K219 Gastro-esophageal reflux disease without esophagitis: Secondary | ICD-10-CM | POA: Diagnosis present

## 2021-06-28 DIAGNOSIS — Z8616 Personal history of COVID-19: Secondary | ICD-10-CM | POA: Diagnosis not present

## 2021-06-28 DIAGNOSIS — D649 Anemia, unspecified: Secondary | ICD-10-CM | POA: Diagnosis present

## 2021-06-28 DIAGNOSIS — E876 Hypokalemia: Secondary | ICD-10-CM | POA: Diagnosis present

## 2021-06-28 DIAGNOSIS — E1159 Type 2 diabetes mellitus with other circulatory complications: Secondary | ICD-10-CM | POA: Diagnosis present

## 2021-06-28 DIAGNOSIS — Z801 Family history of malignant neoplasm of trachea, bronchus and lung: Secondary | ICD-10-CM | POA: Diagnosis not present

## 2021-06-28 DIAGNOSIS — Z79899 Other long term (current) drug therapy: Secondary | ICD-10-CM | POA: Diagnosis not present

## 2021-06-28 DIAGNOSIS — E1010 Type 1 diabetes mellitus with ketoacidosis without coma: Secondary | ICD-10-CM | POA: Diagnosis present

## 2021-06-28 DIAGNOSIS — E1043 Type 1 diabetes mellitus with diabetic autonomic (poly)neuropathy: Secondary | ICD-10-CM | POA: Diagnosis present

## 2021-06-28 DIAGNOSIS — R112 Nausea with vomiting, unspecified: Secondary | ICD-10-CM | POA: Diagnosis not present

## 2021-06-28 DIAGNOSIS — Z8041 Family history of malignant neoplasm of ovary: Secondary | ICD-10-CM | POA: Diagnosis not present

## 2021-06-28 DIAGNOSIS — Z9641 Presence of insulin pump (external) (internal): Secondary | ICD-10-CM | POA: Diagnosis present

## 2021-06-28 DIAGNOSIS — I10 Essential (primary) hypertension: Secondary | ICD-10-CM | POA: Diagnosis present

## 2021-06-28 DIAGNOSIS — N179 Acute kidney failure, unspecified: Secondary | ICD-10-CM | POA: Diagnosis present

## 2021-06-28 DIAGNOSIS — D72829 Elevated white blood cell count, unspecified: Secondary | ICD-10-CM | POA: Diagnosis present

## 2021-06-28 DIAGNOSIS — Z833 Family history of diabetes mellitus: Secondary | ICD-10-CM | POA: Diagnosis not present

## 2021-06-28 DIAGNOSIS — E785 Hyperlipidemia, unspecified: Secondary | ICD-10-CM | POA: Diagnosis present

## 2021-06-28 DIAGNOSIS — E109 Type 1 diabetes mellitus without complications: Secondary | ICD-10-CM | POA: Diagnosis present

## 2021-06-28 DIAGNOSIS — Z800 Family history of malignant neoplasm of digestive organs: Secondary | ICD-10-CM | POA: Diagnosis not present

## 2021-06-28 DIAGNOSIS — K3184 Gastroparesis: Secondary | ICD-10-CM | POA: Diagnosis present

## 2021-06-28 DIAGNOSIS — I152 Hypertension secondary to endocrine disorders: Secondary | ICD-10-CM | POA: Diagnosis present

## 2021-06-28 LAB — COMPREHENSIVE METABOLIC PANEL
ALT: 19 U/L (ref 0–44)
AST: 21 U/L (ref 15–41)
Albumin: 3.4 g/dL — ABNORMAL LOW (ref 3.5–5.0)
Alkaline Phosphatase: 97 U/L (ref 38–126)
Anion gap: 15 (ref 5–15)
BUN: 20 mg/dL (ref 6–20)
CO2: 23 mmol/L (ref 22–32)
Calcium: 9.7 mg/dL (ref 8.9–10.3)
Chloride: 98 mmol/L (ref 98–111)
Creatinine, Ser: 1.15 mg/dL — ABNORMAL HIGH (ref 0.44–1.00)
GFR, Estimated: >60 mL/min (ref >60)
Glucose, Bld: 423 mg/dL — ABNORMAL HIGH (ref 70–99)
Potassium: 3.8 mmol/L (ref 3.5–5.1)
Sodium: 136 mmol/L (ref 135–145)
Total Bilirubin: 0.9 mg/dL (ref 0.3–1.2)
Total Protein: 7.8 g/dL (ref 6.5–8.1)

## 2021-06-28 LAB — RENAL FUNCTION PANEL
Albumin: 3.5 g/dL (ref 3.5–5.0)
Anion gap: 17 — ABNORMAL HIGH (ref 5–15)
BUN: 19 mg/dL (ref 6–20)
CO2: 21 mmol/L — ABNORMAL LOW (ref 22–32)
Calcium: 9.7 mg/dL (ref 8.9–10.3)
Chloride: 100 mmol/L (ref 98–111)
Creatinine, Ser: 1.13 mg/dL — ABNORMAL HIGH (ref 0.44–1.00)
GFR, Estimated: >60 mL/min (ref >60)
Glucose, Bld: 423 mg/dL — ABNORMAL HIGH (ref 70–99)
Phosphorus: 2.6 mg/dL (ref 2.5–4.6)
Potassium: 3.9 mmol/L (ref 3.5–5.1)
Sodium: 138 mmol/L (ref 135–145)

## 2021-06-28 LAB — CBC
HCT: 32.5 % — ABNORMAL LOW (ref 36.0–46.0)
Hemoglobin: 11 g/dL — ABNORMAL LOW (ref 12.0–15.0)
MCH: 29.7 pg (ref 26.0–34.0)
MCHC: 33.8 g/dL (ref 30.0–36.0)
MCV: 87.8 fL (ref 80.0–100.0)
Platelets: 483 10*3/uL — ABNORMAL HIGH (ref 150–400)
RBC: 3.7 MIL/uL — ABNORMAL LOW (ref 3.87–5.11)
RDW: 13.4 % (ref 11.5–15.5)
WBC: 11.8 10*3/uL — ABNORMAL HIGH (ref 4.0–10.5)
nRBC: 0 % (ref 0.0–0.2)

## 2021-06-28 LAB — URINALYSIS, ROUTINE W REFLEX MICROSCOPIC
Bacteria, UA: NONE SEEN
Bilirubin Urine: NEGATIVE
Glucose, UA: >=500 mg/dL — AB
Ketones, ur: 20 mg/dL — AB
Leukocytes,Ua: NEGATIVE
Nitrite: NEGATIVE
Protein, ur: >=300 mg/dL — AB
Specific Gravity, Urine: 1.018 (ref 1.005–1.030)
pH: 6 (ref 5.0–8.0)

## 2021-06-28 LAB — IRON AND TIBC
Iron: 78 ug/dL (ref 28–170)
Saturation Ratios: 15 % (ref 10.4–31.8)
TIBC: 525 ug/dL — ABNORMAL HIGH (ref 250–450)
UIBC: 447 ug/dL

## 2021-06-28 LAB — BASIC METABOLIC PANEL
Anion gap: 10 (ref 5–15)
Anion gap: 9 (ref 5–15)
BUN: 15 mg/dL (ref 6–20)
BUN: 16 mg/dL (ref 6–20)
CO2: 26 mmol/L (ref 22–32)
CO2: 27 mmol/L (ref 22–32)
Calcium: 9.5 mg/dL (ref 8.9–10.3)
Calcium: 9.7 mg/dL (ref 8.9–10.3)
Chloride: 101 mmol/L (ref 98–111)
Chloride: 103 mmol/L (ref 98–111)
Creatinine, Ser: 0.94 mg/dL (ref 0.44–1.00)
Creatinine, Ser: 0.86 mg/dL (ref 0.44–1.00)
GFR, Estimated: >60 mL/min (ref >60)
GFR, Estimated: >60 mL/min (ref >60)
Glucose, Bld: 335 mg/dL — ABNORMAL HIGH (ref 70–99)
Glucose, Bld: 266 mg/dL — ABNORMAL HIGH (ref 70–99)
Potassium: 3.8 mmol/L (ref 3.5–5.1)
Potassium: 3.7 mmol/L (ref 3.5–5.1)
Sodium: 137 mmol/L (ref 135–145)
Sodium: 139 mmol/L (ref 135–145)

## 2021-06-28 LAB — BLOOD GAS, ARTERIAL
Acid-Base Excess: 2.1 mmol/L — ABNORMAL HIGH (ref 0.0–2.0)
Bicarbonate: 27.3 mmol/L (ref 20.0–28.0)
O2 Saturation: 96 %
Patient temperature: 37
pCO2 arterial: 44 mmHg (ref 32–48)
pH, Arterial: 7.4 (ref 7.35–7.45)
pO2, Arterial: 82 mmHg — ABNORMAL LOW (ref 83–108)

## 2021-06-28 LAB — GLUCOSE, CAPILLARY
Glucose-Capillary: 246 mg/dL — ABNORMAL HIGH (ref 70–99)
Glucose-Capillary: 187 mg/dL — ABNORMAL HIGH (ref 70–99)
Glucose-Capillary: 170 mg/dL — ABNORMAL HIGH (ref 70–99)

## 2021-06-28 LAB — CBG MONITORING, ED
Glucose-Capillary: 369 mg/dL — ABNORMAL HIGH (ref 70–99)
Glucose-Capillary: 398 mg/dL — ABNORMAL HIGH (ref 70–99)
Glucose-Capillary: 299 mg/dL — ABNORMAL HIGH (ref 70–99)
Glucose-Capillary: 294 mg/dL — ABNORMAL HIGH (ref 70–99)
Glucose-Capillary: 184 mg/dL — ABNORMAL HIGH (ref 70–99)
Glucose-Capillary: 267 mg/dL — ABNORMAL HIGH (ref 70–99)

## 2021-06-28 LAB — I-STAT BETA HCG BLOOD, ED (MC, WL, AP ONLY): I-stat hCG, quantitative: <5 m[IU]/mL (ref <5)

## 2021-06-28 LAB — MRSA NEXT GEN BY PCR, NASAL: MRSA by PCR Next Gen: NOT DETECTED

## 2021-06-28 LAB — LIPASE, BLOOD: Lipase: 27 U/L (ref 11–51)

## 2021-06-28 LAB — BETA-HYDROXYBUTYRIC ACID: Beta-Hydroxybutyric Acid: 1.08 mmol/L — ABNORMAL HIGH (ref 0.05–0.27)

## 2021-06-28 MED ORDER — HYDROMORPHONE HCL 1 MG/ML IJ SOLN
1.0000 mg | Freq: Once | INTRAMUSCULAR | Status: AC
  Administered 2021-06-28: 1 mg via INTRAVENOUS
  Filled 2021-06-28: qty 1

## 2021-06-28 MED ORDER — DEXTROSE 50 % IV SOLN: 0.0000 mL | INTRAVENOUS | Status: DC | PRN

## 2021-06-28 MED ORDER — DIPHENHYDRAMINE HCL 50 MG/ML IJ SOLN
12.5000 mg | Freq: Once | INTRAMUSCULAR | Status: AC
  Administered 2021-06-28: 12.5 mg via INTRAVENOUS
  Filled 2021-06-28: qty 1

## 2021-06-28 MED ORDER — INSULIN ASPART 100 UNIT/ML IJ SOLN
5.0000 [IU] | Freq: Once | INTRAMUSCULAR | Status: AC
  Administered 2021-06-28: 5 [IU] via SUBCUTANEOUS
  Filled 2021-06-28: qty 0.05

## 2021-06-28 MED ORDER — LACTATED RINGERS IV BOLUS
1000.0000 mL | Freq: Once | INTRAVENOUS | Status: AC
  Administered 2021-06-28: 1000 mL via INTRAVENOUS

## 2021-06-28 MED ORDER — LACTATED RINGERS IV SOLN: INTRAVENOUS | Status: DC

## 2021-06-28 MED ORDER — MORPHINE SULFATE (PF) 2 MG/ML IV SOLN
2.0000 mg | INTRAVENOUS | Status: DC | PRN
  Administered 2021-06-28 – 2021-06-29 (×2): 2 mg via INTRAVENOUS
  Filled 2021-06-28 (×3): qty 1

## 2021-06-28 MED ORDER — METOCLOPRAMIDE HCL 10 MG/10ML PO SOLN
10.0000 mg | Freq: Three times a day (TID) | ORAL | Status: DC
  Administered 2021-06-28 – 2021-07-01 (×10): 10 mg via ORAL
  Filled 2021-06-28 (×13): qty 10

## 2021-06-28 MED ORDER — CHLORHEXIDINE GLUCONATE CLOTH 2 % EX PADS
6.0000 | MEDICATED_PAD | Freq: Every day | CUTANEOUS | Status: DC
  Administered 2021-06-28: 6 via TOPICAL

## 2021-06-28 MED ORDER — LACTATED RINGERS IV BOLUS
1620.0000 mL | Freq: Once | INTRAVENOUS | Status: AC
  Administered 2021-06-28: 1620 mL via INTRAVENOUS

## 2021-06-28 MED ORDER — POTASSIUM CHLORIDE 10 MEQ/100ML IV SOLN
10.0000 meq | INTRAVENOUS | Status: AC
  Administered 2021-06-28 (×2): 10 meq via INTRAVENOUS
  Filled 2021-06-28 (×2): qty 100

## 2021-06-28 MED ORDER — DEXTROSE IN LACTATED RINGERS 5 % IV SOLN: INTRAVENOUS | Status: DC

## 2021-06-28 MED ORDER — METOPROLOL TARTRATE 5 MG/5ML IV SOLN
5.0000 mg | Freq: Four times a day (QID) | INTRAVENOUS | Status: DC | PRN
  Administered 2021-06-28: 5 mg via INTRAVENOUS
  Filled 2021-06-28: qty 5

## 2021-06-28 MED ORDER — INSULIN DETEMIR 100 UNIT/ML ~~LOC~~ SOLN
16.0000 [IU] | Freq: Every day | SUBCUTANEOUS | Status: DC
Start: 2021-06-28 — End: 2021-06-28

## 2021-06-28 MED ORDER — HYDROMORPHONE HCL 1 MG/ML IJ SOLN: 1.0000 mg | Freq: Once | INTRAMUSCULAR | Status: DC

## 2021-06-28 MED ORDER — HYDROMORPHONE HCL 1 MG/ML IJ SOLN
0.5000 mg | Freq: Once | INTRAMUSCULAR | Status: AC
  Administered 2021-06-28: 0.5 mg via INTRAVENOUS
  Filled 2021-06-28: qty 1

## 2021-06-28 MED ORDER — DROPERIDOL 2.5 MG/ML IJ SOLN
1.2500 mg | Freq: Once | INTRAMUSCULAR | Status: AC
  Administered 2021-06-28: 1.25 mg via INTRAVENOUS
  Filled 2021-06-28: qty 2

## 2021-06-28 MED ORDER — PROCHLORPERAZINE EDISYLATE 10 MG/2ML IJ SOLN
10.0000 mg | Freq: Four times a day (QID) | INTRAMUSCULAR | Status: DC | PRN
Start: 2021-06-28 — End: 2021-06-29
  Administered 2021-06-28 – 2021-06-29 (×2): 10 mg via INTRAVENOUS
  Filled 2021-06-28 (×2): qty 2

## 2021-06-28 MED ORDER — PANTOPRAZOLE SODIUM 40 MG IV SOLR
40.0000 mg | Freq: Once | INTRAVENOUS | Status: AC
  Administered 2021-06-28: 40 mg via INTRAVENOUS
  Filled 2021-06-28: qty 10

## 2021-06-28 MED ORDER — HYDRALAZINE HCL 20 MG/ML IJ SOLN
5.0000 mg | Freq: Four times a day (QID) | INTRAMUSCULAR | Status: DC | PRN
  Administered 2021-06-28 – 2021-06-29 (×2): 5 mg via INTRAVENOUS
  Filled 2021-06-28 (×2): qty 1

## 2021-06-28 MED ORDER — PANTOPRAZOLE SODIUM 40 MG IV SOLR
40.0000 mg | INTRAVENOUS | Status: DC
  Administered 2021-06-28 – 2021-06-29 (×2): 40 mg via INTRAVENOUS
  Filled 2021-06-28 (×2): qty 10

## 2021-06-28 MED ORDER — INSULIN ASPART 100 UNIT/ML IJ SOLN
8.0000 [IU] | Freq: Once | INTRAMUSCULAR | Status: DC
  Filled 2021-06-28: qty 0.08

## 2021-06-28 MED ORDER — INSULIN REGULAR(HUMAN) IN NACL 100-0.9 UT/100ML-% IV SOLN
INTRAVENOUS | Status: DC
  Administered 2021-06-28: 9.5 [IU]/h via INTRAVENOUS
  Administered 2021-06-29: 3 [IU]/h via INTRAVENOUS
  Filled 2021-06-28 (×2): qty 100

## 2021-06-28 NOTE — ED Triage Notes (Signed)
Pt reports abdominal pain and N/V. Hx of gastroparesis. ?

## 2021-06-28 NOTE — H&P (Signed)
?History and Physical  ? ? ?Patient: Kelli Hudson SHU:[id number] DOB: 1990/11/22 ?DOA: 06/28/2021 ?DOS: the patient was seen and examined on 06/28/2021 ?PCP: [name], MD  ?Patient coming from: Home ? ?Chief Complaint:  ?Chief Complaint  ?Patient presents with  ? Emesis  ? Abdominal Pain  ? ?HPI: Kelli Hudson is a 31 y.o. female with medical history significant of Dmt1, gastroparesis, HTN. Presenting with N/V. Symptoms started this morning, She tried her normal nausea medications, but they didn't work. She has not had any sick contacts. No diarrhea or fever. When her symptoms didn't quickly resolve, she became concerned and came to the ED. She denies any other aggravating or alleviating factors.   ? ?Review of Systems: As mentioned in the history of present illness. All other systems reviewed and are negative. ?Past Medical History:  ?Diagnosis Date  ? Acute H. pylori gastric ulcer   ? Diabetes mellitus ([place])   ? Diabetic gastroparesis ([place])   ? Gastroparesis   ? GERD (gastroesophageal reflux disease)   ? Hypertension   ? Hyperthyroidism   ? ?Past Surgical History:  ?Procedure Laterality Date  ? AMPUTATION TOE Left 03/10/2018  ? Procedure: AMPUTATION FIFTH TOE;  Surgeon: Trula Slade, DPM;  Location: [place];  Service: Podiatry;  Laterality: Left;  ? BIOPSY  01/28/2020  ? Procedure: BIOPSY;  Surgeon: Otis Brace, MD;  Location: [facility];  Service: Gastroenterology;;  ? ESOPHAGOGASTRODUODENOSCOPY N/A 01/28/2020  ? Procedure: ESOPHAGOGASTRODUODENOSCOPY (EGD);  Surgeon: Otis Brace, MD;  Location: [facility];  Service: Gastroenterology;  Laterality: N/A;  ? ESOPHAGOGASTRODUODENOSCOPY (EGD) WITH PROPOFOL Left 09/08/2015  ? Procedure: ESOPHAGOGASTRODUODENOSCOPY (EGD) WITH PROPOFOL;  Surgeon: Arta Silence, MD;  Location: [facility];  Service: Endoscopy;  Laterality: Left;  ? WISDOM TOOTH EXTRACTION    ? ?Social History:  reports that she has never smoked.  She has never used smokeless tobacco. She reports current alcohol use of about 1.0 standard drink per week. She reports that she does not use drugs. ? ?No Known Allergies ? ?Family History  ?Problem Relation Age of Onset  ? Lung cancer Mother   ? Diabetes Mother   ? Bipolar disorder Father   ? Liver cancer Maternal Grandfather   ? Breast cancer Other   ?     maternal great aunt  ? Heart disease Other   ? Ovarian cancer Other   ?     maternal great aunt  ? Pancreatic cancer Maternal Uncle   ? Prostate cancer Maternal Uncle   ? Diabetes Maternal Aunt   ?     x 2  ? Kidney disease Other   ?     maternal great aunt  ? ? ?Prior to Admission medications   ?Medication Sig Start Date End Date Taking? Authorizing Provider  ?albuterol (VENTOLIN HFA) 108 (90 Base) MCG/ACT inhaler Inhale 2 puffs into the lungs every 6 (six) hours as needed for shortness of breath.    [provider]  ?atorvastatin (LIPITOR) 20 MG tablet Take 1 tablet (20 mg total) by mouth daily. 04/10/21   [name], MD  ?blood glucose meter kit and supplies Dispense based on patient and insurance preference. Use up to four times daily as directed. (FOR ICD-10 E10.9, E11.9). 06/14/21   Shelly Coss, MD  ?Continuous Blood Gluc Receiver (DEXCOM G6 RECEIVER) DEVI USE AS DIRECTED ?Patient taking differently: 1 each by Other route as directed. 11/21/20   [name], MD  ?Continuous Blood Gluc Sensor ([place] G6 SENSOR)  MISC 1 each by Does not apply route daily. 11/21/20   [name], MD  ?Continuous Blood Gluc Transmit (DEXCOM G6 TRANSMITTER) MISC 1 each by Does not apply route daily. 09/24/20   [name], MD  ?dicyclomine (BENTYL) 20 MG tablet Take 1 tablet (20 mg total) by mouth 3 (three) times daily as needed for spasms. 04/24/21   Aline August, MD  ?gabapentin (NEURONTIN) 300 MG capsule Take 1 capsule (300 mg total) by mouth 3 (three) times daily. ?Patient not taking: Reported on 06/19/2021 04/24/21   Aline August, MD  ?glucose blood  (TRUE METRIX BLOOD GLUCOSE TEST) test strip Use as instructed 09/03/20   [name], MD  ?insulin detemir (LEVEMIR FLEXTOUCH) 100 UNIT/ML FlexPen Inject 16 Units into the skin daily. 06/14/21   Shelly Coss, MD  ?Insulin Pen Needle (PEN NEEDLES 3/16") 31G X 5 MM MISC 1 each by Does not apply route in the morning, at noon, and at bedtime. 06/14/21   Shelly Coss, MD  ?insulin regular (NOVOLIN R) 100 units/mL injection Inject 0-0.15 mLs (0-15 Units total) into the skin See admin instructions. Via pump.Start this medication after you follow up with your PCP 06/14/21   Shelly Coss, MD  ?lisinopril (ZESTRIL) 10 MG tablet Take 1 tablet (10 mg total) by mouth daily. 09/27/20   [name], MD  ?metoCLOPramide (REGLAN) 10 MG tablet Take 1 tablet (10 mg total) by mouth every 8 (eight) hours as needed for nausea. 06/14/21 07/14/21  Shelly Coss, MD  ?pantoprazole (PROTONIX) 40 MG tablet Take 1 tablet (40 mg total) by mouth 2 (two) times daily before a meal. 04/24/21   Aline August, MD  ?promethazine (PHENERGAN) 12.5 MG tablet Take 1 tablet (12.5 mg total) by mouth every 6 (six) hours as needed for nausea or vomiting. 04/24/21   Aline August, MD  ?TRUEPLUS LANCETS 28G MISC assist with checking blood sugar TID and qhs ?Patient taking differently: 1 each by Other route See admin instructions. assist with checking blood sugar TID and qhs 09/10/15   Brayton Caves, PA-C  ?insulin aspart (NOVOLOG) 100 UNIT/ML FlexPen Inject 5 Units into the skin 3 (three) times daily with meals. 02/23/18 07/05/19  Donne Hazel, MD  ? ? ?Physical Exam: ?Vitals:  ? 06/28/21 1300 06/28/21 1330 06/28/21 1400 06/28/21 1430  ?BP: (!) 173/108 (!) 183/129 (!) 156/98 (!) 163/107  ?Pulse: (!) 122 (!) 144 (!) 121 (!) 122  ?Resp: _0 ?Temp:      ?TempSrc:      ?SpO2: 99% 100% 98% 99%  ? ?General: 31 y.o. female resting in bed in NAD ?Eyes: PERRL, normal sclera ?ENMT: Nares patent w/o discharge, orophaynx clear, dentition normal, ears  w/o discharge/lesions/ulcers ?Neck: Supple, trachea midline ?Cardiovascular: tachy, +S1, S2, no m/g/r, equal pulses throughout ?Respiratory: CTABL, no w/r/r, normal WOB ?GI: BS+, ND, mild global TTP, no masses noted, no organomegaly noted ?MSK: No e/c/c ?Neuro: A&O x 3, no focal deficits ?Psyc: Appropriate interaction and affect, calm/cooperative ? ?Data Reviewed: ? ?Na+ 138 ?CO2  21 ?Glucose  423 ?Scr  1.13 ?Anion gap 17 ?WBC  11.8 ?Hgb 11 ? ?Assessment and Plan: ?Intractable N/V ?DM gastroparesis ?    - admit to inpt, SDU ?    - anti-emetics, fluids, follow ? ?DKA ?Uncontrolled Dmt1 ?    - start Endotool ?    - follow glucose ?    - NPO except non-caloric fluids ? ?HTN ?    - resume  home regimen when she can take PO ?    - PRN metoprolol for now ? ?HLD ?    - continue home regimen when taking PO again ? ?Normocytic anemia ?    - check iron studies ?    - no evidence of bleed, follow ? ?GERD ?    - protonix ? ?Advance Care Planning:   Code Status: FULL ? ?Consults: None ? ?Family Communication: None at bedside ? ?Severity of Illness: ?The appropriate patient status for this patient is INPATIENT. Inpatient status is judged to be reasonable and necessary in order to provide the required intensity of service to ensure the patient's safety. The patient's presenting symptoms, physical exam findings, and initial radiographic and laboratory data in the context of their chronic comorbidities is felt to place them at high risk for further clinical deterioration. Furthermore, it is not anticipated that the patient will be medically stable for discharge from the hospital within 2 midnights of admission.  ? ?* I certify that at the point of admission it is my clinical judgment that the patient will require inpatient hospital care spanning beyond 2 midnights from the point of admission due to high intensity of service, high risk for further deterioration and high frequency of surveillance required.* ? ?Author: ?Jonnie Finner,  DO ?06/28/2021 2:57 PM ? ?For on call review [email or web address].  ?

## 2021-06-28 NOTE — ED Provider Notes (Signed)
?[facility] DEPT ?Provider Note ? ? ?CSN: [id number] ?Arrival date & time: 06/28/21  1017 ? ?  ? ?History ? ?Chief Complaint  ?Patient presents with  ? Emesis  ? Abdominal Pain  ? ? ?Kelli Hudson is a 31 y.o. female.  Presenting to the emergency room due to concern for nausea and vomiting.  Reports prior history of gastroparesis, diabetes type 1.  States that over the past day or so she has had progressive nausea vomiting and abdominal pain.  Has noted a couple streaks of blood in her vomit.  States pain is similar to prior flares of gastroparesis.  All over, worse in upper abdomen. ? ?Patient has had frequent hospitalizations over the past month.  Last admitted to [facility] on 4/24 for gastroparesis.  Reviewed discharge summary from hospitalist service. ? ?HPI ? ?  ? ?Home Medications ?Prior to Admission medications   ?Medication Sig Start Date End Date Taking? Authorizing Provider  ?albuterol (VENTOLIN HFA) 108 (90 Base) MCG/ACT inhaler Inhale 2 puffs into the lungs every 6 (six) hours as needed for shortness of breath.   Yes [provider]  ?atorvastatin (LIPITOR) 20 MG tablet Take 1 tablet (20 mg total) by mouth daily. 04/10/21  Yes [name], MD  ?dicyclomine (BENTYL) 20 MG tablet Take 1 tablet (20 mg total) by mouth 3 (three) times daily as needed for spasms. 04/24/21  Yes Aline August, MD  ?blood glucose meter kit and supplies Dispense based on patient and insurance preference. Use up to four times daily as directed. (FOR ICD-10 E10.9, E11.9). 06/14/21   Shelly Coss, MD  ?Continuous Blood Gluc Receiver (DEXCOM G6 RECEIVER) DEVI USE AS DIRECTED ?Patient taking differently: 1 each by Other route as directed. 11/21/20   [name], MD  ?Continuous Blood Gluc Sensor (DEXCOM G6 SENSOR) MISC 1 each by Does not apply route daily. 11/21/20   [name], MD  ?Continuous Blood Gluc Transmit (DEXCOM G6 TRANSMITTER) MISC 1 each by Does not apply route  daily. 09/24/20   [name], MD  ?gabapentin (NEURONTIN) 300 MG capsule Take 1 capsule (300 mg total) by mouth 3 (three) times daily. ?Patient not taking: Reported on 06/19/2021 04/24/21   Aline August, MD  ?glucose blood (TRUE METRIX BLOOD GLUCOSE TEST) test strip Use as instructed 09/03/20   [name], MD  ?insulin detemir (LEVEMIR FLEXTOUCH) 100 UNIT/ML FlexPen Inject 16 Units into the skin daily. 06/14/21   Shelly Coss, MD  ?Insulin Pen Needle (PEN NEEDLES 3/16") 31G X 5 MM MISC 1 each by Does not apply route in the morning, at noon, and at bedtime. 06/14/21   Shelly Coss, MD  ?insulin regular (NOVOLIN R) 100 units/mL injection Inject 0-0.15 mLs (0-15 Units total) into the skin See admin instructions. Via pump.Start this medication after you follow up with your PCP 06/14/21   Shelly Coss, MD  ?lisinopril (ZESTRIL) 10 MG tablet Take 1 tablet (10 mg total) by mouth daily. 09/27/20   [name], MD  ?metoCLOPramide (REGLAN) 10 MG tablet Take 1 tablet (10 mg total) by mouth every 8 (eight) hours as needed for nausea. 06/14/21 07/14/21  Shelly Coss, MD  ?pantoprazole (PROTONIX) 40 MG tablet Take 1 tablet (40 mg total) by mouth 2 (two) times daily before a meal. 04/24/21   Aline August, MD  ?promethazine (PHENERGAN) 12.5 MG tablet Take 1 tablet (12.5 mg total) by mouth every 6 (six) hours as needed for nausea or vomiting. 04/24/21  Aline August, MD  ?TRUEPLUS LANCETS 28G MISC assist with checking blood sugar TID and qhs ?Patient taking differently: 1 each by Other route See admin instructions. assist with checking blood sugar TID and qhs 09/10/15   Brayton Caves, PA-C  ?insulin aspart (NOVOLOG) 100 UNIT/ML FlexPen Inject 5 Units into the skin 3 (three) times daily with meals. 02/23/18 07/05/19  Donne Hazel, MD  ?   ? ?Allergies    ?Patient has no known allergies.   ? ?Review of Systems   ?Review of Systems  ?Constitutional:  Negative for chills and fever.  ?HENT:  Negative for ear pain and  sore throat.   ?Eyes:  Negative for pain and visual disturbance.  ?Respiratory:  Negative for cough and shortness of breath.   ?Cardiovascular:  Negative for chest pain and palpitations.  ?Gastrointestinal:  Positive for abdominal pain, nausea and vomiting.  ?Genitourinary:  Negative for dysuria and hematuria.  ?Musculoskeletal:  Negative for arthralgias and back pain.  ?Skin:  Negative for color change and rash.  ?Neurological:  Negative for seizures and syncope.  ?All other systems reviewed and are negative. ? ?Physical Exam ?Updated Vital Signs ?BP (!) 180/114   Pulse (!) 130   Temp 98.2 ?F (36.8 ?C) (Oral)   Resp 18   SpO2 100%  ?Physical Exam ?Vitals and nursing note reviewed.  ?Constitutional:   ?   General: She is not in acute distress. ?   Appearance: She is well-developed.  ?HENT:  ?   Head: Normocephalic and atraumatic.  ?Eyes:  ?   Conjunctiva/sclera: Conjunctivae normal.  ?Cardiovascular:  ?   Rate and Rhythm: Normal rate and regular rhythm.  ?   Heart sounds: No murmur heard. ?Pulmonary:  ?   Effort: Pulmonary effort is normal. No respiratory distress.  ?   Breath sounds: Normal breath sounds.  ?Abdominal:  ?   Palpations: Abdomen is soft.  ?   Tenderness: There is generalized abdominal tenderness. There is no guarding or rebound.  ?Musculoskeletal:     ?   General: No swelling.  ?   Cervical back: Neck supple.  ?Skin: ?   General: Skin is warm and dry.  ?   Capillary Refill: Capillary refill takes less than 2 seconds.  ?Neurological:  ?   Mental Status: She is alert.  ?Psychiatric:     ?   Mood and Affect: Mood normal.  ? ? ?ED Results / Procedures / Treatments   ?Labs ?(all labs ordered are listed, but only abnormal results are displayed) ?Labs Reviewed  ?COMPREHENSIVE METABOLIC PANEL - Abnormal; Notable for the following components:  ?    Result Value  ? Glucose, Bld 423 (*)   ? Creatinine, Ser 1.15 (*)   ? Albumin 3.4 (*)   ? All other components within normal limits  ?CBC - Abnormal; Notable  for the following components:  ? WBC 11.8 (*)   ? RBC 3.70 (*)   ? Hemoglobin 11.0 (*)   ? HCT 32.5 (*)   ? Platelets 483 (*)   ? All other components within normal limits  ?RENAL FUNCTION PANEL - Abnormal; Notable for the following components:  ? CO2 21 (*)   ? Glucose, Bld 423 (*)   ? Creatinine, Ser 1.13 (*)   ? Anion gap 17 (*)   ? All other components within normal limits  ?CBG MONITORING, ED - Abnormal; Notable for the following components:  ? Glucose-Capillary 369 (*)   ? All other components within  normal limits  ?CBG MONITORING, ED - Abnormal; Notable for the following components:  ? Glucose-Capillary 398 (*)   ? All other components within normal limits  ?LIPASE, BLOOD  ?URINALYSIS, ROUTINE W REFLEX MICROSCOPIC  ?BETA-HYDROXYBUTYRIC ACID  ?I-STAT BETA HCG BLOOD, ED ([facility], [facility], [facility] ONLY)  ? ? ?EKG ?EKG Interpretation ? ?Date/Time:  Friday Jun 28 2021 11:34:25 EDT ?Ventricular Rate:  130 ?PR Interval:  157 ?QRS Duration: 78 ?QT Interval:  292 ?QTC Calculation: 430 ?R Axis:   70 ?Text Interpretation: Sinus tachycardia LAE, consider biatrial enlargement Abnormal Q suggests anterior infarct Borderline T abnormalities, lateral leads Baseline wander in lead(s) V4 Confirmed by Madalyn Rob [phone]) on 06/28/2021 12:56:08 PM ? ?Radiology ?No results found. ? ?Procedures ?Procedures  ? ? ?Medications Ordered in ED ?Medications  ?lactated ringers infusion (has no administration in time range)  ?metoCLOPramide (REGLAN) 5 MG/5ML solution 10 mg (10 mg Oral Given 06/28/21 1523)  ?prochlorperazine (COMPAZINE) injection 10 mg (has no administration in time range)  ?HYDROmorphone (DILAUDID) injection 1 mg (1 mg Intravenous Given 06/28/21 1135)  ?droperidol (INAPSINE) 2.5 MG/ML injection 1.25 mg (1.25 mg Intravenous Given 06/28/21 1135)  ?pantoprazole (PROTONIX) injection 40 mg (40 mg Intravenous Given 06/28/21 1135)  ?lactated ringers bolus 1,000 mL (1,000 mLs Intravenous New Bag/Given 06/28/21 1136)  ?insulin aspart (novoLOG)  injection 5 Units (5 Units Subcutaneous Given 06/28/21 1243)  ?HYDROmorphone (DILAUDID) injection 1 mg (1 mg Intravenous Given 06/28/21 1349)  ?lactated ringers bolus 1,000 mL (1,000 mLs Intravenous New Bag/Given 5/12/

## 2021-06-28 NOTE — Progress Notes (Deleted)
An USGPIV (ultrasound guided PIV) has been placed for short-term vasopressor infusion. A correctly placed ivWatch must be used when administering Vasopressors. Should this treatment be needed beyond 72 hours, central line access should be obtained.  It will be the responsibility of the bedside nurse to follow best practice to prevent extravasations.   ?

## 2021-06-28 NOTE — Progress Notes (Addendum)
Inpatient Diabetes Program Recommendations ? ?AACE/ADA: New Consensus Statement on Inpatient Glycemic Control (2015) ? ?Target Ranges:  Prepandial:   less than 140 mg/dL ?     Peak postprandial:   less than 180 mg/dL (1-2 hours) ?     Critically ill patients:  140 - 180 mg/dL  ? ?Lab Results  ?Component Value Date  ? GLUCAP 369 (H) 06/28/2021  ? HGBA1C 9.7 (H) 05/28/2021  ? ? ?Review of Glycemic Control ? Latest Reference Range & Units 06/28/21 10:52  ?Glucose-Capillary 70 - 99 mg/dL 106 (H)  ?(H): Data is abnormally high ? Latest Reference Range & Units 06/28/21 10:30  ?Anion gap 5 - 15  15  ? ? Latest Reference Range & Units 06/28/21 10:30  ?CO2 22 - 32 mmol/L 23  ? ? ?Diabetes history: DM1(does not make insulin.  Needs correction, basal and meal coverage) ? ?Outpatient Diabetes medications: T-slim insulin pump with Novolog, Dexcom CGM ? ?Current orders for Inpatient glycemic control: CBGs ? ?Inpatient Diabetes Program Recommendations:   ? ?Please consider: ? ?-obtaining a Beta-hydroxybutyrate  ?-Novolog 0-9 units Q4H ?-If BHB <0.5 please administer Semglee 16 units (0.2 units/kg); If > 0.5 start ENDOTOOL ? ?Spoke with her at bedside briefly as she is dry heaving.  She took insulin pump off this morning because she was vomiting.  She thinks she ate something spicy.  Sees Dr Lonzo Cloud for ENDO.  Does not know pump settings and pump is at home.  Has had multiple admissions this year.   ? ?Will continue to follow while inpatient. ? ?Thank you, ?Dulce Sellar, MSN, CDCES ?Diabetes Coordinator ?Inpatient Diabetes Program ?[phone] (team pager from 8a-5p) ? ? ? ? ? ? ? ? ?

## 2021-06-29 DIAGNOSIS — R112 Nausea with vomiting, unspecified: Secondary | ICD-10-CM | POA: Diagnosis not present

## 2021-06-29 LAB — GLUCOSE, CAPILLARY
Glucose-Capillary: 119 mg/dL — ABNORMAL HIGH (ref 70–99)
Glucose-Capillary: 121 mg/dL — ABNORMAL HIGH (ref 70–99)
Glucose-Capillary: 123 mg/dL — ABNORMAL HIGH (ref 70–99)
Glucose-Capillary: 133 mg/dL — ABNORMAL HIGH (ref 70–99)
Glucose-Capillary: 139 mg/dL — ABNORMAL HIGH (ref 70–99)
Glucose-Capillary: 147 mg/dL — ABNORMAL HIGH (ref 70–99)
Glucose-Capillary: 149 mg/dL — ABNORMAL HIGH (ref 70–99)
Glucose-Capillary: 159 mg/dL — ABNORMAL HIGH (ref 70–99)
Glucose-Capillary: 162 mg/dL — ABNORMAL HIGH (ref 70–99)
Glucose-Capillary: 182 mg/dL — ABNORMAL HIGH (ref 70–99)
Glucose-Capillary: 184 mg/dL — ABNORMAL HIGH (ref 70–99)
Glucose-Capillary: 187 mg/dL — ABNORMAL HIGH (ref 70–99)
Glucose-Capillary: 188 mg/dL — ABNORMAL HIGH (ref 70–99)
Glucose-Capillary: 195 mg/dL — ABNORMAL HIGH (ref 70–99)
Glucose-Capillary: 196 mg/dL — ABNORMAL HIGH (ref 70–99)
Glucose-Capillary: 201 mg/dL — ABNORMAL HIGH (ref 70–99)

## 2021-06-29 LAB — BASIC METABOLIC PANEL
Anion gap: 10 (ref 5–15)
Anion gap: 7 (ref 5–15)
Anion gap: 7 (ref 5–15)
BUN: 15 mg/dL (ref 6–20)
BUN: 16 mg/dL (ref 6–20)
BUN: 17 mg/dL (ref 6–20)
CO2: 27 mmol/L (ref 22–32)
CO2: 27 mmol/L (ref 22–32)
CO2: 28 mmol/L (ref 22–32)
Calcium: 9.2 mg/dL (ref 8.9–10.3)
Calcium: 9.5 mg/dL (ref 8.9–10.3)
Calcium: 9.6 mg/dL (ref 8.9–10.3)
Chloride: 102 mmol/L (ref 98–111)
Chloride: 104 mmol/L (ref 98–111)
Chloride: 104 mmol/L (ref 98–111)
Creatinine, Ser: 0.77 mg/dL (ref 0.44–1.00)
Creatinine, Ser: 1.1 mg/dL — ABNORMAL HIGH (ref 0.44–1.00)
Creatinine, Ser: 1.14 mg/dL — ABNORMAL HIGH (ref 0.44–1.00)
GFR, Estimated: >60 mL/min (ref >60)
GFR, Estimated: >60 mL/min (ref >60)
GFR, Estimated: >60 mL/min (ref >60)
Glucose, Bld: 149 mg/dL — ABNORMAL HIGH (ref 70–99)
Glucose, Bld: 172 mg/dL — ABNORMAL HIGH (ref 70–99)
Glucose, Bld: 194 mg/dL — ABNORMAL HIGH (ref 70–99)
Potassium: 3 mmol/L — ABNORMAL LOW (ref 3.5–5.1)
Potassium: 3.3 mmol/L — ABNORMAL LOW (ref 3.5–5.1)
Potassium: 3.4 mmol/L — ABNORMAL LOW (ref 3.5–5.1)
Sodium: 138 mmol/L (ref 135–145)
Sodium: 139 mmol/L (ref 135–145)
Sodium: 139 mmol/L (ref 135–145)

## 2021-06-29 LAB — CBC
HCT: 32.9 % — ABNORMAL LOW (ref 36.0–46.0)
Hemoglobin: 11 g/dL — ABNORMAL LOW (ref 12.0–15.0)
MCH: 29.4 pg (ref 26.0–34.0)
MCHC: 33.4 g/dL (ref 30.0–36.0)
MCV: 88 fL (ref 80.0–100.0)
Platelets: 392 10*3/uL (ref 150–400)
RBC: 3.74 MIL/uL — ABNORMAL LOW (ref 3.87–5.11)
RDW: 13.7 % (ref 11.5–15.5)
WBC: 15.6 10*3/uL — ABNORMAL HIGH (ref 4.0–10.5)
nRBC: 0 % (ref 0.0–0.2)

## 2021-06-29 LAB — BETA-HYDROXYBUTYRIC ACID
Beta-Hydroxybutyric Acid: 0.58 mmol/L — ABNORMAL HIGH (ref 0.05–0.27)
Beta-Hydroxybutyric Acid: 0.58 mmol/L — ABNORMAL HIGH (ref 0.05–0.27)

## 2021-06-29 LAB — MAGNESIUM: Magnesium: 1.3 mg/dL — ABNORMAL LOW (ref 1.7–2.4)

## 2021-06-29 MED ORDER — POTASSIUM CHLORIDE IN NACL 40-0.9 MEQ/L-% IV SOLN
INTRAVENOUS | Status: DC
  Filled 2021-06-29 (×3): qty 1000

## 2021-06-29 MED ORDER — LACTATED RINGERS IV BOLUS
250.0000 mL | Freq: Once | INTRAVENOUS | Status: AC
  Administered 2021-06-29: 250 mL via INTRAVENOUS

## 2021-06-29 MED ORDER — METOCLOPRAMIDE HCL 5 MG/ML IJ SOLN
INTRAMUSCULAR | Status: AC
  Filled 2021-06-29: qty 2

## 2021-06-29 MED ORDER — MAGNESIUM SULFATE 4 GM/100ML IV SOLN
4.0000 g | Freq: Once | INTRAVENOUS | Status: AC
  Administered 2021-06-29: 4 g via INTRAVENOUS
  Filled 2021-06-29: qty 100

## 2021-06-29 MED ORDER — HYDROMORPHONE HCL 1 MG/ML IJ SOLN
1.0000 mg | Freq: Once | INTRAMUSCULAR | Status: AC
  Administered 2021-06-29: 1 mg via INTRAVENOUS
  Filled 2021-06-29: qty 1

## 2021-06-29 MED ORDER — ENOXAPARIN SODIUM 40 MG/0.4ML IJ SOSY
40.0000 mg | PREFILLED_SYRINGE | Freq: Every day | INTRAMUSCULAR | Status: DC
  Administered 2021-06-29 – 2021-07-01 (×3): 40 mg via SUBCUTANEOUS
  Filled 2021-06-29 (×3): qty 0.4

## 2021-06-29 MED ORDER — POTASSIUM CHLORIDE CRYS ER 20 MEQ PO TBCR
40.0000 meq | EXTENDED_RELEASE_TABLET | Freq: Every day | ORAL | Status: DC
  Administered 2021-06-29: 40 meq via ORAL
  Filled 2021-06-29: qty 2

## 2021-06-29 MED ORDER — INSULIN DETEMIR 100 UNIT/ML ~~LOC~~ SOLN
8.0000 [IU] | SUBCUTANEOUS | Status: DC
  Administered 2021-06-29 – 2021-07-01 (×3): 8 [IU] via SUBCUTANEOUS
  Filled 2021-06-29 (×3): qty 0.08

## 2021-06-29 MED ORDER — DIPHENHYDRAMINE HCL 50 MG/ML IJ SOLN: 12.5000 mg | Freq: Four times a day (QID) | INTRAMUSCULAR | Status: DC | PRN

## 2021-06-29 MED ORDER — MAGNESIUM SULFATE 2 GM/50ML IV SOLN
2.0000 g | Freq: Once | INTRAVENOUS | Status: DC
  Filled 2021-06-29: qty 50

## 2021-06-29 MED ORDER — MORPHINE SULFATE (PF) 2 MG/ML IV SOLN
2.0000 mg | INTRAVENOUS | Status: DC | PRN
  Administered 2021-06-29: 2 mg via INTRAVENOUS

## 2021-06-29 MED ORDER — PROCHLORPERAZINE EDISYLATE 10 MG/2ML IJ SOLN: 10.0000 mg | Freq: Four times a day (QID) | INTRAMUSCULAR | Status: DC | PRN

## 2021-06-29 MED ORDER — INSULIN ASPART 100 UNIT/ML IJ SOLN
0.0000 [IU] | Freq: Three times a day (TID) | INTRAMUSCULAR | Status: DC
  Administered 2021-06-29: 2 [IU] via SUBCUTANEOUS
  Administered 2021-06-29: 1 [IU] via SUBCUTANEOUS
  Administered 2021-06-30: 2 [IU] via SUBCUTANEOUS
  Administered 2021-06-30: 3 [IU] via SUBCUTANEOUS
  Administered 2021-06-30 – 2021-07-01 (×2): 2 [IU] via SUBCUTANEOUS
  Administered 2021-07-01: 3 [IU] via SUBCUTANEOUS

## 2021-06-29 MED ORDER — INSULIN ASPART 100 UNIT/ML IJ SOLN
3.0000 [IU] | Freq: Three times a day (TID) | INTRAMUSCULAR | Status: DC
  Administered 2021-06-29 – 2021-07-01 (×7): 3 [IU] via SUBCUTANEOUS

## 2021-06-29 MED ORDER — MAGNESIUM SULFATE 4 GM/100ML IV SOLN
4.0000 g | Freq: Once | INTRAVENOUS | Status: DC
  Filled 2021-06-29: qty 100

## 2021-06-29 MED ORDER — HYDROMORPHONE HCL 1 MG/ML IJ SOLN
1.0000 mg | INTRAMUSCULAR | Status: DC | PRN
  Administered 2021-06-29 – 2021-06-30 (×6): 1 mg via INTRAVENOUS
  Filled 2021-06-29 (×6): qty 1

## 2021-06-29 NOTE — Progress Notes (Signed)
?PROGRESS NOTE ? ? Kelli Hudson  [id number] DOB: Feb 18, 1991 DOA: 06/28/2021 ?PCP: [name], MD  ?Brief Narrative:  ?31 year old black female known history DM TY 1 since 2008 [last A1c 9.7] ?(Has tandem insulin pump  since thee past year followed by Dr. Kelton Pillar) + gastroparesis, HTN, HLD, prior left foot ulcer managed previously by podiatry ?Covid recovered 05/2020 ? ?Presented to emergency room 5/12, ABD pain, N/V-intractable ?Last admitted for DM gastroparesis 4/24-4/28 ?Being treated currently for DKA AKI ? ? ?Hospital-Problem based course ? ?DKA superimposed on DM TY 1 since 2008 last A1c 9.7 ?Once physiology clears-start back SSI with basal bolus ?She is still quite nauseous and has vomited clear emesis although had a little bit of blood in her emesis yesterday--I think this is transitory ?Hopefully she can be seen by her endocrinologist in the outpatient setting, get a stomach emptying study when she is at stable state and follow-up ?AKI on admission ?Hypomagnesemia, hypokalemia ?Probably secondary to loss of succus-replace aggressively 4 g of IV mag, include potassium and fluids and replace with that ?Labs in a.m. ?Gastroparesis sinus tachycardia ?Significant pain probably from her gastroparesis etc.-continue Reglan 10 every 8, Protonix 40 IV every 24 ?Pain control morphine 2 mg every 2 as needed and Dilaudid 1 mg every 3 as needed for now-try to avoid opiates (was given droperidol?-Would not resume the same for now given prolonged QTc) ?Continue Benadryl injections 12.5 every 6 as needed ?Leukocytosis ?Probably stress response no fever-follow in the morning ? ?DVT prophylaxis: Lovenox ?Code Status: Full ?Family Communication: None present ?Disposition:  ?Status is: Inpatient ?Remains inpatient appropriate because: Still unstable and needs monitoring ?  ?Consultants:  ? ? ?Procedures:  ? ?Antimicrobials:   ? ? ?Subjective: ?Awake coherent no distress but seems to be in some amount of  abdominal pain-has vomited x2 ?No chest pain no fever ?Passing gas ? ?Objective: ?Vitals:  ? 06/29/21 0200 06/29/21 0300 06/29/21 0304 06/29/21 0600  ?BP: (!) 97/40  (!) 112/50   ?Pulse: (!) 114  (!) 110   ?Resp: 11  12   ?Temp:  98.2 ?F (36.8 ?C)    ?TempSrc:  Oral    ?SpO2: 97%  96%   ?Weight:    83.7 kg  ?Height:    5\' 8"  (1.727 m)  ? ? ?Intake/Output Summary (Last 24 hours) at 06/29/2021 0737 ?Last data filed at 06/29/2021 [id number] ?Gross per 24 hour  ?Intake 5239.33 ml  ?Output 800 ml  ?Net 4439.33 ml  ? ?Filed Weights  ? 06/29/21 0600  ?Weight: 83.7 kg  ? ? ?Examination: ? ?Slightly ill-appearing black female no distress looks about stated age no icterus no pallor ?[place] dry ?Chest is clear no rales rhonchi wheeze ?Abdomen slightly distended some tenderness in epigastrium ?No lower extremity edema ROM intact-quite sleepy ? ?Data Reviewed: personally reviewed  ? ?CBC ?   ?Component Value Date/Time  ? WBC 11.8 (H) 06/28/2021 1030  ? RBC 3.70 (L) 06/28/2021 1030  ? HGB 11.0 (L) 06/28/2021 1030  ? HGB 10.1 (L) 02/04/2021 1539  ? HCT 32.5 (L) 06/28/2021 1030  ? HCT 30.0 (L) 02/04/2021 1539  ? PLT 483 (H) 06/28/2021 1030  ? PLT 386 02/04/2021 1539  ? MCV 87.8 06/28/2021 1030  ? MCV 87 02/04/2021 1539  ? MCH 29.7 06/28/2021 1030  ? MCHC 33.8 06/28/2021 1030  ? RDW 13.4 06/28/2021 1030  ? RDW 12.9 02/04/2021 1539  ? LYMPHSABS 2.7 06/10/2021 1411  ? LYMPHSABS 2.9 02/04/2021 1539  ? MONOABS 0.6  06/10/2021 1411  ? EOSABS 0.2 06/10/2021 1411  ? EOSABS 0.1 02/04/2021 1539  ? BASOSABS 0.0 06/10/2021 1411  ? BASOSABS 0.0 02/04/2021 1539  ? ? ?  Latest Ref Rng & Units 06/29/2021  ?  3:41 AM 06/29/2021  ? 12:13 AM 06/28/2021  ?  8:06 PM  ?CMP  ?Glucose 70 - 99 mg/dL 149   194   266    ?BUN 6 - 20 mg/dL 17   15   16     ?Creatinine 0.44 - 1.00 mg/dL 1.10   0.77   0.86    ?Sodium 135 - 145 mmol/L 138   139   139    ?Potassium 3.5 - 5.1 mmol/L 3.3   3.4   3.7    ?Chloride 98 - 111 mmol/L 104   104   103    ?CO2 22 - 32 mmol/L 27   28    27     ?Calcium 8.9 - 10.3 mg/dL 9.2   9.5   9.7    ? ? ? ?Radiology Studies: ?No results found. ? ? ?Scheduled Meds: ? Chlorhexidine Gluconate Cloth  6 each Topical Daily  ? enoxaparin (LOVENOX) injection  40 mg Subcutaneous Daily  ? insulin aspart  0-9 Units Subcutaneous TID WC  ? insulin aspart  3 Units Subcutaneous TID WC  ? insulin detemir  8 Units Subcutaneous Q24H  ? metoCLOPramide  10 mg Oral Q8H  ? pantoprazole (PROTONIX) IV  40 mg Intravenous Q24H  ? potassium chloride  40 mEq Oral Daily  ? ?Continuous Infusions: ? 0.9 % NaCl with KCl 40 mEq / L    ? dextrose 5% lactated ringers 125 mL/hr at 06/29/21 1115  ? insulin 5 Units/hr (06/29/21 1115)  ? lactated ringers    ? magnesium sulfate bolus IVPB 50 mL/hr at 06/29/21 1115  ? ? ? LOS: 1 day  ? ?Time spent: 37 minutes ? ?Nita Sells, MD ?[facility] ?To contact the attending provider between 7A-7P or the covering provider during after hours 7P-7A, please log into the web site [email or web address] and access using universal [place] password for that web site. If you do not have the password, please call the hospital operator. ? ?06/29/2021, 7:37 AM  ? ? ?

## 2021-06-29 NOTE — Plan of Care (Signed)
?  Problem: Education: ?Goal: Knowledge of General Education information will improve ?Description: Including pain rating scale, medication(s)/side effects and non-pharmacologic comfort measures ?Outcome: Progressing ?  ?Problem: Clinical Measurements: ?Goal: Respiratory complications will improve ?Outcome: Progressing ?  ?Problem: Metabolic: ?Goal: Ability to maintain appropriate glucose levels will improve ?Outcome: Progressing ?  ?Problem: Nutrition: ?Goal: Adequate nutrition will be maintained ?Outcome: Not Progressing ?  ?Problem: Pain Managment: ?Goal: General experience of comfort will improve ?Outcome: Not Progressing ?  ?Problem: Nutritional: ?Goal: Maintenance of adequate nutrition will improve ?Outcome: Not Progressing ?  ?

## 2021-06-30 DIAGNOSIS — R112 Nausea with vomiting, unspecified: Secondary | ICD-10-CM | POA: Diagnosis not present

## 2021-06-30 LAB — GLUCOSE, CAPILLARY
Glucose-Capillary: 173 mg/dL — ABNORMAL HIGH (ref 70–99)
Glucose-Capillary: 176 mg/dL — ABNORMAL HIGH (ref 70–99)
Glucose-Capillary: 201 mg/dL — ABNORMAL HIGH (ref 70–99)
Glucose-Capillary: 188 mg/dL — ABNORMAL HIGH (ref 70–99)

## 2021-06-30 LAB — BASIC METABOLIC PANEL
Anion gap: 3 — ABNORMAL LOW (ref 5–15)
BUN: 16 mg/dL (ref 6–20)
CO2: 27 mmol/L (ref 22–32)
Calcium: 8.8 mg/dL — ABNORMAL LOW (ref 8.9–10.3)
Chloride: 109 mmol/L (ref 98–111)
Creatinine, Ser: 0.99 mg/dL (ref 0.44–1.00)
GFR, Estimated: >60 mL/min (ref >60)
Glucose, Bld: 124 mg/dL — ABNORMAL HIGH (ref 70–99)
Potassium: 5 mmol/L (ref 3.5–5.1)
Sodium: 139 mmol/L (ref 135–145)

## 2021-06-30 LAB — CBC
HCT: 30.4 % — ABNORMAL LOW (ref 36.0–46.0)
Hemoglobin: 9.8 g/dL — ABNORMAL LOW (ref 12.0–15.0)
MCH: 29.7 pg (ref 26.0–34.0)
MCHC: 32.2 g/dL (ref 30.0–36.0)
MCV: 92.1 fL (ref 80.0–100.0)
Platelets: 350 10*3/uL (ref 150–400)
RBC: 3.3 MIL/uL — ABNORMAL LOW (ref 3.87–5.11)
RDW: 14 % (ref 11.5–15.5)
WBC: 13.4 10*3/uL — ABNORMAL HIGH (ref 4.0–10.5)
nRBC: 0 % (ref 0.0–0.2)

## 2021-06-30 MED ORDER — OXYCODONE-ACETAMINOPHEN 7.5-325 MG PO TABS
1.0000 | ORAL_TABLET | ORAL | Status: DC | PRN
Start: 2021-06-30 — End: 2021-07-01
  Administered 2021-06-30: 1 via ORAL
  Filled 2021-06-30 (×2): qty 1

## 2021-06-30 MED ORDER — PANTOPRAZOLE SODIUM 40 MG PO TBEC
40.0000 mg | DELAYED_RELEASE_TABLET | Freq: Two times a day (BID) | ORAL | Status: DC
  Administered 2021-06-30 – 2021-07-01 (×3): 40 mg via ORAL
  Filled 2021-06-30 (×3): qty 1

## 2021-06-30 MED ORDER — ADULT MULTIVITAMIN W/MINERALS CH
1.0000 | ORAL_TABLET | Freq: Every day | ORAL | Status: DC
  Administered 2021-06-30 – 2021-07-01 (×2): 1 via ORAL
  Filled 2021-06-30 (×2): qty 1

## 2021-06-30 MED ORDER — TRAZODONE HCL 50 MG PO TABS
50.0000 mg | ORAL_TABLET | Freq: Once | ORAL | Status: AC
  Administered 2021-06-30: 50 mg via ORAL
  Filled 2021-06-30: qty 1

## 2021-06-30 MED ORDER — CARVEDILOL 6.25 MG PO TABS
6.2500 mg | ORAL_TABLET | Freq: Two times a day (BID) | ORAL | Status: DC
Start: 2021-06-30 — End: 2021-07-01
  Administered 2021-06-30 – 2021-07-01 (×2): 6.25 mg via ORAL
  Filled 2021-06-30 (×2): qty 1

## 2021-06-30 MED ORDER — ENSURE ENLIVE PO LIQD: 237.0000 mL | Freq: Two times a day (BID) | ORAL | Status: DC

## 2021-06-30 MED ORDER — HYDROMORPHONE HCL 1 MG/ML IJ SOLN
1.0000 mg | INTRAMUSCULAR | Status: DC | PRN
  Administered 2021-06-30 – 2021-07-01 (×4): 1 mg via INTRAVENOUS
  Filled 2021-06-30 (×4): qty 1

## 2021-06-30 MED ORDER — TRAZODONE HCL 50 MG PO TABS: 50.0000 mg | ORAL_TABLET | Freq: Every day | ORAL | Status: DC

## 2021-06-30 NOTE — Progress Notes (Signed)
Initial Nutrition Assessment ? ?DOCUMENTATION CODES:  ? ?Not applicable ? ?INTERVENTION:  ? ?Multivitamin w/ minerals daily ?Ensure Enlive po BID, each supplement provides 350 kcal and 20 grams of protein. ?Encourage good PO intake ? ?NUTRITION DIAGNOSIS:  ? ?Inadequate oral intake related to nausea, vomiting as evidenced by per patient/family report. ? ?GOAL:  ? ?Patient will meet greater than or equal to 90% of their needs ? ?MONITOR:  ? ?PO intake, Supplement acceptance, Labs, Weight trends ? ?REASON FOR ASSESSMENT:  ? ?Malnutrition Screening Tool ?  ? ?ASSESSMENT:  ? ?31 y.o. female presented to the ED with nausea and vomiting. Pt with history of gastroparesis; pt attempted her regimen with no improvement resulting in her presentation. PMH includes T1DM, gastroparesis, HTN, and GERD. Pt admitted with DM gastroparesis and DKA 2/2 uncontrolled T1DM.  ? ?RD working remotely at this time; pt currently located in the ICU. ? ?Per MD note, pt only wishes for liquids today and does not want to try solids.  ?No meal intakes have been recorded within EMR. ? ?Per EMR, pt has had a 7% weight loss within 3 months, this is not clinically significant for time frame. ? ?RD to order pt Ensure, per previous RD evaluations pt enjoys them and drinks them at home.  ? ?Medications reviewed and include: SSI 0-9 units TID + 3 units TID, Levemir, Reglan, Protonix ?Labs reviewed: Hgb A1c 9.7% (05/28/21), 24 hr CBG  133-196 ? ?NUTRITION - FOCUSED PHYSICAL EXAM: ? ?Deferred to follow-up. ? ?Diet Order:   ?Diet Order   ? ?       ?  Diet Carb Modified Fluid consistency: Thin; Room service appropriate? Yes  Diet effective now       ?  ? ?  ?  ? ?  ? ? ?EDUCATION NEEDS:  ? ?No education needs have been identified at this time ? ?Skin:  Skin Assessment: Reviewed RN Assessment ? ?Last BM:  5/9 ? ?Height:  ? ?Ht Readings from Last 1 Encounters:  ?06/29/21 5\' 8"  (1.727 m)  ? ? ?Weight:  ? ?Wt Readings from Last 1 Encounters:  ?06/29/21 83.7 kg   ? ? ?Ideal Body Weight:  63.6 kg ? ?BMI:  Body mass index is 28.06 kg/m?. ? ?Estimated Nutritional Needs:  ? ?Kcal:  2100-2300 ? ?Protein:  105-120 grams ? ?Fluid:  >/= 2 L ? ? ? ?07/01/21 RD, LDN ?Clinical Dietitian ?See AM[facility] for contact information.  ?

## 2021-06-30 NOTE — Progress Notes (Signed)

## 2021-06-30 NOTE — Progress Notes (Signed)
?PROGRESS NOTE ? ? Kelli Hudson  WUX:[id number] DOB: 01-10-1991 DOA: 06/28/2021 ?PCP: Loyola Mast, MD  ?Brief Narrative:  ? ?31 year old black female known history DM TY 1 since 2008 [last A1c 9.7] ?(Has tandem insulin pump  since thee past year followed by Dr. Lonzo Cloud) + gastroparesis, HTN, HLD, prior left foot ulcer managed previously by podiatry ?Covid recovered 05/2020 ? ?Presented to emergency room 5/12, ABD pain, N/V-intractable ?Last admitted for DM gastroparesis 4/24-4/28 ?Being treated currently for DKA AKI ? ? ?Hospital-Problem based course ? ?DKA superimposed on DM TY 1 since 2008 last A1c 9.7 ?Physiology resolved--qid ac 124-180 ?Needs stomach emptying study when she is at stable state and follow-up ?AKI on admission ?Hypomagnesemia, hypokalemia resolved ?Stop oral and IV K replacement ? sinus tachycardia ?Per OV 03/15/2020 = M and G goiter-2 of the nodules meet criteria for FNA but she was lost to follow-up ?Start coreg ?Because she was lost to follow-up may need to initiate work-up here?--will ask Dr. Lonzo Cloud in am=--sent Inbox message today ?TSH last done 02/26/20= 0.882 ?Gastroparesis ?Significant pain probably from her gastroparesis etc.-only wishes  liquids  ?Continuing Reglan 10 every 8, Protonix 40 IV every 24 ?Pain control Dilaudid 1 mg every 3 as needed for now-try to avoid opiates --encourage orals-she has been encouraged to eat also ?Continue Benadryl injections 12.5 every 6 as needed ?Leukocytosis ?Probably stress response no fever-no work-up at this time. ? ?DVT prophylaxis: Lovenox ?Code Status: Full ?Family Communication: None present ?Disposition:  ?Status is: Inpatient ?Remains inpatient appropriate because: Still unstable and needs monitoring ?  ?Consultants:  ? ? ?Procedures:  ? ?Antimicrobials:   ? ? ?Subjective: ?Does not wish to have solids today-taking expressly Dilaudid every 3 as needed for pain-mention to her she needs to gradually diet-also mentioned we will be  using oral opiates at this time ? ?Objective: ?Vitals:  ? 06/30/21 0600 06/30/21 0700 06/30/21 0800 06/30/21 0807  ?BP: (!) 110/57 (!) 110/46 (!) 141/113   ?Pulse: (!) 111 (!) 114 (!) 125   ?Resp: 10 14 10    ?Temp:    98.3 ?F (36.8 ?C)  ?TempSrc:    Oral  ?SpO2: 93% 94% 98%   ?Weight:      ?Height:      ? ? ?Intake/Output Summary (Last 24 hours) at 06/30/2021 0842 ?Last data filed at 06/30/2021 07/02/2021 ?Gross per 24 hour  ?Intake 3152.76 ml  ?Output --  ?Net 3152.76 ml  ? ? ?Filed Weights  ? 06/29/21 0600  ?Weight: 83.7 kg  ? ? ?Examination: ? ?Sleepy black female no distress no icterus no pallor ?S1-S2 tachycardic 100 range ?Abdomen is slightly tender in epigastrium no lower quadrant discomfort or rebound ?No lower extremity swelling ?Neurologically intact ?Psych anxious ? ? ?Data Reviewed: personally reviewed  ? ?CBC ?   ?Component Value Date/Time  ? WBC 13.4 (H) 06/30/2021 0257  ? RBC 3.30 (L) 06/30/2021 0257  ? HGB 9.8 (L) 06/30/2021 0257  ? HGB 10.1 (L) 02/04/2021 1539  ? HCT 30.4 (L) 06/30/2021 0257  ? HCT 30.0 (L) 02/04/2021 1539  ? PLT 350 06/30/2021 0257  ? PLT 386 02/04/2021 1539  ? MCV 92.1 06/30/2021 0257  ? MCV 87 02/04/2021 1539  ? MCH 29.7 06/30/2021 0257  ? MCHC 32.2 06/30/2021 0257  ? RDW 14.0 06/30/2021 0257  ? RDW 12.9 02/04/2021 1539  ? LYMPHSABS 2.7 06/10/2021 1411  ? LYMPHSABS 2.9 02/04/2021 1539  ? MONOABS 0.6 06/10/2021 1411  ? EOSABS 0.2 06/10/2021 1411  ? EOSABS  0.1 02/04/2021 1539  ? BASOSABS 0.0 06/10/2021 1411  ? BASOSABS 0.0 02/04/2021 1539  ? ? ?  Latest Ref Rng & Units 06/30/2021  ?  2:57 AM 06/29/2021  ?  8:04 AM 06/29/2021  ?  3:41 AM  ?CMP  ?Glucose 70 - 99 mg/dL 937   902   409    ?BUN 6 - 20 mg/dL 16   16   17     ?Creatinine 0.44 - 1.00 mg/dL   7.35   3.29    ?Sodium 135 - 145 mmol/L 139   139   138    ?Potassium 3.5 - 5.1 mmol/L 5.0   3.0   3.3    ?Chloride 98 - 111 mmol/L 109   102   104    ?CO2 22 - 32 mmol/L 27   27   27     ?Calcium 8.9 - 10.3 mg/dL 8.8   9.6   9.2     ? ? ? ?Radiology Studies: ?No results found. ? ? ?Scheduled Meds: ? carvedilol  6.25 mg Oral BID WC  ? Chlorhexidine Gluconate Cloth  6 each Topical Daily  ? enoxaparin (LOVENOX) injection  40 mg Subcutaneous Daily  ? insulin aspart  0-9 Units Subcutaneous TID WC  ? insulin aspart  3 Units Subcutaneous TID WC  ? insulin detemir  8 Units Subcutaneous Q24H  ? metoCLOPramide  10 mg Oral Q8H  ? pantoprazole (PROTONIX) IV  40 mg Intravenous Q24H  ? ?Continuous Infusions: ? insulin Stopped (06/29/21 1335)  ? ? ? LOS: 2 days  ? ?Time spent: 27 minutes ? ? , MD ?[facility] ?To contact the attending provider between 7A-7P or the covering provider during after hours 7P-7A, please log into the web site [email or web address] and access using universal [place] password for that web site. If you do not have the password, please call the hospital operator. ? ?06/30/2021, 8:42 AM  ? ? ?

## 2021-07-01 LAB — GLUCOSE, CAPILLARY
Glucose-Capillary: 210 mg/dL — ABNORMAL HIGH (ref 70–99)
Glucose-Capillary: 178 mg/dL — ABNORMAL HIGH (ref 70–99)

## 2021-07-01 LAB — CBC
HCT: 33.3 % — ABNORMAL LOW (ref 36.0–46.0)
Hemoglobin: 10.6 g/dL — ABNORMAL LOW (ref 12.0–15.0)
MCH: 29.3 pg (ref 26.0–34.0)
MCHC: 31.8 g/dL (ref 30.0–36.0)
MCV: 92 fL (ref 80.0–100.0)
Platelets: 361 10*3/uL (ref 150–400)
RBC: 3.62 MIL/uL — ABNORMAL LOW (ref 3.87–5.11)
RDW: 13.3 % (ref 11.5–15.5)
WBC: 8.8 10*3/uL (ref 4.0–10.5)
nRBC: 0 % (ref 0.0–0.2)

## 2021-07-01 LAB — COMPREHENSIVE METABOLIC PANEL
ALT: 14 U/L (ref 0–44)
AST: 17 U/L (ref 15–41)
Albumin: 2.7 g/dL — ABNORMAL LOW (ref 3.5–5.0)
Alkaline Phosphatase: 73 U/L (ref 38–126)
Anion gap: 7 (ref 5–15)
BUN: 14 mg/dL (ref 6–20)
CO2: 25 mmol/L (ref 22–32)
Calcium: 9 mg/dL (ref 8.9–10.3)
Chloride: 101 mmol/L (ref 98–111)
Creatinine, Ser: 1.03 mg/dL — ABNORMAL HIGH (ref 0.44–1.00)
GFR, Estimated: >60 mL/min (ref >60)
Glucose, Bld: 193 mg/dL — ABNORMAL HIGH (ref 70–99)
Potassium: 4.8 mmol/L (ref 3.5–5.1)
Sodium: 133 mmol/L — ABNORMAL LOW (ref 135–145)
Total Bilirubin: 0.7 mg/dL (ref 0.3–1.2)
Total Protein: 6.1 g/dL — ABNORMAL LOW (ref 6.5–8.1)

## 2021-07-01 MED ORDER — PROMETHAZINE HCL 12.5 MG PO TABS: 12.5000 mg | ORAL_TABLET | Freq: Four times a day (QID) | ORAL | 0 refills | Status: DC | PRN

## 2021-07-01 MED ORDER — CARVEDILOL 6.25 MG PO TABS: 6.2500 mg | ORAL_TABLET | Freq: Two times a day (BID) | ORAL | 2 refills | Status: DC

## 2021-07-01 NOTE — Discharge Summary (Signed)
Physician Discharge Summary  ?Kelli Hudson QZE:[id number] DOB: 06-Jun-1990 DOA: 06/28/2021 ? ?PCP: [name], MD ? ?Admit date: 06/28/2021 ?Discharge date: 07/01/2021 ? ?Time spent: 33 minutes ? ?Recommendations for Outpatient Follow-up:  ?Will need close follow-up with PCP/endocrinologist-has ACR TI RADS risk category TR 5 nodules in her thyroid-although TSH is normal may benefit from TSH/T4/T3 and FNA when available of these notes ?Recommend continuation of insulin, insulin pump ?Note medication change lisinopril-->Coreg secondary to sinus tachycardia which was unexplained this hospitalization ?Recommend gastric emptying study to be coordinated by PCP ? ?Discharge Diagnoses:  ?MAIN problem for hospitalization  ? 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 ?Please see below for itemized issues addressed in HOpsital- ?refer to other progress notes for clarity if needed ? ?Discharge Condition: Improved ? ?Diet recommendation: Diabetic ? ?Filed Weights  ? 06/29/21 0600  ?Weight: 83.7 kg  ? ? ?History of present illness:  ?31 year old black female known history DM TY 1 since 2008 [last A1c 9.7] ?(Has tandem insulin pump  since thee past year followed by Dr. Kelton Pillar) + gastroparesis, HTN, HLD, prior left foot ulcer managed previously by podiatry ?Covid recovered 05/2020 ?  ?Presented to emergency room 5/12, ABD pain, N/V-intractable ?Last admitted for DM gastroparesis 4/24-4/28 ?Being treated currently for DKA AKI ? ?Hospital Course:  ?DKA superimposed on DM TY 1 since 2008 last A1c 9.7 ?Physiology resolved--CBG stabilized during hospitalization ?Patient confirms she has a continuous glucometer with supplies and will discharge on her home settings ?AKI on admission ?Hypomagnesemia, hypokalemia resolved ?All normalized with IV and p.o. replacement ? sinus tachycardia ?Per OV 03/15/2020 = M and G goiter-2 of the nodules meet criteria for FNA but she was lost to follow-up ?Start coreg on  discharge ?Outpatient follow-up Dr. Kelton Pillar ? needs TFTs in about 1 week as an outpatient-hesitated to order the same care given DKA physiology and likelihood of inaccuracy of results ?TSH last done 02/26/20= 0.882 ?Gastroparesis ?Significant pain probably from her gastroparesis etc.-only wishes  liquids  ?Continuing Reglan 10 every 8, ?Pain was controlled with Dilaudid and nausea with IV pain meds like Compazine-patient is stable for discharge at this time ?Leukocytosis ?Probably stress response-WBC was 8 on discharge ?  ? ?Discharge Exam: ?Vitals:  ? 07/01/21 1304 07/01/21 1305  ?BP: (!) 147/101 (!) 156/104  ?Pulse: (!) 107 (!) 103  ?Resp: 16 (!) 25  ?Temp:    ?SpO2: 98% 97%  ? ? ?Subj on day of d/c ?  ?Awake coherent no distress eating a burger at the bedside ? ?General Exam on discharge ? ?EOMI NCAT no focal deficit ?Mild thyromegaly noted no discrete specific nodule palpable ?No LAM ?Chest clear no rales rhonchi ?Abdomen soft no rebound no guarding ?ROM intact moving 4 limbs equally-psych is euthymic ? ?Discharge Instructions ? ? ?Discharge Instructions   ? ? Diet - low sodium heart healthy   Complete by: As directed ?  ? Discharge instructions   Complete by: As directed ?  ? Make sure that u follow with ur primary MD.  Make sure that u see Dr. Kelton Pillar in the outpatient setting--she can determine if u need any further thyroid testing etc. As an OP ?Please continue ur insulin pump and other meds ?You will notice that one of your BP meds , Lisinopril-has changed.  Please use Coreg [a blood pressure med] which will also decrease your heart rate ? ?Ask your doctor about resuming the lisinopril after u get labs and are seen ? ?Best of luck, and have  a happy summer  ? Increase activity slowly   Complete by: As directed ?  ? ?  ? ?Allergies as of 07/01/2021   ?No Known Allergies ?  ? ?  ?Medication List  ?  ? ?STOP taking these medications   ? ?lisinopril 10 MG tablet ?Commonly known as: ZESTRIL ?  ?traMADol 50 MG  tablet ?Commonly known as: ULTRAM ?  ? ?  ? ?TAKE these medications   ? ?albuterol 108 (90 Base) MCG/ACT inhaler ?Commonly known as: VENTOLIN HFA ?Inhale 2 puffs into the lungs every 6 (six) hours as needed for shortness of breath. ?  ?atorvastatin 20 MG tablet ?Commonly known as: LIPITOR ?Take 1 tablet (20 mg total) by mouth daily. ?  ?blood glucose meter kit and supplies ?Dispense based on patient and insurance preference. Use up to four times daily as directed. (FOR ICD-10 E10.9, E11.9). ?  ?carvedilol 6.25 MG tablet ?Commonly known as: COREG ?Take 1 tablet (6.25 mg total) by mouth 2 (two) times daily with a meal. ?  ?Dexcom G6 Receiver Devi ?USE AS DIRECTED ?What changed:  ?how much to take ?how to take this ?when to take this ?  ?Dexcom G6 Sensor Misc ?1 each by Does not apply route daily. ?  ?Dexcom G6 Transmitter Misc ?1 each by Does not apply route daily. ?  ?dicyclomine 20 MG tablet ?Commonly known as: BENTYL ?Take 1 tablet (20 mg total) by mouth 3 (three) times daily as needed for spasms. ?  ?gabapentin 300 MG capsule ?Commonly known as: NEURONTIN ?Take 1 capsule (300 mg total) by mouth 3 (three) times daily. ?  ?insulin regular 100 units/mL injection ?Commonly known as: NovoLIN R ?Inject 0-0.15 mLs (0-15 Units total) into the skin See admin instructions. Via pump.Start this medication after you follow up with your PCP ?What changed: additional instructions ?  ?Levemir FlexTouch 100 UNIT/ML FlexPen ?Generic drug: insulin detemir ?Inject 16 Units into the skin daily. ?  ?metoCLOPramide 10 MG tablet ?Commonly known as: Reglan ?Take 1 tablet (10 mg total) by mouth every 8 (eight) hours as needed for nausea. ?  ?pantoprazole 40 MG tablet ?Commonly known as: PROTONIX ?Take 1 tablet (40 mg total) by mouth 2 (two) times daily before a meal. ?  ?Pen Needles 3/16" 31G X 5 MM Misc ?1 each by Does not apply route in the morning, at noon, and at bedtime. ?  ?promethazine 12.5 MG tablet ?Commonly known as:  PHENERGAN ?Take 1 tablet (12.5 mg total) by mouth every 6 (six) hours as needed for nausea or vomiting. ?  ?True Metrix Blood Glucose Test test strip ?Generic drug: glucose blood ?Use as instructed ?  ?TRUEplus Lancets 28G Misc ?assist with checking blood sugar TID and qhs ?What changed:  ?how much to take ?how to take this ?when to take this ?  ? ?  ? ?No Known Allergies ? ? ? ?The results of significant diagnostics from this hospitalization (including imaging, microbiology, ancillary and laboratory) are listed below for reference.   ? ?Significant Diagnostic Studies: ?DG Chest 2 View ? ?Result Date: 06/04/2021 ?CLINICAL DATA:  Chest pain. EXAM: CHEST - 2 VIEW COMPARISON:  Chest x-ray April 22, 2021. FINDINGS: No consolidation. No visible pleural effusions or pneumothorax. Cardiomediastinal silhouette is within normal limits. No evidence of acute osseous abnormality. IMPRESSION: No evidence of acute cardiopulmonary disease. Electronically Signed   By: Kelli Sheffield M.D.   On: 06/04/2021 10:50   ? ?Microbiology: ?Recent Results (from the past 240 hour(s))  ?MRSA  Next Gen by PCR, Nasal     Status: None  ? Collection Time: 06/28/21  9:04 PM  ? Specimen: Nasal Mucosa; Nasal Swab  ?Result Value Ref Range Status  ? MRSA by PCR Next Gen NOT DETECTED NOT DETECTED Final  ?  Comment: (NOTE) ?The GeneXpert MRSA Assay (FDA approved for NASAL specimens only), ?is one component of a comprehensive MRSA colonization surveillance ?program. It is not intended to diagnose MRSA infection nor to guide ?or monitor treatment for MRSA infections. ?Test performance is not FDA approved in patients less than 2 years ?old. ?Performed at [facility], [address] ?[place], [place] [address] ?  ?  ? ?Labs: ?Basic Metabolic Panel: ?Recent Labs  ?Lab 06/28/21 ?1450 06/28/21 ?1618 06/29/21 ?0013 06/29/21 ?8832 06/29/21 ?0804 06/30/21 ?0257 07/01/21 ?0308  ?NA 138   < > 139 138 139 139 133*  ?K 3.9   < > 3.4* 3.3* 3.0* 5.0  4.8  ?CL 100   < > 104 104 102 109 101  ?CO2 21*   < > 28 27 27 27 25   ?GLUCOSE 423*   < > 194* 149* 172* 124* 193*  ?BUN 19   < > 15 17 16 16 14   ?CREATININE 1.13*   < > 0.77 1.10* 1.14* 0.99 1.03*  ?CALCIUM 9.7

## 2021-07-01 NOTE — Progress Notes (Signed)
Inpatient Diabetes Program Recommendations ? ?AACE/ADA: New Consensus Statement on Inpatient Glycemic Control (2015) ? ?Target Ranges:  Prepandial:   less than 140 mg/dL ?     Peak postprandial:   less than 180 mg/dL (1-2 hours) ?     Critically ill patients:  140 - 180 mg/dL  ? ?Lab Results  ?Component Value Date  ? GLUCAP 178 (H) 07/01/2021  ? HGBA1C 9.7 (H) 05/28/2021  ? ? ?Review of Glycemic Control ? ?Diabetes history: DM1 ?Outpatient Diabetes medications: Insulin pump or Levemir 16 units QD and Novolin R 0-15 units TID with meals ?Current orders for Inpatient glycemic control: Levemir 8 units Q24H, Novolog 0-9 units TID with meals + 3 units TID ? ?Pump settings: Basal rate:0.5 u/hr, ISF: 50, I/C: 12, timing 5 hours, target: 110.  ? ?HgbA1C - 9.7% ?Has been off pump since last admission ?Last visit to Endo - 04/18/21 ? ?Inpatient Diabetes Program Recommendations:   ? ?For discharge:  ? ?Increase Levemir to 12 units QD ?Novolin R 0-15 units TID with meals  ?F/U with Dr Lonzo Cloud ? ?Thank you. ?Ailene Ards, RD, LDN, CDE ?Inpatient Diabetes Coordinator ?[phone]  ? ? ? ? ? ? ? ? ?

## 2021-07-01 NOTE — Plan of Care (Signed)
  Problem: Education: Goal: Knowledge of General Education information will improve Description: Including pain rating scale, medication(s)/side effects and non-pharmacologic comfort measures Outcome: Adequate for Discharge   Problem: Health Behavior/Discharge Planning: Goal: Ability to manage health-related needs will improve Outcome: Adequate for Discharge   Problem: Clinical Measurements: Goal: Ability to maintain clinical measurements within normal limits will improve Outcome: Adequate for Discharge Goal: Will remain free from infection Outcome: Adequate for Discharge Goal: Diagnostic test results will improve Outcome: Adequate for Discharge Goal: Respiratory complications will improve Outcome: Adequate for Discharge Goal: Cardiovascular complication will be avoided Outcome: Adequate for Discharge   Problem: Activity: Goal: Risk for activity intolerance will decrease Outcome: Adequate for Discharge   Problem: Nutrition: Goal: Adequate nutrition will be maintained Outcome: Adequate for Discharge   Problem: Coping: Goal: Level of anxiety will decrease Outcome: Adequate for Discharge   Problem: Elimination: Goal: Will not experience complications related to bowel motility Outcome: Adequate for Discharge Goal: Will not experience complications related to urinary retention Outcome: Adequate for Discharge   Problem: Pain Managment: Goal: General experience of comfort will improve Outcome: Adequate for Discharge   Problem: Safety: Goal: Ability to remain free from injury will improve Outcome: Adequate for Discharge   Problem: Skin Integrity: Goal: Risk for impaired skin integrity will decrease Outcome: Adequate for Discharge   Problem: Education: Goal: Ability to describe self-care measures that may prevent or decrease complications (Diabetes Survival Skills Education) will improve Outcome: Adequate for Discharge Goal: Individualized Educational Video(s) Outcome:  Adequate for Discharge   Problem: Cardiac: Goal: Ability to maintain an adequate cardiac output will improve Outcome: Adequate for Discharge   Problem: Health Behavior/Discharge Planning: Goal: Ability to identify and utilize available resources and services will improve Outcome: Adequate for Discharge Goal: Ability to manage health-related needs will improve Outcome: Adequate for Discharge   Problem: Fluid Volume: Goal: Ability to achieve a balanced intake and output will improve Outcome: Adequate for Discharge   Problem: Metabolic: Goal: Ability to maintain appropriate glucose levels will improve Outcome: Adequate for Discharge   Problem: Nutritional: Goal: Maintenance of adequate nutrition will improve Outcome: Adequate for Discharge Goal: Maintenance of adequate weight for body size and type will improve Outcome: Adequate for Discharge   Problem: Respiratory: Goal: Will regain and/or maintain adequate ventilation Outcome: Adequate for Discharge   Problem: Urinary Elimination: Goal: Ability to achieve and maintain adequate renal perfusion and functioning will improve Outcome: Adequate for Discharge   

## 2021-07-09 ENCOUNTER — Ambulatory Visit (INDEPENDENT_AMBULATORY_CARE_PROVIDER_SITE_OTHER): Payer: 59 | Admitting: Family Medicine

## 2021-07-09 ENCOUNTER — Encounter: Payer: Self-pay | Admitting: Gastroenterology

## 2021-07-09 ENCOUNTER — Telehealth: Payer: Self-pay

## 2021-07-09 ENCOUNTER — Ambulatory Visit (INDEPENDENT_AMBULATORY_CARE_PROVIDER_SITE_OTHER): Payer: 59 | Admitting: Gastroenterology

## 2021-07-09 VITALS — BP 116/68 | HR 109 | Temp 97.8°F | Ht 68.0 in | Wt 182.2 lb

## 2021-07-09 VITALS — BP 118/88 | HR 111 | Ht 68.0 in | Wt 181.0 lb

## 2021-07-09 DIAGNOSIS — E1069 Type 1 diabetes mellitus with other specified complication: Secondary | ICD-10-CM

## 2021-07-09 DIAGNOSIS — K3184 Gastroparesis: Secondary | ICD-10-CM

## 2021-07-09 DIAGNOSIS — L239 Allergic contact dermatitis, unspecified cause: Secondary | ICD-10-CM

## 2021-07-09 MED ORDER — AMBULATORY NON FORMULARY MEDICATION: 1 refills | Status: DC

## 2021-07-09 MED ORDER — PREDNISONE 20 MG PO TABS: ORAL_TABLET | ORAL | 0 refills | Status: DC

## 2021-07-09 MED ORDER — HYDROXYZINE PAMOATE 25 MG PO CAPS: 25.0000 mg | ORAL_CAPSULE | Freq: Three times a day (TID) | ORAL | 0 refills | Status: DC | PRN

## 2021-07-09 NOTE — Patient Instructions (Signed)
Use frequent, cool compresses to face. Apply moisturizing lotio to face as needed. Recommend hypoallergenic lotions.

## 2021-07-09 NOTE — Telephone Encounter (Signed)
Faxed referral to [phone]

## 2021-07-09 NOTE — Progress Notes (Signed)
[facility] [address] [place] [address] Dept: [phone] Dept Fax: [phone]  Office Visit  Subjective:    Patient ID: Kelli Hudson, female    DOB: 01/18/1991, 30 y.o..   MRN: [id number]  Chief Complaint  Patient presents with   Acute Visit    C/o having rash on face x 2 days.  No new soaps or nothing new to eat.  She has been using lotion, Vaseline, benadryl.     History of Present Illness:  Patient is in today for evaluation of a rash on the face for the past 3 days. Kelli Hudson notes the rash started around the eyes and was itchy. It caused some swelling of the eyelids. It spread to the rest of the face. She has noted some swelling of her lips. She denies any swelling of the tongue or difficulty breathing. She has not used any new facial products, soaps or lotions. She did not start any new medications around that time. She has tried taking Benadryl.  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 ([place]) 04/10/2021   DKA (diabetic ketoacidosis) ([place]) 02/24/2021   Type 1 diabetes mellitus with diabetic polyneuropathy ([place]) 12/18/2020   Uncontrolled type 1 diabetes mellitus with hyperglycemia, with long-term current use of insulin ([place]) 12/18/2020   DM (diabetes mellitus), type 1 ([place]) 12/18/2020   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 pylori infection 10/07/2019   Depression with anxiety 07/05/2019   Diabetic polyneuropathy associated with type 1 diabetes mellitus ([place]) 09/24/2017   Leukocytosis    Goiter 10/05/2009   Hypertension associated with diabetes ([place]) 10/05/2009   Past Surgical History:  Procedure Laterality Date   AMPUTATION TOE Left 03/10/2018   Procedure: AMPUTATION FIFTH TOE;  Surgeon: Trula Slade, DPM;  Location: [facility];  Service: Podiatry;  Laterality: Left;   BIOPSY  01/28/2020   Procedure: BIOPSY;  Surgeon: Otis Brace, MD;  Location: [facility];  Service: Gastroenterology;;   ESOPHAGOGASTRODUODENOSCOPY N/A 01/28/2020   Procedure: ESOPHAGOGASTRODUODENOSCOPY (EGD);  Surgeon: Otis Brace, MD;  Location: [facility];  Service: Gastroenterology;  Laterality: N/A;   ESOPHAGOGASTRODUODENOSCOPY (EGD) WITH PROPOFOL Left 09/08/2015   Procedure: ESOPHAGOGASTRODUODENOSCOPY (EGD) WITH PROPOFOL;  Surgeon: Arta Silence, MD;  Location: [facility] ENDOSCOPY;  Service: Endoscopy;  Laterality: Left;   WISDOM TOOTH EXTRACTION     Family History  Problem Relation Age of Onset   Lung cancer Mother    Diabetes Mother    Bipolar disorder Father    Liver cancer Maternal Grandfather    Diabetes Maternal Aunt  x 2   Pancreatic cancer Maternal Uncle    Prostate cancer Maternal Uncle    Breast cancer Other        maternal great aunt   Heart disease Other    Ovarian cancer Other        maternal great aunt   Kidney disease Other        maternal great aunt   Colon  cancer Neg Hx    Stomach cancer Neg Hx    Outpatient Medications Prior to Visit  Medication Sig Dispense Refill   albuterol (VENTOLIN HFA) 108 (90 Base) MCG/ACT inhaler Inhale 2 puffs into the lungs every 6 (six) hours as needed for shortness of breath.     AMBULATORY NON FORMULARY MEDICATION Medication Name: Domperidone 10 mg: Take one tablet by mouth three times a day 90 tablet 1   atorvastatin (LIPITOR) 20 MG tablet Take 1 tablet (20 mg total) by mouth daily. 90 tablet 3   blood glucose meter kit and supplies Dispense based on patient and insurance preference. Use up to four times daily as directed. (FOR ICD-10 E10.9, E11.9). 1 each 0   carvedilol (COREG) 6.25 MG tablet Take 1 tablet (6.25 mg total) by mouth 2 (two) times daily with a meal. 30 tablet 2   Continuous Blood Gluc Sensor (DEXCOM G6 SENSOR) MISC 1 each by Does not apply route daily. 3 each 12   Continuous Blood Gluc Transmit (DEXCOM G6 TRANSMITTER) MISC 1 each by Does not apply route daily. 1 each 3   dicyclomine (BENTYL) 20 MG tablet Take 1 tablet (20 mg total) by mouth 3 (three) times daily as needed for spasms. 30 tablet 0   glucose blood (TRUE METRIX BLOOD GLUCOSE TEST) test strip Use as instructed 100 each 12   insulin detemir (LEVEMIR FLEXTOUCH) 100 UNIT/ML FlexPen Inject 16 Units into the skin daily. 10 mL 0   Insulin Pen Needle (PEN NEEDLES 3/16") 31G X 5 MM MISC 1 each by Does not apply route in the morning, at noon, and at bedtime. 100 each 0   insulin regular (NOVOLIN R) 100 units/mL injection Inject 0-0.15 mLs (0-15 Units total) into the skin See admin instructions. Via pump.Start this medication after you follow up with your PCP (Patient taking differently: Inject 0-15 Units into the skin See admin instructions. Via pump.) 30 mL 1   metoCLOPramide (REGLAN) 10 MG tablet Take 1 tablet (10 mg total) by mouth every 8 (eight) hours as needed for nausea. 30 tablet 0   pantoprazole (PROTONIX) 40 MG tablet Take 1 tablet (40 mg  total) by mouth 2 (two) times daily before a meal. 60 tablet 0   promethazine (PHENERGAN) 12.5 MG tablet Take 1 tablet (12.5 mg total) by mouth every 6 (six) hours as needed for nausea or vomiting. 30 tablet 0   TRUEPLUS LANCETS 28G MISC assist with checking blood sugar TID and qhs (Patient taking differently: 1 each by Other route See admin instructions. assist with checking blood sugar TID and qhs) 100 each 3   No facility-administered medications prior to visit.   No Known Allergies    Objective:   Today's Vitals   07/09/21 1551  BP: 116/68  Pulse: (!) 109  Temp: 97.8 F (36.6 C)  TempSrc: Temporal  SpO2: 96%  Weight: 182 lb 3.2 oz (82.6 kg)  Height: 5' 8"  (1.727 m)   Body mass index is 27.7 kg/m.   General: Well developed, well nourished. No acute distress. Skin: There is diffuse puffy  swelling of the skin of the face from the forehead, down around the eyes,   cheeks, nose, and chin. The lips also appear mildly swollen. There is a mild scaliness present and   some some amber crusts present. No purulent drainage noted. No sing of demarcated   erythema/induration. Psych: Alert and oriented. Normal mood and affect.  Health Maintenance Due  Topic Date Due   FOOT EXAM  05/22/2021   URINE MICROALBUMIN  05/22/2021      Assessment & Plan:   1. Allergic dermatitis The rash appears to be an allergic reaction. This is significant enough, that I recommend we place her on a tapering course of prednisone. Although this may worsen her blood sugar control int he short-term, it seems warranted due to the degree of rash present. She should sue cool compresses frequently and use a hypoallergenic lotion. I will reassess her in 1 week.  - predniSONE (DELTASONE) 20 MG tablet; Take 2 tablets (40 mg total) by mouth daily with breakfast for 5 days, THEN 1 tablet (20 mg total) daily with breakfast for 5 days, THEN 0.5 tablets (10 mg total) daily with breakfast for 6 days.  Dispense: 18 tablet;  Refill: 0 - hydrOXYzine (VISTARIL) 25 MG capsule; Take 1 capsule (25 mg total) by mouth every 8 (eight) hours as needed.  Dispense: 30 capsule; Refill: 0   Return in about 1 week (around 07/16/2021) for Reassessment.   [name], MD

## 2021-07-09 NOTE — Patient Instructions (Addendum)
If you are age 30 or older, your body mass index should be between 23-30. Your Body mass index is 27.52 kg/m. If this is out of the aforementioned range listed, please consider follow up with your Primary Care Provider.  If you are age 68 or younger, your body mass index should be between 19-25. Your Body mass index is 27.52 kg/m. If this is out of the aformentioned range listed, please consider follow up with your Primary Care Provider.   ________________________________________________________  The [facility] providers would like to encourage you to use [facility] to communicate with providers for non-urgent requests or questions.  Due to long hold times on the telephone, sending your provider a message by [facility] may be a faster and more efficient way to get a response.  Please allow 48 business hours for a response.  Please remember that this is for non-urgent requests.  _______________________________________________________  We are sending a prescription for Domperidone 10 mg Three times a day to a [facility]. Someone will contact you to discuss the cost.  We will refer you to Acupuncture. They will contact you directly to schedule an appointment.  It may take a week or more before you hear from them.  Please feel free to contact us if you have not heard from them within 2 weeks and we will follow up on the referral.   We are referring you to [facility].  They will contact you directly to schedule an appointment.  It may take a week or more before you hear from them.  Please feel free to contact us if you have not heard from them within 2 weeks and we will follow up on the referral.   Thank you for entrusting me with your care and for choosing [facility], Dr. Ileene Patrick '

## 2021-07-09 NOTE — Telephone Encounter (Signed)
Called and left message for [facility] at [phone] that we need their fax number to Refer patient for acupuncture

## 2021-07-09 NOTE — Progress Notes (Signed)
HPI :  31 year old female here for a follow-up visit for history of gastroparesis.  Recall she was seen by our office for this over a year ago.  She has been in the emergency room or hospitalized for this multiple times over the past few years, particularly she has been in the hospital 4 times over the past 2 months, most recently 2 weeks ago.  She has chronic early satiety, finds it hard to eat.  Feels full quite easily.  Does not have much reflux symptoms that bother her on her current regimen.  She has episodes where her gastroparesis bothers her so much she feels nauseated and can have vomiting which can lead to a severe episode of dry heaving and leads to dehydration and ultimately presenting to the hospital and admission for fluids and antiemetics.  She has diabetes, is on an insulin pump, she states her most recent A1c is 9.7, previously was in the 14s upon diagnosis.  She had diabetes for years now.  She most recently was placed back on some long-acting insulin which she thinks is helping.  She had an EGD in 2021 when she was admitted for this issue which was negative.  She had a gastric emptying study in March 2022 showing severe gastroparesis.  She does have gallstones in her gallbladder on prior imaging, most recently had cross-sectional imaging with a CT scan of the abdomen pelvis with contrast in March which did not show anything too concerning.  She denies any biliary colic on a routine basis.  She has no clear triggers for her symptoms that can bother her.  She does not use any NSAIDs routinely, rare Goody powder use.  She tries to eat a very bland diet and does not experiment with eating new foods.  She does not think Zofran is ever really provided much help so she does not take that.  She uses Phenergan as needed at home.  She is on a baseline regimen of Protonix 40 mg twice daily, has also been on Reglan 10 mg 3 times daily.  She states she has been on Reglan for about a year now.  At  times she thinks it helps but she feels the benefit has waned over time.  She has never tried domperidone or erythromycin.  She inquires about a gastric pacemaker.  Her mother is very concerned about her recurrent admissions to the hospital.  Mother was on the phone during the encounter today and I answered her questions.  She had an EKG done recently with a QTc of 430 They inquire about other options and how to manage this.  Of note she also complains about red eyes and swelling of her cheeks today.  Denies any shortness of breath, she is not sure if she is having allergic reaction to something.  Took a Benadryl last night no benefit yet.  She is seeing her primary care in a few hours for this issue.  Denies any new medications in relation to these symptoms   Past Medical History:  Diagnosis Date   Acute H. pylori gastric ulcer    Diabetes mellitus ([place])    Diabetic gastroparesis ([place])    Gastroparesis    GERD (gastroesophageal reflux disease)    Hypertension    Hyperthyroidism      Past Surgical History:  Procedure Laterality Date   AMPUTATION TOE Left 03/10/2018   Procedure: AMPUTATION FIFTH TOE;  Surgeon: Trula Slade, DPM;  Location: [place];  Service: Podiatry;  Laterality: Left;   BIOPSY  01/28/2020   Procedure: BIOPSY;  Surgeon: Otis Brace, MD;  Location: [facility];  Service: Gastroenterology;;   ESOPHAGOGASTRODUODENOSCOPY N/A 01/28/2020   Procedure: ESOPHAGOGASTRODUODENOSCOPY (EGD);  Surgeon: Otis Brace, MD;  Location: [facility];  Service: Gastroenterology;  Laterality: N/A;   ESOPHAGOGASTRODUODENOSCOPY (EGD) WITH PROPOFOL Left 09/08/2015   Procedure: ESOPHAGOGASTRODUODENOSCOPY (EGD) WITH PROPOFOL;  Surgeon: Arta Silence, MD;  Location: [facility]COPY;  Service: Endoscopy;  Laterality: Left;   WISDOM TOOTH EXTRACTION     Family History  Problem Relation Age of Onset   Lung cancer Mother    Diabetes Mother    Bipolar disorder Father     Liver cancer Maternal Grandfather    Diabetes Maternal Aunt        x 2   Pancreatic cancer Maternal Uncle    Prostate cancer Maternal Uncle    Breast cancer Other        maternal great aunt   Heart disease Other    Ovarian cancer Other        maternal great aunt   Kidney disease Other        maternal great aunt   Colon cancer Neg Hx    Stomach cancer Neg Hx    Social History   Tobacco Use   Smoking status: Never   Smokeless tobacco: Never  Vaping Use   Vaping Use: Never used  Substance Use Topics   Alcohol use: Yes    Alcohol/week: 1.0 standard drink    Types: 1 Shots of liquor per week    Comment: occasional    Drug use: No   Current Outpatient Medications  Medication Sig Dispense Refill   albuterol (VENTOLIN HFA) 108 (90 Base) MCG/ACT inhaler Inhale 2 puffs into the lungs every 6 (six) hours as needed for shortness of breath.     atorvastatin (LIPITOR) 20 MG tablet Take 1 tablet (20 mg total) by mouth daily. 90 tablet 3   blood glucose meter kit and supplies Dispense based on patient and insurance preference. Use up to four times daily as directed. (FOR ICD-10 E10.9, E11.9). 1 each 0   carvedilol (COREG) 6.25 MG tablet Take 1 tablet (6.25 mg total) by mouth 2 (two) times daily with a meal. 30 tablet 2   Continuous Blood Gluc Sensor (DEXCOM G6 SENSOR) MISC 1 each by Does not apply route daily. 3 each 12   Continuous Blood Gluc Transmit (DEXCOM G6 TRANSMITTER) MISC 1 each by Does not apply route daily. 1 each 3   dicyclomine (BENTYL) 20 MG tablet Take 1 tablet (20 mg total) by mouth 3 (three) times daily as needed for spasms. 30 tablet 0   glucose blood (TRUE METRIX BLOOD GLUCOSE TEST) test strip Use as instructed 100 each 12   insulin detemir (LEVEMIR FLEXTOUCH) 100 UNIT/ML FlexPen Inject 16 Units into the skin daily. 10 mL 0   Insulin Pen Needle (PEN NEEDLES 3/16") 31G X 5 MM MISC 1 each by Does not apply route in the morning, at noon, and at bedtime. 100 each 0    insulin regular (NOVOLIN R) 100 units/mL injection Inject 0-0.15 mLs (0-15 Units total) into the skin See admin instructions. Via pump.Start this medication after you follow up with your PCP (Patient taking differently: Inject 0-15 Units into the skin See admin instructions. Via pump.) 30 mL 1   metoCLOPramide (REGLAN) 10 MG tablet Take 1 tablet (10 mg total) by mouth every 8 (eight) hours as needed for nausea. 30 tablet 0  pantoprazole (PROTONIX) 40 MG tablet Take 1 tablet (40 mg total) by mouth 2 (two) times daily before a meal. 60 tablet 0   promethazine (PHENERGAN) 12.5 MG tablet Take 1 tablet (12.5 mg total) by mouth every 6 (six) hours as needed for nausea or vomiting. 30 tablet 0   TRUEPLUS LANCETS 28G MISC assist with checking blood sugar TID and qhs (Patient taking differently: 1 each by Other route See admin instructions. assist with checking blood sugar TID and qhs) 100 each 3   No current facility-administered medications for this visit.   No Known Allergies   Review of Systems: All systems reviewed and negative except where noted in HPI.   Lab Results  Component Value Date   WBC 8.8 07/01/2021   HGB 10.6 (L) 07/01/2021   HCT 33.3 (L) 07/01/2021   MCV 92.0 07/01/2021   PLT 361 07/01/2021    Lab Results  Component Value Date   CREATININE 1.03 (H) 07/01/2021   BUN 14 07/01/2021   NA 133 (L) 07/01/2021   K 4.8 07/01/2021   CL 101 07/01/2021   CO2 25 07/01/2021    Lab Results  Component Value Date   ALT 14 07/01/2021   AST 17 07/01/2021   ALKPHOS 73 07/01/2021   BILITOT 0.7 07/01/2021      Physical Exam: BP 118/88   Pulse (!) 111   Ht 5' 8"  (1.727 m)   Wt 181 lb (82.1 kg)   SpO2 99%   BMI 27.52 kg/m  Constitutional: Pleasant,well-developed, female in no acute distress. Skin: Skin is warm and dry. No rashes noted. Psychiatric: Normal mood and affect. Behavior is normal.   ASSESSMENT AND PLAN: 31 year old female here for reassessment of the  following:  Gastroparesis Diabetes  Patient with severe diabetic gastroparesis that is proving to be refractory to Reglan, PPI, antiemetics.  She has been in the hospital 4 times over the past 2 months.  She is trying her best to get her glucose better regulated, long-acting insulin recently added back to her regimen.  I discussed options with the patient and her mother.  She is been on Reglan for some time now and it does not seem to be helping, also at risk for side effects including tardive dyskinesia.  We discussed other options.  Her QTc is normal, offered her a trial of domperidone 10 mg 3 times daily if she can afford [employer] cover it.  Organ to see how much this will cost for the patient.  If she can get it I think this may be her next best option.  We will need to monitor her EKG while on this regimen and stop Reglan if she gets it.  Other things we can consider would be oral erythromycin as needed but cannot use this chronically due to tachyphylaxis.  Other alternatives to consider are acupuncture, and also referral to tertiary care center such as [facility] motility center, for consideration of gastric pacemaker or pyloromyotomy.  I discussed all these options with the patient and her mother.  They want to proceed with domperidone however also want referral to [facility] to see the motility specialist.  In the interim I will also refer her for acupuncture to see if that will help.  She will continue twice daily PPI to control reflux in the setting and Phenergan as needed.  Of note, seeing PCP in a few hours for possible allergic reaction as described above.  Plan: - will order Domperidone 5m TID - will  see how much it costs / insurance coverage. Will need monitoring of EKG on the regimen and stop Reglan when she does this, she will contact me if and when she starts domperisone - can consider use liquid erythromycin PRN, will call if needed - refer for acupuncture - refer to [place] - motility clinic - evaluate for possible gastric pacemaker or pyloromyotomy  - will stop reglan if she is able to get domperidone. Needs periodic drug holiday from this regardless - seeing PCP for rash / edema today - unclear reaction but no respiratory complaints at this time  Jolly Mango, MD [facility] Gastroenterology

## 2021-07-12 ENCOUNTER — Ambulatory Visit: Payer: 59 | Admitting: Family Medicine

## 2021-07-17 ENCOUNTER — Encounter: Payer: Self-pay | Admitting: Family Medicine

## 2021-07-17 ENCOUNTER — Ambulatory Visit (INDEPENDENT_AMBULATORY_CARE_PROVIDER_SITE_OTHER): Payer: 59 | Admitting: Family Medicine

## 2021-07-17 VITALS — BP 136/74 | HR 103 | Temp 98.1°F | Ht 68.0 in | Wt 188.2 lb

## 2021-07-17 DIAGNOSIS — L239 Allergic contact dermatitis, unspecified cause: Secondary | ICD-10-CM

## 2021-07-17 NOTE — Progress Notes (Signed)
[facility] [address] [place] [address] Dept: [phone] Dept Fax: [phone]  Office Visit  Subjective:    Patient ID: Kelli Hudson, female    DOB: [birth year], 31 y.o..   MRN: [id number]  Chief Complaint  Patient presents with   Follow-up    1 week f/u.       History of Present Illness:  Patient is in today for reassessment of a facial rash. I had seen Kelli Hudson on 5/23 with a 3-day history of diffuse facial rash. The rash started around the eyes and was itchy. It caused some swelling of the eyelids. It spread to the rest of the face. She had noted some swelling of her lips. She denied any swelling of the tongue or difficulty breathing. She had not used any new facial products, soaps or lotions. She did not start any new medications around that time. He exam showed puffy swelling of the skin of the face from forehead to chin with some mild lip swelling, mild scaliness, and scant crusting. I treated her with a course of prednisone. She notes she is having some improvement in the rash, though not fully resolved. She has had some itching still. She notes her blood sugars have been running a bit higher (up to 350) since being on the prednisone.  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 ([place]) 04/10/2021   DKA (diabetic ketoacidosis) ([place]) 02/24/2021   Type 1 diabetes mellitus with diabetic polyneuropathy ([place]) 12/18/2020   Uncontrolled type 1 diabetes mellitus with hyperglycemia, with  long-term current use of insulin ([place]) 12/18/2020   DM (diabetes mellitus), type 1 ([place]) 12/18/2020   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 pylori infection 10/07/2019   Depression with anxiety 07/05/2019   Diabetic polyneuropathy associated with type 1 diabetes mellitus ([place]) 09/24/2017   Leukocytosis    Goiter 10/05/2009   Hypertension associated with diabetes ([place]) 10/05/2009   Past Surgical History:  Procedure Laterality Date   AMPUTATION TOE Left 03/10/2018   Procedure: AMPUTATION FIFTH TOE;  Surgeon: Trula Slade, DPM;  Location: [place];  Service: Podiatry;  Laterality: Left;   BIOPSY  01/28/2020   Procedure: BIOPSY;  Surgeon: Otis Brace, MD;  Location: [facility];  Service: Gastroenterology;;   ESOPHAGOGASTRODUODENOSCOPY N/A 01/28/2020   Procedure: ESOPHAGOGASTRODUODENOSCOPY (EGD);  Surgeon: Otis Brace, MD;  Location: [facility];  Service: Gastroenterology;  Laterality: N/A;   ESOPHAGOGASTRODUODENOSCOPY (EGD) WITH PROPOFOL Left 09/08/2015   Procedure: ESOPHAGOGASTRODUODENOSCOPY (EGD) WITH PROPOFOL;  Surgeon: Arta Silence, MD;  Location: [place];  Service:  Endoscopy;  Laterality: Left;   WISDOM TOOTH EXTRACTION     Family History  Problem Relation Age of Onset   Lung cancer Mother    Diabetes Mother    Bipolar disorder Father    Liver cancer Maternal Grandfather    Diabetes  Maternal Aunt        x 2   Pancreatic cancer Maternal Uncle    Prostate cancer Maternal Uncle    Breast cancer Other        maternal great aunt   Heart disease Other    Ovarian cancer Other        maternal great aunt   Kidney disease Other        maternal great aunt   Colon cancer Neg Hx    Stomach cancer Neg Hx    Outpatient Medications Prior to Visit  Medication Sig Dispense Refill   albuterol (VENTOLIN HFA) 108 (90 Base) MCG/ACT inhaler Inhale 2 puffs into the lungs every 6 (six) hours as needed for shortness of breath.     AMBULATORY NON FORMULARY MEDICATION Medication Name: Domperidone 10 mg: Take one tablet by mouth three times a day 90 tablet 1   atorvastatin (LIPITOR) 20 MG tablet Take 1 tablet (20 mg total) by mouth daily. 90 tablet 3   blood glucose meter kit and supplies Dispense based on patient and insurance preference. Use up to four times daily as directed. (FOR ICD-10 E10.9, E11.9). 1 each 0   carvedilol (COREG) 6.25 MG tablet Take 1 tablet (6.25 mg total) by mouth 2 (two) times daily with a meal. 30 tablet 2   Continuous Blood Gluc Sensor (DEXCOM G6 SENSOR) MISC 1 each by Does not apply route daily. 3 each 12   Continuous Blood Gluc Transmit (DEXCOM G6 TRANSMITTER) MISC 1 each by Does not apply route daily. 1 each 3   dicyclomine (BENTYL) 20 MG tablet Take 1 tablet (20 mg total) by mouth 3 (three) times daily as needed for spasms. 30 tablet 0   glucose blood (TRUE METRIX BLOOD GLUCOSE TEST) test strip Use as instructed 100 each 12   hydrOXYzine (VISTARIL) 25 MG capsule Take 1 capsule (25 mg total) by mouth every 8 (eight) hours as needed. 30 capsule 0   insulin detemir (LEVEMIR FLEXTOUCH) 100 UNIT/ML FlexPen Inject 16 Units into the skin daily. 10 mL 0   Insulin Pen Needle (PEN NEEDLES 3/16") 31G X 5 MM MISC 1 each by Does not apply route in the morning, at noon, and at bedtime. 100 each 0   insulin regular (NOVOLIN R) 100 units/mL injection Inject 0-0.15 mLs (0-15 Units  total) into the skin See admin instructions. Via pump.Start this medication after you follow up with your PCP (Patient taking differently: Inject 0-15 Units into the skin See admin instructions. Via pump.) 30 mL 1   pantoprazole (PROTONIX) 40 MG tablet Take 1 tablet (40 mg total) by mouth 2 (two) times daily before a meal. 60 tablet 0   predniSONE (DELTASONE) 20 MG tablet Take 2 tablets (40 mg total) by mouth daily with breakfast for 5 days, THEN 1 tablet (20 mg total) daily with breakfast for 5 days, THEN 0.5 tablets (10 mg total) daily with breakfast for 6 days. 18 tablet 0   promethazine (PHENERGAN) 12.5 MG tablet Take 1 tablet (12.5 mg total) by mouth every 6 (six) hours as needed for nausea or vomiting. 30 tablet 0   TRUEPLUS LANCETS 28G MISC assist with checking blood sugar TID and qhs (  Patient taking differently: 1 each by Other route See admin instructions. assist with checking blood sugar TID and qhs) 100 each 3   metoCLOPramide (REGLAN) 10 MG tablet Take 1 tablet (10 mg total) by mouth every 8 (eight) hours as needed for nausea. 30 tablet 0   No facility-administered medications prior to visit.   No Known Allergies    Objective:   Today's Vitals   07/17/21 1455  BP: 136/74  Pulse: (!) 103  Temp: 98.1 F (36.7 C)  TempSrc: Temporal  SpO2: 96%  Weight: 188 lb 3.2 oz (85.4 kg)  Height: 5' 8"  (1.727 m)   Body mass index is 28.62 kg/m.   General: Well developed, well nourished. No acute distress. Skin: There is less swelling to the face. There area some papules present , esp. over the chin and in the   nasolabial folds. A few lesions are excoriated. Psych: Alert and oriented. Normal mood and affect.  Health Maintenance Due  Topic Date Due   FOOT EXAM  05/22/2021   URINE MICROALBUMIN  05/22/2021     Assessment & Plan:   1. Allergic dermatitis Improving. Kelli Hudson should complete her prednisone course. I recommend she use HC 1% cream to the face twice a day to help with  itching. We reviewed a nighttime facial routine of using mild soap, rinsing, patting dry, and applying a moisturizing lotion. I will reassess this in 1 month and follow-up on chronic medical issues. Her mother had asked about allergy testing. I do not feel that will be beneficial at this point.  Return in about 4 weeks (around 08/14/2021) for Reassessment.   Kelli Salter, MD

## 2021-07-18 ENCOUNTER — Telehealth: Payer: Self-pay

## 2021-07-18 NOTE — Telephone Encounter (Signed)
Tempie Donning, a referral nurse at [facility] called back. She indicated that they do not do gastric pacemaker evaluation or pyloromyotomy.  We need to refer directly to "surgery" for that evaluation.  Note their call back number is [phone].

## 2021-07-18 NOTE — Telephone Encounter (Signed)
Patient referred to [facility] for gastric pacemaker evaluation or pyloromyotomy on 07-10-21. Called [facility] to check on Referral.  It is "ready for scheduling" but still under "review". Supposedly patient will be contacted once the review is complete however I was placed on hold but staff never came back to the line.

## 2021-07-19 NOTE — Telephone Encounter (Signed)
Referral to [facility] faxed  to [phone]

## 2021-07-20 ENCOUNTER — Encounter (HOSPITAL_COMMUNITY): Payer: Self-pay

## 2021-07-20 ENCOUNTER — Inpatient Hospital Stay (HOSPITAL_COMMUNITY)
Admission: EM | Admit: 2021-07-20 | Discharge: 2021-07-23 | DRG: 074 | Disposition: A | Discharge status: Home / Self Care | Payer: 59 | Attending: Internal Medicine | Admitting: Internal Medicine

## 2021-07-20 ENCOUNTER — Other Ambulatory Visit: Payer: Self-pay

## 2021-07-20 DIAGNOSIS — Z79899 Other long term (current) drug therapy: Secondary | ICD-10-CM

## 2021-07-20 DIAGNOSIS — E0590 Thyrotoxicosis, unspecified without thyrotoxic crisis or storm: Secondary | ICD-10-CM | POA: Diagnosis present

## 2021-07-20 DIAGNOSIS — L239 Allergic contact dermatitis, unspecified cause: Secondary | ICD-10-CM | POA: Diagnosis present

## 2021-07-20 DIAGNOSIS — E860 Dehydration: Secondary | ICD-10-CM | POA: Diagnosis present

## 2021-07-20 DIAGNOSIS — E1065 Type 1 diabetes mellitus with hyperglycemia: Secondary | ICD-10-CM | POA: Diagnosis present

## 2021-07-20 DIAGNOSIS — T380X5A Adverse effect of glucocorticoids and synthetic analogues, initial encounter: Secondary | ICD-10-CM | POA: Diagnosis present

## 2021-07-20 DIAGNOSIS — K219 Gastro-esophageal reflux disease without esophagitis: Secondary | ICD-10-CM | POA: Diagnosis present

## 2021-07-20 DIAGNOSIS — K3184 Gastroparesis: Secondary | ICD-10-CM | POA: Diagnosis present

## 2021-07-20 DIAGNOSIS — R112 Nausea with vomiting, unspecified: Secondary | ICD-10-CM | POA: Diagnosis present

## 2021-07-20 DIAGNOSIS — N1832 Chronic kidney disease, stage 3b: Secondary | ICD-10-CM | POA: Diagnosis present

## 2021-07-20 DIAGNOSIS — Z89422 Acquired absence of other left toe(s): Secondary | ICD-10-CM

## 2021-07-20 DIAGNOSIS — E1043 Type 1 diabetes mellitus with diabetic autonomic (poly)neuropathy: Secondary | ICD-10-CM | POA: Diagnosis not present

## 2021-07-20 DIAGNOSIS — E1042 Type 1 diabetes mellitus with diabetic polyneuropathy: Secondary | ICD-10-CM | POA: Diagnosis present

## 2021-07-20 DIAGNOSIS — R739 Hyperglycemia, unspecified: Secondary | ICD-10-CM | POA: Diagnosis present

## 2021-07-20 DIAGNOSIS — Z833 Family history of diabetes mellitus: Secondary | ICD-10-CM

## 2021-07-20 DIAGNOSIS — I10 Essential (primary) hypertension: Secondary | ICD-10-CM | POA: Diagnosis present

## 2021-07-20 DIAGNOSIS — Z794 Long term (current) use of insulin: Secondary | ICD-10-CM

## 2021-07-20 DIAGNOSIS — N179 Acute kidney failure, unspecified: Secondary | ICD-10-CM | POA: Diagnosis present

## 2021-07-20 DIAGNOSIS — Z7952 Long term (current) use of systemic steroids: Secondary | ICD-10-CM

## 2021-07-20 LAB — I-STAT BETA HCG BLOOD, ED (MC, WL, AP ONLY): I-stat hCG, quantitative: <5 m[IU]/mL (ref <5)

## 2021-07-20 LAB — URINALYSIS, ROUTINE W REFLEX MICROSCOPIC
Bilirubin Urine: NEGATIVE
Glucose, UA: >=500 mg/dL — AB
Hgb urine dipstick: NEGATIVE
Ketones, ur: NEGATIVE mg/dL
Leukocytes,Ua: NEGATIVE
Nitrite: NEGATIVE
Protein, ur: 100 mg/dL — AB
Specific Gravity, Urine: 1.022 (ref 1.005–1.030)
pH: 6 (ref 5.0–8.0)

## 2021-07-20 LAB — COMPREHENSIVE METABOLIC PANEL
ALT: 25 U/L (ref 0–44)
AST: 24 U/L (ref 15–41)
Albumin: 3.5 g/dL (ref 3.5–5.0)
Alkaline Phosphatase: 129 U/L — ABNORMAL HIGH (ref 38–126)
Anion gap: 13 (ref 5–15)
BUN: 24 mg/dL — ABNORMAL HIGH (ref 6–20)
CO2: 26 mmol/L (ref 22–32)
Calcium: 10.3 mg/dL (ref 8.9–10.3)
Chloride: 97 mmol/L — ABNORMAL LOW (ref 98–111)
Creatinine, Ser: 1.58 mg/dL — ABNORMAL HIGH (ref 0.44–1.00)
GFR, Estimated: 45 mL/min — ABNORMAL LOW (ref >60)
Glucose, Bld: 398 mg/dL — ABNORMAL HIGH (ref 70–99)
Potassium: 4.2 mmol/L (ref 3.5–5.1)
Sodium: 136 mmol/L (ref 135–145)
Total Bilirubin: 0.8 mg/dL (ref 0.3–1.2)
Total Protein: 8.4 g/dL — ABNORMAL HIGH (ref 6.5–8.1)

## 2021-07-20 LAB — CBC
HCT: 39.8 % (ref 36.0–46.0)
Hemoglobin: 13.3 g/dL (ref 12.0–15.0)
MCH: 29.4 pg (ref 26.0–34.0)
MCHC: 33.4 g/dL (ref 30.0–36.0)
MCV: 87.9 fL (ref 80.0–100.0)
Platelets: 465 10*3/uL — ABNORMAL HIGH (ref 150–400)
RBC: 4.53 MIL/uL (ref 3.87–5.11)
RDW: 13.2 % (ref 11.5–15.5)
WBC: 13.6 10*3/uL — ABNORMAL HIGH (ref 4.0–10.5)
nRBC: 0 % (ref 0.0–0.2)

## 2021-07-20 LAB — CBG MONITORING, ED
Glucose-Capillary: 284 mg/dL — ABNORMAL HIGH (ref 70–99)
Glucose-Capillary: 296 mg/dL — ABNORMAL HIGH (ref 70–99)

## 2021-07-20 LAB — LIPASE, BLOOD: Lipase: 30 U/L (ref 11–51)

## 2021-07-20 MED ORDER — CARVEDILOL 6.25 MG PO TABS
6.2500 mg | ORAL_TABLET | Freq: Two times a day (BID) | ORAL | Status: DC
  Administered 2021-07-21 – 2021-07-23 (×5): 6.25 mg via ORAL
  Filled 2021-07-20 (×5): qty 1

## 2021-07-20 MED ORDER — LACTATED RINGERS IV BOLUS
1000.0000 mL | Freq: Once | INTRAVENOUS | Status: AC
  Administered 2021-07-20: 1000 mL via INTRAVENOUS

## 2021-07-20 MED ORDER — ALBUTEROL SULFATE (2.5 MG/3ML) 0.083% IN NEBU: 2.5000 mg | INHALATION_SOLUTION | Freq: Four times a day (QID) | RESPIRATORY_TRACT | Status: DC | PRN

## 2021-07-20 MED ORDER — ONDANSETRON HCL 4 MG/2ML IJ SOLN
4.0000 mg | Freq: Once | INTRAMUSCULAR | Status: AC
Start: 2021-07-20 — End: 2021-07-20
  Administered 2021-07-20: 4 mg via INTRAVENOUS
  Filled 2021-07-20: qty 2

## 2021-07-20 MED ORDER — ONDANSETRON HCL 4 MG PO TABS: 4.0000 mg | ORAL_TABLET | Freq: Four times a day (QID) | ORAL | Status: DC | PRN

## 2021-07-20 MED ORDER — METOCLOPRAMIDE HCL 5 MG/ML IJ SOLN
10.0000 mg | Freq: Once | INTRAMUSCULAR | Status: AC
  Administered 2021-07-20: 10 mg via INTRAVENOUS
  Filled 2021-07-20: qty 2

## 2021-07-20 MED ORDER — INSULIN DETEMIR 100 UNIT/ML ~~LOC~~ SOLN
12.0000 [IU] | Freq: Every day | SUBCUTANEOUS | Status: DC
  Administered 2021-07-20: 12 [IU] via SUBCUTANEOUS
  Filled 2021-07-20 (×2): qty 0.12

## 2021-07-20 MED ORDER — HYDROMORPHONE HCL 1 MG/ML IJ SOLN
1.0000 mg | Freq: Once | INTRAMUSCULAR | Status: AC
  Administered 2021-07-20: 1 mg via INTRAVENOUS
  Filled 2021-07-20: qty 1

## 2021-07-20 MED ORDER — INSULIN ASPART 100 UNIT/ML IJ SOLN
0.0000 [IU] | Freq: Every day | INTRAMUSCULAR | Status: DC
  Administered 2021-07-20: 3 [IU] via SUBCUTANEOUS
  Administered 2021-07-21: 5 [IU] via SUBCUTANEOUS
  Filled 2021-07-20: qty 0.05

## 2021-07-20 MED ORDER — PANTOPRAZOLE SODIUM 40 MG PO TBEC
40.0000 mg | DELAYED_RELEASE_TABLET | Freq: Two times a day (BID) | ORAL | Status: DC
  Administered 2021-07-21 – 2021-07-23 (×5): 40 mg via ORAL
  Filled 2021-07-20 (×5): qty 1

## 2021-07-20 MED ORDER — ATORVASTATIN CALCIUM 20 MG PO TABS
20.0000 mg | ORAL_TABLET | Freq: Every day | ORAL | Status: DC
  Administered 2021-07-21 – 2021-07-23 (×3): 20 mg via ORAL
  Filled 2021-07-20 (×3): qty 1

## 2021-07-20 MED ORDER — DIPHENHYDRAMINE HCL 50 MG/ML IJ SOLN
25.0000 mg | Freq: Once | INTRAMUSCULAR | Status: AC
Start: 2021-07-20 — End: 2021-07-20
  Administered 2021-07-20: 25 mg via INTRAVENOUS
  Filled 2021-07-20: qty 1

## 2021-07-20 MED ORDER — ENOXAPARIN SODIUM 40 MG/0.4ML IJ SOSY
40.0000 mg | PREFILLED_SYRINGE | INTRAMUSCULAR | Status: DC
  Administered 2021-07-21 – 2021-07-22 (×2): 40 mg via SUBCUTANEOUS
  Filled 2021-07-20 (×2): qty 0.4

## 2021-07-20 MED ORDER — ONDANSETRON HCL 4 MG/2ML IJ SOLN
4.0000 mg | Freq: Four times a day (QID) | INTRAMUSCULAR | Status: DC | PRN
  Administered 2021-07-20 – 2021-07-21 (×2): 4 mg via INTRAVENOUS
  Filled 2021-07-20 (×2): qty 2

## 2021-07-20 MED ORDER — HYDROMORPHONE HCL 1 MG/ML IJ SOLN
0.5000 mg | INTRAMUSCULAR | Status: DC | PRN
  Administered 2021-07-20 – 2021-07-21 (×3): 0.5 mg via INTRAVENOUS
  Filled 2021-07-20 (×3): qty 0.5

## 2021-07-20 MED ORDER — ACETAMINOPHEN 325 MG PO TABS: 650.0000 mg | ORAL_TABLET | Freq: Four times a day (QID) | ORAL | Status: DC | PRN

## 2021-07-20 MED ORDER — ALBUTEROL SULFATE HFA 108 (90 BASE) MCG/ACT IN AERS
2.0000 | INHALATION_SPRAY | Freq: Four times a day (QID) | RESPIRATORY_TRACT | Status: DC | PRN
Start: 2021-07-20 — End: 2021-07-20

## 2021-07-20 MED ORDER — METOCLOPRAMIDE HCL 5 MG/ML IJ SOLN
10.0000 mg | Freq: Three times a day (TID) | INTRAMUSCULAR | Status: DC
  Administered 2021-07-21 – 2021-07-23 (×7): 10 mg via INTRAVENOUS
  Filled 2021-07-20 (×7): qty 2

## 2021-07-20 MED ORDER — DICYCLOMINE HCL 20 MG PO TABS
20.0000 mg | ORAL_TABLET | Freq: Three times a day (TID) | ORAL | Status: DC | PRN
Start: 2021-07-20 — End: 2021-07-23

## 2021-07-20 MED ORDER — INSULIN ASPART 100 UNIT/ML IJ SOLN
0.0000 [IU] | Freq: Three times a day (TID) | INTRAMUSCULAR | Status: DC
  Administered 2021-07-21: 8 [IU] via SUBCUTANEOUS
  Administered 2021-07-21: 5 [IU] via SUBCUTANEOUS
  Administered 2021-07-22: 8 [IU] via SUBCUTANEOUS
  Administered 2021-07-22: 3 [IU] via SUBCUTANEOUS
  Administered 2021-07-22 – 2021-07-23 (×2): 8 [IU] via SUBCUTANEOUS
  Filled 2021-07-20: qty 0.15

## 2021-07-20 MED ORDER — HYDROMORPHONE HCL 1 MG/ML IJ SOLN
0.2500 mg | Freq: Once | INTRAMUSCULAR | Status: AC
  Administered 2021-07-20: 0.25 mg via INTRAVENOUS
  Filled 2021-07-20: qty 1

## 2021-07-20 MED ORDER — ACETAMINOPHEN 650 MG RE SUPP: 650.0000 mg | Freq: Four times a day (QID) | RECTAL | Status: DC | PRN

## 2021-07-20 MED ORDER — PANTOPRAZOLE SODIUM 40 MG IV SOLR
40.0000 mg | Freq: Once | INTRAVENOUS | Status: AC
Start: 2021-07-20 — End: 2021-07-20
  Administered 2021-07-20: 40 mg via INTRAVENOUS
  Filled 2021-07-20: qty 10

## 2021-07-20 MED ORDER — HALOPERIDOL LACTATE 5 MG/ML IJ SOLN
4.0000 mg | Freq: Once | INTRAMUSCULAR | Status: AC
  Administered 2021-07-20: 4 mg via INTRAVENOUS
  Filled 2021-07-20: qty 1

## 2021-07-20 NOTE — ED Notes (Signed)
Pt informs this RN that aprox 5 mins after PO challenge she had an emesis episode; this RN notified EDP

## 2021-07-20 NOTE — ED Provider Triage Note (Signed)
Emergency Medicine Provider Triage Evaluation Note  Kassia Staves , a 31 y.o. female  was evaluated in triage.  Pt complains of abdominal pain nausea, vomiting.  She states that same began this morning and feels like her normal flares of gastroparesis.  She attempted to take her home meds without relief of her symptoms.  States that her blood sugars have been in the 300s recently because she is fighting an infection for which she is taking antibiotics.  She denies any fevers or chills.  Review of Systems  Positive:  Negative: See above  Physical Exam  BP (!) 115/93 (BP Location: Right Arm)   Pulse (!) 113   Temp 99.5 F (37.5 C) (Oral)   Resp 18   SpO2 97%  Gen:   Awake, no distress   Resp:  Normal effort  MSK:   Moves extremities without difficulty  Other:    Medical Decision Making  Medically screening exam initiated at 3:28 PM.  Appropriate orders placed.  Cianni Popko was informed that the remainder of the evaluation will be completed by another provider, this initial triage assessment does not replace that evaluation, and the importance of remaining in the ED until their evaluation is complete.     Bud Face, PA-C 07/20/21 1529

## 2021-07-20 NOTE — ED Notes (Signed)
Pt provided saltines and water for PO trial

## 2021-07-20 NOTE — H&P (Signed)
History and Physical    Kelli Hudson SLH:[id number] DOB: 09/03/1990 DOA: 07/20/2021  Referring MD/NP/PA: EDP PCP:  Patient coming from: Home  Chief Complaint: Nausea and vomiting  HPI: Kelli Hudson is a 31/F with history of type 1 diabetes mellitus, longstanding history of gastroparesis, frequent hospitalizations for same, was admitted and discharged few weeks ago, after which she saw Dr. Havery Moros with [facility] on 5/31, recommended to try domperidone instead of Reglan which she has not started yet, was also referred to motility specialist at [facility].  In the last 10 days she was also treated with steroids by her PCP for allergic dermatitis which is improving.  She developed worsening symptoms namely nausea and vomiting last night, she ate fish and fries at a restaurant for lunch earlier in the day.  Denies any fevers or chills, has intermittent abdominal pain, finally presented to the ED after numerous episodes of vomiting ED Course: Labs notable for hyperglycemia, mild AKI, mild leukocytosis . failed p.o. trial, was given Zofran, Reglan, Haldol, Dilaudid and Benadryl, TRH consulted for admission/observation on account of failed p.o. trial  Review of Systems: As per HPI otherwise 14 point review of systems negative.   Past Medical History:  Diagnosis Date   Acute H. pylori gastric ulcer    Diabetes mellitus ([place])    Diabetic gastroparesis ([place])    Gastroparesis    GERD (gastroesophageal reflux disease)    Hypertension    Hyperthyroidism     Past Surgical History:  Procedure Laterality Date   AMPUTATION TOE Left 03/10/2018   Procedure: AMPUTATION FIFTH TOE;  Surgeon: Trula Slade, DPM;  Location: [facility];  Service: Podiatry;  Laterality: Left;   BIOPSY  01/28/2020   Procedure: BIOPSY;  Surgeon: Otis Brace, MD;  Location: [facility];  Service: Gastroenterology;;   ESOPHAGOGASTRODUODENOSCOPY N/A 01/28/2020   Procedure:  ESOPHAGOGASTRODUODENOSCOPY (EGD);  Surgeon: Otis Brace, MD;  Location: [facility];  Service: Gastroenterology;  Laterality: N/A;   ESOPHAGOGASTRODUODENOSCOPY (EGD) WITH PROPOFOL Left 09/08/2015   Procedure: ESOPHAGOGASTRODUODENOSCOPY (EGD) WITH PROPOFOL;  Surgeon: Arta Silence, MD;  Location: [facility] END[facility]COPY;  Service: Endoscopy;  Laterality: Left;   WISDOM TOOTH EXTRACTION       reports that she has never smoked. She has never used smokeless tobacco. She reports current alcohol use of about 1.0 standard drink per week. She reports that she does not use drugs.  No Known Allergies  Family History  Problem Relation Age of Onset   Lung cancer Mother    Diabetes Mother    Bipolar disorder Father    Liver cancer Maternal Grandfather    Diabetes Maternal Aunt        x 2   Pancreatic cancer Maternal Uncle    Prostate cancer Maternal Uncle    Breast cancer Other        maternal great aunt   Heart disease Other    Ovarian cancer Other        maternal great aunt   Kidney disease Other        maternal great aunt   Colon cancer Neg Hx    Stomach cancer Neg Hx      Prior to Admission medications   Medication Sig Start Date End Date Taking? Authorizing Provider  albuterol (VENTOLIN HFA) 108 (90 Base) MCG/ACT inhaler Inhale 2 puffs into the lungs every 6 (six) hours as needed for shortness of breath.   Yes [provider]  AMBULATORY NON FORMULARY MEDICATION Medication Name: Domperidone 10 mg: Take  one tablet by mouth three times a day 07/09/21  Yes Armbruster, Carlota Raspberry, MD  atorvastatin (LIPITOR) 20 MG tablet Take 1 tablet (20 mg total) by mouth daily. 04/10/21  Yes [name], MD  blood glucose meter kit and supplies Dispense based on patient and insurance preference. Use up to four times daily as directed. (FOR ICD-10 E10.9, E11.9). 06/14/21  Yes Shelly Coss, MD  carvedilol (COREG) 6.25 MG tablet Take 1 tablet (6.25 mg total) by mouth 2 (two) times daily with a meal.  07/01/21  Yes Nita Sells, MD  Continuous Blood Gluc Sensor (DEXCOM G6 SENSOR) MISC 1 each by Does not apply route daily. 11/21/20  Yes [name], MD  Continuous Blood Gluc Transmit (DEXCOM G6 TRANSMITTER) MISC 1 each by Does not apply route daily. 09/24/20  Yes [name], MD  dicyclomine (BENTYL) 20 MG tablet Take 1 tablet (20 mg total) by mouth 3 (three) times daily as needed for spasms. 04/24/21  Yes Aline August, MD  glucose blood (TRUE METRIX BLOOD GLUCOSE TEST) test strip Use as instructed Patient taking differently: 1 each by Other route See admin instructions. Use as instructed 09/03/20  Yes Rudd, Lillette Boxer, MD  hydrOXYzine (VISTARIL) 25 MG capsule Take 1 capsule (25 mg total) by mouth every 8 (eight) hours as needed. Patient taking differently: Take 25 mg by mouth every 8 (eight) hours as needed for anxiety. 07/09/21  Yes [name], MD  insulin detemir (LEVEMIR FLEXTOUCH) 100 UNIT/ML FlexPen Inject 16 Units into the skin daily. 06/14/21  Yes Shelly Coss, MD  Insulin Pen Needle (PEN NEEDLES 3/16") 31G X 5 MM MISC 1 each by Does not apply route in the morning, at noon, and at bedtime. 06/14/21  Yes Shelly Coss, MD  insulin regular (NOVOLIN R) 100 units/mL injection Inject 0-0.15 mLs (0-15 Units total) into the skin See admin instructions. Via pump.Start this medication after you follow up with your PCP Patient taking differently: Inject 0-15 Units into the skin See admin instructions. Via pump. 06/14/21  Yes Shelly Coss, MD  metoCLOPramide (REGLAN) 10 MG tablet Take 1 tablet (10 mg total) by mouth every 8 (eight) hours as needed for nausea. 06/14/21 07/20/21 Yes Shelly Coss, MD  pantoprazole (PROTONIX) 40 MG tablet Take 1 tablet (40 mg total) by mouth 2 (two) times daily before a meal. 04/24/21  Yes Starla Link, Kshitiz, MD  predniSONE (DELTASONE) 20 MG tablet Take 2 tablets (40 mg total) by mouth daily with breakfast for 5 days, THEN 1 tablet (20 mg total) daily with  breakfast for 5 days, THEN 0.5 tablets (10 mg total) daily with breakfast for 6 days. 07/09/21 07/25/21 Yes RuddLillette Boxer, MD  promethazine (PHENERGAN) 12.5 MG tablet Take 1 tablet (12.5 mg total) by mouth every 6 (six) hours as needed for nausea or vomiting. 07/01/21  Yes Nita Sells, MD  TRUEPLUS LANCETS 28G MISC assist with checking blood sugar TID and qhs Patient taking differently: 1 each by Other route See admin instructions. assist with checking blood sugar TID and qhs 09/10/15  Yes Noel, Tiffany S, PA-C  insulin aspart (NOVOLOG) 100 UNIT/ML FlexPen Inject 5 Units into the skin 3 (three) times daily with meals. 02/23/18 07/05/19  Donne Hazel, MD    Physical Exam: Vitals:   07/20/21 1845 07/20/21 1846 07/20/21 2000 07/20/21 2100  BP:  125/78 107/65 121/74  Pulse: (!) 101 (!) 101 99 96  Resp:  _0 Temp:  TempSrc:      SpO2: 99% 99% 97% 97%     Gen: Chronically ill female laying in bed, AAOx3, no distress HEENT: no JVD, healing scaly rash noted on face especially chin Lungs: Good air movement bilaterally, CTAB CVS: S1S2/RRR Abd: soft, Non tender, non distended, BS present Extremities: No edema Skin: no new rashes on exposed skin   Labs on Admission: I have personally reviewed following labs and imaging studies  CBC: Recent Labs  Lab 07/20/21 1523  WBC 13.6*  HGB 13.3  HCT 39.8  MCV 87.9  PLT 865*   Basic Metabolic Panel: Recent Labs  Lab 07/20/21 1523  NA 136  K 4.2  CL 97*  CO2 26  GLUCOSE 398*  BUN 24*  CREATININE 1.58*  CALCIUM 10.3   GFR: Estimated Creatinine Clearance: 59.6 mL/min (A) (by C-G formula based on SCr of 1.58 mg/dL (H)). Liver Function Tests: Recent Labs  Lab 07/20/21 1523  AST 24  ALT 25  ALKPHOS 129*  BILITOT 0.8  PROT 8.4*  ALBUMIN 3.5   Recent Labs  Lab 07/20/21 1523  LIPASE 30   No results for input(s): AMMONIA in the last 168 hours. Coagulation Profile: No results for input(s): INR, PROTIME in the last  168 hours. Cardiac Enzymes: No results for input(s): CKTOTAL, CKMB, CKMBINDEX, TROPONINI in the last 168 hours. BNP (last 3 results) No results for input(s): PROBNP in the last 8760 hours. HbA1C: No results for input(s): HGBA1C in the last 72 hours. CBG: Recent Labs  Lab 07/20/21 1814  GLUCAP 284*   Lipid Profile: No results for input(s): CHOL, HDL, LDLCALC, TRIG, CHOLHDL, LDLDIRECT in the last 72 hours. Thyroid Function Tests: No results for input(s): TSH, T4TOTAL, FREET4, T3FREE, THYROIDAB in the last 72 hours. Anemia Panel: No results for input(s): VITAMINB12, FOLATE, FERRITIN, TIBC, IRON, RETICCTPCT in the last 72 hours. Urine analysis:    Component Value Date/Time   COLORURINE STRAW (A) 07/20/2021 1504   APPEARANCEUR CLEAR 07/20/2021 1504   LABSPEC 1.022 07/20/2021 1504   PHURINE 6.0 07/20/2021 1504   GLUCOSEU >=500 (A) 07/20/2021 1504   GLUCOSEU NEGATIVE 05/22/2020 1417   HGBUR NEGATIVE 07/20/2021 1504   BILIRUBINUR NEGATIVE 07/20/2021 1504   KETONESUR NEGATIVE 07/20/2021 1504   PROTEINUR 100 (A) 07/20/2021 1504   UROBILINOGEN 0.2 05/22/2020 1417   NITRITE NEGATIVE 07/20/2021 1504   LEUKOCYTESUR NEGATIVE 07/20/2021 1504   Sepsis Labs: _0 (procalcitonin:4,lacticidven:4) )No results found for this or any previous visit (from the past 240 hour(s)).   Radiological Exams on Admission: No results found.  EKG: Independently reviewed.  Pending  Assessment/Plan  Nausea and vomiting Known severe diabetic gastroparesis -Clinically suspect flareup of gastroparesis, no other etiology suspected at this time, -She does have mild leukocytosis which is likely secondary to recent steroid use and hemoconcentration/dehydration, monitor clinically -Supportive care, IV fluids today, clear liquids, scheduled Reglan, as needed Zofran/Phenergan, and PPI -Discussed importance of limiting dose and frequency of narcotics -Also check EKG to assess QTc -Recently seen by  gastroenterologist Dr. Havery Moros, prescribed domperidone-has not started this, was also referred to a motility specialist at [facility]  Leukocytosis, hemoconcentration -As noted above, suspect this is secondary to stress demargination from from recent steroid use and hemoconcentration, if worsens or develops fevers will need further imaging  Type 1 diabetes mellitus Uncontrolled with hyperglycemia -Recent A1c was 9.7 in April -CBGs uncontrolled, has not taken long-acting insulin today, resume Levemir, add sliding scale, she also has a insulin pump which she uses at times  AKI (acute kidney injury) ([place]) -Secondary to dehydration, prerenal azotemia -Hydrate, monitor  Tachycardia -Was recently started on Coreg in May, will continue this  Recent allergic dermatitis -Resolving, at the tail end of her steroid course -will discontinue prednisone, especially in the setting of hyperglycemia   DVT prophylaxis: Lovenox Code Status: Full code Family Communication: Discussed patient detail, no family at bedside Disposition Plan: Home in 1 to 2 days, pending improvement in symptoms Consults called: None Admission status: Observation  Domenic Polite MD [facility]   07/20/2021, 9:29 PM

## 2021-07-20 NOTE — ED Triage Notes (Signed)
Pt c/o generalized abdominal pain with N/V since this morning. Pt states she has hx of gastroparesis and symptoms feel similar to previous episodes.

## 2021-07-21 DIAGNOSIS — N179 Acute kidney failure, unspecified: Secondary | ICD-10-CM

## 2021-07-21 DIAGNOSIS — R739 Hyperglycemia, unspecified: Secondary | ICD-10-CM

## 2021-07-21 DIAGNOSIS — E1043 Type 1 diabetes mellitus with diabetic autonomic (poly)neuropathy: Secondary | ICD-10-CM | POA: Diagnosis not present

## 2021-07-21 DIAGNOSIS — K3184 Gastroparesis: Secondary | ICD-10-CM

## 2021-07-21 DIAGNOSIS — E1042 Type 1 diabetes mellitus with diabetic polyneuropathy: Secondary | ICD-10-CM

## 2021-07-21 DIAGNOSIS — R112 Nausea with vomiting, unspecified: Secondary | ICD-10-CM | POA: Diagnosis not present

## 2021-07-21 LAB — GLUCOSE, CAPILLARY
Glucose-Capillary: 237 mg/dL — ABNORMAL HIGH (ref 70–99)
Glucose-Capillary: 261 mg/dL — ABNORMAL HIGH (ref 70–99)
Glucose-Capillary: 106 mg/dL — ABNORMAL HIGH (ref 70–99)
Glucose-Capillary: 348 mg/dL — ABNORMAL HIGH (ref 70–99)

## 2021-07-21 LAB — BASIC METABOLIC PANEL
Anion gap: 7 (ref 5–15)
BUN: 18 mg/dL (ref 6–20)
CO2: 30 mmol/L (ref 22–32)
Calcium: 9.3 mg/dL (ref 8.9–10.3)
Chloride: 98 mmol/L (ref 98–111)
Creatinine, Ser: 0.99 mg/dL (ref 0.44–1.00)
GFR, Estimated: >60 mL/min (ref >60)
Glucose, Bld: 255 mg/dL — ABNORMAL HIGH (ref 70–99)
Potassium: 4.2 mmol/L (ref 3.5–5.1)
Sodium: 135 mmol/L (ref 135–145)

## 2021-07-21 MED ORDER — IBUPROFEN 200 MG PO TABS
400.0000 mg | ORAL_TABLET | Freq: Four times a day (QID) | ORAL | Status: DC | PRN
Start: 2021-07-21 — End: 2021-07-22
  Administered 2021-07-21 (×2): 400 mg via ORAL
  Filled 2021-07-21 (×2): qty 2

## 2021-07-21 MED ORDER — INSULIN ASPART 100 UNIT/ML IJ SOLN
4.0000 [IU] | Freq: Three times a day (TID) | INTRAMUSCULAR | Status: DC
  Administered 2021-07-21 – 2021-07-23 (×6): 4 [IU] via SUBCUTANEOUS

## 2021-07-21 MED ORDER — INSULIN DETEMIR 100 UNIT/ML ~~LOC~~ SOLN
10.0000 [IU] | Freq: Two times a day (BID) | SUBCUTANEOUS | Status: DC
  Administered 2021-07-21 (×2): 10 [IU] via SUBCUTANEOUS
  Filled 2021-07-21 (×3): qty 0.1

## 2021-07-21 MED ORDER — DIPHENHYDRAMINE HCL 25 MG PO CAPS
50.0000 mg | ORAL_CAPSULE | Freq: Once | ORAL | Status: AC
  Administered 2021-07-21: 50 mg via ORAL
  Filled 2021-07-21: qty 2

## 2021-07-21 NOTE — Progress Notes (Signed)
[facility] PROGRESS NOTE    Progress Note  Kelli Hudson  ZOX:[id number] DOB: Jul 11, 1990 DOA: 07/20/2021 PCP: Loyola Mastudd, Stephen M, MD     Brief Narrative:   Kelli Hudson is an 31 y.o. female past medical history of diabetes mellitus type 1 longstanding history of gastroparesis frequent hospitalization, discharged a few weeks ago saw Dr. Adela LankArmbruster on 07/17/2020 who recommended domperidone instead of Reglan which she has not yet started, was also [facility] to [facility] specialist developed worsening nausea and vomiting last night, in the ED was hyperglycemic, mild acute kidney injury mild leukocytosis admitted for intractable nausea vomiting.     Assessment/Plan:   Intractable  Nausea and vomiting likely due to diabetic gastroparesis: She is on a leukocytosis likely due to recent use and steroids she is hemoconcentrated. She was started on IV fluids, clear Reglan and Zofran. She was also continued on a PPI. We have talked to her about limiting her narcotics as this could worsen her nausea and vomiting. KVO IV fluids tolerated her clears advance as tolerated.  Leukocytosis/hemoconcentration: Leukocytosis likely due to to steroids. Hemoconcentration likely due to dehydration.  Diabetes mellitus type 1 uncontrolled with hyperglycemia: With an A1c of 9.7. She did not take her long-acting insulin. She was continue long-acting insulin her blood glucose continues to be elevated at meal coverage.  Acute kidney injury: Likely prerenal azotemia, with a baseline creatinine of less than 1. \On IV fluid resuscitation, labs are pending this morning.  Sinus tachycardia: Likely due to dehydration. Resume her Coreg.  Rate is controlled now after fluid resuscitation and beta-blocker.  Recent allergic dermatitis: Resolving with a course of steroids agree with stopping steroids.   DVT prophylaxis: lovenox Family Communication:none Status is:  Observation The patient remains OBS appropriate and will d/c before 2 midnights.    Code Status:     Code Status Orders  (From admission, onward)           Start     Ordered   07/20/21 2239  Full code  Continuous        07/20/21 2238           Code Status History     Date Active Date Inactive Code Status Order ID Comments User Context   06/28/2021 1535 07/01/2021 2031 Full Code [id number]  Teddy SpikeKyle, Tyrone A, DO ED   06/10/2021 2104 06/14/2021 1623 Full Code [id number]  Anselm Junglingu, Ching T, DO Inpatient   06/04/2021 1715 06/07/2021 1605 Full Code [id number]  Calvert Cantorizwan, Saima, MD ED   05/28/2021 1111 05/30/2021 1549 Full Code [id number]  Teddy SpikeKyle, Tyrone A, DO ED   05/28/2021 0341 05/28/2021 1111 Full Code [id number]  Howerter, Chaney BornJustin B, DO ED   04/20/2021 2151 04/24/2021 2104 Full Code [id number]  Charlsie QuestPatel, Vishal R, MD ED   02/24/2021 2320 02/28/2021 2215 Full Code [id number]  Teddy SpikeKyle, Tyrone A, DO Inpatient   02/24/2021 1737 02/24/2021 2320 Full Code [id number]  Teddy SpikeKyle, Tyrone A, DO ED   11/16/2020 0424 11/17/2020 1428 Full Code [id number]  Hillary BowGardner, Jared M, DO ED   09/08/2020 2328 09/11/2020 1951 Full Code [id number]  Hillary BowGardner, Jared M, DO ED   08/12/2020 2304 08/15/2020 1517 Full Code [id number]  Hillary BowGardner, Jared M, DO ED   07/25/2020 1810 07/28/2020 1545 Full Code [id number]  Teddy SpikeKyle, Tyrone A, DO Inpatient   07/23/2020 2038 07/24/2020 1519 Full Code [id number]  Anselm Junglingu, Ching T, DO ED   06/05/2020 2349 06/09/2020 2026 Full Code [id number]  Hugelmeyer, Alexis, DO ED   05/27/2020 1913 05/28/2020  2248 Full Code [id number]  Marguerita Merles [place], [place] Inpatient   05/09/2020 2149 05/14/2020 2059 Full Code [id number]  Chotiner, Claudean Severance, MD ED   04/04/2020 1214 04/06/2020 1934 Full Code [id number]  Teddy Spike, DO ED   02/24/2020 1003 02/27/2020 1622 Full Code [id number]  Teddy Spike, DO ED   01/25/2020 0113 01/30/2020 1637 Full Code [id number]  Briscoe Deutscher, MD ED   02/21/2018 1904 02/23/2018 2052 Full Code [id number]  Ike Bene, MD ED   02/21/2018 1833 02/21/2018  1904 Full Code [id number]  Ike Bene, MD ED   01/02/2016 0842 01/05/2016 1438 Full Code [id number]  Lora Paula, MD Inpatient   09/02/2015 1729 09/09/2015 2148 Full Code [id number]  Meredith Pel, NP ED         IV Access:   Peripheral IV   Procedures and diagnostic studies:   No results found.   Medical Consultants:   None.   Subjective:    Kelli Hudson tolerated her clear liquid diet.  Objective:    Vitals:   07/20/21 2254 07/20/21 2305 07/21/21 0254 07/21/21 0654  BP: 118/75  120/79 (!) 151/94  Pulse: (!) 101  88 98  Resp: 18  16 16   Temp: 98.1 F (36.7 C)  97.7 F (36.5 C) 97.8 F (36.6 C)  TempSrc: Oral  Oral Oral  SpO2: 100%  100% 99%  Weight: 80.1 kg 85.4 kg    Height:  5\' 8"  (1.727 m)     SpO2: 99 %  No intake or output data in the 24 hours ending 07/21/21 0736 Filed Weights   07/20/21 2254 07/20/21 2305  Weight: 80.1 kg 85.4 kg    Exam: General exam: In no acute distress. Respiratory system: Good air movement and clear to auscultation. Cardiovascular system: S1 & S2 heard, RRR. No JVD. Gastrointestinal system: Abdomen is nondistended, soft and nontender.  Extremities: No pedal edema. Skin: No rashes, lesions or ulcers Psychiatry: Judgement and insight appear normal. Mood & affect appropriate.    Data Reviewed:    Labs: Basic Metabolic Panel: Recent Labs  Lab 07/20/21 1523  NA 136  K 4.2  CL 97*  CO2 26  GLUCOSE 398*  BUN 24*  CREATININE 1.58*  CALCIUM 10.3   GFR Estimated Creatinine Clearance: 59.6 mL/min (A) (by C-G formula based on SCr of 1.58 mg/dL (H)). Liver Function Tests: Recent Labs  Lab 07/20/21 1523  AST 24  ALT 25  ALKPHOS 129*  BILITOT 0.8  PROT 8.4*  ALBUMIN 3.5   Recent Labs  Lab 07/20/21 1523  LIPASE 30   No results for input(s): AMMONIA in the last 168 hours. Coagulation profile No results for input(s): INR, PROTIME in the last 168 hours. COVID-19 Labs  No results for  input(s): DDIMER, FERRITIN, LDH, CRP in the last 72 hours.  Lab Results  Component Value Date   SARSCOV2NAA NEGATIVE 04/20/2021   SARSCOV2NAA NEGATIVE 02/24/2021   SARSCOV2NAA NEGATIVE 11/16/2020   SARSCOV2NAA NEGATIVE 09/09/2020    CBC: Recent Labs  Lab 07/20/21 1523  WBC 13.6*  HGB 13.3  HCT 39.8  MCV 87.9  PLT 465*   Cardiac Enzymes: No results for input(s): CKTOTAL, CKMB, CKMBINDEX, TROPONINI in the last 168 hours. BNP (last 3 results) No results for input(s): PROBNP in the last 8760 hours. CBG: Recent Labs  Lab 07/20/21 1814 07/20/21 2141 07/21/21 0714  GLUCAP 284* 296* 237*   D-Dimer: No results for input(s): DDIMER in the last 72 hours. Hgb A1c:  No results for input(s): HGBA1C in the last 72 hours. Lipid Profile: No results for input(s): CHOL, HDL, LDLCALC, TRIG, CHOLHDL, LDLDIRECT in the last 72 hours. Thyroid function studies: No results for input(s): TSH, T4TOTAL, T3FREE, THYROIDAB in the last 72 hours.  Invalid input(s): FREET3 Anemia work up: No results for input(s): VITAMINB12, FOLATE, FERRITIN, TIBC, IRON, RETICCTPCT in the last 72 hours. Sepsis Labs: Recent Labs  Lab 07/20/21 1523  WBC 13.6*   Microbiology No results found for this or any previous visit (from the past 240 hour(s)).   Medications:    atorvastatin  20 mg Oral Daily   carvedilol  6.25 mg Oral BID WC   enoxaparin (LOVENOX) injection  40 mg Subcutaneous Q24H   insulin aspart  0-15 Units Subcutaneous TID WC   insulin aspart  0-5 Units Subcutaneous QHS   insulin detemir  12 Units Subcutaneous Daily   metoCLOPramide (REGLAN) injection  10 mg Intravenous TID AC   pantoprazole  40 mg Oral BID AC   Continuous Infusions:    LOS: 0 days   Marinda Elk  [facility]  07/21/2021, 7:36 AM

## 2021-07-21 NOTE — Progress Notes (Signed)
rm 1612 Kelli Hudson, 31yoF A&OX4, Wants Benadryl or something for sleep.  PO 90/60.

## 2021-07-22 DIAGNOSIS — T380X5A Adverse effect of glucocorticoids and synthetic analogues, initial encounter: Secondary | ICD-10-CM | POA: Diagnosis present

## 2021-07-22 DIAGNOSIS — K3184 Gastroparesis: Secondary | ICD-10-CM | POA: Diagnosis present

## 2021-07-22 DIAGNOSIS — E1065 Type 1 diabetes mellitus with hyperglycemia: Secondary | ICD-10-CM | POA: Diagnosis present

## 2021-07-22 DIAGNOSIS — L239 Allergic contact dermatitis, unspecified cause: Secondary | ICD-10-CM | POA: Diagnosis present

## 2021-07-22 DIAGNOSIS — E860 Dehydration: Secondary | ICD-10-CM | POA: Diagnosis present

## 2021-07-22 DIAGNOSIS — E0590 Thyrotoxicosis, unspecified without thyrotoxic crisis or storm: Secondary | ICD-10-CM | POA: Diagnosis present

## 2021-07-22 DIAGNOSIS — E1043 Type 1 diabetes mellitus with diabetic autonomic (poly)neuropathy: Secondary | ICD-10-CM | POA: Diagnosis present

## 2021-07-22 DIAGNOSIS — I10 Essential (primary) hypertension: Secondary | ICD-10-CM | POA: Diagnosis present

## 2021-07-22 DIAGNOSIS — Z89422 Acquired absence of other left toe(s): Secondary | ICD-10-CM | POA: Diagnosis not present

## 2021-07-22 DIAGNOSIS — K219 Gastro-esophageal reflux disease without esophagitis: Secondary | ICD-10-CM | POA: Diagnosis present

## 2021-07-22 DIAGNOSIS — N179 Acute kidney failure, unspecified: Secondary | ICD-10-CM | POA: Diagnosis present

## 2021-07-22 DIAGNOSIS — R739 Hyperglycemia, unspecified: Secondary | ICD-10-CM | POA: Diagnosis not present

## 2021-07-22 DIAGNOSIS — Z794 Long term (current) use of insulin: Secondary | ICD-10-CM | POA: Diagnosis not present

## 2021-07-22 DIAGNOSIS — Z833 Family history of diabetes mellitus: Secondary | ICD-10-CM | POA: Diagnosis not present

## 2021-07-22 DIAGNOSIS — R112 Nausea with vomiting, unspecified: Secondary | ICD-10-CM | POA: Diagnosis not present

## 2021-07-22 DIAGNOSIS — Z7952 Long term (current) use of systemic steroids: Secondary | ICD-10-CM | POA: Diagnosis not present

## 2021-07-22 DIAGNOSIS — Z79899 Other long term (current) drug therapy: Secondary | ICD-10-CM | POA: Diagnosis not present

## 2021-07-22 LAB — GLUCOSE, CAPILLARY
Glucose-Capillary: 264 mg/dL — ABNORMAL HIGH (ref 70–99)
Glucose-Capillary: 200 mg/dL — ABNORMAL HIGH (ref 70–99)
Glucose-Capillary: 282 mg/dL — ABNORMAL HIGH (ref 70–99)
Glucose-Capillary: 181 mg/dL — ABNORMAL HIGH (ref 70–99)

## 2021-07-22 MED ORDER — INSULIN DETEMIR 100 UNIT/ML ~~LOC~~ SOLN
15.0000 [IU] | Freq: Two times a day (BID) | SUBCUTANEOUS | Status: DC
  Administered 2021-07-22 – 2021-07-23 (×3): 15 [IU] via SUBCUTANEOUS
  Filled 2021-07-22 (×4): qty 0.15

## 2021-07-22 MED ORDER — HYDROCODONE-ACETAMINOPHEN 7.5-325 MG PO TABS
1.0000 | ORAL_TABLET | ORAL | Status: DC | PRN
  Administered 2021-07-22 (×3): 1 via ORAL
  Filled 2021-07-22 (×3): qty 1

## 2021-07-22 MED ORDER — IBUPROFEN 800 MG PO TABS: 800.0000 mg | ORAL_TABLET | Freq: Four times a day (QID) | ORAL | Status: DC | PRN

## 2021-07-22 NOTE — Progress Notes (Signed)
Ultrasound of both arms lower and upper arms for 30" -nothing noted suitable for IV  access below A/C anterior or posteriorly. IV access provided Lt upper arm with 2.5" , # 20 catheter.

## 2021-07-22 NOTE — Plan of Care (Signed)
  Problem: Coping: Goal: Ability to adjust to condition or change in health will improve Outcome: Progressing   Problem: Health Behavior/Discharge Planning: Goal: Ability to identify and utilize available resources and services will improve Outcome: Progressing   

## 2021-07-22 NOTE — Progress Notes (Signed)
Inpatient Diabetes Program Recommendations  AACE/ADA: New Consensus Statement on Inpatient Glycemic Control (2015)  Target Ranges:  Prepandial:   less than 140 mg/dL      Peak postprandial:   less than 180 mg/dL (1-2 hours)      Critically ill patients:  140 - 180 mg/dL    Latest Reference Range & Units 07/21/21 07:14 07/21/21 12:52 07/21/21 16:38 07/21/21 21:09  Glucose-Capillary 70 - 99 mg/dL 703 (H) 500 (H) 938 (H) 348 (H)    Latest Reference Range & Units 07/22/21 07:07 07/22/21 12:10  Glucose-Capillary 70 - 99 mg/dL 182 (H) 993 (H)     Admit Intractable N&V likely due to diabetic gastroparesis  History: Type 1 Diabetes  Home DM Meds: Insulin Pump (Tandem)    Dexcom G6   Current Orders: Levemir 15 units BID  Novolog 0-15 units TID ac/hs  Novolog 4 units TID with meals    Started Novolog Meal coverage yest AM--Levemir increased to 10 BID yest AM--Further increased to 15 BID this AM.    This may be too much as she only gets 12 units total basal on the Insulin Pump.  Spoke with pt today--Her pump is at home--No changes to pump since last admit--Plans to resume her pump when she goes home--Next ENDO appt with Dr. Lonzo Cloud is July 2023.  Explained to pt that she needs to be mindful when resuming her pump when she goes home as she is receiving Basal insulin here in hospital (Levemir) and will need to know when last dose was given in hospital so she can time the resumption her pump at home so as not to overlap the basal rates on her pump and the Levemir she was given.  Pt stated understanding and can suspend basal rates on her pump if needed until the Levemir leaves her system.   --Will follow patient during hospitalization--  Ambrose Finland RN, MSN, CDE Diabetes Coordinator Inpatient Glycemic Control Team Team Pager: [phone] (8a-5p)

## 2021-07-22 NOTE — Progress Notes (Signed)
[facility] PROGRESS NOTE    Progress Note  Kelli Hudson  ZOX:[id number] DOB: 04-18-1990 DOA: 07/20/2021 PCP: Loyola Mastudd, Stephen M, MD     Brief Narrative:   Kelli Hudson is an 31 y.o. female past medical history of diabetes mellitus type 1 longstanding history of gastroparesis frequent hospitalization, discharged a few weeks ago saw Dr. Adela LankArmbruster on 07/17/2020 who recommended domperidone instead of Reglan which she has not yet started, [facility] developed worsening nausea and vomiting last night, in the ED was hyperglycemic, mild acute kidney injury mild leukocytosis admitted for intractable nausea vomiting.  Assessment/Plan:   Intractable  Nausea and vomiting likely due to diabetic gastroparesis: She is on a leukocytosis likely due to recent use and steroids she is hemoconcentrated. She was started on IV fluids, clear Reglan and Zofran. She was also continued on a PPI. We have talked to her about limiting her narcotics as this could worsen her nausea and vomiting. KVO IV fluids tolerated her clears advance as tolerated.  Leukocytosis/hemoconcentration: Leukocytosis likely due to to steroids. Hemoconcentration likely due to dehydration.  Diabetes mellitus type 1 uncontrolled with hyperglycemia: With an A1c of 9.7. Blood glucose still elevated increase her long-acting insulin continue sliding scale.  Acute kidney injury: Likely prerenal azotemia, with a baseline creatinine of less than 1. Resolved with fluid resuscitation.  Sinus tachycardia: Likely due to dehydration. Resume her Coreg.  Rate is controlled now after fluid resuscitation and beta-blocker.  Recent allergic dermatitis: Resolving with a course of steroids agree with stopping steroids.   DVT prophylaxis: lovenox Family Communication:none Status is: Observation The patient remains OBS appropriate and will d/c before 2 midnights.    Code  Status:     Code Status Orders  (From admission, onward)           Start     Ordered   07/20/21 2239  Full code  Continuous        07/20/21 2238           Code Status History     Date Active Date Inactive Code Status Order ID Comments User Context   06/28/2021 1535 07/01/2021 2031 Full Code [id number]  Teddy SpikeKyle, Tyrone A, DO ED   06/10/2021 2104 06/14/2021 1623 Full Code [id number]  Anselm Junglingu, Ching T, DO Inpatient   06/04/2021 1715 06/07/2021 1605 Full Code [id number]  Calvert Cantorizwan, Saima, MD ED   05/28/2021 1111 05/30/2021 1549 Full Code [id number]  Teddy SpikeKyle, Tyrone A, DO ED   05/28/2021 0341 05/28/2021 1111 Full Code [id number]  Howerter, Chaney BornJustin B, DO ED   04/20/2021 2151 04/24/2021 2104 Full Code [id number]  Charlsie QuestPatel, Vishal R, MD ED   02/24/2021 2320 02/28/2021 2215 Full Code [id number]  Teddy SpikeKyle, Tyrone A, DO Inpatient   02/24/2021 1737 02/24/2021 2320 Full Code [id number]  Teddy SpikeKyle, Tyrone A, DO ED   11/16/2020 0424 11/17/2020 1428 Full Code [id number]  Hillary BowGardner, Jared M, DO ED   09/08/2020 2328 09/11/2020 1951 Full Code [id number]  Hillary BowGardner, Jared M, DO ED   08/12/2020 2304 08/15/2020 1517 Full Code [id number]  Hillary BowGardner, Jared M, DO ED   07/25/2020 1810 07/28/2020 1545 Full Code [id number]  Teddy SpikeKyle, Tyrone A, DO Inpatient   07/23/2020 2038 07/24/2020 1519 Full Code [id number]  Anselm Junglingu, Ching T, DO ED   06/05/2020 2349 06/09/2020 2026 Full Code [id number]  Hugelmeyer, Alexis, DO ED   05/27/2020 1913 05/28/2020 2248 Full Code [id number]  Merlene LaughterSheikh, Omair Latif, DO Inpatient   05/09/2020 2149 05/14/2020 2059 Full Code [id number]  Chotiner, Claudean Severance, MD ED   04/04/2020 1214 04/06/2020 1934 Full Code [id number]  Teddy Spike, DO ED   02/24/2020 1003 02/27/2020 1622 Full Code [id number]  Teddy Spike, DO ED   01/25/2020 0113 01/30/2020 1637 Full Code [id number]  Briscoe Deutscher, MD ED   02/21/2018 1904 02/23/2018 2052 Full Code [id number]  Ike Bene, MD ED   02/21/2018 1833 02/21/2018 1904 Full Code [id number]  Ike Bene, MD ED   01/02/2016 0842 01/05/2016 1438 Full Code  [id number]  Lora Paula, MD Inpatient   09/02/2015 1729 09/09/2015 2148 Full Code [id number]  Meredith Pel, NP ED         IV Access:   Peripheral IV   Procedures and diagnostic studies:   No results found.   Medical Consultants:   None.   Subjective:    Kelli Hudson tolerating her diet but still having abdominal pain.  Objective:    Vitals:   07/21/21 1426 07/21/21 1700 07/21/21 2108 07/22/21 0451  BP: 114/75 (!) 138/102 (!) 90/55 125/90  Pulse: 90 94 94 90  Resp: 16  18 16   Temp: 98.6 F (37 C)  98 F (36.7 C) 97.9 F (36.6 C)  TempSrc: Oral  Oral Oral  SpO2: 100%  98% 100%  Weight:      Height:       SpO2: 100 %   Intake/Output Summary (Last 24 hours) at 07/22/2021 0751 Last data filed at 07/21/2021 1741 Gross per 24 hour  Intake 1360 ml  Output --  Net 1360 ml   Filed Weights   07/20/21 2254 07/20/21 2305  Weight: 80.1 kg 85.4 kg    Exam: General exam: In no acute distress. Respiratory system: Good air movement and clear to auscultation. Cardiovascular system: S1 & S2 heard, RRR. No JVD. Gastrointestinal system: Abdomen is nondistended, soft and nontender.  Extremities: No pedal edema. Skin: No rashes, lesions or ulcers Psychiatry: Judgement and insight appear normal. Mood & affect appropriate.   Data Reviewed:    Labs: Basic Metabolic Panel: Recent Labs  Lab 07/20/21 1523 07/21/21 0821  NA 136 135  K 4.2 4.2  CL 97* 98  CO2 26 30  GLUCOSE 398* 255*  BUN 24* 18  CREATININE 1.58* 0.99  CALCIUM 10.3 9.3    GFR Estimated Creatinine Clearance: 95.1 mL/min (by C-G formula based on SCr of 0.99 mg/dL). Liver Function Tests: Recent Labs  Lab 07/20/21 1523  AST 24  ALT 25  ALKPHOS 129*  BILITOT 0.8  PROT 8.4*  ALBUMIN 3.5    Recent Labs  Lab 07/20/21 1523  LIPASE 30    No results for input(s): AMMONIA in the last 168 hours. Coagulation profile No results for input(s): INR, PROTIME in the last 168  hours. COVID-19 Labs  No results for input(s): DDIMER, FERRITIN, LDH, CRP in the last 72 hours.  Lab Results  Component Value Date   SARSCOV2NAA NEGATIVE 04/20/2021   SARSCOV2NAA NEGATIVE 02/24/2021   SARSCOV2NAA NEGATIVE 11/16/2020   SARSCOV2NAA NEGATIVE 09/09/2020    CBC: Recent Labs  Lab 07/20/21 1523  WBC 13.6*  HGB 13.3  HCT 39.8  MCV 87.9  PLT 465*    Cardiac Enzymes: No results for input(s): CKTOTAL, CKMB, CKMBINDEX, TROPONINI in the last 168 hours. BNP (last 3 results) No results for input(s): PROBNP in the last 8760 hours. CBG: Recent Labs  Lab 07/21/21 0714 07/21/21 1252 07/21/21 1638 07/21/21 2109 07/22/21 0707  GLUCAP 237* 261* 106* 348* 264*  D-Dimer: No results for input(s): DDIMER in the last 72 hours. Hgb A1c: No results for input(s): HGBA1C in the last 72 hours. Lipid Profile: No results for input(s): CHOL, HDL, LDLCALC, TRIG, CHOLHDL, LDLDIRECT in the last 72 hours. Thyroid function studies: No results for input(s): TSH, T4TOTAL, T3FREE, THYROIDAB in the last 72 hours.  Invalid input(s): FREET3 Anemia work up: No results for input(s): VITAMINB12, FOLATE, FERRITIN, TIBC, IRON, RETICCTPCT in the last 72 hours. Sepsis Labs: Recent Labs  Lab 07/20/21 1523  WBC 13.6*    Microbiology No results found for this or any previous visit (from the past 240 hour(s)).   Medications:    atorvastatin  20 mg Oral Daily   carvedilol  6.25 mg Oral BID WC   enoxaparin (LOVENOX) injection  40 mg Subcutaneous Q24H   insulin aspart  0-15 Units Subcutaneous TID WC   insulin aspart  0-5 Units Subcutaneous QHS   insulin aspart  4 Units Subcutaneous TID WC   insulin detemir  10 Units Subcutaneous BID   metoCLOPramide (REGLAN) injection  10 mg Intravenous TID AC   pantoprazole  40 mg Oral BID AC   Continuous Infusions:    LOS: 0 days   Marinda Elk  [facility]  07/22/2021, 7:51 AM

## 2021-07-23 DIAGNOSIS — E1043 Type 1 diabetes mellitus with diabetic autonomic (poly)neuropathy: Secondary | ICD-10-CM | POA: Diagnosis not present

## 2021-07-23 DIAGNOSIS — R739 Hyperglycemia, unspecified: Secondary | ICD-10-CM | POA: Diagnosis not present

## 2021-07-23 DIAGNOSIS — R112 Nausea with vomiting, unspecified: Secondary | ICD-10-CM | POA: Diagnosis not present

## 2021-07-23 DIAGNOSIS — N179 Acute kidney failure, unspecified: Secondary | ICD-10-CM | POA: Diagnosis not present

## 2021-07-23 LAB — GLUCOSE, CAPILLARY: Glucose-Capillary: 269 mg/dL — ABNORMAL HIGH (ref 70–99)

## 2021-07-23 NOTE — Discharge Summary (Signed)
Physician Discharge Summary  Kelli Hudson DVV:[id number] DOB: [birth year] DOA: 07/20/2021  PCP: [name], MD  Admit date: 07/20/2021 Discharge date: 07/23/2021  Admitted From: Home Disposition:  Home  Recommendations for Outpatient Follow-up:  Follow up with GI in 1-2 weeks Please obtain BMP/CBC in one week   Home Health:No Equipment/Devices:None  Discharge Condition:Stable CODE STATUS:Full Diet recommendation: Heart Healthy  Brief/Interim Summary:  31 y.o. female past medical history of diabetes mellitus type 1 longstanding history of gastroparesis frequent hospitalization, discharged a few weeks ago saw Dr. Havery Moros on 07/17/2020 who recommended domperidone instead of Reglan which she has not yet started, was also referred to the [facility] hospital motility specialist developed worsening nausea and vomiting last night, in the ED was hyperglycemic, mild acute kidney injury mild leukocytosis admitted for intractable nausea vomiting.  Discharge Diagnoses:  Principal Problem:   Nausea and vomiting Active Problems:   Intractable nausea and vomiting   Hyperglycemia   Diabetic gastroparesis associated with type 1 diabetes mellitus (HCC)   Type 1 diabetes mellitus with diabetic polyneuropathy (HCC)   AKI (acute kidney injury) (HCC)  Intractable nausea vomiting likely due to diabetic gastroparesis flare: Her leukocytosis was elevated likely due to steroid use and use. She was hemoconcentrated on admission she was started on aggressive IV fluid resuscitation recommend Zofran and PPI. Her narcotics were stopped. She tolerated her diet for 24 hours and she was discharged in stable condition.  Leukocytosis/hemoconcentration: Leukocytosis likely due to steroid treatment afebrile now resolved. Hemoconcentration likely due to dehydration this resolved with fluid resuscitation.  Diabetes mellitus type 1 uncontrolled with hyperglycemia: With an A1c of 9.7 she was kept on  long-acting insulin and sliding scale no changes made to her home regimen.  Acute kidney injury: Review prerenal azotemia with a baseline creatinine less than 1 did resolved with resuscitation.  Sinus tachycardia: Likely due to dehydration her Coreg was held initially after fluid resuscitation her beta-blocker was resumed.    Discharge Instructions  Discharge Instructions     Diet - low sodium heart healthy   Complete by: As directed    Increase activity slowly   Complete by: As directed       Allergies as of 07/23/2021   No Known Allergies      Medication List     STOP taking these medications    predniSONE 20 MG tablet Commonly known as: DELTASONE       TAKE these medications    albuterol 108 (90 Base) MCG/ACT inhaler Commonly known as: VENTOLIN HFA Inhale 2 puffs into the lungs every 6 (six) hours as needed for shortness of breath.   AMBULATORY NON FORMULARY MEDICATION Medication Name: Domperidone 10 mg: Take one tablet by mouth three times a day   atorvastatin 20 MG tablet Commonly known as: LIPITOR Take 1 tablet (20 mg total) by mouth daily.   blood glucose meter kit and supplies Dispense based on patient and insurance preference. Use up to four times daily as directed. (FOR ICD-10 E10.9, E11.9).   carvedilol 6.25 MG tablet Commonly known as: COREG Take 1 tablet (6.25 mg total) by mouth 2 (two) times daily with a meal.   Dexcom G6 Sensor Misc 1 each by Does not apply route daily.   Dexcom G6 Transmitter Misc 1 each by Does not apply route daily.   dicyclomine 20 MG tablet Commonly known as: BENTYL Take 1 tablet (20 mg total) by mouth 3 (three) times daily as needed for spasms.   hydrOXYzine 25 MG capsule  Commonly known as: VISTARIL Take 1 capsule (25 mg total) by mouth every 8 (eight) hours as needed. What changed: reasons to take this   insulin regular 100 units/mL injection Commonly known as: NovoLIN R Inject 0-0.15 mLs (0-15 Units total)  into the skin See admin instructions. Via pump.Start this medication after you follow up with your PCP What changed: additional instructions   Levemir FlexTouch 100 UNIT/ML FlexPen Generic drug: insulin detemir Inject 16 Units into the skin daily.   metoCLOPramide 10 MG tablet Commonly known as: Reglan Take 1 tablet (10 mg total) by mouth every 8 (eight) hours as needed for nausea.   pantoprazole 40 MG tablet Commonly known as: PROTONIX Take 1 tablet (40 mg total) by mouth 2 (two) times daily before a meal.   Pen Needles 3/16" 31G X 5 MM Misc 1 each by Does not apply route in the morning, at noon, and at bedtime.   promethazine 12.5 MG tablet Commonly known as: PHENERGAN Take 1 tablet (12.5 mg total) by mouth every 6 (six) hours as needed for nausea or vomiting.   True Metrix Blood Glucose Test test strip Generic drug: glucose blood Use as instructed What changed:  how much to take how to take this when to take this   TRUEplus Lancets 28G Misc assist with checking blood sugar TID and qhs What changed:  how much to take how to take this when to take this        No Known Allergies  Consultations: None   Procedures/Studies: No results found. (Echo, Carotid, EGD, Colonoscopy, ERCP)    Subjective: No complaints, tolerating her diet.  Discharge Exam: Vitals:   07/22/21 2041 07/23/21 0417  BP: (!) 124/92 125/81  Pulse: 99 100  Resp: 18 18  Temp: 98.2 F (36.8 C) 98.9 F (37.2 C)  SpO2: 100% 100%   Vitals:   07/22/21 1213 07/22/21 1743 07/22/21 2041 07/23/21 0417  BP: 119/81 128/88 (!) 124/92 125/81  Pulse: 86 94 99 100  Resp: 16  18 18   Temp: 98.2 F (36.8 C)  98.2 F (36.8 C) 98.9 F (37.2 C)  TempSrc: Oral  Oral Oral  SpO2: 97% 100% 100% 100%  Weight:      Height:        General: Pt is alert, awake, not in acute distress Cardiovascular: RRR, S1/S2 +, no rubs, no gallops Respiratory: CTA bilaterally, no wheezing, no rhonchi Abdominal:  Soft, NT, ND, bowel sounds + Extremities: no edema, no cyanosis    The results of significant diagnostics from this hospitalization (including imaging, microbiology, ancillary and laboratory) are listed below for reference.     Microbiology: No results found for this or any previous visit (from the past 240 hour(s)).   Labs: BNP (last 3 results) No results for input(s): BNP in the last 8760 hours. Basic Metabolic Panel: Recent Labs  Lab 07/20/21 1523 07/21/21 0821  NA 136 135  K 4.2 4.2  CL 97* 98  CO2 26 30  GLUCOSE 398* 255*  BUN 24* 18  CREATININE 1.58* 0.99  CALCIUM 10.3 9.3   Liver Function Tests: Recent Labs  Lab 07/20/21 1523  AST 24  ALT 25  ALKPHOS 129*  BILITOT 0.8  PROT 8.4*  ALBUMIN 3.5   Recent Labs  Lab 07/20/21 1523  LIPASE 30   No results for input(s): AMMONIA in the last 168 hours. CBC: Recent Labs  Lab 07/20/21 1523  WBC 13.6*  HGB 13.3  HCT 39.8  MCV 87.9  PLT 465*   Cardiac Enzymes: No results for input(s): CKTOTAL, CKMB, CKMBINDEX, TROPONINI in the last 168 hours. BNP: Invalid input(s): POCBNP CBG: Recent Labs  Lab 07/21/21 2109 07/22/21 0707 07/22/21 1210 07/22/21 1623 07/22/21 2042  GLUCAP 348* 264* 200* 282* 181*   D-Dimer No results for input(s): DDIMER in the last 72 hours. Hgb A1c No results for input(s): HGBA1C in the last 72 hours. Lipid Profile No results for input(s): CHOL, HDL, LDLCALC, TRIG, CHOLHDL, LDLDIRECT in the last 72 hours. Thyroid function studies No results for input(s): TSH, T4TOTAL, T3FREE, THYROIDAB in the last 72 hours.  Invalid input(s): FREET3 Anemia work up No results for input(s): VITAMINB12, FOLATE, FERRITIN, TIBC, IRON, RETICCTPCT in the last 72 hours. Urinalysis    Component Value Date/Time   COLORURINE STRAW (A) 07/20/2021 1504   APPEARANCEUR CLEAR 07/20/2021 1504   LABSPEC 1.022 07/20/2021 1504   PHURINE 6.0 07/20/2021 1504   GLUCOSEU >=500 (A) 07/20/2021 1504   GLUCOSEU  NEGATIVE 05/22/2020 1417   HGBUR NEGATIVE 07/20/2021 1504   BILIRUBINUR NEGATIVE 07/20/2021 1504   KETONESUR NEGATIVE 07/20/2021 1504   PROTEINUR 100 (A) 07/20/2021 1504   UROBILINOGEN 0.2 05/22/2020 1417   NITRITE NEGATIVE 07/20/2021 1504   LEUKOCYTESUR NEGATIVE 07/20/2021 1504   Sepsis Labs Invalid input(s): PROCALCITONIN,  WBC,  LACTICIDVEN Microbiology No results found for this or any previous visit (from the past 240 hour(s)).  SIGNED:   Charlynne Cousins, MD  [facility] 07/23/2021, 7:42 AM Pager   If 7PM-7AM, please contact night-coverage [email or web address] Password [email or web address]

## 2021-07-23 NOTE — Plan of Care (Signed)
Patient being discharged to home. PIV removed per order. DC education completed and all questions answered. Patient sent home with all personal belongings.    Problem: Education: Goal: Ability to describe self-care measures that may prevent or decrease complications (Diabetes Survival Skills Education) will improve Outcome: Completed/Met Goal: Individualized Educational Video(s) Outcome: Completed/Met   Problem: Coping: Goal: Ability to adjust to condition or change in health will improve Outcome: Completed/Met   Problem: Fluid Volume: Goal: Ability to maintain a balanced intake and output will improve Outcome: Completed/Met   Problem: Health Behavior/Discharge Planning: Goal: Ability to identify and utilize available resources and services will improve Outcome: Completed/Met Goal: Ability to manage health-related needs will improve Outcome: Completed/Met   Problem: Metabolic: Goal: Ability to maintain appropriate glucose levels will improve Outcome: Completed/Met   Problem: Nutritional: Goal: Maintenance of adequate nutrition will improve Outcome: Completed/Met Goal: Progress toward achieving an optimal weight will improve Outcome: Completed/Met   Problem: Skin Integrity: Goal: Risk for impaired skin integrity will decrease Outcome: Completed/Met   Problem: Tissue Perfusion: Goal: Adequacy of tissue perfusion will improve Outcome: Completed/Met   Problem: Education: Goal: Knowledge of General Education information will improve Description: Including pain rating scale, medication(s)/side effects and non-pharmacologic comfort measures Outcome: Completed/Met   Problem: Health Behavior/Discharge Planning: Goal: Ability to manage health-related needs will improve Outcome: Completed/Met   Problem: Clinical Measurements: Goal: Ability to maintain clinical measurements within normal limits will improve Outcome: Completed/Met Goal: Will remain free from  infection Outcome: Completed/Met Goal: Diagnostic test results will improve Outcome: Completed/Met Goal: Respiratory complications will improve Outcome: Completed/Met Goal: Cardiovascular complication will be avoided Outcome: Completed/Met   Problem: Activity: Goal: Risk for activity intolerance will decrease Outcome: Completed/Met   Problem: Nutrition: Goal: Adequate nutrition will be maintained Outcome: Completed/Met   Problem: Coping: Goal: Level of anxiety will decrease Outcome: Completed/Met   Problem: Elimination: Goal: Will not experience complications related to bowel motility Outcome: Completed/Met Goal: Will not experience complications related to urinary retention Outcome: Completed/Met   Problem: Pain Managment: Goal: General experience of comfort will improve Outcome: Completed/Met   Problem: Safety: Goal: Ability to remain free from injury will improve Outcome: Completed/Met   Problem: Skin Integrity: Goal: Risk for impaired skin integrity will decrease Outcome: Completed/Met

## 2021-07-24 ENCOUNTER — Telehealth: Payer: Self-pay

## 2021-07-24 NOTE — Telephone Encounter (Signed)
Transition Care Management Unsuccessful Follow-up Telephone Call  Date of discharge and from where:  [place] 07/23/2021  Attempts:  1st Attempt  Reason for unsuccessful TCM follow-up call:  No answer/busy    

## 2021-07-24 NOTE — ED Provider Notes (Signed)
[facility] Provider Note   CSN: [id number] Arrival date & time: 07/20/21  1453     History  Chief Complaint  Patient presents with   Abdominal Pain   Emesis    Verlean Allport is a 31 y.o. female.  HPI     31yo female with history of DM, pepetic ulcer, gastroparesis, htn, hlpd, presents with concern for nausea, vomiting and abdominal pain. Reports symptoms began this AM. Feels like her prior episodes of gastroparesis.  Glucose has also been elevated from taking steroids for an allergic reaction.  No medication changes. Did not have medicine today. No fever, chills, urinary symptoms, vaginal bleeding, discharge. Frequently requires admission for these symptoms.   Past Medical History:  Diagnosis Date   Acute H. pylori gastric ulcer    Diabetes mellitus ([place])    Diabetic gastroparesis ([place])    Gastroparesis    GERD (gastroesophageal reflux disease)    Hypertension    Hyperthyroidism     Home Medications Prior to Admission medications   Medication Sig Start Date End Date Taking? Authorizing Provider  albuterol (VENTOLIN HFA) 108 (90 Base) MCG/ACT inhaler Inhale 2 puffs into the lungs every 6 (six) hours as needed for shortness of breath.   Yes [provider]  AMBULATORY NON FORMULARY MEDICATION Medication Name: Domperidone 10 mg: Take one tablet by mouth three times a day 07/09/21  Yes Armbruster, Carlota Raspberry, MD  atorvastatin (LIPITOR) 20 MG tablet Take 1 tablet (20 mg total) by mouth daily. 04/10/21  Yes [name], MD  blood glucose meter kit and supplies Dispense based on patient and insurance preference. Use up to four times daily as directed. (FOR ICD-10 E10.9, E11.9). 06/14/21  Yes Shelly Coss, MD  carvedilol (COREG) 6.25 MG tablet Take 1 tablet (6.25 mg total) by mouth 2 (two) times daily with a meal. 07/01/21  Yes Nita Sells, MD  Continuous Blood Gluc Sensor (DEXCOM G6 SENSOR) MISC 1 each by Does not apply route daily.  11/21/20  Yes [name], MD  Continuous Blood Gluc Transmit (DEXCOM G6 TRANSMITTER) MISC 1 each by Does not apply route daily. 09/24/20  Yes [name], MD  dicyclomine (BENTYL) 20 MG tablet Take 1 tablet (20 mg total) by mouth 3 (three) times daily as needed for spasms. 04/24/21  Yes Aline August, MD  glucose blood (TRUE METRIX BLOOD GLUCOSE TEST) test strip Use as instructed Patient taking differently: 1 each by Other route See admin instructions. Use as instructed 09/03/20  Yes Rudd, Lillette Boxer, MD  hydrOXYzine (VISTARIL) 25 MG capsule Take 1 capsule (25 mg total) by mouth every 8 (eight) hours as needed. Patient taking differently: Take 25 mg by mouth every 8 (eight) hours as needed for anxiety. 07/09/21  Yes [name], MD  insulin detemir (LEVEMIR FLEXTOUCH) 100 UNIT/ML FlexPen Inject 16 Units into the skin daily. 06/14/21  Yes Shelly Coss, MD  Insulin Pen Needle (PEN NEEDLES 3/16") 31G X 5 MM MISC 1 each by Does not apply route in the morning, at noon, and at bedtime. 06/14/21  Yes Shelly Coss, MD  insulin regular (NOVOLIN R) 100 units/mL injection Inject 0-0.15 mLs (0-15 Units total) into the skin See admin instructions. Via pump.Start this medication after you follow up with your PCP Patient taking differently: Inject 0-15 Units into the skin See admin instructions. Via pump. 06/14/21  Yes Shelly Coss, MD  metoCLOPramide (REGLAN) 10 MG tablet Take 1 tablet (10 mg total) by mouth every  8 (eight) hours as needed for nausea. 06/14/21 07/20/21 Yes Shelly Coss, MD  pantoprazole (PROTONIX) 40 MG tablet Take 1 tablet (40 mg total) by mouth 2 (two) times daily before a meal. 04/24/21  Yes Aline August, MD  promethazine (PHENERGAN) 12.5 MG tablet Take 1 tablet (12.5 mg total) by mouth every 6 (six) hours as needed for nausea or vomiting. 07/01/21  Yes Nita Sells, MD  TRUEPLUS LANCETS 28G MISC assist with checking blood sugar TID and qhs Patient taking differently: 1 each  by Other route See admin instructions. assist with checking blood sugar TID and qhs 09/10/15  Yes Noel, Tiffany S, PA-C  insulin aspart (NOVOLOG) 100 UNIT/ML FlexPen Inject 5 Units into the skin 3 (three) times daily with meals. 02/23/18 07/05/19  Donne Hazel, MD      Allergies    Patient has no known allergies.    Review of Systems   Review of Systems  Physical Exam Updated Vital Signs BP 125/81 (BP Location: Right Arm)   Pulse 100   Temp 98.9 F (37.2 C) (Oral)   Resp 18   Ht _0  (1.727 m)   Wt 85.4 kg   SpO2 100%   BMI 28.63 kg/m  Physical Exam Vitals and nursing note reviewed.  Constitutional:      General: She is not in acute distress.    Appearance: She is well-developed. She is ill-appearing. She is not diaphoretic.  HENT:     Head: Normocephalic and atraumatic.  Eyes:     Conjunctiva/sclera: Conjunctivae normal.  Cardiovascular:     Rate and Rhythm: Normal rate and regular rhythm.     Heart sounds: Normal heart sounds. No murmur heard.   No friction rub. No gallop.  Pulmonary:     Effort: Pulmonary effort is normal. No respiratory distress.     Breath sounds: Normal breath sounds. No wheezing or rales.  Abdominal:     General: There is no distension.     Palpations: Abdomen is soft.     Tenderness: There is abdominal tenderness in the left upper quadrant. There is no guarding.  Musculoskeletal:        General: No tenderness.     Cervical back: Normal range of motion.  Skin:    General: Skin is warm and dry.     Findings: No erythema or rash.  Neurological:     Mental Status: She is alert and oriented to person, place, and time.    ED Results / Procedures / Treatments   Labs (all labs ordered are listed, but only abnormal results are displayed) Labs Reviewed  COMPREHENSIVE METABOLIC PANEL - Abnormal; Notable for the following components:      Result Value   Chloride 97 (*)    Glucose, Bld 398 (*)    BUN 24 (*)    Creatinine, Ser 1.58 (*)    Total  Protein 8.4 (*)    Alkaline Phosphatase 129 (*)    GFR, Estimated 45 (*)    All other components within normal limits  CBC - Abnormal; Notable for the following components:   WBC 13.6 (*)    Platelets 465 (*)    All other components within normal limits  URINALYSIS, ROUTINE W REFLEX MICROSCOPIC - Abnormal; Notable for the following components:   Color, Urine STRAW (*)    Glucose, UA >=500 (*)    Protein, ur 100 (*)    Bacteria, UA RARE (*)    All other components within normal limits  GLUCOSE, CAPILLARY - Abnormal; Notable for the following components:   Glucose-Capillary 237 (*)    All other components within normal limits  BASIC METABOLIC PANEL - Abnormal; Notable for the following components:   Glucose, Bld 255 (*)    All other components within normal limits  GLUCOSE, CAPILLARY - Abnormal; Notable for the following components:   Glucose-Capillary 261 (*)    All other components within normal limits  GLUCOSE, CAPILLARY - Abnormal; Notable for the following components:   Glucose-Capillary 106 (*)    All other components within normal limits  GLUCOSE, CAPILLARY - Abnormal; Notable for the following components:   Glucose-Capillary 348 (*)    All other components within normal limits  GLUCOSE, CAPILLARY - Abnormal; Notable for the following components:   Glucose-Capillary 264 (*)    All other components within normal limits  GLUCOSE, CAPILLARY - Abnormal; Notable for the following components:   Glucose-Capillary 200 (*)    All other components within normal limits  GLUCOSE, CAPILLARY - Abnormal; Notable for the following components:   Glucose-Capillary 282 (*)    All other components within normal limits  GLUCOSE, CAPILLARY - Abnormal; Notable for the following components:   Glucose-Capillary 181 (*)    All other components within normal limits  GLUCOSE, CAPILLARY - Abnormal; Notable for the following components:   Glucose-Capillary 269 (*)    All other components within  normal limits  CBG MONITORING, ED - Abnormal; Notable for the following components:   Glucose-Capillary 284 (*)    All other components within normal limits  CBG MONITORING, ED - Abnormal; Notable for the following components:   Glucose-Capillary 296 (*)    All other components within normal limits  LIPASE, BLOOD  I-STAT BETA HCG BLOOD, ED ([facility], [facility], [facility] ONLY)    EKG EKG Interpretation  Date/Time:  Saturday July 20 2021 21:57:16 EDT Ventricular Rate:  94 PR Interval:  147 QRS Duration: 84 QT Interval:  311 QTC Calculation: 389 R Axis:   63 Text Interpretation: Sinus rhythm Confirmed by Madalyn Rob [phone]) on 07/21/2021 4:24:25 PM  Radiology No results found.  Procedures Procedures    Medications Ordered in ED Medications  ondansetron (ZOFRAN) injection 4 mg (4 mg Intravenous Given 07/20/21 1530)  lactated ringers bolus 1,000 mL (0 mLs Intravenous Stopped 07/20/21 2235)  haloperidol lactate (HALDOL) injection 4 mg (4 mg Intravenous Given 07/20/21 1628)  HYDROmorphone (DILAUDID) injection 0.25 mg (0.25 mg Intravenous Given 07/20/21 1626)  HYDROmorphone (DILAUDID) injection 1 mg (1 mg Intravenous Given 07/20/21 1939)  pantoprazole (PROTONIX) injection 40 mg (40 mg Intravenous Given 07/20/21 1936)  metoCLOPramide (REGLAN) injection 10 mg (10 mg Intravenous Given 07/20/21 1941)  diphenhydrAMINE (BENADRYL) injection 25 mg (25 mg Intravenous Given 07/20/21 1934)  diphenhydrAMINE (BENADRYL) capsule 50 mg (50 mg Oral Given 07/21/21 2224)    ED Course/ Medical Decision Making/ A&P                           Medical Decision Making Amount and/or Complexity of Data Reviewed Labs: ordered.  Risk Prescription drug management. Decision regarding hospitalization.   31yo female with history of DM, pepetic ulcer, gastroparesis, htn, hlpd, presents with concern for nausea, vomiting and abdominal pain.   DDx includes appendicitis, pancreatitis, cholecystitis, pyelonephritis, nephrolithiasis,  diverticulitis, DKA, gastroparesis, SBO, PID, ovarian torsion, ectopic pregnancy, and tuboovarian abscess  Has had history of similar discomfort in the past with gastroparesis.  Low suspicion for acute surgical abdomen.  Labs do not show DKA, do not suspect HHS. Pregnancy test negative.  Suspect hyperglycemia and gastroparesis. Given haldol, pain medications,reglan, with continued emesis. Will admit for continued symptomatic control.        Final Clinical Impression(s) / ED Diagnoses Final diagnoses:  Nausea and vomiting, unspecified vomiting type  Gastroparesis  Dehydration    Rx / DC Orders ED Discharge Orders          Ordered    Increase activity slowly        07/23/21 0741    Diet - low sodium heart healthy        07/23/21 0741              Gareth Morgan, MD 07/24/21 [phone]

## 2021-07-26 ENCOUNTER — Telehealth: Payer: Self-pay

## 2021-07-26 NOTE — Telephone Encounter (Signed)
Received a fax from [facility] stating that they have received the referral, but "The patient does not currently meet criteria for these services". Looks like referral was sent for gastric pacemaker evaluation or pyloromyotomy. [facility] is not able to accept this patient either.  Please advise, thanks.

## 2021-07-28 ENCOUNTER — Encounter: Payer: Self-pay | Admitting: Family Medicine

## 2021-07-28 DIAGNOSIS — L239 Allergic contact dermatitis, unspecified cause: Secondary | ICD-10-CM

## 2021-07-29 ENCOUNTER — Other Ambulatory Visit: Payer: Self-pay

## 2021-07-29 ENCOUNTER — Inpatient Hospital Stay (HOSPITAL_COMMUNITY)
Admission: EM | Admit: 2021-07-29 | Discharge: 2021-07-31 | DRG: 074 | Disposition: A | Discharge status: Home / Self Care | Payer: 59 | Attending: Internal Medicine | Admitting: Internal Medicine

## 2021-07-29 ENCOUNTER — Encounter (HOSPITAL_COMMUNITY): Payer: Self-pay

## 2021-07-29 DIAGNOSIS — R112 Nausea with vomiting, unspecified: Secondary | ICD-10-CM | POA: Diagnosis not present

## 2021-07-29 DIAGNOSIS — Z833 Family history of diabetes mellitus: Secondary | ICD-10-CM

## 2021-07-29 DIAGNOSIS — E1159 Type 2 diabetes mellitus with other circulatory complications: Secondary | ICD-10-CM | POA: Diagnosis present

## 2021-07-29 DIAGNOSIS — E1043 Type 1 diabetes mellitus with diabetic autonomic (poly)neuropathy: Principal | ICD-10-CM

## 2021-07-29 DIAGNOSIS — K3184 Gastroparesis: Secondary | ICD-10-CM

## 2021-07-29 DIAGNOSIS — Z9641 Presence of insulin pump (external) (internal): Secondary | ICD-10-CM | POA: Diagnosis present

## 2021-07-29 DIAGNOSIS — Z89422 Acquired absence of other left toe(s): Secondary | ICD-10-CM

## 2021-07-29 DIAGNOSIS — E1065 Type 1 diabetes mellitus with hyperglycemia: Secondary | ICD-10-CM | POA: Diagnosis present

## 2021-07-29 DIAGNOSIS — E1042 Type 1 diabetes mellitus with diabetic polyneuropathy: Secondary | ICD-10-CM | POA: Diagnosis present

## 2021-07-29 DIAGNOSIS — E1069 Type 1 diabetes mellitus with other specified complication: Secondary | ICD-10-CM | POA: Diagnosis present

## 2021-07-29 DIAGNOSIS — K219 Gastro-esophageal reflux disease without esophagitis: Secondary | ICD-10-CM | POA: Diagnosis present

## 2021-07-29 DIAGNOSIS — E785 Hyperlipidemia, unspecified: Secondary | ICD-10-CM | POA: Diagnosis present

## 2021-07-29 DIAGNOSIS — L239 Allergic contact dermatitis, unspecified cause: Secondary | ICD-10-CM

## 2021-07-29 DIAGNOSIS — E0590 Thyrotoxicosis, unspecified without thyrotoxic crisis or storm: Secondary | ICD-10-CM | POA: Diagnosis present

## 2021-07-29 DIAGNOSIS — Z8711 Personal history of peptic ulcer disease: Secondary | ICD-10-CM

## 2021-07-29 DIAGNOSIS — I152 Hypertension secondary to endocrine disorders: Secondary | ICD-10-CM | POA: Diagnosis present

## 2021-07-29 DIAGNOSIS — Z794 Long term (current) use of insulin: Secondary | ICD-10-CM

## 2021-07-29 DIAGNOSIS — Z79899 Other long term (current) drug therapy: Secondary | ICD-10-CM

## 2021-07-29 LAB — COMPREHENSIVE METABOLIC PANEL
ALT: 22 U/L (ref 0–44)
AST: 19 U/L (ref 15–41)
Albumin: 3.4 g/dL — ABNORMAL LOW (ref 3.5–5.0)
Alkaline Phosphatase: 99 U/L (ref 38–126)
Anion gap: 12 (ref 5–15)
BUN: 22 mg/dL — ABNORMAL HIGH (ref 6–20)
CO2: 23 mmol/L (ref 22–32)
Calcium: 9.9 mg/dL (ref 8.9–10.3)
Chloride: 100 mmol/L (ref 98–111)
Creatinine, Ser: 1.04 mg/dL — ABNORMAL HIGH (ref 0.44–1.00)
GFR, Estimated: >60 mL/min (ref >60)
Glucose, Bld: 369 mg/dL — ABNORMAL HIGH (ref 70–99)
Potassium: 4 mmol/L (ref 3.5–5.1)
Sodium: 135 mmol/L (ref 135–145)
Total Bilirubin: 0.7 mg/dL (ref 0.3–1.2)
Total Protein: 7.8 g/dL (ref 6.5–8.1)

## 2021-07-29 LAB — CBC WITH DIFFERENTIAL/PLATELET
Abs Immature Granulocytes: 0.06 10*3/uL (ref 0.00–0.07)
Basophils Absolute: 0 10*3/uL (ref 0.0–0.1)
Basophils Relative: 0 %
Eosinophils Absolute: 0.1 10*3/uL (ref 0.0–0.5)
Eosinophils Relative: 1 %
HCT: 36.3 % (ref 36.0–46.0)
Hemoglobin: 12.2 g/dL (ref 12.0–15.0)
Immature Granulocytes: 0 %
Lymphocytes Relative: 16 %
Lymphs Abs: 2.2 10*3/uL (ref 0.7–4.0)
MCH: 29.5 pg (ref 26.0–34.0)
MCHC: 33.6 g/dL (ref 30.0–36.0)
MCV: 87.7 fL (ref 80.0–100.0)
Monocytes Absolute: 0.6 10*3/uL (ref 0.1–1.0)
Monocytes Relative: 4 %
Neutro Abs: 10.7 10*3/uL — ABNORMAL HIGH (ref 1.7–7.7)
Neutrophils Relative %: 79 %
Platelets: 398 10*3/uL (ref 150–400)
RBC: 4.14 MIL/uL (ref 3.87–5.11)
RDW: 13.2 % (ref 11.5–15.5)
WBC: 13.6 10*3/uL — ABNORMAL HIGH (ref 4.0–10.5)
nRBC: 0 % (ref 0.0–0.2)

## 2021-07-29 LAB — URINALYSIS, ROUTINE W REFLEX MICROSCOPIC
Bacteria, UA: NONE SEEN
Bilirubin Urine: NEGATIVE
Glucose, UA: >=500 mg/dL — AB
Ketones, ur: 20 mg/dL — AB
Leukocytes,Ua: NEGATIVE
Nitrite: NEGATIVE
Protein, ur: >=300 mg/dL — AB
Specific Gravity, Urine: 1.016 (ref 1.005–1.030)
pH: 7 (ref 5.0–8.0)

## 2021-07-29 LAB — GLUCOSE, CAPILLARY
Glucose-Capillary: 245 mg/dL — ABNORMAL HIGH (ref 70–99)
Glucose-Capillary: 174 mg/dL — ABNORMAL HIGH (ref 70–99)
Glucose-Capillary: 128 mg/dL — ABNORMAL HIGH (ref 70–99)

## 2021-07-29 LAB — RENAL FUNCTION PANEL
Albumin: 3.1 g/dL — ABNORMAL LOW (ref 3.5–5.0)
Albumin: 2.8 g/dL — ABNORMAL LOW (ref 3.5–5.0)
Anion gap: 12 (ref 5–15)
Anion gap: 8 (ref 5–15)
BUN: 21 mg/dL — ABNORMAL HIGH (ref 6–20)
BUN: 24 mg/dL — ABNORMAL HIGH (ref 6–20)
CO2: 23 mmol/L (ref 22–32)
CO2: 27 mmol/L (ref 22–32)
Calcium: 9.4 mg/dL (ref 8.9–10.3)
Calcium: 9.2 mg/dL (ref 8.9–10.3)
Chloride: 100 mmol/L (ref 98–111)
Chloride: 105 mmol/L (ref 98–111)
Creatinine, Ser: 0.93 mg/dL (ref 0.44–1.00)
Creatinine, Ser: 1.12 mg/dL — ABNORMAL HIGH (ref 0.44–1.00)
GFR, Estimated: >60 mL/min (ref >60)
GFR, Estimated: >60 mL/min (ref >60)
Glucose, Bld: 399 mg/dL — ABNORMAL HIGH (ref 70–99)
Glucose, Bld: 132 mg/dL — ABNORMAL HIGH (ref 70–99)
Phosphorus: 4.6 mg/dL (ref 2.5–4.6)
Phosphorus: 4.4 mg/dL (ref 2.5–4.6)
Potassium: 5.6 mmol/L — ABNORMAL HIGH (ref 3.5–5.1)
Potassium: 4.4 mmol/L (ref 3.5–5.1)
Sodium: 135 mmol/L (ref 135–145)
Sodium: 140 mmol/L (ref 135–145)

## 2021-07-29 LAB — MRSA NEXT GEN BY PCR, NASAL: MRSA by PCR Next Gen: NOT DETECTED

## 2021-07-29 LAB — LIPASE, BLOOD: Lipase: 30 U/L (ref 11–51)

## 2021-07-29 LAB — POTASSIUM: Potassium: 6.1 mmol/L — ABNORMAL HIGH (ref 3.5–5.1)

## 2021-07-29 LAB — CBG MONITORING, ED
Glucose-Capillary: 402 mg/dL — ABNORMAL HIGH (ref 70–99)
Glucose-Capillary: 427 mg/dL — ABNORMAL HIGH (ref 70–99)

## 2021-07-29 LAB — I-STAT BETA HCG BLOOD, ED (MC, WL, AP ONLY): I-stat hCG, quantitative: <5 m[IU]/mL (ref <5)

## 2021-07-29 MED ORDER — CHLORHEXIDINE GLUCONATE CLOTH 2 % EX PADS
6.0000 | MEDICATED_PAD | Freq: Every day | CUTANEOUS | Status: DC
  Administered 2021-07-29 – 2021-07-30 (×2): 6 via TOPICAL

## 2021-07-29 MED ORDER — ONDANSETRON HCL 4 MG/2ML IJ SOLN
4.0000 mg | Freq: Once | INTRAMUSCULAR | Status: AC
  Administered 2021-07-29: 4 mg via INTRAVENOUS
  Filled 2021-07-29: qty 2

## 2021-07-29 MED ORDER — PROCHLORPERAZINE EDISYLATE 10 MG/2ML IJ SOLN
10.0000 mg | Freq: Four times a day (QID) | INTRAMUSCULAR | Status: DC
  Administered 2021-07-29 – 2021-07-31 (×9): 10 mg via INTRAVENOUS
  Filled 2021-07-29 (×9): qty 2

## 2021-07-29 MED ORDER — PANTOPRAZOLE SODIUM 40 MG IV SOLR
40.0000 mg | INTRAVENOUS | Status: DC
  Administered 2021-07-29 – 2021-07-30 (×2): 40 mg via INTRAVENOUS
  Filled 2021-07-29 (×2): qty 10

## 2021-07-29 MED ORDER — INSULIN ASPART 100 UNIT/ML IJ SOLN
0.0000 [IU] | Freq: Three times a day (TID) | INTRAMUSCULAR | Status: DC
  Administered 2021-07-29: 5 [IU] via SUBCUTANEOUS
  Administered 2021-07-29: 15 [IU] via SUBCUTANEOUS
  Administered 2021-07-29 – 2021-07-30 (×3): 3 [IU] via SUBCUTANEOUS
  Administered 2021-07-30: 5 [IU] via SUBCUTANEOUS
  Administered 2021-07-31: 8 [IU] via SUBCUTANEOUS
  Administered 2021-07-31: 3 [IU] via SUBCUTANEOUS
  Filled 2021-07-29: qty 0.15

## 2021-07-29 MED ORDER — LORAZEPAM 2 MG/ML IJ SOLN
1.0000 mg | Freq: Four times a day (QID) | INTRAMUSCULAR | Status: DC | PRN
Start: 2021-07-29 — End: 2021-07-31
  Filled 2021-07-29: qty 1

## 2021-07-29 MED ORDER — SODIUM CHLORIDE 0.9 % IV SOLN: INTRAVENOUS | Status: DC

## 2021-07-29 MED ORDER — NICARDIPINE HCL IN NACL 20-0.86 MG/200ML-% IV SOLN
3.0000 mg/h | INTRAVENOUS | Status: DC
  Filled 2021-07-29: qty 200

## 2021-07-29 MED ORDER — INSULIN DETEMIR 100 UNIT/ML ~~LOC~~ SOLN
16.0000 [IU] | Freq: Every day | SUBCUTANEOUS | Status: DC
  Administered 2021-07-29 – 2021-07-31 (×3): 16 [IU] via SUBCUTANEOUS
  Filled 2021-07-29 (×3): qty 0.16

## 2021-07-29 MED ORDER — METOCLOPRAMIDE HCL 5 MG/ML IJ SOLN
10.0000 mg | Freq: Once | INTRAMUSCULAR | Status: AC
  Administered 2021-07-29: 10 mg via INTRAVENOUS
  Filled 2021-07-29: qty 2

## 2021-07-29 MED ORDER — MORPHINE SULFATE (PF) 2 MG/ML IV SOLN
2.0000 mg | INTRAVENOUS | Status: DC | PRN
  Administered 2021-07-29: 2 mg via INTRAVENOUS
  Filled 2021-07-29: qty 1

## 2021-07-29 MED ORDER — HYDROMORPHONE HCL 1 MG/ML IJ SOLN
0.5000 mg | INTRAMUSCULAR | Status: DC | PRN
  Administered 2021-07-29 – 2021-07-31 (×7): 0.5 mg via INTRAVENOUS
  Filled 2021-07-29 (×7): qty 1

## 2021-07-29 MED ORDER — DICYCLOMINE HCL 20 MG PO TABS: 20.0000 mg | ORAL_TABLET | Freq: Three times a day (TID) | ORAL | Status: DC | PRN

## 2021-07-29 MED ORDER — INSULIN ASPART 100 UNIT/ML IJ SOLN
0.0000 [IU] | Freq: Every day | INTRAMUSCULAR | Status: DC
  Administered 2021-07-30: 2 [IU] via SUBCUTANEOUS
  Filled 2021-07-29: qty 0.05

## 2021-07-29 MED ORDER — ENOXAPARIN SODIUM 40 MG/0.4ML IJ SOSY
40.0000 mg | PREFILLED_SYRINGE | INTRAMUSCULAR | Status: DC
  Administered 2021-07-29 – 2021-07-31 (×3): 40 mg via SUBCUTANEOUS
  Filled 2021-07-29 (×3): qty 0.4

## 2021-07-29 MED ORDER — SODIUM CHLORIDE 0.9 % IV BOLUS
1000.0000 mL | Freq: Once | INTRAVENOUS | Status: AC
  Administered 2021-07-29: 1000 mL via INTRAVENOUS

## 2021-07-29 MED ORDER — HYDROMORPHONE HCL 1 MG/ML IJ SOLN
0.5000 mg | INTRAMUSCULAR | Status: DC | PRN
  Administered 2021-07-29 (×2): 0.5 mg via INTRAVENOUS
  Filled 2021-07-29 (×2): qty 1

## 2021-07-29 MED ORDER — METOPROLOL TARTRATE 5 MG/5ML IV SOLN: 5.0000 mg | Freq: Four times a day (QID) | INTRAVENOUS | Status: DC

## 2021-07-29 MED ORDER — HYDROMORPHONE HCL 1 MG/ML IJ SOLN
0.5000 mg | Freq: Once | INTRAMUSCULAR | Status: AC
  Administered 2021-07-29: 0.5 mg via INTRAVENOUS
  Filled 2021-07-29: qty 1

## 2021-07-29 MED ORDER — METOPROLOL TARTRATE 5 MG/5ML IV SOLN
5.0000 mg | Freq: Four times a day (QID) | INTRAVENOUS | Status: DC | PRN
  Administered 2021-07-29: 5 mg via INTRAVENOUS
  Filled 2021-07-29: qty 5

## 2021-07-29 MED ORDER — LACTATED RINGERS IV SOLN
INTRAVENOUS | Status: DC
Start: 2021-07-29 — End: 2021-07-31

## 2021-07-29 MED ORDER — METOPROLOL TARTRATE 5 MG/5ML IV SOLN: 5.0000 mg | Freq: Four times a day (QID) | INTRAVENOUS | Status: DC | PRN

## 2021-07-29 MED ORDER — HYDROMORPHONE HCL 1 MG/ML IJ SOLN
1.0000 mg | Freq: Once | INTRAMUSCULAR | Status: AC
  Administered 2021-07-29: 1 mg via INTRAVENOUS
  Filled 2021-07-29: qty 1

## 2021-07-29 MED ORDER — ONDANSETRON HCL 4 MG/2ML IJ SOLN: 4.0000 mg | Freq: Four times a day (QID) | INTRAMUSCULAR | Status: DC | PRN

## 2021-07-29 MED ORDER — DIPHENHYDRAMINE HCL 50 MG/ML IJ SOLN
25.0000 mg | Freq: Once | INTRAMUSCULAR | Status: AC
  Administered 2021-07-29: 25 mg via INTRAVENOUS
  Filled 2021-07-29: qty 1

## 2021-07-29 MED ORDER — SODIUM ZIRCONIUM CYCLOSILICATE 5 G PO PACK
5.0000 g | PACK | Freq: Three times a day (TID) | ORAL | Status: DC
  Administered 2021-07-29: 5 g via ORAL
  Filled 2021-07-29: qty 1

## 2021-07-29 MED ORDER — HALOPERIDOL LACTATE 5 MG/ML IJ SOLN
2.0000 mg | Freq: Once | INTRAMUSCULAR | Status: AC
Start: 2021-07-29 — End: 2021-07-29
  Administered 2021-07-29: 2 mg via INTRAVENOUS
  Filled 2021-07-29: qty 1

## 2021-07-29 NOTE — ED Triage Notes (Signed)
Pt presents to ED from home with c/o abdominal pain with N/V that began this morning. Pt has hx of gastroparesis states symptoms are the same as prior episodes. Pt hypertensive upon triage.

## 2021-07-29 NOTE — Progress Notes (Signed)
Inpatient Diabetes Program Recommendations  AACE/ADA: New Consensus Statement on Inpatient Glycemic Control (2015)  Target Ranges:  Prepandial:   less than 140 mg/dL      Peak postprandial:   less than 180 mg/dL (1-2 hours)      Critically ill patients:  140 - 180 mg/dL   Lab Results  Component Value Date   GLUCAP 427 (H) 07/29/2021   HGBA1C 9.7 (H) 05/28/2021    Review of Glycemic Control  Diabetes history: DM1 Outpatient Diabetes medications: insulin pump Current orders for Inpatient glycemic control: Levemir 16 units QD, Novolog 0-15 units TID with meals and 0-5 HS  HgbA1C - 9.7%  Inpatient Diabetes Program Recommendations:    Very familiar with pt from previous admission. Secondary school teacher for Dexcom was not ready at pharmacy and she has not worn pump since Saturday (48 hours) States she took Levemir and Novolog when she took pump off. When asked about glucose monitoring, pt states she has meter but did not have money to get strips. Did not want to ask Mother or boyfriend for money since they pay for so much.  Has Endo appt in July.  Recommended pt contact Endo to see if appt could be moved up to sooner date.  Pt states transmitter for Dexcom should be ready at [facility] any time now. Discussed having a back-up plan for monitoring blood sugars and keeping supply of Levemir and Novolog on hand. Pt voices understanding.   Insulin orders started this am.  Will follow closely.  Thank you. Lorenda Peck, RD, LDN, CDE Inpatient Diabetes Coordinator [phone]

## 2021-07-29 NOTE — Telephone Encounter (Signed)
This is a very difficult situation.  I refer the patient to [facility], [place], [place], none of them will accept this patient for tertiary care visit.  Can you clarify with the patient if she has been able to get the domperidone?  If so she needs an EKG for monitoring while on this.  Not sure if she is tried it yet or if it is helping?  Thanks

## 2021-07-29 NOTE — Telephone Encounter (Signed)
Noted.   Reminder in epic to check patient's chart on Wednesday morning as I will be out of the office.

## 2021-07-29 NOTE — H&P (Addendum)
History and Physical    Patient: Kelli Hudson IDP:[id number] DOB: May 12, 1990 DOA: 07/29/2021 DOS: the patient was seen and examined on 07/29/2021 PCP: [name], MD  Patient coming from: Home  Chief Complaint:  Chief Complaint  Patient presents with   Gastroparesis   HPI: Kelli Hudson is a 31 y.o. female with medical history significant of DMt1, DM gastroparesis, HTN. Presenting with N/V. Symptoms started yesterday morning. She tried her normal medication, but they didn't work. She has not had any sick contacts. She has not had any diarrhea or fever. When her symptoms did not improve, she decided to come to the ED for assistance. She denies any other aggravating of alleviating factors.     Review of Systems: As mentioned in the history of present illness. All other systems reviewed and are negative. Past Medical History:  Diagnosis Date   Acute H. pylori gastric ulcer    Diabetes mellitus ([place])    Diabetic gastroparesis ([place])    Gastroparesis    GERD (gastroesophageal reflux disease)    Hypertension    Hyperthyroidism    Past Surgical History:  Procedure Laterality Date   AMPUTATION TOE Left 03/10/2018   Procedure: AMPUTATION FIFTH TOE;  Surgeon: Trula Slade, DPM;  Location: [facility];  Service: Podiatry;  Laterality: Left;   BIOPSY  01/28/2020   Procedure: BIOPSY;  Surgeon: Otis Brace, MD;  Location: [facility];  Service: Gastroenterology;;   ESOPHAGOGASTRODUODENOSCOPY N/A 01/28/2020   Procedure: ESOPHAGOGASTRODUODENOSCOPY (EGD);  Surgeon: Otis Brace, MD;  Location: [facility];  Service: Gastroenterology;  Laterality: N/A;   ESOPHAGOGASTRODUODENOSCOPY (EGD) WITH PROPOFOL Left 09/08/2015   Procedure: ESOPHAGOGASTRODUODENOSCOPY (EGD) WITH PROPOFOL;  Surgeon: Arta Silence, MD;  Location: [facility] ENDOSCOPY;  Service: Endoscopy;  Laterality: Left;   WISDOM TOOTH EXTRACTION     Social History:  reports that she has never  smoked. She has never used smokeless tobacco. She reports current alcohol use of about 1.0 standard drink of alcohol per week. She reports that she does not use drugs.  No Known Allergies  Family History  Problem Relation Age of Onset   Lung cancer Mother    Diabetes Mother    Bipolar disorder Father    Liver cancer Maternal Grandfather    Diabetes Maternal Aunt        x 2   Pancreatic cancer Maternal Uncle    Prostate cancer Maternal Uncle    Breast cancer Other        maternal great aunt   Heart disease Other    Ovarian cancer Other        maternal great aunt   Kidney disease Other        maternal great aunt   Colon cancer Neg Hx    Stomach cancer Neg Hx     Prior to Admission medications   Medication Sig Start Date End Date Taking? Authorizing Provider  albuterol (VENTOLIN HFA) 108 (90 Base) MCG/ACT inhaler Inhale 2 puffs into the lungs every 6 (six) hours as needed for shortness of breath.    [provider]  AMBULATORY NON FORMULARY MEDICATION Medication Name: Domperidone 10 mg: Take one tablet by mouth three times a day 07/09/21   Armbruster, Carlota Raspberry, MD  atorvastatin (LIPITOR) 20 MG tablet Take 1 tablet (20 mg total) by mouth daily. 04/10/21   [name], MD  blood glucose meter kit and supplies Dispense based on patient and insurance preference. Use up to four times daily as directed. (FOR ICD-10 E10.9,  E11.9). 06/14/21   Shelly Coss, MD  carvedilol (COREG) 6.25 MG tablet Take 1 tablet (6.25 mg total) by mouth 2 (two) times daily with a meal. 07/01/21   Nita Sells, MD  Continuous Blood Gluc Sensor (DEXCOM G6 SENSOR) MISC 1 each by Does not apply route daily. 11/21/20   [name], MD  Continuous Blood Gluc Transmit (DEXCOM G6 TRANSMITTER) MISC 1 each by Does not apply route daily. 09/24/20   [name], MD  dicyclomine (BENTYL) 20 MG tablet Take 1 tablet (20 mg total) by mouth 3 (three) times daily as needed for spasms. 04/24/21   Aline August, MD  glucose blood (TRUE METRIX BLOOD GLUCOSE TEST) test strip Use as instructed Patient taking differently: 1 each by Other route See admin instructions. Use as instructed 09/03/20   [name], MD  hydrOXYzine (VISTARIL) 25 MG capsule Take 1 capsule (25 mg total) by mouth every 8 (eight) hours as needed. Patient taking differently: Take 25 mg by mouth every 8 (eight) hours as needed for anxiety. 07/09/21   [name], MD  insulin detemir (LEVEMIR FLEXTOUCH) 100 UNIT/ML FlexPen Inject 16 Units into the skin daily. 06/14/21   Shelly Coss, MD  Insulin Pen Needle (PEN NEEDLES 3/16") 31G X 5 MM MISC 1 each by Does not apply route in the morning, at noon, and at bedtime. 06/14/21   Shelly Coss, MD  insulin regular (NOVOLIN R) 100 units/mL injection Inject 0-0.15 mLs (0-15 Units total) into the skin See admin instructions. Via pump.Start this medication after you follow up with your PCP Patient taking differently: Inject 0-15 Units into the skin See admin instructions. Via pump. 06/14/21   Shelly Coss, MD  metoCLOPramide (REGLAN) 10 MG tablet Take 1 tablet (10 mg total) by mouth every 8 (eight) hours as needed for nausea. 06/14/21 09/21/21  Shelly Coss, MD  pantoprazole (PROTONIX) 40 MG tablet Take 1 tablet (40 mg total) by mouth 2 (two) times daily before a meal. 04/24/21   Aline August, MD  promethazine (PHENERGAN) 12.5 MG tablet Take 1 tablet (12.5 mg total) by mouth every 6 (six) hours as needed for nausea or vomiting. 07/01/21   Nita Sells, MD  TRUEPLUS LANCETS 28G MISC assist with checking blood sugar TID and qhs Patient taking differently: 1 each by Other route See admin instructions. assist with checking blood sugar TID and qhs 09/10/15   Zettie Pho S, PA-C  insulin aspart (NOVOLOG) 100 UNIT/ML FlexPen Inject 5 Units into the skin 3 (three) times daily with meals. 02/23/18 07/05/19  Donne Hazel, MD    Physical Exam: Vitals:   07/29/21 0430 07/29/21 0600  07/29/21 0630 07/29/21 0700  BP: (!) 198/125 (!) 171/120 (!) 187/131 (!) 192/126  Pulse: (!) 110 (!) 111 (!) 112 (!) 113  Resp: 13 12 13 12   Temp:      TempSrc:      SpO2: 99% 98% 100% 100%  Weight:      Height:       General: 31 y.o. female resting in bed in NAD Eyes: PERRL, normal sclera ENMT: Nares patent w/o discharge, orophaynx clear, dentition normal, ears w/o discharge/lesions/ulcers Neck: Supple, trachea midline Cardiovascular: tachy, +S1, S2, no m/g/r, equal pulses throughout Respiratory: CTABL, no w/r/r, normal WOB GI: BS+, ND, mild global TTP, no masses noted, no organomegaly noted MSK: No e/c/c Neuro: A&O x 3, no focal deficits Psyc: Appropriate interaction and affect, calm/cooperative  Data Reviewed:  Na+  135 K+  4.0  Glucose  369 BUN  22 Scr  1.04 WBC  13.6  EKG: sinus tach, no st elevations  Assessment and Plan: Intractable N/V Gastroparesis     - place in obs, SDU     - compazine, zofran, fluids     - needs to follow up with a center that specializes in gastroparesis; looks like [facility] is working on this  HTN     - will have scheduled IV metoprolol for now     - resume home regimen when taking PO reliably     - if not responsive to metoprolol; will start cardene gtt  Uncontrolled DM type 1     - not yet in DKA, but repeating BMP to make sure she hasn't crossed over in this time     - will have her turn off her pump     - resume home levemir now and add SSI     - DM coordinator consult  HLD     - continue home regimen when taking PO reliably  GERD     - PPI  Advance Care Planning:   Code Status: FULL  Consults: None  Family Communication: None at bedside  Severity of Illness: The appropriate patient status for this patient is OBSERVATION. Observation status is judged to be reasonable and necessary in order to provide the required intensity of service to ensure the patient's safety. The patient's presenting symptoms, physical exam findings,  and initial radiographic and laboratory data in the context of their medical condition is felt to place them at decreased risk for further clinical deterioration. Furthermore, it is anticipated that the patient will be medically stable for discharge from the hospital within 2 midnights of admission.   Author: Jonnie Finner, DO 07/29/2021 7:27 AM  For on call review [email or web address].

## 2021-07-29 NOTE — ED Notes (Signed)
Attempted IV x1 with blood draw without success. Pt states she usually has to have an ultrasound IV.

## 2021-07-29 NOTE — Telephone Encounter (Signed)
Attempted to reach patient. Her vm is full at this time. Unable to leave a vm.   [name], looks like pt is currently admitted.

## 2021-07-29 NOTE — TOC Initial Note (Signed)
Transition of Care [facility]) - Initial/Assessment Note   Patient Details  Name: Tyrese Ficek MRN: [id number] Date of Birth: [birth year]  Transition of [facility]) CM/SW Contact:    Ewing Schlein, LCSW Phone Number: 07/29/2021, 11:49 AM  Clinical Narrative: [facility] consulted for medication assistance, but patient is not eligible for assistance as she has insurance. TOC signing off at this time, but can be consulted again if other needs arise.  Expected Discharge Plan: Home/Self Care  Patient Goals and CMS Choice Choice offered to / list presented to : NA  Expected Discharge Plan and Services Expected Discharge Plan: Home/Self Care Post Acute Care Choice: NA Living arrangements for the past 2 months: Apartment             DME Arranged: N/A DME Agency: NA  Prior Living Arrangements/Services Living arrangements for the past 2 months: Apartment  Activities of Daily Living Home Assistive Devices/Equipment: Insulin Pump ADL Screening (condition at time of admission) Patient's cognitive ability adequate to safely complete daily activities?: Yes Is the patient deaf or have difficulty hearing?: No Does the patient have difficulty seeing, even when wearing glasses/contacts?: No Does the patient have difficulty concentrating, remembering, or making decisions?: No Patient able to express need for assistance with ADLs?: Yes Does the patient have difficulty dressing or bathing?: No Independently performs ADLs?: Yes (appropriate for developmental age) Does the patient have difficulty walking or climbing stairs?: No Weakness of Legs: None Weakness of Arms/Hands: None  Emotional Assessment Orientation: : Oriented to Self, Oriented to Place, Oriented to  Time, Oriented to Situation  Admission diagnosis:  Gastroparesis [K31.84] Intractable nausea and vomiting [R11.2] Patient Active Problem List   Diagnosis Date Noted   Nausea and vomiting 07/20/2021   Hyperglycemia due to diabetes mellitus  (HCC) 06/11/2021   Hyperglycemia 06/10/2021   Anemia of chronic disease 06/06/2021   Intractable vomiting with nausea 06/04/2021   Dehydration 06/04/2021   Hypomagnesemia 04/21/2021   Intractable nausea and vomiting 04/20/2021   Ulcerated, foot, left, with fat layer exposed (HCC) 04/20/2021   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) 04/10/2021   DKA (diabetic ketoacidosis) (HCC) 02/24/2021   Type 1 diabetes mellitus with diabetic polyneuropathy (HCC) 12/18/2020   Uncontrolled type 1 diabetes mellitus with hyperglycemia, with long-term current use of insulin (HCC) 12/18/2020   DM (diabetes mellitus), type 1 (HCC) 12/18/2020   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 pylori infection 10/07/2019   Depression with anxiety 07/05/2019   Diabetic polyneuropathy associated with type 1 diabetes mellitus (HCC) 09/24/2017   Leukocytosis    Goiter 10/05/2009   Hypertension associated with diabetes (HCC) 10/05/2009   PCP:  Loyola Mast, MD Pharmacy:   [facility] [phone] - [place], [place] - [address] AT [facility] OF [address] [place] [address] Phone: [phone] Fax: [phone]  Readmission Risk Interventions    06/11/2021    1:01 PM 05/29/2021  9:33 AM 02/27/2021    9:17 AM  Readmission Risk Prevention Plan  Transportation Screening Complete Complete Complete   Medication Review Oceanographer) Complete  Complete  PCP or Specialist appointment within 3-5 days of discharge Complete Complete Complete  HRI or Home Care Consult Complete Complete Complete  SW Recovery Care/Counseling Consult Complete Complete Complete  Palliative Care Screening Not Applicable Not Applicable Not Applicable  Skilled Nursing Facility Not Applicable Not Applicable Not Applicable

## 2021-07-29 NOTE — ED Notes (Signed)
Patient is resting comfortably. 

## 2021-07-29 NOTE — Telephone Encounter (Signed)
Okay, that is unfortunate, she has been having a really hard time with this.  If they need our help while hospitalized they will contact us. Otherwise can you chart check and maybe give her a call later in the week if / when she is home? Thanks

## 2021-07-29 NOTE — ED Notes (Signed)
Pt ambulatory without assistance.  

## 2021-07-29 NOTE — ED Provider Notes (Signed)
[facility] DEPT Provider Note   CSN: [id number] Arrival date & time: 07/29/21  0119     History  Chief Complaint  Patient presents with   Gastroparesis    Kelli Hudson is a 31 y.o. female.  The history is provided by the patient.  Emesis Severity:  Severe Progression:  Worsening Chronicity:  Recurrent Relieved by:  Nothing Associated symptoms: abdominal pain   Associated symptoms: no fever   Patient history of diabetes, gastroparesis presents with intractable nausea vomiting and abdominal pain.  Patient reports this started several hours ago.  She reports this is similar to prior episodes.  She reports upper abdominal pain that moves into her chest.  She has had nonbloody emesis.  She reports her glucose has been elevated.  She is not currently using an insulin pump.     Home Medications Prior to Admission medications   Medication Sig Start Date End Date Taking? Authorizing Provider  albuterol (VENTOLIN HFA) 108 (90 Base) MCG/ACT inhaler Inhale 2 puffs into the lungs every 6 (six) hours as needed for shortness of breath.    [provider]  AMBULATORY NON FORMULARY MEDICATION Medication Name: Domperidone 10 mg: Take one tablet by mouth three times a day 07/09/21   Armbruster, Carlota Raspberry, MD  atorvastatin (LIPITOR) 20 MG tablet Take 1 tablet (20 mg total) by mouth daily. 04/10/21   [name], MD  blood glucose meter kit and supplies Dispense based on patient and insurance preference. Use up to four times daily as directed. (FOR ICD-10 E10.9, E11.9). 06/14/21   Shelly Coss, MD  carvedilol (COREG) 6.25 MG tablet Take 1 tablet (6.25 mg total) by mouth 2 (two) times daily with a meal. 07/01/21   Nita Sells, MD  Continuous Blood Gluc Sensor (DEXCOM G6 SENSOR) MISC 1 each by Does not apply route daily. 11/21/20   [name], MD  Continuous Blood Gluc Transmit (DEXCOM G6 TRANSMITTER) MISC 1 each by Does not apply route  daily. 09/24/20   [name], MD  dicyclomine (BENTYL) 20 MG tablet Take 1 tablet (20 mg total) by mouth 3 (three) times daily as needed for spasms. 04/24/21   Aline August, MD  glucose blood (TRUE METRIX BLOOD GLUCOSE TEST) test strip Use as instructed Patient taking differently: 1 each by Other route See admin instructions. Use as instructed 09/03/20   [name], MD  hydrOXYzine (VISTARIL) 25 MG capsule Take 1 capsule (25 mg total) by mouth every 8 (eight) hours as needed. Patient taking differently: Take 25 mg by mouth every 8 (eight) hours as needed for anxiety. 07/09/21   [name], MD  insulin detemir (LEVEMIR FLEXTOUCH) 100 UNIT/ML FlexPen Inject 16 Units into the skin daily. 06/14/21   Shelly Coss, MD  Insulin Pen Needle (PEN NEEDLES 3/16") 31G X 5 MM MISC 1 each by Does not apply route in the morning, at noon, and at bedtime. 06/14/21   Shelly Coss, MD  insulin regular (NOVOLIN R) 100 units/mL injection Inject 0-0.15 mLs (0-15 Units total) into the skin See admin instructions. Via pump.Start this medication after you follow up with your PCP Patient taking differently: Inject 0-15 Units into the skin See admin instructions. Via pump. 06/14/21   Shelly Coss, MD  metoCLOPramide (REGLAN) 10 MG tablet Take 1 tablet (10 mg total) by mouth every 8 (eight) hours as needed for nausea. 06/14/21 07/20/21  Shelly Coss, MD  pantoprazole (PROTONIX) 40 MG tablet Take 1 tablet (40 mg total)  by mouth 2 (two) times daily before a meal. 04/24/21   Aline August, MD  promethazine (PHENERGAN) 12.5 MG tablet Take 1 tablet (12.5 mg total) by mouth every 6 (six) hours as needed for nausea or vomiting. 07/01/21   Nita Sells, MD  TRUEPLUS LANCETS 28G MISC assist with checking blood sugar TID and qhs Patient taking differently: 1 each by Other route See admin instructions. assist with checking blood sugar TID and qhs 09/10/15   Zettie Pho S, PA-C  insulin aspart (NOVOLOG) 100 UNIT/ML  FlexPen Inject 5 Units into the skin 3 (three) times daily with meals. 02/23/18 07/05/19  Donne Hazel, MD      Allergies    Patient has no known allergies.    Review of Systems   Review of Systems  Constitutional:  Negative for fever.  Gastrointestinal:  Positive for abdominal pain and vomiting.    Physical Exam Updated Vital Signs BP (!) 212/192   Pulse (!) 112   Temp 98.5 F (36.9 C) (Oral)   Resp 20   Ht 1.727 m (_0 )   Wt 85.3 kg   SpO2 100%   BMI 28.59 kg/m  Physical Exam CONSTITUTIONAL: Ill-appearing HEAD: Normocephalic/atraumatic EYES: EOMI/PERRL ENMT: Mucous membranes dry NECK: supple no meningeal signs CV: S1/S2 noted, tachycardic LUNGS: Lungs are clear to auscultation bilaterally, no apparent distress ABDOMEN: soft, moderate epigastric tenderness, no rebound or guarding, bowel sounds noted throughout abdomen GU:no cva tenderness NEURO: Pt is awake/alert/appropriate, moves all extremitiesx4.  No facial droop.   EXTREMITIES:  full ROM SKIN: warm, color normal PSYCH: Anxious  ED Results / Procedures / Treatments   Labs (all labs ordered are listed, but only abnormal results are displayed) Labs Reviewed  COMPREHENSIVE METABOLIC PANEL - Abnormal; Notable for the following components:      Result Value   Glucose, Bld 369 (*)    BUN 22 (*)    Creatinine, Ser 1.04 (*)    Albumin 3.4 (*)    All other components within normal limits  CBC WITH DIFFERENTIAL/PLATELET - Abnormal; Notable for the following components:   WBC 13.6 (*)    Neutro Abs 10.7 (*)    All other components within normal limits  URINALYSIS, ROUTINE W REFLEX MICROSCOPIC - Abnormal; Notable for the following components:   Color, Urine STRAW (*)    Glucose, UA >=500 (*)    Hgb urine dipstick SMALL (*)    Ketones, ur 20 (*)    Protein, ur >=300 (*)    All other components within normal limits  LIPASE, BLOOD  I-STAT BETA HCG BLOOD, ED ([facility], [facility], [facility] ONLY)    EKG EKG  Interpretation  Date/Time:  Monday July 29 2021 03:39:23 EDT Ventricular Rate:  107 PR Interval:  189 QRS Duration: 81 QT Interval:  317 QTC Calculation: 423 R Axis:   62 Text Interpretation: Sinus tachycardia Abnormal Q suggests anterior infarct Borderline T abnormalities, inferior leads Confirmed by Ripley Fraise [id number]) on 07/29/2021 3:46:56 AM  Radiology No results found.  Procedures Procedures    Medications Ordered in ED Medications  HYDROmorphone (DILAUDID) injection 0.5 mg (has no administration in time range)  HYDROmorphone (DILAUDID) injection 1 mg (1 mg Intravenous Given 07/29/21 0311)  ondansetron (ZOFRAN) injection 4 mg (4 mg Intravenous Given 07/29/21 0312)  sodium chloride 0.9 % bolus 1,000 mL (0 mLs Intravenous Stopped 07/29/21 0412)  metoCLOPramide (REGLAN) injection 10 mg (10 mg Intravenous Given 07/29/21 0400)  diphenhydrAMINE (BENADRYL) injection 25 mg (25 mg Intravenous Given 07/29/21  0400)  haloperidol lactate (HALDOL) injection 2 mg (2 mg Intravenous Given 07/29/21 0502)    ED Course/ Medical Decision Making/ A&P Clinical Course as of 07/29/21 0546  Mon Jul 29, 2021  0315 WBC(!): 13.6 Leukocytosis noted [DW]  0319 Patient w/long history of diabetes and gastroparesis presents with intractable vomiting.  She has had extensive work-up previously.  She is requesting IV Dilaudid [DW]  [phone] Patient with some improvement, but still reporting nausea, will start with Reglan and Benadryl [DW]  0438 Glucose(!): 369 Hyperglycemia without anion gap [DW]  0439 BUN(!): 22 Dehydration noted [DW]  0546 Patient reporting continued pain and nausea.  She has been given multiple antiemetics and pain medicines without relief.  Patient will be admitted.  Discussed with Dr. Bridgett Larsson for admission [DW]    Clinical Course User Index [DW] Ripley Fraise, MD                           Medical Decision Making Amount and/or Complexity of Data Reviewed Labs: ordered. Decision-making  details documented in ED Course. ECG/medicine tests: ordered.  Risk Prescription drug management. Decision regarding hospitalization.   This patient presents to the ED for concern of abdominal pain and vomiting, this involves an extensive number of treatment options, and is a complaint that carries with it a high risk of complications and morbidity.  The differential diagnosis includes but is not limited to cholecystitis, cholelithiasis, pancreatitis, gastritis, peptic ulcer disease, appendicitis, bowel obstruction, bowel perforation, diverticulitis, AAA, ischemic bowel, pregnancy complications  Comorbidities that complicate the patient evaluation: Patient's presentation is complicated by their history of diabetes and gastroparesis   Additional history obtained: Records reviewed previous admission documents  Lab Tests: I Ordered, and personally interpreted labs.  The pertinent results include: Leukocytosis, hyperglycemia  Cardiac Monitoring: The patient was maintained on a cardiac monitor.  I personally viewed and interpreted the cardiac monitor which showed an underlying rhythm of:  sinus tachycardia  Medicines ordered and prescription drug management: I ordered medication including Dilaudid and Zofran for pain and nausea Reevaluation of the patient after these medicines showed that the patient    stayed the same  Test Considered: Considered imaging, patient has had multiple imaging modalities in the past  Critical Interventions:  IV fluids  Consultations Obtained: I requested consultation with the admitting physician Dr. Bridgett Larsson , and discussed  findings as well as pertinent plan - they recommend: We will admit  Reevaluation: After the interventions noted above, I reevaluated the patient and found that they have :stayed the same  Complexity of problems addressed: Patient's presentation is most consistent with  acute presentation with potential threat to life or bodily  function  Disposition: After consideration of the diagnostic results and the patient's response to treatment,  I feel that the patent would benefit from admission   .           Final Clinical Impression(s) / ED Diagnoses Final diagnoses:  Gastroparesis  Intractable nausea and vomiting    Rx / DC Orders ED Discharge Orders     None         Ripley Fraise, MD 07/29/21 [phone]

## 2021-07-30 DIAGNOSIS — Z9641 Presence of insulin pump (external) (internal): Secondary | ICD-10-CM | POA: Diagnosis present

## 2021-07-30 DIAGNOSIS — Z833 Family history of diabetes mellitus: Secondary | ICD-10-CM | POA: Diagnosis not present

## 2021-07-30 DIAGNOSIS — E785 Hyperlipidemia, unspecified: Secondary | ICD-10-CM

## 2021-07-30 DIAGNOSIS — Z8711 Personal history of peptic ulcer disease: Secondary | ICD-10-CM | POA: Diagnosis not present

## 2021-07-30 DIAGNOSIS — Z89422 Acquired absence of other left toe(s): Secondary | ICD-10-CM | POA: Diagnosis not present

## 2021-07-30 DIAGNOSIS — K3184 Gastroparesis: Secondary | ICD-10-CM | POA: Diagnosis present

## 2021-07-30 DIAGNOSIS — E1043 Type 1 diabetes mellitus with diabetic autonomic (poly)neuropathy: Secondary | ICD-10-CM | POA: Diagnosis present

## 2021-07-30 DIAGNOSIS — I152 Hypertension secondary to endocrine disorders: Secondary | ICD-10-CM | POA: Diagnosis present

## 2021-07-30 DIAGNOSIS — R112 Nausea with vomiting, unspecified: Secondary | ICD-10-CM | POA: Diagnosis present

## 2021-07-30 DIAGNOSIS — E1042 Type 1 diabetes mellitus with diabetic polyneuropathy: Secondary | ICD-10-CM | POA: Diagnosis present

## 2021-07-30 DIAGNOSIS — Z794 Long term (current) use of insulin: Secondary | ICD-10-CM | POA: Diagnosis not present

## 2021-07-30 DIAGNOSIS — E0590 Thyrotoxicosis, unspecified without thyrotoxic crisis or storm: Secondary | ICD-10-CM | POA: Diagnosis present

## 2021-07-30 DIAGNOSIS — K219 Gastro-esophageal reflux disease without esophagitis: Secondary | ICD-10-CM

## 2021-07-30 DIAGNOSIS — E1069 Type 1 diabetes mellitus with other specified complication: Secondary | ICD-10-CM | POA: Diagnosis present

## 2021-07-30 DIAGNOSIS — E1065 Type 1 diabetes mellitus with hyperglycemia: Secondary | ICD-10-CM | POA: Diagnosis present

## 2021-07-30 DIAGNOSIS — E1159 Type 2 diabetes mellitus with other circulatory complications: Secondary | ICD-10-CM

## 2021-07-30 DIAGNOSIS — Z79899 Other long term (current) drug therapy: Secondary | ICD-10-CM | POA: Diagnosis not present

## 2021-07-30 LAB — GLUCOSE, CAPILLARY
Glucose-Capillary: 220 mg/dL — ABNORMAL HIGH (ref 70–99)
Glucose-Capillary: 193 mg/dL — ABNORMAL HIGH (ref 70–99)
Glucose-Capillary: 156 mg/dL — ABNORMAL HIGH (ref 70–99)
Glucose-Capillary: 248 mg/dL — ABNORMAL HIGH (ref 70–99)

## 2021-07-30 LAB — COMPREHENSIVE METABOLIC PANEL
ALT: 17 U/L (ref 0–44)
AST: 18 U/L (ref 15–41)
Albumin: 2.6 g/dL — ABNORMAL LOW (ref 3.5–5.0)
Alkaline Phosphatase: 65 U/L (ref 38–126)
Anion gap: 8 (ref 5–15)
BUN: 24 mg/dL — ABNORMAL HIGH (ref 6–20)
CO2: 25 mmol/L (ref 22–32)
Calcium: 9 mg/dL (ref 8.9–10.3)
Chloride: 104 mmol/L (ref 98–111)
Creatinine, Ser: 1.02 mg/dL — ABNORMAL HIGH (ref 0.44–1.00)
GFR, Estimated: >60 mL/min (ref >60)
Glucose, Bld: 166 mg/dL — ABNORMAL HIGH (ref 70–99)
Potassium: 3.9 mmol/L (ref 3.5–5.1)
Sodium: 137 mmol/L (ref 135–145)
Total Bilirubin: 0.6 mg/dL (ref 0.3–1.2)
Total Protein: 6 g/dL — ABNORMAL LOW (ref 6.5–8.1)

## 2021-07-30 LAB — CBC
HCT: 31.1 % — ABNORMAL LOW (ref 36.0–46.0)
Hemoglobin: 10.1 g/dL — ABNORMAL LOW (ref 12.0–15.0)
MCH: 29.1 pg (ref 26.0–34.0)
MCHC: 32.5 g/dL (ref 30.0–36.0)
MCV: 89.6 fL (ref 80.0–100.0)
Platelets: 327 10*3/uL (ref 150–400)
RBC: 3.47 MIL/uL — ABNORMAL LOW (ref 3.87–5.11)
RDW: 13.4 % (ref 11.5–15.5)
WBC: 13.5 10*3/uL — ABNORMAL HIGH (ref 4.0–10.5)
nRBC: 0 % (ref 0.0–0.2)

## 2021-07-30 MED ORDER — CARVEDILOL 6.25 MG PO TABS
6.2500 mg | ORAL_TABLET | Freq: Two times a day (BID) | ORAL | Status: DC
  Administered 2021-07-30 – 2021-07-31 (×2): 6.25 mg via ORAL
  Filled 2021-07-30 (×2): qty 1

## 2021-07-30 MED ORDER — SALINE SPRAY 0.65 % NA SOLN
1.0000 | NASAL | Status: DC | PRN
  Filled 2021-07-30: qty 44

## 2021-07-30 MED ORDER — METOCLOPRAMIDE HCL 5 MG/ML IJ SOLN
10.0000 mg | Freq: Four times a day (QID) | INTRAMUSCULAR | Status: DC
  Administered 2021-07-30 – 2021-07-31 (×5): 10 mg via INTRAVENOUS
  Filled 2021-07-30 (×5): qty 2

## 2021-07-30 NOTE — Progress Notes (Signed)
[facility] Progress Note  Patient: Kelli Hudson     ZJQ:[id number]  DOA: 07/29/2021   PCP: Loyola Mast, MD       Brief hospital course: This is a 31 year old female with type 1 diabetes mellitus associated with diabetic gastroparesis, hypertension, hyper thyroidism who presents to the hospital for intractable nausea and vomiting despite taking her home antiemetics. She was started on IV antiemetics and IV fluids.  Subjective:  Tolerating clear liquids.  Feels like she could be advanced to full liquids for lunch time. Assessment and Plan: Principal Problem:   Intractable nausea and vomiting likely secondary to diabetic gastroparesis - Currently receiving Compazine every 6 hours routine and Zofran every 6 hours as needed - We will advance to full liquids today  -Every 6 Compazine should be switched to every 6 Reglan IV  Active Problems:   Type 1 diabetes mellitus (HCC), uncontrolled with hyperglycemia - Continue Levemir and sliding scale insulin -A1c was 9.9 on 02/25/2021 and 9.7 on 05/28/2021-last year it was 10.5 - When she is able to tolerate solids, I feel that we will need to increase her home doses of insulin -Hemoglobin A1C    Component Value Date/Time   HGBA1C 9.7 (H) 05/28/2021 1112      Hypertension associated with diabetes (HCC) -Carvedilol is on hold-we will resume with holding parameters    HLD (hyperlipidemia) -Can resume Lipitor when she returns home    GERD (gastroesophageal reflux disease) -Can continue Protonix   DVT prophylaxis:  enoxaparin (LOVENOX) injection 40 mg Start: 07/29/21 1000   Code Status: Full Code  Consultants: None Level of Care: Level of care: Stepdown Disposition Plan:  Status is: Observation The patient will require care spanning > 2 midnights and should be moved to inpatient because: Ongoing intractable nausea-slowly advancing diet-on IV antiemetics  Objective:   Vitals:   07/30/21 0755 07/30/21 0800 07/30/21  0805 07/30/21 0810  BP:      Pulse: 99 99 97 96  Resp: (!) 8 (!) 8 16 10   Temp:      TempSrc:      SpO2: 98% 99% 99% 100%  Weight:      Height:       Filed Weights   07/29/21 0141 07/29/21 0908  Weight: 85.3 kg 81.2 kg   Exam: General exam: Appears comfortable  HEENT: PERRLA, oral mucosa moist, no sclera icterus or thrush Respiratory system: Clear to auscultation. Respiratory effort normal. Cardiovascular system: S1 & S2 heard, regular rate and rhythm Gastrointestinal system: Abdomen soft, tender in upper half of abdomen, nondistended. Normal bowel sounds   Central nervous system: Alert and oriented. No focal neurological deficits. Extremities: No cyanosis, clubbing or edema Skin: No rashes or ulcers Psychiatry: Flat affect  Imaging and lab data was personally reviewed    CBC: Recent Labs  Lab 07/29/21 0254 07/30/21 0245  WBC 13.6* 13.5*  NEUTROABS 10.7*  --   HGB 12.2 10.1*  HCT 36.3 31.1*  MCV 87.7 89.6  PLT 398 327   Basic Metabolic Panel: Recent Labs  Lab 07/29/21 0254 07/29/21 0800 07/29/21 1647 07/29/21 2135 07/30/21 0245  NA 135 135  --  140 137  K 4.0 5.6* 6.1* 4.4 3.9  CL 100 100  --  105 104  CO2 23 23  --  27 25  GLUCOSE 369* 399*  --  132* 166*  BUN 22* 21*  --  24* 24*  CREATININE 1.04* 0.93  --  1.12* 1.02*  CALCIUM 9.9 9.4  --  9.2 9.0  PHOS  --  4.6  --  4.4  --    GFR: Estimated Creatinine Clearance: 90.1 mL/min (A) (by C-G formula based on SCr of 1.02 mg/dL (H)).  Scheduled Meds:  Chlorhexidine Gluconate Cloth  6 each Topical Daily   enoxaparin (LOVENOX) injection  40 mg Subcutaneous Q24H   insulin aspart  0-15 Units Subcutaneous TID WC   insulin aspart  0-5 Units Subcutaneous QHS   insulin detemir  16 Units Subcutaneous Daily   pantoprazole (PROTONIX) IV  40 mg Intravenous Q24H   prochlorperazine  10 mg Intravenous Q6H   Continuous Infusions:  lactated ringers 100 mL/hr at 07/30/21 0812     LOS: 0 days   Author: Ladell Heads  Heston Widener  07/30/2021 10:23 AM

## 2021-07-31 ENCOUNTER — Telehealth: Payer: 59

## 2021-07-31 LAB — GLUCOSE, CAPILLARY
Glucose-Capillary: 194 mg/dL — ABNORMAL HIGH (ref 70–99)
Glucose-Capillary: 253 mg/dL — ABNORMAL HIGH (ref 70–99)

## 2021-07-31 MED ORDER — DICYCLOMINE HCL 20 MG PO TABS: 20.0000 mg | ORAL_TABLET | Freq: Three times a day (TID) | ORAL | 0 refills | Status: DC | PRN

## 2021-07-31 MED ORDER — PROMETHAZINE HCL 12.5 MG PO TABS: 12.5000 mg | ORAL_TABLET | Freq: Four times a day (QID) | ORAL | 0 refills | Status: DC | PRN

## 2021-07-31 MED ORDER — METOCLOPRAMIDE HCL 10 MG PO TABS: 10.0000 mg | ORAL_TABLET | Freq: Three times a day (TID) | ORAL | 0 refills | Status: DC | PRN

## 2021-07-31 MED ORDER — HYDROXYZINE PAMOATE 25 MG PO CAPS: 25.0000 mg | ORAL_CAPSULE | Freq: Three times a day (TID) | ORAL | 0 refills | Status: DC | PRN

## 2021-07-31 MED ORDER — BLOOD GLUCOSE METER KIT: PACK | 0 refills | Status: DC

## 2021-07-31 MED ORDER — PANTOPRAZOLE SODIUM 40 MG PO TBEC
40.0000 mg | DELAYED_RELEASE_TABLET | Freq: Every day | ORAL | Status: DC
  Administered 2021-07-31: 40 mg via ORAL
  Filled 2021-07-31: qty 1

## 2021-07-31 NOTE — Progress Notes (Signed)
Inpatient Diabetes Program Recommendations  AACE/ADA: New Consensus Statement on Inpatient Glycemic Control (2015)  Target Ranges:  Prepandial:   less than 140 mg/dL      Peak postprandial:   less than 180 mg/dL (1-2 hours)      Critically ill patients:  140 - 180 mg/dL   Lab Results  Component Value Date   GLUCAP 194 (H) 07/31/2021   HGBA1C 9.7 (H) 05/28/2021    Review of Glycemic Control  Diabetes history: DM1 Outpatient Diabetes medications: insulin pump (Tandem and Dexcome G6) Current orders for Inpatient glycemic control: Levemir 16 QD, Novolog 0-15 TID with meals and 0-5 HS  HgbA1C - 9.7%  Inpatient Diabetes Program Recommendations:    For discharge: (Has Endo appt in July 2023)  Restart pump if Dexcom supplies have come in.  Make certain pt suspends basal rates on pump until 24H after last Levemir dose. Can use bolus on pump when discharged.   Will need prescription for Levemir 16 units QAM if pt is unable to use her pump for any reason.  Also needs Blood glucose monitoring prescription if pt is unable to use Dexcom (Order # [id number])  Pt has Novolog at home.  Will review holding basal on pump x 24H after Levemir SQ dose was given in hospital.  Thank you. Ailene Ards, RD, LDN, CDE Inpatient Diabetes Coordinator [phone]

## 2021-07-31 NOTE — Progress Notes (Signed)
Pt stable at this time. Pt educated & provided avs discharge packet. Pt has all belongings.

## 2021-07-31 NOTE — Progress Notes (Signed)
PHARMACIST - PHYSICIAN COMMUNICATION   CONCERNING: IV to Oral Route Change Policy  RECOMMENDATION: This patient is receiving pantoprazole by the intravenous route.  Based on criteria approved by the Pharmacy and Therapeutics Committee, the intravenous medication(s) is/are being converted to the equivalent oral dose form(s).   DESCRIPTION: These criteria include: The patient is eating (either orally or via tube) and/or has been taking other orally administered medications for a least 24 hours The patient has no evidence of active gastrointestinal bleeding or impaired GI absorption (gastrectomy, short bowel, patient on TNA or NPO).  If you have questions about this conversion, please contact the [place], PharmD, BCPS Clinical Pharmacist 07/31/2021 7:51 AM

## 2021-08-01 ENCOUNTER — Telehealth: Payer: Self-pay

## 2021-08-01 NOTE — Telephone Encounter (Signed)
Transition Care Management Unsuccessful Follow-up Telephone Call  Date of discharge and from where:  07/31/2021  Attempts:  1st Attempt  Reason for unsuccessful TCM follow-up call:  No answer/busy

## 2021-08-01 NOTE — Telephone Encounter (Signed)
Transition Care Management Unsuccessful Follow-up Telephone Call  Date of discharge and from where:  07/31/2021 [facility]   Attempts:  2nd Attempt  Reason for unsuccessful TCM follow-up call:  No answer/busy

## 2021-08-01 NOTE — Discharge Summary (Signed)
Physician Discharge Summary  Kelli Hudson FHL:[id number] DOB: January 14, 1991 DOA: 07/29/2021  PCP: [name], MD  Admit date: 07/29/2021 Discharge date: 08/01/2021  Admitted From: Home Disposition: Home  Recommendations for Outpatient Follow-up:  Follow up with PCP in 1-2 weeks Please obtain BMP/CBC in one week  Home Health: None Equipment/Devices: None  Discharge Condition: Stable CODE STATUS: Full code Diet recommendation: Carb modified  Brief/Interim Summary: 31 year old female with type 1 diabetes mellitus associated with diabetic gastroparesis, hypertension, hyper thyroidism who presents to the hospital for intractable nausea and vomiting despite taking her home antiemetics.  Discharge Diagnoses:  Principal Problem:   Intractable nausea and vomiting Active Problems:   Diabetic gastroparesis associated with type 1 diabetes mellitus (HCC)   Type 1 diabetes mellitus with diabetic polyneuropathy (HCC)   Hypertension associated with diabetes ([place])   HLD (hyperlipidemia)   GERD (gastroesophageal reflux disease)   Gastroparesis   Intractable nausea and vomiting  secondary to diabetic gastroparesis-she was treated symptomatically with Compazine and Zofran.  Patient tolerated a carb modified diet prior to discharge.   Active Problems:   Type 1 diabetes mellitus ([place]), uncontrolled with hyperglycemia-patient has insulin pump at home.  She was seen by diabetic coordinator who discussed with the patient to suspend the basal rate on the pump until 24 hours after last Levemir dose.  Can use bolus on the pump when discharge.  If she was unable to use the pump for any reason to use Levemir 16 units every morning.  Patient also has NovoLog at home.     Hypertension associated with diabetes (HCC)-continue carvedilol.     HLD (hyperlipidemia) continue Lipitor.     GERD (gastroesophageal reflux disease)  continue Protonix    Estimated body mass index is 27.22 kg/m as  calculated from the following:   Height as of this encounter: 5' 8"  (1.727 m).   Weight as of this encounter: 81.2 kg.  Discharge Instructions  Discharge Instructions     Diet - low sodium heart healthy   Complete by: As directed    Increase activity slowly   Complete by: As directed       Allergies as of 07/31/2021   No Known Allergies      Medication List     TAKE these medications    albuterol 108 (90 Base) MCG/ACT inhaler Commonly known as: VENTOLIN HFA Inhale 2 puffs into the lungs every 6 (six) hours as needed for shortness of breath.   AMBULATORY NON FORMULARY MEDICATION Medication Name: Domperidone 10 mg: Take one tablet by mouth three times a day   atorvastatin 20 MG tablet Commonly known as: LIPITOR Take 1 tablet (20 mg total) by mouth daily.   blood glucose meter kit and supplies Dispense based on patient and insurance preference. Use up to four times daily as directed. (FOR ICD-10 E10.9, E11.9). What changed: Another medication with the same name was added. Make sure you understand how and when to take each.   blood glucose meter kit and supplies Dispense based on patient and insurance preference. Use up to four times daily as directed. (FOR ICD-10 E10.9, E11.9). What changed: You were already taking a medication with the same name, and this prescription was added. Make sure you understand how and when to take each.   carvedilol 6.25 MG tablet Commonly known as: COREG Take 1 tablet (6.25 mg total) by mouth 2 (two) times daily with a meal.   Dexcom G6 Sensor Misc 1 each by Does not apply route daily.  Dexcom G6 Transmitter Misc 1 each by Does not apply route daily.   dicyclomine 20 MG tablet Commonly known as: BENTYL Take 1 tablet (20 mg total) by mouth 3 (three) times daily as needed for spasms.   hydrOXYzine 25 MG capsule Commonly known as: VISTARIL Take 1 capsule (25 mg total) by mouth every 8 (eight) hours as needed. What changed: reasons to  take this   insulin regular 100 units/mL injection Commonly known as: NovoLIN R Inject 0-0.15 mLs (0-15 Units total) into the skin See admin instructions. Via pump.Start this medication after you follow up with your PCP What changed: additional instructions   Levemir FlexTouch 100 UNIT/ML FlexPen Generic drug: insulin detemir Inject 16 Units into the skin daily.   metoCLOPramide 10 MG tablet Commonly known as: Reglan Take 1 tablet (10 mg total) by mouth every 8 (eight) hours as needed for nausea.   pantoprazole 40 MG tablet Commonly known as: PROTONIX Take 1 tablet (40 mg total) by mouth 2 (two) times daily before a meal.   Pen Needles 3/16" 31G X 5 MM Misc 1 each by Does not apply route in the morning, at noon, and at bedtime.   promethazine 12.5 MG tablet Commonly known as: PHENERGAN Take 1 tablet (12.5 mg total) by mouth every 6 (six) hours as needed for nausea or vomiting.   True Metrix Blood Glucose Test test strip Generic drug: glucose blood Use as instructed What changed:  how much to take how to take this when to take this   TRUEplus Lancets 28G Misc assist with checking blood sugar TID and qhs What changed:  how much to take how to take this when to take this        No Known Allergies  Consultations: none   Procedures/Studies: No results found. (Echo, Carotid, EGD, Colonoscopy, ERCP)    Subjective: Patient ambulating in the room anxious to go home denies nausea vomiting tolerated a carb modified diet  Discharge Exam: Vitals:   07/31/21 1100 07/31/21 1133  BP:    Pulse: (!) 102   Resp: 13   Temp:  98.3 F (36.8 C)  SpO2: 98%    Vitals:   07/31/21 0900 07/31/21 1000 07/31/21 1100 07/31/21 1133  BP:      Pulse: (!) 107 (!) 107 (!) 102   Resp: 12 (!) 9 13   Temp:    98.3 F (36.8 C)  TempSrc:    Oral  SpO2: 99% 98% 98%   Weight:      Height:        General: Pt is alert, awake, not in acute distress Cardiovascular: RRR, S1/S2 +,  no rubs, no gallops Respiratory: CTA bilaterally, no wheezing, no rhonchi Abdominal: Soft, NT, ND, bowel sounds + Extremities: no edema, no cyanosis    The results of significant diagnostics from this hospitalization (including imaging, microbiology, ancillary and laboratory) are listed below for reference.     Microbiology: Recent Results (from the past 240 hour(s))  MRSA Next Gen by PCR, Nasal     Status: None   Collection Time: 07/29/21  9:19 AM   Specimen: Nasal Mucosa; Nasal Swab  Result Value Ref Range Status   MRSA by PCR Next Gen NOT DETECTED NOT DETECTED Final    Comment: (NOTE) The GeneXpert MRSA Assay (FDA approved for NASAL specimens only), is one component of a comprehensive MRSA colonization surveillance program. It is not intended to diagnose MRSA infection nor to guide or monitor treatment for MRSA infections. Test  performance is not FDA approved in patients less than 2 years old. Performed at [facility], [place] [address] [place], [place] [address]      Labs: BNP (last 3 results) No results for input(s): "BNP" in the last 8760 hours. Basic Metabolic Panel: Recent Labs  Lab 07/29/21 0254 07/29/21 0800 07/29/21 1647 07/29/21 2135 07/30/21 0245  NA 135 135  --  140 137  K 4.0 5.6* 6.1* 4.4 3.9  CL 100 100  --  105 104  CO2 23 23  --  27 25  GLUCOSE 369* 399*  --  132* 166*  BUN 22* 21*  --  24* 24*  CREATININE 1.04* 0.93  --  1.12* 1.02*  CALCIUM 9.9 9.4  --  9.2 9.0  PHOS  --  4.6  --  4.4  --    Liver Function Tests: Recent Labs  Lab 07/29/21 0254 07/29/21 0800 07/29/21 2135 07/30/21 0245  AST 19  --   --  18  ALT 22  --   --  17  ALKPHOS 99  --   --  65  BILITOT 0.7  --   --  0.6  PROT 7.8  --   --  6.0*  ALBUMIN 3.4* 3.1* 2.8* 2.6*   Recent Labs  Lab 07/29/21 0254  LIPASE 30   No results for input(s): "AMMONIA" in the last 168 hours. CBC: Recent Labs  Lab 07/29/21 0254 07/30/21 0245  WBC 13.6* 13.5*   NEUTROABS 10.7*  --   HGB 12.2 10.1*  HCT 36.3 31.1*  MCV 87.7 89.6  PLT 398 327   Cardiac Enzymes: No results for input(s): "CKTOTAL", "CKMB", "CKMBINDEX", "TROPONINI" in the last 168 hours. BNP: Invalid input(s): "POCBNP" CBG: Recent Labs  Lab 07/30/21 1229 07/30/21 1728 07/30/21 2201 07/31/21 0825 07/31/21 1131  GLUCAP 193* 156* 248* 194* 253*   D-Dimer No results for input(s): "DDIMER" in the last 72 hours. Hgb A1c No results for input(s): "HGBA1C" in the last 72 hours. Lipid Profile No results for input(s): "CHOL", "HDL", "LDLCALC", "TRIG", "CHOLHDL", "LDLDIRECT" in the last 72 hours. Thyroid function studies No results for input(s): "TSH", "T4TOTAL", "T3FREE", "THYROIDAB" in the last 72 hours.  Invalid input(s): "FREET3" Anemia work up No results for input(s): "VITAMINB12", "FOLATE", "FERRITIN", "TIBC", "IRON", "RETICCTPCT" in the last 72 hours. Urinalysis    Component Value Date/Time   COLORURINE STRAW (A) 07/29/2021 0506   APPEARANCEUR CLEAR 07/29/2021 0506   LABSPEC 1.016 07/29/2021 0506   PHURINE 7.0 07/29/2021 0506   GLUCOSEU >=500 (A) 07/29/2021 0506   GLUCOSEU NEGATIVE 05/22/2020 1417   HGBUR SMALL (A) 07/29/2021 0506   BILIRUBINUR NEGATIVE 07/29/2021 0506   KETONESUR 20 (A) 07/29/2021 0506   PROTEINUR >=300 (A) 07/29/2021 0506   UROBILINOGEN 0.2 05/22/2020 1417   NITRITE NEGATIVE 07/29/2021 0506   LEUKOCYTESUR NEGATIVE 07/29/2021 0506   Sepsis Labs Recent Labs  Lab 07/29/21 0254 07/30/21 0245  WBC 13.6* 13.5*   Microbiology Recent Results (from the past 240 hour(s))  MRSA Next Gen by PCR, Nasal     Status: None   Collection Time: 07/29/21  9:19 AM   Specimen: Nasal Mucosa; Nasal Swab  Result Value Ref Range Status   MRSA by PCR Next Gen NOT DETECTED NOT DETECTED Final    Comment: (NOTE) The GeneXpert MRSA Assay (FDA approved for NASAL specimens only), is one component of a comprehensive MRSA colonization surveillance program. It is not  intended to diagnose MRSA infection nor to guide or  monitor treatment for MRSA infections. Test performance is not FDA approved in patients less than 23 years old. Performed at [facility], [place] [address], [place], [place] [address]      Time coordinating discharge: 39 minutes  SIGNED:   Georgette Shell, MD  [facility] 08/01/2021, 6:14 PM

## 2021-08-02 ENCOUNTER — Telehealth: Payer: Self-pay | Admitting: *Deleted

## 2021-08-02 NOTE — Chronic Care Management (AMB) (Signed)
  Care Coordination Note  08/02/2021 Name: Daliana Leverett MRN: [id number] DOB: 06/10/1990  Kelli Hudson is a 31 y.o. year old female who is a primary care patient of Loyola Mast, MD and is actively engaged with the care management team. I reached out to [employer] by phone today to assist with re-scheduling a follow up visit with the RN Case Manager  Follow up plan: Unsuccessful telephone outreach attempt made. A HIPAA compliant phone message was left for the patient providing contact information and requesting a return call.   [name], CCMA Care Guide, Embedded Care Coordination [facility]  Care Management  Direct Dial: [phone]

## 2021-08-06 ENCOUNTER — Telehealth: Payer: Self-pay

## 2021-08-06 NOTE — Telephone Encounter (Signed)
-----   Message from Benancio Deeds, MD sent at 07/31/2021  2:51 PM EDT ----- Regarding: RE: Chart check Oh no, so sorry to hear this. We need her out of the hospital in order to discuss further outpatient evaluation at a tertiary care center. They will call us if they need our help while she is hospitalized. Can you check on her next week? Need to know if she has been on domperidone yet or not. Thanks  ----- Message ----- From: Missy Sabins, RN Sent: 07/31/2021   9:31 AM EDT To: Benancio Deeds, MD Subject: FW: Chart check                                [name], patient was discharged and is now re-admitted to the hospital again.  ----- Message ----- From: Missy Sabins, RN Sent: 07/31/2021  12:00 AM EDT To: Missy Sabins, RN Subject: Chart check                                    Check patient's chart to see if she has been discharged

## 2021-08-06 NOTE — Telephone Encounter (Signed)
Thanks [facility].  Started here she has had some trouble with this.  We tried to refer her to [place], [facility] and no one is doing gastric pacemaker evaluation right now.  I really think domperidone is her next best option.  Can you clarify what the issue is with the medication and if she can get it?  I think we were trying to order this from [place]. Dosing is 10 mg 3 times daily.  She will need an EKG and monitoring while she is on this.  She needs to contact us when she is able to start it so we can monitor her.  Can you let me know if there is anything we can do to help her get this medication?  Thanks

## 2021-08-06 NOTE — Telephone Encounter (Signed)
Called and spoke with patient. She is aware that [facility] is unable to accept the referral. Pt states that she has not been able to start the Domperidone, pt states that she has not heard any update on the medication. I did inform the pt that you are aware that she has been hospitalized recently. I told her I will get this information to you and see what you recommend. Please advise, thanks.

## 2021-08-07 NOTE — Telephone Encounter (Signed)
Called [facility] ([phone], F: [phone]), I spoke Twinkle, she states that the prescription for Domperidone was received. A 54-month supply will be $54.99 and a 30-month supply $119.99. Twinkle states that they will contact patient to set up payment and shipment of the medication.   I called the patient to inform her of the information that I received from the pharmacy. I told pt to expect a call from the pharmacy to discuss payment/shipment. Pt wanted to know if her insurance would cover it and I told her that I didn't think so, since we are receiving this from [place]. I offered to give the patient the # to the pharmacy and she stated that she would "wait for the pharmacy to contact her". Pt is aware that we will need to be notified when she starts the medication since she has to be closely monitored while taking this medication. I told pt to call us back if she has not heard from the pharmacy in a week. Pt verbalized understanding and had no concerns at the end of the call.

## 2021-08-12 ENCOUNTER — Ambulatory Visit: Payer: 59 | Admitting: Family Medicine

## 2021-08-12 NOTE — Chronic Care Management (AMB) (Signed)
  Care Coordination Note  08/12/2021 Name: Kelli Hudson MRN: [id number] DOB: [birth year]  Kelli Hudson is a 31 y.o. year old female who is a primary care patient of Loyola Mast, MD and is actively engaged with the care management team. I reached out to [employer] by phone today to assist with re-scheduling a follow up visit with the RN Case Manager  Follow up plan: 2nd Unsuccessful telephone outreach attempt made. A HIPAA compliant phone message was left for the patient providing contact information and requesting a return call.   [name], CCMA Care Guide, Embedded Care Coordination [facility]  Care Management  Direct Dial: [phone]

## 2021-08-14 ENCOUNTER — Telehealth: Payer: Self-pay

## 2021-08-14 NOTE — Telephone Encounter (Signed)
Called [facility] [facility] and spoke with [place]. I was informed that a vm was left for pt on 08/07/21. They were able to speak with patient on 08/11/21. Pt told them that she did not want the medication and they order was cancelled.

## 2021-08-14 NOTE — Telephone Encounter (Signed)
-----   Message from Missy Sabins, RN sent at 08/07/2021  3:30 PM EDT ----- Regarding: [facility] pharmacy Check to see if patient received Domperidone  Call [place] pharmacy at [phone].

## 2021-08-16 ENCOUNTER — Encounter (HOSPITAL_COMMUNITY): Payer: Self-pay

## 2021-08-16 ENCOUNTER — Inpatient Hospital Stay (HOSPITAL_COMMUNITY)
Admission: EM | Admit: 2021-08-16 | Discharge: 2021-08-19 | DRG: 074 | Disposition: A | Discharge status: Home / Self Care | Payer: 59 | Attending: Family Medicine | Admitting: Family Medicine

## 2021-08-16 DIAGNOSIS — Z833 Family history of diabetes mellitus: Secondary | ICD-10-CM | POA: Diagnosis not present

## 2021-08-16 DIAGNOSIS — E1069 Type 1 diabetes mellitus with other specified complication: Secondary | ICD-10-CM | POA: Diagnosis present

## 2021-08-16 DIAGNOSIS — E1159 Type 2 diabetes mellitus with other circulatory complications: Secondary | ICD-10-CM | POA: Diagnosis present

## 2021-08-16 DIAGNOSIS — E0590 Thyrotoxicosis, unspecified without thyrotoxic crisis or storm: Secondary | ICD-10-CM | POA: Diagnosis present

## 2021-08-16 DIAGNOSIS — R112 Nausea with vomiting, unspecified: Secondary | ICD-10-CM | POA: Diagnosis present

## 2021-08-16 DIAGNOSIS — E1043 Type 1 diabetes mellitus with diabetic autonomic (poly)neuropathy: Principal | ICD-10-CM | POA: Diagnosis present

## 2021-08-16 DIAGNOSIS — Z79899 Other long term (current) drug therapy: Secondary | ICD-10-CM

## 2021-08-16 DIAGNOSIS — E1042 Type 1 diabetes mellitus with diabetic polyneuropathy: Secondary | ICD-10-CM | POA: Diagnosis not present

## 2021-08-16 DIAGNOSIS — R109 Unspecified abdominal pain: Principal | ICD-10-CM

## 2021-08-16 DIAGNOSIS — I152 Hypertension secondary to endocrine disorders: Secondary | ICD-10-CM | POA: Diagnosis present

## 2021-08-16 DIAGNOSIS — E872 Acidosis, unspecified: Secondary | ICD-10-CM | POA: Diagnosis present

## 2021-08-16 DIAGNOSIS — K219 Gastro-esophageal reflux disease without esophagitis: Secondary | ICD-10-CM | POA: Diagnosis present

## 2021-08-16 DIAGNOSIS — Z794 Long term (current) use of insulin: Secondary | ICD-10-CM

## 2021-08-16 DIAGNOSIS — K3184 Gastroparesis: Secondary | ICD-10-CM | POA: Diagnosis present

## 2021-08-16 DIAGNOSIS — D72829 Elevated white blood cell count, unspecified: Secondary | ICD-10-CM | POA: Diagnosis present

## 2021-08-16 DIAGNOSIS — Z89422 Acquired absence of other left toe(s): Secondary | ICD-10-CM

## 2021-08-16 DIAGNOSIS — E785 Hyperlipidemia, unspecified: Secondary | ICD-10-CM | POA: Diagnosis present

## 2021-08-16 LAB — COMPREHENSIVE METABOLIC PANEL
ALT: 21 U/L (ref 0–44)
AST: 19 U/L (ref 15–41)
Albumin: 3.4 g/dL — ABNORMAL LOW (ref 3.5–5.0)
Alkaline Phosphatase: 89 U/L (ref 38–126)
Anion gap: 10 (ref 5–15)
BUN: 23 mg/dL — ABNORMAL HIGH (ref 6–20)
CO2: 27 mmol/L (ref 22–32)
Calcium: 10.1 mg/dL (ref 8.9–10.3)
Chloride: 102 mmol/L (ref 98–111)
Creatinine, Ser: 1.34 mg/dL — ABNORMAL HIGH (ref 0.44–1.00)
GFR, Estimated: 55 mL/min — ABNORMAL LOW (ref >60)
Glucose, Bld: 155 mg/dL — ABNORMAL HIGH (ref 70–99)
Potassium: 4.2 mmol/L (ref 3.5–5.1)
Sodium: 139 mmol/L (ref 135–145)
Total Bilirubin: 0.8 mg/dL (ref 0.3–1.2)
Total Protein: 8 g/dL (ref 6.5–8.1)

## 2021-08-16 LAB — CBC WITH DIFFERENTIAL/PLATELET
Abs Immature Granulocytes: 0.01 10*3/uL (ref 0.00–0.07)
Basophils Absolute: 0 10*3/uL (ref 0.0–0.1)
Basophils Relative: 0 %
Eosinophils Absolute: 0.1 10*3/uL (ref 0.0–0.5)
Eosinophils Relative: 1 %
HCT: 38.7 % (ref 36.0–46.0)
Hemoglobin: 12.9 g/dL (ref 12.0–15.0)
Immature Granulocytes: 0 %
Lymphocytes Relative: 36 %
Lymphs Abs: 2.8 10*3/uL (ref 0.7–4.0)
MCH: 28.9 pg (ref 26.0–34.0)
MCHC: 33.3 g/dL (ref 30.0–36.0)
MCV: 86.6 fL (ref 80.0–100.0)
Monocytes Absolute: 0.5 10*3/uL (ref 0.1–1.0)
Monocytes Relative: 6 %
Neutro Abs: 4.5 10*3/uL (ref 1.7–7.7)
Neutrophils Relative %: 57 %
Platelets: 492 10*3/uL — ABNORMAL HIGH (ref 150–400)
RBC: 4.47 MIL/uL (ref 3.87–5.11)
RDW: 12.9 % (ref 11.5–15.5)
WBC: 7.9 10*3/uL (ref 4.0–10.5)
nRBC: 0 % (ref 0.0–0.2)

## 2021-08-16 LAB — I-STAT BETA HCG BLOOD, ED (MC, WL, AP ONLY): I-stat hCG, quantitative: <5 m[IU]/mL (ref <5)

## 2021-08-16 LAB — URINALYSIS, ROUTINE W REFLEX MICROSCOPIC
Bacteria, UA: NONE SEEN
Bilirubin Urine: NEGATIVE
Glucose, UA: >=500 mg/dL — AB
Hgb urine dipstick: NEGATIVE
Ketones, ur: 20 mg/dL — AB
Leukocytes,Ua: NEGATIVE
Nitrite: NEGATIVE
Protein, ur: >=300 mg/dL — AB
Specific Gravity, Urine: 1.031 — ABNORMAL HIGH (ref 1.005–1.030)
pH: 5 (ref 5.0–8.0)

## 2021-08-16 LAB — BETA-HYDROXYBUTYRIC ACID: Beta-Hydroxybutyric Acid: 0.31 mmol/L — ABNORMAL HIGH (ref 0.05–0.27)

## 2021-08-16 MED ORDER — LACTATED RINGERS IV BOLUS
1000.0000 mL | Freq: Once | INTRAVENOUS | Status: AC
  Administered 2021-08-16: 1000 mL via INTRAVENOUS

## 2021-08-16 MED ORDER — DROPERIDOL 2.5 MG/ML IJ SOLN
1.2500 mg | Freq: Once | INTRAMUSCULAR | Status: DC
Start: 2021-08-16 — End: 2021-08-16

## 2021-08-16 MED ORDER — PANTOPRAZOLE SODIUM 40 MG IV SOLR
40.0000 mg | Freq: Two times a day (BID) | INTRAVENOUS | Status: DC
  Administered 2021-08-17 – 2021-08-19 (×6): 40 mg via INTRAVENOUS
  Filled 2021-08-16 (×6): qty 10

## 2021-08-16 MED ORDER — PROCHLORPERAZINE EDISYLATE 10 MG/2ML IJ SOLN
10.0000 mg | Freq: Four times a day (QID) | INTRAMUSCULAR | Status: DC | PRN
Start: 2021-08-16 — End: 2021-08-19
  Administered 2021-08-17: 10 mg via INTRAVENOUS
  Filled 2021-08-16: qty 2

## 2021-08-16 MED ORDER — ACETAMINOPHEN 325 MG PO TABS
650.0000 mg | ORAL_TABLET | Freq: Four times a day (QID) | ORAL | Status: DC | PRN
  Administered 2021-08-18 – 2021-08-19 (×2): 650 mg via ORAL
  Filled 2021-08-16 (×2): qty 2

## 2021-08-16 MED ORDER — HYDROMORPHONE HCL 1 MG/ML IJ SOLN
0.5000 mg | INTRAMUSCULAR | Status: DC | PRN
  Administered 2021-08-16 – 2021-08-19 (×11): 0.5 mg via INTRAVENOUS
  Filled 2021-08-16: qty 0.5
  Filled 2021-08-16: qty 1
  Filled 2021-08-16 (×2): qty 0.5
  Filled 2021-08-16: qty 1
  Filled 2021-08-16 (×5): qty 0.5
  Filled 2021-08-16: qty 1
  Filled 2021-08-16: qty 0.5

## 2021-08-16 MED ORDER — SODIUM CHLORIDE 0.9 % IV SOLN
12.5000 mg | INTRAVENOUS | Status: AC
  Administered 2021-08-17: 12.5 mg via INTRAVENOUS
  Filled 2021-08-16: qty 12.5

## 2021-08-16 MED ORDER — HYDROMORPHONE HCL 1 MG/ML IJ SOLN
1.0000 mg | INTRAMUSCULAR | Status: AC
  Administered 2021-08-16: 1 mg via INTRAVENOUS

## 2021-08-16 MED ORDER — LACTATED RINGERS IV SOLN: INTRAVENOUS | Status: DC

## 2021-08-16 MED ORDER — PROCHLORPERAZINE EDISYLATE 10 MG/2ML IJ SOLN
10.0000 mg | INTRAMUSCULAR | Status: AC
  Administered 2021-08-16: 10 mg via INTRAVENOUS
  Filled 2021-08-16: qty 2

## 2021-08-16 MED ORDER — MELATONIN 5 MG PO TABS: 5.0000 mg | ORAL_TABLET | Freq: Every evening | ORAL | Status: DC | PRN

## 2021-08-16 MED ORDER — ENOXAPARIN SODIUM 40 MG/0.4ML IJ SOSY
40.0000 mg | PREFILLED_SYRINGE | Freq: Every day | INTRAMUSCULAR | Status: DC
  Administered 2021-08-17 – 2021-08-18 (×3): 40 mg via SUBCUTANEOUS
  Filled 2021-08-16 (×3): qty 0.4

## 2021-08-16 MED ORDER — HYDROXYZINE HCL 10 MG PO TABS: 10.0000 mg | ORAL_TABLET | Freq: Three times a day (TID) | ORAL | Status: DC | PRN

## 2021-08-16 MED ORDER — OXYCODONE HCL 5 MG PO TABS
5.0000 mg | ORAL_TABLET | Freq: Four times a day (QID) | ORAL | Status: DC | PRN
  Administered 2021-08-18: 5 mg via ORAL
  Filled 2021-08-16: qty 1

## 2021-08-16 MED ORDER — POLYETHYLENE GLYCOL 3350 17 G PO PACK: 17.0000 g | PACK | Freq: Every day | ORAL | Status: DC | PRN

## 2021-08-16 MED ORDER — HYDROMORPHONE HCL 1 MG/ML IJ SOLN
1.0000 mg | Freq: Once | INTRAMUSCULAR | Status: DC
  Filled 2021-08-16: qty 1

## 2021-08-16 MED ORDER — INSULIN ASPART 100 UNIT/ML IJ SOLN
0.0000 [IU] | Freq: Three times a day (TID) | INTRAMUSCULAR | Status: DC
  Administered 2021-08-17: 2 [IU] via SUBCUTANEOUS
  Administered 2021-08-17: 7 [IU] via SUBCUTANEOUS
  Administered 2021-08-17: 5 [IU] via SUBCUTANEOUS
  Filled 2021-08-16: qty 0.09

## 2021-08-16 MED ORDER — INSULIN ASPART 100 UNIT/ML IJ SOLN
0.0000 [IU] | Freq: Every day | INTRAMUSCULAR | Status: DC
  Administered 2021-08-17: 4 [IU] via SUBCUTANEOUS
  Administered 2021-08-18: 3 [IU] via SUBCUTANEOUS
  Filled 2021-08-16: qty 0.05

## 2021-08-16 NOTE — ED Notes (Signed)
This RN unable to obtain IV access. IV team consult placed.  

## 2021-08-16 NOTE — ED Provider Notes (Signed)
[facility] DEPT Provider Note   CSN: [id number] Arrival date & time: 08/16/21  1757     History  Chief Complaint  Patient presents with   Abdominal Pain    Kelli Hudson is a 31 y.o. female.  31 year old female presents with vomiting, nausea.  History of diabetic gastroparesis.  States this feels similar.  Has had diffuse abdominal cramping.  States that only thing that works for her is IV Dilaudid.       Home Medications Prior to Admission medications   Medication Sig Start Date End Date Taking? Authorizing Provider  albuterol (VENTOLIN HFA) 108 (90 Base) MCG/ACT inhaler Inhale 2 puffs into the lungs every 6 (six) hours as needed for shortness of breath.    [provider]  AMBULATORY NON FORMULARY MEDICATION Medication Name: Domperidone 10 mg: Take one tablet by mouth three times a day Patient not taking: Reported on 07/29/2021 07/09/21   Yetta Flock, MD  atorvastatin (LIPITOR) 20 MG tablet Take 1 tablet (20 mg total) by mouth daily. 04/10/21   [name], MD  blood glucose meter kit and supplies Dispense based on patient and insurance preference. Use up to four times daily as directed. (FOR ICD-10 E10.9, E11.9). 06/14/21   Shelly Coss, MD  blood glucose meter kit and supplies Dispense based on patient and insurance preference. Use up to four times daily as directed. (FOR ICD-10 E10.9, E11.9). 07/31/21   Georgette Shell, MD  carvedilol (COREG) 6.25 MG tablet Take 1 tablet (6.25 mg total) by mouth 2 (two) times daily with a meal. 07/01/21   Nita Sells, MD  Continuous Blood Gluc Sensor (DEXCOM G6 SENSOR) MISC 1 each by Does not apply route daily. 11/21/20   [name], MD  Continuous Blood Gluc Transmit (DEXCOM G6 TRANSMITTER) MISC 1 each by Does not apply route daily. 09/24/20   [name], MD  dicyclomine (BENTYL) 20 MG tablet Take 1 tablet (20 mg total) by mouth 3 (three) times daily as needed for  spasms. 07/31/21   Georgette Shell, MD  glucose blood (TRUE METRIX BLOOD GLUCOSE TEST) test strip Use as instructed Patient taking differently: 1 each by Other route See admin instructions. Use as instructed 09/03/20   [name], MD  hydrOXYzine (VISTARIL) 25 MG capsule Take 1 capsule (25 mg total) by mouth every 8 (eight) hours as needed. 07/31/21   Georgette Shell, MD  insulin detemir (LEVEMIR FLEXTOUCH) 100 UNIT/ML FlexPen Inject 16 Units into the skin daily. 06/14/21   Shelly Coss, MD  Insulin Pen Needle (PEN NEEDLES 3/16") 31G X 5 MM MISC 1 each by Does not apply route in the morning, at noon, and at bedtime. 06/14/21   Shelly Coss, MD  insulin regular (NOVOLIN R) 100 units/mL injection Inject 0-0.15 mLs (0-15 Units total) into the skin See admin instructions. Via pump.Start this medication after you follow up with your PCP Patient taking differently: Inject 0-15 Units into the skin See admin instructions. Via pump. 06/14/21   Shelly Coss, MD  metoCLOPramide (REGLAN) 10 MG tablet Take 1 tablet (10 mg total) by mouth every 8 (eight) hours as needed for nausea. 07/31/21 08/30/21  Georgette Shell, MD  pantoprazole (PROTONIX) 40 MG tablet Take 1 tablet (40 mg total) by mouth 2 (two) times daily before a meal. 04/24/21   Aline August, MD  promethazine (PHENERGAN) 12.5 MG tablet Take 1 tablet (12.5 mg total) by mouth every 6 (six) hours as needed for  nausea or vomiting. 07/31/21   Georgette Shell, MD  TRUEPLUS LANCETS 28G MISC assist with checking blood sugar TID and qhs Patient taking differently: 1 each by Other route See admin instructions. assist with checking blood sugar TID and qhs 09/10/15   Zettie Pho S, PA-C  insulin aspart (NOVOLOG) 100 UNIT/ML FlexPen Inject 5 Units into the skin 3 (three) times daily with meals. 02/23/18 07/05/19  Donne Hazel, MD      Allergies    Patient has no known allergies.    Review of Systems   Review of Systems  All other  systems reviewed and are negative.   Physical Exam Updated Vital Signs BP 115/88 (BP Location: Left Arm)   Pulse (!) 130   Temp 99.6 F (37.6 C) (Oral)   Resp 20   Wt 78.6 kg   SpO2 99%   BMI 26.35 kg/m  Physical Exam Vitals and nursing note reviewed.  Constitutional:      General: She is not in acute distress.    Appearance: Normal appearance. She is well-developed. She is not toxic-appearing.  HENT:     Head: Normocephalic and atraumatic.  Eyes:     General: Lids are normal.     Conjunctiva/sclera: Conjunctivae normal.     Pupils: Pupils are equal, round, and reactive to light.  Neck:     Thyroid: No thyroid mass.     Trachea: No tracheal deviation.  Cardiovascular:     Rate and Rhythm: Regular rhythm. Tachycardia present.     Heart sounds: Normal heart sounds. No murmur heard.    No gallop.  Pulmonary:     Effort: Pulmonary effort is normal. No respiratory distress.     Breath sounds: Normal breath sounds. No stridor. No decreased breath sounds, wheezing, rhonchi or rales.  Abdominal:     General: There is no distension.     Tenderness: There is no abdominal tenderness.  Musculoskeletal:        General: No tenderness. Normal range of motion.     Cervical back: Normal range of motion and neck supple.  Skin:    General: Skin is warm and dry.     Findings: No abrasion or rash.  Neurological:     Mental Status: She is alert and oriented to person, place, and time. Mental status is at baseline.     GCS: GCS eye subscore is 4. GCS verbal subscore is 5. GCS motor subscore is 6.     Cranial Nerves: No cranial nerve deficit.     Sensory: No sensory deficit.     Motor: Motor function is intact.  Psychiatric:        Mood and Affect: Mood is anxious.        Speech: Speech normal.        Behavior: Behavior normal.     ED Results / Procedures / Treatments   Labs (all labs ordered are listed, but only abnormal results are displayed) Labs Reviewed  CBC WITH  DIFFERENTIAL/PLATELET - Abnormal; Notable for the following components:      Result Value   Platelets 492 (*)    All other components within normal limits  COMPREHENSIVE METABOLIC PANEL - Abnormal; Notable for the following components:   Glucose, Bld 155 (*)    BUN 23 (*)    Creatinine, Ser 1.34 (*)    Albumin 3.4 (*)    GFR, Estimated 55 (*)    All other components within normal limits  BETA-HYDROXYBUTYRIC ACID - Abnormal;  Notable for the following components:   Beta-Hydroxybutyric Acid 0.31 (*)    All other components within normal limits  URINALYSIS, ROUTINE W REFLEX MICROSCOPIC  RAPID URINE DRUG SCREEN, HOSP PERFORMED  I-STAT BETA HCG BLOOD, ED ([facility], [facility], [facility] ONLY)    EKG None  Radiology No results found.  Procedures Procedures    Medications Ordered in ED Medications  lactated ringers bolus 1,000 mL (has no administration in time range)  lactated ringers infusion (has no administration in time range)  HYDROmorphone (DILAUDID) injection 1 mg (has no administration in time range)  prochlorperazine (COMPAZINE) injection 10 mg (has no administration in time range)    ED Course/ Medical Decision Making/ A&P                           Medical Decision Making Amount and/or Complexity of Data Reviewed Labs: ordered.  Risk Prescription drug management.   Patient tachycardic here.  Appears to be in distress.  Medicated with Compazine as well as 1 mg of Dilaudid.  Review of her old record shows that when she gets admitted she is treated with Compazine and Zofran.  She has no evidence of DKA here.  She has no increased anion gap.  States that when she gets this way she has to be admitted.  Will consult hospitalist for admission        Final Clinical Impression(s) / ED Diagnoses Final diagnoses:  None    Rx / DC Orders ED Discharge Orders     None         Lacretia Leigh, MD 08/16/21 2151

## 2021-08-16 NOTE — ED Notes (Signed)
Lab advises that there was not enough urine collected for urine drug screen and will need a recollect.

## 2021-08-16 NOTE — H&P (Incomplete)
History and Physical  Kelli Hudson IOM:[id number] DOB: 05-Mar-1990 DOA: 08/16/2021  Referring physician: Dr. Zenia Resides, EDP  PCP: [name], MD  Outpatient Specialists: GI. Patient coming from: Home.  Chief Complaint: Sudden onset nausea vomiting and abdominal pain  HPI: Kelli Hudson is a 31 y.o. female with medical history significant for type 1 diabetes, gastroparesis, gastritis, GERD, hypertension, hyperlipidemia who presented to [facility] ED with complaints of sudden onset nausea vomiting and abdominal pain this morning.  No change in diet, medication or in lifestyle.    Upon presentation to the ED, initial work-up is unrevealing.  Due to concern for possible gastritis, IV Protonix twice daily was initiated.  Additionally, received IV analgesics, IV antiemetics, and IV fluids in the ED.  TR[facility], hospitalist service, was asked to admit for further evaluation and management.  ED Course: Tmax 99.6.  WBC uptrending 15.2 from 7.9.  Serum bicarb 19 from 27.  Review of Systems: Review of systems as noted in the HPI. All other systems reviewed and are negative.   Past Medical History:  Diagnosis Date   Acute H. pylori gastric ulcer    Diabetes mellitus ([place])    Diabetic gastroparesis ([place])    Gastroparesis    GERD (gastroesophageal reflux disease)    Hypertension    Hyperthyroidism    Past Surgical History:  Procedure Laterality Date   AMPUTATION TOE Left 03/10/2018   Procedure: AMPUTATION FIFTH TOE;  Surgeon: Trula Slade, DPM;  Location: [place];  Service: Podiatry;  Laterality: Left;   BIOPSY  01/28/2020   Procedure: BIOPSY;  Surgeon: Otis Brace, MD;  Location: [facility];  Service: Gastroenterology;;   ESOPHAGOGASTRODUODENOSCOPY N/A 01/28/2020   Procedure: ESOPHAGOGASTRODUODENOSCOPY (EGD);  Surgeon: Otis Brace, MD;  Location: [facility];  Service: Gastroenterology;  Laterality: N/A;   ESOPHAGOGASTRODUODENOSCOPY (EGD) WITH  PROPOFOL Left 09/08/2015   Procedure: ESOPHAGOGASTRODUODENOSCOPY (EGD) WITH PROPOFOL;  Surgeon: Arta Silence, MD;  Location: [facility] ENDOSCOPY;  Service: Endoscopy;  Laterality: Left;   WISDOM TOOTH EXTRACTION      Social History:  reports that she has never smoked. She has never used smokeless tobacco. She reports current alcohol use of about 1.0 standard drink of alcohol per week. She reports that she does not use drugs.   No Known Allergies  Family History  Problem Relation Age of Onset   Lung cancer Mother    Diabetes Mother    Bipolar disorder Father    Liver cancer Maternal Grandfather    Diabetes Maternal Aunt        x 2   Pancreatic cancer Maternal Uncle    Prostate cancer Maternal Uncle    Breast cancer Other        maternal great aunt   Heart disease Other    Ovarian cancer Other        maternal great aunt   Kidney disease Other        maternal great aunt   Colon cancer Neg Hx    Stomach cancer Neg Hx       Prior to Admission medications   Medication Sig Start Date End Date Taking? Authorizing Provider  albuterol (VENTOLIN HFA) 108 (90 Base) MCG/ACT inhaler Inhale 2 puffs into the lungs every 6 (six) hours as needed for shortness of breath.    [provider]  AMBULATORY NON FORMULARY MEDICATION Medication Name: Domperidone 10 mg: Take one tablet by mouth three times a day Patient not taking: Reported on 07/29/2021 07/09/21   Armbruster, Carlota Raspberry, MD  atorvastatin (LIPITOR) 20 MG tablet Take 1 tablet (20 mg total) by mouth daily. 04/10/21   [name], MD  blood glucose meter kit and supplies Dispense based on patient and insurance preference. Use up to four times daily as directed. (FOR ICD-10 E10.9, E11.9). 06/14/21   Shelly Coss, MD  blood glucose meter kit and supplies Dispense based on patient and insurance preference. Use up to four times daily as directed. (FOR ICD-10 E10.9, E11.9). 07/31/21   Georgette Shell, MD  carvedilol (COREG) 6.25 MG  tablet Take 1 tablet (6.25 mg total) by mouth 2 (two) times daily with a meal. 07/01/21   Nita Sells, MD  Continuous Blood Gluc Sensor (DEXCOM G6 SENSOR) MISC 1 each by Does not apply route daily. 11/21/20   [name], MD  Continuous Blood Gluc Transmit (DEXCOM G6 TRANSMITTER) MISC 1 each by Does not apply route daily. 09/24/20   [name], MD  dicyclomine (BENTYL) 20 MG tablet Take 1 tablet (20 mg total) by mouth 3 (three) times daily as needed for spasms. 07/31/21   Georgette Shell, MD  glucose blood (TRUE METRIX BLOOD GLUCOSE TEST) test strip Use as instructed Patient taking differently: 1 each by Other route See admin instructions. Use as instructed 09/03/20   [name], MD  hydrOXYzine (VISTARIL) 25 MG capsule Take 1 capsule (25 mg total) by mouth every 8 (eight) hours as needed. 07/31/21   Georgette Shell, MD  insulin detemir (LEVEMIR FLEXTOUCH) 100 UNIT/ML FlexPen Inject 16 Units into the skin daily. 06/14/21   Shelly Coss, MD  Insulin Pen Needle (PEN NEEDLES 3/16") 31G X 5 MM MISC 1 each by Does not apply route in the morning, at noon, and at bedtime. 06/14/21   Shelly Coss, MD  insulin regular (NOVOLIN R) 100 units/mL injection Inject 0-0.15 mLs (0-15 Units total) into the skin See admin instructions. Via pump.Start this medication after you follow up with your PCP Patient taking differently: Inject 0-15 Units into the skin See admin instructions. Via pump. 06/14/21   Shelly Coss, MD  metoCLOPramide (REGLAN) 10 MG tablet Take 1 tablet (10 mg total) by mouth every 8 (eight) hours as needed for nausea. 07/31/21 08/30/21  Georgette Shell, MD  pantoprazole (PROTONIX) 40 MG tablet Take 1 tablet (40 mg total) by mouth 2 (two) times daily before a meal. 04/24/21   Aline August, MD  promethazine (PHENERGAN) 12.5 MG tablet Take 1 tablet (12.5 mg total) by mouth every 6 (six) hours as needed for nausea or vomiting. 07/31/21   Georgette Shell, MD  TRUEPLUS  LANCETS 28G MISC assist with checking blood sugar TID and qhs Patient taking differently: 1 each by Other route See admin instructions. assist with checking blood sugar TID and qhs 09/10/15   Zettie Pho S, PA-C  insulin aspart (NOVOLOG) 100 UNIT/ML FlexPen Inject 5 Units into the skin 3 (three) times daily with meals. 02/23/18 07/05/19  Donne Hazel, MD    Physical Exam: BP 115/88 (BP Location: Left Arm)   Pulse (!) 130   Temp 99.6 F (37.6 C) (Oral)   Resp 20   Wt 78.6 kg   SpO2 99%   BMI 26.35 kg/m   General: 31 y.o. year-old female well developed well nourished in no acute distress.  Alert and oriented x3.  Uncomfortable due to diffuse abdominal pain, nausea and vomiting. Cardiovascular: Regular rate and rhythm with no rubs or gallops.  No thyromegaly or JVD noted.  No  lower extremity edema. 2/4 pulses in all 4 extremities. Respiratory: Clear to auscultation with no wheezes or rales. Good inspiratory effort. Abdomen: Diffuse tenderness with mild palpation.  Hypoactive bowel sounds. Muskuloskeletal: No cyanosis, clubbing or edema noted bilaterally Neuro: CN II-XII intact, strength, sensation, reflexes Skin: No ulcerative lesions noted or rashes Psychiatry: Judgement and insight appear normal. Mood is appropriate for condition and setting          Labs on Admission:  Basic Metabolic Panel: Recent Labs  Lab 08/16/21 1836  NA 139  K 4.2  CL 102  CO2 27  GLUCOSE 155*  BUN 23*  CREATININE 1.34*  CALCIUM 10.1   Liver Function Tests: Recent Labs  Lab 08/16/21 1836  AST 19  ALT 21  ALKPHOS 89  BILITOT 0.8  PROT 8.0  ALBUMIN 3.4*   No results for input(s): "LIPASE", "AMYLASE" in the last 168 hours. No results for input(s): "AMMONIA" in the last 168 hours. CBC: Recent Labs  Lab 08/16/21 1836  WBC 7.9  NEUTROABS 4.5  HGB 12.9  HCT 38.7  MCV 86.6  PLT 492*   Cardiac Enzymes: No results for input(s): "CKTOTAL", "CKMB", "CKMBINDEX", "TROPONINI" in the last 168  hours.  BNP (last 3 results) No results for input(s): "BNP" in the last 8760 hours.  ProBNP (last 3 results) No results for input(s): "PROBNP" in the last 8760 hours.  CBG: No results for input(s): "GLUCAP" in the last 168 hours.  Radiological Exams on Admission: No results found.  EKG: I independently viewed the EKG done and my findings are as followed: Sinus tachycardia rate of 125, nonspecific ST-T changes.  QTc 652.  Assessment/Plan Present on Admission:  Intractable nausea and vomiting  Active Problems:   Intractable nausea and vomiting  Intractable nausea and vomiting and abdominal pain Unclear etiology with sudden onset IV fluid IV antiemetics as needed IV analgesics as needed Contrast enhanced CT abdomen pelvis  Type 1 diabetes with concern for developing DKA Lantus 5 units daily Insulin sliding scale UA positive for ketonuria Serum glucose greater than 300 Serum bicarb 19, anion gap 14 Repeat BMP, if worsening consider insulin drip  Non anion gap metabolic acidosis Serum bicarb 19, anion gap 14 Continue IV fluid and insulin Repeat BMP  Leukocytosis Possibly reactive, WBC 15.2 from 7.9. No clear evidence of active infective process Follow-up CT abdomen pelvis with contrast. UA negative for pyuria  Prolonged Qtc Admission twelve-lead EKG with QTc greater than 650 Avoid QTc prolonging agents Optimize magnesium and potassium levels Repeat twelve-lead EKG in the morning  GERD with history of gastritis IV PPI twice daily  Essential hypertension On Coreg prior to admission Closely monitor vital signs   DVT prophylaxis: Subcu Lovenox daily  Code Status: Full code  Family Communication: Boyfriend at bedside  Disposition Plan: Admitted to telemetry medical unit  Consults called: None.  Admission status: Inpatient status.   Status is: Inpatient The patient requires at least 2 midnights for further evaluation and treatment of present  condition.   Kayleen Memos MD [facility] Pager [phone]  If 7PM-7AM, please contact night-coverage [email or web address] Password [email or web address]  08/16/2021, 10:11 PM

## 2021-08-16 NOTE — ED Provider Triage Note (Signed)
Emergency Medicine Provider Triage Evaluation Note  Kelli Hudson , a 31 y.o. female  was evaluated in triage.  Pt complains of abdominal pain, nausea and vomiting.  She has a history of type 1 diabetes and gastroparesis, multiple hospital admissions.  She reports today that her symptoms recurred, starting this morning.  She describes epigastric abdominal pain with persistent vomiting.  Symptoms are consistent with previous episodes of gastroparesis.  States that her sugars have been "okay".  She has taken home nausea medicine, cannot remember the name, without improvement.  Review of Systems  Positive: Vomiting, abdominal pain Negative: Fever  Physical Exam  BP 113/84 (BP Location: Left Arm)   Pulse (!) 125   Temp 99.3 F (37.4 C) (Oral)   Resp 18   SpO2 100%  Gen:   Awake, appears uncomfortable Resp:  Normal effort  MSK:   Moves extremities without difficulty  Other:  Epigastric abdominal tenderness without rebound or guarding, tachycardia  Medical Decision Making  Medically screening exam initiated at 6:05 PM.  Appropriate orders placed.  Shellene Sweigert was informed that the remainder of the evaluation will be completed by another provider, this initial triage assessment does not replace that evaluation, and the importance of remaining in the ED until their evaluation is complete.     Renne Crigler, PA-C 08/16/21 1823

## 2021-08-16 NOTE — ED Notes (Signed)
Pt ambulatory without assistance.  

## 2021-08-16 NOTE — ED Triage Notes (Signed)
Pt arrived via POV, c/o abd pain and vomiting. Hx of gastroparesis.

## 2021-08-17 ENCOUNTER — Other Ambulatory Visit: Payer: Self-pay

## 2021-08-17 ENCOUNTER — Inpatient Hospital Stay (HOSPITAL_COMMUNITY): Payer: 59

## 2021-08-17 DIAGNOSIS — E1159 Type 2 diabetes mellitus with other circulatory complications: Secondary | ICD-10-CM

## 2021-08-17 DIAGNOSIS — E1043 Type 1 diabetes mellitus with diabetic autonomic (poly)neuropathy: Secondary | ICD-10-CM | POA: Diagnosis not present

## 2021-08-17 DIAGNOSIS — E1042 Type 1 diabetes mellitus with diabetic polyneuropathy: Secondary | ICD-10-CM

## 2021-08-17 DIAGNOSIS — R112 Nausea with vomiting, unspecified: Secondary | ICD-10-CM | POA: Diagnosis not present

## 2021-08-17 DIAGNOSIS — K3184 Gastroparesis: Secondary | ICD-10-CM

## 2021-08-17 DIAGNOSIS — I152 Hypertension secondary to endocrine disorders: Secondary | ICD-10-CM

## 2021-08-17 LAB — BASIC METABOLIC PANEL
Anion gap: 14 (ref 5–15)
Anion gap: 12 (ref 5–15)
BUN: 26 mg/dL — ABNORMAL HIGH (ref 6–20)
BUN: 28 mg/dL — ABNORMAL HIGH (ref 6–20)
CO2: 19 mmol/L — ABNORMAL LOW (ref 22–32)
CO2: 23 mmol/L (ref 22–32)
Calcium: 9.4 mg/dL (ref 8.9–10.3)
Calcium: 9.3 mg/dL (ref 8.9–10.3)
Chloride: 104 mmol/L (ref 98–111)
Chloride: 102 mmol/L (ref 98–111)
Creatinine, Ser: 1.11 mg/dL — ABNORMAL HIGH (ref 0.44–1.00)
Creatinine, Ser: 1.54 mg/dL — ABNORMAL HIGH (ref 0.44–1.00)
GFR, Estimated: >60 mL/min (ref >60)
GFR, Estimated: 46 mL/min — ABNORMAL LOW (ref >60)
Glucose, Bld: 323 mg/dL — ABNORMAL HIGH (ref 70–99)
Glucose, Bld: 347 mg/dL — ABNORMAL HIGH (ref 70–99)
Potassium: 4.6 mmol/L (ref 3.5–5.1)
Potassium: 4.9 mmol/L (ref 3.5–5.1)
Sodium: 137 mmol/L (ref 135–145)
Sodium: 137 mmol/L (ref 135–145)

## 2021-08-17 LAB — CBC
HCT: 38.8 % (ref 36.0–46.0)
Hemoglobin: 12.5 g/dL (ref 12.0–15.0)
MCH: 28.7 pg (ref 26.0–34.0)
MCHC: 32.2 g/dL (ref 30.0–36.0)
MCV: 89 fL (ref 80.0–100.0)
Platelets: 424 10*3/uL — ABNORMAL HIGH (ref 150–400)
RBC: 4.36 MIL/uL (ref 3.87–5.11)
RDW: 13 % (ref 11.5–15.5)
WBC: 15.2 10*3/uL — ABNORMAL HIGH (ref 4.0–10.5)
nRBC: 0 % (ref 0.0–0.2)

## 2021-08-17 LAB — PHOSPHORUS: Phosphorus: 5.2 mg/dL — ABNORMAL HIGH (ref 2.5–4.6)

## 2021-08-17 LAB — CBG MONITORING, ED
Glucose-Capillary: 260 mg/dL — ABNORMAL HIGH (ref 70–99)
Glucose-Capillary: 322 mg/dL — ABNORMAL HIGH (ref 70–99)
Glucose-Capillary: 340 mg/dL — ABNORMAL HIGH (ref 70–99)
Glucose-Capillary: 350 mg/dL — ABNORMAL HIGH (ref 70–99)

## 2021-08-17 LAB — GLUCOSE, CAPILLARY
Glucose-Capillary: 197 mg/dL — ABNORMAL HIGH (ref 70–99)
Glucose-Capillary: 185 mg/dL — ABNORMAL HIGH (ref 70–99)

## 2021-08-17 LAB — MAGNESIUM: Magnesium: 1.8 mg/dL (ref 1.7–2.4)

## 2021-08-17 MED ORDER — METOPROLOL TARTRATE 5 MG/5ML IV SOLN
5.0000 mg | INTRAVENOUS | Status: AC
  Administered 2021-08-17: 5 mg via INTRAVENOUS
  Filled 2021-08-17: qty 5

## 2021-08-17 MED ORDER — IOHEXOL 300 MG/ML  SOLN
100.0000 mL | Freq: Once | INTRAMUSCULAR | Status: AC | PRN
  Administered 2021-08-17: 100 mL via INTRAVENOUS

## 2021-08-17 MED ORDER — SODIUM CHLORIDE 0.9 % IV BOLUS
500.0000 mL | Freq: Once | INTRAVENOUS | Status: AC
  Administered 2021-08-17: 500 mL via INTRAVENOUS

## 2021-08-17 MED ORDER — LACTATED RINGERS IV BOLUS
500.0000 mL | Freq: Once | INTRAVENOUS | Status: AC
  Administered 2021-08-17: 500 mL via INTRAVENOUS

## 2021-08-17 MED ORDER — METOCLOPRAMIDE HCL 5 MG/ML IJ SOLN
5.0000 mg | Freq: Three times a day (TID) | INTRAMUSCULAR | Status: DC
  Administered 2021-08-17 – 2021-08-18 (×5): 5 mg via INTRAVENOUS
  Filled 2021-08-17 (×7): qty 2

## 2021-08-17 MED ORDER — INSULIN GLARGINE-YFGN 100 UNIT/ML ~~LOC~~ SOLN
5.0000 [IU] | Freq: Every day | SUBCUTANEOUS | Status: DC
  Administered 2021-08-17: 5 [IU] via SUBCUTANEOUS
  Filled 2021-08-17 (×2): qty 0.05

## 2021-08-17 MED ORDER — CARVEDILOL 3.125 MG PO TABS: 6.2500 mg | ORAL_TABLET | Freq: Two times a day (BID) | ORAL | Status: DC

## 2021-08-17 MED ORDER — CARVEDILOL 3.125 MG PO TABS
3.1250 mg | ORAL_TABLET | Freq: Two times a day (BID) | ORAL | Status: DC
  Administered 2021-08-17 – 2021-08-19 (×4): 3.125 mg via ORAL
  Filled 2021-08-17 (×4): qty 1

## 2021-08-17 NOTE — Plan of Care (Signed)
  Problem: Activity: Goal: Risk for activity intolerance will decrease 08/17/2021 2337 by Virgilio Belling, RN Outcome: Progressing 08/17/2021 2336 by Virgilio Belling, RN Outcome: Progressing   Problem: Pain Managment: Goal: General experience of comfort will improve Outcome: Progressing   Problem: Safety: Goal: Ability to remain free from injury will improve 08/17/2021 2337 by Virgilio Belling, RN Outcome: Progressing 08/17/2021 2336 by Virgilio Belling, RN Outcome: Progressing

## 2021-08-17 NOTE — Hospital Course (Signed)
Kelli Hudson is a 31 y.o. F with T1DM, gastroparesis, HTN who presented with abdominal pain and vomiting.  In the ER, CT showed gastroenteritis and diffuse colitis vs nondistension.

## 2021-08-17 NOTE — Assessment & Plan Note (Addendum)
Glucoses elevated still, still no evidence of acidosis - Continue glargine, increase dose - Continue SS correction insulin, increase dose - Continue IV fluids

## 2021-08-17 NOTE — Assessment & Plan Note (Signed)
See above

## 2021-08-17 NOTE — Assessment & Plan Note (Signed)
Unable to take more than a few sips this morning - Continue IVF - Advance diet as tolerated - Continue compazine PRN - Start Reglan

## 2021-08-17 NOTE — Assessment & Plan Note (Signed)
BP normal - Continue Coreg

## 2021-08-17 NOTE — Progress Notes (Addendum)
  Progress Note   Patient: Kelli Hudson UMP:[id number] DOB: 04/17/1990 DOA: 08/16/2021     1 DOS: the patient was seen and examined on 08/17/2021 at 9:50AM      Brief hospital course: Mrs. Kelli Hudson is a 31 y.o. F with T1DM, gastroparesis, HTN who presented with abdominal pain and vomiting.  In the ER, CT showed gastroenteritis and diffuse colitis vs nondistension.     Assessment and Plan: * Intractable nausea and vomiting Unable to take more than a few sips this morning - Continue IVF - Advance diet as tolerated - Continue compazine PRN - Start Reglan  Type 1 diabetes mellitus with diabetic polyneuropathy (HCC) Glucoses elevated, no evidence of acidosis - Continue glargine - Continue SS correction insulin - Continue IV fluids  Diabetic gastroparesis associated with type 1 diabetes mellitus (HCC) See above  Hypertension associated with diabetes (HCC) BP labile here, hard to know if readings are accurate, she is asymptomatic - Continue Coreg  Gastroparesis See above          Subjective: The patient still has frequent vomiting, a lot of abdominal pain, weakness.  She is not confused, has no chest pain or dyspnea.     Physical Exam: Vitals:   08/17/21 0758 08/17/21 0830 08/17/21 1000 08/17/21 1030  BP: 103/64 (!) 141/90 100/67 111/68  Pulse: (!) 107 (!) 114 (!) 113 (!) 114  Resp: (!) 9 14 12 10   Temp:      TempSrc:      SpO2: 98% 97% 94% 99%  Weight:       Well-nourished adult female, lying in bed, curled up on her right side, appears uncomfortable Heart tachycardic, no murmurs, no peripheral edema Lungs clear without rales or wheezes Abdomen with diffuse tenderness, a lot of guarding, no distention, bowel sounds normal Mentation normal, responding appropriate to questions, moves all extremities with normal strength and coordination, speech fluent  Data Reviewed: Basic metabolic panel shows creatinine up to 1.5, CBC and glucoses reviewed.  White  blood cell count elevated, glucose elevated.  Family Communication: None present at the bedside    Disposition: Status is: Inpatient The patient was admitted with diabetic gastroparesis.  She is still unable to take adequate oral intake to meet fluid and nutrition needs, she is requiring frequent dosing of IV analgesics and antiemetics and she remains tachycardic to the 110s.  We will continue IV fluids, IV antiemetics, and monitor for improvement         Author: , MD 08/17/2021 10:46 AM  For on call review [email or web address].

## 2021-08-18 DIAGNOSIS — K3184 Gastroparesis: Secondary | ICD-10-CM | POA: Diagnosis not present

## 2021-08-18 DIAGNOSIS — E1159 Type 2 diabetes mellitus with other circulatory complications: Secondary | ICD-10-CM | POA: Diagnosis not present

## 2021-08-18 DIAGNOSIS — E1043 Type 1 diabetes mellitus with diabetic autonomic (poly)neuropathy: Secondary | ICD-10-CM | POA: Diagnosis not present

## 2021-08-18 DIAGNOSIS — R112 Nausea with vomiting, unspecified: Secondary | ICD-10-CM | POA: Diagnosis not present

## 2021-08-18 LAB — BASIC METABOLIC PANEL
Anion gap: 8 (ref 5–15)
BUN: 20 mg/dL (ref 6–20)
CO2: 23 mmol/L (ref 22–32)
Calcium: 9 mg/dL (ref 8.9–10.3)
Chloride: 104 mmol/L (ref 98–111)
Creatinine, Ser: 1.1 mg/dL — ABNORMAL HIGH (ref 0.44–1.00)
GFR, Estimated: >60 mL/min (ref >60)
Glucose, Bld: 257 mg/dL — ABNORMAL HIGH (ref 70–99)
Potassium: 4.3 mmol/L (ref 3.5–5.1)
Sodium: 135 mmol/L (ref 135–145)

## 2021-08-18 LAB — GLUCOSE, CAPILLARY
Glucose-Capillary: 298 mg/dL — ABNORMAL HIGH (ref 70–99)
Glucose-Capillary: 174 mg/dL — ABNORMAL HIGH (ref 70–99)
Glucose-Capillary: 222 mg/dL — ABNORMAL HIGH (ref 70–99)
Glucose-Capillary: 288 mg/dL — ABNORMAL HIGH (ref 70–99)

## 2021-08-18 MED ORDER — INSULIN GLARGINE-YFGN 100 UNIT/ML ~~LOC~~ SOLN
8.0000 [IU] | Freq: Every day | SUBCUTANEOUS | Status: DC
  Administered 2021-08-18: 8 [IU] via SUBCUTANEOUS
  Filled 2021-08-18 (×2): qty 0.08

## 2021-08-18 MED ORDER — OXYCODONE HCL 5 MG PO TABS
5.0000 mg | ORAL_TABLET | ORAL | Status: DC | PRN
  Administered 2021-08-18 – 2021-08-19 (×4): 5 mg via ORAL
  Filled 2021-08-18 (×4): qty 1

## 2021-08-18 MED ORDER — INSULIN ASPART 100 UNIT/ML IJ SOLN
0.0000 [IU] | Freq: Three times a day (TID) | INTRAMUSCULAR | Status: DC
  Administered 2021-08-18: 8 [IU] via SUBCUTANEOUS
  Administered 2021-08-18: 3 [IU] via SUBCUTANEOUS
  Administered 2021-08-18: 5 [IU] via SUBCUTANEOUS

## 2021-08-18 NOTE — Progress Notes (Addendum)
  Progress Note   Patient: Kelli Hudson IZT:[id number] DOB: 02-04-1991 DOA: 08/16/2021     2 DOS: the patient was seen and examined on 08/18/2021 at 8:45AM      Brief hospital course: Kelli Hudson is a 31 y.o. F with T1DM, gastroparesis, HTN who presented with abdominal pain and vomiting.  In the ER, CT showed gastroenteritis and diffuse colitis vs nondistension.     Assessment and Plan: * Intractable nausea and vomiting Still severe symptoms, limited PO intake - Continue IVF - Continue Reglan and antiemetics - Minimize opiates - Advance diet as tolerated   Type 1 diabetes mellitus with diabetic polyneuropathy (HCC) Glucoses elevated still, still no evidence of acidosis - Continue glargine, increase dose - Continue SS correction insulin, increase dose - Continue IV fluids  Diabetic gastroparesis associated with type 1 diabetes mellitus (HCC) See above  Hypertension associated with diabetes (HCC) BP normal - Continue Coreg  Gastroparesis See above          Subjective: Still with severe abdominal pain, no fever, no confusion.  Still nauseated.     Physical Exam: Vitals:   08/18/21 0129 08/18/21 0506 08/18/21 0959 08/18/21 1315  BP: 137/83 (!) 139/92 139/90 (!) 101/51  Pulse: (!) 108 (!) 107 (!) 105 (!) 110  Resp: 18 18 14 16   Temp: 98 F (36.7 C) 98.2 F (36.8 C) 98.2 F (36.8 C) 99.4 F (37.4 C)  TempSrc: Oral Oral Oral Oral  SpO2: 99% 99% 100% 100%  Weight:      Height:       Adult female, lying in bed, appears weak and tired. RRR no murmurs, no LE edema Respiratory rate normal, lungs clear without rales or wheezes. Abdomen diffusely tender, voluntary guarding noted. Attention normal, affect normal, speech fluent, moves all extremities normally.   Data Reviewed: BMP reviewed and normal, CBC normal. Requires frequent dosing of IV opiates.      Disposition: Status is: Inpatient         Author: ,  MD 08/18/2021 1:47 PM  For on call review [email or web address].

## 2021-08-19 DIAGNOSIS — E1159 Type 2 diabetes mellitus with other circulatory complications: Secondary | ICD-10-CM | POA: Diagnosis not present

## 2021-08-19 DIAGNOSIS — E1043 Type 1 diabetes mellitus with diabetic autonomic (poly)neuropathy: Secondary | ICD-10-CM | POA: Diagnosis not present

## 2021-08-19 DIAGNOSIS — K3184 Gastroparesis: Secondary | ICD-10-CM | POA: Diagnosis not present

## 2021-08-19 DIAGNOSIS — R112 Nausea with vomiting, unspecified: Secondary | ICD-10-CM | POA: Diagnosis not present

## 2021-08-19 LAB — CBC
HCT: 32.1 % — ABNORMAL LOW (ref 36.0–46.0)
Hemoglobin: 10.6 g/dL — ABNORMAL LOW (ref 12.0–15.0)
MCH: 29 pg (ref 26.0–34.0)
MCHC: 33 g/dL (ref 30.0–36.0)
MCV: 87.9 fL (ref 80.0–100.0)
Platelets: 356 10*3/uL (ref 150–400)
RBC: 3.65 MIL/uL — ABNORMAL LOW (ref 3.87–5.11)
RDW: 12.8 % (ref 11.5–15.5)
WBC: 8.9 10*3/uL (ref 4.0–10.5)
nRBC: 0 % (ref 0.0–0.2)

## 2021-08-19 LAB — COMPREHENSIVE METABOLIC PANEL
ALT: 17 U/L (ref 0–44)
AST: 18 U/L (ref 15–41)
Albumin: 2.8 g/dL — ABNORMAL LOW (ref 3.5–5.0)
Alkaline Phosphatase: 82 U/L (ref 38–126)
Anion gap: 7 (ref 5–15)
BUN: 17 mg/dL (ref 6–20)
CO2: 27 mmol/L (ref 22–32)
Calcium: 9.2 mg/dL (ref 8.9–10.3)
Chloride: 102 mmol/L (ref 98–111)
Creatinine, Ser: 0.98 mg/dL (ref 0.44–1.00)
GFR, Estimated: >60 mL/min (ref >60)
Glucose, Bld: 299 mg/dL — ABNORMAL HIGH (ref 70–99)
Potassium: 4.1 mmol/L (ref 3.5–5.1)
Sodium: 136 mmol/L (ref 135–145)
Total Bilirubin: 0.7 mg/dL (ref 0.3–1.2)
Total Protein: 6.2 g/dL — ABNORMAL LOW (ref 6.5–8.1)

## 2021-08-19 LAB — GLUCOSE, CAPILLARY
Glucose-Capillary: 295 mg/dL — ABNORMAL HIGH (ref 70–99)
Glucose-Capillary: 273 mg/dL — ABNORMAL HIGH (ref 70–99)

## 2021-08-19 MED ORDER — INSULIN GLARGINE-YFGN 100 UNIT/ML ~~LOC~~ SOLN
12.0000 [IU] | Freq: Every day | SUBCUTANEOUS | Status: DC
  Administered 2021-08-19: 12 [IU] via SUBCUTANEOUS
  Filled 2021-08-19: qty 0.12

## 2021-08-19 MED ORDER — METOCLOPRAMIDE HCL 10 MG PO TABS: 10.0000 mg | ORAL_TABLET | Freq: Three times a day (TID) | ORAL | 1 refills | Status: DC | PRN

## 2021-08-19 MED ORDER — INSULIN ASPART 100 UNIT/ML IJ SOLN: 0.0000 [IU] | Freq: Three times a day (TID) | INTRAMUSCULAR | Status: DC

## 2021-08-19 MED ORDER — PROCHLORPERAZINE MALEATE 10 MG PO TABS: 5.0000 mg | ORAL_TABLET | Freq: Three times a day (TID) | ORAL | 1 refills | Status: DC | PRN

## 2021-08-19 MED ORDER — OXYCODONE HCL 5 MG PO TABS: 5.0000 mg | ORAL_TABLET | Freq: Three times a day (TID) | ORAL | 0 refills | Status: DC | PRN

## 2021-08-19 MED ORDER — INSULIN ASPART 100 UNIT/ML IJ SOLN
0.0000 [IU] | Freq: Three times a day (TID) | INTRAMUSCULAR | Status: DC
  Administered 2021-08-19 (×2): 11 [IU] via SUBCUTANEOUS

## 2021-08-19 MED ORDER — PANTOPRAZOLE SODIUM 40 MG PO TBEC: 40.0000 mg | DELAYED_RELEASE_TABLET | Freq: Two times a day (BID) | ORAL | 2 refills | Status: DC

## 2021-08-19 NOTE — Plan of Care (Signed)
  Problem: Activity: Goal: Risk for activity intolerance will decrease Outcome: Progressing   Problem: Pain Managment: Goal: General experience of comfort will improve Outcome: Progressing   

## 2021-08-19 NOTE — Progress Notes (Signed)
Inpatient Diabetes Program Recommendations  AACE/ADA: New Consensus Statement on Inpatient Glycemic Control (2015)  Target Ranges:  Prepandial:   less than 140 mg/dL      Peak postprandial:   less than 180 mg/dL (1-2 hours)      Critically ill patients:  140 - 180 mg/dL   Lab Results  Component Value Date   GLUCAP 295 (H) 08/19/2021   HGBA1C 9.7 (H) 05/28/2021    Review of Glycemic Control  Diabetes history: DM1 Outpatient Diabetes medications: Levemir 16 QD, Novolog 0-15 TID Current orders for Inpatient glycemic control: Semglee 12 units QD, Novolog 0-20 units TID with meals and 0-5 HS  HgbA1C - 9.7% on 05/28/21  Inpatient Diabetes Program Recommendations:    Consider increasing Semglee to 16 units QD Decrease Novolog to 0-15 TID with meals and 0-5 HS If eating > 50%, add Novolog 3 units TID  Will need to f/u with PCP regarding management of her diabetes.  Thank you. Ailene Ards, RD, LDN, CDE Inpatient Diabetes Coordinator [phone]

## 2021-08-19 NOTE — Progress Notes (Signed)
  Transition of Care (TOC) Screening Note   Patient Details  Name: Kelli Hudson Date of Birth: 1990/10/03   Transition of Care [facility] CM/SW Contact:    Amada Jupiter, LCSW Phone Number: 08/19/2021, 9:24 AM    Transition of Care Department [facility]) has reviewed patient and no TOC needs have been identified at this time. We will continue to monitor patient advancement through interdisciplinary progression rounds. If new patient transition needs arise, please place a TOC consult.

## 2021-08-19 NOTE — Progress Notes (Signed)
Discharge instructions reviewed with patient. Verbalizes understanding. 

## 2021-08-19 NOTE — Discharge Summary (Signed)
Physician Discharge Summary   Patient: Kelli Hudson MRN: [id number] DOB: 25-Aug-1990  Admit date:     08/16/2021  Discharge date: 08/19/21  Discharge Physician: Edwin Dada   PCP: [name], MD     Recommendations at discharge:  Follow up with PCP Dr. Gena Fray in 1 week Follow up with Dr. Havery Moros for re-referral for Botox or other advanced gastroparesis therapies     Discharge Diagnoses: Principal Problem:   Intractable nausea and vomiting due to gastroparesis Active Problems:   Diabetic gastroparesis associated with type 1 diabetes mellitus ([place])   Type 1 diabetes mellitus with diabetic polyneuropathy ([place])   Hypertension associated with diabetes [facility])     Hospital Course: Kelli Hudson is a 31 y.o. F with T1DM, gastroparesis, HTN who presented with abdominal pain and vomiting.  In the ER, CT showed gastroenteritis and diffuse colitis vs nondistension.      * Intractable nausea and vomiting She was treated with IV fluids, antiemetics, started on scheduled Reglan.  Symptoms improved over 48 hours, and on the day of discharge, she was able to tolerate solid food and fluids without difficulty, still had residual abdominal pain, and was discharged with refills of her Reglan and antiemetics.                 The [facility] Controlled Substances Registry was reviewed for this patient prior to discharge.      Disposition: Home Diet recommendation:  Discharge Diet Orders (From admission, onward)     Start     Ordered   08/19/21 0000  Diet - low sodium heart healthy        08/19/21 1524             DISCHARGE MEDICATION: Allergies as of 08/19/2021   No Known Allergies      Medication List     STOP taking these medications    AMBULATORY NON FORMULARY MEDICATION       TAKE these medications    albuterol 108 (90 Base) MCG/ACT inhaler Commonly known as: VENTOLIN HFA Inhale 2 puffs into the lungs every 6 (six) hours  as needed for shortness of breath.   atorvastatin 20 MG tablet Commonly known as: LIPITOR Take 1 tablet (20 mg total) by mouth daily.   blood glucose meter kit and supplies Dispense based on patient and insurance preference. Use up to four times daily as directed. (FOR ICD-10 E10.9, E11.9).   blood glucose meter kit and supplies Dispense based on patient and insurance preference. Use up to four times daily as directed. (FOR ICD-10 E10.9, E11.9).   carvedilol 6.25 MG tablet Commonly known as: COREG Take 1 tablet (6.25 mg total) by mouth 2 (two) times daily with a meal.   Dexcom G6 Sensor Misc 1 each by Does not apply route daily.   Dexcom G6 Transmitter Misc 1 each by Does not apply route daily.   dicyclomine 20 MG tablet Commonly known as: BENTYL Take 1 tablet (20 mg total) by mouth 3 (three) times daily as needed for spasms.   hydrOXYzine 25 MG capsule Commonly known as: VISTARIL Take 1 capsule (25 mg total) by mouth every 8 (eight) hours as needed.   insulin regular 100 units/mL injection Commonly known as: NovoLIN R Inject 0-0.15 mLs (0-15 Units total) into the skin See admin instructions. Via pump.Start this medication after you follow up with your PCP What changed: additional instructions   Levemir FlexTouch 100 UNIT/ML FlexPen Generic drug: insulin detemir Inject 16 Units  into the skin daily.   metoCLOPramide 10 MG tablet Commonly known as: Reglan Take 1 tablet (10 mg total) by mouth every 8 (eight) hours as needed for nausea.   oxyCODONE 5 MG immediate release tablet Commonly known as: Oxy IR/ROXICODONE Take 1 tablet (5 mg total) by mouth every 8 (eight) hours as needed for breakthrough pain.   pantoprazole 40 MG tablet Commonly known as: PROTONIX Take 1 tablet (40 mg total) by mouth 2 (two) times daily before a meal.   Pen Needles 3/16" 31G X 5 MM Misc 1 each by Does not apply route in the morning, at noon, and at bedtime.   prochlorperazine 10 MG  tablet Commonly known as: COMPAZINE Take 0.5-1 tablets (5-10 mg total) by mouth every 8 (eight) hours as needed for nausea or vomiting.   promethazine 12.5 MG tablet Commonly known as: PHENERGAN Take 1 tablet (12.5 mg total) by mouth every 6 (six) hours as needed for nausea or vomiting.   True Metrix Blood Glucose Test test strip Generic drug: glucose blood Use as instructed What changed:  how much to take how to take this when to take this   TRUEplus Lancets 28G Misc assist with checking blood sugar TID and qhs What changed:  how much to take how to take this when to take this        Follow-up Information     [name], MD. Schedule an appointment as soon as possible for a visit in 1 week(s).   Specialty: Family Medicine Contact information: [place] [address] [phone]         Yetta Flock, MD. Call.   Specialty: Gastroenterology Why: To see if any other clinics can do Botox injections (since [facility] clinic does not) Contact information: [place] [address] [place] [address] [phone]                 Discharge Instructions     Diet - low sodium heart healthy   Complete by: As directed    Discharge instructions   Complete by: As directed    From Dr. Loleta Books: You were admitted for an episode of gastroparesis. You should eat small meals Manage your blood sugar carefully, keep your sugars under 200  For the next few days, take metoclopramide/Reglan 10 mg three times daily After a few days, you can reduce to as needed use You can use compazine if needed at the same time as Reglan  Avoid opiates, like oxycodone, use this as little as possible, and dispose of any you don't use  Look up gastroparesis support groups on Google. Look at: www.g-pact.org www.stuffthatworks.health/gastroparesis (or just google "stuff that works gastroparesis") aboutgastroparesis.org   Increase activity slowly    Complete by: As directed        Discharge Exam: Filed Weights   08/16/21 2046 08/17/21 1509  Weight: 78.6 kg 78 kg    General: Pt is alert, awake, not in acute distress Cardiovascular: RRR, nl S1-S2, no murmurs appreciated.   No LE edema.   Respiratory: Normal respiratory rate and rhythm.  CTAB without rales or wheezes. Abdominal: Abdomen soft and non-tender.  No distension or HSM.   Neuro/Psych: Strength symmetric in upper and lower extremities.  Judgment and insight appear normal.   Condition at discharge: good  The results of significant diagnostics from this hospitalization (including imaging, microbiology, ancillary and laboratory) are listed below for reference.   Imaging Studies: CT ABDOMEN PELVIS W CONTRAST  Result Date: 08/17/2021 CLINICAL DATA:  Nonlocalizing abdominal pain. EXAM: CT ABDOMEN AND PELVIS WITH CONTRAST TECHNIQUE: Multidetector CT imaging of the abdomen and pelvis was performed using the standard protocol following bolus administration of intravenous contrast. RADIATION DOSE REDUCTION: This exam was performed according to the departmental dose-optimization program which includes automated exposure control, adjustment of the mA and/or kV according to patient size and/or use of iterative reconstruction technique. CONTRAST:  177m OMNIPAQUE IOHEXOL 300 MG/ML  SOLN COMPARISON:  CTs with IV contrast 04/21/2021 and 06/05/2020. FINDINGS: Lower chest: No acute abnormality. Hepatobiliary: No focal liver abnormality is seen. No gallstones, gallbladder wall thickening, or biliary dilatation. Pancreas: No focal abnormality or adjacent inflammation. Spleen: Normal in size without focal abnormality. Adrenals/Urinary Tract: No renal or adrenal mass enhancement is seen and no calculus or urinary obstruction. Distended bladder with normal wall thickness. Stomach/Bowel: Thickened folds are again noted in the stomach and jejunum. There is no small bowel obstruction or inflammation. The  appendix is again noted distended and partially stool-filled with elongate course from the medial cecal base posterior inferiorly into the pelvis with the tip in the right perirectal space. The appendix measures up to 1.2 cm in the proximal to mid segment tapering to normal distally. Similar findings were noted on the prior studies and this is probably simply normal for the patient. There are no inflammatory changes. There is either wall thickening or underdistention in the ascending, transverse and descending colon. There are scattered sigmoid diverticula without inflammatory change. The rectal wall contracted and not well seen. Vascular/Lymphatic: No significant vascular findings are present. No enlarged abdominal or pelvic lymph nodes. Reproductive: Uterus and bilateral adnexa are unremarkable. Other: There is no free air, hemorrhage or fluid. There is no incarcerated hernia. There are clustered air pockets in the right mid abdominal wall probably due to a recent injection. Musculoskeletal: No acute or significant osseous findings. IMPRESSION: 1. Gastroenteritis, similar to prior studies with no small-bowel obstruction or inflammation. 2. Distended and partially stool-filled appendix but similar to prior studies and no inflammatory change. Probable normal variant. Correlate clinically to exclude early appendicitis. 3. Ascending, transverse and descending colitis versus nondistention. Electronically Signed   By: KTelford NabM.D.   On: 08/17/2021 07:12    Microbiology: Results for orders placed or performed during the hospital encounter of 07/29/21  MRSA Next Gen by PCR, Nasal     Status: None   Collection Time: 07/29/21  9:19 AM   Specimen: Nasal Mucosa; Nasal Swab  Result Value Ref Range Status   MRSA by PCR Next Gen NOT DETECTED NOT DETECTED Final    Comment: (NOTE) The GeneXpert MRSA Assay (FDA approved for NASAL specimens only), is one component of a comprehensive MRSA colonization  surveillance program. It is not intended to diagnose MRSA infection nor to guide or monitor treatment for MRSA infections. Test performance is not FDA approved in patients less than 267years old. [facility] [address], [place] [address]    Labs: CBC: Recent Labs  Lab 08/16/21 1836 08/17/21 0240 08/19/21 0327  WBC 7.9 15.2* 8.9  NEUTROABS 4.5  --   --   HGB 12.9 12.5 10.6*  HCT 38.7 38.8 32.1*  MCV 86.6 89.0 87.9  PLT 492* 424* 3706  Basic Metabolic Panel: Recent Labs  Lab 08/16/21 1836 08/17/21 0240 08/17/21 0614 08/18/21 0337 08/19/21 0327  NA 139 137 137 135 136  K 4.2 4.6 4.9 4.3 4.1  CL 102  104 102 104 102  CO2 27 19* 23 23 27   GLUCOSE 155* 323* 347* 257* 299*  BUN 23* 26* 28* 20 17  CREATININE 1.34* 1.11* 1.54* 1.10* 0.98  CALCIUM 10.1 9.4 9.3 9.0 9.2  MG  --  1.8  --   --   --   PHOS  --  5.2*  --   --   --    Liver Function Tests: Recent Labs  Lab 08/16/21 1836 08/19/21 0327  AST 19 18  ALT 21 17  ALKPHOS 89 82  BILITOT 0.8 0.7  PROT 8.0 6.2*  ALBUMIN 3.4* 2.8*   CBG: Recent Labs  Lab 08/18/21 1133 08/18/21 1627 08/18/21 2136 08/19/21 0734 08/19/21 1141  GLUCAP 174* 222* 288* 295* 273*    Discharge time spent: approximately 35 minutes spent on discharge counseling, evaluation of patient on day of discharge, and coordination of discharge planning with nursing, social work, pharmacy and case management  Signed: Edwin Dada, MD [facility] 08/19/2021

## 2021-08-22 ENCOUNTER — Telehealth: Payer: Self-pay

## 2021-08-22 NOTE — Telephone Encounter (Signed)
Transition Care Management Unsuccessful Follow-up Telephone Call  Date of discharge and from where:  Kelli Hudson  08/19/2021  Attempts:  1st Attempt  Reason for unsuccessful TCM follow-up call:  No answer/busy

## 2021-08-23 NOTE — Telephone Encounter (Signed)
Transition Care Management Unsuccessful Follow-up Telephone Call  Date of discharge and from where:  Kelli Hudson 08/19/2021  Attempts:  2nd Attempt  Reason for unsuccessful TCM follow-up call:  No answer/busy

## 2021-08-24 ENCOUNTER — Inpatient Hospital Stay (HOSPITAL_COMMUNITY)
Admission: EM | Admit: 2021-08-24 | Discharge: 2021-08-27 | DRG: 074 | Disposition: A | Discharge status: Home / Self Care | Payer: 59 | Attending: Internal Medicine | Admitting: Internal Medicine

## 2021-08-24 ENCOUNTER — Encounter (HOSPITAL_COMMUNITY): Payer: Self-pay

## 2021-08-24 ENCOUNTER — Emergency Department (HOSPITAL_COMMUNITY): Payer: 59

## 2021-08-24 ENCOUNTER — Other Ambulatory Visit: Payer: Self-pay

## 2021-08-24 DIAGNOSIS — E108 Type 1 diabetes mellitus with unspecified complications: Secondary | ICD-10-CM

## 2021-08-24 DIAGNOSIS — R9431 Abnormal electrocardiogram [ECG] [EKG]: Secondary | ICD-10-CM | POA: Diagnosis present

## 2021-08-24 DIAGNOSIS — Z833 Family history of diabetes mellitus: Secondary | ICD-10-CM

## 2021-08-24 DIAGNOSIS — E1065 Type 1 diabetes mellitus with hyperglycemia: Secondary | ICD-10-CM | POA: Diagnosis present

## 2021-08-24 DIAGNOSIS — R1013 Epigastric pain: Secondary | ICD-10-CM | POA: Diagnosis present

## 2021-08-24 DIAGNOSIS — Z89422 Acquired absence of other left toe(s): Secondary | ICD-10-CM | POA: Diagnosis not present

## 2021-08-24 DIAGNOSIS — Z8711 Personal history of peptic ulcer disease: Secondary | ICD-10-CM | POA: Diagnosis not present

## 2021-08-24 DIAGNOSIS — E1043 Type 1 diabetes mellitus with diabetic autonomic (poly)neuropathy: Principal | ICD-10-CM | POA: Diagnosis present

## 2021-08-24 DIAGNOSIS — K219 Gastro-esophageal reflux disease without esophagitis: Secondary | ICD-10-CM | POA: Diagnosis present

## 2021-08-24 DIAGNOSIS — E0590 Thyrotoxicosis, unspecified without thyrotoxic crisis or storm: Secondary | ICD-10-CM | POA: Diagnosis present

## 2021-08-24 DIAGNOSIS — E8889 Other specified metabolic disorders: Secondary | ICD-10-CM | POA: Diagnosis present

## 2021-08-24 DIAGNOSIS — E860 Dehydration: Secondary | ICD-10-CM | POA: Diagnosis present

## 2021-08-24 DIAGNOSIS — Z9641 Presence of insulin pump (external) (internal): Secondary | ICD-10-CM | POA: Diagnosis present

## 2021-08-24 DIAGNOSIS — Z79899 Other long term (current) drug therapy: Secondary | ICD-10-CM

## 2021-08-24 DIAGNOSIS — K922 Gastrointestinal hemorrhage, unspecified: Secondary | ICD-10-CM | POA: Diagnosis present

## 2021-08-24 DIAGNOSIS — R000 Tachycardia, unspecified: Secondary | ICD-10-CM | POA: Diagnosis present

## 2021-08-24 DIAGNOSIS — K3184 Gastroparesis: Secondary | ICD-10-CM | POA: Diagnosis present

## 2021-08-24 DIAGNOSIS — E119 Type 2 diabetes mellitus without complications: Secondary | ICD-10-CM | POA: Diagnosis not present

## 2021-08-24 DIAGNOSIS — Z8619 Personal history of other infectious and parasitic diseases: Secondary | ICD-10-CM

## 2021-08-24 DIAGNOSIS — E039 Hypothyroidism, unspecified: Secondary | ICD-10-CM | POA: Diagnosis present

## 2021-08-24 DIAGNOSIS — K920 Hematemesis: Secondary | ICD-10-CM | POA: Diagnosis not present

## 2021-08-24 DIAGNOSIS — I10 Essential (primary) hypertension: Secondary | ICD-10-CM | POA: Diagnosis present

## 2021-08-24 DIAGNOSIS — E785 Hyperlipidemia, unspecified: Secondary | ICD-10-CM

## 2021-08-24 DIAGNOSIS — R112 Nausea with vomiting, unspecified: Secondary | ICD-10-CM | POA: Diagnosis present

## 2021-08-24 DIAGNOSIS — K529 Noninfective gastroenteritis and colitis, unspecified: Secondary | ICD-10-CM

## 2021-08-24 DIAGNOSIS — Z794 Long term (current) use of insulin: Secondary | ICD-10-CM | POA: Diagnosis not present

## 2021-08-24 DIAGNOSIS — R1010 Upper abdominal pain, unspecified: Secondary | ICD-10-CM | POA: Diagnosis not present

## 2021-08-24 LAB — URINALYSIS, ROUTINE W REFLEX MICROSCOPIC
Bacteria, UA: NONE SEEN
Bilirubin Urine: NEGATIVE
Glucose, UA: >=500 mg/dL — AB
Ketones, ur: 80 mg/dL — AB
Leukocytes,Ua: NEGATIVE
Nitrite: NEGATIVE
Protein, ur: >=300 mg/dL — AB
Specific Gravity, Urine: 1.02 (ref 1.005–1.030)
pH: 6 (ref 5.0–8.0)

## 2021-08-24 LAB — BLOOD GAS, VENOUS
Acid-Base Excess: 1.5 mmol/L (ref 0.0–2.0)
Bicarbonate: 27.7 mmol/L (ref 20.0–28.0)
O2 Saturation: 25.1 %
Patient temperature: 37
pCO2, Ven: 49 mmHg (ref 44–60)
pH, Ven: 7.36 (ref 7.25–7.43)
pO2, Ven: <31 mmHg — CL (ref 32–45)

## 2021-08-24 LAB — COMPREHENSIVE METABOLIC PANEL
ALT: 19 U/L (ref 0–44)
AST: 20 U/L (ref 15–41)
Albumin: 3.6 g/dL (ref 3.5–5.0)
Alkaline Phosphatase: 93 U/L (ref 38–126)
Anion gap: 16 — ABNORMAL HIGH (ref 5–15)
BUN: 27 mg/dL — ABNORMAL HIGH (ref 6–20)
CO2: 22 mmol/L (ref 22–32)
Calcium: 9.7 mg/dL (ref 8.9–10.3)
Chloride: 98 mmol/L (ref 98–111)
Creatinine, Ser: 1.21 mg/dL — ABNORMAL HIGH (ref 0.44–1.00)
GFR, Estimated: >60 mL/min (ref >60)
Glucose, Bld: 383 mg/dL — ABNORMAL HIGH (ref 70–99)
Potassium: 4.1 mmol/L (ref 3.5–5.1)
Sodium: 136 mmol/L (ref 135–145)
Total Bilirubin: 1.3 mg/dL — ABNORMAL HIGH (ref 0.3–1.2)
Total Protein: 8.2 g/dL — ABNORMAL HIGH (ref 6.5–8.1)

## 2021-08-24 LAB — I-STAT CHEM 8, ED
BUN: 31 mg/dL — ABNORMAL HIGH (ref 6–20)
Calcium, Ion: 1.13 mmol/L — ABNORMAL LOW (ref 1.15–1.40)
Chloride: 99 mmol/L (ref 98–111)
Creatinine, Ser: 1.2 mg/dL — ABNORMAL HIGH (ref 0.44–1.00)
Glucose, Bld: 399 mg/dL — ABNORMAL HIGH (ref 70–99)
HCT: 37 % (ref 36.0–46.0)
Hemoglobin: 12.6 g/dL (ref 12.0–15.0)
Potassium: 4.6 mmol/L (ref 3.5–5.1)
Sodium: 135 mmol/L (ref 135–145)
TCO2: 24 mmol/L (ref 22–32)

## 2021-08-24 LAB — CBC WITH DIFFERENTIAL/PLATELET
Abs Immature Granulocytes: 0.04 10*3/uL (ref 0.00–0.07)
Basophils Absolute: 0 10*3/uL (ref 0.0–0.1)
Basophils Relative: 0 %
Eosinophils Absolute: 0.1 10*3/uL (ref 0.0–0.5)
Eosinophils Relative: 1 %
HCT: 36.7 % (ref 36.0–46.0)
Hemoglobin: 12 g/dL (ref 12.0–15.0)
Immature Granulocytes: 0 %
Lymphocytes Relative: 17 %
Lymphs Abs: 1.7 10*3/uL (ref 0.7–4.0)
MCH: 29.1 pg (ref 26.0–34.0)
MCHC: 32.7 g/dL (ref 30.0–36.0)
MCV: 88.9 fL (ref 80.0–100.0)
Monocytes Absolute: 0.4 10*3/uL (ref 0.1–1.0)
Monocytes Relative: 4 %
Neutro Abs: 7.7 10*3/uL (ref 1.7–7.7)
Neutrophils Relative %: 78 %
Platelets: 450 10*3/uL — ABNORMAL HIGH (ref 150–400)
RBC: 4.13 MIL/uL (ref 3.87–5.11)
RDW: 13.3 % (ref 11.5–15.5)
WBC: 9.9 10*3/uL (ref 4.0–10.5)
nRBC: 0 % (ref 0.0–0.2)

## 2021-08-24 LAB — CBG MONITORING, ED: Glucose-Capillary: 347 mg/dL — ABNORMAL HIGH (ref 70–99)

## 2021-08-24 LAB — I-STAT BETA HCG BLOOD, ED (MC, WL, AP ONLY): I-stat hCG, quantitative: <5 m[IU]/mL (ref <5)

## 2021-08-24 LAB — OCCULT BLOOD GASTRIC / DUODENUM (SPECIMEN CUP)
Occult Blood, Gastric: POSITIVE — AB
pH, Gastric: 5

## 2021-08-24 LAB — LIPASE, BLOOD: Lipase: 21 U/L (ref 11–51)

## 2021-08-24 LAB — BETA-HYDROXYBUTYRIC ACID: Beta-Hydroxybutyric Acid: 3.37 mmol/L — ABNORMAL HIGH (ref 0.05–0.27)

## 2021-08-24 MED ORDER — LACTATED RINGERS IV BOLUS
1000.0000 mL | Freq: Once | INTRAVENOUS | Status: AC
  Administered 2021-08-24: 1000 mL via INTRAVENOUS

## 2021-08-24 MED ORDER — ONDANSETRON HCL 4 MG/2ML IJ SOLN: 4.0000 mg | Freq: Four times a day (QID) | INTRAMUSCULAR | Status: DC | PRN

## 2021-08-24 MED ORDER — TRAZODONE HCL 50 MG PO TABS: 25.0000 mg | ORAL_TABLET | Freq: Every evening | ORAL | Status: DC | PRN

## 2021-08-24 MED ORDER — IOHEXOL 300 MG/ML  SOLN
100.0000 mL | Freq: Once | INTRAMUSCULAR | Status: AC | PRN
  Administered 2021-08-24: 100 mL via INTRAVENOUS

## 2021-08-24 MED ORDER — INSULIN DETEMIR 100 UNIT/ML ~~LOC~~ SOLN
16.0000 [IU] | Freq: Every day | SUBCUTANEOUS | Status: DC
Start: 2021-08-25 — End: 2021-08-25
  Filled 2021-08-24: qty 0.16

## 2021-08-24 MED ORDER — SODIUM CHLORIDE 0.9 % IV SOLN: INTRAVENOUS | Status: DC

## 2021-08-24 MED ORDER — PROCHLORPERAZINE MALEATE 10 MG PO TABS: 5.0000 mg | ORAL_TABLET | Freq: Three times a day (TID) | ORAL | Status: DC | PRN

## 2021-08-24 MED ORDER — METOCLOPRAMIDE HCL 10 MG PO TABS: 10.0000 mg | ORAL_TABLET | Freq: Three times a day (TID) | ORAL | Status: DC | PRN

## 2021-08-24 MED ORDER — ONDANSETRON HCL 4 MG/2ML IJ SOLN: 4.0000 mg | Freq: Once | INTRAMUSCULAR | Status: DC

## 2021-08-24 MED ORDER — METOCLOPRAMIDE HCL 5 MG/ML IJ SOLN
10.0000 mg | Freq: Once | INTRAMUSCULAR | Status: AC
  Administered 2021-08-24: 10 mg via INTRAVENOUS
  Filled 2021-08-24: qty 2

## 2021-08-24 MED ORDER — DICYCLOMINE HCL 20 MG PO TABS
20.0000 mg | ORAL_TABLET | Freq: Three times a day (TID) | ORAL | Status: DC | PRN
Start: 2021-08-24 — End: 2021-08-27

## 2021-08-24 MED ORDER — HYDROMORPHONE HCL 1 MG/ML IJ SOLN
0.5000 mg | Freq: Once | INTRAMUSCULAR | Status: AC
  Administered 2021-08-24: 0.5 mg via INTRAVENOUS
  Filled 2021-08-24: qty 1

## 2021-08-24 MED ORDER — ALBUTEROL SULFATE HFA 108 (90 BASE) MCG/ACT IN AERS: 2.0000 | INHALATION_SPRAY | Freq: Four times a day (QID) | RESPIRATORY_TRACT | Status: DC | PRN

## 2021-08-24 MED ORDER — ACETAMINOPHEN 650 MG RE SUPP: 650.0000 mg | Freq: Four times a day (QID) | RECTAL | Status: DC | PRN

## 2021-08-24 MED ORDER — ACETAMINOPHEN 325 MG PO TABS: 650.0000 mg | ORAL_TABLET | Freq: Four times a day (QID) | ORAL | Status: DC | PRN

## 2021-08-24 MED ORDER — HYDROXYZINE PAMOATE 25 MG PO CAPS
25.0000 mg | ORAL_CAPSULE | Freq: Three times a day (TID) | ORAL | Status: DC | PRN
Start: 2021-08-24 — End: 2021-08-25

## 2021-08-24 MED ORDER — INSULIN ASPART 100 UNIT/ML IJ SOLN: 0.0000 [IU] | INTRAMUSCULAR | Status: DC

## 2021-08-24 MED ORDER — PANTOPRAZOLE 80MG IVPB - SIMPLE MED
80.0000 mg | Freq: Once | INTRAVENOUS | Status: AC
  Administered 2021-08-25: 80 mg via INTRAVENOUS
  Filled 2021-08-24: qty 80

## 2021-08-24 MED ORDER — PANTOPRAZOLE INFUSION (NEW) - SIMPLE MED
8.0000 mg/h | INTRAVENOUS | Status: DC
  Administered 2021-08-25 – 2021-08-27 (×5): 8 mg/h via INTRAVENOUS
  Filled 2021-08-24 (×2): qty 80
  Filled 2021-08-24: qty 100
  Filled 2021-08-24: qty 80
  Filled 2021-08-24 (×2): qty 100
  Filled 2021-08-24 (×2): qty 80

## 2021-08-24 MED ORDER — MAGNESIUM HYDROXIDE 400 MG/5ML PO SUSP: 30.0000 mL | Freq: Every day | ORAL | Status: DC | PRN

## 2021-08-24 MED ORDER — HYDROMORPHONE HCL 1 MG/ML IJ SOLN
1.0000 mg | Freq: Once | INTRAMUSCULAR | Status: AC
  Administered 2021-08-24: 1 mg via INTRAVENOUS
  Filled 2021-08-24: qty 1

## 2021-08-24 MED ORDER — OXYCODONE HCL 5 MG PO TABS
5.0000 mg | ORAL_TABLET | Freq: Three times a day (TID) | ORAL | Status: DC | PRN
  Administered 2021-08-25: 5 mg via ORAL
  Filled 2021-08-24 (×2): qty 1

## 2021-08-24 MED ORDER — PANTOPRAZOLE SODIUM 40 MG IV SOLR: 40.0000 mg | Freq: Two times a day (BID) | INTRAVENOUS | Status: DC

## 2021-08-24 MED ORDER — CARVEDILOL 6.25 MG PO TABS
6.2500 mg | ORAL_TABLET | Freq: Two times a day (BID) | ORAL | Status: DC
  Administered 2021-08-25 – 2021-08-27 (×4): 6.25 mg via ORAL
  Filled 2021-08-24: qty 2
  Filled 2021-08-24 (×4): qty 1

## 2021-08-24 MED ORDER — ONDANSETRON HCL 4 MG PO TABS: 4.0000 mg | ORAL_TABLET | Freq: Four times a day (QID) | ORAL | Status: DC | PRN

## 2021-08-24 MED ORDER — PROMETHAZINE HCL 25 MG PO TABS: 12.5000 mg | ORAL_TABLET | Freq: Four times a day (QID) | ORAL | Status: DC | PRN

## 2021-08-24 MED ORDER — ATORVASTATIN CALCIUM 20 MG PO TABS
20.0000 mg | ORAL_TABLET | Freq: Every day | ORAL | Status: DC
  Administered 2021-08-25 – 2021-08-27 (×3): 20 mg via ORAL
  Filled 2021-08-24: qty 1
  Filled 2021-08-24: qty 2
  Filled 2021-08-24: qty 1

## 2021-08-24 NOTE — H&P (Signed)
PATIENT NAME: Kelli Hudson    MR#:  [id number]  DATE OF BIRTH:  [birth year]  DATE OF ADMISSION:  08/24/2021  PRIMARY CARE PHYSICIAN: Kelli Salter, MD   Patient is coming from: Home  REQUESTING/REFERRING PHYSICIAN: Roemhildt, Lorin T, PA-C  CHIEF COMPLAINT:   Chief Complaint  Patient presents with   Gastroparesis    HISTORY OF PRESENT ILLNESS:  Kelli Hudson is a 31 y.o. female with medical history significant for type 1 diabetes mellitus, diabetic gastroparesis, GERD, hypertension and hypothyroidism with history of H. pylori, who presented to the ER with a [place] of epigastric abdominal pain with associated chronic nausea and vomiting since Friday.  She admitted to vomiting of coffee-ground emesis.  She has been having chest pain that she describes as pressure.  She denies any melena or bright red bleeding per rectum.  No dyspnea or cough or wheezing or hemoptysis.  No dysuria, oliguria or hematuria or flank pain.  No other bleeding diathesis.  She denies any fever or chills.  ED Course: When she came to the ER, BP was 172/122 and later 207/137 with heart rate 123 and later 130 and later blood pressure was 114/64 with a heart rate of 116.  Labs revealed a blood Leukos of 399 with a BUN of 31 and creatinine 1.2, total bili of 1.3 and troponin of 8.2.  ABG showed pH 7.36 and bicarb 27.7.  CBC showed hemoglobin of 12.36.7 with platelets of 450.  Beta hydroxybutyrate was 3.37.  Patient's anion gap was 16 and CO2 was 22.  A Gastroccult came back positive. EKG as reviewed by me : EKG showed sinus tachycardia with rate 131 with poor R wave progression. Imaging: I recommended abdominal pelvic CT scan showed mild diffuse colonic wall thickening with surrounding fat stranding suggesting colitis as well as prominent appendix with no surrounding inflammatory changes with findings similar in appearance to the prior exam and likely reflecting a normal  variant.  The patient was given 0.5 mg of IV Dilaudid twice followed by another 1 mg 10 mg of IV Reglan 1 L bolus of IV lactated Ringer.  She will be admitted to progressive unit bed for further evaluation and management. PAST MEDICAL HISTORY:   Past Medical History:  Diagnosis Date   Acute H. pylori gastric ulcer    Diabetes mellitus ([place])    Diabetic gastroparesis ([place])    Gastroparesis    GERD (gastroesophageal reflux disease)    Hypertension    Hyperthyroidism     PAST SURGICAL HISTORY:   Past Surgical History:  Procedure Laterality Date   AMPUTATION TOE Left 03/10/2018   Procedure: AMPUTATION FIFTH TOE;  Surgeon: Trula Slade, DPM;  Location: [facility];  Service: Podiatry;  Laterality: Left;   BIOPSY  01/28/2020   Procedure: BIOPSY;  Surgeon: Otis Brace, MD;  Location: [facility];  Service: Gastroenterology;;   ESOPHAGOGASTRODUODENOSCOPY N/A 01/28/2020   Procedure: ESOPHAGOGASTRODUODENOSCOPY (EGD);  Surgeon: Otis Brace, MD;  Location: [facility];  Service: Gastroenterology;  Laterality: N/A;   ESOPHAGOGASTRODUODENOSCOPY (EGD) WITH PROPOFOL Left 09/08/2015   Procedure: ESOPHAGOGASTRODUODENOSCOPY (EGD) WITH PROPOFOL;  Surgeon: Arta Silence, MD;  Location: [facility];  Service: Endoscopy;  Laterality: Left;   WISDOM TOOTH EXTRACTION      SOCIAL HISTORY:   Social History   Tobacco Use   Smoking status: Never   Smokeless tobacco: Never  Substance Use Topics   Alcohol use: Yes    Alcohol/week: 1.0 standard  drink of alcohol    Types: 1 Shots of liquor per week    Comment: occasional     FAMILY HISTORY:   Family History  Problem Relation Age of Onset   Lung cancer Mother    Diabetes Mother    Bipolar disorder Father    Liver cancer Maternal Grandfather    Diabetes Maternal Aunt        x 2   Pancreatic cancer Maternal Uncle    Prostate cancer Maternal Uncle    Breast cancer Other        maternal great aunt   Heart  disease Other    Ovarian cancer Other        maternal great aunt   Kidney disease Other        maternal great aunt   Colon cancer Neg Hx    Stomach cancer Neg Hx     DRUG ALLERGIES:  No Known Allergies  REVIEW OF SYSTEMS:   ROS As per history of present illness. All pertinent systems were reviewed above. Constitutional, HEENT, cardiovascular, respiratory, GI, GU, musculoskeletal, neuro, psychiatric, endocrine, integumentary and hematologic systems were reviewed and are otherwise negative/unremarkable except for positive findings mentioned above in the HPI.   MEDICATIONS AT HOME:   Prior to Admission medications   Medication Sig Start Date End Date Taking? Authorizing Provider  albuterol (VENTOLIN HFA) 108 (90 Base) MCG/ACT inhaler Inhale 2 puffs into the lungs every 6 (six) hours as needed for shortness of breath.    [provider]  atorvastatin (LIPITOR) 20 MG tablet Take 1 tablet (20 mg total) by mouth daily. 04/10/21   Kelli Salter, MD  blood glucose meter kit and supplies Dispense based on patient and insurance preference. Use up to four times daily as directed. (FOR ICD-10 E10.9, E11.9). 06/14/21   Shelly Coss, MD  blood glucose meter kit and supplies Dispense based on patient and insurance preference. Use up to four times daily as directed. (FOR ICD-10 E10.9, E11.9). 07/31/21   Georgette Shell, MD  carvedilol (COREG) 6.25 MG tablet Take 1 tablet (6.25 mg total) by mouth 2 (two) times daily with a meal. 07/01/21   Nita Sells, MD  Continuous Blood Gluc Sensor (DEXCOM G6 SENSOR) MISC 1 each by Does not apply route daily. 11/21/20   Kelli Salter, MD  Continuous Blood Gluc Transmit (DEXCOM G6 TRANSMITTER) MISC 1 each by Does not apply route daily. 09/24/20   Kelli Salter, MD  dicyclomine (BENTYL) 20 MG tablet Take 1 tablet (20 mg total) by mouth 3 (three) times daily as needed for spasms. 07/31/21   Georgette Shell, MD  glucose blood (TRUE METRIX  BLOOD GLUCOSE TEST) test strip Use as instructed Patient taking differently: 1 each by Other route See admin instructions. Use as instructed 09/03/20   Kelli Salter, MD  hydrOXYzine (VISTARIL) 25 MG capsule Take 1 capsule (25 mg total) by mouth every 8 (eight) hours as needed. 07/31/21   Georgette Shell, MD  insulin detemir (LEVEMIR FLEXTOUCH) 100 UNIT/ML FlexPen Inject 16 Units into the skin daily. 06/14/21   Shelly Coss, MD  Insulin Pen Needle (PEN NEEDLES 3/16") 31G X 5 MM MISC 1 each by Does not apply route in the morning, at noon, and at bedtime. 06/14/21   Shelly Coss, MD  insulin regular (NOVOLIN R) 100 units/mL injection Inject 0-0.15 mLs (0-15 Units total) into the skin See admin instructions. Via pump.Start this medication after you follow up with your PCP  Patient taking differently: Inject 0-15 Units into the skin See admin instructions. Via pump. 06/14/21   Shelly Coss, MD  metoCLOPramide (REGLAN) 10 MG tablet Take 1 tablet (10 mg total) by mouth every 8 (eight) hours as needed for nausea. 08/19/21 09/18/21  Danford, Suann Larry, MD  oxyCODONE (OXY IR/ROXICODONE) 5 MG immediate release tablet Take 1 tablet (5 mg total) by mouth every 8 (eight) hours as needed for breakthrough pain. 08/19/21   Danford, Suann Larry, MD  pantoprazole (PROTONIX) 40 MG tablet Take 1 tablet (40 mg total) by mouth 2 (two) times daily before a meal. 08/19/21   Danford, Suann Larry, MD  prochlorperazine (COMPAZINE) 10 MG tablet Take 0.5-1 tablets (5-10 mg total) by mouth every 8 (eight) hours as needed for nausea or vomiting. 08/19/21   Danford, Suann Larry, MD  promethazine (PHENERGAN) 12.5 MG tablet Take 1 tablet (12.5 mg total) by mouth every 6 (six) hours as needed for nausea or vomiting. 07/31/21   Georgette Shell, MD  TRUEPLUS LANCETS 28G MISC assist with checking blood sugar TID and qhs Patient taking differently: 1 each by Other route See admin instructions. assist with checking blood sugar  TID and qhs 09/10/15   Zettie Pho S, PA-C  insulin aspart (NOVOLOG) 100 UNIT/ML FlexPen Inject 5 Units into the skin 3 (three) times daily with meals. 02/23/18 07/05/19  Donne Hazel, MD      VITAL SIGNS:  Blood pressure 109/79, pulse (!) 115, temperature 98.5 F (36.9 C), resp. rate 14, SpO2 99 %.  PHYSICAL EXAMINATION:  Physical Exam  GENERAL:  31 y.o.-year-old African-American female patient lying in the bed with no acute distress.  EYES: Pupils equal, round, reactive to light and accommodation. No scleral icterus. Extraocular muscles intact.  HEENT: Head atraumatic, normocephalic. Oropharynx and nasopharynx clear.  NECK:  Supple, no jugular venous distention. No thyroid enlargement, no tenderness.  LUNGS: Normal breath sounds bilaterally, no wheezing, rales,rhonchi or crepitation. No use of accessory muscles of respiration.  CARDIOVASCULAR: Regular rate and rhythm, S1, S2 normal. No murmurs, rubs, or gallops.  ABDOMEN: Soft, nondistended, with mild epigastric tenderness without (guarding or rigidity.. Bowel sounds present. No organomegaly or mass.  EXTREMITIES: No pedal edema, cyanosis, or clubbing.  NEUROLOGIC: Cranial nerves II through XII are intact. Muscle strength 5/5 in all extremities. Sensation intact. Gait not checked.  PSYCHIATRIC: The patient is alert and oriented x 3.  Normal affect and good eye contact. SKIN: No obvious rash, lesion, or ulcer.   LABORATORY PANEL:   CBC Recent Labs  Lab 08/24/21 1916  WBC 9.9  HGB 12.0  HCT 36.7  PLT 450*   ------------------------------------------------------------------------------------------------------------------  Chemistries  Recent Labs  Lab 08/24/21 1916  NA 136  K 4.1  CL 98  CO2 22  GLUCOSE 383*  BUN 27*  CREATININE 1.21*  CALCIUM 9.7  AST 20  ALT 19  ALKPHOS 93  BILITOT 1.3*    ------------------------------------------------------------------------------------------------------------------  Cardiac Enzymes No results for input(s): "TROPONINI" in the last 168 hours. ------------------------------------------------------------------------------------------------------------------  RADIOLOGY:  CT ABDOMEN PELVIS W CONTRAST  Result Date: 08/24/2021 CLINICAL DATA:  Abdominal pain, acute, nonlocalized. History of gastroparesis. EXAM: CT ABDOMEN AND PELVIS WITH CONTRAST TECHNIQUE: Multidetector CT imaging of the abdomen and pelvis was performed using the standard protocol following bolus administration of intravenous contrast. RADIATION DOSE REDUCTION: This exam was performed according to the departmental dose-optimization program which includes automated exposure control, adjustment of the mA and/or kV according to patient size and/or use of iterative reconstruction  technique. CONTRAST:  161m OMNIPAQUE IOHEXOL 300 MG/ML  SOLN COMPARISON:  08/17/2021. FINDINGS: Lower chest: No acute abnormality. Hepatobiliary: No focal liver abnormality is seen. No gallstones, gallbladder wall thickening, or biliary dilatation. Pancreas: Unremarkable. No pancreatic ductal dilatation or surrounding inflammatory changes. Spleen: Normal in size without focal abnormality. Adrenals/Urinary Tract: Adrenal glands are unremarkable. Kidneys are normal, without renal calculi, focal lesion, or hydronephrosis. Bladder is unremarkable. Stomach/Bowel: The stomach is within normal limits. No bowel obstruction, free air, or pneumatosis. A few scattered diverticula are present along the colon without evidence of diverticulitis. The proximal appendix is prominent in size and air-filled at 1 cm, not significantly changed from the prior exam. No surrounding inflammatory changes are identified. There is mild diffuse colonic wall thickening with surrounding fat stranding. Vascular/Lymphatic: No significant vascular  findings are present. No enlarged abdominal or pelvic lymph nodes. Reproductive: Uterus and bilateral adnexa are unremarkable. Other: No abdominopelvic ascites. Musculoskeletal: No acute or significant osseous findings. IMPRESSION: 1. Mild diffuse colonic wall thickening with surrounding fat stranding suggesting colitis. 2. Prominent appendix with no surrounding inflammatory changes. Findings are similar in appearance to the prior exam and likely reflect a normal variant. Correlate clinically to exclude appendicitis. Electronically Signed   By: LBrett FairyM.D.   On: 08/24/2021 22:38      IMPRESSION AND PLAN:  Assessment and Plan: * GI bleeding - The patient will be admitted to a progressive unit bed. - She will be kept n.p.o. after midnight. -We will place on IV Protonix drip. - We will follow serial hemoglobins and hematocrits. - She will be typed and crossmatched.  At this time H&H are within normal. - GI consultation will be obtained. - Dr. GCarlean Purlwith [facility] was notified about the patient.  Acute colitis - We will place her on IV Rocephin and Flagyl. - Pain management will be provided.  GERD without esophagitis - We will continue PPI therapy with IV Protonix.  Type 2 diabetes mellitus without complications ([place] - The patient will be placed on supplemental coverage with NovoLog. - We will continue basal coverage.  Dyslipidemia Continue statin therapy.  Essential hypertension - We will continue her antihypertensives.   DVT prophylaxis: SCDs.  Advanced Care Planning:  Code Status: full code.  Family Communication:  The plan of care was discussed in details with the patient (and family). I answered all questions. The patient agreed to proceed with the above mentioned plan. Further management will depend upon hospital course. Disposition Plan: Back to previous home environment [facility]   All the records are reviewed and case discussed with ED  provider.  Status is: Inpatient  At the time of the admission, it appears that the appropriate admission status for this patient is inpatient.  This is judged to be reasonable and necessary in order to provide the required intensity of service to ensure the patient's safety given the presenting symptoms, physical exam findings and initial radiographic and laboratory data in the context of comorbid conditions.  The patient requires inpatient status due to high intensity of service, high risk of further deterioration and high frequency of surveillance required.  I certify that at the time of admission, it is my clinical judgment that the patient will require inpatient hospital care extending more than 2 midnights.                            Dispo: The patient is from: Home  Anticipated d/c is to: Home              Patient currently is not medically stable to d/c.              Difficult to place patient: No  Christel Mormon M.D on 08/25/2021 at 12:30 AM  [facility]   From 7 PM-7 AM, contact night-coverage [email or web address]  CC: Primary care physician; Kelli Salter, MD

## 2021-08-24 NOTE — ED Provider Triage Note (Signed)
Emergency Medicine Provider Triage Evaluation Note  Kelli Hudson , a 31 y.o. female  was evaluated in triage.  Pt complains of nausea, vomiting, and abdominal pain. The patient reports it is her gastroparesis. She was admitted three times for this last month. Denies any fevers. Patient is a Type 1. No insulin given today because of nausea and vomiting  Review of Systems  Positive:  Negative:   Physical Exam  BP (!) 172/122 (BP Location: Left Arm)   Pulse (!) 123   Temp 98.5 F (36.9 C)   Resp 18   SpO2 100%  Gen:   Awake, no distress   Resp:  Normal effort  MSK:   Moves extremities without difficulty  Other:  Abdomen soft, but diffusely tender.   Medical Decision Making  Medically screening exam initiated at 5:57 PM.  Appropriate orders placed.  Kelli Hudson was informed that the remainder of the evaluation will be completed by another provider, this initial triage assessment does not replace that evaluation, and the importance of remaining in the ED until their evaluation is complete.  Nursing staff aware that the patient needs to be roomed NOW. Fluids, nausea medications, as well as pain medications ordered.    Achille Rich, [place] 08/24/21 1759

## 2021-08-24 NOTE — H&P (Incomplete)
[place]   PATIENT NAME: Kelli Hudson    MR#:  [id number]  DATE OF BIRTH:  1990/07/13  DATE OF ADMISSION:  08/24/2021  PRIMARY CARE PHYSICIAN: [name], MD   Patient is coming from: Home  REQUESTING/REFERRING PHYSICIAN: ***  CHIEF COMPLAINT:   Chief Complaint  Patient presents with  . Gastroparesis    HISTORY OF PRESENT ILLNESS:  Kelli Hudson is a 31 y.o. female with medical history significant for ***  ED Course: *** EKG as reviewed by me : *** Imaging: *** PAST MEDICAL HISTORY:   Past Medical History:  Diagnosis Date  . Acute H. pylori gastric ulcer   . Diabetes mellitus ([place])   . Diabetic gastroparesis ([place])   . Gastroparesis   . GERD (gastroesophageal reflux disease)   . Hypertension   . Hyperthyroidism     PAST SURGICAL HISTORY:   Past Surgical History:  Procedure Laterality Date  . AMPUTATION TOE Left 03/10/2018   Procedure: AMPUTATION FIFTH TOE;  Surgeon: Trula Slade, DPM;  Location: [place];  Service: Podiatry;  Laterality: Left;  . BIOPSY  01/28/2020   Procedure: BIOPSY;  Surgeon: Otis Brace, MD;  Location: [facility]COPY;  Service: Gastroenterology;;  . ESOPHAGOGASTRODUODENOSCOPY N/A 01/28/2020   Procedure: ESOPHAGOGASTRODUODENOSCOPY (EGD);  Surgeon: Otis Brace, MD;  Location: [facility];  Service: Gastroenterology;  Laterality: N/A;  . ESOPHAGOGASTRODUODENOSCOPY (EGD) WITH PROPOFOL Left 09/08/2015   Procedure: ESOPHAGOGASTRODUODENOSCOPY (EGD) WITH PROPOFOL;  Surgeon: Arta Silence, MD;  Location: [facility]COPY;  Service: Endoscopy;  Laterality: Left;  . WISDOM TOOTH EXTRACTION      SOCIAL HISTORY:   Social History   Tobacco Use  . Smoking status: Never  . Smokeless tobacco: Never  Substance Use Topics  . Alcohol use: Yes    Alcohol/week: 1.0 standard drink of alcohol    Types: 1 Shots of liquor per week    Comment: occasional     FAMILY HISTORY:   Family History   Problem Relation Age of Onset  . Lung cancer Mother   . Diabetes Mother   . Bipolar disorder Father   . Liver cancer Maternal Grandfather   . Diabetes Maternal Aunt        x 2  . Pancreatic cancer Maternal Uncle   . Prostate cancer Maternal Uncle   . Breast cancer Other        maternal great aunt  . Heart disease Other   . Ovarian cancer Other        maternal great aunt  . Kidney disease Other        maternal great aunt  . Colon cancer Neg Hx   . Stomach cancer Neg Hx     DRUG ALLERGIES:  No Known Allergies  REVIEW OF SYSTEMS:   ROS As per history of present illness. All pertinent systems were reviewed above. Constitutional, HEENT, cardiovascular, respiratory, GI, GU, musculoskeletal, neuro, psychiatric, endocrine, integumentary and hematologic systems were reviewed and are otherwise negative/unremarkable except for positive findings mentioned above in the HPI.   MEDICATIONS AT HOME:   Prior to Admission medications   Medication Sig Start Date End Date Taking? Authorizing Provider  albuterol (VENTOLIN HFA) 108 (90 Base) MCG/ACT inhaler Inhale 2 puffs into the lungs every 6 (six) hours as needed for shortness of breath.    [provider]  atorvastatin (LIPITOR) 20 MG tablet Take 1 tablet (20 mg total) by mouth daily. 04/10/21   [name], MD  blood glucose  meter kit and supplies Dispense based on patient and insurance preference. Use up to four times daily as directed. (FOR ICD-10 E10.9, E11.9). 06/14/21   Shelly Coss, MD  blood glucose meter kit and supplies Dispense based on patient and insurance preference. Use up to four times daily as directed. (FOR ICD-10 E10.9, E11.9). 07/31/21   Georgette Shell, MD  carvedilol (COREG) 6.25 MG tablet Take 1 tablet (6.25 mg total) by mouth 2 (two) times daily with a meal. 07/01/21   Nita Sells, MD  Continuous Blood Gluc Sensor (DEXCOM G6 SENSOR) MISC 1 each by Does not apply route daily. 11/21/20   [name], MD  Continuous Blood Gluc Transmit (DEXCOM G6 TRANSMITTER) MISC 1 each by Does not apply route daily. 09/24/20   [name], MD  dicyclomine (BENTYL) 20 MG tablet Take 1 tablet (20 mg total) by mouth 3 (three) times daily as needed for spasms. 07/31/21   Georgette Shell, MD  glucose blood (TRUE METRIX BLOOD GLUCOSE TEST) test strip Use as instructed Patient taking differently: 1 each by Other route See admin instructions. Use as instructed 09/03/20   [name], MD  hydrOXYzine (VISTARIL) 25 MG capsule Take 1 capsule (25 mg total) by mouth every 8 (eight) hours as needed. 07/31/21   Georgette Shell, MD  insulin detemir (LEVEMIR FLEXTOUCH) 100 UNIT/ML FlexPen Inject 16 Units into the skin daily. 06/14/21   Shelly Coss, MD  Insulin Pen Needle (PEN NEEDLES 3/16") 31G X 5 MM MISC 1 each by Does not apply route in the morning, at noon, and at bedtime. 06/14/21   Shelly Coss, MD  insulin regular (NOVOLIN R) 100 units/mL injection Inject 0-0.15 mLs (0-15 Units total) into the skin See admin instructions. Via pump.Start this medication after you follow up with your PCP Patient taking differently: Inject 0-15 Units into the skin See admin instructions. Via pump. 06/14/21   Shelly Coss, MD  metoCLOPramide (REGLAN) 10 MG tablet Take 1 tablet (10 mg total) by mouth every 8 (eight) hours as needed for nausea. 08/19/21 09/18/21  Danford, Suann Larry, MD  oxyCODONE (OXY IR/ROXICODONE) 5 MG immediate release tablet Take 1 tablet (5 mg total) by mouth every 8 (eight) hours as needed for breakthrough pain. 08/19/21   Danford, Suann Larry, MD  pantoprazole (PROTONIX) 40 MG tablet Take 1 tablet (40 mg total) by mouth 2 (two) times daily before a meal. 08/19/21   Danford, Suann Larry, MD  prochlorperazine (COMPAZINE) 10 MG tablet Take 0.5-1 tablets (5-10 mg total) by mouth every 8 (eight) hours as needed for nausea or vomiting. 08/19/21   Danford, Suann Larry, MD  promethazine (PHENERGAN)  12.5 MG tablet Take 1 tablet (12.5 mg total) by mouth every 6 (six) hours as needed for nausea or vomiting. 07/31/21   Georgette Shell, MD  TRUEPLUS LANCETS 28G MISC assist with checking blood sugar TID and qhs Patient taking differently: 1 each by Other route See admin instructions. assist with checking blood sugar TID and qhs 09/10/15   Zettie Pho S, PA-C  insulin aspart (NOVOLOG) 100 UNIT/ML FlexPen Inject 5 Units into the skin 3 (three) times daily with meals. 02/23/18 07/05/19  Donne Hazel, MD      VITAL SIGNS:  Blood pressure 90/61, pulse (!) 111, temperature 98.5 F (36.9 C), resp. rate 14, SpO2 98 %.  PHYSICAL EXAMINATION:  Physical Exam  GENERAL:  31 y.o.-year-old patient lying in the bed with no acute distress.  EYES: Pupils equal,  round, reactive to light and accommodation. No scleral icterus. Extraocular muscles intact.  HEENT: Head atraumatic, normocephalic. Oropharynx and nasopharynx clear.  NECK:  Supple, no jugular venous distention. No thyroid enlargement, no tenderness.  LUNGS: Normal breath sounds bilaterally, no wheezing, rales,rhonchi or crepitation. No use of accessory muscles of respiration.  CARDIOVASCULAR: Regular rate and rhythm, S1, S2 normal. No murmurs, rubs, or gallops.  ABDOMEN: Soft, nondistended, nontender. Bowel sounds present. No organomegaly or mass.  EXTREMITIES: No pedal edema, cyanosis, or clubbing.  NEUROLOGIC: Cranial nerves II through XII are intact. Muscle strength 5/5 in all extremities. Sensation intact. Gait not checked.  PSYCHIATRIC: The patient is alert and oriented x 3.  Normal affect and good eye contact. SKIN: No obvious rash, lesion, or ulcer.   LABORATORY PANEL:   CBC Recent Labs  Lab 08/24/21 1916  WBC 9.9  HGB 12.0  HCT 36.7  PLT 450*   ------------------------------------------------------------------------------------------------------------------  Chemistries  Recent Labs  Lab 08/24/21 1916  NA 136  K 4.1   CL 98  CO2 22  GLUCOSE 383*  BUN 27*  CREATININE 1.21*  CALCIUM 9.7  AST 20  ALT 19  ALKPHOS 93  BILITOT 1.3*   ------------------------------------------------------------------------------------------------------------------  Cardiac Enzymes No results for input(s): "TROPONINI" in the last 168 hours. ------------------------------------------------------------------------------------------------------------------  RADIOLOGY:  CT ABDOMEN PELVIS W CONTRAST  Result Date: 08/24/2021 CLINICAL DATA:  Abdominal pain, acute, nonlocalized. History of gastroparesis. EXAM: CT ABDOMEN AND PELVIS WITH CONTRAST TECHNIQUE: Multidetector CT imaging of the abdomen and pelvis was performed using the standard protocol following bolus administration of intravenous contrast. RADIATION DOSE REDUCTION: This exam was performed according to the departmental dose-optimization program which includes automated exposure control, adjustment of the mA and/or kV according to patient size and/or use of iterative reconstruction technique. CONTRAST:  166m OMNIPAQUE IOHEXOL 300 MG/ML  SOLN COMPARISON:  08/17/2021. FINDINGS: Lower chest: No acute abnormality. Hepatobiliary: No focal liver abnormality is seen. No gallstones, gallbladder wall thickening, or biliary dilatation. Pancreas: Unremarkable. No pancreatic ductal dilatation or surrounding inflammatory changes. Spleen: Normal in size without focal abnormality. Adrenals/Urinary Tract: Adrenal glands are unremarkable. Kidneys are normal, without renal calculi, focal lesion, or hydronephrosis. Bladder is unremarkable. Stomach/Bowel: The stomach is within normal limits. No bowel obstruction, free air, or pneumatosis. A few scattered diverticula are present along the colon without evidence of diverticulitis. The proximal appendix is prominent in size and air-filled at 1 cm, not significantly changed from the prior exam. No surrounding inflammatory changes are identified. There  is mild diffuse colonic wall thickening with surrounding fat stranding. Vascular/Lymphatic: No significant vascular findings are present. No enlarged abdominal or pelvic lymph nodes. Reproductive: Uterus and bilateral adnexa are unremarkable. Other: No abdominopelvic ascites. Musculoskeletal: No acute or significant osseous findings. IMPRESSION: 1. Mild diffuse colonic wall thickening with surrounding fat stranding suggesting colitis. 2. Prominent appendix with no surrounding inflammatory changes. Findings are similar in appearance to the prior exam and likely reflect a normal variant. Correlate clinically to exclude appendicitis. Electronically Signed   By: LBrett FairyM.D.   On: 08/24/2021 22:38      IMPRESSION AND PLAN:  Assessment and Plan: No notes have been filed under this hospital service. Service: Hospitalist      DVT prophylaxis: Lovenox***  Advanced Care Planning:  Code Status: full code***  Family Communication:  The plan of care was discussed in details with the patient (and family). I answered all questions. The patient agreed to proceed with the above mentioned plan. Further management will depend  upon hospital course. Disposition Plan: Back to previous home environment Consults called: none***  All the records are reviewed and case discussed with ED provider.  Status is: Inpatient {Inpatient:23812}   At the time of the admission, it appears that the appropriate admission status for this patient is inpatient.  This is judged to be reasonable and necessary in order to provide the required intensity of service to ensure the patient's safety given the presenting symptoms, physical exam findings and initial radiographic and laboratory data in the context of comorbid conditions.  The patient requires inpatient status due to high intensity of service, high risk of further deterioration and high frequency of surveillance required.  I certify that at the time of admission, it is my  clinical judgment that the patient will require inpatient hospital care extending more than 2 midnights.                            Dispo: The patient is from: Home              Anticipated d/c is to: Home              Patient currently is not medically stable to d/c.              Difficult to place patient: No  Christel Mormon M.D on 08/24/2021 at 11:57 PM  [facility]   From 7 PM-7 AM, contact night-coverage [email or web address]  CC: Primary care physician; [name], MD

## 2021-08-24 NOTE — ED Triage Notes (Signed)
Pt c/o gastroparesis flare up. Pt c/o N/V x2 days.

## 2021-08-24 NOTE — ED Provider Notes (Signed)
[place] DEPT Provider Note   CSN: [id number] Arrival date & time: 08/24/21  1621     History  Chief Complaint  Patient presents with   Gastroparesis    Kelli Hudson is a 31 y.o. female who presents emergency department for abdominal pain, nausea and vomiting starting yesterday.  Patient has a history of gastroparesis, and states that the symptoms feel similarly.  Was recently discharged from the hospital on 7/3 for similar symptoms.  Has not been getting relief from home medications.  Denies any fevers, urinary symptoms, constipation or diarrhea.  Last bowel movement was this morning and was normal for her.  HPI     Home Medications Prior to Admission medications   Medication Sig Start Date End Date Taking? Authorizing Provider  albuterol (VENTOLIN HFA) 108 (90 Base) MCG/ACT inhaler Inhale 2 puffs into the lungs every 6 (six) hours as needed for shortness of breath.    [provider]  atorvastatin (LIPITOR) 20 MG tablet Take 1 tablet (20 mg total) by mouth daily. 04/10/21   [name], MD  blood glucose meter kit and supplies Dispense based on patient and insurance preference. Use up to four times daily as directed. (FOR ICD-10 E10.9, E11.9). 06/14/21   Shelly Coss, MD  blood glucose meter kit and supplies Dispense based on patient and insurance preference. Use up to four times daily as directed. (FOR ICD-10 E10.9, E11.9). 07/31/21   Georgette Shell, MD  carvedilol (COREG) 6.25 MG tablet Take 1 tablet (6.25 mg total) by mouth 2 (two) times daily with a meal. 07/01/21   Nita Sells, MD  Continuous Blood Gluc Sensor (DEXCOM G6 SENSOR) MISC 1 each by Does not apply route daily. 11/21/20   [name], MD  Continuous Blood Gluc Transmit (DEXCOM G6 TRANSMITTER) MISC 1 each by Does not apply route daily. 09/24/20   [name], MD  dicyclomine (BENTYL) 20 MG tablet Take 1 tablet (20 mg total) by mouth 3 (three) times  daily as needed for spasms. 07/31/21   Georgette Shell, MD  glucose blood (TRUE METRIX BLOOD GLUCOSE TEST) test strip Use as instructed Patient taking differently: 1 each by Other route See admin instructions. Use as instructed 09/03/20   [name], MD  hydrOXYzine (VISTARIL) 25 MG capsule Take 1 capsule (25 mg total) by mouth every 8 (eight) hours as needed. 07/31/21   Georgette Shell, MD  insulin detemir (LEVEMIR FLEXTOUCH) 100 UNIT/ML FlexPen Inject 16 Units into the skin daily. 06/14/21   Shelly Coss, MD  Insulin Pen Needle (PEN NEEDLES 3/16") 31G X 5 MM MISC 1 each by Does not apply route in the morning, at noon, and at bedtime. 06/14/21   Shelly Coss, MD  insulin regular (NOVOLIN R) 100 units/mL injection Inject 0-0.15 mLs (0-15 Units total) into the skin See admin instructions. Via pump.Start this medication after you follow up with your PCP Patient taking differently: Inject 0-15 Units into the skin See admin instructions. Via pump. 06/14/21   Shelly Coss, MD  metoCLOPramide (REGLAN) 10 MG tablet Take 1 tablet (10 mg total) by mouth every 8 (eight) hours as needed for nausea. 08/19/21 09/18/21  Danford, Suann Larry, MD  oxyCODONE (OXY IR/ROXICODONE) 5 MG immediate release tablet Take 1 tablet (5 mg total) by mouth every 8 (eight) hours as needed for breakthrough pain. 08/19/21   Danford, Suann Larry, MD  pantoprazole (PROTONIX) 40 MG tablet Take 1 tablet (40 mg total) by mouth 2 (  two) times daily before a meal. 08/19/21   Danford, Suann Larry, MD  prochlorperazine (COMPAZINE) 10 MG tablet Take 0.5-1 tablets (5-10 mg total) by mouth every 8 (eight) hours as needed for nausea or vomiting. 08/19/21   Danford, Suann Larry, MD  promethazine (PHENERGAN) 12.5 MG tablet Take 1 tablet (12.5 mg total) by mouth every 6 (six) hours as needed for nausea or vomiting. 07/31/21   Georgette Shell, MD  TRUEPLUS LANCETS 28G MISC assist with checking blood sugar TID and qhs Patient taking  differently: 1 each by Other route See admin instructions. assist with checking blood sugar TID and qhs 09/10/15   Zettie Pho S, PA-C  insulin aspart (NOVOLOG) 100 UNIT/ML FlexPen Inject 5 Units into the skin 3 (three) times daily with meals. 02/23/18 07/05/19  Donne Hazel, MD      Allergies    Patient has no known allergies.    Review of Systems   Review of Systems  Constitutional:  Negative for fever.  Gastrointestinal:  Positive for abdominal pain, nausea and vomiting. Negative for constipation and diarrhea.  Genitourinary:  Negative for dysuria and hematuria.  All other systems reviewed and are negative.   Physical Exam Updated Vital Signs BP (!) 169/119   Pulse (!) 118   Temp 98.5 F (36.9 C)   Resp 10   SpO2 100%  Physical Exam Vitals and nursing note reviewed.  Constitutional:      Appearance: Normal appearance. She is ill-appearing.  HENT:     Head: Normocephalic and atraumatic.  Eyes:     Conjunctiva/sclera: Conjunctivae normal.  Cardiovascular:     Rate and Rhythm: Normal rate and regular rhythm.  Pulmonary:     Effort: Pulmonary effort is normal. No respiratory distress.     Breath sounds: Normal breath sounds.  Abdominal:     General: There is no distension.     Palpations: Abdomen is soft.     Tenderness: There is generalized abdominal tenderness.  Skin:    General: Skin is warm and dry.  Neurological:     General: No focal deficit present.     Mental Status: She is alert.     ED Results / Procedures / Treatments   Labs (all labs ordered are listed, but only abnormal results are displayed) Labs Reviewed  CBG MONITORING, ED - Abnormal; Notable for the following components:      Result Value   Glucose-Capillary 347 (*)    All other components within normal limits  I-STAT CHEM 8, ED - Abnormal; Notable for the following components:   BUN 31 (*)    Creatinine, Ser 1.20 (*)    Glucose, Bld 399 (*)    Calcium, Ion 1.13 (*)    All other components  within normal limits  BETA-HYDROXYBUTYRIC ACID  COMPREHENSIVE METABOLIC PANEL  CBC WITH DIFFERENTIAL/PLATELET  LIPASE, BLOOD  URINALYSIS, ROUTINE W REFLEX MICROSCOPIC  I-STAT BETA HCG BLOOD, ED ([facility], [facility], [facility] ONLY)    EKG EKG Interpretation  Date/Time:  Saturday August 24 2021 18:12:45 EDT Ventricular Rate:  132 PR Interval:  127 QRS Duration: 110 QT Interval:  428 QTC Calculation: 635 R Axis:   75 Text Interpretation: Sinus tachycardia LAE, consider biatrial enlargement Abnormal Q suggests anterior infarct Nonspecific T abnrm, anterolateral leads new  Prolonged QT interval Confirmed by Blanchie Dessert [phone]) on 08/24/2021 6:36:18 PM  Radiology No results found.  Procedures Procedures    Medications Ordered in ED Medications  HYDROmorphone (DILAUDID) injection 0.5 mg (has no  administration in time range)  lactated ringers bolus 1,000 mL (1,000 mLs Intravenous New Bag/Given 08/24/21 1917)  HYDROmorphone (DILAUDID) injection 0.5 mg (0.5 mg Intravenous Given 08/24/21 1917)  metoCLOPramide (REGLAN) injection 10 mg (10 mg Intravenous Given 08/24/21 1917)    ED Course/ Medical Decision Making/ A&P                           Medical Decision Making Risk Prescription drug management.   Patient is a 31 year old female who presents to the emergency department complaining of abdominal pain, nausea and vomiting since yesterday.  Has history of type 1 diabetes and gastroparesis.  Upon chart review patient was admitted to the hospital for gastroparesis, discharged on 7/3.  At that time her laboratory evaluation and imaging was relatively normal and she was specifically admitted for intractable nausea and vomiting.  She did have a CT scan that showed gastroenteritis and diffuse colitis versus nondistention.  She was treated with IV fluids, and antiemetics and her symptoms improved over 48 hours.  On exam patient is ill-appearing.  She is tachycardic to 132, afebrile.  Abdomen is soft, with  generalized tenderness to palpation.  Glucose 399. Patient required US guided IV placement for further labs. I-stat chemistry with stable kidney function compared to prior. Further labs pending at time of shift change.   EKG performed and interpreted by my attending physician which shows sinus tachycardia with QT prolongation of 635.  Patient given IV fluids, reglan and dilaudid  Patient discussed and care transferred to [facility] at shift change. Please see his/her note for further details regarding further ED course and disposition. Plan at time of handoff is await labs and reassess patient's pain/nausea to determine disposition.   Final Clinical Impression(s) / ED Diagnoses Final diagnoses:  Nausea and vomiting, unspecified vomiting type  Gastroparesis    Rx / DC Orders ED Discharge Orders     None      Portions of this report may have been transcribed using voice recognition software. Every effort was made to ensure accuracy; however, inadvertent computerized transcription errors may be present.    Estill Cotta 08/24/21 1950    Blanchie Dessert, MD 08/25/21 1316

## 2021-08-24 NOTE — ED Provider Notes (Signed)
Physical Exam  BP (!) 169/119   Pulse (!) 118   Temp 98.5 F (36.9 C)   Resp 10   SpO2 100%   Physical Exam Vitals and nursing note reviewed.  Constitutional:      Comments: Vomiting and retching  HENT:     Mouth/Throat:     Mouth: Mucous membranes are dry.  Eyes:     General: No scleral icterus. Cardiovascular:     Rate and Rhythm: Tachycardia present.  Pulmonary:     Effort: Pulmonary effort is normal. No respiratory distress.     Breath sounds: Normal breath sounds.  Abdominal:     General: Bowel sounds are normal.     Palpations: Abdomen is soft.     Tenderness: There is abdominal tenderness. There is no guarding or rebound.  Skin:    General: Skin is dry.     Findings: No rash.  Neurological:     General: No focal deficit present.     Mental Status: She is alert. Mental status is at baseline.  Psychiatric:        Mood and Affect: Mood normal.     Procedures  Procedures  ED Course / MDM    Medical Decision Making Amount and/or Complexity of Data Reviewed Labs: ordered. Radiology: ordered.  Risk Prescription drug management. Decision regarding hospitalization.   Accepted handoff at shift change from [employer], PA-C. Please see prior provider note for more detail.   Briefly: Patient is 31 y.o. F presents to the ED for evaluation of nausea, vomiting, and abdominal pain. H/o gastroparesis.  DDX: concern for intractable nausea and vomiting. Patient has been admitted for this in the past.   Plan: Follow up on labs and Re-evaluate for pain and n/v.  Labs show more starvation ketosis. Normal bicarb. Gap at 16. Normal pH. There are ketones in the urine and the patient's blood sugar is elevated. Doubt DKA given no acidosis.   The patient has had reglan, three rounds of dilaudid, and fluids and is still having pain and remaining tachycardic. Will admit to medicine. Dr. Arville Care asks that I place a CT of her abdomen, otherwise he is admitting the patient.     Results for orders placed or performed during the hospital encounter of 08/24/21  POC CBG, ED  Result Value Ref Range   Glucose-Capillary 347 (H) 70 - 99 mg/dL  I-stat chem 8, ED (not at [facility] or [facility])  Result Value Ref Range   Sodium 135 135 - 145 mmol/L   Potassium 4.6 3.5 - 5.1 mmol/L   Chloride 99 98 - 111 mmol/L   BUN 31 (H) 6 - 20 mg/dL   Creatinine, Ser 0.94 (H) 0.44 - 1.00 mg/dL   Glucose, Bld 709 (H) 70 - 99 mg/dL   Calcium, Ion 6.28 (L) 1.15 - 1.40 mmol/L   TCO2 24 22 - 32 mmol/L   Hemoglobin 12.6 12.0 - 15.0 g/dL   HCT 36.6 29.4 - 76.5 %  I-Stat Beta hCG blood, ED ([facility], [facility], [facility] only)  Result Value Ref Range   I-stat hCG, quantitative <5.0 <5 mIU/mL   Comment 3           CT ABDOMEN PELVIS W CONTRAST  Result Date: 08/24/2021 CLINICAL DATA:  Abdominal pain, acute, nonlocalized. History of gastroparesis. EXAM: CT ABDOMEN AND PELVIS WITH CONTRAST TECHNIQUE: Multidetector CT imaging of the abdomen and pelvis was performed using the standard protocol following bolus administration of intravenous contrast. RADIATION DOSE REDUCTION: This exam was  performed according to the departmental dose-optimization program which includes automated exposure control, adjustment of the mA and/or kV according to patient size and/or use of iterative reconstruction technique. CONTRAST:  OMNIPAQUE IOHEXOL 300 MG/ML  SOLN COMPARISON:  08/17/2021. FINDINGS: Lower chest: No acute abnormality. Hepatobiliary: No focal liver abnormality is seen. No gallstones, gallbladder wall thickening, or biliary dilatation. Pancreas: Unremarkable. No pancreatic ductal dilatation or surrounding inflammatory changes. Spleen: Normal in size without focal abnormality. Adrenals/Urinary Tract: Adrenal glands are unremarkable. Kidneys are normal, without renal calculi, focal lesion, or hydronephrosis. Bladder is unremarkable. Stomach/Bowel: The stomach is within normal limits. No bowel obstruction, free air, or pneumatosis. A  few scattered diverticula are present along the colon without evidence of diverticulitis. The proximal appendix is prominent in size and air-filled at 1 cm, not significantly changed from the prior exam. No surrounding inflammatory changes are identified. There is mild diffuse colonic wall thickening with surrounding fat stranding. Vascular/Lymphatic: No significant vascular findings are present. No enlarged abdominal or pelvic lymph nodes. Reproductive: Uterus and bilateral adnexa are unremarkable. Other: No abdominopelvic ascites. Musculoskeletal: No acute or significant osseous findings. IMPRESSION: 1. Mild diffuse colonic wall thickening with surrounding fat stranding suggesting colitis. 2. Prominent appendix with no surrounding inflammatory changes. Findings are similar in appearance to the prior exam and likely reflect a normal variant. Correlate clinically to exclude appendicitis. Electronically Signed   By: Thornell Sartorius M.D.   On: 08/24/2021 22:38   CT ABDOMEN PELVIS W CONTRAST  Result Date: 08/17/2021 CLINICAL DATA:  Nonlocalizing abdominal pain. EXAM: CT ABDOMEN AND PELVIS WITH CONTRAST TECHNIQUE: Multidetector CT imaging of the abdomen and pelvis was performed using the standard protocol following bolus administration of intravenous contrast. RADIATION DOSE REDUCTION: This exam was performed according to the departmental dose-optimization program which includes automated exposure control, adjustment of the mA and/or kV according to patient size and/or use of iterative reconstruction technique. CONTRAST:  OMNIPAQUE IOHEXOL 300 MG/ML  SOLN COMPARISON:  CTs with IV contrast 04/21/2021 and 06/05/2020. FINDINGS: Lower chest: No acute abnormality. Hepatobiliary: No focal liver abnormality is seen. No gallstones, gallbladder wall thickening, or biliary dilatation. Pancreas: No focal abnormality or adjacent inflammation. Spleen: Normal in size without focal abnormality. Adrenals/Urinary Tract: No renal  or adrenal mass enhancement is seen and no calculus or urinary obstruction. Distended bladder with normal wall thickness. Stomach/Bowel: Thickened folds are again noted in the stomach and jejunum. There is no small bowel obstruction or inflammation. The appendix is again noted distended and partially stool-filled with elongate course from the medial cecal base posterior inferiorly into the pelvis with the tip in the right perirectal space. The appendix measures up to 1.2 cm in the proximal to mid segment tapering to normal distally. Similar findings were noted on the prior studies and this is probably simply normal for the patient. There are no inflammatory changes. There is either wall thickening or underdistention in the ascending, transverse and descending colon. There are scattered sigmoid diverticula without inflammatory change. The rectal wall contracted and not well seen. Vascular/Lymphatic: No significant vascular findings are present. No enlarged abdominal or pelvic lymph nodes. Reproductive: Uterus and bilateral adnexa are unremarkable. Other: There is no free air, hemorrhage or fluid. There is no incarcerated hernia. There are clustered air pockets in the right mid abdominal wall probably due to a recent injection. Musculoskeletal: No acute or significant osseous findings. IMPRESSION: 1. Gastroenteritis, similar to prior studies with no small-bowel obstruction or inflammation. 2. Distended and partially stool-filled appendix but  similar to prior studies and no inflammatory change. Probable normal variant. Correlate clinically to exclude early appendicitis. 3. Ascending, transverse and descending colitis versus nondistention. Electronically Signed   By: Almira Bar M.D.   On: 08/17/2021 07:12         Achille Rich, PA-C 08/25/21 0014    Milagros Loll, MD 08/25/21 [phone]

## 2021-08-25 DIAGNOSIS — I10 Essential (primary) hypertension: Secondary | ICD-10-CM

## 2021-08-25 DIAGNOSIS — K920 Hematemesis: Secondary | ICD-10-CM | POA: Insufficient documentation

## 2021-08-25 DIAGNOSIS — K3184 Gastroparesis: Secondary | ICD-10-CM | POA: Diagnosis not present

## 2021-08-25 DIAGNOSIS — R9431 Abnormal electrocardiogram [ECG] [EKG]: Secondary | ICD-10-CM | POA: Diagnosis not present

## 2021-08-25 DIAGNOSIS — K529 Noninfective gastroenteritis and colitis, unspecified: Secondary | ICD-10-CM

## 2021-08-25 DIAGNOSIS — E785 Hyperlipidemia, unspecified: Secondary | ICD-10-CM

## 2021-08-25 DIAGNOSIS — E119 Type 2 diabetes mellitus without complications: Secondary | ICD-10-CM

## 2021-08-25 DIAGNOSIS — E108 Type 1 diabetes mellitus with unspecified complications: Secondary | ICD-10-CM

## 2021-08-25 DIAGNOSIS — R112 Nausea with vomiting, unspecified: Secondary | ICD-10-CM

## 2021-08-25 DIAGNOSIS — K219 Gastro-esophageal reflux disease without esophagitis: Secondary | ICD-10-CM

## 2021-08-25 LAB — BASIC METABOLIC PANEL
Anion gap: 11 (ref 5–15)
BUN: 29 mg/dL — ABNORMAL HIGH (ref 6–20)
CO2: 26 mmol/L (ref 22–32)
Calcium: 8.9 mg/dL (ref 8.9–10.3)
Chloride: 100 mmol/L (ref 98–111)
Creatinine, Ser: 1.26 mg/dL — ABNORMAL HIGH (ref 0.44–1.00)
GFR, Estimated: 59 mL/min — ABNORMAL LOW (ref >60)
Glucose, Bld: 318 mg/dL — ABNORMAL HIGH (ref 70–99)
Potassium: 4.3 mmol/L (ref 3.5–5.1)
Sodium: 137 mmol/L (ref 135–145)

## 2021-08-25 LAB — CBG MONITORING, ED
Glucose-Capillary: 267 mg/dL — ABNORMAL HIGH (ref 70–99)
Glucose-Capillary: 294 mg/dL — ABNORMAL HIGH (ref 70–99)
Glucose-Capillary: 212 mg/dL — ABNORMAL HIGH (ref 70–99)

## 2021-08-25 LAB — CBC
HCT: 32.1 % — ABNORMAL LOW (ref 36.0–46.0)
Hemoglobin: 10.4 g/dL — ABNORMAL LOW (ref 12.0–15.0)
MCH: 28.9 pg (ref 26.0–34.0)
MCHC: 32.4 g/dL (ref 30.0–36.0)
MCV: 89.2 fL (ref 80.0–100.0)
Platelets: 382 10*3/uL (ref 150–400)
RBC: 3.6 MIL/uL — ABNORMAL LOW (ref 3.87–5.11)
RDW: 13.2 % (ref 11.5–15.5)
WBC: 11.7 10*3/uL — ABNORMAL HIGH (ref 4.0–10.5)
nRBC: 0 % (ref 0.0–0.2)

## 2021-08-25 LAB — GLUCOSE, CAPILLARY
Glucose-Capillary: 226 mg/dL — ABNORMAL HIGH (ref 70–99)
Glucose-Capillary: 179 mg/dL — ABNORMAL HIGH (ref 70–99)

## 2021-08-25 MED ORDER — INSULIN DETEMIR 100 UNIT/ML ~~LOC~~ SOLN
16.0000 [IU] | Freq: Every day | SUBCUTANEOUS | Status: DC
  Administered 2021-08-25: 16 [IU] via SUBCUTANEOUS
  Filled 2021-08-25 (×2): qty 0.16

## 2021-08-25 MED ORDER — HYDROMORPHONE HCL 1 MG/ML IJ SOLN
0.5000 mg | INTRAMUSCULAR | Status: DC | PRN
  Administered 2021-08-25 – 2021-08-27 (×10): 0.5 mg via INTRAVENOUS
  Filled 2021-08-25: qty 1
  Filled 2021-08-25 (×9): qty 0.5

## 2021-08-25 MED ORDER — SODIUM CHLORIDE 0.9 % IV BOLUS: 500.0000 mL | Freq: Once | INTRAVENOUS | Status: DC

## 2021-08-25 MED ORDER — ALBUTEROL SULFATE (2.5 MG/3ML) 0.083% IN NEBU: 2.5000 mg | INHALATION_SOLUTION | Freq: Four times a day (QID) | RESPIRATORY_TRACT | Status: DC | PRN

## 2021-08-25 MED ORDER — PROCHLORPERAZINE MALEATE 10 MG PO TABS
5.0000 mg | ORAL_TABLET | Freq: Three times a day (TID) | ORAL | Status: DC
  Administered 2021-08-25 – 2021-08-27 (×8): 10 mg via ORAL
  Filled 2021-08-25 (×8): qty 1

## 2021-08-25 MED ORDER — INSULIN ASPART 100 UNIT/ML IJ SOLN
0.0000 [IU] | Freq: Four times a day (QID) | INTRAMUSCULAR | Status: DC
  Filled 2021-08-25: qty 0.09

## 2021-08-25 MED ORDER — HYDROXYZINE HCL 25 MG PO TABS
25.0000 mg | ORAL_TABLET | Freq: Three times a day (TID) | ORAL | Status: DC | PRN
Start: 2021-08-25 — End: 2021-08-27

## 2021-08-25 MED ORDER — METOCLOPRAMIDE HCL 5 MG/ML IJ SOLN
10.0000 mg | Freq: Three times a day (TID) | INTRAMUSCULAR | Status: DC
  Administered 2021-08-25: 10 mg via INTRAVENOUS
  Filled 2021-08-25: qty 2

## 2021-08-25 MED ORDER — LACTATED RINGERS IV BOLUS
500.0000 mL | Freq: Once | INTRAVENOUS | Status: AC
  Administered 2021-08-25: 500 mL via INTRAVENOUS

## 2021-08-25 MED ORDER — INSULIN ASPART 100 UNIT/ML IJ SOLN
0.0000 [IU] | Freq: Four times a day (QID) | INTRAMUSCULAR | Status: DC
  Administered 2021-08-25 (×2): 3 [IU] via SUBCUTANEOUS
  Administered 2021-08-25: 5 [IU] via SUBCUTANEOUS
  Administered 2021-08-25: 2 [IU] via SUBCUTANEOUS
  Administered 2021-08-25: 5 [IU] via SUBCUTANEOUS
  Administered 2021-08-26: 2 [IU] via SUBCUTANEOUS
  Administered 2021-08-26: 5 [IU] via SUBCUTANEOUS
  Administered 2021-08-26 – 2021-08-27 (×4): 3 [IU] via SUBCUTANEOUS
  Filled 2021-08-25: qty 0.09

## 2021-08-25 NOTE — Assessment & Plan Note (Signed)
Continue statin therapy.

## 2021-08-25 NOTE — Assessment & Plan Note (Addendum)
-   We will continue PPI therapy with IV Protonix.

## 2021-08-25 NOTE — ED Notes (Signed)
GI at bedside

## 2021-08-25 NOTE — Assessment & Plan Note (Signed)
-   The patient will be placed on supplemental coverage with NovoLog. - We will continue basal coverage. 

## 2021-08-25 NOTE — Consult Note (Signed)
   Consultation  Referring Provider:     Jan Mansy Primary Care Physician:  Rudd, Stephen M, MD Primary Gastroenterologist:        Dr. Suhayb Anzalone - [place] Reason for Consultation:     gastroparesis, coffee ground emesis         HPI:   Kelli Hudson is a 31 y.o. female who is an outpatient that I follow-up and I know well, history of type 1 diabetes on an insulin pump, hemoglobin A1c between 9's and 14s, known gastroparesis with gastric emptying study March 2022 showing severe gastroparesis, admitted with worsening gastroparesis and coffee-ground emesis.  Recall her detailed history from my office note on May 23.  She has had an EGD in 2021 when admitted for this issue which was normal.  She has had gallstones in her gallbladder in the past however she has no baseline biliary colic symptoms.  She had multiple imaging studies to date showing no other clear cause for her symptoms.  She has been on Reglan for some time which she thinks helps but has not controlled symptoms adequately and she has had numerous hospitalizations.  In fact she has had 3 hospitalizations over the past month for flares of her symptoms.  Recall that she does not think baseline Zofran has ever really provided much benefit, uses Phenergan as needed at home.  Her baseline regimen is Protonix 40 mg twice daily and Reglan 10 mg 3 times daily.  She does not smoke any marijuana.  At our last clinic visit I had recommended we start domperidone, as her QTc has been normal.  [place] this from [place], it was $60 for a month supply and $170 for 3-month supply, she states she could not afford it at the time, she lost her job.  I also referred her to [facility], [facility], and [facility] inquiring about their gastric pacemaker program for evaluation for that, as well as for possible pyloromyotomy.  Her case was declined at the centers, it sounds like [facility] is not placing pacemakers any longer.  She denies any baseline  narcotic use or NSAID use.  She does not use alcohol with any frequency.  She is really struggled with her symptoms.  She states she will actually feel pretty well for a few days in a row and then will have flares of her symptoms which cause severe diffuse abdominal pain, she has significant reflux and heartburn with this and then she has cycles of severe nausea and vomiting.  She has had episodic coffee-ground emesis recently.  Yesterday she had multiple episodes of what sounds like old dark blood with her vomit.  Her hemoglobin is normal, BUN is mildly elevated with her creatinine due to dehydration.  She has not had any melena or blood in her stools.  Lipase and LFTs otherwise normal.  She is very frustrated by her recurrent symptoms.  We had a good discussion about her options.  Overnight she was given antiemetics, also noted to have given some Dilaudid for her pain.  She is feeling much better today.  Actually had some sips of water this morning.  Of note in the ED she had a CT scan of her abdomen pelvis with contrast to evaluate her pain.  She has mild diffuse colonic wall thickening suggestive of possible colitis with a prominent appendix.  She had similar findings on the CT scan just a week ago.  She had 3 CT scans this year without clear cause   for symptoms.  She has absolutely no diarrhea at baseline.  No constipation at baseline.  No blood in her stools.  She has never had a colonoscopy.  Of note she had multiple EKGs in the past few days, most of which showed normal QTc.  Her last EKG last night showed a new prolonged QTc to the 600s. It was repeated after discussion with nursing in the ED and remains prolonged at 508.  Past Medical History:  Diagnosis Date   Acute H. pylori gastric ulcer    Diabetes mellitus (HCC)    Diabetic gastroparesis (HCC)    Gastroparesis    GERD (gastroesophageal reflux disease)    Hypertension    Hyperthyroidism     Past Surgical History:  Procedure  Laterality Date   AMPUTATION TOE Left 03/10/2018   Procedure: AMPUTATION FIFTH TOE;  Surgeon: Wagoner, Matthew R, DPM;  Location: [facility];  Service: Podiatry;  Laterality: Left;   BIOPSY  01/28/2020   Procedure: BIOPSY;  Surgeon: Brahmbhatt, Parag, MD;  Location: [facility];  Service: Gastroenterology;;   ESOPHAGOGASTRODUODENOSCOPY N/A 01/28/2020   Procedure: ESOPHAGOGASTRODUODENOSCOPY (EGD);  Surgeon: Brahmbhatt, Parag, MD;  Location: [facility];  Service: Gastroenterology;  Laterality: N/A;   ESOPHAGOGASTRODUODENOSCOPY (EGD) WITH PROPOFOL Left 09/08/2015   Procedure: ESOPHAGOGASTRODUODENOSCOPY (EGD) WITH PROPOFOL;  Surgeon: William Outlaw, MD;  Location: [facility];  Service: Endoscopy;  Laterality: Left;   WISDOM TOOTH EXTRACTION      Family History  Problem Relation Age of Onset   Lung cancer Mother    Diabetes Mother    Bipolar disorder Father    Liver cancer Maternal Grandfather    Diabetes Maternal Aunt        x 2   Pancreatic cancer Maternal Uncle    Prostate cancer Maternal Uncle    Breast cancer Other        maternal great aunt   Heart disease Other    Ovarian cancer Other        maternal great aunt   Kidney disease Other        maternal great aunt   Colon cancer Neg Hx    Stomach cancer Neg Hx      Social History   Tobacco Use   Smoking status: Never   Smokeless tobacco: Never  Vaping Use   Vaping Use: Never used  Substance Use Topics   Alcohol use: Yes    Alcohol/week: 1.0 standard drink of alcohol    Types: 1 Shots of liquor per week    Comment: occasional    Drug use: No    Prior to Admission medications   Medication Sig Start Date End Date Taking? Authorizing Provider  albuterol (VENTOLIN HFA) 108 (90 Base) MCG/ACT inhaler Inhale 2 puffs into the lungs every 6 (six) hours as needed for shortness of breath.   Yes [provider]  atorvastatin (LIPITOR) 20 MG tablet Take 1 tablet (20 mg total) by mouth daily. 04/10/21  Yes  Rudd, Stephen M, MD  carvedilol (COREG) 6.25 MG tablet Take 1 tablet (6.25 mg total) by mouth 2 (two) times daily with a meal. 07/01/21  Yes Samtani, Jai-Gurmukh, MD  dicyclomine (BENTYL) 20 MG tablet Take 1 tablet (20 mg total) by mouth 3 (three) times daily as needed for spasms. 07/31/21  Yes Mathews, Elizabeth G, MD  hydrOXYzine (VISTARIL) 25 MG capsule Take 1 capsule (25 mg total) by mouth every 8 (eight) hours as needed. Patient taking differently: Take 25 mg by mouth every 8 (eight)   hours as needed for anxiety. 07/31/21  Yes Mathews, Elizabeth G, MD  insulin detemir (LEVEMIR FLEXTOUCH) 100 UNIT/ML FlexPen Inject 16 Units into the skin daily. Patient taking differently: Inject 18 Units into the skin daily. 06/14/21  Yes Adhikari, Amrit, MD  insulin regular (NOVOLIN R) 100 units/mL injection Inject 0-0.15 mLs (0-15 Units total) into the skin See admin instructions. Via pump.Start this medication after you follow up with your PCP Patient taking differently: Inject 0-15 Units into the skin See admin instructions. Via pump. 06/14/21  Yes Adhikari, Amrit, MD  metoCLOPramide (REGLAN) 10 MG tablet Take 1 tablet (10 mg total) by mouth every 8 (eight) hours as needed for nausea. 08/19/21 09/18/21 Yes Danford, Christopher P, MD  pantoprazole (PROTONIX) 40 MG tablet Take 1 tablet (40 mg total) by mouth 2 (two) times daily before a meal. 08/19/21  Yes Danford, Christopher P, MD  prochlorperazine (COMPAZINE) 10 MG tablet Take 0.5-1 tablets (5-10 mg total) by mouth every 8 (eight) hours as needed for nausea or vomiting. 08/19/21  Yes Danford, Christopher P, MD  promethazine (PHENERGAN) 12.5 MG tablet Take 1 tablet (12.5 mg total) by mouth every 6 (six) hours as needed for nausea or vomiting. 07/31/21  Yes Mathews, Elizabeth G, MD  blood glucose meter kit and supplies Dispense based on patient and insurance preference. Use up to four times daily as directed. (FOR ICD-10 E10.9, E11.9). 06/14/21   Adhikari, Amrit, MD  blood  glucose meter kit and supplies Dispense based on patient and insurance preference. Use up to four times daily as directed. (FOR ICD-10 E10.9, E11.9). 07/31/21   Mathews, Elizabeth G, MD  Continuous Blood Gluc Sensor (DEXCOM G6 SENSOR) MISC 1 each by Does not apply route daily. 11/21/20   Rudd, Stephen M, MD  Continuous Blood Gluc Transmit (DEXCOM G6 TRANSMITTER) MISC 1 each by Does not apply route daily. 09/24/20   Rudd, Stephen M, MD  glucose blood (TRUE METRIX BLOOD GLUCOSE TEST) test strip Use as instructed Patient taking differently: 1 each by Other route See admin instructions. Use as instructed 09/03/20   Rudd, Stephen M, MD  Insulin Pen Needle (PEN NEEDLES 3/16") 31G X 5 MM MISC 1 each by Does not apply route in the morning, at noon, and at bedtime. 06/14/21   Adhikari, Amrit, MD  oxyCODONE (OXY IR/ROXICODONE) 5 MG immediate release tablet Take 1 tablet (5 mg total) by mouth every 8 (eight) hours as needed for breakthrough pain. Patient not taking: Reported on 08/25/2021 08/19/21   Danford, Christopher P, MD  TRUEPLUS LANCETS 28G MISC assist with checking blood sugar TID and qhs Patient taking differently: 1 each by Other route See admin instructions. assist with checking blood sugar TID and qhs 09/10/15   Noel, Tiffany S, PA-C  insulin aspart (NOVOLOG) 100 UNIT/ML FlexPen Inject 5 Units into the skin 3 (three) times daily with meals. 02/23/18 07/05/19  Chiu, Stephen K, MD    Current Facility-Administered Medications  Medication Dose Route Frequency Provider Last Rate Last Admin   0.9 %  sodium chloride infusion   Intravenous Continuous Mansy, Jan A, MD 100 mL/hr at 08/25/21 0136 New Bag at 08/25/21 0136   acetaminophen (TYLENOL) tablet 650 mg  650 mg Oral Q6H PRN Mansy, Jan A, MD       Or   acetaminophen (TYLENOL) suppository 650 mg  650 mg Rectal Q6H PRN Mansy, Jan A, MD       albuterol (PROVENTIL) (2.5 MG/3ML) 0.083% nebulizer solution 2.5 mg  2.5 mg Nebulization   Q6H PRN Mansy, Jan A, MD        atorvastatin (LIPITOR) tablet 20 mg  20 mg Oral Daily Mansy, Jan A, MD       carvedilol (COREG) tablet 6.25 mg  6.25 mg Oral BID WC Mansy, Jan A, MD       dicyclomine (BENTYL) tablet 20 mg  20 mg Oral TID PRN Mansy, Jan A, MD       HYDROmorphone (DILAUDID) injection 0.5 mg  0.5 mg Intravenous Q4H PRN Caren Griffins, MD       hydrOXYzine (ATARAX) tablet 25 mg  25 mg Oral Q8H PRN Mansy, Jan A, MD       insulin aspart (novoLOG) injection 0-9 Units  0-9 Units Subcutaneous QID Caren Griffins, MD   5 Units at 08/25/21 0800   insulin detemir (LEVEMIR) injection 16 Units  16 Units Subcutaneous Daily Caren Griffins, MD   16 Units at 08/25/21 0800   magnesium hydroxide (MILK OF MAGNESIA) suspension 30 mL  30 mL Oral Daily PRN Mansy, Jan A, MD       metoCLOPramide (REGLAN) injection 10 mg  10 mg Intravenous Q8H Caren Griffins, MD   10 mg at 08/25/21 0808   ondansetron (ZOFRAN) tablet 4 mg  4 mg Oral Q6H PRN Mansy, Jan A, MD       Or   ondansetron [facility]) injection 4 mg  4 mg Intravenous Q6H PRN Mansy, Jan A, MD       oxyCODONE (Oxy IR/ROXICODONE) immediate release tablet 5 mg  5 mg Oral Q8H PRN Mansy, Jan A, MD   5 mg at 08/25/21 0615   [START ON 08/28/2021] pantoprazole (PROTONIX) injection 40 mg  40 mg Intravenous Q12H Mansy, Jan A, MD       pantoprozole (PROTONIX) 80 mg /NS 100 mL infusion  8 mg/hr Intravenous Continuous Mansy, Jan A, MD 10 mL/hr at 08/25/21 0137 8 mg/hr at 08/25/21 6333   prochlorperazine (COMPAZINE) tablet 5-10 mg  5-10 mg Oral Q8H PRN Mansy, Jan A, MD       promethazine (PHENERGAN) tablet 12.5 mg  12.5 mg Oral Q6H PRN Mansy, Jan A, MD       sodium chloride 0.9 % bolus 500 mL  500 mL Intravenous Once Mansy, Jan A, MD       traZODone (DESYREL) tablet 25 mg  25 mg Oral QHS PRN Mansy, Arvella Merles, MD       Current Outpatient Medications  Medication Sig Dispense Refill   albuterol (VENTOLIN HFA) 108 (90 Base) MCG/ACT inhaler Inhale 2 puffs into the lungs every 6 (six) hours as  needed for shortness of breath.     atorvastatin (LIPITOR) 20 MG tablet Take 1 tablet (20 mg total) by mouth daily. 90 tablet 3   carvedilol (COREG) 6.25 MG tablet Take 1 tablet (6.25 mg total) by mouth 2 (two) times daily with a meal. 30 tablet 2   dicyclomine (BENTYL) 20 MG tablet Take 1 tablet (20 mg total) by mouth 3 (three) times daily as needed for spasms. 30 tablet 0   hydrOXYzine (VISTARIL) 25 MG capsule Take 1 capsule (25 mg total) by mouth every 8 (eight) hours as needed. (Patient taking differently: Take 25 mg by mouth every 8 (eight) hours as needed for anxiety.) 30 capsule 0   insulin detemir (LEVEMIR FLEXTOUCH) 100 UNIT/ML FlexPen Inject 16 Units into the skin daily. (Patient taking differently: Inject 18 Units into the skin daily.) 10 mL 0   insulin  regular (NOVOLIN R) 100 units/mL injection Inject 0-0.15 mLs (0-15 Units total) into the skin See admin instructions. Via pump.Start this medication after you follow up with your PCP (Patient taking differently: Inject 0-15 Units into the skin See admin instructions. Via pump.) 30 mL 1   metoCLOPramide (REGLAN) 10 MG tablet Take 1 tablet (10 mg total) by mouth every 8 (eight) hours as needed for nausea. 45 tablet 1   pantoprazole (PROTONIX) 40 MG tablet Take 1 tablet (40 mg total) by mouth 2 (two) times daily before a meal. 60 tablet 2   prochlorperazine (COMPAZINE) 10 MG tablet Take 0.5-1 tablets (5-10 mg total) by mouth every 8 (eight) hours as needed for nausea or vomiting. 30 tablet 1   promethazine (PHENERGAN) 12.5 MG tablet Take 1 tablet (12.5 mg total) by mouth every 6 (six) hours as needed for nausea or vomiting. 30 tablet 0   blood glucose meter kit and supplies Dispense based on patient and insurance preference. Use up to four times daily as directed. (FOR ICD-10 E10.9, E11.9). 1 each 0   blood glucose meter kit and supplies Dispense based on patient and insurance preference. Use up to four times daily as directed. (FOR ICD-10 E10.9,  E11.9). 1 each 0   Continuous Blood Gluc Sensor (DEXCOM G6 SENSOR) MISC 1 each by Does not apply route daily. 3 each 12   Continuous Blood Gluc Transmit (DEXCOM G6 TRANSMITTER) MISC 1 each by Does not apply route daily. 1 each 3   glucose blood (TRUE METRIX BLOOD GLUCOSE TEST) test strip Use as instructed (Patient taking differently: 1 each by Other route See admin instructions. Use as instructed) 100 each 12   Insulin Pen Needle (PEN NEEDLES 3/16") 31G X 5 MM MISC 1 each by Does not apply route in the morning, at noon, and at bedtime. 100 each 0   oxyCODONE (OXY IR/ROXICODONE) 5 MG immediate release tablet Take 1 tablet (5 mg total) by mouth every 8 (eight) hours as needed for breakthrough pain. (Patient not taking: Reported on 08/25/2021) 9 tablet 0   TRUEPLUS LANCETS 28G MISC assist with checking blood sugar TID and qhs (Patient taking differently: 1 each by Other route See admin instructions. assist with checking blood sugar TID and qhs) 100 each 3    Allergies as of 08/24/2021   (No Known Allergies)     Review of Systems:    As per HPI, otherwise negative    Physical Exam:  Vital signs in last 24 hours: Temp:  [98.5 F (36.9 C)] 98.5 F (36.9 C) (07/08 1653) Pulse Rate:  [103-136] 104 (07/09 0730) Resp:  [9-22] 11 (07/09 0730) BP: (84-207)/(49-137) 114/68 (07/09 0730) SpO2:  [97 %-100 %] 97 % (07/09 0730)   General:   Pleasant female lying in bed Head:  Normocephalic and atraumatic. Eyes:   No icterus.   Conjunctiva pink. Ears:  Normal auditory acuity. Neck:  Supple Lungs:  Respirations even and unlabored. Lungs clear to auscultation bilaterally.   Heart:  Regular rate and rhythm; no MRG Abdomen:  Soft, nondistended, nontender. Normal bowel sounds. No appreciable masses or hepatomegaly.  Rectal:  Not performed.  Msk:  Symmetrical without gross deformities.  Extremities:  Without edema. Neurologic:  Alert and  oriented x4;  grossly normal neurologically. Skin:  Intact without  significant lesions or rashes. Psych:  Alert and cooperative. Normal affect.  LAB RESULTS: Recent Labs    08/24/21 1858 08/24/21 1916 08/25/21 0500  WBC  --  9.9 11.7*  HGB 12.6 12.0 10.4*  HCT 37.0 36.7 32.1*  PLT  --  450* 382   BMET Recent Labs    08/24/21 1858 08/24/21 1916  NA 135 136  K 4.6 4.1  CL 99 98  CO2  --  22  GLUCOSE 399* 383*  BUN 31* 27*  CREATININE 1.20* 1.21*  CALCIUM  --  9.7   LFT Recent Labs    08/24/21 1916  PROT 8.2*  ALBUMIN 3.6  AST 20  ALT 19  ALKPHOS 93  BILITOT 1.3*   PT/INR No results for input(s): "LABPROT", "INR" in the last 72 hours.  STUDIES: CT ABDOMEN PELVIS W CONTRAST  Result Date: 08/24/2021 CLINICAL DATA:  Abdominal pain, acute, nonlocalized. History of gastroparesis. EXAM: CT ABDOMEN AND PELVIS WITH CONTRAST TECHNIQUE: Multidetector CT imaging of the abdomen and pelvis was performed using the standard protocol following bolus administration of intravenous contrast. RADIATION DOSE REDUCTION: This exam was performed according to the departmental dose-optimization program which includes automated exposure control, adjustment of the mA and/or kV according to patient size and/or use of iterative reconstruction technique. CONTRAST:  100mL OMNIPAQUE IOHEXOL 300 MG/ML  SOLN COMPARISON:  08/17/2021. FINDINGS: Lower chest: No acute abnormality. Hepatobiliary: No focal liver abnormality is seen. No gallstones, gallbladder wall thickening, or biliary dilatation. Pancreas: Unremarkable. No pancreatic ductal dilatation or surrounding inflammatory changes. Spleen: Normal in size without focal abnormality. Adrenals/Urinary Tract: Adrenal glands are unremarkable. Kidneys are normal, without renal calculi, focal lesion, or hydronephrosis. Bladder is unremarkable. Stomach/Bowel: The stomach is within normal limits. No bowel obstruction, free air, or pneumatosis. A few scattered diverticula are present along the colon without evidence of diverticulitis.  The proximal appendix is prominent in size and air-filled at 1 cm, not significantly changed from the prior exam. No surrounding inflammatory changes are identified. There is mild diffuse colonic wall thickening with surrounding fat stranding. Vascular/Lymphatic: No significant vascular findings are present. No enlarged abdominal or pelvic lymph nodes. Reproductive: Uterus and bilateral adnexa are unremarkable. Other: No abdominopelvic ascites. Musculoskeletal: No acute or significant osseous findings. IMPRESSION: 1. Mild diffuse colonic wall thickening with surrounding fat stranding suggesting colitis. 2. Prominent appendix with no surrounding inflammatory changes. Findings are similar in appearance to the prior exam and likely reflect a normal variant. Correlate clinically to exclude appendicitis. Electronically Signed   By: Laura  Taylor M.D.   On: 08/24/2021 22:38       Impression / Plan:   31-year-old female readmitted with worsening gastroparesis and some scant coffee-ground emesis with a normal hemoglobin and no significant GI bleeding.    Gastroparesis Abdominal pain Coffee ground emesis Prolonged QT interval on EKG  This is her now fourth hospitalization within the past 2 months.  Extensive evaluation as outlined above.  She has severe gastroparesis in the setting of poorly controlled diabetes which is causing her symptoms.  Multiple imaging studies with CT scans have not shown another clear source.  I do not think the colonic findings on CT scan are causing her problems, she may warrant a colonoscopy at some point in time to ensure everything is okay however certainly cannot tolerate that in her current state. She does have some gallstones but she has no baseline biliary colic symptoms, I do not think gallstones are causing this problem.  Clearly Reglan is not providing baseline benefit for her to keep her out of the hospital.  We had discussed other options.  I had recommended a trial of  domperidone as the next step in   her therapy as she has never tried this.  We had discussed this in the past, her EKG looked okay at the time, but she was not willing to pay $60 for a 1 month trial.  Her QT interval was normal on multiple EKGs however her last EKG overnight showed prolonged which is new.  This was repeated and it remains prolonged at this morning but much better than last night.  Unclear if one of the nausea medicine she got played a role in this as she received several.  This unfortunately definitely changes her inpatient plan.  With prolonged QT will need to hold Reglan, stop Zofran, stop Phenergan.  I was planning on giving her IV erythromycin but cannot give her that either right now.  We will plan on repeating an EKG later today to see if this continues to improve.  This will need to be monitored closely.  She can have Compazine as that should be safe with prolonged QT.  Alternatively I will give her some Motegrity which has been shown to improve gastric emptying and should not prolong her QTc.  Longer-term, as outpatient, if her QTc normalizes, we can consider domperidone if she is willing to pay the $60 to try it, but will need to be closely monitored.  If her QTc normalizes we will need to be on some sort of baseline antiemetic as well which she is not taking. Ultimately as an outpatient given her severe gastroparesis and multiple hospitalizations I think she would be a candidate for gastric pacemaker or pyloromyotomy.  I have referred her to [facility], [facility], [facility], her case has been declined all centers.  We will need to reach back out to [facility] to see if they have discontinued their gastric pacemaker program, which we have sent patients to in the past.  We will work on this for her outpatient care. I had recommended acupuncture therapy which has been shown to help with refractory gastroparesis, she never made this appointment and recommend she do so as an outpatient.  There is no  role for Botox injection for gastroparesis at this point, it is not recommended per ACG guideline management.  Acutely while she is in the hospital, I was going to give her some IV erythromycin to help with her motility but with her prolonged QTc we cannot give her that now.  We can give her some prucalopride (motegrity) for which there is some evidence to help with gastroparesis and it should not prolong her QTc.  Ideally we could keep her on this as outpatient however I doubt it would be covered by her insurance and likely cost prohibitive. I told her otherwise I really want to minimize her narcotic use, she does not take these as outpatient but was given to her for severe pain overnight.  She is agreeable to try to come off narcotics if at all possible.  She is feeling better today, would give clear liquid diet today.    Regarding her coffee-ground emesis, I suspect this may be from esophagitis or superficial mucosal trauma in the setting of recurrent vomiting.  Her hemoglobin is normal.  She is had no significant bleeding.  We had discussed doing an EGD to further evaluate this given its been a few years since her last exam.  Unfortunately there is no room in the endoscopy suite today, we will tentatively add her to the schedule for tomorrow afternoon with Dr. Jacobs. She is in agreement with the plan.  Recommend: - repeat   EKG unfortunately confirms prolonged QTc interval right now which significant influences her management, need to stop the following: Reglan, Zofran, phenergan. Cannot use erythromycin - avoid any other drugs that prolong QTc  - repeat EKG later today, will need close monitoring of this - continue compazine - continue PPI - start prucalopride 2mg daily if available as inpatient (alternative regimen given prolonged QTc) - outpatient tertiary care center referral for consideration for gastric pacemaker or pyloromyotomy - outpatient referral for acupuncture - close follow up with  endocrine for her diabetes which is poorly controlled and making gastroparesis worse - possible EGD tomorrow with Dr. Jacobs (no room in Endo today) - clear liquid diet today as tolerated - possible colonoscopy as outpatient to ensure no concerning pathology in regards to CT findings. She cannot tolerate a bowel prep now.  GI service to follow, call with questions. Dr. Jacobs to assume her care tomorrow, I will follow up with her as outpatient following discharge  Steve Leilyn Frayre, MD [facility]       

## 2021-08-25 NOTE — H&P (View-Only) (Signed)
Consultation  Referring Provider:     Eugenie Norrie Primary Care Physician:  [name], MD Primary Gastroenterologist:        Dr. Frances Furbish Reason for Consultation:     gastroparesis, coffee ground emesis         HPI:   Kelli Hudson is a 31 y.o. female who is an outpatient that I follow-up and I know well, history of type 1 diabetes on an insulin pump, hemoglobin A1c between 9's and 14s, known gastroparesis with gastric emptying study March 2022 showing severe gastroparesis, admitted with worsening gastroparesis and coffee-ground emesis.  Recall her detailed history from my office note on May 23.  She has had an EGD in 2021 when admitted for this issue which was normal.  She has had gallstones in her gallbladder in the past however she has no baseline biliary colic symptoms.  She had multiple imaging studies to date showing no other clear cause for her symptoms.  She has been on Reglan for some time which she thinks helps but has not controlled symptoms adequately and she has had numerous hospitalizations.  In fact she has had 3 hospitalizations over the past month for flares of her symptoms.  Recall that she does not think baseline Zofran has ever really provided much benefit, uses Phenergan as needed at home.  Her baseline regimen is Protonix 40 mg twice daily and Reglan 10 mg 3 times daily.  She does not smoke any marijuana.  At our last clinic visit I had recommended we start domperidone, as her QTc has been normal.  We will order this from a [facility], it was $60 for a month supply and $170 for 61-monthsupply, she states she could not afford it at the time, she lost her job.  [facility] for evaluation for that, as well as for possible pyloromyotomy.  Her case was declined at the centers, [facility]  She denies any baseline  narcotic use or NSAID use.  She does not use alcohol with any frequency.  She is really struggled with her symptoms.  She states she will actually feel pretty well for a few days in a row and then will have flares of her symptoms which cause severe diffuse abdominal pain, she has significant reflux and heartburn with this and then she has cycles of severe nausea and vomiting.  She has had episodic coffee-ground emesis recently.  Yesterday she had multiple episodes of what sounds like old dark blood with her vomit.  Her hemoglobin is normal, BUN is mildly elevated with her creatinine due to dehydration.  She has not had any melena or blood in her stools.  Lipase and LFTs otherwise normal.  She is very frustrated by her recurrent symptoms.  We had a good discussion about her options.  Overnight she was given antiemetics, also noted to have given some Dilaudid for her pain.  She is feeling much better today.  Actually had some sips of water this morning.  Of note in the ED she had a CT scan of her abdomen pelvis with contrast to evaluate her pain.  She has mild diffuse colonic wall thickening suggestive of possible colitis with a prominent appendix.  She had similar findings on the CT scan just a week ago.  She had 3 CT scans this year without clear cause  for symptoms.  She has absolutely no diarrhea at baseline.  No constipation at baseline.  No blood in her stools.  She has never had a colonoscopy.  Of note she had multiple EKGs in the past few days, most of which showed normal QTc.  Her last EKG last night showed a new prolonged QTc to the 600s. It was repeated after discussion with nursing in the ED and remains prolonged at 508.  Past Medical History:  Diagnosis Date   Acute H. pylori gastric ulcer    Diabetes mellitus ([place])    Diabetic gastroparesis ([place])    Gastroparesis    GERD (gastroesophageal reflux disease)    Hypertension    Hyperthyroidism     Past Surgical History:  Procedure  Laterality Date   AMPUTATION TOE Left 03/10/2018   Procedure: AMPUTATION FIFTH TOE;  Surgeon: Trula Slade, DPM;  Location: [place];  Service: Podiatry;  Laterality: Left;   BIOPSY  01/28/2020   Procedure: BIOPSY;  Surgeon: Otis Brace, MD;  Location: [facility];  Service: Gastroenterology;;   ESOPHAGOGASTRODUODENOSCOPY N/A 01/28/2020   Procedure: ESOPHAGOGASTRODUODENOSCOPY (EGD);  Surgeon: Otis Brace, MD;  Location: [facility];  Service: Gastroenterology;  Laterality: N/A;   ESOPHAGOGASTRODUODENOSCOPY (EGD) WITH PROPOFOL Left 09/08/2015   Procedure: ESOPHAGOGASTRODUODENOSCOPY (EGD) WITH PROPOFOL;  Surgeon: Arta Silence, MD;  [facility];  Service: Endoscopy;  Laterality: Left;   WISDOM TOOTH EXTRACTION      Family History  Problem Relation Age of Onset   Lung cancer Mother    Diabetes Mother    Bipolar disorder Father    Liver cancer Maternal Grandfather    Diabetes Maternal Aunt        x 2   Pancreatic cancer Maternal Uncle    Prostate cancer Maternal Uncle    Breast cancer Other        maternal great aunt   Heart disease Other    Ovarian cancer Other        maternal great aunt   Kidney disease Other        maternal great aunt   Colon cancer Neg Hx    Stomach cancer Neg Hx      Social History   Tobacco Use   Smoking status: Never   Smokeless tobacco: Never  Vaping Use   Vaping Use: Never used  Substance Use Topics   Alcohol use: Yes    Alcohol/week: 1.0 standard drink of alcohol    Types: 1 Shots of liquor per week    Comment: occasional    Drug use: No    Prior to Admission medications   Medication Sig Start Date End Date Taking? Authorizing Provider  albuterol (VENTOLIN HFA) 108 (90 Base) MCG/ACT inhaler Inhale 2 puffs into the lungs every 6 (six) hours as needed for shortness of breath.   Yes [provider]  atorvastatin (LIPITOR) 20 MG tablet Take 1 tablet (20 mg total) by mouth daily. 04/10/21  Yes  [name], MD  carvedilol (COREG) 6.25 MG tablet Take 1 tablet (6.25 mg total) by mouth 2 (two) times daily with a meal. 07/01/21  Yes Nita Sells, MD  dicyclomine (BENTYL) 20 MG tablet Take 1 tablet (20 mg total) by mouth 3 (three) times daily as needed for spasms. 07/31/21  Yes Georgette Shell, MD  hydrOXYzine (VISTARIL) 25 MG capsule Take 1 capsule (25 mg total) by mouth every 8 (eight) hours as needed. Patient taking differently: Take 25 mg by mouth every 8 (eight)  hours as needed for anxiety. 07/31/21  Yes Georgette Shell, MD  insulin detemir (LEVEMIR FLEXTOUCH) 100 UNIT/ML FlexPen Inject 16 Units into the skin daily. Patient taking differently: Inject 18 Units into the skin daily. 06/14/21  Yes Shelly Coss, MD  insulin regular (NOVOLIN R) 100 units/mL injection Inject 0-0.15 mLs (0-15 Units total) into the skin See admin instructions. Via pump.Start this medication after you follow up with your PCP Patient taking differently: Inject 0-15 Units into the skin See admin instructions. Via pump. 06/14/21  Yes Shelly Coss, MD  metoCLOPramide (REGLAN) 10 MG tablet Take 1 tablet (10 mg total) by mouth every 8 (eight) hours as needed for nausea. 08/19/21 09/18/21 Yes Danford, Suann Larry, MD  pantoprazole (PROTONIX) 40 MG tablet Take 1 tablet (40 mg total) by mouth 2 (two) times daily before a meal. 08/19/21  Yes Danford, Suann Larry, MD  prochlorperazine (COMPAZINE) 10 MG tablet Take 0.5-1 tablets (5-10 mg total) by mouth every 8 (eight) hours as needed for nausea or vomiting. 08/19/21  Yes Danford, Suann Larry, MD  promethazine (PHENERGAN) 12.5 MG tablet Take 1 tablet (12.5 mg total) by mouth every 6 (six) hours as needed for nausea or vomiting. 07/31/21  Yes Georgette Shell, MD  blood glucose meter kit and supplies Dispense based on patient and insurance preference. Use up to four times daily as directed. (FOR ICD-10 E10.9, E11.9). 06/14/21   Shelly Coss, MD  blood  glucose meter kit and supplies Dispense based on patient and insurance preference. Use up to four times daily as directed. (FOR ICD-10 E10.9, E11.9). 07/31/21   Georgette Shell, MD  Continuous Blood Gluc Sensor (DEXCOM G6 SENSOR) MISC 1 each by Does not apply route daily. 11/21/20   [name], MD  Continuous Blood Gluc Transmit (DEXCOM G6 TRANSMITTER) MISC 1 each by Does not apply route daily. 09/24/20   [name], MD  glucose blood (TRUE METRIX BLOOD GLUCOSE TEST) test strip Use as instructed Patient taking differently: 1 each by Other route See admin instructions. Use as instructed 09/03/20   [name], MD  Insulin Pen Needle (PEN NEEDLES 3/16") 31G X 5 MM MISC 1 each by Does not apply route in the morning, at noon, and at bedtime. 06/14/21   Shelly Coss, MD  oxyCODONE (OXY IR/ROXICODONE) 5 MG immediate release tablet Take 1 tablet (5 mg total) by mouth every 8 (eight) hours as needed for breakthrough pain. Patient not taking: Reported on 08/25/2021 08/19/21   Edwin Dada, MD  TRUEPLUS LANCETS 28G MISC assist with checking blood sugar TID and qhs Patient taking differently: 1 each by Other route See admin instructions. assist with checking blood sugar TID and qhs 09/10/15   Zettie Pho S, PA-C  insulin aspart (NOVOLOG) 100 UNIT/ML FlexPen Inject 5 Units into the skin 3 (three) times daily with meals. 02/23/18 07/05/19  Donne Hazel, MD    Current Facility-Administered Medications  Medication Dose Route Frequency Provider Last Rate Last Admin   0.9 %  sodium chloride infusion   Intravenous Continuous Mansy, Jan A, MD 100 mL/hr at 08/25/21 0136 New Bag at 08/25/21 0136   acetaminophen (TYLENOL) tablet 650 mg  650 mg Oral Q6H PRN Mansy, Jan A, MD       Or   acetaminophen (TYLENOL) suppository 650 mg  650 mg Rectal Q6H PRN Mansy, Jan A, MD       albuterol (PROVENTIL) (2.5 MG/3ML) 0.083% nebulizer solution 2.5 mg  2.5 mg Nebulization  Q6H PRN Mansy, Jan A, MD        atorvastatin (LIPITOR) tablet 20 mg  20 mg Oral Daily Mansy, Jan A, MD       carvedilol (COREG) tablet 6.25 mg  6.25 mg Oral BID WC Mansy, Jan A, MD       dicyclomine (BENTYL) tablet 20 mg  20 mg Oral TID PRN Mansy, Jan A, MD       HYDROmorphone (DILAUDID) injection 0.5 mg  0.5 mg Intravenous Q4H PRN Caren Griffins, MD       hydrOXYzine (ATARAX) tablet 25 mg  25 mg Oral Q8H PRN Mansy, Jan A, MD       insulin aspart (novoLOG) injection 0-9 Units  0-9 Units Subcutaneous QID Caren Griffins, MD   5 Units at 08/25/21 0800   insulin detemir (LEVEMIR) injection 16 Units  16 Units Subcutaneous Daily Caren Griffins, MD   16 Units at 08/25/21 0800   magnesium hydroxide (MILK OF MAGNESIA) suspension 30 mL  30 mL Oral Daily PRN Mansy, Jan A, MD       metoCLOPramide (REGLAN) injection 10 mg  10 mg Intravenous Q8H Caren Griffins, MD   10 mg at 08/25/21 0808   ondansetron (ZOFRAN) tablet 4 mg  4 mg Oral Q6H PRN Mansy, Jan A, MD       Or   ondansetron [facility]) injection 4 mg  4 mg Intravenous Q6H PRN Mansy, Jan A, MD       oxyCODONE (Oxy IR/ROXICODONE) immediate release tablet 5 mg  5 mg Oral Q8H PRN Mansy, Jan A, MD   5 mg at 08/25/21 0615   [START ON 08/28/2021] pantoprazole (PROTONIX) injection 40 mg  40 mg Intravenous Q12H Mansy, Jan A, MD       pantoprozole (PROTONIX) 80 mg /NS 100 mL infusion  8 mg/hr Intravenous Continuous Mansy, Jan A, MD 10 mL/hr at 08/25/21 0137 8 mg/hr at 08/25/21 6333   prochlorperazine (COMPAZINE) tablet 5-10 mg  5-10 mg Oral Q8H PRN Mansy, Jan A, MD       promethazine (PHENERGAN) tablet 12.5 mg  12.5 mg Oral Q6H PRN Mansy, Jan A, MD       sodium chloride 0.9 % bolus 500 mL  500 mL Intravenous Once Mansy, Jan A, MD       traZODone (DESYREL) tablet 25 mg  25 mg Oral QHS PRN Mansy, Arvella Merles, MD       Current Outpatient Medications  Medication Sig Dispense Refill   albuterol (VENTOLIN HFA) 108 (90 Base) MCG/ACT inhaler Inhale 2 puffs into the lungs every 6 (six) hours as  needed for shortness of breath.     atorvastatin (LIPITOR) 20 MG tablet Take 1 tablet (20 mg total) by mouth daily. 90 tablet 3   carvedilol (COREG) 6.25 MG tablet Take 1 tablet (6.25 mg total) by mouth 2 (two) times daily with a meal. 30 tablet 2   dicyclomine (BENTYL) 20 MG tablet Take 1 tablet (20 mg total) by mouth 3 (three) times daily as needed for spasms. 30 tablet 0   hydrOXYzine (VISTARIL) 25 MG capsule Take 1 capsule (25 mg total) by mouth every 8 (eight) hours as needed. (Patient taking differently: Take 25 mg by mouth every 8 (eight) hours as needed for anxiety.) 30 capsule 0   insulin detemir (LEVEMIR FLEXTOUCH) 100 UNIT/ML FlexPen Inject 16 Units into the skin daily. (Patient taking differently: Inject 18 Units into the skin daily.) 10 mL 0   insulin  regular (NOVOLIN R) 100 units/mL injection Inject 0-0.15 mLs (0-15 Units total) into the skin See admin instructions. Via pump.Start this medication after you follow up with your PCP (Patient taking differently: Inject 0-15 Units into the skin See admin instructions. Via pump.) 30 mL 1   metoCLOPramide (REGLAN) 10 MG tablet Take 1 tablet (10 mg total) by mouth every 8 (eight) hours as needed for nausea. 45 tablet 1   pantoprazole (PROTONIX) 40 MG tablet Take 1 tablet (40 mg total) by mouth 2 (two) times daily before a meal. 60 tablet 2   prochlorperazine (COMPAZINE) 10 MG tablet Take 0.5-1 tablets (5-10 mg total) by mouth every 8 (eight) hours as needed for nausea or vomiting. 30 tablet 1   promethazine (PHENERGAN) 12.5 MG tablet Take 1 tablet (12.5 mg total) by mouth every 6 (six) hours as needed for nausea or vomiting. 30 tablet 0   blood glucose meter kit and supplies Dispense based on patient and insurance preference. Use up to four times daily as directed. (FOR ICD-10 E10.9, E11.9). 1 each 0   blood glucose meter kit and supplies Dispense based on patient and insurance preference. Use up to four times daily as directed. (FOR ICD-10 E10.9,  E11.9). 1 each 0   Continuous Blood Gluc Sensor (DEXCOM G6 SENSOR) MISC 1 each by Does not apply route daily. 3 each 12   Continuous Blood Gluc Transmit (DEXCOM G6 TRANSMITTER) MISC 1 each by Does not apply route daily. 1 each 3   glucose blood (TRUE METRIX BLOOD GLUCOSE TEST) test strip Use as instructed (Patient taking differently: 1 each by Other route See admin instructions. Use as instructed) 100 each 12   Insulin Pen Needle (PEN NEEDLES 3/16") 31G X 5 MM MISC 1 each by Does not apply route in the morning, at noon, and at bedtime. 100 each 0   oxyCODONE (OXY IR/ROXICODONE) 5 MG immediate release tablet Take 1 tablet (5 mg total) by mouth every 8 (eight) hours as needed for breakthrough pain. (Patient not taking: Reported on 08/25/2021) 9 tablet 0   TRUEPLUS LANCETS 28G MISC assist with checking blood sugar TID and qhs (Patient taking differently: 1 each by Other route See admin instructions. assist with checking blood sugar TID and qhs) 100 each 3    Allergies as of 08/24/2021   (No Known Allergies)     Review of Systems:    As per HPI, otherwise negative    Physical Exam:  Vital signs in last 24 hours: Temp:  [98.5 F (36.9 C)] 98.5 F (36.9 C) (07/08 1653) Pulse Rate:  [103-136] 104 (07/09 0730) Resp:  [9-22] 11 (07/09 0730) BP: (84-207)/(49-137) 114/68 (07/09 0730) SpO2:  [97 %-100 %] 97 % (07/09 0730)   General:   Pleasant female lying in bed Head:  Normocephalic and atraumatic. Eyes:   No icterus.   Conjunctiva pink. Ears:  Normal auditory acuity. Neck:  Supple Lungs:  Respirations even and unlabored. Lungs clear to auscultation bilaterally.   Heart:  Regular rate and rhythm; no MRG Abdomen:  Soft, nondistended, nontender. Normal bowel sounds. No appreciable masses or hepatomegaly.  Rectal:  Not performed.  Msk:  Symmetrical without gross deformities.  Extremities:  Without edema. Neurologic:  Alert and  oriented x4;  grossly normal neurologically. Skin:  Intact without  significant lesions or rashes. Psych:  Alert and cooperative. Normal affect.  LAB RESULTS: Recent Labs    08/24/21 1858 08/24/21 1916 08/25/21 0500  WBC  --  9.9 11.7*  HGB 12.6 12.0 10.4*  HCT 37.0 36.7 32.1*  PLT  --  450* 382   BMET Recent Labs    08/24/21 1858 08/24/21 1916  NA 135 136  K 4.6 4.1  CL 99 98  CO2  --  22  GLUCOSE 399* 383*  BUN 31* 27*  CREATININE 1.20* 1.21*  CALCIUM  --  9.7   LFT Recent Labs    08/24/21 1916  PROT 8.2*  ALBUMIN 3.6  AST 20  ALT 19  ALKPHOS 93  BILITOT 1.3*   PT/INR No results for input(s): "LABPROT", "INR" in the last 72 hours.  STUDIES: CT ABDOMEN PELVIS W CONTRAST  Result Date: 08/24/2021 CLINICAL DATA:  Abdominal pain, acute, nonlocalized. History of gastroparesis. EXAM: CT ABDOMEN AND PELVIS WITH CONTRAST TECHNIQUE: Multidetector CT imaging of the abdomen and pelvis was performed using the standard protocol following bolus administration of intravenous contrast. RADIATION DOSE REDUCTION: This exam was performed according to the departmental dose-optimization program which includes automated exposure control, adjustment of the mA and/or kV according to patient size and/or use of iterative reconstruction technique. CONTRAST:  124m OMNIPAQUE IOHEXOL 300 MG/ML  SOLN COMPARISON:  08/17/2021. FINDINGS: Lower chest: No acute abnormality. Hepatobiliary: No focal liver abnormality is seen. No gallstones, gallbladder wall thickening, or biliary dilatation. Pancreas: Unremarkable. No pancreatic ductal dilatation or surrounding inflammatory changes. Spleen: Normal in size without focal abnormality. Adrenals/Urinary Tract: Adrenal glands are unremarkable. Kidneys are normal, without renal calculi, focal lesion, or hydronephrosis. Bladder is unremarkable. Stomach/Bowel: The stomach is within normal limits. No bowel obstruction, free air, or pneumatosis. A few scattered diverticula are present along the colon without evidence of diverticulitis.  The proximal appendix is prominent in size and air-filled at 1 cm, not significantly changed from the prior exam. No surrounding inflammatory changes are identified. There is mild diffuse colonic wall thickening with surrounding fat stranding. Vascular/Lymphatic: No significant vascular findings are present. No enlarged abdominal or pelvic lymph nodes. Reproductive: Uterus and bilateral adnexa are unremarkable. Other: No abdominopelvic ascites. Musculoskeletal: No acute or significant osseous findings. IMPRESSION: 1. Mild diffuse colonic wall thickening with surrounding fat stranding suggesting colitis. 2. Prominent appendix with no surrounding inflammatory changes. Findings are similar in appearance to the prior exam and likely reflect a normal variant. Correlate clinically to exclude appendicitis. Electronically Signed   By: LBrett FairyM.D.   On: 08/24/2021 22:38       Impression / Plan:   31year old female readmitted with worsening gastroparesis and some scant coffee-ground emesis with a normal hemoglobin and no significant GI bleeding.    Gastroparesis Abdominal pain Coffee ground emesis Prolonged QT interval on EKG  This is her now fourth hospitalization within the past 2 months.  Extensive evaluation as outlined above.  She has severe gastroparesis in the setting of poorly controlled diabetes which is causing her symptoms.  Multiple imaging studies with CT scans have not shown another clear source.  I do not think the colonic findings on CT scan are causing her problems, she may warrant a colonoscopy at some point in time to ensure everything is okay however certainly cannot tolerate that in her current state. She does have some gallstones but she has no baseline biliary colic symptoms, I do not think gallstones are causing this problem.  Clearly Reglan is not providing baseline benefit for her to keep her out of the hospital.  We had discussed other options.  I had recommended a trial of  domperidone as the next step in  her therapy as she has never tried this.  We had discussed this in the past, her EKG looked okay at the time, but she was not willing to pay $60 for a 1 month trial.  Her QT interval was normal on multiple EKGs however her last EKG overnight showed prolonged which is new.  This was repeated and it remains prolonged at this morning but much better than last night.  Unclear if one of the nausea medicine she got played a role in this as she received several.  This unfortunately definitely changes her inpatient plan.  With prolonged QT will need to hold Reglan, stop Zofran, stop Phenergan.  I was planning on giving her IV erythromycin but cannot give her that either right now.  We will plan on repeating an EKG later today to see if this continues to improve.  This will need to be monitored closely.  She can have Compazine as that should be safe with prolonged QT.  Alternatively I will give her some Motegrity which has been shown to improve gastric emptying and should not prolong her QTc.  Longer-term, as outpatient, if her QTc normalizes, we can consider domperidone if she is willing to pay the $60 to try it, but will need to be closely monitored.  If her QTc normalizes we will need to be on some sort of baseline antiemetic as well which she is not taking. Ultimately as an outpatient given her severe gastroparesis and multiple hospitalizations I think she would be a candidate for gastric pacemaker or pyloromyotomy.  I have referred her to [facility] [place], her case has been declined all centers.  We will need to reach back out to [facility] to see if they have discontinued their gastric pacemaker program, which we have sent patients to in the past.  We will work on this for her outpatient care. I had recommended acupuncture therapy which has been shown to help with refractory gastroparesis, she never made this appointment and recommend she do so as an outpatient.  There is no  role for Botox injection for gastroparesis at this point, it is not recommended per ACG guideline management.  Acutely while she is in the hospital, I was going to give her some IV erythromycin to help with her motility but with her prolonged QTc we cannot give her that now.  We can give her some prucalopride (motegrity) for which there is some evidence to help with gastroparesis and it should not prolong her QTc.  Ideally we could keep her on this as outpatient however I doubt it would be covered by her insurance and likely cost prohibitive. I told her otherwise I really want to minimize her narcotic use, she does not take these as outpatient but was given to her for severe pain overnight.  She is agreeable to try to come off narcotics if at all possible.  She is feeling better today, would give clear liquid diet today.    Regarding her coffee-ground emesis, I suspect this may be from esophagitis or superficial mucosal trauma in the setting of recurrent vomiting.  Her hemoglobin is normal.  She is had no significant bleeding.  We had discussed doing an EGD to further evaluate this given its been a few years since her last exam.  Unfortunately there is no room in the endoscopy suite today, we will tentatively add her to the schedule for tomorrow afternoon with Dr. Ardis Hughs. She is in agreement with the plan.  Recommend: - repeat  EKG unfortunately confirms prolonged QTc interval right now which significant influences her management, need to stop the following: Reglan, Zofran, phenergan. Cannot use erythromycin - avoid any other drugs that prolong QTc  - repeat EKG later today, will need close monitoring of this - continue compazine - continue PPI - start prucalopride 96m daily if available as inpatient (alternative regimen given prolonged QTc) - outpatient tertiary care center referral for consideration for gastric pacemaker or pyloromyotomy - outpatient referral for acupuncture - close follow up with  endocrine for her diabetes which is poorly controlled and making gastroparesis worse - possible EGD tomorrow with Dr. JArdis Hughs(no room in Endo today) - clear liquid diet today as tolerated - possible colonoscopy as outpatient to ensure no concerning pathology in regards to CT findings. She cannot tolerate a bowel prep now.  GI service to follow, call with questions. Dr. JArdis Hughsto assume her care tomorrow, I will follow up with her as outpatient following discharge  SJolly Mango MD [facility]

## 2021-08-25 NOTE — Assessment & Plan Note (Signed)
-   We will continue her antihypertensives. 

## 2021-08-25 NOTE — Assessment & Plan Note (Addendum)
-   We will place her on IV Rocephin and Flagyl. - Pain management will be provided.

## 2021-08-25 NOTE — Progress Notes (Signed)
PROGRESS NOTE  Kelli Hudson DVV:[id number] DOB: 1990/05/20 DOA: 08/24/2021 PCP: Loyola Mast, MD   LOS: 1 day   Brief Narrative / Interim history: 31 year old female with DM1, known severe gastroparesis with repeated hospitalizations, most recently was here about a week ago, comes into the hospital with recurrent symptoms with abdominal pain, intractable nausea and vomiting as well as coffee-ground emesis.  GI consulted upon her admission  Subjective / 24h Interval events: She is feeling a little bit better this morning.  Less abdominal pain, less nausea  Assesement and Plan: Principal Problem:   GI bleeding Active Problems:   Intractable nausea and vomiting   Acute colitis   GERD without esophagitis   Essential hypertension   Dyslipidemia   Type 2 diabetes mellitus without complications (HCC)  Principal problem Recurrent severe gastroparesis-patient with recurrent hospitalizations, this is her fourth in the past couple of months.  She is being followed by gastroenterology as an outpatient, and they have been consulted here, discussed her case with [name].  QT prolongation somewhat complicates treatment plan.  Currently stop Reglan, Zofran, Phenergan, cannot use erythromycin.  Continue Compazine, PPI, supportive care  Active problems Type 1 diabetes mellitus, poorly controlled, with hyperglycemia-placed on long and short acting insulin, monitor CBGs  CBG (last 3)  Recent Labs    08/24/21 1753 08/25/21 0806 08/25/21 0954  GLUCAP 347* 267* 294*   GERD-continue PPI  Hypertension-continue home medications  Prolonged QT-see discussion above, repeat EKG later today  Hyperlipidemia-continue statin  Questionable colitis-clinically without symptoms.  Colonoscopy as an outpatient per GI, she is unlikely to tolerate a bowel prep right now  Scheduled Meds:  atorvastatin  20 mg Oral Daily   carvedilol  6.25 mg Oral BID WC   insulin aspart  0-9 Units  Subcutaneous QID   insulin detemir  16 Units Subcutaneous Daily   [START ON 08/28/2021] pantoprazole  40 mg Intravenous Q12H   prochlorperazine  5-10 mg Oral Q8H   Continuous Infusions:  sodium chloride 100 mL/hr at 08/25/21 0136   pantoprazole 8 mg/hr (08/25/21 0137)   sodium chloride     PRN Meds:.acetaminophen **OR** acetaminophen, albuterol, dicyclomine, HYDROmorphone (DILAUDID) injection, hydrOXYzine, magnesium hydroxide, [DISCONTINUED] ondansetron **OR** ondansetron (ZOFRAN) IV, oxyCODONE, traZODone  Diet Orders (From admission, onward)     Start     Ordered   08/25/21 0907  Diet clear liquid Room service appropriate? Yes; Fluid consistency: Thin  Diet effective now       Question Answer Comment  Room service appropriate? Yes   Fluid consistency: Thin      08/25/21 0906            DVT prophylaxis: SCDs Start: 08/24/21 2350   Lab Results  Component Value Date   PLT 382 08/25/2021      Code Status: Full Code  Family Communication: no family at bedside  Status is: Inpatient  Remains inpatient appropriate because: Persistent symptoms  Level of care: Progressive  Consultants:  GI  Objective: Vitals:   08/25/21 0815 08/25/21 0830 08/25/21 0930 08/25/21 1000  BP: (!) 158/115 128/87 113/70 105/64  Pulse: (!) 115 (!) 107 (!) 103 (!) 101  Resp: 12 14 12 12   Temp: 98 F (36.7 C)     TempSrc: Oral     SpO2: 99% 96% 98% 97%  Weight:      Height:       No intake or output data in the 24 hours ending 08/25/21 1123 Wt Readings from Last 3 Encounters:  08/25/21 78 kg  08/17/21 78 kg  07/29/21 81.2 kg    Examination:  Constitutional: NAD Eyes: no scleral icterus ENMT: Mucous membranes are moist.  Neck: normal, supple Respiratory: clear to auscultation bilaterally, no wheezing, no crackles.  Cardiovascular: Regular rate and rhythm, no murmurs / rubs / gallops.  Abdomen: non distended, no tenderness. Bowel sounds positive.  Musculoskeletal: no clubbing /  cyanosis.  Skin: no rashes Neurologic: non focal    Data Reviewed: I have independently reviewed following labs and imaging studies   CBC Recent Labs  Lab 08/19/21 0327 08/24/21 1858 08/24/21 1916 08/25/21 0500  WBC 8.9  --  9.9 11.7*  HGB 10.6* 12.6 12.0 10.4*  HCT 32.1* 37.0 36.7 32.1*  PLT 356  --  450* 382  MCV 87.9  --  88.9 89.2  MCH 29.0  --  29.1 28.9  MCHC 33.0  --  32.7 32.4  RDW 12.8  --  13.3 13.2  LYMPHSABS  --   --  1.7  --   MONOABS  --   --  0.4  --   EOSABS  --   --  0.1  --   BASOSABS  --   --  0.0  --     Recent Labs  Lab 08/19/21 0327 08/24/21 1858 08/24/21 1916 08/25/21 0500  NA 136 135 136 137  K 4.1 4.6 4.1 4.3  CL 102 99 98 100  CO2 27  --  22 26  GLUCOSE 299* 399* 383* 318*  BUN 17 31* 27* 29*  CREATININE 0.98 1.20* 1.21* 1.26*  CALCIUM 9.2  --  9.7 8.9  AST 18  --  20  --   ALT 17  --  19  --   ALKPHOS 82  --  93  --   BILITOT 0.7  --  1.3*  --   ALBUMIN 2.8*  --  3.6  --     ------------------------------------------------------------------------------------------------------------------ No results for input(s): "CHOL", "HDL", "LDLCALC", "TRIG", "CHOLHDL", "LDLDIRECT" in the last 72 hours.  Lab Results  Component Value Date   HGBA1C 9.7 (H) 05/28/2021   ------------------------------------------------------------------------------------------------------------------ No results for input(s): "TSH", "T4TOTAL", "T3FREE", "THYROIDAB" in the last 72 hours.  Invalid input(s): "FREET3"  Cardiac Enzymes No results for input(s): "CKMB", "TROPONINI", "MYOGLOBIN" in the last 168 hours.  Invalid input(s): "CK" ------------------------------------------------------------------------------------------------------------------ No results found for: "BNP"  CBG: Recent Labs  Lab 08/19/21 0734 08/19/21 1141 08/24/21 1753 08/25/21 0806 08/25/21 0954  GLUCAP 295* 273* 347* 267* 294*    No results found for this or any previous visit  (from the past 240 hour(s)).   Radiology Studies: CT ABDOMEN PELVIS W CONTRAST  Result Date: 08/24/2021 CLINICAL DATA:  Abdominal pain, acute, nonlocalized. History of gastroparesis. EXAM: CT ABDOMEN AND PELVIS WITH CONTRAST TECHNIQUE: Multidetector CT imaging of the abdomen and pelvis was performed using the standard protocol following bolus administration of intravenous contrast. RADIATION DOSE REDUCTION: This exam was performed according to the departmental dose-optimization program which includes automated exposure control, adjustment of the mA and/or kV according to patient size and/or use of iterative reconstruction technique. CONTRAST:  OMNIPAQUE IOHEXOL 300 MG/ML  SOLN COMPARISON:  08/17/2021. FINDINGS: Lower chest: No acute abnormality. Hepatobiliary: No focal liver abnormality is seen. No gallstones, gallbladder wall thickening, or biliary dilatation. Pancreas: Unremarkable. No pancreatic ductal dilatation or surrounding inflammatory changes. Spleen: Normal in size without focal abnormality. Adrenals/Urinary Tract: Adrenal glands are unremarkable. Kidneys are normal, without renal calculi, focal lesion, or hydronephrosis. Bladder is  unremarkable. Stomach/Bowel: The stomach is within normal limits. No bowel obstruction, free air, or pneumatosis. A few scattered diverticula are present along the colon without evidence of diverticulitis. The proximal appendix is prominent in size and air-filled at 1 cm, not significantly changed from the prior exam. No surrounding inflammatory changes are identified. There is mild diffuse colonic wall thickening with surrounding fat stranding. Vascular/Lymphatic: No significant vascular findings are present. No enlarged abdominal or pelvic lymph nodes. Reproductive: Uterus and bilateral adnexa are unremarkable. Other: No abdominopelvic ascites. Musculoskeletal: No acute or significant osseous findings. IMPRESSION: 1. Mild diffuse colonic wall thickening with  surrounding fat stranding suggesting colitis. 2. Prominent appendix with no surrounding inflammatory changes. Findings are similar in appearance to the prior exam and likely reflect a normal variant. Correlate clinically to exclude appendicitis. Electronically Signed   By: Thornell Sartorius M.D.   On: 08/24/2021 22:38     Pamella Pert, MD, PhD [facility]  Between 7 am - 7 pm I am available, please contact me via Amion (for emergencies) or Securechat (non urgent messages)  Between 7 pm - 7 am I am not available, please contact night coverage MD/APP via Amion

## 2021-08-25 NOTE — Assessment & Plan Note (Signed)
-   The patient will be admitted to a progressive unit bed. - She will be kept n.p.o. after midnight. -We will place on IV Protonix drip. - We will follow serial hemoglobins and hematocrits. - She will be typed and crossmatched.  At this time H&H are within normal. - GI consultation will be obtained. - Dr. Leone Payor with [facility] was notified about the patient.

## 2021-08-26 ENCOUNTER — Encounter (HOSPITAL_COMMUNITY)
Admission: EM | Disposition: A | Discharge status: Home / Self Care | Payer: Self-pay | Source: Home / Self Care | Attending: Internal Medicine

## 2021-08-26 ENCOUNTER — Inpatient Hospital Stay (HOSPITAL_COMMUNITY): Payer: 59 | Admitting: Anesthesiology

## 2021-08-26 ENCOUNTER — Encounter (HOSPITAL_COMMUNITY): Payer: Self-pay | Admitting: Family Medicine

## 2021-08-26 DIAGNOSIS — R1010 Upper abdominal pain, unspecified: Secondary | ICD-10-CM

## 2021-08-26 DIAGNOSIS — R112 Nausea with vomiting, unspecified: Secondary | ICD-10-CM | POA: Diagnosis not present

## 2021-08-26 DIAGNOSIS — K920 Hematemesis: Secondary | ICD-10-CM

## 2021-08-26 DIAGNOSIS — I10 Essential (primary) hypertension: Secondary | ICD-10-CM | POA: Diagnosis not present

## 2021-08-26 DIAGNOSIS — K3184 Gastroparesis: Secondary | ICD-10-CM | POA: Diagnosis not present

## 2021-08-26 HISTORY — PX: BOTOX INJECTION: SHX5754

## 2021-08-26 HISTORY — PX: ESOPHAGOGASTRODUODENOSCOPY (EGD) WITH PROPOFOL: SHX5813

## 2021-08-26 LAB — COMPREHENSIVE METABOLIC PANEL
ALT: 14 U/L (ref 0–44)
AST: 16 U/L (ref 15–41)
Albumin: 2.8 g/dL — ABNORMAL LOW (ref 3.5–5.0)
Alkaline Phosphatase: 65 U/L (ref 38–126)
Anion gap: 8 (ref 5–15)
BUN: 16 mg/dL (ref 6–20)
CO2: 26 mmol/L (ref 22–32)
Calcium: 9.2 mg/dL (ref 8.9–10.3)
Chloride: 104 mmol/L (ref 98–111)
Creatinine, Ser: 0.93 mg/dL (ref 0.44–1.00)
GFR, Estimated: >60 mL/min (ref >60)
Glucose, Bld: 145 mg/dL — ABNORMAL HIGH (ref 70–99)
Potassium: 4.2 mmol/L (ref 3.5–5.1)
Sodium: 138 mmol/L (ref 135–145)
Total Bilirubin: 1 mg/dL (ref 0.3–1.2)
Total Protein: 6.2 g/dL — ABNORMAL LOW (ref 6.5–8.1)

## 2021-08-26 LAB — CBC
HCT: 32.3 % — ABNORMAL LOW (ref 36.0–46.0)
Hemoglobin: 10.5 g/dL — ABNORMAL LOW (ref 12.0–15.0)
MCH: 29.2 pg (ref 26.0–34.0)
MCHC: 32.5 g/dL (ref 30.0–36.0)
MCV: 89.7 fL (ref 80.0–100.0)
Platelets: 370 10*3/uL (ref 150–400)
RBC: 3.6 MIL/uL — ABNORMAL LOW (ref 3.87–5.11)
RDW: 13.2 % (ref 11.5–15.5)
WBC: 9 10*3/uL (ref 4.0–10.5)
nRBC: 0 % (ref 0.0–0.2)

## 2021-08-26 LAB — GLUCOSE, CAPILLARY
Glucose-Capillary: 191 mg/dL — ABNORMAL HIGH (ref 70–99)
Glucose-Capillary: 211 mg/dL — ABNORMAL HIGH (ref 70–99)
Glucose-Capillary: 165 mg/dL — ABNORMAL HIGH (ref 70–99)
Glucose-Capillary: 209 mg/dL — ABNORMAL HIGH (ref 70–99)
Glucose-Capillary: 289 mg/dL — ABNORMAL HIGH (ref 70–99)

## 2021-08-26 LAB — TSH: TSH: 1.683 u[IU]/mL (ref 0.350–4.500)

## 2021-08-26 LAB — HEMOGLOBIN A1C
Hgb A1c MFr Bld: 12.2 % — ABNORMAL HIGH (ref 4.8–5.6)
Mean Plasma Glucose: 303.44 mg/dL

## 2021-08-26 SURGERY — ESOPHAGOGASTRODUODENOSCOPY (EGD) WITH PROPOFOL
Anesthesia: Monitor Anesthesia Care

## 2021-08-26 MED ORDER — LACTATED RINGERS IV SOLN: INTRAVENOUS | Status: DC

## 2021-08-26 MED ORDER — PROPOFOL 10 MG/ML IV BOLUS
INTRAVENOUS | Status: DC | PRN
  Administered 2021-08-26: 70 mg via INTRAVENOUS

## 2021-08-26 MED ORDER — LIDOCAINE HCL (CARDIAC) PF 100 MG/5ML IV SOSY
PREFILLED_SYRINGE | INTRAVENOUS | Status: DC | PRN
  Administered 2021-08-26: 100 mg via INTRAVENOUS

## 2021-08-26 MED ORDER — PROPOFOL 500 MG/50ML IV EMUL
INTRAVENOUS | Status: DC | PRN
  Administered 2021-08-26: 140 ug/kg/min via INTRAVENOUS

## 2021-08-26 MED ORDER — ONABOTULINUMTOXINA 100 UNITS IJ SOLR
INTRAMUSCULAR | Status: AC
  Filled 2021-08-26: qty 100

## 2021-08-26 MED ORDER — SODIUM CHLORIDE (PF) 0.9 % IJ SOLN
INTRAMUSCULAR | Status: DC | PRN
  Administered 2021-08-26: 4 mL via SUBMUCOSAL

## 2021-08-26 MED ORDER — PHENYLEPHRINE 80 MCG/ML (10ML) SYRINGE FOR IV PUSH (FOR BLOOD PRESSURE SUPPORT)
PREFILLED_SYRINGE | INTRAVENOUS | Status: DC | PRN
  Administered 2021-08-26: 80 ug via INTRAVENOUS

## 2021-08-26 MED ORDER — INSULIN DETEMIR 100 UNIT/ML ~~LOC~~ SOLN
10.0000 [IU] | Freq: Every day | SUBCUTANEOUS | Status: DC
  Administered 2021-08-26 – 2021-08-27 (×2): 10 [IU] via SUBCUTANEOUS
  Filled 2021-08-26 (×2): qty 0.1

## 2021-08-26 SURGICAL SUPPLY — 15 items

## 2021-08-26 NOTE — Inpatient Diabetes Management (Addendum)
Inpatient Diabetes Program Recommendations  AACE/ADA: New Consensus Statement on Inpatient Glycemic Control (2015)  Target Ranges:  Prepandial:   less than 140 mg/dL      Peak postprandial:   less than 180 mg/dL (1-2 hours)      Critically ill patients:  140 - 180 mg/dL   Lab Results  Component Value Date   GLUCAP 191 (H) 08/26/2021   HGBA1C 12.2 (H) 08/26/2021    Review of Glycemic Control  Latest Reference Range & Units 08/25/21 13:20 08/25/21 16:53 08/25/21 21:25 08/26/21 07:44  Glucose-Capillary 70 - 99 mg/dL 160 (H) 737 (H) 106 (H) 191 (H)  (H): Data is abnormally high Diabetes history: Type 1 DM Outpatient Diabetes medications: Novolin R-via Tandem- 0.5 units/hr, ICR- 1:12, SF- 1:30, Goal 70-110 Current orders for Inpatient glycemic control: Levemir 10 units QD, Novolog 0-9 units QID  Inpatient Diabetes Program Recommendations:    Noted decrease to Levemir. Consider increasing Levemir back to 16 units QD. Will plan to see.   Addendum: Spoke with patient regarding outpatient diabetes management. Patient is followed by Dr Lonzo Cloud, outpatient endocrinology with last appointment 04/18/21 and has previously been seen by Dm coordinators on 07/22/21 & 07/29/21. Has currently been using injections because she ran over her insulin pump. Denies correcting blood sugars when unable to keep food down.  Reviewed patient's current A1c of 12.2% which is up from previous A1C of 9.7%. Explained what a A1c is and what it measures. Also reviewed goal A1c with patient, importance of good glucose control @ home, and blood sugar goals. Reviewed patho of DM, need for insulin, impact of gastroparesis, timing of insulin vs meal consumption, vascular chang es and commorbidities.  Has supplies and plans to visit Dr Lonzo Cloud next week. Reviewed questions to ask at the next visit to include sick day plan with Q4H correction vs getting new insulin pump.    Thanks, Lujean Rave, MSN, RNC-OB Diabetes  Coordinator [phone] (8a-5p)

## 2021-08-26 NOTE — Op Note (Signed)
[facility] Patient Name: Kelli Hudson Procedure Date: 08/26/2021 MRN: [id number] Attending MD: Estill Cotta. Loletha Carrow , MD Date of Birth: 1991-02-09 CSN: [id number] Age: 31 Admit Type: Inpatient Procedure:                Upper GI endoscopy Indications:              Upper abdominal pain, Hematemesis (coffee grounds                            emesis), Nausea with vomiting                           Diabetic gastropahty refractory to multiple                            treatments. Hgb A1C = 12.2 Providers:                Mallie Mussel L. Loletha Carrow, MD, Truddie Coco, RN, Darliss Cheney,                            Technician Referring MD:              Medicines:                Monitored Anesthesia Care Complications:            No immediate complications. Estimated Blood Loss:     Estimated blood loss: none. Procedure:                Pre-Anesthesia Assessment:                           - Prior to the procedure, a History and Physical                            was performed, and patient medications and                            allergies were reviewed. The patient's tolerance of                            previous anesthesia was also reviewed. The risks                            and benefits of the procedure and the sedation                            options and risks were discussed with the patient.                            All questions were answered, and informed consent                            was obtained. Prior Anticoagulants: The patient has                            taken no previous anticoagulant  or antiplatelet                            agents. ASA Grade Assessment: IV - A patient with                            severe systemic disease that is a constant threat                            to life. After reviewing the risks and benefits,                            the patient was deemed in satisfactory condition to                            undergo the procedure.                            After obtaining informed consent, the endoscope was                            passed under direct vision. Throughout the                            procedure, the patient's blood pressure, pulse, and                            oxygen saturations were monitored continuously. The                            [id number]-[id number] ([id number] Olympus endoscope was introduced                            through the mouth, and advanced to the second part                            of duodenum. The upper GI endoscopy was                            accomplished without difficulty. The patient                            tolerated the procedure well. Scope In: Scope Out: Findings:      The larynx was normal.      The esophagus was normal.      The entire examined stomach was visibly normal. Specifically, no ulcer,       mass, inflammation or retained contents other than a small amount of       bilious liquid that was easily cleared with suction. Diminished motility       seen. The pylorus was patent and without ulceration. Several minutes of       observation revealed intermittent pyloric closure causing resistance to       scope passage. The pylorus was successfully injected with 100 units       botulinum toxin in  a 4-quadrant fashion.      The cardia and gastric fundus were normal on retroflexion.      The examined duodenum was normal. Impression:               - Normal larynx.                           - Normal esophagus.                           - Normal stomach. Clinical question of pyloric                            spasm - pylorus injected with botulinum toxin.                           - Normal examined duodenum.                           - No specimens collected. Moderate Sedation:      MAC sedation used Recommendation:           - Return patient to hospital ward for ongoing care.                           - Full liquid diet - advance as tolerated to                             diabetic (ADA) diet.                           - Continue present medications. Procedure Code(s):        --- Professional ---                           [phone], Esophagogastroduodenoscopy, flexible,                            transoral; with directed submucosal injection(s),                            any substance Diagnosis Code(s):        --- Professional ---                           R10.10, Upper abdominal pain, unspecified                           K92.0, Hematemesis                           R11.2, Nausea with vomiting, unspecified CPT copyright 2019 American Medical Association. All rights reserved. The codes documented in this report are preliminary and upon coder review may  be revised to meet current compliance requirements. Annelies Coyt L. Loletha Carrow, MD 08/26/2021 12:13:56 PM This report has been signed electronically. Number of Addenda: 0

## 2021-08-26 NOTE — Plan of Care (Signed)
  Problem: Education: Goal: Ability to describe self-care measures that may prevent or decrease complications (Diabetes Survival Skills Education) will improve Outcome: Progressing   Problem: Activity: Goal: Risk for activity intolerance will decrease Outcome: Progressing   Problem: Education: Goal: Knowledge of General Education information will improve Description: Including pain rating scale, medication(s)/side effects and non-pharmacologic comfort measures Outcome: Completed/Met

## 2021-08-26 NOTE — Transfer of Care (Signed)
Immediate Anesthesia Transfer of Care Note  Patient: Kelli Hudson  Procedure(s) Performed: ESOPHAGOGASTRODUODENOSCOPY (EGD) WITH PROPOFOL BOTOX INJECTION  Patient Location: PACU and Endoscopy Unit  Anesthesia Type:MAC  Level of Consciousness: drowsy  Airway & Oxygen Therapy: Patient Spontanous Breathing  Post-op Assessment: Report given to RN and Post -op Vital signs reviewed and stable  Post vital signs: Reviewed and stable  Last Vitals:  Vitals Value Taken Time  BP 111/68 08/26/21 1210  Temp    Pulse 92 08/26/21 1210  Resp 14 08/26/21 1210  SpO2 100 % 08/26/21 1210  Vitals shown include unvalidated device data.  Last Pain:  Vitals:   08/26/21 1059  TempSrc: Oral  PainSc: 9       Patients Stated Pain Goal: 5 (69/62/95 2841)  Complications: No notable events documented.

## 2021-08-26 NOTE — Anesthesia Postprocedure Evaluation (Signed)
Anesthesia Post Note  Patient: Engineer, mining  Procedure(s) Performed: ESOPHAGOGASTRODUODENOSCOPY (EGD) WITH PROPOFOL BOTOX INJECTION     Patient location during evaluation: Endoscopy Anesthesia Type: MAC Level of consciousness: awake and alert Pain management: pain level controlled Vital Signs Assessment: post-procedure vital signs reviewed and stable Respiratory status: spontaneous breathing, nonlabored ventilation, respiratory function stable and patient connected to nasal cannula oxygen Cardiovascular status: blood pressure returned to baseline and stable Postop Assessment: no apparent nausea or vomiting Anesthetic complications: no   No notable events documented.  Last Vitals:  Vitals:   08/26/21 1246 08/26/21 1321  BP: (!) 121/98 (!) 144/103  Pulse: (!) 108 (!) 103  Resp: 16 14  Temp: 36.8 C 37.1 C  SpO2: 100% 99%    Last Pain:  Vitals:   08/26/21 1246  TempSrc: Oral  PainSc: 9                  Francella Barnett L Kendalyn Cranfield

## 2021-08-26 NOTE — Anesthesia Preprocedure Evaluation (Addendum)
Anesthesia Evaluation  Patient identified by MRN, date of birth, ID band Patient awake    Reviewed: Allergy & Precautions, NPO status , Patient's Chart, lab work & pertinent test results, reviewed documented beta blocker date and time   Airway Mallampati: III  TM Distance: >3 FB Neck ROM: Full    Dental no notable dental hx. (+) Teeth Intact, Dental Advisory Given   Pulmonary neg pulmonary ROS,    Pulmonary exam normal breath sounds clear to auscultation       Cardiovascular hypertension, Pt. on home beta blockers and Pt. on medications Normal cardiovascular exam+ dysrhythmias (prolonged QT)  Rhythm:Regular Rate:Normal     Neuro/Psych PSYCHIATRIC DISORDERS Anxiety Depression negative neurological ROS     GI/Hepatic Neg liver ROS, PUD, GERD  Medicated,  Endo/Other  diabetes, Type 1, Insulin DependentHyperthyroidism   Renal/GU negative Renal ROS  negative genitourinary   Musculoskeletal negative musculoskeletal ROS (+)   Abdominal   Peds  Hematology  (+) Blood dyscrasia, anemia , Lab Results      Component                Value               Date                      WBC                      9.0                 08/26/2021                HGB                      10.5 (L)            08/26/2021                HCT                      32.3 (L)            08/26/2021                MCV                      89.7                08/26/2021                PLT                      370                 08/26/2021              Anesthesia Other Findings history of type 1 diabetes, hemoglobin A1c between 9's and 14s, with severe gastroparesis, admitted with worsening gastroparesis and coffee-ground emesis  Reproductive/Obstetrics                            Anesthesia Physical Anesthesia Plan  ASA: 3  Anesthesia Plan: MAC   Post-op Pain Management:    Induction: Intravenous  PONV Risk Score and Plan:  Propofol infusion and Treatment may vary due to age or medical condition  Airway Management Planned: Natural Airway  Additional Equipment:   Intra-op  Plan:   Post-operative Plan:   Informed Consent: I have reviewed the patients History and Physical, chart, labs and discussed the procedure including the risks, benefits and alternatives for the proposed anesthesia with the patient or authorized representative who has indicated his/her understanding and acceptance.     Dental advisory given  Plan Discussed with: CRNA  Anesthesia Plan Comments:         Anesthesia Quick Evaluation

## 2021-08-26 NOTE — Interval H&P Note (Signed)
History and Physical Interval Note:  08/26/2021 11:25 AM  Kelli Hudson  has presented today for surgery, with the diagnosis of coffee ground emesis.  The various methods of treatment have been discussed with the patient and family. After consideration of risks, benefits and other options for treatment, the patient has consented to  Procedure(s): ESOPHAGOGASTRODUODENOSCOPY (EGD) WITH PROPOFOL (N/A) as a surgical intervention.  The patient's history has been reviewed, patient examined, no change in status, stable for surgery.  I have reviewed the patient's chart and labs.  Questions were answered to the patient's satisfaction.    Dr. Lanetta Inch consult note reviewed and patient seen and examined in the endoscopy preprocedure area. Severe diabetic gastropathy refractory to multiple treatments. Coffee-ground emesis with this episode. Upper endoscopy planned to discover source of hematemesis, and I offered her the possibility of a pylorus Botox injection depending on endoscopic findings and technical feasibility.  She was agreeable.  Charlie Pitter III

## 2021-08-26 NOTE — Progress Notes (Signed)
PROGRESS NOTE  Kelli Hudson LEX:[id number] DOB: [birth year] DOA: 08/24/2021 PCP: Loyola Mast, MD   LOS: 2 days   Brief Narrative / Interim history: 31 year old female with DM1, known severe gastroparesis with repeated hospitalizations, most recently was here about a week ago, comes into the hospital with recurrent symptoms with abdominal pain, intractable nausea and vomiting as well as coffee-ground emesis.  GI consulted upon her admission  Subjective / 24h Interval events: Still has some pain and mild nausea but overall feels improved.  Awaiting EGD.  Assesement and Plan: Principal Problem:   GI bleeding Active Problems:   Intractable nausea and vomiting   Acute colitis   GERD without esophagitis   Essential hypertension   Dyslipidemia   Type 2 diabetes mellitus without complications (HCC)  Principal problem Recurrent severe gastroparesis-patient with recurrent hospitalizations, this is her fourth in the past couple of months.  She is being followed by gastroenterology as an outpatient, and they have been consulted here, discussed her case with [name].  On 7/9, QT was found to be prolonged and her QT prolongation medications were discontinued.  Fortunately this morning her QT is recovered, defer to GI further treatment plan.  Now n.p.o. for upcoming EGD.  Will need to tolerate diet before home discharge  Active problems Type 1 diabetes mellitus, poorly controlled, with hyperglycemia-placed on long and short acting insulin, monitor CBGs  CBG (last 3)  Recent Labs    08/25/21 2125 08/26/21 0744 08/26/21 1035  GLUCAP 179* 191* 211*    GERD-continue PPI  Hypertension-continue home medications  Prolonged QT-QT this morning for 07  Hyperlipidemia-continue statin  Questionable colitis-clinically without symptoms.  Colonoscopy as an outpatient per GI, she is unlikely to tolerate a bowel prep right now  Scheduled Meds:  [MAR Hold] atorvastatin  20 mg Oral  Daily   [MAR Hold] carvedilol  6.25 mg Oral BID WC   [MAR Hold] insulin aspart  0-9 Units Subcutaneous QID   [MAR Hold] insulin detemir  10 Units Subcutaneous Daily   [MAR Hold] pantoprazole  40 mg Intravenous Q12H   [MAR Hold] prochlorperazine  5-10 mg Oral Q8H   Continuous Infusions:  sodium chloride 100 mL/hr at 08/26/21 0741   pantoprazole 8 mg/hr (08/26/21 0836)   [MAR Hold] sodium chloride     PRN Meds:.[MAR Hold] acetaminophen **OR** [MAR Hold] acetaminophen, [MAR Hold] albuterol, [MAR Hold] dicyclomine, [MAR Hold]  HYDROmorphone (DILAUDID) injection, [MAR Hold] hydrOXYzine, [MAR Hold] magnesium hydroxide, [DISCONTINUED] ondansetron **OR** [MAR Hold] ondansetron (ZOFRAN) IV, [MAR Hold] oxyCODONE, [MAR Hold] traZODone  Diet Orders (From admission, onward)     Start     Ordered   08/26/21 0802  Diet NPO time specified  Diet effective now        08/26/21 0801            DVT prophylaxis: SCDs Start: 08/24/21 2350   Lab Results  Component Value Date   PLT 370 08/26/2021      Code Status: Full Code  Family Communication: no family at bedside  Status is: Inpatient  Remains inpatient appropriate because: Persistent symptoms  Level of care: Progressive  Consultants:  GI  Objective: Vitals:   08/25/21 1643 08/25/21 2128 08/26/21 0141 08/26/21 0509  BP: (!) 147/97 129/88 127/88 (!) 156/114  Pulse: (!) 107 96 97 (!) 109  Resp: 16 18 20 20   Temp: 98.6 F (37 C) 98.7 F (37.1 C) 98.4 F (36.9 C) 97.8 F (36.6 C)  TempSrc: Oral Oral Oral Oral  SpO2: 100% 100% 99% 100%  Weight:      Height:        Intake/Output Summary (Last 24 hours) at 08/26/2021 1100 Last data filed at 08/25/2021 1759 Gross per 24 hour  Intake 255.48 ml  Output --  Net 255.48 ml   Wt Readings from Last 3 Encounters:  08/25/21 78 kg  08/17/21 78 kg  07/29/21 81.2 kg    Examination:  Constitutional: NAD Eyes: lids and conjunctivae normal, no scleral icterus ENMT: mmm Neck:  normal, supple Respiratory: clear to auscultation bilaterally, no wheezing, no crackles.  Cardiovascular: Regular rate and rhythm, no murmurs / rubs / gallops.  Skin: no rashes Neurologic: no focal deficits, equal strength   Data Reviewed: I have independently reviewed following labs and imaging studies   CBC Recent Labs  Lab 08/24/21 1858 08/24/21 1916 08/25/21 0500 08/26/21 0452  WBC  --  9.9 11.7* 9.0  HGB 12.6 12.0 10.4* 10.5*  HCT 37.0 36.7 32.1* 32.3*  PLT  --  450* 382 370  MCV  --  88.9 89.2 89.7  MCH  --  29.1 28.9 29.2  MCHC  --  32.7 32.4 32.5  RDW  --  13.3 13.2 13.2  LYMPHSABS  --  1.7  --   --   MONOABS  --  0.4  --   --   EOSABS  --  0.1  --   --   BASOSABS  --  0.0  --   --      Recent Labs  Lab 08/24/21 1858 08/24/21 1916 08/25/21 0500 08/26/21 0452  NA 135 136 137 138  K 4.6 4.1 4.3 4.2  CL 99 98 100 104  CO2  --  22 26 26   GLUCOSE 399* 383* 318* 145*  BUN 31* 27* 29* 16  CREATININE 1.20* 1.21* 1.26* 0.93  CALCIUM  --  9.7 8.9 9.2  AST  --  20  --  16  ALT  --  19  --  14  ALKPHOS  --  93  --  65  BILITOT  --  1.3*  --  1.0  ALBUMIN  --  3.6  --  2.8*  TSH  --   --   --  1.683  HGBA1C  --   --   --  12.2*     ------------------------------------------------------------------------------------------------------------------ No results for input(s): "CHOL", "HDL", "LDLCALC", "TRIG", "CHOLHDL", "LDLDIRECT" in the last 72 hours.  Lab Results  Component Value Date   HGBA1C 12.2 (H) 08/26/2021   ------------------------------------------------------------------------------------------------------------------ Recent Labs    08/26/21 0452  TSH 1.683    Cardiac Enzymes No results for input(s): "CKMB", "TROPONINI", "MYOGLOBIN" in the last 168 hours.  Invalid input(s): "CK" ------------------------------------------------------------------------------------------------------------------ No results found for: "BNP"  CBG: Recent Labs   Lab 08/25/21 1320 08/25/21 1653 08/25/21 2125 08/26/21 0744 08/26/21 1035  GLUCAP 212* 226* 179* 191* 211*     No results found for this or any previous visit (from the past 240 hour(s)).   Radiology Studies: No results found.   10/27/21, MD, PhD [facility]  Between 7 am - 7 pm I am available, please contact me via Amion (for emergencies) or Securechat (non urgent messages)  Between 7 pm - 7 am I am not available, please contact night coverage MD/APP via Amion

## 2021-08-27 ENCOUNTER — Encounter (HOSPITAL_COMMUNITY): Payer: Self-pay | Admitting: Gastroenterology

## 2021-08-27 ENCOUNTER — Other Ambulatory Visit (HOSPITAL_COMMUNITY): Payer: Self-pay

## 2021-08-27 DIAGNOSIS — K920 Hematemesis: Secondary | ICD-10-CM | POA: Diagnosis not present

## 2021-08-27 LAB — CBC
HCT: 31.4 % — ABNORMAL LOW (ref 36.0–46.0)
Hemoglobin: 10.5 g/dL — ABNORMAL LOW (ref 12.0–15.0)
MCH: 29.8 pg (ref 26.0–34.0)
MCHC: 33.4 g/dL (ref 30.0–36.0)
MCV: 89.2 fL (ref 80.0–100.0)
Platelets: 325 10*3/uL (ref 150–400)
RBC: 3.52 MIL/uL — ABNORMAL LOW (ref 3.87–5.11)
RDW: 13.1 % (ref 11.5–15.5)
WBC: 6.9 10*3/uL (ref 4.0–10.5)
nRBC: 0 % (ref 0.0–0.2)

## 2021-08-27 LAB — BASIC METABOLIC PANEL
Anion gap: 7 (ref 5–15)
BUN: 10 mg/dL (ref 6–20)
CO2: 25 mmol/L (ref 22–32)
Calcium: 8.9 mg/dL (ref 8.9–10.3)
Chloride: 105 mmol/L (ref 98–111)
Creatinine, Ser: 0.89 mg/dL (ref 0.44–1.00)
GFR, Estimated: >60 mL/min (ref >60)
Glucose, Bld: 230 mg/dL — ABNORMAL HIGH (ref 70–99)
Potassium: 4.3 mmol/L (ref 3.5–5.1)
Sodium: 137 mmol/L (ref 135–145)

## 2021-08-27 LAB — GLUCOSE, CAPILLARY
Glucose-Capillary: 238 mg/dL — ABNORMAL HIGH (ref 70–99)
Glucose-Capillary: 242 mg/dL — ABNORMAL HIGH (ref 70–99)

## 2021-08-27 MED ORDER — PROCHLORPERAZINE MALEATE 10 MG PO TABS
5.0000 mg | ORAL_TABLET | Freq: Three times a day (TID) | ORAL | 0 refills | Status: DC | PRN
  Filled 2021-08-27: qty 30, 10d supply, fill #0

## 2021-08-27 MED ORDER — ONDANSETRON HCL 4 MG PO TABS
4.0000 mg | ORAL_TABLET | Freq: Three times a day (TID) | ORAL | 0 refills | Status: DC | PRN
  Filled 2021-08-27: qty 21, 30d supply, fill #0

## 2021-08-27 NOTE — Progress Notes (Addendum)
[facility] Progress Note  CC: Gastroparesis, coffee-ground emesis  Subjective: She is following less nauseous today.  No vomiting for the past 4 to 5 days.  She ate a small amount of crackers and chicken broth earlier today.  She complains of generalized abdominal soreness, no severe abdominal pain.  She passed a normal formed brown bowel movement today.  No rectal bleeding or black stool.  Objective:   EGD 08/26/2021: - Normal larynx. - Normal esophagus. - Normal stomach. Clinical question of pyloric spasm - pylorus injected with botulinum toxin. - Normal examined duodenum. - No specimens collected.  Vital signs in last 24 hours: Temp:  [98.2 F (36.8 C)-98.9 F (37.2 C)] 98.2 F (36.8 C) (07/11 0514) Pulse Rate:  [92-108] 101 (07/11 0514) Resp:  [13-18] 18 (07/11 0514) BP: (111-144)/(68-103) 140/97 (07/11 0514) SpO2:  [98 %-100 %] 98 % (07/11 0514) Last BM Date : 08/25/21  General: 31 year old female in no acute distress. Heart: Regular rate rhythm, no murmurs. Pulm: Sounds clear throughout. Abdomen: Mild generalized tenderness without rebound or guarding.  Positive bowel sounds to all 4 quadrants. Extremities:  Without edema. Neurologic:  Alert and  oriented x 4. Grossly normal neurologically. Psych:  Alert and cooperative. Normal mood and affect.  Lab Results: Recent Labs    08/25/21 0500 08/26/21 0452 08/27/21 0729  WBC 11.7* 9.0 6.9  HGB 10.4* 10.5* 10.5*  HCT 32.1* 32.3* 31.4*  PLT 382 370 325   BMET Recent Labs    08/25/21 0500 08/26/21 0452 08/27/21 0729  NA 137 138 137  K 4.3 4.2 4.3  CL 100 104 105  CO2 26 26 25   GLUCOSE 318* 145* 230*  BUN 29* 16 10  CREATININE 1.26* 0.93 0.89  CALCIUM 8.9 9.2 8.9   LFT Recent Labs    08/26/21 0452  PROT 6.2*  ALBUMIN 2.8*  AST 16  ALT 14  ALKPHOS 65  BILITOT 1.0   Assessment / Plan:  22) 31 year old female with diabetes mellitus type 1 poorly controlled admitted to the hospital  with worsening gastroparesis with hematemesis.  CTAP 7/8 findings did not explain symptoms. Reglan, Zofran and Phenergan discontinued due to prolonged QTc.  On Compazine. EGD 08/26/2021 showed a normal stomach, clinical question of pyloric spasm, pylorus was injected with botulinum toxin.  On PPI infusion.  Hemoglobin A1c 12.2 %.  On NovoLog insulin 4 times daily, Levemir daily.  QTc 407 per EKG on 710. -Stop PPI infusion.  Pantoprazole 40 mg IV twice daily. -Compazine 5 to 10 mg po every 8 hours -May continue Zofran 4 mg IV every 6 hours as long as her QTc interval remains within normal limits - ?  Motegrity -Advance to a soft diabetic diet as tolerated -Eventual outpatient tertiary care center/gastroparesis specialist referral -Patient requires follow-up with endocrinologist soon as possible post hospital discharge, hopefully reinitiate insulin pump for improved glycemic control which is imperative for gastroparesis management    LOS: 3 days   10/27/2021  08/27/2021, 11:38 AM  _______________________________________________________________________________________________________________________________________  10/28/2021 GI MD note:  I personally examined the patient, reviewed the data and agree with the assessment and plan described above.  I provided a substantive portion of the care of this patient (personally provided more than half of the total time dedicated to the treatment of this patient). Her diabetes is very poorly controlled, Hb A1C was 12.2 yesterday. I don't think her gastroparesis is going to significantly improve until that number is much lower.  Her nausea is gone this afternoon and she is interested in going home. That is certainly OK from my perspective. She should be on PPI BID, compazine 5-10mg  PO TID PRN, zofran 4mg  BID PRN.  Most important is blood sugar control.  Looks like she has an appt with her endocrinologist tomorrow and she should definitely go to that  appt.  She should call [facility] for a follow up appt in the next few weeks.  Please call or page with any further questions or concerns.   , MD [facility] Pager [phone]

## 2021-08-27 NOTE — Progress Notes (Signed)
  Transition of Care [facility]) Screening Note   Patient Details  Name: Kelli Hudson Date of Birth: Aug 26, 1990   Transition of Care [facility]) CM/SW Contact:    Lanier Clam, RN Phone Number: 08/27/2021, 12:13 PM    Transition of Care Department [facility]) has reviewed patient and no TOC needs have been identified at this time. We will continue to monitor patient advancement through interdisciplinary progression rounds. If new patient transition needs arise, please place a TOC consult.

## 2021-08-27 NOTE — Discharge Summary (Signed)
Physician Discharge Summary  [name] VQQ:[id number] DOB: 1990-06-06 DOA: 08/24/2021  PCP: [name], MD  Admit date: 08/24/2021 Discharge date: 08/27/2021  Admitted From: home Disposition:  home  Recommendations for Outpatient Follow-up:  Follow up with Dr. Kelton Pillar as scheduled in 1 day  Home Health: none Equipment/Devices: none  Discharge Condition: stable CODE STATUS: Full code Diet Orders (From admission, onward)     Start     Ordered   08/26/21 1752  DIET SOFT Room service appropriate? Yes; Fluid consistency: Thin  Diet effective now       Question Answer Comment  Room service appropriate? Yes   Fluid consistency: Thin      08/26/21 1751            HPI: Per admitting MD, Kelli Hudson is a 31 y.o. female with medical history significant for type 1 diabetes mellitus, diabetic gastroparesis, GERD, hypertension and hypothyroidism with history of H. pylori, who presented to the ER with a [place] of epigastric abdominal pain with associated chronic nausea and vomiting since Friday.  She admitted to vomiting of coffee-ground emesis.  She has been having chest pain that she describes as pressure.  She denies any melena or bright red bleeding per rectum.  No dyspnea or cough or wheezing or hemoptysis.  No dysuria, oliguria or hematuria or flank pain.  No other bleeding diathesis.  She denies any fever or chills.  Hospital Course / Discharge diagnoses: Principal Problem:   GI bleeding Active Problems:   Intractable nausea and vomiting   Acute colitis   GERD without esophagitis   Essential hypertension   Dyslipidemia   Type 2 diabetes mellitus without complications ([place])   Principal problem Recurrent severe gastroparesis-patient has been having recurrent hospitalizations for this, more so in the past year and half.  She has been followed closely by gastroenterology as an outpatient, seeing Dr. Havery Moros on a regular basis.  GI also consulted  and saw patient in the hospital.  She has been on Reglan for some time but not adequately controlling her symptoms.  She has been referred to tertiary centers to be considered for gastric pacemaker, and has been referred to [facility], [facility] and [facility] but per GI she has been declined at all centers.  GI will reach out to [facility] again.  Due to QT prolongation, which was found to be new during this hospital stay, her medication options are somewhat limited.  QT will be monitored as an outpatient and GI will consider domperidone in the future.  During this hospitalization, she underwent an EGD which showed normal esophagus, stomach, duodenum, but there was a clinical question of pyloric spasm status post botulinum injection.  With supportive care she eventually improved, is able to tolerate a regular diet and will be discharged home in stable condition.  For now continue PPI, antiemetics  Active problems Diabetes mellitus, poorly controlled, with hyperglycemia- Outpatient follow-up with endocrinology  Lab Results  Component Value Date   HGBA1C 12.2 (H) 08/26/2021   Essential hypertension-continue home medications Prolonged QT-normalized after discontinuation of Reglan Hyperlipidemia-continue statin Questionable colitis on CT scan-clinically without symptoms.  Colonoscopy as an outpatient per GI  Discharge Instructions   Allergies as of 08/27/2021   No Known Allergies      Medication List     STOP taking these medications    metoCLOPramide 10 MG tablet Commonly known as: Reglan   oxyCODONE 5 MG immediate release tablet Commonly known as: Oxy IR/ROXICODONE   promethazine  12.5 MG tablet Commonly known as: PHENERGAN       TAKE these medications    albuterol 108 (90 Base) MCG/ACT inhaler Commonly known as: VENTOLIN HFA Inhale 2 puffs into the lungs every 6 (six) hours as needed for shortness of breath.   atorvastatin 20 MG tablet Commonly known as: LIPITOR Take 1 tablet (20  mg total) by mouth daily.   blood glucose meter kit and supplies Dispense based on patient and insurance preference. Use up to four times daily as directed. (FOR ICD-10 E10.9, E11.9).   blood glucose meter kit and supplies Dispense based on patient and insurance preference. Use up to four times daily as directed. (FOR ICD-10 E10.9, E11.9).   carvedilol 6.25 MG tablet Commonly known as: COREG Take 1 tablet (6.25 mg total) by mouth 2 (two) times daily with a meal.   Dexcom G6 Sensor Misc 1 each by Does not apply route daily.   Dexcom G6 Transmitter Misc 1 each by Does not apply route daily.   dicyclomine 20 MG tablet Commonly known as: BENTYL Take 1 tablet (20 mg total) by mouth 3 (three) times daily as needed for spasms.   hydrOXYzine 25 MG capsule Commonly known as: VISTARIL Take 1 capsule (25 mg total) by mouth every 8 (eight) hours as needed. What changed: reasons to take this   insulin regular 100 units/mL injection Commonly known as: NovoLIN R Inject 0-0.15 mLs (0-15 Units total) into the skin See admin instructions. Via pump.Start this medication after you follow up with your PCP What changed: additional instructions   Levemir FlexTouch 100 UNIT/ML FlexPen Generic drug: insulin detemir Inject 16 Units into the skin daily. What changed: how much to take   ondansetron 4 MG tablet Commonly known as: Zofran Take 1 tablet (4 mg total) by mouth every 8 (eight) hours as needed for nausea or vomiting.   pantoprazole 40 MG tablet Commonly known as: PROTONIX Take 1 tablet (40 mg total) by mouth 2 (two) times daily before a meal.   Pen Needles 3/16" 31G X 5 MM Misc 1 each by Does not apply route in the morning, at noon, and at bedtime.   prochlorperazine 10 MG tablet Commonly known as: COMPAZINE Take 0.5-1 tablets (5-10 mg total) by mouth every 8 (eight) hours as needed for nausea or vomiting.   True Metrix Blood Glucose Test test strip Generic drug: glucose blood Use  as instructed What changed:  how much to take how to take this when to take this   TRUEplus Lancets 28G Misc assist with checking blood sugar TID and qhs What changed:  how much to take how to take this when to take this         Consultations: GI  Procedures/Studies:  CT ABDOMEN PELVIS W CONTRAST  Result Date: 08/24/2021 CLINICAL DATA:  Abdominal pain, acute, nonlocalized. History of gastroparesis. EXAM: CT ABDOMEN AND PELVIS WITH CONTRAST TECHNIQUE: Multidetector CT imaging of the abdomen and pelvis was performed using the standard protocol following bolus administration of intravenous contrast. RADIATION DOSE REDUCTION: This exam was performed according to the departmental dose-optimization program which includes automated exposure control, adjustment of the mA and/or kV according to patient size and/or use of iterative reconstruction technique. CONTRAST:  17m OMNIPAQUE IOHEXOL 300 MG/ML  SOLN COMPARISON:  08/17/2021. FINDINGS: Lower chest: No acute abnormality. Hepatobiliary: No focal liver abnormality is seen. No gallstones, gallbladder wall thickening, or biliary dilatation. Pancreas: Unremarkable. No pancreatic ductal dilatation or surrounding inflammatory changes. Spleen: Normal in size  without focal abnormality. Adrenals/Urinary Tract: Adrenal glands are unremarkable. Kidneys are normal, without renal calculi, focal lesion, or hydronephrosis. Bladder is unremarkable. Stomach/Bowel: The stomach is within normal limits. No bowel obstruction, free air, or pneumatosis. A few scattered diverticula are present along the colon without evidence of diverticulitis. The proximal appendix is prominent in size and air-filled at 1 cm, not significantly changed from the prior exam. No surrounding inflammatory changes are identified. There is mild diffuse colonic wall thickening with surrounding fat stranding. Vascular/Lymphatic: No significant vascular findings are present. No enlarged abdominal  or pelvic lymph nodes. Reproductive: Uterus and bilateral adnexa are unremarkable. Other: No abdominopelvic ascites. Musculoskeletal: No acute or significant osseous findings. IMPRESSION: 1. Mild diffuse colonic wall thickening with surrounding fat stranding suggesting colitis. 2. Prominent appendix with no surrounding inflammatory changes. Findings are similar in appearance to the prior exam and likely reflect a normal variant. Correlate clinically to exclude appendicitis. Electronically Signed   By: Brett Fairy M.D.   On: 08/24/2021 22:38   CT ABDOMEN PELVIS W CONTRAST  Result Date: 08/17/2021 CLINICAL DATA:  Nonlocalizing abdominal pain. EXAM: CT ABDOMEN AND PELVIS WITH CONTRAST TECHNIQUE: Multidetector CT imaging of the abdomen and pelvis was performed using the standard protocol following bolus administration of intravenous contrast. RADIATION DOSE REDUCTION: This exam was performed according to the departmental dose-optimization program which includes automated exposure control, adjustment of the mA and/or kV according to patient size and/or use of iterative reconstruction technique. CONTRAST:  151m OMNIPAQUE IOHEXOL 300 MG/ML  SOLN COMPARISON:  CTs with IV contrast 04/21/2021 and 06/05/2020. FINDINGS: Lower chest: No acute abnormality. Hepatobiliary: No focal liver abnormality is seen. No gallstones, gallbladder wall thickening, or biliary dilatation. Pancreas: No focal abnormality or adjacent inflammation. Spleen: Normal in size without focal abnormality. Adrenals/Urinary Tract: No renal or adrenal mass enhancement is seen and no calculus or urinary obstruction. Distended bladder with normal wall thickness. Stomach/Bowel: Thickened folds are again noted in the stomach and jejunum. There is no small bowel obstruction or inflammation. The appendix is again noted distended and partially stool-filled with elongate course from the medial cecal base posterior inferiorly into the pelvis with the tip in the  right perirectal space. The appendix measures up to 1.2 cm in the proximal to mid segment tapering to normal distally. Similar findings were noted on the prior studies and this is probably simply normal for the patient. There are no inflammatory changes. There is either wall thickening or underdistention in the ascending, transverse and descending colon. There are scattered sigmoid diverticula without inflammatory change. The rectal wall contracted and not well seen. Vascular/Lymphatic: No significant vascular findings are present. No enlarged abdominal or pelvic lymph nodes. Reproductive: Uterus and bilateral adnexa are unremarkable. Other: There is no free air, hemorrhage or fluid. There is no incarcerated hernia. There are clustered air pockets in the right mid abdominal wall probably due to a recent injection. Musculoskeletal: No acute or significant osseous findings. IMPRESSION: 1. Gastroenteritis, similar to prior studies with no small-bowel obstruction or inflammation. 2. Distended and partially stool-filled appendix but similar to prior studies and no inflammatory change. Probable normal variant. Correlate clinically to exclude early appendicitis. 3. Ascending, transverse and descending colitis versus nondistention. Electronically Signed   By: KTelford NabM.D.   On: 08/17/2021 07:12     Subjective: - no chest pain, shortness of breath, no abdominal pain, nausea or vomiting.   Discharge Exam: BP (!) 140/97 (BP Location: Right Arm)   Pulse (!) 101  Temp 98.2 F (36.8 C) (Oral)   Resp 18   Ht 5' 7"  (1.702 m)   Wt 78 kg   SpO2 98%   BMI 26.93 kg/m   General: Pt is alert, awake, not in acute distress Cardiovascular: RRR, S1/S2 +, no rubs, no gallops Respiratory: CTA bilaterally, no wheezing, no rhonchi Abdominal: Soft, NT, ND, bowel sounds + Extremities: no edema, no cyanosis  The results of significant diagnostics from this hospitalization (including imaging, microbiology, ancillary  and laboratory) are listed below for reference.    Microbiology: No results found for this or any previous visit (from the past 240 hour(s)).   Labs: Basic Metabolic Panel: Recent Labs  Lab 08/24/21 1858 08/24/21 1916 08/25/21 0500 08/26/21 0452 08/27/21 0729  NA 135 136 137 138 137  K 4.6 4.1 4.3 4.2 4.3  CL 99 98 100 104 105  CO2  --  22 26 26 25   GLUCOSE 399* 383* 318* 145* 230*  BUN 31* 27* 29* 16 10  CREATININE 1.20* 1.21* 1.26* 0.93 0.89  CALCIUM  --  9.7 8.9 9.2 8.9   Liver Function Tests: Recent Labs  Lab 08/24/21 1916 08/26/21 0452  AST 20 16  ALT 19 14  ALKPHOS 93 65  BILITOT 1.3* 1.0  PROT 8.2* 6.2*  ALBUMIN 3.6 2.8*   CBC: Recent Labs  Lab 08/24/21 1858 08/24/21 1916 08/25/21 0500 08/26/21 0452 08/27/21 0729  WBC  --  9.9 11.7* 9.0 6.9  NEUTROABS  --  7.7  --   --   --   HGB 12.6 12.0 10.4* 10.5* 10.5*  HCT 37.0 36.7 32.1* 32.3* 31.4*  MCV  --  88.9 89.2 89.7 89.2  PLT  --  450* 382 370 325   CBG: Recent Labs  Lab 08/26/21 1251 08/26/21 1701 08/26/21 2030 08/27/21 0740 08/27/21 1133  GLUCAP 165* 209* 289* 238* 242*   Hgb A1c Recent Labs    08/26/21 0452  HGBA1C 12.2*   Lipid Profile No results for input(s): "CHOL", "HDL", "LDLCALC", "TRIG", "CHOLHDL", "LDLDIRECT" in the last 72 hours. Thyroid function studies Recent Labs    08/26/21 0452  TSH 1.683   Urinalysis    Component Value Date/Time   COLORURINE STRAW (A) 08/24/2021 2129   APPEARANCEUR CLEAR 08/24/2021 2129   LABSPEC 1.020 08/24/2021 2129   PHURINE 6.0 08/24/2021 2129   GLUCOSEU >=500 (A) 08/24/2021 2129   GLUCOSEU NEGATIVE 05/22/2020 1417   HGBUR SMALL (A) 08/24/2021 2129   BILIRUBINUR NEGATIVE 08/24/2021 2129   KETONESUR 80 (A) 08/24/2021 2129   PROTEINUR >=300 (A) 08/24/2021 2129   UROBILINOGEN 0.2 05/22/2020 1417   NITRITE NEGATIVE 08/24/2021 2129   LEUKOCYTESUR NEGATIVE 08/24/2021 2129    FURTHER DISCHARGE INSTRUCTIONS:   Get Medicines reviewed and  adjusted: Please take all your medications with you for your next visit with your Primary MD   Laboratory/radiological data: Please request your Primary MD to go over all hospital tests and procedure/radiological results at the follow up, please ask your Primary MD to get all Hospital records sent to his/her office.   In some cases, they will be blood work, cultures and biopsy results pending at the time of your discharge. Please request that your primary care M.D. goes through all the records of your hospital data and follows up on these results.   Also Note the following: If you experience worsening of your admission symptoms, develop shortness of breath, life threatening emergency, suicidal or homicidal thoughts you must seek medical attention immediately by  calling 911 or calling your MD immediately  if symptoms less severe.   You must read complete instructions/literature along with all the possible adverse reactions/side effects for all the Medicines you take and that have been prescribed to you. Take any new Medicines after you have completely understood and accpet all the possible adverse reactions/side effects.    Do not drive when taking Pain medications or sleeping medications (Benzodaizepines)   Do not take more than prescribed Pain, Sleep and Anxiety Medications. It is not advisable to combine anxiety,sleep and pain medications without talking with your primary care practitioner   Special Instructions: If you have smoked or chewed Tobacco  in the last 2 yrs please stop smoking, stop any regular Alcohol  and or any Recreational drug use.   Wear Seat belts while driving.   Please note: You were cared for by a hospitalist during your hospital stay. Once you are discharged, your primary care physician will handle any further medical issues. Please note that NO REFILLS for any discharge medications will be authorized once you are discharged, as it is imperative that you return to your  primary care physician (or establish a relationship with a primary care physician if you do not have one) for your post hospital discharge needs so that they can reassess your need for medications and monitor your lab values.  Time coordinating discharge: 40 minutes  SIGNED:  Marzetta Board, MD, PhD 08/27/2021, 2:51 PM

## 2021-08-28 ENCOUNTER — Ambulatory Visit: Payer: 59 | Admitting: Internal Medicine

## 2021-08-30 ENCOUNTER — Ambulatory Visit (INDEPENDENT_AMBULATORY_CARE_PROVIDER_SITE_OTHER): Payer: 59 | Admitting: Family Medicine

## 2021-08-30 VITALS — BP 116/64 | HR 107 | Temp 97.7°F | Ht 67.0 in | Wt 187.6 lb

## 2021-08-30 DIAGNOSIS — R29898 Other symptoms and signs involving the musculoskeletal system: Secondary | ICD-10-CM | POA: Diagnosis not present

## 2021-08-30 NOTE — Patient Instructions (Signed)
If jaw painful, use topical ketorolac (Voltaren) gel twice a day.

## 2021-08-30 NOTE — Progress Notes (Signed)
[facility] [address] [place] [address] Dept: [phone] Dept Fax: [phone]  Office Visit  Subjective:    Patient ID: Kelli Hudson, female    DOB: [birth year], 31 y.o..   MRN: [id number]  Chief Complaint  Patient presents with   Acute Visit    C/o having jaw popping x 2 days.      History of Present Illness:  Patient is in today noting an audible popping in her jaw for the past two days. This has not been significantly painful. She doesn't recall this before. She does not chew gum. She has been trying to avoid opening her mouth too wide.  Past Medical History: Patient Active Problem List   Diagnosis Date Noted   Acute colitis 08/25/2021   Essential hypertension 08/25/2021   Dyslipidemia 08/25/2021   Type 2 diabetes mellitus without complications ([place]) 56/38/7564   GERD without esophagitis 08/25/2021   Coffee ground emesis    Prolonged Q-T interval on ECG    GI bleeding 08/24/2021   Gastroparesis 07/30/2021   Nausea and vomiting 07/20/2021   Hyperglycemia due to diabetes mellitus ([place]) 06/11/2021   Hyperglycemia 06/10/2021   Anemia of chronic disease 06/06/2021   Intractable vomiting with nausea 06/04/2021   Dehydration 06/04/2021   Hypomagnesemia 04/21/2021   Intractable nausea and vomiting 04/20/2021   Ulcerated, foot, left, with fat layer exposed ([place]) 04/20/2021   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 ([place]) 04/10/2021   DKA (diabetic ketoacidosis) ([place]) 02/24/2021   Type 1 diabetes mellitus with diabetic polyneuropathy ([place]) 12/18/2020   Uncontrolled type 1 diabetes mellitus with hyperglycemia, with long-term current use of insulin ([place]) 12/18/2020   DM (diabetes mellitus), type 1 ([place]) 12/18/2020   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 pylori infection 10/07/2019   Depression with anxiety 07/05/2019   Diabetic polyneuropathy associated with type 1 diabetes mellitus ([place]) 09/24/2017   Leukocytosis    Goiter 10/05/2009   Hypertension associated with diabetes ([place]) 10/05/2009   Past Surgical History:  Procedure Laterality Date   AMPUTATION TOE Left 03/10/2018   Procedure: AMPUTATION FIFTH TOE;  Surgeon: Trula Slade, DPM;  Location: [facility];  Service: Podiatry;  Laterality: Left;   BIOPSY  01/28/2020   Procedure: BIOPSY;  Surgeon: Otis Brace, MD;  Location: [facility];  Service: Gastroenterology;;   BOTOX INJECTION  08/26/2021   Procedure: BOTOX INJECTION;  Surgeon: Doran Stabler, MD;  Location: [facility];  Service: Gastroenterology;;   ESOPHAGOGASTRODUODENOSCOPY N/A 01/28/2020   Procedure: ESOPHAGOGASTRODUODENOSCOPY (EGD);  Surgeon: Otis Brace, MD;  Location: [facility];  Service: Gastroenterology;  Laterality: N/A;   ESOPHAGOGASTRODUODENOSCOPY (EGD) WITH PROPOFOL Left 09/08/2015   Procedure: ESOPHAGOGASTRODUODENOSCOPY (EGD) WITH PROPOFOL;  Surgeon: Arta Silence, MD;  Location: [facility]COPY;  Service: Endoscopy;  Laterality: Left;   ESOPHAGOGASTRODUODENOSCOPY (EGD) WITH PROPOFOL N/A 08/26/2021   Procedure: ESOPHAGOGASTRODUODENOSCOPY (EGD) WITH PROPOFOL;  Surgeon: Doran Stabler, MD;  Location: [facility];  Service: Gastroenterology;  Laterality: N/A;   WISDOM TOOTH EXTRACTION     Family History  Problem Relation Age of Onset   Lung cancer  Mother    Diabetes Mother    Bipolar disorder Father    Liver cancer Maternal Grandfather    Diabetes Maternal Aunt        x 2   Pancreatic cancer Maternal Uncle    Prostate cancer Maternal Uncle    Breast cancer Other        maternal great aunt   Heart disease Other    Ovarian cancer Other        maternal great aunt   Kidney disease Other        maternal great aunt   Colon cancer Neg Hx    Stomach cancer Neg Hx    Outpatient Medications Prior to Visit  Medication Sig Dispense Refill   albuterol (VENTOLIN HFA) 108 (90 Base) MCG/ACT inhaler Inhale 2 puffs into the lungs every 6 (six) hours as needed for shortness of breath.     atorvastatin (LIPITOR) 20 MG tablet Take 1 tablet (20 mg total) by mouth daily. 90 tablet 3   blood glucose meter kit and supplies Dispense based on patient and insurance preference. Use up to four times daily as directed. (FOR ICD-10 E10.9, E11.9). 1 each 0   blood glucose meter kit and supplies Dispense based on patient and insurance preference. Use up to four times daily as directed. (FOR ICD-10 E10.9, E11.9). 1 each 0   carvedilol (COREG) 6.25 MG tablet Take 1 tablet (6.25 mg total) by mouth 2 (two) times daily with a meal. 30 tablet 2   Continuous Blood Gluc Sensor (DEXCOM G6 SENSOR) MISC 1 each by Does not apply route daily. 3 each 12   Continuous Blood Gluc Transmit (DEXCOM G6 TRANSMITTER) MISC 1 each by Does not apply route daily. 1 each 3   dicyclomine (BENTYL) 20 MG tablet Take 1 tablet (20 mg total) by mouth 3 (three) times daily as needed for spasms. 30 tablet 0   glucose blood (TRUE METRIX BLOOD GLUCOSE TEST) test strip Use as instructed (Patient taking differently: 1 each by Other route See admin instructions. Use as instructed) 100 each 12   hydrOXYzine (VISTARIL) 25 MG capsule Take 1 capsule (25 mg total) by mouth every 8 (eight) hours as needed. (Patient taking differently: Take 25 mg by mouth every 8 (eight) hours as needed for anxiety.) 30  capsule 0   insulin detemir (LEVEMIR FLEXTOUCH) 100 UNIT/ML FlexPen Inject 16 Units into the skin daily. (Patient taking differently: Inject 18 Units into the skin daily.) 10 mL 0   Insulin Pen Needle (PEN NEEDLES 3/16") 31G X 5 MM MISC 1 each by Does not apply route in the morning, at noon, and at bedtime. 100 each 0   insulin regular (NOVOLIN R) 100 units/mL injection Inject 0-0.15 mLs (0-15 Units total) into the skin See admin instructions. Via pump.Start this medication after you follow up with your PCP (Patient taking differently: Inject 0-15 Units into the skin See admin instructions. Via pump.) 30 mL 1   ondansetron (ZOFRAN) 4 MG tablet Take 1 tablet (4 mg total) by mouth every 8 (eight) hours as needed for nausea or vomiting. 30 tablet 0   pantoprazole (PROTONIX) 40 MG tablet Take 1 tablet (40 mg total) by mouth 2 (two) times daily before a meal. 60 tablet 2   prochlorperazine (COMPAZINE) 10 MG tablet Take 1/2-1 tablet (5-10 mg   total) by mouth every 8 (eight) hours as needed for nausea or vomiting. 30 tablet 0   TRUEPLUS LANCETS 28G MISC assist with checking blood sugar TID and qhs (Patient taking differently: 1 each by Other route See admin instructions. assist with checking blood sugar TID and qhs) 100 each 3   No facility-administered medications prior to visit.   No Known Allergies    Objective:   Today's Vitals   08/30/21 1430  BP: 116/64  Pulse: (!) 107  Temp: 97.7 F (36.5 C)  TempSrc: Temporal  SpO2: 98%  Weight: 187 lb 9.6 oz (85.1 kg)  Height: 5' 7" (1.702 m)   Body mass index is 29.38 kg/m.   General: Well developed, well nourished. No acute distress. HEENT: Normocephalic, non-traumatic. There is an audible click when she opens her mouth wide. I can   palpate that both TMJs are subluxing anteriorly with this. There is no swelling over the joint. She has no pain  with palpation. Psych: Alert and oriented. Normal mood and affect.  Health Maintenance Due  Topic  Date Due   HPV VACCINES (3 - 3-dose series) 11/16/2009   FOOT EXAM  05/22/2021   URINE MICROALBUMIN  05/22/2021     Assessment & Plan:   1. TMJ click As this is an acute issue, This may improve with time. She should avoid opening the jaw wide enough to cause the click. If this does become tender or swollen, I recommend she use hot compresses over the joint. She might also try using Voltaren gel twice a day. Follow up if this becomes progressive.   Return in about 3 months (around 11/30/2021) for Reassessment.    M , MD 

## 2021-09-01 ENCOUNTER — Other Ambulatory Visit: Payer: Self-pay | Admitting: Family Medicine

## 2021-09-01 DIAGNOSIS — I10 Essential (primary) hypertension: Secondary | ICD-10-CM

## 2021-09-15 ENCOUNTER — Other Ambulatory Visit: Payer: Self-pay | Admitting: Family Medicine

## 2021-09-15 DIAGNOSIS — E1165 Type 2 diabetes mellitus with hyperglycemia: Secondary | ICD-10-CM

## 2021-09-20 ENCOUNTER — Other Ambulatory Visit: Payer: Self-pay

## 2021-09-20 ENCOUNTER — Observation Stay (HOSPITAL_COMMUNITY)
Admission: EM | Admit: 2021-09-20 | Discharge: 2021-09-25 | Disposition: A | Discharge status: Home / Self Care | Payer: 59 | Attending: Family Medicine | Admitting: Family Medicine

## 2021-09-20 ENCOUNTER — Encounter (HOSPITAL_COMMUNITY): Payer: Self-pay

## 2021-09-20 DIAGNOSIS — Z89422 Acquired absence of other left toe(s): Secondary | ICD-10-CM

## 2021-09-20 DIAGNOSIS — K219 Gastro-esophageal reflux disease without esophagitis: Secondary | ICD-10-CM | POA: Diagnosis not present

## 2021-09-20 DIAGNOSIS — Z833 Family history of diabetes mellitus: Secondary | ICD-10-CM

## 2021-09-20 DIAGNOSIS — R112 Nausea with vomiting, unspecified: Secondary | ICD-10-CM | POA: Diagnosis not present

## 2021-09-20 DIAGNOSIS — Z794 Long term (current) use of insulin: Secondary | ICD-10-CM

## 2021-09-20 DIAGNOSIS — Z765 Malingerer [conscious simulation]: Secondary | ICD-10-CM

## 2021-09-20 DIAGNOSIS — Z79899 Other long term (current) drug therapy: Secondary | ICD-10-CM

## 2021-09-20 DIAGNOSIS — K3184 Gastroparesis: Secondary | ICD-10-CM | POA: Diagnosis not present

## 2021-09-20 DIAGNOSIS — E1042 Type 1 diabetes mellitus with diabetic polyneuropathy: Secondary | ICD-10-CM | POA: Diagnosis not present

## 2021-09-20 DIAGNOSIS — R000 Tachycardia, unspecified: Secondary | ICD-10-CM | POA: Diagnosis not present

## 2021-09-20 DIAGNOSIS — Z91199 Patient's noncompliance with other medical treatment and regimen due to unspecified reason: Secondary | ICD-10-CM

## 2021-09-20 DIAGNOSIS — Z8711 Personal history of peptic ulcer disease: Secondary | ICD-10-CM

## 2021-09-20 DIAGNOSIS — I10 Essential (primary) hypertension: Secondary | ICD-10-CM | POA: Diagnosis present

## 2021-09-20 DIAGNOSIS — Z7289 Other problems related to lifestyle: Secondary | ICD-10-CM | POA: Insufficient documentation

## 2021-09-20 DIAGNOSIS — E1059 Type 1 diabetes mellitus with other circulatory complications: Secondary | ICD-10-CM | POA: Diagnosis not present

## 2021-09-20 DIAGNOSIS — E1159 Type 2 diabetes mellitus with other circulatory complications: Secondary | ICD-10-CM | POA: Diagnosis present

## 2021-09-20 DIAGNOSIS — Z8619 Personal history of other infectious and parasitic diseases: Secondary | ICD-10-CM

## 2021-09-20 DIAGNOSIS — E1043 Type 1 diabetes mellitus with diabetic autonomic (poly)neuropathy: Principal | ICD-10-CM | POA: Insufficient documentation

## 2021-09-20 DIAGNOSIS — E0590 Thyrotoxicosis, unspecified without thyrotoxic crisis or storm: Secondary | ICD-10-CM | POA: Diagnosis present

## 2021-09-20 DIAGNOSIS — E1069 Type 1 diabetes mellitus with other specified complication: Secondary | ICD-10-CM | POA: Diagnosis present

## 2021-09-20 DIAGNOSIS — E1065 Type 1 diabetes mellitus with hyperglycemia: Secondary | ICD-10-CM | POA: Diagnosis present

## 2021-09-20 DIAGNOSIS — E1143 Type 2 diabetes mellitus with diabetic autonomic (poly)neuropathy: Secondary | ICD-10-CM | POA: Diagnosis present

## 2021-09-20 DIAGNOSIS — I152 Hypertension secondary to endocrine disorders: Secondary | ICD-10-CM | POA: Diagnosis present

## 2021-09-20 DIAGNOSIS — N179 Acute kidney failure, unspecified: Secondary | ICD-10-CM | POA: Diagnosis present

## 2021-09-20 DIAGNOSIS — R109 Unspecified abdominal pain: Secondary | ICD-10-CM | POA: Diagnosis present

## 2021-09-20 LAB — CBC WITH DIFFERENTIAL/PLATELET
Abs Immature Granulocytes: 0.01 10*3/uL (ref 0.00–0.07)
Basophils Absolute: 0 10*3/uL (ref 0.0–0.1)
Basophils Relative: 1 %
Eosinophils Absolute: 0.1 10*3/uL (ref 0.0–0.5)
Eosinophils Relative: 1 %
HCT: 39.1 % (ref 36.0–46.0)
Hemoglobin: 12.8 g/dL (ref 12.0–15.0)
Immature Granulocytes: 0 %
Lymphocytes Relative: 20 %
Lymphs Abs: 1.4 10*3/uL (ref 0.7–4.0)
MCH: 28.7 pg (ref 26.0–34.0)
MCHC: 32.7 g/dL (ref 30.0–36.0)
MCV: 87.7 fL (ref 80.0–100.0)
Monocytes Absolute: 0.4 10*3/uL (ref 0.1–1.0)
Monocytes Relative: 6 %
Neutro Abs: 5.1 10*3/uL (ref 1.7–7.7)
Neutrophils Relative %: 72 %
Platelets: 454 10*3/uL — ABNORMAL HIGH (ref 150–400)
RBC: 4.46 MIL/uL (ref 3.87–5.11)
RDW: 13.2 % (ref 11.5–15.5)
WBC: 7 10*3/uL (ref 4.0–10.5)
nRBC: 0 % (ref 0.0–0.2)

## 2021-09-20 LAB — MAGNESIUM: Magnesium: 1.9 mg/dL (ref 1.7–2.4)

## 2021-09-20 LAB — BLOOD GAS, VENOUS
Acid-base deficit: 4.2 mmol/L — ABNORMAL HIGH (ref 0.0–2.0)
Bicarbonate: 20.1 mmol/L (ref 20.0–28.0)
O2 Saturation: 81.7 %
Patient temperature: 37.1
pCO2, Ven: 34 mmHg — ABNORMAL LOW (ref 44–60)
pH, Ven: 7.38 (ref 7.25–7.43)
pO2, Ven: 53 mmHg — ABNORMAL HIGH (ref 32–45)

## 2021-09-20 LAB — COMPREHENSIVE METABOLIC PANEL
ALT: 19 U/L (ref 0–44)
AST: 22 U/L (ref 15–41)
Albumin: 3.6 g/dL (ref 3.5–5.0)
Alkaline Phosphatase: 92 U/L (ref 38–126)
Anion gap: 14 (ref 5–15)
BUN: 27 mg/dL — ABNORMAL HIGH (ref 6–20)
CO2: 21 mmol/L — ABNORMAL LOW (ref 22–32)
Calcium: 9.9 mg/dL (ref 8.9–10.3)
Chloride: 101 mmol/L (ref 98–111)
Creatinine, Ser: 1.06 mg/dL — ABNORMAL HIGH (ref 0.44–1.00)
GFR, Estimated: >60 mL/min (ref >60)
Glucose, Bld: 430 mg/dL — ABNORMAL HIGH (ref 70–99)
Potassium: 4.5 mmol/L (ref 3.5–5.1)
Sodium: 136 mmol/L (ref 135–145)
Total Bilirubin: 1.1 mg/dL (ref 0.3–1.2)
Total Protein: 8.2 g/dL — ABNORMAL HIGH (ref 6.5–8.1)

## 2021-09-20 LAB — LIPASE, BLOOD: Lipase: 23 U/L (ref 11–51)

## 2021-09-20 MED ORDER — HYDROMORPHONE HCL 1 MG/ML IJ SOLN
1.0000 mg | Freq: Once | INTRAMUSCULAR | Status: AC
  Administered 2021-09-20: 1 mg via INTRAVENOUS
  Filled 2021-09-20: qty 1

## 2021-09-20 MED ORDER — LACTATED RINGERS IV BOLUS
1000.0000 mL | Freq: Once | INTRAVENOUS | Status: AC
  Administered 2021-09-21: 1000 mL via INTRAVENOUS

## 2021-09-20 MED ORDER — LACTATED RINGERS IV BOLUS
1000.0000 mL | Freq: Once | INTRAVENOUS | Status: AC
  Administered 2021-09-20: 1000 mL via INTRAVENOUS

## 2021-09-20 NOTE — ED Provider Notes (Signed)
[facility] Emergency Department Provider Note MRN:  [id number]  Arrival date & time: 09/21/21     Chief Complaint   Abdominal Pain   History of Present Illness   Kelli Hudson is a 31 y.o. year-old female presents to the ED with chief complaint of vomiting and abdominal pain.  Hx of diabetic gastroparesis.  Type 1 DM.  Doesn't use a pump.  States that she has been vomiting for the past 2 days.  She states she's not been able to keep anything down.  History provided by patient.   Review of Systems  Pertinent review of systems noted in HPI.    Physical Exam   Vitals:   09/21/21 0200 09/21/21 0252  BP: (!) 178/119   Pulse: (!) 132   Resp: 14   Temp:  98 F (36.7 C)  SpO2: 100%     CONSTITUTIONAL:  uncomfortable-appearing, NAD NEURO:  Alert and oriented x 3, CN 3-12 grossly intact EYES:  eyes equal and reactive ENT/NECK:  Supple, no stridor  CARDIO:  tachycardic, regular rhythm, appears well-perfused  PULM:  No respiratory distress, CTAB GI/GU:  non-distended, generalized abdominal discomfort MSK/SPINE:  No gross deformities, no edema, moves all extremities  SKIN:  no rash, atraumatic   *Additional and/or pertinent findings included in MDM below  Diagnostic and Interventional Summary    EKG Interpretation  Date/Time:  Friday September 20 2021 22:57:55 EDT Ventricular Rate:  128 PR Interval:  158 QRS Duration: 78 QT Interval:  301 QTC Calculation: 440 R Axis:   69 Text Interpretation: Sinus tachycardia LAE, consider biatrial enlargement Abnormal Q suggests anterior infarct Nonspecific T abnormalities, lateral leads Confirmed by Zadie Rhine ([id number]) on 09/20/2021 11:35:20 PM       Labs Reviewed  CBC WITH DIFFERENTIAL/PLATELET - Abnormal; Notable for the following components:      Result Value   Platelets 454 (*)    All other components within normal limits  COMPREHENSIVE METABOLIC PANEL - Abnormal; Notable for the following  components:   CO2 21 (*)    Glucose, Bld 430 (*)    BUN 27 (*)    Creatinine, Ser 1.06 (*)    Total Protein 8.2 (*)    All other components within normal limits  URINALYSIS, ROUTINE W REFLEX MICROSCOPIC - Abnormal; Notable for the following components:   Glucose, UA >=500 (*)    Hgb urine dipstick SMALL (*)    Ketones, ur 80 (*)    Protein, ur >=300 (*)    All other components within normal limits  BLOOD GAS, VENOUS - Abnormal; Notable for the following components:   pCO2, Ven 34 (*)    pO2, Ven 53 (*)    Acid-base deficit 4.2 (*)    All other components within normal limits  CBG MONITORING, ED - Abnormal; Notable for the following components:   Glucose-Capillary 324 (*)    All other components within normal limits  LIPASE, BLOOD  MAGNESIUM    No orders to display    Medications  lactated ringers bolus 1,000 mL (has no administration in time range)  HYDROmorphone (DILAUDID) injection 1 mg (1 mg Intravenous Given 09/20/21 2313)  lactated ringers bolus 1,000 mL (1,000 mLs Intravenous Bolus 09/20/21 2313)  lactated ringers bolus 1,000 mL (1,000 mLs Intravenous Bolus 09/21/21 0123)  HYDROmorphone (DILAUDID) injection 1 mg (1 mg Intravenous Given 09/21/21 0123)  metoCLOPramide (REGLAN) injection 10 mg (10 mg Intravenous Given 09/21/21 0123)  morphine (PF) 4 MG/ML injection 4 mg (4 mg  Intravenous Given 09/21/21 0200)  pantoprazole (PROTONIX) injection 40 mg (40 mg Intravenous Given 09/21/21 0200)     Procedures  /  Critical Care Procedures  ED Course and Medical Decision Making  I have reviewed the triage vital signs, the nursing notes, and pertinent available records from the EMR.  Social Determinants Affecting Complexity of Care: Patient has no clinically significant social determinants affecting this chief complaint..   ED Course: Clinical Course as of 09/21/21 0255  Sat Sep 21, 2021  0145 Patient still having pain, still tachycardic.  She has received 2 rounds of dilaudid and 2  liters of fluid.  Will give a dose of morphine and another liter of fluid.  She might benefit from some haldol, but given her hx of QT prolongation, this is held. [RB]    Clinical Course User Index [RB] Roxy Horseman, PA-C    Medical Decision Making Patient here with nausea and vomiting.  Has long history of gastroparesis secondary to diabetes.  We will treat with fluids, will give pain medicine for abdominal pain and nausea medicine.  Will reassess.  Laboratory work-up is notable for normal pH, glucose is elevated at 430 on chemistry, will continue treatment with fluids.    Magnesium is 1.9, lipase 23.  Problems Addressed: Gastroparesis: acute illness or injury  Amount and/or Complexity of Data Reviewed Labs: ordered. ECG/medicine tests: ordered.  Risk Prescription drug management. Decision regarding hospitalization.     Consultants: I discussed the case with Hospitalist, Dr. Imogene Burn, who is appreciated for admitting.   Treatment and Plan: Patient's exam and diagnostic results are concerning for intractable nausea and vomiting 2/2 diabetic gastroparesis.  Feel that patient will need admission to the hospital for further treatment and evaluation.    Final Clinical Impressions(s) / ED Diagnoses     ICD-10-CM   1. Gastroparesis  K31.84       ED Discharge Orders     None         Discharge Instructions Discussed with and Provided to Patient:   Discharge Instructions   None      Roxy Horseman, PA-C 09/21/21 0255    Pricilla Loveless, MD 09/23/21 [phone]

## 2021-09-20 NOTE — ED Triage Notes (Signed)
Generalized abd pain/n/v x2 days  Hx gastroparesis being admitted 3 weeks ago for the same.

## 2021-09-20 NOTE — ED Provider Triage Note (Signed)
Emergency Medicine Provider Triage Evaluation Note  Kelli Hudson , a 31 y.o. female  was evaluated in triage.  Pt complains of abdominal pain, nausea, vomiting.  Onset yesterday.  Has history of gastroparesis.  Denies fever.  Review of Systems  Positive: As above Negative: As above  Physical Exam  BP (!) 181/127   Pulse (!) 122   Temp 98.3 F (36.8 C)   Resp 20   Ht 5\' 8"  (1.727 m)   Wt 81.6 kg   SpO2 100%   BMI 27.37 kg/m  Gen:   Awake, no distress   Resp:  Normal effort MSK:   Moves extremities without difficulty  Other:    Medical Decision Making  Medically screening exam initiated at 7:12 PM.  Appropriate orders placed.  Kelli Hudson was informed that the remainder of the evaluation will be completed by another provider, this initial triage assessment does not replace that evaluation, and the importance of remaining in the ED until their evaluation is complete.    Kelli Reichmann, PA-C 09/20/21 1913

## 2021-09-21 DIAGNOSIS — E1065 Type 1 diabetes mellitus with hyperglycemia: Secondary | ICD-10-CM | POA: Diagnosis present

## 2021-09-21 DIAGNOSIS — Z91199 Patient's noncompliance with other medical treatment and regimen due to unspecified reason: Secondary | ICD-10-CM | POA: Diagnosis not present

## 2021-09-21 DIAGNOSIS — Z765 Malingerer [conscious simulation]: Secondary | ICD-10-CM

## 2021-09-21 DIAGNOSIS — I10 Essential (primary) hypertension: Secondary | ICD-10-CM

## 2021-09-21 DIAGNOSIS — K3184 Gastroparesis: Secondary | ICD-10-CM

## 2021-09-21 DIAGNOSIS — E1159 Type 2 diabetes mellitus with other circulatory complications: Secondary | ICD-10-CM | POA: Diagnosis not present

## 2021-09-21 DIAGNOSIS — E1143 Type 2 diabetes mellitus with diabetic autonomic (poly)neuropathy: Secondary | ICD-10-CM

## 2021-09-21 DIAGNOSIS — Z833 Family history of diabetes mellitus: Secondary | ICD-10-CM | POA: Diagnosis not present

## 2021-09-21 DIAGNOSIS — I152 Hypertension secondary to endocrine disorders: Secondary | ICD-10-CM | POA: Diagnosis not present

## 2021-09-21 DIAGNOSIS — Z794 Long term (current) use of insulin: Secondary | ICD-10-CM | POA: Diagnosis not present

## 2021-09-21 DIAGNOSIS — E1042 Type 1 diabetes mellitus with diabetic polyneuropathy: Secondary | ICD-10-CM | POA: Diagnosis not present

## 2021-09-21 DIAGNOSIS — Z79899 Other long term (current) drug therapy: Secondary | ICD-10-CM | POA: Diagnosis not present

## 2021-09-21 DIAGNOSIS — E0590 Thyrotoxicosis, unspecified without thyrotoxic crisis or storm: Secondary | ICD-10-CM | POA: Diagnosis present

## 2021-09-21 DIAGNOSIS — E1069 Type 1 diabetes mellitus with other specified complication: Secondary | ICD-10-CM | POA: Diagnosis present

## 2021-09-21 DIAGNOSIS — N179 Acute kidney failure, unspecified: Secondary | ICD-10-CM | POA: Diagnosis present

## 2021-09-21 DIAGNOSIS — Z8711 Personal history of peptic ulcer disease: Secondary | ICD-10-CM | POA: Diagnosis not present

## 2021-09-21 DIAGNOSIS — K219 Gastro-esophageal reflux disease without esophagitis: Secondary | ICD-10-CM | POA: Diagnosis not present

## 2021-09-21 DIAGNOSIS — E1043 Type 1 diabetes mellitus with diabetic autonomic (poly)neuropathy: Secondary | ICD-10-CM | POA: Diagnosis present

## 2021-09-21 DIAGNOSIS — Z89422 Acquired absence of other left toe(s): Secondary | ICD-10-CM | POA: Diagnosis not present

## 2021-09-21 DIAGNOSIS — R112 Nausea with vomiting, unspecified: Secondary | ICD-10-CM | POA: Diagnosis not present

## 2021-09-21 DIAGNOSIS — Z8619 Personal history of other infectious and parasitic diseases: Secondary | ICD-10-CM | POA: Diagnosis not present

## 2021-09-21 LAB — CBG MONITORING, ED: Glucose-Capillary: 324 mg/dL — ABNORMAL HIGH (ref 70–99)

## 2021-09-21 LAB — URINALYSIS, ROUTINE W REFLEX MICROSCOPIC
Bacteria, UA: NONE SEEN
Bilirubin Urine: NEGATIVE
Glucose, UA: >=500 mg/dL — AB
Ketones, ur: 80 mg/dL — AB
Leukocytes,Ua: NEGATIVE
Nitrite: NEGATIVE
Protein, ur: >=300 mg/dL — AB
Specific Gravity, Urine: 1.026 (ref 1.005–1.030)
pH: 5 (ref 5.0–8.0)

## 2021-09-21 LAB — GLUCOSE, CAPILLARY
Glucose-Capillary: 369 mg/dL — ABNORMAL HIGH (ref 70–99)
Glucose-Capillary: 346 mg/dL — ABNORMAL HIGH (ref 70–99)
Glucose-Capillary: 229 mg/dL — ABNORMAL HIGH (ref 70–99)
Glucose-Capillary: 262 mg/dL — ABNORMAL HIGH (ref 70–99)
Glucose-Capillary: 285 mg/dL — ABNORMAL HIGH (ref 70–99)

## 2021-09-21 LAB — HEMOGLOBIN A1C
Hgb A1c MFr Bld: 10.9 % — ABNORMAL HIGH (ref 4.8–5.6)
Mean Plasma Glucose: 266.13 mg/dL

## 2021-09-21 MED ORDER — PANTOPRAZOLE SODIUM 40 MG IV SOLR
40.0000 mg | Freq: Once | INTRAVENOUS | Status: AC
  Administered 2021-09-21: 40 mg via INTRAVENOUS
  Filled 2021-09-21: qty 10

## 2021-09-21 MED ORDER — ACETAMINOPHEN 650 MG RE SUPP: 650.0000 mg | RECTAL | Status: DC | PRN

## 2021-09-21 MED ORDER — METOCLOPRAMIDE HCL 5 MG/ML IJ SOLN
10.0000 mg | Freq: Four times a day (QID) | INTRAMUSCULAR | Status: DC
  Administered 2021-09-21 – 2021-09-25 (×17): 10 mg via INTRAVENOUS
  Filled 2021-09-21 (×17): qty 2

## 2021-09-21 MED ORDER — METOPROLOL TARTRATE 5 MG/5ML IV SOLN
5.0000 mg | Freq: Once | INTRAVENOUS | Status: AC
  Administered 2021-09-21: 5 mg via INTRAVENOUS
  Filled 2021-09-21: qty 5

## 2021-09-21 MED ORDER — PROCHLORPERAZINE EDISYLATE 10 MG/2ML IJ SOLN
10.0000 mg | Freq: Once | INTRAMUSCULAR | Status: AC
Start: 2021-09-21 — End: 2021-09-21
  Administered 2021-09-21: 10 mg via INTRAVENOUS
  Filled 2021-09-21: qty 2

## 2021-09-21 MED ORDER — METOCLOPRAMIDE HCL 5 MG/ML IJ SOLN
10.0000 mg | Freq: Once | INTRAMUSCULAR | Status: AC
  Administered 2021-09-21: 10 mg via INTRAVENOUS
  Filled 2021-09-21: qty 2

## 2021-09-21 MED ORDER — HEPARIN SODIUM (PORCINE) 5000 UNIT/ML IJ SOLN
5000.0000 [IU] | Freq: Three times a day (TID) | INTRAMUSCULAR | Status: DC
  Administered 2021-09-21 – 2021-09-25 (×13): 5000 [IU] via SUBCUTANEOUS
  Filled 2021-09-21 (×13): qty 1

## 2021-09-21 MED ORDER — LACTATED RINGERS IV BOLUS
1000.0000 mL | Freq: Once | INTRAVENOUS | Status: AC
  Administered 2021-09-21: 1000 mL via INTRAVENOUS

## 2021-09-21 MED ORDER — INSULIN GLARGINE-YFGN 100 UNIT/ML ~~LOC~~ SOLN
15.0000 [IU] | Freq: Every day | SUBCUTANEOUS | Status: DC
  Administered 2021-09-21 – 2021-09-25 (×5): 15 [IU] via SUBCUTANEOUS
  Filled 2021-09-21 (×5): qty 0.15

## 2021-09-21 MED ORDER — PROCHLORPERAZINE EDISYLATE 10 MG/2ML IJ SOLN
10.0000 mg | Freq: Four times a day (QID) | INTRAMUSCULAR | Status: DC | PRN
  Administered 2021-09-21 – 2021-09-22 (×3): 10 mg via INTRAVENOUS
  Filled 2021-09-21 (×3): qty 2

## 2021-09-21 MED ORDER — METOPROLOL TARTRATE 5 MG/5ML IV SOLN
5.0000 mg | Freq: Four times a day (QID) | INTRAVENOUS | Status: DC
  Filled 2021-09-21: qty 5

## 2021-09-21 MED ORDER — KETOROLAC TROMETHAMINE 15 MG/ML IJ SOLN
15.0000 mg | Freq: Once | INTRAMUSCULAR | Status: AC
Start: 2021-09-21 — End: 2021-09-21
  Administered 2021-09-21: 15 mg via INTRAVENOUS
  Filled 2021-09-21: qty 1

## 2021-09-21 MED ORDER — HYDROMORPHONE HCL 1 MG/ML IJ SOLN
1.0000 mg | Freq: Once | INTRAMUSCULAR | Status: AC
  Administered 2021-09-21: 1 mg via INTRAVENOUS
  Filled 2021-09-21: qty 1

## 2021-09-21 MED ORDER — INSULIN ASPART 100 UNIT/ML IJ SOLN
0.0000 [IU] | INTRAMUSCULAR | Status: DC
  Administered 2021-09-21 (×2): 5 [IU] via SUBCUTANEOUS
  Administered 2021-09-21: 3 [IU] via SUBCUTANEOUS
  Administered 2021-09-21: 7 [IU] via SUBCUTANEOUS
  Administered 2021-09-21: 9 [IU] via SUBCUTANEOUS
  Administered 2021-09-22: 2 [IU] via SUBCUTANEOUS
  Administered 2021-09-22: 3 [IU] via SUBCUTANEOUS
  Administered 2021-09-22 (×2): 2 [IU] via SUBCUTANEOUS
  Administered 2021-09-22: 1 [IU] via SUBCUTANEOUS
  Administered 2021-09-23: 2 [IU] via SUBCUTANEOUS
  Administered 2021-09-23: 1 [IU] via SUBCUTANEOUS
  Administered 2021-09-23 – 2021-09-24 (×4): 2 [IU] via SUBCUTANEOUS
  Administered 2021-09-24: 1 [IU] via SUBCUTANEOUS
  Administered 2021-09-24: 2 [IU] via SUBCUTANEOUS
  Administered 2021-09-24 – 2021-09-25 (×3): 1 [IU] via SUBCUTANEOUS

## 2021-09-21 MED ORDER — MORPHINE SULFATE (PF) 4 MG/ML IV SOLN
4.0000 mg | Freq: Once | INTRAVENOUS | Status: AC
  Administered 2021-09-21: 4 mg via INTRAVENOUS
  Filled 2021-09-21: qty 1

## 2021-09-21 MED ORDER — AMLODIPINE BESYLATE 5 MG PO TABS
5.0000 mg | ORAL_TABLET | Freq: Every day | ORAL | Status: DC
  Administered 2021-09-21 – 2021-09-25 (×5): 5 mg via ORAL
  Filled 2021-09-21 (×5): qty 1

## 2021-09-21 MED ORDER — SODIUM CHLORIDE 0.9 % IV SOLN
250.0000 mg | Freq: Four times a day (QID) | INTRAVENOUS | Status: DC
  Administered 2021-09-21 – 2021-09-25 (×17): 250 mg via INTRAVENOUS
  Filled 2021-09-21 (×29): qty 5

## 2021-09-21 MED ORDER — MAGNESIUM SULFATE 2 GM/50ML IV SOLN
2.0000 g | Freq: Once | INTRAVENOUS | Status: AC
  Administered 2021-09-21: 2 g via INTRAVENOUS
  Filled 2021-09-21: qty 50

## 2021-09-21 MED ORDER — HYDROMORPHONE HCL 1 MG/ML IJ SOLN
1.0000 mg | INTRAMUSCULAR | Status: DC | PRN
  Administered 2021-09-21 – 2021-09-25 (×21): 1 mg via INTRAVENOUS
  Filled 2021-09-21 (×21): qty 1

## 2021-09-21 MED ORDER — HYDRALAZINE HCL 25 MG PO TABS: 25.0000 mg | ORAL_TABLET | Freq: Four times a day (QID) | ORAL | Status: DC | PRN

## 2021-09-21 MED ORDER — KETOROLAC TROMETHAMINE 15 MG/ML IJ SOLN
15.0000 mg | Freq: Once | INTRAMUSCULAR | Status: AC
  Administered 2021-09-21: 15 mg via INTRAVENOUS
  Filled 2021-09-21: qty 1

## 2021-09-21 MED ORDER — PANTOPRAZOLE SODIUM 40 MG IV SOLR
40.0000 mg | Freq: Two times a day (BID) | INTRAVENOUS | Status: DC
  Administered 2021-09-21 – 2021-09-23 (×5): 40 mg via INTRAVENOUS
  Filled 2021-09-21 (×5): qty 10

## 2021-09-21 MED ORDER — METOPROLOL TARTRATE 5 MG/5ML IV SOLN
2.5000 mg | Freq: Four times a day (QID) | INTRAVENOUS | Status: DC
  Administered 2021-09-21 – 2021-09-25 (×15): 2.5 mg via INTRAVENOUS
  Filled 2021-09-21 (×15): qty 5

## 2021-09-21 NOTE — Subjective & Objective (Signed)
CC: N/V, abd pain HPI: 31 year old African-American female with a 15-year history of type 1 diabetes, poorly controlled with her last A1c last month of 12.2 with recurrent admissions for diabetic gastroparesis, history of hypertension, reflux presents to the ER today again with abdominal pain.  This makes her 10th admission in 2023 for diabetic gastroparesis and abdominal pain.  Not mentioning the multiple ER visits where she was not admitted.  Patient continues to be noncompliant with getting her diabetes under control.  She is no longer on insulin pump.  She follows with [facility] endocrinology.  Her last visit was March 2023.  When she was seen in clinic in March, she was still on the pump.  Patient states that due to insurance issues she is no longer on the pump but doing subcutaneous Humulin R.  Patient received multiple boluses of IV fluids.  Patient specifically asking for IV Dilaudid in the ER.  This calls into question drug-seeking behavior.  Due to continued nausea, vomiting, [facility] hospitalist contacted for admission.  Patient states that she took a dose of Levemir this morning.

## 2021-09-21 NOTE — Plan of Care (Signed)
  Problem: Education: Goal: Knowledge of General Education information will improve Description Including pain rating scale, medication(s)/side effects and non-pharmacologic comfort measures Outcome: Progressing   Problem: Coping: Goal: Level of anxiety will decrease Outcome: Progressing   Problem: Pain Managment: Goal: General experience of comfort will improve Outcome: Progressing   Problem: Safety: Goal: Ability to remain free from injury will improve Outcome: Progressing   Problem: Skin Integrity: Goal: Risk for impaired skin integrity will decrease Outcome: Progressing   

## 2021-09-21 NOTE — Assessment & Plan Note (Signed)
IV lopressor due to NPO status.

## 2021-09-21 NOTE — Assessment & Plan Note (Signed)
Chronic. Due to uncontrolled DM and non-compliance.

## 2021-09-21 NOTE — H&P (Signed)
History and Physical    Kelli Hudson OIB:[id number] DOB: 03/04/1990 DOA: 09/20/2021  DOS: the patient was seen and examined on 09/20/2021  PCP: [name], MD   Patient coming from: Home  I have personally briefly reviewed patient's old medical records in [place]  CC: N/V, abd pain HPI: 31 year old African-American female with a 15-year history of type 1 diabetes, poorly controlled with her last A1c last month of 12.2 with recurrent admissions for diabetic gastroparesis, history of hypertension, reflux presents to the ER today again with abdominal pain.  This makes her 10th admission in 2023 for diabetic gastroparesis and abdominal pain.  Not mentioning the multiple ER visits where she was not admitted.  Patient continues to be noncompliant with getting her diabetes under control.  She is no longer on insulin pump.  She follows with [facility] endocrinology.  Her last visit was March 2023.  When she was seen in clinic in March, she was still on the pump.  Patient states that due to insurance issues she is no longer on the pump but doing subcutaneous Humulin R.  Patient received multiple boluses of IV fluids.  Patient specifically asking for IV Dilaudid in the ER.  This calls into question drug-seeking behavior.  Due to continued nausea, vomiting, [facility] contacted for admission.  Patient states that she took a dose of Levemir this morning.   ED Course: Labs do not show DKA.  Serum glucose of 430.  Review of Systems:  Review of Systems  Constitutional: Negative.   HENT: Negative.    Eyes: Negative.   Respiratory: Negative.    Cardiovascular: Negative.   Gastrointestinal:  Positive for abdominal pain, nausea and vomiting.  Genitourinary: Negative.   Musculoskeletal: Negative.   Skin: Negative.   Neurological: Negative.   Endo/Heme/Allergies:        States her blood sugars running 200-300s.  Psychiatric/Behavioral: Negative.    All other systems  reviewed and are negative.   Past Medical History:  Diagnosis Date   Acute H. pylori gastric ulcer    Diabetes mellitus ([place])    Diabetic gastroparesis ([place])    Gastroparesis    GERD (gastroesophageal reflux disease)    Hypertension    Hyperthyroidism     Past Surgical History:  Procedure Laterality Date   AMPUTATION TOE Left 03/10/2018   Procedure: AMPUTATION FIFTH TOE;  Surgeon: Trula Slade, DPM;  Location: [facility];  Service: Podiatry;  Laterality: Left;   BIOPSY  01/28/2020   Procedure: BIOPSY;  Surgeon: Otis Brace, MD;  Location: [facility];  Service: Gastroenterology;;   BOTOX INJECTION  08/26/2021   Procedure: BOTOX INJECTION;  Surgeon: Doran Stabler, MD;  Location: [facility];  Service: Gastroenterology;;   ESOPHAGOGASTRODUODENOSCOPY N/A 01/28/2020   Procedure: ESOPHAGOGASTRODUODENOSCOPY (EGD);  Surgeon: Otis Brace, MD;  Location: [facility];  Service: Gastroenterology;  Laterality: N/A;   ESOPHAGOGASTRODUODENOSCOPY (EGD) WITH PROPOFOL Left 09/08/2015   Procedure: ESOPHAGOGASTRODUODENOSCOPY (EGD) WITH PROPOFOL;  Surgeon: Arta Silence, MD;  Location: [facility] ENDOSCOPY;  Service: Endoscopy;  Laterality: Left;   ESOPHAGOGASTRODUODENOSCOPY (EGD) WITH PROPOFOL N/A 08/26/2021   Procedure: ESOPHAGOGASTRODUODENOSCOPY (EGD) WITH PROPOFOL;  Surgeon: Doran Stabler, MD;  [facility];  Service: Gastroenterology;  Laterality: N/A;   WISDOM TOOTH EXTRACTION       reports that she has never smoked. She has never used smokeless tobacco. She reports current alcohol use of about 1.0 standard drink of alcohol per week. She reports that she does not use  drugs.  No Known Allergies  Family History  Problem Relation Age of Onset   Lung cancer Mother    Diabetes Mother    Bipolar disorder Father    Liver cancer Maternal Grandfather    Diabetes Maternal Aunt        x 2   Pancreatic cancer Maternal Uncle    Prostate cancer Maternal  Uncle    Breast cancer Other        maternal great aunt   Heart disease Other    Ovarian cancer Other        maternal great aunt   Kidney disease Other        maternal great aunt   Colon cancer Neg Hx    Stomach cancer Neg Hx     Prior to Admission medications   Medication Sig Start Date End Date Taking? Authorizing Provider  albuterol (VENTOLIN HFA) 108 (90 Base) MCG/ACT inhaler Inhale 2 puffs into the lungs every 6 (six) hours as needed for shortness of breath.   Yes [provider]  atorvastatin (LIPITOR) 20 MG tablet Take 1 tablet (20 mg total) by mouth daily. 04/10/21  Yes [name], MD  carvedilol (COREG) 6.25 MG tablet Take 1 tablet (6.25 mg total) by mouth 2 (two) times daily with a meal. 07/01/21  Yes Nita Sells, MD  hydrOXYzine (VISTARIL) 25 MG capsule Take 1 capsule (25 mg total) by mouth every 8 (eight) hours as needed. Patient taking differently: Take 25 mg by mouth every 8 (eight) hours as needed for anxiety. 07/31/21  Yes Georgette Shell, MD  insulin detemir (LEVEMIR FLEXTOUCH) 100 UNIT/ML FlexPen Inject 16 Units into the skin daily. Patient taking differently: Inject 18 Units into the skin daily. 06/14/21  Yes Shelly Coss, MD  ondansetron (ZOFRAN) 4 MG tablet Take 1 tablet (4 mg total) by mouth every 8 (eight) hours as needed for nausea or vomiting. 08/27/21 08/27/22 Yes Gherghe, Vella Redhead, MD  pantoprazole (PROTONIX) 40 MG tablet Take 1 tablet (40 mg total) by mouth 2 (two) times daily before a meal. 08/19/21  Yes Danford, Suann Larry, MD  prochlorperazine (COMPAZINE) 10 MG tablet Take 1/2-1 tablet (5-10 mg total) by mouth every 8 (eight) hours as needed for nausea or vomiting. 08/27/21  Yes Gherghe, Vella Redhead, MD  blood glucose meter kit and supplies Dispense based on patient and insurance preference. Use up to four times daily as directed. (FOR ICD-10 E10.9, E11.9). 06/14/21   Shelly Coss, MD  blood glucose meter kit and supplies Dispense based  on patient and insurance preference. Use up to four times daily as directed. (FOR ICD-10 E10.9, E11.9). 07/31/21   Georgette Shell, MD  Continuous Blood Gluc Sensor (DEXCOM G6 SENSOR) MISC 1 each by Does not apply route daily. 11/21/20   [name], MD  Continuous Blood Gluc Transmit (DEXCOM G6 TRANSMITTER) MISC USE AS DIRECTED 09/16/21   [name], MD  dicyclomine (BENTYL) 20 MG tablet Take 1 tablet (20 mg total) by mouth 3 (three) times daily as needed for spasms. 07/31/21   Georgette Shell, MD  glucose blood (TRUE METRIX BLOOD GLUCOSE TEST) test strip Use as instructed Patient taking differently: 1 each by Other route See admin instructions. Use as instructed 09/03/20   [name], MD  Insulin Pen Needle (PEN NEEDLES 3/16") 31G X 5 MM MISC 1 each by Does not apply route in the morning, at noon, and at bedtime. 06/14/21   Shelly Coss, MD  insulin regular (NOVOLIN R) 100 units/mL injection Inject 0-0.15 mLs (0-15 Units total) into the skin See admin instructions. Via pump.Start this medication after you follow up with your PCP Patient taking differently: Inject 0-15 Units into the skin See admin instructions. Via pump. 06/14/21   Shelly Coss, MD  TRUEPLUS LANCETS 28G MISC assist with checking blood sugar TID and qhs Patient taking differently: 1 each by Other route See admin instructions. assist with checking blood sugar TID and qhs 09/10/15   Zettie Pho S, PA-C  insulin aspart (NOVOLOG) 100 UNIT/ML FlexPen Inject 5 Units into the skin 3 (three) times daily with meals. 02/23/18 07/05/19  Donne Hazel, MD    Physical Exam: Vitals:   09/21/21 0130 09/21/21 0200 09/21/21 0252 09/21/21 0300  BP: (!) 191/117 (!) 178/119  (!) 173/110  Pulse: (!) 127 (!) 132  (!) 129  Resp: 14 14  15   Temp:   98 F (36.7 C)   TempSrc:      SpO2: 96% 100%  99%  Weight:      Height:        Physical Exam Vitals and nursing note reviewed.  Constitutional:      General: She is not in  acute distress.    Appearance: She is not ill-appearing, toxic-appearing or diaphoretic.  HENT:     Head: Normocephalic and atraumatic.     Nose: Nose normal.  Eyes:     General: No scleral icterus. Cardiovascular:     Rate and Rhythm: Regular rhythm. Tachycardia present.     Pulses: Normal pulses.  Pulmonary:     Effort: Pulmonary effort is normal. No respiratory distress.     Breath sounds: No wheezing or rales.  Abdominal:     General: Abdomen is flat. Bowel sounds are normal.     Palpations: Abdomen is soft.     Tenderness: There is generalized abdominal tenderness. There is no guarding or rebound.  Musculoskeletal:     Right lower leg: No edema.     Left lower leg: No edema.  Skin:    General: Skin is warm and dry.     Capillary Refill: Capillary refill takes less than 2 seconds.  Neurological:     General: No focal deficit present.     Mental Status: She is alert and oriented to person, place, and time.      Labs on Admission: I have personally reviewed following labs and imaging studies  CBC: Recent Labs  Lab 09/20/21 1942  WBC 7.0  NEUTROABS 5.1  HGB 12.8  HCT 39.1  MCV 87.7  PLT 924*   Basic Metabolic Panel: Recent Labs  Lab 09/20/21 1942  NA 136  K 4.5  CL 101  CO2 21*  GLUCOSE 430*  BUN 27*  CREATININE 1.06*  CALCIUM 9.9  MG 1.9   GFR: Estimated Creatinine Clearance: 87 mL/min (A) (by C-G formula based on SCr of 1.06 mg/dL (H)). Liver Function Tests: Recent Labs  Lab 09/20/21 1942  AST 22  ALT 19  ALKPHOS 92  BILITOT 1.1  PROT 8.2*  ALBUMIN 3.6   Recent Labs  Lab 09/20/21 1942  LIPASE 23   No results for input(s): "AMMONIA" in the last 168 hours. Coagulation Profile: No results for input(s): "INR", "PROTIME" in the last 168 hours. Cardiac Enzymes: No results for input(s): "CKTOTAL", "CKMB", "CKMBINDEX", "TROPONINI", "TROPONINIHS" in the last 168 hours. BNP (last 3 results) No results for input(s): "PROBNP" in the last 8760  hours. HbA1C: No results  for input(s): "HGBA1C" in the last 72 hours. CBG: Recent Labs  Lab 09/21/21 0149  GLUCAP 324*   Lipid Profile: No results for input(s): "CHOL", "HDL", "LDLCALC", "TRIG", "CHOLHDL", "LDLDIRECT" in the last 72 hours. Thyroid Function Tests: No results for input(s): "TSH", "T4TOTAL", "FREET4", "T3FREE", "THYROIDAB" in the last 72 hours. Anemia Panel: No results for input(s): "VITAMINB12", "FOLATE", "FERRITIN", "TIBC", "IRON", "RETICCTPCT" in the last 72 hours. Urine analysis:    Component Value Date/Time   COLORURINE YELLOW 09/20/2021 2253   APPEARANCEUR CLEAR 09/20/2021 2253   LABSPEC 1.026 09/20/2021 2253   PHURINE 5.0 09/20/2021 2253   GLUCOSEU >=500 (A) 09/20/2021 2253   GLUCOSEU NEGATIVE 05/22/2020 1417   HGBUR SMALL (A) 09/20/2021 2253   BILIRUBINUR NEGATIVE 09/20/2021 2253   KETONESUR 80 (A) 09/20/2021 2253   PROTEINUR >=300 (A) 09/20/2021 2253   UROBILINOGEN 0.2 05/22/2020 1417   NITRITE NEGATIVE 09/20/2021 2253   LEUKOCYTESUR NEGATIVE 09/20/2021 2253    Radiological Exams on Admission: I have personally reviewed images No results found.  EKG: My personal interpretation of EKG shows: sinus tachycardia    Assessment/Plan Principal Problem:   Diabetic gastroparesis (HCC) Active Problems:   Hypertension associated with diabetes ([place])   Type 1 diabetes mellitus with diabetic polyneuropathy (HCC)   Intractable nausea and vomiting   Essential hypertension   GERD without esophagitis   Drug-seeking behavior    Assessment and Plan: * Diabetic gastroparesis ([place]) Observation telemetry bed. Continue with IVF. Start scheduled IV reglan and IV erythromycin. She should get one or the other every 3 hours.  AVOID OPIATES for her abd pain. Keep NPO except ice chips.  GERD without esophagitis IV protonix 40 mg bid.  Essential hypertension IV lopressor 5 mg q6h.  Intractable nausea and vomiting Chronic. Due to uncontrolled DM and  non-compliance.  Type 1 diabetes mellitus with diabetic polyneuropathy (HCC) Start lantus and SSI q4h to prevent DKA.  Hypertension associated with diabetes ([place]) IV lopressor due to NPO status.  Drug-seeking behavior Pt asking IV dilaudid by name. Claming that that is the only medication that works for her diabetic gastroparesis pain. This is her 10th admission for 2023 for abd pain and gastroparesis. Do not give opiates.   DVT prophylaxis: SQ Heparin Code Status: Full Code Family Communication: no family at bedside  Disposition Plan: return home  Consults called: none  Admission status: Observation, Telemetry bed   Kristopher Oppenheim, DO [facility] 09/21/2021, 3:59 AM

## 2021-09-21 NOTE — Progress Notes (Signed)
Subjective: Patient admitted this morning, see detailed H&P by Dr Imogene Burn 31 year old female with 15-year history of diabetes mellitus type 1, poorly controlled, last A1c from last month was 12.2 with recurrent admissions for diabetic gastroparesis, history of hypertension, GERD came to ER with abdominal pain.  This was patient's 10th admission in 2023 for diabetic gastroparesis and abdominal pain.  Patient is no longer on insulin pump.  She is followed by [facility] endocrinology.  She was seen there in March and was seen on pump.  Patient says every 2 insurance issues she is no longer on the pump and is doing subcutaneous Humulin R.  Vitals:   09/21/21 0902 09/21/21 1249  BP: (!) 161/108 (!) 142/89  Pulse: (!) 121 (!) 106  Resp: 16   Temp: 98.2 F (36.8 C)   SpO2: 100%       A/P Diabetic gastroparesis -Patient on scheduled IV Reglan and IV erythromycin -Avoid opioids -We will start patient on clear liquid diet -Start Dilaudid 1 mg IV every 4 hours as needed for pain  GERD without esophagitis -Continue Protonix 40 mg IV twice daily  Hypertension -Continue Lopressor 5 mg IV every 6 hours  Diabetes mellitus type 1 with diabetic polyneuropathy -Started on Lantus and sliding scale insulin every 4 hours    Kelli Hudson S Rayne Loiseau [facility]

## 2021-09-21 NOTE — Assessment & Plan Note (Signed)
IV lopressor 5 mg q6h.

## 2021-09-21 NOTE — ED Notes (Signed)
ED TO INPATIENT HANDOFF REPORT  ED Nurse Name and Phone #: [phone] Clovis Cao Name/Age/Gender Kelli Hudson 31 y.o. female Room/Bed: WA16/WA16  Code Status   Code Status: Full Code  Home/SNF/Other Home Patient oriented to: self, place, time, situation Is this baseline? Yes   Triage Complete: Triage complete  Chief Complaint Diabetic gastroparesis (HCC) [E11.43, K31.84]  Triage Note Generalized abd pain/n/v x2 days  Hx gastroparesis being admitted 3 weeks ago for the same.    Allergies No Known Allergies  Level of Care/Admitting Diagnosis ED Disposition     ED Disposition  Admit   Condition  --   Comment  Hospital Area: [facility] [[id number]]  Level of Care: Telemetry [5]  Admit to tele based on following criteria: Complex arrhythmia (Bradycardia/Tachycardia)  May place patient in observation at [facility] or [facility] if equivalent level of care is available:: No  Covid Evaluation: Asymptomatic - no recent exposure (last 10 days) testing not required  Diagnosis: Diabetic gastroparesis [facility]) [[id number]]  Admitting Physician: Imogene Burn, ERIC [[id number]]  Attending Physician: Imogene Burn, ERIC [[id number]]          B Medical/Surgery History Past Medical History:  Diagnosis Date   Acute H. pylori gastric ulcer    Diabetes mellitus (HCC)    Diabetic gastroparesis (HCC)    Gastroparesis    GERD (gastroesophageal reflux disease)    Hypertension    Hyperthyroidism    Past Surgical History:  Procedure Laterality Date   AMPUTATION TOE Left 03/10/2018   Procedure: AMPUTATION FIFTH TOE;  Surgeon: Vivi Barrack, DPM;  Location: [facility];  Service: Podiatry;  Laterality: Left;   BIOPSY  01/28/2020   Procedure: BIOPSY;  Surgeon: Kathi Der, MD;  Location: [facility];  Service: Gastroenterology;;   BOTOX INJECTION  08/26/2021   Procedure: BOTOX INJECTION;  Surgeon: Sherrilyn Rist, MD;  Location: [facility];  Service:  Gastroenterology;;   ESOPHAGOGASTRODUODENOSCOPY N/A 01/28/2020   Procedure: ESOPHAGOGASTRODUODENOSCOPY (EGD);  Surgeon: Kathi Der, MD;  Location: [facility];  Service: Gastroenterology;  Laterality: N/A;   ESOPHAGOGASTRODUODENOSCOPY (EGD) WITH PROPOFOL Left 09/08/2015   Procedure: ESOPHAGOGASTRODUODENOSCOPY (EGD) WITH PROPOFOL;  Surgeon: Willis Modena, MD;  Location: [facility] ENDOSCOPY;  Service: Endoscopy;  Laterality: Left;   ESOPHAGOGASTRODUODENOSCOPY (EGD) WITH PROPOFOL N/A 08/26/2021   Procedure: ESOPHAGOGASTRODUODENOSCOPY (EGD) WITH PROPOFOL;  Surgeon: Sherrilyn Rist, MD;  Location[facility];  Service: Gastroenterology;  Laterality: N/A;   WISDOM TOOTH EXTRACTION       A IV Location/Drains/Wounds Patient Lines/Drains/Airways Status     Active Line/Drains/Airways     Name Placement date Placement time Site Days   Peripheral IV 09/20/21 20 G Left Antecubital 09/20/21  2313  Antecubital  1   AIRWAYS 08/26/21  1128  -- 26   AIRWAYS 08/26/21  1128  -- 26            Intake/Output Last 24 hours No intake or output data in the 24 hours ending 09/21/21 0356  Labs/Imaging Results for orders placed or performed during the hospital encounter of 09/20/21 (from the past 48 hour(s))  CBC with Differential     Status: Abnormal   Collection Time: 09/20/21  7:42 PM  Result Value Ref Range   WBC 7.0 4.0 - 10.5 K/uL   RBC 4.46 3.87 - 5.11 MIL/uL   Hemoglobin 12.8 12.0 - 15.0 g/dL   HCT 13.2 44.0 - 10.2 %   MCV 87.7 80.0 - 100.0 fL   MCH 28.7 26.0 -  34.0 pg   MCHC 32.7 30.0 - 36.0 g/dL   RDW 38.4 53.6 - 46.8 %   Platelets 454 (H) 150 - 400 K/uL   nRBC 0.0 0.0 - 0.2 %   Neutrophils Relative % 72 %   Neutro Abs 5.1 1.7 - 7.7 K/uL   Lymphocytes Relative 20 %   Lymphs Abs 1.4 0.7 - 4.0 K/uL   Monocytes Relative 6 %   Monocytes Absolute 0.4 0.1 - 1.0 K/uL   Eosinophils Relative 1 %   Eosinophils Absolute 0.1 0.0 - 0.5 K/uL   Basophils Relative 1 %   Basophils Absolute 0.0  0.0 - 0.1 K/uL   Immature Granulocytes 0 %   Abs Immature Granulocytes 0.01 0.00 - 0.07 K/uL    Comment: Performed at [facility], [address], [place], [place] [address]  Comprehensive metabolic panel     Status: Abnormal   Collection Time: 09/20/21  7:42 PM  Result Value Ref Range   Sodium 136 135 - 145 mmol/L   Potassium 4.5 3.5 - 5.1 mmol/L   Chloride 101 98 - 111 mmol/L   CO2 21 (L) 22 - 32 mmol/L   Glucose, Bld 430 (H) 70 - 99 mg/dL    Comment: Glucose reference range applies only to samples taken after fasting for at least 8 hours.   BUN 27 (H) 6 - 20 mg/dL   Creatinine, Ser 2.48 (H) 0.44 - 1.00 mg/dL   Calcium 9.9 8.9 - 25.0 mg/dL   Total Protein 8.2 (H) 6.5 - 8.1 g/dL   Albumin 3.6 3.5 - 5.0 g/dL   AST 22 15 - 41 U/L   ALT 19 0 - 44 U/L   Alkaline Phosphatase 92 38 - 126 U/L   Total Bilirubin 1.1 0.3 - 1.2 mg/dL   GFR, Estimated >03 >70 mL/min    Comment: (NOTE) Calculated using the CKD-EPI Creatinine Equation (2021)    Anion gap 14 5 - 15    Comment: Performed at [facility], [address], [place], [place] [address]  Lipase, blood     Status: None   Collection Time: 09/20/21  7:42 PM  Result Value Ref Range   Lipase 23 11 - 51 U/L    Comment: Performed at [facility], [address], [place], [place] [address]  Magnesium     Status: None   Collection Time: 09/20/21  7:42 PM  Result Value Ref Range   Magnesium 1.9 1.7 - 2.4 mg/dL    Comment: Performed at [facility], [address], [place], [place] [address]  Urinalysis, Routine w reflex microscopic Urine, Clean Catch     Status: Abnormal   Collection Time: 09/20/21 10:53 PM  Result Value Ref Range   Color, Urine YELLOW YELLOW   APPearance CLEAR CLEAR   Specific Gravity, Urine 1.026 1.005 - 1.030   pH 5.0 5.0 - 8.0   Glucose, UA >=500 (A) NEGATIVE mg/dL   Hgb urine dipstick SMALL (A) NEGATIVE   Bilirubin Urine NEGATIVE  NEGATIVE   Ketones, ur 80 (A) NEGATIVE mg/dL   Protein, ur >=280 (A) NEGATIVE mg/dL   Nitrite NEGATIVE NEGATIVE   Leukocytes,Ua NEGATIVE NEGATIVE   RBC / HPF 0-5 0 - 5 RBC/hpf   WBC, UA 0-5 0 - 5 WBC/hpf   Bacteria, UA NONE SEEN NONE SEEN   Squamous Epithelial / LPF 0-5 0 - 5    Comment: Performed at [facility], [address] [place], [place] [address]  Blood gas, venous     Status: Abnormal   Collection Time: 09/20/21 11:36 PM  Result Value Ref Range   pH, Ven 7.38 7.25 - 7.43   pCO2, Ven 34 (L) 44 - 60 mmHg   pO2, Ven 53 (H) 32 - 45 mmHg   Bicarbonate 20.1 20.0 - 28.0 mmol/L   Acid-base deficit 4.2 (H) 0.0 - 2.0 mmol/L   O2 Saturation 81.7 %   Patient temperature 37.1     Comment: Performed at [facility], [address], [place], [place] [address]  CBG monitoring, ED     Status: Abnormal   Collection Time: 09/21/21  1:49 AM  Result Value Ref Range   Glucose-Capillary 324 (H) 70 - 99 mg/dL    Comment: Glucose reference range applies only to samples taken after fasting for at least 8 hours.   No results found.  Pending Labs Unresulted Labs (From admission, onward)    None       Vitals/Pain Today's Vitals   09/21/21 0130 09/21/21 0200 09/21/21 0252 09/21/21 0300  BP: (!) 191/117 (!) 178/119  (!) 173/110  Pulse: (!) 127 (!) 132  (!) 129  Resp: 14 14  15   Temp:   98 F (36.7 C)   TempSrc:      SpO2: 96% 100%  99%  Weight:      Height:      PainSc:        Isolation Precautions No active isolations  Medications Medications  lactated ringers bolus 1,000 mL (has no administration in time range)  prochlorperazine (COMPAZINE) injection 10 mg (has no administration in time range)  metoprolol tartrate (LOPRESSOR) injection 5 mg (has no administration in time range)  erythromycin 250 mg in sodium chloride 0.9 % 100 mL IVPB (has no administration in time range)  metoCLOPramide (REGLAN) injection 10 mg (has no administration  in time range)  pantoprazole (PROTONIX) injection 40 mg (has no administration in time range)  HYDROmorphone (DILAUDID) injection 1 mg (1 mg Intravenous Given 09/20/21 2313)  lactated ringers bolus 1,000 mL (1,000 mLs Intravenous Bolus 09/20/21 2313)  lactated ringers bolus 1,000 mL (1,000 mLs Intravenous Bolus 09/21/21 0123)  HYDROmorphone (DILAUDID) injection 1 mg (1 mg Intravenous Given 09/21/21 0123)  metoCLOPramide (REGLAN) injection 10 mg (10 mg Intravenous Given 09/21/21 0123)  morphine (PF) 4 MG/ML injection 4 mg (4 mg Intravenous Given 09/21/21 0200)  pantoprazole (PROTONIX) injection 40 mg (40 mg Intravenous Given 09/21/21 0200)    Mobility walks Low fall risk   Focused Assessments    R Recommendations: See Admitting Provider Note  Report given to:   Additional Notes:

## 2021-09-21 NOTE — ED Notes (Signed)
Checked blood sugar 394

## 2021-09-21 NOTE — Assessment & Plan Note (Addendum)
Pt asking IV dilaudid by name. Claming that that is the only medication that works for her diabetic gastroparesis pain. This is her 10th admission for 2023 for abd pain and gastroparesis. Do not give opiates.

## 2021-09-21 NOTE — Assessment & Plan Note (Signed)
Start lantus and SSI q4h to prevent DKA.

## 2021-09-21 NOTE — Assessment & Plan Note (Signed)
IV protonix 40 mg bid. 

## 2021-09-21 NOTE — Progress Notes (Signed)
   09/21/21 0442  Assess: MEWS Score  Temp 98.1 F (36.7 C)  BP (!) 96/45  MAP (mmHg) (!) 59  Pulse Rate (!) 108  ECG Heart Rate (!) 108  Resp 18  Level of Consciousness Alert  SpO2 100 %  O2 Device Room Air  Assess: MEWS Score  MEWS Temp 0  MEWS Systolic 1  MEWS Pulse 1  MEWS RR 0  MEWS LOC 0  MEWS Score 2  MEWS Score Color Yellow  Assess: if the MEWS score is Yellow or Red  Were vital signs taken at a resting state? Yes  Focused Assessment No change from prior assessment  Does the patient meet 2 or more of the SIRS criteria? Yes  Does the patient have a confirmed or suspected source of infection? No  MEWS guidelines implemented *See Row Information* Yes  Treat  MEWS Interventions Administered scheduled meds/treatments  Pain Scale 0-10  Pain Score 9  Pain Type Other (Comment)  Pain Location Abdomen  Pain Orientation Anterior  Pain Descriptors / Indicators Aching;Discomfort  Pain Frequency Constant  Pain Onset On-going  Patients Stated Pain Goal 6  Pain Intervention(s) Medication (See eMAR);MD notified (Comment);Repositioned  Take Vital Signs  Increase Vital Sign Frequency  Yellow: Q 2hr X 2 then Q 4hr X 2, if remains yellow, continue Q 4hrs  Escalate  MEWS: Escalate Yellow: discuss with charge nurse/RN and consider discussing with provider and RRT  Notify: Charge Nurse/RN  Name of Charge Nurse/RN Notified Rosary Lively., RN  Date Charge Nurse/RN Notified 09/21/21  Time Charge Nurse/RN Notified 0515  Notify: Provider  Provider Name/Title  (Admitting attending already aware of patient's condition)  Notify: Rapid Response  Name of Rapid Response RN Notified George Ina, RN (rapid response nurse) (notified due to SIRS criteria of 2, but appears EPIC had a glitch and miscalculated, since patient does not have an abnormal WBC)  Date Rapid Response Notified 09/21/21  Time Rapid Response Notified 0530  Document  Patient Outcome Stabilized after interventions  Progress note  created (see row info) Yes  Assess: SIRS CRITERIA  SIRS Temperature  0  SIRS Pulse 1  SIRS Respirations  0  SIRS WBC 1  SIRS Score Sum  2

## 2021-09-21 NOTE — Assessment & Plan Note (Addendum)
Observation telemetry bed. Continue with IVF. Start scheduled IV reglan and IV erythromycin. She should get one or the other every 3 hours.  AVOID OPIATES for her abd pain. Keep NPO except ice chips.

## 2021-09-21 NOTE — ED Notes (Signed)
Checked blood sugar 324 advised RN and the DR

## 2021-09-22 DIAGNOSIS — R112 Nausea with vomiting, unspecified: Secondary | ICD-10-CM | POA: Diagnosis not present

## 2021-09-22 DIAGNOSIS — E1143 Type 2 diabetes mellitus with diabetic autonomic (poly)neuropathy: Secondary | ICD-10-CM | POA: Diagnosis not present

## 2021-09-22 DIAGNOSIS — E1042 Type 1 diabetes mellitus with diabetic polyneuropathy: Secondary | ICD-10-CM | POA: Diagnosis not present

## 2021-09-22 DIAGNOSIS — K219 Gastro-esophageal reflux disease without esophagitis: Secondary | ICD-10-CM | POA: Diagnosis not present

## 2021-09-22 DIAGNOSIS — E1159 Type 2 diabetes mellitus with other circulatory complications: Secondary | ICD-10-CM | POA: Diagnosis not present

## 2021-09-22 DIAGNOSIS — I152 Hypertension secondary to endocrine disorders: Secondary | ICD-10-CM | POA: Diagnosis not present

## 2021-09-22 DIAGNOSIS — K3184 Gastroparesis: Secondary | ICD-10-CM | POA: Diagnosis not present

## 2021-09-22 DIAGNOSIS — I10 Essential (primary) hypertension: Secondary | ICD-10-CM | POA: Diagnosis not present

## 2021-09-22 LAB — CBC
HCT: 34.2 % — ABNORMAL LOW (ref 36.0–46.0)
Hemoglobin: 10.9 g/dL — ABNORMAL LOW (ref 12.0–15.0)
MCH: 29.1 pg (ref 26.0–34.0)
MCHC: 31.9 g/dL (ref 30.0–36.0)
MCV: 91.4 fL (ref 80.0–100.0)
Platelets: 334 10*3/uL (ref 150–400)
RBC: 3.74 MIL/uL — ABNORMAL LOW (ref 3.87–5.11)
RDW: 13.5 % (ref 11.5–15.5)
WBC: 13.4 10*3/uL — ABNORMAL HIGH (ref 4.0–10.5)
nRBC: 0 % (ref 0.0–0.2)

## 2021-09-22 LAB — COMPREHENSIVE METABOLIC PANEL
ALT: 14 U/L (ref 0–44)
AST: 16 U/L (ref 15–41)
Albumin: 2.8 g/dL — ABNORMAL LOW (ref 3.5–5.0)
Alkaline Phosphatase: 68 U/L (ref 38–126)
Anion gap: 5 (ref 5–15)
BUN: 29 mg/dL — ABNORMAL HIGH (ref 6–20)
CO2: 26 mmol/L (ref 22–32)
Calcium: 9.3 mg/dL (ref 8.9–10.3)
Chloride: 105 mmol/L (ref 98–111)
Creatinine, Ser: 1.6 mg/dL — ABNORMAL HIGH (ref 0.44–1.00)
GFR, Estimated: 44 mL/min — ABNORMAL LOW (ref >60)
Glucose, Bld: 164 mg/dL — ABNORMAL HIGH (ref 70–99)
Potassium: 4.3 mmol/L (ref 3.5–5.1)
Sodium: 136 mmol/L (ref 135–145)
Total Bilirubin: 0.7 mg/dL (ref 0.3–1.2)
Total Protein: 6.3 g/dL — ABNORMAL LOW (ref 6.5–8.1)

## 2021-09-22 LAB — GLUCOSE, CAPILLARY
Glucose-Capillary: 120 mg/dL — ABNORMAL HIGH (ref 70–99)
Glucose-Capillary: 146 mg/dL — ABNORMAL HIGH (ref 70–99)
Glucose-Capillary: 172 mg/dL — ABNORMAL HIGH (ref 70–99)
Glucose-Capillary: 176 mg/dL — ABNORMAL HIGH (ref 70–99)
Glucose-Capillary: 176 mg/dL — ABNORMAL HIGH (ref 70–99)
Glucose-Capillary: 225 mg/dL — ABNORMAL HIGH (ref 70–99)

## 2021-09-22 MED ORDER — LACTATED RINGERS IV SOLN: INTRAVENOUS | Status: AC

## 2021-09-22 NOTE — Plan of Care (Signed)

## 2021-09-22 NOTE — Progress Notes (Addendum)
[facility]  PROGRESS NOTE  Kelli Hudson [id number]:[id number] DOB: 06/09/1990 DOA: 09/20/2021 PCP: Loyola Mast, MD   Brief HPI:    31 year old female with 15-year history of diabetes mellitus type 1, poorly controlled, last A1c from last month was 12.2 with recurrent admissions for diabetic gastroparesis, history of hypertension, GERD came to ER with abdominal pain.  This was patient's 10th admission in 2023 for diabetic gastroparesis and abdominal pain.  Patient is no longer on insulin pump.  She is followed by [facility] endocrinology.  She was seen there in March and was seen on pump.  Patient says every 2 insurance issues she is no longer on the pump and is doing subcutaneous Humulin R.   Subjective   Patient seen and examined, nausea and vomiting has improved.  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         Medications     amLODipine  5 mg Oral Daily   heparin  5,000 Units Subcutaneous Q8H   insulin aspart  0-9 Units Subcutaneous Q4H   insulin glargine-yfgn  15 Units Subcutaneous Daily   metoCLOPramide (REGLAN) injection  10 mg Intravenous Q6H   metoprolol tartrate  2.5 mg Intravenous Q6H   pantoprazole (PROTONIX) IV  40 mg Intravenous Q12H     Data Reviewed:   CBG:  Recent Labs  Lab 09/21/21 2047 09/22/21 0022 09/22/21 0437 09/22/21 0803 09/22/21 1213  GLUCAP  285* 225* 176* 146* 172*    SpO2: 99 %    Vitals:   09/21/21 2050 09/22/21 0019 09/22/21 0457 09/22/21 0500  BP: (!) 95/57 112/73 129/88   Pulse: (!) 101 99 (!) 110   Resp: 20 18 18    Temp: 98.6 F (37 C) 98 F (36.7 C) 98.1 F (36.7 C)   TempSrc: Oral Oral Oral   SpO2: 99% 98% 99%   Weight:    82.7 kg  Height:          Data Reviewed:  Basic Metabolic Panel: Recent Labs  Lab 09/20/21 1942 09/22/21 0715  NA 136 136  K 4.5 4.3  CL 101 105  CO2 21* 26  GLUCOSE 430* 164*  BUN 27* 29*  CREATININE 1.06* 1.60*  CALCIUM 9.9 9.3  MG 1.9  --     CBC: Recent Labs  Lab 09/20/21 1942 09/22/21 0443  WBC 7.0 13.4*  NEUTROABS 5.1  --   HGB 12.8 10.9*  HCT 39.1 34.2*  MCV 87.7 91.4  PLT 454* 334    LFT Recent Labs  Lab 09/20/21 1942 09/22/21 0715  AST 22 16  ALT 19 14  ALKPHOS 92 68  BILITOT 1.1 0.7  PROT 8.2* 6.3*  ALBUMIN 3.6 2.8*     Antibiotics: Anti-infectives (From admission, onward)    Start     Dose/Rate Route Frequency Ordered Stop   09/21/21 0400  erythromycin 250 mg in sodium chloride 0.9 % 100 mL  IVPB        250 mg 100 mL/hr over 60 Minutes Intravenous Every 6 hours 09/21/21 0349          DVT prophylaxis: Heparin  Code Status: Full code  Family Communication:    CONSULTS    Objective    Physical Examination:  General-appears in no acute distress Heart-S1-S2, regular, no murmur auscultated Lungs-clear to auscultation bilaterally, no wheezing or crackles auscultated Abdomen-soft, nontender, no organomegaly Extremities-no edema in the lower extremities Neuro-alert, oriented x3, no focal deficit noted  Status is: Inpatient:, Is           [name] S Katura Eatherly   [facility] If 7PM-7AM, please contact night-coverage at [email or web address], Office  [phone]   09/22/2021, 2:00 PM  LOS: 1 day

## 2021-09-23 DIAGNOSIS — K3184 Gastroparesis: Secondary | ICD-10-CM | POA: Diagnosis not present

## 2021-09-23 DIAGNOSIS — E1159 Type 2 diabetes mellitus with other circulatory complications: Secondary | ICD-10-CM

## 2021-09-23 DIAGNOSIS — E1143 Type 2 diabetes mellitus with diabetic autonomic (poly)neuropathy: Secondary | ICD-10-CM | POA: Diagnosis not present

## 2021-09-23 DIAGNOSIS — R112 Nausea with vomiting, unspecified: Secondary | ICD-10-CM | POA: Diagnosis not present

## 2021-09-23 DIAGNOSIS — I152 Hypertension secondary to endocrine disorders: Secondary | ICD-10-CM | POA: Diagnosis not present

## 2021-09-23 DIAGNOSIS — E1042 Type 1 diabetes mellitus with diabetic polyneuropathy: Secondary | ICD-10-CM | POA: Diagnosis not present

## 2021-09-23 DIAGNOSIS — I10 Essential (primary) hypertension: Secondary | ICD-10-CM | POA: Diagnosis not present

## 2021-09-23 DIAGNOSIS — K219 Gastro-esophageal reflux disease without esophagitis: Secondary | ICD-10-CM | POA: Diagnosis not present

## 2021-09-23 LAB — BASIC METABOLIC PANEL
Anion gap: 6 (ref 5–15)
BUN: 21 mg/dL — ABNORMAL HIGH (ref 6–20)
CO2: 25 mmol/L (ref 22–32)
Calcium: 9.4 mg/dL (ref 8.9–10.3)
Chloride: 105 mmol/L (ref 98–111)
Creatinine, Ser: 1.07 mg/dL — ABNORMAL HIGH (ref 0.44–1.00)
GFR, Estimated: >60 mL/min (ref >60)
Glucose, Bld: 105 mg/dL — ABNORMAL HIGH (ref 70–99)
Potassium: 4.6 mmol/L (ref 3.5–5.1)
Sodium: 136 mmol/L (ref 135–145)

## 2021-09-23 LAB — GLUCOSE, CAPILLARY
Glucose-Capillary: 111 mg/dL — ABNORMAL HIGH (ref 70–99)
Glucose-Capillary: 118 mg/dL — ABNORMAL HIGH (ref 70–99)
Glucose-Capillary: 134 mg/dL — ABNORMAL HIGH (ref 70–99)
Glucose-Capillary: 162 mg/dL — ABNORMAL HIGH (ref 70–99)
Glucose-Capillary: 163 mg/dL — ABNORMAL HIGH (ref 70–99)
Glucose-Capillary: 191 mg/dL — ABNORMAL HIGH (ref 70–99)

## 2021-09-23 MED ORDER — LACTATED RINGERS IV SOLN: INTRAVENOUS | Status: DC

## 2021-09-23 MED ORDER — PANTOPRAZOLE SODIUM 40 MG PO TBEC
40.0000 mg | DELAYED_RELEASE_TABLET | Freq: Two times a day (BID) | ORAL | Status: DC
Start: 2021-09-23 — End: 2021-09-25
  Administered 2021-09-23 – 2021-09-25 (×4): 40 mg via ORAL
  Filled 2021-09-23 (×4): qty 1

## 2021-09-23 MED ORDER — LACTATED RINGERS IV SOLN: INTRAVENOUS | Status: AC

## 2021-09-23 NOTE — Progress Notes (Signed)
[facility]  PROGRESS NOTE  Kelli Hudson [id number]:[id number] DOB: 1991/01/10 DOA: 09/20/2021 PCP: Loyola Mast, MD   Brief HPI:    31 year old female with 15-year history of diabetes mellitus type 1, poorly controlled, last A1c from last month was 12.2 with recurrent admissions for diabetic gastroparesis, history of hypertension, GERD came to ER with abdominal pain.  This was patient's 10th admission in 2023 for diabetic gastroparesis and abdominal pain.  Patient is no longer on insulin pump.  She is followed by [facility] endocrinology.  She was seen there in March and was seen on pump.  Patient says every 2 insurance issues she is no longer on the pump and is doing subcutaneous Humulin R.   Subjective   Patient seen and examined, nausea and vomiting has improved.  She also tried soft diet.   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 -Started on Lantus and sliding scale insulin every 4 hours -CBG well controlled  Acute kidney injury -Creatinine was elevated to 1.60 -Improved to 1.07 after starting LR at 100 mL/h  -Continue IV fluids         Medications     amLODipine  5 mg Oral Daily   heparin  5,000 Units Subcutaneous Q8H   insulin aspart  0-9 Units Subcutaneous Q4H   insulin glargine-yfgn  15 Units Subcutaneous Daily   metoCLOPramide (REGLAN) injection  10 mg Intravenous Q6H   metoprolol tartrate  2.5 mg Intravenous Q6H   pantoprazole  40 mg Oral BID     Data Reviewed:   CBG:  Recent Labs  Lab 09/22/21 1604 09/22/21 2018 09/23/21 0003 09/23/21 0520 09/23/21 0903  GLUCAP  176* 120* 134* 118* 163*    SpO2: 99 %    Vitals:   09/22/21 1502 09/22/21 2125 09/23/21 0500 09/23/21 0517  BP: 132/85 120/81  126/85  Pulse: (!) 104 100  (!) 103  Resp: 16 20  20   Temp: 98 F (36.7 C) 98.4 F (36.9 C)  98.5 F (36.9 C)  TempSrc: Oral Oral  Oral  SpO2: 99% 100%  99%  Weight:   82.5 kg   Height:          Data Reviewed:  Basic Metabolic Panel: Recent Labs  Lab 09/20/21 1942 09/22/21 0715 09/23/21 0307  NA 136 136 136  K 4.5 4.3 4.6  CL 101 105 105  CO2 21* 26 25  GLUCOSE 430* 164* 105*  BUN 27* 29* 21*  CREATININE 1.06* 1.60* 1.07*  CALCIUM 9.9 9.3 9.4  MG 1.9  --   --     CBC: Recent Labs  Lab 09/20/21 1942 09/22/21 0443  WBC 7.0 13.4*  NEUTROABS 5.1  --   HGB 12.8 10.9*  HCT 39.1 34.2*  MCV 87.7 91.4  PLT 454* 334    LFT Recent Labs  Lab 09/20/21 1942 09/22/21 0715  AST 22 16  ALT 19 14  ALKPHOS 92 68  BILITOT 1.1 0.7  PROT 8.2* 6.3*  ALBUMIN 3.6 2.8*     Antibiotics: Anti-infectives (From admission, onward)    Start     Dose/Rate Route Frequency Ordered  Stop   09/21/21 0400  erythromycin 250 mg in sodium chloride 0.9 % 100 mL IVPB        250 mg 100 mL/hr over 60 Minutes Intravenous Every 6 hours 09/21/21 0349          DVT prophylaxis: Heparin  Code Status: Full code  Family Communication:    CONSULTS    Objective    Physical Examination:  General-appears in no acute distress Heart-S1-S2, regular, no murmur auscultated Lungs-clear to auscultation bilaterally, no wheezing or crackles auscultated Abdomen-mild tenderness to palpation in epigastric region, no organomegaly Extremities-no edema in the lower extremities Neuro-alert, oriented x3, no focal deficit noted  Status is: Inpatient:           Meredeth Ide   [facility] If 7PM-7AM, please contact night-coverage at [email or web address], Office  [phone]   09/23/2021, 11:32 AM  LOS: 2 days

## 2021-09-24 DIAGNOSIS — K219 Gastro-esophageal reflux disease without esophagitis: Secondary | ICD-10-CM | POA: Diagnosis not present

## 2021-09-24 DIAGNOSIS — I152 Hypertension secondary to endocrine disorders: Secondary | ICD-10-CM | POA: Diagnosis not present

## 2021-09-24 DIAGNOSIS — K3184 Gastroparesis: Secondary | ICD-10-CM | POA: Diagnosis not present

## 2021-09-24 DIAGNOSIS — E1143 Type 2 diabetes mellitus with diabetic autonomic (poly)neuropathy: Secondary | ICD-10-CM | POA: Diagnosis not present

## 2021-09-24 DIAGNOSIS — E1159 Type 2 diabetes mellitus with other circulatory complications: Secondary | ICD-10-CM | POA: Diagnosis not present

## 2021-09-24 DIAGNOSIS — E1042 Type 1 diabetes mellitus with diabetic polyneuropathy: Secondary | ICD-10-CM | POA: Diagnosis not present

## 2021-09-24 DIAGNOSIS — R112 Nausea with vomiting, unspecified: Secondary | ICD-10-CM | POA: Diagnosis not present

## 2021-09-24 DIAGNOSIS — I10 Essential (primary) hypertension: Secondary | ICD-10-CM | POA: Diagnosis not present

## 2021-09-24 LAB — GLUCOSE, CAPILLARY
Glucose-Capillary: 119 mg/dL — ABNORMAL HIGH (ref 70–99)
Glucose-Capillary: 127 mg/dL — ABNORMAL HIGH (ref 70–99)
Glucose-Capillary: 131 mg/dL — ABNORMAL HIGH (ref 70–99)
Glucose-Capillary: 159 mg/dL — ABNORMAL HIGH (ref 70–99)
Glucose-Capillary: 190 mg/dL — ABNORMAL HIGH (ref 70–99)
Glucose-Capillary: 192 mg/dL — ABNORMAL HIGH (ref 70–99)

## 2021-09-24 MED ORDER — LACTATED RINGERS IV SOLN: INTRAVENOUS | Status: AC

## 2021-09-24 NOTE — Progress Notes (Signed)
  Transition of Care [facility]) Screening Note   Patient Details  Name: Kelli Hudson Date of Birth: April 20, 1990   Transition of Care [facility]) CM/SW Contact:    Lavenia Atlas, RN Phone Number: 09/24/2021, 4:34 PM    Transition of Care Department [facility]) has reviewed patient and no TOC needs have been identified at this time. We will continue to monitor patient advancement through interdisciplinary progression rounds. If new patient transition needs arise, please place a TOC consult.

## 2021-09-24 NOTE — Progress Notes (Signed)
[facility]  PROGRESS NOTE  Kelli Hudson [id number]:[id number] DOB: 08/21/1990 DOA: 09/20/2021 PCP: Loyola Mast, MD   Brief HPI:    31 year old female with 15-year history of diabetes mellitus type 1, poorly controlled, last A1c from last month was 12.2 with recurrent admissions for diabetic gastroparesis, history of hypertension, GERD came to ER with abdominal pain.  This was patient's 10th admission in 2023 for diabetic gastroparesis and abdominal pain.  Patient is no longer on insulin pump.  She is followed by [facility] endocrinology.  She was seen there in March and was seen on pump.  Patient says every 2 insurance issues she is no longer on the pump and is doing subcutaneous Humulin R.   Subjective   Patient tolerated soft diet, wants to advance diet to regular diet.  Still has abdominal tenderness.   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 -Started on Lantus and sliding scale insulin every 4 hours -CBG well controlled  Acute kidney injury -Creatinine was elevated to 1.60 -Improved to 1.07 after starting LR at 100 mL/h  -Continue IV fluids     Medications     amLODipine  5 mg Oral Daily   heparin  5,000 Units Subcutaneous Q8H   insulin aspart  0-9 Units Subcutaneous Q4H   insulin glargine-yfgn  15 Units Subcutaneous Daily   metoCLOPramide (REGLAN) injection  10 mg Intravenous Q6H   metoprolol tartrate  2.5 mg Intravenous Q6H   pantoprazole  40 mg Oral BID     Data Reviewed:   CBG:  Recent Labs  Lab 09/24/21 0020 09/24/21 0504 09/24/21 0751 09/24/21 1148 09/24/21 1542  GLUCAP 159* 127* 131*  192* 119*    SpO2: 98 %    Vitals:   09/24/21 0042 09/24/21 0507 09/24/21 0927 09/24/21 1404  BP: 134/85 (!) 146/99 122/76 130/88  Pulse: (!) 110 (!) 108  99  Resp: 19 20  18   Temp: 99 F (37.2 C) 98.1 F (36.7 C)  98.8 F (37.1 C)  TempSrc: Oral Oral  Oral  SpO2: 98% 98%  98%  Weight:      Height:          Data Reviewed:  Basic Metabolic Panel: Recent Labs  Lab 09/20/21 1942 09/22/21 0715 09/23/21 0307  NA 136 136 136  K 4.5 4.3 4.6  CL 101 105 105  CO2 21* 26 25  GLUCOSE 430* 164* 105*  BUN 27* 29* 21*  CREATININE 1.06* 1.60* 1.07*  CALCIUM 9.9 9.3 9.4  MG 1.9  --   --     CBC: Recent Labs  Lab 09/20/21 1942 09/22/21 0443  WBC 7.0 13.4*  NEUTROABS 5.1  --   HGB 12.8 10.9*  HCT 39.1 34.2*  MCV 87.7 91.4  PLT 454* 334    LFT Recent Labs  Lab 09/20/21 1942 09/22/21 0715  AST 22 16  ALT 19 14  ALKPHOS 92 68  BILITOT 1.1 0.7  PROT 8.2* 6.3*  ALBUMIN 3.6 2.8*     Antibiotics: Anti-infectives (From admission, onward)    Start     Dose/Rate Route Frequency Ordered Stop   09/21/21 0400  erythromycin 250 mg in sodium chloride 0.9 % 100 mL IVPB        250 mg 100 mL/hr over 60 Minutes Intravenous Every 6 hours 09/21/21 0349          DVT prophylaxis: Heparin  Code Status: Full code  Family Communication:    CONSULTS    Objective    Physical Examination:  General-appears in no acute distress Heart-S1-S2, regular, no murmur auscultated Lungs-clear to auscultation bilaterally, no wheezing or crackles auscultated Abdomen-soft, mild tenderness in epigastric region  no organomegaly Extremities-no edema in the lower extremities Neuro-alert, oriented x3, no focal deficit noted  Status is: Inpatient:        Meredeth Ide   [facility] If 7PM-7AM, please contact night-coverage at [email or web address], Office  [phone]   09/24/2021, 5:46 PM  LOS: 3 days

## 2021-09-24 NOTE — Plan of Care (Signed)

## 2021-09-25 DIAGNOSIS — K3184 Gastroparesis: Secondary | ICD-10-CM | POA: Diagnosis not present

## 2021-09-25 DIAGNOSIS — E1143 Type 2 diabetes mellitus with diabetic autonomic (poly)neuropathy: Secondary | ICD-10-CM | POA: Diagnosis not present

## 2021-09-25 LAB — GLUCOSE, CAPILLARY
Glucose-Capillary: 125 mg/dL — ABNORMAL HIGH (ref 70–99)
Glucose-Capillary: 117 mg/dL — ABNORMAL HIGH (ref 70–99)
Glucose-Capillary: 133 mg/dL — ABNORMAL HIGH (ref 70–99)
Glucose-Capillary: 147 mg/dL — ABNORMAL HIGH (ref 70–99)

## 2021-09-25 LAB — CBC
HCT: 32.3 % — ABNORMAL LOW (ref 36.0–46.0)
Hemoglobin: 10.6 g/dL — ABNORMAL LOW (ref 12.0–15.0)
MCH: 29 pg (ref 26.0–34.0)
MCHC: 32.8 g/dL (ref 30.0–36.0)
MCV: 88.3 fL (ref 80.0–100.0)
Platelets: 380 10*3/uL (ref 150–400)
RBC: 3.66 MIL/uL — ABNORMAL LOW (ref 3.87–5.11)
RDW: 13.4 % (ref 11.5–15.5)
WBC: 10.3 10*3/uL (ref 4.0–10.5)
nRBC: 0 % (ref 0.0–0.2)

## 2021-09-25 LAB — BASIC METABOLIC PANEL
Anion gap: 8 (ref 5–15)
BUN: 12 mg/dL (ref 6–20)
CO2: 26 mmol/L (ref 22–32)
Calcium: 9.2 mg/dL (ref 8.9–10.3)
Chloride: 106 mmol/L (ref 98–111)
Creatinine, Ser: 0.95 mg/dL (ref 0.44–1.00)
GFR, Estimated: >60 mL/min (ref >60)
Glucose, Bld: 126 mg/dL — ABNORMAL HIGH (ref 70–99)
Potassium: 3.8 mmol/L (ref 3.5–5.1)
Sodium: 140 mmol/L (ref 135–145)

## 2021-09-25 NOTE — Discharge Summary (Addendum)
Physician Discharge Summary  Kelli Hudson XBJ:[id number] DOB: 06-Dec-1990 DOA: 09/20/2021  PCP: [name], MD  Admit date: 09/20/2021  Discharge date: 09/25/2021  Admitted From: Home  Disposition: Home  Recommendations for Outpatient Follow-up:  Follow up with PCP in 1-2 weeks Please obtain BMP/CBC in one week Advised to continue Zofran as needed for nausea and vomiting. Advised to follow-up with endocrinologist as scheduled  Home Health:None Equipment/Devices:None  Discharge Condition: Stable CODE STATUS:Full code Diet recommendation: Carb Modified   Brief Summary / Hospital Course: This 31 year old female with 15-year history of diabetes mellitus type 1, poorly controlled, last A1c from last month was 12.2 with recurrent admissions for diabetic gastroparesis, history of hypertension, GERD came to ER with abdominal pain.  This was patient's 10th admission in 2023 for diabetic gastroparesis and abdominal pain.  Patient is no longer on insulin pump. She is being followed by [facility] endocrinology.  She was seen there in March and was sent on pump.  Patient says she has insurance issues she is no longer on the pump and is doing subcutaneous Humulin R. Patient was admitted for diabetic gastroparesis,  Started on Reglan and IV erythromycin.  She had persistent nausea and vomiting during hospitalization.  Continued on Reglan and erythromycin.  Nausea and vomiting has improved,  She felt much better,  abdominal pain has resolved.  Patient wants to be discharged.  Patient is being discharged home,  advised to follow-up with the endocrinologist and PCP.   Discharge Diagnoses:  Principal Problem:   Diabetic gastroparesis ([place]) Active Problems:   Hypertension associated with diabetes ([place])   Type 1 diabetes mellitus with diabetic polyneuropathy ([facility])   Intractable nausea and vomiting   Essential hypertension   GERD without esophagitis   Drug-seeking behavior  Diabetic  gastroparesis -Patient on scheduled IV Reglan and IV erythromycin -Patient is tolerating   soft diet; will advance to regular diet -Continue Dilaudid 1 mg IV every 4 hours as needed for pain -She feels much better and wants to be discharged.   GERD without esophagitis -Continue Protonix 40 mg twice daily   Hypertension -Blood pressure remains controlled. Continue Coreg.   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  Discharge Instructions  Discharge Instructions     Call MD for:  difficulty breathing, headache or visual disturbances   Complete by: As directed    Call MD for:  persistant nausea and vomiting   Complete by: As directed    Diet - low sodium heart healthy   Complete by: As directed    Diet Carb Modified   Complete by: As directed    Discharge instructions   Complete by: As directed    Advised to follow-up with primary care physician in 1 week. Advised to continue Zofran as needed for nausea and vomiting. Advised to follow-up with endocrinologist as scheduled   Increase activity slowly   Complete by: As directed       Allergies as of 09/25/2021   No Known Allergies      Medication List     TAKE these medications    albuterol 108 (90 Base) MCG/ACT inhaler Commonly known as: VENTOLIN HFA Inhale 2 puffs into the lungs every 6 (six) hours as needed for shortness of breath.   atorvastatin  20 MG tablet Commonly known as: LIPITOR Take 1 tablet (20 mg total) by mouth daily.   blood glucose meter kit and supplies Dispense based on patient and insurance preference. Use up to four times daily as directed. (FOR ICD-10 E10.9, E11.9).   blood glucose meter kit and supplies Dispense based on patient and insurance preference.  Use up to four times daily as directed. (FOR ICD-10 E10.9, E11.9).   carvedilol 6.25 MG tablet Commonly known as: COREG Take 1 tablet (6.25 mg total) by mouth 2 (two) times daily with a meal.   Dexcom G6 Sensor Misc 1 each by Does not apply route daily.   Dexcom G6 Transmitter Misc USE AS DIRECTED   dicyclomine 20 MG tablet Commonly known as: BENTYL Take 1 tablet (20 mg total) by mouth 3 (three) times daily as needed for spasms.   hydrOXYzine 25 MG capsule Commonly known as: VISTARIL Take 1 capsule (25 mg total) by mouth every 8 (eight) hours as needed. What changed: reasons to take this   insulin regular 100 units/mL injection Commonly known as: NovoLIN R Inject 0-0.15 mLs (0-15 Units total) into the skin See admin instructions. Via pump.Start this medication after you follow up with your PCP What changed: additional instructions   Levemir FlexTouch 100 UNIT/ML FlexPen Generic drug: insulin detemir Inject 16 Units into the skin daily. What changed: how much to take   ondansetron 4 MG tablet Commonly known as: Zofran Take 1 tablet (4 mg total) by mouth every 8 (eight) hours as needed for nausea or vomiting.   pantoprazole 40 MG tablet Commonly known as: PROTONIX Take 1 tablet (40 mg total) by mouth 2 (two) times daily before a meal.   Pen Needles 3/16" 31G X 5 MM Misc 1 each by Does not apply route in the morning, at noon, and at bedtime.   prochlorperazine 10 MG tablet Commonly known as: COMPAZINE Take 1/2-1 tablet (5-10 mg total) by mouth every 8 (eight) hours as needed for nausea or vomiting.   True Metrix Blood Glucose Test test strip Generic drug: glucose blood Use as instructed What changed:  how much to take how to take this when to take this   TRUEplus Lancets 28G Misc assist with checking blood sugar TID and qhs What changed:  how much to take how to take this when to take this        Follow-up Information     [name], MD Follow up in  1 week(s).   Specialty: Family Medicine Contact information: [place] [address] [phone]                No Known Allergies  Consultations: None   Procedures/Studies: No results found.    Subjective: Patient was seen and examined at bedside.  Overnight events noted.   Patient reports feeling much better and want to be discharged.   Patient being discharged home,  nausea and vomiting is improved.  Abdominal pain is resolved.  Discharge Exam: Vitals:   09/25/21 0021 09/25/21 0514  BP: (!) 145/90 (!) 143/92  Pulse: (!) 105 (!) 105  Resp:    Temp:  99 F (37.2 C)  SpO2: 99% 97%   Vitals:   09/24/21 1804 09/24/21 2111 09/25/21 0021 09/25/21 0514  BP: 132/86 123/86 (!) 145/90 (!) 143/92  Pulse: 98 99 (!) 105 (!) 105  Resp:  19    Temp:  98.3 F (36.8 C)  99 F (37.2 C)  TempSrc:  Oral  Oral  SpO2:  100% 99% 97%  Weight:      Height:        General: Pt is alert, awake, not in acute distress Cardiovascular: RRR, S1/S2 +, no rubs, no gallops Respiratory: CTA bilaterally, no wheezing, no rhonchi Abdominal: Soft, NT, ND, bowel sounds + Extremities: no edema, no cyanosis    The results of significant diagnostics from this hospitalization (including imaging, microbiology, ancillary and laboratory) are listed below for reference.     Microbiology: No results found for this or any previous visit (from the past 240 hour(s)).   Labs: BNP (last 3 results) No results for input(s): "BNP" in the last 8760 hours. Basic Metabolic Panel: Recent Labs  Lab 09/20/21 1942 09/22/21 0715 09/23/21 0307 09/25/21 0338  NA 136 136 136 140  K 4.5 4.3 4.6 3.8  CL 101 105 105 106  CO2 21* 26 25 26   GLUCOSE 430* 164* 105* 126*  BUN 27* 29* 21* 12  CREATININE 1.06* 1.60* 1.07* 0.95  CALCIUM 9.9 9.3 9.4 9.2  MG 1.9  --   --   --    Liver Function Tests: Recent Labs  Lab 09/20/21 1942 09/22/21 0715  AST 22 16  ALT 19 14  ALKPHOS  92 68  BILITOT 1.1 0.7  PROT 8.2* 6.3*  ALBUMIN 3.6 2.8*   Recent Labs  Lab 09/20/21 1942  LIPASE 23   No results for input(s): "AMMONIA" in the last 168 hours. CBC: Recent Labs  Lab 09/20/21 1942 09/22/21 0443 09/25/21 0338  WBC 7.0 13.4* 10.3  NEUTROABS 5.1  --   --   HGB 12.8 10.9* 10.6*  HCT 39.1 34.2* 32.3*  MCV 87.7 91.4 88.3  PLT 454* 334 380   Cardiac Enzymes: No results for input(s): "CKTOTAL", "CKMB", "CKMBINDEX", "TROPONINI" in the last 168 hours. BNP: Invalid input(s): "POCBNP" CBG: Recent Labs  Lab 09/24/21 1542 09/24/21 2109 09/25/21 0208 09/25/21 0608 09/25/21 0744  GLUCAP 119* 190* 125* 117* 133*   D-Dimer No results for input(s): "DDIMER" in the last 72 hours. Hgb A1c No results for input(s): "HGBA1C" in the last 72 hours. Lipid Profile No results for input(s): "CHOL", "HDL", "LDLCALC", "TRIG", "CHOLHDL", "LDLDIRECT" in the last 72 hours. Thyroid function studies No results for input(s): "TSH", "T4TOTAL", "T3FREE", "THYROIDAB" in the last 72 hours.  Invalid input(s): "FREET3" Anemia work up No results for input(s): "VITAMINB12", "FOLATE", "FERRITIN", "TIBC", "IRON", "RETICCTPCT" in the last 72 hours. Urinalysis    Component Value Date/Time   COLORURINE YELLOW 09/20/2021 2253   APPEARANCEUR CLEAR 09/20/2021 2253   LABSPEC 1.026 09/20/2021 2253   PHURINE 5.0 09/20/2021 2253   GLUCOSEU >=500 (A) 09/20/2021 2253   GLUCOSEU NEGATIVE 05/22/2020 1417   HGBUR SMALL (A) 09/20/2021 2253   BILIRUBINUR NEGATIVE 09/20/2021 2253   KETONESUR 80 (A) 09/20/2021 2253   PROTEINUR >=300 (A) 09/20/2021 2253   UROBILINOGEN 0.2 05/22/2020 1417   NITRITE NEGATIVE 09/20/2021 2253   LEUKOCYTESUR NEGATIVE 09/20/2021 2253   Sepsis Labs Recent Labs  Lab 09/20/21 1942 09/22/21 0443 09/25/21 0338  WBC 7.0 13.4* 10.3   Microbiology No results found for this or any previous visit (from the past 240 hour(s)).   Time coordinating discharge: Over 30  minutes  SIGNED:   Shawna Clamp, MD  [facility] 09/25/2021, 10:50 AM Pager   If 7PM-7AM, please contact night-coverage \

## 2021-09-25 NOTE — Discharge Instructions (Signed)
Advised to follow-up with primary care physician in 1 week. Advised to continue Zofran as needed for nausea and vomiting. Advised to follow-up with endocrinologist as scheduled

## 2021-09-25 NOTE — Plan of Care (Signed)
  Problem: Education: Goal: Knowledge of General Education information will improve Description Including pain rating scale, medication(s)/side effects and non-pharmacologic comfort measures Outcome: Progressing   Problem: Health Behavior/Discharge Planning: Goal: Ability to manage health-related needs will improve Outcome: Progressing   

## 2021-09-25 NOTE — Progress Notes (Signed)
Provided and discussed discharge instructions. Addressed al questions and concerns. Iv removed intact. Val Eagle

## 2021-09-26 ENCOUNTER — Telehealth: Payer: Self-pay

## 2021-09-26 NOTE — Telephone Encounter (Signed)
Transition Care Management Unsuccessful Follow-up Telephone Call  Date of discharge and from where:  [facility] 09/25/2021  Attempts:  1st Attempt  Reason for unsuccessful TCM follow-up call:  No answer/busy

## 2021-09-27 ENCOUNTER — Telehealth: Payer: Self-pay

## 2021-09-27 NOTE — Telephone Encounter (Signed)
Transition Care Management Follow-up Telephone Call Date of discharge and from where: 09/25/2021 [facility] How have you been since you were released from the hospital? [facility] Any questions or concerns? No  Items Reviewed: Did the pt receive and understand the discharge instructions provided? Yes  Medications obtained and verified?  N/a Other? No  Any new allergies since your discharge? No  Dietary orders reviewed? Yes Do you have support at home? Yes   Home Care and Equipment/Supplies: Were home health services ordered? not applicable If so, what is the name of the agency? N/a  Has the agency set up a time to come to the patient's home? not applicable Were any new equipment or medical supplies ordered?  No What is the name of the medical supply agency? N/a Were you able to get the supplies/equipment? not applicable Do you have any questions related to the use of the equipment or supplies? No  Functional Questionnaire: (I = Independent and D = Dependent) ADLs: I  Bathing/Dressing- I  Meal Prep- I  Eating- I  Maintaining continence- I  Transferring/Ambulation- I  Managing Meds- I  Follow up appointments reviewed:  PCP Hospital f/u appt confirmed? Yes  Scheduled to see Veto Kemps, MD on 10/04/2021 @ 2:20pm. Specialist Hospital f/u appt confirmed?  N/a  Scheduled to see n/a on n/a @ n/a. Are transportation arrangements needed? No  If their condition worsens, is the pt aware to call PCP or go to the Emergency Dept.? Yes Was the patient provided with contact information for the PCP's office or ED? Yes Was to pt encouraged to call back with questions or concerns? Yes  Arvil Persons, RN Clinical Supervisor

## 2021-09-27 NOTE — Telephone Encounter (Signed)
Transition Care Management Unsuccessful Follow-up Telephone Call  Date of discharge and from where:  [facility]  Attempts:  2nd Attempt  Reason for unsuccessful TCM follow-up call:  No answer/busy

## 2021-10-04 ENCOUNTER — Other Ambulatory Visit: Payer: Self-pay

## 2021-10-04 ENCOUNTER — Encounter (HOSPITAL_COMMUNITY): Payer: Self-pay

## 2021-10-04 ENCOUNTER — Observation Stay (HOSPITAL_COMMUNITY): Payer: 59

## 2021-10-04 ENCOUNTER — Emergency Department (HOSPITAL_COMMUNITY): Payer: 59

## 2021-10-04 ENCOUNTER — Inpatient Hospital Stay (HOSPITAL_COMMUNITY)
Admission: EM | Admit: 2021-10-04 | Discharge: 2021-10-07 | DRG: 074 | Disposition: A | Discharge status: Home / Self Care | Payer: 59 | Attending: Internal Medicine | Admitting: Internal Medicine

## 2021-10-04 ENCOUNTER — Telehealth: Payer: Self-pay | Admitting: Family Medicine

## 2021-10-04 DIAGNOSIS — K3184 Gastroparesis: Secondary | ICD-10-CM | POA: Diagnosis present

## 2021-10-04 DIAGNOSIS — Z794 Long term (current) use of insulin: Secondary | ICD-10-CM

## 2021-10-04 DIAGNOSIS — Z800 Family history of malignant neoplasm of digestive organs: Secondary | ICD-10-CM

## 2021-10-04 DIAGNOSIS — E1043 Type 1 diabetes mellitus with diabetic autonomic (poly)neuropathy: Secondary | ICD-10-CM | POA: Diagnosis not present

## 2021-10-04 DIAGNOSIS — E1069 Type 1 diabetes mellitus with other specified complication: Secondary | ICD-10-CM | POA: Diagnosis present

## 2021-10-04 DIAGNOSIS — R1110 Vomiting, unspecified: Principal | ICD-10-CM

## 2021-10-04 DIAGNOSIS — Z833 Family history of diabetes mellitus: Secondary | ICD-10-CM

## 2021-10-04 DIAGNOSIS — E785 Hyperlipidemia, unspecified: Secondary | ICD-10-CM | POA: Diagnosis present

## 2021-10-04 DIAGNOSIS — N179 Acute kidney failure, unspecified: Secondary | ICD-10-CM

## 2021-10-04 DIAGNOSIS — K219 Gastro-esophageal reflux disease without esophagitis: Secondary | ICD-10-CM | POA: Diagnosis present

## 2021-10-04 DIAGNOSIS — Z79899 Other long term (current) drug therapy: Secondary | ICD-10-CM

## 2021-10-04 DIAGNOSIS — Z818 Family history of other mental and behavioral disorders: Secondary | ICD-10-CM

## 2021-10-04 DIAGNOSIS — R112 Nausea with vomiting, unspecified: Secondary | ICD-10-CM | POA: Diagnosis present

## 2021-10-04 DIAGNOSIS — E1159 Type 2 diabetes mellitus with other circulatory complications: Secondary | ICD-10-CM | POA: Diagnosis present

## 2021-10-04 DIAGNOSIS — Z801 Family history of malignant neoplasm of trachea, bronchus and lung: Secondary | ICD-10-CM

## 2021-10-04 DIAGNOSIS — Z803 Family history of malignant neoplasm of breast: Secondary | ICD-10-CM

## 2021-10-04 DIAGNOSIS — R000 Tachycardia, unspecified: Secondary | ICD-10-CM | POA: Diagnosis not present

## 2021-10-04 DIAGNOSIS — E1143 Type 2 diabetes mellitus with diabetic autonomic (poly)neuropathy: Secondary | ICD-10-CM | POA: Diagnosis present

## 2021-10-04 DIAGNOSIS — Z8041 Family history of malignant neoplasm of ovary: Secondary | ICD-10-CM

## 2021-10-04 DIAGNOSIS — I10 Essential (primary) hypertension: Secondary | ICD-10-CM | POA: Diagnosis present

## 2021-10-04 DIAGNOSIS — I152 Hypertension secondary to endocrine disorders: Secondary | ICD-10-CM | POA: Diagnosis present

## 2021-10-04 DIAGNOSIS — N1832 Chronic kidney disease, stage 3b: Secondary | ICD-10-CM

## 2021-10-04 DIAGNOSIS — E109 Type 1 diabetes mellitus without complications: Secondary | ICD-10-CM | POA: Diagnosis present

## 2021-10-04 LAB — COMPREHENSIVE METABOLIC PANEL
ALT: 22 U/L (ref 0–44)
AST: 22 U/L (ref 15–41)
Albumin: 3.8 g/dL (ref 3.5–5.0)
Alkaline Phosphatase: 92 U/L (ref 38–126)
Anion gap: 10 (ref 5–15)
BUN: 24 mg/dL — ABNORMAL HIGH (ref 6–20)
CO2: 24 mmol/L (ref 22–32)
Calcium: 9.8 mg/dL (ref 8.9–10.3)
Chloride: 100 mmol/L (ref 98–111)
Creatinine, Ser: 1.23 mg/dL — ABNORMAL HIGH (ref 0.44–1.00)
GFR, Estimated: >60 mL/min (ref >60)
Glucose, Bld: 374 mg/dL — ABNORMAL HIGH (ref 70–99)
Potassium: 3.8 mmol/L (ref 3.5–5.1)
Sodium: 134 mmol/L — ABNORMAL LOW (ref 135–145)
Total Bilirubin: 1.3 mg/dL — ABNORMAL HIGH (ref 0.3–1.2)
Total Protein: 8.4 g/dL — ABNORMAL HIGH (ref 6.5–8.1)

## 2021-10-04 LAB — CBC
HCT: 38.3 % (ref 36.0–46.0)
Hemoglobin: 12.6 g/dL (ref 12.0–15.0)
MCH: 28.8 pg (ref 26.0–34.0)
MCHC: 32.9 g/dL (ref 30.0–36.0)
MCV: 87.4 fL (ref 80.0–100.0)
Platelets: 472 10*3/uL — ABNORMAL HIGH (ref 150–400)
RBC: 4.38 MIL/uL (ref 3.87–5.11)
RDW: 13.3 % (ref 11.5–15.5)
WBC: 9.6 10*3/uL (ref 4.0–10.5)
nRBC: 0 % (ref 0.0–0.2)

## 2021-10-04 LAB — URINALYSIS, ROUTINE W REFLEX MICROSCOPIC
Bilirubin Urine: NEGATIVE
Glucose, UA: >=500 mg/dL — AB
Ketones, ur: 5 mg/dL — AB
Leukocytes,Ua: NEGATIVE
Nitrite: NEGATIVE
Protein, ur: >=300 mg/dL — AB
Specific Gravity, Urine: 1.02 (ref 1.005–1.030)
pH: 5 (ref 5.0–8.0)

## 2021-10-04 LAB — I-STAT BETA HCG BLOOD, ED (MC, WL, AP ONLY): I-stat hCG, quantitative: <5 m[IU]/mL (ref <5)

## 2021-10-04 LAB — CBG MONITORING, ED: Glucose-Capillary: 359 mg/dL — ABNORMAL HIGH (ref 70–99)

## 2021-10-04 LAB — LIPASE, BLOOD: Lipase: 24 U/L (ref 11–51)

## 2021-10-04 LAB — GLUCOSE, CAPILLARY: Glucose-Capillary: 247 mg/dL — ABNORMAL HIGH (ref 70–99)

## 2021-10-04 MED ORDER — ATORVASTATIN CALCIUM 20 MG PO TABS
20.0000 mg | ORAL_TABLET | Freq: Every day | ORAL | Status: DC
  Administered 2021-10-05 – 2021-10-07 (×3): 20 mg via ORAL
  Filled 2021-10-04 (×3): qty 1

## 2021-10-04 MED ORDER — INSULIN ASPART 100 UNIT/ML IJ SOLN
0.0000 [IU] | Freq: Three times a day (TID) | INTRAMUSCULAR | Status: DC
  Administered 2021-10-05: 3 [IU] via SUBCUTANEOUS
  Administered 2021-10-05 – 2021-10-06 (×3): 2 [IU] via SUBCUTANEOUS
  Administered 2021-10-06: 8 [IU] via SUBCUTANEOUS
  Administered 2021-10-07 (×3): 3 [IU] via SUBCUTANEOUS
  Filled 2021-10-04: qty 0.15

## 2021-10-04 MED ORDER — INSULIN DETEMIR 100 UNIT/ML ~~LOC~~ SOLN
16.0000 [IU] | Freq: Once | SUBCUTANEOUS | Status: AC
Start: 2021-10-04 — End: 2021-10-04
  Administered 2021-10-04: 16 [IU] via SUBCUTANEOUS
  Filled 2021-10-04: qty 0.16

## 2021-10-04 MED ORDER — OXYCODONE HCL 5 MG PO TABS
5.0000 mg | ORAL_TABLET | ORAL | Status: DC | PRN
  Administered 2021-10-04 – 2021-10-06 (×3): 5 mg via ORAL
  Filled 2021-10-04 (×4): qty 1

## 2021-10-04 MED ORDER — METOCLOPRAMIDE HCL 5 MG/ML IJ SOLN
10.0000 mg | Freq: Once | INTRAMUSCULAR | Status: AC
  Administered 2021-10-04: 10 mg via INTRAVENOUS
  Filled 2021-10-04: qty 2

## 2021-10-04 MED ORDER — INSULIN ASPART 100 UNIT/ML IJ SOLN
0.0000 [IU] | Freq: Every day | INTRAMUSCULAR | Status: DC
  Administered 2021-10-04: 2 [IU] via SUBCUTANEOUS
  Filled 2021-10-04: qty 0.05

## 2021-10-04 MED ORDER — ACETAMINOPHEN 650 MG RE SUPP: 650.0000 mg | Freq: Four times a day (QID) | RECTAL | Status: DC | PRN

## 2021-10-04 MED ORDER — METOPROLOL TARTRATE 5 MG/5ML IV SOLN
5.0000 mg | Freq: Four times a day (QID) | INTRAVENOUS | Status: DC | PRN
  Filled 2021-10-04: qty 5

## 2021-10-04 MED ORDER — SODIUM CHLORIDE 0.9 % IV BOLUS
1000.0000 mL | Freq: Once | INTRAVENOUS | Status: AC
  Administered 2021-10-04: 1000 mL via INTRAVENOUS

## 2021-10-04 MED ORDER — DOCUSATE SODIUM 100 MG PO CAPS
100.0000 mg | ORAL_CAPSULE | Freq: Two times a day (BID) | ORAL | Status: DC
  Administered 2021-10-05: 100 mg via ORAL
  Filled 2021-10-04 (×2): qty 1

## 2021-10-04 MED ORDER — ACETAMINOPHEN 325 MG PO TABS
650.0000 mg | ORAL_TABLET | Freq: Four times a day (QID) | ORAL | Status: DC | PRN
  Filled 2021-10-04: qty 2

## 2021-10-04 MED ORDER — INSULIN DETEMIR 100 UNIT/ML ~~LOC~~ SOLN
16.0000 [IU] | Freq: Every day | SUBCUTANEOUS | Status: DC
  Administered 2021-10-05 – 2021-10-07 (×3): 16 [IU] via SUBCUTANEOUS
  Filled 2021-10-04 (×3): qty 0.16

## 2021-10-04 MED ORDER — SODIUM CHLORIDE 0.9 % IV SOLN: INTRAVENOUS | Status: DC

## 2021-10-04 MED ORDER — ENOXAPARIN SODIUM 40 MG/0.4ML IJ SOSY
40.0000 mg | PREFILLED_SYRINGE | INTRAMUSCULAR | Status: DC
  Administered 2021-10-05 – 2021-10-07 (×3): 40 mg via SUBCUTANEOUS
  Filled 2021-10-04 (×3): qty 0.4

## 2021-10-04 MED ORDER — PANTOPRAZOLE SODIUM 40 MG IV SOLR
40.0000 mg | INTRAVENOUS | Status: DC
  Administered 2021-10-04 – 2021-10-07 (×4): 40 mg via INTRAVENOUS
  Filled 2021-10-04 (×4): qty 10

## 2021-10-04 MED ORDER — HYDROMORPHONE HCL 2 MG/ML IJ SOLN
1.0000 mg | Freq: Once | INTRAMUSCULAR | Status: AC
  Administered 2021-10-04: 1 mg via INTRAVENOUS
  Filled 2021-10-04: qty 1

## 2021-10-04 MED ORDER — HYDROMORPHONE HCL 1 MG/ML IJ SOLN
1.0000 mg | Freq: Once | INTRAMUSCULAR | Status: AC
  Administered 2021-10-04: 1 mg via INTRAVENOUS
  Filled 2021-10-04: qty 1

## 2021-10-04 MED ORDER — HYDROMORPHONE HCL 2 MG/ML IJ SOLN: 1.0000 mg | Freq: Once | INTRAMUSCULAR | Status: DC

## 2021-10-04 MED ORDER — PROCHLORPERAZINE EDISYLATE 10 MG/2ML IJ SOLN
10.0000 mg | Freq: Four times a day (QID) | INTRAMUSCULAR | Status: DC | PRN
  Administered 2021-10-04 – 2021-10-05 (×2): 10 mg via INTRAVENOUS
  Filled 2021-10-04 (×2): qty 2

## 2021-10-04 MED ORDER — CARVEDILOL 6.25 MG PO TABS
6.2500 mg | ORAL_TABLET | Freq: Two times a day (BID) | ORAL | Status: DC
  Administered 2021-10-05 – 2021-10-07 (×6): 6.25 mg via ORAL
  Filled 2021-10-04 (×6): qty 1

## 2021-10-04 MED ORDER — ONDANSETRON HCL 4 MG/2ML IJ SOLN
4.0000 mg | Freq: Once | INTRAMUSCULAR | Status: AC | PRN
Start: 2021-10-04 — End: 2021-10-04
  Administered 2021-10-04: 4 mg via INTRAVENOUS
  Filled 2021-10-04: qty 2

## 2021-10-04 NOTE — H&P (Signed)
History and Physical    Patient: Kelli Hudson TDD:[id number] DOB: [birth year] DOA: 10/04/2021 DOS: the patient was seen and examined on 10/04/2021 PCP: [name], MD  Patient coming from: Home  Chief Complaint:  Chief Complaint  Patient presents with   Emesis   HPI: Kelli Hudson is a 31 y.o. female with medical history significant of DMt1, DM gastroparesis, HTN. Presenting with N/V and abdominal pain. Multiple admissions and ED visits for the same this year. Her symptoms started last night abdominal cramping. This morning she started vomiting. She tried her normal nausea medications, but they were ineffective. She has not had any sick contacts, diarrhea, or fever. When her symptoms did not resolve, she became concerned and came to the ED for assistance. She denies any other aggravating or alleviating factors.     Review of Systems: As mentioned in the history of present illness. All other systems reviewed and are negative. Past Medical History:  Diagnosis Date   Acute H. pylori gastric ulcer    Diabetes mellitus ([place])    Diabetic gastroparesis ([place])    Gastroparesis    GERD (gastroesophageal reflux disease)    Hypertension    Hyperthyroidism    Past Surgical History:  Procedure Laterality Date   AMPUTATION TOE Left 03/10/2018   Procedure: AMPUTATION FIFTH TOE;  Surgeon: Trula Slade, DPM;  Location: [facility];  Service: Podiatry;  Laterality: Left;   BIOPSY  01/28/2020   Procedure: BIOPSY;  Surgeon: Otis Brace, MD;  Location: [facility];  Service: Gastroenterology;;   BOTOX INJECTION  08/26/2021   Procedure: BOTOX INJECTION;  Surgeon: Doran Stabler, MD;  Location: [facility];  Service: Gastroenterology;;   ESOPHAGOGASTRODUODENOSCOPY N/A 01/28/2020   Procedure: ESOPHAGOGASTRODUODENOSCOPY (EGD);  Surgeon: Otis Brace, MD;  Location: [facility];  Service: Gastroenterology;  Laterality: N/A;   ESOPHAGOGASTRODUODENOSCOPY  (EGD) WITH PROPOFOL Left 09/08/2015   Procedure: ESOPHAGOGASTRODUODENOSCOPY (EGD) WITH PROPOFOL;  Surgeon: Arta Silence, MD;  Location: [facility] END[facility]COPY;  Service: Endoscopy;  Laterality: Left;   ESOPHAGOGASTRODUODENOSCOPY (EGD) WITH PROPOFOL N/A 08/26/2021   Procedure: ESOPHAGOGASTRODUODENOSCOPY (EGD) WITH PROPOFOL;  Surgeon: Doran Stabler, MD;  Location: [facility];  Service: Gastroenterology;  Laterality: N/A;   WISDOM TOOTH EXTRACTION     Social History:  reports that she has never smoked. She has never used smokeless tobacco. She reports current alcohol use of about 1.0 standard drink of alcohol per week. She reports that she does not use drugs.  No Known Allergies  Family History  Problem Relation Age of Onset   Lung cancer Mother    Diabetes Mother    Bipolar disorder Father    Liver cancer Maternal Grandfather    Diabetes Maternal Aunt        x 2   Pancreatic cancer Maternal Uncle    Prostate cancer Maternal Uncle    Breast cancer Other        maternal great aunt   Heart disease Other    Ovarian cancer Other        maternal great aunt   Kidney disease Other        maternal great aunt   Colon cancer Neg Hx    Stomach cancer Neg Hx     Prior to Admission medications   Medication Sig Start Date End Date Taking? Authorizing Provider  albuterol (VENTOLIN HFA) 108 (90 Base) MCG/ACT inhaler Inhale 2 puffs into the lungs every 6 (six) hours as needed for shortness of breath.    [provider]  atorvastatin (LIPITOR) 20 MG tablet Take 1 tablet (20 mg total) by mouth daily. 04/10/21   [name], MD  blood glucose meter kit and supplies Dispense based on patient and insurance preference. Use up to four times daily as directed. (FOR ICD-10 E10.9, E11.9). 06/14/21   Shelly Coss, MD  blood glucose meter kit and supplies Dispense based on patient and insurance preference. Use up to four times daily as directed. (FOR ICD-10 E10.9, E11.9). 07/31/21   Georgette Shell, MD  carvedilol (COREG) 6.25 MG tablet Take 1 tablet (6.25 mg total) by mouth 2 (two) times daily with a meal. 07/01/21   Nita Sells, MD  Continuous Blood Gluc Sensor (DEXCOM G6 SENSOR) MISC 1 each by Does not apply route daily. 11/21/20   [name], MD  Continuous Blood Gluc Transmit (DEXCOM G6 TRANSMITTER) MISC USE AS DIRECTED 09/16/21   [name], MD  dicyclomine (BENTYL) 20 MG tablet Take 1 tablet (20 mg total) by mouth 3 (three) times daily as needed for spasms. 07/31/21   Georgette Shell, MD  glucose blood (TRUE METRIX BLOOD GLUCOSE TEST) test strip Use as instructed Patient taking differently: 1 each by Other route See admin instructions. Use as instructed 09/03/20   [name], MD  hydrOXYzine (VISTARIL) 25 MG capsule Take 1 capsule (25 mg total) by mouth every 8 (eight) hours as needed. Patient taking differently: Take 25 mg by mouth every 8 (eight) hours as needed for anxiety. 07/31/21   Georgette Shell, MD  insulin detemir (LEVEMIR FLEXTOUCH) 100 UNIT/ML FlexPen Inject 16 Units into the skin daily. Patient taking differently: Inject 18 Units into the skin daily. 06/14/21   Shelly Coss, MD  Insulin Pen Needle (PEN NEEDLES 3/16") 31G X 5 MM MISC 1 each by Does not apply route in the morning, at noon, and at bedtime. 06/14/21   Shelly Coss, MD  insulin regular (NOVOLIN R) 100 units/mL injection Inject 0-0.15 mLs (0-15 Units total) into the skin See admin instructions. Via pump.Start this medication after you follow up with your PCP Patient taking differently: Inject 0-15 Units into the skin See admin instructions. Via pump. 06/14/21   Shelly Coss, MD  ondansetron (ZOFRAN) 4 MG tablet Take 1 tablet (4 mg total) by mouth every 8 (eight) hours as needed for nausea or vomiting. 08/27/21 08/27/22  Caren Griffins, MD  pantoprazole (PROTONIX) 40 MG tablet Take 1 tablet (40 mg total) by mouth 2 (two) times daily before a meal. 08/19/21   Danford, Suann Larry,  MD  prochlorperazine (COMPAZINE) 10 MG tablet Take 1/2-1 tablet (5-10 mg total) by mouth every 8 (eight) hours as needed for nausea or vomiting. 08/27/21   Caren Griffins, MD  TRUEPLUS LANCETS 28G MISC assist with checking blood sugar TID and qhs Patient taking differently: 1 each by Other route See admin instructions. assist with checking blood sugar TID and qhs 09/10/15   Zettie Pho S, PA-C  insulin aspart (NOVOLOG) 100 UNIT/ML FlexPen Inject 5 Units into the skin 3 (three) times daily with meals. 02/23/18 07/05/19  Donne Hazel, MD    Physical Exam: Vitals:   10/04/21 1108 10/04/21 1123 10/04/21 1300  BP: (!) 135/101 (!) 134/105 (!) 182/129  Pulse: (!) 126 (!) 110 (!) 121  Resp: (!) _0 Temp: (!) 100.5 F (38.1 C) 99.1 F (37.3 C)   TempSrc: Oral Oral   SpO2: 100% 100% 100%   General: 31 y.o.  female resting in bed in NAD Eyes: PERRL, normal sclera ENMT: Nares patent w/o discharge, orophaynx clear, dentition normal, ears w/o discharge/lesions/ulcers Neck: Supple, trachea midline Cardiovascular: tachy, +S1, S2, no m/g/r, equal pulses throughout Respiratory: CTABL, no w/r/r, normal WOB GI: BS+, ND, mild global tenderness, no masses noted, no organomegaly noted MSK: No e/c/c Neuro: A&O x 3, no focal deficits Psyc: Appropriate interaction and affect, calm/cooperative  Data Reviewed:  Lab Results  Component Value Date   NA 134 (L) 10/04/2021   K 3.8 10/04/2021   CO2 24 10/04/2021   GLUCOSE 374 (H) 10/04/2021   BUN 24 (H) 10/04/2021   CREATININE 1.23 (H) 10/04/2021   CALCIUM 9.8 10/04/2021   EGFR 66 02/04/2021   GFRNONAA >60 10/04/2021   Lab Results  Component Value Date   ALT 22 10/04/2021   AST 22 10/04/2021   ALKPHOS 92 10/04/2021   BILITOT 1.3 (H) 10/04/2021   Lab Results  Component Value Date   WBC 9.6 10/04/2021   HGB 12.6 10/04/2021   HCT 38.3 10/04/2021   MCV 87.4 10/04/2021   PLT 472 (H) 10/04/2021   EKG: sinus tach, no st  elevations  Assessment and Plan: Intractable N/V DM gastroparesis     - place in obs, tele     - fluids, CLD, compazine  Uncontrolled DMt1     - continue home regimen     - add SSI     - DM coordinator     - last A1c: 10.9 on 09/21/21  HTN     - resume home regimen when confirmed     - PRN metoprolol   HLD     - continue home regimen when confirmed  GERD     - PPI  AKI     - check renal US     - watch nephrotoxins     - fluids  Advance Care Planning:   Code Status: FULL  Consults: None  Family Communication: None at bedside  Severity of Illness: The appropriate patient status for this patient is OBSERVATION. Observation status is judged to be reasonable and necessary in order to provide the required intensity of service to ensure the patient's safety. The patient's presenting symptoms, physical exam findings, and initial radiographic and laboratory data in the context of their medical condition is felt to place them at decreased risk for further clinical deterioration. Furthermore, it is anticipated that the patient will be medically stable for discharge from the hospital within 2 midnights of admission.   Author: Jonnie Finner, DO 10/04/2021 1:58 PM  For on call review [email or web address].

## 2021-10-04 NOTE — ED Provider Notes (Signed)
[facility] DEPT Provider Note   CSN: [id number] Arrival date & time: 10/04/21  1053     History  Chief Complaint  Patient presents with   Emesis    Kelli Hudson is a 31 y.o. female.  31 year old female with longstanding history of gastroparesis presents with complaint of nausea and vomiting.  She reports significant nausea and vomiting beginning overnight.  She is unable to take her home medications.  She complains of diffuse abdominal pain as well.  Symptoms today are consistent with prior presentations of gastroparesis exacerbation.  The history is provided by the patient.  Emesis Severity:  Severe Duration:  12 hours Timing:  Constant      Home Medications Prior to Admission medications   Medication Sig Start Date End Date Taking? Authorizing Provider  albuterol (VENTOLIN HFA) 108 (90 Base) MCG/ACT inhaler Inhale 2 puffs into the lungs every 6 (six) hours as needed for shortness of breath.    [provider]  atorvastatin (LIPITOR) 20 MG tablet Take 1 tablet (20 mg total) by mouth daily. 04/10/21   [name], MD  blood glucose meter kit and supplies Dispense based on patient and insurance preference. Use up to four times daily as directed. (FOR ICD-10 E10.9, E11.9). 06/14/21   Shelly Coss, MD  blood glucose meter kit and supplies Dispense based on patient and insurance preference. Use up to four times daily as directed. (FOR ICD-10 E10.9, E11.9). 07/31/21   Georgette Shell, MD  carvedilol (COREG) 6.25 MG tablet Take 1 tablet (6.25 mg total) by mouth 2 (two) times daily with a meal. 07/01/21   Nita Sells, MD  Continuous Blood Gluc Sensor (DEXCOM G6 SENSOR) MISC 1 each by Does not apply route daily. 11/21/20   [name], MD  Continuous Blood Gluc Transmit (DEXCOM G6 TRANSMITTER) MISC USE AS DIRECTED 09/16/21   [name], MD  dicyclomine (BENTYL) 20 MG tablet Take 1 tablet (20 mg total) by mouth  3 (three) times daily as needed for spasms. 07/31/21   Georgette Shell, MD  glucose blood (TRUE METRIX BLOOD GLUCOSE TEST) test strip Use as instructed Patient taking differently: 1 each by Other route See admin instructions. Use as instructed 09/03/20   [name], MD  hydrOXYzine (VISTARIL) 25 MG capsule Take 1 capsule (25 mg total) by mouth every 8 (eight) hours as needed. Patient taking differently: Take 25 mg by mouth every 8 (eight) hours as needed for anxiety. 07/31/21   Georgette Shell, MD  insulin detemir (LEVEMIR FLEXTOUCH) 100 UNIT/ML FlexPen Inject 16 Units into the skin daily. Patient taking differently: Inject 18 Units into the skin daily. 06/14/21   Shelly Coss, MD  Insulin Pen Needle (PEN NEEDLES 3/16") 31G X 5 MM MISC 1 each by Does not apply route in the morning, at noon, and at bedtime. 06/14/21   Shelly Coss, MD  insulin regular (NOVOLIN R) 100 units/mL injection Inject 0-0.15 mLs (0-15 Units total) into the skin See admin instructions. Via pump.Start this medication after you follow up with your PCP Patient taking differently: Inject 0-15 Units into the skin See admin instructions. Via pump. 06/14/21   Shelly Coss, MD  ondansetron (ZOFRAN) 4 MG tablet Take 1 tablet (4 mg total) by mouth every 8 (eight) hours as needed for nausea or vomiting. 08/27/21 08/27/22  Caren Griffins, MD  pantoprazole (PROTONIX) 40 MG tablet Take 1 tablet (40 mg total) by mouth 2 (two) times daily before a meal.  08/19/21   Danford, Suann Larry, MD  prochlorperazine (COMPAZINE) 10 MG tablet Take 1/2-1 tablet (5-10 mg total) by mouth every 8 (eight) hours as needed for nausea or vomiting. 08/27/21   Caren Griffins, MD  TRUEPLUS LANCETS 28G MISC assist with checking blood sugar TID and qhs Patient taking differently: 1 each by Other route See admin instructions. assist with checking blood sugar TID and qhs 09/10/15   Zettie Pho S, PA-C  insulin aspart (NOVOLOG) 100 UNIT/ML FlexPen  Inject 5 Units into the skin 3 (three) times daily with meals. 02/23/18 07/05/19  Donne Hazel, MD      Allergies    Patient has no known allergies.    Review of Systems   Review of Systems  Gastrointestinal:  Positive for vomiting.  All other systems reviewed and are negative.   Physical Exam Updated Vital Signs BP (!) 182/129   Pulse (!) 121   Temp 99.1 F (37.3 C) (Oral)   Resp 17   SpO2 100%  Physical Exam Vitals and nursing note reviewed.  Constitutional:      General: She is not in acute distress.    Appearance: She is well-developed.     Comments: Actively retching, appears uncomfortable  HENT:     Head: Normocephalic and atraumatic.  Eyes:     Conjunctiva/sclera: Conjunctivae normal.     Pupils: Pupils are equal, round, and reactive to light.  Cardiovascular:     Rate and Rhythm: Normal rate and regular rhythm.     Heart sounds: Normal heart sounds.  Pulmonary:     Effort: Pulmonary effort is normal. No respiratory distress.     Breath sounds: Normal breath sounds.  Abdominal:     General: There is no distension.     Palpations: Abdomen is soft.     Tenderness: There is no abdominal tenderness.  Musculoskeletal:        General: No deformity. Normal range of motion.     Cervical back: Normal range of motion and neck supple.  Skin:    General: Skin is warm and dry.  Neurological:     General: No focal deficit present.     Mental Status: She is alert and oriented to person, place, and time.     ED Results / Procedures / Treatments   Labs (all labs ordered are listed, but only abnormal results are displayed) Labs Reviewed  COMPREHENSIVE METABOLIC PANEL - Abnormal; Notable for the following components:      Result Value   Sodium 134 (*)    Glucose, Bld 374 (*)    BUN 24 (*)    Creatinine, Ser 1.23 (*)    Total Protein 8.4 (*)    Total Bilirubin 1.3 (*)    All other components within normal limits  CBC - Abnormal; Notable for the following  components:   Platelets 472 (*)    All other components within normal limits  URINALYSIS, ROUTINE W REFLEX MICROSCOPIC - Abnormal; Notable for the following components:   Glucose, UA >=500 (*)    Hgb urine dipstick SMALL (*)    Ketones, ur 5 (*)    Protein, ur >=300 (*)    Bacteria, UA RARE (*)    All other components within normal limits  CBG MONITORING, ED - Abnormal; Notable for the following components:   Glucose-Capillary 359 (*)    All other components within normal limits  CULTURE, BLOOD (ROUTINE X 2)  CULTURE, BLOOD (ROUTINE X 2)  LIPASE, BLOOD  I-STAT BETA HCG BLOOD, ED ([facility], [facility], [facility] ONLY)    EKG EKG Interpretation  Date/Time:  Friday October 04 2021 12:10:04 EDT Ventricular Rate:  119 PR Interval:  180 QRS Duration: 73 QT Interval:  296 QTC Calculation: 417 R Axis:   74 Text Interpretation: Sinus tachycardia Biatrial enlargement Abnormal Q suggests anterior infarct Confirmed by Dene Gentry [phone]) on 10/04/2021 12:24:52 PM  Radiology No results found.  Procedures Procedures    Medications Ordered in ED Medications  insulin detemir (LEVEMIR) injection 16 Units (has no administration in time range)  ondansetron (ZOFRAN) injection 4 mg (4 mg Intravenous Given 10/04/21 1224)  sodium chloride 0.9 % bolus 1,000 mL (0 mLs Intravenous Stopped 10/04/21 1335)  HYDROmorphone (DILAUDID) injection 1 mg (1 mg Intravenous Given 10/04/21 1225)  metoCLOPramide (REGLAN) injection 10 mg (10 mg Intravenous Given 10/04/21 1225)  HYDROmorphone (DILAUDID) injection 1 mg (1 mg Intravenous Given 10/04/21 1339)    ED Course/ Medical Decision Making/ A&P                           Medical Decision Making Amount and/or Complexity of Data Reviewed Labs: ordered.  Risk Prescription drug management. Decision regarding hospitalization.    Medical Screen Complete  This patient presented to the ED with complaint of nausea and vomiting.  This complaint involves an extensive number of  treatment options. The initial differential diagnosis includes, but is not limited to, gastroparesis exacerbation, dehydration, DKA, AKI, metabolic abnormality, etc.  This presentation is: Acute, Chronic, Self-Limited, Previously Undiagnosed, Uncertain Prognosis, Complicated, Systemic Symptoms, and Threat to Life/Bodily Function  Patient with longstanding history of gastroparesis presents with nausea and vomiting.  Patient reports that symptoms are significant over the last 12 hours.  Screening labs obtained are notable for mildly increased BUN and creatinine.  Patient's glucose is 374 with a gap of 10.  Patient is unable to tolerate p.o.  She would benefit from admission.  Hospitalist service made aware of case and will evaluate for admission.   Additional history obtained:  External records from outside sources obtained and reviewed including prior ED visits and prior Inpatient records.    Lab Tests:  I ordered and personally interpreted labs.  The pertinent results include: CBC, CMP, lipase, hCG, UA  NSR Cardiac Monitoring:  The patient was maintained on a cardiac monitor.  I personally viewed and interpreted the cardiac monitor which showed an underlying rhythm of: NSR   Medicines ordered:  I ordered medication including Dilaudid, IV fluids, Reglan, Zofran, insulin for pain, dehydration, nausea, hyperglycemia Reevaluation of the patient after these medicines showed that the patient: improved  .    Problem List / ED Course:  Nausea, vomiting, dehydration, hyperglycemia   Reevaluation:  After the interventions noted above, I reevaluated the patient and found that they have: improved   Disposition:  After consideration of the diagnostic results and the patients response to treatment, I feel that the patent would benefit from admission.          Final Clinical Impression(s) / ED Diagnoses Final diagnoses:  Intractable vomiting    Rx / DC Orders ED  Discharge Orders     None         Valarie Merino, MD 10/04/21 1358

## 2021-10-04 NOTE — ED Triage Notes (Signed)
Pt arrived via POV, c/o vomiting, hx of gastroparesis. Admitted recently for same.

## 2021-10-04 NOTE — Inpatient Diabetes Management (Addendum)
Inpatient Diabetes Program Recommendations  AACE/ADA: New Consensus Statement on Inpatient Glycemic Control (2015)  Target Ranges:  Prepandial:   less than 140 mg/dL      Peak postprandial:   less than 180 mg/dL (1-2 hours)      Critically ill patients:  140 - 180 mg/dL   Lab Results  Component Value Date   GLUCAP 359 (H) 10/04/2021   HGBA1C 10.9 (H) 09/21/2021    Review of Glycemic Control  Diabetes history: DM1 Outpatient Diabetes medications: Levemir 16 QD, Novolin R 0-15 TID Current orders for Inpatient glycemic control: Levemir 16 untis QD  HgbA1C - 10.9% CBG 359, 374  NPO Not in DKA.  Inpatient Diabetes Program Recommendations:    Add Novolog 0-15 units Q4H  Follow.  Thank you. Ailene Ards, RD, LDN, CDCES Inpatient Diabetes Coordinator [phone]

## 2021-10-05 DIAGNOSIS — Z8041 Family history of malignant neoplasm of ovary: Secondary | ICD-10-CM | POA: Diagnosis not present

## 2021-10-05 DIAGNOSIS — Z794 Long term (current) use of insulin: Secondary | ICD-10-CM | POA: Diagnosis not present

## 2021-10-05 DIAGNOSIS — R112 Nausea with vomiting, unspecified: Secondary | ICD-10-CM | POA: Diagnosis present

## 2021-10-05 DIAGNOSIS — E1043 Type 1 diabetes mellitus with diabetic autonomic (poly)neuropathy: Secondary | ICD-10-CM | POA: Diagnosis present

## 2021-10-05 DIAGNOSIS — I10 Essential (primary) hypertension: Secondary | ICD-10-CM

## 2021-10-05 DIAGNOSIS — E785 Hyperlipidemia, unspecified: Secondary | ICD-10-CM | POA: Diagnosis not present

## 2021-10-05 DIAGNOSIS — K219 Gastro-esophageal reflux disease without esophagitis: Secondary | ICD-10-CM | POA: Diagnosis present

## 2021-10-05 DIAGNOSIS — I152 Hypertension secondary to endocrine disorders: Secondary | ICD-10-CM | POA: Diagnosis present

## 2021-10-05 DIAGNOSIS — E1065 Type 1 diabetes mellitus with hyperglycemia: Secondary | ICD-10-CM | POA: Diagnosis not present

## 2021-10-05 DIAGNOSIS — Z800 Family history of malignant neoplasm of digestive organs: Secondary | ICD-10-CM | POA: Diagnosis not present

## 2021-10-05 DIAGNOSIS — K3184 Gastroparesis: Secondary | ICD-10-CM

## 2021-10-05 DIAGNOSIS — Z833 Family history of diabetes mellitus: Secondary | ICD-10-CM | POA: Diagnosis not present

## 2021-10-05 DIAGNOSIS — Z803 Family history of malignant neoplasm of breast: Secondary | ICD-10-CM | POA: Diagnosis not present

## 2021-10-05 DIAGNOSIS — R1110 Vomiting, unspecified: Secondary | ICD-10-CM | POA: Diagnosis present

## 2021-10-05 DIAGNOSIS — Z79899 Other long term (current) drug therapy: Secondary | ICD-10-CM | POA: Diagnosis not present

## 2021-10-05 DIAGNOSIS — N179 Acute kidney failure, unspecified: Secondary | ICD-10-CM

## 2021-10-05 DIAGNOSIS — E1143 Type 2 diabetes mellitus with diabetic autonomic (poly)neuropathy: Secondary | ICD-10-CM | POA: Diagnosis not present

## 2021-10-05 DIAGNOSIS — E1069 Type 1 diabetes mellitus with other specified complication: Secondary | ICD-10-CM | POA: Diagnosis present

## 2021-10-05 DIAGNOSIS — Z801 Family history of malignant neoplasm of trachea, bronchus and lung: Secondary | ICD-10-CM | POA: Diagnosis not present

## 2021-10-05 DIAGNOSIS — Z818 Family history of other mental and behavioral disorders: Secondary | ICD-10-CM | POA: Diagnosis not present

## 2021-10-05 LAB — CBC
HCT: 33.8 % — ABNORMAL LOW (ref 36.0–46.0)
Hemoglobin: 10.8 g/dL — ABNORMAL LOW (ref 12.0–15.0)
MCH: 29 pg (ref 26.0–34.0)
MCHC: 32 g/dL (ref 30.0–36.0)
MCV: 90.6 fL (ref 80.0–100.0)
Platelets: 351 10*3/uL (ref 150–400)
RBC: 3.73 MIL/uL — ABNORMAL LOW (ref 3.87–5.11)
RDW: 13.5 % (ref 11.5–15.5)
WBC: 9.9 10*3/uL (ref 4.0–10.5)
nRBC: 0.2 % (ref 0.0–0.2)

## 2021-10-05 LAB — RAPID URINE DRUG SCREEN, HOSP PERFORMED
Amphetamines: NOT DETECTED
Barbiturates: NOT DETECTED
Benzodiazepines: NOT DETECTED
Cocaine: NOT DETECTED
Opiates: NOT DETECTED
Tetrahydrocannabinol: NOT DETECTED

## 2021-10-05 LAB — GLUCOSE, CAPILLARY
Glucose-Capillary: 189 mg/dL — ABNORMAL HIGH (ref 70–99)
Glucose-Capillary: 186 mg/dL — ABNORMAL HIGH (ref 70–99)
Glucose-Capillary: 150 mg/dL — ABNORMAL HIGH (ref 70–99)
Glucose-Capillary: 96 mg/dL (ref 70–99)

## 2021-10-05 LAB — COMPREHENSIVE METABOLIC PANEL
ALT: 16 U/L (ref 0–44)
AST: 16 U/L (ref 15–41)
Albumin: 3 g/dL — ABNORMAL LOW (ref 3.5–5.0)
Alkaline Phosphatase: 65 U/L (ref 38–126)
Anion gap: 9 (ref 5–15)
BUN: 24 mg/dL — ABNORMAL HIGH (ref 6–20)
CO2: 23 mmol/L (ref 22–32)
Calcium: 9.2 mg/dL (ref 8.9–10.3)
Chloride: 106 mmol/L (ref 98–111)
Creatinine, Ser: 1.16 mg/dL — ABNORMAL HIGH (ref 0.44–1.00)
GFR, Estimated: >60 mL/min (ref >60)
Glucose, Bld: 181 mg/dL — ABNORMAL HIGH (ref 70–99)
Potassium: 3.7 mmol/L (ref 3.5–5.1)
Sodium: 138 mmol/L (ref 135–145)
Total Bilirubin: 0.9 mg/dL (ref 0.3–1.2)
Total Protein: 6.6 g/dL (ref 6.5–8.1)

## 2021-10-05 MED ORDER — KETOROLAC TROMETHAMINE 30 MG/ML IJ SOLN: 30.0000 mg | Freq: Four times a day (QID) | INTRAMUSCULAR | Status: DC | PRN

## 2021-10-05 MED ORDER — HYDROMORPHONE HCL 2 MG PO TABS
2.0000 mg | ORAL_TABLET | Freq: Once | ORAL | Status: AC
  Administered 2021-10-05: 2 mg via ORAL
  Filled 2021-10-05: qty 1

## 2021-10-05 MED ORDER — METOCLOPRAMIDE HCL 5 MG/ML IJ SOLN
10.0000 mg | Freq: Four times a day (QID) | INTRAMUSCULAR | Status: DC
  Administered 2021-10-05 – 2021-10-07 (×7): 10 mg via INTRAVENOUS
  Filled 2021-10-05 (×8): qty 2

## 2021-10-05 MED ORDER — ERYTHROMYCIN LACTOBIONATE 500 MG IV SOLR
250.0000 mg | Freq: Three times a day (TID) | INTRAVENOUS | Status: DC
  Administered 2021-10-05 – 2021-10-07 (×7): 250 mg via INTRAVENOUS
  Filled 2021-10-05 (×13): qty 5

## 2021-10-05 MED ORDER — HYDROMORPHONE HCL 1 MG/ML IJ SOLN
0.5000 mg | INTRAMUSCULAR | Status: DC | PRN
  Administered 2021-10-05 – 2021-10-07 (×12): 0.5 mg via INTRAVENOUS
  Filled 2021-10-05 (×12): qty 0.5

## 2021-10-05 NOTE — Progress Notes (Signed)
PROGRESS NOTE    Kelli Hudson  DUK:[id number] DOB: 12-13-1990 DOA: 10/04/2021 PCP: Loyola Mast, MD    Brief Narrative:  31 year old female with a history of type 1 diabetes, diabetic gastroparesis, recurrent admissions for nausea, vomiting and abdominal pain.  Readmitted to the hospital with abdominal symptoms including vomiting and abdominal pain.   Assessment & Plan:   Active Problems:   Diabetic gastroparesis (HCC)   Hypertension associated with diabetes (HCC)   Intractable nausea and vomiting   Essential hypertension   GERD (gastroesophageal reflux disease)   AKI (acute kidney injury) (HCC)   DM (diabetes mellitus), type 1 (HCC)   Dyslipidemia   Intractable nausea and vomiting Abdominal pain Diabetic gastroparesis -Continued symptoms of nausea, vomiting and abdominal pain -Unable to tolerate p.o. at this time, continue clear liquids  female advance diet as tolerated -Continue Protonix twice daily -Continue Compazine as needed -Discussed pain management and the role of opiates -Explained that opiates may provide temporary relief of her pain, but are worsening her gastroparesis amongst other long-term adverse effects -Due to her severe pain, will try low-dose Dilaudid for now since this has been what she has received during prior hospitalizations.  Advised her to minimize narcotic use as much as possible -Continue established with [facility] for outpatient follow-up -Continue on IV Reglan -Start IV erythromycin -Check UDS  Type 1 diabetes -She is continued on basal insulin with sliding scale -Check A1c  GERD -Continue PPI  AKI -Baseline creatinine 0.9 -Admission creatinine of 1.2 -Continue IV hydration -renal ultrasound unremarkable  Hypertension -Continued on carvedilol  Hyperlipidemia -Continue on statin   DVT prophylaxis: enoxaparin (LOVENOX) injection 40 mg Start: 10/05/21 1000  Code Status: full code Family Communication: no family  present Disposition Plan: Status is: Observation The patient will require care spanning > 2 midnights and should be moved to inpatient because: continued nausea and vomiting and poor po intake     Consultants:    Procedures:    Antimicrobials:      Subjective: Reports continued abdominal pain. She vomited her breakfast this morning. Says the only thing that helps her pain is IV dilaudid  Objective: Vitals:   10/04/21 2225 10/04/21 2233 10/05/21 0218 10/05/21 0626  BP: 130/81  128/79 (!) 154/95  Pulse: (!) 117  (!) 103 (!) 102  Resp: 20  20 20   Temp: 99 F (37.2 C)  98.5 F (36.9 C) 99 F (37.2 C)  TempSrc: Oral  Oral Oral  SpO2: 100%  100% 100%  Weight:  79.9 kg    Height:  5\' 8"  (1.727 m)      Intake/Output Summary (Last 24 hours) at 10/05/2021 1002 Last data filed at 10/04/2021 1335 Gross per 24 hour  Intake 1000 ml  Output --  Net 1000 ml   Filed Weights   10/04/21 2233  Weight: 79.9 kg    Examination:  General exam: Appears calm and comfortable  Respiratory system: Clear to auscultation. Respiratory effort normal. Cardiovascular system: S1 & S2 heard, mildly tachycardic. No JVD, murmurs, rubs, gallops or clicks. No pedal edema. Gastrointestinal system: Abdomen is nondistended, soft and diffusely tender to minimal touch. No organomegaly or masses felt. Normal bowel sounds heard. Central nervous system: Alert and oriented. No focal neurological deficits. Extremities: Symmetric 5 x 5 power. Skin: No rashes, lesions or ulcers Psychiatry: Judgement and insight appear normal. Mood & affect appropriate.     Data Reviewed: I have personally reviewed following labs and imaging studies  CBC: Recent Labs  Lab 10/04/21 1227 10/05/21 0519  WBC 9.6 9.9  HGB 12.6 10.8*  HCT 38.3 33.8*  MCV 87.4 90.6  PLT 472* [id number]   Basic Metabolic Panel: Recent Labs  Lab 10/04/21 1227 10/05/21 0519  NA 134* 138  K 3.8 3.7  CL 100 106  CO2 24 23  GLUCOSE 374* 181*   BUN 24* 24*  CREATININE 1.23* 1.16*  CALCIUM 9.8 9.2   GFR: Estimated Creatinine Clearance: 78.7 mL/min (A) (by C-G formula based on SCr of 1.16 mg/dL (H)). Liver Function Tests: Recent Labs  Lab 10/04/21 1227 10/05/21 0519  AST 22 16  ALT 22 16  ALKPHOS 92 65  BILITOT 1.3* 0.9  PROT 8.4* 6.6  ALBUMIN 3.8 3.0*   Recent Labs  Lab 10/04/21 1227  LIPASE 24   No results for input(s): "AMMONIA" in the last 168 hours. Coagulation Profile: No results for input(s): "INR", "PROTIME" in the last 168 hours. Cardiac Enzymes: No results for input(s): "CKTOTAL", "CKMB", "CKMBINDEX", "TROPONINI" in the last 168 hours. BNP (last 3 results) No results for input(s): "PROBNP" in the last 8760 hours. HbA1C: No results for input(s): "HGBA1C" in the last 72 hours. CBG: Recent Labs  Lab 10/04/21 1205 10/04/21 2246 10/05/21 0754  GLUCAP 359* 247* 189*   Lipid Profile: No results for input(s): "CHOL", "HDL", "LDLCALC", "TRIG", "CHOLHDL", "LDLDIRECT" in the last 72 hours. Thyroid Function Tests: No results for input(s): "TSH", "T4TOTAL", "FREET4", "T3FREE", "THYROIDAB" in the last 72 hours. Anemia Panel: No results for input(s): "VITAMINB12", "FOLATE", "FERRITIN", "TIBC", "IRON", "RETICCTPCT" in the last 72 hours. Sepsis Labs: No results for input(s): "PROCALCITON", "LATICACIDVEN" in the last 168 hours.  Recent Results (from the past 240 hour(s))  Culture, blood (routine x 2)     Status: None (Preliminary result)   Collection Time: 10/04/21 12:27 PM   Specimen: BLOOD  Result Value Ref Range Status   Specimen Description   Final    BLOOD BLOOD RIGHT ARM Performed at [facility] [address] [place], [place] [address]    Special Requests   Final    BOTTLES DRAWN AEROBIC AND ANAEROBIC Blood Culture results may not be optimal due to an excessive volume of blood received in culture bottles Performed at [facility] [address],  [place], [place] [address]    Culture   Final    NO GROWTH < 24 HOURS Performed at [facility] [address], [place],  [address]    Report Status PENDING  Incomplete         Radiology Studies: US RENAL  Result Date: 10/04/2021 CLINICAL DATA:  Acute kidney injury, diabetes, hypertension EXAM: RENAL / URINARY TRACT ULTRASOUND COMPLETE COMPARISON:  CT 08/24/2021 FINDINGS: Right Kidney: Renal measurements: 10.6 x 4.2 x 4.7 = volume: 110 mL. Echogenicity within normal limits. No mass or hydronephrosis visualized. Left Kidney: Renal measurements: 11.2 x 5.2 x 5.7 = volume: 173 mL. Echogenicity within normal limits. No mass or hydronephrosis visualized. Bladder: Appears normal for degree of bladder distention. Other: None. IMPRESSION: Negative.  No hydronephrosis. Electronically Signed   By: Lucrezia Europe M.D.   On: 10/04/2021 16:26   DG Abd 1 View  Result Date: 10/04/2021 CLINICAL DATA:  Vomiting with history of gastroparesis. EXAM: ABDOMEN - 1 VIEW COMPARISON:  09/08/2020 FINDINGS: Bowel gas pattern is nonobstructive. No free peritoneal air. 2 cylinder shaped metallic densities projecting over the left pelvis and left lower quadrant of uncertain clinical significance as these may be  exterior to the patient. Left pelvic phleboliths. Remaining bony structures are unremarkable. IMPRESSION: Nonobstructive bowel gas pattern. Electronically Signed   By: Elberta Fortis M.D.   On: 10/04/2021 14:59        Scheduled Meds:  atorvastatin  20 mg Oral Daily   carvedilol  6.25 mg Oral BID WC   docusate sodium  100 mg Oral BID   enoxaparin (LOVENOX) injection  40 mg Subcutaneous Q24H   insulin aspart  0-15 Units Subcutaneous TID WC   insulin aspart  0-5 Units Subcutaneous QHS   insulin detemir  16 Units Subcutaneous Daily   metoCLOPramide (REGLAN) injection  10 mg Intravenous Q6H   pantoprazole (PROTONIX) IV  40 mg Intravenous Q24H   Continuous Infusions:  sodium chloride 75 mL/hr at 10/04/21  2244   erythromycin       LOS: 0 days    Time spent:    Erick Blinks, MD [facility]   If 7PM-7AM, please contact night-coverage [email or web address]  10/05/2021, 10:02 AM

## 2021-10-06 LAB — HEMOGLOBIN A1C
Hgb A1c MFr Bld: 10.3 % — ABNORMAL HIGH (ref 4.8–5.6)
Mean Plasma Glucose: 248.91 mg/dL

## 2021-10-06 LAB — GLUCOSE, CAPILLARY
Glucose-Capillary: 148 mg/dL — ABNORMAL HIGH (ref 70–99)
Glucose-Capillary: 142 mg/dL — ABNORMAL HIGH (ref 70–99)
Glucose-Capillary: 264 mg/dL — ABNORMAL HIGH (ref 70–99)
Glucose-Capillary: 100 mg/dL — ABNORMAL HIGH (ref 70–99)

## 2021-10-06 LAB — BASIC METABOLIC PANEL
Anion gap: 5 (ref 5–15)
BUN: 14 mg/dL (ref 6–20)
CO2: 24 mmol/L (ref 22–32)
Calcium: 9.1 mg/dL (ref 8.9–10.3)
Chloride: 106 mmol/L (ref 98–111)
Creatinine, Ser: 0.95 mg/dL (ref 0.44–1.00)
GFR, Estimated: >60 mL/min (ref >60)
Glucose, Bld: 133 mg/dL — ABNORMAL HIGH (ref 70–99)
Potassium: 4.1 mmol/L (ref 3.5–5.1)
Sodium: 135 mmol/L (ref 135–145)

## 2021-10-06 NOTE — Progress Notes (Signed)
PROGRESS NOTE    Kelli Hudson  HYQ:[id number] DOB: February 12, 1991 DOA: 10/04/2021 PCP: Loyola Mast, MD    Brief Narrative:  31 year old female with a history of type 1 diabetes, diabetic gastroparesis, recurrent admissions for nausea, vomiting and abdominal pain.  Readmitted to the hospital with abdominal symptoms including vomiting and abdominal pain.   Assessment & Plan:   Active Problems:   Diabetic gastroparesis (HCC)   Hypertension associated with diabetes (HCC)   Intractable nausea and vomiting   Essential hypertension   GERD (gastroesophageal reflux disease)   AKI (acute kidney injury) (HCC)   DM (diabetes mellitus), type 1 (HCC)   Dyslipidemia   Nausea and vomiting   Intractable nausea and vomiting Abdominal pain Diabetic gastroparesis -Symptoms slowly improving -currently on clear liquid diet, will advance to full liquids today -Continue Protonix daily -Continue Compazine as needed -Discussed pain management and the role of opiates -Explained that opiates may provide temporary relief of her pain, but are worsening her gastroparesis amongst other long-term adverse effects -Due to her severe pain, will try low-dose Dilaudid for now since this has been what she has received during prior hospitalizations.  Advised her to minimize narcotic use as much as possible -Patient has established with  GI for outpatient follow-up -Continue on IV Reglan -Continue IV erythromycin -UDS negative  Type 1 diabetes -She is continued on basal insulin with sliding scale -Blood sugars appear stable -A1c in process  GERD -Continue PPI  AKI -Baseline creatinine 0.9 -Admission creatinine of 1.2 -Continue IV hydration -renal ultrasound unremarkable -Creatinine improved to 0.9 with IV fluids  Hypertension -Continued on carvedilol  Hyperlipidemia -Continue on statin   DVT prophylaxis: enoxaparin (LOVENOX) injection 40 mg Start: 10/05/21 1000  Code Status:  full code Family Communication: no family present Disposition Plan: Status is: Inpatient The patient will require care spanning > 2 midnights and should be moved to inpatient because: continued nausea and vomiting and poor po intake     Consultants:    Procedures:    Antimicrobials:      Subjective: She says she feels little better today.  Still has some nausea, but no vomiting.  Abdominal pain is better.  Bowel movements are normal.  Objective: Vitals:   10/05/21 1100 10/05/21 1107 10/05/21 2053 10/06/21 0416  BP: 111/70 111/70 117/85 (!) 140/93  Pulse: 98 99 99 98  Resp: 18  18 18   Temp: 98.7 F (37.1 C)  98.2 F (36.8 C) 98.7 F (37.1 C)  TempSrc: Oral  Oral Oral  SpO2: 98% 97% 100% 98%  Weight:      Height:        Intake/Output Summary (Last 24 hours) at 10/06/2021 0905 Last data filed at 10/06/2021 0504 Gross per 24 hour  Intake 3100 ml  Output --  Net 3100 ml   Filed Weights   10/04/21 2233  Weight: 79.9 kg    Examination:  General exam: Appears calm and comfortable  Respiratory system: Clear to auscultation. Respiratory effort normal. Cardiovascular system: S1 & S2 heard, mildly tachycardic. No JVD, murmurs, rubs, gallops or clicks. No pedal edema. Gastrointestinal system: Abdomen is nondistended, soft and diffusely tender to minimal touch. No organomegaly or masses felt. Normal bowel sounds heard. Central nervous system: Alert and oriented. No focal neurological deficits. Extremities: Symmetric 5 x 5 power. Skin: No rashes, lesions or ulcers Psychiatry: Judgement and insight appear normal. Mood & affect appropriate.     Data Reviewed: I have personally reviewed following labs and imaging studies  CBC: Recent Labs  Lab 10/04/21 1227 10/05/21 0519  WBC 9.6 9.9  HGB 12.6 10.8*  HCT 38.3 33.8*  MCV 87.4 90.6  PLT 472* 351   Basic Metabolic Panel: Recent Labs  Lab 10/04/21 1227 10/05/21 0519 10/06/21 0447  NA 134* 138 135  K 3.8 3.7  4.1  CL 100 106 106  CO2 24 23 24   GLUCOSE 374* 181* 133*  BUN 24* 24* 14  CREATININE 1.23* 1.16* 0.95  CALCIUM 9.8 9.2 9.1   GFR: Estimated Creatinine Clearance: 96.1 mL/min (by C-G formula based on SCr of 0.95 mg/dL). Liver Function Tests: Recent Labs  Lab 10/04/21 1227 10/05/21 0519  AST 22 16  ALT 22 16  ALKPHOS 92 65  BILITOT 1.3* 0.9  PROT 8.4* 6.6  ALBUMIN 3.8 3.0*   Recent Labs  Lab 10/04/21 1227  LIPASE 24   No results for input(s): "AMMONIA" in the last 168 hours. Coagulation Profile: No results for input(s): "INR", "PROTIME" in the last 168 hours. Cardiac Enzymes: No results for input(s): "CKTOTAL", "CKMB", "CKMBINDEX", "TROPONINI" in the last 168 hours. BNP (last 3 results) No results for input(s): "PROBNP" in the last 8760 hours. HbA1C: No results for input(s): "HGBA1C" in the last 72 hours. CBG: Recent Labs  Lab 10/05/21 0754 10/05/21 1209 10/05/21 1628 10/05/21 2155 10/06/21 0745  GLUCAP 189* 186* 150* 96 148*   Lipid Profile: No results for input(s): "CHOL", "HDL", "LDLCALC", "TRIG", "CHOLHDL", "LDLDIRECT" in the last 72 hours. Thyroid Function Tests: No results for input(s): "TSH", "T4TOTAL", "FREET4", "T3FREE", "THYROIDAB" in the last 72 hours. Anemia Panel: No results for input(s): "VITAMINB12", "FOLATE", "FERRITIN", "TIBC", "IRON", "RETICCTPCT" in the last 72 hours. Sepsis Labs: No results for input(s): "PROCALCITON", "LATICACIDVEN" in the last 168 hours.  Recent Results (from the past 240 hour(s))  Culture, blood (routine x 2)     Status: None (Preliminary result)   Collection Time: 10/04/21 12:27 PM   Specimen: BLOOD  Result Value Ref Range Status   Specimen Description   Final    BLOOD BLOOD RIGHT ARM Performed at [facility], [address] [place], [place]    Special Requests   Final    BOTTLES DRAWN AEROBIC AND ANAEROBIC Blood Culture results may not be optimal due to an excessive volume of blood  received in culture bottles Performed at [facility], [address] [place], [place]    Culture   Final    NO GROWTH 2 DAYS Performed at [facility], [address] [place], [place]    Report Status PENDING  Incomplete  Culture, blood (routine x 2)     Status: None (Preliminary result)   Collection Time: 10/05/21  5:19 AM   Specimen: BLOOD  Result Value Ref Range Status   Specimen Description   Final    BLOOD BLOOD LEFT HAND Performed at [facility], [address], [place], [place]    Special Requests   Final    BOTTLES DRAWN AEROBIC ONLY Blood Culture adequate volume Performed at [facility], [address], [place], [place]    Culture   Final    NO GROWTH < 24 HOURS Performed at [facility], [address], [place], [place]    Report Status PENDING  Incomplete         Radiology Studies: [id number] RENAL  Result Date: 10/04/2021 CLINICAL DATA:  Acute kidney injury, diabetes,  hypertension EXAM: RENAL / URINARY TRACT ULTRASOUND COMPLETE COMPARISON:  CT 08/24/2021 FINDINGS: Right Kidney: Renal measurements: 10.6 x 4.2 x 4.7 = volume: 110 mL. Echogenicity within normal limits. No mass or hydronephrosis visualized. Left Kidney: Renal measurements: 11.2 x 5.2 x 5.7 = volume: 173 mL. Echogenicity within normal limits. No mass or hydronephrosis visualized. Bladder: Appears normal for degree of bladder distention. Other: None. IMPRESSION: Negative.  No hydronephrosis. Electronically Signed   By: Corlis Leak M.D.   On: 10/04/2021 16:26   DG Abd 1 View  Result Date: 10/04/2021 CLINICAL DATA:  Vomiting with history of gastroparesis. EXAM: ABDOMEN - 1 VIEW COMPARISON:  09/08/2020 FINDINGS: Bowel gas pattern is nonobstructive. No free peritoneal air. 2 cylinder shaped metallic densities projecting over the left pelvis and left lower quadrant of uncertain clinical  significance as these may be exterior to the patient. Left pelvic phleboliths. Remaining bony structures are unremarkable. IMPRESSION: Nonobstructive bowel gas pattern. Electronically Signed   By: Elberta Fortis M.D.   On: 10/04/2021 14:59        Scheduled Meds:  atorvastatin  20 mg Oral Daily   carvedilol  6.25 mg Oral BID WC   docusate sodium  100 mg Oral BID   enoxaparin (LOVENOX) injection  40 mg Subcutaneous Q24H   insulin aspart  0-15 Units Subcutaneous TID WC   insulin aspart  0-5 Units Subcutaneous QHS   insulin detemir  16 Units Subcutaneous Daily   metoCLOPramide (REGLAN) injection  10 mg Intravenous Q6H   pantoprazole (PROTONIX) IV  40 mg Intravenous Q24H   Continuous Infusions:  sodium chloride 75 mL/hr at 10/06/21 0416   erythromycin 250 mg (10/06/21 0743)     LOS: 1 day    Time spent:    Erick Blinks, MD [facility]   If 7PM-7AM, please contact night-coverage [email or web address]  10/06/2021, 9:05 AM

## 2021-10-07 ENCOUNTER — Other Ambulatory Visit: Payer: Self-pay | Admitting: Family Medicine

## 2021-10-07 DIAGNOSIS — E0865 Diabetes mellitus due to underlying condition with hyperglycemia: Secondary | ICD-10-CM

## 2021-10-07 LAB — GLUCOSE, CAPILLARY
Glucose-Capillary: 157 mg/dL — ABNORMAL HIGH (ref 70–99)
Glucose-Capillary: 172 mg/dL — ABNORMAL HIGH (ref 70–99)

## 2021-10-07 MED ORDER — ERYTHROMYCIN BASE 500 MG PO TABS: 500.0000 mg | ORAL_TABLET | Freq: Three times a day (TID) | ORAL | 0 refills | Status: DC

## 2021-10-07 MED ORDER — PROCHLORPERAZINE MALEATE 10 MG PO TABS: 5.0000 mg | ORAL_TABLET | Freq: Three times a day (TID) | ORAL | 0 refills | Status: DC | PRN

## 2021-10-07 MED ORDER — ORAL CARE MOUTH RINSE
15.0000 mL | OROMUCOSAL | Status: DC | PRN
Start: 2021-10-07 — End: 2021-10-07

## 2021-10-07 MED ORDER — METOCLOPRAMIDE HCL 10 MG PO TABS: 10.0000 mg | ORAL_TABLET | Freq: Three times a day (TID) | ORAL | 1 refills | Status: DC

## 2021-10-07 NOTE — TOC Transition Note (Signed)
Transition of Care [facility]) - CM/SW Discharge Note   Patient Details  Name: Kelli Hudson MRN: [id number] Date of Birth: 1990/12/08  Transition of Care [facility]) CM/SW Contact:  Golda Acre, RN Phone Number: 10/07/2021, 2:52 PM   Clinical Narrative:    Pt dcd to home with self care.  No toc needs present.   Final next level of care: Home/Self Care Barriers to Discharge: Barriers Resolved   Patient Goals and CMS Choice Patient states their goals for this hospitalization and ongoing recovery are:: to go home CMS Medicare.gov Compare Post Acute Care list provided to:: Patient Choice offered to / list presented to : Patient  Discharge Placement                       Discharge Plan and Services   Discharge Planning Services: CM Consult                                 Social Determinants of Health (SDOH) Interventions     Readmission Risk Interventions    08/27/2021   12:14 PM 06/11/2021    1:01 PM 05/29/2021    9:33 AM  Readmission Risk Prevention Plan  Transportation Screening Complete Complete Complete  Medication Review [facility]) Complete Complete   PCP or Specialist appointment within 3-5 days of discharge Complete Complete Complete  HRI or Home Care Consult Complete Complete Complete  SW Recovery Care/Counseling Consult Complete Complete Complete  Palliative Care Screening Not Applicable Not Applicable Not Applicable  Skilled Nursing Facility Not Applicable Not Applicable Not Applicable

## 2021-10-07 NOTE — Progress Notes (Signed)
PHARMACY - PHYSICIAN COMMUNICATION CRITICAL VALUE ALERT - BLOOD CULTURE IDENTIFICATION (BCID)  Kelli Hudson is an 31 y.o. female who presented to [facility] on 10/04/2021 with a chief complaint of vomiting, gastroparesis  Assessment:  + 1/3 bottles, aerobic gram positive rods probable contaminant  Name of physician (or Provider) Contacted: [facility], via [facility]  Current antibiotics: EES for GI motility  Changes to prescribed antibiotics recommended:  Rec no treatment- accepted  No results found for this or any previous visit.  Herby Abraham, Pharm.D 10/07/2021 8:24 AM

## 2021-10-07 NOTE — Discharge Summary (Signed)
Physician Discharge Summary  Kelli Hudson TKW:[id number] DOB: [birth year] DOA: 10/04/2021  PCP: Loyola Mast, MD  Admit date: 10/04/2021 Discharge date: 10/07/2021  Admitted From: Home Disposition: Home  Recommendations for Outpatient Follow-up:  Follow up with PCP in 1-2 weeks Please obtain BMP/CBC in one week Follow up with primary GI as previously scheduled     Discharge Condition: Stable CODE STATUS: Full code Diet recommendation: Carb modified  Brief/Interim Summary: 31 year old female with a history of type 1 diabetes, diabetic gastroparesis, recurrent admissions for nausea, vomiting and abdominal pain.  Readmitted to the hospital with abdominal symptoms including vomiting and abdominal pain.  Discharge Diagnoses:  Active Problems:   Diabetic gastroparesis (HCC)   Hypertension associated with diabetes (HCC)   Intractable nausea and vomiting   Essential hypertension   GERD (gastroesophageal reflux disease)   AKI (acute kidney injury) (HCC)   DM (diabetes mellitus), type 1 (HCC)   Dyslipidemia   Nausea and vomiting  Intractable nausea and vomiting Abdominal pain Diabetic gastroparesis -Symptoms slowly improving -Diet was advanced from clear liquids to solid foods.  She tolerated this well. -Continue Protonix daily -Continue Compazine as needed -Discussed pain management and the role of opiates -Explained that opiates may provide temporary relief of her pain, but are worsening her gastroparesis amongst other long-term adverse effects -Due to her severe pain, she was tried on low-dose Dilaudid she since this has been what she has received during prior hospitalizations.  Advised her to minimize narcotic use as much as possible -Patient has established with [facility] for outpatient follow-up -Treated with IV Reglan and IV erythromycin -UDS negative -Overall symptoms improved and her diet was advanced, she is no longer having vomiting -We will discharge with  Reglan and antiemetics   Type 1 diabetes -She is continued on basal insulin with sliding scale -Blood sugars appear stable -A1c 10.3   GERD -Continue PPI   AKI -Baseline creatinine 0.9 -Admission creatinine of 1.2 -Continue IV hydration -renal ultrasound unremarkable -Creatinine improved to 0.9 with IV fluids   Hypertension -Continued on carvedilol   Hyperlipidemia -Continue on statin  Discharge Instructions  Discharge Instructions     Diet - low sodium heart healthy   Complete by: As directed    Increase activity slowly   Complete by: As directed       Allergies as of 10/07/2021   No Known Allergies      Medication List     STOP taking these medications    dicyclomine 20 MG tablet Commonly known as: BENTYL       TAKE these medications    atorvastatin 20 MG tablet Commonly known as: LIPITOR Take 1 tablet (20 mg total) by mouth daily.   carvedilol 6.25 MG tablet Commonly known as: COREG Take 1 tablet (6.25 mg total) by mouth 2 (two) times daily with a meal.   erythromycin base 500 MG tablet Commonly known as: E-MYCIN Take 1 tablet (500 mg total) by mouth 3 (three) times daily for 10 days.   hydrOXYzine 25 MG capsule Commonly known as: VISTARIL Take 1 capsule (25 mg total) by mouth every 8 (eight) hours as needed. What changed: reasons to take this   insulin regular 100 units/mL injection Commonly known as: NovoLIN R Inject 0-0.15 mLs (0-15 Units total) into the skin See admin instructions. Via pump.Start this medication after you follow up with your PCP What changed: additional instructions   Levemir FlexTouch 100 UNIT/ML FlexPen Generic drug: insulin detemir Inject 16 Units into the skin  daily.   metoCLOPramide 10 MG tablet Commonly known as: REGLAN Take 1 tablet (10 mg total) by mouth 3 (three) times daily with meals.   ondansetron 4 MG tablet Commonly known as: Zofran Take 1 tablet (4 mg total) by mouth every 8 (eight) hours as  needed for nausea or vomiting.   pantoprazole 40 MG tablet Commonly known as: PROTONIX Take 1 tablet (40 mg total) by mouth 2 (two) times daily before a meal.   prochlorperazine 10 MG tablet Commonly known as: COMPAZINE Take 1/2-1 tablet (5-10 mg total) by mouth every 8 (eight) hours as needed for nausea or vomiting.        No Known Allergies  Consultations:    Procedures/Studies: US RENAL  Result Date: 10/04/2021 CLINICAL DATA:  Acute kidney injury, diabetes, hypertension EXAM: RENAL / URINARY TRACT ULTRASOUND COMPLETE COMPARISON:  CT 08/24/2021 FINDINGS: Right Kidney: Renal measurements: 10.6 x 4.2 x 4.7 = volume: 110 mL. Echogenicity within normal limits. No mass or hydronephrosis visualized. Left Kidney: Renal measurements: 11.2 x 5.2 x 5.7 = volume: 173 mL. Echogenicity within normal limits. No mass or hydronephrosis visualized. Bladder: Appears normal for degree of bladder distention. Other: None. IMPRESSION: Negative.  No hydronephrosis. Electronically Signed   By: Corlis Leak M.D.   On: 10/04/2021 16:26   DG Abd 1 View  Result Date: 10/04/2021 CLINICAL DATA:  Vomiting with history of gastroparesis. EXAM: ABDOMEN - 1 VIEW COMPARISON:  09/08/2020 FINDINGS: Bowel gas pattern is nonobstructive. No free peritoneal air. 2 cylinder shaped metallic densities projecting over the left pelvis and left lower quadrant of uncertain clinical significance as these may be exterior to the patient. Left pelvic phleboliths. Remaining bony structures are unremarkable. IMPRESSION: Nonobstructive bowel gas pattern. Electronically Signed   By: Elberta Fortis M.D.   On: 10/04/2021 14:59      Subjective: Feeling better.  Tolerating solid diet without vomiting.  Discharge Exam: Vitals:   10/06/21 1401 10/06/21 2033 10/07/21 0523 10/07/21 1323  BP: (!) 130/91 120/87 (!) 150/95 (!) 142/95  Pulse: 96 97 93 98  Resp: 20 16 18 18   Temp: 98.4 F (36.9 C) 98.6 F (37 C) 98.1 F (36.7 C) 99.3 F (37.4  C)  TempSrc: Oral Oral Oral Oral  SpO2: 99% 99% 98% 100%  Weight:      Height:        General: Pt is alert, awake, not in acute distress Cardiovascular: RRR, S1/S2 +, no rubs, no gallops Respiratory: CTA bilaterally, no wheezing, no rhonchi Abdominal: Soft, NT, ND, bowel sounds + Extremities: no edema, no cyanosis    The results of significant diagnostics from this hospitalization (including imaging, microbiology, ancillary and laboratory) are listed below for reference.     Microbiology: Recent Results (from the past 240 hour(s))  Culture, blood (routine x 2)     Status: None (Preliminary result)   Collection Time: 10/04/21 12:27 PM   Specimen: BLOOD  Result Value Ref Range Status   Specimen Description   Final    BLOOD BLOOD RIGHT ARM Performed at [facility], [address] [place], [place]    Special Requests   Final    BOTTLES DRAWN AEROBIC AND ANAEROBIC Blood Culture results may not be optimal due to an excessive volume of blood received in culture bottles Performed at [facility], [address] [place], [place]    Culture   Final    NO GROWTH 3 DAYS Performed at [facility], [address], [place], [place] [address]    Report Status PENDING  Incomplete  Culture, blood (routine x 2)     Status: None (Preliminary result)   Collection Time: 10/05/21  5:19 AM   Specimen: BLOOD  Result Value Ref Range Status   Specimen Description   Final    BLOOD BLOOD LEFT HAND Performed at [facility], [address] [place], [place] [address]    Special Requests   Final    BOTTLES DRAWN AEROBIC ONLY Blood Culture adequate volume Performed at [facility], [address] [place], [place] [address]    Culture  Setup Time   Final    GRAM POSITIVE RODS AEROBIC BOTTLE ONLY CRITICAL RESULT CALLED TO, READ BACK BY AND VERIFIED WITH: PHARMD M.STWYNE AT 0818 ON 10/07/2021  BY T.SAAD.    Culture   Final    NO GROWTH 2 DAYS Performed at [facility], [address], [place], [place] [address]    Report Status PENDING  Incomplete     Labs: BNP (last 3 results) No results for input(s): "BNP" in the last 8760 hours. Basic Metabolic Panel: Recent Labs  Lab 10/04/21 1227 10/05/21 0519 10/06/21 0447  NA 134* 138 135  K 3.8 3.7 4.1  CL 100 106 106  CO2 24 23 24   GLUCOSE 374* 181* 133*  BUN 24* 24* 14  CREATININE 1.23* 1.16* 0.95  CALCIUM 9.8 9.2 9.1   Liver Function Tests: Recent Labs  Lab 10/04/21 1227 10/05/21 0519  AST 22 16  ALT 22 16  ALKPHOS 92 65  BILITOT 1.3* 0.9  PROT 8.4* 6.6  ALBUMIN 3.8 3.0*   Recent Labs  Lab 10/04/21 1227  LIPASE 24   No results for input(s): "AMMONIA" in the last 168 hours. CBC: Recent Labs  Lab 10/04/21 1227 10/05/21 0519  WBC 9.6 9.9  HGB 12.6 10.8*  HCT 38.3 33.8*  MCV 87.4 90.6  PLT 472* 351   Cardiac Enzymes: No results for input(s): "CKTOTAL", "CKMB", "CKMBINDEX", "TROPONINI" in the last 168 hours. BNP: Invalid input(s): "POCBNP" CBG: Recent Labs  Lab 10/06/21 1200 10/06/21 1607 10/06/21 2128 10/07/21 0749 10/07/21 1144  GLUCAP 142* 264* 100* 157* 172*   D-Dimer No results for input(s): "DDIMER" in the last 72 hours. Hgb A1c Recent Labs    10/06/21 0447  HGBA1C 10.3*   Lipid Profile No results for input(s): "CHOL", "HDL", "LDLCALC", "TRIG", "CHOLHDL", "LDLDIRECT" in the last 72 hours. Thyroid function studies No results for input(s): "TSH", "T4TOTAL", "T3FREE", "THYROIDAB" in the last 72 hours.  Invalid input(s): "FREET3" Anemia work up No results for input(s): "VITAMINB12", "FOLATE", "FERRITIN", "TIBC", "IRON", "RETICCTPCT" in the last 72 hours. Urinalysis    Component Value Date/Time   COLORURINE YELLOW 10/04/2021 1210   APPEARANCEUR CLEAR 10/04/2021 1210   LABSPEC 1.020 10/04/2021 1210   PHURINE 5.0 10/04/2021 1210   GLUCOSEU >=500 (A) 10/04/2021 1210    GLUCOSEU NEGATIVE 05/22/2020 1417   HGBUR SMALL (A) 10/04/2021 1210   BILIRUBINUR NEGATIVE 10/04/2021 1210   KETONESUR 5 (A) 10/04/2021 1210   PROTEINUR >=300 (A) 10/04/2021 1210   UROBILINOGEN 0.2 05/22/2020 1417   NITRITE NEGATIVE 10/04/2021 1210   LEUKOCYTESUR NEGATIVE 10/04/2021 1210   Sepsis Labs Recent Labs  Lab 10/04/21 1227 10/05/21 0519  WBC 9.6 9.9   Microbiology Recent Results (from the past 240 hour(s))  Culture, blood (routine x 2)     Status: None (Preliminary result)   Collection Time:  10/04/21 12:27 PM   Specimen: BLOOD  Result Value Ref Range Status   Specimen Description   Final    BLOOD BLOOD RIGHT ARM Performed at [facility], [address] [place], [place] [address]    Special Requests   Final    BOTTLES DRAWN AEROBIC AND ANAEROBIC Blood Culture results may not be optimal due to an excessive volume of blood received in culture bottles Performed at [facility], [address], [place], [place] [address]    Culture   Final    NO GROWTH 3 DAYS Performed at [facility], [address], [place], [place] [address]    Report Status PENDING  Incomplete  Culture, blood (routine x 2)     Status: None (Preliminary result)   Collection Time: 10/05/21  5:19 AM   Specimen: BLOOD  Result Value Ref Range Status   Specimen Description   Final    BLOOD BLOOD LEFT HAND Performed at [facility], [address], [place], [place] [address]    Special Requests   Final    BOTTLES DRAWN AEROBIC ONLY Blood Culture adequate volume Performed at [facility], [address], [place], [place] [address]    Culture  Setup Time   Final    GRAM POSITIVE RODS AEROBIC BOTTLE ONLY CRITICAL RESULT CALLED TO, READ BACK BY AND VERIFIED WITH: PHARMD M.STWYNE AT 0818 ON 10/07/2021 BY T.SAAD.    Culture   Final    NO GROWTH 2 DAYS Performed at [facility], [address] [place],  [place] [address]    Report Status PENDING  Incomplete     Time coordinating discharge:  SIGNED:   Erick Blinks, MD  [facility] 10/07/2021, 8:30 PM   If 7PM-7AM, please contact night-coverage [email or web address]

## 2021-10-07 NOTE — TOC Initial Note (Addendum)
Transition of Care [facility]) - Initial/Assessment Note    Patient Details  Name: Kelli Hudson MRN: [id number] Date of Birth: 11/21/1990  [facility]) CM/SW Contact:    Golda Acre, RN Phone Number: 10/07/2021, 8:12 AM  Clinical Narrative:                  Transition of Care [facility]) Screening Note   Patient Details  Name: Kelli Hudson Date of Birth: [birth year]   Transition of Care [facility]) CM/SW Contact:    Golda Acre, RN Phone Number: 10/07/2021, 8:13 AM    Transition of Care Department [facility]) has reviewed patient and no TOC needs have been identified at this time. We will continue to monitor patient advancement through interdisciplinary progression rounds. If new patient transition needs arise, please place a TOC consult.    Expected Discharge Plan: Home/Self Care Barriers to Discharge: Continued Medical Work up   Patient Goals and CMS Choice Patient states their goals for this hospitalization and ongoing recovery are:: to go home CMS Medicare.gov Compare Post Acute Care list provided to:: Patient Choice offered to / list presented to : Patient  Expected Discharge Plan and Services Expected Discharge Plan: Home/Self Care   Discharge Planning Services: CM Consult   Living arrangements for the past 2 months: Single Family Home                                      Prior Living Arrangements/Services Living arrangements for the past 2 months: Single Family Home Lives with:: Self Patient language and need for interpreter reviewed:: Yes Do you feel safe going back to the place where you live?: Yes            Criminal Activity/Legal Involvement Pertinent to Current Situation/Hospitalization: No - Comment as needed  Activities of Daily Living Home Assistive Devices/Equipment: None ADL Screening (condition at time of admission) Patient's cognitive ability adequate to safely complete daily activities?: Yes Is the patient  deaf or have difficulty hearing?: No Does the patient have difficulty seeing, even when wearing glasses/contacts?: No Does the patient have difficulty concentrating, remembering, or making decisions?: No Patient able to express need for assistance with ADLs?: Yes Does the patient have difficulty dressing or bathing?: No Independently performs ADLs?: Yes (appropriate for developmental age) Does the patient have difficulty walking or climbing stairs?: No Weakness of Legs: None Weakness of Arms/Hands: None  Permission Sought/Granted                  Emotional Assessment Appearance:: Appears stated age Attitude/Demeanor/Rapport: Engaged Affect (typically observed): Calm Orientation: : Oriented to Self, Oriented to Place, Oriented to  Time, Oriented to Situation Alcohol / Substance Use: Alcohol Use Psych Involvement: No (comment)  Admission diagnosis:  Intractable vomiting [R11.10] Intractable nausea and vomiting [R11.2] Nausea and vomiting [R11.2] Patient Active Problem List   Diagnosis Date Noted   Nausea and vomiting 10/05/2021   Diabetic gastroparesis (HCC) 09/21/2021   Drug-seeking behavior 09/21/2021   Essential hypertension 08/25/2021   Dyslipidemia 08/25/2021   Type 2 diabetes mellitus without complications (HCC) 08/25/2021   GERD without esophagitis 08/25/2021   Coffee ground emesis    Prolonged Q-T interval on ECG    Gastroparesis 07/30/2021   Hyperglycemia 06/10/2021   Anemia of chronic disease 06/06/2021   Intractable nausea and vomiting 04/20/2021   Ulcerated, foot, left, with fat layer exposed (HCC) 04/20/2021  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) 04/10/2021   DKA (diabetic ketoacidosis) (HCC) 02/24/2021   Type 1 diabetes mellitus with diabetic polyneuropathy (HCC) 12/18/2020   Uncontrolled type 1 diabetes mellitus with hyperglycemia, with long-term current use of insulin (HCC) 12/18/2020   DM (diabetes mellitus), type 1 (HCC)  12/18/2020   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) 11/09/2020   Diabetic retinopathy of both eyes associated with type 1 diabetes mellitus (HCC) 09/24/2020   Diabetic gastroparesis associated with type 1 diabetes mellitus (HCC) 08/12/2020   AKI (acute kidney injury) (HCC) 07/25/2020   GERD (gastroesophageal reflux disease) 07/23/2020   Diabetic nephropathy associated with type 1 diabetes mellitus (HCC) 06/27/2020   HLD (hyperlipidemia) 05/23/2020   Normocytic anemia 04/20/2020   Depression with anxiety 07/05/2019   Diabetic polyneuropathy associated with type 1 diabetes mellitus (HCC) 09/24/2017   Goiter 10/05/2009   Hypertension associated with diabetes (HCC) 10/05/2009   PCP:  Loyola Mast, MD Pharmacy:   [facility] [id number] - [place], [place] - [address] AT [facility] [address] [place] [address] Phone: [phone] Fax: [phone]  [facility] [address] [place] [address] Phone: [phone] Fax: [phone]  [email or web address] - [place], [place] - [address] [place] [address] Phone: [phone] Fax: [phone]     Social Determinants of Health (SDOH) Interventions    Readmission Risk Interventions    08/27/2021   12:14 PM 06/11/2021    1:01 PM 05/29/2021    9:33 AM  Readmission Risk Prevention Plan  Transportation Screening Complete Complete Complete  Medication Review Ocean[facility]) Complete Complete   PCP or Specialist appointment within 3-5 days of discharge Complete Complete Complete  HRI or Home Care Consult Complete Complete Complete  SW Recovery Care/Counseling Consult Complete Complete Complete  Palliative Care Screening Not Applicable Not Applicable Not Applicable  Skilled Nursing Facility Not Applicable Not Applicable Not Applicable

## 2021-10-07 NOTE — Progress Notes (Signed)
Patient has been read and explained discharge instructions. Patient has no further questions at this time. IV of the right arm has been removed. Site is clean, dry and intact.  Clary Boulais, RN 

## 2021-10-08 ENCOUNTER — Telehealth: Payer: Self-pay

## 2021-10-08 LAB — CULTURE, BLOOD (ROUTINE X 2): Special Requests: ADEQUATE

## 2021-10-08 NOTE — Telephone Encounter (Signed)
  Care Management   Follow Up Note   10/08/2021 Name: Kelli Hudson MRN: [id number] DOB: 1990/06/07   Referred by: Loyola Mast, MD Reason for referral : Care Coordination   An unsuccessful telephone outreach was attempted today. The patient was referred to the case management team for assistance with care management and care coordination.   Follow Up Plan: The care management team will reach out to the patient again over the next 7  days.   George Ina RN,BSN,CCM RN Care Manager Coordinator [phone]

## 2021-10-09 ENCOUNTER — Encounter (HOSPITAL_COMMUNITY): Payer: Self-pay

## 2021-10-09 ENCOUNTER — Inpatient Hospital Stay (HOSPITAL_COMMUNITY)
Admission: EM | Admit: 2021-10-09 | Discharge: 2021-10-14 | DRG: 074 | Disposition: A | Discharge status: Home / Self Care | Payer: 59 | Attending: Internal Medicine | Admitting: Internal Medicine

## 2021-10-09 ENCOUNTER — Emergency Department (HOSPITAL_COMMUNITY): Payer: 59

## 2021-10-09 DIAGNOSIS — I152 Hypertension secondary to endocrine disorders: Secondary | ICD-10-CM | POA: Diagnosis present

## 2021-10-09 DIAGNOSIS — Z89422 Acquired absence of other left toe(s): Secondary | ICD-10-CM

## 2021-10-09 DIAGNOSIS — E1043 Type 1 diabetes mellitus with diabetic autonomic (poly)neuropathy: Principal | ICD-10-CM | POA: Diagnosis present

## 2021-10-09 DIAGNOSIS — E10319 Type 1 diabetes mellitus with unspecified diabetic retinopathy without macular edema: Secondary | ICD-10-CM | POA: Diagnosis present

## 2021-10-09 DIAGNOSIS — R69 Illness, unspecified: Secondary | ICD-10-CM | POA: Diagnosis not present

## 2021-10-09 DIAGNOSIS — K219 Gastro-esophageal reflux disease without esophagitis: Secondary | ICD-10-CM | POA: Diagnosis present

## 2021-10-09 DIAGNOSIS — E1021 Type 1 diabetes mellitus with diabetic nephropathy: Secondary | ICD-10-CM | POA: Diagnosis present

## 2021-10-09 DIAGNOSIS — F418 Other specified anxiety disorders: Secondary | ICD-10-CM | POA: Diagnosis not present

## 2021-10-09 DIAGNOSIS — R933 Abnormal findings on diagnostic imaging of other parts of digestive tract: Secondary | ICD-10-CM | POA: Diagnosis present

## 2021-10-09 DIAGNOSIS — K3184 Gastroparesis: Secondary | ICD-10-CM | POA: Diagnosis not present

## 2021-10-09 DIAGNOSIS — E1143 Type 2 diabetes mellitus with diabetic autonomic (poly)neuropathy: Secondary | ICD-10-CM | POA: Diagnosis present

## 2021-10-09 DIAGNOSIS — E1042 Type 1 diabetes mellitus with diabetic polyneuropathy: Secondary | ICD-10-CM | POA: Diagnosis present

## 2021-10-09 DIAGNOSIS — R9431 Abnormal electrocardiogram [ECG] [EKG]: Secondary | ICD-10-CM | POA: Diagnosis present

## 2021-10-09 DIAGNOSIS — Z8041 Family history of malignant neoplasm of ovary: Secondary | ICD-10-CM

## 2021-10-09 DIAGNOSIS — Z8042 Family history of malignant neoplasm of prostate: Secondary | ICD-10-CM

## 2021-10-09 DIAGNOSIS — D649 Anemia, unspecified: Secondary | ICD-10-CM | POA: Diagnosis present

## 2021-10-09 DIAGNOSIS — R110 Nausea: Secondary | ICD-10-CM | POA: Diagnosis not present

## 2021-10-09 DIAGNOSIS — E785 Hyperlipidemia, unspecified: Secondary | ICD-10-CM | POA: Diagnosis not present

## 2021-10-09 DIAGNOSIS — D75839 Thrombocytosis, unspecified: Secondary | ICD-10-CM | POA: Diagnosis present

## 2021-10-09 DIAGNOSIS — R000 Tachycardia, unspecified: Secondary | ICD-10-CM

## 2021-10-09 DIAGNOSIS — R1110 Vomiting, unspecified: Principal | ICD-10-CM

## 2021-10-09 DIAGNOSIS — I10 Essential (primary) hypertension: Secondary | ICD-10-CM | POA: Diagnosis not present

## 2021-10-09 DIAGNOSIS — Z794 Long term (current) use of insulin: Secondary | ICD-10-CM

## 2021-10-09 DIAGNOSIS — F419 Anxiety disorder, unspecified: Secondary | ICD-10-CM | POA: Diagnosis present

## 2021-10-09 DIAGNOSIS — E1065 Type 1 diabetes mellitus with hyperglycemia: Secondary | ICD-10-CM | POA: Diagnosis present

## 2021-10-09 DIAGNOSIS — Z79899 Other long term (current) drug therapy: Secondary | ICD-10-CM

## 2021-10-09 DIAGNOSIS — Z833 Family history of diabetes mellitus: Secondary | ICD-10-CM

## 2021-10-09 DIAGNOSIS — E1159 Type 2 diabetes mellitus with other circulatory complications: Secondary | ICD-10-CM | POA: Diagnosis not present

## 2021-10-09 DIAGNOSIS — R1084 Generalized abdominal pain: Secondary | ICD-10-CM | POA: Diagnosis not present

## 2021-10-09 DIAGNOSIS — Z8249 Family history of ischemic heart disease and other diseases of the circulatory system: Secondary | ICD-10-CM

## 2021-10-09 DIAGNOSIS — Z800 Family history of malignant neoplasm of digestive organs: Secondary | ICD-10-CM

## 2021-10-09 DIAGNOSIS — R112 Nausea with vomiting, unspecified: Secondary | ICD-10-CM | POA: Diagnosis not present

## 2021-10-09 DIAGNOSIS — E1069 Type 1 diabetes mellitus with other specified complication: Secondary | ICD-10-CM | POA: Diagnosis present

## 2021-10-09 DIAGNOSIS — Z801 Family history of malignant neoplasm of trachea, bronchus and lung: Secondary | ICD-10-CM

## 2021-10-09 DIAGNOSIS — Z818 Family history of other mental and behavioral disorders: Secondary | ICD-10-CM

## 2021-10-09 DIAGNOSIS — Z803 Family history of malignant neoplasm of breast: Secondary | ICD-10-CM

## 2021-10-09 DIAGNOSIS — Z9641 Presence of insulin pump (external) (internal): Secondary | ICD-10-CM | POA: Diagnosis present

## 2021-10-09 DIAGNOSIS — F32A Depression, unspecified: Secondary | ICD-10-CM | POA: Diagnosis present

## 2021-10-09 DIAGNOSIS — N179 Acute kidney failure, unspecified: Secondary | ICD-10-CM | POA: Diagnosis not present

## 2021-10-09 DIAGNOSIS — R079 Chest pain, unspecified: Secondary | ICD-10-CM | POA: Diagnosis not present

## 2021-10-09 HISTORY — DX: Hematemesis: K92.0

## 2021-10-09 LAB — COMPREHENSIVE METABOLIC PANEL
ALT: 15 U/L (ref 0–44)
AST: 17 U/L (ref 15–41)
Albumin: 3.3 g/dL — ABNORMAL LOW (ref 3.5–5.0)
Alkaline Phosphatase: 72 U/L (ref 38–126)
Anion gap: 9 (ref 5–15)
BUN: 15 mg/dL (ref 6–20)
CO2: 24 mmol/L (ref 22–32)
Calcium: 9.8 mg/dL (ref 8.9–10.3)
Chloride: 107 mmol/L (ref 98–111)
Creatinine, Ser: 1.15 mg/dL — ABNORMAL HIGH (ref 0.44–1.00)
GFR, Estimated: >60 mL/min (ref >60)
Glucose, Bld: 214 mg/dL — ABNORMAL HIGH (ref 70–99)
Potassium: 3.9 mmol/L (ref 3.5–5.1)
Sodium: 140 mmol/L (ref 135–145)
Total Bilirubin: 1.3 mg/dL — ABNORMAL HIGH (ref 0.3–1.2)
Total Protein: 6.9 g/dL (ref 6.5–8.1)

## 2021-10-09 LAB — URINALYSIS, ROUTINE W REFLEX MICROSCOPIC
Bilirubin Urine: NEGATIVE
Glucose, UA: >=500 mg/dL — AB
Ketones, ur: 5 mg/dL — AB
Leukocytes,Ua: NEGATIVE
Nitrite: NEGATIVE
Protein, ur: >=300 mg/dL — AB
Specific Gravity, Urine: 1.015 (ref 1.005–1.030)
pH: 7 (ref 5.0–8.0)

## 2021-10-09 LAB — CULTURE, BLOOD (ROUTINE X 2): Culture: NO GROWTH

## 2021-10-09 LAB — CBC WITH DIFFERENTIAL/PLATELET
Abs Immature Granulocytes: 0.02 10*3/uL (ref 0.00–0.07)
Basophils Absolute: 0 10*3/uL (ref 0.0–0.1)
Basophils Relative: 0 %
Eosinophils Absolute: 0.1 10*3/uL (ref 0.0–0.5)
Eosinophils Relative: 1 %
HCT: 41.4 % (ref 36.0–46.0)
Hemoglobin: 13.6 g/dL (ref 12.0–15.0)
Immature Granulocytes: 0 %
Lymphocytes Relative: 28 %
Lymphs Abs: 2.5 10*3/uL (ref 0.7–4.0)
MCH: 28.9 pg (ref 26.0–34.0)
MCHC: 32.9 g/dL (ref 30.0–36.0)
MCV: 88.1 fL (ref 80.0–100.0)
Monocytes Absolute: 0.6 10*3/uL (ref 0.1–1.0)
Monocytes Relative: 7 %
Neutro Abs: 5.6 10*3/uL (ref 1.7–7.7)
Neutrophils Relative %: 64 %
Platelets: 481 10*3/uL — ABNORMAL HIGH (ref 150–400)
RBC: 4.7 MIL/uL (ref 3.87–5.11)
RDW: 13.7 % (ref 11.5–15.5)
WBC: 8.9 10*3/uL (ref 4.0–10.5)
nRBC: 0 % (ref 0.0–0.2)

## 2021-10-09 LAB — LIPASE, BLOOD: Lipase: 23 U/L (ref 11–51)

## 2021-10-09 LAB — TROPONIN I (HIGH SENSITIVITY)
Troponin I (High Sensitivity): 3 ng/L (ref <18)
Troponin I (High Sensitivity): 3 ng/L (ref <18)

## 2021-10-09 LAB — I-STAT BETA HCG BLOOD, ED (MC, WL, AP ONLY): I-stat hCG, quantitative: <5 m[IU]/mL (ref <5)

## 2021-10-09 MED ORDER — PANTOPRAZOLE SODIUM 40 MG PO TBEC
40.0000 mg | DELAYED_RELEASE_TABLET | Freq: Two times a day (BID) | ORAL | Status: DC
  Administered 2021-10-10 – 2021-10-12 (×5): 40 mg via ORAL
  Filled 2021-10-09 (×5): qty 1

## 2021-10-09 MED ORDER — HYDROMORPHONE HCL 2 MG/ML IJ SOLN
1.0000 mg | Freq: Once | INTRAMUSCULAR | Status: AC
  Administered 2021-10-09: 1 mg via INTRAVENOUS
  Filled 2021-10-09: qty 1

## 2021-10-09 MED ORDER — HYDROXYZINE HCL 25 MG PO TABS
25.0000 mg | ORAL_TABLET | Freq: Three times a day (TID) | ORAL | Status: DC | PRN
Start: 2021-10-09 — End: 2021-10-14
  Administered 2021-10-11 – 2021-10-12 (×3): 25 mg via ORAL
  Filled 2021-10-09 (×4): qty 1

## 2021-10-09 MED ORDER — LORAZEPAM 2 MG/ML IJ SOLN
0.5000 mg | Freq: Once | INTRAMUSCULAR | Status: DC
  Filled 2021-10-09: qty 1

## 2021-10-09 MED ORDER — INSULIN ASPART 100 UNIT/ML IJ SOLN
0.0000 [IU] | Freq: Three times a day (TID) | INTRAMUSCULAR | Status: DC
  Administered 2021-10-10: 8 [IU] via SUBCUTANEOUS
  Administered 2021-10-10: 5 [IU] via SUBCUTANEOUS
  Administered 2021-10-10: 8 [IU] via SUBCUTANEOUS
  Administered 2021-10-11 (×2): 3 [IU] via SUBCUTANEOUS
  Administered 2021-10-12: 2 [IU] via SUBCUTANEOUS
  Administered 2021-10-12: 3 [IU] via SUBCUTANEOUS
  Administered 2021-10-12: 2 [IU] via SUBCUTANEOUS
  Administered 2021-10-13: 5 [IU] via SUBCUTANEOUS
  Administered 2021-10-13 – 2021-10-14 (×2): 3 [IU] via SUBCUTANEOUS
  Filled 2021-10-09: qty 0.15

## 2021-10-09 MED ORDER — SODIUM CHLORIDE 0.9% FLUSH
3.0000 mL | Freq: Two times a day (BID) | INTRAVENOUS | Status: DC
Start: 2021-10-09 — End: 2021-10-14
  Administered 2021-10-09 – 2021-10-13 (×7): 3 mL via INTRAVENOUS

## 2021-10-09 MED ORDER — ATORVASTATIN CALCIUM 20 MG PO TABS
20.0000 mg | ORAL_TABLET | Freq: Every day | ORAL | Status: DC
  Administered 2021-10-10 – 2021-10-11 (×2): 20 mg via ORAL
  Filled 2021-10-09: qty 2
  Filled 2021-10-09: qty 1

## 2021-10-09 MED ORDER — INSULIN DETEMIR 100 UNIT/ML ~~LOC~~ SOLN
16.0000 [IU] | Freq: Every day | SUBCUTANEOUS | Status: DC
  Administered 2021-10-10 – 2021-10-14 (×5): 16 [IU] via SUBCUTANEOUS
  Filled 2021-10-09 (×5): qty 0.16

## 2021-10-09 MED ORDER — ENOXAPARIN SODIUM 40 MG/0.4ML IJ SOSY
40.0000 mg | PREFILLED_SYRINGE | INTRAMUSCULAR | Status: DC
  Administered 2021-10-10 – 2021-10-14 (×5): 40 mg via SUBCUTANEOUS
  Filled 2021-10-09 (×5): qty 0.4

## 2021-10-09 MED ORDER — SODIUM CHLORIDE 0.9 % IV BOLUS
1000.0000 mL | Freq: Once | INTRAVENOUS | Status: AC
  Administered 2021-10-09: 1000 mL via INTRAVENOUS

## 2021-10-09 MED ORDER — POLYETHYLENE GLYCOL 3350 17 G PO PACK
17.0000 g | PACK | Freq: Every day | ORAL | Status: DC | PRN
  Filled 2021-10-09: qty 1

## 2021-10-09 MED ORDER — ACETAMINOPHEN 325 MG PO TABS
650.0000 mg | ORAL_TABLET | Freq: Four times a day (QID) | ORAL | Status: DC | PRN
  Administered 2021-10-11: 650 mg via ORAL
  Filled 2021-10-09: qty 2

## 2021-10-09 MED ORDER — PROCHLORPERAZINE EDISYLATE 10 MG/2ML IJ SOLN
10.0000 mg | INTRAMUSCULAR | Status: DC | PRN
  Administered 2021-10-09 – 2021-10-12 (×8): 10 mg via INTRAVENOUS
  Filled 2021-10-09 (×9): qty 2

## 2021-10-09 MED ORDER — PANTOPRAZOLE SODIUM 40 MG IV SOLR
40.0000 mg | Freq: Once | INTRAVENOUS | Status: AC
  Administered 2021-10-09: 40 mg via INTRAVENOUS
  Filled 2021-10-09: qty 10

## 2021-10-09 MED ORDER — METOCLOPRAMIDE HCL 5 MG/ML IJ SOLN
10.0000 mg | Freq: Once | INTRAMUSCULAR | Status: AC
  Administered 2021-10-09: 10 mg via INTRAVENOUS
  Filled 2021-10-09: qty 2

## 2021-10-09 MED ORDER — KETOROLAC TROMETHAMINE 30 MG/ML IJ SOLN
30.0000 mg | Freq: Four times a day (QID) | INTRAMUSCULAR | Status: DC | PRN
Start: 2021-10-09 — End: 2021-10-11
  Administered 2021-10-09 – 2021-10-11 (×5): 30 mg via INTRAVENOUS
  Filled 2021-10-09 (×6): qty 1

## 2021-10-09 MED ORDER — LORAZEPAM 2 MG/ML IJ SOLN
1.0000 mg | Freq: Once | INTRAMUSCULAR | Status: AC
  Administered 2021-10-09: 1 mg via INTRAVENOUS
  Filled 2021-10-09: qty 1

## 2021-10-09 MED ORDER — LORAZEPAM 2 MG/ML IJ SOLN: 0.5000 mg | Freq: Once | INTRAMUSCULAR | Status: DC

## 2021-10-09 MED ORDER — METOCLOPRAMIDE HCL 5 MG/ML IJ SOLN
10.0000 mg | Freq: Four times a day (QID) | INTRAMUSCULAR | Status: DC
  Administered 2021-10-10 – 2021-10-12 (×8): 10 mg via INTRAVENOUS
  Filled 2021-10-09 (×9): qty 2

## 2021-10-09 MED ORDER — LORAZEPAM 2 MG/ML IJ SOLN
0.5000 mg | Freq: Once | INTRAMUSCULAR | Status: AC
  Administered 2021-10-09: 0.5 mg via INTRAVENOUS
  Filled 2021-10-09: qty 1

## 2021-10-09 MED ORDER — ACETAMINOPHEN 650 MG RE SUPP: 650.0000 mg | Freq: Four times a day (QID) | RECTAL | Status: DC | PRN

## 2021-10-09 MED ORDER — CARVEDILOL 6.25 MG PO TABS
6.2500 mg | ORAL_TABLET | Freq: Two times a day (BID) | ORAL | Status: DC
  Administered 2021-10-10 – 2021-10-14 (×9): 6.25 mg via ORAL
  Filled 2021-10-09 (×4): qty 1
  Filled 2021-10-09: qty 2
  Filled 2021-10-09 (×4): qty 1

## 2021-10-09 MED ORDER — HYDROMORPHONE HCL 2 MG/ML IJ SOLN
1.0000 mg | Freq: Once | INTRAMUSCULAR | Status: AC
  Administered 2021-10-10: 1 mg via INTRAVENOUS
  Filled 2021-10-09: qty 1

## 2021-10-09 MED ORDER — HYDROMORPHONE HCL 2 MG/ML IJ SOLN: 0.5000 mg | Freq: Once | INTRAMUSCULAR | Status: DC

## 2021-10-09 NOTE — H&P (Signed)
History and Physical   Kelli Hudson GDJ:[id number] DOB: 06-17-1990 DOA: 10/09/2021  PCP: Loyola Mast, MD   Patient coming from: Home  Chief Complaint: Nausea vomiting, abdominal pain  HPI: Kelli Hudson is a 31 y.o. female with medical history significant of type 1 diabetes with neuropathy, nephropathy, retinopathy, gastroparesis, diabetic ulcers; hypertension; GERD; depression, anxiety; anemia; hyperlipidemia presenting with ongoing nausea vomiting and abdominal pain  Patient presenting with recurrent episode of intractable nausea vomiting with abdominal pain.  Has history of gastroparesis as above and has recurrent episodes requiring admission.  This episode has been ongoing for 2 days which significantly worse today with abdominal pain today.  She describes the pain is generalized and is also reporting some associated chest pain.  She denies fevers, chills, constipation, diarrhea.  ED Course: Vital signs in the ED significant for heart rate in the 110s to 140s with her baseline heart rate apparently being in the 90s to 100s, blood pressure in the 120s to 190s systolic.  Lab work-up included CMP with creatinine stable 1.15, glucose 214, albumin 3.3, T. bili 1.3.  CBC with thrombocytosis to 41.  Troponin normal x2.  Lipase normal.  Urinalysis with glucose, hemoglobin, protein, leukocytes, rare bacteria.  Chest x-ray showed no acute abnormality.  Patient received a dose of Dilaudid, Ativan in the ED also received Compazine and Reglan as well as PPI without improvement in her nausea and vomiting.  In addition did receive 2 L of IV fluids.  Patient is to be admitted for continued management of intractable nausea vomiting.  Review of Systems: As per HPI otherwise all other systems reviewed and are negative.  Past Medical History:  Diagnosis Date   Acute H. pylori gastric ulcer    Coffee ground emesis    Diabetes mellitus (HCC)    Diabetic gastroparesis (HCC)    DKA  (diabetic ketoacidosis) (HCC) 02/24/2021   Gastroparesis    GERD (gastroesophageal reflux disease)    Hypertension    Hyperthyroidism    Intractable nausea and vomiting 04/20/2021    Past Surgical History:  Procedure Laterality Date   AMPUTATION TOE Left 03/10/2018   Procedure: AMPUTATION FIFTH TOE;  Surgeon: Vivi Barrack, DPM;  Location: [facility];  Service: Podiatry;  Laterality: Left;   BIOPSY  01/28/2020   Procedure: BIOPSY;  Surgeon: Kathi Der, MD;  Location: [facility];  Service: Gastroenterology;;   BOTOX INJECTION  08/26/2021   Procedure: BOTOX INJECTION;  Surgeon: Sherrilyn Rist, MD;  Location: [facility];  Service: Gastroenterology;;   ESOPHAGOGASTRODUODENOSCOPY N/A 01/28/2020   Procedure: ESOPHAGOGASTRODUODENOSCOPY (EGD);  Surgeon: Kathi Der, MD;  Location: [facility];  Service: Gastroenterology;  Laterality: N/A;   ESOPHAGOGASTRODUODENOSCOPY (EGD) WITH PROPOFOL Left 09/08/2015   Procedure: ESOPHAGOGASTRODUODENOSCOPY (EGD) WITH PROPOFOL;  Surgeon: Willis Modena, MD;  Location: [facility];  Service: Endoscopy;  Laterality: Left;   ESOPHAGOGASTRODUODENOSCOPY (EGD) WITH PROPOFOL N/A 08/26/2021   Procedure: ESOPHAGOGASTRODUODENOSCOPY (EGD) WITH PROPOFOL;  Surgeon: Sherrilyn Rist, MD;  Location: [facility];  Service: Gastroenterology;  Laterality: N/A;   WISDOM TOOTH EXTRACTION      Social History  reports that she has never smoked. She has never used smokeless tobacco. She reports current alcohol use of about 1.0 standard drink of alcohol per week. She reports that she does not use drugs.  No Known Allergies  Family History  Problem Relation Age of Onset   Lung cancer Mother    Diabetes Mother    Bipolar disorder Father  Liver cancer Maternal Grandfather    Diabetes Maternal Aunt        x 2   Pancreatic cancer Maternal Uncle    Prostate cancer Maternal Uncle    Breast cancer Other        maternal great aunt   Heart  disease Other    Ovarian cancer Other        maternal great aunt   Kidney disease Other        maternal great aunt   Colon cancer Neg Hx    Stomach cancer Neg Hx   Reviewed on admission  Prior to Admission medications   Medication Sig Start Date End Date Taking? Authorizing Provider  atorvastatin (LIPITOR) 20 MG tablet Take 1 tablet (20 mg total) by mouth daily. 04/10/21   Loyola Mast, MD  carvedilol (COREG) 6.25 MG tablet Take 1 tablet (6.25 mg total) by mouth 2 (two) times daily with a meal. 07/01/21   Rhetta Mura, MD  erythromycin base (E-MYCIN) 500 MG tablet Take 1 tablet (500 mg total) by mouth 3 (three) times daily for 10 days. 10/07/21 10/17/21  Erick Blinks, MD  hydrOXYzine (VISTARIL) 25 MG capsule Take 1 capsule (25 mg total) by mouth every 8 (eight) hours as needed. Patient taking differently: Take 25 mg by mouth every 8 (eight) hours as needed for anxiety. 07/31/21   Alwyn Ren, MD  insulin detemir (LEVEMIR FLEXTOUCH) 100 UNIT/ML FlexPen Inject 16 Units into the skin daily. 06/14/21   Burnadette Pop, MD  insulin regular (NOVOLIN R) 100 units/mL injection Inject 0-0.15 mLs (0-15 Units total) into the skin See admin instructions. Via pump.Start this medication after you follow up with your PCP Patient taking differently: Inject 0-15 Units into the skin See admin instructions. Using sliding scale 06/14/21   Burnadette Pop, MD  metoCLOPramide (REGLAN) 10 MG tablet Take 1 tablet (10 mg total) by mouth 3 (three) times daily with meals. 10/07/21 10/07/22  Erick Blinks, MD  ondansetron (ZOFRAN) 4 MG tablet Take 1 tablet (4 mg total) by mouth every 8 (eight) hours as needed for nausea or vomiting. 08/27/21 08/27/22  Leatha Gilding, MD  pantoprazole (PROTONIX) 40 MG tablet Take 1 tablet (40 mg total) by mouth 2 (two) times daily before a meal. 08/19/21   Danford, Earl Lites, MD  prochlorperazine (COMPAZINE) 10 MG tablet Take 1/2-1 tablet (5-10 mg total) by mouth every 8  (eight) hours as needed for nausea or vomiting. 10/07/21   Erick Blinks, MD  insulin aspart (NOVOLOG) 100 UNIT/ML FlexPen Inject 5 Units into the skin 3 (three) times daily with meals. 02/23/18 07/05/19  Jerald Kief, MD    Physical Exam: Vitals:   10/09/21 1915 10/09/21 2015 10/09/21 2045 10/09/21 2115  BP: (!) 175/123 (!) 182/126 (!) 190/130 (!) 126/112  Pulse: (!) 115 (!) 118 (!) 118 (!) 150  Resp: 14 18 19 15   Temp:  97.8 F (36.6 C)    TempSrc:      SpO2: 100% 100% 100% 99%    Physical Exam Constitutional:      General: She is not in acute distress.    Appearance: Normal appearance.  HENT:     Head: Normocephalic and atraumatic.     Mouth/Throat:     Mouth: Mucous membranes are moist.     Pharynx: Oropharynx is clear.  Eyes:     Extraocular Movements: Extraocular movements intact.     Pupils: Pupils are equal, round, and reactive to light.  Cardiovascular:  Rate and Rhythm: Regular rhythm. Tachycardia present.     Pulses: Normal pulses.     Heart sounds: Normal heart sounds.  Pulmonary:     Effort: Pulmonary effort is normal. No respiratory distress.     Breath sounds: Normal breath sounds.  Abdominal:     General: Bowel sounds are normal. There is no distension.     Palpations: Abdomen is soft.     Tenderness: There is generalized abdominal tenderness.  Musculoskeletal:        General: No swelling or deformity.  Skin:    General: Skin is warm and dry.  Neurological:     General: No focal deficit present.     Mental Status: Mental status is at baseline.    Labs on Admission: I have personally reviewed following labs and imaging studies  CBC: Recent Labs  Lab 10/04/21 1227 10/05/21 0519 10/09/21 1742  WBC 9.6 9.9 8.9  NEUTROABS  --   --  5.6  HGB 12.6 10.8* 13.6  HCT 38.3 33.8* 41.4  MCV 87.4 90.6 88.1  PLT 472* 351 481*    Basic Metabolic Panel: Recent Labs  Lab 10/04/21 1227 10/05/21 0519 10/06/21 0447 10/09/21 1837  NA 134* 138 135  140  K 3.8 3.7 4.1 3.9  CL 100 106 106 107  CO2 24 23 24 24   GLUCOSE 374* 181* 133* 214*  BUN 24* 24* 14 15  CREATININE 1.23* 1.16* 0.95 1.15*  CALCIUM 9.8 9.2 9.1 9.8    GFR: Estimated Creatinine Clearance: 79.4 mL/min (A) (by C-G formula based on SCr of 1.15 mg/dL (H)).  Liver Function Tests: Recent Labs  Lab 10/04/21 1227 10/05/21 0519 10/09/21 1837  AST 22 16 17   ALT 22 16 15   ALKPHOS 92 65 72  BILITOT 1.3* 0.9 1.3*  PROT 8.4* 6.6 6.9  ALBUMIN 3.8 3.0* 3.3*    Urine analysis:    Component Value Date/Time   COLORURINE YELLOW 10/09/2021 2100   APPEARANCEUR CLEAR 10/09/2021 2100   LABSPEC 1.015 10/09/2021 2100   PHURINE 7.0 10/09/2021 2100   GLUCOSEU >=500 (A) 10/09/2021 2100   GLUCOSEU NEGATIVE 05/22/2020 1417   HGBUR SMALL (A) 10/09/2021 2100   BILIRUBINUR NEGATIVE 10/09/2021 2100   KETONESUR 5 (A) 10/09/2021 2100   PROTEINUR >=300 (A) 10/09/2021 2100   UROBILINOGEN 0.2 05/22/2020 1417   NITRITE NEGATIVE 10/09/2021 2100   LEUKOCYTESUR NEGATIVE 10/09/2021 2100    Radiological Exams on Admission: DG Chest Portable 1 View  Result Date: 10/09/2021 CLINICAL DATA:  Chest pain EXAM: PORTABLE CHEST 1 VIEW COMPARISON:  06/04/2021 FINDINGS: The heart size and mediastinal contours are within normal limits. Both lungs are clear. The visualized skeletal structures are unremarkable. IMPRESSION: Normal study. Electronically Signed   By: [id number] M.D.   On: 10/09/2021 17:19    EKG: Independently reviewed.  Sinus tachycardia at 120 bpm.  QTc prolonged at 546.  Repeat EKG later in the evening showed QTc actually at 397.  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 > Not improved with initial antiemetics in the ED.  Patient also frequently request pain medication was given one-time dose of Dilaudid in the ED for her abdominal pain.  But was told would not be receiving additional doses. > Creatinine stable, is tachycardic but this is typical for her presentation and her baseline heart appears to be in the 90s to 100s. > Initial QTc appear to be elevated but repeat showed normal. - Monitor on telemetry - Schedule Reglan, could consider erythromycin if fails to improve - As needed Compazine - As needed Toradol for severe pain  Type 1 diabetes > Type 1 diabetes complicated by neuropathy, nephropathy, retinopathy, above gastroparesis, history of diabetic ulcer status post amputation. - Continue 16 units insulin daily - SSI  Hypertension - Continue home carvedilol  GERD - Continue PPI  Depression Anxiety - Continue home as needed hydroxyzine  Hyperlipidemia - Continue home atorvastatin  DVT prophylaxis: Lovenox Code Status:   Full Family Communication:  None on admission, patient states family is aware of her. Disposition Plan:   Patient is from:  Home  Anticipated DC to:  Home  Anticipated DC date:  1 to 2 days  Anticipated DC barriers: None  Consults called:  None Admission status:  Observation, telemetry  Severity of Illness: The appropriate patient status for this patient is OBSERVATION. Observation status is judged to be reasonable and necessary in order to provide the required intensity of service to ensure the patient's safety. The  patient's presenting symptoms, physical exam findings, and initial radiographic and laboratory data in the context of their medical condition is felt to place them at decreased risk for further clinical deterioration. Furthermore, it is anticipated that the patient will be medically stable for discharge from the hospital within 2 midnights of admission.    Synetta Fail MD [facility]  How to contact the [facility] Attending or Consulting provider 7A - 7P or covering provider during after hours 7P -7A, for this patient?   Check the care team in [facility] and look for a) attending/consulting [facility] provider listed and b) the [facility] team listed Log into [email or web address] and use [facility] universal password to access. If you do not have the password, please contact the hospital operator. Locate the [facility] provider you are looking for under [facility] and page to a number that you can be directly reached. If you still have difficulty reaching the provider, please page the [facility] (Director on Call) for the Hospitalists listed on amion for assistance.  10/09/2021, 10:26 PM

## 2021-10-09 NOTE — ED Provider Triage Note (Signed)
Emergency Medicine Provider Triage Evaluation Note  Kelli Hudson , a 31 y.o. female  was evaluated in triage.  Pt complains of abdominal pain, chest pain.  Has history of gastroparesis.  States this episode feels similar.  Last p.o. intake yesterday.  Endorses nausea and vomiting.  Review of Systems  Positive: As above Negative: As above  Physical Exam  BP (!) 131/105   Pulse (!) 132   Temp 98.3 F (36.8 C) (Oral)   Resp 20   SpO2 100%  Gen:   Awake, no distress   Resp:  Normal effort  MSK:   Moves extremities without difficulty  Other:  Lysed abdominal tenderness  Medical Decision Making  Medically screening exam initiated at 4:37 PM.  Appropriate orders placed.  Kelli Hudson was informed that the remainder of the evaluation will be completed by another provider, this initial triage assessment does not replace that evaluation, and the importance of remaining in the ED until their evaluation is complete.  Patient during my exam has a heart rate of about 160.  Notified nurse.  Discussed patient needs room soon.    Kelli [place], PA-C 10/09/21 [phone]

## 2021-10-09 NOTE — ED Notes (Signed)
Dr. Melvin at bedside 

## 2021-10-09 NOTE — ED Notes (Signed)
Pt ambulatory to restroom with independent steady gait °

## 2021-10-09 NOTE — ED Triage Notes (Signed)
BIBA Per EMS: Pt coming from home w/ c/o N/V, abd pain. Hx gastroparesis.  181/112 127 HR  100% RA 231cbg

## 2021-10-09 NOTE — ED Notes (Signed)
This RN has witnessed the patient sticking her finger down her throat to induce vomiting multiple times.

## 2021-10-09 NOTE — ED Notes (Signed)
Pt was given a cup of water for fluid challenge but she states she took a few sips and then vomited it up. Josh, PA informed.

## 2021-10-09 NOTE — ED Provider Notes (Signed)
[facility]-EMERGENCY DEPT Provider Note   CSN: [id number] Arrival date & time: 10/09/21  1547     History  Chief Complaint  Patient presents with   Abdominal Pain    Kelli Hudson is a 31 y.o. female.  Patient with history of diabetic gastroparesis, frequent ED visits and hospitalizations for the same, hypertension -- presents to the emergency department for evaluation of chest pain, abdominal pain, vomiting.  She reports hematemesis.  Symptoms worsened this morning.  She states that she does not have anything to use at home for pain.  She states that when she comes to the emergency department she receives IV fluids, Dilaudid, nausea medication.  Pain in the abdomen is generalized.  No urinary symptoms.  No shortness of breath.  Patient typically tachycardic during ED evaluations in the recent past.      Home Medications Prior to Admission medications   Medication Sig Start Date End Date Taking? Authorizing Provider  atorvastatin (LIPITOR) 20 MG tablet Take 1 tablet (20 mg total) by mouth daily. 04/10/21   Loyola Mast, MD  carvedilol (COREG) 6.25 MG tablet Take 1 tablet (6.25 mg total) by mouth 2 (two) times daily with a meal. 07/01/21   Rhetta Mura, MD  erythromycin base (E-MYCIN) 500 MG tablet Take 1 tablet (500 mg total) by mouth 3 (three) times daily for 10 days. 10/07/21 10/17/21  Erick Blinks, MD  hydrOXYzine (VISTARIL) 25 MG capsule Take 1 capsule (25 mg total) by mouth every 8 (eight) hours as needed. Patient taking differently: Take 25 mg by mouth every 8 (eight) hours as needed for anxiety. 07/31/21   Alwyn Ren, MD  insulin detemir (LEVEMIR FLEXTOUCH) 100 UNIT/ML FlexPen Inject 16 Units into the skin daily. 06/14/21   Burnadette Pop, MD  insulin regular (NOVOLIN R) 100 units/mL injection Inject 0-0.15 mLs (0-15 Units total) into the skin See admin instructions. Via pump.Start this medication after you follow up with your  PCP Patient taking differently: Inject 0-15 Units into the skin See admin instructions. Using sliding scale 06/14/21   Burnadette Pop, MD  metoCLOPramide (REGLAN) 10 MG tablet Take 1 tablet (10 mg total) by mouth 3 (three) times daily with meals. 10/07/21 10/07/22  Erick Blinks, MD  ondansetron (ZOFRAN) 4 MG tablet Take 1 tablet (4 mg total) by mouth every 8 (eight) hours as needed for nausea or vomiting. 08/27/21 08/27/22  Leatha Gilding, MD  pantoprazole (PROTONIX) 40 MG tablet Take 1 tablet (40 mg total) by mouth 2 (two) times daily before a meal. 08/19/21   Danford, Earl Lites, MD  prochlorperazine (COMPAZINE) 10 MG tablet Take 1/2-1 tablet (5-10 mg total) by mouth every 8 (eight) hours as needed for nausea or vomiting. 10/07/21   Erick Blinks, MD  insulin aspart (NOVOLOG) 100 UNIT/ML FlexPen Inject 5 Units into the skin 3 (three) times daily with meals. 02/23/18 07/05/19  Jerald Kief, MD      Allergies    Patient has no known allergies.    Review of Systems   Review of Systems  Physical Exam Updated Vital Signs BP (!) 196/131   Pulse (!) 131   Temp 98.3 F (36.8 C) (Oral)   Resp 12   SpO2 100%   Physical Exam Vitals and nursing note reviewed.  Constitutional:      General: She is in acute distress.     Appearance: She is well-developed.  HENT:     Head: Normocephalic and atraumatic.     Right  Ear: External ear normal.     Left Ear: External ear normal.     Nose: Nose normal.  Eyes:     Conjunctiva/sclera: Conjunctivae normal.  Cardiovascular:     Rate and Rhythm: Regular rhythm. Tachycardia present.     Heart sounds: No murmur heard. Pulmonary:     Effort: No respiratory distress.     Breath sounds: No wheezing, rhonchi or rales.  Abdominal:     Palpations: Abdomen is soft.     Tenderness: There is generalized abdominal tenderness. There is no guarding or rebound.  Musculoskeletal:     Cervical back: Normal range of motion and neck supple.     Right lower  leg: No edema.     Left lower leg: No edema.  Skin:    General: Skin is warm and dry.     Findings: No rash.  Neurological:     General: No focal deficit present.     Mental Status: She is alert. Mental status is at baseline.     Motor: No weakness.  Psychiatric:        Mood and Affect: Mood normal.     ED Results / Procedures / Treatments   Labs (all labs ordered are listed, but only abnormal results are displayed) Labs Reviewed  CBC WITH DIFFERENTIAL/PLATELET - Abnormal; Notable for the following components:      Result Value   Platelets 481 (*)    All other components within normal limits  URINALYSIS, ROUTINE W REFLEX MICROSCOPIC - Abnormal; Notable for the following components:   Glucose, UA >=500 (*)    Hgb urine dipstick SMALL (*)    Ketones, ur 5 (*)    Protein, ur >=300 (*)    Bacteria, UA RARE (*)    All other components within normal limits  COMPREHENSIVE METABOLIC PANEL - Abnormal; Notable for the following components:   Glucose, Bld 214 (*)    Creatinine, Ser 1.15 (*)    Albumin 3.3 (*)    Total Bilirubin 1.3 (*)    All other components within normal limits  LIPASE, BLOOD  I-STAT BETA HCG BLOOD, ED ([facility], [facility], [facility] ONLY)  TROPONIN I (HIGH SENSITIVITY)  TROPONIN I (HIGH SENSITIVITY)    ED ECG REPORT   Date: 10/09/2021  Rate: 128  Rhythm: sinus tachycardia  QRS Axis: normal  Intervals: QT prolonged  ST/T Wave abnormalities: nonspecific T wave changes  Conduction Disutrbances:none  Narrative Interpretation:   Old EKG Reviewed: unchanged  I have personally reviewed the EKG tracing and agree with the computerized printout as noted.    Radiology DG Chest Portable 1 View  Result Date: 10/09/2021 CLINICAL DATA:  Chest pain EXAM: PORTABLE CHEST 1 VIEW COMPARISON:  06/04/2021 FINDINGS: The heart size and mediastinal contours are within normal limits. Both lungs are clear. The visualized skeletal structures are unremarkable. IMPRESSION: Normal study.  Electronically Signed   By: Charlett Nose M.D.   On: 10/09/2021 17:19    Procedures Procedures    Medications Ordered in ED Medications - No data to display  ED Course/ Medical Decision Making/ A&P    Patient seen and examined. History obtained directly from patient.  I also reviewed most recent hospitalization notes and ED visits.  Work-up including labs, imaging, EKG ordered in triage, if performed, were reviewed.    RN at bedside establishing ultrasound-guided IV.  Access obtained in the left upper extremity.  Labs/EKG: Ordered in triage.  This included: CBC, CMP, lipase, UA, pregnancy.  Also troponin.  Awaiting results.  EKG with findings as above.  Imaging: Independently visualized and interpreted.  This included: Chest x-ray agree negative.  Medications/Fluids: Ordered: IV fluid bolus, IV Compazine for nausea.  We will also give dose of IV Ativan and IV Protonix.  I discussed with patient that we would give 1 dose of IV Dilaudid, after that time we will focus on medication for nausea and vomiting and try to avoid narcotic pain medications.  Most recent vital signs reviewed and are as follows: BP (!) 182/115   Pulse (!) 126   Temp 98.3 F (36.8 C) (Oral)   Resp 17   SpO2 100%   Initial impression: Recurrent diabetic gastroparesis  10:16 PM Reassessment performed several times in interim. Patient appears stable. Some vomiting after trying to drink water.  Additional reglan and ativan ordered.   Labs personally reviewed and interpreted including: CBC normal white blood cell count, hemoglobin slightly elevated from baseline; CMP glucose 214, normal anion gap, creatinine minimally elevated at 1.15; troponin 3 > 3; pregnancy negative.  Reviewed pertinent lab work and imaging with patient at bedside.  She continues to request IV pain medication.   Most current vital signs reviewed and are as follows: BP (!) 126/112   Pulse (!) 150   Temp 97.8 F (36.6 C)   Resp 15   SpO2  99%   Plan: Admit for uncontrolled vomiting.   Consulted with Dr. Alinda Money of [facility] who will evaluate patient.                            Medical Decision Making Amount and/or Complexity of Data Reviewed ECG/medicine tests: ordered.  Risk Prescription drug management.   Patient with intractable vomiting consistent with previous episodes of diabetic gastroparesis.  No signs of DKA today.  No signs of pancreatitis or other intra-abdominal etiology.  She has had 3 CT abdomen pelvis over the past 6 months.  Without new or concerning findings on lab work, do not feel that she requires repeat imaging today.  No evidence of ACS or PE today.  EKG was sinus tachycardia, typical for patient on admission.  Troponin negative.  She will be admitted for continued symptom control.        Final Clinical Impression(s) / ED Diagnoses Final diagnoses:  Intractable vomiting  Diabetic gastroparesis (HCC)  Sinus tachycardia    Rx / DC Orders ED Discharge Orders     None         Renne Crigler, Cordelia Poche 10/09/21 2222    Charlynne Pander, MD 10/09/21 [phone]

## 2021-10-09 NOTE — ED Notes (Signed)
Pt's IV appears to have infiltrated.

## 2021-10-10 ENCOUNTER — Other Ambulatory Visit: Payer: Self-pay

## 2021-10-10 ENCOUNTER — Ambulatory Visit: Payer: Self-pay

## 2021-10-10 DIAGNOSIS — E785 Hyperlipidemia, unspecified: Secondary | ICD-10-CM | POA: Diagnosis not present

## 2021-10-10 DIAGNOSIS — K219 Gastro-esophageal reflux disease without esophagitis: Secondary | ICD-10-CM | POA: Diagnosis not present

## 2021-10-10 DIAGNOSIS — Z801 Family history of malignant neoplasm of trachea, bronchus and lung: Secondary | ICD-10-CM | POA: Diagnosis not present

## 2021-10-10 DIAGNOSIS — R109 Unspecified abdominal pain: Secondary | ICD-10-CM | POA: Diagnosis not present

## 2021-10-10 DIAGNOSIS — D649 Anemia, unspecified: Secondary | ICD-10-CM | POA: Diagnosis not present

## 2021-10-10 DIAGNOSIS — R69 Illness, unspecified: Secondary | ICD-10-CM | POA: Diagnosis not present

## 2021-10-10 DIAGNOSIS — E1043 Type 1 diabetes mellitus with diabetic autonomic (poly)neuropathy: Secondary | ICD-10-CM | POA: Diagnosis not present

## 2021-10-10 DIAGNOSIS — Z8041 Family history of malignant neoplasm of ovary: Secondary | ICD-10-CM | POA: Diagnosis not present

## 2021-10-10 DIAGNOSIS — K3184 Gastroparesis: Secondary | ICD-10-CM | POA: Diagnosis not present

## 2021-10-10 DIAGNOSIS — Z818 Family history of other mental and behavioral disorders: Secondary | ICD-10-CM | POA: Diagnosis not present

## 2021-10-10 DIAGNOSIS — Z8042 Family history of malignant neoplasm of prostate: Secondary | ICD-10-CM | POA: Diagnosis not present

## 2021-10-10 DIAGNOSIS — E1042 Type 1 diabetes mellitus with diabetic polyneuropathy: Secondary | ICD-10-CM | POA: Diagnosis not present

## 2021-10-10 DIAGNOSIS — Z89422 Acquired absence of other left toe(s): Secondary | ICD-10-CM | POA: Diagnosis not present

## 2021-10-10 DIAGNOSIS — F419 Anxiety disorder, unspecified: Secondary | ICD-10-CM | POA: Diagnosis not present

## 2021-10-10 DIAGNOSIS — E1021 Type 1 diabetes mellitus with diabetic nephropathy: Secondary | ICD-10-CM | POA: Diagnosis not present

## 2021-10-10 DIAGNOSIS — E10319 Type 1 diabetes mellitus with unspecified diabetic retinopathy without macular edema: Secondary | ICD-10-CM | POA: Diagnosis not present

## 2021-10-10 DIAGNOSIS — Z794 Long term (current) use of insulin: Secondary | ICD-10-CM | POA: Diagnosis not present

## 2021-10-10 DIAGNOSIS — Z800 Family history of malignant neoplasm of digestive organs: Secondary | ICD-10-CM | POA: Diagnosis not present

## 2021-10-10 DIAGNOSIS — I152 Hypertension secondary to endocrine disorders: Secondary | ICD-10-CM | POA: Diagnosis not present

## 2021-10-10 DIAGNOSIS — Z803 Family history of malignant neoplasm of breast: Secondary | ICD-10-CM | POA: Diagnosis not present

## 2021-10-10 DIAGNOSIS — D75839 Thrombocytosis, unspecified: Secondary | ICD-10-CM | POA: Diagnosis not present

## 2021-10-10 DIAGNOSIS — E1069 Type 1 diabetes mellitus with other specified complication: Secondary | ICD-10-CM | POA: Diagnosis not present

## 2021-10-10 DIAGNOSIS — N179 Acute kidney failure, unspecified: Secondary | ICD-10-CM | POA: Diagnosis not present

## 2021-10-10 DIAGNOSIS — E1065 Type 1 diabetes mellitus with hyperglycemia: Secondary | ICD-10-CM | POA: Diagnosis not present

## 2021-10-10 DIAGNOSIS — R112 Nausea with vomiting, unspecified: Secondary | ICD-10-CM | POA: Diagnosis not present

## 2021-10-10 DIAGNOSIS — F32A Depression, unspecified: Secondary | ICD-10-CM | POA: Diagnosis present

## 2021-10-10 DIAGNOSIS — Z8249 Family history of ischemic heart disease and other diseases of the circulatory system: Secondary | ICD-10-CM | POA: Diagnosis not present

## 2021-10-10 LAB — CBC
HCT: 37.4 % (ref 36.0–46.0)
Hemoglobin: 12 g/dL (ref 12.0–15.0)
MCH: 28.4 pg (ref 26.0–34.0)
MCHC: 32.1 g/dL (ref 30.0–36.0)
MCV: 88.4 fL (ref 80.0–100.0)
Platelets: 385 10*3/uL (ref 150–400)
RBC: 4.23 MIL/uL (ref 3.87–5.11)
RDW: 13.4 % (ref 11.5–15.5)
WBC: 10.8 10*3/uL — ABNORMAL HIGH (ref 4.0–10.5)
nRBC: 0 % (ref 0.0–0.2)

## 2021-10-10 LAB — COMPREHENSIVE METABOLIC PANEL
ALT: 14 U/L (ref 0–44)
AST: 14 U/L — ABNORMAL LOW (ref 15–41)
Albumin: 3 g/dL — ABNORMAL LOW (ref 3.5–5.0)
Alkaline Phosphatase: 70 U/L (ref 38–126)
Anion gap: 12 (ref 5–15)
BUN: 17 mg/dL (ref 6–20)
CO2: 24 mmol/L (ref 22–32)
Calcium: 9.4 mg/dL (ref 8.9–10.3)
Chloride: 106 mmol/L (ref 98–111)
Creatinine, Ser: 1.07 mg/dL — ABNORMAL HIGH (ref 0.44–1.00)
GFR, Estimated: >60 mL/min (ref >60)
Glucose, Bld: 259 mg/dL — ABNORMAL HIGH (ref 70–99)
Potassium: 4 mmol/L (ref 3.5–5.1)
Sodium: 142 mmol/L (ref 135–145)
Total Bilirubin: 1.3 mg/dL — ABNORMAL HIGH (ref 0.3–1.2)
Total Protein: 7 g/dL (ref 6.5–8.1)

## 2021-10-10 LAB — CBG MONITORING, ED
Glucose-Capillary: 265 mg/dL — ABNORMAL HIGH (ref 70–99)
Glucose-Capillary: 269 mg/dL — ABNORMAL HIGH (ref 70–99)

## 2021-10-10 LAB — GLUCOSE, CAPILLARY
Glucose-Capillary: 225 mg/dL — ABNORMAL HIGH (ref 70–99)
Glucose-Capillary: 54 mg/dL — ABNORMAL LOW (ref 70–99)
Glucose-Capillary: 140 mg/dL — ABNORMAL HIGH (ref 70–99)

## 2021-10-10 MED ORDER — HYDROMORPHONE HCL 2 MG/ML IJ SOLN
1.0000 mg | Freq: Once | INTRAMUSCULAR | Status: AC
  Administered 2021-10-10: 1 mg via INTRAVENOUS
  Filled 2021-10-10: qty 1

## 2021-10-10 MED ORDER — SODIUM CHLORIDE 0.9 % IV SOLN
INTRAVENOUS | Status: DC
Start: 2021-10-10 — End: 2021-10-11

## 2021-10-10 MED ORDER — SODIUM CHLORIDE 0.9 % IV BOLUS
1000.0000 mL | Freq: Once | INTRAVENOUS | Status: AC
  Administered 2021-10-10: 1000 mL via INTRAVENOUS

## 2021-10-10 MED ORDER — HYDROMORPHONE HCL 1 MG/ML IJ SOLN
1.0000 mg | Freq: Once | INTRAMUSCULAR | Status: AC
  Administered 2021-10-10: 1 mg via INTRAVENOUS
  Filled 2021-10-10: qty 1

## 2021-10-10 NOTE — ED Notes (Signed)
Patient ambulated to the restroom without assistance 

## 2021-10-10 NOTE — ED Notes (Signed)
This RN went in to give the patient her Dilaudid. I informed her that this would be the last dose of Dilaudid to be given to her, per Dr. Alinda Money, and the patient asked "Can you push it in faster than normal?" This RN informed her I would push it in at the safe rate.

## 2021-10-10 NOTE — ED Notes (Signed)
Pt called out asking for more Dilaudid. This RN reminded her that the doctor has told her he will not be ordering more Dilaudid.

## 2021-10-10 NOTE — Patient Outreach (Signed)
  Care Coordination   Multidisciplinary case review  Visit Note   10/10/2021 Name: Kelli Hudson MRN: [id number] DOB: January 26, 1991  Kelli Hudson is a 31 y.o. year old female who sees Kelli Hudson for primary care. I  Collaborated with the multi-disciplinary team to review patient care needs and barriers.  What matters to the patients health and wellness today?  No outreach to patient.     Goals Addressed   None     SDOH assessments and interventions completed:  No     Care Coordination Interventions Activated:  No  Care Coordination Interventions:  No, not indicated   Follow up plan:  10/14/21 at 1:30pm    Encounter Outcome:  Pt. Scheduled   George Ina RN,BSN,CCM RN Care Manager Coordinator [phone]

## 2021-10-10 NOTE — Progress Notes (Signed)
PROGRESS NOTE    Kelli Hudson  ZOX:[id number] DOB: 1990-12-26 DOA: 10/09/2021 PCP: Loyola Mast, MD    Brief Narrative:  31 year old female with unfortunate history of severe gastroparesis syndrome, type 1 diabetes on insulin, neuropathy, nephropathy, GERD, depression anxiety presented to the hospital with another episode of abdominal pain, nausea and vomiting.  She was discharged 2 days ago after 1 week of hospitalization.  She went home and very next day started having symptoms so came back to the emergency room. In the emergency room tachycardic, electrolytes stable.  Lipase normal.  With persistent symptoms she was admitted to the hospital.   Assessment & Plan:   Intractable nausea and vomiting, exacerbation of gastroparesis syndrome. She has severe persistent symptoms. IV fluid boluses given in the ER.  Continue maintenance IV fluid. Scheduled metoclopramide every 6 hours. Diabetic diet prescribed, however she is not able to eat, she will keep on sips of water or any liquid meal that makes her comfortable. Intermittent doses of IV Dilaudid, however trying to avoid.  Nonopiate pain relief with Toradol and muscle relaxants. As needed Compazine. Previously treated with IV erythromycin.  Will monitor symptoms on a scheduled metoclopramide today.  Type 1 diabetes with complications: Continue 16 units of long-acting insulin per sliding scale insulin.  Will not restart any prandial insulin as she does not have adequate intake.  Essential hypertension: Blood pressures low normal.  Resume Coreg to prevent rebound tachycardia.  GERD: Resume PPI.  Depression and anxiety: As needed hydroxyzine.  Hyperlipidemia: Atorvastatin.   DVT prophylaxis: enoxaparin (LOVENOX) injection 40 mg Start: 10/10/21 1000   Code Status: Full code Family Communication: None Disposition Plan: Status is: Observation The patient will require care spanning > 2 midnights and should be moved to  inpatient because: Severe persistent abdomen pain and vomiting.  Intolerance to oral intake.     Consultants:  None  Procedures:  None  Antimicrobials:  None   Subjective: Patient seen and examined.  Continues to complain of epigastric pain.  She is asking for 1 dose of Dilaudid. We discussed about opiate use and possible exacerbation of gastroparesis with ongoing use of opiates. Patient tells me that only Dilaudid is going to help her symptoms. Unfortunately, she does suffer from serious intolerance nausea vomiting that results in abdominal pain.  Objective: Vitals:   10/10/21 0600 10/10/21 0616 10/10/21 1005 10/10/21 1045  BP: (!) 156/105 (!) 159/109  (!) 82/52  Pulse:  (!) 140 (!) 121 (!) 113  Resp: (!) 5 13 15 16   Temp:  98.2 F (36.8 C) 97.8 F (36.6 C)   TempSrc:  Oral Oral   SpO2:  100% 99% 96%    Intake/Output Summary (Last 24 hours) at 10/10/2021 1115 Last data filed at 10/09/2021 2024 Gross per 24 hour  Intake 2000 ml  Output --  Net 2000 ml   There were no vitals filed for this visit.  Examination:  General: Anxious.  In moderate pain and discomfort. Cardiovascular: S1 and S2 normal.  Regular rate rhythm.  Tachycardic. Respiratory: Bilateral clear.  No added sounds. Gastrointestinal: Soft.  Mild tenderness with some voluntary guarding in the epigastrium and right upper quadrant. Ext: No edema or swelling.  No cyanosis.  No deformities. Neuro: Intact     Data Reviewed: I have personally reviewed following labs and imaging studies  CBC: Recent Labs  Lab 10/04/21 1227 10/05/21 0519 10/09/21 1742 10/10/21 0523  WBC 9.6 9.9 8.9 10.8*  NEUTROABS  --   --  5.6  --  HGB 12.6 10.8* 13.6 12.0  HCT 38.3 33.8* 41.4 37.4  MCV 87.4 90.6 88.1 88.4  PLT 472* 351 481* 0000000   Basic Metabolic Panel: Recent Labs  Lab 10/04/21 1227 10/05/21 0519 10/06/21 0447 10/09/21 1837 10/10/21 0523  NA 134* 138 135 140 142  K 3.8 3.7 4.1 3.9 4.0  CL 100 106 106  107 106  CO2 24 23 24 24 24   GLUCOSE 374* 181* 133* 214* 259*  BUN 24* 24* 14 15 17   CREATININE 1.23* 1.16* 0.95 1.15* 1.07*  CALCIUM 9.8 9.2 9.1 9.8 9.4   GFR: Estimated Creatinine Clearance: 85.3 mL/min (A) (by C-G formula based on SCr of 1.07 mg/dL (H)). Liver Function Tests: Recent Labs  Lab 10/04/21 1227 10/05/21 0519 10/09/21 1837 10/10/21 0523  AST 22 16 17  14*  ALT 22 16 15 14   ALKPHOS 92 65 72 70  BILITOT 1.3* 0.9 1.3* 1.3*  PROT 8.4* 6.6 6.9 7.0  ALBUMIN 3.8 3.0* 3.3* 3.0*   Recent Labs  Lab 10/04/21 1227 10/09/21 1837  LIPASE 24 23   No results for input(s): "AMMONIA" in the last 168 hours. Coagulation Profile: No results for input(s): "INR", "PROTIME" in the last 168 hours. Cardiac Enzymes: No results for input(s): "CKTOTAL", "CKMB", "CKMBINDEX", "TROPONINI" in the last 168 hours. BNP (last 3 results) No results for input(s): "PROBNP" in the last 8760 hours. HbA1C: No results for input(s): "HGBA1C" in the last 72 hours. CBG: Recent Labs  Lab 10/06/21 1607 10/06/21 2128 10/07/21 0749 10/07/21 1144 10/10/21 0827  GLUCAP 264* 100* 157* 172* 265*   Lipid Profile: No results for input(s): "CHOL", "HDL", "LDLCALC", "TRIG", "CHOLHDL", "LDLDIRECT" in the last 72 hours. Thyroid Function Tests: No results for input(s): "TSH", "T4TOTAL", "FREET4", "T3FREE", "THYROIDAB" in the last 72 hours. Anemia Panel: No results for input(s): "VITAMINB12", "FOLATE", "FERRITIN", "TIBC", "IRON", "RETICCTPCT" in the last 72 hours. Sepsis Labs: No results for input(s): "PROCALCITON", "LATICACIDVEN" in the last 168 hours.  Recent Results (from the past 240 hour(s))  Culture, blood (routine x 2)     Status: None   Collection Time: 10/04/21 12:27 PM   Specimen: BLOOD  Result Value Ref Range Status   Specimen Description   Final    BLOOD BLOOD RIGHT ARM Performed at [facility] [address] [place], [place] [address]    Special Requests   Final     BOTTLES DRAWN AEROBIC AND ANAEROBIC Blood Culture results may not be optimal due to an excessive volume of blood received in culture bottles Performed at [facility] [address] [place], [place] [address]    Culture   Final    NO GROWTH 5 DAYS Performed at [facility], [place] [address], [place], [place] [address]    Report Status 10/09/2021 FINAL  Final  Culture, blood (routine x 2)     Status: Abnormal   Collection Time: 10/05/21  5:19 AM   Specimen: BLOOD  Result Value Ref Range Status   Specimen Description   Final    BLOOD BLOOD LEFT HAND Performed at [facility] [address], [place], [place] [address]    Special Requests   Final    BOTTLES DRAWN AEROBIC ONLY Blood Culture adequate volume Performed at [facility] [address], [place], [place] [address]    Culture  Setup Time   Final    GRAM POSITIVE RODS AEROBIC BOTTLE ONLY CRITICAL RESULT CALLED TO, READ BACK BY AND  VERIFIED WITH: PHARMD M.STWYNE AT 0818 ON 10/07/2021 BY T.SAAD.    Culture (A)  Final    DIPHTHEROIDS(CORYNEBACTERIUM SPECIES) Standardized susceptibility testing for this organism is not available. Performed at [facility], [address] [place], [place] [address]    Report Status 10/08/2021 FINAL  Final         Radiology Studies: DG Chest Portable 1 View  Result Date: 10/09/2021 CLINICAL DATA:  Chest pain EXAM: PORTABLE CHEST 1 VIEW COMPARISON:  06/04/2021 FINDINGS: The heart size and mediastinal contours are within normal limits. Both lungs are clear. The visualized skeletal structures are unremarkable. IMPRESSION: Normal study. Electronically Signed   By: Charlett Nose M.D.   On: 10/09/2021 17:19        Scheduled Meds:  atorvastatin  20 mg Oral Daily   carvedilol  6.25 mg Oral BID WC   enoxaparin (LOVENOX) injection  40 mg Subcutaneous Q24H   insulin aspart  0-15 Units Subcutaneous TID WC   insulin detemir  16  Units Subcutaneous Daily   metoCLOPramide (REGLAN) injection  10 mg Intravenous Q6H   pantoprazole  40 mg Oral BID AC   sodium chloride flush  3 mL Intravenous Q12H   Continuous Infusions:  sodium chloride       LOS: 0 days    Time spent: 35 minutes    Dorcas Carrow, MD [facility] Pager [phone]

## 2021-10-10 NOTE — ED Notes (Signed)
Pt CBG 269. Glucometer would not dock.

## 2021-10-10 NOTE — ED Notes (Signed)
MD notified of hypotension

## 2021-10-10 NOTE — Inpatient Diabetes Management (Signed)
Inpatient Diabetes Program Recommendations  AACE/ADA: New Consensus Statement on Inpatient Glycemic Control (2015)  Target Ranges:  Prepandial:   less than 140 mg/dL      Peak postprandial:   less than 180 mg/dL (1-2 hours)      Critically ill patients:  140 - 180 mg/dL   Lab Results  Component Value Date   GLUCAP 265 (H) 10/10/2021   HGBA1C 10.3 (H) 10/06/2021    Review of Glycemic Control  Latest Reference Range & Units 10/10/21 08:27  Glucose-Capillary 70 - 99 mg/dL 903 (H)  (H): Data is abnormally high Diabetes history: Type 1 DM Outpatient Diabetes medications: Levemir 16 units QD, novolin R 0-15 units TID Current orders for Inpatient glycemic control: Novolog 0-15 units TID, Levemir 16 units QD  Inpatient Diabetes Program Recommendations:    With diet order and patient a type 1 Dm, consider also adding Novolog 3 units TID (assuming patient is consuming >50% of meals).   Thanks, Lujean Rave, MSN, RNC-OB Diabetes Coordinator [phone] (8a-5p)

## 2021-10-10 NOTE — ED Notes (Signed)
Pt was given compazine and while giving it she asked for more pain meds. I advised her what the MD had mentioned earlier about not giving anymore and advised her when I could give Toradol next.

## 2021-10-11 ENCOUNTER — Other Ambulatory Visit: Payer: Self-pay

## 2021-10-11 LAB — CBC WITH DIFFERENTIAL/PLATELET
Abs Immature Granulocytes: 0.05 10*3/uL (ref 0.00–0.07)
Basophils Absolute: 0.1 10*3/uL (ref 0.0–0.1)
Basophils Relative: 1 %
Eosinophils Absolute: 0.3 10*3/uL (ref 0.0–0.5)
Eosinophils Relative: 2 %
HCT: 30.5 % — ABNORMAL LOW (ref 36.0–46.0)
Hemoglobin: 10 g/dL — ABNORMAL LOW (ref 12.0–15.0)
Immature Granulocytes: 0 %
Lymphocytes Relative: 45 %
Lymphs Abs: 5.8 10*3/uL — ABNORMAL HIGH (ref 0.7–4.0)
MCH: 28.8 pg (ref 26.0–34.0)
MCHC: 32.8 g/dL (ref 30.0–36.0)
MCV: 87.9 fL (ref 80.0–100.0)
Monocytes Absolute: 0.8 10*3/uL (ref 0.1–1.0)
Monocytes Relative: 7 %
Neutro Abs: 5.7 10*3/uL (ref 1.7–7.7)
Neutrophils Relative %: 45 %
Platelets: 327 10*3/uL (ref 150–400)
RBC: 3.47 MIL/uL — ABNORMAL LOW (ref 3.87–5.11)
RDW: 13.8 % (ref 11.5–15.5)
WBC: 12.7 10*3/uL — ABNORMAL HIGH (ref 4.0–10.5)
nRBC: 0 % (ref 0.0–0.2)

## 2021-10-11 LAB — COMPREHENSIVE METABOLIC PANEL
ALT: 12 U/L (ref 0–44)
AST: 15 U/L (ref 15–41)
Albumin: 2.8 g/dL — ABNORMAL LOW (ref 3.5–5.0)
Alkaline Phosphatase: 56 U/L (ref 38–126)
Anion gap: 7 (ref 5–15)
BUN: 23 mg/dL — ABNORMAL HIGH (ref 6–20)
CO2: 22 mmol/L (ref 22–32)
Calcium: 8.8 mg/dL — ABNORMAL LOW (ref 8.9–10.3)
Chloride: 109 mmol/L (ref 98–111)
Creatinine, Ser: 1.52 mg/dL — ABNORMAL HIGH (ref 0.44–1.00)
GFR, Estimated: 47 mL/min — ABNORMAL LOW (ref >60)
Glucose, Bld: 105 mg/dL — ABNORMAL HIGH (ref 70–99)
Potassium: 4 mmol/L (ref 3.5–5.1)
Sodium: 138 mmol/L (ref 135–145)
Total Bilirubin: 1 mg/dL (ref 0.3–1.2)
Total Protein: 5.5 g/dL — ABNORMAL LOW (ref 6.5–8.1)

## 2021-10-11 LAB — GLUCOSE, CAPILLARY
Glucose-Capillary: 89 mg/dL (ref 70–99)
Glucose-Capillary: 186 mg/dL — ABNORMAL HIGH (ref 70–99)
Glucose-Capillary: 171 mg/dL — ABNORMAL HIGH (ref 70–99)
Glucose-Capillary: 173 mg/dL — ABNORMAL HIGH (ref 70–99)
Glucose-Capillary: 137 mg/dL — ABNORMAL HIGH (ref 70–99)

## 2021-10-11 LAB — MAGNESIUM: Magnesium: 1.8 mg/dL (ref 1.7–2.4)

## 2021-10-11 LAB — PHOSPHORUS: Phosphorus: 4.2 mg/dL (ref 2.5–4.6)

## 2021-10-11 MED ORDER — ADULT MULTIVITAMIN W/MINERALS CH: 1.0000 | ORAL_TABLET | Freq: Every day | ORAL | Status: DC

## 2021-10-11 MED ORDER — METOPROLOL TARTRATE 5 MG/5ML IV SOLN
5.0000 mg | Freq: Once | INTRAVENOUS | Status: AC
  Administered 2021-10-11: 5 mg via INTRAVENOUS
  Filled 2021-10-11: qty 5

## 2021-10-11 MED ORDER — ACETAMINOPHEN 10 MG/ML IV SOLN
1000.0000 mg | Freq: Four times a day (QID) | INTRAVENOUS | Status: AC | PRN
Start: 2021-10-11 — End: 2021-10-12
  Administered 2021-10-11 – 2021-10-12 (×3): 1000 mg via INTRAVENOUS
  Filled 2021-10-11 (×3): qty 100

## 2021-10-11 MED ORDER — GLUCERNA SHAKE PO LIQD
237.0000 mL | Freq: Two times a day (BID) | ORAL | Status: DC
  Administered 2021-10-11: 237 mL via ORAL
  Filled 2021-10-11 (×7): qty 237

## 2021-10-11 MED ORDER — SODIUM CHLORIDE 0.9 % IV SOLN: INTRAVENOUS | Status: DC

## 2021-10-11 MED ORDER — GABAPENTIN 100 MG PO CAPS
100.0000 mg | ORAL_CAPSULE | Freq: Three times a day (TID) | ORAL | Status: DC | PRN
  Administered 2021-10-11 – 2021-10-14 (×4): 100 mg via ORAL
  Filled 2021-10-11 (×6): qty 1

## 2021-10-11 MED ORDER — HYDRALAZINE HCL 20 MG/ML IJ SOLN
10.0000 mg | INTRAMUSCULAR | Status: DC | PRN
Start: 2021-10-11 — End: 2021-10-14
  Administered 2021-10-11 (×2): 10 mg via INTRAVENOUS
  Filled 2021-10-11 (×2): qty 1

## 2021-10-11 MED ORDER — ALUM & MAG HYDROXIDE-SIMETH 200-200-20 MG/5ML PO SUSP
30.0000 mL | Freq: Once | ORAL | Status: AC
  Administered 2021-10-11: 30 mL via ORAL
  Filled 2021-10-11: qty 30

## 2021-10-11 MED ORDER — MELATONIN 3 MG PO TABS
3.0000 mg | ORAL_TABLET | Freq: Once | ORAL | Status: AC
  Administered 2021-10-11: 3 mg via ORAL
  Filled 2021-10-11: qty 1

## 2021-10-11 MED ORDER — SODIUM CHLORIDE 0.9 % IV BOLUS
1000.0000 mL | Freq: Once | INTRAVENOUS | Status: AC
  Administered 2021-10-11: 1000 mL via INTRAVENOUS

## 2021-10-11 MED ORDER — METHOCARBAMOL 500 MG PO TABS
500.0000 mg | ORAL_TABLET | Freq: Three times a day (TID) | ORAL | Status: DC | PRN
  Administered 2021-10-11 (×2): 500 mg via ORAL
  Filled 2021-10-11 (×2): qty 1

## 2021-10-11 NOTE — Significant Event (Signed)
Rapid Response Event Note:  Reason for Call :  Chest pain and shortness of breath  Initial Focused Assessment:  Patient resting in bed, fatigued. I was called due to shortness of breath and chest pain. BP 183/102, HR 138. Patient placed on 2L [place] and 12 lead completed. No cardiac abnormalities outsice of prolonged QTc and sinus tachy. Patient had just ambulated with PT in hallway. Very fatigued having her family answer questions for her. It is currently too early to administer her Hydralazine. Patient now vomiting, compazine administered. Mother- POA called on the phone irate and verbally aggressive to staff regarding patient needing pain medication. Patient reports she was told she can not have Dilaudid as requested because she was told she was "addicted." [facility] and leadership notified.  Interventions:  2L [place] placed 12 Lead EKG completed MD communication CBG completed- 171  Plan of Care:  MD adding Gabapentin to try and help, avoiding narcotics due to gastroparesis. Family at bedside calling regarding switching providers.   Event Summary:  MD Notified: 10/11/21 Call Time: 1500 Arrival Time: 1505 End Time: 1535  Lynden Oxford, RN

## 2021-10-11 NOTE — Consult Note (Addendum)
Attending physician's note   I have performed an in depth review of the chart and discussed her care on rounds.  I agree with the APP's note, impression, and recommendations with my edits.   31 yo female with medical history as outlined below, to include hx of DM1 c/b severe gastroparesis, neuropathy, and nephropathy, admitted with abdominal pain and n/v. No role for Dilaudid, but instead need to maximize medications for underlying gastroparesis.  ECG reviewed and unfortunately, and QTc prolonged on arrival (546) and still with QTc prolongation of 588 today.  Interestingly, QTc was normal on 09/20/2021 (440), but elevated in the ER on 8/23, and again yesterday and today.  This unfortunately limits some of our therapeutic options, particularly Reglan, erythromycin, Zofran, Phenergan.  Could consider trial of Motegrity if she has not tried this as this has demonstrated effect on improved gastric motility.  Additionally, patient has been previously offered domperidone, with plan to obtain Rx from a [facility] for $60/month, but patient was unwilling at that time. Perhaps she is able to reconsider.  Additionally, Dr. Havery Moros has previously placed referrals to each of the academic centers [facility], [place], [facility]), and she has been declined at each of those centers.  Does not appear that [facility] is performing gastric pacemaker placement anymore.  Could be worth exploring if one of the other academic centers performed gastric pacemaker and/or pyloromyotomy for refractory gastroparesis.   Recommendations for inpatient care include Compazine, trialing Motegrity (if available in inpatient pharmacy), gastroparesis diet. May benefit from outpatient accupuncture and possibly referral to Pain Management service for further evaluation and assistance, particularly if any MSK type component to pain.    Dr. Collene Mares will assume her inpatient GI care for the weekend.    [address] DO, [place] [phone] office          Consultation  Referring Provider:   Dr. Jamse Arn Primary Care Physician:  [name], MD Primary Gastroenterologist:  Dr. Luvenia Starch       Reason for Consultation:     Irretractable nausea vomiting, abdominal pain and known setting of severe gastroparesis   Impression    Abdominal pain with nausea and vomiting in setting of known severe gastroparesis Episodes of abdominal pain usually or after severe vomiting Positive Carnett's sign on examination, some chest wall tenderness to palpation Question some muscular component to abdominal pain  History of prolonged QT EKG today appears to have prolonged QT at 588. Patient is on hydroxyzine for anxiety  Type 1 diabetes on insulin pump Most recent A1c 10.8    Plan   Hospitalizations in 2 months for abdominal pain, nausea vomiting and known severe gastroparesis with uncontrolled A1c. Ideally patient would go to tertiary center however she has been referred to [facility], [place], and [facility] and declined from all 3. Patient's had history of intermittent QT prolongation also limiting medical therapy. Can do trial of Motegrity 2 mg daily patient is in the hospital is just been shown to help with some gastroparesis. QT prolongation so cannot use erythromycin IV versus Reglan use, consider starting if QT prolongation discontinues. Consider switching hydroxyzine. We will need serial EKGs or telemetry. Continue Compazine, PPI twice daily. Note again patient's had multiple CT scans with possible colitis picture however has no diarrhea, constipation or hematochezia, will try to set up for outpatient colonoscopy as patient cannot tolerate bowel prep currently. -Emphasized need to get sugars under better control in order to help improve gastroparesis. -Patient also had  positive Carnett's sign, can consider lidocaine, IV Ativan for possible muscular component.  Can switch to oral Flexeril and patient is able to tolerate  p.o. -Would suggest patient get office visit with our office prior to discharge, to assure outpatient follow-up.   Thank you for your kind consultation, we will continue to follow.         HPI:   Kelli Hudson is a 31 y.o. female with past medical history significant for type 1 diabetes on insulin pump, hemoglobin ranges 9-14's, known beer gastroparesis from [facility] March 2022, presents for irretractable nausea vomiting and 2 to 3 days of abdominal pain.  Patient well-known to Dr. Havery Moros, recent admission 10/2021 for similar presentation other than coffee-ground emesis. 08/26/2021 upper endoscopy completely normal esophagus, stomach, duodenum, questionable pyloric spasm injected with Botox 08/24/2021 CT abdomen pelvis with contrast for abdominal pain showed mild diffuse colonic wall thickening with surrounding fat stranding suggesting colitis prominent appendix with no surrounding and laboratory changes patient is never had colonoscopy. No diarrhea, constipation, hematochezia.  Patient mom also on the phone, appears patient's been having vomiting since discharge, intermittently, worse in the morning.  Describes episode of eating chips and muffin day prior with vomiting of the chips and muffin the following morning. Patient was on Reglan outpatient 3 times daily without benefit. Patient did not have any abdominal pain until Wednesday afternoon after entire day of severe got retching vomiting. Patient denies alcohol, drug use, NSAIDs. Has bowel movements daily, no melena or hematochezia.  Well formed stools without diarrhea or constipation. Patient has had shortness of breath cough, some mild sputum with this and chest discomfort since Wednesday.   08/17/2021 gastroenteritis similar to prior studies no small bowel obstruction or inflammation, distended and partially stool-filled appendix similar to prior studies, ascending, transverse and descending colitis versus nondistention. Patient  was referred to weight, [facility] and [facility] inquiring about gastric pacemaker program for evaluation for pyloromyotomy, she has been declined at all center.   Patient has history of gallstones however no biliary colic or biliary dilatation.  Enzymes normal. This is patient's fifth hospitalization in 2 months. Was given Motegrity previous visit.  This to consider domperidone outpatient with normal QT.  CBC 10/10/2021 show white blood cell count 10.8, 10/11/2021 white blood cell count 12.7, no left shift.  Hemoglobin 10, MCV 87.9. BUN 23, creatinine 1.52, AST 15, ALT 12, alk phos 56.  Abnormal ED labs: Abnormal Labs Reviewed  CBC WITH DIFFERENTIAL/PLATELET - Abnormal; Notable for the following components:      Result Value   Platelets 481 (*)    All other components within normal limits  URINALYSIS, ROUTINE W REFLEX MICROSCOPIC - Abnormal; Notable for the following components:   Glucose, UA >=500 (*)    Hgb urine dipstick SMALL (*)    Ketones, ur 5 (*)    Protein, ur >=300 (*)    Bacteria, UA RARE (*)    All other components within normal limits  COMPREHENSIVE METABOLIC PANEL - Abnormal; Notable for the following components:   Glucose, Bld 214 (*)    Creatinine, Ser 1.15 (*)    Albumin 3.3 (*)    Total Bilirubin 1.3 (*)    All other components within normal limits  COMPREHENSIVE METABOLIC PANEL - Abnormal; Notable for the following components:   Glucose, Bld 259 (*)    Creatinine, Ser 1.07 (*)    Albumin 3.0 (*)    AST 14 (*)    Total Bilirubin 1.3 (*)    All other  components within normal limits  CBC - Abnormal; Notable for the following components:   WBC 10.8 (*)    All other components within normal limits  COMPREHENSIVE METABOLIC PANEL - Abnormal; Notable for the following components:   Glucose, Bld 105 (*)    BUN 23 (*)    Creatinine, Ser 1.52 (*)    Calcium 8.8 (*)    Total Protein 5.5 (*)    Albumin 2.8 (*)    GFR, Estimated 47 (*)    All other components within normal  limits  CBC WITH DIFFERENTIAL/PLATELET - Abnormal; Notable for the following components:   WBC 12.7 (*)    RBC 3.47 (*)    Hemoglobin 10.0 (*)    HCT 30.5 (*)    Lymphs Abs 5.8 (*)    All other components within normal limits  GLUCOSE, CAPILLARY - Abnormal; Notable for the following components:   Glucose-Capillary 225 (*)    All other components within normal limits  GLUCOSE, CAPILLARY - Abnormal; Notable for the following components:   Glucose-Capillary 54 (*)    All other components within normal limits  GLUCOSE, CAPILLARY - Abnormal; Notable for the following components:   Glucose-Capillary 140 (*)    All other components within normal limits  GLUCOSE, CAPILLARY - Abnormal; Notable for the following components:   Glucose-Capillary 186 (*)    All other components within normal limits  GLUCOSE, CAPILLARY - Abnormal; Notable for the following components:   Glucose-Capillary 171 (*)    All other components within normal limits  CBG MONITORING, ED - Abnormal; Notable for the following components:   Glucose-Capillary 265 (*)    All other components within normal limits  CBG MONITORING, ED - Abnormal; Notable for the following components:   Glucose-Capillary 269 (*)    All other components within normal limits     Past Medical History:  Diagnosis Date   Acute H. 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 Family History:  Her family history includes Bipolar disorder in her father; Breast cancer in an other family member; Diabetes in her maternal aunt and mother; Heart disease in an other family member; Kidney disease in an other family member; Liver cancer in her maternal grandfather; Lung cancer in her mother; Ovarian cancer in an other family member; Pancreatic cancer in her maternal uncle; Prostate cancer in her maternal uncle. Social History:   reports that she has never smoked. She has never used smokeless tobacco. She reports current alcohol use of about 1.0 standard drink of alcohol per week. She reports that she does not use drugs.  Prior to Admission medications   Medication Sig Start Date End Date Taking? Authorizing Provider  atorvastatin (LIPITOR) 20 MG tablet Take 1 tablet (20 mg total) by mouth daily. 04/10/21  Yes [name], MD  carvedilol (COREG) 6.25 MG tablet Take 1 tablet (6.25 mg total) by mouth 2 (two) times daily with a meal. 07/01/21  Yes Nita Sells, MD  erythromycin base (E-MYCIN) 500 MG tablet Take 1 tablet (500 mg total) by mouth 3 (three) times daily for 10 days. 10/07/21 10/17/21 Yes Kathie Dike, MD  hydrOXYzine (VISTARIL) 25 MG capsule Take 1 capsule (25 mg total) by mouth every 8 (eight) hours as needed. Patient taking differently: Take 25 mg by mouth every 8 (eight) hours as needed for anxiety. 07/31/21  Yes Georgette Shell, MD  insulin detemir (LEVEMIR FLEXTOUCH) 100 UNIT/ML FlexPen Inject 16 Units into the skin daily. 06/14/21  Yes Shelly Coss, MD  insulin regular (NOVOLIN R) 100 units/mL injection Inject 0-0.15 mLs (0-15 Units total) into the skin See admin instructions. Via pump.Start this medication after you follow up with your PCP Patient taking differently: Inject 0-15 Units into the skin See admin instructions. Using sliding scale 06/14/21  Yes Shelly Coss, MD  metoCLOPramide  (REGLAN) 10 MG tablet Take 1 tablet (10 mg total) by mouth 3 (three) times daily with meals. 10/07/21 10/07/22 Yes Kathie Dike, MD  pantoprazole (PROTONIX) 40 MG tablet Take 1 tablet (40 mg total) by mouth 2 (two) times daily before a meal. 08/19/21  Yes Danford, Suann Larry, MD  prochlorperazine (COMPAZINE) 10 MG tablet Take 1/2-1 tablet (5-10 mg total) by mouth every 8 (eight) hours as needed for nausea or vomiting. 10/07/21  Yes Kathie Dike, MD  ondansetron (ZOFRAN) 4 MG tablet Take 1 tablet (4 mg total) by mouth every 8 (eight) hours as needed for nausea or vomiting. Patient not taking: Reported on 10/10/2021 08/27/21 08/27/22  Caren Griffins, MD  insulin aspart (NOVOLOG) 100 UNIT/ML FlexPen Inject 5 Units into the skin 3 (three) times daily with meals. 02/23/18 07/05/19  Donne Hazel, MD    Current Facility-Administered Medications  Medication Dose Route Frequency Provider Last Rate Last Admin   0.9 %  sodium chloride infusion   Intravenous Continuous Barb Merino, MD 100 mL/hr at 10/11/21 1027 New Bag at 10/11/21 1027   acetaminophen (OFIRMEV) IV 1,000 mg  1,000 mg Intravenous Q6H PRN Vashti Hey, MD       acetaminophen (TYLENOL) tablet 650 mg  650 mg Oral Q6H PRN Marcelyn Bruins, MD   650 mg at 10/11/21 1222   Or   acetaminophen (TYLENOL) suppository 650 mg  650 mg Rectal Q6H PRN Marcelyn Bruins, MD       atorvastatin (LIPITOR) tablet 20 mg  20 mg Oral Daily Marcelyn Bruins, MD   20 mg at 10/11/21 1012   carvedilol (COREG) tablet 6.25 mg  6.25 mg Oral BID WC Marcelyn Bruins, MD   6.25 mg at 10/11/21 1012   enoxaparin (LOVENOX) injection 40 mg  40 mg Subcutaneous Q24H Marcelyn Bruins, MD   40 mg at 10/11/21 1013   feeding supplement (GLUCERNA SHAKE) (GLUCERNA SHAKE) liquid 237 mL  237 mL Oral BID BM Bonnell Public Tublu, MD       gabapentin (NEURONTIN) capsule 100 mg  100 mg Oral TID PRN Bonnell Public Tublu, MD       hydrALAZINE  (APRESOLINE) injection 10 mg  10 mg Intravenous Q4H PRN Bonnell Public Tublu, MD   10 mg at 10/11/21 1136   hydrOXYzine (ATARAX) tablet 25 mg  25 mg Oral Q8H PRN Marcelyn Bruins, MD   25 mg at 10/11/21 1222   insulin aspart (novoLOG) injection 0-15 Units  0-15 Units Subcutaneous TID WC Marcelyn Bruins, MD   3 Units at 10/11/21 1312   insulin detemir (  LEVEMIR) injection 16 Units  16 Units Subcutaneous Daily Marcelyn Bruins, MD   16 Units at 10/11/21 1013   ketorolac (TORADOL) 30 MG/ML injection 30 mg  30 mg Intravenous Q6H PRN Marcelyn Bruins, MD   30 mg at 10/11/21 1016   methocarbamol (ROBAXIN) tablet 500 mg  500 mg Oral Q8H PRN Vashti Hey, MD   500 mg at 10/11/21 1134   metoCLOPramide (REGLAN) injection 10 mg  10 mg Intravenous Q6H Marcelyn Bruins, MD   10 mg at 10/11/21 1125   multivitamin with minerals tablet 1 tablet  1 tablet Oral Daily Vashti Hey, MD       pantoprazole (PROTONIX) EC tablet 40 mg  40 mg Oral BID AC Marcelyn Bruins, MD   40 mg at 10/11/21 1012   polyethylene glycol (MIRALAX / GLYCOLAX) packet 17 g  17 g Oral Daily PRN Marcelyn Bruins, MD       prochlorperazine (COMPAZINE) injection 10 mg  10 mg Intravenous Q4H PRN Marcelyn Bruins, MD   10 mg at 10/11/21 1524   sodium chloride 0.9 % bolus 1,000 mL  1,000 mL Intravenous Once Bonnell Public Tublu, MD       sodium chloride flush (NS) 0.9 % injection 3 mL  3 mL Intravenous Q12H Marcelyn Bruins, MD   3 mL at 10/11/21 1017    Allergies as of 10/09/2021   (No Known Allergies)    Review of Systems:    Constitutional: No weight loss, fever, chills, weakness or fatigue HEENT: Eyes: No change in vision               Ears, Nose, Throat:  No change in hearing or congestion Skin: No rash or itching Cardiovascular: No chest pain, chest pressure or palpitations   Respiratory: No SOB or cough Gastrointestinal: See HPI and otherwise negative Genitourinary: No  dysuria or change in urinary frequency Neurological: No headache, dizziness or syncope Musculoskeletal: No new muscle or joint pain Hematologic: No bleeding or bruising Psychiatric: No history of depression or anxiety     Physical Exam:  Vital signs in last 24 hours: Temp:  [97.7 F (36.5 C)-99.6 F (37.6 C)] 99.2 F (37.3 C) (08/25 1521) Pulse Rate:  [93-134] 133 (08/25 1604) Resp:  [12-22] 22 (08/25 1521) BP: (105-190)/(56-120) 182/120 (08/25 1604) SpO2:  [2 %-100 %] 100 % (08/25 1604) Weight:  [78.2 kg] 78.2 kg (08/24 1807) Last BM Date : 10/08/21 Last BM recorded by nurses in past 5 days No data recorded  General:   female in no acute distress Head:  Normocephalic and atraumatic. Eyes: sclerae anicteric,conjunctive pink  Heart:  regular rate and rhythm Pulm: Clear anteriorly; no wheezing Abdomen:  Soft, Non-distended AB, Sluggish bowel sounds. marked tenderness in the entire abdomen to light and deep palpation, + Carnett sign. With guarding and Without rebound, No organomegaly appreciated. Extremities:  Without edema. Msk:  Symmetrical without gross deformities. Peripheral pulses intact.  Neurologic:  Alert and  oriented x4;  No focal deficits.  Skin:   Dry and intact without significant lesions or rashes. Psychiatric:  Cooperative. Normal mood and affect.  LAB RESULTS: Recent Labs    10/09/21 1742 10/10/21 0523 10/11/21 0510  WBC 8.9 10.8* 12.7*  HGB 13.6 12.0 10.0*  HCT 41.4 37.4 30.5*  PLT 481* 385 327   BMET Recent Labs    10/09/21 1837 10/10/21 0523 10/11/21 0510  NA 140 142 138  K 3.9 4.0 4.0  CL  107 106 109  CO2 24 24 22   GLUCOSE 214* 259* 105*  BUN 15 17 23*  CREATININE 1.15* 1.07* 1.52*  CALCIUM 9.8 9.4 8.8*   LFT Recent Labs    10/11/21 0510  PROT 5.5*  ALBUMIN 2.8*  AST 15  ALT 12  ALKPHOS 56  BILITOT 1.0   PT/INR No results for input(s): "LABPROT", "INR" in the last 72 hours.  STUDIES: DG Chest Portable 1 View  Result Date:  10/09/2021 CLINICAL DATA:  Chest pain EXAM: PORTABLE CHEST 1 VIEW COMPARISON:  06/04/2021 FINDINGS: The heart size and mediastinal contours are within normal limits. Both lungs are clear. The visualized skeletal structures are unremarkable. IMPRESSION: Normal study. Electronically Signed   By: Rolm Baptise M.D.   On: 10/09/2021 17:19     Vladimir Crofts  10/11/2021, 4:25 PM

## 2021-10-11 NOTE — Inpatient Diabetes Management (Signed)
Inpatient Diabetes Program Recommendations  AACE/ADA: New Consensus Statement on Inpatient Glycemic Control (2015)  Target Ranges:  Prepandial:   less than 140 mg/dL      Peak postprandial:   less than 180 mg/dL (1-2 hours)      Critically ill patients:  140 - 180 mg/dL   Lab Results  Component Value Date   GLUCAP 89 10/11/2021   HGBA1C 10.3 (H) 10/06/2021    Review of Glycemic Control  Latest Reference Range & Units 10/10/21 17:21 10/10/21 21:39 10/10/21 22:42 10/11/21 07:50  Glucose-Capillary 70 - 99 mg/dL 211 (H) 54 (L) 941 (H) 89  (H): Data is abnormally high (L): Data is abnormally low Diabetes history: Type 1 DM Outpatient Diabetes medications: Levemir 16 units QD, novolin R 0-15 units TID Current orders for Inpatient glycemic control: Novolog 0-15 units TID, Levemir 16 units QD   Inpatient Diabetes Program Recommendations:     Noted poor oral intake and hypoglycemia of 54 mg/dL following correction.  Consider decreasing correction to Novolog 0-6 units TID.   Thanks, Lujean Rave, MSN, RNC-OB Diabetes Coordinator [phone] (8a-5p)

## 2021-10-11 NOTE — Progress Notes (Signed)
PROGRESS NOTE    Kelli Hudson  TGY:[id number]  DOB: 25-Oct-1990  DOA: 10/09/2021 PCP: Loyola Mast, MD Outpatient Specialists:   Hospital course:  31 year old female with DM 1 with gastroparesis and multiple admissions, diabetic neuropathy, nephropathy, GERD, depression anxiety presented to the hospital with another episode of abdominal pain, nausea and vomiting.  She had been discharged 2 days earlier after 1 week of hospitalization.   Subjective:  Multiple discussions with patient and her mother.  Both are very upset that patient has not given IV Dilaudid.  Apparently there is no other medication that works for patient other than IV Dilaudid.  We discussed at length that IV Dilaudid is not indicated for treatment of what appears to be chronic functional abdominal pain.  Mother is very upset that we are suggesting that patient is addicted to narcotics.  She is insistent that her daughter does not have a narcotics addiction as she does not take narcotics at home.  Reviewed that patient is in the hospital very frequently getting IV Dilaudid while in house for relatively long periods of time.  Patient's mother stated that patient is followed by [name] on a regular basis and he knows what medications work for her and she is frustrated that every time patient comes into the hospital she is given medications that do not work for her and that only IV Dilaudid is effective.  Patient's mother is quite upset and rather aggressive.  Patient is tearful and pleading for IV Dilaudid.   Objective: Vitals:   10/11/21 1500 10/11/21 1518 10/11/21 1521 10/11/21 1528  BP:   (!) 183/120 (!) 175/108  Pulse:   (!) 125 (!) 130  Resp: 14 12 (!) 22   Temp:   99.2 F (37.3 C)   TempSrc:   Oral   SpO2:  (!) 2% 100% 99%  Weight:      Height:        Intake/Output Summary (Last 24 hours) at 10/11/2021 1550 Last data filed at 10/11/2021 1026 Gross per 24 hour  Intake 1591 ml  Output 300 ml   Net 1291 ml   Filed Weights   10/10/21 1807  Weight: 78.2 kg     Exam:  General: Patient lying in bed tearful requesting Dilaudid. Eyes: sclera anicteric, conjuctiva mild injection bilaterally CVS: S1-S2, regular  Respiratory:  decreased air entry bilaterally secondary to decreased inspiratory effort, rales at bases  GI: Abdomen is soft.  She has normoactive bowel sounds.  There is no tenderness to light or deep palpation when patient is distracted in any quadrant.  No epigastric tenderness.  No RUQ tenderness.  Murphy's negative.  No suprapubic tenderness.  No rebound. LE: No edema.  Neuro: A/O x 3, Moving all extremities equally with normal strength, CN 3-12 intact, grossly nonfocal.  Psych: Patient is quite tearful and is pleading for Dilaudid.  She affect is quite sad.   Assessment & Plan:   Diabetic gastroparesis Agree with ongoing treatment with Reglan and as needed Compazine for nausea vomiting And decreased p.o. intake, will continue fluid resuscitation  Abdominal Pain Cause of frequent, severe, episodic abdominal pain is not entirely clear to me. Both the patient and her mother are very focused on getting IV Dilaudid as the only modality for pain management. Discussed with both the patient and mother importance of multimodal approach to pain management. Patient's mother is angry because she feels that her daughter is being labeled as potentially being addicted to narcotics when she does not  use p.o. narcotics at home.  She also is angry that that there is no continuity of care--that when patient is admitted to the hospital medications which were not effective in the past are started. Patient notes that she is followed as an outpatient by [name] at [facility] and patient's mother feels that [name] knows what works for her. Will continue hydroxyzine and methocarbamol for pain management. Addition of IV Ofirmev if patient unable to keep p.o. There is no  indication for IV Dilaudid and treatment of chronic functional abdominal pain. [facility] consulted for assistance with diagnosis and management of what appears to be functional abdominal pain vs other diagnosis. Also, patient and family will benefit from continuity of care between inpatient and outpatient settings. In addition, patient may benefit from referral to an outpatient multimodal pain management clinic, discussed with mother and patient.  AKI Likely prerenal secondary to decreased p.o. intake Will add 1 L NS bolus and increase NS infusion to 150 cc an hour x 20 hrs DC Toradol  HTN Blood pressure elevated likely because patient is unable to keep p.o. carvedilol dow. Hydralazine as needed is ordered Patient may benefit from IV metoprolol if unable to control BP with as needed hydralazine  DM 1 Blood sugars under reasonable control on present regimen  DVT prophylaxis: Lovenox Code Status: Full Family Communication: Spoke at length with patient's mother  Disposition Plan:   Patient is from: Home  Anticipated Discharge Location: Home  Barriers to Discharge: Ongoing nausea and vomiting with AKI  Is patient medically stable for Discharge: Not yet   Scheduled Meds:  atorvastatin  20 mg Oral Daily   carvedilol  6.25 mg Oral BID WC   enoxaparin (LOVENOX) injection  40 mg Subcutaneous Q24H   feeding supplement (GLUCERNA SHAKE)  237 mL Oral BID BM   insulin aspart  0-15 Units Subcutaneous TID WC   insulin detemir  16 Units Subcutaneous Daily   metoCLOPramide (REGLAN) injection  10 mg Intravenous Q6H   multivitamin with minerals  1 tablet Oral Daily   pantoprazole  40 mg Oral BID AC   sodium chloride flush  3 mL Intravenous Q12H   Continuous Infusions:  sodium chloride 100 mL/hr at 10/11/21 1027    Data Reviewed:  Basic Metabolic Panel: Recent Labs  Lab 10/05/21 0519 10/06/21 0447 10/09/21 1837 10/10/21 0523 10/11/21 0510  NA 138 135 140 142 138  K 3.7 4.1 3.9 4.0  4.0  CL 106 106 107 106 109  CO2 23 24 24 24 22   GLUCOSE 181* 133* 214* 259* 105*  BUN 24* 14 15 17  23*  CREATININE 1.16* 0.95 1.15* 1.07* 1.52*  CALCIUM 9.2 9.1 9.8 9.4 8.8*  MG  --   --   --   --  1.8  PHOS  --   --   --   --  4.2    CBC: Recent Labs  Lab 10/05/21 0519 10/09/21 1742 10/10/21 0523 10/11/21 0510  WBC 9.9 8.9 10.8* 12.7*  NEUTROABS  --  5.6  --  5.7  HGB 10.8* 13.6 12.0 10.0*  HCT 33.8* 41.4 37.4 30.5*  MCV 90.6 88.1 88.4 87.9  PLT 351 481* 385 327    Studies: DG Chest Portable 1 View  Result Date: 10/09/2021 CLINICAL DATA:  Chest pain EXAM: PORTABLE CHEST 1 VIEW COMPARISON:  06/04/2021 FINDINGS: The heart size and mediastinal contours are within normal limits. Both lungs are clear. The visualized skeletal structures are unremarkable. IMPRESSION: Normal study.  Electronically Signed   By: Charlett Nose M.D.   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

## 2021-10-11 NOTE — Progress Notes (Signed)
Initial Nutrition Assessment  DOCUMENTATION CODES:   Not applicable  INTERVENTION:  - will order Glucerna Shake BID, each supplement provides 220 kcal and 10 grams of protein.  - will order 1 tablet multivitamin with minerals/day.  - added Carbohydrate Counting for People with Diabetes and Gastroparesis Nutrition Therapy handout to AVS.   NUTRITION DIAGNOSIS:   Increased nutrient needs related to acute illness as evidenced by estimated needs.  GOAL:   Patient will meet greater than or equal to 90% of their needs  MONITOR:   PO intake, Supplement acceptance, Labs, Weight trends  REASON FOR ASSESSMENT:   Malnutrition Screening Tool  ASSESSMENT:   31 y.o. female with medical history of type 1 DM with neuropathy, retinopathy, gastroparesis, diabetic ulcers; HTN, GERD, depression, anxiety, anemia, and HLD. She presented to the ED due to ongoing N/V and abdominal pain.  No meal intakes documented this admission.    Weight yesterday was 172 lb, weight on 8/7 was 181 lb, and weight on 7/1 was 172 lb. No information documented in the edema section of flow sheet.   Per notes: - intractable N/V with hx of gastroparesis   Labs reviewed; CBGs: 89 and 186 mg/dl, BUN: 23 mg/dl, creatinine: 0.98 mg/dl, GFR: 47 ml/min.  Medications reviewed; sliding scale novolog, 16 units levemir/day, 10 mg IV reglan QID, 40 mg oral protonix BID.  IVF; NS @ 100 ml/hr.    NUTRITION - FOCUSED PHYSICAL EXAM:  Unable at this time.  Diet Order:   Diet Order             Diet Carb Modified Fluid consistency: Thin; Room service appropriate? Yes  Diet effective now                   EDUCATION NEEDS:   Education needs have been addressed  Skin:  Skin Assessment: Reviewed RN Assessment  Last BM:  PTA/unknown  Height:   Ht Readings from Last 1 Encounters:  10/10/21 5\' 8"  (1.727 m)    Weight:   Wt Readings from Last 1 Encounters:  10/10/21 78.2 kg    BMI:  Body mass index is  26.21 kg/m.  Estimated Nutritional Needs:  Kcal:  1900-2100 kcal Protein:  95-105 grams Fluid:  >/= 2.2 L/day      10/12/21, MS, RD, LDN, CNSC Registered Dietitian II Inpatient Clinical Nutrition RD pager # and on-call/weekend pager # available in [facility]

## 2021-10-11 NOTE — Progress Notes (Signed)
Chaplain introduced spiritual care services to Kelli Hudson and her visitors.  They declined speaking at this time.  Kathleen Argue, Bcc Pager, [phone]

## 2021-10-11 NOTE — TOC Initial Note (Signed)
Transition of Care [facility]) - Initial/Assessment Note    Patient Details  Name: Kelli Hudson MRN: [id number] Date of Birth: Sep 27, 1990  Transition of Care [facility]) CM/SW Contact:    Golda Acre, RN Phone Number: 10/11/2021, 8:07 AM  Clinical Narrative:                  Transition of Care [facility]) Screening Note   Patient Details  Name: Kelli Hudson Date of Birth: 12-25-1990   Transition of Care [facility]) CM/SW Contact:    Golda Acre, RN Phone Number: 10/11/2021, 8:07 AM Patient has been seen by the diabetic coordinator   Transition of Care Department [facility]) has reviewed patient and no TOC needs have been identified at this time. We will continue to monitor patient advancement through interdisciplinary progression rounds. If new patient transition needs arise, please place a TOC consult.    Expected Discharge Plan: Home/Self Care Barriers to Discharge: Continued Medical Work up   Patient Goals and CMS Choice Patient states their goals for this hospitalization and ongoing recovery are:: to go home CMS Medicare.gov Compare Post Acute Care list provided to:: Patient    Expected Discharge Plan and Services Expected Discharge Plan: Home/Self Care   Discharge Planning Services: CM Consult   Living arrangements for the past 2 months: Single Family Home                                      Prior Living Arrangements/Services Living arrangements for the past 2 months: Single Family Home Lives with:: Self Patient language and need for interpreter reviewed:: Yes Do you feel safe going back to the place where you live?: Yes            Criminal Activity/Legal Involvement Pertinent to Current Situation/Hospitalization: No - Comment as needed  Activities of Daily Living Home Assistive Devices/Equipment: Eyeglasses, CBG Meter ADL Screening (condition at time of admission) Patient's cognitive ability adequate to safely complete daily activities?:  Yes Is the patient deaf or have difficulty hearing?: No Does the patient have difficulty seeing, even when wearing glasses/contacts?: No Does the patient have difficulty concentrating, remembering, or making decisions?: No Patient able to express need for assistance with ADLs?: Yes Does the patient have difficulty dressing or bathing?: No Independently performs ADLs?: Yes (appropriate for developmental age) Does the patient have difficulty walking or climbing stairs?: No Weakness of Legs: None Weakness of Arms/Hands: None  Permission Sought/Granted                  Emotional Assessment Appearance:: Appears stated age Attitude/Demeanor/Rapport: Engaged Affect (typically observed): Calm Orientation: : Oriented to Self, Oriented to Place, Oriented to  Time, Oriented to Situation Alcohol / Substance Use: Alcohol Use, Tobacco Use (history of tobacco use, current alcohol use-moderate) Psych Involvement: No (comment)  Admission diagnosis:  Sinus tachycardia [R00.0] Diabetic gastroparesis (HCC) [E11.43, K31.84] Intractable vomiting [R11.10] Intractable nausea and vomiting [R11.2] Patient Active Problem List   Diagnosis Date Noted   Intractable nausea and vomiting 10/09/2021   Drug-seeking behavior 09/21/2021   GERD without esophagitis 08/25/2021   Prolonged Q-T interval on ECG    Ulcerated, foot, left, with fat layer exposed (HCC) 04/20/2021   Uncontrolled type 1 diabetes mellitus with hyperglycemia, with long-term current use of insulin (HCC) 12/18/2020   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) 11/09/2020   Diabetic retinopathy of both eyes  associated with type 1 diabetes mellitus (HCC) 09/24/2020   Diabetic gastroparesis associated with type 1 diabetes mellitus (HCC) 08/12/2020   Diabetic nephropathy associated with type 1 diabetes mellitus (HCC) 06/27/2020   HLD (hyperlipidemia) 05/23/2020   Depression with anxiety 07/05/2019   Diabetic  polyneuropathy associated with type 1 diabetes mellitus (HCC) 09/24/2017   Goiter 10/05/2009   Hypertension associated with diabetes (HCC) 10/05/2009   PCP:  Loyola Mast, MD Pharmacy:   [facility] [phone] - [place], [place] - [address] [facility] [address] [place] [address] Phone: [phone] Fax: [phone]  [facility] [address] [place] [address] Phone: [phone] Fax: [phone]  [email or web address] - [place], [place] - [address] [place] [address] Phone: [phone] Fax: [phone]     Social Determinants of Health (SDOH) Interventions    Readmission Risk Interventions   Row Labels 10/11/2021    8:05 AM 08/27/2021   12:14 PM 06/11/2021    1:01 PM  Readmission Risk Prevention Plan   Section Header. No data exists in this row.     Transportation Screening   Complete Complete Complete  Medication Review Oceanographer)   Complete Complete Complete  PCP or Specialist appointment within 3-5 days of discharge   Complete Complete Complete  HRI or Home Care Consult   Complete Complete Complete  SW Recovery Care/Counseling Consult   Complete Complete Complete  Palliative Care Screening   Not Applicable Not Applicable Not Applicable  Skilled Nursing Facility   Not Applicable Not Applicable Not Applicable

## 2021-10-11 NOTE — Discharge Instructions (Signed)
Carbohydrate Counting For People With Diabetes  Foods with carbohydrates make your blood glucose level go up. Learning how to count carbohydrates can help you control your blood glucose levels. First, identify the foods you eat that contain carbohydrates. Then, using the Foods with Carbohydrates chart, determine about how much carbohydrates are in your meals and snacks. Make sure you are eating foods with fiber, protein, and healthy fat along with your carbohydrate foods. Foods with Carbohydrates The following table shows carbohydrate foods that have about 15 grams of carbohydrate each. Using measuring cups, spoons, or a food scale when you first begin learning about carbohydrate counting can help you learn about the portion sizes you typically eat. The following foods have 15 grams carbohydrate each:  Grains 1 slice bread (1 ounce)  1 small tortilla (6-inch size)   large bagel (1 ounce)  1/3 cup pasta or rice (cooked)   hamburger or hot dog bun ( ounce)   cup cooked cereal   to  cup ready-to-eat cereal  2 taco shells (5-inch size) Fruit 1 small fresh fruit ( to 1 cup)   medium banana  17 small grapes (3 ounces)  1 cup melon or berries   cup canned or frozen fruit  2 tablespoons dried fruit (blueberries, cherries, cranberries, raisins)   cup unsweetened fruit juice  Starchy Vegetables  cup cooked beans, peas, corn, potatoes/sweet potatoes   large baked potato (3 ounces)  1 cup acorn or butternut squash  Snack Foods 3 to 6 crackers  8 potato chips or 13 tortilla chips ( ounce to 1 ounce)  3 cups popped popcorn  Dairy 3/4 cup (6 ounces) nonfat plain yogurt, or yogurt with sugar-free sweetener  1 cup milk  1 cup plain rice, soy, coconut or flavored almond milk Sweets and Desserts  cup ice cream or frozen yogurt  1 tablespoon jam, jelly, pancake syrup, table sugar, or honey  2 tablespoons light pancake syrup  1 inch square of frosted cake or 2 inch square of unfrosted  cake  2 small cookies (2/3 ounce each) or  large cookie  Sometimes you'll have to estimate carbohydrate amounts if you don't know the exact recipe. One cup of mixed foods like soups can have 1 to 2 carbohydrate servings, while some casseroles might have 2 or more servings of carbohydrate. Foods that have less than 20 calories in each serving can be counted as "free" foods. Count 1 cup raw vegetables, or  cup cooked non-starchy vegetables as "free" foods. If you eat 3 or more servings at one meal, then count them as 1 carbohydrate serving.  Foods without Carbohydrates  Not all foods contain carbohydrates. Meat, some dairy, fats, non-starchy vegetables, and many beverages don't contain carbohydrate. So when you count carbohydrates, you can generally exclude chicken, pork, beef, fish, seafood, eggs, tofu, cheese, butter, sour cream, avocado, nuts, seeds, olives, mayonnaise, water, black coffee, unsweetened tea, and zero-calorie drinks. Vegetables with no or low carbohydrate include green beans, cauliflower, tomatoes, and onions. How much carbohydrate should I eat at each meal?  Carbohydrate counting can help you plan your meals and manage your weight. Following are some starting points for carbohydrate intake at each meal. Work with your registered dietitian nutritionist to find the best range that works for your blood glucose and weight.   To Lose Weight To Maintain Weight  Women 2 - 3 carb servings 3 - 4 carb servings  Men 3 - 4 carb servings 4 - 5 carb servings  Checking your   blood glucose after meals will help you know if you need to adjust the timing, type, or number of carbohydrate servings in your meal plan. Achieve and keep a healthy body weight by balancing your food intake and physical activity.  Tips How should I plan my meals?  Plan for half the food on your plate to include non-starchy vegetables, like salad greens, broccoli, or carrots. Try to eat 3 to 5 servings of non-starchy vegetables  every day. Have a protein food at each meal. Protein foods include chicken, fish, meat, eggs, or beans (note that beans contain carbohydrate). These two food groups (non-starchy vegetables and proteins) are low in carbohydrate. If you fill up your plate with these foods, you will eat less carbohydrate but still fill up your stomach. Try to limit your carbohydrate portion to  of the plate.  What fats are healthiest to eat?  Diabetes increases risk for heart disease. To help protect your heart, eat more healthy fats, such as olive oil, nuts, and avocado. Eat less saturated fats like butter, cream, and high-fat meats, like bacon and sausage. Avoid trans fats, which are in all foods that list "partially hydrogenated oil" as an ingredient. What should I drink?  Choose drinks that are not sweetened with sugar. The healthiest choices are water, carbonated or seltzer waters, and tea and coffee without added sugars.  Sweet drinks will make your blood glucose go up very quickly. One serving of soda or energy drink is  cup. It is best to drink these beverages only if your blood glucose is low.  Artificially sweetened, or diet drinks, typically do not increase your blood glucose if they have zero calories in them. Read labels of beverages, as some diet drinks do have carbohydrate and will raise your blood glucose. Label Reading Tips Read Nutrition Facts labels to find out how many grams of carbohydrate are in a food you want to eat. Don't forget: sometimes serving sizes on the label aren't the same as how much food you are going to eat, so you may need to calculate how much carbohydrate is in the food you are serving yourself.   Carbohydrate Counting for People with Diabetes Sample 1-Day Menu  Breakfast  cup yogurt, low fat, low sugar (1 carbohydrate serving)   cup cereal, ready-to-eat, unsweetened (1 carbohydrate serving)  1 cup strawberries (1 carbohydrate serving)   cup almonds ( carbohydrate serving)   Lunch 1, 5 ounce can chunk light tuna  2 ounces cheese, low fat cheddar  6 whole wheat crackers (1 carbohydrate serving)  1 small apple (1 carbohydrate servings)   cup carrots ( carbohydrate serving)   cup snap peas  1 cup 1% milk (1 carbohydrate serving)   Evening Meal Stir fry made with: 3 ounces chicken  1 cup brown rice (3 carbohydrate servings)   cup broccoli ( carbohydrate serving)   cup green beans   cup onions  1 tablespoon olive oil  2 tablespoons teriyaki sauce ( carbohydrate serving)  Evening Snack 1 extra small banana (1 carbohydrate serving)  1 tablespoon peanut butter   Carbohydrate Counting for People with Diabetes Vegan Sample 1-Day Menu  Breakfast 1 cup cooked oatmeal (2 carbohydrate servings)   cup blueberries (1 carbohydrate serving)  2 tablespoons flaxseeds  1 cup soymilk fortified with calcium and vitamin D  1 cup coffee  Lunch 2 slices whole wheat bread (2 carbohydrate servings)   cup baked tofu   cup lettuce  2 slices tomato  2 slices avocado     cup baby carrots ( carbohydrate serving)  1 orange (1 carbohydrate serving)  1 cup soymilk fortified with calcium and vitamin D   Evening Meal Burrito made with: 1 6-inch corn tortilla (1 carbohydrate serving)  1 cup refried vegetarian beans (2 carbohydrate servings)   cup chopped tomatoes   cup lettuce   cup salsa  1/3 cup brown rice (1 carbohydrate serving)  1 tablespoon olive oil for rice   cup zucchini   Evening Snack 6 small whole grain crackers (1 carbohydrate serving)  2 apricots ( carbohydrate serving)   cup unsalted peanuts ( carbohydrate serving)    Carbohydrate Counting for People with Diabetes Vegetarian (Lacto-Ovo) Sample 1-Day Menu  Breakfast 1 cup cooked oatmeal (2 carbohydrate servings)   cup blueberries (1 carbohydrate serving)  2 tablespoons flaxseeds  1 egg  1 cup 1% milk (1 carbohydrate serving)  1 cup coffee  Lunch 2 slices whole wheat bread (2 carbohydrate  servings)  2 ounces low-fat cheese   cup lettuce  2 slices tomato  2 slices avocado   cup baby carrots ( carbohydrate serving)  1 orange (1 carbohydrate serving)  1 cup unsweetened tea  Evening Meal Burrito made with: 1 6-inch corn tortilla (1 carbohydrate serving)   cup refried vegetarian beans (1 carbohydrate serving)   cup tomatoes   cup lettuce   cup salsa  1/3 cup brown rice (1 carbohydrate serving)  1 tablespoon olive oil for rice   cup zucchini  1 cup 1% milk (1 carbohydrate serving)  Evening Snack 6 small whole grain crackers (1 carbohydrate serving)  2 apricots ( carbohydrate serving)   cup unsalted peanuts ( carbohydrate serving)    Copyright 2020  Academy of [employer]etics. All rights reserved.  Using Nutrition Labels: Carbohydrate  Serving Size  Look at the serving size. All the information on the label is based on this portion. Servings Per Container  The number of servings contained in the package. Guidelines for Carbohydrate  Look at the total grams of carbohydrate in the serving size.  1 carbohydrate choice = 15 grams of carbohydrate. Range of Carbohydrate Grams Per Choice  Carbohydrate Grams/Choice Carbohydrate Choices  6-10   11-20 1  21-25 1  26-35 2  36-40 2  41-50 3  51-55 3  56-65 4  66-70 4  71-80 5    Copyright 2020  Academy of [employer] Dietetics. All rights reserved.   Gastroparesis Nutrition Therapy  Gastroparesis means that your stomach empties very slowly. This happens when the nerves to your stomach are damaged or do not work properly. This can cause bloating, stomach discomfort or pain, feeling full after eating only a small amount of food, nausea, or vomiting. If you have diabetes in addition to gastroparesis, it is important to control your blood glucose. This will help the stomach empty.  Tips Following these tips may help your stomach empty faster:  Eat small, frequent meals (4 to 6 times per  day).  Do not eat solid foods that are high in fat and do not add too much fat to foods. See the Foods Not Recommended table for foods that are high fat.  High-fat solid foods may delay the emptying of your stomach.  Liquids that contain fat, such as milkshakes, may be tolerated and can provide needed calories. Do not eat foods high in fiber. Do not take fiber supplements or fiber bulking agents for constipation.  Do not eat foods that increase acid reflux:  Acidic, spicy, fried  and greasy foods  Caffeine  Mint Do not drink alcohol or smoke  Do not drink carbonated beverages, as they increase bloating.  Chew foods well before swallowing. Solid foods in the stomach do not empty well. If you have difficulty tolerating solid foods, ground foods may be better.  If symptoms are severe, semi-solid foods or liquids may need to be your main food sources. Choose liquid nutritional supplements that have less than or equal to 2 grams fiber per serving.  Sit upright while eating and sit upright or walk after meals. Do not lie down for 3 to 4 hours after eating to avoid reflux or regurgitation.  If you wish to nap during the day, nap first and then eat.  Drinking fluids at meals can take up room in your stomach, and you might not get enough calories. At every meal, first eat a grain food and a protein food or dairy product if your body can tolerate it. Drink fluids with calories. It may be better to delay fluids until after the meal and drink more between meals.   Foods Recommended Food Group Foods Recommended   Grains Choose grain foods with less than 2 grams of fiber per serving; these will be made with white flour Crackers: saltines or graham crackers Cold cereal: puffed rice Cream of rice or wheat Grits (fine ground) Gluten free low fiber foods Pretzels White bread, toasted White rice, cook until very soft  Protein Foods Lean meat and poultry: well-cooked, very tender, moist, and chopped  fine Fish: tuna, salmon, or white fish Egg whites, scrambled Peanut butter (limit to 1 tablespoon at a time)  Dairy Milk*, drink 2% if tolerated to get more nutrients or lactose-free 2% milk Fortified non-dairy milks: almond, cashew, coconut, or rice (be aware that these options are not good sources of protein so you will need to eat an additional protein food) Fortified pea milk or soymilk (may cause gas and bloating for some) Instant breakfast* (pre-made lactose-free is sold in bottles) Milkshakes* (try blending in  to  cup canned fruit) Ice cream* (low-fat may be tolerated better; use in milkshakes to increase calories) Frozen yogurt Yogurt* Puddings and custard* Sherbet Liquid nutritional supplements with less than or equal to 2 grams fiber per 1 cup serving *Use lactose-free varieties to reduce gas and bloating  Vegetables Canned and well-cooked vegetables without seeds, skins or hulls Carrots, cooked Mashed potatoes (white, red or yellow) Sweet potato  Fruit Canned, soft and well-cooked fruits without seeds, skins or membranes Applesauce Banana, mashed may be tolerated better Diced peaches/pears fruit cups in juice Melon, very soft, cut into small pieces Fruit nectar juices  Oils When possible choose oils rather than solid fats Canola or olive oil Margarine  Other Clear soup Gelatin Popsicles   Foods Not Recommended Food Group Foods Not Recommended   Grains Bran Grains foods with 2 or more grams of fiber per serving: barley, brown rice, kasha, quinoa Popcorn Whole grain and high-fiber cereals, including oats or granola Whole grain bread or pasta  Protein Foods Fried meats, poultry or fish Sausage, bacon or hot dogs Seafood Tough meat, meat with gristle: steak, roast beef or pork chops Beans, peas or lentils Nuts  Dairy Cheese slices Liquid nutritional supplements that have more than 2 grams fiber per serving Pea milk, soymilk (may increase gas and bloating)   Vegetables Raw or undercooked vegetables Alfalfa, asparagus, bean sprouts, broccoli, brussels sprouts, cabbage, cauliflower, corn, green peas or any other kind of peas, lima  beans, mushrooms, okra, onions, parsnips, peppers, pickles, potato skins, or spinach  Fruit Fresh fruit except for the ones in the foods recommended table Acidic fruit and juices: oranges/orange juice, grapefruit/grapefruit juice, tomatoes/tomato juice Avocado Berries Coconut Dried fruit Fruit skin Mandarin oranges Pineapple  Oils Fried foods of any type  Other Coffee Olives or pickles Pizza Salsa Sushi   Gastroparesis Sample 1-Day Menu  Breakfast 1 slice white toast (1 carbohydrate serving)  1 teaspoon margarine, soft, tub   cup egg substitute  1 cup peach nectar (2 carbohydrate servings)  Morning Snack Smoothie made with:  small banana (1 carbohydrate serving)  1/3 cup [place] strawberry yogurt ( carbohydrate serving)  1 cup 2% milk (1 carbohydrate serving)  Lunch 2 ounces canned chicken  1 teaspoon mayonnaise  9 saltine crackers (1 carbohydrate servings)   cup applesauce (1 carbohydrate serving)  Afternoon Snack 1 slice white toast (1 carbohydrate serving)  1 tablespoon smooth peanut butter  Evening Meal 2 ounces baked fish   cup mashed potatoes (1 carbohydrate serving)  1 teaspoon olive oil  1 cup 2% milk (1 carbohydrate serving)  Evening Snack 1 packet instant breakfast (1 carbohydrate servings)  1 cup 2% milk (1 carbohydrate serving)   Gastroparesis Vegetarian (Lacto-Ovo) Sample 1-Day Menu  Breakfast  cup cooked farina (1 carbohydrate serving)   cup egg substitute  2 teaspoons olive oil   cup peach nectar (2 carbohydrate servings)   cup 2% milk ( carbohydrate serving)  Morning Snack 1 slice white toast (1 carbohydrate serving)  1 tablespoon smooth peanut butter  Lunch  cup vegetable soup (1 carbohydrate serving)  9 saltine crackers (1 carbohydrate serving)   cup applesauce (1  carbohydrate serving)   cup 2% milk ( carbohydrate serving)  Afternoon Snack 6 ounces plain yogurt (1 carbohydrate serving)   small banana (1 carbohydrate servings)  Evening Meal  cup baked tofu  2/3 cup white rice (2 carbohydrate servings)  2 teaspoons olive oil   cup 2% milk ( carbohydrate serving)  Evening Snack 1 packet instant breakfast (1 carbohydrate servings)  1 cup 2% milk (1 carbohydrate serving)   Gastroparesis Vegan Sample 1-Day Menu  Breakfast  cup cooked farina (1 carbohydrate serving)  1/3 cup tofu scramble  2 teaspoons olive oil   cup peach nectar (2 carbohydrate servings)   cup almond milk fortified with calcium, vitamin B12, and vitamin D  Morning Snack 1 slice white toast (1 carbohydrate serving)  1 tablespoon smooth peanut butter  Lunch  cup vegetable soup (1 carbohydrate serving)  9 saltine crackers (1 carbohydrate serving)   cup applesauce (1 carbohydrate serving)  Afternoon Snack 6 ounces plain soy yogurt (1 carbohydrate servings)   small banana (1 carbohydrate serving)  Evening Meal  cup baked tofu  2/3 cup white rice (2 carbohydrate servings)  2 teaspoons olive oil   cup almond milk fortified with calcium, vitamin B12, and vitamin D  Evening Snack  scoop soy protein powder ( carbohydrate serving)   cup almond milk fortified with calcium, vitamin B12, and vitamin D  Copyright 2020  Academy of [employer]etics. All rights reserved.

## 2021-10-12 LAB — GLUCOSE, CAPILLARY
Glucose-Capillary: 122 mg/dL — ABNORMAL HIGH (ref 70–99)
Glucose-Capillary: 139 mg/dL — ABNORMAL HIGH (ref 70–99)
Glucose-Capillary: 177 mg/dL — ABNORMAL HIGH (ref 70–99)
Glucose-Capillary: 79 mg/dL (ref 70–99)

## 2021-10-12 MED ORDER — HYDROMORPHONE HCL 1 MG/ML IJ SOLN
0.5000 mg | INTRAMUSCULAR | Status: DC | PRN
  Administered 2021-10-12: 0.5 mg via INTRAVENOUS
  Filled 2021-10-12: qty 0.5

## 2021-10-12 MED ORDER — METOCLOPRAMIDE HCL 5 MG/ML IJ SOLN
5.0000 mg | Freq: Three times a day (TID) | INTRAMUSCULAR | Status: DC
  Administered 2021-10-12 – 2021-10-14 (×7): 5 mg via INTRAVENOUS
  Filled 2021-10-12 (×7): qty 2

## 2021-10-12 MED ORDER — PANTOPRAZOLE SODIUM 40 MG IV SOLR
40.0000 mg | Freq: Every day | INTRAVENOUS | Status: DC
  Administered 2021-10-13: 40 mg via INTRAVENOUS
  Filled 2021-10-12 (×2): qty 10

## 2021-10-12 MED ORDER — HYDROMORPHONE HCL 1 MG/ML IJ SOLN
2.0000 mg | Freq: Once | INTRAMUSCULAR | Status: AC
  Administered 2021-10-12: 2 mg via INTRAVENOUS
  Filled 2021-10-12: qty 2

## 2021-10-12 MED ORDER — HYDROMORPHONE HCL 1 MG/ML IJ SOLN
0.5000 mg | Freq: Four times a day (QID) | INTRAMUSCULAR | Status: DC | PRN
  Administered 2021-10-12 – 2021-10-13 (×3): 0.5 mg via INTRAVENOUS
  Filled 2021-10-12 (×3): qty 0.5

## 2021-10-12 NOTE — Progress Notes (Signed)
PROGRESS NOTE  Kelli Hudson  DOB: 1990/12/05  PCP: Loyola Mast, MD HWE:[id number]  DOA: 10/09/2021  LOS: 2 days  Hospital Day: 4  Brief narrative: Kelli Hudson is a 31 y.o. female with PMH significant for DM1, severe gastroparesis, GERD, chronic anemia, anxiety/depression, neuropathy, nephropathy who was recently hospitalized 8/18 to 8/21 for intractable nausea, vomiting, treated with IV Reglan, IV erythromycin per GI recommendation and was discharged on the same. Patient presented to the ED again on 8/23 with recurrence of symptoms. In the ED, patient was tachycardic, lipase was normal, electrolytes were stable. Patient was admitted to the hospitalist service. GI consultation was obtained.  Subjective: Patient was seen and examined this morning.  Pleasant young African-American female.  Her mom was on the phone at the time of my evaluation. Last vomiting was yesterday.  Nauseated this morning. 1 dose of Dilaudid IV 2 mg was ordered by GI Dr. Loreta Ave this morning.  Assessment and plan: Intractable nausea, vomiting Chronic severe gastroparesis Recurrent hospitalization for symptoms flareup Ongoing symptomatic management with IV antiemetics. I noted multiple conversation between previous providers, patient and family on the topic of the risk and benefit of IV Dilaudid.  Despite patient and family's preference of IV Dilaudid, previous providers as well as GI consult have discouraged the effectiveness of IV Dilaudid. Noted GI consultation by Dr. Barron Alvine from 8/26.  He recommended to maximize medications for underlying gastroparesis.  However this effort is limited by patient's prolonged QTc.  QTc 588 on 8/26.  Suggested a trial of Motegrity (apparently not available inpatient).  She had been previously recommended for domperidone as well as an outpatient.  In the past, GI attending [name] had referred her to tertiary centers but apparently she has been declined  those centers as well. For now, patient is on scheduled IV Reglan 10 mg every 6 hours, IV Compazine 10 mg every 4 hours as needed along with gastroparesis diet.  I noticed that she was ordered for 1 dose of Dilaudid 2 mg IV by GI Dr. Loreta Ave today.  Patient and her mom both feel better after that.  I will put her on low-dose IV Dilaudid as needed. Also continue IV Robaxin as needed.  I would minimize the dose of IV Reglan scheduled because of QTc concerns.  Full liquid diet to continue.  Recommended to work on the hallway. Continue Protonix IV On IV hydration with NS at 150 mill per hour.  Can reduce to 75 mill per hour today  AKI Creatinine normal at baseline.  Was elevated to 1.52 yesterday.  She was kept on normal saline.  No labs available today. Electrolytes normal on last check on 8/25 Repeat labs tomorrow Recent Labs    09/20/21 1942 09/22/21 0715 09/23/21 0307 09/25/21 8938 10/04/21 1227 10/05/21 0519 10/06/21 0447 10/09/21 1837 10/10/21 0523 10/11/21 0510  BUN 27* 29* 21* 12 24* 24* 14 15 17  23*  CREATININE 1.06* 1.60* 1.07* 0.95 1.23* 1.16* 0.95 1.15* 1.07* 1.52*   Recent Labs  Lab 10/06/21 0447 10/09/21 1837 10/10/21 0523 10/11/21 0510  K 4.1 3.9 4.0 4.0  MG  --   --   --  1.8  PHOS  --   --   --  4.2   Uncontrolled type 1 diabetes mellitus A1c 10.3 on 10/06/2021 PTA on Levemir 16 units daily, Currently continue on the same with sliding scale insulin.  Blood sugar reasonably controlled.  As appetite improves, she may need better coverage. Recent Labs  Lab 10/11/21 1215  10/11/21 1519 10/11/21 1630 10/11/21 2150 10/12/21 0743  GLUCAP 186* 171* 173* 137* 139*   Hypertension PTA on Coreg 6.25 mg twice daily She is getting tachycardic to 110s this morning.  Currently on Coreg  Hyperlipidemia PTA on Lipitor 20 mg nightly Can resume after oral intake improves  Anxiety PTA on Vistaril 25 mg every 8 hours as needed  Goals of care   Code Status: Full Code     Mobility: Encourage ambulation  Skin assessment:     Nutritional status:  Body mass index is 26.21 kg/m.  Nutrition Problem: Increased nutrient needs Etiology: acute illness Signs/Symptoms: estimated needs     Diet:  Diet Order             Diet full liquid Room service appropriate? Yes; Fluid consistency: Thin  Diet effective now                   DVT prophylaxis:  enoxaparin (LOVENOX) injection 40 mg Start: 10/10/21 1000   Antimicrobials: None Fluid: NS at 75 mill per hour Consultants: GI Family Communication: Mom on the phone  Status is: Inpatient  Continue in-hospital care because: Continues to have nausea, vomiting, abdominal pain, oral intolerance Level of care: Telemetry   Dispo: The patient is from: Home              Anticipated d/c is to: Home fully home in 2 to days.              Patient currently is not medically stable to d/c.   Difficult to place patient No     Infusions:   sodium chloride 75 mL/hr at 10/12/21 1036   acetaminophen 1,000 mg (10/12/21 0641)    Scheduled Meds:  carvedilol  6.25 mg Oral BID WC   enoxaparin (LOVENOX) injection  40 mg Subcutaneous Q24H   feeding supplement (GLUCERNA SHAKE)  237 mL Oral BID BM   insulin aspart  0-15 Units Subcutaneous TID WC   insulin detemir  16 Units Subcutaneous Daily   metoCLOPramide (REGLAN) injection  5 mg Intravenous Q8H   pantoprazole (PROTONIX) IV  40 mg Intravenous Daily   sodium chloride flush  3 mL Intravenous Q12H    PRN meds: acetaminophen, acetaminophen **OR** acetaminophen, gabapentin, hydrALAZINE, HYDROmorphone (DILAUDID) injection, hydrOXYzine, polyethylene glycol, prochlorperazine   Antimicrobials: Anti-infectives (From admission, onward)    None       Objective: Vitals:   10/12/21 0600 10/12/21 1050  BP: 135/89 130/80  Pulse: (!) 115 (!) 119  Resp: 20   Temp: 98.2 F (36.8 C) 98 F (36.7 C)  SpO2: 100% 99%    Intake/Output Summary (Last 24 hours) at  10/12/2021 1153 Last data filed at 10/12/2021 0130 Gross per 24 hour  Intake 354.92 ml  Output 200 ml  Net 154.92 ml   Filed Weights   10/10/21 1807  Weight: 78.2 kg   Weight change:  Body mass index is 26.21 kg/m.   Physical Exam: General exam: Young African-American female.  Nauseated Skin: No rashes, lesions or ulcers. HEENT: Atraumatic, normocephalic, no obvious bleeding Lungs: Clear to auscultation bilaterally CVS: Regular rhythm, tachycardic, no murmur  GI/Abd soft, mild epigastric tenderness, nondistended, bowel sound present CNS: Alert, awake, oriented x3 Psychiatry: Sad affect Extremities: No pedal edema, no calf tenderness  Data Review: I have personally reviewed the laboratory data and studies available.  F/u labs ordered Unresulted Labs (From admission, onward)     Start     Ordered   10/16/21 0500  Creatinine, serum  (enoxaparin (LOVENOX)    CrCl >/= 30 ml/min)  Weekly,   R     Comments: while on enoxaparin therapy    10/09/21 2219   10/13/21 0500  CBC with Differential/Platelet  Tomorrow morning,   R        10/12/21 1149   10/13/21 0500  Basic metabolic panel  Tomorrow morning,   R        10/12/21 1149   10/13/21 0500  Magnesium  Tomorrow morning,   R        10/12/21 1149   10/13/21 0500  Phosphorus  Tomorrow morning,   R        10/12/21 1149            Signed, Lorin Glass, MD [facility] 10/12/2021

## 2021-10-12 NOTE — Progress Notes (Signed)
CROSS COVER [facility] Subjective: Patient complains of being miserable with nausea and vomiting though she looks comfortable laying in bed and is requesting that she be given Dilaudid as is the only thing that will break her cycle of nausea and vomiting and she feels we do not realize how much Dilaudid helps her.  Patient was not willing to listen to any other options. Objective: Vital signs in last 24 hours: Temp:  [97.8 F (36.6 C)-100.4 F (38 C)] 98.2 F (36.8 C) (08/26 0600) Pulse Rate:  [114-135] 115 (08/26 0600) Resp:  [12-22] 20 (08/26 0600) BP: (114-190)/(64-120) 135/89 (08/26 0600) SpO2:  [2 %-100 %] 100 % (08/26 0600) Last BM Date : 10/08/21  Intake/Output from previous day: 08/25 0701 - 08/26 0700 In: 354.9 [I.V.:354.9] Out: 500 [Emesis/NG output:500] Intake/Output this shift: No intake/output data recorded.  General appearance: alert, cooperative, and no distress Resp: clear to auscultation bilaterally Cardio: regular rate and rhythm, S1, S2 normal, no murmur, click, rub or gallop GI: soft, non-tender; bowel sounds normal; no masses,  no organomegaly  Lab Results: Recent Labs    10/09/21 1742 10/10/21 0523 10/11/21 0510  WBC 8.9 10.8* 12.7*  HGB 13.6 12.0 10.0*  HCT 41.4 37.4 30.5*  PLT 481* 385 327   BMET Recent Labs    10/09/21 1837 10/10/21 0523 10/11/21 0510  NA 140 142 138  K 3.9 4.0 4.0  CL 107 106 109  CO2 24 24 22   GLUCOSE 214* 259* 105*  BUN 15 17 23*  CREATININE 1.15* 1.07* 1.52*  CALCIUM 9.8 9.4 8.8*   LFT Recent Labs    10/11/21 0510  PROT 5.5*  ALBUMIN 2.8*  AST 15  ALT 12  ALKPHOS 56  BILITOT 1.0   Fecal Lactopherrin No results for input(s): "FECLLACTOFRN" in the last 72 hours.  Studies/Results: No results found.  Medications: I have reviewed the patient's current medications. Prior to Admission:  Medications Prior to Admission  Medication Sig Dispense Refill Last Dose   atorvastatin (LIPITOR) 20 MG tablet Take 1 tablet  (20 mg total) by mouth daily. 90 tablet 3 10/08/2021   carvedilol (COREG) 6.25 MG tablet Take 1 tablet (6.25 mg total) by mouth 2 (two) times daily with a meal. 30 tablet 2 10/08/2021 at 1000   erythromycin base (E-MYCIN) 500 MG tablet Take 1 tablet (500 mg total) by mouth 3 (three) times daily for 10 days. 30 tablet 0 10/08/2021   hydrOXYzine (VISTARIL) 25 MG capsule Take 1 capsule (25 mg total) by mouth every 8 (eight) hours as needed. (Patient taking differently: Take 25 mg by mouth every 8 (eight) hours as needed for anxiety.) 30 capsule 0 10/08/2021   insulin detemir (LEVEMIR FLEXTOUCH) 100 UNIT/ML FlexPen Inject 16 Units into the skin daily. 10 mL 0 10/08/2021   insulin regular (NOVOLIN R) 100 units/mL injection Inject 0-0.15 mLs (0-15 Units total) into the skin See admin instructions. Via pump.Start this medication after you follow up with your PCP (Patient taking differently: Inject 0-15 Units into the skin See admin instructions. Using sliding scale) 30 mL 1 10/09/2021   metoCLOPramide (REGLAN) 10 MG tablet Take 1 tablet (10 mg total) by mouth 3 (three) times daily with meals. 90 tablet 1 10/09/2021   pantoprazole (PROTONIX) 40 MG tablet Take 1 tablet (40 mg total) by mouth 2 (two) times daily before a meal. 60 tablet 2 10/09/2021   prochlorperazine (COMPAZINE) 10 MG tablet Take 1/2-1 tablet (5-10 mg total) by mouth every 8 (eight) hours as needed  for nausea or vomiting. 30 tablet 0 10/09/2021   ondansetron (ZOFRAN) 4 MG tablet Take 1 tablet (4 mg total) by mouth every 8 (eight) hours as needed for nausea or vomiting. (Patient not taking: Reported on 10/10/2021) 30 tablet 0 Not Taking   Scheduled:  carvedilol  6.25 mg Oral BID WC   enoxaparin (LOVENOX) injection  40 mg Subcutaneous Q24H   feeding supplement (GLUCERNA SHAKE)  237 mL Oral BID BM   insulin aspart  0-15 Units Subcutaneous TID WC   insulin detemir  16 Units Subcutaneous Daily   metoCLOPramide (REGLAN) injection  5 mg Intravenous Q8H    pantoprazole (PROTONIX) IV  40 mg Intravenous Daily   sodium chloride flush  3 mL Intravenous Q12H   Continuous:  sodium chloride 75 mL/hr at 10/12/21 1036   acetaminophen 1,000 mg (10/12/21 0641)    Assessment/Plan: 1) Recurrent abdominal pain with intractable nausea and vomiting-history of gastroparesis uncontrolled A1c, diabetic neuropathy-have allowed patient 1 dose of Dilaudid and nothing more that that.. I have clearly told her that narcotics will delay gastric emptying and worsen her symptoms; moreover her history of  prolonged QT interval limits and the medications that can be prescribed for her.  We will try to see if the pharmacy can get some Motegrity for her and try this while she is in the hospital. 2) Abnormal CT scan-mild diffuse colonic wall thickening ?colitis-there is no history of diarrhea or lower GI symptoms. We we will continue to correlate clinically 3) Uncontrolled IDDM.  LOS: 2 days   Charna Elizabeth 10/12/2021, 7:28 AM

## 2021-10-13 LAB — GLUCOSE, CAPILLARY
Glucose-Capillary: 92 mg/dL (ref 70–99)
Glucose-Capillary: 157 mg/dL — ABNORMAL HIGH (ref 70–99)
Glucose-Capillary: 203 mg/dL — ABNORMAL HIGH (ref 70–99)
Glucose-Capillary: 90 mg/dL (ref 70–99)

## 2021-10-13 LAB — BASIC METABOLIC PANEL
Anion gap: 6 (ref 5–15)
BUN: 11 mg/dL (ref 6–20)
CO2: 26 mmol/L (ref 22–32)
Calcium: 8.8 mg/dL — ABNORMAL LOW (ref 8.9–10.3)
Chloride: 106 mmol/L (ref 98–111)
Creatinine, Ser: 1.17 mg/dL — ABNORMAL HIGH (ref 0.44–1.00)
GFR, Estimated: >60 mL/min (ref >60)
Glucose, Bld: 107 mg/dL — ABNORMAL HIGH (ref 70–99)
Potassium: 3.5 mmol/L (ref 3.5–5.1)
Sodium: 138 mmol/L (ref 135–145)

## 2021-10-13 LAB — CBC WITH DIFFERENTIAL/PLATELET
Abs Immature Granulocytes: 0.03 10*3/uL (ref 0.00–0.07)
Basophils Absolute: 0 10*3/uL (ref 0.0–0.1)
Basophils Relative: 0 %
Eosinophils Absolute: 0.1 10*3/uL (ref 0.0–0.5)
Eosinophils Relative: 1 %
HCT: 30 % — ABNORMAL LOW (ref 36.0–46.0)
Hemoglobin: 9.8 g/dL — ABNORMAL LOW (ref 12.0–15.0)
Immature Granulocytes: 0 %
Lymphocytes Relative: 49 %
Lymphs Abs: 4.5 10*3/uL — ABNORMAL HIGH (ref 0.7–4.0)
MCH: 28.8 pg (ref 26.0–34.0)
MCHC: 32.7 g/dL (ref 30.0–36.0)
MCV: 88.2 fL (ref 80.0–100.0)
Monocytes Absolute: 0.8 10*3/uL (ref 0.1–1.0)
Monocytes Relative: 9 %
Neutro Abs: 3.8 10*3/uL (ref 1.7–7.7)
Neutrophils Relative %: 41 %
Platelets: 324 10*3/uL (ref 150–400)
RBC: 3.4 MIL/uL — ABNORMAL LOW (ref 3.87–5.11)
RDW: 13.3 % (ref 11.5–15.5)
WBC: 9.3 10*3/uL (ref 4.0–10.5)
nRBC: 0 % (ref 0.0–0.2)

## 2021-10-13 LAB — PHOSPHORUS: Phosphorus: 3.5 mg/dL (ref 2.5–4.6)

## 2021-10-13 LAB — MAGNESIUM: Magnesium: 1.5 mg/dL — ABNORMAL LOW (ref 1.7–2.4)

## 2021-10-13 MED ORDER — MAGNESIUM SULFATE 2 GM/50ML IV SOLN
2.0000 g | Freq: Once | INTRAVENOUS | Status: AC
  Administered 2021-10-13: 2 g via INTRAVENOUS
  Filled 2021-10-13: qty 50

## 2021-10-13 MED ORDER — HYDROMORPHONE HCL 1 MG/ML IJ SOLN: 0.5000 mg | Freq: Three times a day (TID) | INTRAMUSCULAR | Status: DC | PRN

## 2021-10-13 MED ORDER — PANTOPRAZOLE SODIUM 40 MG PO TBEC
40.0000 mg | DELAYED_RELEASE_TABLET | Freq: Every day | ORAL | Status: DC
  Administered 2021-10-14: 40 mg via ORAL
  Filled 2021-10-13: qty 1

## 2021-10-13 MED ORDER — HYDROMORPHONE HCL 1 MG/ML IJ SOLN
0.5000 mg | Freq: Three times a day (TID) | INTRAMUSCULAR | Status: DC | PRN
  Administered 2021-10-13 – 2021-10-14 (×3): 0.5 mg via INTRAVENOUS
  Filled 2021-10-13 (×3): qty 0.5

## 2021-10-13 NOTE — Progress Notes (Signed)
PROGRESS NOTE  Kelli Hudson  DOB: 31-Jan-1991  PCP: Loyola Mast, MD PNT:[id number]  DOA: 10/09/2021  LOS: 3 days  Hospital Day: 5  Brief narrative: Kelli Hudson is a 31 y.o. female with PMH significant for DM1, severe gastroparesis, GERD, chronic anemia, anxiety/depression, neuropathy, nephropathy who was recently hospitalized 8/18 to 8/21 for intractable nausea, vomiting, treated with IV Reglan, IV erythromycin per GI recommendation and was discharged on the same. Patient presented to the ED again on 8/23 with recurrence of symptoms. In the ED, patient was tachycardic, lipase was normal, electrolytes were stable. Patient was admitted to the hospitalist service. GI consultation was obtained.  Subjective: Patient was seen and examined this morning.  More relaxed today.  Nausea persist but no vomiting since yesterday.  Tried soft diet today.   On low-dose Dilaudid.  Patient agrees to reduce the dose and frequency today.  No family at bedside  Assessment and plan: Intractable nausea, vomiting Chronic severe gastroparesis Recurrent hospitalization for symptoms flareup Ongoing symptomatic management with IV antiemetics. I noted multiple conversation between previous providers, patient and family on the topic of the risk and benefit of IV Dilaudid.  Despite patient and family's preference of IV Dilaudid, previous providers as well as GI consult have discouraged the effectiveness of IV Dilaudid. Noted GI consultation by Dr. Barron Alvine from 8/26.  He recommended to maximize medications for underlying gastroparesis.  However this effort is limited by patient's prolonged QTc.  QTc 588 on 8/26.  Suggested a trial of Motegrity (apparently not available inpatient).  She had been previously recommended for domperidone as well as an outpatient.  In the past, GI attending [name] had referred her to tertiary centers but apparently she has been declined those centers as  well. For now, patient is on scheduled IV Reglan 5 mg every 8 hours, IV Compazine 10 mg every 4 hours as needed along with gastroparesis diet.  I have ordered on Dilaudid 0.5 mg every 8 hours for next 24 hours.  Continue IV Robaxin as needed.  Continue Protonix IV. Gradually improving.   Continue NS at 75 mill per hour.  AKI Creatinine normal at baseline.  Was elevated to 1.52 yesterday.  She was kept on normal saline.  No labs available today. Electrolytes normal on last check on 8/25 Repeat labs tomorrow Recent Labs    09/22/21 0715 09/23/21 0307 09/25/21 0338 10/04/21 1227 10/05/21 0519 10/06/21 0447 10/09/21 1837 10/10/21 0523 10/11/21 0510 10/13/21 0524  BUN 29* 21* 12 24* 24* 14 15 17  23* 11  CREATININE 1.60* 1.07* 0.95 1.23* 1.16* 0.95 1.15* 1.07* 1.52* 1.17*   Hypomagnesemia Magnesium low at 1.5 this morning.  IV replacement given. Recent Labs  Lab 10/09/21 1837 10/10/21 0523 10/11/21 0510 10/13/21 0524  K 3.9 4.0 4.0 3.5  MG  --   --  1.8 1.5*  PHOS  --   --  4.2 3.5   Uncontrolled type 1 diabetes mellitus A1c 10.3 on 10/06/2021 PTA on Levemir 16 units daily, Currently continue on the same with sliding scale insulin.  Blood sugar reasonably controlled.  As appetite improves, she may need better coverage. Recent Labs  Lab 10/12/21 1200 10/12/21 1726 10/12/21 2337 10/13/21 0738 10/13/21 1158  GLUCAP 177* 122* 79 92 157*   Hypertension PTA on Coreg 6.25 mg twice daily She is getting tachycardic to 110s this morning.  Currently on Coreg  Hyperlipidemia PTA on Lipitor 20 mg nightly Can resume after oral intake improves  Anxiety PTA on Vistaril 25  mg every 8 hours as needed  Goals of care   Code Status: Full Code    Mobility: Encourage ambulation  Skin assessment:     Nutritional status:  Body mass index is 26.21 kg/m.  Nutrition Problem: Increased nutrient needs Etiology: acute illness Signs/Symptoms: estimated needs     Diet:  Diet  Order             Diet full liquid Room service appropriate? Yes; Fluid consistency: Thin  Diet effective now                   DVT prophylaxis:  enoxaparin (LOVENOX) injection 40 mg Start: 10/10/21 1000   Antimicrobials: None Fluid: NS at 75 mill per hour Consultants: GI Family Communication: Family not at bedside  Status is: Inpatient  Continue in-hospital care because: In the nausea.  Improving overall tolerance Level of care: Telemetry   Dispo: The patient is from: Home              Anticipated d/c is to: Hopefully home tomorrow.              Patient currently is not medically stable to d/c.   Difficult to place patient No     Infusions:   sodium chloride 75 mL/hr at 10/12/21 1546    Scheduled Meds:  carvedilol  6.25 mg Oral BID WC   enoxaparin (LOVENOX) injection  40 mg Subcutaneous Q24H   feeding supplement (GLUCERNA SHAKE)  237 mL Oral BID BM   insulin aspart  0-15 Units Subcutaneous TID WC   insulin detemir  16 Units Subcutaneous Daily   metoCLOPramide (REGLAN) injection  5 mg Intravenous Q8H   pantoprazole (PROTONIX) IV  40 mg Intravenous Daily   sodium chloride flush  3 mL Intravenous Q12H    PRN meds: acetaminophen **OR** acetaminophen, gabapentin, hydrALAZINE, HYDROmorphone (DILAUDID) injection, hydrOXYzine, polyethylene glycol, prochlorperazine   Antimicrobials: Anti-infectives (From admission, onward)    None       Objective: Vitals:   10/13/21 0952 10/13/21 1425  BP: (!) 133/97 (!) 135/96  Pulse: (!) 114 (!) 101  Resp: 14 14  Temp: 98.4 F (36.9 C) 98.2 F (36.8 C)  SpO2: 98% 99%    Intake/Output Summary (Last 24 hours) at 10/13/2021 1512 Last data filed at 10/12/2021 2059 Gross per 24 hour  Intake 120 ml  Output 175 ml  Net -55 ml   Filed Weights   10/10/21 1807  Weight: 78.2 kg   Weight change:  Body mass index is 26.21 kg/m.   Physical Exam: General exam: Young African-American female.  Nauseated Skin: No rashes,  lesions or ulcers. HEENT: Atraumatic, normocephalic, no obvious bleeding Lungs: Clear to auscultation bilaterally CVS: Regular rhythm, tachycardic, no murmur  GI/Abd soft, mild epigastric tenderness, nondistended, bowel sound present CNS: Alert, awake, oriented x3 Psychiatry: Feels more comfortable today Extremities: No pedal edema, no calf tenderness  Data Review: I have personally reviewed the laboratory data and studies available.  F/u labs ordered Unresulted Labs (From admission, onward)     Start     Ordered   10/16/21 0500  Creatinine, serum  (enoxaparin (LOVENOX)    CrCl >/= 30 ml/min)  Weekly,   R     Comments: while on enoxaparin therapy    10/09/21 2219            Signed, Lorin Glass, MD [facility] 10/13/2021

## 2021-10-13 NOTE — Progress Notes (Signed)
CROSS COVER [facility]ubjective: Since I last evaluated the patient, she seems to be more relaxed and comfortable.  She denies any active complaints at this time.  Objective: Vital signs in last 24 hours: Temp:  [98 F (36.7 C)-98.9 F (37.2 C)] 98.2 F (36.8 C) (08/27 0606) Pulse Rate:  [108-136] 110 (08/27 0606) Resp:  [13-20] 13 (08/27 0606) BP: (124-182)/(75-122) 149/105 (08/27 0606) SpO2:  [98 %-100 %] 99 % (08/27 0606) Last BM Date : 10/08/21  Intake/Output from previous day: 08/26 0701 - 08/27 0700 In: 120 [P.O.:120] Out: 275 [Emesis/NG output:275] Intake/Output this shift: Total I/O In: 120 [P.O.:120] Out: -   General appearance: alert, cooperative, and no distress Resp: clear to auscultation bilaterally Cardio: regular rate and rhythm, S1, S2 normal, no murmur, click, rub or gallop GI: soft, non-tender; bowel sounds normal; no masses,  no organomegaly  Lab Results: Recent Labs    10/11/21 0510 10/13/21 0524  WBC 12.7* 9.3  HGB 10.0* 9.8*  HCT 30.5* 30.0*  PLT 327 324   BMET Recent Labs    10/11/21 0510 10/13/21 0524  NA 138 138  K 4.0 3.5  CL 109 106  CO2 22 26  GLUCOSE 105* 107*  BUN 23* 11  CREATININE 1.52* 1.17*  CALCIUM 8.8* 8.8*   LFT Recent Labs    10/11/21 0510  PROT 5.5*  ALBUMIN 2.8*  AST 15  ALT 12  ALKPHOS 56  BILITOT 1.0   PT/INR No results for input(s): "LABPROT", "INR" in the last 72 hours. Hepatitis Panel No results for input(s): "HEPBSAG", "HCVAB", "HEPAIGM", "HEPBIGM" in the last 72 hours. C-Diff No results for input(s): "CDIFFTOX" in the last 72 hours. No results for input(s): "CDIFFPCR" in the last 72 hours. Fecal Lactopherrin No results for input(s): "FECLLACTOFRN" in the last 72 hours.  Studies/Results: No results found.  Medications: I have reviewed the patient's current medications. Prior to Admission:  Medications Prior to Admission  Medication Sig Dispense Refill Last Dose   atorvastatin (LIPITOR) 20 MG  tablet Take 1 tablet (20 mg total) by mouth daily. 90 tablet 3 10/08/2021   carvedilol (COREG) 6.25 MG tablet Take 1 tablet (6.25 mg total) by mouth 2 (two) times daily with a meal. 30 tablet 2 10/08/2021 at 1000   erythromycin base (E-MYCIN) 500 MG tablet Take 1 tablet (500 mg total) by mouth 3 (three) times daily for 10 days. 30 tablet 0 10/08/2021   hydrOXYzine (VISTARIL) 25 MG capsule Take 1 capsule (25 mg total) by mouth every 8 (eight) hours as needed. (Patient taking differently: Take 25 mg by mouth every 8 (eight) hours as needed for anxiety.) 30 capsule 0 10/08/2021   insulin detemir (LEVEMIR FLEXTOUCH) 100 UNIT/ML FlexPen Inject 16 Units into the skin daily. 10 mL 0 10/08/2021   insulin regular (NOVOLIN R) 100 units/mL injection Inject 0-0.15 mLs (0-15 Units total) into the skin See admin instructions. Via pump.Start this medication after you follow up with your PCP (Patient taking differently: Inject 0-15 Units into the skin See admin instructions. Using sliding scale) 30 mL 1 10/09/2021   metoCLOPramide (REGLAN) 10 MG tablet Take 1 tablet (10 mg total) by mouth 3 (three) times daily with meals. 90 tablet 1 10/09/2021   pantoprazole (PROTONIX) 40 MG tablet Take 1 tablet (40 mg total) by mouth 2 (two) times daily before a meal. 60 tablet 2 10/09/2021   prochlorperazine (COMPAZINE) 10 MG tablet Take 1/2-1 tablet (5-10 mg total) by mouth every 8 (eight) hours as needed for nausea  or vomiting. 30 tablet 0 10/09/2021   ondansetron (ZOFRAN) 4 MG tablet Take 1 tablet (4 mg total) by mouth every 8 (eight) hours as needed for nausea or vomiting. (Patient not taking: Reported on 10/10/2021) 30 tablet 0 Not Taking   Scheduled:  carvedilol  6.25 mg Oral BID WC   enoxaparin (LOVENOX) injection  40 mg Subcutaneous Q24H   feeding supplement (GLUCERNA SHAKE)  237 mL Oral BID BM   insulin aspart  0-15 Units Subcutaneous TID WC   insulin detemir  16 Units Subcutaneous Daily   metoCLOPramide (REGLAN) injection  5 mg  Intravenous Q8H   pantoprazole (PROTONIX) IV  40 mg Intravenous Daily   sodium chloride flush  3 mL Intravenous Q12H   Continuous:  sodium chloride 75 mL/hr at 10/12/21 1546   ZOX:[id number] **OR** acetaminophen, gabapentin, hydrALAZINE, HYDROmorphone (DILAUDID) injection, hydrOXYzine, polyethylene glycol, prochlorperazine  Assessment/Plan: ) Recurrent abdominal pain with intractable nausea and vomiting-history of gastroparesis uncontrolled A1c, diabetic neuropathy-seems to be slightly improved today. 2) Abnormal CT scan-mild diffuse colonic wall thickening ?colitis-there is no history of diarrhea or lower GI symptoms. We we will continue to correlate clinically 3) Uncontrolled IDDM-patient is to work on stricter control of her blood sugars with a low fat low calorie low sugar diet.  LOS: 3 days   Kelli Hudson 10/13/2021, 6:44 AM

## 2021-10-13 NOTE — Progress Notes (Signed)

## 2021-10-14 DIAGNOSIS — R112 Nausea with vomiting, unspecified: Secondary | ICD-10-CM | POA: Diagnosis not present

## 2021-10-14 LAB — GLUCOSE, CAPILLARY
Glucose-Capillary: 78 mg/dL (ref 70–99)
Glucose-Capillary: 177 mg/dL — ABNORMAL HIGH (ref 70–99)

## 2021-10-14 MED ORDER — METOCLOPRAMIDE HCL 5 MG PO TABS: 5.0000 mg | ORAL_TABLET | Freq: Three times a day (TID) | ORAL | 0 refills | Status: DC

## 2021-10-14 MED ORDER — GABAPENTIN 100 MG PO CAPS: 100.0000 mg | ORAL_CAPSULE | Freq: Three times a day (TID) | ORAL | 0 refills | Status: DC

## 2021-10-14 MED ORDER — HYDROMORPHONE HCL 2 MG PO TABS: 2.0000 mg | ORAL_TABLET | Freq: Three times a day (TID) | ORAL | 0 refills | Status: AC | PRN

## 2021-10-14 NOTE — Progress Notes (Signed)
Discharge instruction given to pt. Three prescriptions given to pt. Belonging returned to pt. IV removed. Pt verbalized understanding discharge instruction.

## 2021-10-14 NOTE — Discharge Summary (Addendum)
Physician Discharge Summary  Kelli Hudson [id number] DOB: 03-Aug-1990 DOA: 10/09/2021  PCP: [name], MD  Admit date: 10/09/2021 Discharge date: 10/14/2021  Admitted From: Home Discharge disposition: Home  Recommendations at discharge:  Please stay on soft diet for next 2 to 3 days before gradually advancing Please minimize use of pain medicines. Please improve compliance to diabetes control.  Follow-up with endocrinologist as an outpatient    Brief narrative: Kelli Hudson is a 31 y.o. female with PMH significant for DM1, severe gastroparesis, GERD, chronic anemia, anxiety/depression, neuropathy, nephropathy who was recently hospitalized 8/18 to 8/21 for intractable nausea, vomiting, treated with IV Reglan, IV erythromycin per GI recommendation and was discharged on the same. Patient presented to the ED again on 8/23 with recurrence of symptoms. In the ED, patient was tachycardic, lipase was normal, electrolytes were stable. Patient was admitted to the hospitalist service. GI consultation was obtained.  Subjective: Patient was seen and examined this morning.   Lying down in bed.  Feels better.  No nausea.  Has not thrown up in 2 days.  Able to tolerate soft diet.  Improved with low-dose IV Dilaudid, Reglan, robaxin.  Hospital course: Intractable nausea, vomiting Chronic severe gastroparesis Recurrent hospitalization for symptoms flareup Patient's symptoms are due to gastroparesis. She has had multiple conversation between previous providers, patient and family on multiple approach to treatment of gastroparesis and frequent readmissions.   She had been previously recommended a trial of Motegrity, domperidone as well as an outpatient.  Her treatment options are also limited because of prolonged QTc.  In the past, GI attending Dr. Havery Moros had referred her to tertiary centers but apparently she has been declined those centers as well.  This  hospitalization, she was tried on IV Reglan scheduled, low-dose IV Dilaudid as needed, IV Robaxin.  Erythromycin could not be tried because of prolonged QTc. In last 48 hours, patient has had significant improvement.  Able to tolerate soft diet. We will discharge her on the same medications.  There have been multiple conversation between providers, patient and her mother about the risk and benefit of Dilaudid.   AKI Creatinine normal at baseline.  Was elevated to 1.52 on 8/25.  Subsequently improved with IV hydration. Recent Labs    09/22/21 0715 09/23/21 0307 09/25/21 0338 10/04/21 1227 10/05/21 0519 10/06/21 0447 10/09/21 1837 10/10/21 0523 10/11/21 0510 10/13/21 0524  BUN 29* 21* 12 24* 24* 14 15 17  23* 11  CREATININE 1.60* 1.07* 0.95 1.23* 1.16* 0.95 1.15* 1.07* 1.52* 1.17*   Hypomagnesemia Magnesium low at 1.5 on 8/27.  Improved with IV replacement. Recent Labs  Lab 10/09/21 1837 10/10/21 0523 10/11/21 0510 10/13/21 0524  K 3.9 4.0 4.0 3.5  MG  --   --  1.8 1.5*  PHOS  --   --  4.2 3.5   Uncontrolled type 1 diabetes mellitus A1c 10.3 on 10/06/2021 PTA on Levemir 16 units daily, Currently continue on the same with sliding scale insulin.  Blood sugar reasonably controlled.  I think compliance is a major issue.  Encouraged to improve compliance. Recent Labs  Lab 10/13/21 1158 10/13/21 1759 10/13/21 2145 10/14/21 0734 10/14/21 1130  GLUCAP 157* 203* 90 78 177*   Hypertension Continue Coreg.  Tachycardia improved.  Hyperlipidemia PTA on Lipitor 20 mg nightly Continue the same  Anxiety PTA on Vistaril 25 mg every 8 hours as needed Continue the same  Wounds:  -    Discharge Exam:   Vitals:   10/13/21 2144 10/14/21 [id number]  10/14/21 0642 10/14/21 1043  BP: 131/88 111/74 126/83 (!) 129/90  Pulse: 99 91 93 95  Resp: 18 18 18 20   Temp: (!) 97.5 F (36.4 C) 98.1 F (36.7 C) 98.4 F (36.9 C) 98.3 F (36.8 C)  TempSrc: Oral Oral Oral Oral  SpO2: 99% 99% 98%  99%  Weight:      Height:        Body mass index is 26.21 kg/m.   General exam: Young African-American female.  Nauseated Skin: No rashes, lesions or ulcers. HEENT: Atraumatic, normocephalic, no obvious bleeding Lungs: Clear to auscultation bilaterally CVS: Regular rhythm, tachycardic, no murmur  GI/Abd soft, mild epigastric tenderness, nondistended, bowel sound present CNS: Alert, awake, oriented x3 Psychiatry: Feels more comfortable today Extremities: No pedal edema, no calf tenderness  Follow ups:    Follow-up Information     Rudd, Lillette Boxer, MD Follow up.   Specialty: Family Medicine Contact information: [place] [address] [phone]                 Discharge Instructions:   Discharge Instructions     Call MD for:  difficulty breathing, headache or visual disturbances   Complete by: As directed    Call MD for:  extreme fatigue   Complete by: As directed    Call MD for:  hives   Complete by: As directed    Call MD for:  persistant dizziness or light-headedness   Complete by: As directed    Call MD for:  persistant nausea and vomiting   Complete by: As directed    Call MD for:  severe uncontrolled pain   Complete by: As directed    Call MD for:  temperature >100.4   Complete by: As directed    Diet Carb Modified   Complete by: As directed    Discharge instructions   Complete by: As directed    Recommendations at discharge:   Please stay on soft diet for next 2 to 3 days before gradually advancing  Please minimize use of pain medicines.  Please improve compliance to diabetes control.  Follow-up with endocrinologist as an outpatient  Discharge instructions for diabetes mellitus: Check blood sugar 3 times a day and bedtime at home. If blood sugar running above 200 or less than 70 please call your MD to adjust insulin. If you notice signs and symptoms of hypoglycemia (low blood sugar) like jitteriness, confusion,  thirst, tremor and sweating, please check blood sugar, drink sugary drink/biscuits/sweets to increase sugar level and call MD or return to ER.    General discharge instructions: Follow with Primary MD [name], MD in 7 days  Please request your PCP  to go over your hospital tests, procedures, radiology results at the follow up. Please get your medicines reviewed and adjusted.  Your PCP may decide to repeat certain labs or tests as needed. Do not drive, operate heavy machinery, perform activities at heights, swimming or participation in water activities or provide baby sitting services if your were admitted for syncope or siezures until you have seen by Primary MD or a Neurologist and advised to do so again. [place] Controlled Substance Reporting System database was reviewed. Do not drive, operate heavy machinery, perform activities at heights, swim, participate in water activities or provide baby-sitting services while on medications for pain, sleep and mood until your outpatient physician has reevaluated you and advised to do so again.  You are strongly recommended to comply with the  dose, frequency and duration of prescribed medications. Activity: As tolerated with Full fall precautions use walker/cane & assistance as needed Avoid using any recreational substances like cigarette, tobacco, alcohol, or non-prescribed drug. If you experience worsening of your admission symptoms, develop shortness of breath, life threatening emergency, suicidal or homicidal thoughts you must seek medical attention immediately by calling 911 or calling your MD immediately  if symptoms less severe. You must read complete instructions/literature along with all the possible adverse reactions/side effects for all the medicines you take and that have been prescribed to you. Take any new medicine only after you have completely understood and accepted all the possible adverse reactions/side effects.  Wear Seat belts  while driving. You were cared for by a hospitalist during your hospital stay. If you have any questions about your discharge medications or the care you received while you were in the hospital after you are discharged, you can call the unit and ask to speak with the hospitalist or the covering physician. Once you are discharged, your primary care physician will handle any further medical issues. Please note that NO REFILLS for any discharge medications will be authorized once you are discharged, as it is imperative that you return to your primary care physician (or establish a relationship with a primary care physician if you do not have one).   Increase activity slowly   Complete by: As directed        Discharge Medications:   Allergies as of 10/14/2021   No Known Allergies      Medication List     STOP taking these medications    erythromycin base 500 MG tablet Commonly known as: E-MYCIN   ondansetron 4 MG tablet Commonly known as: Zofran       TAKE these medications    atorvastatin 20 MG tablet Commonly known as: LIPITOR Take 1 tablet (20 mg total) by mouth daily.   carvedilol 6.25 MG tablet Commonly known as: COREG Take 1 tablet (6.25 mg total) by mouth 2 (two) times daily with a meal.   gabapentin 100 MG capsule Commonly known as: NEURONTIN Take 1 capsule (100 mg total) by mouth 3 (three) times daily.   HYDROmorphone 2 MG tablet Commonly known as: Dilaudid Take 1 tablet (2 mg total) by mouth every 8 (eight) hours as needed for up to 3 days for severe pain.   hydrOXYzine 25 MG capsule Commonly known as: VISTARIL Take 1 capsule (25 mg total) by mouth every 8 (eight) hours as needed. What changed: reasons to take this   insulin regular 100 units/mL injection Commonly known as: NovoLIN R Inject 0-0.15 mLs (0-15 Units total) into the skin See admin instructions. Via pump.Start this medication after you follow up with your PCP What changed: additional  instructions   Levemir FlexTouch 100 UNIT/ML FlexPen Generic drug: insulin detemir Inject 16 Units into the skin daily.   metoCLOPramide 5 MG tablet Commonly known as: REGLAN Take 1 tablet (5 mg total) by mouth 3 (three) times daily before meals for 7 days. What changed:  medication strength how much to take when to take this   pantoprazole 40 MG tablet Commonly known as: PROTONIX Take 1 tablet (40 mg total) by mouth 2 (two) times daily before a meal.   prochlorperazine 10 MG tablet Commonly known as: COMPAZINE Take 1/2-1 tablet (5-10 mg total) by mouth every 8 (eight) hours as needed for nausea or vomiting.         The results of significant diagnostics from  this hospitalization (including imaging, microbiology, ancillary and laboratory) are listed below for reference.    Procedures and Diagnostic Studies:   DG Chest Portable 1 View  Result Date: 10/09/2021 CLINICAL DATA:  Chest pain EXAM: PORTABLE CHEST 1 VIEW COMPARISON:  06/04/2021 FINDINGS: The heart size and mediastinal contours are within normal limits. Both lungs are clear. The visualized skeletal structures are unremarkable. IMPRESSION: Normal study. Electronically Signed   By: Charlett Nose M.D.   On: 10/09/2021 17:19     Labs:   Basic Metabolic Panel: Recent Labs  Lab 10/09/21 1837 10/10/21 0523 10/11/21 0510 10/13/21 0524  NA 140 142 138 138  K 3.9 4.0 4.0 3.5  CL 107 106 109 106  CO2 24 24 22 26   GLUCOSE 214* 259* 105* 107*  BUN 15 17 23* 11  CREATININE 1.15* 1.07* 1.52* 1.17*  CALCIUM 9.8 9.4 8.8* 8.8*  MG  --   --  1.8 1.5*  PHOS  --   --  4.2 3.5   GFR Estimated Creatinine Clearance: 77.3 mL/min (A) (by C-G formula based on SCr of 1.17 mg/dL (H)). Liver Function Tests: Recent Labs  Lab 10/09/21 1837 10/10/21 0523 10/11/21 0510  AST 17 14* 15  ALT 15 14 12   ALKPHOS 72 70 56  BILITOT 1.3* 1.3* 1.0  PROT 6.9 7.0 5.5*  ALBUMIN 3.3* 3.0* 2.8*   Recent Labs  Lab 10/09/21 1837  LIPASE  23   No results for input(s): "AMMONIA" in the last 168 hours. Coagulation profile No results for input(s): "INR", "PROTIME" in the last 168 hours.  CBC: Recent Labs  Lab 10/09/21 1742 10/10/21 0523 10/11/21 0510 10/13/21 0524  WBC 8.9 10.8* 12.7* 9.3  NEUTROABS 5.6  --  5.7 3.8  HGB 13.6 12.0 10.0* 9.8*  HCT 41.4 37.4 30.5* 30.0*  MCV 88.1 88.4 87.9 88.2  PLT 481* 385 327 324   Cardiac Enzymes: No results for input(s): "CKTOTAL", "CKMB", "CKMBINDEX", "TROPONINI" in the last 168 hours. BNP: Invalid input(s): "POCBNP" CBG: Recent Labs  Lab 10/13/21 1158 10/13/21 1759 10/13/21 2145 10/14/21 0734 10/14/21 1130  GLUCAP 157* 203* 90 78 177*   D-Dimer No results for input(s): "DDIMER" in the last 72 hours. Hgb A1c No results for input(s): "HGBA1C" in the last 72 hours. Lipid Profile No results for input(s): "CHOL", "HDL", "LDLCALC", "TRIG", "CHOLHDL", "LDLDIRECT" in the last 72 hours. Thyroid function studies No results for input(s): "TSH", "T4TOTAL", "T3FREE", "THYROIDAB" in the last 72 hours.  Invalid input(s): "FREET3" Anemia work up No results for input(s): "VITAMINB12", "FOLATE", "FERRITIN", "TIBC", "IRON", "RETICCTPCT" in the last 72 hours. Microbiology Recent Results (from the past 240 hour(s))  Culture, blood (routine x 2)     Status: Abnormal   Collection Time: 10/05/21  5:19 AM   Specimen: BLOOD  Result Value Ref Range Status   Specimen Description   Final    BLOOD BLOOD LEFT HAND Performed at [facility], [address] [place], [place]    Special Requests   Final    BOTTLES DRAWN AEROBIC ONLY Blood Culture adequate volume Performed at [facility], [address], [place], [place]    Culture  Setup Time   Final    GRAM POSITIVE RODS AEROBIC BOTTLE ONLY CRITICAL RESULT CALLED TO, READ BACK BY AND VERIFIED WITH: PHARMD M.STWYNE AT 0818 ON 10/07/2021 BY T.SAAD.    Culture (A)  Final     DIPHTHEROIDS(CORYNEBACTERIUM SPECIES) Standardized susceptibility testing for this organism is  not available. Performed at [facility], [address] [place], [place] [address]    Report Status 10/08/2021 FINAL  Final    Time coordinating discharge: 35 minutes  Signed: Kaylan Friedmann  [facility] 10/14/2021, 1:23 PM

## 2021-10-14 NOTE — TOC Transition Note (Signed)
Transition of Care [facility]) - CM/SW Discharge Note   Patient Details  Name: Kelli Hudson MRN: [id number] Date of Birth: 10/25/1990  [facility]) CM/SW Contact:  Golda Acre, RN Phone Number: 10/14/2021, 1:54 PM   Clinical Narrative:    Discharged to go home with no toc needs present.   Final next level of care: Home/Self Care Barriers to Discharge: Barriers Resolved   Patient Goals and CMS Choice Patient states their goals for this hospitalization and ongoing recovery are:: to go home CMS Medicare.gov Compare Post Acute Care list provided to:: Patient    Discharge Placement                       Discharge Plan and Services   Discharge Planning Services: CM Consult                                 Social Determinants of Health (SDOH) Interventions     Readmission Risk Interventions   Row Labels 10/11/2021    8:05 AM 08/27/2021   12:14 PM 06/11/2021    1:01 PM  Readmission Risk Prevention Plan   Section Header. No data exists in this row.     Transportation Screening   Complete Complete Complete  Medication Review Oceanographer)   Complete Complete Complete  PCP or Specialist appointment within 3-5 days of discharge   Complete Complete Complete  HRI or Home Care Consult   Complete Complete Complete  SW Recovery Care/Counseling Consult   Complete Complete Complete  Palliative Care Screening   Not Applicable Not Applicable Not Applicable  Skilled Nursing Facility   Not Applicable Not Applicable Not Applicable

## 2021-10-14 NOTE — Progress Notes (Addendum)
Daily Progress Note  Hospital Day: 6  Chief Complaint: nausea, vomiting  Brief History Kelli Hudson is a 31 y.o. female with a pmh not limited to DM1, gastroparesis, neuropathy, HTN, anxiety. Admitted with abdominal pain, N/V, AKI .    Assessment / Plan  #31 yo female with type I DM admitted with nausea / vomiting and abdominal pain. Treatment complicated by prolonged QT.   Previously offered domperidone, with plan to obtain Rx from a [place] pharmacy for $60/month, but patient was unwilling at that time [name] has previously placed referrals to each of the academic centers [facility], [facility], [facility]), and she has been declined at each of those centers.  Good glucose control.  Seen by Dr. Loreta Ave yesterday who discussed that narcotics will delay gastric emptying  Apparently we have been trying to get Motegrity but it isn't available in patient. For now she is getting scheduled IV Reglan Q 8 hours, prn compazine and gastroparesis diet.  Interval History: She feels much better today. Tolerated breakfast. IV dilaudid helps her abdominal pain and she feels it also helps the nausea / vomiting but understand it is not a long term solution.  Tough situation given her prolonged QT. She says that she is now opening to a being evaluated at an academic center. I will let [name] know and office can refer her to either [place], [facility] or [facility]  # Abnormal CT scans.  CT scans with possible colitis but no clinical correlation.   Subjective   Feels a lot better. Abdominal pain is better. No N/V today. Tolerated breakfast.   Objective   Imaging:  No results found.  Lab Results: Recent Labs    10/13/21 0524  WBC 9.3  HGB 9.8*  HCT 30.0*  PLT 324   BMET Recent Labs    10/13/21 0524  NA 138  K 3.5  CL 106  CO2 26  GLUCOSE 107*  BUN 11  CREATININE 1.17*  CALCIUM 8.8*   LFT No results for input(s): "PROT", "ALBUMIN", "AST", "ALT", "ALKPHOS", "BILITOT",  "BILIDIR", "IBILI" in the last 72 hours. PT/INR No results for input(s): "LABPROT", "INR" in the last 72 hours.   Scheduled inpatient medications:   carvedilol  6.25 mg Oral BID WC   enoxaparin (LOVENOX) injection  40 mg Subcutaneous Q24H   feeding supplement (GLUCERNA SHAKE)  237 mL Oral BID BM   insulin aspart  0-15 Units Subcutaneous TID WC   insulin detemir  16 Units Subcutaneous Daily   metoCLOPramide (REGLAN) injection  5 mg Intravenous Q8H   pantoprazole  40 mg Oral Daily   sodium chloride flush  3 mL Intravenous Q12H   Continuous inpatient infusions:   sodium chloride 75 mL/hr at 10/12/21 1546   PRN inpatient medications: acetaminophen **OR** acetaminophen, gabapentin, hydrALAZINE, HYDROmorphone (DILAUDID) injection, hydrOXYzine, polyethylene glycol, prochlorperazine  Vital signs in last 24 hours: Temp:  [97.5 F (36.4 C)-98.7 F (37.1 C)] 98.4 F (36.9 C) (08/28 0642) Pulse Rate:  [91-114] 93 (08/28 0642) Resp:  [14-18] 18 (08/28 0642) BP: (111-135)/(74-97) 126/83 (08/28 0642) SpO2:  [98 %-100 %] 98 % (08/28 0642) Last BM Date : 10/13/21  Intake/Output Summary (Last 24 hours) at 10/14/2021 0907 Last data filed at 10/13/2021 1600 Gross per 24 hour  Intake 4260 ml  Output --  Net 4260 ml    Intake/Output from previous day: 08/27 0701 - 08/28 0700 In: 4500 [P.O.:500; I.V.:4000] Out: -  Intake/Output this shift: No intake/output data recorded.  Physical Exam:  General: Alert female in NAD Heart:  Regular rate and rhythm. No lower extremity edema Pulmonary: Normal respiratory effort Abdomen: Soft, nondistended, nontender. Normal bowel sounds.  Neurologic: Alert and oriented Psych: Pleasant. Cooperative.    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 I performed a substantive portion of this encounter, including complete performance of at least one of the key components, in conjunction with the APP. I agree with the Advanced Practitioner's note, impression and recommendations.   Diabetic gastroparesis in setting of uncontrolled DM1. Prolonged Qtc  Feels better. Getting dressed to go home.  For now continue current meds (Reglan 5 mg  Q8hrs, compazine 10mg  QID prn) Gastroparesis diet Recommend domperidone or Motegrity as outpt Best possible control of DM She has FU appt with GI early September. Consider ref to academic center as outpt Will sign off for now.   October, MD [facility] [phone]

## 2021-10-14 NOTE — TOC Progression Note (Signed)
Transition of Care [facility]) - Progression Note    Patient Details  Name: Kelli Hudson MRN: [id number] Date of Birth: Aug 15, 1990  [facility] Care [facility]) CM/SW Contact  Golda Acre, RN Phone Number: 10/14/2021, 7:45 AM  Clinical Narrative:    [phone] chart reviewed.  Following for toc needs.  Plan is to return home with self-care currently. Possible dc today.   Expected Discharge Plan: Home/Self Care Barriers to Discharge: Continued Medical Work up  Expected Discharge Plan and Services Expected Discharge Plan: Home/Self Care   Discharge Planning Services: CM Consult   Living arrangements for the past 2 months: Single Family Home                                       Social Determinants of Health (SDOH) Interventions    Readmission Risk Interventions   Row Labels 10/11/2021    8:05 AM 08/27/2021   12:14 PM 06/11/2021    1:01 PM  Readmission Risk Prevention Plan   Section Header. No data exists in this row.     Transportation Screening   Complete Complete Complete  Medication Review Oceanographer)   Complete Complete Complete  PCP or Specialist appointment within 3-5 days of discharge   Complete Complete Complete  HRI or Home Care Consult   Complete Complete Complete  SW Recovery Care/Counseling Consult   Complete Complete Complete  Palliative Care Screening   Not Applicable Not Applicable Not Applicable  Skilled Nursing Facility   Not Applicable Not Applicable Not Applicable

## 2021-10-15 ENCOUNTER — Telehealth: Payer: Self-pay

## 2021-10-15 NOTE — Telephone Encounter (Signed)
-----   Message from Meredith Pel, NP sent at 10/15/2021  9:09 AM EDT ----- Peri Jefferson morning Vernona Rieger,  I think [name] has tried to refer this patient before to several facilities but they may have declined her.  Can you see if you can refer her to either [facility] or [facility] for a second opinion for severe gastroparesis?  The patient does not care which 1 she is referred to.  Please put some kind of a notation in epic that we are working on a referral . Thanks

## 2021-10-15 NOTE — Telephone Encounter (Signed)
Transition Care Management Unsuccessful Follow-up Telephone Call  Date of discharge and from where:  TCM DC [facility] 10-14-21 Dx: severe gastroparesis  Attempts:  1st Attempt  Reason for unsuccessful TCM follow-up call:  Left voice message  Transition Care Management Unsuccessful Follow-up Telephone Call  Date of discharge and from where:  TCM DC [facility] 10-14-21 Dx: severe gastroparesis  Attempts:  2nd Attempt  Reason for unsuccessful TCM follow-up call:  Left voice message  Transition Care Management Unsuccessful Follow-up Telephone Call  Date of discharge and from where: TCM DC [facility] 10-14-21 Dx: severe gastroparesis   Attempts:  3rd Attempt  Reason for unsuccessful TCM follow-up call:  Left voice message

## 2021-10-15 NOTE — Telephone Encounter (Signed)
Referral for a second opinion of severe gastroparesis faxed to [facility] at [phone].

## 2021-10-16 ENCOUNTER — Ambulatory Visit: Payer: Self-pay

## 2021-10-16 NOTE — Patient Outreach (Signed)
  Care Coordination   10/16/2021 Name: Kelli Hudson MRN: [id number] DOB: 1990-10-25   Care Coordination Outreach Attempts:  An unsuccessful telephone outreach was attempted today to offer the patient information about available care coordination services as a benefit of their health plan.   Follow Up Plan:  Additional outreach attempts will be made to offer the patient care coordination information and services.   Encounter Outcome:  No Answer  Care Coordination Interventions Activated:  No   Care Coordination Interventions:  No, not indicated    George Ina RN,BSN,CCM RN Care Manager Coordinator [phone]

## 2021-10-17 ENCOUNTER — Ambulatory Visit: Payer: Self-pay

## 2021-10-17 NOTE — Patient Outreach (Signed)
  Care Coordination   10/17/2021 Name: Kelli Hudson MRN: [id number] DOB: 1990/07/02   Care Coordination Outreach Attempts:  An unsuccessful telephone outreach was attempted for a scheduled appointment today. Second telephone attempt. HIPAA compliant voice message left with call back phone number.   Follow Up Plan:  Additional outreach attempts will be made to offer the patient care coordination information and services.   Encounter Outcome:  No Answer  Care Coordination Interventions Activated:  No   Care Coordination Interventions:  No, not indicated    George Ina RN,BSN,CCM RN Care Manager Coordinator [phone]

## 2021-10-17 NOTE — Chronic Care Management (AMB) (Signed)
Pt transitioned to [facility] community care coordination

## 2021-10-23 ENCOUNTER — Ambulatory Visit: Payer: Self-pay

## 2021-10-23 NOTE — Patient Outreach (Signed)
  Care Coordination   10/23/2021 Name: Vora Clover MRN: [id number] DOB: [birth year]   Care Coordination Outreach Attempts:  An unsuccessful telephone outreach was attempted for a scheduled appointment today. Third attempt to reach patient.   Unable to reach or leave voice message due to message stating mailbox is full.  Follow Up Plan:  Additional outreach attempts will be made to offer the patient care coordination information and services.   Encounter Outcome:  No Answer  Care Coordination Interventions Activated:  No   Care Coordination Interventions:  No, not indicated    George Ina RN,BSN,CCM RN Care Manager Coordinator [phone]

## 2021-10-28 ENCOUNTER — Encounter: Payer: Self-pay | Admitting: Family Medicine

## 2021-10-28 DIAGNOSIS — E785 Hyperlipidemia, unspecified: Secondary | ICD-10-CM

## 2021-10-29 ENCOUNTER — Other Ambulatory Visit: Payer: Self-pay

## 2021-10-29 ENCOUNTER — Ambulatory Visit: Payer: 59 | Admitting: Gastroenterology

## 2021-10-29 ENCOUNTER — Telehealth: Payer: Self-pay

## 2021-10-29 ENCOUNTER — Encounter: Payer: Self-pay | Admitting: Internal Medicine

## 2021-10-29 ENCOUNTER — Encounter: Payer: Self-pay | Admitting: Gastroenterology

## 2021-10-29 VITALS — BP 90/60 | HR 126 | Ht 68.0 in | Wt 181.0 lb

## 2021-10-29 DIAGNOSIS — K3184 Gastroparesis: Secondary | ICD-10-CM | POA: Diagnosis not present

## 2021-10-29 DIAGNOSIS — R9431 Abnormal electrocardiogram [ECG] [EKG]: Secondary | ICD-10-CM

## 2021-10-29 MED ORDER — CARVEDILOL 6.25 MG PO TABS: 6.2500 mg | ORAL_TABLET | Freq: Two times a day (BID) | ORAL | 5 refills | Status: DC

## 2021-10-29 MED ORDER — DEXCOM G6 TRANSMITTER MISC: 3 refills | Status: DC

## 2021-10-29 MED ORDER — ATORVASTATIN CALCIUM 20 MG PO TABS: 20.0000 mg | ORAL_TABLET | Freq: Every day | ORAL | 2 refills | Status: DC

## 2021-10-29 MED ORDER — PANTOPRAZOLE SODIUM 40 MG PO TBEC: 40.0000 mg | DELAYED_RELEASE_TABLET | Freq: Two times a day (BID) | ORAL | 5 refills | Status: DC

## 2021-10-29 NOTE — Telephone Encounter (Signed)
Called [facility]. I spoke with Derryl Harbor, RN was very helpful. She spoke with a neuromuscular nurse and they stated that their physician refers to Dr. Druscilla Brownie at [facility], he is a Development worker, international aid. Derryl Harbor, RN stated that Dr. Jossie Ng would only perform the surgery and someone would need to manage gastroparesis. I told Derryl Harbor, RN that I will call Dr. Karma Lew office to discuss there protocol.   Called Dr. Karma Lew office at [phone]. I spoke with a nurse and they informed me that we can send referral to their office at [phone]. Dr. Jossie Ng would perform the surgery, but a GI physician would need to interpret the pacemaker readings.   Called [facility] to see if they had a surgeon that performed surgery for gastric pacemaker. I was told that they would check and give me a call back.

## 2021-10-29 NOTE — Telephone Encounter (Signed)
Spoke with patient and gave her the number to call [facility] and speak to [facility] to schedule a appt. Patient verbalized understanding.

## 2021-10-29 NOTE — Telephone Encounter (Signed)
-----   Message from Benancio Deeds, MD sent at 10/29/2021  2:26 PM EDT ----- Regarding: question - [facility] - motility - gastric pacemaker [place] wonder if you could help me out with this issue.  I previously referred this patient to [facility] to see Dr. Alycia Rossetti for severe gastroparesis and consideration for gastric pacemaker.  I think he may be retired, they declined her consult.  I am not sure if you can talk to anyone at [facility] GI department to see if they are accepting any of these patients, and if not, where they send them for further evaluation for gastric pacemaker or pyloromyotomy.  I have referred her to [place], [facility], they have all declined her.  Thank you  [name]

## 2021-10-29 NOTE — Patient Instructions (Addendum)
Please have your primary care physician schedule a EKG and send the results to [name] at [phone].  We will refer you back to acupuncture.   We will also refer you to interventional radiology for possible gastrostomy tube.   Continue diet.   We have sent the following medications to your pharmacy for you to pick up at your convenience: pantoprazole and coreg.   We will contact you once we receive the Motegrity samples for you to start.   The  GI providers would like to encourage you to use [facility] to communicate with providers for non-urgent requests or questions.  Due to long hold times on the telephone, sending your provider a message by [facility] may be a faster and more efficient way to get a response.  Please allow 48 business hours for a response.  Please remember that this is for non-urgent requests.   Thank you for entrusting me with your care and for choosing [facility], Dr. Ileene Patrick

## 2021-10-29 NOTE — Telephone Encounter (Signed)
-----   Message from Cooper Render, CMA sent at 10/29/2021  1:46 PM EDT ----- Regarding: RE: referral to acupuncture [facility]: [phone]  thanks   ----- Message ----- From: Maura Crandall, CMA Sent: 10/29/2021   1:35 PM EDT To: Cooper Render, CMA Subject: RE: referral to acupuncture                    Yes it was. Do you want me to call Melissa back and ask her to contact the patient. If so, please give me a good contact number. Thanks. ----- Message ----- From: Cooper Render, CMA Sent: 10/29/2021  12:02 PM EDT To: Maura Crandall, CMA Subject: referral to acupuncture                        Me neither.  I remember I had a hard time finding a place that seemed like an established "practice". I just called and spoke to [place], Production designer, theatre/television/film, at [employer].  She said they don't accept insurance (but can provide itemized billing if patient wants to file a claim with their insurance) so they really don't need a "referral", per se.  She remembers getting the fax I sent but the patient was never seen. She can't remember why. They prefer for the patient to call and initiate the appointment because it's they only way they know for sure the patient is interested.  IF the patient voiced an interest in being contacted, Melissa said she would reach out to her and I'm happy to call back and request that.  To confirm, would you say this is for chronic nausea, vomiting due to gastroparesis?   Thanks, Jan   ----- Message ----- From: Maura Crandall, CMA Sent: 10/29/2021  11:34 AM EDT To: Cooper Render, CMA  Patient was seen by [name] today and he wanted to refer hrt to acupuncture. I can see back in May you initiated the referral but cannot see that they were seen. Do you remember if they did not follow through or maybe what happened? Sorry to bother. I have not referred anyone to acupuncture before. Thanks

## 2021-10-29 NOTE — Progress Notes (Signed)
HPI :  31 year old female here for follow-up visit.  Accompanied by her mother today and additional family members.  I last saw her in May of this year.  Recall she has severe gastroparesis in the setting of poorly controlled diabetes.  She has had numerous ED visits and hospitalizations over the years, particularly this year she has been in the hospital many times.  She was admitted both in July and August most recently.  Recall she has chronic early satiety, with reflux.  She has episodes of gastroparesis that bothers her so much where she feels very nauseated, vomiting that leads to dry heaving, dehydration and admission to the hospital for fluids and antiemetics.  Recall she is on an insulin pump, her A1c has been above 10, longstanding diabetes.  She has had gastric emptying study March 2022 showing severe gastroparesis.  She has had a few EGDs now without concerning pathology.  Most recently had an EGD in July while hospitalized.  Had empiric Botox injection at that time which she states has not really helped.  She does not use any NSAIDs.  Complicating her course has been prolonged QTc interval while in the hospital in the setting of using Reglan, she is also been on a variety of antiemetics with Zofran, Phenergan, Compazine.  I had previously tried to get her domperidone from [place], she could not afford that, and we have held off on using it since the QTc has been prolonged.  She has not been able to used erythromycin due to the prolonged QTc.  She has not had a trial of Motegrity and we had discussed that, unclear if covered by her insurance.  I had placed a referral for her to see [facility], [facility], and [facility] for consideration for gastric pacemaker or pyloromyotomy, she was declined by all of the centers.  Unfortunately she is really been struggling with this issue lately.  Most recently admitted a few weeks ago.  I had previously referred her to acupuncture but she had not made that  appointment.  Since I have last seen her she has made some significant changes to her diet.  She is eating a lot of soups and broths and liquid based diet which she does tolerate much better.  She has been trying to limit the amount of meat and heavy food she is having.  She is trying to eat a lot of fruit, limiting her vegetables.  She states she does not feel as full easily and for the past few weeks at least has been doing pretty well.  She has stopped routine use of Reglan given the prolonged QTc, last was 545 on 8/25. she been on Protonix 40 mg as needed, Compazine as needed.  Zofran and Phenergan have been held.  She reports her bowel function is normal.  No blood in her stools or diarrhea.  Recall she had CT scans in the hospital in July which suggested some questionable colitis.  She had no diarrhea at that time.  We have not thought she would tolerate a bowel prep to perform a colonoscopy for that.      EGD 08/26/21: The larynx was normal. The esophagus was normal. The entire examined stomach was visibly normal. Specifically, no ulcer, mass, inflammation or retained contents other than a small amount of bilious liquid that was easily cleared with suction. Diminished motility seen. The pylorus was patent and without ulceration. Several minutes of observation revealed intermittent pyloric closure causing resistance to scope passage. The pylorus  was successfully injected with 100 units botulinum toxin in a 4-quadrant fashion. The cardia and gastric fundus were normal on retroflexion. The examined duodenum was normal.  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 pylori gastric ulcer    Coffee ground emesis    Diabetes mellitus (HCC)    Diabetic gastroparesis (HCC)    DKA (diabetic ketoacidosis) (HCC) 02/24/2021   Gastroparesis    GERD (gastroesophageal reflux disease)    Hypertension    Hyperthyroidism    Intractable nausea and vomiting 04/20/2021    Normocytic anemia 04/20/2020   Prolonged Q-T interval on ECG      Past Surgical History:  Procedure Laterality Date   AMPUTATION TOE Left 03/10/2018   Procedure: AMPUTATION FIFTH TOE;  Surgeon: Vivi Barrack, DPM;  Location: [facility];  Service: Podiatry;  Laterality: Left;   BIOPSY  01/28/2020   Procedure: BIOPSY;  Surgeon: Kathi Der, MD;  Location: [facility];  Service: Gastroenterology;;   BOTOX INJECTION  08/26/2021   Procedure: BOTOX INJECTION;  Surgeon: Sherrilyn Rist, MD;  Location: [facility];  Service: Gastroenterology;;   ESOPHAGOGASTRODUODENOSCOPY N/A 01/28/2020   Procedure: ESOPHAGOGASTRODUODENOSCOPY (EGD);  Surgeon: Kathi Der, MD;  Location: [facility];  Service: Gastroenterology;  Laterality: N/A;   ESOPHAGOGASTRODUODENOSCOPY (EGD) WITH PROPOFOL Left 09/08/2015   Procedure: ESOPHAGOGASTRODUODENOSCOPY (EGD) WITH PROPOFOL;  Surgeon: Willis Modena, MD;  Location: [facility];  Service: Endoscopy;  Laterality: Left;   ESOPHAGOGASTRODUODENOSCOPY (EGD) WITH PROPOFOL N/A 08/26/2021   Procedure: ESOPHAGOGASTRODUODENOSCOPY (EGD) WITH PROPOFOL;  Surgeon: Sherrilyn Rist, MD;  Location: [facility];  Service: Gastroenterology;  Laterality: N/A;   WISDOM TOOTH EXTRACTION     Family History  Problem Relation Age of Onset   Lung cancer Mother    Diabetes Mother    Bipolar disorder Father    Liver cancer Maternal Grandfather    Diabetes Maternal Aunt        x 2   Pancreatic cancer Maternal Uncle    Prostate cancer Maternal Uncle    Breast cancer Other        maternal great aunt   Heart disease Other    Ovarian cancer Other        maternal great aunt   Kidney disease Other        maternal great aunt   Colon cancer Neg Hx    Stomach cancer Neg Hx    Social History   Tobacco Use   Smoking status: Never   Smokeless tobacco: Never  Vaping Use   Vaping Use: Never used  Substance Use Topics   Alcohol use: Yes    Alcohol/week:  1.0 standard drink of alcohol    Types: 1 Shots of liquor per week    Comment: occasional    Drug use: No   Current Outpatient Medications  Medication Sig Dispense Refill   atorvastatin (LIPITOR) 20 MG tablet Take 1 tablet (20 mg total) by mouth daily. 90 tablet 3   gabapentin (NEURONTIN) 100 MG capsule Take 1 capsule (100 mg total) by mouth 3 (three) times daily. 90 capsule 0   hydrOXYzine (VISTARIL) 25 MG capsule Take 1 capsule (25 mg total) by mouth every 8 (eight) hours as needed. (Patient taking differently: Take 25 mg by mouth every 8 (eight) hours as needed for anxiety.) 30 capsule 0   insulin detemir (LEVEMIR FLEXTOUCH) 100 UNIT/ML FlexPen Inject 16 Units into the skin daily. 10 mL 0  insulin regular (NOVOLIN R) 100 units/mL injection Inject 0-0.15 mLs (0-15 Units total) into the skin See admin instructions. Via pump.Start this medication after you follow up with your PCP (Patient taking differently: Inject 0-15 Units into the skin See admin instructions. Using sliding scale) 30 mL 1   prochlorperazine (COMPAZINE) 10 MG tablet Take 1/2-1 tablet (5-10 mg total) by mouth every 8 (eight) hours as needed for nausea or vomiting. 30 tablet 0   carvedilol (COREG) 6.25 MG tablet Take 1 tablet (6.25 mg total) by mouth 2 (two) times daily with a meal. 30 tablet 5   Continuous Blood Gluc Transmit (DEXCOM G6 TRANSMITTER) MISC Change every 90 days 1 each 3   metoCLOPramide (REGLAN) 5 MG tablet Take 1 tablet (5 mg total) by mouth 3 (three) times daily before meals for 7 days. 21 tablet 0   pantoprazole (PROTONIX) 40 MG tablet Take 1 tablet (40 mg total) by mouth 2 (two) times daily before a meal. 60 tablet 5   No current facility-administered medications for this visit.   No Known Allergies   Review of Systems: All systems reviewed and negative except where noted in HPI.    DG Chest Portable 1 View  Result Date: 10/09/2021 CLINICAL DATA:  Chest pain EXAM: PORTABLE CHEST 1 VIEW COMPARISON:   06/04/2021 FINDINGS: The heart size and mediastinal contours are within normal limits. Both lungs are clear. The visualized skeletal structures are unremarkable. IMPRESSION: Normal study. Electronically Signed   By: Charlett Nose M.D.   On: 10/09/2021 17:19   US RENAL  Result Date: 10/04/2021 CLINICAL DATA:  Acute kidney injury, diabetes, hypertension EXAM: RENAL / URINARY TRACT ULTRASOUND COMPLETE COMPARISON:  CT 08/24/2021 FINDINGS: Right Kidney: Renal measurements: 10.6 x 4.2 x 4.7 = volume: 110 mL. Echogenicity within normal limits. No mass or hydronephrosis visualized. Left Kidney: Renal measurements: 11.2 x 5.2 x 5.7 = volume: 173 mL. Echogenicity within normal limits. No mass or hydronephrosis visualized. Bladder: Appears normal for degree of bladder distention. Other: None. IMPRESSION: Negative.  No hydronephrosis. Electronically Signed   By: Corlis Leak M.D.   On: 10/04/2021 16:26   DG Abd 1 View  Result Date: 10/04/2021 CLINICAL DATA:  Vomiting with history of gastroparesis. EXAM: ABDOMEN - 1 VIEW COMPARISON:  09/08/2020 FINDINGS: Bowel gas pattern is nonobstructive. No free peritoneal air. 2 cylinder shaped metallic densities projecting over the left pelvis and left lower quadrant of uncertain clinical significance as these may be exterior to the patient. Left pelvic phleboliths. Remaining bony structures are unremarkable. IMPRESSION: Nonobstructive bowel gas pattern. Electronically Signed   By: Elberta Fortis M.D.   On: 10/04/2021 14:59    Lab Results  Component Value Date   WBC 9.3 10/13/2021   HGB 9.8 (L) 10/13/2021   HCT 30.0 (L) 10/13/2021   MCV 88.2 10/13/2021   PLT 324 10/13/2021    Lab Results  Component Value Date   CREATININE 1.17 (H) 10/13/2021   BUN 11 10/13/2021   NA 138 10/13/2021   K 3.5 10/13/2021   CL 106 10/13/2021   CO2 26 10/13/2021    Lab Results  Component Value Date   ALT 12 10/11/2021   AST 15 10/11/2021   ALKPHOS 56 10/11/2021   BILITOT 1.0  10/11/2021      Physical Exam: BP 90/60   Pulse (!) 126   Ht 5\' 8"  (1.727 m)   Wt 181 lb (82.1 kg)   BMI 27.52 kg/m  Constitutional: Pleasant, female in no acute distress.  Neurological: Alert and oriented to person place and time. Psychiatric: Normal mood and affect. Behavior is normal.   ASSESSMENT: 31 y.o. female here for assessment of the following  1. Gastroparesis   2. Prolonged Q-T interval on ECG    History as above.  Type 1 diabetes with elevated A1c, severe gastroparesis.  EGDs have looked okay.  CT scans have not shown another cause.  She does have a history of gallstones however she has no baseline biliary colic, I do not think her gallbladder is driving these issues.  Very difficult situation.  Her QTc has been quite prolonged in the past, most recently in the hospital at 543.  Thus she has not been using Reglan, Zofran, Phenergan routinely.  She has been using Compazine as needed for nausea, and PPI at baseline.  She has not been able to afford domperidone nor can we use that with her prolonged QTc.  Likewise she is not a candidate for erythromycin with prolonged QTc.  I previously referred her to tertiary care centers [facility], [place], [place]), none of which would accept her case.  Unfortunately she continues to have episodic symptoms that bother her.  She does seem to be doing better with a mostly liquid diet recently, she should continue with this if she tolerates it better and is not losing weight.  We discussed options long-term.  Now that she is been out of the hospital few weeks I would like her to have another EKG to recheck her QTc.  If the QTc was normal I would consider domperidone, again cost is an issue with this for her.  I am going to reach out to [facility] or [facility] to see where they are sending these patients for consideration for gastric pacemaker or pyloromyotomy.  Patient is willing to travel out of state if needed.  We discussed options in the interim, I am  going to try to get her some samples of Motegrity to see if that would help.  She does not have any baseline constipation and may have diarrhea from this but she is willing to try it.  I have referred her to acupuncture in the past and she did not pursue it, will place referral and she is willing to try it.  Otherwise, in order to keep her out of the hospital and reduce her flares of this, I think she may benefit from a venting gastrostomy tube, and perhaps jejunostomy for feeding.  I discussed this option with her and her parents, she is interested in pursuing this to keep her on the hospital, I will place referral to IR for this.  Otherwise continue current regimen and I will see her back in a few months for reassessment, if not sooner.  If I can find a tertiary care center for her to see in the interim, I will refer her.  She agrees  PLAN: - EKG at PCP to recheck QTc - continue protonix and compazine PRN for now - liquid diet mostly, avoid heavy foods - samples of Motegrity ordered for her, will contact her once they arrive - referral to acupuncture  - referral to tertiary care facility for consideration for gastric pacemaker as above - refer to IR for venting gastrostomy / jejunostomy tube  - holding Zofran / phenergan / erythromycin / Reglan / domperidone given prolonged QTc  - f/u in a few months  Harlin Rain, MD [facility] Gastroenterology

## 2021-10-30 NOTE — Telephone Encounter (Signed)
Called [facility]. I spoke with Verlon Au, she informed me that Dr. Lorina Rabon at [facility] and GI manages gastric pacemakers.   I called Dr. Belenda Cruise office and spoke with [facility]. She informed me that Dr. Ether Griffins has retired and transferred his patient's to [facility] to Dr. Vertis Kelch. Georga Hacking provided me with the phone # [phone].   Called [facility] to confirm that Dr. Ether Griffins does manage gastric pacemakers. I was informed by Cordelia Pen that no Dr. Ether Griffins was at this office. Cordelia Pen tried to reach the triage nurses, but they were not available. Cordelia Pen will check and will give me a call back tomorrow.   I found Dr. Flonnie Overman information online and he is at [facility] in [place]. Call the office at [phone]. The physician line rang for almost 4 minutes. I ended the call and requested to speak with  Dr. Irene Limbo CMA. I had to leave a message for Dr. Irene Limbo CMA. I told her that I was just calling to see if Dr. Ether Griffins still managed patient's with gastric pacemakers since we are attempting to have one placed for the patient. I asked that Lillia Abed give me a call back at my direct office # with the information. I told her that she can also leave the information on my voicemail. Will await return call.

## 2021-10-30 NOTE — Telephone Encounter (Signed)
Lillia Abed, CMA returned call. She stated that Dr. Ether Griffins does manage patient's with gastric stimulators. Lillia Abed, CMA informed me that patient would be scheduled for an office visit to establish care with Dr. Vertis Kelch and then they would refer her to a surgeon. Lillia Abed, CMA asked that I fax referral to their office at [phone].

## 2021-10-31 ENCOUNTER — Ambulatory Visit: Payer: Self-pay

## 2021-10-31 NOTE — Telephone Encounter (Signed)
Patient was notified of referral via MyChart earlier today.

## 2021-10-31 NOTE — Telephone Encounter (Signed)
Referral, records, patient's demographic, and insurance information have been faxed to Dr. Irene Limbo office at [phone].   Attempted to reach patient by phone to relay referral information. Her vm is full and unable to accept any new messages at this time.   MyChart message sent to patient with referral information.

## 2021-10-31 NOTE — Telephone Encounter (Signed)
[place] thank so you much for your diligence with this. Wonderful news. If the patient doesn't know yet, would you just mind letting them know we found someone who does gastric pacemakers and she will be referred to them? Thanks again

## 2021-10-31 NOTE — Patient Outreach (Signed)
  Care Coordination   10/31/2021 Name: Vollie Aaron MRN: [id number] DOB: 30-Mar-1990   Care Coordination Outreach Attempts:  An unsuccessful telephone outreach was attempted for a scheduled appointment today. Unable to leave voice message due to voice mail being full.  Follow Up Plan:  Additional outreach attempts will be made to offer the patient care coordination information and services.   Encounter Outcome:  No Answer  Care Coordination Interventions Activated:  No   Care Coordination Interventions:  No, not indicated    George Ina RN,BSN,CCM [facility] Care Coordination [phone] direct line

## 2021-11-04 ENCOUNTER — Inpatient Hospital Stay (HOSPITAL_COMMUNITY)
Admission: EM | Admit: 2021-11-04 | Discharge: 2021-11-08 | DRG: 074 | Disposition: A | Discharge status: Home / Self Care | Payer: 59 | Attending: Internal Medicine | Admitting: Internal Medicine

## 2021-11-04 ENCOUNTER — Other Ambulatory Visit: Payer: Self-pay

## 2021-11-04 ENCOUNTER — Emergency Department (HOSPITAL_COMMUNITY): Payer: 59

## 2021-11-04 DIAGNOSIS — I152 Hypertension secondary to endocrine disorders: Secondary | ICD-10-CM | POA: Diagnosis present

## 2021-11-04 DIAGNOSIS — K5900 Constipation, unspecified: Secondary | ICD-10-CM | POA: Diagnosis not present

## 2021-11-04 DIAGNOSIS — Z818 Family history of other mental and behavioral disorders: Secondary | ICD-10-CM

## 2021-11-04 DIAGNOSIS — E860 Dehydration: Secondary | ICD-10-CM | POA: Diagnosis present

## 2021-11-04 DIAGNOSIS — K219 Gastro-esophageal reflux disease without esophagitis: Secondary | ICD-10-CM | POA: Diagnosis present

## 2021-11-04 DIAGNOSIS — E1043 Type 1 diabetes mellitus with diabetic autonomic (poly)neuropathy: Secondary | ICD-10-CM | POA: Diagnosis not present

## 2021-11-04 DIAGNOSIS — R0602 Shortness of breath: Secondary | ICD-10-CM | POA: Diagnosis not present

## 2021-11-04 DIAGNOSIS — I169 Hypertensive crisis, unspecified: Secondary | ICD-10-CM | POA: Diagnosis present

## 2021-11-04 DIAGNOSIS — K3184 Gastroparesis: Secondary | ICD-10-CM

## 2021-11-04 DIAGNOSIS — Z803 Family history of malignant neoplasm of breast: Secondary | ICD-10-CM

## 2021-11-04 DIAGNOSIS — R1084 Generalized abdominal pain: Secondary | ICD-10-CM

## 2021-11-04 DIAGNOSIS — R000 Tachycardia, unspecified: Secondary | ICD-10-CM | POA: Diagnosis present

## 2021-11-04 DIAGNOSIS — Z79899 Other long term (current) drug therapy: Secondary | ICD-10-CM

## 2021-11-04 DIAGNOSIS — R079 Chest pain, unspecified: Secondary | ICD-10-CM | POA: Diagnosis not present

## 2021-11-04 DIAGNOSIS — Z833 Family history of diabetes mellitus: Secondary | ICD-10-CM

## 2021-11-04 DIAGNOSIS — Z794 Long term (current) use of insulin: Secondary | ICD-10-CM

## 2021-11-04 DIAGNOSIS — E039 Hypothyroidism, unspecified: Secondary | ICD-10-CM | POA: Diagnosis present

## 2021-11-04 DIAGNOSIS — Z800 Family history of malignant neoplasm of digestive organs: Secondary | ICD-10-CM

## 2021-11-04 DIAGNOSIS — Z765 Malingerer [conscious simulation]: Secondary | ICD-10-CM

## 2021-11-04 DIAGNOSIS — E1159 Type 2 diabetes mellitus with other circulatory complications: Secondary | ICD-10-CM | POA: Diagnosis present

## 2021-11-04 DIAGNOSIS — E785 Hyperlipidemia, unspecified: Secondary | ICD-10-CM | POA: Diagnosis present

## 2021-11-04 DIAGNOSIS — F419 Anxiety disorder, unspecified: Secondary | ICD-10-CM | POA: Diagnosis present

## 2021-11-04 DIAGNOSIS — Z801 Family history of malignant neoplasm of trachea, bronchus and lung: Secondary | ICD-10-CM

## 2021-11-04 DIAGNOSIS — D72829 Elevated white blood cell count, unspecified: Secondary | ICD-10-CM | POA: Diagnosis not present

## 2021-11-04 DIAGNOSIS — G894 Chronic pain syndrome: Secondary | ICD-10-CM | POA: Diagnosis present

## 2021-11-04 DIAGNOSIS — E109 Type 1 diabetes mellitus without complications: Secondary | ICD-10-CM | POA: Diagnosis present

## 2021-11-04 DIAGNOSIS — E1143 Type 2 diabetes mellitus with diabetic autonomic (poly)neuropathy: Secondary | ICD-10-CM | POA: Diagnosis present

## 2021-11-04 DIAGNOSIS — Z8041 Family history of malignant neoplasm of ovary: Secondary | ICD-10-CM

## 2021-11-04 DIAGNOSIS — R112 Nausea with vomiting, unspecified: Secondary | ICD-10-CM | POA: Diagnosis present

## 2021-11-04 LAB — CBC WITH DIFFERENTIAL/PLATELET
Abs Immature Granulocytes: 0.01 10*3/uL (ref 0.00–0.07)
Basophils Absolute: 0.1 10*3/uL (ref 0.0–0.1)
Basophils Relative: 1 %
Eosinophils Absolute: 0 10*3/uL (ref 0.0–0.5)
Eosinophils Relative: 1 %
HCT: 35.2 % — ABNORMAL LOW (ref 36.0–46.0)
Hemoglobin: 11.4 g/dL — ABNORMAL LOW (ref 12.0–15.0)
Immature Granulocytes: 0 %
Lymphocytes Relative: 21 %
Lymphs Abs: 1.8 10*3/uL (ref 0.7–4.0)
MCH: 29.2 pg (ref 26.0–34.0)
MCHC: 32.4 g/dL (ref 30.0–36.0)
MCV: 90 fL (ref 80.0–100.0)
Monocytes Absolute: 0.4 10*3/uL (ref 0.1–1.0)
Monocytes Relative: 5 %
Neutro Abs: 6.3 10*3/uL (ref 1.7–7.7)
Neutrophils Relative %: 72 %
Platelets: 476 10*3/uL — ABNORMAL HIGH (ref 150–400)
RBC: 3.91 MIL/uL (ref 3.87–5.11)
RDW: 13.4 % (ref 11.5–15.5)
WBC: 8.6 10*3/uL (ref 4.0–10.5)
nRBC: 0 % (ref 0.0–0.2)

## 2021-11-04 LAB — I-STAT BETA HCG BLOOD, ED (MC, WL, AP ONLY): I-stat hCG, quantitative: <5 m[IU]/mL (ref <5)

## 2021-11-04 LAB — URINALYSIS, ROUTINE W REFLEX MICROSCOPIC
Bilirubin Urine: NEGATIVE
Glucose, UA: >=500 mg/dL — AB
Ketones, ur: 20 mg/dL — AB
Leukocytes,Ua: NEGATIVE
Nitrite: NEGATIVE
Protein, ur: >=300 mg/dL — AB
Specific Gravity, Urine: 1.016 (ref 1.005–1.030)
pH: 5 (ref 5.0–8.0)

## 2021-11-04 LAB — CBG MONITORING, ED
Glucose-Capillary: 282 mg/dL — ABNORMAL HIGH (ref 70–99)
Glucose-Capillary: 320 mg/dL — ABNORMAL HIGH (ref 70–99)

## 2021-11-04 LAB — I-STAT CHEM 8, ED
BUN: 15 mg/dL (ref 6–20)
Calcium, Ion: 1.26 mmol/L (ref 1.15–1.40)
Chloride: 105 mmol/L (ref 98–111)
Creatinine, Ser: 1.1 mg/dL — ABNORMAL HIGH (ref 0.44–1.00)
Glucose, Bld: 330 mg/dL — ABNORMAL HIGH (ref 70–99)
HCT: 36 % (ref 36.0–46.0)
Hemoglobin: 12.2 g/dL (ref 12.0–15.0)
Potassium: 3.9 mmol/L (ref 3.5–5.1)
Sodium: 136 mmol/L (ref 135–145)
TCO2: 20 mmol/L — ABNORMAL LOW (ref 22–32)

## 2021-11-04 LAB — COMPREHENSIVE METABOLIC PANEL
ALT: 19 U/L (ref 0–44)
AST: 24 U/L (ref 15–41)
Albumin: 3.7 g/dL (ref 3.5–5.0)
Alkaline Phosphatase: 81 U/L (ref 38–126)
Anion gap: 13 (ref 5–15)
BUN: 18 mg/dL (ref 6–20)
CO2: 19 mmol/L — ABNORMAL LOW (ref 22–32)
Calcium: 10.1 mg/dL (ref 8.9–10.3)
Chloride: 106 mmol/L (ref 98–111)
Creatinine, Ser: 1.22 mg/dL — ABNORMAL HIGH (ref 0.44–1.00)
GFR, Estimated: >60 mL/min (ref >60)
Glucose, Bld: 323 mg/dL — ABNORMAL HIGH (ref 70–99)
Potassium: 3.8 mmol/L (ref 3.5–5.1)
Sodium: 138 mmol/L (ref 135–145)
Total Bilirubin: 1.4 mg/dL — ABNORMAL HIGH (ref 0.3–1.2)
Total Protein: 7.8 g/dL (ref 6.5–8.1)

## 2021-11-04 LAB — MAGNESIUM: Magnesium: 1.8 mg/dL (ref 1.7–2.4)

## 2021-11-04 LAB — LIPASE, BLOOD: Lipase: 26 U/L (ref 11–51)

## 2021-11-04 MED ORDER — LACTATED RINGERS IV BOLUS
1000.0000 mL | Freq: Once | INTRAVENOUS | Status: AC
  Administered 2021-11-04: 1000 mL via INTRAVENOUS

## 2021-11-04 MED ORDER — ACETAMINOPHEN 325 MG PO TABS
650.0000 mg | ORAL_TABLET | Freq: Four times a day (QID) | ORAL | Status: DC | PRN
  Administered 2021-11-05: 650 mg via ORAL
  Filled 2021-11-04 (×2): qty 2

## 2021-11-04 MED ORDER — ATORVASTATIN CALCIUM 20 MG PO TABS
20.0000 mg | ORAL_TABLET | Freq: Every day | ORAL | Status: DC
  Administered 2021-11-05 – 2021-11-08 (×4): 20 mg via ORAL
  Filled 2021-11-04 (×4): qty 1

## 2021-11-04 MED ORDER — KETOROLAC TROMETHAMINE 30 MG/ML IJ SOLN
30.0000 mg | Freq: Four times a day (QID) | INTRAMUSCULAR | Status: DC | PRN
  Administered 2021-11-04 – 2021-11-07 (×8): 30 mg via INTRAVENOUS
  Filled 2021-11-04 (×9): qty 1

## 2021-11-04 MED ORDER — HYDROMORPHONE HCL 1 MG/ML IJ SOLN
1.0000 mg | Freq: Once | INTRAMUSCULAR | Status: AC
  Administered 2021-11-04: 1 mg via INTRAVENOUS
  Filled 2021-11-04: qty 1

## 2021-11-04 MED ORDER — PANTOPRAZOLE SODIUM 40 MG IV SOLR
40.0000 mg | Freq: Two times a day (BID) | INTRAVENOUS | Status: DC
  Administered 2021-11-04 – 2021-11-07 (×7): 40 mg via INTRAVENOUS
  Filled 2021-11-04 (×8): qty 10

## 2021-11-04 MED ORDER — METOCLOPRAMIDE HCL 5 MG/ML IJ SOLN
10.0000 mg | Freq: Four times a day (QID) | INTRAMUSCULAR | Status: DC
  Administered 2021-11-04: 10 mg via INTRAVENOUS
  Filled 2021-11-04: qty 2

## 2021-11-04 MED ORDER — HYDROMORPHONE HCL 1 MG/ML IJ SOLN
0.5000 mg | Freq: Once | INTRAMUSCULAR | Status: AC
  Administered 2021-11-04: 0.5 mg via INTRAVENOUS
  Filled 2021-11-04: qty 1

## 2021-11-04 MED ORDER — ACETAMINOPHEN 650 MG RE SUPP: 650.0000 mg | Freq: Four times a day (QID) | RECTAL | Status: DC | PRN

## 2021-11-04 MED ORDER — CARVEDILOL 6.25 MG PO TABS
6.2500 mg | ORAL_TABLET | Freq: Two times a day (BID) | ORAL | Status: DC
  Administered 2021-11-05: 6.25 mg via ORAL
  Filled 2021-11-04: qty 1

## 2021-11-04 MED ORDER — METOPROLOL TARTRATE 5 MG/5ML IV SOLN
5.0000 mg | Freq: Once | INTRAVENOUS | Status: AC
  Administered 2021-11-04: 5 mg via INTRAVENOUS
  Filled 2021-11-04: qty 5

## 2021-11-04 MED ORDER — DROPERIDOL 2.5 MG/ML IJ SOLN
1.2500 mg | Freq: Once | INTRAMUSCULAR | Status: AC
  Administered 2021-11-04: 1.25 mg via INTRAVENOUS
  Filled 2021-11-04: qty 2

## 2021-11-04 MED ORDER — GABAPENTIN 300 MG PO CAPS
300.0000 mg | ORAL_CAPSULE | Freq: Three times a day (TID) | ORAL | Status: DC
  Administered 2021-11-05 – 2021-11-08 (×10): 300 mg via ORAL
  Filled 2021-11-04 (×11): qty 1

## 2021-11-04 MED ORDER — SODIUM CHLORIDE 0.9 % IV BOLUS
1000.0000 mL | Freq: Once | INTRAVENOUS | Status: AC
Start: 2021-11-04 — End: 2021-11-04
  Administered 2021-11-04: 1000 mL via INTRAVENOUS

## 2021-11-04 MED ORDER — INSULIN ASPART 100 UNIT/ML IJ SOLN
0.0000 [IU] | INTRAMUSCULAR | Status: DC
  Administered 2021-11-04: 7 [IU] via SUBCUTANEOUS
  Administered 2021-11-05: 3 [IU] via SUBCUTANEOUS
  Administered 2021-11-05 (×4): 5 [IU] via SUBCUTANEOUS
  Administered 2021-11-05: 2 [IU] via SUBCUTANEOUS
  Administered 2021-11-06: 1 [IU] via SUBCUTANEOUS
  Administered 2021-11-06: 2 [IU] via SUBCUTANEOUS
  Administered 2021-11-06: 3 [IU] via SUBCUTANEOUS
  Administered 2021-11-06: 2 [IU] via SUBCUTANEOUS
  Administered 2021-11-07 (×2): 1 [IU] via SUBCUTANEOUS
  Administered 2021-11-07: 3 [IU] via SUBCUTANEOUS
  Filled 2021-11-04: qty 0.09

## 2021-11-04 MED ORDER — HYDROXYZINE HCL 25 MG PO TABS
25.0000 mg | ORAL_TABLET | Freq: Three times a day (TID) | ORAL | Status: DC | PRN
  Administered 2021-11-05 (×2): 25 mg via ORAL
  Filled 2021-11-04 (×2): qty 1

## 2021-11-04 MED ORDER — LACTATED RINGERS IV SOLN: INTRAVENOUS | Status: DC

## 2021-11-04 MED ORDER — INSULIN DETEMIR 100 UNIT/ML ~~LOC~~ SOLN
16.0000 [IU] | Freq: Every day | SUBCUTANEOUS | Status: DC
  Administered 2021-11-05 – 2021-11-08 (×4): 16 [IU] via SUBCUTANEOUS
  Filled 2021-11-04 (×4): qty 0.16

## 2021-11-04 MED ORDER — PROCHLORPERAZINE EDISYLATE 10 MG/2ML IJ SOLN
10.0000 mg | Freq: Once | INTRAMUSCULAR | Status: AC
  Administered 2021-11-04: 10 mg via INTRAVENOUS
  Filled 2021-11-04: qty 2

## 2021-11-04 MED ORDER — HYDRALAZINE HCL 20 MG/ML IJ SOLN
10.0000 mg | Freq: Once | INTRAMUSCULAR | Status: AC
  Administered 2021-11-04: 10 mg via INTRAVENOUS
  Filled 2021-11-04: qty 1

## 2021-11-04 NOTE — Assessment & Plan Note (Addendum)
Has been made clear to pt from beginning: no narcotics this admission. 1. H/o drug seeking behavior documented by other providers in chart 2. Concern that her gastroparesis and dysmotility is further worsened by narcotic meds. 1. This is also extensively documented by prior providers and GI specialists in chart. 3. For more info, please see extensive discussions by prior hospitalists and GI docs during last admit. 4. Please also see: 1. My admission H+P from Sept 2022 2. Dr. Bridgett Larsson admission H+P from just last month. 3. [facility] DC summary from Sept 2022

## 2021-11-04 NOTE — ED Notes (Signed)
Pt asks this writer if I know "why they wont give dilaudid anymore". I advised that I was unsure and unable to answer her question but would let the RN know she was asking. RN notified.

## 2021-11-04 NOTE — ED Provider Notes (Signed)
[facility] DEPT Provider Note   CSN: [id number] Arrival date & time: 11/04/21  1556     History {Add pertinent medical, surgical, social history, OB history to HPI:1} Chief Complaint  Patient presents with   Abdominal Pain    Kelli Hudson is a 31 y.o. female with history of diabetes on insulin, severe recurrent gastroparesis, presenting to ED with complaint of abdominal pain, nausea vomiting.  Reports onset this morning.  Feels like her prior episodes of gastroparesis, which are quite frequent.  Says it hurts to breathe, hurts to move.  External record review includes gastroenterology office evaluations with the patient is known to have severe recalcitrant gastroparesis, which did not improve after Botox injections.  She has been referred very recently to a specialist for potential gastric stimulator placement.  She uses Compazine at home.  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  She has required hospitalization for severe gastroparesis and poor diabetic control in the past.  Most recently the patient was hospitalized and discharged about 3 weeks ago on 10/14/21 for 48 hours.  Her a1c is 10/3.  She takes levemir 16 units daily  HPI     Home Medications Prior to Admission medications   Medication Sig Start Date End Date Taking? Authorizing Provider  atorvastatin (LIPITOR) 20 MG tablet Take 1 tablet (20 mg total) by mouth daily. 10/29/21   [name], MD  carvedilol (COREG) 6.25 MG tablet Take 1 tablet (6.25 mg total) by mouth 2 (two) times daily with a meal. 10/29/21   Armbruster, Carlota Raspberry, MD  Continuous Blood Gluc Transmit (DEXCOM G6 TRANSMITTER) MISC Change every 90 days 10/29/21   Shamleffer, Melanie Crazier, MD  gabapentin (NEURONTIN) 100 MG capsule Take 1 capsule (100 mg total) by mouth 3 (three) times daily. 10/14/21 11/13/21  Terrilee Croak, MD  hydrOXYzine (VISTARIL) 25 MG capsule Take 1 capsule (25 mg total) by mouth every 8  (eight) hours as needed. Patient taking differently: Take 25 mg by mouth every 8 (eight) hours as needed for anxiety. 07/31/21   Georgette Shell, MD  insulin detemir (LEVEMIR FLEXTOUCH) 100 UNIT/ML FlexPen Inject 16 Units into the skin daily. 06/14/21   Shelly Coss, MD  insulin regular (NOVOLIN R) 100 units/mL injection Inject 0-0.15 mLs (0-15 Units total) into the skin See admin instructions. Via pump.Start this medication after you follow up with your PCP Patient taking differently: Inject 0-15 Units into the skin See admin instructions. Using sliding scale 06/14/21   Shelly Coss, MD  metoCLOPramide (REGLAN) 5 MG tablet Take 1 tablet (5 mg total) by mouth 3 (three) times daily before meals for 7 days. 10/14/21 10/21/21  Terrilee Croak, MD  pantoprazole (PROTONIX) 40 MG tablet Take 1 tablet (40 mg total) by mouth 2 (two) times daily before a meal. 10/29/21   Armbruster, Carlota Raspberry, MD  prochlorperazine (COMPAZINE) 10 MG tablet Take 1/2-1 tablet (5-10 mg total) by mouth every 8 (eight) hours as needed for nausea or vomiting. 10/07/21   Kathie Dike, MD  insulin aspart (NOVOLOG) 100 UNIT/ML FlexPen Inject 5 Units into the skin 3 (three) times daily with meals. 02/23/18 07/05/19  Donne Hazel, MD      Allergies    Patient has no known allergies.    Review of Systems   Review of Systems  Physical Exam Updated Vital Signs BP (!) 171/117   Pulse (!) 123   Temp 99.5 F (37.5 C) (Oral)   Resp 18   SpO2  100%  Physical Exam Constitutional:      General: She is in acute distress.  HENT:     Head: Normocephalic and atraumatic.  Eyes:     Conjunctiva/sclera: Conjunctivae normal.     Pupils: Pupils are equal, round, and reactive to light.  Cardiovascular:     Rate and Rhythm: Regular rhythm. Tachycardia present.  Pulmonary:     Effort: Pulmonary effort is normal. No respiratory distress.  Abdominal:     General: There is no distension.     Tenderness: There is no abdominal tenderness.   Skin:    General: Skin is warm and dry.  Neurological:     General: No focal deficit present.     Mental Status: She is alert. Mental status is at baseline.  Psychiatric:        Mood and Affect: Mood normal.        Behavior: Behavior normal.     ED Results / Procedures / Treatments   Labs (all labs ordered are listed, but only abnormal results are displayed) Labs Reviewed  CBG MONITORING, ED - Abnormal; Notable for the following components:      Result Value   Glucose-Capillary 282 (*)    All other components within normal limits  CBC WITH DIFFERENTIAL/PLATELET  COMPREHENSIVE METABOLIC PANEL  LIPASE, BLOOD  URINALYSIS, ROUTINE W REFLEX MICROSCOPIC  I-STAT BETA HCG BLOOD, ED ([facility], [facility], [facility] ONLY)  I-STAT CHEM 8, ED    EKG None  Radiology No results found.  Procedures Procedures  {Document cardiac monitor, telemetry assessment procedure when appropriate:1}  Medications Ordered in ED Medications  HYDROmorphone (DILAUDID) injection 1 mg (has no administration in time range)  sodium chloride 0.9 % bolus 1,000 mL (has no administration in time range)  prochlorperazine (COMPAZINE) injection 10 mg (has no administration in time range)    ED Course/ Medical Decision Making/ A&P                           Medical Decision Making Risk Prescription drug management.   This patient presents to the ED with concern for persistent nausea vomiting abdominal pain. This involves an extensive number of treatment options, and is a complaint that carries with it a high risk of complications and morbidity.  The differential diagnosis includes to previous most likely for his diabetic complication versus other  Co-morbidities that complicate the patient evaluation: History of diabetes at high risk of diabetic complications, history of gastroparesis  External records from outside source obtained and reviewed including GI office evaluation  I ordered and personally interpreted labs.  The  pertinent results include:  ***  I do not believe the patient is needing emergent CT imaging at this time as this presentation is quite consistent with her prior episodes of gastroparesis.  The patient was maintained on a cardiac monitor.  I personally viewed and interpreted the cardiac monitored which showed an underlying rhythm of: ***  Per my interpretation the patient's ECG shows ***  I ordered medication including IV Dilaudid, IV Compazine, IV fluids for nausea, pain  I have reviewed the patients home medicines and have made adjustments as needed  I requested consultation with the ***,  and discussed lab and imaging findings as well as pertinent plan - they recommend: ***  After the interventions noted above, I reevaluated the patient and found that they have: {resolved/improved/worsened:23923::"improved"}  Social Determinants of Health:***  Dispostion:  After consideration of the diagnostic results and the  patients response to treatment, I feel that the patent would benefit from ***.   {Document critical care time when appropriate:1} {Document review of labs and clinical decision tools ie heart score, Chads2Vasc2 etc:1}  {Document your independent review of radiology images, and any outside records:1} {Document your discussion with family members, caretakers, and with consultants:1} {Document social determinants of health affecting pt's care:1} {Document your decision making why or why not admission, treatments were needed:1} Final Clinical Impression(s) / ED Diagnoses Final diagnoses:  None    Rx / DC Orders ED Discharge Orders     None

## 2021-11-04 NOTE — Assessment & Plan Note (Signed)
Switching protonix to IV for the moment due to intractable N/V.

## 2021-11-04 NOTE — ED Triage Notes (Signed)
Per patient abd pain with n/v since this am Hx gastroparesis

## 2021-11-04 NOTE — Assessment & Plan Note (Addendum)
1. NPO 2. IVF 3. Reglan scheduled 1. Tele monitor given h/o QTc prolongation in past, thankfully QTc < 450 today 4. PRN toradol for pain 5. Please avoid narcotics, These will worsen delay of gastric emptying and prolong her illness.  Please also see discussion below. 66. Hope is for her to follow up with GI su bspecialist at some point for gastric pacemaker 1. Dr. Havery Moros working on this as outpt it looks like 2. Will send message to [facility] letting them know of her admission.

## 2021-11-04 NOTE — Assessment & Plan Note (Signed)
No anion gap on todays BMP 1. Cont home levemir 2. Sensitive SSI Q4H while NPO

## 2021-11-04 NOTE — ED Provider Triage Note (Signed)
Emergency Medicine Provider Triage Evaluation Note  Kelli Hudson , a 31 y.o. female  was evaluated in triage.  Pt complains of constant abdominal pain since around 8 AM this morning.  States this feels the same as prior episodes of gastroparesis.  Frequent ED visits/hospitalizations for same with HTN.  Has not taken anything at home for pain.  Also with vomiting, yellow/clear and nonbloody.  Denies changes in bowel or urination habits.  Denies fever, chills.  Patient walking around the room, then standing, and sitting, every now and then rocks back and forth and states "I can't breathe... it hurts."  Able to answer questions well and without difficulty.  Hx of uncontrolled DMT1 with many complications.  Also with known prolonged QT, depression, and anxiety.  Review of Systems  Positive:  Negative: See above  Physical Exam  BP (!) 171/117   Pulse (!) 123   Temp 99.5 F (37.5 C) (Oral)   Resp 18   SpO2 100%  Gen:   Awake, appears uncomfortable, does not appear to be in acute distress Resp:  Normal effort, CTAB, no wheezing, not tachypneic, communicates without difficulty MSK:   Moves extremities without difficulty, gait appears intact Other:  Abdomen soft, with diffuse tenderness, worst in the epigastric region.  Membranes dry.  Medical Decision Making  Medically screening exam initiated at 4:49 PM.  Appropriate orders placed.  Ericia Moxley was informed that the remainder of the evaluation will be completed by another provider, this initial triage assessment does not replace that evaluation, and the importance of remaining in the ED until their evaluation is complete.    Prince Rome, PA-C 81/10/31 1658

## 2021-11-04 NOTE — ED Notes (Signed)
CBG 320  

## 2021-11-04 NOTE — Assessment & Plan Note (Signed)
Due to diabetic gastroparesis, h/o same.

## 2021-11-04 NOTE — Assessment & Plan Note (Signed)
Resume home coreg when able to take POs. Giving 5mg  IV metoprolol now for tachycardia + hypertension (probably rebound from not being able to keep coreg down). Re-order metoprolol vs labetalol if still unable to take coreg by AM.

## 2021-11-05 ENCOUNTER — Ambulatory Visit: Payer: 59 | Admitting: Family Medicine

## 2021-11-05 ENCOUNTER — Observation Stay (HOSPITAL_COMMUNITY): Payer: 59

## 2021-11-05 DIAGNOSIS — Z800 Family history of malignant neoplasm of digestive organs: Secondary | ICD-10-CM | POA: Diagnosis not present

## 2021-11-05 DIAGNOSIS — R000 Tachycardia, unspecified: Secondary | ICD-10-CM | POA: Diagnosis not present

## 2021-11-05 DIAGNOSIS — E1065 Type 1 diabetes mellitus with hyperglycemia: Secondary | ICD-10-CM

## 2021-11-05 DIAGNOSIS — I169 Hypertensive crisis, unspecified: Secondary | ICD-10-CM | POA: Diagnosis not present

## 2021-11-05 DIAGNOSIS — K449 Diaphragmatic hernia without obstruction or gangrene: Secondary | ICD-10-CM | POA: Diagnosis not present

## 2021-11-05 DIAGNOSIS — R1084 Generalized abdominal pain: Secondary | ICD-10-CM | POA: Diagnosis not present

## 2021-11-05 DIAGNOSIS — K219 Gastro-esophageal reflux disease without esophagitis: Secondary | ICD-10-CM

## 2021-11-05 DIAGNOSIS — Z765 Malingerer [conscious simulation]: Secondary | ICD-10-CM

## 2021-11-05 DIAGNOSIS — R400 Somnolence: Secondary | ICD-10-CM | POA: Diagnosis not present

## 2021-11-05 DIAGNOSIS — K5900 Constipation, unspecified: Secondary | ICD-10-CM | POA: Diagnosis not present

## 2021-11-05 DIAGNOSIS — I152 Hypertension secondary to endocrine disorders: Secondary | ICD-10-CM

## 2021-11-05 DIAGNOSIS — K3184 Gastroparesis: Secondary | ICD-10-CM

## 2021-11-05 DIAGNOSIS — Z801 Family history of malignant neoplasm of trachea, bronchus and lung: Secondary | ICD-10-CM | POA: Diagnosis not present

## 2021-11-05 DIAGNOSIS — G894 Chronic pain syndrome: Secondary | ICD-10-CM | POA: Diagnosis not present

## 2021-11-05 DIAGNOSIS — R112 Nausea with vomiting, unspecified: Secondary | ICD-10-CM | POA: Diagnosis not present

## 2021-11-05 DIAGNOSIS — R7989 Other specified abnormal findings of blood chemistry: Secondary | ICD-10-CM | POA: Diagnosis not present

## 2021-11-05 DIAGNOSIS — R0602 Shortness of breath: Secondary | ICD-10-CM | POA: Diagnosis not present

## 2021-11-05 DIAGNOSIS — Z79899 Other long term (current) drug therapy: Secondary | ICD-10-CM | POA: Diagnosis not present

## 2021-11-05 DIAGNOSIS — E785 Hyperlipidemia, unspecified: Secondary | ICD-10-CM | POA: Diagnosis not present

## 2021-11-05 DIAGNOSIS — D72829 Elevated white blood cell count, unspecified: Secondary | ICD-10-CM | POA: Diagnosis not present

## 2021-11-05 DIAGNOSIS — Z833 Family history of diabetes mellitus: Secondary | ICD-10-CM | POA: Diagnosis not present

## 2021-11-05 DIAGNOSIS — Z818 Family history of other mental and behavioral disorders: Secondary | ICD-10-CM | POA: Diagnosis not present

## 2021-11-05 DIAGNOSIS — E1159 Type 2 diabetes mellitus with other circulatory complications: Secondary | ICD-10-CM | POA: Diagnosis not present

## 2021-11-05 DIAGNOSIS — Z803 Family history of malignant neoplasm of breast: Secondary | ICD-10-CM | POA: Diagnosis not present

## 2021-11-05 DIAGNOSIS — E039 Hypothyroidism, unspecified: Secondary | ICD-10-CM | POA: Diagnosis not present

## 2021-11-05 DIAGNOSIS — E860 Dehydration: Secondary | ICD-10-CM | POA: Diagnosis not present

## 2021-11-05 DIAGNOSIS — E109 Type 1 diabetes mellitus without complications: Secondary | ICD-10-CM | POA: Diagnosis not present

## 2021-11-05 DIAGNOSIS — E1043 Type 1 diabetes mellitus with diabetic autonomic (poly)neuropathy: Secondary | ICD-10-CM | POA: Diagnosis not present

## 2021-11-05 DIAGNOSIS — Z8041 Family history of malignant neoplasm of ovary: Secondary | ICD-10-CM | POA: Diagnosis not present

## 2021-11-05 DIAGNOSIS — Z794 Long term (current) use of insulin: Secondary | ICD-10-CM | POA: Diagnosis not present

## 2021-11-05 DIAGNOSIS — F419 Anxiety disorder, unspecified: Secondary | ICD-10-CM | POA: Diagnosis not present

## 2021-11-05 LAB — GLUCOSE, CAPILLARY
Glucose-Capillary: 186 mg/dL — ABNORMAL HIGH (ref 70–99)
Glucose-Capillary: 222 mg/dL — ABNORMAL HIGH (ref 70–99)
Glucose-Capillary: 255 mg/dL — ABNORMAL HIGH (ref 70–99)
Glucose-Capillary: 259 mg/dL — ABNORMAL HIGH (ref 70–99)
Glucose-Capillary: 261 mg/dL — ABNORMAL HIGH (ref 70–99)
Glucose-Capillary: 299 mg/dL — ABNORMAL HIGH (ref 70–99)

## 2021-11-05 LAB — CBC
HCT: 36.8 % (ref 36.0–46.0)
Hemoglobin: 12 g/dL (ref 12.0–15.0)
MCH: 29.6 pg (ref 26.0–34.0)
MCHC: 32.6 g/dL (ref 30.0–36.0)
MCV: 90.9 fL (ref 80.0–100.0)
Platelets: 446 10*3/uL — ABNORMAL HIGH (ref 150–400)
RBC: 4.05 MIL/uL (ref 3.87–5.11)
RDW: 13.6 % (ref 11.5–15.5)
WBC: 9.6 10*3/uL (ref 4.0–10.5)
nRBC: 0 % (ref 0.0–0.2)

## 2021-11-05 LAB — COMPREHENSIVE METABOLIC PANEL
ALT: 17 U/L (ref 0–44)
AST: 20 U/L (ref 15–41)
Albumin: 3.4 g/dL — ABNORMAL LOW (ref 3.5–5.0)
Alkaline Phosphatase: 76 U/L (ref 38–126)
Anion gap: 12 (ref 5–15)
BUN: 17 mg/dL (ref 6–20)
CO2: 21 mmol/L — ABNORMAL LOW (ref 22–32)
Calcium: 10 mg/dL (ref 8.9–10.3)
Chloride: 105 mmol/L (ref 98–111)
Creatinine, Ser: 1.03 mg/dL — ABNORMAL HIGH (ref 0.44–1.00)
GFR, Estimated: >60 mL/min (ref >60)
Glucose, Bld: 283 mg/dL — ABNORMAL HIGH (ref 70–99)
Potassium: 3.9 mmol/L (ref 3.5–5.1)
Sodium: 138 mmol/L (ref 135–145)
Total Bilirubin: 1.1 mg/dL (ref 0.3–1.2)
Total Protein: 7.2 g/dL (ref 6.5–8.1)

## 2021-11-05 LAB — MAGNESIUM: Magnesium: 1.8 mg/dL (ref 1.7–2.4)

## 2021-11-05 LAB — D-DIMER, QUANTITATIVE: D-Dimer, Quant: 1.59 ug/mL-FEU — ABNORMAL HIGH (ref 0.00–0.50)

## 2021-11-05 LAB — TROPONIN I (HIGH SENSITIVITY)
Troponin I (High Sensitivity): 10 ng/L (ref <18)
Troponin I (High Sensitivity): 13 ng/L (ref <18)

## 2021-11-05 MED ORDER — METOCLOPRAMIDE HCL 5 MG/ML IJ SOLN
10.0000 mg | Freq: Four times a day (QID) | INTRAMUSCULAR | Status: DC
  Administered 2021-11-05: 10 mg via INTRAVENOUS
  Filled 2021-11-05: qty 2

## 2021-11-05 MED ORDER — IOHEXOL 350 MG/ML SOLN
75.0000 mL | Freq: Once | INTRAVENOUS | Status: AC | PRN
  Administered 2021-11-05: 75 mL via INTRAVENOUS

## 2021-11-05 MED ORDER — LABETALOL HCL 5 MG/ML IV SOLN
5.0000 mg | INTRAVENOUS | Status: DC | PRN
  Administered 2021-11-05: 5 mg via INTRAVENOUS
  Filled 2021-11-05 (×2): qty 4

## 2021-11-05 MED ORDER — ACETAMINOPHEN 650 MG RE SUPP: 650.0000 mg | Freq: Four times a day (QID) | RECTAL | Status: DC | PRN

## 2021-11-05 MED ORDER — METOCLOPRAMIDE HCL 5 MG/ML IJ SOLN
20.0000 mg | Freq: Four times a day (QID) | INTRAVENOUS | Status: DC
  Administered 2021-11-05 – 2021-11-06 (×5): 20 mg via INTRAVENOUS
  Filled 2021-11-05 (×8): qty 4

## 2021-11-05 MED ORDER — DICLOFENAC EPOLAMINE 1.3 % EX PTCH
1.0000 | MEDICATED_PATCH | Freq: Two times a day (BID) | CUTANEOUS | Status: DC
  Administered 2021-11-05 – 2021-11-07 (×6): 1 via TRANSDERMAL
  Filled 2021-11-05 (×8): qty 1

## 2021-11-05 MED ORDER — ACETAMINOPHEN 500 MG PO TABS
1000.0000 mg | ORAL_TABLET | Freq: Four times a day (QID) | ORAL | Status: DC | PRN
  Administered 2021-11-05 – 2021-11-06 (×3): 1000 mg via ORAL
  Filled 2021-11-05 (×3): qty 2

## 2021-11-05 MED ORDER — CARVEDILOL 12.5 MG PO TABS
12.5000 mg | ORAL_TABLET | Freq: Two times a day (BID) | ORAL | Status: DC
  Administered 2021-11-05 – 2021-11-08 (×5): 12.5 mg via ORAL
  Filled 2021-11-05 (×6): qty 1

## 2021-11-05 MED ORDER — MELATONIN 3 MG PO TABS
3.0000 mg | ORAL_TABLET | Freq: Every evening | ORAL | Status: DC | PRN
  Administered 2021-11-05 – 2021-11-07 (×4): 3 mg via ORAL
  Filled 2021-11-05 (×5): qty 1

## 2021-11-05 NOTE — Consult Note (Signed)
Referring Provider: No ref. provider found Primary Care Physician:  [name], MD Primary Gastroenterologist:  Dr. Havery Moros  Reason for Consultation: Gastroparesis, intractable nausea/vomiting  HPI: Kelli Hudson is a 31 y.o. female with a past medical history of anxiety, hypertension, hypothyroidism, prolonged QT interval, diabetes mellitus type I with intractable nausea and vomiting secondary to diabetic gastroparesis and GERD.  She has been admitted to the hospital numerous times over the past year secondary to intractable nausea and vomiting.  She is followed by Dr. Havery Moros.  She has complex gastroparesis which has been challenging to control. She has been on Zofran, Phenergan and Compazine without improvement.  She is not a candidate for Erythromycin, Reglan or Domperidone secondary to prolonged QTc.  Her most recent EGD July 2023 during one of her hospitalizations was unrevealing and she underwent empiric Botox injections at that time which was ineffective.  She was recently referred to gastroparesis specialist Dr. Vickki Muff at [facility] in [place] for gastric pacemaker placement, appointment has not yet been scheduled.  She was admitted to the hospital 8/23 - 10/11/2021 with intractable nausea and vomiting.  She received IV Reglan, Dilaudid and Robaxin.  Her clinical status eventually stabilized and she was advanced to a soft diet and she was discharged home 10/11/2021 on Dilaudid 2 mg every 8 hours p.o. x3 days, Reglan 5 mg 3 times daily, Compazine 10 mg every 8 hours as needed and pantoprazole 40 mg twice daily.  She presented to visit [place] ED 11/04/2021 with recurrent intractable nausea/vomiting with chest and generalized abdominal pain.  Labs in the ED showed a WBC count 8.6.  Hemoglobin 11.4.  Platelet 476.  Glucose 323.  BUN 18.  Creatinine 1.22 (Cr 1.17 on 8/27).  Total bili 1.4.  Alk phos 81.  AST 24.  ALT 19.  Lipase 26.  hCG less than 5.  D-dimer elevated at 1.59.  Troponin 10 and 13.  Chest x-ray negative.  Chest CTA negative for PE.  Abdominal imaging has not been done.  She developed worsening chest and generalized abdominal pain yesterday followed by nausea with nonstop vomiting.  Emesis described as clear without hematemesis.  She complains of having severe constant abdominal pain 10 out of 10 which she is requesting Dilaudid which is the only pain medication that provides relief.  At home, she is eating mostly applesauce and broth.  She is passing a brown bowel movement every 2 days.  No rectal bleeding or black stools.  She is having intermittent heartburn without dysphagia.  No NSAID use.  She has been off her insulin pump for months and is taking Levemir insulin 16 units daily at home.  GI PROCEDURES:  EGD 08/26/2021: - Normal larynx. - Normal esophagus. - Normal stomach. Clinical - Normal stomach. Clinical question of pyloric spasm - pylorus injected with botulinum toxin. - Normal examined duodenum. - No specimens collected.  EGD 01/24/2020: - Z-line regular, 37 cm from the incisors. - Gastritis. Biopsied. - Normal duodenal bulb, first portion of the duodenum and second portion of the Duodenum  EGD 09/08/2015: - LA Grade B esophagitis. - Gastritis. Biopsied. - Normal duodenal bulb, first portion of the duodenum and second portion of the duodenum. - Suspect gastritis and esophagitis are function of the patient's recurrent nausea and vomiting, and less convinced that they are in any way linked causally to her symptoms.  MOST RECENT IMAGE STUDIES:   CTAP 08/24/2021: 1. Mild diffuse colonic wall thickening with surrounding fat stranding suggesting  colitis. 2. Prominent appendix with no surrounding inflammatory changes. Findings are similar in appearance to the prior exam and likely reflect a normal variant. Correlate clinically to exclude appendicitis.   Expected location of the stomach in the left upper  quadrant. Ingested meal empties the stomach gradually over the course of the study.   0% emptied at 1 hr ( normal >= 10%)   6% emptied at 2 hr ( normal >= 40%)   6% emptied at 3 hr ( normal >= 70%)   6% emptied at 4 hr ( normal >= 90%)   IMPRESSION: Severely delayed gastric emptying study.  Past Medical History:  Diagnosis Date   Acute H. pylori gastric ulcer    Coffee ground emesis    Diabetes mellitus ([place])    Diabetic gastroparesis ([place])    DKA (diabetic ketoacidosis) ([place]) 02/24/2021   Gastroparesis    GERD (gastroesophageal reflux disease)    Hypertension    Hyperthyroidism    Intractable nausea and vomiting 04/20/2021   Normocytic anemia 04/20/2020   Prolonged Q-T interval on ECG     Past Surgical History:  Procedure Laterality Date   AMPUTATION TOE Left 03/10/2018   Procedure: AMPUTATION FIFTH TOE;  Surgeon: Trula Slade, DPM;  Location: [facility];  Service: Podiatry;  Laterality: Left;   BIOPSY  01/28/2020   Procedure: BIOPSY;  Surgeon: Otis Brace, MD;  Location: [facility];  Service: Gastroenterology;;   BOTOX INJECTION  08/26/2021   Procedure: BOTOX INJECTION;  Surgeon: Doran Stabler, MD;  Location: [facility];  Service: Gastroenterology;;   ESOPHAGOGASTRODUODENOSCOPY N/A 01/28/2020   Procedure: ESOPHAGOGASTRODUODENOSCOPY (EGD);  Surgeon: Otis Brace, MD;  Location: [facility];  Service: Gastroenterology;  Laterality: N/A;   ESOPHAGOGASTRODUODENOSCOPY (EGD) WITH PROPOFOL Left 09/08/2015   Procedure: ESOPHAGOGASTRODUODENOSCOPY (EGD) WITH PROPOFOL;  Surgeon: Arta Silence, MD;  Location: [facility];  Service: Endoscopy;  Laterality: Left;   ESOPHAGOGASTRODUODENOSCOPY (EGD) WITH PROPOFOL N/A 08/26/2021   Procedure: ESOPHAGOGASTRODUODENOSCOPY (EGD) WITH PROPOFOL;  Surgeon: Doran Stabler, MD;  Location: [facility];  Service: Gastroenterology;  Laterality: N/A;   WISDOM TOOTH EXTRACTION      Prior to Admission  medications   Medication Sig Start Date End Date Taking? Authorizing Provider  atorvastatin (LIPITOR) 20 MG tablet Take 1 tablet (20 mg total) by mouth daily. 10/29/21  Yes [name], MD  carvedilol (COREG) 6.25 MG tablet Take 1 tablet (6.25 mg total) by mouth 2 (two) times daily with a meal. 10/29/21  Yes Armbruster, Carlota Raspberry, MD  dicyclomine (BENTYL) 20 MG tablet Take 20 mg by mouth 3 (three) times daily as needed for spasms. 10/24/21  Yes [provider]  erythromycin base (E-MYCIN) 500 MG tablet Take 500 mg by mouth 3 (three) times daily. 10/28/21  Yes [provider]  gabapentin (NEURONTIN) 300 MG capsule Take 300 mg by mouth 3 (three) times daily. 10/24/21  Yes [provider]  hydrOXYzine (VISTARIL) 25 MG capsule Take 1 capsule (25 mg total) by mouth every 8 (eight) hours as needed. Patient taking differently: Take 25 mg by mouth every 8 (eight) hours as needed for anxiety. 07/31/21  Yes Georgette Shell, MD  insulin detemir (LEVEMIR FLEXTOUCH) 100 UNIT/ML FlexPen Inject 16 Units into the skin daily. 06/14/21  Yes Shelly Coss, MD  insulin regular (NOVOLIN R) 100 units/mL injection Inject 0-0.15 mLs (0-15 Units total) into the skin See admin instructions. Via pump.Start this medication after you follow up with your PCP Patient taking differently:  Inject 0-15 Units into the skin See admin instructions. Using sliding scale 06/14/21  Yes Shelly Coss, MD  metoCLOPramide (REGLAN) 10 MG tablet Take 10 mg by mouth every 8 (eight) hours as needed for nausea or vomiting. 10/24/21  Yes [provider]  promethazine (PHENERGAN) 12.5 MG tablet Take 12.5 mg by mouth every 6 (six) hours as needed for nausea or vomiting. 10/24/21  Yes [provider]  Continuous Blood Gluc Transmit (DEXCOM G6 TRANSMITTER) MISC Change every 90 days 10/29/21   Shamleffer, Melanie Crazier, MD  pantoprazole (PROTONIX) 40 MG tablet Take 1 tablet (40 mg total) by mouth 2 (two) times  daily before a meal. Patient not taking: Reported on 11/04/2021 10/29/21   Armbruster, Carlota Raspberry, MD  prochlorperazine (COMPAZINE) 10 MG tablet Take 1/2-1 tablet (5-10 mg total) by mouth every 8 (eight) hours as needed for nausea or vomiting. 10/07/21   Kathie Dike, MD  insulin aspart (NOVOLOG) 100 UNIT/ML FlexPen Inject 5 Units into the skin 3 (three) times daily with meals. 02/23/18 07/05/19  Donne Hazel, MD    Current Facility-Administered Medications  Medication Dose Route Frequency Provider Last Rate Last Admin   acetaminophen (TYLENOL) tablet 650 mg  650 mg Oral Q6H PRN Etta Quill, DO       Or   acetaminophen (TYLENOL) suppository 650 mg  650 mg Rectal Q6H PRN Etta Quill, DO       atorvastatin (LIPITOR) tablet 20 mg  20 mg Oral Daily Alcario Drought, Jared M, DO       carvedilol (COREG) tablet 6.25 mg  6.25 mg Oral BID WC Jennette Kettle M, DO       gabapentin (NEURONTIN) capsule 300 mg  300 mg Oral TID Etta Quill, DO       hydrOXYzine (ATARAX) tablet 25 mg  25 mg Oral Q8H PRN Etta Quill, DO   25 mg at 11/05/21 0139   insulin aspart (novoLOG) injection 0-9 Units  0-9 Units Subcutaneous Q4H Jennette Kettle M, DO   5 Units at 11/05/21 0410   insulin detemir (LEVEMIR) injection 16 Units  16 Units Subcutaneous Daily Alcario Drought, Jared M, DO       ketorolac (TORADOL) 30 MG/ML injection 30 mg  30 mg Intravenous Q6H PRN Etta Quill, DO   30 mg at 11/05/21 0601   labetalol (NORMODYNE) injection 5-10 mg  5-10 mg Intravenous Q2H PRN Etta Quill, DO   5 mg at 11/05/21 0501   lactated ringers infusion   Intravenous Continuous Etta Quill, DO 125 mL/hr at 11/05/21 0532 New Bag at 11/05/21 0532   melatonin tablet 3 mg  3 mg Oral QHS PRN Etta Quill, DO   3 mg at 11/05/21 0035   metoCLOPramide (REGLAN) injection 10 mg  10 mg Intravenous Q6H Jennette Kettle M, DO   10 mg at 11/05/21 0409   pantoprazole (PROTONIX) injection 40 mg  40 mg Intravenous Q12H Jennette Kettle M, DO    40 mg at 11/04/21 2337    Allergies as of 11/04/2021   (No Known Allergies)    Family History  Problem Relation Age of Onset   Lung cancer Mother    Diabetes Mother    Bipolar disorder Father    Liver cancer Maternal Grandfather    Diabetes Maternal Aunt        x 2   Pancreatic cancer Maternal Uncle    Prostate cancer Maternal Uncle    Breast cancer Other  maternal great aunt   Heart disease Other    Ovarian cancer Other        maternal great aunt   Kidney disease Other        maternal great aunt   Colon cancer Neg Hx    Stomach cancer Neg Hx     Social History   Socioeconomic History   Marital status: Single    Spouse name: Not on file   Number of children: 0   Years of education: Not on file   Highest education level: Not on file  Occupational History   Occupation: Estate manager/land agent  Tobacco Use   Smoking status: Never   Smokeless tobacco: Never  Vaping Use   Vaping Use: Never used  Substance and Sexual Activity   Alcohol use: Yes    Alcohol/week: 1.0 standard drink of alcohol    Types: 1 Shots of liquor per week    Comment: occasional    Drug use: No   Sexual activity: Yes    Birth control/protection: None, Condom    Comment: nexiplanon  Other Topics Concern   Not on file  Social History Narrative   Not on file   Social Determinants of Health   Financial Resource Strain: Low Risk  (05/29/2021)   Overall Financial Resource Strain (CARDIA)    Difficulty of Paying Living Expenses: Not very hard  Food Insecurity: Food Insecurity Present (11/04/2021)   Hunger Vital Sign    Worried About Running Out of Food in the Last Year: Sometimes true    Ran Out of Food in the Last Year: Never true  Transportation Needs: Unmet Transportation Needs (11/04/2021)   PRAPARE - Hydrologist (Medical): Yes    Lack of Transportation (Non-Medical): No  Physical Activity: Not on file  Stress: Not on file  Social Connections: Moderately  Integrated (05/29/2021)   Social Connection and Isolation Panel [NHANES]    Frequency of Communication with Friends and Family: More than three times a week    Frequency of Social Gatherings with Friends and Family: More than three times a week    Attends Religious Services: More than 4 times per year    Active Member of [employer] or Organizations: Yes    Attends Archivist Meetings: More than 4 times per year    Marital Status: Never married  Intimate Partner Violence: Not At Risk (11/04/2021)   Humiliation, Afraid, Rape, and Kick questionnaire    Fear of Current or Ex-Partner: No    Emotionally Abused: No    Physically Abused: No    Sexually Abused: No    Review of Systems: Gen: Denies fever, sweats or chills. No weight loss.  CV: Denies chest pain, palpitations or edema. Resp: Denies cough, shortness of breath of hemoptysis.  GI: See HPI. GU : Denies urinary burning, blood in urine, increased urinary frequency or incontinence. MS: Denies joint pain, muscles aches or weakness. Derm: Denies rash, itchiness, skin lesions or unhealing ulcers. Psych: + Anxiety.  Heme: Denies easy bruising, bleeding. Neuro:  Denies headaches, dizziness or paresthesias. Endo:  + Thyroid disease, DM I.  Physical Exam: Vital signs in last 24 hours: Temp:  [98.4 F (36.9 C)-99.5 F (37.5 C)] 98.5 F (36.9 C) (09/19 0458) Pulse Rate:  [118-146] 121 (09/19 0607) Resp:  [12-23] 20 (09/19 0458) BP: (160-190)/(100-121) 166/118 (09/19 0607) SpO2:  [98 %-100 %] 100 % (09/19 0607) Weight:  [79.2 kg] 79.2 kg (09/19 0008)   General: Alert 31 year old  female tearful in no acute distress. Head:  Normocephalic and atraumatic. Eyes:  No scleral icterus. Conjunctiva pink. Ears:  Normal auditory acuity. Nose:  No deformity, discharge or lesions. Mouth:  Dentition intact. No ulcers or lesions.  Neck:  Supple. No lymphadenopathy or thyromegaly.  Lungs: Breath sounds clear throughout. Heart: Tachycardic,  normal rhythm.  No murmurs. Abdomen: Diffuse tenderness throughout without rebound or guarding.  Nondistended.  Positive bowel sounds all 4 quadrants. Rectal: Deferred. Musculoskeletal:  Symmetrical without gross deformities.  Pulses:  Normal pulses noted. Extremities:  Without clubbing or edema. Neurologic:  Alert and  oriented x 4. No focal deficits.  Skin:  Intact without significant lesions or rashes. Psych:  Alert and cooperative. Normal mood and affect.  Intake/Output from previous day: 09/18 0701 - 09/19 0700 In: 1966.7 [I.V.:966.7; IV Piggyback:1000] Out: -  Intake/Output this shift: No intake/output data recorded.  Lab Results: Recent Labs    11/04/21 1745 11/04/21 1808 11/05/21 0310  WBC 8.6  --  9.6  HGB 11.4* 12.2 12.0  HCT 35.2* 36.0 36.8  PLT 476*  --  446*   BMET Recent Labs    11/04/21 1745 11/04/21 1808 11/05/21 0310  NA 138 136 138  K 3.8 3.9 3.9  CL 106 105 105  CO2 19*  --  21*  GLUCOSE 323* 330* 283*  BUN _0 CREATININE 1.22* 1.10* 1.03*  CALCIUM 10.1  --  10.0   LFT Recent Labs    11/05/21 0310  PROT 7.2  ALBUMIN 3.4*  AST 20  ALT 17  ALKPHOS 76  BILITOT 1.1   PT/INR No results for input(s): "LABPROT", "INR" in the last 72 hours. Hepatitis Panel No results for input(s): "HEPBSAG", "HCVAB", "HEPAIGM", "HEPBIGM" in the last 72 hours.    Studies/Results: CT Angio Chest Pulmonary Embolism (PE) W or WO Contrast  Result Date: 11/05/2021 CLINICAL DATA:  Pulmonary embolism suspected. Positive D-dimer. 31 year old female. EXAM: CT ANGIOGRAPHY CHEST WITH CONTRAST TECHNIQUE: Multidetector CT imaging of the chest was performed using the standard protocol during bolus administration of intravenous contrast. Multiplanar CT image reconstructions and MIPs were obtained to evaluate the vascular anatomy. RADIATION DOSE REDUCTION: This exam was performed according to the departmental dose-optimization program which includes automated exposure  control, adjustment of the mA and/or kV according to patient size and/or use of iterative reconstruction technique. CONTRAST:  24m OMNIPAQUE IOHEXOL 350 MG/ML SOLN COMPARISON:  Portable chest today, PA Lat chest 11/04/2021, CTA chest 11/16/2020, CTA chest 08/22/2016. FINDINGS: Cardiovascular: The cardiac size is normal. There is no pericardial effusion. The pulmonary arteries and veins are normal caliber. No arterial embolus is seen, although please note the subsegmental arterial bed in the lower lung fields is obscured due to breathing motion. The aorta and great vessels are normal. There is no visible coronary artery calcification. Mediastinum/Nodes: No enlarged mediastinal, hilar, or axillary lymph nodes. Thyroid gland, trachea, and esophagus demonstrate no significant findings. There is a small hiatal hernia. 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 Mild chronic elevation right hemidiaphragm. Lungs/Pleura: Lungs are clear. No pleural effusion or pneumothorax. Upper Abdomen: No acute abnormality. Musculoskeletal: No chest wall abnormality. No acute or significant osseous findings. Review of the MIP images confirms the above findings. IMPRESSION: No acute chest CT or CTA findings, but with nonvisualization of the lower zonal subsegmental arteries due to breathing motion. Electronically Signed   By: KTelford NabM.D.   On: 11/05/2021 05:45   DG CHEST PORT 1 VIEW  Result Date:  11/05/2021 CLINICAL DATA:  Increasing shortness of breath EXAM: PORTABLE CHEST 1 VIEW COMPARISON:  11/04/2021 FINDINGS: The heart size and mediastinal contours are within normal limits. Both lungs are clear. The visualized skeletal structures are unremarkable. IMPRESSION: No active disease. Electronically Signed   By: Inez Catalina M.D.   On: 11/05/2021 02:38   DG Chest 2 View  Result Date: 11/04/2021 CLINICAL DATA:  Shortness of breath, chest pain. EXAM: CHEST - 2 VIEW COMPARISON:  October 09, 2021. FINDINGS: The heart  size and mediastinal contours are within normal limits. Both lungs are clear. The visualized skeletal structures are unremarkable. IMPRESSION: No active cardiopulmonary disease. Electronically Signed   By: Marijo Conception M.D.   On: 11/04/2021 17:47    IMPRESSION/PLAN:  78) 31 year old female with diabetes mellitus type 1 readmitted to the hospital with with chronic intractable nausea/vomiting secondary to severe gastroparesis with chest and generalized abdominal pain. CTA negative for PE. Abdominal imaging has not been done this admission.  On Toradol 30 mg IV every 6 hours.  Prolonged QTC interval limits antiemetic options. -Clear liquid diet -Continue LR IV at 125 cc/hour -On Reglan 10 mg IV every 6 hours, check QTc daily -? Repeat CTAP -Consult for venting gastrostomy tube/jejunostomy feeding tube -Recently referred to gastroparesis specialist Dr. Vickki Muff at [facility] in [place] for gastric pacemaker placement, appointment has not yet been scheduled. -Pain management per the hospitalist, patient specifically asking for Dilaudid  2) GERD -PPI IV bid      Patrecia Pour [facility]  11/05/2021, 09:44 AM

## 2021-11-05 NOTE — Progress Notes (Signed)
PT requesting to walk the halls. This RN educated patient on safety risk of walking halls with increased HR, BP and if feeling SHOB. PT stated understanding. PT currently walking halls despite RNs education.

## 2021-11-05 NOTE — Progress Notes (Signed)
   11/04/21 2336  Assess: MEWS Score  Temp 98.4 F (36.9 C)  BP (!) 171/110  MAP (mmHg) 128  Pulse Rate (!) 132  Resp 18  Level of Consciousness Alert  SpO2 100 %  O2 Device Room Air  Assess: MEWS Score  MEWS Temp 0  MEWS Systolic 0  MEWS Pulse 3  MEWS RR 0  MEWS LOC 0  MEWS Score 3  MEWS Score Color Yellow  Assess: if the MEWS score is Yellow or Red  Were vital signs taken at a resting state? Yes  Focused Assessment No change from prior assessment  Does the patient meet 2 or more of the SIRS criteria? Yes  Does the patient have a confirmed or suspected source of infection? No  MEWS guidelines implemented *See Row Information* Yes  Treat  MEWS Interventions Administered scheduled meds/treatments (Dr Alcario Drought at bedside)  Pain Scale 0-10  Pain Score 10  Pain Type Acute pain  Pain Location Abdomen  Pain Orientation Mid  Pain Descriptors / Indicators Discomfort  Pain Frequency Constant  Pain Onset On-going  Patients Stated Pain Goal 0  Pain Intervention(s) MD notified (Comment);Rest;Relaxation  Take Vital Signs  Increase Vital Sign Frequency  Yellow: Q 2hr X 2 then Q 4hr X 2, if remains yellow, continue Q 4hrs  Escalate  MEWS: Escalate Yellow: discuss with charge nurse/RN and consider discussing with provider and RRT  Notify: Charge Nurse/RN  Name of Charge Nurse/RN Notified [name]  Date Charge Nurse/RN Notified 11/04/21  Time Charge Nurse/RN Notified 2330  Notify: Provider  Provider Name/Title Alcario Drought  Date Provider Notified 11/04/21  Time Provider Notified 2352  Method of Notification  (at bedside)  Notification Reason  (BP 171/110 HR 132)  Provider response Other (Comment) (metoprolol given)  Date of Provider Response 11/04/21  Time of Provider Response 2353  Assess: SIRS CRITERIA  SIRS Temperature  0  SIRS Pulse 1  SIRS Respirations  0  SIRS WBC 1  SIRS Score Sum  2

## 2021-11-05 NOTE — Progress Notes (Signed)
Patient ID: Kelli Hudson, female   DOB: 1990/12/10, 31 y.o.   MRN: [id number] Request received for venting G/J tube on pt. Case reviewed by Dr. Annamaria Boots.  He agrees with looking into gastric pacemaker but we do not place straight J tubes and if that or venting G tube is needed recommend surgical consult as can be done together. Please contact Dr. Annamaria Boots at pager [phone] with any additional questions.

## 2021-11-05 NOTE — Assessment & Plan Note (Signed)
S.Tach and hypertensive since presentation to ED. Unfortunately this has been persistent and unchanged since presentation to ED despite interventions tried including: 1L IVF bolus 1.5mg  IV dilaudid in ED 5mg  IV metoprolol Looks like she had S tach throughout most of prior admit too Plan 1. Tele monitor 2. Trying 2nd 1L bolus to see if any improvement in HR

## 2021-11-05 NOTE — Progress Notes (Signed)
Though clinically SHOB doesn't seem that severe (pt more ambulating halls recently than severely [facility] it seems per RN note).  D.Dimer did come back positive, and she remains persistently tachycardic to 130s despite all attempts to improve tachycardia.  Remainder of work up ordered at 0200 has come back unremarkable thus far.  Will get CTA chest to r/o PE.

## 2021-11-05 NOTE — Progress Notes (Signed)
PT complaining of difficulty breathing. Oxygen saturations 100% on RA Lung sounds clear. RR 14 BP 160/104 HR 118-130. EKG done. Dr Alcario Drought made aware. See new orders. CXR, Labs ordered now

## 2021-11-05 NOTE — Telephone Encounter (Signed)
2.19.23 no show letter sent

## 2021-11-05 NOTE — H&P (Addendum)
History and Physical    Patient: Kelli Hudson FAO:[id number] DOB: 1990-12-28 DOA: 11/04/2021 DOS: the patient was seen and examined on 11/05/2021 PCP: Loyola Mastudd, Stephen M, MD  Patient coming from: Home  Chief Complaint:  Chief Complaint  Patient presents with   Abdominal Pain   HPI: Kelli Hudson is a 31 y.o. 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  Very frequent admissions to hospital for gastroparesis exacerbations including 13 admits for gastroparesis since Jan not including today (Today being #14).  Last admit 8/23-8/28: complicated by QT prolongation with reglan limiting treatment some.  As noted in discharge summary there were "multiple conversation between providers, patient and her mother about the risk and benefit of Dilaudid."  Dr. Adela LankArmbruster saw her in office just 7 days ago, he is working to get her to see a subspecialist for possible gastric pacemaker placement.  Pt presents to ED today with c/o abd pain, N/V.  Onset this AM.  This feels identical to prior episodes of gastroparesis she states.  Hurts to breathe and move.  She is no longer on scheduled reglan as outpt due to QTc concerns.  In ED today: Thankfully QTc not significantly prolonged in ED today.  Given 1.5mg  dilaudid by ED provider without much benefit at all (still actively vomiting uncontrollably a short while later).  Given Droperidol.  Review of Systems: As mentioned in the history of present illness. All other systems reviewed and are negative. Past Medical History:  Diagnosis Date   Acute H. pylori gastric ulcer    Coffee ground emesis    Diabetes mellitus (HCC)    Diabetic gastroparesis (HCC)    DKA (diabetic ketoacidosis) (HCC) 02/24/2021   Gastroparesis    GERD (gastroesophageal reflux disease)    Hypertension    Hyperthyroidism    Intractable nausea and vomiting 04/20/2021   Normocytic anemia 04/20/2020    Prolonged Q-T interval on ECG    Past Surgical History:  Procedure Laterality Date   AMPUTATION TOE Left 03/10/2018   Procedure: AMPUTATION FIFTH TOE;  Surgeon: Vivi BarrackWagoner, Matthew R, DPM;  Location: [facility];  Service: Podiatry;  Laterality: Left;   BIOPSY  01/28/2020   Procedure: BIOPSY;  Surgeon: Kathi DerBrahmbhatt, Parag, MD;  Location: [facility];  Service: Gastroenterology;;   BOTOX INJECTION  08/26/2021   Procedure: BOTOX INJECTION;  Surgeon: Sherrilyn Ristanis, Henry L III, MD;  Location: [facility];  Service: Gastroenterology;;   ESOPHAGOGASTRODUODENOSCOPY N/A 01/28/2020   Procedure: ESOPHAGOGASTRODUODENOSCOPY (EGD);  Surgeon: Kathi DerBrahmbhatt, Parag, MD;  [facility];  Service: Gastroenterology;  Laterality: N/A;   ESOPHAGOGASTRODUODENOSCOPY (EGD) WITH PROPOFOL Left 09/08/2015   Procedure: ESOPHAGOGASTRODUODENOSCOPY (EGD) WITH PROPOFOL;  Surgeon: Willis ModenaWilliam Outlaw, MD;  [facility];  Service: Endoscopy;  Laterality: Left;   ESOPHAGOGASTRODUODENOSCOPY (EGD) WITH PROPOFOL N/A 08/26/2021   Procedure: ESOPHAGOGASTRODUODENOSCOPY (EGD) WITH PROPOFOL;  Surgeon: Sherrilyn Ristanis, Henry L III, MD;  Location: [facility];  Service: Gastroenterology;  Laterality: N/A;   WISDOM TOOTH EXTRACTION     Social History:  reports that she has never smoked. She has never used smokeless tobacco. She reports current alcohol use of about 1.0 standard drink of alcohol per week. She reports that she does not use drugs.  No Known Allergies  Family History  Problem Relation Age of Onset   Lung cancer Mother    Diabetes Mother    Bipolar disorder Father    Liver cancer Maternal Grandfather    Diabetes Maternal Aunt  x 2   Pancreatic cancer Maternal Uncle    Prostate cancer Maternal Uncle    Breast cancer Other        maternal great aunt   Heart disease Other    Ovarian cancer Other        maternal great aunt   Kidney disease Other        maternal great aunt   Colon cancer Neg Hx    Stomach  cancer Neg Hx     Prior to Admission medications   Medication Sig Start Date End Date Taking? Authorizing Provider  atorvastatin (LIPITOR) 20 MG tablet Take 1 tablet (20 mg total) by mouth daily. 10/29/21  Yes Loyola Mast, MD  carvedilol (COREG) 6.25 MG tablet Take 1 tablet (6.25 mg total) by mouth 2 (two) times daily with a meal. 10/29/21  Yes Armbruster, Willaim Rayas, MD  dicyclomine (BENTYL) 20 MG tablet Take 20 mg by mouth 3 (three) times daily as needed for spasms. 10/24/21  Yes [provider]  erythromycin base (E-MYCIN) 500 MG tablet Take 500 mg by mouth 3 (three) times daily. 10/28/21  Yes [provider]  gabapentin (NEURONTIN) 300 MG capsule Take 300 mg by mouth 3 (three) times daily. 10/24/21  Yes [provider]  hydrOXYzine (VISTARIL) 25 MG capsule Take 1 capsule (25 mg total) by mouth every 8 (eight) hours as needed. Patient taking differently: Take 25 mg by mouth every 8 (eight) hours as needed for anxiety. 07/31/21  Yes Alwyn Ren, MD  insulin detemir (LEVEMIR FLEXTOUCH) 100 UNIT/ML FlexPen Inject 16 Units into the skin daily. 06/14/21  Yes Burnadette Pop, MD  insulin regular (NOVOLIN R) 100 units/mL injection Inject 0-0.15 mLs (0-15 Units total) into the skin See admin instructions. Via pump.Start this medication after you follow up with your PCP Patient taking differently: Inject 0-15 Units into the skin See admin instructions. Using sliding scale 06/14/21  Yes Burnadette Pop, MD  metoCLOPramide (REGLAN) 10 MG tablet Take 10 mg by mouth every 8 (eight) hours as needed for nausea or vomiting. 10/24/21  Yes [provider]  promethazine (PHENERGAN) 12.5 MG tablet Take 12.5 mg by mouth every 6 (six) hours as needed for nausea or vomiting. 10/24/21  Yes [provider]  Continuous Blood Gluc Transmit (DEXCOM G6 TRANSMITTER) MISC Change every 90 days 10/29/21   Shamleffer, Konrad Dolores, MD  pantoprazole (PROTONIX) 40 MG tablet Take 1 tablet  (40 mg total) by mouth 2 (two) times daily before a meal. Patient not taking: Reported on 11/04/2021 10/29/21   Armbruster, Willaim Rayas, MD  prochlorperazine (COMPAZINE) 10 MG tablet Take 1/2-1 tablet (5-10 mg total) by mouth every 8 (eight) hours as needed for nausea or vomiting. 10/07/21   Erick Blinks, MD  insulin aspart (NOVOLOG) 100 UNIT/ML FlexPen Inject 5 Units into the skin 3 (three) times daily with meals. 02/23/18 07/05/19  Jerald Kief, MD    Physical Exam: Vitals:   11/04/21 2215 11/04/21 2220 11/04/21 2245 11/04/21 2336  BP: (!) 162/100  (!) 175/103 (!) 171/110  Pulse: (!) 129 (!) 129 (!) 129 (!) 132  Resp: 13 14 14 18   Temp:    98.4 F (36.9 C)  TempSrc:      SpO2: 98% 98% 98% 100%   Constitutional: Uncomfortable but not in distress at time of my evaluation Eyes: PERRL, lids and conjunctivae normal ENMT: Mucous membranes are moist. Posterior pharynx clear of any exudate or lesions.Normal dentition.  Neck: normal, supple, no masses,  no thyromegaly Respiratory: clear to auscultation bilaterally, no wheezing, no crackles. Normal respiratory effort. No accessory muscle use.  Cardiovascular: Tachycardic, no murmurs Abdomen: epigastric TTP Musculoskeletal: no clubbing / cyanosis. No joint deformity upper and lower extremities. Good ROM, no contractures. Normal muscle tone.  Skin: no rashes, lesions, ulcers. No induration Neurologic: CN 2-12 grossly intact. Sensation intact, DTR normal. Strength 5/5 in all 4.  Psychiatric: AAOx3, goal oriented thinking, repeated asking for dilaudid by name, unhappy when I had to say "no" to the dilaudid and explained why it wasn't a good idea.  Note: Rosalio Macadamia, RN.  Was present at bedside for all parts of H+P and my discussion with the patient  Data Reviewed:       Latest Ref Rng & Units 11/04/2021    6:08 PM 11/04/2021    5:45 PM 10/13/2021    5:24 AM  CBC  WBC 4.0 - 10.5 K/uL  8.6  9.3   Hemoglobin 12.0 - 15.0 g/dL 97.0  26.3  9.8    Hematocrit 36.0 - 46.0 % 36.0  35.2  30.0   Platelets 150 - 400 K/uL  476  324       Latest Ref Rng & Units 11/04/2021    6:08 PM 11/04/2021    5:45 PM 10/13/2021    5:24 AM  CMP  Glucose 70 - 99 mg/dL 785  885  027   BUN 6 - 20 mg/dL 15  18  11    Creatinine 0.44 - 1.00 mg/dL  7.41  2.87   Sodium 135 - 145 mmol/L 136  138  138   Potassium 3.5 - 5.1 mmol/L 3.9  3.8  3.5   Chloride 98 - 111 mmol/L 105  106  106   CO2 22 - 32 mmol/L  19  26   Calcium 8.9 - 10.3 mg/dL  8.67  8.8   Total Protein 6.5 - 8.1 g/dL  7.8    Total Bilirubin 0.3 - 1.2 mg/dL  1.4    Alkaline Phos 38 - 126 U/L  81    AST 15 - 41 U/L  24    ALT 0 - 44 U/L  19     EKG: HR 130, QTc 440,   Hr improved to 121 QT completes less than half the distance R to R on my review of monitor at time of this note.  Assessment and Plan: * Diabetic gastroparesis associated with type 1 diabetes mellitus (HCC) NPO IVF Reglan scheduled Tele monitor given h/o QTc prolongation in past, thankfully QTc < 450 today Defer IV erythromycin for the moment to day team / GI (want to see how QTc responds to reglan first I think) PRN toradol for pain Please avoid narcotics, These will worsen delay of gastric emptying and prolong her illness. Long discussion with patient as to why. Please see discussion below for details. Hope is for her to follow up with GI su bspecialist at some point for gastric pacemaker Dr. 67.2 working on this as outpt it looks like Will send message to [facility] letting them know of her admission.  Intractable nausea and vomiting Due to diabetic gastroparesis, h/o same.  Drug-seeking behavior Has been made clear to pt from beginning: no narcotics this admission. Concern for drug seeking behavior / developing narcotic dependence documented by other providers in chart Concern that her gastroparesis and dysmotility is further worsened by narcotic meds. This is also extensively documented by prior providers  and GI specialists in chart. For  more info, please see extensive discussions by prior hospitalists and GI docs during last admit (briefly noted a couple of these below). Please also see: My admission H+P from Sept 2022 Dr. Bridgett Larsson admission H+P from just last month. [facility] DC summary from Sept 2022 Will use toradol PRN for pain I have had long discussion with patient, but explained to her that multiple docs prior to me came to this decision including specialists just last admit (2-3 weeks ago) I even showed her in Epic where Dr. Bryan Lemma Physiological scientist) initial consult note during last admission on 8/25 clearly states "No role for Dilaudid" Other notes include Dr. Lyndel Safe (Gastroenterologist) 8/28 note where he notes "Seen by Dr. Collene Mares yesterday who discussed that narcotics will delay gastric emptying" (Dr. Collene Mares also a Gastroenterologist).  Sinus tachycardia S.Tach and hypertensive since presentation to ED. Unfortunately this has been persistent and unchanged since presentation to ED despite interventions tried including: 1L IVF bolus 1.5mg  IV dilaudid in ED 5mg  IV metoprolol Looks like she had S tach throughout most of prior admit too Plan Tele monitor Trying 2nd 1L bolus to see if any improvement in HR  GERD without esophagitis Switching protonix to IV for the moment due to intractable N/V.  Type 1 diabetes mellitus (HCC) No anion gap on todays BMP Cont home levemir Sensitive SSI Q4H while NPO  Hypertension associated with diabetes ([place]) Resume home coreg when able to take POs. Giving 5mg  IV metoprolol now for tachycardia + hypertension (probably rebound from not being able to keep coreg down). Re-order metoprolol vs labetalol if still unable to take coreg by AM.      Advance Care Planning:   Code Status: Full Code  Consults: Message sent to [facility]  Family Communication: No family in room  Severity of Illness: The appropriate patient status for this  patient is OBSERVATION. Observation status is judged to be reasonable and necessary in order to provide the required intensity of service to ensure the patient's safety. The patient's presenting symptoms, physical exam findings, and initial radiographic and laboratory data in the context of their medical condition is felt to place them at decreased risk for further clinical deterioration. Furthermore, it is anticipated that the patient will be medically stable for discharge from the hospital within 2 midnights of admission.   Author: Etta Quill., DO 11/05/2021 12:19 AM  For on call review [email or web address].

## 2021-11-05 NOTE — Progress Notes (Addendum)
Pt now reporting SOB.  Denies any chest pain. Lung sounds clear, satting 100% on RA, but persistently tachycardic and hypertensive: 160/100 and HR 130.  RN gave atarax for anxiety.  Im ordering stat: 1) d.dimer - CTA chest if positive 2) troponin 3) CXR 4) EKG 5) since its already 2am, will just have them draw her AM labs (CBC/CMP) stat as well.

## 2021-11-05 NOTE — Progress Notes (Signed)
PROGRESS NOTE   Kelli Hudson  [id number] DOB: [birth year] DOA: 11/04/2021 PCP: [name], MD  Brief Narrative:  31 year old black female DM TY 1 since 2008 Insulin pump in the past, known gastroparesis HTN, HLD, underlying chronic sinus tach prior left foot ulcer followed chronically by Dr. Havery Moros in the outpatient setting Has been [facility] but has been declined at all centers Last EGD 08/27/2021 normal esophagus?  Pyloric spasm status post Botox Last admitted 8/23-8/28-received Reglan Dilaudid Robaxin and was discharged on Dilaudid Reglan Compazine pantoprazole ---had been seen in OP by Dr. Havery Moros recently to see if can get gastric pacemaker-set to see Dr. Donnamae [facility] in [place] for gastroparesis  Re-presented 9/18 nausea vomiting chest generalized abdominal pain White count 8 BUN/creatinine 18/1.22 LFTs normal lipase normal troponin negative C chestT No PE  Stating 10/10 abdominal pain  Hospital-Problem based course  Intractable nausea vomiting See below discussion in the subjective Defer to GI planning-I would suggest holding off on IR venting gastrostomy at this time and empiric management in the outpatient setting once she stabilizes Continue LR 125 cc/H, Protonix 40 IV twice daily--increased Reglan to 20 every 6 ABSOLUTELY NO OPIATES Toradol 30 every 6 as needed Tylenol increased to thousand every 6 as needed added Flector patch twice daily  Concern for malingering and secondary gain-see below discussion  Chronic pain syndrome Unclear if other stressors at home-see below  Chronic sinus tach- increase Coreg to 12.5 twice daily meals Can keep on telemetry overnight but discontinue in a.m. as this is chronic  History of prolonged QTc I examined the telemetry strips myself and the QTc is about 0.26 but she is tachycardic I do think that we can go up on her Reglan to 20 every 6  DM TY 1 with  neuropathy [id number]--she is only on clears Increase Levemir 16 units to 22 units continue every 4 coverage Continue gabapentin 300 3 times daily for neuropathy  DVT prophylaxis: SCD Code Status: Full Family Communication: None Disposition:  Status is: Observation The patient will require care spanning > 2 midnights and should be moved to inpatient because:   Needs to tolerate p.o. before discussion of discharge-GI may elect to consider venting gastrostomy   Consultants:  GI IR  Procedures:   Antimicrobials:     Subjective: Patient was noted to be putting her fingers down her throat-this was witnessed by RN who was passing by the room-when I asked her about this she said that her throat was "itchy" I informed her that if this was noticed again she will be discharged home as this would count as malingering  She asks for Dilaudid by name and continues to ask for it-I have politely declined and explained to her that this would not be a good medicine to give her medication that directly worsens her symptoms  Objective: Vitals:   11/05/21 0008 11/05/21 0135 11/05/21 0458 11/05/21 0607  BP:  (!) 160/104 (!) 190/121 (!) 166/118  Pulse:  (!) 118 (!) 128 (!) 121  Resp:  14 20   Temp:  98.7 F (37.1 C) 98.5 F (36.9 C)   TempSrc:   Oral   SpO2:  100% 100% 100%  Weight: 79.2 kg     Height: 5\' 9"  (1.753 m)       Intake/Output Summary (Last 24 hours) at 11/05/2021 1252 Last data filed at 11/05/2021 0900 Gross per 24 hour  Intake 1966.67 ml  Output --  Net 1966.67 ml  Filed Weights   11/05/21 0008  Weight: 79.2 kg    Examination:  EOMI NCAT no focal deficit no icterus no pallor S1-S2 tachycardic seems to be sinus tach Chest clear no added sound rales rhonchi or wheeze Abdomen is soft no rebound no guarding No lower extremity edema Power is 5/5  Data Reviewed: personally reviewed   CBC    Component Value Date/Time   WBC 9.6 11/05/2021 0310   RBC 4.05 11/05/2021  0310   HGB 12.0 11/05/2021 0310   HGB 10.1 (L) 02/04/2021 1539   HCT 36.8 11/05/2021 0310   HCT 30.0 (L) 02/04/2021 1539   PLT 446 (H) 11/05/2021 0310   PLT 386 02/04/2021 1539   MCV 90.9 11/05/2021 0310   MCV 87 02/04/2021 1539   MCH 29.6 11/05/2021 0310   MCHC 32.6 11/05/2021 0310   RDW 13.6 11/05/2021 0310   RDW 12.9 02/04/2021 1539   LYMPHSABS 1.8 11/04/2021 1745   LYMPHSABS 2.9 02/04/2021 1539   MONOABS 0.4 11/04/2021 1745   EOSABS 0.0 11/04/2021 1745   EOSABS 0.1 02/04/2021 1539   BASOSABS 0.1 11/04/2021 1745   BASOSABS 0.0 02/04/2021 1539      Latest Ref Rng & Units 11/05/2021    3:10 AM 11/04/2021    6:08 PM 11/04/2021    5:45 PM  CMP  Glucose 70 - 99 mg/dL 283  330  323   BUN 6 - 20 mg/dL 17  15  18    Creatinine 0.44 - 1.00 mg/dL 1.03  1.10  1.22   Sodium 135 - 145 mmol/L 138  136  138   Potassium 3.5 - 5.1 mmol/L 3.9  3.9  3.8   Chloride 98 - 111 mmol/L 105  105  106   CO2 22 - 32 mmol/L 21   19   Calcium 8.9 - 10.3 mg/dL 10.0   10.1   Total Protein 6.5 - 8.1 g/dL 7.2   7.8   Total Bilirubin 0.3 - 1.2 mg/dL 1.1   1.4   Alkaline Phos 38 - 126 U/L 76   81   AST 15 - 41 U/L 20   24   ALT 0 - 44 U/L 17   19      Radiology Studies: CT Angio Chest Pulmonary Embolism (PE) W or WO Contrast  Result Date: 11/05/2021 CLINICAL DATA:  Pulmonary embolism suspected. Positive D-dimer. 31 year old female. EXAM: CT ANGIOGRAPHY CHEST WITH CONTRAST TECHNIQUE: Multidetector CT imaging of the chest was performed using the standard protocol during bolus administration of intravenous contrast. Multiplanar CT image reconstructions and MIPs were obtained to evaluate the vascular anatomy. RADIATION DOSE REDUCTION: This exam was performed according to the departmental dose-optimization program which includes automated exposure control, adjustment of the mA and/or kV according to patient size and/or use of iterative reconstruction technique. CONTRAST:  34mL OMNIPAQUE IOHEXOL 350 MG/ML SOLN  COMPARISON:  Portable chest today, PA Lat chest 11/04/2021, CTA chest 11/16/2020, CTA chest 08/22/2016. FINDINGS: Cardiovascular: The cardiac size is normal. There is no pericardial effusion. The pulmonary arteries and veins are normal caliber. No arterial embolus is seen, although please note the subsegmental arterial bed in the lower lung fields is obscured due to breathing motion. The aorta and great vessels are normal. There is no visible coronary artery calcification. Mediastinum/Nodes: No enlarged mediastinal, hilar, or axillary lymph nodes. Thyroid gland, trachea, and esophagus demonstrate no significant findings. There is a small hiatal hernia. 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 Mild chronic elevation right hemidiaphragm. Lungs/Pleura:  Lungs are clear. No pleural effusion or pneumothorax. Upper Abdomen: No acute abnormality. Musculoskeletal: No chest wall abnormality. No acute or significant osseous findings. Review of the MIP images confirms the above findings. IMPRESSION: No acute chest CT or CTA findings, but with nonvisualization of the lower zonal subsegmental arteries due to breathing motion. Electronically Signed   By: Telford Nab M.D.   On: 11/05/2021 05:45   DG CHEST PORT 1 VIEW  Result Date: 11/05/2021 CLINICAL DATA:  Increasing shortness of breath EXAM: PORTABLE CHEST 1 VIEW COMPARISON:  11/04/2021 FINDINGS: The heart size and mediastinal contours are within normal limits. Both lungs are clear. The visualized skeletal structures are unremarkable. IMPRESSION: No active disease. Electronically Signed   By: Inez Catalina M.D.   On: 11/05/2021 02:38   DG Chest 2 View  Result Date: 11/04/2021 CLINICAL DATA:  Shortness of breath, chest pain. EXAM: CHEST - 2 VIEW COMPARISON:  October 09, 2021. FINDINGS: The heart size and mediastinal contours are within normal limits. Both lungs are clear. The visualized skeletal structures are unremarkable. IMPRESSION: No active  cardiopulmonary disease. Electronically Signed   By: Marijo Conception M.D.   On: 11/04/2021 17:47     Scheduled Meds:  atorvastatin  20 mg Oral Daily   carvedilol  12.5 mg Oral BID WC   diclofenac  1 patch Transdermal BID   gabapentin  300 mg Oral TID   insulin aspart  0-9 Units Subcutaneous Q4H   insulin detemir  16 Units Subcutaneous Daily   pantoprazole (PROTONIX) IV  40 mg Intravenous Q12H   Continuous Infusions:  lactated ringers 125 mL/hr at 11/05/21 0532   metoCLOPramide (REGLAN) injection 20 mg (11/05/21 1225)     LOS: 0 days   Time spent: [place], MD [facility] To contact the attending provider between 7A-7P or the covering provider during after hours 7P-7A, please log into the web site [email or web address] and access using universal  password for that web site. If you do not have the password, please call the hospital operator.  11/05/2021, 12:52 PM

## 2021-11-06 ENCOUNTER — Telehealth: Payer: Self-pay | Admitting: Family Medicine

## 2021-11-06 ENCOUNTER — Inpatient Hospital Stay (HOSPITAL_COMMUNITY): Payer: 59

## 2021-11-06 DIAGNOSIS — K219 Gastro-esophageal reflux disease without esophagitis: Secondary | ICD-10-CM | POA: Diagnosis not present

## 2021-11-06 DIAGNOSIS — R1084 Generalized abdominal pain: Secondary | ICD-10-CM

## 2021-11-06 DIAGNOSIS — E1043 Type 1 diabetes mellitus with diabetic autonomic (poly)neuropathy: Secondary | ICD-10-CM | POA: Diagnosis not present

## 2021-11-06 DIAGNOSIS — Z765 Malingerer [conscious simulation]: Secondary | ICD-10-CM | POA: Diagnosis not present

## 2021-11-06 DIAGNOSIS — E1159 Type 2 diabetes mellitus with other circulatory complications: Secondary | ICD-10-CM | POA: Diagnosis not present

## 2021-11-06 DIAGNOSIS — R112 Nausea with vomiting, unspecified: Secondary | ICD-10-CM | POA: Diagnosis not present

## 2021-11-06 DIAGNOSIS — K3184 Gastroparesis: Secondary | ICD-10-CM | POA: Diagnosis not present

## 2021-11-06 LAB — COMPREHENSIVE METABOLIC PANEL
ALT: 13 U/L (ref 0–44)
AST: 23 U/L (ref 15–41)
Albumin: 2.6 g/dL — ABNORMAL LOW (ref 3.5–5.0)
Alkaline Phosphatase: 58 U/L (ref 38–126)
Anion gap: 8 (ref 5–15)
BUN: 22 mg/dL — ABNORMAL HIGH (ref 6–20)
CO2: 25 mmol/L (ref 22–32)
Calcium: 9.3 mg/dL (ref 8.9–10.3)
Chloride: 110 mmol/L (ref 98–111)
Creatinine, Ser: 1.47 mg/dL — ABNORMAL HIGH (ref 0.44–1.00)
GFR, Estimated: 49 mL/min — ABNORMAL LOW (ref >60)
Glucose, Bld: 112 mg/dL — ABNORMAL HIGH (ref 70–99)
Potassium: 3.6 mmol/L (ref 3.5–5.1)
Sodium: 143 mmol/L (ref 135–145)
Total Bilirubin: 0.9 mg/dL (ref 0.3–1.2)
Total Protein: 5.5 g/dL — ABNORMAL LOW (ref 6.5–8.1)

## 2021-11-06 LAB — CBC WITH DIFFERENTIAL/PLATELET
Abs Immature Granulocytes: 0.03 10*3/uL (ref 0.00–0.07)
Abs Immature Granulocytes: 0.11 10*3/uL — ABNORMAL HIGH (ref 0.00–0.07)
Basophils Absolute: 0 10*3/uL (ref 0.0–0.1)
Basophils Absolute: 0 10*3/uL (ref 0.0–0.1)
Basophils Relative: 0 %
Basophils Relative: 0 %
Eosinophils Absolute: 0.1 10*3/uL (ref 0.0–0.5)
Eosinophils Absolute: 0 10*3/uL (ref 0.0–0.5)
Eosinophils Relative: 1 %
Eosinophils Relative: 0 %
HCT: 29.3 % — ABNORMAL LOW (ref 36.0–46.0)
HCT: 29.4 % — ABNORMAL LOW (ref 36.0–46.0)
Hemoglobin: 9.5 g/dL — ABNORMAL LOW (ref 12.0–15.0)
Hemoglobin: 10.2 g/dL — ABNORMAL LOW (ref 12.0–15.0)
Immature Granulocytes: 0 %
Immature Granulocytes: 1 %
Lymphocytes Relative: 30 %
Lymphocytes Relative: 24 %
Lymphs Abs: 4.3 10*3/uL — ABNORMAL HIGH (ref 0.7–4.0)
Lymphs Abs: 2.9 10*3/uL (ref 0.7–4.0)
MCH: 29.2 pg (ref 26.0–34.0)
MCH: 29.9 pg (ref 26.0–34.0)
MCHC: 32.4 g/dL (ref 30.0–36.0)
MCHC: 34.7 g/dL (ref 30.0–36.0)
MCV: 90.2 fL (ref 80.0–100.0)
MCV: 86.2 fL (ref 80.0–100.0)
Monocytes Absolute: 1.1 10*3/uL — ABNORMAL HIGH (ref 0.1–1.0)
Monocytes Absolute: 0.9 10*3/uL (ref 0.1–1.0)
Monocytes Relative: 8 %
Monocytes Relative: 7 %
Neutro Abs: 8.5 10*3/uL — ABNORMAL HIGH (ref 1.7–7.7)
Neutro Abs: 8.1 10*3/uL — ABNORMAL HIGH (ref 1.7–7.7)
Neutrophils Relative %: 61 %
Neutrophils Relative %: 68 %
Platelets: 378 10*3/uL (ref 150–400)
Platelets: 375 10*3/uL (ref 150–400)
RBC: 3.25 MIL/uL — ABNORMAL LOW (ref 3.87–5.11)
RBC: 3.41 MIL/uL — ABNORMAL LOW (ref 3.87–5.11)
RDW: 14.1 % (ref 11.5–15.5)
RDW: 13.7 % (ref 11.5–15.5)
WBC: 14 10*3/uL — ABNORMAL HIGH (ref 4.0–10.5)
WBC: 12 10*3/uL — ABNORMAL HIGH (ref 4.0–10.5)
nRBC: 0 % (ref 0.0–0.2)
nRBC: 0 % (ref 0.0–0.2)

## 2021-11-06 LAB — GLUCOSE, CAPILLARY
Glucose-Capillary: 105 mg/dL — ABNORMAL HIGH (ref 70–99)
Glucose-Capillary: 113 mg/dL — ABNORMAL HIGH (ref 70–99)
Glucose-Capillary: 126 mg/dL — ABNORMAL HIGH (ref 70–99)
Glucose-Capillary: 154 mg/dL — ABNORMAL HIGH (ref 70–99)
Glucose-Capillary: 182 mg/dL — ABNORMAL HIGH (ref 70–99)
Glucose-Capillary: 205 mg/dL — ABNORMAL HIGH (ref 70–99)

## 2021-11-06 LAB — MAGNESIUM: Magnesium: 1.5 mg/dL — ABNORMAL LOW (ref 1.7–2.4)

## 2021-11-06 LAB — BASIC METABOLIC PANEL
Anion gap: 8 (ref 5–15)
BUN: 20 mg/dL (ref 6–20)
CO2: 22 mmol/L (ref 22–32)
Calcium: 9.1 mg/dL (ref 8.9–10.3)
Chloride: 110 mmol/L (ref 98–111)
Creatinine, Ser: 1.32 mg/dL — ABNORMAL HIGH (ref 0.44–1.00)
GFR, Estimated: 56 mL/min — ABNORMAL LOW (ref >60)
Glucose, Bld: 131 mg/dL — ABNORMAL HIGH (ref 70–99)
Potassium: 4.7 mmol/L (ref 3.5–5.1)
Sodium: 140 mmol/L (ref 135–145)

## 2021-11-06 MED ORDER — HYDRALAZINE HCL 20 MG/ML IJ SOLN
5.0000 mg | Freq: Four times a day (QID) | INTRAMUSCULAR | Status: DC | PRN
  Administered 2021-11-06: 10 mg via INTRAVENOUS
  Filled 2021-11-06: qty 1

## 2021-11-06 MED ORDER — BISACODYL 5 MG PO TBEC: 10.0000 mg | DELAYED_RELEASE_TABLET | Freq: Every day | ORAL | Status: DC | PRN

## 2021-11-06 MED ORDER — METOCLOPRAMIDE HCL 5 MG/ML IJ SOLN
10.0000 mg | Freq: Four times a day (QID) | INTRAMUSCULAR | Status: DC
  Administered 2021-11-06 – 2021-11-08 (×7): 10 mg via INTRAVENOUS
  Filled 2021-11-06 (×8): qty 2

## 2021-11-06 MED ORDER — SODIUM CHLORIDE 0.9 % IV SOLN: 6.2500 mg | Freq: Three times a day (TID) | INTRAVENOUS | Status: DC | PRN

## 2021-11-06 MED ORDER — MAGNESIUM SULFATE 2 GM/50ML IV SOLN
2.0000 g | Freq: Once | INTRAVENOUS | Status: AC
  Administered 2021-11-06: 2 g via INTRAVENOUS
  Filled 2021-11-06: qty 50

## 2021-11-06 MED ORDER — GLYCERIN (LAXATIVE) 2 G RE SUPP: 1.0000 | Freq: Every day | RECTAL | Status: DC | PRN

## 2021-11-06 MED ORDER — DICYCLOMINE HCL 10 MG/5ML PO SOLN
10.0000 mg | Freq: Once | ORAL | Status: AC
  Administered 2021-11-06: 10 mg via ORAL
  Filled 2021-11-06: qty 5

## 2021-11-06 MED ORDER — ALUM & MAG HYDROXIDE-SIMETH 200-200-20 MG/5ML PO SUSP
30.0000 mL | Freq: Once | ORAL | Status: AC
  Administered 2021-11-06: 30 mL via ORAL
  Filled 2021-11-06: qty 30

## 2021-11-06 NOTE — Progress Notes (Signed)
PROGRESS NOTE    Kelli Hudson  NKN:[id number] DOB: 07-Jun-1990 DOA: 11/04/2021 PCP: Kelli Mast, MD    Chief Complaint  Patient presents with   Abdominal Pain    Brief Narrative:  31 year old black female DM TY 1 since 2008 Insulin pump in the past, known gastroparesis HTN, HLD, underlying chronic sinus tach prior left foot ulcer followed chronically by Dr. Adela Hudson in the outpatient setting Has been [facility] but has been declined at all centers Last EGD 08/27/2021 normal esophagus?  Pyloric spasm status post Botox Last admitted 8/23-8/28-received Reglan Dilaudid Robaxin and was discharged on Dilaudid Reglan Compazine pantoprazole ---had been seen in OP by Dr. Adela Hudson recently to see if can get gastric pacemaker-set to see Dr. Jiles [facility] in [place] for gastroparesis   Re-presented 9/18 nausea vomiting chest generalized abdominal pain White count 8 BUN/creatinine 18/1.22 LFTs normal lipase normal troponin negative C chestT No PE    Assessment & Plan:   Principal Problem:   Diabetic gastroparesis associated with type 1 diabetes mellitus (HCC) Active Problems:   Intractable nausea and vomiting   Sinus tachycardia   Drug-seeking behavior   Hypertension associated with diabetes (HCC)   Type 1 diabetes mellitus (HCC)   GERD without esophagitis   Gastroparesis  Intractable nausea and vomiting:  GI on board, not well controlled due to severe gastroparesis.  GI recommends venting gastrostomy at this time. General surgery consulted for possible gastrostomy tube and jejunostomy feeding tube.  IR on board, recommends gastric pacemaker, rather a G tube or J tube.  Continue with IV toradol and IV Reglan every 6 hours  and IV fluids and IV protonix BID.     Chronic pain syndrome:  Unfortunately, pt has narcotic pain meds seeking behavior. No opiates at this time.     Chronic sinus tachycardia:  Monitor. EKG shows sinus  tachycardia.    Type 1 DM,  Insulin dependent. Uncontrolled cbg's CBG (last 3)  Recent Labs    11/06/21 0504 11/06/21 0734 11/06/21 1208  GLUCAP 113* 105* 205*   Continue with Levemir and SSI.  Continue with gabapentin for diabetic neuropathy.    Hypertensive Crisis:  Continue with coreg and prn hydralazine.    Hyperlipidemia:  Resume lipitor.    Hypomagnesemia:  Replaced.    AKI, Leukocytosis, and hemoglobin drop to 9.5.  Suspect dilutional sample.  Recheck labs tonight.       DVT prophylaxis: SCD'S Code Status: Full code.  Family Communication: none at bedside.  Disposition:   Status is: Inpatient Remains inpatient appropriate because: IV reglan use.    Level of care: Telemetry Consultants:  Gastroenterology Surgery  IR.   Procedures: none.   Antimicrobials: none.    Subjective: PT is requesting for dilaudid specifically.   Objective: Vitals:   11/05/21 2024 11/06/21 0032 11/06/21 0507 11/06/21 1326  BP: (!) 140/107 (!) 94/48 135/83 (!) 194/127  Pulse: (!) 117 98 93 (!) 121  Resp: 18 16 18 18   Temp: 99.5 F (37.5 C) 98.5 F (36.9 C) 98.3 F (36.8 C) 99.2 F (37.3 C)  TempSrc: Oral Oral Oral Oral  SpO2: 100% 98% 99% 100%  Weight:      Height:        Intake/Output Summary (Last 24 hours) at 11/06/2021 1504 Last data filed at 11/06/2021 0126 Gross per 24 hour  Intake 1223.93 ml  Output --  Net 1223.93 ml   Filed Weights   11/05/21 0008  Weight: 79.2 kg  Examination:  General exam: Appears calm and comfortable  Respiratory system: Clear to auscultation. Respiratory effort normal. Cardiovascular system: S1 & S2 heard, RRR. No JVD,No pedal edema. Gastrointestinal system: Abdomen is soft, mild tenderness on exam.  Normal bowel sounds heard. Central nervous system: Alert and oriented. No focal neurological deficits. Extremities: Symmetric 5 x 5 power. Skin: No rashes, lesions or ulcers Psychiatry: Mood & affect appropriate.      Data Reviewed: I have personally reviewed following labs and imaging studies  CBC: Recent Labs  Lab 11/04/21 1745 11/04/21 1808 11/05/21 0310 11/06/21 0415  WBC 8.6  --  9.6 14.0*  NEUTROABS 6.3  --   --  8.5*  HGB 11.4* 12.2 12.0 9.5*  HCT 35.2* 36.0 36.8 29.3*  MCV 90.0  --  90.9 90.2  PLT 476*  --  446* 176    Basic Metabolic Panel: Recent Labs  Lab 11/04/21 1745 11/04/21 1808 11/05/21 0310 11/05/21 0815 11/06/21 0415  NA 138 136 138  --  143  K 3.8 3.9 3.9  --  3.6  CL 106 105 105  --  110  CO2 19*  --  21*  --  25  GLUCOSE 323* 330* 283*  --  112*  BUN 18 15 17   --  22*  CREATININE 1.22* 1.10* 1.03*  --  1.47*  CALCIUM 10.1  --  10.0  --  9.3  MG 1.8  --   --  1.8 1.5*    GFR: Estimated Creatinine Clearance: 58.5 mL/min (A) (by C-G formula based on SCr of 1.47 mg/dL (H)).  Liver Function Tests: Recent Labs  Lab 11/04/21 1745 11/05/21 0310 11/06/21 0415  AST 24 20 23   ALT 19 17 13   ALKPHOS 81 76 58  BILITOT 1.4* 1.1 0.9  PROT 7.8 7.2 5.5*  ALBUMIN 3.7 3.4* 2.6*    CBG: Recent Labs  Lab 11/05/21 2021 11/06/21 0018 11/06/21 0504 11/06/21 0734 11/06/21 1208  GLUCAP 222* 154* 113* 105* 205*     No results found for this or any previous visit (from the past 240 hour(s)).       Radiology Studies: CT Angio Chest Pulmonary Embolism (PE) W or WO Contrast  Result Date: 11/05/2021 CLINICAL DATA:  Pulmonary embolism suspected. Positive D-dimer. 31 year old female. EXAM: CT ANGIOGRAPHY CHEST WITH CONTRAST TECHNIQUE: Multidetector CT imaging of the chest was performed using the standard protocol during bolus administration of intravenous contrast. Multiplanar CT image reconstructions and MIPs were obtained to evaluate the vascular anatomy. RADIATION DOSE REDUCTION: This exam was performed according to the departmental dose-optimization program which includes automated exposure control, adjustment of the mA and/or kV according to patient size  and/or use of iterative reconstruction technique. CONTRAST:  41mL OMNIPAQUE IOHEXOL 350 MG/ML SOLN COMPARISON:  Portable chest today, PA Lat chest 11/04/2021, CTA chest 11/16/2020, CTA chest 08/22/2016. FINDINGS: Cardiovascular: The cardiac size is normal. There is no pericardial effusion. The pulmonary arteries and veins are normal caliber. No arterial embolus is seen, although please note the subsegmental arterial bed in the lower lung fields is obscured due to breathing motion. The aorta and great vessels are normal. There is no visible coronary artery calcification. Mediastinum/Nodes: No enlarged mediastinal, hilar, or axillary lymph nodes. Thyroid gland, trachea, and esophagus demonstrate no significant findings. There is a small hiatal hernia. 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 Mild chronic elevation right hemidiaphragm. Lungs/Pleura: Lungs are clear. No pleural effusion or pneumothorax. Upper Abdomen: No acute abnormality. Musculoskeletal: No chest wall abnormality. No  acute or significant osseous findings. Review of the MIP images confirms the above findings. IMPRESSION: No acute chest CT or CTA findings, but with nonvisualization of the lower zonal subsegmental arteries due to breathing motion. Electronically Signed   By: Almira Bar M.D.   On: 11/05/2021 05:45   DG CHEST PORT 1 VIEW  Result Date: 11/05/2021 CLINICAL DATA:  Increasing shortness of breath EXAM: PORTABLE CHEST 1 VIEW COMPARISON:  11/04/2021 FINDINGS: The heart size and mediastinal contours are within normal limits. Both lungs are clear. The visualized skeletal structures are unremarkable. IMPRESSION: No active disease. Electronically Signed   By: Alcide Clever M.D.   On: 11/05/2021 02:38   DG Chest 2 View  Result Date: 11/04/2021 CLINICAL DATA:  Shortness of breath, chest pain. EXAM: CHEST - 2 VIEW COMPARISON:  October 09, 2021. FINDINGS: The heart size and mediastinal contours are within normal limits. Both lungs  are clear. The visualized skeletal structures are unremarkable. IMPRESSION: No active cardiopulmonary disease. Electronically Signed   By: Lupita Raider M.D.   On: 11/04/2021 17:47        Scheduled Meds:  atorvastatin  20 mg Oral Daily   carvedilol  12.5 mg Oral BID WC   diclofenac  1 patch Transdermal BID   gabapentin  300 mg Oral TID   insulin aspart  0-9 Units Subcutaneous Q4H   insulin detemir  16 Units Subcutaneous Daily   pantoprazole (PROTONIX) IV  40 mg Intravenous Q12H   Continuous Infusions:  lactated ringers 125 mL/hr at 11/06/21 0609   metoCLOPramide (REGLAN) injection 20 mg (11/06/21 1216)     LOS: 1 day    Time spent: 38 minutes.     Kathlen Mody, MD [facility]   To contact the attending provider between 7A-7P or the covering provider during after hours 7P-7A, please log into the web site [email or web address] and access using universal [place] password for that web site. If you do not have the password, please call the hospital operator.  11/06/2021, 3:04 PM

## 2021-11-06 NOTE — Consult Note (Addendum)
Laurie Panda Jul 10, 1990  [id number].    Requesting MD: Dr. Karleen Hampshire Chief Complaint/Reason for Consult: Gastroparesis  HPI: Kelli Hudson is a 31 y.o. female with a history of type 1 diabetes and chronic intractable nausea and vomiting secondary to severe gastroparesis who we are asked to see.  Patient is followed by [facility], Dr. Havery Moros, for this.  She is currently on scheduled Reglan 10 mg IV every 6 hours. Has had issues with prolonged QTc previously that has limited her ability to get antinausea medications.  Recently referred to gastroparesis specialist Dr. Vickki Muff at [facility] in [place] for gastric pacemaker placement, appointment has not yet been scheduled. Her last endoscopy was 7/10 that showed stomach was visibly normal with diminished motility seen and patent pylorus. This is now her 14th admission this year for gastroparesis exacerbations  per [facility] notes.  She reports she has been doing okay with liquids at home but has intolerance to anything solid.  She was able to tolerate 3-4 cups of chicken broth this morning without worsening nausea or vomiting.  Passing flatus.  Last BM 4 days ago.  Notes chronic abdominal pain that is generalized, worst in the LUQ and unchanged from baseline. Denies fevers. No imaging attend during this admission. WBC 14. We were asked to see for possible GJ placement.  She denies any prior abdominal surgeries.  She is not on blood thinners.  Denies alcohol, tobacco or illicit drug use.  She is not currently working.  Lives at home with her boyfriend.  ROS: ROS As above, see hpi  Family History  Problem Relation Age of Onset   Lung cancer Mother    Diabetes Mother    Bipolar disorder Father    Liver cancer Maternal Grandfather    Diabetes Maternal Aunt        x 2   Pancreatic cancer Maternal Uncle    Prostate cancer Maternal Uncle    Breast cancer Other        maternal great aunt   Heart disease Other     Ovarian cancer Other        maternal great aunt   Kidney disease Other        maternal great aunt   Colon cancer Neg Hx    Stomach cancer Neg Hx     Past Medical History:  Diagnosis Date   Acute H. pylori gastric ulcer    Coffee ground emesis    Diabetes mellitus ([place])    Diabetic gastroparesis ([place])    DKA (diabetic ketoacidosis) ([place]) 02/24/2021   Gastroparesis    GERD (gastroesophageal reflux disease)    Hypertension    Hyperthyroidism    Intractable nausea and vomiting 04/20/2021   Normocytic anemia 04/20/2020   Prolonged Q-T interval on ECG     Past Surgical History:  Procedure Laterality Date   AMPUTATION TOE Left 03/10/2018   Procedure: AMPUTATION FIFTH TOE;  Surgeon: Trula Slade, DPM;  Location: [place];  Service: Podiatry;  Laterality: Left;   BIOPSY  01/28/2020   Procedure: BIOPSY;  Surgeon: Otis Brace, MD;  Location: [facility];  Service: Gastroenterology;;   BOTOX INJECTION  08/26/2021   Procedure: BOTOX INJECTION;  Surgeon: Doran Stabler, MD;  Location: [facility];  Service: Gastroenterology;;   ESOPHAGOGASTRODUODENOSCOPY N/A 01/28/2020   Procedure: ESOPHAGOGASTRODUODENOSCOPY (EGD);  Surgeon: Otis Brace, MD;  Location: [facility];  Service: Gastroenterology;  Laterality: N/A;   ESOPHAGOGASTRODUODENOSCOPY (EGD) WITH PROPOFOL Left 09/08/2015  Procedure: ESOPHAGOGASTRODUODENOSCOPY (EGD) WITH PROPOFOL;  Surgeon: Arta Silence, MD;  Location: [facility];  Service: Endoscopy;  Laterality: Left;   ESOPHAGOGASTRODUODENOSCOPY (EGD) WITH PROPOFOL N/A 08/26/2021   Procedure: ESOPHAGOGASTRODUODENOSCOPY (EGD) WITH PROPOFOL;  Surgeon: Doran Stabler, MD;  Location: [facility];  Service: Gastroenterology;  Laterality: N/A;   WISDOM TOOTH EXTRACTION      Social History:  reports that she has never smoked. She has never used smokeless tobacco. She reports current alcohol use of about 1.0 standard drink of alcohol per  week. She reports that she does not use drugs.  Allergies: No Known Allergies  Medications Prior to Admission  Medication Sig Dispense Refill   atorvastatin (LIPITOR) 20 MG tablet Take 1 tablet (20 mg total) by mouth daily. 90 tablet 2   carvedilol (COREG) 6.25 MG tablet Take 1 tablet (6.25 mg total) by mouth 2 (two) times daily with a meal. 30 tablet 5   dicyclomine (BENTYL) 20 MG tablet Take 20 mg by mouth 3 (three) times daily as needed for spasms.     erythromycin base (E-MYCIN) 500 MG tablet Take 500 mg by mouth 3 (three) times daily.     gabapentin (NEURONTIN) 300 MG capsule Take 300 mg by mouth 3 (three) times daily.     hydrOXYzine (VISTARIL) 25 MG capsule Take 1 capsule (25 mg total) by mouth every 8 (eight) hours as needed. (Patient taking differently: Take 25 mg by mouth every 8 (eight) hours as needed for anxiety.) 30 capsule 0   insulin detemir (LEVEMIR FLEXTOUCH) 100 UNIT/ML FlexPen Inject 16 Units into the skin daily. 10 mL 0   insulin regular (NOVOLIN R) 100 units/mL injection Inject 0-0.15 mLs (0-15 Units total) into the skin See admin instructions. Via pump.Start this medication after you follow up with your PCP (Patient taking differently: Inject 0-15 Units into the skin See admin instructions. Using sliding scale) 30 mL 1   metoCLOPramide (REGLAN) 10 MG tablet Take 10 mg by mouth every 8 (eight) hours as needed for nausea or vomiting.     promethazine (PHENERGAN) 12.5 MG tablet Take 12.5 mg by mouth every 6 (six) hours as needed for nausea or vomiting.     Continuous Blood Gluc Transmit (DEXCOM G6 TRANSMITTER) MISC Change every 90 days 1 each 3   pantoprazole (PROTONIX) 40 MG tablet Take 1 tablet (40 mg total) by mouth 2 (two) times daily before a meal. (Patient not taking: Reported on 11/04/2021) 60 tablet 5   prochlorperazine (COMPAZINE) 10 MG tablet Take 1/2-1 tablet (5-10 mg total) by mouth every 8 (eight) hours as needed for nausea or vomiting. 30 tablet 0     Physical  Exam: Blood pressure 135/83, pulse 93, temperature 98.3 F (36.8 C), temperature source Oral, resp. rate 18, height 5\' 9"  (1.753 m), weight 79.2 kg, SpO2 99 %. General: pleasant, WD/WN female who is laying in bed in NAD HEENT: head is normocephalic, atraumatic.  Sclera are noninjected.  PERRL.  Ears and nose without any masses or lesions.  Mouth is pink and moist. Dentition fair Heart: regular, rate, and rhythm Lungs: CTAB, no wheezes, rhonchi, or rales noted.  Respiratory effort nonlabored Abd: Soft, ND, generalized ttp greatest in the LUQ without other areas of point tenderness. No rigidity or guarding. +BS. No masses, hernias, or organomegaly MS: no BUE/BLE edema Skin: warm and dry with no masses, lesions, or rashes Psych: A&Ox4 with an appropriate affect Neuro: cranial nerves grossly intact, normal speech, thought process intact, moves all extremities, gait  not assessed   Results for orders placed or performed during the hospital encounter of 11/04/21 (from the past 48 hour(s))  Urinalysis, Routine w reflex microscopic     Status: Abnormal   Collection Time: 11/04/21  4:47 PM  Result Value Ref Range   Color, Urine YELLOW YELLOW   APPearance HAZY (A) CLEAR   Specific Gravity, Urine 1.016 1.005 - 1.030   pH 5.0 5.0 - 8.0   Glucose, UA >=500 (A) NEGATIVE mg/dL   Hgb urine dipstick SMALL (A) NEGATIVE   Bilirubin Urine NEGATIVE NEGATIVE   Ketones, ur 20 (A) NEGATIVE mg/dL   Protein, ur >=300 (A) NEGATIVE mg/dL   Nitrite NEGATIVE NEGATIVE   Leukocytes,Ua NEGATIVE NEGATIVE   RBC / HPF 6-10 0 - 5 RBC/hpf   WBC, UA 0-5 0 - 5 WBC/hpf   Bacteria, UA RARE (A) NONE SEEN   Squamous Epithelial / LPF 0-5 0 - 5   Mucus PRESENT    Hyaline Casts, UA PRESENT     Comment: Performed at [facility], [place] [address] [place], [place] [address]  POC CBG, ED     Status: Abnormal   Collection Time: 11/04/21  5:01 PM  Result Value Ref Range   Glucose-Capillary 282 (H) 70 - 99 mg/dL     Comment: Glucose reference range applies only to samples taken after fasting for at least 8 hours.  CBC with Differential     Status: Abnormal   Collection Time: 11/04/21  5:45 PM  Result Value Ref Range   WBC 8.6 4.0 - 10.5 K/uL   RBC 3.91 3.87 - 5.11 MIL/uL   Hemoglobin 11.4 (L) 12.0 - 15.0 g/dL   HCT 35.2 (L) 36.0 - 46.0 %   MCV 90.0 80.0 - 100.0 fL   MCH 29.2 26.0 - 34.0 pg   MCHC 32.4 30.0 - 36.0 g/dL   RDW 13.4 11.5 - 15.5 %   Platelets 476 (H) 150 - 400 K/uL   nRBC 0.0 0.0 - 0.2 %   Neutrophils Relative % 72 %   Neutro Abs 6.3 1.7 - 7.7 K/uL   Lymphocytes Relative 21 %   Lymphs Abs 1.8 0.7 - 4.0 K/uL   Monocytes Relative 5 %   Monocytes Absolute 0.4 0.1 - 1.0 K/uL   Eosinophils Relative 1 %   Eosinophils Absolute 0.0 0.0 - 0.5 K/uL   Basophils Relative 1 %   Basophils Absolute 0.1 0.0 - 0.1 K/uL   Immature Granulocytes 0 %   Abs Immature Granulocytes 0.01 0.00 - 0.07 K/uL    Comment: Performed at [facility], [place] [address], [place], [place] [address]  Comprehensive metabolic panel     Status: Abnormal   Collection Time: 11/04/21  5:45 PM  Result Value Ref Range   Sodium 138 135 - 145 mmol/L   Potassium 3.8 3.5 - 5.1 mmol/L   Chloride 106 98 - 111 mmol/L   CO2 19 (L) 22 - 32 mmol/L   Glucose, Bld 323 (H) 70 - 99 mg/dL    Comment: Glucose reference range applies only to samples taken after fasting for at least 8 hours.   BUN 18 6 - 20 mg/dL   Creatinine, Ser 1.22 (H) 0.44 - 1.00 mg/dL   Calcium 10.1 8.9 - 10.3 mg/dL   Total Protein 7.8 6.5 - 8.1 g/dL   Albumin 3.7 3.5 - 5.0 g/dL   AST 24 15 - 41 U/L   ALT 19 0 - 44 U/L  Alkaline Phosphatase 81 38 - 126 U/L   Total Bilirubin 1.4 (H) 0.3 - 1.2 mg/dL   GFR, Estimated >60 >60 mL/min    Comment: (NOTE) Calculated using the CKD-EPI Creatinine Equation (2021)    Anion gap 13 5 - 15    Comment: Performed at [facility], [place] [address], [place], [place] [address]  Lipase,  blood     Status: None   Collection Time: 11/04/21  5:45 PM  Result Value Ref Range   Lipase 26 11 - 51 U/L    Comment: Performed at [facility], [place] [address], [place], [place] [address]  Magnesium     Status: None   Collection Time: 11/04/21  5:45 PM  Result Value Ref Range   Magnesium 1.8 1.7 - 2.4 mg/dL    Comment: Performed at [facility], [place] [address], [place], [place] [address]  I-Stat beta hCG blood, ED     Status: None   Collection Time: 11/04/21  5:56 PM  Result Value Ref Range   I-stat hCG, quantitative <5.0 <5 mIU/mL   Comment 3            Comment:   GEST. AGE      CONC.  (mIU/mL)   <=1 WEEK        5 - 50     2 WEEKS       50 - 500     3 WEEKS       100 - 10,000     4 WEEKS     1,000 - 30,000        FEMALE AND NON-PREGNANT FEMALE:     LESS THAN 5 mIU/mL   I-stat chem 8, ED     Status: Abnormal   Collection Time: 11/04/21  6:08 PM  Result Value Ref Range   Sodium 136 135 - 145 mmol/L   Potassium 3.9 3.5 - 5.1 mmol/L   Chloride 105 98 - 111 mmol/L   BUN 15 6 - 20 mg/dL   Creatinine, Ser 1.10 (H) 0.44 - 1.00 mg/dL   Glucose, Bld 330 (H) 70 - 99 mg/dL    Comment: Glucose reference range applies only to samples taken after fasting for at least 8 hours.   Calcium, Ion 1.26 1.15 - 1.40 mmol/L   TCO2 20 (L) 22 - 32 mmol/L   Hemoglobin 12.2 12.0 - 15.0 g/dL   HCT 36.0 36.0 - 46.0 %  CBG monitoring, ED     Status: Abnormal   Collection Time: 11/04/21  8:50 PM  Result Value Ref Range   Glucose-Capillary 320 (H) 70 - 99 mg/dL    Comment: Glucose reference range applies only to samples taken after fasting for at least 8 hours.  Glucose, capillary     Status: Abnormal   Collection Time: 11/05/21 12:19 AM  Result Value Ref Range   Glucose-Capillary 255 (H) 70 - 99 mg/dL    Comment: Glucose reference range applies only to samples taken after fasting for at least 8 hours.  D-dimer, quantitative     Status: Abnormal   Collection  Time: 11/05/21  3:10 AM  Result Value Ref Range   D-Dimer, Quant 1.59 (H) 0.00 - 0.50 ug/mL-FEU    Comment: (NOTE) At the manufacturer cut-off value of 0.5 g/mL FEU, this assay has a negative predictive value of 95-100%.This assay is intended for use in conjunction with a clinical pretest probability (PTP) assessment model to exclude pulmonary embolism (PE) and  deep venous thrombosis (DVT) in outpatients suspected of PE or DVT. Results should be correlated with clinical presentation. Performed at [facility], [place] [address], [place], [place] [address]   Troponin I (High Sensitivity)     Status: None   Collection Time: 11/05/21  3:10 AM  Result Value Ref Range   Troponin I (High Sensitivity) 10 <18 ng/L    Comment: (NOTE) Elevated high sensitivity troponin I (hsTnI) values and significant  changes across serial measurements may suggest ACS but many other  chronic and acute conditions are known to elevate hsTnI results.  Refer to the "Links" section for chest pain algorithms and additional  guidance. Performed at [facility], [place] [address] [place], [place] [address]   CBC     Status: Abnormal   Collection Time: 11/05/21  3:10 AM  Result Value Ref Range   WBC 9.6 4.0 - 10.5 K/uL   RBC 4.05 3.87 - 5.11 MIL/uL   Hemoglobin 12.0 12.0 - 15.0 g/dL   HCT 36.8 36.0 - 46.0 %   MCV 90.9 80.0 - 100.0 fL   MCH 29.6 26.0 - 34.0 pg   MCHC 32.6 30.0 - 36.0 g/dL   RDW 13.6 11.5 - 15.5 %   Platelets 446 (H) 150 - 400 K/uL   nRBC 0.0 0.0 - 0.2 %    Comment: Performed at [facility], [place] [address], [place], [place] [address]  Comprehensive metabolic panel     Status: Abnormal   Collection Time: 11/05/21  3:10 AM  Result Value Ref Range   Sodium 138 135 - 145 mmol/L   Potassium 3.9 3.5 - 5.1 mmol/L   Chloride 105 98 - 111 mmol/L   CO2 21 (L) 22 - 32 mmol/L   Glucose, Bld 283 (H) 70 - 99 mg/dL    Comment: Glucose reference range  applies only to samples taken after fasting for at least 8 hours.   BUN 17 6 - 20 mg/dL   Creatinine, Ser 1.03 (H) 0.44 - 1.00 mg/dL   Calcium 10.0 8.9 - 10.3 mg/dL   Total Protein 7.2 6.5 - 8.1 g/dL   Albumin 3.4 (L) 3.5 - 5.0 g/dL   AST 20 15 - 41 U/L   ALT 17 0 - 44 U/L   Alkaline Phosphatase 76 38 - 126 U/L   Total Bilirubin 1.1 0.3 - 1.2 mg/dL   GFR, Estimated >60 >60 mL/min    Comment: (NOTE) Calculated using the CKD-EPI Creatinine Equation (2021)    Anion gap 12 5 - 15    Comment: Performed at [facility], [place] [address] [place], [place] [address]  Glucose, capillary     Status: Abnormal   Collection Time: 11/05/21  4:07 AM  Result Value Ref Range   Glucose-Capillary 299 (H) 70 - 99 mg/dL    Comment: Glucose reference range applies only to samples taken after fasting for at least 8 hours.  Troponin I (High Sensitivity)     Status: None   Collection Time: 11/05/21  8:15 AM  Result Value Ref Range   Troponin I (High Sensitivity) 13 <18 ng/L    Comment: (NOTE) Elevated high sensitivity troponin I (hsTnI) values and significant  changes across serial measurements may suggest ACS but many other  chronic and acute conditions are known to elevate hsTnI results.  Refer to the "Links" section for chest pain algorithms and additional  guidance. Performed at [facility], [place] [address]  [place], [place] [address]   Magnesium     Status: None   Collection Time: 11/05/21  8:15 AM  Result Value Ref Range   Magnesium 1.8 1.7 - 2.4 mg/dL    Comment: Performed at [facility], [place] [address], [place], [place] [address]  Glucose, capillary     Status: Abnormal   Collection Time: 11/05/21  9:14 AM  Result Value Ref Range   Glucose-Capillary 259 (H) 70 - 99 mg/dL    Comment: Glucose reference range applies only to samples taken after fasting for at least 8 hours.  Glucose, capillary     Status: Abnormal   Collection Time:  11/05/21 11:50 AM  Result Value Ref Range   Glucose-Capillary 261 (H) 70 - 99 mg/dL    Comment: Glucose reference range applies only to samples taken after fasting for at least 8 hours.  Glucose, capillary     Status: Abnormal   Collection Time: 11/05/21  4:57 PM  Result Value Ref Range   Glucose-Capillary 186 (H) 70 - 99 mg/dL    Comment: Glucose reference range applies only to samples taken after fasting for at least 8 hours.  Glucose, capillary     Status: Abnormal   Collection Time: 11/05/21  8:21 PM  Result Value Ref Range   Glucose-Capillary 222 (H) 70 - 99 mg/dL    Comment: Glucose reference range applies only to samples taken after fasting for at least 8 hours.  Glucose, capillary     Status: Abnormal   Collection Time: 11/06/21 12:18 AM  Result Value Ref Range   Glucose-Capillary 154 (H) 70 - 99 mg/dL    Comment: Glucose reference range applies only to samples taken after fasting for at least 8 hours.  Comprehensive metabolic panel     Status: Abnormal   Collection Time: 11/06/21  4:15 AM  Result Value Ref Range   Sodium 143 135 - 145 mmol/L   Potassium 3.6 3.5 - 5.1 mmol/L   Chloride 110 98 - 111 mmol/L   CO2 25 22 - 32 mmol/L   Glucose, Bld 112 (H) 70 - 99 mg/dL    Comment: Glucose reference range applies only to samples taken after fasting for at least 8 hours.   BUN 22 (H) 6 - 20 mg/dL   Creatinine, Ser 1.47 (H) 0.44 - 1.00 mg/dL   Calcium 9.3 8.9 - 10.3 mg/dL   Total Protein 5.5 (L) 6.5 - 8.1 g/dL   Albumin 2.6 (L) 3.5 - 5.0 g/dL   AST 23 15 - 41 U/L   ALT 13 0 - 44 U/L   Alkaline Phosphatase 58 38 - 126 U/L   Total Bilirubin 0.9 0.3 - 1.2 mg/dL   GFR, Estimated 49 (L) >60 mL/min    Comment: (NOTE) Calculated using the CKD-EPI Creatinine Equation (2021)    Anion gap 8 5 - 15    Comment: Performed at [facility], [place] [address] [place], [place] [address]  CBC with Differential/Platelet     Status: Abnormal   Collection Time: 11/06/21  4:15  AM  Result Value Ref Range   WBC 14.0 (H) 4.0 - 10.5 K/uL   RBC 3.25 (L) 3.87 - 5.11 MIL/uL   Hemoglobin 9.5 (L) 12.0 - 15.0 g/dL   HCT 29.3 (L) 36.0 - 46.0 %   MCV 90.2 80.0 - 100.0 fL   MCH 29.2 26.0 - 34.0 pg   MCHC 32.4 30.0 - 36.0 g/dL   RDW 14.1 11.5 - 15.5 %  Platelets 378 150 - 400 K/uL   nRBC 0.0 0.0 - 0.2 %   Neutrophils Relative % 61 %   Neutro Abs 8.5 (H) 1.7 - 7.7 K/uL   Lymphocytes Relative 30 %   Lymphs Abs 4.3 (H) 0.7 - 4.0 K/uL   Monocytes Relative 8 %   Monocytes Absolute 1.1 (H) 0.1 - 1.0 K/uL   Eosinophils Relative 1 %   Eosinophils Absolute 0.1 0.0 - 0.5 K/uL   Basophils Relative 0 %   Basophils Absolute 0.0 0.0 - 0.1 K/uL   Immature Granulocytes 0 %   Abs Immature Granulocytes 0.03 0.00 - 0.07 K/uL    Comment: Performed at [facility], [place] [address], [place], [place] [address]  Magnesium     Status: Abnormal   Collection Time: 11/06/21  4:15 AM  Result Value Ref Range   Magnesium 1.5 (L) 1.7 - 2.4 mg/dL    Comment: Performed at [facility], [place] [address], [place], [place] [address]  Glucose, capillary     Status: Abnormal   Collection Time: 11/06/21  5:04 AM  Result Value Ref Range   Glucose-Capillary 113 (H) 70 - 99 mg/dL    Comment: Glucose reference range applies only to samples taken after fasting for at least 8 hours.  Glucose, capillary     Status: Abnormal   Collection Time: 11/06/21  7:34 AM  Result Value Ref Range   Glucose-Capillary 105 (H) 70 - 99 mg/dL    Comment: Glucose reference range applies only to samples taken after fasting for at least 8 hours.   CT Angio Chest Pulmonary Embolism (PE) W or WO Contrast  Result Date: 11/05/2021 CLINICAL DATA:  Pulmonary embolism suspected. Positive D-dimer. 31 year old female. EXAM: CT ANGIOGRAPHY CHEST WITH CONTRAST TECHNIQUE: Multidetector CT imaging of the chest was performed using the standard protocol during bolus administration of intravenous contrast.  Multiplanar CT image reconstructions and MIPs were obtained to evaluate the vascular anatomy. RADIATION DOSE REDUCTION: This exam was performed according to the departmental dose-optimization program which includes automated exposure control, adjustment of the mA and/or kV according to patient size and/or use of iterative reconstruction technique. CONTRAST:  4mL OMNIPAQUE IOHEXOL 350 MG/ML SOLN COMPARISON:  Portable chest today, PA Lat chest 11/04/2021, CTA chest 11/16/2020, CTA chest 08/22/2016. FINDINGS: Cardiovascular: The cardiac size is normal. There is no pericardial effusion. The pulmonary arteries and veins are normal caliber. No arterial embolus is seen, although please note the subsegmental arterial bed in the lower lung fields is obscured due to breathing motion. The aorta and great vessels are normal. There is no visible coronary artery calcification. Mediastinum/Nodes: No enlarged mediastinal, hilar, or axillary lymph nodes. Thyroid gland, trachea, and esophagus demonstrate no significant findings. There is a small hiatal hernia. 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 Mild chronic elevation right hemidiaphragm. Lungs/Pleura: Lungs are clear. No pleural effusion or pneumothorax. Upper Abdomen: No acute abnormality. Musculoskeletal: No chest wall abnormality. No acute or significant osseous findings. Review of the MIP images confirms the above findings. IMPRESSION: No acute chest CT or CTA findings, but with nonvisualization of the lower zonal subsegmental arteries due to breathing motion. Electronically Signed   By: Telford Nab M.D.   On: 11/05/2021 05:45   DG CHEST PORT 1 VIEW  Result Date: 11/05/2021 CLINICAL DATA:  Increasing shortness of breath EXAM: PORTABLE CHEST 1 VIEW COMPARISON:  11/04/2021 FINDINGS: The heart size and mediastinal contours are within normal limits. Both lungs are clear. The visualized  skeletal structures are unremarkable. IMPRESSION: No active disease.  Electronically Signed   By: Inez Catalina M.D.   On: 11/05/2021 02:38   DG Chest 2 View  Result Date: 11/04/2021 CLINICAL DATA:  Shortness of breath, chest pain. EXAM: CHEST - 2 VIEW COMPARISON:  October 09, 2021. FINDINGS: The heart size and mediastinal contours are within normal limits. Both lungs are clear. The visualized skeletal structures are unremarkable. IMPRESSION: No active cardiopulmonary disease. Electronically Signed   By: Marijo Conception M.D.   On: 11/04/2021 17:47    Anti-infectives (From admission, onward)    None       Assessment/Plan Chronic intractable nausea and vomiting 2/2 DM gastroparesis Sayyora Korver is a 31 y.o. female with a history of type 1 diabetes and chronic intractable nausea and vomiting secondary to severe gastroparesis.  This is her 14th admission this year for gastroparesis exacerbation.  She is is followed by [facility], Dr. Havery Moros, for this.  Currently on scheduled Reglan 10 mg IV every 6 hours. Has had issues with prolonged QTc previously that has limited her ability to get antinausea medications.  Recently referred to gastroparesis specialist Dr. Vickki Muff at [facility] in [place] for gastric pacemaker placement, appointment has not yet been scheduled. Her last endoscopy was 7/10 that showed stomach was visibly normal with diminished motility seen and patent pylorus. We were asked to see for possible GJ placement.  After discussion with my attending, agree that it may be less invasive to have IR reeval.  Unsure if they would be able to place GJ at the same time but still may be worthwhile having them do a venting G to start, allow her to eat with it clamped as clinically non-obstructed (and vent prn) and then at a later date convert to a [place]. I have reached out to IR/GI/TRH to discuss recommendations.   I reviewed nursing notes, Consultant GI/IR notes, hospitalist notes, last 24 h vitals and pain scores, last 48 h intake and  output, last 24 h labs and trends, and last 24 h imaging results.  Jillyn Ledger, [facility] 11/06/2021, 10:29 AM Please see Amion for pager number during day hours 7:00am-4:30pm

## 2021-11-06 NOTE — Telephone Encounter (Signed)
Pt cancelled appt 9/19. Per chart review pt was in the hospital. No charge/did not count as no show.   Pt has 2 prior no shows 02/07/21 and 08/12/21. No letter sent at this time.

## 2021-11-06 NOTE — Progress Notes (Addendum)
[facility] Progress Note  CC:  Gastroparesis, intractable nausea/vomiting  Subjective: She continues to have persistent nausea and vomiting.  She vomited up broth 3 to 4 times this morning.  She has abdominal soreness but no abdominal pain at this time.  No BM today.  No chest pain or shortness of breath.   Objective:  Vital signs in last 24 hours: Temp:  [98.3 F (36.8 C)-99.5 F (37.5 C)] 98.3 F (36.8 C) (09/20 0507) Pulse Rate:  [93-129] 93 (09/20 0507) Resp:  [16-20] 18 (09/20 0507) BP: (94-155)/(48-107) 135/83 (09/20 0507) SpO2:  [98 %-100 %] 99 % (09/20 0507)   General: 31 year old female resting comfortably in bed. Heart: Mildly tachycardic.  No murmur. Pulm: Breath sounds clear throughout. Abdomen: Soft, mild generalized tenderness without rebound or guarding.  Positive bowel sounds to all 4 quadrants. Extremities:  Without edema. Neurologic:  Alert and  oriented x 4. Grossly normal neurologically. Psych:  Alert and cooperative. Normal mood and affect.  Intake/Output from previous day: 09/19 0701 - 09/20 0700 In: 1223.9 [P.O.:120; I.V.:1033.3; IV Piggyback:70.6] Out: -  Intake/Output this shift: No intake/output data recorded.  Lab Results: Recent Labs    11/04/21 1745 11/04/21 1808 11/05/21 0310 11/06/21 0415  WBC 8.6  --  9.6 14.0*  HGB 11.4* 12.2 12.0 9.5*  HCT 35.2* 36.0 36.8 29.3*  PLT 476*  --  446* 378   BMET Recent Labs    11/04/21 1745 11/04/21 1808 11/05/21 0310 11/06/21 0415  NA 138 136 138 143  K 3.8 3.9 3.9 3.6  CL 106 105 105 110  CO2 19*  --  21* 25  GLUCOSE 323* 330* 283* 112*  BUN 18 15 17  22*  CREATININE 1.22* 1.10* 1.03* 1.47*  CALCIUM 10.1  --  10.0 9.3   LFT Recent Labs    11/06/21 0415  PROT 5.5*  ALBUMIN 2.6*  AST 23  ALT 13  ALKPHOS 58  BILITOT 0.9   PT/INR No results for input(s): "LABPROT", "INR" in the last 72 hours. Hepatitis Panel No results for input(s): "HEPBSAG", "HCVAB",  "HEPAIGM", "HEPBIGM" in the last 72 hours.  CT Angio Chest Pulmonary Embolism (PE) W or WO Contrast  Result Date: 11/05/2021 CLINICAL DATA:  Pulmonary embolism suspected. Positive D-dimer. 31 year old female. EXAM: CT ANGIOGRAPHY CHEST WITH CONTRAST TECHNIQUE: Multidetector CT imaging of the chest was performed using the standard protocol during bolus administration of intravenous contrast. Multiplanar CT image reconstructions and MIPs were obtained to evaluate the vascular anatomy. RADIATION DOSE REDUCTION: This exam was performed according to the departmental dose-optimization program which includes automated exposure control, adjustment of the mA and/or kV according to patient size and/or use of iterative reconstruction technique. CONTRAST:  34mL OMNIPAQUE IOHEXOL 350 MG/ML SOLN COMPARISON:  Portable chest today, PA Lat chest 11/04/2021, CTA chest 11/16/2020, CTA chest 08/22/2016. FINDINGS: Cardiovascular: The cardiac size is normal. There is no pericardial effusion. The pulmonary arteries and veins are normal caliber. No arterial embolus is seen, although please note the subsegmental arterial bed in the lower lung fields is obscured due to breathing motion. The aorta and great vessels are normal. There is no visible coronary artery calcification. Mediastinum/Nodes: No enlarged mediastinal, hilar, or axillary lymph nodes. Thyroid gland, trachea, and esophagus demonstrate no significant findings. There is a small hiatal hernia. 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 Mild chronic elevation right hemidiaphragm. Lungs/Pleura: Lungs are clear. No pleural effusion or pneumothorax. Upper Abdomen: No acute abnormality. Musculoskeletal: No chest wall abnormality. No  acute or significant osseous findings. Review of the MIP images confirms the above findings. IMPRESSION: No acute chest CT or CTA findings, but with nonvisualization of the lower zonal subsegmental arteries due to breathing motion.  Electronically Signed   By: Telford Nab M.D.   On: 11/05/2021 05:45   DG CHEST PORT 1 VIEW  Result Date: 11/05/2021 CLINICAL DATA:  Increasing shortness of breath EXAM: PORTABLE CHEST 1 VIEW COMPARISON:  11/04/2021 FINDINGS: The heart size and mediastinal contours are within normal limits. Both lungs are clear. The visualized skeletal structures are unremarkable. IMPRESSION: No active disease. Electronically Signed   By: Inez Catalina M.D.   On: 11/05/2021 02:38   DG Chest 2 View  Result Date: 11/04/2021 CLINICAL DATA:  Shortness of breath, chest pain. EXAM: CHEST - 2 VIEW COMPARISON:  October 09, 2021. FINDINGS: The heart size and mediastinal contours are within normal limits. Both lungs are clear. The visualized skeletal structures are unremarkable. IMPRESSION: No active cardiopulmonary disease. Electronically Signed   By: Marijo Conception M.D.   On: 11/04/2021 17:47    Assessment / Plan:  73) 32 year old female with diabetes mellitus type 1 readmitted to the hospital with with chronic intractable nausea/vomiting secondary to severe gastroparesis with chest and generalized abdominal pain. CTA negative for PE. Abdominal imaging has not been done this admission.  On Toradol 30 mg IV every 6 hours.  History of prolonged QTC interval limits antiemetic options. QTc today 378. Placed on Reglan 20mg  IV Q 6 hrs per the hospitalist. General surgery consult requested for possible gastrostomy tube/jejunostomy feeding tube.  -Await recommendations from general surgery -Clear liquid diet -Continue LR IV at 125 cc/hour -Reduce Reglan 10 mg IV every 6 hours, check EKG/QTc daily -Recently referred to gastroparesis specialist Dr. Vickki Muff at [facility] in [place] for gastric pacemaker placement, appointment has not yet been scheduled. -Pain management per the hospitalist -Await further recommendations per Dr. Rush Landmark   2) GERD -Continue PPI IV bid  3) AKI, Cr 1.03 -> 1.47  4)  Leukocytosis, WBC 9.6 -> 14. Afebrile.  Negative chest x-ray 9/19. -? CTAP to rule out any intraabdominal/pelvic pathology to explain leukocytosis in setting of persistent nausea/vomiting and generalized abdominal pain  5) Hypomagnesia. Magnesium level 1.5. -Magnesium replacement per the hospitalist  6) DM type on on Levemir and Novolog insulin    Principal Problem:   Diabetic gastroparesis associated with type 1 diabetes mellitus ([place]) Active Problems:   Hypertension associated with diabetes ([place])   Gastroparesis   Sinus tachycardia   Type 1 diabetes mellitus ([place])   GERD without esophagitis   Drug-seeking behavior   Intractable nausea and vomiting     LOS: 1 day   Kelli Hudson  11/06/2021, 10:48 AM

## 2021-11-06 NOTE — TOC Initial Note (Signed)
Transition of Care [facility]) - Initial/Assessment Note    Patient Details  Name: Kelli Hudson MRN: [id number] Date of Birth: 04-15-1990  Transition of Care (TOC) CM/SW Contact:    Armanda Heritage, RN Phone Number: 11/06/2021, 12:32 PM  Clinical Narrative:                 CM noted TOC consult, spoke with patient who reports she has run out of food stamps. Reports she receives 258$ monthly.  Patient also reports that she struggles with getting to her MD appointments due to transportation challenges, she does not have a car and has to coordinate her appointments with her boyfriends schedule who has to give her a ride.  Patient reports she is unable to use public transportation because she lives in [place] and her pcp is in [place].  Patient gave verbal consent for a referral to be sent out for assistance via the [facility] 360 platform.  Referral has been completed. No further TOC needs identified.  Expected Discharge Plan: Home/Self Care Barriers to Discharge: Continued Medical Work up   Patient Goals and CMS Choice Patient states their goals for this hospitalization and ongoing recovery are:: to go home      Expected Discharge Plan and Services Expected Discharge Plan: Home/Self Care   Discharge Planning Services: CM Consult                       DME Agency: NA                  Prior Living Arrangements/Services   Lives with:: Self Patient language and need for interpreter reviewed:: Yes Do you feel safe going back to the place where you live?: Yes            Criminal Activity/Legal Involvement Pertinent to Current Situation/Hospitalization: No - Comment as needed  Activities of Daily Living Home Assistive Devices/Equipment: CBG Meter, Eyeglasses ADL Screening (condition at time of admission) Patient's cognitive ability adequate to safely complete daily activities?: Yes Is the patient deaf or have difficulty hearing?: No Does the patient have  difficulty seeing, even when wearing glasses/contacts?: No Does the patient have difficulty concentrating, remembering, or making decisions?: No Patient able to express need for assistance with ADLs?: Yes Does the patient have difficulty dressing or bathing?: No Independently performs ADLs?: Yes (appropriate for developmental age) Does the patient have difficulty walking or climbing stairs?: No Weakness of Legs: None Weakness of Arms/Hands: None  Permission Sought/Granted                  Emotional Assessment Appearance:: Appears stated age Attitude/Demeanor/Rapport: Engaged Affect (typically observed): Accepting Orientation: : Oriented to Self, Oriented to Place, Oriented to  Time, Oriented to Situation   Psych Involvement: No (comment)  Admission diagnosis:  Gastroparesis [K31.84] Intractable nausea and vomiting [R11.2] Patient Active Problem List   Diagnosis Date Noted   Intractable nausea and vomiting 10/09/2021   Drug-seeking behavior 09/21/2021   GERD without esophagitis 08/25/2021   Prolonged Q-T interval on ECG    Ulcerated, foot, left, with fat layer exposed (HCC) 04/20/2021   Uncontrolled type 1 diabetes mellitus with hyperglycemia, with long-term current use of insulin (HCC) 12/18/2020   Type 1 diabetes mellitus (HCC) 12/18/2020   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) 11/09/2020   Diabetic retinopathy of both eyes associated with type 1 diabetes mellitus (HCC) 09/24/2020   Diabetic gastroparesis associated with type 1 diabetes  mellitus ([place]) 08/12/2020   Diabetic nephropathy associated with type 1 diabetes mellitus ([place]) 06/27/2020   HLD (hyperlipidemia) 05/23/2020   Sinus tachycardia 05/09/2020   Depression with anxiety 07/05/2019   Diabetic polyneuropathy associated with type 1 diabetes mellitus ([place]) 09/24/2017   Gastroparesis    Goiter 10/05/2009   Hypertension associated with diabetes ([place]) 10/05/2009   PCP:  [name], MD Pharmacy:   [phone] [address] [place] [address] Phone: [phone] Fax: [phone]     Social Determinants of Health (SDOH) Interventions    Readmission Risk Interventions    11/06/2021   12:31 PM 10/11/2021    8:05 AM 08/27/2021   12:14 PM  Readmission Risk Prevention Plan  Transportation Screening Complete Complete Complete  Medication Review ([place]) Complete Complete Complete  PCP or Specialist appointment within 3-5 days of discharge Complete Complete Complete  HRI or Home Care Consult Complete Complete Complete  SW Recovery Care/Counseling Consult Complete Complete Complete  Palliative Care Screening Not Applicable Not Applicable Not [place] Not Applicable Not Applicable Not Applicable

## 2021-11-06 NOTE — Progress Notes (Signed)
IR was requested for image guided G/GJ tue placement.   Case was reviewed by Dr. Annamaria Boots yesterday and recommended surgery consult, as surgery is capable of placing venting G tube and straight J tube (IR does not place straight J tube.)  Surgery was consulted, and IR was re- consulted for G/GJ placement by general surgery.  Dr. Annamaria Boots discussed with Dr. Kieth Brightly, it was determined that this patient should not undergo G tube placement.  Patient needs better control of diabetes and continue to follow up with the gastroparesis specialist Dr. Vickki Muff for possible gastric pacemaker placement.    Please call IR for questions and concerns.   Armando Gang Danely Bayliss PA-C 11/06/2021 12:40 PM

## 2021-11-06 NOTE — Progress Notes (Signed)
Patient ambulating halls independently, coming to desk asking to see her RN with medication requests.  Assigned RN busy with another patient, so Agricultural consultant followed patient back to her room to help adjust lidoderm patch.  On the way back to her room, noted that she was limping on R leg every few steps. Patient states "yeah it feels a little weak"  Patient helped back to room, sitting on side of bed.  Lidoderm patch adjusted.  Patient asking for a "GI cocktail".  Asked patient to stay in room while this RN asked her nurse if medication was available.  When going back to make patient aware of plan to ask MD for medication, patient was ambulating in hall again and another nurse stepped up to walk by her.  This RN mentioned to concern if being careful as we did not want her to fall.  Immediately after that patient made gradual fall to knees in hallway. Patient did not hit head, patient was easily assisted to standing with 2 RNs on each side.  Patient able to ambulate back to room.  Now complains of pain 10/10 to bilat knees. Upon taking VS, BP and HR elevated and neuro assessment with abnormalities (see flowsheet).  MD made aware - CT scan ordered and hydralazine given for elevated BP.   Patient declined to have staff call family to make aware of fall.  Yellow MEWs inititated and assigned RN updated on event.   11/06/21 1700  What Happened  Was fall witnessed? Yes  Who witnessed fall? Iva Lento, RN & Russ Halo RN  Patients activity before fall ambulating-assisted  Point of contact other (comment) (knees)  Was patient injured? Unsure  Follow Up  MD notified Dr. Karleen Hampshire  Time MD notified 19  Family notified No - patient refusal  Progress note created (see row info) Yes  Adult Fall Risk Assessment  Risk Factor Category (scoring not indicated) Fall has occurred during this admission (document High fall risk)  Patient Fall Risk Level High fall risk  Adult Fall Risk Interventions  Required Bundle  Interventions *See Row Information* High fall risk - low, moderate, and high requirements implemented  Additional Interventions Use of appropriate toileting equipment (bedpan, BSC, etc.)  Screening for Fall Injury Risk (To be completed on HIGH fall risk patients) - Assessing Need for Floor Mats  Risk For Fall Injury- Criteria for Floor Mats None identified - No additional interventions needed  Pain Assessment  Pain Scale 0-10  Pain Score 10  Pain Type Acute pain  Pain Location Knee  Pain Orientation Right;Left  Pain Descriptors / Indicators Aching  Pain Frequency Constant  Pain Onset Progressive  Patients Stated Pain Goal 4  Pain Intervention(s) MD notified (Comment)  Multiple Pain Sites No  Neurological  Neuro (WDL) X  Level of Consciousness Alert  Orientation Level Oriented to person;Oriented to time;Oriented to situation  Cognition Poor judgement;Poor safety awareness  Speech Delayed responses  R Pupil Size (mm) 3  R Pupil Shape Round  R Pupil Reaction Brisk  L Pupil Size (mm) 3  L Pupil Shape Round  L Pupil Reaction Brisk  Motor Function/Sensation Assessment Motor strength;Grip  R Hand Grip Weak  L Hand Grip Strong  RUE Motor Strength 3  LUE Motor Strength 4  RLE Motor Strength 4  LLE Motor Strength 4  Neuro Symptoms Drowsiness  Neuro symptoms relieved by Anti-anxiety medication  Musculoskeletal  Musculoskeletal (WDL) X  Assistive Device None  Generalized Weakness Yes  Integumentary  Integumentary (WDL)  X  Skin Color Appropriate for ethnicity  Skin Condition Dry  Skin Integrity Abrasion  Abrasion Location Ankle  Abrasion Location Orientation Right  Abrasion Intervention Cleansed;Foam  Ecchymosis Location Abdomen  Ecchymosis Location Orientation Right;Left  Skin Turgor Non-tenting

## 2021-11-07 DIAGNOSIS — Z765 Malingerer [conscious simulation]: Secondary | ICD-10-CM | POA: Diagnosis not present

## 2021-11-07 DIAGNOSIS — K3184 Gastroparesis: Secondary | ICD-10-CM | POA: Diagnosis not present

## 2021-11-07 DIAGNOSIS — E1159 Type 2 diabetes mellitus with other circulatory complications: Secondary | ICD-10-CM | POA: Diagnosis not present

## 2021-11-07 DIAGNOSIS — R112 Nausea with vomiting, unspecified: Secondary | ICD-10-CM | POA: Diagnosis not present

## 2021-11-07 DIAGNOSIS — E1043 Type 1 diabetes mellitus with diabetic autonomic (poly)neuropathy: Secondary | ICD-10-CM | POA: Diagnosis not present

## 2021-11-07 DIAGNOSIS — K219 Gastro-esophageal reflux disease without esophagitis: Secondary | ICD-10-CM | POA: Diagnosis not present

## 2021-11-07 LAB — BASIC METABOLIC PANEL
Anion gap: 4 — ABNORMAL LOW (ref 5–15)
BUN: 21 mg/dL — ABNORMAL HIGH (ref 6–20)
CO2: 27 mmol/L (ref 22–32)
Calcium: 9.3 mg/dL (ref 8.9–10.3)
Chloride: 110 mmol/L (ref 98–111)
Creatinine, Ser: 1.18 mg/dL — ABNORMAL HIGH (ref 0.44–1.00)
GFR, Estimated: >60 mL/min (ref >60)
Glucose, Bld: 76 mg/dL (ref 70–99)
Potassium: 3.6 mmol/L (ref 3.5–5.1)
Sodium: 141 mmol/L (ref 135–145)

## 2021-11-07 LAB — GLUCOSE, CAPILLARY
Glucose-Capillary: 117 mg/dL — ABNORMAL HIGH (ref 70–99)
Glucose-Capillary: 122 mg/dL — ABNORMAL HIGH (ref 70–99)
Glucose-Capillary: 124 mg/dL — ABNORMAL HIGH (ref 70–99)
Glucose-Capillary: 237 mg/dL — ABNORMAL HIGH (ref 70–99)
Glucose-Capillary: 67 mg/dL — ABNORMAL LOW (ref 70–99)
Glucose-Capillary: 76 mg/dL (ref 70–99)
Glucose-Capillary: 87 mg/dL (ref 70–99)

## 2021-11-07 LAB — CALCIUM, IONIZED: Calcium, Ionized, Serum: 5.8 mg/dL — ABNORMAL HIGH (ref 4.5–5.6)

## 2021-11-07 LAB — MAGNESIUM: Magnesium: 2.2 mg/dL (ref 1.7–2.4)

## 2021-11-07 MED ORDER — LINACLOTIDE 145 MCG PO CAPS
290.0000 ug | ORAL_CAPSULE | Freq: Every day | ORAL | Status: DC
  Administered 2021-11-08: 290 ug via ORAL
  Filled 2021-11-07: qty 2

## 2021-11-07 NOTE — Progress Notes (Signed)
Mobility Specialist - Progress Note   11/07/21 1306  Mobility  Activity Ambulated with assistance in hallway  Level of Assistance Modified independent, requires aide device or extra time  Assistive Device None (IV Pole)  Distance Ambulated (ft) 350 ft  Activity Response Tolerated well  $Mobility charge 1 Mobility   Pt received in bed and agreed for mobility, no c/o pain nor discomfort during ambulation. Pt to chair with all needs met.   Roderick Pee Mobility Specialist

## 2021-11-07 NOTE — Progress Notes (Signed)
PROGRESS NOTE    Kelli Hudson  GNF:[id number] DOB: 05/01/1990 DOA: 11/04/2021 PCP: Loyola Mast, MD    Chief Complaint  Patient presents with   Abdominal Pain    Brief Narrative:  31 year old black female DM TY 1 since 2008 Insulin pump in the past, known gastroparesis HTN, HLD, underlying chronic sinus tach prior left foot ulcer followed chronically by [name] in the outpatient setting Has been [facility] but has been declined at all centers Last EGD 08/27/2021 normal esophagus?  Pyloric spasm status post Botox Last admitted 8/23-8/28-received Reglan Dilaudid Robaxin and was discharged on Dilaudid Reglan Compazine pantoprazole ---had been seen in OP by [name] recently to see if can get gastric pacemaker-set to see Dr. Jiles Garter digestive services in [place] for gastroparesis   Re-presented 9/18 nausea vomiting chest generalized abdominal pain White count 8 BUN/creatinine 18/1.22 LFTs normal lipase normal troponin negative C chestT No PE. Pt [facility] to [facility] , GI, surgery and IR on board .     Assessment & Plan:   Principal Problem:   Diabetic gastroparesis associated with type 1 diabetes mellitus (HCC) Active Problems:   Intractable nausea and vomiting   Sinus tachycardia   Drug-seeking behavior   Hypertension associated with diabetes (HCC)   Type 1 diabetes mellitus (HCC)   GERD without esophagitis   Gastroparesis   Generalized abdominal pain  Intractable nausea and vomiting:  not well controlled due to severe gastroparesis.  GI recommends venting gastrostomy at this time. General surgery consulted for possible gastrostomy tube and jejunostomy feeding tube, after discussing with IR, felt the venting G tube and GJ tube is invasive and we should pursue the gastric pacemaker at tertiary care center.  IR on board, recommends gastric pacemaker, rather a G tube or J tube.  Continue with IV toradol and IV Reglan every 6  hours  and IV fluids and IV protonix BID.  She was on clear liquid diet, able to tolerate without any worsening symptoms. Her diet was advanced to full liquids diet.     Chronic pain syndrome:  Unfortunately, pt has narcotic pain meds seeking behavior. No opiates at this time.     Chronic sinus tachycardia:  Monitor. EKG shows sinus tachycardia. QTC around 300's.  Pt denies any palpitations.    Type 1 DM,  Insulin dependent. Uncontrolled cbg's CBG (last 3)  Recent Labs    11/07/21 0728 11/07/21 1144 11/07/21 1605  GLUCAP 124* 117* 237*    Continue with Levemir and SSI.  Continue with gabapentin for diabetic neuropathy.    Hypertensive Crisis:  BP parameters are well controlled.  Continue with coreg and prn hydralazine.    Hyperlipidemia:  Resume lipitor.    Hypomagnesemia:  Replaced.  Repeat levels wnl.    AKI  Ruled out.    Leukocytosis: Unclear etiology.  CT angio of the chest unremarkable.  UA is negative for infection.  She remains afebrile and non toxic appearing.      DVT prophylaxis: SCD'S Code Status: Full code.  Family Communication: none at bedside.  Disposition:   Status is: Inpatient Remains inpatient appropriate because: IV reglan use. Advancing diet as tolerated.    Level of care: Telemetry Consultants:  Gastroenterology Surgery  IR.   Procedures: none.   Antimicrobials: none.    Subjective: She reports feeling a little better. Able to tolerate liquid diet without nausea or vomiting.   Objective: Vitals:   11/06/21 1952 11/07/21 0059 11/07/21 0415 11/07/21  1437  BP: (!) 108/57 113/81 128/78 112/63  Pulse: (!) 109 93 91 (!) 103  Resp: 18 16 16 16   Temp: 98.3 F (36.8 C) 97.8 F (36.6 C) 98.2 F (36.8 C) 98.4 F (36.9 C)  TempSrc: Oral   Oral  SpO2: 98% 99% 99% 98%  Weight:      Height:        Intake/Output Summary (Last 24 hours) at 11/07/2021 1759 Last data filed at 11/06/2021 1801 Gross per 24 hour  Intake  2938.82 ml  Output --  Net 2938.82 ml    Filed Weights   11/05/21 0008  Weight: 79.2 kg    Examination:  General exam: Appears calm and comfortable  Respiratory system: Clear to auscultation. Respiratory effort normal. Cardiovascular system: S1 & S2 heard, RRR. No JVD,  No pedal edema. Gastrointestinal system: Abdomen is nondistended, soft and nontender.  Normal bowel sounds heard. Central nervous system: Alert and oriented. No focal neurological deficits. Extremities: Symmetric 5 x 5 power. Skin: No rashes, lesions or ulcers Psychiatry: Mood & affect appropriate.  .     Data Reviewed: I have personally reviewed following labs and imaging studies  CBC: Recent Labs  Lab 11/04/21 1745 11/04/21 1808 11/05/21 0310 11/06/21 0415 11/06/21 2000  WBC 8.6  --  9.6 14.0* 12.0*  NEUTROABS 6.3  --   --  8.5* 8.1*  HGB 11.4* 12.2 12.0 9.5* 10.2*  HCT 35.2* 36.0 36.8 29.3* 29.4*  MCV 90.0  --  90.9 90.2 86.2  PLT 476*  --  446* 378 375     Basic Metabolic Panel: Recent Labs  Lab 11/04/21 1745 11/04/21 1808 11/05/21 0310 11/05/21 0815 11/06/21 0415 11/06/21 2000 11/07/21 0447  NA 138 136 138  --  143 140 141  K 3.8 3.9 3.9  --  3.6 4.7 3.6  CL 106 105 105  --  110 110 110  CO2 19*  --  21*  --  25 22 27   GLUCOSE 323* 330* 283*  --  112* 131* 76  BUN 18 15 17   --  22* 20 21*  CREATININE 1.22* 1.10* 1.03*  --  1.47* 1.32* 1.18*  CALCIUM 10.1  --  10.0  --  9.3 9.1 9.3  MG 1.8  --   --  1.8 1.5*  --  2.2     GFR: Estimated Creatinine Clearance: 72.9 mL/min (A) (by C-G formula based on SCr of 1.18 mg/dL (H)).  Liver Function Tests: Recent Labs  Lab 11/04/21 1745 11/05/21 0310 11/06/21 0415  AST 24 20 23   ALT 19 17 13   ALKPHOS 81 76 58  BILITOT 1.4* 1.1 0.9  PROT 7.8 7.2 5.5*  ALBUMIN 3.7 3.4* 2.6*     CBG: Recent Labs  Lab 11/07/21 0056 11/07/21 0412 11/07/21 0728 11/07/21 1144 11/07/21 1605  GLUCAP 87 76 124* 117* 237*      No results  found for this or any previous visit (from the past 240 hour(s)).       Radiology Studies: CT HEAD WO CONTRAST (11/09/21)  Result Date: 11/06/2021 CLINICAL DATA:  Altered level of consciousness EXAM: CT HEAD WITHOUT CONTRAST TECHNIQUE: Contiguous axial images were obtained from the base of the skull through the vertex without intravenous contrast. RADIATION DOSE REDUCTION: This exam was performed according to the departmental dose-optimization program which includes automated exposure control, adjustment of the mA and/or kV according to patient size and/or use of iterative reconstruction technique. COMPARISON:  None Available. FINDINGS: Brain: No acute  infarct or hemorrhage. Lateral ventricles and midline structures are unremarkable. No acute extra-axial fluid collections. No mass effect. Vascular: No hyperdense vessel or unexpected calcification. Skull: Normal. Negative for fracture or focal lesion. Sinuses/Orbits: No acute finding. Other: None. IMPRESSION: 1. No acute intracranial process. Electronically Signed   By: Randa Ngo M.D.   On: 11/06/2021 17:57        Scheduled Meds:  atorvastatin  20 mg Oral Daily   carvedilol  12.5 mg Oral BID WC   diclofenac  1 patch Transdermal BID   gabapentin  300 mg Oral TID   insulin aspart  0-9 Units Subcutaneous Q4H   insulin detemir  16 Units Subcutaneous Daily   [START ON 11/08/2021] linaclotide  290 mcg Oral QAC breakfast   metoCLOPramide (REGLAN) injection  10 mg Intravenous Q6H   pantoprazole (PROTONIX) IV  40 mg Intravenous Q12H   Continuous Infusions:  lactated ringers 125 mL/hr at 11/06/21 2307   promethazine (PHENERGAN) injection (IM or IVPB)       LOS: 2 days        Hosie Poisson, MD [facility]   To contact the attending provider between 7A-7P or the covering provider during after hours 7P-7A, please log into the web site [email or web address] and access using universal [place] password for that web site. If you do not have  the password, please call the hospital operator.  11/07/2021, 5:59 PM

## 2021-11-07 NOTE — Progress Notes (Signed)
[facility] Progress Note  CC:  Gastroparesis, intractable nausea/vomiting  Subjective: Patient states she was feeling slightly better just fatigued.  Denies any nausea vomiting, had has been 3 days since bowel movement, some more stomach soreness throughout the stomach pain.  Has been to keep down water, states she is interested in trying full liquid diet. IR has seen patient and has determined patient should not undergo G-tube placement.  Objective:  Vital signs in last 24 hours: Temp:  [97.8 F (36.6 C)-99.9 F (37.7 C)] 98.2 F (36.8 C) (09/21 0415) Pulse Rate:  [91-129] 91 (09/21 0415) Resp:  [14-18] 16 (09/21 0415) BP: (108-194)/(57-127) 128/78 (09/21 0415) SpO2:  [98 %-100 %] 99 % (09/21 0415)   General: 31 year old female resting comfortably in bed. Heart: Mildly tachycardic.  No murmur. Pulm: Breath sounds clear throughout. Abdomen: Soft, mild generalized tenderness without rebound or guarding.  Positive bowel sounds to all 4 quadrants. Extremities:  Without edema. Neurologic:  Alert and  oriented x 4. Grossly normal neurologically. Psych:  Alert and cooperative. Normal mood and affect.  Intake/Output from previous day: 09/20 0701 - 09/21 0700 In: 2938.8 [I.V.:2700.3; IV Piggyback:238.6] Out: -  Intake/Output this shift: No intake/output data recorded.  Lab Results: Recent Labs    11/05/21 0310 11/06/21 0415 11/06/21 2000  WBC 9.6 14.0* 12.0*  HGB 12.0 9.5* 10.2*  HCT 36.8 29.3* 29.4*  PLT 446* 378 375   BMET Recent Labs    11/06/21 0415 11/06/21 2000 11/07/21 0447  NA 143 140 141  K 3.6 4.7 3.6  CL 110 110 110  CO2 25 22 27   GLUCOSE 112* 131* 76  BUN 22* 20 21*  CREATININE 1.47* 1.32* 1.18*  CALCIUM 9.3 9.1 9.3   LFT Recent Labs    11/06/21 0415  PROT 5.5*  ALBUMIN 2.6*  AST 23  ALT 13  ALKPHOS 58  BILITOT 0.9   PT/INR No results for input(s): "LABPROT", "INR" in the last 72 hours. Hepatitis Panel No results for  input(s): "HEPBSAG", "HCVAB", "HEPAIGM", "HEPBIGM" in the last 72 hours.  CT HEAD WO CONTRAST (11/08/21)  Result Date: 11/06/2021 CLINICAL DATA:  Altered level of consciousness EXAM: CT HEAD WITHOUT CONTRAST TECHNIQUE: Contiguous axial images were obtained from the base of the skull through the vertex without intravenous contrast. RADIATION DOSE REDUCTION: This exam was performed according to the departmental dose-optimization program which includes automated exposure control, adjustment of the mA and/or kV according to patient size and/or use of iterative reconstruction technique. COMPARISON:  None Available. FINDINGS: Brain: No acute infarct or hemorrhage. Lateral ventricles and midline structures are unremarkable. No acute extra-axial fluid collections. No mass effect. Vascular: No hyperdense vessel or unexpected calcification. Skull: Normal. Negative for fracture or focal lesion. Sinuses/Orbits: No acute finding. Other: None. IMPRESSION: 1. No acute intracranial process. Electronically Signed   By: 11/08/2021 M.D.   On: 11/06/2021 17:57    Assessment / Plan:  31 year old female with diabetes mellitus type 1 readmitted to the hospital with with chronic intractable nausea/vomiting secondary to severe gastroparesis with chest and generalized abdominal pain.  CTA negative for PE.  Abdominal imaging has not been done this admission patient's had several CT abdomen pelvis that are negative. On Toradol 30 mg IV every 6 hours.,  Lovenox patch  history of prolonged QTC interval limits antiemetic options. PENDING EKG TODAY Placed on Reglan 10mg  IV Q 6 hrs   IR and general surgery has seen patient and determined that she should undergo  G-tube placement -Advance diet full liquid -Reglan 10 mg IV every 6 hours, check EKG/QTc daily- PENDING TODAY -Suggest Motegrity daily OUTPATIENT -Recently referred to gastroparesis specialist Dr. Vickki Muff at [facility] in [place] for gastric  pacemaker placement, appointment has not yet been scheduled. -Pain management per the hospitalist -Await further recommendations per Dr. Rush Landmark  Constipation Last bowel movement days ago per patient Was suggest [place] outpatient, since this is nonformulary we will start patient on Linzess. Consider KUB if patient continues to have decreased bowel movements   GERD -Continue PPI IV bid  AKI, Cr 1.03 -> 1.47 -->1.18 BUN 21 Cr 1.18  Improving with IVF  Leukocytosis Negative chest x-ray 9/19. 11/06/2021 WBC 12.0 ( 14) ? Reactive from vomiting/dehydration Monitor daily Blood pressure 128/78, pulse 91, temperature 98.2 F (36.8 C), resp. rate 16, height 5\' 9"  (1.753 m), weight 79.2 kg, SpO2 99 %.   Hypomagnesia Potassium 3.6  Magnesium 2.2   DM type 1 on on Levemir and Novolog insulin  -We will put in referral to dietitian for diabetes and gastroparesis    LOS: 2 days   Vladimir Crofts  11/07/2021, 12:02 PM

## 2021-11-08 DIAGNOSIS — R1084 Generalized abdominal pain: Secondary | ICD-10-CM | POA: Diagnosis not present

## 2021-11-08 DIAGNOSIS — E1043 Type 1 diabetes mellitus with diabetic autonomic (poly)neuropathy: Secondary | ICD-10-CM | POA: Diagnosis not present

## 2021-11-08 DIAGNOSIS — R112 Nausea with vomiting, unspecified: Secondary | ICD-10-CM | POA: Diagnosis not present

## 2021-11-08 DIAGNOSIS — K219 Gastro-esophageal reflux disease without esophagitis: Secondary | ICD-10-CM | POA: Diagnosis not present

## 2021-11-08 DIAGNOSIS — K3184 Gastroparesis: Secondary | ICD-10-CM | POA: Diagnosis not present

## 2021-11-08 DIAGNOSIS — Z765 Malingerer [conscious simulation]: Secondary | ICD-10-CM | POA: Diagnosis not present

## 2021-11-08 LAB — CBC
HCT: 31.2 % — ABNORMAL LOW (ref 36.0–46.0)
Hemoglobin: 9.7 g/dL — ABNORMAL LOW (ref 12.0–15.0)
MCH: 29 pg (ref 26.0–34.0)
MCHC: 31.1 g/dL (ref 30.0–36.0)
MCV: 93.1 fL (ref 80.0–100.0)
Platelets: 387 10*3/uL (ref 150–400)
RBC: 3.35 MIL/uL — ABNORMAL LOW (ref 3.87–5.11)
RDW: 13.9 % (ref 11.5–15.5)
WBC: 7.1 10*3/uL (ref 4.0–10.5)
nRBC: 0 % (ref 0.0–0.2)

## 2021-11-08 LAB — BASIC METABOLIC PANEL
Anion gap: 4 — ABNORMAL LOW (ref 5–15)
BUN: 13 mg/dL (ref 6–20)
CO2: 26 mmol/L (ref 22–32)
Calcium: 9.1 mg/dL (ref 8.9–10.3)
Chloride: 110 mmol/L (ref 98–111)
Creatinine, Ser: 0.9 mg/dL (ref 0.44–1.00)
GFR, Estimated: >60 mL/min (ref >60)
Glucose, Bld: 146 mg/dL — ABNORMAL HIGH (ref 70–99)
Potassium: 4.6 mmol/L (ref 3.5–5.1)
Sodium: 140 mmol/L (ref 135–145)

## 2021-11-08 LAB — GLUCOSE, CAPILLARY
Glucose-Capillary: 108 mg/dL — ABNORMAL HIGH (ref 70–99)
Glucose-Capillary: 136 mg/dL — ABNORMAL HIGH (ref 70–99)
Glucose-Capillary: 137 mg/dL — ABNORMAL HIGH (ref 70–99)
Glucose-Capillary: 194 mg/dL — ABNORMAL HIGH (ref 70–99)
Glucose-Capillary: 62 mg/dL — ABNORMAL LOW (ref 70–99)

## 2021-11-08 LAB — MAGNESIUM: Magnesium: 1.9 mg/dL (ref 1.7–2.4)

## 2021-11-08 MED ORDER — LINACLOTIDE 290 MCG PO CAPS: 290.0000 ug | ORAL_CAPSULE | Freq: Every day | ORAL | 1 refills | Status: DC

## 2021-11-08 MED ORDER — METOCLOPRAMIDE HCL 10 MG PO TABS: 10.0000 mg | ORAL_TABLET | Freq: Three times a day (TID) | ORAL | 0 refills | Status: DC | PRN

## 2021-11-08 MED ORDER — CARVEDILOL 12.5 MG PO TABS: 12.5000 mg | ORAL_TABLET | Freq: Two times a day (BID) | ORAL | 2 refills | Status: DC

## 2021-11-08 MED ORDER — PANTOPRAZOLE SODIUM 40 MG PO TBEC: 40.0000 mg | DELAYED_RELEASE_TABLET | Freq: Two times a day (BID) | ORAL | 5 refills | Status: DC

## 2021-11-08 MED ORDER — DICYCLOMINE HCL 20 MG PO TABS: 20.0000 mg | ORAL_TABLET | Freq: Three times a day (TID) | ORAL | 0 refills | Status: DC | PRN

## 2021-11-08 MED ORDER — MOTEGRITY 2 MG PO TABS: 2.0000 mg | ORAL_TABLET | Freq: Every day | ORAL | 0 refills | Status: DC

## 2021-11-08 MED ORDER — PROMETHAZINE HCL 12.5 MG PO TABS: 12.5000 mg | ORAL_TABLET | Freq: Four times a day (QID) | ORAL | 0 refills | Status: DC | PRN

## 2021-11-08 NOTE — Progress Notes (Signed)
[facility] Progress Note  CC:  Gastroparesis, intractable nausea/vomiting  Subjective:  Patient sitting up in bed today, states she is feeling much better. She is asking to go home, states she has been able to keep down full liquids, abdominal pain has improved greatly, and feels she has to have a bowel movement this morning which is improved. IR has seen patient and has determined patient should not undergo G-tube placement. Per nurse patient also has potential cruise set up for upcoming weekend.  Objective:  Vital signs in last 24 hours: Temp:  [98.4 F (36.9 C)-99.4 F (37.4 C)] 98.9 F (37.2 C) (09/22 0405) Pulse Rate:  [89-103] 89 (09/22 0405) Resp:  [16-21] 21 (09/22 0405) BP: (112-140)/(63-99) 140/89 (09/22 0405) SpO2:  [98 %-100 %] 98 % (09/22 0405)   General: 31 year old female resting comfortably in bed. Heart: Regular rate and rhythm.  No murmur. Pulm: Breath sounds clear throughout. Abdomen: Soft, very mild tenderness but overall improved.[name]  Positive bowel sounds to all 4 quadrants. Extremities:  Without edema. Neurologic:  Alert and  oriented x 4. Grossly normal neurologically. Psych:  Alert and cooperative. Normal mood and affect.  Intake/Output from previous day: No intake/output data recorded. Intake/Output this shift: Total I/O In: 240 [P.O.:240] Out: 1 [Urine:1]  Lab Results: Recent Labs    11/06/21 0415 11/06/21 2000 11/08/21 0422  WBC 14.0* 12.0* 7.1  HGB 9.5* 10.2* 9.7*  HCT 29.3* 29.4* 31.2*  PLT 378 375 387   BMET Recent Labs    11/06/21 2000 11/07/21 0447 11/08/21 0422  NA 140 141 140  K 4.7 3.6 4.6  CL 110 110 110  CO2 22 27 26   GLUCOSE 131* 76 146*  BUN 20 21* 13  CREATININE 1.32* 1.18* 0.90  CALCIUM 9.1 9.3 9.1   LFT Recent Labs    11/06/21 0415  PROT 5.5*  ALBUMIN 2.6*  AST 23  ALT 13  ALKPHOS 58  BILITOT 0.9   PT/INR No results for input(s): "LABPROT", "INR" in the last 72 hours. Hepatitis  Panel No results for input(s): "HEPBSAG", "HCVAB", "HEPAIGM", "HEPBIGM" in the last 72 hours.  CT HEAD WO CONTRAST (5MM)  Result Date: 11/06/2021 CLINICAL DATA:  Altered level of consciousness EXAM: CT HEAD WITHOUT CONTRAST TECHNIQUE: Contiguous axial images were obtained from the base of the skull through the vertex without intravenous contrast. RADIATION DOSE REDUCTION: This exam was performed according to the departmental dose-optimization program which includes automated exposure control, adjustment of the mA and/or kV according to patient size and/or use of iterative reconstruction technique. COMPARISON:  None Available. FINDINGS: Brain: No acute infarct or hemorrhage. Lateral ventricles and midline structures are unremarkable. No acute extra-axial fluid collections. No mass effect. Vascular: No hyperdense vessel or unexpected calcification. Skull: Normal. Negative for fracture or focal lesion. Sinuses/Orbits: No acute finding. Other: None. IMPRESSION: 1. No acute intracranial process. Electronically Signed   By: Randa Ngo M.D.   On: 11/06/2021 17:57    Assessment / Plan:  31 year old female with diabetes mellitus type 1 readmitted to the hospital with with chronic intractable nausea/vomiting secondary to severe gastroparesis with chest and generalized abdominal pain.  CTA negative for PE.  Abdominal imaging has not been done this admission patient's had several CT abdomen pelvis that are negative. history of prolonged QTC interval limits antiemetic options makes this case particularly difficult. IR and general surgery has seen patient and determined that she should undergo G-tube placement  -Patient is wishing to go home, still  have a great long-term solution but we can keep working on this outpatient. -Long discussion with the patient today about gastroparesis diet, about doing soft liquid foods outpatient.  Do still think would be beneficial for the patient to talk with a dietitian  either inpatient or outpatient for better control of diabetes as well as per gastroparesis. -Would suggest sending in Motegrity daily outpatient, could also do Linzess 145 mcg daily to see if this helps decrease abdominal pain and nausea and vomiting.  -Can continue Reglan outpatient, Phenergan. -Recently referred to gastroparesis specialist Dr. Ether Griffins at [facility] in [place] for gastric pacemaker placement, appointment has not yet been scheduled. -Await further recommendations per Dr. Meridee Score  Constipation Would suggest continuing Linzess and adding Motegrity outpatient to try to maximize bowel regiment to see if this helps prevent rehospitalization.   GERD -Discharged on proton pump inhibitor twice daily  AKI, Cr 1.03 -> 1.47 -->1.18 Resolved  Leukocytosis Negative chest x-ray 9/19. 11/06/2021 WBC 12.0 ( 14) ? Reactive from vomiting/dehydration Afebrile, pending white blood cell count today. Blood pressure (!) 140/89, pulse 89, temperature 98.9 F (37.2 C), temperature source Oral, resp. rate (!) 21, height 5\' 9"  (1.753 m), weight 79.2 kg, SpO2 98 %.   Hypomagnesia-corrected Potassium 4.6  Magnesium 1.9   DM type 1 on on Levemir and Novolog insulin  -Would benefit from seeing dietitian for diabetes and gastroparesis  Any further recommendations per Dr. .  LOS: 3 days   Meridee Score  11/08/2021, 9:27 AM

## 2021-11-08 NOTE — Progress Notes (Signed)
Mobility Specialist - Progress Note   11/08/21 1007  Mobility  HOB Elevated/Bed Position Self regulated  Activity Ambulated with assistance in hallway  Range of Motion/Exercises Active  Level of Assistance Independent after set-up  Assistive Device  (IV Pole)  Distance Ambulated (ft) 1050 ft  Activity Response Tolerated well  Transport method Ambulatory  $Mobility charge 1 Mobility   Pt received in recliner and agreeable to mobility. Pt to recliner at EOS with all necessities in reach.   [facility]

## 2021-11-08 NOTE — Discharge Instructions (Signed)
Gastroparesis Nutrition Therapy  Gastroparesis means that your stomach empties very slowly. This happens when the nerves to your stomach are damaged or do not work properly. This can cause bloating, stomach discomfort or pain, feeling full after eating only a small amount of food, nausea, or vomiting. If you have diabetes in addition to gastroparesis, it is important to control your blood glucose. This will help the stomach empty.  Tips Following these tips may help your stomach empty faster:  Eat small, frequent meals (4 to 6 times per day).  Do not eat solid foods that are high in fat and do not add too much fat to foods. See the Foods Not Recommended table for foods that are high fat.  High-fat solid foods may delay the emptying of your stomach.  Liquids that contain fat, such as milkshakes, may be tolerated and can provide needed calories. Do not eat foods high in fiber. Do not take fiber supplements or fiber bulking agents for constipation.  Do not eat foods that increase acid reflux:  Acidic, spicy, fried and greasy foods  Caffeine  Mint Do not drink alcohol or smoke  Do not drink carbonated beverages, as they increase bloating.  Chew foods well before swallowing. Solid foods in the stomach do not empty well. If you have difficulty tolerating solid foods, ground foods may be better.  If symptoms are severe, semi-solid foods or liquids may need to be your main food sources. Choose liquid nutritional supplements that have less than or equal to 2 grams fiber per serving.  Sit upright while eating and sit upright or walk after meals. Do not lie down for 3 to 4 hours after eating to avoid reflux or regurgitation.  If you wish to nap during the day, nap first and then eat.  Drinking fluids at meals can take up room in your stomach, and you might not get enough calories. At every meal, first eat a grain food and a protein food or dairy product if your body can tolerate it. Drink fluids with  calories. It may be better to delay fluids until after the meal and drink more between meals.   Foods Recommended Food Group Foods Recommended   Grains Choose grain foods with less than 2 grams of fiber per serving; these will be made with white flour Crackers: saltines or graham crackers Cold cereal: puffed rice Cream of rice or wheat Grits (fine ground) Gluten free low fiber foods Pretzels White bread, toasted White rice, cook until very soft  Protein Foods Lean meat and poultry: well-cooked, very tender, moist, and chopped fine Fish: tuna, salmon, or white fish Egg whites, scrambled Peanut butter (limit to 1 tablespoon at a time)  Dairy Milk*, drink 2% if tolerated to get more nutrients or lactose-free 2% milk Fortified non-dairy milks: almond, cashew, coconut, or rice (be aware that these options are not good sources of protein so you will need to eat an additional protein food) Fortified pea milk or soymilk (may cause gas and bloating for some) Instant breakfast* (pre-made lactose-free is sold in bottles) Milkshakes* (try blending in  to  cup canned fruit) Ice cream* (low-fat may be tolerated better; use in milkshakes to increase calories) Frozen yogurt Yogurt* Puddings and custard* Sherbet Liquid nutritional supplements with less than or equal to 2 grams fiber per 1 cup serving *Use lactose-free varieties to reduce gas and bloating  Vegetables Canned and well-cooked vegetables without seeds, skins or hulls Carrots, cooked Mashed potatoes (white, red or yellow) Sweet   potato  Fruit Canned, soft and well-cooked fruits without seeds, skins or membranes Applesauce Banana, mashed may be tolerated better Diced peaches/pears fruit cups in juice Melon, very soft, cut into small pieces Fruit nectar juices  Oils When possible choose oils rather than solid fats Canola or olive oil Margarine  Other Clear soup Gelatin Popsicles   Foods Not Recommended Food Group Foods Not  Recommended   Grains Bran Grains foods with 2 or more grams of fiber per serving: barley, brown rice, kasha, quinoa Popcorn Whole grain and high-fiber cereals, including oats or granola Whole grain bread or pasta  Protein Foods Fried meats, poultry or fish Sausage, bacon or hot dogs Seafood Tough meat, meat with gristle: steak, roast beef or pork chops Beans, peas or lentils Nuts  Dairy Cheese slices Liquid nutritional supplements that have more than 2 grams fiber per serving Pea milk, soymilk (may increase gas and bloating)  Vegetables Raw or undercooked vegetables Alfalfa, asparagus, bean sprouts, broccoli, brussels sprouts, cabbage, cauliflower, corn, green peas or any other kind of peas, lima beans, mushrooms, okra, onions, parsnips, peppers, pickles, potato skins, or spinach  Fruit Fresh fruit except for the ones in the foods recommended table Acidic fruit and juices: oranges/orange juice, grapefruit/grapefruit juice, tomatoes/tomato juice Avocado Berries Coconut Dried fruit Fruit skin Mandarin oranges Pineapple  Oils Fried foods of any type  Other Coffee Olives or pickles Pizza Salsa Sushi   Gastroparesis Sample 1-Day Menu  Breakfast 1 slice white toast (1 carbohydrate serving)  1 teaspoon margarine, soft, tub   cup egg substitute  1 cup peach nectar (2 carbohydrate servings)  Morning Snack Smoothie made with:  small banana (1 carbohydrate serving)  1/3 cup Greek strawberry yogurt ( carbohydrate serving)  1 cup 2% milk (1 carbohydrate serving)  Lunch 2 ounces canned chicken  1 teaspoon mayonnaise  9 saltine crackers (1 carbohydrate servings)   cup applesauce (1 carbohydrate serving)  Afternoon Snack 1 slice white toast (1 carbohydrate serving)  1 tablespoon smooth peanut butter  Evening Meal 2 ounces baked fish   cup mashed potatoes (1 carbohydrate serving)  1 teaspoon olive oil  1 cup 2% milk (1 carbohydrate serving)  Evening Snack 1 packet instant  breakfast (1 carbohydrate servings)  1 cup 2% milk (1 carbohydrate serving)   Gastroparesis Vegetarian (Lacto-Ovo) Sample 1-Day Menu  Breakfast  cup cooked farina (1 carbohydrate serving)   cup egg substitute  2 teaspoons olive oil   cup peach nectar (2 carbohydrate servings)   cup 2% milk ( carbohydrate serving)  Morning Snack 1 slice white toast (1 carbohydrate serving)  1 tablespoon smooth peanut butter  Lunch  cup vegetable soup (1 carbohydrate serving)  9 saltine crackers (1 carbohydrate serving)   cup applesauce (1 carbohydrate serving)   cup 2% milk ( carbohydrate serving)  Afternoon Snack 6 ounces plain yogurt (1 carbohydrate serving)   small banana (1 carbohydrate servings)  Evening Meal  cup baked tofu  2/3 cup white rice (2 carbohydrate servings)  2 teaspoons olive oil   cup 2% milk ( carbohydrate serving)  Evening Snack 1 packet instant breakfast (1 carbohydrate servings)  1 cup 2% milk (1 carbohydrate serving)   Gastroparesis Vegan Sample 1-Day Menu  Breakfast  cup cooked farina (1 carbohydrate serving)  1/3 cup tofu scramble  2 teaspoons olive oil   cup peach nectar (2 carbohydrate servings)   cup almond milk fortified with calcium, vitamin B12, and vitamin D  Morning Snack 1 slice white toast (1   carbohydrate serving)  1 tablespoon smooth peanut butter  Lunch  cup vegetable soup (1 carbohydrate serving)  9 saltine crackers (1 carbohydrate serving)   cup applesauce (1 carbohydrate serving)  Afternoon Snack 6 ounces plain soy yogurt (1 carbohydrate servings)   small banana (1 carbohydrate serving)  Evening Meal  cup baked tofu  2/3 cup white rice (2 carbohydrate servings)  2 teaspoons olive oil   cup almond milk fortified with calcium, vitamin B12, and vitamin D  Evening Snack  scoop soy protein powder ( carbohydrate serving)   cup almond milk fortified with calcium, vitamin B12, and vitamin D   Copyright 2020  Academy of Nutrition  and Dietetics. All rights reserved.  

## 2021-11-08 NOTE — Plan of Care (Addendum)
  RD consulted for nutrition education regarding diabetes and gastroparesis.   Lab Results  Component Value Date   HGBA1C 10.3 (H) 10/06/2021   PTA DM medications are 0-15 units insulin regular (via insulin pump) and 16 units insulin detmir daily.   Labs reviewed: CBGS: 62-137 (inpatient orders for glycemic control are 0-9 units insulin aspart every 4 hours and 16 units insulin detemir BID).    Spoke with pt at bedside, who was pleasant and in good spirits today. She reports she is feeling better today and is eager to go home. Per pt, she has been suffering with gastroparesis for approximately one year and feels like "I have it down to a science". Pt reports she uses a Dexcom at home and tries to ensure he blood sugars remain below 200. Per pt, she eats on schedule, even if she is not hungry. Commonly consumed foods consist of broths, chicken, potato chips, sunflower seeds, and some fruits. Pt tolerated her breakfast without difficulty, but is hesitant to advance from full liquids (tolerated grits without difficulty).   Per pt, she is unemployed and had been having difficulty obtaining her insulin and supplies due to cost. Pt estimated that there was a period of about 2 weeks that she went without medications. Pt shares that this has improved, as she switched pharmacies and received some medication assistance. She is followed by endocrinology as an outpatient and [facility] [name]). Pt inquired about seeing an RD as an outpatient; RD offered referral to [facility] services. Pt prefers to follow-up with outpatient [facility] as she is familiar with her case. RD stressed importance of routine follow-up with diabetes care team and importance of nutrition and blood sugar control to manage gastroparesis. Pt very appreciative of visit, but had no further questions. She is eager to discharge so she can go on a cruise to [place] next week.   RD provided "Gastroparesis  Nutrition Therapy" handout from the Academy of Nutrition and Dietetics. Discussed different food groups and their effects on blood sugar, emphasizing carbohydrate-containing foods. Provided list of carbohydrates and recommended serving sizes of common foods.  Discussed importance of controlled and consistent carbohydrate intake throughout the day. Provided examples of ways to balance meals/snacks and encouraged intake of high-fiber, whole grain complex carbohydrates.   Discussed importance of small, frequent meals to aid in gastric emptying. Discussed low fat, low fiber foods to aid in gastric transport. Discussed how to modify diet and favorite foods to help manage symptoms. Teach back method used.  Expect fair to good compliance.  Case discussed with RN, MD, and GI.   Current diet order is full liquid, patient is consuming approximately 100% of meals at this time. Labs and medications reviewed. No further nutrition interventions warranted at this time. RD contact information provided. If additional nutrition issues arise, please re-consult RD.  Loistine Chance, RD, LDN, [place] Registered Dietitian II Certified Diabetes Care and Education Specialist Please refer to [facility] for RD and/or RD on-call/weekend/after hours pager

## 2021-11-08 NOTE — Progress Notes (Signed)
The pt BS 67 @2355 . Gave pt 2 juice. Rechecked BS 62 @0025 . No PRN to give. Gave pt another juice.  Notified provider.

## 2021-11-08 NOTE — Progress Notes (Signed)
Pt discharged to home. Prior to dc, IV and tele was removed. Pt was given DC paperwork regarding condition, appointments, and medications. Pt verbalized understanding and states no concerns at this time. Pt stable at time of dc and pushed down to bobby by Manuela Schwartz , NT in wheelchair where she left in personal vehicle driven by boyfriend.

## 2021-11-12 NOTE — Discharge Summary (Addendum)
Physician Discharge Summary   Patient: Kelli Hudson MRN: [id number] DOB: 09/03/1990  Admit date:     11/04/2021  Discharge date: 11/08/2021  Discharge Physician: Hosie Poisson   PCP: [name], MD   Recommendations at discharge:  Please follow up with PCP in one week.  Please follow up with gastroenterology as scheduled.   Discharge Diagnoses: Principal Problem:   Diabetic gastroparesis associated with type 1 diabetes mellitus (HCC) Active Problems:   Intractable nausea and vomiting   Sinus tachycardia   Drug-seeking behavior   Hypertension associated with diabetes ([place])   Type 1 diabetes mellitus (HCC)   GERD without esophagitis   Gastroparesis   Generalized abdominal pain    Hospital Course:  31 year old black female DM TY 1 since 2008 Insulin pump in the past, known gastroparesis HTN, HLD, underlying chronic sinus tach prior left foot ulcer followed chronically by Dr. Havery Moros in the outpatient setting Has been referred to [facility] but has been declined at all centers Last EGD 08/27/2021 normal esophagus?  Pyloric spasm status post Botox Last admitted 8/23-8/28-received Reglan Dilaudid Robaxin and was discharged on Dilaudid Reglan Compazine pantoprazole ---had been seen in OP by Dr. Havery Moros recently to see if can get gastric pacemaker-set to see Dr. Donnamae [facility] in [place] for gastroparesis   Re-presented 9/18 nausea vomiting chest generalized abdominal pain White count 8 BUN/creatinine 18/1.22 LFTs normal lipase normal troponin negative C chestT No PE. Pt admitted to [facility] service , GI, surgery and IR on board .    Assessment and Plan: * Diabetic gastroparesis associated with type 1 diabetes mellitus ([place]) Please avoid narcotics, GI consulted.  GI recommends venting gastrostomy at this time. General surgery consulted for possible gastrostomy tube and jejunostomy feeding tube, after discussing with IR, felt the venting G tube  and GJ tube are invasive and we should pursue the gastric pacemaker at tertiary care center.  IR on board, recommends gastric pacemaker, rather a G tube or J tube.   Her symptoms have much improved . Advanced diet as tolerated.  She is being discharged on Motegrity daily outpatient, could also do Linzess 145 mcg daily to see if this helps decrease abdominal pain and nausea and vomiting.  Recently referred to gastroparesis specialist Dr. Vickki Muff at [facility] in [place] for gastric pacemaker placement, appointment has not yet been scheduled. Recommend follow up with Dr Rush Landmark.    Intractable nausea and vomiting Due to diabetic gastroparesis, non in the last 48 hours, advanced diet as tolerated.   Drug-seeking behavior Has been made clear to pt from beginning: no narcotics this admission. Concern for drug seeking behavior / developing narcotic dependence documented by other providers in chart Concern that her gastroparesis and dysmotility is further worsened by narcotic meds. This is also extensively documented by prior providers and GI specialists in chart. For more info, please see extensive discussions by prior hospitalists and GI docs during last admit (briefly noted a couple of these below). Please also see: My admission H+P from Sept 2022 Dr. Bridgett Larsson admission H+P from just last month. [facility] DC summary from Sept 2022  I have had long discussion with patient, but explained to her that multiple docs prior to me came to this decision including specialists just last admit (2-3 weeks ago) I even showed her in Epic where Dr. Bryan Lemma Physiological scientist) initial consult note during last admission on 8/25 clearly states "No role for Dilaudid" Other notes include Dr. Lyndel Safe (Gastroenterologist) 8/28 note where  he notes "Seen by Dr. Collene Mares yesterday who discussed that narcotics will delay gastric emptying" (Dr. Collene Mares also a Gastroenterologist).  Sinus tachycardia Much  improved.   GERD without esophagitis Switching protonix to IV for the moment due to intractable N/V.  Type 1 diabetes mellitus ([place]) Resume home meds.   Hypertension associated with diabetes ([place]) Resume home meds on discharge.   AKI;  Resolved.   Constipation:  Recommend to continue with Motegrity and LInzess.   Hypertensive Crisis:  BP parameters are well controlled.  Continue with coreg and prn hydralazine.      Hyperlipidemia:  Resume lipitor.      Hypomagnesemia:  Replaced.  Repeat levels wnl.   Leukocytosis: Unclear etiology.  CT angio of the chest unremarkable.  UA is negative for infection.  She remains afebrile and non toxic appearing.         Consultants: gastroenterology.  Procedures performed: none.   Disposition: Home Diet recommendation:  Discharge Diet Orders (From admission, onward)     Start     Ordered   11/08/21 0000  Diet - low sodium heart healthy        11/08/21 1101          Soft diet .  DISCHARGE MEDICATION: Allergies as of 11/08/2021   No Known Allergies      Medication List     STOP taking these medications    erythromycin base 500 MG tablet Commonly known as: E-MYCIN   prochlorperazine 10 MG tablet Commonly known as: COMPAZINE       TAKE these medications    atorvastatin 20 MG tablet Commonly known as: LIPITOR Take 1 tablet (20 mg total) by mouth daily.   carvedilol 12.5 MG tablet Commonly known as: COREG Take 1 tablet (12.5 mg total) by mouth 2 (two) times daily with a meal. What changed:  medication strength how much to take   Dexcom G6 Transmitter Misc Change every 90 days   dicyclomine 20 MG tablet Commonly known as: BENTYL Take 1 tablet (20 mg total) by mouth 3 (three) times daily as needed for spasms.   gabapentin 300 MG capsule Commonly known as: NEURONTIN Take 300 mg by mouth 3 (three) times daily.   hydrOXYzine 25 MG capsule Commonly known as: VISTARIL Take 1 capsule (25 mg total) by  mouth every 8 (eight) hours as needed. What changed: reasons to take this   insulin regular 100 units/mL injection Commonly known as: NovoLIN R Inject 0-0.15 mLs (0-15 Units total) into the skin See admin instructions. Via pump.Start this medication after you follow up with your PCP What changed: additional instructions   Levemir FlexTouch 100 UNIT/ML FlexPen Generic drug: insulin detemir Inject 16 Units into the skin daily.   linaclotide 290 MCG Caps capsule Commonly known as: LINZESS Take 1 capsule (290 mcg total) by mouth daily before breakfast.   metoCLOPramide 10 MG tablet Commonly known as: REGLAN Take 1 tablet (10 mg total) by mouth every 8 (eight) hours as needed for nausea or vomiting.   Motegrity 2 MG Tabs Generic drug: Prucalopride Succinate Take 1 tablet (2 mg total) by mouth daily.   pantoprazole 40 MG tablet Commonly known as: PROTONIX Take 1 tablet (40 mg total) by mouth 2 (two) times daily before a meal.   promethazine 12.5 MG tablet Commonly known as: PHENERGAN Take 1 tablet (12.5 mg total) by mouth every 6 (six) hours as needed for nausea or vomiting.        Discharge Exam: [employer]  11/05/21 0008  Weight: 79.2 kg   General exam: Appears calm and comfortable  Respiratory system: Clear to auscultation. Respiratory effort normal. Cardiovascular system: S1 & S2 heard, RRR. No pedal edema. Gastrointestinal system: Abdomen is nondistended, soft and nontender.  Normal bowel sounds heard. Central nervous system: Alert and oriented. No focal neurological deficits. Extremities: Symmetric 5 x 5 power. Skin: No rashes, lesions or ulcers Psychiatry:  Mood & affect appropriate.    Condition at discharge: fair  The results of significant diagnostics from this hospitalization (including imaging, microbiology, ancillary and laboratory) are listed below for reference.   Imaging Studies: CT HEAD WO CONTRAST (5MM)  Result Date: 11/06/2021 CLINICAL DATA:   Altered level of consciousness EXAM: CT HEAD WITHOUT CONTRAST TECHNIQUE: Contiguous axial images were obtained from the base of the skull through the vertex without intravenous contrast. RADIATION DOSE REDUCTION: This exam was performed according to the departmental dose-optimization program which includes automated exposure control, adjustment of the mA and/or kV according to patient size and/or use of iterative reconstruction technique. COMPARISON:  None Available. FINDINGS: Brain: No acute infarct or hemorrhage. Lateral ventricles and midline structures are unremarkable. No acute extra-axial fluid collections. No mass effect. Vascular: No hyperdense vessel or unexpected calcification. Skull: Normal. Negative for fracture or focal lesion. Sinuses/Orbits: No acute finding. Other: None. IMPRESSION: 1. No acute intracranial process. Electronically Signed   By: Randa Ngo M.D.   On: 11/06/2021 17:57   CT Angio Chest Pulmonary Embolism (PE) W or WO Contrast  Result Date: 11/05/2021 CLINICAL DATA:  Pulmonary embolism suspected. Positive D-dimer. 31 year old female. EXAM: CT ANGIOGRAPHY CHEST WITH CONTRAST TECHNIQUE: Multidetector CT imaging of the chest was performed using the standard protocol during bolus administration of intravenous contrast. Multiplanar CT image reconstructions and MIPs were obtained to evaluate the vascular anatomy. RADIATION DOSE REDUCTION: This exam was performed according to the departmental dose-optimization program which includes automated exposure control, adjustment of the mA and/or kV according to patient size and/or use of iterative reconstruction technique. CONTRAST:  91mL OMNIPAQUE IOHEXOL 350 MG/ML SOLN COMPARISON:  Portable chest today, PA Lat chest 11/04/2021, CTA chest 11/16/2020, CTA chest 08/22/2016. FINDINGS: Cardiovascular: The cardiac size is normal. There is no pericardial effusion. The pulmonary arteries and veins are normal caliber. No arterial embolus is seen,  although please note the subsegmental arterial bed in the lower lung fields is obscured due to breathing motion. The aorta and great vessels are normal. There is no visible coronary artery calcification. Mediastinum/Nodes: No enlarged mediastinal, hilar, or axillary lymph nodes. Thyroid gland, trachea, and esophagus demonstrate no significant findings. There is a small hiatal hernia. 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 Mild chronic elevation right hemidiaphragm. Lungs/Pleura: Lungs are clear. No pleural effusion or pneumothorax. Upper Abdomen: No acute abnormality. Musculoskeletal: No chest wall abnormality. No acute or significant osseous findings. Review of the MIP images confirms the above findings. IMPRESSION: No acute chest CT or CTA findings, but with nonvisualization of the lower zonal subsegmental arteries due to breathing motion. Electronically Signed   By: Telford Nab M.D.   On: 11/05/2021 05:45   DG CHEST PORT 1 VIEW  Result Date: 11/05/2021 CLINICAL DATA:  Increasing shortness of breath EXAM: PORTABLE CHEST 1 VIEW COMPARISON:  11/04/2021 FINDINGS: The heart size and mediastinal contours are within normal limits. Both lungs are clear. The visualized skeletal structures are unremarkable. IMPRESSION: No active disease. Electronically Signed   By: Inez Catalina M.D.   On: 11/05/2021 02:38   DG Chest 2  View  Result Date: 11/04/2021 CLINICAL DATA:  Shortness of breath, chest pain. EXAM: CHEST - 2 VIEW COMPARISON:  October 09, 2021. FINDINGS: The heart size and mediastinal contours are within normal limits. Both lungs are clear. The visualized skeletal structures are unremarkable. IMPRESSION: No active cardiopulmonary disease. Electronically Signed   By: Marijo Conception M.D.   On: 11/04/2021 17:47    Microbiology: Results for orders placed or performed during the hospital encounter of 10/04/21  Culture, blood (routine x 2)     Status: None   Collection Time: 10/04/21 12:27 PM    Specimen: BLOOD  Result Value Ref Range Status   Specimen Description   Final    BLOOD BLOOD RIGHT ARM Performed at [facility] [address], [place], [place] [address]    Special Requests   Final    BOTTLES DRAWN AEROBIC AND ANAEROBIC Blood Culture results may not be optimal due to an excessive volume of blood received in culture bottles Performed at [facility] [address], [place], [place] [address]    Culture   Final    NO GROWTH 5 DAYS Performed at [facility], [place] [address], [place], [place] [address]    Report Status 10/09/2021 FINAL  Final  Culture, blood (routine x 2)     Status: Abnormal   Collection Time: 10/05/21  5:19 AM   Specimen: BLOOD  Result Value Ref Range Status   Specimen Description   Final    BLOOD BLOOD LEFT HAND Performed at [facility] [address], [place], [place] [address]    Special Requests   Final    BOTTLES DRAWN AEROBIC ONLY Blood Culture adequate volume Performed at [facility] [address], [place], [place] [address]    Culture  Setup Time   Final    GRAM POSITIVE RODS AEROBIC BOTTLE ONLY CRITICAL RESULT CALLED TO, READ BACK BY AND VERIFIED WITH: PHARMD M.STWYNE AT 0818 ON 10/07/2021 BY T.SAAD.    Culture (A)  Final    DIPHTHEROIDS(CORYNEBACTERIUM SPECIES) Standardized susceptibility testing for this organism is not available. Performed at [facility], [place] [address] [place], [place] [address]    Report Status 10/08/2021 FINAL  Final    Labs: CBC: Recent Labs  Lab 11/06/21 0415 11/06/21 2000 11/08/21 0422  WBC 14.0* 12.0* 7.1  NEUTROABS 8.5* 8.1*  --   HGB 9.5* 10.2* 9.7*  HCT 29.3* 29.4* 31.2*  MCV 90.2 86.2 93.1  PLT 378 375 [id number]   Basic Metabolic Panel: Recent Labs  Lab 11/06/21 0415 11/06/21 2000 11/07/21 0447 11/08/21 0422  NA 143 140 141 140  K 3.6 4.7 3.6 4.6  CL 110 110 110 110  CO2 25 22 27 26   GLUCOSE  112* 131* 76 146*  BUN 22* 20 21* 13  CREATININE 1.47* 1.32* 1.18* 0.90  CALCIUM 9.3 9.1 9.3 9.1  MG 1.5*  --  2.2 1.9   Liver Function Tests: Recent Labs  Lab 11/06/21 0415  AST 23  ALT 13  ALKPHOS 58  BILITOT 0.9  PROT 5.5*  ALBUMIN 2.6*   CBG: Recent Labs  Lab 11/08/21 0025 11/08/21 0115 11/08/21 0403 11/08/21 0734 11/08/21 1149  GLUCAP 62* 108* 136* 137* 194*    Discharge time spent: 39 minutes.   Signed: Hosie Poisson, MD [facility] 11/12/2021

## 2021-11-18 ENCOUNTER — Telehealth: Payer: Self-pay

## 2021-11-18 NOTE — Telephone Encounter (Signed)
Received a fax from [facility] regarding referral. Fax stated that they tried to reach patient by phone and her vm has been full each time.  I called patient to inform her that [facility] has been trying to reach her to set up her appt. Pt states that [place] was [place] so that is probably why they were not able to reach her. I provided patient with the phone number on the fax cover sheet [phone]). I advised patient to contact [facility] to set up her appt. Pt verbalized understanding and had no concerns at the end of the call.

## 2021-11-19 ENCOUNTER — Telehealth: Payer: Self-pay | Admitting: *Deleted

## 2021-11-19 NOTE — Chronic Care Management (AMB) (Signed)
  Care Coordination  Outreach Note  11/19/2021 Name: Kelli Hudson MRN: [id number] DOB: [birth year]   Care Coordination Outreach Attempts: An unsuccessful telephone outreach was attempted today to offer the patient information about available care coordination services as a benefit of their health plan.   Rescheduling   Follow Up Plan:  Additional outreach attempts will be made to offer the patient care coordination information and services.   Encounter Outcome:  No Answer  Julian Hy, [place] Direct Dial: [phone]

## 2021-11-25 ENCOUNTER — Ambulatory Visit: Payer: 59 | Admitting: Family Medicine

## 2021-11-29 ENCOUNTER — Ambulatory Visit (INDEPENDENT_AMBULATORY_CARE_PROVIDER_SITE_OTHER): Payer: 59 | Admitting: Family Medicine

## 2021-11-29 ENCOUNTER — Other Ambulatory Visit: Payer: Self-pay

## 2021-11-29 ENCOUNTER — Encounter: Payer: Self-pay | Admitting: Internal Medicine

## 2021-11-29 ENCOUNTER — Encounter: Payer: Self-pay | Admitting: Family Medicine

## 2021-11-29 VITALS — BP 110/68 | HR 102 | Temp 97.9°F | Ht 69.0 in | Wt 189.6 lb

## 2021-11-29 DIAGNOSIS — E1021 Type 1 diabetes mellitus with diabetic nephropathy: Secondary | ICD-10-CM

## 2021-11-29 DIAGNOSIS — E1065 Type 1 diabetes mellitus with hyperglycemia: Secondary | ICD-10-CM

## 2021-11-29 DIAGNOSIS — E1159 Type 2 diabetes mellitus with other circulatory complications: Secondary | ICD-10-CM

## 2021-11-29 DIAGNOSIS — I152 Hypertension secondary to endocrine disorders: Secondary | ICD-10-CM

## 2021-11-29 DIAGNOSIS — E1042 Type 1 diabetes mellitus with diabetic polyneuropathy: Secondary | ICD-10-CM | POA: Diagnosis not present

## 2021-11-29 DIAGNOSIS — K3184 Gastroparesis: Secondary | ICD-10-CM

## 2021-11-29 DIAGNOSIS — I4581 Long QT syndrome: Secondary | ICD-10-CM

## 2021-11-29 LAB — MICROALBUMIN / CREATININE URINE RATIO
Creatinine,U: 174.3 mg/dL
Microalb Creat Ratio: 53.8 mg/g — ABNORMAL HIGH (ref 0.0–30.0)
Microalb, Ur: 93.7 mg/dL — ABNORMAL HIGH (ref 0.0–1.9)

## 2021-11-29 MED ORDER — DEXCOM G6 TRANSMITTER MISC: 3 refills | Status: DC

## 2021-11-29 NOTE — Progress Notes (Signed)
[facility] [address] [place] [address] Dept: [phone] Dept Fax: [phone]  Chronic Care Office Visit  Subjective:    Patient ID: Kelli Hudson, female    DOB: [birth year], 31 y.o..   MRN: [id number]  Chief Complaint  Patient presents with   Follow-up    3 month f/u and needs EKG.       History of Present Illness:  Patient is in today for reassessment of chronic medical issues.  Kelli Hudson has a history of Type 1 diabetes complicated with gastroparesis, retinopathy, neuropathy, and nephropathy. She is currently on insulin detemir 16 units daily and a sliding scale of regular insulin. She is using a CGM. She is managed by Dr. Lonzo Cloud for her diabetes. Kelli Hudson notes it has been some time now since she last saw her.   Gastroparesis- Last hospitalization was in mid Sept. Kelli Hudson has had 14 admissions for this since Jan.  She notes her stomach is doing okay at this point. She is seeing Dr. Retia Passe. He has referred her to a specialist in [place] to look at getting a gastric pacemaker. She was recently started on prucalopride succinate (Motegrity). Treatment with certain meds has been limited due to issues with a long QT interval. [name] has requested that we do an EKG to reassess this off of some of her medications.  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) 11/09/2020   Diabetic retinopathy of both eyes associated  with type 1 diabetes mellitus (HCC) 09/24/2020   Diabetic gastroparesis associated with type 1 diabetes mellitus (HCC) 08/12/2020   Generalized abdominal pain 07/23/2020   Diabetic nephropathy associated with type 1 diabetes mellitus (HCC) 06/27/2020   HLD (hyperlipidemia) 05/23/2020   Sinus tachycardia 05/09/2020   Depression with anxiety 07/05/2019   Diabetic polyneuropathy associated with type 1 diabetes mellitus (HCC) 09/24/2017   Gastroparesis    Goiter 10/05/2009   Hypertension associated with diabetes (HCC) 10/05/2009   Past Surgical History:  Procedure Laterality Date   AMPUTATION TOE Left 03/10/2018   Procedure: AMPUTATION FIFTH TOE;  Surgeon: Vivi Barrack, DPM;  Location: [facility];  Service: Podiatry;  Laterality: Left;   BIOPSY  01/28/2020   Procedure: BIOPSY;  Surgeon: Kathi Der, MD;  Location: [facility];  Service: Gastroenterology;;   BOTOX INJECTION  08/26/2021   Procedure: BOTOX INJECTION;  Surgeon: Sherrilyn Rist, MD;  Location: [facility];  Service: Gastroenterology;;   ESOPHAGOGASTRODUODENOSCOPY N/A 01/28/2020   Procedure: ESOPHAGOGASTRODUODENOSCOPY (EGD);  Surgeon: Kathi Der, MD;  Location: [facility];  Service: Gastroenterology;  Laterality: N/A;   ESOPHAGOGASTRODUODENOSCOPY (EGD) WITH PROPOFOL Left 09/08/2015   Procedure: ESOPHAGOGASTRODUODENOSCOPY (EGD) WITH PROPOFOL;  Surgeon: Willis Modena, MD;  [facility];  Service: Endoscopy;  Laterality: Left;   ESOPHAGOGASTRODUODENOSCOPY (EGD) WITH PROPOFOL N/A 08/26/2021   Procedure: ESOPHAGOGASTRODUODENOSCOPY (EGD) WITH PROPOFOL;  Surgeon: Sherrilyn Rist, MD;  Location: [facility];  Service: Gastroenterology;  Laterality: N/A;   WISDOM TOOTH EXTRACTION     Family  History  Problem Relation Age of Onset   Lung cancer Mother    Diabetes Mother    Bipolar disorder Father    Liver cancer Maternal Grandfather    Diabetes Maternal Aunt        x 2   Pancreatic  cancer Maternal Uncle    Prostate cancer Maternal Uncle    Breast cancer Other        maternal great aunt   Heart disease Other    Ovarian cancer Other        maternal great aunt   Kidney disease Other        maternal great aunt   Colon cancer Neg Hx    Stomach cancer Neg Hx    Outpatient Medications Prior to Visit  Medication Sig Dispense Refill   atorvastatin (LIPITOR) 20 MG tablet Take 1 tablet (20 mg total) by mouth daily. 90 tablet 2   carvedilol (COREG) 12.5 MG tablet Take 1 tablet (12.5 mg total) by mouth 2 (two) times daily with a meal. 60 tablet 2   Continuous Blood Gluc Transmit (DEXCOM G6 TRANSMITTER) MISC Change every 90 days 1 each 3   dicyclomine (BENTYL) 20 MG tablet Take 1 tablet (20 mg total) by mouth 3 (three) times daily as needed for spasms. 30 tablet 0   gabapentin (NEURONTIN) 300 MG capsule Take 300 mg by mouth 3 (three) times daily.     hydrOXYzine (VISTARIL) 25 MG capsule Take 1 capsule (25 mg total) by mouth every 8 (eight) hours as needed. (Patient taking differently: Take 25 mg by mouth every 8 (eight) hours as needed for anxiety.) 30 capsule 0   insulin detemir (LEVEMIR FLEXTOUCH) 100 UNIT/ML FlexPen Inject 16 Units into the skin daily. 10 mL 0   insulin regular (NOVOLIN R) 100 units/mL injection Inject 0-0.15 mLs (0-15 Units total) into the skin See admin instructions. Via pump.Start this medication after you follow up with your PCP (Patient taking differently: Inject 0-15 Units into the skin See admin instructions. Using sliding scale) 30 mL 1   linaclotide (LINZESS) 290 MCG CAPS capsule Take 1 capsule (290 mcg total) by mouth daily before breakfast. 30 capsule 1   metoCLOPramide (REGLAN) 10 MG tablet Take 1 tablet (10 mg total) by mouth every 8 (eight) hours as needed for nausea or vomiting. 30 tablet 0   pantoprazole (PROTONIX) 40 MG tablet Take 1 tablet (40 mg total) by mouth 2 (two) times daily before a meal. 60 tablet 5   promethazine (PHENERGAN) 12.5 MG  tablet Take 1 tablet (12.5 mg total) by mouth every 6 (six) hours as needed for nausea or vomiting. 30 tablet 0   Prucalopride Succinate (MOTEGRITY) 2 MG TABS Take 1 tablet (2 mg total) by mouth daily. 30 tablet 0   No facility-administered medications prior to visit.   No Known Allergies    Objective:   Today's Vitals   11/29/21 1258  BP: 110/68  Pulse: (!) 102  Temp: 97.9 F (36.6 C)  TempSrc: Temporal  SpO2: 99%  Weight: 189 lb 9.6 oz (86 kg)  Height: 5\' 9"  (1.753 m)   Body mass index is 28 kg/m.   General: Well developed, well nourished. No acute distress. Feet- Skin intact. There are a few small superficial lesions with a slight scaly cap. No sign of   maceration between toes. The left 3rd-5th toes are missing. Nails are normal. Dorsalis pedis and   posterior tibial artery pulses are normal. Scarring noted in area on the  left foot where she had a prior   dorsal ulcer. Insensate to 5.07 monofilament testing. Psych: Alert and oriented. Normal mood and affect.  Health Maintenance Due  Topic Date Due   HPV VACCINES (3 - 3-dose series) 11/16/2009   Diabetic kidney evaluation - Urine ACR  05/22/2021   EKG: Normal sinus rhythm (rate= 100). Qtc= 386, which is normal    Assessment & Plan:   1. Gastroparesis Working with Dr. Havery Moros with plans to be seen in [place] on 10/23 to consider a gastric pacemaker.  2. Long Q-T syndrome Appears resolved currently.  - EKG 12-Lead  3. Type 1 diabetes mellitus with hyperglycemia (HCC) I strongly recommended she follow up with Dr. Kelton Pillar.  Continue insulin detemir 16 units daily and sliding scale with regular insulin.  - Microalbumin / creatinine urine ratio  4. Diabetic polyneuropathy associated with type 1 diabetes mellitus ([place]) Remains at high risk for further foot infections. I reinforced routine foot care at home.  5. Hypertension associated with diabetes ([place]) Blood pressure is normal today.  6. Prolonged Q-T  interval on ECG  7. Diabetic nephropathy associated with type 1 diabetes mellitus ([place]) I will check her micral today.  - Microalbumin / creatinine urine ratio   Return in about 3 months (around 03/01/2022) for Reassessment.   [name], MD

## 2021-12-01 ENCOUNTER — Encounter (HOSPITAL_COMMUNITY): Payer: Self-pay

## 2021-12-01 ENCOUNTER — Other Ambulatory Visit: Payer: Self-pay

## 2021-12-01 ENCOUNTER — Inpatient Hospital Stay (HOSPITAL_COMMUNITY)
Admission: EM | Admit: 2021-12-01 | Discharge: 2021-12-05 | DRG: 074 | Disposition: A | Discharge status: Home / Self Care | Payer: 59 | Attending: Internal Medicine | Admitting: Internal Medicine

## 2021-12-01 DIAGNOSIS — Z833 Family history of diabetes mellitus: Secondary | ICD-10-CM

## 2021-12-01 DIAGNOSIS — N179 Acute kidney failure, unspecified: Secondary | ICD-10-CM | POA: Diagnosis not present

## 2021-12-01 DIAGNOSIS — I10 Essential (primary) hypertension: Secondary | ICD-10-CM | POA: Diagnosis present

## 2021-12-01 DIAGNOSIS — D649 Anemia, unspecified: Secondary | ICD-10-CM | POA: Diagnosis present

## 2021-12-01 DIAGNOSIS — Z79899 Other long term (current) drug therapy: Secondary | ICD-10-CM

## 2021-12-01 DIAGNOSIS — K3184 Gastroparesis: Secondary | ICD-10-CM | POA: Diagnosis present

## 2021-12-01 DIAGNOSIS — E1043 Type 1 diabetes mellitus with diabetic autonomic (poly)neuropathy: Secondary | ICD-10-CM | POA: Diagnosis not present

## 2021-12-01 DIAGNOSIS — E785 Hyperlipidemia, unspecified: Secondary | ICD-10-CM | POA: Diagnosis present

## 2021-12-01 DIAGNOSIS — K219 Gastro-esophageal reflux disease without esophagitis: Secondary | ICD-10-CM | POA: Diagnosis present

## 2021-12-01 DIAGNOSIS — R112 Nausea with vomiting, unspecified: Secondary | ICD-10-CM | POA: Diagnosis not present

## 2021-12-01 DIAGNOSIS — Z794 Long term (current) use of insulin: Secondary | ICD-10-CM

## 2021-12-01 DIAGNOSIS — R1084 Generalized abdominal pain: Secondary | ICD-10-CM | POA: Diagnosis not present

## 2021-12-01 DIAGNOSIS — E1165 Type 2 diabetes mellitus with hyperglycemia: Secondary | ICD-10-CM | POA: Diagnosis not present

## 2021-12-01 DIAGNOSIS — R739 Hyperglycemia, unspecified: Secondary | ICD-10-CM

## 2021-12-01 DIAGNOSIS — E0590 Thyrotoxicosis, unspecified without thyrotoxic crisis or storm: Secondary | ICD-10-CM | POA: Diagnosis present

## 2021-12-01 DIAGNOSIS — Z89422 Acquired absence of other left toe(s): Secondary | ICD-10-CM

## 2021-12-01 DIAGNOSIS — E109 Type 1 diabetes mellitus without complications: Secondary | ICD-10-CM

## 2021-12-01 DIAGNOSIS — E1065 Type 1 diabetes mellitus with hyperglycemia: Secondary | ICD-10-CM | POA: Diagnosis present

## 2021-12-01 DIAGNOSIS — E875 Hyperkalemia: Secondary | ICD-10-CM | POA: Diagnosis not present

## 2021-12-01 DIAGNOSIS — E039 Hypothyroidism, unspecified: Secondary | ICD-10-CM | POA: Diagnosis present

## 2021-12-01 DIAGNOSIS — E1143 Type 2 diabetes mellitus with diabetic autonomic (poly)neuropathy: Secondary | ICD-10-CM | POA: Diagnosis present

## 2021-12-01 LAB — CBG MONITORING, ED: Glucose-Capillary: 271 mg/dL — ABNORMAL HIGH (ref 70–99)

## 2021-12-01 NOTE — ED Triage Notes (Signed)
Pt reports with abdominal pain since this morning. Pt states that she has gastroparesis.

## 2021-12-01 NOTE — ED Provider Triage Note (Signed)
Emergency Medicine Provider Triage Evaluation Note  Kelli Hudson , a 31 y.o. female  was evaluated in triage.  Pt complains of abdominal pain, nausea and vomiting that started this morning.  Feels like her gastroparesis.  Also says her blood sugar has been running in the 300s  Review of Systems  Positive:  Negative:   Physical Exam  BP (!) 187/128 (BP Location: Right Arm)   Pulse (!) 116   Temp 99.9 F (37.7 C) (Oral)   Resp 20   SpO2 99%  Gen:   Awake, no distress   Resp:  Normal effort  MSK:   Moves extremities without difficulty  Other:  Actively vomiting in triage  Medical Decision Making  Medically screening exam initiated at 11:20 PM.  Appropriate orders placed.  Kelli Hudson was informed that the remainder of the evaluation will be completed by another provider, this initial triage assessment does not replace that evaluation, and the importance of remaining in the ED until their evaluation is complete.   Declined ODT Zofran from triage   Rhae Hammock, PA-C 12/01/21 2321

## 2021-12-01 NOTE — ED Notes (Signed)
unsuccessful lab draw x2

## 2021-12-02 ENCOUNTER — Encounter (HOSPITAL_COMMUNITY): Payer: Self-pay | Admitting: Student

## 2021-12-02 ENCOUNTER — Telehealth: Payer: Self-pay

## 2021-12-02 DIAGNOSIS — I10 Essential (primary) hypertension: Secondary | ICD-10-CM | POA: Diagnosis not present

## 2021-12-02 DIAGNOSIS — E875 Hyperkalemia: Secondary | ICD-10-CM | POA: Diagnosis not present

## 2021-12-02 DIAGNOSIS — K219 Gastro-esophageal reflux disease without esophagitis: Secondary | ICD-10-CM | POA: Diagnosis not present

## 2021-12-02 DIAGNOSIS — Z89422 Acquired absence of other left toe(s): Secondary | ICD-10-CM | POA: Diagnosis not present

## 2021-12-02 DIAGNOSIS — Z79899 Other long term (current) drug therapy: Secondary | ICD-10-CM | POA: Diagnosis not present

## 2021-12-02 DIAGNOSIS — E1043 Type 1 diabetes mellitus with diabetic autonomic (poly)neuropathy: Secondary | ICD-10-CM | POA: Diagnosis not present

## 2021-12-02 DIAGNOSIS — E0590 Thyrotoxicosis, unspecified without thyrotoxic crisis or storm: Secondary | ICD-10-CM | POA: Diagnosis not present

## 2021-12-02 DIAGNOSIS — Z833 Family history of diabetes mellitus: Secondary | ICD-10-CM | POA: Diagnosis not present

## 2021-12-02 DIAGNOSIS — N179 Acute kidney failure, unspecified: Secondary | ICD-10-CM | POA: Diagnosis not present

## 2021-12-02 DIAGNOSIS — D649 Anemia, unspecified: Secondary | ICD-10-CM | POA: Diagnosis not present

## 2021-12-02 DIAGNOSIS — E1065 Type 1 diabetes mellitus with hyperglycemia: Secondary | ICD-10-CM | POA: Diagnosis not present

## 2021-12-02 DIAGNOSIS — E039 Hypothyroidism, unspecified: Secondary | ICD-10-CM | POA: Diagnosis not present

## 2021-12-02 DIAGNOSIS — E785 Hyperlipidemia, unspecified: Secondary | ICD-10-CM | POA: Diagnosis not present

## 2021-12-02 DIAGNOSIS — K3184 Gastroparesis: Secondary | ICD-10-CM

## 2021-12-02 DIAGNOSIS — Z794 Long term (current) use of insulin: Secondary | ICD-10-CM | POA: Diagnosis not present

## 2021-12-02 LAB — GLUCOSE, CAPILLARY
Glucose-Capillary: 177 mg/dL — ABNORMAL HIGH (ref 70–99)
Glucose-Capillary: 134 mg/dL — ABNORMAL HIGH (ref 70–99)

## 2021-12-02 LAB — URINALYSIS, ROUTINE W REFLEX MICROSCOPIC
Bacteria, UA: NONE SEEN
Bilirubin Urine: NEGATIVE
Glucose, UA: >=500 mg/dL — AB
Hgb urine dipstick: NEGATIVE
Ketones, ur: 5 mg/dL — AB
Leukocytes,Ua: NEGATIVE
Nitrite: NEGATIVE
Protein, ur: >=300 mg/dL — AB
Specific Gravity, Urine: 1.017 (ref 1.005–1.030)
pH: 5 (ref 5.0–8.0)

## 2021-12-02 LAB — CBC
HCT: 33.4 % — ABNORMAL LOW (ref 36.0–46.0)
Hemoglobin: 11 g/dL — ABNORMAL LOW (ref 12.0–15.0)
MCH: 29.4 pg (ref 26.0–34.0)
MCHC: 32.9 g/dL (ref 30.0–36.0)
MCV: 89.3 fL (ref 80.0–100.0)
Platelets: 455 10*3/uL — ABNORMAL HIGH (ref 150–400)
RBC: 3.74 MIL/uL — ABNORMAL LOW (ref 3.87–5.11)
RDW: 13.5 % (ref 11.5–15.5)
WBC: 8.8 10*3/uL (ref 4.0–10.5)
nRBC: 0 % (ref 0.0–0.2)

## 2021-12-02 LAB — COMPREHENSIVE METABOLIC PANEL
ALT: 23 U/L (ref 0–44)
ALT: 20 U/L (ref 0–44)
AST: 21 U/L (ref 15–41)
AST: 20 U/L (ref 15–41)
Albumin: 3.5 g/dL (ref 3.5–5.0)
Albumin: 2.8 g/dL — ABNORMAL LOW (ref 3.5–5.0)
Alkaline Phosphatase: 94 U/L (ref 38–126)
Alkaline Phosphatase: 78 U/L (ref 38–126)
Anion gap: 8 (ref 5–15)
Anion gap: 8 (ref 5–15)
BUN: 25 mg/dL — ABNORMAL HIGH (ref 6–20)
BUN: 24 mg/dL — ABNORMAL HIGH (ref 6–20)
CO2: 25 mmol/L (ref 22–32)
CO2: 24 mmol/L (ref 22–32)
Calcium: 9.6 mg/dL (ref 8.9–10.3)
Calcium: 8.8 mg/dL — ABNORMAL LOW (ref 8.9–10.3)
Chloride: 103 mmol/L (ref 98–111)
Chloride: 104 mmol/L (ref 98–111)
Creatinine, Ser: 1.11 mg/dL — ABNORMAL HIGH (ref 0.44–1.00)
Creatinine, Ser: 1.65 mg/dL — ABNORMAL HIGH (ref 0.44–1.00)
GFR, Estimated: >60 mL/min (ref >60)
GFR, Estimated: 43 mL/min — ABNORMAL LOW (ref >60)
Glucose, Bld: 324 mg/dL — ABNORMAL HIGH (ref 70–99)
Glucose, Bld: 346 mg/dL — ABNORMAL HIGH (ref 70–99)
Potassium: 3.7 mmol/L (ref 3.5–5.1)
Potassium: 4.6 mmol/L (ref 3.5–5.1)
Sodium: 136 mmol/L (ref 135–145)
Sodium: 136 mmol/L (ref 135–145)
Total Bilirubin: 1.1 mg/dL (ref 0.3–1.2)
Total Bilirubin: 1 mg/dL (ref 0.3–1.2)
Total Protein: 7.5 g/dL (ref 6.5–8.1)
Total Protein: 6.2 g/dL — ABNORMAL LOW (ref 6.5–8.1)

## 2021-12-02 LAB — CBC WITH DIFFERENTIAL/PLATELET
Abs Immature Granulocytes: 0.03 10*3/uL (ref 0.00–0.07)
Basophils Absolute: 0 10*3/uL (ref 0.0–0.1)
Basophils Relative: 0 %
Eosinophils Absolute: 0 10*3/uL (ref 0.0–0.5)
Eosinophils Relative: 0 %
HCT: 31.5 % — ABNORMAL LOW (ref 36.0–46.0)
Hemoglobin: 10 g/dL — ABNORMAL LOW (ref 12.0–15.0)
Immature Granulocytes: 0 %
Lymphocytes Relative: 15 %
Lymphs Abs: 1.3 10*3/uL (ref 0.7–4.0)
MCH: 28.9 pg (ref 26.0–34.0)
MCHC: 31.7 g/dL (ref 30.0–36.0)
MCV: 91 fL (ref 80.0–100.0)
Monocytes Absolute: 0.5 10*3/uL (ref 0.1–1.0)
Monocytes Relative: 6 %
Neutro Abs: 7.1 10*3/uL (ref 1.7–7.7)
Neutrophils Relative %: 79 %
Platelets: 372 10*3/uL (ref 150–400)
RBC: 3.46 MIL/uL — ABNORMAL LOW (ref 3.87–5.11)
RDW: 13.8 % (ref 11.5–15.5)
WBC: 9 10*3/uL (ref 4.0–10.5)
nRBC: 0 % (ref 0.0–0.2)

## 2021-12-02 LAB — CBG MONITORING, ED
Glucose-Capillary: 286 mg/dL — ABNORMAL HIGH (ref 70–99)
Glucose-Capillary: 293 mg/dL — ABNORMAL HIGH (ref 70–99)

## 2021-12-02 LAB — I-STAT BETA HCG BLOOD, ED (MC, WL, AP ONLY): I-stat hCG, quantitative: <5 m[IU]/mL (ref <5)

## 2021-12-02 LAB — LIPASE, BLOOD: Lipase: 40 U/L (ref 11–51)

## 2021-12-02 MED ORDER — SODIUM CHLORIDE 0.9 % IV BOLUS
2000.0000 mL | Freq: Once | INTRAVENOUS | Status: AC
  Administered 2021-12-02: 2000 mL via INTRAVENOUS

## 2021-12-02 MED ORDER — OXYCODONE HCL 5 MG PO TABS
5.0000 mg | ORAL_TABLET | ORAL | Status: DC | PRN
  Administered 2021-12-02 – 2021-12-05 (×4): 5 mg via ORAL
  Filled 2021-12-02 (×4): qty 1

## 2021-12-02 MED ORDER — HYDROMORPHONE HCL 1 MG/ML IJ SOLN
1.0000 mg | Freq: Once | INTRAMUSCULAR | Status: AC
  Administered 2021-12-02: 1 mg via INTRAVENOUS
  Filled 2021-12-02: qty 1

## 2021-12-02 MED ORDER — HYDROMORPHONE HCL 1 MG/ML IJ SOLN
1.0000 mg | INTRAMUSCULAR | Status: DC | PRN
  Administered 2021-12-02 – 2021-12-04 (×10): 1 mg via INTRAVENOUS
  Filled 2021-12-02 (×10): qty 1

## 2021-12-02 MED ORDER — DICYCLOMINE HCL 20 MG PO TABS
20.0000 mg | ORAL_TABLET | Freq: Three times a day (TID) | ORAL | Status: DC
  Administered 2021-12-02 – 2021-12-05 (×9): 20 mg via ORAL
  Filled 2021-12-02 (×9): qty 1

## 2021-12-02 MED ORDER — LORAZEPAM 2 MG/ML IJ SOLN: 1.0000 mg | Freq: Four times a day (QID) | INTRAMUSCULAR | Status: DC | PRN

## 2021-12-02 MED ORDER — INSULIN ASPART 100 UNIT/ML IJ SOLN
0.0000 [IU] | Freq: Every day | INTRAMUSCULAR | Status: DC
  Administered 2021-12-04: 2 [IU] via SUBCUTANEOUS
  Filled 2021-12-02: qty 0.05

## 2021-12-02 MED ORDER — ORAL CARE MOUTH RINSE: 15.0000 mL | OROMUCOSAL | Status: DC | PRN

## 2021-12-02 MED ORDER — CARVEDILOL 12.5 MG PO TABS
12.5000 mg | ORAL_TABLET | Freq: Two times a day (BID) | ORAL | Status: DC
  Administered 2021-12-02 – 2021-12-05 (×6): 12.5 mg via ORAL
  Filled 2021-12-02 (×7): qty 1

## 2021-12-02 MED ORDER — PROCHLORPERAZINE EDISYLATE 10 MG/2ML IJ SOLN: 10.0000 mg | Freq: Four times a day (QID) | INTRAMUSCULAR | Status: DC | PRN

## 2021-12-02 MED ORDER — LABETALOL HCL 5 MG/ML IV SOLN
10.0000 mg | Freq: Once | INTRAVENOUS | Status: AC
  Administered 2021-12-02: 10 mg via INTRAVENOUS
  Filled 2021-12-02: qty 4

## 2021-12-02 MED ORDER — PROCHLORPERAZINE EDISYLATE 10 MG/2ML IJ SOLN
10.0000 mg | Freq: Once | INTRAMUSCULAR | Status: AC
  Administered 2021-12-02: 10 mg via INTRAVENOUS
  Filled 2021-12-02: qty 2

## 2021-12-02 MED ORDER — PANTOPRAZOLE SODIUM 40 MG IV SOLR
40.0000 mg | INTRAVENOUS | Status: DC
  Administered 2021-12-02 – 2021-12-03 (×2): 40 mg via INTRAVENOUS
  Filled 2021-12-02 (×2): qty 10

## 2021-12-02 MED ORDER — FAMOTIDINE IN NACL 20-0.9 MG/50ML-% IV SOLN
20.0000 mg | Freq: Once | INTRAVENOUS | Status: AC
  Administered 2021-12-02: 20 mg via INTRAVENOUS
  Filled 2021-12-02: qty 50

## 2021-12-02 MED ORDER — CARVEDILOL 12.5 MG PO TABS: 12.5000 mg | ORAL_TABLET | Freq: Two times a day (BID) | ORAL | Status: DC

## 2021-12-02 MED ORDER — INSULIN GLARGINE-YFGN 100 UNIT/ML ~~LOC~~ SOLN
16.0000 [IU] | Freq: Every day | SUBCUTANEOUS | Status: DC
  Administered 2021-12-02 – 2021-12-05 (×4): 16 [IU] via SUBCUTANEOUS
  Filled 2021-12-02 (×5): qty 0.16

## 2021-12-02 MED ORDER — LABETALOL HCL 5 MG/ML IV SOLN
20.0000 mg | Freq: Once | INTRAVENOUS | Status: AC
  Administered 2021-12-02: 20 mg via INTRAVENOUS
  Filled 2021-12-02: qty 4

## 2021-12-02 MED ORDER — SODIUM CHLORIDE 0.9 % IV SOLN: INTRAVENOUS | Status: DC

## 2021-12-02 MED ORDER — METOCLOPRAMIDE HCL 5 MG/ML IJ SOLN
10.0000 mg | Freq: Once | INTRAMUSCULAR | Status: AC
  Administered 2021-12-02: 10 mg via INTRAVENOUS
  Filled 2021-12-02: qty 2

## 2021-12-02 MED ORDER — INSULIN ASPART 100 UNIT/ML IJ SOLN
0.0000 [IU] | Freq: Three times a day (TID) | INTRAMUSCULAR | Status: DC
  Administered 2021-12-02: 8 [IU] via SUBCUTANEOUS
  Administered 2021-12-02: 3 [IU] via SUBCUTANEOUS
  Administered 2021-12-03: 8 [IU] via SUBCUTANEOUS
  Administered 2021-12-03 – 2021-12-04 (×5): 3 [IU] via SUBCUTANEOUS
  Administered 2021-12-05: 5 [IU] via SUBCUTANEOUS
  Filled 2021-12-02: qty 0.15

## 2021-12-02 MED ORDER — ENOXAPARIN SODIUM 40 MG/0.4ML IJ SOSY
40.0000 mg | PREFILLED_SYRINGE | INTRAMUSCULAR | Status: DC
  Administered 2021-12-02 – 2021-12-04 (×3): 40 mg via SUBCUTANEOUS
  Filled 2021-12-02 (×3): qty 0.4

## 2021-12-02 NOTE — Telephone Encounter (Signed)
Sorry to hear this. The motegrity was not for constipation, it was to be used off label for gastroparesis. Inpatient team can assist if needed during her hospital stay. I think she may need a venting PEG tube given recurrent hospitalizations.

## 2021-12-02 NOTE — Telephone Encounter (Signed)
1st attempt to reach patient. Her vm is full and not accepting any new messages at this time.

## 2021-12-02 NOTE — ED Provider Notes (Signed)
[facility]-EMERGENCY DEPT Provider Note   CSN: [id number] Arrival date & time: 12/01/21  2257     History  Chief Complaint  Patient presents with   Abdominal Pain    Kelli Hudson is a 31 y.o. female with a hx of T1DM, gastroparesis, GERD, HTN, hypothyroidism, and hyperlipidemia who presents to the ED with complaints of nausea, vomiting, and abdominal pain.  Patient reports she woke up this morning with nausea and subsequent too numerous to count episodes of emesis with generalized abdominal pain.  She is not keeping anything down.  At times streaks of blood in the vomit.  No coffee-ground emesis.  Last bowel movement was today and was normal.  This feels like her gastroparesis.  She denies fever, chills, chest pain, melena, hematochezia, dysuria, or syncope.  She denies marijuana use.  HPI     Home Medications Prior to Admission medications   Medication Sig Start Date End Date Taking? Authorizing Provider  atorvastatin (LIPITOR) 20 MG tablet Take 1 tablet (20 mg total) by mouth daily. 10/29/21   Loyola Mast, MD  carvedilol (COREG) 12.5 MG tablet Take 1 tablet (12.5 mg total) by mouth 2 (two) times daily with a meal. 11/08/21   Kathlen Mody, MD  Continuous Blood Gluc Transmit (DEXCOM G6 TRANSMITTER) MISC Change every 90 days 11/29/21   Shamleffer, Konrad Dolores, MD  dicyclomine (BENTYL) 20 MG tablet Take 1 tablet (20 mg total) by mouth 3 (three) times daily as needed for spasms. 11/08/21   Kathlen Mody, MD  gabapentin (NEURONTIN) 300 MG capsule Take 300 mg by mouth 3 (three) times daily. 10/24/21   [provider]  hydrOXYzine (VISTARIL) 25 MG capsule Take 1 capsule (25 mg total) by mouth every 8 (eight) hours as needed. Patient taking differently: Take 25 mg by mouth every 8 (eight) hours as needed for anxiety. 07/31/21   Alwyn Ren, MD  insulin detemir (LEVEMIR FLEXTOUCH) 100 UNIT/ML FlexPen Inject 16 Units into the skin daily.  06/14/21   Burnadette Pop, MD  insulin regular (NOVOLIN R) 100 units/mL injection Inject 0-0.15 mLs (0-15 Units total) into the skin See admin instructions. Via pump.Start this medication after you follow up with your PCP Patient taking differently: Inject 0-15 Units into the skin See admin instructions. Using sliding scale 06/14/21   Burnadette Pop, MD  linaclotide [facility]) 290 MCG CAPS capsule Take 1 capsule (290 mcg total) by mouth daily before breakfast. 11/08/21   Kathlen Mody, MD  metoCLOPramide (REGLAN) 10 MG tablet Take 1 tablet (10 mg total) by mouth every 8 (eight) hours as needed for nausea or vomiting. 11/08/21   Kathlen Mody, MD  pantoprazole (PROTONIX) 40 MG tablet Take 1 tablet (40 mg total) by mouth 2 (two) times daily before a meal. 11/08/21   Kathlen Mody, MD  promethazine (PHENERGAN) 12.5 MG tablet Take 1 tablet (12.5 mg total) by mouth every 6 (six) hours as needed for nausea or vomiting. 11/08/21   Kathlen Mody, MD  Prucalopride Succinate (MOTEGRITY) 2 MG TABS Take 1 tablet (2 mg total) by mouth daily. 11/08/21   Kathlen Mody, MD  insulin aspart (NOVOLOG) 100 UNIT/ML FlexPen Inject 5 Units into the skin 3 (three) times daily with meals. 02/23/18 07/05/19  Jerald Kief, MD      Allergies    Patient has no known allergies.    Review of Systems   Review of Systems  Constitutional:  Negative for chills and fever.  Respiratory:  Negative for shortness of  breath.   Cardiovascular:  Negative for chest pain.  Gastrointestinal:  Positive for abdominal pain, nausea and vomiting. Negative for blood in stool and diarrhea.  Genitourinary:  Negative for dysuria.  Neurological:  Negative for syncope.  All other systems reviewed and are negative.   Physical Exam Updated Vital Signs BP (!) 187/128 (BP Location: Right Arm)   Pulse (!) 119   Temp 99.9 F (37.7 C) (Oral)   Resp 20   SpO2 98%  Physical Exam Vitals and nursing note reviewed.  Constitutional:      Appearance: She  is well-developed. She is not toxic-appearing.     Comments: Actively vomiting.  HENT:     Head: Normocephalic and atraumatic.  Eyes:     General:        Right eye: No discharge.        Left eye: No discharge.     Conjunctiva/sclera: Conjunctivae normal.  Cardiovascular:     Rate and Rhythm: Regular rhythm. Tachycardia present.  Pulmonary:     Effort: No respiratory distress.     Breath sounds: Normal breath sounds. No wheezing or rales.  Abdominal:     General: There is no distension.     Palpations: Abdomen is soft.     Tenderness: There is generalized abdominal tenderness. There is no guarding or rebound.  Musculoskeletal:     Cervical back: Neck supple.  Skin:    General: Skin is warm and dry.  Neurological:     Mental Status: She is alert.     Comments: Clear speech.   Psychiatric:        Behavior: Behavior normal.     ED Results / Procedures / Treatments   Labs (all labs ordered are listed, but only abnormal results are displayed) Labs Reviewed  COMPREHENSIVE METABOLIC PANEL - Abnormal; Notable for the following components:      Result Value   Glucose, Bld 324 (*)    BUN 25 (*)    Creatinine, Ser 1.11 (*)    All other components within normal limits  CBC - Abnormal; Notable for the following components:   RBC 3.74 (*)    Hemoglobin 11.0 (*)    HCT 33.4 (*)    Platelets 455 (*)    All other components within normal limits  CBG MONITORING, ED - Abnormal; Notable for the following components:   Glucose-Capillary 271 (*)    All other components within normal limits  LIPASE, BLOOD  URINALYSIS, ROUTINE W REFLEX MICROSCOPIC  I-STAT BETA HCG BLOOD, ED ([facility], [facility], [facility] ONLY)    EKG EKG Interpretation  Date/Time:  Monday December 02 2021 01:46:45 EDT Ventricular Rate:  115 PR Interval:  187 QRS Duration: 77 QT Interval:  304 QTC Calculation: 421 R Axis:   104 Text Interpretation: Right and left arm electrode reversal, interpretation assumes no reversal Sinus  tachycardia Biatrial enlargement Borderline right axis deviation Nonspecific T abnormalities, lateral leads Since last tracing Rate faster QT is no prolonged Confirmed by Susy Frizzle [phone]) on 12/02/2021 1:52:11 AM  Radiology No results found.  Procedures .Critical Care  Performed by: Cherly Anderson, PA-C Authorized by: Cherly Anderson, PA-C      CRITICAL CARE Performed by: Harvie Heck   Total critical care time: 30 minutes  Critical care time was exclusive of separately billable procedures and treating other patients.  Critical care was necessary to treat or prevent imminent or life-threatening deterioration.  Critical care was time spent personally by me on the following  activities: development of treatment plan with patient and/or surrogate as well as nursing, discussions with consultants, evaluation of patient's response to treatment, examination of patient, obtaining history from patient or surrogate, ordering and performing treatments and interventions, ordering and review of laboratory studies, ordering and review of radiographic studies, pulse oximetry and re-evaluation of patient's condition.    Medications Ordered in ED Medications  sodium chloride 0.9 % bolus 2,000 mL (0 mLs Intravenous Stopped 12/02/21 0426)  HYDROmorphone (DILAUDID) injection 1 mg (1 mg Intravenous Given 12/02/21 0235)  metoCLOPramide (REGLAN) injection 10 mg (10 mg Intravenous Given 12/02/21 0235)  famotidine (PEPCID) IVPB 20 mg premix (0 mg Intravenous Stopped 12/02/21 0330)  HYDROmorphone (DILAUDID) injection 1 mg (1 mg Intravenous Given 12/02/21 0321)  labetalol (NORMODYNE) injection 10 mg (10 mg Intravenous Given 12/02/21 0321)  prochlorperazine (COMPAZINE) injection 10 mg (10 mg Intravenous Given 12/02/21 0321)  labetalol (NORMODYNE) injection 20 mg (20 mg Intravenous Given 12/02/21 0418)  HYDROmorphone (DILAUDID) injection 1 mg (1 mg Intravenous Given 12/02/21 0422)     ED Course/ Medical Decision Making/ A&P                           Medical Decision Making Risk Prescription drug management. Decision regarding hospitalization.   Patient presents to the ED with complaints of N/V & abdominal pain, this involves an extensive number of treatment options, and is a complaint that carries with it a high risk of complications and morbidity. Nontoxic, vitals w/ hypertension & tachycardia.   Additional history obtained:  Chart/nursing notes reviewed.  External records viewed including prior admissions.   EKG: QTc less than 450.  Lab Tests:  I viewed & interpreted labs including:  CBC: Mild anemia similar to prior CMP: Hyperglycemia, normal bicarb and gap, renal function worsened most recently checked however fairly similar to prior ranges. Lipase: Within normal limits Pregnancy test: Negative  ED Course:  I ordered medications including Dilaudid for pain, Reglan for nausea, Pepcid for antiacid, and fluids for hydration.    Following above interventions patient w/ some relief, pain down to a 15/10 from a 20/10 per her report, she remains hypertensive, has not had her coreg, additional dilaudid ordered as well as compazine for ongoing nausea and labetalol for her BP.   04:00: RE-EVAL: Patient persistently hypertensive 220s/120s, pain improved but remains @ 10/10- additional dilaudid & labetalol ordered.   05:00: RE-EVAL: remains nauseated w/ dry heaves, but is more comfortable, BP improved to 140s/70s, remains without peritoneal signs on abdominal exam, continues to state this feels like her gastroparesis, low suspicion for acute surgical abdomen. Patient feels similar to when she has required admission before, will discuss w/ medicine.   05:25: CONSULT: Discussed with hospitalist Dr. Marlowe Sax- accepts admission.   Findings and plan of care discussed with supervising physician Dr. Karle Starch who is in agreement.  Portions of this note were generated with  Lobbyist. Dictation errors may occur despite best attempts at proofreading.  Final Clinical Impression(s) / ED Diagnoses Final diagnoses:  Generalized abdominal pain  Nausea and vomiting, unspecified vomiting type  Hyperglycemia  Hypertension, unspecified type    Rx / DC Orders ED Discharge Orders     None         Amaryllis Dyke, PA-C 12/02/21 0544    Truddie Hidden, MD 12/02/21 [phone]

## 2021-12-02 NOTE — ED Notes (Signed)
ED TO INPATIENT HANDOFF REPORT  ED Nurse Name and Phone #: Ria Comment [phone]  S Name/Age/Gender Kelli Hudson 31 y.o. female Room/Bed: WA23/WA23  Code Status   Code Status: Prior  Home/SNF/Other Home Patient oriented to: self, place, time, and situation Is this baseline? Yes   Triage Complete: Triage complete  Chief Complaint Intractable nausea and vomiting [R11.2] Gastroparesis [K31.84]  Triage Note Pt reports with abdominal pain since this morning. Pt states that she has gastroparesis.    Allergies No Known Allergies  Level of Care/Admitting Diagnosis ED Disposition     ED Disposition  Admit   Condition  --   Comment  Hospital Area: [place] [[id number]]  Level of Care: Telemetry [5]  Admit to tele based on following criteria: Monitor QTC interval  May admit patient to [facility] or [facility] if equivalent level of care is available:: No  Covid Evaluation: Asymptomatic - no recent exposure (last 10 days) testing not required  Diagnosis: Gastroparesis [536.3.ICD-9-CM]  Admitting Physician: Jonnie Finner [[id number]]  Attending Physician: Jonnie Finner [[id number]]  Certification:: I certify this patient will need inpatient services for at least 2 midnights  Estimated Length of Stay: 2          B Medical/Surgery History Past Medical History:  Diagnosis Date   Acute H. pylori gastric ulcer    Coffee ground emesis    Diabetes mellitus ([place])    Diabetic gastroparesis ([place])    DKA (diabetic ketoacidosis) ([place]) 02/24/2021   Gastroparesis    GERD (gastroesophageal reflux disease)    Hypertension    Hyperthyroidism    Intractable nausea and vomiting 04/20/2021   Normocytic anemia 04/20/2020   Prolonged Q-T interval on ECG    Past Surgical History:  Procedure Laterality Date   AMPUTATION TOE Left 03/10/2018   Procedure: AMPUTATION FIFTH TOE;  Surgeon: Trula Slade, DPM;  Location: [facility];  Service:  Podiatry;  Laterality: Left;   BIOPSY  01/28/2020   Procedure: BIOPSY;  Surgeon: Otis Brace, MD;  Location: [facility];  Service: Gastroenterology;;   BOTOX INJECTION  08/26/2021   Procedure: BOTOX INJECTION;  Surgeon: Doran Stabler, MD;  Location: [facility];  Service: Gastroenterology;;   ESOPHAGOGASTRODUODENOSCOPY N/A 01/28/2020   Procedure: ESOPHAGOGASTRODUODENOSCOPY (EGD);  Surgeon: Otis Brace, MD;  Location: [facility];  Service: Gastroenterology;  Laterality: N/A;   ESOPHAGOGASTRODUODENOSCOPY (EGD) WITH PROPOFOL Left 09/08/2015   Procedure: ESOPHAGOGASTRODUODENOSCOPY (EGD) WITH PROPOFOL;  Surgeon: Arta Silence, MD;  Location: [facility] ENDOSCOPY;  Service: Endoscopy;  Laterality: Left;   ESOPHAGOGASTRODUODENOSCOPY (EGD) WITH PROPOFOL N/A 08/26/2021   Procedure: ESOPHAGOGASTRODUODENOSCOPY (EGD) WITH PROPOFOL;  Surgeon: Doran Stabler, MD;  Location[facility];  Service: Gastroenterology;  Laterality: N/A;   WISDOM TOOTH EXTRACTION       A IV Location/Drains/Wounds Patient Lines/Drains/Airways Status     Active Line/Drains/Airways     Name Placement date Placement time Site Days   Peripheral IV 12/02/21 20 G Anterior;Left;Upper Arm 12/02/21  0228  Arm  less than 1            Intake/Output Last 24 hours No intake or output data in the 24 hours ending 12/02/21 1236  Labs/Imaging Results for orders placed or performed during the hospital encounter of 12/01/21 (from the past 48 hour(s))  Lipase, blood     Status: None   Collection Time: 12/01/21 11:07 PM  Result Value Ref Range   Lipase 40 11 - 51 U/L    Comment:  Performed at [facility], [address] [place], [place] [address]  Comprehensive metabolic panel     Status: Abnormal   Collection Time: 12/01/21 11:07 PM  Result Value Ref Range   Sodium 136 135 - 145 mmol/L   Potassium 3.7 3.5 - 5.1 mmol/L   Chloride 103 98 - 111 mmol/L   CO2 25 22 - 32 mmol/L   Glucose, Bld 324  (H) 70 - 99 mg/dL    Comment: Glucose reference range applies only to samples taken after fasting for at least 8 hours.   BUN 25 (H) 6 - 20 mg/dL   Creatinine, Ser 2.40 (H) 0.44 - 1.00 mg/dL   Calcium 9.6 8.9 - 97.3 mg/dL   Total Protein 7.5 6.5 - 8.1 g/dL   Albumin 3.5 3.5 - 5.0 g/dL   AST 21 15 - 41 U/L   ALT 23 0 - 44 U/L   Alkaline Phosphatase 94 38 - 126 U/L   Total Bilirubin 1.1 0.3 - 1.2 mg/dL   GFR, Estimated >53 >29 mL/min    Comment: (NOTE) Calculated using the CKD-EPI Creatinine Equation (2021)    Anion gap 8 5 - 15    Comment: Performed at [facility], [address], [place], [place] [address]  CBC     Status: Abnormal   Collection Time: 12/01/21 11:07 PM  Result Value Ref Range   WBC 8.8 4.0 - 10.5 K/uL   RBC 3.74 (L) 3.87 - 5.11 MIL/uL   Hemoglobin 11.0 (L) 12.0 - 15.0 g/dL   HCT 83.4 (L) 19.6 - 22.2 %   MCV 89.3 80.0 - 100.0 fL   MCH 29.4 26.0 - 34.0 pg   MCHC 32.9 30.0 - 36.0 g/dL   RDW 97.9 89.2 - 11.9 %   Platelets 455 (H) 150 - 400 K/uL   nRBC 0.0 0.0 - 0.2 %    Comment: Performed at [facility], [address] [place], [place] [address]  POC CBG, ED     Status: Abnormal   Collection Time: 12/01/21 11:13 PM  Result Value Ref Range   Glucose-Capillary 271 (H) 70 - 99 mg/dL    Comment: Glucose reference range applies only to samples taken after fasting for at least 8 hours.  I-Stat beta hCG blood, ED     Status: None   Collection Time: 12/02/21 12:01 AM  Result Value Ref Range   I-stat hCG, quantitative <5.0 <5 mIU/mL   Comment 3            Comment:   GEST. AGE      CONC.  (mIU/mL)   <=1 WEEK        5 - 50     2 WEEKS       50 - 500     3 WEEKS       100 - 10,000     4 WEEKS     1,000 - 30,000        FEMALE AND NON-PREGNANT FEMALE:     LESS THAN 5 mIU/mL   Urinalysis, Routine w reflex microscopic Urine, Clean Catch     Status: Abnormal   Collection Time: 12/02/21  8:16 AM  Result Value Ref Range   Color,  Urine YELLOW YELLOW   APPearance HAZY (A) CLEAR   Specific Gravity, Urine 1.017 1.005 - 1.030   pH 5.0 5.0 - 8.0   Glucose, UA >=500 (A) NEGATIVE mg/dL   Hgb urine dipstick NEGATIVE NEGATIVE  Bilirubin Urine NEGATIVE NEGATIVE   Ketones, ur 5 (A) NEGATIVE mg/dL   Protein, ur >=097 (A) NEGATIVE mg/dL   Nitrite NEGATIVE NEGATIVE   Leukocytes,Ua NEGATIVE NEGATIVE   RBC / HPF 6-10 0 - 5 RBC/hpf   WBC, UA 0-5 0 - 5 WBC/hpf   Bacteria, UA NONE SEEN NONE SEEN   Squamous Epithelial / LPF 0-5 0 - 5   Mucus PRESENT    Hyaline Casts, UA PRESENT     Comment: Performed at [facility], [address] [place], [place] [address]  Comprehensive metabolic panel     Status: Abnormal   Collection Time: 12/02/21  8:16 AM  Result Value Ref Range   Sodium 136 135 - 145 mmol/L   Potassium 4.6 3.5 - 5.1 mmol/L   Chloride 104 98 - 111 mmol/L   CO2 24 22 - 32 mmol/L   Glucose, Bld 346 (H) 70 - 99 mg/dL    Comment: Glucose reference range applies only to samples taken after fasting for at least 8 hours.   BUN 24 (H) 6 - 20 mg/dL   Creatinine, Ser 9.24 (H) 0.44 - 1.00 mg/dL   Calcium 8.8 (L) 8.9 - 10.3 mg/dL   Total Protein 6.2 (L) 6.5 - 8.1 g/dL   Albumin 2.8 (L) 3.5 - 5.0 g/dL   AST 20 15 - 41 U/L   ALT 20 0 - 44 U/L   Alkaline Phosphatase 78 38 - 126 U/L   Total Bilirubin 1.0 0.3 - 1.2 mg/dL   GFR, Estimated 43 (L) >60 mL/min    Comment: (NOTE) Calculated using the CKD-EPI Creatinine Equation (2021)    Anion gap 8 5 - 15    Comment: Performed at [facility], [address] [place], [place] [address]  CBC with Differential/Platelet     Status: Abnormal   Collection Time: 12/02/21  8:16 AM  Result Value Ref Range   WBC 9.0 4.0 - 10.5 K/uL   RBC 3.46 (L) 3.87 - 5.11 MIL/uL   Hemoglobin 10.0 (L) 12.0 - 15.0 g/dL   HCT 19.6 (L) 22.2 - 97.9 %   MCV 91.0 80.0 - 100.0 fL   MCH 28.9 26.0 - 34.0 pg   MCHC 31.7 30.0 - 36.0 g/dL   RDW 89.2 11.9 - 41.7 %    Platelets 372 150 - 400 K/uL   nRBC 0.0 0.0 - 0.2 %   Neutrophils Relative % 79 %   Neutro Abs 7.1 1.7 - 7.7 K/uL   Lymphocytes Relative 15 %   Lymphs Abs 1.3 0.7 - 4.0 K/uL   Monocytes Relative 6 %   Monocytes Absolute 0.5 0.1 - 1.0 K/uL   Eosinophils Relative 0 %   Eosinophils Absolute 0.0 0.0 - 0.5 K/uL   Basophils Relative 0 %   Basophils Absolute 0.0 0.0 - 0.1 K/uL   Immature Granulocytes 0 %   Abs Immature Granulocytes 0.03 0.00 - 0.07 K/uL    Comment: Performed at [facility], [address], [place], [place] [address]  POC CBG, ED     Status: Abnormal   Collection Time: 12/02/21 10:13 AM  Result Value Ref Range   Glucose-Capillary 286 (H) 70 - 99 mg/dL    Comment: Glucose reference range applies only to samples taken after fasting for at least 8 hours.  CBG monitoring, ED     Status: Abnormal   Collection Time: 12/02/21 12:01 PM  Result Value Ref Range   Glucose-Capillary 293 (H)  70 - 99 mg/dL    Comment: Glucose reference range applies only to samples taken after fasting for at least 8 hours.   No results found.  Pending Labs [employer] (From admission, onward)     Start     Ordered   Signed and Held  Creatinine, serum  (enoxaparin (LOVENOX)    CrCl >/= 30 ml/min)  Weekly,   R     Comments: while on enoxaparin therapy    Signed and Held   Signed and Held  Comprehensive metabolic panel  Tomorrow morning,   R        Signed and Held   Signed and Held  CBC  Tomorrow morning,   R        Signed and Held            Vitals/Pain Today's Vitals   12/02/21 1013 12/02/21 1049 12/02/21 1055 12/02/21 1115  BP: 102/67 (!) 97/53  113/78  Pulse: (!) 110 (!) 103  (!) 106  Resp: 15   12  Temp: 98.2 F (36.8 C)     TempSrc: Oral     SpO2: 99%   99%  PainSc:   8      Isolation Precautions No active isolations  Medications Medications  LORazepam (ATIVAN) injection 1 mg (has no administration in time range)  pantoprazole (PROTONIX)  injection 40 mg (40 mg Intravenous Given 12/02/21 1048)  prochlorperazine (COMPAZINE) injection 10 mg (has no administration in time range)  0.9 %  sodium chloride infusion ( Intravenous New Bag/Given 12/02/21 0815)  insulin aspart (novoLOG) injection 0-15 Units (8 Units Subcutaneous Given 12/02/21 1221)  insulin aspart (novoLOG) injection 0-5 Units (has no administration in time range)  insulin glargine-yfgn (SEMGLEE) injection 16 Units (has no administration in time range)  carvedilol (COREG) tablet 12.5 mg (0 mg Oral Hold 12/02/21 1049)  sodium chloride 0.9 % bolus 2,000 mL (0 mLs Intravenous Stopped 12/02/21 0426)  HYDROmorphone (DILAUDID) injection 1 mg (1 mg Intravenous Given 12/02/21 0235)  metoCLOPramide (REGLAN) injection 10 mg (10 mg Intravenous Given 12/02/21 0235)  famotidine (PEPCID) IVPB 20 mg premix (0 mg Intravenous Stopped 12/02/21 0330)  HYDROmorphone (DILAUDID) injection 1 mg (1 mg Intravenous Given 12/02/21 0321)  labetalol (NORMODYNE) injection 10 mg (10 mg Intravenous Given 12/02/21 0321)  prochlorperazine (COMPAZINE) injection 10 mg (10 mg Intravenous Given 12/02/21 0321)  labetalol (NORMODYNE) injection 20 mg (20 mg Intravenous Given 12/02/21 0418)  HYDROmorphone (DILAUDID) injection 1 mg (1 mg Intravenous Given 12/02/21 0422)    Mobility walks Low fall risk   Focused Assessments   R Recommendations: See Admitting Provider Note  Report given to:   Additional Notes:

## 2021-12-02 NOTE — H&P (Signed)
History and Physical    Patient: Kelli Hudson VQM:[id number] DOB: [birth year] DOA: 12/01/2021 DOS: the patient was seen and examined on 12/02/2021 PCP: [name], MD  Patient coming from: Home  Chief Complaint:  Chief Complaint  Patient presents with   Abdominal Pain   HPI: Kelli Hudson is a 31 y.o. female with medical history significant of HTN, prolonged Qt, DMt1, DM gastroparesis. Presenting with N/V and abdominal pain. She has had at least 15 admissions for the same this year alone. Yesterday morning, she started vomiting and having some global abdominal pain. She was able to take her normal anti-emetics and spasm meds. They helped initially. She tried to eat a little food later yesterday evening and her symptoms returned. This time when she tried her meds they were ineffective. She has not had any sick contacts, diarrhea or fever. When her symptoms did not resolve, she became concerned and came to the ED for assistance. She denies any other aggravating or alleviating factors.   Review of Systems: As mentioned in the history of present illness. All other systems reviewed and are negative. Past Medical History:  Diagnosis Date   Acute H. pylori gastric ulcer    Coffee ground emesis    Diabetes mellitus ([place])    Diabetic gastroparesis ([place])    DKA (diabetic ketoacidosis) ([place]) 02/24/2021   Gastroparesis    GERD (gastroesophageal reflux disease)    Hypertension    Hyperthyroidism    Intractable nausea and vomiting 04/20/2021   Normocytic anemia 04/20/2020   Prolonged Q-T interval on ECG    Past Surgical History:  Procedure Laterality Date   AMPUTATION TOE Left 03/10/2018   Procedure: AMPUTATION FIFTH TOE;  Surgeon: Trula Slade, DPM;  Location: [facility];  Service: Podiatry;  Laterality: Left;   BIOPSY  01/28/2020   Procedure: BIOPSY;  Surgeon: Otis Brace, MD;  Location: [facility];  Service: Gastroenterology;;   BOTOX  INJECTION  08/26/2021   Procedure: BOTOX INJECTION;  Surgeon: Doran Stabler, MD;  Location: [facility];  Service: Gastroenterology;;   ESOPHAGOGASTRODUODENOSCOPY N/A 01/28/2020   Procedure: ESOPHAGOGASTRODUODENOSCOPY (EGD);  Surgeon: Otis Brace, MD;  Location: [facility];  Service: Gastroenterology;  Laterality: N/A;   ESOPHAGOGASTRODUODENOSCOPY (EGD) WITH PROPOFOL Left 09/08/2015   Procedure: ESOPHAGOGASTRODUODENOSCOPY (EGD) WITH PROPOFOL;  Surgeon: Arta Silence, MD;  [facility];  Service: Endoscopy;  Laterality: Left;   ESOPHAGOGASTRODUODENOSCOPY (EGD) WITH PROPOFOL N/A 08/26/2021   Procedure: ESOPHAGOGASTRODUODENOSCOPY (EGD) WITH PROPOFOL;  Surgeon: Doran Stabler, MD;  Location: [facility];  Service: Gastroenterology;  Laterality: N/A;   WISDOM TOOTH EXTRACTION     Social History:  reports that she has never smoked. She has never used smokeless tobacco. She reports current alcohol use of about 1.0 standard drink of alcohol per week. She reports that she does not use drugs.  No Known Allergies  Family History  Problem Relation Age of Onset   Lung cancer Mother    Diabetes Mother    Bipolar disorder Father    Liver cancer Maternal Grandfather    Diabetes Maternal Aunt        x 2   Pancreatic cancer Maternal Uncle    Prostate cancer Maternal Uncle    Breast cancer Other        maternal great aunt   Heart disease Other    Ovarian cancer Other        maternal great aunt   Kidney disease Other  maternal great aunt   Colon cancer Neg Hx    Stomach cancer Neg Hx     Prior to Admission medications   Medication Sig Start Date End Date Taking? Authorizing Provider  atorvastatin (LIPITOR) 20 MG tablet Take 1 tablet (20 mg total) by mouth daily. 10/29/21   Loyola Mast, MD  carvedilol (COREG) 12.5 MG tablet Take 1 tablet (12.5 mg total) by mouth 2 (two) times daily with a meal. 11/08/21   Kathlen Mody, MD  Continuous Blood Gluc Transmit (DEXCOM  G6 TRANSMITTER) MISC Change every 90 days 11/29/21   Shamleffer, Konrad Dolores, MD  dicyclomine (BENTYL) 20 MG tablet Take 1 tablet (20 mg total) by mouth 3 (three) times daily as needed for spasms. 11/08/21   Kathlen Mody, MD  gabapentin (NEURONTIN) 300 MG capsule Take 300 mg by mouth 3 (three) times daily. 10/24/21   [provider]  hydrOXYzine (VISTARIL) 25 MG capsule Take 1 capsule (25 mg total) by mouth every 8 (eight) hours as needed. Patient taking differently: Take 25 mg by mouth every 8 (eight) hours as needed for anxiety. 07/31/21   Alwyn Ren, MD  insulin detemir (LEVEMIR FLEXTOUCH) 100 UNIT/ML FlexPen Inject 16 Units into the skin daily. 06/14/21   Burnadette Pop, MD  insulin regular (NOVOLIN R) 100 units/mL injection Inject 0-0.15 mLs (0-15 Units total) into the skin See admin instructions. Via pump.Start this medication after you follow up with your PCP Patient taking differently: Inject 0-15 Units into the skin See admin instructions. Using sliding scale 06/14/21   Burnadette Pop, MD  linaclotide [facility]) 290 MCG CAPS capsule Take 1 capsule (290 mcg total) by mouth daily before breakfast. 11/08/21   Kathlen Mody, MD  metoCLOPramide (REGLAN) 10 MG tablet Take 1 tablet (10 mg total) by mouth every 8 (eight) hours as needed for nausea or vomiting. 11/08/21   Kathlen Mody, MD  pantoprazole (PROTONIX) 40 MG tablet Take 1 tablet (40 mg total) by mouth 2 (two) times daily before a meal. 11/08/21   Kathlen Mody, MD  promethazine (PHENERGAN) 12.5 MG tablet Take 1 tablet (12.5 mg total) by mouth every 6 (six) hours as needed for nausea or vomiting. 11/08/21   Kathlen Mody, MD  Prucalopride Succinate (MOTEGRITY) 2 MG TABS Take 1 tablet (2 mg total) by mouth daily. 11/08/21   Kathlen Mody, MD  insulin aspart (NOVOLOG) 100 UNIT/ML FlexPen Inject 5 Units into the skin 3 (three) times daily with meals. 02/23/18 07/05/19  Jerald Kief, MD    Physical Exam: Vitals:   12/02/21 0345  12/02/21 0400 12/02/21 0445 12/02/21 0615  BP: (!) 203/118 (!) 222/123 (!) 148/73 (!) 140/55  Pulse: (!) 113 (!) 113 (!) 110 (!) 109  Resp: 12 11 (!) 9 12  Temp:    98.5 F (36.9 C)  TempSrc:    Oral  SpO2: 96% 97% 96% 96%   General: 31 y.o. female resting in bed in NAD Eyes: PERRL, normal sclera ENMT: Nares patent w/o discharge, orophaynx clear, dentition normal, ears w/o discharge/lesions/ulcers Neck: Supple, trachea midline Cardiovascular: tachy, +S1, S2, no m/g/r, equal pulses throughout Respiratory: CTABL, no w/r/r, normal WOB GI: BS+, NDNT, no masses noted, no organomegaly noted MSK: No e/c/c Neuro: A&O x 3, no focal deficits Psyc: Appropriate interaction and affect, calm/cooperative  Data Reviewed:  Results for orders placed or performed during the hospital encounter of 12/01/21 (from the past 24 hour(s))  Lipase, blood     Status: None   Collection Time: 12/01/21  11:07 PM  Result Value Ref Range   Lipase 40 11 - 51 U/L  Comprehensive metabolic panel     Status: Abnormal   Collection Time: 12/01/21 11:07 PM  Result Value Ref Range   Sodium 136 135 - 145 mmol/L   Potassium 3.7 3.5 - 5.1 mmol/L   Chloride 103 98 - 111 mmol/L   CO2 25 22 - 32 mmol/L   Glucose, Bld 324 (H) 70 - 99 mg/dL   BUN 25 (H) 6 - 20 mg/dL   Creatinine, Ser 8.52 (H) 0.44 - 1.00 mg/dL   Calcium 9.6 8.9 - 77.8 mg/dL   Total Protein 7.5 6.5 - 8.1 g/dL   Albumin 3.5 3.5 - 5.0 g/dL   AST 21 15 - 41 U/L   ALT 23 0 - 44 U/L   Alkaline Phosphatase 94 38 - 126 U/L   Total Bilirubin 1.1 0.3 - 1.2 mg/dL   GFR, Estimated >24 >23 mL/min   Anion gap 8 5 - 15  CBC     Status: Abnormal   Collection Time: 12/01/21 11:07 PM  Result Value Ref Range   WBC 8.8 4.0 - 10.5 K/uL   RBC 3.74 (L) 3.87 - 5.11 MIL/uL   Hemoglobin 11.0 (L) 12.0 - 15.0 g/dL   HCT 53.6 (L) 14.4 - 31.5 %   MCV 89.3 80.0 - 100.0 fL   MCH 29.4 26.0 - 34.0 pg   MCHC 32.9 30.0 - 36.0 g/dL   RDW 40.0 86.7 - 61.9 %   Platelets 455 (H) 150  - 400 K/uL   nRBC 0.0 0.0 - 0.2 %  POC CBG, ED     Status: Abnormal   Collection Time: 12/01/21 11:13 PM  Result Value Ref Range   Glucose-Capillary 271 (H) 70 - 99 mg/dL  I-Stat beta hCG blood, ED     Status: None   Collection Time: 12/02/21 12:01 AM  Result Value Ref Range   I-stat hCG, quantitative <5.0 <5 mIU/mL   Comment 3            Assessment and Plan: DMt1 DM gastroparesis Intractable N/V     - place in obs, tele     - her N is improving     - compazine/ativan     - fluids     - CLD; advance as tolerated     - pain control  HTN     - continue home regimen as tolerated  GERD     - protonix  Normocytic anemia     - at baseline, no evidence of bleed, follow  DM neuropathy     - continue home regimen  Advance Care Planning:   Code Status: FULL  Consults: None  Family Communication: None at bedside  Severity of Illness: The appropriate patient status for this patient is OBSERVATION. Observation status is judged to be reasonable and necessary in order to provide the required intensity of service to ensure the patient's safety. The patient's presenting symptoms, physical exam findings, and initial radiographic and laboratory data in the context of their medical condition is felt to place them at decreased risk for further clinical deterioration. Furthermore, it is anticipated that the patient will be medically stable for discharge from the hospital within 2 midnights of admission.   Author: Teddy Spike, DO 12/02/2021 7:52 AM  For on call review [email or web address].

## 2021-12-02 NOTE — Telephone Encounter (Signed)
Noted, thanks!

## 2021-12-02 NOTE — Telephone Encounter (Signed)
-----   Message from Yetta Flock, MD sent at 12/02/2021  8:43 AM EDT ----- [name] can you touch  base with this patient and see how she is doing? Her QTc while out of the hospital is normal which is great news. She has not been able to afford domperidone in the past. Not sure if she wants to try some low dose Reglan if she wants to be on something else? She is waiting on a consult in [place] for gastric pacemaker otherwise and I think should be on motegrity right now. I hope that is helping her. Thanks  ----- Message ----- From: [name], MD Sent: 11/29/2021   2:00 PM EDT To: Yetta Flock, MD  I saw Kelli Hudson today and performed an EKG as requester. Her QTc was 386, which is normal.  Sincerely,  Kelli Cancer, MD

## 2021-12-02 NOTE — Telephone Encounter (Signed)
Pt returned call. She is currently admitted. She states that she did not start [place] because her insurance did not cover it, but she is not having any issues with constipation at this time. Pt does have an appt with Dr. Elease Hashimoto at [facility] in [place], [place] on 12/09/21. Pt had no concerns at the end of the call.

## 2021-12-03 DIAGNOSIS — K3184 Gastroparesis: Secondary | ICD-10-CM | POA: Diagnosis not present

## 2021-12-03 LAB — COMPREHENSIVE METABOLIC PANEL
ALT: 21 U/L (ref 0–44)
AST: 24 U/L (ref 15–41)
Albumin: 3 g/dL — ABNORMAL LOW (ref 3.5–5.0)
Alkaline Phosphatase: 75 U/L (ref 38–126)
Anion gap: 6 (ref 5–15)
BUN: 23 mg/dL — ABNORMAL HIGH (ref 6–20)
CO2: 23 mmol/L (ref 22–32)
Calcium: 9.4 mg/dL (ref 8.9–10.3)
Chloride: 107 mmol/L (ref 98–111)
Creatinine, Ser: 1.2 mg/dL — ABNORMAL HIGH (ref 0.44–1.00)
GFR, Estimated: >60 mL/min (ref >60)
Glucose, Bld: 206 mg/dL — ABNORMAL HIGH (ref 70–99)
Potassium: 4.8 mmol/L (ref 3.5–5.1)
Sodium: 136 mmol/L (ref 135–145)
Total Bilirubin: 1 mg/dL (ref 0.3–1.2)
Total Protein: 6.4 g/dL — ABNORMAL LOW (ref 6.5–8.1)

## 2021-12-03 LAB — GLUCOSE, CAPILLARY
Glucose-Capillary: 173 mg/dL — ABNORMAL HIGH (ref 70–99)
Glucose-Capillary: 279 mg/dL — ABNORMAL HIGH (ref 70–99)
Glucose-Capillary: 179 mg/dL — ABNORMAL HIGH (ref 70–99)
Glucose-Capillary: 191 mg/dL — ABNORMAL HIGH (ref 70–99)

## 2021-12-03 LAB — CBC
HCT: 31.4 % — ABNORMAL LOW (ref 36.0–46.0)
Hemoglobin: 9.7 g/dL — ABNORMAL LOW (ref 12.0–15.0)
MCH: 28.9 pg (ref 26.0–34.0)
MCHC: 30.9 g/dL (ref 30.0–36.0)
MCV: 93.5 fL (ref 80.0–100.0)
Platelets: 397 10*3/uL (ref 150–400)
RBC: 3.36 MIL/uL — ABNORMAL LOW (ref 3.87–5.11)
RDW: 13.9 % (ref 11.5–15.5)
WBC: 9.9 10*3/uL (ref 4.0–10.5)
nRBC: 0 % (ref 0.0–0.2)

## 2021-12-03 MED ORDER — PANTOPRAZOLE SODIUM 40 MG PO TBEC
40.0000 mg | DELAYED_RELEASE_TABLET | Freq: Every day | ORAL | Status: DC
  Administered 2021-12-04 – 2021-12-05 (×2): 40 mg via ORAL
  Filled 2021-12-03 (×2): qty 1

## 2021-12-03 NOTE — Progress Notes (Signed)
Mobility Specialist - Progress Note   12/03/21 1025  Mobility  Activity Ambulated with assistance in hallway  Level of Assistance Modified independent, requires aide device or extra time  Assistive Device Other (Comment) (IV pole)  Distance Ambulated (ft) 600 ft  Activity Response Tolerated well  Mobility Referral Yes  $Mobility charge 1 Mobility   Pt received in bed and agreed for mobility, no c/o pain during ambulation, felt wozzy nearing EOS. Pt back to bed with all needs met.   Roderick Pee Mobility Specialist

## 2021-12-03 NOTE — Progress Notes (Signed)
PROGRESS NOTE Kelli Hudson  YQI:[id number] DOB: [birth year] DOA: 12/01/2021 PCP: Loyola Mast, MD   Brief Narrative/Hospital Course:  31 y.o.f w/ hypertension and prolonged QTc diabetes on insulin, diabetic gastroparesis presented with nausea vomiting abdominal pain, she has had previous 15 admissions pertaining to these. She was seen in the ED labs showed uncontrolled hyperglycemia 324 otherwise fairly stable blood work-up beta-hCG less than 5 UA with 5 ketones WBC 0-5 patient is admitted due to intractable nausea vomiting in the setting of gastroparesis  Subjective: She reports she was able to tolerate her breakfast this morning did not eat lunch, she would like to make sure she is able to tolerate her dinner before going home tomorrow.   Assessment and Plan:  Intractable nausea vomiting Diabetic gastroparesis: Patient tolerating full liquid diet, continue the same, advance to soft diet, continue Compazine/Ativan, gentle IV fluid hydration, Bentyl PPI, can do with current conservative measurement.  She is scheduled to see gastroparesis specialist for possible pacemaker in [place] in October 23.  She is followed by [name].  Type 1 diabetes mellitus on insulin with uncontrolled hyperglycemia: Continue SSI, Semglee 16 units and monitor blood sugar Recent Labs  Lab 12/02/21 1201 12/02/21 1709 12/02/21 2112 12/03/21 0746 12/03/21 1157  GLUCAP 293* 177* 134* 173* 279*    Essential hypertension: BP well controlled on Coreg.  GERD without esophagitis:cont ppi  Normocytic anemia: Hemoglobin overall stable 9 to 10 g range.  Monitor Recent Labs  Lab 12/01/21 2307 12/02/21 0816 12/03/21 0545  HGB 11.0* 10.0* 9.7*  HCT 33.4* 31.5* 31.4*    AKI in the setting of #1 creatinine peaked 1.69 improved 1.2 continue IV fluid hydration   DVT prophylaxis: enoxaparin (LOVENOX) injection 40 mg Start: 12/02/21 2200 Code Status:   Code Status: Full Code Family Communication:  plan of care discussed with patient at bedside. Patient status is: Inpatient because of intractable nausea vomiting Level of care: Telemetry   Dispo: The patient is from: home            Anticipated disposition: home tomorrow if tolerating diet well  Mobility Assessment (last 72 hours)     Mobility Assessment     Row Name 12/03/21 0705 12/02/21 2100 12/02/21 1400       Does patient have an order for bedrest or is patient medically unstable No - Continue assessment No - Continue assessment No - Continue assessment     What is the highest level of mobility based on the progressive mobility assessment? Level 6 (Walks independently in room and hall) - Balance while walking in room without assist - Complete Level 6 (Walks independently in room and hall) - Balance while walking in room without assist - Complete Level 6 (Walks independently in room and hall) - Balance while walking in room without assist - Complete               Objective: Vitals last 24 hrs: Vitals:   12/02/21 2110 12/03/21 0121 12/03/21 0427 12/03/21 1400  BP: 113/76 125/81 127/89 (!) 141/98  Pulse: 96 97 98 99  Resp: 18 20 18 18   Temp: (!) 97.3 F (36.3 C) 97.8 F (36.6 C) 97.7 F (36.5 C) 98.3 F (36.8 C)  TempSrc:  Oral Oral Oral  SpO2: 99% 98% 100% 100%   Weight change:   Physical Examination: General exam: alert awake, older than stated age HEENT:Oral mucosa moist, Ear/Nose WNL grossly Respiratory system: bilaterally clear BS, no use of accessory muscle Cardiovascular system: S1 & S2 +,  No JVD. Gastrointestinal system: Abdomen soft,NT,ND, BS+ Nervous System:Alert, awake, moving extremities. Extremities: LE edema neg,distal peripheral pulses palpable.  Skin: No rashes,no icterus. MSK: Normal muscle bulk,tone, power  Medications reviewed:  Scheduled Meds:  carvedilol  12.5 mg Oral BID WC   dicyclomine  20 mg Oral TID   enoxaparin (LOVENOX) injection  40 mg Subcutaneous Q24H   insulin aspart  0-15  Units Subcutaneous TID WC   insulin aspart  0-5 Units Subcutaneous QHS   insulin glargine-yfgn  16 Units Subcutaneous Daily   [START ON 12/04/2021] pantoprazole  40 mg Oral Daily   Continuous Infusions:  sodium chloride 100 mL/hr at 12/03/21 1151      Diet Order             Diet full liquid Room service appropriate? Yes; Fluid consistency: Thin  Diet effective now                    Unresulted Labs (From admission, onward)     Start     Ordered   12/09/21 0500  Creatinine, serum  (enoxaparin (LOVENOX)    CrCl >/= 30 ml/min)  Weekly,   R     Comments: while on enoxaparin therapy    12/02/21 1311           Data Reviewed: I have personally reviewed following labs and imaging studies CBC: Recent Labs  Lab 12/01/21 2307 12/02/21 0816 12/03/21 0545  WBC 8.8 9.0 9.9  NEUTROABS  --  7.1  --   HGB 11.0* 10.0* 9.7*  HCT 33.4* 31.5* 31.4*  MCV 89.3 91.0 93.5  PLT 455* 372 301   Basic Metabolic Panel: Recent Labs  Lab 12/01/21 2307 12/02/21 0816 12/03/21 0545  NA 136 136 136  K 3.7 4.6 4.8  CL 103 104 107  CO2 25 24 23   GLUCOSE 324* 346* 206*  BUN 25* 24* 23*  CREATININE 1.11* 1.65* 1.20*  CALCIUM 9.6 8.8* 9.4   GFR: Estimated Creatinine Clearance: 80.2 mL/min (A) (by C-G formula based on SCr of 1.2 mg/dL (H)). Liver Function Tests: Recent Labs  Lab 12/01/21 2307 12/02/21 0816 12/03/21 0545  AST 21 20 24   ALT 23 20 21   ALKPHOS 94 78 75  BILITOT 1.1 1.0 1.0  PROT 7.5 6.2* 6.4*  ALBUMIN 3.5 2.8* 3.0*   Recent Labs  Lab 12/01/21 2307  LIPASE 40    Radiology Studies: No results found.   LOS: 1 day   Antonieta Pert, MD [facility]  12/03/2021, 2:29 PM

## 2021-12-03 NOTE — Progress Notes (Signed)

## 2021-12-04 DIAGNOSIS — K3184 Gastroparesis: Secondary | ICD-10-CM | POA: Diagnosis not present

## 2021-12-04 LAB — GLUCOSE, CAPILLARY
Glucose-Capillary: 155 mg/dL — ABNORMAL HIGH (ref 70–99)
Glucose-Capillary: 166 mg/dL — ABNORMAL HIGH (ref 70–99)
Glucose-Capillary: 171 mg/dL — ABNORMAL HIGH (ref 70–99)
Glucose-Capillary: 202 mg/dL — ABNORMAL HIGH (ref 70–99)

## 2021-12-04 LAB — BASIC METABOLIC PANEL
Anion gap: 6 (ref 5–15)
Anion gap: 7 (ref 5–15)
BUN: 15 mg/dL (ref 6–20)
BUN: 15 mg/dL (ref 6–20)
CO2: 22 mmol/L (ref 22–32)
CO2: 20 mmol/L — ABNORMAL LOW (ref 22–32)
Calcium: 9.4 mg/dL (ref 8.9–10.3)
Calcium: 9.4 mg/dL (ref 8.9–10.3)
Chloride: 111 mmol/L (ref 98–111)
Chloride: 108 mmol/L (ref 98–111)
Creatinine, Ser: 0.97 mg/dL (ref 0.44–1.00)
Creatinine, Ser: 0.99 mg/dL (ref 0.44–1.00)
GFR, Estimated: >60 mL/min (ref >60)
GFR, Estimated: >60 mL/min (ref >60)
Glucose, Bld: 183 mg/dL — ABNORMAL HIGH (ref 70–99)
Glucose, Bld: 209 mg/dL — ABNORMAL HIGH (ref 70–99)
Potassium: 5.4 mmol/L — ABNORMAL HIGH (ref 3.5–5.1)
Potassium: 5.6 mmol/L — ABNORMAL HIGH (ref 3.5–5.1)
Sodium: 139 mmol/L (ref 135–145)
Sodium: 135 mmol/L (ref 135–145)

## 2021-12-04 LAB — POTASSIUM: Potassium: 5.5 mmol/L — ABNORMAL HIGH (ref 3.5–5.1)

## 2021-12-04 MED ORDER — SODIUM ZIRCONIUM CYCLOSILICATE 5 G PO PACK
5.0000 g | PACK | ORAL | Status: AC
  Administered 2021-12-04: 5 g via ORAL
  Filled 2021-12-04: qty 1

## 2021-12-04 MED ORDER — SODIUM ZIRCONIUM CYCLOSILICATE 10 G PO PACK
10.0000 g | PACK | Freq: Once | ORAL | Status: AC
  Administered 2021-12-04: 10 g via ORAL
  Filled 2021-12-04: qty 1

## 2021-12-04 MED ORDER — GABAPENTIN 300 MG PO CAPS
300.0000 mg | ORAL_CAPSULE | Freq: Three times a day (TID) | ORAL | Status: DC
  Administered 2021-12-04 – 2021-12-05 (×3): 300 mg via ORAL
  Filled 2021-12-04 (×3): qty 1

## 2021-12-04 MED ORDER — METOCLOPRAMIDE HCL 5 MG PO TABS: 10.0000 mg | ORAL_TABLET | Freq: Three times a day (TID) | ORAL | Status: DC | PRN

## 2021-12-04 NOTE — TOC Initial Note (Signed)
Transition of Care [facility]) - Initial/Assessment Note    Patient Details  Name: Kelli Hudson MRN: [id number] Date of Birth: 1990/08/23  Transition of Care [facility]) CM/SW Contact:    Vassie Moselle, LCSW Phone Number: 12/04/2021, 9:53 AM  Clinical Narrative:                 Met with pt for SDOH transportation insecurity. Pt shares that it has been hard to get to her medical appointments due to her mother and boyfriend being her primary means of transportation and having to work around their schedules. CSW offered resources to pt for transportation and encouraged pt to call her insurance as they will typically pay for transportation to medical appointments. Pt was agreeable to this. Resources have been added to pt's discharge instructions.   Expected Discharge Plan: Home/Self Care Barriers to Discharge: No Barriers Identified   Patient Goals and CMS Choice Patient states their goals for this hospitalization and ongoing recovery are:: To go home CMS Medicare.gov Compare Post Acute Care list provided to:: Patient Choice offered to / list presented to : Patient  Expected Discharge Plan and Services Expected Discharge Plan: Home/Self Care In-house Referral: NA Discharge Planning Services: NA Post Acute Care Choice: NA Living arrangements for the past 2 months: Single Family Home                 DME Arranged: N/A DME Agency: NA                  Prior Living Arrangements/Services Living arrangements for the past 2 months: Single Family Home Lives with:: Significant Other Patient language and need for interpreter reviewed:: Yes Do you feel safe going back to the place where you live?: Yes      Need for Family Participation in Patient Care: No (Comment) Care giver support system in place?: No (comment)   Criminal Activity/Legal Involvement Pertinent to Current Situation/Hospitalization: No - Comment as needed  Activities of Daily Living Home Assistive  Devices/Equipment: None ADL Screening (condition at time of admission) Patient's cognitive ability adequate to safely complete daily activities?: Yes Is the patient deaf or have difficulty hearing?: No Does the patient have difficulty seeing, even when wearing glasses/contacts?: No Does the patient have difficulty concentrating, remembering, or making decisions?: No Patient able to express need for assistance with ADLs?: Yes Does the patient have difficulty dressing or bathing?: No Independently performs ADLs?: Yes (appropriate for developmental age) Does the patient have difficulty walking or climbing stairs?: No Weakness of Legs: None Weakness of Arms/Hands: None  Permission Sought/Granted   Permission granted to share information with : No              Emotional Assessment Appearance:: Appears stated age Attitude/Demeanor/Rapport: Engaged Affect (typically observed): Accepting Orientation: : Oriented to Self, Oriented to Place, Oriented to  Time, Oriented to Situation Alcohol / Substance Use: Not Applicable Psych Involvement: No (comment)  Admission diagnosis:  Gastroparesis [K31.84] Generalized abdominal pain [R10.84] Hyperglycemia [R73.9] Intractable nausea and vomiting [R11.2] Hypertension, unspecified type [I10] Nausea and vomiting, unspecified vomiting type [R11.2] Patient Active Problem List   Diagnosis Date Noted   Intractable nausea and vomiting 10/09/2021   Drug-seeking behavior 09/21/2021   Essential hypertension 08/25/2021   GERD without esophagitis 08/25/2021   Prolonged Q-T interval on ECG    Ulcerated, foot, left, with fat layer exposed ([place]) 04/20/2021   Uncontrolled type 1 diabetes mellitus with hyperglycemia, with long-term current use of insulin ([place]) 12/18/2020   DM  type 1 (diabetes mellitus, type 1) ([place]) 12/18/2020   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 ([place]) 11/09/2020   Diabetic retinopathy of both eyes associated with  type 1 diabetes mellitus ([place]) 09/24/2020   Diabetic gastroparesis associated with type 1 diabetes mellitus ([place]) 08/12/2020   Generalized abdominal pain 07/23/2020   Diabetic nephropathy associated with type 1 diabetes mellitus ([place]) 06/27/2020   HLD (hyperlipidemia) 05/23/2020   Sinus tachycardia 05/09/2020   Normocytic anemia 04/20/2020   Depression with anxiety 07/05/2019   Diabetic polyneuropathy associated with type 1 diabetes mellitus ([place]) 09/24/2017   Gastroparesis    Goiter 10/05/2009   Hypertension associated with diabetes ([place]) 10/05/2009   PCP:  [name], MD Pharmacy:   [phone] - [address], [address] [place] [address] Phone: [phone] Fax: [phone]     Social Determinants of Health (SDOH) Interventions Housing Interventions: Intervention Not Indicated Transportation Interventions: Patient Resources (Friends/Family), Payor Benefit, Other (Comment) (Resources provided to patient)  Readmission Risk Interventions    12/04/2021    9:52 AM 11/06/2021   12:31 PM 10/11/2021    8:05 AM  Readmission Risk Prevention Plan  Transportation Screening Complete Complete Complete  Medication Review Press photographer) Complete Complete Complete  PCP or Specialist appointment within 3-5 days of discharge Complete Complete Complete  HRI or Home Care Consult Complete Complete Complete  SW Recovery Care/Counseling Consult Complete Complete Complete  Palliative Care Screening Not Applicable Not Applicable Not [place] Not Applicable Not Applicable Not Applicable

## 2021-12-04 NOTE — Progress Notes (Signed)
Mobility Specialist - Progress Note Pt refused mobility, claimed that they were tired today and would walk tomorrow.  Kelli Hudson Mobility Specialist

## 2021-12-04 NOTE — Plan of Care (Signed)

## 2021-12-04 NOTE — Progress Notes (Signed)
PROGRESS NOTE Kelli Hudson  GDJ:[id number] DOB: 1990/12/23 DOA: 12/01/2021 PCP: [name], MD   Brief Narrative/Hospital Course:  31 y.o.f w/ hypertension and prolonged QTc diabetes on insulin, diabetic gastroparesis presented with nausea vomiting abdominal pain, she has had previous 15 admissions pertaining to these. She was seen in the ED labs showed uncontrolled hyperglycemia 324 otherwise fairly stable blood work-up beta-hCG less than 5 UA with 5 ketones WBC 0-5 patient is admitted due to intractable nausea vomiting in the setting of gastroparesis  Subjective: Seen and examined tolerating diet well, potassium has been high  Denies taking tomatoes/juice or bananas.     Assessment and Plan:  Intractable nausea vomiting Diabetic gastroparesis: Patient tolerating full liquid diet> advance to soft diet and tolerating well.  Continue conservative management with compazine/Ativan, gentle IV fluid hydration, Bentyl PPI.She is scheduled to see gastroparesis specialist for possible pacemaker in [place] in October 23.  She is followed by Dr. Havery Moros.  Type 1 diabetes mellitus on insulin with uncontrolled hyperglycemia: Blood sugar well controlled at this time continue current insulin regimen and SSI and trend as below Recent Labs  Lab 12/03/21 1157 12/03/21 1802 12/03/21 2131 12/04/21 0753 12/04/21 1158  GLUCAP 279* 179* 191* 155* 166*   Hyperkalemia we will do Lokelma again, repeat labs tonight, counseled on low potassium diet Essential hypertension: BP well controlled on Coreg.  GERD without esophagitis:cont ppi  Normocytic anemia: Hemoglobin overall stable 9 to 10 g range.  Monitor Recent Labs  Lab 12/01/21 2307 12/02/21 0816 12/03/21 0545  HGB 11.0* 10.0* 9.7*  HCT 33.4* 31.5* 31.4*     AKI in the setting of #1 creatinine peaked 1.69> resolved. Wean ivf  DVT prophylaxis: enoxaparin (LOVENOX) injection 40 mg Start: 12/02/21 2200 Code Status:   Code  Status: Full Code Family Communication: plan of care discussed with patient at bedside. Patient status is: Inpatient because of intractable nausea vomiting Level of care: Telemetry   Dispo: The patient is from: home            Anticipated disposition: home tomorrow if tolerating diet and labs stable   Mobility Assessment (last 72 hours)     Mobility Assessment     Row Name 12/04/21 0847 12/03/21 2100 12/03/21 0705 12/02/21 2100 12/02/21 1400   Does patient have an order for bedrest or is patient medically unstable No - Continue assessment No - Continue assessment No - Continue assessment No - Continue assessment No - Continue assessment   What is the highest level of mobility based on the progressive mobility assessment? Level 6 (Walks independently in room and hall) - Balance while walking in room without assist - Complete Level 6 (Walks independently in room and hall) - Balance while walking in room without assist - Complete Level 6 (Walks independently in room and hall) - Balance while walking in room without assist - Complete Level 6 (Walks independently in room and hall) - Balance while walking in room without assist - Complete Level 6 (Walks independently in room and hall) - Balance while walking in room without assist - Complete             Objective: Vitals last 24 hrs: Vitals:   12/03/21 0427 12/03/21 1400 12/03/21 2007 12/04/21 1316  BP: 127/89 (!) 141/98 102/65 114/86  Pulse: 98 99 85 89  Resp: 18 18 18 17   Temp: 97.7 F (36.5 C) 98.3 F (36.8 C) 98.5 F (36.9 C) 98.4 F (36.9 C)  TempSrc: Oral Oral Oral Oral  SpO2: 100% 100% 97% 100%  Weight:   86 kg   Height:   5\' 9"  (1.753 m)    Weight change:   Physical Examination: General exam: alert awake, oriented, pleasant, older than stated age HEENT:Oral mucosa moist, Ear/Nose WNL grossly Respiratory system: Bilaterally clear BS, no use of accessory muscle Cardiovascular system: S1 & S2 +, No JVD. Gastrointestinal  system: Abdomen soft,NT,ND, BS+ Nervous System: Alert, awake, moving extremities, she follows commands. Extremities: LE edema neg,distal peripheral pulses palpable.  Skin: No rashes,no icterus. MSK: Normal muscle bulk,tone, power   Medications reviewed:  Scheduled Meds:  carvedilol  12.5 mg Oral BID WC   dicyclomine  20 mg Oral TID   enoxaparin (LOVENOX) injection  40 mg Subcutaneous Q24H   insulin aspart  0-15 Units Subcutaneous TID WC   insulin aspart  0-5 Units Subcutaneous QHS   insulin glargine-yfgn  16 Units Subcutaneous Daily   pantoprazole  40 mg Oral Daily   sodium zirconium cyclosilicate  10 g Oral Once   Continuous Infusions:  sodium chloride 50 mL/hr at 12/03/21 1508      Diet Order             DIET SOFT Room service appropriate? Yes; Fluid consistency: Thin  Diet effective now                    Unresulted Labs (From admission, onward)     Start     Ordered   12/09/21 0500  Creatinine, serum  (enoxaparin (LOVENOX)    CrCl >/= 30 ml/min)  Weekly,   R     Comments: while on enoxaparin therapy    12/02/21 1311   12/04/21 2100  Basic metabolic panel  Once-Timed,   TIMED       Question:  Specimen collection method  Answer:  Lab=Lab collect   12/04/21 1534           Data Reviewed: I have personally reviewed following labs and imaging studies CBC: Recent Labs  Lab 12/01/21 2307 12/02/21 0816 12/03/21 0545  WBC 8.8 9.0 9.9  NEUTROABS  --  7.1  --   HGB 11.0* 10.0* 9.7*  HCT 33.4* 31.5* 31.4*  MCV 89.3 91.0 93.5  PLT 455* 372 397    Basic Metabolic Panel: Recent Labs  Lab 12/01/21 2307 12/02/21 0816 12/03/21 0545 12/04/21 0827  NA 136 136 136 139  K 3.7 4.6 4.8 5.5*  5.4*  CL 103 104 107 111  CO2 25 24 23 22   GLUCOSE 324* 346* 206* 183*  BUN 25* 24* 23* 15  CREATININE 1.11* 1.65* 1.20* 0.97  CALCIUM 9.6 8.8* 9.4 9.4    GFR: Estimated Creatinine Clearance: 99.2 mL/min (by C-G formula based on SCr of 0.97 mg/dL). Liver Function  Tests: Recent Labs  Lab 12/01/21 2307 12/02/21 0816 12/03/21 0545  AST 21 20 24   ALT 23 20 21   ALKPHOS 94 78 75  BILITOT 1.1 1.0 1.0  PROT 7.5 6.2* 6.4*  ALBUMIN 3.5 2.8* 3.0*    Recent Labs  Lab 12/01/21 2307  LIPASE 40     Radiology Studies: No results found.   LOS: 2 days   , MD [facility]  12/04/2021, 3:56 PM

## 2021-12-04 NOTE — Discharge Summary (Signed)
Physician Discharge Summary  Kelli Hudson FGH:[id number] DOB: 08/31/1990 DOA: 12/01/2021  PCP: Loyola Mast, MD  Admit date: 12/01/2021 Discharge date: 12/05/2021 Recommendations for Outpatient Follow-up:  Follow up with PCP in 1 weeks-call for appointment Follow-up with gastroenterology Please obtain BMP/CBC in one week  Discharge Dispo: home Discharge Condition: Stable Code Status:   Code Status: Full Code Diet recommendation:  Diet Order             Diet Carb Modified Fluid consistency: Thin; Room service appropriate? 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 She was seen in the ED labs showed uncontrolled hyperglycemia 324 otherwise fairly stable blood work-up beta-hCG less than 5 UA with 5 ketones WBC 0-5 patient is admitted due to intractable nausea vomiting in the setting of gastroparesis Patient was treated conservatively with IV fluids antiemetics, at this time symptoms resolved tolerating diet.Her k is improved In 4.8 and tolerating diet and being discharged home in stable condition.advised low k diet.  Discharge Diagnoses:  Principal Problem:   Gastroparesis Active Problems:   Diabetic gastroparesis associated with type 1 diabetes mellitus (HCC)   Intractable nausea and vomiting   DM type 1 (diabetes mellitus, type 1) (HCC)   Essential hypertension   GERD without esophagitis   Normocytic anemia  Intractable nausea vomiting Diabetic gastroparesis: Patient is tolerating diet, conservatively treated with Compazine/Ativan, gentle IV fluid hydration, Bentyl PPI, opiates.  Tolerating diet and plan for discharge home today. She is scheduled to see gastroparesis specialist for possible pacemaker in [place] in October 23.  She is followed by Dr.  Adela Lank.  Type 1 diabetes mellitus on insulin with uncontrolled hyperglycemia-blood sugar stabilizing continue current insulin regimen and resume home dose of discharge Recent Labs  Lab 12/04/21 0753 12/04/21 1158 12/04/21 1655 12/04/21 2123 12/05/21 0811  GLUCAP 155* 166* 171* 202* 239*  Essential hypertension: BP well controlled on Coreg. GERD without esophagitis:cont ppi Normocytic anemia:Hb stable 9 to 10 g range.  Monitor stable AKI in the setting of #1 creatinine peaked 1.69 improved with fluids.  Mild hyperkalemia  k improved and stable for dc.  Consults: none Subjective: Alert awake and oriented, tolerated diet and feels well for home toda  Discharge Exam: Vitals:   12/04/21 2126 12/05/21 0335  BP: (!) 152/101 126/86  Pulse: 99 95  Resp: 19 18  Temp: 98.5 F (36.9 C) 98.1 F (36.7 C)  SpO2: 99% 100%   General: Pt is alert, awake, not in acute distress Cardiovascular: RRR, S1/S2 +, no rubs, no gallops Respiratory: CTA bilaterally, no wheezing, no rhonchi Abdominal: Soft, NT, ND, bowel sounds + Extremities: no edema, no cyanosis  Discharge Instructions  Discharge Instructions     Discharge instructions   Complete by: As directed    Check CBC BMP in 1 week, follow-up with the gastroenterologist physician in [place] and locally and with PCP at the scheduled Please call call MD or return to ER for similar or worsening recurring problem that brought you to hospital or if any fever,nausea/vomiting,abdominal pain, uncontrolled pain, chest pain,  shortness of breath or any other alarming symptoms.  Please follow-up your doctor as instructed in a week time and call the office for appointment.  Please avoid alcohol, smoking, or any other illicit substance and maintain  healthy habits including taking your regular medications as prescribed.  You were cared for by a hospitalist during your hospital stay. If you have any questions about your discharge medications or  the care you received while you were in the hospital after you are discharged, you can call the unit and ask to speak with the hospitalist on call if the hospitalist that took care of you is not available.  Once you are discharged, your primary care physician will handle any further medical issues. Please note that NO REFILLS for any discharge medications will be authorized once you are discharged, as it is imperative that you return to your primary care physician (or establish a relationship with a primary care physician if you do not have one) for your aftercare needs so that they can reassess your need for medications and monitor your lab values   Discharge instructions   Complete by: As directed    Please call call MD or return to ER for similar or worsening recurring problem that brought you to hospital or if any fever,nausea/vomiting,abdominal pain, uncontrolled pain, chest pain,  shortness of breath or any other alarming symptoms.  CONT WITH LOW POTASSIUM DIET as instructed Check bmp In 5-7 days from your primary care doctor as we discussed   Please follow-up your doctor as instructed in a week time and call the office for appointment.  Please avoid alcohol, smoking, or any other illicit substance and maintain healthy habits including taking your regular medications as prescribed.  You were cared for by a hospitalist during your hospital stay. If you have any questions about your discharge medications or the care you received while you were in the hospital after you are discharged, you can call the unit and ask to speak with the hospitalist on call if the hospitalist that took care of you is not available.  Once you are discharged, your primary care physician will handle any further medical issues. Please note that NO REFILLS for any discharge medications will be authorized once you are discharged, as it is imperative that you return to your primary care physician (or establish a relationship  with a primary care physician if you do not have one) for your aftercare needs so that they can reassess your need for medications and monitor your lab values   Increase activity slowly   Complete by: As directed    Increase activity slowly   Complete by: As directed       Allergies as of 12/05/2021   No Known Allergies      Medication List     TAKE these medications    atorvastatin 20 MG tablet Commonly known as: LIPITOR Take 1 tablet (20 mg total) by mouth daily.   carvedilol 12.5 MG tablet Commonly known as: COREG Take 1 tablet (12.5 mg total) by mouth 2 (two) times daily with a meal. What changed: how much to take   Dexcom G6 Sensor Misc Inject 1 Device into the skin See admin instructions. Place 1 sensor into the skin every 10 days after removing the former one   Dexcom G6 Transmitter Misc Change every 90 days   dicyclomine 20 MG tablet Commonly known as: BENTYL Take 1 tablet (20 mg total) by mouth 3 (three) times daily as needed for spasms.   gabapentin 300 MG capsule Commonly known as: NEURONTIN Take 300 mg by mouth 3 (three) times daily.   hydrOXYzine 25 MG capsule Commonly known as: VISTARIL Take 1 capsule (25 mg total) by mouth  every 8 (eight) hours as needed. What changed: reasons to take this   insulin regular 100 units/mL injection Commonly known as: NovoLIN R Inject 0-0.15 mLs (0-15 Units total) into the skin See admin instructions. Via pump.Start this medication after you follow up with your PCP What changed: additional instructions   Levemir FlexTouch 100 UNIT/ML FlexPen Generic drug: insulin detemir Inject 16 Units into the skin daily. What changed: when to take this   metoCLOPramide 10 MG tablet Commonly known as: REGLAN Take 1 tablet (10 mg total) by mouth every 8 (eight) hours as needed for nausea or vomiting. What changed: when to take this   Motegrity 2 MG Tabs Generic drug: Prucalopride Succinate Take 1 tablet (2 mg total) by mouth  daily.   ondansetron 4 MG tablet Commonly known as: ZOFRAN Take 4 mg by mouth every 8 (eight) hours as needed for nausea or vomiting.   pantoprazole 40 MG tablet Commonly known as: PROTONIX Take 1 tablet (40 mg total) by mouth 2 (two) times daily before a meal. What changed: when to take this   promethazine 12.5 MG tablet Commonly known as: PHENERGAN Take 1 tablet (12.5 mg total) by mouth every 6 (six) hours as needed for nausea or vomiting.   TURMERIC PO Take 1 capsule by mouth daily with breakfast.         Follow-up [facility] Follow up.   Why: Please call your insurance to access covered medical transportation to your medical appointments               No Known Allergies  The results of significant diagnostics from this hospitalization (including imaging, microbiology, ancillary and laboratory) are listed below for reference.    Microbiology: No results found for this or any previous visit (from the past 240 hour(s)).  Procedures/Studies: DG Foot Complete Left  Result Date: 11/12/2021 Please see detailed radiograph report in office note.  CT HEAD WO CONTRAST (5MM)  Result Date: 11/06/2021 CLINICAL DATA:  Altered level of consciousness EXAM: CT HEAD WITHOUT CONTRAST TECHNIQUE: Contiguous axial images were obtained from the base of the skull through the vertex without intravenous contrast. RADIATION DOSE REDUCTION: This exam was performed according to the departmental dose-optimization program which includes automated exposure control, adjustment of the mA and/or kV according to patient size and/or use of iterative reconstruction technique. COMPARISON:  None Available. FINDINGS: Brain: No acute infarct or hemorrhage. Lateral ventricles and midline structures are unremarkable. No acute extra-axial fluid collections. No mass effect. Vascular: No hyperdense vessel or unexpected calcification. Skull: Normal. Negative for fracture or focal lesion.  Sinuses/Orbits: No acute finding. Other: None. IMPRESSION: 1. No acute intracranial process. Electronically Signed   By: Randa Ngo M.D.   On: 11/06/2021 17:57    Labs: BNP (last 3 results) No results for input(s): "BNP" in the last 8760 hours. Basic Metabolic Panel: Recent Labs  Lab 12/02/21 0816 12/03/21 0545 12/04/21 0827 12/04/21 2055 12/05/21 0517  NA 136 136 139 135 135  K 4.6 4.8 5.5*  5.4* 5.6* 4.8  CL 104 107 111 108 108  CO2 24 23 22  20* 23  GLUCOSE 346* 206* 183* 209* 214*  BUN 24* 23* 15 15 16   CREATININE 1.65* 1.20* 0.97 0.99 0.92  CALCIUM 8.8* 9.4 9.4 9.4 9.4   Liver Function Tests: Recent Labs  Lab 12/01/21 2307 12/02/21 0816 12/03/21 0545  AST 21 20 24   ALT 23 20 21   ALKPHOS 94 78 75  BILITOT  1.1 1.0 1.0  PROT 7.5 6.2* 6.4*  ALBUMIN 3.5 2.8* 3.0*   Recent Labs  Lab 12/01/21 2307  LIPASE 40   No results for input(s): "AMMONIA" in the last 168 hours. CBC: Recent Labs  Lab 12/01/21 2307 12/02/21 0816 12/03/21 0545  WBC 8.8 9.0 9.9  NEUTROABS  --  7.1  --   HGB 11.0* 10.0* 9.7*  HCT 33.4* 31.5* 31.4*  MCV 89.3 91.0 93.5  PLT 455* 372 397   Cardiac Enzymes: No results for input(s): "CKTOTAL", "CKMB", "CKMBINDEX", "TROPONINI" in the last 168 hours. BNP: Invalid input(s): "POCBNP" CBG: Recent Labs  Lab 12/04/21 0753 12/04/21 1158 12/04/21 1655 12/04/21 2123 12/05/21 0811  GLUCAP 155* 166* 171* 202* 239*   D-Dimer No results for input(s): "DDIMER" in the last 72 hours. Hgb A1c No results for input(s): "HGBA1C" in the last 72 hours. Lipid Profile No results for input(s): "CHOL", "HDL", "LDLCALC", "TRIG", "CHOLHDL", "LDLDIRECT" in the last 72 hours. Thyroid function studies No results for input(s): "TSH", "T4TOTAL", "T3FREE", "THYROIDAB" in the last 72 hours.  Invalid input(s): "FREET3" Anemia work up No results for input(s): "VITAMINB12", "FOLATE", "FERRITIN", "TIBC", "IRON", "RETICCTPCT" in the last 72 hours. Urinalysis     Component Value Date/Time   COLORURINE YELLOW 12/02/2021 0816   APPEARANCEUR HAZY (A) 12/02/2021 0816   LABSPEC 1.017 12/02/2021 0816   PHURINE 5.0 12/02/2021 0816   GLUCOSEU >=500 (A) 12/02/2021 0816   GLUCOSEU NEGATIVE 05/22/2020 1417   HGBUR NEGATIVE 12/02/2021 0816   BILIRUBINUR NEGATIVE 12/02/2021 0816   KETONESUR 5 (A) 12/02/2021 0816   PROTEINUR >=300 (A) 12/02/2021 0816   UROBILINOGEN 0.2 05/22/2020 1417   NITRITE NEGATIVE 12/02/2021 0816   LEUKOCYTESUR NEGATIVE 12/02/2021 0816   Sepsis Labs Recent Labs  Lab 12/01/21 2307 12/02/21 0816 12/03/21 0545  WBC 8.8 9.0 9.9   Microbiology No results found for this or any previous visit (from the past 240 hour(s)).  Time coordinating discharge: 25 minutes SIGNED: Lanae Boast, MD  [facility] 12/05/2021, 1:32 PM  If 7PM-7AM, please contact night-coverage [email or web address]

## 2021-12-05 DIAGNOSIS — K3184 Gastroparesis: Secondary | ICD-10-CM | POA: Diagnosis not present

## 2021-12-05 LAB — BASIC METABOLIC PANEL
Anion gap: 4 — ABNORMAL LOW (ref 5–15)
BUN: 16 mg/dL (ref 6–20)
CO2: 23 mmol/L (ref 22–32)
Calcium: 9.4 mg/dL (ref 8.9–10.3)
Chloride: 108 mmol/L (ref 98–111)
Creatinine, Ser: 0.92 mg/dL (ref 0.44–1.00)
GFR, Estimated: >60 mL/min (ref >60)
Glucose, Bld: 214 mg/dL — ABNORMAL HIGH (ref 70–99)
Potassium: 4.8 mmol/L (ref 3.5–5.1)
Sodium: 135 mmol/L (ref 135–145)

## 2021-12-05 LAB — GLUCOSE, CAPILLARY: Glucose-Capillary: 239 mg/dL — ABNORMAL HIGH (ref 70–99)

## 2021-12-05 NOTE — Progress Notes (Signed)
Verbal and printed AVS discharge instructions given to [facility]. Kelli Hudson verbalized understanding to follow up with PCP and GI for lab rechecks and follow up. Pt to DC home with friend driving her home. All belonging taken.

## 2021-12-06 ENCOUNTER — Telehealth: Payer: Self-pay

## 2021-12-06 NOTE — Telephone Encounter (Signed)
Transition Care Management Unsuccessful Follow-up Telephone Call  Date of discharge and from where:  12/05/21 [facility]  Attempts:  1st Attempt  Reason for unsuccessful TCM follow-up call:  Left voice message

## 2021-12-07 ENCOUNTER — Other Ambulatory Visit: Payer: Self-pay

## 2021-12-07 ENCOUNTER — Inpatient Hospital Stay (HOSPITAL_COMMUNITY)
Admission: EM | Admit: 2021-12-07 | Discharge: 2021-12-12 | DRG: 074 | Disposition: A | Discharge status: Home / Self Care | Payer: 59 | Attending: Internal Medicine | Admitting: Internal Medicine

## 2021-12-07 DIAGNOSIS — E1143 Type 2 diabetes mellitus with diabetic autonomic (poly)neuropathy: Secondary | ICD-10-CM | POA: Diagnosis present

## 2021-12-07 DIAGNOSIS — E039 Hypothyroidism, unspecified: Secondary | ICD-10-CM | POA: Diagnosis present

## 2021-12-07 DIAGNOSIS — Z597 Insufficient social insurance and welfare support: Secondary | ICD-10-CM

## 2021-12-07 DIAGNOSIS — Z794 Long term (current) use of insulin: Secondary | ICD-10-CM

## 2021-12-07 DIAGNOSIS — K3184 Gastroparesis: Secondary | ICD-10-CM | POA: Diagnosis present

## 2021-12-07 DIAGNOSIS — F419 Anxiety disorder, unspecified: Secondary | ICD-10-CM | POA: Diagnosis present

## 2021-12-07 DIAGNOSIS — E1343 Other specified diabetes mellitus with diabetic autonomic (poly)neuropathy: Principal | ICD-10-CM

## 2021-12-07 DIAGNOSIS — Z8711 Personal history of peptic ulcer disease: Secondary | ICD-10-CM

## 2021-12-07 DIAGNOSIS — E1043 Type 1 diabetes mellitus with diabetic autonomic (poly)neuropathy: Secondary | ICD-10-CM | POA: Diagnosis not present

## 2021-12-07 DIAGNOSIS — R809 Proteinuria, unspecified: Secondary | ICD-10-CM | POA: Diagnosis present

## 2021-12-07 DIAGNOSIS — Z833 Family history of diabetes mellitus: Secondary | ICD-10-CM

## 2021-12-07 DIAGNOSIS — D649 Anemia, unspecified: Secondary | ICD-10-CM | POA: Diagnosis present

## 2021-12-07 DIAGNOSIS — R112 Nausea with vomiting, unspecified: Secondary | ICD-10-CM | POA: Diagnosis not present

## 2021-12-07 DIAGNOSIS — E1065 Type 1 diabetes mellitus with hyperglycemia: Secondary | ICD-10-CM | POA: Diagnosis present

## 2021-12-07 DIAGNOSIS — I10 Essential (primary) hypertension: Secondary | ICD-10-CM | POA: Diagnosis present

## 2021-12-07 DIAGNOSIS — E860 Dehydration: Secondary | ICD-10-CM | POA: Diagnosis present

## 2021-12-07 DIAGNOSIS — K219 Gastro-esophageal reflux disease without esophagitis: Secondary | ICD-10-CM | POA: Diagnosis present

## 2021-12-07 DIAGNOSIS — R000 Tachycardia, unspecified: Secondary | ICD-10-CM | POA: Diagnosis not present

## 2021-12-07 DIAGNOSIS — E871 Hypo-osmolality and hyponatremia: Secondary | ICD-10-CM | POA: Diagnosis present

## 2021-12-07 DIAGNOSIS — E785 Hyperlipidemia, unspecified: Secondary | ICD-10-CM | POA: Diagnosis present

## 2021-12-07 DIAGNOSIS — Z79899 Other long term (current) drug therapy: Secondary | ICD-10-CM

## 2021-12-07 DIAGNOSIS — R9431 Abnormal electrocardiogram [ECG] [EKG]: Secondary | ICD-10-CM | POA: Diagnosis present

## 2021-12-07 DIAGNOSIS — N179 Acute kidney failure, unspecified: Secondary | ICD-10-CM | POA: Diagnosis present

## 2021-12-07 DIAGNOSIS — E0590 Thyrotoxicosis, unspecified without thyrotoxic crisis or storm: Secondary | ICD-10-CM | POA: Diagnosis present

## 2021-12-07 DIAGNOSIS — Z89422 Acquired absence of other left toe(s): Secondary | ICD-10-CM

## 2021-12-07 DIAGNOSIS — Z950 Presence of cardiac pacemaker: Secondary | ICD-10-CM

## 2021-12-07 LAB — URINALYSIS, MICROSCOPIC (REFLEX)

## 2021-12-07 LAB — COMPREHENSIVE METABOLIC PANEL
ALT: 18 U/L (ref 0–44)
AST: 18 U/L (ref 15–41)
Albumin: 3.5 g/dL (ref 3.5–5.0)
Alkaline Phosphatase: 86 U/L (ref 38–126)
Anion gap: 9 (ref 5–15)
BUN: 18 mg/dL (ref 6–20)
CO2: 20 mmol/L — ABNORMAL LOW (ref 22–32)
Calcium: 9.6 mg/dL (ref 8.9–10.3)
Chloride: 109 mmol/L (ref 98–111)
Creatinine, Ser: 1.22 mg/dL — ABNORMAL HIGH (ref 0.44–1.00)
GFR, Estimated: >60 mL/min (ref >60)
Glucose, Bld: 313 mg/dL — ABNORMAL HIGH (ref 70–99)
Potassium: 4.5 mmol/L (ref 3.5–5.1)
Sodium: 138 mmol/L (ref 135–145)
Total Bilirubin: 0.8 mg/dL (ref 0.3–1.2)
Total Protein: 7.4 g/dL (ref 6.5–8.1)

## 2021-12-07 LAB — LIPASE, BLOOD: Lipase: 27 U/L (ref 11–51)

## 2021-12-07 LAB — CBG MONITORING, ED
Glucose-Capillary: 336 mg/dL — ABNORMAL HIGH (ref 70–99)
Glucose-Capillary: 328 mg/dL — ABNORMAL HIGH (ref 70–99)

## 2021-12-07 LAB — URINALYSIS, ROUTINE W REFLEX MICROSCOPIC
Bilirubin Urine: NEGATIVE
Glucose, UA: >=500 mg/dL — AB
Ketones, ur: 15 mg/dL — AB
Leukocytes,Ua: NEGATIVE
Nitrite: NEGATIVE
Protein, ur: >300 mg/dL — AB
Specific Gravity, Urine: 1.025 (ref 1.005–1.030)
pH: 6 (ref 5.0–8.0)

## 2021-12-07 LAB — HEMOGLOBIN AND HEMATOCRIT, BLOOD
HCT: 34.7 % — ABNORMAL LOW (ref 36.0–46.0)
HCT: 30.2 % — ABNORMAL LOW (ref 36.0–46.0)
HCT: 30.9 % — ABNORMAL LOW (ref 36.0–46.0)
Hemoglobin: 11.3 g/dL — ABNORMAL LOW (ref 12.0–15.0)
Hemoglobin: 9.9 g/dL — ABNORMAL LOW (ref 12.0–15.0)
Hemoglobin: 10 g/dL — ABNORMAL LOW (ref 12.0–15.0)

## 2021-12-07 LAB — CBC
HCT: 38.8 % (ref 36.0–46.0)
Hemoglobin: 12.3 g/dL (ref 12.0–15.0)
MCH: 29.2 pg (ref 26.0–34.0)
MCHC: 31.7 g/dL (ref 30.0–36.0)
MCV: 92.2 fL (ref 80.0–100.0)
Platelets: 380 10*3/uL (ref 150–400)
RBC: 4.21 MIL/uL (ref 3.87–5.11)
RDW: 13.5 % (ref 11.5–15.5)
WBC: 7.4 10*3/uL (ref 4.0–10.5)
nRBC: 0 % (ref 0.0–0.2)

## 2021-12-07 LAB — MAGNESIUM: Magnesium: 1.5 mg/dL — ABNORMAL LOW (ref 1.7–2.4)

## 2021-12-07 LAB — GLUCOSE, CAPILLARY: Glucose-Capillary: 211 mg/dL — ABNORMAL HIGH (ref 70–99)

## 2021-12-07 LAB — I-STAT BETA HCG BLOOD, ED (MC, WL, AP ONLY): I-stat hCG, quantitative: <5 m[IU]/mL (ref <5)

## 2021-12-07 MED ORDER — METOPROLOL TARTRATE 5 MG/5ML IV SOLN
5.0000 mg | Freq: Once | INTRAVENOUS | Status: AC
  Administered 2021-12-07: 5 mg via INTRAVENOUS
  Filled 2021-12-07: qty 5

## 2021-12-07 MED ORDER — SODIUM CHLORIDE 0.9 % IV SOLN: Freq: Once | INTRAVENOUS | Status: DC

## 2021-12-07 MED ORDER — INSULIN ASPART 100 UNIT/ML IJ SOLN
0.0000 [IU] | Freq: Three times a day (TID) | INTRAMUSCULAR | Status: DC
  Administered 2021-12-07: 3 [IU] via SUBCUTANEOUS
  Administered 2021-12-07: 7 [IU] via SUBCUTANEOUS
  Administered 2021-12-08 (×2): 3 [IU] via SUBCUTANEOUS
  Administered 2021-12-08: 5 [IU] via SUBCUTANEOUS
  Administered 2021-12-09: 2 [IU] via SUBCUTANEOUS
  Administered 2021-12-09: 3 [IU] via SUBCUTANEOUS
  Administered 2021-12-10: 2 [IU] via SUBCUTANEOUS
  Administered 2021-12-10: 5 [IU] via SUBCUTANEOUS
  Administered 2021-12-11: 2 [IU] via SUBCUTANEOUS
  Administered 2021-12-11: 1 [IU] via SUBCUTANEOUS
  Filled 2021-12-07: qty 0.09

## 2021-12-07 MED ORDER — METOCLOPRAMIDE HCL 5 MG/ML IJ SOLN: 10.0000 mg | Freq: Four times a day (QID) | INTRAMUSCULAR | Status: DC

## 2021-12-07 MED ORDER — DIPHENHYDRAMINE HCL 50 MG/ML IJ SOLN
12.5000 mg | Freq: Once | INTRAMUSCULAR | Status: AC
  Administered 2021-12-07: 12.5 mg via INTRAVENOUS
  Filled 2021-12-07: qty 1

## 2021-12-07 MED ORDER — SODIUM CHLORIDE 0.45 % IV SOLN: INTRAVENOUS | Status: DC

## 2021-12-07 MED ORDER — HYDROMORPHONE HCL 1 MG/ML IJ SOLN
0.5000 mg | INTRAMUSCULAR | Status: AC | PRN
  Administered 2021-12-07 – 2021-12-08 (×4): 0.5 mg via INTRAVENOUS
  Filled 2021-12-07 (×4): qty 0.5

## 2021-12-07 MED ORDER — METOCLOPRAMIDE HCL 5 MG/ML IJ SOLN
10.0000 mg | Freq: Once | INTRAMUSCULAR | Status: AC
  Administered 2021-12-07: 10 mg via INTRAVENOUS
  Filled 2021-12-07: qty 2

## 2021-12-07 MED ORDER — SODIUM CHLORIDE 0.9 % IV BOLUS (SEPSIS)
1000.0000 mL | Freq: Once | INTRAVENOUS | Status: AC
  Administered 2021-12-07: 1000 mL via INTRAVENOUS

## 2021-12-07 MED ORDER — PROCHLORPERAZINE EDISYLATE 10 MG/2ML IJ SOLN
10.0000 mg | Freq: Four times a day (QID) | INTRAMUSCULAR | Status: DC | PRN
  Administered 2021-12-08 – 2021-12-10 (×3): 10 mg via INTRAVENOUS
  Filled 2021-12-07 (×3): qty 2

## 2021-12-07 MED ORDER — INSULIN DETEMIR 100 UNIT/ML ~~LOC~~ SOLN
16.0000 [IU] | Freq: Every day | SUBCUTANEOUS | Status: DC
  Administered 2021-12-07 – 2021-12-12 (×6): 16 [IU] via SUBCUTANEOUS
  Filled 2021-12-07 (×6): qty 0.16

## 2021-12-07 MED ORDER — METOPROLOL TARTRATE 5 MG/5ML IV SOLN
5.0000 mg | Freq: Three times a day (TID) | INTRAVENOUS | Status: DC
  Administered 2021-12-07: 5 mg via INTRAVENOUS
  Filled 2021-12-07: qty 5

## 2021-12-07 MED ORDER — SODIUM CHLORIDE 0.9 % IV SOLN
1000.0000 mL | INTRAVENOUS | Status: DC
  Administered 2021-12-07: 1000 mL via INTRAVENOUS

## 2021-12-07 MED ORDER — METOCLOPRAMIDE HCL 5 MG/ML IJ SOLN
10.0000 mg | Freq: Four times a day (QID) | INTRAMUSCULAR | Status: DC
  Administered 2021-12-07 – 2021-12-12 (×20): 10 mg via INTRAVENOUS
  Filled 2021-12-07 (×20): qty 2

## 2021-12-07 MED ORDER — SODIUM CHLORIDE 0.45 % IV BOLUS
1000.0000 mL | Freq: Once | INTRAVENOUS | Status: AC
  Administered 2021-12-07: 1000 mL via INTRAVENOUS

## 2021-12-07 MED ORDER — HYOSCYAMINE SULFATE 0.125 MG PO TABS
0.2500 mg | ORAL_TABLET | Freq: Once | ORAL | Status: AC
  Administered 2021-12-07: 0.25 mg via ORAL
  Filled 2021-12-07: qty 2

## 2021-12-07 MED ORDER — ONDANSETRON HCL 4 MG/2ML IJ SOLN
4.0000 mg | Freq: Four times a day (QID) | INTRAMUSCULAR | Status: DC | PRN
  Administered 2021-12-10: 4 mg via INTRAVENOUS
  Filled 2021-12-07: qty 2

## 2021-12-07 MED ORDER — ONDANSETRON HCL 4 MG PO TABS: 4.0000 mg | ORAL_TABLET | Freq: Four times a day (QID) | ORAL | Status: DC | PRN

## 2021-12-07 MED ORDER — LORAZEPAM 2 MG/ML IJ SOLN
1.0000 mg | Freq: Once | INTRAMUSCULAR | Status: AC
  Administered 2021-12-07: 1 mg via INTRAVENOUS
  Filled 2021-12-07: qty 1

## 2021-12-07 MED ORDER — ACETAMINOPHEN 650 MG RE SUPP: 650.0000 mg | Freq: Four times a day (QID) | RECTAL | Status: DC | PRN

## 2021-12-07 MED ORDER — HYDROMORPHONE HCL 1 MG/ML IJ SOLN
1.0000 mg | Freq: Once | INTRAMUSCULAR | Status: AC
  Administered 2021-12-07: 1 mg via INTRAVENOUS
  Filled 2021-12-07: qty 1

## 2021-12-07 MED ORDER — LABETALOL HCL 5 MG/ML IV SOLN
20.0000 mg | Freq: Once | INTRAVENOUS | Status: AC
  Administered 2021-12-07: 20 mg via INTRAVENOUS
  Filled 2021-12-07: qty 4

## 2021-12-07 MED ORDER — MAGNESIUM SULFATE 2 GM/50ML IV SOLN
2.0000 g | Freq: Once | INTRAVENOUS | Status: AC
  Administered 2021-12-07: 2 g via INTRAVENOUS
  Filled 2021-12-07: qty 50

## 2021-12-07 MED ORDER — PANTOPRAZOLE SODIUM 40 MG IV SOLR
40.0000 mg | Freq: Two times a day (BID) | INTRAVENOUS | Status: DC
  Administered 2021-12-07 – 2021-12-11 (×8): 40 mg via INTRAVENOUS
  Filled 2021-12-07 (×9): qty 10

## 2021-12-07 MED ORDER — ACETAMINOPHEN 325 MG PO TABS: 650.0000 mg | ORAL_TABLET | Freq: Four times a day (QID) | ORAL | Status: DC | PRN

## 2021-12-07 MED ORDER — HALOPERIDOL LACTATE 5 MG/ML IJ SOLN
5.0000 mg | Freq: Once | INTRAMUSCULAR | Status: AC
  Administered 2021-12-07: 5 mg via INTRAVENOUS
  Filled 2021-12-07: qty 1

## 2021-12-07 MED ORDER — PROCHLORPERAZINE EDISYLATE 10 MG/2ML IJ SOLN
10.0000 mg | Freq: Once | INTRAMUSCULAR | Status: AC
  Administered 2021-12-07: 10 mg via INTRAVENOUS
  Filled 2021-12-07: qty 2

## 2021-12-07 MED ORDER — PANTOPRAZOLE SODIUM 40 MG IV SOLR
40.0000 mg | Freq: Once | INTRAVENOUS | Status: AC
  Administered 2021-12-07: 40 mg via INTRAVENOUS
  Filled 2021-12-07: qty 10

## 2021-12-07 MED ORDER — KETOROLAC TROMETHAMINE 15 MG/ML IJ SOLN
15.0000 mg | Freq: Once | INTRAMUSCULAR | Status: AC
  Administered 2021-12-07: 15 mg via INTRAVENOUS
  Filled 2021-12-07: qty 1

## 2021-12-07 NOTE — Consult Note (Signed)
Consultation  Referring Provider:     Reubin Milan, MD Primary Care Physician:  [name], MD Primary Gastroenterologist:       Dr. Havery Moros Reason for Consultation:     Nausea/vomiting, gastroparesis         HPI:   Kelli Hudson is a 31 y.o. female with a history of diabetes complicated by gastroparesis, anxiety, HTN, hypothyroidism, GERD with multiple hospital admissions over the past year due to intractable nausea/vomiting and exacerbations of underlying gastroparesis.  Her most recent hospital admission was 10/15-19 again with nausea/vomiting and abdominal pain.  Was treated with Compazine, Ativan, IV fluid, PPI, pain medications, and discharged home.  She presents with essentially the same symptoms of nausea, nonbloody emesis, and generalized abdominal pain.  ER evaluation notable for tachycardia with HR in the 120s.  Has been treated with IV fluid, Ativan, antiemetics.  On my exam she tells me "I only want Dilaudid, that is the only thing that works for me".  During her hospital admission in September she was evaluated by GI, IR, surgery with consideration for venting PEG placement.  She is otherwise been treated with Zofran, Phenergan, Compazine, and some medication options (erythromycin, Reglan, domperidone) has been limited by QTc prolongation on prior admissions and outpatient.  Underwent EGD in 08/2021 which was normal and treated with empiric Botox injection of pylorus which was ultimately ineffective.  As an outpatient, has been trying to get her on Motegrity, but not covered by her insurance.  Most recent outpatient EKG with normal QTc, and EKG on this admission with QTc 400, which at least allows for some expansion of medication options.  She is actually scheduled for an appointment with Dr. Vickki Muff at [facility] in [place], [place] on 12/09/2021 for evaluation of gastric pacemaker placement.  Hospital admission today notable for the  following: - CBC normal - BMP normal other than BG 313 - Normal liver enzymes, lipase - EKG: Sinus tachycardia with HR 132, QTc 400  PREVIOUS ENDOSCOPIES:            EGD 08/26/2021: - Normal larynx. - Normal esophagus. - Normal stomach. Clinical - Normal stomach. Clinical question of pyloric spasm - pylorus injected with botulinum toxin. - Normal examined duodenum. - No specimens collected.   EGD 01/24/2020: - Z-line regular, 37 cm from the incisors. - Gastritis. Biopsied. - Normal duodenal bulb, first portion of the duodenum and second portion of the Duodenum   EGD 09/08/2015: - LA Grade B esophagitis. - Gastritis. Biopsied. - Normal duodenal bulb, first portion of the duodenum and second portion of the duodenum. - Suspect gastritis and esophagitis are function of the patient's recurrent nausea and vomiting, and less convinced that they are in any way linked causally to her symptoms.  Past Medical History:  Diagnosis Date   Acute H. pylori gastric ulcer    Coffee ground emesis    Diabetes mellitus ([place])    Diabetic gastroparesis ([place])    DKA (diabetic ketoacidosis) ([place]) 02/24/2021   Gastroparesis    GERD (gastroesophageal reflux disease)    Hypertension    Hyperthyroidism    Intractable nausea and vomiting 04/20/2021   Normocytic anemia 04/20/2020   Prolonged Q-T interval on ECG     Past Surgical History:  Procedure Laterality Date   AMPUTATION TOE Left 03/10/2018   Procedure: AMPUTATION FIFTH TOE;  Surgeon: Trula Slade, DPM;  Location: [facility];  Service: Podiatry;  Laterality: Left;   BIOPSY  01/28/2020   Procedure: BIOPSY;  Surgeon: Otis Brace, MD;  Location: [facility];  Service: Gastroenterology;;   BOTOX INJECTION  08/26/2021   Procedure: BOTOX INJECTION;  Surgeon: Doran Stabler, MD;  Location: [facility];  Service: Gastroenterology;;   ESOPHAGOGASTRODUODENOSCOPY N/A 01/28/2020   Procedure: ESOPHAGOGASTRODUODENOSCOPY  (EGD);  Surgeon: Otis Brace, MD;  Location: [facility];  Service: Gastroenterology;  Laterality: N/A;   ESOPHAGOGASTRODUODENOSCOPY (EGD) WITH PROPOFOL Left 09/08/2015   Procedure: ESOPHAGOGASTRODUODENOSCOPY (EGD) WITH PROPOFOL;  Surgeon: Arta Silence, MD;  Location: [facility];  Service: Endoscopy;  Laterality: Left;   ESOPHAGOGASTRODUODENOSCOPY (EGD) WITH PROPOFOL N/A 08/26/2021   Procedure: ESOPHAGOGASTRODUODENOSCOPY (EGD) WITH PROPOFOL;  Surgeon: Doran Stabler, MD;  Location: [facility];  Service: Gastroenterology;  Laterality: N/A;   WISDOM TOOTH EXTRACTION      Family History  Problem Relation Age of Onset   Lung cancer Mother    Diabetes Mother    Bipolar disorder Father    Liver cancer Maternal Grandfather    Diabetes Maternal Aunt        x 2   Pancreatic cancer Maternal Uncle    Prostate cancer Maternal Uncle    Breast cancer Other        maternal great aunt   Heart disease Other    Ovarian cancer Other        maternal great aunt   Kidney disease Other        maternal great aunt   Colon cancer Neg Hx    Stomach cancer Neg Hx      Social History   Tobacco Use   Smoking status: Never   Smokeless tobacco: Never  Vaping Use   Vaping Use: Never used  Substance Use Topics   Alcohol use: Yes    Alcohol/week: 1.0 standard drink of alcohol    Types: 1 Shots of liquor per week    Comment: occasional    Drug use: No    Prior to Admission medications   Medication Sig Start Date End Date Taking? Authorizing Provider  atorvastatin (LIPITOR) 20 MG tablet Take 1 tablet (20 mg total) by mouth daily. 10/29/21   [name], MD  carvedilol (COREG) 12.5 MG tablet Take 1 tablet (12.5 mg total) by mouth 2 (two) times daily with a meal. Patient taking differently: Take 6.25 mg by mouth 2 (two) times daily with a meal. 11/08/21   Hosie Poisson, MD  Continuous Blood Gluc Sensor (DEXCOM G6 SENSOR) MISC Inject 1 Device into the skin See admin instructions. Place 1  sensor into the skin every 10 days after removing the former one    [provider]  Continuous Blood Gluc Transmit (DEXCOM G6 TRANSMITTER) MISC Change every 90 days 11/29/21   Shamleffer, Melanie Crazier, MD  dicyclomine (BENTYL) 20 MG tablet Take 1 tablet (20 mg total) by mouth 3 (three) times daily as needed for spasms. 11/08/21   Hosie Poisson, MD  gabapentin (NEURONTIN) 300 MG capsule Take 300 mg by mouth 3 (three) times daily. 10/24/21   [provider]  hydrOXYzine (VISTARIL) 25 MG capsule Take 1 capsule (25 mg total) by mouth every 8 (eight) hours as needed. Patient taking differently: Take 25 mg by mouth every 8 (eight) hours as needed for anxiety. 07/31/21   Georgette Shell, MD  insulin detemir (LEVEMIR FLEXTOUCH) 100 UNIT/ML FlexPen Inject 16 Units into the skin daily. Patient taking differently: Inject 16 Units into the skin daily before breakfast. 06/14/21   Shelly Coss, MD  insulin regular (NOVOLIN R) 100 units/mL injection Inject 0-0.15 mLs (0-15 Units total) into the skin See admin instructions. Via pump.Start this medication after you follow up with your PCP Patient taking differently: Inject 0-15 Units into the skin See admin instructions. Inject 0-15 units into the skin three times a day with meals, per sliding scale 06/14/21   Shelly Coss, MD  metoCLOPramide (REGLAN) 10 MG tablet Take 1 tablet (10 mg total) by mouth every 8 (eight) hours as needed for nausea or vomiting. Patient taking differently: Take 10 mg by mouth 3 (three) times daily before meals. 11/08/21   Hosie Poisson, MD  ondansetron (ZOFRAN) 4 MG tablet Take 4 mg by mouth every 8 (eight) hours as needed for nausea or vomiting.    [provider]  pantoprazole (PROTONIX) 40 MG tablet Take 1 tablet (40 mg total) by mouth 2 (two) times daily before a meal. Patient taking differently: Take 40 mg by mouth daily before breakfast. 11/08/21   Hosie Poisson, MD  promethazine (PHENERGAN) 12.5 MG  tablet Take 1 tablet (12.5 mg total) by mouth every 6 (six) hours as needed for nausea or vomiting. 11/08/21   Hosie Poisson, MD  Prucalopride Succinate (MOTEGRITY) 2 MG TABS Take 1 tablet (2 mg total) by mouth daily. Patient not taking: Reported on 12/02/2021 11/08/21   Hosie Poisson, MD  TURMERIC PO Take 1 capsule by mouth daily with breakfast.    [provider]  insulin aspart (NOVOLOG) 100 UNIT/ML FlexPen Inject 5 Units into the skin 3 (three) times daily with meals. 02/23/18 07/05/19  Donne Hazel, MD    Current Facility-Administered Medications  Medication Dose Route Frequency Provider Last Rate Last Admin   acetaminophen (TYLENOL) tablet 650 mg  650 mg Oral Q6H PRN Reubin Milan, MD       Or   acetaminophen (TYLENOL) suppository 650 mg  650 mg Rectal Q6H PRN Reubin Milan, MD       metoCLOPramide [facility]) injection 10 mg  10 mg Intravenous Q6H Reubin Milan, MD       ondansetron [facility]) tablet 4 mg  4 mg Oral Q6H PRN Reubin Milan, MD       Or   ondansetron [facility]) injection 4 mg  4 mg Intravenous Q6H PRN Reubin Milan, MD       pantoprazole (PROTONIX) injection 40 mg  40 mg Intravenous Q12H Reubin Milan, MD       Current Outpatient Medications  Medication Sig Dispense Refill   atorvastatin (LIPITOR) 20 MG tablet Take 1 tablet (20 mg total) by mouth daily. 90 tablet 2   carvedilol (COREG) 12.5 MG tablet Take 1 tablet (12.5 mg total) by mouth 2 (two) times daily with a meal. (Patient taking differently: Take 6.25 mg by mouth 2 (two) times daily with a meal.) 60 tablet 2   Continuous Blood Gluc Sensor (DEXCOM G6 SENSOR) MISC Inject 1 Device into the skin See admin instructions. Place 1 sensor into the skin every 10 days after removing the former one     Continuous Blood Gluc Transmit (DEXCOM G6 TRANSMITTER) MISC Change every 90 days 1 each 3   dicyclomine (BENTYL) 20 MG tablet Take 1 tablet (20 mg total) by mouth 3 (three) times daily as  needed for spasms. 30 tablet 0   gabapentin (NEURONTIN) 300 MG capsule Take 300 mg by mouth 3 (three) times daily.     hydrOXYzine (VISTARIL) 25 MG capsule Take 1 capsule (25 mg total) by  mouth every 8 (eight) hours as needed. (Patient taking differently: Take 25 mg by mouth every 8 (eight) hours as needed for anxiety.) 30 capsule 0   insulin detemir (LEVEMIR FLEXTOUCH) 100 UNIT/ML FlexPen Inject 16 Units into the skin daily. (Patient taking differently: Inject 16 Units into the skin daily before breakfast.) 10 mL 0   insulin regular (NOVOLIN R) 100 units/mL injection Inject 0-0.15 mLs (0-15 Units total) into the skin See admin instructions. Via pump.Start this medication after you follow up with your PCP (Patient taking differently: Inject 0-15 Units into the skin See admin instructions. Inject 0-15 units into the skin three times a day with meals, per sliding scale) 30 mL 1   metoCLOPramide (REGLAN) 10 MG tablet Take 1 tablet (10 mg total) by mouth every 8 (eight) hours as needed for nausea or vomiting. (Patient taking differently: Take 10 mg by mouth 3 (three) times daily before meals.) 30 tablet 0   ondansetron (ZOFRAN) 4 MG tablet Take 4 mg by mouth every 8 (eight) hours as needed for nausea or vomiting.     pantoprazole (PROTONIX) 40 MG tablet Take 1 tablet (40 mg total) by mouth 2 (two) times daily before a meal. (Patient taking differently: Take 40 mg by mouth daily before breakfast.) 60 tablet 5   promethazine (PHENERGAN) 12.5 MG tablet Take 1 tablet (12.5 mg total) by mouth every 6 (six) hours as needed for nausea or vomiting. 30 tablet 0   Prucalopride Succinate (MOTEGRITY) 2 MG TABS Take 1 tablet (2 mg total) by mouth daily. (Patient not taking: Reported on 12/02/2021) 30 tablet 0   TURMERIC PO Take 1 capsule by mouth daily with breakfast.      Allergies as of 12/07/2021   (No Known Allergies)     Review of Systems:    As per HPI, otherwise negative    Physical Exam:  Vital signs  in last 24 hours: Temp:  [98.9 F (37.2 C)-99.3 F (37.4 C)] 99.3 F (37.4 C) (10/21 0812) Pulse Rate:  [117-129] 122 (10/21 0900) Resp:  [10-20] 12 (10/21 0900) BP: (147-200)/(105-137) 172/116 (10/21 0900) SpO2:  [97 %-100 %] 97 % (10/21 0900) Weight:  [87 kg] 87 kg (10/21 0035)   General:   Pleasant female in NAD Lungs:  Respirations even and unlabored. Lungs clear to auscultation bilaterally.   No wheezes, crackles, or rhonchi.  Heart: Sinus tachycardia.  No MRG Abdomen:  Soft, nondistended, nontender. Normal bowel sounds. No appreciable masses or hepatomegaly.  Rectal:  Not performed.  Psych:  Alert and cooperative. Normal affect.  LAB RESULTS: Recent Labs    12/07/21 0105  WBC 7.4  HGB 12.3  HCT 38.8  PLT 380   BMET Recent Labs    12/04/21 2055 12/05/21 0517 12/07/21 0105  NA 135 135 138  K 5.6* 4.8 4.5  CL 108 108 109  CO2 20* 23 20*  GLUCOSE 209* 214* 313*  BUN 15 16 18   CREATININE 0.99 0.92 1.22*  CALCIUM 9.4 9.4 9.6   LFT Recent Labs    12/07/21 0105  PROT 7.4  ALBUMIN 3.5  AST 18  ALT 18  ALKPHOS 86  BILITOT 0.8   PT/INR No results for input(s): "LABPROT", "INR" in the last 72 hours.  STUDIES: No results found.     Impression / Plan:   1) Diabetic gastroparesis 2) Nausea/Vomiting 3) Generalized abdominal pain 4) Tachycardia 31 year old female with type 1 diabetes complicated by gastroparesis with multiple hospital admissions for exacerbations with nausea/vomiting. -  Continue IV fluid - Scheduled Reglan 10 mg IV every 6 hours - Compazine and Zofran prn breakthrough nausea/vomiting - Daily EKG with QTc check for now - Pain management per primary service.  I explained that pain medications can certainly exacerbate her symptoms, but she was not interested in those thoughts and only requesting Dilaudid - Clear liquid diet okay.  When tolerating, increase to low-fat, low fiber diet - Hopefully can get her symptoms turnaround quick enough  so she can keep her appointment with GI in [place] on 12/09/2021 for evaluation of gastric pacemaker placement - Daily BMP - Would hold off on consultation for venting PEG tube given possibility of gastric pacemaker placement - Motegrity as an outpatient would likely be beneficial, but has been denied by insurance  5) GERD - Resume IV PPI until tolerating p.o. then convert back to Protonix p.o.   Gerrit Heck, DO, [facility]    LOS: 0 days   Lavena Bullion  12/07/2021, 9:16 AM

## 2021-12-07 NOTE — ED Provider Notes (Signed)
[facility]-EMERGENCY DEPT Provider Note  CSN: [id number] Arrival date & time: 12/07/21 4098  Chief Complaint(s) Abdominal Pain  HPI Kelli Hudson is a 31 y.o. female with a past medical history listed below including hypertension, diabetes with confirmed diabetic gastroparesis who presents to the emergency department for exacerbation of gastroparesis since this afternoon with several bouts of coffee-ground emesis.  She has generalized abdominal discomfort related to the emesis.  Has not been able to tolerate p.o. intake.  Denies alcohol or illicit drug use.  States that she has not been able to keep her meds down.  No fevers or chills.  No coughing or congestion.  No chest pain or shortness of breath.  No urinary symptoms.  No diarrhea.  No other physical complaints.  The history is provided by the patient.    Past Medical History Past Medical History:  Diagnosis Date   Acute H. pylori gastric ulcer    Coffee ground emesis    Diabetes mellitus (HCC)    Diabetic gastroparesis (HCC)    DKA (diabetic ketoacidosis) (HCC) 02/24/2021   Gastroparesis    GERD (gastroesophageal reflux disease)    Hypertension    Hyperthyroidism    Intractable nausea and vomiting 04/20/2021   Normocytic anemia 04/20/2020   Prolonged Q-T interval on ECG    Patient Active Problem List   Diagnosis Date Noted   Intractable nausea and vomiting 10/09/2021   Drug-seeking behavior 09/21/2021   Essential hypertension 08/25/2021   GERD without esophagitis 08/25/2021   Prolonged Q-T interval on ECG    Ulcerated, foot, left, with fat layer exposed (HCC) 04/20/2021   Uncontrolled type 1 diabetes mellitus with hyperglycemia, with long-term current use of insulin (HCC) 12/18/2020   DM type 1 (diabetes mellitus, type 1) (HCC) 12/18/2020   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) 11/09/2020   Diabetic retinopathy of both eyes associated with type 1 diabetes  mellitus (HCC) 09/24/2020   Diabetic gastroparesis associated with type 1 diabetes mellitus (HCC) 08/12/2020   Generalized abdominal pain 07/23/2020   Diabetic nephropathy associated with type 1 diabetes mellitus (HCC) 06/27/2020   HLD (hyperlipidemia) 05/23/2020   Sinus tachycardia 05/09/2020   Normocytic anemia 04/20/2020   Depression with anxiety 07/05/2019   Diabetic polyneuropathy associated with type 1 diabetes mellitus (HCC) 09/24/2017   Gastroparesis    Goiter 10/05/2009   Hypertension associated with diabetes (HCC) 10/05/2009   Home Medication(s) Prior to Admission medications   Medication Sig Start Date End Date Taking? Authorizing Provider  atorvastatin (LIPITOR) 20 MG tablet Take 1 tablet (20 mg total) by mouth daily. 10/29/21   Loyola Mast, MD  carvedilol (COREG) 12.5 MG tablet Take 1 tablet (12.5 mg total) by mouth 2 (two) times daily with a meal. Patient taking differently: Take 6.25 mg by mouth 2 (two) times daily with a meal. 11/08/21   Kathlen Mody, MD  Continuous Blood Gluc Sensor (DEXCOM G6 SENSOR) MISC Inject 1 Device into the skin See admin instructions. Place 1 sensor into the skin every 10 days after removing the former one    [provider]  Continuous Blood Gluc Transmit (DEXCOM G6 TRANSMITTER) MISC Change every 90 days 11/29/21   Shamleffer, Konrad Dolores, MD  dicyclomine (BENTYL) 20 MG tablet Take 1 tablet (20 mg total) by mouth 3 (three) times daily as needed for spasms. 11/08/21   Kathlen Mody, MD  gabapentin (NEURONTIN) 300 MG capsule Take 300 mg by mouth 3 (three) times daily. 10/24/21  [provider]  hydrOXYzine (VISTARIL) 25 MG capsule Take 1 capsule (25 mg total) by mouth every 8 (eight) hours as needed. Patient taking differently: Take 25 mg by mouth every 8 (eight) hours as needed for anxiety. 07/31/21   Alwyn Ren, MD  insulin detemir (LEVEMIR FLEXTOUCH) 100 UNIT/ML FlexPen Inject 16 Units into the skin daily. Patient  taking differently: Inject 16 Units into the skin daily before breakfast. 06/14/21   Burnadette Pop, MD  insulin regular (NOVOLIN R) 100 units/mL injection Inject 0-0.15 mLs (0-15 Units total) into the skin See admin instructions. Via pump.Start this medication after you follow up with your PCP Patient taking differently: Inject 0-15 Units into the skin See admin instructions. Inject 0-15 units into the skin three times a day with meals, per sliding scale 06/14/21   Burnadette Pop, MD  metoCLOPramide (REGLAN) 10 MG tablet Take 1 tablet (10 mg total) by mouth every 8 (eight) hours as needed for nausea or vomiting. Patient taking differently: Take 10 mg by mouth 3 (three) times daily before meals. 11/08/21   Kathlen Mody, MD  ondansetron (ZOFRAN) 4 MG tablet Take 4 mg by mouth every 8 (eight) hours as needed for nausea or vomiting.    [provider]  pantoprazole (PROTONIX) 40 MG tablet Take 1 tablet (40 mg total) by mouth 2 (two) times daily before a meal. Patient taking differently: Take 40 mg by mouth daily before breakfast. 11/08/21   Kathlen Mody, MD  promethazine (PHENERGAN) 12.5 MG tablet Take 1 tablet (12.5 mg total) by mouth every 6 (six) hours as needed for nausea or vomiting. 11/08/21   Kathlen Mody, MD  Prucalopride Succinate (MOTEGRITY) 2 MG TABS Take 1 tablet (2 mg total) by mouth daily. Patient not taking: Reported on 12/02/2021 11/08/21   Kathlen Mody, MD  TURMERIC PO Take 1 capsule by mouth daily with breakfast.    [provider]  insulin aspart (NOVOLOG) 100 UNIT/ML FlexPen Inject 5 Units into the skin 3 (three) times daily with meals. 02/23/18 07/05/19  Jerald Kief, MD                                                                                                                                    Allergies Patient has no known allergies.  Review of Systems Review of Systems As noted in HPI  Physical Exam Vital Signs  I have reviewed the triage vital  signs BP (!) 182/120   Pulse (!) 125   Temp 98.9 F (37.2 C) (Oral)   Resp 15   Ht 5\' 9"  (1.753 m)   Wt 87 kg   SpO2 99%   BMI 28.32 kg/m   Physical Exam Vitals reviewed.  Constitutional:      General: She is not in acute distress.    Appearance: She is well-developed. She is not diaphoretic.  HENT:     Head: Normocephalic and atraumatic.  Nose: Nose normal.  Eyes:     General: No scleral icterus.       Right eye: No discharge.        Left eye: No discharge.     Conjunctiva/sclera: Conjunctivae normal.     Pupils: Pupils are equal, round, and reactive to light.  Cardiovascular:     Rate and Rhythm: Normal rate and regular rhythm.     Heart sounds: No murmur heard.    No friction rub. No gallop.  Pulmonary:     Effort: Pulmonary effort is normal. No respiratory distress.     Breath sounds: Normal breath sounds. No stridor. No rales.  Abdominal:     General: There is no distension.     Palpations: Abdomen is soft.     Tenderness: There is abdominal tenderness (discomfort) in the epigastric area. There is no guarding or rebound.  Musculoskeletal:        General: No tenderness.     Cervical back: Normal range of motion and neck supple.  Skin:    General: Skin is warm and dry.     Findings: No erythema or rash.  Neurological:     Mental Status: She is alert and oriented to person, place, and time.     ED Results and Treatments Labs (all labs ordered are listed, but only abnormal results are displayed) Labs Reviewed  COMPREHENSIVE METABOLIC PANEL - Abnormal; Notable for the following components:      Result Value   CO2 20 (*)    Glucose, Bld 313 (*)    Creatinine, Ser 1.22 (*)    All other components within normal limits  CBG MONITORING, ED - Abnormal; Notable for the following components:   Glucose-Capillary 336 (*)    All other components within normal limits  LIPASE, BLOOD  CBC  URINALYSIS, ROUTINE W REFLEX MICROSCOPIC  I-STAT BETA HCG BLOOD, ED ([facility],  [facility], [facility] ONLY)                                                                                                                         EKG  EKG Interpretation  Date/Time:  Saturday December 07 2021 06:03:20 EDT Ventricular Rate:  132 PR Interval:  151 QRS Duration: 73 QT Interval:  270 QTC Calculation: 400 R Axis:   70 Text Interpretation: Sinus tachycardia LAE, consider biatrial enlargement Borderline repolarization abnormality Confirmed by Drema Pryardama, Jaonna Word [phone]([id number]) on 12/07/2021 6:13:26 AM       Radiology No results found.  Medications Ordered in ED Medications  sodium chloride 0.9 % bolus 1,000 mL (1,000 mLs Intravenous New Bag/Given 12/07/21 [id number])    Followed by  sodium chloride 0.9 % bolus 1,000 mL (0 mLs Intravenous Stopped 12/07/21 0640)    Followed by  0.9 %  sodium chloride infusion (has no administration in time range)  metoCLOPramide (REGLAN) injection 10 mg (has no administration in time range)  pantoprazole (PROTONIX) injection 40 mg (has no administration in time range)  labetalol (NORMODYNE)  injection 20 mg (has no administration in time range)  ketorolac (TORADOL) 15 MG/ML injection 15 mg (15 mg Intravenous Given 12/07/21 0558)  haloperidol lactate (HALDOL) injection 5 mg (5 mg Intravenous Given 12/07/21 0555)  diphenhydrAMINE (BENADRYL) injection 12.5 mg (12.5 mg Intravenous Given 12/07/21 0557)  hyoscyamine (LEVSIN) tablet 0.25 mg (0.25 mg Oral Given 12/07/21 0553)                                                                                                                                     Procedures .Critical Care  Performed by: Nira Conn, MD Authorized by: Nira Conn, MD   Critical care provider statement:    Critical care time (minutes):  45   Critical care time was exclusive of:  Separately billable procedures and treating other patients   Critical care was necessary to treat or prevent imminent or life-threatening  deterioration of the following conditions:  Dehydration   Critical care was time spent personally by me on the following activities:  Development of treatment plan with patient or surrogate, discussions with consultants, evaluation of patient's response to treatment, examination of patient, obtaining history from patient or surrogate, review of old charts, re-evaluation of patient's condition, pulse oximetry, ordering and review of radiographic studies, ordering and review of laboratory studies and ordering and performing treatments and interventions   (including critical care time)  Medical Decision Making / ED Course   Medical Decision Making Amount and/or Complexity of Data Reviewed Labs: ordered. Decision-making details documented in ED Course. ECG/medicine tests: ordered and independent interpretation performed. Decision-making details documented in ED Course.  Risk Prescription drug management. Parenteral controlled substances. Drug therapy requiring intensive monitoring for toxicity. Decision regarding hospitalization.    Complexity of Problem:  Patient's presenting problem/concern, DDX, and MDM listed below: Abdominal discomfort with nausea and vomiting Consistent with exacerbation of gastroparesis We will need to assess for any electrolyte/metabolic derangements including DKA.  Will assess for renal insufficiency, biliary disease or pancreatitis. Will rule out pregnancy related process   Initial Intervention:  IV fluids, Levsin, Haldol and low-dose IV Benadryl.    Complexity of Data:   Cardiac Monitoring: Sinus tachycardia with rates in the 120s to 130s EKG with sinus tachycardia, no dysrhythmias.  Laboratory Tests ordered listed below with my independent interpretation: CBC without leukocytosis or anemia Metabolic panel without significant electrolyte derangements.  Mild renal insufficiency without AKI.  Hyperglycemia without evidence of DKA. No biliary obstruction  or pancreatitis Beta-hCG negative.       ED Course:    Assessment, Add'l Intervention, and Reassessment: Gastroparesis flare Patient is nausea significantly improved after treatment above. She has been resting comfortably since getting the meds On reassessment  patient awoke stating that she needed more medicine for pain Patient still tachycardic and finishing up her IV fluids We will provide patient with dose of Reglan, Protonix and labetalol  Plan to reassess.  May require  admission.  Could be sent home with patient improves and is able to tolerate p.o. Patient care turned over to oncoming provider. Patient case and results discussed in detail; please see their note for further ED managment.        Final Clinical Impression(s) / ED Diagnoses Final diagnoses:  Gastroparesis due to secondary diabetes ([place])  Nausea and vomiting, unspecified vomiting type           This chart was dictated using voice recognition software.  Despite best efforts to proofread,  errors can occur which can change the documentation meaning.    Fatima Blank, MD 12/07/21 [phone]

## 2021-12-07 NOTE — H&P (Signed)
History and Physical    Patient: Kelli Hudson FUX:[id number] DOB: [birth year] DOA: 12/07/2021 DOS: the patient was seen and examined on 12/07/2021 PCP: Loyola Mast, MD  Patient coming from: Home  Chief Complaint:  Chief Complaint  Patient presents with   Abdominal Pain   HPI: Kelli Hudson is a 31 y.o. female with medical history significant gastric ulcer due to H. pylori, history of coffee-ground emesis, GERD, type 1 diabetes, diabetic gastroparesis, history of DKA hypertension, hypothyroidism, normocytic anemia, history of prolonged QT interval on EKG who was recently admitted and discharged for from 12/01/2021 until 12/05/2021 due to gastroparesis.  She returns today with recurrence of abdominal pain, more than 20 episodes of emesis including several with coffee-grounds.  No diarrhea, constipation, melena or hematochezia.  Her last BM was yesterday.  No flank pain, dysuria, frequency or hematuria.  She is scheduled to get a gastric pacemaker placed in [place] in 2 days.  No fever, chills or night sweats. No sore throat, rhinorrhea, dyspnea, wheezing or hemoptysis.  No chest pain, palpitations, diaphoresis, PND, orthopnea or pitting edema of the lower extremities.  No polyuria, polydipsia, polyphagia or blurred vision.  ED course: Initial vital signs were temperature 99 F, pulse 118, respiration 19, BP 147/105 mmHg O2 sat 100% on room air.  The patient received Benadryl 12.5 mg IVP, Haldol 5 mg IVP, hydromorphone 1 mg IVP, Levsin 0.25 mg orally, ketorolac 15 mg IVP x1, labetalol 20 mg IVP x1, lorazepam 1 mg IVP x1, metoclopramide 10 mg IVP x1, pantoprazole 40 mg IVP x1 and 2000 mL of normal saline bolus.  I added sodium chloride 0.45% bolus due to hypotension, magnesium sulfate 2 g IVPB, metoprolol 5 mg IVP and prochlorperazine 10 mg IVP.  Lab work: Her urinalysis with glucosuria more than 500 mg deciliter, moderate hemoglobinuria, ketonuria 15 and proteinuria  more than 300 mg/dL.  There were rare bacteria on microscopic examination.  Initial hemoglobin was 12.3 on a normal CBC, follow-up hemoglobin has been 11.3 and then 9.9 g/dL.  CMP showed a CO2 of 20 mmol/L, glucose of 313 and creatinine 1.22 mg/dL.  Lipase normal and i-STAT hCG negative.   Review of Systems: As mentioned in the history of present illness. All other systems reviewed and are negative.  Past Medical History:  Diagnosis Date   Acute H. pylori gastric ulcer    Coffee ground emesis    Diabetes mellitus (HCC)    Diabetic gastroparesis (HCC)    DKA (diabetic ketoacidosis) (HCC) 02/24/2021   Gastroparesis    GERD (gastroesophageal reflux disease)    Hypertension    Hyperthyroidism    Intractable nausea and vomiting 04/20/2021   Normocytic anemia 04/20/2020   Prolonged Q-T interval on ECG    Past Surgical History:  Procedure Laterality Date   AMPUTATION TOE Left 03/10/2018   Procedure: AMPUTATION FIFTH TOE;  Surgeon: Vivi Barrack, DPM;  Location: [facility];  Service: Podiatry;  Laterality: Left;   BIOPSY  01/28/2020   Procedure: BIOPSY;  Surgeon: Kathi Der, MD;  Location: [facility];  Service: Gastroenterology;;   BOTOX INJECTION  08/26/2021   Procedure: BOTOX INJECTION;  Surgeon: Sherrilyn Rist, MD;  Location: [facility];  Service: Gastroenterology;;   ESOPHAGOGASTRODUODENOSCOPY N/A 01/28/2020   Procedure: ESOPHAGOGASTRODUODENOSCOPY (EGD);  Surgeon: Kathi Der, MD;  Location: [facility];  Service: Gastroenterology;  Laterality: N/A;   ESOPHAGOGASTRODUODENOSCOPY (EGD) WITH PROPOFOL Left 09/08/2015   Procedure: ESOPHAGOGASTRODUODENOSCOPY (EGD) WITH PROPOFOL;  Surgeon: Willis Modena, MD;  Location: [facility];  Service: Endoscopy;  Laterality: Left;   ESOPHAGOGASTRODUODENOSCOPY (EGD) WITH PROPOFOL N/A 08/26/2021   Procedure: ESOPHAGOGASTRODUODENOSCOPY (EGD) WITH PROPOFOL;  Surgeon: Sherrilyn Rist, MD;  Location: [facility];   Service: Gastroenterology;  Laterality: N/A;   WISDOM TOOTH EXTRACTION     Social History:  reports that she has never smoked. She has never used smokeless tobacco. She reports current alcohol use of about 1.0 standard drink of alcohol per week. She reports that she does not use drugs.  No Known Allergies  Family History  Problem Relation Age of Onset   Lung cancer Mother    Diabetes Mother    Bipolar disorder Father    Liver cancer Maternal Grandfather    Diabetes Maternal Aunt        x 2   Pancreatic cancer Maternal Uncle    Prostate cancer Maternal Uncle    Breast cancer Other        maternal great aunt   Heart disease Other    Ovarian cancer Other        maternal great aunt   Kidney disease Other        maternal great aunt   Colon cancer Neg Hx    Stomach cancer Neg Hx     Prior to Admission medications   Medication Sig Start Date End Date Taking? Authorizing Provider  atorvastatin (LIPITOR) 20 MG tablet Take 1 tablet (20 mg total) by mouth daily. 10/29/21   Loyola Mast, MD  carvedilol (COREG) 12.5 MG tablet Take 1 tablet (12.5 mg total) by mouth 2 (two) times daily with a meal. Patient taking differently: Take 6.25 mg by mouth 2 (two) times daily with a meal. 11/08/21   Kathlen Mody, MD  Continuous Blood Gluc Sensor (DEXCOM G6 SENSOR) MISC Inject 1 Device into the skin See admin instructions. Place 1 sensor into the skin every 10 days after removing the former one    [provider]  Continuous Blood Gluc Transmit (DEXCOM G6 TRANSMITTER) MISC Change every 90 days 11/29/21   Shamleffer, Konrad Dolores, MD  dicyclomine (BENTYL) 20 MG tablet Take 1 tablet (20 mg total) by mouth 3 (three) times daily as needed for spasms. 11/08/21   Kathlen Mody, MD  gabapentin (NEURONTIN) 300 MG capsule Take 300 mg by mouth 3 (three) times daily. 10/24/21   [provider]  hydrOXYzine (VISTARIL) 25 MG capsule Take 1 capsule (25 mg total) by mouth every 8 (eight) hours as  needed. Patient taking differently: Take 25 mg by mouth every 8 (eight) hours as needed for anxiety. 07/31/21   Alwyn Ren, MD  insulin detemir (LEVEMIR FLEXTOUCH) 100 UNIT/ML FlexPen Inject 16 Units into the skin daily. Patient taking differently: Inject 16 Units into the skin daily before breakfast. 06/14/21   Burnadette Pop, MD  insulin regular (NOVOLIN R) 100 units/mL injection Inject 0-0.15 mLs (0-15 Units total) into the skin See admin instructions. Via pump.Start this medication after you follow up with your PCP Patient taking differently: Inject 0-15 Units into the skin See admin instructions. Inject 0-15 units into the skin three times a day with meals, per sliding scale 06/14/21   Burnadette Pop, MD  metoCLOPramide (REGLAN) 10 MG tablet Take 1 tablet (10 mg total) by mouth every 8 (eight) hours as needed for nausea or vomiting. Patient taking differently: Take 10 mg by mouth 3 (three) times daily before meals. 11/08/21   Kathlen Mody, MD  ondansetron (ZOFRAN) 4 MG tablet Take 4  mg by mouth every 8 (eight) hours as needed for nausea or vomiting.    [provider]  pantoprazole (PROTONIX) 40 MG tablet Take 1 tablet (40 mg total) by mouth 2 (two) times daily before a meal. Patient taking differently: Take 40 mg by mouth daily before breakfast. 11/08/21   Kathlen Mody, MD  promethazine (PHENERGAN) 12.5 MG tablet Take 1 tablet (12.5 mg total) by mouth every 6 (six) hours as needed for nausea or vomiting. 11/08/21   Kathlen Mody, MD  Prucalopride Succinate (MOTEGRITY) 2 MG TABS Take 1 tablet (2 mg total) by mouth daily. Patient not taking: Reported on 12/02/2021 11/08/21   Kathlen Mody, MD  TURMERIC PO Take 1 capsule by mouth daily with breakfast.    [provider]  insulin aspart (NOVOLOG) 100 UNIT/ML FlexPen Inject 5 Units into the skin 3 (three) times daily with meals. 02/23/18 07/05/19  Jerald Kief, MD    Physical Exam: Vitals:   12/07/21 0630 12/07/21 0700  12/07/21 0720 12/07/21 0812  BP: (!) 188/111 (!) 182/120 (!) 193/131 (!) 186/123  Pulse: (!) 128 (!) 125 (!) 117 (!) 119  Resp: 11 15 10 16   Temp:    99.3 F (37.4 C)  TempSrc:    Oral  SpO2: 99% 99% 100% 100%  Weight:      Height:       Physical Exam Vitals and nursing note reviewed.  Constitutional:      General: She is awake. She is not in acute distress.    Appearance: She is well-developed.  HENT:     Head: Normocephalic.     Nose: No rhinorrhea.     Mouth/Throat:     Mouth: Mucous membranes are dry.  Eyes:     General: No scleral icterus.    Pupils: Pupils are equal, round, and reactive to light.  Neck:     Vascular: No JVD.  Cardiovascular:     Rate and Rhythm: Regular rhythm. Tachycardia present.     Heart sounds: S1 normal and S2 normal.  Pulmonary:     Effort: Pulmonary effort is normal.     Breath sounds: No wheezing, rhonchi or rales.  Abdominal:     Tenderness: There is abdominal tenderness in the epigastric area. There is no guarding or rebound.  Musculoskeletal:     Cervical back: Neck supple.     Right lower leg: No edema.     Left lower leg: No edema.  Skin:    General: Skin is warm and dry.  Neurological:     General: No focal deficit present.     Mental Status: She is alert and oriented to person, place, and time.  Psychiatric:        Mood and Affect: Mood is anxious.        Behavior: Behavior is cooperative.   Data Reviewed:  There are no new results to review at this time.  Assessment and Plan: Principal Problem:   Nausea and vomiting (hematemesis) In the setting of:   Diabetic gastroparesis (HCC) Admit to telemetry/inpatient. Keep NPO for now. Continue IV fluids. Continue pantoprazole IV twice daily. Metoclopramide 10 mg every 6 hours. Ondansetron Prochlorperazine as needed. Monitor H&H. Transfuse as needed. GI consult greatly appreciated. History of QT prolongation. QT is normal now. Follow-up EKG in AM. Keep electrolytes  optimized.  Active Problems:   Uncontrolled type 1 diabetes mellitus with hyperglycemia,      with long-term current use of insulin (HCC) Continue basal insulin. CBG  monitoring every 6 hours with RI SS.    Hypomagnesemia Replacement given. Follow-up level as needed.    Sinus tachycardia Dehydration. Beta-blocker involuntary discontinuation. Metoprolol IV as needed while NPO. Monitor heart rate.    Essential hypertension Metoprolol 5 mg every 8 hours ordered. We will hold as blood pressures are softer now. Resume carvedilol once cleared for oral intake.    GERD without esophagitis On parenteral PPI.    HLD (hyperlipidemia) Resume statin once on diet.     Advance Care Planning:   Code Status: Full Code   Consults: Gastroenterology (Dr. Bryan Lemma).  Family Communication:   Severity of Illness: The appropriate patient status for this patient is OBSERVATION. Observation status is judged to be reasonable and necessary in order to provide the required intensity of service to ensure the patient's safety. The patient's presenting symptoms, physical exam findings, and initial radiographic and laboratory data in the context of their medical condition is felt to place them at decreased risk for further clinical deterioration. Furthermore, it is anticipated that the patient will be medically stable for discharge from the hospital within 2 midnights of admission.   Author: Reubin Milan, MD 12/07/2021 8:52 AM  For on call review [email or web address].   This document was prepared using Dragon voice recognition software and may contain some unintended transcription errors.

## 2021-12-07 NOTE — ED Triage Notes (Signed)
Reports n/v and abd pain that started this afternoon, hx of gastroparesis

## 2021-12-07 NOTE — Plan of Care (Signed)

## 2021-12-07 NOTE — ED Provider Notes (Signed)
Patient here with nausea and vomiting.  History of gastroparesis, diabetes.  She is not in DKA.  She still tachycardic in the 120s despite fluid and multiple antiemetics.  Not feeling better after multiple treatments.  Will admit for further care.  IV fluids started.  IV Ativan given for further nausea and vomiting.  This chart was dictated using voice recognition software.  Despite best efforts to proofread,  errors can occur which can change the documentation meaning.    Lennice Sites, DO 12/07/21 [phone]

## 2021-12-08 DIAGNOSIS — Z833 Family history of diabetes mellitus: Secondary | ICD-10-CM | POA: Diagnosis not present

## 2021-12-08 DIAGNOSIS — E860 Dehydration: Secondary | ICD-10-CM | POA: Diagnosis not present

## 2021-12-08 DIAGNOSIS — E785 Hyperlipidemia, unspecified: Secondary | ICD-10-CM | POA: Diagnosis not present

## 2021-12-08 DIAGNOSIS — R112 Nausea with vomiting, unspecified: Secondary | ICD-10-CM

## 2021-12-08 DIAGNOSIS — E039 Hypothyroidism, unspecified: Secondary | ICD-10-CM | POA: Diagnosis not present

## 2021-12-08 DIAGNOSIS — Z597 Insufficient social insurance and welfare support: Secondary | ICD-10-CM | POA: Diagnosis not present

## 2021-12-08 DIAGNOSIS — E1043 Type 1 diabetes mellitus with diabetic autonomic (poly)neuropathy: Secondary | ICD-10-CM | POA: Diagnosis not present

## 2021-12-08 DIAGNOSIS — D649 Anemia, unspecified: Secondary | ICD-10-CM | POA: Diagnosis not present

## 2021-12-08 DIAGNOSIS — R000 Tachycardia, unspecified: Secondary | ICD-10-CM | POA: Diagnosis not present

## 2021-12-08 DIAGNOSIS — Z8711 Personal history of peptic ulcer disease: Secondary | ICD-10-CM | POA: Diagnosis not present

## 2021-12-08 DIAGNOSIS — R809 Proteinuria, unspecified: Secondary | ICD-10-CM | POA: Diagnosis not present

## 2021-12-08 DIAGNOSIS — N179 Acute kidney failure, unspecified: Secondary | ICD-10-CM | POA: Diagnosis not present

## 2021-12-08 DIAGNOSIS — Z950 Presence of cardiac pacemaker: Secondary | ICD-10-CM | POA: Diagnosis not present

## 2021-12-08 DIAGNOSIS — Z79899 Other long term (current) drug therapy: Secondary | ICD-10-CM | POA: Diagnosis not present

## 2021-12-08 DIAGNOSIS — K219 Gastro-esophageal reflux disease without esophagitis: Secondary | ICD-10-CM | POA: Diagnosis not present

## 2021-12-08 DIAGNOSIS — E1143 Type 2 diabetes mellitus with diabetic autonomic (poly)neuropathy: Secondary | ICD-10-CM | POA: Diagnosis not present

## 2021-12-08 DIAGNOSIS — E0590 Thyrotoxicosis, unspecified without thyrotoxic crisis or storm: Secondary | ICD-10-CM | POA: Diagnosis not present

## 2021-12-08 DIAGNOSIS — Z89422 Acquired absence of other left toe(s): Secondary | ICD-10-CM | POA: Diagnosis not present

## 2021-12-08 DIAGNOSIS — I10 Essential (primary) hypertension: Secondary | ICD-10-CM | POA: Diagnosis not present

## 2021-12-08 DIAGNOSIS — E1065 Type 1 diabetes mellitus with hyperglycemia: Secondary | ICD-10-CM

## 2021-12-08 DIAGNOSIS — E871 Hypo-osmolality and hyponatremia: Secondary | ICD-10-CM | POA: Diagnosis not present

## 2021-12-08 DIAGNOSIS — K3184 Gastroparesis: Secondary | ICD-10-CM | POA: Diagnosis not present

## 2021-12-08 DIAGNOSIS — Z794 Long term (current) use of insulin: Secondary | ICD-10-CM | POA: Diagnosis not present

## 2021-12-08 DIAGNOSIS — R9431 Abnormal electrocardiogram [ECG] [EKG]: Secondary | ICD-10-CM | POA: Diagnosis not present

## 2021-12-08 DIAGNOSIS — F419 Anxiety disorder, unspecified: Secondary | ICD-10-CM | POA: Diagnosis not present

## 2021-12-08 LAB — TYPE AND SCREEN
ABO/RH(D): O POS
Antibody Screen: NEGATIVE

## 2021-12-08 LAB — COMPREHENSIVE METABOLIC PANEL
ALT: 13 U/L (ref 0–44)
AST: 14 U/L — ABNORMAL LOW (ref 15–41)
Albumin: 2.6 g/dL — ABNORMAL LOW (ref 3.5–5.0)
Alkaline Phosphatase: 67 U/L (ref 38–126)
Anion gap: 7 (ref 5–15)
BUN: 18 mg/dL (ref 6–20)
CO2: 21 mmol/L — ABNORMAL LOW (ref 22–32)
Calcium: 8.4 mg/dL — ABNORMAL LOW (ref 8.9–10.3)
Chloride: 106 mmol/L (ref 98–111)
Creatinine, Ser: 0.95 mg/dL (ref 0.44–1.00)
GFR, Estimated: >60 mL/min (ref >60)
Glucose, Bld: 236 mg/dL — ABNORMAL HIGH (ref 70–99)
Potassium: 3.8 mmol/L (ref 3.5–5.1)
Sodium: 134 mmol/L — ABNORMAL LOW (ref 135–145)
Total Bilirubin: 0.9 mg/dL (ref 0.3–1.2)
Total Protein: 5.5 g/dL — ABNORMAL LOW (ref 6.5–8.1)

## 2021-12-08 LAB — CBC
HCT: 29.8 % — ABNORMAL LOW (ref 36.0–46.0)
Hemoglobin: 9.7 g/dL — ABNORMAL LOW (ref 12.0–15.0)
MCH: 29.3 pg (ref 26.0–34.0)
MCHC: 32.6 g/dL (ref 30.0–36.0)
MCV: 90 fL (ref 80.0–100.0)
Platelets: 371 10*3/uL (ref 150–400)
RBC: 3.31 MIL/uL — ABNORMAL LOW (ref 3.87–5.11)
RDW: 13.7 % (ref 11.5–15.5)
WBC: 9.6 10*3/uL (ref 4.0–10.5)
nRBC: 0 % (ref 0.0–0.2)

## 2021-12-08 LAB — GLUCOSE, CAPILLARY
Glucose-Capillary: 217 mg/dL — ABNORMAL HIGH (ref 70–99)
Glucose-Capillary: 261 mg/dL — ABNORMAL HIGH (ref 70–99)
Glucose-Capillary: 217 mg/dL — ABNORMAL HIGH (ref 70–99)
Glucose-Capillary: 158 mg/dL — ABNORMAL HIGH (ref 70–99)

## 2021-12-08 MED ORDER — NITROGLYCERIN 0.4 MG SL SUBL: 0.4000 mg | SUBLINGUAL_TABLET | SUBLINGUAL | Status: DC | PRN

## 2021-12-08 MED ORDER — METOPROLOL TARTRATE 5 MG/5ML IV SOLN
2.5000 mg | Freq: Four times a day (QID) | INTRAVENOUS | Status: DC
  Administered 2021-12-08 – 2021-12-09 (×4): 2.5 mg via INTRAVENOUS
  Filled 2021-12-08 (×4): qty 5

## 2021-12-08 MED ORDER — HYDROMORPHONE HCL 1 MG/ML IJ SOLN
0.5000 mg | INTRAMUSCULAR | Status: AC | PRN
  Administered 2021-12-08 (×4): 0.5 mg via INTRAVENOUS
  Filled 2021-12-08 (×4): qty 0.5

## 2021-12-08 MED ORDER — HYDRALAZINE HCL 20 MG/ML IJ SOLN
10.0000 mg | Freq: Four times a day (QID) | INTRAMUSCULAR | Status: DC | PRN
  Administered 2021-12-08 – 2021-12-10 (×2): 10 mg via INTRAVENOUS
  Filled 2021-12-08 (×2): qty 1

## 2021-12-08 NOTE — Progress Notes (Signed)
   12/08/21 1403  Assess: MEWS Score  Temp 98.7 F (37.1 C)  BP (!) 179/110  MAP (mmHg) 130  Pulse Rate (!) 115  Resp 18  SpO2 99 %  O2 Device Room Air  Assess: MEWS Score  MEWS Temp 0  MEWS Systolic 0  MEWS Pulse 2  MEWS RR 0  MEWS LOC 0  MEWS Score 2  MEWS Score Color Yellow  Assess: if the MEWS score is Yellow or Red  Were vital signs taken at a resting state? Yes  Focused Assessment No change from prior assessment  Does the patient meet 2 or more of the SIRS criteria? No  MEWS guidelines implemented *See Row Information* Yes  Treat  MEWS Interventions Other (Comment) (contacted MD)  Take Vital Signs  Increase Vital Sign Frequency  Yellow: Q 2hr X 2 then Q 4hr X 2, if remains yellow, continue Q 4hrs  Escalate  MEWS: Escalate Yellow: discuss with charge nurse/RN and consider discussing with provider and RRT  Notify: Charge Nurse/RN  Name of Charge Nurse/RN Notified Erin Sons RN  Date Charge Nurse/RN Notified 12/08/21  Time Charge Nurse/RN Notified 64  Notify: Provider  Provider Name/Title Flora Lipps MD  Date Provider Notified 12/08/21  Time Provider Notified 1409  Method of Notification Page  Notification Reason Other (Comment)  Provider response See new orders  Date of Provider Response 12/08/21  Time of Provider Response 1437  Document  Patient Outcome Stabilized after interventions  Progress note created (see row info) Yes  Assess: SIRS CRITERIA  SIRS Temperature  0  SIRS Pulse 1  SIRS Respirations  0  SIRS WBC 1  SIRS Score Sum  2   Patient went into yellow MEWS due to sustained elevated heart rate and elevated blood pressure. Paged Dr. Louanne Belton. As needed hydralazine and scheduled metoprolol ordered. As needed hydralazine given. MEWS guidelines implemented.

## 2021-12-08 NOTE — Plan of Care (Signed)

## 2021-12-08 NOTE — Progress Notes (Addendum)
Kelli Hudson ROUNDING NOTE   Subjective: Some overall improvement since starting scheduled antiemetics.  Was able to tolerate broth for dinner last evening with some nausea overnight.  Liquid breakfast this morning but nonbloody emesis shortly afterwards.  Abdominal pain improved.  Labs reviewed.  Renal function improved with creatinine 0.9 (from 1.22).  Initially with 2 g decline in hemoglobin through first hospital day, but suspect the initial lab was representative of hemoconcentration.  H/H today more in line with recent baseline.  No overt bleeding.  Objective: Vital signs in last 24 hours: Temp:  [97.7 F (36.5 C)-99.2 F (37.3 C)] 98.1 F (36.7 C) (10/22 0653) Pulse Rate:  [103-124] 104 (10/22 0653) Resp:  [11-19] 18 (10/22 0653) BP: (91-167)/(55-114) 158/114 (10/22 0653) SpO2:  [97 %-100 %] 99 % (10/22 0653) Last BM Date : 12/06/21 General: NAD Ext:  No c/c/e   Intake/Output from previous day: 10/21 0701 - 10/22 0700 In: 2861.7 [I.V.:1211.7; IV Piggyback:1650] Out: -  Intake/Output this shift: No intake/output data recorded.   Lab Results: Recent Labs    12/07/21 0105 12/07/21 0851 12/07/21 1451 12/07/21 2040 12/08/21 0601  WBC 7.4  --   --   --  9.6  HGB 12.3   < > 9.9* 10.0* 9.7*  PLT 380  --   --   --  371  MCV 92.2  --   --   --  90.0   < > = values in this interval not displayed.   BMET Recent Labs    12/07/21 0105 12/08/21 0601  NA 138 134*  K 4.5 3.8  CL 109 106  CO2 20* 21*  GLUCOSE 313* 236*  BUN 18 18  CREATININE 1.22* 0.95  CALCIUM 9.6 8.4*   LFT Recent Labs    12/07/21 0105 12/08/21 0601  PROT 7.4 5.5*  ALBUMIN 3.5 2.6*  AST 18 14*  ALT 18 13  ALKPHOS 86 67  BILITOT 0.8 0.9   PT/INR No results for input(s): "INR" in the last 72 hours.    Imaging/Other results: No results found.    Assessment and Plan:  1) Diabetic gastroparesis 2) Nausea/Vomiting 3) Generalized abdominal pain-improved  31 year old  female with type 1 diabetes complicated by gastroparesis with multiple hospital admissions for exacerbations with nausea/vomiting. - Continue IV fluid until more reliably tolerating p.o. intake - Continue liquid diet and advance as tolerated.  Encouraged her to try 6 small meals daily.  Can add boost, Ensure, etc. for supplement in between normal mealtimes - Continue scheduled Reglan 10 mg IV every 6 hours - Compazine and Zofran prn breakthrough nausea/vomiting - Daily EKG with QTc check for now - Pain management per primary Hospitalist service.  Try to limit pain meds - Daily BMP check for now - Hold off on consultation for venting PEG tube given possibility of gastric pacemaker placement - Motegrity as an outpatient would likely be beneficial, but has been denied by insurance - No role for endoscopy - Currently scheduled for an appointment with Dr. Vickki Muff at [facility] in [place], [place] on 12/09/2021 for evaluation of gastric pacemaker placement.  However, does not appear that she would be getting discharged today, so I encouraged her to call tomorrow when the office opens to reschedule appointment  4) GERD - Continue IV PPI until tolerating p.o. then convert back to Protonix p.o.  5) AKI - Improved  6) Normocytic anemia Initially with 2 g decline in hemoglobin through first hospital day, but suspect the initial lab  was representative of hemoconcentration.  H/H today more in line with recent baseline.  No overt bleeding. - No role for endoscopy at this juncture - Daily CBC checks while inpatient    If she remains inpatient, Dr. Rhea Belton will assume her inpatient GI care starting tomorrow morning   Shellia Cleverly, DO  12/08/2021, 11:14 AM [facility] Pager [phone]

## 2021-12-08 NOTE — Progress Notes (Signed)
PROGRESS NOTE    Kelli Hudson  [id number] DOB: February 14, 1991 DOA: 12/07/2021 PCP: [name], MD    Brief Narrative:  Kelli Hudson is a 31 y.o. female with medical history significant gastric ulcer due to H. pylori, GERD, type 1 diabetes, diabetic gastroparesis, history of DKA, hypertension, hypothyroidism, normocytic anemia, recent admission on 1015 until 12/05/2021 due to gastroparesis presented to hospital with recurrent abdominal pain multiple emesis up to 20 episodes with coffee-ground emesis.  In the ED patient was hypertensive.  Tachycardia was noted.  Lab showed hemoglobin of 12.3.  Bicarb of 20 and glucose was elevated at 313 with a creatinine of 1.2.  Lipase was within normal limits.  Urinalysis showed proteinuria and some bacteria.  Patient was then treated symptomatically and was admitted hospital for intractable nausea vomiting.    Assessment and plan.  Principal Problem:   Diabetic gastroparesis ([facility]) Active Problems:   Sinus tachycardia   Essential hypertension   GERD without esophagitis   Gastroparesis   HLD (hyperlipidemia)   Uncontrolled type 1 diabetes mellitus with hyperglycemia, with long-term current use of insulin (HCC)   Hypomagnesemia   Nausea and vomiting   Uncontrolled type 1 diabetes mellitus with hyperglycemia,    with long-term current use of insulin  Continue basal and sliding scale insulin Accu-Cheks.  Closely monitor.  Intractable nausea vomiting, abdominal pain coffee-ground emesis likely secondary to diabetic gastroparesis.  Recent admission for the same..  GI has been consulted.  On clears at this time.  Continue IV fluids.  Continue supportive care.    Patient does have an appointment with GI in [place] on 12/09/2021 for evaluation of gastric pacemaker placement.  Patient was trying to get Motegrity as outpatient but has been denied by insurance.  Patient states that sees been having a lot of abdominal pain and requesting  Dilaudid every 4 hours.  On scheduled Reglan and Compazine as per GI.  Discussed about limiting on Dilaudid.  Mild hyponatremia likely secondary to nausea vomiting.  We will continue to monitor.  Continue IV fluids.     Hypomagnesemia Magnesium of 1.5 and presentation.  Received supplementation.  We will continue to monitor.     Sinus tachycardia Likely secondary to volume depletion.  On as  metoprolol IV.     Essential hypertension On metoprolol IV.  We will also add hydralazine as needed.  Could change to Coreg when p.o. okay.     GERD without esophagitis Continue IV PPI.     HLD (hyperlipidemia) Resume statin once oral intake.      DVT prophylaxis: SCDs Start: 12/07/21 0836   Code Status:     Code Status: Full Code  Disposition: Home likely in 1 to 2 days  Status is: Inpatient  Remains inpatient appropriate because: Intractable nausea vomiting abdominal pain gastroparesis   Family Communication: None at bedside  Consultants:  GI  Procedures:  None yet  Antimicrobials:  None  Anti-infectives (From admission, onward)    None       Subjective: Today, patient was seen and examined at bedside.  Complains of abdominal pain on Dilaudid.  Had 4 episodes of vomiting this morning.  On clears.  Objective: Vitals:   12/07/21 2126 12/08/21 0055 12/08/21 0653 12/08/21 1403  BP: 122/84 (!) 142/82 (!) 158/114 (!) 179/110  Pulse: (!) 107 (!) 103 (!) 104 (!) 115  Resp: 16 14 18 18   Temp: 98.1 F (36.7 C) 97.7 F (36.5 C) 98.1 F (36.7 C) 98.7 F (37.1 C)  TempSrc: Oral Oral Oral Oral  SpO2: 98% 100% 99% 99%  Weight:      Height:        Intake/Output Summary (Last 24 hours) at 12/08/2021 1432 Last data filed at 12/07/2021 2300 Gross per 24 hour  Intake 1173.96 ml  Output --  Net 1173.96 ml   Filed Weights   12/07/21 0035  Weight: 87 kg    Physical Examination: Body mass index is 28.32 kg/m.   General:  Average built, not in obvious distress HENT:    No scleral pallor or icterus noted. Oral mucosa is moist.  Chest:  Clear breath sounds.  Diminished breath sounds bilaterally. No crackles or wheezes.  CVS: S1 &S2 heard. No murmur.  Regular rate and rhythm. Abdomen: Soft, nonspecific tenderness, nondistended.  Bowel sounds are heard.   Extremities: No cyanosis, clubbing or edema.  Peripheral pulses are palpable. Psych: Alert, awake and oriented, normal mood CNS:  No cranial nerve deficits.  Power equal in all extremities.   Skin: Warm and dry.  No rashes noted.  Data Reviewed:   CBC: Recent Labs  Lab 12/01/21 2307 12/02/21 0816 12/03/21 0545 12/07/21 0105 12/07/21 0851 12/07/21 1451 12/07/21 2040 12/08/21 0601  WBC 8.8 9.0 9.9 7.4  --   --   --  9.6  NEUTROABS  --  7.1  --   --   --   --   --   --   HGB 11.0* 10.0* 9.7* 12.3 11.3* 9.9* 10.0* 9.7*  HCT 33.4* 31.5* 31.4* 38.8 34.7* 30.2* 30.9* 29.8*  MCV 89.3 91.0 93.5 92.2  --   --   --  90.0  PLT 455* 372 397 380  --   --   --  768    Basic Metabolic Panel: Recent Labs  Lab 12/04/21 0827 12/04/21 2055 12/05/21 0517 12/07/21 0105 12/07/21 0851 12/08/21 0601  NA 139 135 135 138  --  134*  K 5.5*  5.4* 5.6* 4.8 4.5  --  3.8  CL 111 108 108 109  --  106  CO2 22 20* 23 20*  --  21*  GLUCOSE 183* 209* 214* 313*  --  236*  BUN 15 15 16 18   --  18  CREATININE 0.97 0.99 0.92 1.22*  --  0.95  CALCIUM 9.4 9.4 9.4 9.6  --  8.4*  MG  --   --   --   --  1.5*  --     Liver Function Tests: Recent Labs  Lab 12/01/21 2307 12/02/21 0816 12/03/21 0545 12/07/21 0105 12/08/21 0601  AST 21 20 24 18  14*  ALT 23 20 21 18 13   ALKPHOS 94 78 75 86 67  BILITOT 1.1 1.0 1.0 0.8 0.9  PROT 7.5 6.2* 6.4* 7.4 5.5*  ALBUMIN 3.5 2.8* 3.0* 3.5 2.6*     Radiology Studies: No results found.    LOS: 0 days    Flora Lipps, MD [facility] Available via Epic secure chat 7am-7pm After these hours, please refer to coverage provider listed on [email or web address] 12/08/2021, 2:32 PM

## 2021-12-08 NOTE — Hospital Course (Addendum)
Kelli Hudson is a 31 y.o. female with medical history significant gastric ulcer due to H. pylori, GERD, type 1 diabetes, diabetic gastroparesis, history of DKA, hypertension, hypothyroidism, normocytic anemia, recent admission on 1015 until 12/05/2021 due to gastroparesis presented to hospital with recurrent abdominal pain multiple emesis up to 20 episodes with coffee-ground emesis.  In the ED patient was hypertensive.  Tachycardia was noted.  Lab showed hemoglobin of 12.3.  Bicarb of 20 and glucose was elevated at 313 with a creatinine of 1.2.  Lipase was within normal limits.  Urinalysis showed proteinuria and some bacteria.  Patient was then treated symptomatically and was admitted hospital for intractable nausea vomiting.    Assessment and plan.  Uncontrolled type 1 diabetes mellitus with hyperglycemia,    with long-term current use of insulin  Continue basal and sliding scale insulin Accu-Cheks.  Closely monitor.  Intractable nausea vomiting, abdominal pain coffee-ground emesis likely secondary to diabetic gastroparesis.  Recent admission for the same..  GI has been consulted.  On clears at this time.  Continue IV fluids.  Continue supportive care.  On Compazine and scheduled Reglan.  Patient does have an appointment with GI in [place] on 12/09/2021 for evaluation of gastric pacemaker placement.  Patient was trying to get Motegrity has been denied by insurance.  Patient states that sees been having a lot of abdominal pain and requesting Dilaudid every 4 hours.  Mild hyponatremia likely secondary to nausea vomiting.  We will continue to monitor.  Continue IV fluids.     Hypomagnesemia Magnesium of 1.5 and presentation.  Received supplementation.  We will continue to monitor.     Sinus tachycardia Likely secondary to volume depletion.  On as needed metoprolol IV.     Essential hypertension On metoprolol IV.  We will also add hydralazine as needed.  Could change to Coreg when p.o.  okay.     GERD without esophagitis Continue IV PPI.     HLD (hyperlipidemia) Resume statin once oral intake.

## 2021-12-09 DIAGNOSIS — E1143 Type 2 diabetes mellitus with diabetic autonomic (poly)neuropathy: Secondary | ICD-10-CM | POA: Diagnosis not present

## 2021-12-09 DIAGNOSIS — K3184 Gastroparesis: Secondary | ICD-10-CM | POA: Diagnosis not present

## 2021-12-09 LAB — BASIC METABOLIC PANEL
Anion gap: 11 (ref 5–15)
BUN: 14 mg/dL (ref 6–20)
CO2: 21 mmol/L — ABNORMAL LOW (ref 22–32)
Calcium: 9.4 mg/dL (ref 8.9–10.3)
Chloride: 98 mmol/L (ref 98–111)
Creatinine, Ser: 0.93 mg/dL (ref 0.44–1.00)
GFR, Estimated: >60 mL/min (ref >60)
Glucose, Bld: 189 mg/dL — ABNORMAL HIGH (ref 70–99)
Potassium: 3.9 mmol/L (ref 3.5–5.1)
Sodium: 130 mmol/L — ABNORMAL LOW (ref 135–145)

## 2021-12-09 LAB — CBC
HCT: 39.5 % (ref 36.0–46.0)
Hemoglobin: 12.9 g/dL (ref 12.0–15.0)
MCH: 29 pg (ref 26.0–34.0)
MCHC: 32.7 g/dL (ref 30.0–36.0)
MCV: 88.8 fL (ref 80.0–100.0)
Platelets: 307 10*3/uL (ref 150–400)
RBC: 4.45 MIL/uL (ref 3.87–5.11)
RDW: 13.5 % (ref 11.5–15.5)
WBC: 9.4 10*3/uL (ref 4.0–10.5)
nRBC: 0 % (ref 0.0–0.2)

## 2021-12-09 LAB — MAGNESIUM: Magnesium: 1.6 mg/dL — ABNORMAL LOW (ref 1.7–2.4)

## 2021-12-09 LAB — GLUCOSE, CAPILLARY
Glucose-Capillary: 199 mg/dL — ABNORMAL HIGH (ref 70–99)
Glucose-Capillary: 206 mg/dL — ABNORMAL HIGH (ref 70–99)
Glucose-Capillary: 112 mg/dL — ABNORMAL HIGH (ref 70–99)
Glucose-Capillary: 183 mg/dL — ABNORMAL HIGH (ref 70–99)

## 2021-12-09 MED ORDER — HYDROMORPHONE HCL 1 MG/ML IJ SOLN
0.5000 mg | INTRAMUSCULAR | Status: AC | PRN
  Administered 2021-12-09 (×2): 0.5 mg via INTRAVENOUS
  Filled 2021-12-09 (×2): qty 0.5

## 2021-12-09 MED ORDER — CARVEDILOL 12.5 MG PO TABS
12.5000 mg | ORAL_TABLET | Freq: Two times a day (BID) | ORAL | Status: DC
  Administered 2021-12-09 – 2021-12-12 (×6): 12.5 mg via ORAL
  Filled 2021-12-09 (×6): qty 1

## 2021-12-09 MED ORDER — MAGNESIUM SULFATE 2 GM/50ML IV SOLN
2.0000 g | Freq: Once | INTRAVENOUS | Status: AC
  Administered 2021-12-09: 2 g via INTRAVENOUS
  Filled 2021-12-09: qty 50

## 2021-12-09 MED ORDER — HYDROMORPHONE HCL 1 MG/ML IJ SOLN
0.5000 mg | INTRAMUSCULAR | Status: AC | PRN
  Administered 2021-12-09 – 2021-12-11 (×12): 0.5 mg via INTRAVENOUS
  Filled 2021-12-09 (×13): qty 0.5

## 2021-12-09 NOTE — Progress Notes (Signed)
Patient ID: Kelli Hudson, female   DOB: 1990-12-11, 31 y.o.   MRN: [id number]    Progress Note   Subjective    Day # 3 CC; diabetic gastroparesis, recurrent nausea vomiting and abdominal pain  Patient says she is feeling better this morning, was able to keep clear liquids down without difficulty, no nausea or vomiting today She canceled her appointment with GI in [place] which was for initial evaluation for gastric pacemaker, this has been rescheduled for early November.. She says that she did not have any antiemetics, or Reglan at home available to her prior to this admission.  CBC today normal Mg 1.6 Na 130/potassium 3.9/glucose 189 114/creatinine 0.93   Objective   Vital signs in last 24 hours: Temp:  [97.9 F (36.6 C)-99 F (37.2 C)] 97.9 F (36.6 C) (10/23 1103) Pulse Rate:  [113-131] 113 (10/23 1103) Resp:  [10-19] 17 (10/23 1103) BP: (128-187)/(86-123) 138/92 (10/23 1103) SpO2:  [97 %-100 %] 99 % (10/23 1103) Last BM Date : 12/08/21 General:   African-American female in NAD Heart:  Regular rate and rhythm; no murmurs Lungs: Respirations even and unlabored, lungs CTA bilaterally Abdomen:  Soft, nondistended, no focal tenderness, normal bowel sounds. Extremities:  Without edema. Neurologic:  Alert and oriented,  grossly normal neurologically. Psych:  Cooperative. Normal mood and affect.  Intake/Output from previous day: 10/22 0701 - 10/23 0700 In: 3942.6 [I.V.:3942.6] Out: -  Intake/Output this shift: Total I/O In: 720 [P.O.:720] Out: -   Lab Results: Recent Labs    12/07/21 0105 12/07/21 0851 12/07/21 2040 12/08/21 0601 12/09/21 0532  WBC 7.4  --   --  9.6 9.4  HGB 12.3   < > 10.0* 9.7* 12.9  HCT 38.8   < > 30.9* 29.8* 39.5  PLT 380  --   --  371 307   < > = values in this interval not displayed.   BMET Recent Labs    12/07/21 0105 12/08/21 0601 12/09/21 0532  NA 138 134* 130*  K 4.5 3.8 3.9  CL 109 106 98  CO2 20* 21* 21*  GLUCOSE  313* 236* 189*  BUN 18 18 14   CREATININE 1.22* 0.95 0.93  CALCIUM 9.6 8.4* 9.4   LFT Recent Labs    12/08/21 0601  PROT 5.5*  ALBUMIN 2.6*  AST 14*  ALT 13  ALKPHOS 67  BILITOT 0.9   PT/INR No results for input(s): "LABPROT", "INR" in the last 72 hours.     Assessment / Plan:    #[age] year-old African-American female with history of insulin-dependent diabetes mellitus, complicated by diabetic gastroparesis multiple hospital admissions over the past year with recurrent intractable nausea /vomiting. Readmitted with same, had just been discharged on 12/05/2021  Improved over the past 24 hours with combination of scheduled IV Reglan, IV Zofran and as needed Compazine Recent EGD 08/2021 normal, per notes had empiric Botox of the pylorus which was ineffective Had been scheduled for appointment with GI in [place] regarding gastric pacemaker-patient canceled the appointment for today and rescheduled for 12/23/2021  History of prolonged QT, EKG on this admission with normal QTc of 400  #2 GERD-stable continue IV PPI today, then convert to oral #3 acute kidney injury improved  Plan; if keeps down clear liquids today, can advance to full liquid in a.m. Continue IV PPI today, convert to p.o. tomorrow if able to keep down p.o.'s Continue around-the-clock metoclopramide and scheduled Zofran  Does not need repeat EGD at this time Hopefully she can get  to her next appointment in [place] for consideration of gastric pacemaker       Principal Problem:   Diabetic gastroparesis (HCC) Active Problems:   Gastroparesis   Sinus tachycardia   HLD (hyperlipidemia)   Uncontrolled type 1 diabetes mellitus with hyperglycemia, with long-term current use of insulin (HCC)   Hypomagnesemia   Nausea and vomiting   Essential hypertension   GERD without esophagitis     LOS: 1 day   Kenichi Cassada EsterwoodPA-C  12/09/2021, 11:23 AM

## 2021-12-09 NOTE — Progress Notes (Signed)
PROGRESS NOTE    [name]  PNT:[id number] DOB: 11-21-1990 DOA: 12/07/2021 PCP: Loyola Mast, MD    Brief Narrative:  Kelli Hudson is a 31 y.o. female with medical history significant gastric ulcer due to H. pylori, GERD, type 1 diabetes, diabetic gastroparesis, history of DKA, hypertension, hypothyroidism, normocytic anemia, recent admission on 1015 until 12/05/2021 due to gastroparesis presented to hospital with recurrent abdominal pain multiple emesis up to 20 episodes with coffee-ground emesis.  In the ED patient was hypertensive.  Tachycardia was noted.  Lab showed hemoglobin of 12.3.  Bicarb of 20 and glucose was elevated at 313 with a creatinine of 1.2.  Lipase was within normal limits.  Urinalysis showed proteinuria and some bacteria.  Patient was then treated symptomatically and was admitted hospital for intractable nausea vomiting.    Assessment and plan.  Principal Problem:   Diabetic gastroparesis (HCC) Active Problems:   Sinus tachycardia   Essential hypertension   GERD without esophagitis   Gastroparesis   HLD (hyperlipidemia)   Uncontrolled type 1 diabetes mellitus with hyperglycemia, with long-term current use of insulin (HCC)   Hypomagnesemia   Nausea and vomiting   Uncontrolled type 1 diabetes mellitus with hyperglycemia,    with long-term current use of insulin  Continue basal and sliding scale insulin Accu-Cheks.  Blood sugars acceptable today.  Intractable nausea vomiting, abdominal pain coffee-ground emesis likely secondary to diabetic gastroparesis.  Recent admission for the same..   GI following.  Currently tolerating clears.  Continue IV fluids.   states that sees been having a lot of abdominal pain and requesting Dilaudid every 4 hours.  On scheduled Reglan and Compazine as per GI. Hopefully she can go on full liquid by tomorrow.  Mild hyponatremia likely secondary to nausea vomiting.  We will continue to monitor.  Continue IV  fluids.   Hypomagnesemia Magnesium of 1.6 today.  Replace further.     Sinus tachycardia Likely secondary to volume depletion.  On as  metoprolol IV. Resume carvedilol.   Essential hypertension On metoprolol IV as needed.  She takes Coreg 12.5 mg twice daily at home.  Will resume today.     GERD without esophagitis Continue IV PPI.     HLD (hyperlipidemia) Resume statin once oral intake.      DVT prophylaxis: SCDs Start: 12/07/21 0836   Code Status:     Code Status: Full Code  Disposition: Home likely in 1 to 2 days after tolerating diet.  Status is: Inpatient  Remains inpatient appropriate because: Intractable nausea vomiting abdominal pain gastroparesis   Family Communication: None at bedside  Consultants:  GI  Procedures:  None yet  Antimicrobials:  None  Anti-infectives (From admission, onward)    None       Subjective: Patient seen and examined.  She looks very comfortable.  Patient tells me that she was able to keep down liquids without any problems.  Objective: Vitals:   12/09/21 1006 12/09/21 1100 12/09/21 1103 12/09/21 1200  BP:   (!) 138/92   Pulse:   (!) 113   Resp: 14 10 17 18   Temp:   97.9 F (36.6 C)   TempSrc:   Oral   SpO2:   99%   Weight:      Height:        Intake/Output Summary (Last 24 hours) at 12/09/2021 1238 Last data filed at 12/09/2021 0900 Gross per 24 hour  Intake 4662.62 ml  Output --  Net 4662.62 ml    12/11/2021  Weights   12/07/21 0035  Weight: 87 kg    Physical Examination: Body mass index is 28.32 kg/m.   General: Looks fairly comfortable.  Sleepy. Cardiovascular: S1-S2 normal.  Regular rate rhythm. Respiratory: Bilateral clear.  No added sounds. Gastrointestinal: Soft.  Not distended.  Diffuse generalized pain all over on palpation. Ext: No edema or swelling.  No cyanosis.  No deformities. Neuro: Alert oriented x4.  No neurological deficits.   Data Reviewed:   CBC: Recent Labs  Lab  12/03/21 0545 12/07/21 0105 12/07/21 0851 12/07/21 1451 12/07/21 2040 12/08/21 0601 12/09/21 0532  WBC 9.9 7.4  --   --   --  9.6 9.4  HGB 9.7* 12.3 11.3* 9.9* 10.0* 9.7* 12.9  HCT 31.4* 38.8 34.7* 30.2* 30.9* 29.8* 39.5  MCV 93.5 92.2  --   --   --  90.0 88.8  PLT 397 380  --   --   --  371 307     Basic Metabolic Panel: Recent Labs  Lab 12/04/21 2055 12/05/21 0517 12/07/21 0105 12/07/21 0851 12/08/21 0601 12/09/21 0532  NA 135 135 138  --  134* 130*  K 5.6* 4.8 4.5  --  3.8 3.9  CL 108 108 109  --  106 98  CO2 20* 23 20*  --  21* 21*  GLUCOSE 209* 214* 313*  --  236* 189*  BUN 15 16 18   --  18 14  CREATININE 0.99 0.92 1.22*  --  0.95 0.93  CALCIUM 9.4 9.4 9.6  --  8.4* 9.4  MG  --   --   --  1.5*  --  1.6*     Liver Function Tests: Recent Labs  Lab 12/03/21 0545 12/07/21 0105 12/08/21 0601  AST 24 18 14*  ALT 21 18 13   ALKPHOS 75 86 67  BILITOT 1.0 0.8 0.9  PROT 6.4* 7.4 5.5*  ALBUMIN 3.0* 3.5 2.6*      Radiology Studies: No results found.    LOS: 1 day    Barb Merino, MD [facility] Available via Epic secure chat 7am-7pm After these hours, please refer to coverage provider listed on [email or web address] 12/09/2021, 12:38 PM

## 2021-12-10 ENCOUNTER — Encounter (HOSPITAL_COMMUNITY): Payer: Self-pay

## 2021-12-10 DIAGNOSIS — K3184 Gastroparesis: Secondary | ICD-10-CM | POA: Diagnosis not present

## 2021-12-10 DIAGNOSIS — E1143 Type 2 diabetes mellitus with diabetic autonomic (poly)neuropathy: Secondary | ICD-10-CM | POA: Diagnosis not present

## 2021-12-10 LAB — GLUCOSE, CAPILLARY
Glucose-Capillary: 268 mg/dL — ABNORMAL HIGH (ref 70–99)
Glucose-Capillary: 178 mg/dL — ABNORMAL HIGH (ref 70–99)
Glucose-Capillary: 68 mg/dL — ABNORMAL LOW (ref 70–99)
Glucose-Capillary: 104 mg/dL — ABNORMAL HIGH (ref 70–99)

## 2021-12-10 NOTE — Inpatient Diabetes Management (Signed)
Inpatient Diabetes Program Recommendations  AACE/ADA: New Consensus Statement on Inpatient Glycemic Control (2015)  Target Ranges:  Prepandial:   less than 140 mg/dL      Peak postprandial:   less than 180 mg/dL (1-2 hours)      Critically ill patients:  140 - 180 mg/dL   Lab Results  Component Value Date   GLUCAP 178 (H) 12/10/2021   HGBA1C 10.3 (H) 10/06/2021    Review of Glycemic Control  Latest Reference Range & Units 12/09/21 11:05 12/09/21 16:36 12/09/21 20:20 12/10/21 08:48 12/10/21 11:43  Glucose-Capillary 70 - 99 mg/dL 206 (H) 112 (H) 183 (H) 268 (H) 178 (H)  (H): Data is abnormally high Diabetes history: Type 1 DM Outpatient Diabetes medications: Levemir 16 units QD, novolin R 0-15 units TID Current orders for Inpatient glycemic control: Novolog 0-9 units TID, Levemir 16 units QD   Inpatient Diabetes Program Recommendations:     Patient remains on clear liquids and continues to be unable to advance diet.   Consider switching correction to Novolog 0-9 units Q4H.   Thanks, Bronson Curb, MSN, RNC-OB Diabetes Coordinator [phone] (8a-5p)

## 2021-12-10 NOTE — Telephone Encounter (Signed)
Transition Care Management Unsuccessful Follow-up Telephone Call  Date of discharge and from where:  12/05/21 [facility] In[facility]. Dx: Generalized abdominal pain  Attempts:  2nd Attempt  Reason for unsuccessful TCM follow-up call:  Left voice message

## 2021-12-10 NOTE — Progress Notes (Signed)
Patient ID: Kelli Hudson, female   DOB: Jan 05, 1991, 31 y.o.   MRN: [id number]    Progress Note   Subjective   Day # 4  CC; abdominal pain, nausea vomiting, severe diabetic gastroparesis  Patient up walking the halls, says it helps with her abdominal pain, waiting for pain medication, did okay with clear liquids yesterday for the most part but vomited all of her breakfast this morning (still clear liquids)  Last hemoglobin A1c 10/06/2021 -10.3 No new labs today    Objective   Vital signs in last 24 hours: Temp:  [97.9 F (36.6 C)-98.8 F (37.1 C)] 98.2 F (36.8 C) (10/24 0542) Pulse Rate:  [105-113] 109 (10/24 0652) Resp:  [10-19] 14 (10/24 0652) BP: (104-162)/(67-107) 104/67 (10/24 0652) SpO2:  [99 %] 99 % (10/24 0652) Last BM Date : 12/08/21 General: Young African-American female in NAD, comfortable appearing Up walking Neurologic:  Alert and oriented,  grossly normal neurologically. Psych:  Cooperative. Normal mood and affect.  Intake/Output from previous day: 10/23 0701 - 10/24 0700 In: 1721.1 [P.O.:720; I.V.:1001.1] Out: -  Intake/Output this shift: No intake/output data recorded.  Lab Results: Recent Labs    12/07/21 2040 12/08/21 0601 12/09/21 0532  WBC  --  9.6 9.4  HGB 10.0* 9.7* 12.9  HCT 30.9* 29.8* 39.5  PLT  --  371 307   BMET Recent Labs    12/08/21 0601 12/09/21 0532  NA 134* 130*  K 3.8 3.9  CL 106 98  CO2 21* 21*  GLUCOSE 236* 189*  BUN 18 14  CREATININE 0.95 0.93  CALCIUM 8.4* 9.4   LFT Recent Labs    12/08/21 0601  PROT 5.5*  ALBUMIN 2.6*  AST 14*  ALT 13  ALKPHOS 67  BILITOT 0.9   PT/INR No results for input(s): "LABPROT", "INR" in the last 72 hours.       Assessment / Plan:    #82 31 year old African-American female with insulin-dependent diabetes mellitus complicated by severe diabetic gastroparesis, recurrent admissions with nausea ,vomiting ,abdominal pain Readmitted 3 days ago, and had just been  discharged from the hospital 12/05/2021.  She is stable but really has not made any progress over the past 24 hours, does not feel that she can advance her diet as yet, vomited breakfast this morning, still having ongoing pain which she says is always worse with these exacerbations and improves in between exacerbations.  #2 poorly controlled insulin-dependent diabetes mellitus #3 hypertension   Plan; continue clear liquids today  will change Zofran to around-the-clock rather than as needed Continue metoclopramide 10 mg IV 4 times daily Judicious use of narcotics as worsens gastroparesis Goal needs to be towards tighter control of diabetes  Patient has been referred to gastroenterologist in [place] for consideration of gastric pacemaker.  She missed that appointment yesterday while hospitalized and appointment rescheduled for 12/23/2021      Principal Problem:   Diabetic gastroparesis ([place]) Active Problems:   Gastroparesis   Sinus tachycardia   HLD (hyperlipidemia)   Uncontrolled type 1 diabetes mellitus with hyperglycemia, with long-term current use of insulin (HCC)   Hypomagnesemia   Nausea and vomiting   Essential hypertension   GERD without esophagitis     LOS: 2 days   Schyler Counsell PA-C 12/10/2021, 10:03 AM

## 2021-12-10 NOTE — Plan of Care (Signed)
  Problem: Education: Goal: Ability to describe self-care measures that may prevent or decrease complications (Diabetes Survival Skills Education) will improve Outcome: Progressing Goal: Individualized Educational Video(s) Outcome: Progressing   Problem: Health Behavior/Discharge Planning: Goal: Ability to identify and utilize available resources and services will improve Outcome: Progressing Goal: Ability to manage health-related needs will improve Outcome: Progressing   

## 2021-12-10 NOTE — Progress Notes (Signed)
No EKG required this morning per Dr. Sloan Leiter

## 2021-12-10 NOTE — Progress Notes (Signed)
PROGRESS NOTE    Kelli Hudson  WUJ:[id number] DOB: [birth year] DOA: 12/07/2021 PCP: [name], MD    Brief Narrative:  Kelli Hudson is a 31 y.o. female with medical history significant gastric ulcer due to H. pylori, GERD, type 1 diabetes, diabetic gastroparesis, history of DKA, hypertension, hypothyroidism, normocytic anemia, recent admission on 1015 until 12/05/2021 due to gastroparesis presented to hospital with recurrent abdominal pain multiple emesis up to 20 episodes with coffee-ground emesis.  In the ED patient was hypertensive.  Tachycardia was noted.  Lab showed hemoglobin of 12.3.  Bicarb of 20 and glucose was elevated at 313 with a creatinine of 1.2.  Lipase was within normal limits.  Urinalysis showed proteinuria and some bacteria.  Patient was then treated symptomatically and was admitted hospital for intractable nausea vomiting.    Assessment and plan.  Principal Problem:   Diabetic gastroparesis ([place]) Active Problems:   Sinus tachycardia   Essential hypertension   GERD without esophagitis   Gastroparesis   HLD (hyperlipidemia)   Uncontrolled type 1 diabetes mellitus with hyperglycemia, with long-term current use of insulin (HCC)   Hypomagnesemia   Nausea and vomiting   Uncontrolled type 1 diabetes mellitus with hyperglycemia,    with long-term current use of insulin  Continue basal and sliding scale insulin Accu-Cheks.  Blood sugars acceptable today. As patient is not able to advance diet.  We will keep on current insulin doses today.  Intractable nausea vomiting, abdominal pain coffee-ground emesis likely secondary to diabetic gastroparesis.  Recent admission for the same..   GI following.  Difficult tolerating clears.  Continue IV fluids.   states that sees been having a lot of abdominal pain and requesting Dilaudid every 4 hours.  On a scheduled Reglan and Compazine.  Started on scheduled Zofran today.  Challenging to manage.  Mild  hyponatremia likely secondary to nausea vomiting.  We will continue to monitor.  Continue IV fluids.   Hypomagnesemia Replaced.  We will recheck tomorrow.   Sinus tachycardia Likely secondary to volume depletion.  Rate controlled on Coreg.   Essential hypertension On metoprolol IV as needed.  She takes Coreg 12.5 mg twice daily.     GERD without esophagitis Continue IV PPI.     HLD (hyperlipidemia) Resume statin once oral intake.      DVT prophylaxis: SCDs Start: 12/07/21 0836   Code Status:     Code Status: Full Code  Disposition: Home likely in 1 to 2 days after tolerating diet.  Status is: Inpatient  Remains inpatient appropriate because: Intractable nausea vomiting abdominal pain gastroparesis   Family Communication: None at bedside  Consultants:  GI  Procedures:  None yet  Antimicrobials:  None  Anti-infectives (From admission, onward)    None       Subjective:  Patient seen walking around in the hallway.  Continues to complain of epigastric pain and she was wondering whether she can have larger dose of Dilaudid.  We discussed about opiates exacerbating gastroparesis symptoms and keeping on current doses.  She is taking clears and still sometimes vomiting.  Objective: Vitals:   12/09/21 1600 12/09/21 2018 12/10/21 0542 12/10/21 0652  BP:  114/72 (!) 162/107 104/67  Pulse:  (!) 109 (!) 110 (!) 109  Resp: 17 17 16 14   Temp:  98.8 F (37.1 C) 98.2 F (36.8 C)   TempSrc:   Oral   SpO2:  99% 99% 99%  Weight:      Height:  Intake/Output Summary (Last 24 hours) at 12/10/2021 1256 Last data filed at 12/09/2021 1502 Gross per 24 hour  Intake 1001.12 ml  Output --  Net 1001.12 ml    Filed Weights   12/07/21 0035  Weight: 87 kg    Physical Examination: Body mass index is 28.32 kg/m.   General: Walking in the hallway with some discomfort. Cardiovascular: S1-S2 normal.  Regular rate rhythm. Respiratory: Bilateral clear.  No added  sounds. Gastrointestinal: Soft.  Not distended.  Diffuse generalized pain all over on palpation. Ext: No edema or swelling.  No cyanosis.  No deformities. Neuro: Alert oriented x4.  No neurological deficits.   Data Reviewed:   CBC: Recent Labs  Lab 12/07/21 0105 12/07/21 0851 12/07/21 1451 12/07/21 2040 12/08/21 0601 12/09/21 0532  WBC 7.4  --   --   --  9.6 9.4  HGB 12.3 11.3* 9.9* 10.0* 9.7* 12.9  HCT 38.8 34.7* 30.2* 30.9* 29.8* 39.5  MCV 92.2  --   --   --  90.0 88.8  PLT 380  --   --   --  371 307     Basic Metabolic Panel: Recent Labs  Lab 12/04/21 2055 12/05/21 0517 12/07/21 0105 12/07/21 0851 12/08/21 0601 12/09/21 0532  NA 135 135 138  --  134* 130*  K 5.6* 4.8 4.5  --  3.8 3.9  CL 108 108 109  --  106 98  CO2 20* 23 20*  --  21* 21*  GLUCOSE 209* 214* 313*  --  236* 189*  BUN 15 16 18   --  18 14  CREATININE 0.99 0.92 1.22*  --  0.95 0.93  CALCIUM 9.4 9.4 9.6  --  8.4* 9.4  MG  --   --   --  1.5*  --  1.6*     Liver Function Tests: Recent Labs  Lab 12/07/21 0105 12/08/21 0601  AST 18 14*  ALT 18 13  ALKPHOS 86 67  BILITOT 0.8 0.9  PROT 7.4 5.5*  ALBUMIN 3.5 2.6*      Radiology Studies: No results found.    LOS: 2 days    Barb Merino, MD [facility] Available via Epic secure chat 7am-7pm After these hours, please refer to coverage provider listed on [email or web address] 12/10/2021, 12:56 PM

## 2021-12-11 DIAGNOSIS — E1143 Type 2 diabetes mellitus with diabetic autonomic (poly)neuropathy: Secondary | ICD-10-CM | POA: Diagnosis not present

## 2021-12-11 DIAGNOSIS — K3184 Gastroparesis: Secondary | ICD-10-CM | POA: Diagnosis not present

## 2021-12-11 LAB — CBC WITH DIFFERENTIAL/PLATELET
Abs Immature Granulocytes: 0.03 10*3/uL (ref 0.00–0.07)
Basophils Absolute: 0.1 10*3/uL (ref 0.0–0.1)
Basophils Relative: 1 %
Eosinophils Absolute: 0.1 10*3/uL (ref 0.0–0.5)
Eosinophils Relative: 1 %
HCT: 30 % — ABNORMAL LOW (ref 36.0–46.0)
Hemoglobin: 9.6 g/dL — ABNORMAL LOW (ref 12.0–15.0)
Immature Granulocytes: 0 %
Lymphocytes Relative: 55 %
Lymphs Abs: 4.7 10*3/uL — ABNORMAL HIGH (ref 0.7–4.0)
MCH: 28.9 pg (ref 26.0–34.0)
MCHC: 32 g/dL (ref 30.0–36.0)
MCV: 90.4 fL (ref 80.0–100.0)
Monocytes Absolute: 0.8 10*3/uL (ref 0.1–1.0)
Monocytes Relative: 9 %
Neutro Abs: 2.9 10*3/uL (ref 1.7–7.7)
Neutrophils Relative %: 34 %
Platelets: 370 10*3/uL (ref 150–400)
RBC: 3.32 MIL/uL — ABNORMAL LOW (ref 3.87–5.11)
RDW: 13.6 % (ref 11.5–15.5)
WBC: 8.6 10*3/uL (ref 4.0–10.5)
nRBC: 0 % (ref 0.0–0.2)

## 2021-12-11 LAB — BASIC METABOLIC PANEL
Anion gap: 8 (ref 5–15)
BUN: 13 mg/dL (ref 6–20)
CO2: 21 mmol/L — ABNORMAL LOW (ref 22–32)
Calcium: 8.7 mg/dL — ABNORMAL LOW (ref 8.9–10.3)
Chloride: 104 mmol/L (ref 98–111)
Creatinine, Ser: 1.11 mg/dL — ABNORMAL HIGH (ref 0.44–1.00)
GFR, Estimated: >60 mL/min (ref >60)
Glucose, Bld: 114 mg/dL — ABNORMAL HIGH (ref 70–99)
Potassium: 4.5 mmol/L (ref 3.5–5.1)
Sodium: 133 mmol/L — ABNORMAL LOW (ref 135–145)

## 2021-12-11 LAB — GLUCOSE, CAPILLARY
Glucose-Capillary: 115 mg/dL — ABNORMAL HIGH (ref 70–99)
Glucose-Capillary: 175 mg/dL — ABNORMAL HIGH (ref 70–99)
Glucose-Capillary: 148 mg/dL — ABNORMAL HIGH (ref 70–99)
Glucose-Capillary: 133 mg/dL — ABNORMAL HIGH (ref 70–99)

## 2021-12-11 LAB — MAGNESIUM: Magnesium: 1.6 mg/dL — ABNORMAL LOW (ref 1.7–2.4)

## 2021-12-11 LAB — PHOSPHORUS: Phosphorus: 3.8 mg/dL (ref 2.5–4.6)

## 2021-12-11 MED ORDER — HYDROMORPHONE HCL 1 MG/ML IJ SOLN
0.5000 mg | Freq: Four times a day (QID) | INTRAMUSCULAR | Status: AC | PRN
  Administered 2021-12-11 – 2021-12-12 (×4): 0.5 mg via INTRAVENOUS
  Filled 2021-12-11 (×3): qty 0.5

## 2021-12-11 MED ORDER — MAGNESIUM SULFATE 2 GM/50ML IV SOLN
2.0000 g | Freq: Once | INTRAVENOUS | Status: AC
  Administered 2021-12-11: 2 g via INTRAVENOUS
  Filled 2021-12-11: qty 50

## 2021-12-11 MED ORDER — PANTOPRAZOLE SODIUM 40 MG PO TBEC
40.0000 mg | DELAYED_RELEASE_TABLET | Freq: Every day | ORAL | Status: DC
  Administered 2021-12-12: 40 mg via ORAL
  Filled 2021-12-11: qty 1

## 2021-12-11 NOTE — Progress Notes (Signed)
Patient requesting pain medicine. Patient educated that the IV Dilaudid have been discontinued.

## 2021-12-11 NOTE — Plan of Care (Signed)

## 2021-12-11 NOTE — Progress Notes (Signed)
Patient ID: Kelli Hudson, female   DOB: 02-26-1990, 31 y.o.   MRN: [id number]    Progress Note   Subjective   Day #5 Cc; severe diabetic gastroparesis, recurrent nausea vomiting abdominal pain  She is feeling much better today, was able to keep down quite a bit of clear liquids at breakfast time, says her pain has improved and she is not nauseated at present, smiling  WBC 8.6/hemoglobin 9.6/hematocrit 30.0 Glucose 114/creatinine 1.11   Objective   Vital signs in last 24 hours: Temp:  [98 F (36.7 C)-99.7 F (37.6 C)] 99 F (37.2 C) (10/25 0508) Pulse Rate:  [100-110] 110 (10/25 0508) Resp:  [15-19] 15 (10/25 0508) BP: (117-148)/(76-98) 148/98 (10/25 0508) SpO2:  [97 %-100 %] 97 % (10/25 0508) Last BM Date : 12/01/21 General:   Young African-American female in NAD, looks better Heart:  Regular rate and rhythm; no murmurs Lungs: Respirations even and unlabored, lungs CTA bilaterally Abdomen:  Soft, nontender and nondistended. Normal bowel sounds. Extremities:  Without edema. Neurologic:  Alert and oriented,  grossly normal neurologically. Psych:  Cooperative. Normal mood and affect.  Intake/Output from previous day: 10/24 0701 - 10/25 0700 In: 5621.4 [P.O.:1080; I.V.:4541.4] Out: -  Intake/Output this shift: No intake/output data recorded.  Lab Results: Recent Labs    12/09/21 0532 12/11/21 0457  WBC 9.4 8.6  HGB 12.9 9.6*  HCT 39.5 30.0*  PLT 307 370   BMET Recent Labs    12/09/21 0532 12/11/21 0457  NA 130* 133*  K 3.9 4.5  CL 98 104  CO2 21* 21*  GLUCOSE 189* 114*  BUN 14 13  CREATININE 0.93 1.11*  CALCIUM 9.4 8.7*   LFT No results for input(s): "PROT", "ALBUMIN", "AST", "ALT", "ALKPHOS", "BILITOT", "BILIDIR", "IBILI" in the last 72 hours. PT/INR No results for input(s): "LABPROT", "INR" in the last 72 hours.       Assessment / Plan:    #20 31 year old African-American female with poorly controlled insulin-dependent diabetes mellitus,  with severe diabetic gastroparesis and multiple admissions over the past year with abdominal pain nausea and vomiting  She seems to have improved quite a bit over the past 24 hours Would like to try advancing diet IV pain meds discontinued yesterday  #2 anemia normocytic #3 hypertension  Plan; advance to full liquid diet, add mashed potatoes, soft fruit then advance as tolerated to carb modified Currently Zofran is 4 mg IV every 6 hours as needed, will leave as needed due to issues with QT Continue metoclopramide 10 mg IV 4 times daily We will DC IV Protonix and change to oral once daily  She does have an endocrinologist but has not seen her for several months, Dr. Kelton Pillar.  Advised patient to get an appointment as better management of her diabetes is imperative for improvement of her gastroparesis.  She will keep the appointment with gastroenterology in [place] 12/23/2021 for consideration of gastric pacemaker  Says that no prescriptions were sent from the hospital when she was discharged several weeks ago, we need to make sure that she has oral Zofran, and oral metoclopramide prescribed on discharge.      Principal Problem:   Diabetic gastroparesis ([place]) Active Problems:   Gastroparesis   Sinus tachycardia   HLD (hyperlipidemia)   Uncontrolled type 1 diabetes mellitus with hyperglycemia, with long-term current use of insulin (HCC)   Hypomagnesemia   Nausea and vomiting   Essential hypertension   GERD without esophagitis     LOS: 3 days  Audria Takeshita PA-C 12/11/2021, 9:30 AM

## 2021-12-11 NOTE — Progress Notes (Signed)
PROGRESS NOTE    Kelli Hudson  VCB:[id number] DOB: 03/14/1990 DOA: 12/07/2021 PCP: [name], MD    Brief Narrative:  Kelli Hudson is a 31 y.o. female with medical history significant gastric ulcer due to H. pylori, GERD, type 1 diabetes, diabetic gastroparesis, history of DKA, hypertension, hypothyroidism, normocytic anemia, recent admission on 1015 until 12/05/2021 due to gastroparesis presented to hospital with recurrent abdominal pain multiple emesis up to 20 episodes with coffee-ground emesis.  In the ED patient was hypertensive.  Tachycardia was noted.  Lab showed hemoglobin of 12.3.  Bicarb of 20 and glucose was elevated at 313 with a creatinine of 1.2.  Lipase was within normal limits.  Urinalysis showed proteinuria and some bacteria.  Patient was then treated symptomatically and was admitted hospital for intractable nausea vomiting.    Assessment and plan.  Principal Problem:   Diabetic gastroparesis ([place]) Active Problems:   Sinus tachycardia   Essential hypertension   GERD without esophagitis   Gastroparesis   HLD (hyperlipidemia)   Uncontrolled type 1 diabetes mellitus with hyperglycemia, with long-term current use of insulin (HCC)   Hypomagnesemia   Nausea and vomiting   Uncontrolled type 1 diabetes mellitus with hyperglycemia,    with long-term current use of insulin  Continue basal and sliding scale insulin Accu-Cheks.  Blood sugars acceptable today. As patient is not able to advance diet.  We will keep on current insulin doses today.  Intractable nausea vomiting, abdominal pain coffee-ground emesis likely secondary to diabetic gastroparesis.  Recent admission for the same..   GI following.  Today tolerating clears.  Advancing to full liquid diet.   Continue IV fluids.  Decrease frequency of pain medications to every 6 hours.  On a scheduled Reglan and as needed Zofran and Compazine. Some clinical response today.  Mild hyponatremia likely  secondary to nausea vomiting.  We will continue to monitor.  Continue IV fluids.   Hypomagnesemia Further replace with 2 g magnesium today.  Will restart oral magnesium when she is able to tolerate regular diet.   Sinus tachycardia Likely secondary to volume depletion.  Rate controlled on Coreg.   Essential hypertension Stable on Coreg.     GERD without esophagitis Continue IV PPI.     HLD (hyperlipidemia) Resume statin once oral intake.      DVT prophylaxis: SCDs Start: 12/07/21 0836   Code Status:     Code Status: Full Code  Disposition: Home likely tomorrow if tolerates diet.  Status is: Inpatient  Remains inpatient appropriate because: Intractable nausea vomiting abdominal pain gastroparesis   Family Communication: None at bedside  Consultants:  GI  Procedures:  None yet  Antimicrobials:  None  Anti-infectives (From admission, onward)    None       Subjective: Patient seen and examined.  She tells me she feels better today and trying some full liquid diet.  Requested Dilaudid but agreed to cut down on the frequency.  Objective: Vitals:   12/10/21 0652 12/10/21 1434 12/10/21 2022 12/11/21 0508  BP: 104/67 126/84 117/76 (!) 148/98  Pulse: (!) 109 (!) 110 100 (!) 110  Resp: 14 15 19 15   Temp:  98 F (36.7 C) 99.7 F (37.6 C) 99 F (37.2 C)  TempSrc:  Oral  Oral  SpO2: 99% 98% 100% 97%  Weight:      Height:        Intake/Output Summary (Last 24 hours) at 12/11/2021 1327 Last data filed at 12/11/2021 0400 Gross per 24 hour  Intake 5021.41 ml  Output --  Net 5021.41 ml   Filed Weights   12/07/21 0035  Weight: 87 kg    Physical Examination: Body mass index is 28.32 kg/m.   General: Looks fairly comfortable on interview.  Complains of ongoing abdominal pain. Cardiovascular: S1-S2 normal.  Regular rate rhythm. Respiratory: Bilateral clear.  No added sounds. Gastrointestinal: Soft.  Not distended.  Diffuse generalized pain all over on  palpation.  Voluntary guarding. Ext: No edema or swelling.  No cyanosis.  No deformities. Neuro: Alert oriented x4.  No neurological deficits.   Data Reviewed:   CBC: Recent Labs  Lab 12/07/21 0105 12/07/21 0851 12/07/21 1451 12/07/21 2040 12/08/21 0601 12/09/21 0532 12/11/21 0457  WBC 7.4  --   --   --  9.6 9.4 8.6  NEUTROABS  --   --   --   --   --   --  2.9  HGB 12.3   < > 9.9* 10.0* 9.7* 12.9 9.6*  HCT 38.8   < > 30.2* 30.9* 29.8* 39.5 30.0*  MCV 92.2  --   --   --  90.0 88.8 90.4  PLT 380  --   --   --  371 307 370   < > = values in this interval not displayed.    Basic Metabolic Panel: Recent Labs  Lab 12/05/21 0517 12/07/21 0105 12/07/21 0851 12/08/21 0601 12/09/21 0532 12/11/21 0457  NA 135 138  --  134* 130* 133*  K 4.8 4.5  --  3.8 3.9 4.5  CL 108 109  --  106 98 104  CO2 23 20*  --  21* 21* 21*  GLUCOSE 214* 313*  --  236* 189* 114*  BUN 16 18  --  18 14 13   CREATININE 0.92 1.22*  --  0.95 0.93 1.11*  CALCIUM 9.4 9.6  --  8.4* 9.4 8.7*  MG  --   --  1.5*  --  1.6* 1.6*  PHOS  --   --   --   --   --  3.8    Liver Function Tests: Recent Labs  Lab 12/07/21 0105 12/08/21 0601  AST 18 14*  ALT 18 13  ALKPHOS 86 67  BILITOT 0.8 0.9  PROT 7.4 5.5*  ALBUMIN 3.5 2.6*     Radiology Studies: No results found.    LOS: 3 days    12/10/21, MD [facility] Available via Epic secure chat 7am-7pm After these hours, please refer to coverage provider listed on [email or web address] 12/11/2021, 1:27 PM

## 2021-12-12 DIAGNOSIS — E1143 Type 2 diabetes mellitus with diabetic autonomic (poly)neuropathy: Secondary | ICD-10-CM | POA: Diagnosis not present

## 2021-12-12 DIAGNOSIS — K3184 Gastroparesis: Secondary | ICD-10-CM | POA: Diagnosis not present

## 2021-12-12 LAB — GLUCOSE, CAPILLARY
Glucose-Capillary: 112 mg/dL — ABNORMAL HIGH (ref 70–99)
Glucose-Capillary: 190 mg/dL — ABNORMAL HIGH (ref 70–99)

## 2021-12-12 NOTE — Discharge Summary (Signed)
Physician Discharge Summary  Kelli Hudson KCL:[id number] DOB: Jun 24, 1990 DOA: 12/07/2021  PCP: Loyola Mast, MD  Admit date: 12/07/2021 Discharge date: 12/12/2021  Admitted From: Home Disposition: Home  Recommendations for Outpatient Follow-up:  Follow up with GI as scheduled    Discharge Condition: Stable CODE STATUS: Full code Diet recommendation: Gastroparesis diet, small frequent meals  Discharge summary: 31 year old with history of gastric ulcer due to H. pylori, GERD, type 1 diabetes and gastroparesis, history of recurrent DKA admitted with another episode of gastroparesis attack and unable to eat.  Remained in the hospital due to significant symptoms and intolerance to oral diet.  Initially treated with n.p.o., IV fluids, multiple nausea medications and gradually advanced diet.  Currently tolerating well.  Today she is asymptomatic.  Pain is controlled.  Able to tolerate full liquid diet and eager to go home. She was seen and followed by gastroenterology in the hospital.  Patient was discharged home with metoclopramide 4 times daily as needed and preferably take before each meals.  She is already on appropriate diabetes treatment.  Electrolytes are adequate.  Patient is high risk of readmission, she is also scheduled to see a GI doctor for gastric pacemaker evaluation.   Discharge Diagnoses:  Principal Problem:   Diabetic gastroparesis (HCC) Active Problems:   Sinus tachycardia   Essential hypertension   GERD without esophagitis   Gastroparesis   HLD (hyperlipidemia)   Uncontrolled type 1 diabetes mellitus with hyperglycemia, with long-term current use of insulin (HCC)   Hypomagnesemia   Nausea and vomiting    Discharge Instructions  Discharge Instructions     Call MD for:  persistant nausea and vomiting   Complete by: As directed    Call MD for:  severe uncontrolled pain   Complete by: As directed    Diet - low sodium heart healthy   Complete by: As  directed    Soft food and liquids , frequent small meals   Increase activity slowly   Complete by: As directed       Allergies as of 12/12/2021   No Known Allergies      Medication List     STOP taking these medications    Motegrity 2 MG Tabs Generic drug: Prucalopride Succinate   prochlorperazine 10 MG tablet Commonly known as: COMPAZINE   promethazine 12.5 MG tablet Commonly known as: PHENERGAN       TAKE these medications    atorvastatin 20 MG tablet Commonly known as: LIPITOR Take 1 tablet (20 mg total) by mouth daily.   carvedilol 12.5 MG tablet Commonly known as: COREG Take 1 tablet (12.5 mg total) by mouth 2 (two) times daily with a meal. What changed: how much to take   Dexcom G6 Sensor Misc Inject 1 Device into the skin See admin instructions. Place 1 sensor into the skin every 10 days after removing the former one   Dexcom G6 Transmitter Misc Change every 90 days   dicyclomine 20 MG tablet Commonly known as: BENTYL Take 1 tablet (20 mg total) by mouth 3 (three) times daily as needed for spasms.   gabapentin 300 MG capsule Commonly known as: NEURONTIN Take 300 mg by mouth 3 (three) times daily.   hydrOXYzine 25 MG capsule Commonly known as: VISTARIL Take 1 capsule (25 mg total) by mouth every 8 (eight) hours as needed. What changed: reasons to take this   insulin regular 100 units/mL injection Commonly known as: NovoLIN R Inject 0-0.15 mLs (0-15 Units total) into the skin  See admin instructions. Via pump.Start this medication after you follow up with your PCP What changed: additional instructions   Levemir FlexTouch 100 UNIT/ML FlexPen Generic drug: insulin detemir Inject 16 Units into the skin daily. What changed: when to take this   metoCLOPramide 10 MG tablet Commonly known as: REGLAN Take 1 tablet (10 mg total) by mouth every 8 (eight) hours as needed for nausea or vomiting. What changed: when to take this   ondansetron 4 MG  tablet Commonly known as: ZOFRAN Take 4 mg by mouth every 8 (eight) hours as needed for nausea or vomiting.   pantoprazole 40 MG tablet Commonly known as: PROTONIX Take 1 tablet (40 mg total) by mouth 2 (two) times daily before a meal. What changed: when to take this   TURMERIC PO Take 1 capsule by mouth daily with breakfast.        No Known Allergies  Consultations: Gastroenterology   Procedures/Studies: No results found. (Echo, Carotid, EGD, Colonoscopy, ERCP)    Subjective: Patient seen and examined.  No overnight events.  Tolerating full liquids.  She wants to go home.  She tells me that she has no issues today, no pain.   Discharge Exam: Vitals:   12/11/21 2019 12/12/21 0414  BP: (!) 149/96 135/80  Pulse: 95 100  Resp: 20 18  Temp: 98.7 F (37.1 C) 98.2 F (36.8 C)  SpO2: 100% 99%   Vitals:   12/11/21 0508 12/11/21 1422 12/11/21 2019 12/12/21 0414  BP: (!) 148/98 134/78 (!) 149/96 135/80  Pulse: (!) 110 67 95 100  Resp: 15  20 18   Temp: 99 F (37.2 C) 98.4 F (36.9 C) 98.7 F (37.1 C) 98.2 F (36.8 C)  TempSrc: Oral Axillary Oral Oral  SpO2: 97% 98% 100% 99%  Weight:      Height:        General: Pt is alert, awake, not in acute distress Cardiovascular: RRR, S1/S2 +, no rubs, no gallops Respiratory: CTA bilaterally, no wheezing, no rhonchi Abdominal: Soft, NT, ND, bowel sounds + Extremities: no edema, no cyanosis    The results of significant diagnostics from this hospitalization (including imaging, microbiology, ancillary and laboratory) are listed below for reference.     Microbiology: No results found for this or any previous visit (from the past 240 hour(s)).   Labs: BNP (last 3 results) No results for input(s): "BNP" in the last 8760 hours. Basic Metabolic Panel: Recent Labs  Lab 12/07/21 0105 12/07/21 0851 12/08/21 0601 12/09/21 0532 12/11/21 0457  NA 138  --  134* 130* 133*  K 4.5  --  3.8 3.9 4.5  CL 109  --  106 98 104   CO2 20*  --  21* 21* 21*  GLUCOSE 313*  --  236* 189* 114*  BUN 18  --  18 14 13   CREATININE 1.22*  --  0.95 0.93 1.11*  CALCIUM 9.6  --  8.4* 9.4 8.7*  MG  --  1.5*  --  1.6* 1.6*  PHOS  --   --   --   --  3.8   Liver Function Tests: Recent Labs  Lab 12/07/21 0105 12/08/21 0601  AST 18 14*  ALT 18 13  ALKPHOS 86 67  BILITOT 0.8 0.9  PROT 7.4 5.5*  ALBUMIN 3.5 2.6*   Recent Labs  Lab 12/07/21 0105  LIPASE 27   No results for input(s): "AMMONIA" in the last 168 hours. CBC: Recent Labs  Lab 12/07/21 0105 12/07/21 0851 12/07/21  1451 12/07/21 2040 12/08/21 0601 12/09/21 0532 12/11/21 0457  WBC 7.4  --   --   --  9.6 9.4 8.6  NEUTROABS  --   --   --   --   --   --  2.9  HGB 12.3   < > 9.9* 10.0* 9.7* 12.9 9.6*  HCT 38.8   < > 30.2* 30.9* 29.8* 39.5 30.0*  MCV 92.2  --   --   --  90.0 88.8 90.4  PLT 380  --   --   --  371 307 370   < > = values in this interval not displayed.   Cardiac Enzymes: No results for input(s): "CKTOTAL", "CKMB", "CKMBINDEX", "TROPONINI" in the last 168 hours. BNP: Invalid input(s): "POCBNP" CBG: Recent Labs  Lab 12/11/21 1156 12/11/21 1746 12/11/21 2220 12/12/21 0758 12/12/21 1136  GLUCAP 175* 148* 133* 112* 190*   D-Dimer No results for input(s): "DDIMER" in the last 72 hours. Hgb A1c No results for input(s): "HGBA1C" in the last 72 hours. Lipid Profile No results for input(s): "CHOL", "HDL", "LDLCALC", "TRIG", "CHOLHDL", "LDLDIRECT" in the last 72 hours. Thyroid function studies No results for input(s): "TSH", "T4TOTAL", "T3FREE", "THYROIDAB" in the last 72 hours.  Invalid input(s): "FREET3" Anemia work up No results for input(s): "VITAMINB12", "FOLATE", "FERRITIN", "TIBC", "IRON", "RETICCTPCT" in the last 72 hours. Urinalysis    Component Value Date/Time   COLORURINE YELLOW 12/07/2021 0836   APPEARANCEUR CLEAR 12/07/2021 0836   LABSPEC 1.025 12/07/2021 0836   PHURINE 6.0 12/07/2021 0836   GLUCOSEU >=500 (A)  12/07/2021 0836   GLUCOSEU NEGATIVE 05/22/2020 1417   HGBUR MODERATE (A) 12/07/2021 0836   BILIRUBINUR NEGATIVE 12/07/2021 0836   KETONESUR 15 (A) 12/07/2021 0836   PROTEINUR >300 (A) 12/07/2021 0836   UROBILINOGEN 0.2 05/22/2020 1417   NITRITE NEGATIVE 12/07/2021 0836   LEUKOCYTESUR NEGATIVE 12/07/2021 0836   Sepsis Labs Recent Labs  Lab 12/07/21 0105 12/08/21 0601 12/09/21 0532 12/11/21 0457  WBC 7.4 9.6 9.4 8.6   Microbiology No results found for this or any previous visit (from the past 240 hour(s)).   Time coordinating discharge: 28 minutes  SIGNED:   Dorcas Carrow, MD  [facility] 12/12/2021, 12:44 PM

## 2021-12-12 NOTE — Progress Notes (Signed)
  Transition of Care [facility]) Screening Note   Patient Details  Name: Kelli Hudson Date of Birth: 01/16/1991   Transition of Care [facility]) CM/SW Contact:    Vassie Moselle, LCSW Phone Number: 12/12/2021, 8:59 AM    Transition of Care Department [facility]) has reviewed patient and no TOC needs have been identified at this time. We will continue to monitor patient advancement through interdisciplinary progression rounds. If new patient transition needs arise, please place a TOC consult.

## 2021-12-12 NOTE — Progress Notes (Signed)
Mobility Specialist - Progress Note   12/12/21 1131  Mobility  Activity Ambulated with assistance in hallway  Level of Assistance Independent  Assistive Device  (IV Pole)  Distance Ambulated (ft) 500 ft  Activity Response Tolerated well  Mobility Referral Yes  $Mobility charge 1 Mobility   Pt received in bed and agreeable to mobility. No complaints during mobility. Pt to bed after session with all needs met.    [facility]

## 2021-12-12 NOTE — Plan of Care (Signed)
  Problem: Education: Goal: Ability to describe self-care measures that may prevent or decrease complications (Diabetes Survival Skills Education) will improve Outcome: Progressing Goal: Individualized Educational Video(s) Outcome: Progressing   Problem: Coping: Goal: Ability to adjust to condition or change in health will improve Outcome: Progressing   Problem: Fluid Volume: Goal: Ability to maintain a balanced intake and output will improve Outcome: Progressing   Problem: Metabolic: Goal: Ability to maintain appropriate glucose levels will improve Outcome: Progressing   Problem: Nutritional: Goal: Maintenance of adequate nutrition will improve Outcome: Progressing Goal: Progress toward achieving an optimal weight will improve Outcome: Progressing   Problem: Skin Integrity: Goal: Risk for impaired skin integrity will decrease Outcome: Progressing   Problem: Tissue Perfusion: Goal: Adequacy of tissue perfusion will improve Outcome: Progressing   Problem: Education: Goal: Knowledge of General Education information will improve Description: Including pain rating scale, medication(s)/side effects and non-pharmacologic comfort measures Outcome: Progressing   Problem: Health Behavior/Discharge Planning: Goal: Ability to manage health-related needs will improve Outcome: Progressing   Problem: Clinical Measurements: Goal: Ability to maintain clinical measurements within normal limits will improve Outcome: Progressing Goal: Will remain free from infection Outcome: Progressing Goal: Diagnostic test results will improve Outcome: Progressing Goal: Respiratory complications will improve Outcome: Progressing Goal: Cardiovascular complication will be avoided Outcome: Progressing   Problem: Nutrition: Goal: Adequate nutrition will be maintained Outcome: Progressing   Problem: Coping: Goal: Level of anxiety will decrease Outcome: Progressing   Problem: Pain  Managment: Goal: General experience of comfort will improve Outcome: Progressing   Problem: Skin Integrity: Goal: Risk for impaired skin integrity will decrease Outcome: Progressing

## 2021-12-13 ENCOUNTER — Telehealth: Payer: Self-pay

## 2021-12-13 NOTE — Telephone Encounter (Signed)
Transition Care Management Unsuccessful Follow-up Telephone Call  Date of discharge and from where:  12/11/21 [facility]  Attempts:  3rd Attempt  Reason for unsuccessful TCM follow-up call:  Left voice message

## 2021-12-13 NOTE — Telephone Encounter (Addendum)
Transition Care Management Unsuccessful Follow-up Telephone Call  Date of discharge and from where:  12/12/21 [facility]. Dx: Gastroparesis.  Attempts:  1st Attempt  Reason for unsuccessful TCM follow-up call:  Left voice message

## 2021-12-13 NOTE — Chronic Care Management (AMB) (Signed)
  Care Coordination Note  12/13/2021 Name: Kelli Hudson MRN: [id number] DOB: Dec 27, 1990  Kelli Hudson is a 30 y.o. year old female who is a primary care patient of [name], MD and is actively engaged with the care management team. I reached out to [employer] by phone today to assist with re-scheduling a follow up visit with the RN Case Manager  Follow up plan: 2nd Unsuccessful telephone outreach attempt made. A HIPAA compliant phone message was left for the patient providing contact information and requesting a return call.   Julian Hy, [place] Direct Dial: [phone]

## 2021-12-19 NOTE — Telephone Encounter (Signed)
Transition Care Management Unsuccessful Follow-up Telephone Call  Date of discharge and from where:  12/12/21 [facility]. Dx: Gastroparesis.    Attempts:  2nd Attempt  Reason for unsuccessful TCM follow-up call:  Unable to leave message   VM box full.

## 2021-12-20 NOTE — Telephone Encounter (Signed)
Transition Care Management Unsuccessful Follow-up Telephone Call  Date of discharge and from where:  12/12/21 [facility]. Dx: Gastroparesis  Attempts:  3rd Attempt  Reason for unsuccessful TCM follow-up call:  Left voice message

## 2021-12-20 NOTE — Progress Notes (Signed)
  Care Coordination Note  12/20/2021 Name: Kelli Hudson MRN: [id number] DOB: Oct 27, 1990  Kelli Hudson is a 31 y.o. year old female who is a primary care patient of [name], MD and is actively engaged with the care management team. I reached out to [employer] by phone today to assist with re-scheduling a follow up visit with the RN Case Manager  Follow up plan: We have been unable to make contact with the patient for follow up.    Julian Hy, [place] Direct Dial: [phone]

## 2021-12-23 DIAGNOSIS — R6881 Early satiety: Secondary | ICD-10-CM | POA: Diagnosis not present

## 2021-12-23 DIAGNOSIS — R630 Anorexia: Secondary | ICD-10-CM | POA: Diagnosis not present

## 2021-12-23 DIAGNOSIS — R110 Nausea: Secondary | ICD-10-CM | POA: Diagnosis not present

## 2021-12-23 DIAGNOSIS — R1084 Generalized abdominal pain: Secondary | ICD-10-CM | POA: Diagnosis not present

## 2021-12-23 DIAGNOSIS — K3184 Gastroparesis: Secondary | ICD-10-CM | POA: Diagnosis not present

## 2021-12-24 ENCOUNTER — Telehealth: Payer: Self-pay

## 2021-12-24 NOTE — Telephone Encounter (Signed)
Received a call from [place]on at [facility]. She is reviewing patient's records and asked that we fax over [place] that was completed in 04/2020 Mendel Ryder asked that I fax it to [phone]. I have printed and faxed [facility]ES as requested.

## 2021-12-25 ENCOUNTER — Inpatient Hospital Stay (HOSPITAL_COMMUNITY)
Admission: EM | Admit: 2021-12-25 | Discharge: 2021-12-30 | DRG: 074 | Disposition: A | Discharge status: Home / Self Care | Payer: Medicaid Other | Attending: Internal Medicine | Admitting: Internal Medicine

## 2021-12-25 ENCOUNTER — Encounter (HOSPITAL_COMMUNITY): Payer: Self-pay

## 2021-12-25 DIAGNOSIS — F1120 Opioid dependence, uncomplicated: Secondary | ICD-10-CM | POA: Diagnosis present

## 2021-12-25 DIAGNOSIS — Z79899 Other long term (current) drug therapy: Secondary | ICD-10-CM

## 2021-12-25 DIAGNOSIS — R112 Nausea with vomiting, unspecified: Secondary | ICD-10-CM | POA: Diagnosis present

## 2021-12-25 DIAGNOSIS — K3184 Gastroparesis: Secondary | ICD-10-CM | POA: Diagnosis not present

## 2021-12-25 DIAGNOSIS — K219 Gastro-esophageal reflux disease without esophagitis: Secondary | ICD-10-CM | POA: Diagnosis not present

## 2021-12-25 DIAGNOSIS — E1165 Type 2 diabetes mellitus with hyperglycemia: Secondary | ICD-10-CM | POA: Diagnosis present

## 2021-12-25 DIAGNOSIS — Z89422 Acquired absence of other left toe(s): Secondary | ICD-10-CM

## 2021-12-25 DIAGNOSIS — Z833 Family history of diabetes mellitus: Secondary | ICD-10-CM | POA: Diagnosis not present

## 2021-12-25 DIAGNOSIS — E0590 Thyrotoxicosis, unspecified without thyrotoxic crisis or storm: Secondary | ICD-10-CM | POA: Diagnosis present

## 2021-12-25 DIAGNOSIS — E8889 Other specified metabolic disorders: Secondary | ICD-10-CM | POA: Diagnosis not present

## 2021-12-25 DIAGNOSIS — E860 Dehydration: Secondary | ICD-10-CM | POA: Diagnosis not present

## 2021-12-25 DIAGNOSIS — N179 Acute kidney failure, unspecified: Secondary | ICD-10-CM | POA: Diagnosis present

## 2021-12-25 DIAGNOSIS — E1143 Type 2 diabetes mellitus with diabetic autonomic (poly)neuropathy: Secondary | ICD-10-CM | POA: Diagnosis not present

## 2021-12-25 DIAGNOSIS — I10 Essential (primary) hypertension: Secondary | ICD-10-CM | POA: Diagnosis present

## 2021-12-25 DIAGNOSIS — Z794 Long term (current) use of insulin: Secondary | ICD-10-CM

## 2021-12-25 DIAGNOSIS — R69 Illness, unspecified: Secondary | ICD-10-CM | POA: Diagnosis not present

## 2021-12-25 DIAGNOSIS — Z765 Malingerer [conscious simulation]: Secondary | ICD-10-CM

## 2021-12-25 DIAGNOSIS — R000 Tachycardia, unspecified: Secondary | ICD-10-CM | POA: Diagnosis not present

## 2021-12-25 LAB — CBC WITH DIFFERENTIAL/PLATELET
Abs Immature Granulocytes: 0.03 10*3/uL (ref 0.00–0.07)
Basophils Absolute: 0 10*3/uL (ref 0.0–0.1)
Basophils Relative: 0 %
Eosinophils Absolute: 0.1 10*3/uL (ref 0.0–0.5)
Eosinophils Relative: 1 %
HCT: 38.4 % (ref 36.0–46.0)
Hemoglobin: 12.4 g/dL (ref 12.0–15.0)
Immature Granulocytes: 0 %
Lymphocytes Relative: 14 %
Lymphs Abs: 1.3 10*3/uL (ref 0.7–4.0)
MCH: 28.5 pg (ref 26.0–34.0)
MCHC: 32.3 g/dL (ref 30.0–36.0)
MCV: 88.3 fL (ref 80.0–100.0)
Monocytes Absolute: 0.4 10*3/uL (ref 0.1–1.0)
Monocytes Relative: 4 %
Neutro Abs: 7.5 10*3/uL (ref 1.7–7.7)
Neutrophils Relative %: 81 %
Platelets: 428 10*3/uL — ABNORMAL HIGH (ref 150–400)
RBC: 4.35 MIL/uL (ref 3.87–5.11)
RDW: 13 % (ref 11.5–15.5)
WBC: 9.3 10*3/uL (ref 4.0–10.5)
nRBC: 0 % (ref 0.0–0.2)

## 2021-12-25 LAB — URINALYSIS, ROUTINE W REFLEX MICROSCOPIC
Bacteria, UA: NONE SEEN
Bilirubin Urine: NEGATIVE
Glucose, UA: >=500 mg/dL — AB
Ketones, ur: 20 mg/dL — AB
Leukocytes,Ua: NEGATIVE
Nitrite: NEGATIVE
Protein, ur: >=300 mg/dL — AB
Specific Gravity, Urine: 1.018 (ref 1.005–1.030)
pH: 6 (ref 5.0–8.0)

## 2021-12-25 LAB — COMPREHENSIVE METABOLIC PANEL
ALT: 20 U/L (ref 0–44)
AST: 22 U/L (ref 15–41)
Albumin: 3.5 g/dL (ref 3.5–5.0)
Alkaline Phosphatase: 111 U/L (ref 38–126)
Anion gap: 12 (ref 5–15)
BUN: 24 mg/dL — ABNORMAL HIGH (ref 6–20)
CO2: 24 mmol/L (ref 22–32)
Calcium: 9.8 mg/dL (ref 8.9–10.3)
Chloride: 100 mmol/L (ref 98–111)
Creatinine, Ser: 1.22 mg/dL — ABNORMAL HIGH (ref 0.44–1.00)
GFR, Estimated: >60 mL/min (ref >60)
Glucose, Bld: 407 mg/dL — ABNORMAL HIGH (ref 70–99)
Potassium: 4.5 mmol/L (ref 3.5–5.1)
Sodium: 136 mmol/L (ref 135–145)
Total Bilirubin: 1.1 mg/dL (ref 0.3–1.2)
Total Protein: 7.8 g/dL (ref 6.5–8.1)

## 2021-12-25 LAB — CBC
HCT: 38.9 % (ref 36.0–46.0)
Hemoglobin: 12.7 g/dL (ref 12.0–15.0)
MCH: 29.1 pg (ref 26.0–34.0)
MCHC: 32.6 g/dL (ref 30.0–36.0)
MCV: 89 fL (ref 80.0–100.0)
Platelets: 435 10*3/uL — ABNORMAL HIGH (ref 150–400)
RBC: 4.37 MIL/uL (ref 3.87–5.11)
RDW: 13.2 % (ref 11.5–15.5)
WBC: 11.1 10*3/uL — ABNORMAL HIGH (ref 4.0–10.5)
nRBC: 0 % (ref 0.0–0.2)

## 2021-12-25 LAB — BASIC METABOLIC PANEL
Anion gap: 17 — ABNORMAL HIGH (ref 5–15)
BUN: 22 mg/dL — ABNORMAL HIGH (ref 6–20)
CO2: 21 mmol/L — ABNORMAL LOW (ref 22–32)
Calcium: 9.5 mg/dL (ref 8.9–10.3)
Chloride: 99 mmol/L (ref 98–111)
Creatinine, Ser: 1.07 mg/dL — ABNORMAL HIGH (ref 0.44–1.00)
GFR, Estimated: >60 mL/min (ref >60)
Glucose, Bld: 409 mg/dL — ABNORMAL HIGH (ref 70–99)
Potassium: 4.5 mmol/L (ref 3.5–5.1)
Sodium: 137 mmol/L (ref 135–145)

## 2021-12-25 LAB — BLOOD GAS, VENOUS
Acid-base deficit: 1.8 mmol/L (ref 0.0–2.0)
Bicarbonate: 23.7 mmol/L (ref 20.0–28.0)
O2 Saturation: 41.2 %
Patient temperature: 37
pCO2, Ven: 42 mmHg — ABNORMAL LOW (ref 44–60)
pH, Ven: 7.36 (ref 7.25–7.43)
pO2, Ven: <31 mmHg — CL (ref 32–45)

## 2021-12-25 LAB — I-STAT BETA HCG BLOOD, ED (MC, WL, AP ONLY): I-stat hCG, quantitative: <5 m[IU]/mL (ref <5)

## 2021-12-25 LAB — CBG MONITORING, ED
Glucose-Capillary: 409 mg/dL — ABNORMAL HIGH (ref 70–99)
Glucose-Capillary: 385 mg/dL — ABNORMAL HIGH (ref 70–99)

## 2021-12-25 LAB — TSH: TSH: 1.3 u[IU]/mL (ref 0.350–4.500)

## 2021-12-25 LAB — LIPASE, BLOOD: Lipase: 27 U/L (ref 11–51)

## 2021-12-25 MED ORDER — ALBUTEROL SULFATE (2.5 MG/3ML) 0.083% IN NEBU: 2.5000 mg | INHALATION_SOLUTION | RESPIRATORY_TRACT | Status: DC | PRN

## 2021-12-25 MED ORDER — METOCLOPRAMIDE HCL 5 MG/ML IJ SOLN
10.0000 mg | Freq: Four times a day (QID) | INTRAMUSCULAR | Status: DC | PRN
  Administered 2021-12-26: 10 mg via INTRAVENOUS
  Filled 2021-12-25: qty 2

## 2021-12-25 MED ORDER — METOCLOPRAMIDE HCL 10 MG PO TABS: 10.0000 mg | ORAL_TABLET | Freq: Three times a day (TID) | ORAL | Status: DC | PRN

## 2021-12-25 MED ORDER — SODIUM CHLORIDE 0.45 % IV SOLN: INTRAVENOUS | Status: DC

## 2021-12-25 MED ORDER — FENTANYL CITRATE PF 50 MCG/ML IJ SOSY
12.5000 ug | PREFILLED_SYRINGE | INTRAMUSCULAR | Status: DC | PRN
  Administered 2021-12-25 – 2021-12-26 (×6): 50 ug via INTRAVENOUS
  Administered 2021-12-27: 12.5 ug via INTRAVENOUS
  Filled 2021-12-25 (×7): qty 1

## 2021-12-25 MED ORDER — SODIUM CHLORIDE 0.9 % IV BOLUS
1000.0000 mL | Freq: Once | INTRAVENOUS | Status: AC
  Administered 2021-12-25: 1000 mL via INTRAVENOUS

## 2021-12-25 MED ORDER — GABAPENTIN 300 MG PO CAPS
300.0000 mg | ORAL_CAPSULE | Freq: Three times a day (TID) | ORAL | Status: DC
  Administered 2021-12-25 – 2021-12-30 (×14): 300 mg via ORAL
  Filled 2021-12-25 (×14): qty 1

## 2021-12-25 MED ORDER — ATORVASTATIN CALCIUM 20 MG PO TABS
20.0000 mg | ORAL_TABLET | Freq: Every day | ORAL | Status: DC
  Administered 2021-12-25 – 2021-12-30 (×6): 20 mg via ORAL
  Filled 2021-12-25 (×3): qty 1
  Filled 2021-12-25: qty 2
  Filled 2021-12-25: qty 1
  Filled 2021-12-25: qty 2

## 2021-12-25 MED ORDER — INSULIN ASPART 100 UNIT/ML IJ SOLN
0.0000 [IU] | INTRAMUSCULAR | Status: DC
  Administered 2021-12-25: 9 [IU] via SUBCUTANEOUS
  Administered 2021-12-26: 3 [IU] via SUBCUTANEOUS
  Administered 2021-12-26: 5 [IU] via SUBCUTANEOUS
  Administered 2021-12-26 (×2): 3 [IU] via SUBCUTANEOUS
  Administered 2021-12-26: 7 [IU] via SUBCUTANEOUS
  Administered 2021-12-26 (×2): 2 [IU] via SUBCUTANEOUS
  Administered 2021-12-27: 5 [IU] via SUBCUTANEOUS
  Administered 2021-12-27: 1 [IU] via SUBCUTANEOUS
  Administered 2021-12-27: 5 [IU] via SUBCUTANEOUS
  Administered 2021-12-27: 2 [IU] via SUBCUTANEOUS
  Administered 2021-12-27: 1 [IU] via SUBCUTANEOUS
  Administered 2021-12-28: 3 [IU] via SUBCUTANEOUS
  Administered 2021-12-28: 1 [IU] via SUBCUTANEOUS
  Administered 2021-12-28: 5 [IU] via SUBCUTANEOUS
  Administered 2021-12-28: 1 [IU] via SUBCUTANEOUS
  Administered 2021-12-28 – 2021-12-29 (×3): 2 [IU] via SUBCUTANEOUS
  Administered 2021-12-29: 1 [IU] via SUBCUTANEOUS
  Administered 2021-12-29: 3 [IU] via SUBCUTANEOUS
  Administered 2021-12-30 (×2): 1 [IU] via SUBCUTANEOUS
  Filled 2021-12-25: qty 0.09

## 2021-12-25 MED ORDER — CARVEDILOL 6.25 MG PO TABS
6.2500 mg | ORAL_TABLET | Freq: Two times a day (BID) | ORAL | Status: DC
  Administered 2021-12-26 – 2021-12-30 (×9): 6.25 mg via ORAL
  Filled 2021-12-25 (×8): qty 1
  Filled 2021-12-25: qty 2

## 2021-12-25 MED ORDER — DIPHENHYDRAMINE HCL 50 MG/ML IJ SOLN
50.0000 mg | Freq: Once | INTRAMUSCULAR | Status: AC
  Administered 2021-12-25: 50 mg via INTRAVENOUS
  Filled 2021-12-25: qty 1

## 2021-12-25 MED ORDER — METOCLOPRAMIDE HCL 5 MG/ML IJ SOLN
10.0000 mg | Freq: Once | INTRAMUSCULAR | Status: AC
  Administered 2021-12-25: 10 mg via INTRAVENOUS
  Filled 2021-12-25: qty 2

## 2021-12-25 MED ORDER — HEPARIN SODIUM (PORCINE) 5000 UNIT/ML IJ SOLN
5000.0000 [IU] | Freq: Three times a day (TID) | INTRAMUSCULAR | Status: DC
  Administered 2021-12-25 – 2021-12-30 (×13): 5000 [IU] via SUBCUTANEOUS
  Filled 2021-12-25 (×12): qty 1

## 2021-12-25 MED ORDER — DICYCLOMINE HCL 20 MG PO TABS
20.0000 mg | ORAL_TABLET | Freq: Three times a day (TID) | ORAL | Status: DC | PRN
  Administered 2021-12-27 – 2021-12-29 (×4): 20 mg via ORAL
  Filled 2021-12-25 (×4): qty 1

## 2021-12-25 MED ORDER — INSULIN DETEMIR 100 UNIT/ML ~~LOC~~ SOLN
16.0000 [IU] | SUBCUTANEOUS | Status: DC
  Administered 2021-12-25 – 2021-12-29 (×5): 16 [IU] via SUBCUTANEOUS
  Filled 2021-12-25 (×5): qty 0.16

## 2021-12-25 MED ORDER — CARVEDILOL 12.5 MG PO TABS
12.5000 mg | ORAL_TABLET | Freq: Once | ORAL | Status: AC
  Administered 2021-12-25: 12.5 mg via ORAL
  Filled 2021-12-25: qty 1

## 2021-12-25 MED ORDER — FAMOTIDINE IN NACL 20-0.9 MG/50ML-% IV SOLN
20.0000 mg | Freq: Once | INTRAVENOUS | Status: AC
  Administered 2021-12-25: 20 mg via INTRAVENOUS
  Filled 2021-12-25: qty 50

## 2021-12-25 MED ORDER — MORPHINE SULFATE (PF) 4 MG/ML IV SOLN
4.0000 mg | Freq: Once | INTRAVENOUS | Status: AC
  Administered 2021-12-25: 4 mg via INTRAVENOUS
  Filled 2021-12-25: qty 1

## 2021-12-25 MED ORDER — KETOROLAC TROMETHAMINE 15 MG/ML IJ SOLN
15.0000 mg | Freq: Once | INTRAMUSCULAR | Status: AC
  Administered 2021-12-25: 15 mg via INTRAVENOUS
  Filled 2021-12-25: qty 1

## 2021-12-25 MED ORDER — HYDROXYZINE PAMOATE 25 MG PO CAPS: 25.0000 mg | ORAL_CAPSULE | Freq: Three times a day (TID) | ORAL | Status: DC | PRN

## 2021-12-25 MED ORDER — HYDROXYZINE HCL 25 MG PO TABS: 25.0000 mg | ORAL_TABLET | Freq: Three times a day (TID) | ORAL | Status: DC | PRN

## 2021-12-25 MED ORDER — FAMOTIDINE IN NACL 20-0.9 MG/50ML-% IV SOLN
20.0000 mg | Freq: Two times a day (BID) | INTRAVENOUS | Status: DC
  Administered 2021-12-26 (×2): 20 mg via INTRAVENOUS
  Filled 2021-12-25 (×2): qty 50

## 2021-12-25 NOTE — ED Notes (Addendum)
Pt has urine cup. Pt in lobby asleep. Will prompt pt once she wakes up to provide sample.

## 2021-12-25 NOTE — ED Provider Notes (Signed)
[facility]-EMERGENCY DEPT Provider Note   CSN: [id number] Arrival date & time: 12/25/21  1211     History  Chief Complaint  Patient presents with   Abdominal Pain   Emesis    Kelli Hudson is a 31 y.o. female.   Abdominal Pain Associated symptoms: vomiting   Emesis Associated symptoms: abdominal pain   Patient with history of gastroparesis.  Nausea and vomiting since yesterday.  No relief with medicine at home.  History of frequent episodes of gastroparesis.  Saw new specialist in [place] 2 days ago with plan to see surgeon for gastric pacemaker.  States now flaring up.  No relief with her medicines at home.    Past Medical History:  Diagnosis Date   Acute H. pylori gastric ulcer    Coffee ground emesis    Diabetes mellitus (HCC)    Diabetic gastroparesis (HCC)    DKA (diabetic ketoacidosis) (HCC) 02/24/2021   Gastroparesis    GERD (gastroesophageal reflux disease)    Hypertension    Hyperthyroidism    Intractable nausea and vomiting 04/20/2021   Normocytic anemia 04/20/2020   Prolonged Q-T interval on ECG     Home Medications Prior to Admission medications   Medication Sig Start Date End Date Taking? Authorizing Provider  atorvastatin (LIPITOR) 20 MG tablet Take 1 tablet (20 mg total) by mouth daily. 10/29/21   Loyola Mast, MD  carvedilol (COREG) 12.5 MG tablet Take 1 tablet (12.5 mg total) by mouth 2 (two) times daily with a meal. Patient taking differently: Take 6.25 mg by mouth 2 (two) times daily with a meal. 11/08/21   Kathlen Mody, MD  Continuous Blood Gluc Sensor (DEXCOM G6 SENSOR) MISC Inject 1 Device into the skin See admin instructions. Place 1 sensor into the skin every 10 days after removing the former one    [provider]  Continuous Blood Gluc Transmit (DEXCOM G6 TRANSMITTER) MISC Change every 90 days 11/29/21   Shamleffer, Konrad Dolores, MD  dicyclomine (BENTYL) 20 MG tablet Take 1 tablet (20 mg total) by  mouth 3 (three) times daily as needed for spasms. 11/08/21   Kathlen Mody, MD  gabapentin (NEURONTIN) 300 MG capsule Take 300 mg by mouth 3 (three) times daily. 10/24/21   [provider]  hydrOXYzine (VISTARIL) 25 MG capsule Take 1 capsule (25 mg total) by mouth every 8 (eight) hours as needed. Patient taking differently: Take 25 mg by mouth every 8 (eight) hours as needed for anxiety. 07/31/21   Alwyn Ren, MD  insulin detemir (LEVEMIR FLEXTOUCH) 100 UNIT/ML FlexPen Inject 16 Units into the skin daily. Patient taking differently: Inject 16 Units into the skin daily before breakfast. 06/14/21   Burnadette Pop, MD  insulin regular (NOVOLIN R) 100 units/mL injection Inject 0-0.15 mLs (0-15 Units total) into the skin See admin instructions. Via pump.Start this medication after you follow up with your PCP Patient taking differently: Inject 0-15 Units into the skin See admin instructions. Inject 0-15 units into the skin three times a day with meals, per sliding scale 06/14/21   Burnadette Pop, MD  metoCLOPramide (REGLAN) 10 MG tablet Take 1 tablet (10 mg total) by mouth every 8 (eight) hours as needed for nausea or vomiting. Patient taking differently: Take 10 mg by mouth 3 (three) times daily before meals. 11/08/21   Kathlen Mody, MD  ondansetron (ZOFRAN) 4 MG tablet Take 4 mg by mouth every 8 (eight) hours as needed for nausea or vomiting.    [provider]  pantoprazole (PROTONIX) 40 MG tablet Take 1 tablet (40 mg total) by mouth 2 (two) times daily before a meal. Patient taking differently: Take 40 mg by mouth daily before breakfast. 11/08/21   Kathlen Mody, MD  TURMERIC PO Take 1 capsule by mouth daily with breakfast.    [provider]  insulin aspart (NOVOLOG) 100 UNIT/ML FlexPen Inject 5 Units into the skin 3 (three) times daily with meals. 02/23/18 07/05/19  Jerald Kief, MD      Allergies    Patient has no known allergies.    Review of Systems   Review of  Systems  Gastrointestinal:  Positive for abdominal pain and vomiting.    Physical Exam Updated Vital Signs BP (!) 185/79 (BP Location: Left Arm)   Pulse (!) 108   Temp 98.4 F (36.9 C) (Oral)   Resp 18   SpO2 93%  Physical Exam Vitals and nursing note reviewed.  Cardiovascular:     Rate and Rhythm: Regular rhythm. Tachycardia present.  Pulmonary:     Breath sounds: No wheezing or rhonchi.  Abdominal:     Hernia: No hernia is present.     Comments: Mild upper abdominal tenderness without rebound or guarding.  No hernia palpated.  Skin:    General: Skin is warm.     Capillary Refill: Capillary refill takes less than 2 seconds.  Neurological:     Mental Status: She is alert.     ED Results / Procedures / Treatments   Labs (all labs ordered are listed, but only abnormal results are displayed) Labs Reviewed  CBC WITH DIFFERENTIAL/PLATELET - Abnormal; Notable for the following components:      Result Value   Platelets 428 (*)    All other components within normal limits  COMPREHENSIVE METABOLIC PANEL - Abnormal; Notable for the following components:   Glucose, Bld 407 (*)    BUN 24 (*)    Creatinine, Ser 1.22 (*)    All other components within normal limits  CBG MONITORING, ED - Abnormal; Notable for the following components:   Glucose-Capillary 409 (*)    All other components within normal limits  LIPASE, BLOOD  URINALYSIS, ROUTINE W REFLEX MICROSCOPIC  I-STAT BETA HCG BLOOD, ED ([facility], [facility], [facility] ONLY)    EKG None  Radiology No results found.  Procedures Procedures    Medications Ordered in ED Medications  sodium chloride 0.9 % bolus 1,000 mL (has no administration in time range)  metoCLOPramide (REGLAN) injection 10 mg (10 mg Intravenous Given 12/25/21 1325)    ED Course/ Medical Decision Making/ A&P                           Medical Decision Making Risk Prescription drug management.   Patient with nausea and abdominal pain.  History of gastroparesis.   Somewhat severe with frequent recent admissions.  Also previous history of prolonged QT syndrome.  Will get EKG to evaluate but will treat symptomatically.  We will get basic blood work.  Reviewed recent admission notes. Has not been sent to a room yet.  Will be seen by oncoming provider.         Final Clinical Impression(s) / ED Diagnoses Final diagnoses:  None    Rx / DC Orders ED Discharge Orders     None         Benjiman Core, MD 12/25/21 1642

## 2021-12-25 NOTE — ED Triage Notes (Signed)
Pt presents with c/o abdominal pain and emesis that started this morning. Pt reports hx of gastroparesis.

## 2021-12-25 NOTE — ED Notes (Signed)
Pt is asleep. Will update vitals when pt is awake.

## 2021-12-25 NOTE — H&P (Signed)
History and Physical    Kelli Hudson [id number] DOB: 03-01-1990 DOA: 12/25/2021  PCP: [name], MD  Patient coming from: home  I have personally briefly reviewed patient's old medical records in [employer]  Chief Complaint:  n/v/abdominal pain   HPI: Kelli Hudson is a 31 y.o. female with medical history significant of DMII uncontrolled with hyperglycemia, complicated by severe gastroparesis, GERD, Hypertension, Hyperthyroidism, Prolonged QT on ECG, who presents to ED with abdominal pain n/v that started this am, not improved with home regimen.  Patient states she is having flare of her gastroparesis. She notes no inciting event or food. She states her last flare was 2 weeks ago. She states abdominal pain is burning /stabbing in nature. She notes she has had some relief with morphine in ED . She states she still is unable to take po despite treatment in ED. She notes she has not has any medications today. She states she is awaiting  evaluation for gastric pacer. She denies fever/chills, diarrhea / dysuria/ chest pain / or sob.   ED Course:  Tmx:99.9, BP 177/138 repeat * 185/79, hr 108, rr 18, sat 100%   Fs:409 eKg: sinus tachycardia QT 409 Labs: Wbc:9.3, Hgb 12.4, plt 428 NA 136, K 4.5, glu 407,  cr 1.22/base 0.93 AG 12, bicarb 24 Ua neg bact/LE, >300 protein,+ketone Lipase 27 Tx reglan/benadryl/morphine, toradol , pepcid ,  Carvediolol tab Review of Systems: As per HPI otherwise 10 point review of systems negative.   Past Medical History:  Diagnosis Date   Acute H. pylori gastric ulcer    Coffee ground emesis    Diabetes mellitus ([place])    Diabetic gastroparesis ([place])    DKA (diabetic ketoacidosis) ([place]) 02/24/2021   Gastroparesis    GERD (gastroesophageal reflux disease)    Hypertension    Hyperthyroidism    Intractable nausea and vomiting 04/20/2021   Normocytic anemia 04/20/2020   Prolonged Q-T interval on ECG     Past Surgical History:   Procedure Laterality Date   AMPUTATION TOE Left 03/10/2018   Procedure: AMPUTATION FIFTH TOE;  Surgeon: Trula Slade, DPM;  Location: [place];  Service: Podiatry;  Laterality: Left;   BIOPSY  01/28/2020   Procedure: BIOPSY;  Surgeon: Otis Brace, MD;  Location: [facility];  Service: Gastroenterology;;   BOTOX INJECTION  08/26/2021   Procedure: BOTOX INJECTION;  Surgeon: Doran Stabler, MD;  Location: [facility];  Service: Gastroenterology;;   ESOPHAGOGASTRODUODENOSCOPY N/A 01/28/2020   Procedure: ESOPHAGOGASTRODUODENOSCOPY (EGD);  Surgeon: Otis Brace, MD;  Location: [facility];  Service: Gastroenterology;  Laterality: N/A;   ESOPHAGOGASTRODUODENOSCOPY (EGD) WITH PROPOFOL Left 09/08/2015   Procedure: ESOPHAGOGASTRODUODENOSCOPY (EGD) WITH PROPOFOL;  Surgeon: Arta Silence, MD;  Location: [facility];  Service: Endoscopy;  Laterality: Left;   ESOPHAGOGASTRODUODENOSCOPY (EGD) WITH PROPOFOL N/A 08/26/2021   Procedure: ESOPHAGOGASTRODUODENOSCOPY (EGD) WITH PROPOFOL;  Surgeon: Doran Stabler, MD;  Location: [facility];  Service: Gastroenterology;  Laterality: N/A;   WISDOM TOOTH EXTRACTION       reports that she has never smoked. She has never used smokeless tobacco. She reports current alcohol use of about 1.0 standard drink of alcohol per week. She reports that she does not use drugs.  No Known Allergies  Family History  Problem Relation Age of Onset   Lung cancer Mother    Diabetes Mother    Bipolar disorder Father    Liver cancer Maternal Grandfather    Diabetes Maternal Aunt  x 2   Pancreatic cancer Maternal Uncle    Prostate cancer Maternal Uncle    Breast cancer Other        maternal great aunt   Heart disease Other    Ovarian cancer Other        maternal great aunt   Kidney disease Other        maternal great aunt   Colon cancer Neg Hx    Stomach cancer Neg Hx     Prior to Admission medications   Medication Sig  Start Date End Date Taking? Authorizing Provider  atorvastatin (LIPITOR) 20 MG tablet Take 1 tablet (20 mg total) by mouth daily. 10/29/21   [name], MD  carvedilol (COREG) 12.5 MG tablet Take 1 tablet (12.5 mg total) by mouth 2 (two) times daily with a meal. Patient taking differently: Take 6.25 mg by mouth 2 (two) times daily with a meal. 11/08/21   Hosie Poisson, MD  Continuous Blood Gluc Sensor (DEXCOM G6 SENSOR) MISC Inject 1 Device into the skin See admin instructions. Place 1 sensor into the skin every 10 days after removing the former one    [provider]  Continuous Blood Gluc Transmit (DEXCOM G6 TRANSMITTER) MISC Change every 90 days 11/29/21   Shamleffer, Melanie Crazier, MD  dicyclomine (BENTYL) 20 MG tablet Take 1 tablet (20 mg total) by mouth 3 (three) times daily as needed for spasms. 11/08/21   Hosie Poisson, MD  gabapentin (NEURONTIN) 300 MG capsule Take 300 mg by mouth 3 (three) times daily. 10/24/21   [provider]  hydrOXYzine (VISTARIL) 25 MG capsule Take 1 capsule (25 mg total) by mouth every 8 (eight) hours as needed. Patient taking differently: Take 25 mg by mouth every 8 (eight) hours as needed for anxiety. 07/31/21   Georgette Shell, MD  insulin detemir (LEVEMIR FLEXTOUCH) 100 UNIT/ML FlexPen Inject 16 Units into the skin daily. Patient taking differently: Inject 16 Units into the skin daily before breakfast. 06/14/21   Shelly Coss, MD  insulin regular (NOVOLIN R) 100 units/mL injection Inject 0-0.15 mLs (0-15 Units total) into the skin See admin instructions. Via pump.Start this medication after you follow up with your PCP Patient taking differently: Inject 0-15 Units into the skin See admin instructions. Inject 0-15 units into the skin three times a day with meals, per sliding scale 06/14/21   Shelly Coss, MD  metoCLOPramide (REGLAN) 10 MG tablet Take 1 tablet (10 mg total) by mouth every 8 (eight) hours as needed for nausea or  vomiting. Patient taking differently: Take 10 mg by mouth 3 (three) times daily before meals. 11/08/21   Hosie Poisson, MD  ondansetron (ZOFRAN) 4 MG tablet Take 4 mg by mouth every 8 (eight) hours as needed for nausea or vomiting.    [provider]  pantoprazole (PROTONIX) 40 MG tablet Take 1 tablet (40 mg total) by mouth 2 (two) times daily before a meal. Patient taking differently: Take 40 mg by mouth daily before breakfast. 11/08/21   Hosie Poisson, MD  TURMERIC PO Take 1 capsule by mouth daily with breakfast.    [provider]  insulin aspart (NOVOLOG) 100 UNIT/ML FlexPen Inject 5 Units into the skin 3 (three) times daily with meals. 02/23/18 07/05/19  Donne Hazel, MD    Physical Exam: Vitals:   12/25/21 1800 12/25/21 1822 12/25/21 1920 12/25/21 1930  BP: (!) 186/107  (!) 198/120 (!) 190/108  Pulse: (!) 118  (!) 121 (!) 120  Resp:  18 20  Temp:  98.4 F (36.9 C)    TempSrc:  Oral    SpO2: 98%  100% 100%    Constitutional: NAD, calm, does appear uncomfortable Vitals:   12/25/21 1800 12/25/21 1822 12/25/21 1920 12/25/21 1930  BP: (!) 186/107  (!) 198/120 (!) 190/108  Pulse: (!) 118  (!) 121 (!) 120  Resp:   18 20  Temp:  98.4 F (36.9 C)    TempSrc:  Oral    SpO2: 98%  100% 100%   Eyes: PERRL, lids and conjunctivae normal ENMT: Mucous membranes moist. Posterior pharynx clear of any exudate or lesions.Normal dentition.  Respiratory: clear to auscultation bilaterally, no wheezing, no crackles. Normal respiratory effort. No accessory muscle use.  Cardiovascular: Regular rate and rhythm, no murmurs / rubs / gallops. No extremity edema. 2+ pedal pulses. Abdomen: + tenderness diffuse, no masses palpated. No hepatosplenomegaly. Bowel sounds positive.  Musculoskeletal: no clubbing / cyanosis. No joint deformity upper and lower extremities. Good ROM, no contractures. Normal muscle tone.  Skin: no rashes, lesions, ulcers. No induration Neurologic: CN 2-12 grossly  intact. Sensation intact,  Strength 5/5 in all 4.  Psychiatric: Normal judgment and insight. Alert and oriented x 3. Normal mood.    Labs on Admission: I have personally reviewed following labs and imaging studies  CBC: Recent Labs  Lab 12/25/21 1333  WBC 9.3  NEUTROABS 7.5  HGB 12.4  HCT 38.4  MCV 88.3  PLT 123456*   Basic Metabolic Panel: Recent Labs  Lab 12/25/21 1333  NA 136  K 4.5  CL 100  CO2 24  GLUCOSE 407*  BUN 24*  CREATININE 1.22*  CALCIUM 9.8   GFR: CrCl cannot be calculated (Unknown ideal weight.). Liver Function Tests: Recent Labs  Lab 12/25/21 1333  AST 22  ALT 20  ALKPHOS 111  BILITOT 1.1  PROT 7.8  ALBUMIN 3.5   Recent Labs  Lab 12/25/21 1333  LIPASE 27   No results for input(s): "AMMONIA" in the last 168 hours. Coagulation Profile: No results for input(s): "INR", "PROTIME" in the last 168 hours. Cardiac Enzymes: No results for input(s): "CKTOTAL", "CKMB", "CKMBINDEX", "TROPONINI" in the last 168 hours. BNP (last 3 results) No results for input(s): "PROBNP" in the last 8760 hours. HbA1C: No results for input(s): "HGBA1C" in the last 72 hours. CBG: Recent Labs  Lab 12/25/21 1309  GLUCAP 409*   Lipid Profile: No results for input(s): "CHOL", "HDL", "LDLCALC", "TRIG", "CHOLHDL", "LDLDIRECT" in the last 72 hours. Thyroid Function Tests: No results for input(s): "TSH", "T4TOTAL", "FREET4", "T3FREE", "THYROIDAB" in the last 72 hours. Anemia Panel: No results for input(s): "VITAMINB12", "FOLATE", "FERRITIN", "TIBC", "IRON", "RETICCTPCT" in the last 72 hours. Urine analysis:    Component Value Date/Time   COLORURINE STRAW (A) 12/25/2021 1624   APPEARANCEUR CLEAR 12/25/2021 1624   LABSPEC 1.018 12/25/2021 1624   PHURINE 6.0 12/25/2021 1624   GLUCOSEU >=500 (A) 12/25/2021 1624   GLUCOSEU NEGATIVE 05/22/2020 1417   HGBUR SMALL (A) 12/25/2021 1624   BILIRUBINUR NEGATIVE 12/25/2021 1624   KETONESUR 20 (A) 12/25/2021 1624   PROTEINUR  >=300 (A) 12/25/2021 1624   UROBILINOGEN 0.2 05/22/2020 1417   NITRITE NEGATIVE 12/25/2021 1624   LEUKOCYTESUR NEGATIVE 12/25/2021 1624    Radiological Exams on Admission: No results found.  EKG: Independently reviewed. See above   Assessment/Plan  Refractory Nausea and vomiting/ Acute Exacerbation of severe Gastroparesis   Mild AKI  -in setting of gi losses  -continue with ivfs  -  hold nephrotoxic medications  DMII uncontrolled with hyperglycemia -resume insulin regimen  -hold oral hypoglycemic  - monitor fs q4h  -ISS  -noted normal gap -ketones in urine -starvation ketosis  -continue to monitor bmp q6hours x 3 ensure gap remains stable   GERD -ppi  bid   Hypertension -resume home regimen as able  -prn meds, once pain treated   Hyperthyrodism  -check tsh  -resume home regimen   Hx of Prolonged Q T on EKG -most likely med induced  - patient current EKG notes QTC 409  -daily EKG  while in house on antiemetics    DVT prophylaxis: heparin Code Status: full Family Communication: n/a Disposition Plan: patient  expected to be admitted greater than 2 midnights  Consults called: n/a Admission status: med tele    Lurline Del MD [facility]   If 7PM-7AM, please contact night-coverage [email or web address] Password [email or web address]  12/25/2021, 8:11 PM

## 2021-12-25 NOTE — ED Provider Triage Note (Signed)
Emergency Medicine Provider Triage Evaluation Note  Kelli Hudson , a 31 y.o. female  was evaluated in triage.  Pt complains of vomiting, gastroparesis, taking bentyl and zofran without relief. Denies fevers, chills. Abdominal pain, unchanged from prior episodes.  Review of Systems  Positive:  Negative:   Physical Exam  BP (!) 177/138 (BP Location: Right Arm)   Pulse (!) 108   Temp 99 F (37.2 C) (Oral)   Resp 18   SpO2 100%  Gen:   Awake, appears uncomfortable  Resp:  Normal effort  MSK:   Moves extremities without difficulty  Other:    Medical Decision Making  Medically screening exam initiated at 12:38 PM.  Appropriate orders placed.  Kelli Hudson was informed that the remainder of the evaluation will be completed by another provider, this initial triage assessment does not replace that evaluation, and the importance of remaining in the ED until their evaluation is complete.     Jeannie Fend, PA-C 12/25/21 1238

## 2021-12-25 NOTE — ED Provider Notes (Signed)
4:46 PM Patient signed out to me by previous ED physician. Pt is a 31 yo female presenting for nausea, vomiting, abdominal pain. Glucose 400 on arrival. Long time hx of gastroparesis.  Re-eval when pt gets bed Physical Exam  BP (!) 185/79 (BP Location: Left Arm)   Pulse (!) 108   Temp 98.4 F (36.9 C) (Oral)   Resp 18   SpO2 93%   Physical Exam Vitals and nursing note reviewed.  Constitutional:      General: She is not in acute distress.    Appearance: She is well-developed.  HENT:     Head: Normocephalic and atraumatic.  Eyes:     Conjunctiva/sclera: Conjunctivae normal.  Cardiovascular:     Rate and Rhythm: Normal rate and regular rhythm.     Heart sounds: No murmur heard. Pulmonary:     Effort: Pulmonary effort is normal. No respiratory distress.     Breath sounds: Normal breath sounds.  Abdominal:     Palpations: Abdomen is soft.     Tenderness: There is generalized abdominal tenderness.  Musculoskeletal:        General: No swelling.     Cervical back: Neck supple.  Skin:    General: Skin is warm and dry.     Capillary Refill: Capillary refill takes less than 2 seconds.  Neurological:     Mental Status: She is alert.  Psychiatric:        Mood and Affect: Mood normal.     Procedures  Procedures  ED Course / MDM    Medical Decision Making Risk Prescription drug management. Decision regarding hospitalization.   Patient concerned for repeat nausea and vomiting at this time.  Repeat IV fluids, Reglan, Benadryl, Toradol, morphine, and Pepcid ordered.  Recommending admission for gastroparesis at this time.  Patient agreeable to plan.  I spoke with admitting physician  agrees to accept patient.       Franne Forts, DO 12/25/21 2007

## 2021-12-25 NOTE — ED Notes (Signed)
Pt prompted to provide urine sample, but states she is unable to.

## 2021-12-25 NOTE — ED Notes (Addendum)
Pt c/o chest tightness. RN aware.

## 2021-12-26 DIAGNOSIS — R112 Nausea with vomiting, unspecified: Secondary | ICD-10-CM | POA: Diagnosis not present

## 2021-12-26 LAB — CBC
HCT: 30.9 % — ABNORMAL LOW (ref 36.0–46.0)
Hemoglobin: 10.1 g/dL — ABNORMAL LOW (ref 12.0–15.0)
MCH: 29.2 pg (ref 26.0–34.0)
MCHC: 32.7 g/dL (ref 30.0–36.0)
MCV: 89.3 fL (ref 80.0–100.0)
Platelets: 357 10*3/uL (ref 150–400)
RBC: 3.46 MIL/uL — ABNORMAL LOW (ref 3.87–5.11)
RDW: 13.4 % (ref 11.5–15.5)
WBC: 13.2 10*3/uL — ABNORMAL HIGH (ref 4.0–10.5)
nRBC: 0 % (ref 0.0–0.2)

## 2021-12-26 LAB — CBG MONITORING, ED
Glucose-Capillary: 297 mg/dL — ABNORMAL HIGH (ref 70–99)
Glucose-Capillary: 241 mg/dL — ABNORMAL HIGH (ref 70–99)
Glucose-Capillary: 250 mg/dL — ABNORMAL HIGH (ref 70–99)
Glucose-Capillary: 322 mg/dL — ABNORMAL HIGH (ref 70–99)

## 2021-12-26 LAB — COMPREHENSIVE METABOLIC PANEL
ALT: 14 U/L (ref 0–44)
AST: 15 U/L (ref 15–41)
Albumin: 2.7 g/dL — ABNORMAL LOW (ref 3.5–5.0)
Alkaline Phosphatase: 76 U/L (ref 38–126)
Anion gap: 9 (ref 5–15)
BUN: 32 mg/dL — ABNORMAL HIGH (ref 6–20)
CO2: 27 mmol/L (ref 22–32)
Calcium: 8.9 mg/dL (ref 8.9–10.3)
Chloride: 101 mmol/L (ref 98–111)
Creatinine, Ser: 2.24 mg/dL — ABNORMAL HIGH (ref 0.44–1.00)
GFR, Estimated: 30 mL/min — ABNORMAL LOW (ref >60)
Glucose, Bld: 274 mg/dL — ABNORMAL HIGH (ref 70–99)
Potassium: 4 mmol/L (ref 3.5–5.1)
Sodium: 137 mmol/L (ref 135–145)
Total Bilirubin: 0.8 mg/dL (ref 0.3–1.2)
Total Protein: 6.4 g/dL — ABNORMAL LOW (ref 6.5–8.1)

## 2021-12-26 LAB — BASIC METABOLIC PANEL
Anion gap: 12 (ref 5–15)
Anion gap: 8 (ref 5–15)
BUN: 29 mg/dL — ABNORMAL HIGH (ref 6–20)
BUN: 33 mg/dL — ABNORMAL HIGH (ref 6–20)
CO2: 23 mmol/L (ref 22–32)
CO2: 27 mmol/L (ref 22–32)
Calcium: 9.1 mg/dL (ref 8.9–10.3)
Calcium: 8.9 mg/dL (ref 8.9–10.3)
Chloride: 102 mmol/L (ref 98–111)
Chloride: 100 mmol/L (ref 98–111)
Creatinine, Ser: 1.54 mg/dL — ABNORMAL HIGH (ref 0.44–1.00)
Creatinine, Ser: 2.09 mg/dL — ABNORMAL HIGH (ref 0.44–1.00)
GFR, Estimated: 46 mL/min — ABNORMAL LOW (ref >60)
GFR, Estimated: 32 mL/min — ABNORMAL LOW (ref >60)
Glucose, Bld: 328 mg/dL — ABNORMAL HIGH (ref 70–99)
Glucose, Bld: 273 mg/dL — ABNORMAL HIGH (ref 70–99)
Potassium: 3.6 mmol/L (ref 3.5–5.1)
Potassium: 3.8 mmol/L (ref 3.5–5.1)
Sodium: 137 mmol/L (ref 135–145)
Sodium: 135 mmol/L (ref 135–145)

## 2021-12-26 LAB — GLUCOSE, CAPILLARY
Glucose-Capillary: 154 mg/dL — ABNORMAL HIGH (ref 70–99)
Glucose-Capillary: 181 mg/dL — ABNORMAL HIGH (ref 70–99)
Glucose-Capillary: 220 mg/dL — ABNORMAL HIGH (ref 70–99)

## 2021-12-26 MED ORDER — SODIUM CHLORIDE 0.45 % IV SOLN: INTRAVENOUS | Status: DC

## 2021-12-26 NOTE — Inpatient Diabetes Management (Signed)
Inpatient Diabetes Program Recommendations  AACE/ADA: New Consensus Statement on Inpatient Glycemic Control (2015)  Target Ranges:  Prepandial:   less than 140 mg/dL      Peak postprandial:   less than 180 mg/dL (1-2 hours)      Critically ill patients:  140 - 180 mg/dL   Lab Results  Component Value Date   GLUCAP 322 (H) 12/26/2021   HGBA1C 10.3 (H) 10/06/2021    Review of Glycemic Control  Diabetes history: DM1 Outpatient Diabetes medications: Levemir 16 Q24H, Novolin R 0-15 TID Current orders for Inpatient glycemic control: Levemir 16 units Q24H, Novolog 0-9 Q4H  HgbA1C - 10.3% Pt well-known to Inpatient Diabetes Team from previous admissions.  Inpatient Diabetes Program Recommendations:   Consider adding Novolog 2-3 units TID with meals  Continue to follow.  Thank you. Ailene Ards, RD, LDN, CDCES Inpatient Diabetes Coordinator [phone]

## 2021-12-26 NOTE — Progress Notes (Signed)
PROGRESS NOTE    Kelli Hudson  ZGY:[id number] DOB: 1990/09/10 DOA: 12/25/2021 PCP: Loyola Mast, MD    Brief Narrative:  31 year old with uncontrolled type 2 diabetes, severe gastroparesis and recurrent hospitalization presented with yet another episode of intractable nausea epigastric pain after eating some spicy food.  She was discharged about 2 weeks ago from the hospital with similar symptoms.  In the emergency room hemodynamically stable.  Received multiple rounds of pain medications and nausea medication with no symptom relief so admitted to the hospital.   Assessment & Plan:   Intractable nausea and vomiting and epigastric pain secondary to severe gastroparesis due to diabetes: Continue IV fluids and maintenance. Adequate pain medications and nausea medications. She tolerated clears, she wants to try full liquid diet.  Will challenge. Monitor electrolytes.  Recheck renal functions tomorrow morning. Patient is supposed to have follow-up with gastroenterology at [facility] for pacemaker evaluation.  Type 2 diabetes, uncontrolled with hyperglycemia: Resume home insulin.  Keep sliding scale insulin.  Once he is able to tolerate oral diet, will increase insulin doses.  Acute kidney injury with moderate dehydration: Maintenance fluid.  Output is adequate.  Recheck tomorrow.  Chronic medical issues including GERD: On PPI Hypertension: On Coreg Remained stable.   DVT prophylaxis: heparin injection 5,000 Units Start: 12/25/21 2200   Code Status: Full code Family Communication: None at the bedside Disposition Plan: Status is: Inpatient Remains inpatient appropriate because: Significant nausea vomiting and epigastric pain, acute renal failure     Consultants:  None  Procedures:  None  Antimicrobials:  None   Subjective: Patient seen and examined.  Still in the emergency room.  She had 1 episode of epigastric pain since morning and relieved with fentanyl.   Denies any nausea.  She tolerated clears including beef broth.  Wants to try some full liquid diet.  Objective: Vitals:   12/26/21 0700 12/26/21 0729 12/26/21 1000 12/26/21 1239  BP: 116/78 116/78 132/85 95/62  Pulse: (!) 108 (!) 107 (!) 111 94  Resp: 11 16 14 11   Temp:  (!) 97.5 F (36.4 C)  97.6 F (36.4 C)  TempSrc:  Oral  Oral  SpO2: 99% 100% 98% 98%    Intake/Output Summary (Last 24 hours) at 12/26/2021 1332 Last data filed at 12/25/2021 1830 Gross per 24 hour  Intake 1015.65 ml  Output --  Net 1015.65 ml   There were no vitals filed for this visit.  Examination:  General exam: Appears calm and comfortable  Respiratory system: Clear to auscultation. Respiratory effort normal. Cardiovascular system: S1 & S2 heard, RRR.  Gastrointestinal system: Soft.  Nontender.  Bowel sound present. Central nervous system: Alert and oriented. No focal neurological deficits. Extremities: Symmetric 5 x 5 power. Skin: No rashes, lesions or ulcers Psychiatry: Judgement and insight appear normal. Mood & affect appropriate.     Data Reviewed: I have personally reviewed following labs and imaging studies  CBC: Recent Labs  Lab 12/25/21 1333 12/25/21 2210 12/26/21 0550  WBC 9.3 11.1* 13.2*  NEUTROABS 7.5  --   --   HGB 12.4 12.7 10.1*  HCT 38.4 38.9 30.9*  MCV 88.3 89.0 89.3  PLT 428* 435* 357   Basic Metabolic Panel: Recent Labs  Lab 12/25/21 1333 12/25/21 2210 12/26/21 0230 12/26/21 0550 12/26/21 1002  NA 136 137 137 137 135  K 4.5 4.5 3.6 4.0 3.8  CL 100 99 102 101 100  CO2 24 21* 23 27 27   GLUCOSE 407* 409* 328* 274* 273*  BUN 24* 22* 29* 32* 33*  CREATININE 1.22* 1.07* 1.54* 2.24* 2.09*  CALCIUM 9.8 9.5 9.1 8.9 8.9   GFR: CrCl cannot be calculated (Unknown ideal weight.). Liver Function Tests: Recent Labs  Lab 12/25/21 1333 12/26/21 0550  AST 22 15  ALT 20 14  ALKPHOS 111 76  BILITOT 1.1 0.8  PROT 7.8 6.4*  ALBUMIN 3.5 2.7*   Recent Labs  Lab  12/25/21 1333  LIPASE 27   No results for input(s): "AMMONIA" in the last 168 hours. Coagulation Profile: No results for input(s): "INR", "PROTIME" in the last 168 hours. Cardiac Enzymes: No results for input(s): "CKTOTAL", "CKMB", "CKMBINDEX", "TROPONINI" in the last 168 hours. BNP (last 3 results) No results for input(s): "PROBNP" in the last 8760 hours. HbA1C: No results for input(s): "HGBA1C" in the last 72 hours. CBG: Recent Labs  Lab 12/25/21 2222 12/26/21 0210 12/26/21 0624 12/26/21 0734 12/26/21 1238  GLUCAP 385* 297* 241* 250* 322*   Lipid Profile: No results for input(s): "CHOL", "HDL", "LDLCALC", "TRIG", "CHOLHDL", "LDLDIRECT" in the last 72 hours. Thyroid Function Tests: Recent Labs    12/25/21 2210  TSH 1.300   Anemia Panel: No results for input(s): "VITAMINB12", "FOLATE", "FERRITIN", "TIBC", "IRON", "RETICCTPCT" in the last 72 hours. Sepsis Labs: No results for input(s): "PROCALCITON", "LATICACIDVEN" in the last 168 hours.  No results found for this or any previous visit (from the past 240 hour(s)).       Radiology Studies: No results found.      Scheduled Meds:  atorvastatin  20 mg Oral Daily   carvedilol  6.25 mg Oral BID WC   gabapentin  300 mg Oral TID   heparin  5,000 Units Subcutaneous Q8H   insulin aspart  0-9 Units Subcutaneous Q4H   insulin detemir  16 Units Subcutaneous Q24H   Continuous Infusions:  sodium chloride     famotidine (PEPCID) IV Stopped (12/26/21 1022)     LOS: 1 day    Time spent: 35 minutes    Dorcas Carrow, MD [facility] Pager [phone]

## 2021-12-27 DIAGNOSIS — R112 Nausea with vomiting, unspecified: Secondary | ICD-10-CM | POA: Diagnosis not present

## 2021-12-27 LAB — CBC WITH DIFFERENTIAL/PLATELET
Abs Immature Granulocytes: 0.02 10*3/uL (ref 0.00–0.07)
Basophils Absolute: 0 10*3/uL (ref 0.0–0.1)
Basophils Relative: 0 %
Eosinophils Absolute: 0.2 10*3/uL (ref 0.0–0.5)
Eosinophils Relative: 2 %
HCT: 30.4 % — ABNORMAL LOW (ref 36.0–46.0)
Hemoglobin: 9.8 g/dL — ABNORMAL LOW (ref 12.0–15.0)
Immature Granulocytes: 0 %
Lymphocytes Relative: 53 %
Lymphs Abs: 5.8 10*3/uL — ABNORMAL HIGH (ref 0.7–4.0)
MCH: 28.8 pg (ref 26.0–34.0)
MCHC: 32.2 g/dL (ref 30.0–36.0)
MCV: 89.4 fL (ref 80.0–100.0)
Monocytes Absolute: 0.6 10*3/uL (ref 0.1–1.0)
Monocytes Relative: 5 %
Neutro Abs: 4.5 10*3/uL (ref 1.7–7.7)
Neutrophils Relative %: 40 %
Platelets: 339 10*3/uL (ref 150–400)
RBC: 3.4 MIL/uL — ABNORMAL LOW (ref 3.87–5.11)
RDW: 13.5 % (ref 11.5–15.5)
WBC: 11.2 10*3/uL — ABNORMAL HIGH (ref 4.0–10.5)
nRBC: 0 % (ref 0.0–0.2)

## 2021-12-27 LAB — COMPREHENSIVE METABOLIC PANEL
ALT: 15 U/L (ref 0–44)
AST: 17 U/L (ref 15–41)
Albumin: 2.5 g/dL — ABNORMAL LOW (ref 3.5–5.0)
Alkaline Phosphatase: 71 U/L (ref 38–126)
Anion gap: 7 (ref 5–15)
BUN: 32 mg/dL — ABNORMAL HIGH (ref 6–20)
CO2: 24 mmol/L (ref 22–32)
Calcium: 8.9 mg/dL (ref 8.9–10.3)
Chloride: 106 mmol/L (ref 98–111)
Creatinine, Ser: 1.44 mg/dL — ABNORMAL HIGH (ref 0.44–1.00)
GFR, Estimated: 50 mL/min — ABNORMAL LOW (ref >60)
Glucose, Bld: 135 mg/dL — ABNORMAL HIGH (ref 70–99)
Potassium: 4 mmol/L (ref 3.5–5.1)
Sodium: 137 mmol/L (ref 135–145)
Total Bilirubin: 0.8 mg/dL (ref 0.3–1.2)
Total Protein: 5.8 g/dL — ABNORMAL LOW (ref 6.5–8.1)

## 2021-12-27 LAB — GLUCOSE, CAPILLARY
Glucose-Capillary: 113 mg/dL — ABNORMAL HIGH (ref 70–99)
Glucose-Capillary: 134 mg/dL — ABNORMAL HIGH (ref 70–99)
Glucose-Capillary: 147 mg/dL — ABNORMAL HIGH (ref 70–99)
Glucose-Capillary: 173 mg/dL — ABNORMAL HIGH (ref 70–99)
Glucose-Capillary: 251 mg/dL — ABNORMAL HIGH (ref 70–99)
Glucose-Capillary: 277 mg/dL — ABNORMAL HIGH (ref 70–99)

## 2021-12-27 LAB — PHOSPHORUS: Phosphorus: 4.4 mg/dL (ref 2.5–4.6)

## 2021-12-27 LAB — MAGNESIUM: Magnesium: 2 mg/dL (ref 1.7–2.4)

## 2021-12-27 MED ORDER — FAMOTIDINE 20 MG PO TABS
20.0000 mg | ORAL_TABLET | Freq: Two times a day (BID) | ORAL | Status: DC
  Administered 2021-12-27 – 2021-12-30 (×7): 20 mg via ORAL
  Filled 2021-12-27 (×7): qty 1

## 2021-12-27 MED ORDER — SODIUM CHLORIDE 0.9 % IV BOLUS
500.0000 mL | Freq: Once | INTRAVENOUS | Status: AC
  Administered 2021-12-27: 500 mL via INTRAVENOUS

## 2021-12-27 MED ORDER — MORPHINE SULFATE (PF) 2 MG/ML IV SOLN
2.0000 mg | INTRAVENOUS | Status: DC | PRN
  Administered 2021-12-27 – 2021-12-28 (×6): 2 mg via INTRAVENOUS
  Filled 2021-12-27 (×6): qty 1

## 2021-12-27 MED ORDER — METOCLOPRAMIDE HCL 5 MG/ML IJ SOLN
10.0000 mg | Freq: Four times a day (QID) | INTRAMUSCULAR | Status: DC
  Administered 2021-12-27 – 2021-12-30 (×12): 10 mg via INTRAVENOUS
  Filled 2021-12-27 (×12): qty 2

## 2021-12-27 MED ORDER — PROCHLORPERAZINE EDISYLATE 10 MG/2ML IJ SOLN
10.0000 mg | Freq: Four times a day (QID) | INTRAMUSCULAR | Status: DC | PRN
  Administered 2021-12-27: 10 mg via INTRAVENOUS
  Filled 2021-12-27 (×2): qty 2

## 2021-12-27 NOTE — Progress Notes (Signed)
       CROSS COVER NOTE  NAME: Kelli Hudson MRN: [id number] DOB : April 14, 1990    Date of Service   12/27/2021   HPI/Events of Note   Notified by bedside RN of asymptomatic hypotensive episode, 88/57.  Patient was admitted due to intractable nausea and vomiting and epigastric pain secondary to severe gastroparesis due to diabetes.    Patient is currently receiving IVF for hydration and morphine for moderate to severe pain.  Nausea being controlled with Reglan and Compazine.  The patient's most recent BPs noted to have systolic ~90s.  Soft BP likely due to morphine use.   2300- BP improved  Interventions/ Plan   500 cc NS bolus ordered.  Recommend Rn use Bentyl as well to help alleviate abd discomfort.  Continue monitoring for hypotensive episodes.      Anthoney Harada, DNP, ACNPC- AG [facility]

## 2021-12-27 NOTE — Progress Notes (Signed)
2015 on call notified of hypotension, stated to monitor pt.   2145 on call notified of bp lower than previously reported. NACL bolus ordered and administered. BP increased to 95/58 post bolus. Pt denies any dizziness, appears to be resting.

## 2021-12-27 NOTE — Progress Notes (Signed)
PROGRESS NOTE    Kelli Hudson  UJW:[id number] DOB: 13-Jan-1991 DOA: 12/25/2021 PCP: Loyola Mast, MD    Brief Narrative:  31 year old with uncontrolled type 2 diabetes, severe gastroparesis and recurrent hospitalization presented with yet another episode of intractable nausea epigastric pain after eating some spicy food.  She was discharged about 2 weeks ago from the hospital with similar symptoms.  In the emergency room hemodynamically stable.  Received multiple rounds of pain medications and nausea medication with no symptom relief so admitted to the hospital.   Assessment & Plan:   Intractable nausea and vomiting and epigastric pain secondary to severe gastroparesis due to diabetes: Continue IV fluids and maintenance. Adequate pain medications and nausea medications. Patient without adequate symptom control, will start on scheduled metoclopramide and additional Compazine. Not tolerating full liquid diet.  Go back to clears and sips of water. Monitor electrolytes.  Recheck renal functions tomorrow morning. Patient is supposed to have follow-up with gastroenterology at [facility] for pacemaker evaluation.  Type 2 diabetes, uncontrolled with hyperglycemia: Resume home insulin.  Keep sliding scale insulin.  Once able to tolerate oral diet, will increase insulin doses.  Acute kidney injury with moderate dehydration: Maintenance fluid.  Output is adequate.  Recheck tomorrow.  Chronic medical issues including GERD: On PPI Hypertension: On Coreg Remained stable.   DVT prophylaxis: heparin injection 5,000 Units Start: 12/25/21 2200   Code Status: Full code Family Communication: None at the bedside Disposition Plan: Status is: Inpatient Remains inpatient appropriate because: Significant nausea vomiting and epigastric pain, acute renal failure     Consultants:  None  Procedures:  None  Antimicrobials:  None   Subjective: Patient seen and examined.  Actively  vomiting large emesis.  Could not try any meal.  Epigastric pain persist with vomiting.   Objective: Vitals:   12/26/21 1623 12/26/21 2052 12/27/21 0007 12/27/21 0423  BP: 109/74 119/75 117/74 115/80  Pulse: 93 97 95 89  Resp: 18 17 17 17   Temp: 98.2 F (36.8 C) 98.1 F (36.7 C) 98.7 F (37.1 C) 97.6 F (36.4 C)  TempSrc: Oral Oral Oral Oral  SpO2: 100% 100% 100% 99%    Intake/Output Summary (Last 24 hours) at 12/27/2021 1115 Last data filed at 12/27/2021 1008 Gross per 24 hour  Intake 3227.32 ml  Output --  Net 3227.32 ml   There were no vitals filed for this visit.  Examination:  General exam: Appears anxious.  Moderately distressed and vomiting. Respiratory system: Clear to auscultation. Respiratory effort normal. Cardiovascular system: S1 & S2 heard, RRR.  Gastrointestinal system: Soft.  Mild epigastric tenderness.  No rigidity or guarding.  Bowel sound present. Central nervous system: Alert and oriented. No focal neurological deficits. Extremities: Symmetric 5 x 5 power. Skin: No rashes, lesions or ulcers Psychiatry: Flat affect.    Data Reviewed: I have personally reviewed following labs and imaging studies  CBC: Recent Labs  Lab 12/25/21 1333 12/25/21 2210 12/26/21 0550 12/27/21 0515  WBC 9.3 11.1* 13.2* 11.2*  NEUTROABS 7.5  --   --  4.5  HGB 12.4 12.7 10.1* 9.8*  HCT 38.4 38.9 30.9* 30.4*  MCV 88.3 89.0 89.3 89.4  PLT 428* 435* 357 339   Basic Metabolic Panel: Recent Labs  Lab 12/25/21 2210 12/26/21 0230 12/26/21 0550 12/26/21 1002 12/27/21 0515  NA 137 137 137 135 137  K 4.5 3.6 4.0 3.8 4.0  CL 99 102 101 100 106  CO2 21* 23 27 27 24   GLUCOSE 409* 328* 274*  273* 135*  BUN 22* 29* 32* 33* 32*  CREATININE 1.07* 1.54* 2.24* 2.09* 1.44*  CALCIUM 9.5 9.1 8.9 8.9 8.9  MG  --   --   --   --  2.0  PHOS  --   --   --   --  4.4   GFR: CrCl cannot be calculated (Unknown ideal weight.). Liver Function Tests: Recent Labs  Lab 12/25/21 1333  12/26/21 0550 12/27/21 0515  AST 22 15 17   ALT 20 14 15   ALKPHOS 111 76 71  BILITOT 1.1 0.8 0.8  PROT 7.8 6.4* 5.8*  ALBUMIN 3.5 2.7* 2.5*   Recent Labs  Lab 12/25/21 1333  LIPASE 27   No results for input(s): "AMMONIA" in the last 168 hours. Coagulation Profile: No results for input(s): "INR", "PROTIME" in the last 168 hours. Cardiac Enzymes: No results for input(s): "CKTOTAL", "CKMB", "CKMBINDEX", "TROPONINI" in the last 168 hours. BNP (last 3 results) No results for input(s): "PROBNP" in the last 8760 hours. HbA1C: No results for input(s): "HGBA1C" in the last 72 hours. CBG: Recent Labs  Lab 12/26/21 1645 12/26/21 2050 12/26/21 2343 12/27/21 0357 12/27/21 0745  GLUCAP 154* 220* 181* 147* 113*   Lipid Profile: No results for input(s): "CHOL", "HDL", "LDLCALC", "TRIG", "CHOLHDL", "LDLDIRECT" in the last 72 hours. Thyroid Function Tests: Recent Labs    12/25/21 2210  TSH 1.300   Anemia Panel: No results for input(s): "VITAMINB12", "FOLATE", "FERRITIN", "TIBC", "IRON", "RETICCTPCT" in the last 72 hours. Sepsis Labs: No results for input(s): "PROCALCITON", "LATICACIDVEN" in the last 168 hours.  No results found for this or any previous visit (from the past 240 hour(s)).       Radiology Studies: No results found.      Scheduled Meds:  atorvastatin  20 mg Oral Daily   carvedilol  6.25 mg Oral BID WC   famotidine  20 mg Oral BID   gabapentin  300 mg Oral TID   heparin  5,000 Units Subcutaneous Q8H   insulin aspart  0-9 Units Subcutaneous Q4H   insulin detemir  16 Units Subcutaneous Q24H   metoCLOPramide (REGLAN) injection  10 mg Intravenous Q6H   Continuous Infusions:  sodium chloride 100 mL/hr at 12/27/21 1049     LOS: 2 days    Time spent: 35 minutes    Barb Merino, MD [facility] Pager [phone]

## 2021-12-27 NOTE — TOC Progression Note (Signed)
Transition of Care [facility]) - Progression Note    Patient Details  Name: Kelli Hudson MRN: [id number] Date of Birth: 12-07-1990  Transition of Care [facility]) CM/SW Contact  Beckie Busing, RN Phone Number:[phone]  12/27/2021, 2:55 PM  Clinical Narrative:     Transition of Care (TOC) Screening Note   Patient Details  Name: Kelli Hudson Date of Birth: 08-03-1990   Transition of Care [facility]) CM/SW Contact:    Beckie Busing, RN Phone Number: 12/27/2021, 2:55 PM    Transition of Care Department [facility]) has reviewed patient and no TOC needs have been identified at this time. We will continue to monitor patient advancement through interdisciplinary progression rounds. If new patient transition needs arise, please place a TOC consult.          Expected Discharge Plan and Services                                                 Social Determinants of Health (SDOH) Interventions    Readmission Risk Interventions    12/12/2021    8:59 AM 12/04/2021    9:52 AM 11/06/2021   12:31 PM  Readmission Risk Prevention Plan  Transportation Screening Complete Complete Complete  Medication Review [employer]) Complete Complete Complete  PCP or Specialist appointment within 3-5 days of discharge Complete Complete Complete  HRI or Home Care Consult Complete Complete Complete  SW Recovery Care/Counseling Consult Complete Complete Complete  Palliative Care Screening Not Applicable Not Applicable Not Applicable  Skilled Nursing Facility Not Applicable Not Applicable Not Applicable

## 2021-12-28 DIAGNOSIS — R112 Nausea with vomiting, unspecified: Secondary | ICD-10-CM | POA: Diagnosis not present

## 2021-12-28 LAB — GLUCOSE, CAPILLARY
Glucose-Capillary: 114 mg/dL — ABNORMAL HIGH (ref 70–99)
Glucose-Capillary: 135 mg/dL — ABNORMAL HIGH (ref 70–99)
Glucose-Capillary: 145 mg/dL — ABNORMAL HIGH (ref 70–99)
Glucose-Capillary: 169 mg/dL — ABNORMAL HIGH (ref 70–99)
Glucose-Capillary: 206 mg/dL — ABNORMAL HIGH (ref 70–99)
Glucose-Capillary: 256 mg/dL — ABNORMAL HIGH (ref 70–99)

## 2021-12-28 MED ORDER — MORPHINE SULFATE (PF) 2 MG/ML IV SOLN
2.0000 mg | Freq: Four times a day (QID) | INTRAVENOUS | Status: DC | PRN
  Administered 2021-12-28 – 2021-12-29 (×3): 2 mg via INTRAVENOUS
  Filled 2021-12-28 (×4): qty 1

## 2021-12-28 MED ORDER — FENTANYL CITRATE PF 50 MCG/ML IJ SOSY
12.5000 ug | PREFILLED_SYRINGE | Freq: Once | INTRAMUSCULAR | Status: AC
  Administered 2021-12-28: 12.5 ug via INTRAVENOUS
  Filled 2021-12-28: qty 1

## 2021-12-28 NOTE — Progress Notes (Signed)
PROGRESS NOTE    Kelli Hudson  OVZ:[id number] DOB: [birth year] DOA: 12/25/2021 PCP: Loyola Mast, MD    Brief Narrative:  31 year old with uncontrolled type 2 diabetes, severe gastroparesis and recurrent hospitalization presented with yet another episode of intractable nausea epigastric pain after eating some spicy food.  She was discharged about 2 weeks ago from the hospital with similar symptoms.  In the emergency room hemodynamically stable.  Received multiple rounds of pain medications and nausea medication with no symptom relief so admitted to the hospital. Patient exhibits clear signs of opiate dependence and narcotic seeking behavior.  She wants to try new narcotics every day from fentanyl-morphine-Dilaudid.   Assessment & Plan:   Intractable nausea and vomiting and epigastric pain secondary to severe gastroparesis due to diabetes: Continue IV fluids and maintenance. Adequate nausea medications.  Exhibiting narcotic seeking behavior, will taper off IV morphine. Nausea controlled on scheduled doses of metoclopramide and additional Compazine as needed. Currently on clears.  She likely goes to vending machine and gets drinks. Monitor electrolytes.  Recheck renal functions tomorrow morning. Patient is supposed to have follow-up with gastroenterology at [facility] for pacemaker evaluation.  Type 2 diabetes, uncontrolled with hyperglycemia: Resumed home insulin.  Keep sliding scale insulin.  Once able to tolerate oral diet, will increase insulin doses.  Acute kidney injury with moderate dehydration: Maintenance fluid.  Output is adequate.  Recheck tomorrow.  Chronic medical issues including GERD: On PPI Hypertension: On Coreg Remained stable.   DVT prophylaxis: heparin injection 5,000 Units Start: 12/25/21 2200   Code Status: Full code Family Communication: None at the bedside Disposition Plan: Status is: Inpatient Remains inpatient appropriate because:  Significant nausea vomiting and epigastric pain, acute renal failure     Consultants:  None  Procedures:  None  Antimicrobials:  None   Subjective:  Patient seen and examined.  Walking in the hallway.  She tells me that her nausea is controlled on around-the-clock metoclopramide. -Nursing suspected that patient goes to vending machine and gets soda for herself and comes to the room and vomits. -We discussed about narcotics making her gastroparesis symptoms worse and cutting down on the doses gradually.  When I talked to her about decreasing dose of morphine, she requested me to change it to Dilaudid.   Objective: Vitals:   12/27/21 2011 12/27/21 2144 12/27/21 2255 12/28/21 0418  BP: (!) 97/59 (!) 88/57 (!) 95/58 128/79  Pulse: 98  96 (!) 110  Resp: 16  16 18   Temp: 99.1 F (37.3 C)  98.9 F (37.2 C) 98.9 F (37.2 C)  TempSrc: Oral   Oral  SpO2: 100%  97% 98%  Weight:    82.5 kg    Intake/Output Summary (Last 24 hours) at 12/28/2021 1153 Last data filed at 12/28/2021 0400 Gross per 24 hour  Intake 2042.91 ml  Output --  Net 2042.91 ml    Filed Weights   12/28/21 0418  Weight: 82.5 kg    Examination:  General exam: Appears anxious.  Walking in the hallway. Respiratory system: Clear to auscultation. Respiratory effort normal. Cardiovascular system: S1 & S2 heard, RRR.  Gastrointestinal system: Soft.  Mild epigastric tenderness.  No rigidity or guarding.  Bowel sound present. Central nervous system: Alert and oriented. No focal neurological deficits. Extremities: Symmetric 5 x 5 power. Skin: No rashes, lesions or ulcers Psychiatry: Flat affect.    Data Reviewed: I have personally reviewed following labs and imaging studies  CBC: Recent Labs  Lab 12/25/21 1333 12/25/21 2210  12/26/21 0550 12/27/21 0515  WBC 9.3 11.1* 13.2* 11.2*  NEUTROABS 7.5  --   --  4.5  HGB 12.4 12.7 10.1* 9.8*  HCT 38.4 38.9 30.9* 30.4*  MCV 88.3 89.0 89.3 89.4  PLT 428*  435* 357 339    Basic Metabolic Panel: Recent Labs  Lab 12/25/21 2210 12/26/21 0230 12/26/21 0550 12/26/21 1002 12/27/21 0515  NA 137 137 137 135 137  K 4.5 3.6 4.0 3.8 4.0  CL 99 102 101 100 106  CO2 21* 23 27 27 24   GLUCOSE 409* 328* 274* 273* 135*  BUN 22* 29* 32* 33* 32*  CREATININE 1.07* 1.54* 2.24* 2.09* 1.44*  CALCIUM 9.5 9.1 8.9 8.9 8.9  MG  --   --   --   --  2.0  PHOS  --   --   --   --  4.4    GFR: Estimated Creatinine Clearance: 65.6 mL/min (A) (by C-G formula based on SCr of 1.44 mg/dL (H)). Liver Function Tests: Recent Labs  Lab 12/25/21 1333 12/26/21 0550 12/27/21 0515  AST 22 15 17   ALT 20 14 15   ALKPHOS 111 76 71  BILITOT 1.1 0.8 0.8  PROT 7.8 6.4* 5.8*  ALBUMIN 3.5 2.7* 2.5*    Recent Labs  Lab 12/25/21 1333  LIPASE 27    No results for input(s): "AMMONIA" in the last 168 hours. Coagulation Profile: No results for input(s): "INR", "PROTIME" in the last 168 hours. Cardiac Enzymes: No results for input(s): "CKTOTAL", "CKMB", "CKMBINDEX", "TROPONINI" in the last 168 hours. BNP (last 3 results) No results for input(s): "PROBNP" in the last 8760 hours. HbA1C: No results for input(s): "HGBA1C" in the last 72 hours. CBG: Recent Labs  Lab 12/27/21 1613 12/27/21 2009 12/27/21 2348 12/28/21 0420 12/28/21 0728  GLUCAP 251* 173* 134* 114* 206*    Lipid Profile: No results for input(s): "CHOL", "HDL", "LDLCALC", "TRIG", "CHOLHDL", "LDLDIRECT" in the last 72 hours. Thyroid Function Tests: Recent Labs    12/25/21 2210  TSH 1.300    Anemia Panel: No results for input(s): "VITAMINB12", "FOLATE", "FERRITIN", "TIBC", "IRON", "RETICCTPCT" in the last 72 hours. Sepsis Labs: No results for input(s): "PROCALCITON", "LATICACIDVEN" in the last 168 hours.  No results found for this or any previous visit (from the past 240 hour(s)).       Radiology Studies: No results found.      Scheduled Meds:  atorvastatin  20 mg Oral Daily    carvedilol  6.25 mg Oral BID WC   famotidine  20 mg Oral BID   gabapentin  300 mg Oral TID   heparin  5,000 Units Subcutaneous Q8H   insulin aspart  0-9 Units Subcutaneous Q4H   insulin detemir  16 Units Subcutaneous Q24H   metoCLOPramide (REGLAN) injection  10 mg Intravenous Q6H   Continuous Infusions:  sodium chloride 100 mL/hr at 12/27/21 2023     LOS: 3 days    Time spent: 35 minutes    13/08/23, MD [facility] Pager [phone]

## 2021-12-29 DIAGNOSIS — R112 Nausea with vomiting, unspecified: Secondary | ICD-10-CM | POA: Diagnosis not present

## 2021-12-29 LAB — COMPREHENSIVE METABOLIC PANEL
ALT: 15 U/L (ref 0–44)
AST: 16 U/L (ref 15–41)
Albumin: 2.8 g/dL — ABNORMAL LOW (ref 3.5–5.0)
Alkaline Phosphatase: 73 U/L (ref 38–126)
Anion gap: 7 (ref 5–15)
BUN: 18 mg/dL (ref 6–20)
CO2: 23 mmol/L (ref 22–32)
Calcium: 9.1 mg/dL (ref 8.9–10.3)
Chloride: 106 mmol/L (ref 98–111)
Creatinine, Ser: 1.24 mg/dL — ABNORMAL HIGH (ref 0.44–1.00)
GFR, Estimated: >60 mL/min (ref >60)
Glucose, Bld: 144 mg/dL — ABNORMAL HIGH (ref 70–99)
Potassium: 4.5 mmol/L (ref 3.5–5.1)
Sodium: 136 mmol/L (ref 135–145)
Total Bilirubin: 0.7 mg/dL (ref 0.3–1.2)
Total Protein: 6.4 g/dL — ABNORMAL LOW (ref 6.5–8.1)

## 2021-12-29 LAB — GLUCOSE, CAPILLARY
Glucose-Capillary: 107 mg/dL — ABNORMAL HIGH (ref 70–99)
Glucose-Capillary: 139 mg/dL — ABNORMAL HIGH (ref 70–99)
Glucose-Capillary: 115 mg/dL — ABNORMAL HIGH (ref 70–99)
Glucose-Capillary: 230 mg/dL — ABNORMAL HIGH (ref 70–99)
Glucose-Capillary: 196 mg/dL — ABNORMAL HIGH (ref 70–99)
Glucose-Capillary: 162 mg/dL — ABNORMAL HIGH (ref 70–99)

## 2021-12-29 LAB — CBC WITH DIFFERENTIAL/PLATELET
Abs Immature Granulocytes: 0.01 10*3/uL (ref 0.00–0.07)
Basophils Absolute: 0 10*3/uL (ref 0.0–0.1)
Basophils Relative: 1 %
Eosinophils Absolute: 0.1 10*3/uL (ref 0.0–0.5)
Eosinophils Relative: 2 %
HCT: 31.5 % — ABNORMAL LOW (ref 36.0–46.0)
Hemoglobin: 9.9 g/dL — ABNORMAL LOW (ref 12.0–15.0)
Immature Granulocytes: 0 %
Lymphocytes Relative: 54 %
Lymphs Abs: 4.6 10*3/uL — ABNORMAL HIGH (ref 0.7–4.0)
MCH: 28.5 pg (ref 26.0–34.0)
MCHC: 31.4 g/dL (ref 30.0–36.0)
MCV: 90.8 fL (ref 80.0–100.0)
Monocytes Absolute: 0.6 10*3/uL (ref 0.1–1.0)
Monocytes Relative: 7 %
Neutro Abs: 3 10*3/uL (ref 1.7–7.7)
Neutrophils Relative %: 36 %
Platelets: 382 10*3/uL (ref 150–400)
RBC: 3.47 MIL/uL — ABNORMAL LOW (ref 3.87–5.11)
RDW: 13.5 % (ref 11.5–15.5)
WBC: 8.4 10*3/uL (ref 4.0–10.5)
nRBC: 0 % (ref 0.0–0.2)

## 2021-12-29 LAB — MAGNESIUM: Magnesium: 1.9 mg/dL (ref 1.7–2.4)

## 2021-12-29 LAB — PHOSPHORUS: Phosphorus: 4.5 mg/dL (ref 2.5–4.6)

## 2021-12-29 MED ORDER — MORPHINE SULFATE (PF) 2 MG/ML IV SOLN
2.0000 mg | Freq: Two times a day (BID) | INTRAVENOUS | Status: DC | PRN
  Administered 2021-12-29: 2 mg via INTRAVENOUS
  Filled 2021-12-29: qty 1

## 2021-12-29 NOTE — Progress Notes (Signed)
PROGRESS NOTE    Kelli Hudson  WPY:[id number] DOB: 1990/06/29 DOA: 12/25/2021 PCP: Loyola Mast, MD    Brief Narrative:  31 year old with uncontrolled type 2 diabetes, severe gastroparesis and recurrent hospitalization presented with yet another episode of intractable nausea epigastric pain after eating some spicy food.  She was discharged about 2 weeks ago from the hospital with similar symptoms.  In the emergency room hemodynamically stable.  Received multiple rounds of pain medications and nausea medication with no symptom relief so admitted to the hospital. Patient exhibits clear signs of opiate dependence and narcotic seeking behavior.  She wants to try new narcotics every day from fentanyl-morphine-Dilaudid.   Assessment & Plan:   Intractable nausea and vomiting and epigastric pain secondary to severe gastroparesis due to diabetes: Continue IV fluids and maintenance. Exhibiting narcotic seeking behavior, will taper off IV morphine and stop. Nausea controlled on scheduled doses of metoclopramide and additional Compazine as needed. Currently on clears.  She would like to try soft meal today, will challenge diet. Monitor electrolytes.  Patient is supposed to have follow-up with gastroenterology at [facility] for pacemaker evaluation.  Type 2 diabetes, uncontrolled with hyperglycemia: Resumed home insulin.  Stable.   Acute kidney injury with moderate dehydration: Maintenance fluid.  Output is adequate.   Chronic medical issues including GERD: On PPI Hypertension: On Coreg Remains stable.   DVT prophylaxis: heparin injection 5,000 Units Start: 12/25/21 2200   Code Status: Full code Family Communication: None at the bedside Disposition Plan: Status is: Inpatient Remains inpatient appropriate because: Significant nausea vomiting and epigastric pain,      Consultants:  None  Procedures:  None  Antimicrobials:  None   Subjective:  Patient seen and  examined.  She tells me her pain is well controlled today.  She wants to try some solid food with metoclopramide around-the-clock and wants to see if that she can eat with taking medications.   Objective: Vitals:   12/28/21 0418 12/28/21 1446 12/28/21 2200 12/29/21 0409  BP: 128/79 110/73 130/87 101/66  Pulse: (!) 110 99 98 90  Resp: 18 16 18    Temp: 98.9 F (37.2 C) 98.8 F (37.1 C) 98.2 F (36.8 C) 98.1 F (36.7 C)  TempSrc: Oral Oral Oral Oral  SpO2: 98% 98% 100% 98%  Weight: 82.5 kg       Intake/Output Summary (Last 24 hours) at 12/29/2021 1330 Last data filed at 12/29/2021 1327 Gross per 24 hour  Intake 2640 ml  Output --  Net 2640 ml   Filed Weights   12/28/21 0418  Weight: 82.5 kg    Examination:  General exam: Appears comfortable.  Sitting at the edge of the bed.   Respiratory system: Clear to auscultation. Respiratory effort normal. Cardiovascular system: S1 & S2 heard, RRR.  Gastrointestinal system: Soft.  Mild epigastric tenderness.  No rigidity or guarding.  Bowel sound present. Central nervous system: Alert and oriented. No focal neurological deficits. Extremities: Symmetric 5 x 5 power. Skin: No rashes, lesions or ulcers Psychiatry: Flat affect.  Looks comfortable.    Data Reviewed: I have personally reviewed following labs and imaging studies  CBC: Recent Labs  Lab 12/25/21 1333 12/25/21 2210 12/26/21 0550 12/27/21 0515 12/29/21 0644  WBC 9.3 11.1* 13.2* 11.2* 8.4  NEUTROABS 7.5  --   --  4.5 3.0  HGB 12.4 12.7 10.1* 9.8* 9.9*  HCT 38.4 38.9 30.9* 30.4* 31.5*  MCV 88.3 89.0 89.3 89.4 90.8  PLT 428* 435* 357 339 382  Basic Metabolic Panel: Recent Labs  Lab 12/26/21 0230 12/26/21 0550 12/26/21 1002 12/27/21 0515 12/29/21 0644  NA 137 137 135 137 136  K 3.6 4.0 3.8 4.0 4.5  CL 102 101 100 106 106  CO2 23 27 27 24 23   GLUCOSE 328* 274* 273* 135* 144*  BUN 29* 32* 33* 32* 18  CREATININE 1.54* 2.24* 2.09* 1.44* 1.24*  CALCIUM 9.1  8.9 8.9 8.9 9.1  MG  --   --   --  2.0 1.9  PHOS  --   --   --  4.4 4.5   GFR: Estimated Creatinine Clearance: 76.1 mL/min (A) (by C-G formula based on SCr of 1.24 mg/dL (H)). Liver Function Tests: Recent Labs  Lab 12/25/21 1333 12/26/21 0550 12/27/21 0515 12/29/21 0644  AST 22 15 17 16   ALT 20 14 15 15   ALKPHOS 111 76 71 73  BILITOT 1.1 0.8 0.8 0.7  PROT 7.8 6.4* 5.8* 6.4*  ALBUMIN 3.5 2.7* 2.5* 2.8*   Recent Labs  Lab 12/25/21 1333  LIPASE 27   No results for input(s): "AMMONIA" in the last 168 hours. Coagulation Profile: No results for input(s): "INR", "PROTIME" in the last 168 hours. Cardiac Enzymes: No results for input(s): "CKTOTAL", "CKMB", "CKMBINDEX", "TROPONINI" in the last 168 hours. BNP (last 3 results) No results for input(s): "PROBNP" in the last 8760 hours. HbA1C: No results for input(s): "HGBA1C" in the last 72 hours. CBG: Recent Labs  Lab 12/28/21 1941 12/28/21 2339 12/29/21 0410 12/29/21 0725 12/29/21 1221  GLUCAP 169* 145* 107* 139* 115*   Lipid Profile: No results for input(s): "CHOL", "HDL", "LDLCALC", "TRIG", "CHOLHDL", "LDLDIRECT" in the last 72 hours. Thyroid Function Tests: No results for input(s): "TSH", "T4TOTAL", "FREET4", "T3FREE", "THYROIDAB" in the last 72 hours.  Anemia Panel: No results for input(s): "VITAMINB12", "FOLATE", "FERRITIN", "TIBC", "IRON", "RETICCTPCT" in the last 72 hours. Sepsis Labs: No results for input(s): "PROCALCITON", "LATICACIDVEN" in the last 168 hours.  No results found for this or any previous visit (from the past 240 hour(s)).       Radiology Studies: No results found.      Scheduled Meds:  atorvastatin  20 mg Oral Daily   carvedilol  6.25 mg Oral BID WC   famotidine  20 mg Oral BID   gabapentin  300 mg Oral TID   heparin  5,000 Units Subcutaneous Q8H   insulin aspart  0-9 Units Subcutaneous Q4H   insulin detemir  16 Units Subcutaneous Q24H   metoCLOPramide (REGLAN) injection  10 mg  Intravenous Q6H   Continuous Infusions:  sodium chloride 100 mL/hr at 12/29/21 0829     LOS: 4 days    Time spent: 35 minutes    13/12/23, MD [facility] Pager [phone]

## 2021-12-30 ENCOUNTER — Encounter: Payer: Self-pay | Admitting: Internal Medicine

## 2021-12-30 ENCOUNTER — Telehealth: Payer: Self-pay

## 2021-12-30 DIAGNOSIS — R112 Nausea with vomiting, unspecified: Secondary | ICD-10-CM | POA: Diagnosis not present

## 2021-12-30 LAB — GLUCOSE, CAPILLARY
Glucose-Capillary: 144 mg/dL — ABNORMAL HIGH (ref 70–99)
Glucose-Capillary: 144 mg/dL — ABNORMAL HIGH (ref 70–99)

## 2021-12-30 MED ORDER — GABAPENTIN 300 MG PO CAPS: 300.0000 mg | ORAL_CAPSULE | Freq: Three times a day (TID) | ORAL | 2 refills | Status: DC

## 2021-12-30 MED ORDER — CARVEDILOL 12.5 MG PO TABS: 6.2500 mg | ORAL_TABLET | Freq: Two times a day (BID) | ORAL | 2 refills | Status: DC

## 2021-12-30 NOTE — Telephone Encounter (Signed)
Transition Care Management Unsuccessful Follow-up Telephone Call  Date of discharge and from where:  12/30/21 [facility]. Dx: gastroparesis  Attempts:  1st Attempt  Reason for unsuccessful TCM follow-up call:  Voice mail full

## 2021-12-30 NOTE — Discharge Summary (Signed)
Physician Discharge Summary  Kelli Hudson UXL:[id number] DOB: May 17, 1990 DOA: 12/25/2021  PCP: Kelli Mast, MD  Admit date: 12/25/2021 Discharge date: 12/30/2021  Admitted From: Home Disposition: Home  Recommendations for Outpatient Follow-up:  Follow up with PCP in 1-2 weeks Please obtain BMP/CBC in one week Follow-up with gastroenterology as scheduled  Home Health: N/A Equipment/Devices: N/A  Discharge Condition: Stable CODE STATUS: Full code Diet recommendation: Gastroparesis diet, small frequent meals, low-carb  Discharge summary:  30 year old with uncontrolled type 2 diabetes, severe gastroparesis and recurrent hospitalization who was recently discharged home and came back with epigastric pain, persistent nausea vomiting.  She has been hemodynamically stable in the emergency room.  Patient did exhibit narcotic seeking behavior in the hospital.  She was treated with initially n.p.o., IV fluids, IV nausea medications around-the-clock and also received short course of IV narcotics.  Gradually symptoms improved.  She is currently able to eat regular diet with metoclopramide before meals.  Electrolytes are adequate.  Blood sugars are controlled.  She will be going home today with as needed Reglan.  She has outpatient GI follow-up.   Discharge Diagnoses:  Principal Problem:   Refractory nausea and vomiting    Discharge Instructions  Discharge Instructions     Diet - low sodium heart healthy   Complete by: As directed    Diet Carb Modified   Complete by: As directed    Increase activity slowly   Complete by: As directed       Allergies as of 12/30/2021   No Known Allergies      Medication List     TAKE these medications    atorvastatin 20 MG tablet Commonly known as: LIPITOR Take 1 tablet (20 mg total) by mouth daily.   carvedilol 12.5 MG tablet Commonly known as: COREG Take 1 tablet (12.5 mg total) by mouth 2 (two) times daily with a meal.    Dexcom G6 Sensor Misc Inject 1 Device into the skin See admin instructions. Place 1 sensor into the skin every 10 days after removing the former one   Dexcom G6 Transmitter Misc Change every 90 days   dicyclomine 20 MG tablet Commonly known as: BENTYL Take 1 tablet (20 mg total) by mouth 3 (three) times daily as needed for spasms.   gabapentin 300 MG capsule Commonly known as: NEURONTIN Take 1 capsule (300 mg total) by mouth 3 (three) times daily.   hydrOXYzine 25 MG capsule Commonly known as: VISTARIL Take 1 capsule (25 mg total) by mouth every 8 (eight) hours as needed. What changed: reasons to take this   insulin regular 100 units/mL injection Commonly known as: NovoLIN R Inject 0-0.15 mLs (0-15 Units total) into the skin See admin instructions. Via pump.Start this medication after you follow up with your PCP What changed: additional instructions   Levemir FlexTouch 100 UNIT/ML FlexPen Generic drug: insulin detemir Inject 16 Units into the skin daily. What changed: when to take this   metoCLOPramide 10 MG tablet Commonly known as: REGLAN Take 1 tablet (10 mg total) by mouth every 8 (eight) hours as needed for nausea or vomiting. What changed: when to take this   ondansetron 4 MG tablet Commonly known as: ZOFRAN Take 4 mg by mouth every 8 (eight) hours as needed for nausea or vomiting.   pantoprazole 40 MG tablet Commonly known as: PROTONIX Take 1 tablet (40 mg total) by mouth 2 (two) times daily before a meal.        No Known Allergies  Consultations: None   Procedures/Studies: No results found. (Echo, Carotid, EGD, Colonoscopy, ERCP)    Subjective: Seen and examined in the morning rounds.  She wants to go home.  Denies any pain.  She ate regular diet.   Discharge Exam: Vitals:   12/29/21 2005 12/30/21 0342  BP: 108/67 111/72  Pulse: 99 89  Resp: 18 18  Temp: 99 F (37.2 C) 98 F (36.7 C)  SpO2: 98% 99%   Vitals:   12/29/21 0409 12/29/21  1437 12/29/21 2005 12/30/21 0342  BP: 101/66 112/87 108/67 111/72  Pulse: 90 96 99 89  Resp:  16 18 18   Temp: 98.1 F (36.7 C) 98.6 F (37 C) 99 F (37.2 C) 98 F (36.7 C)  TempSrc: Oral Oral Oral Oral  SpO2: 98% 98% 98% 99%  Weight:        General: Pt is alert, awake, not in acute distress, flat affect. Cardiovascular: RRR, S1/S2 +, no rubs, no gallops Respiratory: CTA bilaterally, no wheezing, no rhonchi Abdominal: Soft, NT, ND, bowel sounds + Extremities: no edema, no cyanosis    The results of significant diagnostics from this hospitalization (including imaging, microbiology, ancillary and laboratory) are listed below for reference.     Microbiology: No results found for this or any previous visit (from the past 240 hour(s)).   Labs: BNP (last 3 results) No results for input(s): "BNP" in the last 8760 hours. Basic Metabolic Panel: Recent Labs  Lab 12/26/21 0230 12/26/21 0550 12/26/21 1002 12/27/21 0515 12/29/21 0644  NA 137 137 135 137 136  K 3.6 4.0 3.8 4.0 4.5  CL 102 101 100 106 106  CO2 23 27 27 24 23   GLUCOSE 328* 274* 273* 135* 144*  BUN 29* 32* 33* 32* 18  CREATININE 1.54* 2.24* 2.09* 1.44* 1.24*  CALCIUM 9.1 8.9 8.9 8.9 9.1  MG  --   --   --  2.0 1.9  PHOS  --   --   --  4.4 4.5   Liver Function Tests: Recent Labs  Lab 12/25/21 1333 12/26/21 0550 12/27/21 0515 12/29/21 0644  AST 22 15 17 16   ALT 20 14 15 15   ALKPHOS 111 76 71 73  BILITOT 1.1 0.8 0.8 0.7  PROT 7.8 6.4* 5.8* 6.4*  ALBUMIN 3.5 2.7* 2.5* 2.8*   Recent Labs  Lab 12/25/21 1333  LIPASE 27   No results for input(s): "AMMONIA" in the last 168 hours. CBC: Recent Labs  Lab 12/25/21 1333 12/25/21 2210 12/26/21 0550 12/27/21 0515 12/29/21 0644  WBC 9.3 11.1* 13.2* 11.2* 8.4  NEUTROABS 7.5  --   --  4.5 3.0  HGB 12.4 12.7 10.1* 9.8* 9.9*  HCT 38.4 38.9 30.9* 30.4* 31.5*  MCV 88.3 89.0 89.3 89.4 90.8  PLT 428* 435* 357 339 382   Cardiac Enzymes: No results for  input(s): "CKTOTAL", "CKMB", "CKMBINDEX", "TROPONINI" in the last 168 hours. BNP: Invalid input(s): "POCBNP" CBG: Recent Labs  Lab 12/29/21 1649 12/29/21 2007 12/29/21 2317 12/30/21 0345 12/30/21 0728  GLUCAP 230* 196* 162* 144* 144*   D-Dimer No results for input(s): "DDIMER" in the last 72 hours. Hgb A1c No results for input(s): "HGBA1C" in the last 72 hours. Lipid Profile No results for input(s): "CHOL", "HDL", "LDLCALC", "TRIG", "CHOLHDL", "LDLDIRECT" in the last 72 hours. Thyroid function studies No results for input(s): "TSH", "T4TOTAL", "T3FREE", "THYROIDAB" in the last 72 hours.  Invalid input(s): "FREET3" Anemia work up No results for input(s): "VITAMINB12", "FOLATE", "FERRITIN", "TIBC", "IRON", "RETICCTPCT" in the  last 72 hours. Urinalysis    Component Value Date/Time   COLORURINE STRAW (A) 12/25/2021 1624   APPEARANCEUR CLEAR 12/25/2021 1624   LABSPEC 1.018 12/25/2021 1624   PHURINE 6.0 12/25/2021 1624   GLUCOSEU >=500 (A) 12/25/2021 1624   GLUCOSEU NEGATIVE 05/22/2020 1417   HGBUR SMALL (A) 12/25/2021 1624   BILIRUBINUR NEGATIVE 12/25/2021 1624   KETONESUR 20 (A) 12/25/2021 1624   PROTEINUR >=300 (A) 12/25/2021 1624   UROBILINOGEN 0.2 05/22/2020 1417   NITRITE NEGATIVE 12/25/2021 1624   LEUKOCYTESUR NEGATIVE 12/25/2021 1624   Sepsis Labs Recent Labs  Lab 12/25/21 2210 12/26/21 0550 12/27/21 0515 12/29/21 0644  WBC 11.1* 13.2* 11.2* 8.4   Microbiology No results found for this or any previous visit (from the past 240 hour(s)).   Time coordinating discharge: 32 minutes  SIGNED:   Dorcas Carrow, MD  [facility] 12/30/2021, 9:02 AM

## 2022-01-01 NOTE — Telephone Encounter (Signed)
Transition Care Management Unsuccessful Follow-up Telephone Call  Date of discharge and from where:  12/30/21 [facility] In[facility]. Dx: Gastroparesis  Attempts:  2nd Attempt  Reason for unsuccessful TCM follow-up call:  Voice mail full

## 2022-01-03 ENCOUNTER — Emergency Department (HOSPITAL_COMMUNITY): Payer: Medicaid Other

## 2022-01-03 ENCOUNTER — Other Ambulatory Visit: Payer: Self-pay

## 2022-01-03 ENCOUNTER — Encounter (HOSPITAL_COMMUNITY): Payer: Self-pay

## 2022-01-03 ENCOUNTER — Observation Stay (HOSPITAL_COMMUNITY)
Admission: EM | Admit: 2022-01-03 | Discharge: 2022-01-05 | Disposition: A | Discharge status: Home / Self Care | Payer: Medicaid Other | Attending: Internal Medicine | Admitting: Internal Medicine

## 2022-01-03 DIAGNOSIS — I129 Hypertensive chronic kidney disease with stage 1 through stage 4 chronic kidney disease, or unspecified chronic kidney disease: Secondary | ICD-10-CM | POA: Diagnosis not present

## 2022-01-03 DIAGNOSIS — E1040 Type 1 diabetes mellitus with diabetic neuropathy, unspecified: Secondary | ICD-10-CM | POA: Insufficient documentation

## 2022-01-03 DIAGNOSIS — E1022 Type 1 diabetes mellitus with diabetic chronic kidney disease: Secondary | ICD-10-CM | POA: Insufficient documentation

## 2022-01-03 DIAGNOSIS — N182 Chronic kidney disease, stage 2 (mild): Secondary | ICD-10-CM | POA: Diagnosis not present

## 2022-01-03 DIAGNOSIS — R112 Nausea with vomiting, unspecified: Secondary | ICD-10-CM | POA: Diagnosis not present

## 2022-01-03 DIAGNOSIS — E1143 Type 2 diabetes mellitus with diabetic autonomic (poly)neuropathy: Secondary | ICD-10-CM | POA: Diagnosis present

## 2022-01-03 DIAGNOSIS — N179 Acute kidney failure, unspecified: Secondary | ICD-10-CM | POA: Diagnosis not present

## 2022-01-03 DIAGNOSIS — E1010 Type 1 diabetes mellitus with ketoacidosis without coma: Secondary | ICD-10-CM | POA: Diagnosis not present

## 2022-01-03 DIAGNOSIS — E1065 Type 1 diabetes mellitus with hyperglycemia: Secondary | ICD-10-CM | POA: Insufficient documentation

## 2022-01-03 DIAGNOSIS — R000 Tachycardia, unspecified: Secondary | ICD-10-CM | POA: Diagnosis not present

## 2022-01-03 DIAGNOSIS — Z79899 Other long term (current) drug therapy: Secondary | ICD-10-CM | POA: Insufficient documentation

## 2022-01-03 DIAGNOSIS — R1013 Epigastric pain: Secondary | ICD-10-CM | POA: Diagnosis not present

## 2022-01-03 DIAGNOSIS — Z794 Long term (current) use of insulin: Secondary | ICD-10-CM | POA: Diagnosis not present

## 2022-01-03 DIAGNOSIS — E1042 Type 1 diabetes mellitus with diabetic polyneuropathy: Secondary | ICD-10-CM

## 2022-01-03 DIAGNOSIS — I878 Other specified disorders of veins: Secondary | ICD-10-CM | POA: Diagnosis not present

## 2022-01-03 LAB — URINALYSIS, ROUTINE W REFLEX MICROSCOPIC
Bacteria, UA: NONE SEEN
Bilirubin Urine: NEGATIVE
Glucose, UA: >=500 mg/dL — AB
Ketones, ur: 20 mg/dL — AB
Leukocytes,Ua: NEGATIVE
Nitrite: NEGATIVE
Protein, ur: >=300 mg/dL — AB
Specific Gravity, Urine: 1.016 (ref 1.005–1.030)
pH: 6 (ref 5.0–8.0)

## 2022-01-03 LAB — COMPREHENSIVE METABOLIC PANEL
ALT: 22 U/L (ref 0–44)
AST: 25 U/L (ref 15–41)
Albumin: 3.4 g/dL — ABNORMAL LOW (ref 3.5–5.0)
Alkaline Phosphatase: 87 U/L (ref 38–126)
Anion gap: 15 (ref 5–15)
BUN: 19 mg/dL (ref 6–20)
CO2: 20 mmol/L — ABNORMAL LOW (ref 22–32)
Calcium: 9.5 mg/dL (ref 8.9–10.3)
Chloride: 99 mmol/L (ref 98–111)
Creatinine, Ser: 1.51 mg/dL — ABNORMAL HIGH (ref 0.44–1.00)
GFR, Estimated: 47 mL/min — ABNORMAL LOW (ref >60)
Glucose, Bld: 440 mg/dL — ABNORMAL HIGH (ref 70–99)
Potassium: 4.1 mmol/L (ref 3.5–5.1)
Sodium: 134 mmol/L — ABNORMAL LOW (ref 135–145)
Total Bilirubin: 1.1 mg/dL (ref 0.3–1.2)
Total Protein: 7.4 g/dL (ref 6.5–8.1)

## 2022-01-03 LAB — CBC WITH DIFFERENTIAL/PLATELET
Abs Immature Granulocytes: 0.02 10*3/uL (ref 0.00–0.07)
Basophils Absolute: 0 10*3/uL (ref 0.0–0.1)
Basophils Relative: 0 %
Eosinophils Absolute: 0 10*3/uL (ref 0.0–0.5)
Eosinophils Relative: 1 %
HCT: 34.8 % — ABNORMAL LOW (ref 36.0–46.0)
Hemoglobin: 11.7 g/dL — ABNORMAL LOW (ref 12.0–15.0)
Immature Granulocytes: 0 %
Lymphocytes Relative: 36 %
Lymphs Abs: 1.6 10*3/uL (ref 0.7–4.0)
MCH: 28.9 pg (ref 26.0–34.0)
MCHC: 33.6 g/dL (ref 30.0–36.0)
MCV: 85.9 fL (ref 80.0–100.0)
Monocytes Absolute: 0.2 10*3/uL (ref 0.1–1.0)
Monocytes Relative: 5 %
Neutro Abs: 2.6 10*3/uL (ref 1.7–7.7)
Neutrophils Relative %: 58 %
Platelets: UNDETERMINED 10*3/uL (ref 150–400)
RBC: 4.05 MIL/uL (ref 3.87–5.11)
RDW: 12.7 % (ref 11.5–15.5)
WBC: 4.5 10*3/uL (ref 4.0–10.5)
nRBC: 0 % (ref 0.0–0.2)

## 2022-01-03 LAB — CBG MONITORING, ED: Glucose-Capillary: 403 mg/dL — ABNORMAL HIGH (ref 70–99)

## 2022-01-03 LAB — I-STAT BETA HCG BLOOD, ED (MC, WL, AP ONLY): I-stat hCG, quantitative: <5 m[IU]/mL (ref <5)

## 2022-01-03 LAB — RAPID URINE DRUG SCREEN, HOSP PERFORMED
Amphetamines: NOT DETECTED
Barbiturates: NOT DETECTED
Benzodiazepines: NOT DETECTED
Cocaine: NOT DETECTED
Opiates: POSITIVE — AB
Tetrahydrocannabinol: NOT DETECTED

## 2022-01-03 LAB — LIPASE, BLOOD: Lipase: 31 U/L (ref 11–51)

## 2022-01-03 MED ORDER — METOCLOPRAMIDE HCL 5 MG/ML IJ SOLN
10.0000 mg | Freq: Once | INTRAMUSCULAR | Status: AC
  Administered 2022-01-03: 10 mg via INTRAVENOUS
  Filled 2022-01-03: qty 2

## 2022-01-03 MED ORDER — DROPERIDOL 2.5 MG/ML IJ SOLN
1.2500 mg | Freq: Once | INTRAMUSCULAR | Status: AC
  Administered 2022-01-03: 1.25 mg via INTRAVENOUS
  Filled 2022-01-03: qty 2

## 2022-01-03 MED ORDER — PANTOPRAZOLE SODIUM 40 MG IV SOLR
40.0000 mg | Freq: Once | INTRAVENOUS | Status: AC
  Administered 2022-01-03: 40 mg via INTRAVENOUS
  Filled 2022-01-03: qty 10

## 2022-01-03 MED ORDER — KETOROLAC TROMETHAMINE 15 MG/ML IJ SOLN
15.0000 mg | Freq: Once | INTRAMUSCULAR | Status: AC
  Administered 2022-01-03: 15 mg via INTRAVENOUS
  Filled 2022-01-03: qty 1

## 2022-01-03 MED ORDER — HYDROMORPHONE HCL 1 MG/ML IJ SOLN
1.0000 mg | Freq: Once | INTRAMUSCULAR | Status: AC
  Administered 2022-01-03: 1 mg via INTRAVENOUS
  Filled 2022-01-03: qty 1

## 2022-01-03 MED ORDER — MORPHINE SULFATE (PF) 4 MG/ML IV SOLN
4.0000 mg | Freq: Once | INTRAVENOUS | Status: AC
  Administered 2022-01-03: 4 mg via INTRAVENOUS
  Filled 2022-01-03: qty 1

## 2022-01-03 MED ORDER — SODIUM CHLORIDE 0.9 % IV BOLUS
1000.0000 mL | Freq: Once | INTRAVENOUS | Status: AC
  Administered 2022-01-03: 1000 mL via INTRAVENOUS

## 2022-01-03 NOTE — ED Triage Notes (Signed)
Patient said she cannot stop vomiting. History of gastroparesis.

## 2022-01-03 NOTE — ED Notes (Signed)
EDP at bedside  

## 2022-01-03 NOTE — ED Notes (Signed)
Patient continues to come out of room.  Will come to nursing station or walk around department to locate this RN.  Demanding more pain medication.  Patient informed multiple times that she needs to remain in room.  When administering medication, patient request it "be pushed fast."  Patient educated that medication needed to be pushed at a controlled rate and not be given rapidly.

## 2022-01-03 NOTE — ED Notes (Signed)
Lab called and made aware of need to add urine drug screen to UA.

## 2022-01-03 NOTE — ED Notes (Signed)
Medication administered per order.  Patient requesting more pain medication and "something stronger."

## 2022-01-03 NOTE — ED Provider Notes (Signed)
[facility]-EMERGENCY DEPT Provider Note   CSN: [id number] Arrival date & time: 01/03/22  1710     History  Chief Complaint  Patient presents with   Emesis    Kelli Hudson is a 31 y.o. female. With past medical history of type 1 diabetes, hypertension, gastroparesis who presents to the emergency department with vomiting.   States symptoms began this afternoon. She describes having sharp epigastric abdominal pain that is non-radiating and severe. It is also burning. She has associated multiple episodes of nausea and vomiting. States that there have been episodes of dark brown vomit today. She denies diarrhea or constipation. States this feels like her gastroparesis. Last PO intake was this morning but has not tolerated PO since. She denies dysuria, vaginal discharge, fever.    Emesis Associated symptoms: abdominal pain   Associated symptoms: no diarrhea and no fever        Home Medications Prior to Admission medications   Medication Sig Start Date End Date Taking? Authorizing Provider  atorvastatin (LIPITOR) 20 MG tablet Take 1 tablet (20 mg total) by mouth daily. 10/29/21   Loyola Mast, MD  carvedilol (COREG) 12.5 MG tablet Take 0.5 tablets (6.25 mg total) by mouth 2 (two) times daily with a meal. 12/30/21   Dorcas Carrow, MD  Continuous Blood Gluc Sensor (DEXCOM G6 SENSOR) MISC Inject 1 Device into the skin See admin instructions. Place 1 sensor into the skin every 10 days after removing the former one    [provider]  Continuous Blood Gluc Transmit (DEXCOM G6 TRANSMITTER) MISC Change every 90 days 11/29/21   Shamleffer, Konrad Dolores, MD  dicyclomine (BENTYL) 20 MG tablet Take 1 tablet (20 mg total) by mouth 3 (three) times daily as needed for spasms. Patient not taking: Reported on 12/25/2021 11/08/21   Kathlen Mody, MD  gabapentin (NEURONTIN) 300 MG capsule Take 1 capsule (300 mg total) by mouth 3 (three) times daily. 12/30/21  03/30/22  Dorcas Carrow, MD  hydrOXYzine (VISTARIL) 25 MG capsule Take 1 capsule (25 mg total) by mouth every 8 (eight) hours as needed. Patient taking differently: Take 25 mg by mouth every 8 (eight) hours as needed for anxiety. 07/31/21   Alwyn Ren, MD  insulin detemir (LEVEMIR FLEXTOUCH) 100 UNIT/ML FlexPen Inject 16 Units into the skin daily. Patient taking differently: Inject 16 Units into the skin daily before breakfast. 06/14/21   Burnadette Pop, MD  insulin regular (NOVOLIN R) 100 units/mL injection Inject 0-0.15 mLs (0-15 Units total) into the skin See admin instructions. Via pump.Start this medication after you follow up with your PCP Patient taking differently: Inject 0-15 Units into the skin See admin instructions. Inject 0-15 units into the skin three times a day with meals, per sliding scale 06/14/21   Burnadette Pop, MD  metoCLOPramide (REGLAN) 10 MG tablet Take 1 tablet (10 mg total) by mouth every 8 (eight) hours as needed for nausea or vomiting. Patient taking differently: Take 10 mg by mouth 3 (three) times daily before meals. 11/08/21   Kathlen Mody, MD  ondansetron (ZOFRAN) 4 MG tablet Take 4 mg by mouth every 8 (eight) hours as needed for nausea or vomiting.    [provider]  pantoprazole (PROTONIX) 40 MG tablet Take 1 tablet (40 mg total) by mouth 2 (two) times daily before a meal. Patient not taking: Reported on 12/25/2021 11/08/21   Kathlen Mody, MD  insulin aspart (NOVOLOG) 100 UNIT/ML FlexPen Inject 5 Units into the skin 3 (three) times  daily with meals. 02/23/18 07/05/19  Jerald Kief, MD      Allergies    Patient has no known allergies.    Review of Systems   Review of Systems  Constitutional:  Negative for fever.  Gastrointestinal:  Positive for abdominal pain, nausea and vomiting. Negative for abdominal distention, constipation and diarrhea.  Genitourinary:  Negative for dysuria.  All other systems reviewed and are negative.   Physical  Exam Updated Vital Signs BP (!) 212/184 (BP Location: Left Arm)   Pulse (!) 136   Temp 98.9 F (37.2 C) (Oral)   Resp (!) 26   SpO2 100%  Physical Exam Vitals and nursing note reviewed.  Constitutional:      General: She is in acute distress.     Appearance: Normal appearance. She is normal weight. She is ill-appearing. She is not toxic-appearing.  HENT:     Head: Normocephalic.     Mouth/Throat:     Mouth: Mucous membranes are moist.     Pharynx: Oropharynx is clear.  Eyes:     General: No scleral icterus.    Extraocular Movements: Extraocular movements intact.     Pupils: Pupils are equal, round, and reactive to light.  Cardiovascular:     Rate and Rhythm: Regular rhythm. Tachycardia present.     Pulses: Normal pulses.  Pulmonary:     Effort: Pulmonary effort is normal. No respiratory distress.  Abdominal:     General: Abdomen is flat. There is no distension.     Palpations: Abdomen is soft.     Tenderness: There is generalized abdominal tenderness and tenderness in the epigastric area. There is guarding. There is no rebound.  Musculoskeletal:     Cervical back: Neck supple.  Skin:    General: Skin is warm and dry.     Capillary Refill: Capillary refill takes less than 2 seconds.  Neurological:     General: No focal deficit present.     Mental Status: She is alert and oriented to person, place, and time. Mental status is at baseline.  Psychiatric:        Mood and Affect: Mood normal.        Behavior: Behavior normal.        Thought Content: Thought content normal.        Judgment: Judgment normal.     ED Results / Procedures / Treatments   Labs (all labs ordered are listed, but only abnormal results are displayed) Labs Reviewed  CBC WITH DIFFERENTIAL/PLATELET - Abnormal; Notable for the following components:      Result Value   Hemoglobin 11.7 (*)    HCT 34.8 (*)    All other components within normal limits  URINALYSIS, ROUTINE W REFLEX MICROSCOPIC -  Abnormal; Notable for the following components:   Color, Urine STRAW (*)    Glucose, UA >=500 (*)    Hgb urine dipstick SMALL (*)    Ketones, ur 20 (*)    Protein, ur >=300 (*)    All other components within normal limits  COMPREHENSIVE METABOLIC PANEL - Abnormal; Notable for the following components:   Sodium 134 (*)    CO2 20 (*)    Glucose, Bld 440 (*)    Creatinine, Ser 1.51 (*)    Albumin 3.4 (*)    GFR, Estimated 47 (*)    All other components within normal limits  LIPASE, BLOOD  BLOOD GAS, VENOUS  I-STAT BETA HCG BLOOD, ED ([facility], [facility], [facility] ONLY)  EKG EKG Interpretation  Date/Time:  Friday January 03 2022 19:24:15 EST Ventricular Rate:  122 PR Interval:  181 QRS Duration: 71 QT Interval:  301 QTC Calculation: 429 R Axis:   71 Text Interpretation: Sinus tachycardia LAE, consider biatrial enlargement Abnormal Q suggests anterior infarct Borderline repolarization abnormality Since last tracing rate faster Confirmed by Linwood Dibbles [phone]) on 01/03/2022 7:31:35 PM  Radiology No results found.  Procedures Procedures    Medications Ordered in ED Medications  droperidol (INAPSINE) 2.5 MG/ML injection 1.25 mg (has no administration in time range)  sodium chloride 0.9 % bolus 1,000 mL (has no administration in time range)  sodium chloride 0.9 % bolus 1,000 mL (0 mLs Intravenous Stopped 01/03/22 1955)  metoCLOPramide (REGLAN) injection 10 mg (10 mg Intravenous Given 01/03/22 1855)  morphine (PF) 4 MG/ML injection 4 mg (4 mg Intravenous Given 01/03/22 1855)  pantoprazole (PROTONIX) injection 40 mg (40 mg Intravenous Given 01/03/22 1950)  ketorolac (TORADOL) 15 MG/ML injection 15 mg (15 mg Intravenous Given 01/03/22 1950)  HYDROmorphone (DILAUDID) injection 1 mg (1 mg Intravenous Given 01/03/22 2028)    ED Course/ Medical Decision Making/ A&P                           Medical Decision Making Amount and/or Complexity of Data Reviewed Labs: ordered.  Risk Prescription  drug management.  This patient presents to the ED with chief complaint(s) of vomiting and abdominal pain with pertinent past medical history of gastroparesis, T1DM which further complicates the presenting complaint. The complaint involves an extensive differential diagnosis and also carries with it a high risk of complications and morbidity.    The differential diagnosis includes Acute hepatobiliary disease, pancreatitis, appendicitis, PUD, gastritis, SBO, diverticulitis, colitis, viral gastroenteritis, Crohn's, UC, vascular catastrophe, UTI, pyelonephritis, renal stone, obstructed stone, infected stone, ovarian torsion, ectopic pregnancy, TOA, PID, STD, etc.    Additional history obtained: Additional history obtained from  none available Records reviewed Care Everywhere/External Records and Primary Care Documents  ED Course and Reassessment: 31 year old female who presents with abdominal pain, nausea, vomiting.  Abdomen is soft. She has diffuse tenderness and dry heaving. She is uncomfortable on exam, pacing the room and leaning over the bed intermittently. Obtaining basic labs including lipase, UA, venous gas given history of T1DM. Will start with IVF, morphine, reglan, protonix.   On reassessment - cbc without leukocytosis. Cmp with stable creatinine. Glucose is 440 without AG, CO2 20 - obtaining venous gas to evaluate if acidotic.  UA negative. Lipase negative.  Qtc 429.   Patient continues to be in pain on assessment. Will give toradol.  Toradol unsuccessful so will given 1mg  dilaudid.  On reassessment dilaudid somewhat helpful but continues to complain of severe nausea. Will give droperidol and 2nd bolus. If she does not improve, will need PO trial/admission for intractable abdominal pain. Care of patient handed off to , PA-C at this time. Pending reassessment with droperidol. Please see his note for completion of care  Final Clinical Impression(s) / ED Diagnoses Final  diagnoses:  None    Rx / DC Orders ED Discharge Orders     None         Berle Mull, PA-C 01/03/22 2210    01/05/22, DO 01/05/22 1500

## 2022-01-03 NOTE — ED Provider Notes (Signed)
Patient received at shift change from Theodis Blaze, PA-C please see note for full detail  In short patient with medical history including type 1 diabetes, severe gastroparesis presents emerged for complaints of abdominal pain nausea vomiting, states that this started today, states pain is in her epigastric region, does not radiate, remains constant, describes a sharp-like sensation, states that is slightly worse than her usual, she states that she has been ankle holding down, she said she has had some slight tinged emesis but denies any coffee-ground emesis, she still passing gas having normal bowel movements no melena or hematochezia, no recent stomach surgeries no history of bowel obstructions, she denies any alcohol use or NSAID use, no history of stomach ulcers.  Per previous provider follow-up on patient after antiemetics if still unimproved would recommend admission.   Physical Exam  BP 134/84 (BP Location: Left Arm)   Pulse (!) 127   Temp 98.2 F (36.8 C) (Oral)   Resp (!) 25   SpO2 100%   Physical Exam Vitals and nursing note reviewed.  Constitutional:      General: She is not in acute distress.    Appearance: She is not ill-appearing.  HENT:     Head: Normocephalic and atraumatic.     Nose: No congestion.  Eyes:     Conjunctiva/sclera: Conjunctivae normal.  Cardiovascular:     Rate and Rhythm: Regular rhythm. Tachycardia present.     Pulses: Normal pulses.     Heart sounds: No murmur heard.    No friction rub. No gallop.  Pulmonary:     Effort: No respiratory distress.     Breath sounds: No wheezing, rhonchi or rales.  Abdominal:     Palpations: Abdomen is soft.     Tenderness: There is abdominal tenderness. There is no right CVA tenderness or left CVA tenderness.     Comments: Abdomen nondistended, soft, she is notably tender around the umbilicus, there is no guarding rebound tenderness or peritoneal sign negative Murphy sign McBurney point, she no CVA tenderness.   Skin:    General: Skin is warm and dry.  Neurological:     Mental Status: She is alert.  Psychiatric:        Mood and Affect: Mood normal.     Procedures  Procedures  ED Course / MDM    Medical Decision Making Amount and/or Complexity of Data Reviewed Labs: ordered. Radiology: ordered.  Risk Prescription drug management. Decision regarding hospitalization.     Lab Tests:  I Ordered, and personally interpreted labs.  The pertinent results include: CBC shows no sick anemia hemoglobin 11.7, CMP shows sodium 134, CO2 of 20, glucose 440, creatinine 1.51, albumin 3.4, lipase 31, hCG less than 5, rapid reduction positive for opiates, UA shows slight ketones   Imaging Studies ordered:  I ordered imaging studies including acute chest abdomen I independently visualized and interpreted imaging which showed negative for acute findings I agree with the radiologist interpretation   Cardiac Monitoring:  The patient was maintained on a cardiac monitor.  I personally viewed and interpreted the cardiac monitored which showed an underlying rhythm of: EKG without signs of ischemia   Medicines ordered and prescription drug management:  I ordered medication including Dilaudid I have reviewed the patients home medicines and have made adjustments as needed  Critical Interventions:  N/A   Reevaluation:  With stomach pain, reviewed patient's lab work similar findings includes elevated glucose without an anion gap, will add on rapid urine drug screen for rule  out of possible hyperemesis secondary to cannabis, also add on acute chest abdomen for out of possible bowel obstruction.  Patient was reassessed despite 2 L of fluids, droperidol, pain medications, she still remains nauseous, and tachycardic in the 120s to 130s, will recommend admission for further evaluation patient agreement this plan   Consultations Obtained:  I requested consultation with the Dr. Velia Meyer,  and  discussed lab and imaging findings as well as pertinent plan - they recommend: We will admit the patient    Test Considered:  CT AP-deferred to my suspicion for abdominal abnormality is low she has nonsurgical abdomen, no leukocytosis.    Rule out low suspicion for lower lobe pneumonia as lung sounds are clear bilaterally, will defer imaging at this time.  I have low suspicion for liver or gallbladder abnormality as she has no right upper quadrant tenderness, liver enzymes, alk phos, T bili all within normal limits.  Low suspicion for pancreatitis as lipase is within normal limits.  Low suspicion for ruptured stomach ulcer as she has no peritoneal sign present on exam.  Low suspicion for bowel obstruction as abdomen is nondistended normal bowel sounds, so passing gas and having normal bowel movements.  Low suspicion for complicated diverticulitis as she is nontoxic-appearing, vital signs reassuring no leukocytosis present.  Low suspicion for appendicitis as she has no right lower quadrant tenderness, vital signs reassuring.  Suspicion for AAA or dissection is also low presentation is atypical, no bulging mass, she has low risk factors.  I have low suspicion for intra-abdominal infection as she has low risk factors, vital signs reassuring, no leukocytosis, will defer imaging at this time.  Suspicion for DKA is low at this time, no anion gap, no significant decrease in CO2.     Dispostion and problem list  After consideration of the diagnostic results and the patients response to treatment, I feel that the patent would benefit from admission.  Intractable nausea-suspect secondary due to gastroparesis, will need continued antiemetics as well as pain management. Tachycardia-secondary intractable nausea vomiting suspect once this is improved tachycardia will improve as well.           Marcello Fennel, PA-C 01/04/22 Alain Marion, MD 01/15/22 2036

## 2022-01-03 NOTE — ED Notes (Signed)
IV consult in room.

## 2022-01-03 NOTE — Telephone Encounter (Signed)
Transition Care Management Unsuccessful Follow-up Telephone Call  Date of discharge and from where:  12/30/21 [facility]. Dx: Gastroparesis  Attempts:  3rd Attempt  Reason for unsuccessful TCM follow-up call:  Voice mail full  Letter sent.

## 2022-01-04 DIAGNOSIS — R112 Nausea with vomiting, unspecified: Principal | ICD-10-CM

## 2022-01-04 LAB — GLUCOSE, CAPILLARY
Glucose-Capillary: 334 mg/dL — ABNORMAL HIGH (ref 70–99)
Glucose-Capillary: 211 mg/dL — ABNORMAL HIGH (ref 70–99)
Glucose-Capillary: 163 mg/dL — ABNORMAL HIGH (ref 70–99)
Glucose-Capillary: 212 mg/dL — ABNORMAL HIGH (ref 70–99)
Glucose-Capillary: 188 mg/dL — ABNORMAL HIGH (ref 70–99)

## 2022-01-04 LAB — COMPREHENSIVE METABOLIC PANEL
ALT: 18 U/L (ref 0–44)
AST: 19 U/L (ref 15–41)
Albumin: 2.7 g/dL — ABNORMAL LOW (ref 3.5–5.0)
Alkaline Phosphatase: 72 U/L (ref 38–126)
Anion gap: 7 (ref 5–15)
BUN: 20 mg/dL (ref 6–20)
CO2: 23 mmol/L (ref 22–32)
Calcium: 8.4 mg/dL — ABNORMAL LOW (ref 8.9–10.3)
Chloride: 104 mmol/L (ref 98–111)
Creatinine, Ser: 1.37 mg/dL — ABNORMAL HIGH (ref 0.44–1.00)
GFR, Estimated: 53 mL/min — ABNORMAL LOW (ref >60)
Glucose, Bld: 286 mg/dL — ABNORMAL HIGH (ref 70–99)
Potassium: 4.2 mmol/L (ref 3.5–5.1)
Sodium: 134 mmol/L — ABNORMAL LOW (ref 135–145)
Total Bilirubin: 0.8 mg/dL (ref 0.3–1.2)
Total Protein: 6.1 g/dL — ABNORMAL LOW (ref 6.5–8.1)

## 2022-01-04 LAB — BLOOD GAS, VENOUS
Acid-Base Excess: 2 mmol/L (ref 0.0–2.0)
Bicarbonate: 28.9 mmol/L — ABNORMAL HIGH (ref 20.0–28.0)
O2 Saturation: 22.5 %
Patient temperature: 36.7
pCO2, Ven: 55 mmHg (ref 44–60)
pH, Ven: 7.32 (ref 7.25–7.43)
pO2, Ven: <31 mmHg — CL (ref 32–45)

## 2022-01-04 LAB — CBC WITH DIFFERENTIAL/PLATELET
Abs Immature Granulocytes: 0.06 10*3/uL (ref 0.00–0.07)
Basophils Absolute: 0.1 10*3/uL (ref 0.0–0.1)
Basophils Relative: 0 %
Eosinophils Absolute: 0 10*3/uL (ref 0.0–0.5)
Eosinophils Relative: 0 %
HCT: 29.4 % — ABNORMAL LOW (ref 36.0–46.0)
Hemoglobin: 9.9 g/dL — ABNORMAL LOW (ref 12.0–15.0)
Immature Granulocytes: 0 %
Lymphocytes Relative: 32 %
Lymphs Abs: 4.4 10*3/uL — ABNORMAL HIGH (ref 0.7–4.0)
MCH: 28.9 pg (ref 26.0–34.0)
MCHC: 33.7 g/dL (ref 30.0–36.0)
MCV: 86 fL (ref 80.0–100.0)
Monocytes Absolute: 1.2 10*3/uL — ABNORMAL HIGH (ref 0.1–1.0)
Monocytes Relative: 9 %
Neutro Abs: 7.8 10*3/uL — ABNORMAL HIGH (ref 1.7–7.7)
Neutrophils Relative %: 59 %
Platelets: 457 10*3/uL — ABNORMAL HIGH (ref 150–400)
RBC: 3.42 MIL/uL — ABNORMAL LOW (ref 3.87–5.11)
RDW: 13 % (ref 11.5–15.5)
WBC: 13.5 10*3/uL — ABNORMAL HIGH (ref 4.0–10.5)
nRBC: 0 % (ref 0.0–0.2)

## 2022-01-04 LAB — MAGNESIUM: Magnesium: 1.5 mg/dL — ABNORMAL LOW (ref 1.7–2.4)

## 2022-01-04 MED ORDER — MELATONIN 3 MG PO TABS: 3.0000 mg | ORAL_TABLET | Freq: Every evening | ORAL | Status: DC | PRN

## 2022-01-04 MED ORDER — ACETAMINOPHEN 325 MG PO TABS: 650.0000 mg | ORAL_TABLET | Freq: Four times a day (QID) | ORAL | Status: DC | PRN

## 2022-01-04 MED ORDER — ACETAMINOPHEN 650 MG RE SUPP: 650.0000 mg | Freq: Four times a day (QID) | RECTAL | Status: DC | PRN

## 2022-01-04 MED ORDER — INSULIN DETEMIR 100 UNIT/ML ~~LOC~~ SOLN
12.0000 [IU] | Freq: Every day | SUBCUTANEOUS | Status: DC
  Administered 2022-01-04 – 2022-01-05 (×2): 12 [IU] via SUBCUTANEOUS
  Filled 2022-01-04 (×3): qty 0.12

## 2022-01-04 MED ORDER — METOCLOPRAMIDE HCL 5 MG/ML IJ SOLN
10.0000 mg | Freq: Three times a day (TID) | INTRAMUSCULAR | Status: DC
  Administered 2022-01-04 – 2022-01-05 (×5): 10 mg via INTRAVENOUS
  Filled 2022-01-04 (×5): qty 2

## 2022-01-04 MED ORDER — HYDROMORPHONE HCL 1 MG/ML IJ SOLN
1.0000 mg | INTRAMUSCULAR | Status: DC | PRN
  Administered 2022-01-04 – 2022-01-05 (×8): 1 mg via INTRAVENOUS
  Filled 2022-01-04 (×8): qty 1

## 2022-01-04 MED ORDER — MAGNESIUM SULFATE 2 GM/50ML IV SOLN
2.0000 g | Freq: Once | INTRAVENOUS | Status: AC
  Administered 2022-01-04: 2 g via INTRAVENOUS
  Filled 2022-01-04: qty 50

## 2022-01-04 MED ORDER — INSULIN ASPART 100 UNIT/ML IJ SOLN
0.0000 [IU] | Freq: Every day | INTRAMUSCULAR | Status: DC
  Administered 2022-01-04: 4 [IU] via SUBCUTANEOUS
  Filled 2022-01-04: qty 0.05

## 2022-01-04 MED ORDER — CARVEDILOL 6.25 MG PO TABS
6.2500 mg | ORAL_TABLET | Freq: Two times a day (BID) | ORAL | Status: DC
  Administered 2022-01-04 – 2022-01-05 (×3): 6.25 mg via ORAL
  Filled 2022-01-04 (×3): qty 1

## 2022-01-04 MED ORDER — INSULIN ASPART 100 UNIT/ML IJ SOLN
0.0000 [IU] | Freq: Three times a day (TID) | INTRAMUSCULAR | Status: DC
  Administered 2022-01-04: 5 [IU] via SUBCUTANEOUS
  Administered 2022-01-04: 3 [IU] via SUBCUTANEOUS
  Administered 2022-01-04 – 2022-01-05 (×3): 5 [IU] via SUBCUTANEOUS
  Filled 2022-01-04: qty 0.15

## 2022-01-04 MED ORDER — NALOXONE HCL 0.4 MG/ML IJ SOLN: 0.4000 mg | INTRAMUSCULAR | Status: DC | PRN

## 2022-01-04 MED ORDER — SODIUM CHLORIDE 0.9 % IV SOLN: INTRAVENOUS | Status: DC

## 2022-01-04 MED ORDER — SODIUM CHLORIDE 0.9 % IV BOLUS
1000.0000 mL | Freq: Once | INTRAVENOUS | Status: AC
  Administered 2022-01-04: 1000 mL via INTRAVENOUS

## 2022-01-04 MED ORDER — ONDANSETRON HCL 4 MG/2ML IJ SOLN
4.0000 mg | Freq: Four times a day (QID) | INTRAMUSCULAR | Status: DC | PRN
  Administered 2022-01-05: 4 mg via INTRAVENOUS
  Filled 2022-01-04: qty 2

## 2022-01-04 MED ORDER — LORAZEPAM 2 MG/ML IJ SOLN: 0.5000 mg | Freq: Four times a day (QID) | INTRAMUSCULAR | Status: DC | PRN

## 2022-01-04 NOTE — H&P (Signed)
[facility] History and Physical  Shreeya Gavitt [id number] DOB: 1990-06-19 DOA: 01/03/2022 PCP: [name], MD  Admitted from: home Chief Complaint: Intractable nausea, vomiting  History of Present Illness: Cladie Ramberg is a 31 y.o. female with PMH significant for DM1, severe gastroparesis, GERD, chronic anemia, anxiety/depression neuropathy, nephropathy. She has been hospitalized 17 times in the last 1 year, 11 times in last 5 months only for the same problem of intractable nausea, vomiting. Recently hospitalized 11/8 to 11/13 with same complaint.   There have been multiple documentations of her narcotic seeking behaviors.   Traditionally she gets inpatient management with IV Reglan, IV Dilaudid, IV fluid.  IV erythromycin was also tried 1 time in the past.  Other times it could not be because of prolonged QTc. She had been previously recommended a trial of Motegrity, domperidone as well as an outpatient.  Her treatment options are also limited because of prolonged QTc.  GI attending Dr. Havery Moros had referred her to tertiary centers but apparently she has been declined those centers as well.   11/17, patient presented to the ED with intractable vomiting, inability tolerate oral intake, sharp severe burning epigastric pain.  Reported dark brown vomitus as well. At the ED, patient was afebrile, heart rate in 120s, blood pressure elevated to 150s, breathing on room air. Labs with WC count normal at 4.5, hemoglobin 11.7, platelet count elevated, sodium 134, potassium 4.1, BUN/creatinine 19/1.51, blood glucose level elevated to 440, and abnormal. X-ray abdomen did not show any bowel obstruction or free air, it showed small gastric air-fluid level which can be seen with gastroparesis or recent fluid/food intake. EKG with Qtc at 429 ms Hospitalist service was consulted for inpatient admission and management.  I received this patient has a caregiver admission from  last night. At the time of my evaluation, patient was lying comfortably in the bed.  She was able to tolerate her breakfast.  Family not at bedside. Repeat labs from this morning showed improvement in creatinine to 1.37, magnesium low at 1.5, WBC count up to 13.5, hemoglobin down to 9.9.   Review of Systems:  All systems were reviewed and were negative unless otherwise mentioned in the HPI   Past medical history: Past Medical History:  Diagnosis Date   Acute H. pylori gastric ulcer    Coffee ground emesis    Diabetes mellitus ([place])    Diabetic gastroparesis ([place])    DKA (diabetic ketoacidosis) ([place]) 02/24/2021   Gastroparesis    GERD (gastroesophageal reflux disease)    Hypertension    Hyperthyroidism    Intractable nausea and vomiting 04/20/2021   Normocytic anemia 04/20/2020   Prolonged Q-T interval on ECG     Past surgical history: Past Surgical History:  Procedure Laterality Date   AMPUTATION TOE Left 03/10/2018   Procedure: AMPUTATION FIFTH TOE;  Surgeon: Trula Slade, DPM;  Location: [place];  Service: Podiatry;  Laterality: Left;   BIOPSY  01/28/2020   Procedure: BIOPSY;  Surgeon: Otis Brace, MD;  Location: [facility];  Service: Gastroenterology;;   BOTOX INJECTION  08/26/2021   Procedure: BOTOX INJECTION;  Surgeon: Doran Stabler, MD;  Location: [facility];  Service: Gastroenterology;;   ESOPHAGOGASTRODUODENOSCOPY N/A 01/28/2020   Procedure: ESOPHAGOGASTRODUODENOSCOPY (EGD);  Surgeon: Otis Brace, MD;  Location: [facility];  Service: Gastroenterology;  Laterality: N/A;   ESOPHAGOGASTRODUODENOSCOPY (EGD) WITH PROPOFOL Left 09/08/2015   Procedure: ESOPHAGOGASTRODUODENOSCOPY (EGD) WITH PROPOFOL;  Surgeon: Arta Silence, MD;  Location: [place];  Service:  Endoscopy;  Laterality: Left;   ESOPHAGOGASTRODUODENOSCOPY (EGD) WITH PROPOFOL N/A 08/26/2021   Procedure: ESOPHAGOGASTRODUODENOSCOPY (EGD) WITH PROPOFOL;  Surgeon: Doran Stabler, MD;  Location: [facility];  Service: Gastroenterology;  Laterality: N/A;   WISDOM TOOTH EXTRACTION      Social History:  reports that she has never smoked. She has never used smokeless tobacco. She reports current alcohol use of about 1.0 standard drink of alcohol per week. She reports that she does not use drugs.  Allergies:  No Known Allergies Patient has no known allergies.   Family history:  Family History  Problem Relation Age of Onset   Lung cancer Mother    Diabetes Mother    Bipolar disorder Father    Liver cancer Maternal Grandfather    Diabetes Maternal Aunt        x 2   Pancreatic cancer Maternal Uncle    Prostate cancer Maternal Uncle    Breast cancer Other        maternal great aunt   Heart disease Other    Ovarian cancer Other        maternal great aunt   Kidney disease Other        maternal great aunt   Colon cancer Neg Hx    Stomach cancer Neg Hx      Home Meds: Prior to Admission medications   Medication Sig Start Date End Date Taking? Authorizing Provider  atorvastatin (LIPITOR) 20 MG tablet Take 1 tablet (20 mg total) by mouth daily. 10/29/21  Yes [name], MD  carvedilol (COREG) 12.5 MG tablet Take 0.5 tablets (6.25 mg total) by mouth 2 (two) times daily with a meal. 12/30/21  Yes Ghimire, Dante Gang, MD  gabapentin (NEURONTIN) 300 MG capsule Take 1 capsule (300 mg total) by mouth 3 (three) times daily. 12/30/21 03/30/22 Yes Barb Merino, MD  hydrOXYzine (VISTARIL) 25 MG capsule Take 1 capsule (25 mg total) by mouth every 8 (eight) hours as needed. Patient taking differently: Take 25 mg by mouth every 8 (eight) hours as needed for anxiety. 07/31/21  Yes Georgette Shell, MD  insulin detemir (LEVEMIR FLEXTOUCH) 100 UNIT/ML FlexPen Inject 16 Units into the skin daily. Patient taking differently: Inject 16 Units into the skin daily before breakfast. 06/14/21  Yes Adhikari, Amrit, MD  insulin regular (NOVOLIN R) 100 units/mL injection  Inject 0-0.15 mLs (0-15 Units total) into the skin See admin instructions. Via pump.Start this medication after you follow up with your PCP Patient taking differently: Inject 0-15 Units into the skin See admin instructions. Inject 0-15 units into the skin three times a day with meals, per sliding scale 06/14/21  Yes Adhikari, Tamsen Meek, MD  metoCLOPramide (REGLAN) 10 MG tablet Take 1 tablet (10 mg total) by mouth every 8 (eight) hours as needed for nausea or vomiting. Patient taking differently: Take 10 mg by mouth 3 (three) times daily before meals. 11/08/21  Yes Hosie Poisson, MD  ondansetron (ZOFRAN) 4 MG tablet Take 4 mg by mouth every 8 (eight) hours as needed for nausea or vomiting.   Yes [provider]  Continuous Blood Gluc Sensor (DEXCOM G6 SENSOR) MISC Inject 1 Device into the skin See admin instructions. Place 1 sensor into the skin every 10 days after removing the former one    [provider]  Continuous Blood Gluc Transmit (DEXCOM G6 TRANSMITTER) MISC Change every 90 days 11/29/21   Shamleffer, Melanie Crazier, MD  dicyclomine (BENTYL) 20 MG tablet Take 1 tablet (20 mg  total) by mouth 3 (three) times daily as needed for spasms. Patient not taking: Reported on 12/25/2021 11/08/21   Hosie Poisson, MD  pantoprazole (PROTONIX) 40 MG tablet Take 1 tablet (40 mg total) by mouth 2 (two) times daily before a meal. Patient not taking: Reported on 12/25/2021 11/08/21   Hosie Poisson, MD  insulin aspart (NOVOLOG) 100 UNIT/ML FlexPen Inject 5 Units into the skin 3 (three) times daily with meals. 02/23/18 07/05/19  Donne Hazel, MD    Physical Exam: Vitals:   01/04/22 0343 01/04/22 0518 01/04/22 0815 01/04/22 1107  BP: 115/76  (!) 150/101 (!) 127/91  Pulse: (!) 110  (!) 109 99  Resp: 18  20 15   Temp: 97.9 F (36.6 C)  98 F (36.7 C) 98.3 F (36.8 C)  TempSrc: Oral  Oral Oral  SpO2: 100%  99% 99%  Weight:  81.8 kg    Height:  5\' 8"  (1.727 m)     Wt Readings from Last 3  Encounters:  01/04/22 81.8 kg  12/28/21 82.5 kg  12/07/21 87 kg   Body mass index is 27.42 kg/m.  General exam: Young African-American female.  Lying on bed.   Skin: No rashes, lesions or ulcers. HEENT: Atraumatic, normocephalic, no obvious bleeding Lungs: Clear to auscultation bilaterally CVS: Mildly tachycardic, no murmur GI/Abd soft, mild tenderness in the epigastrium, bowel sound present CNS: Alert, awake, oriented x3 Psychiatry: Mood appropriate Extremities: No pedal edema, no calf tenderness     Consult Orders  (From admission, onward)           Start     Ordered   01/04/22 0113  Consult to hospitalist  Once       Provider:  (Not yet assigned)  Question Answer Comment  Place call to: [facility]   Reason for Consult Admit      01/04/22 0112            Labs on Admission:   CBC: Recent Labs  Lab 12/29/21 0644 01/03/22 1844 01/04/22 0601  WBC 8.4 4.5 13.5*  NEUTROABS 3.0 2.6 7.8*  HGB 9.9* 11.7* 9.9*  HCT 31.5* 34.8* 29.4*  MCV 90.8 85.9 86.0  PLT 382 PLATELET CLUMPS NOTED ON SMEAR, UNABLE TO ESTIMATE 457*    Basic Metabolic Panel: Recent Labs  Lab 12/29/21 0644 01/03/22 1900 01/04/22 0601  NA 136 134* 134*  K 4.5 4.1 4.2  CL 106 99 104  CO2 23 20* 23  GLUCOSE 144* 440* 286*  BUN 18 19 20   CREATININE 1.24* 1.51* 1.37*  CALCIUM 9.1 9.5 8.4*  MG 1.9  --  1.5*  PHOS 4.5  --   --     Liver Function Tests: Recent Labs  Lab 12/29/21 0644 01/03/22 1900 01/04/22 0601  AST 16 25 19   ALT 15 22 18   ALKPHOS 73 87 72  BILITOT 0.7 1.1 0.8  PROT 6.4* 7.4 6.1*  ALBUMIN 2.8* 3.4* 2.7*   Recent Labs  Lab 01/03/22 1900  LIPASE 31   No results for input(s): "AMMONIA" in the last 168 hours.  Cardiac Enzymes: No results for input(s): "CKTOTAL", "CKMB", "CKMBINDEX", "TROPONINI" in the last 168 hours.  BNP (last 3 results) No results for input(s): "BNP" in the last 8760 hours.  ProBNP (last 3 results) No results for input(s):  "PROBNP" in the last 8760 hours.  CBG: Recent Labs  Lab 12/30/21 0728 01/03/22 2336 01/04/22 0342 01/04/22 0804 01/04/22 1141  GLUCAP 144* 403* 334* 211* 163*    Lipase  Component Value Date/Time   LIPASE 31 01/03/2022 1900     Urinalysis    Component Value Date/Time   COLORURINE STRAW (A) 01/03/2022 1829   APPEARANCEUR CLEAR 01/03/2022 1829   LABSPEC 1.016 01/03/2022 1829   PHURINE 6.0 01/03/2022 1829   GLUCOSEU >=500 (A) 01/03/2022 1829   GLUCOSEU NEGATIVE 05/22/2020 1417   HGBUR SMALL (A) 01/03/2022 1829   BILIRUBINUR NEGATIVE 01/03/2022 1829   KETONESUR 20 (A) 01/03/2022 1829   PROTEINUR >=300 (A) 01/03/2022 1829   UROBILINOGEN 0.2 05/22/2020 1417   NITRITE NEGATIVE 01/03/2022 [place] 01/03/2022 1829     Drugs of Abuse     Component Value Date/Time   LABOPIA POSITIVE (A) 01/03/2022 1829   COCAINSCRNUR NONE DETECTED 01/03/2022 1829   LABBENZ NONE DETECTED 01/03/2022 1829   AMPHETMU NONE DETECTED 01/03/2022 1829   THCU NONE DETECTED 01/03/2022 1829   LABBARB NONE DETECTED 01/03/2022 1829      Radiological Exams on Admission: DG Abdomen Acute W/Chest  Result Date: 01/03/2022 CLINICAL DATA:  Continuous nausea and vomiting. Concern for bowel obstruction. Technologist notes state history of gastroparesis. EXAM: DG ABDOMEN ACUTE WITH 1 VIEW CHEST COMPARISON:  Abdominal radiograph 10/04/2021. CT 08/24/2021 FINDINGS: The cardiomediastinal contours are normal. The lungs are clear. There is no free intra-abdominal air. Small gastric air-fluid level but no abnormal gaseous distension. No dilated bowel loops to suggest obstruction. Small volume of stool in the right colon, air within nondilated distal colon. No radiopaque calculi. Left pelvic phlebolith. No acute osseous abnormalities are seen. IMPRESSION: No bowel obstruction or free air. Small gastric air-fluid level which can be seen with gastroparesis or recent fluid/food intake. Electronically  Signed   By: Keith Rake M.D.   On: 01/03/2022 23:25     ------------------------------------------------------------------------------------------------------ Assessment/Plan: Active Problems:   Intractable nausea and vomiting  Intractable nausea vomiting  Chronic severe gastroparesis  Recurrent hospitalization for symptoms flareup Presented with intractable vomiting, inability to tolerate oral intake and severe burning epigastric pain.   She has chronic severe gastroparesis with recurrent hospitalization for the same.  There have been concerns of drug-seeking behavior in the past. PTA on Reglan 10 mg 3 times daily, Zofran 4 mg 3 times daily as needed, Neurontin 300 mg 3 times daily In the ED, patient received 1 dose of domperidone, 2 doses of IV Dilaudid, 1 dose of Reglan, 1 dose of Toradol and Protonix. Continue IV hydration with normal saline at 150 mill per hour.  Start IV Reglan 10 mg scheduled.   Continue Zofran 4 mg every 6 hours as needed. If patient continues to have symptoms and EKG continues to show normal QTc, we may try IV erythromycin tomorrow. (Admission EKG with Qtc at 429 ms.)  Uncontrolled type I diabetes mellitus with hyperglycemia A1c 10.3 in August 2023 Blood sugar level over 400 at presentation. PTA on Levemir 16 units daily along with sliding scale insulin Given Levemir 12 units last night.  Currently on sliding scale insulin. 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  Continue to monitor with persistent vomiting Recent Labs    12/11/21 0457 12/25/21 1333 12/25/21 2210 12/26/21 0230 12/26/21 0550 12/26/21 1002 12/27/21 0515 12/29/21 0644 01/03/22 1900 01/04/22 0601  BUN 13 24* 22* 29* 32*  33* 32* 18 19 20   CREATININE 1.11*  1.22* 1.07* 1.54* 2.24* 2.09* 1.44* 1.24* 1.51* 1.37*   Hypomagnesemia Magnesium level low at 1.5 this morning.  Replacement given Recent Labs  Lab 12/29/21 0644 01/03/22 1900 01/04/22 0601  K 4.5 4.1 4.2  MG 1.9  --  1.5*  PHOS 4.5  --   --    Leukocytosis WBC count elevated to 13.5 this morning but without evidence of infection.  Probably stress related.  Continue to monitor. Recent Labs  Lab 12/29/21 0644 01/03/22 1844 01/04/22 0601  WBC 8.4 4.5 13.5*   Chronic anemia Hemoglobin remains close to 10 at baseline.  No active bleeding Recent Labs    04/23/21 0343 04/24/21 0342 06/28/21 1641 06/29/21 0804 12/26/21 0550 12/27/21 0515 12/29/21 0644 01/03/22 1844 01/04/22 0601  HGB 9.3*   < >  --    < > 10.1* 9.8* 9.9* 11.7* 9.9*  MCV 89.4   < >  --    < > 89.3 89.4 90.8 85.9 86.0  VITAMINB12 774  --   --   --   --   --   --   --   --   FOLATE 23.1  --   --   --   --   --   --   --   --   FERRITIN 20  --   --   --   --   --   --   --   --   TIBC 330  --  525*  --   --   --   --   --   --   IRON 54  --  78  --   --   --   --   --   --   RETICCTPCT 1.8  --   --   --   --   --   --   --   --    < > = values in this interval not displayed.   Hypertension PTA on Coreg 6.25 mg twice daily Blood pressure is controlled but patient is tachycardic.  We will try to resume oral Coreg.  If unable to tolerate, switch to IV metoprolol.  Hyperlipidemia Lipitor 20 mg daily  Anxiety PTA on Vistaril 25 mg 3 times daily as needed  Goals of care   Code Status: Full Code   Diet:  Diet Order             Diet Carb Modified Fluid consistency: Thin; Room service appropriate? Yes  Diet effective now                  DVT prophylaxis:  SCDs Start: 01/04/22 0213   Antimicrobials: None Fluid: NS at 150 mill per hour Consultants: None Family Communication: None at bedside Dispo: The patient is from: Home              Anticipated d/c is to: Home hopefully  tomorrow  ------------------------------------------------------------------------------------- Severity of Illness: The appropriate patient status for this patient is OBSERVATION. Observation status is judged to be reasonable and necessary in order to provide the required intensity of service to ensure the patient's safety. The patient's presenting symptoms, physical exam findings, and initial radiographic and laboratory data in the context of their medical condition is felt to place them at decreased risk for further clinical deterioration. Furthermore, it is anticipated that the patient will be medically stable for discharge from the hospital within 2 midnights of admission.  Signed, Lorin Glass, MD [facility] 01/04/2022

## 2022-01-04 NOTE — Progress Notes (Signed)
Critical Lab Value: pO2 less than 31 Chavez, NP notified No new orders at this time

## 2022-01-04 NOTE — Progress Notes (Signed)
  Carryover admission to the Day Admitter.  I discussed this case with the EDP, Berle Mull, PA.  Per these discussions:   This is a 31-year-old female with poorly controlled type 1 diabetes completed by diabetic gastroparesis with recurrent hospitalizations for intractable nausea/vomiting and epigastric discomfort, who is being admitted this evening for similar presenting with 1 day of intractable nausea/vomiting, inability to tolerate p.o., associated with epigastric pain, similar to that which she has previously experienced, with a EDP conveying no evidence of acute peritoneal signs on physical exam.   CBG elevated in the low 400s, but no evidence of anion gap.   Vital signs notable for persistent sinus tachycardia in spite of 2 L of normal saline.  Remains nauseous in spite of multiple doses of IV antiemetics.  I have placed an order for observation to med telemetry for further evaluation management of the above.  I have placed some additional preliminary admit orders via the adult multi-morbid admission order set. I have also ordered prn Zofran as well as as needed Ativan for nausea refractory to prn Zofran.  Prn Dilaudid.  Also ordered 1/3 L of normal saline bolus followed by initiation continuous NS at 150 cc/h.  I have ordered the majority of her AM basal to be given now.  Will place orders for Accu-Cheks with moderate insulin scale insulin, with these orders to be initiated now. Vbg pending.    Newton Pigg, DO Hospitalist

## 2022-01-04 NOTE — ED Notes (Signed)
Patient reports slight improvement in pain.  When administering pain medication, patient continues to request medication be "given faster."

## 2022-01-05 DIAGNOSIS — R112 Nausea with vomiting, unspecified: Secondary | ICD-10-CM | POA: Diagnosis not present

## 2022-01-05 LAB — GLUCOSE, CAPILLARY
Glucose-Capillary: 208 mg/dL — ABNORMAL HIGH (ref 70–99)
Glucose-Capillary: 236 mg/dL — ABNORMAL HIGH (ref 70–99)

## 2022-01-05 NOTE — Progress Notes (Signed)
Pt. Discharge via wheelchair, family came to bring her home. Pt. Is alert and oriented, no distress. Discharge instruction and education discussed with patient. All personal belongings are with the patient.

## 2022-01-05 NOTE — Progress Notes (Signed)
  Transition of Care [facility]) Screening Note   Patient Details  Name: Kelli Hudson Date of Birth: 1990-03-25   Transition of Care [facility]) CM/SW Contact:    Princella Ion, LCSW Phone Number: 01/05/2022, 1:07 PM    Transition of Care Department [facility]) has reviewed patient and no TOC needs have been identified at this time. We will continue to monitor patient advancement through interdisciplinary progression rounds. If new patient transition needs arise, please place a TOC consult.

## 2022-01-05 NOTE — Plan of Care (Signed)

## 2022-01-05 NOTE — Discharge Summary (Signed)
Physician Discharge Summary  Kelli Hudson [id number] DOB: 1990/04/21 DOA: 01/03/2022  PCP: [name], MD  Admit date: 01/03/2022 Discharge date: 01/05/2022  Admitted From: Home Discharge disposition: Home  Recommendations at discharge:  Ensure compliance with Reglan  Brief narrative: Kelli Hudson is a 31 y.o. female with PMH significant for DM1, severe gastroparesis, GERD, chronic anemia, anxiety/depression neuropathy, nephropathy. She has been hospitalized 17 times in the last 1 year, 11 times in last 5 months only for the same problem of intractable nausea, vomiting. Recently hospitalized 11/8 to 11/13 with same complaint.   There have been multiple documentations of her narcotic seeking behaviors.   Traditionally she gets inpatient management with IV Reglan, IV Dilaudid, IV fluid.  IV erythromycin was also tried 1 time in the past.  Other times it could not be because of prolonged QTc. She had been previously recommended a trial of Motegrity, domperidone as well as an outpatient.  Her treatment options are also limited because of prolonged QTc.  GI attending Dr. Havery Moros had referred her to tertiary centers but apparently she has been declined those centers as well.   11/17, patient presented to the ED with intractable vomiting, inability tolerate oral intake, sharp severe burning epigastric pain.  Reported dark brown vomitus as well. At the ED, patient was afebrile, heart rate in 120s, blood pressure elevated to 150s, breathing on room air. Labs with WC count normal at 4.5, hemoglobin 11.7, platelet count elevated, sodium 134, potassium 4.1, BUN/creatinine 19/1.51, blood glucose level elevated to 440, and abnormal. X-ray abdomen did not show any bowel obstruction or free air, it showed small gastric air-fluid level which can be seen with gastroparesis or recent fluid/food intake. EKG with Qtc at 429 ms Admitted to hospitalist  service  Subjective: Patient was seen and examined this morning.  Pleasant young African-American female.  Lying on bed.  Not in distress.  In the last 24 hours, she has tolerated oral intake. Epigastric pain improving.  Feels ready for discharge today.  Assessment/Plan: Intractable nausea vomiting  Chronic severe gastroparesis  Recurrent hospitalization for symptoms flareup Presented with intractable vomiting, inability to tolerate oral intake and severe burning epigastric pain.   She has chronic severe gastroparesis with recurrent hospitalization for the same.  There have been concerns of drug-seeking behavior in the past. PTA on Reglan 10 mg 3 times daily, Zofran 4 mg 3 times daily as needed, Neurontin 300 mg 3 times daily In the ED, patient received 1 dose of domperidone, 2 doses of IV Dilaudid, 1 dose of Reglan, 1 dose of Toradol and Protonix. She was observed for 24 hours.  Kept on IV normal saline, IV antiemetics.  She tolerated regular diet.  Feels ready for discharge today.  Uncontrolled type I diabetes mellitus with hyperglycemia A1c 10.3 in August 2023 Blood sugar level over 400 at presentation. PTA on Levemir 16 units daily along with sliding scale insulin Continue the same.  Encouraged to improve compliance with insulin and dietary restriction.  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   Recent Labs    12/11/21 0457 12/25/21 1333 12/25/21 2210 12/26/21 0230 12/26/21 0550 12/26/21 1002 12/27/21 0515 12/29/21 0644 01/03/22 1900 01/04/22 0601  BUN 13 24* 22* 29* 32* 33* 32* 18 19 20   CREATININE 1.11* 1.22* 1.07* 1.54* 2.24* 2.09* 1.44* 1.24* 1.51* 1.37*   Hypomagnesemia Magnesium level low at 1.5 this morning.  Replacement was given. Recent Labs  Lab 01/03/22 1900 01/04/22 0601  K 4.1 4.2  MG  --  1.5*   Leukocytosis WBC count elevated to 13.5 this morning but without evidence of infection.  Probably stress related.  No fever.    Recent Labs  Lab 01/03/22 1844 01/04/22 0601  WBC 4.5 13.5*   Chronic anemia Hemoglobin remains close to 10 at baseline.  No active bleeding Recent Labs    04/23/21 0343 04/24/21 0342 06/28/21 1641 06/29/21 0804 12/26/21 0550 12/27/21 0515 12/29/21 0644 01/03/22 1844 01/04/22 0601  HGB 9.3*   < >  --    < > 10.1* 9.8* 9.9* 11.7* 9.9*  MCV 89.4   < >  --    < > 89.3 89.4 90.8 85.9 86.0  VITAMINB12 774  --   --   --   --   --   --   --   --   FOLATE 23.1  --   --   --   --   --   --   --   --   FERRITIN 20  --   --   --   --   --   --   --   --   TIBC 330  --  525*  --   --   --   --   --   --   IRON 54  --  78  --   --   --   --   --   --   RETICCTPCT 1.8  --   --   --   --   --   --   --   --    < > = values in this interval not displayed.   Hypertension PTA on Coreg 6.25 mg twice daily.  Continue the same  Hyperlipidemia Lipitor 20 mg daily  Anxiety PTA on Vistaril 25 mg 3 times daily as needed  Wounds:  -    Discharge Exam:   Vitals:   01/04/22 0815 01/04/22 1107 01/04/22 1957 01/05/22 0500  BP: (!) 150/101 (!) 127/91 109/77 (!) 159/103  Pulse: (!) 109 99 (!) 101 (!) 104  Resp: 20 15 16    Temp: 98 F (36.7 C) 98.3 F (36.8 C) 98 F (36.7 C) 97.7 F (36.5 C)  TempSrc: Oral Oral Oral Oral  SpO2: 99% 99% 100% 100%  Weight:    84.8 kg  Height:        Body mass index is 28.43 kg/m.  General exam: Young African-American female.  Lying on bed.   Skin: No rashes, lesions or ulcers. HEENT: Atraumatic, normocephalic, no obvious bleeding Lungs: Clear to auscultation bilaterally  CVS: Regular rate and rhythm, no murmur GI/Abd soft, mild tenderness in the epigastrium, bowel sound present CNS: Alert, awake, oriented x3 Psychiatry: Mood appropriate Extremities: No pedal edema, no calf tenderness  Follow ups:    Follow-up Information     Rudd, , MD Follow up.   Specialty: Family Medicine Contact information: [address] [place] [phone]                 Discharge Instructions:   Discharge Instructions     Call MD for:  difficulty breathing, headache or visual disturbances   Complete by: As directed    Call MD for:  extreme fatigue   Complete by: As directed    Call MD for:  hives   Complete by: As directed    Call MD for:  persistant dizziness or light-headedness  Complete by: As directed    Call MD for:  persistant nausea and vomiting   Complete by: As directed    Call MD for:  severe uncontrolled pain   Complete by: As directed    Call MD for:  temperature >100.4   Complete by: As directed    Diet Carb Modified   Complete by: As directed    Discharge instructions   Complete by: As directed    Recommendations at discharge:   Ensure compliance with Reglan  Discharge instructions for diabetes mellitus: Check blood sugar 3 times a day and bedtime at home. If blood sugar running above 200 or less than 70 please call your MD to adjust insulin. If you notice signs and symptoms of hypoglycemia (low blood sugar) like jitteriness, confusion, thirst, tremor and sweating, please check blood sugar, drink sugary drink/biscuits/sweets to increase sugar level and call MD or return to ER.    General discharge instructions: Follow with Primary MD [name], MD in 7 days  Please request your PCP  to go over your hospital tests, procedures, radiology results at the follow up. Please get your medicines reviewed and adjusted.  Your PCP may decide to repeat certain labs or tests as needed. Do not drive, operate heavy machinery, perform activities at heights, swimming or participation in water activities or provide baby sitting services if your were admitted for syncope or siezures until you have seen by Primary MD or a Neurologist and advised to do so again. [place] Controlled Substance Reporting System database was reviewed. Do not drive, operate heavy machinery,  perform activities at heights, swim, participate in water activities or provide baby-sitting services while on medications for pain, sleep and mood until your outpatient physician has reevaluated you and advised to do so again.  You are strongly recommended to comply with the dose, frequency and duration of prescribed medications. Activity: As tolerated with Full fall precautions use walker/cane & assistance as needed Avoid using any recreational substances like cigarette, tobacco, alcohol, or non-prescribed drug. If you experience worsening of your admission symptoms, develop shortness of breath, life threatening emergency, suicidal or homicidal thoughts you must seek medical attention immediately by calling 911 or calling your MD immediately  if symptoms less severe. You must read complete instructions/literature along with all the possible adverse reactions/side effects for all the medicines you take and that have been prescribed to you. Take any new medicine only after you have completely understood and accepted all the possible adverse reactions/side effects.  Wear Seat belts while driving. You were cared for by a hospitalist during your hospital stay. If you have any questions about your discharge medications or the care you received while you were in the hospital after you are discharged, you can call the unit and ask to speak with the hospitalist or the covering physician. Once you are discharged, your primary care physician will handle any further medical issues. Please note that NO REFILLS for any discharge medications will be authorized once you are discharged, as it is imperative that you return to your primary care physician (or establish a relationship with a primary care physician if you do not have one).   Increase activity slowly   Complete by: As directed        Discharge Medications:   Allergies as of 01/05/2022   No Known Allergies      Medication List     TAKE these  medications    atorvastatin 20 MG tablet Commonly known  as: LIPITOR Take 1 tablet (20 mg total) by mouth daily.   carvedilol 12.5 MG tablet Commonly known as: COREG Take 0.5 tablets (6.25 mg total) by mouth 2 (two) times daily with a meal.   Dexcom G6 Sensor Misc Inject 1 Device into the skin See admin instructions. Place 1 sensor into the skin every 10 days after removing the former one   Dexcom G6 Transmitter Misc Change every 90 days   dicyclomine 20 MG tablet Commonly known as: BENTYL Take 1 tablet (20 mg total) by mouth 3 (three) times daily as needed for spasms.   gabapentin 300 MG capsule Commonly known as: NEURONTIN Take 1 capsule (300 mg total) by mouth 3 (three) times daily.   hydrOXYzine 25 MG capsule Commonly known as: VISTARIL Take 1 capsule (25 mg total) by mouth every 8 (eight) hours as needed. What changed: reasons to take this   insulin regular 100 units/mL injection Commonly known as: NovoLIN R Inject 0-0.15 mLs (0-15 Units total) into the skin See admin instructions. Via pump.Start this medication after you follow up with your PCP What changed: additional instructions   Levemir FlexTouch 100 UNIT/ML FlexPen Generic drug: insulin detemir Inject 16 Units into the skin daily. What changed: when to take this   metoCLOPramide 10 MG tablet Commonly known as: REGLAN Take 1 tablet (10 mg total) by mouth every 8 (eight) hours as needed for nausea or vomiting. What changed: when to take this   ondansetron 4 MG tablet Commonly known as: ZOFRAN Take 4 mg by mouth every 8 (eight) hours as needed for nausea or vomiting.   pantoprazole 40 MG tablet Commonly known as: PROTONIX Take 1 tablet (40 mg total) by mouth 2 (two) times daily before a meal.         The results of significant diagnostics from this hospitalization (including imaging, microbiology, ancillary and laboratory) are listed below for reference.    Procedures and Diagnostic Studies:   DG  Abdomen Acute W/Chest  Result Date: 01/03/2022 CLINICAL DATA:  Continuous nausea and vomiting. Concern for bowel obstruction. Technologist notes state history of gastroparesis. EXAM: DG ABDOMEN ACUTE WITH 1 VIEW CHEST COMPARISON:  Abdominal radiograph 10/04/2021. CT 08/24/2021 FINDINGS: The cardiomediastinal contours are normal. The lungs are clear. There is no free intra-abdominal air. Small gastric air-fluid level but no abnormal gaseous distension. No dilated bowel loops to suggest obstruction. Small volume of stool in the right colon, air within nondilated distal colon. No radiopaque calculi. Left pelvic phlebolith. No acute osseous abnormalities are seen. IMPRESSION: No bowel obstruction or free air. Small gastric air-fluid level which can be seen with gastroparesis or recent fluid/food intake. Electronically Signed   By: Keith Rake M.D.   On: 01/03/2022 23:25     Labs:   Basic Metabolic Panel: Recent Labs  Lab 01/03/22 1900 01/04/22 0601  NA 134* 134*  K 4.1 4.2  CL 99 104  CO2 20* 23  GLUCOSE 440* 286*  BUN 19 20  CREATININE 1.51* 1.37*  CALCIUM 9.5 8.4*  MG  --  1.5*   GFR Estimated Creatinine Clearance: 68.5 mL/min (A) (by C-G formula based on SCr of 1.37 mg/dL (H)). Liver Function Tests: Recent Labs  Lab 01/03/22 1900 01/04/22 0601  AST 25 19  ALT 22 18  ALKPHOS 87 72  BILITOT 1.1 0.8  PROT 7.4 6.1*  ALBUMIN 3.4* 2.7*   Recent Labs  Lab 01/03/22 1900  LIPASE 31   No results for input(s): "AMMONIA" in the last  168 hours. Coagulation profile No results for input(s): "INR", "PROTIME" in the last 168 hours.  CBC: Recent Labs  Lab 01/03/22 1844 01/04/22 0601  WBC 4.5 13.5*  NEUTROABS 2.6 7.8*  HGB 11.7* 9.9*  HCT 34.8* 29.4*  MCV 85.9 86.0  PLT PLATELET CLUMPS NOTED ON SMEAR, UNABLE TO ESTIMATE 457*   Cardiac Enzymes: No results for input(s): "CKTOTAL", "CKMB", "CKMBINDEX", "TROPONINI" in the last 168 hours. BNP: Invalid input(s):  "POCBNP" CBG: Recent Labs  Lab 01/04/22 1141 01/04/22 1638 01/04/22 2102 01/05/22 0739 01/05/22 1128  GLUCAP 163* 212* 188* 208* 236*   D-Dimer No results for input(s): "DDIMER" in the last 72 hours. Hgb A1c No results for input(s): "HGBA1C" in the last 72 hours. Lipid Profile No results for input(s): "CHOL", "HDL", "LDLCALC", "TRIG", "CHOLHDL", "LDLDIRECT" in the last 72 hours. Thyroid function studies No results for input(s): "TSH", "T4TOTAL", "T3FREE", "THYROIDAB" in the last 72 hours.  Invalid input(s): "FREET3" Anemia work up No results for input(s): "VITAMINB12", "FOLATE", "FERRITIN", "TIBC", "IRON", "RETICCTPCT" in the last 72 hours. Microbiology No results found for this or any previous visit (from the past 240 hour(s)).  Time coordinating discharge: 35 minutes  Signed: Magon Croson  [facility] 01/05/2022, 2:11 PM

## 2022-01-07 ENCOUNTER — Telehealth: Payer: Self-pay

## 2022-01-07 NOTE — Telephone Encounter (Signed)
Transition Care Management Unsuccessful Follow-up Telephone Call  Date of discharge and from where:  01/05/22 [facility]. Dx: Intractable N/V  Attempts:  1st Attempt  Reason for unsuccessful TCM follow-up call:  Left voice message

## 2022-01-08 NOTE — Telephone Encounter (Signed)
Transition Care Management Unsuccessful Follow-up Telephone Call  Date of discharge and from where:  01/05/22 [facility]. Dx: Intractable N/V   Attempts:  2nd Attempt  Reason for unsuccessful TCM follow-up call:  Left voice message

## 2022-01-15 ENCOUNTER — Encounter: Payer: Self-pay | Admitting: Internal Medicine

## 2022-01-15 IMAGING — CT CT ABD-PELV W/ CM
2 of 4 series · 16 of 46 positions shown, 18 images · IV contrast (omnipaque)
Comparison: 05/09/2020

CLINICAL DATA: Abdominal distention

EXAM:
CT ABDOMEN AND PELVIS WITH CONTRAST
TECHNIQUE: Multidetector CT imaging of the abdomen and pelvis was performed
using the standard protocol following bolus administration of
intravenous contrast.
CONTRAST:  100mL OMNIPAQUE IOHEXOL 300 MG/ML  SOLN

[Series 2: axial st · axial · 0.98mm/px · z∈[-618,-193]mm · 13 of 95 slices shown, 15 images]
[im 5/95  soft-tissue]
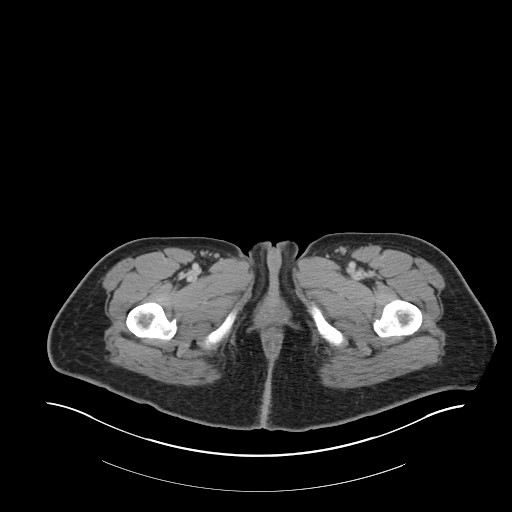
[im 5/95  bone]
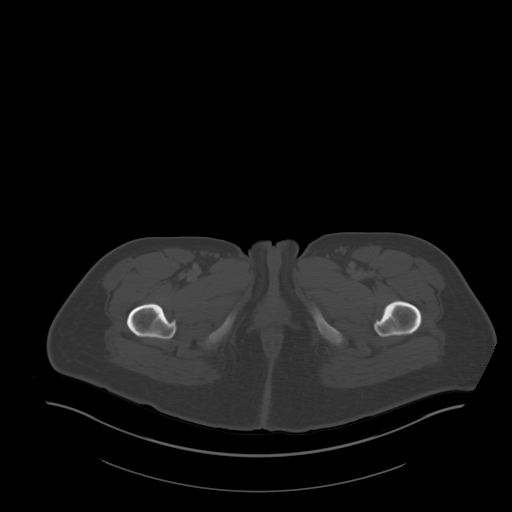
[im 15/95  soft-tissue]
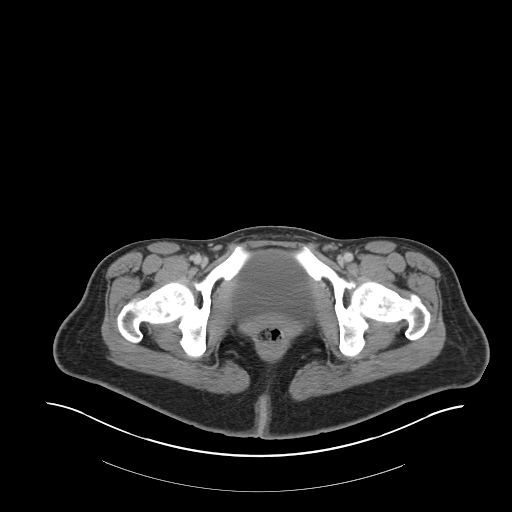
[im 20/95  soft-tissue]
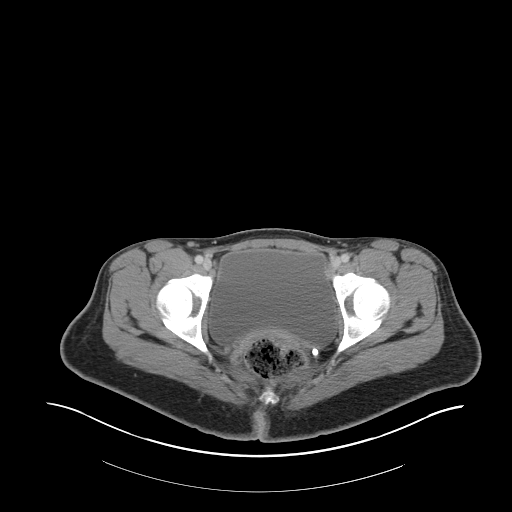
[im 25/95  soft-tissue]
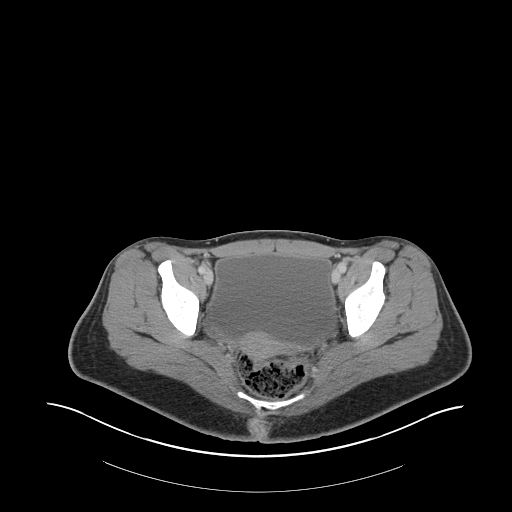
[im 35/95  soft-tissue]
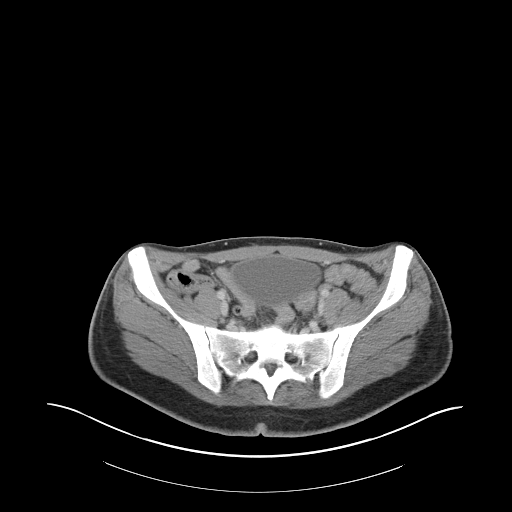
[im 40/95  soft-tissue]
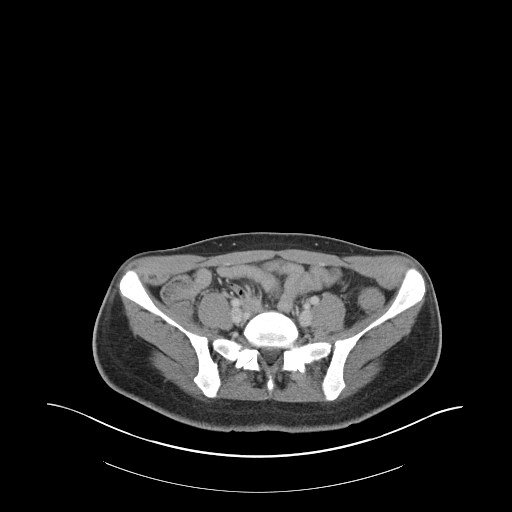
[im 50/95  soft-tissue]
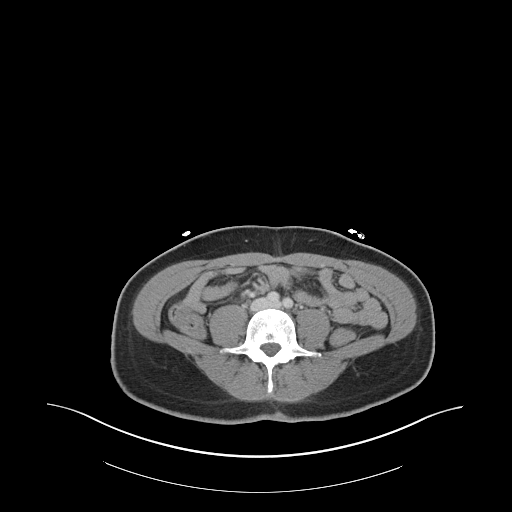
[im 55/95  soft-tissue]
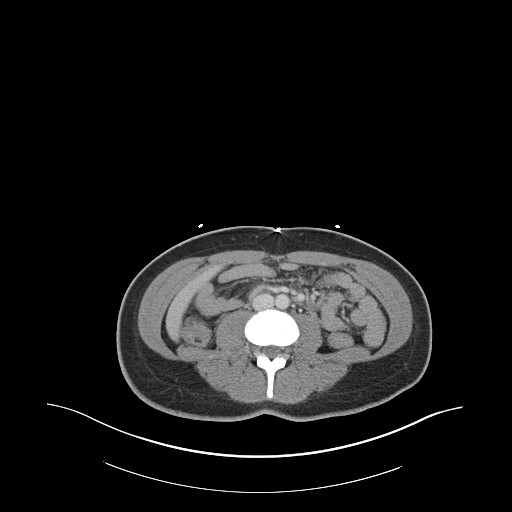
[im 60/95  soft-tissue]
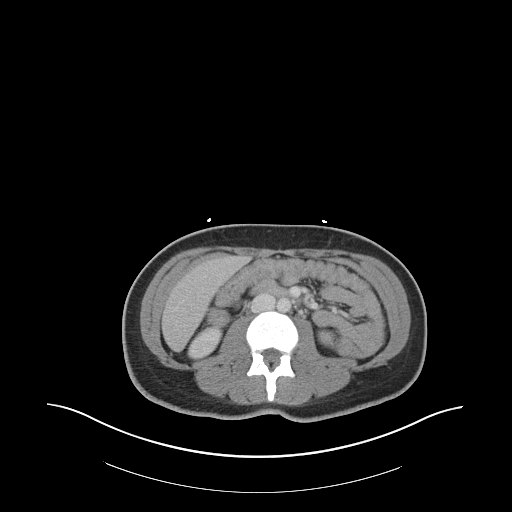
[im 60/95  bone]
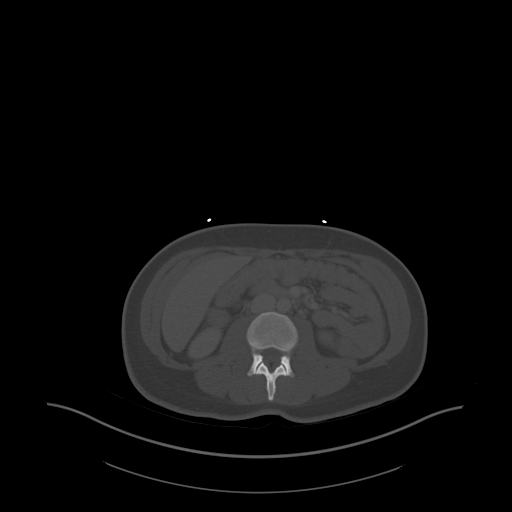
[im 70/95  soft-tissue]
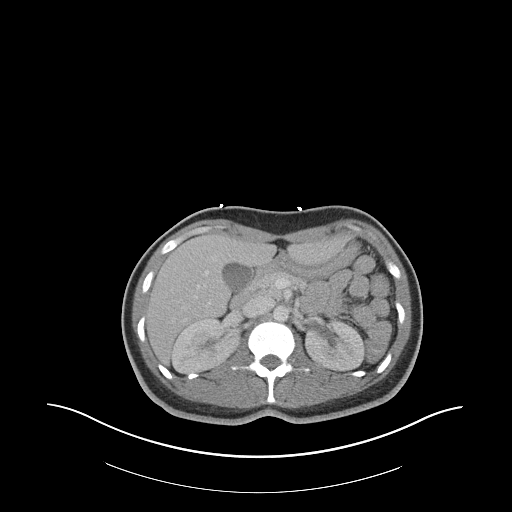
[im 75/95  soft-tissue]
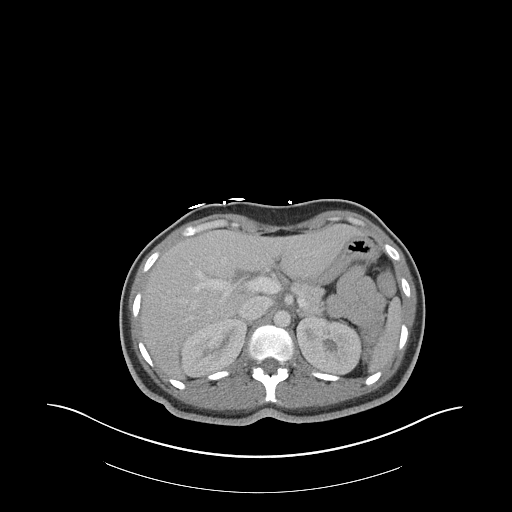
[im 80/95  soft-tissue]
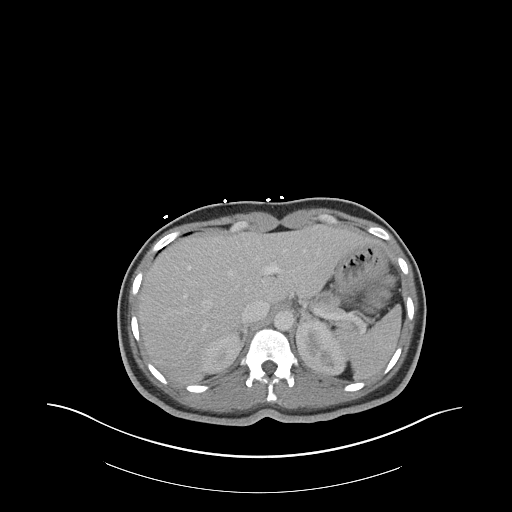
[im 90/95  soft-tissue]
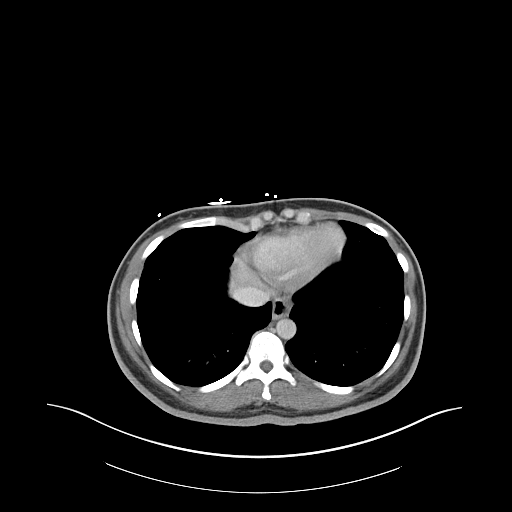

[Series 5: coronal st · coronal · 0.77mm/px · 3 of 139 slices shown]
[im 47/139  soft-tissue]
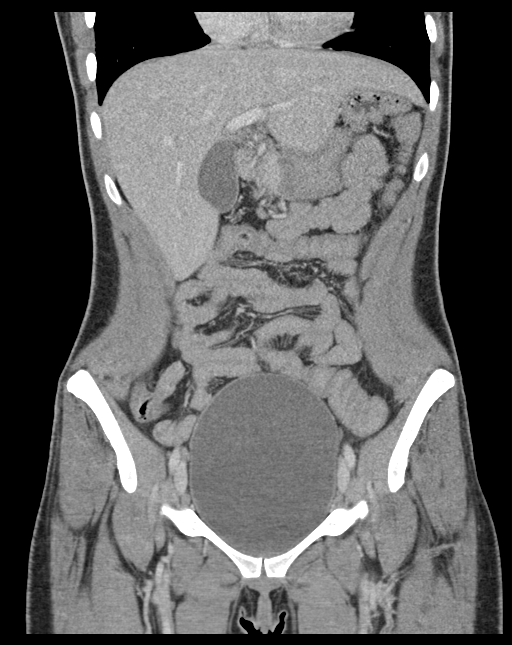
[im 62/139  soft-tissue]
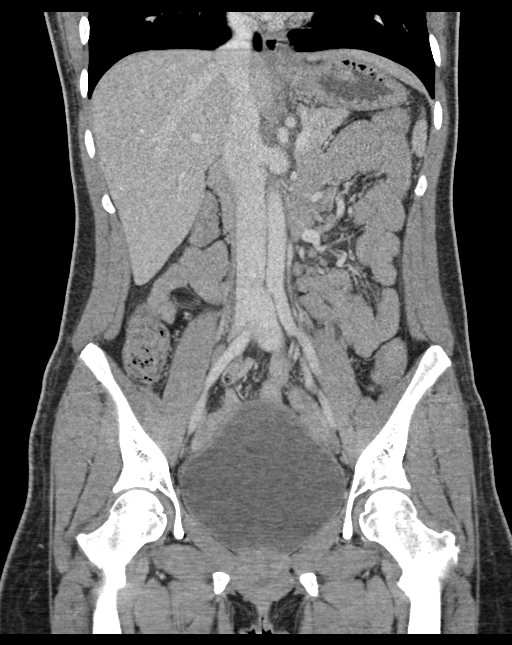
[im 77/139  soft-tissue]
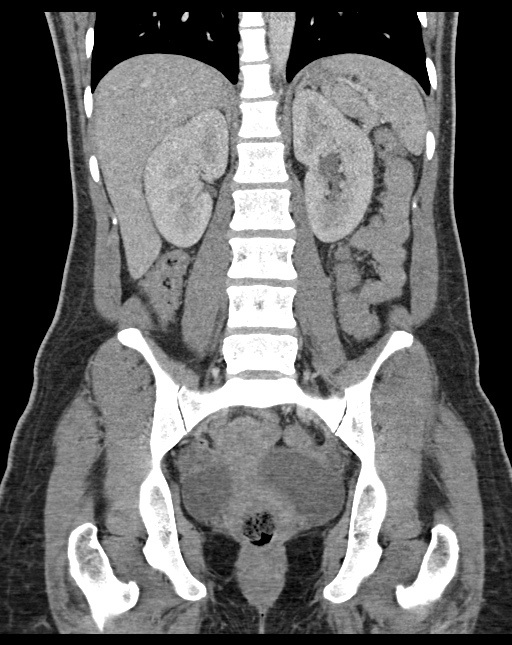

[16 of 46 positions shown; findings below may reference images not displayed]

FINDINGS: Lower chest: No acute abnormality

Hepatobiliary: No focal hepatic abnormality. Gallbladder
unremarkable.

Pancreas: No focal abnormality or ductal dilatation.

Spleen: No focal abnormality.  Normal size.

Adrenals/Urinary Tract: No adrenal abnormality. No focal renal
abnormality. No stones or hydronephrosis. Urinary bladder is
unremarkable.

Stomach/Bowel: Normal appendix. Stomach, large and small bowel
grossly unremarkable.

Vascular/Lymphatic: No evidence of aneurysm or adenopathy.

Reproductive: Uterus and adnexa unremarkable.  No mass.

Other: No free fluid or free air.

Musculoskeletal: No acute bony abnormality.
IMPRESSION: No acute findings in the abdomen or pelvis.

## 2022-01-15 NOTE — Telephone Encounter (Signed)
Transition Care Management Follow-up Telephone Call Date of discharge and from where: 01/05/22 [facility] How have you been since you were released from the hospital? I'm doing ok. I am going to [place] on Friday to see a doctor about having a GI pacemaker put in. Any questions or concerns? No  Items Reviewed: Did the pt receive and understand the discharge instructions provided? No  Medications obtained and verified? No  Other? No  Any new allergies since your discharge? No  Dietary orders reviewed? Yes Do you have support at home? Yes   Home Care and Equipment/Supplies: Were home health services ordered? not applicable If so, what is the name of the agency? N/a  Has the agency set up a time to come to the patient's home? not applicable Were any new equipment or medical supplies ordered?  No What is the name of the medical supply agency? N/a Were you able to get the supplies/equipment? not applicable Do you have any questions related to the use of the equipment or supplies? No  Functional Questionnaire: (I = Independent and D = Dependent) ADLs: I  Bathing/Dressing- I  Meal Prep- I  Eating- I  Maintaining continence- I  Transferring/Ambulation- I  Managing Meds- I  Follow up appointments reviewed:  PCP Hospital f/u appt confirmed? No  Scheduled to see n/a on n/a @ n/a. Pt does not want to schedule an appointment. Specialist Hospital f/u appt confirmed? No  Scheduled to see n/a on n/a @ n/a. Are transportation arrangements needed? No  If their condition worsens, is the pt aware to call PCP or go to the Emergency Dept.? Yes Was the patient provided with contact information for the PCP's office or ED? Yes Was to pt encouraged to call back with questions or concerns? Yes  Arvil Persons, RN, BSN RN Clinical Supervisor [facility] [employer]

## 2022-01-17 DIAGNOSIS — Z289 Immunization not carried out for unspecified reason: Secondary | ICD-10-CM | POA: Diagnosis not present

## 2022-01-17 DIAGNOSIS — K3184 Gastroparesis: Secondary | ICD-10-CM | POA: Diagnosis not present

## 2022-01-17 DIAGNOSIS — E1143 Type 2 diabetes mellitus with diabetic autonomic (poly)neuropathy: Secondary | ICD-10-CM | POA: Diagnosis not present

## 2022-01-22 DIAGNOSIS — K449 Diaphragmatic hernia without obstruction or gangrene: Secondary | ICD-10-CM | POA: Diagnosis not present

## 2022-01-22 DIAGNOSIS — R112 Nausea with vomiting, unspecified: Secondary | ICD-10-CM | POA: Diagnosis not present

## 2022-01-22 DIAGNOSIS — K5790 Diverticulosis of intestine, part unspecified, without perforation or abscess without bleeding: Secondary | ICD-10-CM | POA: Diagnosis not present

## 2022-01-22 DIAGNOSIS — E1043 Type 1 diabetes mellitus with diabetic autonomic (poly)neuropathy: Secondary | ICD-10-CM | POA: Diagnosis not present

## 2022-01-22 DIAGNOSIS — K5900 Constipation, unspecified: Secondary | ICD-10-CM | POA: Diagnosis not present

## 2022-01-22 DIAGNOSIS — E1065 Type 1 diabetes mellitus with hyperglycemia: Secondary | ICD-10-CM | POA: Diagnosis not present

## 2022-01-22 DIAGNOSIS — K8020 Calculus of gallbladder without cholecystitis without obstruction: Secondary | ICD-10-CM | POA: Diagnosis not present

## 2022-01-23 DIAGNOSIS — K5790 Diverticulosis of intestine, part unspecified, without perforation or abscess without bleeding: Secondary | ICD-10-CM | POA: Diagnosis not present

## 2022-01-23 DIAGNOSIS — Z79899 Other long term (current) drug therapy: Secondary | ICD-10-CM | POA: Diagnosis not present

## 2022-01-23 DIAGNOSIS — Z7951 Long term (current) use of inhaled steroids: Secondary | ICD-10-CM | POA: Diagnosis not present

## 2022-01-23 DIAGNOSIS — R112 Nausea with vomiting, unspecified: Secondary | ICD-10-CM | POA: Diagnosis not present

## 2022-01-23 DIAGNOSIS — E1065 Type 1 diabetes mellitus with hyperglycemia: Secondary | ICD-10-CM | POA: Diagnosis not present

## 2022-01-23 DIAGNOSIS — K219 Gastro-esophageal reflux disease without esophagitis: Secondary | ICD-10-CM | POA: Diagnosis not present

## 2022-01-23 DIAGNOSIS — R69 Illness, unspecified: Secondary | ICD-10-CM | POA: Diagnosis not present

## 2022-01-23 DIAGNOSIS — K3184 Gastroparesis: Secondary | ICD-10-CM | POA: Diagnosis not present

## 2022-01-23 DIAGNOSIS — E785 Hyperlipidemia, unspecified: Secondary | ICD-10-CM | POA: Diagnosis not present

## 2022-01-23 DIAGNOSIS — R109 Unspecified abdominal pain: Secondary | ICD-10-CM | POA: Diagnosis not present

## 2022-01-23 DIAGNOSIS — E860 Dehydration: Secondary | ICD-10-CM | POA: Diagnosis not present

## 2022-01-23 DIAGNOSIS — K449 Diaphragmatic hernia without obstruction or gangrene: Secondary | ICD-10-CM | POA: Diagnosis not present

## 2022-01-23 DIAGNOSIS — Z794 Long term (current) use of insulin: Secondary | ICD-10-CM | POA: Diagnosis not present

## 2022-01-23 DIAGNOSIS — E1043 Type 1 diabetes mellitus with diabetic autonomic (poly)neuropathy: Secondary | ICD-10-CM | POA: Diagnosis not present

## 2022-01-23 DIAGNOSIS — K5900 Constipation, unspecified: Secondary | ICD-10-CM | POA: Diagnosis not present

## 2022-01-23 DIAGNOSIS — K8020 Calculus of gallbladder without cholecystitis without obstruction: Secondary | ICD-10-CM | POA: Diagnosis not present

## 2022-01-27 ENCOUNTER — Telehealth: Payer: Self-pay

## 2022-01-27 ENCOUNTER — Other Ambulatory Visit: Payer: Self-pay

## 2022-01-27 MED ORDER — DEXCOM G6 SENSOR MISC: 0 refills | Status: DC

## 2022-01-27 NOTE — Telephone Encounter (Signed)
Transition Care Management Follow-up Telephone Call Date of discharge and from where:  How have you been since you were released from the hospital? Doing better  Any questions or concerns? No  Items Reviewed: Did the pt receive and understand the discharge instructions provided? Yes  Medications obtained and verified? Yes  Other? No  Any new allergies since your discharge? No  Dietary orders reviewed? Yes Do you have support at home? Yes   Home Care and Equipment/Supplies: Were home health services ordered? no If so, what is the name of the agency? na  Has the agency set up a time to come to the patient's home? not applicable Were any new equipment or medical supplies ordered?  No What is the name of the medical supply agency? na Were you able to get the supplies/equipment? not applicable Do you have any questions related to the use of the equipment or supplies? No  Functional Questionnaire: (I = Independent and D = Dependent) ADLs: I  Bathing/Dressing- I  Meal Prep- I  Eating- I  Maintaining continence- I  Transferring/Ambulation- I  Managing Meds- I  Follow up appointments reviewed:  PCP Hospital f/u appt confirmed? Yes  [facility]? No . Are transportation arrangements needed? No  If their condition worsens, is the pt aware to call PCP or go to the Emergency Dept.? Yes Was the patient provided with contact information for the PCP's office or ED? Yes Was to pt encouraged to call back with questions or concerns? Yes   Woodfin Ganja LPN [facility] Nurse Health Advisor Direct Dial [phone]

## 2022-02-03 ENCOUNTER — Inpatient Hospital Stay: Payer: 59 | Admitting: Family Medicine

## 2022-02-03 ENCOUNTER — Telehealth: Payer: Self-pay | Admitting: Family Medicine

## 2022-02-03 NOTE — Telephone Encounter (Signed)
No show history 06/20/2019 No show/same day cancel New Pt 02/07/2021 no show  08/12/2021 no show 10/04/2021 no show but pt was admitted to hospital 11/05/2021 no show but pt was admitted to hospital 02/03/2022 no show  Please advise on how you would like me to proceed.

## 2022-02-03 NOTE — Telephone Encounter (Signed)
Pt was a no show 12/18 for Hospital FU with Dr. Veto Kemps. This would be 3rd

## 2022-02-04 ENCOUNTER — Encounter: Payer: Self-pay | Admitting: Family Medicine

## 2022-02-04 NOTE — Telephone Encounter (Signed)
I do not see prior "final warning" letter. Sending final warning via mail and mychart.  Notified that next late arrival, late cancellation, or no show will be dismissal.

## 2022-02-13 NOTE — Progress Notes (Shared)
[facility] - Clinic Note  02/25/2022     CHIEF COMPLAINT Patient presents for No chief complaint on file.   HISTORY OF PRESENT ILLNESS: Kelli Hudson is a 31 y.o. female who presents to the clinic today for:     Referring physician: Loyola Mast, MD [address] [place],  [place] [address]  HISTORICAL INFORMATION:   Selected notes from the MEDICAL RECORD NUMBER Referred by Dr. Nedra Hai:  Ocular Hx- PMH-    CURRENT MEDICATIONS: No current outpatient medications on file. (Ophthalmic Drugs)   No current facility-administered medications for this visit. (Ophthalmic Drugs)   Current Outpatient Medications (Other)  Medication Sig   atorvastatin (LIPITOR) 20 MG tablet Take 1 tablet (20 mg total) by mouth daily.   carvedilol (COREG) 12.5 MG tablet Take 0.5 tablets (6.25 mg total) by mouth 2 (two) times daily with a meal.   Continuous Blood Gluc Sensor (DEXCOM G6 SENSOR) MISC Place 1 sensor into the skin every 10 days   Continuous Blood Gluc Transmit (DEXCOM G6 TRANSMITTER) MISC Change every 90 days   dicyclomine (BENTYL) 20 MG tablet Take 1 tablet (20 mg total) by mouth 3 (three) times daily as needed for spasms. (Patient not taking: Reported on 12/25/2021)   gabapentin (NEURONTIN) 300 MG capsule Take 1 capsule (300 mg total) by mouth 3 (three) times daily.   hydrOXYzine (VISTARIL) 25 MG capsule Take 1 capsule (25 mg total) by mouth every 8 (eight) hours as needed. (Patient taking differently: Take 25 mg by mouth every 8 (eight) hours as needed for anxiety.)   insulin detemir (LEVEMIR FLEXTOUCH) 100 UNIT/ML FlexPen Inject 16 Units into the skin daily. (Patient taking differently: Inject 16 Units into the skin daily before breakfast.)   insulin regular (NOVOLIN R) 100 units/mL injection Inject 0-0.15 mLs (0-15 Units total) into the skin See admin instructions. Via pump.Start this medication after you follow up with your PCP (Patient taking  differently: Inject 0-15 Units into the skin See admin instructions. Inject 0-15 units into the skin three times a day with meals, per sliding scale)   metoCLOPramide (REGLAN) 10 MG tablet Take 1 tablet (10 mg total) by mouth every 8 (eight) hours as needed for nausea or vomiting. (Patient taking differently: Take 10 mg by mouth 3 (three) times daily before meals.)   ondansetron (ZOFRAN) 4 MG tablet Take 4 mg by mouth every 8 (eight) hours as needed for nausea or vomiting.   pantoprazole (PROTONIX) 40 MG tablet Take 1 tablet (40 mg total) by mouth 2 (two) times daily before a meal.   No current facility-administered medications for this visit. (Other)      REVIEW OF SYSTEMS:    ALLERGIES No Known Allergies  PAST MEDICAL HISTORY Past Medical History:  Diagnosis Date   Acute H. pylori gastric ulcer    Coffee ground emesis    Diabetes mellitus (HCC)    Diabetic gastroparesis (HCC)    DKA (diabetic ketoacidosis) (HCC) 02/24/2021   Gastroparesis    GERD (gastroesophageal reflux disease)    Hypertension    Hyperthyroidism    Intractable nausea and vomiting 04/20/2021   Normocytic anemia 04/20/2020   Prolonged Q-T interval on ECG    Past Surgical History:  Procedure Laterality Date   AMPUTATION TOE Left 03/10/2018   Procedure: AMPUTATION FIFTH TOE;  Surgeon: Vivi Barrack, DPM;  Location: [facility];  Service: Podiatry;  Laterality: Left;   BIOPSY  01/28/2020   Procedure: BIOPSY;  Surgeon:  Kathi Der, MD;  Location: [facility];  Service: Gastroenterology;;   BOTOX INJECTION  08/26/2021   Procedure: BOTOX INJECTION;  Surgeon: Sherrilyn Rist, MD;  Location: [facility];  Service: Gastroenterology;;   ESOPHAGOGASTRODUODENOSCOPY N/A 01/28/2020   Procedure: ESOPHAGOGASTRODUODENOSCOPY (EGD);  Surgeon: Kathi Der, MD;  Location: [facility];  Service: Gastroenterology;  Laterality: N/A;   ESOPHAGOGASTRODUODENOSCOPY (EGD) WITH PROPOFOL Left  09/08/2015   Procedure: ESOPHAGOGASTRODUODENOSCOPY (EGD) WITH PROPOFOL;  Surgeon: Willis Modena, MD;  Location: [facility];  Service: Endoscopy;  Laterality: Left;   ESOPHAGOGASTRODUODENOSCOPY (EGD) WITH PROPOFOL N/A 08/26/2021   Procedure: ESOPHAGOGASTRODUODENOSCOPY (EGD) WITH PROPOFOL;  Surgeon: Sherrilyn Rist, MD;  Location: [facility];  Service: Gastroenterology;  Laterality: N/A;   WISDOM TOOTH EXTRACTION      FAMILY HISTORY Family History  Problem Relation Age of Onset   Lung cancer Mother    Diabetes Mother    Bipolar disorder Father    Liver cancer Maternal Grandfather    Diabetes Maternal Aunt        x 2   Pancreatic cancer Maternal Uncle    Prostate cancer Maternal Uncle    Breast cancer Other        maternal great aunt   Heart disease Other    Ovarian cancer Other        maternal great aunt   Kidney disease Other        maternal great aunt   Colon cancer Neg Hx    Stomach cancer Neg Hx     SOCIAL HISTORY Social History   Tobacco Use   Smoking status: Never   Smokeless tobacco: Never  Vaping Use   Vaping Use: Never used  Substance Use Topics   Alcohol use: Yes    Alcohol/week: 1.0 standard drink of alcohol    Types: 1 Shots of liquor per week    Comment: occasional    Drug use: No         OPHTHALMIC EXAM:  Not recorded     IMAGING AND PROCEDURES  Imaging and Procedures for 02/25/2022           ASSESSMENT/PLAN:  No diagnosis found.  1.  2.  3.  Ophthalmic Meds Ordered this visit:  No orders of the defined types were placed in this encounter.      No follow-ups on file.  There are no Patient Instructions on file for this visit.   Explained the diagnoses, plan, and follow up with the patient and they expressed understanding.  Patient expressed understanding of the importance of proper follow up care.   This document serves as a record of services personally performed by Karie Chimera, MD, PhD. It was created on their  behalf by Gerilyn Nestle, COT an ophthalmic technician. The creation of this record is the provider's dictation and/or activities during the visit.    Electronically signed by:  Gerilyn Nestle, COT  12.28.23 8:19 AM   Karie Chimera, M.D., Ph.D. Diseases & Surgery of the Retina and Vitreous [facility] @TODAY @     Abbreviations: M myopia (nearsighted); A astigmatism; H hyperopia (farsighted); P presbyopia; Mrx spectacle prescription;  CTL contact lenses; OD right eye; OS left eye; OU both eyes  XT exotropia; ET esotropia; PEK punctate epithelial keratitis; PEE punctate epithelial erosions; DES dry eye syndrome; MGD meibomian gland dysfunction; ATs artificial tears; PFAT's preservative free artificial tears; NSC nuclear sclerotic cataract; PSC posterior subcapsular cataract; ERM epi-retinal membrane; PVD posterior vitreous detachment; RD  retinal detachment; DM diabetes mellitus; DR diabetic retinopathy; NPDR non-proliferative diabetic retinopathy; PDR proliferative diabetic retinopathy; CSME clinically significant macular edema; DME diabetic macular edema; dbh dot blot hemorrhages; CWS cotton wool spot; POAG primary open angle glaucoma; C/D cup-to-disc ratio; HVF humphrey visual field; GVF goldmann visual field; OCT optical coherence tomography; IOP intraocular pressure; BRVO Branch retinal vein occlusion; CRVO central retinal vein occlusion; CRAO central retinal artery occlusion; BRAO branch retinal artery occlusion; RT retinal tear; SB scleral buckle; PPV pars plana vitrectomy; VH Vitreous hemorrhage; PRP panretinal laser photocoagulation; IVK intravitreal kenalog; VMT vitreomacular traction; MH Macular hole;  NVD neovascularization of the disc; NVE neovascularization elsewhere; AREDS age related eye disease study; ARMD age related macular degeneration; POAG primary open angle glaucoma; EBMD epithelial/anterior basement membrane dystrophy; ACIOL anterior chamber  intraocular lens; IOL intraocular lens; PCIOL posterior chamber intraocular lens; Phaco/IOL phacoemulsification with intraocular lens placement; PRK photorefractive keratectomy; LASIK laser assisted in situ keratomileusis; HTN hypertension; DM diabetes mellitus; COPD chronic obstructive pulmonary disease

## 2022-02-16 ENCOUNTER — Encounter (HOSPITAL_COMMUNITY): Payer: Self-pay | Admitting: Emergency Medicine

## 2022-02-16 ENCOUNTER — Other Ambulatory Visit: Payer: Self-pay

## 2022-02-16 ENCOUNTER — Observation Stay (HOSPITAL_COMMUNITY)
Admission: EM | Admit: 2022-02-16 | Discharge: 2022-02-17 | Disposition: A | Discharge status: Home / Self Care | Payer: Medicaid Other | Attending: Internal Medicine | Admitting: Internal Medicine

## 2022-02-16 DIAGNOSIS — Z7984 Long term (current) use of oral hypoglycemic drugs: Secondary | ICD-10-CM | POA: Diagnosis not present

## 2022-02-16 DIAGNOSIS — E109 Type 1 diabetes mellitus without complications: Secondary | ICD-10-CM | POA: Diagnosis present

## 2022-02-16 DIAGNOSIS — Z794 Long term (current) use of insulin: Secondary | ICD-10-CM | POA: Insufficient documentation

## 2022-02-16 DIAGNOSIS — D649 Anemia, unspecified: Secondary | ICD-10-CM | POA: Diagnosis present

## 2022-02-16 DIAGNOSIS — Z79899 Other long term (current) drug therapy: Secondary | ICD-10-CM | POA: Insufficient documentation

## 2022-02-16 DIAGNOSIS — R112 Nausea with vomiting, unspecified: Secondary | ICD-10-CM | POA: Diagnosis present

## 2022-02-16 DIAGNOSIS — R000 Tachycardia, unspecified: Secondary | ICD-10-CM | POA: Insufficient documentation

## 2022-02-16 DIAGNOSIS — K3184 Gastroparesis: Secondary | ICD-10-CM

## 2022-02-16 DIAGNOSIS — E0865 Diabetes mellitus due to underlying condition with hyperglycemia: Secondary | ICD-10-CM

## 2022-02-16 DIAGNOSIS — E1143 Type 2 diabetes mellitus with diabetic autonomic (poly)neuropathy: Secondary | ICD-10-CM

## 2022-02-16 DIAGNOSIS — E785 Hyperlipidemia, unspecified: Secondary | ICD-10-CM | POA: Insufficient documentation

## 2022-02-16 DIAGNOSIS — Z89422 Acquired absence of other left toe(s): Secondary | ICD-10-CM | POA: Insufficient documentation

## 2022-02-16 DIAGNOSIS — E1043 Type 1 diabetes mellitus with diabetic autonomic (poly)neuropathy: Principal | ICD-10-CM

## 2022-02-16 DIAGNOSIS — E039 Hypothyroidism, unspecified: Secondary | ICD-10-CM | POA: Insufficient documentation

## 2022-02-16 DIAGNOSIS — I10 Essential (primary) hypertension: Secondary | ICD-10-CM | POA: Diagnosis not present

## 2022-02-16 DIAGNOSIS — E1065 Type 1 diabetes mellitus with hyperglycemia: Secondary | ICD-10-CM | POA: Insufficient documentation

## 2022-02-16 DIAGNOSIS — E1042 Type 1 diabetes mellitus with diabetic polyneuropathy: Secondary | ICD-10-CM | POA: Insufficient documentation

## 2022-02-16 DIAGNOSIS — K219 Gastro-esophageal reflux disease without esophagitis: Secondary | ICD-10-CM | POA: Diagnosis present

## 2022-02-16 DIAGNOSIS — D75838 Other thrombocytosis: Secondary | ICD-10-CM | POA: Diagnosis not present

## 2022-02-16 LAB — URINALYSIS, ROUTINE W REFLEX MICROSCOPIC
Bilirubin Urine: NEGATIVE
Glucose, UA: >=500 mg/dL — AB
Ketones, ur: 20 mg/dL — AB
Leukocytes,Ua: NEGATIVE
Nitrite: NEGATIVE
Protein, ur: >=300 mg/dL — AB
Specific Gravity, Urine: 1.012 (ref 1.005–1.030)
pH: 5 (ref 5.0–8.0)

## 2022-02-16 LAB — CBC
HCT: 31.4 % — ABNORMAL LOW (ref 36.0–46.0)
Hemoglobin: 10.4 g/dL — ABNORMAL LOW (ref 12.0–15.0)
MCH: 28.9 pg (ref 26.0–34.0)
MCHC: 33.1 g/dL (ref 30.0–36.0)
MCV: 87.2 fL (ref 80.0–100.0)
Platelets: 407 10*3/uL — ABNORMAL HIGH (ref 150–400)
RBC: 3.6 MIL/uL — ABNORMAL LOW (ref 3.87–5.11)
RDW: 12.8 % (ref 11.5–15.5)
WBC: 8 10*3/uL (ref 4.0–10.5)
nRBC: 0 % (ref 0.0–0.2)

## 2022-02-16 LAB — COMPREHENSIVE METABOLIC PANEL
ALT: 14 U/L (ref 0–44)
AST: 15 U/L (ref 15–41)
Albumin: 2.7 g/dL — ABNORMAL LOW (ref 3.5–5.0)
Alkaline Phosphatase: 76 U/L (ref 38–126)
Anion gap: 10 (ref 5–15)
BUN: 16 mg/dL (ref 6–20)
CO2: 24 mmol/L (ref 22–32)
Calcium: 9 mg/dL (ref 8.9–10.3)
Chloride: 102 mmol/L (ref 98–111)
Creatinine, Ser: 1.19 mg/dL — ABNORMAL HIGH (ref 0.44–1.00)
GFR, Estimated: >60 mL/min (ref >60)
Glucose, Bld: 300 mg/dL — ABNORMAL HIGH (ref 70–99)
Potassium: 3.7 mmol/L (ref 3.5–5.1)
Sodium: 136 mmol/L (ref 135–145)
Total Bilirubin: 1.2 mg/dL (ref 0.3–1.2)
Total Protein: 6 g/dL — ABNORMAL LOW (ref 6.5–8.1)

## 2022-02-16 LAB — BLOOD GAS, VENOUS
Acid-Base Excess: 4.4 mmol/L — ABNORMAL HIGH (ref 0.0–2.0)
Bicarbonate: 29.2 mmol/L — ABNORMAL HIGH (ref 20.0–28.0)
O2 Saturation: 80.1 %
Patient temperature: 37
pCO2, Ven: 43 mmHg — ABNORMAL LOW (ref 44–60)
pH, Ven: 7.44 — ABNORMAL HIGH (ref 7.25–7.43)
pO2, Ven: 48 mmHg — ABNORMAL HIGH (ref 32–45)

## 2022-02-16 LAB — BETA-HYDROXYBUTYRIC ACID: Beta-Hydroxybutyric Acid: 1.87 mmol/L — ABNORMAL HIGH (ref 0.05–0.27)

## 2022-02-16 LAB — CBG MONITORING, ED: Glucose-Capillary: 271 mg/dL — ABNORMAL HIGH (ref 70–99)

## 2022-02-16 LAB — PREGNANCY, URINE: Preg Test, Ur: NEGATIVE

## 2022-02-16 LAB — LIPASE, BLOOD: Lipase: 34 U/L (ref 11–51)

## 2022-02-16 LAB — GLUCOSE, CAPILLARY
Glucose-Capillary: 262 mg/dL — ABNORMAL HIGH (ref 70–99)
Glucose-Capillary: 194 mg/dL — ABNORMAL HIGH (ref 70–99)

## 2022-02-16 MED ORDER — GABAPENTIN 300 MG PO CAPS
300.0000 mg | ORAL_CAPSULE | Freq: Three times a day (TID) | ORAL | Status: DC
  Administered 2022-02-16 – 2022-02-17 (×2): 300 mg via ORAL
  Filled 2022-02-16 (×2): qty 1

## 2022-02-16 MED ORDER — METOPROLOL TARTRATE 5 MG/5ML IV SOLN: 2.5000 mg | Freq: Four times a day (QID) | INTRAVENOUS | Status: DC

## 2022-02-16 MED ORDER — MAGNESIUM SULFATE 2 GM/50ML IV SOLN
2.0000 g | Freq: Once | INTRAVENOUS | Status: AC
  Administered 2022-02-16: 2 g via INTRAVENOUS
  Filled 2022-02-16: qty 50

## 2022-02-16 MED ORDER — METOPROLOL TARTRATE 5 MG/5ML IV SOLN
5.0000 mg | Freq: Three times a day (TID) | INTRAVENOUS | Status: DC
  Administered 2022-02-16: 5 mg via INTRAVENOUS
  Filled 2022-02-16: qty 5

## 2022-02-16 MED ORDER — LACTATED RINGERS IV BOLUS
1000.0000 mL | Freq: Once | INTRAVENOUS | Status: AC
  Administered 2022-02-16: 1000 mL via INTRAVENOUS

## 2022-02-16 MED ORDER — ACETAMINOPHEN 325 MG PO TABS: 650.0000 mg | ORAL_TABLET | Freq: Four times a day (QID) | ORAL | Status: DC | PRN

## 2022-02-16 MED ORDER — HYDROMORPHONE HCL 1 MG/ML IJ SOLN
1.0000 mg | INTRAMUSCULAR | Status: DC | PRN
  Administered 2022-02-16 – 2022-02-17 (×5): 1 mg via INTRAVENOUS
  Filled 2022-02-16 (×5): qty 1

## 2022-02-16 MED ORDER — ONDANSETRON HCL 4 MG/2ML IJ SOLN
4.0000 mg | Freq: Four times a day (QID) | INTRAMUSCULAR | Status: DC | PRN
  Filled 2022-02-16: qty 2

## 2022-02-16 MED ORDER — CARVEDILOL 6.25 MG PO TABS
6.2500 mg | ORAL_TABLET | Freq: Two times a day (BID) | ORAL | Status: DC
  Administered 2022-02-17: 6.25 mg via ORAL
  Filled 2022-02-16: qty 1

## 2022-02-16 MED ORDER — LORAZEPAM 2 MG/ML IJ SOLN
1.0000 mg | Freq: Once | INTRAMUSCULAR | Status: AC
  Administered 2022-02-16: 1 mg via INTRAVENOUS
  Filled 2022-02-16: qty 1

## 2022-02-16 MED ORDER — INSULIN DETEMIR 100 UNIT/ML ~~LOC~~ SOLN
16.0000 [IU] | Freq: Every day | SUBCUTANEOUS | Status: DC
  Administered 2022-02-17: 16 [IU] via SUBCUTANEOUS
  Filled 2022-02-16 (×2): qty 0.16

## 2022-02-16 MED ORDER — SODIUM CHLORIDE 0.9 % IV BOLUS
1000.0000 mL | Freq: Once | INTRAVENOUS | Status: AC
  Administered 2022-02-16: 1000 mL via INTRAVENOUS

## 2022-02-16 MED ORDER — POTASSIUM CHLORIDE IN NACL 20-0.9 MEQ/L-% IV SOLN
INTRAVENOUS | Status: AC
  Filled 2022-02-16 (×3): qty 1000

## 2022-02-16 MED ORDER — METOCLOPRAMIDE HCL 5 MG/ML IJ SOLN
10.0000 mg | Freq: Once | INTRAMUSCULAR | Status: AC
  Administered 2022-02-16: 10 mg via INTRAVENOUS
  Filled 2022-02-16: qty 2

## 2022-02-16 MED ORDER — HYDROMORPHONE HCL 1 MG/ML IJ SOLN
1.0000 mg | Freq: Once | INTRAMUSCULAR | Status: AC
  Administered 2022-02-16: 1 mg via INTRAVENOUS
  Filled 2022-02-16: qty 1

## 2022-02-16 MED ORDER — LACTATED RINGERS IV BOLUS
1000.0000 mL | Freq: Once | INTRAVENOUS | Status: DC
  Administered 2022-02-16: 1000 mL via INTRAVENOUS

## 2022-02-16 MED ORDER — ATORVASTATIN CALCIUM 20 MG PO TABS
20.0000 mg | ORAL_TABLET | Freq: Every day | ORAL | Status: DC
  Administered 2022-02-16 – 2022-02-17 (×2): 20 mg via ORAL
  Filled 2022-02-16 (×2): qty 1

## 2022-02-16 MED ORDER — ACETAMINOPHEN 650 MG RE SUPP
650.0000 mg | Freq: Four times a day (QID) | RECTAL | Status: DC | PRN
  Filled 2022-02-16: qty 1

## 2022-02-16 MED ORDER — INSULIN ASPART 100 UNIT/ML IJ SOLN
0.0000 [IU] | Freq: Four times a day (QID) | INTRAMUSCULAR | Status: DC
  Administered 2022-02-16 – 2022-02-17 (×2): 2 [IU] via SUBCUTANEOUS
  Administered 2022-02-17: 3 [IU] via SUBCUTANEOUS
  Administered 2022-02-17: 2 [IU] via SUBCUTANEOUS

## 2022-02-16 MED ORDER — ONDANSETRON HCL 4 MG PO TABS: 4.0000 mg | ORAL_TABLET | Freq: Four times a day (QID) | ORAL | Status: DC | PRN

## 2022-02-16 MED ORDER — PANTOPRAZOLE SODIUM 40 MG IV SOLR
40.0000 mg | Freq: Every day | INTRAVENOUS | Status: DC
  Administered 2022-02-16: 40 mg via INTRAVENOUS
  Filled 2022-02-16: qty 10

## 2022-02-16 MED ORDER — METOCLOPRAMIDE HCL 5 MG/ML IJ SOLN
10.0000 mg | Freq: Four times a day (QID) | INTRAMUSCULAR | Status: DC
  Administered 2022-02-16 – 2022-02-17 (×3): 10 mg via INTRAVENOUS
  Filled 2022-02-16 (×3): qty 2

## 2022-02-16 NOTE — ED Triage Notes (Signed)
Pt has gastroparesis and states she was recently admitted for such, upon discharge she was told to follow up with her primary. Primary office is closed for the holiday and pt still in severe pain with n/v.

## 2022-02-16 NOTE — H&P (Signed)
History and Physical    Patient: Kelli Hudson IHK:[id number] DOB: August 09, 1990 DOA: 02/16/2022 DOS: the patient was seen and examined on 02/16/2022 PCP: Loyola Mast, MD  Patient coming from: Home  Chief Complaint:  Chief Complaint  Patient presents with   Abdominal Pain   HPI: Kelli Hudson is a 31 y.o. female with medical history significant of H. pylori gastric ulcer, coffee-ground emesis, type 2 diabetes, diabetic gastroparesis, DKA, GERD, hypertension, hypothyroidism, normocytic anemia, prolonged QT interval who is coming to the emergency department with a history of abdominal pain, numerous episodes of nausea and emesis since Wednesday typical of her gastroparesis exacerbation.  No melena, hematochezia or constipation.  No fever, chills or night sweats. No sore throat, rhinorrhea, dyspnea, wheezing or hemoptysis.  No chest pain, palpitations, diaphoresis, PND, orthopnea or pitting edema of the lower extremities.  No flank pain, dysuria, frequency or hematuria.  No polyuria, polydipsia, polyphagia or blurred vision.  ED course: Initial vital signs were temperature 99.1 F, pulse 128, respiration 18, BP 167/112 mmHg and O2 sat 99% on room air.  Patient received hydromorphone 1 mg IVP, LR 1000 mL bolus, lorazepam 1 mg IVP and metoclopramide 10 mg IVP.  I added 1000 mL bolus.  Lab work: Urinalysis was hazy with glucosuria more than 500, ketonuria 20 and proteinuria more than 300 mg/dL.  Beta hydroxybutyric acid was 1.87 mmol/L.  Lipase was normal.  CBC showed a white count of 8.0, hemoglobin 10.4 and platelets 407.  Venous blood gas showed a pH of 7.44, pCO2 of 43 and pO2 of 48 mmHg.  Bicarbonate was 29.2 and acid-base excess 4.4 mmol/L.  CMP showed a glucose of 300, BUN 16 and creatinine 1.19 mg/dL.  Total protein 6.0 and albumin 2.7 g/dL.   Review of Systems: As mentioned in the history of present illness. All other systems reviewed and are negative. Past Medical History:   Diagnosis Date   Acute H. pylori gastric ulcer    Coffee ground emesis    Diabetes mellitus (HCC)    Diabetic gastroparesis (HCC)    DKA (diabetic ketoacidosis) (HCC) 02/24/2021   Gastroparesis    GERD (gastroesophageal reflux disease)    Hypertension    Hyperthyroidism    Intractable nausea and vomiting 04/20/2021   Normocytic anemia 04/20/2020   Prolonged Q-T interval on ECG    Past Surgical History:  Procedure Laterality Date   AMPUTATION TOE Left 03/10/2018   Procedure: AMPUTATION FIFTH TOE;  Surgeon: Vivi Barrack, DPM;  Location: [facility];  Service: Podiatry;  Laterality: Left;   BIOPSY  01/28/2020   Procedure: BIOPSY;  Surgeon: Kathi Der, MD;  Location: [facility];  Service: Gastroenterology;;   BOTOX INJECTION  08/26/2021   Procedure: BOTOX INJECTION;  Surgeon: Sherrilyn Rist, MD;  Location: [facility];  Service: Gastroenterology;;   ESOPHAGOGASTRODUODENOSCOPY N/A 01/28/2020   Procedure: ESOPHAGOGASTRODUODENOSCOPY (EGD);  Surgeon: Kathi Der, MD;  Location: [facility];  Service: Gastroenterology;  Laterality: N/A;   ESOPHAGOGASTRODUODENOSCOPY (EGD) WITH PROPOFOL Left 09/08/2015   Procedure: ESOPHAGOGASTRODUODENOSCOPY (EGD) WITH PROPOFOL;  Surgeon: Willis Modena, MD;  Location: [facility];  Service: Endoscopy;  Laterality: Left;   ESOPHAGOGASTRODUODENOSCOPY (EGD) WITH PROPOFOL N/A 08/26/2021   Procedure: ESOPHAGOGASTRODUODENOSCOPY (EGD) WITH PROPOFOL;  Surgeon: Sherrilyn Rist, MD;  Location: [facility];  Service: Gastroenterology;  Laterality: N/A;   WISDOM TOOTH EXTRACTION     Social History:  reports that she has never smoked. She has never used smokeless tobacco. She reports current alcohol use  of about 1.0 standard drink of alcohol per week. She reports that she does not use drugs.  No Known Allergies  Family History  Problem Relation Age of Onset   Lung cancer Mother    Diabetes Mother    Bipolar disorder Father     Liver cancer Maternal Grandfather    Diabetes Maternal Aunt        x 2   Pancreatic cancer Maternal Uncle    Prostate cancer Maternal Uncle    Breast cancer Other        maternal great aunt   Heart disease Other    Ovarian cancer Other        maternal great aunt   Kidney disease Other        maternal great aunt   Colon cancer Neg Hx    Stomach cancer Neg Hx     Prior to Admission medications   Medication Sig Start Date End Date Taking? Authorizing Provider  atorvastatin (LIPITOR) 20 MG tablet Take 1 tablet (20 mg total) by mouth daily. 10/29/21   [name], MD  carvedilol (COREG) 12.5 MG tablet Take 0.5 tablets (6.25 mg total) by mouth 2 (two) times daily with a meal. 12/30/21   Barb Merino, MD  Continuous Blood Gluc Sensor (DEXCOM G6 SENSOR) MISC Place 1 sensor into the skin every 10 days 01/27/22   Shamleffer, Melanie Crazier, MD  Continuous Blood Gluc Transmit (DEXCOM G6 TRANSMITTER) MISC Change every 90 days 11/29/21   Shamleffer, Melanie Crazier, MD  dicyclomine (BENTYL) 20 MG tablet Take 1 tablet (20 mg total) by mouth 3 (three) times daily as needed for spasms. Patient not taking: Reported on 12/25/2021 11/08/21   Hosie Poisson, MD  gabapentin (NEURONTIN) 300 MG capsule Take 1 capsule (300 mg total) by mouth 3 (three) times daily. 12/30/21 03/30/22  Barb Merino, MD  hydrOXYzine (VISTARIL) 25 MG capsule Take 1 capsule (25 mg total) by mouth every 8 (eight) hours as needed. Patient taking differently: Take 25 mg by mouth every 8 (eight) hours as needed for anxiety. 07/31/21   Georgette Shell, MD  insulin detemir (LEVEMIR FLEXTOUCH) 100 UNIT/ML FlexPen Inject 16 Units into the skin daily. Patient taking differently: Inject 16 Units into the skin daily before breakfast. 06/14/21   Shelly Coss, MD  insulin regular (NOVOLIN R) 100 units/mL injection Inject 0-0.15 mLs (0-15 Units total) into the skin See admin instructions. Via pump.Start this medication after you  follow up with your PCP Patient taking differently: Inject 0-15 Units into the skin See admin instructions. Inject 0-15 units into the skin three times a day with meals, per sliding scale 06/14/21   Shelly Coss, MD  metoCLOPramide (REGLAN) 10 MG tablet Take 1 tablet (10 mg total) by mouth every 8 (eight) hours as needed for nausea or vomiting. Patient taking differently: Take 10 mg by mouth 3 (three) times daily before meals. 11/08/21   Hosie Poisson, MD  ondansetron (ZOFRAN) 4 MG tablet Take 4 mg by mouth every 8 (eight) hours as needed for nausea or vomiting.    [provider]  pantoprazole (PROTONIX) 40 MG tablet Take 1 tablet (40 mg total) by mouth 2 (two) times daily before a meal. 11/08/21   Hosie Poisson, MD  insulin aspart (NOVOLOG) 100 UNIT/ML FlexPen Inject 5 Units into the skin 3 (three) times daily with meals. 02/23/18 07/05/19  Donne Hazel, MD    Physical Exam: Vitals:   02/16/22 0830 02/16/22 0930 02/16/22 0951 02/16/22 1130  BP: (!) 172/112 (!) 182/130  113/67  Pulse: (!) 116 (!) 110  100  Resp: 18 16  11   Temp:   98.6 F (37 C)   TempSrc:   Oral   SpO2: 99% 98%  98%  Weight:      Height:       Physical Exam Vitals and nursing note reviewed.  Constitutional:      General: She is awake. She is not in acute distress.    Appearance: She is well-developed.  HENT:     Head: Normocephalic.     Nose: No rhinorrhea.     Mouth/Throat:     Mouth: Mucous membranes are dry.  Eyes:     General: No scleral icterus.    Pupils: Pupils are equal, round, and reactive to light.  Neck:     Vascular: No JVD.  Cardiovascular:     Rate and Rhythm: Normal rate and regular rhythm.     Heart sounds: S1 normal and S2 normal.  Pulmonary:     Effort: Pulmonary effort is normal.     Breath sounds: Normal breath sounds. No wheezing, rhonchi or rales.  Abdominal:     General: Bowel sounds are normal.     Palpations: Abdomen is soft.     Tenderness: There is abdominal  tenderness in the epigastric area. There is no guarding or rebound.  Musculoskeletal:     Cervical back: Neck supple.     Right lower leg: No edema.     Left lower leg: No edema.  Skin:    General: Skin is warm and dry.  Neurological:     General: No focal deficit present.     Mental Status: She is alert and oriented to person, place, and time.  Psychiatric:        Mood and Affect: Mood normal.        Behavior: Behavior normal. Behavior is cooperative.   Data Reviewed:  Results are pending, will review when available.  Assessment and Plan: Principal Problem:   Diabetic gastroparesis associated with type 1 diabetes mellitus ([place]) Observation/telemetry. Keep NPO. Advance to CLD as tolerated. Continue IV fluids. Analgesics as needed. Antiemetics as needed. Pantoprazole 40 mg IVP every 24 hours. Keep electrolytes optimized. Follow-up CBC and CMP in AM.  Active Problems:   Sinus tachycardia Resolved with IV fluids and symptoms management. Continue IV hydration.    DM type 1 (diabetes mellitus, type 1) (HCC) Continue Levemir 16 units SQ daily. CBG monitoring before meals and bedtime.    Essential hypertension Continue carvedilol 6.25 mg p.o. twice daily.    GERD without esophagitis Continue pantoprazole IV. Switch to oral formulation if tolerating diet in AM.    Diabetic polyneuropathy associated with type 1 diabetes mellitus (HCC) Continue gabapentin 300 mg p.o. 3 times daily.    Normocytic anemia Monitor hematocrit and hemoglobin.    Hyperlipidemia Continue atorvastatin 20 mg p.o. daily.     Advance Care Planning:   Code Status: Full Code   Consults:   Family Communication:   Severity of Illness: The appropriate patient status for this patient is OBSERVATION. Observation status is judged to be reasonable and necessary in order to provide the required intensity of service to ensure the patient's safety. The patient's presenting symptoms, physical exam  findings, and initial radiographic and laboratory data in the context of their medical condition is felt to place them at decreased risk for further clinical deterioration. Furthermore, it is anticipated that the patient will be medically  stable for discharge from the hospital within 2 midnights of admission.   Author: Reubin Milan, MD 02/16/2022 12:21 PM  For on call review [email or web address].   This document was prepared using Dragon voice recognition software and may contain some unintended transcription errors.

## 2022-02-16 NOTE — ED Provider Notes (Signed)
[facility]-EMERGENCY DEPT Provider Note   CSN: [id number] Arrival date & time: 02/16/22  0120     History  Chief Complaint  Patient presents with   Abdominal Pain    Kelli Hudson is a 31 y.o. female.   Abdominal Pain    Patient with medical history of hypertension, lipidemia, type 1 diabetes, gastroparesis presents to the emergency department due to nausea and vomiting.  Patient states been going on for some number of days, she was admitted to [facility] but discharged "too early".  She has not felt fully better since then.  States she is nauseated but not currently having any emesis since arriving at the ED.  She has generalized abdominal tenderness without radiation anywhere else.  No dysuria, hematuria.  No previous abdominal surgeries.  Home Medications Prior to Admission medications   Medication Sig Start Date End Date Taking? Authorizing Provider  atorvastatin (LIPITOR) 20 MG tablet Take 1 tablet (20 mg total) by mouth daily. 10/29/21   Loyola Mast, MD  carvedilol (COREG) 12.5 MG tablet Take 0.5 tablets (6.25 mg total) by mouth 2 (two) times daily with a meal. 12/30/21   Dorcas Carrow, MD  Continuous Blood Gluc Sensor (DEXCOM G6 SENSOR) MISC Place 1 sensor into the skin every 10 days 01/27/22   Shamleffer, Konrad Dolores, MD  Continuous Blood Gluc Transmit (DEXCOM G6 TRANSMITTER) MISC Change every 90 days 11/29/21   Shamleffer, Konrad Dolores, MD  dicyclomine (BENTYL) 20 MG tablet Take 1 tablet (20 mg total) by mouth 3 (three) times daily as needed for spasms. Patient not taking: Reported on 12/25/2021 11/08/21   Kathlen Mody, MD  gabapentin (NEURONTIN) 300 MG capsule Take 1 capsule (300 mg total) by mouth 3 (three) times daily. 12/30/21 03/30/22  Dorcas Carrow, MD  hydrOXYzine (VISTARIL) 25 MG capsule Take 1 capsule (25 mg total) by mouth every 8 (eight) hours as needed. Patient taking differently: Take 25 mg by mouth every 8 (eight)  hours as needed for anxiety. 07/31/21   Alwyn Ren, MD  insulin detemir (LEVEMIR FLEXTOUCH) 100 UNIT/ML FlexPen Inject 16 Units into the skin daily. Patient taking differently: Inject 16 Units into the skin daily before breakfast. 06/14/21   Burnadette Pop, MD  insulin regular (NOVOLIN R) 100 units/mL injection Inject 0-0.15 mLs (0-15 Units total) into the skin See admin instructions. Via pump.Start this medication after you follow up with your PCP Patient taking differently: Inject 0-15 Units into the skin See admin instructions. Inject 0-15 units into the skin three times a day with meals, per sliding scale 06/14/21   Burnadette Pop, MD  metoCLOPramide (REGLAN) 10 MG tablet Take 1 tablet (10 mg total) by mouth every 8 (eight) hours as needed for nausea or vomiting. Patient taking differently: Take 10 mg by mouth 3 (three) times daily before meals. 11/08/21   Kathlen Mody, MD  ondansetron (ZOFRAN) 4 MG tablet Take 4 mg by mouth every 8 (eight) hours as needed for nausea or vomiting.    [provider]  pantoprazole (PROTONIX) 40 MG tablet Take 1 tablet (40 mg total) by mouth 2 (two) times daily before a meal. 11/08/21   Kathlen Mody, MD  insulin aspart (NOVOLOG) 100 UNIT/ML FlexPen Inject 5 Units into the skin 3 (three) times daily with meals. 02/23/18 07/05/19  Jerald Kief, MD      Allergies    Patient has no known allergies.    Review of Systems   Review of Systems  Gastrointestinal:  Positive  for abdominal pain.    Physical Exam Updated Vital Signs BP 113/67   Pulse 100   Temp 98.6 F (37 C) (Oral)   Resp 11   Ht 5\' 8"  (1.727 m)   Wt 84.4 kg   LMP  (LMP Unknown)   SpO2 98%   BMI 28.28 kg/m  Physical Exam Vitals and nursing note reviewed. Exam conducted with a chaperone present.  Constitutional:      Appearance: Normal appearance.  HENT:     Head: Normocephalic and atraumatic.  Eyes:     General: No scleral icterus.       Right eye: No discharge.         Left eye: No discharge.     Extraocular Movements: Extraocular movements intact.     Pupils: Pupils are equal, round, and reactive to light.  Cardiovascular:     Rate and Rhythm: Regular rhythm. Tachycardia present.     Pulses: Normal pulses.     Heart sounds: Normal heart sounds.     No friction rub. No gallop.  Pulmonary:     Effort: Pulmonary effort is normal. No respiratory distress.     Breath sounds: Normal breath sounds.  Abdominal:     General: Abdomen is flat. Bowel sounds are normal. There is no distension.     Palpations: Abdomen is soft.     Tenderness: There is generalized abdominal tenderness.  Skin:    General: Skin is warm and dry.     Coloration: Skin is not jaundiced.  Neurological:     Mental Status: She is alert. Mental status is at baseline.     Coordination: Coordination normal.     ED Results / Procedures / Treatments   Labs (all labs ordered are listed, but only abnormal results are displayed) Labs Reviewed  COMPREHENSIVE METABOLIC PANEL - Abnormal; Notable for the following components:      Result Value   Glucose, Bld 300 (*)    Creatinine, Ser 1.19 (*)    Total Protein 6.0 (*)    Albumin 2.7 (*)    All other components within normal limits  CBC - Abnormal; Notable for the following components:   RBC 3.60 (*)    Hemoglobin 10.4 (*)    HCT 31.4 (*)    Platelets 407 (*)    All other components within normal limits  URINALYSIS, ROUTINE W REFLEX MICROSCOPIC - Abnormal; Notable for the following components:   APPearance HAZY (*)    Glucose, UA >=500 (*)    Hgb urine dipstick SMALL (*)    Ketones, ur 20 (*)    Protein, ur >=300 (*)    Bacteria, UA RARE (*)    All other components within normal limits  BLOOD GAS, VENOUS - Abnormal; Notable for the following components:   pH, Ven 7.44 (*)    pCO2, Ven 43 (*)    pO2, Ven 48 (*)    Bicarbonate 29.2 (*)    Acid-Base Excess 4.4 (*)    All other components within normal limits  BETA-HYDROXYBUTYRIC  ACID - Abnormal; Notable for the following components:   Beta-Hydroxybutyric Acid 1.87 (*)    All other components within normal limits  CBG MONITORING, ED - Abnormal; Notable for the following components:   Glucose-Capillary 271 (*)    All other components within normal limits  LIPASE, BLOOD  PREGNANCY, URINE    EKG EKG Interpretation  Date/Time:  Sunday February 16 2022 08:43:33 EST Ventricular Rate:  116 PR Interval:  149  QRS Duration: 74 QT Interval:  242 QTC Calculation: 336 R Axis:   74 Text Interpretation: Sinus tachycardia Biatrial enlargement Borderline T wave abnormalities when cmpared to prior,  similar appearance. No STEMI Confirmed by Theda Belfast ([id number]) on 02/16/2022 11:19:04 AM  Radiology No results found.  Procedures Procedures    Medications Ordered in ED Medications  lactated ringers bolus 1,000 mL (has no administration in time range)  lactated ringers bolus 1,000 mL (1,000 mLs Intravenous New Bag/Given 02/16/22 0959)  metoCLOPramide (REGLAN) injection 10 mg (10 mg Intravenous Given 02/16/22 0958)  HYDROmorphone (DILAUDID) injection 1 mg (1 mg Intravenous Given 02/16/22 0958)  LORazepam (ATIVAN) injection 1 mg (1 mg Intravenous Given 02/16/22 1021)    ED Course/ Medical Decision Making/ A&P                           Medical Decision Making Amount and/or Complexity of Data Reviewed Labs: ordered.  Risk Prescription drug management. Decision regarding hospitalization.   Patient presents due to nausea vomiting and generalized abdominal pain.  Differential includes not limited to DKA, HHS, metabolic abnormality, AKI, dehydration, sepsis, acute intra-abdominal process.  Proceed with laboratory workup and EKG.  She is tachycardic on exam with generalized abdominal tenderness without rigidity or guarding.  Do not think we need to proceed with imaging as his history of gastroparesis and this feels very similar for the patient.  I ordered and  reviewed interpreted laboratory workup. No leukocytosis.  Anemia baseline with a hemoglobin of 10.4.  No gross electrolyte derangement, transaminitis.  Creatinine is roughly at baseline actually improved compared to prior.  Patient is not acidotic, not in DKA, doubt HHS.  She does have signs of dehydration on the UA with glucosuria.  I ordered lactated Ringer's x 2 L, Ativan, Dilaudid.  Will hold off on Zofran given history of QT prolongation, EKG shows sinus tachycardia without any obvious QT prolongation.  I reevaluate patient multiple times throughout the ED stay.  She states no improvement despite medication and fluids.  Will consult hospitalist for admission for gastroparesis and intractable nausea and vomiting.        Final Clinical Impression(s) / ED Diagnoses Final diagnoses:  Gastroparesis  Nausea and vomiting, unspecified vomiting type    Rx / DC Orders ED Discharge Orders     None         Theron Arista, [place] 02/16/22 1233    Tegeler, Canary Brim, MD 02/16/22 [phone]

## 2022-02-16 NOTE — Progress Notes (Signed)
Patient triggering yellow mews. Contacted Sanda Klein, MD. Increased orders for Metoprolol. Patient is having pain, will administer pain medications when IV is replaced. Patient stable.  02/16/22 1427  Assess: MEWS Score  Temp 98.5 F (36.9 C)  BP (!) 144/100  MAP (mmHg) 113  Pulse Rate (!) 113  Resp 18  Level of Consciousness Alert  SpO2 98 %  O2 Device Room Air  Assess: MEWS Score  MEWS Temp 0  MEWS Systolic 0  MEWS Pulse 2  MEWS RR 0  MEWS LOC 0  MEWS Score 2  MEWS Score Color Yellow  Assess: if the MEWS score is Yellow or Red  Were vital signs taken at a resting state? Yes  Focused Assessment Change from prior assessment (see assessment flowsheet)  Does the patient meet 2 or more of the SIRS criteria? No  MEWS guidelines implemented *See Row Information* Yes  Treat  MEWS Interventions Escalated (See documentation below)  Take Vital Signs  Increase Vital Sign Frequency  Yellow: Q 2hr X 2 then Q 4hr X 2, if remains yellow, continue Q 4hrs  Escalate  MEWS: Escalate Yellow: discuss with charge nurse/RN and consider discussing with provider and RRT  Notify: Charge Nurse/RN  Name of Charge Nurse/RN Notified Scarlett Presto, RN  Date Charge Nurse/RN Notified 02/16/22  Time Charge Nurse/RN Notified 1430  Provider Notification  Provider Name/Title Sanda Klein, MD  Date Provider Notified 02/16/22  Time Provider Notified 1445  Method of Notification Page  Notification Reason Change in status  Provider response See new orders  Date of Provider Response 02/16/22  Time of Provider Response 1455  Assess: SIRS CRITERIA  SIRS Temperature  0  SIRS Pulse 1  SIRS Respirations  0  SIRS WBC 0  SIRS Score Sum  1

## 2022-02-17 DIAGNOSIS — K3184 Gastroparesis: Secondary | ICD-10-CM | POA: Diagnosis not present

## 2022-02-17 DIAGNOSIS — E1043 Type 1 diabetes mellitus with diabetic autonomic (poly)neuropathy: Secondary | ICD-10-CM | POA: Diagnosis not present

## 2022-02-17 LAB — COMPREHENSIVE METABOLIC PANEL
ALT: 14 U/L (ref 0–44)
AST: 16 U/L (ref 15–41)
Albumin: 2.6 g/dL — ABNORMAL LOW (ref 3.5–5.0)
Alkaline Phosphatase: 72 U/L (ref 38–126)
Anion gap: 7 (ref 5–15)
BUN: 11 mg/dL (ref 6–20)
CO2: 24 mmol/L (ref 22–32)
Calcium: 8.6 mg/dL — ABNORMAL LOW (ref 8.9–10.3)
Chloride: 104 mmol/L (ref 98–111)
Creatinine, Ser: 1.03 mg/dL — ABNORMAL HIGH (ref 0.44–1.00)
GFR, Estimated: >60 mL/min (ref >60)
Glucose, Bld: 224 mg/dL — ABNORMAL HIGH (ref 70–99)
Potassium: 4.6 mmol/L (ref 3.5–5.1)
Sodium: 135 mmol/L (ref 135–145)
Total Bilirubin: 0.9 mg/dL (ref 0.3–1.2)
Total Protein: 5.5 g/dL — ABNORMAL LOW (ref 6.5–8.1)

## 2022-02-17 LAB — GLUCOSE, CAPILLARY
Glucose-Capillary: 152 mg/dL — ABNORMAL HIGH (ref 70–99)
Glucose-Capillary: 234 mg/dL — ABNORMAL HIGH (ref 70–99)

## 2022-02-17 LAB — CBC
HCT: 34.7 % — ABNORMAL LOW (ref 36.0–46.0)
Hemoglobin: 11.1 g/dL — ABNORMAL LOW (ref 12.0–15.0)
MCH: 28.7 pg (ref 26.0–34.0)
MCHC: 32 g/dL (ref 30.0–36.0)
MCV: 89.7 fL (ref 80.0–100.0)
Platelets: 402 10*3/uL — ABNORMAL HIGH (ref 150–400)
RBC: 3.87 MIL/uL (ref 3.87–5.11)
RDW: 13 % (ref 11.5–15.5)
WBC: 8.9 10*3/uL (ref 4.0–10.5)
nRBC: 0 % (ref 0.0–0.2)

## 2022-02-17 LAB — MAGNESIUM: Magnesium: 1.9 mg/dL (ref 1.7–2.4)

## 2022-02-17 MED ORDER — METOCLOPRAMIDE HCL 10 MG PO TABS: 10.0000 mg | ORAL_TABLET | Freq: Three times a day (TID) | ORAL | Status: DC

## 2022-02-17 MED ORDER — PANTOPRAZOLE SODIUM 40 MG PO TBEC
40.0000 mg | DELAYED_RELEASE_TABLET | Freq: Every day | ORAL | Status: DC
  Administered 2022-02-17: 40 mg via ORAL
  Filled 2022-02-17: qty 1

## 2022-02-17 MED ORDER — INSULIN REGULAR HUMAN 100 UNIT/ML IJ SOLN: 0.0000 [IU] | INTRAMUSCULAR | Status: DC

## 2022-02-17 NOTE — Progress Notes (Signed)
Reviewed discharge paperwork with patient including medication regimen and scheduled follow up appointments. Patient was escorted to main entrance by NT.

## 2022-02-17 NOTE — Discharge Summary (Signed)
Physician Discharge Summary  Kelli Hudson HKV:[id number] DOB: 1990-11-11 DOA: 02/16/2022  PCP: [name], MD  Admit date: 02/16/2022 Discharge date: 02/17/2022  Admitted From: Home Disposition: Home  Recommendations for Outpatient Follow-up:  Follow up with PCP in 1 week with repeat CBC/BMP Outpatient follow up with GI Follow up in ED if symptoms worsen or new appear   Home Health: No Equipment/Devices: None  Discharge Condition: Stable CODE STATUS: Full Diet recommendation: Heart healthy/Carb modified  Brief/Interim Summary: 32 y.o. female with PMH significant for DM1, severe gastroparesis, GERD, chronic anemia, anxiety/depression, neuropathy, nephropathy, multiple hospitalizations last year for intractable nausea/vomiting due to gastroparesis, last hospitalization from 01/04/2022-01/05/2022 presented with abdominal pain with nausea and vomiting.  She was admitted and started on IV fluids.  During the hospitalization, her condition has improved.  Diet has been advanced and she is tolerating it.  She will be discharged home today with close outpatient follow-up with PCP and GI.  Discharge Diagnoses:   Intractable nausea and vomiting due to severe gastroparesis Recurrent hospitalizations secondary to above -Treated with IV fluids and antiemetics.  Diet has been advanced from clear liquid diet to carb modified diet and she is tolerating it.  She will be discharged home today with close outpatient follow-up with PCP and GI.  Continue Reglan and PPI. -Patient has been referred to tertiary centers by GI but apparently she has declined them in the past.  Diabetes mellitus type 1 with hyperglycemia -Carb modified diet.  Continue home regimen.  Outpatient follow-up with PCP  Thrombocytosis Possibly reactive.  Outpatient follow-up  Normocytic anemia -From chronic illnesses.  Hemoglobin stable.  No signs of bleeding.  Outpatient follow-up  Hypertension -Continue Coreg and  losartan.  Outpatient follow-up  Hyperlipidemia -Continue statin  Anxiety -Continue Vistaril as needed Discharge Instructions  Discharge Instructions     Ambulatory referral to Gastroenterology   Complete by: As directed    Hospital followup   What is the reason for referral?: Other      Allergies as of 02/17/2022       Reactions   Lisinopril Cough   breakout        Medication List     TAKE these medications    atorvastatin 20 MG tablet Commonly known as: LIPITOR Take 1 tablet (20 mg total) by mouth daily.   carvedilol 12.5 MG tablet Commonly known as: COREG Take 0.5 tablets (6.25 mg total) by mouth 2 (two) times daily with a meal.   Dexcom G6 Sensor Misc Place 1 sensor into the skin every 10 days   Dexcom G6 Transmitter Misc Change every 90 days   dicyclomine 20 MG tablet Commonly known as: BENTYL Take 1 tablet (20 mg total) by mouth 3 (three) times daily as needed for spasms.   gabapentin 300 MG capsule Commonly known as: NEURONTIN Take 1 capsule (300 mg total) by mouth 3 (three) times daily.   hydrOXYzine 25 MG capsule Commonly known as: VISTARIL Take 1 capsule (25 mg total) by mouth every 8 (eight) hours as needed. What changed: reasons to take this   insulin regular 100 units/mL injection Commonly known as: NovoLIN R Inject 0-0.15 mLs (0-15 Units total) into the skin See admin instructions. Inject 0-15 units into the skin three times a day with meals, per sliding scale   Levemir FlexTouch 100 UNIT/ML FlexPen Generic drug: insulin detemir Inject 16 Units into the skin daily. What changed: when to take this   losartan 25 MG tablet Commonly known as: COZAAR Take  25 mg by mouth daily.   metoCLOPramide 10 MG tablet Commonly known as: REGLAN Take 1 tablet (10 mg total) by mouth 3 (three) times daily before meals.   ondansetron 4 MG tablet Commonly known as: ZOFRAN Take 4 mg by mouth every 8 (eight) hours as needed for nausea or vomiting.    pantoprazole 40 MG tablet Commonly known as: PROTONIX Take 1 tablet (40 mg total) by mouth 2 (two) times daily before a meal.        Follow-up Information     [name], MD. Schedule an appointment as soon as possible for a visit in 1 week(s).   Specialty: Family Medicine Contact information: [place] [address] [phone]                Allergies  Allergen Reactions   Lisinopril Cough    breakout    Consultations: None   Procedures/Studies: No results found.    Subjective: Patient seen and examined at bedside.  Feeling better.  Tolerating diet.  Feels okay to go home today.  Discharge Exam: Vitals:   02/17/22 0244 02/17/22 0653  BP: (!) 143/105 132/89  Pulse: 95 88  Resp: 17 17  Temp: 97.8 F (36.6 C) 97.7 F (36.5 C)  SpO2: 100% 99%    General: Pt is alert, awake, not in acute distress.  Chronically ill and deconditioned. Cardiovascular: rate controlled, S1/S2 + Respiratory: bilateral decreased breath sounds at bases Abdominal: Soft, NT, ND, bowel sounds + Extremities: no edema, no cyanosis    The results of significant diagnostics from this hospitalization (including imaging, microbiology, ancillary and laboratory) are listed below for reference.     Microbiology: No results found for this or any previous visit (from the past 240 hour(s)).   Labs: BNP (last 3 results) No results for input(s): "BNP" in the last 8760 hours. Basic Metabolic Panel: Recent Labs  Lab 02/16/22 0946 02/17/22 1114  NA 136 135  K 3.7 4.6  CL 102 104  CO2 24 24  GLUCOSE 300* 224*  BUN 16 11  CREATININE 1.19* 1.03*  CALCIUM 9.0 8.6*  MG  --  1.9   Liver Function Tests: Recent Labs  Lab 02/16/22 0946 02/17/22 1114  AST 15 16  ALT 14 14  ALKPHOS 76 72  BILITOT 1.2 0.9  PROT 6.0* 5.5*  ALBUMIN 2.7* 2.6*   Recent Labs  Lab 02/16/22 0946  LIPASE 34   No results for input(s): "AMMONIA" in the last 168  hours. CBC: Recent Labs  Lab 02/16/22 0946 02/17/22 1114  WBC 8.0 8.9  HGB 10.4* 11.1*  HCT 31.4* 34.7*  MCV 87.2 89.7  PLT 407* 402*   Cardiac Enzymes: No results for input(s): "CKTOTAL", "CKMB", "CKMBINDEX", "TROPONINI" in the last 168 hours. BNP: Invalid input(s): "POCBNP" CBG: Recent Labs  Lab 02/16/22 0738 02/16/22 1420 02/16/22 1812 02/17/22 0608 02/17/22 1212  GLUCAP 271* 262* 194* 152* 234*   D-Dimer No results for input(s): "DDIMER" in the last 72 hours. Hgb A1c No results for input(s): "HGBA1C" in the last 72 hours. Lipid Profile No results for input(s): "CHOL", "HDL", "LDLCALC", "TRIG", "CHOLHDL", "LDLDIRECT" in the last 72 hours. Thyroid function studies No results for input(s): "TSH", "T4TOTAL", "T3FREE", "THYROIDAB" in the last 72 hours.  Invalid input(s): "FREET3" Anemia work up No results for input(s): "VITAMINB12", "FOLATE", "FERRITIN", "TIBC", "IRON", "RETICCTPCT" in the last 72 hours. Urinalysis    Component Value Date/Time   COLORURINE YELLOW 02/16/2022 0245   APPEARANCEUR  HAZY (A) 02/16/2022 0245   LABSPEC 1.012 02/16/2022 0245   PHURINE 5.0 02/16/2022 0245   GLUCOSEU >=500 (A) 02/16/2022 0245   GLUCOSEU NEGATIVE 05/22/2020 1417   HGBUR SMALL (A) 02/16/2022 0245   BILIRUBINUR NEGATIVE 02/16/2022 0245   KETONESUR 20 (A) 02/16/2022 0245   PROTEINUR >=300 (A) 02/16/2022 0245   UROBILINOGEN 0.2 05/22/2020 1417   NITRITE NEGATIVE 02/16/2022 0245   LEUKOCYTESUR NEGATIVE 02/16/2022 0245   Sepsis Labs Recent Labs  Lab 02/16/22 0946 02/17/22 1114  WBC 8.0 8.9   Microbiology No results found for this or any previous visit (from the past 240 hour(s)).   Time coordinating discharge: 35 minutes  SIGNED:   Aline August, MD  [facility] 02/17/2022, 1:43 PM

## 2022-02-18 LAB — GLUCOSE, CAPILLARY: Glucose-Capillary: 197 mg/dL — ABNORMAL HIGH (ref 70–99)

## 2022-02-19 ENCOUNTER — Telehealth: Payer: Self-pay

## 2022-02-19 NOTE — Telephone Encounter (Signed)
Transition Care Management Unsuccessful Follow-up Telephone Call  Date of discharge and from where:  [facility] 02/17/2022  Attempts:  1st Attempt  Reason for unsuccessful TCM follow-up call:  Left voice message Juanda Crumble, [place] Direct Dial [phone]

## 2022-02-20 NOTE — Telephone Encounter (Signed)
Transition Care Management Follow-up Telephone Call Date of discharge and from where: [facility] 02/17/2022 How have you been since you were released from the hospital? better Any questions or concerns? No  Items Reviewed: Did the pt receive and understand the discharge instructions provided? Yes  Medications obtained and verified? Yes  Other? No  Any new allergies since your discharge? No  Dietary orders reviewed? Yes Do you have support at home? Yes   Home Care and Equipment/Supplies: Were home health services ordered? no If so, what is the name of the agency? N/a  Has the agency set up a time to come to the patient's home? not applicable Were any new equipment or medical supplies ordered?  No What is the name of the medical supply agency? N/a Were you able to get the supplies/equipment? not applicable Do you have any questions related to the use of the equipment or supplies? No  Functional Questionnaire: (I = Independent and D = Dependent) ADLs: I  Bathing/Dressing- I  Meal Prep- I  Eating- I  Maintaining continence- I  Transferring/Ambulation- I  Managing Meds- I  Follow up appointments reviewed:  PCP Hospital f/u appt confirmed? Yes  Scheduled to see Dr Gena Fray on 03/03/2022 @ 2:00. [facility] f/u appt confirmed? No  Patient waiting on call back Are transportation arrangements needed? No  If their condition worsens, is the pt aware to call PCP or go to the Emergency Dept.? Yes Was the patient provided with contact information for the PCP's office or ED? Yes Was to pt encouraged to call back with questions or concerns? Yes Juanda Crumble, LPN [place] Direct Dial [phone]

## 2022-02-25 ENCOUNTER — Encounter (INDEPENDENT_AMBULATORY_CARE_PROVIDER_SITE_OTHER): Payer: Medicaid Other | Admitting: Ophthalmology

## 2022-02-25 ENCOUNTER — Encounter (INDEPENDENT_AMBULATORY_CARE_PROVIDER_SITE_OTHER): Payer: Self-pay

## 2022-02-25 DIAGNOSIS — H3581 Retinal edema: Secondary | ICD-10-CM

## 2022-02-26 ENCOUNTER — Telehealth: Payer: Self-pay

## 2022-02-26 NOTE — Telephone Encounter (Signed)
..   Medicaid Managed Care Note  02/26/2022 Name: Kelli Hudson MRN: [id number] DOB: [birth year]  Kelli Hudson is a 32 y.o. year old female who is a primary care patient of [name], MD and is actively engaged with the care management team. I reached out to [employer] by phone today to assist with scheduling an initial visit with the RN Case Manager  Follow up plan: Unsuccessful telephone outreach attempt made. A HIPAA compliant phone message was left for the patient providing contact information and requesting a return call.  The care management team will reach out to the patient again over the next 7 days.    [place]

## 2022-03-03 ENCOUNTER — Ambulatory Visit: Payer: 59 | Admitting: Family Medicine

## 2022-03-03 ENCOUNTER — Telehealth: Payer: Self-pay | Admitting: Family Medicine

## 2022-03-03 NOTE — Telephone Encounter (Signed)
1.15.24 no show letter sent

## 2022-03-04 ENCOUNTER — Other Ambulatory Visit: Payer: Self-pay

## 2022-03-04 ENCOUNTER — Inpatient Hospital Stay (HOSPITAL_COMMUNITY)
Admission: EM | Admit: 2022-03-04 | Discharge: 2022-03-06 | DRG: 639 | Disposition: A | Discharge status: Home / Self Care | Payer: Medicaid Other | Attending: Internal Medicine | Admitting: Internal Medicine

## 2022-03-04 DIAGNOSIS — N182 Chronic kidney disease, stage 2 (mild): Secondary | ICD-10-CM | POA: Diagnosis present

## 2022-03-04 DIAGNOSIS — Z833 Family history of diabetes mellitus: Secondary | ICD-10-CM

## 2022-03-04 DIAGNOSIS — E109 Type 1 diabetes mellitus without complications: Secondary | ICD-10-CM | POA: Diagnosis present

## 2022-03-04 DIAGNOSIS — E1069 Type 1 diabetes mellitus with other specified complication: Secondary | ICD-10-CM | POA: Diagnosis present

## 2022-03-04 DIAGNOSIS — R000 Tachycardia, unspecified: Secondary | ICD-10-CM | POA: Diagnosis present

## 2022-03-04 DIAGNOSIS — R112 Nausea with vomiting, unspecified: Secondary | ICD-10-CM | POA: Diagnosis present

## 2022-03-04 DIAGNOSIS — E1010 Type 1 diabetes mellitus with ketoacidosis without coma: Principal | ICD-10-CM | POA: Diagnosis present

## 2022-03-04 DIAGNOSIS — E1143 Type 2 diabetes mellitus with diabetic autonomic (poly)neuropathy: Secondary | ICD-10-CM | POA: Diagnosis present

## 2022-03-04 DIAGNOSIS — K219 Gastro-esophageal reflux disease without esophagitis: Secondary | ICD-10-CM | POA: Diagnosis present

## 2022-03-04 DIAGNOSIS — F419 Anxiety disorder, unspecified: Secondary | ICD-10-CM | POA: Diagnosis present

## 2022-03-04 DIAGNOSIS — E0865 Diabetes mellitus due to underlying condition with hyperglycemia: Secondary | ICD-10-CM

## 2022-03-04 DIAGNOSIS — E1022 Type 1 diabetes mellitus with diabetic chronic kidney disease: Secondary | ICD-10-CM | POA: Diagnosis present

## 2022-03-04 DIAGNOSIS — K3184 Gastroparesis: Secondary | ICD-10-CM | POA: Diagnosis present

## 2022-03-04 DIAGNOSIS — E785 Hyperlipidemia, unspecified: Secondary | ICD-10-CM | POA: Diagnosis present

## 2022-03-04 DIAGNOSIS — I152 Hypertension secondary to endocrine disorders: Secondary | ICD-10-CM | POA: Diagnosis present

## 2022-03-04 DIAGNOSIS — E1110 Type 2 diabetes mellitus with ketoacidosis without coma: Secondary | ICD-10-CM | POA: Diagnosis present

## 2022-03-04 DIAGNOSIS — Z794 Long term (current) use of insulin: Secondary | ICD-10-CM

## 2022-03-04 DIAGNOSIS — Z89422 Acquired absence of other left toe(s): Secondary | ICD-10-CM

## 2022-03-04 DIAGNOSIS — E1065 Type 1 diabetes mellitus with hyperglycemia: Secondary | ICD-10-CM

## 2022-03-04 DIAGNOSIS — E1042 Type 1 diabetes mellitus with diabetic polyneuropathy: Secondary | ICD-10-CM | POA: Diagnosis present

## 2022-03-04 DIAGNOSIS — Z888 Allergy status to other drugs, medicaments and biological substances status: Secondary | ICD-10-CM

## 2022-03-04 DIAGNOSIS — N179 Acute kidney failure, unspecified: Secondary | ICD-10-CM | POA: Diagnosis present

## 2022-03-04 DIAGNOSIS — E0590 Thyrotoxicosis, unspecified without thyrotoxic crisis or storm: Secondary | ICD-10-CM | POA: Diagnosis present

## 2022-03-04 DIAGNOSIS — Z79899 Other long term (current) drug therapy: Secondary | ICD-10-CM

## 2022-03-04 DIAGNOSIS — E1043 Type 1 diabetes mellitus with diabetic autonomic (poly)neuropathy: Secondary | ICD-10-CM | POA: Diagnosis present

## 2022-03-04 LAB — BASIC METABOLIC PANEL
Anion gap: 10 (ref 5–15)
Anion gap: 9 (ref 5–15)
BUN: 23 mg/dL — ABNORMAL HIGH (ref 6–20)
BUN: 21 mg/dL — ABNORMAL HIGH (ref 6–20)
CO2: 23 mmol/L (ref 22–32)
CO2: 24 mmol/L (ref 22–32)
Calcium: 8 mg/dL — ABNORMAL LOW (ref 8.9–10.3)
Calcium: 8.1 mg/dL — ABNORMAL LOW (ref 8.9–10.3)
Chloride: 101 mmol/L (ref 98–111)
Chloride: 102 mmol/L (ref 98–111)
Creatinine, Ser: 1.28 mg/dL — ABNORMAL HIGH (ref 0.44–1.00)
Creatinine, Ser: 1.13 mg/dL — ABNORMAL HIGH (ref 0.44–1.00)
GFR, Estimated: 58 mL/min — ABNORMAL LOW (ref >60)
GFR, Estimated: >60 mL/min (ref >60)
Glucose, Bld: 186 mg/dL — ABNORMAL HIGH (ref 70–99)
Glucose, Bld: 202 mg/dL — ABNORMAL HIGH (ref 70–99)
Potassium: 4 mmol/L (ref 3.5–5.1)
Potassium: 4.2 mmol/L (ref 3.5–5.1)
Sodium: 134 mmol/L — ABNORMAL LOW (ref 135–145)
Sodium: 135 mmol/L (ref 135–145)

## 2022-03-04 LAB — CBG MONITORING, ED
Glucose-Capillary: 400 mg/dL — ABNORMAL HIGH (ref 70–99)
Glucose-Capillary: 340 mg/dL — ABNORMAL HIGH (ref 70–99)
Glucose-Capillary: 258 mg/dL — ABNORMAL HIGH (ref 70–99)
Glucose-Capillary: 129 mg/dL — ABNORMAL HIGH (ref 70–99)
Glucose-Capillary: 133 mg/dL — ABNORMAL HIGH (ref 70–99)
Glucose-Capillary: 184 mg/dL — ABNORMAL HIGH (ref 70–99)

## 2022-03-04 LAB — COMPREHENSIVE METABOLIC PANEL
ALT: 19 U/L (ref 0–44)
AST: 22 U/L (ref 15–41)
Albumin: 3.3 g/dL — ABNORMAL LOW (ref 3.5–5.0)
Alkaline Phosphatase: 103 U/L (ref 38–126)
Anion gap: 20 — ABNORMAL HIGH (ref 5–15)
BUN: 22 mg/dL — ABNORMAL HIGH (ref 6–20)
CO2: 21 mmol/L — ABNORMAL LOW (ref 22–32)
Calcium: 9.9 mg/dL (ref 8.9–10.3)
Chloride: 93 mmol/L — ABNORMAL LOW (ref 98–111)
Creatinine, Ser: 1.28 mg/dL — ABNORMAL HIGH (ref 0.44–1.00)
GFR, Estimated: 58 mL/min — ABNORMAL LOW (ref >60)
Glucose, Bld: 429 mg/dL — ABNORMAL HIGH (ref 70–99)
Potassium: 4.2 mmol/L (ref 3.5–5.1)
Sodium: 134 mmol/L — ABNORMAL LOW (ref 135–145)
Total Bilirubin: 1.1 mg/dL (ref 0.3–1.2)
Total Protein: 7.8 g/dL (ref 6.5–8.1)

## 2022-03-04 LAB — BLOOD GAS, VENOUS
Acid-base deficit: 0.7 mmol/L (ref 0.0–2.0)
Bicarbonate: 23.4 mmol/L (ref 20.0–28.0)
O2 Saturation: 58.6 %
Patient temperature: 37
pCO2, Ven: 36 mmHg — ABNORMAL LOW (ref 44–60)
pH, Ven: 7.42 (ref 7.25–7.43)
pO2, Ven: 35 mmHg (ref 32–45)

## 2022-03-04 LAB — CBC
HCT: 39.2 % (ref 36.0–46.0)
Hemoglobin: 12.7 g/dL (ref 12.0–15.0)
MCH: 28.1 pg (ref 26.0–34.0)
MCHC: 32.4 g/dL (ref 30.0–36.0)
MCV: 86.7 fL (ref 80.0–100.0)
Platelets: 534 10*3/uL — ABNORMAL HIGH (ref 150–400)
RBC: 4.52 MIL/uL (ref 3.87–5.11)
RDW: 13.1 % (ref 11.5–15.5)
WBC: 9.5 10*3/uL (ref 4.0–10.5)
nRBC: 0 % (ref 0.0–0.2)

## 2022-03-04 LAB — OCCULT BLOOD GASTRIC / DUODENUM (SPECIMEN CUP)
Occult Blood, Gastric: POSITIVE — AB
pH, Gastric: 3

## 2022-03-04 LAB — BETA-HYDROXYBUTYRIC ACID: Beta-Hydroxybutyric Acid: 5.69 mmol/L — ABNORMAL HIGH (ref 0.05–0.27)

## 2022-03-04 LAB — LIPASE, BLOOD: Lipase: 30 U/L (ref 11–51)

## 2022-03-04 LAB — PH, GASTRIC FLUID (GASTROCCULT CARD): pH, Gastric: 3

## 2022-03-04 LAB — HCG, QUANTITATIVE, PREGNANCY: hCG, Beta Chain, Quant, S: <1 m[IU]/mL (ref <5)

## 2022-03-04 MED ORDER — POTASSIUM CHLORIDE 10 MEQ/100ML IV SOLN
10.0000 meq | INTRAVENOUS | Status: AC
  Administered 2022-03-04 (×4): 10 meq via INTRAVENOUS
  Filled 2022-03-04 (×3): qty 100

## 2022-03-04 MED ORDER — GABAPENTIN 300 MG PO CAPS
300.0000 mg | ORAL_CAPSULE | Freq: Three times a day (TID) | ORAL | Status: DC
  Administered 2022-03-04 – 2022-03-06 (×6): 300 mg via ORAL
  Filled 2022-03-04 (×6): qty 1

## 2022-03-04 MED ORDER — ONDANSETRON HCL 4 MG/2ML IJ SOLN
4.0000 mg | Freq: Four times a day (QID) | INTRAMUSCULAR | Status: DC | PRN
  Administered 2022-03-05: 4 mg via INTRAVENOUS
  Filled 2022-03-04: qty 2

## 2022-03-04 MED ORDER — HYDROMORPHONE HCL 1 MG/ML IJ SOLN
0.5000 mg | Freq: Once | INTRAMUSCULAR | Status: AC
  Administered 2022-03-04: 0.5 mg via INTRAMUSCULAR

## 2022-03-04 MED ORDER — DROPERIDOL 2.5 MG/ML IJ SOLN
1.2500 mg | Freq: Once | INTRAMUSCULAR | Status: DC
  Filled 2022-03-04: qty 2

## 2022-03-04 MED ORDER — SODIUM CHLORIDE 0.9 % IV BOLUS
1000.0000 mL | Freq: Once | INTRAVENOUS | Status: AC
  Administered 2022-03-04: 1000 mL via INTRAVENOUS

## 2022-03-04 MED ORDER — DEXTROSE IN LACTATED RINGERS 5 % IV SOLN: INTRAVENOUS | Status: DC

## 2022-03-04 MED ORDER — METOCLOPRAMIDE HCL 5 MG/ML IJ SOLN
10.0000 mg | Freq: Four times a day (QID) | INTRAMUSCULAR | Status: DC
  Administered 2022-03-04 – 2022-03-06 (×7): 10 mg via INTRAVENOUS
  Filled 2022-03-04 (×8): qty 2

## 2022-03-04 MED ORDER — INSULIN REGULAR(HUMAN) IN NACL 100-0.9 UT/100ML-% IV SOLN
INTRAVENOUS | Status: DC
  Administered 2022-03-04: 10 [IU]/h via INTRAVENOUS
  Administered 2022-03-04: 1.5 [IU]/h via INTRAVENOUS
  Filled 2022-03-04: qty 100

## 2022-03-04 MED ORDER — PANTOPRAZOLE SODIUM 40 MG PO TBEC
40.0000 mg | DELAYED_RELEASE_TABLET | Freq: Two times a day (BID) | ORAL | Status: DC
  Administered 2022-03-04 – 2022-03-06 (×4): 40 mg via ORAL
  Filled 2022-03-04 (×4): qty 1

## 2022-03-04 MED ORDER — HYDROMORPHONE HCL 1 MG/ML IJ SOLN
0.5000 mg | Freq: Once | INTRAMUSCULAR | Status: DC
  Filled 2022-03-04: qty 1

## 2022-03-04 MED ORDER — LACTATED RINGERS IV BOLUS
1680.0000 mL | Freq: Once | INTRAVENOUS | Status: AC
  Administered 2022-03-04: 1680 mL via INTRAVENOUS

## 2022-03-04 MED ORDER — DROPERIDOL 2.5 MG/ML IJ SOLN
1.2500 mg | Freq: Once | INTRAMUSCULAR | Status: AC
  Administered 2022-03-04: 1.25 mg via INTRAMUSCULAR

## 2022-03-04 MED ORDER — DROPERIDOL 2.5 MG/ML IJ SOLN
1.2500 mg | Freq: Once | INTRAMUSCULAR | Status: AC
  Administered 2022-03-04: 1.25 mg via INTRAVENOUS
  Filled 2022-03-04: qty 2

## 2022-03-04 MED ORDER — LACTATED RINGERS IV SOLN: INTRAVENOUS | Status: DC

## 2022-03-04 MED ORDER — ATORVASTATIN CALCIUM 20 MG PO TABS
20.0000 mg | ORAL_TABLET | Freq: Every day | ORAL | Status: DC
  Administered 2022-03-05 – 2022-03-06 (×2): 20 mg via ORAL
  Filled 2022-03-04: qty 2
  Filled 2022-03-04: qty 1

## 2022-03-04 MED ORDER — ONDANSETRON 4 MG PO TBDP
4.0000 mg | ORAL_TABLET | Freq: Once | ORAL | Status: AC | PRN
  Administered 2022-03-04: 4 mg via ORAL
  Filled 2022-03-04: qty 1

## 2022-03-04 MED ORDER — DEXTROSE 50 % IV SOLN: 0.0000 mL | INTRAVENOUS | Status: DC | PRN

## 2022-03-04 MED ORDER — HEPARIN SODIUM (PORCINE) 5000 UNIT/ML IJ SOLN
5000.0000 [IU] | Freq: Three times a day (TID) | INTRAMUSCULAR | Status: DC
  Administered 2022-03-04 – 2022-03-06 (×6): 5000 [IU] via SUBCUTANEOUS
  Filled 2022-03-04 (×6): qty 1

## 2022-03-04 MED ORDER — HYDROXYZINE PAMOATE 25 MG PO CAPS: 25.0000 mg | ORAL_CAPSULE | Freq: Three times a day (TID) | ORAL | Status: DC | PRN

## 2022-03-04 MED ORDER — MORPHINE SULFATE (PF) 2 MG/ML IV SOLN
2.0000 mg | INTRAVENOUS | Status: DC | PRN
  Administered 2022-03-04 – 2022-03-06 (×6): 2 mg via INTRAVENOUS
  Filled 2022-03-04 (×6): qty 1

## 2022-03-04 MED ORDER — HYDROMORPHONE HCL 1 MG/ML IJ SOLN
0.5000 mg | Freq: Once | INTRAMUSCULAR | Status: AC
  Administered 2022-03-04: 0.5 mg via INTRAVENOUS
  Filled 2022-03-04: qty 1

## 2022-03-04 MED ORDER — DICYCLOMINE HCL 20 MG PO TABS
20.0000 mg | ORAL_TABLET | Freq: Three times a day (TID) | ORAL | Status: DC | PRN
  Administered 2022-03-05: 20 mg via ORAL
  Filled 2022-03-04: qty 1

## 2022-03-04 MED ORDER — CARVEDILOL 3.125 MG PO TABS: 6.2500 mg | ORAL_TABLET | Freq: Two times a day (BID) | ORAL | Status: DC

## 2022-03-04 MED ORDER — ACETAMINOPHEN 325 MG PO TABS
650.0000 mg | ORAL_TABLET | Freq: Once | ORAL | Status: AC
  Administered 2022-03-04: 650 mg via ORAL
  Filled 2022-03-04: qty 2

## 2022-03-04 NOTE — ED Triage Notes (Signed)
Pt reports abdominal pain and emesis for the last day related to gastroparesis. Pt reports she has not been keeping her BP meds down and is hypertensive. Pt actively gagging herself in triage.

## 2022-03-04 NOTE — ED Notes (Signed)
Pt reports she would like to wait on blood work due to needing an ultrasound IV.

## 2022-03-04 NOTE — ED Provider Notes (Signed)
[facility] DEPT Provider Note   CSN: [id number] Arrival date & time: 03/04/22  0208     History  Chief Complaint  Patient presents with   Abdominal Pain   Emesis    Kelli Hudson is a 32 y.o. female with past medical history of O1HY complicated by severe gastroparesis and neuropathy, and hypertension who presents with a one day history of intractable vomiting and severe diffuse abdominal pain. Pt was in her normal state of health yesterday, when she started to develop nausea, and soon after profuse vomiting and abdominal pain. Pt had recent hospital admission on 02/16/22 for gastroparesis and was treated with fluids, anti-nausea medication, and reglan. She also had an admission earlier in December for gastroparesis as well. Abdominal pain is sharp and stabbing in nature, and pt is unable to localize pain. Vomiting has been dark since she started yesterday. She is compliant with her home dose of reglan 10mg  daily, and also with her insulin as well. She does state that her glucose has been running high recently, but unable to quantify. This episode is worse than previous one due to increased intensity of pain. She denies any recent fevers, chills, change in bowel habits, or shortness of breath   Abdominal Pain Associated symptoms: vomiting   Emesis Associated symptoms: abdominal pain        Home Medications Prior to Admission medications   Medication Sig Start Date End Date Taking? Authorizing Provider  atorvastatin (LIPITOR) 20 MG tablet Take 1 tablet (20 mg total) by mouth daily. 10/29/21   [name], MD  carvedilol (COREG) 12.5 MG tablet Take 0.5 tablets (6.25 mg total) by mouth 2 (two) times daily with a meal. 12/30/21   Barb Merino, MD  Continuous Blood Gluc Sensor (DEXCOM G6 SENSOR) MISC Place 1 sensor into the skin every 10 days 01/27/22   Shamleffer, Melanie Crazier, MD  Continuous Blood Gluc Transmit (DEXCOM G6 TRANSMITTER) MISC  Change every 90 days 11/29/21   Shamleffer, Melanie Crazier, MD  dicyclomine (BENTYL) 20 MG tablet Take 1 tablet (20 mg total) by mouth 3 (three) times daily as needed for spasms. 11/08/21   Hosie Poisson, MD  gabapentin (NEURONTIN) 300 MG capsule Take 1 capsule (300 mg total) by mouth 3 (three) times daily. 12/30/21 03/30/22  Barb Merino, MD  hydrOXYzine (VISTARIL) 25 MG capsule Take 1 capsule (25 mg total) by mouth every 8 (eight) hours as needed. Patient taking differently: Take 25 mg by mouth every 8 (eight) hours as needed for anxiety. 07/31/21   Georgette Shell, MD  insulin detemir (LEVEMIR FLEXTOUCH) 100 UNIT/ML FlexPen Inject 16 Units into the skin daily. Patient taking differently: Inject 16 Units into the skin daily before breakfast. 06/14/21   Shelly Coss, MD  insulin regular (NOVOLIN R) 100 units/mL injection Inject 0-0.15 mLs (0-15 Units total) into the skin See admin instructions. Inject 0-15 units into the skin three times a day with meals, per sliding scale 02/17/22   Aline August, MD  losartan (COZAAR) 25 MG tablet Take 25 mg by mouth daily. 01/25/22   [provider]  metoCLOPramide (REGLAN) 10 MG tablet Take 1 tablet (10 mg total) by mouth 3 (three) times daily before meals. 02/17/22   Aline August, MD  ondansetron (ZOFRAN) 4 MG tablet Take 4 mg by mouth every 8 (eight) hours as needed for nausea or vomiting.    [provider]  pantoprazole (PROTONIX) 40 MG tablet Take 1 tablet (40 mg total) by mouth 2 (  two) times daily before a meal. 11/08/21   Kathlen Mody, MD  insulin aspart (NOVOLOG) 100 UNIT/ML FlexPen Inject 5 Units into the skin 3 (three) times daily with meals. 02/23/18 07/05/19  Jerald Kief, MD      Allergies    Lisinopril    Review of Systems   Review of Systems  Gastrointestinal:  Positive for abdominal pain and vomiting.    Physical Exam Updated Vital Signs BP 126/74 (BP Location: Right Arm)   Pulse (!) 114   Temp 98.3 F (36.8 C)  (Oral)   Resp 16   LMP  (LMP Unknown)   SpO2 99%   Constitutional: Uncomfortable and in pain female Cardiovascular: Tachycardic, regular rhythm  Pulmonary: Lungs clear to auscultation  Abdominal: Diffuse tenderness to palpation, normoactive bowel sounds ED Results / Procedures / Treatments   Labs (all labs ordered are listed, but only abnormal results are displayed) Labs Reviewed  COMPREHENSIVE METABOLIC PANEL - Abnormal; Notable for the following components:      Result Value   Sodium 134 (*)    Chloride 93 (*)    CO2 21 (*)    Glucose, Bld 429 (*)    BUN 22 (*)    Creatinine, Ser 1.28 (*)    Albumin 3.3 (*)    GFR, Estimated 58 (*)    Anion gap 20 (*)    All other components within normal limits  CBC - Abnormal; Notable for the following components:   Platelets 534 (*)    All other components within normal limits  BLOOD GAS, VENOUS - Abnormal; Notable for the following components:   pCO2, Ven 36 (*)    All other components within normal limits  BETA-HYDROXYBUTYRIC ACID - Abnormal; Notable for the following components:   Beta-Hydroxybutyric Acid 5.69 (*)    All other components within normal limits  CBG MONITORING, ED - Abnormal; Notable for the following components:   Glucose-Capillary 400 (*)    All other components within normal limits  LIPASE, BLOOD  PH, GASTRIC FLUID (GASTROCCULT CARD)  HCG, QUANTITATIVE, PREGNANCY  OCCULT BLOOD GASTRIC / DUODENUM (SPECIMEN CUP)  BASIC METABOLIC PANEL  BASIC METABOLIC PANEL  BASIC METABOLIC PANEL  BASIC METABOLIC PANEL  I-STAT BETA HCG BLOOD, ED ([facility], [facility], [facility] ONLY)    EKG EKG Interpretation  Date/Time:  Tuesday March 04 2022 02:37:30 EST Ventricular Rate:  127 PR Interval:  161 QRS Duration: 78 QT Interval:  297 QTC Calculation: 432 R Axis:   65 Text Interpretation: Sinus tachycardia LAE, consider biatrial enlargement Abnormal Q suggests anterior infarct Nonspecific T abnormalities, lateral leads No significant change  since last tracing Confirmed by Jacalyn Lefevre [phone]) on 03/04/2022 9:23:54 AM  Radiology No results found.   Medications Ordered in ED Medications  lactated ringers bolus 1,680 mL (has no administration in time range)  insulin regular, human (MYXREDLIN) 100 units/ 100 mL infusion (has no administration in time range)  lactated ringers infusion (has no administration in time range)  dextrose 5 % in lactated ringers infusion (has no administration in time range)  dextrose 50 % solution 0-50 mL (has no administration in time range)  potassium chloride 10 mEq in 100 mL IVPB (has no administration in time range)  sodium chloride 0.9 % bolus 1,000 mL (has no administration in time range)  atorvastatin (LIPITOR) tablet 20 mg (has no administration in time range)  carvedilol (COREG) tablet 6.25 mg (has no administration in time range)  heparin injection 5,000 Units (has no administration in  time range)  metoCLOPramide (REGLAN) injection 10 mg (has no administration in time range)  ondansetron (ZOFRAN-ODT) disintegrating tablet 4 mg (4 mg Oral Given 03/04/22 0235)  acetaminophen (TYLENOL) tablet 650 mg (650 mg Oral Given 03/04/22 0235)  sodium chloride 0.9 % bolus 1,000 mL (1,000 mLs Intravenous New Bag/Given 03/04/22 1149)  HYDROmorphone (DILAUDID) injection 0.5 mg (0.5 mg Intramuscular Given 03/04/22 1031)  droperidol (INAPSINE) 2.5 MG/ML injection 1.25 mg (1.25 mg Intramuscular Given 03/04/22 1030)  droperidol (INAPSINE) 2.5 MG/ML injection 1.25 mg (1.25 mg Intravenous Given 03/04/22 1248)  HYDROmorphone (DILAUDID) injection 0.5 mg (0.5 mg Intravenous Given 03/04/22 1248)    ED Course/ Medical Decision Making/ A&P                           Medical Decision Making  Intractable Nausea/vomiting Pt presents in acute distress with severe abdominal pain and active vomiting. Per pt, this is similar to gastroparesis episode on 02/16/22. Her last meal was two days ago, and she is unable to tolerate any  PO intake including her medications. Will need to rule out DKA, as CMP revealed elevated glucose above 400, and she has a metabolic acidosis with an anion gap. VBG shows no signs of acidosis, and B-Hydroxy level with normal limits, so less suspicious for DKA.  Less concerned for a bowel obstruction at this moment with normal bowel movements. Lipase level within normal limits as well. She has been giving Dilaudid for pain management, as well as inapsine for nausea/vomiting management.  Beta-hydroxy acid elevated at 5.67, could be signs early ketosis. Endotool has been ordered. Due to intractable nausea and vomiting, and increased beta-hydroxy with an anion gap, patient should be admitted. Discussed case with hospitalist team and they have accepted admission.      Amount and/or Complexity of Data Reviewed Labs: ordered.  Risk OTC drugs. Prescription drug management.     Final Clinical Impression(s) / ED Diagnoses Final diagnoses:  None  DKA 2/2 Intractable Vomiting in the setting of Gastroperesis  Rx / DC Orders ED Discharge Orders     None         Gust Eugene, Marlene Lard, MD 03/04/22 1359    Isla Pence, MD 03/04/22 1545

## 2022-03-04 NOTE — ED Notes (Signed)
Registration staff advise pt is requesting v/s be retaken as she feels she cannot breathe. V/s as noted. Pt then agreeable for RN to attempt bloodwork. While out of room, pt visualized sticking fingers down her throat to induce vomiting. RN aware.

## 2022-03-04 NOTE — H&P (Signed)
History and Physical    Kelli Hudson DPO:[id number] DOB: 12-29-1990 DOA: 03/04/2022  PCP: Loyola Mast, MD   Patient coming from: Home    Chief Complaint: Abdominal pain, nausea, vomiting.  HPI: Kelli Hudson is a 32 y.o. female with medical history significant of diabetes type 1, severe diabetic gastroparesis, diabetic neuropathy, hypertension who presents from home with complaints of severe nausea and vomiting that started about that started 2 days ago.  Patient was recently hospitalized at the end of December for the same.  Numerous hospitalization before that for the same. Patient states she is compliant with home medications including insulin.  She says her sugar has been running high recently.She says her abdominal pain abruptly started with nausea and vomiting. On presentation, she was complaining of abdominal pain.  She was tachycardic.  Lab work showed creatinine of 1.28, anion gap of 20, glucose in the range of 400s, elevated beta hydroxybutyric acid. Patient seen and examined at bedside today in ED.  Hemodynamically stable.  In mild sinus tachycardia.BP was on the softer side. She denies any H/O chest pain, SOB,diarrhoea,dysuria,hematochezia or malena. She lives with her mother . She says she hasn't been able to see her doctors because of her insurance problem.  ED Course: We were asked to admit the patient for management of DKA, diabetic gastroparesis.  Started on insulin drip, IV fluids.  Review of Systems: As per HPI otherwise 10 point review of systems negative.    Past Medical History:  Diagnosis Date   Acute H. pylori gastric ulcer    Coffee ground emesis    Diabetes mellitus (HCC)    Diabetic gastroparesis (HCC)    DKA (diabetic ketoacidosis) (HCC) 02/24/2021   Gastroparesis    GERD (gastroesophageal reflux disease)    Hypertension    Hyperthyroidism    Intractable nausea and vomiting 04/20/2021   Normocytic anemia 04/20/2020   Prolonged Q-T  interval on ECG     Past Surgical History:  Procedure Laterality Date   AMPUTATION TOE Left 03/10/2018   Procedure: AMPUTATION FIFTH TOE;  Surgeon: Vivi Barrack, DPM;  Location: [facility];  Service: Podiatry;  Laterality: Left;   BIOPSY  01/28/2020   Procedure: BIOPSY;  Surgeon: Kathi Der, MD;  Location: [facility];  Service: Gastroenterology;;   BOTOX INJECTION  08/26/2021   Procedure: BOTOX INJECTION;  Surgeon: Sherrilyn Rist, MD;  Location: [facility];  Service: Gastroenterology;;   ESOPHAGOGASTRODUODENOSCOPY N/A 01/28/2020   Procedure: ESOPHAGOGASTRODUODENOSCOPY (EGD);  Surgeon: Kathi Der, MD;  Location: [facility];  Service: Gastroenterology;  Laterality: N/A;   ESOPHAGOGASTRODUODENOSCOPY (EGD) WITH PROPOFOL Left 09/08/2015   Procedure: ESOPHAGOGASTRODUODENOSCOPY (EGD) WITH PROPOFOL;  Surgeon: Willis Modena, MD;  Location: [facility];  Service: Endoscopy;  Laterality: Left;   ESOPHAGOGASTRODUODENOSCOPY (EGD) WITH PROPOFOL N/A 08/26/2021   Procedure: ESOPHAGOGASTRODUODENOSCOPY (EGD) WITH PROPOFOL;  Surgeon: Sherrilyn Rist, MD;  Location: [facility];  Service: Gastroenterology;  Laterality: N/A;   WISDOM TOOTH EXTRACTION       reports that she has never smoked. She has never used smokeless tobacco. She reports current alcohol use of about 1.0 standard drink of alcohol per week. She reports that she does not use drugs.  Allergies  Allergen Reactions   Lisinopril Cough    breakout    Family History  Problem Relation Age of Onset   Lung cancer Mother    Diabetes Mother    Bipolar disorder Father    Liver cancer Maternal Grandfather    Diabetes  Maternal Aunt        x 2   Pancreatic cancer Maternal Uncle    Prostate cancer Maternal Uncle    Breast cancer Other        maternal great aunt   Heart disease Other    Ovarian cancer Other        maternal great aunt   Kidney disease Other        maternal great aunt   Colon cancer  Neg Hx    Stomach cancer Neg Hx      Prior to Admission medications   Medication Sig Start Date End Date Taking? Authorizing Provider  atorvastatin (LIPITOR) 20 MG tablet Take 1 tablet (20 mg total) by mouth daily. 10/29/21   [name], MD  carvedilol (COREG) 12.5 MG tablet Take 0.5 tablets (6.25 mg total) by mouth 2 (two) times daily with a meal. 12/30/21   Barb Merino, MD  Continuous Blood Gluc Sensor (DEXCOM G6 SENSOR) MISC Place 1 sensor into the skin every 10 days 01/27/22   Shamleffer, Melanie Crazier, MD  Continuous Blood Gluc Transmit (DEXCOM G6 TRANSMITTER) MISC Change every 90 days 11/29/21   Shamleffer, Melanie Crazier, MD  dicyclomine (BENTYL) 20 MG tablet Take 1 tablet (20 mg total) by mouth 3 (three) times daily as needed for spasms. 11/08/21   Hosie Poisson, MD  gabapentin (NEURONTIN) 300 MG capsule Take 1 capsule (300 mg total) by mouth 3 (three) times daily. 12/30/21 03/30/22  Barb Merino, MD  hydrOXYzine (VISTARIL) 25 MG capsule Take 1 capsule (25 mg total) by mouth every 8 (eight) hours as needed. Patient taking differently: Take 25 mg by mouth every 8 (eight) hours as needed for anxiety. 07/31/21   Georgette Shell, MD  insulin detemir (LEVEMIR FLEXTOUCH) 100 UNIT/ML FlexPen Inject 16 Units into the skin daily. Patient taking differently: Inject 16 Units into the skin daily before breakfast. 06/14/21   Shelly Coss, MD  insulin regular (NOVOLIN R) 100 units/mL injection Inject 0-0.15 mLs (0-15 Units total) into the skin See admin instructions. Inject 0-15 units into the skin three times a day with meals, per sliding scale 02/17/22   Aline August, MD  losartan (COZAAR) 25 MG tablet Take 25 mg by mouth daily. 01/25/22   [provider]  metoCLOPramide (REGLAN) 10 MG tablet Take 1 tablet (10 mg total) by mouth 3 (three) times daily before meals. 02/17/22   Aline August, MD  ondansetron (ZOFRAN) 4 MG tablet Take 4 mg by mouth every 8 (eight) hours as needed  for nausea or vomiting.    [provider]  pantoprazole (PROTONIX) 40 MG tablet Take 1 tablet (40 mg total) by mouth 2 (two) times daily before a meal. 11/08/21   Hosie Poisson, MD  insulin aspart (NOVOLOG) 100 UNIT/ML FlexPen Inject 5 Units into the skin 3 (three) times daily with meals. 02/23/18 07/05/19  Donne Hazel, MD    Physical Exam: Vitals:   03/04/22 1009 03/04/22 1245 03/04/22 1257 03/04/22 1257  BP: (!) 176/101 (!) 183/124  126/74  Pulse: (!) 135 (!) 131  (!) 114  Resp: 17 17  16   Temp:   98.6 F (37 C) 98.3 F (36.8 C)  TempSrc:   Oral Oral  SpO2: 100% 100%  99%    Constitutional: weak,in mild distress due to abd pain Vitals:   03/04/22 1009 03/04/22 1245 03/04/22 1257 03/04/22 1257  BP: (!) 176/101 (!) 183/124  126/74  Pulse: (!) 135 (!) 131  (!) 114  Resp: 17 17  16   Temp:   98.6 F (37 C) 98.3 F (36.8 C)  TempSrc:   Oral Oral  SpO2: 100% 100%  99%   Eyes: PERRL, lids and conjunctivae normal ENMT: Mucous membranes are dry.  Neck: normal, supple, no masses, no thyromegaly Respiratory: clear to auscultation bilaterally, no wheezing, no crackles. Normal respiratory effort. No accessory muscle use.  Cardiovascular: sinus tachycardia, no murmurs / rubs / gallops. No extremity edema.  Abdomen: no tenderness, no masses palpated. No hepatosplenomegaly. Bowel sounds positive.  Musculoskeletal: no clubbing / cyanosis. No joint deformity upper and lower extremities.  Skin: no rashes, lesions, ulcers. No induration Neurologic: CN 2-12 grossly intact.  Strength 5/5 in all 4.  Psychiatric: Normal judgment and insight. Alert and oriented x 3. Normal mood.   Foley Catheter:None  Labs on Admission: I have personally reviewed following labs and imaging studies  CBC: Recent Labs  Lab 03/04/22 0714  WBC 9.5  HGB 12.7  HCT 39.2  MCV 86.7  PLT 534*   Basic Metabolic Panel: Recent Labs  Lab 03/04/22 0714  NA 134*  K 4.2  CL 93*  CO2 21*  GLUCOSE 429*   BUN 22*  CREATININE 1.28*  CALCIUM 9.9   GFR: CrCl cannot be calculated (Unknown ideal weight.). Liver Function Tests: Recent Labs  Lab 03/04/22 0714  AST 22  ALT 19  ALKPHOS 103  BILITOT 1.1  PROT 7.8  ALBUMIN 3.3*   Recent Labs  Lab 03/04/22 0714  LIPASE 30   No results for input(s): "AMMONIA" in the last 168 hours. Coagulation Profile: No results for input(s): "INR", "PROTIME" in the last 168 hours. Cardiac Enzymes: No results for input(s): "CKTOTAL", "CKMB", "CKMBINDEX", "TROPONINI" in the last 168 hours. BNP (last 3 results) No results for input(s): "PROBNP" in the last 8760 hours. HbA1C: No results for input(s): "HGBA1C" in the last 72 hours. CBG: Recent Labs  Lab 03/04/22 1255  GLUCAP 400*   Lipid Profile: No results for input(s): "CHOL", "HDL", "LDLCALC", "TRIG", "CHOLHDL", "LDLDIRECT" in the last 72 hours. Thyroid Function Tests: No results for input(s): "TSH", "T4TOTAL", "FREET4", "T3FREE", "THYROIDAB" in the last 72 hours. Anemia Panel: No results for input(s): "VITAMINB12", "FOLATE", "FERRITIN", "TIBC", "IRON", "RETICCTPCT" in the last 72 hours. Urine analysis:    Component Value Date/Time   COLORURINE YELLOW 02/16/2022 0245   APPEARANCEUR HAZY (A) 02/16/2022 0245   LABSPEC 1.012 02/16/2022 0245   PHURINE 5.0 02/16/2022 0245   GLUCOSEU >=500 (A) 02/16/2022 0245   GLUCOSEU NEGATIVE 05/22/2020 1417   HGBUR SMALL (A) 02/16/2022 0245   BILIRUBINUR NEGATIVE 02/16/2022 0245   KETONESUR 20 (A) 02/16/2022 0245   PROTEINUR >=300 (A) 02/16/2022 0245   UROBILINOGEN 0.2 05/22/2020 1417   NITRITE NEGATIVE 02/16/2022 0245   LEUKOCYTESUR NEGATIVE 02/16/2022 0245    Radiological Exams on Admission: No results found.   Assessment/Plan Principal Problem:   DKA (diabetic ketoacidosis) (HCC) Active Problems:   Diabetic gastroparesis associated with type 1 diabetes mellitus (HCC)   Intractable nausea and vomiting   Sinus tachycardia   Hypertension  associated with diabetes (HCC)   DM type 1 (diabetes mellitus, type 1) (HCC)   HLD (hyperlipidemia)   DKA/uncontrolled diabetes mellitus type 1: Has been admitted several times in the past for this.  Takes insulin at home.  Reports that she is compliant. She was last admitted at the end of December during which she was managed for intractable nausea and vomiting secondary to severe gastroparesis. Elevated anion gap, hyperglycemia.  Started on DKA protocol.  Insulin drip started, monitor BMP every 4 hours. Recent hemoglobin A1c in the range of 9.  Diabetic coordinator consulted.  Intractable nausea/vomiting: Secondary to severe diabetic gastroparesis.  Has been admitted several times for this in the past.  Continue Reglan 10 mg on a scheduled basis for now.  Continue IV fluids. She has been referred to tertiary centers by gastroenterology in the past but apparently she has declined them as per the report. Continue PPI. She has been educated to take small volume frequent non fatty meals to prevent the trigger for gastroparesis  Hypertension: Takes Coreg, losartan at home.  BP soft. Hold coreg for now. Losartan on hold due to AKI.  AKI: Most likely secondary to volume loss from vomiting.  Continue gentle IV fluids.  Monitor BMP.  Hyperlipidemia: Continue statin  Sinus tachycardia: On Coreg at home ,continue iv fluids.  Looks like this is a chronic problem for her.  Diabetic polyneuropathy: On gabapentin  Anxiety: On Vistaril as needed.        Severity of Illness: The appropriate patient status for this patient is OBSERVATION.  DVT prophylaxis: heparin [place] Code Status: Full Family Communication: None at bedside Consults called: None     Shelly Coss MD [facility]  03/04/2022, 2:03 PM

## 2022-03-05 ENCOUNTER — Encounter (HOSPITAL_COMMUNITY): Payer: Self-pay | Admitting: Internal Medicine

## 2022-03-05 ENCOUNTER — Telehealth: Payer: Self-pay

## 2022-03-05 ENCOUNTER — Telehealth: Payer: Self-pay | Admitting: Pharmacist

## 2022-03-05 ENCOUNTER — Encounter: Payer: Self-pay | Admitting: Family Medicine

## 2022-03-05 DIAGNOSIS — Z794 Long term (current) use of insulin: Secondary | ICD-10-CM | POA: Diagnosis not present

## 2022-03-05 DIAGNOSIS — Z888 Allergy status to other drugs, medicaments and biological substances status: Secondary | ICD-10-CM | POA: Diagnosis not present

## 2022-03-05 DIAGNOSIS — E1010 Type 1 diabetes mellitus with ketoacidosis without coma: Secondary | ICD-10-CM | POA: Diagnosis not present

## 2022-03-05 DIAGNOSIS — R000 Tachycardia, unspecified: Secondary | ICD-10-CM | POA: Diagnosis present

## 2022-03-05 DIAGNOSIS — Z833 Family history of diabetes mellitus: Secondary | ICD-10-CM | POA: Diagnosis not present

## 2022-03-05 DIAGNOSIS — Z79899 Other long term (current) drug therapy: Secondary | ICD-10-CM | POA: Diagnosis not present

## 2022-03-05 DIAGNOSIS — E1022 Type 1 diabetes mellitus with diabetic chronic kidney disease: Secondary | ICD-10-CM | POA: Diagnosis present

## 2022-03-05 DIAGNOSIS — E1110 Type 2 diabetes mellitus with ketoacidosis without coma: Secondary | ICD-10-CM | POA: Diagnosis not present

## 2022-03-05 DIAGNOSIS — Z89422 Acquired absence of other left toe(s): Secondary | ICD-10-CM | POA: Diagnosis not present

## 2022-03-05 DIAGNOSIS — N182 Chronic kidney disease, stage 2 (mild): Secondary | ICD-10-CM | POA: Diagnosis present

## 2022-03-05 DIAGNOSIS — E0590 Thyrotoxicosis, unspecified without thyrotoxic crisis or storm: Secondary | ICD-10-CM | POA: Diagnosis present

## 2022-03-05 DIAGNOSIS — E1043 Type 1 diabetes mellitus with diabetic autonomic (poly)neuropathy: Secondary | ICD-10-CM

## 2022-03-05 DIAGNOSIS — N179 Acute kidney failure, unspecified: Secondary | ICD-10-CM | POA: Diagnosis present

## 2022-03-05 DIAGNOSIS — E1042 Type 1 diabetes mellitus with diabetic polyneuropathy: Secondary | ICD-10-CM | POA: Diagnosis present

## 2022-03-05 DIAGNOSIS — I152 Hypertension secondary to endocrine disorders: Secondary | ICD-10-CM | POA: Diagnosis present

## 2022-03-05 DIAGNOSIS — K3184 Gastroparesis: Secondary | ICD-10-CM | POA: Diagnosis present

## 2022-03-05 DIAGNOSIS — E1069 Type 1 diabetes mellitus with other specified complication: Secondary | ICD-10-CM | POA: Diagnosis present

## 2022-03-05 DIAGNOSIS — F419 Anxiety disorder, unspecified: Secondary | ICD-10-CM | POA: Diagnosis present

## 2022-03-05 DIAGNOSIS — E785 Hyperlipidemia, unspecified: Secondary | ICD-10-CM | POA: Diagnosis present

## 2022-03-05 DIAGNOSIS — K219 Gastro-esophageal reflux disease without esophagitis: Secondary | ICD-10-CM | POA: Diagnosis present

## 2022-03-05 LAB — BASIC METABOLIC PANEL
Anion gap: 8 (ref 5–15)
Anion gap: 8 (ref 5–15)
BUN: 21 mg/dL — ABNORMAL HIGH (ref 6–20)
BUN: 20 mg/dL (ref 6–20)
CO2: 25 mmol/L (ref 22–32)
CO2: 24 mmol/L (ref 22–32)
Calcium: 8 mg/dL — ABNORMAL LOW (ref 8.9–10.3)
Calcium: 8.7 mg/dL — ABNORMAL LOW (ref 8.9–10.3)
Chloride: 103 mmol/L (ref 98–111)
Chloride: 105 mmol/L (ref 98–111)
Creatinine, Ser: 1.19 mg/dL — ABNORMAL HIGH (ref 0.44–1.00)
Creatinine, Ser: 1.22 mg/dL — ABNORMAL HIGH (ref 0.44–1.00)
GFR, Estimated: >60 mL/min (ref >60)
GFR, Estimated: >60 mL/min (ref >60)
Glucose, Bld: 156 mg/dL — ABNORMAL HIGH (ref 70–99)
Glucose, Bld: 212 mg/dL — ABNORMAL HIGH (ref 70–99)
Potassium: 3.8 mmol/L (ref 3.5–5.1)
Potassium: 4.2 mmol/L (ref 3.5–5.1)
Sodium: 136 mmol/L (ref 135–145)
Sodium: 137 mmol/L (ref 135–145)

## 2022-03-05 LAB — CBC
HCT: 26.7 % — ABNORMAL LOW (ref 36.0–46.0)
Hemoglobin: 8.6 g/dL — ABNORMAL LOW (ref 12.0–15.0)
MCH: 28.6 pg (ref 26.0–34.0)
MCHC: 32.2 g/dL (ref 30.0–36.0)
MCV: 88.7 fL (ref 80.0–100.0)
Platelets: 370 10*3/uL (ref 150–400)
RBC: 3.01 MIL/uL — ABNORMAL LOW (ref 3.87–5.11)
RDW: 13.5 % (ref 11.5–15.5)
WBC: 12 10*3/uL — ABNORMAL HIGH (ref 4.0–10.5)
nRBC: 0 % (ref 0.0–0.2)

## 2022-03-05 LAB — CBG MONITORING, ED
Glucose-Capillary: 132 mg/dL — ABNORMAL HIGH (ref 70–99)
Glucose-Capillary: 140 mg/dL — ABNORMAL HIGH (ref 70–99)
Glucose-Capillary: 143 mg/dL — ABNORMAL HIGH (ref 70–99)
Glucose-Capillary: 148 mg/dL — ABNORMAL HIGH (ref 70–99)
Glucose-Capillary: 162 mg/dL — ABNORMAL HIGH (ref 70–99)
Glucose-Capillary: 208 mg/dL — ABNORMAL HIGH (ref 70–99)
Glucose-Capillary: 249 mg/dL — ABNORMAL HIGH (ref 70–99)

## 2022-03-05 LAB — GLUCOSE, CAPILLARY
Glucose-Capillary: 254 mg/dL — ABNORMAL HIGH (ref 70–99)
Glucose-Capillary: 203 mg/dL — ABNORMAL HIGH (ref 70–99)

## 2022-03-05 LAB — BETA-HYDROXYBUTYRIC ACID: Beta-Hydroxybutyric Acid: <0.05 mmol/L — ABNORMAL LOW (ref 0.05–0.27)

## 2022-03-05 MED ORDER — SODIUM CHLORIDE 0.9 % IV SOLN: INTRAVENOUS | Status: DC

## 2022-03-05 MED ORDER — INSULIN GLARGINE-YFGN 100 UNIT/ML ~~LOC~~ SOLN
5.0000 [IU] | Freq: Once | SUBCUTANEOUS | Status: AC
  Administered 2022-03-05: 5 [IU] via SUBCUTANEOUS
  Filled 2022-03-05: qty 0.05

## 2022-03-05 MED ORDER — INSULIN GLARGINE-YFGN 100 UNIT/ML ~~LOC~~ SOLN
16.0000 [IU] | Freq: Every day | SUBCUTANEOUS | Status: DC
  Administered 2022-03-05 – 2022-03-06 (×2): 16 [IU] via SUBCUTANEOUS
  Filled 2022-03-05 (×2): qty 0.16

## 2022-03-05 MED ORDER — HYDROXYZINE HCL 25 MG PO TABS
25.0000 mg | ORAL_TABLET | Freq: Three times a day (TID) | ORAL | Status: DC | PRN
  Administered 2022-03-05 (×2): 25 mg via ORAL
  Filled 2022-03-05 (×2): qty 1

## 2022-03-05 MED ORDER — HYDROMORPHONE HCL 1 MG/ML IJ SOLN
0.5000 mg | INTRAMUSCULAR | Status: DC | PRN
  Administered 2022-03-05 – 2022-03-06 (×3): 0.5 mg via INTRAVENOUS
  Filled 2022-03-05 (×3): qty 0.5

## 2022-03-05 MED ORDER — SODIUM CHLORIDE 0.9% FLUSH
3.0000 mL | Freq: Two times a day (BID) | INTRAVENOUS | Status: DC
  Administered 2022-03-05 – 2022-03-06 (×3): 3 mL via INTRAVENOUS

## 2022-03-05 MED ORDER — INSULIN ASPART 100 UNIT/ML IJ SOLN
0.0000 [IU] | Freq: Every day | INTRAMUSCULAR | Status: DC
  Administered 2022-03-05: 3 [IU] via SUBCUTANEOUS
  Filled 2022-03-05: qty 0.05

## 2022-03-05 MED ORDER — SODIUM CHLORIDE 0.9 % IV SOLN: 250.0000 mL | INTRAVENOUS | Status: DC | PRN

## 2022-03-05 MED ORDER — LABETALOL HCL 5 MG/ML IV SOLN: 10.0000 mg | INTRAVENOUS | Status: DC | PRN

## 2022-03-05 MED ORDER — LABETALOL HCL 5 MG/ML IV SOLN
20.0000 mg | Freq: Once | INTRAVENOUS | Status: AC
  Administered 2022-03-05: 20 mg via INTRAVENOUS
  Filled 2022-03-05: qty 4

## 2022-03-05 MED ORDER — SODIUM CHLORIDE 0.9% FLUSH: 3.0000 mL | INTRAVENOUS | Status: DC | PRN

## 2022-03-05 MED ORDER — INSULIN ASPART 100 UNIT/ML IJ SOLN
0.0000 [IU] | Freq: Three times a day (TID) | INTRAMUSCULAR | Status: DC
  Administered 2022-03-05: 3 [IU] via SUBCUTANEOUS
  Administered 2022-03-05 (×2): 5 [IU] via SUBCUTANEOUS
  Administered 2022-03-06: 3 [IU] via SUBCUTANEOUS
  Administered 2022-03-06: 5 [IU] via SUBCUTANEOUS
  Filled 2022-03-05: qty 0.15

## 2022-03-05 MED ORDER — CARVEDILOL 6.25 MG PO TABS
6.2500 mg | ORAL_TABLET | Freq: Two times a day (BID) | ORAL | Status: DC
  Administered 2022-03-05 – 2022-03-06 (×3): 6.25 mg via ORAL
  Filled 2022-03-05 (×2): qty 1
  Filled 2022-03-05: qty 2

## 2022-03-05 MED ORDER — LOSARTAN POTASSIUM 25 MG PO TABS
25.0000 mg | ORAL_TABLET | Freq: Every day | ORAL | Status: DC
  Administered 2022-03-05 – 2022-03-06 (×2): 25 mg via ORAL
  Filled 2022-03-05 (×2): qty 1

## 2022-03-05 NOTE — ED Notes (Signed)
Patient walking around ED independently.

## 2022-03-05 NOTE — Inpatient Diabetes Management (Signed)
Inpatient Diabetes Program Recommendations  AACE/ADA: New Consensus Statement on Inpatient Glycemic Control (2015)  Target Ranges:  Prepandial:   less than 140 mg/dL      Peak postprandial:   less than 180 mg/dL (1-2 hours)      Critically ill patients:  140 - 180 mg/dL   Lab Results  Component Value Date   GLUCAP 208 (H) 03/05/2022   HGBA1C 10.3 (H) 10/06/2021    Review of Glycemic Control  Diabetes history: DM1 Outpatient Diabetes medications: Levemir 16 QHS, Novolin R 0-15 TID with meals Current orders for Inpatient glycemic control: Semglee 16 QD, Novolog 0-15 TID with meals and 0-5 HS  HgbA1C - 10.3% (09/2021)  Inpatient Diabetes Program Recommendations:    Agree with orders.  If post-prandials > 180 mg/dL, add Novolog 3 units TID if eating > 50%.  Will see pt this am regarding her diabetes control, frequent admissions, f/u with Endo.  Thank you. Lorenda Peck, RD, LDN, [facility] Inpatient Diabetes Coordinator [phone]

## 2022-03-05 NOTE — Progress Notes (Signed)
PT arrived to floor pacing and agitated. Pt made herself vomit several times by sticking her finger down her throat. When asked why she was doing that pt told RN that she "had something stuck in her throat" PT has red mews score upon arrival due to high BP and Pulse. MD notified. Scheduled meds administered. Waiting on pharmacy to send up bp medications.

## 2022-03-05 NOTE — Progress Notes (Signed)
PROGRESS NOTE  Kelli Hudson  CBU:[id number] DOB: 1990/11/13 DOA: 03/04/2022 PCP: No primary care provider on file.   Brief Narrative: Kelli Hudson is a 32 y.o. female with medical history significant of diabetes type 1, severe diabetic gastroparesis, diabetic neuropathy, hypertension who presents from home with complaints of severe nausea and vomiting that started about that started 2 days ago.  Patient was recently hospitalized at the end of December for the same.  Numerous hospitalization before that for the same.  Lab work showed creatinine of 1.28, anion gap of 20, glucose in the range of 400s, elevated beta hydroxybutyric acid. Admitted for DKA  Assessment & Plan:  Principal Problem:   DKA (diabetic ketoacidosis) ([place]) Active Problems:   Diabetic gastroparesis associated with type 1 diabetes mellitus (HCC)   Intractable nausea and vomiting   Sinus tachycardia   Hypertension associated with diabetes ([place])   DM type 1 (diabetes mellitus, type 1) (HCC)   HLD (hyperlipidemia)   DKA/uncontrolled diabetes mellitus type 1: Has been admitted several times in the past for this.  Takes insulin at home.  Reports that she is compliant. She was last admitted at the end of December during which she was managed for intractable nausea and vomiting secondary to severe gastroparesis. Elevated anion gap, hyperglycemia on admission.  Started on DKA protocol. Recent hemoglobin A1c in the range of 9.  Diabetic coordinator consulted. Gap is closed.  She has been started on sliding scale, long-acting insulin   Intractable nausea/vomiting: Secondary to severe diabetic gastroparesis.  Has been admitted several times for this in the past.  Continue Reglan 10 mg on a scheduled basis for now.  Continue IV fluids. She has been referred to tertiary centers by gastroenterology in the past but apparently she has declined them as per the report. Continue PPI. She has been educated to take small  volume frequent non fatty meals to prevent the trigger for gastroparesis. Continues to complain of abdominal pain today.   Hypertension: Takes Coreg, losartan at home.  Restarted  CkD stage 2:  Her baseline creatinine has ranged from 1.2-1.4   Hyperlipidemia: Continue statin   Sinus tachycardia: On Coreg at home ,continue iv fluids.  Looks like this is a chronic problem for her.   Diabetic polyneuropathy: On gabapentin   Anxiety: On Vistaril as needed.        DVT prophylaxis:heparin injection 5,000 Units Start: 03/04/22 1400     Code Status: Full Code  Family Communication: None at bedside  Patient status:Inpatient  Patient is from :Home  Anticipated discharge EH:OZYY  Estimated DC date:tomorrow   Consultants: None  Procedures:None  Antimicrobials:  Anti-infectives (From admission, onward)    None       Subjective: Patient seen and examined at bedside today.  She feels better but she still has abdominal discomfort.  Diet has not been started yet.  Nausea and vomiting has improved.  She does not feel ready to go home today  Objective: Vitals:   03/05/22 0700 03/05/22 0800 03/05/22 0900 03/05/22 1030  BP: (!) 169/104 139/87 (!) 150/98   Pulse: (!) 114 (!) 103 (!) 109   Resp: 16 20 18    Temp:   98.5 F (36.9 C) 99 F (37.2 C)  TempSrc:    Oral  SpO2: 100% 100% 99%     Intake/Output Summary (Last 24 hours) at 03/05/2022 1124 Last data filed at 03/04/2022 1859 Gross per 24 hour  Intake 3300 ml  Output --  Net 3300 ml  There were no vitals filed for this visit.  Examination:  General exam: Overall comfortable, not in distress HEENT: PERRL Respiratory system:  no wheezes or crackles  Cardiovascular system: S1 & S2 heard, RRR.  Gastrointestinal system: Abdomen is nondistended, soft and mostly  Central nervous system: Alert and oriented Extremities: No edema, no clubbing ,no cyanosis Skin: No rashes, no ulcers,no icterus     Data Reviewed: I  have personally reviewed following labs and imaging studies  CBC: Recent Labs  Lab 03/04/22 0714 03/05/22 0250  WBC 9.5 12.0*  HGB 12.7 8.6*  HCT 39.2 26.7*  MCV 86.7 88.7  PLT 534* 606   Basic Metabolic Panel: Recent Labs  Lab 03/04/22 0714 03/04/22 1900 03/04/22 2310 03/05/22 0158 03/05/22 0646  NA 134* 134* 135 136 137  K 4.2 4.0 4.2 3.8 4.2  CL 93* 101 102 103 105  CO2 21* 23 24 25 24   GLUCOSE 429* 186* 202* 156* 212*  BUN 22* 23* 21* 21* 20  CREATININE 1.28* 1.28* 1.13* 1.19* 1.22*  CALCIUM 9.9 8.0* 8.1* 8.0* 8.7*     No results found for this or any previous visit (from the past 240 hour(s)).   Radiology Studies: No results found.  Scheduled Meds:  atorvastatin  20 mg Oral Daily   carvedilol  6.25 mg Oral BID WC   gabapentin  300 mg Oral TID   heparin  5,000 Units Subcutaneous Q8H   insulin aspart  0-15 Units Subcutaneous TID WC   insulin aspart  0-5 Units Subcutaneous QHS   insulin glargine-yfgn  16 Units Subcutaneous Daily   metoCLOPramide (REGLAN) injection  10 mg Intravenous Q6H   pantoprazole  40 mg Oral BID AC   sodium chloride flush  3 mL Intravenous Q12H   Continuous Infusions:  sodium chloride     sodium chloride 75 mL/hr at 03/05/22 0846     LOS: 0 days   Shelly Coss, MD [facility] P1/17/2024, 11:24 AM

## 2022-03-05 NOTE — Progress Notes (Signed)
Contacted patient regarding referral for diabetes and medication management from case management  Left patient a voicemail to return my call at their convenience. Will send [place], PharmD, [facility] [phone]

## 2022-03-05 NOTE — Patient Outreach (Signed)
  Care Coordination   Follow Up Visit Note   03/05/2022 Name: Havilah Topor MRN: [id number] DOB: 1990/12/31  Teana Lindahl is a 32 y.o. year old female who sees No primary care provider on file. for primary care.   What matters to the patients health and wellness today?  Patient referred to [facility] management team for follow up.      SDOH assessments and interventions completed:  No     Care Coordination Interventions:  No, not indicated   Follow up plan: No further intervention required.   Encounter Outcome:  Pt. Visit Completed   Quinn Plowman RN,BSN,CCM [place] [phone] direct line

## 2022-03-05 NOTE — ED Notes (Signed)
Pt reports emesis after breakfast

## 2022-03-05 NOTE — ED Notes (Signed)
MD in room to see patient.

## 2022-03-05 NOTE — ED Notes (Signed)
Pt complaining of nausea. PRN adminstered

## 2022-03-05 NOTE — Telephone Encounter (Signed)
Dismissal generated, unable to charge fee due to [employer].

## 2022-03-06 ENCOUNTER — Telehealth: Payer: Self-pay

## 2022-03-06 DIAGNOSIS — E1010 Type 1 diabetes mellitus with ketoacidosis without coma: Secondary | ICD-10-CM | POA: Diagnosis not present

## 2022-03-06 LAB — BASIC METABOLIC PANEL
Anion gap: 7 (ref 5–15)
BUN: 19 mg/dL (ref 6–20)
CO2: 24 mmol/L (ref 22–32)
Calcium: 8.6 mg/dL — ABNORMAL LOW (ref 8.9–10.3)
Chloride: 105 mmol/L (ref 98–111)
Creatinine, Ser: 1.26 mg/dL — ABNORMAL HIGH (ref 0.44–1.00)
GFR, Estimated: 59 mL/min — ABNORMAL LOW (ref >60)
Glucose, Bld: 208 mg/dL — ABNORMAL HIGH (ref 70–99)
Potassium: 4 mmol/L (ref 3.5–5.1)
Sodium: 136 mmol/L (ref 135–145)

## 2022-03-06 LAB — GLUCOSE, CAPILLARY
Glucose-Capillary: 178 mg/dL — ABNORMAL HIGH (ref 70–99)
Glucose-Capillary: 218 mg/dL — ABNORMAL HIGH (ref 70–99)

## 2022-03-06 MED ORDER — METOCLOPRAMIDE HCL 10 MG PO TABS: 10.0000 mg | ORAL_TABLET | Freq: Three times a day (TID) | ORAL | 0 refills | Status: DC | PRN

## 2022-03-06 MED ORDER — CARVEDILOL 12.5 MG PO TABS: 6.2500 mg | ORAL_TABLET | Freq: Two times a day (BID) | ORAL | 0 refills | Status: DC

## 2022-03-06 MED ORDER — LOSARTAN POTASSIUM 25 MG PO TABS: 25.0000 mg | ORAL_TABLET | Freq: Every day | ORAL | 0 refills | Status: DC

## 2022-03-06 MED ORDER — INSULIN REGULAR HUMAN 100 UNIT/ML IJ SOLN: 0.0000 [IU] | INTRAMUSCULAR | 1 refills | Status: DC

## 2022-03-06 MED ORDER — LEVEMIR FLEXTOUCH 100 UNIT/ML ~~LOC~~ SOPN: 16.0000 [IU] | PEN_INJECTOR | Freq: Every day | SUBCUTANEOUS | 0 refills | Status: DC

## 2022-03-06 NOTE — Discharge Summary (Signed)
Physician Discharge Summary  Kelli Hudson UVO:[id number] DOB: 01/12/1991 DOA: 03/04/2022  PCP: No primary care provider on file.  Admit date: 03/04/2022 Discharge date: 03/06/2022  Admitted From: Home Disposition:  Home  Discharge Condition:Stable CODE STATUS:FULL Diet recommendation: Carb Modified  Brief/Interim Summary: Kelli Hudson is a 32 y.o. female with medical history significant of diabetes type 1, severe diabetic gastroparesis, diabetic neuropathy, hypertension who presents from home with complaints of severe nausea and vomiting that started about that started 2 days ago.  Patient was recently hospitalized at the end of December for the same.  Numerous hospitalization before that for the same.  Lab work showed creatinine of 1.28, anion gap of 20, glucose in the range of 400s, elevated beta hydroxybutyric acid. Admitted for DKA .  Gap closed, started on long-acting and sliding scale.  Hospital course remarkable for nausea, vomiting now has improved and she is currently tolerating solid diet.  Remains comfortable this morning.  Medically stable for discharge today.  Following problems were addressed during the hospitalization:  DKA/uncontrolled diabetes mellitus type 1: Has been admitted several times in the past for this.  Takes insulin at home.  Reports that she is compliant. She was last admitted at the end of December during which she was managed for intractable nausea and vomiting secondary to severe gastroparesis. Elevated anion gap, hyperglycemia on admission.  Started on DKA protocol. Recent hemoglobin A1c in the range of 9. Gap is closed.  She has been started on sliding scale, long-acting insulin. We recommend to follow-up with her endocrinologist as an outpatient.   Intractable nausea/vomiting: Secondary to severe diabetic gastroparesis.  Has been admitted several times for this in the past.  Continue Reglan 10 mg on a scheduled basis for now.  Continue IV  fluids. She has been referred to tertiary centers by gastroenterology in the past but apparently she has declined them as per the report. Continue PPI. She has been educated to take small volume frequent non fatty meals to prevent the trigger for gastroparesis. No abdominal pain, nausea or vomiting today.   Hypertension: Takes Coreg, losartan at home.  Restarted   CkD stage 2:  Her baseline creatinine has ranged from 1.2-1.4   Hyperlipidemia: Continue statin   Sinus tachycardia: On Coreg at home .Looks like this is a chronic problem for her.   Diabetic polyneuropathy: On gabapentin   Anxiety: On Vistaril as needed.   Discharge Diagnoses:  Principal Problem:   DKA (diabetic ketoacidosis) (HCC) Active Problems:   Diabetic gastroparesis associated with type 1 diabetes mellitus (HCC)   Intractable nausea and vomiting   Sinus tachycardia   Hypertension associated with diabetes (HCC)   DM type 1 (diabetes mellitus, type 1) (HCC)   HLD (hyperlipidemia)    Discharge Instructions  Discharge Instructions     Diet Carb Modified   Complete by: As directed    Discharge instructions   Complete by: As directed    1)Please take prescribed medications as instructed 2)Follow up with your PCP and endocrinologist 3)Monitor your blood sugars at home 4)Take small-volume nonfatty frequent meals to prevent diabetic gastroparesis   Increase activity slowly   Complete by: As directed       Allergies as of 03/06/2022       Reactions   Lisinopril Cough   Breakout in rash        Medication List     TAKE these medications    atorvastatin 20 MG tablet Commonly known as: LIPITOR Take 1 tablet (20 mg  total) by mouth daily.   carvedilol 12.5 MG tablet Commonly known as: COREG Take 0.5 tablets (6.25 mg total) by mouth 2 (two) times daily with a meal.   Dexcom G6 Transmitter Misc Change every 90 days   dicyclomine 20 MG tablet Commonly known as: BENTYL Take 1 tablet (20 mg  total) by mouth 3 (three) times daily as needed for spasms.   gabapentin 300 MG capsule Commonly known as: NEURONTIN Take 1 capsule (300 mg total) by mouth 3 (three) times daily.   hydrOXYzine 25 MG capsule Commonly known as: VISTARIL Take 1 capsule (25 mg total) by mouth every 8 (eight) hours as needed. What changed: reasons to take this   insulin regular 100 units/mL injection Commonly known as: NovoLIN R Inject 0-0.15 mLs (0-15 Units total) into the skin See admin instructions. Inject 0-15 units into the skin three times a day with meals, per sliding scale   Levemir FlexTouch 100 UNIT/ML FlexPen Generic drug: insulin detemir Inject 16 Units into the skin daily. What changed: when to take this   losartan 25 MG tablet Commonly known as: COZAAR Take 1 tablet (25 mg total) by mouth daily.   metoCLOPramide 10 MG tablet Commonly known as: REGLAN Take 1 tablet (10 mg total) by mouth every 8 (eight) hours as needed for nausea. What changed:  when to take this reasons to take this   ondansetron 4 MG tablet Commonly known as: ZOFRAN Take 4 mg by mouth every 8 (eight) hours as needed for nausea or vomiting.   pantoprazole 40 MG tablet Commonly known as: PROTONIX Take 1 tablet (40 mg total) by mouth 2 (two) times daily before a meal.        Allergies  Allergen Reactions   Lisinopril Cough    Breakout in rash    Consultations: None   Procedures/Studies: No results found.    Subjective: Patient seen and examined at bedside today.  Hemodynamically stable for discharge.  No nausea, vomiting or abdominal pain.  Tolerating diet.  Discharge Exam: Vitals:   03/06/22 0507 03/06/22 0607  BP: (!) 151/101 (!) 141/93  Pulse: (!) 107 (!) 106  Resp: 20   Temp: 98.4 F (36.9 C)   SpO2: 96%    Vitals:   03/05/22 2027 03/06/22 0037 03/06/22 0507 03/06/22 0607  BP: (!) 115/58 123/70 (!) 151/101 (!) 141/93  Pulse: (!) 111 (!) 104 (!) 107 (!) 106  Resp: 18 18 20    Temp:  99.1 F (37.3 C) 98.2 F (36.8 C) 98.4 F (36.9 C)   TempSrc: Oral Oral Oral   SpO2: 100% 97% 96%   Weight:      Height:        General: Pt is alert, awake, not in acute distress Cardiovascular: RRR, S1/S2 +, no rubs, no gallops Respiratory: CTA bilaterally, no wheezing, no rhonchi Abdominal: Soft, NT, ND, bowel sounds + Extremities: no edema, no cyanosis    The results of significant diagnostics from this hospitalization (including imaging, microbiology, ancillary and laboratory) are listed below for reference.     Microbiology: No results found for this or any previous visit (from the past 240 hour(s)).   Labs: BNP (last 3 results) No results for input(s): "BNP" in the last 8760 hours. Basic Metabolic Panel: Recent Labs  Lab 03/04/22 1900 03/04/22 2310 03/05/22 0158 03/05/22 0646 03/06/22 0737  NA 134* 135 136 137 136  K 4.0 4.2 3.8 4.2 4.0  CL 101 102 103 105 105  CO2 23 24 25  24 24  GLUCOSE 186* 202* 156* 212* 208*  BUN 23* 21* 21* 20 19  CREATININE 1.28* 1.13* 1.19* 1.22* 1.26*  CALCIUM 8.0* 8.1* 8.0* 8.7* 8.6*   Liver Function Tests: Recent Labs  Lab 03/04/22 0714  AST 22  ALT 19  ALKPHOS 103  BILITOT 1.1  PROT 7.8  ALBUMIN 3.3*   Recent Labs  Lab 03/04/22 0714  LIPASE 30   No results for input(s): "AMMONIA" in the last 168 hours. CBC: Recent Labs  Lab 03/04/22 0714 03/05/22 0250  WBC 9.5 12.0*  HGB 12.7 8.6*  HCT 39.2 26.7*  MCV 86.7 88.7  PLT 534* 370   Cardiac Enzymes: No results for input(s): "CKTOTAL", "CKMB", "CKMBINDEX", "TROPONINI" in the last 168 hours. BNP: Invalid input(s): "POCBNP" CBG: Recent Labs  Lab 03/05/22 0730 03/05/22 1110 03/05/22 1639 03/05/22 2116 03/06/22 0741  GLUCAP 208* 249* 254* 203* 178*   D-Dimer No results for input(s): "DDIMER" in the last 72 hours. Hgb A1c No results for input(s): "HGBA1C" in the last 72 hours. Lipid Profile No results for input(s): "CHOL", "HDL", "LDLCALC", "TRIG",  "CHOLHDL", "LDLDIRECT" in the last 72 hours. Thyroid function studies No results for input(s): "TSH", "T4TOTAL", "T3FREE", "THYROIDAB" in the last 72 hours.  Invalid input(s): "FREET3" Anemia work up No results for input(s): "VITAMINB12", "FOLATE", "FERRITIN", "TIBC", "IRON", "RETICCTPCT" in the last 72 hours. Urinalysis    Component Value Date/Time   COLORURINE YELLOW 02/16/2022 0245   APPEARANCEUR HAZY (A) 02/16/2022 0245   LABSPEC 1.012 02/16/2022 0245   PHURINE 5.0 02/16/2022 0245   GLUCOSEU >=500 (A) 02/16/2022 0245   GLUCOSEU NEGATIVE 05/22/2020 1417   HGBUR SMALL (A) 02/16/2022 0245   BILIRUBINUR NEGATIVE 02/16/2022 0245   KETONESUR 20 (A) 02/16/2022 0245   PROTEINUR >=300 (A) 02/16/2022 0245   UROBILINOGEN 0.2 05/22/2020 1417   NITRITE NEGATIVE 02/16/2022 0245   LEUKOCYTESUR NEGATIVE 02/16/2022 0245   Sepsis Labs Recent Labs  Lab 03/04/22 0714 03/05/22 0250  WBC 9.5 12.0*   Microbiology No results found for this or any previous visit (from the past 240 hour(s)).  Please note: You were cared for by a hospitalist during your hospital stay. Once you are discharged, your primary care physician will handle any further medical issues. Please note that NO REFILLS for any discharge medications will be authorized once you are discharged, as it is imperative that you return to your primary care physician (or establish a relationship with a primary care physician if you do not have one) for your post hospital discharge needs so that they can reassess your need for medications and monitor your lab values.    Time coordinating discharge: 40 minutes  SIGNED:   Shelly Coss, MD  [facility] 03/06/2022, 10:39 AM Pager [phone]  If 7PM-7AM, please contact night-coverage [email or web address] Password [email or web address]

## 2022-03-06 NOTE — TOC Progression Note (Signed)
Transition of Care [facility]) - Progression Note    Patient Details  Name: Kelli Hudson MRN: [id number] Date of Birth: 1990-11-24  [facility] CM/SW Contact  Leeroy Cha, RN Phone Number: 03/06/2022, 10:53 AM  Clinical Narrative:     paTIENT discharged to return home with self care.  No toc needs present.  Expected Discharge Plan: Home/Self Care Barriers to Discharge: Barriers Resolved  Expected Discharge Plan and Services   Discharge Planning Services: CM Consult   Living arrangements for the past 2 months: Apartment Expected Discharge Date: 03/06/22                                     Social Determinants of Health (SDOH) Interventions SDOH Screenings   Food Insecurity: No Food Insecurity (03/05/2022)  Housing: Low Risk  (03/05/2022)  Transportation Needs: No Transportation Needs (03/05/2022)  Recent Concern: Transportation Needs - Unmet Transportation Needs (12/12/2021)  Utilities: Not At Risk (03/05/2022)  Alcohol Screen: Low Risk  (05/29/2021)  Depression (PHQ2-9): Low Risk  (05/29/2021)  Recent Concern: Depression (PHQ2-9) - Medium Risk (04/10/2021)  Financial Resource Strain: Low Risk  (05/29/2021)  Social Connections: Moderately Integrated (05/29/2021)  Tobacco Use: Low Risk  (03/05/2022)    Readmission Risk Interventions   Row Labels 12/12/2021    8:59 AM 12/04/2021    9:52 AM 11/06/2021   12:31 PM  Readmission Risk Prevention Plan   Section Header. No data exists in this row.     Transportation Screening   Complete Complete Complete  Medication Review [employer])   Complete Complete Complete  PCP or Specialist appointment within 3-5 days of discharge   Complete Complete Complete  HRI or [place]   Complete Complete Complete  SW Recovery Care/Counseling Consult   Complete Complete Complete  Palliative Care Screening   Not Applicable Not Applicable Not [place]   Not Applicable Not  Applicable Not Applicable

## 2022-03-06 NOTE — Inpatient Diabetes Management (Signed)
Inpatient Diabetes Program Recommendations  AACE/ADA: New Consensus Statement on Inpatient Glycemic Control (2015)  Target Ranges:  Prepandial:   less than 140 mg/dL      Peak postprandial:   less than 180 mg/dL (1-2 hours)      Critically ill patients:  140 - 180 mg/dL   Lab Results  Component Value Date   GLUCAP 218 (H) 03/06/2022   HGBA1C 10.3 (H) 10/06/2021    Review of Glycemic Control  Diabetes history: DM1 Outpatient Diabetes medications: Levemir 16 QD, Novolin R 0-15 TID Current orders for Inpatient glycemic control: Semglee 16 QD, Novolog 0-15 TID with meals and 0-5 HS   HgbA1C - 10.3% (10/06/21) 178, 218 mg/dL 429 when presented to ED on 03/04/22  Inpatient Diabetes Program Recommendations:    Spoke with pt at bedside regarding her diabetes and glucose control. Pt states she did not miss any insulin doses prior to hospitalization. Missed appt with specialist in [place] to look at getting a gastric pacemaker d/t being in the hospital. Also missed appt with PCP on 03/03/22. Dismissal was generated.  Pt states she is still hopeful to see Gastroenterologist in [place] for gastric pacemaker.   Pt uses Dexcom to monitor blood sugars. Needs updated HgbA1C, although missed PCP appt.  Encouraged pt to take care of herself and stressed importance of keeping blood sugars in control and abstaining from marijuana, to control gastroparesis.   No further questions/concerns.  Thank you. Lorenda Peck, RD, LDN, [place] Inpatient Diabetes Coordinator [phone]

## 2022-03-06 NOTE — Telephone Encounter (Signed)
..  Medicaid Managed Care Note  03/06/2022 Name: [name] MRN: [id number] DOB: [birth year]  Kelli Hudson is a 32 y.o. year old female who is a primary care patient of No primary care provider on file. and is actively engaged with the care management team. I reached out to [facility] by phone today to assist with scheduling an initial visit with the RN Case Manager  Follow up plan: Unsuccessful telephone outreach attempt made. A HIPAA compliant phone message was left for the patient providing contact information and requesting a return call.  The care management team will reach out to the patient again over the next 5 days.   [place]

## 2022-03-06 NOTE — Plan of Care (Signed)
  Problem: Education: Goal: Knowledge of General Education information will improve Description: Including pain rating scale, medication(s)/side effects and non-pharmacologic comfort measures 03/06/2022 1214 by Carney Harder, RN Outcome: Adequate for Discharge 03/06/2022 1214 by Carney Harder, RN Outcome: Progressing   Problem: Health Behavior/Discharge Planning: Goal: Ability to manage health-related needs will improve 03/06/2022 1214 by Carney Harder, RN Outcome: Adequate for Discharge 03/06/2022 1214 by Carney Harder, RN Outcome: Progressing   Problem: Clinical Measurements: Goal: Ability to maintain clinical measurements within normal limits will improve 03/06/2022 1214 by Carney Harder, RN Outcome: Adequate for Discharge 03/06/2022 1214 by Carney Harder, RN Outcome: Progressing Goal: Will remain free from infection 03/06/2022 1214 by Carney Harder, RN Outcome: Adequate for Discharge 03/06/2022 1214 by Carney Harder, RN Outcome: Progressing Goal: Diagnostic test results will improve 03/06/2022 1214 by Carney Harder, RN Outcome: Adequate for Discharge 03/06/2022 1214 by Carney Harder, RN Outcome: Progressing Goal: Respiratory complications will improve 03/06/2022 1214 by Carney Harder, RN Outcome: Adequate for Discharge 03/06/2022 1214 by Carney Harder, RN Outcome: Progressing Goal: Cardiovascular complication will be avoided 03/06/2022 1214 by Carney Harder, RN Outcome: Adequate for Discharge 03/06/2022 1214 by Carney Harder, RN Outcome: Progressing   Problem: Activity: Goal: Risk for activity intolerance will decrease 03/06/2022 1214 by Carney Harder, RN Outcome: Adequate for Discharge 03/06/2022 1214 by Carney Harder, RN Outcome: Progressing   Problem: Nutrition: Goal: Adequate nutrition will be maintained 03/06/2022 1214 by Carney Harder, RN Outcome: Adequate for Discharge 03/06/2022 1214 by Carney Harder, RN Outcome:  Progressing   Problem: Coping: Goal: Level of anxiety will decrease 03/06/2022 1214 by Carney Harder, RN Outcome: Adequate for Discharge 03/06/2022 1214 by Carney Harder, RN Outcome: Progressing   Problem: Elimination: Goal: Will not experience complications related to bowel motility 03/06/2022 1214 by Carney Harder, RN Outcome: Adequate for Discharge 03/06/2022 1214 by Carney Harder, RN Outcome: Progressing Goal: Will not experience complications related to urinary retention 03/06/2022 1214 by Carney Harder, RN Outcome: Adequate for Discharge 03/06/2022 1214 by Carney Harder, RN Outcome: Progressing   Problem: Pain Managment: Goal: General experience of comfort will improve 03/06/2022 1214 by Carney Harder, RN Outcome: Adequate for Discharge 03/06/2022 1214 by Carney Harder, RN Outcome: Progressing   Problem: Safety: Goal: Ability to remain free from injury will improve 03/06/2022 1214 by Carney Harder, RN Outcome: Adequate for Discharge 03/06/2022 1214 by Carney Harder, RN Outcome: Progressing   Problem: Skin Integrity: Goal: Risk for impaired skin integrity will decrease 03/06/2022 1214 by Carney Harder, RN Outcome: Adequate for Discharge 03/06/2022 1214 by Carney Harder, RN Outcome: Progressing

## 2022-03-21 ENCOUNTER — Encounter (HOSPITAL_COMMUNITY): Payer: Self-pay | Admitting: Internal Medicine

## 2022-03-21 ENCOUNTER — Inpatient Hospital Stay (HOSPITAL_COMMUNITY)
Admission: EM | Admit: 2022-03-21 | Discharge: 2022-03-25 | DRG: 074 | Disposition: A | Discharge status: Home / Self Care | Payer: Medicaid Other | Attending: Student | Admitting: Student

## 2022-03-21 DIAGNOSIS — Z833 Family history of diabetes mellitus: Secondary | ICD-10-CM

## 2022-03-21 DIAGNOSIS — Z800 Family history of malignant neoplasm of digestive organs: Secondary | ICD-10-CM

## 2022-03-21 DIAGNOSIS — E1022 Type 1 diabetes mellitus with diabetic chronic kidney disease: Secondary | ICD-10-CM | POA: Diagnosis present

## 2022-03-21 DIAGNOSIS — E10649 Type 1 diabetes mellitus with hypoglycemia without coma: Secondary | ICD-10-CM | POA: Diagnosis present

## 2022-03-21 DIAGNOSIS — Z79899 Other long term (current) drug therapy: Secondary | ICD-10-CM

## 2022-03-21 DIAGNOSIS — Z818 Family history of other mental and behavioral disorders: Secondary | ICD-10-CM

## 2022-03-21 DIAGNOSIS — Z8041 Family history of malignant neoplasm of ovary: Secondary | ICD-10-CM

## 2022-03-21 DIAGNOSIS — N179 Acute kidney failure, unspecified: Secondary | ICD-10-CM | POA: Diagnosis present

## 2022-03-21 DIAGNOSIS — D509 Iron deficiency anemia, unspecified: Secondary | ICD-10-CM | POA: Diagnosis present

## 2022-03-21 DIAGNOSIS — Z801 Family history of malignant neoplasm of trachea, bronchus and lung: Secondary | ICD-10-CM

## 2022-03-21 DIAGNOSIS — I152 Hypertension secondary to endocrine disorders: Secondary | ICD-10-CM | POA: Diagnosis present

## 2022-03-21 DIAGNOSIS — E1159 Type 2 diabetes mellitus with other circulatory complications: Secondary | ICD-10-CM | POA: Diagnosis present

## 2022-03-21 DIAGNOSIS — N182 Chronic kidney disease, stage 2 (mild): Secondary | ICD-10-CM | POA: Diagnosis present

## 2022-03-21 DIAGNOSIS — R112 Nausea with vomiting, unspecified: Secondary | ICD-10-CM | POA: Diagnosis not present

## 2022-03-21 DIAGNOSIS — R9431 Abnormal electrocardiogram [ECG] [EKG]: Secondary | ICD-10-CM | POA: Diagnosis present

## 2022-03-21 DIAGNOSIS — Z89422 Acquired absence of other left toe(s): Secondary | ICD-10-CM

## 2022-03-21 DIAGNOSIS — E1042 Type 1 diabetes mellitus with diabetic polyneuropathy: Secondary | ICD-10-CM | POA: Diagnosis present

## 2022-03-21 DIAGNOSIS — Z794 Long term (current) use of insulin: Secondary | ICD-10-CM

## 2022-03-21 DIAGNOSIS — E1143 Type 2 diabetes mellitus with diabetic autonomic (poly)neuropathy: Secondary | ICD-10-CM | POA: Diagnosis present

## 2022-03-21 DIAGNOSIS — Z803 Family history of malignant neoplasm of breast: Secondary | ICD-10-CM

## 2022-03-21 DIAGNOSIS — E1065 Type 1 diabetes mellitus with hyperglycemia: Secondary | ICD-10-CM | POA: Diagnosis present

## 2022-03-21 DIAGNOSIS — D72829 Elevated white blood cell count, unspecified: Secondary | ICD-10-CM | POA: Diagnosis present

## 2022-03-21 DIAGNOSIS — E1043 Type 1 diabetes mellitus with diabetic autonomic (poly)neuropathy: Principal | ICD-10-CM | POA: Diagnosis present

## 2022-03-21 DIAGNOSIS — E785 Hyperlipidemia, unspecified: Secondary | ICD-10-CM | POA: Diagnosis present

## 2022-03-21 DIAGNOSIS — E1069 Type 1 diabetes mellitus with other specified complication: Secondary | ICD-10-CM | POA: Diagnosis present

## 2022-03-21 DIAGNOSIS — I129 Hypertensive chronic kidney disease with stage 1 through stage 4 chronic kidney disease, or unspecified chronic kidney disease: Secondary | ICD-10-CM | POA: Diagnosis present

## 2022-03-21 DIAGNOSIS — R1010 Upper abdominal pain, unspecified: Secondary | ICD-10-CM

## 2022-03-21 DIAGNOSIS — Z888 Allergy status to other drugs, medicaments and biological substances status: Secondary | ICD-10-CM

## 2022-03-21 DIAGNOSIS — R1110 Vomiting, unspecified: Secondary | ICD-10-CM

## 2022-03-21 DIAGNOSIS — K3184 Gastroparesis: Secondary | ICD-10-CM | POA: Diagnosis present

## 2022-03-21 LAB — GLUCOSE, CAPILLARY: Glucose-Capillary: 282 mg/dL — ABNORMAL HIGH (ref 70–99)

## 2022-03-21 LAB — CBC WITH DIFFERENTIAL/PLATELET
Abs Immature Granulocytes: 0.02 10*3/uL (ref 0.00–0.07)
Basophils Absolute: 0 10*3/uL (ref 0.0–0.1)
Basophils Relative: 0 %
Eosinophils Absolute: 0 10*3/uL (ref 0.0–0.5)
Eosinophils Relative: 0 %
HCT: 38.1 % (ref 36.0–46.0)
Hemoglobin: 12.5 g/dL (ref 12.0–15.0)
Immature Granulocytes: 0 %
Lymphocytes Relative: 12 %
Lymphs Abs: 1.5 10*3/uL (ref 0.7–4.0)
MCH: 28.3 pg (ref 26.0–34.0)
MCHC: 32.8 g/dL (ref 30.0–36.0)
MCV: 86.2 fL (ref 80.0–100.0)
Monocytes Absolute: 0.3 10*3/uL (ref 0.1–1.0)
Monocytes Relative: 3 %
Neutro Abs: 10.5 10*3/uL — ABNORMAL HIGH (ref 1.7–7.7)
Neutrophils Relative %: 85 %
Platelets: 556 10*3/uL — ABNORMAL HIGH (ref 150–400)
RBC: 4.42 MIL/uL (ref 3.87–5.11)
RDW: 13.4 % (ref 11.5–15.5)
WBC: 12.3 10*3/uL — ABNORMAL HIGH (ref 4.0–10.5)
nRBC: 0 % (ref 0.0–0.2)

## 2022-03-21 LAB — COMPREHENSIVE METABOLIC PANEL
ALT: 21 U/L (ref 0–44)
AST: 27 U/L (ref 15–41)
Albumin: 3.6 g/dL (ref 3.5–5.0)
Alkaline Phosphatase: 113 U/L (ref 38–126)
Anion gap: 12 (ref 5–15)
BUN: 31 mg/dL — ABNORMAL HIGH (ref 6–20)
CO2: 24 mmol/L (ref 22–32)
Calcium: 10 mg/dL (ref 8.9–10.3)
Chloride: 100 mmol/L (ref 98–111)
Creatinine, Ser: 1.35 mg/dL — ABNORMAL HIGH (ref 0.44–1.00)
GFR, Estimated: 54 mL/min — ABNORMAL LOW (ref >60)
Glucose, Bld: 264 mg/dL — ABNORMAL HIGH (ref 70–99)
Potassium: 3.7 mmol/L (ref 3.5–5.1)
Sodium: 136 mmol/L (ref 135–145)
Total Bilirubin: 0.9 mg/dL (ref 0.3–1.2)
Total Protein: 8.8 g/dL — ABNORMAL HIGH (ref 6.5–8.1)

## 2022-03-21 LAB — BLOOD GAS, VENOUS
Acid-Base Excess: 2.7 mmol/L — ABNORMAL HIGH (ref 0.0–2.0)
Bicarbonate: 27.9 mmol/L (ref 20.0–28.0)
O2 Saturation: 37.7 %
Patient temperature: 37
pCO2, Ven: 44 mmHg (ref 44–60)
pH, Ven: 7.41 (ref 7.25–7.43)
pO2, Ven: <31 mmHg — CL (ref 32–45)

## 2022-03-21 LAB — CBG MONITORING, ED: Glucose-Capillary: 246 mg/dL — ABNORMAL HIGH (ref 70–99)

## 2022-03-21 LAB — LIPASE, BLOOD: Lipase: 30 U/L (ref 11–51)

## 2022-03-21 LAB — HCG, SERUM, QUALITATIVE: Preg, Serum: NEGATIVE

## 2022-03-21 MED ORDER — PANTOPRAZOLE SODIUM 40 MG IV SOLR
40.0000 mg | Freq: Once | INTRAVENOUS | Status: AC
  Administered 2022-03-21: 40 mg via INTRAVENOUS
  Filled 2022-03-21: qty 10

## 2022-03-21 MED ORDER — LACTATED RINGERS IV SOLN: INTRAVENOUS | Status: AC

## 2022-03-21 MED ORDER — ACETAMINOPHEN 650 MG RE SUPP: 650.0000 mg | Freq: Four times a day (QID) | RECTAL | Status: DC | PRN

## 2022-03-21 MED ORDER — SODIUM CHLORIDE 0.9 % IV BOLUS
1000.0000 mL | Freq: Once | INTRAVENOUS | Status: AC
  Administered 2022-03-21: 1000 mL via INTRAVENOUS

## 2022-03-21 MED ORDER — INSULIN ASPART 100 UNIT/ML IJ SOLN
0.0000 [IU] | INTRAMUSCULAR | Status: DC
  Administered 2022-03-21: 8 [IU] via SUBCUTANEOUS
  Administered 2022-03-22: 3 [IU] via SUBCUTANEOUS
  Administered 2022-03-22: 5 [IU] via SUBCUTANEOUS
  Administered 2022-03-22: 3 [IU] via SUBCUTANEOUS
  Administered 2022-03-22: 11 [IU] via SUBCUTANEOUS
  Administered 2022-03-22: 5 [IU] via SUBCUTANEOUS
  Administered 2022-03-23: 3 [IU] via SUBCUTANEOUS

## 2022-03-21 MED ORDER — LORAZEPAM 2 MG/ML IJ SOLN
1.0000 mg | Freq: Once | INTRAMUSCULAR | Status: AC
  Administered 2022-03-21: 1 mg via INTRAVENOUS
  Filled 2022-03-21: qty 1

## 2022-03-21 MED ORDER — METOCLOPRAMIDE HCL 5 MG/ML IJ SOLN
5.0000 mg | Freq: Once | INTRAMUSCULAR | Status: AC
  Administered 2022-03-21: 5 mg via INTRAVENOUS
  Filled 2022-03-21: qty 2

## 2022-03-21 MED ORDER — SENNOSIDES-DOCUSATE SODIUM 8.6-50 MG PO TABS: 1.0000 | ORAL_TABLET | Freq: Every evening | ORAL | Status: DC | PRN

## 2022-03-21 MED ORDER — LABETALOL HCL 5 MG/ML IV SOLN
10.0000 mg | INTRAVENOUS | Status: DC | PRN
  Administered 2022-03-21: 10 mg via INTRAVENOUS
  Filled 2022-03-21: qty 4

## 2022-03-21 MED ORDER — ENOXAPARIN SODIUM 40 MG/0.4ML IJ SOSY
40.0000 mg | PREFILLED_SYRINGE | INTRAMUSCULAR | Status: DC
  Administered 2022-03-21: 40 mg via SUBCUTANEOUS
  Filled 2022-03-21: qty 0.4

## 2022-03-21 MED ORDER — CARVEDILOL 6.25 MG PO TABS
6.2500 mg | ORAL_TABLET | Freq: Two times a day (BID) | ORAL | Status: DC
  Filled 2022-03-21: qty 1

## 2022-03-21 MED ORDER — DIPHENHYDRAMINE HCL 50 MG/ML IJ SOLN: 12.5000 mg | Freq: Four times a day (QID) | INTRAMUSCULAR | Status: DC | PRN

## 2022-03-21 MED ORDER — PANTOPRAZOLE SODIUM 40 MG PO TBEC
40.0000 mg | DELAYED_RELEASE_TABLET | Freq: Two times a day (BID) | ORAL | Status: DC
  Filled 2022-03-21: qty 1

## 2022-03-21 MED ORDER — HYDROMORPHONE HCL 1 MG/ML IJ SOLN
1.0000 mg | INTRAMUSCULAR | Status: AC | PRN
  Administered 2022-03-21 – 2022-03-22 (×2): 1 mg via INTRAVENOUS
  Filled 2022-03-21 (×2): qty 1

## 2022-03-21 MED ORDER — LABETALOL HCL 5 MG/ML IV SOLN
20.0000 mg | Freq: Once | INTRAVENOUS | Status: AC
  Administered 2022-03-21: 20 mg via INTRAVENOUS
  Filled 2022-03-21: qty 4

## 2022-03-21 MED ORDER — HYDROMORPHONE HCL 1 MG/ML IJ SOLN
1.0000 mg | Freq: Once | INTRAMUSCULAR | Status: AC
  Administered 2022-03-21: 1 mg via INTRAVENOUS
  Filled 2022-03-21: qty 1

## 2022-03-21 MED ORDER — INSULIN GLARGINE-YFGN 100 UNIT/ML ~~LOC~~ SOLN
16.0000 [IU] | Freq: Every day | SUBCUTANEOUS | Status: DC
  Filled 2022-03-21 (×3): qty 0.16

## 2022-03-21 MED ORDER — TRIMETHOBENZAMIDE HCL 100 MG/ML IM SOLN
200.0000 mg | Freq: Four times a day (QID) | INTRAMUSCULAR | Status: DC | PRN
  Administered 2022-03-22: 200 mg via INTRAMUSCULAR
  Filled 2022-03-21 (×2): qty 2

## 2022-03-21 MED ORDER — GABAPENTIN 300 MG PO CAPS
300.0000 mg | ORAL_CAPSULE | Freq: Three times a day (TID) | ORAL | Status: DC
  Administered 2022-03-22 – 2022-03-25 (×10): 300 mg via ORAL
  Filled 2022-03-21 (×10): qty 1

## 2022-03-21 MED ORDER — SODIUM CHLORIDE 0.9% FLUSH
3.0000 mL | Freq: Two times a day (BID) | INTRAVENOUS | Status: DC
  Administered 2022-03-21 – 2022-03-25 (×7): 3 mL via INTRAVENOUS

## 2022-03-21 MED ORDER — ACETAMINOPHEN 325 MG PO TABS
650.0000 mg | ORAL_TABLET | Freq: Four times a day (QID) | ORAL | Status: DC | PRN
  Administered 2022-03-22: 650 mg via ORAL
  Filled 2022-03-21: qty 2

## 2022-03-21 MED ORDER — ATORVASTATIN CALCIUM 20 MG PO TABS
20.0000 mg | ORAL_TABLET | Freq: Every day | ORAL | Status: DC
  Administered 2022-03-22 – 2022-03-25 (×4): 20 mg via ORAL
  Filled 2022-03-21 (×4): qty 1

## 2022-03-21 NOTE — ED Triage Notes (Signed)
C/o generalized abd pain with n/v x1 day Hx gastroparesis.

## 2022-03-21 NOTE — Hospital Course (Signed)
Kelli Hudson is a 32 y.o. female with medical history significant for type 1 diabetes complicated by severe gastroparesis and peripheral neuropathy, HTN, HLD, CKD stage II who is admitted with intractable nausea and vomiting due to severe gastroparesis.

## 2022-03-21 NOTE — ED Notes (Signed)
ED TO INPATIENT HANDOFF REPORT  Name/Age/Gender Kelli Hudson 32 y.o. female  Code Status Code Status History     Date Active Date Inactive Code Status Order ID Comments User Context   03/04/2022 1357 03/06/2022 1816 Full Code [id number]  Shelly Coss, MD ED   02/16/2022 1336 02/17/2022 2145 Full Code [id number]  Reubin Milan, MD ED   01/04/2022 0212 01/05/2022 2001 Full Code [id number]  Howerter, Ethelda Chick, DO ED   12/25/2021 2035 12/30/2021 1511 Full Code [id number]  Clance Boll, MD ED   12/07/2021 0838 12/12/2021 1837 Full Code [id number]  Reubin Milan, MD ED   12/02/2021 1311 12/05/2021 1717 Full Code [id number]  Jonnie Finner, DO Inpatient   11/04/2021 2257 11/08/2021 1822 Full Code [id number]  Etta Quill, DO ED   10/09/2021 2219 10/14/2021 1914 Full Code [id number]  Marcelyn Bruins, MD ED   10/04/2021 2223 10/07/2021 2243 Full Code [id number]  Jonnie Finner, DO Inpatient   09/21/2021 0452 09/25/2021 1726 Full Code [id number]  Kristopher Oppenheim, DO Inpatient   09/21/2021 0334 09/21/2021 0452 Full Code [id number]  Kristopher Oppenheim, DO ED   08/24/2021 2352 08/27/2021 2103 Full Code [id number]  Mansy, Arvella Merles, MD ED   08/16/2021 2208 08/19/2021 2046 Full Code [id number]  Kayleen Memos, DO ED   07/29/2021 0909 07/31/2021 1701 Full Code [id number]  Jonnie Finner, DO Inpatient   07/20/2021 2238 07/23/2021 1543 Full Code [id number]  Domenic Polite, MD ED   06/28/2021 1535 07/01/2021 2031 Full Code [id number]  Jonnie Finner, DO ED   06/10/2021 2104 06/14/2021 1623 Full Code [id number]  Orene Desanctis, DO Inpatient   06/04/2021 1715 06/07/2021 1605 Full Code [id number]  Debbe Odea, MD ED   05/28/2021 1111 05/30/2021 1549 Full Code [id number]  Jonnie Finner, DO ED   05/28/2021 0341 05/28/2021 1111 Full Code [id number]  Howerter, Ethelda Chick, DO ED   04/20/2021 2151 04/24/2021 2104 Full Code [id number]  Lenore Cordia, MD ED   02/24/2021 2320 02/28/2021 2215 Full Code [id number]  Jonnie Finner, DO Inpatient   02/24/2021 1737  02/24/2021 2320 Full Code [id number]  Jonnie Finner, DO ED   11/16/2020 0424 11/17/2020 1428 Full Code [id number]  Etta Quill, DO ED   09/08/2020 2328 09/11/2020 1951 Full Code [id number]  Etta Quill, DO ED   08/12/2020 2304 08/15/2020 1517 Full Code [id number]  Etta Quill, DO ED   07/25/2020 1810 07/28/2020 1545 Full Code [id number]  Cherylann Ratel A, DO Inpatient   07/23/2020 2038 07/24/2020 1519 Full Code [id number]  Ileene Musa T, DO ED   06/05/2020 2349 06/09/2020 2026 Full Code [id number]  Or[place], [place], DO ED   05/27/2020 1913 05/28/2020 2248 Full Code [id number]  Kerney Elbe, DO Inpatient   05/09/2020 2149 05/14/2020 2059 Full Code [id number]  Chotiner, Yevonne Aline, MD ED   04/04/2020 1214 04/06/2020 1934 Full Code [id number]  Jonnie Finner, DO ED   02/24/2020 1003 02/27/2020 1622 Full Code [id number]  Jonnie Finner, DO ED   01/25/2020 0113 01/30/2020 1637 Full Code [id number]  Vianne Bulls, MD ED   02/21/2018 1904 02/23/2018 2052 Full Code [id number]  Colbert Ewing, MD ED   02/21/2018 1833 02/21/2018 1904 Full Code [id number]  Colbert Ewing, MD ED   01/02/2016 0842 01/05/2016 1438 Full Code [id number]  Milagros Loll, MD Inpatient   09/02/2015 1729 09/09/2015 2148 Full Code [id number]  Willia Craze, NP ED  Questions for Most Recent Historical Code Status (Order [id number])     Question Answer   By: Consent: discussion documented in EHR            Home/SNF/Other Home  Chief Complaint Intractable nausea and vomiting [R11.2]  Level of Care/Admitting Diagnosis ED Disposition     ED Disposition  Admit   Condition  --   [place]: [place] [[id number]]  Level of Care: Telemetry [5]  Admit to tele based on following criteria: Monitor QTC interval  May place patient in observation at [facility] or [place] if equivalent level of care is available:: No  Covid Evaluation: Asymptomatic - no recent exposure (last 10 days) testing not required   Diagnosis: Intractable nausea and vomiting [440102]  Admitting Physician: Lenore Cordia [[id number]]  Attending Physician: Lenore Cordia [[id number]]          Medical History Past Medical History:  Diagnosis Date   Acute H. pylori gastric ulcer    Coffee ground emesis    Diabetes mellitus ([place])    Diabetic gastroparesis ([place])    DKA (diabetic ketoacidosis) ([place]) 02/24/2021   Gastroparesis    GERD (gastroesophageal reflux disease)    Hypertension    Hyperthyroidism    Intractable nausea and vomiting 04/20/2021   Normocytic anemia 04/20/2020   Prolonged Q-T interval on ECG     Allergies Allergies  Allergen Reactions   Lisinopril Cough    Breakout in rash    IV Location/Drains/Wounds Patient Lines/Drains/Airways Status     Active Line/Drains/Airways     Name Placement date Placement time Site Days   Peripheral IV 03/21/22 20 G 1.88" Right;Upper Antecubital 03/21/22  1605  Antecubital  less than 1            Labs/Imaging Results for orders placed or performed during the hospital encounter of 03/21/22 (from the past 48 hour(s))  CBG monitoring, ED     Status: Abnormal   Collection Time: 03/21/22  3:20 PM  Result Value Ref Range   Glucose-Capillary 246 (H) 70 - 99 mg/dL    Comment: Glucose reference range applies only to samples taken after fasting for at least 8 hours.   Comment 1 Notify RN   CBC with Differential     Status: Abnormal   Collection Time: 03/21/22  4:05 PM  Result Value Ref Range   WBC 12.3 (H) 4.0 - 10.5 K/uL   RBC 4.42 3.87 - 5.11 MIL/uL   Hemoglobin 12.5 12.0 - 15.0 g/dL   HCT 38.1 36.0 - 46.0 %   MCV 86.2 80.0 - 100.0 fL   MCH 28.3 26.0 - 34.0 pg   MCHC 32.8 30.0 - 36.0 g/dL   RDW 13.4 11.5 - 15.5 %   Platelets 556 (H) 150 - 400 K/uL   nRBC 0.0 0.0 - 0.2 %   Neutrophils Relative % 85 %   Neutro Abs 10.5 (H) 1.7 - 7.7 K/uL   Lymphocytes Relative 12 %   Lymphs Abs 1.5 0.7 - 4.0 K/uL   Monocytes Relative 3 %   Monocytes Absolute 0.3  0.1 - 1.0 K/uL   Eosinophils Relative 0 %   Eosinophils Absolute 0.0 0.0 - 0.5 K/uL   Basophils Relative 0 %   Basophils Absolute 0.0 0.0 - 0.1 K/uL   Immature Granulocytes 0 %   Abs Immature Granulocytes 0.02 0.00 - 0.07 K/uL    Comment: Performed at [facility], [address] [place],  [place] [address]  Comprehensive metabolic panel     Status: Abnormal   Collection Time: 03/21/22  4:05 PM  Result Value Ref Range   Sodium 136 135 - 145 mmol/L   Potassium 3.7 3.5 - 5.1 mmol/L   Chloride 100 98 - 111 mmol/L   CO2 24 22 - 32 mmol/L   Glucose, Bld 264 (H) 70 - 99 mg/dL    Comment: Glucose reference range applies only to samples taken after fasting for at least 8 hours.   BUN 31 (H) 6 - 20 mg/dL   Creatinine, Ser 1.35 (H) 0.44 - 1.00 mg/dL   Calcium 10.0 8.9 - 10.3 mg/dL   Total Protein 8.8 (H) 6.5 - 8.1 g/dL   Albumin 3.6 3.5 - 5.0 g/dL   AST 27 15 - 41 U/L   ALT 21 0 - 44 U/L   Alkaline Phosphatase 113 38 - 126 U/L   Total Bilirubin 0.9 0.3 - 1.2 mg/dL   GFR, Estimated 54 (L) >60 mL/min    Comment: (NOTE) Calculated using the CKD-EPI Creatinine Equation (2021)    Anion gap 12 5 - 15    Comment: Performed at [facility], [place] [address], [place], [place] [address]  Lipase, blood     Status: None   Collection Time: 03/21/22  4:05 PM  Result Value Ref Range   Lipase 30 11 - 51 U/L    Comment: Performed at [facility], [place] [address], [place], [place] [address]  hCG, serum, qualitative     Status: None   Collection Time: 03/21/22  4:05 PM  Result Value Ref Range   Preg, Serum NEGATIVE NEGATIVE    Comment: Performed at [facility], [place] [address] [place], [place] [address]  Blood gas, venous     Status: Abnormal   Collection Time: 03/21/22  4:50 PM  Result Value Ref Range   pH, Ven 7.41 7.25 - 7.43   pCO2, Ven 44 44 - 60 mmHg   pO2, Ven <31 (LL) 32 - 45 mmHg    Comment: CRITICAL RESULT CALLED  TO, READ BACK BY AND VERIFIED WITH: R.GROVES, RN AT 1722 ON 02.02.24 BY N.THOMPSON    Bicarbonate 27.9 20.0 - 28.0 mmol/L   Acid-Base Excess 2.7 (H) 0.0 - 2.0 mmol/L   O2 Saturation 37.7 %   Patient temperature 37.0     Comment: Performed at [facility], [place] [address], [place],  [address]   No results found.  Pending Labs Unresulted Labs (From admission, onward)     Start     Ordered   03/21/22 1530  Urinalysis, Routine w reflex microscopic -Urine, Clean Catch  (ED Abdominal Pain)  Once,   URGENT       Question:  Specimen Source  Answer:  Urine, Clean Catch   03/21/22 1530            Vitals/Pain Today's Vitals   03/21/22 1834 03/21/22 1900 03/21/22 1920 03/21/22 2000  BP: 109/69 (!) 196/138  (!) 187/127  Pulse: (!) 125 (!) 120  (!) 124  Resp: 14 13  14   Temp:   98.6 F (37 C)   TempSrc:   Oral   SpO2: 100% 100%  99%  Weight:      PainSc:        Isolation Precautions No active isolations  Medications Medications  labetalol (NORMODYNE) injection 10 mg (has no administration in time range)  HYDROmorphone (DILAUDID) injection 1 mg (has  no administration in time range)  trimethobenzamide (TIGAN) injection 200 mg (has no administration in time range)  metoCLOPramide (REGLAN) injection 5 mg (5 mg Intravenous Given 03/21/22 1612)  sodium chloride 0.9 % bolus 1,000 mL (0 mLs Intravenous Stopped 03/21/22 1915)  HYDROmorphone (DILAUDID) injection 1 mg (1 mg Intravenous Given 03/21/22 1641)  pantoprazole (PROTONIX) injection 40 mg (40 mg Intravenous Given 03/21/22 1755)  LORazepam (ATIVAN) injection 1 mg (1 mg Intravenous Given 03/21/22 1836)  sodium chloride 0.9 % bolus 1,000 mL (1,000 mLs Intravenous New Bag/Given 03/21/22 1917)    Mobility walks

## 2022-03-21 NOTE — ED Provider Notes (Signed)
[place] EMERGENCY DEPARTMENT AT [facility] Provider Note   CSN: [id number] Arrival date & time: 03/21/22  1513     History  Chief Complaint  Patient presents with   Emesis    Kelli Hudson is a 32 y.o. female.  Patient is a 32 year old female who has a history of insulin-dependent diabetes and gastroparesis who presents with nausea vomiting abdominal pain.  She says it similar to her prior gastroparesis.  Her abdominal pain is mostly across the upper abdomen and unchanged from her prior episodes of gastroparesis.  She denies any fevers.  No change in her stools.  No urinary symptoms.  Her emesis is nonbloody and nonbilious.  She states her sugars have been running "high".       Home Medications Prior to Admission medications   Medication Sig Start Date End Date Taking? Authorizing Provider  atorvastatin (LIPITOR) 20 MG tablet Take 1 tablet (20 mg total) by mouth daily. 10/29/21   [name], MD  carvedilol (COREG) 12.5 MG tablet Take 0.5 tablets (6.25 mg total) by mouth 2 (two) times daily with a meal. 03/06/22 04/05/22  Shelly Coss, MD  Continuous Blood Gluc Transmit (DEXCOM G6 TRANSMITTER) MISC Change every 90 days 11/29/21   Shamleffer,  Crazier, MD  dicyclomine (BENTYL) 20 MG tablet Take 1 tablet (20 mg total) by mouth 3 (three) times daily as needed for spasms. 11/08/21   Hosie Poisson, MD  gabapentin (NEURONTIN) 300 MG capsule Take 1 capsule (300 mg total) by mouth 3 (three) times daily. 12/30/21 03/30/22  Barb Merino, MD  hydrOXYzine (VISTARIL) 25 MG capsule Take 1 capsule (25 mg total) by mouth every 8 (eight) hours as needed. Patient taking differently: Take 25 mg by mouth every 8 (eight) hours as needed for anxiety. 07/31/21   Georgette Shell, MD  insulin detemir (LEVEMIR FLEXTOUCH) 100 UNIT/ML FlexPen Inject 16 Units into the skin daily. 03/06/22   Shelly Coss, MD  insulin regular (NOVOLIN R) 100 units/mL injection Inject 0-0.15  mLs (0-15 Units total) into the skin See admin instructions. Inject 0-15 units into the skin three times a day with meals, per sliding scale 03/06/22   Shelly Coss, MD  losartan (COZAAR) 25 MG tablet Take 1 tablet (25 mg total) by mouth daily. 03/06/22   Shelly Coss, MD  metoCLOPramide (REGLAN) 10 MG tablet Take 1 tablet (10 mg total) by mouth every 8 (eight) hours as needed for nausea. 03/06/22   Shelly Coss, MD  ondansetron (ZOFRAN) 4 MG tablet Take 4 mg by mouth every 8 (eight) hours as needed for nausea or vomiting.    [provider]  pantoprazole (PROTONIX) 40 MG tablet Take 1 tablet (40 mg total) by mouth 2 (two) times daily before a meal. 11/08/21   Hosie Poisson, MD  insulin aspart (NOVOLOG) 100 UNIT/ML FlexPen Inject 5 Units into the skin 3 (three) times daily with meals. 02/23/18 07/05/19  Donne Hazel, MD      Allergies    Lisinopril    Review of Systems   Review of Systems  Constitutional:  Negative for chills, diaphoresis, fatigue and fever.  HENT:  Negative for congestion, rhinorrhea and sneezing.   Eyes: Negative.   Respiratory:  Negative for cough, chest tightness and shortness of breath.   Cardiovascular:  Negative for chest pain and leg swelling.  Gastrointestinal:  Positive for abdominal pain, nausea and vomiting. Negative for blood in stool and diarrhea.  Genitourinary:  Negative for difficulty urinating, flank pain, frequency  and hematuria.  Musculoskeletal:  Negative for arthralgias and back pain.  Skin:  Negative for rash.  Neurological:  Negative for dizziness, speech difficulty, weakness, numbness and headaches.    Physical Exam Updated Vital Signs BP (!) 196/138   Pulse (!) 120   Temp 98.6 F (37 C) (Oral)   Resp 13   Wt 80 kg   SpO2 100%   BMI 26.82 kg/m  Physical Exam Constitutional:      Appearance: She is well-developed.  HENT:     Head: Normocephalic and atraumatic.  Eyes:     Pupils: Pupils are equal, round, and reactive to  light.  Cardiovascular:     Rate and Rhythm: Normal rate and regular rhythm.     Heart sounds: Normal heart sounds.  Pulmonary:     Effort: Pulmonary effort is normal. No respiratory distress.     Breath sounds: Normal breath sounds. No wheezing or rales.  Chest:     Chest wall: No tenderness.  Abdominal:     General: Bowel sounds are normal.     Palpations: Abdomen is soft.     Tenderness: There is abdominal tenderness (Tenderness across the upper abdomen). There is no guarding or rebound.  Musculoskeletal:        General: Normal range of motion.     Cervical back: Normal range of motion and neck supple.  Lymphadenopathy:     Cervical: No cervical adenopathy.  Skin:    General: Skin is warm and dry.     Findings: No rash.  Neurological:     Mental Status: She is alert and oriented to person, place, and time.     ED Results / Procedures / Treatments   Labs (all labs ordered are listed, but only abnormal results are displayed) Labs Reviewed  CBC WITH DIFFERENTIAL/PLATELET - Abnormal; Notable for the following components:      Result Value   WBC 12.3 (*)    Platelets 556 (*)    Neutro Abs 10.5 (*)    All other components within normal limits  COMPREHENSIVE METABOLIC PANEL - Abnormal; Notable for the following components:   Glucose, Bld 264 (*)    BUN 31 (*)    Creatinine, Ser 1.35 (*)    Total Protein 8.8 (*)    GFR, Estimated 54 (*)    All other components within normal limits  BLOOD GAS, VENOUS - Abnormal; Notable for the following components:   pO2, Ven <31 (*)    Acid-Base Excess 2.7 (*)    All other components within normal limits  CBG MONITORING, ED - Abnormal; Notable for the following components:   Glucose-Capillary 246 (*)    All other components within normal limits  LIPASE, BLOOD  HCG, SERUM, QUALITATIVE  URINALYSIS, ROUTINE W REFLEX MICROSCOPIC    EKG EKG Interpretation  Date/Time:  Friday March 21 2022 17:54:58 EST Ventricular Rate:  127 PR  Interval:  105 QRS Duration: 102 QT Interval:  427 QTC Calculation: 621 R Axis:   63 Text Interpretation: Sinus tachycardia Prolonged QT interval Confirmed by Malvin Johns [phone]) on 03/21/2022 6:03:08 PM  Radiology No results found.  Procedures Procedures    Medications Ordered in ED Medications  metoCLOPramide (REGLAN) injection 5 mg (5 mg Intravenous Given 03/21/22 1612)  sodium chloride 0.9 % bolus 1,000 mL (0 mLs Intravenous Stopped 03/21/22 1915)  HYDROmorphone (DILAUDID) injection 1 mg (1 mg Intravenous Given 03/21/22 1641)  pantoprazole (PROTONIX) injection 40 mg (40 mg Intravenous Given 03/21/22 1755)  LORazepam (  ATIVAN) injection 1 mg (1 mg Intravenous Given 03/21/22 1836)  sodium chloride 0.9 % bolus 1,000 mL (1,000 mLs Intravenous New Bag/Given 03/21/22 1917)    ED Course/ Medical Decision Making/ A&P                             Medical Decision Making Amount and/or Complexity of Data Reviewed Labs: ordered.  Risk Prescription drug management. Decision regarding hospitalization.   Patient is a 32 year old female who presents with abdominal pain, nausea and vomiting consistent with her prior episodes of gastroparesis.  She says her abdominal pain feels the same as it typically does.  Given this, I did not feel that imaging was indicated.  She is tachycardic.  She was given antiemetics and some pain medication.  She was also given IV fluids.  Her heart rate is improving slightly with the resuscitation efforts.  Her EKG does show some long QT syndrome.  Given this, her antiemetic choice is limited.  Her labs reveal an elevated glucose but there is no suggestions of DKA.  No acidosis.  Her creatinine is slightly more increased than her baseline values based on chart review.  She says she is not really feeling much better after treatment in the ED.  She still tachycardic, will plan admission.  I spoke with Dr. Posey Pronto who will admit the patient for further treatment.  Final Clinical  Impression(s) / ED Diagnoses Final diagnoses:  Intractable vomiting  Pain of upper abdomen  Gastroparesis    Rx / DC Orders ED Discharge Orders     None         Malvin Johns, MD 03/21/22 Kathyrn Drown

## 2022-03-21 NOTE — ED Provider Triage Note (Signed)
Emergency Medicine Provider Triage Evaluation Note  Kelli Hudson , a 32 y.o. female  was evaluated in triage.  Pt complains of abdominal pain and vomiting.  Started this morning.  Vomiting is nonbloody nonbilious.  Denies urinary or bowel changes.  Denies fever.  Patient has gastroparesis.  Has not taken anything for symptoms.  Abdominal pain is generalized and feels sharp.  Review of Systems  Positive: See above Negative: See see above  Physical Exam  BP (!) 163/94 (BP Location: Right Arm)   Pulse 78   Temp 98 F (36.7 C) (Oral)   Resp 18   SpO2 100%  Gen:   Awake, no distress   Resp:  Normal effort  MSK:   Moves extremities without difficulty  Other:  See above  Medical Decision Making  Medically screening exam initiated at 3:28 PM.  Appropriate orders placed.  Kelli Hudson was informed that the remainder of the evaluation will be completed by another provider, this initial triage assessment does not replace that evaluation, and the importance of remaining in the ED until their evaluation is complete.     Harriet Pho, PA-C 03/21/22 1529

## 2022-03-21 NOTE — H&P (Signed)
History and Physical    Kelli Hudson DGU:[id number] DOB: [birth year] DOA: 03/21/2022  PCP: [name], MD  Patient coming from: Home  I have personally briefly reviewed patient's old medical records in [place]  Chief Complaint: Nausea and vomiting  HPI: Kelli Hudson is a 32 y.o. female with medical history significant for type 1 diabetes complicated by severe gastroparesis and peripheral neuropathy, HTN, HLD, CKD stage II who presented to the ED for evaluation of nausea and vomiting.  Patient is frequently admitted for nausea and vomiting related to gastroparesis as well as DKA.  She reports recurrent gastroparesis symptoms with 1 day of generalized abdominal pain and nausea with frequent vomiting.  She is actively vomiting clear emesis.  Has not been able to maintain adequate oral intake.  Denies fevers, chills, chest pain, diarrhea.  ED Course  Labs/Imaging on admission: I have personally reviewed following labs and imaging studies.  Initial vitals showed BP 163/94, pulse 78, RR 18, temp 98.0 F, SpO2 100% on room air.  Labs showed WBC 12.3, hemoglobin 12.5, platelets 556,000, sodium 136, potassium 3.7, bicarb 24, BUN 31, creatinine 1.35, serum glucose 264, LFTs within normal limits, lipase 30, serum hCG negative.  Patient was given 2 L normal saline, IV Reglan 5 mg, IV Ativan 1 mg, IV Dilaudid 1 mg.  Due to persistent nausea and vomiting the hospitalist service was consulted to admit for further evaluation and management.  Review of Systems: All systems reviewed and are negative except as documented in history of present illness above.   Past Medical History:  Diagnosis Date   Acute H. pylori gastric ulcer    Coffee ground emesis    Diabetes mellitus ([place])    Diabetic gastroparesis ([place])    DKA (diabetic ketoacidosis) ([place]) 02/24/2021   Gastroparesis    GERD (gastroesophageal reflux disease)    Hypertension    Hyperthyroidism    Intractable  nausea and vomiting 04/20/2021   Normocytic anemia 04/20/2020   Prolonged Q-T interval on ECG     Past Surgical History:  Procedure Laterality Date   AMPUTATION TOE Left 03/10/2018   Procedure: AMPUTATION FIFTH TOE;  Surgeon: Trula Slade, DPM;  Location: [facility];  Service: Podiatry;  Laterality: Left;   BIOPSY  01/28/2020   Procedure: BIOPSY;  Surgeon: Otis Brace, MD;  Location: [facility];  Service: Gastroenterology;;   BOTOX INJECTION  08/26/2021   Procedure: BOTOX INJECTION;  Surgeon: Doran Stabler, MD;  Location: [facility];  Service: Gastroenterology;;   ESOPHAGOGASTRODUODENOSCOPY N/A 01/28/2020   Procedure: ESOPHAGOGASTRODUODENOSCOPY (EGD);  Surgeon: Otis Brace, MD;  Location: [facility];  Service: Gastroenterology;  Laterality: N/A;   ESOPHAGOGASTRODUODENOSCOPY (EGD) WITH PROPOFOL Left 09/08/2015   Procedure: ESOPHAGOGASTRODUODENOSCOPY (EGD) WITH PROPOFOL;  Surgeon: Arta Silence, MD;  Location: [facility];  Service: Endoscopy;  Laterality: Left;   ESOPHAGOGASTRODUODENOSCOPY (EGD) WITH PROPOFOL N/A 08/26/2021   Procedure: ESOPHAGOGASTRODUODENOSCOPY (EGD) WITH PROPOFOL;  Surgeon: Doran Stabler, MD;  Location: [facility];  Service: Gastroenterology;  Laterality: N/A;   WISDOM TOOTH EXTRACTION      Social History:  reports that she has never smoked. She has never used smokeless tobacco. She reports current alcohol use of about 1.0 standard drink of alcohol per week. She reports that she does not use drugs.  Allergies  Allergen Reactions   Lisinopril Cough    Breakout in rash    Family History  Problem Relation Age of Onset   Lung cancer Mother  Diabetes Mother    Bipolar disorder Father    Liver cancer Maternal Grandfather    Diabetes Maternal Aunt        x 2   Pancreatic cancer Maternal Uncle    Prostate cancer Maternal Uncle    Breast cancer Other        maternal great aunt   Heart disease Other    Ovarian  cancer Other        maternal great aunt   Kidney disease Other        maternal great aunt   Colon cancer Neg Hx    Stomach cancer Neg Hx      Prior to Admission medications   Medication Sig Start Date End Date Taking? Authorizing Provider  atorvastatin (LIPITOR) 20 MG tablet Take 1 tablet (20 mg total) by mouth daily. 10/29/21   [name], MD  carvedilol (COREG) 12.5 MG tablet Take 0.5 tablets (6.25 mg total) by mouth 2 (two) times daily with a meal. 03/06/22 04/05/22  Shelly Coss, MD  Continuous Blood Gluc Transmit (DEXCOM G6 TRANSMITTER) MISC Change every 90 days 11/29/21   Shamleffer, Melanie Crazier, MD  dicyclomine (BENTYL) 20 MG tablet Take 1 tablet (20 mg total) by mouth 3 (three) times daily as needed for spasms. 11/08/21   Hosie Poisson, MD  gabapentin (NEURONTIN) 300 MG capsule Take 1 capsule (300 mg total) by mouth 3 (three) times daily. 12/30/21 03/30/22  Barb Merino, MD  hydrOXYzine (VISTARIL) 25 MG capsule Take 1 capsule (25 mg total) by mouth every 8 (eight) hours as needed. Patient taking differently: Take 25 mg by mouth every 8 (eight) hours as needed for anxiety. 07/31/21   Georgette Shell, MD  insulin detemir (LEVEMIR FLEXTOUCH) 100 UNIT/ML FlexPen Inject 16 Units into the skin daily. 03/06/22   Shelly Coss, MD  insulin regular (NOVOLIN R) 100 units/mL injection Inject 0-0.15 mLs (0-15 Units total) into the skin See admin instructions. Inject 0-15 units into the skin three times a day with meals, per sliding scale 03/06/22   Shelly Coss, MD  losartan (COZAAR) 25 MG tablet Take 1 tablet (25 mg total) by mouth daily. 03/06/22   Shelly Coss, MD  metoCLOPramide (REGLAN) 10 MG tablet Take 1 tablet (10 mg total) by mouth every 8 (eight) hours as needed for nausea. 03/06/22   Shelly Coss, MD  ondansetron (ZOFRAN) 4 MG tablet Take 4 mg by mouth every 8 (eight) hours as needed for nausea or vomiting.    [provider]  pantoprazole (PROTONIX) 40 MG  tablet Take 1 tablet (40 mg total) by mouth 2 (two) times daily before a meal. 11/08/21   Hosie Poisson, MD  insulin aspart (NOVOLOG) 100 UNIT/ML FlexPen Inject 5 Units into the skin 3 (three) times daily with meals. 02/23/18 07/05/19  Donne Hazel, MD    Physical Exam: Vitals:   03/21/22 1930 03/21/22 2000 03/21/22 2030 03/21/22 2052  BP: (!) 183/117 (!) 187/127 (!) 189/119 (!) 185/110  Pulse: (!) 123 (!) 124 (!) 114 (!) 119  Resp: (!) 6 14 14    Temp:    98.3 F (36.8 C)  TempSrc:    Oral  SpO2: 99% 99% 100% 100%  Weight:       Constitutional: Resting in bed, appears fatigued, active clear emesis Eyes: EOMI, lids and conjunctivae normal ENMT: Mucous membranes are dry. Posterior pharynx clear of any exudate or lesions.Normal dentition.  Neck: normal, supple, no masses. Respiratory: clear to auscultation bilaterally, no wheezing, no  crackles. Normal respiratory effort. No accessory muscle use.  Cardiovascular: Regular rate and rhythm, no murmurs / rubs / gallops. No extremity edema. 2+ pedal pulses. Abdomen: Generalized tenderness Musculoskeletal: no clubbing / cyanosis. No joint deformity upper and lower extremities. Good ROM, no contractures. Normal muscle tone.  Skin: no rashes, lesions, ulcers. No induration Neurologic: Sensation intact. Strength equal bilaterally. Psychiatric: Alert and oriented x 3.   EKG: Personally reviewed. Sinus tachycardia, rate 127, QTc 621.  QT is more prolonged when compared to prior.  Assessment/Plan Principal Problem:   Intractable nausea and vomiting Active Problems:   Diabetic gastroparesis associated with type 1 diabetes mellitus ([place])   Hypertension associated with diabetes ([place])   Uncontrolled type 1 diabetes mellitus with hyperglycemia, with long-term current use of insulin ([facility])   HLD (hyperlipidemia)   Prolonged Q-T interval on ECG   Sylina Henion is a 32 y.o. female with medical history significant for type 1 diabetes complicated  by severe gastroparesis and peripheral neuropathy, HTN, HLD, CKD stage II who is admitted with intractable nausea and vomiting due to severe gastroparesis.  Assessment and Plan: Intractable nausea and vomiting due to severe gastroparesis: Recurrent episode of intractable nausea and vomiting due to known gastroparesis.  Not able to maintain adequate oral intake. -Keep n.p.o. for now, advance diet as tolerated -IM Tigan prn nausea/vomiting given prolonged QT -IV Benadryl as needed -Continue IV fluid hydration overnight  Type 1 diabetes with hyperglycemia: Hyperglycemic without evidence of DKA or HHS.  Continue Semglee 16 units daily and moderate SSI.  Prolonged QTc: Noted on EKG.  Keep on telemetry.  Limit potential QT prolonging medications.  Hypertension: Resume Coreg and losartan when tolerating orals.  Hyperlipidemia: Resume statin when tolerating oral.   DVT prophylaxis: enoxaparin (LOVENOX) injection 40 mg Start: 03/21/22 2200 Code Status: Full code Family Communication: Discussed with patient, she has discussed with family Disposition Plan: From home and likely discharge to home pending clinical progress Consults called: None Severity of Illness: The appropriate patient status for this patient is OBSERVATION. Observation status is judged to be reasonable and necessary in order to provide the required intensity of service to ensure the patient's safety. The patient's presenting symptoms, physical exam findings, and initial radiographic and laboratory data in the context of their medical condition is felt to place them at decreased risk for further clinical deterioration. Furthermore, it is anticipated that the patient will be medically stable for discharge from the hospital within 2 midnights of admission.   Zada Finders MD [facility]  If 7PM-7AM, please contact night-coverage [email or web address]  03/21/2022, 8:58 PM

## 2022-03-22 ENCOUNTER — Observation Stay (HOSPITAL_COMMUNITY): Payer: Medicaid Other

## 2022-03-22 DIAGNOSIS — Z79899 Other long term (current) drug therapy: Secondary | ICD-10-CM | POA: Diagnosis not present

## 2022-03-22 DIAGNOSIS — D72829 Elevated white blood cell count, unspecified: Secondary | ICD-10-CM | POA: Diagnosis present

## 2022-03-22 DIAGNOSIS — R9431 Abnormal electrocardiogram [ECG] [EKG]: Secondary | ICD-10-CM | POA: Diagnosis not present

## 2022-03-22 DIAGNOSIS — Z801 Family history of malignant neoplasm of trachea, bronchus and lung: Secondary | ICD-10-CM | POA: Diagnosis not present

## 2022-03-22 DIAGNOSIS — K3184 Gastroparesis: Secondary | ICD-10-CM

## 2022-03-22 DIAGNOSIS — Z803 Family history of malignant neoplasm of breast: Secondary | ICD-10-CM | POA: Diagnosis not present

## 2022-03-22 DIAGNOSIS — E1022 Type 1 diabetes mellitus with diabetic chronic kidney disease: Secondary | ICD-10-CM | POA: Diagnosis present

## 2022-03-22 DIAGNOSIS — E1065 Type 1 diabetes mellitus with hyperglycemia: Secondary | ICD-10-CM | POA: Diagnosis not present

## 2022-03-22 DIAGNOSIS — Z818 Family history of other mental and behavioral disorders: Secondary | ICD-10-CM | POA: Diagnosis not present

## 2022-03-22 DIAGNOSIS — N179 Acute kidney failure, unspecified: Secondary | ICD-10-CM | POA: Diagnosis present

## 2022-03-22 DIAGNOSIS — E1043 Type 1 diabetes mellitus with diabetic autonomic (poly)neuropathy: Secondary | ICD-10-CM | POA: Diagnosis present

## 2022-03-22 DIAGNOSIS — I152 Hypertension secondary to endocrine disorders: Secondary | ICD-10-CM

## 2022-03-22 DIAGNOSIS — R000 Tachycardia, unspecified: Secondary | ICD-10-CM

## 2022-03-22 DIAGNOSIS — Z888 Allergy status to other drugs, medicaments and biological substances status: Secondary | ICD-10-CM | POA: Diagnosis not present

## 2022-03-22 DIAGNOSIS — N182 Chronic kidney disease, stage 2 (mild): Secondary | ICD-10-CM | POA: Diagnosis present

## 2022-03-22 DIAGNOSIS — E1159 Type 2 diabetes mellitus with other circulatory complications: Secondary | ICD-10-CM

## 2022-03-22 DIAGNOSIS — Z794 Long term (current) use of insulin: Secondary | ICD-10-CM | POA: Diagnosis not present

## 2022-03-22 DIAGNOSIS — Z833 Family history of diabetes mellitus: Secondary | ICD-10-CM | POA: Diagnosis not present

## 2022-03-22 DIAGNOSIS — D509 Iron deficiency anemia, unspecified: Secondary | ICD-10-CM | POA: Diagnosis present

## 2022-03-22 DIAGNOSIS — E785 Hyperlipidemia, unspecified: Secondary | ICD-10-CM | POA: Diagnosis present

## 2022-03-22 DIAGNOSIS — I129 Hypertensive chronic kidney disease with stage 1 through stage 4 chronic kidney disease, or unspecified chronic kidney disease: Secondary | ICD-10-CM | POA: Diagnosis present

## 2022-03-22 DIAGNOSIS — E10649 Type 1 diabetes mellitus with hypoglycemia without coma: Secondary | ICD-10-CM | POA: Diagnosis present

## 2022-03-22 DIAGNOSIS — Z89422 Acquired absence of other left toe(s): Secondary | ICD-10-CM | POA: Diagnosis not present

## 2022-03-22 DIAGNOSIS — Z8041 Family history of malignant neoplasm of ovary: Secondary | ICD-10-CM | POA: Diagnosis not present

## 2022-03-22 DIAGNOSIS — E1042 Type 1 diabetes mellitus with diabetic polyneuropathy: Secondary | ICD-10-CM | POA: Diagnosis present

## 2022-03-22 DIAGNOSIS — E1069 Type 1 diabetes mellitus with other specified complication: Secondary | ICD-10-CM | POA: Diagnosis present

## 2022-03-22 DIAGNOSIS — R112 Nausea with vomiting, unspecified: Secondary | ICD-10-CM | POA: Diagnosis not present

## 2022-03-22 DIAGNOSIS — Z800 Family history of malignant neoplasm of digestive organs: Secondary | ICD-10-CM | POA: Diagnosis not present

## 2022-03-22 LAB — CBC
HCT: 34.2 % — ABNORMAL LOW (ref 36.0–46.0)
Hemoglobin: 10.9 g/dL — ABNORMAL LOW (ref 12.0–15.0)
MCH: 28.4 pg (ref 26.0–34.0)
MCHC: 31.9 g/dL (ref 30.0–36.0)
MCV: 89.1 fL (ref 80.0–100.0)
Platelets: 479 10*3/uL — ABNORMAL HIGH (ref 150–400)
RBC: 3.84 MIL/uL — ABNORMAL LOW (ref 3.87–5.11)
RDW: 13.9 % (ref 11.5–15.5)
WBC: 14.1 10*3/uL — ABNORMAL HIGH (ref 4.0–10.5)
nRBC: 0 % (ref 0.0–0.2)

## 2022-03-22 LAB — BASIC METABOLIC PANEL
Anion gap: 12 (ref 5–15)
BUN: 30 mg/dL — ABNORMAL HIGH (ref 6–20)
CO2: 24 mmol/L (ref 22–32)
Calcium: 9.6 mg/dL (ref 8.9–10.3)
Chloride: 104 mmol/L (ref 98–111)
Creatinine, Ser: 1.74 mg/dL — ABNORMAL HIGH (ref 0.44–1.00)
GFR, Estimated: 40 mL/min — ABNORMAL LOW (ref >60)
Glucose, Bld: 211 mg/dL — ABNORMAL HIGH (ref 70–99)
Potassium: 3.8 mmol/L (ref 3.5–5.1)
Sodium: 140 mmol/L (ref 135–145)

## 2022-03-22 LAB — RAPID URINE DRUG SCREEN, HOSP PERFORMED
Amphetamines: NOT DETECTED
Barbiturates: NOT DETECTED
Benzodiazepines: NOT DETECTED
Cocaine: NOT DETECTED
Opiates: POSITIVE — AB
Tetrahydrocannabinol: NOT DETECTED

## 2022-03-22 LAB — URINALYSIS, ROUTINE W REFLEX MICROSCOPIC
Bilirubin Urine: NEGATIVE
Glucose, UA: >=500 mg/dL — AB
Ketones, ur: 5 mg/dL — AB
Nitrite: NEGATIVE
Protein, ur: >=300 mg/dL — AB
Specific Gravity, Urine: 1.018 (ref 1.005–1.030)
pH: 5 (ref 5.0–8.0)

## 2022-03-22 LAB — GLUCOSE, CAPILLARY
Glucose-Capillary: 231 mg/dL — ABNORMAL HIGH (ref 70–99)
Glucose-Capillary: 211 mg/dL — ABNORMAL HIGH (ref 70–99)
Glucose-Capillary: 200 mg/dL — ABNORMAL HIGH (ref 70–99)
Glucose-Capillary: 309 mg/dL — ABNORMAL HIGH (ref 70–99)
Glucose-Capillary: 213 mg/dL — ABNORMAL HIGH (ref 70–99)
Glucose-Capillary: 171 mg/dL — ABNORMAL HIGH (ref 70–99)

## 2022-03-22 LAB — HIV ANTIBODY (ROUTINE TESTING W REFLEX): HIV Screen 4th Generation wRfx: NONREACTIVE

## 2022-03-22 MED ORDER — HYDROMORPHONE HCL 1 MG/ML IJ SOLN: 0.5000 mg | INTRAMUSCULAR | Status: DC | PRN

## 2022-03-22 MED ORDER — LACTATED RINGERS IV SOLN: INTRAVENOUS | Status: DC

## 2022-03-22 MED ORDER — METOPROLOL TARTRATE 50 MG PO TABS
50.0000 mg | ORAL_TABLET | Freq: Two times a day (BID) | ORAL | Status: DC
  Filled 2022-03-22: qty 1

## 2022-03-22 MED ORDER — INSULIN GLARGINE-YFGN 100 UNIT/ML ~~LOC~~ SOLN
16.0000 [IU] | Freq: Two times a day (BID) | SUBCUTANEOUS | Status: DC
  Administered 2022-03-22 (×2): 16 [IU] via SUBCUTANEOUS
  Filled 2022-03-22 (×3): qty 0.16

## 2022-03-22 MED ORDER — IOHEXOL 300 MG/ML  SOLN
100.0000 mL | Freq: Once | INTRAMUSCULAR | Status: AC | PRN
  Administered 2022-03-22: 100 mL via INTRAVENOUS

## 2022-03-22 MED ORDER — HYDROMORPHONE HCL 1 MG/ML IJ SOLN
1.0000 mg | INTRAMUSCULAR | Status: DC | PRN
  Administered 2022-03-22 – 2022-03-23 (×5): 1 mg via INTRAVENOUS
  Filled 2022-03-22 (×5): qty 1

## 2022-03-22 MED ORDER — HYDROMORPHONE HCL 1 MG/ML IJ SOLN
0.5000 mg | INTRAMUSCULAR | Status: DC | PRN
  Administered 2022-03-22: 0.5 mg via INTRAVENOUS
  Filled 2022-03-22: qty 0.5

## 2022-03-22 MED ORDER — PANTOPRAZOLE SODIUM 40 MG IV SOLR
40.0000 mg | Freq: Two times a day (BID) | INTRAVENOUS | Status: DC
  Administered 2022-03-22 – 2022-03-24 (×5): 40 mg via INTRAVENOUS
  Filled 2022-03-22 (×6): qty 10

## 2022-03-22 MED ORDER — METOPROLOL TARTRATE 5 MG/5ML IV SOLN
5.0000 mg | Freq: Four times a day (QID) | INTRAVENOUS | Status: DC
  Administered 2022-03-22 – 2022-03-24 (×7): 5 mg via INTRAVENOUS
  Filled 2022-03-22 (×7): qty 5

## 2022-03-22 NOTE — Progress Notes (Signed)
   03/22/22 0847  Assess: MEWS Score  Temp 98.5 F (36.9 C)  BP (!) 160/105  MAP (mmHg) 122  Pulse Rate (!) 113  Resp 16  Level of Consciousness Alert  SpO2 100 %  O2 Device Room Air  Assess: MEWS Score  MEWS Temp 0  MEWS Systolic 0  MEWS Pulse 2  MEWS RR 0  MEWS LOC 0  MEWS Score 2  MEWS Score Color Yellow  Assess: if the MEWS score is Yellow or Red  Were vital signs taken at a resting state? Yes  Focused Assessment Change from prior assessment (see assessment flowsheet)  Does the patient meet 2 or more of the SIRS criteria? Yes  Does the patient have a confirmed or suspected source of infection? No  MEWS guidelines implemented *See Row Information* Yes  Treat  MEWS Interventions Escalated (See documentation below);Administered scheduled meds/treatments (Notified Dr. Cyndia Skeeters, MD at 8:52 AM and Clotilde Dieter, Charge RN. Scheduled IV Metoprolol given at 9:36 AM.)  Take Vital Signs  Increase Vital Sign Frequency  Yellow: Q 2hr X 2 then Q 4hr X 2, if remains yellow, continue Q 4hrs  Escalate  MEWS: Escalate Yellow: discuss with charge nurse/RN and consider discussing with provider and RRT  Notify: Charge Nurse/RN  Name of Charge Nurse/RN Notified Clotilde Dieter, Charge RN  Date Charge Nurse/RN Notified 03/22/22  Time Charge Nurse/RN Notified [phone]  Provider Notification  Provider Name/Title Dr. Wendee Beavers, MD  Date Provider Notified 03/22/22  Time Provider Notified [phone]  Method of Notification Page  Notification Reason Change in status (Elevated HR)  Provider response See new orders (IV Metoprolol Q6 hour 5 mg PRN. IV Labatalol discontinued.)  Date of Provider Response 03/22/22  Time of Provider Response [phone]  Notify: Rapid Response  Name of Rapid Response RN Notified  (Pt alert and oriented and not in any respiratory distress at this time. Rapid response not needed at this time.)  Date Rapid Response Notified  (Pt alert and oriented and not in any respiratory distress at  this time. Rapid response not needed at this time.)  Time Rapid Response Notified  (Pt alert and oriented and not in any respiratory distress at this time. Rapid response not needed at this time.)  Assess: SIRS CRITERIA  SIRS Temperature  0  SIRS Pulse 1  SIRS Respirations  0  SIRS WBC 1  SIRS Score Sum  2

## 2022-03-22 NOTE — Plan of Care (Signed)
  Problem: Education: Goal: Knowledge of General Education information will improve Description Including pain rating scale, medication(s)/side effects and non-pharmacologic comfort measures Outcome: Progressing   

## 2022-03-22 NOTE — Progress Notes (Signed)
Patient getting transported down to CT. 

## 2022-03-22 NOTE — Progress Notes (Signed)
Patient c/o 10/10 sharp abdominal pain. Patient also just had nausea/vomiting episode. Dr. Cyndia Skeeters, MD has been paged. Waiting on pharmacy to tube nausea med and to verify IV pain med.

## 2022-03-22 NOTE — Progress Notes (Addendum)
PROGRESS NOTE  Kelli Hudson HYI:[id number] DOB: Jul 08, 1990   PCP: [name], MD  Patient is from: Home.  DOA: 03/21/2022 LOS: 0  Chief complaints Chief Complaint  Patient presents with   Emesis     Brief Narrative / Interim history: 32 year old F with PMH of DM-1 with severe gastroparesis and peripheral neuropathy, HTN and HLD admitted with intractable nausea, vomiting and abdominal pain.  Workup in ED with mild leukocytosis.  Urine pregnancy test negative.  Started on IV fluid, IM Tigan (due to prolonged QT) and IV analgesics.  The next day, patient continued to endorse significant nausea, vomiting and epigastric abdominal pain.  CT abdomen and pelvis ordered.   Subjective: Seen and examined earlier this morning.  Continues to endorse significant nausea, vomiting and abdominal pain.  She reports emesis is getting pinkish.  She rates her pain 10/10.  Describes the pain as sharp and stabbing mainly in epigastric area.  She is hypertensive and tachycardic likely due to pain.  Objective: Vitals:   03/22/22 0011 03/22/22 0303 03/22/22 0847 03/22/22 1017  BP: (!) 164/96 106/60 (!) 160/105 (!) 167/107  Pulse: (!) 116 (!) 104 (!) 113 (!) 108  Resp: 18 18 16 13   Temp: 98.6 F (37 C) 98.3 F (36.8 C) 98.5 F (36.9 C) 97.7 F (36.5 C)  TempSrc: Oral Oral Oral Oral  SpO2: 100% 98% 100% 100%  Weight:        Examination:  GENERAL: Appears to be in significant distress from pain. HEENT: MMM.  Vision and hearing grossly intact.  NECK: Supple.  No apparent JVD.  RESP:  No IWOB.  Fair aeration bilaterally. CVS:  RRR. Heart sounds normal.  ABD/GI/GU: BS+. Abd soft but tender to palpation. MSK/EXT:  Moves extremities. No apparent deformity. No edema.  SKIN: no apparent skin lesion or wound NEURO: Awake, alert and oriented appropriately.  No apparent focal neuro deficit. PSYCH: Calm. Normal affect.  Procedures:  None  Microbiology summarized: None  Assessment and  plan: Principal Problem:   Intractable nausea and vomiting Active Problems:   Diabetic gastroparesis associated with type 1 diabetes mellitus ([place])   Hypertension associated with diabetes ([place])   Uncontrolled type 1 diabetes mellitus with hyperglycemia, with long-term current use of insulin ([facility])   HLD (hyperlipidemia)   Prolonged Q-T interval on ECG  Intractable nausea, vomiting and abdominal pain likely due to severe gastroparesis: Known history of severe gastroparesis due to uncontrolled type 1 diabetes.  Unfortunately, not able to use Reglan due to markedly prolonged QT.  Lipase and LFTs within normal.  Urine pregnancy test negative.  Low suspicion for infectious process.  Mild leukocytosis likely demargination from stress.  Previously hospitalized for the same from 1/16-1/18, and discharged on scheduled Reglan -Continue IV fluid. -Increase IV Dilaudid to 1 mg every 3 hours for severe pain although this is counterproductive with gastroparesis -Continue IM Tigan due to prolonged QT.  Will check EKG once tachycardia better.  -Change p.o. meds to IV including Protonix. -Continue sips with meds for now -CT abdomen and pelvis -Check UDS -Discontinue subcu Lovenox.  SCD for VTE prophylaxis  AKI: Likely due to the above. Recent Labs    02/16/22 0946 02/17/22 1114 03/04/22 0714 03/04/22 1900 03/04/22 2310 03/05/22 0158 03/05/22 0646 03/06/22 0737 03/21/22 1605 03/22/22 0618  BUN 16 11 22* 23* 21* 21* 20 19 31* 30*  CREATININE 1.19* 1.03* 1.28* 1.28* 1.13* 1.19* 1.22* 1.26* 1.35* 1.74*  -Continue IV fluid -Continue monitoring -Strict intake and output  Uncontrolled type 1 diabetes with hyperglycemia, severe gastroparesis and neuropathy: A1c 10.3% in 09/2021. Recent Labs  Lab 03/21/22 1520 03/21/22 2246 03/22/22 0058 03/22/22 0320 03/22/22 0729  GLUCAP 246* 282* 231* 211* 200*  -Increase Semglee from 16 units at bedtime to to 16 units twice daily -Continue SSI moderate  every 4 hours. -Recheck hemoglobin A1c  Essential hypertension/sinus tachycardia: BP in HR elevated likely due to pain.  Not able to tolerate p.o. -IV metoprolol 5 mg every 6 hours -Pain control as above   Prolonged QTc: QTc 621 although exaggerated by tachycardia.  HR was 127 -IV metoprolol as above for tachycardia -Repeat EKG once tachycardia resolves -Avoid QT prolonging drugs for now   Hyperlipidemia: -Hold statin for now.  Body mass index is 26.82 kg/m.          DVT prophylaxis:  Place and maintain sequential compression device Start: 03/22/22 0900  Code Status: Full code Family Communication: None at bedside Level of care: Telemetry Status is: Observation The patient will require care spanning > 2 midnights and should be moved to inpatient because: Intractable nausea, vomiting, abdominal pain   Final disposition: Likely home once medically stable Consultants:  None  55 minutes with more than 50% spent in reviewing records, counseling patient/family and coordinating care.   Sch Meds:  Scheduled Meds:  atorvastatin  20 mg Oral Daily   gabapentin  300 mg Oral TID   insulin aspart  0-15 Units Subcutaneous Q4H   insulin glargine-yfgn  16 Units Subcutaneous QHS   metoprolol tartrate  5 mg Intravenous Q6H   pantoprazole (PROTONIX) IV  40 mg Intravenous Q12H   sodium chloride flush  3 mL Intravenous Q12H   Continuous Infusions:  lactated ringers     PRN Meds:.acetaminophen **OR** acetaminophen, diphenhydrAMINE, HYDROmorphone (DILAUDID) injection, iohexol, senna-docusate, trimethobenzamide  Antimicrobials: Anti-infectives (From admission, onward)    None        I have personally reviewed the following labs and images: CBC: Recent Labs  Lab 03/21/22 1605 03/22/22 0618  WBC 12.3* 14.1*  NEUTROABS 10.5*  --   HGB 12.5 10.9*  HCT 38.1 34.2*  MCV 86.2 89.1  PLT 556* 479*   BMP &GFR Recent Labs  Lab 03/21/22 1605 03/22/22 0618  NA 136 140  K  3.7 3.8  CL 100 104  CO2 24 24  GLUCOSE 264* 211*  BUN 31* 30*  CREATININE 1.35* 1.74*  CALCIUM 10.0 9.6   Estimated Creatinine Clearance: 52.5 mL/min (A) (by C-G formula based on SCr of 1.74 mg/dL (H)). Liver & Pancreas: Recent Labs  Lab 03/21/22 1605  AST 27  ALT 21  ALKPHOS 113  BILITOT 0.9  PROT 8.8*  ALBUMIN 3.6   Recent Labs  Lab 03/21/22 1605  LIPASE 30   No results for input(s): "AMMONIA" in the last 168 hours. Diabetic: No results for input(s): "HGBA1C" in the last 72 hours. Recent Labs  Lab 03/21/22 1520 03/21/22 2246 03/22/22 0058 03/22/22 0320 03/22/22 0729  GLUCAP 246* 282* 231* 211* 200*   Cardiac Enzymes: No results for input(s): "CKTOTAL", "CKMB", "CKMBINDEX", "TROPONINI" in the last 168 hours. No results for input(s): "PROBNP" in the last 8760 hours. Coagulation Profile: No results for input(s): "INR", "PROTIME" in the last 168 hours. Thyroid Function Tests: No results for input(s): "TSH", "T4TOTAL", "FREET4", "T3FREE", "THYROIDAB" in the last 72 hours. Lipid Profile: No results for input(s): "CHOL", "HDL", "LDLCALC", "TRIG", "CHOLHDL", "LDLDIRECT" in the last 72 hours. Anemia Panel: No results for input(s): "VITAMINB12", "FOLATE", "FERRITIN", "  TIBC", "IRON", "RETICCTPCT" in the last 72 hours. Urine analysis:    Component Value Date/Time   COLORURINE YELLOW 03/21/2022 0944   APPEARANCEUR CLOUDY (A) 03/21/2022 0944   LABSPEC 1.018 03/21/2022 0944   PHURINE 5.0 03/21/2022 0944   GLUCOSEU >=500 (A) 03/21/2022 0944   GLUCOSEU NEGATIVE 05/22/2020 1417   HGBUR SMALL (A) 03/21/2022 0944   BILIRUBINUR NEGATIVE 03/21/2022 0944   KETONESUR 5 (A) 03/21/2022 0944   PROTEINUR >=300 (A) 03/21/2022 0944   UROBILINOGEN 0.2 05/22/2020 1417   NITRITE NEGATIVE 03/21/2022 0944   LEUKOCYTESUR TRACE (A) 03/21/2022 0944   Sepsis Labs: Invalid input(s): "PROCALCITONIN", "LACTICIDVEN"  Microbiology: No results found for this or any previous visit (from the  past 240 hour(s)).  Radiology Studies: No results found.    Dacotah Cabello T. [place]  If 7PM-7AM, please contact night-coverage [email or web address] 03/22/2022, 11:20 AM

## 2022-03-23 DIAGNOSIS — R112 Nausea with vomiting, unspecified: Secondary | ICD-10-CM | POA: Diagnosis not present

## 2022-03-23 DIAGNOSIS — E1065 Type 1 diabetes mellitus with hyperglycemia: Secondary | ICD-10-CM | POA: Diagnosis not present

## 2022-03-23 DIAGNOSIS — R9431 Abnormal electrocardiogram [ECG] [EKG]: Secondary | ICD-10-CM | POA: Diagnosis not present

## 2022-03-23 DIAGNOSIS — E1159 Type 2 diabetes mellitus with other circulatory complications: Secondary | ICD-10-CM | POA: Diagnosis not present

## 2022-03-23 DIAGNOSIS — D649 Anemia, unspecified: Secondary | ICD-10-CM

## 2022-03-23 LAB — CBC
HCT: 31.5 % — ABNORMAL LOW (ref 36.0–46.0)
Hemoglobin: 10 g/dL — ABNORMAL LOW (ref 12.0–15.0)
MCH: 28.9 pg (ref 26.0–34.0)
MCHC: 31.7 g/dL (ref 30.0–36.0)
MCV: 91 fL (ref 80.0–100.0)
Platelets: 395 10*3/uL (ref 150–400)
RBC: 3.46 MIL/uL — ABNORMAL LOW (ref 3.87–5.11)
RDW: 14.6 % (ref 11.5–15.5)
WBC: 9.2 10*3/uL (ref 4.0–10.5)
nRBC: 0 % (ref 0.0–0.2)

## 2022-03-23 LAB — HEMOGLOBIN A1C
Hgb A1c MFr Bld: 11.2 % — ABNORMAL HIGH (ref 4.8–5.6)
Mean Plasma Glucose: 274.74 mg/dL

## 2022-03-23 LAB — COMPREHENSIVE METABOLIC PANEL
ALT: 15 U/L (ref 0–44)
AST: 19 U/L (ref 15–41)
Albumin: 2.5 g/dL — ABNORMAL LOW (ref 3.5–5.0)
Alkaline Phosphatase: 70 U/L (ref 38–126)
Anion gap: 13 (ref 5–15)
BUN: 25 mg/dL — ABNORMAL HIGH (ref 6–20)
CO2: 22 mmol/L (ref 22–32)
Calcium: 9 mg/dL (ref 8.9–10.3)
Chloride: 103 mmol/L (ref 98–111)
Creatinine, Ser: 1.18 mg/dL — ABNORMAL HIGH (ref 0.44–1.00)
GFR, Estimated: >60 mL/min (ref >60)
Glucose, Bld: 186 mg/dL — ABNORMAL HIGH (ref 70–99)
Potassium: 3.6 mmol/L (ref 3.5–5.1)
Sodium: 138 mmol/L (ref 135–145)
Total Bilirubin: 0.9 mg/dL (ref 0.3–1.2)
Total Protein: 6 g/dL — ABNORMAL LOW (ref 6.5–8.1)

## 2022-03-23 LAB — GLUCOSE, CAPILLARY
Glucose-Capillary: 104 mg/dL — ABNORMAL HIGH (ref 70–99)
Glucose-Capillary: 116 mg/dL — ABNORMAL HIGH (ref 70–99)
Glucose-Capillary: 131 mg/dL — ABNORMAL HIGH (ref 70–99)
Glucose-Capillary: 200 mg/dL — ABNORMAL HIGH (ref 70–99)
Glucose-Capillary: 223 mg/dL — ABNORMAL HIGH (ref 70–99)
Glucose-Capillary: 65 mg/dL — ABNORMAL LOW (ref 70–99)
Glucose-Capillary: 93 mg/dL (ref 70–99)

## 2022-03-23 LAB — PHOSPHORUS: Phosphorus: 4.5 mg/dL (ref 2.5–4.6)

## 2022-03-23 LAB — MAGNESIUM: Magnesium: 1.8 mg/dL (ref 1.7–2.4)

## 2022-03-23 MED ORDER — METOCLOPRAMIDE HCL 5 MG/ML IJ SOLN
5.0000 mg | Freq: Three times a day (TID) | INTRAMUSCULAR | Status: DC
  Administered 2022-03-23 – 2022-03-25 (×9): 5 mg via INTRAVENOUS
  Filled 2022-03-23 (×8): qty 2

## 2022-03-23 MED ORDER — DEXTROSE 50 % IV SOLN
12.5000 g | INTRAVENOUS | Status: AC
  Administered 2022-03-23: 12.5 g via INTRAVENOUS
  Filled 2022-03-23: qty 50

## 2022-03-23 MED ORDER — OXYCODONE HCL 5 MG PO TABS
5.0000 mg | ORAL_TABLET | Freq: Four times a day (QID) | ORAL | Status: DC | PRN
  Administered 2022-03-24: 5 mg via ORAL
  Filled 2022-03-23: qty 1

## 2022-03-23 MED ORDER — METOCLOPRAMIDE HCL 5 MG/ML IJ SOLN
5.0000 mg | Freq: Four times a day (QID) | INTRAMUSCULAR | Status: DC
  Filled 2022-03-23: qty 2

## 2022-03-23 MED ORDER — MAGNESIUM SULFATE 2 GM/50ML IV SOLN
2.0000 g | Freq: Once | INTRAVENOUS | Status: AC
  Administered 2022-03-23: 2 g via INTRAVENOUS
  Filled 2022-03-23: qty 50

## 2022-03-23 MED ORDER — POTASSIUM CHLORIDE 2 MEQ/ML IV SOLN
INTRAVENOUS | Status: DC
  Filled 2022-03-23 (×5): qty 1000

## 2022-03-23 MED ORDER — HYDROMORPHONE HCL 1 MG/ML IJ SOLN
0.5000 mg | INTRAMUSCULAR | Status: DC | PRN
  Administered 2022-03-23 – 2022-03-24 (×5): 0.5 mg via INTRAVENOUS
  Filled 2022-03-23 (×5): qty 0.5

## 2022-03-23 MED ORDER — INSULIN ASPART 100 UNIT/ML IJ SOLN: 0.0000 [IU] | Freq: Every day | INTRAMUSCULAR | Status: DC

## 2022-03-23 MED ORDER — INSULIN GLARGINE-YFGN 100 UNIT/ML ~~LOC~~ SOLN: 16.0000 [IU] | Freq: Every day | SUBCUTANEOUS | Status: DC

## 2022-03-23 MED ORDER — INSULIN ASPART 100 UNIT/ML IJ SOLN
0.0000 [IU] | Freq: Three times a day (TID) | INTRAMUSCULAR | Status: DC
  Administered 2022-03-23: 2 [IU] via SUBCUTANEOUS
  Administered 2022-03-23: 5 [IU] via SUBCUTANEOUS
  Administered 2022-03-25: 2 [IU] via SUBCUTANEOUS

## 2022-03-23 MED ORDER — INSULIN GLARGINE-YFGN 100 UNIT/ML ~~LOC~~ SOLN
16.0000 [IU] | Freq: Two times a day (BID) | SUBCUTANEOUS | Status: DC
  Administered 2022-03-23 – 2022-03-25 (×5): 16 [IU] via SUBCUTANEOUS
  Filled 2022-03-23 (×5): qty 0.16

## 2022-03-23 MED ORDER — INSULIN ASPART 100 UNIT/ML IJ SOLN
3.0000 [IU] | Freq: Three times a day (TID) | INTRAMUSCULAR | Status: DC
  Administered 2022-03-23 – 2022-03-25 (×5): 3 [IU] via SUBCUTANEOUS

## 2022-03-23 NOTE — Progress Notes (Signed)
PROGRESS NOTE  Kelli Hudson JSE:[id number] DOB: 1990/02/24   PCP: Loyola Mast, MD  Patient is from: Home.  DOA: 03/21/2022 LOS: 1  Chief complaints Chief Complaint  Patient presents with   Emesis     Brief Narrative / Interim history: 32 year old F with PMH of DM-1 with severe gastroparesis and peripheral neuropathy, HTN and HLD admitted with intractable nausea, vomiting and abdominal pain.  Workup in ED with mild leukocytosis.  Urine pregnancy test negative.  Started on IV fluid, IM Tigan (due to prolonged QT) and IV analgesics.  The next day, patient continued to endorse significant nausea, vomiting and epigastric abdominal pain.  CT abdomen and pelvis showed circumferential wall thickening involving the imaged portion of distal esophagus mild wall thickening involving the descending colon and rectum with pericolonic fat stranding or free fluid, and prominent appendix up to 8 mm with gas throughout the lumen but no periappendiceal fat stranding to suggest acute appendicitis.   Patient's GI symptoms improving.  QTc is resolved.  Started on IV Reglan and clear liquid diet.   Subjective: Seen and examined earlier this morning.  No major events overnight of this morning.  Reports improvement in her pain.  She rates her pain 7/10.  She reports nausea but no vomiting.  No bowel movement yet.  She denies UTI symptoms.  Objective: Vitals:   03/22/22 2013 03/23/22 0009 03/23/22 0407 03/23/22 0546  BP: 112/74 (!) 144/98 128/87 107/68  Pulse: 100 98 97 92  Resp: 18 18 18    Temp: 98.4 F (36.9 C) 97.9 F (36.6 C) 97.9 F (36.6 C)   TempSrc: Oral Oral Oral   SpO2: 100% 97% 98%   Weight:        Examination:  GENERAL: No apparent distress.  Nontoxic. HEENT: MMM.  Vision and hearing grossly intact.  NECK: Supple.  No apparent JVD.  RESP:  No IWOB.  Fair aeration bilaterally. CVS:  RRR. Heart sounds normal.  ABD/GI/GU: BS+.  Diffuse tenderness but no rebound MSK/EXT:   Moves extremities. No apparent deformity. No edema.  SKIN: Skin exfoliation on BLE NEURO: Awake and alert. Oriented appropriately.  No apparent focal neuro deficit. PSYCH: Calm. Normal affect.   Procedures:  None  Microbiology summarized: None  Assessment and plan: Principal Problem:   Intractable nausea and vomiting Active Problems:   Diabetic gastroparesis associated with type 1 diabetes mellitus (HCC)   Hypertension associated with diabetes (HCC)   Uncontrolled type 1 diabetes mellitus with hyperglycemia, with long-term current use of insulin (HCC)   HLD (hyperlipidemia)   Prolonged Q-T interval on ECG  Intractable nausea, vomiting and abdominal pain likely due to severe gastroparesis: Known history of severe gastroparesis due to uncontrolled type 1 diabetes.  Unfortunately, not able to use Reglan due to markedly prolonged QT.  Lipase and LFTs within normal.  Urine pregnancy test negative.  Low suspicion for infectious process.  Mild leukocytosis likely demargination from stress.  Previously hospitalized for the same from 1/16-1/18, and discharged on scheduled Reglan.  CT abdomen and pelvis suggestive esophagitis.  Diffuse tenderness on exam but no rebound.  UDS with opiates.  Abdominal pain improved.  Still with nausea but no emesis.  QTc improved to 368 generalized tachycardia. -Continue IV fluid -Start IV Reglan 5 mg ACHS -Start clear liquid diet -Continue IV Protonix for now -Start p.o. oxycodone 5 mg every 6 hours as needed moderate pain -Decrease IV Dilaudid from 1 mg to to 0.5 mg every every 3 hours. -Patient has been advised  to be cautious with opiates since it could make gastroparesis worse.  gastroparesis worse  AKI: Likely due to the above.  Resolving. Recent Labs    02/17/22 1114 03/04/22 0714 03/04/22 1900 03/04/22 2310 03/05/22 0158 03/05/22 0646 03/06/22 0737 03/21/22 1605 03/22/22 0618 03/23/22 0651  BUN 11 22* 23* 21* 21* 20 19 31* 30* 25*  CREATININE  1.03* 1.28* 1.28* 1.13* 1.19* 1.22* 1.26* 1.35* 1.74* 1.18*  -Continue IV fluid -Continue monitoring -Strict intake and output  Uncontrolled type 1 diabetes with hyperglycemia and hyperglycemia, severe gastroparesis and neuropathy: A1c 10.3% in 09/2021.  Hyperglycemia likely due to NPO. Recent Labs  Lab 03/22/22 2016 03/23/22 0012 03/23/22 0111 03/23/22 0409 03/23/22 0753  GLUCAP 171* 65* 104* 116* 200*  -Continue Semglee 16 units twice daily -Change SSI moderate from every 4 hours to ACHS -Start NovoLog 3 units 3 times daily with meals -Recheck hemoglobin A1c  Normocytic anemia: H&H at baseline. Recent Labs    12/29/21 0644 01/03/22 1844 01/04/22 0601 02/16/22 0946 02/17/22 1114 03/04/22 0714 03/05/22 0250 03/21/22 1605 03/22/22 0618 03/23/22 0651  HGB 9.9* 11.7* 9.9* 10.4* 11.1* 12.7 8.6* 12.5 10.9* 10.0*  -Continue monitoring -Check anemia panel in the morning   Essential hypertension/sinus tachycardia: Improved. -Continue IV metoprolol 5 mg every 6 hours with holding parameters. -Pain control as above   Prolonged QTc: Resolved.  QTc down to 368. -IV metoprolol as above for tachycardia -Monitor QTc intermittently -Optimize electrolytes-add KCl to IV fluid.  IV magnesium 2 g x 1.   Hyperlipidemia: -Hold statin for now.  Bilateral lower extremity skin exfoliation: Reports picking on her skin.  No signs of infection. -WOCN consulted  Body mass index is 26.82 kg/m.          DVT prophylaxis:  Place and maintain sequential compression device Start: 03/22/22 0900  Code Status: Full code Family Communication: None at bedside Level of care: Telemetry Status is: Inpatient. The patient will remain inpatient because: Intractable nausea, vomiting, abdominal pain   Final disposition: Likely home once medically stable Consultants:  None  55 minutes with more than 50% spent in reviewing records, counseling patient/family and coordinating care.   Sch Meds:   Scheduled Meds:  atorvastatin  20 mg Oral Daily   gabapentin  300 mg Oral TID   insulin aspart  0-15 Units Subcutaneous TID WC   insulin aspart  0-5 Units Subcutaneous QHS   insulin aspart  3 Units Subcutaneous TID WC   insulin glargine-yfgn  16 Units Subcutaneous BID   metoCLOPramide (REGLAN) injection  5 mg Intravenous TID AC & HS   metoprolol tartrate  5 mg Intravenous Q6H   pantoprazole (PROTONIX) IV  40 mg Intravenous Q12H   sodium chloride flush  3 mL Intravenous Q12H   Continuous Infusions:  lactated ringers 1,000 mL with potassium chloride 20 mEq infusion     magnesium sulfate bolus IVPB     PRN Meds:.acetaminophen **OR** acetaminophen, diphenhydrAMINE, HYDROmorphone (DILAUDID) injection, oxyCODONE, senna-docusate  Antimicrobials: Anti-infectives (From admission, onward)    None        I have personally reviewed the following labs and images: CBC: Recent Labs  Lab 03/21/22 1605 03/22/22 0618 03/23/22 0651  WBC 12.3* 14.1* 9.2  NEUTROABS 10.5*  --   --   HGB 12.5 10.9* 10.0*  HCT 38.1 34.2* 31.5*  MCV 86.2 89.1 91.0  PLT 556* 479* 395   BMP &GFR Recent Labs  Lab 03/21/22 1605 03/22/22 0618 03/23/22 0651  NA 136 140 138  K 3.7 3.8 3.6  CL 100 104 103  CO2 24 24 22   GLUCOSE 264* 211* 186*  BUN 31* 30* 25*  CREATININE 1.35* 1.74* 1.18*  CALCIUM 10.0 9.6 9.0  MG  --   --  1.8  PHOS  --   --  4.5   Estimated Creatinine Clearance: 77.4 mL/min (A) (by C-G formula based on SCr of 1.18 mg/dL (H)). Liver & Pancreas: Recent Labs  Lab 03/21/22 1605 03/23/22 0651  AST 27 19  ALT 21 15  ALKPHOS 113 70  BILITOT 0.9 0.9  PROT 8.8* 6.0*  ALBUMIN 3.6 2.5*   Recent Labs  Lab 03/21/22 1605  LIPASE 30   No results for input(s): "AMMONIA" in the last 168 hours. Diabetic: No results for input(s): "HGBA1C" in the last 72 hours. Recent Labs  Lab 03/22/22 2016 03/23/22 0012 03/23/22 0111 03/23/22 0409 03/23/22 0753  GLUCAP 171* 65* 104* 116* 200*    Cardiac Enzymes: No results for input(s): "CKTOTAL", "CKMB", "CKMBINDEX", "TROPONINI" in the last 168 hours. No results for input(s): "PROBNP" in the last 8760 hours. Coagulation Profile: No results for input(s): "INR", "PROTIME" in the last 168 hours. Thyroid Function Tests: No results for input(s): "TSH", "T4TOTAL", "FREET4", "T3FREE", "THYROIDAB" in the last 72 hours. Lipid Profile: No results for input(s): "CHOL", "HDL", "LDLCALC", "TRIG", "CHOLHDL", "LDLDIRECT" in the last 72 hours. Anemia Panel: No results for input(s): "VITAMINB12", "FOLATE", "FERRITIN", "TIBC", "IRON", "RETICCTPCT" in the last 72 hours. Urine analysis:    Component Value Date/Time   COLORURINE YELLOW 03/21/2022 0944   APPEARANCEUR CLOUDY (A) 03/21/2022 0944   LABSPEC 1.018 03/21/2022 0944   PHURINE 5.0 03/21/2022 0944   GLUCOSEU >=500 (A) 03/21/2022 0944   GLUCOSEU NEGATIVE 05/22/2020 1417   HGBUR SMALL (A) 03/21/2022 0944   BILIRUBINUR NEGATIVE 03/21/2022 0944   KETONESUR 5 (A) 03/21/2022 0944   PROTEINUR >=300 (A) 03/21/2022 0944   UROBILINOGEN 0.2 05/22/2020 1417   NITRITE NEGATIVE 03/21/2022 0944   LEUKOCYTESUR TRACE (A) 03/21/2022 0944   Sepsis Labs: Invalid input(s): "PROCALCITONIN", "LACTICIDVEN"  Microbiology: No results found for this or any previous visit (from the past 240 hour(s)).  Radiology Studies: CT ABDOMEN PELVIS W CONTRAST  Result Date: 03/22/2022 CLINICAL DATA:  Abdominal pain, acute. Emesis. EXAM: CT ABDOMEN AND PELVIS WITH CONTRAST TECHNIQUE: Multidetector CT imaging of the abdomen and pelvis was performed using the standard protocol following bolus administration of intravenous contrast. RADIATION DOSE REDUCTION: This exam was performed according to the departmental dose-optimization program which includes automated exposure control, adjustment of the mA and/or kV according to patient size and/or use of iterative reconstruction technique. CONTRAST:  167mL OMNIPAQUE IOHEXOL 300  MG/ML  SOLN COMPARISON:  08/24/2021 FINDINGS: Lower chest: No acute abnormality. Hepatobiliary: No focal liver abnormality is seen. No gallstones, gallbladder wall thickening, or biliary dilatation. Pancreas: Unremarkable. No pancreatic ductal dilatation or surrounding inflammatory changes. Spleen: Normal in size without focal abnormality. Adrenals/Urinary Tract: Normal adrenal glands. No kidney stone, hydronephrosis, or mass. Urinary bladder is unremarkable. Stomach/Bowel: There is circumferential wall thickening involving the imaged portions of the distal esophagus. The stomach appears nondistended. The duodenum is normal. No small bowel wall thickening, inflammation, or distension. Similar to the past two exams the appendix is prominent measuring up to 8 mm. Gas is noted throughout the lumen of the appendix however and there is no periappendiceal fat stranding. Mild wall thickening involving the descending colon is noted without significant pericolonic inflammatory fat strain or free fluid. There is also mild wall  thickening involving the rectum. Vascular/Lymphatic: No significant vascular findings are present. No enlarged abdominal or pelvic lymph nodes. Reproductive: Uterus and bilateral adnexa are unremarkable. Other: No free fluid or fluid collections. No signs of pneumoperitoneum. Musculoskeletal: No acute or significant osseous findings. IMPRESSION: 1. There is circumferential wall thickening involving the imaged portions of the distal esophagus. Correlate for any clinical signs or symptoms of esophagitis and gastritis. 2. Mild wall thickening involving the descending colon and rectum. No signs of pericolonic fat stranding or free fluid. Findings may reflect incomplete distention or mild colitis. 3. The appendix is prominent measuring up to 8 mm. Gas is noted throughout the lumen of the appendix however and there is no periappendiceal fat stranding to suggest acute appendicitis. This likely represents a  normal anatomic variant. Electronically Signed   By: Kerby Moors M.D.   On: 03/22/2022 12:20      Gerhart Ruggieri T. [facility]  If 7PM-7AM, please contact night-coverage [email or web address] 03/23/2022, 10:40 AM

## 2022-03-23 NOTE — Consult Note (Signed)
WOC Nurse Consult Note: Reason for Consult:Lower extremity wounds (3), chronic, nonhealing Wound type: neuropathic vs venous insufficiency vs trauma Pressure Injury POA: N/A Measurement:Per Nursing flow sheet today, by Wound Treatment Associate (Certified, WTA-C) P. Tasia Catchings, Therapist, sports. Her expert assistance is appreciated. Right pretibial:2.5cm x 2cm x 0.1cm with clean pink wound bed and scant serous exudate Right ankle:3cm x 2cm x 0.1cm with clean pink wound base and scant serous exudate Left ankle:1cm x 0.5cm x 0.1cm with clean dry wound bed and no exudate Wound bed:As described above Drainage (amount, consistency, odor) As described above Periwound:intact Dressing procedure/placement/frequency:I have provided nursing with guidance for topical care via the Orders using a daily cleanse and pat dry followed by covering the wounds with folded layers of xeroform (antimicrobial, nonadherent) gauze topped with dry gauze and secured with silicone foam. Heels are to be floated off of bed surface and a silicone foam placed prophylactically to the sacrum.  [place] nursing team will not follow, but will remain available to this patient, the nursing and medical teams.  Please re-consult if needed.  Thank you for inviting [place] to participate in this patient's Plan of Care.  Maudie Flakes, MSN, RN, CNS, [place], Serita Grammes, [employer], [employer] phone:  [phone]

## 2022-03-24 DIAGNOSIS — E1065 Type 1 diabetes mellitus with hyperglycemia: Secondary | ICD-10-CM | POA: Diagnosis not present

## 2022-03-24 DIAGNOSIS — R112 Nausea with vomiting, unspecified: Secondary | ICD-10-CM | POA: Diagnosis not present

## 2022-03-24 DIAGNOSIS — E1159 Type 2 diabetes mellitus with other circulatory complications: Secondary | ICD-10-CM | POA: Diagnosis not present

## 2022-03-24 DIAGNOSIS — R9431 Abnormal electrocardiogram [ECG] [EKG]: Secondary | ICD-10-CM | POA: Diagnosis not present

## 2022-03-24 LAB — CBC
HCT: 33.7 % — ABNORMAL LOW (ref 36.0–46.0)
Hemoglobin: 10.1 g/dL — ABNORMAL LOW (ref 12.0–15.0)
MCH: 28.1 pg (ref 26.0–34.0)
MCHC: 30 g/dL (ref 30.0–36.0)
MCV: 93.9 fL (ref 80.0–100.0)
Platelets: 394 10*3/uL (ref 150–400)
RBC: 3.59 MIL/uL — ABNORMAL LOW (ref 3.87–5.11)
RDW: 14.1 % (ref 11.5–15.5)
WBC: 7.9 10*3/uL (ref 4.0–10.5)
nRBC: 0 % (ref 0.0–0.2)

## 2022-03-24 LAB — RETICULOCYTES
Immature Retic Fract: 15.7 % (ref 2.3–15.9)
RBC.: 3.58 MIL/uL — ABNORMAL LOW (ref 3.87–5.11)
Retic Count, Absolute: 80.9 10*3/uL (ref 19.0–186.0)
Retic Ct Pct: 2.3 % (ref 0.4–3.1)

## 2022-03-24 LAB — COMPREHENSIVE METABOLIC PANEL
ALT: 15 U/L (ref 0–44)
AST: 21 U/L (ref 15–41)
Albumin: 2.6 g/dL — ABNORMAL LOW (ref 3.5–5.0)
Alkaline Phosphatase: 66 U/L (ref 38–126)
Anion gap: 9 (ref 5–15)
BUN: 14 mg/dL (ref 6–20)
CO2: 23 mmol/L (ref 22–32)
Calcium: 9.3 mg/dL (ref 8.9–10.3)
Chloride: 102 mmol/L (ref 98–111)
Creatinine, Ser: 0.85 mg/dL (ref 0.44–1.00)
GFR, Estimated: >60 mL/min (ref >60)
Glucose, Bld: 103 mg/dL — ABNORMAL HIGH (ref 70–99)
Potassium: 4.5 mmol/L (ref 3.5–5.1)
Sodium: 134 mmol/L — ABNORMAL LOW (ref 135–145)
Total Bilirubin: 0.7 mg/dL (ref 0.3–1.2)
Total Protein: 6 g/dL — ABNORMAL LOW (ref 6.5–8.1)

## 2022-03-24 LAB — IRON AND TIBC
Iron: 78 ug/dL (ref 28–170)
Saturation Ratios: 19 % (ref 10.4–31.8)
TIBC: 417 ug/dL (ref 250–450)
UIBC: 339 ug/dL

## 2022-03-24 LAB — VITAMIN B12: Vitamin B-12: 771 pg/mL (ref 180–914)

## 2022-03-24 LAB — GLUCOSE, CAPILLARY
Glucose-Capillary: 108 mg/dL — ABNORMAL HIGH (ref 70–99)
Glucose-Capillary: 115 mg/dL — ABNORMAL HIGH (ref 70–99)
Glucose-Capillary: 116 mg/dL — ABNORMAL HIGH (ref 70–99)
Glucose-Capillary: 197 mg/dL — ABNORMAL HIGH (ref 70–99)
Glucose-Capillary: 84 mg/dL (ref 70–99)
Glucose-Capillary: 93 mg/dL (ref 70–99)

## 2022-03-24 LAB — FERRITIN: Ferritin: 18 ng/mL (ref 11–307)

## 2022-03-24 LAB — PHOSPHORUS: Phosphorus: 3.6 mg/dL (ref 2.5–4.6)

## 2022-03-24 LAB — FOLATE: Folate: 12 ng/mL (ref >5.9)

## 2022-03-24 LAB — MAGNESIUM: Magnesium: 1.8 mg/dL (ref 1.7–2.4)

## 2022-03-24 MED ORDER — CARVEDILOL 12.5 MG PO TABS
12.5000 mg | ORAL_TABLET | Freq: Two times a day (BID) | ORAL | Status: DC
  Administered 2022-03-24 – 2022-03-25 (×2): 12.5 mg via ORAL
  Filled 2022-03-24 (×2): qty 1

## 2022-03-24 MED ORDER — OXYCODONE HCL 5 MG PO TABS
5.0000 mg | ORAL_TABLET | Freq: Three times a day (TID) | ORAL | Status: DC | PRN
  Administered 2022-03-24: 5 mg via ORAL
  Filled 2022-03-24: qty 1

## 2022-03-24 MED ORDER — SENNOSIDES-DOCUSATE SODIUM 8.6-50 MG PO TABS: 1.0000 | ORAL_TABLET | Freq: Two times a day (BID) | ORAL | Status: DC | PRN

## 2022-03-24 MED ORDER — PANTOPRAZOLE SODIUM 40 MG PO TBEC
40.0000 mg | DELAYED_RELEASE_TABLET | Freq: Two times a day (BID) | ORAL | Status: DC
  Administered 2022-03-24 – 2022-03-25 (×2): 40 mg via ORAL
  Filled 2022-03-24 (×2): qty 1

## 2022-03-24 MED ORDER — SODIUM CHLORIDE 0.9 % IV SOLN
25.0000 mg | Freq: Once | INTRAVENOUS | Status: AC
  Administered 2022-03-24: 25 mg via INTRAVENOUS
  Filled 2022-03-24: qty 0.5

## 2022-03-24 MED ORDER — HYDROMORPHONE HCL 1 MG/ML IJ SOLN
0.5000 mg | INTRAMUSCULAR | Status: DC | PRN
  Administered 2022-03-24 (×3): 0.5 mg via INTRAVENOUS
  Filled 2022-03-24 (×3): qty 0.5

## 2022-03-24 MED ORDER — SODIUM CHLORIDE 0.9 % IV SOLN
1000.0000 mg | Freq: Once | INTRAVENOUS | Status: AC
  Administered 2022-03-24: 1000 mg via INTRAVENOUS
  Filled 2022-03-24: qty 20

## 2022-03-24 NOTE — TOC Initial Note (Signed)
Transition of Care [facility]) - Initial/Assessment Note    Patient Details  Name: Kelli Hudson MRN: [id number] Date of Birth: 1991/01/12  Transition of Care [facility]) CM/SW Contact:    Angelita Ingles, RN Phone [phone]  03/24/2022, 3:45 PM  Clinical Narrative:                 TOC assessment for patient identified as high risk for readmission. Patient is from home where she lives with her boyfriend. Patient states that she normally functions independently at home with no DME or home health services. Patient states that her PCP is Dr Arlester Marker. [facility] [address]. Patient states that she does have access to her medications and that they are affordable. Patient repots that she does have transportation for appointments. Patient states that ashe does feel safe in her current living environment and that she has no immediate needs. TOC following for any disposition needs.   Expected Discharge Plan: Home/Self Care Barriers to Discharge: Continued Medical Work up   Patient Goals and CMS Choice Patient states their goals for this hospitalization and ongoing recovery are:: Wants to go home CMS Medicare.gov Compare Post Acute Care list provided to::  (n/a) Choice offered to / list presented to : NA [place] ownership interest in [facility].provided to::  (n/a)    Expected Discharge Plan and Services In-house Referral: NA Discharge Planning Services: CM Consult Post Acute Care Choice: NA Living arrangements for the past 2 months: Apartment                 DME Arranged: N/A DME Agency: NA       HH Arranged: NA [place] Agency: NA        Prior Living Arrangements/Services Living arrangements for the past 2 months: Apartment Lives with:: Significant Other Patient language and need for interpreter reviewed:: Yes Do you feel safe going back to the place where you live?: Yes      Need for Family Participation in Patient Care: No  (Comment) Care giver support system in place?: Yes (comment) Current home services:  (n/a) Criminal Activity/Legal Involvement Pertinent to Current Situation/Hospitalization: No - Comment as needed  Activities of Daily Living Home Assistive Devices/Equipment: None ADL Screening (condition at time of admission) Patient's cognitive ability adequate to safely complete daily activities?: Yes Is the patient deaf or have difficulty hearing?: No Does the patient have difficulty seeing, even when wearing glasses/contacts?: No Does the patient have difficulty concentrating, remembering, or making decisions?: No Patient able to express need for assistance with ADLs?: Yes Does the patient have difficulty dressing or bathing?: No Independently performs ADLs?: Yes (appropriate for developmental age) Does the patient have difficulty walking or climbing stairs?: No Weakness of Legs: Both Weakness of Arms/Hands: None  Permission Sought/Granted Permission sought to share information with : Family Supports Permission granted to share information with : No              Emotional Assessment Appearance:: Appears stated age Attitude/Demeanor/Rapport: Gracious Affect (typically observed): Pleasant, Quiet, Appropriate Orientation: : Oriented to Self, Oriented to Place, Oriented to  Time, Oriented to Situation Alcohol / Substance Use: Not Applicable Psych Involvement: No (comment)  Admission diagnosis:  Gastroparesis [K31.84] Intractable vomiting [R11.10] Pain of upper abdomen [R10.10] Intractable nausea and vomiting [R11.2] Patient Active Problem List   Diagnosis Date Noted   DKA (diabetic ketoacidosis) ([place]) 03/04/2022   Refractory nausea and vomiting 12/25/2021   Diabetic gastroparesis ([place]) 12/07/2021  Intractable nausea and vomiting 10/09/2021   Drug-seeking behavior 09/21/2021   Essential hypertension 08/25/2021   GERD without esophagitis 08/25/2021   Prolonged Q-T interval on ECG     Nausea and vomiting 07/20/2021   Hypomagnesemia 04/21/2021   Ulcerated, foot, left, with fat layer exposed ([place]) 04/20/2021   Uncontrolled type 1 diabetes mellitus with hyperglycemia, with long-term current use of insulin ([place]) 12/18/2020   DM type 1 (diabetes mellitus, type 1) ([place]) 12/18/2020   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 ([place]) 11/09/2020   Diabetic retinopathy of both eyes associated with type 1 diabetes mellitus ([place]) 09/24/2020   Diabetic gastroparesis associated with type 1 diabetes mellitus ([place]) 08/12/2020   Generalized abdominal pain 07/23/2020   Diabetic nephropathy associated with type 1 diabetes mellitus ([place]) 06/27/2020   HLD (hyperlipidemia) 05/23/2020   Sinus tachycardia 05/09/2020   Normocytic anemia 04/20/2020   Depression with anxiety 07/05/2019   Diabetic polyneuropathy associated with type 1 diabetes mellitus ([place]) 09/24/2017   Gastroparesis    Goiter 10/05/2009   Hypertension associated with diabetes ([place]) 10/05/2009   PCP:  [name], MD Pharmacy[facility]   [id number] [place], [place] [address] Phone: [phone] Fax: [phone]     Social Determinants of Health (SDOH) Social History: SDOH Screenings   Food Insecurity: No Food Insecurity (03/22/2022)  Housing: Low Risk  (03/22/2022)  Transportation Needs: No Transportation Needs (03/22/2022)  Utilities: Not At Risk (03/22/2022)  Alcohol Screen: Low Risk  (05/29/2021)  Depression (PHQ2-9): Low Risk  (05/29/2021)  Recent Concern: Depression (PHQ2-9) - Medium Risk (04/10/2021)  Financial Resource Strain: Low Risk  (05/29/2021)  Social Connections: Moderately Integrated (05/29/2021)  Tobacco Use: Low Risk  (03/21/2022)   SDOH Interventions:     Readmission Risk Interventions    03/24/2022    3:33 PM 12/12/2021    8:59 AM 12/04/2021    9:52 AM  Readmission Risk Prevention Plan  Transportation Screening  Complete Complete Complete  Medication Review Press photographer) Complete Complete Complete  PCP or Specialist appointment within 3-5 days of discharge Complete Complete Complete  HRI or Home Care Consult Complete Complete Complete  SW Recovery Care/Counseling Consult Complete Complete Complete  Palliative Care Screening Not Applicable Not Applicable Not [place] Not Applicable Not Applicable Not Applicable

## 2022-03-24 NOTE — TOC Progression Note (Signed)
Transition of Care [facility]) - Progression Note    Patient Details  Name: Kelli Hudson MRN: [id number] Date of Birth: 08-24-1990  Transition of Care [facility]) CM/SW [place], RN Phone [phone]  03/24/2022, 1:25 PM  Clinical Narrative:     Transition of Care (TOC) Screening Note   Patient Details  Name: [name] Date of Birth: [birth year]   Transition of Care [facility]) CM/SW Contact:    Angelita Ingles, RN Phone Number: 03/24/2022, 1:25 PM    Transition of Care Department [facility]) has reviewed patient and no TOC needs have been identified at this time. We will continue to monitor patient advancement through interdisciplinary progression rounds. If new patient transition needs arise, please place a TOC consult.          Expected Discharge Plan and Services                                               Social Determinants of Health (SDOH) Interventions SDOH Screenings   Food Insecurity: No Food Insecurity (03/22/2022)  Housing: Low Risk  (03/22/2022)  Transportation Needs: No Transportation Needs (03/22/2022)  Utilities: Not At Risk (03/22/2022)  Alcohol Screen: Low Risk  (05/29/2021)  Depression (PHQ2-9): Low Risk  (05/29/2021)  Recent Concern: Depression (PHQ2-9) - Medium Risk (04/10/2021)  Financial Resource Strain: Low Risk  (05/29/2021)  Social Connections: Moderately Integrated (05/29/2021)  Tobacco Use: Low Risk  (03/21/2022)    Readmission Risk Interventions    12/12/2021    8:59 AM 12/04/2021    9:52 AM 11/06/2021   12:31 PM  Readmission Risk Prevention Plan  Transportation Screening Complete Complete Complete  Medication Review Press photographer) Complete Complete Complete  PCP or Specialist appointment within 3-5 days of discharge Complete Complete Complete  HRI or Home Care Consult Complete Complete Complete  SW Recovery Care/Counseling Consult Complete Complete Complete  Palliative Care Screening Not Applicable Not  Applicable Not [place] Not Applicable Not Applicable Not Applicable

## 2022-03-24 NOTE — Progress Notes (Signed)
PROGRESS NOTE  Kelli Hudson LKG:[id number] DOB: Feb 09, 1991   PCP: [name], MD  Patient is from: Home.  DOA: 03/21/2022 LOS: 2  Chief complaints Chief Complaint  Patient presents with   Emesis     Brief Narrative / Interim history: 32 year old F with PMH of DM-1 with severe gastroparesis and peripheral neuropathy, HTN and HLD admitted with intractable nausea, vomiting and abdominal pain.  Workup in ED with mild leukocytosis.  Urine pregnancy test negative.  Started on IV fluid, IM Tigan (due to prolonged QT) and IV analgesics.  The next day, patient continued to endorse significant nausea, vomiting and epigastric abdominal pain.  CT abdomen and pelvis showed circumferential wall thickening involving the imaged portion of distal esophagus mild wall thickening involving the descending colon and rectum with pericolonic fat stranding or free fluid, and prominent appendix up to 8 mm with gas throughout the lumen but no periappendiceal fat stranding to suggest acute appendicitis.   Patient's GI symptoms improving.  QTc is resolved.  Started on IV Reglan and advancing diet.   Subjective: Seen and examined earlier this morning.  No major events overnight of this morning.  Reports feeling better.  Reports significant improvement in her pain but rates her pain 8/10.  When I told her this is severe pain, she says 6/10.  Nausea improved.  No emesis.  Objective: Vitals:   03/23/22 1528 03/23/22 2054 03/23/22 2339 03/24/22 0408  BP: (!) 137/94 (!) 127/95 137/88 (!) 149/97  Pulse: 94 92 98 99  Resp: 16   18  Temp: 98.5 F (36.9 C) 98.1 F (36.7 C)  98 F (36.7 C)  TempSrc: Oral Oral  Oral  SpO2: 99% 99%  98%  Weight:        Examination:  GENERAL: No apparent distress.  Nontoxic. HEENT: MMM.  Vision and hearing grossly intact.  NECK: Supple.  No apparent JVD.  RESP:  No IWOB.  Fair aeration bilaterally. CVS:  RRR. Heart sounds normal.  ABD/GI/GU: BS+. Abd soft.  Mild  diffuse tenderness.  No rebound or guarding. MSK/EXT:   No apparent deformity. Moves extremities. No edema.  SKIN: no apparent skin lesion or wound NEURO: Awake and alert. Oriented appropriately.  No apparent focal neuro deficit. PSYCH: Calm. Normal affect.   Procedures:  None  Microbiology summarized: None  Assessment and plan: Principal Problem:   Intractable nausea and vomiting Active Problems:   Diabetic gastroparesis associated with type 1 diabetes mellitus ([place])   Hypertension associated with diabetes ([place])   Uncontrolled type 1 diabetes mellitus with hyperglycemia, with long-term current use of insulin ([facility])   HLD (hyperlipidemia)   Prolonged Q-T interval on ECG  Intractable nausea, vomiting and abdominal pain likely due to severe gastroparesis: Known history of severe gastroparesis due to uncontrolled type 1 diabetes.  Unfortunately, not able to use Reglan due to markedly prolonged QT.  Lipase and LFTs within normal.  Urine pregnancy test negative.  Low suspicion for infectious process.  Mild leukocytosis likely demargination from stress.  Previously hospitalized for the same from 1/16-1/18, and discharged on scheduled Reglan.  CT abdomen and pelvis suggestive esophagitis.  Diffuse tenderness on exam but no rebound.  UDS with opiates.  Abdominal pain improved.  Still with nausea but no emesis.  QTc improved to 368 generalized tachycardia. -Discontinue IV fluid -Continue IV Reglan 5 mg ACHS -Advance diet to soft low fiber residue -Change Protonix to p.o. -Space out p.o. oxycodone 5 mg to every 8 hours as needed moderate pain -  Space out IV Dilaudid to every 4 hours as needed severe pain -Patient has been advised to be cautious with opiates since it could make gastroparesis worse.  gastroparesis worse  AKI: Likely due to the above.  Resolved Recent Labs    03/04/22 0714 03/04/22 1900 03/04/22 2310 03/05/22 0158 03/05/22 0646 03/06/22 0737 03/21/22 1605 03/22/22 0618  03/23/22 0651 03/24/22 0553  BUN 22* 23* 21* 21* 20 19 31* 30* 25* 14  CREATININE 1.28* 1.28* 1.13* 1.19* 1.22* 1.26* 1.35* 1.74* 1.18* 0.85  -Discontinue IV fluid -Continue monitoring  Uncontrolled type 1 diabetes with hyperglycemia and hyperglycemia, severe gastroparesis and neuropathy: A1c 11.2% (10.2% in 09/2021).  Hypoglycemia resolved. Recent Labs  Lab 03/23/22 1600 03/23/22 2056 03/24/22 0018 03/24/22 0409 03/24/22 0726  GLUCAP 131* 93 93 116* 84  -Continue Semglee 16 units twice daily -Continue SSI-moderate -Continue NovoLog 3 units 3 times daily with meals -Diet advanced to soft low fiber residue diet  Iron deficiency anemia: H&H at baseline.  Anemia panel suggests some degree of iron deficiency Recent Labs    01/03/22 1844 01/04/22 0601 02/16/22 0946 02/17/22 1114 03/04/22 0714 03/05/22 0250 03/21/22 1605 03/22/22 0618 03/23/22 0651 03/24/22 0553  HGB 11.7* 9.9* 10.4* 11.1* 12.7 8.6* 12.5 10.9* 10.0* 10.1*  -Continue monitoring -IV iron dextran after test dose.   Essential hypertension/sinus tachycardia: Improved. -Discontinue IV metoprolol. -Resume home Coreg. -Pain control as above   Prolonged QTc: Resolved.  QTc down to 368. -Continue home Coreg -Monitor QTc intermittently -Optimize electrolytes   Hyperlipidemia: -Hold statin for now.  Bilateral lower extremity skin exfoliation: Reports picking on her skin.  No signs of infection. -Appreciate input by WOCN.  Body mass index is 26.82 kg/m.          DVT prophylaxis:  Place and maintain sequential compression device Start: 03/22/22 0900  Code Status: Full code Family Communication: None at bedside Level of care: Med-Surg Status is: Inpatient. The patient will remain inpatient because: Intractable nausea, vomiting, abdominal pain   Final disposition: Likely home on 2/6. Consultants:  None  35 minutes with more than 50% spent in reviewing records, counseling patient/family and  coordinating care.   Sch Meds:  Scheduled Meds:  atorvastatin  20 mg Oral Daily   carvedilol  12.5 mg Oral BID WC   gabapentin  300 mg Oral TID   insulin aspart  0-15 Units Subcutaneous TID WC   insulin aspart  0-5 Units Subcutaneous QHS   insulin aspart  3 Units Subcutaneous TID WC   insulin glargine-yfgn  16 Units Subcutaneous BID   metoCLOPramide (REGLAN) injection  5 mg Intravenous TID AC & HS   pantoprazole  40 mg Oral BID   sodium chloride flush  3 mL Intravenous Q12H   Continuous Infusions:  iron dextran (INFED/DEXFERRUM) infusion     Followed by   iron dextran (INFED/DEXFERRUM) infusion      PRN Meds:.acetaminophen **OR** acetaminophen, HYDROmorphone (DILAUDID) injection, oxyCODONE, senna-docusate  Antimicrobials: Anti-infectives (From admission, onward)    None        I have personally reviewed the following labs and images: CBC: Recent Labs  Lab 03/21/22 1605 03/22/22 0618 03/23/22 0651 03/24/22 0553  WBC 12.3* 14.1* 9.2 7.9  NEUTROABS 10.5*  --   --   --   HGB 12.5 10.9* 10.0* 10.1*  HCT 38.1 34.2* 31.5* 33.7*  MCV 86.2 89.1 91.0 93.9  PLT 556* 479* 395 394   BMP &GFR Recent Labs  Lab 03/21/22 1605 03/22/22 0618 03/23/22 7741  03/24/22 0553  NA 136 140 138 134*  K 3.7 3.8 3.6 4.5  CL 100 104 103 102  CO2 24 24 22 23   GLUCOSE 264* 211* 186* 103*  BUN 31* 30* 25* 14  CREATININE 1.35* 1.74* 1.18* 0.85  CALCIUM 10.0 9.6 9.0 9.3  MG  --   --  1.8 1.8  PHOS  --   --  4.5 3.6   Estimated Creatinine Clearance: 107.4 mL/min (by C-G formula based on SCr of 0.85 mg/dL). Liver & Pancreas: Recent Labs  Lab 03/21/22 1605 03/23/22 0651 03/24/22 0553  AST 27 19 21   ALT 21 15 15   ALKPHOS 113 70 66  BILITOT 0.9 0.9 0.7  PROT 8.8* 6.0* 6.0*  ALBUMIN 3.6 2.5* 2.6*   Recent Labs  Lab 03/21/22 1605  LIPASE 30   No results for input(s): "AMMONIA" in the last 168 hours. Diabetic: Recent Labs    03/23/22 0651  HGBA1C 11.2*   Recent Labs   Lab 03/23/22 1600 03/23/22 2056 03/24/22 0018 03/24/22 0409 03/24/22 0726  GLUCAP 131* 93 93 116* 84   Cardiac Enzymes: No results for input(s): "CKTOTAL", "CKMB", "CKMBINDEX", "TROPONINI" in the last 168 hours. No results for input(s): "PROBNP" in the last 8760 hours. Coagulation Profile: No results for input(s): "INR", "PROTIME" in the last 168 hours. Thyroid Function Tests: No results for input(s): "TSH", "T4TOTAL", "FREET4", "T3FREE", "THYROIDAB" in the last 72 hours. Lipid Profile: No results for input(s): "CHOL", "HDL", "LDLCALC", "TRIG", "CHOLHDL", "LDLDIRECT" in the last 72 hours. Anemia Panel: Recent Labs    03/24/22 0553  VITAMINB12 771  FOLATE 12.0  FERRITIN 18  TIBC 417  IRON 78  RETICCTPCT 2.3   Urine analysis:    Component Value Date/Time   COLORURINE YELLOW 03/21/2022 0944   APPEARANCEUR CLOUDY (A) 03/21/2022 0944   LABSPEC 1.018 03/21/2022 0944   PHURINE 5.0 03/21/2022 0944   GLUCOSEU >=500 (A) 03/21/2022 0944   GLUCOSEU NEGATIVE 05/22/2020 1417   HGBUR SMALL (A) 03/21/2022 0944   BILIRUBINUR NEGATIVE 03/21/2022 0944   KETONESUR 5 (A) 03/21/2022 0944   PROTEINUR >=300 (A) 03/21/2022 0944   UROBILINOGEN 0.2 05/22/2020 1417   NITRITE NEGATIVE 03/21/2022 0944   LEUKOCYTESUR TRACE (A) 03/21/2022 0944   Sepsis Labs: Invalid input(s): "PROCALCITONIN", "LACTICIDVEN"  Microbiology: No results found for this or any previous visit (from the past 240 hour(s)).  Radiology Studies: No results found.    [name] T. [place]  If 7PM-7AM, please contact night-coverage [email or web address] 03/24/2022, 10:33 AM

## 2022-03-25 DIAGNOSIS — R9431 Abnormal electrocardiogram [ECG] [EKG]: Secondary | ICD-10-CM | POA: Diagnosis not present

## 2022-03-25 DIAGNOSIS — E785 Hyperlipidemia, unspecified: Secondary | ICD-10-CM

## 2022-03-25 DIAGNOSIS — E1159 Type 2 diabetes mellitus with other circulatory complications: Secondary | ICD-10-CM | POA: Diagnosis not present

## 2022-03-25 DIAGNOSIS — R112 Nausea with vomiting, unspecified: Secondary | ICD-10-CM | POA: Diagnosis not present

## 2022-03-25 DIAGNOSIS — E1065 Type 1 diabetes mellitus with hyperglycemia: Secondary | ICD-10-CM | POA: Diagnosis not present

## 2022-03-25 LAB — CBC
HCT: 31.9 % — ABNORMAL LOW (ref 36.0–46.0)
Hemoglobin: 9.9 g/dL — ABNORMAL LOW (ref 12.0–15.0)
MCH: 28.2 pg (ref 26.0–34.0)
MCHC: 31 g/dL (ref 30.0–36.0)
MCV: 90.9 fL (ref 80.0–100.0)
Platelets: 404 10*3/uL — ABNORMAL HIGH (ref 150–400)
RBC: 3.51 MIL/uL — ABNORMAL LOW (ref 3.87–5.11)
RDW: 14 % (ref 11.5–15.5)
WBC: 9 10*3/uL (ref 4.0–10.5)
nRBC: 0 % (ref 0.0–0.2)

## 2022-03-25 LAB — RENAL FUNCTION PANEL
Albumin: 2.9 g/dL — ABNORMAL LOW (ref 3.5–5.0)
Anion gap: 9 (ref 5–15)
BUN: 18 mg/dL (ref 6–20)
CO2: 24 mmol/L (ref 22–32)
Calcium: 9.4 mg/dL (ref 8.9–10.3)
Chloride: 102 mmol/L (ref 98–111)
Creatinine, Ser: 1.09 mg/dL — ABNORMAL HIGH (ref 0.44–1.00)
GFR, Estimated: >60 mL/min (ref >60)
Glucose, Bld: 151 mg/dL — ABNORMAL HIGH (ref 70–99)
Phosphorus: 4.8 mg/dL — ABNORMAL HIGH (ref 2.5–4.6)
Potassium: 4.4 mmol/L (ref 3.5–5.1)
Sodium: 135 mmol/L (ref 135–145)

## 2022-03-25 LAB — GLUCOSE, CAPILLARY: Glucose-Capillary: 149 mg/dL — ABNORMAL HIGH (ref 70–99)

## 2022-03-25 LAB — MAGNESIUM: Magnesium: 2.2 mg/dL (ref 1.7–2.4)

## 2022-03-25 MED ORDER — METOCLOPRAMIDE HCL 5 MG PO TABS: 5.0000 mg | ORAL_TABLET | Freq: Three times a day (TID) | ORAL | 1 refills | Status: DC

## 2022-03-25 MED ORDER — LEVEMIR FLEXTOUCH 100 UNIT/ML ~~LOC~~ SOPN: 13.0000 [IU] | PEN_INJECTOR | Freq: Two times a day (BID) | SUBCUTANEOUS | 2 refills | Status: DC

## 2022-03-25 NOTE — Progress Notes (Signed)
Patient is being discharged home. Discharge instructions reviewed including changes to medications and wound care. Per pt she has been on insulin for years and is comfortable giving herself insulin. Wound care for lower leg extremities reviewed with patient and supplies given. Pt verbalized a full understanding of all teaching. Pt boyfriend is her ride home.

## 2022-03-25 NOTE — Discharge Summary (Signed)
Physician Discharge Summary  Kelli Hudson IRW:[id number] DOB: 1990-08-29 DOA: 03/21/2022  PCP: [name], MD  Admit date: 03/21/2022 Discharge date: 03/25/2022 Admitted From: Home Disposition: Home Recommendations for Outpatient Follow-up:  Follow up with PCP in 1 to 2 weeks or sooner if needed Check glycemic control, CMP and CBC at follow-up Please follow up on the following pending results: None  Home Health: Not indicated Equipment/Devices: Not indicated  Discharge Condition: Stable CODE STATUS: Full code  Follow-up Information     [name], MD. Schedule an appointment as soon as possible for a visit in 1 week(s).   Specialty: Family Medicine Contact information: [address] [id number] [facility] 32 year old F with PMH of DM-1 with severe gastroparesis and peripheral neuropathy, HTN and HLD admitted with intractable nausea, vomiting and abdominal pain.  Workup in ED with mild leukocytosis.  Urine pregnancy test negative.  Started on IV fluid, IM Tigan (due to prolonged QT) and IV analgesics.   The next day, patient continued to endorse significant nausea, vomiting and epigastric abdominal pain.  CT abdomen and pelvis showed circumferential wall thickening involving the imaged portion of distal esophagus mild wall thickening involving the descending colon and rectum with pericolonic fat stranding or free fluid, and prominent appendix up to 8 mm with gas throughout the lumen but no periappendiceal fat stranding to suggest acute appendicitis.    Patient's GI symptoms improving.  QTc is resolved.  Started on IV Reglan and advance diet.  On the day of discharge, GI symptoms resolved.  Tolerated soft low fiber residue diet.  Discharged on p.o. Reglan 5 mg ACHS.  She is already on p.o. Protonix twice daily.  Increased home Levemir from 16 units daily to 13 units twice daily for better glycemic  control.  See individual problem list below for more.   Problems addressed during this hospitalization Principal Problem:   Intractable nausea and vomiting Active Problems:   Diabetic gastroparesis associated with type 1 diabetes mellitus ([place])   Hypertension associated with diabetes ([place])   Uncontrolled type 1 diabetes mellitus with hyperglycemia, with long-term current use of insulin ([facility])   HLD (hyperlipidemia)   Prolonged Q-T interval on ECG   Intractable nausea, vomiting and abdominal pain likely due to severe gastroparesis: Known history of severe gastroparesis due to uncontrolled type 1 diabetes.  Unfortunately, not able to use Reglan due to markedly prolonged QT.  Lipase and LFTs within normal.  Urine pregnancy test negative.  Low suspicion for infectious process.  Mild leukocytosis likely demargination from stress.  Previously hospitalized for the same from 1/16-1/18, and discharged on scheduled Reglan.  CT abdomen and pelvis suggestive esophagitis.  UDS with opiates.  GI symptoms resolved.  Tolerated soft low fiber residue diet. -Advised to continue soft low fiber residue diet -P.o. Reglan 5 mg ACHS and instead of as needed -Continue p.o. Protonix twice daily -Increase Levemir for better glycemic control.   AKI: Likely due to the above.  Resolved -Recheck in 1 week   Uncontrolled type 1 diabetes with hypoglycemia and hyperglycemia, severe gastroparesis and neuropathy: A1c 11.2% (10.2% in 09/2021).  Hypoglycemia was due to NPO and resolved. -Increase Levemir from 16 units daily to 13 units twice daily -Continue SSI-moderate -Continue home statin -Reassess glycemic control and adjust as appropriate   Iron deficiency anemia: H&H at baseline.  Anemia panel suggests some degree of iron  deficiency -Consider p.o. iron in the future.   Essential hypertension/sinus tachycardia: Improved. -Continue home Coreg   Prolonged QTc: Resolved.  QTc down to 368. -Continue home Coreg    Hyperlipidemia: -Continue home statin.   Bilateral lower extremity skin exfoliation: Reports picking on her skin.  No signs of infection. -Wound care instruction given per WOCN           Vital signs Vitals:   03/24/22 0408 03/24/22 1417 03/24/22 2057 03/25/22 0524  BP: (!) 149/97 130/86 (!) 147/103 (!) 126/96  Pulse: 99 (!) 104 (!) 103 98  Temp: 98 F (36.7 C) 98 F (36.7 C) 98.2 F (36.8 C) 98.2 F (36.8 C)  Resp: 18 20 18 16   Weight:      SpO2: 98% 100% 97% 98%  TempSrc: Oral Oral Oral Oral  BMI (Calculated):         Discharge exam  GENERAL: No apparent distress.  Nontoxic. HEENT: MMM.  Vision and hearing grossly intact.  NECK: Supple.  No apparent JVD.  RESP:  No IWOB.  Fair aeration bilaterally. CVS:  RRR. Heart sounds normal.  ABD/GI/GU: BS+. Abd soft, NTND.  MSK/EXT:  Moves extremities. No apparent deformity. No edema.  SKIN: Few scattered BLE skin lesions without signs of infection. NEURO: Awake and alert. Oriented appropriately.  No apparent focal neuro deficit. PSYCH: Calm. Normal affect.   Discharge Instructions Discharge Instructions     Call MD for:  extreme fatigue   Complete by: As directed    Call MD for:  persistant nausea and vomiting   Complete by: As directed    Call MD for:  severe uncontrolled pain   Complete by: As directed    Diet - low sodium heart healthy   Complete by: As directed    Diet Carb Modified   Complete by: As directed    Discharge instructions   Complete by: As directed    It has been a pleasure taking care of you!  You were hospitalized due to severe nausea, vomiting and abdominal pain likely due to gastroparesis from uncontrolled diabetes.  Your symptoms improved.  We are discharging you on Reglan.  We have also made adjustment to your insulin.  Please review your new medication list and the directions on your medications before you take them.  We recommend soft low fiber residue diet for gastroparesis.  Follow-up with  your primary care doctor or endocrinologist in 1 to 2 weeks or sooner if needed.   Take care,   Discharge wound care:   Complete by: As directed    Wound care  Daily      Comments: Wound care to lower extremity full thickness wounds (bilateral ankles and right pretibial area):  Cleanse with NS, pat dry. Cover with size appropriate piece of xeroform gauze # [id number]), top with dry gauze and secure with silicone foam dressings., Change xeroform daily, may reuse silicone foam. Change PRN rolling of dressing edges.   Increase activity slowly   Complete by: As directed       Allergies as of 03/25/2022       Reactions   Lisinopril Cough   Breakout in rash        Medication List     STOP taking these medications    dicyclomine 20 MG tablet Commonly known as: BENTYL       TAKE these medications    atorvastatin 20 MG tablet Commonly known as: LIPITOR Take 1 tablet (20 mg total) by mouth daily.  carvedilol 12.5 MG tablet Commonly known as: COREG Take 0.5 tablets (6.25 mg total) by mouth 2 (two) times daily with a meal.   Dexcom G6 Transmitter Misc Change every 90 days   gabapentin 300 MG capsule Commonly known as: NEURONTIN Take 1 capsule (300 mg total) by mouth 3 (three) times daily.   hydrOXYzine 25 MG capsule Commonly known as: VISTARIL Take 1 capsule (25 mg total) by mouth every 8 (eight) hours as needed. What changed: reasons to take this   insulin regular 100 units/mL injection Commonly known as: NovoLIN R Inject 0-0.15 mLs (0-15 Units total) into the skin See admin instructions. Inject 0-15 units into the skin three times a day with meals, per sliding scale   Levemir FlexTouch 100 UNIT/ML FlexPen Generic drug: insulin detemir Inject 13 Units into the skin 2 (two) times daily. What changed:  how much to take when to take this   losartan 25 MG tablet Commonly known as: COZAAR Take 1 tablet (25 mg total) by mouth daily.   metoCLOPramide 5 MG  tablet Commonly known as: Reglan Take 1 tablet (5 mg total) by mouth 4 (four) times daily -  before meals and at bedtime. What changed:  medication strength how much to take when to take this reasons to take this   ondansetron 4 MG tablet Commonly known as: ZOFRAN Take 4 mg by mouth every 8 (eight) hours as needed for nausea or vomiting.   pantoprazole 40 MG tablet Commonly known as: PROTONIX Take 1 tablet (40 mg total) by mouth 2 (two) times daily before a meal.               Discharge Care Instructions  (From admission, onward)           Start     Ordered   03/25/22 0000  Discharge wound care:       Comments: Wound care  Daily      Comments: Wound care to lower extremity full thickness wounds (bilateral ankles and right pretibial area):  Cleanse with NS, pat dry. Cover with size appropriate piece of xeroform gauze [place] # [id number]), top with dry gauze and secure with silicone foam dressings., Change xeroform daily, may reuse silicone foam. Change PRN rolling of dressing edges.   03/25/22 0724            Consultations: None  Procedures/Studies:   CT ABDOMEN PELVIS W CONTRAST  Result Date: 03/22/2022 CLINICAL DATA:  Abdominal pain, acute. Emesis. EXAM: CT ABDOMEN AND PELVIS WITH CONTRAST TECHNIQUE: Multidetector CT imaging of the abdomen and pelvis was performed using the standard protocol following bolus administration of intravenous contrast. RADIATION DOSE REDUCTION: This exam was performed according to the departmental dose-optimization program which includes automated exposure control, adjustment of the mA and/or kV according to patient size and/or use of iterative reconstruction technique. CONTRAST:  OMNIPAQUE IOHEXOL 300 MG/ML  SOLN COMPARISON:  08/24/2021 FINDINGS: Lower chest: No acute abnormality. Hepatobiliary: No focal liver abnormality is seen. No gallstones, gallbladder wall thickening, or biliary dilatation. Pancreas: Unremarkable. No pancreatic  ductal dilatation or surrounding inflammatory changes. Spleen: Normal in size without focal abnormality. Adrenals/Urinary Tract: Normal adrenal glands. No kidney stone, hydronephrosis, or mass. Urinary bladder is unremarkable. Stomach/Bowel: There is circumferential wall thickening involving the imaged portions of the distal esophagus. The stomach appears nondistended. The duodenum is normal. No small bowel wall thickening, inflammation, or distension. Similar to the past two exams the appendix is prominent measuring up to 8 mm. Gas is  noted throughout the lumen of the appendix however and there is no periappendiceal fat stranding. Mild wall thickening involving the descending colon is noted without significant pericolonic inflammatory fat strain or free fluid. There is also mild wall thickening involving the rectum. Vascular/Lymphatic: No significant vascular findings are present. No enlarged abdominal or pelvic lymph nodes. Reproductive: Uterus and bilateral adnexa are unremarkable. Other: No free fluid or fluid collections. No signs of pneumoperitoneum. Musculoskeletal: No acute or significant osseous findings. IMPRESSION: 1. There is circumferential wall thickening involving the imaged portions of the distal esophagus. Correlate for any clinical signs or symptoms of esophagitis and gastritis. 2. Mild wall thickening involving the descending colon and rectum. No signs of pericolonic fat stranding or free fluid. Findings may reflect incomplete distention or mild colitis. 3. The appendix is prominent measuring up to 8 mm. Gas is noted throughout the lumen of the appendix however and there is no periappendiceal fat stranding to suggest acute appendicitis. This likely represents a normal anatomic variant. Electronically Signed   By: Kerby Moors M.D.   On: 03/22/2022 12:20       The results of significant diagnostics from this hospitalization (including imaging, microbiology, ancillary and laboratory) are  listed below for reference.     Microbiology: No results found for this or any previous visit (from the past 240 hour(s)).   Labs:  CBC: Recent Labs  Lab 03/21/22 1605 03/22/22 0618 03/23/22 0651 03/24/22 0553 03/25/22 0543  WBC 12.3* 14.1* 9.2 7.9 9.0  NEUTROABS 10.5*  --   --   --   --   HGB 12.5 10.9* 10.0* 10.1* 9.9*  HCT 38.1 34.2* 31.5* 33.7* 31.9*  MCV 86.2 89.1 91.0 93.9 90.9  PLT 556* 479* 395 394 404*   BMP &GFR Recent Labs  Lab 03/21/22 1605 03/22/22 0618 03/23/22 0651 03/24/22 0553 03/25/22 0543  NA 136 140 138 134* 135  K 3.7 3.8 3.6 4.5 4.4  CL 100 104 103 102 102  CO2 24 24 22 23 24   GLUCOSE 264* 211* 186* 103* 151*  BUN 31* 30* 25* 14 18  CREATININE 1.35* 1.74* 1.18* 0.85 1.09*  CALCIUM 10.0 9.6 9.0 9.3 9.4  MG  --   --  1.8 1.8 2.2  PHOS  --   --  4.5 3.6 4.8*   Estimated Creatinine Clearance: 83.8 mL/min (A) (by C-G formula based on SCr of 1.09 mg/dL (H)). Liver & Pancreas: Recent Labs  Lab 03/21/22 1605 03/23/22 0651 03/24/22 0553 03/25/22 0543  AST 27 19 21   --   ALT 21 15 15   --   ALKPHOS 113 70 66  --   BILITOT 0.9 0.9 0.7  --   PROT 8.8* 6.0* 6.0*  --   ALBUMIN 3.6 2.5* 2.6* 2.9*   Recent Labs  Lab 03/21/22 1605  LIPASE 30   No results for input(s): "AMMONIA" in the last 168 hours. Diabetic: Recent Labs    03/23/22 0651  HGBA1C 11.2*   Recent Labs  Lab 03/24/22 0726 03/24/22 1110 03/24/22 1556 03/24/22 2059 03/25/22 0740  GLUCAP 84 108* 115* 197* 149*   Cardiac Enzymes: No results for input(s): "CKTOTAL", "CKMB", "CKMBINDEX", "TROPONINI" in the last 168 hours. No results for input(s): "PROBNP" in the last 8760 hours. Coagulation Profile: No results for input(s): "INR", "PROTIME" in the last 168 hours. Thyroid Function Tests: No results for input(s): "TSH", "T4TOTAL", "FREET4", "T3FREE", "THYROIDAB" in the last 72 hours. Lipid Profile: No results for input(s): "CHOL", "HDL", "LDLCALC", "TRIG", "CHOLHDL",  "  LDLDIRECT" in the last 72 hours. Anemia Panel: Recent Labs    03/24/22 0553  VITAMINB12 771  FOLATE 12.0  FERRITIN 18  TIBC 417  IRON 78  RETICCTPCT 2.3   Urine analysis:    Component Value Date/Time   COLORURINE YELLOW 03/21/2022 0944   APPEARANCEUR CLOUDY (A) 03/21/2022 0944   LABSPEC 1.018 03/21/2022 0944   PHURINE 5.0 03/21/2022 0944   GLUCOSEU >=500 (A) 03/21/2022 0944   GLUCOSEU NEGATIVE 05/22/2020 1417   HGBUR SMALL (A) 03/21/2022 0944   BILIRUBINUR NEGATIVE 03/21/2022 0944   KETONESUR 5 (A) 03/21/2022 0944   PROTEINUR >=300 (A) 03/21/2022 0944   UROBILINOGEN 0.2 05/22/2020 1417   NITRITE NEGATIVE 03/21/2022 0944   LEUKOCYTESUR TRACE (A) 03/21/2022 0944   Sepsis Labs: Invalid input(s): "PROCALCITONIN", "LACTICIDVEN"   SIGNED:  Mercy Riding, MD  [facility] 03/25/2022, 11:24 AM

## 2022-03-26 ENCOUNTER — Telehealth: Payer: Self-pay

## 2022-03-26 NOTE — Telephone Encounter (Signed)
Transition Care Management Unsuccessful Follow-up Telephone Call  Date of discharge and from where:  03/25/22 [facility]. Dx: Intractable Vomitting  Attempts:  1st Attempt  Reason for unsuccessful TCM follow-up call:  Left voice message

## 2022-03-28 NOTE — Telephone Encounter (Signed)
Pt dismissed from the practice on 03/03/22. No further attempts to contact will  be made.

## 2022-04-05 ENCOUNTER — Emergency Department (HOSPITAL_COMMUNITY): Payer: Medicaid Other

## 2022-04-05 ENCOUNTER — Encounter (HOSPITAL_COMMUNITY): Payer: Self-pay

## 2022-04-05 ENCOUNTER — Inpatient Hospital Stay (HOSPITAL_COMMUNITY)
Admission: EM | Admit: 2022-04-05 | Discharge: 2022-04-09 | DRG: 073 | Disposition: A | Discharge status: Home / Self Care | Payer: Medicaid Other | Attending: Internal Medicine | Admitting: Internal Medicine

## 2022-04-05 DIAGNOSIS — K3184 Gastroparesis: Secondary | ICD-10-CM | POA: Diagnosis present

## 2022-04-05 DIAGNOSIS — Z800 Family history of malignant neoplasm of digestive organs: Secondary | ICD-10-CM

## 2022-04-05 DIAGNOSIS — Z794 Long term (current) use of insulin: Secondary | ICD-10-CM

## 2022-04-05 DIAGNOSIS — D500 Iron deficiency anemia secondary to blood loss (chronic): Secondary | ICD-10-CM | POA: Diagnosis present

## 2022-04-05 DIAGNOSIS — Z8041 Family history of malignant neoplasm of ovary: Secondary | ICD-10-CM

## 2022-04-05 DIAGNOSIS — E109 Type 1 diabetes mellitus without complications: Secondary | ICD-10-CM | POA: Diagnosis present

## 2022-04-05 DIAGNOSIS — E1065 Type 1 diabetes mellitus with hyperglycemia: Secondary | ICD-10-CM | POA: Diagnosis present

## 2022-04-05 DIAGNOSIS — E1022 Type 1 diabetes mellitus with diabetic chronic kidney disease: Secondary | ICD-10-CM | POA: Diagnosis present

## 2022-04-05 DIAGNOSIS — N3001 Acute cystitis with hematuria: Secondary | ICD-10-CM | POA: Diagnosis present

## 2022-04-05 DIAGNOSIS — Z79899 Other long term (current) drug therapy: Secondary | ICD-10-CM

## 2022-04-05 DIAGNOSIS — Z89422 Acquired absence of other left toe(s): Secondary | ICD-10-CM

## 2022-04-05 DIAGNOSIS — N3000 Acute cystitis without hematuria: Secondary | ICD-10-CM

## 2022-04-05 DIAGNOSIS — E1043 Type 1 diabetes mellitus with diabetic autonomic (poly)neuropathy: Secondary | ICD-10-CM | POA: Diagnosis present

## 2022-04-05 DIAGNOSIS — E1159 Type 2 diabetes mellitus with other circulatory complications: Secondary | ICD-10-CM | POA: Diagnosis present

## 2022-04-05 DIAGNOSIS — E8809 Other disorders of plasma-protein metabolism, not elsewhere classified: Secondary | ICD-10-CM | POA: Diagnosis not present

## 2022-04-05 DIAGNOSIS — K226 Gastro-esophageal laceration-hemorrhage syndrome: Secondary | ICD-10-CM | POA: Diagnosis present

## 2022-04-05 DIAGNOSIS — Z833 Family history of diabetes mellitus: Secondary | ICD-10-CM

## 2022-04-05 DIAGNOSIS — E1069 Type 1 diabetes mellitus with other specified complication: Secondary | ICD-10-CM | POA: Diagnosis present

## 2022-04-05 DIAGNOSIS — E1143 Type 2 diabetes mellitus with diabetic autonomic (poly)neuropathy: Principal | ICD-10-CM | POA: Diagnosis present

## 2022-04-05 DIAGNOSIS — Z801 Family history of malignant neoplasm of trachea, bronchus and lung: Secondary | ICD-10-CM

## 2022-04-05 DIAGNOSIS — N179 Acute kidney failure, unspecified: Secondary | ICD-10-CM | POA: Diagnosis not present

## 2022-04-05 DIAGNOSIS — K219 Gastro-esophageal reflux disease without esophagitis: Secondary | ICD-10-CM | POA: Diagnosis present

## 2022-04-05 DIAGNOSIS — I10 Essential (primary) hypertension: Secondary | ICD-10-CM | POA: Diagnosis present

## 2022-04-05 DIAGNOSIS — Z1152 Encounter for screening for COVID-19: Secondary | ICD-10-CM

## 2022-04-05 DIAGNOSIS — R9431 Abnormal electrocardiogram [ECG] [EKG]: Secondary | ICD-10-CM | POA: Diagnosis present

## 2022-04-05 DIAGNOSIS — E039 Hypothyroidism, unspecified: Secondary | ICD-10-CM | POA: Diagnosis present

## 2022-04-05 DIAGNOSIS — K920 Hematemesis: Secondary | ICD-10-CM | POA: Diagnosis not present

## 2022-04-05 DIAGNOSIS — I959 Hypotension, unspecified: Secondary | ICD-10-CM | POA: Diagnosis present

## 2022-04-05 DIAGNOSIS — N182 Chronic kidney disease, stage 2 (mild): Secondary | ICD-10-CM | POA: Diagnosis present

## 2022-04-05 DIAGNOSIS — E1010 Type 1 diabetes mellitus with ketoacidosis without coma: Secondary | ICD-10-CM

## 2022-04-05 DIAGNOSIS — N390 Urinary tract infection, site not specified: Secondary | ICD-10-CM | POA: Diagnosis present

## 2022-04-05 DIAGNOSIS — E860 Dehydration: Secondary | ICD-10-CM | POA: Diagnosis present

## 2022-04-05 DIAGNOSIS — I129 Hypertensive chronic kidney disease with stage 1 through stage 4 chronic kidney disease, or unspecified chronic kidney disease: Secondary | ICD-10-CM | POA: Diagnosis present

## 2022-04-05 DIAGNOSIS — Z803 Family history of malignant neoplasm of breast: Secondary | ICD-10-CM

## 2022-04-05 DIAGNOSIS — E785 Hyperlipidemia, unspecified: Secondary | ICD-10-CM | POA: Diagnosis present

## 2022-04-05 DIAGNOSIS — R112 Nausea with vomiting, unspecified: Secondary | ICD-10-CM | POA: Diagnosis not present

## 2022-04-05 DIAGNOSIS — I152 Hypertension secondary to endocrine disorders: Secondary | ICD-10-CM | POA: Diagnosis present

## 2022-04-05 LAB — URINALYSIS, ROUTINE W REFLEX MICROSCOPIC
Bilirubin Urine: NEGATIVE
Glucose, UA: >=500 mg/dL — AB
Ketones, ur: NEGATIVE mg/dL
Leukocytes,Ua: NEGATIVE
Nitrite: NEGATIVE
Protein, ur: >=300 mg/dL — AB
Specific Gravity, Urine: 1.032 — ABNORMAL HIGH (ref 1.005–1.030)
pH: 7 (ref 5.0–8.0)

## 2022-04-05 LAB — CBG MONITORING, ED
Glucose-Capillary: 344 mg/dL — ABNORMAL HIGH (ref 70–99)
Glucose-Capillary: 196 mg/dL — ABNORMAL HIGH (ref 70–99)
Glucose-Capillary: 229 mg/dL — ABNORMAL HIGH (ref 70–99)

## 2022-04-05 LAB — CBC WITH DIFFERENTIAL/PLATELET
Abs Immature Granulocytes: 0.03 10*3/uL (ref 0.00–0.07)
Basophils Absolute: 0.1 10*3/uL (ref 0.0–0.1)
Basophils Relative: 1 %
Eosinophils Absolute: 0.1 10*3/uL (ref 0.0–0.5)
Eosinophils Relative: 1 %
HCT: 38.7 % (ref 36.0–46.0)
Hemoglobin: 12.6 g/dL (ref 12.0–15.0)
Immature Granulocytes: 0 %
Lymphocytes Relative: 19 %
Lymphs Abs: 2 10*3/uL (ref 0.7–4.0)
MCH: 28.6 pg (ref 26.0–34.0)
MCHC: 32.6 g/dL (ref 30.0–36.0)
MCV: 87.8 fL (ref 80.0–100.0)
Monocytes Absolute: 0.5 10*3/uL (ref 0.1–1.0)
Monocytes Relative: 4 %
Neutro Abs: 8.2 10*3/uL — ABNORMAL HIGH (ref 1.7–7.7)
Neutrophils Relative %: 75 %
Platelets: 474 10*3/uL — ABNORMAL HIGH (ref 150–400)
RBC: 4.41 MIL/uL (ref 3.87–5.11)
RDW: 13.8 % (ref 11.5–15.5)
WBC: 10.8 10*3/uL — ABNORMAL HIGH (ref 4.0–10.5)
nRBC: 0 % (ref 0.0–0.2)

## 2022-04-05 LAB — COMPREHENSIVE METABOLIC PANEL
ALT: 48 U/L — ABNORMAL HIGH (ref 0–44)
AST: 33 U/L (ref 15–41)
Albumin: 3.7 g/dL (ref 3.5–5.0)
Alkaline Phosphatase: 111 U/L (ref 38–126)
Anion gap: 11 (ref 5–15)
BUN: 27 mg/dL — ABNORMAL HIGH (ref 6–20)
CO2: 23 mmol/L (ref 22–32)
Calcium: 10.2 mg/dL (ref 8.9–10.3)
Chloride: 103 mmol/L (ref 98–111)
Creatinine, Ser: 1.37 mg/dL — ABNORMAL HIGH (ref 0.44–1.00)
GFR, Estimated: 53 mL/min — ABNORMAL LOW (ref >60)
Glucose, Bld: 255 mg/dL — ABNORMAL HIGH (ref 70–99)
Potassium: 3.5 mmol/L (ref 3.5–5.1)
Sodium: 137 mmol/L (ref 135–145)
Total Bilirubin: 0.7 mg/dL (ref 0.3–1.2)
Total Protein: 8.3 g/dL — ABNORMAL HIGH (ref 6.5–8.1)

## 2022-04-05 LAB — RESP PANEL BY RT-PCR (RSV, FLU A&B, COVID)  RVPGX2
Influenza A by PCR: NEGATIVE
Influenza B by PCR: NEGATIVE
Resp Syncytial Virus by PCR: NEGATIVE
SARS Coronavirus 2 by RT PCR: NEGATIVE

## 2022-04-05 LAB — LIPASE, BLOOD: Lipase: 33 U/L (ref 11–51)

## 2022-04-05 LAB — TROPONIN I (HIGH SENSITIVITY)
Troponin I (High Sensitivity): 3 ng/L (ref <18)
Troponin I (High Sensitivity): 3 ng/L (ref <18)

## 2022-04-05 LAB — I-STAT BETA HCG BLOOD, ED (MC, WL, AP ONLY): I-stat hCG, quantitative: <5 m[IU]/mL (ref <5)

## 2022-04-05 LAB — D-DIMER, QUANTITATIVE: D-Dimer, Quant: 1.11 ug/mL-FEU — ABNORMAL HIGH (ref 0.00–0.50)

## 2022-04-05 LAB — BETA-HYDROXYBUTYRIC ACID: Beta-Hydroxybutyric Acid: 0.13 mmol/L (ref 0.05–0.27)

## 2022-04-05 MED ORDER — LACTATED RINGERS IV BOLUS
2000.0000 mL | Freq: Once | INTRAVENOUS | Status: AC
  Administered 2022-04-05: 2000 mL via INTRAVENOUS

## 2022-04-05 MED ORDER — INSULIN ASPART 100 UNIT/ML IJ SOLN
0.0000 [IU] | INTRAMUSCULAR | Status: DC
  Administered 2022-04-05 – 2022-04-06 (×2): 3 [IU] via SUBCUTANEOUS
  Administered 2022-04-06 (×2): 5 [IU] via SUBCUTANEOUS
  Administered 2022-04-06: 7 [IU] via SUBCUTANEOUS
  Administered 2022-04-06: 2 [IU] via SUBCUTANEOUS
  Administered 2022-04-07 (×4): 3 [IU] via SUBCUTANEOUS
  Administered 2022-04-08: 2 [IU] via SUBCUTANEOUS
  Administered 2022-04-08: 3 [IU] via SUBCUTANEOUS
  Administered 2022-04-08: 5 [IU] via SUBCUTANEOUS
  Administered 2022-04-08: 1 [IU] via SUBCUTANEOUS
  Administered 2022-04-08 (×2): 2 [IU] via SUBCUTANEOUS
  Administered 2022-04-09: 1 [IU] via SUBCUTANEOUS
  Administered 2022-04-09: 2 [IU] via SUBCUTANEOUS
  Administered 2022-04-09: 3 [IU] via SUBCUTANEOUS
  Administered 2022-04-09: 2 [IU] via SUBCUTANEOUS
  Filled 2022-04-05: qty 0.09

## 2022-04-05 MED ORDER — DIPHENHYDRAMINE HCL 50 MG/ML IJ SOLN
12.5000 mg | Freq: Once | INTRAMUSCULAR | Status: AC
  Administered 2022-04-05: 12.5 mg via INTRAVENOUS
  Filled 2022-04-05: qty 1

## 2022-04-05 MED ORDER — IOHEXOL 350 MG/ML SOLN
75.0000 mL | Freq: Once | INTRAVENOUS | Status: AC | PRN
  Administered 2022-04-05: 75 mL via INTRAVENOUS

## 2022-04-05 MED ORDER — FENTANYL CITRATE PF 50 MCG/ML IJ SOSY
50.0000 ug | PREFILLED_SYRINGE | Freq: Once | INTRAMUSCULAR | Status: AC
  Administered 2022-04-05: 50 ug via INTRAVENOUS
  Filled 2022-04-05: qty 1

## 2022-04-05 MED ORDER — LIDOCAINE VISCOUS HCL 2 % MT SOLN
15.0000 mL | Freq: Once | OROMUCOSAL | Status: AC
  Administered 2022-04-05: 15 mL via ORAL
  Filled 2022-04-05: qty 15

## 2022-04-05 MED ORDER — GABAPENTIN 300 MG PO CAPS
300.0000 mg | ORAL_CAPSULE | Freq: Three times a day (TID) | ORAL | Status: DC
  Administered 2022-04-05 – 2022-04-09 (×11): 300 mg via ORAL
  Filled 2022-04-05 (×11): qty 1

## 2022-04-05 MED ORDER — HYDROMORPHONE HCL 1 MG/ML IJ SOLN
1.0000 mg | Freq: Once | INTRAMUSCULAR | Status: AC
  Administered 2022-04-05: 1 mg via INTRAVENOUS
  Filled 2022-04-05: qty 1

## 2022-04-05 MED ORDER — METOCLOPRAMIDE HCL 5 MG/ML IJ SOLN
5.0000 mg | Freq: Four times a day (QID) | INTRAMUSCULAR | Status: DC | PRN
  Administered 2022-04-06 – 2022-04-08 (×3): 5 mg via INTRAVENOUS
  Filled 2022-04-05 (×3): qty 2

## 2022-04-05 MED ORDER — DROPERIDOL 2.5 MG/ML IJ SOLN
2.5000 mg | Freq: Once | INTRAMUSCULAR | Status: AC
  Administered 2022-04-05: 2.5 mg via INTRAVENOUS
  Filled 2022-04-05: qty 2

## 2022-04-05 MED ORDER — FAMOTIDINE IN NACL 20-0.9 MG/50ML-% IV SOLN
20.0000 mg | Freq: Once | INTRAVENOUS | Status: AC
  Administered 2022-04-05: 20 mg via INTRAVENOUS
  Filled 2022-04-05: qty 50

## 2022-04-05 MED ORDER — ALUM & MAG HYDROXIDE-SIMETH 200-200-20 MG/5ML PO SUSP
30.0000 mL | Freq: Once | ORAL | Status: AC
  Administered 2022-04-05: 30 mL via ORAL
  Filled 2022-04-05: qty 30

## 2022-04-05 MED ORDER — HYDROMORPHONE HCL 1 MG/ML IJ SOLN
1.0000 mg | INTRAMUSCULAR | Status: DC | PRN
  Administered 2022-04-05 – 2022-04-06 (×3): 1 mg via INTRAVENOUS
  Filled 2022-04-05 (×3): qty 1

## 2022-04-05 MED ORDER — SODIUM CHLORIDE 0.9 % IV SOLN
1.0000 g | INTRAVENOUS | Status: DC
  Administered 2022-04-06 – 2022-04-07 (×2): 1 g via INTRAVENOUS
  Filled 2022-04-05 (×2): qty 10

## 2022-04-05 MED ORDER — PANTOPRAZOLE SODIUM 40 MG IV SOLR
40.0000 mg | Freq: Two times a day (BID) | INTRAVENOUS | Status: DC
  Administered 2022-04-05 – 2022-04-09 (×8): 40 mg via INTRAVENOUS
  Filled 2022-04-05 (×8): qty 10

## 2022-04-05 MED ORDER — INSULIN DETEMIR 100 UNIT/ML FLEXPEN: 13.0000 [IU] | PEN_INJECTOR | Freq: Two times a day (BID) | SUBCUTANEOUS | Status: DC

## 2022-04-05 MED ORDER — IOHEXOL 300 MG/ML  SOLN
100.0000 mL | Freq: Once | INTRAMUSCULAR | Status: AC | PRN
  Administered 2022-04-05: 100 mL via INTRAVENOUS

## 2022-04-05 MED ORDER — INSULIN DETEMIR 100 UNIT/ML ~~LOC~~ SOLN
13.0000 [IU] | Freq: Two times a day (BID) | SUBCUTANEOUS | Status: DC
  Administered 2022-04-06 – 2022-04-09 (×8): 13 [IU] via SUBCUTANEOUS
  Filled 2022-04-05 (×9): qty 0.13

## 2022-04-05 MED ORDER — CARVEDILOL 6.25 MG PO TABS
6.2500 mg | ORAL_TABLET | Freq: Two times a day (BID) | ORAL | Status: DC
  Administered 2022-04-06 – 2022-04-09 (×7): 6.25 mg via ORAL
  Filled 2022-04-05 (×7): qty 1

## 2022-04-05 MED ORDER — ONDANSETRON HCL 4 MG/2ML IJ SOLN
4.0000 mg | Freq: Once | INTRAMUSCULAR | Status: AC
  Administered 2022-04-06: 4 mg via INTRAVENOUS
  Filled 2022-04-05: qty 2

## 2022-04-05 MED ORDER — SODIUM CHLORIDE 0.9 % IV SOLN
1.0000 g | Freq: Once | INTRAVENOUS | Status: AC
  Administered 2022-04-05: 1 g via INTRAVENOUS
  Filled 2022-04-05: qty 10

## 2022-04-05 MED ORDER — METOCLOPRAMIDE HCL 5 MG/ML IJ SOLN
10.0000 mg | Freq: Once | INTRAMUSCULAR | Status: AC
  Administered 2022-04-05: 10 mg via INTRAVENOUS
  Filled 2022-04-05: qty 2

## 2022-04-05 MED ORDER — ATORVASTATIN CALCIUM 20 MG PO TABS
20.0000 mg | ORAL_TABLET | Freq: Every day | ORAL | Status: DC
  Administered 2022-04-05 – 2022-04-09 (×5): 20 mg via ORAL
  Filled 2022-04-05 (×5): qty 1

## 2022-04-05 NOTE — Assessment & Plan Note (Addendum)
Continue Rocephin await results of urine culture.  CT scan showed possible gas in the bladder which is new from prior patient denies bladder catheterization Discussed with urology who felt that this is most likely secondary to gas forming bacteria secondary to UTI and no indication to need further investigation for fistula at this time.  Continue to monitor

## 2022-04-05 NOTE — Assessment & Plan Note (Signed)
Restart Coreg at 6.25 mg with meals

## 2022-04-05 NOTE — ED Provider Notes (Signed)
[place] EMERGENCY DEPARTMENT AT [facility] Provider Note   CSN: [id number] Arrival date & time: 04/05/22  1214     History  Chief Complaint  Patient presents with   Emesis    Barbe Thorup is a 32 y.o. female.   Emesis Associated symptoms: abdominal pain      32 year old female with medical history significant for HTN, diabetes mellitus, diabetic gastroparesis, hypothyroidism, H. pylori associated gastric ulcers, GERD, gastroparesis, DKA, prolonged QT on EKG who presents to the emergency department with intractable nausea and vomiting with generalized abdominal discomfort that came on today.  The patient states that she felt fine yesterday.  This morning she developed sudden onset emesis around 9 AM.  She has a history of hospitalizations for DKA and gastroparesis.  She endorses diffuse abdominal pain.  She denies any hematemesis, melena or hematochezia.  She denies any genitourinary symptoms.    Home Medications Prior to Admission medications   Medication Sig Start Date End Date Taking? Authorizing Provider  atorvastatin (LIPITOR) 20 MG tablet Take 1 tablet (20 mg total) by mouth daily. 10/29/21   [name], MD  carvedilol (COREG) 12.5 MG tablet Take 0.5 tablets (6.25 mg total) by mouth 2 (two) times daily with a meal. 03/06/22 04/05/22  Shelly Coss, MD  Continuous Blood Gluc Transmit (DEXCOM G6 TRANSMITTER) MISC Change every 90 days 11/29/21   Shamleffer, Melanie Crazier, MD  gabapentin (NEURONTIN) 300 MG capsule Take 1 capsule (300 mg total) by mouth 3 (three) times daily. 12/30/21 03/30/22  Barb Merino, MD  hydrOXYzine (VISTARIL) 25 MG capsule Take 1 capsule (25 mg total) by mouth every 8 (eight) hours as needed. Patient taking differently: Take 25 mg by mouth every 8 (eight) hours as needed for anxiety. 07/31/21   Georgette Shell, MD  insulin detemir (LEVEMIR FLEXTOUCH) 100 UNIT/ML FlexPen Inject 13 Units into the skin 2 (two) times daily.  03/25/22   Mercy Riding, MD  insulin regular (NOVOLIN R) 100 units/mL injection Inject 0-0.15 mLs (0-15 Units total) into the skin See admin instructions. Inject 0-15 units into the skin three times a day with meals, per sliding scale 03/06/22   Shelly Coss, MD  losartan (COZAAR) 25 MG tablet Take 1 tablet (25 mg total) by mouth daily. 03/06/22   Shelly Coss, MD  metoCLOPramide (REGLAN) 5 MG tablet Take 1 tablet (5 mg total) by mouth 4 (four) times daily -  before meals and at bedtime. 03/25/22 09/21/22  Mercy Riding, MD  ondansetron (ZOFRAN) 4 MG tablet Take 4 mg by mouth every 8 (eight) hours as needed for nausea or vomiting.    [provider]  pantoprazole (PROTONIX) 40 MG tablet Take 1 tablet (40 mg total) by mouth 2 (two) times daily before a meal. 11/08/21   Hosie Poisson, MD  insulin aspart (NOVOLOG) 100 UNIT/ML FlexPen Inject 5 Units into the skin 3 (three) times daily with meals. 02/23/18 07/05/19  Donne Hazel, MD      Allergies    Lisinopril    Review of Systems   Review of Systems  Gastrointestinal:  Positive for abdominal pain, nausea and vomiting.  All other systems reviewed and are negative.   Physical Exam Updated Vital Signs BP (!) 180/120   Pulse (!) 121   Temp 99.5 F (37.5 C) (Rectal)   Resp 13   SpO2 100%  Physical Exam Vitals and nursing note reviewed.  Constitutional:      General: She is in acute distress.  Appearance: She is well-developed.     Comments: Actively retching in acute distress  HENT:     Head: Normocephalic and atraumatic.  Eyes:     Conjunctiva/sclera: Conjunctivae normal.  Cardiovascular:     Rate and Rhythm: Normal rate and regular rhythm.  Pulmonary:     Effort: Pulmonary effort is normal. No respiratory distress.     Breath sounds: Normal breath sounds.  Abdominal:     Palpations: Abdomen is soft.     Tenderness: There is abdominal tenderness.     Comments: Right lower quadrant tenderness to palpation, mild guarding   Musculoskeletal:        General: No swelling.     Cervical back: Neck supple.  Skin:    General: Skin is warm and dry.     Capillary Refill: Capillary refill takes less than 2 seconds.  Neurological:     Mental Status: She is alert.  Psychiatric:        Mood and Affect: Mood normal.     ED Results / Procedures / Treatments   Labs (all labs ordered are listed, but only abnormal results are displayed) Labs Reviewed  COMPREHENSIVE METABOLIC PANEL - Abnormal; Notable for the following components:      Result Value   Glucose, Bld 255 (*)    BUN 27 (*)    Creatinine, Ser 1.37 (*)    Total Protein 8.3 (*)    ALT 48 (*)    GFR, Estimated 53 (*)    All other components within normal limits  CBC WITH DIFFERENTIAL/PLATELET - Abnormal; Notable for the following components:   WBC 10.8 (*)    Platelets 474 (*)    Neutro Abs 8.2 (*)    All other components within normal limits  URINALYSIS, ROUTINE W REFLEX MICROSCOPIC - Abnormal; Notable for the following components:   Specific Gravity, Urine 1.032 (*)    Glucose, UA >=500 (*)    Hgb urine dipstick SMALL (*)    Protein, ur >=300 (*)    Bacteria, UA RARE (*)    All other components within normal limits  D-DIMER, QUANTITATIVE - Abnormal; Notable for the following components:   D-Dimer, Quant 1.11 (*)    All other components within normal limits  CBG MONITORING, ED - Abnormal; Notable for the following components:   Glucose-Capillary 344 (*)    All other components within normal limits  CBG MONITORING, ED - Abnormal; Notable for the following components:   Glucose-Capillary 196 (*)    All other components within normal limits  RESP PANEL BY RT-PCR (RSV, FLU A&B, COVID)  RVPGX2  BETA-HYDROXYBUTYRIC ACID  LIPASE, BLOOD  I-STAT BETA HCG BLOOD, ED ([facility], [facility], [facility] ONLY)  TROPONIN I (HIGH SENSITIVITY)  TROPONIN I (HIGH SENSITIVITY)    EKG EKG Interpretation  Date/Time:  Saturday April 05 2022 13:06:19 EST Ventricular Rate:   117 PR Interval:  199 QRS Duration: 79 QT Interval:  284 QTC Calculation: 397 R Axis:   68 Text Interpretation: Sinus tachycardia Borderline prolonged PR interval Biatrial enlargement Abnormal Q suggests anterior infarct Borderline T abnormalities, diffuse leads Confirmed by Regan Lemming ([id number]) on 04/05/2022 1:16:07 PM  Radiology CT Angio Chest PE W and/or Wo Contrast  Result Date: 04/05/2022 CLINICAL DATA:  Positive D-dimer EXAM: CT ANGIOGRAPHY CHEST WITH CONTRAST TECHNIQUE: Multidetector CT imaging of the chest was performed using the standard protocol during bolus administration of intravenous contrast. Multiplanar CT image reconstructions and MIPs were obtained to evaluate the vascular anatomy. RADIATION DOSE REDUCTION:  This exam was performed according to the departmental dose-optimization program which includes automated exposure control, adjustment of the mA and/or kV according to patient size and/or use of iterative reconstruction technique. CONTRAST:  71m OMNIPAQUE IOHEXOL 350 MG/ML SOLN COMPARISON:  Chest x-ray from earlier in the same day. FINDINGS: Cardiovascular: Thoracic aorta shows a normal branching pattern. No aneurysmal dilatation or dissection is seen. No cardiac enlargement is noted. The pulmonary artery shows a normal branching pattern bilaterally. No filling defect to suggest pulmonary embolism is noted. No coronary calcifications are seen. Mediastinum/Nodes: Thoracic inlet is within normal limits. No hilar or mediastinal adenopathy is noted. The esophagus is distended with air which may be related to reflux. Lungs/Pleura: Lungs are well aerated bilaterally. No focal infiltrate or sizable effusion is seen. No parenchymal nodules are seen. No pneumothorax is noted. Upper Abdomen: As described on earlier CT of the abdomen and pelvis. Musculoskeletal: No acute rib abnormality is noted. No compression deformity is seen. Review of the MIP images confirms the above findings. IMPRESSION:  No evidence of pulmonary emboli. No acute abnormality noted. Electronically Signed   By: MInez CatalinaM.D.   On: 04/05/2022 17:04   CT ABDOMEN PELVIS W CONTRAST  Result Date: 04/05/2022 CLINICAL DATA:  RLQ abdominal pain EXAM: CT ABDOMEN AND PELVIS WITH CONTRAST TECHNIQUE: Multidetector CT imaging of the abdomen and pelvis was performed using the standard protocol following bolus administration of intravenous contrast. RADIATION DOSE REDUCTION: This exam was performed according to the departmental dose-optimization program which includes automated exposure control, adjustment of the mA and/or kV according to patient size and/or use of iterative reconstruction technique. CONTRAST:  1074mOMNIPAQUE IOHEXOL 300 MG/ML  SOLN COMPARISON:  CT [facility], 03/22/2022 and 08/24/2021. FINDINGS: Lower chest: No acute abnormality. Hepatobiliary: No focal liver abnormality is seen. No gallstones, gallbladder wall thickening, or biliary dilatation. Pancreas: No pancreatic ductal dilatation or surrounding inflammatory changes. Spleen: Normal in size without focal abnormality. Adrenals/Urinary Tract: Adrenal glands are unremarkable. Kidneys are normal, without renal calculi, focal lesion, or hydronephrosis. Small volume of dependent gas within the bladder, correlate for recent catheterization. Additional non dependent gas within the urinary bladder, with additional trace focus of gas within the Retzius space. Stomach/Bowel: Stomach is within normal limits. Appendix appears normal. No evidence of bowel wall thickening, distention, or inflammatory changes. Vascular/Lymphatic: No significant vascular findings are present. No enlarged abdominal or pelvic lymph nodes. Reproductive: Uterus and adnexa are unremarkable. Other: No abdominal wall hernia or abnormality. No abdominopelvic ascites. No intraperitoneal free air. Musculoskeletal: No acute or significant osseous findings. IMPRESSION: 1. Emphysematous cystitis. 2. Small volume dependent  gas within the urinary bladder. Correlate for catheterization Electronically Signed   By: JoMichaelle Birks.D.   On: 04/05/2022 15:04   DG Chest Portable 1 View  Result Date: 04/05/2022 CLINICAL DATA:  chest pain EXAM: PORTABLE CHEST - 1 VIEW COMPARISON:  01/03/2022 FINDINGS: Cardiac silhouette is unremarkable. No pneumothorax or pleural effusion. The lungs are clear. The visualized skeletal structures are unremarkable. IMPRESSION: No acute cardiopulmonary process. Electronically Signed   By: JoSammie Bench.D.   On: 04/05/2022 14:07    Procedures Procedures    Medications Ordered in ED Medications  HYDROmorphone (DILAUDID) injection 1 mg (has no administration in time range)  metoCLOPramide (REGLAN) injection 10 mg (has no administration in time range)  diphenhydrAMINE (BENADRYL) injection 12.5 mg (has no administration in time range)  lactated ringers bolus 2,000 mL (0 mLs Intravenous Stopped 04/05/22 1507)  droperidol (INAPSINE) 2.5 MG/ML injection  2.5 mg (2.5 mg Intravenous Given 04/05/22 1400)  fentaNYL (SUBLIMAZE) injection 50 mcg (50 mcg Intravenous Given 04/05/22 1400)  iohexol (OMNIPAQUE) 300 MG/ML solution 100 mL (100 mLs Intravenous Contrast Given 04/05/22 1443)  cefTRIAXone (ROCEPHIN) 1 g in sodium chloride 0.9 % 100 mL IVPB (0 g Intravenous Stopped 04/05/22 1638)  famotidine (PEPCID) IVPB 20 mg premix (0 mg Intravenous Stopped 04/05/22 1638)  fentaNYL (SUBLIMAZE) injection 50 mcg (50 mcg Intravenous Given 04/05/22 1620)  iohexol (OMNIPAQUE) 350 MG/ML injection 75 mL (75 mLs Intravenous Contrast Given 04/05/22 1654)  alum & mag hydroxide-simeth (MAALOX/MYLANTA) 200-200-20 MG/5ML suspension 30 mL (30 mLs Oral Given 04/05/22 1831)    And  lidocaine (XYLOCAINE) 2 % viscous mouth solution 15 mL (15 mLs Oral Given 04/05/22 1832)    ED Course/ Medical Decision Making/ A&P Clinical Course as of 04/05/22 1908  Sat Apr 05, 2022  1301 Glucose-Capillary(!): 344 [JL]  1301 Pulse Rate(!): 121  [JL]  1606 Pulse Rate(!): 127 [JL]  1708 Creatinine(!): 1.37 [JL]  1708 WBC(!): 10.8 [JL]  1708 D-Dimer, Quant(!): 1.11 [JL]    Clinical Course User Index [JL] Regan Lemming, MD                             Medical Decision Making Amount and/or Complexity of Data Reviewed Labs: ordered. Decision-making details documented in ED Course. Radiology: ordered.  Risk OTC drugs. Prescription drug management. Decision regarding hospitalization.    32 year old female with medical history significant for HTN, diabetes mellitus, diabetic gastroparesis, hypothyroidism, H. pylori associated gastric ulcers, GERD, gastroparesis, DKA, prolonged QT on EKG who presents to the emergency department with intractable nausea and vomiting with generalized abdominal discomfort that came on today.  The patient states that she felt fine yesterday.  This morning she developed sudden onset emesis around 9 AM.  She has a history of hospitalizations for DKA and gastroparesis.  She endorses diffuse abdominal pain.  She denies any hematemesis, melena or hematochezia.  She denies any genitourinary symptoms.     On arrival, the patient was afebrile, tachycardic heart rate 121, hypertensive BP 132/113 with a MAP of 121, saturating 100% on room air.  Physical exam significant for generalized abdominal tenderness to palpation with some focality to the right lower quadrant, mild guarding present.  Differential diagnose includes diabetic gastroparesis, cyclic vomiting, small bowel obstruction, cholelithiasis/cholecystitis, pancreatitis, DKA, other infectious urine abnormality, PE, viral infection such as COVID-19, influenza, RSV, appendicitis.  IV access was obtained and the patient was administered IV fentanyl, 2 L IV fluids, IV Pepcid, IV droperidol.  Laboratory evaluation significant for CBC with mild leukocytosis to 10.8, no anemia, CMP with evidence of an AKI with a serum creatinine of 1.37, hyperglycemia to 255 with no  evidence of DKA with a bicarbonate of 23 and an anion gap of 11, D-dimer was collected and elevated to 1.11, COVID-19, influenza, RSV PCR testing was collected and resulted negative, lipase was normal, beta hydroxybutyrate was normal, hCG negative, troponin normal, repeat CBG downtrending to 196, urinalysis pending.  CTA PE study: IMPRESSION:  No evidence of pulmonary emboli.    No acute abnormality noted.    CT abdomen pelvis: IMPRESSION:  1. Emphysematous cystitis.  2. Small volume dependent gas within the urinary bladder. Correlate  for catheterization    Given this finding with the patient's tachycardia and leukocytosis, IV Rocephin was administered while awaiting patient urinalysis.  Patient urinalysis resulted with many bacteria, 6-10 WBCs, negative nitrites  and leukocytes.   On repeat assessment, the patient failed a p.o. challenge.  Attempts were made to tolerate Maalox and viscous lidocaine.  The patient states that she has had some episodes of mild hematemesis.  No evidence of Boerhaave's on CT.  Suspect mild Mallory-Weiss tear in the setting of uncontrolled nausea and vomiting in the setting of diabetic gastroparesis.  Given the patient's failure to tolerate oral intake, eating AKI, vital sign abnormalities with concern for dehydration, hospitalist medicine was consulted for admission.  Final Clinical Impression(s) / ED Diagnoses Final diagnoses:  AKI (acute kidney injury) ([place])  Diabetic gastroparesis ([place])  Nausea and vomiting, unspecified vomiting type  Hematemesis with nausea    Rx / DC Orders ED Discharge Orders     None         Regan Lemming, MD 04/05/22 Einar Crow

## 2022-04-05 NOTE — Assessment & Plan Note (Signed)
Hold Cozaar given AKI

## 2022-04-05 NOTE — H&P (Addendum)
Kelli Hudson [id number] DOB: 1990/05/11 DOA: 04/05/2022     PCP: [name], MD   Outpatient Specialists:     GI Dr.  Chryl Heck) Armbruster, Carlota Raspberry, MD    Patient arrived to ER on 04/05/22 at 1214 Referred by Attending Regan Lemming, MD   Patient coming from:    home Lives  With SO    Chief Complaint:   Chief Complaint  Patient presents with   Emesis    HPI: Kelli Hudson is a 32 y.o. female with medical history significant of DM type I, HTN HLD CKD stage II gastroparesis peripheral neuropathy, GERD, prolonged QT, anemia    Presented with   persistent nausea and vomiting Patient complains of nausea vomiting history of gastroparesis with type 1 diabetes has had frequent admissions and ER visits.  Reports intractable nausea and vomiting typical for her gastroparesis. States that her to vomit at 9 AM and has been vomiting all day.  Reports abdominal pain reports small amount of streaks of red in her vomitus after she has been vomiting for quite some time. Last EGD was in 2023 by Dr. Loletha Carrow Repots burning with urination for the past few wks  No etoh no tobacco    Initial COVID TEST  NEGATIVE   Lab Results  Component Value Date   [place] 04/05/2022   [place] NEGATIVE 04/20/2021   [place] NEGATIVE 02/24/2021   [place] NEGATIVE 11/16/2020     Regarding pertinent Chronic problems:     Hyperlipidemia - on statins Lipitor (atorvastatin)  Lipid Panel     Component Value Date/Time   CHOL 224 (H) 04/17/2021 0757   TRIG 145.0 04/17/2021 0757   HDL 55.30 04/17/2021 0757   CHOLHDL 4 04/17/2021 0757   VLDL 29.0 04/17/2021 0757   LDLCALC 140 (H) 04/17/2021 0757   LDLDIRECT 83.0 07/05/2019 1343     HTN on Coreg     DM 1 -  Lab Results  Component Value Date   HGBA1C 11.2 (H) 03/23/2022   on insulin    CKD stage II - baseline Cr 1.0 CrCl cannot be calculated (Unknown ideal weight.).  Lab Results  Component Value  Date   CREATININE 1.37 (H) 04/05/2022   CREATININE 1.09 (H) 03/25/2022   CREATININE 0.85 03/24/2022     Chronic anemia - baseline hg Hemoglobin & Hematocrit  Recent Labs    03/24/22 0553 03/25/22 0543 04/05/22 1350  HGB 10.1* 9.9* 12.6    While in ER: Clinical Course as of 04/05/22 1951  Sat Apr 05, 2022  1301 Glucose-Capillary(!): 344 [JL]  1301 Pulse Rate(!): 121 [JL]  1606 Pulse Rate(!): 127 [JL]  1708 Creatinine(!): 1.37 [JL]  1708 WBC(!): 10.8 [JL]  1708 D-Dimer, Quant(!): 1.11 [JL]    Clinical Course User Index [JL] Regan Lemming, MD       CXR -  NON acute  CTabd/pelvis -  Emphysematous cystitis.  Small volume dependent gas within the urinary bladder. Correlate for catheterization  CTA chest -  non-acute, no PE,   no evidence of infiltrate  Following Medications were ordered in ER: Medications  lactated ringers bolus 2,000 mL (0 mLs Intravenous Stopped 04/05/22 1507)  droperidol (INAPSINE) 2.5 MG/ML injection 2.5 mg (2.5 mg Intravenous Given 04/05/22 1400)  fentaNYL (SUBLIMAZE) injection 50 mcg (50 mcg Intravenous Given 04/05/22 1400)  iohexol (OMNIPAQUE) 300 MG/ML solution 100 mL (100 mLs Intravenous Contrast Given 04/05/22 1443)  cefTRIAXone (ROCEPHIN) 1 g in sodium chloride 0.9 % 100 mL IVPB (0  g Intravenous Stopped 04/05/22 1638)  famotidine (PEPCID) IVPB 20 mg premix (0 mg Intravenous Stopped 04/05/22 1638)  fentaNYL (SUBLIMAZE) injection 50 mcg (50 mcg Intravenous Given 04/05/22 1620)  iohexol (OMNIPAQUE) 350 MG/ML injection 75 mL (75 mLs Intravenous Contrast Given 04/05/22 1654)  alum & mag hydroxide-simeth (MAALOX/MYLANTA) 200-200-20 MG/5ML suspension 30 mL (30 mLs Oral Given 04/05/22 1831)    And  lidocaine (XYLOCAINE) 2 % viscous mouth solution 15 mL (15 mLs Oral Given 04/05/22 1832)  HYDROmorphone (DILAUDID) injection 1 mg (1 mg Intravenous Given 04/05/22 1925)  metoCLOPramide (REGLAN) injection 10 mg (10 mg Intravenous Given 04/05/22 1927)  diphenhydrAMINE  (BENADRYL) injection 12.5 mg (12.5 mg Intravenous Given 04/05/22 1929)    ____________    ED Triage Vitals  Enc Vitals Group     BP 04/05/22 1220 (!) 132/113     Pulse Rate 04/05/22 1220 (!) 121     Resp 04/05/22 1220 18     Temp 04/05/22 1220 99 F (37.2 C)     Temp Source 04/05/22 1220 Oral     SpO2 04/05/22 1220 100 %     Weight --      Height --      Head Circumference --      Peak Flow --      Pain Score 04/05/22 1500 9     Pain Loc --      Pain Edu? --      Excl. in [place]? --   TMAX(24)@     _________________________________________ Significant initial  Findings: Abnormal Labs Reviewed  COMPREHENSIVE METABOLIC PANEL - Abnormal; Notable for the following components:      Result Value   Glucose, Bld 255 (*)    BUN 27 (*)    Creatinine, Ser 1.37 (*)    Total Protein 8.3 (*)    ALT 48 (*)    GFR, Estimated 53 (*)    All other components within normal limits  CBC WITH DIFFERENTIAL/PLATELET - Abnormal; Notable for the following components:   WBC 10.8 (*)    Platelets 474 (*)    Neutro Abs 8.2 (*)    All other components within normal limits  URINALYSIS, ROUTINE W REFLEX MICROSCOPIC - Abnormal; Notable for the following components:   Specific Gravity, Urine 1.032 (*)    Glucose, UA >=500 (*)    Hgb urine dipstick SMALL (*)    Protein, ur >=300 (*)    Bacteria, UA RARE (*)    All other components within normal limits  D-DIMER, QUANTITATIVE - Abnormal; Notable for the following components:   D-Dimer, Quant 1.11 (*)    All other components within normal limits  CBG MONITORING, ED - Abnormal; Notable for the following components:   Glucose-Capillary 344 (*)    All other components within normal limits  CBG MONITORING, ED - Abnormal; Notable for the following components:   Glucose-Capillary 196 (*)    All other components within normal limits     _________________________ Troponin 3  ECG: Ordered Personally reviewed and interpreted by me showing: HR : 117   Rhythm:Sinus tachycardia Borderline prolonged PR interval Biatrial enlargement Abnormal Q suggests anterior infarct Borderline T abnormalities, diffuse leads QTC 397   The recent clinical data is shown below. Vitals:   04/05/22 1633 04/05/22 1636 04/05/22 1738 04/05/22 1830  BP:    (!) 180/120  Pulse: (!) 116   (!) 121  Resp: 12 12  13  $ Temp:   99.5 F (37.5 C)   TempSrc:  Rectal   SpO2: 100%   100%    WBC     Component Value Date/Time   WBC 10.8 (H) 04/05/2022 1350   LYMPHSABS 2.0 04/05/2022 1350   LYMPHSABS 2.9 02/04/2021 1539   MONOABS 0.5 04/05/2022 1350   EOSABS 0.1 04/05/2022 1350   EOSABS 0.1 02/04/2021 1539   BASOSABS 0.1 04/05/2022 1350   BASOSABS 0.0 02/04/2021 1539    Lactic Acid, Venous    Component Value Date/Time   LATICACIDVEN 1.8 05/27/2020 1938      UA red and white cells in uRine rare bacteria   Urine analysis:    Component Value Date/Time   COLORURINE YELLOW 04/05/2022 [place] 04/05/2022 1704   LABSPEC 1.032 (H) 04/05/2022 1704   PHURINE 7.0 04/05/2022 1704   GLUCOSEU >=500 (A) 04/05/2022 1704   GLUCOSEU NEGATIVE 05/22/2020 1417   HGBUR SMALL (A) 04/05/2022 1704   BILIRUBINUR NEGATIVE 04/05/2022 1704   KETONESUR NEGATIVE 04/05/2022 1704   PROTEINUR >=300 (A) 04/05/2022 1704   UROBILINOGEN 0.2 05/22/2020 1417   NITRITE NEGATIVE 04/05/2022 1704   LEUKOCYTESUR NEGATIVE 04/05/2022 1704    Results for orders placed or performed during the hospital encounter of 04/05/22  Resp panel by RT-PCR (RSV, Flu A&B, Covid) Anterior Nasal Swab     Status: None   Collection Time: 04/05/22  1:50 PM   Specimen: Anterior Nasal Swab  Result Value Ref Range Status   SARS Coronavirus 2 by RT PCR NEGATIVE NEGATIVE Final         Influenza A by PCR NEGATIVE NEGATIVE Final   Influenza B by PCR NEGATIVE NEGATIVE Final         Resp Syncytial Virus by PCR NEGATIVE NEGATIVE Final           _______________________________________________ Hospitalist was called for admission for   AKI   Diabetic gastroparesis   Hematemesis with nausea     The following Work up has been ordered so far:  Orders Placed This Encounter  Procedures   Resp panel by RT-PCR (RSV, Flu A&B, Covid) Anterior Nasal Swab   CT ABDOMEN PELVIS W CONTRAST   DG Chest Portable 1 View   CT Angio Chest PE W and/or Wo Contrast   Comprehensive metabolic panel   CBC with Differential   Beta-hydroxybutyric acid   Urinalysis, Routine w reflex microscopic -Urine, Clean Catch   Lipase, blood   D-dimer, quantitative   Check Rectal Temperature   Consult to hospitalist   Airborne and Contact precautions   POC CBG, ED   I-Stat Beta hCG blood, ED ([facility], [facility], [facility] only)   POC CBG, ED   ED EKG     OTHER Significant initial  Findings:  labs showing:    Recent Labs  Lab 04/05/22 1350  NA 137  K 3.5  CO2 23  GLUCOSE 255*  BUN 27*  CREATININE 1.37*  CALCIUM 10.2    Cr  Up from baseline see below Lab Results  Component Value Date   CREATININE 1.37 (H) 04/05/2022   CREATININE 1.09 (H) 03/25/2022   CREATININE 0.85 03/24/2022    Recent Labs  Lab 04/05/22 1350  AST 33  ALT 48*  ALKPHOS 111  BILITOT 0.7  PROT 8.3*  ALBUMIN 3.7   Lab Results  Component Value Date   CALCIUM 10.2 04/05/2022   PHOS 4.8 (H) 03/25/2022    Plt: Lab Results  Component Value Date   PLT 474 (H) 04/05/2022    COVID-19 Labs  Recent Labs  04/05/22 1350  DDIMER 1.11*    Lab Results  Component Value Date   SARSCOV2NAA NEGATIVE 04/05/2022   SARSCOV2NAA NEGATIVE 04/20/2021   SARSCOV2NAA NEGATIVE 02/24/2021   [place] NEGATIVE 11/16/2020    Recent Labs  Lab 04/05/22 1350  WBC 10.8*  NEUTROABS 8.2*  HGB 12.6  HCT 38.7  MCV 87.8  PLT 474*    HG/HCT   stable,     Component Value Date/Time   HGB 12.6 04/05/2022 1350   HGB 10.1 (L) 02/04/2021 1539   HCT 38.7 04/05/2022 1350   HCT 30.0 (L) 02/04/2021  1539   MCV 87.8 04/05/2022 1350   MCV 87 02/04/2021 1539     Recent Labs  Lab 04/05/22 1350  LIPASE 33     Cardiac Panel (last 3 results) No results for input(s): "CKTOTAL", "CKMB", "TROPONINI", "RELINDX" in the last 72 hours.  .car BNP (last 3 results) No results for input(s): "BNP" in the last 8760 hours.    DM  labs:  HbA1C: Recent Labs    09/21/21 0522 10/06/21 0447 03/23/22 0651  HGBA1C 10.9* 10.3* 11.2*       CBG (last 3)  Recent Labs    04/05/22 1258 04/05/22 1512  GLUCAP 344* 196*          Cultures:    Component Value Date/Time   SDES  10/05/2021 0519    BLOOD BLOOD LEFT HAND Performed at [facility], [place] [address], [place], [place] [address]    SPECREQUEST  10/05/2021 0519    BOTTLES DRAWN AEROBIC ONLY Blood Culture adequate volume Performed at [place] [facility] [address] [place], [place] [address]    CULT (A) 10/05/2021 [id number]    DIPHTHEROIDS(CORYNEBACTERIUM SPECIES) Standardized susceptibility testing for this organism is not available. Performed at [facility], [place] [address] [place], [place] [address]    REPTSTATUS 10/08/2021 FINAL 10/05/2021 0519     Radiological Exams on Admission: CT Angio Chest PE W and/or Wo Contrast  Result Date: 04/05/2022 CLINICAL DATA:  Positive D-dimer EXAM: CT ANGIOGRAPHY CHEST WITH CONTRAST TECHNIQUE: Multidetector CT imaging of the chest was performed using the standard protocol during bolus administration of intravenous contrast. Multiplanar CT image reconstructions and MIPs were obtained to evaluate the vascular anatomy. RADIATION DOSE REDUCTION: This exam was performed according to the departmental dose-optimization program which includes automated exposure control, adjustment of the mA and/or kV according to patient size and/or use of iterative reconstruction technique. CONTRAST:  4m OMNIPAQUE IOHEXOL 350 MG/ML SOLN COMPARISON:  Chest x-ray from earlier in the  same day. FINDINGS: Cardiovascular: Thoracic aorta shows a normal branching pattern. No aneurysmal dilatation or dissection is seen. No cardiac enlargement is noted. The pulmonary artery shows a normal branching pattern bilaterally. No filling defect to suggest pulmonary embolism is noted. No coronary calcifications are seen. Mediastinum/Nodes: Thoracic inlet is within normal limits. No hilar or mediastinal adenopathy is noted. The esophagus is distended with air which may be related to reflux. Lungs/Pleura: Lungs are well aerated bilaterally. No focal infiltrate or sizable effusion is seen. No parenchymal nodules are seen. No pneumothorax is noted. Upper Abdomen: As described on earlier CT of the abdomen and pelvis. Musculoskeletal: No acute rib abnormality is noted. No compression deformity is seen. Review of the MIP images confirms the above findings. IMPRESSION: No evidence of pulmonary emboli. No acute abnormality noted. Electronically Signed   By: MInez CatalinaM.D.   On: 04/05/2022 17:04   CT ABDOMEN PELVIS W CONTRAST  Result Date: 04/05/2022 CLINICAL DATA:  RLQ abdominal pain EXAM: CT ABDOMEN AND PELVIS WITH CONTRAST TECHNIQUE: Multidetector CT imaging of the abdomen and pelvis was performed using the standard protocol following bolus administration of intravenous contrast. RADIATION DOSE REDUCTION: This exam was performed according to the departmental dose-optimization program which includes automated exposure control, adjustment of the mA and/or kV according to patient size and/or use of iterative reconstruction technique. CONTRAST:  164m OMNIPAQUE IOHEXOL 300 MG/ML  SOLN COMPARISON:  CT [facility], 03/22/2022 and 08/24/2021. FINDINGS: Lower chest: No acute abnormality. Hepatobiliary: No focal liver abnormality is seen. No gallstones, gallbladder wall thickening, or biliary dilatation. Pancreas: No pancreatic ductal dilatation or surrounding inflammatory changes. Spleen: Normal in size without focal  abnormality. Adrenals/Urinary Tract: Adrenal glands are unremarkable. Kidneys are normal, without renal calculi, focal lesion, or hydronephrosis. Small volume of dependent gas within the bladder, correlate for recent catheterization. Additional non dependent gas within the urinary bladder, with additional trace focus of gas within the Retzius space. Stomach/Bowel: Stomach is within normal limits. Appendix appears normal. No evidence of bowel wall thickening, distention, or inflammatory changes. Vascular/Lymphatic: No significant vascular findings are present. No enlarged abdominal or pelvic lymph nodes. Reproductive: Uterus and adnexa are unremarkable. Other: No abdominal wall hernia or abnormality. No abdominopelvic ascites. No intraperitoneal free air. Musculoskeletal: No acute or significant osseous findings. IMPRESSION: 1. Emphysematous cystitis. 2. Small volume dependent gas within the urinary bladder. Correlate for catheterization Electronically Signed   By: JMichaelle BirksM.D.   On: 04/05/2022 15:04   DG Chest Portable 1 View  Result Date: 04/05/2022 CLINICAL DATA:  chest pain EXAM: PORTABLE CHEST - 1 VIEW COMPARISON:  01/03/2022 FINDINGS: Cardiac silhouette is unremarkable. No pneumothorax or pleural effusion. The lungs are clear. The visualized skeletal structures are unremarkable. IMPRESSION: No acute cardiopulmonary process. Electronically Signed   By: JSammie BenchM.D.   On: 04/05/2022 14:07   _______________________________________________________________________________________________________ Latest  Blood pressure (!) 180/120, pulse (!) 121, temperature 99.5 F (37.5 C), temperature source Rectal, resp. rate 13, SpO2 100 %.   Vitals  labs and radiology finding personally reviewed  Review of Systems:    Pertinent positives include:  abdominal pain, nausea, vomiting, hematemesis Constitutional:  No weight loss, night sweats, Fevers, chills, fatigue, weight loss  HEENT:  No headaches,  Difficulty swallowing,Tooth/dental problems,Sore throat,  No sneezing, itching, ear ache, nasal congestion, post nasal drip,  Cardio-vascular:  No chest pain, Orthopnea, PND, anasarca, dizziness, palpitations.no Bilateral lower extremity swelling  GI:  No heartburn, indigestion,  diarrhea, change in bowel habits, loss of appetite, melena, blood in stool,  Resp:  no shortness of breath at rest. No dyspnea on exertion, No excess mucus, no productive cough, No non-productive cough, No coughing up of blood.No change in color of mucus.No wheezing. Skin:  no rash or lesions. No jaundice GU:  no dysuria, change in color of urine, no urgency or frequency. No straining to urinate.  No flank pain.  Musculoskeletal:  No joint pain or no joint swelling. No decreased range of motion. No back pain.  Psych:  No change in mood or affect. No depression or anxiety. No memory loss.  Neuro: no localizing neurological complaints, no tingling, no weakness, no double vision, no gait abnormality, no slurred speech, no confusion  All systems reviewed and apart from [place] are negative _______________________________________________________________________________________________ Past Medical History:   Past Medical History:  Diagnosis Date   Acute H. pylori gastric ulcer    Coffee ground emesis  Diabetes mellitus ([place])    Diabetic gastroparesis ([place])    DKA (diabetic ketoacidosis) ([place]) 02/24/2021   Gastroparesis    GERD (gastroesophageal reflux disease)    Hypertension    Hyperthyroidism    Intractable nausea and vomiting 04/20/2021   Normocytic anemia 04/20/2020   Prolonged Q-T interval on ECG      Past Surgical History:  Procedure Laterality Date   AMPUTATION TOE Left 03/10/2018   Procedure: AMPUTATION FIFTH TOE;  Surgeon: Trula Slade, DPM;  Location: [facility];  Service: Podiatry;  Laterality: Left;   BIOPSY  01/28/2020   Procedure: BIOPSY;  Surgeon: Otis Brace, MD;  [facility];  Service: Gastroenterology;;   BOTOX INJECTION  08/26/2021   Procedure: BOTOX INJECTION;  Surgeon: Doran Stabler, MD;  Location: [facility];  Service: Gastroenterology;;   ESOPHAGOGASTRODUODENOSCOPY N/A 01/28/2020   Procedure: ESOPHAGOGASTRODUODENOSCOPY (EGD);  Surgeon: Otis Brace, MD;  Location: [facility];  Service: Gastroenterology;  Laterality: N/A;   ESOPHAGOGASTRODUODENOSCOPY (EGD) WITH PROPOFOL Left 09/08/2015   Procedure: ESOPHAGOGASTRODUODENOSCOPY (EGD) WITH PROPOFOL;  Surgeon: Arta Silence, MD;  Location[facility]COPY;  Service: Endoscopy;  Laterality: Left;   ESOPHAGOGASTRODUODENOSCOPY (EGD) WITH PROPOFOL N/A 08/26/2021   Procedure: ESOPHAGOGASTRODUODENOSCOPY (EGD) WITH PROPOFOL;  Surgeon: Doran Stabler, MD;  [facility];  Service: Gastroenterology;  Laterality: N/A;   WISDOM TOOTH EXTRACTION      Social History:  Ambulatory   independently       reports that she has never smoked. She has never used smokeless tobacco. She reports current alcohol use of about 1.0 standard drink of alcohol per week. She reports that she does not use drugs.   Family History:  Family History  Problem Relation Age of Onset   Lung cancer Mother    Diabetes Mother    Bipolar disorder Father    Liver cancer Maternal Grandfather    Diabetes Maternal Aunt        x 2   Pancreatic cancer Maternal Uncle    Prostate cancer Maternal Uncle    Breast cancer Other        maternal great aunt   Heart disease Other    Ovarian cancer Other        maternal great aunt   Kidney disease Other        maternal great aunt   Colon cancer Neg Hx    Stomach cancer Neg Hx    ______________________________________________________________________________________________ Allergies: Allergies  Allergen Reactions   Lisinopril Cough    Breakout in rash     Prior to Admission medications   Medication Sig Start Date End Date Taking? Authorizing  Provider  atorvastatin (LIPITOR) 20 MG tablet Take 1 tablet (20 mg total) by mouth daily. 10/29/21   [name], MD  carvedilol (COREG) 12.5 MG tablet Take 0.5 tablets (6.25 mg total) by mouth 2 (two) times daily with a meal. 03/06/22 04/05/22  Shelly Coss, MD  Continuous Blood Gluc Transmit (DEXCOM G6 TRANSMITTER) MISC Change every 90 days 11/29/21   Shamleffer, Melanie Crazier, MD  gabapentin (NEURONTIN) 300 MG capsule Take 1 capsule (300 mg total) by mouth 3 (three) times daily. 12/30/21 03/30/22  Barb Merino, MD  hydrOXYzine (VISTARIL) 25 MG capsule Take 1 capsule (25 mg total) by mouth every 8 (eight) hours as needed. Patient taking differently: Take 25 mg by mouth every 8 (eight) hours as needed for anxiety. 07/31/21   Georgette Shell, MD  insulin detemir (LEVEMIR FLEXTOUCH) 100 UNIT/ML FlexPen  Inject 13 Units into the skin 2 (two) times daily. 03/25/22   Mercy Riding, MD  insulin regular (NOVOLIN R) 100 units/mL injection Inject 0-0.15 mLs (0-15 Units total) into the skin See admin instructions. Inject 0-15 units into the skin three times a day with meals, per sliding scale 03/06/22   Shelly Coss, MD  losartan (COZAAR) 25 MG tablet Take 1 tablet (25 mg total) by mouth daily. 03/06/22   Shelly Coss, MD  metoCLOPramide (REGLAN) 5 MG tablet Take 1 tablet (5 mg total) by mouth 4 (four) times daily -  before meals and at bedtime. 03/25/22 09/21/22  Mercy Riding, MD  ondansetron (ZOFRAN) 4 MG tablet Take 4 mg by mouth every 8 (eight) hours as needed for nausea or vomiting.    [provider]  pantoprazole (PROTONIX) 40 MG tablet Take 1 tablet (40 mg total) by mouth 2 (two) times daily before a meal. 11/08/21   Hosie Poisson, MD  insulin aspart (NOVOLOG) 100 UNIT/ML FlexPen Inject 5 Units into the skin 3 (three) times daily with meals. 02/23/18 07/05/19  Donne Hazel, MD     ___________________________________________________________________________________________________ Physical Exam:    04/05/2022    6:30 PM 04/05/2022    4:33 PM 04/05/2022    4:30 PM  Vitals with BMI  Systolic 99991111  [id number]  Diastolic 123456  0000000  Pulse [id number] 116 116    1. General:  in No  Acute distress   Chronically ill   -appearing 2. Psychological: Alert and   Oriented 3. Head/ENT:    Dry Mucous Membranes                          Head Non traumatic, neck supple                           Poor Dentition 4. SKIN:  decreased Skin turgor,  Skin clean Dry and intact no rash 5. Heart: Regular rate and rhythm no  Murmur, no Rub or gallop 6. Lungs:   no wheezes or crackles   7. Abdomen: Soft, diffusely tender, Non distended   obese  bowel sounds present 8. Lower extremities: no clubbing, cyanosis, no  edema 9. Neurologically Grossly intact, moving all 4 extremities equally   10. MSK: Normal range of motion    Chart has been reviewed  _____________________________________________________________________________________________   Assessment/Plan  32 y.o. female with medical history significant of DM type I, HTN HLD CKD stage II gastroparesis peripheral neuropathy, GERD, prolonged QT, anemia Admitted for   AKI   Diabetic gastroparesis  Nausea and vomiting, unspecified vomiting type    Hematemesis with nausea     Present on Admission:  Gastroparesis  DM type 1 (diabetes mellitus, type 1) ([place])  Hypertension associated with diabetes ([place])  Essential hypertension  Diabetic gastroparesis ([place])  UTI (urinary tract infection)     DM type 1 (diabetes mellitus, type 1) ([place]) Order sliding scale Continue home dose of Levemir 13 units twice daily  Hypertension associated with diabetes ([place]) Restart Coreg at 6.25 mg with meals  Essential hypertension Hold Cozaar given AKI  Diabetic gastroparesis ([place]) Supportive management Start Reglan Patient is requesting narcotics currently  unable to tolerate p.o. once able to tolerate p.o. would try to avoid narcotics especially IV narcotics as this is likely contributed to have gastroparesis  UTI (urinary tract infection) Continue Rocephin await results of urine culture.  CT scan showed possible  gas in the bladder which is new from prior patient denies bladder catheterization Discussed with urology who felt that this is most likely secondary to gas forming bacteria secondary to UTI and no indication to need further investigation for fistula at this time.  Continue to monitor  Hematemesis Very mild couple of little streaks in the vomit.  After has been vomiting all day.  Likely secondary to Mallory-Weiss tear versus irritation.  Protonix twice daily.  Hemoglobin stable.  If persists or becomes significant could benefit from GI consult for right now observe and monitor    Other plan as per orders.  DVT prophylaxis:  SCD      Code Status:    Code Status: Prior FULL CODE  as per patient   I had personally discussed CODE STATUS with patient     Family Communication:   Family not at  Bedside    Disposition Plan:      To home once workup is complete and patient is stable   Following barriers for discharge:                            Electrolytes corrected                                Would benefit from PT/OT eval prior to DC  Ordered                     Consults called: none   Admission status:  ED Disposition     ED Disposition  Admit   Condition  --   [name]: [place] [[id number]]  Level of Care: Progressive [102]  Admit to Progressive based on following criteria: CARDIOVASCULAR & THORACIC of moderate stability with acute coronary syndrome symptoms/low risk myocardial infarction/hypertensive urgency/arrhythmias/heart failure potentially compromising stability and stable post cardiovascular intervention patients.  May place patient in observation at [facility] or [place] if  equivalent level of care is available:: No  Covid Evaluation: Confirmed COVID Negative  Diagnosis: Gastroparesis [536.3.ICD-9-CM]  Admitting Physician: Toy Baker [[id number]]  Attending Physician: Toy Baker [[id number]]           Obs     Level of care  progressive tele indefinitely please discontinue once patient no longer qualifies COVID-19 Labs    Lab Results  Component Value Date   [place] 04/05/2022     Precautions: admitted as   Covid Negative       Dominick Zertuche 04/05/2022, 9:22 PM    [facility]     after 2 AM please page floor coverage PA If 7AM-7PM, please contact the day team taking care of the patient using [email or web address]   Patient was evaluated in the context of the global COVID-19 pandemic, which necessitated consideration that the patient might be at risk for infection with the SARS-CoV-2 virus that causes COVID-19. Institutional protocols and algorithms that pertain to the evaluation of patients at risk for COVID-19 are in a state of rapid change based on information released by regulatory bodies including the CDC and federal and state organizations. These policies and algorithms were followed during the patient's care.

## 2022-04-05 NOTE — Assessment & Plan Note (Signed)
Order sliding scale Continue home dose of Levemir 13 units twice daily

## 2022-04-05 NOTE — ED Notes (Addendum)
Alert, NAD, calm, interactive, some NV continues, c/o "abd pain, only slightly improved, 9/10". Skin W&D.

## 2022-04-05 NOTE — ED Triage Notes (Signed)
Pt arrived via POV, c/o n/v. Hx of gastroparesis and DKA

## 2022-04-05 NOTE — ED Notes (Signed)
Pt to CT

## 2022-04-05 NOTE — Subjective & Objective (Signed)
Patient complains of nausea vomiting history of gastroparesis with type 1 diabetes has had frequent admissions and ER visits.  Reports intractable nausea and vomiting typical for her gastroparesis. States that her to vomit at 9 AM and has been vomiting all day.  Reports abdominal pain reports small amount of streaks of red in her vomitus after she has been vomiting for quite some time.

## 2022-04-05 NOTE — ED Provider Triage Note (Signed)
Emergency Medicine Provider Triage Evaluation Note  Kelli Hudson , a 32 y.o. female  was evaluated in triage.  Pt complains of emesis.  Patient reports she woke up this morning with emesis around 9 AM.  Patient was recently hospitalized for intractable vomiting and has a history of gastroparesis and DKA.  Patient reports that she has diffuse abdominal pain without any focal finding.  Has tried to take oral nausea medication earlier today but reports that she vomited the medication.  Review of Systems  Positive: As above Negative: As above  Physical Exam  BP (!) 132/113 (BP Location: Left Arm)   Pulse (!) 121   Temp 99 F (37.2 C) (Oral)   Resp 18   SpO2 100%  Gen:   Awake, no distress   Resp:  Normal effort  MSK:   Moves extremities without difficulty  Other:  Diffuse abdominal pain  Medical Decision Making  Medically screening exam initiated at 12:46 PM.  Appropriate orders placed.  Kelli Hudson was informed that the remainder of the evaluation will be completed by another provider, this initial triage assessment does not replace that evaluation, and the importance of remaining in the ED until their evaluation is complete.     Kelli Heller, PA-C 04/05/22 1247

## 2022-04-05 NOTE — ED Notes (Signed)
Back from CT. Steady gait to b/r.

## 2022-04-05 NOTE — Assessment & Plan Note (Signed)
Supportive management Start Reglan Patient is requesting narcotics currently unable to tolerate p.o. once able to tolerate p.o. would try to avoid narcotics especially IV narcotics as this is likely contributed to have gastroparesis

## 2022-04-05 NOTE — ED Notes (Signed)
EDP at BS 

## 2022-04-05 NOTE — ED Notes (Signed)
Requesting "dilaudid", states "fentanyl didn't work", EDP notified.

## 2022-04-05 NOTE — Assessment & Plan Note (Signed)
Very mild couple of little streaks in the vomit.  After has been vomiting all day.  Likely secondary to Mallory-Weiss tear versus irritation.  Protonix twice daily.  Hemoglobin stable.  If persists or becomes significant could benefit from GI consult for right now observe and monitor

## 2022-04-06 DIAGNOSIS — K226 Gastro-esophageal laceration-hemorrhage syndrome: Secondary | ICD-10-CM | POA: Diagnosis present

## 2022-04-06 DIAGNOSIS — K3184 Gastroparesis: Secondary | ICD-10-CM | POA: Diagnosis present

## 2022-04-06 DIAGNOSIS — Z1152 Encounter for screening for COVID-19: Secondary | ICD-10-CM | POA: Diagnosis not present

## 2022-04-06 DIAGNOSIS — K219 Gastro-esophageal reflux disease without esophagitis: Secondary | ICD-10-CM | POA: Diagnosis present

## 2022-04-06 DIAGNOSIS — I129 Hypertensive chronic kidney disease with stage 1 through stage 4 chronic kidney disease, or unspecified chronic kidney disease: Secondary | ICD-10-CM | POA: Diagnosis present

## 2022-04-06 DIAGNOSIS — E1043 Type 1 diabetes mellitus with diabetic autonomic (poly)neuropathy: Secondary | ICD-10-CM | POA: Diagnosis present

## 2022-04-06 DIAGNOSIS — E860 Dehydration: Secondary | ICD-10-CM | POA: Diagnosis present

## 2022-04-06 DIAGNOSIS — N179 Acute kidney failure, unspecified: Secondary | ICD-10-CM | POA: Diagnosis present

## 2022-04-06 DIAGNOSIS — N182 Chronic kidney disease, stage 2 (mild): Secondary | ICD-10-CM | POA: Diagnosis present

## 2022-04-06 DIAGNOSIS — E1069 Type 1 diabetes mellitus with other specified complication: Secondary | ICD-10-CM | POA: Diagnosis present

## 2022-04-06 DIAGNOSIS — N3001 Acute cystitis with hematuria: Secondary | ICD-10-CM | POA: Diagnosis present

## 2022-04-06 DIAGNOSIS — I959 Hypotension, unspecified: Secondary | ICD-10-CM | POA: Diagnosis present

## 2022-04-06 DIAGNOSIS — I152 Hypertension secondary to endocrine disorders: Secondary | ICD-10-CM | POA: Diagnosis present

## 2022-04-06 DIAGNOSIS — Z79899 Other long term (current) drug therapy: Secondary | ICD-10-CM | POA: Diagnosis not present

## 2022-04-06 DIAGNOSIS — Z794 Long term (current) use of insulin: Secondary | ICD-10-CM | POA: Diagnosis not present

## 2022-04-06 DIAGNOSIS — R9431 Abnormal electrocardiogram [ECG] [EKG]: Secondary | ICD-10-CM | POA: Diagnosis present

## 2022-04-06 DIAGNOSIS — Z833 Family history of diabetes mellitus: Secondary | ICD-10-CM | POA: Diagnosis not present

## 2022-04-06 DIAGNOSIS — E785 Hyperlipidemia, unspecified: Secondary | ICD-10-CM | POA: Diagnosis present

## 2022-04-06 DIAGNOSIS — I10 Essential (primary) hypertension: Secondary | ICD-10-CM

## 2022-04-06 DIAGNOSIS — E1022 Type 1 diabetes mellitus with diabetic chronic kidney disease: Secondary | ICD-10-CM | POA: Diagnosis present

## 2022-04-06 DIAGNOSIS — E1065 Type 1 diabetes mellitus with hyperglycemia: Secondary | ICD-10-CM | POA: Diagnosis present

## 2022-04-06 DIAGNOSIS — E1143 Type 2 diabetes mellitus with diabetic autonomic (poly)neuropathy: Secondary | ICD-10-CM | POA: Diagnosis not present

## 2022-04-06 DIAGNOSIS — D500 Iron deficiency anemia secondary to blood loss (chronic): Secondary | ICD-10-CM | POA: Diagnosis present

## 2022-04-06 DIAGNOSIS — E8809 Other disorders of plasma-protein metabolism, not elsewhere classified: Secondary | ICD-10-CM | POA: Diagnosis not present

## 2022-04-06 DIAGNOSIS — E039 Hypothyroidism, unspecified: Secondary | ICD-10-CM | POA: Diagnosis present

## 2022-04-06 DIAGNOSIS — N3000 Acute cystitis without hematuria: Secondary | ICD-10-CM | POA: Diagnosis not present

## 2022-04-06 LAB — BASIC METABOLIC PANEL
Anion gap: 16 — ABNORMAL HIGH (ref 5–15)
BUN: 23 mg/dL — ABNORMAL HIGH (ref 6–20)
CO2: 22 mmol/L (ref 22–32)
Calcium: 9.9 mg/dL (ref 8.9–10.3)
Chloride: 98 mmol/L (ref 98–111)
Creatinine, Ser: 1.39 mg/dL — ABNORMAL HIGH (ref 0.44–1.00)
GFR, Estimated: 52 mL/min — ABNORMAL LOW (ref >60)
Glucose, Bld: 270 mg/dL — ABNORMAL HIGH (ref 70–99)
Potassium: 4.1 mmol/L (ref 3.5–5.1)
Sodium: 136 mmol/L (ref 135–145)

## 2022-04-06 LAB — CREATININE, URINE, RANDOM: Creatinine, Urine: 31 mg/dL

## 2022-04-06 LAB — CK: Total CK: 122 U/L (ref 38–234)

## 2022-04-06 LAB — GLUCOSE, CAPILLARY
Glucose-Capillary: 177 mg/dL — ABNORMAL HIGH (ref 70–99)
Glucose-Capillary: 212 mg/dL — ABNORMAL HIGH (ref 70–99)
Glucose-Capillary: 255 mg/dL — ABNORMAL HIGH (ref 70–99)
Glucose-Capillary: 293 mg/dL — ABNORMAL HIGH (ref 70–99)
Glucose-Capillary: 304 mg/dL — ABNORMAL HIGH (ref 70–99)

## 2022-04-06 LAB — LACTIC ACID, PLASMA
Lactic Acid, Venous: 1.8 mmol/L (ref 0.5–1.9)
Lactic Acid, Venous: 1.6 mmol/L (ref 0.5–1.9)

## 2022-04-06 LAB — OSMOLALITY: Osmolality: 308 mOsm/kg — ABNORMAL HIGH (ref 275–295)

## 2022-04-06 LAB — SODIUM, URINE, RANDOM: Sodium, Ur: 160 mmol/L

## 2022-04-06 LAB — MAGNESIUM: Magnesium: 1.9 mg/dL (ref 1.7–2.4)

## 2022-04-06 LAB — OSMOLALITY, URINE: Osmolality, Ur: 546 mOsm/kg (ref 300–900)

## 2022-04-06 LAB — TSH: TSH: 2.304 u[IU]/mL (ref 0.350–4.500)

## 2022-04-06 LAB — PHOSPHORUS: Phosphorus: 4.3 mg/dL (ref 2.5–4.6)

## 2022-04-06 MED ORDER — MORPHINE SULFATE 15 MG PO TABS
7.5000 mg | ORAL_TABLET | ORAL | Status: DC | PRN
  Administered 2022-04-06: 15 mg via ORAL
  Administered 2022-04-07: 7.5 mg via ORAL
  Filled 2022-04-06 (×2): qty 1

## 2022-04-06 MED ORDER — OXYCODONE-ACETAMINOPHEN 5-325 MG PO TABS: 1.0000 | ORAL_TABLET | ORAL | Status: DC | PRN

## 2022-04-06 MED ORDER — LACTATED RINGERS IV SOLN: INTRAVENOUS | Status: DC

## 2022-04-06 NOTE — Progress Notes (Addendum)
PROGRESS NOTE    Kelli Hudson  [id number] DOB: [birth year] DOA: 04/05/2022 PCP: [name], MD   Brief Narrative: Kelli Hudson is a 32 y.o. female with a history of diabetes mellitus type 1, gastroparesis, hypertension, CKD stage II, hyperlipidemia, peripheral neuropathy, GERD, anemia. Patient presented secondary to intractable nausea/vomiting with concern for gastroparesis flare. IV fluids started. Bowel rest. Advance as tolerated.   Assessment and Plan:  Gastroparesis flare Patient follows with Dr. Havery Moros as an outpatient. Patient thinks she ate something that caused her flare. Patient also has very poorly controlled diabetes, which is likely contributing. Symptoms are improved this morning and patient is hoping to advance diet. -Advance diet as tolerated up to carb modified diet  Diabetes mellitus type 1 Uncontrolled with hyperglycemia. Hemoglobin A1C of 11.2% from two weeks prior. -Continue Levemir and SSI  Hematemesis Secondary to recurrent emesis. Likely related to Mallory-Weiss tears. Appears to be resolved. Hemoglobin stable.  Primary hypertension -Continue Coreg  Hyperlipidemia -Continue Lipitor  Acute cystitis Noted on imaging. Urinalysis not very suggestive for urinary infection. Empiric Ceftriaxone started and urine culture pending.  Microscopic hematuria Noted on urinalysis. Possible infection. Will need outpatient follow-up/repeat urinalysis.   DVT prophylaxis: SCDs Code Status:   Code Status: Prior Family Communication: None at bedside Disposition Plan: Discharge home possibly in 1 day if able to tolerate oral diet   Consultants:  None  Procedures:  None  Antimicrobials: None    Subjective: Patient reports improvement of nausea/vomiting.  Objective: BP 93/62 (BP Location: Left Arm)   Pulse (!) 111   Temp 98.7 F (37.1 C) (Oral)   Resp 13   SpO2 100%   Examination:  General exam: Appears calm and  comfortable Respiratory system: Clear to auscultation. Respiratory effort normal. Cardiovascular system: S1 & S2 heard, RRR. No murmurs, rubs, gallops or clicks. Gastrointestinal system: Abdomen is nondistended, soft and with mild epigastric tenderness. Normal bowel sounds heard. Central nervous system: Alert and oriented. No focal neurological deficits. Musculoskeletal: No edema. No calf tenderness Skin: No cyanosis. No rashes Psychiatry: Judgement and insight appear normal. Mood & affect appropriate.    Data Reviewed: I have personally reviewed following labs and imaging studies  CBC Lab Results  Component Value Date   WBC 10.8 (H) 04/05/2022   RBC 4.41 04/05/2022   HGB 12.6 04/05/2022   HCT 38.7 04/05/2022   MCV 87.8 04/05/2022   MCH 28.6 04/05/2022   PLT 474 (H) 04/05/2022   MCHC 32.6 04/05/2022   RDW 13.8 04/05/2022   LYMPHSABS 2.0 04/05/2022   MONOABS 0.5 04/05/2022   EOSABS 0.1 04/05/2022   BASOSABS 0.1 [id number]     Last metabolic panel Lab Results  Component Value Date   NA 136 04/06/2022   K 4.1 04/06/2022   CL 98 04/06/2022   CO2 22 04/06/2022   BUN 23 (H) 04/06/2022   CREATININE 1.39 (H) 04/06/2022   GLUCOSE 270 (H) 04/06/2022   GFRNONAA 52 (L) 04/06/2022   GFRAA >60 10/03/2019   CALCIUM 9.9 04/06/2022   PHOS 4.3 04/06/2022   PROT 8.3 (H) 04/05/2022   ALBUMIN 3.7 04/05/2022   BILITOT 0.7 04/05/2022   ALKPHOS 111 04/05/2022   AST 33 04/05/2022   ALT 48 (H) 04/05/2022   ANIONGAP 16 (H) 04/06/2022    GFR: CrCl cannot be calculated (Unknown ideal weight.).  Recent Results (from the past 240 hour(s))  Resp panel by RT-PCR (RSV, Flu A&B, Covid) Anterior Nasal Swab     Status: None  Collection Time: 04/05/22  1:50 PM   Specimen: Anterior Nasal Swab  Result Value Ref Range Status   SARS Coronavirus 2 by RT PCR NEGATIVE NEGATIVE Final    Comment: (NOTE) SARS-CoV-2 target nucleic acids are NOT DETECTED.  The SARS-CoV-2 RNA is generally detectable  in upper respiratory specimens during the acute phase of infection. The lowest concentration of SARS-CoV-2 viral copies this assay can detect is 138 copies/mL. A negative result does not preclude SARS-Cov-2 infection and should not be used as the sole basis for treatment or other patient management decisions. A negative result may occur with  improper specimen collection/handling, submission of specimen other than nasopharyngeal swab, presence of viral mutation(s) within the areas targeted by this assay, and inadequate number of viral copies(<138 copies/mL). A negative result must be combined with clinical observations, patient history, and epidemiological information. The expected result is Negative.  Fact Sheet for Patients:  EntrepreneurPulse.com.au  Fact Sheet for Healthcare Providers:  IncredibleEmployment.be  This test is no t yet approved or cleared by the [place] FDA and  has been authorized for detection and/or diagnosis of SARS-CoV-2 by FDA under an Emergency Use Authorization (EUA). This EUA will remain  in effect (meaning this test can be used) for the duration of the COVID-19 declaration under Section 564(b)(1) of the Act, 21 U.S.C.section 360bbb-3(b)(1), unless the authorization is terminated  or revoked sooner.       Influenza A by PCR NEGATIVE NEGATIVE Final   Influenza B by PCR NEGATIVE NEGATIVE Final    Comment: (NOTE) The Xpert Xpress SARS-CoV-2/FLU/RSV plus assay is intended as an aid in the diagnosis of influenza from Nasopharyngeal swab specimens and should not be used as a sole basis for treatment. Nasal washings and aspirates are unacceptable for Xpert Xpress SARS-CoV-2/FLU/RSV testing.  Fact Sheet for Patients: EntrepreneurPulse.com.au  Fact Sheet for Healthcare Providers: IncredibleEmployment.be  This test is not yet approved or cleared by the [place] FDA and has  been authorized for detection and/or diagnosis of SARS-CoV-2 by FDA under an Emergency Use Authorization (EUA). This EUA will remain in effect (meaning this test can be used) for the duration of the COVID-19 declaration under Section 564(b)(1) of the Act, 21 U.S.C. section 360bbb-3(b)(1), unless the authorization is terminated or revoked.     Resp Syncytial Virus by PCR NEGATIVE NEGATIVE Final    Comment: (NOTE) Fact Sheet for Patients: EntrepreneurPulse.com.au  Fact Sheet for Healthcare Providers: IncredibleEmployment.be  This test is not yet approved or cleared by the [place] FDA and has been authorized for detection and/or diagnosis of SARS-CoV-2 by FDA under an Emergency Use Authorization (EUA). This EUA will remain in effect (meaning this test can be used) for the duration of the COVID-19 declaration under Section 564(b)(1) of the Act, 21 U.S.C. section 360bbb-3(b)(1), unless the authorization is terminated or revoked.  Performed at [facility], [place] [address] [place], [place] [address]       Radiology Studies: CT Angio Chest PE W and/or Wo Contrast  Result Date: 04/05/2022 CLINICAL DATA:  Positive D-dimer EXAM: CT ANGIOGRAPHY CHEST WITH CONTRAST TECHNIQUE: Multidetector CT imaging of the chest was performed using the standard protocol during bolus administration of intravenous contrast. Multiplanar CT image reconstructions and MIPs were obtained to evaluate the vascular anatomy. RADIATION DOSE REDUCTION: This exam was performed according to the departmental dose-optimization program which includes automated exposure control, adjustment of the mA and/or kV according to patient size and/or use of iterative reconstruction technique. CONTRAST:  72m  OMNIPAQUE IOHEXOL 350 MG/ML SOLN COMPARISON:  Chest x-ray from earlier in the same day. FINDINGS: Cardiovascular: Thoracic aorta shows a normal branching pattern. No  aneurysmal dilatation or dissection is seen. No cardiac enlargement is noted. The pulmonary artery shows a normal branching pattern bilaterally. No filling defect to suggest pulmonary embolism is noted. No coronary calcifications are seen. Mediastinum/Nodes: Thoracic inlet is within normal limits. No hilar or mediastinal adenopathy is noted. The esophagus is distended with air which may be related to reflux. Lungs/Pleura: Lungs are well aerated bilaterally. No focal infiltrate or sizable effusion is seen. No parenchymal nodules are seen. No pneumothorax is noted. Upper Abdomen: As described on earlier CT of the abdomen and pelvis. Musculoskeletal: No acute rib abnormality is noted. No compression deformity is seen. Review of the MIP images confirms the above findings. IMPRESSION: No evidence of pulmonary emboli. No acute abnormality noted. Electronically Signed   By: Inez Catalina M.D.   On: 04/05/2022 17:04   CT ABDOMEN PELVIS W CONTRAST  Result Date: 04/05/2022 CLINICAL DATA:  RLQ abdominal pain EXAM: CT ABDOMEN AND PELVIS WITH CONTRAST TECHNIQUE: Multidetector CT imaging of the abdomen and pelvis was performed using the standard protocol following bolus administration of intravenous contrast. RADIATION DOSE REDUCTION: This exam was performed according to the departmental dose-optimization program which includes automated exposure control, adjustment of the mA and/or kV according to patient size and/or use of iterative reconstruction technique. CONTRAST:  132m OMNIPAQUE IOHEXOL 300 MG/ML  SOLN COMPARISON:  CT AP, 03/22/2022 and 08/24/2021. FINDINGS: Lower chest: No acute abnormality. Hepatobiliary: No focal liver abnormality is seen. No gallstones, gallbladder wall thickening, or biliary dilatation. Pancreas: No pancreatic ductal dilatation or surrounding inflammatory changes. Spleen: Normal in size without focal abnormality. Adrenals/Urinary Tract: Adrenal glands are unremarkable. Kidneys are normal, without  renal calculi, focal lesion, or hydronephrosis. Small volume of dependent gas within the bladder, correlate for recent catheterization. Additional non dependent gas within the urinary bladder, with additional trace focus of gas within the Retzius space. Stomach/Bowel: Stomach is within normal limits. Appendix appears normal. No evidence of bowel wall thickening, distention, or inflammatory changes. Vascular/Lymphatic: No significant vascular findings are present. No enlarged abdominal or pelvic lymph nodes. Reproductive: Uterus and adnexa are unremarkable. Other: No abdominal wall hernia or abnormality. No abdominopelvic ascites. No intraperitoneal free air. Musculoskeletal: No acute or significant osseous findings. IMPRESSION: 1. Emphysematous cystitis. 2. Small volume dependent gas within the urinary bladder. Correlate for catheterization Electronically Signed   By: JMichaelle BirksM.D.   On: 04/05/2022 15:04   DG Chest Portable 1 View  Result Date: 04/05/2022 CLINICAL DATA:  chest pain EXAM: PORTABLE CHEST - 1 VIEW COMPARISON:  01/03/2022 FINDINGS: Cardiac silhouette is unremarkable. No pneumothorax or pleural effusion. The lungs are clear. The visualized skeletal structures are unremarkable. IMPRESSION: No acute cardiopulmonary process. Electronically Signed   By: JSammie BenchM.D.   On: 04/05/2022 14:07      LOS: 0 days    RCordelia Poche MD [facility] 04/06/2022, 11:44 AM   If 7PM-7AM, please contact night-coverage [email or web address]

## 2022-04-06 NOTE — Hospital Course (Signed)
Kelli Hudson is a 32 y.o. female with a history of diabetes mellitus type 1, gastroparesis, hypertension, CKD stage II, hyperlipidemia, peripheral neuropathy, GERD, anemia. Patient presented secondary to intractable nausea/vomiting with concern for gastroparesis flare. IV fluids started. Bowel rest. Advance as tolerated.

## 2022-04-07 ENCOUNTER — Other Ambulatory Visit: Payer: Self-pay

## 2022-04-07 DIAGNOSIS — I10 Essential (primary) hypertension: Secondary | ICD-10-CM | POA: Diagnosis not present

## 2022-04-07 DIAGNOSIS — K3184 Gastroparesis: Secondary | ICD-10-CM | POA: Diagnosis not present

## 2022-04-07 DIAGNOSIS — I152 Hypertension secondary to endocrine disorders: Secondary | ICD-10-CM | POA: Diagnosis not present

## 2022-04-07 DIAGNOSIS — E1022 Type 1 diabetes mellitus with diabetic chronic kidney disease: Secondary | ICD-10-CM | POA: Diagnosis not present

## 2022-04-07 LAB — SODIUM, URINE, RANDOM: Sodium, Ur: 78 mmol/L

## 2022-04-07 LAB — BASIC METABOLIC PANEL
Anion gap: 7 (ref 5–15)
BUN: 32 mg/dL — ABNORMAL HIGH (ref 6–20)
CO2: 24 mmol/L (ref 22–32)
Calcium: 8.9 mg/dL (ref 8.9–10.3)
Chloride: 102 mmol/L (ref 98–111)
Creatinine, Ser: 1.92 mg/dL — ABNORMAL HIGH (ref 0.44–1.00)
GFR, Estimated: 36 mL/min — ABNORMAL LOW (ref >60)
Glucose, Bld: 120 mg/dL — ABNORMAL HIGH (ref 70–99)
Potassium: 4.4 mmol/L (ref 3.5–5.1)
Sodium: 133 mmol/L — ABNORMAL LOW (ref 135–145)

## 2022-04-07 LAB — URINE CULTURE

## 2022-04-07 LAB — GLUCOSE, CAPILLARY
Glucose-Capillary: 112 mg/dL — ABNORMAL HIGH (ref 70–99)
Glucose-Capillary: 217 mg/dL — ABNORMAL HIGH (ref 70–99)
Glucose-Capillary: 232 mg/dL — ABNORMAL HIGH (ref 70–99)
Glucose-Capillary: 242 mg/dL — ABNORMAL HIGH (ref 70–99)
Glucose-Capillary: 247 mg/dL — ABNORMAL HIGH (ref 70–99)
Glucose-Capillary: 82 mg/dL (ref 70–99)

## 2022-04-07 LAB — OSMOLALITY, URINE: Osmolality, Ur: 276 mOsm/kg — ABNORMAL LOW (ref 300–900)

## 2022-04-07 MED ORDER — ENOXAPARIN SODIUM 40 MG/0.4ML IJ SOSY
40.0000 mg | PREFILLED_SYRINGE | INTRAMUSCULAR | Status: DC
  Administered 2022-04-07 – 2022-04-09 (×3): 40 mg via SUBCUTANEOUS
  Filled 2022-04-07 (×3): qty 0.4

## 2022-04-07 MED ORDER — SODIUM CHLORIDE 0.9 % IV BOLUS
1000.0000 mL | Freq: Once | INTRAVENOUS | Status: AC
  Administered 2022-04-07: 1000 mL via INTRAVENOUS

## 2022-04-07 MED ORDER — HYDROMORPHONE HCL 1 MG/ML IJ SOLN
1.0000 mg | INTRAMUSCULAR | Status: DC | PRN
  Administered 2022-04-07 – 2022-04-09 (×9): 1 mg via INTRAVENOUS
  Filled 2022-04-07 (×9): qty 1

## 2022-04-07 NOTE — TOC CM/SW Note (Signed)
  Transition of Care (TOC) Screening Note   Patient Details  Name: Kelli Hudson Date of Birth: [birth year]   Transition [facility] Care [facility]) CM/SW Contact:    Lennart Pall, LCSW Phone Number: 04/07/2022, 11:47 AM    Transition of Care Department [facility]) has reviewed patient and no TOC needs have been identified at this time. We will continue to monitor patient advancement through interdisciplinary progression rounds. If new patient transition needs arise, please place a TOC consult.

## 2022-04-07 NOTE — Progress Notes (Signed)
PROGRESS NOTE    Kelli Hudson  [id number] DOB: 09/14/1990 DOA: 04/05/2022 PCP: No primary care provider on file.   Brief Narrative: Kelli Hudson is a 32 y.o. female with a history of diabetes mellitus type 1, gastroparesis, hypertension, CKD stage II, hyperlipidemia, peripheral neuropathy, GERD, anemia. Patient presented secondary to intractable nausea/vomiting with concern for gastroparesis flare. IV fluids started. Bowel rest. Advance as tolerated.   Assessment and Plan:  Gastroparesis flare Patient follows with Dr. Havery Moros as an outpatient. Patient thinks she ate something that caused her flare. Patient also has very poorly controlled diabetes, which is likely contributing. Symptoms improved this morning, but patient developed recurrent emesis. -Continue diet for now -Continue antiemetics and analgesics PRN  AKI Baseline creatinine of 1-1.1. Creatinine of 1.37, up to 1.92 this morning. Possibly related to dehydration and/or relative hypotension. -IV fluids -Repeat BMP in AM  Diabetes mellitus type 1 Uncontrolled with hyperglycemia. Hemoglobin A1C of 11.2% from two weeks prior. -Continue Levemir and SSI  Hematemesis Secondary to recurrent emesis. Likely related to Mallory-Weiss tears. Appears to be resolved. Hemoglobin stable.  Primary hypertension -Continue Coreg  Hyperlipidemia -Continue Lipitor  Acute cystitis Noted on imaging. Urinalysis not very suggestive for urinary infection. Empiric Ceftriaxone started and urine culture significant for multiple species. -Continue Ceftriaxone x3 days  Microscopic hematuria Noted on urinalysis. Possible infection. Will need outpatient follow-up/repeat urinalysis.   DVT prophylaxis: SCDs Code Status:   Code Status: Prior Family Communication: None at bedside Disposition Plan: Discharge home possibly in 1 day if able to tolerate oral diet   Consultants:  None  Procedures:   None  Antimicrobials: Ceftriaxone    Subjective: This morning, patient reported significant improvement of symptoms. This afternoon, patient has had recurrent emesis.  Objective: BP (!) 160/100 (BP Location: Left Arm)   Pulse (!) 106   Temp 98.2 F (36.8 C) (Oral)   Resp 20   Wt 80 kg   SpO2 100%   BMI 26.82 kg/m   Examination:  General exam: Appears calm and comfortable Respiratory system: Clear to auscultation. Respiratory effort normal. Cardiovascular system: S1 & S2 heard, tachycardia, normal rhythm. Gastrointestinal system: Abdomen is nondistended, soft and nontender. Normal bowel sounds heard. Central nervous system: Alert and oriented. No focal neurological deficits. Musculoskeletal: No edema. No calf tenderness Skin: No cyanosis. No rashes Psychiatry: Judgement and insight appear normal. Mood & affect appropriate.    Data Reviewed: I have personally reviewed following labs and imaging studies  CBC Lab Results  Component Value Date   WBC 10.8 (H) 04/05/2022   RBC 4.41 04/05/2022   HGB 12.6 04/05/2022   HCT 38.7 04/05/2022   MCV 87.8 04/05/2022   MCH 28.6 04/05/2022   PLT 474 (H) 04/05/2022   MCHC 32.6 04/05/2022   RDW 13.8 04/05/2022   LYMPHSABS 2.0 04/05/2022   MONOABS 0.5 04/05/2022   EOSABS 0.1 04/05/2022   BASOSABS 0.1 123XX123     Last metabolic panel Lab Results  Component Value Date   NA 133 (L) 04/07/2022   K 4.4 04/07/2022   CL 102 04/07/2022   CO2 24 04/07/2022   BUN 32 (H) 04/07/2022   CREATININE 1.92 (H) 04/07/2022   GLUCOSE 120 (H) 04/07/2022   GFRNONAA 36 (L) 04/07/2022   GFRAA >60 10/03/2019   CALCIUM 8.9 04/07/2022   PHOS 4.3 04/06/2022   PROT 8.3 (H) 04/05/2022   ALBUMIN 3.7 04/05/2022   BILITOT 0.7 04/05/2022   ALKPHOS 111 04/05/2022   AST 33 04/05/2022   ALT  48 (H) 04/05/2022   ANIONGAP 7 04/07/2022    GFR: Estimated Creatinine Clearance: 47.5 mL/min (A) (by C-G formula based on SCr of 1.92 mg/dL  (H)).  Recent Results (from the past 240 hour(s))  Resp panel by RT-PCR (RSV, Flu A&B, Covid) Anterior Nasal Swab     Status: None   Collection Time: 04/05/22  1:50 PM   Specimen: Anterior Nasal Swab  Result Value Ref Range Status   SARS Coronavirus 2 by RT PCR NEGATIVE NEGATIVE Final    Comment: (NOTE) SARS-CoV-2 target nucleic acids are NOT DETECTED.  The SARS-CoV-2 RNA is generally detectable in upper respiratory specimens during the acute phase of infection. The lowest concentration of SARS-CoV-2 viral copies this assay can detect is 138 copies/mL. A negative result does not preclude SARS-Cov-2 infection and should not be used as the sole basis for treatment or other patient management decisions. A negative result may occur with  improper specimen collection/handling, submission of specimen other than nasopharyngeal swab, presence of viral mutation(s) within the areas targeted by this assay, and inadequate number of viral copies(<138 copies/mL). A negative result must be combined with clinical observations, patient history, and epidemiological information. The expected result is Negative.  Fact Sheet for Patients:  EntrepreneurPulse.com.au  Fact Sheet for Healthcare Providers:  IncredibleEmployment.be  This test is no t yet approved or cleared by the [place] FDA and  has been authorized for detection and/or diagnosis of SARS-CoV-2 by FDA under an Emergency Use Authorization (EUA). This EUA will remain  in effect (meaning this test can be used) for the duration of the COVID-19 declaration under Section 564(b)(1) of the Act, 21 U.S.C.section 360bbb-3(b)(1), unless the authorization is terminated  or revoked sooner.       Influenza A by PCR NEGATIVE NEGATIVE Final   Influenza B by PCR NEGATIVE NEGATIVE Final    Comment: (NOTE) The Xpert Xpress SARS-CoV-2/FLU/RSV plus assay is intended as an aid in the diagnosis of influenza from  Nasopharyngeal swab specimens and should not be used as a sole basis for treatment. Nasal washings and aspirates are unacceptable for Xpert Xpress SARS-CoV-2/FLU/RSV testing.  Fact Sheet for Patients: EntrepreneurPulse.com.au  Fact Sheet for Healthcare Providers: IncredibleEmployment.be  This test is not yet approved or cleared by the [place] FDA and has been authorized for detection and/or diagnosis of SARS-CoV-2 by FDA under an Emergency Use Authorization (EUA). This EUA will remain in effect (meaning this test can be used) for the duration of the COVID-19 declaration under Section 564(b)(1) of the Act, 21 U.S.C. section 360bbb-3(b)(1), unless the authorization is terminated or revoked.     Resp Syncytial Virus by PCR NEGATIVE NEGATIVE Final    Comment: (NOTE) Fact Sheet for Patients: EntrepreneurPulse.com.au  Fact Sheet for Healthcare Providers: IncredibleEmployment.be  This test is not yet approved or cleared by the [place] FDA and has been authorized for detection and/or diagnosis of SARS-CoV-2 by FDA under an Emergency Use Authorization (EUA). This EUA will remain in effect (meaning this test can be used) for the duration of the COVID-19 declaration under Section 564(b)(1) of the Act, 21 U.S.C. section 360bbb-3(b)(1), unless the authorization is terminated or revoked.  Performed at [facility], [place] [address] [place], [place] [address]   Urine Culture (for pregnant, neutropenic or urologic patients or patients with an indwelling urinary catheter)     Status: Abnormal   Collection Time: 04/05/22  5:14 PM   Specimen: Urine, Clean Catch  Result Value Ref Range Status  Specimen Description   Final    URINE, CLEAN CATCH Performed at [facility], [place] [address], [place], [place] [address]    Special Requests   Final    NONE Performed at [facility], [place] [address] [place], [place] [address]    Culture MULTIPLE SPECIES PRESENT, SUGGEST RECOLLECTION (A)  Final   Report Status 04/07/2022 FINAL  Final      Radiology Studies: CT Angio Chest PE W and/or Wo Contrast  Result Date: 04/05/2022 CLINICAL DATA:  Positive D-dimer EXAM: CT ANGIOGRAPHY CHEST WITH CONTRAST TECHNIQUE: Multidetector CT imaging of the chest was performed using the standard protocol during bolus administration of intravenous contrast. Multiplanar CT image reconstructions and MIPs were obtained to evaluate the vascular anatomy. RADIATION DOSE REDUCTION: This exam was performed according to the departmental dose-optimization program which includes automated exposure control, adjustment of the mA and/or kV according to patient size and/or use of iterative reconstruction technique. CONTRAST:  49m OMNIPAQUE IOHEXOL 350 MG/ML SOLN COMPARISON:  Chest x-ray from earlier in the same day. FINDINGS: Cardiovascular: Thoracic aorta shows a normal branching pattern. No aneurysmal dilatation or dissection is seen. No cardiac enlargement is noted. The pulmonary artery shows a normal branching pattern bilaterally. No filling defect to suggest pulmonary embolism is noted. No coronary calcifications are seen. Mediastinum/Nodes: Thoracic inlet is within normal limits. No hilar or mediastinal adenopathy is noted. The esophagus is distended with air which may be related to reflux. Lungs/Pleura: Lungs are well aerated bilaterally. No focal infiltrate or sizable effusion is seen. No parenchymal nodules are seen. No pneumothorax is noted. Upper Abdomen: As described on earlier CT of the abdomen and pelvis. Musculoskeletal: No acute rib abnormality is noted. No compression deformity is seen. Review of the MIP images confirms the above findings. IMPRESSION: No evidence of pulmonary emboli. No acute abnormality noted. Electronically Signed   By: MInez CatalinaM.D.   On: 04/05/2022 17:04    CT ABDOMEN PELVIS W CONTRAST  Result Date: 04/05/2022 CLINICAL DATA:  RLQ abdominal pain EXAM: CT ABDOMEN AND PELVIS WITH CONTRAST TECHNIQUE: Multidetector CT imaging of the abdomen and pelvis was performed using the standard protocol following bolus administration of intravenous contrast. RADIATION DOSE REDUCTION: This exam was performed according to the departmental dose-optimization program which includes automated exposure control, adjustment of the mA and/or kV according to patient size and/or use of iterative reconstruction technique. CONTRAST:  1067mOMNIPAQUE IOHEXOL 300 MG/ML  SOLN COMPARISON:  CT AP, 03/22/2022 and 08/24/2021. FINDINGS: Lower chest: No acute abnormality. Hepatobiliary: No focal liver abnormality is seen. No gallstones, gallbladder wall thickening, or biliary dilatation. Pancreas: No pancreatic ductal dilatation or surrounding inflammatory changes. Spleen: Normal in size without focal abnormality. Adrenals/Urinary Tract: Adrenal glands are unremarkable. Kidneys are normal, without renal calculi, focal lesion, or hydronephrosis. Small volume of dependent gas within the bladder, correlate for recent catheterization. Additional non dependent gas within the urinary bladder, with additional trace focus of gas within the Retzius space. Stomach/Bowel: Stomach is within normal limits. Appendix appears normal. No evidence of bowel wall thickening, distention, or inflammatory changes. Vascular/Lymphatic: No significant vascular findings are present. No enlarged abdominal or pelvic lymph nodes. Reproductive: Uterus and adnexa are unremarkable. Other: No abdominal wall hernia or abnormality. No abdominopelvic ascites. No intraperitoneal free air. Musculoskeletal: No acute or significant osseous findings. IMPRESSION: 1. Emphysematous cystitis. 2. Small volume dependent gas within the urinary bladder. Correlate for catheterization Electronically Signed   By: JoFrancesco Runner.  On: 04/05/2022 15:04    DG Chest Portable 1 View  Result Date: 04/05/2022 CLINICAL DATA:  chest pain EXAM: PORTABLE CHEST - 1 VIEW COMPARISON:  01/03/2022 FINDINGS: Cardiac silhouette is unremarkable. No pneumothorax or pleural effusion. The lungs are clear. The visualized skeletal structures are unremarkable. IMPRESSION: No acute cardiopulmonary process. Electronically Signed   By: Sammie Bench M.D.   On: 04/05/2022 14:07      LOS: 1 day    Cordelia Poche, MD [facility] 04/07/2022, 1:04 PM   If 7PM-7AM, please contact night-coverage [email or web address]

## 2022-04-08 DIAGNOSIS — I10 Essential (primary) hypertension: Secondary | ICD-10-CM | POA: Diagnosis not present

## 2022-04-08 DIAGNOSIS — I152 Hypertension secondary to endocrine disorders: Secondary | ICD-10-CM | POA: Diagnosis not present

## 2022-04-08 DIAGNOSIS — K3184 Gastroparesis: Secondary | ICD-10-CM | POA: Diagnosis not present

## 2022-04-08 DIAGNOSIS — E1022 Type 1 diabetes mellitus with diabetic chronic kidney disease: Secondary | ICD-10-CM | POA: Diagnosis not present

## 2022-04-08 LAB — GLUCOSE, CAPILLARY
Glucose-Capillary: 131 mg/dL — ABNORMAL HIGH (ref 70–99)
Glucose-Capillary: 179 mg/dL — ABNORMAL HIGH (ref 70–99)
Glucose-Capillary: 192 mg/dL — ABNORMAL HIGH (ref 70–99)
Glucose-Capillary: 199 mg/dL — ABNORMAL HIGH (ref 70–99)
Glucose-Capillary: 239 mg/dL — ABNORMAL HIGH (ref 70–99)
Glucose-Capillary: 260 mg/dL — ABNORMAL HIGH (ref 70–99)

## 2022-04-08 LAB — BASIC METABOLIC PANEL
Anion gap: 6 (ref 5–15)
BUN: 20 mg/dL (ref 6–20)
CO2: 22 mmol/L (ref 22–32)
Calcium: 9.3 mg/dL (ref 8.9–10.3)
Chloride: 105 mmol/L (ref 98–111)
Creatinine, Ser: 0.97 mg/dL (ref 0.44–1.00)
GFR, Estimated: >60 mL/min (ref >60)
Glucose, Bld: 201 mg/dL — ABNORMAL HIGH (ref 70–99)
Potassium: 5 mmol/L (ref 3.5–5.1)
Sodium: 133 mmol/L — ABNORMAL LOW (ref 135–145)

## 2022-04-08 NOTE — Progress Notes (Signed)
PROGRESS NOTE    Kelli Hudson  [id number] DOB: November 17, 1990 DOA: 04/05/2022 PCP: No primary care provider on file.   Brief Narrative: Kelli Hudson is a 31 y.o. female with a history of diabetes mellitus type 1, gastroparesis, hypertension, CKD stage II, hyperlipidemia, peripheral neuropathy, GERD, anemia. Patient presented secondary to intractable nausea/vomiting with concern for gastroparesis flare. IV fluids started. Bowel rest. Advance as tolerated.   Assessment and Plan:  Gastroparesis flare Patient follows with Dr. Havery Moros as an outpatient. Patient thinks she ate something that caused her flare. Patient also has very poorly controlled diabetes, which is likely contributing. Symptoms improved this morning, but patient developed recurrent emesis. -Continue diet for now -Continue antiemetics and analgesics PRN  AKI Baseline creatinine of 1-1.1. Creatinine of 1.37, up to 1.92 this morning. Possibly related to dehydration and/or relative hypotension. Resolved with IV fluids. -IV fluids  Diabetes mellitus type 1 Uncontrolled with hyperglycemia. Hemoglobin A1C of 11.2% from two weeks prior. -Continue Levemir and SSI  Hematemesis Secondary to recurrent emesis. Likely related to Mallory-Weiss tears. Appears to be resolved. Hemoglobin stable.  Primary hypertension -Continue Coreg  Hyperlipidemia -Continue Lipitor  Acute cystitis Noted on imaging. Urinalysis not very suggestive for urinary infection. Empiric Ceftriaxone started and urine culture significant for multiple species. Completed Ceftriaxone x3 days  Microscopic hematuria Noted on urinalysis. Possible infection. Will need outpatient follow-up/repeat urinalysis.   DVT prophylaxis: SCDs Code Status:   Code Status: Prior Family Communication: None at bedside Disposition Plan: Discharge home possibly in 1 day if able to tolerate oral diet   Consultants:  None  Procedures:   None  Antimicrobials: Ceftriaxone    Subjective: Patient with an episode of emesis yesterday and recurrent emesis this morning.  Objective: BP (!) 161/108 (BP Location: Left Arm)   Pulse 100   Temp (!) 97.3 F (36.3 C) (Oral)   Resp 19   Wt 80 kg   SpO2 100%   BMI 26.82 kg/m   Examination:  General exam: Appears calm but look slightly ill. Respiratory system: Clear to auscultation. Respiratory effort normal. Cardiovascular system: S1 & S2 heard, RRR. No murmurs, rubs, gallops or clicks. Gastrointestinal system: Abdomen is nondistended, soft and slightly tender. Normal bowel sounds heard. Central nervous system: Alert and oriented. No focal neurological deficits. Musculoskeletal: No edema. No calf tenderness Skin: No cyanosis. No rashes Psychiatry: Judgement and insight appear normal. Mood & affect appropriate.    Data Reviewed: I have personally reviewed following labs and imaging studies  CBC Lab Results  Component Value Date   WBC 10.8 (H) 04/05/2022   RBC 4.41 04/05/2022   HGB 12.6 04/05/2022   HCT 38.7 04/05/2022   MCV 87.8 04/05/2022   MCH 28.6 04/05/2022   PLT 474 (H) 04/05/2022   MCHC 32.6 04/05/2022   RDW 13.8 04/05/2022   LYMPHSABS 2.0 04/05/2022   MONOABS 0.5 04/05/2022   EOSABS 0.1 04/05/2022   BASOSABS 0.1 123XX123     Last metabolic panel Lab Results  Component Value Date   NA 133 (L) 04/08/2022   K 5.0 04/08/2022   CL 105 04/08/2022   CO2 22 04/08/2022   BUN 20 04/08/2022   CREATININE 0.97 04/08/2022   GLUCOSE 201 (H) 04/08/2022   GFRNONAA >60 04/08/2022   GFRAA >60 10/03/2019   CALCIUM 9.3 04/08/2022   PHOS 4.3 04/06/2022   PROT 8.3 (H) 04/05/2022   ALBUMIN 3.7 04/05/2022   BILITOT 0.7 04/05/2022   ALKPHOS 111 04/05/2022   AST 33 04/05/2022  ALT 48 (H) 04/05/2022   ANIONGAP 6 04/08/2022    GFR: Estimated Creatinine Clearance: 94.1 mL/min (by C-G formula based on SCr of 0.97 mg/dL).  Recent Results (from the past 240  hour(s))  Resp panel by RT-PCR (RSV, Flu A&B, Covid) Anterior Nasal Swab     Status: None   Collection Time: 04/05/22  1:50 PM   Specimen: Anterior Nasal Swab  Result Value Ref Range Status   SARS Coronavirus 2 by RT PCR NEGATIVE NEGATIVE Final    Comment: (NOTE) SARS-CoV-2 target nucleic acids are NOT DETECTED.  The SARS-CoV-2 RNA is generally detectable in upper respiratory specimens during the acute phase of infection. The lowest concentration of SARS-CoV-2 viral copies this assay can detect is 138 copies/mL. A negative result does not preclude SARS-Cov-2 infection and should not be used as the sole basis for treatment or other patient management decisions. A negative result may occur with  improper specimen collection/handling, submission of specimen other than nasopharyngeal swab, presence of viral mutation(s) within the areas targeted by this assay, and inadequate number of viral copies(<138 copies/mL). A negative result must be combined with clinical observations, patient history, and epidemiological information. The expected result is Negative.  Fact Sheet for Patients:  EntrepreneurPulse.com.au  Fact Sheet for Healthcare Providers:  IncredibleEmployment.be  This test is no t yet approved or cleared by the [place] FDA and  has been authorized for detection and/or diagnosis of SARS-CoV-2 by FDA under an Emergency Use Authorization (EUA). This EUA will remain  in effect (meaning this test can be used) for the duration of the COVID-19 declaration under Section 564(b)(1) of the Act, 21 U.S.C.section 360bbb-3(b)(1), unless the authorization is terminated  or revoked sooner.       Influenza A by PCR NEGATIVE NEGATIVE Final   Influenza B by PCR NEGATIVE NEGATIVE Final    Comment: (NOTE) The Xpert Xpress SARS-CoV-2/FLU/RSV plus assay is intended as an aid in the diagnosis of influenza from Nasopharyngeal swab specimens and should not  be used as a sole basis for treatment. Nasal washings and aspirates are unacceptable for Xpert Xpress SARS-CoV-2/FLU/RSV testing.  Fact Sheet for Patients: EntrepreneurPulse.com.au  Fact Sheet for Healthcare Providers: IncredibleEmployment.be  This test is not yet approved or cleared by the [place] FDA and has been authorized for detection and/or diagnosis of SARS-CoV-2 by FDA under an Emergency Use Authorization (EUA). This EUA will remain in effect (meaning this test can be used) for the duration of the COVID-19 declaration under Section 564(b)(1) of the Act, 21 U.S.C. section 360bbb-3(b)(1), unless the authorization is terminated or revoked.     Resp Syncytial Virus by PCR NEGATIVE NEGATIVE Final    Comment: (NOTE) Fact Sheet for Patients: EntrepreneurPulse.com.au  Fact Sheet for Healthcare Providers: IncredibleEmployment.be  This test is not yet approved or cleared by the [place] FDA and has been authorized for detection and/or diagnosis of SARS-CoV-2 by FDA under an Emergency Use Authorization (EUA). This EUA will remain in effect (meaning this test can be used) for the duration of the COVID-19 declaration under Section 564(b)(1) of the Act, 21 U.S.C. section 360bbb-3(b)(1), unless the authorization is terminated or revoked.  Performed at [facility], [place] [address] [place], [place] [address]   Urine Culture (for pregnant, neutropenic or urologic patients or patients with an indwelling urinary catheter)     Status: Abnormal   Collection Time: 04/05/22  5:14 PM   Specimen: Urine, Clean Catch  Result Value Ref Range Status  Specimen Description   Final    URINE, CLEAN CATCH Performed at [facility], [place] [address], [place], [place] [address]    Special Requests   Final    NONE Performed at [facility], [place] [address],  [place], [place] [address]    Culture MULTIPLE SPECIES PRESENT, SUGGEST RECOLLECTION (A)  Final   Report Status 04/07/2022 FINAL  Final      Radiology Studies: No results found.    LOS: 2 days    Cordelia Poche, MD [facility] 04/08/2022, 9:57 AM   If 7PM-7AM, please contact night-coverage [email or web address]

## 2022-04-09 LAB — COMPREHENSIVE METABOLIC PANEL
ALT: 22 U/L (ref 0–44)
AST: 24 U/L (ref 15–41)
Albumin: 2.9 g/dL — ABNORMAL LOW (ref 3.5–5.0)
Alkaline Phosphatase: 69 U/L (ref 38–126)
Anion gap: 7 (ref 5–15)
BUN: 15 mg/dL (ref 6–20)
CO2: 25 mmol/L (ref 22–32)
Calcium: 9.4 mg/dL (ref 8.9–10.3)
Chloride: 102 mmol/L (ref 98–111)
Creatinine, Ser: 1.04 mg/dL — ABNORMAL HIGH (ref 0.44–1.00)
GFR, Estimated: >60 mL/min (ref >60)
Glucose, Bld: 228 mg/dL — ABNORMAL HIGH (ref 70–99)
Potassium: 4.7 mmol/L (ref 3.5–5.1)
Sodium: 134 mmol/L — ABNORMAL LOW (ref 135–145)
Total Bilirubin: 0.7 mg/dL (ref 0.3–1.2)
Total Protein: 6.4 g/dL — ABNORMAL LOW (ref 6.5–8.1)

## 2022-04-09 LAB — GLUCOSE, CAPILLARY
Glucose-Capillary: 150 mg/dL — ABNORMAL HIGH (ref 70–99)
Glucose-Capillary: 181 mg/dL — ABNORMAL HIGH (ref 70–99)
Glucose-Capillary: 200 mg/dL — ABNORMAL HIGH (ref 70–99)
Glucose-Capillary: 227 mg/dL — ABNORMAL HIGH (ref 70–99)
Glucose-Capillary: 240 mg/dL — ABNORMAL HIGH (ref 70–99)

## 2022-04-09 LAB — CBC WITH DIFFERENTIAL/PLATELET
Abs Immature Granulocytes: 0.01 10*3/uL (ref 0.00–0.07)
Basophils Absolute: 0 10*3/uL (ref 0.0–0.1)
Basophils Relative: 1 %
Eosinophils Absolute: 0.2 10*3/uL (ref 0.0–0.5)
Eosinophils Relative: 3 %
HCT: 34 % — ABNORMAL LOW (ref 36.0–46.0)
Hemoglobin: 10.4 g/dL — ABNORMAL LOW (ref 12.0–15.0)
Immature Granulocytes: 0 %
Lymphocytes Relative: 47 %
Lymphs Abs: 2.6 10*3/uL (ref 0.7–4.0)
MCH: 28.5 pg (ref 26.0–34.0)
MCHC: 30.6 g/dL (ref 30.0–36.0)
MCV: 93.2 fL (ref 80.0–100.0)
Monocytes Absolute: 0.4 10*3/uL (ref 0.1–1.0)
Monocytes Relative: 8 %
Neutro Abs: 2.2 10*3/uL (ref 1.7–7.7)
Neutrophils Relative %: 41 %
Platelets: 326 10*3/uL (ref 150–400)
RBC: 3.65 MIL/uL — ABNORMAL LOW (ref 3.87–5.11)
RDW: 14 % (ref 11.5–15.5)
WBC: 5.5 10*3/uL (ref 4.0–10.5)
nRBC: 0 % (ref 0.0–0.2)

## 2022-04-09 LAB — MAGNESIUM: Magnesium: 1.7 mg/dL (ref 1.7–2.4)

## 2022-04-09 LAB — PHOSPHORUS: Phosphorus: 4.3 mg/dL (ref 2.5–4.6)

## 2022-04-09 NOTE — Plan of Care (Signed)

## 2022-04-09 NOTE — Inpatient Diabetes Management (Signed)
Inpatient Diabetes Program Recommendations  AACE/ADA: New Consensus Statement on Inpatient Glycemic Control (2015)  Target Ranges:  Prepandial:   less than 140 mg/dL      Peak postprandial:   less than 180 mg/dL (1-2 hours)      Critically ill patients:  140 - 180 mg/dL   Lab Results  Component Value Date   GLUCAP 227 (H) 04/09/2022   HGBA1C 11.2 (H) 03/23/2022    Review of Glycemic Control  Diabetes history: DM1 Outpatient Diabetes medications: Levemir 13 BID, Novolin R 0-15 TID  HgbA1C - 11.2%  Post-prandials above goal. Eating 100%.  Current orders for Inpatient glycemic control: Levemir 13 units BID, Novolog 0-9 units Q4H  Inpatient Diabetes Program Recommendations:    Consider adding Novolog 3 units TID with meals if eating > 50%  Continue to follow.  Thank you. Lorenda Peck, RD, LDN, [place] Inpatient Diabetes Coordinator [phone]

## 2022-04-09 NOTE — Discharge Summary (Signed)
Physician Discharge Summary   Patient: Kelli Hudson MRN: [id number] DOB: 1990-07-20  Admit date:     04/05/2022  Discharge date: 04/09/2022  Discharge Physician: Kerney Elbe   PCP: [name], MD   Recommendations at discharge:   Follow-up with PCP within 1 to 2 weeks and repeat CBC, CMP, mag, Phos within 1 week Follow-up with gastroenterology in outpatient setting within 1 to 2 weeks  Discharge Diagnoses: Principal Problem:   Gastroparesis Active Problems:   Hypertension associated with diabetes ([place])   DM type 1 (diabetes mellitus, type 1) ([place])   Essential hypertension   UTI (urinary tract infection)   Hematemesis   Diabetic gastroparesis ([place])  Resolved Problems:   * No resolved hospital problems. *  Hospital Course: Kelli Hudson is a 32 y.o. female with a history of diabetes mellitus type 1, gastroparesis, hypertension, CKD stage II, hyperlipidemia, peripheral neuropathy, GERD, anemia. Patient presented secondary to intractable nausea/vomiting with concern for gastroparesis flare. IV fluids started. Bowel rest. Advance as tolerated.  She subsequently was able to tolerate a diet and abdominal pain improved.  She had no more nausea or vomiting and was stable for discharge.  She will need to follow-up with PCP as well as her gastroenterologist in outpatient setting.  Assessment and Plan: No notes have been filed under this hospital service. Service: Hospitalist  Gastroparesis flare Patient follows with Dr. Havery Moros as an outpatient. Patient thinks she ate something that caused her flare. Patient also has very poorly controlled diabetes, which is likely contributing. Symptoms improved this morning, but patient developed recurrent emesis. -Continue diet for now and she tolerated without nausea vomiting and felt back to her baseline so she is medically stable for discharge -Continue antiemetics and analgesics PRN   AKI Baseline creatinine of  1-1.1. Creatinine of 1.37, up to 1.92 this morning. Possibly related to dehydration and/or relative hypotension.  -Resolved with IV fluids. -BUN/Cr Trend: Recent Labs  Lab 03/25/22 0543 04/05/22 1350 04/06/22 0051 04/07/22 0344 04/08/22 0418 04/09/22 0905 04/10/22 2250  BUN 18 27* 23* 32* '20 15 11  '$ CREATININE 1.09* 1.37* 1.39* 1.92* 0.97 1.04* 1.17*  -Avoid Nephrotoxic Medications, Contrast Dyes, Hypotension and Dehydration to Ensure Adequate Renal Perfusion and will need to Renally Adjust Meds -Continue to Monitor and Trend Renal Function carefully and repeat CMP within 1 week   Diabetes mellitus type 1 Uncontrolled with hyperglycemia. Hemoglobin A1C of 11.2% from two weeks prior. -Continue Levemir and SSI -CBGs ranged from 240-348 but she is stable for discharge and can go back home on her regimen   Hematemesis Secondary to recurrent emesis.  -Likely related to Mallory-Weiss tears. Appears to be resolved. -Hgb/Hct Trend: Recent Labs  Lab 03/22/22 0618 03/23/22 0651 03/24/22 0553 03/25/22 0543 04/05/22 1350 04/09/22 0905 04/10/22 2250  HGB 10.9* 10.0* 10.1* 9.9* 12.6 10.4* 11.9*  HCT 34.2* 31.5* 33.7* 31.9* 38.7 34.0* 37.0  MCV 89.1 91.0 93.9 90.9 87.8 93.2 89.6  -No active signs and symptoms of bleeding and can follow-up with her primary GI doctor and repeat CBC within 1 week  Primary hypertension -Continue Coreg   Hyperlipidemia -Continue Lipitor   Acute cystitis Noted on imaging. Urinalysis not very suggestive for urinary infection. Empiric Ceftriaxone started and urine culture significant for multiple species. Completed Ceftriaxone x3 days   Microscopic hematuria Noted on urinalysis. Possible infection. Will need outpatient follow-up/repeat urinalysis within 1 week  Hypoalbuminemia -Patient's Albumin Trend: Recent Labs  Lab 03/21/22 1605 03/23/22 0651 03/24/22 0553 03/25/22 0543 04/05/22 1350  04/09/22 0905 04/10/22 2250  ALBUMIN 3.6 2.5* 2.6* 2.9*  3.7 2.9* 3.3*  -Continue to Monitor and Trend and repeat CMP in the AM    Consultants: None Procedures performed: As delineated as above Disposition: Home Diet recommendation:  Cardiac and Carb modified diet DISCHARGE MEDICATION: Allergies as of 04/09/2022       Reactions   Lisinopril Cough   Breakout in rash        Medication List     STOP taking these medications    metoCLOPramide 5 MG tablet Commonly known as: Reglan       TAKE these medications    atorvastatin 20 MG tablet Commonly known as: LIPITOR Take 1 tablet (20 mg total) by mouth daily.   carvedilol 6.25 MG tablet Commonly known as: COREG Take 6.25 mg by mouth 2 (two) times daily.   Dexcom G6 Transmitter Misc Change every 90 days   gabapentin 300 MG capsule Commonly known as: NEURONTIN Take 1 capsule (300 mg total) by mouth 3 (three) times daily.   hydrOXYzine 25 MG capsule Commonly known as: VISTARIL Take 1 capsule (25 mg total) by mouth every 8 (eight) hours as needed. What changed: reasons to take this   insulin regular 100 units/mL injection Commonly known as: NovoLIN R Inject 0-0.15 mLs (0-15 Units total) into the skin See admin instructions. Inject 0-15 units into the skin three times a day with meals, per sliding scale   Levemir FlexTouch 100 UNIT/ML FlexPen Generic drug: insulin detemir Inject 13 Units into the skin 2 (two) times daily.   losartan 25 MG tablet Commonly known as: COZAAR Take 1 tablet (25 mg total) by mouth daily.   ondansetron 4 MG tablet Commonly known as: ZOFRAN Take 4 mg by mouth every 8 (eight) hours as needed for nausea or vomiting.   pantoprazole 40 MG tablet Commonly known as: PROTONIX Take 1 tablet (40 mg total) by mouth 2 (two) times daily before a meal.        Follow-up Information     Armbruster, Carlota Raspberry, MD Follow up.   Specialty: Gastroenterology Why: Followup within 1-2 weeks Contact information: [address] [place]  [address] [phone]                Discharge Exam: Filed Weights   04/06/22 2113 04/07/22 0905 04/07/22 1500  Weight: 80 kg 80 kg 80 kg   Vitals:   04/09/22 1003 04/09/22 1236  BP: (!) 144/96 133/85  Pulse: 98 (!) 109  Resp:  16  Temp:  98.7 F (37.1 C)  SpO2:  99%   Examination: Physical Exam:  Constitutional: WN/WD African-American female in no acute distress appears calm and back to her baseline Respiratory: Diminished to auscultation bilaterally, no wheezing, rales, rhonchi or crackles. Normal respiratory effort and patient is not tachypenic. No accessory muscle use.  Unlabored breathing Cardiovascular: RRR, no murmurs / rubs / gallops. S1 and S2 auscultated. No extremity edema Abdomen: Soft, non-tender, distended slightly secondary to body habitus.  Bowel sounds positive.  GU: Deferred. Musculoskeletal: No clubbing / cyanosis of digits/nails. No joint deformity upper and lower extremities.  Skin: No rashes, lesions, ulcers on limited skin evaluation. No induration; Warm and dry.  Neurologic: CN 2-12 grossly intact with no focal deficits. Romberg sign and cerebellar reflexes not assessed.  Psychiatric: Normal judgment and insight. Alert and oriented x 3. Normal mood and appropriate affect.   Condition at discharge: stable  The results of significant diagnostics from this  hospitalization (including imaging, microbiology, ancillary and laboratory) are listed below for reference.   Imaging Studies: CT Angio Chest PE W and/or Wo Contrast  Result Date: 04/05/2022 CLINICAL DATA:  Positive D-dimer EXAM: CT ANGIOGRAPHY CHEST WITH CONTRAST TECHNIQUE: Multidetector CT imaging of the chest was performed using the standard protocol during bolus administration of intravenous contrast. Multiplanar CT image reconstructions and MIPs were obtained to evaluate the vascular anatomy. RADIATION DOSE REDUCTION: This exam was performed according to the departmental dose-optimization program  which includes automated exposure control, adjustment of the mA and/or kV according to patient size and/or use of iterative reconstruction technique. CONTRAST:  12m OMNIPAQUE IOHEXOL 350 MG/ML SOLN COMPARISON:  Chest x-ray from earlier in the same day. FINDINGS: Cardiovascular: Thoracic aorta shows a normal branching pattern. No aneurysmal dilatation or dissection is seen. No cardiac enlargement is noted. The pulmonary artery shows a normal branching pattern bilaterally. No filling defect to suggest pulmonary embolism is noted. No coronary calcifications are seen. Mediastinum/Nodes: Thoracic inlet is within normal limits. No hilar or mediastinal adenopathy is noted. The esophagus is distended with air which may be related to reflux. Lungs/Pleura: Lungs are well aerated bilaterally. No focal infiltrate or sizable effusion is seen. No parenchymal nodules are seen. No pneumothorax is noted. Upper Abdomen: As described on earlier CT of the abdomen and pelvis. Musculoskeletal: No acute rib abnormality is noted. No compression deformity is seen. Review of the MIP images confirms the above findings. IMPRESSION: No evidence of pulmonary emboli. No acute abnormality noted. Electronically Signed   By: MInez CatalinaM.D.   On: 04/05/2022 17:04   CT ABDOMEN PELVIS W CONTRAST  Result Date: 04/05/2022 CLINICAL DATA:  RLQ abdominal pain EXAM: CT ABDOMEN AND PELVIS WITH CONTRAST TECHNIQUE: Multidetector CT imaging of the abdomen and pelvis was performed using the standard protocol following bolus administration of intravenous contrast. RADIATION DOSE REDUCTION: This exam was performed according to the departmental dose-optimization program which includes automated exposure control, adjustment of the mA and/or kV according to patient size and/or use of iterative reconstruction technique. CONTRAST:  1023mOMNIPAQUE IOHEXOL 300 MG/ML  SOLN COMPARISON:  CT AP, 03/22/2022 and 08/24/2021. FINDINGS: Lower chest: No acute abnormality.  Hepatobiliary: No focal liver abnormality is seen. No gallstones, gallbladder wall thickening, or biliary dilatation. Pancreas: No pancreatic ductal dilatation or surrounding inflammatory changes. Spleen: Normal in size without focal abnormality. Adrenals/Urinary Tract: Adrenal glands are unremarkable. Kidneys are normal, without renal calculi, focal lesion, or hydronephrosis. Small volume of dependent gas within the bladder, correlate for recent catheterization. Additional non dependent gas within the urinary bladder, with additional trace focus of gas within the Retzius space. Stomach/Bowel: Stomach is within normal limits. Appendix appears normal. No evidence of bowel wall thickening, distention, or inflammatory changes. Vascular/Lymphatic: No significant vascular findings are present. No enlarged abdominal or pelvic lymph nodes. Reproductive: Uterus and adnexa are unremarkable. Other: No abdominal wall hernia or abnormality. No abdominopelvic ascites. No intraperitoneal free air. Musculoskeletal: No acute or significant osseous findings. IMPRESSION: 1. Emphysematous cystitis. 2. Small volume dependent gas within the urinary bladder. Correlate for catheterization Electronically Signed   By: JoMichaelle Birks.D.   On: 04/05/2022 15:04   DG Chest Portable 1 View  Result Date: 04/05/2022 CLINICAL DATA:  chest pain EXAM: PORTABLE CHEST - 1 VIEW COMPARISON:  01/03/2022 FINDINGS: Cardiac silhouette is unremarkable. No pneumothorax or pleural effusion. The lungs are clear. The visualized skeletal structures are unremarkable. IMPRESSION: No acute cardiopulmonary process. Electronically Signed   By: JoVonna Kotyk  Pleasure M.D.   On: 04/05/2022 14:07   CT ABDOMEN PELVIS W CONTRAST  Result Date: 03/22/2022 CLINICAL DATA:  Abdominal pain, acute. Emesis. EXAM: CT ABDOMEN AND PELVIS WITH CONTRAST TECHNIQUE: Multidetector CT imaging of the abdomen and pelvis was performed using the standard protocol following bolus administration  of intravenous contrast. RADIATION DOSE REDUCTION: This exam was performed according to the departmental dose-optimization program which includes automated exposure control, adjustment of the mA and/or kV according to patient size and/or use of iterative reconstruction technique. CONTRAST:  16m OMNIPAQUE IOHEXOL 300 MG/ML  SOLN COMPARISON:  08/24/2021 FINDINGS: Lower chest: No acute abnormality. Hepatobiliary: No focal liver abnormality is seen. No gallstones, gallbladder wall thickening, or biliary dilatation. Pancreas: Unremarkable. No pancreatic ductal dilatation or surrounding inflammatory changes. Spleen: Normal in size without focal abnormality. Adrenals/Urinary Tract: Normal adrenal glands. No kidney stone, hydronephrosis, or mass. Urinary bladder is unremarkable. Stomach/Bowel: There is circumferential wall thickening involving the imaged portions of the distal esophagus. The stomach appears nondistended. The duodenum is normal. No small bowel wall thickening, inflammation, or distension. Similar to the past two exams the appendix is prominent measuring up to 8 mm. Gas is noted throughout the lumen of the appendix however and there is no periappendiceal fat stranding. Mild wall thickening involving the descending colon is noted without significant pericolonic inflammatory fat strain or free fluid. There is also mild wall thickening involving the rectum. Vascular/Lymphatic: No significant vascular findings are present. No enlarged abdominal or pelvic lymph nodes. Reproductive: Uterus and bilateral adnexa are unremarkable. Other: No free fluid or fluid collections. No signs of pneumoperitoneum. Musculoskeletal: No acute or significant osseous findings. IMPRESSION: 1. There is circumferential wall thickening involving the imaged portions of the distal esophagus. Correlate for any clinical signs or symptoms of esophagitis and gastritis. 2. Mild wall thickening involving the descending colon and rectum. No  signs of pericolonic fat stranding or free fluid. Findings may reflect incomplete distention or mild colitis. 3. The appendix is prominent measuring up to 8 mm. Gas is noted throughout the lumen of the appendix however and there is no periappendiceal fat stranding to suggest acute appendicitis. This likely represents a normal anatomic variant. Electronically Signed   By: TKerby MoorsM.D.   On: 03/22/2022 12:20    Microbiology: Results for orders placed or performed during the hospital encounter of 04/05/22  Resp panel by RT-PCR (RSV, Flu A&B, Covid) Anterior Nasal Swab     Status: None   Collection Time: 04/05/22  1:50 PM   Specimen: Anterior Nasal Swab  Result Value Ref Range Status   SARS Coronavirus 2 by RT PCR NEGATIVE NEGATIVE Final    Comment: (NOTE) SARS-CoV-2 target nucleic acids are NOT DETECTED.  The SARS-CoV-2 RNA is generally detectable in upper respiratory specimens during the acute phase of infection. The lowest concentration of SARS-CoV-2 viral copies this assay can detect is 138 copies/mL. A negative result does not preclude SARS-Cov-2 infection and should not be used as the sole basis for treatment or other patient management decisions. A negative result may occur with  improper specimen collection/handling, submission of specimen other than nasopharyngeal swab, presence of viral mutation(s) within the areas targeted by this assay, and inadequate number of viral copies(<138 copies/mL). A negative result must be combined with clinical observations, patient history, and epidemiological information. The expected result is Negative.  Fact Sheet for Patients:  hEntrepreneurPulse.com.au Fact Sheet for Healthcare Providers:  hIncredibleEmployment.be This test is no t yet approved or cleared by the  [place] Architectural technologist and  has been authorized for detection and/or diagnosis of SARS-CoV-2 by FDA under an Print production planner (EUA).  This EUA will remain  in effect (meaning this test can be used) for the duration of the COVID-19 declaration under Section 564(b)(1) of the Act, 21 U.S.C.section 360bbb-3(b)(1), unless the authorization is terminated  or revoked sooner.       Influenza A by PCR NEGATIVE NEGATIVE Final   Influenza B by PCR NEGATIVE NEGATIVE Final    Comment: (NOTE) The Xpert Xpress SARS-CoV-2/FLU/RSV plus assay is intended as an aid in the diagnosis of influenza from Nasopharyngeal swab specimens and should not be used as a sole basis for treatment. Nasal washings and aspirates are unacceptable for Xpert Xpress SARS-CoV-2/FLU/RSV testing.  Fact Sheet for Patients: EntrepreneurPulse.com.au  Fact Sheet for Healthcare Providers: IncredibleEmployment.be  This test is not yet approved or cleared by the [place] FDA and has been authorized for detection and/or diagnosis of SARS-CoV-2 by FDA under an Emergency Use Authorization (EUA). This EUA will remain in effect (meaning this test can be used) for the duration of the COVID-19 declaration under Section 564(b)(1) of the Act, 21 U.S.C. section 360bbb-3(b)(1), unless the authorization is terminated or revoked.     Resp Syncytial Virus by PCR NEGATIVE NEGATIVE Final    Comment: (NOTE) Fact Sheet for Patients: EntrepreneurPulse.com.au  Fact Sheet for Healthcare Providers: IncredibleEmployment.be  This test is not yet approved or cleared by the [place] FDA and has been authorized for detection and/or diagnosis of SARS-CoV-2 by FDA under an Emergency Use Authorization (EUA). This EUA will remain in effect (meaning this test can be used) for the duration of the COVID-19 declaration under Section 564(b)(1) of the Act, 21 U.S.C. section 360bbb-3(b)(1), unless the authorization is terminated or revoked.  Performed at [facility], [place] [address] [place], [place] [address]   Urine Culture (for pregnant, neutropenic or urologic patients or patients with an indwelling urinary catheter)     Status: Abnormal   Collection Time: 04/05/22  5:14 PM   Specimen: Urine, Clean Catch  Result Value Ref Range Status   Specimen Description   Final    URINE, CLEAN CATCH Performed at [facility], [place] [address], [place], [place] [address]    Special Requests   Final    NONE Performed at [facility], [place] [address], [place], [place] [address]    Culture MULTIPLE SPECIES PRESENT, SUGGEST RECOLLECTION (A)  Final   Report Status 04/07/2022 FINAL  Final   Labs: CBC: Recent Labs  Lab 04/05/22 1350 04/09/22 0905 04/10/22 2250  WBC 10.8* 5.5 7.6  NEUTROABS 8.2* 2.2 4.4  HGB 12.6 10.4* 11.9*  HCT 38.7 34.0* 37.0  MCV 87.8 93.2 89.6  PLT 474* 326 [id number]   Basic Metabolic Panel: Recent Labs  Lab 04/06/22 0051 04/07/22 0344 04/08/22 0418 04/09/22 0905 04/10/22 2250  NA 136 133* 133* 134* 135  K 4.1 4.4 5.0 4.7 4.1  CL 98 102 105 102 97*  CO2 '22 24 22 25 25  '$ GLUCOSE 270* 120* 201* 228* 362*  BUN 23* 32* '20 15 11  '$ CREATININE 1.39* 1.92* 0.97 1.04* 1.17*  CALCIUM 9.9 8.9 9.3 9.4 9.6  MG 1.9  --   --  1.7  --   PHOS 4.3  --   --  4.3  --    Liver Function Tests: Recent Labs  Lab 04/05/22 1350 04/09/22 0905 04/10/22 2250  AST 33  24 27  ALT 48* 22 25  ALKPHOS 111 69 77  BILITOT 0.7 0.7 0.9  PROT 8.3* 6.4* 6.9  ALBUMIN 3.7 2.9* 3.3*   CBG: Recent Labs  Lab 04/09/22 0429 04/09/22 0725 04/09/22 0856 04/09/22 1137 04/10/22 2209  GLUCAP 200* 150* 227* 240* 348*   Discharge time spent: greater than 30 minutes.  Signed: Raiford Noble, DO [facility] 04/11/2022

## 2022-04-09 NOTE — Progress Notes (Signed)
All personal belongings reviewed with patient, all questions answered.  AVS reviewed.   Boyfriend is picking her up.  She verbalizes understanding regarding the need for f/u app with GI and PCP.

## 2022-04-10 ENCOUNTER — Other Ambulatory Visit: Payer: Self-pay

## 2022-04-10 ENCOUNTER — Telehealth: Payer: Self-pay

## 2022-04-10 ENCOUNTER — Emergency Department (HOSPITAL_COMMUNITY)
Admission: EM | Admit: 2022-04-10 | Discharge: 2022-04-11 | Disposition: A | Discharge status: Home / Self Care | Payer: Medicaid Other | Attending: Emergency Medicine | Admitting: Emergency Medicine

## 2022-04-10 DIAGNOSIS — R000 Tachycardia, unspecified: Secondary | ICD-10-CM | POA: Diagnosis not present

## 2022-04-10 DIAGNOSIS — E1065 Type 1 diabetes mellitus with hyperglycemia: Secondary | ICD-10-CM | POA: Diagnosis not present

## 2022-04-10 DIAGNOSIS — Z79899 Other long term (current) drug therapy: Secondary | ICD-10-CM | POA: Insufficient documentation

## 2022-04-10 DIAGNOSIS — I10 Essential (primary) hypertension: Secondary | ICD-10-CM | POA: Insufficient documentation

## 2022-04-10 DIAGNOSIS — E039 Hypothyroidism, unspecified: Secondary | ICD-10-CM | POA: Diagnosis not present

## 2022-04-10 DIAGNOSIS — E1043 Type 1 diabetes mellitus with diabetic autonomic (poly)neuropathy: Secondary | ICD-10-CM | POA: Diagnosis not present

## 2022-04-10 DIAGNOSIS — Z1152 Encounter for screening for COVID-19: Secondary | ICD-10-CM | POA: Insufficient documentation

## 2022-04-10 DIAGNOSIS — K3184 Gastroparesis: Secondary | ICD-10-CM | POA: Diagnosis not present

## 2022-04-10 DIAGNOSIS — Z794 Long term (current) use of insulin: Secondary | ICD-10-CM | POA: Diagnosis not present

## 2022-04-10 DIAGNOSIS — R109 Unspecified abdominal pain: Secondary | ICD-10-CM | POA: Diagnosis present

## 2022-04-10 LAB — CBC WITH DIFFERENTIAL/PLATELET
Abs Immature Granulocytes: 0.02 10*3/uL (ref 0.00–0.07)
Basophils Absolute: 0 10*3/uL (ref 0.0–0.1)
Basophils Relative: 1 %
Eosinophils Absolute: 0.1 10*3/uL (ref 0.0–0.5)
Eosinophils Relative: 2 %
HCT: 37 % (ref 36.0–46.0)
Hemoglobin: 11.9 g/dL — ABNORMAL LOW (ref 12.0–15.0)
Immature Granulocytes: 0 %
Lymphocytes Relative: 33 %
Lymphs Abs: 2.5 10*3/uL (ref 0.7–4.0)
MCH: 28.8 pg (ref 26.0–34.0)
MCHC: 32.2 g/dL (ref 30.0–36.0)
MCV: 89.6 fL (ref 80.0–100.0)
Monocytes Absolute: 0.5 10*3/uL (ref 0.1–1.0)
Monocytes Relative: 6 %
Neutro Abs: 4.4 10*3/uL (ref 1.7–7.7)
Neutrophils Relative %: 58 %
Platelets: 390 10*3/uL (ref 150–400)
RBC: 4.13 MIL/uL (ref 3.87–5.11)
RDW: 13.6 % (ref 11.5–15.5)
WBC: 7.6 10*3/uL (ref 4.0–10.5)
nRBC: 0 % (ref 0.0–0.2)

## 2022-04-10 LAB — COMPREHENSIVE METABOLIC PANEL
ALT: 25 U/L (ref 0–44)
AST: 27 U/L (ref 15–41)
Albumin: 3.3 g/dL — ABNORMAL LOW (ref 3.5–5.0)
Alkaline Phosphatase: 77 U/L (ref 38–126)
Anion gap: 13 (ref 5–15)
BUN: 11 mg/dL (ref 6–20)
CO2: 25 mmol/L (ref 22–32)
Calcium: 9.6 mg/dL (ref 8.9–10.3)
Chloride: 97 mmol/L — ABNORMAL LOW (ref 98–111)
Creatinine, Ser: 1.17 mg/dL — ABNORMAL HIGH (ref 0.44–1.00)
GFR, Estimated: >60 mL/min (ref >60)
Glucose, Bld: 362 mg/dL — ABNORMAL HIGH (ref 70–99)
Potassium: 4.1 mmol/L (ref 3.5–5.1)
Sodium: 135 mmol/L (ref 135–145)
Total Bilirubin: 0.9 mg/dL (ref 0.3–1.2)
Total Protein: 6.9 g/dL (ref 6.5–8.1)

## 2022-04-10 LAB — RESP PANEL BY RT-PCR (RSV, FLU A&B, COVID)  RVPGX2
Influenza A by PCR: NEGATIVE
Influenza B by PCR: NEGATIVE
Resp Syncytial Virus by PCR: NEGATIVE
SARS Coronavirus 2 by RT PCR: NEGATIVE

## 2022-04-10 LAB — RAPID URINE DRUG SCREEN, HOSP PERFORMED
Amphetamines: NOT DETECTED
Barbiturates: NOT DETECTED
Benzodiazepines: NOT DETECTED
Cocaine: NOT DETECTED
Opiates: NOT DETECTED
Tetrahydrocannabinol: NOT DETECTED

## 2022-04-10 LAB — URINALYSIS, ROUTINE W REFLEX MICROSCOPIC
Bacteria, UA: NONE SEEN
Bilirubin Urine: NEGATIVE
Glucose, UA: >=500 mg/dL — AB
Hgb urine dipstick: NEGATIVE
Ketones, ur: NEGATIVE mg/dL
Leukocytes,Ua: NEGATIVE
Nitrite: NEGATIVE
Protein, ur: >=300 mg/dL — AB
Specific Gravity, Urine: 1.016 (ref 1.005–1.030)
pH: 7 (ref 5.0–8.0)

## 2022-04-10 LAB — I-STAT BETA HCG BLOOD, ED (MC, WL, AP ONLY): I-stat hCG, quantitative: <5 m[IU]/mL (ref <5)

## 2022-04-10 LAB — LIPASE, BLOOD: Lipase: 32 U/L (ref 11–51)

## 2022-04-10 LAB — CBG MONITORING, ED: Glucose-Capillary: 348 mg/dL — ABNORMAL HIGH (ref 70–99)

## 2022-04-10 MED ORDER — HYDROMORPHONE HCL 1 MG/ML IJ SOLN
1.0000 mg | Freq: Once | INTRAMUSCULAR | Status: AC
  Administered 2022-04-10: 1 mg via INTRAVENOUS
  Filled 2022-04-10: qty 1

## 2022-04-10 MED ORDER — SODIUM CHLORIDE 0.9 % IV BOLUS
1000.0000 mL | Freq: Once | INTRAVENOUS | Status: AC
  Administered 2022-04-10: 1000 mL via INTRAVENOUS

## 2022-04-10 MED ORDER — SODIUM CHLORIDE 0.9 % IV BOLUS
1000.0000 mL | Freq: Once | INTRAVENOUS | Status: AC
  Administered 2022-04-11: 1000 mL via INTRAVENOUS

## 2022-04-10 NOTE — ED Provider Notes (Signed)
[place] AT [facility] Provider Note   CSN: [id number] Arrival date & time: 04/10/22  2154     History  Chief Complaint  Patient presents with   Abdominal Pain    Kelli Hudson is a 32 y.o. female.  32 year old female with past medical history of type 1 diabetes with gastroparesis, hypertension, hypothyroid, GERD, QT prolongation presents with complaint of vomiting and gastroparesis with complaint of abdominal pain.  Symptoms similar to prior flares without any acute changes.  States that they are having chills and feeling hot.  No known sick contacts.  Just discharged from the hospital yesterday for same.       Home Medications Prior to Admission medications   Medication Sig Start Date End Date Taking? Authorizing Provider  atorvastatin (LIPITOR) 20 MG tablet Take 1 tablet (20 mg total) by mouth daily. 10/29/21   [name], MD  carvedilol (COREG) 6.25 MG tablet Take 6.25 mg by mouth 2 (two) times daily. 03/22/22   [provider]  Continuous Blood Gluc Transmit (DEXCOM G6 TRANSMITTER) MISC Change every 90 days 11/29/21   Shamleffer, Melanie Crazier, MD  gabapentin (NEURONTIN) 300 MG capsule Take 1 capsule (300 mg total) by mouth 3 (three) times daily. 12/30/21 04/07/22  Barb Merino, MD  hydrOXYzine (VISTARIL) 25 MG capsule Take 1 capsule (25 mg total) by mouth every 8 (eight) hours as needed. Patient taking differently: Take 25 mg by mouth every 8 (eight) hours as needed for anxiety. 07/31/21   Georgette Shell, MD  insulin detemir (LEVEMIR FLEXTOUCH) 100 UNIT/ML FlexPen Inject 13 Units into the skin 2 (two) times daily. 03/25/22   Mercy Riding, MD  insulin regular (NOVOLIN R) 100 units/mL injection Inject 0-0.15 mLs (0-15 Units total) into the skin See admin instructions. Inject 0-15 units into the skin three times a day with meals, per sliding scale 03/06/22   Shelly Coss, MD  losartan (COZAAR) 25 MG tablet Take 1  tablet (25 mg total) by mouth daily. 03/06/22   Shelly Coss, MD  ondansetron (ZOFRAN) 4 MG tablet Take 4 mg by mouth every 8 (eight) hours as needed for nausea or vomiting.    [provider]  pantoprazole (PROTONIX) 40 MG tablet Take 1 tablet (40 mg total) by mouth 2 (two) times daily before a meal. 11/08/21   Hosie Poisson, MD  insulin aspart (NOVOLOG) 100 UNIT/ML FlexPen Inject 5 Units into the skin 3 (three) times daily with meals. 02/23/18 07/05/19  Donne Hazel, MD      Allergies    Lisinopril    Review of Systems   Review of Systems Negative except as per HPI Physical Exam Updated Vital Signs BP 122/83   Pulse (!) 110   Temp 98.7 F (37.1 C) (Oral)   Resp (!) 23   Ht '5\' 8"'$  (1.727 m)   Wt 80 kg   SpO2 95%   BMI 26.82 kg/m  Physical Exam Vitals and nursing note reviewed.  Constitutional:      General: She is not in acute distress.    Appearance: She is well-developed. She is not diaphoretic.  HENT:     Head: Normocephalic and atraumatic.  Cardiovascular:     Rate and Rhythm: Regular rhythm. Tachycardia present.     Heart sounds: Normal heart sounds.  Pulmonary:     Effort: Pulmonary effort is normal.     Breath sounds: Normal breath sounds.  Abdominal:     Palpations: Abdomen is soft.  Tenderness: There is generalized abdominal tenderness.  Skin:    General: Skin is warm and dry.  Neurological:     Mental Status: She is alert and oriented to person, place, and time.  Psychiatric:        Behavior: Behavior normal.     ED Results / Procedures / Treatments   Labs (all labs ordered are listed, but only abnormal results are displayed) Labs Reviewed  CBC WITH DIFFERENTIAL/PLATELET - Abnormal; Notable for the following components:      Result Value   Hemoglobin 11.9 (*)    All other components within normal limits  COMPREHENSIVE METABOLIC PANEL - Abnormal; Notable for the following components:   Chloride 97 (*)    Glucose, Bld 362 (*)     Creatinine, Ser 1.17 (*)    Albumin 3.3 (*)    All other components within normal limits  URINALYSIS, ROUTINE W REFLEX MICROSCOPIC - Abnormal; Notable for the following components:   Color, Urine STRAW (*)    Glucose, UA >=500 (*)    Protein, ur >=300 (*)    All other components within normal limits  BLOOD GAS, VENOUS - Abnormal; Notable for the following components:   pH, Ven 7.49 (*)    pCO2, Ven 40 (*)    pO2, Ven <31 (*)    Bicarbonate 30.5 (*)    Acid-Base Excess 6.6 (*)    All other components within normal limits  CBG MONITORING, ED - Abnormal; Notable for the following components:   Glucose-Capillary 348 (*)    All other components within normal limits  RESP PANEL BY RT-PCR (RSV, FLU A&B, COVID)  RVPGX2  LIPASE, BLOOD  RAPID URINE DRUG SCREEN, HOSP PERFORMED  I-STAT BETA HCG BLOOD, ED ([facility], [facility], [facility] ONLY)    EKG EKG Interpretation  Date/Time:  Thursday April 10 2022 23:28:59 EST Ventricular Rate:  121 PR Interval:  172 QRS Duration: 81 QT Interval:  303 QTC Calculation: 430 R Axis:   64 Text Interpretation: Sinus tachycardia Consider right atrial enlargement Borderline T wave abnormalities Confirmed by Thayer Jew [phone]) on 04/11/2022 12:52:53 AM  Radiology No results found.  Procedures Procedures    Medications Ordered in ED Medications  sodium chloride 0.9 % bolus 1,000 mL (0 mLs Intravenous Stopped 04/11/22 0115)  HYDROmorphone (DILAUDID) injection 1 mg (1 mg Intravenous Given 04/10/22 2339)  sodium chloride 0.9 % bolus 1,000 mL (1,000 mLs Intravenous New Bag/Given 04/11/22 0015)  HYDROcodone-acetaminophen (NORCO/VICODIN) 5-325 MG per tablet 1 tablet (1 tablet Oral Given 04/11/22 0128)    ED Course/ Medical Decision Making/ A&P                             Medical Decision Making Amount and/or Complexity of Data Reviewed Labs: ordered.  Risk Prescription drug management.   This patient presents to the ED for concern of vomiting and abdominal  pain, this involves an extensive number of treatment options, and is a complaint that carries with it a high risk of complications and morbidity.  The differential diagnosis includes but not limited to DKA, gastroparesis, dehydration, metabolic derangement   Co morbidities that complicate the patient evaluation  Gastroparesis, DKA, hypertension, GERD, QT prolongation   Additional history obtained:  External records from outside source obtained and reviewed including recent hospitalization from April 05, 2022 to April 09, 2022 for gastroparesis   Lab Tests:  I Ordered, and personally interpreted labs.  The pertinent results include: Respiratory panel  needed for COVID/flu/RSV.  CMP with elevated glucose at 362, creatinine 1.17, normal bicarb and anion gap.  UDS negative.  Urinalysis positive for glucose and protein, negative for ketones.  hCG negative.  VBG with pH of 7.49.  Lipase within normal is.  CBC with normal WBC.   Cardiac Monitoring: / EKG:  The patient was maintained on a cardiac monitor.  I personally viewed and interpreted the cardiac monitored which showed an underlying rhythm of: Sinus tachycardia, rate 121   Consultations Obtained:  I requested consultation with the ER attending, Dr. Dina Rich,  and discussed lab and imaging findings as well as pertinent plan - they recommend: Not in DKA, agrees with plan of care   Problem List / ED Course / Critical interventions / Medication management  32 year old female with history of diabetic gastroparesis presents with vomiting and abdominal pain, similar to prior episodes, discharged the hospital for same yesterday.  On exam, appeared very uncomfortable, generalized abdominal discomfort, no active vomiting.  Provided with IV fluids and pain medications, vitals improved, symptoms improved, tolerating p.o. fluids and p.o. pain medications.  Labs with hyperglycemia, no evidence of DKA or significant abnormality.  Discharged to  follow-up with specialty care team with return to ER precautions. I ordered medication including Norco, Dilaudid for vomiting, pain Reevaluation of the patient after these medicines showed that the patient resolved I have reviewed the patients home medicines and have made adjustments as needed   Social Determinants of Health:  Has PCP and GI specialist   Test / Admission - Considered:  Labs without evidence of DKA or significant abnormality.  Vital signs improved, patient is tolerating p.o.'s with improvement in symptoms         Final Clinical Impression(s) / ED Diagnoses Final diagnoses:  Gastroparesis    Rx / DC Orders ED Discharge Orders     None         Tacy Learn, PA-C 04/11/22 0319    Merryl Hacker, MD 04/12/22 [phone]

## 2022-04-10 NOTE — ED Triage Notes (Signed)
Pt c/o generalized abdominal pain, chills, and NV that started earlier this morning. Emesis x10. S/s similar to gastroparesis flare ups in the past. HX T1DM

## 2022-04-10 NOTE — ED Provider Triage Note (Signed)
Emergency Medicine Provider Triage Evaluation Note  Kelli Hudson , a 32 y.o. female  was evaluated in triage.  Pt complains of abd pain. Endorse abd pain with nausea, vomiting, chills.  Was discharged from the hospital yesterday after having sxs similar, thought to be related to gastroparesis.  Denies alcohol or tobacco use.    Review of Systems  Positive: As above Negative: As above  Physical Exam  BP (!) 134/99   Pulse (!) 124   Temp 100 F (37.8 C) (Oral)   Resp (!) 24   SpO2 99%  Gen:   Awake, no distress   Resp:  Normal effort  MSK:   Moves extremities without difficulty  Other:    Medical Decision Making  Medically screening exam initiated at 10:33 PM.  Appropriate orders placed.  Kelli Hudson was informed that the remainder of the evaluation will be completed by another provider, this initial triage assessment does not replace that evaluation, and the importance of remaining in the ED until their evaluation is complete.     Domenic Moras, PA-C 04/10/22 2234

## 2022-04-10 NOTE — Telephone Encounter (Signed)
Dismissed from practice.[name] opened in error

## 2022-04-10 NOTE — ED Provider Notes (Incomplete)
[place] AT [facility] Provider Note   CSN: [id number] Arrival date & time: 04/10/22  2154     History {Add pertinent medical, surgical, social history, OB history to HPI:1} Chief Complaint  Patient presents with  . Abdominal Pain    Kelli Hudson is a 32 y.o. female.  32 year old female with past medical history of type 1 diabetes with gastroparesis, hypertension, hypothyroid, GERD, QT prolongation presents with complaint of vomiting and gastroparesis with complaint of abdominal pain.  Symptoms similar to prior flares without any acute changes.  States that they are having chills and feeling hot.  No known sick contacts.  Just discharged from the hospital yesterday for same.       Home Medications Prior to Admission medications   Medication Sig Start Date End Date Taking? Authorizing Provider  atorvastatin (LIPITOR) 20 MG tablet Take 1 tablet (20 mg total) by mouth daily. 10/29/21   [name], MD  carvedilol (COREG) 6.25 MG tablet Take 6.25 mg by mouth 2 (two) times daily. 03/22/22   [provider]  Continuous Blood Gluc Transmit (DEXCOM G6 TRANSMITTER) MISC Change every 90 days 11/29/21   Shamleffer, Melanie Crazier, MD  gabapentin (NEURONTIN) 300 MG capsule Take 1 capsule (300 mg total) by mouth 3 (three) times daily. 12/30/21 04/07/22  Barb Merino, MD  hydrOXYzine (VISTARIL) 25 MG capsule Take 1 capsule (25 mg total) by mouth every 8 (eight) hours as needed. Patient taking differently: Take 25 mg by mouth every 8 (eight) hours as needed for anxiety. 07/31/21   Georgette Shell, MD  insulin detemir (LEVEMIR FLEXTOUCH) 100 UNIT/ML FlexPen Inject 13 Units into the skin 2 (two) times daily. 03/25/22   Mercy Riding, MD  insulin regular (NOVOLIN R) 100 units/mL injection Inject 0-0.15 mLs (0-15 Units total) into the skin See admin instructions. Inject 0-15 units into the skin three times a day with meals, per sliding scale  03/06/22   Shelly Coss, MD  losartan (COZAAR) 25 MG tablet Take 1 tablet (25 mg total) by mouth daily. 03/06/22   Shelly Coss, MD  ondansetron (ZOFRAN) 4 MG tablet Take 4 mg by mouth every 8 (eight) hours as needed for nausea or vomiting.    [provider]  pantoprazole (PROTONIX) 40 MG tablet Take 1 tablet (40 mg total) by mouth 2 (two) times daily before a meal. 11/08/21   Hosie Poisson, MD  insulin aspart (NOVOLOG) 100 UNIT/ML FlexPen Inject 5 Units into the skin 3 (three) times daily with meals. 02/23/18 07/05/19  Donne Hazel, MD      Allergies    Lisinopril    Review of Systems   Review of Systems Negative except as per HPI Physical Exam Updated Vital Signs BP (!) 134/99   Pulse (!) 124   Temp 100 F (37.8 C) (Oral)   Resp (!) 24   SpO2 99%  Physical Exam  ED Results / Procedures / Treatments   Labs (all labs ordered are listed, but only abnormal results are displayed) Labs Reviewed  CBG MONITORING, ED - Abnormal; Notable for the following components:      Result Value   Glucose-Capillary 348 (*)    All other components within normal limits  RESP PANEL BY RT-PCR (RSV, FLU A&B, COVID)  RVPGX2  CBC WITH DIFFERENTIAL/PLATELET  COMPREHENSIVE METABOLIC PANEL  LIPASE, BLOOD  URINALYSIS, ROUTINE W REFLEX MICROSCOPIC  RAPID URINE DRUG SCREEN, HOSP PERFORMED  I-STAT BETA HCG BLOOD, ED ([facility], [facility], [facility] ONLY)  EKG None  Radiology No results found.  Procedures Procedures  {Document cardiac monitor, telemetry assessment procedure when appropriate:1}  Medications Ordered in ED Medications - No data to display  ED Course/ Medical Decision Making/ A&P   {   Click here for ABCD2, HEART and other calculatorsREFRESH Note before signing :1}                          Medical Decision Making  ***  {Document critical care time when appropriate:1} {Document review of labs and clinical decision tools ie heart score, Chads2Vasc2 etc:1}  {Document your  independent review of radiology images, and any outside records:1} {Document your discussion with family members, caretakers, and with consultants:1} {Document social determinants of health affecting pt's care:1} {Document your decision making why or why not admission, treatments were needed:1} Final Clinical Impression(s) / ED Diagnoses Final diagnoses:  None    Rx / DC Orders ED Discharge Orders     None

## 2022-04-11 ENCOUNTER — Encounter (HOSPITAL_COMMUNITY): Payer: Self-pay

## 2022-04-11 LAB — BLOOD GAS, VENOUS
Acid-Base Excess: 6.6 mmol/L — ABNORMAL HIGH (ref 0.0–2.0)
Bicarbonate: 30.5 mmol/L — ABNORMAL HIGH (ref 20.0–28.0)
O2 Saturation: 30.7 %
Patient temperature: 37
pCO2, Ven: 40 mmHg — ABNORMAL LOW (ref 44–60)
pH, Ven: 7.49 — ABNORMAL HIGH (ref 7.25–7.43)
pO2, Ven: <31 mmHg — CL (ref 32–45)

## 2022-04-11 MED ORDER — HYDROCODONE-ACETAMINOPHEN 5-325 MG PO TABS
1.0000 | ORAL_TABLET | Freq: Once | ORAL | Status: AC
  Administered 2022-04-11: 1 via ORAL
  Filled 2022-04-11: qty 1

## 2022-04-11 NOTE — ED Notes (Signed)
Patient given discharge instructions and follow up care. Patient verbalized understanding. IV removed with catheter intact. Patient ambulatory out of ED.

## 2022-04-11 NOTE — Discharge Instructions (Signed)
Follow-up with your GI and PCP.  Return to ER for worsening or concerning symptoms.  Continue with Protonix as prescribed.

## 2022-04-16 ENCOUNTER — Other Ambulatory Visit: Payer: Self-pay

## 2022-04-16 ENCOUNTER — Emergency Department (HOSPITAL_COMMUNITY)
Admission: EM | Admit: 2022-04-16 | Discharge: 2022-04-16 | Disposition: A | Discharge status: Home / Self Care | Payer: Medicaid Other | Attending: Emergency Medicine | Admitting: Emergency Medicine

## 2022-04-16 ENCOUNTER — Encounter (HOSPITAL_COMMUNITY): Payer: Self-pay

## 2022-04-16 DIAGNOSIS — R1084 Generalized abdominal pain: Secondary | ICD-10-CM | POA: Diagnosis not present

## 2022-04-16 DIAGNOSIS — R1013 Epigastric pain: Secondary | ICD-10-CM | POA: Diagnosis not present

## 2022-04-16 DIAGNOSIS — R112 Nausea with vomiting, unspecified: Secondary | ICD-10-CM | POA: Diagnosis not present

## 2022-04-16 DIAGNOSIS — R109 Unspecified abdominal pain: Secondary | ICD-10-CM | POA: Diagnosis present

## 2022-04-16 LAB — COMPREHENSIVE METABOLIC PANEL
ALT: 22 U/L (ref 0–44)
AST: 22 U/L (ref 15–41)
Albumin: 3.6 g/dL (ref 3.5–5.0)
Alkaline Phosphatase: 92 U/L (ref 38–126)
Anion gap: 8 (ref 5–15)
BUN: 20 mg/dL (ref 6–20)
CO2: 27 mmol/L (ref 22–32)
Calcium: 9.3 mg/dL (ref 8.9–10.3)
Chloride: 99 mmol/L (ref 98–111)
Creatinine, Ser: 1.34 mg/dL — ABNORMAL HIGH (ref 0.44–1.00)
GFR, Estimated: 55 mL/min — ABNORMAL LOW (ref >60)
Glucose, Bld: 423 mg/dL — ABNORMAL HIGH (ref 70–99)
Potassium: 4.2 mmol/L (ref 3.5–5.1)
Sodium: 134 mmol/L — ABNORMAL LOW (ref 135–145)
Total Bilirubin: 0.9 mg/dL (ref 0.3–1.2)
Total Protein: 7.3 g/dL (ref 6.5–8.1)

## 2022-04-16 LAB — CBC
HCT: 35.2 % — ABNORMAL LOW (ref 36.0–46.0)
Hemoglobin: 11.4 g/dL — ABNORMAL LOW (ref 12.0–15.0)
MCH: 28.8 pg (ref 26.0–34.0)
MCHC: 32.4 g/dL (ref 30.0–36.0)
MCV: 88.9 fL (ref 80.0–100.0)
Platelets: 398 10*3/uL (ref 150–400)
RBC: 3.96 MIL/uL (ref 3.87–5.11)
RDW: 13.5 % (ref 11.5–15.5)
WBC: 8.3 10*3/uL (ref 4.0–10.5)
nRBC: 0 % (ref 0.0–0.2)

## 2022-04-16 LAB — I-STAT BETA HCG BLOOD, ED (MC, WL, AP ONLY): I-stat hCG, quantitative: <5 m[IU]/mL (ref <5)

## 2022-04-16 LAB — LIPASE, BLOOD: Lipase: 42 U/L (ref 11–51)

## 2022-04-16 LAB — CBG MONITORING, ED: Glucose-Capillary: 417 mg/dL — ABNORMAL HIGH (ref 70–99)

## 2022-04-16 MED ORDER — LACTATED RINGERS IV BOLUS
1000.0000 mL | Freq: Once | INTRAVENOUS | Status: AC
  Administered 2022-04-16: 1000 mL via INTRAVENOUS

## 2022-04-16 MED ORDER — DROPERIDOL 2.5 MG/ML IJ SOLN
2.5000 mg | Freq: Once | INTRAMUSCULAR | Status: AC
  Administered 2022-04-16: 2.5 mg via INTRAVENOUS
  Filled 2022-04-16: qty 2

## 2022-04-16 MED ORDER — PROMETHAZINE HCL 25 MG RE SUPP: 25.0000 mg | Freq: Four times a day (QID) | RECTAL | 0 refills | Status: DC | PRN

## 2022-04-16 NOTE — ED Provider Notes (Signed)
[place] Provider Note   CSN: [id number] Arrival date & time: 04/16/22  1105     History Chief Complaint  Patient presents with   Nausea   Emesis   Abdominal Pain    HPI Kelli Hudson is a 32 y.o. female presenting for acute on chronic abdominal pain.  Extensive history seen here many times for similar.  Discharged from hospital for abdominal pain 2 weeks ago.  She states that she has had recurrence of her nausea and vomiting.  She states she has been compliant with her medications but as of today she was unable to keep down her antiemetics. She denies fevers or chills, syncope shortness of breath.  Otherwise ambulatory.  States she has not been able to keep anything down at this time.   Patient's recorded medical, surgical, social, medication list and allergies were reviewed in the Snapshot window as part of the initial history.   Review of Systems   Review of Systems  Constitutional:  Negative for chills and fever.  HENT:  Negative for ear pain and sore throat.   Eyes:  Negative for pain and visual disturbance.  Respiratory:  Negative for cough and shortness of breath.   Cardiovascular:  Negative for chest pain and palpitations.  Gastrointestinal:  Positive for abdominal pain and nausea. Negative for vomiting.  Genitourinary:  Negative for dysuria and hematuria.  Musculoskeletal:  Negative for arthralgias and back pain.  Skin:  Negative for color change and rash.  Neurological:  Negative for seizures and syncope.  All other systems reviewed and are negative.   Physical Exam Updated Vital Signs BP 133/84   Pulse (!) 113   Temp 98.6 F (37 C) (Oral)   Resp 12   Ht '5\' 8"'$  (1.727 m)   Wt 80 kg   SpO2 97%   BMI 26.82 kg/m  Physical Exam Vitals and nursing note reviewed.  Constitutional:      General: She is not in acute distress.    Appearance: She is well-developed.  HENT:     Head: Normocephalic and  atraumatic.  Eyes:     Conjunctiva/sclera: Conjunctivae normal.  Cardiovascular:     Rate and Rhythm: Normal rate and regular rhythm.     Heart sounds: No murmur heard. Pulmonary:     Effort: Pulmonary effort is normal. No respiratory distress.     Breath sounds: Normal breath sounds.  Abdominal:     Palpations: Abdomen is soft.     Tenderness: There is generalized abdominal tenderness and tenderness in the epigastric area.  Musculoskeletal:        General: No swelling.     Cervical back: Neck supple.  Skin:    General: Skin is warm and dry.     Capillary Refill: Capillary refill takes less than 2 seconds.  Neurological:     Mental Status: She is alert.  Psychiatric:        Mood and Affect: Mood normal.      ED Course/ Medical Decision Making/ A&P Clinical Course as of 04/16/22 1508  Wed Apr 16, 2022  1300 Labs/IVF and RA [CC]    Clinical Course User Index [CC] Tretha Sciara, MD    Procedures Procedures   Medications Ordered in ED Medications  lactated ringers bolus 1,000 mL (1,000 mLs Intravenous New Bag/Given 04/16/22 1330)  droperidol (INAPSINE) 2.5 MG/ML injection 2.5 mg (2.5 mg Intravenous Given 04/16/22 1416)    Medical Decision Making:    Kelli Hudson  is a 32 y.o. female who presented to the ED today with abdominal pain detailed above.    Complete initial physical exam performed, notably the patient  was hemodynamically stable in no acute distress.      Reviewed and confirmed nursing documentation for past medical history, family history, social history.    Initial Assessment:   Patient's history present illness and physical exam findings are most consistent with chronic abdominal pain. Alternative etiologies are considered including diabetic ketoacidosis, critical dehydration, anemia and the seem less likely given her current presentation.  Not tachycardic does not appear floridly dehydrated on initial physical exam. Screening labs ordered to  identify DKA or other emergent pathology.  Patient will be resuscitated with IV fluids 1 L and will be treated for symptoms with droperidol. Will plan for reassessment. Patient requesting hydromorphone by brand name immediately on arrival.  Informed patient that further narcotic administration in the setting of diagnosed gastroparesis is unlikely to be helpful in long-term management of gastroparesis.  Advised patient that further narcotic administration is likely not indicated at this time.  She has trialed IV narcotic therapy multiple times without any substantial lasting resolution of her syndrome and I do not recommend further administration of IV narcotics at this time.  Will treat her with antiemetics/atypical analgesia and plan for reassessment following laboratory results.  Final Assessment and Plan:   Reassessed after 4 hours in emergency room.  Heart rate has improved to 90 on the monitor.  Patient has been resting comfortably throughout this duration.  Has tolerated liquid intake.  Will prescribe rectal Phenergan   Disposition:  I have considered need for hospitalization, however, considering all of the above, I believe this patient is stable for discharge at this time.  Patient/family educated about specific return precautions for given chief complaint and symptoms.  Patient/family educated about follow-up with PCP.     Patient/family expressed understanding of return precautions and need for follow-up. Patient spoken to regarding all imaging and laboratory results and appropriate follow up for these results. All education provided in verbal form with additional information in written form. Time was allowed for answering of patient questions. Patient discharged.    Emergency Department Medication Summary:   Medications  lactated ringers bolus 1,000 mL (1,000 mLs Intravenous New Bag/Given 04/16/22 1330)  droperidol (INAPSINE) 2.5 MG/ML injection 2.5 mg (2.5 mg Intravenous Given 04/16/22  1416)         Clinical Impression:  1. Generalized abdominal pain      Discharge   Final Clinical Impression(s) / ED Diagnoses Final diagnoses:  Generalized abdominal pain    Rx / DC Orders ED Discharge Orders          Ordered    promethazine (PHENERGAN) 25 MG suppository  Every 6 hours PRN        04/16/22 1506              Tretha Sciara, MD 04/16/22 [phone]

## 2022-04-16 NOTE — ED Triage Notes (Signed)
Patient is here for evaluation of gastroparesis. States she has been diagnosed with this and has been having severe abdominal pain with nausea and vomiting this morning.

## 2022-04-17 ENCOUNTER — Inpatient Hospital Stay (HOSPITAL_COMMUNITY)
Admission: EM | Admit: 2022-04-17 | Discharge: 2022-04-21 | DRG: 074 | Disposition: A | Discharge status: Home / Self Care | Payer: Medicaid Other | Attending: Internal Medicine | Admitting: Internal Medicine

## 2022-04-17 ENCOUNTER — Other Ambulatory Visit: Payer: Self-pay

## 2022-04-17 ENCOUNTER — Encounter (HOSPITAL_COMMUNITY): Payer: Self-pay | Admitting: Emergency Medicine

## 2022-04-17 DIAGNOSIS — K2970 Gastritis, unspecified, without bleeding: Secondary | ICD-10-CM | POA: Diagnosis present

## 2022-04-17 DIAGNOSIS — Z89422 Acquired absence of other left toe(s): Secondary | ICD-10-CM

## 2022-04-17 DIAGNOSIS — Z950 Presence of cardiac pacemaker: Secondary | ICD-10-CM

## 2022-04-17 DIAGNOSIS — E1069 Type 1 diabetes mellitus with other specified complication: Secondary | ICD-10-CM | POA: Diagnosis present

## 2022-04-17 DIAGNOSIS — Z888 Allergy status to other drugs, medicaments and biological substances status: Secondary | ICD-10-CM

## 2022-04-17 DIAGNOSIS — N179 Acute kidney failure, unspecified: Secondary | ICD-10-CM | POA: Diagnosis present

## 2022-04-17 DIAGNOSIS — Z794 Long term (current) use of insulin: Secondary | ICD-10-CM

## 2022-04-17 DIAGNOSIS — Z818 Family history of other mental and behavioral disorders: Secondary | ICD-10-CM

## 2022-04-17 DIAGNOSIS — I152 Hypertension secondary to endocrine disorders: Secondary | ICD-10-CM | POA: Diagnosis present

## 2022-04-17 DIAGNOSIS — E1043 Type 1 diabetes mellitus with diabetic autonomic (poly)neuropathy: Principal | ICD-10-CM | POA: Diagnosis present

## 2022-04-17 DIAGNOSIS — K2100 Gastro-esophageal reflux disease with esophagitis, without bleeding: Secondary | ICD-10-CM | POA: Diagnosis present

## 2022-04-17 DIAGNOSIS — K3184 Gastroparesis: Secondary | ICD-10-CM | POA: Diagnosis present

## 2022-04-17 DIAGNOSIS — E1143 Type 2 diabetes mellitus with diabetic autonomic (poly)neuropathy: Secondary | ICD-10-CM | POA: Diagnosis present

## 2022-04-17 DIAGNOSIS — Z803 Family history of malignant neoplasm of breast: Secondary | ICD-10-CM

## 2022-04-17 DIAGNOSIS — E876 Hypokalemia: Secondary | ICD-10-CM | POA: Diagnosis present

## 2022-04-17 DIAGNOSIS — Z801 Family history of malignant neoplasm of trachea, bronchus and lung: Secondary | ICD-10-CM

## 2022-04-17 DIAGNOSIS — R112 Nausea with vomiting, unspecified: Secondary | ICD-10-CM | POA: Diagnosis present

## 2022-04-17 DIAGNOSIS — E039 Hypothyroidism, unspecified: Secondary | ICD-10-CM | POA: Diagnosis present

## 2022-04-17 DIAGNOSIS — Z765 Malingerer [conscious simulation]: Secondary | ICD-10-CM

## 2022-04-17 DIAGNOSIS — Z833 Family history of diabetes mellitus: Secondary | ICD-10-CM

## 2022-04-17 DIAGNOSIS — Z800 Family history of malignant neoplasm of digestive organs: Secondary | ICD-10-CM

## 2022-04-17 DIAGNOSIS — E1065 Type 1 diabetes mellitus with hyperglycemia: Secondary | ICD-10-CM | POA: Diagnosis present

## 2022-04-17 DIAGNOSIS — E1159 Type 2 diabetes mellitus with other circulatory complications: Secondary | ICD-10-CM | POA: Diagnosis present

## 2022-04-17 DIAGNOSIS — Z1152 Encounter for screening for COVID-19: Secondary | ICD-10-CM

## 2022-04-17 DIAGNOSIS — E109 Type 1 diabetes mellitus without complications: Secondary | ICD-10-CM | POA: Diagnosis present

## 2022-04-17 DIAGNOSIS — Z6826 Body mass index (BMI) 26.0-26.9, adult: Secondary | ICD-10-CM

## 2022-04-17 DIAGNOSIS — Z8041 Family history of malignant neoplasm of ovary: Secondary | ICD-10-CM

## 2022-04-17 DIAGNOSIS — E785 Hyperlipidemia, unspecified: Secondary | ICD-10-CM | POA: Diagnosis present

## 2022-04-17 DIAGNOSIS — K920 Hematemesis: Secondary | ICD-10-CM

## 2022-04-17 DIAGNOSIS — Z79899 Other long term (current) drug therapy: Secondary | ICD-10-CM

## 2022-04-17 DIAGNOSIS — E10649 Type 1 diabetes mellitus with hypoglycemia without coma: Secondary | ICD-10-CM | POA: Diagnosis not present

## 2022-04-17 DIAGNOSIS — E440 Moderate protein-calorie malnutrition: Secondary | ICD-10-CM | POA: Insufficient documentation

## 2022-04-17 DIAGNOSIS — F419 Anxiety disorder, unspecified: Secondary | ICD-10-CM | POA: Diagnosis present

## 2022-04-17 LAB — COMPREHENSIVE METABOLIC PANEL
ALT: 23 U/L (ref 0–44)
AST: 29 U/L (ref 15–41)
Albumin: 3.4 g/dL — ABNORMAL LOW (ref 3.5–5.0)
Alkaline Phosphatase: 76 U/L (ref 38–126)
Anion gap: 12 (ref 5–15)
BUN: 22 mg/dL — ABNORMAL HIGH (ref 6–20)
CO2: 22 mmol/L (ref 22–32)
Calcium: 9.3 mg/dL (ref 8.9–10.3)
Chloride: 100 mmol/L (ref 98–111)
Creatinine, Ser: 1.49 mg/dL — ABNORMAL HIGH (ref 0.44–1.00)
GFR, Estimated: 48 mL/min — ABNORMAL LOW (ref >60)
Glucose, Bld: 257 mg/dL — ABNORMAL HIGH (ref 70–99)
Potassium: 3.3 mmol/L — ABNORMAL LOW (ref 3.5–5.1)
Sodium: 134 mmol/L — ABNORMAL LOW (ref 135–145)
Total Bilirubin: 1.2 mg/dL (ref 0.3–1.2)
Total Protein: 7.6 g/dL (ref 6.5–8.1)

## 2022-04-17 LAB — CBG MONITORING, ED
Glucose-Capillary: 243 mg/dL — ABNORMAL HIGH (ref 70–99)
Glucose-Capillary: 162 mg/dL — ABNORMAL HIGH (ref 70–99)

## 2022-04-17 LAB — CBC WITH DIFFERENTIAL/PLATELET
Abs Immature Granulocytes: 0.02 10*3/uL (ref 0.00–0.07)
Basophils Absolute: 0 10*3/uL (ref 0.0–0.1)
Basophils Relative: 1 %
Eosinophils Absolute: 0.1 10*3/uL (ref 0.0–0.5)
Eosinophils Relative: 1 %
HCT: 34.9 % — ABNORMAL LOW (ref 36.0–46.0)
Hemoglobin: 11.7 g/dL — ABNORMAL LOW (ref 12.0–15.0)
Immature Granulocytes: 0 %
Lymphocytes Relative: 24 %
Lymphs Abs: 2 10*3/uL (ref 0.7–4.0)
MCH: 28.9 pg (ref 26.0–34.0)
MCHC: 33.5 g/dL (ref 30.0–36.0)
MCV: 86.2 fL (ref 80.0–100.0)
Monocytes Absolute: 0.4 10*3/uL (ref 0.1–1.0)
Monocytes Relative: 5 %
Neutro Abs: 5.8 10*3/uL (ref 1.7–7.7)
Neutrophils Relative %: 69 %
Platelets: 415 10*3/uL — ABNORMAL HIGH (ref 150–400)
RBC: 4.05 MIL/uL (ref 3.87–5.11)
RDW: 13.6 % (ref 11.5–15.5)
WBC: 8.3 10*3/uL (ref 4.0–10.5)
nRBC: 0 % (ref 0.0–0.2)

## 2022-04-17 LAB — GLUCOSE, CAPILLARY: Glucose-Capillary: 161 mg/dL — ABNORMAL HIGH (ref 70–99)

## 2022-04-17 LAB — LIPASE, BLOOD: Lipase: 29 U/L (ref 11–51)

## 2022-04-17 MED ORDER — GABAPENTIN 300 MG PO CAPS
300.0000 mg | ORAL_CAPSULE | Freq: Three times a day (TID) | ORAL | Status: DC
  Administered 2022-04-17 – 2022-04-21 (×11): 300 mg via ORAL
  Filled 2022-04-17 (×11): qty 1

## 2022-04-17 MED ORDER — MELATONIN 5 MG PO TABS
10.0000 mg | ORAL_TABLET | Freq: Every evening | ORAL | Status: DC | PRN
  Administered 2022-04-17 – 2022-04-18 (×2): 10 mg via ORAL
  Filled 2022-04-17 (×2): qty 2

## 2022-04-17 MED ORDER — ONDANSETRON HCL 4 MG/2ML IJ SOLN
4.0000 mg | Freq: Four times a day (QID) | INTRAMUSCULAR | Status: DC | PRN
  Administered 2022-04-18 – 2022-04-20 (×3): 4 mg via INTRAVENOUS
  Filled 2022-04-17 (×4): qty 2

## 2022-04-17 MED ORDER — ACETAMINOPHEN 650 MG RE SUPP
650.0000 mg | Freq: Four times a day (QID) | RECTAL | Status: DC | PRN
  Administered 2022-04-17: 650 mg via RECTAL
  Filled 2022-04-17: qty 1

## 2022-04-17 MED ORDER — PROMETHAZINE HCL 25 MG RE SUPP: 25.0000 mg | Freq: Four times a day (QID) | RECTAL | 0 refills | Status: DC | PRN

## 2022-04-17 MED ORDER — SODIUM CHLORIDE 0.9 % IV BOLUS
1000.0000 mL | Freq: Once | INTRAVENOUS | Status: AC
  Administered 2022-04-17: 1000 mL via INTRAVENOUS

## 2022-04-17 MED ORDER — HYDROXYZINE HCL 25 MG PO TABS
25.0000 mg | ORAL_TABLET | Freq: Three times a day (TID) | ORAL | Status: DC | PRN
  Administered 2022-04-17 – 2022-04-18 (×4): 25 mg via ORAL
  Filled 2022-04-17 (×4): qty 1

## 2022-04-17 MED ORDER — LORAZEPAM 2 MG/ML IJ SOLN
1.0000 mg | Freq: Once | INTRAMUSCULAR | Status: AC
  Administered 2022-04-17: 1 mg via INTRAVENOUS
  Filled 2022-04-17: qty 1

## 2022-04-17 MED ORDER — DIPHENHYDRAMINE HCL 50 MG/ML IJ SOLN
12.5000 mg | Freq: Once | INTRAMUSCULAR | Status: AC
  Administered 2022-04-17: 12.5 mg via INTRAVENOUS
  Filled 2022-04-17: qty 1

## 2022-04-17 MED ORDER — HYDRALAZINE HCL 20 MG/ML IJ SOLN
5.0000 mg | INTRAMUSCULAR | Status: DC | PRN
  Filled 2022-04-17: qty 1

## 2022-04-17 MED ORDER — KETOROLAC TROMETHAMINE 15 MG/ML IJ SOLN
15.0000 mg | Freq: Once | INTRAMUSCULAR | Status: AC
  Administered 2022-04-17: 15 mg via INTRAVENOUS
  Filled 2022-04-17: qty 1

## 2022-04-17 MED ORDER — METOCLOPRAMIDE HCL 5 MG/ML IJ SOLN
10.0000 mg | Freq: Once | INTRAMUSCULAR | Status: AC
  Administered 2022-04-17: 10 mg via INTRAVENOUS
  Filled 2022-04-17: qty 2

## 2022-04-17 MED ORDER — LACTATED RINGERS IV SOLN: INTRAVENOUS | Status: DC

## 2022-04-17 MED ORDER — LOSARTAN POTASSIUM 50 MG PO TABS
25.0000 mg | ORAL_TABLET | Freq: Every day | ORAL | Status: DC
  Administered 2022-04-18 – 2022-04-21 (×4): 25 mg via ORAL
  Filled 2022-04-17 (×4): qty 1

## 2022-04-17 MED ORDER — INSULIN DETEMIR 100 UNIT/ML ~~LOC~~ SOLN
13.0000 [IU] | Freq: Two times a day (BID) | SUBCUTANEOUS | Status: DC
  Administered 2022-04-17 – 2022-04-20 (×7): 13 [IU] via SUBCUTANEOUS
  Filled 2022-04-17 (×8): qty 0.13

## 2022-04-17 MED ORDER — INSULIN ASPART 100 UNIT/ML IJ SOLN
0.0000 [IU] | Freq: Three times a day (TID) | INTRAMUSCULAR | Status: DC
  Administered 2022-04-17: 3 [IU] via SUBCUTANEOUS
  Administered 2022-04-18: 2 [IU] via SUBCUTANEOUS
  Administered 2022-04-18 (×2): 5 [IU] via SUBCUTANEOUS
  Administered 2022-04-19 (×2): 3 [IU] via SUBCUTANEOUS
  Administered 2022-04-20: 2 [IU] via SUBCUTANEOUS
  Administered 2022-04-20: 5 [IU] via SUBCUTANEOUS
  Administered 2022-04-21: 2 [IU] via SUBCUTANEOUS
  Filled 2022-04-17: qty 0.15

## 2022-04-17 MED ORDER — SODIUM CHLORIDE 0.9% FLUSH
3.0000 mL | Freq: Two times a day (BID) | INTRAVENOUS | Status: DC
  Administered 2022-04-17 – 2022-04-21 (×4): 3 mL via INTRAVENOUS

## 2022-04-17 MED ORDER — INSULIN ASPART 100 UNIT/ML IJ SOLN
0.0000 [IU] | Freq: Every day | INTRAMUSCULAR | Status: DC
  Filled 2022-04-17: qty 0.05

## 2022-04-17 MED ORDER — LIDOCAINE 5 % EX PTCH
1.0000 | MEDICATED_PATCH | CUTANEOUS | Status: DC
  Administered 2022-04-17 – 2022-04-20 (×3): 1 via TRANSDERMAL
  Filled 2022-04-17 (×4): qty 1

## 2022-04-17 MED ORDER — ATORVASTATIN CALCIUM 20 MG PO TABS
20.0000 mg | ORAL_TABLET | Freq: Every day | ORAL | Status: DC
  Administered 2022-04-18 – 2022-04-21 (×4): 20 mg via ORAL
  Filled 2022-04-17 (×4): qty 1

## 2022-04-17 MED ORDER — HYDROXYZINE PAMOATE 25 MG PO CAPS
25.0000 mg | ORAL_CAPSULE | Freq: Three times a day (TID) | ORAL | Status: DC | PRN
  Filled 2022-04-17: qty 1

## 2022-04-17 MED ORDER — INSULIN DETEMIR 100 UNIT/ML FLEXPEN: 13.0000 [IU] | PEN_INJECTOR | Freq: Two times a day (BID) | SUBCUTANEOUS | Status: DC

## 2022-04-17 MED ORDER — SCOPOLAMINE 1 MG/3DAYS TD PT72
1.0000 | MEDICATED_PATCH | TRANSDERMAL | Status: DC
  Administered 2022-04-17 – 2022-04-20 (×2): 1.5 mg via TRANSDERMAL
  Filled 2022-04-17 (×2): qty 1

## 2022-04-17 MED ORDER — CARVEDILOL 6.25 MG PO TABS
6.2500 mg | ORAL_TABLET | Freq: Two times a day (BID) | ORAL | Status: DC
  Administered 2022-04-17 – 2022-04-21 (×8): 6.25 mg via ORAL
  Filled 2022-04-17 (×8): qty 1

## 2022-04-17 MED ORDER — ONDANSETRON HCL 4 MG PO TABS
4.0000 mg | ORAL_TABLET | Freq: Four times a day (QID) | ORAL | Status: DC | PRN
  Administered 2022-04-19: 4 mg via ORAL
  Filled 2022-04-17: qty 1

## 2022-04-17 MED ORDER — LABETALOL HCL 5 MG/ML IV SOLN
10.0000 mg | INTRAVENOUS | Status: AC | PRN
  Administered 2022-04-17 (×2): 10 mg via INTRAVENOUS
  Filled 2022-04-17 (×3): qty 4

## 2022-04-17 MED ORDER — PROMETHAZINE HCL 25 MG RE SUPP
25.0000 mg | Freq: Four times a day (QID) | RECTAL | Status: DC | PRN
  Administered 2022-04-18: 25 mg via RECTAL
  Filled 2022-04-17 (×3): qty 1

## 2022-04-17 MED ORDER — ACETAMINOPHEN 325 MG PO TABS
650.0000 mg | ORAL_TABLET | Freq: Four times a day (QID) | ORAL | Status: DC | PRN
  Administered 2022-04-18 (×2): 650 mg via ORAL
  Filled 2022-04-17 (×2): qty 2

## 2022-04-17 MED ORDER — DROPERIDOL 2.5 MG/ML IJ SOLN
2.5000 mg | Freq: Once | INTRAMUSCULAR | Status: AC
  Administered 2022-04-17: 2.5 mg via INTRAVENOUS
  Filled 2022-04-17: qty 2

## 2022-04-17 MED ORDER — LOSARTAN POTASSIUM 25 MG PO TABS
25.0000 mg | ORAL_TABLET | Freq: Once | ORAL | Status: AC
  Administered 2022-04-17: 25 mg via ORAL
  Filled 2022-04-17: qty 1

## 2022-04-17 NOTE — ED Notes (Signed)
Pt has received apple sauce and water for PO/fluid challenge.

## 2022-04-17 NOTE — ED Provider Notes (Addendum)
[name] Provider Note   CSN: [id number] Arrival date & time: 04/17/22  1134     History  Chief Complaint  Patient presents with   Emesis    Kelli Hudson is a 32 y.o. female with a past medical history of type 1 diabetes with lower medication adherence, chronic abdominal pain and recurrent gastroparesis presenting today with concern for gastroparesis flare.  Was seen yesterday.  Reports that she is vomiting all day and unable to hold medications from home.   Emesis Associated symptoms: abdominal pain        Home Medications Prior to Admission medications   Medication Sig Start Date End Date Taking? Authorizing Provider  atorvastatin (LIPITOR) 20 MG tablet Take 1 tablet (20 mg total) by mouth daily. 10/29/21   [name], MD  carvedilol (COREG) 6.25 MG tablet Take 6.25 mg by mouth 2 (two) times daily. 03/22/22   [provider]  Continuous Blood Gluc Transmit (DEXCOM G6 TRANSMITTER) MISC Change every 90 days 11/29/21   Shamleffer, Melanie Crazier, MD  gabapentin (NEURONTIN) 300 MG capsule Take 1 capsule (300 mg total) by mouth 3 (three) times daily. 12/30/21 04/07/22  Barb Merino, MD  hydrOXYzine (VISTARIL) 25 MG capsule Take 1 capsule (25 mg total) by mouth every 8 (eight) hours as needed. Patient taking differently: Take 25 mg by mouth every 8 (eight) hours as needed for anxiety. 07/31/21   Georgette Shell, MD  insulin detemir (LEVEMIR FLEXTOUCH) 100 UNIT/ML FlexPen Inject 13 Units into the skin 2 (two) times daily. 03/25/22   Mercy Riding, MD  insulin regular (NOVOLIN R) 100 units/mL injection Inject 0-0.15 mLs (0-15 Units total) into the skin See admin instructions. Inject 0-15 units into the skin three times a day with meals, per sliding scale 03/06/22   Shelly Coss, MD  losartan (COZAAR) 25 MG tablet Take 1 tablet (25 mg total) by mouth daily. 03/06/22   Shelly Coss, MD  ondansetron (ZOFRAN) 4 MG  tablet Take 4 mg by mouth every 8 (eight) hours as needed for nausea or vomiting.    [provider]  pantoprazole (PROTONIX) 40 MG tablet Take 1 tablet (40 mg total) by mouth 2 (two) times daily before a meal. 11/08/21   Hosie Poisson, MD  promethazine (PHENERGAN) 25 MG suppository Place 1 suppository (25 mg total) rectally every 6 (six) hours as needed for nausea or vomiting. 04/16/22   Tretha Sciara, MD  insulin aspart (NOVOLOG) 100 UNIT/ML FlexPen Inject 5 Units into the skin 3 (three) times daily with meals. 02/23/18 07/05/19  Donne Hazel, MD      Allergies    Lisinopril    Review of Systems   Review of Systems  Gastrointestinal:  Positive for abdominal pain, nausea and vomiting.    Physical Exam Updated Vital Signs BP (!) 177/126 (BP Location: Left Arm)   Pulse (!) 120   Temp 98.1 F (36.7 C) (Oral)   Resp 20   SpO2 100%  Physical Exam Vitals and nursing note reviewed.  Constitutional:      General: She is not in acute distress.    Appearance: Normal appearance.  HENT:     Head: Normocephalic and atraumatic.  Eyes:     General: No scleral icterus.    Conjunctiva/sclera: Conjunctivae normal.  Pulmonary:     Effort: Pulmonary effort is normal. No respiratory distress.  Skin:    General: Skin is warm and dry.     Findings:  No rash.  Neurological:     Mental Status: She is alert.  Psychiatric:        Mood and Affect: Mood normal.     ED Results / Procedures / Treatments   Labs (all labs ordered are listed, but only abnormal results are displayed) Labs Reviewed  CBC WITH DIFFERENTIAL/PLATELET - Abnormal; Notable for the following components:      Result Value   Hemoglobin 11.7 (*)    HCT 34.9 (*)    Platelets 415 (*)    All other components within normal limits  COMPREHENSIVE METABOLIC PANEL - Abnormal; Notable for the following components:   Sodium 134 (*)    Potassium 3.3 (*)    Glucose, Bld 257 (*)    BUN 22 (*)    Creatinine, Ser 1.49 (*)     Albumin 3.4 (*)    GFR, Estimated 48 (*)    All other components within normal limits  CBG MONITORING, ED - Abnormal; Notable for the following components:   Glucose-Capillary 243 (*)    All other components within normal limits  LIPASE, BLOOD    EKG None  Radiology No results found.  Procedures Procedures   Medications Ordered in ED Medications  scopolamine (TRANSDERM-SCOP) 1 MG/3DAYS 1.5 mg (1.5 mg Transdermal Patch Applied 04/17/22 1408)  losartan (COZAAR) tablet 25 mg (has no administration in time range)  sodium chloride 0.9 % bolus 1,000 mL (1,000 mLs Intravenous New Bag/Given 04/17/22 1244)  droperidol (INAPSINE) 2.5 MG/ML injection 2.5 mg (2.5 mg Intravenous Given 04/17/22 1245)  sodium chloride 0.9 % bolus 1,000 mL (1,000 mLs Intravenous New Bag/Given 04/17/22 1316)  ketorolac (TORADOL) 15 MG/ML injection 15 mg (15 mg Intravenous Given 04/17/22 1403)  LORazepam (ATIVAN) injection 1 mg (1 mg Intravenous Given 04/17/22 1400)    ED Course/ Medical Decision Making/ A&P Clinical Course as of 04/17/22 1835  Thu Apr 17, 2022  1336 Nursing staff told me that the patient wanted to speak to me.  She said that she needed "the strongest pain medication."  We discussed that opioids are not indicated in this situation and have been improving to make gastroparesis worse.  She tells me that the pain is too severe for any other medications I am giving her.  So far she has gotten droperidol and fluids.  Will give Toradol, Ativan and scopolamine patch and reassess.  Suspect patient will want to be admitted, she reportedly reported being discharged home to the [facility] yesterday [MR]  1430 HR at bedside 104 [MR]  1502 Patient made aware that hospitalist does not believe she requires admission but that they will come and consult. She says she would like to wait for this.  [MR]  1634 I had a lengthy conversation with the patient's mother about her care today.  We discussed that she has been given  multiple medications and has been able to eat, drink and take p.o. medications.  Therefore she is passing a p.o. challenge.  We discussed that I tried to admit her to the hospital however she did not meet the hospitalist criteria for admission.  Patient's mother is concerned that she is not being given any medication because she is in pain.  We discussed that opioid medications have been proven to make gastroparesis worse.  She continues to report that her daughter is "not an addict and does not even take Tylenol or ibuprofen as she has a headache."  I encouraged the mother that the patient should take these medications for pain  and not rely on IV pain control in the emergency department.  Mother is concerned that she has had multiple visits over the past week and that nobody is helping her.  We also discussed that gastroparesis is likely being caused by her poorly controlled diabetes.  I explained to her that ultimately better diabetic control will lead to less episodes of gastroparesis.  We also discussed that the patient did not pick up the Phenergan suppositories that were sent yesterday.  Patient and her mother report that insurance did not cover this.  I personally looked for online coupons and printed 1 out for the patient.  Mother was made aware of this.  At this time there is not much else for the ED team to do.  Patient is tolerating p.o. intake and will be discharged at this time. [MR]  1739 Patient was going to be discharged however on reevaluation she has dark emesis in the emesis bag.  Tachycardic around 120 at this time as well.  Dr. Lorin Mercy made aware who will observe the patient overnight. [MR]  1835 Spoke with GI who will see the patient tomorrow [MR]    Clinical Course User Index [MR] Olen Eaves, Cecilio Asper, PA-C                             Medical Decision Making Amount and/or Complexity of Data Reviewed Labs: ordered.  Risk Prescription drug management. Decision regarding  hospitalization.   32 year old female presenting today with abdominal pain, nausea, and vomiting.  She says that it feels exactly like her previous gastroparesis.  I did consider other etiologies such as pancreatitis however she had a normal lipase yesterday.  Also had no leukocytosis to indicate infectious process.     This is not an exhaustive differential.    Past Medical History / Co-morbidities / Social History: Type 1 diabetes, recurrent gastroparesis   Additional history: I viewed patient's chart and her visit yesterday.  She was told that narcotics are not indicated.  Also has a previous hospitalist note that is as follows:  Has been made clear to pt from beginning: no narcotics this admission. 1. Concern for drug seeking behavior / developing narcotic dependence documented by other providers in chart 2. Concern that her gastroparesis and dysmotility is further worsened by narcotic meds. 1. This is also extensively documented by prior providers and GI specialists in chart. 2. For more info, please see extensive discussions by prior hospitalists and GI docs during    A1C earlier this month was 11.2. She consistently is nonadherent to her regimen despite diabetes coordinator and social work assistance  Also appears she was dismissed from her family medicine group on 03/03/2022.  Physical Exam: Pertinent physical exam findings include Generalized tenderness, no active emesis however crying  Lab Tests: I ordered, and personally interpreted labs.  The pertinent results include: Reviewed labs from yesterday without concerning findings. Fingerstick 243. Normal gap, no DKA Cr. 1.49, up from 1.34 yesterday  QtC 444 yesterday. Droperidol thus was given   Medications: I ordered medication including bolus and droperidol. Reevaluation of the patient after these medicines showed that the patient stayed the same.   QTC normal today but with her history I will still try and use  medications that do not prolong QT first. She then was given ativan and toradol and scopolamine patch with mild relief.  Keeps demanding  "THE STRONGEST MEDICINE"   MDM/Disposition: This is a 32 year old female with  a past medical history of type 1 diabetes, poorly controlled with recurrent gastroparesis presenting today with abdominal pain, nausea and vomiting.  Patient was seen yesterday for the same.  When I arrived she was ambulating in the department requesting Dilaudid.  She was told yesterday that this is not indicated.  I reiterated this today.  Per chart review she has multiple visits for the same.  Also has a history of drug-seeking behavior.  She has been seen by general surgery with similar concerns.  Do not believe giving the patient Dilaudid at this time is beneficial for her in the long run.  As noted by previous hospitalist it also may be making her dysmotility worse.  Of note she has not filled the Phenergan suppositories that she was previously prescribed.  Per nursing, patient tried to take the droperidol syringe and put it in her IV faster.  She subsequently tried to come into the provider box and yelled that she needed to speak with somebody.  She reports feeling somewhat better after the Ativan, Toradol and scopolamine patch but says that she still has abdominal pain and is somewhat nauseous and believes she needs to be admitted.  I spoke with the hospitalist, Dr. Lorin Mercy, who agrees to consult however patient does not require admission being that she is not in DKA.  Hospitalist team came and saw the patient and encouraged further fluid resuscitation and antiemetics.  Patient was given fluids and Reglan and able to tolerate p.o. medications as well as crackers and applesauce.  I spoke with her mother about her care as well as her discharge, the conversation is in the ED course above.  Ultimately there is no indication for admission and there is not much more for [place] to do from the ED  standpoint.  She was given a referral to gastroenterology and discharged with Phenergan suppositories as well as a coupon.   At discharge patient started to vomit dark emesis.  Tachycardic in the 120s.  She would prefer to be observed overnight, Dr. Lorin Mercy has agreed to this.  GI was consulted and Dr. Randel Pigg reports that Dr. Deno Etienne will see the patient tomorrow. Dr. Lorin Mercy made aware. Patient updated ad bedside and handed coupon for phenergan suppository to use at dc.  Final Clinical Impression(s) / ED Diagnoses Final diagnoses:  Gastroparesis  Hematemesis with nausea    Rx / DC Orders ED Discharge Orders     None        Danyel Tobey, Cecilio Asper, PA-C 04/17/22 1639     I discussed this case with my attending physician Dr. Tomi Bamberger who cosigned this note including patient's presenting symptoms, physical exam, and planned diagnostics and interventions. Attending physician stated agreement with plan or made changes to plan which were implemented.      Darliss Ridgel 04/17/22 Ward Chatters    Dorie Rank, MD 04/18/22 [phone]

## 2022-04-17 NOTE — Consult Note (Deleted)
ER Consult Note   Patient: Kelli Hudson [id number] DOB: 1991/01/11 DOA: 04/17/2022 DOS: the patient was seen and examined on 04/17/2022 PCP: Pcp, No  Patient coming from: Home  Chief Complaint: N/V   HPI: Kelli Hudson is a 32 y.o. female with medical history significant of poorly controlled T1DM with gastroparesis, HTN, and hyperthyroidism presenting with n/v. She reports that she has terrible gastroparesis and it causes her to have chest and upper abdominal pain.  She reports that they always give her Dilaudid and that makes her better.  She has tried Reglan, Protonix, erythromycin without relief.  She has never been referred to GI and thinks that would be helpful.    ER Course:  Refractory n/v, gastroparesis.  Not tolerating PO.  QTc 444.       Review of Systems: As mentioned in the history of present illness. All other systems reviewed and are negative. Past Medical History:  Diagnosis Date   Acute H. pylori gastric ulcer    Coffee ground emesis    Diabetes mellitus ([place])    Diabetic gastroparesis ([place])    DKA (diabetic ketoacidosis) ([place]) 02/24/2021   Gastroparesis    GERD (gastroesophageal reflux disease)    Hypertension    Hyperthyroidism    Intractable nausea and vomiting 04/20/2021   Normocytic anemia 04/20/2020   Prolonged Q-T interval on ECG    Past Surgical History:  Procedure Laterality Date   AMPUTATION TOE Left 03/10/2018   Procedure: AMPUTATION FIFTH TOE;  Surgeon: Trula Slade, DPM;  Location: [facility];  Service: Podiatry;  Laterality: Left;   BIOPSY  01/28/2020   Procedure: BIOPSY;  Surgeon: Otis Brace, MD;  Location: [facility];  Service: Gastroenterology;;   BOTOX INJECTION  08/26/2021   Procedure: BOTOX INJECTION;  Surgeon: Doran Stabler, MD;  Location: [facility];  Service: Gastroenterology;;   ESOPHAGOGASTRODUODENOSCOPY N/A 01/28/2020   Procedure: ESOPHAGOGASTRODUODENOSCOPY (EGD);  Surgeon:  Otis Brace, MD;  Location: [facility];  Service: Gastroenterology;  Laterality: N/A;   ESOPHAGOGASTRODUODENOSCOPY (EGD) WITH PROPOFOL Left 09/08/2015   Procedure: ESOPHAGOGASTRODUODENOSCOPY (EGD) WITH PROPOFOL;  Surgeon: Arta Silence, MD;  Location: [facility]COPY;  Service: Endoscopy;  Laterality: Left;   ESOPHAGOGASTRODUODENOSCOPY (EGD) WITH PROPOFOL N/A 08/26/2021   Procedure: ESOPHAGOGASTRODUODENOSCOPY (EGD) WITH PROPOFOL;  Surgeon: Doran Stabler, MD;  Location: [facility];  Service: Gastroenterology;  Laterality: N/A;   WISDOM TOOTH EXTRACTION     Social History:  reports that she has never smoked. She has never used smokeless tobacco. She reports current alcohol use of about 1.0 standard drink of alcohol per week. She reports that she does not use drugs.  Allergies  Allergen Reactions   Lisinopril Cough    Breakout in rash    Family History  Problem Relation Age of Onset   Lung cancer Mother    Diabetes Mother    Bipolar disorder Father    Liver cancer Maternal Grandfather    Diabetes Maternal Aunt        x 2   Pancreatic cancer Maternal Uncle    Prostate cancer Maternal Uncle    Breast cancer Other        maternal great aunt   Heart disease Other    Ovarian cancer Other        maternal great aunt   Kidney disease Other        maternal great aunt   Colon cancer Neg Hx    Stomach cancer Neg Hx     Prior to  Admission medications   Medication Sig Start Date End Date Taking? Authorizing Provider  atorvastatin (LIPITOR) 20 MG tablet Take 1 tablet (20 mg total) by mouth daily. 10/29/21   [name], MD  carvedilol (COREG) 6.25 MG tablet Take 6.25 mg by mouth 2 (two) times daily. 03/22/22   [provider]  Continuous Blood Gluc Transmit (DEXCOM G6 TRANSMITTER) MISC Change every 90 days 11/29/21   Shamleffer, Melanie Crazier, MD  gabapentin (NEURONTIN) 300 MG capsule Take 1 capsule (300 mg total) by mouth 3 (three) times daily. 12/30/21 04/07/22   Barb Merino, MD  hydrOXYzine (VISTARIL) 25 MG capsule Take 1 capsule (25 mg total) by mouth every 8 (eight) hours as needed. Patient taking differently: Take 25 mg by mouth every 8 (eight) hours as needed for anxiety. 07/31/21   Georgette Shell, MD  insulin detemir (LEVEMIR FLEXTOUCH) 100 UNIT/ML FlexPen Inject 13 Units into the skin 2 (two) times daily. 03/25/22   Mercy Riding, MD  insulin regular (NOVOLIN R) 100 units/mL injection Inject 0-0.15 mLs (0-15 Units total) into the skin See admin instructions. Inject 0-15 units into the skin three times a day with meals, per sliding scale 03/06/22   Shelly Coss, MD  losartan (COZAAR) 25 MG tablet Take 1 tablet (25 mg total) by mouth daily. 03/06/22   Shelly Coss, MD  ondansetron (ZOFRAN) 4 MG tablet Take 4 mg by mouth every 8 (eight) hours as needed for nausea or vomiting.    [provider]  pantoprazole (PROTONIX) 40 MG tablet Take 1 tablet (40 mg total) by mouth 2 (two) times daily before a meal. 11/08/21   Hosie Poisson, MD  promethazine (PHENERGAN) 25 MG suppository Place 1 suppository (25 mg total) rectally every 6 (six) hours as needed for nausea or vomiting. 04/16/22   Tretha Sciara, MD  insulin aspart (NOVOLOG) 100 UNIT/ML FlexPen Inject 5 Units into the skin 3 (three) times daily with meals. 02/23/18 07/05/19  Donne Hazel, MD    Physical Exam: Vitals:   04/17/22 1407 04/17/22 1410 04/17/22 1500 04/17/22 1516  BP: (!) 189/124 (!) 189/118 (!) 176/124 (!) 177/119  Pulse: (!) 111 (!) 115 (!) 115 (!) 117  Resp: '16 18  16  '$ Temp:    98.5 F (36.9 C)  TempSrc:      SpO2: 100% 100% 100% 100%   General:  Appears calm and comfortable and is in NAD Eyes:   EOMI, normal lids, iris ENT:  grossly normal hearing, lips & tongue, mmm Neck:  no LAD, masses or thyromegaly Cardiovascular:  RR with mild tachycardia, no m/r/g. No LE edema.  Respiratory:   CTA bilaterally with no wheezes/rales/rhonchi.  Normal respiratory  effort. Abdomen:  soft,diffusely tender, ND Skin:  no rash or induration seen on limited exam Musculoskeletal:  grossly normal tone BUE/BLE, good ROM, no bony abnormality Psychiatric:  flat mood and affect, speech fluent and appropriate, AOx3 Neurologic:  CN 2-12 grossly intact, moves all extremities in coordinated fashion   Radiological Exams on Admission: Independently reviewed - see discussion in A/P where applicable  No results found.  EKG: Independently reviewed.  Sinus tachycardia with rate 112; nonspecific ST changes with no evidence of acute ischemia   Labs on Admission: I have personally reviewed the available labs and imaging studies at the time of the admission.  Pertinent labs:    K+ 3.3 CO2 22 Glucose 257 BUN 22/Creatinine 1.49/GFR 48 Unremarkable CBC   Assessment and Plan: Active Problems:   Hypertension associated  with diabetes ([place])   DM type 1 (diabetes mellitus, type 1) ([facility])   Gastroparesis   HLD (hyperlipidemia)    Diabetic gastroparesis -Patient with frequent visits for n/v - 9 admissions and 3 ER visits in the last 6 months -She reports that she has not been seen by GI, but she was last seen in 10/2021 by Dr. Havery Moros and has had an extensive treatment course -Opiates are not recommended for treatment of this condition and are contraindicated since they can further slow the gastric motility -She needs ongoing outpatient f/u -She does not have a prolonged QTc at this time and so can take antiemetics including Reglan, Zofran, and prn Phenergan as needed -Strict DM control is likely to be her best option -Given lack of apparent electrolyte derangements, there is not an obvious indication for admission at this time -Would recommend another liter of IVF (ordered), anti-emetics, and home later this afternoon with close GI f/u as an outpatient  DM -Recent A1c was 11.2, very poor control -Current glucose is 257, not in DKA -Continue Levemir and  SSI  HTN -Continue carvedilol, losartan  HLD -Continue atorvastatin     Thank you for this interesting consult.  Based on the patient's overall clinical stability, she does not appear to require admission at this time.  Should her circumstances change, [facility] will be happy to re-engage.  Author: Karmen Bongo, MD 04/17/2022 4:29 PM  For on call review [email or web address].

## 2022-04-17 NOTE — Inpatient Diabetes Management (Signed)
Inpatient Diabetes Program Recommendations  AACE/ADA: New Consensus Statement on Inpatient Glycemic Control (2015)  Target Ranges:  Prepandial:   less than 140 mg/dL      Peak postprandial:   less than 180 mg/dL (1-2 hours)      Critically ill patients:  140 - 180 mg/dL   Lab Results  Component Value Date   GLUCAP 243 (H) 04/17/2022   HGBA1C 11.2 (H) 03/23/2022    Latest Reference Range & Units 04/16/22 12:08 04/17/22 12:20  Glucose-Capillary 70 - 99 mg/dL 417 (H) 243 (H)  (H): Data is abnormally high  Diabetes history: DM1 Outpatient Diabetes medications: Levemir 13 BID, Novolin R 0-15 TID Current orders for Inpatient glycemic control: None  Inpatient Diabetes Program Recommendations:   Pt is currently in ED. -Add Levemir 13 units bid -Novolog 0-9 units q 4 hrs. If NPO, then tid, +0-5 units hs if eating -Add Novolog 3 units tid meal coverage when eating >50% meals  Thank you, Bethena Roys E. Elenore Wanninger, RN, MSN, CDE  Diabetes Coordinator Inpatient Glycemic Control Team Team Pager [phone] (8am-5pm) 04/17/2022 12:55 PM

## 2022-04-17 NOTE — Discharge Instructions (Addendum)
You came to the emergency with a concern of a gastroparesis flare. Your labwork does not show anything acute. The hospitalist team recommends you follow-up with GI. They should call within the next 72 hours.  If they do not call you, please call one of the offices on these papers.  You mentioned that your insurance did not cover the suppositories.  I have attached a coupon from [employer].  You may always use this to figure out which pharmacy is the cheapest.  We hope you feel better.     Gastroparesis Nutrition Therapy  Gastroparesis means that your stomach empties very slowly. This happens when the nerves to your stomach are damaged or do not work properly. This can cause bloating, stomach discomfort or pain, feeling full after eating only a small amount of food, nausea, or vomiting. If you have diabetes in addition to gastroparesis, it is important to control your blood glucose. This will help the stomach empty.  Tips Following these tips may help your stomach empty faster:  Eat small, frequent meals (4 to 6 times per day).  Do not eat solid foods that are high in fat and do not add too much fat to foods. See the Foods Not Recommended table for foods that are high fat.  High-fat solid foods may delay the emptying of your stomach.  Liquids that contain fat, such as milkshakes, may be tolerated and can provide needed calories. Do not eat foods high in fiber. Do not take fiber supplements or fiber bulking agents for constipation.  Do not eat foods that increase acid reflux:  Acidic, spicy, fried and greasy foods  Caffeine  Mint Do not drink alcohol or smoke  Do not drink carbonated beverages, as they increase bloating.  Chew foods well before swallowing. Solid foods in the stomach do not empty well. If you have difficulty tolerating solid foods, ground foods may be better.  If symptoms are severe, semi-solid foods or liquids may need to be your main food sources. Choose liquid nutritional  supplements that have less than or equal to 2 grams fiber per serving.  Sit upright while eating and sit upright or walk after meals. Do not lie down for 3 to 4 hours after eating to avoid reflux or regurgitation.  If you wish to nap during the day, nap first and then eat.  Drinking fluids at meals can take up room in your stomach, and you might not get enough calories. At every meal, first eat a grain food and a protein food or dairy product if your body can tolerate it. Drink fluids with calories. It may be better to delay fluids until after the meal and drink more between meals.   Foods Recommended Food Group Foods Recommended   Grains Choose grain foods with less than 2 grams of fiber per serving; these will be made with white flour Crackers: saltines or graham crackers Cold cereal: puffed rice Cream of rice or wheat Grits (fine ground) Gluten free low fiber foods Pretzels White bread, toasted White rice, cook until very soft  Protein Foods Lean meat and poultry: well-cooked, very tender, moist, and chopped fine Fish: tuna, salmon, or white fish Egg whites, scrambled Peanut butter (limit to 1 tablespoon at a time)  Dairy Milk*, drink 2% if tolerated to get more nutrients or lactose-free 2% milk Fortified non-dairy milks: almond, cashew, coconut, or rice (be aware that these options are not good sources of protein so you will need to eat an additional protein food)  Fortified pea milk or soymilk (may cause gas and bloating for some) Instant breakfast* (pre-made lactose-free is sold in bottles) Milkshakes* (try blending in  to  cup canned fruit) Ice cream* (low-fat may be tolerated better; use in milkshakes to increase calories) Frozen yogurt Yogurt* Puddings and custard* Sherbet Liquid nutritional supplements with less than or equal to 2 grams fiber per 1 cup serving *Use lactose-free varieties to reduce gas and bloating  Vegetables Canned and well-cooked vegetables without seeds,  skins or hulls Carrots, cooked Mashed potatoes (white, red or yellow) Sweet potato  Fruit Canned, soft and well-cooked fruits without seeds, skins or membranes Applesauce Banana, mashed may be tolerated better Diced peaches/pears fruit cups in juice Melon, very soft, cut into small pieces Fruit nectar juices  Oils When possible choose oils rather than solid fats Canola or olive oil Margarine  Other Clear soup Gelatin Popsicles   Foods Not Recommended Food Group Foods Not Recommended   Grains Bran Grains foods with 2 or more grams of fiber per serving: barley, brown rice, kasha, quinoa Popcorn Whole grain and high-fiber cereals, including oats or granola Whole grain bread or pasta  Protein Foods Fried meats, poultry or fish Sausage, bacon or hot dogs Seafood Tough meat, meat with gristle: steak, roast beef or pork chops Beans, peas or lentils Nuts  Dairy Cheese slices Liquid nutritional supplements that have more than 2 grams fiber per serving Pea milk, soymilk (may increase gas and bloating)  Vegetables Raw or undercooked vegetables Alfalfa, asparagus, bean sprouts, broccoli, brussels sprouts, cabbage, cauliflower, corn, green peas or any other kind of peas, lima beans, mushrooms, okra, onions, parsnips, peppers, pickles, potato skins, or spinach  Fruit Fresh fruit except for the ones in the foods recommended table Acidic fruit and juices: oranges/orange juice, grapefruit/grapefruit juice, tomatoes/tomato juice Avocado Berries Coconut Dried fruit Fruit skin Mandarin oranges Pineapple  Oils Fried foods of any type  Other Coffee Olives or pickles Pizza Salsa Sushi   Gastroparesis Sample 1-Day Menu  Breakfast 1 slice white toast (1 carbohydrate serving)  1 teaspoon margarine, soft, tub   cup egg substitute  1 cup peach nectar (2 carbohydrate servings)  Morning Snack Smoothie made with:  small banana (1 carbohydrate serving)  1/3 cup [place] strawberry yogurt (  carbohydrate serving)  1 cup 2% milk (1 carbohydrate serving)  Lunch 2 ounces canned chicken  1 teaspoon mayonnaise  9 saltine crackers (1 carbohydrate servings)   cup applesauce (1 carbohydrate serving)  Afternoon Snack 1 slice white toast (1 carbohydrate serving)  1 tablespoon smooth peanut butter  Evening Meal 2 ounces baked fish   cup mashed potatoes (1 carbohydrate serving)  1 teaspoon olive oil  1 cup 2% milk (1 carbohydrate serving)  Evening Snack 1 packet instant breakfast (1 carbohydrate servings)  1 cup 2% milk (1 carbohydrate serving)   Gastroparesis Vegetarian (Lacto-Ovo) Sample 1-Day Menu  Breakfast  cup cooked farina (1 carbohydrate serving)   cup egg substitute  2 teaspoons olive oil   cup peach nectar (2 carbohydrate servings)   cup 2% milk ( carbohydrate serving)  Morning Snack 1 slice white toast (1 carbohydrate serving)  1 tablespoon smooth peanut butter  Lunch  cup vegetable soup (1 carbohydrate serving)  9 saltine crackers (1 carbohydrate serving)   cup applesauce (1 carbohydrate serving)   cup 2% milk ( carbohydrate serving)  Afternoon Snack 6 ounces plain yogurt (1 carbohydrate serving)   small banana (1 carbohydrate servings)  Evening Meal  cup baked tofu  2/3 cup white rice (2 carbohydrate servings)  2 teaspoons olive oil   cup 2% milk ( carbohydrate serving)  Evening Snack 1 packet instant breakfast (1 carbohydrate servings)  1 cup 2% milk (1 carbohydrate serving)   Gastroparesis Vegan Sample 1-Day Menu  Breakfast  cup cooked farina (1 carbohydrate serving)  1/3 cup tofu scramble  2 teaspoons olive oil   cup peach nectar (2 carbohydrate servings)   cup almond milk fortified with calcium, vitamin B12, and vitamin D  Morning Snack 1 slice white toast (1 carbohydrate serving)  1 tablespoon smooth peanut butter  Lunch  cup vegetable soup (1 carbohydrate serving)  9 saltine crackers (1 carbohydrate serving)   cup applesauce (1  carbohydrate serving)  Afternoon Snack 6 ounces plain soy yogurt (1 carbohydrate servings)   small banana (1 carbohydrate serving)  Evening Meal  cup baked tofu  2/3 cup white rice (2 carbohydrate servings)  2 teaspoons olive oil   cup almond milk fortified with calcium, vitamin B12, and vitamin D  Evening Snack  scoop soy protein powder ( carbohydrate serving)   cup almond milk fortified with calcium, vitamin B12, and vitamin D  Copyright 2020  [employer]. All rights reserved.

## 2022-04-17 NOTE — H&P (Signed)
History and Physical   Patient: Kelli Hudson [id number] DOB: 1990/07/11 DOA: 04/17/2022 DOS: the patient was seen and examined on 04/17/2022 PCP: Pcp, No  Patient coming from: Home  Chief Complaint: N/V   HPI: Kelli Hudson is a 32 y.o. female with medical history significant of poorly controlled T1DM with gastroparesis, HTN, and hyperthyroidism presenting with n/v. She reports that she has terrible gastroparesis and it causes her to have chest and upper abdominal pain.  She reports that they always give her Dilaudid and that makes her better.  She has tried Reglan, Protonix, erythromycin without relief.  She has never been referred to GI and thinks that would be helpful.    ER Course:  Refractory n/v, gastroparesis.  Not tolerating PO.  QTc 444.       Review of Systems: As mentioned in the history of present illness. All other systems reviewed and are negative. Past Medical History:  Diagnosis Date   Acute H. pylori gastric ulcer    Coffee ground emesis    Diabetes mellitus ([place])    Diabetic gastroparesis ([place])    DKA (diabetic ketoacidosis) ([place]) 02/24/2021   Gastroparesis    GERD (gastroesophageal reflux disease)    Hypertension    Hyperthyroidism    Intractable nausea and vomiting 04/20/2021   Normocytic anemia 04/20/2020   Prolonged Q-T interval on ECG    Past Surgical History:  Procedure Laterality Date   AMPUTATION TOE Left 03/10/2018   Procedure: AMPUTATION FIFTH TOE;  Surgeon: Trula Slade, DPM;  Location: [facility];  Service: Podiatry;  Laterality: Left;   BIOPSY  01/28/2020   Procedure: BIOPSY;  Surgeon: Otis Brace, MD;  Location: [facility];  Service: Gastroenterology;;   BOTOX INJECTION  08/26/2021   Procedure: BOTOX INJECTION;  Surgeon: Doran Stabler, MD;  Location: [facility];  Service: Gastroenterology;;   ESOPHAGOGASTRODUODENOSCOPY N/A 01/28/2020   Procedure: ESOPHAGOGASTRODUODENOSCOPY (EGD);   Surgeon: Otis Brace, MD;  Location: [facility];  Service: Gastroenterology;  Laterality: N/A;   ESOPHAGOGASTRODUODENOSCOPY (EGD) WITH PROPOFOL Left 09/08/2015   Procedure: ESOPHAGOGASTRODUODENOSCOPY (EGD) WITH PROPOFOL;  Surgeon: Arta Silence, MD;  Location: [facility];  Service: Endoscopy;  Laterality: Left;   ESOPHAGOGASTRODUODENOSCOPY (EGD) WITH PROPOFOL N/A 08/26/2021   Procedure: ESOPHAGOGASTRODUODENOSCOPY (EGD) WITH PROPOFOL;  Surgeon: Doran Stabler, MD;  Location: [facility];  Service: Gastroenterology;  Laterality: N/A;   WISDOM TOOTH EXTRACTION     Social History:  reports that she has never smoked. She has never used smokeless tobacco. She reports current alcohol use of about 1.0 standard drink of alcohol per week. She reports that she does not use drugs.  Allergies  Allergen Reactions   Lisinopril Cough    Breakout in rash    Family History  Problem Relation Age of Onset   Lung cancer Mother    Diabetes Mother    Bipolar disorder Father    Liver cancer Maternal Grandfather    Diabetes Maternal Aunt        x 2   Pancreatic cancer Maternal Uncle    Prostate cancer Maternal Uncle    Breast cancer Other        maternal great aunt   Heart disease Other    Ovarian cancer Other        maternal great aunt   Kidney disease Other        maternal great aunt   Colon cancer Neg Hx    Stomach cancer Neg Hx     Prior to  Admission medications   Medication Sig Start Date End Date Taking? Authorizing Provider  atorvastatin (LIPITOR) 20 MG tablet Take 1 tablet (20 mg total) by mouth daily. 10/29/21   [name], MD  carvedilol (COREG) 6.25 MG tablet Take 6.25 mg by mouth 2 (two) times daily. 03/22/22   [provider]  Continuous Blood Gluc Transmit (DEXCOM G6 TRANSMITTER) MISC Change every 90 days 11/29/21   Shamleffer, Melanie Crazier, MD  gabapentin (NEURONTIN) 300 MG capsule Take 1 capsule (300 mg total) by mouth 3 (three) times daily. 12/30/21  04/07/22  Barb Merino, MD  hydrOXYzine (VISTARIL) 25 MG capsule Take 1 capsule (25 mg total) by mouth every 8 (eight) hours as needed. Patient taking differently: Take 25 mg by mouth every 8 (eight) hours as needed for anxiety. 07/31/21   Georgette Shell, MD  insulin detemir (LEVEMIR FLEXTOUCH) 100 UNIT/ML FlexPen Inject 13 Units into the skin 2 (two) times daily. 03/25/22   Mercy Riding, MD  insulin regular (NOVOLIN R) 100 units/mL injection Inject 0-0.15 mLs (0-15 Units total) into the skin See admin instructions. Inject 0-15 units into the skin three times a day with meals, per sliding scale 03/06/22   Shelly Coss, MD  losartan (COZAAR) 25 MG tablet Take 1 tablet (25 mg total) by mouth daily. 03/06/22   Shelly Coss, MD  ondansetron (ZOFRAN) 4 MG tablet Take 4 mg by mouth every 8 (eight) hours as needed for nausea or vomiting.    [provider]  pantoprazole (PROTONIX) 40 MG tablet Take 1 tablet (40 mg total) by mouth 2 (two) times daily before a meal. 11/08/21   Hosie Poisson, MD  promethazine (PHENERGAN) 25 MG suppository Place 1 suppository (25 mg total) rectally every 6 (six) hours as needed for nausea or vomiting. 04/16/22   Tretha Sciara, MD  insulin aspart (NOVOLOG) 100 UNIT/ML FlexPen Inject 5 Units into the skin 3 (three) times daily with meals. 02/23/18 07/05/19  Donne Hazel, MD    Physical Exam: Vitals:   04/17/22 1407 04/17/22 1410 04/17/22 1500 04/17/22 1516  BP: (!) 189/124 (!) 189/118 (!) 176/124 (!) 177/119  Pulse: (!) 111 (!) 115 (!) 115 (!) 117  Resp: '16 18  16  '$ Temp:    98.5 F (36.9 C)  TempSrc:      SpO2: 100% 100% 100% 100%   General:  Appears calm and comfortable and is in NAD Eyes:   EOMI, normal lids, iris ENT:  grossly normal hearing, lips & tongue, mmm Neck:  no LAD, masses or thyromegaly Cardiovascular:  RR with mild tachycardia, no m/r/g. No LE edema.  Respiratory:   CTA bilaterally with no wheezes/rales/rhonchi.  Normal respiratory  effort. Abdomen:  soft,diffusely tender, ND Skin:  no rash or induration seen on limited exam Musculoskeletal:  grossly normal tone BUE/BLE, good ROM, no bony abnormality Psychiatric:  flat mood and affect, speech fluent and appropriate, AOx3 Neurologic:  CN 2-12 grossly intact, moves all extremities in coordinated fashion   Radiological Exams on Admission: Independently reviewed - see discussion in A/P where applicable  No results found.  EKG: Independently reviewed.  Sinus tachycardia with rate 112; nonspecific ST changes with no evidence of acute ischemia   Labs on Admission: I have personally reviewed the available labs and imaging studies at the time of the admission.  Pertinent labs:    K+ 3.3 CO2 22 Glucose 257 BUN 22/Creatinine 1.49/GFR 48 Unremarkable CBC   Assessment and Plan: Active Problems:   Hypertension associated  with diabetes ([place])   DM type 1 (diabetes mellitus, type 1) ([facility])   Gastroparesis   HLD (hyperlipidemia)    Diabetic gastroparesis -Patient with frequent visits for n/v - 9 admissions and 3 ER visits in the last 6 months -She reports that she has not been seen by GI, but she was last seen in 10/2021 by Dr. Havery Moros and has had an extensive treatment course -Opiates are not recommended for treatment of this condition and are contraindicated since they can further slow the gastric motility -She needs ongoing outpatient f/u -She does not have a prolonged QTc at this time and so can take antiemetics including Reglan, Zofran, and prn Phenergan as needed -Strict DM control is likely to be her best option -Given lack of apparent electrolyte derangements, there is not an obvious indication for admission at this time -Would recommend another liter of IVF (ordered), anti-emetics, and home later this afternoon with close GI f/u as an outpatient -However, after this plan the patient reported that she developed hematemesis -GI consulted -Will observe  overnight on telemetry -Protonix BID, Reglan, Phenergan suppositories, Zofran prn -NO NARCOTICS  DM -Recent A1c was 11.2, very poor control -Current glucose is 257, not in DKA -Continue Levemir and SSI  HTN -Continue carvedilol, losartan  HLD -Continue atorvastatin     Advance Care Planning:   Code Status: Full Code - Code status was discussed with the patient and/or family at the time of admission.  The patient would want to receive full resuscitative measures at this time.   Consults: GI, nutrition  DVT Prophylaxis: SCDs  Family Communication: None present  Severity of Illness: The appropriate patient status for this patient is OBSERVATION. Observation status is judged to be reasonable and necessary in order to provide the required intensity of service to ensure the patient's safety. The patient's presenting symptoms, physical exam findings, and initial radiographic and laboratory data in the context of their medical condition is felt to place them at decreased risk for further clinical deterioration. Furthermore, it is anticipated that the patient will be medically stable for discharge from the hospital within 2 midnights of admission.     Author: Karmen Bongo, MD 04/17/2022 4:29 PM  For on call review [email or web address].

## 2022-04-17 NOTE — ED Triage Notes (Signed)
The patient presents due to vomiting all day. She has a history of gastroparesis and was seen here yesterday.

## 2022-04-17 NOTE — Progress Notes (Signed)
Spoke with Davy Pique RN concerning patients blood pressure and heart rate, Director Rollene Fare spoke with [facility] who asked that MD reassess the placement of the patient.

## 2022-04-18 DIAGNOSIS — E10649 Type 1 diabetes mellitus with hypoglycemia without coma: Secondary | ICD-10-CM | POA: Diagnosis not present

## 2022-04-18 DIAGNOSIS — E1022 Type 1 diabetes mellitus with diabetic chronic kidney disease: Secondary | ICD-10-CM | POA: Diagnosis not present

## 2022-04-18 DIAGNOSIS — Z950 Presence of cardiac pacemaker: Secondary | ICD-10-CM | POA: Diagnosis not present

## 2022-04-18 DIAGNOSIS — K2970 Gastritis, unspecified, without bleeding: Secondary | ICD-10-CM | POA: Diagnosis present

## 2022-04-18 DIAGNOSIS — F419 Anxiety disorder, unspecified: Secondary | ICD-10-CM | POA: Diagnosis present

## 2022-04-18 DIAGNOSIS — K3184 Gastroparesis: Secondary | ICD-10-CM | POA: Diagnosis present

## 2022-04-18 DIAGNOSIS — K2100 Gastro-esophageal reflux disease with esophagitis, without bleeding: Secondary | ICD-10-CM | POA: Diagnosis present

## 2022-04-18 DIAGNOSIS — E1065 Type 1 diabetes mellitus with hyperglycemia: Secondary | ICD-10-CM | POA: Diagnosis present

## 2022-04-18 DIAGNOSIS — Z8041 Family history of malignant neoplasm of ovary: Secondary | ICD-10-CM | POA: Diagnosis not present

## 2022-04-18 DIAGNOSIS — E039 Hypothyroidism, unspecified: Secondary | ICD-10-CM | POA: Diagnosis present

## 2022-04-18 DIAGNOSIS — E876 Hypokalemia: Secondary | ICD-10-CM | POA: Diagnosis present

## 2022-04-18 DIAGNOSIS — Z765 Malingerer [conscious simulation]: Secondary | ICD-10-CM | POA: Diagnosis not present

## 2022-04-18 DIAGNOSIS — N179 Acute kidney failure, unspecified: Secondary | ICD-10-CM | POA: Diagnosis present

## 2022-04-18 DIAGNOSIS — Z833 Family history of diabetes mellitus: Secondary | ICD-10-CM | POA: Diagnosis not present

## 2022-04-18 DIAGNOSIS — Z800 Family history of malignant neoplasm of digestive organs: Secondary | ICD-10-CM | POA: Diagnosis not present

## 2022-04-18 DIAGNOSIS — Z794 Long term (current) use of insulin: Secondary | ICD-10-CM | POA: Diagnosis not present

## 2022-04-18 DIAGNOSIS — E785 Hyperlipidemia, unspecified: Secondary | ICD-10-CM | POA: Diagnosis present

## 2022-04-18 DIAGNOSIS — I152 Hypertension secondary to endocrine disorders: Secondary | ICD-10-CM

## 2022-04-18 DIAGNOSIS — E1043 Type 1 diabetes mellitus with diabetic autonomic (poly)neuropathy: Secondary | ICD-10-CM | POA: Diagnosis present

## 2022-04-18 DIAGNOSIS — E440 Moderate protein-calorie malnutrition: Secondary | ICD-10-CM | POA: Diagnosis present

## 2022-04-18 DIAGNOSIS — Z1152 Encounter for screening for COVID-19: Secondary | ICD-10-CM | POA: Diagnosis not present

## 2022-04-18 DIAGNOSIS — Z801 Family history of malignant neoplasm of trachea, bronchus and lung: Secondary | ICD-10-CM | POA: Diagnosis not present

## 2022-04-18 DIAGNOSIS — R112 Nausea with vomiting, unspecified: Secondary | ICD-10-CM | POA: Diagnosis present

## 2022-04-18 DIAGNOSIS — E1069 Type 1 diabetes mellitus with other specified complication: Secondary | ICD-10-CM | POA: Diagnosis present

## 2022-04-18 DIAGNOSIS — Z818 Family history of other mental and behavioral disorders: Secondary | ICD-10-CM | POA: Diagnosis not present

## 2022-04-18 DIAGNOSIS — N182 Chronic kidney disease, stage 2 (mild): Secondary | ICD-10-CM

## 2022-04-18 DIAGNOSIS — Z803 Family history of malignant neoplasm of breast: Secondary | ICD-10-CM | POA: Diagnosis not present

## 2022-04-18 DIAGNOSIS — K920 Hematemesis: Secondary | ICD-10-CM | POA: Diagnosis not present

## 2022-04-18 LAB — COMPREHENSIVE METABOLIC PANEL
ALT: 20 U/L (ref 0–44)
AST: 24 U/L (ref 15–41)
Albumin: 3.6 g/dL (ref 3.5–5.0)
Alkaline Phosphatase: 76 U/L (ref 38–126)
Anion gap: 14 (ref 5–15)
BUN: 18 mg/dL (ref 6–20)
CO2: 24 mmol/L (ref 22–32)
Calcium: 9.8 mg/dL (ref 8.9–10.3)
Chloride: 99 mmol/L (ref 98–111)
Creatinine, Ser: 1.26 mg/dL — ABNORMAL HIGH (ref 0.44–1.00)
GFR, Estimated: 59 mL/min — ABNORMAL LOW (ref >60)
Glucose, Bld: 230 mg/dL — ABNORMAL HIGH (ref 70–99)
Potassium: 3.2 mmol/L — ABNORMAL LOW (ref 3.5–5.1)
Sodium: 137 mmol/L (ref 135–145)
Total Bilirubin: 1.1 mg/dL (ref 0.3–1.2)
Total Protein: 8 g/dL (ref 6.5–8.1)

## 2022-04-18 LAB — CBC
HCT: 38.6 % (ref 36.0–46.0)
Hemoglobin: 12.4 g/dL (ref 12.0–15.0)
MCH: 28.9 pg (ref 26.0–34.0)
MCHC: 32.1 g/dL (ref 30.0–36.0)
MCV: 90 fL (ref 80.0–100.0)
Platelets: 431 10*3/uL — ABNORMAL HIGH (ref 150–400)
RBC: 4.29 MIL/uL (ref 3.87–5.11)
RDW: 14 % (ref 11.5–15.5)
WBC: 13.7 10*3/uL — ABNORMAL HIGH (ref 4.0–10.5)
nRBC: 0 % (ref 0.0–0.2)

## 2022-04-18 LAB — GLUCOSE, CAPILLARY
Glucose-Capillary: 209 mg/dL — ABNORMAL HIGH (ref 70–99)
Glucose-Capillary: 218 mg/dL — ABNORMAL HIGH (ref 70–99)
Glucose-Capillary: 226 mg/dL — ABNORMAL HIGH (ref 70–99)
Glucose-Capillary: 127 mg/dL — ABNORMAL HIGH (ref 70–99)
Glucose-Capillary: 165 mg/dL — ABNORMAL HIGH (ref 70–99)

## 2022-04-18 LAB — PROTIME-INR
INR: 1 (ref 0.8–1.2)
Prothrombin Time: 13.5 seconds (ref 11.4–15.2)

## 2022-04-18 MED ORDER — OXYCODONE HCL 5 MG PO TABS
10.0000 mg | ORAL_TABLET | ORAL | Status: DC | PRN
  Administered 2022-04-18 – 2022-04-19 (×3): 10 mg via ORAL
  Filled 2022-04-18 (×3): qty 2

## 2022-04-18 MED ORDER — HYDROMORPHONE HCL 1 MG/ML IJ SOLN
0.5000 mg | Freq: Once | INTRAMUSCULAR | Status: AC
  Administered 2022-04-18: 0.5 mg via INTRAVENOUS
  Filled 2022-04-18: qty 0.5

## 2022-04-18 MED ORDER — OXYCODONE HCL 5 MG PO TABS
5.0000 mg | ORAL_TABLET | ORAL | Status: DC | PRN
  Administered 2022-04-18 (×2): 5 mg via ORAL
  Filled 2022-04-18 (×2): qty 1

## 2022-04-18 MED ORDER — ADULT MULTIVITAMIN W/MINERALS CH
1.0000 | ORAL_TABLET | Freq: Every day | ORAL | Status: DC
  Administered 2022-04-18 – 2022-04-21 (×4): 1 via ORAL
  Filled 2022-04-18 (×4): qty 1

## 2022-04-18 MED ORDER — POTASSIUM CHLORIDE CRYS ER 20 MEQ PO TBCR
20.0000 meq | EXTENDED_RELEASE_TABLET | Freq: Two times a day (BID) | ORAL | Status: AC
  Administered 2022-04-18 – 2022-04-19 (×4): 20 meq via ORAL
  Filled 2022-04-18 (×4): qty 1

## 2022-04-18 MED ORDER — PANTOPRAZOLE SODIUM 40 MG IV SOLR
40.0000 mg | Freq: Two times a day (BID) | INTRAVENOUS | Status: DC
  Administered 2022-04-18 – 2022-04-21 (×7): 40 mg via INTRAVENOUS
  Filled 2022-04-18 (×7): qty 10

## 2022-04-18 NOTE — Progress Notes (Signed)
PROGRESS NOTE    Kelli Hudson  [id number] DOB: 11-05-1990 DOA: 04/17/2022 PCP: Pcp, No    Brief Narrative:  32 year old with history of poorly controlled type 1 diabetes with gastroparesis, hypertension, drug-seeking behavior and multiple hospitalizations presents to the emergency room with another episode of persistent epigastric pain, intolerance to diet.  Unfortunately multiple hospitalizations.  3 ER visits in 2 weeks after discharge from the hospital.  Admitted due to significant findings.   Assessment & Plan:   Diabetic gastroparesis: Severe significant symptoms.  Recurrent issue.  Nausea medications, avoid narcotics especially Dilaudid. Patient desires to eat today, will start on clears.  Very slow advance. GI consulted.  Protonix, Reglan, Phenergan and Zofran.  Diabetes type 1, poorly controlled with hyperglycemia: Not on DKA.  Started on long-acting insulin and sliding scale insulin low doses as she does not have adequate intake.  Essential hypertension blood pressure stable on carvedilol and losartan.  Hyperlipidemia: On atorvastatin.  Hypokalemia: Replace.   DVT prophylaxis: SCDs Start: 04/17/22 1744   Code Status: Full code Family Communication: None at the bedside Disposition Plan: Status is: Observation The patient will require care spanning > 2 midnights and should be moved to inpatient because: Significant pain and intolerance to diet, IV fluids needed.     Consultants:  Gastroenterology  Procedures:  None  Antimicrobials:  None   Subjective: Patient seen and examined in the morning rounds.  She was pacing in the hallway.  When made for interview in the room "can you give me 1 dose of Dilaudid please".  I wrote her a nausea medicine and 1 dose of Dilaudid, patient came out and asked if she can have 2 doses.  She denies any vomiting today.  She wants to try some clears.  Objective: Vitals:   04/17/22 2103 04/17/22 2300 04/18/22 0300  04/18/22 0629  BP: (!) 176/119 (!) 174/108 (!) 160/116 (!) 170/106  Pulse: (!) 128 (!) 122 (!) 123 (!) 124  Resp: '17 20 20 18  '$ Temp: 99 F (37.2 C) 98.1 F (36.7 C) 98.6 F (37 C) 98.7 F (37.1 C)  TempSrc: Oral Oral Oral Oral  SpO2: 100% 100% 100% 100%  Weight:    77.9 kg    Intake/Output Summary (Last 24 hours) at 04/18/2022 1053 Last data filed at 04/18/2022 0400 Gross per 24 hour  Intake 1001.51 ml  Output --  Net 1001.51 ml   Filed Weights   04/18/22 0629  Weight: 77.9 kg    Examination:  General: Looks anxious on approach to interview.  On room air.  Flat affect.  Does not look in any obvious distress. Cardiovascular: S1-S2 normal.  Regular rate rhythm. Respiratory: Bilateral clear.  No added sounds. Gastrointestinal: Soft.  Very sensitive epigastric exam and does not let us examine her.  There is no rigidity. She has some voluntary guarding only. Ext: No deformities.  Walking in the hallway. Neuro: No focal neurological deficits.    Data Reviewed: I have personally reviewed following labs and imaging studies  CBC: Recent Labs  Lab 04/16/22 1332 04/17/22 1245 04/18/22 0647  WBC 8.3 8.3 13.7*  NEUTROABS  --  5.8  --   HGB 11.4* 11.7* 12.4  HCT 35.2* 34.9* 38.6  MCV 88.9 86.2 90.0  PLT 398 415* [id number]*   Basic Metabolic Panel: Recent Labs  Lab 04/16/22 1332 04/17/22 1245 04/18/22 0647  NA 134* 134* 137  K 4.2 3.3* 3.2*  CL 99 100 99  CO2 '27 22 24  '$ GLUCOSE 423* 257*  230*  BUN 20 22* 18  CREATININE 1.34* 1.49* 1.26*  CALCIUM 9.3 9.3 9.8   GFR: Estimated Creatinine Clearance: 71.6 mL/min (A) (by C-G formula based on SCr of 1.26 mg/dL (H)). Liver Function Tests: Recent Labs  Lab 04/16/22 1332 04/17/22 1245 04/18/22 0647  AST '22 29 24  '$ ALT '22 23 20  '$ ALKPHOS 92 76 76  BILITOT 0.9 1.2 1.1  PROT 7.3 7.6 8.0  ALBUMIN 3.6 3.4* 3.6   Recent Labs  Lab 04/16/22 1332 04/17/22 1245  LIPASE 42 29   No results for input(s): "AMMONIA" in the last 168  hours. Coagulation Profile: Recent Labs  Lab 04/18/22 0647  INR 1.0   Cardiac Enzymes: No results for input(s): "CKTOTAL", "CKMB", "CKMBINDEX", "TROPONINI" in the last 168 hours. BNP (last 3 results) No results for input(s): "PROBNP" in the last 8760 hours. HbA1C: No results for input(s): "HGBA1C" in the last 72 hours. CBG: Recent Labs  Lab 04/17/22 1220 04/17/22 1903 04/17/22 2059 04/18/22 0349 04/18/22 0743  GLUCAP 243* 162* 161* 209* 218*   Lipid Profile: No results for input(s): "CHOL", "HDL", "LDLCALC", "TRIG", "CHOLHDL", "LDLDIRECT" in the last 72 hours. Thyroid Function Tests: No results for input(s): "TSH", "T4TOTAL", "FREET4", "T3FREE", "THYROIDAB" in the last 72 hours. Anemia Panel: No results for input(s): "VITAMINB12", "FOLATE", "FERRITIN", "TIBC", "IRON", "RETICCTPCT" in the last 72 hours. Sepsis Labs: No results for input(s): "PROCALCITON", "LATICACIDVEN" in the last 168 hours.  Recent Results (from the past 240 hour(s))  Resp panel by RT-PCR (RSV, Flu A&B, Covid) Urine, Clean Catch     Status: None   Collection Time: 04/10/22 10:50 PM   Specimen: Urine, Clean Catch; Nasal Swab  Result Value Ref Range Status   SARS Coronavirus 2 by RT PCR NEGATIVE NEGATIVE Final    Comment: (NOTE) SARS-CoV-2 target nucleic acids are NOT DETECTED.  The SARS-CoV-2 RNA is generally detectable in upper respiratory specimens during the acute phase of infection. The lowest concentration of SARS-CoV-2 viral copies this assay can detect is 138 copies/mL. A negative result does not preclude SARS-Cov-2 infection and should not be used as the sole basis for treatment or other patient management decisions. A negative result may occur with  improper specimen collection/handling, submission of specimen other than nasopharyngeal swab, presence of viral mutation(s) within the areas targeted by this assay, and inadequate number of viral copies(<138 copies/mL). A negative result must be  combined with clinical observations, patient history, and epidemiological information. The expected result is Negative.  Fact Sheet for Patients:  EntrepreneurPulse.com.au  Fact Sheet for Healthcare Providers:  IncredibleEmployment.be  This test is no t yet approved or cleared by the [place] FDA and  has been authorized for detection and/or diagnosis of SARS-CoV-2 by FDA under an Emergency Use Authorization (EUA). This EUA will remain  in effect (meaning this test can be used) for the duration of the COVID-19 declaration under Section 564(b)(1) of the Act, 21 U.S.C.section 360bbb-3(b)(1), unless the authorization is terminated  or revoked sooner.       Influenza A by PCR NEGATIVE NEGATIVE Final   Influenza B by PCR NEGATIVE NEGATIVE Final    Comment: (NOTE) The Xpert Xpress SARS-CoV-2/FLU/RSV plus assay is intended as an aid in the diagnosis of influenza from Nasopharyngeal swab specimens and should not be used as a sole basis for treatment. Nasal washings and aspirates are unacceptable for Xpert Xpress SARS-CoV-2/FLU/RSV testing.  Fact Sheet for Patients: EntrepreneurPulse.com.au  Fact Sheet for Healthcare Providers: IncredibleEmployment.be  This test is not  yet approved or cleared by the [place] and has been authorized for detection and/or diagnosis of SARS-CoV-2 by FDA under an Emergency Use Authorization (EUA). This EUA will remain in effect (meaning this test can be used) for the duration of the COVID-19 declaration under Section 564(b)(1) of the Act, 21 U.S.C. section 360bbb-3(b)(1), unless the authorization is terminated or revoked.     Resp Syncytial Virus by PCR NEGATIVE NEGATIVE Final    Comment: (NOTE) Fact Sheet for Patients: EntrepreneurPulse.com.au  Fact Sheet for Healthcare Providers: IncredibleEmployment.be  This test is not yet  approved or cleared by the [place] FDA and has been authorized for detection and/or diagnosis of SARS-CoV-2 by FDA under an Emergency Use Authorization (EUA). This EUA will remain in effect (meaning this test can be used) for the duration of the COVID-19 declaration under Section 564(b)(1) of the Act, 21 U.S.C. section 360bbb-3(b)(1), unless the authorization is terminated or revoked.  Performed at [facility], [place] [address], [place], [place] [address]          Radiology Studies: No results found.      Scheduled Meds:  atorvastatin  20 mg Oral Daily   carvedilol  6.25 mg Oral BID   gabapentin  300 mg Oral TID   insulin aspart  0-15 Units Subcutaneous TID WC   insulin aspart  0-5 Units Subcutaneous QHS   insulin detemir  13 Units Subcutaneous BID   lidocaine  1 patch Transdermal Q24H   losartan  25 mg Oral Daily   potassium chloride  20 mEq Oral BID   scopolamine  1 patch Transdermal Q72H   sodium chloride flush  3 mL Intravenous Q12H   Continuous Infusions:  lactated ringers 125 mL/hr at 04/17/22 1935     LOS: 0 days    Time spent: 35 minutes    Barb Merino, MD [facility] Pager [phone]

## 2022-04-18 NOTE — Progress Notes (Signed)
Scheduled Carvedilol given   04/17/22 2103  Assess: MEWS Score  Temp 99 F (37.2 C)  BP (!) 176/119  MAP (mmHg) 134  Pulse Rate (!) 128  Resp 17  SpO2 100 %  O2 Device Room Air  Assess: MEWS Score  MEWS Temp 0  MEWS Systolic 0  MEWS Pulse 2  MEWS RR 0  MEWS LOC 0  MEWS Score 2  MEWS Score Color Yellow  Assess: if the MEWS score is Yellow or Red  Were vital signs taken at a resting state? Yes  Focused Assessment No change from prior assessment  Does the patient meet 2 or more of the SIRS criteria? No  Does the patient have a confirmed or suspected source of infection? No  MEWS guidelines implemented  Yes, yellow  Treat  MEWS Interventions Considered administering scheduled or prn medications/treatments as ordered  Take Vital Signs  Increase Vital Sign Frequency  Yellow: Q2hr x1, continue Q4hrs until patient remains green for 12hrs  Escalate  MEWS: Escalate Yellow: Discuss with charge nurse and consider notifying provider and/or RRT  Notify: Charge Nurse/RN  Name of Charge Nurse/RN Notified Doctor, general practice  Assess: SIRS CRITERIA  SIRS Temperature  0  SIRS Pulse 1  SIRS Respirations  0  SIRS WBC 0  SIRS Score Sum  1

## 2022-04-18 NOTE — Consult Note (Addendum)
Referring Provider: Dr. Barb Merino Primary Care Physician:  Pcp, No Primary Gastroenterologist:  Dr. Havery Moros  Reason for Consultation: Intractable nausea and vomiting, severe gastroparesis  HPI: Kelli Hudson is a 32 y.o. female with a past medical history of anxiety, hypertension, hypothyroidism, prolonged QT interval, diabetes mellitus type I with intractable nausea and vomiting secondary to diabetic gastroparesis and GERD. She has been admitted to the hospital numerous times over the past year secondary to intractable nausea and vomiting.  She is well-known by Dr. Havery Moros and our inpatient GI service with numerous hospital admissions for intractable nausea, vomiting secondary to gastroparesis. She was referred to Dr. Vickki Muff at [facility] in [place] 11/2021 for gastric pacemaker placement. She was reportedly seen by Dr. Alvera Singh  Nurse Practitioner 12/23/2021 with plans to schedule surgery for gastric pacemaker placement. However, the patient stated she had difficulty obtaining Medicaid coverage for this procedure and Dr. Alvera Singh office was facilitating authorization for the gastric pacemaker. At this juncture, she has not been scheduled for a gastric pacemaker placement. I contacted Dr. Alvera Singh office at this time but I was unable to speak to any medical provider who could provide an update regarding authorization for her gastric pacemaker placement.  She was recently admitted the hospital 2/2 - 03/25/2022 nausea, vomiting and epigastric pain.  CTAP showed circumferential wall thickening involving the distal esophagus, mild wall thickening involving the descending colon and rectum and pericolonic fat stranding or free fluid and prominent appendix up to 8 mm with gas throughout the lumen without periappendiceal fat stranding to suggest acute appendicitis.  She received IV fluids, IM Tigan (due to prolonged Qtc) and IV analgesics. Her QTc  normalized and she tolerated IV Reglan with symptoms control. Her diet was advanced  and was discharged home on Reglan 5 mg before every meal and at bedtime with instructions to continue Protonix 40 mg twice daily.  She was readmitted to the hospital 2/17 - 04/09/2022 for same symptoms with reported hematemesis and AKI.  WBC 10.8.  Hemoglobin 12.6.  BUN 27.  Creatinine 1.37.  Glucose 255.  AST 33.  ALT 48.  Total bili 0.7.  Alk phos 111.  Elevated d-dimer. CTA evidence of PE. CTAP showed emphysematous cystitis and small volume of gas within the urinary bladder without intra-abdominal pathology to explain her symptoms.  Her symptoms stabilized after she received IV fluids, insulin with glycemic control, antiemetics and analgesics.  Her diet was advanced and she was discharged home 04/09/2022.  She presented to the ED 04/10/2022 with nausea, vomiting and abdominal pain.  Hemoglobin 11.9.  Creatinine 1.17.  Glucose 348.  She was not in DKA.  She received IV fluids, antiemetics and pain medications and a clinical status significantly improved and she was discharged home.  She presented back to the ED 04/16/2022 with intractable nausea, vomiting hematemesis and abdominal pain.  WBC 8.3.  Hemoglobin 11.4.  Sodium 134.  Potassium 4.2.  Glucose 423.  BUN 20.  Creatinine 1.34 up from 1.17.  Normal LFTs.  Lipase 29. Beta HCG < 5.  EKG 2/29 with Qtc 444. EKG today with QTc 408. Repeat abdominal imaging was not done.   She had recurrent nausea and vomiting 2 days ago after she awakened and took her routine morning medications which she vomited up. She  was unable to keep any fluids down with persistent vomiting.  She described passing numerous episodes of brown-red hematemesis.  She is taking Pantoprazole 40 mg twice daily and Reglan 5  mg before every meal and at bedtime. When she is not having an active flare of gastroparesis, she is able to tolerate solid food without distress. She believes she is lost at least 10 pounds  over the past month.  She is having normal bowel movements, no rectal bleeding or black stools.   Her most recent EGD was done 02/14/2023 during a hospital admission at [facility] for nausea/vomiting with hematemesis which showed grade A esophagitis and mild gastritis. No evidence of a Mallory-Weiss tear.  Biopsies were negative for H. pylori. Her clinical status stabilized and she was discharged home 02/15/2022.  GI PROCEDURES:  EGD 02/14/2023 done due to hematemesis at [facility]: Impression:  LA grade a esophagitis was present  Mild gastritis. Gastric biopsies obtained using cold biopsy forceps.  Duodenal mucosa was normal to D2.  Stomach, biopsy: Fragments of unremarkable gastric mucosa. H. pylori-like organisms are not identified on routine stains  EGD 08/26/2021: - Normal larynx. - Normal esophagus. - Normal stomach. Clinical - Normal stomach. Clinical question of pyloric spasm - pylorus injected with botulinum toxin. - Normal examined duodenum. - No specimens collected.   EGD 01/24/2020: - Z-line regular, 37 cm from the incisors. - Gastritis. Biopsied. - Normal duodenal bulb, first portion of the duodenum and second portion of the Duodenum   EGD 09/08/2015: - LA Grade B esophagitis. - Gastritis. Biopsied. - Normal duodenal bulb, first portion of the duodenum and second portion of the duodenum. - Suspect gastritis and esophagitis are function of the patient's recurrent nausea and vomiting, and less convinced that they are in any way linked causally to her symptoms.  Past Medical History:  Diagnosis Date   Acute H. pylori gastric ulcer    Coffee ground emesis    Diabetes mellitus ([place])    Diabetic gastroparesis ([place])    DKA (diabetic ketoacidosis) ([place]) 02/24/2021   Gastroparesis    GERD (gastroesophageal reflux disease)    Hypertension    Hyperthyroidism    Intractable nausea and vomiting 04/20/2021   Normocytic anemia 04/20/2020   Prolonged Q-T interval  on ECG     Past Surgical History:  Procedure Laterality Date   AMPUTATION TOE Left 03/10/2018   Procedure: AMPUTATION FIFTH TOE;  Surgeon: Trula Slade, DPM;  Location: [facility];  Service: Podiatry;  Laterality: Left;   BIOPSY  01/28/2020   Procedure: BIOPSY;  Surgeon: Otis Brace, MD;  Location: [facility];  Service: Gastroenterology;;   BOTOX INJECTION  08/26/2021   Procedure: BOTOX INJECTION;  Surgeon: Doran Stabler, MD;  Location: [facility];  Service: Gastroenterology;;   ESOPHAGOGASTRODUODENOSCOPY N/A 01/28/2020   Procedure: ESOPHAGOGASTRODUODENOSCOPY (EGD);  Surgeon: Otis Brace, MD;  Location: [facility];  Service: Gastroenterology;  Laterality: N/A;   ESOPHAGOGASTRODUODENOSCOPY (EGD) WITH PROPOFOL Left 09/08/2015   Procedure: ESOPHAGOGASTRODUODENOSCOPY (EGD) WITH PROPOFOL;  Surgeon: Arta Silence, MD;  Location: [facility] END[facility]COPY;  Service: Endoscopy;  Laterality: Left;   ESOPHAGOGASTRODUODENOSCOPY (EGD) WITH PROPOFOL N/A 08/26/2021   Procedure: ESOPHAGOGASTRODUODENOSCOPY (EGD) WITH PROPOFOL;  Surgeon: Doran Stabler, MD;  Location: [facility];  Service: Gastroenterology;  Laterality: N/A;   WISDOM TOOTH EXTRACTION      Prior to Admission medications   Medication Sig Start Date End Date Taking? Authorizing Provider  atorvastatin (LIPITOR) 20 MG tablet Take 1 tablet (20 mg total) by mouth daily. 10/29/21  Yes [name], MD  carvedilol (COREG) 6.25 MG tablet Take 6.25 mg by mouth 2 (two) times daily. 03/22/22  Yes [provider]  gabapentin (NEURONTIN) 300 MG capsule Take 1 capsule (300 mg total) by mouth 3 (three) times daily. 12/30/21 04/17/22 Yes Barb Merino, MD  hydrOXYzine (VISTARIL) 25 MG capsule Take 1 capsule (25 mg total) by mouth every 8 (eight) hours as needed. Patient taking differently: Take 25 mg by mouth every 8 (eight) hours as needed for anxiety. 07/31/21  Yes Georgette Shell, MD  insulin detemir  (LEVEMIR FLEXTOUCH) 100 UNIT/ML FlexPen Inject 13 Units into the skin 2 (two) times daily. 03/25/22  Yes Mercy Riding, MD  insulin regular (NOVOLIN R) 100 units/mL injection Inject 0-0.15 mLs (0-15 Units total) into the skin See admin instructions. Inject 0-15 units into the skin three times a day with meals, per sliding scale 03/06/22  Yes Adhikari, Tamsen Meek, MD  losartan (COZAAR) 25 MG tablet Take 1 tablet (25 mg total) by mouth daily. 03/06/22  Yes Shelly Coss, MD  ondansetron (ZOFRAN) 4 MG tablet Take 4 mg by mouth every 8 (eight) hours as needed for nausea or vomiting.   Yes [provider]  pantoprazole (PROTONIX) 40 MG tablet Take 1 tablet (40 mg total) by mouth 2 (two) times daily before a meal. 11/08/21  Yes Hosie Poisson, MD  promethazine (PHENERGAN) 25 MG suppository Place 1 suppository (25 mg total) rectally every 6 (six) hours as needed for nausea or vomiting. 04/16/22  Yes Tretha Sciara, MD  promethazine (PHENERGAN) 25 MG suppository Place 1 suppository (25 mg total) rectally every 6 (six) hours as needed for nausea or vomiting. 04/17/22  Yes Redwine, Madison A, PA-C  Continuous Blood Gluc Transmit (DEXCOM G6 TRANSMITTER) MISC Change every 90 days 11/29/21   Shamleffer, Melanie Crazier, MD  insulin aspart (NOVOLOG) 100 UNIT/ML FlexPen Inject 5 Units into the skin 3 (three) times daily with meals. 02/23/18 07/05/19  Donne Hazel, MD    Current Facility-Administered Medications  Medication Dose Route Frequency Provider Last Rate Last Admin   acetaminophen (TYLENOL) tablet 650 mg  650 mg Oral Q6H PRN Karmen Bongo, MD   650 mg at 04/18/22 [id number]   Or   acetaminophen (TYLENOL) suppository 650 mg  650 mg Rectal Q6H PRN Karmen Bongo, MD   650 mg at 04/17/22 1941   atorvastatin (LIPITOR) tablet 20 mg  20 mg Oral Daily Karmen Bongo, MD   20 mg at 04/18/22 [id number]   carvedilol (COREG) tablet 6.25 mg  6.25 mg Oral BID Karmen Bongo, MD   6.25 mg at 04/18/22 0818   gabapentin  (NEURONTIN) capsule 300 mg  300 mg Oral TID Karmen Bongo, MD   300 mg at 04/18/22 0818   hydrALAZINE (APRESOLINE) injection 5 mg  5 mg Intravenous Q4H PRN Karmen Bongo, MD       hydrOXYzine (ATARAX) tablet 25 mg  25 mg Oral TID PRN Karmen Bongo, MD   25 mg at 04/18/22 0818   insulin aspart (novoLOG) injection 0-15 Units  0-15 Units Subcutaneous TID WC Karmen Bongo, MD   5 Units at 04/18/22 [id number]   insulin aspart (novoLOG) injection 0-5 Units  0-5 Units Subcutaneous QHS Karmen Bongo, MD       insulin detemir (LEVEMIR) injection 13 Units  13 Units Subcutaneous BID Karmen Bongo, MD   13 Units at 04/17/22 2110   lactated ringers infusion   Intravenous Continuous Karmen Bongo, MD 125 mL/hr at 04/17/22 1935 New Bag at 04/17/22 1935   lidocaine (LIDODERM) 5 % 1 patch  1 patch Transdermal Q24H Raenette Rover, NP   1 patch at 04/17/22 2332  losartan (COZAAR) tablet 25 mg  25 mg Oral Daily Karmen Bongo, MD   25 mg at 04/18/22 0818   melatonin tablet 10 mg  10 mg Oral QHS PRN Raenette Rover, NP   10 mg at 04/17/22 2207   ondansetron (ZOFRAN) tablet 4 mg  4 mg Oral Q6H PRN Karmen Bongo, MD       Or   ondansetron [facility]) injection 4 mg  4 mg Intravenous Q6H PRN Karmen Bongo, MD   4 mg at 04/18/22 [id number]   promethazine (PHENERGAN) suppository 25 mg  25 mg Rectal Q6H PRN Karmen Bongo, MD       scopolamine (TRANSDERM-SCOP) 1 MG/3DAYS 1.5 mg  1 patch Transdermal Kyra Searles, MD   1.5 mg at 04/17/22 1408   sodium chloride flush (NS) 0.9 % injection 3 mL  3 mL Intravenous Q12H Karmen Bongo, MD   3 mL at 04/18/22 0854    Allergies as of 04/17/2022 - Review Complete 04/17/2022  Allergen Reaction Noted   Lisinopril Cough 02/16/2022    Family History  Problem Relation Age of Onset   Lung cancer Mother    Diabetes Mother    Bipolar disorder Father    Liver cancer Maternal Grandfather    Diabetes Maternal Aunt        x 2   Pancreatic cancer Maternal Uncle     Prostate cancer Maternal Uncle    Breast cancer Other        maternal great aunt   Heart disease Other    Ovarian cancer Other        maternal great aunt   Kidney disease Other        maternal great aunt   Colon cancer Neg Hx    Stomach cancer Neg Hx     Social History   Socioeconomic History   Marital status: Single    Spouse name: Not on file   Number of children: 0   Years of education: Not on file   Highest education level: Not on file  Occupational History   Occupation: Estate manager/land agent  Tobacco Use   Smoking status: Never   Smokeless tobacco: Never  Vaping Use   Vaping Use: Never used  Substance and Sexual Activity   Alcohol use: Yes    Alcohol/week: 1.0 standard drink of alcohol    Types: 1 Shots of liquor per week    Comment: occasional    Drug use: No   Sexual activity: Yes    Birth control/protection: None, Condom    Comment: nexiplanon  Other Topics Concern   Not on file  Social History Narrative   Not on file   Social Determinants of Health   Financial Resource Strain: Low Risk  (05/29/2021)   Overall Financial Resource Strain (CARDIA)    Difficulty of Paying Living Expenses: Not very hard  Food Insecurity: No Food Insecurity (04/18/2022)   Hunger Vital Sign    Worried About Running Out of Food in the Last Year: Never true    Ran Out of Food in the Last Year: Never true  Transportation Needs: No Transportation Needs (04/18/2022)   PRAPARE - Hydrologist (Medical): No    Lack of Transportation (Non-Medical): No  Physical Activity: Not on file  Stress: Not on file  Social Connections: Moderately Integrated (05/29/2021)   Social Connection and Isolation Panel [NHANES]    Frequency of Communication with Friends and Family: More than three times a week  Frequency of Social Gatherings with Friends and Family: More than three times a week    Attends Religious Services: More than 4 times per year    Active Member of Clubs or  Organizations: Yes    Attends Archivist Meetings: More than 4 times per year    Marital Status: Never married  Intimate Partner Violence: Not At Risk (04/18/2022)   Humiliation, Afraid, Rape, and Kick questionnaire    Fear of Current or Ex-Partner: No    Emotionally Abused: No    Physically Abused: No    Sexually Abused: No    Review of Systems: See HPI, all other systems reviewed and were negative   Physical Exam: Vital signs in last 24 hours: Temp:  [98 F (36.7 C)-99.7 F (37.6 C)] 98.7 F (37.1 C) (03/01 0629) Pulse Rate:  [104-130] 124 (03/01 0629) Resp:  [14-28] 18 (03/01 0629) BP: (159-189)/(100-127) 170/106 (03/01 0629) SpO2:  [91 %-100 %] 100 % (03/01 0629) Weight:  [77.9 kg] 77.9 kg (03/01 0629) Last BM Date : 04/06/22 General: Alert fatigued appearing 32 year old female in no acute distress. Head:  Normocephalic and atraumatic. Eyes:  No scleral icterus. Conjunctiva pink. Ears:  Normal auditory acuity. Nose:  No deformity, discharge or lesions. Mouth:  Dentition intact. No ulcers or lesions.  Neck:  Supple. No lymphadenopathy or thyromegaly.  Lungs: Breath sounds clear throughout. No wheezes, rhonchi or crackles.  Heart: Regular rate and rhythm, no murmurs. Abdomen:, Nondistended.  Moderate tenderness throughout the entire abdomen without rebound or guarding.  Positive bowel sounds to all 4 quadrants. Rectal: Deferred. Musculoskeletal:  Symmetrical without gross deformities.  Pulses:  Normal pulses noted. Extremities:  Without clubbing or edema. Neurologic:  Alert and  oriented x 4. No focal deficits.  Skin:  Intact without significant lesions or rashes. Psych:  Alert and cooperative. Normal mood and affect.  Intake/Output from previous day: 02/29 0701 - 03/01 0700 In: 1001.5 [I.V.:1.5; IV Piggyback:1000] Out: -  Intake/Output this shift: No intake/output data recorded.  Lab Results: Recent Labs    04/16/22 1332 04/17/22 1245 04/18/22 0647   WBC 8.3 8.3 13.7*  HGB 11.4* 11.7* 12.4  HCT 35.2* 34.9* 38.6  PLT 398 415* 431*   BMET Recent Labs    04/16/22 1332 04/17/22 1245 04/18/22 0647  NA 134* 134* 137  K 4.2 3.3* 3.2*  CL 99 100 99  CO2 '27 22 24  '$ GLUCOSE 423* 257* 230*  BUN 20 22* 18  CREATININE 1.34* 1.49* 1.26*  CALCIUM 9.3 9.3 9.8   LFT Recent Labs    04/18/22 0647  PROT 8.0  ALBUMIN 3.6  AST 24  ALT 20  ALKPHOS 76  BILITOT 1.1   PT/INR Recent Labs    04/18/22 0647  LABPROT 13.5  INR 1.0    IMPRESSION/PLAN:  32 year old female with DM type I with severe gastroparesis re-admitted to the hospital with intractable nausea, vomiting with reported hematemesis.  Antiemetic regimen challenging secondary to prolonged QTc interval.  EGD [facility]/13/2022 at [facility] showed Grade A esophagitis and gastritis without evidence of a Mallory-Weiss tear. Patient seen by gastroparesis specialist Dr. Alvera Singh NP 12/2021 with plans for gastric pacemaker placement, Medicaid authorization pending.  -Clear liquid diet -Continue IV fluid at 125 cc/hour -Ondansetron 4 mg IV every 6 hours as needed -Check EKG, QTc interval daily -Promethazine 25 mg suppository 1 PR every 6 hours as needed -Continue Scopolamine transdermal patch -Pantoprazole 40 mg IV twice daily -No plan for repeat EGD  at this time -CBC and BMP in a.m. -Patient to follow-up with Dr. Vickki Muff for gastric pacemaker placement.  I attempted to speak with Dr. Vickki Muff or his NP today to expedite the process for gastric pacemaker placement and to assess status of Medicaid authorization but was unable to speak speak to them at this time. -Further recommendations per Dr. Fuller Plan  Leukocytosis.  WBC 13.7.  Afebrile. -? Repeat abdominal imaging if abdominal pain worsens  Hypokalemia. K+ 3.2.  -KCl replacement per the hospitalist   Noralyn Pick  04/18/2022, 11:16AM    Attending Physician Note   I have taken a history, reviewed the chart and examined  the patient. I performed a substantive portion of this encounter, including complete performance of at least one of the key components, in conjunction with the APP. I agree with the APP's note, impression and recommendations with my edits.   Severe gastroparesis, intractable nausea, vomiting and poorly controlled DM type I. EGD in Dec 2023 with LA Grade A esophagitis, gastritis and a MW tear. No plans to repeat EGD at this time. Recomomendations as outlined above.   Lucio Edward, MD [facility] See AMION, [facility], for our on call provider

## 2022-04-18 NOTE — Progress Notes (Signed)
Initial Nutrition Assessment  DOCUMENTATION CODES:   Non-severe (moderate) malnutrition in context of chronic illness  INTERVENTION:   -Multivitamin with minerals daily  -Declines nutritional supplements at this time  -Will place "Gastroparesis" handout in AVS  NUTRITION DIAGNOSIS:   Moderate Malnutrition related to chronic illness (gastroparesis) as evidenced by percent weight loss, mild fat depletion.  GOAL:   Patient will meet greater than or equal to 90% of their needs  MONITOR:   PO intake, Diet advancement, Labs, Weight trends, I & O's, Skin  REASON FOR ASSESSMENT:   Consult Assessment of nutrition requirement/status  ASSESSMENT:   32 year old with history of poorly controlled type 1 diabetes with gastroparesis, hypertension, drug-seeking behavior and multiple hospitalizations presents to the emergency room with another episode of persistent epigastric pain, intolerance to diet.  Patient in room, sleeping. Pretty lethargic but was able to give some history. States she last ate 2 days ago when her gastroparesis symptoms started. Pt currently on clears, has had some broth so far today.  At home, pt has been eating some fruit in the morning and for lunch or dinner she will have some chicken. Did not elaborate further on meals. Not eating as consistently as she needs for better blood sugar control. She attributes this to not having an appetite.  She declines all protein supplement options stating that even the low carbohydrate options make her blood sugar go up. Will order daily MVI.  Pt has had multiple educations in the past on gastroparesis diet. Will place handout in AVS.   Per weight records, pt has lost 14 lbs since 02/16/22 (7% wt loss x 3 months, significant for time frame).  Medications: KLOR-CON, Lactated ringers, Zofran  Labs reviewed:  CBGs: 161-226 Low K  NUTRITION - FOCUSED PHYSICAL EXAM:  Flowsheet Row Most Recent Value  Orbital Region No depletion   Upper Arm Region Mild depletion  Thoracic and Lumbar Region Mild depletion  Buccal Region No depletion  Temple Region Mild depletion  Clavicle Bone Region No depletion  Clavicle and Acromion Bone Region No depletion  Scapular Bone Region No depletion  Dorsal Hand No depletion  Patellar Region No depletion  Anterior Thigh Region No depletion  Posterior Calf Region No depletion  Edema (RD Assessment) None  Hair Reviewed  Eyes Reviewed  Mouth Reviewed  Skin Reviewed       Diet Order:   Diet Order             Diet clear liquid Fluid consistency: Thin  Diet effective now                   EDUCATION NEEDS:   No education needs have been identified at this time  Skin:  Skin Assessment: Skin Integrity Issues: Skin Integrity Issues:: Diabetic Ulcer Diabetic Ulcer: bilateral ankles, pretibial  Last BM:  PTA  Height:   Ht Readings from Last 1 Encounters:  04/16/22 '5\' 8"'$  (1.727 m)    Weight:   Wt Readings from Last 1 Encounters:  04/18/22 77.9 kg    BMI:  Body mass index is 26.11 kg/m.  Estimated Nutritional Needs:   Kcal:  1800-2000  Protein:  85-95g  Fluid:  2L/day  Clayton Bibles, MS, RD, LDN Inpatient Clinical Dietitian Contact information available via Amion

## 2022-04-18 NOTE — TOC Progression Note (Signed)
Transition of Care [facility]) - Progression Note    Patient Details  Name: Shamell Ferrare MRN: [id number] Date of Birth: Feb 22, 1990  Transition of Care [facility]) CM/SW [place], RN Phone [phone]  04/18/2022, 1:46 PM  Clinical Narrative:     Transition of Care (TOC) Screening Note   Patient Details  Name: Aily Gotto Date of Birth: February 20, 1990   Transition of Care [facility]) CM/SW Contact:    Angelita Ingles, RN Phone Number: 04/18/2022, 1:47 PM    Transition of Care Department [facility]) has reviewed patient and no TOC needs have been identified at this time. We will continue to monitor patient advancement through interdisciplinary progression rounds. If new patient transition needs arise, please place a TOC consult.           Expected Discharge Plan and Services                                               Social Determinants of Health (SDOH) Interventions SDOH Screenings   Food Insecurity: No Food Insecurity (04/18/2022)  Housing: Low Risk  (04/18/2022)  Transportation Needs: No Transportation Needs (04/18/2022)  Utilities: Not At Risk (04/18/2022)  Alcohol Screen: Low Risk  (05/29/2021)  Depression (PHQ2-9): Low Risk  (05/29/2021)  Recent Concern: Depression (PHQ2-9) - Medium Risk (04/10/2021)  Financial Resource Strain: Low Risk  (05/29/2021)  Social Connections: Moderately Integrated (05/29/2021)  Tobacco Use: Low Risk  (04/17/2022)    Readmission Risk Interventions    03/24/2022    3:33 PM 12/12/2021    8:59 AM 12/04/2021    9:52 AM  Readmission Risk Prevention Plan  Transportation Screening Complete Complete Complete  Medication Review Press photographer) Complete Complete Complete  PCP or Specialist appointment within 3-5 days of discharge Complete Complete Complete  HRI or Home Care Consult Complete Complete Complete  SW Recovery Care/Counseling Consult Complete Complete Complete  Palliative Care Screening Not Applicable Not  Applicable Not [place] Not Applicable Not Applicable Not Applicable

## 2022-04-18 NOTE — ED Notes (Addendum)
Patient left her room in the emergency department and let herself into the ED provider room to speak with provider. She has also tried to take a syringe from her nurses hand to give herself medication at a much faster rate than the nurse would push it. I have had a conversation with this patient how both of thee behaviors are inappropriate and should never occur. I have instructed her that I she attempts to enter the provide room again, she will be considered agressive/threatening behavior and [employer]/security will be called to escort her out. Patient verbalized understanding and apologized. Christiana, [place] also made aware of patient's high BP, she came to room to see patient. Patient stated she had not taken her BP meds due to vomiting. She also did not pick up her RX for rectal phenergan as she states her insurance would not pay for it and she cannot afford to pay out of pocket.

## 2022-04-19 DIAGNOSIS — R112 Nausea with vomiting, unspecified: Secondary | ICD-10-CM | POA: Diagnosis not present

## 2022-04-19 DIAGNOSIS — E1022 Type 1 diabetes mellitus with diabetic chronic kidney disease: Secondary | ICD-10-CM | POA: Diagnosis not present

## 2022-04-19 DIAGNOSIS — N182 Chronic kidney disease, stage 2 (mild): Secondary | ICD-10-CM | POA: Diagnosis not present

## 2022-04-19 DIAGNOSIS — K3184 Gastroparesis: Secondary | ICD-10-CM | POA: Diagnosis not present

## 2022-04-19 LAB — GLUCOSE, CAPILLARY
Glucose-Capillary: 178 mg/dL — ABNORMAL HIGH (ref 70–99)
Glucose-Capillary: 113 mg/dL — ABNORMAL HIGH (ref 70–99)
Glucose-Capillary: 153 mg/dL — ABNORMAL HIGH (ref 70–99)
Glucose-Capillary: 160 mg/dL — ABNORMAL HIGH (ref 70–99)

## 2022-04-19 MED ORDER — LABETALOL HCL 5 MG/ML IV SOLN
10.0000 mg | INTRAVENOUS | Status: AC | PRN
  Administered 2022-04-19 (×2): 10 mg via INTRAVENOUS
  Filled 2022-04-19 (×4): qty 4

## 2022-04-19 MED ORDER — TRAMADOL HCL 50 MG PO TABS
50.0000 mg | ORAL_TABLET | Freq: Four times a day (QID) | ORAL | Status: DC | PRN
  Administered 2022-04-19 (×2): 50 mg via ORAL
  Filled 2022-04-19 (×2): qty 1

## 2022-04-19 MED ORDER — HYDROMORPHONE HCL 1 MG/ML IJ SOLN
0.5000 mg | Freq: Once | INTRAMUSCULAR | Status: AC
  Administered 2022-04-19: 0.5 mg via INTRAVENOUS
  Filled 2022-04-19: qty 0.5

## 2022-04-19 NOTE — Progress Notes (Signed)
   04/18/22 2226  Assess: MEWS Score  Temp 99 F (37.2 C)  BP (!) 157/109  MAP (mmHg) 122  ECG Heart Rate (!) 113  Assess: MEWS Score  MEWS Temp 0  MEWS Systolic 0  MEWS Pulse 2  MEWS RR 0  MEWS LOC 0  MEWS Score 2  MEWS Score Color Yellow  Assess: if the MEWS score is Yellow or Red  Were vital signs taken at a resting state? Yes  Focused Assessment No change from prior assessment  Does the patient meet 2 or more of the SIRS criteria? No  Does the patient have a confirmed or suspected source of infection? No  MEWS guidelines implemented  Yes, yellow  Treat  MEWS Interventions Considered administering scheduled or prn medications/treatments as ordered  Take Vital Signs  Increase Vital Sign Frequency  Yellow: Q2hr x1, continue Q4hrs until patient remains green for 12hrs  Escalate  MEWS: Escalate Yellow: Discuss with charge nurse and consider notifying provider and/or RRT  Notify: Charge Nurse/RN  Name of Charge Nurse/RN Notified Hildred Alamin, RN  Provider Notification  Provider Name/Title Zebedee Iba, NP  Date Provider Notified 04/18/22  Time Provider Notified 2236  Method of Notification Page (secure message)  Notification Reason Other (Comment)  Provider response No new orders  Date of Provider Response 04/18/22  Time of Provider Response 2240  Assess: SIRS CRITERIA  SIRS Temperature  0  SIRS Pulse 1  SIRS Respirations  0  SIRS WBC 0  SIRS Score Sum  1

## 2022-04-19 NOTE — Progress Notes (Signed)
PROGRESS NOTE    Kelli Hudson  [id number] DOB: 1990/08/11 DOA: 04/17/2022 PCP: Pcp, No    Brief Narrative:  32 year old with history of poorly controlled type 1 diabetes with gastroparesis, hypertension, drug-seeking behavior and multiple hospitalizations presents to the emergency room with another episode of persistent epigastric pain, intolerance to diet.  Unfortunately multiple hospitalizations.  3 ER visits in 2 weeks after discharge from the hospital.  Admitted due to significant findings.   Assessment & Plan:   Diabetic gastroparesis: Persistent severe significant symptoms.  Recurrent issue.  Nausea medications, avoid narcotics especially Dilaudid. Continue clears today. GI consulted.  Protonix, Reglan, Phenergan and Zofran.  Diabetes type 1, poorly controlled with hyperglycemia: Not on DKA.  Started on long-acting insulin and sliding scale insulin low doses as she does not have adequate intake.  Essential hypertension blood pressure stable on carvedilol and losartan.  Hyperlipidemia: On atorvastatin.  Hypokalemia: Replaced.  Recheck level tomorrow.   DVT prophylaxis: SCDs Start: 04/17/22 1744   Code Status: Full code Family Communication: None at the bedside Disposition Plan: Status is: Inpatient.  Remains inpatient because of not tolerating diet.   Consultants:  Gastroenterology  Procedures:  None  Antimicrobials:  None   Subjective:  Patient seen and examined.  Wanting to have 1 dose of Dilaudid today.  She wants to try some tramadol instead of oxycodone.  Patient tells me she is having mostly pain then nausea.  Tolerating liquids. Flat affect and difficult to assess.  Objective: Vitals:   04/18/22 2347 04/19/22 0058 04/19/22 0307 04/19/22 0632  BP: (!) 168/120 (!) 148/125 (!) 159/111 (!) 167/119  Pulse: (!) 110 (!) 113 (!) 110 (!) 124  Resp:  '16 16 18  '$ Temp: 98.8 F (37.1 C)  99 F (37.2 C) 99.1 F (37.3 C)  TempSrc: Oral  Oral Oral   SpO2: 100% 100% 100% 100%  Weight:        Intake/Output Summary (Last 24 hours) at 04/19/2022 1044 Last data filed at 04/19/2022 0804 Gross per 24 hour  Intake 2436.74 ml  Output 750 ml  Net 1686.74 ml    Filed Weights   04/18/22 0629  Weight: 77.9 kg    Examination:  General: Anxious with flat affect.  Does not look like she is in any discomfort today. Cardiovascular: S1-S2 normal.  Regular rate rhythm. Respiratory: Bilateral clear.  No added sounds. Gastrointestinal: Soft.  Very sensitive epigastric exam and does not let us examine her.  There is no rigidity. She has some voluntary guarding only. Ext: No deformities.  Walking in the hallway. Neuro: No focal neurological deficits.    Data Reviewed: I have personally reviewed following labs and imaging studies  CBC: Recent Labs  Lab 04/16/22 1332 04/17/22 1245 04/18/22 0647  WBC 8.3 8.3 13.7*  NEUTROABS  --  5.8  --   HGB 11.4* 11.7* 12.4  HCT 35.2* 34.9* 38.6  MCV 88.9 86.2 90.0  PLT 398 415* 431*    Basic Metabolic Panel: Recent Labs  Lab 04/16/22 1332 04/17/22 1245 04/18/22 0647  NA 134* 134* 137  K 4.2 3.3* 3.2*  CL 99 100 99  CO2 '27 22 24  '$ GLUCOSE 423* 257* 230*  BUN 20 22* 18  CREATININE 1.34* 1.49* 1.26*  CALCIUM 9.3 9.3 9.8    GFR: Estimated Creatinine Clearance: 71.6 mL/min (A) (by C-G formula based on SCr of 1.26 mg/dL (H)). Liver Function Tests: Recent Labs  Lab 04/16/22 1332 04/17/22 1245 04/18/22 0647  AST 22 29 24  ALT '22 23 20  '$ ALKPHOS 92 76 76  BILITOT 0.9 1.2 1.1  PROT 7.3 7.6 8.0  ALBUMIN 3.6 3.4* 3.6    Recent Labs  Lab 04/16/22 1332 04/17/22 1245  LIPASE 42 29    No results for input(s): "AMMONIA" in the last 168 hours. Coagulation Profile: Recent Labs  Lab 04/18/22 0647  INR 1.0    Cardiac Enzymes: No results for input(s): "CKTOTAL", "CKMB", "CKMBINDEX", "TROPONINI" in the last 168 hours. BNP (last 3 results) No results for input(s): "PROBNP" in the last  8760 hours. HbA1C: No results for input(s): "HGBA1C" in the last 72 hours. CBG: Recent Labs  Lab 04/18/22 0743 04/18/22 1155 04/18/22 1656 04/18/22 2105 04/19/22 0741  GLUCAP 218* 226* 127* 165* 178*    Lipid Profile: No results for input(s): "CHOL", "HDL", "LDLCALC", "TRIG", "CHOLHDL", "LDLDIRECT" in the last 72 hours. Thyroid Function Tests: No results for input(s): "TSH", "T4TOTAL", "FREET4", "T3FREE", "THYROIDAB" in the last 72 hours. Anemia Panel: No results for input(s): "VITAMINB12", "FOLATE", "FERRITIN", "TIBC", "IRON", "RETICCTPCT" in the last 72 hours. Sepsis Labs: No results for input(s): "PROCALCITON", "LATICACIDVEN" in the last 168 hours.  Recent Results (from the past 240 hour(s))  Resp panel by RT-PCR (RSV, Flu A&B, Covid) Urine, Clean Catch     Status: None   Collection Time: 04/10/22 10:50 PM   Specimen: Urine, Clean Catch; Nasal Swab  Result Value Ref Range Status   SARS Coronavirus 2 by RT PCR NEGATIVE NEGATIVE Final    Comment: (NOTE) SARS-CoV-2 target nucleic acids are NOT DETECTED.  The SARS-CoV-2 RNA is generally detectable in upper respiratory specimens during the acute phase of infection. The lowest concentration of SARS-CoV-2 viral copies this assay can detect is 138 copies/mL. A negative result does not preclude SARS-Cov-2 infection and should not be used as the sole basis for treatment or other patient management decisions. A negative result may occur with  improper specimen collection/handling, submission of specimen other than nasopharyngeal swab, presence of viral mutation(s) within the areas targeted by this assay, and inadequate number of viral copies(<138 copies/mL). A negative result must be combined with clinical observations, patient history, and epidemiological information. The expected result is Negative.  Fact Sheet for Patients:  EntrepreneurPulse.com.au  Fact Sheet for Healthcare Providers:   IncredibleEmployment.be  This test is no t yet approved or cleared by the [place] FDA and  has been authorized for detection and/or diagnosis of SARS-CoV-2 by FDA under an Emergency Use Authorization (EUA). This EUA will remain  in effect (meaning this test can be used) for the duration of the COVID-19 declaration under Section 564(b)(1) of the Act, 21 U.S.C.section 360bbb-3(b)(1), unless the authorization is terminated  or revoked sooner.       Influenza A by PCR NEGATIVE NEGATIVE Final   Influenza B by PCR NEGATIVE NEGATIVE Final    Comment: (NOTE) The Xpert Xpress SARS-CoV-2/FLU/RSV plus assay is intended as an aid in the diagnosis of influenza from Nasopharyngeal swab specimens and should not be used as a sole basis for treatment. Nasal washings and aspirates are unacceptable for Xpert Xpress SARS-CoV-2/FLU/RSV testing.  Fact Sheet for Patients: EntrepreneurPulse.com.au  Fact Sheet for Healthcare Providers: IncredibleEmployment.be  This test is not yet approved or cleared by the [place] FDA and has been authorized for detection and/or diagnosis of SARS-CoV-2 by FDA under an Emergency Use Authorization (EUA). This EUA will remain in effect (meaning this test can be used) for the duration of the COVID-19 declaration under Section 564(b)(1) of  the Act, 21 U.S.C. section 360bbb-3(b)(1), unless the authorization is terminated or revoked.     Resp Syncytial Virus by PCR NEGATIVE NEGATIVE Final    Comment: (NOTE) Fact Sheet for Patients: EntrepreneurPulse.com.au  Fact Sheet for Healthcare Providers: IncredibleEmployment.be  This test is not yet approved or cleared by the [place] FDA and has been authorized for detection and/or diagnosis of SARS-CoV-2 by FDA under an Emergency Use Authorization (EUA). This EUA will remain in effect (meaning this test can be used) for  the duration of the COVID-19 declaration under Section 564(b)(1) of the Act, 21 U.S.C. section 360bbb-3(b)(1), unless the authorization is terminated or revoked.  Performed at [facility], [place] [address], [place], [place] [address]          Radiology Studies: No results found.      Scheduled Meds:  atorvastatin  20 mg Oral Daily   carvedilol  6.25 mg Oral BID   gabapentin  300 mg Oral TID   insulin aspart  0-15 Units Subcutaneous TID WC   insulin aspart  0-5 Units Subcutaneous QHS   insulin detemir  13 Units Subcutaneous BID   lidocaine  1 patch Transdermal Q24H   losartan  25 mg Oral Daily   multivitamin with minerals  1 tablet Oral Daily   pantoprazole (PROTONIX) IV  40 mg Intravenous Q12H   potassium chloride  20 mEq Oral BID   scopolamine  1 patch Transdermal Q72H   sodium chloride flush  3 mL Intravenous Q12H   Continuous Infusions:  lactated ringers 125 mL/hr at 04/19/22 0804     LOS: 1 day    Time spent: 35 minutes    Barb Merino, MD [facility] Pager [phone]

## 2022-04-19 NOTE — Progress Notes (Signed)
[facility] Subjective: Patient was started on clears but still has vomited twice today-small volume emesis. Multiple medications do not seem to be helping. Seems very distressed and not interested in having a conversation. When I questioned whether she had tried Domperidone she said her insurance would not cover it. She has not had a BM for 5 days.   Objective: Vital signs in last 24 hours: Temp:  [97.3 F (36.3 C)-99.1 F (37.3 C)] 99.1 F (37.3 C) (03/02 [id number]) Pulse Rate:  [109-124] 124 (03/02 0632) Resp:  [16-20] 18 (03/02 [id number]) BP: (146-171)/(96-125) 167/119 (03/02 [id number]) SpO2:  [99 %-100 %] 100 % (03/02 [id number]) Last BM Date : 04/18/22  Intake/Output from previous day: 03/01 0701 - 03/02 0700 In: -  Out: 750 [Emesis/NG output:750] Intake/Output this shift: No intake/output data recorded.  General appearance: alert, cooperative, appears stated age, fatigued, and mild distress Resp: clear to auscultation bilaterally Cardio: regular rate and rhythm, S1, S2 normal, no murmur, click, rub or gallop GI: soft with epigastric tenderness on palpation with gaurding but without rebound or rigidity; hypoactive bowel sounds no masses,  no organomegaly  Lab Results: Recent Labs    04/16/22 1332 04/17/22 1245 04/18/22 0647  WBC 8.3 8.3 13.7*  HGB 11.4* 11.7* 12.4  HCT 35.2* 34.9* 38.6  PLT 398 415* 431*   BMET Recent Labs    04/16/22 1332 04/17/22 1245 04/18/22 0647  NA 134* 134* 137  K 4.2 3.3* 3.2*  CL 99 100 99  CO2 '27 22 24  '$ GLUCOSE 423* 257* 230*  BUN 20 22* 18  CREATININE 1.34* 1.49* 1.26*  CALCIUM 9.3 9.3 9.8   LFT Recent Labs    04/18/22 0647  PROT 8.0  ALBUMIN 3.6  AST 24  ALT 20  ALKPHOS 76  BILITOT 1.1   PT/INR Recent Labs    04/18/22 0647  LABPROT 13.5  INR 1.0   Medications: I have reviewed the patient's current medications.  Prior to Admission:  Medications Prior to Admission  Medication Sig Dispense Refill Last Dose    atorvastatin (LIPITOR) 20 MG tablet Take 1 tablet (20 mg total) by mouth daily. 90 tablet 2 04/17/2022   carvedilol (COREG) 6.25 MG tablet Take 6.25 mg by mouth 2 (two) times daily.   04/17/2022 at 0800   gabapentin (NEURONTIN) 300 MG capsule Take 1 capsule (300 mg total) by mouth 3 (three) times daily. 90 capsule 2 04/17/2022   hydrOXYzine (VISTARIL) 25 MG capsule Take 1 capsule (25 mg total) by mouth every 8 (eight) hours as needed. (Patient taking differently: Take 25 mg by mouth every 8 (eight) hours as needed for anxiety.) 30 capsule 0 04/17/2022   insulin detemir (LEVEMIR FLEXTOUCH) 100 UNIT/ML FlexPen Inject 13 Units into the skin 2 (two) times daily. 10 mL 2 04/17/2022   insulin regular (NOVOLIN R) 100 units/mL injection Inject 0-0.15 mLs (0-15 Units total) into the skin See admin instructions. Inject 0-15 units into the skin three times a day with meals, per sliding scale 10 mL 1 04/17/2022   losartan (COZAAR) 25 MG tablet Take 1 tablet (25 mg total) by mouth daily. 30 tablet 0 04/17/2022   ondansetron (ZOFRAN) 4 MG tablet Take 4 mg by mouth every 8 (eight) hours as needed for nausea or vomiting.   04/17/2022   pantoprazole (PROTONIX) 40 MG tablet Take 1 tablet (40 mg total) by mouth 2 (two) times daily before a meal. 60 tablet 5 04/17/2022   promethazine (PHENERGAN) 25 MG suppository Place  1 suppository (25 mg total) rectally every 6 (six) hours as needed for nausea or vomiting. 12 each 0 Unk   Continuous Blood Gluc Transmit (DEXCOM G6 TRANSMITTER) MISC Change every 90 days 1 each 3    Scheduled:  atorvastatin  20 mg Oral Daily   carvedilol  6.25 mg Oral BID   gabapentin  300 mg Oral TID   insulin aspart  0-15 Units Subcutaneous TID WC   insulin aspart  0-5 Units Subcutaneous QHS   insulin detemir  13 Units Subcutaneous BID   lidocaine  1 patch Transdermal Q24H   losartan  25 mg Oral Daily   multivitamin with minerals  1 tablet Oral Daily   pantoprazole (PROTONIX) IV  40 mg Intravenous Q12H    potassium chloride  20 mEq Oral BID   scopolamine  1 patch Transdermal Q72H   sodium chloride flush  3 mL Intravenous Q12H   Continuous:  lactated ringers 125 mL/hr at 04/19/22 1645   [id number] **OR** acetaminophen, hydrALAZINE, hydrOXYzine, melatonin, ondansetron **OR** ondansetron (ZOFRAN) IV, promethazine, traMADol  Assessment/Plan: 1) Poorly controlled Type I diabetes with severe gastroparesis-complicated by drug seeking behavior. Continue present care. It may be helpful to see if she can get Domperidone on a compassionate basis. 2) Prolonged QT interval.  3) GERD. 4) HTN. 5) Hypothyroidism. 6) Anxiety disorder.   LOS: 1 day   Juanita Craver 04/19/2022, 7:45 AM

## 2022-04-20 DIAGNOSIS — E1022 Type 1 diabetes mellitus with diabetic chronic kidney disease: Secondary | ICD-10-CM | POA: Diagnosis not present

## 2022-04-20 DIAGNOSIS — K3184 Gastroparesis: Secondary | ICD-10-CM | POA: Diagnosis not present

## 2022-04-20 DIAGNOSIS — R112 Nausea with vomiting, unspecified: Secondary | ICD-10-CM | POA: Diagnosis not present

## 2022-04-20 DIAGNOSIS — N182 Chronic kidney disease, stage 2 (mild): Secondary | ICD-10-CM | POA: Diagnosis not present

## 2022-04-20 LAB — COMPREHENSIVE METABOLIC PANEL
ALT: 19 U/L (ref 0–44)
AST: 25 U/L (ref 15–41)
Albumin: 3 g/dL — ABNORMAL LOW (ref 3.5–5.0)
Alkaline Phosphatase: 70 U/L (ref 38–126)
Anion gap: 8 (ref 5–15)
BUN: 13 mg/dL (ref 6–20)
CO2: 31 mmol/L (ref 22–32)
Calcium: 9.3 mg/dL (ref 8.9–10.3)
Chloride: 98 mmol/L (ref 98–111)
Creatinine, Ser: 1.52 mg/dL — ABNORMAL HIGH (ref 0.44–1.00)
GFR, Estimated: 47 mL/min — ABNORMAL LOW (ref >60)
Glucose, Bld: 137 mg/dL — ABNORMAL HIGH (ref 70–99)
Potassium: 3.9 mmol/L (ref 3.5–5.1)
Sodium: 137 mmol/L (ref 135–145)
Total Bilirubin: 1.1 mg/dL (ref 0.3–1.2)
Total Protein: 6.5 g/dL (ref 6.5–8.1)

## 2022-04-20 LAB — GLUCOSE, CAPILLARY
Glucose-Capillary: 148 mg/dL — ABNORMAL HIGH (ref 70–99)
Glucose-Capillary: 238 mg/dL — ABNORMAL HIGH (ref 70–99)
Glucose-Capillary: 188 mg/dL — ABNORMAL HIGH (ref 70–99)

## 2022-04-20 LAB — MAGNESIUM: Magnesium: 1.8 mg/dL (ref 1.7–2.4)

## 2022-04-20 MED ORDER — KETOROLAC TROMETHAMINE 30 MG/ML IJ SOLN: 30.0000 mg | Freq: Four times a day (QID) | INTRAMUSCULAR | Status: DC | PRN

## 2022-04-20 NOTE — Progress Notes (Signed)
[facility] Subjective: Patient is a 32 year old female with poorly controlled type 1 diabetes and severe gastroparesis complicated by hypertension and drug-seeking behavior requiring multiple hospitalizations and ER visits. Patient seems to be more comfortable today claims she is thrown up once since I last saw her yesterday. Her abdominal pain seems to have improved little bit as well.  She is not very communicative and seems to be sleeping most of the time.  Objective: Vital signs in last 24 hours: Temp:  [98 F (36.7 C)-99.2 F (37.3 C)] 98.1 F (36.7 C) (03/03 0348) Pulse Rate:  [102-113] 102 (03/03 0348) Resp:  [16-20] 16 (03/03 0348) BP: (102-172)/(67-118) 104/67 (03/03 0348) SpO2:  [94 %-100 %] 94 % (03/03 0348) Last BM Date : 04/18/22  Intake/Output from previous day: 03/02 0701 - 03/03 0700 In: 5058.4 [P.O.:240; I.V.:4818.4] Out: 200 [Emesis/NG output:200] Intake/Output this shift: No intake/output data recorded.  General appearance: cooperative, appears stated age, fatigued, and no distress Resp: clear to auscultation bilaterally Cardio: regular rate and rhythm, S1, S2 normal, no murmur, click, rub or gallop GI: soft, non-tender; bowel sounds normal; no masses,  no organomegaly  Lab Results: Recent Labs    04/17/22 1245 04/18/22 0647  WBC 8.3 13.7*  HGB 11.7* 12.4  HCT 34.9* 38.6  PLT 415* 431*   BMET Recent Labs    04/17/22 1245 04/18/22 0647  NA 134* 137  K 3.3* 3.2*  CL 100 99  CO2 22 24  GLUCOSE 257* 230*  BUN 22* 18  CREATININE 1.49* 1.26*  CALCIUM 9.3 9.8   LFT Recent Labs    04/18/22 0647  PROT 8.0  ALBUMIN 3.6  AST 24  ALT 20  ALKPHOS 76  BILITOT 1.1   PT/INR Recent Labs    04/18/22 0647  LABPROT 13.5  INR 1.0    Studies/Results: No results found.  Medications: I have reviewed the patient's current medications. Prior to Admission:  Medications Prior to Admission  Medication Sig Dispense Refill Last Dose    atorvastatin (LIPITOR) 20 MG tablet Take 1 tablet (20 mg total) by mouth daily. 90 tablet 2 04/17/2022   carvedilol (COREG) 6.25 MG tablet Take 6.25 mg by mouth 2 (two) times daily.   04/17/2022 at 0800   gabapentin (NEURONTIN) 300 MG capsule Take 1 capsule (300 mg total) by mouth 3 (three) times daily. 90 capsule 2 04/17/2022   hydrOXYzine (VISTARIL) 25 MG capsule Take 1 capsule (25 mg total) by mouth every 8 (eight) hours as needed. (Patient taking differently: Take 25 mg by mouth every 8 (eight) hours as needed for anxiety.) 30 capsule 0 04/17/2022   insulin detemir (LEVEMIR FLEXTOUCH) 100 UNIT/ML FlexPen Inject 13 Units into the skin 2 (two) times daily. 10 mL 2 04/17/2022   insulin regular (NOVOLIN R) 100 units/mL injection Inject 0-0.15 mLs (0-15 Units total) into the skin See admin instructions. Inject 0-15 units into the skin three times a day with meals, per sliding scale 10 mL 1 04/17/2022   losartan (COZAAR) 25 MG tablet Take 1 tablet (25 mg total) by mouth daily. 30 tablet 0 04/17/2022   ondansetron (ZOFRAN) 4 MG tablet Take 4 mg by mouth every 8 (eight) hours as needed for nausea or vomiting.   04/17/2022   pantoprazole (PROTONIX) 40 MG tablet Take 1 tablet (40 mg total) by mouth 2 (two) times daily before a meal. 60 tablet 5 04/17/2022   promethazine (PHENERGAN) 25 MG suppository Place 1 suppository (25 mg total) rectally every 6 (six)  hours as needed for nausea or vomiting. 12 each 0 Unk   Continuous Blood Gluc Transmit (DEXCOM G6 TRANSMITTER) MISC Change every 90 days 1 each 3    Scheduled:  atorvastatin  20 mg Oral Daily   carvedilol  6.25 mg Oral BID   gabapentin  300 mg Oral TID   insulin aspart  0-15 Units Subcutaneous TID WC   insulin aspart  0-5 Units Subcutaneous QHS   insulin detemir  13 Units Subcutaneous BID   lidocaine  1 patch Transdermal Q24H   losartan  25 mg Oral Daily   multivitamin with minerals  1 tablet Oral Daily   pantoprazole (PROTONIX) IV  40 mg Intravenous Q12H    scopolamine  1 patch Transdermal Q72H   sodium chloride flush  3 mL Intravenous Q12H   Continuous:  lactated ringers 125 mL/hr at 04/20/22 0508   Assessment/Plan: 1) Poorly controlled Type I diabetes with severe gastroparesis-complicated by drug seeking behavior. Continue present care. It may be helpful to see if she can get Domperidone on a compassionate basis. 2) Prolonged QT interval.  3) GERD. 4) HTN. 5) Hypothyroidism. 6) Anxiety disorder.     LOS: 2 days   Juanita Craver 04/20/2022, 8:57 AM

## 2022-04-20 NOTE — Progress Notes (Signed)
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  Unfortunately multiple hospitalizations.  3 ER visits in 2 weeks after discharge from the hospital.  Admitted due to significant findings.   Assessment & Plan:   Diabetic gastroparesis: Some symptom relief today.  She wants to try some full liquid diet.  Will advance. Continue tramadol, will add Toradol for 24 hours. Nausea medications, avoid narcotics especially Dilaudid. GI consulted.  Protonix, Reglan, Phenergan and Zofran.  Diabetes type 1, poorly controlled with hyperglycemia: Not on DKA.  Started on long-acting insulin and sliding scale insulin low doses as she does not have adequate intake.  Essential hypertension blood pressure stable on carvedilol and losartan.  Hyperlipidemia: On atorvastatin.  Hypokalemia: Replaced.  Adequate.   DVT prophylaxis: SCDs Start: 04/17/22 1744   Code Status: Full code Family Communication: None at the bedside Disposition Plan: Status is: Inpatient.  Remains inpatient because of not tolerating diet.   Consultants:  Gastroenterology  Procedures:  None  Antimicrobials:  None   Subjective:  Patient seen and examined.  She tells me that her abdominal pain is slightly better.  Denies any nausea at rest.  She wants to see if he can try some full liquid diet.  She wants to try Toradol if that helps.  With her kidney functions, will only use limited doses.  Using Zofran about 3 times a day.  Objective: Vitals:   04/19/22 2209 04/20/22 0111 04/20/22 0348 04/20/22 1017  BP: 102/67 (!) 146/108 104/67 112/76  Pulse: (!) 107 (!) 113 (!) 102 (!) 101  Resp: '16  16 18   '$ Temp: 98.4 F (36.9 C)  98.1 F (36.7 C) 98.7 F (37.1 C)  TempSrc: Oral  Oral Oral  SpO2: 100%  94% 100%  Weight:        Intake/Output Summary (Last 24 hours) at 04/20/2022 1128 Last data filed at 04/20/2022 0319 Gross per 24 hour  Intake 2621.64 ml  Output 200 ml  Net 2421.64 ml    Filed Weights   04/18/22 0629  Weight: 77.9 kg    Examination:  General: Flat affect.  Does not want to have conversation. Cardiovascular: S1-S2 normal.  Regular rate rhythm. Respiratory: Bilateral clear.  No added sounds. Gastrointestinal: Soft.  There is no rigidity. She has some voluntary guarding only. Ext: No deformities.   Neuro: No focal neurological deficits.    Data Reviewed: I have personally reviewed following labs and imaging studies  CBC: Recent Labs  Lab 04/16/22 1332 04/17/22 1245 04/18/22 0647  WBC 8.3 8.3 13.7*  NEUTROABS  --  5.8  --   HGB 11.4* 11.7* 12.4  HCT 35.2* 34.9* 38.6  MCV 88.9 86.2 90.0  PLT 398 415* 431*    Basic Metabolic Panel: Recent Labs  Lab 04/16/22 1332 04/17/22 1245 04/18/22 0647 04/20/22 0857  NA 134* 134* 137 137  K 4.2 3.3* 3.2* 3.9  CL 99 100 99 98  CO2 '27 22 24 31  '$ GLUCOSE 423* 257* 230* 137*  BUN 20 22* 18 13  CREATININE 1.34* 1.49* 1.26* 1.52*  CALCIUM 9.3 9.3 9.8 9.3  MG  --   --   --  1.8    GFR: Estimated Creatinine Clearance: 59.4 mL/min (A) (by C-G  formula based on SCr of 1.52 mg/dL (H)). Liver Function Tests: Recent Labs  Lab 04/16/22 1332 04/17/22 1245 04/18/22 0647 04/20/22 0857  AST '22 29 24 25  '$ ALT '22 23 20 19  '$ ALKPHOS 92 76 76 70  BILITOT 0.9 1.2 1.1 1.1  PROT 7.3 7.6 8.0 6.5  ALBUMIN 3.6 3.4* 3.6 3.0*    Recent Labs  Lab 04/16/22 1332 04/17/22 1245  LIPASE 42 29    No results for input(s): "AMMONIA" in the last 168 hours. Coagulation Profile: Recent Labs  Lab 04/18/22 0647  INR 1.0    Cardiac Enzymes: No results for input(s): "CKTOTAL", "CKMB", "CKMBINDEX", "TROPONINI" in the last 168  hours. BNP (last 3 results) No results for input(s): "PROBNP" in the last 8760 hours. HbA1C: No results for input(s): "HGBA1C" in the last 72 hours. CBG: Recent Labs  Lab 04/19/22 0741 04/19/22 1151 04/19/22 1613 04/19/22 2205 04/20/22 0711  GLUCAP 178* 113* 153* 160* 148*    Lipid Profile: No results for input(s): "CHOL", "HDL", "LDLCALC", "TRIG", "CHOLHDL", "LDLDIRECT" in the last 72 hours. Thyroid Function Tests: No results for input(s): "TSH", "T4TOTAL", "FREET4", "T3FREE", "THYROIDAB" in the last 72 hours. Anemia Panel: No results for input(s): "VITAMINB12", "FOLATE", "FERRITIN", "TIBC", "IRON", "RETICCTPCT" in the last 72 hours. Sepsis Labs: No results for input(s): "PROCALCITON", "LATICACIDVEN" in the last 168 hours.  Recent Results (from the past 240 hour(s))  Resp panel by RT-PCR (RSV, Flu A&B, Covid) Urine, Clean Catch     Status: None   Collection Time: 04/10/22 10:50 PM   Specimen: Urine, Clean Catch; Nasal Swab  Result Value Ref Range Status   SARS Coronavirus 2 by RT PCR NEGATIVE NEGATIVE Final    Comment: (NOTE) SARS-CoV-2 target nucleic acids are NOT DETECTED.  The SARS-CoV-2 RNA is generally detectable in upper respiratory specimens during the acute phase of infection. The lowest concentration of SARS-CoV-2 viral copies this assay can detect is 138 copies/mL. A negative result does not preclude SARS-Cov-2 infection and should not be used as the sole basis for treatment or other patient management decisions. A negative result may occur with  improper specimen collection/handling, submission of specimen other than nasopharyngeal swab, presence of viral mutation(s) within the areas targeted by this assay, and inadequate number of viral copies(<138 copies/mL). A negative result must be combined with clinical observations, patient history, and epidemiological information. The expected result is Negative.  Fact Sheet for Patients:   EntrepreneurPulse.com.au  Fact Sheet for Healthcare Providers:  IncredibleEmployment.be  This test is no t yet approved or cleared by the [place] FDA and  has been authorized for detection and/or diagnosis of SARS-CoV-2 by FDA under an Emergency Use Authorization (EUA). This EUA will remain  in effect (meaning this test can be used) for the duration of the COVID-19 declaration under Section 564(b)(1) of the Act, 21 U.S.C.section 360bbb-3(b)(1), unless the authorization is terminated  or revoked sooner.       Influenza A by PCR NEGATIVE NEGATIVE Final   Influenza B by PCR NEGATIVE NEGATIVE Final    Comment: (NOTE) The Xpert Xpress SARS-CoV-2/FLU/RSV plus assay is intended as an aid in the diagnosis of influenza from Nasopharyngeal swab specimens and should not be used as a sole basis for treatment. Nasal washings and aspirates are unacceptable for Xpert Xpress SARS-CoV-2/FLU/RSV testing.  Fact Sheet for Patients: EntrepreneurPulse.com.au  Fact Sheet for Healthcare Providers: IncredibleEmployment.be  This test is not yet approved or cleared by the [place] FDA and has been authorized for detection  and/or diagnosis of SARS-CoV-2 by FDA under an Emergency Use Authorization (EUA). This EUA will remain in effect (meaning this test can be used) for the duration of the COVID-19 declaration under Section 564(b)(1) of the Act, 21 U.S.C. section 360bbb-3(b)(1), unless the authorization is terminated or revoked.     Resp Syncytial Virus by PCR NEGATIVE NEGATIVE Final    Comment: (NOTE) Fact Sheet for Patients: EntrepreneurPulse.com.au  Fact Sheet for Healthcare Providers: IncredibleEmployment.be  This test is not yet approved or cleared by the [place] FDA and has been authorized for detection and/or diagnosis of SARS-CoV-2 by FDA under an Emergency Use  Authorization (EUA). This EUA will remain in effect (meaning this test can be used) for the duration of the COVID-19 declaration under Section 564(b)(1) of the Act, 21 U.S.C. section 360bbb-3(b)(1), unless the authorization is terminated or revoked.  Performed at [facility], [place] [address] [place], [place] [address]          Radiology Studies: No results found.      Scheduled Meds:  atorvastatin  20 mg Oral Daily   carvedilol  6.25 mg Oral BID   gabapentin  300 mg Oral TID   insulin aspart  0-15 Units Subcutaneous TID WC   insulin aspart  0-5 Units Subcutaneous QHS   insulin detemir  13 Units Subcutaneous BID   lidocaine  1 patch Transdermal Q24H   losartan  25 mg Oral Daily   multivitamin with minerals  1 tablet Oral Daily   pantoprazole (PROTONIX) IV  40 mg Intravenous Q12H   scopolamine  1 patch Transdermal Q72H   sodium chloride flush  3 mL Intravenous Q12H   Continuous Infusions:  lactated ringers 125 mL/hr at 04/20/22 0508     LOS: 2 days    Time spent: 35 minutes    Barb Merino, MD [facility] Pager [phone]

## 2022-04-21 DIAGNOSIS — R112 Nausea with vomiting, unspecified: Secondary | ICD-10-CM | POA: Diagnosis not present

## 2022-04-21 DIAGNOSIS — K3184 Gastroparesis: Secondary | ICD-10-CM | POA: Diagnosis not present

## 2022-04-21 DIAGNOSIS — K920 Hematemesis: Secondary | ICD-10-CM | POA: Diagnosis not present

## 2022-04-21 LAB — GLUCOSE, CAPILLARY
Glucose-Capillary: 33 mg/dL — CL (ref 70–99)
Glucose-Capillary: 43 mg/dL — CL (ref 70–99)
Glucose-Capillary: 86 mg/dL (ref 70–99)
Glucose-Capillary: 148 mg/dL — ABNORMAL HIGH (ref 70–99)

## 2022-04-21 MED ORDER — ORAL CARE MOUTH RINSE: 15.0000 mL | OROMUCOSAL | Status: DC | PRN

## 2022-04-21 MED ORDER — PROMETHAZINE HCL 25 MG RE SUPP: 25.0000 mg | Freq: Four times a day (QID) | RECTAL | 0 refills | Status: DC | PRN

## 2022-04-21 MED ORDER — ONDANSETRON HCL 4 MG PO TABS: 4.0000 mg | ORAL_TABLET | Freq: Three times a day (TID) | ORAL | 0 refills | Status: DC | PRN

## 2022-04-21 MED ORDER — INSULIN DETEMIR 100 UNIT/ML ~~LOC~~ SOLN
8.0000 [IU] | Freq: Two times a day (BID) | SUBCUTANEOUS | Status: DC
  Filled 2022-04-21: qty 0.08

## 2022-04-21 NOTE — Inpatient Diabetes Management (Signed)
Inpatient Diabetes Program Recommendations  AACE/ADA: New Consensus Statement on Inpatient Glycemic Control (2015)  Target Ranges:  Prepandial:   less than 140 mg/dL      Peak postprandial:   less than 180 mg/dL (1-2 hours)      Critically ill patients:  140 - 180 mg/dL   Lab Results  Component Value Date   GLUCAP 86 04/21/2022   HGBA1C 11.2 (H) 03/23/2022    Review of Glycemic Control  Latest Reference Range & Units 04/20/22 11:45 04/20/22 21:08 04/21/22 06:45 04/21/22 06:47 04/21/22 07:25  Glucose-Capillary 70 - 99 mg/dL 238 (H) 188 (H) 33 (LL) 43 (LL) 86  (LL): Data is critically low (H): Data is abnormally high Diabetes history: Type 1 DM Outpatient Diabetes medications: Levemir 13 units BID, Regular 0-15 units TID Current orders for Inpatient glycemic control: Levemir 13 units BID, Novolog 0-15 units TID & HS  Inpatient Diabetes Program Recommendations:    Noted hypoglycemia this AM of 33 mg/dL.  Consider decreasing Levemir 8 units QHS.  Thanks, Bronson Curb, MSN, RNC-OB Diabetes Coordinator [phone] (8a-5p)

## 2022-04-21 NOTE — Discharge Summary (Signed)
Physician Discharge Summary  Kelli Hudson [id number] DOB: 1990-08-30 DOA: 04/17/2022  PCP: Pcp, No  Admit date: 04/17/2022 Discharge date: 04/21/2022  Admitted From: Home Disposition: Home  Recommendations for Outpatient Follow-up:  Follow up with PCP in 1-2 weeks Follow-up with gastroenterology as scheduled  Discharge Condition: Stable CODE STATUS: Full code Diet recommendation: Low-salt low-carb diet, gastroparesis diet.  Frequently small meals.  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  Unfortunately multiple hospitalizations.  3 ER visits in 2 weeks after discharge from the hospital.  Admitted due to significant symptoms. 3/4, hypoglycemic episode in the morning but improved.  Patient treated with IV fluids, initially n.p.o. then gradually food was introduced.  She was managed with mostly nonnarcotic medications, Tylenol and tramadol.  Injectable Dilaudid was avoided to prevent from worsening gastroparesis. Patient is currently tolerating soft diet, she has not needed any nausea medications for last 24 hours.  She has not needed any pain medications for last 24 hours.  She has adequate bowel function. Patient was also seen and followed by GI. She will go home and continue to take Protonix.  She will be taking Zofran as needed, Phenergan suppository as needed.  She will follow-up with GI clinic.   Diabetes type 1, poorly controlled with hyperglycemia:  Hypoglycemia today that is corrected with eating regular diet and skipping morning dose of medication.  Blood sugars are adequate today. Patient was instructed to take half dose, 8 units of insulin tonight and if normal eating she can go back on her regular doses of 13 units twice daily and carb count with meals.    Essential hypertension blood pressure stable on carvedilol and losartan.   Hyperlipidemia: On atorvastatin.   Hypokalemia: Replaced.  Adequate.  Stable for discharge.   Discharge Diagnoses:  Active Problems:   Hypertension associated with diabetes ([place])   DM type 1 (diabetes mellitus, type 1) (HCC)   Intractable nausea and vomiting   Gastroparesis   HLD (hyperlipidemia)   Intractable vomiting with nausea   Malnutrition of moderate degree    Discharge Instructions  Discharge Instructions     Ambulatory referral to Gastroenterology   Complete by: As directed    What is the reason for referral?: Other   Diet - low sodium heart healthy   Complete by: As directed    Diet Carb Modified   Complete by: As directed    Discharge instructions   Complete by: As directed    Take half dose of insulin tonight . You can start full dose if you are eating meals without any problem tomorrow   Increase activity slowly   Complete by: As directed    No wound care   Complete by: As directed       Allergies as of 04/21/2022       Reactions   Lisinopril Cough   Breakout in rash        Medication List     TAKE these medications    atorvastatin 20 MG tablet Commonly known as: LIPITOR Take 1 tablet (20 mg total) by mouth daily.   carvedilol 6.25 MG tablet Commonly known as: COREG Take 6.25 mg by mouth 2 (two) times daily.   Dexcom G6 Transmitter Misc Change every 90 days   gabapentin 300 MG capsule Commonly known as: NEURONTIN Take 1 capsule (300 mg total) by mouth 3 (  three) times daily.   hydrOXYzine 25 MG capsule Commonly known as: VISTARIL Take 1 capsule (25 mg total) by mouth every 8 (eight) hours as needed. What changed: reasons to take this   insulin regular 100 units/mL injection Commonly known as: NovoLIN R Inject 0-0.15 mLs (0-15 Units total) into the skin See admin instructions. Inject 0-15 units into the skin three times a day with meals, per sliding  scale   Levemir FlexTouch 100 UNIT/ML FlexPen Generic drug: insulin detemir Inject 13 Units into the skin 2 (two) times daily.   losartan 25 MG tablet Commonly known as: COZAAR Take 1 tablet (25 mg total) by mouth daily.   ondansetron 4 MG tablet Commonly known as: ZOFRAN Take 4 mg by mouth every 8 (eight) hours as needed for nausea or vomiting.   pantoprazole 40 MG tablet Commonly known as: PROTONIX Take 1 tablet (40 mg total) by mouth 2 (two) times daily before a meal.   promethazine 25 MG suppository Commonly known as: PHENERGAN Place 1 suppository (25 mg total) rectally every 6 (six) hours as needed for nausea or vomiting.        Follow-up [facility].   Specialty: Gastroenterology Contact information: [place] [phone]        [facility], [place].   Contact information: [address] [place] [address] [phone]                Allergies  Allergen Reactions   Lisinopril Cough    Breakout in rash    Consultations: Gastroenterology   Procedures/Studies: CT Angio Chest PE W and/or Wo Contrast  Result Date: 04/05/2022 CLINICAL DATA:  Positive D-dimer EXAM: CT ANGIOGRAPHY CHEST WITH CONTRAST TECHNIQUE: Multidetector CT imaging of the chest was performed using the standard protocol during bolus administration of intravenous contrast. Multiplanar CT image reconstructions and MIPs were obtained to evaluate the vascular anatomy. RADIATION DOSE REDUCTION: This exam was performed according to the departmental dose-optimization program which includes automated exposure control, adjustment of the mA and/or kV according to patient size and/or use of iterative reconstruction technique. CONTRAST:  28m OMNIPAQUE IOHEXOL 350 MG/ML SOLN COMPARISON:  Chest x-ray from earlier in the same day. FINDINGS: Cardiovascular: Thoracic aorta shows a normal branching  pattern. No aneurysmal dilatation or dissection is seen. No cardiac enlargement is noted. The pulmonary artery shows a normal branching pattern bilaterally. No filling defect to suggest pulmonary embolism is noted. No coronary calcifications are seen. Mediastinum/Nodes: Thoracic inlet is within normal limits. No hilar or mediastinal adenopathy is noted. The esophagus is distended with air which may be related to reflux. Lungs/Pleura: Lungs are well aerated bilaterally. No focal infiltrate or sizable effusion is seen. No parenchymal nodules are seen. No pneumothorax is noted. Upper Abdomen: As described on earlier CT of the abdomen and pelvis. Musculoskeletal: No acute rib abnormality is noted. No compression deformity is seen. Review of the MIP images confirms the above findings. IMPRESSION: No evidence of pulmonary emboli. No acute abnormality noted. Electronically Signed   By: MInez CatalinaM.D.   On: 04/05/2022 17:04   CT ABDOMEN PELVIS W CONTRAST  Result Date: 04/05/2022 CLINICAL DATA:  RLQ abdominal pain EXAM: CT ABDOMEN AND PELVIS WITH CONTRAST TECHNIQUE: Multidetector CT imaging of the abdomen and pelvis was performed using the standard protocol following bolus administration of intravenous contrast. RADIATION DOSE REDUCTION: This exam was performed according to the departmental dose-optimization program which includes  automated exposure control, adjustment of the mA and/or kV according to patient size and/or use of iterative reconstruction technique. CONTRAST:  188m OMNIPAQUE IOHEXOL 300 MG/ML  SOLN COMPARISON:  CT AP, 03/22/2022 and 08/24/2021. FINDINGS: Lower chest: No acute abnormality. Hepatobiliary: No focal liver abnormality is seen. No gallstones, gallbladder wall thickening, or biliary dilatation. Pancreas: No pancreatic ductal dilatation or surrounding inflammatory changes. Spleen: Normal in size without focal abnormality. Adrenals/Urinary Tract: Adrenal glands are unremarkable. Kidneys are  normal, without renal calculi, focal lesion, or hydronephrosis. Small volume of dependent gas within the bladder, correlate for recent catheterization. Additional non dependent gas within the urinary bladder, with additional trace focus of gas within the Retzius space. Stomach/Bowel: Stomach is within normal limits. Appendix appears normal. No evidence of bowel wall thickening, distention, or inflammatory changes. Vascular/Lymphatic: No significant vascular findings are present. No enlarged abdominal or pelvic lymph nodes. Reproductive: Uterus and adnexa are unremarkable. Other: No abdominal wall hernia or abnormality. No abdominopelvic ascites. No intraperitoneal free air. Musculoskeletal: No acute or significant osseous findings. IMPRESSION: 1. Emphysematous cystitis. 2. Small volume dependent gas within the urinary bladder. Correlate for catheterization Electronically Signed   By: JMichaelle BirksM.D.   On: 04/05/2022 15:04   DG Chest Portable 1 View  Result Date: 04/05/2022 CLINICAL DATA:  chest pain EXAM: PORTABLE CHEST - 1 VIEW COMPARISON:  01/03/2022 FINDINGS: Cardiac silhouette is unremarkable. No pneumothorax or pleural effusion. The lungs are clear. The visualized skeletal structures are unremarkable. IMPRESSION: No acute cardiopulmonary process. Electronically Signed   By: JSammie BenchM.D.   On: 04/05/2022 14:07   (Echo, Carotid, EGD, Colonoscopy, ERCP)    Subjective: Patient was seen and examined in the morning rounds.  Denies any complaints.  She had low blood sugars but did not feel any problems.  In the morning rounds, she ate regular diet.  Denies any pain or nausea.  Wants to go home today.   Discharge Exam: Vitals:   04/21/22 0519 04/21/22 1350  BP: 111/73 113/74  Pulse: 84 (!) 103  Resp: 14 16  Temp: 97.6 F (36.4 C) 98.2 F (36.8 C)  SpO2: 99% 100%   Vitals:   04/20/22 1328 04/20/22 2105 04/21/22 0519 04/21/22 1350  BP: 117/85 (!) 139/95 111/73 113/74  Pulse: 94 100 84  (!) 103  Resp:  '16 14 16  '$ Temp: 98.5 F (36.9 C) 98.1 F (36.7 C) 97.6 F (36.4 C) 98.2 F (36.8 C)  TempSrc: Oral Oral Oral   SpO2: 99% 100% 99% 100%  Weight:        General: Pt is alert, awake, not in acute distress Walking in the hallway.  Flat affect.  Not very interactive but looks very comfortable. Cardiovascular: RRR, S1/S2 +, no rubs, no gallops Respiratory: CTA bilaterally, no wheezing, no rhonchi Abdominal: Soft, NT, ND, bowel sounds + Extremities: no edema, no cyanosis    The results of significant diagnostics from this hospitalization (including imaging, microbiology, ancillary and laboratory) are listed below for reference.     Microbiology: No results found for this or any previous visit (from the past 240 hour(s)).   Labs: BNP (last 3 results) No results for input(s): "BNP" in the last 8760 hours. Basic Metabolic Panel: Recent Labs  Lab 04/16/22 1332 04/17/22 1245 04/18/22 0647 04/20/22 0857  NA 134* 134* 137 137  K 4.2 3.3* 3.2* 3.9  CL 99 100 99 98  CO2 '27 22 24 31  '$ GLUCOSE 423* 257* 230* 137*  BUN 20 22* 18  13  CREATININE 1.34* 1.49* 1.26* 1.52*  CALCIUM 9.3 9.3 9.8 9.3  MG  --   --   --  1.8   Liver Function Tests: Recent Labs  Lab 04/16/22 1332 04/17/22 1245 04/18/22 0647 04/20/22 0857  AST '22 29 24 25  '$ ALT '22 23 20 19  '$ ALKPHOS 92 76 76 70  BILITOT 0.9 1.2 1.1 1.1  PROT 7.3 7.6 8.0 6.5  ALBUMIN 3.6 3.4* 3.6 3.0*   Recent Labs  Lab 04/16/22 1332 04/17/22 1245  LIPASE 42 29   No results for input(s): "AMMONIA" in the last 168 hours. CBC: Recent Labs  Lab 04/16/22 1332 04/17/22 1245 04/18/22 0647  WBC 8.3 8.3 13.7*  NEUTROABS  --  5.8  --   HGB 11.4* 11.7* 12.4  HCT 35.2* 34.9* 38.6  MCV 88.9 86.2 90.0  PLT 398 415* 431*   Cardiac Enzymes: No results for input(s): "CKTOTAL", "CKMB", "CKMBINDEX", "TROPONINI" in the last 168 hours. BNP: Invalid input(s): "POCBNP" CBG: Recent Labs  Lab 04/20/22 2108 04/21/22 0645  04/21/22 0647 04/21/22 0725 04/21/22 1133  GLUCAP 188* 33* 43* 86 148*   D-Dimer No results for input(s): "DDIMER" in the last 72 hours. Hgb A1c No results for input(s): "HGBA1C" in the last 72 hours. Lipid Profile No results for input(s): "CHOL", "HDL", "LDLCALC", "TRIG", "CHOLHDL", "LDLDIRECT" in the last 72 hours. Thyroid function studies No results for input(s): "TSH", "T4TOTAL", "T3FREE", "THYROIDAB" in the last 72 hours.  Invalid input(s): "FREET3" Anemia work up No results for input(s): "VITAMINB12", "FOLATE", "FERRITIN", "TIBC", "IRON", "RETICCTPCT" in the last 72 hours. Urinalysis    Component Value Date/Time   COLORURINE STRAW (A) 04/10/2022 2232   APPEARANCEUR CLEAR 04/10/2022 2232   LABSPEC 1.016 04/10/2022 2232   PHURINE 7.0 04/10/2022 2232   GLUCOSEU >=500 (A) 04/10/2022 2232   GLUCOSEU NEGATIVE 05/22/2020 1417   HGBUR NEGATIVE 04/10/2022 2232   BILIRUBINUR NEGATIVE 04/10/2022 2232   KETONESUR NEGATIVE 04/10/2022 2232   PROTEINUR >=300 (A) 04/10/2022 2232   UROBILINOGEN 0.2 05/22/2020 1417   NITRITE NEGATIVE 04/10/2022 2232   LEUKOCYTESUR NEGATIVE 04/10/2022 2232   Sepsis Labs Recent Labs  Lab 04/16/22 1332 04/17/22 1245 04/18/22 0647  WBC 8.3 8.3 13.7*   Microbiology No results found for this or any previous visit (from the past 240 hour(s)).   Time coordinating discharge:  35 minutes  SIGNED:   Barb Merino, MD  [facility] 04/21/2022, 2:05 PM

## 2022-04-21 NOTE — Progress Notes (Addendum)
    Progress Note   Subjective  Chief Complaint: Severe gastroparesis  This morning patient found lying in the bed with a blanket over her head.  She barely answers my questions.  Tells me she was able to tolerate grits this morning and is on a soft diet.  No further nausea or vomiting.  No abdominal pain.  In general she is doing much better.   Objective   Vital signs in last 24 hours: Temp:  [97.6 F (36.4 C)-98.7 F (37.1 C)] 97.6 F (36.4 C) (03/04 0519) Pulse Rate:  [84-101] 84 (03/04 0519) Resp:  [14-18] 14 (03/04 0519) BP: (111-139)/(73-95) 111/73 (03/04 0519) SpO2:  [99 %-100 %] 99 % (03/04 0519) Last BM Date : 04/18/22 General:   AA female in NAD Heart:  Regular rate and rhythm; no murmurs Lungs: Respirations even and unlabored, lungs CTA bilaterally Abdomen:  Soft, mild epigastric TTP and nondistended. Normal bowel sounds. Psych: Moderately cooperative  Intake/Output from previous day: 03/03 0701 - 03/04 0700 In: 3579.7 [P.O.:240; I.V.:3339.7] Out: -   BMET Recent Labs    04/20/22 0857  NA 137  K 3.9  CL 98  CO2 31  GLUCOSE 137*  BUN 13  CREATININE 1.52*  CALCIUM 9.3   LFT Recent Labs    04/20/22 0857  PROT 6.5  ALBUMIN 3.0*  AST 25  ALT 19  ALKPHOS 70  BILITOT 1.1    Assessment / Plan:   Assessment: 1.  Severe gastroparesis: Due to poorly controlled type 1 diabetes complicated by drug-seeking behavior, some better overnight tolerating a soft diet this morning 2.  Prolonged QT 3.  GERD 4.  Hypertension 5.  Hypothyroidism 6.  Anxiety disorder  Plan: 1.  Per Dr. Collene Mares over the weekend may be helpful to see if she can get Domperidone on a compassionate basis 2.  For now continue current diet and advance as tolerated 3.  Continue supportive measures  We will sign off, please call us back if you have any further questions for [place].   LOS: 3 days   Levin Erp  04/21/2022, 9:12 AM  GI ATTENDING  Agree with interval progress note as  outlined.  Patient has been discharged.  Docia Chuck. Geri Seminole., M.D. [facility] Division of Gastroenterology

## 2022-04-21 NOTE — Progress Notes (Signed)
Patient stated that she felt as though her BG was low. Performed blood glucose stick on patient, first result of 33, second 43. Followed hypoglycemia protocol, patient able to drink juice. Informed NP of blood glucose level.

## 2022-04-21 NOTE — Progress Notes (Signed)
PROGRESS NOTE    Kelli Hudson  [id number] DOB: [birth year] DOA: 04/17/2022 PCP: Pcp, No    Brief Narrative:  32 year old with history of poorly controlled type 1 diabetes with gastroparesis, hypertension, drug-seeking behavior and multiple hospitalizations presents to the emergency room with another episode of persistent epigastric pain, intolerance to diet.  Unfortunately multiple hospitalizations.  3 ER visits in 2 weeks after discharge from the hospital.  Admitted due to significant findings. 3/4, hypoglycemic episode in the morning without much symptoms.   Assessment & Plan:   Diabetic gastroparesis: Some clinical improvement today.  She wants to try soft diet.  Will advance to soft diet. Pain is controlled.  Nausea is controlled. GI consulted.  Protonix, Reglan, Phenergan and Zofran.  Diabetes type 1, poorly controlled with hyperglycemia:  Hypoglycemia today.   Not on DKA.  on long-acting insulin and sliding scale insulin low doses as she does not have adequate intake.  Hold morning insulin doses until she has eaten adequately.  Essential hypertension blood pressure stable on carvedilol and losartan.  Hyperlipidemia: On atorvastatin.  Hypokalemia: Replaced.  Adequate.   DVT prophylaxis: SCDs Start: 04/17/22 1744   Code Status: Full code Family Communication: None at the bedside Disposition Plan: Status is: Inpatient.  Remains inpatient because of not tolerating diet.   Consultants:  Gastroenterology  Procedures:  None  Antimicrobials:  None   Subjective:  Patient seen and examined.  She tells me she is fine.  Very flat affect and difficult to have conversation.  She tells me pain is controlled and she wants to try some real food.  Objective: Vitals:   04/20/22 1017 04/20/22 1328 04/20/22 2105 04/21/22 0519  BP: 112/76 117/85 (!) 139/95 111/73  Pulse: (!) 101 94 100 84  Resp: '18  16 14  '$ Temp: 98.7 F (37.1 C) 98.5 F (36.9 C) 98.1 F (36.7  C) 97.6 F (36.4 C)  TempSrc: Oral Oral Oral Oral  SpO2: 100% 99% 100% 99%  Weight:        Intake/Output Summary (Last 24 hours) at 04/21/2022 1043 Last data filed at 04/21/2022 1000 Gross per 24 hour  Intake 3948.27 ml  Output --  Net 3948.27 ml   Filed Weights   04/18/22 0629  Weight: 77.9 kg    Examination:  General: Flat affect.  Difficult to keep up conversation.  Looks comfortable. Cardiovascular: S1-S2 normal.  Regular rate rhythm. Respiratory: Bilateral clear.  No added sounds. Gastrointestinal: Soft.  There is no rigidity.  Nontender.   Ext: No deformities.   Neuro: No focal neurological deficits.    Data Reviewed: I have personally reviewed following labs and imaging studies  CBC: Recent Labs  Lab 04/16/22 1332 04/17/22 1245 04/18/22 0647  WBC 8.3 8.3 13.7*  NEUTROABS  --  5.8  --   HGB 11.4* 11.7* 12.4  HCT 35.2* 34.9* 38.6  MCV 88.9 86.2 90.0  PLT 398 415* [id number]*   Basic Metabolic Panel: Recent Labs  Lab 04/16/22 1332 04/17/22 1245 04/18/22 0647 04/20/22 0857  NA 134* 134* 137 137  K 4.2 3.3* 3.2* 3.9  CL 99 100 99 98  CO2 '27 22 24 31  '$ GLUCOSE 423* 257* 230* 137*  BUN 20 22* 18 13  CREATININE 1.34* 1.49* 1.26* 1.52*  CALCIUM 9.3 9.3 9.8 9.3  MG  --   --   --  1.8   GFR: Estimated Creatinine Clearance: 59.4 mL/min (A) (by C-G formula based on SCr of 1.52 mg/dL (H)). Liver Function Tests: Recent Labs  Lab 04/16/22 1332 04/17/22 1245 04/18/22 0647 04/20/22 0857  AST '22 29 24 25  '$ ALT '22 23 20 19  '$ ALKPHOS 92 76 76 70  BILITOT 0.9 1.2 1.1 1.1  PROT 7.3 7.6 8.0 6.5  ALBUMIN 3.6 3.4* 3.6 3.0*   Recent Labs  Lab 04/16/22 1332 04/17/22 1245  LIPASE 42 29   No results for input(s): "AMMONIA" in the last 168 hours. Coagulation Profile: Recent Labs  Lab 04/18/22 0647  INR 1.0   Cardiac Enzymes: No results for input(s): "CKTOTAL", "CKMB", "CKMBINDEX", "TROPONINI" in the last 168 hours. BNP (last 3 results) No results for input(s):  "PROBNP" in the last 8760 hours. HbA1C: No results for input(s): "HGBA1C" in the last 72 hours. CBG: Recent Labs  Lab 04/20/22 1145 04/20/22 2108 04/21/22 0645 04/21/22 0647 04/21/22 0725  GLUCAP 238* 188* 33* 43* 86   Lipid Profile: No results for input(s): "CHOL", "HDL", "LDLCALC", "TRIG", "CHOLHDL", "LDLDIRECT" in the last 72 hours. Thyroid Function Tests: No results for input(s): "TSH", "T4TOTAL", "FREET4", "T3FREE", "THYROIDAB" in the last 72 hours. Anemia Panel: No results for input(s): "VITAMINB12", "FOLATE", "FERRITIN", "TIBC", "IRON", "RETICCTPCT" in the last 72 hours. Sepsis Labs: No results for input(s): "PROCALCITON", "LATICACIDVEN" in the last 168 hours.  No results found for this or any previous visit (from the past 240 hour(s)).        Radiology Studies: No results found.      Scheduled Meds:  atorvastatin  20 mg Oral Daily   carvedilol  6.25 mg Oral BID   gabapentin  300 mg Oral TID   insulin aspart  0-15 Units Subcutaneous TID WC   insulin aspart  0-5 Units Subcutaneous QHS   insulin detemir  13 Units Subcutaneous BID   lidocaine  1 patch Transdermal Q24H   losartan  25 mg Oral Daily   multivitamin with minerals  1 tablet Oral Daily   pantoprazole (PROTONIX) IV  40 mg Intravenous Q12H   scopolamine  1 patch Transdermal Q72H   sodium chloride flush  3 mL Intravenous Q12H   Continuous Infusions:  lactated ringers 125 mL/hr at 04/21/22 1000     LOS: 3 days    Time spent: 35 minutes    Barb Merino, MD [facility] Pager [phone]

## 2022-04-21 NOTE — Progress Notes (Signed)
AVS papers given, Education provided. Per MD to take only 8 units at night today, as stated on AVS. All questions are answered. IV removed and intact. Belongings with the patient. Waiting on a ride

## 2022-04-22 ENCOUNTER — Telehealth: Payer: Self-pay

## 2022-04-22 ENCOUNTER — Other Ambulatory Visit (HOSPITAL_COMMUNITY): Payer: Self-pay

## 2022-04-22 NOTE — Telephone Encounter (Signed)
PA request received via CMM for Dexcom G6 Sensor  PA has been submitted to [place] [employer] and is pending determination.  Patient has not had appt for more than 3 months which may contribute to denial .  Key: [id number]

## 2022-04-23 NOTE — Telephone Encounter (Signed)
Can we contact patient for appointment. Needs to have a face to face for insurance to cover Dexcom supplies.

## 2022-04-25 NOTE — Telephone Encounter (Signed)
Message sent vis MyChart and left on cell phone voice to call office to schedule appointment.

## 2022-05-01 NOTE — Telephone Encounter (Signed)
Message left on cell phone voice mail to call office to schedule appointment.

## 2022-05-05 ENCOUNTER — Inpatient Hospital Stay (HOSPITAL_COMMUNITY)
Admission: EM | Admit: 2022-05-05 | Discharge: 2022-05-09 | DRG: 074 | Disposition: A | Discharge status: Home / Self Care | Payer: Medicaid Other | Attending: Internal Medicine | Admitting: Internal Medicine

## 2022-05-05 ENCOUNTER — Other Ambulatory Visit: Payer: Self-pay

## 2022-05-05 DIAGNOSIS — K3184 Gastroparesis: Secondary | ICD-10-CM | POA: Diagnosis not present

## 2022-05-05 DIAGNOSIS — Z794 Long term (current) use of insulin: Secondary | ICD-10-CM

## 2022-05-05 DIAGNOSIS — E1042 Type 1 diabetes mellitus with diabetic polyneuropathy: Secondary | ICD-10-CM | POA: Diagnosis present

## 2022-05-05 DIAGNOSIS — Z833 Family history of diabetes mellitus: Secondary | ICD-10-CM

## 2022-05-05 DIAGNOSIS — G8929 Other chronic pain: Secondary | ICD-10-CM | POA: Diagnosis present

## 2022-05-05 DIAGNOSIS — E1159 Type 2 diabetes mellitus with other circulatory complications: Secondary | ICD-10-CM | POA: Diagnosis present

## 2022-05-05 DIAGNOSIS — E1065 Type 1 diabetes mellitus with hyperglycemia: Secondary | ICD-10-CM | POA: Diagnosis present

## 2022-05-05 DIAGNOSIS — E785 Hyperlipidemia, unspecified: Secondary | ICD-10-CM | POA: Diagnosis present

## 2022-05-05 DIAGNOSIS — E0590 Thyrotoxicosis, unspecified without thyrotoxic crisis or storm: Secondary | ICD-10-CM | POA: Diagnosis present

## 2022-05-05 DIAGNOSIS — K219 Gastro-esophageal reflux disease without esophagitis: Secondary | ICD-10-CM | POA: Diagnosis present

## 2022-05-05 DIAGNOSIS — E0865 Diabetes mellitus due to underlying condition with hyperglycemia: Secondary | ICD-10-CM

## 2022-05-05 DIAGNOSIS — E1043 Type 1 diabetes mellitus with diabetic autonomic (poly)neuropathy: Principal | ICD-10-CM | POA: Diagnosis present

## 2022-05-05 DIAGNOSIS — Z765 Malingerer [conscious simulation]: Secondary | ICD-10-CM

## 2022-05-05 DIAGNOSIS — R9431 Abnormal electrocardiogram [ECG] [EKG]: Secondary | ICD-10-CM | POA: Diagnosis present

## 2022-05-05 DIAGNOSIS — Z89422 Acquired absence of other left toe(s): Secondary | ICD-10-CM

## 2022-05-05 DIAGNOSIS — N179 Acute kidney failure, unspecified: Secondary | ICD-10-CM | POA: Diagnosis not present

## 2022-05-05 DIAGNOSIS — R112 Nausea with vomiting, unspecified: Secondary | ICD-10-CM | POA: Diagnosis present

## 2022-05-05 DIAGNOSIS — Z888 Allergy status to other drugs, medicaments and biological substances status: Secondary | ICD-10-CM

## 2022-05-05 DIAGNOSIS — E860 Dehydration: Secondary | ICD-10-CM | POA: Diagnosis present

## 2022-05-05 DIAGNOSIS — E1143 Type 2 diabetes mellitus with diabetic autonomic (poly)neuropathy: Secondary | ICD-10-CM | POA: Diagnosis present

## 2022-05-05 DIAGNOSIS — I152 Hypertension secondary to endocrine disorders: Secondary | ICD-10-CM | POA: Diagnosis present

## 2022-05-05 DIAGNOSIS — E109 Type 1 diabetes mellitus without complications: Secondary | ICD-10-CM | POA: Diagnosis present

## 2022-05-05 DIAGNOSIS — Z79899 Other long term (current) drug therapy: Secondary | ICD-10-CM

## 2022-05-05 LAB — URINALYSIS, ROUTINE W REFLEX MICROSCOPIC
Bilirubin Urine: NEGATIVE
Glucose, UA: >=500 mg/dL — AB
Ketones, ur: NEGATIVE mg/dL
Leukocytes,Ua: NEGATIVE
Nitrite: NEGATIVE
Protein, ur: >=300 mg/dL — AB
Specific Gravity, Urine: 1.016 (ref 1.005–1.030)
pH: 6 (ref 5.0–8.0)

## 2022-05-05 LAB — RAPID URINE DRUG SCREEN, HOSP PERFORMED
Amphetamines: NOT DETECTED
Barbiturates: NOT DETECTED
Benzodiazepines: NOT DETECTED
Cocaine: NOT DETECTED
Opiates: NOT DETECTED
Tetrahydrocannabinol: NOT DETECTED

## 2022-05-05 LAB — CBC WITH DIFFERENTIAL/PLATELET
Abs Immature Granulocytes: 0.04 10*3/uL (ref 0.00–0.07)
Basophils Absolute: 0.1 10*3/uL (ref 0.0–0.1)
Basophils Relative: 0 %
Eosinophils Absolute: 0 10*3/uL (ref 0.0–0.5)
Eosinophils Relative: 0 %
HCT: 36.8 % (ref 36.0–46.0)
Hemoglobin: 12.3 g/dL (ref 12.0–15.0)
Immature Granulocytes: 0 %
Lymphocytes Relative: 14 %
Lymphs Abs: 1.7 10*3/uL (ref 0.7–4.0)
MCH: 29.2 pg (ref 26.0–34.0)
MCHC: 33.4 g/dL (ref 30.0–36.0)
MCV: 87.4 fL (ref 80.0–100.0)
Monocytes Absolute: 0.4 10*3/uL (ref 0.1–1.0)
Monocytes Relative: 3 %
Neutro Abs: 10.1 10*3/uL — ABNORMAL HIGH (ref 1.7–7.7)
Neutrophils Relative %: 83 %
Platelets: 412 10*3/uL — ABNORMAL HIGH (ref 150–400)
RBC: 4.21 MIL/uL (ref 3.87–5.11)
RDW: 13.4 % (ref 11.5–15.5)
WBC: 12.4 10*3/uL — ABNORMAL HIGH (ref 4.0–10.5)
nRBC: 0 % (ref 0.0–0.2)

## 2022-05-05 LAB — COMPREHENSIVE METABOLIC PANEL
ALT: 29 U/L (ref 0–44)
AST: 33 U/L (ref 15–41)
Albumin: 3.8 g/dL (ref 3.5–5.0)
Alkaline Phosphatase: 81 U/L (ref 38–126)
Anion gap: 9 (ref 5–15)
BUN: 23 mg/dL — ABNORMAL HIGH (ref 6–20)
CO2: 25 mmol/L (ref 22–32)
Calcium: 9.6 mg/dL (ref 8.9–10.3)
Chloride: 103 mmol/L (ref 98–111)
Creatinine, Ser: 1.42 mg/dL — ABNORMAL HIGH (ref 0.44–1.00)
GFR, Estimated: 51 mL/min — ABNORMAL LOW (ref >60)
Glucose, Bld: 277 mg/dL — ABNORMAL HIGH (ref 70–99)
Potassium: 5 mmol/L (ref 3.5–5.1)
Sodium: 137 mmol/L (ref 135–145)
Total Bilirubin: 1.3 mg/dL — ABNORMAL HIGH (ref 0.3–1.2)
Total Protein: 8 g/dL (ref 6.5–8.1)

## 2022-05-05 LAB — I-STAT BETA HCG BLOOD, ED (MC, WL, AP ONLY)
I-stat hCG, quantitative: <5 m[IU]/mL (ref <5)
I-stat hCG, quantitative: <5 m[IU]/mL (ref <5)

## 2022-05-05 LAB — MRSA NEXT GEN BY PCR, NASAL: MRSA by PCR Next Gen: NOT DETECTED

## 2022-05-05 LAB — GLUCOSE, CAPILLARY: Glucose-Capillary: 183 mg/dL — ABNORMAL HIGH (ref 70–99)

## 2022-05-05 LAB — LIPASE, BLOOD: Lipase: 30 U/L (ref 11–51)

## 2022-05-05 MED ORDER — LACTATED RINGERS IV BOLUS
2000.0000 mL | Freq: Once | INTRAVENOUS | Status: AC
  Administered 2022-05-05: 2000 mL via INTRAVENOUS

## 2022-05-05 MED ORDER — ENOXAPARIN SODIUM 40 MG/0.4ML IJ SOSY
40.0000 mg | PREFILLED_SYRINGE | INTRAMUSCULAR | Status: DC
  Administered 2022-05-05 – 2022-05-08 (×4): 40 mg via SUBCUTANEOUS
  Filled 2022-05-05 (×4): qty 0.4

## 2022-05-05 MED ORDER — PANTOPRAZOLE SODIUM 40 MG IV SOLR
40.0000 mg | Freq: Once | INTRAVENOUS | Status: AC
  Administered 2022-05-05: 40 mg via INTRAVENOUS
  Filled 2022-05-05: qty 10

## 2022-05-05 MED ORDER — INSULIN ASPART 100 UNIT/ML IJ SOLN
0.0000 [IU] | Freq: Every day | INTRAMUSCULAR | Status: DC
  Administered 2022-05-08: 2 [IU] via SUBCUTANEOUS
  Filled 2022-05-05: qty 0.05

## 2022-05-05 MED ORDER — SCOPOLAMINE 1 MG/3DAYS TD PT72
1.0000 | MEDICATED_PATCH | TRANSDERMAL | Status: DC
  Administered 2022-05-05 – 2022-05-08 (×2): 1.5 mg via TRANSDERMAL
  Filled 2022-05-05 (×2): qty 1

## 2022-05-05 MED ORDER — SODIUM CHLORIDE 0.9 % IV SOLN: INTRAVENOUS | Status: DC

## 2022-05-05 MED ORDER — MELATONIN 5 MG PO TABS
5.0000 mg | ORAL_TABLET | Freq: Every evening | ORAL | Status: DC | PRN
  Administered 2022-05-05: 5 mg via ORAL
  Filled 2022-05-05 (×2): qty 1

## 2022-05-05 MED ORDER — TRAMADOL HCL 50 MG PO TABS
50.0000 mg | ORAL_TABLET | Freq: Three times a day (TID) | ORAL | Status: DC | PRN
  Administered 2022-05-05 – 2022-05-06 (×2): 50 mg via ORAL
  Filled 2022-05-05 (×2): qty 1

## 2022-05-05 MED ORDER — KETOROLAC TROMETHAMINE 15 MG/ML IJ SOLN
15.0000 mg | Freq: Four times a day (QID) | INTRAMUSCULAR | Status: DC | PRN
  Administered 2022-05-05 – 2022-05-06 (×3): 15 mg via INTRAVENOUS
  Filled 2022-05-05 (×3): qty 1

## 2022-05-05 MED ORDER — TRIMETHOBENZAMIDE HCL 100 MG/ML IM SOLN: 200.0000 mg | Freq: Once | INTRAMUSCULAR | Status: DC

## 2022-05-05 MED ORDER — INSULIN DETEMIR 100 UNIT/ML ~~LOC~~ SOLN
5.0000 [IU] | Freq: Two times a day (BID) | SUBCUTANEOUS | Status: DC
  Administered 2022-05-05 – 2022-05-09 (×8): 5 [IU] via SUBCUTANEOUS
  Filled 2022-05-05 (×10): qty 0.05

## 2022-05-05 MED ORDER — ORAL CARE MOUTH RINSE: 15.0000 mL | OROMUCOSAL | Status: DC | PRN

## 2022-05-05 MED ORDER — METOCLOPRAMIDE HCL 5 MG/ML IJ SOLN
10.0000 mg | Freq: Once | INTRAMUSCULAR | Status: AC
  Administered 2022-05-05: 10 mg via INTRAMUSCULAR
  Filled 2022-05-05: qty 2

## 2022-05-05 MED ORDER — PANTOPRAZOLE SODIUM 40 MG IV SOLR
40.0000 mg | Freq: Two times a day (BID) | INTRAVENOUS | Status: DC
  Administered 2022-05-05 – 2022-05-08 (×8): 40 mg via INTRAVENOUS
  Filled 2022-05-05 (×8): qty 10

## 2022-05-05 MED ORDER — DROPERIDOL 2.5 MG/ML IJ SOLN
1.2500 mg | Freq: Once | INTRAMUSCULAR | Status: DC
  Filled 2022-05-05: qty 2

## 2022-05-05 MED ORDER — PROMETHAZINE HCL 25 MG RE SUPP
25.0000 mg | Freq: Four times a day (QID) | RECTAL | Status: DC | PRN
  Administered 2022-05-05 – 2022-05-07 (×3): 25 mg via RECTAL
  Filled 2022-05-05 (×5): qty 1

## 2022-05-05 MED ORDER — DICYCLOMINE HCL 10 MG PO CAPS
10.0000 mg | ORAL_CAPSULE | Freq: Once | ORAL | Status: AC
  Administered 2022-05-05: 10 mg via ORAL
  Filled 2022-05-05: qty 1

## 2022-05-05 MED ORDER — METOCLOPRAMIDE HCL 5 MG/ML IJ SOLN
10.0000 mg | Freq: Once | INTRAMUSCULAR | Status: DC
  Filled 2022-05-05: qty 2

## 2022-05-05 MED ORDER — ONDANSETRON HCL 4 MG/2ML IJ SOLN
4.0000 mg | Freq: Four times a day (QID) | INTRAMUSCULAR | Status: DC | PRN
  Administered 2022-05-05 (×2): 4 mg via INTRAVENOUS
  Filled 2022-05-05 (×2): qty 2

## 2022-05-05 MED ORDER — ONDANSETRON 8 MG PO TBDP
8.0000 mg | ORAL_TABLET | Freq: Once | ORAL | Status: AC
  Administered 2022-05-05: 8 mg via ORAL
  Filled 2022-05-05: qty 1

## 2022-05-05 MED ORDER — MELATONIN 5 MG PO TABS: 5.0000 mg | ORAL_TABLET | Freq: Once | ORAL | Status: DC

## 2022-05-05 MED ORDER — ONDANSETRON HCL 4 MG/2ML IJ SOLN
4.0000 mg | Freq: Once | INTRAMUSCULAR | Status: DC
  Filled 2022-05-05: qty 2

## 2022-05-05 MED ORDER — CHLORHEXIDINE GLUCONATE CLOTH 2 % EX PADS
6.0000 | MEDICATED_PAD | Freq: Every day | CUTANEOUS | Status: DC
  Administered 2022-05-05 – 2022-05-07 (×3): 6 via TOPICAL

## 2022-05-05 MED ORDER — PROCHLORPERAZINE EDISYLATE 10 MG/2ML IJ SOLN
10.0000 mg | Freq: Once | INTRAMUSCULAR | Status: AC
  Administered 2022-05-05: 10 mg via INTRAVENOUS
  Filled 2022-05-05: qty 2

## 2022-05-05 MED ORDER — METOPROLOL TARTRATE 5 MG/5ML IV SOLN
5.0000 mg | INTRAVENOUS | Status: DC | PRN
  Administered 2022-05-05 (×3): 5 mg via INTRAVENOUS
  Filled 2022-05-05 (×3): qty 5

## 2022-05-05 MED ORDER — INSULIN ASPART 100 UNIT/ML IJ SOLN
0.0000 [IU] | Freq: Three times a day (TID) | INTRAMUSCULAR | Status: DC
  Administered 2022-05-05: 5 [IU] via SUBCUTANEOUS
  Administered 2022-05-06 (×2): 3 [IU] via SUBCUTANEOUS
  Administered 2022-05-06: 8 [IU] via SUBCUTANEOUS
  Administered 2022-05-07 (×2): 3 [IU] via SUBCUTANEOUS
  Administered 2022-05-08: 11 [IU] via SUBCUTANEOUS
  Administered 2022-05-08: 3 [IU] via SUBCUTANEOUS
  Administered 2022-05-09: 5 [IU] via SUBCUTANEOUS
  Filled 2022-05-05: qty 0.15

## 2022-05-05 NOTE — H&P (Signed)
History and Physical  Kelli Hudson DOB: 1990-07-29 DOA: 05/05/2022  PCP: Pcp, No   Chief Complaint: Vomiting   HPI: Kelli Hudson is a 32 y.o. female with medical history significant for poorly controlled DM1, drug seeking behavior and multiple hospital admission for intractable nausea and vomiting from her gastroparesis, being admitted for the same.  Patient was discharged on disability last on March 4, previous to that she had 5 ER visits in 10 days.  During her hospital admissions, efforts are made to avoid narcotic, as she is severely exhibits drug-seeking behavior.  I was with my nursing staff that during previous admission, she actually wandered into the drug room, looking for narcotics, and in another instance tried to grab a vial of pain medication from the nurse in order to self administer it.  In any case, the patient is not very conversant with me, answers mostly yes or no questions.  States that she is actually feeling fine since her last discharge from the hospital.  But today she had sudden onset of abdominal pain which is epigastric and does not radiate, and has been vomiting.  Says her last bowel movement was 2 days ago, and was normal.  Denies any blood in her stool or emesis.  Denies fevers or chills.  Agrees that her abdominal pain is similar to her usual gastroparesis flares.  Again according to nursing staff, she has been walking around the halls, constantly demanding.  Narcotic pain medication, around so much that her 1 L fluid bolus that was ordered 3 hours ago still has not been able to complete.  In any case, she is not tolerating p.o., she continues to vomit and so hospitalist was contacted for admission.  Review of Systems: Please see HPI for pertinent positives and negatives. A complete 10 system review of systems are otherwise negative.  Past Medical History:  Diagnosis Date   Acute H. pylori gastric ulcer    Coffee ground emesis     Diabetes mellitus ([place])    Diabetic gastroparesis ([place])    DKA (diabetic ketoacidosis) ([place]) 02/24/2021   Gastroparesis    GERD (gastroesophageal reflux disease)    Hypertension    Hyperthyroidism    Intractable nausea and vomiting 04/20/2021   Normocytic anemia 04/20/2020   Prolonged Q-T interval on ECG    Past Surgical History:  Procedure Laterality Date   AMPUTATION TOE Left 03/10/2018   Procedure: AMPUTATION FIFTH TOE;  Surgeon: Trula Slade, DPM;  Location: [facility];  Service: Podiatry;  Laterality: Left;   BIOPSY  01/28/2020   Procedure: BIOPSY;  Surgeon: Otis Brace, MD;  Location: [facility];  Service: Gastroenterology;;   BOTOX INJECTION  08/26/2021   Procedure: BOTOX INJECTION;  Surgeon: Doran Stabler, MD;  Location: [facility];  Service: Gastroenterology;;   ESOPHAGOGASTRODUODENOSCOPY N/A 01/28/2020   Procedure: ESOPHAGOGASTRODUODENOSCOPY (EGD);  Surgeon: Otis Brace, MD;  Location: [facility];  Service: Gastroenterology;  Laterality: N/A;   ESOPHAGOGASTRODUODENOSCOPY (EGD) WITH PROPOFOL Left 09/08/2015   Procedure: ESOPHAGOGASTRODUODENOSCOPY (EGD) WITH PROPOFOL;  Surgeon: Arta Silence, MD;  Location: [facility]COPY;  Service: Endoscopy;  Laterality: Left;   ESOPHAGOGASTRODUODENOSCOPY (EGD) WITH PROPOFOL N/A 08/26/2021   Procedure: ESOPHAGOGASTRODUODENOSCOPY (EGD) WITH PROPOFOL;  Surgeon: Doran Stabler, MD;  Location: [facility];  Service: Gastroenterology;  Laterality: N/A;   WISDOM TOOTH EXTRACTION      Social History:  reports that she has never smoked. She has never used smokeless tobacco. She reports current alcohol use of about  1.0 standard drink of alcohol per week. She reports that she does not use drugs.   Allergies  Allergen Reactions   Lisinopril Cough    Breakout in rash    Family History  Problem Relation Age of Onset   Lung cancer Mother    Diabetes Mother    Bipolar disorder Father    Liver cancer  Maternal Grandfather    Diabetes Maternal Aunt        x 2   Pancreatic cancer Maternal Uncle    Prostate cancer Maternal Uncle    Breast cancer Other        maternal great aunt   Heart disease Other    Ovarian cancer Other        maternal great aunt   Kidney disease Other        maternal great aunt   Colon cancer Neg Hx    Stomach cancer Neg Hx      Prior to Admission medications   Medication Sig Start Date End Date Taking? Authorizing Provider  atorvastatin (LIPITOR) 20 MG tablet Take 1 tablet (20 mg total) by mouth daily. 10/29/21   [name], MD  carvedilol (COREG) 6.25 MG tablet Take 6.25 mg by mouth 2 (two) times daily. 03/22/22   [provider]  Continuous Blood Gluc Transmit (DEXCOM G6 TRANSMITTER) MISC Change every 90 days 11/29/21   Shamleffer, Melanie Crazier, MD  gabapentin (NEURONTIN) 300 MG capsule Take 1 capsule (300 mg total) by mouth 3 (three) times daily. 12/30/21 04/17/22  Barb Merino, MD  hydrOXYzine (VISTARIL) 25 MG capsule Take 1 capsule (25 mg total) by mouth every 8 (eight) hours as needed. Patient taking differently: Take 25 mg by mouth every 8 (eight) hours as needed for anxiety. 07/31/21   Georgette Shell, MD  insulin detemir (LEVEMIR FLEXTOUCH) 100 UNIT/ML FlexPen Inject 13 Units into the skin 2 (two) times daily. 03/25/22   Mercy Riding, MD  insulin regular (NOVOLIN R) 100 units/mL injection Inject 0-0.15 mLs (0-15 Units total) into the skin See admin instructions. Inject 0-15 units into the skin three times a day with meals, per sliding scale 03/06/22   Shelly Coss, MD  losartan (COZAAR) 25 MG tablet Take 1 tablet (25 mg total) by mouth daily. 03/06/22   Shelly Coss, MD  ondansetron (ZOFRAN) 4 MG tablet Take 1 tablet (4 mg total) by mouth every 8 (eight) hours as needed for nausea or vomiting. 04/21/22   Barb Merino, MD  pantoprazole (PROTONIX) 40 MG tablet Take 1 tablet (40 mg total) by mouth 2 (two) times daily before a meal. 11/08/21    Hosie Poisson, MD  promethazine (PHENERGAN) 25 MG suppository Place 1 suppository (25 mg total) rectally every 6 (six) hours as needed for nausea or vomiting. 04/21/22   Barb Merino, MD  insulin aspart (NOVOLOG) 100 UNIT/ML FlexPen Inject 5 Units into the skin 3 (three) times daily with meals. 02/23/18 07/05/19  Donne Hazel, MD    Physical Exam: BP (!) 218/178   Pulse (!) 128   Temp 98.7 F (37.1 C) (Oral)   Resp (!) 23   SpO2 98%   General:  Alert, oriented, calm, in no acute distress  Eyes: EOMI, clear conjuctivae, white sclerea Neck: supple, no masses, trachea mildline  Cardiovascular: RRR, no murmurs or rubs, no peripheral edema  Respiratory: clear to auscultation bilaterally, no wheezes, no crackles  Abdomen: soft, nontender, nondistended, normal bowel tones heard  Skin: dry, no rashes  Musculoskeletal: no  joint effusions, normal range of motion  Psychiatric: appropriate affect, normal speech  Neurologic: extraocular muscles intact, clear speech, moving all extremities with intact sensorium          Labs on Admission:  Basic Metabolic Panel: Recent Labs  Lab 05/05/22 1305  NA 137  K 5.0  CL 103  CO2 25  GLUCOSE 277*  BUN 23*  CREATININE 1.42*  CALCIUM 9.6   Liver Function Tests: Recent Labs  Lab 05/05/22 1305  AST 33  ALT 29  ALKPHOS 81  BILITOT 1.3*  PROT 8.0  ALBUMIN 3.8   Recent Labs  Lab 05/05/22 1305  LIPASE 30   No results for input(s): "AMMONIA" in the last 168 hours. CBC: Recent Labs  Lab 05/05/22 1305  WBC 12.4*  NEUTROABS 10.1*  HGB 12.3  HCT 36.8  MCV 87.4  PLT 412*   Cardiac Enzymes: No results for input(s): "CKTOTAL", "CKMB", "CKMBINDEX", "TROPONINI" in the last 168 hours.  BNP (last 3 results) No results for input(s): "BNP" in the last 8760 hours.  ProBNP (last 3 results) No results for input(s): "PROBNP" in the last 8760 hours.  CBG: No results for input(s): "GLUCAP" in the last 168 hours.  Radiological Exams on  Admission: No results found.  Assessment/Plan Principal Problem:   Diabetic gastroparesis associated with type 1 diabetes mellitus ([place]) complicated by Drug-seeking behavior -Per previous gastroenterology consultations during prior hospital stays, we will place her on IV fluids, place scopolamine patch, as needed Zofran will be given.  I have ordered Toradol in case the patient has truly severe pain and needs that treated.  Otherwise we will continue supportive care, will not order her home p.o. medications for now, since she cannot tolerate.  Transition her p.o. PPI to IV PPI twice daily.   Hypertension associated with diabetes ([place])   DM type 1 (diabetes mellitus, type 1) ([place]) -will continue her basal insulin at reduced dose, and add sliding scale   GERD without esophagitis   Diabetic polyneuropathy associated with type 1 diabetes mellitus ([facility])   Prolonged Q-T interval on ECG   Refractory nausea and vomiting  DVT prophylaxis: Lovenox   Code Status:  Full Code  Consults called: None  Admission status: Observation   Time spent: 48 minutes  Adine Heimann Neva Seat MD [facility] Pager [phone]  If 7PM-7AM, please contact night-coverage [email or web address] Password [facility]  05/05/2022, 3:54 PM

## 2022-05-05 NOTE — ED Provider Notes (Signed)
[facility] Provider Note   CSN: [id number] Arrival date & time: 05/05/22  1049     History  Chief Complaint  Patient presents with   Abdominal Pain    Kelli Hudson is a 32 y.o. female.  32 y.o. female presenting for acute on chronic abdominal pain.  History of type 1 diabetes, gastroparesis.  Extensive history seen here many times for similar.  Discharged from hospital for abdominal pain 3/4.  She states that she has had recurrence of her nausea and vomiting.  She states she has been compliant with her medications but as of today she was unable to keep down her antiemetics.  She is without fever, chills, dyspnea, chest pain.   The history is provided by the patient and medical records. No language interpreter was used.       Home Medications Prior to Admission medications   Medication Sig Start Date End Date Taking? Authorizing Provider  atorvastatin (LIPITOR) 20 MG tablet Take 1 tablet (20 mg total) by mouth daily. 10/29/21   [name], MD  carvedilol (COREG) 6.25 MG tablet Take 6.25 mg by mouth 2 (two) times daily. 03/22/22   [provider]  Continuous Blood Gluc Transmit (DEXCOM G6 TRANSMITTER) MISC Change every 90 days 11/29/21   Shamleffer, Melanie Crazier, MD  gabapentin (NEURONTIN) 300 MG capsule Take 1 capsule (300 mg total) by mouth 3 (three) times daily. 12/30/21 04/17/22  Barb Merino, MD  hydrOXYzine (VISTARIL) 25 MG capsule Take 1 capsule (25 mg total) by mouth every 8 (eight) hours as needed. Patient taking differently: Take 25 mg by mouth every 8 (eight) hours as needed for anxiety. 07/31/21   Georgette Shell, MD  insulin detemir (LEVEMIR FLEXTOUCH) 100 UNIT/ML FlexPen Inject 13 Units into the skin 2 (two) times daily. 03/25/22   Mercy Riding, MD  insulin regular (NOVOLIN R) 100 units/mL injection Inject 0-0.15 mLs (0-15 Units total) into the skin See admin instructions. Inject 0-15 units into  the skin three times a day with meals, per sliding scale 03/06/22   Shelly Coss, MD  losartan (COZAAR) 25 MG tablet Take 1 tablet (25 mg total) by mouth daily. 03/06/22   Shelly Coss, MD  ondansetron (ZOFRAN) 4 MG tablet Take 1 tablet (4 mg total) by mouth every 8 (eight) hours as needed for nausea or vomiting. 04/21/22   Barb Merino, MD  pantoprazole (PROTONIX) 40 MG tablet Take 1 tablet (40 mg total) by mouth 2 (two) times daily before a meal. 11/08/21   Hosie Poisson, MD  promethazine (PHENERGAN) 25 MG suppository Place 1 suppository (25 mg total) rectally every 6 (six) hours as needed for nausea or vomiting. 04/21/22   Barb Merino, MD  insulin aspart (NOVOLOG) 100 UNIT/ML FlexPen Inject 5 Units into the skin 3 (three) times daily with meals. 02/23/18 07/05/19  Donne Hazel, MD      Allergies    Lisinopril    Review of Systems   Review of Systems  Constitutional:  Negative for chills and fever.  Respiratory:  Negative for shortness of breath.   Cardiovascular:  Negative for chest pain.  Gastrointestinal:  Positive for abdominal pain, nausea and vomiting.  Genitourinary:  Negative for dysuria and flank pain.  Musculoskeletal:  Negative for back pain.  Neurological:  Negative for speech difficulty and light-headedness.  All other systems reviewed and are negative.   Physical Exam Updated Vital Signs BP (!) 185/129   Pulse (!) 130  Temp 99.5 F (37.5 C)   Resp 14   SpO2 99%  Physical Exam Vitals and nursing note reviewed.  Constitutional:      General: She is not in acute distress.    Appearance: Normal appearance. She is not ill-appearing.  HENT:     Head: Normocephalic and atraumatic.     Nose: Nose normal.  Eyes:     General: No scleral icterus.    Extraocular Movements: Extraocular movements intact.     Conjunctiva/sclera: Conjunctivae normal.  Cardiovascular:     Rate and Rhythm: Normal rate and regular rhythm.     Pulses: Normal pulses.  Pulmonary:      Effort: Pulmonary effort is normal. No respiratory distress.     Breath sounds: Normal breath sounds. No wheezing or rales.  Abdominal:     General: There is no distension.     Palpations: Abdomen is soft.     Tenderness: There is abdominal tenderness (generalized). There is no guarding or rebound.  Musculoskeletal:        General: Normal range of motion.     Cervical back: Normal range of motion.  Skin:    General: Skin is warm and dry.  Neurological:     General: No focal deficit present.     Mental Status: She is alert. Mental status is at baseline.     ED Results / Procedures / Treatments   Labs (all labs ordered are listed, but only abnormal results are displayed) Labs Reviewed  CBC WITH DIFFERENTIAL/PLATELET - Abnormal; Notable for the following components:      Result Value   WBC 12.4 (*)    Platelets 412 (*)    Neutro Abs 10.1 (*)    All other components within normal limits  COMPREHENSIVE METABOLIC PANEL - Abnormal; Notable for the following components:   Glucose, Bld 277 (*)    BUN 23 (*)    Creatinine, Ser 1.42 (*)    Total Bilirubin 1.3 (*)    GFR, Estimated 51 (*)    All other components within normal limits  LIPASE, BLOOD  RAPID URINE DRUG SCREEN, HOSP PERFORMED  URINALYSIS, ROUTINE W REFLEX MICROSCOPIC  I-STAT BETA HCG BLOOD, ED ([facility], [facility], [facility] ONLY)    EKG None  Radiology No results found.  Procedures Procedures    Medications Ordered in ED Medications  droperidol (INAPSINE) 2.5 MG/ML injection 1.25 mg (has no administration in time range)  lactated ringers bolus 2,000 mL (2,000 mLs Intravenous New Bag/Given 05/05/22 1327)  dicyclomine (BENTYL) capsule 10 mg (10 mg Oral Given by Other 05/05/22 1246)  pantoprazole (PROTONIX) injection 40 mg (40 mg Intravenous Given 05/05/22 1336)  metoCLOPramide (REGLAN) injection 10 mg (10 mg Intramuscular Given 05/05/22 1232)  ondansetron (ZOFRAN-ODT) disintegrating tablet 8 mg (8 mg Oral Given 05/05/22 1234)    ED  Course/ Medical Decision Making/ A&P Clinical Course as of 05/05/22 1419  Mon May 05, 2022  1417 EKG calculated QTC has been greater than 600.  Heart rate is about 130.  When I manually calculate this using Bazett formula I calculated QTc being 355.  Will proceed with giving dose of droperidol. [AA]    Clinical Course User Index [AA] Evlyn Courier, PA-C                             Medical Decision Making Amount and/or Complexity of Data Reviewed Labs: ordered.  Risk Prescription drug management.   Medical Decision Making /  ED Course   This patient presents to the ED for concern of abdominal pain, nausea, vomiting, this involves an extensive number of treatment options, and is a complaint that carries with it a high risk of complications and morbidity.  The differential diagnosis includes gastroparesis, gastroenteritis, pancreatitis, appendicitis, cholecystitis  MDM: 32 year old female presents with acute on chronic abdominal pain.  History as noted above.  Does appear somewhat uncomfortable initially.  Will give medications and obtain labs.  Recently had CT scan which did not show any concerning findings.  Feels similar to her prior gastroparesis flareups.  She is requesting hydromorphone minding.  We discussed how narcotic medications are not indicated in gastroparesis and will make her symptoms worse.  She states that in her case this helps.  We had an extensive discussion about avoiding narcotics.  CBC shows mild leukocytosis but no significant left shift.  CMP with creatinine of 1.42 which is around her baseline, glucose 277 otherwise without acute findings.  Lipase within normal limits.  UDS unremarkable.  hCG negative.  EKG shows prolonged QTc however when manually measured using the bazett formula on MD Calc but QTc is realistically about 350.  However ultimately we deferred droperidol due to this discrepancy and gave Compazine instead.  Patient remains without improvement in symptoms.   Will discuss with hospitalist for potential admission.  At the end of my shift patient is awaiting callback from hospitalist.  Patient signed out to oncoming provider.   Lab Tests: -I ordered, reviewed, and interpreted labs.   The pertinent results include:   Labs Reviewed  CBC WITH DIFFERENTIAL/PLATELET - Abnormal; Notable for the following components:      Result Value   WBC 12.4 (*)    Platelets 412 (*)    Neutro Abs 10.1 (*)    All other components within normal limits  COMPREHENSIVE METABOLIC PANEL - Abnormal; Notable for the following components:   Glucose, Bld 277 (*)    BUN 23 (*)    Creatinine, Ser 1.42 (*)    Total Bilirubin 1.3 (*)    GFR, Estimated 51 (*)    All other components within normal limits  LIPASE, BLOOD  RAPID URINE DRUG SCREEN, HOSP PERFORMED  URINALYSIS, ROUTINE W REFLEX MICROSCOPIC  I-STAT BETA HCG BLOOD, ED ([facility], [facility], [facility] ONLY)      EKG  EKG Interpretation  Date/Time:    Ventricular Rate:    PR Interval:    QRS Duration:   QT Interval:    QTC Calculation:   R Axis:     Text Interpretation:          Medicines ordered and prescription drug management: Meds ordered this encounter  Medications   lactated ringers bolus 2,000 mL   DISCONTD: ondansetron (ZOFRAN) injection 4 mg   dicyclomine (BENTYL) capsule 10 mg   DISCONTD: metoCLOPramide (REGLAN) injection 10 mg   pantoprazole (PROTONIX) injection 40 mg   metoCLOPramide (REGLAN) injection 10 mg   ondansetron (ZOFRAN-ODT) disintegrating tablet 8 mg   droperidol (INAPSINE) 2.5 MG/ML injection 1.25 mg    -I have reviewed the patients home medicines and have made adjustments as needed  Critical interventions Fluids, symptom management with multiple different medications   Cardiac Monitoring: The patient was maintained on a cardiac monitor.  I personally viewed and interpreted the cardiac monitored which showed an underlying rhythm of: Sinus  Reevaluation: After the interventions noted  above, I reevaluated the patient and found that they have :stayed the same  Co morbidities that complicate  the patient evaluation  Past Medical History:  Diagnosis Date   Acute H. pylori gastric ulcer    Coffee ground emesis    Diabetes mellitus ([place])    Diabetic gastroparesis ([place])    DKA (diabetic ketoacidosis) ([place]) 02/24/2021   Gastroparesis    GERD (gastroesophageal reflux disease)    Hypertension    Hyperthyroidism    Intractable nausea and vomiting 04/20/2021   Normocytic anemia 04/20/2020   Prolonged Q-T interval on ECG       Dispostion: Patient in my shift signout to oncoming provider to discuss with hospitalist for potential admission for intractable symptoms.   Final Clinical Impression(s) / ED Diagnoses Final diagnoses:  Gastroparesis    Rx / DC Orders ED Discharge Orders     None         Evlyn Courier, PA-C 05/05/22 1519    Sherwood Gambler, MD 05/09/22 [phone]

## 2022-05-05 NOTE — ED Notes (Addendum)
Pt keeps pulling off all leads and BP cuff. She has been asked multiple times to keep it on and to stay in the bed.

## 2022-05-05 NOTE — ED Provider Notes (Signed)
Assumed care from [place], PA-C at shift change pending admission for gastroparesis.  See his note for full HPI.  In short, patient is a 32 year old female who presents to the ED due to nausea and vomiting for the past few days.  History of same.  Recent admission and discharged on 3/4.  Patient unable to tolerate p.o. after numerous medications.  Will discuss with hospitalist for admission. UDS negative.  3:34 PM Discussed with [facility] who agrees to admit patient.    Suzy Bouchard, PA-C 05/05/22 [place], DO 05/06/22 [phone]

## 2022-05-05 NOTE — ED Triage Notes (Signed)
C/o generalized abd pain with vomiting that started this am that woke her from sleep.  Patient d/c on 3/4 for same Hx DM w/ gastroparesis.

## 2022-05-05 NOTE — ED Notes (Signed)
IV team is working on IV. Pt keeps getting up and walking around the hallway. She has been told several times that she needs to stay in her room. Pt is irritable and demanding pain medication.

## 2022-05-05 NOTE — ED Notes (Signed)
ED TO INPATIENT HANDOFF REPORT  Name/Age/Gender Kelli Hudson 32 y.o. female  Code Status    Code Status Orders  (From admission, onward)           Start     Ordered   05/05/22 1545  Full code  Continuous       Question:  By:  Answer:  Consent: discussion documented in EHR   05/05/22 1547           Code Status History     Date Active Date Inactive Code Status Order ID Comments User Context   04/17/2022 1745 04/21/2022 2003 Full Code [id number]  Karmen Bongo, MD ED   03/21/2022 2049 03/25/2022 1354 Full Code [id number]  Lenore Cordia, MD Inpatient   03/04/2022 1357 03/06/2022 1816 Full Code [id number]  Shelly Coss, MD ED   02/16/2022 1336 02/17/2022 2145 Full Code [id number]  Reubin Milan, MD ED   01/04/2022 0212 01/05/2022 2001 Full Code [id number]  Howerter, Ethelda Chick, DO ED   12/25/2021 2035 12/30/2021 1511 Full Code [id number]  Clance Boll, MD ED   12/07/2021 0838 12/12/2021 1837 Full Code [id number]  Reubin Milan, MD ED   12/02/2021 1311 12/05/2021 1717 Full Code [id number]  Jonnie Finner, DO Inpatient   11/04/2021 2257 11/08/2021 1822 Full Code [id number]  Etta Quill, DO ED   10/09/2021 2219 10/14/2021 1914 Full Code [id number]  Marcelyn Bruins, MD ED   10/04/2021 2223 10/07/2021 2243 Full Code [id number]  Jonnie Finner, DO Inpatient   09/21/2021 0452 09/25/2021 1726 Full Code [id number]  Kristopher Oppenheim, DO Inpatient   09/21/2021 0334 09/21/2021 0452 Full Code [id number]  Kristopher Oppenheim, DO ED   08/24/2021 2352 08/27/2021 2103 Full Code [id number]  Mansy, Arvella Merles, MD ED   08/16/2021 2208 08/19/2021 2046 Full Code [id number]  Kayleen Memos, DO ED   07/29/2021 0909 07/31/2021 1701 Full Code [id number]  Jonnie Finner, DO Inpatient   07/20/2021 2238 07/23/2021 1543 Full Code [id number]  Domenic Polite, MD ED   06/28/2021 1535 07/01/2021 2031 Full Code [id number]  Jonnie Finner, DO ED   06/10/2021 2104 06/14/2021 1623 Full Code [id number]  Orene Desanctis, DO Inpatient   06/04/2021 1715  06/07/2021 1605 Full Code [id number]  Debbe Odea, MD ED   05/28/2021 1111 05/30/2021 1549 Full Code [id number]  Cherylann Ratel A, DO ED   05/28/2021 0341 05/28/2021 1111 Full Code [id number]  Howerter, Justin B, DO ED   04/20/2021 2151 04/24/2021 2104 Full Code [id number]  Lenore Cordia, MD ED   02/24/2021 2320 02/28/2021 2215 Full Code [id number]  Jonnie Finner, DO Inpatient   02/24/2021 1737 02/24/2021 2320 Full Code [id number]  Jonnie Finner, DO ED   11/16/2020 0424 11/17/2020 1428 Full Code [id number]  Etta Quill, DO ED   09/08/2020 2328 09/11/2020 1951 Full Code [id number]  Etta Quill, DO ED   08/12/2020 2304 08/15/2020 1517 Full Code [id number]  Etta Quill, DO ED   07/25/2020 1810 07/28/2020 1545 Full Code [id number]  Cherylann Ratel A, DO Inpatient   07/23/2020 2038 07/24/2020 1519 Full Code [id number]  Ileene Musa T, DO ED   06/05/2020 2349 06/09/2020 2026 Full Code [id number]  Mendenhall, Grafton, DO ED   05/27/2020 1913 05/28/2020 2248 Full Code [id number]  Raiford Noble McLeod, DO Inpatient   05/09/2020 2149 05/14/2020 2059 Full Code [id number]  Chotiner, Yevonne Aline, MD ED   04/04/2020 1214 04/06/2020 1934 Full Code [id number]  Cherylann Ratel  A, DO ED   02/24/2020 1003 02/27/2020 1622 Full Code [id number]  Jonnie Finner, DO ED   01/25/2020 0113 01/30/2020 1637 Full Code [id number]  Vianne Bulls, MD ED   02/21/2018 1904 02/23/2018 2052 Full Code [id number]  Colbert Ewing, MD ED   02/21/2018 1833 02/21/2018 1904 Full Code [id number]  Colbert Ewing, MD ED   01/02/2016 0842 01/05/2016 1438 Full Code [id number]  Milagros Loll, MD Inpatient   09/02/2015 1729 09/09/2015 2148 Full Code [id number]  Willia Craze, NP ED       Home/SNF/Other Home  Chief Complaint Gastroparesis [K31.84]  Level of Care/Admitting Diagnosis ED Disposition     ED Disposition  Admit   Condition  --   [facility]: [place] [[id number]]  Level of Care: Stepdown [14]  Admit to SDU based on following criteria:  Hemodynamic compromise or significant risk of instability:  Patient requiring short term acute titration and management of vasoactive drips, and invasive monitoring (i.e., CVP and Arterial line).  May place patient in observation at [facility] or [facility] if equivalent level of care is available:: Yes  Covid Evaluation: Asymptomatic - no recent exposure (last 10 days) testing not required  Diagnosis: Gastroparesis [536.3.ICD-9-CM]  Admitting Physician: Lucillie Garfinkel [id number]  Attending Physician: Hollice Gong, MIR Kari.[place] [id number]          Medical History Past Medical History:  Diagnosis Date   Acute H. pylori gastric ulcer    Coffee ground emesis    Diabetes mellitus ([place])    Diabetic gastroparesis ([place])    DKA (diabetic ketoacidosis) ([place]) 02/24/2021   Gastroparesis    GERD (gastroesophageal reflux disease)    Hypertension    Hyperthyroidism    Intractable nausea and vomiting 04/20/2021   Normocytic anemia 04/20/2020   Prolonged Q-T interval on ECG     Allergies Allergies  Allergen Reactions   Lisinopril Cough    Breakout in rash    IV Location/Drains/Wounds Patient Lines/Drains/Airways Status     Active Line/Drains/Airways     Name Placement date Placement time Site Days   Peripheral IV 05/05/22 20 G 1" Left Antecubital 05/05/22  1307  Antecubital  less than 1   Wound / Incision (Open or Dehisced) 03/21/22 Diabetic ulcer Ankle Anterior;Right 03/21/22  2200  Ankle  45   Wound / Incision (Open or Dehisced) 03/21/22 Diabetic ulcer Pretibial Right 03/21/22  2200  Pretibial  45   Wound / Incision (Open or Dehisced) 03/21/22 Diabetic ulcer Ankle Anterior;Left 03/21/22  2200  Ankle  45            Labs/Imaging Results for orders placed or performed during the hospital encounter of 05/05/22 (from the past 48 hour(s))  CBC with Differential/Platelet     Status: Abnormal   Collection Time: 05/05/22  1:05 PM  Result Value Ref Range   WBC 12.4 (H) 4.0 - 10.5 K/uL    RBC 4.21 3.87 - 5.11 MIL/uL   Hemoglobin 12.3 12.0 - 15.0 g/dL   HCT 36.8 36.0 - 46.0 %   MCV 87.4 80.0 - 100.0 fL   MCH 29.2 26.0 - 34.0 pg   MCHC 33.4 30.0 - 36.0 g/dL   RDW 13.4 11.5 - 15.5 %   Platelets 412 (H) 150 - 400 K/uL   nRBC 0.0 0.0 - 0.2 %   Neutrophils Relative % 83 %   Neutro Abs 10.1 (H) 1.7 - 7.7 K/uL   Lymphocytes Relative 14 %  Lymphs Abs 1.7 0.7 - 4.0 K/uL   Monocytes Relative 3 %   Monocytes Absolute 0.4 0.1 - 1.0 K/uL   Eosinophils Relative 0 %   Eosinophils Absolute 0.0 0.0 - 0.5 K/uL   Basophils Relative 0 %   Basophils Absolute 0.1 0.0 - 0.1 K/uL   Immature Granulocytes 0 %   Abs Immature Granulocytes 0.04 0.00 - 0.07 K/uL    Comment: Performed at [facility], [place] [address], [place], [place] [address]  Comprehensive metabolic panel     Status: Abnormal   Collection Time: 05/05/22  1:05 PM  Result Value Ref Range   Sodium 137 135 - 145 mmol/L   Potassium 5.0 3.5 - 5.1 mmol/L    Comment: HEMOLYSIS AT THIS LEVEL MAY AFFECT RESULT   Chloride 103 98 - 111 mmol/L   CO2 25 22 - 32 mmol/L   Glucose, Bld 277 (H) 70 - 99 mg/dL    Comment: Glucose reference range applies only to samples taken after fasting for at least 8 hours.   BUN 23 (H) 6 - 20 mg/dL   Creatinine, Ser 1.42 (H) 0.44 - 1.00 mg/dL   Calcium 9.6 8.9 - 10.3 mg/dL   Total Protein 8.0 6.5 - 8.1 g/dL   Albumin 3.8 3.5 - 5.0 g/dL   AST 33 15 - 41 U/L    Comment: HEMOLYSIS AT THIS LEVEL MAY AFFECT RESULT   ALT 29 0 - 44 U/L    Comment: HEMOLYSIS AT THIS LEVEL MAY AFFECT RESULT   Alkaline Phosphatase 81 38 - 126 U/L   Total Bilirubin 1.3 (H) 0.3 - 1.2 mg/dL    Comment: HEMOLYSIS AT THIS LEVEL MAY AFFECT RESULT   GFR, Estimated 51 (L) >60 mL/min    Comment: (NOTE) Calculated using the CKD-EPI Creatinine Equation (2021)    Anion gap 9 5 - 15    Comment: Performed at [facility], [place] [address], [place], [place] [address]  Lipase, blood     Status: None    Collection Time: 05/05/22  1:05 PM  Result Value Ref Range   Lipase 30 11 - 51 U/L    Comment: Performed at [facility] [place] [address], [place], [place] [address]  I-Stat beta hCG blood, ED ([facility], [facility], [facility] only)     Status: None   Collection Time: 05/05/22  2:02 PM  Result Value Ref Range   I-stat hCG, quantitative <5.0 <5 mIU/mL   Comment 3            Comment:   GEST. AGE      CONC.  (mIU/mL)   <=1 WEEK        5 - 50     2 WEEKS       50 - 500     3 WEEKS       100 - 10,000     4 WEEKS     1,000 - 30,000        FEMALE AND NON-PREGNANT FEMALE:     LESS THAN 5 mIU/mL   Rapid urine drug screen (hospital performed)     Status: None   Collection Time: 05/05/22  2:16 PM  Result Value Ref Range   Opiates NONE DETECTED NONE DETECTED   Cocaine NONE DETECTED NONE DETECTED   Benzodiazepines NONE DETECTED NONE DETECTED   Amphetamines NONE DETECTED NONE DETECTED   Tetrahydrocannabinol NONE DETECTED NONE DETECTED   Barbiturates NONE DETECTED NONE DETECTED    Comment: (  NOTE) DRUG SCREEN FOR MEDICAL PURPOSES ONLY.  IF CONFIRMATION IS NEEDED FOR ANY PURPOSE, NOTIFY LAB WITHIN 5 DAYS.  LOWEST DETECTABLE LIMITS FOR URINE DRUG SCREEN Drug Class                     Cutoff (ng/mL) Amphetamine and metabolites    1000 Barbiturate and metabolites    200 Benzodiazepine                 200 Opiates and metabolites        300 Cocaine and metabolites        300 THC                            50 Performed at [facility], [place] [address] [place], [place] [address]   I-Stat beta hCG blood, ED ([facility], [facility], [facility] only)     Status: None   Collection Time: 05/05/22  2:32 PM  Result Value Ref Range   I-stat hCG, quantitative <5.0 <5 mIU/mL   Comment 3            Comment:   GEST. AGE      CONC.  (mIU/mL)   <=1 WEEK        5 - 50     2 WEEKS       50 - 500     3 WEEKS       100 - 10,000     4 WEEKS     1,000 - 30,000        FEMALE AND NON-PREGNANT FEMALE:     LESS THAN 5  mIU/mL    No results found.  Pending Labs Unresulted Labs (From admission, onward)     Start     Ordered   05/12/22 0500  Creatinine, serum  (enoxaparin (LOVENOX)    CrCl >/= 30 ml/min)  Weekly,   R     Comments: while on enoxaparin therapy   Question:  Specimen collection method  Answer:  IV Team=IV Team collect   05/05/22 1547   05/06/22 [id number]  Basic metabolic panel  Tomorrow morning,   R       Question:  Specimen collection method  Answer:  IV Team=IV Team collect   05/05/22 1547   05/06/22 0500  CBC  Tomorrow morning,   R       Question:  Specimen collection method  Answer:  IV Team=IV Team collect   05/05/22 1547   05/05/22 1544  CBC  (enoxaparin (LOVENOX)    CrCl >/= 30 ml/min)  Once,   R       Comments: Baseline for enoxaparin therapy IF NOT ALREADY DRAWN.  Notify MD if PLT < 100 K.   Question:  Specimen collection method  Answer:  IV Team=IV Team collect   05/05/22 1547   05/05/22 1544  Creatinine, serum  (enoxaparin (LOVENOX)    CrCl >/= 30 ml/min)  Once,   R       Comments: Baseline for enoxaparin therapy IF NOT ALREADY DRAWN.   Question:  Specimen collection method  Answer:  IV Team=IV Team collect   05/05/22 1547   05/05/22 1134  Urinalysis, Routine w reflex microscopic -Urine, Clean Catch  Once,   URGENT       Question:  Specimen Source  Answer:  Urine, Clean Catch   05/05/22 1135  Vitals/Pain Today's Vitals   05/05/22 1330 05/05/22 1345 05/05/22 1400 05/05/22 1538  BP: (!) 181/122 (!) 185/129 (!) 218/178   Pulse:    (!) 128  Resp:  14 (!) 23   Temp:    98.7 F (37.1 C)  TempSrc:    Oral  SpO2:    98%  PainSc:        Isolation Precautions No active isolations  Medications Medications  insulin detemir (LEVEMIR) injection 5 Units (has no administration in time range)  pantoprazole (PROTONIX) injection 40 mg (has no administration in time range)  0.9 %  sodium chloride infusion (has no administration in time range)  ondansetron (ZOFRAN)  injection 4 mg (has no administration in time range)  promethazine (PHENERGAN) suppository 25 mg (has no administration in time range)  scopolamine (TRANSDERM-SCOP) 1 MG/3DAYS 1.5 mg (has no administration in time range)  insulin aspart (novoLOG) injection 0-15 Units (has no administration in time range)  insulin aspart (novoLOG) injection 0-5 Units (has no administration in time range)  enoxaparin (LOVENOX) injection 40 mg (has no administration in time range)  traMADol (ULTRAM) tablet 50 mg (has no administration in time range)  metoprolol tartrate (LOPRESSOR) injection 5 mg (has no administration in time range)  lactated ringers bolus 2,000 mL (2,000 mLs Intravenous New Bag/Given 05/05/22 1327)  dicyclomine (BENTYL) capsule 10 mg (10 mg Oral Given by Other 05/05/22 1246)  pantoprazole (PROTONIX) injection 40 mg (40 mg Intravenous Given 05/05/22 1336)  metoCLOPramide (REGLAN) injection 10 mg (10 mg Intramuscular Given 05/05/22 1232)  ondansetron (ZOFRAN-ODT) disintegrating tablet 8 mg (8 mg Oral Given 05/05/22 1234)  prochlorperazine (COMPAZINE) injection 10 mg (10 mg Intravenous Given 05/05/22 1456)    Mobility walks

## 2022-05-05 NOTE — ED Notes (Signed)
Called floor for handoff report.

## 2022-05-06 DIAGNOSIS — E785 Hyperlipidemia, unspecified: Secondary | ICD-10-CM | POA: Diagnosis present

## 2022-05-06 DIAGNOSIS — Z765 Malingerer [conscious simulation]: Secondary | ICD-10-CM | POA: Diagnosis not present

## 2022-05-06 DIAGNOSIS — E1065 Type 1 diabetes mellitus with hyperglycemia: Secondary | ICD-10-CM | POA: Diagnosis present

## 2022-05-06 DIAGNOSIS — N179 Acute kidney failure, unspecified: Secondary | ICD-10-CM | POA: Diagnosis not present

## 2022-05-06 DIAGNOSIS — K219 Gastro-esophageal reflux disease without esophagitis: Secondary | ICD-10-CM | POA: Diagnosis present

## 2022-05-06 DIAGNOSIS — Z89422 Acquired absence of other left toe(s): Secondary | ICD-10-CM | POA: Diagnosis not present

## 2022-05-06 DIAGNOSIS — K3184 Gastroparesis: Secondary | ICD-10-CM | POA: Diagnosis present

## 2022-05-06 DIAGNOSIS — Z79899 Other long term (current) drug therapy: Secondary | ICD-10-CM | POA: Diagnosis not present

## 2022-05-06 DIAGNOSIS — E860 Dehydration: Secondary | ICD-10-CM | POA: Diagnosis present

## 2022-05-06 DIAGNOSIS — I152 Hypertension secondary to endocrine disorders: Secondary | ICD-10-CM | POA: Diagnosis present

## 2022-05-06 DIAGNOSIS — G8929 Other chronic pain: Secondary | ICD-10-CM | POA: Diagnosis present

## 2022-05-06 DIAGNOSIS — E0590 Thyrotoxicosis, unspecified without thyrotoxic crisis or storm: Secondary | ICD-10-CM | POA: Diagnosis present

## 2022-05-06 DIAGNOSIS — Z888 Allergy status to other drugs, medicaments and biological substances status: Secondary | ICD-10-CM | POA: Diagnosis not present

## 2022-05-06 DIAGNOSIS — E1043 Type 1 diabetes mellitus with diabetic autonomic (poly)neuropathy: Secondary | ICD-10-CM | POA: Diagnosis present

## 2022-05-06 DIAGNOSIS — Z833 Family history of diabetes mellitus: Secondary | ICD-10-CM | POA: Diagnosis not present

## 2022-05-06 DIAGNOSIS — E1042 Type 1 diabetes mellitus with diabetic polyneuropathy: Secondary | ICD-10-CM | POA: Diagnosis present

## 2022-05-06 DIAGNOSIS — Z794 Long term (current) use of insulin: Secondary | ICD-10-CM | POA: Diagnosis not present

## 2022-05-06 LAB — GLUCOSE, CAPILLARY
Glucose-Capillary: 217 mg/dL — ABNORMAL HIGH (ref 70–99)
Glucose-Capillary: 261 mg/dL — ABNORMAL HIGH (ref 70–99)
Glucose-Capillary: 193 mg/dL — ABNORMAL HIGH (ref 70–99)
Glucose-Capillary: 192 mg/dL — ABNORMAL HIGH (ref 70–99)
Glucose-Capillary: 165 mg/dL — ABNORMAL HIGH (ref 70–99)

## 2022-05-06 LAB — CBC
HCT: 41.1 % (ref 36.0–46.0)
Hemoglobin: 12.4 g/dL (ref 12.0–15.0)
MCH: 29.2 pg (ref 26.0–34.0)
MCHC: 30.2 g/dL (ref 30.0–36.0)
MCV: 96.9 fL (ref 80.0–100.0)
Platelets: 358 10*3/uL (ref 150–400)
RBC: 4.24 MIL/uL (ref 3.87–5.11)
RDW: 13.9 % (ref 11.5–15.5)
WBC: 12.3 10*3/uL — ABNORMAL HIGH (ref 4.0–10.5)
nRBC: 0 % (ref 0.0–0.2)

## 2022-05-06 LAB — BASIC METABOLIC PANEL
Anion gap: 10 (ref 5–15)
BUN: 21 mg/dL — ABNORMAL HIGH (ref 6–20)
CO2: 20 mmol/L — ABNORMAL LOW (ref 22–32)
Calcium: 9.2 mg/dL (ref 8.9–10.3)
Chloride: 106 mmol/L (ref 98–111)
Creatinine, Ser: 1.07 mg/dL — ABNORMAL HIGH (ref 0.44–1.00)
GFR, Estimated: >60 mL/min (ref >60)
Glucose, Bld: 208 mg/dL — ABNORMAL HIGH (ref 70–99)
Potassium: 3.7 mmol/L (ref 3.5–5.1)
Sodium: 136 mmol/L (ref 135–145)

## 2022-05-06 MED ORDER — ATORVASTATIN CALCIUM 10 MG PO TABS
20.0000 mg | ORAL_TABLET | Freq: Every day | ORAL | Status: DC
  Administered 2022-05-06 – 2022-05-09 (×4): 20 mg via ORAL
  Filled 2022-05-06: qty 1
  Filled 2022-05-06 (×3): qty 2

## 2022-05-06 MED ORDER — CARVEDILOL 6.25 MG PO TABS
6.2500 mg | ORAL_TABLET | Freq: Once | ORAL | Status: AC
  Administered 2022-05-06: 6.25 mg via ORAL
  Filled 2022-05-06: qty 1

## 2022-05-06 MED ORDER — HYDRALAZINE HCL 20 MG/ML IJ SOLN: 10.0000 mg | INTRAMUSCULAR | Status: DC | PRN

## 2022-05-06 MED ORDER — SODIUM CHLORIDE 0.9 % IV SOLN: INTRAVENOUS | Status: AC

## 2022-05-06 MED ORDER — LOSARTAN POTASSIUM 50 MG PO TABS
25.0000 mg | ORAL_TABLET | Freq: Every day | ORAL | Status: DC
  Administered 2022-05-06: 25 mg via ORAL
  Filled 2022-05-06: qty 1

## 2022-05-06 MED ORDER — TRAZODONE HCL 50 MG PO TABS
50.0000 mg | ORAL_TABLET | Freq: Every evening | ORAL | Status: DC | PRN
  Administered 2022-05-06: 50 mg via ORAL
  Filled 2022-05-06: qty 1

## 2022-05-06 MED ORDER — ONDANSETRON HCL 4 MG/2ML IJ SOLN
4.0000 mg | Freq: Four times a day (QID) | INTRAMUSCULAR | Status: DC | PRN
  Administered 2022-05-06: 4 mg via INTRAVENOUS
  Filled 2022-05-06: qty 2

## 2022-05-06 MED ORDER — CARVEDILOL 6.25 MG PO TABS
6.2500 mg | ORAL_TABLET | Freq: Two times a day (BID) | ORAL | Status: DC
  Administered 2022-05-06 – 2022-05-09 (×6): 6.25 mg via ORAL
  Filled 2022-05-06 (×6): qty 1

## 2022-05-06 MED ORDER — METOPROLOL TARTRATE 5 MG/5ML IV SOLN
5.0000 mg | INTRAVENOUS | Status: DC | PRN
  Administered 2022-05-06 – 2022-05-07 (×4): 5 mg via INTRAVENOUS
  Filled 2022-05-06 (×5): qty 5

## 2022-05-06 MED ORDER — GABAPENTIN 300 MG PO CAPS
300.0000 mg | ORAL_CAPSULE | Freq: Three times a day (TID) | ORAL | Status: DC
  Administered 2022-05-06 – 2022-05-09 (×10): 300 mg via ORAL
  Filled 2022-05-06 (×10): qty 1

## 2022-05-06 MED ORDER — ACETAMINOPHEN 325 MG PO TABS: 650.0000 mg | ORAL_TABLET | Freq: Four times a day (QID) | ORAL | Status: DC | PRN

## 2022-05-06 MED ORDER — HYDROXYZINE HCL 25 MG PO TABS
25.0000 mg | ORAL_TABLET | Freq: Three times a day (TID) | ORAL | Status: DC | PRN
  Administered 2022-05-08: 25 mg via ORAL
  Filled 2022-05-06: qty 1

## 2022-05-06 MED ORDER — SENNOSIDES-DOCUSATE SODIUM 8.6-50 MG PO TABS: 1.0000 | ORAL_TABLET | Freq: Every evening | ORAL | Status: DC | PRN

## 2022-05-06 MED ORDER — IPRATROPIUM-ALBUTEROL 0.5-2.5 (3) MG/3ML IN SOLN: 3.0000 mL | RESPIRATORY_TRACT | Status: DC | PRN

## 2022-05-06 MED ORDER — GUAIFENESIN 100 MG/5ML PO LIQD: 5.0000 mL | ORAL | Status: DC | PRN

## 2022-05-06 MED ORDER — HYDROMORPHONE HCL 2 MG PO TABS
1.0000 mg | ORAL_TABLET | ORAL | Status: DC | PRN
  Administered 2022-05-06 – 2022-05-07 (×6): 1 mg via ORAL
  Filled 2022-05-06 (×6): qty 1

## 2022-05-06 NOTE — Plan of Care (Signed)

## 2022-05-06 NOTE — Progress Notes (Signed)
PROGRESS NOTE    Kelli Hudson  [id number] DOB: [birth year] DOA: 05/05/2022 PCP: Pcp, No   Brief Narrative:  32 y.o. female with medical history significant for poorly controlled DM1, drug seeking behavior and multiple hospital admission for intractable nausea and vomiting from her gastroparesis, being admitted for the same.  Patient was discharged on disability last on March 4, previous to that she had 5 ER visits in 10 days.  During her hospital admissions, efforts are made to avoid narcotic, as she is severely exhibits drug-seeking behavior.  Previously patient has shown severe drug-seeking behavior such as wandering into drug room looking for narcotics, grabbing vial of pain medication from nurses to self administer.   Assessment & Plan:  Principal Problem:   Gastroparesis Active Problems:   Diabetic gastroparesis associated with type 1 diabetes mellitus ([place])   Drug-seeking behavior   Hypertension associated with diabetes ([place])   DM type 1 (diabetes mellitus, type 1) ([place])   GERD without esophagitis   Diabetic polyneuropathy associated with type 1 diabetes mellitus ([facility])   Prolonged Q-T interval on ECG   Diabetic gastroparesis ([facility])   Refractory nausea and vomiting    Diabetic gastroparesis associated with type 1 diabetes mellitus ([place]) complicated by Drug-seeking behavior    Refractory nausea and vomiting -Patient previously seen by gastroenterology was recommended PPI, Reglan, Phenergan and Zofran.  Try and avoid narcotic use at this time.  Supportive care.  Antiemetics as needed.  IV fluids.  Acute kidney injury - From dehydration.  Admission creatinine 1.42, improving 1.07.  IV fluids.   Hypertension associated with diabetes ([place]) On home Coreg and losartan    DM type 1 (diabetes mellitus, type 1) ([place]) -will continue her basal insulin at reduced dose, and add sliding scale -On Levemir, sliding scale and Accu-Chek.  Adjust as necessary    GERD without  esophagitis -PPI IV twice daily for now    Diabetic polyneuropathy associated with type 1 diabetes mellitus ([place]) -Supportive care    Prolonged Q-T interval on ECG -Continue to monitor, improved  DVT prophylaxis: Lovenox Code Status: Full code Family Communication:     Continue hospital stay until she is able to tolerate p.o.        Body mass index is 25.24 kg/m.         Subjective: Patient states her nausea and vomiting is better therefore would like to advance her diet. She continues to insist that she wants IV Dilaudid.  I explained to her that she will not be receiving IV Dilaudid and will only give p.o. pain medications if necessary. I explained that she can get IV Toradol instead which has been ordered as needed.  Examination:  General exam: Appears calm and comfortable  Respiratory system: Clear to auscultation. Respiratory effort normal. Cardiovascular system: S1 & S2 heard, RRR. No JVD, murmurs, rubs, gallops or clicks. No pedal edema. Gastrointestinal system: Abdomen is nondistended, soft and nontender. No organomegaly or masses felt. Normal bowel sounds heard. Central nervous system: Alert and oriented. No focal neurological deficits. Extremities: Symmetric 5 x 5 power. Skin: No rashes, lesions or ulcers Psychiatry: Judgement and insight appear normal. Mood & affect appropriate.     Objective: Vitals:   05/06/22 0400 05/06/22 0500 05/06/22 0600 05/06/22 0700  BP: (!) 177/120   (!) 144/125  Pulse: (!) 117 (!) 117 (!) 121 (!) 130  Resp: (!) 0 18 20 16   Temp: 98 F (36.7 C)     TempSrc: Oral     SpO2:  98% 97% 100% 96%  Weight:      Height:        Intake/Output Summary (Last 24 hours) at 05/06/2022 0732 Last data filed at 05/05/2022 1739 Gross per 24 hour  Intake 42.99 ml  Output --  Net 42.99 ml   Filed Weights   05/05/22 1716  Weight: 75.3 kg     Data Reviewed:   CBC: Recent Labs  Lab 05/05/22 1305 05/06/22 0317  WBC 12.4* 12.3*   NEUTROABS 10.1*  --   HGB 12.3 12.4  HCT 36.8 41.1  MCV 87.4 96.9  PLT 412* [id number]   Basic Metabolic Panel: Recent Labs  Lab 05/05/22 1305 05/06/22 0317  NA 137 136  K 5.0 3.7  CL 103 106  CO2 25 20*  GLUCOSE 277* 208*  BUN 23* 21*  CREATININE 1.42* 1.07*  CALCIUM 9.6 9.2   GFR: Estimated Creatinine Clearance: 77.6 mL/min (A) (by C-G formula based on SCr of 1.07 mg/dL (H)). Liver Function Tests: Recent Labs  Lab 05/05/22 1305  AST 33  ALT 29  ALKPHOS 81  BILITOT 1.3*  PROT 8.0  ALBUMIN 3.8   Recent Labs  Lab 05/05/22 1305  LIPASE 30   No results for input(s): "AMMONIA" in the last 168 hours. Coagulation Profile: No results for input(s): "INR", "PROTIME" in the last 168 hours. Cardiac Enzymes: No results for input(s): "CKTOTAL", "CKMB", "CKMBINDEX", "TROPONINI" in the last 168 hours. BNP (last 3 results) No results for input(s): "PROBNP" in the last 8760 hours. HbA1C: No results for input(s): "HGBA1C" in the last 72 hours. CBG: Recent Labs  Lab 05/05/22 2112  GLUCAP 183*   Lipid Profile: No results for input(s): "CHOL", "HDL", "LDLCALC", "TRIG", "CHOLHDL", "LDLDIRECT" in the last 72 hours. Thyroid Function Tests: No results for input(s): "TSH", "T4TOTAL", "FREET4", "T3FREE", "THYROIDAB" in the last 72 hours. Anemia Panel: No results for input(s): "VITAMINB12", "FOLATE", "FERRITIN", "TIBC", "IRON", "RETICCTPCT" in the last 72 hours. Sepsis Labs: No results for input(s): "PROCALCITON", "LATICACIDVEN" in the last 168 hours.  Recent Results (from the past 240 hour(s))  MRSA Next Gen by PCR, Nasal     Status: None   Collection Time: 05/05/22  5:05 PM   Specimen: Nasal Mucosa; Nasal Swab  Result Value Ref Range Status   MRSA by PCR Next Gen NOT DETECTED NOT DETECTED Final    Comment: (NOTE) The GeneXpert MRSA Assay (FDA approved for NASAL specimens only), is one component of a comprehensive MRSA colonization surveillance program. It is not intended to  diagnose MRSA infection nor to guide or monitor treatment for MRSA infections. Test performance is not FDA approved in patients less than 41 years old. Performed at [facility], [place] [address], [place], [place] [address]          Radiology Studies: No results found.      Scheduled Meds:  Chlorhexidine Gluconate Cloth  6 each Topical Daily   enoxaparin (LOVENOX) injection  40 mg Subcutaneous Q24H   insulin aspart  0-15 Units Subcutaneous TID WC   insulin aspart  0-5 Units Subcutaneous QHS   insulin detemir  5 Units Subcutaneous BID   pantoprazole (PROTONIX) IV  40 mg Intravenous Q12H   scopolamine  1 patch Transdermal Q72H   Continuous Infusions:  sodium chloride 125 mL/hr at 05/06/22 0244     LOS: 0 days   Time spent= 35 mins    Shaylynn Nulty Arsenio Loader, MD [facility]  If 7PM-7AM, please contact night-coverage  05/06/2022, 7:32 AM

## 2022-05-06 NOTE — Progress Notes (Signed)
Patient ambulating on unit with staff supervision.

## 2022-05-07 DIAGNOSIS — K3184 Gastroparesis: Secondary | ICD-10-CM | POA: Diagnosis not present

## 2022-05-07 LAB — CBC
HCT: 31.5 % — ABNORMAL LOW (ref 36.0–46.0)
Hemoglobin: 9.8 g/dL — ABNORMAL LOW (ref 12.0–15.0)
MCH: 28.7 pg (ref 26.0–34.0)
MCHC: 31.1 g/dL (ref 30.0–36.0)
MCV: 92.1 fL (ref 80.0–100.0)
Platelets: 314 10*3/uL (ref 150–400)
RBC: 3.42 MIL/uL — ABNORMAL LOW (ref 3.87–5.11)
RDW: 14.3 % (ref 11.5–15.5)
WBC: 12.7 10*3/uL — ABNORMAL HIGH (ref 4.0–10.5)
nRBC: 0 % (ref 0.0–0.2)

## 2022-05-07 LAB — BASIC METABOLIC PANEL
Anion gap: 13 (ref 5–15)
BUN: 26 mg/dL — ABNORMAL HIGH (ref 6–20)
CO2: 21 mmol/L — ABNORMAL LOW (ref 22–32)
Calcium: 8.9 mg/dL (ref 8.9–10.3)
Chloride: 104 mmol/L (ref 98–111)
Creatinine, Ser: 1.72 mg/dL — ABNORMAL HIGH (ref 0.44–1.00)
GFR, Estimated: 41 mL/min — ABNORMAL LOW (ref >60)
Glucose, Bld: 195 mg/dL — ABNORMAL HIGH (ref 70–99)
Potassium: 3.6 mmol/L (ref 3.5–5.1)
Sodium: 138 mmol/L (ref 135–145)

## 2022-05-07 LAB — GLUCOSE, CAPILLARY
Glucose-Capillary: 180 mg/dL — ABNORMAL HIGH (ref 70–99)
Glucose-Capillary: 104 mg/dL — ABNORMAL HIGH (ref 70–99)
Glucose-Capillary: 194 mg/dL — ABNORMAL HIGH (ref 70–99)
Glucose-Capillary: 124 mg/dL — ABNORMAL HIGH (ref 70–99)

## 2022-05-07 LAB — MAGNESIUM: Magnesium: 1.8 mg/dL (ref 1.7–2.4)

## 2022-05-07 MED ORDER — METOCLOPRAMIDE HCL 5 MG/ML IJ SOLN
10.0000 mg | Freq: Three times a day (TID) | INTRAMUSCULAR | Status: DC
  Administered 2022-05-07 – 2022-05-09 (×6): 10 mg via INTRAVENOUS
  Filled 2022-05-07 (×6): qty 2

## 2022-05-07 NOTE — Progress Notes (Addendum)
PROGRESS NOTE  Kelli Hudson  [id number] DOB: 09-01-1990 DOA: 05/05/2022 PCP: Pcp, No   Brief Narrative: Patient is a 32 year old female with history of poorly controlled diabetes type 1, drug-seeking behavior, multiple hospitalization for intractable nausea/vomiting from gastroparesis, recently discharged from here in March 4 presented with nausea, vomiting.  Currently being managed for intractable nausea and vomiting secondary to diabetic gastroparesis.  Hospital course remarkable for AKI with creatinine trending up to 1.7.  Also observed to have persistent pain medication seeking behavior, repeatedly asking for IV Dilaudid  Assessment & Plan:  Principal Problem:   Gastroparesis Active Problems:   Diabetic gastroparesis associated with type 1 diabetes mellitus ([place])   Drug-seeking behavior   Hypertension associated with diabetes ([place])   DM type 1 (diabetes mellitus, type 1) (HCC)   GERD without esophagitis   Intractable nausea and vomiting   Diabetic polyneuropathy associated with type 1 diabetes mellitus (HCC)   Prolonged Q-T interval on ECG   Diabetic gastroparesis (HCC)   Refractory nausea and vomiting  Intractable nausea/vomiting secondary to diabetic gastroparesis: Multiple admissions due to same.  Presented again with refractory nausea and vomiting.  Recently admitted on early March.  She was previously seen by GI and was recommended PPI, Reglan, Phenergan, Zofran We will consult the patient to have small volume frequent nonfatty meals. Patient has been observed/documented to have drug-seeking behavior.  During my evaluation today, she was asking for IV Dilaudid but later after a few minutes I saw her  comfortable working on the hallway. As per RN, she was also inserting fingers into her mouth to induce vomiting. We will try to minimize narcotics as much as possible. Will continue Reglan.Diet advanced to soft  AKI: Baseline creatinine normal.  Creatinine trended up  to 1.7.  Continue IV fluid for today,check BMP tomorrow  Hypertension: Takes Coreg, losartan at home.  Losartan on hold.  Monitor blood pressure  Diabetes type 1: On Levemir, sliding scale.  Continue to monitor blood sugars.  We recommend to closely follow-up with PCP/endocrinology for management of diabetes.  GERD: Continue PPI  Hyperlipidemia: On Lipitor  Diabetic polyneuropathy: Continue supportive care.  Prolonged QTc: Improved to 435  Leukocytosis: Most likely reactive, continue to monitor, mild         DVT prophylaxis:enoxaparin (LOVENOX) injection 40 mg Start: 05/05/22 1800 SCDs Start: 05/05/22 1544     Code Status: Full Code  Family Communication: None at bedside  Patient status:Inpatient  Patient is from :Home  Anticipated discharge [id number]  Estimated DC date:1-2 days   Consultants: None  Procedures:None  Antimicrobials:  Anti-infectives (From admission, onward)    None       Subjective: Patient seen and examined at bedside today.  During my evaluation, she was lying on bed, appeared to be vomiting.  As soon as  I entered, she was asking for a dose of IV Dilaudid.  Complains of abdominal discomfort, nausea, vomiting.  After I left the room, I saw her walking comfortably and independently on the hallway  Objective: Vitals:   05/06/22 1900 05/06/22 2257 05/07/22 0252 05/07/22 0632  BP: 131/84 (!) 144/102 (!) 148/104 125/85  Pulse: (!) 127 (!) 131 (!) 121 (!) 107  Resp: 16 18 20 15   Temp:  98.7 F (37.1 C) 98.9 F (37.2 C) 98.2 F (36.8 C)  TempSrc:   Oral Oral  SpO2: 100% 100% 100% 98%  Weight:      Height:        Intake/Output Summary (Last 24 hours)  at 05/07/2022 0914 Last data filed at 05/07/2022 0112 Gross per 24 hour  Intake --  Output 325 ml  Net -325 ml   Filed Weights   05/05/22 1716  Weight: 75.3 kg    Examination:  General exam: Overall comfortable, not in distress HEENT: PERRL Respiratory system:  no wheezes or  crackles  Cardiovascular system: S1 & S2 heard, RRR.  Gastrointestinal system: Abdomen is nondistended, soft and tender?  Central nervous system: Alert and oriented Extremities: No edema, no clubbing ,no cyanosis Skin: No rashes, no ulcers,no icterus     Data Reviewed: I have personally reviewed following labs and imaging studies  CBC: Recent Labs  Lab 05/05/22 1305 05/06/22 0317 05/07/22 0601  WBC 12.4* 12.3* 12.7*  NEUTROABS 10.1*  --   --   HGB 12.3 12.4 9.8*  HCT 36.8 41.1 31.5*  MCV 87.4 96.9 92.1  PLT 412* 358 [id number]   Basic Metabolic Panel: Recent Labs  Lab 05/05/22 1305 05/06/22 0317 05/07/22 0601  NA 137 136 138  K 5.0 3.7 3.6  CL 103 106 104  CO2 25 20* 21*  GLUCOSE 277* 208* 195*  BUN 23* 21* 26*  CREATININE 1.42* 1.07* 1.72*  CALCIUM 9.6 9.2 8.9  MG  --   --  1.8     Recent Results (from the past 240 hour(s))  MRSA Next Gen by PCR, Nasal     Status: None   Collection Time: 05/05/22  5:05 PM   Specimen: Nasal Mucosa; Nasal Swab  Result Value Ref Range Status   MRSA by PCR Next Gen NOT DETECTED NOT DETECTED Final    Comment: (NOTE) The GeneXpert MRSA Assay (FDA approved for NASAL specimens only), is one component of a comprehensive MRSA colonization surveillance program. It is not intended to diagnose MRSA infection nor to guide or monitor treatment for MRSA infections. Test performance is not FDA approved in patients less than 64 years old. Performed at [facility], [place] [address], [place], [place] [address]      Radiology Studies: No results found.  Scheduled Meds:  atorvastatin  20 mg Oral Daily   carvedilol  6.25 mg Oral BID WC   Chlorhexidine Gluconate Cloth  6 each Topical Daily   enoxaparin (LOVENOX) injection  40 mg Subcutaneous Q24H   gabapentin  300 mg Oral TID   insulin aspart  0-15 Units Subcutaneous TID WC   insulin aspart  0-5 Units Subcutaneous QHS   insulin detemir  5 Units Subcutaneous BID   pantoprazole  (PROTONIX) IV  40 mg Intravenous Q12H   scopolamine  1 patch Transdermal Q72H   Continuous Infusions:  sodium chloride 125 mL/hr at 05/06/22 2035     LOS: 1 day   Shelly Coss, MD [facility] P3/20/2024, 9:14 AM

## 2022-05-08 ENCOUNTER — Encounter (HOSPITAL_COMMUNITY): Payer: Self-pay | Admitting: Internal Medicine

## 2022-05-08 DIAGNOSIS — K3184 Gastroparesis: Secondary | ICD-10-CM | POA: Diagnosis not present

## 2022-05-08 LAB — CBC
HCT: 33.8 % — ABNORMAL LOW (ref 36.0–46.0)
Hemoglobin: 10.5 g/dL — ABNORMAL LOW (ref 12.0–15.0)
MCH: 29 pg (ref 26.0–34.0)
MCHC: 31.1 g/dL (ref 30.0–36.0)
MCV: 93.4 fL (ref 80.0–100.0)
Platelets: 333 10*3/uL (ref 150–400)
RBC: 3.62 MIL/uL — ABNORMAL LOW (ref 3.87–5.11)
RDW: 14 % (ref 11.5–15.5)
WBC: 12.7 10*3/uL — ABNORMAL HIGH (ref 4.0–10.5)
nRBC: 0 % (ref 0.0–0.2)

## 2022-05-08 LAB — BASIC METABOLIC PANEL
Anion gap: 7 (ref 5–15)
BUN: 27 mg/dL — ABNORMAL HIGH (ref 6–20)
CO2: 20 mmol/L — ABNORMAL LOW (ref 22–32)
Calcium: 8.9 mg/dL (ref 8.9–10.3)
Chloride: 107 mmol/L (ref 98–111)
Creatinine, Ser: 1.3 mg/dL — ABNORMAL HIGH (ref 0.44–1.00)
GFR, Estimated: 57 mL/min — ABNORMAL LOW (ref >60)
Glucose, Bld: 82 mg/dL (ref 70–99)
Potassium: 3.8 mmol/L (ref 3.5–5.1)
Sodium: 134 mmol/L — ABNORMAL LOW (ref 135–145)

## 2022-05-08 LAB — GLUCOSE, CAPILLARY
Glucose-Capillary: 93 mg/dL (ref 70–99)
Glucose-Capillary: 303 mg/dL — ABNORMAL HIGH (ref 70–99)
Glucose-Capillary: 161 mg/dL — ABNORMAL HIGH (ref 70–99)
Glucose-Capillary: 201 mg/dL — ABNORMAL HIGH (ref 70–99)

## 2022-05-08 LAB — MAGNESIUM: Magnesium: 1.6 mg/dL — ABNORMAL LOW (ref 1.7–2.4)

## 2022-05-08 MED ORDER — MAGNESIUM SULFATE 2 GM/50ML IV SOLN
2.0000 g | Freq: Once | INTRAVENOUS | Status: AC
  Administered 2022-05-08: 2 g via INTRAVENOUS
  Filled 2022-05-08: qty 50

## 2022-05-08 MED ORDER — SODIUM CHLORIDE 0.9 % IV SOLN: INTRAVENOUS | Status: DC

## 2022-05-08 NOTE — Plan of Care (Signed)
  Problem: Education: Goal: Knowledge of General Education information will improve Description: Including pain rating scale, medication(s)/side effects and non-pharmacologic comfort measures Outcome: Progressing   Problem: Health Behavior/Discharge Planning: Goal: Ability to manage health-related needs will improve Outcome: Progressing   Problem: Clinical Measurements: Goal: Ability to maintain clinical measurements within normal limits will improve Outcome: Progressing Goal: Will remain free from infection Outcome: Progressing Goal: Diagnostic test results will improve Outcome: Progressing Goal: Respiratory complications will improve Outcome: Progressing Goal: Cardiovascular complication will be avoided Outcome: Progressing   Problem: Activity: Goal: Risk for activity intolerance will decrease Outcome: Progressing   Problem: Coping: Goal: Level of anxiety will decrease Outcome: Progressing   Problem: Elimination: Goal: Will not experience complications related to bowel motility Outcome: Progressing Goal: Will not experience complications related to urinary retention Outcome: Progressing   Problem: Pain Managment: Goal: General experience of comfort will improve Outcome: Progressing   Problem: Safety: Goal: Ability to remain free from injury will improve Outcome: Progressing   Problem: Skin Integrity: Goal: Risk for impaired skin integrity will decrease Outcome: Progressing   Patient remains alert and oriented, poor PO intake ongoing due to N/V.

## 2022-05-08 NOTE — Progress Notes (Signed)
  Transition of Care [facility]) Screening Note   Patient Details  Name: Kelli Hudson Date of Birth: [birth year]   Transition of Care [facility]) CM/SW Contact:    Vassie Moselle, LCSW Phone Number: 05/08/2022, 9:45 AM    Transition of Care [facility]) has reviewed patient and no TOC needs have been identified at this time. We will continue to monitor patient advancement through interdisciplinary progression rounds. If new patient transition needs arise, please place a TOC consult.

## 2022-05-08 NOTE — Inpatient Diabetes Management (Signed)
Inpatient Diabetes Program Recommendations  AACE/ADA: New Consensus Statement on Inpatient Glycemic Control (2015)  Target Ranges:  Prepandial:   less than 140 mg/dL      Peak postprandial:   less than 180 mg/dL (1-2 hours)      Critically ill patients:  140 - 180 mg/dL   Lab Results  Component Value Date   GLUCAP 303 (H) 05/08/2022   HGBA1C 11.2 (H) 03/23/2022    Review of Glycemic Control  Diabetes history: DM1 Outpatient Diabetes medications: Levemir 16 BID, Novolin R 0-15 TID Current orders for Inpatient glycemic control: Levemir 5 BID, Novolog 0-15 TID with meals and 0-5 HS  HgbA1C - 11.2% Post-prandials elevated, may benefit from meal coverage insulin  Inpatient Diabetes Program Recommendations:    Consider Novolog 2 units TID with meals if eating > 50%  Diabetes Coordinators have spoke with pt on multiple occasions regarding her HgbA1C and importance of f/u with Endo.  Continue to follow.  Thank you. Lorenda Peck, RD, LDN, [place] Inpatient Diabetes Coordinator [phone]

## 2022-05-08 NOTE — Progress Notes (Signed)
PROGRESS NOTE  Kelli Hudson  [id number] DOB: 07-Feb-1991 DOA: 05/05/2022 PCP: Pcp, No   Brief Narrative: Patient is a 32 year old female with history of poorly controlled diabetes type 1, drug-seeking behavior, multiple hospitalization for intractable nausea/vomiting from gastroparesis, recently discharged from here in March 4 presented with nausea, vomiting.  Currently being managed for intractable nausea and vomiting secondary to diabetic gastroparesis.  Hospital course remarkable for AKI with creatinine trending up to 1.7.  Also observed to have persistent pain medication seeking behavior, repeatedly asking for IV Dilaudid.  Abdomen pain is improving with IV Reglan, plan to continue soft diet today with discharge tomorrow to home  Assessment & Plan:  Principal Problem:   Gastroparesis Active Problems:   Diabetic gastroparesis associated with type 1 diabetes mellitus ([place])   Drug-seeking behavior   Hypertension associated with diabetes ([place])   DM type 1 (diabetes mellitus, type 1) (HCC)   GERD without esophagitis   Intractable nausea and vomiting   Diabetic polyneuropathy associated with type 1 diabetes mellitus (HCC)   Prolonged Q-T interval on ECG   Diabetic gastroparesis (HCC)   Refractory nausea and vomiting  Intractable nausea/vomiting secondary to diabetic gastroparesis: Multiple admissions due to same.  Presented again with refractory nausea and vomiting.  Recently admitted on early March.  She was previously seen by GI and was recommended PPI, Reglan, Phenergan, Zofran We will counsel the patient to have small volume frequent nonfatty meals. Patient has been observed/documented to have drug-seeking behavior.  She was asking for IV Dilaudid We will try to minimize narcotics as much as possible. Will continue Reglan.Diet advanced to soft, plan to continue the same today  AKI: Baseline creatinine normal.  Creatinine trended up to 1.7 now again improving.  Continue IV  fluid for today,check BMP tomorrow  Hypertension: Takes Coreg, losartan at home.  Losartan on hold.  Monitor blood pressure  Diabetes type 1: On Levemir, sliding scale.  Continue to monitor blood sugars.  We recommend to closely follow-up with PCP/endocrinology for management of diabetes.  GERD: Continue PPI  Hyperlipidemia: On Lipitor  Diabetic polyneuropathy: Continue supportive care.  Prolonged QTc: Improved to 435  Leukocytosis: Most likely reactive, continue to monitor, mild         DVT prophylaxis:enoxaparin (LOVENOX) injection 40 mg Start: 05/05/22 1800 SCDs Start: 05/05/22 1544     Code Status: Full Code  Family Communication: None at bedside  Patient status:Inpatient  Patient is from :Home  Anticipated discharge [id number]  Estimated DC date: Tomorrow   Consultants: None  Procedures:None  Antimicrobials:  Anti-infectives (From admission, onward)    None       Subjective: Patient seen and examined at bedside today.  She feels better.  Her abdominal pain, nausea and vomiting are all better today.  She wants to stay 1 more day with soft diet and expecting to go to home tomorrow  Objective: Vitals:   05/07/22 1201 05/07/22 1953 05/08/22 0541 05/08/22 0900  BP: (!) 146/86 (!) 129/104 (!) 138/104 (!) 130/98  Pulse: 99 (!) 117 95 90  Resp: (!) 22 18    Temp: 98.3 F (36.8 C) 99.5 F (37.5 C) 97.9 F (36.6 C)   TempSrc: Oral  Oral   SpO2: 100% 100% 100%   Weight:      Height:        Intake/Output Summary (Last 24 hours) at 05/08/2022 1150 Last data filed at 05/07/2022 1839 Gross per 24 hour  Intake 946 ml  Output --  Net 946 ml  Filed Weights   05/05/22 1716  Weight: 75.3 kg    Examination:  General exam: Overall comfortable, not in distress HEENT: PERRL Respiratory system:  no wheezes or crackles  Cardiovascular system: S1 & S2 heard, RRR.  Gastrointestinal system: Abdomen is nondistended, soft, has mild generalized tenderness   Central nervous system: Alert and oriented Extremities: No edema, no clubbing ,no cyanosis Skin: No rashes, no ulcers,no icterus      Data Reviewed: I have personally reviewed following labs and imaging studies  CBC: Recent Labs  Lab 05/05/22 1305 05/06/22 0317 05/07/22 0601 05/08/22 0532  WBC 12.4* 12.3* 12.7* 12.7*  NEUTROABS 10.1*  --   --   --   HGB 12.3 12.4 9.8* 10.5*  HCT 36.8 41.1 31.5* 33.8*  MCV 87.4 96.9 92.1 93.4  PLT 412* 358 314 0000000   Basic Metabolic Panel: Recent Labs  Lab 05/05/22 1305 05/06/22 0317 05/07/22 0601 05/08/22 0532  NA 137 136 138 134*  K 5.0 3.7 3.6 3.8  CL 103 106 104 107  CO2 25 20* 21* 20*  GLUCOSE 277* 208* 195* 82  BUN 23* 21* 26* 27*  CREATININE 1.42* 1.07* 1.72* 1.30*  CALCIUM 9.6 9.2 8.9 8.9  MG  --   --  1.8 1.6*     Recent Results (from the past 240 hour(s))  MRSA Next Gen by PCR, Nasal     Status: None   Collection Time: 05/05/22  5:05 PM   Specimen: Nasal Mucosa; Nasal Swab  Result Value Ref Range Status   MRSA by PCR Next Gen NOT DETECTED NOT DETECTED Final    Comment: (NOTE) The GeneXpert MRSA Assay (FDA approved for NASAL specimens only), is one component of a comprehensive MRSA colonization surveillance program. It is not intended to diagnose MRSA infection nor to guide or monitor treatment for MRSA infections. Test performance is not FDA approved in patients less than 41 years old. Performed at [facility], [place] [address] [place], [place] [address]      Radiology Studies: No results found.  Scheduled Meds:  atorvastatin  20 mg Oral Daily   carvedilol  6.25 mg Oral BID WC   enoxaparin (LOVENOX) injection  40 mg Subcutaneous Q24H   gabapentin  300 mg Oral TID   insulin aspart  0-15 Units Subcutaneous TID WC   insulin aspart  0-5 Units Subcutaneous QHS   insulin detemir  5 Units Subcutaneous BID   metoCLOPramide (REGLAN) injection  10 mg Intravenous Q8H   pantoprazole (PROTONIX) IV   40 mg Intravenous Q12H   scopolamine  1 patch Transdermal Q72H   Continuous Infusions:     LOS: 2 days   Shelly Coss, MD [facility] P3/21/2024, 11:50 AM

## 2022-05-09 ENCOUNTER — Other Ambulatory Visit (HOSPITAL_COMMUNITY): Payer: Self-pay

## 2022-05-09 DIAGNOSIS — K3184 Gastroparesis: Secondary | ICD-10-CM | POA: Diagnosis not present

## 2022-05-09 LAB — BASIC METABOLIC PANEL
Anion gap: 7 (ref 5–15)
BUN: 26 mg/dL — ABNORMAL HIGH (ref 6–20)
CO2: 19 mmol/L — ABNORMAL LOW (ref 22–32)
Calcium: 8.9 mg/dL (ref 8.9–10.3)
Chloride: 108 mmol/L (ref 98–111)
Creatinine, Ser: 1.19 mg/dL — ABNORMAL HIGH (ref 0.44–1.00)
GFR, Estimated: >60 mL/min (ref >60)
Glucose, Bld: 166 mg/dL — ABNORMAL HIGH (ref 70–99)
Potassium: 4.5 mmol/L (ref 3.5–5.1)
Sodium: 134 mmol/L — ABNORMAL LOW (ref 135–145)

## 2022-05-09 LAB — CBC
HCT: 34 % — ABNORMAL LOW (ref 36.0–46.0)
Hemoglobin: 11.2 g/dL — ABNORMAL LOW (ref 12.0–15.0)
MCH: 29.4 pg (ref 26.0–34.0)
MCHC: 32.9 g/dL (ref 30.0–36.0)
MCV: 89.2 fL (ref 80.0–100.0)
Platelets: 261 10*3/uL (ref 150–400)
RBC: 3.81 MIL/uL — ABNORMAL LOW (ref 3.87–5.11)
RDW: 13.7 % (ref 11.5–15.5)
WBC: 11.1 10*3/uL — ABNORMAL HIGH (ref 4.0–10.5)
nRBC: 0 % (ref 0.0–0.2)

## 2022-05-09 LAB — GLUCOSE, CAPILLARY: Glucose-Capillary: 241 mg/dL — ABNORMAL HIGH (ref 70–99)

## 2022-05-09 MED ORDER — LOSARTAN POTASSIUM 25 MG PO TABS
25.0000 mg | ORAL_TABLET | Freq: Every day | ORAL | 1 refills | Status: DC
  Filled 2022-05-09: qty 30, 30d supply, fill #0

## 2022-05-09 MED ORDER — LEVEMIR FLEXTOUCH 100 UNIT/ML ~~LOC~~ SOPN
16.0000 [IU] | PEN_INJECTOR | Freq: Two times a day (BID) | SUBCUTANEOUS | 1 refills | Status: DC
  Filled 2022-05-09: qty 18, 56d supply, fill #0

## 2022-05-09 MED ORDER — PANTOPRAZOLE SODIUM 40 MG PO TBEC
40.0000 mg | DELAYED_RELEASE_TABLET | Freq: Two times a day (BID) | ORAL | Status: DC
  Administered 2022-05-09: 40 mg via ORAL
  Filled 2022-05-09: qty 1

## 2022-05-09 MED ORDER — INSULIN REGULAR HUMAN 100 UNIT/ML IJ SOLN
0.0000 [IU] | INTRAMUSCULAR | 1 refills | Status: DC
  Filled 2022-05-09: qty 10, 66d supply, fill #0

## 2022-05-09 MED ORDER — GABAPENTIN 300 MG PO CAPS
300.0000 mg | ORAL_CAPSULE | Freq: Three times a day (TID) | ORAL | 0 refills | Status: DC
  Filled 2022-05-09: qty 90, 30d supply, fill #0

## 2022-05-09 MED ORDER — METOCLOPRAMIDE HCL 10 MG PO TABS
10.0000 mg | ORAL_TABLET | Freq: Three times a day (TID) | ORAL | 0 refills | Status: DC | PRN
  Filled 2022-05-09: qty 30, 10d supply, fill #0

## 2022-05-09 NOTE — Plan of Care (Signed)

## 2022-05-09 NOTE — Inpatient Diabetes Management (Signed)
Inpatient Diabetes Program Recommendations  AACE/ADA: New Consensus Statement on Inpatient Glycemic Control (2015)  Target Ranges:  Prepandial:   less than 140 mg/dL      Peak postprandial:   less than 180 mg/dL (1-2 hours)      Critically ill patients:  140 - 180 mg/dL   Lab Results  Component Value Date   GLUCAP 241 (H) 05/09/2022   HGBA1C 11.2 (H) 03/23/2022    Review of Glycemic Control  Latest Reference Range & Units 05/08/22 08:03 05/08/22 11:41 05/08/22 16:33 05/08/22 20:56 05/09/22 07:44  Glucose-Capillary 70 - 99 mg/dL 93 303 (H) 161 (H) 201 (H) 241 (H)    Latest Reference Range & Units 05/08/22 08:03 05/08/22 11:41 05/08/22 16:33 05/08/22 20:56 05/09/22 07:44  Glucose-Capillary 70 - 99 mg/dL 93 303 (H) 161 (H) 201 (H) 241 (H)   Diabetes history: DM1 Outpatient Diabetes medications: Levemir 16 BID, Novolin R 0-15 TID Current orders for Inpatient glycemic control:  Levemir 5 BID Novolog 0-15 TID with meals and 0-5 HS  HgbA1C - 11.2% Post-prandials elevated, may benefit from meal coverage insulin  Inpatient Diabetes Program Recommendations:    -   Consider adding Novolog 2 units TID with meals if eating > 50%  Diabetes Coordinators have spoke with pt on multiple occasions regarding her HgbA1C and importance of f/u with Endo.  Continue to follow.  Thanks,  Tama Headings RN, MSN, BC-ADM Inpatient Diabetes Coordinator Team Pager [phone] (8a-5p)

## 2022-05-09 NOTE — TOC Transition Note (Signed)
Transition of Care [facility]) - CM/SW Discharge Note   Patient Details  Name: Kelli Hudson MRN: [id number] Date of Birth: [birth year]  Transition of Care [facility]) CM/SW Contact:  Vassie Moselle, LCSW Phone Number: 05/09/2022, 10:16 AM   Clinical Narrative:    Met with pt to discuss need for PCP and community follow up. Pt shares she had a PCP in the past however, since having Medicaid has not had PCP. Pt agreeable to having PCP information added to follow up. CSW encouraged pt to call her Medicaid case worker to determine if PCP has been assigned to her.  Information added to AVS.    Final next level of care: Home/Self Care Barriers to Discharge: No Barriers Identified   Patient Goals and CMS Choice CMS Medicare.gov Compare Post Acute Care list provided to:: Patient Choice offered to / list presented to : Patient  Discharge Placement                         Discharge Plan and Services Additional resources added to the After Visit Summary for                  DME Arranged: N/A DME Agency: NA                  Social Determinants of Health (SDOH) Interventions SDOH Screenings   Food Insecurity: No Food Insecurity (05/08/2022)  Housing: Low Risk  (05/08/2022)  Transportation Needs: No Transportation Needs (05/08/2022)  Utilities: Not At Risk (05/08/2022)  Alcohol Screen: Low Risk  (05/29/2021)  Depression (PHQ2-9): Low Risk  (05/29/2021)  Recent Concern: Depression (PHQ2-9) - Medium Risk (04/10/2021)  Financial Resource Strain: Low Risk  (05/29/2021)  Social Connections: Moderately Integrated (05/29/2021)  Tobacco Use: Low Risk  (05/08/2022)     Readmission Risk Interventions    05/08/2022    9:45 AM 03/24/2022    3:33 PM 12/12/2021    8:59 AM  Readmission Risk Prevention Plan  Transportation Screening Complete Complete Complete  Medication Review ([place]) Complete Complete Complete  PCP or Specialist appointment within 3-5 days of discharge  Complete Complete Complete  HRI or Home Care Consult Complete Complete Complete  SW Recovery Care/Counseling Consult Complete Complete Complete  Palliative Care Screening Not Applicable Not Applicable Not [place] Not Applicable Not Applicable Not Applicable

## 2022-05-09 NOTE — Progress Notes (Signed)
Patient discharge instructions reviewed with this nurse without questions/concerns at this time. All personal belongings accounted for, IV removed without complication. Patient called [facility] for transportation home. Patient transported via wheelchair to waiting area by NT.

## 2022-05-09 NOTE — Discharge Summary (Addendum)
\ Physician Discharge Summary  Kelli Hudson [id number] DOB: 1990-06-10 DOA: 05/05/2022  PCP: Pcp, No  Admit date: 05/05/2022 Discharge date: 05/09/2022  Admitted From: Home Disposition:  Home  Discharge Condition:Stable CODE STATUS:FULL Diet recommendation: Carb Modified   Brief/Interim Summary: Patient is a 32 year old female with history of poorly controlled diabetes type 1, drug-seeking behavior, multiple hospitalization for intractable nausea/vomiting from gastroparesis, recently discharged from here in March 4 presented with nausea, vomiting.  Currently being managed for intractable nausea and vomiting secondary to diabetic gastroparesis.  Hospital course remarkable for AKI with creatinine trending up to 1.7.  Also observed to have persistent pain medication seeking behavior, repeatedly asking for IV Dilaudid.  Abdomen pain improved with IV Reglan.  She tolerated soft diet, diet advanced to carb consistent.  She feels ready to go home today.  Following problems were addressed during the hospitalization:  Intractable nausea/vomiting secondary to diabetic gastroparesis: Multiple admissions due to same.  Presented again with refractory nausea and vomiting.  Recently admitted on early March.  She was previously seen by GI and was recommended PPI, Reglan, Phenergan, Zofran We counseled the patient to have small volume frequent nonfatty meals. Patient has been observed/documented to have drug-seeking behavior.  She was asking for IV Dilaudid We tried to  minimize narcotics as much as possible.  She is tolerating solid diet now. Prescribed Reglan.   AKI: Baseline creatinine normal.  Creatinine trended up to 1.7 now again improved with fluid and close to baseline   Hypertension: Blood pressure remains stable/soft.  Will continue losartan because of her history of diabetes but will hold Coreg for now   Diabetes type 1: On Levemir, sliding scale at home.  Continue to monitor blood  sugars.  We recommend to closely follow-up with PCP/endocrinology for management of diabetes.   GERD: Continue PPI   Hyperlipidemia: On Lipitor   Diabetic polyneuropathy: On gabapentin   Prolonged QTc: Improved to 435   Leukocytosis: Most likely reactive,  mild  Multiple readmissions: Patient has been observed to have been admitted multiple times for the same problem.  I have requested her to find a PCP as soon as possible.  I have requested social work to help with this as well   Discharge Diagnoses:  Principal Problem:   Gastroparesis Active Problems:   Diabetic gastroparesis associated with type 1 diabetes mellitus ([place])   Drug-seeking behavior   Hypertension associated with diabetes ([place])   DM type 1 (diabetes mellitus, type 1) ([place])   GERD without esophagitis   Intractable nausea and vomiting   Diabetic polyneuropathy associated with type 1 diabetes mellitus ([facility])   Prolonged Q-T interval on ECG   Diabetic gastroparesis ([facility])   Refractory nausea and vomiting    Discharge Instructions  Discharge Instructions     Diet Carb Modified   Complete by: As directed    Discharge instructions   Complete by: As directed    1)Please take prescribed medications as instructed 2)Follow up with a PCP in a week to establish care about the management of her diabetes 3) monitor blood sugars at home.  Take insulins as prescribed 4) monitor blood pressure at home.  Continue losartan.  We have held [place].  If your blood pressure goes high up again, you may need to restart Coreg   Increase activity slowly   Complete by: As directed    No wound care   Complete by: As directed       Allergies as of 05/09/2022  Reactions   Lisinopril Cough   Breakout in rash        Medication List     STOP taking these medications    carvedilol 6.25 MG tablet Commonly known as: COREG       TAKE these medications    atorvastatin 20 MG tablet Commonly known as: LIPITOR Take 1  tablet (20 mg total) by mouth daily.   Dexcom G6 Transmitter Misc Change every 90 days   gabapentin 300 MG capsule Commonly known as: NEURONTIN Take 1 capsule (300 mg total) by mouth 3 (three) times daily. What changed: Another medication with the same name was removed. Continue taking this medication, and follow the directions you see here.   hydrOXYzine 25 MG capsule Commonly known as: VISTARIL Take 1 capsule (25 mg total) by mouth every 8 (eight) hours as needed. What changed: reasons to take this   insulin regular 100 units/mL injection Commonly known as: NovoLIN R Inject 0-0.15 mLs (0-15 Units total) into the skin See admin instructions. Inject 0-15 units into the skin three times a day with meals, per sliding scale   Levemir FlexTouch 100 UNIT/ML FlexPen Generic drug: insulin detemir Inject 16 Units into the skin 2 (two) times daily.   losartan 25 MG tablet Commonly known as: COZAAR Take 1 tablet (25 mg total) by mouth daily.   metoCLOPramide 10 MG tablet Commonly known as: REGLAN Take 1 tablet (10 mg total) by mouth every 8 (eight) hours as needed for nausea.   ondansetron 4 MG tablet Commonly known as: ZOFRAN Take 1 tablet (4 mg total) by mouth every 8 (eight) hours as needed for nausea or vomiting.   pantoprazole 40 MG tablet Commonly known as: PROTONIX Take 1 tablet (40 mg total) by mouth 2 (two) times daily before a meal.   promethazine 25 MG suppository Commonly known as: PHENERGAN Place 1 suppository (25 mg total) rectally every 6 (six) hours as needed for nausea or vomiting.        Allergies  Allergen Reactions   Lisinopril Cough    Breakout in rash    Consultations: None   Procedures/Studies: No results found.    Subjective: Patient seen and examined at bedside today.  Feels better today.  Denies any abdominal pain, nausea or vomiting.  Tolerating solid diet.  Medically stable for discharge  Discharge Exam: Vitals:   05/09/22 0546  05/09/22 0927  BP: 113/69 124/78  Pulse: 92 (!) 101  Resp: 15 18  Temp: 98.4 F (36.9 C) 99.2 F (37.3 C)  SpO2: 100% 100%   Vitals:   05/08/22 1700 05/08/22 2243 05/09/22 0546 05/09/22 0927  BP: 108/62 126/81 113/69 124/78  Pulse: 95 97 92 (!) 101  Resp: 18 20 15 18   Temp:  98.2 F (36.8 C) 98.4 F (36.9 C) 99.2 F (37.3 C)  TempSrc:  Oral  Oral  SpO2:  100% 100% 100%  Weight:      Height:        General: Pt is alert, awake, not in acute distress Cardiovascular: RRR, S1/S2 +, no rubs, no gallops Respiratory: CTA bilaterally, no wheezing, no rhonchi Abdominal: Soft, NT, ND, bowel sounds + Extremities: no edema, no cyanosis    The results of significant diagnostics from this hospitalization (including imaging, microbiology, ancillary and laboratory) are listed below for reference.     Microbiology: Recent Results (from the past 240 hour(s))  MRSA Next Gen by PCR, Nasal     Status: None   Collection Time: 05/05/22  5:05 PM   Specimen: Nasal Mucosa; Nasal Swab  Result Value Ref Range Status   MRSA by PCR Next Gen NOT DETECTED NOT DETECTED Final    Comment: (NOTE) The GeneXpert MRSA Assay (FDA approved for NASAL specimens only), is one component of a comprehensive MRSA colonization surveillance program. It is not intended to diagnose MRSA infection nor to guide or monitor treatment for MRSA infections. Test performance is not FDA approved in patients less than 24 years old. Performed at [facility], [place] [address], [place], [place] [address]      Labs: BNP (last 3 results) No results for input(s): "BNP" in the last 8760 hours. Basic Metabolic Panel: Recent Labs  Lab 05/05/22 1305 05/06/22 0317 05/07/22 0601 05/08/22 0532 05/09/22 0612  NA 137 136 138 134* 134*  K 5.0 3.7 3.6 3.8 4.5  CL 103 106 104 107 108  CO2 25 20* 21* 20* 19*  GLUCOSE 277* 208* 195* 82 166*  BUN 23* 21* 26* 27* 26*  CREATININE 1.42* 1.07* 1.72* 1.30* 1.19*   CALCIUM 9.6 9.2 8.9 8.9 8.9  MG  --   --  1.8 1.6*  --    Liver Function Tests: Recent Labs  Lab 05/05/22 1305  AST 33  ALT 29  ALKPHOS 81  BILITOT 1.3*  PROT 8.0  ALBUMIN 3.8   Recent Labs  Lab 05/05/22 1305  LIPASE 30   No results for input(s): "AMMONIA" in the last 168 hours. CBC: Recent Labs  Lab 05/05/22 1305 05/06/22 0317 05/07/22 0601 05/08/22 0532 05/09/22 0612  WBC 12.4* 12.3* 12.7* 12.7* 11.1*  NEUTROABS 10.1*  --   --   --   --   HGB 12.3 12.4 9.8* 10.5* 11.2*  HCT 36.8 41.1 31.5* 33.8* 34.0*  MCV 87.4 96.9 92.1 93.4 89.2  PLT 412* 358 314 333 261   Cardiac Enzymes: No results for input(s): "CKTOTAL", "CKMB", "CKMBINDEX", "TROPONINI" in the last 168 hours. BNP: Invalid input(s): "POCBNP" CBG: Recent Labs  Lab 05/08/22 0803 05/08/22 1141 05/08/22 1633 05/08/22 2056 05/09/22 0744  GLUCAP 93 303* 161* 201* 241*   D-Dimer No results for input(s): "DDIMER" in the last 72 hours. Hgb A1c No results for input(s): "HGBA1C" in the last 72 hours. Lipid Profile No results for input(s): "CHOL", "HDL", "LDLCALC", "TRIG", "CHOLHDL", "LDLDIRECT" in the last 72 hours. Thyroid function studies No results for input(s): "TSH", "T4TOTAL", "T3FREE", "THYROIDAB" in the last 72 hours.  Invalid input(s): "FREET3" Anemia work up No results for input(s): "VITAMINB12", "FOLATE", "FERRITIN", "TIBC", "IRON", "RETICCTPCT" in the last 72 hours. Urinalysis    Component Value Date/Time   COLORURINE YELLOW 05/05/2022 1416   APPEARANCEUR CLEAR 05/05/2022 1416   LABSPEC 1.016 05/05/2022 1416   PHURINE 6.0 05/05/2022 1416   GLUCOSEU >=500 (A) 05/05/2022 1416   GLUCOSEU NEGATIVE 05/22/2020 1417   HGBUR SMALL (A) 05/05/2022 1416   BILIRUBINUR NEGATIVE 05/05/2022 1416   KETONESUR NEGATIVE 05/05/2022 1416   PROTEINUR >=300 (A) 05/05/2022 1416   UROBILINOGEN 0.2 05/22/2020 1417   NITRITE NEGATIVE 05/05/2022 1416   LEUKOCYTESUR NEGATIVE 05/05/2022 1416   Sepsis  Labs Recent Labs  Lab 05/06/22 0317 05/07/22 0601 05/08/22 0532 05/09/22 0612  WBC 12.3* 12.7* 12.7* 11.1*   Microbiology Recent Results (from the past 240 hour(s))  MRSA Next Gen by PCR, Nasal     Status: None   Collection Time: 05/05/22  5:05 PM   Specimen: Nasal Mucosa; Nasal Swab  Result Value Ref Range Status   MRSA  by PCR Next Gen NOT DETECTED NOT DETECTED Final    Comment: (NOTE) The GeneXpert MRSA Assay (FDA approved for NASAL specimens only), is one component of a comprehensive MRSA colonization surveillance program. It is not intended to diagnose MRSA infection nor to guide or monitor treatment for MRSA infections. Test performance is not FDA approved in patients less than 45 years old. Performed at [facility], [place] [address] [place], [place] [address]     Please note: You were cared for by a hospitalist during your hospital stay. Once you are discharged, your primary care physician will handle any further medical issues. Please note that NO REFILLS for any discharge medications will be authorized once you are discharged, as it is imperative that you return to your primary care physician (or establish a relationship with a primary care physician if you do not have one) for your post hospital discharge needs so that they can reassess your need for medications and monitor your lab values.    Time coordinating discharge: 40 minutes  SIGNED:   Shelly Coss, MD  [facility] 05/09/2022, 10:01 AM Pager [phone]  If 7PM-7AM, please contact night-coverage [email or web address] Password [email or web address]

## 2022-05-10 ENCOUNTER — Other Ambulatory Visit (HOSPITAL_COMMUNITY): Payer: Self-pay

## 2022-05-12 NOTE — Progress Notes (Deleted)
Name: Kelli Hudson  MRN/ DOB: [id number], 07-Aug-1990   Age/ Sex: 32 y.o., female    PCP: Pcp, No   Reason for Endocrinology Evaluation: Type 1 Diabetes Mellitus     Date of Initial Endocrinology Visit: 12/18/2020    PATIENT IDENTIFIER: [name] is a 32 y.o. female with a past medical history of T1DM, and gastroparesis. The patient presented for initial endocrinology clinic visit on 12/18/2020 for consultative assistance with her diabetes management.     HPI: [name] was   Diagnosed with DM 2008 ( age 25) .Hemoglobin A1c has ranged from 9.7% in 2017, peaking at 13.9% in 2021.  Started insulin pump 01/2021  Sees Dr. Jacqualyn Posey podiatry   ON Nexplanon    THYROID HISTORY: Patient has been noted with thyromegaly during physical exam, which prompted a thyroid ultrasound on 03/15/2020 revealing multinodular goiter.  Two of the nodules met criteria for FNA.  Patient was lost to follow-up.  Repeat FNA has been ordered 03/2021  SUBJECTIVE:   During the last visit (04/18/2021): A1c 9.9%   Today (05/12/22): [name] is here for a follow up on diabetes management. She has NOT been to our clinic in a year.  She checks her blood sugars multiple  times daily through CGM. The patient has not  had hypoglycemic episodes since the last clinic visit.   Since her last visit here she has been to the ED multiple times for either DKA or gastroparesis with the last ED visit 05/05/2022   This patient with type 1 diabetes is treated with Tandem (insulin pump). During the visit the pump basal and bolus doses were reviewed including carb/insulin rations and supplemental doses. The clinical list was updated. The glucose meter download was reviewed in detail to determine if the current pump settings are providing the best glycemic control without excessive hypoglycemia.  Pump and meter download:    Pump   Tandem  Settings   Insulin type   Novolin-R    Basal  rate       0000 0.5 u/h           I:C ratio       0000-0600  1:12          Sensitivity       0000  50      Goal       0000  70-110             Type & Model of Pump: Tandem Insulin Type: Currently using Novolin-R.  There is no height or weight on file to calculate BMI.  PUMP STATISTICS: Average BG: 233  BG Readings: 225  / day Average Daily Carbs (g): 33  Average Total Daily Insulin: 30.19 Average Daily Basal: 17 (58 %) Average Daily Bolus: 12 (42 %)    CONTINUOUS GLUCOSE MONITORING RECORD INTERPRETATION    Dates of Recording: 2/17-04/18/2021  Sensor description: dexcom  Results statistics:   CGM use % of time 93  Average and SD 218/77  Time in range    40 %  % Time Above 180 25  % Time above 250 34  % Time Below target <      Glycemic patterns summary: BG's optimal at night, hyperglycemia noted during the day   Hyperglycemic episodes  postprandial  Hypoglycemic episodes occurred n/a  Overnight periods: at goal      HOME DIABETES REGIMEN: Novolin-R    Statin: no ACE-I/ARB: yes Prior Diabetic Education: yes    CONTINUOUS GLUCOSE MONITORING  RECORD INTERPRETATION    Dates of Recording: 10/19-11/02/2020  Sensor description:dexcom  Results statistics:   CGM use % of time 29  Average and SD 281/81  Time in range      14  %  % Time Above 180 20  % Time above 250 66  % Time Below target 0    Glycemic patterns summary: Hyperglycemia during the day and night   Hyperglycemic episodes  all day and night   Hypoglycemic episodes occurred following a bolus   Overnight periods: high        DIABETIC COMPLICATIONS: Microvascular complications:  Gastroparesis , neuropathy , has DR ( Received injections B/L).  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 pylori gastric  ulcer    Coffee ground emesis    Diabetes mellitus ([place])    Diabetic gastroparesis ([place])    DKA (diabetic ketoacidosis) ([place]) 02/24/2021   Gastroparesis    GERD (gastroesophageal reflux disease)    Hypertension    Hyperthyroidism    Intractable nausea and vomiting 04/20/2021   Normocytic anemia 04/20/2020   Prolonged Q-T interval on ECG    Past Surgical History:  Past Surgical History:  Procedure Laterality Date   AMPUTATION TOE Left 03/10/2018   Procedure: AMPUTATION FIFTH TOE;  Surgeon: Trula Slade, DPM;  Location: [facility];  Service: Podiatry;  Laterality: Left;   BIOPSY  01/28/2020   Procedure: BIOPSY;  Surgeon: Otis Brace, MD;  Location: [facility];  Service: Gastroenterology;;   BOTOX INJECTION  08/26/2021   Procedure: BOTOX INJECTION;  Surgeon: Doran Stabler, MD;  Location: [facility];  Service: Gastroenterology;;   ESOPHAGOGASTRODUODENOSCOPY N/A 01/28/2020   Procedure: ESOPHAGOGASTRODUODENOSCOPY (EGD);  Surgeon: Otis Brace, MD;  Location: [facility];  Service: Gastroenterology;  Laterality: N/A;   ESOPHAGOGASTRODUODENOSCOPY (EGD) WITH PROPOFOL Left 09/08/2015   Procedure: ESOPHAGOGASTRODUODENOSCOPY (EGD) WITH PROPOFOL;  Surgeon: Arta Silence, MD;  Location[facility];  Service: Endoscopy;  Laterality: Left;   ESOPHAGOGASTRODUODENOSCOPY (EGD) WITH PROPOFOL N/A 08/26/2021   Procedure: ESOPHAGOGASTRODUODENOSCOPY (EGD) WITH PROPOFOL;  Surgeon: Doran Stabler, MD;  Location: [facility];  Service: Gastroenterology;  Laterality: N/A;   WISDOM TOOTH EXTRACTION      Social History:  reports that she has never smoked. She has never used smokeless tobacco. She reports current alcohol use of about 1.0 standard drink of alcohol per week. She reports that she does not use drugs. Family History:  Family History  Problem Relation Age of Onset   Lung cancer Mother    Diabetes Mother    Bipolar disorder Father    Liver cancer Maternal  Grandfather    Diabetes Maternal Aunt        x 2   Pancreatic cancer Maternal Uncle    Prostate cancer Maternal Uncle    Breast cancer Other        maternal great aunt   Heart disease Other    Ovarian cancer Other        maternal great aunt   Kidney disease Other        maternal great aunt   Colon cancer Neg Hx    Stomach cancer Neg Hx      HOME MEDICATIONS: Allergies as of 05/13/2022       Reactions   Lisinopril Cough   Breakout in rash  Medication List        Accurate as of May 12, 2022 10:48 AM. If you have any questions, ask your nurse or doctor.          atorvastatin 20 MG tablet Commonly known as: LIPITOR Take 1 tablet (20 mg total) by mouth daily.   Dexcom G6 Transmitter Misc Change every 90 days   gabapentin 300 MG capsule Commonly known as: NEURONTIN Take 1 capsule (300 mg total) by mouth 3 (three) times daily.   HumuLIN R 100 units/mL injection Generic drug: insulin regular Inject 0 - 15 units into the skin 3 times a day with meals, per sliding scale   hydrOXYzine 25 MG capsule Commonly known as: VISTARIL Take 1 capsule (25 mg total) by mouth every 8 (eight) hours as needed. What changed: reasons to take this   Levemir FlexTouch 100 UNIT/ML FlexPen Generic drug: insulin detemir Inject 16 Units into the skin 2 times daily.   losartan 25 MG tablet Commonly known as: COZAAR Take 1 tablet (25 mg total) by mouth daily.   metoCLOPramide 10 MG tablet Commonly known as: REGLAN Take 1 tablet (10 mg) by mouth every 8 hours as needed for nausea.   ondansetron 4 MG tablet Commonly known as: ZOFRAN Take 1 tablet (4 mg total) by mouth every 8 (eight) hours as needed for nausea or vomiting.   pantoprazole 40 MG tablet Commonly known as: PROTONIX Take 1 tablet (40 mg total) by mouth 2 (two) times daily before a meal.   promethazine 25 MG suppository Commonly known as: PHENERGAN Place 1 suppository (25 mg total) rectally every 6 (six)  hours as needed for nausea or vomiting.         ALLERGIES: Allergies  Allergen Reactions   Lisinopril Cough    Breakout in rash     REVIEW OF SYSTEMS: A comprehensive ROS was conducted with the patient and is negative except as per HPI     OBJECTIVE:   VITAL SIGNS: There were no vitals taken for this visit.   PHYSICAL EXAM:  General: Pt appears well and is in NAD  Neck: General: Supple without adenopathy or carotid bruits. Thyroid: Thyroid size normal.  Right nodule appreciated   Lungs: Clear with good BS bilat with no rales, rhonchi, or wheezes  Heart: RRR   Abdomen: soft, nontender, without masses or organomegaly palpable  Extremities:  Lower extremities - No pretibial edema.   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 Left dorsal foot skin ulceration noted  The pedal pulses are 2+ on right and 2+ on left. The sensation is decreased  to a screening 5.07, 10 gram monofilament bilaterally   DATA REVIEWED:  Lab Results  Component Value Date   HGBA1C 11.2 (H) 03/23/2022   HGBA1C 10.3 (H) 10/06/2021   HGBA1C 10.9 (H) 09/21/2021    Latest Reference Range & Units 05/09/22 06:12  Sodium 135 - 145 mmol/L 134 (L)  Potassium 3.5 - 5.1 mmol/L 4.5  Chloride 98 - 111 mmol/L 108  CO2 22 - 32 mmol/L 19 (L)  Glucose 70 - 99 mg/dL 166 (H)  BUN 6 - 20 mg/dL 26 (H)  Creatinine 0.44 - 1.00 mg/dL 1.19 (H)  Calcium 8.9 - 10.3 mg/dL 8.9  Anion gap 5 - 15  7  GFR, Estimated >60 mL/min >60      Thyroid  ultrasound 05/15/2021  Estimated total number of nodules >/= 1 cm: 2   Number of spongiform nodules >/=  2 cm not described below (TR1): 0   Number of mixed cystic and solid nodules >/= 1.5 cm not described below ([place]): 0   _________________________________________________________   Nodule # 1:   Prior biopsy: No   Location: Right; mid    Maximum size: 1.3 cm; Other 2 dimensions: 0.9 x 0.7 cm, previously, 1.3 x 0.9 x 0.8 cm   Composition: solid/almost completely solid (2)   Echogenicity: isoechoic (1)   Shape: taller-than-wide (3)   Margins: ill-defined (0)   Echogenic foci: none (0)   ACR TI-RADS total points: 6.   ACR TI-RADS risk category:  TR4 (4-6 points).   Significant change in size (>/= 20% in two dimensions and minimal increase of 2 mm): No   Change in features: No   Change in ACR TI-RADS risk category: No   ACR TI-RADS recommendations:   *Given size (>/= 1 - 1.4 cm) and appearance, a follow-up ultrasound in 1 year should be considered based on TI-RADS criteria.   _________________________________________________________   Nodule # 2:   Prior biopsy: No   Location: Right; inferior   Maximum size: 1.3 cm; Other 2 dimensions: 1.2 x 1.1 cm, previously, 1.5 x 1.3 x 1.2 cm   Composition: solid/almost completely solid (2)   Echogenicity: hypoechoic (2)   Shape: not taller-than-wide (0)   Margins: smooth (0)   Echogenic foci: none (0)   ACR TI-RADS total points: 4.   ACR TI-RADS risk category:  TR4 (4-6 points).   Significant change in size (>/= 20% in two dimensions and minimal increase of 2 mm): No   Change in features: No   Change in ACR TI-RADS risk category: No   ACR TI-RADS recommendations:   *Given size (>/= 1 - 1.4 cm) and appearance, a follow-up ultrasound in 1 year should be considered based on TI-RADS criteria.   _________________________________________________________   Mildly enlarged left neck lymph node is seen.   IMPRESSION: 1. Nodule 1 (TI-RADS 4), measuring 1.3 cm, located in the mid right thyroid lobe, is unchanged in size since 03/15/2020. Annual ultrasound surveillance is recommended until 5 years of stability is documented. 2. Nodule 2 (TI-RADS 4), measuring 1.3 cm, located in the inferior right thyroid lobe, is unchanged in size since 03/15/2020.  Annual ultrasound surveillance is recommended until 5 years of stability is documented. 3. Mildly enlarged left submandibular neck lymph node measuring up to 10 mm in short axis. Etiology of this lymph node is uncertain but most likely relates to inflammation. A follow-up ultrasound should be performed in 8-12 weeks to again evaluate this lymph node. Ultrasound should be performed earlier if there is clinical evidence of growth.    ABI 03/13/2021 Right: Resting right ankle-brachial index is within normal range. No  evidence of significant right lower extremity arterial disease. The right  toe-brachial index is normal.   Left: Resting left ankle-brachial index is within normal range. No  evidence of significant left lower extremity arterial disease. The left  toe-brachial index is normal.    ASSESSMENT / PLAN / RECOMMENDATIONS:   1) Type 1 Diabetes Mellitus, Poorly controlled, With retinopathic, neuropathic complications and gastroparesis  - Most recent A1c of 9.9 %. Goal A1c < 7.0 %.    -She has not been on the insulin pump long enough but in review of her pump and CGM download her BG's seem to be improving.  The  only issue I see is that she does not always bolus when she eats.   -Today we emphasized importance of carb counting and entering this information into the pump that way she receives appropriate dose of prandial insulin -Due to gastroparesis she takes insulin post-prandial, it will be difficult to reach optimal glycemic control  -No changes will be made to her pump settings today    MEDICATIONS:  - Novolin - R   Pump   Tandem  Settings   Insulin type   Novolin-R    Basal rate       0000 0.5 u/h           I:C ratio       0000-0600  1:12          Sensitivity       0000  50      Goal       0000  70-110         EDUCATION / INSTRUCTIONS: BG monitoring instructions: Patient is instructed to check her blood sugars 3 times a day, before meals . Call [facility] clinic if: BG persistently < 70  I reviewed the Rule of 15 for the treatment of hypoglycemia in detail with the patient. Literature supplied.   2) Diabetic complications:  Eye: Does  have known diabetic retinopathy.  Neuro/ Feet: Does  have known diabetic peripheral neuropathy. Renal: Patient does not have known baseline CKD. She is  on an ACEI/ARB at present   3) Multinodular Goiter :  -Previous ultrasound from 02/2020 shows multinodular goiter and 2 of these nodules meet FNA criteria. -Repeat thyroid ultrasound 04/2021 continue to show multinodular goiter but none of the nodules met FNA criteria     F/U in 4 months    Signed electronically by: Mack Guise, MD  [facility]  [facility] [address] [place], [place] [address] Phone: [phone] FAX: [phone]   CC: Pcp, No No address on file Phone: None  Fax: None    Return to Endocrinology clinic as below: Future Appointments  Date Time Provider [place]  05/13/2022 10:10 AM Ja Ohman, Melanie Crazier, MD [id number] None

## 2022-05-13 ENCOUNTER — Other Ambulatory Visit (HOSPITAL_COMMUNITY): Payer: Self-pay

## 2022-05-13 ENCOUNTER — Ambulatory Visit: Payer: Medicaid Other | Admitting: Internal Medicine

## 2022-05-22 ENCOUNTER — Other Ambulatory Visit (HOSPITAL_COMMUNITY): Payer: Self-pay

## 2022-06-12 ENCOUNTER — Ambulatory Visit: Payer: Medicaid Other | Admitting: Internal Medicine

## 2022-06-13 NOTE — Progress Notes (Unsigned)
Last pap - 05/08/21- negative

## 2022-06-16 ENCOUNTER — Encounter: Payer: Self-pay | Admitting: Obstetrics and Gynecology

## 2022-06-16 ENCOUNTER — Ambulatory Visit: Payer: Medicaid Other | Admitting: Obstetrics and Gynecology

## 2022-06-16 VITALS — BP 83/54 | HR 98 | Ht 67.0 in | Wt 185.0 lb

## 2022-06-16 DIAGNOSIS — Z01419 Encounter for gynecological examination (general) (routine) without abnormal findings: Secondary | ICD-10-CM

## 2022-06-17 NOTE — Progress Notes (Signed)
ANNUAL EXAM Patient name: Kelli Hudson MRN [id number]  Date of birth: 11/04/1990 Chief Complaint:   Annual Exam  History of Present Illness:   Kelli Hudson is a 32 y.o. G0P0000 with No LMP recorded. Patient has had an implant. being seen today for a routine annual exam.  Current complaints:   Interested in removing her nexplanon and trying to conceive.    Upstream - 06/17/22 2043       Pregnancy Intention Screening   Does the patient want to become pregnant in the next year? Unsure    Does the patient's partner want to become pregnant in the next year? Unsure    Would the patient like to discuss contraceptive options today? Yes      Contraception Wrap Up   Current Method Hormonal Implant    End Method Hormonal Implant    Contraception Counseling Provided Yes    How was the end contraceptive method provided? N/A            The pregnancy intention screening data noted above was reviewed. Potential methods of contraception were discussed. The patient elected to proceed with Hormonal Implant.   Last pap 05/09/20. Results were: NILM. H/O abnormal pap: no Last mammogram: n/a Family h/o breast cancer: no Last colonoscopy: n/a.Family h/o colorectal cancer: no HPV vaccine: 2011     05/29/2021    9:36 AM 04/10/2021    2:02 PM 04/10/2021    1:15 PM 11/09/2020    4:50 PM 06/27/2020    1:27 PM  Depression screen PHQ 2/9  Decreased Interest 0 2 2 0 0  Down, Depressed, Hopeless 0 0 0 0 0  PHQ - 2 Score 0 2 2 0 0  Altered sleeping  1  1 2   Tired, decreased energy  1  1 0  Change in appetite  1  0 0  Feeling bad or failure about yourself   0  0 0  Trouble concentrating  0  0 0  Moving slowly or fidgety/restless  0  0 0  Suicidal thoughts  0  0 0  PHQ-9 Score  5  2 2   Difficult doing work/chores  Not difficult at all  Not difficult at all Somewhat difficult        04/10/2021    2:03 PM 11/09/2020    4:50 PM 06/27/2020    1:27 PM 07/05/2019    1:37 PM  GAD 7 :  Generalized Anxiety Score  Nervous, Anxious, on Edge 0 1 0 1  Control/stop worrying 0 1 0 3  Worry too much - different things 0 2 0 3  Trouble relaxing 0 1 0 2  Restless 0 0 0 2  Easily annoyed or irritable 0 0 0 1  Afraid - awful might happen 0 0 0 1  Total GAD 7 Score 0 5 0 13  Anxiety Difficulty Not difficult at all Somewhat difficult Not difficult at all Somewhat difficult     Review of Systems:   Pertinent items are noted in HPI Denies any headaches, blurred vision, fatigue, shortness of breath, chest pain, abdominal pain, abnormal vaginal discharge/itching/odor/irritation, problems with periods, bowel movements, urination, or intercourse unless otherwise stated above. Pertinent History Reviewed:  Reviewed past medical,surgical, social and family history.  Reviewed problem list, medications and allergies. Physical Assessment:   Vitals:   06/16/22 1446  BP: (!) 83/54  Pulse: 98  Weight: 185 lb (83.9 kg)  Height: 5\' 7"  (1.702 m)  Body mass index is 28.98 kg/m.  Physical Examination:   General appearance - well appearing, and in no distress  Mental status - alert, oriented to person, place, and time  Chest - respiratory effort normal  Heart - normal peripheral perfusion  Breasts - breasts appear normal, no suspicious masses, no skin or nipple changes or axillary nodes  Abdomen - soft, nontender, nondistended, no masses or organomegaly  Pelvic - deferred after discussion with patient  Chaperone present for exam  No results found for this or any previous visit (from the past 24 hour(s)).  Assessment & Plan:  1) Well-Woman Exam Mammogram: @ 32yo, or sooner if problems Colonoscopy: @ 32yo, or sooner if problems Pap: Next due 04/2023 Gardasil: Needs 3rd dose GC/CT: Declined HIV/HCV: Declined  2) Preconception counseling Discussed risks of pregnancy in s/o T1DM with most recent A1c 11.2 and peripheral neuropathy, cHTN, and gastroparesis She has procedure planned  for 5/8 for insertion of a gastric stimulator Recommended waiting to conceive until A1c < 7%  Recommended PNV  Labs/procedures today:  No orders of the defined types were placed in this encounter.  Meds: No orders of the defined types were placed in this encounter.  Follow-up: Return for nexplanon removal and pre-pregnancy discussion.  Lennart Pall, MD 06/17/2022 8:44 PM

## 2022-06-18 ENCOUNTER — Ambulatory Visit: Payer: Medicaid Other | Admitting: Internal Medicine

## 2022-06-18 ENCOUNTER — Encounter: Payer: Self-pay | Admitting: Internal Medicine

## 2022-06-18 VITALS — BP 124/80 | HR 100 | Ht 67.0 in | Wt 184.0 lb

## 2022-06-18 DIAGNOSIS — E042 Nontoxic multinodular goiter: Secondary | ICD-10-CM | POA: Diagnosis not present

## 2022-06-18 DIAGNOSIS — E1065 Type 1 diabetes mellitus with hyperglycemia: Secondary | ICD-10-CM | POA: Diagnosis not present

## 2022-06-18 DIAGNOSIS — E1042 Type 1 diabetes mellitus with diabetic polyneuropathy: Secondary | ICD-10-CM

## 2022-06-18 HISTORY — PX: GASTRIC STIMULATOR IMPLANT SURGERY: SHX654

## 2022-06-18 LAB — POCT GLYCOSYLATED HEMOGLOBIN (HGB A1C): Hemoglobin A1C: 8.8 % — AB (ref 4.0–5.6)

## 2022-06-18 LAB — POCT GLUCOSE (DEVICE FOR HOME USE): Glucose Fasting, POC: 249 mg/dL — AB (ref 70–99)

## 2022-06-18 MED ORDER — DEXCOM G6 SENSOR MISC: 1.0000 | 2 refills | Status: DC

## 2022-06-18 MED ORDER — INSULIN PEN NEEDLE 32G X 4 MM MISC: 1.0000 | Freq: Four times a day (QID) | 2 refills | Status: AC

## 2022-06-18 MED ORDER — LEVEMIR FLEXTOUCH 100 UNIT/ML ~~LOC~~ SOPN: 16.0000 [IU] | PEN_INJECTOR | Freq: Two times a day (BID) | SUBCUTANEOUS | 2 refills | Status: DC

## 2022-06-18 MED ORDER — NOVOLIN R FLEXPEN 100 UNIT/ML IJ SOPN: PEN_INJECTOR | INTRAMUSCULAR | 2 refills | Status: DC

## 2022-06-18 MED ORDER — DEXCOM G6 TRANSMITTER MISC: 1.0000 | 2 refills | Status: AC

## 2022-06-18 NOTE — Progress Notes (Unsigned)
Name: Kelli Hudson  MRN/ DOB: [id number], April 19, 1990   Age/ Sex: 32 y.o., female    PCP: Pcp, No   Reason for Endocrinology Evaluation: Type 1 Diabetes Mellitus     Date of Initial Endocrinology Visit: 12/18/2020    PATIENT IDENTIFIER: Kelli Hudson is a 32 y.o. female with a past medical history of T1DM, and gastroparesis. The patient presented for initial endocrinology clinic visit on 12/18/2020 for consultative assistance with her diabetes management.     HPI: Kelli Hudson was   Diagnosed with DM 2008 ( age 75) .Hemoglobin A1c has ranged from 9.7% in 2017, peaking at 13.9% in 2021.  Started insulin pump 01/2021  She was lost to follow-up from 04/2021 until her return over a year later 06/2022, by then she was of OmniPod and did not want to go back on it   THYROID HISTORY: Patient has been noted with thyromegaly during physical exam, which prompted a thyroid ultrasound on 03/15/2020 revealing multinodular goiter.  Two of the nodules met criteria for FNA.  Patient was lost to follow-up.  Repeat FNA has been ordered 03/2021, GSR required repeat ultrasound first as it has been over a year since the prior ultrasound, repeat ultrasound 04/2021 continue to show multinodular goiter but none met FNA criteria at the time  SUBJECTIVE:   During the last visit (12/18/2020): A1c 9.8% adjusted MDI regimen   Today (06/18/22): Kelli Hudson is here for a follow up on diabetes management. She has NOT been to our clinic in 14 months. She has been without the pump or CGM for a while.   She has been checking glucose 3-4 x daily .  Over the past year the patient has had multiple ED visits and hospital admissions for gastroparesis.  She is scheduled for insertion of gastric stimulator through [facility] 5/8th   She she would like her Nexplanon , but she was counseled by GYN about the importance of improving A1c to 7% prior to removing Nexplanon and considering  pregnancy She had a DKA 03/04/2022  She continues with nausea and vomiting but no diarrhea  Denies local neck swelling  Denies palpitations  Denies tremors   HOME DIABETES REGIMEN: Novolin-R 1- 3 units TIDQAC Levemir 8 units BID      Statin: no ACE-I/ARB: yes Prior Diabetic Education: yes  CGM: n/a     DIABETIC COMPLICATIONS: Microvascular complications:  Gastroparesis , neuropathy , has DR ( Received injections B/L).  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 pylori gastric ulcer    Coffee ground emesis    Diabetes mellitus (HCC)    Diabetic gastroparesis (HCC)    DKA (diabetic ketoacidosis) (HCC) 02/24/2021   Gastroparesis    GERD (gastroesophageal reflux disease)    Hypertension    Hyperthyroidism    Intractable nausea and vomiting 04/20/2021   Normocytic anemia 04/20/2020   Prolonged Q-T interval on ECG    Past Surgical History:  Past Surgical History:  Procedure Laterality Date   AMPUTATION TOE Left 03/10/2018   Procedure: AMPUTATION FIFTH TOE;  Surgeon: Vivi Barrack, DPM;  Location: [facility];  Service: Podiatry;  Laterality: Left;   BIOPSY  01/28/2020   Procedure: BIOPSY;  Surgeon: Kathi Der, MD;  Location: [facility];  Service: Gastroenterology;;   BOTOX INJECTION  08/26/2021   Procedure: BOTOX  INJECTION;  Surgeon: Sherrilyn Rist, MD;  Location: [facility];  Service: Gastroenterology;;   ESOPHAGOGASTRODUODENOSCOPY N/A 01/28/2020   Procedure: ESOPHAGOGASTRODUODENOSCOPY (EGD);  Surgeon: Kathi Der, MD;  Location: [facility];  Service: Gastroenterology;  Laterality: N/A;   ESOPHAGOGASTRODUODENOSCOPY (EGD) WITH PROPOFOL Left 09/08/2015   Procedure: ESOPHAGOGASTRODUODENOSCOPY (EGD) WITH PROPOFOL;  Surgeon: Willis Modena, MD;  Location: [facility];   Service: Endoscopy;  Laterality: Left;   ESOPHAGOGASTRODUODENOSCOPY (EGD) WITH PROPOFOL N/A 08/26/2021   Procedure: ESOPHAGOGASTRODUODENOSCOPY (EGD) WITH PROPOFOL;  Surgeon: Sherrilyn Rist, MD;  Location: [facility];  Service: Gastroenterology;  Laterality: N/A;   WISDOM TOOTH EXTRACTION      Social History:  reports that she has never smoked. She has never used smokeless tobacco. She reports current alcohol use of about 1.0 standard drink of alcohol per week. She reports that she does not use drugs. Family History:  Family History  Problem Relation Age of Onset   Lung cancer Mother    Diabetes Mother    Bipolar disorder Father    Liver cancer Maternal Grandfather    Diabetes Maternal Aunt        x 2   Pancreatic cancer Maternal Uncle    Prostate cancer Maternal Uncle    Breast cancer Other        maternal great aunt   Heart disease Other    Ovarian cancer Other        maternal great aunt   Kidney disease Other        maternal great aunt   Colon cancer Neg Hx    Stomach cancer Neg Hx      HOME MEDICATIONS: Allergies as of 06/18/2022       Reactions   Lisinopril Cough, Other (See Comments), Itching, Rash   Breakout in rash        Medication List        Accurate as of Jun 18, 2022  7:10 AM. If you have any questions, ask your nurse or doctor.          atorvastatin 20 MG tablet Commonly known as: LIPITOR Take 1 tablet (20 mg total) by mouth daily.   Dexcom G6 Transmitter Misc Change every 90 days   gabapentin 300 MG capsule Commonly known as: NEURONTIN Take 1 capsule (300 mg total) by mouth 3 (three) times daily.   HumuLIN R 100 units/mL injection Generic drug: insulin regular Inject 0 - 15 units into the skin 3 times a day with meals, per sliding scale   hydrOXYzine 25 MG capsule Commonly known as: VISTARIL Take 1 capsule (25 mg total) by mouth every 8 (eight) hours as needed. What changed: reasons to take this   Levemir FlexTouch 100 UNIT/ML  FlexPen Generic drug: insulin detemir Inject 16 Units into the skin 2 times daily.   losartan 25 MG tablet Commonly known as: COZAAR Take 1 tablet (25 mg total) by mouth daily.   metoCLOPramide 10 MG tablet Commonly known as: REGLAN Take 1 tablet (10 mg) by mouth every 8 hours as needed for nausea.   Nexplanon 68 MG Impl implant Generic drug: etonogestrel 1 each by Subdermal route once.   ondansetron 4 MG tablet Commonly known as: ZOFRAN Take 1 tablet (4 mg total) by mouth every 8 (eight) hours as needed for nausea or vomiting.   pantoprazole 40 MG tablet Commonly known as: PROTONIX Take 1 tablet (40 mg total) by mouth 2 (two) times daily before a meal.   promethazine 25 MG suppository Commonly  known as: PHENERGAN Place 1 suppository (25 mg total) rectally every 6 (six) hours as needed for nausea or vomiting.         ALLERGIES: Allergies  Allergen Reactions   Lisinopril Cough, Other (See Comments), Itching and Rash    Breakout in rash     REVIEW OF SYSTEMS: A comprehensive ROS was conducted with the patient and is negative except as per HPI     OBJECTIVE:   VITAL SIGNS: BP 124/80 (BP Location: Left Arm, Patient Position: Sitting, Cuff Size: Small)   Pulse 100   Ht 5\' 7"  (1.702 m)   Wt 184 lb (83.5 kg)   SpO2 96%   BMI 28.82 kg/m    PHYSICAL EXAM:  General: Pt appears well and is in NAD  Neck: General: Supple without adenopathy or carotid bruits. Thyroid: Thyroid size normal.  Right nodule appreciated   Lungs: Clear with good BS bilat   Heart: RRR   Abdomen:  soft, nontender  Extremities:  Lower extremities - No pretibial edema.   Neuro: MS is good with appropriate affect, pt is alert and Ox3    DM Foot exam 06/18/2022 The skin of the feet is without sores or ulcerations. The pedal pulses are 2+ on right and 2+ on left. The sensation is absent to a screening 5.07, 10 gram monofilament bilaterally   DATA REVIEWED:  Lab Results  Component Value  Date   HGBA1C 11.2 (H) 03/23/2022   HGBA1C 10.3 (H) 10/06/2021   HGBA1C 10.9 (H) 09/21/2021     Latest Reference Range & Units 05/09/22 06:12  Sodium 135 - 145 mmol/L 134 (L)  Potassium 3.5 - 5.1 mmol/L 4.5  Chloride 98 - 111 mmol/L 108  CO2 22 - 32 mmol/L 19 (L)  Glucose 70 - 99 mg/dL 161 (H)  BUN 6 - 20 mg/dL 26 (H)  Creatinine 0.96 - 1.00 mg/dL 0.45 (H)  Calcium 8.9 - 10.3 mg/dL 8.9  Anion gap 5 - 15  7  GFR, Estimated >60 mL/min >60  (L): Data is abnormally low (H): Data is abnormally high       Thyroid ultrasound 05/15/2021  Nodule # 1:   Prior biopsy: No   Location: Right; mid   Maximum size: 1.3 cm; Other 2 dimensions: 0.9 x 0.7 cm, previously, 1.3 x 0.9 x 0.8 cm   Composition: solid/almost completely solid (2)   Echogenicity: isoechoic (1)   Shape: taller-than-wide (3)   Margins: ill-defined (0)   Echogenic foci: none (0)   ACR TI-RADS total points: 6.   ACR TI-RADS risk category:  TR4 (4-6 points).   Significant change in size (>/= 20% in two dimensions and minimal increase of 2 mm): No   Change in features: No   Change in ACR TI-RADS risk category: No   ACR TI-RADS recommendations:   *Given size (>/= 1 - 1.4 cm) and appearance, a follow-up ultrasound in 1 year should be considered based on TI-RADS criteria.   _________________________________________________________   Nodule # 2:   Prior biopsy: No   Location: Right; inferior   Maximum size: 1.3 cm; Other 2 dimensions: 1.2 x 1.1 cm, previously, 1.5 x 1.3 x 1.2 cm   Composition: solid/almost completely solid (2)   Echogenicity: hypoechoic (2)   Shape: not taller-than-wide (0)   Margins: smooth (0)   Echogenic foci: none (0)   ACR TI-RADS total points: 4.   ACR TI-RADS risk category:  TR4 (4-6 points).   Significant change in size (>/= 20%  in two dimensions and minimal increase of 2 mm): No   Change in features: No   Change in ACR TI-RADS risk category: No   ACR TI-RADS  recommendations:   *Given size (>/= 1 - 1.4 cm) and appearance, a follow-up ultrasound in 1 year should be considered based on TI-RADS criteria.   _________________________________________________________   Mildly enlarged left neck lymph node is seen.   IMPRESSION: 1. Nodule 1 (TI-RADS 4), measuring 1.3 cm, located in the mid right thyroid lobe, is unchanged in size since 03/15/2020. Annual ultrasound surveillance is recommended until 5 years of stability is documented. 2. Nodule 2 (TI-RADS 4), measuring 1.3 cm, located in the inferior right thyroid lobe, is unchanged in size since 03/15/2020. Annual ultrasound surveillance is recommended until 5 years of stability is documented. 3. Mildly enlarged left submandibular neck lymph node measuring up to 10 mm in short axis. Etiology of this lymph node is uncertain but most likely relates to inflammation. A follow-up ultrasound should be performed in 8-12 weeks to again evaluate this lymph node. Ultrasound should be performed earlier if there is clinical evidence of growth.     ABI 03/13/2021 Right: Resting right ankle-brachial index is within normal range. No  evidence of significant right lower extremity arterial disease. The right  toe-brachial index is normal.   Left: Resting left ankle-brachial index is within normal range. No  evidence of significant left lower extremity arterial disease. The left  toe-brachial index is normal.     In office BG 249 mg/dL  ASSESSMENT / PLAN / RECOMMENDATIONS:   1) Type 1 Diabetes Mellitus, Poorly controlled, With retinopathic, neuropathic complications and gastroparesis  - Most recent A1c of 8.8 %. Goal A1c < 7.0 %.    -She has not been to our clinic in 14 months. She has been on MDI regimen  -  Pt would like to remain on MDI regimen, does not want the pump again  - She needs a refill on Dexcom G6, does not want to upgrade at this time  -Pre-conception counseling done, with A1c of 6.0  -6.5% goal during pregnancy with fasting goal 60-90 mg/dL   -She has been eating carbohydrates 1 meal a day, the other 2 meals she eliminates carbohydrate is much as possible -She will be given a standing dose of regular insulin with those meals and a new correction scale will be provided -She did not want to change her Levemir as she just received a new prescription, will continue but increase the dose as below   MEDICATIONS: Increase Levemir 10 units BID Novolin - R 1 unit with a low-carb meal, 4 units with a high carb meal CF: Novolin (BG-130/40)   EDUCATION / INSTRUCTIONS: BG monitoring instructions: Patient is instructed to check her blood sugars 3 times a day, before meals . Call [facility] if: BG persistently < 70  I reviewed the Rule of 15 for the treatment of hypoglycemia in detail with the patient. Literature supplied.   2) Diabetic complications:  Eye: Does  have known diabetic retinopathy.  Neuro/ Feet: Does  have known diabetic peripheral neuropathy. Renal: Patient does not have known baseline CKD. She is  on an ACEI/ARB at present   3) Multinodular Goiter :  -Previous ultrasound from 02/2020 shows multinodular goiter and 2 of these nodules meet FNA criteria. -Repeat ultrasound in a year later continue to show multinodular goiter but none met FNA criteria -Will proceed with repeat thyroid ultrasound at this time  F/U in 3 months    Signed electronically by: Kelli Herrlich, MD  [facility]  [facility] [address] [place], [place] [address] Phone: [phone] FAX: [phone]   CC: Pcp, No No address on file Phone: None  Fax: None    Return to Endocrinology clinic as below: Future Appointments  Date Time Provider Department Center  06/18/2022  9:10 AM Kelli Hudson, Konrad Dolores, MD [facility] None  07/31/2022  1:10 PM Lennart Pall, MD [facility]  08/13/2022  9:20 AM  Armbruster, Willaim Rayas, MD [facility]

## 2022-06-18 NOTE — Patient Instructions (Addendum)
-   Levemir 10 units twice daily  - Novolin - R 1 units for a low carb meal and 4 units for a high carb meal -Novolin-R correctional insulin: ADD extra units on insulin to your meal-time Novolin dose if your blood sugars are higher than 170. Use the scale below to help guide you:   Blood sugar before meal Number of units to inject  Less than 170 0 unit  170- 210 1 units  211 -  250 2 units  251 -  290 3 units  291 - 330 4 units  331 - 370 5 units  371 - 410 6 units  411 - 450 7 units  451 - 490 8 units    HOW TO TREAT LOW BLOOD SUGARS (Blood sugar LESS THAN 70 MG/DL) Please follow the RULE OF 15 for the treatment of hypoglycemia treatment (when your (blood sugars are less than 70 mg/dL)   STEP 1: Take 15 grams of carbohydrates when your blood sugar is low, which includes:  3-4 GLUCOSE TABS  OR 3-4 OZ OF JUICE OR REGULAR SODA OR ONE TUBE OF GLUCOSE GEL    STEP 2: RECHECK blood sugar in 15 MINUTES STEP 3: If your blood sugar is still low at the 15 minute recheck --> then, go back to STEP 1 and treat AGAIN with another 15 grams of carbohydrates.

## 2022-07-15 ENCOUNTER — Other Ambulatory Visit: Payer: Medicaid Other

## 2022-07-16 ENCOUNTER — Telehealth: Payer: Self-pay

## 2022-07-16 ENCOUNTER — Other Ambulatory Visit (HOSPITAL_COMMUNITY): Payer: Self-pay

## 2022-07-16 NOTE — Telephone Encounter (Signed)
Patient Advocate Encounter   Received notification from [facility] that prior authorization is required for Dexcom G6 sensor  Submitted: 07/16/22 Key [id number]  PA was approved   Effective through 01/11/23

## 2022-07-31 ENCOUNTER — Ambulatory Visit: Payer: Medicaid Other | Admitting: Obstetrics and Gynecology

## 2022-08-06 ENCOUNTER — Other Ambulatory Visit: Payer: Medicaid Other

## 2022-08-13 ENCOUNTER — Ambulatory Visit: Payer: Medicaid Other | Admitting: Gastroenterology

## 2022-08-13 NOTE — Progress Notes (Deleted)
HPI : last seen Sept 1646:  32 year old female here for follow-up visit.  Accompanied by her mother today and additional family members.  I last saw her in May of this year.  Recall she has severe gastroparesis in the setting of poorly controlled diabetes.  She has had numerous ED visits and hospitalizations over the years, particularly this year she has been in the hospital many times.  She was admitted both in July and August most recently.   Recall she has chronic early satiety, with reflux.  She has episodes of gastroparesis that bothers her so much where she feels very nauseated, vomiting that leads to dry heaving, dehydration and admission to the hospital for fluids and antiemetics.  Recall she is on an insulin pump, her A1c has been above 10, longstanding diabetes.  She has had gastric emptying study March 2022 showing severe gastroparesis.  She has had a few EGDs now without concerning pathology.  Most recently had an EGD in July while hospitalized.  Had empiric Botox injection at that time which she states has not really helped.  She does not use any NSAIDs.   Complicating her course has been prolonged QTc interval while in the hospital in the setting of using Reglan, she is also been on a variety of antiemetics with Zofran, Phenergan, Compazine.  I had previously tried to get her domperidone from [place], she could not afford that, and we have held off on using it since the QTc has been prolonged.  She has not been able to used erythromycin due to the prolonged QTc.  She has not had a trial of Motegrity and we had discussed that, unclear if covered by her insurance.  I had placed a referral for her to see [facility], [facility], and [facility] for consideration for gastric pacemaker or pyloromyotomy, she was declined by all of the centers.  Unfortunately she is really been struggling with this issue lately.  Most recently admitted a few weeks ago.  I had previously referred her to acupuncture but she had not  made that appointment.   Since I have last seen her she has made some significant changes to her diet.  She is eating a lot of soups and broths and liquid based diet which she does tolerate much better.  She has been trying to limit the amount of meat and heavy food she is having.  She is trying to eat a lot of fruit, limiting her vegetables.  She states she does not feel as full easily and for the past few weeks at least has been doing pretty well.  She has stopped routine use of Reglan given the prolonged QTc, last was 545 on 8/25. she been on Protonix 40 mg as needed, Compazine as needed.  Zofran and Phenergan have been held.   She reports her bowel function is normal.  No blood in her stools or diarrhea.  Recall she had CT scans in the hospital in July which suggested some questionable colitis.  She had no diarrhea at that time.  We have not thought she would tolerate a bowel prep to perform a colonoscopy for that.       EGD 08/26/21: The larynx was normal. The esophagus was normal. The entire examined stomach was visibly normal. Specifically, no ulcer, mass, inflammation or retained contents other than a small amount of bilious liquid that was easily cleared with suction. Diminished motility seen. The pylorus was patent and without ulceration. Several minutes of observation revealed intermittent  pyloric closure causing resistance to scope passage. The pylorus was successfully injected with 100 units botulinum toxin in a 4-quadrant fashion. The cardia and gastric fundus were normal on retroflexion. The examined duodenum was normal.   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 female here for assessment of the following   1. Gastroparesis   2. Prolonged Q-T interval on ECG     History as above.  Type 1 diabetes with elevated A1c, severe gastroparesis.  EGDs have looked okay.  CT scans have not shown another cause.  She does have a history of gallstones however she has no baseline biliary colic, I  do not think her gallbladder is driving these issues.  Very difficult situation.  Her QTc has been quite prolonged in the past, most recently in the hospital at 543.  Thus she has not been using Reglan, Zofran, Phenergan routinely.  She has been using Compazine as needed for nausea, and PPI at baseline.  She has not been able to afford domperidone nor can we use that with her prolonged QTc.  Likewise she is not a candidate for erythromycin with prolonged QTc.  I previously referred her to tertiary care centers [facility], [place], [place]), none of which would accept her case.  Unfortunately she continues to have episodic symptoms that bother her.  She does seem to be doing better with a mostly liquid diet recently, she should continue with this if she tolerates it better and is not losing weight.   We discussed options long-term.  Now that she is been out of the hospital few weeks I would like her to have another EKG to recheck her QTc.  If the QTc was normal I would consider domperidone, again cost is an issue with this for her.  I am going to reach out to [facility] or [facility] to see where they are sending these patients for consideration for gastric pacemaker or pyloromyotomy.  Patient is willing to travel out of state if needed.  We discussed options in the interim, I am going to try to get her some samples of Motegrity to see if that would help.  She does not have any baseline constipation and may have diarrhea from this but she is willing to try it.  I have referred her to acupuncture in the past and she did not pursue it, will place referral and she is willing to try it.  Otherwise, in order to keep her out of the hospital and reduce her flares of this, I think she may benefit from a venting gastrostomy tube, and perhaps jejunostomy for feeding.  I discussed this option with her and her parents, she is interested in pursuing this to keep her on the hospital, I will place referral to IR for this.  Otherwise  continue current regimen and I will see her back in a few months for reassessment, if not sooner.  If I can find a tertiary care center for her to see in the interim, I will refer her.  She agrees   PLAN: - EKG at PCP to recheck QTc - continue protonix and compazine PRN for now - liquid diet mostly, avoid heavy foods - samples of Motegrity ordered for her, will contact her once they arrive - referral to acupuncture  - referral to tertiary care facility for consideration for gastric pacemaker as above - refer to IR for venting gastrostomy / jejunostomy tube  - holding Zofran / phenergan / erythromycin / Reglan / domperidone  given prolonged QTc  - f/u in a few months     Referred to Dr. Ether Griffins outside of [place] for possible gastric pacemaker  Gastric pacemaker in May 6 admissions since then or ED visits?       Past Medical History:  Diagnosis Date   Acute H. pylori gastric ulcer    Coffee ground emesis    Diabetes mellitus (HCC)    Diabetic gastroparesis (HCC)    DKA (diabetic ketoacidosis) (HCC) 02/24/2021   Gastroparesis    GERD (gastroesophageal reflux disease)    Hypertension    Hyperthyroidism    Intractable nausea and vomiting 04/20/2021   Normocytic anemia 04/20/2020   Prolonged Q-T interval on ECG      Past Surgical History:  Procedure Laterality Date   AMPUTATION TOE Left 03/10/2018   Procedure: AMPUTATION FIFTH TOE;  Surgeon: Vivi Barrack, DPM;  Location: [facility];  Service: Podiatry;  Laterality: Left;   BIOPSY  01/28/2020   Procedure: BIOPSY;  Surgeon: Kathi Der, MD;  Location: [facility];  Service: Gastroenterology;;   BOTOX INJECTION  08/26/2021   Procedure: BOTOX INJECTION;  Surgeon: Sherrilyn Rist, MD;  Location: [facility];  Service: Gastroenterology;;   ESOPHAGOGASTRODUODENOSCOPY N/A 01/28/2020   Procedure: ESOPHAGOGASTRODUODENOSCOPY (EGD);  Surgeon: Kathi Der, MD;  Location: [facility];  Service:  Gastroenterology;  Laterality: N/A;   ESOPHAGOGASTRODUODENOSCOPY (EGD) WITH PROPOFOL Left 09/08/2015   Procedure: ESOPHAGOGASTRODUODENOSCOPY (EGD) WITH PROPOFOL;  Surgeon: Willis Modena, MD;  Location: [facility];  Service: Endoscopy;  Laterality: Left;   ESOPHAGOGASTRODUODENOSCOPY (EGD) WITH PROPOFOL N/A 08/26/2021   Procedure: ESOPHAGOGASTRODUODENOSCOPY (EGD) WITH PROPOFOL;  Surgeon: Sherrilyn Rist, MD;  Location: [facility];  Service: Gastroenterology;  Laterality: N/A;   WISDOM TOOTH EXTRACTION     Family History  Problem Relation Age of Onset   Lung cancer Mother    Diabetes Mother    Bipolar disorder Father    Liver cancer Maternal Grandfather    Diabetes Maternal Aunt        x 2   Pancreatic cancer Maternal Uncle    Prostate cancer Maternal Uncle    Breast cancer Other        maternal great aunt   Heart disease Other    Ovarian cancer Other        maternal great aunt   Kidney disease Other        maternal great aunt   Colon cancer Neg Hx    Stomach cancer Neg Hx    Social History   Tobacco Use   Smoking status: Never   Smokeless tobacco: Never  Vaping Use   Vaping Use: Never used  Substance Use Topics   Alcohol use: Yes    Alcohol/week: 1.0 standard drink of alcohol    Types: 1 Shots of liquor per week    Comment: occasional    Drug use: No   Current Outpatient Medications  Medication Sig Dispense Refill   atorvastatin (LIPITOR) 20 MG tablet Take 1 tablet (20 mg total) by mouth daily. 90 tablet 2   carvedilol (COREG) 12.5 MG tablet Take 12.5 mg by mouth 2 (two) times daily.     Continuous Blood Gluc Transmit (DEXCOM G6 TRANSMITTER) MISC Change every 90 days 1 each 3   Continuous Glucose Sensor (DEXCOM G6 SENSOR) MISC 1 Device by Does not apply route as directed. 9 each 2   Continuous Glucose Transmitter (DEXCOM G6 TRANSMITTER) MISC 1 Device by Does not apply route as directed. 1  each 2   etonogestrel (NEXPLANON) 68 MG IMPL implant 1 each by Subdermal  route once.     gabapentin (NEURONTIN) 300 MG capsule Take 1 capsule (300 mg total) by mouth 3 (three) times daily. 90 capsule 0   hydrOXYzine (VISTARIL) 25 MG capsule Take 1 capsule (25 mg total) by mouth every 8 (eight) hours as needed. (Patient taking differently: Take 25 mg by mouth every 8 (eight) hours as needed for anxiety.) 30 capsule 0   insulin detemir (LEVEMIR FLEXTOUCH) 100 UNIT/ML FlexPen Inject 16 Units into the skin 2 times daily. 30 mL 2   Insulin Pen Needle 32G X 4 MM MISC 1 Device by Does not apply route in the morning, at noon, in the evening, and at bedtime. 400 each 2   Insulin Regular Human (NOVOLIN R FLEXPEN RELION) 100 UNIT/ML KwikPen Max daily 15 units 15 mL 2   losartan (COZAAR) 25 MG tablet Take 1 tablet (25 mg total) by mouth daily. 30 tablet 1   metoCLOPramide (REGLAN) 10 MG tablet Take 1 tablet (10 mg) by mouth every 8 hours as needed for nausea. 30 tablet 0   ondansetron (ZOFRAN) 4 MG tablet Take 1 tablet (4 mg total) by mouth every 8 (eight) hours as needed for nausea or vomiting. 20 tablet 0   pantoprazole (PROTONIX) 40 MG tablet Take 1 tablet (40 mg total) by mouth 2 (two) times daily before a meal. 60 tablet 5   promethazine (PHENERGAN) 25 MG suppository Place 1 suppository (25 mg total) rectally every 6 (six) hours as needed for nausea or vomiting. (Patient not taking: Reported on 06/18/2022) 12 each 0   No current facility-administered medications for this visit.   Facility-Administered Medications Ordered in Other Visits  Medication Dose Route Frequency Provider Last Rate Last Admin   gabapentin (NEURONTIN) capsule     Provider, Generic External Data       Allergies  Allergen Reactions   Lisinopril Cough, Other (See Comments), Itching and Rash    Breakout in rash     Review of Systems: All systems reviewed and negative except where noted in HPI.    No results found.  Physical Exam: There were no vitals taken for this visit. Constitutional:  Pleasant,well-developed, ***female in no acute distress. HEENT: Normocephalic and atraumatic. Conjunctivae are normal. No scleral icterus. Neck supple.  Cardiovascular: Normal rate, regular rhythm.  Pulmonary/chest: Effort normal and breath sounds normal. No wheezing, rales or rhonchi. Abdominal: Soft, nondistended, nontender. Bowel sounds active throughout. There are no masses palpable. No hepatomegaly. Extremities: no edema Lymphadenopathy: No cervical adenopathy noted. Neurological: Alert and oriented to person place and time. Skin: Skin is warm and dry. No rashes noted. Psychiatric: Normal mood and affect. Behavior is normal.   ASSESSMENT: 32 y.o. female here for assessment of the following  No diagnosis found.  PLAN:   No ref. provider found

## 2022-08-15 LAB — HM COLONOSCOPY

## 2022-08-28 ENCOUNTER — Ambulatory Visit: Payer: Medicaid Other | Admitting: Obstetrics and Gynecology

## 2022-09-18 ENCOUNTER — Ambulatory Visit: Payer: Medicaid Other | Admitting: Internal Medicine

## 2022-09-18 NOTE — Progress Notes (Deleted)
Name: Kelli Hudson  MRN/ DOB: [id number], 1990/10/04   Age/ Sex: 32 y.o., female    PCP: Pcp, No   Reason for Endocrinology Evaluation: Type 1 Diabetes Mellitus     Date of Initial Endocrinology Visit: 12/18/2020    PATIENT IDENTIFIER: Kelli Hudson is a 32 y.o. female with a past medical history of T1DM, and gastroparesis. The patient presented for initial endocrinology clinic visit on 12/18/2020 for consultative assistance with her diabetes management.     HPI: Kelli Hudson was   Diagnosed with DM 2008 ( age 38) .Hemoglobin A1c has ranged from 9.7% in 2017, peaking at 13.9% in 2021.  Started insulin pump 01/2021  She was lost to follow-up from 04/2021 until her return over a year later 06/2022, by then she was of OmniPod and did not want to go back on it   THYROID HISTORY: Patient has been noted with thyromegaly during physical exam, which prompted a thyroid ultrasound on 03/15/2020 revealing multinodular goiter.  Two of the nodules met criteria for FNA.  Patient was lost to follow-up.  Repeat FNA has been ordered 03/2021, GSR required repeat ultrasound first as it has been over a year since the prior ultrasound, repeat ultrasound 04/2021 continue to show multinodular goiter but none met FNA criteria at the time  SUBJECTIVE:   During the last visit (04/18/2021): A1c 8.8 % adjusted MDI regimen   Today (09/18/22): Kelli Hudson is here for a follow up on diabetes management. She has NOT been to our clinic in 17 months. She has been without the pump or CGM for a while.   She has been checking glucose 3-4 x daily .  Over the past year the patient has had multiple ED visits and hospital admissions for gastroparesis.  She is scheduled for insertion of gastric stimulator through [facility] 5/8th   She she would like her Nexplanon , but she was counseled by GYN about the importance of improving A1c to 7% prior to removing Nexplanon and considering  pregnancy She had a DKA 03/04/2022  She continues with nausea and vomiting but no diarrhea  Denies local neck swelling  Denies palpitations  Denies tremors   HOME DIABETES REGIMEN: Novolin-R 1- 3 units TIDQAC Levemir 8 units BID      Statin: no ACE-I/ARB: yes Prior Diabetic Education: yes  CGM: n/a     DIABETIC COMPLICATIONS: Microvascular complications:  Gastroparesis , neuropathy , has DR ( Received injections B/L).  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 pylori gastric ulcer    Coffee ground emesis    Diabetes mellitus (HCC)    Diabetic gastroparesis (HCC)    DKA (diabetic ketoacidosis) (HCC) 02/24/2021   Gastroparesis    GERD (gastroesophageal reflux disease)    Hypertension    Hyperthyroidism    Intractable nausea and vomiting 04/20/2021   Normocytic anemia 04/20/2020   Prolonged Q-T interval on ECG    Past Surgical History:  Past Surgical History:  Procedure Laterality Date   AMPUTATION TOE Left 03/10/2018   Procedure: AMPUTATION FIFTH TOE;  Surgeon: Vivi Barrack, DPM;  Location: [facility];  Service: Podiatry;  Laterality: Left;   BIOPSY  01/28/2020   Procedure: BIOPSY;  Surgeon: Kathi Der, MD;  Location: [facility];  Service: Gastroenterology;;   BOTOX INJECTION  08/26/2021   Procedure:  BOTOX INJECTION;  Surgeon: Sherrilyn Rist, MD;  Location: [facility];  Service: Gastroenterology;;   ESOPHAGOGASTRODUODENOSCOPY N/A 01/28/2020   Procedure: ESOPHAGOGASTRODUODENOSCOPY (EGD);  Surgeon: Kathi Der, MD;  Location: [facility];  Service: Gastroenterology;  Laterality: N/A;   ESOPHAGOGASTRODUODENOSCOPY (EGD) WITH PROPOFOL Left 09/08/2015   Procedure: ESOPHAGOGASTRODUODENOSCOPY (EGD) WITH PROPOFOL;  Surgeon: Willis Modena, MD;  Location: [facility];   Service: Endoscopy;  Laterality: Left;   ESOPHAGOGASTRODUODENOSCOPY (EGD) WITH PROPOFOL N/A 08/26/2021   Procedure: ESOPHAGOGASTRODUODENOSCOPY (EGD) WITH PROPOFOL;  Surgeon: Sherrilyn Rist, MD;  Location: [facility];  Service: Gastroenterology;  Laterality: N/A;   WISDOM TOOTH EXTRACTION      Social History:  reports that she has never smoked. She has never used smokeless tobacco. She reports current alcohol use of about 1.0 standard drink of alcohol per week. She reports that she does not use drugs. Family History:  Family History  Problem Relation Age of Onset   Lung cancer Mother    Diabetes Mother    Bipolar disorder Father    Liver cancer Maternal Grandfather    Diabetes Maternal Aunt        x 2   Pancreatic cancer Maternal Uncle    Prostate cancer Maternal Uncle    Breast cancer Other        maternal great aunt   Heart disease Other    Ovarian cancer Other        maternal great aunt   Kidney disease Other        maternal great aunt   Colon cancer Neg Hx    Stomach cancer Neg Hx      HOME MEDICATIONS: Allergies as of 09/18/2022       Reactions   Lisinopril Cough, Other (See Comments), Itching, Rash   Breakout in rash        Medication List        Accurate as of September 18, 2022  8:46 AM. If you have any questions, ask your nurse or doctor.          atorvastatin 20 MG tablet Commonly known as: LIPITOR Take 1 tablet (20 mg total) by mouth daily.   carvedilol 12.5 MG tablet Commonly known as: COREG Take 12.5 mg by mouth 2 (two) times daily.   Dexcom G6 Sensor Misc 1 Device by Does not apply route as directed.   Dexcom G6 Transmitter Misc Change every 90 days   Dexcom G6 Transmitter Misc 1 Device by Does not apply route as directed.   gabapentin 300 MG capsule Commonly known as: NEURONTIN Take 1 capsule (300 mg total) by mouth 3 (three) times daily.   hydrOXYzine 25 MG capsule Commonly known as: VISTARIL Take 1 capsule (25 mg total) by mouth  every 8 (eight) hours as needed. What changed: reasons to take this   Insulin Pen Needle 32G X 4 MM Misc 1 Device by Does not apply route in the morning, at noon, in the evening, and at bedtime.   Levemir FlexTouch 100 UNIT/ML FlexTouch Pen Generic drug: insulin detemir Inject 16 Units into the skin 2 times daily.   losartan 25 MG tablet Commonly known as: COZAAR Take 1 tablet (25 mg total) by mouth daily.   metoCLOPramide 10 MG tablet Commonly known as: REGLAN Take 1 tablet (10 mg) by mouth every 8 hours as needed for nausea.   Nexplanon 68 MG Impl implant Generic drug: etonogestrel 1 each by Subdermal route once.   NovoLIN R FlexPen ReliOn 100 UNIT/ML FlexPen  Generic drug: Insulin Regular Human Max daily 15 units   ondansetron 4 MG tablet Commonly known as: ZOFRAN Take 1 tablet (4 mg total) by mouth every 8 (eight) hours as needed for nausea or vomiting.   pantoprazole 40 MG tablet Commonly known as: PROTONIX Take 1 tablet (40 mg total) by mouth 2 (two) times daily before a meal.   promethazine 25 MG suppository Commonly known as: PHENERGAN Place 1 suppository (25 mg total) rectally every 6 (six) hours as needed for nausea or vomiting.         ALLERGIES: Allergies  Allergen Reactions   Lisinopril Cough, Other (See Comments), Itching and Rash    Breakout in rash     REVIEW OF SYSTEMS: A comprehensive ROS was conducted with the patient and is negative except as per HPI     OBJECTIVE:   VITAL SIGNS: There were no vitals taken for this visit.   PHYSICAL EXAM:  General: Pt appears well and is in NAD  Neck: General: Supple without adenopathy or carotid bruits. Thyroid: Thyroid size normal.  Right nodule appreciated   Lungs: Clear with good BS bilat   Heart: RRR   Abdomen:  soft, nontender  Extremities:  Lower extremities - No pretibial edema.   Neuro: MS is good with appropriate affect, pt is alert and Ox3    DM Foot exam 06/18/2022 The skin of the  feet is without sores or ulcerations. The pedal pulses are 2+ on right and 2+ on left. The sensation is absent to a screening 5.07, 10 gram monofilament bilaterally   DATA REVIEWED:  Lab Results  Component Value Date   HGBA1C 8.8 (A) 06/18/2022   HGBA1C 11.2 (H) 03/23/2022   HGBA1C 10.3 (H) 10/06/2021     Latest Reference Range & Units 05/09/22 06:12  Sodium 135 - 145 mmol/L 134 (L)  Potassium 3.5 - 5.1 mmol/L 4.5  Chloride 98 - 111 mmol/L 108  CO2 22 - 32 mmol/L 19 (L)  Glucose 70 - 99 mg/dL 578 (H)  BUN 6 - 20 mg/dL 26 (H)  Creatinine 4.69 - 1.00 mg/dL 6.29 (H)  Calcium 8.9 - 10.3 mg/dL 8.9  Anion gap 5 - 15  7  GFR, Estimated >60 mL/min >60  (L): Data is abnormally low (H): Data is abnormally high       Thyroid ultrasound 05/15/2021  Nodule # 1:   Prior biopsy: No   Location: Right; mid   Maximum size: 1.3 cm; Other 2 dimensions: 0.9 x 0.7 cm, previously, 1.3 x 0.9 x 0.8 cm   Composition: solid/almost completely solid (2)   Echogenicity: isoechoic (1)   Shape: taller-than-wide (3)   Margins: ill-defined (0)   Echogenic foci: none (0)   ACR TI-RADS total points: 6.   ACR TI-RADS risk category:  TR4 (4-6 points).   Significant change in size (>/= 20% in two dimensions and minimal increase of 2 mm): No   Change in features: No   Change in ACR TI-RADS risk category: No   ACR TI-RADS recommendations:   *Given size (>/= 1 - 1.4 cm) and appearance, a follow-up ultrasound in 1 year should be considered based on TI-RADS criteria.   _________________________________________________________   Nodule # 2:   Prior biopsy: No   Location: Right; inferior   Maximum size: 1.3 cm; Other 2 dimensions: 1.2 x 1.1 cm, previously, 1.5 x 1.3 x 1.2 cm   Composition: solid/almost completely solid (2)   Echogenicity: hypoechoic (2)   Shape: not taller-than-wide (  0)   Margins: smooth (0)   Echogenic foci: none (0)   ACR TI-RADS total points: 4.   ACR  TI-RADS risk category:  TR4 (4-6 points).   Significant change in size (>/= 20% in two dimensions and minimal increase of 2 mm): No   Change in features: No   Change in ACR TI-RADS risk category: No   ACR TI-RADS recommendations:   *Given size (>/= 1 - 1.4 cm) and appearance, a follow-up ultrasound in 1 year should be considered based on TI-RADS criteria.   _________________________________________________________   Mildly enlarged left neck lymph node is seen.   IMPRESSION: 1. Nodule 1 (TI-RADS 4), measuring 1.3 cm, located in the mid right thyroid lobe, is unchanged in size since 03/15/2020. Annual ultrasound surveillance is recommended until 5 years of stability is documented. 2. Nodule 2 (TI-RADS 4), measuring 1.3 cm, located in the inferior right thyroid lobe, is unchanged in size since 03/15/2020. Annual ultrasound surveillance is recommended until 5 years of stability is documented. 3. Mildly enlarged left submandibular neck lymph node measuring up to 10 mm in short axis. Etiology of this lymph node is uncertain but most likely relates to inflammation. A follow-up ultrasound should be performed in 8-12 weeks to again evaluate this lymph node. Ultrasound should be performed earlier if there is clinical evidence of growth.     ABI 03/13/2021 Right: Resting right ankle-brachial index is within normal range. No  evidence of significant right lower extremity arterial disease. The right  toe-brachial index is normal.   Left: Resting left ankle-brachial index is within normal range. No  evidence of significant left lower extremity arterial disease. The left  toe-brachial index is normal.     In office BG 249 mg/dL  ASSESSMENT / PLAN / RECOMMENDATIONS:   1) Type 1 Diabetes Mellitus, Poorly controlled, With retinopathic, neuropathic complications and gastroparesis  - Most recent A1c of 8.8 %. Goal A1c < 7.0 %.    -She has not been to our clinic in 14 months. She has  been on MDI regimen  -  Pt would like to remain on MDI regimen, does not want the pump again  - She needs a refill on Dexcom G6, does not want to upgrade at this time  -Pre-conception counseling done, with A1c of 6.0 -6.5% goal during pregnancy with fasting goal 60-90 mg/dL   -She has been eating carbohydrates 1 meal a day, the other 2 meals she eliminates carbohydrate is much as possible -She will be given a standing dose of regular insulin with those meals and a new correction scale will be provided -She did not want to change her Levemir as she just received a new prescription, will continue but increase the dose as below   MEDICATIONS: Increase Levemir 10 units BID Novolin - R 1 unit with a low-carb meal, 4 units with a high carb meal CF: Novolin (BG-130/40)   EDUCATION / INSTRUCTIONS: BG monitoring instructions: Patient is instructed to check her blood sugars 3 times a day, before meals . Call [facility] if: BG persistently < 70  I reviewed the Rule of 15 for the treatment of hypoglycemia in detail with the patient. Literature supplied.   2) Diabetic complications:  Eye: Does  have known diabetic retinopathy.  Neuro/ Feet: Does  have known diabetic peripheral neuropathy. Renal: Patient does not have known baseline CKD. She is  on an ACEI/ARB at present   3) Multinodular Goiter :  -Previous ultrasound from 02/2020 shows multinodular  goiter and 2 of these nodules meet FNA criteria. -Repeat ultrasound in a year later continue to show multinodular goiter but none met FNA criteria -I had ordered an ultrasound in May 2024, but this has not been done yet  F/U in 3 months    Signed electronically by: Lyndle Herrlich, MD  [facility] Endocrinology  [facility] [address] [place] [place], [place] [address] Phone: [phone] FAX: [phone]   CC: Pcp, No No address on file Phone: None  Fax: None    Return to Endocrinology  clinic as below: Future Appointments  Date Time Provider Department Center  09/18/2022 12:10 PM , Konrad Dolores, MD [id number] None

## 2022-09-19 ENCOUNTER — Encounter: Payer: Self-pay | Admitting: Internal Medicine

## 2022-09-22 ENCOUNTER — Ambulatory Visit: Payer: Medicaid Other | Admitting: Internal Medicine

## 2022-09-22 NOTE — Progress Notes (Deleted)
Name: Kelli Hudson  MRN/ DOB: [id number], 1991-01-10   Age/ Sex: 32 y.o., female    PCP: Pcp, No   Reason for Endocrinology Evaluation: Type 1 Diabetes Mellitus     Date of Initial Endocrinology Visit: 12/18/2020    PATIENT IDENTIFIER: [name] is a 32 y.o. female with a past medical history of T1DM, and gastroparesis. The patient presented for initial endocrinology clinic visit on 12/18/2020 for consultative assistance with her diabetes management.     HPI: [name] was   Diagnosed with DM 2008 ( age 10) .Hemoglobin A1c has ranged from 9.7% in 2017, peaking at 13.9% in 2021.  Started insulin pump 01/2021  She was lost to follow-up from 04/2021 until her return over a year later 06/2022, by then she was of OmniPod and did not want to go back on it   THYROID HISTORY: Patient has been noted with thyromegaly during physical exam, which prompted a thyroid ultrasound on 03/15/2020 revealing multinodular goiter.  Two of the nodules met criteria for FNA.  Patient was lost to follow-up.  Repeat FNA has been ordered 03/2021, GSR required repeat ultrasound first as it has been over a year since the prior ultrasound, repeat ultrasound 04/2021 continue to show multinodular goiter but none met FNA criteria at the time  SUBJECTIVE:   During the last visit (04/18/2021): A1c 8.8 % adjusted MDI regimen   Today (09/22/22): Kelli Hudson is here for a follow up on diabetes management. She has NOT been to our clinic in 17 months. She has been without the pump or CGM for a while.   She has been checking glucose 3-4 x daily .   Over the past year the patient has had multiple ED visits and hospital admissions for gastroparesis.  She is scheduled for insertion of gastric stimulator through [facility] 5/8th      Denies local neck swelling  Denies palpitations  Denies tremors   HOME DIABETES REGIMEN: Novolin-R 1- 3 units TIDQAC Levemir 8 units BID       Statin: no ACE-I/ARB: yes Prior Diabetic Education: yes  CGM: n/a     DIABETIC COMPLICATIONS: Microvascular complications:  Gastroparesis , neuropathy , has DR ( Received injections B/L).  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 pylori gastric ulcer    Coffee ground emesis    Diabetes mellitus (HCC)    Diabetic gastroparesis (HCC)    DKA (diabetic ketoacidosis) (HCC) 02/24/2021   Gastroparesis    GERD (gastroesophageal reflux disease)    Hypertension    Hyperthyroidism    Intractable nausea and vomiting 04/20/2021   Normocytic anemia 04/20/2020   Prolonged Q-T interval on ECG    Past Surgical History:  Past Surgical History:  Procedure Laterality Date   AMPUTATION TOE Left 03/10/2018   Procedure: AMPUTATION FIFTH TOE;  Surgeon: Vivi Barrack, DPM;  Location: [facility];  Service: Podiatry;  Laterality: Left;   BIOPSY  01/28/2020   Procedure: BIOPSY;  Surgeon: Kathi Der, MD;  Location: [facility];  Service: Gastroenterology;;   BOTOX INJECTION  08/26/2021   Procedure: BOTOX INJECTION;  Surgeon: Sherrilyn Rist, MD;  Location: [facility];  Service: Gastroenterology;;   ESOPHAGOGASTRODUODENOSCOPY N/A 01/28/2020   Procedure: ESOPHAGOGASTRODUODENOSCOPY (EGD);  Surgeon: Kathi Der, MD;  Location: [facility];  Service: Gastroenterology;  Laterality:  N/A;   ESOPHAGOGASTRODUODENOSCOPY (EGD) WITH PROPOFOL Left 09/08/2015   Procedure: ESOPHAGOGASTRODUODENOSCOPY (EGD) WITH PROPOFOL;  Surgeon: Willis Modena, MD;  Location: [facility];  Service: Endoscopy;  Laterality: Left;   ESOPHAGOGASTRODUODENOSCOPY (EGD) WITH PROPOFOL N/A 08/26/2021   Procedure: ESOPHAGOGASTRODUODENOSCOPY (EGD) WITH PROPOFOL;  Surgeon: Sherrilyn Rist, MD;  Location: [facility];  Service:  Gastroenterology;  Laterality: N/A;   WISDOM TOOTH EXTRACTION      Social History:  reports that she has never smoked. She has never used smokeless tobacco. She reports current alcohol use of about 1.0 standard drink of alcohol per week. She reports that she does not use drugs. Family History:  Family History  Problem Relation Age of Onset   Lung cancer Mother    Diabetes Mother    Bipolar disorder Father    Liver cancer Maternal Grandfather    Diabetes Maternal Aunt        x 2   Pancreatic cancer Maternal Uncle    Prostate cancer Maternal Uncle    Breast cancer Other        maternal great aunt   Heart disease Other    Ovarian cancer Other        maternal great aunt   Kidney disease Other        maternal great aunt   Colon cancer Neg Hx    Stomach cancer Neg Hx      HOME MEDICATIONS: Allergies as of 09/22/2022       Reactions   Lisinopril Cough, Other (See Comments), Itching, Rash   Breakout in rash        Medication List        Accurate as of September 22, 2022  7:05 AM. If you have any questions, ask your nurse or doctor.          atorvastatin 20 MG tablet Commonly known as: LIPITOR Take 1 tablet (20 mg total) by mouth daily.   carvedilol 12.5 MG tablet Commonly known as: COREG Take 12.5 mg by mouth 2 (two) times daily.   Dexcom G6 Sensor Misc 1 Device by Does not apply route as directed.   Dexcom G6 Transmitter Misc Change every 90 days   Dexcom G6 Transmitter Misc 1 Device by Does not apply route as directed.   gabapentin 300 MG capsule Commonly known as: NEURONTIN Take 1 capsule (300 mg total) by mouth 3 (three) times daily.   hydrOXYzine 25 MG capsule Commonly known as: VISTARIL Take 1 capsule (25 mg total) by mouth every 8 (eight) hours as needed. What changed: reasons to take this   Insulin Pen Needle 32G X 4 MM Misc 1 Device by Does not apply route in the morning, at noon, in the evening, and at bedtime.   Levemir FlexTouch 100 UNIT/ML  FlexTouch Pen Generic drug: insulin detemir Inject 16 Units into the skin 2 times daily.   losartan 25 MG tablet Commonly known as: COZAAR Take 1 tablet (25 mg total) by mouth daily.   metoCLOPramide 10 MG tablet Commonly known as: REGLAN Take 1 tablet (10 mg) by mouth every 8 hours as needed for nausea.   Nexplanon 68 MG Impl implant Generic drug: etonogestrel 1 each by Subdermal route once.   NovoLIN R FlexPen ReliOn 100 UNIT/ML FlexPen Generic drug: Insulin Regular Human Max daily 15 units   ondansetron 4 MG tablet Commonly known as: ZOFRAN Take 1 tablet (4 mg total) by mouth every 8 (eight) hours as needed for nausea or vomiting.   pantoprazole  40 MG tablet Commonly known as: PROTONIX Take 1 tablet (40 mg total) by mouth 2 (two) times daily before a meal.   promethazine 25 MG suppository Commonly known as: PHENERGAN Place 1 suppository (25 mg total) rectally every 6 (six) hours as needed for nausea or vomiting.         ALLERGIES: Allergies  Allergen Reactions   Lisinopril Cough, Other (See Comments), Itching and Rash    Breakout in rash     REVIEW OF SYSTEMS: A comprehensive ROS was conducted with the patient and is negative except as per HPI     OBJECTIVE:   VITAL SIGNS: There were no vitals taken for this visit.   PHYSICAL EXAM:  General: Pt appears well and is in NAD  Neck: General: Supple without adenopathy or carotid bruits. Thyroid: Thyroid size normal.  Right nodule appreciated   Lungs: Clear with good BS bilat   Heart: RRR   Abdomen:  soft, nontender  Extremities:  Lower extremities - No pretibial edema.   Neuro: MS is good with appropriate affect, pt is alert and Ox3    DM Foot exam 06/18/2022 The skin of the feet is without sores or ulcerations. The pedal pulses are 2+ on right and 2+ on left. The sensation is absent to a screening 5.07, 10 gram monofilament bilaterally   DATA REVIEWED:  Lab Results  Component Value Date   HGBA1C  8.8 (A) 06/18/2022   HGBA1C 11.2 (H) 03/23/2022   HGBA1C 10.3 (H) 10/06/2021     Latest Reference Range & Units 05/09/22 06:12  Sodium 135 - 145 mmol/L 134 (L)  Potassium 3.5 - 5.1 mmol/L 4.5  Chloride 98 - 111 mmol/L 108  CO2 22 - 32 mmol/L 19 (L)  Glucose 70 - 99 mg/dL 161 (H)  BUN 6 - 20 mg/dL 26 (H)  Creatinine 0.96 - 1.00 mg/dL 0.45 (H)  Calcium 8.9 - 10.3 mg/dL 8.9  Anion gap 5 - 15  7  GFR, Estimated >60 mL/min >60  (L): Data is abnormally low (H): Data is abnormally high       Thyroid ultrasound 05/15/2021  Nodule # 1:   Prior biopsy: No   Location: Right; mid   Maximum size: 1.3 cm; Other 2 dimensions: 0.9 x 0.7 cm, previously, 1.3 x 0.9 x 0.8 cm   Composition: solid/almost completely solid (2)   Echogenicity: isoechoic (1)   Shape: taller-than-wide (3)   Margins: ill-defined (0)   Echogenic foci: none (0)   ACR TI-RADS total points: 6.   ACR TI-RADS risk category:  TR4 (4-6 points).   Significant change in size (>/= 20% in two dimensions and minimal increase of 2 mm): No   Change in features: No   Change in ACR TI-RADS risk category: No   ACR TI-RADS recommendations:   *Given size (>/= 1 - 1.4 cm) and appearance, a follow-up ultrasound in 1 year should be considered based on TI-RADS criteria.   _________________________________________________________   Nodule # 2:   Prior biopsy: No   Location: Right; inferior   Maximum size: 1.3 cm; Other 2 dimensions: 1.2 x 1.1 cm, previously, 1.5 x 1.3 x 1.2 cm   Composition: solid/almost completely solid (2)   Echogenicity: hypoechoic (2)   Shape: not taller-than-wide (0)   Margins: smooth (0)   Echogenic foci: none (0)   ACR TI-RADS total points: 4.   ACR TI-RADS risk category:  TR4 (4-6 points).   Significant change in size (>/= 20% in two dimensions  and minimal increase of 2 mm): No   Change in features: No   Change in ACR TI-RADS risk category: No   ACR TI-RADS  recommendations:   *Given size (>/= 1 - 1.4 cm) and appearance, a follow-up ultrasound in 1 year should be considered based on TI-RADS criteria.   _________________________________________________________   Mildly enlarged left neck lymph node is seen.   IMPRESSION: 1. Nodule 1 (TI-RADS 4), measuring 1.3 cm, located in the mid right thyroid lobe, is unchanged in size since 03/15/2020. Annual ultrasound surveillance is recommended until 5 years of stability is documented. 2. Nodule 2 (TI-RADS 4), measuring 1.3 cm, located in the inferior right thyroid lobe, is unchanged in size since 03/15/2020. Annual ultrasound surveillance is recommended until 5 years of stability is documented. 3. Mildly enlarged left submandibular neck lymph node measuring up to 10 mm in short axis. Etiology of this lymph node is uncertain but most likely relates to inflammation. A follow-up ultrasound should be performed in 8-12 weeks to again evaluate this lymph node. Ultrasound should be performed earlier if there is clinical evidence of growth.     ABI 03/13/2021 Right: Resting right ankle-brachial index is within normal range. No  evidence of significant right lower extremity arterial disease. The right  toe-brachial index is normal.   Left: Resting left ankle-brachial index is within normal range. No  evidence of significant left lower extremity arterial disease. The left  toe-brachial index is normal.     In office BG 249 mg/dL  ASSESSMENT / PLAN / RECOMMENDATIONS:   1) Type 1 Diabetes Mellitus, Poorly controlled, With retinopathic, neuropathic complications and gastroparesis  - Most recent A1c of 8.8 %. Goal A1c < 7.0 %.    -She has not been to our clinic in 14 months. She has been on MDI regimen  -  Pt would like to remain on MDI regimen, does not want the pump again  - She needs a refill on Dexcom G6, does not want to upgrade at this time  -Pre-conception counseling done, with A1c of 6.0  -6.5% goal during pregnancy with fasting goal 60-90 mg/dL   -She has been eating carbohydrates 1 meal a day, the other 2 meals she eliminates carbohydrate is much as possible -She will be given a standing dose of regular insulin with those meals and a new correction scale will be provided -She did not want to change her Levemir as she just received a new prescription, will continue but increase the dose as below   MEDICATIONS: Increase Levemir 10 units BID Novolin - R 1 unit with a low-carb meal, 4 units with a high carb meal CF: Novolin (BG-130/40)   EDUCATION / INSTRUCTIONS: BG monitoring instructions: Patient is instructed to check her blood sugars 3 times a day, before meals . Call [facility] if: BG persistently < 70  I reviewed the Rule of 15 for the treatment of hypoglycemia in detail with the patient. Literature supplied.   2) Diabetic complications:  Eye: Does  have known diabetic retinopathy.  Neuro/ Feet: Does  have known diabetic peripheral neuropathy. Renal: Patient does not have known baseline CKD. She is  on an ACEI/ARB at present   3) Multinodular Goiter :  -Previous ultrasound from 02/2020 shows multinodular goiter and 2 of these nodules meet FNA criteria. -Repeat ultrasound in a year later continue to show multinodular goiter but none met FNA criteria -I had ordered an ultrasound in May 2024, but this has not been done yet  F/U in 3 months    Signed electronically by: Lyndle Herrlich, MD  [facility]  [facility] [address] Laurell [address] [place], [place] [address] Phone: [phone] FAX: [phone]   CC: Pcp, No No address on file Phone: None  Fax: None    Return to Endocrinology clinic as below: Future Appointments  Date Time Provider Department Center  09/22/2022  8:10 AM , Konrad Dolores, MD [facility] None

## 2022-09-25 ENCOUNTER — Ambulatory Visit (INDEPENDENT_AMBULATORY_CARE_PROVIDER_SITE_OTHER): Payer: Medicaid Other

## 2022-09-25 ENCOUNTER — Ambulatory Visit: Payer: Medicaid Other | Admitting: Podiatry

## 2022-09-25 DIAGNOSIS — L97522 Non-pressure chronic ulcer of other part of left foot with fat layer exposed: Secondary | ICD-10-CM

## 2022-09-25 DIAGNOSIS — M79671 Pain in right foot: Secondary | ICD-10-CM

## 2022-09-25 DIAGNOSIS — L97512 Non-pressure chronic ulcer of other part of right foot with fat layer exposed: Secondary | ICD-10-CM | POA: Diagnosis not present

## 2022-09-25 MED ORDER — DOXYCYCLINE HYCLATE 100 MG PO TABS: 100.0000 mg | ORAL_TABLET | Freq: Two times a day (BID) | ORAL | 0 refills | Status: DC

## 2022-09-25 MED ORDER — SILVER SULFADIAZINE 1 % EX CREA: 1.0000 | TOPICAL_CREAM | Freq: Every day | CUTANEOUS | 0 refills | Status: DC

## 2022-09-25 NOTE — Patient Instructions (Signed)
Wash with soap and water, dry well. Apply a small amount of silvadene and apply a dressing. Wear surgical shoes. Elevate foot. Monitor for any signs/symptoms of infection. Call the office immediately if any occur or go directly to the emergency room. Call with any questions/concerns.

## 2022-09-30 NOTE — Progress Notes (Signed)
Subjective: Chief Complaint  Patient presents with   Blister    32 year old female presents the office with above concerns.  She states that she had blisters and burns her feet have opened up.  She has been trying to exercise more which may have started this but no other injuries recalls.  She does not report any fevers or chills.  Objective: AAO x3, NAD DP/PT pulses palpable bilaterally, CRT less than 3 seconds Sensation decreased On the plantar aspects of bilateral feet submetatarsal area as pictured below is large areas of skin breakdown from blisters as well as the plantar aspect the left hallux.  There is localized edema and erythema but there is no ascending cellulitis.  No fluctuation or crepitation.  No malodor.  There is no probe to bone, and or tunneling. No pain with calf compression, swelling, warmth, erythema           Assessment: Ulcerations bilaterally localized infection  Plan: -All treatment options discussed with the patient including all alternatives, risks, complications -X-rays were obtained reviewed bilaterally.  3 views of the feet were obtained.  No cortical destruction suggest osteomyelitis.  No soft tissue -This sharply debrided was tissue to the #312 with scalpel to any complications and there was minimal bleeding.  Hemostasis sheath and manual compression. With saline.  Silvadene applied followed by dressing. -Continue Silvadene dressing changes daily at home. -Doxycycline -ABI ordered -Monitor for any clinical signs or symptoms of infection and directed to call the office immediately should any occur or go to the ER.  Return for foot ulcers 7-10 days.  Vivi Barrack DPM

## 2022-10-03 ENCOUNTER — Inpatient Hospital Stay (HOSPITAL_BASED_OUTPATIENT_CLINIC_OR_DEPARTMENT_OTHER)
Admission: EM | Admit: 2022-10-03 | Discharge: 2022-11-22 | DRG: 853 | Disposition: A | Discharge status: Skilled Nursing Facility | Payer: Medicaid Other | Attending: Internal Medicine | Admitting: Internal Medicine

## 2022-10-03 ENCOUNTER — Emergency Department (HOSPITAL_BASED_OUTPATIENT_CLINIC_OR_DEPARTMENT_OTHER): Payer: Medicaid Other | Admitting: Radiology

## 2022-10-03 ENCOUNTER — Emergency Department (HOSPITAL_BASED_OUTPATIENT_CLINIC_OR_DEPARTMENT_OTHER): Payer: Medicaid Other

## 2022-10-03 ENCOUNTER — Other Ambulatory Visit (HOSPITAL_BASED_OUTPATIENT_CLINIC_OR_DEPARTMENT_OTHER): Payer: Self-pay

## 2022-10-03 ENCOUNTER — Encounter (HOSPITAL_BASED_OUTPATIENT_CLINIC_OR_DEPARTMENT_OTHER): Payer: Self-pay | Admitting: Emergency Medicine

## 2022-10-03 ENCOUNTER — Other Ambulatory Visit: Payer: Self-pay

## 2022-10-03 DIAGNOSIS — E10628 Type 1 diabetes mellitus with other skin complications: Secondary | ICD-10-CM | POA: Diagnosis present

## 2022-10-03 DIAGNOSIS — Z1152 Encounter for screening for COVID-19: Secondary | ICD-10-CM | POA: Diagnosis not present

## 2022-10-03 DIAGNOSIS — L97909 Non-pressure chronic ulcer of unspecified part of unspecified lower leg with unspecified severity: Secondary | ICD-10-CM | POA: Diagnosis not present

## 2022-10-03 DIAGNOSIS — M8618 Other acute osteomyelitis, other site: Secondary | ICD-10-CM | POA: Diagnosis not present

## 2022-10-03 DIAGNOSIS — K3184 Gastroparesis: Secondary | ICD-10-CM | POA: Diagnosis not present

## 2022-10-03 DIAGNOSIS — E1169 Type 2 diabetes mellitus with other specified complication: Secondary | ICD-10-CM | POA: Diagnosis not present

## 2022-10-03 DIAGNOSIS — E1121 Type 2 diabetes mellitus with diabetic nephropathy: Secondary | ICD-10-CM | POA: Diagnosis not present

## 2022-10-03 DIAGNOSIS — Z9911 Dependence on respirator [ventilator] status: Secondary | ICD-10-CM | POA: Diagnosis not present

## 2022-10-03 DIAGNOSIS — L97519 Non-pressure chronic ulcer of other part of right foot with unspecified severity: Secondary | ICD-10-CM | POA: Diagnosis not present

## 2022-10-03 DIAGNOSIS — E1022 Type 1 diabetes mellitus with diabetic chronic kidney disease: Secondary | ICD-10-CM | POA: Diagnosis present

## 2022-10-03 DIAGNOSIS — T8130XA Disruption of wound, unspecified, initial encounter: Secondary | ICD-10-CM | POA: Diagnosis not present

## 2022-10-03 DIAGNOSIS — E119 Type 2 diabetes mellitus without complications: Secondary | ICD-10-CM | POA: Diagnosis not present

## 2022-10-03 DIAGNOSIS — E1010 Type 1 diabetes mellitus with ketoacidosis without coma: Secondary | ICD-10-CM

## 2022-10-03 DIAGNOSIS — D62 Acute posthemorrhagic anemia: Secondary | ICD-10-CM | POA: Diagnosis not present

## 2022-10-03 DIAGNOSIS — E1065 Type 1 diabetes mellitus with hyperglycemia: Secondary | ICD-10-CM | POA: Diagnosis present

## 2022-10-03 DIAGNOSIS — E1069 Type 1 diabetes mellitus with other specified complication: Secondary | ICD-10-CM | POA: Diagnosis present

## 2022-10-03 DIAGNOSIS — L97522 Non-pressure chronic ulcer of other part of left foot with fat layer exposed: Secondary | ICD-10-CM

## 2022-10-03 DIAGNOSIS — E43 Unspecified severe protein-calorie malnutrition: Secondary | ICD-10-CM | POA: Diagnosis present

## 2022-10-03 DIAGNOSIS — E440 Moderate protein-calorie malnutrition: Secondary | ICD-10-CM

## 2022-10-03 DIAGNOSIS — F419 Anxiety disorder, unspecified: Secondary | ICD-10-CM | POA: Diagnosis present

## 2022-10-03 DIAGNOSIS — L02415 Cutaneous abscess of right lower limb: Secondary | ICD-10-CM | POA: Diagnosis present

## 2022-10-03 DIAGNOSIS — Z79899 Other long term (current) drug therapy: Secondary | ICD-10-CM

## 2022-10-03 DIAGNOSIS — Z794 Long term (current) use of insulin: Secondary | ICD-10-CM | POA: Diagnosis not present

## 2022-10-03 DIAGNOSIS — E871 Hypo-osmolality and hyponatremia: Secondary | ICD-10-CM | POA: Diagnosis present

## 2022-10-03 DIAGNOSIS — R6520 Severe sepsis without septic shock: Secondary | ICD-10-CM | POA: Diagnosis present

## 2022-10-03 DIAGNOSIS — Z841 Family history of disorders of kidney and ureter: Secondary | ICD-10-CM

## 2022-10-03 DIAGNOSIS — R45851 Suicidal ideations: Secondary | ICD-10-CM | POA: Diagnosis not present

## 2022-10-03 DIAGNOSIS — J9620 Acute and chronic respiratory failure, unspecified whether with hypoxia or hypercapnia: Secondary | ICD-10-CM | POA: Diagnosis present

## 2022-10-03 DIAGNOSIS — L02612 Cutaneous abscess of left foot: Secondary | ICD-10-CM | POA: Diagnosis present

## 2022-10-03 DIAGNOSIS — I96 Gangrene, not elsewhere classified: Secondary | ICD-10-CM | POA: Diagnosis not present

## 2022-10-03 DIAGNOSIS — M726 Necrotizing fasciitis: Secondary | ICD-10-CM | POA: Diagnosis present

## 2022-10-03 DIAGNOSIS — I10 Essential (primary) hypertension: Secondary | ICD-10-CM | POA: Diagnosis not present

## 2022-10-03 DIAGNOSIS — Z1612 Extended spectrum beta lactamase (ESBL) resistance: Secondary | ICD-10-CM | POA: Diagnosis present

## 2022-10-03 DIAGNOSIS — Z765 Malingerer [conscious simulation]: Secondary | ICD-10-CM | POA: Diagnosis not present

## 2022-10-03 DIAGNOSIS — L97529 Non-pressure chronic ulcer of other part of left foot with unspecified severity: Secondary | ICD-10-CM | POA: Diagnosis not present

## 2022-10-03 DIAGNOSIS — Z89422 Acquired absence of other left toe(s): Secondary | ICD-10-CM

## 2022-10-03 DIAGNOSIS — E1043 Type 1 diabetes mellitus with diabetic autonomic (poly)neuropathy: Secondary | ICD-10-CM | POA: Diagnosis present

## 2022-10-03 DIAGNOSIS — L02611 Cutaneous abscess of right foot: Secondary | ICD-10-CM | POA: Diagnosis present

## 2022-10-03 DIAGNOSIS — N182 Chronic kidney disease, stage 2 (mild): Secondary | ICD-10-CM | POA: Diagnosis not present

## 2022-10-03 DIAGNOSIS — E10621 Type 1 diabetes mellitus with foot ulcer: Secondary | ICD-10-CM | POA: Diagnosis present

## 2022-10-03 DIAGNOSIS — E8770 Fluid overload, unspecified: Secondary | ICD-10-CM | POA: Diagnosis not present

## 2022-10-03 DIAGNOSIS — E103593 Type 1 diabetes mellitus with proliferative diabetic retinopathy without macular edema, bilateral: Secondary | ICD-10-CM

## 2022-10-03 DIAGNOSIS — Z801 Family history of malignant neoplasm of trachea, bronchus and lung: Secondary | ICD-10-CM

## 2022-10-03 DIAGNOSIS — N1831 Chronic kidney disease, stage 3a: Secondary | ICD-10-CM | POA: Diagnosis present

## 2022-10-03 DIAGNOSIS — F331 Major depressive disorder, recurrent, moderate: Secondary | ICD-10-CM | POA: Diagnosis present

## 2022-10-03 DIAGNOSIS — N170 Acute kidney failure with tubular necrosis: Secondary | ICD-10-CM | POA: Diagnosis present

## 2022-10-03 DIAGNOSIS — L03116 Cellulitis of left lower limb: Secondary | ICD-10-CM | POA: Diagnosis present

## 2022-10-03 DIAGNOSIS — D649 Anemia, unspecified: Secondary | ICD-10-CM | POA: Diagnosis not present

## 2022-10-03 DIAGNOSIS — E0590 Thyrotoxicosis, unspecified without thyrotoxic crisis or storm: Secondary | ICD-10-CM | POA: Diagnosis not present

## 2022-10-03 DIAGNOSIS — D75839 Thrombocytosis, unspecified: Secondary | ICD-10-CM | POA: Diagnosis present

## 2022-10-03 DIAGNOSIS — Z6834 Body mass index (BMI) 34.0-34.9, adult: Secondary | ICD-10-CM

## 2022-10-03 DIAGNOSIS — D65 Disseminated intravascular coagulation [defibrination syndrome]: Secondary | ICD-10-CM | POA: Diagnosis not present

## 2022-10-03 DIAGNOSIS — E8721 Acute metabolic acidosis: Secondary | ICD-10-CM | POA: Diagnosis present

## 2022-10-03 DIAGNOSIS — F0631 Mood disorder due to known physiological condition with depressive features: Secondary | ICD-10-CM | POA: Diagnosis not present

## 2022-10-03 DIAGNOSIS — L03115 Cellulitis of right lower limb: Secondary | ICD-10-CM | POA: Diagnosis present

## 2022-10-03 DIAGNOSIS — F418 Other specified anxiety disorders: Secondary | ICD-10-CM | POA: Diagnosis not present

## 2022-10-03 DIAGNOSIS — L89012 Pressure ulcer of right elbow, stage 2: Secondary | ICD-10-CM | POA: Diagnosis not present

## 2022-10-03 DIAGNOSIS — A419 Sepsis, unspecified organism: Secondary | ICD-10-CM | POA: Diagnosis present

## 2022-10-03 DIAGNOSIS — E1143 Type 2 diabetes mellitus with diabetic autonomic (poly)neuropathy: Secondary | ICD-10-CM

## 2022-10-03 DIAGNOSIS — E875 Hyperkalemia: Secondary | ICD-10-CM | POA: Diagnosis not present

## 2022-10-03 DIAGNOSIS — I9581 Postprocedural hypotension: Secondary | ICD-10-CM | POA: Diagnosis not present

## 2022-10-03 DIAGNOSIS — E10622 Type 1 diabetes mellitus with other skin ulcer: Secondary | ICD-10-CM | POA: Diagnosis present

## 2022-10-03 DIAGNOSIS — Z818 Family history of other mental and behavioral disorders: Secondary | ICD-10-CM

## 2022-10-03 DIAGNOSIS — E109 Type 1 diabetes mellitus without complications: Secondary | ICD-10-CM | POA: Diagnosis present

## 2022-10-03 DIAGNOSIS — L03119 Cellulitis of unspecified part of limb: Secondary | ICD-10-CM | POA: Diagnosis present

## 2022-10-03 DIAGNOSIS — Z803 Family history of malignant neoplasm of breast: Secondary | ICD-10-CM

## 2022-10-03 DIAGNOSIS — B9561 Methicillin susceptible Staphylococcus aureus infection as the cause of diseases classified elsewhere: Secondary | ICD-10-CM | POA: Diagnosis present

## 2022-10-03 DIAGNOSIS — E785 Hyperlipidemia, unspecified: Secondary | ICD-10-CM | POA: Diagnosis present

## 2022-10-03 DIAGNOSIS — Z7189 Other specified counseling: Secondary | ICD-10-CM | POA: Diagnosis not present

## 2022-10-03 DIAGNOSIS — I129 Hypertensive chronic kidney disease with stage 1 through stage 4 chronic kidney disease, or unspecified chronic kidney disease: Secondary | ICD-10-CM | POA: Diagnosis present

## 2022-10-03 DIAGNOSIS — Z515 Encounter for palliative care: Secondary | ICD-10-CM | POA: Diagnosis not present

## 2022-10-03 DIAGNOSIS — M868X7 Other osteomyelitis, ankle and foot: Secondary | ICD-10-CM | POA: Diagnosis present

## 2022-10-03 DIAGNOSIS — Z89611 Acquired absence of right leg above knee: Secondary | ICD-10-CM | POA: Diagnosis not present

## 2022-10-03 DIAGNOSIS — R112 Nausea with vomiting, unspecified: Secondary | ICD-10-CM

## 2022-10-03 DIAGNOSIS — N179 Acute kidney failure, unspecified: Secondary | ICD-10-CM | POA: Diagnosis not present

## 2022-10-03 DIAGNOSIS — I739 Peripheral vascular disease, unspecified: Secondary | ICD-10-CM | POA: Diagnosis not present

## 2022-10-03 DIAGNOSIS — R509 Fever, unspecified: Secondary | ICD-10-CM | POA: Diagnosis not present

## 2022-10-03 DIAGNOSIS — Z89612 Acquired absence of left leg above knee: Secondary | ICD-10-CM | POA: Diagnosis not present

## 2022-10-03 DIAGNOSIS — B965 Pseudomonas (aeruginosa) (mallei) (pseudomallei) as the cause of diseases classified elsewhere: Secondary | ICD-10-CM | POA: Diagnosis present

## 2022-10-03 DIAGNOSIS — D72823 Leukemoid reaction: Secondary | ICD-10-CM | POA: Diagnosis not present

## 2022-10-03 DIAGNOSIS — E039 Hypothyroidism, unspecified: Secondary | ICD-10-CM | POA: Diagnosis not present

## 2022-10-03 DIAGNOSIS — Z9682 Presence of neurostimulator: Secondary | ICD-10-CM

## 2022-10-03 DIAGNOSIS — L02416 Cutaneous abscess of left lower limb: Secondary | ICD-10-CM | POA: Diagnosis present

## 2022-10-03 DIAGNOSIS — E1042 Type 1 diabetes mellitus with diabetic polyneuropathy: Secondary | ICD-10-CM | POA: Diagnosis present

## 2022-10-03 DIAGNOSIS — G8929 Other chronic pain: Secondary | ICD-10-CM | POA: Diagnosis present

## 2022-10-03 DIAGNOSIS — Z800 Family history of malignant neoplasm of digestive organs: Secondary | ICD-10-CM

## 2022-10-03 DIAGNOSIS — L97509 Non-pressure chronic ulcer of other part of unspecified foot with unspecified severity: Secondary | ICD-10-CM | POA: Diagnosis not present

## 2022-10-03 DIAGNOSIS — R52 Pain, unspecified: Secondary | ICD-10-CM | POA: Diagnosis not present

## 2022-10-03 DIAGNOSIS — D72829 Elevated white blood cell count, unspecified: Secondary | ICD-10-CM | POA: Diagnosis not present

## 2022-10-03 DIAGNOSIS — Z833 Family history of diabetes mellitus: Secondary | ICD-10-CM

## 2022-10-03 DIAGNOSIS — Z8249 Family history of ischemic heart disease and other diseases of the circulatory system: Secondary | ICD-10-CM

## 2022-10-03 DIAGNOSIS — E11621 Type 2 diabetes mellitus with foot ulcer: Secondary | ICD-10-CM | POA: Diagnosis not present

## 2022-10-03 DIAGNOSIS — F4321 Adjustment disorder with depressed mood: Secondary | ICD-10-CM | POA: Diagnosis not present

## 2022-10-03 DIAGNOSIS — M25462 Effusion, left knee: Secondary | ICD-10-CM

## 2022-10-03 DIAGNOSIS — R197 Diarrhea, unspecified: Secondary | ICD-10-CM | POA: Diagnosis not present

## 2022-10-03 DIAGNOSIS — Z8041 Family history of malignant neoplasm of ovary: Secondary | ICD-10-CM

## 2022-10-03 DIAGNOSIS — N1832 Chronic kidney disease, stage 3b: Secondary | ICD-10-CM | POA: Diagnosis present

## 2022-10-03 DIAGNOSIS — Z888 Allergy status to other drugs, medicaments and biological substances status: Secondary | ICD-10-CM

## 2022-10-03 DIAGNOSIS — Z886 Allergy status to analgesic agent status: Secondary | ICD-10-CM

## 2022-10-03 DIAGNOSIS — E10649 Type 1 diabetes mellitus with hypoglycemia without coma: Secondary | ICD-10-CM | POA: Diagnosis present

## 2022-10-03 DIAGNOSIS — Z539 Procedure and treatment not carried out, unspecified reason: Secondary | ICD-10-CM | POA: Diagnosis present

## 2022-10-03 DIAGNOSIS — L0390 Cellulitis, unspecified: Secondary | ICD-10-CM | POA: Diagnosis not present

## 2022-10-03 DIAGNOSIS — E1051 Type 1 diabetes mellitus with diabetic peripheral angiopathy without gangrene: Secondary | ICD-10-CM | POA: Diagnosis present

## 2022-10-03 DIAGNOSIS — K219 Gastro-esophageal reflux disease without esophagitis: Secondary | ICD-10-CM | POA: Diagnosis present

## 2022-10-03 LAB — COMPREHENSIVE METABOLIC PANEL
ALT: 8 U/L (ref 0–44)
AST: 11 U/L — ABNORMAL LOW (ref 15–41)
Albumin: 2.1 g/dL — ABNORMAL LOW (ref 3.5–5.0)
Alkaline Phosphatase: 108 U/L (ref 38–126)
Anion gap: 9 (ref 5–15)
BUN: 31 mg/dL — ABNORMAL HIGH (ref 6–20)
CO2: 18 mmol/L — ABNORMAL LOW (ref 22–32)
Calcium: 8.2 mg/dL — ABNORMAL LOW (ref 8.9–10.3)
Chloride: 103 mmol/L (ref 98–111)
Creatinine, Ser: 1.64 mg/dL — ABNORMAL HIGH (ref 0.44–1.00)
GFR, Estimated: 43 mL/min — ABNORMAL LOW (ref >60)
Glucose, Bld: 246 mg/dL — ABNORMAL HIGH (ref 70–99)
Potassium: 4.2 mmol/L (ref 3.5–5.1)
Sodium: 130 mmol/L — ABNORMAL LOW (ref 135–145)
Total Bilirubin: 0.3 mg/dL (ref 0.3–1.2)
Total Protein: 6.2 g/dL — ABNORMAL LOW (ref 6.5–8.1)

## 2022-10-03 LAB — APTT: aPTT: 23 seconds — ABNORMAL LOW (ref 24–36)

## 2022-10-03 LAB — CBC WITH DIFFERENTIAL/PLATELET
Abs Immature Granulocytes: 0.46 10*3/uL — ABNORMAL HIGH (ref 0.00–0.07)
Basophils Absolute: 0.1 10*3/uL (ref 0.0–0.1)
Basophils Relative: 0 %
Eosinophils Absolute: 0 10*3/uL (ref 0.0–0.5)
Eosinophils Relative: 0 %
HCT: 20.2 % — ABNORMAL LOW (ref 36.0–46.0)
Hemoglobin: 6.4 g/dL — CL (ref 12.0–15.0)
Immature Granulocytes: 2 %
Lymphocytes Relative: 5 %
Lymphs Abs: 1.4 10*3/uL (ref 0.7–4.0)
MCH: 28.2 pg (ref 26.0–34.0)
MCHC: 31.7 g/dL (ref 30.0–36.0)
MCV: 89 fL (ref 80.0–100.0)
Monocytes Absolute: 1.5 10*3/uL — ABNORMAL HIGH (ref 0.1–1.0)
Monocytes Relative: 5 %
Neutro Abs: 27.8 10*3/uL — ABNORMAL HIGH (ref 1.7–7.7)
Neutrophils Relative %: 88 %
Platelets: 566 10*3/uL — ABNORMAL HIGH (ref 150–400)
RBC Morphology: NONE SEEN
RBC: 2.27 MIL/uL — ABNORMAL LOW (ref 3.87–5.11)
RDW: 14.9 % (ref 11.5–15.5)
WBC: 31.3 10*3/uL — ABNORMAL HIGH (ref 4.0–10.5)
nRBC: 0 % (ref 0.0–0.2)

## 2022-10-03 LAB — HEMOGLOBIN AND HEMATOCRIT, BLOOD
HCT: 19.6 % — ABNORMAL LOW (ref 36.0–46.0)
Hemoglobin: 6.3 g/dL — CL (ref 12.0–15.0)

## 2022-10-03 LAB — LACTIC ACID, PLASMA: Lactic Acid, Venous: 0.9 mmol/L (ref 0.5–1.9)

## 2022-10-03 LAB — SARS CORONAVIRUS 2 BY RT PCR: SARS Coronavirus 2 by RT PCR: NEGATIVE

## 2022-10-03 LAB — LIPASE, BLOOD: Lipase: 11 U/L (ref 11–51)

## 2022-10-03 LAB — OCCULT BLOOD X 1 CARD TO LAB, STOOL: Fecal Occult Bld: NEGATIVE

## 2022-10-03 LAB — PROTIME-INR
INR: 1.1 (ref 0.8–1.2)
Prothrombin Time: 14.6 seconds (ref 11.4–15.2)

## 2022-10-03 LAB — CBG MONITORING, ED: Glucose-Capillary: 222 mg/dL — ABNORMAL HIGH (ref 70–99)

## 2022-10-03 LAB — GLUCOSE, CAPILLARY: Glucose-Capillary: 226 mg/dL — ABNORMAL HIGH (ref 70–99)

## 2022-10-03 MED ORDER — METRONIDAZOLE 500 MG/100ML IV SOLN
500.0000 mg | Freq: Two times a day (BID) | INTRAVENOUS | Status: AC
  Administered 2022-10-03 – 2022-10-10 (×14): 500 mg via INTRAVENOUS
  Filled 2022-10-03 (×14): qty 100

## 2022-10-03 MED ORDER — VANCOMYCIN HCL 500 MG IV SOLR
500.0000 mg | Freq: Once | INTRAVENOUS | Status: AC
  Administered 2022-10-03: 500 mg via INTRAVENOUS

## 2022-10-03 MED ORDER — LACTATED RINGERS IV BOLUS (SEPSIS)
1000.0000 mL | Freq: Once | INTRAVENOUS | Status: AC
  Administered 2022-10-03: 1000 mL via INTRAVENOUS

## 2022-10-03 MED ORDER — INSULIN DETEMIR 100 UNIT/ML FLEXPEN: 16.0000 [IU] | PEN_INJECTOR | Freq: Two times a day (BID) | SUBCUTANEOUS | Status: DC

## 2022-10-03 MED ORDER — METOCLOPRAMIDE HCL 5 MG PO TABS
10.0000 mg | ORAL_TABLET | Freq: Three times a day (TID) | ORAL | Status: DC
  Administered 2022-10-04 (×3): 10 mg via ORAL
  Filled 2022-10-03 (×3): qty 2

## 2022-10-03 MED ORDER — SODIUM CHLORIDE 0.9 % IV SOLN
2.0000 g | Freq: Once | INTRAVENOUS | Status: AC
  Administered 2022-10-03: 2 g via INTRAVENOUS
  Filled 2022-10-03: qty 12.5

## 2022-10-03 MED ORDER — INSULIN DETEMIR 100 UNIT/ML ~~LOC~~ SOLN
16.0000 [IU] | Freq: Two times a day (BID) | SUBCUTANEOUS | Status: DC
  Administered 2022-10-03 – 2022-10-04 (×2): 16 [IU] via SUBCUTANEOUS
  Filled 2022-10-03 (×3): qty 0.16

## 2022-10-03 MED ORDER — ACETAMINOPHEN 500 MG PO TABS
1000.0000 mg | ORAL_TABLET | Freq: Once | ORAL | Status: AC
  Administered 2022-10-03: 1000 mg via ORAL
  Filled 2022-10-03: qty 2

## 2022-10-03 MED ORDER — FENTANYL CITRATE PF 50 MCG/ML IJ SOSY
50.0000 ug | PREFILLED_SYRINGE | Freq: Once | INTRAMUSCULAR | Status: AC
  Administered 2022-10-03: 50 ug via INTRAVENOUS
  Filled 2022-10-03: qty 1

## 2022-10-03 MED ORDER — INSULIN ASPART 100 UNIT/ML IJ SOLN: 0.0000 [IU] | Freq: Three times a day (TID) | INTRAMUSCULAR | Status: DC

## 2022-10-03 MED ORDER — LACTATED RINGERS IV SOLN: INTRAVENOUS | Status: AC

## 2022-10-03 MED ORDER — HYDROMORPHONE HCL 1 MG/ML IJ SOLN
0.5000 mg | INTRAMUSCULAR | Status: DC | PRN
  Administered 2022-10-03 – 2022-10-07 (×23): 1 mg via INTRAVENOUS
  Administered 2022-10-07: 0.5 mg via INTRAVENOUS
  Administered 2022-10-07: 1 mg via INTRAVENOUS
  Filled 2022-10-03 (×26): qty 1

## 2022-10-03 MED ORDER — INSULIN ASPART 100 UNIT/ML IJ SOLN: 0.0000 [IU] | INTRAMUSCULAR | Status: DC

## 2022-10-03 MED ORDER — FENTANYL CITRATE PF 50 MCG/ML IJ SOSY
25.0000 ug | PREFILLED_SYRINGE | Freq: Once | INTRAMUSCULAR | Status: AC
  Administered 2022-10-03: 25 ug via INTRAVENOUS
  Filled 2022-10-03: qty 1

## 2022-10-03 MED ORDER — IBUPROFEN 800 MG PO TABS
800.0000 mg | ORAL_TABLET | Freq: Once | ORAL | Status: AC
  Administered 2022-10-03: 800 mg via ORAL
  Filled 2022-10-03: qty 1

## 2022-10-03 MED ORDER — VANCOMYCIN HCL 1250 MG/250ML IV SOLN
1250.0000 mg | INTRAVENOUS | Status: DC
  Administered 2022-10-04 – 2022-10-07 (×4): 1250 mg via INTRAVENOUS
  Filled 2022-10-03 (×5): qty 250

## 2022-10-03 MED ORDER — SODIUM CHLORIDE 0.9 % IV SOLN
2.0000 g | Freq: Two times a day (BID) | INTRAVENOUS | Status: DC
  Administered 2022-10-03 – 2022-10-07 (×8): 2 g via INTRAVENOUS
  Filled 2022-10-03 (×8): qty 12.5

## 2022-10-03 MED ORDER — VANCOMYCIN HCL IN DEXTROSE 1-5 GM/200ML-% IV SOLN
1000.0000 mg | Freq: Once | INTRAVENOUS | Status: AC
  Administered 2022-10-03: 1000 mg via INTRAVENOUS
  Filled 2022-10-03: qty 200

## 2022-10-03 MED ORDER — SODIUM CHLORIDE 0.9% IV SOLUTION: Freq: Once | INTRAVENOUS | Status: DC

## 2022-10-03 NOTE — ED Notes (Signed)
Date and time results received: 10/03/22 2049 (use smartphrase ".now" to insert current time)  Test: hemoglobin Critical Value: 6.3  Name of Provider Notified: [email or web address]  Orders Received? Or Actions Taken?: Actions Taken: transfer pending

## 2022-10-03 NOTE — Assessment & Plan Note (Addendum)
Pt with sepsis due to cellulitis of BLE from diabetic foot ulcers (see photos). LE wound pathway Message sent to Dr. Logan Bores (pt is a TFA patient), reportedly EDP called and spoke with TFA earlier this evening as well MRI of B feet to look for evidence of osteomyelitis No subcutaneous air on plain film X rays, though there was concern for possible osteomyelitis unilaterally. Empiric cefepime / flagyl / vanc ABIs

## 2022-10-03 NOTE — Assessment & Plan Note (Addendum)
1u PRBC transfusion ordered for HGB < 7 which appears to be real and verified on repeat. No obvious stigmata of GIB, and Hemoccult today was negative. I suspect the cause of her anemia may be due to hemolysis associated with infection. Check LDH

## 2022-10-03 NOTE — Assessment & Plan Note (Addendum)
Noted history of dependence on hospital IV dilaudid in past associated with innumerable recurrent admits for diabetic gastroparesis. Today however, pt is not being admitted for chronic abd pain associated with diabetic gastroparesis, instead this patient clearly has an acute medical condition causing acute pain this admission (see photos of feet above). Therefore, will put pt on PRN IV dilaudid if needed this admission for severe pain.

## 2022-10-03 NOTE — Assessment & Plan Note (Signed)
Cont Levemir Sensitive SSI Q4H.

## 2022-10-03 NOTE — Assessment & Plan Note (Addendum)
Not flaring currently. Continue scheduled reglan 10mg  TID [facility]

## 2022-10-03 NOTE — Progress Notes (Signed)
32 year old female with uncontrolled diabetes followed by [facility] transferring from [facility] with sepsis secondary to right lower extremity cellulitis.  She has open wounds in both lower extremities she was seen by Dr. Ardelle Anton 4 days ago.  In the ER she was febrile at 102.5, tachycardic 115, lactic acid was normal blood pressure was 124/63.  She received vancomycin cefepime and Flagyl in the ER.  Her white count was 31,000.  X-ray of the foot did not show osteomyelitis.  Hemoglobin came back at 6.4 for unclear reason.  Stat CBC has been repeated which is pending.  She is not on any anticoagulation at home.  FOBT was negative in the ER.  So she is being admitted for sepsis cellulitis she probably will need MRI of her feet to rule out osteomyelitis with wounds in place.  ED NP is also put in a call to [facility].  I have accepted her to progressive bed inpatient.

## 2022-10-03 NOTE — ED Provider Notes (Signed)
[place] EMERGENCY DEPARTMENT AT [facility] Provider Note   CSN: [id number] Arrival date & time: 10/03/22  1244     History  Chief Complaint  Patient presents with   Leg Swelling    Kelli Hudson is a 32 y.o. female.  HPI   32 year old female presents emergency department with complaints of wounds on bilateral lower extremities, fever, lower extremity pain/swelling, redness, diarrhea.  Patient states that she has been dealing with lower extremity wounds for the past 2 weeks or so.  Has followed with her podiatrist in the outpatient setting at [facility] there which she recently finished a course of doxycycline which did not help her symptoms.  States that wounds initially appeared as blisters but then ruptured.  She subsequently noticed redness streaking up her legs.  Over the past few days, has noticed fever, chills, worsening lower extremity pain.  Denies any chest pain, shortness of breath, nausea, vomiting, urinary symptoms, hematochezia/melena.  Past medical history significant for DKA, intractable nausea and vomiting, GERD, gastroparesis, diabetes mellitus type 1   Home Medications Prior to Admission medications   Medication Sig Start Date End Date Taking? Authorizing Provider  atorvastatin (LIPITOR) 20 MG tablet Take 1 tablet (20 mg total) by mouth daily. 10/29/21   Loyola Mast, MD  carvedilol (COREG) 12.5 MG tablet Take 12.5 mg by mouth 2 (two) times daily. 06/12/22   [provider]  Continuous Blood Gluc Transmit (DEXCOM G6 TRANSMITTER) MISC Change every 90 days 11/29/21   Shamleffer, Konrad Dolores, MD  Continuous Glucose Sensor (DEXCOM G6 SENSOR) MISC 1 Device by Does not apply route as directed. 06/18/22   Shamleffer, Konrad Dolores, MD  Continuous Glucose Transmitter (DEXCOM G6 TRANSMITTER) MISC 1 Device by Does not apply route as directed. 06/18/22   Shamleffer, Konrad Dolores, MD  doxycycline (VIBRA-TABS) 100 MG tablet Take 1  tablet (100 mg total) by mouth 2 (two) times daily. 09/25/22   Vivi Barrack, DPM  etonogestrel (NEXPLANON) 68 MG IMPL implant 1 each by Subdermal route once.    [provider]  gabapentin (NEURONTIN) 300 MG capsule Take 1 capsule (300 mg total) by mouth 3 (three) times daily. 05/09/22 08/07/22  Burnadette Pop, MD  hydrOXYzine (VISTARIL) 25 MG capsule Take 1 capsule (25 mg total) by mouth every 8 (eight) hours as needed. Patient taking differently: Take 25 mg by mouth every 8 (eight) hours as needed for anxiety. 07/31/21   Alwyn Ren, MD  insulin detemir (LEVEMIR FLEXTOUCH) 100 UNIT/ML FlexPen Inject 16 Units into the skin 2 times daily. 06/18/22   Shamleffer, Konrad Dolores, MD  Insulin Pen Needle 32G X 4 MM MISC 1 Device by Does not apply route in the morning, at noon, in the evening, and at bedtime. 06/18/22   Shamleffer, Konrad Dolores, MD  Insulin Regular Human (NOVOLIN R FLEXPEN RELION) 100 UNIT/ML KwikPen Max daily 15 units 06/18/22   Shamleffer, Konrad Dolores, MD  losartan (COZAAR) 25 MG tablet Take 1 tablet (25 mg total) by mouth daily. 05/09/22   Burnadette Pop, MD  metoCLOPramide (REGLAN) 10 MG tablet Take 1 tablet (10 mg) by mouth every 8 hours as needed for nausea. 05/09/22 05/09/23  Burnadette Pop, MD  ondansetron (ZOFRAN) 4 MG tablet Take 1 tablet (4 mg total) by mouth every 8 (eight) hours as needed for nausea or vomiting. 04/21/22   Dorcas Carrow, MD  pantoprazole (PROTONIX) 40 MG tablet Take 1 tablet (40 mg total) by mouth 2 (two) times daily before  a meal. 11/08/21   Kathlen Mody, MD  promethazine (PHENERGAN) 25 MG suppository Place 1 suppository (25 mg total) rectally every 6 (six) hours as needed for nausea or vomiting. Patient not taking: Reported on 06/18/2022 04/21/22   Dorcas Carrow, MD  silver sulfADIAZINE (SILVADENE) 1 % cream Apply 1 Application topically daily. 09/25/22   Vivi Barrack, DPM  insulin aspart (NOVOLOG) 100 UNIT/ML FlexPen Inject 5 Units into  the skin 3 (three) times daily with meals. 02/23/18 07/05/19  Jerald Kief, MD      Allergies    Lisinopril    Review of Systems   Review of Systems  All other systems reviewed and are negative.   Physical Exam Updated Vital Signs BP 124/63   Pulse 95   Temp 98.6 F (37 C) (Oral)   Resp 19   SpO2 100%  Physical Exam Vitals and nursing note reviewed. Exam conducted with a chaperone present.  Constitutional:      General: She is not in acute distress.    Appearance: She is well-developed. She is ill-appearing.  HENT:     Head: Normocephalic and atraumatic.  Eyes:     Conjunctiva/sclera: Conjunctivae normal.  Cardiovascular:     Rate and Rhythm: Regular rhythm. Tachycardia present.  Pulmonary:     Effort: Pulmonary effort is normal. No respiratory distress.     Breath sounds: Normal breath sounds. No wheezing, rhonchi or rales.  Abdominal:     Palpations: Abdomen is soft.     Tenderness: There is abdominal tenderness.     Comments: Diffuse tenderness on abdominal exam.  Genitourinary:    Comments: Patient with small amounts of clay colored stool without evidence of rectal bleeding or melena on exam. Musculoskeletal:     Cervical back: Neck supple.     Comments: See pictures below regarding patient's lower extremities.  Wounds noted on the sole of feet.  Surrounding erythema which tracks up lower extremities bilaterally.  Lower extremity swelling with 1-2+ pitting edema.  Patient with palpable pedal pulses 1-2+ bilaterally.  Diffuse tenderness to palpation from about mid shin distally right greater than left.  Skin:    General: Skin is warm and dry.     Capillary Refill: Capillary refill takes less than 2 seconds.  Neurological:     Mental Status: She is alert.  Psychiatric:        Mood and Affect: Mood normal.             ED Results / Procedures / Treatments   Labs (all labs ordered are listed, but only abnormal results are displayed) Labs Reviewed   COMPREHENSIVE METABOLIC PANEL - Abnormal; Notable for the following components:      Result Value   Sodium 130 (*)    CO2 18 (*)    Glucose, Bld 246 (*)    BUN 31 (*)    Creatinine, Ser 1.64 (*)    Calcium 8.2 (*)    Total Protein 6.2 (*)    Albumin 2.1 (*)    AST 11 (*)    GFR, Estimated 43 (*)    All other components within normal limits  CBC WITH DIFFERENTIAL/PLATELET - Abnormal; Notable for the following components:   WBC 31.3 (*)    RBC 2.27 (*)    Hemoglobin 6.4 (*)    HCT 20.2 (*)    Platelets 566 (*)    Neutro Abs 27.8 (*)    Monocytes Absolute 1.5 (*)    Abs Immature Granulocytes 0.46 (*)  All other components within normal limits  APTT - Abnormal; Notable for the following components:   aPTT 23 (*)    All other components within normal limits  CBG MONITORING, ED - Abnormal; Notable for the following components:   Glucose-Capillary 222 (*)    All other components within normal limits  SARS CORONAVIRUS 2 BY RT PCR  CULTURE, BLOOD (ROUTINE X 2)  CULTURE, BLOOD (ROUTINE X 2)  LACTIC ACID, PLASMA  PROTIME-INR  LIPASE, BLOOD  OCCULT BLOOD X 1 CARD TO LAB, STOOL  LACTIC ACID, PLASMA  URINALYSIS, W/ REFLEX TO CULTURE (INFECTION SUSPECTED)  PREGNANCY, URINE  CBC WITH DIFFERENTIAL/PLATELET    EKG None  Radiology DG Foot Complete Left  Result Date: 10/03/2022 CLINICAL DATA:  Bilateral foot pain EXAM: LEFT FOOT - COMPLETE 3+ VIEW COMPARISON:  09/25/2022, 11/12/2021 FINDINGS: No acute fracture or dislocation. No aggressive osseous lesion. Normal alignment. 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 Amputation of the fourth proximal,, middle and distal phalanx. Soft tissue are unremarkable. No radiopaque foreign body or soft tissue emphysema. Peripheral vascular atherosclerotic disease. IMPRESSION: 1.  No acute osseous injury of the left foot. Electronically Signed   By: Elige Ko M.D.   On: 10/03/2022 16:17   DG Foot Complete  Right  Result Date: 10/03/2022 CLINICAL DATA:  Right foot pain EXAM: RIGHT FOOT COMPLETE - 3+ VIEW COMPARISON:  09/25/2022, 09/26/2017 FINDINGS: No acute fracture or dislocation. No aggressive osseous lesion. Normal alignment. Marginal erosion along the medial aspect of the second, third and fourth metatarsal heads as can be seen with an inflammatory or crystalline arthropathy. 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 Soft tissue edema along the dorsal aspect of the foot and around the distal lower leg which may be reactive versus secondary to cellulitis. No radiopaque foreign body or soft tissue emphysema. IMPRESSION: 1.  No acute osseous injury of the right foot. 2. Marginal erosion along the medial aspect of the second, third and fourth metatarsal heads as can be seen with an inflammatory or crystalline arthropathy. Electronically Signed   By: Elige Ko M.D.   On: 10/03/2022 16:15   DG Chest 2 View  Result Date: 10/03/2022 CLINICAL DATA:  Suspected Sepsis EXAM: CHEST - 2 VIEW COMPARISON:  None Available. FINDINGS: No pleural effusion. No pneumothorax. No focal airspace opacity. Normal cardiac and mediastinal contours. No radiographically displaced rib fractures. Visualized upper abdomen is possible. Vertebral body heights are maintained. IMPRESSION: No focal airspace opacity Electronically Signed   By: Lorenza Cambridge M.D.   On: 10/03/2022 14:13    Procedures .Critical Care  Performed by: Peter Garter, PA Authorized by: Peter Garter, PA   Critical care provider statement:    Critical care time (minutes):  58   Critical care was necessary to treat or prevent imminent or life-threatening deterioration of the following conditions:  Sepsis   Critical care was time spent personally by me on the following activities:  Development of treatment plan with patient or surrogate, discussions with consultants, evaluation of patient's response to treatment, examination of patient, ordering and  review of laboratory studies, ordering and review of radiographic studies, ordering and performing treatments and interventions, pulse oximetry, re-evaluation of patient's condition and review of old charts   I assumed direction of critical care for this patient from another provider in my specialty: no     Care discussed with: admitting provider       Medications Ordered in ED Medications  lactated ringers infusion ( Intravenous New Bag/Given 10/03/22 1720)  metroNIDAZOLE (FLAGYL) IVPB 500 mg (500 mg Intravenous New Bag/Given 10/03/22 1556)  vancomycin (VANCOCIN) IVPB 1000 mg/200 mL premix (1,000 mg Intravenous New Bag/Given 10/03/22 1722)  vancomycin (VANCOCIN) 500 mg in sodium chloride 0.9 % 100 mL IVPB (has no administration in time range)  acetaminophen (TYLENOL) tablet 1,000 mg (1,000 mg Oral Given 10/03/22 1300)  lactated ringers bolus 1,000 mL (1,000 mLs Intravenous New Bag/Given 10/03/22 1454)  ceFEPIme (MAXIPIME) 2 g in sodium chloride 0.9 % 100 mL IVPB (2 g Intravenous New Bag/Given 10/03/22 1502)  ibuprofen (ADVIL) tablet 800 mg (800 mg Oral Given 10/03/22 1501)  fentaNYL (SUBLIMAZE) injection 25 mcg (25 mcg Intravenous Given 10/03/22 1455)    ED Course/ Medical Decision Making/ A&P Clinical Course as of 10/03/22 1803  Fri Oct 03, 2022  1701 Consulted hospitalist Dr. Ashley Royalty who recommended consulting [facility] as well as agreed with admission and assume further treatment/care. [CR]    Clinical Course User Index [CR] Peter Garter, PA                                 Medical Decision Making Amount and/or Complexity of Data Reviewed Labs: ordered. Radiology: ordered. ECG/medicine tests: ordered.  Risk OTC drugs. Prescription drug management. Decision regarding hospitalization.   This patient presents to the ED for concern of lower extremity wounds, fever, chills,, this involves an extensive number of treatment options, and is a complaint that carries with it  a high risk of complications and morbidity.  The differential diagnosis includes, cellulitis, osteomyelitis, sepsis   Co morbidities that complicate the patient evaluation  See HPI   Additional history obtained:  Additional history obtained from EMR External records from outside source obtained and reviewed including hospital records   Lab Tests:  I Ordered, and personally interpreted labs.  The pertinent results include: Leukocytosis of 31.3 with left shift.  Evidence of anemia with hemoglobin of 6.40 which is normocytic in nature.  Thrombocytosis of 566.  Hyponatremia of 130, decreased bicarb of 18, hypocalcemia of 8.2 but otherwise, electrolytes within normal limits.  Patient with evidence of what seems like baseline renal dysfunction with creatinine 1.64, BUN of 31 and GFR 43.  No transaminitis.  aPTT decrease of 23.  PT/INR within normal limits.  Initial lactic of 0.9.  Lipase within normal limits.  COVID-negative.  Occult negative.   Imaging Studies ordered:  I ordered imaging studies including left foot x-ray, right foot x-ray, chest x-ray I independently visualized and interpreted imaging which showed  Chest x-ray: No acute cardiopulmonary abnormalities Left foot x-ray: No acute osseous abnormality Right foot x-ray: No acute osseous abnormality.  Marginal erosion along medial aspect of second third and fourth metatarsal heads. I agree with the radiologist interpretation  Cardiac Monitoring: / EKG:  The patient was maintained on a cardiac monitor.  I personally viewed and interpreted the cardiac monitored which showed an underlying rhythm of: Sinus tachycardia   Consultations Obtained:  See ED course  Problem List / ED Course / Critical interventions / Medication management  Sepsis, cellulitis, anemia I ordered medication including vancomycin, Flagyl, cefepime, Motrin, Tylenol, lactated Ringer's   Reevaluation of the patient after these medicines showed that the  patient improved I have reviewed the patients home medicines and have made adjustments as needed   Social Determinants of Health:  Denies tobacco, illicit drug use  Test / Admission - Considered:  Sepsis, cellulitis, anemia Vitals signs significant for initially of oral normal temp of 102.5, tachycardic with a heart rate of 115 of which decreased with time elapsed and medicines administered while in the emergency department. Otherwise within normal range and stable throughout visit. Laboratory/imaging studies significant for: See above 32 year old female presents emergency department with complaints of bilateral lower extremity wounds, redness, swelling of bilateral lower extremities, fever, chills.  On exam initially, patient with open wounds as well as cellulitic skin changes of bilateral lower extremities with right greater than left.  Vital signs initially significant for tachycardia with a rate of 115, pyrexia with normal temp 102.5; given evidence of wounds, sepsis protocol was performed/follow-up.  X-rays were obtained which did not show any signs of overt osteomyelitis and no clinical ability to probe to bone at this time but given diffuse nature of wounds, some concern for possible underlying osteomyelitis.  Patient begun on broad-spectrum antibiotics in the form of Flagyl, cefepime, vancomycin per antibiotic guideline.  Regarding anemia, patient without any menstrual periods, reported episodes of dark or bloody stools with negative Hemoccult and reassuring rectal exam.  Patient without evidence of pallor, lightheadedness, fatigue, dizziness or other sign concerning for symptomatic anemia.  Assessing CBC again to see if hemoglobin of 6.4 is truly legitimate.  Given evidence of meeting sepsis criteria as well as initial anemia with hemoglobin 6.4, patient deemed to meet admission criteria. Treatment plan were discussed at length with patient and they knowledge understanding was agreeable to  said plan.  Appropriate consultations were made as described in the ED course.  Patient was stable upon admission to the hospital.         Final Clinical Impression(s) / ED Diagnoses Final diagnoses:  Anemia, unspecified type  Sepsis, due to unspecified organism, unspecified whether acute organ dysfunction present [facility])  Cellulitis of lower extremity, unspecified laterality    Rx / DC Orders ED Discharge Orders     None         Peter Garter, [place] 10/03/22 1803    Virgina Norfolk, DO 10/04/22 0701

## 2022-10-03 NOTE — Progress Notes (Signed)
Pharmacy Antibiotic Note  Kelli Hudson is a 32 y.o. female admitted on 10/03/2022 with  suspected diabetic foot infection .  Pharmacy has been consulted for Cefepime + Vancomycin dosing.  Plan: Cefepime 2gm IV q12h Vancomycin 1250mg  IV q24h to target AUC 400-550.  Estimated AUC on this regimen = 460. Monitor renal function and cx data      Temp (24hrs), Avg:99.6 F (37.6 C), Min:97.8 F (36.6 C), Max:102.5 F (39.2 C)  Recent Labs  Lab 10/03/22 1354  WBC 31.3*  CREATININE 1.64*  LATICACIDVEN 0.9    CrCl cannot be calculated (Unknown ideal weight.).    Allergies  Allergen Reactions   Lisinopril Cough, Other (See Comments), Itching and Rash    Breakout in rash    Antimicrobials this admission: 8/16 Cefepime >>  8/16 Vancomycin >>  8/16 Metronidazole >>  Dose adjustments this admission:  Microbiology results: 8/16 BCx:  8/16 Resp PCR: negative  Thank you for allowing pharmacy to be a part of this patient's care.  Junita Push PharmD 10/03/2022 10:57 PM

## 2022-10-03 NOTE — Progress Notes (Signed)
Patient arrived via transport from [place]. Report received from [place], [place]. Patient A&Ox4, reports pain 6/10 bilateral legs. VSS. Admissions notified of patient's arrival.

## 2022-10-03 NOTE — ED Triage Notes (Signed)
Pt presents to ED POV. Pt c/o diarrhea x2w, protein in urine, pcp reports arrhythmia w/ auscultation, and LE swelling. Sent here from PCP to check for infection and get EKG

## 2022-10-03 NOTE — Sepsis Progress Note (Signed)
Code Sepsis protocol being monitored by eLink. 

## 2022-10-03 NOTE — Progress Notes (Signed)
Pharmacy Antibiotic Note  Kelli Hudson is a 32 y.o. female admitted on 10/03/2022 with concern for diabetic foot infection.  Pharmacy has been consulted for cefepime and vancomycin dosing.  Plan: Vancomycin 1500mg  load x 1 in ED  Cefepime 2g x 1 in ED F/u additional doses on transfer  F/u micro data, clinical work-up and narrow antibiotics as able    Temp (24hrs), Avg:102.5 F (39.2 C), Min:102.5 F (39.2 C), Max:102.5 F (39.2 C)  No results for input(s): "WBC", "CREATININE", "LATICACIDVEN", "VANCOTROUGH", "VANCOPEAK", "VANCORANDOM", "GENTTROUGH", "GENTPEAK", "GENTRANDOM", "TOBRATROUGH", "TOBRAPEAK", "TOBRARND", "AMIKACINPEAK", "AMIKACINTROU", "AMIKACIN" in the last 168 hours.  CrCl cannot be calculated (Patient's most recent lab result is older than the maximum 21 days allowed.).    Allergies  Allergen Reactions   Lisinopril Cough, Other (See Comments), Itching and Rash    Breakout in rash    Antimicrobials this admission: Cefepime 8/16 > Vancomycin 8/16 >  Dose adjustments this admission:  Microbiology results: 8/16 resp panel: sent 8/16 Bcx: sent  Thank you for allowing pharmacy to be a part of this patient's care.  Marja Kays 10/03/2022 2:37 PM

## 2022-10-03 NOTE — Progress Notes (Signed)
ED Pharmacy Antibiotic Sign Off An antibiotic consult was received from an ED provider for [facility] per pharmacy dosing for vancomycin and cefepime. A chart review was completed to assess appropriateness.   The following one time order(s) were placed:  Cefepime 2g IV x 1 Vancomycin 1500 mg IV x 1  Further antibiotic and/or antibiotic pharmacy consults should be ordered by the admitting provider if indicated.   Thank you for allowing pharmacy to be a part of this patient's care.   Daylene Posey, [facility]  Clinical Pharmacist 10/03/22 4:31 PM

## 2022-10-04 ENCOUNTER — Inpatient Hospital Stay (HOSPITAL_COMMUNITY): Payer: Medicaid Other

## 2022-10-04 DIAGNOSIS — A419 Sepsis, unspecified organism: Principal | ICD-10-CM

## 2022-10-04 DIAGNOSIS — L97909 Non-pressure chronic ulcer of unspecified part of unspecified lower leg with unspecified severity: Secondary | ICD-10-CM

## 2022-10-04 DIAGNOSIS — N179 Acute kidney failure, unspecified: Secondary | ICD-10-CM | POA: Diagnosis not present

## 2022-10-04 DIAGNOSIS — L0390 Cellulitis, unspecified: Secondary | ICD-10-CM | POA: Diagnosis not present

## 2022-10-04 DIAGNOSIS — L03116 Cellulitis of left lower limb: Secondary | ICD-10-CM

## 2022-10-04 DIAGNOSIS — E1022 Type 1 diabetes mellitus with diabetic chronic kidney disease: Secondary | ICD-10-CM

## 2022-10-04 DIAGNOSIS — E1043 Type 1 diabetes mellitus with diabetic autonomic (poly)neuropathy: Secondary | ICD-10-CM

## 2022-10-04 DIAGNOSIS — Z765 Malingerer [conscious simulation]: Secondary | ICD-10-CM

## 2022-10-04 DIAGNOSIS — N182 Chronic kidney disease, stage 2 (mild): Secondary | ICD-10-CM

## 2022-10-04 DIAGNOSIS — K3184 Gastroparesis: Secondary | ICD-10-CM

## 2022-10-04 DIAGNOSIS — D649 Anemia, unspecified: Secondary | ICD-10-CM | POA: Diagnosis not present

## 2022-10-04 DIAGNOSIS — L03115 Cellulitis of right lower limb: Secondary | ICD-10-CM

## 2022-10-04 LAB — CBC
HCT: 21.6 % — ABNORMAL LOW (ref 36.0–46.0)
Hemoglobin: 6.8 g/dL — CL (ref 12.0–15.0)
MCH: 29.6 pg (ref 26.0–34.0)
MCHC: 31.5 g/dL (ref 30.0–36.0)
MCV: 93.9 fL (ref 80.0–100.0)
Platelets: 724 10*3/uL — ABNORMAL HIGH (ref 150–400)
RBC: 2.3 MIL/uL — ABNORMAL LOW (ref 3.87–5.11)
RDW: 14.7 % (ref 11.5–15.5)
WBC: 26.9 10*3/uL — ABNORMAL HIGH (ref 4.0–10.5)
nRBC: 0 % (ref 0.0–0.2)

## 2022-10-04 LAB — GLUCOSE, CAPILLARY
Glucose-Capillary: 102 mg/dL — ABNORMAL HIGH (ref 70–99)
Glucose-Capillary: 105 mg/dL — ABNORMAL HIGH (ref 70–99)
Glucose-Capillary: 122 mg/dL — ABNORMAL HIGH (ref 70–99)
Glucose-Capillary: 150 mg/dL — ABNORMAL HIGH (ref 70–99)
Glucose-Capillary: 38 mg/dL — CL (ref 70–99)
Glucose-Capillary: 41 mg/dL — CL (ref 70–99)
Glucose-Capillary: 46 mg/dL — ABNORMAL LOW (ref 70–99)
Glucose-Capillary: 48 mg/dL — ABNORMAL LOW (ref 70–99)
Glucose-Capillary: 59 mg/dL — ABNORMAL LOW (ref 70–99)
Glucose-Capillary: 59 mg/dL — ABNORMAL LOW (ref 70–99)
Glucose-Capillary: 63 mg/dL — ABNORMAL LOW (ref 70–99)
Glucose-Capillary: 77 mg/dL (ref 70–99)
Glucose-Capillary: 82 mg/dL (ref 70–99)

## 2022-10-04 LAB — BASIC METABOLIC PANEL
Anion gap: 8 (ref 5–15)
BUN: 31 mg/dL — ABNORMAL HIGH (ref 6–20)
CO2: 19 mmol/L — ABNORMAL LOW (ref 22–32)
Calcium: 8.3 mg/dL — ABNORMAL LOW (ref 8.9–10.3)
Chloride: 106 mmol/L (ref 98–111)
Creatinine, Ser: 1.76 mg/dL — ABNORMAL HIGH (ref 0.44–1.00)
GFR, Estimated: 39 mL/min — ABNORMAL LOW (ref >60)
Glucose, Bld: 160 mg/dL — ABNORMAL HIGH (ref 70–99)
Potassium: 3.8 mmol/L (ref 3.5–5.1)
Sodium: 133 mmol/L — ABNORMAL LOW (ref 135–145)

## 2022-10-04 LAB — PREPARE RBC (CROSSMATCH)

## 2022-10-04 LAB — HEMOGLOBIN AND HEMATOCRIT, BLOOD
HCT: 30.5 % — ABNORMAL LOW (ref 36.0–46.0)
Hemoglobin: 9.1 g/dL — ABNORMAL LOW (ref 12.0–15.0)

## 2022-10-04 LAB — VAS US ABI WITH/WO TBI
Left ABI: 1.13
Right ABI: 1.21

## 2022-10-04 LAB — PREALBUMIN: Prealbumin: <5 mg/dL — ABNORMAL LOW (ref 18–38)

## 2022-10-04 LAB — LACTATE DEHYDROGENASE: LDH: 151 U/L (ref 98–192)

## 2022-10-04 LAB — C-REACTIVE PROTEIN: CRP: 16.4 mg/dL — ABNORMAL HIGH (ref <1.0)

## 2022-10-04 LAB — SEDIMENTATION RATE: Sed Rate: 128 mm/hr — ABNORMAL HIGH (ref 0–22)

## 2022-10-04 MED ORDER — GLUCAGON HCL RDNA (DIAGNOSTIC) 1 MG IJ SOLR
1.0000 mg | INTRAMUSCULAR | Status: AC
  Administered 2022-10-04: 1 mg via INTRAMUSCULAR
  Filled 2022-10-04: qty 1

## 2022-10-04 MED ORDER — ACETAMINOPHEN 650 MG RE SUPP: 650.0000 mg | Freq: Four times a day (QID) | RECTAL | Status: DC | PRN

## 2022-10-04 MED ORDER — GLUCOSE 40 % PO GEL
ORAL | Status: AC
  Administered 2022-10-04: 31 g via ORAL
  Filled 2022-10-04: qty 1

## 2022-10-04 MED ORDER — ENOXAPARIN SODIUM 40 MG/0.4ML IJ SOSY: 40.0000 mg | PREFILLED_SYRINGE | INTRAMUSCULAR | Status: DC

## 2022-10-04 MED ORDER — SODIUM CHLORIDE 0.9 % IV SOLN: INTRAVENOUS | Status: DC

## 2022-10-04 MED ORDER — SILVER SULFADIAZINE 1 % EX CREA
TOPICAL_CREAM | Freq: Every day | CUTANEOUS | Status: DC
  Filled 2022-10-04: qty 50

## 2022-10-04 MED ORDER — DEXTROSE-SODIUM CHLORIDE 5-0.9 % IV SOLN: INTRAVENOUS | Status: DC

## 2022-10-04 MED ORDER — GLUCOSE 40 % PO GEL: 2.0000 | ORAL | Status: AC

## 2022-10-04 MED ORDER — DEXTROSE 50 % IV SOLN: 25.0000 g | INTRAVENOUS | Status: AC

## 2022-10-04 MED ORDER — DEXTROSE 50 % IV SOLN
INTRAVENOUS | Status: AC
  Administered 2022-10-04: 50 mL
  Filled 2022-10-04: qty 50

## 2022-10-04 MED ORDER — INSULIN ASPART 100 UNIT/ML IJ SOLN
0.0000 [IU] | Freq: Three times a day (TID) | INTRAMUSCULAR | Status: DC
  Administered 2022-10-06 (×2): 2 [IU] via SUBCUTANEOUS
  Administered 2022-10-06 – 2022-10-08 (×3): 1 [IU] via SUBCUTANEOUS

## 2022-10-04 MED ORDER — CHLORHEXIDINE GLUCONATE CLOTH 2 % EX PADS
6.0000 | MEDICATED_PAD | Freq: Every day | CUTANEOUS | Status: DC
  Administered 2022-10-05 – 2022-11-07 (×30): 6 via TOPICAL

## 2022-10-04 MED ORDER — ONDANSETRON HCL 4 MG/2ML IJ SOLN
4.0000 mg | Freq: Four times a day (QID) | INTRAMUSCULAR | Status: DC | PRN
  Administered 2022-10-04 – 2022-10-07 (×5): 4 mg via INTRAVENOUS
  Filled 2022-10-04 (×5): qty 2

## 2022-10-04 MED ORDER — DEXTROSE 50 % IV SOLN
INTRAVENOUS | Status: AC
  Administered 2022-10-04: 25 g via INTRAVENOUS
  Filled 2022-10-04: qty 50

## 2022-10-04 MED ORDER — ACETAMINOPHEN 325 MG PO TABS
650.0000 mg | ORAL_TABLET | Freq: Four times a day (QID) | ORAL | Status: DC | PRN
  Administered 2022-10-04 – 2022-10-08 (×5): 650 mg via ORAL
  Filled 2022-10-04 (×5): qty 2

## 2022-10-04 NOTE — Progress Notes (Signed)
Hypoglycemic Event  CBG: 59  Treatment: 8 oz juice/soda and 1 tube glucose gel  Symptoms: Shaky, Hungry, and Vision changes  Follow-up CBG: Time:1803 CBG Result:105  Possible Reasons for Event: Inadequate meal intake  Comments/MD notified:hypoglycemic protocol    Curley Spice

## 2022-10-04 NOTE — H&P (Signed)
History and Physical    Patient: Kelli Hudson NWG:[id number] DOB: [birth year] DOA: 10/03/2022 DOS: the patient was seen and examined on 10/04/2022 PCP: Norm Salt, PA  Patient coming from: Home  Chief Complaint:  Chief Complaint  Patient presents with   Leg Swelling   HPI: Kelli Hudson is a 32 y.o. female with medical history significant of DM1, diabetic gastroparesis with very frequent admits in the past, HTN.  Today, she presents to ED at [facility] with c/o B foot wounds, fever, and pain.  Patient states that she has been dealing with lower extremity wounds for the past 2 weeks or so. Has followed with her podiatrist in the outpatient setting at [facility] there which she recently finished a course of doxycycline which did not help her symptoms. States that wounds initially appeared as blisters but then ruptured. She subsequently noticed redness streaking up her legs. Over the past few days, has noticed fever, chills, worsening lower extremity pain. Denies any chest pain, shortness of breath, nausea, vomiting, urinary symptoms, hematochezia/melena.  Review of Systems: As mentioned in the history of present illness. All other systems reviewed and are negative. Past Medical History:  Diagnosis Date   Acute H. pylori gastric ulcer    Coffee ground emesis    Diabetes mellitus (HCC)    Diabetic gastroparesis (HCC)    DKA (diabetic ketoacidosis) (HCC) 02/24/2021   Gastroparesis    GERD (gastroesophageal reflux disease)    Hypertension    Hyperthyroidism    Intractable nausea and vomiting 04/20/2021   Normocytic anemia 04/20/2020   Prolonged Q-T interval on ECG    Past Surgical History:  Procedure Laterality Date   AMPUTATION TOE Left 03/10/2018   Procedure: AMPUTATION FIFTH TOE;  Surgeon: Vivi Barrack, DPM;  Location: [facility];  Service: Podiatry;  Laterality: Left;   BIOPSY  01/28/2020   Procedure: BIOPSY;  Surgeon: Kathi Der, MD;  Location: [facility];  Service: Gastroenterology;;   BOTOX INJECTION  08/26/2021   Procedure: BOTOX INJECTION;  Surgeon: Sherrilyn Rist, MD;  Location: [facility];  Service: Gastroenterology;;   ESOPHAGOGASTRODUODENOSCOPY N/A 01/28/2020   Procedure: ESOPHAGOGASTRODUODENOSCOPY (EGD);  Surgeon: Kathi Der, MD;  Location: [facility];  Service: Gastroenterology;  Laterality: N/A;   ESOPHAGOGASTRODUODENOSCOPY (EGD) WITH PROPOFOL Left 09/08/2015   Procedure: ESOPHAGOGASTRODUODENOSCOPY (EGD) WITH PROPOFOL;  Surgeon: Willis Modena, MD;  Location: [facility];  Service: Endoscopy;  Laterality: Left;   ESOPHAGOGASTRODUODENOSCOPY (EGD) WITH PROPOFOL N/A 08/26/2021   Procedure: ESOPHAGOGASTRODUODENOSCOPY (EGD) WITH PROPOFOL;  Surgeon: Sherrilyn Rist, MD;  Location: [facility];  Service: Gastroenterology;  Laterality: N/A;   WISDOM TOOTH EXTRACTION     Social History:  reports that she has never smoked. She has never used smokeless tobacco. She reports current alcohol use of about 1.0 standard drink of alcohol per week. She reports that she does not use drugs.  Allergies  Allergen Reactions   Lisinopril Cough, Other (See Comments), Itching and Rash    Breakout in rash    Family History  Problem Relation Age of Onset   Lung cancer Mother    Diabetes Mother    Bipolar disorder Father    Liver cancer Maternal Grandfather    Diabetes Maternal Aunt        x 2   Pancreatic cancer Maternal Uncle    Prostate cancer Maternal Uncle    Breast cancer Other        maternal great aunt   Heart disease Other  Ovarian cancer Other        maternal great aunt   Kidney disease Other        maternal great aunt   Colon cancer Neg Hx    Stomach cancer Neg Hx     Prior to Admission medications   Medication Sig Start Date End Date Taking? Authorizing Provider  atorvastatin (LIPITOR) 20 MG tablet Take 1 tablet (20 mg total) by mouth daily. 10/29/21   Loyola Mast, MD   carvedilol (COREG) 12.5 MG tablet Take 12.5 mg by mouth 2 (two) times daily. 06/12/22   [provider]  Continuous Blood Gluc Transmit (DEXCOM G6 TRANSMITTER) MISC Change every 90 days 11/29/21   Shamleffer, Konrad Dolores, MD  Continuous Glucose Sensor (DEXCOM G6 SENSOR) MISC 1 Device by Does not apply route as directed. 06/18/22   Shamleffer, Konrad Dolores, MD  Continuous Glucose Transmitter (DEXCOM G6 TRANSMITTER) MISC 1 Device by Does not apply route as directed. 06/18/22   Shamleffer, Konrad Dolores, MD  doxycycline (VIBRA-TABS) 100 MG tablet Take 1 tablet (100 mg total) by mouth 2 (two) times daily. 09/25/22   Vivi Barrack, DPM  etonogestrel (NEXPLANON) 68 MG IMPL implant 1 each by Subdermal route once.    [provider]  gabapentin (NEURONTIN) 300 MG capsule Take 1 capsule (300 mg total) by mouth 3 (three) times daily. 05/09/22 08/07/22  Burnadette Pop, MD  insulin detemir (LEVEMIR FLEXTOUCH) 100 UNIT/ML FlexPen Inject 16 Units into the skin 2 times daily. 06/18/22   Shamleffer, Konrad Dolores, MD  Insulin Pen Needle 32G X 4 MM MISC 1 Device by Does not apply route in the morning, at noon, in the evening, and at bedtime. 06/18/22   Shamleffer, Konrad Dolores, MD  Insulin Regular Human (NOVOLIN R FLEXPEN RELION) 100 UNIT/ML KwikPen Max daily 15 units 06/18/22   Shamleffer, Konrad Dolores, MD  losartan (COZAAR) 25 MG tablet Take 1 tablet (25 mg total) by mouth daily. 05/09/22   Burnadette Pop, MD  metoCLOPramide (REGLAN) 10 MG tablet Take 1 tablet (10 mg) by mouth every 8 hours as needed for nausea. 05/09/22 05/09/23  Burnadette Pop, MD  ondansetron (ZOFRAN) 4 MG tablet Take 1 tablet (4 mg total) by mouth every 8 (eight) hours as needed for nausea or vomiting. 04/21/22   Dorcas Carrow, MD  pantoprazole (PROTONIX) 40 MG tablet Take 1 tablet (40 mg total) by mouth 2 (two) times daily before a meal. 11/08/21   Kathlen Mody, MD  silver sulfADIAZINE (SILVADENE) 1 % cream Apply 1  Application topically daily. 09/25/22   Vivi Barrack, DPM  insulin aspart (NOVOLOG) 100 UNIT/ML FlexPen Inject 5 Units into the skin 3 (three) times daily with meals. 02/23/18 07/05/19  Jerald Kief, MD    Physical Exam: Vitals:   10/03/22 2045 10/03/22 2100 10/03/22 2201 10/03/22 2301  BP: 118/65 (!) 102/50 115/75   Pulse: 86 81 82   Resp: 11 13 16    Temp:   97.8 F (36.6 C)   TempSrc:   Oral   SpO2: 97% 98% 100%   Weight:    84.8 kg  Height:    5\' 7"  (1.702 m)   Constitutional: NAD, calm, comfortable Respiratory: clear to auscultation bilaterally, no wheezing, no crackles. Normal respiratory effort. No accessory muscle use.  Cardiovascular: Regular rate and rhythm, no murmurs / rubs / gallops. No extremity edema. 2+ pedal pulses. No carotid bruits.  Abdomen: no tenderness, no masses palpated. No hepatosplenomegaly. Bowel sounds positive.  Skin:  Neurologic: CN 2-12 grossly intact. Sensation intact, DTR normal. Strength 5/5 in all 4.  Psychiatric: Normal judgment and insight. Alert and oriented x 3. Normal mood.   Data Reviewed:    Labs on Admission: I have personally reviewed following labs and imaging studies  CBC: Recent Labs  Lab 10/03/22 1354 10/03/22 2029  WBC 31.3*  --   NEUTROABS 27.8*  --   HGB 6.4* 6.3*  HCT 20.2* 19.6*  MCV 89.0  --   PLT 566*  --    Basic Metabolic Panel: Recent Labs  Lab 10/03/22 1354  NA 130*  K 4.2  CL 103  CO2 18*  GLUCOSE 246*  BUN 31*  CREATININE 1.64*  CALCIUM 8.2*   GFR: Estimated Creatinine Clearance: 55.6 mL/min (A) (by C-G formula based on SCr of 1.64 mg/dL (H)). Liver Function Tests: Recent Labs  Lab 10/03/22 1354  AST 11*  ALT 8  ALKPHOS 108  BILITOT 0.3  PROT 6.2*  ALBUMIN 2.1*   Recent Labs  Lab 10/03/22 1354  LIPASE 11   No results for input(s): "AMMONIA" in the last 168 hours. Coagulation Profile: Recent Labs  Lab 10/03/22 1354  INR 1.1   Cardiac Enzymes: No results for  input(s): "CKTOTAL", "CKMB", "CKMBINDEX", "TROPONINI" in the last 168 hours. BNP (last 3 results) No results for input(s): "PROBNP" in the last 8760 hours. HbA1C: No results for input(s): "HGBA1C" in the last 72 hours. CBG: Recent Labs  Lab 10/03/22 1253 10/03/22 2218  GLUCAP 222* 226*   Lipid Profile: No results for input(s): "CHOL", "HDL", "LDLCALC", "TRIG", "CHOLHDL", "LDLDIRECT" in the last 72 hours. Thyroid Function Tests: No results for input(s): "TSH", "T4TOTAL", "FREET4", "T3FREE", "THYROIDAB" in the last 72 hours. Anemia Panel: No results for input(s): "VITAMINB12", "FOLATE", "FERRITIN", "TIBC", "IRON", "RETICCTPCT" in the last 72 hours. Urine analysis:    Component Value Date/Time   COLORURINE YELLOW 05/05/2022 1416   APPEARANCEUR CLEAR 05/05/2022 1416   LABSPEC 1.016 05/05/2022 1416   PHURINE 6.0 05/05/2022 1416   GLUCOSEU >=500 (A) 05/05/2022 1416   GLUCOSEU NEGATIVE 05/22/2020 1417   HGBUR SMALL (A) 05/05/2022 1416   BILIRUBINUR NEGATIVE 05/05/2022 1416   KETONESUR NEGATIVE 05/05/2022 1416   PROTEINUR >=300 (A) 05/05/2022 1416   UROBILINOGEN 0.2 05/22/2020 1417   NITRITE NEGATIVE 05/05/2022 1416   LEUKOCYTESUR NEGATIVE 05/05/2022 1416    Radiological Exams on Admission: DG Foot Complete Left  Result Date: 10/03/2022 CLINICAL DATA:  Bilateral foot pain EXAM: LEFT FOOT - COMPLETE 3+ VIEW COMPARISON:  09/25/2022, 11/12/2021 FINDINGS: No acute fracture or dislocation. No aggressive osseous lesion. Normal alignment. 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 Amputation of the fourth proximal,, middle and distal phalanx. Soft tissue are unremarkable. No radiopaque foreign body or soft tissue emphysema. Peripheral vascular atherosclerotic disease. IMPRESSION: 1.  No acute osseous injury of the left foot. Electronically Signed   By: Elige Ko M.D.   On: 10/03/2022 16:17   DG Foot Complete Right  Result Date: 10/03/2022 CLINICAL DATA:   Right foot pain EXAM: RIGHT FOOT COMPLETE - 3+ VIEW COMPARISON:  09/25/2022, 09/26/2017 FINDINGS: No acute fracture or dislocation. No aggressive osseous lesion. Normal alignment. Marginal erosion along the medial aspect of the second, third and fourth metatarsal heads as can be seen with an inflammatory or crystalline arthropathy. 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 Soft tissue edema along the dorsal aspect of the foot and around the distal lower leg which may be reactive versus secondary  to cellulitis. No radiopaque foreign body or soft tissue emphysema. IMPRESSION: 1.  No acute osseous injury of the right foot. 2. Marginal erosion along the medial aspect of the second, third and fourth metatarsal heads as can be seen with an inflammatory or crystalline arthropathy. Electronically Signed   By: Elige Ko M.D.   On: 10/03/2022 16:15   DG Chest 2 View  Result Date: 10/03/2022 CLINICAL DATA:  Suspected Sepsis EXAM: CHEST - 2 VIEW COMPARISON:  None Available. FINDINGS: No pleural effusion. No pneumothorax. No focal airspace opacity. Normal cardiac and mediastinal contours. No radiographically displaced rib fractures. Visualized upper abdomen is possible. Vertebral body heights are maintained. IMPRESSION: No focal airspace opacity Electronically Signed   By: Lorenza Cambridge M.D.   On: 10/03/2022 14:13    EKG: Independently reviewed.   Assessment and Plan: * Sepsis due to cellulitis (HCC) Pt with sepsis due to cellulitis of BLE from diabetic foot ulcers (see photos above from earlier today, also available in chart are photos from 9 days ago). LE wound pathway Message sent to Dr. Logan Bores (pt is a TFA patient), reportedly EDP called and spoke with TFA earlier this evening as well MRI of B feet to look for evidence of osteomyelitis No subcutaneous air on plain film X rays, though there was concern for possible osteomyelitis unilaterally. Empiric cefepime / flagyl / vanc ABIs  AKI (acute kidney  injury) (HCC) Creat 1.6 today up from 1.1 at end of July. Presumably pre-renal / ATN in setting of sepsis associated with LE wounds. IVF Hold home BP meds Repeat BMP in AM Intake and output  Anemia 1u PRBC transfusion ordered for HGB < 7 which appears to be real and verified on repeat. No obvious stigmata of GIB, and Hemoccult today was negative. I suspect the cause of her anemia may be due to hemolysis associated with infection. 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 Today however, pt is not being admitted for chronic abd pain associated with diabetic gastroparesis, instead this patient clearly has an acute medical condition causing acute pain this admission (see photos of feet above). Therefore, will put pt on PRN IV dilaudid if needed this admission for severe pain.  DM type 1 (diabetes mellitus, type 1) (HCC) Cont Levemir Sensitive SSI Q4H.  Diabetic gastroparesis associated with type 1 diabetes mellitus (HCC) Not flaring currently. Continue scheduled reglan 10mg  TID [facility]      Advance Care Planning:   Code Status: Full Code   Consults: Message sent to Dr. Logan Bores  Family Communication: Mother on phone, reassured mother that we would be getting MRIs of both feet.  Severity of Illness: The appropriate patient status for this patient is INPATIENT. Inpatient status is judged to be reasonable and necessary in order to provide the required intensity of service to ensure the patient's safety. The patient's presenting symptoms, physical exam findings, and initial radiographic and laboratory data in the context of their chronic comorbidities is felt to place them at high risk for further clinical deterioration. Furthermore, it is not anticipated that the patient will be medically stable for discharge from the hospital within 2 midnights of admission.   * I  certify that at the point of admission it is my clinical judgment that the patient will require inpatient hospital care spanning beyond 2 midnights from the point of admission due to high intensity of service, high risk for further deterioration  and high frequency of surveillance required.*  Author: Hillary Bow., DO 10/04/2022 12:05 AM  For on call review [email or web address].

## 2022-10-04 NOTE — Progress Notes (Signed)
ABI exam has been completed.   Results can be found under chart review under CV PROC. 10/04/2022 10:05 AM Kutler Vanvranken RVT, RDMS

## 2022-10-04 NOTE — Consult Note (Signed)
PODIATRY CONSULTATION  NAME Kelli Hudson MRN [id number] DOB 02-17-1991 DOA 10/03/2022   Reason for consult: B/L diabetic foot wounds Chief Complaint  Patient presents with   Leg Swelling    Consulting physician: Lyda Perone DO, [facility]  History of present illness: 32 y.o. female admitted for worsening infections of bilateral foot wounds.  Acute onset of fever with increased pain.  Blisters developed about 2 weeks ago.  Patient cannot recall any incident or activity that would have elicited the blisters to develop.  Seen by Dr. Ardelle Anton in our office, [facility], on 09/25/2022 and at that time the wounds appeared stable and the patient was not experiencing any systemic signs of infection.  Doxycycline was prescribed and Silvadene provided to apply daily to the foot wounds.  Onset of fever with chills and increased pain to the feet over the past 48 hours.  Presented to [facility] health ED at [facility] and admitted at [place].  Podiatry consulted  Past Medical History:  Diagnosis Date   Acute H. pylori gastric ulcer    Coffee ground emesis    Diabetes mellitus (HCC)    Diabetic gastroparesis (HCC)    DKA (diabetic ketoacidosis) (HCC) 02/24/2021   Gastroparesis    GERD (gastroesophageal reflux disease)    Hypertension    Hyperthyroidism    Intractable nausea and vomiting 04/20/2021   Normocytic anemia 04/20/2020   Prolonged Q-T interval on ECG        Latest Ref Rng & Units 10/04/2022    4:09 AM 10/03/2022    8:29 PM 10/03/2022    1:54 PM  CBC  WBC 4.0 - 10.5 K/uL 26.9   31.3   Hemoglobin 12.0 - 15.0 g/dL 6.8  6.3  6.4   Hematocrit 36.0 - 46.0 % 21.6  19.6  20.2   Platelets 150 - 400 K/uL 724   566        Latest Ref Rng & Units 10/04/2022    4:09 AM 10/03/2022    1:54 PM 05/09/2022    6:12 AM  BMP  Glucose 70 - 99 mg/dL 782  956  213   BUN 6 - 20 mg/dL 31  31  26    Creatinine 0.44 - 1.00 mg/dL 0.86  5.78  4.69   Sodium  135 - 145 mmol/L 133  130  134   Potassium 3.5 - 5.1 mmol/L 3.8  4.2  4.5   Chloride 98 - 111 mmol/L 106  103  108   CO2 22 - 32 mmol/L 19  18  19    Calcium 8.9 - 10.3 mg/dL 8.3  8.2  8.9           B/L feet 10/03/2022   Physical Exam: General: The patient is alert and oriented x3 in no acute distress.   Dermatology: Bilateral foot wounds noted with well adhered eschar/necrotic tissue.  Please see above noted photos.  Mild malodor.  Serous drainage.  Vascular: VAS [place] ABI WITH/WO TBI 10/04/2022 ABI Findings:  +---------+------------------+-----+---------+--------+  Right   Rt Pressure (mmHg)IndexWaveform Comment   +---------+------------------+-----+---------+--------+  Brachial 145                    triphasic          +---------+------------------+-----+---------+--------+  PTA     198               1.21 triphasic          +---------+------------------+-----+---------+--------+  DP  179               1.10 biphasic           +---------+------------------+-----+---------+--------+  Great Toe143               0.88 Normal             +---------+------------------+-----+---------+--------+   +---------+------------------+-----+---------+-------+  Left    Lt Pressure (mmHg)IndexWaveform Comment  +---------+------------------+-----+---------+-------+  Brachial 163                    triphasic         +---------+------------------+-----+---------+-------+  PTA     185               1.13 triphasic         +---------+------------------+-----+---------+-------+  DP      183               1.12 triphasic         +---------+------------------+-----+---------+-------+  Great Toe129               0.79 Normal            +---------+------------------+-----+---------+-------+   +-------+-----------+-----------+------------+------------+  ABI/TBIToday's ABIToday's TBIPrevious ABIPrevious TBI   +-------+-----------+-----------+------------+------------+  Right 1.21       0.88       1.12        0.91          +-------+-----------+-----------+------------+------------+  Left  1.13       0.79       1.18        1.03          +-------+-----------+-----------+------------+------------+   >15 mmHG between brachial pressure despite multiple readings.    Summary:  Right: Resting right ankle-brachial index is within normal range. The right toe-brachial index is normal.   Left: Resting left ankle-brachial index is within normal range. The left toe-brachial index is normal.   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 Patient ambulatory  DG Foot Complete Left 10/03/2022 IMPRESSION: 1.  No acute osseous injury of the left foot.  DG Foot Complete Right 10/03/2022 IMPRESSION: 1.  No acute osseous injury of the right foot. 2. Marginal erosion along the medial aspect of the second, third and fourth metatarsal heads as can be seen with an inflammatory or crystalline arthropathy.  ASSESSMENT/PLAN OF CARE Bilateral diabetic foot wounds with possible underlying abscess/cellulitis  -Patient evaluated today with her mother at bedside -MRI is pending -ABIs reviewed.  Currently no concern for vascular compromise or insufficiency -Plan for taking the patient down to the OR tomorrow a.m. for irrigation and debridement of bilateral foot wounds.  This was discussed in detail with the patient and mother who was present today.  No guarantees were expressed or implied.  Patient questions were answered.  She is amenable to this plan -Preoperative orders placed.  NPO after midnight -Continue broad-spectrum ABX Maxipime and Flagyl as ordered -Will follow   Thank you for the consult.  Please contact me directly via secure chat with any questions or concerns.     [name], DPM [facility]  Dr. [name], DPM    [address]  [place], [place] [address]                Office [phone]  Fax [phone]

## 2022-10-04 NOTE — Progress Notes (Signed)
A consult was placed to the hospital's IV Nurse for new access; pt with low blood sugar; Rapid Response RN at bedside;  this writer attempted x 1 in the left ant forearm with ultrasound; unable to thread catheter; no other suitable veins noted with ultrasound; pt has had multiple iv restarts; Amil Amen, RN aware of no iv access; Rapid Response nurse has also assessed for IV access.  Suggest central access for this pt.  Thank you.

## 2022-10-04 NOTE — Progress Notes (Signed)
Hypoglycemic Event  CBG: 48  Treatment: D50 50 mL (25 gm)  Symptoms: Shaky  Follow-up CBG: Time:1310 CBG Result:102  Possible Reasons for Event: Inadequate meal intake  Comments/MD notified:hypoglycemic protocol    Kelli Hudson

## 2022-10-04 NOTE — Assessment & Plan Note (Signed)
Creat 1.6 today up from 1.1 at end of July. Presumably pre-renal / ATN in setting of sepsis associated with LE wounds. IVF Hold home BP meds Repeat BMP in AM Intake and output

## 2022-10-04 NOTE — Progress Notes (Signed)
   10/04/22 1236  Assess: MEWS Score  Temp (!) 100.6 F (38.1 C)  BP (!) 152/90  MAP (mmHg) 108  Pulse Rate (!) 126  Resp 16  SpO2 100 %  O2 Device Room Air  Assess: MEWS Score  MEWS Temp 1  MEWS Systolic 0  MEWS Pulse 2  MEWS RR 0  MEWS LOC 0  MEWS Score 3  MEWS Score Color Yellow  Assess: if the MEWS score is Yellow or Red  Were vital signs accurate and taken at a resting state? Yes  Does the patient meet 2 or more of the SIRS criteria? No  MEWS guidelines implemented  Yes, yellow  Treat  MEWS Interventions Considered administering scheduled or prn medications/treatments as ordered  Take Vital Signs  Increase Vital Sign Frequency  Yellow: Q2hr x1, continue Q4hrs until patient remains green for 12hrs  Escalate  MEWS: Escalate Yellow: Discuss with charge nurse and consider notifying provider and/or RRT  Notify: Charge Nurse/RN  Name of Charge Nurse/RN Notified Dagoberto Ligas RN  Assess: SIRS CRITERIA  SIRS Temperature  0  SIRS Pulse 1  SIRS Respirations  0  SIRS WBC 0  SIRS Score Sum  1

## 2022-10-04 NOTE — H&P (View-Only) (Signed)
PODIATRY CONSULTATION  NAME Kelli Hudson MRN [id number] DOB 02-17-1991 DOA 10/03/2022   Reason for consult: B/L diabetic foot wounds Chief Complaint  Patient presents with   Leg Swelling    Consulting physician: Lyda Perone DO, [facility]  History of present illness: 32 y.o. female admitted for worsening infections of bilateral foot wounds.  Acute onset of fever with increased pain.  Blisters developed about 2 weeks ago.  Patient cannot recall any incident or activity that would have elicited the blisters to develop.  Seen by Dr. Ardelle Anton in our office, [facility], on 09/25/2022 and at that time the wounds appeared stable and the patient was not experiencing any systemic signs of infection.  Doxycycline was prescribed and Silvadene provided to apply daily to the foot wounds.  Onset of fever with chills and increased pain to the feet over the past 48 hours.  Presented to [facility] health ED at [facility] and admitted at [place].  Podiatry consulted  Past Medical History:  Diagnosis Date   Acute H. pylori gastric ulcer    Coffee ground emesis    Diabetes mellitus (HCC)    Diabetic gastroparesis (HCC)    DKA (diabetic ketoacidosis) (HCC) 02/24/2021   Gastroparesis    GERD (gastroesophageal reflux disease)    Hypertension    Hyperthyroidism    Intractable nausea and vomiting 04/20/2021   Normocytic anemia 04/20/2020   Prolonged Q-T interval on ECG        Latest Ref Rng & Units 10/04/2022    4:09 AM 10/03/2022    8:29 PM 10/03/2022    1:54 PM  CBC  WBC 4.0 - 10.5 K/uL 26.9   31.3   Hemoglobin 12.0 - 15.0 g/dL 6.8  6.3  6.4   Hematocrit 36.0 - 46.0 % 21.6  19.6  20.2   Platelets 150 - 400 K/uL 724   566        Latest Ref Rng & Units 10/04/2022    4:09 AM 10/03/2022    1:54 PM 05/09/2022    6:12 AM  BMP  Glucose 70 - 99 mg/dL 782  956  213   BUN 6 - 20 mg/dL 31  31  26    Creatinine 0.44 - 1.00 mg/dL 0.86  5.78  4.69   Sodium  135 - 145 mmol/L 133  130  134   Potassium 3.5 - 5.1 mmol/L 3.8  4.2  4.5   Chloride 98 - 111 mmol/L 106  103  108   CO2 22 - 32 mmol/L 19  18  19    Calcium 8.9 - 10.3 mg/dL 8.3  8.2  8.9           B/L feet 10/03/2022   Physical Exam: General: The patient is alert and oriented x3 in no acute distress.   Dermatology: Bilateral foot wounds noted with well adhered eschar/necrotic tissue.  Please see above noted photos.  Mild malodor.  Serous drainage.  Vascular: VAS [place] ABI WITH/WO TBI 10/04/2022 ABI Findings:  +---------+------------------+-----+---------+--------+  Right   Rt Pressure (mmHg)IndexWaveform Comment   +---------+------------------+-----+---------+--------+  Brachial 145                    triphasic          +---------+------------------+-----+---------+--------+  PTA     198               1.21 triphasic          +---------+------------------+-----+---------+--------+  DP  179               1.10 biphasic           +---------+------------------+-----+---------+--------+  Great Toe143               0.88 Normal             +---------+------------------+-----+---------+--------+   +---------+------------------+-----+---------+-------+  Left    Lt Pressure (mmHg)IndexWaveform Comment  +---------+------------------+-----+---------+-------+  Brachial 163                    triphasic         +---------+------------------+-----+---------+-------+  PTA     185               1.13 triphasic         +---------+------------------+-----+---------+-------+  DP      183               1.12 triphasic         +---------+------------------+-----+---------+-------+  Great Toe129               0.79 Normal            +---------+------------------+-----+---------+-------+   +-------+-----------+-----------+------------+------------+  ABI/TBIToday's ABIToday's TBIPrevious ABIPrevious TBI   +-------+-----------+-----------+------------+------------+  Right 1.21       0.88       1.12        0.91          +-------+-----------+-----------+------------+------------+  Left  1.13       0.79       1.18        1.03          +-------+-----------+-----------+------------+------------+   >15 mmHG between brachial pressure despite multiple readings.    Summary:  Right: Resting right ankle-brachial index is within normal range. The right toe-brachial index is normal.   Left: Resting left ankle-brachial index is within normal range. The left toe-brachial index is normal.   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 Patient ambulatory  DG Foot Complete Left 10/03/2022 IMPRESSION: 1.  No acute osseous injury of the left foot.  DG Foot Complete Right 10/03/2022 IMPRESSION: 1.  No acute osseous injury of the right foot. 2. Marginal erosion along the medial aspect of the second, third and fourth metatarsal heads as can be seen with an inflammatory or crystalline arthropathy.  ASSESSMENT/PLAN OF CARE Bilateral diabetic foot wounds with possible underlying abscess/cellulitis  -Patient evaluated today with her mother at bedside -MRI is pending -ABIs reviewed.  Currently no concern for vascular compromise or insufficiency -Plan for taking the patient down to the OR tomorrow a.m. for irrigation and debridement of bilateral foot wounds.  This was discussed in detail with the patient and mother who was present today.  No guarantees were expressed or implied.  Patient questions were answered.  She is amenable to this plan -Preoperative orders placed.  NPO after midnight -Continue broad-spectrum ABX Maxipime and Flagyl as ordered -Will follow   Thank you for the consult.  Please contact me directly via secure chat with any questions or concerns.     [name], DPM [facility]  Dr. [name], DPM    [address]  [place], [place] [address]                Office [phone]  Fax [phone]

## 2022-10-04 NOTE — Progress Notes (Signed)
Hypoglycemic Event  CBG: 46  Treatment: 8 oz juice/soda  Symptoms: Shaky and Vomiting  Follow-up CBG: Time:2352 CBG Result:81  Possible Reasons for Event: Inadequate meal intake  Comments/MD notified:James Garner Nash, NP notified    [place] [employer]

## 2022-10-04 NOTE — Consult Note (Signed)
WOC Nurse Consult Note: Reason for Consult:bilateral feet with ruptured and dried blisters. Cellulitis. See by Podiatric Medicine on 09/25/22 (Dr. Ardelle Anton) who implemented a topical POC. I will continue his orders in house. Consult performed remotely after review of EMR, including photographs. Wound type:neuropathic, infectious Pressure Injury POA: N/A Measurement:To be obtained today by Bedside RN during care provision and documented on nursing flow sheet Wound bed:see photos Drainage (amount, consistency, odor) none Periwound: edematous, erythematous Dressing procedure/placement/frequency: As noted above, I will continue the POC as initiated by Provider Dr. Dutch Quint (Podiatric Medicine) earlier this week using a once daily application of silver sulfadiazine (Silvadene) cream after cleansing. Recommend also consulting Dr. Ardelle Anton while in house. If you agree, please order consult.  WOC nursing team will not follow, but will remain available to this patient, the nursing and medical teams.  Please re-consult if needed.  Thank you for inviting [place] to participate in this patient's Plan of Care.  Ladona Mow, MSN, RN, CNS, GNP, Leda Min, [employer], [employer] phone:  [phone]

## 2022-10-04 NOTE — Hospital Course (Addendum)
Brief Narrative:  32 y.o. female with medical history significant of DM1, diabetic gastroparesis with very frequent admission in the past, HTN presented w/ c/o B foot wounds, fever, and pain and informed that she has been dealing with lower extremity wound for past 2 weeks ,followed with her podiatrist in the outpatient setting [facility] which she recently finished a course of doxycycline which did not help her symptoms, initially had a blister that ruptured subsequently with redness streaking up her legs and noticing fever chills worsening pain in last few days.  Patient admitted for sepsis due to cellulitis/lower extremity wound, severe anemia, AKI. Podiatry consulted- Unable to complete MRI as gastric pacemaker is not MRI safe.  ID and Ortho were consulted.  Patient went to the OR for bilateral lower extremity necrotizing fasciitis feet to his thigh, remained intubated and required PRBC and FFP transfusion during the case on 8/21.  Eventually extubated on 8/23 and was taken back to the OR on 8/26 for wound VAC change.  Patient has remained on IV antibiotics.  Also underwent bilateral lower extremity BKA by Dr. Lajoyce Corners on 9/6. Since her surgery intermittently having anemia without any obvious signs of blood loss requiring PRBC transfusion, WBC still remains elevated but stable, no antibiotics per ID.   Assessment & Plan:   Principal Problem:   Necrotizing fasciitis (HCC) Active Problems:   AKI (acute kidney injury) (HCC)   Sepsis due to cellulitis (HCC)   Cellulitis of lower extremity   Anemia   Diabetic gastroparesis associated with type 1 diabetes mellitus (HCC)   DM type 1 (diabetes mellitus, type 1) (HCC)   Drug-seeking behavior   Sepsis (HCC)   Necrotizing fasciitis of pelvic region and thigh (HCC)   Necrotizing fasciitis of lower leg (HCC)   MDD (major depressive disorder), recurrent episode, moderate (HCC)   Severe sepsis due to bilateral feet abscess, necrotizing  fasciitis, POA.  MRSE, sensitive Pseudomonas S/p bilateral transtibial amputation Initially underwent I&D on 8/21 with wound VAC change on 8/26 but ultimately required bilateral BKA by orthopedic on 9/6.  CT of bilateral lower extremity showing postsurgical changes with some edema, gaseous changes and some hematoma?.  Holding off on antibiotics per ID, discussed with Dr. Lajoyce Corners will see the patient today   Acute blood loss anemia due to operative blood loss Received multiple units of PRBC, IV iron.   Type 1 diabetes, poorly controlled with intermittent hypoglycemia Continue current sliding scale and Accu-Chek.  Adjust as necessary   Acute kidney injury on CKD stage IIIa due to septic ATN, improving Non-anion gap metabolic acidosis Hyponatremia, euvolemic Resolved now back to baseline creatinine 1.2.  Peaked at 2.54   Severe protein calorie malnutrition Cortrak removed 9/9.  Encouraging oral diet, dietitian following.  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  On Lasix  Mild hyperkalemia - Stable  Hypomagnesemia - PRN repletion   PT/OT-SNF Prior to discharge remove right upper extremity PICC line if no longer further requires it.   DVT prophylaxis: SCD   Code Status: Full Code  Family Communication: Mom up to date   Status is: Inpatient Remains inpatient appropriate because: Discharge to SNF once cleared by Ortho

## 2022-10-04 NOTE — Progress Notes (Signed)
Hypoglycemic Event  CBG: 63  Treatment: 8 oz juice/soda  Symptoms: Shaky  Follow-up CBG: Time:1236 CBG Result:48  Possible Reasons for Event: Vomiting and Inadequate meal intake  Comments/MD notified:hypoglycemic protocol    Curley Spice

## 2022-10-04 NOTE — Progress Notes (Signed)
PCCM note  PCCM was asked for IV access and patient is hypoglycemic and symptomatic without peripheral IVs Left IJ attempted but could not thread the wire after multiple attempts.  Procedure was aborted and right femoral central line placed Chest x-ray ordered   If she needs the central line for more than a few days then we should get a PICC as the femoral cannot stay for long.  Kelli Greathouse MD [facility] See Amion for pager  If no response to pager , please call [phone] until 7pm After 7:00 pm call Elink  [phone] 10/04/2022, 7:05 PM

## 2022-10-04 NOTE — Plan of Care (Signed)
  Problem: Education: Goal: Knowledge of General Education information will improve Description: Including pain rating scale, medication(s)/side effects and non-pharmacologic comfort measures Outcome: Progressing   Problem: Nutrition: Goal: Adequate nutrition will be maintained Outcome: Progressing   Problem: Pain Managment: Goal: General experience of comfort will improve Outcome: Progressing   Problem: Safety: Goal: Ability to remain free from injury will improve Outcome: Progressing   Problem: Metabolic: Goal: Ability to maintain appropriate glucose levels will improve Outcome: Not Progressing

## 2022-10-04 NOTE — Plan of Care (Signed)

## 2022-10-04 NOTE — Procedures (Signed)
Central Venous Catheter Insertion Procedure Note  Kelli Hudson  [id number]  Dec 04, 1990  Date:10/04/22  Time:7:07 PM   Provider Performing:Roniqua Kintz   Procedure: Insertion of Non-tunneled Central Venous [phone]) with US guidance ([id number])   Indication(s) Medication administration and Difficult access  Consent Risks of the procedure as well as the alternatives and risks of each were explained to the patient and/or caregiver.  Consent for the procedure was obtained and is signed in the bedside chart  Anesthesia Topical only with 1% lidocaine   Timeout Verified patient identification, verified procedure, site/side was marked, verified correct patient position, special equipment/implants available, medications/allergies/relevant history reviewed, required imaging and test results available.  Sterile Technique Maximal sterile technique including full sterile barrier drape, hand hygiene, sterile gown, sterile gloves, mask, hair covering, sterile ultrasound probe cover (if used).  Procedure Description Area of catheter insertion was cleaned with chlorhexidine and draped in sterile fashion.  With real-time ultrasound guidance a central venous catheter was placed into the right femoral vein. Nonpulsatile blood flow and easy flushing noted in all ports.  The catheter was sutured in place and sterile dressing applied.  Complications/Tolerance None; patient tolerated the procedure well. Chest X-ray is ordered to verify placement for internal jugular or subclavian cannulation.   Chest x-ray is not ordered for femoral cannulation.  EBL Minimal  Specimen(s) None  [name] MD [facility] See Amion for pager  If no response to pager , please call [phone] until 7pm After 7:00 pm call Elink  [phone] 10/04/2022, 7:07 PM

## 2022-10-04 NOTE — Progress Notes (Signed)
PROGRESS NOTE Kelli Hudson  GNF:[id number] DOB: 02/14/1991 DOA: 10/03/2022 PCP: Norm Salt, PA  Brief Narrative/Hospital Course: 32 y.o. female with medical history significant of DM1, diabetic gastroparesis with very frequent admission in the past, HTN presented w/ c/o B foot wounds, fever, and pain and informed that she has been dealing with lower extremity wound for past 2 weeks ,followed with her podiatrist in the outpatient setting at [facility] there which she recently finished a course of doxycycline which did not help her symptoms, initially had a blister that ruptured subsequently with redness streaking up her legs and noticing fever chills worsening pain in last few days.  In the ED Tmax 102.5,  HR upto 115,BP soft, not hypoxic.  Labs with significant leukocytosis 13.3, lactic acid normal 0.9, hyponatremia 131 creatinine 1.6 bicarb 18 normal LFTs and lipase, hemoglobin up to 6.4 g blood sugar up to 246. On imaging with chest x-ray, x-rays of right and left foot-erosions noted no osseous changes  Patient admitted for sepsis due to cellulitis/lower extremity wound, severe anemia, AKI. Podiatry consulted     Subjective: Patient seen and examined this morning Her mother is at the bedside Overnight no fever last temperature was in the ED 1 or 2.5.  BP stable Labs with downtrending WBC count, completing blood transfusion  Assessment and Plan: Principal Problem:   Sepsis due to cellulitis (HCC) Active Problems:   AKI (acute kidney injury) (HCC)   Cellulitis of lower extremity   Anemia   Diabetic gastroparesis associated with type 1 diabetes mellitus (HCC)   DM type 1 (diabetes mellitus, type 1) (HCC)   Drug-seeking behavior   Patient seen and examined personally, I reviewed the chart, history and physical and admission note, done by admitting physician this morning and agree with the same with following addendum.  Please refer to the morning admission note  for more detailed plan of care.   Sepsis due to cellulitis Lower extremity wounds: Afebrile since admission BP soft/stable, WBC downtrending.  Monitor.X-ray no bony changes MRI ordered.  Podiatry has been consulted.  CRP of 16.4 continue with current empiric cefepime Flagyl vancomycin.ABI completed this morning showing normal range.  Podiatry following and planning for OR tomorrow for debridement Recent Labs  Lab 10/03/22 1354 10/04/22 0409  WBC 31.3* 26.9*  LATICACIDVEN 0.9  --     AKI on CKD 3a Baseline creatinine seems to be around 1.19-1.3 back in March 2024 Metabolic acidosis: Likely in the setting of sepsis and wound infection question UTI.  Holding antihypertensive avoid toxic medication, avoid hypotension.  Monitor intake output and trend labs Recent Labs    04/17/22 1245 04/18/22 0647 04/20/22 0857 05/05/22 1305 05/06/22 0317 05/07/22 0601 05/08/22 0532 05/09/22 0612 10/03/22 1354 10/04/22 0409  BUN 22* 18 13 23* 21* 26* 27* 26* 31* 31*  CREATININE 1.49* 1.26* 1.52* 1.42* 1.07* 1.72* 1.30* 1.19* 1.64* 1.76*  CO2 22 24 31 25  20* 21* 20* 19* 18* 19*    Anemia No obvious stigmata of GI bleeding, Hemoccult was negative.  Likely in the setting of chronic disease, infection.  Unremarkable less likely hemolysis.  Transfusing 1 unit PRBC recheck and transfuse if less than 7 g Recent Labs    03/24/22 0553 03/25/22 0543 05/09/22 0612 10/03/22 1354 10/03/22 2029 10/04/22 0409 10/04/22 1200  HGB 10.1*   < > 11.2* 6.4* 6.3* 6.8* 9.1*  MCV 93.9   < > 89.2 89.0  --  93.9  --   VITAMINB12 771  --   --   --   --   --   --  FOLATE 12.0  --   --   --   --   --   --   FERRITIN 18  --   --   --   --   --   --   TIBC 417  --   --   --   --   --   --   IRON 78  --   --   --   --   --   --   RETICCTPCT 2.3  --   --   --   --   --   --    < > = values in this interval not displayed.    Thrombocytosis: likely in the setting of infection.Monitor  Type 1 diabetes mellitus w/  hyperglycemia: Uncontrolled hyperglycemia-basal diabetes.  Reports taking 8 units insulin twice a day, will hold off on long-acting insulin due to hypoglycemia, keep on SSI, recheck A1c. Recent Labs  Lab 10/04/22 0411 10/04/22 0756 10/04/22 1122 10/04/22 1236 10/04/22 1310  GLUCAP 150* 77 63* 48* 102*    Diabetic gastroparesis s/p gastric pacemaker: Currently stable.  Continue scheduled Reglan 10 3 times daily, add as needed Zofran, last QTc was normal on 8/16 admission. Mother endorses that patient is in the hospital 3 out of 7 days due to gastroparesis and followed by specialist in [place].  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 Today however, pt is not being admitted for chronic abd pain associated with diabetic gastroparesis, instead this patient clearly has an acute medical condition causing acute pain this admission (see photos of feet above). Placed on- PRN IV dilaudid if needed this admission for severe pain  DVT prophylaxis: Place and maintain sequential compression device Start: 10/04/22 0825 Code Status:   Code Status: Full Code Family Communication: plan of care discussed with patient and her mother at bedside. Patient status is: Inpatient because of sepsis/leg wound Level of care: Progressive   Dispo: The patient is from: home w/ boyfriend and is disabled.            Anticipated disposition: TBD Objective: Vitals last 24 hrs: Vitals:   10/04/22 0646 10/04/22 0853 10/04/22 1008 10/04/22 1236  BP: 112/64 (!) 141/87 (!) 148/94 (!) 152/90  Pulse: 87 100 (!) 103 (!) 126  Resp: 16 16 16 16   Temp: 97.6 F (36.4 C) 98.3 F (36.8 C) 97.6 F (36.4 C) (!) 100.6 F (38.1 C)  TempSrc: Oral Oral Oral Oral  SpO2: 100% 100% 100% 100%  Weight:      Height:       Weight change:   Physical Examination: General exam: alert awake, older than stated  age HEENT:Oral mucosa moist, Ear/Nose WNL grossly Respiratory system: bilaterally CLEAR BS, no use of accessory muscle Cardiovascular system: S1 & S2 +, No JVD. Gastrointestinal system: Abdomen soft,NT,ND, BS+ Nervous System:Alert, awake, moving extremities. Extremities: LE edema mild with leg wounds- see pic in media, with fould smeel and draiange Skin: No rashes,no icterus. MSK: Normal muscle bulk,tone, power  Medications reviewed:  Scheduled Meds:  sodium chloride   Intravenous Once   insulin aspart  0-9 Units Subcutaneous TID WC   metoCLOPramide  10 mg Oral TID AC   silver sulfADIAZINE   Topical Daily   Continuous Infusions:  sodium chloride     ceFEPime (MAXIPIME) IV 2 g (10/04/22 1216)   metronidazole 500 mg (10/04/22 0442)   vancomycin  Diet Order             Diet Carb Modified Fluid consistency: Thin; Room service appropriate? Yes  Diet effective now                  Intake/Output Summary (Last 24 hours) at 10/04/2022 1324 Last data filed at 10/04/2022 0853 Gross per 24 hour  Intake 2193.33 ml  Output 400 ml  Net 1793.33 ml   Net IO Since Admission: 1,793.33 mL [10/04/22 1324]  Wt Readings from Last 3 Encounters:  10/03/22 84.8 kg  06/18/22 83.5 kg  06/16/22 83.9 kg     Unresulted Labs (From admission, onward)     Start     Ordered   10/05/22 0500  Basic metabolic panel  Daily,   R      10/04/22 0818   10/05/22 0500  CBC  Daily,   R      10/04/22 0818   10/04/22 1200  Sedimentation rate  Once,   R        10/04/22 1200          Data Reviewed: I have personally reviewed following labs and imaging studies CBC: Recent Labs  Lab 10/03/22 1354 10/03/22 2029 10/04/22 0409 10/04/22 1200  WBC 31.3*  --  26.9*  --   NEUTROABS 27.8*  --   --   --   HGB 6.4* 6.3* 6.8* 9.1*  HCT 20.2* 19.6* 21.6* 30.5*  MCV 89.0  --  93.9  --   PLT 566*  --  724*  --    Basic Metabolic Panel: Recent Labs  Lab 10/03/22 1354 10/04/22 0409  NA 130* 133*   K 4.2 3.8  CL 103 106  CO2 18* 19*  GLUCOSE 246* 160*  BUN 31* 31*  CREATININE 1.64* 1.76*  CALCIUM 8.2* 8.3*   GFR: Estimated Creatinine Clearance: 51.8 mL/min (A) (by C-G formula based on SCr of 1.76 mg/dL (H)). Liver Function Tests: Recent Labs  Lab 10/03/22 1354  AST 11*  ALT 8  ALKPHOS 108  BILITOT 0.3  PROT 6.2*  ALBUMIN 2.1*   Recent Labs  Lab 10/03/22 1354  LIPASE 11   Recent Results (from the past 240 hour(s))  Culture, blood (Routine x 2)     Status: None (Preliminary result)   Collection Time: 10/03/22  1:52 PM   Specimen: BLOOD RIGHT ARM  Result Value Ref Range Status   Specimen Description   Final    BLOOD RIGHT ARM Performed at [facility], [address] [place], [place] [address]    Special Requests   Final    BOTTLES DRAWN AEROBIC AND ANAEROBIC Blood Culture adequate volume Performed at [facility], [address] [place], [place] [address]    Culture   Final    NO GROWTH < 12 HOURS Performed at [facility], [address] [place], [place] [address]    Report Status PENDING  Incomplete  SARS Coronavirus 2 by RT PCR (hospital order, performed in [facility] hospital lab) *cepheid single result test* Anterior Nasal Swab     Status: None   Collection Time: 10/03/22  1:54 PM   Specimen: Anterior Nasal Swab  Result Value Ref Range Status   SARS Coronavirus 2 by RT PCR NEGATIVE NEGATIVE Final    Comment: (NOTE) SARS-CoV-2 target nucleic acids are NOT DETECTED.  The SARS-CoV-2 RNA is generally detectable in upper and lower respiratory specimens during the acute phase of infection. The  lowest concentration of SARS-CoV-2 viral copies this assay can detect is 250 copies / mL. A negative result does not preclude SARS-CoV-2 infection and should not be used as the sole basis for treatment or other patient management decisions.  A negative result may occur with improper specimen collection / handling, submission of  specimen other than nasopharyngeal swab, presence of viral mutation(s) within the areas targeted by this assay, and inadequate number of viral copies (<250 copies / mL). A negative result must be combined with clinical observations, patient history, and epidemiological information.  Fact Sheet for Patients:   RoadLapTop.co.za  Fact Sheet for Healthcare Providers: http://kim-miller.com/  This test is not yet approved or  cleared by the [place] FDA and has been authorized for detection and/or diagnosis of SARS-CoV-2 by FDA under an Emergency Use Authorization (EUA).  This EUA will remain in effect (meaning this test can be used) for the duration of the COVID-19 declaration under Section 564(b)(1) of the Act, 21 U.S.C. section 360bbb-3(b)(1), unless the authorization is terminated or revoked sooner.  Performed at [facility], [address], [place], [place] [address]     Antimicrobials: Anti-infectives (From admission, onward)    Start     Dose/Rate Route Frequency Ordered Stop   10/04/22 1600  vancomycin (VANCOREADY) IVPB 1250 mg/250 mL        1,250 mg 166.7 mL/hr over 90 Minutes Intravenous Every 24 hours 10/03/22 2312     10/04/22 0000  ceFEPIme (MAXIPIME) 2 g in sodium chloride 0.9 % 100 mL IVPB        2 g 200 mL/hr over 30 Minutes Intravenous Every 12 hours 10/03/22 2312     10/03/22 1530  vancomycin (VANCOCIN) 500 mg in sodium chloride 0.9 % 100 mL IVPB        500 mg 100 mL/hr over 60 Minutes Intravenous  Once 10/03/22 1418 10/03/22 2020   10/03/22 1430  vancomycin (VANCOCIN) IVPB 1000 mg/200 mL premix        1,000 mg 200 mL/hr over 60 Minutes Intravenous  Once 10/03/22 1417 10/03/22 1830   10/03/22 1415  metroNIDAZOLE (FLAGYL) IVPB 500 mg        500 mg 100 mL/hr over 60 Minutes Intravenous Every 12 hours 10/03/22 1413 10/10/22 1659   10/03/22 1415  ceFEPIme (MAXIPIME) 2 g in sodium chloride 0.9 %  100 mL IVPB        2 g 200 mL/hr over 30 Minutes Intravenous  Once 10/03/22 1417 10/03/22 1530      Culture/Microbiology    Component Value Date/Time   SDES  10/03/2022 1352    BLOOD RIGHT ARM Performed at [facility], [address] [place], [place] [address]    [facility]  10/03/2022 1352    BOTTLES DRAWN AEROBIC AND ANAEROBIC Blood Culture adequate volume Performed at [facility], [address], [place], [place] [address]    CULT  10/03/2022 1352    NO GROWTH < 12 HOURS Performed at [facility], [address], [place], [place] [address]    REPTSTATUS PENDING 10/03/2022 1352    Other culture-see note  Radiology Studies: VAS [place] ABI WITH/WO TBI  Result Date: 10/04/2022  LOWER EXTREMITY DOPPLER STUDY Patient Name:  Kaleeah Quist  Date of Exam:   10/04/2022 Medical Rec #: [id number]              Accession #:    [id number] Date of Birth: 02-04-1991  Patient Gender: F Patient Age:   34 years Exam Location:  [facility] Procedure:      VAS [place] ABI WITH/WO TBI Referring Phys: JARED GARDNER --------------------------------------------------------------------------------  Indications: Bilateral foot wounds High Risk Factors: Hyperlipidemia, Diabetes, no history of smoking. 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  Comparison Study: Previous exam 03/13/2021 Performing Technologist: Ernestene Mention RVT, RDMS  Examination Guidelines: A complete evaluation includes at minimum, Doppler waveform signals and systolic blood pressure reading at the level of bilateral brachial, anterior tibial, and posterior tibial arteries, when vessel segments are accessible. Bilateral testing is considered an integral part of a complete examination. Photoelectric Plethysmograph (PPG) waveforms and toe systolic pressure readings are included as required and additional duplex testing as needed. Limited examinations for reoccurring indications may be performed  as noted.  ABI Findings: +---------+------------------+-----+---------+--------+ Right    Rt Pressure (mmHg)IndexWaveform Comment  +---------+------------------+-----+---------+--------+ Brachial 145                    triphasic         +---------+------------------+-----+---------+--------+ PTA      198               1.21 triphasic         +---------+------------------+-----+---------+--------+ DP       179               1.10 biphasic          +---------+------------------+-----+---------+--------+ Great Toe143               0.88 Normal            +---------+------------------+-----+---------+--------+ +---------+------------------+-----+---------+-------+ Left     Lt Pressure (mmHg)IndexWaveform Comment +---------+------------------+-----+---------+-------+ Brachial 163                    triphasic        +---------+------------------+-----+---------+-------+ PTA      185               1.13 triphasic        +---------+------------------+-----+---------+-------+ DP       183               1.12 triphasic        +---------+------------------+-----+---------+-------+ Great Toe129               0.79 Normal           +---------+------------------+-----+---------+-------+ +-------+-----------+-----------+------------+------------+ ABI/TBIToday's ABIToday's TBIPrevious ABIPrevious TBI +-------+-----------+-----------+------------+------------+ Right  1.21       0.88       1.12        0.91         +-------+-----------+-----------+------------+------------+ Left   1.13       0.79       1.18        1.03         +-------+-----------+-----------+------------+------------+  >15 mmHG between brachial pressure despite multiple readings.  Summary: Right: Resting right ankle-brachial index is within normal range. The right toe-brachial index is normal. Left: Resting left ankle-brachial index is within normal range. The left toe-brachial index is normal.  *See table(s) above for measurements and observations.  Electronically signed by Lemar Livings MD on 10/04/2022 at 11:28:17 AM.    Final    DG Foot Complete Left  Result Date: 10/03/2022 CLINICAL DATA:  Bilateral foot pain EXAM: LEFT FOOT - COMPLETE 3+ VIEW COMPARISON:  09/25/2022, 11/12/2021 FINDINGS: No acute fracture or dislocation. No aggressive osseous lesion. Normal alignment. 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 Amputation of the fourth proximal,, middle and distal phalanx. Soft tissue are unremarkable. No radiopaque foreign body or soft tissue emphysema. Peripheral vascular atherosclerotic disease. IMPRESSION: 1.  No acute osseous injury of the left foot. Electronically Signed   By: Elige Ko M.D.   On: 10/03/2022 16:17   DG Foot Complete Right  Result Date: 10/03/2022 CLINICAL DATA:  Right foot pain EXAM: RIGHT FOOT COMPLETE - 3+ VIEW COMPARISON:  09/25/2022, 09/26/2017 FINDINGS: No acute fracture or dislocation. No aggressive osseous lesion. Normal alignment. Marginal erosion along the medial aspect of the second, third and fourth metatarsal heads as can be seen with an inflammatory or crystalline arthropathy. 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 Soft tissue edema along the dorsal aspect of the foot and around the distal lower leg which may be reactive versus secondary to cellulitis. No radiopaque foreign body or soft tissue emphysema. IMPRESSION: 1.  No acute osseous injury of the right foot. 2. Marginal erosion along the medial aspect of the second, third and fourth metatarsal heads as can be seen with an inflammatory or crystalline arthropathy. Electronically Signed   By: Elige Ko M.D.   On: 10/03/2022 16:15   DG Chest 2 View  Result Date: 10/03/2022 CLINICAL DATA:  Suspected Sepsis EXAM: CHEST - 2 VIEW COMPARISON:  None Available. FINDINGS: No pleural effusion. No pneumothorax. No focal airspace opacity. Normal cardiac and mediastinal  contours. No radiographically displaced rib fractures. Visualized upper abdomen is possible. Vertebral body heights are maintained. IMPRESSION: No focal airspace opacity Electronically Signed   By: Lorenza Cambridge M.D.   On: 10/03/2022 14:13     LOS: 1 day   Lanae Boast, MD [facility]  10/04/2022, 1:24 PM

## 2022-10-05 ENCOUNTER — Inpatient Hospital Stay (HOSPITAL_COMMUNITY): Payer: Medicaid Other | Admitting: Anesthesiology

## 2022-10-05 ENCOUNTER — Encounter (HOSPITAL_COMMUNITY): Payer: Self-pay | Admitting: Internal Medicine

## 2022-10-05 ENCOUNTER — Encounter (HOSPITAL_COMMUNITY)
Admission: EM | Disposition: A | Discharge status: Skilled Nursing Facility | Payer: Self-pay | Source: Home / Self Care | Attending: Internal Medicine

## 2022-10-05 ENCOUNTER — Other Ambulatory Visit: Payer: Self-pay

## 2022-10-05 ENCOUNTER — Inpatient Hospital Stay (HOSPITAL_COMMUNITY): Payer: Self-pay | Admitting: Anesthesiology

## 2022-10-05 DIAGNOSIS — A419 Sepsis, unspecified organism: Secondary | ICD-10-CM | POA: Diagnosis not present

## 2022-10-05 DIAGNOSIS — L97519 Non-pressure chronic ulcer of other part of right foot with unspecified severity: Secondary | ICD-10-CM

## 2022-10-05 DIAGNOSIS — L97529 Non-pressure chronic ulcer of other part of left foot with unspecified severity: Secondary | ICD-10-CM | POA: Diagnosis not present

## 2022-10-05 DIAGNOSIS — L03116 Cellulitis of left lower limb: Secondary | ICD-10-CM | POA: Diagnosis not present

## 2022-10-05 DIAGNOSIS — L0390 Cellulitis, unspecified: Secondary | ICD-10-CM | POA: Diagnosis not present

## 2022-10-05 DIAGNOSIS — E10621 Type 1 diabetes mellitus with foot ulcer: Secondary | ICD-10-CM | POA: Diagnosis not present

## 2022-10-05 DIAGNOSIS — I10 Essential (primary) hypertension: Secondary | ICD-10-CM | POA: Diagnosis not present

## 2022-10-05 DIAGNOSIS — L03115 Cellulitis of right lower limb: Secondary | ICD-10-CM | POA: Diagnosis not present

## 2022-10-05 HISTORY — PX: IRRIGATION AND DEBRIDEMENT FOOT: SHX6602

## 2022-10-05 LAB — GLUCOSE, CAPILLARY
Glucose-Capillary: 209 mg/dL — ABNORMAL HIGH (ref 70–99)
Glucose-Capillary: 148 mg/dL — ABNORMAL HIGH (ref 70–99)
Glucose-Capillary: 133 mg/dL — ABNORMAL HIGH (ref 70–99)
Glucose-Capillary: 111 mg/dL — ABNORMAL HIGH (ref 70–99)
Glucose-Capillary: 96 mg/dL (ref 70–99)
Glucose-Capillary: 189 mg/dL — ABNORMAL HIGH (ref 70–99)
Glucose-Capillary: 205 mg/dL — ABNORMAL HIGH (ref 70–99)

## 2022-10-05 LAB — BASIC METABOLIC PANEL
Anion gap: 8 (ref 5–15)
BUN: 22 mg/dL — ABNORMAL HIGH (ref 6–20)
CO2: 18 mmol/L — ABNORMAL LOW (ref 22–32)
Calcium: 7.7 mg/dL — ABNORMAL LOW (ref 8.9–10.3)
Chloride: 107 mmol/L (ref 98–111)
Creatinine, Ser: 1.61 mg/dL — ABNORMAL HIGH (ref 0.44–1.00)
GFR, Estimated: 44 mL/min — ABNORMAL LOW (ref >60)
Glucose, Bld: 126 mg/dL — ABNORMAL HIGH (ref 70–99)
Potassium: 3.7 mmol/L (ref 3.5–5.1)
Sodium: 133 mmol/L — ABNORMAL LOW (ref 135–145)

## 2022-10-05 LAB — CBC
HCT: 22.2 % — ABNORMAL LOW (ref 36.0–46.0)
Hemoglobin: 7.1 g/dL — ABNORMAL LOW (ref 12.0–15.0)
MCH: 28.9 pg (ref 26.0–34.0)
MCHC: 32 g/dL (ref 30.0–36.0)
MCV: 90.2 fL (ref 80.0–100.0)
Platelets: 558 10*3/uL — ABNORMAL HIGH (ref 150–400)
RBC: 2.46 MIL/uL — ABNORMAL LOW (ref 3.87–5.11)
RDW: 15.6 % — ABNORMAL HIGH (ref 11.5–15.5)
WBC: 42.2 10*3/uL — ABNORMAL HIGH (ref 4.0–10.5)
nRBC: 0 % (ref 0.0–0.2)

## 2022-10-05 LAB — PREPARE RBC (CROSSMATCH)

## 2022-10-05 LAB — HEMOGLOBIN AND HEMATOCRIT, BLOOD
HCT: 27.2 % — ABNORMAL LOW (ref 36.0–46.0)
Hemoglobin: 8.7 g/dL — ABNORMAL LOW (ref 12.0–15.0)

## 2022-10-05 SURGERY — IRRIGATION AND DEBRIDEMENT FOOT
Anesthesia: General | Site: Foot | Laterality: Bilateral

## 2022-10-05 MED ORDER — PROPOFOL 10 MG/ML IV BOLUS
INTRAVENOUS | Status: AC
  Filled 2022-10-05: qty 20

## 2022-10-05 MED ORDER — SODIUM CHLORIDE 0.9% FLUSH: 10.0000 mL | INTRAVENOUS | Status: DC | PRN

## 2022-10-05 MED ORDER — FENTANYL CITRATE (PF) 100 MCG/2ML IJ SOLN
INTRAMUSCULAR | Status: AC
  Filled 2022-10-05: qty 2

## 2022-10-05 MED ORDER — JUVEN PO PACK
1.0000 | PACK | Freq: Two times a day (BID) | ORAL | Status: DC
  Administered 2022-10-06 – 2022-10-07 (×2): 1 via ORAL
  Filled 2022-10-05 (×2): qty 1

## 2022-10-05 MED ORDER — ACETAMINOPHEN 10 MG/ML IV SOLN
INTRAVENOUS | Status: DC | PRN
  Administered 2022-10-05: 1000 mg via INTRAVENOUS

## 2022-10-05 MED ORDER — ROCURONIUM BROMIDE 10 MG/ML (PF) SYRINGE
PREFILLED_SYRINGE | INTRAVENOUS | Status: DC | PRN
  Administered 2022-10-05: 10 mg via INTRAVENOUS

## 2022-10-05 MED ORDER — SODIUM CHLORIDE 0.9% FLUSH
10.0000 mL | Freq: Two times a day (BID) | INTRAVENOUS | Status: DC
  Administered 2022-10-07 – 2022-10-11 (×9): 10 mL
  Administered 2022-10-12: 30 mL
  Administered 2022-10-13 – 2022-11-01 (×36): 10 mL
  Administered 2022-11-02: 20 mL
  Administered 2022-11-02 – 2022-11-09 (×12): 10 mL
  Administered 2022-11-10: 20 mL
  Administered 2022-11-10 – 2022-11-11 (×2): 10 mL
  Administered 2022-11-11: 20 mL
  Administered 2022-11-12 – 2022-11-16 (×7): 10 mL
  Administered 2022-11-17: 20 mL
  Administered 2022-11-17 – 2022-11-19 (×6): 10 mL
  Administered 2022-11-20: 20 mL
  Administered 2022-11-20: 10 mL
  Administered 2022-11-21: 20 mL
  Administered 2022-11-21 – 2022-11-22 (×2): 10 mL

## 2022-10-05 MED ORDER — SUCCINYLCHOLINE CHLORIDE 200 MG/10ML IV SOSY
PREFILLED_SYRINGE | INTRAVENOUS | Status: DC | PRN
  Administered 2022-10-05: 120 mg via INTRAVENOUS

## 2022-10-05 MED ORDER — LACTATED RINGERS IV SOLN: INTRAVENOUS | Status: DC | PRN

## 2022-10-05 MED ORDER — ACETAMINOPHEN 10 MG/ML IV SOLN
INTRAVENOUS | Status: AC
  Filled 2022-10-05: qty 100

## 2022-10-05 MED ORDER — GABAPENTIN 300 MG PO CAPS
300.0000 mg | ORAL_CAPSULE | Freq: Three times a day (TID) | ORAL | Status: DC
  Administered 2022-10-05 – 2022-10-07 (×7): 300 mg via ORAL
  Filled 2022-10-05 (×7): qty 1

## 2022-10-05 MED ORDER — FERROUS SULFATE 325 (65 FE) MG PO TABS
325.0000 mg | ORAL_TABLET | Freq: Every day | ORAL | Status: DC
  Administered 2022-10-05 – 2022-10-08 (×4): 325 mg via ORAL
  Filled 2022-10-05 (×4): qty 1

## 2022-10-05 MED ORDER — PHENYLEPHRINE 80 MCG/ML (10ML) SYRINGE FOR IV PUSH (FOR BLOOD PRESSURE SUPPORT)
PREFILLED_SYRINGE | INTRAVENOUS | Status: AC
  Filled 2022-10-05: qty 10

## 2022-10-05 MED ORDER — LIDOCAINE HCL (PF) 2 % IJ SOLN
INTRAMUSCULAR | Status: AC
  Filled 2022-10-05: qty 5

## 2022-10-05 MED ORDER — SODIUM CHLORIDE 0.9% IV SOLUTION: Freq: Once | INTRAVENOUS | Status: AC

## 2022-10-05 MED ORDER — METOCLOPRAMIDE HCL 5 MG/ML IJ SOLN
10.0000 mg | Freq: Three times a day (TID) | INTRAMUSCULAR | Status: DC
  Administered 2022-10-05 – 2022-10-13 (×25): 10 mg via INTRAVENOUS
  Filled 2022-10-05 (×25): qty 2

## 2022-10-05 MED ORDER — ONDANSETRON HCL 4 MG/2ML IJ SOLN
INTRAMUSCULAR | Status: DC | PRN
Start: 2022-10-05 — End: 2022-10-05
  Administered 2022-10-05: 4 mg via INTRAVENOUS

## 2022-10-05 MED ORDER — DULOXETINE HCL 30 MG PO CPEP
30.0000 mg | ORAL_CAPSULE | Freq: Every day | ORAL | Status: DC
  Administered 2022-10-05 – 2022-10-08 (×4): 30 mg via ORAL
  Filled 2022-10-05 (×4): qty 1

## 2022-10-05 MED ORDER — DEXAMETHASONE SODIUM PHOSPHATE 10 MG/ML IJ SOLN
INTRAMUSCULAR | Status: DC | PRN
  Administered 2022-10-05: 4 mg via INTRAVENOUS

## 2022-10-05 MED ORDER — PHENYLEPHRINE 80 MCG/ML (10ML) SYRINGE FOR IV PUSH (FOR BLOOD PRESSURE SUPPORT)
PREFILLED_SYRINGE | INTRAVENOUS | Status: DC | PRN
Start: 2022-10-05 — End: 2022-10-05
  Administered 2022-10-05: 160 ug via INTRAVENOUS
  Administered 2022-10-05: 80 ug via INTRAVENOUS
  Administered 2022-10-05 (×2): 160 ug via INTRAVENOUS

## 2022-10-05 MED ORDER — BUPIVACAINE HCL (PF) 0.5 % IJ SOLN
INTRAMUSCULAR | Status: AC
  Filled 2022-10-05: qty 30

## 2022-10-05 MED ORDER — PROPOFOL 10 MG/ML IV BOLUS
INTRAVENOUS | Status: DC | PRN
  Administered 2022-10-05: 100 mg via INTRAVENOUS

## 2022-10-05 MED ORDER — FENTANYL CITRATE PF 50 MCG/ML IJ SOSY: 25.0000 ug | PREFILLED_SYRINGE | INTRAMUSCULAR | Status: DC | PRN

## 2022-10-05 MED ORDER — 0.9 % SODIUM CHLORIDE (POUR BTL) OPTIME
TOPICAL | Status: DC | PRN
  Administered 2022-10-05: 1000 mL

## 2022-10-05 MED ORDER — MIDAZOLAM HCL 2 MG/2ML IJ SOLN
INTRAMUSCULAR | Status: DC | PRN
  Administered 2022-10-05: 2 mg via INTRAVENOUS

## 2022-10-05 MED ORDER — GLUCERNA SHAKE PO LIQD
237.0000 mL | Freq: Two times a day (BID) | ORAL | Status: DC
  Administered 2022-10-06 (×2): 237 mL via ORAL
  Filled 2022-10-05 (×6): qty 237

## 2022-10-05 MED ORDER — DEXAMETHASONE SODIUM PHOSPHATE 10 MG/ML IJ SOLN
INTRAMUSCULAR | Status: AC
  Filled 2022-10-05: qty 1

## 2022-10-05 MED ORDER — MIDAZOLAM HCL 2 MG/2ML IJ SOLN
INTRAMUSCULAR | Status: AC
  Filled 2022-10-05: qty 2

## 2022-10-05 MED ORDER — DICYCLOMINE HCL 20 MG PO TABS: 20.0000 mg | ORAL_TABLET | Freq: Four times a day (QID) | ORAL | Status: DC | PRN

## 2022-10-05 MED ORDER — ONDANSETRON HCL 4 MG/2ML IJ SOLN
INTRAMUSCULAR | Status: AC
  Filled 2022-10-05: qty 2

## 2022-10-05 MED ORDER — FENTANYL CITRATE (PF) 250 MCG/5ML IJ SOLN
INTRAMUSCULAR | Status: DC | PRN
  Administered 2022-10-05: 25 ug via INTRAVENOUS
  Administered 2022-10-05: 50 ug via INTRAVENOUS
  Administered 2022-10-05: 25 ug via INTRAVENOUS

## 2022-10-05 SURGICAL SUPPLY — 61 items
APL PRP STRL LF DISP 70% ISPRP (MISCELLANEOUS)
BAG COUNTER SPONGE SURGICOUNT (BAG) ×1 IMPLANT
BAG SPNG CNTER NS LX DISP (BAG) ×1
BLADE SURG 15 STRL LF DISP TIS (BLADE) ×1 IMPLANT
BLADE SURG 15 STRL SS (BLADE) ×1
BLADE SURG SZ10 CARB STEEL (BLADE) IMPLANT
BNDG CMPR 5X3 KNIT ELC UNQ LF (GAUZE/BANDAGES/DRESSINGS)
BNDG CMPR 5X4 KNIT ELC UNQ LF (GAUZE/BANDAGES/DRESSINGS) ×2
BNDG CMPR 9X4 STRL LF SNTH (GAUZE/BANDAGES/DRESSINGS)
BNDG CMPR MD 5X2 ELC HKLP STRL (GAUZE/BANDAGES/DRESSINGS) ×1
BNDG ELASTIC 2 VLCR STRL LF (GAUZE/BANDAGES/DRESSINGS) IMPLANT
BNDG ELASTIC 3INX 5YD STR LF (GAUZE/BANDAGES/DRESSINGS) ×1 IMPLANT
BNDG ELASTIC 4INX 5YD STR LF (GAUZE/BANDAGES/DRESSINGS) ×1 IMPLANT
BNDG ESMARK 4X9 LF (GAUZE/BANDAGES/DRESSINGS) IMPLANT
BNDG GAUZE DERMACEA FLUFF 4 (GAUZE/BANDAGES/DRESSINGS) ×1 IMPLANT
BNDG GZE DERMACEA 4 6PLY (GAUZE/BANDAGES/DRESSINGS) ×1
CHLORAPREP W/TINT 26 (MISCELLANEOUS) IMPLANT
CNTNR URN SCR LID CUP LEK RST (MISCELLANEOUS) IMPLANT
CONT SPEC 4OZ STRL OR WHT (MISCELLANEOUS)
COVER BACK TABLE 60X90IN (DRAPES) ×1 IMPLANT
CUFF TOURN SGL QUICK 18X4 (TOURNIQUET CUFF) IMPLANT
CUFF TOURN SGL QUICK 24 (TOURNIQUET CUFF)
CUFF TRNQT CYL 24X4X16.5-23 (TOURNIQUET CUFF) IMPLANT
DRAPE 3/4 80X56 (DRAPES) ×1 IMPLANT
DRAPE EXTREMITY T 121X128X90 (DISPOSABLE) ×1 IMPLANT
DRAPE SHEET LG 3/4 BI-LAMINATE (DRAPES) ×1 IMPLANT
DRAPE U-SHAPE 47X51 STRL (DRAPES) ×1 IMPLANT
ELECT REM PT RETURN 15FT ADLT (MISCELLANEOUS) ×1 IMPLANT
GAUZE PACKING IODOFORM 1/2INX (GAUZE/BANDAGES/DRESSINGS) IMPLANT
GAUZE SPONGE 4X4 12PLY STRL (GAUZE/BANDAGES/DRESSINGS) ×1 IMPLANT
GAUZE XEROFORM 1X8 LF (GAUZE/BANDAGES/DRESSINGS) IMPLANT
GAUZE XEROFORM 5X9 LF (GAUZE/BANDAGES/DRESSINGS) IMPLANT
GLOVE BIO SURGEON STRL SZ7.5 (GLOVE) ×1 IMPLANT
GLOVE BIOGEL PI IND STRL 8 (GLOVE) ×1 IMPLANT
GOWN STRL REUS W/ TWL XL LVL3 (GOWN DISPOSABLE) ×1 IMPLANT
GOWN STRL REUS W/TWL XL LVL3 (GOWN DISPOSABLE) ×1
KIT BASIN OR (CUSTOM PROCEDURE TRAY) ×1 IMPLANT
KIT TURNOVER KIT A (KITS) IMPLANT
MANIFOLD NEPTUNE II (INSTRUMENTS) ×1 IMPLANT
NDL HYPO 25X1 1.5 SAFETY (NEEDLE) ×1 IMPLANT
NEEDLE HYPO 25X1 1.5 SAFETY (NEEDLE) IMPLANT
NS IRRIG 1000ML POUR BTL (IV SOLUTION) IMPLANT
PADDING CAST ABS COTTON 4X4 ST (CAST SUPPLIES) ×1 IMPLANT
SET INTERPULSE LAVAGE W/TIP (ORTHOPEDIC DISPOSABLE SUPPLIES) IMPLANT
SET IRRIG Y TYPE TUR BLADDER L (SET/KITS/TRAYS/PACK) IMPLANT
SPONGE T-LAP 4X18 ~~LOC~~+RFID (SPONGE) ×1 IMPLANT
STAPLER VISISTAT 35W (STAPLE) IMPLANT
STOCKINETTE 6 STRL (DRAPES) ×1 IMPLANT
SUCTION TUBE FRAZIER 10FR DISP (SUCTIONS) IMPLANT
SUT ETHILON 3 0 PS 1 (SUTURE) IMPLANT
SUT ETHILON 4 0 PS 2 18 (SUTURE) IMPLANT
SUT MNCRL AB 3-0 PS2 18 (SUTURE) IMPLANT
SUT MNCRL AB 4-0 PS2 18 (SUTURE) IMPLANT
SUT VIC AB 2-0 SH 27 (SUTURE)
SUT VIC AB 2-0 SH 27XBRD (SUTURE) IMPLANT
SWAB CULTURE ESWAB REG 1ML (MISCELLANEOUS) IMPLANT
SYR BULB EAR ULCER 3OZ GRN STR (SYRINGE) ×1 IMPLANT
SYR CONTROL 10ML LL (SYRINGE) ×1 IMPLANT
TUBE IRRIGATION SET MISONIX (TUBING) IMPLANT
UNDERPAD 30X36 HEAVY ABSORB (UNDERPADS AND DIAPERS) ×1 IMPLANT
YANKAUER SUCT BULB TIP NO VENT (SUCTIONS) ×1 IMPLANT

## 2022-10-05 NOTE — Anesthesia Procedure Notes (Signed)
Procedure Name: Intubation Date/Time: 10/05/2022 10:29 AM  Performed by: Oletha Cruel, CRNAPre-anesthesia Checklist: Patient identified, Emergency Drugs available, Suction available and Patient being monitored Patient Re-evaluated:Patient Re-evaluated prior to induction Oxygen Delivery Method: Circle system utilized Preoxygenation: Pre-oxygenation with 100% oxygen Induction Type: IV induction Ventilation: Mask ventilation without difficulty Laryngoscope Size: Glidescope Grade View: Grade I Tube type: Oral Number of attempts: 1 Airway Equipment and Method: Video-laryngoscopy Placement Confirmation: ETT inserted through vocal cords under direct vision, positive ETCO2, CO2 detector and breath sounds checked- equal and bilateral Secured at: 23 cm Tube secured with: Tape Dental Injury: Teeth and Oropharynx as per pre-operative assessment  Comments: Glidescope intubation performed to avoid midline tongue ring in patient's mouth.Tongue ring avoided intact.

## 2022-10-05 NOTE — Progress Notes (Signed)
Initial Nutrition Assessment  DOCUMENTATION CODES:   Not applicable  INTERVENTION:  - Add Glucerna Shake po BID, each supplement provides 220 kcal and 10 grams of protein  - Add -1 packet Juven BID, each packet provides 95 calories, 2.5 grams of protein (collagen), and 9.8 grams of carbohydrate (3 grams sugar); also contains 7 grams of L-arginine and L-glutamine, 300 mg vitamin C, 15 mg vitamin E, 1.2 mcg vitamin B-12, 9.5 mg zinc, 200 mg calcium, and 1.5 g  Calcium Beta-hydroxy-Beta-methylbutyrate to support wound healing  NUTRITION DIAGNOSIS:   Increased nutrient needs related to wound healing as evidenced by estimated needs.  GOAL:   Patient will meet greater than or equal to 90% of their needs  MONITOR:   PO intake, Supplement acceptance  REASON FOR ASSESSMENT:   Consult Wound healing  ASSESSMENT:   32 y.o. female admits related to leg swelling. PMH includes: acute H. Pylori gastric ulcer, DM, gastroparesis, DKA, GERD, HTN, hyperthyroidism, normocytic anemia. Pt is currently receiving medical management related to sepsis due to cellulitis.  Meds reviewed:  sliding scale insulin, reglan. Labs reviewed: Na low, BUN/Creatinine high.   RD attempted to call pt's room but no answer. Per record, pt ate 100% of her lunch this am. Pt underwent irrigation and debridement with incision and drainage of bilateral feet. Pt with increased nutrient needs related to wound healing. RD will add supplements and continue to monitor PO intakes.   NUTRITION - FOCUSED PHYSICAL EXAM:  Remote assessment.  Diet Order:   Diet Order             Diet Carb Modified Fluid consistency: Thin; Room service appropriate? Yes  Diet effective now                   EDUCATION NEEDS:   Not appropriate for education at this time  Skin:  Skin Assessment: Skin Integrity Issues: Skin Integrity Issues:: Other (Comment) Other: Cellulitis of bilateral feet  Last BM:  8/18 - type 7  Height:   Ht  Readings from Last 1 Encounters:  10/03/22 5\' 7"  (1.702 m)    Weight:   Wt Readings from Last 1 Encounters:  10/03/22 84.8 kg    Ideal Body Weight:     BMI:  Body mass index is 29.28 kg/m.  Estimated Nutritional Needs:   Kcal:  2300-2600 kcals  Protein:  120-145 gm  Fluid:  >/= 2.3 L  Bethann Humble, RD, LDN, CNSC.

## 2022-10-05 NOTE — Progress Notes (Signed)
PICC order received. Current plan is to place on 8/19, Discussed with Amil Amen, RN.

## 2022-10-05 NOTE — Transfer of Care (Signed)
Immediate Anesthesia Transfer of Care Note  Patient: Kelli Hudson  Procedure(s) Performed: IRRIGATION AND DEBRIDEMENT FOOT (Bilateral: Foot)  Patient Location: PACU  Anesthesia Type:General  Level of Consciousness: awake, drowsy, and patient cooperative  Airway & Oxygen Therapy: Patient Spontanous Breathing and Patient connected to face mask oxygen  Post-op Assessment: Report given to RN and Post -op Vital signs reviewed and stable  Post vital signs: Reviewed and stable  Last Vitals:  Vitals Value Taken Time  BP 121/68 10/05/22 1130  Temp    Pulse 97 10/05/22 1140  Resp 11 10/05/22 1140  SpO2 96 % 10/05/22 1140  Vitals shown include unfiled device data.  Last Pain:  Vitals:   10/05/22 1130  TempSrc:   PainSc: Asleep      Patients Stated Pain Goal: 5 (10/05/22 0647)  Complications: No notable events documented.

## 2022-10-05 NOTE — Progress Notes (Signed)
PROGRESS NOTE Kelli Hudson  TDV:[id number] DOB: 12/20/1990 DOA: 10/03/2022 PCP: Kelli Salt, PA  Brief Narrative/Hospital Course: 32 y.o. female with medical history significant of DM1, diabetic gastroparesis with very frequent admission in the past, HTN presented w/ c/o B foot wounds, fever, and pain and informed that she has been dealing with lower extremity wound for past 2 weeks ,followed with her podiatrist in the outpatient setting at [facility] there which she recently finished a course of doxycycline which did not help her symptoms, initially had a blister that ruptured subsequently with redness streaking up her legs and noticing fever chills worsening pain in last few days.  In the ED Tmax 102.5,  HR upto 115,BP soft, not hypoxic.  Labs with significant leukocytosis 13.3, lactic acid normal 0.9, hyponatremia 131 creatinine 1.6 bicarb 18 normal LFTs and lipase, hemoglobin up to 6.4 g blood sugar up to 246. On imaging with chest x-ray, x-rays of right and left foot-erosions noted no osseous changes  Patient admitted for sepsis due to cellulitis/lower extremity wound, severe anemia, AKI. Podiatry consulted Unable to complete MRI as gastric pacemaker is not MRI safe 8/18: Taken to the OR for irrigation debridement with incision and drainage of bilateral feet   Subjective: Seen and examined Overnight blood sugar stayed stable Yesterday she did not have IV access Femoral line was placed 8/17 pm  as iv access could be be secure in the setting of acute hypoglycemic >placed on IV dextrose Patient underwent irrigation debridement of bilateral feet abscess and ulcer this morning and just back in her room Overnight she had temp up to 100.5 Labs pending this morning subsequently resulted with WBC peaked to 42.2 hemoglobin down to 7.1, creatinine slightly better at 1.6 bicarb 18  Assessment and Plan: Principal Problem:   Sepsis due to cellulitis (HCC) Active Problems:    AKI (acute kidney injury) (HCC)   Cellulitis of lower extremity   Anemia   Diabetic gastroparesis associated with type 1 diabetes mellitus (HCC)   DM type 1 (diabetes mellitus, type 1) (HCC)   Drug-seeking behavior  Bilateral feet abscess and cellulitis  Sepsis POA due to above : Patient with leukocytosis otherwise BP stable, having low-grade temp. X-ray feets no bony changes, ABI unremarkable. MRI could not be completed as gastric pacemaker was not MRI 7, continued on broad-spectrum antibiotic with vancomycin and Flagyl cefepime.  Podiatry following closely underwent incision drainage and debridement this morning follow-up culture data.  Noted bump in leukocytosis. Will need ID consult once culture resulted. Recent Labs  Lab 10/03/22 1354 10/04/22 0409 10/05/22 0835  WBC 31.3* 26.9* 42.2*  LATICACIDVEN 0.9  --   --     AKI on CKD 3a with metabolic acidosis Baseline creatinine seems to be around 1.19-1.3 back in March 2024. Likely in the setting of sepsis and wound infection.Holding antihypertensive avoid nephrotoxic medication, avoid hypotension.  Monitor intake output and trend labs, and gentle IV fluid hydration Recent Labs    04/18/22 0647 04/20/22 0857 05/05/22 1305 05/06/22 0317 05/07/22 0601 05/08/22 0532 05/09/22 0612 10/03/22 1354 10/04/22 0409 10/05/22 0835  BUN 18 13 23* 21* 26* 27* 26* 31* 31* 22*  CREATININE 1.26* 1.52* 1.42* 1.07* 1.72* 1.30* 1.19* 1.64* 1.76* 1.61*  CO2 24 31 25  20* 21* 20* 19* 18* 19* 18*    Anemia No obvious stigmata of GI bleeding, Hemoccult was negative.  Likely in the setting of chronic disease, infection.   Patient will need PRBC admission hemoglobin downtrending in the  setting of postop and active infection transfuse 1 more unit PRBC today.  Recent anemia panel reviewed-stable as below Recent Labs    03/24/22 0553 03/25/22 0543 10/03/22 1354 10/03/22 2029 10/04/22 0409 10/04/22 1200 10/05/22 0835  HGB 10.1*   < > 6.4* 6.3* 6.8*  9.1* 7.1*  MCV 93.9   < > 89.0  --  93.9  --  90.2  VITAMINB12 771  --   --   --   --   --   --   FOLATE 12.0  --   --   --   --   --   --   FERRITIN 18  --   --   --   --   --   --   TIBC 417  --   --   --   --   --   --   IRON 78  --   --   --   --   --   --   RETICCTPCT 2.3  --   --   --   --   --   --    < > = values in this interval not displayed.    Thrombocytosis: likely in the setting of infection.Monitor  Type 1 diabetes mellitus w/ hypoglycemia/uncontrolled hyperglycemia: Likely she has brittle diabetes. Reports taking 8 units insulin twice a day, will hold off on long-acting insulin due to hypoglycemia.  Keep on low SSI, keep on D5 NS due to hypoglycemia and monitor. Recent Labs  Lab 10/05/22 0136 10/05/22 0439 10/05/22 0724 10/05/22 0953 10/05/22 1132  GLUCAP 209* 148* 133* 111* 96    Diabetic gastroparesis s/p gastric pacemaker Recurrent admission for intractable nausea vomiting: Continue scheduled Reglan 10mg  3 times daily,  prn zofraran, last QTc was normal on 8/16 admission. Mother endorsed that patient is in the hospital 3 out of 7 days due to gastroparesis and followed by specialist in [place]. Continue symptomatic management.  Diet as tolerated.  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 Today however, pt is not being admitted for chronic abd pain associated with diabetic gastroparesis, instead this patient clearly has an acute medical condition causing acute pain this admission (see photos of feet above). Placed on- PRN IV dilaudid if needed this admission for severe pain  Diarrhea: Checking c diff/GIP. Cont enteric precautions until then  Difficult IV accecss: Rt Femoral line palced 10/04/22 pm urgently due to no ivaccess while hypoglycemis PICC line ordered and remove femoral line soon after picc.  DVT prophylaxis: Place and maintain  sequential compression device Start: 10/04/22 0825 Code Status:   Code Status: Full Code Family Communication: plan of care discussed with patient and her mother at bedside. Patient status is: Inpatient because of sepsis/leg wound Level of care: Progressive   Dispo: The patient is from: home w/ boyfriend and is disabled.            Anticipated disposition: TBD Objective: Vitals last 24 hrs: Vitals:   10/05/22 0948 10/05/22 1130 10/05/22 1145 10/05/22 1200  BP: 118/75 121/68 106/83 (!) 104/49  Pulse: (!) 119 (!) 103 97 99  Resp: 13 10 10 16   Temp: 99.2 F (37.3 C) 99 F (37.2 C) 97.8 F (36.6 C)   TempSrc:      SpO2: 98% 100% 96% 95%  Weight:      Height:       Weight change:   Physical Examination:  General exam: alert awake, oriented, just used bedside commode HEENT:Oral mucosa moist, Ear/Nose WNL grossly Respiratory system: Bilaterally clear BS,no use of accessory muscle Cardiovascular system: S1 & S2 +, No JVD. Gastrointestinal system: Abdomen soft,NT,ND, BS+ Nervous System: Alert, awake, moving all extremities,and following commands. Extremities: LE foot in dressing post op, rt femoral line + w/ dressing intact Skin: No rashes,no icterus. MSK: Normal muscle bulk,tone, power   Medications reviewed:  Scheduled Meds:  [MAR Hold] sodium chloride   Intravenous Once   sodium chloride   Intravenous Once   [MAR Hold] Chlorhexidine Gluconate Cloth  6 each Topical Daily   [MAR Hold] insulin aspart  0-9 Units Subcutaneous TID WC   [MAR Hold] metoCLOPramide (REGLAN) injection  10 mg Intravenous Q8H   [MAR Hold] silver sulfADIAZINE   Topical Daily   [MAR Hold] sodium chloride flush  10-40 mL Intracatheter Q12H   Continuous Infusions:  [MAR Hold] ceFEPime (MAXIPIME) IV 2 g (10/04/22 2346)   dextrose 5 % and 0.9 % NaCl 75 mL/hr at 10/05/22 0807   [MAR Hold] metronidazole 500 mg (10/05/22 0428)   [MAR Hold] vancomycin 1,250 mg (10/04/22 2003)     Diet Order              Diet NPO time specified Except for: Sips with Meds  Diet effective now                  Intake/Output Summary (Last 24 hours) at 10/05/2022 1233 Last data filed at 10/05/2022 1124 Gross per 24 hour  Intake 2530 ml  Output 1710 ml  Net 820 ml   Net IO Since Admission: 3,273.33 mL [10/05/22 1233]  Wt Readings from Last 3 Encounters:  10/03/22 84.8 kg  06/18/22 83.5 kg  06/16/22 83.9 kg     Unresulted Labs (From admission, onward)     Start     Ordered   10/05/22 1049  Aerobic/Anaerobic Culture w Gram Stain (surgical/deep wound)  RELEASE UPON ORDERING,   TIMED       Comments: Specimen A: Pre-op diagnosis: Bilateral foot ulcers    10/05/22 1049   10/05/22 0715  C Difficile Quick Screen w PCR reflex  (C Difficile quick screen w PCR reflex panel )  Once, for 24 hours,   TIMED       References:    CDiff Information Tool   10/05/22 0714   10/05/22 0500  Basic metabolic panel  Daily,   R      10/04/22 0818   10/05/22 0500  CBC  Daily,   R      10/04/22 0818          Data Reviewed: I have personally reviewed following labs and imaging studies CBC: Recent Labs  Lab 10/03/22 1354 10/03/22 2029 10/04/22 0409 10/04/22 1200 10/05/22 0835  WBC 31.3*  --  26.9*  --  42.2*  NEUTROABS 27.8*  --   --   --   --   HGB 6.4* 6.3* 6.8* 9.1* 7.1*  HCT 20.2* 19.6* 21.6* 30.5* 22.2*  MCV 89.0  --  93.9  --  90.2  PLT 566*  --  724*  --  558*   Basic Metabolic Panel: Recent Labs  Lab 10/03/22 1354 10/04/22 0409 10/05/22 0835  NA 130* 133* 133*  K 4.2 3.8 3.7  CL 103 106 107  CO2 18* 19* 18*  GLUCOSE 246* 160* 126*  BUN 31* 31* 22*  CREATININE 1.64* 1.76* 1.61*  CALCIUM 8.2* 8.3* 7.7*  GFR: Estimated Creatinine Clearance: 56.7 mL/min (A) (by C-G formula based on SCr of 1.61 mg/dL (H)). Liver Function Tests: Recent Labs  Lab 10/03/22 1354  AST 11*  ALT 8  ALKPHOS 108  BILITOT 0.3  PROT 6.2*  ALBUMIN 2.1*   Recent Labs  Lab 10/03/22 1354  LIPASE 11   Recent  Results (from the past 240 hour(s))  Culture, blood (Routine x 2)     Status: None (Preliminary result)   Collection Time: 10/03/22  1:52 PM   Specimen: BLOOD RIGHT ARM  Result Value Ref Range Status   Specimen Description   Final    BLOOD RIGHT ARM Performed at [facility], [address] [place], [place] [address]    Special Requests   Final    BOTTLES DRAWN AEROBIC AND ANAEROBIC Blood Culture adequate volume Performed at [facility], [address] [place], [place] [address]    Culture   Final    NO GROWTH 2 DAYS Performed at [facility], [address], [place], [place] [address]    Report Status PENDING  Incomplete  SARS Coronavirus 2 by RT PCR (hospital order, performed in [facility] hospital lab) *cepheid single result test* Anterior Nasal Swab     Status: None   Collection Time: 10/03/22  1:54 PM   Specimen: Anterior Nasal Swab  Result Value Ref Range Status   SARS Coronavirus 2 by RT PCR NEGATIVE NEGATIVE Final    Comment: (NOTE) SARS-CoV-2 target nucleic acids are NOT DETECTED.  The SARS-CoV-2 RNA is generally detectable in upper and lower respiratory specimens during the acute phase of infection. The lowest concentration of SARS-CoV-2 viral copies this assay can detect is 250 copies / mL. A negative result does not preclude SARS-CoV-2 infection and should not be used as the sole basis for treatment or other patient management decisions.  A negative result may occur with improper specimen collection / handling, submission of specimen other than nasopharyngeal swab, presence of viral mutation(s) within the areas targeted by this assay, and inadequate number of viral copies (<250 copies / mL). A negative result must be combined with clinical observations, patient history, and epidemiological information.  Fact Sheet for Patients:   RoadLapTop.co.za  Fact Sheet for Healthcare  Providers: http://kim-miller.com/  This test is not yet approved or  cleared by the [place] FDA and has been authorized for detection and/or diagnosis of SARS-CoV-2 by FDA under an Emergency Use Authorization (EUA).  This EUA will remain in effect (meaning this test can be used) for the duration of the COVID-19 declaration under Section 564(b)(1) of the Act, 21 U.S.C. section 360bbb-3(b)(1), unless the authorization is terminated or revoked sooner.  Performed at [facility], [address] [place], [place] [address]     Antimicrobials: Anti-infectives (From admission, onward)    Start     Dose/Rate Route Frequency Ordered Stop   10/04/22 1600  [MAR Hold]  vancomycin (VANCOREADY) IVPB 1250 mg/250 mL        (MAR Hold since Sun 10/05/2022 at 0943.Hold Reason: Transfer to a Procedural area)   1,250 mg 166.7 mL/hr over 90 Minutes Intravenous Every 24 hours 10/03/22 2312     10/04/22 0000  [MAR Hold]  ceFEPIme (MAXIPIME) 2 g in sodium chloride 0.9 % 100 mL IVPB        (MAR Hold since Sun 10/05/2022 at 0943.Hold Reason: Transfer to a Procedural area)   2 g 200 mL/hr over 30 Minutes Intravenous Every  12 hours 10/03/22 2312     10/03/22 1530  vancomycin (VANCOCIN) 500 mg in sodium chloride 0.9 % 100 mL IVPB        500 mg 100 mL/hr over 60 Minutes Intravenous  Once 10/03/22 1418 10/03/22 2020   10/03/22 1430  vancomycin (VANCOCIN) IVPB 1000 mg/200 mL premix        1,000 mg 200 mL/hr over 60 Minutes Intravenous  Once 10/03/22 1417 10/03/22 1830   10/03/22 1415  [MAR Hold]  metroNIDAZOLE (FLAGYL) IVPB 500 mg        (MAR Hold since Sun 10/05/2022 at 0943.Hold Reason: Transfer to a Procedural area)   500 mg 100 mL/hr over 60 Minutes Intravenous Every 12 hours 10/03/22 1413 10/10/22 1659   10/03/22 1415  ceFEPIme (MAXIPIME) 2 g in sodium chloride 0.9 % 100 mL IVPB        2 g 200 mL/hr over 30 Minutes Intravenous  Once 10/03/22 1417 10/03/22 1530       Culture/Microbiology    Component Value Date/Time   SDES  10/03/2022 1352    BLOOD RIGHT ARM Performed at [facility], [address] [place], [place] [address]    [facility]  10/03/2022 1352    BOTTLES DRAWN AEROBIC AND ANAEROBIC Blood Culture adequate volume Performed at [facility], [address] [place], [place] [address]    CULT  10/03/2022 1352    NO GROWTH 2 DAYS Performed at [facility], [address], [place], [place] [address]    REPTSTATUS PENDING 10/03/2022 1352    Other culture-see note  Radiology Studies: [place] EKG SITE RITE  Result Date: 10/05/2022 If Site Rite image not attached, placement could not be confirmed due to current cardiac rhythm.  DG CHEST PORT 1 VIEW  Result Date: 10/04/2022 CLINICAL DATA:  Dyspnea EXAM: PORTABLE CHEST 1 VIEW COMPARISON:  Chest x-ray 10/03/2022 FINDINGS: The heart size and mediastinal contours are within normal limits. Both lungs are clear. The visualized skeletal structures are unremarkable. IMPRESSION: No active disease. Electronically Signed   By: Darliss Cheney M.D.   On: 10/04/2022 22:53   DG Abd 1 View  Result Date: 10/04/2022 CLINICAL DATA:  Central line EXAM: ABDOMEN - 1 VIEW COMPARISON:  None Available. FINDINGS: Right femoral approach central venous catheter tip projects over the expected region of the right common iliac vein/IVC junction. Bowel-gas pattern is nonobstructive. Generator overlies the left abdomen. No suspicious calcifications are seen. Lung bases are clear. Osseous structures are within normal limits. IMPRESSION: Right femoral approach central venous catheter tip projects over the expected region of the right common iliac vein/IVC junction. Electronically Signed   By: Darliss Cheney M.D.   On: 10/04/2022 19:47   VAS [place] ABI WITH/WO TBI  Result Date: 10/04/2022  LOWER EXTREMITY DOPPLER STUDY Patient Name:  Kelli Hudson  Date of Exam:   10/04/2022 Medical Rec #:  [id number]              Accession #:    [id number] Date of Birth: 1990-10-04              Patient Gender: F Patient Age:   84 years Exam [facility]  [facility] Procedure:      VAS [place] ABI WITH/WO TBI Referring Phys: Lyda Perone --------------------------------------------------------------------------------  Indications: Bilateral foot wounds High Risk Factors: Hyperlipidemia, Diabetes, no history of smoking. 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  Comparison Study: Previous exam 03/13/2021 Performing Technologist: Ernestene Mention RVT, RDMS  Examination Guidelines: A complete evaluation includes at minimum, Doppler waveform signals and systolic blood pressure reading at the level of bilateral brachial, anterior tibial, and posterior tibial arteries, when vessel segments are accessible. Bilateral testing is considered an integral part of a complete examination. Photoelectric Plethysmograph (PPG) waveforms and toe systolic pressure readings are included as required and additional duplex testing as needed. Limited examinations for reoccurring indications may be performed as noted.  ABI Findings: +---------+------------------+-----+---------+--------+ Right    Rt Pressure (mmHg)IndexWaveform Comment  +---------+------------------+-----+---------+--------+ Brachial 145                    triphasic         +---------+------------------+-----+---------+--------+ PTA      198               1.21 triphasic         +---------+------------------+-----+---------+--------+ DP       179               1.10 biphasic          +---------+------------------+-----+---------+--------+ Great Toe143               0.88 Normal            +---------+------------------+-----+---------+--------+ +---------+------------------+-----+---------+-------+ Left     Lt Pressure (mmHg)IndexWaveform Comment +---------+------------------+-----+---------+-------+ Brachial 163                    triphasic         +---------+------------------+-----+---------+-------+ PTA      185               1.13 triphasic        +---------+------------------+-----+---------+-------+ DP       183               1.12 triphasic        +---------+------------------+-----+---------+-------+ Great Toe129               0.79 Normal           +---------+------------------+-----+---------+-------+ +-------+-----------+-----------+------------+------------+ ABI/TBIToday's ABIToday's TBIPrevious ABIPrevious TBI +-------+-----------+-----------+------------+------------+ Right  1.21       0.88       1.12        0.91         +-------+-----------+-----------+------------+------------+ Left   1.13       0.79       1.18        1.03         +-------+-----------+-----------+------------+------------+  >15 mmHG between brachial pressure despite multiple readings.  Summary: Right: Resting right ankle-brachial index is within normal range. The right toe-brachial index is normal. Left: Resting left ankle-brachial index is within normal range. The left toe-brachial index is normal. *See table(s) above for measurements and observations.  Electronically signed by Lemar Livings MD on 10/04/2022 at 11:28:17 AM.    Final    DG Foot Complete Left  Result Date: 10/03/2022 CLINICAL DATA:  Bilateral foot pain EXAM: LEFT FOOT - COMPLETE 3+ VIEW COMPARISON:  09/25/2022, 11/12/2021 FINDINGS: No acute fracture or dislocation. No aggressive osseous lesion. Normal alignment. 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 Amputation of the fourth proximal,, middle and distal phalanx. Soft tissue are unremarkable. No radiopaque foreign body or soft tissue emphysema. Peripheral vascular atherosclerotic disease. IMPRESSION: 1.  No acute osseous injury of the left foot. Electronically Signed   By: Elige Ko M.D.   On: 10/03/2022 16:17   DG Foot Complete Right  Result Date: 10/03/2022  CLINICAL DATA:   Right foot pain EXAM: RIGHT FOOT COMPLETE - 3+ VIEW COMPARISON:  09/25/2022, 09/26/2017 FINDINGS: No acute fracture or dislocation. No aggressive osseous lesion. Normal alignment. Marginal erosion along the medial aspect of the second, third and fourth metatarsal heads as can be seen with an inflammatory or crystalline arthropathy. 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 Soft tissue edema along the dorsal aspect of the foot and around the distal lower leg which may be reactive versus secondary to cellulitis. No radiopaque foreign body or soft tissue emphysema. IMPRESSION: 1.  No acute osseous injury of the right foot. 2. Marginal erosion along the medial aspect of the second, third and fourth metatarsal heads as can be seen with an inflammatory or crystalline arthropathy. Electronically Signed   By: Elige Ko M.D.   On: 10/03/2022 16:15   DG Chest 2 View  Result Date: 10/03/2022 CLINICAL DATA:  Suspected Sepsis EXAM: CHEST - 2 VIEW COMPARISON:  None Available. FINDINGS: No pleural effusion. No pneumothorax. No focal airspace opacity. Normal cardiac and mediastinal contours. No radiographically displaced rib fractures. Visualized upper abdomen is possible. Vertebral body heights are maintained. IMPRESSION: No focal airspace opacity Electronically Signed   By: Lorenza Cambridge M.D.   On: 10/03/2022 14:13     LOS: 2 days   Lanae Boast, MD [facility]  10/05/2022, 12:33 PM

## 2022-10-05 NOTE — Plan of Care (Signed)
  Problem: Education: Goal: Knowledge of General Education information will improve Description: Including pain rating scale, medication(s)/side effects and non-pharmacologic comfort measures Outcome: Progressing   Problem: Clinical Measurements: Goal: Diagnostic test results will improve Outcome: Progressing   Problem: Nutrition: Goal: Adequate nutrition will be maintained Outcome: Progressing   Problem: Pain Managment: Goal: General experience of comfort will improve Outcome: Progressing   Problem: Safety: Goal: Ability to remain free from injury will improve Outcome: Progressing

## 2022-10-05 NOTE — Anesthesia Postprocedure Evaluation (Signed)
Anesthesia Post Note  Patient: Network engineer  Procedure(s) Performed: IRRIGATION AND DEBRIDEMENT FOOT (Bilateral: Foot)     Patient location during evaluation: PACU Anesthesia Type: General Level of consciousness: awake and alert Pain management: pain level controlled Vital Signs Assessment: post-procedure vital signs reviewed and stable Respiratory status: spontaneous breathing, nonlabored ventilation, respiratory function stable and patient connected to nasal cannula oxygen Cardiovascular status: blood pressure returned to baseline and stable Postop Assessment: no apparent nausea or vomiting Anesthetic complications: no  No notable events documented.  Last Vitals:  Vitals:   10/05/22 1215 10/05/22 1239  BP: (!) 115/49 116/74  Pulse: (!) 102 99  Resp: 11 17  Temp: 37.2 C 37.1 C  SpO2: 100% 95%    Last Pain:  Vitals:   10/05/22 1325  TempSrc:   PainSc: Asleep                 Evonda Enge L Aliayah Tyer

## 2022-10-05 NOTE — Brief Op Note (Signed)
10/05/2022  11:34 AM  PATIENT:  [name]  32 y.o. female  PRE-OPERATIVE DIAGNOSIS:  Bilateral foot ulcers  POST-OPERATIVE DIAGNOSIS:  Bilateral foot ulcers  PROCEDURE:  Procedure(s): IRRIGATION AND DEBRIDEMENT FOOT (Bilateral)  SURGEON:  Surgeons and Role:    [name], DPM - Primary  PHYSICIAN ASSISTANT: none  ASSISTANTS: none   ANESTHESIA:   general  EBL:  10 mL   BLOOD ADMINISTERED:none  DRAINS: none   LOCAL MEDICATIONS USED:  NONE  SPECIMEN:  Source of Specimen:  Soft tissue culture left foot  DISPOSITION OF SPECIMEN:  PATHOLOGY  COUNTS:  YES  TOURNIQUET: None  DICTATION: .Dragon Dictation  PLAN OF CARE: Admit to inpatient   PATIENT DISPOSITION:  PACU - hemodynamically stable.   Delay start of Pharmacological VTE agent (>24hrs) due to surgical blood loss or risk of bleeding: no  [name], DPM [facility]  Dr. [name], DPM    [address] [place], [place] [address]                Office [phone]  Fax [phone]

## 2022-10-05 NOTE — Anesthesia Preprocedure Evaluation (Addendum)
Anesthesia Evaluation  Patient identified by MRN, date of birth, ID band Patient awake    Reviewed: Allergy & Precautions, NPO status , Patient's Chart, lab work & pertinent test results, reviewed documented beta blocker date and time   Airway Mallampati: I  TM Distance: >3 FB Neck ROM: Full   Comment: Pt has tongue piercing that she says cannot be removed. States has had many surgeries with it in place. Counseled on risks of the piercing staying in place. Pt understands and would like to proceed with piercing in place.  Dental no notable dental hx. (+) Teeth Intact, Dental Advisory Given   Pulmonary neg pulmonary ROS   Pulmonary exam normal breath sounds clear to auscultation       Cardiovascular hypertension, Pt. on home beta blockers and Pt. on medications Normal cardiovascular exam Rhythm:Regular Rate:Normal     Neuro/Psych  PSYCHIATRIC DISORDERS Anxiety Depression    negative neurological ROS     GI/Hepatic Neg liver ROS, PUD,GERD  ,,Diabetic gastroparesis with gastric stimulator implant   Endo/Other  diabetes, Poorly Controlled, Type 1, Insulin Dependent Hyperthyroidism   Renal/GU Renal InsufficiencyRenal diseaseLab Results      Component                Value               Date                      NA                       133 (L)             10/04/2022                CL                       106                 10/04/2022                K                        3.8                 10/04/2022                CO2                      19 (L)              10/04/2022                BUN                      31 (H)              10/04/2022                CREATININE               1.76 (H)            10/04/2022                GFRNONAA                 39 (L)  10/04/2022                CALCIUM                  8.3 (L)             10/04/2022                PHOS                     4.3                 04/09/2022                 ALBUMIN                  2.1 (L)             10/03/2022                GLUCOSE                  160 (H)             10/04/2022             negative genitourinary   Musculoskeletal negative musculoskeletal ROS (+)    Abdominal   Peds  Hematology  (+) Blood dyscrasia, anemia Lab Results      Component                Value               Date                      WBC                      26.9 (H)            10/04/2022                HGB                      9.1 (L)             10/04/2022                HCT                      30.5 (L)            10/04/2022                MCV                      93.9                10/04/2022                PLT                      724 (H)             10/04/2022              Anesthesia Other Findings 32 y.o. female with medical history significant of DM1, diabetic gastroparesis with very frequent admission in the past, HTN presented w/ c/o B/l foot wounds, fever, and pain.  Reproductive/Obstetrics  Anesthesia Physical Anesthesia Plan  ASA: 3  Anesthesia Plan: General   Post-op Pain Management: Ofirmev IV (intra-op)*   Induction: Intravenous  PONV Risk Score and Plan: 3 and Ondansetron, Dexamethasone and Midazolam  Airway Management Planned: Oral ETT  Additional Equipment:   Intra-op Plan:   Post-operative Plan: Extubation in OR  Informed Consent: I have reviewed the patients History and Physical, chart, labs and discussed the procedure including the risks, benefits and alternatives for the proposed anesthesia with the patient or authorized representative who has indicated his/her understanding and acceptance.     Dental advisory given  Plan Discussed with: CRNA  Anesthesia Plan Comments:        Anesthesia Quick Evaluation

## 2022-10-05 NOTE — Interval H&P Note (Signed)
History and Physical Interval Note:  10/05/2022 10:21 AM  Kelli Hudson  has presented today for surgery, with the diagnosis of Bilateral foot ulcers.  The various methods of treatment have been discussed with the patient and family. After consideration of risks, benefits and other options for treatment, the patient has consented to  Procedure(s): IRRIGATION AND DEBRIDEMENT FOOT (Bilateral) as a surgical intervention.  The patient's history has been reviewed, patient examined, no change in status, stable for surgery.  I have reviewed the patient's chart and labs.  Questions were answered to the patient's satisfaction.     [name]

## 2022-10-05 NOTE — Progress Notes (Signed)
At this time, the patient has a Interior and spatial designer implanted. Manual states this device is MRI UNSAFE and should not be exposed to the MRI. Interaction of the stimulator with MRI may result in serious injury or death, system failure, dislodgement, heating, or induced voltages of the system and/or leads.  Patient is unable to undergo MRI due to the MRI safety status of this device.

## 2022-10-05 NOTE — Plan of Care (Signed)
  Problem: Education: Goal: Knowledge of General Education information will improve Description: Including pain rating scale, medication(s)/side effects and non-pharmacologic comfort measures Outcome: Progressing   Problem: Coping: Goal: Level of anxiety will decrease Outcome: Progressing   Problem: Safety: Goal: Ability to remain free from injury will improve Outcome: Progressing   

## 2022-10-05 NOTE — Progress Notes (Addendum)
MRI not yet performed on patient. Updated MRI orders to reflect presence of gastric pacemaker.  Also updated surgical section of her PMH. Presumably MRI tech / radiologist will need to evaluate if this is MRI conditional and/or needs device rep to come in and put in an MRI safe mode (assuming device is MRI conditional).  If device is not MRI conditional, then wouldn't be able to get MRI and may have to do 3 phase bone scan instead.  Would need to discuss with radiology.  I updated patient with the news, she will let her mother know she tells me.

## 2022-10-05 NOTE — Op Note (Signed)
OPERATIVE REPORT Patient name: Kelli Hudson MRN: [id number] DOB: 06-Oct-1990  DOS: 10/05/22  Preop Dx: Ulcer with abscess bilateral foot Postop Dx: same  Procedure:  1.  Irrigation and debridement with incision and drainage bilateral  Surgeon: [name] DPM  Anesthesia: General anesthesia  Hemostasis: None  EBL: 10 mL Materials: None Injectables: None Pathology: Soft tissue culture right foot  Condition: The patient tolerated the procedure and anesthesia well. No complications noted or reported   Justification for procedure: The patient is a 32 y.o. female who presents today for surgical correction of ulcers to the bilateral feet with progressive cellulitis and abscess. All conservative modalities of been unsuccessful in providing any sort of satisfactory alleviation of symptoms with the patient. The patient was told benefits as well as possible side effects of the surgery. The patient consented for surgical correction. The patient consent form was reviewed. All patient questions were answered. No guarantees were expressed or implied. The patient and the surgeon both signed the patient consent form with the witness present and placed in the patient's chart.   Procedure in Detail: The patient was brought to the operating room, placed in the operating table in the supine position at which time an aseptic scrub and drape were performed about the patient's respective lower extremity after anesthesia was induced as described above. Attention was then directed to the surgical area where procedure number one commenced.  Procedure #1: Irrigation debridement with incision and drainage bilateral foot ulcers RT foot: Attention was first directed to the right foot where excisional debridement of the necrotic nonviable portions of the ulcers was performed using a #10 scalpel in combination with curettage and rongeur.  Any necrotic nonviable tissue down to healthier bleeding viable  tissue was performed. After debridement there was a portion of the dorsum of the foot around the second intermetatarsal space which demonstrated tenting of the skin with underlying purulent abscess.  This area was perforated using blunt dissection with a curved mosquito and purulent drainage was expressed from this area.  The abscess extended down into the plantar compartments of the foot.  The abscess and most severe portions of the infection seem to be focused around the first and second intermetatarsal space of the foot.  Additional perforation of the skin using curved mosquito was utilized into the interdigital webspace between the great toe and the adjacent digit as well as two incisions along the plantar compartments of the foot to express the underlying abscess and purulent drainage.  Cultures were taken from the dorsal entry point of the abscess and sent to pathology for culture and sensitivity.  Copious irrigation using pulse lavage was utilized.  The incision sites were packed with half-inch iodoform packing.  LT foot: Attention was then directed to the left foot where again, excisional debridement of the necrotic nonviable portions of the ulcer was performed using a #10 scalpel in combination with curettage and rongeur down to healthier bleeding margins of tissue..  There did not appear to be any underlying abscess to the foot and there was not a significant amount of surrounding erythema as compared to the right foot.  Again, copious irrigation using pulse lavage was utilized.  No culture was taken on the left foot since the majority of the infection severity appeared to be mostly isolated to the right foot.  Betadine soaked Xeroform gauze was then applied to all ulcers bilateral followed by dry sterile compressive dressings.  The patient was then transferred from the operating room to  the recovery room having tolerated the procedure and anesthesia well. All vital signs are stable. After a brief  stay in the recovery room the patient was readmitted to inpatient room with postoperative orders placed.    IMPRESSION: Bilateral foot ulcers.  Underlying abscess right foot with the severity and the infection/abscess focused around the first and second intermetatarsal space.  [name], DPM [facility]  Dr. [name], DPM    [address] [place], [place] [address]                Office [phone]  Fax [phone]

## 2022-10-06 ENCOUNTER — Inpatient Hospital Stay (HOSPITAL_COMMUNITY): Payer: Medicaid Other

## 2022-10-06 ENCOUNTER — Encounter (HOSPITAL_COMMUNITY): Payer: Self-pay | Admitting: Podiatry

## 2022-10-06 DIAGNOSIS — L0390 Cellulitis, unspecified: Secondary | ICD-10-CM | POA: Diagnosis not present

## 2022-10-06 DIAGNOSIS — A419 Sepsis, unspecified organism: Secondary | ICD-10-CM | POA: Diagnosis not present

## 2022-10-06 LAB — BPAM RBC
Blood Product Expiration Date: 202409152359
Blood Product Expiration Date: 202409152359
ISSUE DATE / TIME: 202408170621
ISSUE DATE / TIME: 202408181733
Unit Type and Rh: 5100
Unit Type and Rh: 5100

## 2022-10-06 LAB — CBC
HCT: 28.2 % — ABNORMAL LOW (ref 36.0–46.0)
Hemoglobin: 8.9 g/dL — ABNORMAL LOW (ref 12.0–15.0)
MCH: 28.5 pg (ref 26.0–34.0)
MCHC: 31.6 g/dL (ref 30.0–36.0)
MCV: 90.4 fL (ref 80.0–100.0)
Platelets: 630 10*3/uL — ABNORMAL HIGH (ref 150–400)
RBC: 3.12 MIL/uL — ABNORMAL LOW (ref 3.87–5.11)
RDW: 15.9 % — ABNORMAL HIGH (ref 11.5–15.5)
WBC: 55 10*3/uL (ref 4.0–10.5)
nRBC: 0 % (ref 0.0–0.2)

## 2022-10-06 LAB — TYPE AND SCREEN
ABO/RH(D): O POS
Antibody Screen: NEGATIVE
Unit division: 0
Unit division: 0

## 2022-10-06 LAB — BASIC METABOLIC PANEL
Anion gap: 9 (ref 5–15)
BUN: 24 mg/dL — ABNORMAL HIGH (ref 6–20)
CO2: 18 mmol/L — ABNORMAL LOW (ref 22–32)
Calcium: 8.1 mg/dL — ABNORMAL LOW (ref 8.9–10.3)
Chloride: 105 mmol/L (ref 98–111)
Creatinine, Ser: 1.59 mg/dL — ABNORMAL HIGH (ref 0.44–1.00)
GFR, Estimated: 44 mL/min — ABNORMAL LOW (ref >60)
Glucose, Bld: 199 mg/dL — ABNORMAL HIGH (ref 70–99)
Potassium: 4.2 mmol/L (ref 3.5–5.1)
Sodium: 132 mmol/L — ABNORMAL LOW (ref 135–145)

## 2022-10-06 LAB — GLUCOSE, CAPILLARY
Glucose-Capillary: 178 mg/dL — ABNORMAL HIGH (ref 70–99)
Glucose-Capillary: 188 mg/dL — ABNORMAL HIGH (ref 70–99)
Glucose-Capillary: 130 mg/dL — ABNORMAL HIGH (ref 70–99)
Glucose-Capillary: 65 mg/dL — ABNORMAL LOW (ref 70–99)
Glucose-Capillary: 101 mg/dL — ABNORMAL HIGH (ref 70–99)
Glucose-Capillary: 69 mg/dL — ABNORMAL LOW (ref 70–99)

## 2022-10-06 MED ORDER — INSULIN DETEMIR 100 UNIT/ML ~~LOC~~ SOLN
6.0000 [IU] | Freq: Every day | SUBCUTANEOUS | Status: DC
  Administered 2022-10-06: 6 [IU] via SUBCUTANEOUS
  Filled 2022-10-06 (×2): qty 0.06

## 2022-10-06 MED ORDER — IOHEXOL 300 MG/ML  SOLN
80.0000 mL | Freq: Once | INTRAMUSCULAR | Status: AC | PRN
  Administered 2022-10-06: 80 mL via INTRAVENOUS

## 2022-10-06 MED ORDER — DEXTROSE-SODIUM CHLORIDE 5-0.9 % IV SOLN: INTRAVENOUS | Status: DC

## 2022-10-06 NOTE — Progress Notes (Signed)
Hypoglycemic Event  CBG: 65  Treatment: 8 oz juice/soda  Symptoms: None  Follow-up CBG: Time: 2107 CBG Result:101  Possible Reasons for Event: Unknown  Comments/MD notified:James Garner Nash, NP    Destiny Hagin, Dwana Melena

## 2022-10-06 NOTE — Progress Notes (Signed)
PROGRESS NOTE Kelli Hudson  WGN:[id number] DOB: 25-Jul-1990 DOA: 10/03/2022 PCP: Norm Salt, PA  Brief Narrative/Hospital Course: 32 y.o. female with medical history significant of DM1, diabetic gastroparesis with very frequent admission in the past, HTN presented w/ c/o B foot wounds, fever, and pain and informed that she has been dealing with lower extremity wound for past 2 weeks ,followed with her podiatrist in the outpatient setting at [facility] there which she recently finished a course of doxycycline without improving her symptoms.  Started with blisters that ruptured and subsequently redness and streaking up her legs.     In the ED Tmax 102.5,  HR upto 115,BP soft, not hypoxic.  Labs with significant leukocytosis 13.3, lactic acid normal 0.9, hyponatremia 131 creatinine 1.6 bicarb 18 normal LFTs and lipase.  On imaging with chest x-ray, x-rays of right and left foot-erosions noted no osseous changes  Patient admitted for sepsis due to cellulitis/lower extremity wound, severe anemia, AKI. Podiatry consulted Unable to complete MRI as gastric pacemaker is not MRI safe 8/18: Taken to the OR for irrigation and debridement with incision and drainage of bilateral feet   Subjective: Patient seen and examined.  Complains of foot pain.  She tells me she is all right.  Without any nausea or vomiting today.  Mother on the phone. Multiple discussions about MRI.  Since patient has symptoms for more than a month, we will do CT scan with contrast to look for any osteo.  Chronic osteomyelitis this should be seen and contrast CT scan. Very high leukocyte count today.  Probable leukemoid reaction.  Assessment and Plan: Principal Problem:   Sepsis due to cellulitis [facility]) Active Problems:   AKI (acute kidney injury) (HCC)   Cellulitis of lower extremity   Anemia   Diabetic gastroparesis associated with type 1 diabetes mellitus (HCC)   DM type 1 (diabetes mellitus, type 1)  (HCC)   Drug-seeking behavior  Bilateral feet abscess and cellulitis  Sepsis POA due to above : Very high leukocyte counts.   ABI unremarkable.   MRI not conditional.   Continue vancomycin Flagyl and cefepime.  Status post I&D.  Surgical cultures pending.     AKI on CKD 3a with metabolic acidosis Baseline creatinine seems to be around 1.19-1.3 back in March 2024. Likely in the setting of sepsis and wound infection.Holding antihypertensive avoid nephrotoxic medication, avoid hypotension.  Monitor intake output and trend labs, and gentle IV fluid hydration.  Trending down. Recent Labs    04/20/22 0857 05/05/22 1305 05/06/22 0317 05/07/22 0601 05/08/22 0532 05/09/22 0612 10/03/22 1354 10/04/22 0409 10/05/22 0835 10/06/22 0308  BUN 13 23* 21* 26* 27* 26* 31* 31* 22* 24*  CREATININE 1.52* 1.42* 1.07* 1.72* 1.30* 1.19* 1.64* 1.76* 1.61* 1.59*  CO2 31 25 20* 21* 20* 19* 18* 19* 18* 18*    Anemia No obvious stigmata of GI bleeding, Hemoccult was negative.  Likely in the setting of chronic disease, infection.  Received total 2 units of blood during this hospitalization.  Recent Labs    03/24/22 0553 03/25/22 0543 10/04/22 0409 10/04/22 1200 10/05/22 0835 10/05/22 2200 10/06/22 0308  HGB 10.1*   < > 6.8* 9.1* 7.1* 8.7* 8.9*  MCV 93.9   < > 93.9  --  90.2  --  90.4  VITAMINB12 771  --   --   --   --   --   --   FOLATE 12.0  --   --   --   --   --   --  FERRITIN 18  --   --   --   --   --   --   TIBC 417  --   --   --   --   --   --   IRON 78  --   --   --   --   --   --   RETICCTPCT 2.3  --   --   --   --   --   --    < > = values in this interval not displayed.    Thrombocytosis: likely in the setting of infection.Monitor  Type 1 diabetes mellitus w/ hypoglycemia/uncontrolled hyperglycemia: she has brittle diabetes.  Blood sugars are better now.  Eating diet with carb modified.   Will add long-acting insulin 6 units.  Continue sliding scale insulin.  Discontinue dextrose  fluids.     Diabetic gastroparesis s/p gastric pacemaker Recurrent admission for intractable nausea vomiting: Dilaudid seeking behavior. Continue scheduled Reglan 10mg  3 times daily,  prn zofraran, last QTc was normal on 8/16 admission. Continue symptomatic management.  Diet as tolerated.  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 Placed on- PRN IV dilaudid if needed this admission for severe pain  Diarrhea: No more diarrhea.  Discontinue isolation precautions.  Difficult IV accecss: Rt Femoral line palced 10/04/22 pm urgently due to no iv access while hypoglycemic. PICC line ordered and remove femoral line soon after picc.  DVT prophylaxis: SCDs Start: 10/05/22 1253 Place and maintain sequential compression device Start: 10/04/22 0825 Code Status:   Code Status: Full Code Family Communication: Mother on the phone. Patient status is: Inpatient because of sepsis/leg wound Level of care: Progressive   Dispo: The patient is from: home w/ boyfriend and is disabled.            Anticipated disposition: TBD Objective: Vitals last 24 hrs: Vitals:   10/05/22 1755 10/05/22 2130 10/06/22 0526 10/06/22 1319  BP: 125/75 121/69 129/81 138/88  Pulse: (!) 110 (!) 103  (!) 106  Resp: 16 18 18 18   Temp: 98.7 F (37.1 C) 98.9 F (37.2 C) 97.8 F (36.6 C) 98.3 F (36.8 C)  TempSrc: Oral Oral Oral Oral  SpO2: 100% 100% 100% 95%  Weight:      Height:       Weight change:   Physical Examination: General: Patient looks fairly comfortable and talking on the phone.  Alert awake and oriented. Cardiovascular: S1-S2 normal.  Regular rate rhythm. Respiratory: Bilateral clear.  No added sounds. Gastrointestinal: Soft and nontender.  Gastric stimulator present. Ext: Both feet wrapped, immediate postop dressing.  Not removed by me. Neuro: Intact.   Medications reviewed:  Scheduled  Meds:  sodium chloride   Intravenous Once   Chlorhexidine Gluconate Cloth  6 each Topical Daily   DULoxetine  30 mg Oral Daily   feeding supplement (GLUCERNA SHAKE)  237 mL Oral BID BM   ferrous sulfate  325 mg Oral Daily   gabapentin  300 mg Oral TID   insulin aspart  0-9 Units Subcutaneous TID WC   insulin detemir  6 Units Subcutaneous Daily   metoCLOPramide (REGLAN) injection  10 mg Intravenous Q8H   nutrition supplement (JUVEN)  1 packet Oral BID BM   silver sulfADIAZINE   Topical Daily   sodium chloride flush  10-40 mL Intracatheter Q12H   Continuous Infusions:  ceFEPime (MAXIPIME) IV 2 g (10/06/22 1151)   metronidazole  500 mg (10/06/22 0527)   vancomycin 1,250 mg (10/06/22 1514)     Diet Order             Diet Carb Modified Fluid consistency: Thin; Room service appropriate? Yes  Diet effective now                  Intake/Output Summary (Last 24 hours) at 10/06/2022 1549 Last data filed at 10/06/2022 1029 Gross per 24 hour  Intake 2766.77 ml  Output --  Net 2766.77 ml   Net IO Since Admission: 7,120.01 mL [10/06/22 1549]  Wt Readings from Last 3 Encounters:  10/03/22 84.8 kg  06/18/22 83.5 kg  06/16/22 83.9 kg     Unresulted Labs (From admission, onward)     Start     Ordered   10/07/22 0500  Comprehensive metabolic panel  Tomorrow morning,   R       Question:  Specimen collection method  Answer:  IV Team=IV Team collect   10/06/22 1532   10/07/22 0500  Magnesium  Tomorrow morning,   R       Question:  Specimen collection method  Answer:  IV Team=IV Team collect   10/06/22 1532   10/05/22 1235  Gastrointestinal Panel by PCR , Stool  (Gastrointestinal Panel by PCR, Stool                                                                                                                                                     **Does Not include CLOSTRIDIUM DIFFICILE testing. **If CDIFF testing is needed, place order from the "C Difficile Testing" order set.**)  Once,   R         10/05/22 1235   10/05/22 0715  C Difficile Quick Screen w PCR reflex  (C Difficile quick screen w PCR reflex panel )  Once, for 24 hours,   TIMED       References:    CDiff Information Tool   10/05/22 0714   10/05/22 0500  CBC  Daily,   R      10/04/22 0818          Data Reviewed: I have personally reviewed following labs and imaging studies CBC: Recent Labs  Lab 10/03/22 1354 10/03/22 2029 10/04/22 0409 10/04/22 1200 10/05/22 0835 10/05/22 2200 10/06/22 0308  WBC 31.3*  --  26.9*  --  42.2*  --  55.0*  NEUTROABS 27.8*  --   --   --   --   --   --   HGB 6.4*   < > 6.8* 9.1* 7.1* 8.7* 8.9*  HCT 20.2*   < > 21.6* 30.5* 22.2* 27.2* 28.2*  MCV 89.0  --  93.9  --  90.2  --  90.4  PLT 566*  --  724*  --  558*  --  630*   < > = values in this interval not displayed.   Basic Metabolic Panel: Recent Labs  Lab 10/03/22 1354 10/04/22 0409 10/05/22 0835 10/06/22 0308  NA 130* 133* 133* 132*  K 4.2 3.8 3.7 4.2  CL 103 106 107 105  CO2 18* 19* 18* 18*  GLUCOSE 246* 160* 126* 199*  BUN 31* 31* 22* 24*  CREATININE 1.64* 1.76* 1.61* 1.59*  CALCIUM 8.2* 8.3* 7.7* 8.1*   GFR: Estimated Creatinine Clearance: 57.4 mL/min (A) (by C-G formula based on SCr of 1.59 mg/dL (H)). Liver Function Tests: Recent Labs  Lab 10/03/22 1354  AST 11*  ALT 8  ALKPHOS 108  BILITOT 0.3  PROT 6.2*  ALBUMIN 2.1*   Recent Labs  Lab 10/03/22 1354  LIPASE 11   Recent Results (from the past 240 hour(s))  Culture, blood (Routine x 2)     Status: None (Preliminary result)   Collection Time: 10/03/22  1:52 PM   Specimen: BLOOD RIGHT ARM  Result Value Ref Range Status   Specimen Description   Final    BLOOD RIGHT ARM Performed at [facility], [address], [place], [place] [address]    Special Requests   Final    BOTTLES DRAWN AEROBIC AND ANAEROBIC Blood Culture adequate volume Performed at [facility], [address] [place], [place] [address]     Culture   Final    NO GROWTH 3 DAYS Performed at [facility], [address] [place], [place] [address]    Report Status PENDING  Incomplete  SARS Coronavirus 2 by RT PCR (hospital order, performed in [facility] hospital lab) *cepheid single result test* Anterior Nasal Swab     Status: None   Collection Time: 10/03/22  1:54 PM   Specimen: Anterior Nasal Swab  Result Value Ref Range Status   SARS Coronavirus 2 by RT PCR NEGATIVE NEGATIVE Final    Comment: (NOTE) SARS-CoV-2 target nucleic acids are NOT DETECTED.  The SARS-CoV-2 RNA is generally detectable in upper and lower respiratory specimens during the acute phase of infection. The lowest concentration of SARS-CoV-2 viral copies this assay can detect is 250 copies / mL. A negative result does not preclude SARS-CoV-2 infection and should not be used as the sole basis for treatment or other patient management decisions.  A negative result may occur with improper specimen collection / handling, submission of specimen other than nasopharyngeal swab, presence of viral mutation(s) within the areas targeted by this assay, and inadequate number of viral copies (<250 copies / mL). A negative result must be combined with clinical observations, patient history, and epidemiological information.  Fact Sheet for Patients:   RoadLapTop.co.za  Fact Sheet for Healthcare Providers: http://kim-miller.com/  This test is not yet approved or  cleared by the [place] FDA and has been authorized for detection and/or diagnosis of SARS-CoV-2 by FDA under an Emergency Use Authorization (EUA).  This EUA will remain in effect (meaning this test can be used) for the duration of the COVID-19 declaration under Section 564(b)(1) of the Act, 21 U.S.C. section 360bbb-3(b)(1), unless the authorization is terminated or revoked sooner.  Performed at [facility], [address], [place], [place] [address]   Aerobic/Anaerobic Culture w Gram Stain (surgical/deep wound)     Status: None (Preliminary result)   Collection Time: 10/05/22 10:48 AM   Specimen: Wound  Result Value Ref Range Status   Specimen Description   Final    WOUND  WOUND, RIGHT FOOT Performed at [facility], [address], [place], [place] [address]    Special Requests   Final    NONE Performed at [facility], [address] [place], [place] [address]    Gram Stain   Final    MODERATE WBC PRESENT,BOTH PMN AND MONONUCLEAR NO ORGANISMS SEEN    Culture   Final    NO GROWTH < 24 HOURS Performed at [facility], [address], [place], [place] [address]    Report Status PENDING  Incomplete    Antimicrobials: Anti-infectives (From admission, onward)    Start     Dose/Rate Route Frequency Ordered Stop   10/04/22 1600  vancomycin (VANCOREADY) IVPB 1250 mg/250 mL        1,250 mg 166.7 mL/hr over 90 Minutes Intravenous Every 24 hours 10/03/22 2312     10/04/22 0000  ceFEPIme (MAXIPIME) 2 g in sodium chloride 0.9 % 100 mL IVPB        2 g 200 mL/hr over 30 Minutes Intravenous Every 12 hours 10/03/22 2312     10/03/22 1530  vancomycin (VANCOCIN) 500 mg in sodium chloride 0.9 % 100 mL IVPB        500 mg 100 mL/hr over 60 Minutes Intravenous  Once 10/03/22 1418 10/03/22 2020   10/03/22 1430  vancomycin (VANCOCIN) IVPB 1000 mg/200 mL premix        1,000 mg 200 mL/hr over 60 Minutes Intravenous  Once 10/03/22 1417 10/03/22 1830   10/03/22 1415  metroNIDAZOLE (FLAGYL) IVPB 500 mg        500 mg 100 mL/hr over 60 Minutes Intravenous Every 12 hours 10/03/22 1413 10/10/22 1659   10/03/22 1415  ceFEPIme (MAXIPIME) 2 g in sodium chloride 0.9 % 100 mL IVPB        2 g 200 mL/hr over 30 Minutes Intravenous  Once 10/03/22 1417 10/03/22 1530      Culture/Microbiology    Component Value Date/Time   SDES  10/05/2022 1048    WOUND WOUND, RIGHT  FOOT Performed at [facility], [address], [place], [place] [address]    SPECREQUEST  10/05/2022 1048    NONE Performed at [facility], [address] [place], [place] [address]    CULT  10/05/2022 1048    NO GROWTH < 24 HOURS Performed at [facility], [address], [place], [place] [address]    REPTSTATUS PENDING 10/05/2022 1048    Other culture-see note  Radiology Studies: [place] EKG SITE RITE  Result Date: 10/05/2022 If Site Rite image not attached, placement could not be confirmed due to current cardiac rhythm.  DG CHEST PORT 1 VIEW  Result Date: 10/04/2022 CLINICAL DATA:  Dyspnea EXAM: PORTABLE CHEST 1 VIEW COMPARISON:  Chest x-ray 10/03/2022 FINDINGS: The heart size and mediastinal contours are within normal limits. Both lungs are clear. The visualized skeletal structures are unremarkable. IMPRESSION: No active disease. Electronically Signed   By: Darliss Cheney M.D.   On: 10/04/2022 22:53   DG Abd 1 View  Result Date: 10/04/2022 CLINICAL DATA:  Central line EXAM: ABDOMEN - 1 VIEW COMPARISON:  None Available. FINDINGS: Right femoral approach central venous catheter tip projects over the expected region of the right common iliac vein/IVC junction. Bowel-gas pattern is nonobstructive. Generator overlies the left abdomen. No suspicious calcifications are seen. Lung bases are clear. Osseous structures are within normal limits. IMPRESSION: Right femoral approach central venous catheter  tip projects over the expected region of the right common iliac vein/IVC junction. Electronically Signed   By: Darliss Cheney M.D.   On: 10/04/2022 19:47     LOS: 3 days   Total time spent: 35 minutes  Dorcas Carrow, MD [facility]  10/06/2022, 3:49 PM

## 2022-10-06 NOTE — TOC Initial Note (Addendum)
Transition of Care [facility]) - Initial/Assessment Note    Patient Details  Name: Kelli Hudson MRN: [id number] Date of Birth: October 31, 1990  Transition of Care [facility]) CM/SW Contact:    Larrie Kass, LCSW Phone Number: 10/06/2022, 8:57 AM  Clinical Narrative:                 Received consult for medications assistance , pt has active Medicaid and will not qualify for [facility] program. TOC to follow.   [place]  1:00pm CSW spoke with pt and pt's mother Karen Kitchens, pt's mother is requesting home health services for medication management and wound care. CSW explained the barriers to arranging home health services with pt's Medicaid. [name] showed CSW a new Medicare card, however, according to the card pt's Medicare coverage does not start until 01/18/23. CSW will attempt to arrange [facility].  Pt reports being independent at home with no DME needs at this time. [name] will provide transport at the time of d/c. TOC to follow    Expected Discharge Plan:  (TBD) Barriers to Discharge: Continued Medical Work up   Patient Goals and CMS Choice            Expected Discharge Plan and Services       Living arrangements for the past 2 months: Apartment                                      Prior Living Arrangements/Services Living arrangements for the past 2 months: Apartment   Patient language and need for interpreter reviewed:: No Do you feel safe going back to the place where you live?: Yes            Criminal Activity/Legal Involvement Pertinent to Current Situation/Hospitalization: No - Comment as needed  Activities of Daily Living Home Assistive Devices/Equipment: None ADL Screening (condition at time of admission) Patient's cognitive ability adequate to safely complete daily activities?: Yes Is the patient deaf or have difficulty hearing?: No Does the patient have difficulty seeing, even when wearing glasses/contacts?: No Does the patient have difficulty  concentrating, remembering, or making decisions?: No Patient able to express need for assistance with ADLs?: Yes Does the patient have difficulty dressing or bathing?: No Independently performs ADLs?: Yes (appropriate for developmental age) Does the patient have difficulty walking or climbing stairs?: No Weakness of Legs: Both Weakness of Arms/Hands: None  Permission Sought/Granted                  Emotional Assessment              Admission diagnosis:  Sepsis (HCC) [A41.9] Cellulitis of lower extremity, unspecified laterality [L03.119] Anemia, unspecified type [D64.9] Sepsis, due to unspecified organism, unspecified whether acute organ dysfunction present [facility]) [A41.9] Patient Active Problem List   Diagnosis Date Noted   Sepsis due to cellulitis (HCC) 10/03/2022   Cellulitis of lower extremity 10/03/2022   Multinodular goiter 06/18/2022   Malnutrition of moderate degree 04/18/2022   Intractable vomiting with nausea 04/17/2022   DKA (diabetic ketoacidosis) (HCC) 03/04/2022   Refractory nausea and vomiting 12/25/2021   Diabetic gastroparesis (HCC) 12/07/2021   Intractable nausea and vomiting 10/09/2021   Drug-seeking behavior 09/21/2021   Essential hypertension 08/25/2021   GERD without esophagitis 08/25/2021   Hematemesis    Prolonged Q-T interval on ECG    Nausea and vomiting 07/20/2021   Hypomagnesemia 04/21/2021   Ulcerated, foot, left, with  fat layer exposed (HCC) 04/20/2021   Uncontrolled type 1 diabetes mellitus with hyperglycemia, with long-term current use of insulin (HCC) 12/18/2020   DM type 1 (diabetes mellitus, type 1) (HCC) 12/18/2020   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) 11/09/2020   Diabetic retinopathy of both eyes associated with type 1 diabetes mellitus (HCC) 09/24/2020   Diabetic gastroparesis associated with type 1 diabetes mellitus (HCC) 08/12/2020   AKI (acute kidney injury) (HCC) 07/25/2020   Generalized  abdominal pain 07/23/2020   Diabetic nephropathy associated with type 1 diabetes mellitus (HCC) 06/27/2020   HLD (hyperlipidemia) 05/23/2020   Sinus tachycardia 05/09/2020   Anemia 04/20/2020   Depression with anxiety 07/05/2019   Diabetic polyneuropathy associated with type 1 diabetes mellitus (HCC) 09/24/2017   Gastroparesis    UTI (urinary tract infection) 06/14/2011   Goiter 10/05/2009   Hypertension associated with diabetes (HCC) 10/05/2009   PCP:  Norm Salt, PA Pharmacy:   [facility] [phone] [facility] [place], [place] - [address] [place] [address] Phone: [phone] Fax: [phone]  [place] [facility] [address] [place] [address] Phone: [phone] Fax: [phone]  [facility] [address] [place] [address] Phone: [phone] Fax: [phone]     Social Determinants of Health (SDOH) Social History: SDOH Screenings   Food Insecurity: No Food Insecurity (10/03/2022)  Housing: Low Risk  (10/03/2022)  Transportation Needs: No Transportation Needs (10/03/2022)  Utilities: Not At Risk (10/03/2022)  Alcohol Screen: Low Risk  (05/29/2021)  Depression (PHQ2-9): Low Risk  (05/29/2021)  Recent Concern: Depression (PHQ2-9) - Medium Risk (04/10/2021)  Financial Resource Strain: Low Risk  (02/13/2022)   Received from [facility]  Social Connections: Unknown (06/17/2021)   Received from [facility]  Stress: No Stress Concern Present (09/16/2022)   Received from [facility]  Tobacco Use: Low Risk  (10/03/2022)   SDOH Interventions:     Readmission Risk Interventions    05/08/2022    9:45 AM 03/24/2022    3:33 PM 12/12/2021    8:59 AM  Readmission Risk Prevention Plan  Transportation Screening Complete Complete Complete  Medication Review (RN Care Manager) Complete Complete Complete  PCP or Specialist  appointment within 3-5 days of discharge Complete Complete Complete  HRI or Home Care Consult Complete Complete Complete  SW Recovery Care/Counseling Consult Complete Complete Complete  Palliative Care Screening Not Applicable Not Applicable Not Applicable  Skilled Nursing Facility Not Applicable Not Applicable Not Applicable

## 2022-10-06 NOTE — Progress Notes (Signed)
Peripherally Inserted Central Catheter Placement  The IV Nurse has discussed with the patient and/or persons authorized to consent for the patient, the purpose of this procedure and the potential benefits and risks involved with this procedure.  The benefits include less needle sticks, lab draws from the catheter, and the patient may be discharged home with the catheter. Risks include, but not limited to, infection, bleeding, blood clot (thrombus formation), and puncture of an artery; nerve damage and irregular heartbeat and possibility to perform a PICC exchange if needed/ordered by physician.  Alternatives to this procedure were also discussed.  Bard Power PICC patient education guide, fact sheet on infection prevention and patient information card has been provided to patient /or left at bedside.    PICC Placement Documentation  PICC Double Lumen 10/06/22 Right Brachial 33 cm 0 cm (Active)  Indication for Insertion or Continuance of Line Poor Vasculature-patient has had multiple peripheral attempts or PIVs lasting less than 24 hours 10/06/22 1600  Exposed Catheter (cm) 0 cm 10/06/22 1600  Site Assessment Clean, Dry, Intact 10/06/22 1600  Lumen #1 Status Flushed;Saline locked;Blood return noted 10/06/22 1600  Lumen #2 Status Flushed;Saline locked;No blood return 10/06/22 1600  Dressing Type Transparent;Securing device 10/06/22 1600  Dressing Status Antimicrobial disc in place;Clean, Dry, Intact 10/06/22 1600  Line Care Connections checked and tightened 10/06/22 1600  Line Adjustment (NICU/IV Team Only) No 10/06/22 1600  Dressing Intervention New dressing 10/06/22 1600  Dressing Change Due 10/13/22 10/06/22 1600       Franne Grip Renee 10/06/2022, 5:53 PM

## 2022-10-06 NOTE — TOC Progression Note (Signed)
Transition of Care [facility]) - Progression Note    Patient Details  Name: Kelli Hudson MRN: [id number] Date of Birth: 22-Jun-1990  Transition of Care [facility] CM/SW Contact  Larrie Kass, LCSW Phone Number: 10/06/2022, 2:32 PM  Clinical Narrative:    Referrals sent out to:  [facility]-declined  [facility]-declined  [facility]-declined  [facility]-declined  [facility]- declined [facility]- Pending  [facility]- Pending.  TOC to follow.   Expected Discharge Plan: Home w Home Health Services (TBD) Barriers to Discharge: Continued Medical Work up  Expected Discharge Plan and Services       Living arrangements for the past 2 months: Apartment                                       Social Determinants of Health (SDOH) Interventions SDOH Screenings   Food Insecurity: No Food Insecurity (10/03/2022)  Housing: Low Risk  (10/03/2022)  Transportation Needs: No Transportation Needs (10/03/2022)  Utilities: Not At Risk (10/03/2022)  Alcohol Screen: Low Risk  (05/29/2021)  Depression (PHQ2-9): Low Risk  (05/29/2021)  Recent Concern: Depression (PHQ2-9) - Medium Risk (04/10/2021)  Financial Resource Strain: Low Risk  (02/13/2022)   Received from [facility], [facility]  Social Connections: Unknown (06/17/2021)   Received from [facility], [facility]  Stress: No Stress Concern Present (09/16/2022)   Received from [facility]  Tobacco Use: Low Risk  (10/03/2022)    Readmission Risk Interventions    05/08/2022    9:45 AM 03/24/2022    3:33 PM 12/12/2021    8:59 AM  Readmission Risk Prevention Plan  Transportation Screening Complete Complete Complete  Medication Review (RN Care Manager) Complete Complete Complete  PCP or Specialist appointment within 3-5 days of discharge Complete Complete Complete  HRI or Home Care Consult Complete Complete Complete  SW Recovery Care/Counseling Consult Complete Complete Complete  Palliative Care Screening Not Applicable Not Applicable  Not Applicable  Skilled Nursing Facility Not Applicable Not Applicable Not Applicable

## 2022-10-06 NOTE — Progress Notes (Signed)
   Subjective: 1 Day Post-Op Procedure(s) (LRB): IRRIGATION AND DEBRIDEMENT FOOT (Bilateral) DOS: 10/05/2022  Patient resting in bed.  Mother is present.  Patient states that she is feeling somewhat better.  According to the mother the MRI to the bilateral feet has been rescheduled.  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  CT with contrast not advisable due to impaired renal function.  Objective: Vital signs in last 24 hours: Temp:  [97.8 F (36.6 C)-99.7 F (37.6 C)] 99.7 F (37.6 C) (08/19 1731) Pulse Rate:  [103-110] 110 (08/19 1731) Resp:  [18] 18 (08/19 1319) BP: (121-138)/(69-88) 134/70 (08/19 1731) SpO2:  [95 %-100 %] 96 % (08/19 1731)  Recent Labs    10/04/22 0409 10/04/22 1200 10/05/22 0835 10/05/22 2200 10/06/22 0308  HGB 6.8* 9.1* 7.1* 8.7* 8.9*   Recent Labs    10/05/22 0835 10/05/22 2200 10/06/22 0308  WBC 42.2*  --  55.0*  RBC 2.46*  --  3.12*  HCT 22.2* 27.2* 28.2*  PLT 558*  --  630*   Recent Labs    10/05/22 0835 10/06/22 0308  NA 133* 132*  K 3.7 4.2  CL 107 105  CO2 18* 18*  BUN 22* 24*  CREATININE 1.61* 1.59*  GLUCOSE 126* 199*  CALCIUM 7.7* 8.1*   No results for input(s): "LABPT", "INR" in the last 72 hours.  Specimen Description WOUND WOUND, RIGHT FOOT Performed at [facility], [address] [place], [place] [address]  Special Requests NONE Performed at [facility], [address] [place], [place] [address]  Gram Stain MODERATE WBC PRESENT,BOTH PMN AND MONONUCLEAR NO ORGANISMS SEEN  Culture NO GROWTH < 24 HOURS Performed at [facility], [address], [place], [place] [address]   Assessment/Plan: 1 Day Post-Op Procedure(s) (LRB): IRRIGATION AND DEBRIDEMENT FOOT (Bilateral) DOS: 10/05/2022  -Dressings were changed.  Packing from the right foot was removed. -Continue IV  broad-spectrum cefepime, Flagyl, Vanc -Cultures pending.  No growth < 24hrs -Reached out to Dr. Jerral Ralph, attending hospitalist to inquire about MRI versus CT with contrast to determine any osseous involvement, most concerning for the right foot.  At this time the determination was made to proceed with CT with contrast bilateral feet.  This has been ordered.  Pending -Will follow   [name] 10/06/2022, 7:17 PM  [name], DPM [facility]  Dr. [name], DPM    [address] [place] [address] [place], [place] [address]                Office [phone]  Fax [phone]

## 2022-10-06 NOTE — Plan of Care (Signed)
  Problem: Education: Goal: Knowledge of General Education information will improve Description: Including pain rating scale, medication(s)/side effects and non-pharmacologic comfort measures Outcome: Progressing   Problem: Health Behavior/Discharge Planning: Goal: Ability to manage health-related needs will improve Outcome: Progressing   Problem: Clinical Measurements: Goal: Ability to maintain clinical measurements within normal limits will improve Outcome: Progressing Goal: Will remain free from infection Outcome: Progressing   Problem: Nutrition: Goal: Adequate nutrition will be maintained Outcome: Progressing   Problem: Pain Managment: Goal: General experience of comfort will improve Outcome: Progressing   Problem: Clinical Measurements: Goal: Diagnostic test results will improve Outcome: Adequate for Discharge Goal: Respiratory complications will improve Outcome: Adequate for Discharge Goal: Cardiovascular complication will be avoided Outcome: Adequate for Discharge   Problem: Coping: Goal: Level of anxiety will decrease Outcome: Adequate for Discharge   Problem: Elimination: Goal: Will not experience complications related to urinary retention Outcome: Adequate for Discharge   Problem: Safety: Goal: Ability to remain free from injury will improve Outcome: Adequate for Discharge

## 2022-10-06 NOTE — Progress Notes (Signed)
Pharmacy Antibiotic Note  Kelli Hudson is a 32 y.o. female admitted on 10/03/2022 with sepsis due to bilateral foot cellulitis/wounds.  Pharmacy has been consulted for Cefepime and Vancomycin dosing for diabetic foot infection.  8/18: Taken to the OR for irrigation and debridement with incision and drainage of bilateral feet   Plan: -  Continue Cefepime 2gm IV q12h -  Continue Vancomycin 1250mg  IV every 24 hours.  (Estimated AUC = 447). -  Measure Vancomycin levels as needed. Goal AUC: 400-550 -  Follow up renal function, culture results, and clinical course.enal function and cx data   Height: 5\' 7"  (170.2 cm) Weight: 84.8 kg (186 lb 15.2 oz) IBW/kg (Calculated) : 61.6  Temp (24hrs), Avg:98.6 F (37 C), Min:97.7 F (36.5 C), Max:99.5 F (37.5 C)  Recent Labs  Lab 10/03/22 1354 10/04/22 0409 10/05/22 0835 10/06/22 0308  WBC 31.3* 26.9* 42.2* 55.0*  CREATININE 1.64* 1.76* 1.61* 1.59*  LATICACIDVEN 0.9  --   --   --     Estimated Creatinine Clearance: 57.4 mL/min (A) (by C-G formula based on SCr of 1.59 mg/dL (H)).    Allergies  Allergen Reactions   Ibuprofen Other (See Comments)    07/23/22 - Worsening kidney function after 3 doses of ibuprofen 400mg .  Creatinine increased from 1.3 to 2.  Creatinine returned to normal after stopping.   Lisinopril Cough, Other (See Comments), Itching and Rash    Breakout in rash    Antimicrobials this admission: 8/16 Cefepime >>  8/16 Vancomycin >>  8/16 Metronidazole >>  Dose adjustments this admission:  Microbiology results: 8/16 BCx: ngtd 8/18 R Foot Wound: pending  Thank you for allowing pharmacy to be a part of this patient's care.  Tacy Learn, PharmD Clinical Pharmacist 10/06/2022 8:10 AM

## 2022-10-06 NOTE — Inpatient Diabetes Management (Addendum)
Inpatient Diabetes Program Recommendations  AACE/ADA: New Consensus Statement on Inpatient Glycemic Control (2015)  Target Ranges:  Prepandial:   less than 140 mg/dL      Peak postprandial:   less than 180 mg/dL (1-2 hours)      Critically ill patients:  140 - 180 mg/dL   Lab Results  Component Value Date   GLUCAP 188 (H) 10/06/2022   HGBA1C 8.8 (A) 06/18/2022    Review of Glycemic Control  Latest Reference Range & Units 10/05/22 11:32 10/05/22 17:20 10/05/22 21:31 10/06/22 07:29 10/06/22 11:17  Glucose-Capillary 70 - 99 mg/dL 96 161 (H) 096 (H) 045 (H) 188 (H)   Diabetes history: DM Outpatient Diabetes medications:  Levemir 8 units bid Novolin R 1-10 units 4 times daily Current orders for Inpatient glycemic control:  Novolog 0-9 units tid with meals  Inpatient Diabetes Program Recommendations:   Note low blood sugars with basal insulin bid.  Consider adding Levemir 6 units once daily?   Thanks,  Beryl Meager, RN, BC-ADM Inpatient Diabetes Coordinator Pager [phone]  (8a-5p)

## 2022-10-06 NOTE — Progress Notes (Addendum)
       Overnight   NAME: Kelli Hudson MRN: [id number] DOB : 07/07/1990    Date of Service   10/06/2022   HPI/Events of Note    Notified by RN for inquiry of Precautions  Will discontinue precautions based on Attending notation:  "No more diarrhea. Discontinue isolation precautions. "    Interventions/ Plan   Precautions discontinued  Continue others as previously ordered.      Update Second notification 2112 hrs CBG 65 in spite of patient reporting eating food. Restarted D5 IVF at low rate     Chinita Greenland BSN MSNA MSN ACNPC-AG Acute Care Nurse Practitioner [facility]

## 2022-10-07 DIAGNOSIS — E1169 Type 2 diabetes mellitus with other specified complication: Secondary | ICD-10-CM | POA: Diagnosis not present

## 2022-10-07 DIAGNOSIS — Z794 Long term (current) use of insulin: Secondary | ICD-10-CM

## 2022-10-07 DIAGNOSIS — R6520 Severe sepsis without septic shock: Secondary | ICD-10-CM

## 2022-10-07 DIAGNOSIS — L0390 Cellulitis, unspecified: Secondary | ICD-10-CM | POA: Diagnosis not present

## 2022-10-07 DIAGNOSIS — L97509 Non-pressure chronic ulcer of other part of unspecified foot with unspecified severity: Secondary | ICD-10-CM

## 2022-10-07 DIAGNOSIS — M8618 Other acute osteomyelitis, other site: Secondary | ICD-10-CM

## 2022-10-07 DIAGNOSIS — E11621 Type 2 diabetes mellitus with foot ulcer: Secondary | ICD-10-CM

## 2022-10-07 DIAGNOSIS — A419 Sepsis, unspecified organism: Secondary | ICD-10-CM | POA: Diagnosis not present

## 2022-10-07 LAB — COMPREHENSIVE METABOLIC PANEL
ALT: 12 U/L (ref 0–44)
AST: 14 U/L — ABNORMAL LOW (ref 15–41)
Albumin: <1.5 g/dL — ABNORMAL LOW (ref 3.5–5.0)
Alkaline Phosphatase: 110 U/L (ref 38–126)
Anion gap: 7 (ref 5–15)
BUN: 20 mg/dL (ref 6–20)
CO2: 20 mmol/L — ABNORMAL LOW (ref 22–32)
Calcium: 7.9 mg/dL — ABNORMAL LOW (ref 8.9–10.3)
Chloride: 103 mmol/L (ref 98–111)
Creatinine, Ser: 1.44 mg/dL — ABNORMAL HIGH (ref 0.44–1.00)
GFR, Estimated: 50 mL/min — ABNORMAL LOW (ref >60)
Glucose, Bld: 99 mg/dL (ref 70–99)
Potassium: 4.1 mmol/L (ref 3.5–5.1)
Sodium: 130 mmol/L — ABNORMAL LOW (ref 135–145)
Total Bilirubin: 0.6 mg/dL (ref 0.3–1.2)
Total Protein: 5.5 g/dL — ABNORMAL LOW (ref 6.5–8.1)

## 2022-10-07 LAB — GLUCOSE, CAPILLARY
Glucose-Capillary: 94 mg/dL (ref 70–99)
Glucose-Capillary: 75 mg/dL (ref 70–99)
Glucose-Capillary: 120 mg/dL — ABNORMAL HIGH (ref 70–99)
Glucose-Capillary: 130 mg/dL — ABNORMAL HIGH (ref 70–99)
Glucose-Capillary: 111 mg/dL — ABNORMAL HIGH (ref 70–99)
Glucose-Capillary: 104 mg/dL — ABNORMAL HIGH (ref 70–99)

## 2022-10-07 LAB — CBC
HCT: 25.8 % — ABNORMAL LOW (ref 36.0–46.0)
Hemoglobin: 8.2 g/dL — ABNORMAL LOW (ref 12.0–15.0)
MCH: 28.8 pg (ref 26.0–34.0)
MCHC: 31.8 g/dL (ref 30.0–36.0)
MCV: 90.5 fL (ref 80.0–100.0)
Platelets: 552 10*3/uL — ABNORMAL HIGH (ref 150–400)
RBC: 2.85 MIL/uL — ABNORMAL LOW (ref 3.87–5.11)
RDW: 15.9 % — ABNORMAL HIGH (ref 11.5–15.5)
WBC: 45.3 10*3/uL — ABNORMAL HIGH (ref 4.0–10.5)
nRBC: 0 % (ref 0.0–0.2)

## 2022-10-07 LAB — LACTIC ACID, PLASMA: Lactic Acid, Venous: 0.8 mmol/L (ref 0.5–1.9)

## 2022-10-07 LAB — MAGNESIUM: Magnesium: 1.6 mg/dL — ABNORMAL LOW (ref 1.7–2.4)

## 2022-10-07 LAB — MRSA NEXT GEN BY PCR, NASAL: MRSA by PCR Next Gen: NOT DETECTED

## 2022-10-07 MED ORDER — DEXTROSE-SODIUM CHLORIDE 5-0.9 % IV SOLN: INTRAVENOUS | Status: AC

## 2022-10-07 MED ORDER — SODIUM CHLORIDE 0.9 % IV BOLUS
1000.0000 mL | Freq: Once | INTRAVENOUS | Status: AC
  Administered 2022-10-07: 1000 mL via INTRAVENOUS

## 2022-10-07 MED ORDER — SODIUM CHLORIDE 0.9 % IV SOLN
2.0000 g | Freq: Three times a day (TID) | INTRAVENOUS | Status: DC
  Administered 2022-10-07 – 2022-10-08 (×2): 2 g via INTRAVENOUS
  Filled 2022-10-07 (×2): qty 12.5

## 2022-10-07 MED ORDER — LINEZOLID 600 MG/300ML IV SOLN
600.0000 mg | Freq: Two times a day (BID) | INTRAVENOUS | Status: DC
  Administered 2022-10-07 – 2022-10-10 (×7): 600 mg via INTRAVENOUS
  Filled 2022-10-07 (×9): qty 300

## 2022-10-07 MED ORDER — LACTATED RINGERS IV BOLUS
1000.0000 mL | Freq: Once | INTRAVENOUS | Status: AC
  Administered 2022-10-07: 1000 mL via INTRAVENOUS

## 2022-10-07 MED ORDER — ORAL CARE MOUTH RINSE: 15.0000 mL | OROMUCOSAL | Status: DC | PRN

## 2022-10-07 MED ORDER — HYDROMORPHONE HCL 1 MG/ML IJ SOLN
0.5000 mg | INTRAMUSCULAR | Status: DC | PRN
  Administered 2022-10-07 – 2022-10-08 (×5): 0.5 mg via INTRAVENOUS
  Filled 2022-10-07 (×5): qty 1

## 2022-10-07 MED ORDER — LACTATED RINGERS IV SOLN: INTRAVENOUS | Status: DC

## 2022-10-07 MED ORDER — DEXTROSE-SODIUM CHLORIDE 5-0.9 % IV SOLN: INTRAVENOUS | Status: DC

## 2022-10-07 MED ORDER — MAGNESIUM SULFATE 2 GM/50ML IV SOLN
2.0000 g | Freq: Once | INTRAVENOUS | Status: AC
  Administered 2022-10-07: 2 g via INTRAVENOUS
  Filled 2022-10-07: qty 50

## 2022-10-07 NOTE — TOC Progression Note (Addendum)
Transition of Care [facility]) - Progression Note    Patient Details  Name: Kelli Hudson MRN: [id number] Date of Birth: 1990/08/16  Transition [facility]) CM/SW Contact  Larrie Kass, LCSW Phone Number: 10/07/2022, 10:37 AM  Clinical Narrative:    CSW spoke with pt's mother to provide an update on [facility] agencies. She inquired about becoming pt's Legal Guardian, as she believes her daughter is unable to express her concerns about her medical care. [name] reported when her daughter is in pain she is not able to understand what anyone is telling her. CSW explained Legal guardianship takes place through the court system. CSW explained she will have to prove the pt does not have the capacity to make decisions.  At this time [facility] and [facility] is still reviewing pt for home health services. TOC to follow.    Adden  12:40pm Received a call from Kasie with [facility], she reports that the pt does not have Home Health benefits under her Medicaid coverage. TOC to follow.   Expected Discharge Plan: Home w Home Health Services (TBD) Barriers to Discharge: Continued Medical Work up  Expected Discharge Plan and Services       Living arrangements for the past 2 months: Apartment                                       Social Determinants of Health (SDOH) Interventions SDOH Screenings   Food Insecurity: No Food Insecurity (10/03/2022)  Housing: Low Risk  (10/03/2022)  Transportation Needs: No Transportation Needs (10/03/2022)  Utilities: Not At Risk (10/03/2022)  Alcohol Screen: Low Risk  (05/29/2021)  Depression (PHQ2-9): Low Risk  (05/29/2021)  Recent Concern: Depression (PHQ2-9) - Medium Risk (04/10/2021)  Financial Resource Strain: Low Risk  (02/13/2022)   Received from [facility], [facility]  Social Connections: Unknown (06/17/2021)   Received from [facility], [facility]  Stress: No Stress Concern Present (09/16/2022)   Received from [facility]  Tobacco  Use: Low Risk  (10/03/2022)    Readmission Risk Interventions    05/08/2022    9:45 AM 03/24/2022    3:33 PM 12/12/2021    8:59 AM  Readmission Risk Prevention Plan  Transportation Screening Complete Complete Complete  Medication Review (RN Care Manager) Complete Complete Complete  PCP or Specialist appointment within 3-5 days of discharge Complete Complete Complete  HRI or Home Care Consult Complete Complete Complete  SW Recovery Care/Counseling Consult Complete Complete Complete  Palliative Care Screening Not Applicable Not Applicable Not Applicable  Skilled Nursing Facility Not Applicable Not Applicable Not Applicable

## 2022-10-07 NOTE — Plan of Care (Signed)
  Problem: Education: Goal: Knowledge of General Education information will improve Description Including pain rating scale, medication(s)/side effects and non-pharmacologic comfort measures Outcome: Progressing   

## 2022-10-07 NOTE — Significant Event (Addendum)
Rapid Response Event Note   0118: call received Notified by bedside nurse, Chole Addison, RN that patient's sepsis alert (MEWS) turned to red due to fever and related tachycardia. Chole administered Tylenol and Ice packs while turning the room temperature down. I met patient and explained need to cool her and keep light covering on rather than heavy blankets. Patient is alert and oriented, following all commands and verbalized understanding of education. She has a dry cough and is being treated for infection in her feet. Advised use of incentive spirometry as well. Chole will see to pain management needs as patient has requested.  Advised Chole to call RR back if no improvement or changes in care needs.   Lamona Curl, RN

## 2022-10-07 NOTE — Progress Notes (Signed)
   10/07/22 0059  Assess: MEWS Score  Temp (!) 102.1 F (38.9 C)  BP (!) 153/82  MAP (mmHg) 101  Pulse Rate (!) 135  Resp 18  Level of Consciousness Alert  SpO2 100 %  O2 Device Room Air  Assess: MEWS Score  MEWS Temp 2  MEWS Systolic 0  MEWS Pulse 3  MEWS RR 0  MEWS LOC 0  MEWS Score 5  MEWS Score Color Red  Assess: if the MEWS score is Yellow or Red  Were vital signs accurate and taken at a resting state? Yes  Does the patient meet 2 or more of the SIRS criteria? Yes  Does the patient have a confirmed or suspected source of infection? Yes  MEWS guidelines implemented  Yes, red  Treat  MEWS Interventions Considered administering scheduled or prn medications/treatments as ordered  Take Vital Signs  Increase Vital Sign Frequency  Red: Q1hr x2, continue Q4hrs until patient remains green for 12hrs  Escalate  MEWS: Escalate Red: Discuss with charge nurse and notify provider. Consider notifying RRT. If remains red for 2 hours consider need for higher level of care  Notify: Charge Nurse/RN  Name of Charge Nurse/RN Notified Research scientist (life sciences)  Provider Notification  Provider Name/Title Chinita Greenland, NP  Date Provider Notified 10/07/22  Time Provider Notified 0113  Method of Notification  (secure chat)  Notification Reason Change in status  Provider response No new orders  Date of Provider Response 10/07/22  Time of Provider Response 0118  Notify: Rapid Response  Name of Rapid Response RN Notified Mandy RN  Date Rapid Response Notified 10/07/22  Time Rapid Response Notified 0118  Assess: SIRS CRITERIA  SIRS Temperature  1  SIRS Pulse 1  SIRS Respirations  0  SIRS WBC 0  SIRS Score Sum  2

## 2022-10-07 NOTE — Consult Note (Signed)
[facility]    Date of Admission:  10/03/2022     Reason for Consult: diabetic foot infection, severe sepsis    Referring Provider: Jerral Ralph     Abx: 8/17-c vanc 8/17-c cefepime 8/17-c metronidazole         Assessment: 32 y.o. female dm1, peripheral neuropathy, admitted with sepsis and diabetic foot infection/om   Patient with aki/severe left sided soft tissue infection with gaseous changes in the ct scan Wbc rise maxed at 55 on 8/19  S/p initial bilateral feet I&D 8/18 -- cx ngtd  8/16 bcx ngtd 8/20 bcx pending   Wbc today had decreased to 45 but appears still septic with sinus tach     Plan: Continue current vanc/cefepime/flagyl Agree close monitoring needed for nec fasc. High risk amputation need Will add linezolid for a few days pending culture Discussed with primary team      ------------------------------------------------ Principal Problem:   Sepsis due to cellulitis [facility]) Active Problems:   Anemia   AKI (acute kidney injury) (HCC)   Diabetic gastroparesis associated with type 1 diabetes mellitus (HCC)   DM type 1 (diabetes mellitus, type 1) (HCC)   Drug-seeking behavior   Cellulitis of lower extremity    HPI: Kelli Hudson is a 32 y.o. female dm1, peripheral neuropathy, admitted with sepsis and diabetic foot infection/om  Patient developed bilateral plantar ulcer wound in her feet 2 weeks prior to presentation and was given doxycycline, but course didn't improve so admitted  Associated subjective f/c and malaise  On presentation 8/16: Febrile Wbc 27 Aki with cr up to 1.7 Started on vanc/cefepime/flagyl Bilateral feet ct scan showed gas changes and om changes left foot not right foot Abi normal  Podiatry consulted and she underwent intial I&D on 8/18 after wbc inreases to 50 and sign of worsening soft tissue infection  Cx ngtd  No pressors needed/used  She is still very tachycardic and  having fever today   Never had foot infection before  Bcx ngtd  Last fever 8/20 am and repeat bcx ordered Wbc had started to decrease  No focal pain otherwise     Family History  Problem Relation Age of Onset   Lung cancer Mother    Diabetes Mother    Bipolar disorder Father    Liver cancer Maternal Grandfather    Diabetes Maternal Aunt        x 2   Pancreatic cancer Maternal Uncle    Prostate cancer Maternal Uncle    Breast cancer Other        maternal great aunt   Heart disease Other    Ovarian cancer Other        maternal great aunt   Kidney disease Other        maternal great aunt   Colon cancer Neg Hx    Stomach cancer Neg Hx     Social History   Tobacco Use   Smoking status: Never   Smokeless tobacco: Never  Vaping Use   Vaping status: Never Used  Substance Use Topics   Alcohol use: Yes    Alcohol/week: 1.0 standard drink of alcohol    Types: 1 Shots of liquor per week    Comment: occasional    Drug use: No    Allergies  Allergen Reactions   Ibuprofen Other (See Comments)    07/23/22 - Worsening kidney function after 3 doses of ibuprofen 400mg .  Creatinine increased from 1.3 to  2.  Creatinine returned to normal after stopping.   Lisinopril Cough, Other (See Comments), Itching and Rash    Breakout in rash    Review of Systems: ROS All Other ROS was negative, except mentioned above   Past Medical History:  Diagnosis Date   Acute H. pylori gastric ulcer    Coffee ground emesis    Diabetes mellitus (HCC)    Diabetic gastroparesis (HCC)    DKA (diabetic ketoacidosis) (HCC) 02/24/2021   Gastroparesis    GERD (gastroesophageal reflux disease)    Hypertension    Hyperthyroidism    Intractable nausea and vomiting 04/20/2021   Normocytic anemia 04/20/2020   Prolonged Q-T interval on ECG        Scheduled Meds:  sodium chloride   Intravenous Once   Chlorhexidine Gluconate Cloth  6 each Topical Daily   DULoxetine  30 mg Oral Daily    feeding supplement (GLUCERNA SHAKE)  237 mL Oral BID BM   ferrous sulfate  325 mg Oral Daily   gabapentin  300 mg Oral TID   insulin aspart  0-9 Units Subcutaneous TID WC   metoCLOPramide (REGLAN) injection  10 mg Intravenous Q8H   nutrition supplement (JUVEN)  1 packet Oral BID BM   silver sulfADIAZINE   Topical Daily   sodium chloride flush  10-40 mL Intracatheter Q12H   Continuous Infusions:  ceFEPime (MAXIPIME) IV     lactated ringers 100 mL/hr at 10/07/22 1500   metronidazole Stopped (10/07/22 0511)   vancomycin 1,250 mg (10/07/22 1532)   PRN Meds:.acetaminophen **OR** acetaminophen, dicyclomine, HYDROmorphone (DILAUDID) injection, ondansetron (ZOFRAN) IV, mouth rinse, sodium chloride flush, sodium chloride flush   OBJECTIVE: Blood pressure (!) 105/52, pulse (!) 131, temperature 100.3 F (37.9 C), temperature source Oral, resp. rate 13, height 5\' 7"  (1.702 m), weight 84.8 kg, SpO2 100%.  Physical Exam  General/constitutional: ill appearing, conversant, cooperative, appears lethargic HEENT: Normocephalic, PER, Conj Clear, EOMI, Oropharynx clear Neck supple CV: rrr no mrg Lungs: clear to auscultation, normal respiratory effort Abd: Soft, Nontender Ext: no edema Skin: No Rash Neuro: nonfocal MSK: bilateral feet dressing intact with serosanguinous strike through; no proximal blistering/crepitus on legs  Lab Results Lab Results  Component Value Date   WBC 45.3 (H) 10/07/2022   HGB 8.2 (L) 10/07/2022   HCT 25.8 (L) 10/07/2022   MCV 90.5 10/07/2022   PLT 552 (H) 10/07/2022    Lab Results  Component Value Date   CREATININE 1.44 (H) 10/07/2022   BUN 20 10/07/2022   NA 130 (L) 10/07/2022   K 4.1 10/07/2022   CL 103 10/07/2022   CO2 20 (L) 10/07/2022    Lab Results  Component Value Date   ALT 12 10/07/2022   AST 14 (L) 10/07/2022   ALKPHOS 110 10/07/2022   BILITOT 0.6 10/07/2022      Microbiology: Recent Results (from the past 240 hour(s))  Culture, blood  (Routine x 2)     Status: None (Preliminary result)   Collection Time: 10/03/22  1:52 PM   Specimen: BLOOD RIGHT ARM  Result Value Ref Range Status   Specimen Description   Final    BLOOD RIGHT ARM Performed at [facility], [address] [place], [place] [address]    Special Requests   Final    BOTTLES DRAWN AEROBIC AND ANAEROBIC Blood Culture adequate volume Performed at [facility], [address], [place], [place] [address]    Culture   Final    NO GROWTH  4 DAYS Performed at [facility], [address] [place], [place] [address]    Report Status PENDING  Incomplete  SARS Coronavirus 2 by RT PCR (hospital order, performed in [facility] hospital lab) *cepheid single result test* Anterior Nasal Swab     Status: None   Collection Time: 10/03/22  1:54 PM   Specimen: Anterior Nasal Swab  Result Value Ref Range Status   SARS Coronavirus 2 by RT PCR NEGATIVE NEGATIVE Final    Comment: (NOTE) SARS-CoV-2 target nucleic acids are NOT DETECTED.  The SARS-CoV-2 RNA is generally detectable in upper and lower respiratory specimens during the acute phase of infection. The lowest concentration of SARS-CoV-2 viral copies this assay can detect is 250 copies / mL. A negative result does not preclude SARS-CoV-2 infection and should not be used as the sole basis for treatment or other patient management decisions.  A negative result may occur with improper specimen collection / handling, submission of specimen other than nasopharyngeal swab, presence of viral mutation(s) within the areas targeted by this assay, and inadequate number of viral copies (<250 copies / mL). A negative result must be combined with clinical observations, patient history, and epidemiological information.  Fact Sheet for Patients:   RoadLapTop.co.za  Fact Sheet for Healthcare Providers: http://kim-miller.com/  This test is not yet  approved or  cleared by the [place] FDA and has been authorized for detection and/or diagnosis of SARS-CoV-2 by FDA under an Emergency Use Authorization (EUA).  This EUA will remain in effect (meaning this test can be used) for the duration of the COVID-19 declaration under Section 564(b)(1) of the Act, 21 U.S.C. section 360bbb-3(b)(1), unless the authorization is terminated or revoked sooner.  Performed at [facility], [address] [place], [place] [address]   Aerobic/Anaerobic Culture w Gram Stain (surgical/deep wound)     Status: None (Preliminary result)   Collection Time: 10/05/22 10:48 AM   Specimen: Wound  Result Value Ref Range Status   Specimen Description   Final    WOUND WOUND, RIGHT FOOT Performed at [facility], [address], [place], [place] [address]    Special Requests   Final    NONE Performed at [facility], [address] [place], [place] [address]    Gram Stain   Final    MODERATE WBC PRESENT,BOTH PMN AND MONONUCLEAR NO ORGANISMS SEEN    Culture   Final    NO GROWTH 2 DAYS Performed at [facility], [address], [place], [place] [address]    Report Status PENDING  Incomplete  MRSA Next Gen by PCR, Nasal     Status: None   Collection Time: 10/07/22 12:20 PM   Specimen: Nasal Mucosa; Nasal Swab  Result Value Ref Range Status   MRSA by PCR Next Gen NOT DETECTED NOT DETECTED Final    Comment: (NOTE) The GeneXpert MRSA Assay (FDA approved for NASAL specimens only), is one component of a comprehensive MRSA colonization surveillance program. It is not intended to diagnose MRSA infection nor to guide or monitor treatment for MRSA infections. Test performance is not FDA approved in patients less than 54 years old. Performed at [facility], [address] [place], [place] [address]      Serology:    Imaging: If present, new imagings (plain films, ct  scans, and mri) have been personally visualized and interpreted; radiology reports have been reviewed. Decision making incorporated into the Impression /  Recommendations.  8/19 ct left foot 1. No CT evidence of osteomyelitis of the left foot. 2. 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  8/19 ct right foot 1. Diffuse soft tissue swelling with superficial soft tissue irregularity at the dorsum of the forefoot. There are numerous foci of air within the deep and superficial soft tissues of the forefoot, concerning for infection with gas-forming organism. 2. Possible erosion along the lateral margin of the first metatarsal head, potentially representing a site of osteomyelitis. MRI could be obtained to further assess, if indicated.   8/17 vas abi u/s Right: Resting right ankle-brachial index is within normal range. The  right toe-brachial index is normal.   Left: Resting left ankle-brachial index is within normal range. The left  toe-brachial index is normal.     Raymondo Band, MD [facility] [phone] pager    10/07/2022, 4:04 PM

## 2022-10-07 NOTE — Progress Notes (Signed)
PHARMACY NOTE:  ANTIMICROBIAL RENAL DOSAGE ADJUSTMENT  Current antimicrobial regimen includes a mismatch between antimicrobial dosage and estimated renal function.  As per policy approved by the Pharmacy & Therapeutics and Medical Executive Committees, the antimicrobial dosage will be adjusted accordingly.  Current antimicrobial dosage:  cefepime 2g q12  Indication: sepsis  Renal Function:  Estimated Creatinine Clearance: 63.4 mL/min (A) (by C-G formula based on SCr of 1.44 mg/dL (H)). []      On intermittent HD, scheduled: []      On CRRT    Antimicrobial dosage has been changed to:  cefepime 2g q8  Additional comments:   Thank you for allowing pharmacy to be a part of this patient's care.  Berkley Harvey, [facility] 10/07/2022 12:32 PM

## 2022-10-07 NOTE — Plan of Care (Signed)
  Problem: Activity: Goal: Risk for activity intolerance will decrease Outcome: Progressing   Problem: Elimination: Goal: Will not experience complications related to bowel motility Outcome: Progressing Goal: Will not experience complications related to urinary retention Outcome: Progressing   Problem: Pain Managment: Goal: General experience of comfort will improve Outcome: Progressing   

## 2022-10-07 NOTE — Progress Notes (Signed)
PROGRESS NOTE Kelli Hudson  WUJ:[id number] DOB: Dec 03, 1990 DOA: 10/03/2022 PCP: Norm Salt, PA  Brief Narrative/Hospital Course: 32 y.o. female with medical history significant of DM1, diabetic gastroparesis with very frequent admission in the past, HTN presented w/ c/o B foot wounds, fever, and pain and informed that she has been dealing with lower extremity wound for past 2 weeks ,followed with her podiatrist in the outpatient setting at [facility] there which she recently finished a course of doxycycline without improving her symptoms.  Started with blisters that ruptured and subsequently redness and streaking up her legs.     In the ED Tmax 102.5,  HR upto 115,BP soft, not hypoxic.  Labs with significant leukocytosis 13.3, lactic acid normal 0.9, hyponatremia 131 creatinine 1.6 bicarb 18 normal LFTs and lipase.  On imaging with chest x-ray, x-rays of right and left foot-erosions noted no osseous changes  Patient admitted for sepsis due to cellulitis/lower extremity wound, severe anemia, AKI. Podiatry consulted Unable to complete MRI as gastric pacemaker is not MRI safe 8/18: Taken to the OR for irrigation and debridement with incision and drainage of bilateral feet. 8/19: Continues to spike fever.  CT scans consistent with no acute infections right plantar aspect and possible first metatarsal osteomyelitis. 8/20: Remains more lethargic, soft blood pressure.  Transferring to stepdown unit.   Subjective: Patient seen and examined.  Overnight events noted.  She had temperature of 102.  She is also taking Dilaudid every 2 hours around-the-clock.  She was sleepy and dozing off on conversation.  Feels tired.  Denies any nausea vomiting or abdominal pain.  She tells me that her pain is mostly on her feet.   Repeat cultures Fluid boluses Transferred to stepdown Will discuss with ID  Assessment and Plan: Principal Problem:   Sepsis due to cellulitis (HCC) Active  Problems:   AKI (acute kidney injury) (HCC)   Cellulitis of lower extremity   Anemia   Diabetic gastroparesis associated with type 1 diabetes mellitus (HCC)   DM type 1 (diabetes mellitus, type 1) (HCC)   Drug-seeking behavior  Bilateral feet abscess and cellulitis  Sepsis POA due to above : Very high leukocyte counts.   ABI unremarkable.   MRI not conditional.   Continue vancomycin Flagyl and cefepime.  Status post I&D.  Surgical cultures pending.   CT scan with contrast shows fluid pockets and persistent infection right foot that will probably need radical debridement.  Podiatry following.   AKI on CKD 3a with metabolic acidosis Baseline creatinine seems to be around 1.19-1.3 back in March 2024. Likely in the setting of sepsis and wound infection.Holding antihypertensive avoid nephrotoxic medication, avoid hypotension.  Monitor intake output and trend labs, and gentle IV fluid hydration.  Trending down. Recent Labs    05/05/22 1305 05/06/22 0317 05/07/22 0601 05/08/22 0532 05/09/22 0612 10/03/22 1354 10/04/22 0409 10/05/22 0835 10/06/22 0308 10/07/22 0351  BUN 23* 21* 26* 27* 26* 31* 31* 22* 24* 20  CREATININE 1.42* 1.07* 1.72* 1.30* 1.19* 1.64* 1.76* 1.61* 1.59* 1.44*  CO2 25 20* 21* 20* 19* 18* 19* 18* 18* 20*    Anemia No obvious stigmata of GI bleeding, Hemoccult was negative.  Likely in the setting of chronic disease, infection.  Received total 2 units of blood during this hospitalization.  Hemoglobin has remained stable since then.  Recent Labs    03/24/22 0553 03/25/22 0543 10/04/22 1200 10/05/22 0835 10/05/22 2200 10/06/22 0308 10/07/22 0351  HGB 10.1*   < > 9.1*  7.1* 8.7* 8.9* 8.2*  MCV 93.9   < >  --  90.2  --  90.4 90.5  VITAMINB12 771  --   --   --   --   --   --   FOLATE 12.0  --   --   --   --   --   --   FERRITIN 18  --   --   --   --   --   --   TIBC 417  --   --   --   --   --   --   IRON 78  --   --   --   --   --   --   RETICCTPCT 2.3  --   --    --   --   --   --    < > = values in this interval not displayed.    Thrombocytosis: likely in the setting of infection.Monitor  Type 1 diabetes mellitus w/ hypoglycemia/uncontrolled hyperglycemia: she has brittle diabetes.  Blood sugars were low overnight.  On maintenance IV fluids with dextrose.  Keep on sliding scale insulin.  Discontinue long-acting insulin.   Diabetic gastroparesis s/p gastric pacemaker Recurrent admission for intractable nausea vomiting: Dilaudid seeking behavior. Continue scheduled Reglan 10mg  3 times daily,  prn zofraran, last QTc was normal on 8/16 admission. Continue symptomatic management.  She is tolerating diet today.  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 Placed on- PRN IV dilaudid if needed this admission for severe pain on her feet. Decrease dose and frequency of Dilaudid to every 4 hours.  Diarrhea: No more diarrhea.  Discontinue isolation precautions.  Difficult IV accecss: Rt Femoral line palced 10/04/22 pm urgently due to no iv access while hypoglycemic. PICC line placed 8/19.  Femoral line removed.  DVT prophylaxis: SCDs Start: 10/05/22 1253 Place and maintain sequential compression device Start: 10/04/22 0825 Code Status:   Code Status: Full Code Family Communication: Mother on the phone updated multiple times today. Patient status is: Inpatient because of sepsis/leg wound Level of care: Stepdown   Dispo: The patient is from: home w/ boyfriend and is disabled.            Anticipated disposition: TBD.  Not clinically stable. Objective: Vitals last 24 hrs: Vitals:   10/07/22 0942 10/07/22 0950 10/07/22 0954 10/07/22 1100  BP: (!) 87/54 (!) 90/56 (!) 90/47 90/60  Pulse: 85   (!) 120  Resp: 18   18  Temp: 99.5 F (37.5 C)   99.3 F (37.4 C)  TempSrc: Oral   Oral  SpO2: 94%     Weight:      Height:       Weight  change:   Physical Examination: General: Chronically sick looking.  Today more lethargic.  Alert awake and oriented, however dozing off on conversation. Cardiovascular: S1-S2 normal.  Regular rate rhythm. Respiratory: Bilateral clear.  No added sounds. Gastrointestinal: Soft and nontender.  Gastric stimulator present. Ext: Both feet wrapped, postop dressing.  Not removed by me. Neuro: Intact.   Medications reviewed:  Scheduled Meds:  sodium chloride   Intravenous Once   Chlorhexidine Gluconate Cloth  6 each Topical Daily   DULoxetine  30 mg Oral Daily   feeding supplement (GLUCERNA SHAKE)  237 mL Oral BID BM   ferrous sulfate  325 mg Oral Daily   gabapentin  300 mg Oral TID  insulin aspart  0-9 Units Subcutaneous TID WC   metoCLOPramide (REGLAN) injection  10 mg Intravenous Q8H   nutrition supplement (JUVEN)  1 packet Oral BID BM   silver sulfADIAZINE   Topical Daily   sodium chloride flush  10-40 mL Intracatheter Q12H   Continuous Infusions:  ceFEPime (MAXIPIME) IV 2 g (10/07/22 1137)   dextrose 5 % and 0.9 % NaCl     metronidazole 500 mg (10/07/22 0409)   vancomycin 1,250 mg (10/06/22 1514)     Diet Order             Diet Carb Modified Fluid consistency: Thin; Room service appropriate? Yes  Diet effective now                  Intake/Output Summary (Last 24 hours) at 10/07/2022 1217 Last data filed at 10/07/2022 1030 Gross per 24 hour  Intake 1222.5 ml  Output 1000 ml  Net 222.5 ml   Net IO Since Admission: 7,342.51 mL [10/07/22 1217]  Wt Readings from Last 3 Encounters:  10/03/22 84.8 kg  06/18/22 83.5 kg  06/16/22 83.9 kg     Unresulted Labs (From admission, onward)     Start     Ordered   10/07/22 0902  Culture, blood (Routine X 2) w Reflex to ID Panel  BLOOD CULTURE X 2,   R (with TIMED occurrences)     Question:  Patient immune status  Answer:  Normal   10/07/22 0902   10/05/22 1235  Gastrointestinal Panel by PCR , Stool  (Gastrointestinal Panel by  PCR, Stool                                                                                                                                                     **Does Not include CLOSTRIDIUM DIFFICILE testing. **If CDIFF testing is needed, place order from the "C Difficile Testing" order set.**)  Once,   R        10/05/22 1235   10/05/22 0500  CBC  Daily,   R      10/04/22 0818          Data Reviewed: I have personally reviewed following labs and imaging studies CBC: Recent Labs  Lab 10/03/22 1354 10/03/22 2029 10/04/22 0409 10/04/22 1200 10/05/22 0835 10/05/22 2200 10/06/22 0308 10/07/22 0351  WBC 31.3*  --  26.9*  --  42.2*  --  55.0* 45.3*  NEUTROABS 27.8*  --   --   --   --   --   --   --   HGB 6.4*   < > 6.8* 9.1* 7.1* 8.7* 8.9* 8.2*  HCT 20.2*   < > 21.6* 30.5* 22.2* 27.2* 28.2* 25.8*  MCV 89.0  --  93.9  --  90.2  --  90.4 90.5  PLT 566*  --  724*  --  558*  --  630* 552*   < > = values in this interval not displayed.   Basic Metabolic Panel: Recent Labs  Lab 10/03/22 1354 10/04/22 0409 10/05/22 0835 10/06/22 0308 10/07/22 0351  NA 130* 133* 133* 132* 130*  K 4.2 3.8 3.7 4.2 4.1  CL 103 106 107 105 103  CO2 18* 19* 18* 18* 20*  GLUCOSE 246* 160* 126* 199* 99  BUN 31* 31* 22* 24* 20  CREATININE 1.64* 1.76* 1.61* 1.59* 1.44*  CALCIUM 8.2* 8.3* 7.7* 8.1* 7.9*  MG  --   --   --   --  1.6*   GFR: Estimated Creatinine Clearance: 63.4 mL/min (A) (by C-G formula based on SCr of 1.44 mg/dL (H)). Liver Function Tests: Recent Labs  Lab 10/03/22 1354 10/07/22 0351  AST 11* 14*  ALT 8 12  ALKPHOS 108 110  BILITOT 0.3 0.6  PROT 6.2* 5.5*  ALBUMIN 2.1* <1.5*   Recent Labs  Lab 10/03/22 1354  LIPASE 11   Recent Results (from the past 240 hour(s))  Culture, blood (Routine x 2)     Status: None (Preliminary result)   Collection Time: 10/03/22  1:52 PM   Specimen: BLOOD RIGHT ARM  Result Value Ref Range Status   Specimen Description   Final    BLOOD RIGHT  ARM Performed at [facility], [address], [place], [place] [address]    Special Requests   Final    BOTTLES DRAWN AEROBIC AND ANAEROBIC Blood Culture adequate volume Performed at [facility], [address] [place], [place] [address]    Culture   Final    NO GROWTH 4 DAYS Performed at [facility], [address], [place], [place] [address]    Report Status PENDING  Incomplete  SARS Coronavirus 2 by RT PCR (hospital order, performed in [facility] hospital lab) *cepheid single result test* Anterior Nasal Swab     Status: None   Collection Time: 10/03/22  1:54 PM   Specimen: Anterior Nasal Swab  Result Value Ref Range Status   SARS Coronavirus 2 by RT PCR NEGATIVE NEGATIVE Final    Comment: (NOTE) SARS-CoV-2 target nucleic acids are NOT DETECTED.  The SARS-CoV-2 RNA is generally detectable in upper and lower respiratory specimens during the acute phase of infection. The lowest concentration of SARS-CoV-2 viral copies this assay can detect is 250 copies / mL. A negative result does not preclude SARS-CoV-2 infection and should not be used as the sole basis for treatment or other patient management decisions.  A negative result may occur with improper specimen collection / handling, submission of specimen other than nasopharyngeal swab, presence of viral mutation(s) within the areas targeted by this assay, and inadequate number of viral copies (<250 copies / mL). A negative result must be combined with clinical observations, patient history, and epidemiological information.  Fact Sheet for Patients:   RoadLapTop.co.za  Fact Sheet for Healthcare Providers: http://kim-miller.com/  This test is not yet approved or  cleared by the [place] FDA and has been authorized for detection and/or diagnosis of SARS-CoV-2 by FDA under an Emergency Use Authorization (EUA).  This EUA will remain in effect  (meaning this test can be used) for the duration of the COVID-19 declaration under Section 564(b)(1) of the Act, 21 U.S.C. section 360bbb-3(b)(1), unless the authorization is terminated or revoked sooner.  Performed at [facility], [address], [place], [place] [address]   Aerobic/Anaerobic  Culture w Gram Stain (surgical/deep wound)     Status: None (Preliminary result)   Collection Time: 10/05/22 10:48 AM   Specimen: Wound  Result Value Ref Range Status   Specimen Description   Final    WOUND WOUND, RIGHT FOOT Performed at [facility], [address], [place], [place] [address]    Special Requests   Final    NONE Performed at [facility], [address], [place], [place] [address]    Gram Stain   Final    MODERATE WBC PRESENT,BOTH PMN AND MONONUCLEAR NO ORGANISMS SEEN    Culture   Final    NO GROWTH 2 DAYS Performed at [facility], [address], [place], [place] [address]    Report Status PENDING  Incomplete    Antimicrobials: Anti-infectives (From admission, onward)    Start     Dose/Rate Route Frequency Ordered Stop   10/04/22 1600  vancomycin (VANCOREADY) IVPB 1250 mg/250 mL        1,250 mg 166.7 mL/hr over 90 Minutes Intravenous Every 24 hours 10/03/22 2312     10/04/22 0000  ceFEPIme (MAXIPIME) 2 g in sodium chloride 0.9 % 100 mL IVPB        2 g 200 mL/hr over 30 Minutes Intravenous Every 12 hours 10/03/22 2312     10/03/22 1530  vancomycin (VANCOCIN) 500 mg in sodium chloride 0.9 % 100 mL IVPB        500 mg 100 mL/hr over 60 Minutes Intravenous  Once 10/03/22 1418 10/03/22 2020   10/03/22 1430  vancomycin (VANCOCIN) IVPB 1000 mg/200 mL premix        1,000 mg 200 mL/hr over 60 Minutes Intravenous  Once 10/03/22 1417 10/03/22 1830   10/03/22 1415  metroNIDAZOLE (FLAGYL) IVPB 500 mg        500 mg 100 mL/hr over 60 Minutes Intravenous Every 12 hours 10/03/22 1413 10/10/22 1659   10/03/22 1415   ceFEPIme (MAXIPIME) 2 g in sodium chloride 0.9 % 100 mL IVPB        2 g 200 mL/hr over 30 Minutes Intravenous  Once 10/03/22 1417 10/03/22 1530      Culture/Microbiology    Component Value Date/Time   SDES  10/05/2022 1048    WOUND WOUND, RIGHT FOOT Performed at [facility], [address], [place], [place] [address]    SPECREQUEST  10/05/2022 1048    NONE Performed at [facility], [address] [place], [place] [address]    CULT  10/05/2022 1048    NO GROWTH 2 DAYS Performed at [facility], [address] [place], [place] [address]    REPTSTATUS PENDING 10/05/2022 1048    Other culture-see note  Radiology Studies: CT FOOT RIGHT W CONTRAST  Result Date: 10/07/2022 CLINICAL DATA:  Foot pain.  Osteomyelitis suspected, foot, xray done EXAM: CT OF THE LOWER RIGHT EXTREMITY WITH CONTRAST TECHNIQUE: Multidetector CT imaging of the lower right extremity was performed according to the standard protocol following intravenous contrast administration. RADIATION DOSE REDUCTION: This exam was performed according to the departmental dose-optimization program which includes automated exposure control, adjustment of the mA and/or kV according to patient size and/or use of iterative reconstruction technique. CONTRAST:  80mL OMNIPAQUE IOHEXOL 300 MG/ML  SOLN COMPARISON:  X-ray 10/03/2022 FINDINGS: Bones/Joint/Cartilage No acute fracture. No dislocation. Possible erosion along the lateral margin of the first metatarsal head. No additional sites of acute bone destruction or  periosteal elevation of the foot. Ligaments Suboptimally assessed by CT. Muscles and Tendons Denervation changes of the foot musculature.  No tenosynovitis. Soft tissues Diffuse soft tissue swelling. There is superficial soft tissue irregularity at the dorsum of the forefoot. There are numerous foci of air within the deep and superficial soft tissues of the forefoot. No organized or rim  enhancing fluid collections. IMPRESSION: 1. Diffuse soft tissue swelling with superficial soft tissue irregularity at the dorsum of the forefoot. There are numerous foci of air within the deep and superficial soft tissues of the forefoot, concerning for infection with gas-forming organism. 2. Possible erosion along the lateral margin of the first metatarsal head, potentially representing a site of osteomyelitis. MRI could be obtained to further assess, if indicated. Electronically Signed   By: Duanne Guess D.O.   On: 10/07/2022 09:35   CT FOOT LEFT W CONTRAST  Result Date: 10/07/2022 CLINICAL DATA:  Foot pain.  Osteomyelitis suspected, foot, xray done EXAM: CT OF THE LOWER LEFT EXTREMITY WITH CONTRAST TECHNIQUE: Multidetector CT imaging of the lower left extremity was performed according to the standard protocol following intravenous contrast administration. RADIATION DOSE REDUCTION: This exam was performed according to the departmental dose-optimization program which includes automated exposure control, adjustment of the mA and/or kV according to patient size and/or use of iterative reconstruction technique. CONTRAST:  80mL OMNIPAQUE IOHEXOL 300 MG/ML  SOLN COMPARISON:  X-ray 10/03/2022 FINDINGS: Bones/Joint/Cartilage Postsurgical changes from previous amputation of the fourth toe as well as a transmetatarsal amputation of the fifth ray. No focal site of erosion or periosteal elevation. No fracture or malalignment. Joint spaces are preserved. Ligaments Suboptimally assessed by CT. Muscles and Tendons Denervation changes of the foot musculature. No appreciable tenosynovial fluid collection. Soft tissues No organized or drainable fluid collections.  No soft tissue gas. IMPRESSION: 1. No CT evidence of osteomyelitis of the left foot. 2. 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 Electronically Signed   By: Duanne Guess D.O.    On: 10/07/2022 09:19     LOS: 4 days   Total time spent: 35 minutes  Dorcas Carrow, MD [facility]  10/07/2022, 12:17 PM

## 2022-10-08 ENCOUNTER — Other Ambulatory Visit: Payer: Self-pay

## 2022-10-08 ENCOUNTER — Encounter (HOSPITAL_COMMUNITY): Payer: Self-pay | Admitting: Internal Medicine

## 2022-10-08 ENCOUNTER — Inpatient Hospital Stay (HOSPITAL_COMMUNITY): Payer: Medicaid Other

## 2022-10-08 ENCOUNTER — Encounter (HOSPITAL_COMMUNITY)
Admission: EM | Disposition: A | Discharge status: Skilled Nursing Facility | Payer: Self-pay | Source: Home / Self Care | Attending: Internal Medicine

## 2022-10-08 ENCOUNTER — Inpatient Hospital Stay (HOSPITAL_COMMUNITY): Payer: Medicaid Other | Admitting: Certified Registered Nurse Anesthetist

## 2022-10-08 DIAGNOSIS — I10 Essential (primary) hypertension: Secondary | ICD-10-CM | POA: Diagnosis not present

## 2022-10-08 DIAGNOSIS — M726 Necrotizing fasciitis: Secondary | ICD-10-CM | POA: Diagnosis not present

## 2022-10-08 DIAGNOSIS — E785 Hyperlipidemia, unspecified: Secondary | ICD-10-CM

## 2022-10-08 DIAGNOSIS — E1121 Type 2 diabetes mellitus with diabetic nephropathy: Secondary | ICD-10-CM

## 2022-10-08 DIAGNOSIS — L0390 Cellulitis, unspecified: Secondary | ICD-10-CM | POA: Diagnosis not present

## 2022-10-08 DIAGNOSIS — R6520 Severe sepsis without septic shock: Secondary | ICD-10-CM | POA: Diagnosis not present

## 2022-10-08 DIAGNOSIS — A419 Sepsis, unspecified organism: Secondary | ICD-10-CM | POA: Diagnosis not present

## 2022-10-08 HISTORY — PX: AMPUTATION: SHX166

## 2022-10-08 LAB — GLUCOSE, CAPILLARY
Glucose-Capillary: 127 mg/dL — ABNORMAL HIGH (ref 70–99)
Glucose-Capillary: 127 mg/dL — ABNORMAL HIGH (ref 70–99)
Glucose-Capillary: 137 mg/dL — ABNORMAL HIGH (ref 70–99)
Glucose-Capillary: 141 mg/dL — ABNORMAL HIGH (ref 70–99)
Glucose-Capillary: 217 mg/dL — ABNORMAL HIGH (ref 70–99)

## 2022-10-08 LAB — CULTURE, BLOOD (ROUTINE X 2)
Culture: NO GROWTH
Special Requests: ADEQUATE

## 2022-10-08 LAB — POCT I-STAT 7, (LYTES, BLD GAS, ICA,H+H)
Acid-base deficit: 10 mmol/L — ABNORMAL HIGH (ref 0.0–2.0)
Acid-base deficit: 9 mmol/L — ABNORMAL HIGH (ref 0.0–2.0)
Acid-base deficit: 8 mmol/L — ABNORMAL HIGH (ref 0.0–2.0)
Bicarbonate: 16.4 mmol/L — ABNORMAL LOW (ref 20.0–28.0)
Bicarbonate: 17.4 mmol/L — ABNORMAL LOW (ref 20.0–28.0)
Bicarbonate: 17.3 mmol/L — ABNORMAL LOW (ref 20.0–28.0)
Calcium, Ion: 1.07 mmol/L — ABNORMAL LOW (ref 1.15–1.40)
Calcium, Ion: 1.17 mmol/L (ref 1.15–1.40)
Calcium, Ion: 1.17 mmol/L (ref 1.15–1.40)
HCT: 30 % — ABNORMAL LOW (ref 36.0–46.0)
HCT: 30 % — ABNORMAL LOW (ref 36.0–46.0)
HCT: 28 % — ABNORMAL LOW (ref 36.0–46.0)
Hemoglobin: 10.2 g/dL — ABNORMAL LOW (ref 12.0–15.0)
Hemoglobin: 10.2 g/dL — ABNORMAL LOW (ref 12.0–15.0)
Hemoglobin: 9.5 g/dL — ABNORMAL LOW (ref 12.0–15.0)
O2 Saturation: 99 %
O2 Saturation: 98 %
O2 Saturation: 100 %
Patient temperature: 98.8
Patient temperature: 35.6
Potassium: 4.8 mmol/L (ref 3.5–5.1)
Potassium: 4.9 mmol/L (ref 3.5–5.1)
Potassium: 4.5 mmol/L (ref 3.5–5.1)
Sodium: 132 mmol/L — ABNORMAL LOW (ref 135–145)
Sodium: 131 mmol/L — ABNORMAL LOW (ref 135–145)
Sodium: 135 mmol/L (ref 135–145)
TCO2: 18 mmol/L — ABNORMAL LOW (ref 22–32)
TCO2: 19 mmol/L — ABNORMAL LOW (ref 22–32)
TCO2: 18 mmol/L — ABNORMAL LOW (ref 22–32)
pCO2 arterial: 38 mmHg (ref 32–48)
pCO2 arterial: 39.9 mmHg (ref 32–48)
pCO2 arterial: 30 mmHg — ABNORMAL LOW (ref 32–48)
pH, Arterial: 7.244 — ABNORMAL LOW (ref 7.35–7.45)
pH, Arterial: 7.249 — ABNORMAL LOW (ref 7.35–7.45)
pH, Arterial: 7.362 (ref 7.35–7.45)
pO2, Arterial: 182 mmHg — ABNORMAL HIGH (ref 83–108)
pO2, Arterial: 125 mmHg — ABNORMAL HIGH (ref 83–108)
pO2, Arterial: 189 mmHg — ABNORMAL HIGH (ref 83–108)

## 2022-10-08 LAB — CBC
HCT: 21.3 % — ABNORMAL LOW (ref 36.0–46.0)
HCT: 29.4 % — ABNORMAL LOW (ref 36.0–46.0)
Hemoglobin: 6.8 g/dL — CL (ref 12.0–15.0)
Hemoglobin: 10 g/dL — ABNORMAL LOW (ref 12.0–15.0)
MCH: 29.2 pg (ref 26.0–34.0)
MCH: 28.8 pg (ref 26.0–34.0)
MCHC: 31.9 g/dL (ref 30.0–36.0)
MCHC: 34 g/dL (ref 30.0–36.0)
MCV: 91.4 fL (ref 80.0–100.0)
MCV: 84.7 fL (ref 80.0–100.0)
Platelets: 457 10*3/uL — ABNORMAL HIGH (ref 150–400)
Platelets: 347 10*3/uL (ref 150–400)
RBC: 2.33 MIL/uL — ABNORMAL LOW (ref 3.87–5.11)
RBC: 3.47 MIL/uL — ABNORMAL LOW (ref 3.87–5.11)
RDW: 16.2 % — ABNORMAL HIGH (ref 11.5–15.5)
RDW: 16.8 % — ABNORMAL HIGH (ref 11.5–15.5)
WBC: 46.9 10*3/uL — ABNORMAL HIGH (ref 4.0–10.5)
WBC: 43.1 10*3/uL — ABNORMAL HIGH (ref 4.0–10.5)
nRBC: 0 % (ref 0.0–0.2)
nRBC: 0 % (ref 0.0–0.2)

## 2022-10-08 LAB — MAGNESIUM
Magnesium: 1.7 mg/dL (ref 1.7–2.4)
Magnesium: 1.6 mg/dL — ABNORMAL LOW (ref 1.7–2.4)

## 2022-10-08 LAB — POCT I-STAT, CHEM 8
BUN: 22 mg/dL — ABNORMAL HIGH (ref 6–20)
Calcium, Ion: 1.08 mmol/L — ABNORMAL LOW (ref 1.15–1.40)
Chloride: 105 mmol/L (ref 98–111)
Creatinine, Ser: 1.9 mg/dL — ABNORMAL HIGH (ref 0.44–1.00)
Glucose, Bld: 153 mg/dL — ABNORMAL HIGH (ref 70–99)
HCT: 32 % — ABNORMAL LOW (ref 36.0–46.0)
Hemoglobin: 10.9 g/dL — ABNORMAL LOW (ref 12.0–15.0)
Potassium: 4.8 mmol/L (ref 3.5–5.1)
Sodium: 131 mmol/L — ABNORMAL LOW (ref 135–145)
TCO2: 18 mmol/L — ABNORMAL LOW (ref 22–32)

## 2022-10-08 LAB — HEMOGLOBIN AND HEMATOCRIT, BLOOD
HCT: 23.3 % — ABNORMAL LOW (ref 36.0–46.0)
Hemoglobin: 7.4 g/dL — ABNORMAL LOW (ref 12.0–15.0)

## 2022-10-08 LAB — DIC (DISSEMINATED INTRAVASCULAR COAGULATION)PANEL
D-Dimer, Quant: 4 ug{FEU}/mL — ABNORMAL HIGH (ref 0.00–0.50)
Fibrinogen: 535 mg/dL — ABNORMAL HIGH (ref 210–475)
INR: 2 — ABNORMAL HIGH (ref 0.8–1.2)
Platelets: 339 10*3/uL (ref 150–400)
Prothrombin Time: 22.9 seconds — ABNORMAL HIGH (ref 11.4–15.2)
Smear Review: NONE SEEN
aPTT: 38 seconds — ABNORMAL HIGH (ref 24–36)

## 2022-10-08 LAB — BASIC METABOLIC PANEL
Anion gap: 10 (ref 5–15)
Anion gap: 11 (ref 5–15)
BUN: 23 mg/dL — ABNORMAL HIGH (ref 6–20)
BUN: 23 mg/dL — ABNORMAL HIGH (ref 6–20)
CO2: 17 mmol/L — ABNORMAL LOW (ref 22–32)
CO2: 18 mmol/L — ABNORMAL LOW (ref 22–32)
Calcium: 7.9 mg/dL — ABNORMAL LOW (ref 8.9–10.3)
Calcium: 7.9 mg/dL — ABNORMAL LOW (ref 8.9–10.3)
Chloride: 102 mmol/L (ref 98–111)
Chloride: 103 mmol/L (ref 98–111)
Creatinine, Ser: 1.71 mg/dL — ABNORMAL HIGH (ref 0.44–1.00)
Creatinine, Ser: 1.93 mg/dL — ABNORMAL HIGH (ref 0.44–1.00)
GFR, Estimated: 41 mL/min — ABNORMAL LOW (ref >60)
GFR, Estimated: 35 mL/min — ABNORMAL LOW (ref >60)
Glucose, Bld: 125 mg/dL — ABNORMAL HIGH (ref 70–99)
Glucose, Bld: 171 mg/dL — ABNORMAL HIGH (ref 70–99)
Potassium: 4.5 mmol/L (ref 3.5–5.1)
Potassium: 4.9 mmol/L (ref 3.5–5.1)
Sodium: 129 mmol/L — ABNORMAL LOW (ref 135–145)
Sodium: 132 mmol/L — ABNORMAL LOW (ref 135–145)

## 2022-10-08 LAB — PROTIME-INR
INR: 2 — ABNORMAL HIGH (ref 0.8–1.2)
Prothrombin Time: 23.3 seconds — ABNORMAL HIGH (ref 11.4–15.2)

## 2022-10-08 LAB — PREPARE RBC (CROSSMATCH)

## 2022-10-08 LAB — D-DIMER, QUANTITATIVE: D-Dimer, Quant: 4.57 ug{FEU}/mL — ABNORMAL HIGH (ref 0.00–0.50)

## 2022-10-08 LAB — TROPONIN I (HIGH SENSITIVITY)
Troponin I (High Sensitivity): 63 ng/L — ABNORMAL HIGH (ref <18)
Troponin I (High Sensitivity): 55 ng/L — ABNORMAL HIGH (ref <18)

## 2022-10-08 SURGERY — AMPUTATION BELOW KNEE
Anesthesia: General | Site: Leg Lower | Laterality: Bilateral

## 2022-10-08 SURGERY — IRRIGATION AND DEBRIDEMENT EXTREMITY
Anesthesia: General | Laterality: Bilateral

## 2022-10-08 MED ORDER — MIDAZOLAM HCL 2 MG/2ML IJ SOLN
INTRAMUSCULAR | Status: DC | PRN
  Administered 2022-10-08: 2 mg via INTRAVENOUS

## 2022-10-08 MED ORDER — LIDOCAINE 2% (20 MG/ML) 5 ML SYRINGE
INTRAMUSCULAR | Status: AC
  Filled 2022-10-08: qty 5

## 2022-10-08 MED ORDER — NOREPINEPHRINE BITARTRATE 1 MG/ML IV SOLN
INTRAVENOUS | Status: DC | PRN
  Administered 2022-10-08: 2 mL via INTRAVENOUS
  Administered 2022-10-08: 1 mL via INTRAVENOUS

## 2022-10-08 MED ORDER — FENTANYL BOLUS VIA INFUSION
50.0000 ug | INTRAVENOUS | Status: DC | PRN
  Administered 2022-10-09: 50 ug via INTRAVENOUS
  Administered 2022-10-10 (×2): 75 ug via INTRAVENOUS
  Administered 2022-10-10: 25 ug via INTRAVENOUS

## 2022-10-08 MED ORDER — HYDRALAZINE HCL 20 MG/ML IJ SOLN
5.0000 mg | INTRAMUSCULAR | Status: AC | PRN
  Administered 2022-10-14 – 2022-10-21 (×2): 5 mg via INTRAVENOUS
  Filled 2022-10-08 (×2): qty 1

## 2022-10-08 MED ORDER — BISACODYL 5 MG PO TBEC: 5.0000 mg | DELAYED_RELEASE_TABLET | Freq: Every day | ORAL | Status: DC | PRN

## 2022-10-08 MED ORDER — GABAPENTIN 250 MG/5ML PO SOLN
300.0000 mg | Freq: Three times a day (TID) | ORAL | Status: DC
  Administered 2022-10-08 – 2022-10-15 (×19): 300 mg
  Filled 2022-10-08 (×25): qty 6

## 2022-10-08 MED ORDER — INSULIN ASPART 100 UNIT/ML IJ SOLN: 0.0000 [IU] | INTRAMUSCULAR | Status: DC | PRN

## 2022-10-08 MED ORDER — POLYETHYLENE GLYCOL 3350 17 G PO PACK: 17.0000 g | PACK | Freq: Every day | ORAL | Status: DC | PRN

## 2022-10-08 MED ORDER — STERILE WATER FOR INJECTION IV SOLN
INTRAVENOUS | Status: DC
  Filled 2022-10-08: qty 1000

## 2022-10-08 MED ORDER — SODIUM BICARBONATE 8.4 % IV SOLN
INTRAVENOUS | Status: DC
  Filled 2022-10-08: qty 1000
  Filled 2022-10-08: qty 150
  Filled 2022-10-08 (×2): qty 1000

## 2022-10-08 MED ORDER — POVIDONE-IODINE 10 % EX SWAB
2.0000 | Freq: Once | CUTANEOUS | Status: AC
  Administered 2022-10-08: 2 via TOPICAL

## 2022-10-08 MED ORDER — CHLORHEXIDINE GLUCONATE 0.12 % MT SOLN
15.0000 mL | Freq: Once | OROMUCOSAL | Status: AC
  Administered 2022-10-08: 15 mL via OROMUCOSAL
  Filled 2022-10-08: qty 15

## 2022-10-08 MED ORDER — LIDOCAINE 2% (20 MG/ML) 5 ML SYRINGE
INTRAMUSCULAR | Status: DC | PRN
  Administered 2022-10-08: 60 mg via INTRAVENOUS

## 2022-10-08 MED ORDER — LACTATED RINGERS IV BOLUS
1000.0000 mL | Freq: Once | INTRAVENOUS | Status: AC
  Administered 2022-10-08: 1000 mL via INTRAVENOUS

## 2022-10-08 MED ORDER — SODIUM CHLORIDE 0.9% IV SOLUTION: Freq: Once | INTRAVENOUS | Status: DC

## 2022-10-08 MED ORDER — HYDROMORPHONE HCL 1 MG/ML IJ SOLN
INTRAMUSCULAR | Status: DC | PRN
  Administered 2022-10-08: .5 mg via INTRAVENOUS

## 2022-10-08 MED ORDER — POTASSIUM CHLORIDE CRYS ER 20 MEQ PO TBCR: 20.0000 meq | EXTENDED_RELEASE_TABLET | Freq: Every day | ORAL | Status: DC | PRN

## 2022-10-08 MED ORDER — FENTANYL CITRATE (PF) 250 MCG/5ML IJ SOLN
INTRAMUSCULAR | Status: AC
  Filled 2022-10-08: qty 5

## 2022-10-08 MED ORDER — HYDROMORPHONE HCL 1 MG/ML IJ SOLN
INTRAMUSCULAR | Status: AC
  Filled 2022-10-08: qty 0.5

## 2022-10-08 MED ORDER — VITAMIN C 500 MG PO TABS: 1000.0000 mg | ORAL_TABLET | Freq: Every day | ORAL | Status: DC

## 2022-10-08 MED ORDER — CHLORHEXIDINE GLUCONATE 4 % EX SOLN: 60.0000 mL | Freq: Once | CUTANEOUS | Status: DC

## 2022-10-08 MED ORDER — 0.9 % SODIUM CHLORIDE (POUR BTL) OPTIME
TOPICAL | Status: DC | PRN
  Administered 2022-10-08: 1000 mL

## 2022-10-08 MED ORDER — SODIUM CHLORIDE 0.9 % IV SOLN
2.0000 g | Freq: Two times a day (BID) | INTRAVENOUS | Status: DC
  Administered 2022-10-08 – 2022-10-16 (×16): 2 g via INTRAVENOUS
  Filled 2022-10-08 (×17): qty 12.5

## 2022-10-08 MED ORDER — DIPHENHYDRAMINE HCL 50 MG/ML IJ SOLN
25.0000 mg | Freq: Once | INTRAMUSCULAR | Status: AC
  Administered 2022-10-08: 25 mg via INTRAVENOUS
  Filled 2022-10-08: qty 1

## 2022-10-08 MED ORDER — NOREPINEPHRINE 4 MG/250ML-% IV SOLN
INTRAVENOUS | Status: DC | PRN
  Administered 2022-10-08: 5 ug/min via INTRAVENOUS

## 2022-10-08 MED ORDER — GUAIFENESIN-DM 100-10 MG/5ML PO SYRP: 15.0000 mL | ORAL_SOLUTION | ORAL | Status: DC | PRN

## 2022-10-08 MED ORDER — SODIUM CHLORIDE 0.9 % IV SOLN: INTRAVENOUS | Status: DC | PRN

## 2022-10-08 MED ORDER — MAGNESIUM CITRATE PO SOLN: 1.0000 | Freq: Once | ORAL | Status: DC | PRN

## 2022-10-08 MED ORDER — ALBUMIN HUMAN 5 % IV SOLN: INTRAVENOUS | Status: DC | PRN

## 2022-10-08 MED ORDER — MIDAZOLAM HCL 2 MG/2ML IJ SOLN
INTRAMUSCULAR | Status: AC
  Filled 2022-10-08: qty 2

## 2022-10-08 MED ORDER — DEXAMETHASONE SODIUM PHOSPHATE 10 MG/ML IJ SOLN
INTRAMUSCULAR | Status: DC | PRN
  Administered 2022-10-08: 5 mg via INTRAVENOUS

## 2022-10-08 MED ORDER — INSULIN ASPART 100 UNIT/ML IJ SOLN
0.0000 [IU] | INTRAMUSCULAR | Status: DC
  Administered 2022-10-08: 1 [IU] via SUBCUTANEOUS
  Administered 2022-10-09 (×3): 2 [IU] via SUBCUTANEOUS
  Administered 2022-10-09: 1 [IU] via SUBCUTANEOUS
  Administered 2022-10-09: 2 [IU] via SUBCUTANEOUS
  Administered 2022-10-09: 3 [IU] via SUBCUTANEOUS
  Administered 2022-10-10: 2 [IU] via SUBCUTANEOUS
  Administered 2022-10-10: 3 [IU] via SUBCUTANEOUS
  Administered 2022-10-10 – 2022-10-11 (×4): 2 [IU] via SUBCUTANEOUS
  Administered 2022-10-11: 3 [IU] via SUBCUTANEOUS
  Administered 2022-10-11 – 2022-10-12 (×3): 2 [IU] via SUBCUTANEOUS
  Administered 2022-10-12: 1 [IU] via SUBCUTANEOUS
  Administered 2022-10-12 – 2022-10-13 (×3): 2 [IU] via SUBCUTANEOUS
  Administered 2022-10-13: 1 [IU] via SUBCUTANEOUS
  Administered 2022-10-14: 3 [IU] via SUBCUTANEOUS
  Administered 2022-10-14: 2 [IU] via SUBCUTANEOUS
  Administered 2022-10-14: 1 [IU] via SUBCUTANEOUS
  Administered 2022-10-14 – 2022-10-15 (×3): 3 [IU] via SUBCUTANEOUS
  Administered 2022-10-15: 2 [IU] via SUBCUTANEOUS

## 2022-10-08 MED ORDER — DOCUSATE SODIUM 50 MG/5ML PO LIQD
100.0000 mg | Freq: Two times a day (BID) | ORAL | Status: DC
  Administered 2022-10-08 – 2022-10-11 (×5): 100 mg
  Filled 2022-10-08 (×6): qty 10

## 2022-10-08 MED ORDER — DOCUSATE SODIUM 100 MG PO CAPS: 100.0000 mg | ORAL_CAPSULE | Freq: Every day | ORAL | Status: DC

## 2022-10-08 MED ORDER — DOCUSATE SODIUM 50 MG/5ML PO LIQD: 100.0000 mg | Freq: Two times a day (BID) | ORAL | Status: DC

## 2022-10-08 MED ORDER — MAGNESIUM SULFATE 2 GM/50ML IV SOLN
2.0000 g | Freq: Every day | INTRAVENOUS | Status: AC | PRN
  Administered 2022-10-08: 2 g via INTRAVENOUS
  Filled 2022-10-08: qty 50

## 2022-10-08 MED ORDER — ZINC SULFATE 220 (50 ZN) MG PO CAPS
220.0000 mg | ORAL_CAPSULE | Freq: Every day | ORAL | Status: DC
  Administered 2022-10-09 – 2022-10-15 (×6): 220 mg
  Filled 2022-10-08 (×6): qty 1

## 2022-10-08 MED ORDER — ORAL CARE MOUTH RINSE: 15.0000 mL | OROMUCOSAL | Status: DC | PRN

## 2022-10-08 MED ORDER — EPHEDRINE 5 MG/ML INJ
INTRAVENOUS | Status: AC
  Filled 2022-10-08: qty 5

## 2022-10-08 MED ORDER — SUCCINYLCHOLINE CHLORIDE 200 MG/10ML IV SOSY
PREFILLED_SYRINGE | INTRAVENOUS | Status: AC
  Filled 2022-10-08: qty 10

## 2022-10-08 MED ORDER — PROPOFOL 1000 MG/100ML IV EMUL
0.0000 ug/kg/min | INTRAVENOUS | Status: DC
  Administered 2022-10-08: 50 ug/kg/min via INTRAVENOUS
  Administered 2022-10-08: 40 ug/kg/min via INTRAVENOUS
  Administered 2022-10-09: 30 ug/kg/min via INTRAVENOUS
  Filled 2022-10-08: qty 100
  Filled 2022-10-08: qty 200

## 2022-10-08 MED ORDER — PANTOPRAZOLE SODIUM 40 MG PO TBEC
40.0000 mg | DELAYED_RELEASE_TABLET | Freq: Every day | ORAL | Status: DC
  Administered 2022-10-08: 40 mg via ORAL
  Filled 2022-10-08: qty 1

## 2022-10-08 MED ORDER — VASHE WOUND IRRIGATION OPTIME
TOPICAL | Status: DC | PRN
Start: 2022-10-08 — End: 2022-10-08
  Administered 2022-10-08 (×3): 34 [oz_av]

## 2022-10-08 MED ORDER — CEFAZOLIN SODIUM-DEXTROSE 2-4 GM/100ML-% IV SOLN: 2.0000 g | INTRAVENOUS | Status: DC

## 2022-10-08 MED ORDER — ORAL CARE MOUTH RINSE
15.0000 mL | OROMUCOSAL | Status: DC
  Administered 2022-10-08 – 2022-10-10 (×21): 15 mL via OROMUCOSAL

## 2022-10-08 MED ORDER — OXYCODONE HCL 5 MG PO TABS: 5.0000 mg | ORAL_TABLET | ORAL | Status: DC | PRN

## 2022-10-08 MED ORDER — POLYETHYLENE GLYCOL 3350 17 G PO PACK
17.0000 g | PACK | Freq: Every day | ORAL | Status: DC
  Administered 2022-10-09 – 2022-10-16 (×3): 17 g
  Filled 2022-10-08 (×3): qty 1

## 2022-10-08 MED ORDER — FENTANYL CITRATE (PF) 250 MCG/5ML IJ SOLN
INTRAMUSCULAR | Status: DC | PRN
  Administered 2022-10-08 (×2): 100 ug via INTRAVENOUS
  Administered 2022-10-08: 50 ug via INTRAVENOUS
  Administered 2022-10-08: 100 ug via INTRAVENOUS
  Administered 2022-10-08 (×3): 50 ug via INTRAVENOUS

## 2022-10-08 MED ORDER — ALUM & MAG HYDROXIDE-SIMETH 200-200-20 MG/5ML PO SUSP: 15.0000 mL | ORAL | Status: DC | PRN

## 2022-10-08 MED ORDER — SODIUM CHLORIDE 0.9 % IV SOLN: INTRAVENOUS | Status: DC

## 2022-10-08 MED ORDER — INSULIN DETEMIR 100 UNIT/ML ~~LOC~~ SOLN
5.0000 [IU] | Freq: Every day | SUBCUTANEOUS | Status: DC
  Administered 2022-10-08: 5 [IU] via SUBCUTANEOUS
  Filled 2022-10-08 (×2): qty 0.05

## 2022-10-08 MED ORDER — PHENYLEPHRINE 80 MCG/ML (10ML) SYRINGE FOR IV PUSH (FOR BLOOD PRESSURE SUPPORT)
PREFILLED_SYRINGE | INTRAVENOUS | Status: AC
  Filled 2022-10-08: qty 10

## 2022-10-08 MED ORDER — SUCCINYLCHOLINE CHLORIDE 200 MG/10ML IV SOSY
PREFILLED_SYRINGE | INTRAVENOUS | Status: DC | PRN
  Administered 2022-10-08: 150 mg via INTRAVENOUS

## 2022-10-08 MED ORDER — ACETAMINOPHEN 325 MG PO TABS
325.0000 mg | ORAL_TABLET | Freq: Four times a day (QID) | ORAL | Status: DC | PRN
  Administered 2022-10-10 (×2): 650 mg via ORAL
  Filled 2022-10-08 (×2): qty 2

## 2022-10-08 MED ORDER — VASOPRESSIN 20 UNITS/100 ML INFUSION FOR SHOCK: 0.0000 [IU]/min | INTRAVENOUS | Status: DC

## 2022-10-08 MED ORDER — LACTATED RINGERS IV SOLN: INTRAVENOUS | Status: DC

## 2022-10-08 MED ORDER — LABETALOL HCL 5 MG/ML IV SOLN
10.0000 mg | INTRAVENOUS | Status: AC | PRN
  Administered 2022-10-10 – 2022-10-15 (×4): 10 mg via INTRAVENOUS
  Filled 2022-10-08 (×4): qty 4

## 2022-10-08 MED ORDER — OXYCODONE HCL 5 MG PO TABS: 10.0000 mg | ORAL_TABLET | ORAL | Status: DC | PRN

## 2022-10-08 MED ORDER — SODIUM BICARBONATE 8.4 % IV SOLN
100.0000 meq | Freq: Once | INTRAVENOUS | Status: AC
  Administered 2022-10-08: 100 meq via INTRAVENOUS
  Filled 2022-10-08: qty 100

## 2022-10-08 MED ORDER — METOPROLOL TARTRATE 5 MG/5ML IV SOLN: 2.0000 mg | INTRAVENOUS | Status: DC | PRN

## 2022-10-08 MED ORDER — ROCURONIUM BROMIDE 10 MG/ML (PF) SYRINGE
PREFILLED_SYRINGE | INTRAVENOUS | Status: DC | PRN
  Administered 2022-10-08 (×2): 50 mg via INTRAVENOUS

## 2022-10-08 MED ORDER — PHENOL 1.4 % MT LIQD: 1.0000 | OROMUCOSAL | Status: DC | PRN

## 2022-10-08 MED ORDER — FENTANYL 2500MCG IN NS 250ML (10MCG/ML) PREMIX INFUSION
50.0000 ug/h | INTRAVENOUS | Status: DC
  Administered 2022-10-08: 50 ug/h via INTRAVENOUS
  Administered 2022-10-10: 100 ug/h via INTRAVENOUS
  Filled 2022-10-08 (×2): qty 250

## 2022-10-08 MED ORDER — FAMOTIDINE 20 MG PO TABS
20.0000 mg | ORAL_TABLET | Freq: Two times a day (BID) | ORAL | Status: DC
  Administered 2022-10-08 – 2022-10-09 (×2): 20 mg
  Filled 2022-10-08 (×2): qty 1

## 2022-10-08 MED ORDER — ROCURONIUM BROMIDE 10 MG/ML (PF) SYRINGE
PREFILLED_SYRINGE | INTRAVENOUS | Status: AC
  Filled 2022-10-08: qty 10

## 2022-10-08 MED ORDER — VITAMIN C 500 MG PO TABS
1000.0000 mg | ORAL_TABLET | Freq: Every day | ORAL | Status: DC
  Administered 2022-10-08: 1000 mg via ORAL
  Filled 2022-10-08: qty 2

## 2022-10-08 MED ORDER — PROPOFOL 10 MG/ML IV BOLUS
INTRAVENOUS | Status: DC | PRN
Start: 2022-10-08 — End: 2022-10-08
  Administered 2022-10-08: 100 mg via INTRAVENOUS
  Administered 2022-10-08: 50 mg via INTRAVENOUS

## 2022-10-08 MED ORDER — ZINC SULFATE 220 (50 ZN) MG PO CAPS
220.0000 mg | ORAL_CAPSULE | Freq: Every day | ORAL | Status: DC
  Administered 2022-10-08: 220 mg via ORAL
  Filled 2022-10-08: qty 1

## 2022-10-08 MED ORDER — VASOPRESSIN 20 UNIT/ML IV SOLN
INTRAVENOUS | Status: DC | PRN
Start: 2022-10-08 — End: 2022-10-08
  Administered 2022-10-08: 1 [IU] via INTRAVENOUS

## 2022-10-08 MED ORDER — FENTANYL CITRATE PF 50 MCG/ML IJ SOSY: 50.0000 ug | PREFILLED_SYRINGE | Freq: Once | INTRAMUSCULAR | Status: DC

## 2022-10-08 MED ORDER — PHENYLEPHRINE 80 MCG/ML (10ML) SYRINGE FOR IV PUSH (FOR BLOOD PRESSURE SUPPORT)
PREFILLED_SYRINGE | INTRAVENOUS | Status: DC | PRN
  Administered 2022-10-08: 160 ug via INTRAVENOUS
  Administered 2022-10-08: 80 ug via INTRAVENOUS

## 2022-10-08 MED ORDER — ONDANSETRON HCL 4 MG/2ML IJ SOLN
4.0000 mg | Freq: Four times a day (QID) | INTRAMUSCULAR | Status: DC | PRN
  Administered 2022-10-15 – 2022-10-21 (×4): 4 mg via INTRAVENOUS
  Filled 2022-10-08 (×4): qty 2

## 2022-10-08 MED ORDER — ORAL CARE MOUTH RINSE: 15.0000 mL | Freq: Once | OROMUCOSAL | Status: AC

## 2022-10-08 MED ORDER — HYDROMORPHONE HCL 1 MG/ML IJ SOLN: 0.5000 mg | INTRAMUSCULAR | Status: DC | PRN

## 2022-10-08 MED ORDER — JUVEN PO PACK: 1.0000 | PACK | Freq: Two times a day (BID) | ORAL | Status: DC

## 2022-10-08 MED ORDER — NOREPINEPHRINE 4 MG/250ML-% IV SOLN
0.0000 ug/min | INTRAVENOUS | Status: DC
  Administered 2022-10-09: 2 ug/min via INTRAVENOUS

## 2022-10-08 MED ORDER — VASHE WOUND IRRIGATION OPTIME
TOPICAL | Status: DC | PRN
  Administered 2022-10-08 (×2): 34 [oz_av]

## 2022-10-08 MED ORDER — POLYETHYLENE GLYCOL 3350 17 G PO PACK: 17.0000 g | PACK | Freq: Every day | ORAL | Status: DC

## 2022-10-08 SURGICAL SUPPLY — 52 items
BAG COUNTER SPONGE SURGICOUNT (BAG) IMPLANT
BAG SPNG CNTER NS LX DISP (BAG)
BLADE SAW RECIP 87.9 MT (BLADE) ×1 IMPLANT
BLADE SURG 21 STRL SS (BLADE) ×1 IMPLANT
BNDG CMPR 5X4 CHSV STRCH STRL (GAUZE/BANDAGES/DRESSINGS) ×1
BNDG CMPR 5X6 CHSV STRCH STRL (GAUZE/BANDAGES/DRESSINGS)
BNDG COHESIVE 4X5 TAN STRL LF (GAUZE/BANDAGES/DRESSINGS) IMPLANT
BNDG COHESIVE 6X5 TAN ST LF (GAUZE/BANDAGES/DRESSINGS) IMPLANT
BNDG GAUZE DERMACEA FLUFF 4 (GAUZE/BANDAGES/DRESSINGS) IMPLANT
BNDG GZE DERMACEA 4 6PLY (GAUZE/BANDAGES/DRESSINGS) ×3
CANISTER WOUND CARE 500ML ATS (WOUND CARE) ×1 IMPLANT
CLEANSER WND VASHE 34 (WOUND CARE) IMPLANT
CONNECTOR Y WND VAC (MISCELLANEOUS) IMPLANT
COVER SURGICAL LIGHT HANDLE (MISCELLANEOUS) ×1 IMPLANT
CUFF TOURN SGL QUICK 34 (TOURNIQUET CUFF)
CUFF TRNQT CYL 34X4.125X (TOURNIQUET CUFF) ×1 IMPLANT
DRAPE DERMATAC (DRAPES) IMPLANT
DRAPE INCISE IOBAN 66X45 STRL (DRAPES) ×1 IMPLANT
DRAPE U-SHAPE 47X51 STRL (DRAPES) ×1 IMPLANT
DRESSING PREVENA PLUS CUSTOM (GAUZE/BANDAGES/DRESSINGS) ×1 IMPLANT
DRESSING VERAFLO CLEANS CC MED (GAUZE/BANDAGES/DRESSINGS) IMPLANT
DRSG PREVENA PLUS CUSTOM (GAUZE/BANDAGES/DRESSINGS)
DRSG VERAFLO CLEANSE CC MED (GAUZE/BANDAGES/DRESSINGS) ×4
DURAPREP 26ML APPLICATOR (WOUND CARE) ×1 IMPLANT
ELECT REM PT RETURN 9FT ADLT (ELECTROSURGICAL) ×1
ELECTRODE REM PT RTRN 9FT ADLT (ELECTROSURGICAL) ×1 IMPLANT
GLOVE BIOGEL PI IND STRL 9 (GLOVE) ×1 IMPLANT
GLOVE SURG ORTHO 9.0 STRL STRW (GLOVE) ×1 IMPLANT
GOWN STRL REUS W/ TWL XL LVL3 (GOWN DISPOSABLE) ×2 IMPLANT
GOWN STRL REUS W/TWL XL LVL3 (GOWN DISPOSABLE) ×2
GRAFT SKIN WND MICRO 38 (Tissue) IMPLANT
GRAFT SKIN WND SURGICLOSE M95 (Tissue) IMPLANT
KIT BASIN OR (CUSTOM PROCEDURE TRAY) ×1 IMPLANT
KIT TURNOVER KIT B (KITS) ×1 IMPLANT
MANIFOLD NEPTUNE II (INSTRUMENTS) ×1 IMPLANT
NS IRRIG 1000ML POUR BTL (IV SOLUTION) ×1 IMPLANT
PACK ORTHO EXTREMITY (CUSTOM PROCEDURE TRAY) ×1 IMPLANT
PAD ARMBOARD 7.5X6 YLW CONV (MISCELLANEOUS) ×1 IMPLANT
PAD NEG PRESSURE SENSATRAC (MISCELLANEOUS) IMPLANT
PREVENA RESTOR ARTHOFORM 46X30 (CANNISTER) ×1 IMPLANT
SPONGE T-LAP 18X18 ~~LOC~~+RFID (SPONGE) IMPLANT
STAPLER VISISTAT 35W (STAPLE) IMPLANT
STOCKINETTE 6 STRL (DRAPES) IMPLANT
STOCKINETTE IMPERVIOUS LG (DRAPES) ×1 IMPLANT
SUT ETHILON 2 0 PSLX (SUTURE) IMPLANT
SUT ETHILON 2 LR (SUTURE) IMPLANT
SUT SILK 2 0 (SUTURE) ×1
SUT SILK 2-0 18XBRD TIE 12 (SUTURE) ×1 IMPLANT
SUT VIC AB 1 CTX 27 (SUTURE) ×2 IMPLANT
TOWEL GREEN STERILE (TOWEL DISPOSABLE) ×1 IMPLANT
TUBE CONNECTING 12X1/4 (SUCTIONS) ×1 IMPLANT
YANKAUER SUCT BULB TIP NO VENT (SUCTIONS) ×1 IMPLANT

## 2022-10-08 NOTE — Plan of Care (Signed)

## 2022-10-08 NOTE — Consult Note (Addendum)
NAME:  Kelli Hudson, MRN:  [id number], DOB:  February 27, 1990, LOS: 5 ADMISSION DATE:  10/03/2022, CONSULTATION DATE:  10/08/22 REFERRING MD:  Lajoyce Corners, CHIEF COMPLAINT: nec fasc   History of Present Illness:  [name] PMH Dm1 diabetic gastroparesis now s/p gastric pacemaker, HTN admitted 8/17 with leg swelling -- BLE foot wounds, which  were being followed outpt by podiatry. Admitted to [facility] for sepsis in this setting, OR with podiatry 8/18 for I&D BLE foot wounds.   ID was consulted 8/20 for ongoing fever. Concern for nec fasc 8/21 ortho consulted -- transferred to [facility], underwent excision and debridement of BLE --extending feet to thighs. EBL from this case is not recorded but pt did receive 4 PRBC 2 FFP as well as crystalloid + colloid, as well as pressors for hemodynamic support .   Remains intubated after case  PCCM consulted in this setting   Pertinent  Medical History  DM1 Diabetic gastroparesis S/p gastric pacemaker  HTN  Significant Hospital Events: Including procedures, antibiotic start and stop dates in addition to other pertinent events   8/17 admit to [facility] sepsis BLE wound infections. Podiatry consult 8/18 I&D with podiatry.  8/20 ID consult  8/21 ortho consult. OR for excision and debridement of BLE nec fasc-- feet to thighs. Remained intubated, PCCM consulted   Interim History / Subjective:  POD0 BLE excision debridement of nec fasc  4 PRBC 2 FFP during case   Objective   Blood pressure (!) 158/90, pulse (!) 123, temperature 98.8 F (37.1 C), resp. rate 17, height 5\' 7"  (1.702 m), weight 84.4 kg, SpO2 96%.        Intake/Output Summary (Last 24 hours) at 10/08/2022 1737 Last data filed at 10/08/2022 1715 Gross per 24 hour  Intake 7544.86 ml  Output --  Net 7544.86 ml   Filed Weights   10/03/22 2301 10/08/22 1344  Weight: 84.8 kg 84.4 kg    Examination: General:  critically ill appearing on mech vent HEENT: MM pink/moist; ETT in place Neuro: sedate CV: s1s2,  RRR, no m/r/g PULM:  dim clear BS bilaterally; on mech vent PRVC GI: soft, bsx4 active  Extremities: warm/dry, ble w/ wound vac dressing Skin: no rashes or lesions    Resolved Hospital Problem list     Assessment & Plan:   Bilateral feet abscess, necrotizing fascitis, and cellulitis s/p b/l excisional leg and thigh debridement Severe sepsis 2/2 to above ABLA: required 4 units of PRBCs during surgery Hypotension: post op likely sedation related; also septic Plan: -ID following; cont linezolid, flagyl, cefepime -Ortho following; appreciate recs -trend wbc/fever curve -check cbc, dic panel -transfuse for hgb <7 -f/u tissue culture -IV fluids -will give amp of bicarb and start on bicarb drip -wean levo for map goal >65   Post op vent management Plan: -cxr to confirm ett position -LTVV strategy with tidal volumes of 6-8 cc/kg ideal body weight -check ABG and adjust settings accordingly  -Wean PEEP/FiO2 for SpO2 >92% -VAP bundle in place -Daily SAT and SBT -PAD protocol in place -wean sedation for RASS goal 0 to -1 -Follow intermittent CXR and ABG PRN   AKI on CKD 3a Hyponatremia Plan: -Trend BMP / urinary output -Replace electrolytes as indicated -Avoid nephrotoxic agents, ensure adequate renal perfusion\   Type 1 DM Plan: -ssi and cbg monitoring   Diabetic gastroparesis s/p gastric pacemaker -Recurrent admission for intractable nausea vomiting: Dilaudid seeking behavior. Plan: -cont reglan  HTN HLD Plan: -hold home meds given hypotension  Best Practice (right click and "Reselect all SmartList Selections" daily)   Diet/type: NPO DVT prophylaxis: other; given ABLA post op will hold for now; no scds given wound vac  GI prophylaxis: H2B Lines: Central line and Arterial Line Foley:  N/A Code Status:  full code Last date of multidisciplinary goals of care discussion [pending]  Labs   CBC: Recent Labs  Lab 10/03/22 1354 10/03/22 2029 10/04/22 0409  10/04/22 1200 10/05/22 0835 10/05/22 2200 10/06/22 0308 10/07/22 0351 10/08/22 0442 10/08/22 1255  WBC 31.3*  --  26.9*  --  42.2*  --  55.0* 45.3* 46.9*  --   NEUTROABS 27.8*  --   --   --   --   --   --   --   --   --   HGB 6.4*   < > 6.8*   < > 7.1* 8.7* 8.9* 8.2* 6.8* 7.4*  HCT 20.2*   < > 21.6*   < > 22.2* 27.2* 28.2* 25.8* 21.3* 23.3*  MCV 89.0  --  93.9  --  90.2  --  90.4 90.5 91.4  --   PLT 566*  --  724*  --  558*  --  630* 552* 457*  --    < > = values in this interval not displayed.    Basic Metabolic Panel: Recent Labs  Lab 10/04/22 0409 10/05/22 0835 10/06/22 0308 10/07/22 0351 10/08/22 0442  NA 133* 133* 132* 130* 129*  K 3.8 3.7 4.2 4.1 4.5  CL 106 107 105 103 102  CO2 19* 18* 18* 20* 17*  GLUCOSE 160* 126* 199* 99 125*  BUN 31* 22* 24* 20 23*  CREATININE 1.76* 1.61* 1.59* 1.44* 1.71*  CALCIUM 8.3* 7.7* 8.1* 7.9* 7.9*  MG  --   --   --  1.6* 1.7   GFR: Estimated Creatinine Clearance: 53.2 mL/min (A) (by C-G formula based on SCr of 1.71 mg/dL (H)). Recent Labs  Lab 10/03/22 1354 10/04/22 0409 10/05/22 0835 10/06/22 0308 10/07/22 0351 10/07/22 0822 10/08/22 0442  WBC 31.3*   < > 42.2* 55.0* 45.3*  --  46.9*  LATICACIDVEN 0.9  --   --   --   --  0.8  --    < > = values in this interval not displayed.    Liver Function Tests: Recent Labs  Lab 10/03/22 1354 10/07/22 0351  AST 11* 14*  ALT 8 12  ALKPHOS 108 110  BILITOT 0.3 0.6  PROT 6.2* 5.5*  ALBUMIN 2.1* <1.5*   Recent Labs  Lab 10/03/22 1354  LIPASE 11   No results for input(s): "AMMONIA" in the last 168 hours.  ABG    Component Value Date/Time   PHART 7.4 06/28/2021 1556   PCO2ART 44 06/28/2021 1556   PO2ART 82 (L) 06/28/2021 1556   HCO3 30.5 (H) 04/10/2022 2340   TCO2 20 (L) 11/04/2021 1808   ACIDBASEDEF 0.7 03/04/2022 1110   O2SAT 30.7 04/10/2022 2340     Coagulation Profile: Recent Labs  Lab 10/03/22 1354  INR 1.1    Cardiac Enzymes: No results for input(s):  "CKTOTAL", "CKMB", "CKMBINDEX", "TROPONINI" in the last 168 hours.  HbA1C: Hemoglobin A1C  Date/Time Value Ref Range Status  06/18/2022 09:20 AM 8.8 (A) 4.0 - 5.6 % Final   Hgb A1c MFr Bld  Date/Time Value Ref Range Status  03/23/2022 06:51 AM 11.2 (H) 4.8 - 5.6 % Final    Comment:    (NOTE) Pre diabetes:  5.7%-6.4%  Diabetes:              >6.4%  Glycemic control for   <7.0% adults with diabetes   10/06/2021 04:47 AM 10.3 (H) 4.8 - 5.6 % Final    Comment:    (NOTE) Pre diabetes:          5.7%-6.4%  Diabetes:              >6.4%  Glycemic control for   <7.0% adults with diabetes     CBG: Recent Labs  Lab 10/07/22 1617 10/07/22 2243 10/08/22 0755 10/08/22 1216 10/08/22 1445  GLUCAP 111* 104* 141* 137* 127*    Review of Systems:   Patient is intubated; therefore, history has been obtained from chart review.    Past Medical History:  She,  has a past medical history of Acute H. pylori gastric ulcer, Coffee ground emesis, Diabetes mellitus (HCC), Diabetic gastroparesis (HCC), DKA (diabetic ketoacidosis) (HCC) (02/24/2021), Gastroparesis, GERD (gastroesophageal reflux disease), Hypertension, Hyperthyroidism, Intractable nausea and vomiting (04/20/2021), Normocytic anemia (04/20/2020), and Prolonged Q-T interval on ECG.   Surgical History:   Past Surgical History:  Procedure Laterality Date   AMPUTATION TOE Left 03/10/2018   Procedure: AMPUTATION FIFTH TOE;  Surgeon: Vivi Barrack, DPM;  Location: [facility];  Service: Podiatry;  Laterality: Left;   BIOPSY  01/28/2020   Procedure: BIOPSY;  Surgeon: Kathi Der, MD;  Location: [facility];  Service: Gastroenterology;;   BOTOX INJECTION  08/26/2021   Procedure: BOTOX INJECTION;  Surgeon: Sherrilyn Rist, MD;  Location: [facility];  Service: Gastroenterology;;   ESOPHAGOGASTRODUODENOSCOPY N/A 01/28/2020   Procedure: ESOPHAGOGASTRODUODENOSCOPY (EGD);  Surgeon: Kathi Der,  MD;  Location: [facility];  Service: Gastroenterology;  Laterality: N/A;   ESOPHAGOGASTRODUODENOSCOPY (EGD) WITH PROPOFOL Left 09/08/2015   Procedure: ESOPHAGOGASTRODUODENOSCOPY (EGD) WITH PROPOFOL;  Surgeon: Willis Modena, MD;  Location[facility];  Service: Endoscopy;  Laterality: Left;   ESOPHAGOGASTRODUODENOSCOPY (EGD) WITH PROPOFOL N/A 08/26/2021   Procedure: ESOPHAGOGASTRODUODENOSCOPY (EGD) WITH PROPOFOL;  Surgeon: Sherrilyn Rist, MD;  Location: [facility];  Service: Gastroenterology;  Laterality: N/A;   GASTRIC STIMULATOR IMPLANT SURGERY  06/2022   at [facility]   IRRIGATION AND DEBRIDEMENT FOOT Bilateral 10/05/2022   Procedure: IRRIGATION AND DEBRIDEMENT FOOT;  Surgeon: [name], DPM;  Location: [facility];  Service: Orthopedics/Podiatry;  Laterality: Bilateral;   WISDOM TOOTH EXTRACTION       Social History:   reports that she has never smoked. She has never used smokeless tobacco. She reports current alcohol use of about 1.0 standard drink of alcohol per week. She reports that she does not use drugs.   Family History:  Her family history includes Bipolar disorder in her father; Breast cancer in an other family member; Diabetes in her maternal aunt and mother; Heart disease in an other family member; Kidney disease in an other family member; Liver cancer in her maternal grandfather; Lung cancer in her mother; Ovarian cancer in an other family member; Pancreatic cancer in her maternal uncle; Prostate cancer in her maternal uncle. There is no history of Colon cancer or Stomach cancer.   Allergies Allergies  Allergen Reactions   Ibuprofen Other (See Comments)    07/23/22 - Worsening kidney function after 3 doses of ibuprofen 400mg .  Creatinine increased from 1.3 to 2.  Creatinine returned to normal after stopping.   Lisinopril Cough, Other (See Comments), Itching and Rash    Breakout in rash     Home Medications  Prior to Admission medications   Medication Sig Start Date End  Date Taking? Authorizing Provider  atorvastatin (LIPITOR) 20 MG tablet Take 1 tablet (20 mg total) by mouth daily. 10/29/21  Yes Loyola Mast, MD  carvedilol (COREG) 12.5 MG tablet Take 12.5 mg by mouth 2 (two) times daily. 06/12/22  Yes [provider]  dicyclomine (BENTYL) 20 MG tablet Take 20 mg by mouth as needed for spasms. 08/04/22  Yes [provider]  DULoxetine (CYMBALTA) 30 MG capsule Take 30 mg by mouth daily. 07/24/22  Yes [provider]  etonogestrel (NEXPLANON) 68 MG IMPL implant 1 each by Subdermal route continuous.   Yes [provider]  Ferrous Sulfate (IRON PO) Take 1 tablet by mouth daily.   Yes [provider]  gabapentin (NEURONTIN) 300 MG capsule Take 1 capsule (300 mg total) by mouth 3 (three) times daily. Patient taking differently: Take 600 mg by mouth 3 (three) times daily. 05/09/22 10/04/22 Yes Adhikari, Willia Craze, MD  insulin detemir (LEVEMIR FLEXTOUCH) 100 UNIT/ML FlexPen Inject 16 Units into the skin 2 times daily. Patient taking differently: Inject 8 Units into the skin in the morning and at bedtime. 06/18/22  Yes Shamleffer, Konrad Dolores, MD  Insulin Regular Human (NOVOLIN R FLEXPEN RELION) 100 UNIT/ML KwikPen Max daily 15 units Patient taking differently: Take 1-10 Units by mouth in the morning, at noon, in the evening, and at bedtime. Sliding scale 06/18/22  Yes Shamleffer, Konrad Dolores, MD  losartan (COZAAR) 25 MG tablet Take 1 tablet (25 mg total) by mouth daily. 05/09/22  Yes Burnadette Pop, MD  MAGNESIUM PO Take 1 tablet by mouth daily.   Yes [provider]  metoCLOPramide (REGLAN) 10 MG tablet Take 1 tablet (10 mg) by mouth every 8 hours as needed for nausea. Patient taking differently: Take 10 mg by mouth 4 (four) times daily as needed for nausea. 05/09/22 05/09/23 Yes Burnadette Pop, MD  ondansetron (ZOFRAN) 4 MG tablet Take 1 tablet (4 mg total) by mouth every 8 (eight) hours as needed for nausea or  vomiting. Patient taking differently: Take 4 mg by mouth as needed for nausea or vomiting. 04/21/22  Yes Dorcas Carrow, MD  ondansetron (ZOFRAN-ODT) 8 MG disintegrating tablet Take 8 mg by mouth as needed for nausea or vomiting. 08/04/22  Yes [provider]  OVER THE COUNTER MEDICATION Take 1 tablet by mouth daily. Nervive   Yes [provider]  pantoprazole (PROTONIX) 40 MG tablet Take 1 tablet (40 mg total) by mouth 2 (two) times daily before a meal. 11/08/21  Yes Kathlen Mody, MD  silver sulfADIAZINE (SILVADENE) 1 % cream Apply 1 Application topically daily. Patient taking differently: Apply 1 Application topically in the morning, at noon, in the evening, and at bedtime. 09/25/22  Yes Vivi Barrack, DPM  sucralfate (CARAFATE) 1 g tablet Take 1 g by mouth in the morning, at noon, and at bedtime. 08/28/22  Yes [provider]  Continuous Blood Gluc Transmit (DEXCOM G6 TRANSMITTER) MISC Change every 90 days 11/29/21   Shamleffer, Konrad Dolores, MD  Continuous Glucose Sensor (DEXCOM G6 SENSOR) MISC 1 Device by Does not apply route as directed. 06/18/22   Shamleffer, Konrad Dolores, MD  Continuous Glucose Transmitter (DEXCOM G6 TRANSMITTER) MISC 1 Device by Does not apply route as directed. 06/18/22   Shamleffer, Konrad Dolores, MD  doxycycline (VIBRA-TABS) 100 MG tablet Take 1 tablet (100 mg total) by mouth 2 (two) times daily. Patient not taking: Reported on 10/04/2022 09/25/22  Vivi Barrack, DPM  HYDROcodone-acetaminophen (NORCO/VICODIN) 5-325 MG tablet Take by mouth. Patient not taking: Reported on 10/04/2022 08/28/22   [provider]  Insulin Pen Needle 32G X 4 MM MISC 1 Device by Does not apply route in the morning, at noon, in the evening, and at bedtime. 06/18/22   Shamleffer, Konrad Dolores, MD  TRANSDERM-SCOP 1 MG/3DAYS 1 patch every 3 (three) days. Patient not taking: Reported on 10/04/2022 08/04/22   [provider]  insulin aspart (NOVOLOG)  100 UNIT/ML FlexPen Inject 5 Units into the skin 3 (three) times daily with meals. 02/23/18 07/05/19  Jerald Kief, MD     Critical care time: 45 minutes     JD Daryel [facility] 10/08/2022, 6:02 PM  Please see [email or web address] for pager details.  From 7A-7P if no response, please call [phone]. After hours, please call ELink [phone].

## 2022-10-08 NOTE — Anesthesia Procedure Notes (Signed)
Arterial Line Insertion Start/End8/21/2024 3:49 PM, 10/08/2022 3:53 PM Performed by: Val Eagle, MD, anesthesiologist  Patient location: OR. Preanesthetic checklist: patient identified, IV checked, site marked, risks and benefits discussed, surgical consent, monitors and equipment checked, pre-op evaluation, timeout performed and anesthesia consent Patient sedated Left, radial was placed Catheter size: 20 G Hand hygiene performed   Attempts: 1 Procedure performed without using ultrasound guided technique. Following insertion, dressing applied and Biopatch. Post procedure assessment: normal and unchanged  Patient tolerated the procedure well with no immediate complications.

## 2022-10-08 NOTE — Op Note (Signed)
10/08/2022  5:24 PM  PATIENT:  Kelli Hudson    PRE-OPERATIVE DIAGNOSIS:  Necrotizing fasciitis bilateral thighs, bilateral legs, bilateral feet.  POST-OPERATIVE DIAGNOSIS:  Same  PROCEDURE:  BILATERAL LEG EXCISIONAL DEBRIDEMENT, bilateral thigh excisional debridement, bilateral foot excisional debridement.  With excision of skin and soft tissue muscle and fascia both thighs, both legs, both feet. Tissue cultures sent from both eyes both legs and both feet. Application Kerecis micro graft 95 cm x 2 and 38 cm x 2. Application cleanse choice wound VAC sponges x 4 total. Local tissue rearrangement for wound closure both lower extremities 100 cm in length by 10 cm in width..  Patient received 4 units packed red blood cells, 2 bags of FFP, albumin, pressors, and Maxipen.   SURGEON:  Nadara Mustard, MD  PHYSICIAN ASSISTANT:None ANESTHESIA:   General  PREOPERATIVE INDICATIONS:  [name] is a  32 y.o. female with a diagnosis of Necrotizing fasciitis who failed conservative measures and elected for surgical management.    The risks benefits and alternatives were discussed with the patient preoperatively including but not limited to the risks of infection, bleeding, nerve injury, cardiopulmonary complications, the need for revision surgery, among others, and the patient was willing to proceed.  OPERATIVE IMPLANTS:   Implant Name Type Inv. Item Serial No. Manufacturer Lot No. LRB No. Used Action  GRAFT SKIN WND MICRO 38 - [id number] Tissue GRAFT SKIN WND MICRO 38  [employer] [id number] Right 1 Implanted  GRAFT SKIN WND SURGICLOSE M95 - [id number] Tissue GRAFT SKIN WND Barrett Shell  [employer] [id number] Right 1 Implanted  GRAFT SKIN WND SURGICLOSE M95 - [id number] Tissue GRAFT SKIN WND SURGICLOSE M95  [employer] [id number] Left 1 Implanted  GRAFT SKIN WND MICRO 38 - [id number] Tissue GRAFT SKIN WND MICRO 38  [employer] [id number] Left 1 Implanted     @ENCIMAGES @  OPERATIVE FINDINGS: Patient had fasciitis extending from the foot through the leg and thigh.  Fascia and tissue was sent for stat cultures.  The remaining muscle had weak contractility and consistency and color that was ischemic.  OPERATIVE PROCEDURE: Patient brought the operating room and underwent a general anesthetic   DISCHARGE PLANNING:  Antibiotic duration: Continue antibiotics based on culture sensitivities  Weightbearing: Bed rest  Pain medication: Opioid pathway  Dressing care/ Wound VAC: Continue wound vacs to both lower extremities  Plan for return to the operating room with Dr. Dion Saucier either this weekend or early next week.  I will plan to return  to the operating room Friday next week.  Follow-up: In the office 1 week post operative.

## 2022-10-08 NOTE — Anesthesia Procedure Notes (Signed)
Central Venous Catheter Insertion Performed by: Val Eagle, MD, anesthesiologist Start/End8/21/2024 3:38 PM, 10/08/2022 3:48 PM Patient location: OR. Preanesthetic checklist: patient identified, IV checked, site marked, risks and benefits discussed, surgical consent, monitors and equipment checked, pre-op evaluation, timeout performed and anesthesia consent Position: supine Patient sedated Hand hygiene performed  and maximum sterile barriers used  Catheter size: 8 Fr Total catheter length 16. Central line was placed.Double lumen Procedure performed using ultrasound guided technique. Ultrasound Notes:anatomy identified, needle tip was noted to be adjacent to the nerve/plexus identified, no ultrasound evidence of intravascular and/or intraneural injection and image(s) printed for medical record Attempts: 1 Following insertion, dressing applied, line sutured and Biopatch. Post procedure assessment: blood return through all ports and free fluid flow  Patient tolerated the procedure well with no immediate complications.

## 2022-10-08 NOTE — Progress Notes (Signed)
Subjective: 3 Days Post-Op Procedure(s) (LRB): IRRIGATION AND DEBRIDEMENT FOOT (Bilateral) DOS: 10/05/2022  Patient resting in bed.  Now transferred to ICU.  Continues to have increased pain and tenderness to the bilateral feet with spiking fever and severe leukocytosis.  CT with contrast completed to the bilateral feet.  Objective: Vital signs in last 24 hours: Temp:  [99.3 F (37.4 C)-101.4 F (38.6 C)] 101.2 F (38.4 C) (08/21 0754) Pulse Rate:  [85-132] 121 (08/21 0754) Resp:  [7-23] 23 (08/21 0754) BP: (87-141)/(42-86) 140/58 (08/21 0700) SpO2:  [92 %-100 %] 99 % (08/21 0754)  Recent Labs    10/05/22 0835 10/05/22 2200 10/06/22 0308 10/07/22 0351 10/08/22 0442  HGB 7.1* 8.7* 8.9* 8.2* 6.8*   Recent Labs    10/07/22 0351 10/08/22 0442  WBC 45.3* 46.9*  RBC 2.85* 2.33*  HCT 25.8* 21.3*  PLT 552* 457*   Recent Labs    10/07/22 0351 10/08/22 0442  NA 130* 129*  K 4.1 4.5  CL 103 102  CO2 20* 17*  BUN 20 23*  CREATININE 1.44* 1.71*  GLUCOSE 99 125*  CALCIUM 7.9* 7.9*   No results for input(s): "LABPT", "INR" in the last 72 hours.     Component 3 d ago  Specimen Description WOUND WOUND, RIGHT FOOT Performed at [facility], [address] [place], [place] [address]  Special Requests NONE Performed at [facility], [address], [place], [place] [address]  Gram Stain MODERATE WBC PRESENT,BOTH PMN AND MONONUCLEAR NO ORGANISMS SEEN  Culture NO GROWTH 2 DAYS Performed at [facility], [address] [place], [place] [address]  Report Status PENDING  Resulting Agency [facility]          CT FOOT RIGHT W CONTRAST 10/06/2022 IMPRESSION: 1. Diffuse soft tissue swelling with superficial soft tissue irregularity at the dorsum of the forefoot. There are numerous foci of air within the deep and superficial soft tissues of the forefoot, concerning for infection with gas-forming organism. 2. Possible erosion  along the lateral margin of the first metatarsal head, potentially representing a site of osteomyelitis. MRI could be obtained to further assess, if indicated.  NOTE: CT W CONTRAST PERFORMED POD#2 AFTER INCISION AND DRAINAGE WHICH LIKELY ACCOUNTS FOR "NUMEROUS FOCI OF AIR WITHIN THE DEEP AND SUPERFICIAL SOFT TISSUES OF THE FOREFOOT" AS WELL AS "POSSIBLE EROSION ALONG THE LATERAL MARGIN OF THE FIRST METATARSAL HEAD"  CT FOOT LEFT W CONTRAST 10/06/2022 IMPRESSION: 1. No CT evidence of osteomyelitis of the left foot. 2. 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  Assessment/Plan: 3 Days Post-Op Procedure(s) (LRB): IRRIGATION AND DEBRIDEMENT FOOT (Bilateral) DOS: 10/05/2022  - Today we discussed in detail the findings of the CT with contrast.  I do believe that the air within the deep and superficial soft tissues of the forefoot as well as the irregularity along the lateral margin of the first metatarsal head is secondary to incision and drainage surgery which was performed 10/05/2022.  At that time aggressive incision and drainage was performed to the first intermetatarsal space likely affecting the lateral portion of the first metatarsal head.  Prior to the end of surgery iodoform packing was applied in multiple areas throughout the forefoot.  The packing was removed the day prior to the CT.  -Continue IV broad-spectrum cefepime, Flagyl, Vanc -Cultures pending.  No growth 2 days - Unfortunately, the patient continues to demonstrate  systemic signs of sepsis.  Severe leukocytosis with intermittent spiking fevers and continued malaise and fatigue with increased pain bilateral lower extremities.  After long discussion with the patient I do however feel that is appropriate to return to the OR for a more aggressive incision and drainage of the bilateral feet.  Plan for longer incisions to open the compartments of the foot and  ensure there are no additional abscesses or residual infection within the foot.  This was discussed in detail with the patient and also her mother who was not present but on the phone throughout our conversation.  All are amenable to this plan. -Preoperative orders were placed.  Plan for repeat incision and drainage to the bilateral feet tomorrow, 10/08/2022 after 5 PM -N.p.o. after 9 AM -Will continue to closely follow   [name] 10/08/2022, 7:58 AM  [name], DPM [facility]  Dr. [name], DPM    [address] [place], [place] [address]                Office [phone]  Fax [phone]

## 2022-10-08 NOTE — Progress Notes (Signed)
PROGRESS NOTE Kelli Hudson  YNW:[id number] DOB: 1990/07/31 DOA: 10/03/2022 PCP: Norm Salt, PA  Brief Narrative/Hospital Course: 32 y.o. female with medical history significant of DM1, diabetic gastroparesis with very frequent admission in the past, HTN presented w/ c/o B foot wounds, fever, and pain and informed that she has been dealing with lower extremity wound for past 2 weeks ,followed with her podiatrist in the outpatient setting at [facility] there which she recently finished a course of doxycycline without improving her symptoms.  Started with blisters that ruptured and subsequently redness and streaking up her legs.     In the ED Tmax 102.5,  HR upto 115,BP soft, not hypoxic.  Labs with significant leukocytosis 13.3, lactic acid normal 0.9, hyponatremia 131 creatinine 1.6 bicarb 18 normal LFTs and lipase.  On imaging with chest x-ray, x-rays of right and left foot-erosions noted no osseous changes  Patient admitted for sepsis due to cellulitis/lower extremity wound, severe anemia, AKI. Podiatry consulted Unable to complete MRI as gastric pacemaker is not MRI safe 8/18: Taken to the OR for irrigation and debridement with incision and drainage of bilateral feet. 8/19: Continues to spike fever.  CT scans consistent with more acute infections right plantar aspect and possible first metatarsal osteomyelitis. 8/20: Remains more lethargic, soft blood pressure.  Transferring to stepdown unit. 8/21: Continues to spike fever, more tenderness to the proximal calf muscles, white count 45,000.  Suspected more proximal infection and necrotizing fasciitis.  Transferring to [facility] for emergent orthopedic surgery, debridement.   Subjective:  Seen multiple times.  Blood pressure remains soft.  Response to IV fluid.  Patient is drowsy after pain medication.  She complained of midsternal chest pain, bilateral thigh pain and calf pain.  Remains tachycardic appropriate for fever.   On room air.  Multiple visits.  Mother and aunt at the bedside.  Conference call with infectious disease, orthopedics and podiatry.  Patient probably developing more proximal necrotizing fasciitis and needing emergent debridement.  Transferring to [facility].  Assessment and Plan: Principal Problem:   Sepsis due to cellulitis [facility]) Active Problems:   AKI (acute kidney injury) (HCC)   Cellulitis of lower extremity   Anemia   Diabetic gastroparesis associated with type 1 diabetes mellitus (HCC)   DM type 1 (diabetes mellitus, type 1) (HCC)   Drug-seeking behavior  Bilateral feet abscess and cellulitis  Severe sepsis POA due to above : Very high leukocyte counts.   ABI unremarkable.   Currently remains on broad-spectrum antibiotics including linezolid , Flagyl and cefepime.  Followed by ID. CT scan with contrast shows fluid pockets and persistent infection right foot that will probably need radical debridement.  Podiatry following. Decided to call orthopedics for more proximal exploration, transferring to [facility]. Patient remains on room air.  Tachycardia appropriate to fever.  If she goes for extensive debridement, may need vasopressor support and will go to ICU.  Stable for transfer to the OR.   AKI on CKD 3a with metabolic acidosis Baseline creatinine seems to be around 1.19-1.3 back in March 2024. Likely in the setting of sepsis and wound infection.Holding antihypertensive avoid nephrotoxic medication, avoid hypotension.  Monitor intake output and trend labs, continue IV fluids.  At risk of deterioration.  Urine output is adequate. Recent Labs    05/06/22 0317 05/07/22 0601 05/08/22 0532 05/09/22 0612 10/03/22 1354 10/04/22 0409 10/05/22 0835 10/06/22 0308 10/07/22 0351 10/08/22 0442  BUN 21* 26* 27* 26* 31* 31* 22* 24* 20  23*  CREATININE 1.07* 1.72* 1.30* 1.19* 1.64* 1.76* 1.61* 1.59* 1.44* 1.71*  CO2 20* 21* 20* 19* 18* 19* 18* 18* 20* 17*     Anemia No obvious stigmata of GI bleeding, Hemoccult was negative.  Likely in the setting of chronic disease, infection.  2 units of blood transfusion on 8/18 Hemoglobin 6.8, 1 unit transfusion is started now.   Recent Labs    03/24/22 0553 03/25/22 0543 10/05/22 0835 10/05/22 2200 10/06/22 0308 10/07/22 0351 10/08/22 0442  HGB 10.1*   < > 7.1* 8.7* 8.9* 8.2* 6.8*  MCV 93.9   < > 90.2  --  90.4 90.5 91.4  VITAMINB12 771  --   --   --   --   --   --   FOLATE 12.0  --   --   --   --   --   --   FERRITIN 18  --   --   --   --   --   --   TIBC 417  --   --   --   --   --   --   IRON 78  --   --   --   --   --   --   RETICCTPCT 2.3  --   --   --   --   --   --    < > = values in this interval not displayed.    Type 1 diabetes mellitus w/ hypoglycemia/uncontrolled hyperglycemia: she has brittle diabetes.  Blood sugars were low overnight.  On maintenance IV fluids.  Given sliding scale insulin.  Discontinue long-acting insulin.   Diabetic gastroparesis s/p gastric pacemaker Recurrent admission for intractable nausea vomiting: Dilaudid seeking behavior. Continue scheduled Reglan 10mg  3 times daily,  prn zofraran, last QTc was normal on 8/16 admission. Continue symptomatic management.   N.p.o. today.  No active nausea vomiting.  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 Placed on- PRN IV dilaudid if needed this admission for severe pain on her feet. Decrease dose and frequency of Dilaudid to every 4 hours. Hold gabapentin.  DVT prophylaxis: SCDs Start: 10/05/22 1253 Place and maintain sequential compression device Start: 10/04/22 0825 Code Status:   Code Status: Full Code Family Communication: Patient's mother and aunt at the bedside.  Patient's mother is her healthcare power of attorney. Patient status is: Inpatient because of sepsis/leg wound Level of care:  Progressive   Dispo: The patient is from: home             Anticipated disposition: TBD.  Not clinically stable. Objective: Vitals last 24 hrs: Vitals:   10/08/22 0848 10/08/22 0900 10/08/22 0903 10/08/22 1112  BP: (!) 94/47 (!) 90/45 (!) 87/45 129/66  Pulse: (!) 108 (!) 109 (!) 107 (!) 112  Resp: 12 13 13 15   Temp:   (!) 100.8 F (38.2 C) 97.8 F (36.6 C)  TempSrc:   Axillary Axillary  SpO2: 98% 99% 99% 94%  Weight:      Height:       Weight change:   Physical Examination: General: Sick looking.  Lethargic.  Febrile. Cardiovascular: S1-S2 normal.  Regular rate rhythm.  Tachycardic. Respiratory: Bilateral clear.  No added sounds. Gastrointestinal: Soft and nontender.  Gastric stimulator present.  Nontender. Ext: Both feet wrapped, postop dressing.  Not removed by me. Diffuse tenderness without fluctuation or crepitations on her both legs and thighs. Left  elbow extensor surface with scab, no evidence of fluctuation or crepitation. Neuro: Lethargic but alert awake and oriented.  No focal logical deficits.   Medications reviewed:  Scheduled Meds:  sodium chloride   Intravenous Once   sodium chloride   Intravenous Once   Chlorhexidine Gluconate Cloth  6 each Topical Daily   DULoxetine  30 mg Oral Daily   feeding supplement (GLUCERNA SHAKE)  237 mL Oral BID BM   ferrous sulfate  325 mg Oral Daily   gabapentin  300 mg Oral TID   insulin aspart  0-9 Units Subcutaneous TID WC   metoCLOPramide (REGLAN) injection  10 mg Intravenous Q8H   nutrition supplement (JUVEN)  1 packet Oral BID BM   silver sulfADIAZINE   Topical Daily   sodium chloride flush  10-40 mL Intracatheter Q12H   Continuous Infusions:  ceFEPime (MAXIPIME) IV     lactated ringers 100 mL/hr at 10/08/22 0800   linezolid (ZYVOX) IV Stopped (10/08/22 1108)   metronidazole Stopped (10/08/22 0540)   Pressure Injury 10/07/22 Elbow Posterior;Right Stage 2 -  Partial thickness loss of dermis presenting as a shallow  open injury with a red, pink wound bed without slough. (Active)  10/07/22 1226  Location: Elbow  Location Orientation: Posterior;Right  Staging: Stage 2 -  Partial thickness loss of dermis presenting as a shallow open injury with a red, pink wound bed without slough.  Wound Description (Comments):   Present on Admission:    Diet Order             Diet NPO time specified Except for: Sips with Meds  Diet effective ____                  Intake/Output Summary (Last 24 hours) at 10/08/2022 1217 Last data filed at 10/08/2022 1112 Gross per 24 hour  Intake 7713.92 ml  Output --  Net 7713.92 ml   Net IO Since Admission: 15,056.43 mL [10/08/22 1218]  Wt Readings from Last 3 Encounters:  10/03/22 84.8 kg  06/18/22 83.5 kg  06/16/22 83.9 kg     Unresulted Labs (From admission, onward)     Start     Ordered   10/08/22 0500  Basic metabolic panel  Daily,   R     Question:  Specimen collection method  Answer:  IV Team=IV Team collect   10/07/22 1240   10/07/22 0902  Culture, blood (Routine X 2) w Reflex to ID Panel  BLOOD CULTURE X 2,   R     Question:  Patient immune status  Answer:  Normal   10/07/22 0902   10/05/22 1235  Gastrointestinal Panel by PCR , Stool  (Gastrointestinal Panel by PCR, Stool                                                                                                                                                     **  Does Not include CLOSTRIDIUM DIFFICILE testing. **If CDIFF testing is needed, place order from the "C Difficile Testing" order set.**)  Once,   R        10/05/22 1235   10/05/22 0500  CBC  Daily,   R      10/04/22 0818          Data Reviewed: I have personally reviewed following labs and imaging studies CBC: Recent Labs  Lab 10/03/22 1354 10/03/22 2029 10/04/22 0409 10/04/22 1200 10/05/22 0835 10/05/22 2200 10/06/22 0308 10/07/22 0351 10/08/22 0442  WBC 31.3*  --  26.9*  --  42.2*  --  55.0* 45.3* 46.9*  NEUTROABS 27.8*  --    --   --   --   --   --   --   --   HGB 6.4*   < > 6.8*   < > 7.1* 8.7* 8.9* 8.2* 6.8*  HCT 20.2*   < > 21.6*   < > 22.2* 27.2* 28.2* 25.8* 21.3*  MCV 89.0  --  93.9  --  90.2  --  90.4 90.5 91.4  PLT 566*  --  724*  --  558*  --  630* 552* 457*   < > = values in this interval not displayed.   Basic Metabolic Panel: Recent Labs  Lab 10/04/22 0409 10/05/22 0835 10/06/22 0308 10/07/22 0351 10/08/22 0442  NA 133* 133* 132* 130* 129*  K 3.8 3.7 4.2 4.1 4.5  CL 106 107 105 103 102  CO2 19* 18* 18* 20* 17*  GLUCOSE 160* 126* 199* 99 125*  BUN 31* 22* 24* 20 23*  CREATININE 1.76* 1.61* 1.59* 1.44* 1.71*  CALCIUM 8.3* 7.7* 8.1* 7.9* 7.9*  MG  --   --   --  1.6* 1.7   GFR: Estimated Creatinine Clearance: 53.4 mL/min (A) (by C-G formula based on SCr of 1.71 mg/dL (H)). Liver Function Tests: Recent Labs  Lab 10/03/22 1354 10/07/22 0351  AST 11* 14*  ALT 8 12  ALKPHOS 108 110  BILITOT 0.3 0.6  PROT 6.2* 5.5*  ALBUMIN 2.1* <1.5*   Recent Labs  Lab 10/03/22 1354  LIPASE 11   Recent Results (from the past 240 hour(s))  Culture, blood (Routine x 2)     Status: None   Collection Time: 10/03/22  1:52 PM   Specimen: BLOOD RIGHT ARM  Result Value Ref Range Status   Specimen Description   Final    BLOOD RIGHT ARM Performed at [facility], [address], [place], [place] [address]    Special Requests   Final    BOTTLES DRAWN AEROBIC AND ANAEROBIC Blood Culture adequate volume Performed at [facility], [address] [place], [place] [address]    Culture   Final    NO GROWTH 5 DAYS Performed at [facility], [address], [place], [place] [address]    Report Status 10/08/2022 FINAL  Final  SARS Coronavirus 2 by RT PCR (hospital order, performed in [facility] hospital lab) *cepheid single result test* Anterior Nasal Swab     Status: None   Collection Time: 10/03/22  1:54 PM   Specimen: Anterior Nasal Swab  Result Value Ref Range Status    SARS Coronavirus 2 by RT PCR NEGATIVE NEGATIVE Final    Comment: (NOTE) SARS-CoV-2 target nucleic acids are NOT DETECTED.  The SARS-CoV-2 RNA is generally detectable in upper and lower respiratory specimens during the acute phase of infection. The lowest  concentration of SARS-CoV-2 viral copies this assay can detect is 250 copies / mL. A negative result does not preclude SARS-CoV-2 infection and should not be used as the sole basis for treatment or other patient management decisions.  A negative result may occur with improper specimen collection / handling, submission of specimen other than nasopharyngeal swab, presence of viral mutation(s) within the areas targeted by this assay, and inadequate number of viral copies (<250 copies / mL). A negative result must be combined with clinical observations, patient history, and epidemiological information.  Fact Sheet for Patients:   RoadLapTop.co.za  Fact Sheet for Healthcare Providers: http://kim-miller.com/  This test is not yet approved or  cleared by the [place] FDA and has been authorized for detection and/or diagnosis of SARS-CoV-2 by FDA under an Emergency Use Authorization (EUA).  This EUA will remain in effect (meaning this test can be used) for the duration of the COVID-19 declaration under Section 564(b)(1) of the Act, 21 U.S.C. section 360bbb-3(b)(1), unless the authorization is terminated or revoked sooner.  Performed at [facility], [address] [place], [place] [address]   Aerobic/Anaerobic Culture w Gram Stain (surgical/deep wound)     Status: None (Preliminary result)   Collection Time: 10/05/22 10:48 AM   Specimen: Wound  Result Value Ref Range Status   Specimen Description   Final    WOUND WOUND, RIGHT FOOT Performed at [facility], [address] [place], [place] [address]    Special Requests   Final    NONE Performed  at [facility], [address] [place], [place] [address]    Gram Stain   Final    MODERATE WBC PRESENT,BOTH PMN AND MONONUCLEAR NO ORGANISMS SEEN    Culture   Final    NO GROWTH 2 DAYS NO ANAEROBES ISOLATED; CULTURE IN PROGRESS FOR 5 DAYS Performed at [facility], [address], [place], [place] [address]    Report Status PENDING  Incomplete  MRSA Next Gen by PCR, Nasal     Status: None   Collection Time: 10/07/22 12:20 PM   Specimen: Nasal Mucosa; Nasal Swab  Result Value Ref Range Status   MRSA by PCR Next Gen NOT DETECTED NOT DETECTED Final    Comment: (NOTE) The GeneXpert MRSA Assay (FDA approved for NASAL specimens only), is one component of a comprehensive MRSA colonization surveillance program. It is not intended to diagnose MRSA infection nor to guide or monitor treatment for MRSA infections. Test performance is not FDA approved in patients less than 18 years old. Performed at [facility], [address], [place], [place] [address]   Culture, blood (Routine X 2) w Reflex to ID Panel     Status: None (Preliminary result)   Collection Time: 10/07/22 10:10 PM   Specimen: BLOOD LEFT HAND  Result Value Ref Range Status   Specimen Description BLOOD LEFT HAND  Final   Special Requests   Final    BOTTLES DRAWN AEROBIC ONLY Blood Culture results may not be optimal due to an inadequate volume of blood received in culture bottles Performed at [facility], [address], [place], [place] [address]    Culture PENDING  Incomplete   Report Status PENDING  Incomplete    Antimicrobials: Anti-infectives (From admission, onward)    Start     Dose/Rate Route Frequency Ordered Stop   10/08/22 1600  ceFEPIme (MAXIPIME) 2 g in sodium chloride 0.9 % 100 mL IVPB  2 g 200 mL/hr over 30 Minutes Intravenous Every 12 hours 10/08/22 0736     10/07/22 2200  linezolid (ZYVOX) IVPB 600 mg       Note to Pharmacy: Per Dr Renold Don- will  likely d/c Vanc 8/21 buts to keep both while awaiting blood cultures 8/20   600 mg 300 mL/hr over 60 Minutes Intravenous Every 12 hours 10/07/22 1658     10/07/22 2000  ceFEPIme (MAXIPIME) 2 g in sodium chloride 0.9 % 100 mL IVPB  Status:  Discontinued        2 g 200 mL/hr over 30 Minutes Intravenous Every 8 hours 10/07/22 1233 10/08/22 0736   10/04/22 1600  vancomycin (VANCOREADY) IVPB 1250 mg/250 mL  Status:  Discontinued        1,250 mg 166.7 mL/hr over 90 Minutes Intravenous Every 24 hours 10/03/22 2312 10/08/22 0932   10/04/22 0000  ceFEPIme (MAXIPIME) 2 g in sodium chloride 0.9 % 100 mL IVPB  Status:  Discontinued        2 g 200 mL/hr over 30 Minutes Intravenous Every 12 hours 10/03/22 2312 10/07/22 1233   10/03/22 1530  vancomycin (VANCOCIN) 500 mg in sodium chloride 0.9 % 100 mL IVPB        500 mg 100 mL/hr over 60 Minutes Intravenous  Once 10/03/22 1418 10/03/22 2020   10/03/22 1430  vancomycin (VANCOCIN) IVPB 1000 mg/200 mL premix        1,000 mg 200 mL/hr over 60 Minutes Intravenous  Once 10/03/22 1417 10/03/22 1830   10/03/22 1415  metroNIDAZOLE (FLAGYL) IVPB 500 mg        500 mg 100 mL/hr over 60 Minutes Intravenous Every 12 hours 10/03/22 1413 10/10/22 1659   10/03/22 1415  ceFEPIme (MAXIPIME) 2 g in sodium chloride 0.9 % 100 mL IVPB        2 g 200 mL/hr over 30 Minutes Intravenous  Once 10/03/22 1417 10/03/22 1530      Culture/Microbiology    Component Value Date/Time   SDES BLOOD LEFT HAND 10/07/2022 2210   SPECREQUEST  10/07/2022 2210    BOTTLES DRAWN AEROBIC ONLY Blood Culture results may not be optimal due to an inadequate volume of blood received in culture bottles Performed at [facility], [address] [place], [place] [address]    CULT PENDING 10/07/2022 2210   REPTSTATUS PENDING 10/07/2022 2210    Other culture-see note  Radiology Studies: CT FOOT RIGHT W CONTRAST  Result Date: 10/07/2022 CLINICAL DATA:  Foot pain.  Osteomyelitis suspected,  foot, xray done EXAM: CT OF THE LOWER RIGHT EXTREMITY WITH CONTRAST TECHNIQUE: Multidetector CT imaging of the lower right extremity was performed according to the standard protocol following intravenous contrast administration. RADIATION DOSE REDUCTION: This exam was performed according to the departmental dose-optimization program which includes automated exposure control, adjustment of the mA and/or kV according to patient size and/or use of iterative reconstruction technique. CONTRAST:  80mL OMNIPAQUE IOHEXOL 300 MG/ML  SOLN COMPARISON:  X-ray 10/03/2022 FINDINGS: Bones/Joint/Cartilage No acute fracture. No dislocation. Possible erosion along the lateral margin of the first metatarsal head. No additional sites of acute bone destruction or periosteal elevation of the foot. Ligaments Suboptimally assessed by CT. Muscles and Tendons Denervation changes of the foot musculature.  No tenosynovitis. Soft tissues Diffuse soft tissue swelling. There is superficial soft tissue irregularity at the dorsum of the forefoot. There are numerous foci of air within the deep and superficial soft tissues of the forefoot. No organized  or rim enhancing fluid collections. IMPRESSION: 1. Diffuse soft tissue swelling with superficial soft tissue irregularity at the dorsum of the forefoot. There are numerous foci of air within the deep and superficial soft tissues of the forefoot, concerning for infection with gas-forming organism. 2. Possible erosion along the lateral margin of the first metatarsal head, potentially representing a site of osteomyelitis. MRI could be obtained to further assess, if indicated. Electronically Signed   By: Duanne Guess D.O.   On: 10/07/2022 09:35   CT FOOT LEFT W CONTRAST  Result Date: 10/07/2022 CLINICAL DATA:  Foot pain.  Osteomyelitis suspected, foot, xray done EXAM: CT OF THE LOWER LEFT EXTREMITY WITH CONTRAST TECHNIQUE: Multidetector CT imaging of the lower left extremity was performed according  to the standard protocol following intravenous contrast administration. RADIATION DOSE REDUCTION: This exam was performed according to the departmental dose-optimization program which includes automated exposure control, adjustment of the mA and/or kV according to patient size and/or use of iterative reconstruction technique. CONTRAST:  80mL OMNIPAQUE IOHEXOL 300 MG/ML  SOLN COMPARISON:  X-ray 10/03/2022 FINDINGS: Bones/Joint/Cartilage Postsurgical changes from previous amputation of the fourth toe as well as a transmetatarsal amputation of the fifth ray. No focal site of erosion or periosteal elevation. No fracture or malalignment. Joint spaces are preserved. Ligaments Suboptimally assessed by CT. Muscles and Tendons Denervation changes of the foot musculature. No appreciable tenosynovial fluid collection. Soft tissues No organized or drainable fluid collections.  No soft tissue gas. IMPRESSION: 1. No CT evidence of osteomyelitis of the left foot. 2. 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 Electronically Signed   By: Duanne Guess D.O.   On: 10/07/2022 09:19     LOS: 5 days   Total time spent: 35 minutes  Dorcas Carrow, MD [facility]  10/08/2022, 12:18 PM

## 2022-10-08 NOTE — Anesthesia Preprocedure Evaluation (Addendum)
Anesthesia Evaluation  Patient identified by MRN, date of birth, ID band Patient awake    Reviewed: Allergy & Precautions, NPO status , Patient's Chart, lab work & pertinent test results  Airway Mallampati: I  TM Distance: >3 FB Neck ROM: Full   Comment: Pt has tongue piercing that she says cannot be removed. States has had many surgeries with it in place. Counseled on risks of the piercing staying in place. Pt understands and would like to proceed with piercing in place.  Dental no notable dental hx. (+) Teeth Intact, Dental Advisory Given   Pulmonary neg pulmonary ROS   breath sounds clear to auscultation       Cardiovascular hypertension, Pt. on home beta blockers and Pt. on medications  Rhythm:Regular     Neuro/Psych  PSYCHIATRIC DISORDERS Anxiety Depression    negative neurological ROS     GI/Hepatic Neg liver ROS, PUD,GERD  ,,Diabetic gastroparesis with gastric stimulator implant   Endo/Other  diabetes, Poorly Controlled, Type 1, Insulin Dependent Hyperthyroidism Lab Results      Component                Value               Date                      HGBA1C                   8.8 (A)             06/18/2022             Renal/GU Renal Insufficiency and ARFRenal diseaseLab Results      Component                Value               Date                      NA                       129 (L)             10/08/2022                K                        4.5                 10/08/2022                CO2                      17 (L)              10/08/2022                GLUCOSE                  125 (H)             10/08/2022                BUN                      23 (H)              10/08/2022  CREATININE               1.71 (H)            10/08/2022                CALCIUM                  7.9 (L)             10/08/2022                GFR                      71.90               09/24/2020                EGFR                      66                  02/04/2021                GFRNONAA                 41 (L)              10/08/2022             negative genitourinary   Musculoskeletal negative musculoskeletal ROS (+)    Abdominal   Peds  Hematology  (+) Blood dyscrasia, anemia Lab Results      Component                Value               Date                      WBC                      46.9 (H)            10/08/2022                HGB                      7.4 (L)             10/08/2022                HCT                      23.3 (L)            10/08/2022                MCV                      91.4                10/08/2022                PLT                      457 (H)             10/08/2022              Anesthesia Other Findings 32 y.o. female with medical history significant of DM1, diabetic  gastroparesis with very frequent admission in the past, HTN presented w/ c/o B/l foot wounds, fever, and pain.  Reproductive/Obstetrics                             Anesthesia Physical Anesthesia Plan  ASA: 5 and emergent  Anesthesia Plan: General   Post-op Pain Management:    Induction: Intravenous, Rapid sequence and Cricoid pressure planned  PONV Risk Score and Plan: 3 and Ondansetron and Dexamethasone  Airway Management Planned: Oral ETT  Additional Equipment: Arterial line, CVP and Ultrasound Guidance Line Placement  Intra-op Plan:   Post-operative Plan: Possible Post-op intubation/ventilation  Informed Consent: I have reviewed the patients History and Physical, chart, labs and discussed the procedure including the risks, benefits and alternatives for the proposed anesthesia with the patient or authorized representative who has indicated his/her understanding and acceptance.     Dental advisory given  Plan Discussed with: CRNA  Anesthesia Plan Comments:        Anesthesia Quick Evaluation

## 2022-10-08 NOTE — Transfer of Care (Signed)
Immediate Anesthesia Transfer of Care Note  Patient: Kelli Hudson  Procedure(s) Performed: BILATERAL LEG DEBRIDEMENT (Bilateral: Leg Lower)  Patient Location: ICU  Anesthesia Type:General  Level of Consciousness: sedated and Patient remains intubated per anesthesia plan  Airway & Oxygen Therapy: Patient remains intubated per anesthesia plan and Patient placed on Ventilator (see vital sign flow sheet for setting)  Post-op Assessment: Report given to RN and Post -op Vital signs reviewed and stable  Post vital signs: Reviewed and stable  Last Vitals:  Vitals Value Taken Time  BP    Temp    Pulse 98 10/08/22 1802  Resp 0 10/08/22 1802  SpO2 100 % 10/08/22 1802  Vitals shown include unfiled device data.  Last Pain:  Vitals:   10/08/22 1432  TempSrc:   PainSc: 5       Patients Stated Pain Goal: 3 (10/08/22 1432)  Complications: No notable events documented.

## 2022-10-08 NOTE — Progress Notes (Signed)
Spoke with medical team and Dr. Lajoyce Corners.  Plan for emergent transfer to OR for [facility] management.   Eulas Post, MD

## 2022-10-08 NOTE — Progress Notes (Signed)
Chaplain paged to pt's bedside to pray with Kelli Hudson and her mother, Karen Kitchens, and grandmother Eber Jones, at their request. Pt shared that she is in a lot of pain and is just trying to cope with the pain at the moment. Chaplain utilized reflective listening to identify challenges and strengths to reflect in prayer in the pt's tradition. Chaplain prayed at bedside and family joined in. Chaplain made the family aware that spiritual care remains available 24/7. Please page as needs arise.  Maryanna Shape. Carley Hammed, M.Div. [facility] Chaplain Pager [phone] Office [phone]

## 2022-10-08 NOTE — Consult Note (Signed)
ORTHOPAEDIC CONSULTATION  REQUESTING PHYSICIAN: Dorcas Carrow, MD  Chief Complaint: Sepsis with necrotizing fasciitis both lower extremities.  HPI: Kelli Hudson is a 32 y.o. female who presents with patient is a 32 year old woman who initially presented to podiatry on August 8 with complaints of blisters and burns to both lower extremities.  Patient then presented to the emergency room with worsening symptoms on August 16.  Patient was started on code sepsis and admitted.  Patient underwent irrigation and debridement with podiatry on August 18.  On August 19 patient had persistent spiking fevers she remained lethargic and today had a white cell count of 45,000.  Patient complains of global pain in both lower extremities.  Past Medical History:  Diagnosis Date   Acute H. pylori gastric ulcer    Coffee ground emesis    Diabetes mellitus (HCC)    Diabetic gastroparesis (HCC)    DKA (diabetic ketoacidosis) (HCC) 02/24/2021   Gastroparesis    GERD (gastroesophageal reflux disease)    Hypertension    Hyperthyroidism    Intractable nausea and vomiting 04/20/2021   Normocytic anemia 04/20/2020   Prolonged Q-T interval on ECG    Past Surgical History:  Procedure Laterality Date   AMPUTATION TOE Left 03/10/2018   Procedure: AMPUTATION FIFTH TOE;  Surgeon: Vivi Barrack, DPM;  Location: [facility];  Service: Podiatry;  Laterality: Left;   BIOPSY  01/28/2020   Procedure: BIOPSY;  Surgeon: Kathi Der, MD;  Location: [facility];  Service: Gastroenterology;;   BOTOX INJECTION  08/26/2021   Procedure: BOTOX INJECTION;  Surgeon: Sherrilyn Rist, MD;  Location: [facility];  Service: Gastroenterology;;   ESOPHAGOGASTRODUODENOSCOPY N/A 01/28/2020   Procedure: ESOPHAGOGASTRODUODENOSCOPY (EGD);  Surgeon: Kathi Der, MD;  Location: [facility];  Service: Gastroenterology;  Laterality: N/A;   ESOPHAGOGASTRODUODENOSCOPY (EGD) WITH PROPOFOL Left  09/08/2015   Procedure: ESOPHAGOGASTRODUODENOSCOPY (EGD) WITH PROPOFOL;  Surgeon: Willis Modena, MD;  Location: [facility];  Service: Endoscopy;  Laterality: Left;   ESOPHAGOGASTRODUODENOSCOPY (EGD) WITH PROPOFOL N/A 08/26/2021   Procedure: ESOPHAGOGASTRODUODENOSCOPY (EGD) WITH PROPOFOL;  Surgeon: Sherrilyn Rist, MD;  Location: [facility];  Service: Gastroenterology;  Laterality: N/A;   GASTRIC STIMULATOR IMPLANT SURGERY  06/2022   at [facility]   IRRIGATION AND DEBRIDEMENT FOOT Bilateral 10/05/2022   Procedure: IRRIGATION AND DEBRIDEMENT FOOT;  Surgeon: [name], DPM;  Location: [facility];  Service: Orthopedics/Podiatry;  Laterality: Bilateral;   WISDOM TOOTH EXTRACTION     Social History   Socioeconomic History   Marital status: Single    Spouse name: Not on file   Number of children: 0   Years of education: Not on file   Highest education level: Not on file  Occupational History   Occupation: Financial risk analyst  Tobacco Use   Smoking status: Never   Smokeless tobacco: Never  Vaping Use   Vaping status: Never Used  Substance and Sexual Activity   Alcohol use: Yes    Alcohol/week: 1.0 standard drink of alcohol    Types: 1 Shots of liquor per week    Comment: occasional    Drug use: No   Sexual activity: Yes    Birth control/protection: Condom, Implant    Comment: Nexplanon  Other Topics Concern   Not on file  Social History Narrative   Not on file   Social Determinants of Health   Financial Resource Strain: Low Risk  (02/13/2022)   Received from [facility]   Overall Financial Resource Strain (CARDIA)  Difficulty of Paying Living Expenses: Not hard at all  Food Insecurity: No Food Insecurity (10/03/2022)   Hunger Vital Sign    Worried About Running Out of Food in the Last Year: Never true    Ran Out of Food in the Last Year: Never true  Transportation Needs: No Transportation Needs (10/03/2022)   PRAPARE - Administrator, Civil Service  (Medical): No    Lack of Transportation (Non-Medical): No  Physical Activity: Not on file  Stress: No Stress Concern Present (09/16/2022)   Received from [facility] Occupational [facility] - Occupational Stress Questionnaire    Feeling of Stress : Not at all  Social Connections: Unknown (06/17/2021)   Received from [facility], [facility]   Social Network    Social Network: Not on file   Family History  Problem Relation Age of Onset   Lung cancer Mother    Diabetes Mother    Bipolar disorder Father    Liver cancer Maternal Grandfather    Diabetes Maternal Aunt        x 2   Pancreatic cancer Maternal Uncle    Prostate cancer Maternal Uncle    Breast cancer Other        maternal great aunt   Heart disease Other    Ovarian cancer Other        maternal great aunt   Kidney disease Other        maternal great aunt   Colon cancer Neg Hx    Stomach cancer Neg Hx    - negative except otherwise stated in the family history section Allergies  Allergen Reactions   Ibuprofen Other (See Comments)    07/23/22 - Worsening kidney function after 3 doses of ibuprofen 400mg .  Creatinine increased from 1.3 to 2.  Creatinine returned to normal after stopping.   Lisinopril Cough, Other (See Comments), Itching and Rash    Breakout in rash   Prior to Admission medications   Medication Sig Start Date End Date Taking? Authorizing Provider  atorvastatin (LIPITOR) 20 MG tablet Take 1 tablet (20 mg total) by mouth daily. 10/29/21  Yes Loyola Mast, MD  carvedilol (COREG) 12.5 MG tablet Take 12.5 mg by mouth 2 (two) times daily. 06/12/22  Yes [provider]  dicyclomine (BENTYL) 20 MG tablet Take 20 mg by mouth as needed for spasms. 08/04/22  Yes [provider]  DULoxetine (CYMBALTA) 30 MG capsule Take 30 mg by mouth daily. 07/24/22  Yes [provider]  etonogestrel (NEXPLANON) 68 MG IMPL implant 1 each by Subdermal route continuous.   Yes [provider]  Ferrous Sulfate (IRON PO) Take 1 tablet by mouth daily.   Yes [provider]  gabapentin (NEURONTIN) 300 MG capsule Take 1 capsule (300 mg total) by mouth 3 (three) times daily. Patient taking differently: Take 600 mg by mouth 3 (three) times daily. 05/09/22 10/04/22 Yes Adhikari, Willia Craze, MD  insulin detemir (LEVEMIR FLEXTOUCH) 100 UNIT/ML FlexPen Inject 16 Units into the skin 2 times daily. Patient taking differently: Inject 8 Units into the skin in the morning and at bedtime. 06/18/22  Yes Shamleffer, Konrad Dolores, MD  Insulin Regular Human (NOVOLIN R FLEXPEN RELION) 100 UNIT/ML KwikPen Max daily 15 units Patient taking differently: Take 1-10 Units by mouth in the morning, at noon, in the evening, and at bedtime. Sliding scale 06/18/22  Yes Shamleffer, Konrad Dolores, MD  losartan (COZAAR) 25 MG tablet Take 1 tablet (25  mg total) by mouth daily. 05/09/22  Yes Burnadette Pop, MD  MAGNESIUM PO Take 1 tablet by mouth daily.   Yes [provider]  metoCLOPramide (REGLAN) 10 MG tablet Take 1 tablet (10 mg) by mouth every 8 hours as needed for nausea. Patient taking differently: Take 10 mg by mouth 4 (four) times daily as needed for nausea. 05/09/22 05/09/23 Yes Burnadette Pop, MD  ondansetron (ZOFRAN) 4 MG tablet Take 1 tablet (4 mg total) by mouth every 8 (eight) hours as needed for nausea or vomiting. Patient taking differently: Take 4 mg by mouth as needed for nausea or vomiting. 04/21/22  Yes Dorcas Carrow, MD  ondansetron (ZOFRAN-ODT) 8 MG disintegrating tablet Take 8 mg by mouth as needed for nausea or vomiting. 08/04/22  Yes [provider]  OVER THE COUNTER MEDICATION Take 1 tablet by mouth daily. Nervive   Yes [provider]  pantoprazole (PROTONIX) 40 MG tablet Take 1 tablet (40 mg total) by mouth 2 (two) times daily before a meal. 11/08/21  Yes Kathlen Mody, MD  silver sulfADIAZINE (SILVADENE) 1 % cream Apply 1 Application topically  daily. Patient taking differently: Apply 1 Application topically in the morning, at noon, in the evening, and at bedtime. 09/25/22  Yes Vivi Barrack, DPM  sucralfate (CARAFATE) 1 g tablet Take 1 g by mouth in the morning, at noon, and at bedtime. 08/28/22  Yes [provider]  Continuous Blood Gluc Transmit (DEXCOM G6 TRANSMITTER) MISC Change every 90 days 11/29/21   Shamleffer, Konrad Dolores, MD  Continuous Glucose Sensor (DEXCOM G6 SENSOR) MISC 1 Device by Does not apply route as directed. 06/18/22   Shamleffer, Konrad Dolores, MD  Continuous Glucose Transmitter (DEXCOM G6 TRANSMITTER) MISC 1 Device by Does not apply route as directed. 06/18/22   Shamleffer, Konrad Dolores, MD  doxycycline (VIBRA-TABS) 100 MG tablet Take 1 tablet (100 mg total) by mouth 2 (two) times daily. Patient not taking: Reported on 10/04/2022 09/25/22   Vivi Barrack, DPM  HYDROcodone-acetaminophen (NORCO/VICODIN) 5-325 MG tablet Take by mouth. Patient not taking: Reported on 10/04/2022 08/28/22   [provider]  Insulin Pen Needle 32G X 4 MM MISC 1 Device by Does not apply route in the morning, at noon, in the evening, and at bedtime. 06/18/22   Shamleffer, Konrad Dolores, MD  TRANSDERM-SCOP 1 MG/3DAYS 1 patch every 3 (three) days. Patient not taking: Reported on 10/04/2022 08/04/22   [provider]  insulin aspart (NOVOLOG) 100 UNIT/ML FlexPen Inject 5 Units into the skin 3 (three) times daily with meals. 02/23/18 07/05/19  Jerald Kief, MD   CT FOOT RIGHT W CONTRAST  Result Date: 10/07/2022 CLINICAL DATA:  Foot pain.  Osteomyelitis suspected, foot, xray done EXAM: CT OF THE LOWER RIGHT EXTREMITY WITH CONTRAST TECHNIQUE: Multidetector CT imaging of the lower right extremity was performed according to the standard protocol following intravenous contrast administration. RADIATION DOSE REDUCTION: This exam was performed according to the departmental dose-optimization program which includes  automated exposure control, adjustment of the mA and/or kV according to patient size and/or use of iterative reconstruction technique. CONTRAST:  80mL OMNIPAQUE IOHEXOL 300 MG/ML  SOLN COMPARISON:  X-ray 10/03/2022 FINDINGS: Bones/Joint/Cartilage No acute fracture. No dislocation. Possible erosion along the lateral margin of the first metatarsal head. No additional sites of acute bone destruction or periosteal elevation of the foot. Ligaments Suboptimally assessed by CT. Muscles and Tendons Denervation changes of the foot musculature.  No tenosynovitis. Soft tissues Diffuse soft tissue swelling. There  is superficial soft tissue irregularity at the dorsum of the forefoot. There are numerous foci of air within the deep and superficial soft tissues of the forefoot. No organized or rim enhancing fluid collections. IMPRESSION: 1. Diffuse soft tissue swelling with superficial soft tissue irregularity at the dorsum of the forefoot. There are numerous foci of air within the deep and superficial soft tissues of the forefoot, concerning for infection with gas-forming organism. 2. Possible erosion along the lateral margin of the first metatarsal head, potentially representing a site of osteomyelitis. MRI could be obtained to further assess, if indicated. Electronically Signed   By: Duanne Guess D.O.   On: 10/07/2022 09:35   CT FOOT LEFT W CONTRAST  Result Date: 10/07/2022 CLINICAL DATA:  Foot pain.  Osteomyelitis suspected, foot, xray done EXAM: CT OF THE LOWER LEFT EXTREMITY WITH CONTRAST TECHNIQUE: Multidetector CT imaging of the lower left extremity was performed according to the standard protocol following intravenous contrast administration. RADIATION DOSE REDUCTION: This exam was performed according to the departmental dose-optimization program which includes automated exposure control, adjustment of the mA and/or kV according to patient size and/or use of iterative reconstruction technique. CONTRAST:  80mL  OMNIPAQUE IOHEXOL 300 MG/ML  SOLN COMPARISON:  X-ray 10/03/2022 FINDINGS: Bones/Joint/Cartilage Postsurgical changes from previous amputation of the fourth toe as well as a transmetatarsal amputation of the fifth ray. No focal site of erosion or periosteal elevation. No fracture or malalignment. Joint spaces are preserved. Ligaments Suboptimally assessed by CT. Muscles and Tendons Denervation changes of the foot musculature. No appreciable tenosynovial fluid collection. Soft tissues No organized or drainable fluid collections.  No soft tissue gas. IMPRESSION: 1. No CT evidence of osteomyelitis of the left foot. 2. 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 Electronically Signed   By: Duanne Guess D.O.   On: 10/07/2022 09:19   - pertinent xrays, CT, MRI studies were reviewed and independently interpreted  Positive ROS: All other systems have been reviewed and were otherwise negative with the exception of those mentioned in the HPI and as above.  Physical Exam: General: Alert, no acute distress Psychiatric: Patient is competent for consent with normal mood and affect Lymphatic: No axillary or cervical lymphadenopathy Cardiovascular: No pedal edema Respiratory: No cyanosis, no use of accessory musculature GI: No organomegaly, abdomen is soft and non-tender    Images:  @ENCIMAGES @  Labs:  Lab Results  Component Value Date   HGBA1C 8.8 (A) 06/18/2022   HGBA1C 11.2 (H) 03/23/2022   HGBA1C 10.3 (H) 10/06/2021   ESRSEDRATE 128 (H) 10/04/2022   ESRSEDRATE 58 (H) 04/18/2021   ESRSEDRATE 80 (H) 04/17/2021   CRP 16.4 (H) 10/04/2022   CRP 1.5 04/18/2021   CRP <1.0 04/17/2021   REPTSTATUS PENDING 10/07/2022   GRAMSTAIN  10/05/2022    MODERATE WBC PRESENT,BOTH PMN AND MONONUCLEAR NO ORGANISMS SEEN    CULT PENDING 10/07/2022    Lab Results  Component Value Date   ALBUMIN <1.5 (L) 10/07/2022   ALBUMIN 2.1 (L)  10/03/2022   ALBUMIN 3.8 05/05/2022   PREALBUMIN <5 (L) 10/04/2022        Latest Ref Rng & Units 10/08/2022   12:55 PM 10/08/2022    4:42 AM 10/07/2022    3:51 AM  CBC EXTENDED  WBC 4.0 - 10.5 K/uL  46.9  45.3   RBC 3.87 - 5.11 MIL/uL  2.33  2.85   Hemoglobin 12.0 - 15.0 g/dL 7.4  6.8  8.2   HCT 36.0 - 46.0 % 23.3  21.3  25.8   Platelets 150 - 400 K/uL  457  552     Neurologic: Patient does not have protective sensation bilateral lower extremities.   MUSCULOSKELETAL:   Skin: Examination patient has crepitus and induration of both calfs medially and this extends to both thighs medially.  She is extremely tender to palpation from the calf up to the medial thigh including the groin.  There is induration of the skin there is crepitation with palpation.  White cell count 46.9 most recent neutrophil to lymphocyte count is 27.8/1.4.  Hemoglobin 7.4 with an albumin less than 1.5.    Assessment: Assessment: Necrotizing fasciitis involving both calves and both thighs.  Plan: Plan: Will plan for emergent surgical intervention for open debridement of both calfs and thigh placement of cleanse choice wound VAC sponges placement of Kerecis micro graft.  I will have my associate Dr. Dion Saucier follow-up this weekend for evaluation for repeat debridement this weekend or early next week.  I will plan on repeat surgery Friday next week.  I have discussed with the patient and her mother twice that this is a life-threatening infection and may also be a limb threatening infection as well.  Plan for ICU after surgery patient will need blood preoperatively.  Thank you for the consult and the opportunity to see Ms. Ortencia Kick, MD [facility] [phone] 2:14 PM

## 2022-10-08 NOTE — Progress Notes (Signed)
Per the mother Kelli Hudson [phone]  , please call her with any and all changes to the pt's medical condition. She would also like to be notified of all medical decisions as the pt cannot relay all information effectively.

## 2022-10-08 NOTE — Progress Notes (Signed)
Hospital Chaplain at the bedside to pray with pt and family before the surgical procedure, as requested by the family.

## 2022-10-08 NOTE — Progress Notes (Signed)
[facility]  Date of Admission:  10/03/2022       Abx: 8/20-c linezolid 8/17-c cefepime 8/17-c metronidazole   8/17-21 vanc                                                                 Assessment: 32 y.o. female dm1, peripheral neuropathy, admitted with sepsis and diabetic foot infection/om    Patient with aki/severe left sided soft tissue infection with gaseous changes in the ct scan Wbc rise maxed at 55 on 8/19   S/p initial bilateral feet I&D 8/18 -- cx ngtd   8/16 bcx ngtd 8/20 bcx pending     Wbc today had decreased to 45 but appears still septic with sinus tach    -------------- 10/08/22 id assessment Ongoing fever and leukemoid degree leukocytosis with increasing bilateral legs pain today Question of nec fasc  Also new onset chest pain which primary team is working up  [employer] worsening. Some data suggesting linezolid primary instead of adjunctive would do as well for nec fasc; and want to avoid vanc so will stop vanc today  Bcx and 8/18 I&D cx ngtd     Plan: Change abx to linezolid, cefepime, flagyl Discussed with podiatry/primary team --> asked ortho (dr Dion Saucier) to look into case given severe sepsis no improvement after initial I&D and concern for nec fasc Chest pain w/u per primary team   I spent more than 50 minute reviewing data/chart, and coordinating care, providing direct face to face time providing counseling/discussing diagnostics/treatment plan with patient and treatment team   Principal Problem:   Sepsis due to cellulitis [facility]) Active Problems:   Anemia   AKI (acute kidney injury) (HCC)   Diabetic gastroparesis associated with type 1 diabetes mellitus (HCC)   DM type 1 (diabetes mellitus, type 1) (HCC)   Drug-seeking behavior   Cellulitis of lower extremity   Allergies  Allergen Reactions   Ibuprofen Other (See Comments)    07/23/22 - Worsening kidney function after 3 doses of ibuprofen 400mg .   Creatinine increased from 1.3 to 2.  Creatinine returned to normal after stopping.   Lisinopril Cough, Other (See Comments), Itching and Rash    Breakout in rash    Scheduled Meds:  sodium chloride   Intravenous Once   sodium chloride   Intravenous Once   Chlorhexidine Gluconate Cloth  6 each Topical Daily   DULoxetine  30 mg Oral Daily   feeding supplement (GLUCERNA SHAKE)  237 mL Oral BID BM   ferrous sulfate  325 mg Oral Daily   gabapentin  300 mg Oral TID   insulin aspart  0-9 Units Subcutaneous TID WC   metoCLOPramide (REGLAN) injection  10 mg Intravenous Q8H   nutrition supplement (JUVEN)  1 packet Oral BID BM   silver sulfADIAZINE   Topical Daily   sodium chloride flush  10-40 mL Intracatheter Q12H   Continuous Infusions:  ceFEPime (MAXIPIME) IV     lactated ringers 100 mL/hr at 10/08/22 0800   linezolid (ZYVOX) IV 600 mg (10/08/22 1008)   metronidazole Stopped (10/08/22 0540)   PRN Meds:.acetaminophen **OR** acetaminophen, dicyclomine, HYDROmorphone (DILAUDID) injection, ondansetron (ZOFRAN) IV, mouth rinse, sodium chloride flush, sodium chloride flush   SUBJECTIVE:  Next chest pain (sharp/pressure) pleuritic Also bilateral legs pain  Ongoing fever   Review of Systems: ROS All other ROS was negative, except mentioned above     OBJECTIVE: Vitals:   10/08/22 0848 10/08/22 0900 10/08/22 0903 10/08/22 1112  BP: (!) 94/47 (!) 90/45 (!) 87/45 129/66  Pulse: (!) 108 (!) 109 (!) 107 (!) 112  Resp: 12 13 13 15   Temp:   (!) 100.8 F (38.2 C) 97.8 F (36.6 C)  TempSrc:   Axillary Axillary  SpO2: 98% 99% 99% 94%  Weight:      Height:       Body mass index is 29.28 kg/m.  Physical Exam General/constitutional: moderate distress complaining of chest pain HEENT: Normocephalic, PER, Conj Clear, EOMI, Oropharynx clear Neck supple CV: rrr no mrg Lungs: clear to auscultation, normal respiratory effort Abd: Soft, Nontender Ext: no edema Skin/msk: bilateral feet  dressing clean/dry; scattered eschar small on bilateral distal LE; no tenderness/crepitus Neuro: nonfocal    Lab Results Lab Results  Component Value Date   WBC 46.9 (H) 10/08/2022   HGB 6.8 (LL) 10/08/2022   HCT 21.3 (L) 10/08/2022   MCV 91.4 10/08/2022   PLT 457 (H) 10/08/2022    Lab Results  Component Value Date   CREATININE 1.71 (H) 10/08/2022   BUN 23 (H) 10/08/2022   NA 129 (L) 10/08/2022   K 4.5 10/08/2022   CL 102 10/08/2022   CO2 17 (L) 10/08/2022    Lab Results  Component Value Date   ALT 12 10/07/2022   AST 14 (L) 10/07/2022   ALKPHOS 110 10/07/2022   BILITOT 0.6 10/07/2022      Microbiology: Recent Results (from the past 240 hour(s))  Culture, blood (Routine x 2)     Status: None   Collection Time: 10/03/22  1:52 PM   Specimen: BLOOD RIGHT ARM  Result Value Ref Range Status   Specimen Description   Final    BLOOD RIGHT ARM Performed at [facility], [address], [place], [place] [address]    Special Requests   Final    BOTTLES DRAWN AEROBIC AND ANAEROBIC Blood Culture adequate volume Performed at [facility], [address] [place], [place] [address]    Culture   Final    NO GROWTH 5 DAYS Performed at [facility], [address], [place], [place] [address]    Report Status 10/08/2022 FINAL  Final  SARS Coronavirus 2 by RT PCR (hospital order, performed in [facility] hospital lab) *cepheid single result test* Anterior Nasal Swab     Status: None   Collection Time: 10/03/22  1:54 PM   Specimen: Anterior Nasal Swab  Result Value Ref Range Status   SARS Coronavirus 2 by RT PCR NEGATIVE NEGATIVE Final    Comment: (NOTE) SARS-CoV-2 target nucleic acids are NOT DETECTED.  The SARS-CoV-2 RNA is generally detectable in upper and lower respiratory specimens during the acute phase of infection. The lowest concentration of SARS-CoV-2 viral copies this assay can detect is 250 copies / mL. A negative result does not  preclude SARS-CoV-2 infection and should not be used as the sole basis for treatment or other patient management decisions.  A negative result may occur with improper specimen collection / handling, submission of specimen other than nasopharyngeal swab, presence of viral mutation(s) within the areas targeted by this assay, and inadequate number of viral copies (<250 copies / mL). A negative result must be combined with clinical observations, patient history,  and epidemiological information.  Fact Sheet for Patients:   RoadLapTop.co.za  Fact Sheet for Healthcare Providers: http://kim-miller.com/  This test is not yet approved or  cleared by the [place] FDA and has been authorized for detection and/or diagnosis of SARS-CoV-2 by FDA under an Emergency Use Authorization (EUA).  This EUA will remain in effect (meaning this test can be used) for the duration of the COVID-19 declaration under Section 564(b)(1) of the Act, 21 U.S.C. section 360bbb-3(b)(1), unless the authorization is terminated or revoked sooner.  Performed at [facility], [address], [place], [place] [address]   Aerobic/Anaerobic Culture w Gram Stain (surgical/deep wound)     Status: None (Preliminary result)   Collection Time: 10/05/22 10:48 AM   Specimen: Wound  Result Value Ref Range Status   Specimen Description   Final    WOUND WOUND, RIGHT FOOT Performed at [facility], [address], [place], [place] [address]    Special Requests   Final    NONE Performed at [facility], [address], [place], [place] [address]    Gram Stain   Final    MODERATE WBC PRESENT,BOTH PMN AND MONONUCLEAR NO ORGANISMS SEEN    Culture   Final    NO GROWTH 2 DAYS NO ANAEROBES ISOLATED; CULTURE IN PROGRESS FOR 5 DAYS Performed at [facility], [address], [place], [place] [address]    Report Status PENDING   Incomplete  MRSA Next Gen by PCR, Nasal     Status: None   Collection Time: 10/07/22 12:20 PM   Specimen: Nasal Mucosa; Nasal Swab  Result Value Ref Range Status   MRSA by PCR Next Gen NOT DETECTED NOT DETECTED Final    Comment: (NOTE) The GeneXpert MRSA Assay (FDA approved for NASAL specimens only), is one component of a comprehensive MRSA colonization surveillance program. It is not intended to diagnose MRSA infection nor to guide or monitor treatment for MRSA infections. Test performance is not FDA approved in patients less than 25 years old. Performed at [facility], [address] [place], [place] [address]   Culture, blood (Routine X 2) w Reflex to ID Panel     Status: None (Preliminary result)   Collection Time: 10/07/22 10:10 PM   Specimen: BLOOD LEFT HAND  Result Value Ref Range Status   Specimen Description BLOOD LEFT HAND  Final   Special Requests   Final    BOTTLES DRAWN AEROBIC ONLY Blood Culture results may not be optimal due to an inadequate volume of blood received in culture bottles Performed at [facility], [address] [place], [place] [address]    Culture PENDING  Incomplete   Report Status PENDING  Incomplete     Serology:   Imaging: If present, new imagings (plain films, ct scans, and mri) have been personally visualized and interpreted; radiology reports have been reviewed. Decision making incorporated into the Impression / Recommendations.   Raymondo Band, MD [facility] [phone] pager    10/08/2022, 11:19 AM

## 2022-10-08 NOTE — Plan of Care (Signed)
  Problem: Education: Goal: Knowledge of General Education information will improve Description: Including pain rating scale, medication(s)/side effects and non-pharmacologic comfort measures Outcome: Progressing   Problem: Clinical Measurements: Goal: Ability to maintain clinical measurements within normal limits will improve Outcome: Progressing   Problem: Nutrition: Goal: Adequate nutrition will be maintained Outcome: Progressing   Problem: Coping: Goal: Ability to adjust to condition or change in health will improve Outcome: Progressing   Problem: Clinical Measurements: Goal: Will remain free from infection Outcome: Not Progressing

## 2022-10-08 NOTE — Anesthesia Procedure Notes (Signed)
Procedure Name: Intubation Date/Time: 10/08/2022 3:35 PM  Performed by: Orlin Hilding, CRNAPre-anesthesia Checklist: Patient identified, Emergency Drugs available, Suction available, Patient being monitored and Timeout performed Patient Re-evaluated:Patient Re-evaluated prior to induction Oxygen Delivery Method: Circle system utilized Preoxygenation: Pre-oxygenation with 100% oxygen Induction Type: IV induction Ventilation: Mask ventilation without difficulty Laryngoscope Size: Glidescope and 3 Grade View: Grade I Tube type: Oral Tube size: 7.0 mm Number of attempts: 1 Placement Confirmation: ETT inserted through vocal cords under direct vision, positive ETCO2 and breath sounds checked- equal and bilateral Secured at: 23 cm Tube secured with: Tape Dental Injury: Teeth and Oropharynx as per pre-operative assessment

## 2022-10-09 ENCOUNTER — Encounter (HOSPITAL_COMMUNITY): Payer: Self-pay | Admitting: Orthopedic Surgery

## 2022-10-09 ENCOUNTER — Inpatient Hospital Stay (HOSPITAL_COMMUNITY): Payer: Medicaid Other

## 2022-10-09 DIAGNOSIS — L0390 Cellulitis, unspecified: Secondary | ICD-10-CM | POA: Diagnosis not present

## 2022-10-09 DIAGNOSIS — Z9911 Dependence on respirator [ventilator] status: Secondary | ICD-10-CM

## 2022-10-09 DIAGNOSIS — A419 Sepsis, unspecified organism: Secondary | ICD-10-CM | POA: Diagnosis not present

## 2022-10-09 LAB — TYPE AND SCREEN
ABO/RH(D): O POS
ABO/RH(D): O POS
Antibody Screen: NEGATIVE
Antibody Screen: NEGATIVE
Unit division: 0
Unit division: 0
Unit division: 0
Unit division: 0
Unit division: 0

## 2022-10-09 LAB — BPAM FFP
Blood Product Expiration Date: 202408262359
Blood Product Expiration Date: 202408262359
ISSUE DATE / TIME: 202408211612
ISSUE DATE / TIME: 202408211612
Unit Type and Rh: 6200
Unit Type and Rh: 6200

## 2022-10-09 LAB — BPAM RBC
Blood Product Expiration Date: 202409162359
Blood Product Expiration Date: 202409142359
Blood Product Expiration Date: 202409142359
Blood Product Expiration Date: 202409142359
Blood Product Expiration Date: 202409142359
ISSUE DATE / TIME: 202408210839
ISSUE DATE / TIME: 202408211506
ISSUE DATE / TIME: 202408211506
ISSUE DATE / TIME: 202408211607
ISSUE DATE / TIME: 202408211607
Unit Type and Rh: 5100
Unit Type and Rh: 5100
Unit Type and Rh: 5100
Unit Type and Rh: 5100
Unit Type and Rh: 5100

## 2022-10-09 LAB — BASIC METABOLIC PANEL
Anion gap: 11 (ref 5–15)
Anion gap: 11 (ref 5–15)
Anion gap: 7 (ref 5–15)
BUN: 24 mg/dL — ABNORMAL HIGH (ref 6–20)
BUN: 24 mg/dL — ABNORMAL HIGH (ref 6–20)
BUN: 27 mg/dL — ABNORMAL HIGH (ref 6–20)
CO2: 19 mmol/L — ABNORMAL LOW (ref 22–32)
CO2: 20 mmol/L — ABNORMAL LOW (ref 22–32)
CO2: 22 mmol/L (ref 22–32)
Calcium: 7.7 mg/dL — ABNORMAL LOW (ref 8.9–10.3)
Calcium: 7.8 mg/dL — ABNORMAL LOW (ref 8.9–10.3)
Calcium: 7.1 mg/dL — ABNORMAL LOW (ref 8.9–10.3)
Chloride: 100 mmol/L (ref 98–111)
Chloride: 101 mmol/L (ref 98–111)
Chloride: 102 mmol/L (ref 98–111)
Creatinine, Ser: 1.9 mg/dL — ABNORMAL HIGH (ref 0.44–1.00)
Creatinine, Ser: 1.99 mg/dL — ABNORMAL HIGH (ref 0.44–1.00)
Creatinine, Ser: 2.04 mg/dL — ABNORMAL HIGH (ref 0.44–1.00)
GFR, Estimated: 34 mL/min — ABNORMAL LOW (ref >60)
GFR, Estimated: 36 mL/min — ABNORMAL LOW (ref >60)
GFR, Estimated: 33 mL/min — ABNORMAL LOW (ref >60)
Glucose, Bld: 211 mg/dL — ABNORMAL HIGH (ref 70–99)
Glucose, Bld: 213 mg/dL — ABNORMAL HIGH (ref 70–99)
Glucose, Bld: 141 mg/dL — ABNORMAL HIGH (ref 70–99)
Potassium: 4.3 mmol/L (ref 3.5–5.1)
Potassium: 4.4 mmol/L (ref 3.5–5.1)
Potassium: 3.7 mmol/L (ref 3.5–5.1)
Sodium: 131 mmol/L — ABNORMAL LOW (ref 135–145)
Sodium: 131 mmol/L — ABNORMAL LOW (ref 135–145)
Sodium: 131 mmol/L — ABNORMAL LOW (ref 135–145)

## 2022-10-09 LAB — CBC
HCT: 29.7 % — ABNORMAL LOW (ref 36.0–46.0)
HCT: 29.7 % — ABNORMAL LOW (ref 36.0–46.0)
HCT: 27.7 % — ABNORMAL LOW (ref 36.0–46.0)
Hemoglobin: 10 g/dL — ABNORMAL LOW (ref 12.0–15.0)
Hemoglobin: 10 g/dL — ABNORMAL LOW (ref 12.0–15.0)
Hemoglobin: 9.4 g/dL — ABNORMAL LOW (ref 12.0–15.0)
MCH: 27.8 pg (ref 26.0–34.0)
MCH: 27.9 pg (ref 26.0–34.0)
MCH: 28.6 pg (ref 26.0–34.0)
MCHC: 33.7 g/dL (ref 30.0–36.0)
MCHC: 33.7 g/dL (ref 30.0–36.0)
MCHC: 33.9 g/dL (ref 30.0–36.0)
MCV: 82.5 fL (ref 80.0–100.0)
MCV: 82.7 fL (ref 80.0–100.0)
MCV: 84.2 fL (ref 80.0–100.0)
Platelets: 335 10*3/uL (ref 150–400)
Platelets: 360 10*3/uL (ref 150–400)
Platelets: 339 10*3/uL (ref 150–400)
RBC: 3.6 MIL/uL — ABNORMAL LOW (ref 3.87–5.11)
RBC: 3.59 MIL/uL — ABNORMAL LOW (ref 3.87–5.11)
RBC: 3.29 MIL/uL — ABNORMAL LOW (ref 3.87–5.11)
RDW: 17.1 % — ABNORMAL HIGH (ref 11.5–15.5)
RDW: 17.2 % — ABNORMAL HIGH (ref 11.5–15.5)
RDW: 17.2 % — ABNORMAL HIGH (ref 11.5–15.5)
WBC: 47.3 10*3/uL — ABNORMAL HIGH (ref 4.0–10.5)
WBC: 44.7 10*3/uL — ABNORMAL HIGH (ref 4.0–10.5)
WBC: 34.5 10*3/uL — ABNORMAL HIGH (ref 4.0–10.5)
nRBC: 0 % (ref 0.0–0.2)
nRBC: 0 % (ref 0.0–0.2)
nRBC: 0.1 % (ref 0.0–0.2)

## 2022-10-09 LAB — GLUCOSE, CAPILLARY
Glucose-Capillary: 115 mg/dL — ABNORMAL HIGH (ref 70–99)
Glucose-Capillary: 122 mg/dL — ABNORMAL HIGH (ref 70–99)
Glucose-Capillary: 156 mg/dL — ABNORMAL HIGH (ref 70–99)
Glucose-Capillary: 180 mg/dL — ABNORMAL HIGH (ref 70–99)
Glucose-Capillary: 189 mg/dL — ABNORMAL HIGH (ref 70–99)
Glucose-Capillary: 198 mg/dL — ABNORMAL HIGH (ref 70–99)

## 2022-10-09 LAB — PROTIME-INR
INR: 2 — ABNORMAL HIGH (ref 0.8–1.2)
INR: 1.6 — ABNORMAL HIGH (ref 0.8–1.2)
INR: 1.5 — ABNORMAL HIGH (ref 0.8–1.2)
Prothrombin Time: 22.8 s — ABNORMAL HIGH (ref 11.4–15.2)
Prothrombin Time: 19.5 seconds — ABNORMAL HIGH (ref 11.4–15.2)
Prothrombin Time: 18.2 s — ABNORMAL HIGH (ref 11.4–15.2)

## 2022-10-09 LAB — PREPARE FRESH FROZEN PLASMA
Unit division: 0
Unit division: 0

## 2022-10-09 LAB — PHOSPHORUS
Phosphorus: 4 mg/dL (ref 2.5–4.6)
Phosphorus: 3.2 mg/dL (ref 2.5–4.6)

## 2022-10-09 LAB — MAGNESIUM
Magnesium: 1.9 mg/dL (ref 1.7–2.4)
Magnesium: 1.8 mg/dL (ref 1.7–2.4)

## 2022-10-09 LAB — VITAMIN D 25 HYDROXY (VIT D DEFICIENCY, FRACTURES): Vit D, 25-Hydroxy: 21.49 ng/mL — ABNORMAL LOW (ref 30–100)

## 2022-10-09 LAB — C-REACTIVE PROTEIN: CRP: 17.2 mg/dL — ABNORMAL HIGH (ref <1.0)

## 2022-10-09 LAB — TRIGLYCERIDES: Triglycerides: 84 mg/dL (ref <150)

## 2022-10-09 MED ORDER — ORAL CARE MOUTH RINSE: 15.0000 mL | OROMUCOSAL | Status: DC

## 2022-10-09 MED ORDER — PROSOURCE TF20 ENFIT COMPATIBL EN LIQD
60.0000 mL | Freq: Every day | ENTERAL | Status: DC
  Administered 2022-10-09 – 2022-10-26 (×16): 60 mL
  Filled 2022-10-09 (×16): qty 60

## 2022-10-09 MED ORDER — THIAMINE MONONITRATE 100 MG PO TABS
100.0000 mg | ORAL_TABLET | Freq: Every day | ORAL | Status: DC
  Administered 2022-10-09 – 2022-10-11 (×3): 100 mg
  Filled 2022-10-09 (×3): qty 1

## 2022-10-09 MED ORDER — INSULIN DETEMIR 100 UNIT/ML ~~LOC~~ SOLN
8.0000 [IU] | Freq: Two times a day (BID) | SUBCUTANEOUS | Status: DC
  Administered 2022-10-09 – 2022-10-10 (×3): 8 [IU] via SUBCUTANEOUS
  Filled 2022-10-09 (×4): qty 0.08

## 2022-10-09 MED ORDER — ORAL CARE MOUTH RINSE: 15.0000 mL | OROMUCOSAL | Status: DC | PRN

## 2022-10-09 MED ORDER — JUVEN PO PACK
1.0000 | PACK | Freq: Two times a day (BID) | ORAL | Status: DC
  Administered 2022-10-09 – 2022-10-23 (×24): 1
  Filled 2022-10-09 (×26): qty 1

## 2022-10-09 MED ORDER — FAMOTIDINE 20 MG PO TABS
20.0000 mg | ORAL_TABLET | Freq: Every day | ORAL | Status: DC
  Administered 2022-10-10: 20 mg
  Filled 2022-10-09 (×2): qty 1

## 2022-10-09 MED ORDER — ADULT MULTIVITAMIN W/MINERALS CH
1.0000 | ORAL_TABLET | Freq: Every day | ORAL | Status: DC
  Administered 2022-10-09 – 2022-10-15 (×6): 1
  Filled 2022-10-09 (×6): qty 1

## 2022-10-09 MED ORDER — VITAMIN K1 10 MG/ML IJ SOLN
10.0000 mg | Freq: Once | INTRAVENOUS | Status: AC
  Administered 2022-10-09: 10 mg via INTRAVENOUS
  Filled 2022-10-09: qty 1

## 2022-10-09 MED ORDER — VITAL 1.5 CAL PO LIQD
1000.0000 mL | ORAL | Status: DC
  Administered 2022-10-09 – 2022-10-21 (×8): 1000 mL
  Filled 2022-10-09 (×5): qty 1000

## 2022-10-09 MED ORDER — FOLIC ACID 1 MG PO TABS
1.0000 mg | ORAL_TABLET | Freq: Every day | ORAL | Status: DC
  Administered 2022-10-09 – 2022-10-11 (×3): 1 mg
  Filled 2022-10-09 (×3): qty 1

## 2022-10-09 MED ORDER — DEXTROSE-SODIUM CHLORIDE 5-0.9 % IV SOLN: INTRAVENOUS | Status: DC

## 2022-10-09 MED ORDER — VITAMIN C 500 MG PO TABS
500.0000 mg | ORAL_TABLET | Freq: Two times a day (BID) | ORAL | Status: DC
  Administered 2022-10-09 – 2022-10-12 (×7): 500 mg
  Filled 2022-10-09 (×9): qty 1

## 2022-10-09 MED ORDER — VASHE WOUND IRRIGATION OPTIME
16.0000 [oz_av] | Freq: Once | TOPICAL | Status: AC
  Administered 2022-10-09: 16 [oz_av] via TOPICAL
  Filled 2022-10-09: qty 16

## 2022-10-09 NOTE — Progress Notes (Signed)
Unable to doppler dorsalis pedis or interior tib pulses on pt bilaterally, good popliteal and femoral dopplers. Since 2000 wound vacs have put out a combined total of ~1000cc serosanguinous drainage. Dr. More w/ ortho paged and notified of findings.   MD recommended to continue to monitor bilat lower extremities for signs of hypoxia and to apply a pulse ox to pt's toes to check for arterial flow. Pulse ox applied to 2nd toe on each foot and displayed 02 sat 100% w/ good pleth. Given stable Hgb, recs given to continue to monitor wound vac output. No intervention needed at this time.

## 2022-10-09 NOTE — Anesthesia Postprocedure Evaluation (Addendum)
Anesthesia Post Note  Patient: Network engineer  Procedure(s) Performed: BILATERAL LEG DEBRIDEMENT (Bilateral: Leg Lower)     Patient location during evaluation: ICU Anesthesia Type: General Level of consciousness: sedated Pain management: pain level controlled Vital Signs Assessment: post-procedure vital signs reviewed and stable Respiratory status: patient remains intubated per anesthesia plan and patient on ventilator - see flowsheet for VS Cardiovascular status: stable Postop Assessment: no apparent nausea or vomiting Anesthetic complications: no Comments: Transported to ICU by CRNA   No notable events documented.  Last Vitals:  Vitals:   10/09/22 0810 10/09/22 0922  BP:    Pulse:    Resp:    Temp:    SpO2: 99% 99%    Last Pain:  Vitals:   10/09/22 0036  TempSrc: Bladder  PainSc:                  Mariann Barter

## 2022-10-09 NOTE — Progress Notes (Signed)
NAME:  Kelli Hudson, MRN:  [id number], DOB:  03/05/1990, LOS: 6 ADMISSION DATE:  10/03/2022, CONSULTATION DATE:  10/08/22 REFERRING MD:  Lajoyce Corners, CHIEF COMPLAINT: nec fasc   History of Present Illness:  [name] PMH Dm1 diabetic gastroparesis now s/p gastric pacemaker, HTN admitted 8/17 with leg swelling -- BLE foot wounds, which  were being followed outpt by podiatry. Admitted to [facility] for sepsis in this setting, OR with podiatry 8/18 for I&D BLE foot wounds.   ID was consulted 8/20 for ongoing fever. Concern for nec fasc 8/21 ortho consulted -- transferred to [facility], underwent excision and debridement of BLE --extending feet to thighs. EBL from this case is not recorded but pt did receive 4 PRBC 2 FFP as well as crystalloid + colloid, as well as pressors for hemodynamic support .   Remains intubated after case. PCCM consulted for ICU management.   Pertinent  Medical History  DM1 Diabetic gastroparesis S/p gastric pacemaker  HTN  Significant Hospital Events: Including procedures, antibiotic start and stop dates in addition to other pertinent events   8/17 admit to [facility] sepsis BLE wound infections. Podiatry consult 8/18 I&D with podiatry.  8/20 ID consult  8/21 ortho consult. OR for excision and debridement of BLE nec fasc-- feet to thighs. Remained intubated, PCCM consulted.  4 PRBC 2 FFP during case.   Interim History / Subjective:  This morning she denies complaints. She remains intubated.   Objective   Blood pressure (!) 158/90, pulse 81, temperature (!) 96.6 F (35.9 C), resp. rate (!) 24, height 5\' 7"  (1.702 m), weight 84.4 kg, SpO2 100%.    Vent Mode: PRVC FiO2 (%):  [40 %] 40 % Set Rate:  [18 bmp-24 bmp] 24 bmp Vt Set:  [490 mL] 490 mL PEEP:  [5 cmH20] 5 cmH20 Plateau Pressure:  [16 cmH20-21 cmH20] 16 cmH20   Intake/Output Summary (Last 24 hours) at 10/09/2022 0715 Last data filed at 10/09/2022 0654 Gross per 24 hour  Intake 6454.22 ml  Output 3275 ml  Net 3179.22 ml    Filed Weights   10/03/22 2301 10/08/22 1344  Weight: 84.8 kg 84.4 kg    Examination: General: Critically ill-appearing woman lying in bed no acute distress, intubated, sedated HEENT: [place]/AT, eyes anicteric, endotracheal tube in place Neuro: RASS -3, able to nod yes and no to questions CV: S1-S2, regular rate and rhythm PULM: Breathing comfortably no acute coagulation, hypoventilating on pressure support 8.  CTAB. GI: Soft, minimally distended.  Surgical scars from previous gastric pacemaker. Extremities: Wound vacs bilateral lower extremities, pitting edema in her hands Skin: Warm, dry  Na+  131 Bicarb 20 BUN 24 Cr 1.99 WBC 47.3 H/H 10/29.7 Platelets 335 INR 2  Blood cultures: NGTD Surgical cultures: NGTD  Resolved Hospital Problem list     Assessment & Plan:   Bilateral feet abscess, necrotizing fascitis, and cellulitis s/p b/l excisional leg and thigh debridement Severe sepsis 2/2 to above Hypotension: post op likely sedation related.  Not currently requiring vasopressors. -Appreciate Ortho's surgical management. - Appreciate ID's management-continue Flagyl, cefepime, vancomycin -Follow tissue cultures - Continued aggressive supportive care, specifically nutritional support.  Will discuss with dietitians. -Vasopressors as required to attain MAP greater than 65   Post-op vent management -LTVV - VAP prevention protocol - VAP protocol for sedation - SAT and SBT today.  Hopefully will be able to extubate this morning.   ABLA due to operative blood loss History of chronic anemia since March 2024, likely anemia of chronic  disease Consumptive coagulopathy -Transfuse for hemoglobin less than 7 or hemodynamically significant bleeding -Vitamin K, continue to monitor  AKI on CKD 3a Hyponatremia, hypervolemic NAGMA -Goal euvolemia, not yet stable for diuresis. - Monitor electrolytes and replete as needed -Continue bicarb infusion today; d/c LR orders - Strict  I's/O - Renally dose meds and avoid nephrotoxic medications.   Type 1 DM, uncontrolled hyperglycemia. A1c 8.8 in 06/2022 -Increase Levemir to 8 units daily - Sliding scale insulin as needed - Anticipate she will need tube feed coverage when these are started -Goal blood glucose 140-180 -Continue gabapentin for neuropathic pain   Protein energy malnutrition, history of gastroparesis causing poor p.o. intake, baseline protein status very poor. Diabetic gastroparesis s/p gastric pacemaker -Will need postpyloric access to improve her nutritional status.  If significant issues with intolerance with enteral feeds may need to consider TPN sooner than later - Check zinc and copper levels - Vitamins -Continue Reglan, will need QTc monitoring -anticipate refeeding syndrome, will need frequent electrolyte monitoring  HTN HLD -Hold PTA antihypertensives -Hydralazine IV as needed for uncontrolled hypertension   Patient's mother updated at bedside with Dr. Lajoyce Corners.  Best Practice (right click and "Reselect all SmartList Selections" daily)   Diet/type: tubefeeds DVT prophylaxis: other; given ABLA post op will hold for now; no scds given wound vac  GI prophylaxis: H2B Lines: Central line and Arterial Line Foley:  N/A Code Status:  full code Last date of multidisciplinary goals of care discussion [pending]  Labs   CBC: Recent Labs  Lab 10/03/22 1354 10/03/22 2029 10/06/22 0308 10/07/22 0351 10/08/22 0442 10/08/22 1255 10/08/22 1723 10/08/22 1802 10/08/22 2000 10/08/22 2001 10/09/22 0329  WBC 31.3*   < > 55.0* 45.3* 46.9*  --   --   --  43.1*  --  47.3*  NEUTROABS 27.8*  --   --   --   --   --   --   --   --   --   --   HGB 6.4*   < > 8.9* 8.2* 6.8*   < > 10.9* 10.2* 10.0* 9.5* 10.0*  HCT 20.2*   < > 28.2* 25.8* 21.3*   < > 32.0* 30.0* 29.4* 28.0* 29.7*  MCV 89.0   < > 90.4 90.5 91.4  --   --   --  84.7  --  82.5  PLT 566*   < > 630* 552* 457*  --   --   --  339  347  --  335   < >  = values in this interval not displayed.    Basic Metabolic Panel: Recent Labs  Lab 10/06/22 0308 10/07/22 0351 10/08/22 0442 10/08/22 1718 10/08/22 1723 10/08/22 1802 10/08/22 2000 10/08/22 2001 10/09/22 0329  NA 132* 130* 129*   < > 131* 131* 132* 135 131*  K 4.2 4.1 4.5   < > 4.8 4.9 4.9 4.5 4.4  CL 105 103 102  --  105  --  103  --  100  CO2 18* 20* 17*  --   --   --  18*  --  20*  GLUCOSE 199* 99 125*  --  153*  --  171*  --  213*  BUN 24* 20 23*  --  22*  --  23*  --  24*  CREATININE 1.59* 1.44* 1.71*  --  1.90*  --  1.93*  --  1.99*  CALCIUM 8.1* 7.9* 7.9*  --   --   --  7.9*  --  7.8*  MG  --  1.6* 1.7  --   --   --  1.6*  --   --    < > = values in this interval not displayed.   GFR: Estimated Creatinine Clearance: 45.7 mL/min (A) (by C-G formula based on SCr of 1.99 mg/dL (H)). Recent Labs  Lab 10/03/22 1354 10/04/22 0409 10/07/22 0351 10/07/22 0822 10/08/22 0442 10/08/22 2000 10/09/22 0329  WBC 31.3*   < > 45.3*  --  46.9* 43.1* 47.3*  LATICACIDVEN 0.9  --   --  0.8  --   --   --    < > = values in this interval not displayed.     ABG    Component Value Date/Time   PHART 7.362 10/08/2022 2001   PCO2ART 30.0 (L) 10/08/2022 2001   PO2ART 189 (H) 10/08/2022 2001   HCO3 17.3 (L) 10/08/2022 2001   TCO2 18 (L) 10/08/2022 2001   ACIDBASEDEF 8.0 (H) 10/08/2022 2001   O2SAT 100 10/08/2022 2001      Critical care time:      This patient is critically ill with multiple organ system failure which requires frequent high complexity decision making, assessment, support, evaluation, and titration of therapies. This was completed through the application of advanced monitoring technologies and extensive interpretation of multiple databases. During this encounter critical care time was devoted to patient care services described in this note for 50 minutes.  Steffanie Dunn, DO 10/09/22 8:19 AM [facility]  For contact information, see Amion.  If no response to pager, please call PCCM consult pager. After hours, 7PM- 7AM, please call Elink.

## 2022-10-09 NOTE — Progress Notes (Addendum)
Nutrition Follow-up  DOCUMENTATION CODES:  Not applicable  INTERVENTION:  Initiate tube feeding via OGT. Cortrak to be placed tomorrow: Vital 1.5 at 60 ml/h (1440 ml per day) Prosource TF20 60 ml 1x/d Provides 2240 kcal, 117 gm protein, 1100 ml free water daily Monitor results of micronutrients: Zinc, vitamin C, Vitamin A, copper. Add replacement as needed 1 packet Juven BID, each packet provides 95 calories, 2.5 grams of protein (collagen), and 9.8 grams of carbohydrate (3 grams sugar); also contains 7 grams of L-arginine and L-glutamine, 300 mg vitamin C, 15 mg vitamin E, 1.2 mcg vitamin B-12, 9.5 mg zinc, 200 mg calcium, and 1.5 g  Calcium Beta-hydroxy-Beta-methylbutyrate to support wound healing MVI with minerals daily Vitamin D 1000 international units x 30 days for deficiency   NUTRITION DIAGNOSIS:   Increased nutrient needs related to wound healing as evidenced by estimated needs.  GOAL:   Patient will meet greater than or equal to 90% of their needs  MONITOR:   PO intake, Supplement acceptance  REASON FOR ASSESSMENT:   Consult Enteral/tube feeding initiation and management  ASSESSMENT:   32 y.o. female admits related to leg swelling. PMH includes: acute H. Pylori gastric ulcer, DM, gastroparesis, DKA, GERD, HTN, hyperthyroidism, normocytic anemia. Pt is currently receiving medical management related to sepsis due to cellulitis.  8/18 - OR, I&D of bilateral feet 8/21 - transferred to ICU, OR excisional debridement of bilateral legs (feet to thigh), 4 wound vacs placed in OR   Pt transferred to the ICU overnight from [facility] after emergent OR trip for debridement of bilateral legs due to necrotizing fascitis. Per Ortho, pt underwent extensive debridement and now with 4 wound vacs in place. Planned for repeat OR trip later this week. Of note, pt with extensive GI intolerance PTA related to gastroparesis. 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   Patient is currently intubated  on ventilator support. No family present at bedside at this time to provide a hx. Attempted to wean this AM but had to be placed back on full support. OGT and NGT in place and terminate gastric. Discussed with MD, concerns for refeeding syndrome with starting nutrition. Reports that mom stated pt has significant difficulty with intake at home due to gastroparesis and tends to eat a lot of broth.   Reviewed records from kitchen as per RN flowsheet, pt with good intake prior to repeat OR trip. Overall, pt meals were light/small aside from one larger meal each day but seemed to be tolerated. On exam, no fat or muscle deficits seen but do not some signs of quick weight changes to pt's upper arms.   Orthopedics concerned with pt's nutrition status as albumin and prealbumin low when assessed. Albumin has a half-life of 21 days and is strongly affected by stress response and inflammatory process. CRP elevated at 17.2 this AM. Inflammation likely driving values artificially low.   Micronutrients being assessed by MD. Added additional labs which may alter would healing. Will replace as needed pending results. Juven in place to be given via tube.   As pt has elevated nutrition needs due to would healing coupled with young age, low threshold for starting TPN if unable to tolerate enteral nutrition. Attending recommending starting TPN over the weekend if enteral nutrition is not tolerated. Will start trickle feeds today with slow advancement to monitor for tolerance and refeeding. Tube to be exchanged for a post-pyloric cortrak tomorrow to aid in enteral feeding tolerance.   MV: 11.1 L/min Temp (24hrs), Avg:95.3 F (35.2  C), Min:94.1 F (34.5 C), Max:98.8 F (37.1 C)  Propofol: 10.13 ml/hr (267 kcal/d)   Intake/Output Summary (Last 24 hours) at 10/09/2022 1327 Last data filed at 10/09/2022 1300 Gross per 24 hour  Intake 7034.25 ml  Output 3275 ml  Net 3759.25 ml  Net IO Since Admission: 18,815.68 mL  [10/09/22 1327] - Left leg wound vacs: out x 24 hours - Right leg wound vacs:1131mL out x 24 hours  Average Meal Intake: 8/17-8/20: 70% intake x 4 recorded meals prior to OR  Nutritionally Relevant Medications: Scheduled Meds:  vitamin C  500 mg BID   docusate  100 mg BID   famotidine  20 mg BID   PROSource TF20  60 mL Daily   folic acid  1 mg Daily   insulin aspart  0-9 Units Q4H   insulin detemir  8 Units BID   metoCLOPramide (REGLAN)   10 mg Q8H   multivitamin with minerals  1 tablet Daily   JUVEN  1 packet BID BM   polyethylene glycol  17 g Daily   thiamine  100 mg Daily   zinc sulfate  220 mg Daily   Continuous Infusions:  ceFEPime (MAXIPIME) IV Stopped (10/09/22 0451)   feeding supplement (VITAL 1.5 CAL)     linezolid (ZYVOX) IV Stopped (10/09/22 1033)   metronidazole Stopped (10/09/22 0981)   norepinephrine (LEVOPHED) Adult infusion     propofol (DIPRIVAN) infusion Stopped (10/09/22 0733)   sodium bicarbonate 150 mEq in dextrose 5 % 1,150 mL infusion 10 mL/hr at 10/09/22 1300   vasopressin     PRN Meds: alum & mag hydroxide-simeth, bisacodyl, ondansetron, polyethylene glycol  Labs Reviewed: Na 131 BUN 24, creatinine 1.9 CBG ranges from 180-217 mg/dL over the last 24 hours HgbA1c 8.8% (5/1)  Micronutrient Profile: CRP 17.2 (high, may skew micronutrient results) Vitamin D: 21.49 (low) Vitamin A pending Vitamin C pending Zinc pending Copper pending  NUTRITION - FOCUSED PHYSICAL EXAM: Flowsheet Row Most Recent Value  Orbital Region No depletion  Upper Arm Region No depletion  Thoracic and Lumbar Region No depletion  Buccal Region No depletion  Temple Region No depletion  Clavicle Bone Region No depletion  Clavicle and Acromion Bone Region Mild depletion  Scapular Bone Region No depletion  Dorsal Hand No depletion  Patellar Region Unable to assess  Anterior Thigh Region Unable to assess  Posterior Calf Region Unable to assess  Edema (RD  Assessment) Moderate  Hair Reviewed  Eyes Reviewed  Mouth Reviewed  Skin Reviewed  Nails Reviewed    Diet Order:   Diet Order             Diet NPO time specified  Diet effective now                   EDUCATION NEEDS:  Not appropriate for education at this time  Skin:  Skin Integrity Issues: Stage 2: - right elbow ( 2.5 cm x 3 cm x 0.1 cm) Surgical incisions: - bilateral legs, wound vac x 4 (2 to each leg)   Last BM:  8/18 - type 7  Height:  Ht Readings from Last 1 Encounters:  10/08/22 5\' 7"  (1.702 m)    Weight:  Wt Readings from Last 1 Encounters:  10/08/22 84.4 kg    Ideal Body Weight:  61.4 kg  BMI:  Body mass index is 29.13 kg/m.  Estimated Nutritional Needs:  Kcal:  2200-2500 kcal/d Protein:  115-130g/d Fluid:  >/= 2.3 L  Greig Castilla, RD, LDN Clinical Dietitian RD pager # available in AMION  After hours/weekend pager # available in [facility]

## 2022-10-09 NOTE — Progress Notes (Signed)
Lab called in regards to pending BMP collected @ 0326. Lab tech stated that lab has processed but is not crossing over into EPIC due to technical issues. Lab tech requested lab be re-ordered as an add-on. Stated it should cross over into EPIC within ~20 minutes.

## 2022-10-09 NOTE — Plan of Care (Signed)
  Problem: Clinical Measurements: Goal: Ability to maintain clinical measurements within normal limits will improve Outcome: Progressing Goal: Diagnostic test results will improve Outcome: Progressing Goal: Respiratory complications will improve Outcome: Progressing Goal: Cardiovascular complication will be avoided Outcome: Progressing   Problem: Coping: Goal: Level of anxiety will decrease Outcome: Progressing   Problem: Elimination: Goal: Will not experience complications related to urinary retention Outcome: Progressing   Problem: Safety: Goal: Ability to remain free from injury will improve Outcome: Progressing   Problem: Skin Integrity: Goal: Risk for impaired skin integrity will decrease Outcome: Progressing   Problem: Metabolic: Goal: Ability to maintain appropriate glucose levels will improve Outcome: Progressing

## 2022-10-09 NOTE — H&P (View-Only) (Signed)
 Patient ID: Kelli Hudson, female   DOB: November 10, 1990, 32 y.o.   MRN: [id number] Patient is a 32 year old woman postoperative day 1 extensive debridement of both legs and both thighs for necrotizing fasciitis.  There is clear serosanguineous drainage no active bleeding in the wound VAC canisters.  There is a good suction fit bilaterally.  Patient's calf and thighs were indurated and firm with crepitation preoperatively.  The thigh and legs are soft at this time patient still has elevated white cell count of 47.3 hemoglobin has improved and stable at 10.0.  Albumin less than 1.5 patient will need around-the-clock tube feeds for nutritional support.  Plan for Dr. Dion Saucier to repeat debridement either this weekend or early next week and then I will return to the operating room for repeat debridement a week from tomorrow.  Patient may be extubated from an orthopedic standpoint.  Orders are placed for dressing changes to both feet with Vashe daily.

## 2022-10-09 NOTE — Progress Notes (Signed)
Patient ID: Kelli Hudson, female   DOB: November 10, 1990, 32 y.o.   MRN: [id number] Patient is a 32 year old woman postoperative day 1 extensive debridement of both legs and both thighs for necrotizing fasciitis.  There is clear serosanguineous drainage no active bleeding in the wound VAC canisters.  There is a good suction fit bilaterally.  Patient's calf and thighs were indurated and firm with crepitation preoperatively.  The thigh and legs are soft at this time patient still has elevated white cell count of 47.3 hemoglobin has improved and stable at 10.0.  Albumin less than 1.5 patient will need around-the-clock tube feeds for nutritional support.  Plan for Dr. Dion Saucier to repeat debridement either this weekend or early next week and then I will return to the operating room for repeat debridement a week from tomorrow.  Patient may be extubated from an orthopedic standpoint.  Orders are placed for dressing changes to both feet with Vashe daily.

## 2022-10-10 ENCOUNTER — Inpatient Hospital Stay (HOSPITAL_COMMUNITY): Payer: Medicaid Other

## 2022-10-10 DIAGNOSIS — N179 Acute kidney failure, unspecified: Secondary | ICD-10-CM | POA: Diagnosis not present

## 2022-10-10 DIAGNOSIS — A419 Sepsis, unspecified organism: Secondary | ICD-10-CM | POA: Diagnosis not present

## 2022-10-10 DIAGNOSIS — M726 Necrotizing fasciitis: Secondary | ICD-10-CM | POA: Diagnosis not present

## 2022-10-10 DIAGNOSIS — Z765 Malingerer [conscious simulation]: Secondary | ICD-10-CM | POA: Diagnosis not present

## 2022-10-10 DIAGNOSIS — L0390 Cellulitis, unspecified: Secondary | ICD-10-CM | POA: Diagnosis not present

## 2022-10-10 LAB — CBC
HCT: 29.4 % — ABNORMAL LOW (ref 36.0–46.0)
HCT: 32.2 % — ABNORMAL LOW (ref 36.0–46.0)
HCT: 31.3 % — ABNORMAL LOW (ref 36.0–46.0)
Hemoglobin: 9.8 g/dL — ABNORMAL LOW (ref 12.0–15.0)
Hemoglobin: 10.5 g/dL — ABNORMAL LOW (ref 12.0–15.0)
Hemoglobin: 10.6 g/dL — ABNORMAL LOW (ref 12.0–15.0)
MCH: 27.6 pg (ref 26.0–34.0)
MCH: 27.5 pg (ref 26.0–34.0)
MCH: 29 pg (ref 26.0–34.0)
MCHC: 33.3 g/dL (ref 30.0–36.0)
MCHC: 32.6 g/dL (ref 30.0–36.0)
MCHC: 33.9 g/dL (ref 30.0–36.0)
MCV: 82.8 fL (ref 80.0–100.0)
MCV: 84.3 fL (ref 80.0–100.0)
MCV: 85.8 fL (ref 80.0–100.0)
Platelets: 349 10*3/uL (ref 150–400)
Platelets: 370 10*3/uL (ref 150–400)
Platelets: 356 10*3/uL (ref 150–400)
RBC: 3.55 MIL/uL — ABNORMAL LOW (ref 3.87–5.11)
RBC: 3.82 MIL/uL — ABNORMAL LOW (ref 3.87–5.11)
RBC: 3.65 MIL/uL — ABNORMAL LOW (ref 3.87–5.11)
RDW: 17.2 % — ABNORMAL HIGH (ref 11.5–15.5)
RDW: 17.2 % — ABNORMAL HIGH (ref 11.5–15.5)
RDW: 17 % — ABNORMAL HIGH (ref 11.5–15.5)
WBC: 30.2 10*3/uL — ABNORMAL HIGH (ref 4.0–10.5)
WBC: 31.5 10*3/uL — ABNORMAL HIGH (ref 4.0–10.5)
WBC: 33 10*3/uL — ABNORMAL HIGH (ref 4.0–10.5)
nRBC: 0.1 % (ref 0.0–0.2)
nRBC: 0.1 % (ref 0.0–0.2)
nRBC: 0.1 % (ref 0.0–0.2)

## 2022-10-10 LAB — GLUCOSE, CAPILLARY
Glucose-Capillary: 92 mg/dL (ref 70–99)
Glucose-Capillary: 94 mg/dL (ref 70–99)
Glucose-Capillary: 110 mg/dL — ABNORMAL HIGH (ref 70–99)
Glucose-Capillary: 137 mg/dL — ABNORMAL HIGH (ref 70–99)
Glucose-Capillary: 204 mg/dL — ABNORMAL HIGH (ref 70–99)
Glucose-Capillary: 194 mg/dL — ABNORMAL HIGH (ref 70–99)
Glucose-Capillary: 184 mg/dL — ABNORMAL HIGH (ref 70–99)

## 2022-10-10 LAB — BASIC METABOLIC PANEL
Anion gap: 7 (ref 5–15)
Anion gap: 4 — ABNORMAL LOW (ref 5–15)
BUN: 30 mg/dL — ABNORMAL HIGH (ref 6–20)
BUN: 33 mg/dL — ABNORMAL HIGH (ref 6–20)
CO2: 23 mmol/L (ref 22–32)
CO2: 23 mmol/L (ref 22–32)
Calcium: 7.4 mg/dL — ABNORMAL LOW (ref 8.9–10.3)
Calcium: 7.7 mg/dL — ABNORMAL LOW (ref 8.9–10.3)
Chloride: 101 mmol/L (ref 98–111)
Chloride: 106 mmol/L (ref 98–111)
Creatinine, Ser: 1.93 mg/dL — ABNORMAL HIGH (ref 0.44–1.00)
Creatinine, Ser: 1.75 mg/dL — ABNORMAL HIGH (ref 0.44–1.00)
GFR, Estimated: 35 mL/min — ABNORMAL LOW (ref >60)
GFR, Estimated: 39 mL/min — ABNORMAL LOW (ref >60)
Glucose, Bld: 111 mg/dL — ABNORMAL HIGH (ref 70–99)
Glucose, Bld: 165 mg/dL — ABNORMAL HIGH (ref 70–99)
Potassium: 3.5 mmol/L (ref 3.5–5.1)
Potassium: 4.1 mmol/L (ref 3.5–5.1)
Sodium: 131 mmol/L — ABNORMAL LOW (ref 135–145)
Sodium: 133 mmol/L — ABNORMAL LOW (ref 135–145)

## 2022-10-10 LAB — AEROBIC/ANAEROBIC CULTURE W GRAM STAIN (SURGICAL/DEEP WOUND): Culture: NO GROWTH

## 2022-10-10 LAB — ZINC: Zinc: 34 ug/dL — ABNORMAL LOW (ref 44–115)

## 2022-10-10 LAB — PROTIME-INR
INR: 1.3 — ABNORMAL HIGH (ref 0.8–1.2)
INR: 1.3 — ABNORMAL HIGH (ref 0.8–1.2)
INR: 1.3 — ABNORMAL HIGH (ref 0.8–1.2)
Prothrombin Time: 16 s — ABNORMAL HIGH (ref 11.4–15.2)
Prothrombin Time: 16.4 seconds — ABNORMAL HIGH (ref 11.4–15.2)
Prothrombin Time: 15.9 s — ABNORMAL HIGH (ref 11.4–15.2)

## 2022-10-10 LAB — COPPER, SERUM: Copper: 39 ug/dL — ABNORMAL LOW (ref 80–158)

## 2022-10-10 LAB — MAGNESIUM
Magnesium: 1.9 mg/dL (ref 1.7–2.4)
Magnesium: 1.8 mg/dL (ref 1.7–2.4)

## 2022-10-10 LAB — PHOSPHORUS
Phosphorus: 2.7 mg/dL (ref 2.5–4.6)
Phosphorus: 3.6 mg/dL (ref 2.5–4.6)

## 2022-10-10 MED ORDER — METRONIDAZOLE 500 MG PO TABS
500.0000 mg | ORAL_TABLET | Freq: Two times a day (BID) | ORAL | Status: DC
  Administered 2022-10-10 – 2022-10-15 (×10): 500 mg
  Filled 2022-10-10 (×10): qty 1

## 2022-10-10 MED ORDER — SODIUM PHOSPHATES 45 MMOLE/15ML IV SOLN
15.0000 mmol | Freq: Once | INTRAVENOUS | Status: AC
  Administered 2022-10-10: 15 mmol via INTRAVENOUS
  Filled 2022-10-10: qty 5

## 2022-10-10 MED ORDER — ORAL CARE MOUTH RINSE: 15.0000 mL | OROMUCOSAL | Status: DC | PRN

## 2022-10-10 MED ORDER — OXYCODONE-ACETAMINOPHEN 7.5-325 MG PO TABS
2.0000 | ORAL_TABLET | Freq: Three times a day (TID) | ORAL | Status: DC | PRN
  Administered 2022-10-10 – 2022-10-12 (×5): 2 via ORAL
  Filled 2022-10-10 (×5): qty 2

## 2022-10-10 MED ORDER — POTASSIUM CHLORIDE 20 MEQ PO PACK
40.0000 meq | PACK | Freq: Once | ORAL | Status: AC
  Administered 2022-10-10: 40 meq
  Filled 2022-10-10: qty 2

## 2022-10-10 MED ORDER — HEPARIN SODIUM (PORCINE) 5000 UNIT/ML IJ SOLN
5000.0000 [IU] | Freq: Three times a day (TID) | INTRAMUSCULAR | Status: DC
  Administered 2022-10-10 – 2022-10-18 (×21): 5000 [IU] via SUBCUTANEOUS
  Filled 2022-10-10 (×23): qty 1

## 2022-10-10 MED ORDER — INSULIN DETEMIR 100 UNIT/ML ~~LOC~~ SOLN
5.0000 [IU] | Freq: Two times a day (BID) | SUBCUTANEOUS | Status: DC
  Administered 2022-10-10 – 2022-10-21 (×19): 5 [IU] via SUBCUTANEOUS
  Filled 2022-10-10 (×26): qty 0.05

## 2022-10-10 MED ORDER — ORAL CARE MOUTH RINSE
15.0000 mL | OROMUCOSAL | Status: DC
  Administered 2022-10-10 – 2022-10-11 (×5): 15 mL via OROMUCOSAL

## 2022-10-10 NOTE — Inpatient Diabetes Management (Signed)
Inpatient Diabetes Program Recommendations  AACE/ADA: New Consensus Statement on Inpatient Glycemic Control (2015)  Target Ranges:  Prepandial:   less than 140 mg/dL      Peak postprandial:   less than 180 mg/dL (1-2 hours)      Critically ill patients:  140 - 180 mg/dL   Lab Results  Component Value Date   GLUCAP 137 (H) 10/10/2022   HGBA1C 8.8 (A) 06/18/2022    Latest Reference Range & Units 10/09/22 23:35 10/10/22 03:37 10/10/22 05:40 10/10/22 07:43 10/10/22 11:07  Glucose-Capillary 70 - 99 mg/dL 409 (H) 92 94 811 (H) 914 (H)  (H): Data is abnormally high Review of Glycemic Control  Diabetes history: type 1 Outpatient Diabetes medications: Levemir 8 units BID, Novolin Regular flexpen 1-10 units scale 4X/day Current orders for Inpatient glycemic control: Levemir 8 units BID, Novolog 0-9 units correction scale every 4 hours  Inpatient Diabetes Program Recommendations:   Noted that patient is tightly controlled.   Recommend decreasing Levemir to 5 units BID if blood sugars continue to be less than 100 mg/dl.  Smith Mince RN BSN CDE Diabetes Coordinator Pager: [phone]  8am-5pm

## 2022-10-10 NOTE — Procedures (Signed)
Extubation Procedure Note  Patient Details:   Name: Kelli Hudson DOB: [birth year] MRN: [id number]   Airway Documentation:    Vent end date: 10/10/22 Vent end time: 0823   Evaluation  O2 sats: stable throughout Complications: No apparent complications Patient did tolerate procedure well. Bilateral Breath Sounds: Clear, Diminished   Yes,pt could speak post extuabtion.  Pt extubated to room air without difficulty.  Audrie Lia 10/10/2022, 8:25 AM

## 2022-10-10 NOTE — Progress Notes (Signed)
NAME:  Kelli Hudson, MRN:  [id number], DOB:  09/29/1990, LOS: 7 ADMISSION DATE:  10/03/2022, CONSULTATION DATE:  10/08/22 REFERRING MD:  Lajoyce Corners, CHIEF COMPLAINT: nec fasc   History of Present Illness:  [name] PMH Dm1 diabetic gastroparesis now s/p gastric pacemaker, HTN admitted 8/17 with leg swelling -- BLE foot wounds, which  were being followed outpt by podiatry. Admitted to [facility] for sepsis in this setting, OR with podiatry 8/18 for I&D BLE foot wounds.   ID was consulted 8/20 for ongoing fever. Concern for nec fasc 8/21 ortho consulted -- transferred to [facility], underwent excision and debridement of BLE --extending feet to thighs. EBL from this case is not recorded but pt did receive 4 PRBC 2 FFP as well as crystalloid + colloid, as well as pressors for hemodynamic support .   Remains intubated after case. PCCM consulted for ICU management.   Pertinent  Medical History  DM1 Diabetic gastroparesis S/p gastric pacemaker  HTN  Significant Hospital Events: Including procedures, antibiotic start and stop dates in addition to other pertinent events   8/17 admit to [facility] sepsis BLE wound infections. Podiatry consult 8/18 I&D with podiatry.  8/20 ID consult  8/21 ortho consult. OR for excision and debridement of BLE nec fasc-- feet to thighs. Remained intubated, PCCM consulted.  4 PRBC 2 FFP during case.   Interim History / Subjective:  This morning she denies complaints.  Awake alert weaning well  Objective   Blood pressure 128/61, pulse 100, temperature (!) 97.5 F (36.4 C), resp. rate (!) 0, height 5\' 7"  (1.702 m), weight 106.7 kg, SpO2 99%.    Vent Mode: PRVC FiO2 (%):  [30 %] 30 % Set Rate:  [24 bmp] 24 bmp Vt Set:  [490 mL] 490 mL PEEP:  [5 cmH20] 5 cmH20 Plateau Pressure:  [16 cmH20-17 cmH20] 17 cmH20   Intake/Output Summary (Last 24 hours) at 10/10/2022 0811 Last data filed at 10/10/2022 0630 Gross per 24 hour  Intake 3734.67 ml  Output 5010 ml  Net -1275.33 ml    Filed Weights   10/03/22 2301 10/08/22 1344 10/10/22 0500  Weight: 84.8 kg 84.4 kg 106.7 kg    Examination: General: Critically ill-appearing HEENT: Moist oral mucosa, endotracheal tube in place Neuro: Awake alert interactive CV: S1-S2 appreciated PULM: Clear breath sounds GI: Bowel sounds appreciated Extremities: Wound vacs bilateral lower extremities, pitting edema in her hands Skin: Warm, dry  I reviewed nursing notes, last 24 h vitals and pain scores, last 48 h intake and output, last 24 h labs and trends, and last 24 h imaging results. Resolved Hospital Problem list     Assessment & Plan:   Bilateral feet abscess, necrotizing fasciitis.  S/p bilateral excisional leg and thigh debridement Severe sepsis -Orthopedics continues to follow -Not on pressors -Continue to follow cultures -Continue aggressive care  Postop vent management -Patient is weaning well at the present time -Will plan to extubate if she continues to wean well  Acute blood loss anemia due to operative blood loss -Transfuse per protocol  Acute kidney injury on chronic kidney disease stage IIIa Electrolyte derangements -Replete electrolytes  Type 1 diabetes Uncontrolled diabetes -Goal blood sugar of 140-180 -Continue Levemir   Protein calorie malnutrition -Continue Reglan  Gastroparesis s/p gastric pacemaker   Hypertension Hyperlipidemia -Hold antihypertensive at present  Leukocytosis -Slowly improving  Remains critically ill  Best Practice (right click and "Reselect all SmartList Selections" daily)   Diet/type: tubefeeds DVT prophylaxis: other; given ABLA post op will  hold for now; no scds given wound vac  GI prophylaxis: H2B Lines: Central line and Arterial Line Foley:  N/A Code Status:  full code Last date of multidisciplinary goals of care discussion [pending]  Labs   CBC: Recent Labs  Lab 10/03/22 1354 10/03/22 2029 10/08/22 2000 10/08/22 2001 10/09/22 0329  10/09/22 0900 10/09/22 1802 10/10/22 0236  WBC 31.3*   < > 43.1*  --  47.3* 44.7* 34.5* 30.2*  NEUTROABS 27.8*  --   --   --   --   --   --   --   HGB 6.4*   < > 10.0* 9.5* 10.0* 10.0* 9.4* 9.8*  HCT 20.2*   < > 29.4* 28.0* 29.7* 29.7* 27.7* 29.4*  MCV 89.0   < > 84.7  --  82.5 82.7 84.2 82.8  PLT 566*   < > 339  347  --  335 360 339 349   < > = values in this interval not displayed.    Basic Metabolic Panel: Recent Labs  Lab 10/08/22 0442 10/08/22 1718 10/08/22 1723 10/08/22 1802 10/08/22 2000 10/08/22 2001 10/09/22 0329 10/09/22 0900 10/09/22 1802 10/10/22 0236  NA 129*   < > 131*   < > 132* 135 131*  131*  --  131* 131*  K 4.5   < > 4.8   < > 4.9 4.5 4.4  4.3  --  3.7 3.5  CL 102  --  105  --  103  --  100  101  --  102 101  CO2 17*  --   --   --  18*  --  20*  19*  --  22 23  GLUCOSE 125*  --  153*  --  171*  --  213*  211*  --  141* 111*  BUN 23*  --  22*  --  23*  --  24*  24*  --  27* 30*  CREATININE 1.71*  --  1.90*  --  1.93*  --  1.99*  1.90*  --  2.04* 1.93*  CALCIUM 7.9*  --   --   --  7.9*  --  7.8*  7.7*  --  7.1* 7.4*  MG 1.7  --   --   --  1.6*  --   --  1.9 1.8 1.9  PHOS  --   --   --   --   --   --   --  4.0 3.2 2.7   < > = values in this interval not displayed.   GFR: Estimated Creatinine Clearance: 53.1 mL/min (A) (by C-G formula based on SCr of 1.93 mg/dL (H)). Recent Labs  Lab 10/03/22 1354 10/04/22 0409 10/07/22 0822 10/08/22 0442 10/09/22 0329 10/09/22 0900 10/09/22 1802 10/10/22 0236  WBC 31.3*   < >  --    < > 47.3* 44.7* 34.5* 30.2*  LATICACIDVEN 0.9  --  0.8  --   --   --   --   --    < > = values in this interval not displayed.     ABG    Component Value Date/Time   PHART 7.362 10/08/2022 2001   PCO2ART 30.0 (L) 10/08/2022 2001   PO2ART 189 (H) 10/08/2022 2001   HCO3 17.3 (L) 10/08/2022 2001   TCO2 18 (L) 10/08/2022 2001   ACIDBASEDEF 8.0 (H) 10/08/2022 2001   O2SAT 100 10/08/2022 2001      Critical care time:  The patient is critically ill with multiple organ systems failure and requires high complexity decision making for assessment and support, frequent evaluation and titration of therapies, application of advanced monitoring technologies and extensive interpretation of multiple databases. Critical Care Time devoted to patient care services described in this note independent of APP/resident time (if applicable)  is 32 minutes.   Virl Diamond MD [facility] Personal pager: See Amion If unanswered, please page CCM On-call: #[phone]

## 2022-10-10 NOTE — Procedures (Signed)
Cortrak  Person Inserting Tube:  Hunter, Rachel D, RD Tube Type:  Cortrak - 55 inches Tube Size:  10 Tube Location:  Left nare Secured by: Bridle Technique Used to Measure Tube Placement:  Marking at nare/corner of mouth Cortrak Secured At:  69 cm Procedure Comments:  Cortrak Tube Team Note:  Consult received to place a Cortrak feeding tube.   X-ray is required, abdominal x-ray has been ordered by the Cortrak team. Please confirm tube placement before using the Cortrak tube.   If the tube becomes dislodged please keep the tube and contact the Cortrak team at [email or web address] for replacement.  If after hours and replacement cannot be delayed, place a NG tube and confirm placement with an abdominal x-ray.    Rachel Hunter, RD, LDN Clinical Dietitian RD pager # available in AMION  After hours/weekend pager # available in AMION     

## 2022-10-10 NOTE — Evaluation (Signed)
Clinical/Bedside Swallow Evaluation Patient Details  Name: Kelli Hudson MRN: [id number] Date of Birth: 1990-07-19  Today's Date: 10/10/2022 Time: SLP Start Time (ACUTE ONLY): 1320 SLP Stop Time (ACUTE ONLY): 1335 SLP Time Calculation (min) (ACUTE ONLY): 15 min  Past Medical History:  Past Medical History:  Diagnosis Date   Acute H. 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   Procedure: BILATERAL LEG DEBRIDEMENT;  Surgeon: Nadara Mustard, MD;  Location: [facility] OR;  Service: Orthopedics;  Laterality: Bilateral;   AMPUTATION TOE Left 03/10/2018   Procedure: AMPUTATION FIFTH TOE;  Surgeon: Vivi Barrack, DPM;  Location: [facility];  Service: Podiatry;  Laterality: Left;   BIOPSY  01/28/2020   Procedure: BIOPSY;  Surgeon: Kathi Der, MD;  Location: [facility];  Service: Gastroenterology;;   BOTOX INJECTION  08/26/2021   Procedure: BOTOX INJECTION;  Surgeon: Sherrilyn Rist, MD;  Location: [facility];  Service: Gastroenterology;;   ESOPHAGOGASTRODUODENOSCOPY N/A 01/28/2020   Procedure: ESOPHAGOGASTRODUODENOSCOPY (EGD);  Surgeon: Kathi Der, MD;  Location: [facility];  Service: Gastroenterology;  Laterality: N/A;   ESOPHAGOGASTRODUODENOSCOPY (EGD) WITH PROPOFOL Left 09/08/2015   Procedure: ESOPHAGOGASTRODUODENOSCOPY (EGD) WITH PROPOFOL;  Surgeon: Willis Modena, MD;  Location: [facility];  Service: Endoscopy;  Laterality: Left;   ESOPHAGOGASTRODUODENOSCOPY (EGD) WITH PROPOFOL N/A 08/26/2021   Procedure:  ESOPHAGOGASTRODUODENOSCOPY (EGD) WITH PROPOFOL;  Surgeon: Sherrilyn Rist, MD;  Location: [facility];  Service: Gastroenterology;  Laterality: N/A;   GASTRIC STIMULATOR IMPLANT SURGERY  06/2022   at [facility]   IRRIGATION AND DEBRIDEMENT FOOT Bilateral 10/05/2022   Procedure: IRRIGATION AND DEBRIDEMENT FOOT;  Surgeon: [name], DPM;  Location: [facility];  Service: Orthopedics/Podiatry;  Laterality: Bilateral;   WISDOM TOOTH EXTRACTION     HPI:  [name] PMH Dm1 diabetic gastroparesis now s/p gastric pacemaker, HTN admitted 8/17 with leg swelling -- BLE foot wounds, which  were being followed outpt by podiatry. Admitted to [facility] for sepsis in this setting, OR with podiatry 8/18 for I&D BLE foot wounds.    ID was consulted 8/20 for ongoing fever. Concern for nec fasc  8/21 ortho consulted -- transferred to [facility], underwent excision and debridement of BLE --extending feet to thighs. Intubated from 8/21-8/23. Protein energy malnutrition, history of gastroparesis causing poor p.o. intake, baseline protein status very poor.    Assessment / Plan / Recommendation  Clinical Impression  Pt demonstrates no signs of acute dysphagia following extubation. Vocal quality clear, able to take 3 oz of water consecutively. Pt did have one cough after some applesauce, but did not appear realted to swallowing. Pt reports she often feels afraid to eat at baseline because she is afraid to vomit. When asked what she is able to eat without feeling scared she named items on a clear liquid diet. She is able to tolerate solids orally, but may need to gradually attempt these. Pt does not have an oral or oropharyngeal dysphagia. No need for further SLP interventions; MD to advance diet as tolerated. SLP Visit Diagnosis: Dysphagia, unspecified (R13.10)  Aspiration Risk  Risk for inadequate nutrition/hydration    Diet Recommendation Regular;Thin liquid (will start clear liquids and MD to advance to regular per pt wishes)     Medication Administration: Whole meds with liquid Supervision: Patient able to self feed    Other  Recommendations Oral Care Recommendations: Oral care BID    Recommendations for follow up therapy are one component of a multi-disciplinary discharge planning process, led by the attending physician.  Recommendations may be updated based on patient status, additional functional criteria and insurance authorization.  Follow up Recommendations        Assistance Recommended at Discharge    Functional Status Assessment    Frequency and Duration            Prognosis Prognosis for improved oropharyngeal function: Good      Swallow Study   General HPI: [name] PMH Dm1 diabetic gastroparesis now s/p gastric pacemaker, HTN admitted 8/17 with leg swelling -- BLE foot wounds, which  were being followed outpt by podiatry. Admitted to [facility] for sepsis in this setting, OR with podiatry 8/18 for I&D BLE foot wounds.    ID was consulted 8/20 for ongoing fever. Concern for nec fasc  8/21 ortho consulted -- transferred to [facility], underwent excision and debridement of BLE --extending feet to thighs. Intubated from 8/21-8/23. Protein energy malnutrition, history of gastroparesis causing poor p.o. intake, baseline protein status very poor. Type of Study: Bedside Swallow Evaluation Diet Prior to this Study: NPO History of Recent Intubation: No Behavior/Cognition: Alert;Cooperative;Pleasant mood Oral Cavity Assessment: Within Functional Limits Oral Care Completed by SLP: No Oral Cavity - Dentition: Adequate natural dentition Self-Feeding Abilities: Able to feed self Patient Positioning: Upright in bed Baseline Vocal Quality: Normal Volitional Cough: Strong Volitional Swallow: Able to elicit    Oral/Motor/Sensory Function Overall Oral Motor/Sensory Function: Within functional limits   Ice Chips Ice chips: Not tested   Thin Liquid Thin Liquid: Within functional limits    Nectar Thick Nectar Thick Liquid:  Not tested   Honey Thick Honey Thick Liquid: Not tested   Puree Puree: Impaired Pharyngeal Phase Impairments: Cough - Delayed   Solid     Solid: Not tested      Kelli Hudson, Kelli Hudson 10/10/2022,1:59 PM

## 2022-10-10 NOTE — Plan of Care (Signed)
  Problem: Education: Goal: Knowledge of General Education information will improve Description: Including pain rating scale, medication(s)/side effects and non-pharmacologic comfort measures Outcome: Progressing   Problem: Health Behavior/Discharge Planning: Goal: Ability to manage health-related needs will improve Outcome: Progressing   Problem: Clinical Measurements: Goal: Ability to maintain clinical measurements within normal limits will improve Outcome: Progressing Goal: Will remain free from infection Outcome: Progressing Goal: Diagnostic test results will improve Outcome: Progressing Goal: Respiratory complications will improve Outcome: Progressing Goal: Cardiovascular complication will be avoided Outcome: Progressing   Problem: Activity: Goal: Risk for activity intolerance will decrease Outcome: Progressing   Problem: Nutrition: Goal: Adequate nutrition will be maintained Outcome: Progressing   Problem: Coping: Goal: Level of anxiety will decrease Outcome: Progressing   Problem: Elimination: Goal: Will not experience complications related to bowel motility Outcome: Progressing Goal: Will not experience complications related to urinary retention Outcome: Progressing   Problem: Pain Managment: Goal: General experience of comfort will improve Outcome: Progressing   Problem: Safety: Goal: Ability to remain free from injury will improve Outcome: Progressing   Problem: Skin Integrity: Goal: Risk for impaired skin integrity will decrease Outcome: Progressing   Problem: Education: Goal: Ability to describe self-care measures that may prevent or decrease complications (Diabetes Survival Skills Education) will improve Outcome: Progressing Goal: Individualized Educational Video(s) Outcome: Progressing   Problem: Coping: Goal: Ability to adjust to condition or change in health will improve Outcome: Progressing   Problem: Fluid Volume: Goal: Ability to  maintain a balanced intake and output will improve Outcome: Progressing   Problem: Health Behavior/Discharge Planning: Goal: Ability to identify and utilize available resources and services will improve Outcome: Progressing Goal: Ability to manage health-related needs will improve Outcome: Progressing   Problem: Metabolic: Goal: Ability to maintain appropriate glucose levels will improve Outcome: Progressing   Problem: Nutritional: Goal: Maintenance of adequate nutrition will improve Outcome: Progressing Goal: Progress toward achieving an optimal weight will improve Outcome: Progressing   Problem: Skin Integrity: Goal: Risk for impaired skin integrity will decrease Outcome: Progressing   Problem: Tissue Perfusion: Goal: Adequacy of tissue perfusion will improve Outcome: Progressing   Problem: Education: Goal: Knowledge of the prescribed therapeutic regimen will improve Outcome: Progressing Goal: Ability to verbalize activity precautions or restrictions will improve Outcome: Progressing Goal: Understanding of discharge needs will improve Outcome: Progressing   Problem: Activity: Goal: Ability to perform//tolerate increased activity and mobilize with assistive devices will improve Outcome: Progressing   Problem: Clinical Measurements: Goal: Postoperative complications will be avoided or minimized Outcome: Progressing   Problem: Self-Care: Goal: Ability to meet self-care needs will improve Outcome: Progressing   Problem: Self-Concept: Goal: Ability to maintain and perform role responsibilities to the fullest extent possible will improve Outcome: Progressing   Problem: Pain Management: Goal: Pain level will decrease with appropriate interventions Outcome: Progressing   Problem: Skin Integrity: Goal: Demonstration of wound healing without infection will improve Outcome: Progressing   Problem: Activity: Goal: Ability to tolerate increased activity will  improve Outcome: Progressing   Problem: Respiratory: Goal: Ability to maintain a clear airway and adequate ventilation will improve Outcome: Progressing   Problem: Role Relationship: Goal: Method of communication will improve Outcome: Progressing

## 2022-10-10 NOTE — Progress Notes (Signed)
Nutrition Follow-up  DOCUMENTATION CODES:  Not applicable  INTERVENTION:  Resume tube feeding via Cortrak: Vital 1.5 at 60 ml/h (1440 ml per day) Continue to advance by 10 mL/hour every 12 hours to goal rate of 60 mL/hour Prosource TF20 60 ml 1x/d Provides 2240 kcal, 117 gm protein, 1100 ml free water daily Monitor results of micronutrients: Zinc, vitamin C, Vitamin A, copper. Add replacement as needed 1 packet Juven BID, each packet provides 95 calories, 2.5 grams of protein (collagen), and 9.8 grams of carbohydrate (3 grams sugar); also contains 7 grams of L-arginine and L-glutamine, 300 mg vitamin C, 15 mg vitamin E, 1.2 mcg vitamin B-12, 9.5 mg zinc, 200 mg calcium, and 1.5 g  Calcium Beta-hydroxy-Beta-methylbutyrate to support wound healing MVI with minerals daily Vitamin D 1000 international units x 30 days for deficiency   NUTRITION DIAGNOSIS:   Increased nutrient needs related to wound healing as evidenced by estimated needs.  Ongoing.  GOAL:   Patient will meet greater than or equal to 90% of their needs  Progressing with advancement of tube feeds.  MONITOR:   PO intake, Supplement acceptance  REASON FOR ASSESSMENT:   Consult Enteral/tube feeding initiation and management  ASSESSMENT:   32 y.o. female admits related to leg swelling. PMH includes: acute H. Pylori gastric ulcer, DM, gastroparesis, DKA, GERD, HTN, hyperthyroidism, normocytic anemia. Pt is currently receiving medical management related to sepsis due to cellulitis.  8/18 - OR, I&D of bilateral feet 8/21 - transferred to ICU, OR excisional debridement of bilateral legs (feet to thigh), 4 wound vacs placed in OR  08/23 - Cortrak tube placed, extubated  Patient extubated this AM. Cortrak tube placed. Tube feeds were initiated yesterday at trickle rate. Attempt was made at placing tube post-pyloric, but tube terminates in distal stomach per abdominal x-ray. Plan is to resume tube feeds at previous rate  and continue advancing towards goal rate.  Pt was 84.8 kg on 8/16 and 84.4 kg on 8/21. Pt documented to be 106.7 kg on 8/23. Unsure if related to measurement on different scale vs fluid status. Documented to have mild pitting edema. Recommend continuing to monitor weight trends.  Enteral Access: 10 Fr. Cortrak tube placed 8/23 left nare; terminates in distal stomach in the region of the pylorus  Tube feeds: Vital 1.5 at 20 mL/hour (advancing towards goal of 60 mL/hour) + PROSOurce TF 60 mL daily per tube  Medications reviewed and include: vitamin C 500 mg BID, Colace 100 mg BID, famotidine, folic acid 1 mg daily per tube, gabapentin, Novolog 0-9 units Q4hrs, Levemir 5 units BID, Reglan 10 mg every 8 hours IV, Flagyl, MVI daily per tube, Juven BID per tube, Miralax, thiamine 100 mg daily per tube, zinc sulfate 220 mg daily per tube x 14 days, cefepime, D5-NS at 75 mL/hour, fentanyl gtt, linezolid, sodium phosphate 15 mmol IV once today  Labs reviewed: Sodium 131, BUN 30, Creatinine 1.93, Phosphorus 2.7  Micronutrient Profile: CRP 17.2 (high, may skew micronutrient results) Vitamin D: 21.49 (low) Vitamin A pending Vitamin C pending Zinc pending Copper pending  UOP: 1120 mL (0.4 mL/kg/hr) x previous 24 hours Output from wound VACs: 2890 mL x previous 24 hours  I/O: +17731.5 mL since admission  Diet Order:   Diet Order             Diet clear liquid Room service appropriate? Yes; Fluid consistency: Thin  Diet effective now  EDUCATION NEEDS:  Not appropriate for education at this time  Skin:  Skin Integrity Issues: Stage 2: - right elbow ( 2.5 cm x 3 cm x 0.1 cm) Surgical incisions: - bilateral legs, wound vac x 4 (2 to each leg)   Last BM:  8/18 - type 7  Height:  Ht Readings from Last 1 Encounters:  10/08/22 5\' 7"  (1.702 m)   Weight:  Wt Readings from Last 1 Encounters:  10/10/22 106.7 kg   Ideal Body Weight:  61.4 kg  BMI:  Body mass index is  36.84 kg/m.  Estimated Nutritional Needs:  Kcal:  2200-2500 kcal/d Protein:  115-130g/d Fluid:  >/= 2.3 L  Arlina Sabina Tollie Eth, MS, RD, LDN, CNSC Pager number [email or web address] on Amion

## 2022-10-10 NOTE — Progress Notes (Signed)
[facility]  Date of Admission:  10/03/2022     Total days of antibiotics 8         ASSESSMENT:  Ms. 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 Leukocytosis is improving. Plans to return to the OR this weekend or early next week for additional debridement. Discussed continued plan of care with combination of surgical intervention and antibiotics. Continue current dose of linezolid, cefepime and metronidazole. Post-operative wound care per Orthopedics. Monitor cultures for organism sensitivities. Remains at high risk secondary to diabetes for complicated healing and further infection. Remaining medical and supportive care per CCM.   PLAN:  Continue current dose of linezolid, cefepime and metronidazole.  Post-operative wound care and further surgical interventions per Orthopedics.  Therapeutic monitoring of platelets while on linezolid. Monitor cultures for sensitivities and any potential additional organisms.  Remaining medical and supportive care per CCM.    I have personally spent 28 minutes involved in face-to-face and non-face-to-face activities for this patient on the day of the visit. Professional time spent includes the following activities: Preparing to see the patient (review of tests), Obtaining and/or reviewing separately obtained history (admission/discharge record), Performing a medically appropriate examination and/or evaluation , Ordering medications/tests/procedures, referring and communicating with other health care professionals, Documenting clinical information in the EMR, Independently interpreting results (not separately reported), Communicating results to the patient/family/caregiver, Counseling and educating the patient/family/caregiver and Care coordination (not separately reported).    Principal  Problem:   Sepsis due to cellulitis [facility]) Active Problems:   Anemia   Sepsis (HCC)   AKI (acute kidney injury) (HCC)   Diabetic gastroparesis associated with type 1 diabetes mellitus (HCC)   DM type 1 (diabetes mellitus, type 1) (HCC)   Drug-seeking behavior   Cellulitis of lower extremity   Necrotizing fasciitis of pelvic region and thigh (HCC)   Necrotizing fasciitis of lower leg (HCC)   Necrotizing fasciitis (HCC)    vitamin C  500 mg Per Tube BID   Chlorhexidine Gluconate Cloth  6 each Topical Daily   docusate  100 mg Per Tube BID   famotidine  20 mg Per Tube Daily   feeding supplement (PROSource TF20)  60 mL Per Tube Daily   folic acid  1 mg Per Tube Daily   gabapentin  300 mg Per Tube Q8H   insulin aspart  0-9 Units Subcutaneous Q4H   insulin detemir  8 Units Subcutaneous BID   metoCLOPramide (REGLAN) injection  10 mg Intravenous Q8H   metroNIDAZOLE  500 mg Per Tube Q12H   multivitamin with minerals  1 tablet Per Tube Daily   nutrition supplement (JUVEN)  1 packet Per Tube BID BM   mouth rinse  15 mL Mouth Rinse Q2H   polyethylene glycol  17 g Per Tube Daily   sodium chloride flush  10-40 mL Intracatheter Q12H   thiamine  100 mg Per Tube Daily   zinc sulfate  220 mg Per Tube Daily    SUBJECTIVE:  Afebrile overnight with decreasing leukocytosis and no acute events. Feeling okay. Family visiting.   Allergies  Allergen Reactions   Ibuprofen Other (See Comments)    07/23/22 - Worsening kidney function after 3 doses of ibuprofen 400mg .  Creatinine increased from 1.3 to 2.  Creatinine returned to normal after stopping.   Lisinopril Cough, Other (See Comments), Itching and Rash    Breakout in rash  Review of Systems: Review of Systems  Constitutional:  Negative for chills, fever and weight loss.  Respiratory:  Negative for cough, shortness of breath and wheezing.   Cardiovascular:  Negative for chest pain and leg swelling.  Gastrointestinal:  Negative for  abdominal pain, constipation, diarrhea, nausea and vomiting.  Skin:  Negative for rash.      OBJECTIVE: Vitals:   10/10/22 0845 10/10/22 0900 10/10/22 1000 10/10/22 1100  BP:      Pulse: 99 97 100 (!) 104  Resp: 12 12 11 16   Temp: 99 F (37.2 C) 99 F (37.2 C) 99.1 F (37.3 C) 99.1 F (37.3 C)  TempSrc:      SpO2: 93% 93% 94% 95%  Weight:      Height:       Body mass index is 36.84 kg/m.  Physical Exam Constitutional:      General: She is not in acute distress.    Appearance: She is well-developed. She is ill-appearing.     Comments: Lying in bed with head of bed elevated  HENT:     Head:     Comments: Coretrak in place.  Cardiovascular:     Rate and Rhythm: Normal rate and regular rhythm.     Heart sounds: Normal heart sounds.     Comments: PICC line, ART line functioning with clean and dry dressings.  Pulmonary:     Effort: Pulmonary effort is normal.     Breath sounds: Normal breath sounds.  Genitourinary:    Comments: Foley catheter in place Skin:    General: Skin is warm and dry.     Comments: Bilateral wound vacs with serosanguinous drainage.   Neurological:     Mental Status: She is oriented to person, place, and time and easily aroused. She is lethargic.     Lab Results Lab Results  Component Value Date   WBC 30.2 (H) 10/10/2022   HGB 9.8 (L) 10/10/2022   HCT 29.4 (L) 10/10/2022   MCV 82.8 10/10/2022   PLT 349 10/10/2022    Lab Results  Component Value Date   CREATININE 1.93 (H) 10/10/2022   BUN 30 (H) 10/10/2022   NA 131 (L) 10/10/2022   K 3.5 10/10/2022   CL 101 10/10/2022   CO2 23 10/10/2022    Lab Results  Component Value Date   ALT 12 10/07/2022   AST 14 (L) 10/07/2022   ALKPHOS 110 10/07/2022   BILITOT 0.6 10/07/2022     Microbiology: Recent Results (from the past 240 hour(s))  Culture, blood (Routine x 2)     Status: None   Collection Time: 10/03/22  1:52 PM   Specimen: BLOOD RIGHT ARM  Result Value Ref Range Status    Specimen Description   Final    BLOOD RIGHT ARM Performed at [facility], [address], [place], [place] [address]    Special Requests   Final    BOTTLES DRAWN AEROBIC AND ANAEROBIC Blood Culture adequate volume Performed at [facility], [address], [place], [place] [address]    Culture   Final    NO GROWTH 5 DAYS Performed at [facility], [address] [place], [place] [address]    Report Status 10/08/2022 FINAL  Final  SARS Coronavirus 2 by RT PCR (hospital order, performed in [facility] hospital lab) *cepheid single result test* Anterior Nasal Swab     Status: None   Collection Time: 10/03/22  1:54 PM   Specimen: Anterior  Nasal Swab  Result Value Ref Range Status   SARS Coronavirus 2 by RT PCR NEGATIVE NEGATIVE Final    Comment: (NOTE) SARS-CoV-2 target nucleic acids are NOT DETECTED.  The SARS-CoV-2 RNA is generally detectable in upper and lower respiratory specimens during the acute phase of infection. The lowest concentration of SARS-CoV-2 viral copies this assay can detect is 250 copies / mL. A negative result does not preclude SARS-CoV-2 infection and should not be used as the sole basis for treatment or other patient management decisions.  A negative result may occur with improper specimen collection / handling, submission of specimen other than nasopharyngeal swab, presence of viral mutation(s) within the areas targeted by this assay, and inadequate number of viral copies (<250 copies / mL). A negative result must be combined with clinical observations, patient history, and epidemiological information.  Fact Sheet for Patients:   RoadLapTop.co.za  Fact Sheet for Healthcare Providers: http://kim-miller.com/  This test is not yet approved or  cleared by the [place] FDA and has been authorized for detection and/or diagnosis of SARS-CoV-2 by FDA under an Emergency Use  Authorization (EUA).  This EUA will remain in effect (meaning this test can be used) for the duration of the COVID-19 declaration under Section 564(b)(1) of the Act, 21 U.S.C. section 360bbb-3(b)(1), unless the authorization is terminated or revoked sooner.  Performed at [facility], [address] [place], [place] [address]   Aerobic/Anaerobic Culture w Gram Stain (surgical/deep wound)     Status: None (Preliminary result)   Collection Time: 10/05/22 10:48 AM   Specimen: Wound  Result Value Ref Range Status   Specimen Description   Final    WOUND WOUND, RIGHT FOOT Performed at [facility], [address] [place], [place] [address]    Special Requests   Final    NONE Performed at [facility], [address], [place], [place] [address]    Gram Stain   Final    MODERATE WBC PRESENT,BOTH PMN AND MONONUCLEAR NO ORGANISMS SEEN    Culture   Final    NO GROWTH 4 DAYS NO ANAEROBES ISOLATED; CULTURE IN PROGRESS FOR 5 DAYS Performed at [facility], [address] [place], [place] [address]    Report Status PENDING  Incomplete  MRSA Next Gen by PCR, Nasal     Status: None   Collection Time: 10/07/22 12:20 PM   Specimen: Nasal Mucosa; Nasal Swab  Result Value Ref Range Status   MRSA by PCR Next Gen NOT DETECTED NOT DETECTED Final    Comment: (NOTE) The GeneXpert MRSA Assay (FDA approved for NASAL specimens only), is one component of a comprehensive MRSA colonization surveillance program. It is not intended to diagnose MRSA infection nor to guide or monitor treatment for MRSA infections. Test performance is not FDA approved in patients less than 34 years old. Performed at [facility], [address] [place], [place] [address]   Culture, blood (Routine X 2) w Reflex to ID Panel     Status: None (Preliminary result)   Collection Time: 10/07/22 10:08 PM   Specimen: BLOOD LEFT HAND  Result Value  Ref Range Status   Specimen Description BLOOD LEFT HAND  Final   Special Requests   Final    BOTTLES DRAWN AEROBIC ONLY Blood Culture adequate volume   Culture   Final    NO GROWTH 2 DAYS Performed at [facility], [address], [place], [place] [address]  Report Status PENDING  Incomplete  Culture, blood (Routine X 2) w Reflex to ID Panel     Status: None (Preliminary result)   Collection Time: 10/07/22 10:10 PM   Specimen: BLOOD LEFT HAND  Result Value Ref Range Status   Specimen Description BLOOD LEFT HAND  Final   Special Requests   Final    BOTTLES DRAWN AEROBIC ONLY Blood Culture results may not be optimal due to an inadequate volume of blood received in culture bottles   Culture   Final    NO GROWTH 2 DAYS Performed at [facility], [address] [place], [place] [address]    Report Status PENDING  Incomplete  Aerobic/Anaerobic Culture w Gram Stain (surgical/deep wound)     Status: None (Preliminary result)   Collection Time: 10/08/22  4:11 PM   Specimen: Path Tissue  Result Value Ref Range Status   Specimen Description TISSUE  Final   Special Requests RIGHT CALF SPEC A  Final   Gram Stain   Final    RARE WBC PRESENT, PREDOMINANTLY PMN NO ORGANISMS SEEN Gram Stain Report Called to,Read Back By and Verified With: RN Budd Palmer [phone] @1740  FH    Culture   Final    CULTURE REINCUBATED FOR BETTER GROWTH Performed at [facility], [address] [place], [place] [address]    Report Status PENDING  Incomplete  Aerobic/Anaerobic Culture w Gram Stain (surgical/deep wound)     Status: None (Preliminary result)   Collection Time: 10/08/22  4:15 PM   Specimen: Path Tissue  Result Value Ref Range Status   Specimen Description TISSUE  Final   Special Requests RIGHT LATERAL CALF SPEC B  Final   Gram Stain   Final    NO WBC SEEN NO ORGANISMS SEEN Gram Stain Report Called to,Read Back By and Verified With: RN Budd Palmer [phone] @1740  FH Performed at  [facility], [address], [place], [place] [address]    Culture RARE STAPHYLOCOCCUS AUREUS  Final   Report Status PENDING  Incomplete  Aerobic/Anaerobic Culture w Gram Stain (surgical/deep wound)     Status: None (Preliminary result)   Collection Time: 10/08/22  4:19 PM   Specimen: Path Tissue  Result Value Ref Range Status   Specimen Description TISSUE  Final   Special Requests RIGHT FOOT SPEC C  Final   Gram Stain   Final    NO WBC SEEN NO ORGANISMS SEEN Gram Stain Report Called to,Read Back By and Verified With: RN SMajel Homer @1740  FH    Culture   Final    RARE STAPHYLOCOCCUS AUREUS RARE PSEUDOMONAS AERUGINOSA SUSCEPTIBILITIES TO FOLLOW CRITICAL RESULT CALLED TO, READ BACK BY AND VERIFIED WITH: RN [email or web address] AT 1207 ON 10/09/2022 BY T.SAAD. Performed at [facility], [address] [place], [place] [address]    Report Status PENDING  Incomplete  Aerobic/Anaerobic Culture w Gram Stain (surgical/deep wound)     Status: None (Preliminary result)   Collection Time: 10/08/22  4:48 PM   Specimen: Path Tissue  Result Value Ref Range Status   Specimen Description TISSUE LEFT THIGH  Final   Special Requests PT ON CEFEPIME,LINEZOLID, FLAGYL  Final   Gram Stain   Final    RARE WBC PRESENT, PREDOMINANTLY PMN NO ORGANISMS SEEN Gram Stain Report Called to,Read Back By and Verified With: RN SMajel Homer @1740  FH    Culture   Final    NO GROWTH 2 DAYS Performed  at [facility], [address], [place], [place] [address]    Report Status PENDING  Incomplete     Marcos Eke, NP [facility]  10/10/2022  11:36 AM

## 2022-10-11 DIAGNOSIS — A419 Sepsis, unspecified organism: Secondary | ICD-10-CM | POA: Diagnosis not present

## 2022-10-11 DIAGNOSIS — L0390 Cellulitis, unspecified: Secondary | ICD-10-CM | POA: Diagnosis not present

## 2022-10-11 LAB — CBC
HCT: 33.2 % — ABNORMAL LOW (ref 36.0–46.0)
HCT: 32.6 % — ABNORMAL LOW (ref 36.0–46.0)
Hemoglobin: 11 g/dL — ABNORMAL LOW (ref 12.0–15.0)
Hemoglobin: 10.6 g/dL — ABNORMAL LOW (ref 12.0–15.0)
MCH: 28.4 pg (ref 26.0–34.0)
MCH: 27.5 pg (ref 26.0–34.0)
MCHC: 33.1 g/dL (ref 30.0–36.0)
MCHC: 32.5 g/dL (ref 30.0–36.0)
MCV: 85.8 fL (ref 80.0–100.0)
MCV: 84.7 fL (ref 80.0–100.0)
Platelets: 364 10*3/uL (ref 150–400)
Platelets: 369 10*3/uL (ref 150–400)
RBC: 3.87 MIL/uL (ref 3.87–5.11)
RBC: 3.85 MIL/uL — ABNORMAL LOW (ref 3.87–5.11)
RDW: 17.1 % — ABNORMAL HIGH (ref 11.5–15.5)
RDW: 17.2 % — ABNORMAL HIGH (ref 11.5–15.5)
WBC: 40.6 10*3/uL — ABNORMAL HIGH (ref 4.0–10.5)
WBC: 42.3 10*3/uL — ABNORMAL HIGH (ref 4.0–10.5)
nRBC: 0 % (ref 0.0–0.2)
nRBC: 0 % (ref 0.0–0.2)

## 2022-10-11 LAB — PROTIME-INR
INR: 1.4 — ABNORMAL HIGH (ref 0.8–1.2)
INR: 1.3 — ABNORMAL HIGH (ref 0.8–1.2)
INR: 1.5 — ABNORMAL HIGH (ref 0.8–1.2)
Prothrombin Time: 16.9 seconds — ABNORMAL HIGH (ref 11.4–15.2)
Prothrombin Time: 16.8 s — ABNORMAL HIGH (ref 11.4–15.2)
Prothrombin Time: 18.4 s — ABNORMAL HIGH (ref 11.4–15.2)

## 2022-10-11 LAB — GLUCOSE, CAPILLARY
Glucose-Capillary: 101 mg/dL — ABNORMAL HIGH (ref 70–99)
Glucose-Capillary: 115 mg/dL — ABNORMAL HIGH (ref 70–99)
Glucose-Capillary: 153 mg/dL — ABNORMAL HIGH (ref 70–99)
Glucose-Capillary: 171 mg/dL — ABNORMAL HIGH (ref 70–99)
Glucose-Capillary: 207 mg/dL — ABNORMAL HIGH (ref 70–99)

## 2022-10-11 LAB — BASIC METABOLIC PANEL
Anion gap: 11 (ref 5–15)
Anion gap: 8 (ref 5–15)
BUN: 36 mg/dL — ABNORMAL HIGH (ref 6–20)
BUN: 42 mg/dL — ABNORMAL HIGH (ref 6–20)
CO2: 22 mmol/L (ref 22–32)
CO2: 20 mmol/L — ABNORMAL LOW (ref 22–32)
Calcium: 7.9 mg/dL — ABNORMAL LOW (ref 8.9–10.3)
Calcium: 7.8 mg/dL — ABNORMAL LOW (ref 8.9–10.3)
Chloride: 99 mmol/L (ref 98–111)
Chloride: 105 mmol/L (ref 98–111)
Creatinine, Ser: 1.69 mg/dL — ABNORMAL HIGH (ref 0.44–1.00)
Creatinine, Ser: 1.89 mg/dL — ABNORMAL HIGH (ref 0.44–1.00)
GFR, Estimated: 41 mL/min — ABNORMAL LOW (ref >60)
GFR, Estimated: 36 mL/min — ABNORMAL LOW (ref >60)
Glucose, Bld: 120 mg/dL — ABNORMAL HIGH (ref 70–99)
Glucose, Bld: 188 mg/dL — ABNORMAL HIGH (ref 70–99)
Potassium: 4.1 mmol/L (ref 3.5–5.1)
Potassium: 4 mmol/L (ref 3.5–5.1)
Sodium: 132 mmol/L — ABNORMAL LOW (ref 135–145)
Sodium: 133 mmol/L — ABNORMAL LOW (ref 135–145)

## 2022-10-11 LAB — MAGNESIUM
Magnesium: 1.8 mg/dL (ref 1.7–2.4)
Magnesium: 2.2 mg/dL (ref 1.7–2.4)

## 2022-10-11 LAB — PHOSPHORUS
Phosphorus: 2.9 mg/dL (ref 2.5–4.6)
Phosphorus: 2.9 mg/dL (ref 2.5–4.6)

## 2022-10-11 LAB — CK: Total CK: 13 U/L — ABNORMAL LOW (ref 38–234)

## 2022-10-11 MED ORDER — NOREPINEPHRINE 4 MG/250ML-% IV SOLN
INTRAVENOUS | Status: AC
  Administered 2022-10-11: 4 mg via INTRAVENOUS
  Filled 2022-10-11: qty 250

## 2022-10-11 MED ORDER — NOREPINEPHRINE 4 MG/250ML-% IV SOLN
0.0000 ug/min | INTRAVENOUS | Status: DC
  Administered 2022-10-11: 3 ug/min via INTRAVENOUS
  Administered 2022-10-12: 1 ug/min via INTRAVENOUS
  Administered 2022-10-13 – 2022-10-14 (×2): 2 ug/min via INTRAVENOUS
  Filled 2022-10-11 (×2): qty 250

## 2022-10-11 MED ORDER — SODIUM CHLORIDE 0.9 % IV BOLUS
500.0000 mL | Freq: Once | INTRAVENOUS | Status: AC
  Administered 2022-10-11: 500 mL via INTRAVENOUS

## 2022-10-11 MED ORDER — ORAL CARE MOUTH RINSE: 15.0000 mL | OROMUCOSAL | Status: DC | PRN

## 2022-10-11 MED ORDER — LINEZOLID 600 MG PO TABS
600.0000 mg | ORAL_TABLET | Freq: Two times a day (BID) | ORAL | Status: DC
  Administered 2022-10-11: 600 mg
  Filled 2022-10-11 (×2): qty 1

## 2022-10-11 MED ORDER — ORAL CARE MOUTH RINSE
15.0000 mL | OROMUCOSAL | Status: DC
  Administered 2022-10-11 – 2022-11-07 (×90): 15 mL via OROMUCOSAL

## 2022-10-11 MED ORDER — MAGNESIUM SULFATE 2 GM/50ML IV SOLN
2.0000 g | Freq: Once | INTRAVENOUS | Status: AC
  Administered 2022-10-11: 2 g via INTRAVENOUS
  Filled 2022-10-11: qty 50

## 2022-10-11 MED ORDER — MIDODRINE HCL 5 MG PO TABS
5.0000 mg | ORAL_TABLET | Freq: Three times a day (TID) | ORAL | Status: DC
  Administered 2022-10-11 – 2022-10-13 (×4): 5 mg
  Filled 2022-10-11 (×4): qty 1

## 2022-10-11 MED ORDER — METHOCARBAMOL 500 MG PO TABS
500.0000 mg | ORAL_TABLET | Freq: Four times a day (QID) | ORAL | Status: DC
  Administered 2022-10-11 – 2022-10-12 (×3): 500 mg
  Filled 2022-10-11 (×3): qty 1

## 2022-10-11 MED ORDER — PANTOPRAZOLE SODIUM 40 MG IV SOLR
40.0000 mg | Freq: Two times a day (BID) | INTRAVENOUS | Status: DC
  Administered 2022-10-11 – 2022-10-15 (×8): 40 mg via INTRAVENOUS
  Filled 2022-10-11 (×8): qty 10

## 2022-10-11 NOTE — Progress Notes (Signed)
[facility] ADULT ICU REPLACEMENT PROTOCOL   The patient does apply for the [facility] Adult ICU Electrolyte Replacment Protocol based on the criteria listed below:   1.Exclusion criteria: TCTS, ECMO, Dialysis, and Myasthenia Gravis patients 2. Is GFR >/= 30 ml/min? Yes.    Patient's GFR today is 41 3. Is SCr </= 2? Yes.   Patient's SCr is 1.69 mg/dL 4. Did SCr increase >/= 0.5 in 24 hours? No. 5.Pt's weight >40kg  Yes.   6. Abnormal electrolyte(s): Magnesium 1.8  7. Electrolytes replaced per protocol 8.  Call MD STAT for K+ </= 2.5, Phos </= 1, or Mag </= 1 Physician:  Dr. Namon Cirri A Rozelia Catapano 10/11/2022 5:27 AM

## 2022-10-11 NOTE — Plan of Care (Signed)
Problem: Education: Goal: Knowledge of General Education information will improve Description: Including pain rating scale, medication(s)/side effects and non-pharmacologic comfort measures 10/11/2022 1449 by Landry Dyke, RN Outcome: Progressing 10/11/2022 1449 by Landry Dyke, RN Outcome: Progressing   Problem: Health Behavior/Discharge Planning: Goal: Ability to manage health-related needs will improve 10/11/2022 1449 by Landry Dyke, RN Outcome: Progressing 10/11/2022 1449 by Landry Dyke, RN Outcome: Progressing   Problem: Clinical Measurements: Goal: Ability to maintain clinical measurements within normal limits will improve 10/11/2022 1449 by Landry Dyke, RN Outcome: Progressing 10/11/2022 1449 by Landry Dyke, RN Outcome: Progressing Goal: Will remain free from infection 10/11/2022 1449 by Landry Dyke, RN Outcome: Progressing 10/11/2022 1449 by Landry Dyke, RN Outcome: Progressing Goal: Diagnostic test results will improve 10/11/2022 1449 by Landry Dyke, RN Outcome: Progressing 10/11/2022 1449 by Landry Dyke, RN Outcome: Progressing Goal: Respiratory complications will improve 10/11/2022 1449 by Landry Dyke, RN Outcome: Progressing 10/11/2022 1449 by Landry Dyke, RN Outcome: Progressing Goal: Cardiovascular complication will be avoided 10/11/2022 1449 by Landry Dyke, RN Outcome: Progressing 10/11/2022 1449 by Landry Dyke, RN Outcome: Progressing   Problem: Activity: Goal: Risk for activity intolerance will decrease 10/11/2022 1449 by Landry Dyke, RN Outcome: Progressing 10/11/2022 1449 by Landry Dyke, RN Outcome: Progressing   Problem: Nutrition: Goal: Adequate nutrition will be maintained 10/11/2022 1449 by Landry Dyke, RN Outcome: Progressing 10/11/2022 1449 by Landry Dyke, RN Outcome: Progressing   Problem: Coping: Goal: Level of anxiety will decrease 10/11/2022 1449 by Landry Dyke, RN Outcome: Progressing 10/11/2022 1449 by Landry Dyke, RN Outcome: Progressing   Problem: Elimination: Goal: Will not experience complications related to bowel motility 10/11/2022 1449 by Landry Dyke, RN Outcome: Progressing 10/11/2022 1449 by Landry Dyke, RN Outcome: Progressing Goal: Will not experience complications related to urinary retention 10/11/2022 1449 by Landry Dyke, RN Outcome: Progressing 10/11/2022 1449 by Landry Dyke, RN Outcome: Progressing   Problem: Pain Managment: Goal: General experience of comfort will improve 10/11/2022 1449 by Landry Dyke, RN Outcome: Progressing 10/11/2022 1449 by Landry Dyke, RN Outcome: Progressing   Problem: Safety: Goal: Ability to remain free from injury will improve 10/11/2022 1449 by Landry Dyke, RN Outcome: Progressing 10/11/2022 1449 by Landry Dyke, RN Outcome: Progressing   Problem: Skin Integrity: Goal: Risk for impaired skin integrity will decrease 10/11/2022 1449 by Landry Dyke, RN Outcome: Progressing 10/11/2022 1449 by Landry Dyke, RN Outcome: Progressing   Problem: Education: Goal: Ability to describe self-care measures that may prevent or decrease complications (Diabetes Survival Skills Education) will improve 10/11/2022 1449 by Landry Dyke, RN Outcome: Progressing 10/11/2022 1449 by Landry Dyke, RN Outcome: Progressing Goal: Individualized Educational Video(s) Outcome: Progressing   Problem: Coping: Goal: Ability to adjust to condition or change in health will improve Outcome: Progressing   Problem: Fluid Volume: Goal: Ability to maintain a balanced intake and output will improve Outcome: Progressing   Problem: Health Behavior/Discharge Planning: Goal: Ability to identify and utilize available resources and services will improve Outcome: Progressing Goal: Ability to manage health-related needs will improve Outcome: Progressing    Problem: Metabolic: Goal: Ability to maintain appropriate glucose levels will improve Outcome: Progressing   Problem: Nutritional: Goal: Maintenance of adequate nutrition will improve Outcome: Progressing Goal: Progress toward achieving an optimal weight will improve Outcome: Progressing   Problem: Skin Integrity: Goal: Risk for impaired skin integrity will  decrease Outcome: Progressing   Problem: Tissue Perfusion: Goal: Adequacy of tissue perfusion will improve Outcome: Progressing   Problem: Education: Goal: Knowledge of the prescribed therapeutic regimen will improve Outcome: Progressing Goal: Ability to verbalize activity precautions or restrictions will improve Outcome: Progressing Goal: Understanding of discharge needs will improve Outcome: Progressing   Problem: Activity: Goal: Ability to perform//tolerate increased activity and mobilize with assistive devices will improve Outcome: Progressing   Problem: Clinical Measurements: Goal: Postoperative complications will be avoided or minimized Outcome: Progressing   Problem: Self-Care: Goal: Ability to meet self-care needs will improve Outcome: Progressing   Problem: Self-Concept: Goal: Ability to maintain and perform role responsibilities to the fullest extent possible will improve Outcome: Progressing   Problem: Pain Management: Goal: Pain level will decrease with appropriate interventions Outcome: Progressing   Problem: Skin Integrity: Goal: Demonstration of wound healing without infection will improve Outcome: Progressing   Problem: Activity: Goal: Ability to tolerate increased activity will improve Outcome: Progressing   Problem: Respiratory: Goal: Ability to maintain a clear airway and adequate ventilation will improve Outcome: Progressing   Problem: Role Relationship: Goal: Method of communication will improve Outcome: Progressing

## 2022-10-11 NOTE — Progress Notes (Signed)
NAME:  Kelli Hudson, MRN:  [id number], DOB:  1991/01/01, LOS: 8 ADMISSION DATE:  10/03/2022, CONSULTATION DATE:  10/08/22 REFERRING MD:  Lajoyce Corners, CHIEF COMPLAINT: nec fasc   History of Present Illness:  [name] PMH Dm1 diabetic gastroparesis now s/p gastric pacemaker, HTN admitted 8/17 with leg swelling -- BLE foot wounds, which  were being followed outpt by podiatry. Admitted to [facility] for sepsis in this setting, OR with podiatry 8/18 for I&D BLE foot wounds.   ID was consulted 8/20 for ongoing fever. Concern for nec fasc 8/21 ortho consulted -- transferred to [facility], underwent excision and debridement of BLE --extending feet to thighs. EBL from this case is not recorded but pt did receive 4 PRBC 2 FFP as well as crystalloid + colloid, as well as pressors for hemodynamic support .   Remains intubated after case. PCCM consulted for ICU management.   Pertinent  Medical History  DM1 Diabetic gastroparesis S/p gastric pacemaker  HTN  Significant Hospital Events: Including procedures, antibiotic start and stop dates in addition to other pertinent events   8/17 admit to [facility] sepsis BLE wound infections. Podiatry consult 8/18 I&D with podiatry.  8/20 ID consult  8/21 ortho consult. OR for excision and debridement of BLE nec fasc-- feet to thighs. Remained intubated, PCCM consulted.  4 PRBC 2 FFP during case. 8/23-extubated  Interim History / Subjective:  Admits to pain, discomfort Not able to localize pain Requiring opiates Fever 101.7  Objective   Blood pressure 101/62, pulse (!) 115, temperature (!) 101.7 F (38.7 C), resp. rate 17, height 5\' 7"  (1.702 m), weight 103.1 kg, SpO2 100%.        Intake/Output Summary (Last 24 hours) at 10/11/2022 0934 Last data filed at 10/11/2022 0900 Gross per 24 hour  Intake 3421.82 ml  Output 4555 ml  Net -1133.18 ml   Filed Weights   10/08/22 1344 10/10/22 0500 10/11/22 0400  Weight: 84.4 kg 106.7 kg 103.1 kg    Examination: General: Acute  on chronically ill-appearing HEENT: Moist oral mucosa, nasogastric tube in place Neuro: Awake, alert, interactive CV: S1-S2 appreciated PULM: Clear breath sounds GI: Bowel sounds appreciated Extremities: Wound vacs, bilateral lower extremities,  Skin: Warm, dry  I reviewed nursing notes, Consultant notes, last 24 h vitals and pain scores, last 48 h intake and output, last 24 h labs and trends, and last 24 h imaging results.  Resolved Hospital Problem list     Assessment & Plan:   Bilateral feet abscess, necrotizing fasciitis S/p bilateral excisional leg and thigh debridement Severe sepsis -Orthopedics continues to follow -Started on pressors during the night -Continue to follow cultures-last blood culture 10/07/2022-no growth -Wound culture rare Staph aureus, rare Pseudomonas aeruginosa -Continue aggressive care -Add midodrine continue  Acute blood loss anemia due to operative blood loss -Will transfuse per protocol -Continue to monitor  Type 1 diabetes Uncontrolled diabetes -Goal blood sugar 140-180 -Continue Levemir  Protein calorie malnutrition -Continue tube feeds  History of gastroparesis Status post gastric pacemaker placement -Continue Reglan  Hypertension Hyperlipidemia -Hold antihypertensive medications  Leukocytosis -Will continue current antibiotics  Remains critically ill  Best Practice (right click and "Reselect all SmartList Selections" daily)   Diet/type: tubefeeds DVT prophylaxis: other; given ABLA post op will hold for now; no scds given wound vac  GI prophylaxis: H2B Lines: Central line and Arterial Line Foley:  N/A Code Status:  full code Last date of multidisciplinary goals of care discussion [pending]  Labs   CBC: Recent Labs  Lab 10/09/22 1802 10/10/22 0236 10/10/22 1116 10/10/22 1711 10/11/22 0259  WBC 34.5* 30.2* 31.5* 33.0* 40.6*  HGB 9.4* 9.8* 10.5* 10.6* 11.0*  HCT 27.7* 29.4* 32.2* 31.3* 33.2*  MCV 84.2 82.8 84.3 85.8  85.8  PLT 339 349 370 356 364    Basic Metabolic Panel: Recent Labs  Lab 10/09/22 0329 10/09/22 0900 10/09/22 1802 10/10/22 0236 10/10/22 1711 10/11/22 0259  NA 131*  131*  --  131* 131* 133* 132*  K 4.4  4.3  --  3.7 3.5 4.1 4.1  CL 100  101  --  102 101 106 99  CO2 20*  19*  --  22 23 23 22   GLUCOSE 213*  211*  --  141* 111* 165* 120*  BUN 24*  24*  --  27* 30* 33* 36*  CREATININE 1.99*  1.90*  --  2.04* 1.93* 1.75* 1.69*  CALCIUM 7.8*  7.7*  --  7.1* 7.4* 7.7* 7.9*  MG  --  1.9 1.8 1.9 1.8 1.8  PHOS  --  4.0 3.2 2.7 3.6 2.9   GFR: Estimated Creatinine Clearance: 59.5 mL/min (A) (by C-G formula based on SCr of 1.69 mg/dL (H)). Recent Labs  Lab 10/07/22 0822 10/08/22 0442 10/10/22 0236 10/10/22 1116 10/10/22 1711 10/11/22 0259  WBC  --    < > 30.2* 31.5* 33.0* 40.6*  LATICACIDVEN 0.8  --   --   --   --   --    < > = values in this interval not displayed.     ABG    Component Value Date/Time   PHART 7.362 10/08/2022 2001   PCO2ART 30.0 (L) 10/08/2022 2001   PO2ART 189 (H) 10/08/2022 2001   HCO3 17.3 (L) 10/08/2022 2001   TCO2 18 (L) 10/08/2022 2001   ACIDBASEDEF 8.0 (H) 10/08/2022 2001   O2SAT 100 10/08/2022 2001    The patient is critically ill with multiple organ systems failure and requires high complexity decision making for assessment and support, frequent evaluation and titration of therapies, application of advanced monitoring technologies and extensive interpretation of multiple databases. Critical Care Time devoted to patient care services described in this note independent of APP/resident time (if applicable)  is 31 minutes.   Virl Diamond MD  Pulmonary Critical Care Personal pager: See Amion If unanswered, please page CCM On-call: #[phone]

## 2022-10-11 NOTE — Progress Notes (Signed)
Orthopaedic Trauma Service (OTS)  3 Days Post-Op Procedure(s) (LRB): BILATERAL LEG DEBRIDEMENT (Bilateral)  Subjective: Patient reports pain as  intermittently severe especially in the right thigh .    Objective: Current Vitals Blood pressure 101/62, pulse (!) 115, temperature (!) 101.7 F (38.7 C), resp. rate 17, height 5\' 7"  (1.702 m), weight 103.1 kg, SpO2 100%. Vital signs in last 24 hours: Temp:  [99 F (37.2 C)-101.7 F (38.7 C)] 101.7 F (38.7 C) (08/24 0900) Pulse Rate:  [99-116] 115 (08/24 0900) Resp:  [9-22] 17 (08/24 0900) BP: (101-108)/(62-66) 101/62 (08/24 0900) SpO2:  [91 %-100 %] 100 % (08/24 0900) Arterial Line BP: (90-185)/(41-81) 122/52 (08/24 0900) Weight:  [103.1 kg] 103.1 kg (08/24 0400)  Intake/Output from previous day: 08/23 0701 - 08/24 0700 In: 2826.2 [I.V.:771.6; NG/GT:958.2; IV Piggyback:1096.5] Out: 4595 [Urine:1470; Drains:3125]  LABS Recent Labs    10/10/22 0236 10/10/22 1116 10/10/22 1711 10/11/22 0259 10/11/22 0934  HGB 9.8* 10.5* 10.6* 11.0* 10.6*   Recent Labs    10/11/22 0259 10/11/22 0934  WBC 40.6* 42.3*  RBC 3.87 3.85*  HCT 33.2* 32.6*  PLT 364 369   Recent Labs    10/10/22 1711 10/11/22 0259  NA 133* 132*  K 4.1 4.1  CL 106 99  CO2 23 22  BUN 33* 36*  CREATININE 1.75* 1.69*  GLUCOSE 165* 120*  CALCIUM 7.7* 7.9*   Recent Labs    10/11/22 0259 10/11/22 0934  INR 1.4* 1.3*     Physical Exam RLE Vac dressing intact, clean, dry  Edema/ swelling moderately severe No significant erythema; warmth is noted Tender right thigh, no fluctuance   Brisk cap refill, warm to touch LLE Vac dressing intact, clean, dry  Edema/ swelling moderately severe No significant erythema; warmth is noted Tender right thigh  Brisk cap refill, warm to touch Large foot dressings in place bilaterally with Vashe solution Canisters have clear serous fluid; slightly less volume over the last three shifts   Assessment/Plan: 3  Days Post-Op Procedure(s) (LRB): BILATERAL LEG DEBRIDEMENT (Bilateral) 1. On Linezolid and Cefepime; Flagyl added last night 2. DVT proph Heparin 3. OR tomorrow for vac changes bilateral LEx  Attempted to contact mother for consent but no answer. Will retry.  Myrene Galas, MD [facility], [facility] [phone]

## 2022-10-11 NOTE — Progress Notes (Signed)
eLink Physician-Brief Progress Note Patient Name: Kelli Hudson DOB: 01/06/1991 MRN: [id number]   Date of Service  10/11/2022  HPI/Events of Note  BP soft. 75/41, MAP 49, Hemoglobin 11.1 gm / dl.  eICU Interventions  NS 500 ml IV bolus x 1 ordered, Levophed gtt started.        Migdalia Dk 10/11/2022, 6:48 AM

## 2022-10-12 ENCOUNTER — Encounter (HOSPITAL_COMMUNITY)
Admission: EM | Disposition: A | Discharge status: Skilled Nursing Facility | Payer: Self-pay | Source: Home / Self Care | Attending: Internal Medicine

## 2022-10-12 ENCOUNTER — Inpatient Hospital Stay (HOSPITAL_COMMUNITY): Payer: Medicaid Other | Admitting: Certified Registered"

## 2022-10-12 DIAGNOSIS — A419 Sepsis, unspecified organism: Secondary | ICD-10-CM | POA: Diagnosis not present

## 2022-10-12 DIAGNOSIS — M726 Necrotizing fasciitis: Secondary | ICD-10-CM

## 2022-10-12 DIAGNOSIS — F418 Other specified anxiety disorders: Secondary | ICD-10-CM

## 2022-10-12 DIAGNOSIS — E1065 Type 1 diabetes mellitus with hyperglycemia: Secondary | ICD-10-CM | POA: Diagnosis not present

## 2022-10-12 DIAGNOSIS — I10 Essential (primary) hypertension: Secondary | ICD-10-CM | POA: Diagnosis not present

## 2022-10-12 DIAGNOSIS — L0390 Cellulitis, unspecified: Secondary | ICD-10-CM | POA: Diagnosis not present

## 2022-10-12 HISTORY — PX: I & D EXTREMITY: SHX5045

## 2022-10-12 HISTORY — PX: APPLICATION OF WOUND VAC: SHX5189

## 2022-10-12 LAB — CBC
HCT: 32.7 % — ABNORMAL LOW (ref 36.0–46.0)
HCT: 30.8 % — ABNORMAL LOW (ref 36.0–46.0)
HCT: 28.5 % — ABNORMAL LOW (ref 36.0–46.0)
Hemoglobin: 10.6 g/dL — ABNORMAL LOW (ref 12.0–15.0)
Hemoglobin: 10.1 g/dL — ABNORMAL LOW (ref 12.0–15.0)
Hemoglobin: 9.2 g/dL — ABNORMAL LOW (ref 12.0–15.0)
MCH: 27.6 pg (ref 26.0–34.0)
MCH: 28.9 pg (ref 26.0–34.0)
MCH: 27.9 pg (ref 26.0–34.0)
MCHC: 32.4 g/dL (ref 30.0–36.0)
MCHC: 32.8 g/dL (ref 30.0–36.0)
MCHC: 32.3 g/dL (ref 30.0–36.0)
MCV: 85.2 fL (ref 80.0–100.0)
MCV: 88.3 fL (ref 80.0–100.0)
MCV: 86.4 fL (ref 80.0–100.0)
Platelets: 349 10*3/uL (ref 150–400)
Platelets: 336 10*3/uL (ref 150–400)
Platelets: 327 10*3/uL (ref 150–400)
RBC: 3.84 MIL/uL — ABNORMAL LOW (ref 3.87–5.11)
RBC: 3.49 MIL/uL — ABNORMAL LOW (ref 3.87–5.11)
RBC: 3.3 MIL/uL — ABNORMAL LOW (ref 3.87–5.11)
RDW: 17.1 % — ABNORMAL HIGH (ref 11.5–15.5)
RDW: 17.2 % — ABNORMAL HIGH (ref 11.5–15.5)
RDW: 17.1 % — ABNORMAL HIGH (ref 11.5–15.5)
WBC: 42.1 10*3/uL — ABNORMAL HIGH (ref 4.0–10.5)
WBC: 46.4 10*3/uL — ABNORMAL HIGH (ref 4.0–10.5)
WBC: 42.8 10*3/uL — ABNORMAL HIGH (ref 4.0–10.5)
nRBC: 0 % (ref 0.0–0.2)
nRBC: 0 % (ref 0.0–0.2)
nRBC: 0 % (ref 0.0–0.2)

## 2022-10-12 LAB — PROTIME-INR
INR: 1.5 — ABNORMAL HIGH (ref 0.8–1.2)
INR: 1.6 — ABNORMAL HIGH (ref 0.8–1.2)
Prothrombin Time: 18.5 s — ABNORMAL HIGH (ref 11.4–15.2)
Prothrombin Time: 18.9 seconds — ABNORMAL HIGH (ref 11.4–15.2)

## 2022-10-12 LAB — BASIC METABOLIC PANEL
Anion gap: 8 (ref 5–15)
Anion gap: 9 (ref 5–15)
BUN: 43 mg/dL — ABNORMAL HIGH (ref 6–20)
BUN: 48 mg/dL — ABNORMAL HIGH (ref 6–20)
CO2: 20 mmol/L — ABNORMAL LOW (ref 22–32)
CO2: 21 mmol/L — ABNORMAL LOW (ref 22–32)
Calcium: 7.9 mg/dL — ABNORMAL LOW (ref 8.9–10.3)
Calcium: 7.8 mg/dL — ABNORMAL LOW (ref 8.9–10.3)
Chloride: 106 mmol/L (ref 98–111)
Chloride: 104 mmol/L (ref 98–111)
Creatinine, Ser: 2.19 mg/dL — ABNORMAL HIGH (ref 0.44–1.00)
Creatinine, Ser: 1.96 mg/dL — ABNORMAL HIGH (ref 0.44–1.00)
GFR, Estimated: 30 mL/min — ABNORMAL LOW (ref >60)
GFR, Estimated: 34 mL/min — ABNORMAL LOW (ref >60)
Glucose, Bld: 198 mg/dL — ABNORMAL HIGH (ref 70–99)
Glucose, Bld: 132 mg/dL — ABNORMAL HIGH (ref 70–99)
Potassium: 4.5 mmol/L (ref 3.5–5.1)
Potassium: 4.3 mmol/L (ref 3.5–5.1)
Sodium: 134 mmol/L — ABNORMAL LOW (ref 135–145)
Sodium: 134 mmol/L — ABNORMAL LOW (ref 135–145)

## 2022-10-12 LAB — PHOSPHORUS
Phosphorus: 3.1 mg/dL (ref 2.5–4.6)
Phosphorus: 3.3 mg/dL (ref 2.5–4.6)

## 2022-10-12 LAB — GLUCOSE, CAPILLARY
Glucose-Capillary: 167 mg/dL — ABNORMAL HIGH (ref 70–99)
Glucose-Capillary: 176 mg/dL — ABNORMAL HIGH (ref 70–99)
Glucose-Capillary: 144 mg/dL — ABNORMAL HIGH (ref 70–99)
Glucose-Capillary: 117 mg/dL — ABNORMAL HIGH (ref 70–99)
Glucose-Capillary: 128 mg/dL — ABNORMAL HIGH (ref 70–99)
Glucose-Capillary: 166 mg/dL — ABNORMAL HIGH (ref 70–99)

## 2022-10-12 LAB — MAGNESIUM
Magnesium: 2.2 mg/dL (ref 1.7–2.4)
Magnesium: 2.2 mg/dL (ref 1.7–2.4)

## 2022-10-12 LAB — VITAMIN C: Vitamin C: 0.6 mg/dL (ref 0.4–2.0)

## 2022-10-12 LAB — TRIGLYCERIDES: Triglycerides: 63 mg/dL (ref <150)

## 2022-10-12 SURGERY — IRRIGATION AND DEBRIDEMENT EXTREMITY
Anesthesia: General | Laterality: Bilateral

## 2022-10-12 MED ORDER — SUCCINYLCHOLINE CHLORIDE 200 MG/10ML IV SOSY
PREFILLED_SYRINGE | INTRAVENOUS | Status: DC | PRN
  Administered 2022-10-12: 120 mg via INTRAVENOUS

## 2022-10-12 MED ORDER — FENTANYL CITRATE (PF) 250 MCG/5ML IJ SOLN
INTRAMUSCULAR | Status: DC | PRN
  Administered 2022-10-12: 50 ug via INTRAVENOUS
  Administered 2022-10-12: 100 ug via INTRAVENOUS
  Administered 2022-10-12: 50 ug via INTRAVENOUS

## 2022-10-12 MED ORDER — SODIUM CHLORIDE 0.9 % IV SOLN: INTRAVENOUS | Status: DC | PRN

## 2022-10-12 MED ORDER — SODIUM CHLORIDE 0.9 % IR SOLN
Status: DC | PRN
  Administered 2022-10-12: 6000 mL

## 2022-10-12 MED ORDER — FENTANYL CITRATE (PF) 100 MCG/2ML IJ SOLN: 25.0000 ug | INTRAMUSCULAR | Status: DC | PRN

## 2022-10-12 MED ORDER — ALBUMIN HUMAN 5 % IV SOLN: INTRAVENOUS | Status: DC | PRN

## 2022-10-12 MED ORDER — SUGAMMADEX SODIUM 200 MG/2ML IV SOLN
INTRAVENOUS | Status: DC | PRN
  Administered 2022-10-12: 200 mg via INTRAVENOUS

## 2022-10-12 MED ORDER — OXYCODONE HCL 5 MG PO TABS
5.0000 mg | ORAL_TABLET | ORAL | Status: DC | PRN
  Administered 2022-10-12: 10 mg
  Administered 2022-10-13: 5 mg
  Administered 2022-10-16 – 2022-10-22 (×10): 10 mg
  Administered 2022-10-22: 5 mg
  Administered 2022-10-22 – 2022-10-25 (×4): 10 mg
  Filled 2022-10-12 (×4): qty 2
  Filled 2022-10-12: qty 1
  Filled 2022-10-12 (×9): qty 2
  Filled 2022-10-12: qty 1
  Filled 2022-10-12 (×3): qty 2

## 2022-10-12 MED ORDER — PROMETHAZINE HCL 25 MG/ML IJ SOLN: 6.2500 mg | INTRAMUSCULAR | Status: DC | PRN

## 2022-10-12 MED ORDER — LIDOCAINE 2% (20 MG/ML) 5 ML SYRINGE
INTRAMUSCULAR | Status: AC
  Filled 2022-10-12: qty 5

## 2022-10-12 MED ORDER — MIDAZOLAM HCL 2 MG/2ML IJ SOLN
INTRAMUSCULAR | Status: DC | PRN
  Administered 2022-10-12: 2 mg via INTRAVENOUS

## 2022-10-12 MED ORDER — VASOPRESSIN 20 UNIT/ML IV SOLN
INTRAVENOUS | Status: AC
  Filled 2022-10-12: qty 1

## 2022-10-12 MED ORDER — LABETALOL HCL 5 MG/ML IV SOLN
10.0000 mg | INTRAVENOUS | Status: DC | PRN
  Administered 2022-10-12: 10 mg via INTRAVENOUS

## 2022-10-12 MED ORDER — FENTANYL CITRATE (PF) 100 MCG/2ML IJ SOLN
25.0000 ug | INTRAMUSCULAR | Status: DC | PRN
  Administered 2022-10-12: 50 ug via INTRAVENOUS

## 2022-10-12 MED ORDER — ONDANSETRON HCL 4 MG/2ML IJ SOLN
INTRAMUSCULAR | Status: DC | PRN
  Administered 2022-10-12: 4 mg via INTRAVENOUS

## 2022-10-12 MED ORDER — PROPOFOL 10 MG/ML IV BOLUS
INTRAVENOUS | Status: AC
  Filled 2022-10-12: qty 20

## 2022-10-12 MED ORDER — ONDANSETRON HCL 4 MG/2ML IJ SOLN
INTRAMUSCULAR | Status: AC
  Filled 2022-10-12: qty 2

## 2022-10-12 MED ORDER — METHOCARBAMOL 1000 MG/10ML IJ SOLN
1000.0000 mg | Freq: Three times a day (TID) | INTRAVENOUS | Status: DC
  Administered 2022-10-12 – 2022-10-15 (×9): 1000 mg via INTRAVENOUS
  Filled 2022-10-12: qty 1000
  Filled 2022-10-12: qty 10
  Filled 2022-10-12: qty 1000
  Filled 2022-10-12 (×2): qty 10
  Filled 2022-10-12 (×2): qty 1000
  Filled 2022-10-12: qty 10
  Filled 2022-10-12: qty 1000
  Filled 2022-10-12 (×2): qty 10

## 2022-10-12 MED ORDER — LINEZOLID 600 MG PO TABS
600.0000 mg | ORAL_TABLET | Freq: Two times a day (BID) | ORAL | Status: DC
  Administered 2022-10-12 – 2022-10-15 (×7): 600 mg
  Filled 2022-10-12 (×9): qty 1

## 2022-10-12 MED ORDER — ACETAMINOPHEN 500 MG PO TABS
1000.0000 mg | ORAL_TABLET | Freq: Three times a day (TID) | ORAL | Status: DC
  Administered 2022-10-12 – 2022-10-13 (×2): 1000 mg via ORAL
  Filled 2022-10-12 (×2): qty 2

## 2022-10-12 MED ORDER — LACTATED RINGERS IV SOLN: INTRAVENOUS | Status: DC | PRN

## 2022-10-12 MED ORDER — FENTANYL CITRATE (PF) 250 MCG/5ML IJ SOLN
INTRAMUSCULAR | Status: AC
  Filled 2022-10-12: qty 5

## 2022-10-12 MED ORDER — FENTANYL CITRATE PF 50 MCG/ML IJ SOSY
25.0000 ug | PREFILLED_SYRINGE | INTRAMUSCULAR | Status: DC | PRN
  Administered 2022-10-12 – 2022-10-14 (×9): 50 ug via INTRAVENOUS
  Filled 2022-10-12 (×9): qty 1

## 2022-10-12 MED ORDER — FENTANYL CITRATE (PF) 100 MCG/2ML IJ SOLN
INTRAMUSCULAR | Status: AC
  Filled 2022-10-12: qty 2

## 2022-10-12 MED ORDER — ROCURONIUM BROMIDE 10 MG/ML (PF) SYRINGE
PREFILLED_SYRINGE | INTRAVENOUS | Status: DC | PRN
  Administered 2022-10-12: 50 mg via INTRAVENOUS

## 2022-10-12 MED ORDER — PROPOFOL 10 MG/ML IV BOLUS
INTRAVENOUS | Status: DC | PRN
Start: 2022-10-12 — End: 2022-10-12
  Administered 2022-10-12: 150 mg via INTRAVENOUS

## 2022-10-12 MED ORDER — LABETALOL HCL 5 MG/ML IV SOLN
INTRAVENOUS | Status: AC
  Filled 2022-10-12: qty 4

## 2022-10-12 MED ORDER — VASHE WOUND IRRIGATION OPTIME
TOPICAL | Status: DC | PRN
  Administered 2022-10-12: 8.5 [oz_av]

## 2022-10-12 MED ORDER — MIDAZOLAM HCL 2 MG/2ML IJ SOLN
INTRAMUSCULAR | Status: AC
  Filled 2022-10-12: qty 2

## 2022-10-12 MED ORDER — 0.9 % SODIUM CHLORIDE (POUR BTL) OPTIME
TOPICAL | Status: DC | PRN
  Administered 2022-10-12: 1000 mL

## 2022-10-12 MED ORDER — ROCURONIUM BROMIDE 10 MG/ML (PF) SYRINGE
PREFILLED_SYRINGE | INTRAVENOUS | Status: AC
  Filled 2022-10-12: qty 10

## 2022-10-12 MED ORDER — LIDOCAINE 2% (20 MG/ML) 5 ML SYRINGE
INTRAMUSCULAR | Status: DC | PRN
  Administered 2022-10-12: 60 mg via INTRAVENOUS

## 2022-10-12 SURGICAL SUPPLY — 63 items
BAG COUNTER SPONGE SURGICOUNT (BAG) IMPLANT
BAG ISL DRAPE 18X18 STRL (DRAPES)
BAG ISOLATION DRAPE 18X18 (DRAPES) IMPLANT
BAG SPNG CNTER NS LX DISP (BAG)
BNDG CMPR 75X21 PLY HI ABS (MISCELLANEOUS) ×4
BNDG COHESIVE 4X5 TAN STRL (GAUZE/BANDAGES/DRESSINGS) ×1 IMPLANT
BNDG GAUZE DERMACEA FLUFF 4 (GAUZE/BANDAGES/DRESSINGS) ×2 IMPLANT
BNDG GZE DERMACEA 4 6PLY (GAUZE/BANDAGES/DRESSINGS)
BRUSH SCRUB EZ PLAIN DRY (MISCELLANEOUS) ×2 IMPLANT
CANISTER WOUNDNEG PRESSURE 500 (CANNISTER) IMPLANT
CONNECTOR Y ATS VAC SYSTEM (MISCELLANEOUS) IMPLANT
COVER SURGICAL LIGHT HANDLE (MISCELLANEOUS) ×2 IMPLANT
DRAPE IMP U-DRAPE 54X76 (DRAPES) IMPLANT
DRAPE ORTHO SPLIT 77X108 STRL (DRAPES) ×4
DRAPE PERI GROIN 82X75IN TIB (DRAPES) IMPLANT
DRAPE SURG ORHT 6 SPLT 77X108 (DRAPES) IMPLANT
DRAPE U-SHAPE 47X51 STRL (DRAPES) ×1 IMPLANT
DRESSING VERAFLO CLEANS CC MED (GAUZE/BANDAGES/DRESSINGS) IMPLANT
DRSG ADAPTIC 3X8 NADH LF (GAUZE/BANDAGES/DRESSINGS) ×1 IMPLANT
DRSG DERMACEA NONADH 3X8 (GAUZE/BANDAGES/DRESSINGS) IMPLANT
DRSG TELFA 3X8 NADH STRL (GAUZE/BANDAGES/DRESSINGS) IMPLANT
DRSG VERAFLO CLEANSE CC MED (GAUZE/BANDAGES/DRESSINGS) ×4
ELECT REM PT RETURN 9FT ADLT (ELECTROSURGICAL) ×1
ELECTRODE REM PT RTRN 9FT ADLT (ELECTROSURGICAL) IMPLANT
GAUZE PAD ABD 8X10 STRL (GAUZE/BANDAGES/DRESSINGS) IMPLANT
GAUZE SPONGE 4X4 12PLY STRL (GAUZE/BANDAGES/DRESSINGS) ×1 IMPLANT
GAUZE STRETCH 2X75IN STRL (MISCELLANEOUS) IMPLANT
GLOVE BIO SURGEON STRL SZ7.5 (GLOVE) ×1 IMPLANT
GLOVE BIO SURGEON STRL SZ8 (GLOVE) ×1 IMPLANT
GLOVE BIOGEL PI IND STRL 7.5 (GLOVE) ×1 IMPLANT
GLOVE BIOGEL PI IND STRL 8 (GLOVE) ×1 IMPLANT
GLOVE BIOGEL PI IND STRL 9 (GLOVE) ×1 IMPLANT
GLOVE SURG ORTHO LTX SZ7.5 (GLOVE) ×2 IMPLANT
GOWN STRL REUS W/ TWL LRG LVL3 (GOWN DISPOSABLE) ×2 IMPLANT
GOWN STRL REUS W/ TWL XL LVL3 (GOWN DISPOSABLE) ×1 IMPLANT
GOWN STRL REUS W/TWL LRG LVL3 (GOWN DISPOSABLE) ×4
GOWN STRL REUS W/TWL XL LVL3 (GOWN DISPOSABLE) ×1
HANDPIECE INTERPULSE COAX TIP (DISPOSABLE)
KIT BASIN OR (CUSTOM PROCEDURE TRAY) ×1 IMPLANT
KIT TURNOVER KIT B (KITS) ×1 IMPLANT
MANIFOLD NEPTUNE II (INSTRUMENTS) ×1 IMPLANT
NS IRRIG 1000ML POUR BTL (IV SOLUTION) ×1 IMPLANT
PACK ORTHO EXTREMITY (CUSTOM PROCEDURE TRAY) ×1 IMPLANT
PAD ARMBOARD 7.5X6 YLW CONV (MISCELLANEOUS) ×2 IMPLANT
PAD NEG PRESSURE SENSATRAC (MISCELLANEOUS) IMPLANT
PADDING CAST COTTON 6X4 STRL (CAST SUPPLIES) ×1 IMPLANT
SET CYSTO W/LG BORE CLAMP LF (SET/KITS/TRAYS/PACK) IMPLANT
SET HNDPC FAN SPRY TIP SCT (DISPOSABLE) IMPLANT
SOL PREP POV-IOD 4OZ 10% (MISCELLANEOUS) ×1 IMPLANT
SOL SCRUB PVP POV-IOD 4OZ 7.5% (MISCELLANEOUS) ×2
SOLUTION SCRB POV-IOD 4OZ 7.5% (MISCELLANEOUS) ×1 IMPLANT
SPONGE T-LAP 18X18 ~~LOC~~+RFID (SPONGE) ×1 IMPLANT
STOCKINETTE IMPERVIOUS 9X36 MD (GAUZE/BANDAGES/DRESSINGS) IMPLANT
STOCKINETTE IMPERVIOUS LG (DRAPES) IMPLANT
SUT ETHILON 2 0 PSLX (SUTURE) IMPLANT
SUT ETHILON O TP 1 (SUTURE) IMPLANT
SUT PDS AB 2-0 CT1 27 (SUTURE) IMPLANT
TOWEL GREEN STERILE (TOWEL DISPOSABLE) ×2 IMPLANT
TOWEL GREEN STERILE FF (TOWEL DISPOSABLE) ×1 IMPLANT
TUBE CONNECTING 12X1/4 (SUCTIONS) ×1 IMPLANT
UNDERPAD 30X36 HEAVY ABSORB (UNDERPADS AND DIAPERS) ×1 IMPLANT
WATER STERILE IRR 1000ML POUR (IV SOLUTION) ×1 IMPLANT
YANKAUER SUCT BULB TIP NO VENT (SUCTIONS) ×1 IMPLANT

## 2022-10-12 NOTE — Progress Notes (Signed)
NAME:  Kelli Hudson, MRN:  [id number], DOB:  06/16/1990, LOS: 9 ADMISSION DATE:  10/03/2022, CONSULTATION DATE:  10/08/22 REFERRING MD:  Lajoyce Corners, CHIEF COMPLAINT: nec fasc   History of Present Illness:  [name] PMH Dm1 diabetic gastroparesis now s/p gastric pacemaker, HTN admitted 8/17 with leg swelling -- BLE foot wounds, which  were being followed outpt by podiatry. Admitted to [facility] for sepsis in this setting, OR with podiatry 8/18 for I&D BLE foot wounds.   ID was consulted 8/20 for ongoing fever. Concern for nec fasc 8/21 ortho consulted -- transferred to [facility], underwent excision and debridement of BLE --extending feet to thighs. EBL from this case is not recorded but pt did receive 4 PRBC 2 FFP as well as crystalloid + colloid, as well as pressors for hemodynamic support .   Remained intubated after case. PCCM consulted for ICU management.  Extubated 8/23  Pertinent  Medical History  DM1 Diabetic gastroparesis S/p gastric pacemaker  HTN  Significant Hospital Events: Including procedures, antibiotic start and stop dates in addition to other pertinent events   8/17 admit to [facility] sepsis BLE wound infections. Podiatry consult 8/18 I&D with podiatry.  8/20 ID consult  8/21 ortho consult. OR for excision and debridement of BLE nec fasc-- feet to thighs. Remained intubated, PCCM consulted.  4 PRBC 2 FFP during case. 8/23-extubated 8/25 for OR today  Interim History / Subjective:  Tmax 101.5 Comfortable No overnight events  Objective   Blood pressure 101/62, pulse 94, temperature 99.5 F (37.5 C), resp. rate 13, height 5\' 7"  (1.702 m), weight 102.7 kg, SpO2 96%.        Intake/Output Summary (Last 24 hours) at 10/12/2022 0859 Last data filed at 10/12/2022 0800 Gross per 24 hour  Intake 2223.25 ml  Output 2655 ml  Net -431.75 ml   Filed Weights   10/10/22 0500 10/11/22 0400 10/12/22 0500  Weight: 106.7 kg 103.1 kg 102.7 kg    Examination: General: Acute on chronically  ill-appearing HEENT: Moist oral mucosa, nasogastric tube in place Neuro: Awake, alert and interactive CV: S1-S2 appreciated PULM: Clear breath sounds GI: Bowel sounds appreciated Extremities: Wound vacs in place Skin: Warm, dry  I reviewed nursing notes, Consultant notes, last 24 h vitals and pain scores, last 48 h intake and output, last 24 h labs and trends, and last 24 h imaging results.  Resolved Hospital Problem list     Assessment & Plan:   Bilateral feet abscess Necrotizing fasciitis S/p bilateral excisional leg and thigh debridement Severe sepsis -Back to the OR today -On Levophed -Wound cultures with MSSA and Pseudomonas -Blood cultures no growth  Acute blood loss anemia due to operative blood loss -Will transfuse per protocol -Continue to monitor  Type 1 diabetes -Uncontrolled diabetes -Goal blood sugar 140-180 -Continue Levemir  Protein calorie malnutrition -Continue tube feeds  History of gastroparesis -S/p gastric pacemaker placement -Continue Reglan  Leukocytosis -This is stable  Diarrhea -Will continue to monitor -Will adjust laxative meds   Best Practice (right click and "Reselect all SmartList Selections" daily)   Diet/type: tubefeeds DVT prophylaxis: other; given ABLA post op will hold for now; no scds given wound vac  GI prophylaxis: H2B Lines: Central line and Arterial Line Foley:  N/A Code Status:  full code Last date of multidisciplinary goals of care discussion [pending]  Labs   CBC: Recent Labs  Lab 10/10/22 1116 10/10/22 1711 10/11/22 0259 10/11/22 0934 10/12/22 0223  WBC 31.5* 33.0* 40.6* 42.3* 42.1*  HGB 10.5* 10.6* 11.0* 10.6* 10.6*  HCT 32.2* 31.3* 33.2* 32.6* 32.7*  MCV 84.3 85.8 85.8 84.7 85.2  PLT 370 356 364 369 349    Basic Metabolic Panel: Recent Labs  Lab 10/10/22 0236 10/10/22 1711 10/11/22 0259 10/11/22 1718 10/12/22 0223  NA 131* 133* 132* 133* 134*  K 3.5 4.1 4.1 4.0 4.5  CL 101 106 99 105 106   CO2 23 23 22  20* 20*  GLUCOSE 111* 165* 120* 188* 198*  BUN 30* 33* 36* 42* 43*  CREATININE 1.93* 1.75* 1.69* 1.89* 2.19*  CALCIUM 7.4* 7.7* 7.9* 7.8* 7.9*  MG 1.9 1.8 1.8 2.2 2.2  PHOS 2.7 3.6 2.9 2.9 3.1   GFR: Estimated Creatinine Clearance: 45.8 mL/min (A) (by C-G formula based on SCr of 2.19 mg/dL (H)). Recent Labs  Lab 10/07/22 0822 10/08/22 0442 10/10/22 1711 10/11/22 0259 10/11/22 0934 10/12/22 0223  WBC  --    < > 33.0* 40.6* 42.3* 42.1*  LATICACIDVEN 0.8  --   --   --   --   --    < > = values in this interval not displayed.     ABG    Component Value Date/Time   PHART 7.362 10/08/2022 2001   PCO2ART 30.0 (L) 10/08/2022 2001   PO2ART 189 (H) 10/08/2022 2001   HCO3 17.3 (L) 10/08/2022 2001   TCO2 18 (L) 10/08/2022 2001   ACIDBASEDEF 8.0 (H) 10/08/2022 2001   O2SAT 100 10/08/2022 2001    The patient is critically ill with multiple organ systems failure and requires high complexity decision making for assessment and support, frequent evaluation and titration of therapies, application of advanced monitoring technologies and extensive interpretation of multiple databases. Critical Care Time devoted to patient care services described in this note independent of APP/resident time (if applicable)  is 33 minutes.   Virl Diamond MD [facility] Personal pager: See Amion If unanswered, please page CCM On-call: #[phone]

## 2022-10-12 NOTE — Progress Notes (Signed)
No significant changes overnight. Spoke with Mother at length yesterday regarding operative plan for today, current assessment, and plans for later this week with Dr. Lajoyce Corners.  I confirmed with her this am as she was signing consent.  Myrene Galas, MD [facility], [facility] [phone]

## 2022-10-12 NOTE — Anesthesia Procedure Notes (Addendum)
Procedure Name: Intubation Date/Time: 10/12/2022 8:44 AM  Performed by: Colon Flattery, CRNAPre-anesthesia Checklist: Patient identified, Emergency Drugs available, Suction available and Patient being monitored Patient Re-evaluated:Patient Re-evaluated prior to induction Oxygen Delivery Method: Circle system utilized Preoxygenation: Pre-oxygenation with 100% oxygen Induction Type: IV induction, Rapid sequence and Cricoid Pressure applied Laryngoscope Size: Mac and 4 Grade View: Grade I Tube type: Oral Tube size: 7.0 mm Number of attempts: 1 Airway Equipment and Method: Stylet and Oral airway Placement Confirmation: ETT inserted through vocal cords under direct vision, positive ETCO2 and breath sounds checked- equal and bilateral Secured at: 22 cm Tube secured with: Tape Dental Injury: Teeth and Oropharynx as per pre-operative assessment  Comments: NGT applied to suction before induction

## 2022-10-12 NOTE — Transfer of Care (Signed)
Immediate Anesthesia Transfer of Care Note  Patient: Kelli Hudson  Procedure(s) Performed: IRRIGATION AND  DEBRIDEMENT  BILATERAL LEGS (Bilateral) APPLICATION OF WOUND VAC BILATERAL LEGS (Bilateral)  Patient Location: PACU  Anesthesia Type:General  Level of Consciousness: drowsy and patient cooperative  Airway & Oxygen Therapy: Patient Spontanous Breathing and Patient connected to face mask oxygen  Post-op Assessment: Report given to RN, Post -op Vital signs reviewed and stable, and Patient moving all extremities  Post vital signs: Reviewed and stable  Last Vitals:  Vitals Value Taken Time  BP    Temp    Pulse    Resp    SpO2      Last Pain:  Vitals:   10/12/22 0800  TempSrc:   PainSc: 8       Patients Stated Pain Goal: 3 (10/08/22 1432)  Complications: No notable events documented.

## 2022-10-12 NOTE — Plan of Care (Signed)
  Problem: Clinical Measurements: Goal: Ability to maintain clinical measurements within normal limits will improve Outcome: Progressing   

## 2022-10-12 NOTE — Plan of Care (Signed)
°  Problem: Education: °Goal: Knowledge of General Education information will improve °Description: Including pain rating scale, medication(s)/side effects and non-pharmacologic comfort measures °Outcome: Not Progressing °  °Problem: Health Behavior/Discharge Planning: °Goal: Ability to manage health-related needs will improve °Outcome: Not Progressing °  °Problem: Clinical Measurements: °Goal: Ability to maintain clinical measurements within normal limits will improve °Outcome: Not Progressing °Goal: Will remain free from infection °Outcome: Not Progressing °Goal: Diagnostic test results will improve °Outcome: Not Progressing °Goal: Respiratory complications will improve °Outcome: Not Progressing °Goal: Cardiovascular complication will be avoided °Outcome: Not Progressing °  °Problem: Activity: °Goal: Risk for activity intolerance will decrease °Outcome: Not Progressing °  °Problem: Nutrition: °Goal: Adequate nutrition will be maintained °Outcome: Not Progressing °  °Problem: Coping: °Goal: Level of anxiety will decrease °Outcome: Not Progressing °  °Problem: Elimination: °Goal: Will not experience complications related to bowel motility °Outcome: Not Progressing °Goal: Will not experience complications related to urinary retention °Outcome: Not Progressing °  °Problem: Pain Managment: °Goal: General experience of comfort will improve °Outcome: Not Progressing °  °Problem: Safety: °Goal: Ability to remain free from injury will improve °Outcome: Not Progressing °  °

## 2022-10-12 NOTE — Progress Notes (Signed)
2200 - On reassessment patients right thigh incision site bleeding under wound vac dressing and seeping around dressing. Dressing reinforced, bloody output of 250cc noted in canister from previously marked 80cc at 2000. On call provider Dr. Carola Frost notified as this dressing has been changed 4 times on day shift by PA due to same issue. MD advised to continue reinforcing dressing and to check a CBC and INR, orders placed by this RN and carried out.  0500 - On reassessment patient with 10 out of 10 pain to right thigh, swelling at site and notable blood pooling under dressing. Called MD Handy who advised patient would need to be taken back to OR today and to hold tube feeds. No new orders at this time.

## 2022-10-12 NOTE — Progress Notes (Signed)
eLink Physician-Brief Progress Note Patient Name: Kelli Hudson DOB: 03-10-1990 MRN: [id number]   Date of Service  10/12/2022  HPI/Events of Note  Patient has percocet order placed every 8 hours and has pain especially during dressing changes. RN asking for additional meds. Dose is due at 4 am so will go ahead and give that dose now.   eICU Interventions  As above      Intervention Category Intermediate Interventions: Pain - evaluation and management  Earlisha Sharples G Emmajean Ratledge 10/12/2022, 2:31 AM

## 2022-10-12 NOTE — Anesthesia Preprocedure Evaluation (Signed)
Anesthesia Evaluation  Patient identified by MRN, date of birth, ID band Patient awake    Reviewed: Allergy & Precautions, NPO status , Patient's Chart, lab work & pertinent test results  Airway Mallampati: I  TM Distance: >3 FB Neck ROM: Full   Comment: Pt has tongue piercing that she says cannot be removed. States has had many surgeries with it in place. Counseled on risks of the piercing staying in place. Pt understands and would like to proceed with piercing in place.  Dental no notable dental hx. (+) Teeth Intact, Dental Advisory Given   Pulmonary neg pulmonary ROS   breath sounds clear to auscultation       Cardiovascular hypertension, Pt. on home beta blockers and Pt. on medications  Rhythm:Regular Rate:Normal     Neuro/Psych  PSYCHIATRIC DISORDERS Anxiety Depression    negative neurological ROS     GI/Hepatic Neg liver ROS, PUD,GERD  ,,Diabetic gastroparesis with gastric stimulator implant   Endo/Other  diabetes, Poorly Controlled, Type 1, Insulin Dependent Hyperthyroidism Lab Results      Component                Value               Date                      HGBA1C                   8.8 (A)             06/18/2022             Renal/GU Renal Insufficiency and ARFRenal diseaseLab Results      Component                Value               Date                      NA                       129 (L)             10/08/2022                K                        4.5                 10/08/2022                CO2                      17 (L)              10/08/2022                GLUCOSE                  125 (H)             10/08/2022                BUN                      23 (H)              10/08/2022  CREATININE               1.71 (H)            10/08/2022                CALCIUM                  7.9 (L)             10/08/2022                GFR                      71.90               09/24/2020                 EGFR                     66                  02/04/2021                GFRNONAA                 41 (L)              10/08/2022             negative genitourinary   Musculoskeletal negative musculoskeletal ROS (+)    Abdominal   Peds  Hematology  (+) Blood dyscrasia, anemia Lab Results      Component                Value               Date                      WBC                      46.9 (H)            10/08/2022                HGB                      7.4 (L)             10/08/2022                HCT                      23.3 (L)            10/08/2022                MCV                      91.4                10/08/2022                PLT                      457 (H)             10/08/2022              Anesthesia Other Findings 32 y.o. female with medical history significant of DM1, diabetic  gastroparesis with very frequent admission in the past, HTN presented w/ c/o B/l foot wounds, fever, and pain.  Reproductive/Obstetrics                             Anesthesia Physical Anesthesia Plan  ASA: 4  Anesthesia Plan: General   Post-op Pain Management:    Induction: Intravenous, Rapid sequence and Cricoid pressure planned  PONV Risk Score and Plan: 3 and Ondansetron and Dexamethasone  Airway Management Planned: Oral ETT  Additional Equipment:   Intra-op Plan:   Post-operative Plan: Possible Post-op intubation/ventilation and Extubation in OR  Informed Consent: I have reviewed the patients History and Physical, chart, labs and discussed the procedure including the risks, benefits and alternatives for the proposed anesthesia with the patient or authorized representative who has indicated his/her understanding and acceptance.     Dental advisory given  Plan Discussed with: CRNA  Anesthesia Plan Comments:        Anesthesia Quick Evaluation

## 2022-10-12 NOTE — Anesthesia Postprocedure Evaluation (Signed)
Anesthesia Post Note  Patient: Network engineer  Procedure(s) Performed: IRRIGATION AND  DEBRIDEMENT  BILATERAL LEGS (Bilateral) APPLICATION OF WOUND VAC BILATERAL LEGS (Bilateral)     Patient location during evaluation: PACU Anesthesia Type: General Level of consciousness: awake and alert Pain management: pain level controlled Vital Signs Assessment: post-procedure vital signs reviewed and stable Respiratory status: spontaneous breathing, nonlabored ventilation, respiratory function stable and patient connected to nasal cannula oxygen Cardiovascular status: blood pressure returned to baseline and stable Postop Assessment: no apparent nausea or vomiting Anesthetic complications: no  No notable events documented.  Last Vitals:  Vitals:   10/12/22 1400 10/12/22 1500  BP: 130/67 119/70  Pulse: 99 (!) 103  Resp: 10 14  Temp: 36.7 C 37.1 C  SpO2: 100% 99%    Last Pain:  Vitals:   10/12/22 1245  TempSrc:   PainSc: Asleep                 Kennieth Rad

## 2022-10-13 ENCOUNTER — Other Ambulatory Visit: Payer: Self-pay

## 2022-10-13 ENCOUNTER — Inpatient Hospital Stay (HOSPITAL_COMMUNITY): Payer: Medicaid Other | Admitting: Anesthesiology

## 2022-10-13 ENCOUNTER — Encounter (HOSPITAL_COMMUNITY)
Admission: EM | Disposition: A | Discharge status: Skilled Nursing Facility | Payer: Self-pay | Source: Home / Self Care | Attending: Internal Medicine

## 2022-10-13 ENCOUNTER — Encounter (HOSPITAL_COMMUNITY): Payer: Self-pay | Admitting: Orthopedic Surgery

## 2022-10-13 ENCOUNTER — Ambulatory Visit: Payer: Medicaid Other | Admitting: Podiatry

## 2022-10-13 DIAGNOSIS — M726 Necrotizing fasciitis: Secondary | ICD-10-CM

## 2022-10-13 DIAGNOSIS — I739 Peripheral vascular disease, unspecified: Secondary | ICD-10-CM | POA: Diagnosis not present

## 2022-10-13 DIAGNOSIS — F418 Other specified anxiety disorders: Secondary | ICD-10-CM

## 2022-10-13 DIAGNOSIS — I10 Essential (primary) hypertension: Secondary | ICD-10-CM

## 2022-10-13 HISTORY — PX: I & D EXTREMITY: SHX5045

## 2022-10-13 LAB — GLUCOSE, CAPILLARY
Glucose-Capillary: 111 mg/dL — ABNORMAL HIGH (ref 70–99)
Glucose-Capillary: 126 mg/dL — ABNORMAL HIGH (ref 70–99)
Glucose-Capillary: 157 mg/dL — ABNORMAL HIGH (ref 70–99)
Glucose-Capillary: 180 mg/dL — ABNORMAL HIGH (ref 70–99)
Glucose-Capillary: 185 mg/dL — ABNORMAL HIGH (ref 70–99)
Glucose-Capillary: 198 mg/dL — ABNORMAL HIGH (ref 70–99)
Glucose-Capillary: 63 mg/dL — ABNORMAL LOW (ref 70–99)
Glucose-Capillary: 63 mg/dL — ABNORMAL LOW (ref 70–99)
Glucose-Capillary: 66 mg/dL — ABNORMAL LOW (ref 70–99)
Glucose-Capillary: 79 mg/dL (ref 70–99)
Glucose-Capillary: 82 mg/dL (ref 70–99)
Glucose-Capillary: 92 mg/dL (ref 70–99)

## 2022-10-13 LAB — CULTURE, BLOOD (ROUTINE X 2)
Culture: NO GROWTH
Culture: NO GROWTH
Special Requests: ADEQUATE

## 2022-10-13 LAB — BASIC METABOLIC PANEL
Anion gap: 9 (ref 5–15)
BUN: 50 mg/dL — ABNORMAL HIGH (ref 6–20)
CO2: 20 mmol/L — ABNORMAL LOW (ref 22–32)
Calcium: 7.7 mg/dL — ABNORMAL LOW (ref 8.9–10.3)
Chloride: 106 mmol/L (ref 98–111)
Creatinine, Ser: 1.96 mg/dL — ABNORMAL HIGH (ref 0.44–1.00)
GFR, Estimated: 34 mL/min — ABNORMAL LOW (ref >60)
Glucose, Bld: 225 mg/dL — ABNORMAL HIGH (ref 70–99)
Potassium: 4.3 mmol/L (ref 3.5–5.1)
Sodium: 135 mmol/L (ref 135–145)

## 2022-10-13 LAB — AEROBIC/ANAEROBIC CULTURE W GRAM STAIN (SURGICAL/DEEP WOUND)
Gram Stain: NONE SEEN
Gram Stain: NONE SEEN

## 2022-10-13 LAB — VITAMIN A: Vitamin A (Retinoic Acid): 12.1 ug/dL — ABNORMAL LOW (ref 18.9–57.3)

## 2022-10-13 SURGERY — IRRIGATION AND DEBRIDEMENT EXTREMITY
Anesthesia: General | Site: Thigh | Laterality: Right

## 2022-10-13 MED ORDER — PROPOFOL 10 MG/ML IV BOLUS
INTRAVENOUS | Status: AC
  Filled 2022-10-13: qty 20

## 2022-10-13 MED ORDER — METOCLOPRAMIDE HCL 10 MG PO TABS
10.0000 mg | ORAL_TABLET | Freq: Three times a day (TID) | ORAL | Status: DC
  Administered 2022-10-13 – 2022-10-15 (×6): 10 mg
  Filled 2022-10-13 (×8): qty 1

## 2022-10-13 MED ORDER — HYDROMORPHONE HCL 1 MG/ML IJ SOLN
1.0000 mg | Freq: Once | INTRAMUSCULAR | Status: AC
  Administered 2022-10-13: 1 mg via INTRAVENOUS
  Filled 2022-10-13: qty 1

## 2022-10-13 MED ORDER — ROCURONIUM BROMIDE 10 MG/ML (PF) SYRINGE
PREFILLED_SYRINGE | INTRAVENOUS | Status: DC | PRN
  Administered 2022-10-13: 50 mg via INTRAVENOUS

## 2022-10-13 MED ORDER — PROPOFOL 10 MG/ML IV BOLUS
INTRAVENOUS | Status: DC | PRN
  Administered 2022-10-13: 120 mg via INTRAVENOUS

## 2022-10-13 MED ORDER — MIDAZOLAM HCL 2 MG/2ML IJ SOLN
INTRAMUSCULAR | Status: AC
  Filled 2022-10-13: qty 2

## 2022-10-13 MED ORDER — MIDAZOLAM HCL 2 MG/2ML IJ SOLN: 0.5000 mg | Freq: Once | INTRAMUSCULAR | Status: DC | PRN

## 2022-10-13 MED ORDER — ONDANSETRON HCL 4 MG/2ML IJ SOLN
INTRAMUSCULAR | Status: DC | PRN
  Administered 2022-10-13: 4 mg via INTRAVENOUS

## 2022-10-13 MED ORDER — OXYCODONE HCL 5 MG PO TABS: 5.0000 mg | ORAL_TABLET | Freq: Once | ORAL | Status: DC | PRN

## 2022-10-13 MED ORDER — SODIUM CHLORIDE 0.9 % IV SOLN: INTRAVENOUS | Status: DC | PRN

## 2022-10-13 MED ORDER — PHENYLEPHRINE 80 MCG/ML (10ML) SYRINGE FOR IV PUSH (FOR BLOOD PRESSURE SUPPORT)
PREFILLED_SYRINGE | INTRAVENOUS | Status: DC | PRN
  Administered 2022-10-13: 160 ug via INTRAVENOUS

## 2022-10-13 MED ORDER — HYDROMORPHONE HCL 1 MG/ML IJ SOLN
INTRAMUSCULAR | Status: AC
  Filled 2022-10-13: qty 1

## 2022-10-13 MED ORDER — BANATROL TF EN LIQD
60.0000 mL | Freq: Two times a day (BID) | ENTERAL | Status: DC
  Administered 2022-10-13 – 2022-10-25 (×23): 60 mL
  Filled 2022-10-13 (×26): qty 60

## 2022-10-13 MED ORDER — MEPERIDINE HCL 25 MG/ML IJ SOLN: 6.2500 mg | INTRAMUSCULAR | Status: DC | PRN

## 2022-10-13 MED ORDER — OXYCODONE HCL 5 MG/5ML PO SOLN: 5.0000 mg | Freq: Once | ORAL | Status: DC | PRN

## 2022-10-13 MED ORDER — ACETAMINOPHEN 500 MG PO TABS
1000.0000 mg | ORAL_TABLET | Freq: Three times a day (TID) | ORAL | Status: DC
  Administered 2022-10-13 – 2022-10-14 (×2): 1000 mg
  Filled 2022-10-13 (×3): qty 2

## 2022-10-13 MED ORDER — FOLIC ACID 1 MG PO TABS
1.0000 mg | ORAL_TABLET | Freq: Every day | ORAL | Status: DC
  Administered 2022-10-13 – 2022-10-15 (×3): 1 mg
  Filled 2022-10-13 (×3): qty 1

## 2022-10-13 MED ORDER — THIAMINE MONONITRATE 100 MG PO TABS
100.0000 mg | ORAL_TABLET | Freq: Every day | ORAL | Status: DC
  Administered 2022-10-13 – 2022-10-15 (×3): 100 mg
  Filled 2022-10-13 (×3): qty 1

## 2022-10-13 MED ORDER — FENTANYL CITRATE (PF) 250 MCG/5ML IJ SOLN
INTRAMUSCULAR | Status: DC | PRN
  Administered 2022-10-13: 150 ug via INTRAVENOUS

## 2022-10-13 MED ORDER — FENTANYL CITRATE (PF) 250 MCG/5ML IJ SOLN
INTRAMUSCULAR | Status: AC
  Filled 2022-10-13: qty 5

## 2022-10-13 MED ORDER — HYDROMORPHONE HCL 1 MG/ML IJ SOLN
0.2500 mg | INTRAMUSCULAR | Status: DC | PRN
  Administered 2022-10-13: 0.5 mg via INTRAVENOUS

## 2022-10-13 MED ORDER — 0.9 % SODIUM CHLORIDE (POUR BTL) OPTIME
TOPICAL | Status: DC | PRN
  Administered 2022-10-13: 1000 mL

## 2022-10-13 MED ORDER — SUGAMMADEX SODIUM 200 MG/2ML IV SOLN
INTRAVENOUS | Status: DC | PRN
  Administered 2022-10-13: 200 mg via INTRAVENOUS
  Administered 2022-10-13: 100 mg via INTRAVENOUS

## 2022-10-13 MED ORDER — VITAMIN C 500 MG PO TABS
500.0000 mg | ORAL_TABLET | Freq: Every day | ORAL | Status: DC
  Administered 2022-10-13 – 2022-10-15 (×3): 500 mg
  Filled 2022-10-13 (×3): qty 1

## 2022-10-13 MED ORDER — DEXTROSE 50 % IV SOLN
INTRAVENOUS | Status: DC | PRN
  Administered 2022-10-13: 12.5 g via INTRAVENOUS

## 2022-10-13 MED ORDER — COPPER 2 MG PO TABS
4.0000 mg | Freq: Every day | Status: DC
  Administered 2022-10-14: 4 mg via ORAL
  Filled 2022-10-13: qty 2

## 2022-10-13 MED ORDER — DEXTROSE 50 % IV SOLN
INTRAVENOUS | Status: AC
  Administered 2022-10-13: 50 mL
  Filled 2022-10-13: qty 50

## 2022-10-13 SURGICAL SUPPLY — 50 items
APL PRP STRL LF DISP 70% ISPRP (MISCELLANEOUS) ×1
BAG COUNTER SPONGE SURGICOUNT (BAG) ×1 IMPLANT
BAG SPNG CNTER NS LX DISP (BAG) ×1
BNDG COHESIVE 4X5 TAN STRL (GAUZE/BANDAGES/DRESSINGS) ×1 IMPLANT
BNDG GAUZE DERMACEA FLUFF 4 (GAUZE/BANDAGES/DRESSINGS) ×2 IMPLANT
BNDG GZE DERMACEA 4 6PLY (GAUZE/BANDAGES/DRESSINGS)
BRUSH SCRUB EZ PLAIN DRY (MISCELLANEOUS) ×2 IMPLANT
CANISTER WOUNDNEG PRESSURE 500 (CANNISTER) IMPLANT
CHLORAPREP W/TINT 26 (MISCELLANEOUS) ×1 IMPLANT
COVER MAYO STAND STRL (DRAPES) ×1 IMPLANT
COVER SURGICAL LIGHT HANDLE (MISCELLANEOUS) ×2 IMPLANT
DRAPE INCISE IOBAN 66X45 STRL (DRAPES) IMPLANT
DRAPE ORTHO SPLIT 77X108 STRL (DRAPES) ×1
DRAPE SURG 17X23 STRL (DRAPES) ×1 IMPLANT
DRAPE SURG ORHT 6 SPLT 77X108 (DRAPES) ×1 IMPLANT
DRAPE U-SHAPE 47X51 STRL (DRAPES) ×1 IMPLANT
DRSG ADAPTIC 3X8 NADH LF (GAUZE/BANDAGES/DRESSINGS) ×1 IMPLANT
DRSG VAC GRANUFOAM MED (GAUZE/BANDAGES/DRESSINGS) IMPLANT
ELECT REM PT RETURN 9FT ADLT (ELECTROSURGICAL) ×1
ELECTRODE REM PT RTRN 9FT ADLT (ELECTROSURGICAL) IMPLANT
ENDOLOOP SUT PDS II 0 18 (SUTURE) IMPLANT
EVACUATOR 1/8 PVC DRAIN (DRAIN) IMPLANT
GAUZE SPONGE 4X4 12PLY STRL (GAUZE/BANDAGES/DRESSINGS) ×1 IMPLANT
GLOVE BIO SURGEON STRL SZ 6.5 (GLOVE) ×3 IMPLANT
GLOVE BIO SURGEON STRL SZ7.5 (GLOVE) ×4 IMPLANT
GLOVE BIOGEL PI IND STRL 6.5 (GLOVE) ×1 IMPLANT
GLOVE BIOGEL PI IND STRL 7.5 (GLOVE) ×1 IMPLANT
GOWN STRL REUS W/ TWL LRG LVL3 (GOWN DISPOSABLE) ×2 IMPLANT
GOWN STRL REUS W/TWL LRG LVL3 (GOWN DISPOSABLE) ×3
HANDPIECE INTERPULSE COAX TIP (DISPOSABLE)
KIT BASIN OR (CUSTOM PROCEDURE TRAY) ×1 IMPLANT
KIT TURNOVER KIT B (KITS) ×1 IMPLANT
MANIFOLD NEPTUNE II (INSTRUMENTS) ×1 IMPLANT
NS IRRIG 1000ML POUR BTL (IV SOLUTION) ×1 IMPLANT
PACK ORTHO EXTREMITY (CUSTOM PROCEDURE TRAY) ×1 IMPLANT
PAD ARMBOARD 7.5X6 YLW CONV (MISCELLANEOUS) ×2 IMPLANT
PADDING CAST COTTON 6X4 STRL (CAST SUPPLIES) ×1 IMPLANT
SET HNDPC FAN SPRY TIP SCT (DISPOSABLE) IMPLANT
SPONGE T-LAP 18X18 ~~LOC~~+RFID (SPONGE) ×1 IMPLANT
SUT ETHILON 2 0 FS 18 (SUTURE) ×2 IMPLANT
SUT ETHILON 3 0 PS 1 (SUTURE) ×2 IMPLANT
SUT MON AB 2-0 CT1 36 (SUTURE) ×1 IMPLANT
SUT PDS AB 0 CT 36 (SUTURE) IMPLANT
SWAB CULTURE ESWAB REG 1ML (MISCELLANEOUS) IMPLANT
TOWEL GREEN STERILE (TOWEL DISPOSABLE) ×2 IMPLANT
TOWEL GREEN STERILE FF (TOWEL DISPOSABLE) ×1 IMPLANT
TUBE CONNECTING 12X1/4 (SUCTIONS) ×1 IMPLANT
UNDERPAD 30X36 HEAVY ABSORB (UNDERPADS AND DIAPERS) ×1 IMPLANT
WATER STERILE IRR 1000ML POUR (IV SOLUTION) ×1 IMPLANT
YANKAUER SUCT BULB TIP NO VENT (SUCTIONS) ×1 IMPLANT

## 2022-10-13 NOTE — Progress Notes (Addendum)
eLink Physician-Brief Progress Note Patient Name: Kelli Hudson DOB: 1990-12-05 MRN: [id number]   Date of Service  10/13/2022  HPI/Events of Note  32 year old female diabetes presented with necrotizing fasciitis with ongoing lower extremity wounds now developed large-volume diarrhea on broad-spectrum antibiotics.  No obvious coagulopathy/bleeding, INR 1.6  eICU Interventions  Insert Flexi-Seal.  If she develops a fever/abdominal pain/worsening leukocytosis, may need to test for C. difficile.   0022 -in severe pain despite ordered oxycodone/fentanyl.  Dilaudid administered earlier in the day was more effective.  Will discontinue fentanyl for severe pain and switch to Dilaudid 1 mg IV for breakthrough.  Maintain oxycodone.  Intervention Category Minor Interventions: Routine modifications to care plan (e.g. PRN medications for pain, fever)  Nadene Witherspoon 10/13/2022, 9:29 PM

## 2022-10-13 NOTE — Progress Notes (Signed)
eLink Physician-Brief Progress Note Patient Name: Kelli Hudson DOB: [birth year] MRN: [id number]   Date of Service  10/13/2022  HPI/Events of Note  [name] PMH Dm1 diabetic gastroparesis now s/p gastric pacemaker, HTN admitted 8/17 with bilateral lower extremity necrotizing fasciitis status post I&D.  Ongoing significant bleeding with intent to return to the OR today.  Has heparin subcutaneous scheduled  eICU Interventions  Hold subcutaneous heparin.     Intervention Category Minor Interventions: Routine modifications to care plan (e.g. PRN medications for pain, fever)  Josseline Reddin 10/13/2022, 6:19 AM

## 2022-10-13 NOTE — Interval H&P Note (Signed)
History and Physical Interval Note:  10/13/2022 12:20 PM  Kelli Hudson  has presented today for surgery, with the diagnosis of necrotizing fasciitis.  The various methods of treatment have been discussed with the patient and family. After consideration of risks, benefits and other options for treatment, the patient has consented to  Procedure(s): RIGHT THIGH WOUND VAC EXCHANGE (Right) as a surgical intervention.  The patient's history has been reviewed, patient examined, no change in status, stable for surgery.  I have reviewed the patient's chart and labs.  Questions were answered to the patient's satisfaction.     Caryn Bee P Graceson Nichelson

## 2022-10-13 NOTE — Anesthesia Postprocedure Evaluation (Signed)
Anesthesia Post Note  Patient: Network engineer  Procedure(s) Performed: RIGHT THIGH WOUND VAC EXCHANGE (Right: Thigh)     Patient location during evaluation: PACU Anesthesia Type: General Level of consciousness: awake and alert, patient cooperative and oriented Pain management: pain level controlled Vital Signs Assessment: post-procedure vital signs reviewed and stable Respiratory status: spontaneous breathing, nonlabored ventilation, respiratory function stable and patient connected to nasal cannula oxygen Cardiovascular status: blood pressure returned to baseline and stable Postop Assessment: no apparent nausea or vomiting Anesthetic complications: no   No notable events documented.  Last Vitals:  Vitals:   10/13/22 1415 10/13/22 1430  BP: (!) 144/78 (!) 142/76  Pulse: (!) 107 (!) 106  Resp: 11 11  Temp:    SpO2: 94% 94%    Last Pain:  Vitals:   10/13/22 1430  TempSrc:   PainSc: Asleep                 Guerino Caporale,E. Mcclain Shall

## 2022-10-13 NOTE — Op Note (Signed)
Orthopaedic Surgery Operative Note (CSN: [id number] ) Date of Surgery: 10/13/2022  Admit Date: 10/03/2022   Diagnoses: Pre-Op Diagnoses: Necrotizing fascitis of bilateral lower extremities Failure of right thigh wound vac  Post-Op Diagnosis: Same  Procedures: CPT 97605-Wound vac change to right thigh CPT 11043-Debridement of right thigh  Surgeons : Primary: Ayame Rena, Gillie Manners, MD  Assistant: Ulyses Southward, PA-C  Location: OR 3   Anesthesia: General   Antibiotics: Scheduled IV antibiotics  Tourniquet time: None    Estimated Blood Loss: 50 mL  Complications: None   Specimens: None   Implants: * No implants in log *   Indications for Surgery: 32 year old female with bilateral lower extremity necrotizing fasciitis then underwent irrigation debridement with Dr. Lajoyce Corners last week.  He underwent wound VAC change and repeat debridement with Dr. Carola Frost yesterday.  Unfortunately overnight the wound VAC of her right thigh lost seal and was leaking out with some blood.  They were unable to get it to seal afterwards and so she was indicated for wound VAC change.  I discussed this with the patient and her family.  Operative Findings: Successful wound VAC change under anesthesia of the right lower extremity.  Procedure: The patient was identified in the ICU. Consent was confirmed with the patient and their family and all questions were answered. The operative extremity was marked after confirmation with the patient. she was then brought back to the operating room by our anesthesia colleagues.  She was placed under general anesthetic and carefully transferred over to radiolucent flattop table.  The remainder of the wound VAC of the right thigh was removed.  No other dressings were removed.  I then identified a small subcutaneous vessel that was bleeding that likely was the culprit of the bleeding overnight.  The right lower extremity was then prepped and draped in usual sterile fashion.  A timeout  was performed to verify the patient, the procedure and the extremity.  I used electrocautery to cauterize the subcutaneous bleeder.  I then extended the incision proximally to be able to visualize the subcutaneous tissue.  There was no loss of muscle plane proximally.  There is no purulence that was identified.  There was some serosanguineous fluid that was present but no frank purulence.  I did open up the vastus lateralis to visualize the muscle belly which appeared benign and contractile.  There was no infection that was present in the subfascial region.  I then irrigated this out closed the fascial incision with 0 PDS.  I then placed a black granular foam sponge in the wound and connected to 125 mmHg.  Good seal was obtained and the patient was awoke from anesthesia and taken to the PACU in stable condition.  Post Op Plan/Instructions: The patient will return under the care of Dr. Lajoyce Corners and Dr. Dion Saucier.  Plan for surgery later in the week with Dr. Lajoyce Corners.  Otherwise IV antibiotics per infectious disease and critical care per ICU team.  I was present and performed the entire surgery.  Ulyses Southward, PA-C did assist me throughout the case. An assistant was necessary given the difficulty in approach, maintenance of reduction and ability to instrument the fracture.   Truitt Merle, MD [facility]

## 2022-10-13 NOTE — Anesthesia Preprocedure Evaluation (Addendum)
Anesthesia Evaluation  Patient identified by MRN, date of birth, ID band Patient awake    Reviewed: Allergy & Precautions, NPO status , Patient's Chart, lab work & pertinent test results  History of Anesthesia Complications Negative for: history of anesthetic complications  Airway Mallampati: II  TM Distance: >3 FB Neck ROM: Full   Comment: Tongue ring Dental  (+) Dental Advisory Given   Pulmonary  Remained Intubated yesterday, post op debridement: sepsis   breath sounds clear to auscultation       Cardiovascular hypertension, Pt. on medications (-) angina + Peripheral Vascular Disease   Rhythm:Regular Rate:Normal  Levophed BP support   Neuro/Psych   Anxiety Depression    negative neurological ROS     GI/Hepatic Neg liver ROS,GERD  Medicated and Poorly Controlled,,Feeding tube in distal stomach (pre-pyloric) Gastroparesis: has gastric stimulator   Endo/Other  diabetes, Insulin Dependent  BMI 35.5  Renal/GU Renal InsufficiencyRenal disease     Musculoskeletal   Abdominal   Peds  Hematology Hb 9.2, plt 327k INR 1.6   Anesthesia Other Findings Necrotizing fasciitis thigh: debridement yesterday, received 4u pRBC, 2u FFP, remained intubated on Levophed BP support  Reproductive/Obstetrics                             Anesthesia Physical Anesthesia Plan  ASA: 4  Anesthesia Plan: General   Post-op Pain Management:    Induction: Intravenous  PONV Risk Score and Plan: 3 and Ondansetron, Dexamethasone and Diphenhydramine  Airway Management Planned: Oral ETT  Additional Equipment: None  Intra-op Plan:   Post-operative Plan: Possible Post-op intubation/ventilation  Informed Consent: I have reviewed the patients History and Physical, chart, labs and discussed the procedure including the risks, benefits and alternatives for the proposed anesthesia with the patient or authorized  representative who has indicated his/her understanding and acceptance.     Dental advisory given  Plan Discussed with: CRNA and Surgeon  Anesthesia Plan Comments:         Anesthesia Quick Evaluation

## 2022-10-13 NOTE — Progress Notes (Signed)
NAME:  Kelli Hudson, MRN:  [id number], DOB:  February 19, 1990, LOS: 10 ADMISSION DATE:  10/03/2022, CONSULTATION DATE:  10/08/22 REFERRING MD:  Lajoyce Corners, CHIEF COMPLAINT: nec fasc   History of Present Illness:   [name] PMH Dm1 diabetic gastroparesis now s/p gastric pacemaker, HTN admitted 8/17 with leg swelling -- BLE foot wounds, which  were being followed outpt by podiatry. Admitted to [facility] for sepsis in this setting, OR with podiatry 8/18 for I&D BLE foot wounds.   ID was consulted 8/20 for ongoing fever. Concern for nec fasc 8/21 ortho consulted -- transferred to [facility], underwent excision and debridement of BLE --extending feet to thighs. EBL from this case is not recorded but pt did receive 4 PRBC 2 FFP as well as crystalloid + colloid, as well as pressors for hemodynamic support .   Remains intubated after case. PCCM consulted for ICU management.   Pertinent  Medical History  DM1 Diabetic gastroparesis S/p gastric pacemaker  HTN  Significant Hospital Events: Including procedures, antibiotic start and stop dates in addition to other pertinent events   8/17 admit to [facility] sepsis BLE wound infections. Podiatry consult 8/18 I&D with podiatry.  8/20 ID consult  8/21 ortho consult. OR for excision and debridement of BLE nec fasc-- feet to thighs. Remained intubated, PCCM consulted.  4 PRBC 2 FFP during case. 8/23-extubated  Interim History / Subjective:   Subcutaneous heparin held overnight due to bleeding from wound.  Objective   Blood pressure 123/65, pulse 83, temperature 97.9 F (36.6 C), resp. rate 12, height 5\' 7"  (1.702 m), weight 102.7 kg, SpO2 100%.        Intake/Output Summary (Last 24 hours) at 10/13/2022 0816 Last data filed at 10/13/2022 2130 Gross per 24 hour  Intake 3450.89 ml  Output 4085 ml  Net -634.11 ml   Filed Weights   10/10/22 0500 10/11/22 0400 10/12/22 0500  Weight: 106.7 kg 103.1 kg 102.7 kg    Examination: Gen:      No acute distress HEENT:  EOMI,  sclera anicteric Neck:     No masses; no thyromegaly Lungs:    Clear to auscultation bilaterally; normal respiratory effort CV:         Regular rate and rhythm; no murmurs Abd:      + bowel sounds; soft, non-tender; no palpable masses, no distension Ext: Wound vacs bilateral lower extremities Skin:      Warm and dry; no rash Neuro: alert and oriented x 3 Psych: normal mood and affect   Lab/imaging reviewed Significant for BUN/creatinine 50/1.96 No new imaging  I reviewed nursing notes, Consultant notes, last 24 h vitals and pain scores, last 48 h intake and output, last 24 h labs and trends, and last 24 h imaging results.  Resolved Hospital Problem list     Assessment & Plan:  Bilateral feet abscess, necrotizing fasciitis S/p bilateral excisional leg and thigh debridement Severe sepsis Continue Levophed, wean as tolerated Wound culture reviewed with rare Staph aureus, rare Pseudomonas aeruginosa Continue cefepime, linezolid, Flagyl Will go back to the OR today  Acute blood loss anemia due to operative blood loss Monitor CBC.  Transfuse as needed  Type 1 diabetes Uncontrolled diabetes Continue Levemir  Protein calorie malnutrition Continue tube feeds.  Currently on hold due to planned OR trip  History of gastroparesis Status post gastric pacemaker placement Continue Reglan  Hypertension Hyperlipidemia Hold antihypertensive medications  Leukocytosis Antibiotics as above.  Monitor CBC  Remains critically ill  Best Practice (right  click and "Reselect all SmartList Selections" daily)   Diet/type: tubefeeds DVT prophylaxis: other; given ABLA post op will hold for now; no scds given wound vac  GI prophylaxis: PPI Lines: Central line and Arterial Line Foley:  Yes, and it is still needed Code Status:  full code Last date of multidisciplinary goals of care discussion [pending]  Critical care time:   The patient is critically ill with multiple organ system failure  and requires high complexity decision making for assessment and support, frequent evaluation and titration of therapies, advanced monitoring, review of radiographic studies and interpretation of complex data.   Critical Care Time devoted to patient care services, exclusive of separately billable procedures, described in this note is 35 minutes.   [name] MD [facility] See Amion for pager  If no response to pager , please call [phone] until 7pm After 7:00 pm call Elink  [phone] 10/13/2022, 8:16 AM

## 2022-10-13 NOTE — Anesthesia Procedure Notes (Signed)
Procedure Name: Intubation Date/Time: 10/13/2022 1:06 PM  Performed by: Randon Goldsmith, CRNAPre-anesthesia Checklist: Patient identified, Emergency Drugs available, Suction available and Patient being monitored Patient Re-evaluated:Patient Re-evaluated prior to induction Oxygen Delivery Method: Circle system utilized Preoxygenation: Pre-oxygenation with 100% oxygen Induction Type: IV induction Ventilation: Mask ventilation without difficulty Laryngoscope Size: Glidescope and 3 Grade View: Grade I Tube type: Oral Tube size: 7.0 mm Number of attempts: 1 Airway Equipment and Method: Stylet and Oral airway Placement Confirmation: ETT inserted through vocal cords under direct vision, positive ETCO2 and breath sounds checked- equal and bilateral Secured at: 23 cm Tube secured with: Tape Dental Injury: Teeth and Oropharynx as per pre-operative assessment  Comments: Elective glidescope induction 2/2 tongue ring. Easy induction with smooth intubation using glidescope 3 blade. +BBS, +chest rise and fall, +EtCO2

## 2022-10-13 NOTE — H&P (View-Only) (Signed)
Ortho Trauma Note  Unfortunately the right thigh wound VAC overnight had significant leakage and bloody output.  A good seal was not able to be obtained.  As result we will need to proceed today for wound VAC change under anesthesia.  I discussed this with the patient and she understands.  Consent was obtained from the mother.  Plan for likely surgical debridement later in the week with Dr. Lajoyce Corners.  Roby Lofts, MD [facility] [phone] (office) [email or web address]

## 2022-10-13 NOTE — Progress Notes (Signed)
Nutrition Follow-up  DOCUMENTATION CODES:  Not applicable  INTERVENTION:  Continue tube feeding via Cortrak. Resume at goal after OR: Vital 1.5 at 60 ml/h (1440 ml per day) Prosource TF20 60 ml 1x/d Provides 2240 kcal, 117 gm protein, 1100 ml free water daily Banatrol BID to aid with loose stools Monitor results of micronutrients: Vitamin A. Add replacement as needed 1 packet Juven BID, each packet provides 95 calories, 2.5 grams of protein (collagen), and 9.8 grams of carbohydrate (3 grams sugar); also contains 7 grams of L-arginine and L-glutamine, 300 mg vitamin C, 15 mg vitamin E, 1.2 mcg vitamin B-12, 9.5 mg zinc, 200 mg calcium, and 1.5 g  Calcium Beta-hydroxy-Beta-methylbutyrate to support wound healing MVI with minerals daily Vitamin D 1000 international units x 30 days for deficiency Zinc 220mg  x 14 days for deficiency Reduce Vitamin C to 500mg  1x/d. Stop after 30 doses. No signs of deficiency per serum levels Oral Copper 4mg  x 14 days for deficiency  NUTRITION DIAGNOSIS:  Increased nutrient needs related to wound healing as evidenced by estimated needs. - remains applicable  GOAL:  Patient will meet greater than or equal to 90% of their needs - progressing, being met with feeds at goal  MONITOR:  PO intake, Supplement acceptance  REASON FOR ASSESSMENT:  Consult Enteral/tube feeding initiation and management  ASSESSMENT:  32 y.o. female admits related to leg swelling. PMH includes: acute H. Pylori gastric ulcer, DM, gastroparesis, DKA, GERD, HTN, hyperthyroidism, normocytic anemia. Pt is currently receiving medical management related to sepsis due to cellulitis.  8/18 - OR, I&D of bilateral feet 8/21 - transferred to ICU, OR excisional debridement of bilateral legs (feet to thigh), 4 wound vacs placed in OR  8/23 - Cortrak tube placed, extubated  8/26 - back to OR for vac change  Pt with issues with right thigh wound VAC overnight and scheduled to be taken back to  the OR this afternoon. Discussed in rounds. RN reports pt tolerating gastric feeds well so far. Will continue current nutrition regimen.    Intake/Output Summary (Last 24 hours) at 10/13/2022 1257 Last data filed at 10/13/2022 1100 Gross per 24 hour  Intake 2333.48 ml  Output 3670 ml  Net -1336.52 ml  Net IO Since Admission: 13,849.16 mL [10/13/22 1257] - Left leg wound vacs: out x 24 hours - Right leg wound vacs:445mL out x 24 hours  Average Meal Intake: 8/17-8/20: 70% intake x 4 recorded meals prior to OR  Nutritionally Relevant Medications: Scheduled Meds:  vitamin C  500 mg BID   docusate  100 mg BID   PROSource TF20  60 mL Daily   folic acid  1 mg Daily   insulin aspart  0-9 Units Q4H   insulin detemir  5 Units BID   linezolid  600 mg Q12H   metoCLOPramide   10 mg Q8H   multivitamin with minerals  1 tablet Daily   JUVEN  1 packet BID BM   pantoprazole IV  40 mg Q12H   polyethylene glycol  17 g Daily   thiamine  100 mg Daily   zinc sulfate  220 mg Daily   Continuous Infusions:  ceFEPime (MAXIPIME) IV Stopped (10/13/22 0352)   feeding supplement (VITAL 1.5 CAL) Stopped (10/13/22 0510)   methocarbamol (ROBAXIN) IV 1,000 mg (10/13/22 8295)   norepinephrine (LEVOPHED) Adult infusion 1 mcg/min (10/13/22 0600)   PRN Meds: alum & mag hydroxide-simeth, bisacodyl,  ondansetron, phenol, polyethylene glycol  Labs Reviewed: BUN 50, creatinine 1.96 CBG  ranges from 82-180 mg/dL over the last 24 hours HgbA1c 8.8% (5/1)  Micronutrient Profile: CRP 17.2 (high, may skew micronutrient results) Vitamin D: 21.49 (low) Vitamin A pending Vitamin C 0.6 (WNL) Zinc 34 (low) Copper 39 (low)  NUTRITION - FOCUSED PHYSICAL EXAM: Flowsheet Row Most Recent Value  Orbital Region No depletion  Upper Arm Region No depletion  Thoracic and Lumbar Region No depletion  Buccal Region No depletion  Temple Region No depletion  Clavicle Bone Region No depletion  Clavicle and Acromion Bone  Region Mild depletion  Scapular Bone Region No depletion  Dorsal Hand No depletion  Patellar Region Unable to assess  Anterior Thigh Region Unable to assess  Posterior Calf Region Unable to assess  Edema (RD Assessment) Moderate  Hair Reviewed  Eyes Reviewed  Mouth Reviewed  Skin Reviewed  Nails Reviewed    Diet Order:   Diet Order             Diet NPO time specified  Diet effective midnight                   EDUCATION NEEDS:  Not appropriate for education at this time  Skin:  Skin Integrity Issues: Stage 2: - right elbow ( 2.5 cm x 3 cm x 0.1 cm) Surgical incisions: - bilateral legs, wound vac x 4 (2 to each leg)   Last BM:  8/26 - type 7  Height:  Ht Readings from Last 1 Encounters:  10/08/22 5\' 7"  (1.702 m)   Weight:  Wt Readings from Last 1 Encounters:  10/12/22 102.7 kg   Ideal Body Weight:  61.4 kg  BMI:  Body mass index is 35.46 kg/m.  Estimated Nutritional Needs:  Kcal:  2200-2500 kcal/d Protein:  115-130g/d Fluid:  >/= 2.3 L    Greig Castilla, RD, LDN Clinical Dietitian RD pager # available in AMION  After hours/weekend pager # available in [facility]

## 2022-10-13 NOTE — Transfer of Care (Signed)
Immediate Anesthesia Transfer of Care Note  Patient: Kelli Hudson  Procedure(s) Performed: RIGHT THIGH WOUND VAC EXCHANGE (Right: Thigh)  Patient Location: PACU  Anesthesia Type:General  Level of Consciousness: drowsy and patient cooperative  Airway & Oxygen Therapy: Patient Spontanous Breathing  Post-op Assessment: Report given to RN and Post -op Vital signs reviewed and stable  Post vital signs: Reviewed and stable  Last Vitals:  Vitals Value Taken Time  BP    Temp    Pulse 108 10/13/22 1403  Resp 10 10/13/22 1403  SpO2 96 % 10/13/22 1403  Vitals shown include unfiled device data.  Last Pain:  Vitals:   10/13/22 1200  TempSrc:   PainSc: 0-No pain      Patients Stated Pain Goal: 8 (10/13/22 2956)  Complications: No notable events documented.

## 2022-10-13 NOTE — Progress Notes (Signed)
[facility]  Date of Admission:  10/03/2022     Total days of antibiotics 11         ASSESSMENT:  Kelli Hudson is POD #1 from bilateral leg debridement for necrotizing fasciitis with plans to return to the OR today for right thigh wound vac exchange. Surgical specimen cultures are polymicrobial with MSSA (R- tetracylines), Pseudomonas aeruginosa, MRSE, and Corynebacterium species. Tolerating linezolid, cefepime and metronidazole. Renal function and platelet count are stable and will continue therapeutic drug monitoring. Continue post-operative wound care per Orthopedics with remaining medical and supportive care per CCM.   PLAN:  Continue current dose of linezolid, cefepime and metronidazole. Monitor cultures for any additional organisms and adjust antibiotics accordingly.  Post-operative care and further surgical interventions per Orthopedics. Therapeutic drug monitoring of renal function and platelet levels.  Remaining medical and supportive care per CCM.  I have personally spent 24 minutes involved in face-to-face and non-face-to-face activities for this patient on the day of the visit. Professional time spent includes the following activities: Preparing to see the patient (review of tests), Obtaining and/or reviewing separately obtained history (admission/discharge record), Performing a medically appropriate examination and/or evaluation , Ordering medications/tests/procedures, referring and communicating with other health care professionals, Documenting clinical information in the EMR, Independently interpreting results (not separately reported), Communicating results to the patient/family/caregiver, Counseling and educating the patient/family/caregiver and Care coordination (not separately reported).    Principal Problem:   Necrotizing fasciitis (HCC) Active Problems:   Anemia   Sepsis (HCC)   AKI (acute kidney injury) (HCC)   Diabetic  gastroparesis associated with type 1 diabetes mellitus (HCC)   DM type 1 (diabetes mellitus, type 1) (HCC)   Drug-seeking behavior   Sepsis due to cellulitis (HCC)   Cellulitis of lower extremity   Necrotizing fasciitis of pelvic region and thigh (HCC)   Necrotizing fasciitis of lower leg (HCC)    acetaminophen  1,000 mg Per Tube Q8H   vitamin C  500 mg Per Tube Daily   Chlorhexidine Gluconate Cloth  6 each Topical Daily   docusate  100 mg Per Tube BID   feeding supplement (PROSource TF20)  60 mL Per Tube Daily   folic acid  1 mg Per Tube Daily   gabapentin  300 mg Per Tube Q8H   heparin injection (subcutaneous)  5,000 Units Subcutaneous Q8H   insulin aspart  0-9 Units Subcutaneous Q4H   insulin detemir  5 Units Subcutaneous BID   linezolid  600 mg Per Tube Q12H   metoCLOPramide (REGLAN) injection  10 mg Intravenous Q8H   metroNIDAZOLE  500 mg Per Tube Q12H   multivitamin with minerals  1 tablet Per Tube Daily   nutrition supplement (JUVEN)  1 packet Per Tube BID BM   mouth rinse  15 mL Mouth Rinse 4 times per day   pantoprazole (PROTONIX) IV  40 mg Intravenous Q12H   polyethylene glycol  17 g Per Tube Daily   sodium chloride flush  10-40 mL Intracatheter Q12H   thiamine  100 mg Per Tube Daily   zinc sulfate  220 mg Per Tube Daily    SUBJECTIVE:  Febrile overnight with temperature of 100.8 F and continued leukocytosis with WBC count 42.8. Continues to have bilateral leg pain.   Allergies  Allergen Reactions   Ibuprofen Other (See Comments)    07/23/22 - Worsening kidney function after 3 doses of ibuprofen 400mg .  Creatinine increased from 1.3 to 2.  Creatinine returned to  normal after stopping.   Lisinopril Cough, Other (See Comments), Itching and Rash    Breakout in rash     Review of Systems: Review of Systems  Constitutional:  Positive for fever. Negative for chills and weight loss.  Respiratory:  Negative for cough, shortness of breath and wheezing.    Cardiovascular:  Positive for leg swelling. Negative for chest pain.  Gastrointestinal:  Negative for abdominal pain, constipation, diarrhea, nausea and vomiting.  Musculoskeletal:        Bilateral leg pain.   Skin:  Negative for rash.      OBJECTIVE: Vitals:   10/13/22 0645 10/13/22 0700 10/13/22 0800 10/13/22 0900  BP:      Pulse: 83 81 82 86  Resp: 12 12 12 11   Temp: 97.9 F (36.6 C) 97.7 F (36.5 C) (!) 97.5 F (36.4 C) (!) 97.3 F (36.3 C)  TempSrc:   Bladder   SpO2: 100% 100% 100% 100%  Weight:      Height:       Body mass index is 35.46 kg/m.  Physical Exam Constitutional:      Appearance: She is well-developed. She is ill-appearing.  HENT:     Nose:     Comments: Coretrak in place.  Cardiovascular:     Rate and Rhythm: Normal rate and regular rhythm.     Heart sounds: Normal heart sounds.  Pulmonary:     Effort: Pulmonary effort is normal.     Breath sounds: Normal breath sounds.  Musculoskeletal:     Comments: Bilateral wound vacs in place with serosanguinous drainage.  Skin:    General: Skin is warm and dry.  Neurological:     Mental Status: She is alert.     Lab Results Lab Results  Component Value Date   WBC 42.8 (H) 10/12/2022   HGB 9.2 (L) 10/12/2022   HCT 28.5 (L) 10/12/2022   MCV 86.4 10/12/2022   PLT 327 10/12/2022    Lab Results  Component Value Date   CREATININE 1.96 (H) 10/13/2022   BUN 50 (H) 10/13/2022   NA 135 10/13/2022   K 4.3 10/13/2022   CL 106 10/13/2022   CO2 20 (L) 10/13/2022    Lab Results  Component Value Date   ALT 12 10/07/2022   AST 14 (L) 10/07/2022   ALKPHOS 110 10/07/2022   BILITOT 0.6 10/07/2022     Microbiology: Recent Results (from the past 240 hour(s))  Culture, blood (Routine x 2)     Status: None   Collection Time: 10/03/22  1:52 PM   Specimen: BLOOD RIGHT ARM  Result Value Ref Range Status   Specimen Description   Final    BLOOD RIGHT ARM Performed at [facility], [address], [place], [place] [address]    Special Requests   Final    BOTTLES DRAWN AEROBIC AND ANAEROBIC Blood Culture adequate volume Performed at [facility], [address] [place], [place] [address]    Culture   Final    NO GROWTH 5 DAYS Performed at [facility], [address] [place], [place] [address]    Report Status 10/08/2022 FINAL  Final  SARS Coronavirus 2 by RT PCR (hospital order, performed in [facility] hospital lab) *cepheid single result test* Anterior Nasal Swab     Status: None   Collection Time: 10/03/22  1:54 PM   Specimen: Anterior Nasal Swab  Result Value Ref Range Status   SARS Coronavirus 2 by RT  PCR NEGATIVE NEGATIVE Final    Comment: (NOTE) SARS-CoV-2 target nucleic acids are NOT DETECTED.  The SARS-CoV-2 RNA is generally detectable in upper and lower respiratory specimens during the acute phase of infection. The lowest concentration of SARS-CoV-2 viral copies this assay can detect is 250 copies / mL. A negative result does not preclude SARS-CoV-2 infection and should not be used as the sole basis for treatment or other patient management decisions.  A negative result may occur with improper specimen collection / handling, submission of specimen other than nasopharyngeal swab, presence of viral mutation(s) within the areas targeted by this assay, and inadequate number of viral copies (<250 copies / mL). A negative result must be combined with clinical observations, patient history, and epidemiological information.  Fact Sheet for Patients:   RoadLapTop.co.za  Fact Sheet for Healthcare Providers: http://kim-miller.com/  This test is not yet approved or  cleared by the [place] FDA and has been authorized for detection and/or diagnosis of SARS-CoV-2 by FDA under an Emergency Use Authorization (EUA).  This EUA will remain in effect (meaning this test can be used) for the duration of  the COVID-19 declaration under Section 564(b)(1) of the Act, 21 U.S.C. section 360bbb-3(b)(1), unless the authorization is terminated or revoked sooner.  Performed at [facility], [address] [place], [place] [address]   Aerobic/Anaerobic Culture w Gram Stain (surgical/deep wound)     Status: None   Collection Time: 10/05/22 10:48 AM   Specimen: Wound  Result Value Ref Range Status   Specimen Description   Final    WOUND WOUND, RIGHT FOOT Performed at [facility], [address] [place], [place] [address]    Special Requests   Final    NONE Performed at [facility], [address] [place], [place] [address]    Gram Stain   Final    MODERATE WBC PRESENT,BOTH PMN AND MONONUCLEAR NO ORGANISMS SEEN    Culture   Final    No growth aerobically or anaerobically. Performed at [facility], [address] [place], [place] [address]    Report Status 10/10/2022 FINAL  Final  MRSA Next Gen by PCR, Nasal     Status: None   Collection Time: 10/07/22 12:20 PM   Specimen: Nasal Mucosa; Nasal Swab  Result Value Ref Range Status   MRSA by PCR Next Gen NOT DETECTED NOT DETECTED Final    Comment: (NOTE) The GeneXpert MRSA Assay (FDA approved for NASAL specimens only), is one component of a comprehensive MRSA colonization surveillance program. It is not intended to diagnose MRSA infection nor to guide or monitor treatment for MRSA infections. Test performance is not FDA approved in patients less than 57 years old. Performed at [facility], [address], [place], [place] [address]   Culture, blood (Routine X 2) w Reflex to ID Panel     Status: None (Preliminary result)   Collection Time: 10/07/22 10:08 PM   Specimen: BLOOD LEFT HAND  Result Value Ref Range Status   Specimen Description BLOOD LEFT HAND  Final   Special Requests   Final    BOTTLES DRAWN AEROBIC ONLY Blood Culture adequate volume    Culture   Final    NO GROWTH 4 DAYS Performed at [facility], [address] [place], [place] [address]    Report Status PENDING  Incomplete  Culture, blood (Routine X 2) w Reflex to ID Panel     Status:  None (Preliminary result)   Collection Time: 10/07/22 10:10 PM   Specimen: BLOOD LEFT HAND  Result Value Ref Range Status   Specimen Description BLOOD LEFT HAND  Final   Special Requests   Final    BOTTLES DRAWN AEROBIC ONLY Blood Culture results may not be optimal due to an inadequate volume of blood received in culture bottles   Culture   Final    NO GROWTH 4 DAYS Performed at [facility], [address] [place], [place] [address]    Report Status PENDING  Incomplete  Aerobic/Anaerobic Culture w Gram Stain (surgical/deep wound)     Status: None (Preliminary result)   Collection Time: 10/08/22  4:11 PM   Specimen: Path Tissue  Result Value Ref Range Status   Specimen Description TISSUE  Final   Special Requests RIGHT CALF SPEC A  Final   Gram Stain   Final    RARE WBC PRESENT, PREDOMINANTLY PMN NO ORGANISMS SEEN Gram Stain Report Called to,Read Back By and Verified With: RN Budd Palmer [phone] @1740  FH Performed at [facility], [address], [place], [place] [address]    Culture   Final    RARE STAPHYLOCOCCUS EPIDERMIDIS NO ANAEROBES ISOLATED; CULTURE IN PROGRESS FOR 5 DAYS    Report Status PENDING  Incomplete   Organism ID, Bacteria STAPHYLOCOCCUS EPIDERMIDIS  Final      Susceptibility   Staphylococcus epidermidis - MIC*    CIPROFLOXACIN <=0.5 SENSITIVE Sensitive     ERYTHROMYCIN >=8 RESISTANT Resistant     GENTAMICIN <=0.5 SENSITIVE Sensitive     OXACILLIN >=4 RESISTANT Resistant     TETRACYCLINE >=16 RESISTANT Resistant     VANCOMYCIN 1 SENSITIVE Sensitive     TRIMETH/SULFA 160 RESISTANT Resistant     CLINDAMYCIN <=0.25 SENSITIVE Sensitive     RIFAMPIN <=0.5 SENSITIVE Sensitive     Inducible Clindamycin NEGATIVE Sensitive     * RARE  STAPHYLOCOCCUS EPIDERMIDIS  Aerobic/Anaerobic Culture w Gram Stain (surgical/deep wound)     Status: None (Preliminary result)   Collection Time: 10/08/22  4:15 PM   Specimen: Path Tissue  Result Value Ref Range Status   Specimen Description TISSUE  Final   Special Requests RIGHT LATERAL CALF SPEC B  Final   Gram Stain   Final    NO WBC SEEN NO ORGANISMS SEEN Gram Stain Report Called to,Read Back By and Verified With: RN Budd Palmer [phone] @1740  FH Performed at [facility], [address] [place], [place] [address]    Culture   Final    RARE STAPHYLOCOCCUS AUREUS SUSCEPTIBILITIES PERFORMED ON PREVIOUS CULTURE WITHIN THE LAST 5 DAYS. NO ANAEROBES ISOLATED; CULTURE IN PROGRESS FOR 5 DAYS    Report Status PENDING  Incomplete  Aerobic/Anaerobic Culture w Gram Stain (surgical/deep wound)     Status: None (Preliminary result)   Collection Time: 10/08/22  4:19 PM   Specimen: Path Tissue  Result Value Ref Range Status   Specimen Description TISSUE  Final   Special Requests RIGHT FOOT SPEC C  Final   Gram Stain   Final    NO WBC SEEN NO ORGANISMS SEEN Gram Stain Report Called to,Read Back By and Verified With: RN Budd Palmer [phone] @1740  FH Performed at [facility], [address], [place], [place] [address]    Culture   Final    RARE STAPHYLOCOCCUS AUREUS RARE PSEUDOMONAS AERUGINOSA CRITICAL RESULT CALLED TO, READ BACK BY AND VERIFIED WITH: RN [email or web address] AT 1207  ON 10/09/2022 BY T.SAAD. NO ANAEROBES ISOLATED; CULTURE IN PROGRESS FOR 5 DAYS    Report Status PENDING  Incomplete   Organism ID, Bacteria STAPHYLOCOCCUS AUREUS  Final   Organism ID, Bacteria PSEUDOMONAS AERUGINOSA  Final      Susceptibility   Pseudomonas aeruginosa - MIC*    CEFTAZIDIME 4 SENSITIVE Sensitive     CIPROFLOXACIN <=0.25 SENSITIVE Sensitive     GENTAMICIN <=1 SENSITIVE Sensitive     IMIPENEM 1 SENSITIVE Sensitive     PIP/TAZO 16 SENSITIVE Sensitive     CEFEPIME 2 SENSITIVE Sensitive     * RARE  PSEUDOMONAS AERUGINOSA   Staphylococcus aureus - MIC*    CIPROFLOXACIN <=0.5 SENSITIVE Sensitive     ERYTHROMYCIN >=8 RESISTANT Resistant     GENTAMICIN <=0.5 SENSITIVE Sensitive     OXACILLIN 0.5 SENSITIVE Sensitive     TETRACYCLINE >=16 RESISTANT Resistant     VANCOMYCIN <=0.5 SENSITIVE Sensitive     TRIMETH/SULFA <=10 SENSITIVE Sensitive     CLINDAMYCIN >=8 RESISTANT Resistant     RIFAMPIN <=0.5 SENSITIVE Sensitive     Inducible Clindamycin NEGATIVE Sensitive     LINEZOLID 2 SENSITIVE Sensitive     * RARE STAPHYLOCOCCUS AUREUS  Aerobic/Anaerobic Culture w Gram Stain (surgical/deep wound)     Status: None (Preliminary result)   Collection Time: 10/08/22  4:48 PM   Specimen: Path Tissue  Result Value Ref Range Status   Specimen Description TISSUE LEFT THIGH  Final   Special Requests PT ON CEFEPIME,LINEZOLID, FLAGYL  Final   Gram Stain   Final    RARE WBC PRESENT, PREDOMINANTLY PMN NO ORGANISMS SEEN Gram Stain Report Called to,Read Back By and Verified With: RN Budd Palmer [phone] @1740  FH Performed at [facility], [address], [place], [place] [address]    Culture   Final    RARE DIPHTHEROIDS(CORYNEBACTERIUM SPECIES) Standardized susceptibility testing for this organism is not available. NO ANAEROBES ISOLATED; CULTURE IN PROGRESS FOR 5 DAYS    Report Status PENDING  Incomplete     Marcos Eke, NP [facility]  10/13/2022  11:02 AM

## 2022-10-13 NOTE — Progress Notes (Signed)
Ortho Trauma Note  Unfortunately the right thigh wound VAC overnight had significant leakage and bloody output.  A good seal was not able to be obtained.  As result we will need to proceed today for wound VAC change under anesthesia.  I discussed this with the patient and she understands.  Consent was obtained from the mother.  Plan for likely surgical debridement later in the week with Dr. Lajoyce Corners.  Roby Lofts, MD [facility] [phone] (office) [email or web address]

## 2022-10-14 ENCOUNTER — Encounter (HOSPITAL_COMMUNITY): Payer: Self-pay | Admitting: Student

## 2022-10-14 DIAGNOSIS — M726 Necrotizing fasciitis: Secondary | ICD-10-CM | POA: Diagnosis not present

## 2022-10-14 LAB — COMPREHENSIVE METABOLIC PANEL
ALT: 18 U/L (ref 0–44)
AST: 23 U/L (ref 15–41)
Albumin: <1.5 g/dL — ABNORMAL LOW (ref 3.5–5.0)
Alkaline Phosphatase: 193 U/L — ABNORMAL HIGH (ref 38–126)
Anion gap: 7 (ref 5–15)
BUN: 52 mg/dL — ABNORMAL HIGH (ref 6–20)
CO2: 19 mmol/L — ABNORMAL LOW (ref 22–32)
Calcium: 7.7 mg/dL — ABNORMAL LOW (ref 8.9–10.3)
Chloride: 107 mmol/L (ref 98–111)
Creatinine, Ser: 1.74 mg/dL — ABNORMAL HIGH (ref 0.44–1.00)
GFR, Estimated: 40 mL/min — ABNORMAL LOW (ref >60)
Glucose, Bld: 141 mg/dL — ABNORMAL HIGH (ref 70–99)
Potassium: 4.3 mmol/L (ref 3.5–5.1)
Sodium: 133 mmol/L — ABNORMAL LOW (ref 135–145)
Total Bilirubin: 0.4 mg/dL (ref 0.3–1.2)
Total Protein: 4.9 g/dL — ABNORMAL LOW (ref 6.5–8.1)

## 2022-10-14 LAB — GLUCOSE, CAPILLARY
Glucose-Capillary: 60 mg/dL — ABNORMAL LOW (ref 70–99)
Glucose-Capillary: 140 mg/dL — ABNORMAL HIGH (ref 70–99)
Glucose-Capillary: 203 mg/dL — ABNORMAL HIGH (ref 70–99)
Glucose-Capillary: 102 mg/dL — ABNORMAL HIGH (ref 70–99)
Glucose-Capillary: 193 mg/dL — ABNORMAL HIGH (ref 70–99)
Glucose-Capillary: 187 mg/dL — ABNORMAL HIGH (ref 70–99)
Glucose-Capillary: 240 mg/dL — ABNORMAL HIGH (ref 70–99)

## 2022-10-14 LAB — CBC
HCT: 23.9 % — ABNORMAL LOW (ref 36.0–46.0)
Hemoglobin: 7.7 g/dL — ABNORMAL LOW (ref 12.0–15.0)
MCH: 28.5 pg (ref 26.0–34.0)
MCHC: 32.2 g/dL (ref 30.0–36.0)
MCV: 88.5 fL (ref 80.0–100.0)
Platelets: 280 10*3/uL (ref 150–400)
RBC: 2.7 MIL/uL — ABNORMAL LOW (ref 3.87–5.11)
RDW: 17.2 % — ABNORMAL HIGH (ref 11.5–15.5)
WBC: 40.6 10*3/uL — ABNORMAL HIGH (ref 4.0–10.5)
nRBC: 0 % (ref 0.0–0.2)

## 2022-10-14 MED ORDER — COPPER 2 MG PO TABS
4.0000 mg | Freq: Every day | Status: DC
  Administered 2022-10-15: 4 mg
  Filled 2022-10-14: qty 2

## 2022-10-14 MED ORDER — HYDROMORPHONE HCL 1 MG/ML IJ SOLN
1.0000 mg | INTRAMUSCULAR | Status: DC | PRN
  Administered 2022-10-14 – 2022-10-17 (×21): 1 mg via INTRAVENOUS
  Filled 2022-10-14 (×22): qty 1

## 2022-10-14 MED ORDER — NOREPINEPHRINE 4 MG/250ML-% IV SOLN
0.0000 ug/min | INTRAVENOUS | Status: DC
  Administered 2022-10-14: 2 ug/min via INTRAVENOUS

## 2022-10-14 MED ORDER — DEXTROSE 50 % IV SOLN
12.5000 g | INTRAVENOUS | Status: AC
  Administered 2022-10-14: 12.5 g via INTRAVENOUS
  Filled 2022-10-14: qty 50

## 2022-10-14 MED ORDER — ACETAMINOPHEN 500 MG PO TABS
1000.0000 mg | ORAL_TABLET | Freq: Four times a day (QID) | ORAL | Status: DC
  Administered 2022-10-14 – 2022-10-15 (×5): 1000 mg
  Filled 2022-10-14 (×4): qty 2

## 2022-10-14 MED ORDER — OXYCODONE HCL 5 MG PO TABS: 5.0000 mg | ORAL_TABLET | Freq: Four times a day (QID) | ORAL | Status: DC

## 2022-10-14 MED ORDER — ACETAMINOPHEN 500 MG PO TABS: 1000.0000 mg | ORAL_TABLET | Freq: Four times a day (QID) | ORAL | Status: DC

## 2022-10-14 MED ORDER — OXYCODONE HCL 5 MG PO TABS
5.0000 mg | ORAL_TABLET | Freq: Four times a day (QID) | ORAL | Status: DC
  Administered 2022-10-14 – 2022-10-15 (×4): 5 mg
  Filled 2022-10-14 (×4): qty 1

## 2022-10-14 NOTE — Progress Notes (Signed)
NAME:  Kelli Hudson, MRN:  [id number], DOB:  20-Feb-1990, LOS: 11 ADMISSION DATE:  10/03/2022, CONSULTATION DATE:  10/08/22 REFERRING MD:  Lajoyce Corners, CHIEF COMPLAINT: nec fasc   History of Present Illness:   [name] PMH Dm1 diabetic gastroparesis now s/p gastric pacemaker, HTN admitted 8/17 with leg swelling -- BLE foot wounds, which  were being followed outpt by podiatry. Admitted to [facility] for sepsis in this setting, OR with podiatry 8/18 for I&D BLE foot wounds.   ID was consulted 8/20 for ongoing fever. Concern for nec fasc 8/21 ortho consulted -- transferred to [facility], underwent excision and debridement of BLE --extending feet to thighs. EBL from this case is not recorded but pt did receive 4 PRBC 2 FFP as well as crystalloid + colloid, as well as pressors for hemodynamic support .   Remains intubated after case. PCCM consulted for ICU management.   Pertinent  Medical History  DM1 Diabetic gastroparesis S/p gastric pacemaker  HTN  Significant Hospital Events: Including procedures, antibiotic start and stop dates in addition to other pertinent events   8/17 admit to [facility] sepsis BLE wound infections. Podiatry consult 8/18 I&D with podiatry.  8/20 ID consult  8/21 ortho consult. OR for excision and debridement of BLE nec fasc-- feet to thighs. Remained intubated, PCCM consulted.  4 PRBC 2 FFP during case. 8/23-extubated 8/26 Back to OR for wound vac change.   Interim History / Subjective:   Pain improved overnight. Wants water to drink  Objective   Blood pressure (!) 105/50, pulse (!) 109, temperature 98.4 F (36.9 C), resp. rate 14, height 5\' 7"  (1.702 m), weight 106.7 kg, SpO2 96%.        Intake/Output Summary (Last 24 hours) at 10/14/2022 0759 Last data filed at 10/14/2022 0600 Gross per 24 hour  Intake 2314.77 ml  Output 3880 ml  Net -1565.23 ml   Filed Weights   10/11/22 0400 10/12/22 0500 10/14/22 0444  Weight: 103.1 kg 102.7 kg 106.7 kg    Examination: Blood  pressure (!) 105/50, pulse (!) 109, temperature 98.4 F (36.9 C), resp. rate 14, height 5\' 7"  (1.702 m), weight 106.7 kg, SpO2 96%. Gen:      No acute distress HEENT:  EOMI, sclera anicteric Neck:     No masses; no thyromegaly Lungs:    Clear to auscultation bilaterally; normal respiratory effort CV:         Regular rate and rhythm; no murmurs Abd:      + bowel sounds; soft, non-tender; no palpable masses, no distension Ext:    Feet in bandages, bilateral wound vacs up to the thigh. Skin:      Warm and dry; no rash Neuro: alert and oriented x 3  Lab/imaging reviewed Significant for Na 133, BUN/creatinine 52/1.74 WBC slightly better at 40.6, Hb 7.7, Plts 280  Resolved Hospital Problem list     Assessment & Plan:  Bilateral feet abscess, necrotizing fasciitis S/p bilateral excisional leg and thigh debridement Severe sepsis Continue Levophed, wean as tolerated Wound culture reviewed with rare Staph aureus, rare Pseudomonas aeruginosa Continue cefepime, linezolid, Flagyl per ID Dilaudid, oxy PRN for pain control  Acute blood loss anemia due to operative blood loss Monitor CBC.  Transfuse as needed  Type 1 diabetes Uncontrolled diabetes Continue Levemir  Protein calorie malnutrition Continue tube feeds.   History of gastroparesis Status post gastric pacemaker placement Continue Reglan  Hypertension Hyperlipidemia Hold antihypertensive medications  Leukocytosis Antibiotics as above.  Monitor CBC  Remains critically  ill  Best Practice (right click and "Reselect all SmartList Selections" daily)   Diet/type: tubefeeds DVT prophylaxis: prophylactic heparin  GI prophylaxis: PPI Lines: Central line and Arterial Line Foley:  Yes, and it is still needed Code Status:  full code Last date of multidisciplinary goals of care discussion [pending]  Critical care time:   The patient is critically ill with multiple organ system failure and requires high complexity decision  making for assessment and support, frequent evaluation and titration of therapies, advanced monitoring, review of radiographic studies and interpretation of complex data.   Critical Care Time devoted to patient care services, exclusive of separately billable procedures, described in this note is 35 minutes.   [name] MD  Pulmonary & Critical care See Amion for pager  If no response to pager , please call [phone] until 7pm After 7:00 pm call Elink  [phone] 10/14/2022, 7:59 AM

## 2022-10-14 NOTE — Plan of Care (Signed)
  Problem: Education: Goal: Knowledge of General Education information will improve Description: Including pain rating scale, medication(s)/side effects and non-pharmacologic comfort measures Outcome: Progressing   Problem: Health Behavior/Discharge Planning: Goal: Ability to manage health-related needs will improve Outcome: Progressing   Problem: Clinical Measurements: Goal: Ability to maintain clinical measurements within normal limits will improve Outcome: Progressing Goal: Will remain free from infection Outcome: Progressing Goal: Diagnostic test results will improve Outcome: Progressing Goal: Respiratory complications will improve Outcome: Progressing Goal: Cardiovascular complication will be avoided Outcome: Progressing   Problem: Activity: Goal: Risk for activity intolerance will decrease Outcome: Progressing   Problem: Nutrition: Goal: Adequate nutrition will be maintained Outcome: Progressing   Problem: Coping: Goal: Level of anxiety will decrease Outcome: Progressing   Problem: Elimination: Goal: Will not experience complications related to bowel motility Outcome: Progressing Goal: Will not experience complications related to urinary retention Outcome: Progressing   Problem: Pain Managment: Goal: General experience of comfort will improve Outcome: Progressing   Problem: Safety: Goal: Ability to remain free from injury will improve Outcome: Progressing   Problem: Skin Integrity: Goal: Risk for impaired skin integrity will decrease Outcome: Progressing   Problem: Education: Goal: Ability to describe self-care measures that may prevent or decrease complications (Diabetes Survival Skills Education) will improve Outcome: Progressing Goal: Individualized Educational Video(s) Outcome: Progressing   Problem: Coping: Goal: Ability to adjust to condition or change in health will improve Outcome: Progressing   Problem: Fluid Volume: Goal: Ability to  maintain a balanced intake and output will improve Outcome: Progressing   Problem: Health Behavior/Discharge Planning: Goal: Ability to identify and utilize available resources and services will improve Outcome: Progressing Goal: Ability to manage health-related needs will improve Outcome: Progressing   Problem: Metabolic: Goal: Ability to maintain appropriate glucose levels will improve Outcome: Progressing   Problem: Nutritional: Goal: Maintenance of adequate nutrition will improve Outcome: Progressing Goal: Progress toward achieving an optimal weight will improve Outcome: Progressing   Problem: Skin Integrity: Goal: Risk for impaired skin integrity will decrease Outcome: Progressing   Problem: Tissue Perfusion: Goal: Adequacy of tissue perfusion will improve Outcome: Progressing   Problem: Education: Goal: Knowledge of the prescribed therapeutic regimen will improve Outcome: Progressing Goal: Ability to verbalize activity precautions or restrictions will improve Outcome: Progressing Goal: Understanding of discharge needs will improve Outcome: Progressing   Problem: Activity: Goal: Ability to perform//tolerate increased activity and mobilize with assistive devices will improve Outcome: Progressing   Problem: Clinical Measurements: Goal: Postoperative complications will be avoided or minimized Outcome: Progressing   Problem: Self-Care: Goal: Ability to meet self-care needs will improve Outcome: Progressing   Problem: Self-Concept: Goal: Ability to maintain and perform role responsibilities to the fullest extent possible will improve Outcome: Progressing   Problem: Pain Management: Goal: Pain level will decrease with appropriate interventions Outcome: Progressing   Problem: Skin Integrity: Goal: Demonstration of wound healing without infection will improve Outcome: Progressing   Problem: Activity: Goal: Ability to tolerate increased activity will  improve Outcome: Progressing   Problem: Respiratory: Goal: Ability to maintain a clear airway and adequate ventilation will improve Outcome: Progressing   Problem: Role Relationship: Goal: Method of communication will improve Outcome: Progressing

## 2022-10-14 NOTE — Progress Notes (Signed)
Spoke with mother, discussed plan for repeat I&D on Friday with Dr. Lajoyce Corners, the patient's mother is going to be going out of town, and just wanted to make sure that she was informed if any major decisions such as amputation or discussed.  I have counseled her that I do not know the ultimate long-term plan, but that Dr. Lajoyce Corners would be in communication based on his operative plans.  Teryl Lucy, MD

## 2022-10-14 NOTE — Inpatient Diabetes Management (Signed)
Inpatient Diabetes Program Recommendations  AACE/ADA: New Consensus Statement on Inpatient Glycemic Control   Target Ranges:  Prepandial:   less than 140 mg/dL      Peak postprandial:   less than 180 mg/dL (1-2 hours)      Critically ill patients:  140 - 180 mg/dL    Latest Reference Range & Units 10/14/22 03:24 10/14/22 03:53 10/14/22 07:53 10/14/22 11:43  Glucose-Capillary 70 - 99 mg/dL 60 (L) 295 (H) 621 (H) 102 (H)    Latest Reference Range & Units 10/13/22 07:44 10/13/22 11:24 10/13/22 13:21 10/13/22 13:37 10/13/22 14:05 10/13/22 14:29 10/13/22 15:34 10/13/22 16:21 10/13/22 19:54 10/13/22 23:53  Glucose-Capillary 70 - 99 mg/dL 308 (H) 82 63 (L) 79 66 (L) 92 63 (L) 111 (H) 126 (H) 198 (H)   Review of Glycemic Control  Current orders for Inpatient glycemic control: Levemir 5 units BID, Novolog 0-9 units Q4H; Vital @ 60 ml/hr  Inpatient Diabetes Program Recommendations:    Insulin: Please consider decreasing Levemir to 3 units BID and Novolog correction to 0-6 units Q4H.  Thanks, Orlando Penner, RN, MSN, CDCES Diabetes Coordinator Inpatient Diabetes Program [phone] (Team Pager from 8am to 5pm)

## 2022-10-14 NOTE — Progress Notes (Addendum)
Subjective: 1 Day Post-Op s/p Procedure(s): RIGHT THIGH WOUND VAC EXCHANGE  Pain significantly improved since med change overnight. Sleeping on exam this morning.   Objective:  PE: VITALS:   Vitals:   10/14/22 0515 10/14/22 0530 10/14/22 0545 10/14/22 0600  BP:      Pulse: 92 97 99 95  Resp: 10 11 12 11   Temp: 97.9 F (36.6 C) 97.9 F (36.6 C) 97.9 F (36.6 C) 98.1 F (36.7 C)  TempSrc:      SpO2: 96% 97% 99% 95%  Weight:      Height:       RLE     Vac dressing intact, clean, dry             Edema/ swelling moderate No significant erythema Patient does not indicate pain to palpation to thigh or lower leg, no fluctuance              Brisk cap refill, warm to touch LLE      Vac dressing intact, clean, dry  No significant erythema Patient does not indicate pain to palpation to thigh or lower leg, no fluctuance  Tender right thigh             Brisk cap refill, warm to touch Large foot dressings in place bilaterally Canisters have blood tinged serous fluid - 250-350 cc's fluid in all 3 canisters  LABS  Results for orders placed or performed during the hospital encounter of 10/03/22 (from the past 24 hour(s))  Glucose, capillary     Status: Abnormal   Collection Time: 10/13/22  7:44 AM  Result Value Ref Range   Glucose-Capillary 157 (H) 70 - 99 mg/dL  Glucose, capillary     Status: None   Collection Time: 10/13/22 11:24 AM  Result Value Ref Range   Glucose-Capillary 82 70 - 99 mg/dL  Glucose, capillary     Status: Abnormal   Collection Time: 10/13/22  1:21 PM  Result Value Ref Range   Glucose-Capillary 63 (L) 70 - 99 mg/dL  Glucose, capillary     Status: None   Collection Time: 10/13/22  1:37 PM  Result Value Ref Range   Glucose-Capillary 79 70 - 99 mg/dL  Glucose, capillary     Status: Abnormal   Collection Time: 10/13/22  2:05 PM  Result Value Ref Range   Glucose-Capillary 66 (L) 70 - 99 mg/dL  Glucose, capillary     Status: None   Collection Time:  10/13/22  2:29 PM  Result Value Ref Range   Glucose-Capillary 92 70 - 99 mg/dL  Glucose, capillary     Status: Abnormal   Collection Time: 10/13/22  3:34 PM  Result Value Ref Range   Glucose-Capillary 63 (L) 70 - 99 mg/dL  Glucose, capillary     Status: Abnormal   Collection Time: 10/13/22  4:21 PM  Result Value Ref Range   Glucose-Capillary 111 (H) 70 - 99 mg/dL  Glucose, capillary     Status: Abnormal   Collection Time: 10/13/22  7:54 PM  Result Value Ref Range   Glucose-Capillary 126 (H) 70 - 99 mg/dL   Comment 1 Notify RN    Comment 2 Document in Chart   Glucose, capillary     Status: Abnormal   Collection Time: 10/13/22 11:53 PM  Result Value Ref Range   Glucose-Capillary 198 (H) 70 - 99 mg/dL   Comment 1 Notify RN    Comment 2 Document in Chart   Glucose, capillary  Status: Abnormal   Collection Time: 10/14/22  3:24 AM  Result Value Ref Range   Glucose-Capillary 60 (L) 70 - 99 mg/dL  CBC     Status: Abnormal   Collection Time: 10/14/22  3:25 AM  Result Value Ref Range   WBC 40.6 (H) 4.0 - 10.5 K/uL   RBC 2.70 (L) 3.87 - 5.11 MIL/uL   Hemoglobin 7.7 (L) 12.0 - 15.0 g/dL   HCT 40.9 (L) 81.1 - 91.4 %   MCV 88.5 80.0 - 100.0 fL   MCH 28.5 26.0 - 34.0 pg   MCHC 32.2 30.0 - 36.0 g/dL   RDW 78.2 (H) 95.6 - 21.3 %   Platelets 280 150 - 400 K/uL   nRBC 0.0 0.0 - 0.2 %  Comprehensive metabolic panel     Status: Abnormal   Collection Time: 10/14/22  3:25 AM  Result Value Ref Range   Sodium 133 (L) 135 - 145 mmol/L   Potassium 4.3 3.5 - 5.1 mmol/L   Chloride 107 98 - 111 mmol/L   CO2 19 (L) 22 - 32 mmol/L   Glucose, Bld 141 (H) 70 - 99 mg/dL   BUN 52 (H) 6 - 20 mg/dL   Creatinine, Ser 0.86 (H) 0.44 - 1.00 mg/dL   Calcium 7.7 (L) 8.9 - 10.3 mg/dL   Total Protein 4.9 (L) 6.5 - 8.1 g/dL   Albumin <5.7 (L) 3.5 - 5.0 g/dL   AST 23 15 - 41 U/L   ALT 18 0 - 44 U/L   Alkaline Phosphatase 193 (H) 38 - 126 U/L   Total Bilirubin 0.4 0.3 - 1.2 mg/dL   GFR, Estimated 40 (L)  >60 mL/min   Anion gap 7 5 - 15  Glucose, capillary     Status: Abnormal   Collection Time: 10/14/22  3:53 AM  Result Value Ref Range   Glucose-Capillary 140 (H) 70 - 99 mg/dL    PERIPHERAL VASCULAR CATHETERIZATION  Result Date: 10/12/2022 See surgical note for result.   Assessment/Plan: Necrotizing fasciitis bilateral thighs, bilateral legs, bilateral feet S/p bilateral leg excisional debridement and wound vac placement with Dr. Lajoyce Corners 8/21 S/p bilateral leg excisional debridement and wound vac placement with Dr. Carola Frost 8/25 S/p debridement of right thigh with wound vac changed with Dr. Jena Gauss 8/26  - wound vacs with good seal today - continue IV antibiotics per ID - DVT prophylaxis heparin restarted - continue current plan for repeat debridement with Dr. Lajoyce Corners on Friday - following, please contact Dr. Teryl Lucy or me for any orthopedic questions or concerns this week until repeat debridement with Dr. Lajoyce Corners on Friday.  Contact information:   Janine Ores, Cordelia Poche [facility] 8-5  After hours and holidays please check [email or web address] for group call information for [facility]  Armida Sans 10/14/2022, 7:11 AM

## 2022-10-14 NOTE — Progress Notes (Signed)
Orthopaedic Trauma Progress Note  SUBJECTIVE: Doing ok this morning, currently resting comfortably. No issues with wound vac overnight. No chest pain. No SOB. No nausea/vomiting. No other complaints.   OBJECTIVE:  Vitals:   10/14/22 0800 10/14/22 0813  BP:    Pulse: 96 97  Resp:    Temp: 98.4 F (36.9 C) 98.4 F (36.9 C)  SpO2: 98% 100%    General: Resting comfortably, NAD. Arousable Respiratory: No increased work of breathing.  BLE: Wound vacs functioning well. Notable serosanguinous output in all canisters. Swelling stable. +DP pulse bilaterally   LABS:  Results for orders placed or performed during the hospital encounter of 10/03/22 (from the past 24 hour(s))  Glucose, capillary     Status: None   Collection Time: 10/13/22 11:24 AM  Result Value Ref Range   Glucose-Capillary 82 70 - 99 mg/dL  Glucose, capillary     Status: Abnormal   Collection Time: 10/13/22  1:21 PM  Result Value Ref Range   Glucose-Capillary 63 (L) 70 - 99 mg/dL  Glucose, capillary     Status: None   Collection Time: 10/13/22  1:37 PM  Result Value Ref Range   Glucose-Capillary 79 70 - 99 mg/dL  Glucose, capillary     Status: Abnormal   Collection Time: 10/13/22  2:05 PM  Result Value Ref Range   Glucose-Capillary 66 (L) 70 - 99 mg/dL  Glucose, capillary     Status: None   Collection Time: 10/13/22  2:29 PM  Result Value Ref Range   Glucose-Capillary 92 70 - 99 mg/dL  Glucose, capillary     Status: Abnormal   Collection Time: 10/13/22  3:34 PM  Result Value Ref Range   Glucose-Capillary 63 (L) 70 - 99 mg/dL  Glucose, capillary     Status: Abnormal   Collection Time: 10/13/22  4:21 PM  Result Value Ref Range   Glucose-Capillary 111 (H) 70 - 99 mg/dL  Glucose, capillary     Status: Abnormal   Collection Time: 10/13/22  7:54 PM  Result Value Ref Range   Glucose-Capillary 126 (H) 70 - 99 mg/dL   Comment 1 Notify RN    Comment 2 Document in Chart   Glucose, capillary     Status: Abnormal    Collection Time: 10/13/22 11:53 PM  Result Value Ref Range   Glucose-Capillary 198 (H) 70 - 99 mg/dL   Comment 1 Notify RN    Comment 2 Document in Chart   Glucose, capillary     Status: Abnormal   Collection Time: 10/14/22  3:24 AM  Result Value Ref Range   Glucose-Capillary 60 (L) 70 - 99 mg/dL  CBC     Status: Abnormal   Collection Time: 10/14/22  3:25 AM  Result Value Ref Range   WBC 40.6 (H) 4.0 - 10.5 K/uL   RBC 2.70 (L) 3.87 - 5.11 MIL/uL   Hemoglobin 7.7 (L) 12.0 - 15.0 g/dL   HCT 01.0 (L) 27.2 - 53.6 %   MCV 88.5 80.0 - 100.0 fL   MCH 28.5 26.0 - 34.0 pg   MCHC 32.2 30.0 - 36.0 g/dL   RDW 64.4 (H) 03.4 - 74.2 %   Platelets 280 150 - 400 K/uL   nRBC 0.0 0.0 - 0.2 %  Comprehensive metabolic panel     Status: Abnormal   Collection Time: 10/14/22  3:25 AM  Result Value Ref Range   Sodium 133 (L) 135 - 145 mmol/L   Potassium 4.3 3.5 - 5.1 mmol/L  Chloride 107 98 - 111 mmol/L   CO2 19 (L) 22 - 32 mmol/L   Glucose, Bld 141 (H) 70 - 99 mg/dL   BUN 52 (H) 6 - 20 mg/dL   Creatinine, Ser 4.09 (H) 0.44 - 1.00 mg/dL   Calcium 7.7 (L) 8.9 - 10.3 mg/dL   Total Protein 4.9 (L) 6.5 - 8.1 g/dL   Albumin <8.1 (L) 3.5 - 5.0 g/dL   AST 23 15 - 41 U/L   ALT 18 0 - 44 U/L   Alkaline Phosphatase 193 (H) 38 - 126 U/L   Total Bilirubin 0.4 0.3 - 1.2 mg/dL   GFR, Estimated 40 (L) >60 mL/min   Anion gap 7 5 - 15  Glucose, capillary     Status: Abnormal   Collection Time: 10/14/22  3:53 AM  Result Value Ref Range   Glucose-Capillary 140 (H) 70 - 99 mg/dL  Glucose, capillary     Status: Abnormal   Collection Time: 10/14/22  7:53 AM  Result Value Ref Range   Glucose-Capillary 203 (H) 70 - 99 mg/dL    ASSESSMENT: Kelli Hudson is a 32 y.o. female, 1 Day Post-Op s/p RIGHT THIGH WOUND VAC EXCHANGE  CV/Blood loss: Acute blood loss anemia, Hgb 7.7 this AM. Hemodynamically stable  PLAN: Weightbearing: NWB RLE ROM:  Ok for motion as tolerated  Incisional and dressing care:  Reinforce dressings as needed  Showering: Hold off Orthopedic device(s): Wound Vac:BLE   Pain management: Continue current regimen VTE prophylaxis:  Heparin , SCDs ID: per ID  Foley/Lines:  No foley, KVO IVFs  Dispo: Continue IV abx per critical care/ID team. The patient will return under the care of Dr. Dion Saucier. Please reach out to him with additional questions/concerns   Follow - up plan:  TBD by Dr. Lollie Sails. Lajoyce Corners   Contact information:  Truitt Merle MD, Thyra Breed PA-C. After hours and holidays please check [email or web address] for group call information for [facility]   Thompson Caul, PA-C [phone] (office) [email or web address]

## 2022-10-15 DIAGNOSIS — M726 Necrotizing fasciitis: Secondary | ICD-10-CM | POA: Diagnosis not present

## 2022-10-15 LAB — GLUCOSE, CAPILLARY
Glucose-Capillary: 157 mg/dL — ABNORMAL HIGH (ref 70–99)
Glucose-Capillary: 204 mg/dL — ABNORMAL HIGH (ref 70–99)
Glucose-Capillary: 238 mg/dL — ABNORMAL HIGH (ref 70–99)
Glucose-Capillary: 262 mg/dL — ABNORMAL HIGH (ref 70–99)
Glucose-Capillary: 282 mg/dL — ABNORMAL HIGH (ref 70–99)
Glucose-Capillary: 65 mg/dL — ABNORMAL LOW (ref 70–99)
Glucose-Capillary: 77 mg/dL (ref 70–99)
Glucose-Capillary: 82 mg/dL (ref 70–99)
Glucose-Capillary: 83 mg/dL (ref 70–99)

## 2022-10-15 LAB — CBC
HCT: 21.7 % — ABNORMAL LOW (ref 36.0–46.0)
Hemoglobin: 7 g/dL — ABNORMAL LOW (ref 12.0–15.0)
MCH: 28.8 pg (ref 26.0–34.0)
MCHC: 32.3 g/dL (ref 30.0–36.0)
MCV: 89.3 fL (ref 80.0–100.0)
Platelets: 319 10*3/uL (ref 150–400)
RBC: 2.43 MIL/uL — ABNORMAL LOW (ref 3.87–5.11)
RDW: 17.2 % — ABNORMAL HIGH (ref 11.5–15.5)
WBC: 41.2 10*3/uL — ABNORMAL HIGH (ref 4.0–10.5)
nRBC: 0.1 % (ref 0.0–0.2)

## 2022-10-15 LAB — BASIC METABOLIC PANEL
Anion gap: 9 (ref 5–15)
BUN: 59 mg/dL — ABNORMAL HIGH (ref 6–20)
CO2: 19 mmol/L — ABNORMAL LOW (ref 22–32)
Calcium: 7.7 mg/dL — ABNORMAL LOW (ref 8.9–10.3)
Chloride: 107 mmol/L (ref 98–111)
Creatinine, Ser: 1.9 mg/dL — ABNORMAL HIGH (ref 0.44–1.00)
GFR, Estimated: 36 mL/min — ABNORMAL LOW (ref >60)
Glucose, Bld: 234 mg/dL — ABNORMAL HIGH (ref 70–99)
Potassium: 4.3 mmol/L (ref 3.5–5.1)
Sodium: 135 mmol/L (ref 135–145)

## 2022-10-15 LAB — TRIGLYCERIDES: Triglycerides: 77 mg/dL (ref <150)

## 2022-10-15 MED ORDER — THIAMINE MONONITRATE 100 MG PO TABS
100.0000 mg | ORAL_TABLET | Freq: Every day | ORAL | Status: AC
  Administered 2022-10-16: 100 mg via ORAL
  Filled 2022-10-15: qty 1

## 2022-10-15 MED ORDER — ACETAMINOPHEN 500 MG PO TABS
1000.0000 mg | ORAL_TABLET | Freq: Four times a day (QID) | ORAL | Status: DC
  Administered 2022-10-15 – 2022-10-21 (×22): 1000 mg via ORAL
  Filled 2022-10-15 (×22): qty 2

## 2022-10-15 MED ORDER — INSULIN ASPART 100 UNIT/ML IJ SOLN
0.0000 [IU] | INTRAMUSCULAR | Status: DC
  Administered 2022-10-15: 2 [IU] via SUBCUTANEOUS
  Administered 2022-10-15 (×2): 8 [IU] via SUBCUTANEOUS
  Administered 2022-10-16 – 2022-10-18 (×5): 2 [IU] via SUBCUTANEOUS
  Administered 2022-10-18: 3 [IU] via SUBCUTANEOUS
  Administered 2022-10-18: 5 [IU] via SUBCUTANEOUS
  Administered 2022-10-19 (×4): 2 [IU] via SUBCUTANEOUS
  Administered 2022-10-19 – 2022-10-20 (×2): 3 [IU] via SUBCUTANEOUS
  Administered 2022-10-20 (×2): 2 [IU] via SUBCUTANEOUS
  Administered 2022-10-21: 3 [IU] via SUBCUTANEOUS
  Administered 2022-10-21 (×4): 2 [IU] via SUBCUTANEOUS
  Administered 2022-10-21: 3 [IU] via SUBCUTANEOUS
  Administered 2022-10-22 (×2): 2 [IU] via SUBCUTANEOUS
  Administered 2022-10-22 – 2022-10-23 (×2): 3 [IU] via SUBCUTANEOUS
  Administered 2022-10-23: 5 [IU] via SUBCUTANEOUS
  Administered 2022-10-23: 3 [IU] via SUBCUTANEOUS
  Administered 2022-10-23: 5 [IU] via SUBCUTANEOUS
  Administered 2022-10-23: 2 [IU] via SUBCUTANEOUS
  Administered 2022-10-24: 5 [IU] via SUBCUTANEOUS
  Administered 2022-10-24 (×2): 2 [IU] via SUBCUTANEOUS
  Administered 2022-10-24 – 2022-10-25 (×2): 3 [IU] via SUBCUTANEOUS
  Administered 2022-10-25: 5 [IU] via SUBCUTANEOUS
  Administered 2022-10-25 (×2): 3 [IU] via SUBCUTANEOUS
  Administered 2022-10-26: 2 [IU] via SUBCUTANEOUS
  Administered 2022-10-26: 8 [IU] via SUBCUTANEOUS
  Administered 2022-10-26: 5 [IU] via SUBCUTANEOUS
  Administered 2022-10-26 (×2): 2 [IU] via SUBCUTANEOUS
  Administered 2022-10-27: 3 [IU] via SUBCUTANEOUS
  Administered 2022-10-27 – 2022-10-28 (×5): 2 [IU] via SUBCUTANEOUS
  Administered 2022-10-28: 3 [IU] via SUBCUTANEOUS

## 2022-10-15 MED ORDER — PANTOPRAZOLE SODIUM 40 MG PO TBEC
40.0000 mg | DELAYED_RELEASE_TABLET | Freq: Two times a day (BID) | ORAL | Status: DC
  Administered 2022-10-15: 40 mg via ORAL
  Filled 2022-10-15 (×2): qty 1

## 2022-10-15 MED ORDER — ACETAMINOPHEN 325 MG PO TABS
650.0000 mg | ORAL_TABLET | Freq: Four times a day (QID) | ORAL | Status: DC | PRN
  Filled 2022-10-15: qty 2

## 2022-10-15 MED ORDER — METRONIDAZOLE 500 MG PO TABS
500.0000 mg | ORAL_TABLET | Freq: Two times a day (BID) | ORAL | Status: DC
  Administered 2022-10-15 – 2022-10-19 (×8): 500 mg via ORAL
  Filled 2022-10-15 (×8): qty 1

## 2022-10-15 MED ORDER — COPPER 2 MG PO TABS
4.0000 mg | Freq: Every day | Status: DC
  Administered 2022-10-16 – 2022-10-20 (×5): 4 mg via ORAL
  Filled 2022-10-15 (×6): qty 2

## 2022-10-15 MED ORDER — OXYCODONE HCL 5 MG PO TABS
5.0000 mg | ORAL_TABLET | Freq: Four times a day (QID) | ORAL | Status: DC
  Administered 2022-10-15 – 2022-10-16 (×2): 5 mg via ORAL
  Filled 2022-10-15 (×2): qty 1

## 2022-10-15 MED ORDER — VITAMIN C 500 MG PO TABS
500.0000 mg | ORAL_TABLET | Freq: Every day | ORAL | Status: DC
  Administered 2022-10-16 – 2022-10-20 (×5): 500 mg via ORAL
  Filled 2022-10-15 (×7): qty 1

## 2022-10-15 MED ORDER — FOLIC ACID 1 MG PO TABS
1.0000 mg | ORAL_TABLET | Freq: Every day | ORAL | Status: DC
  Administered 2022-10-16 – 2022-10-20 (×5): 1 mg via ORAL
  Filled 2022-10-15 (×6): qty 1

## 2022-10-15 MED ORDER — POLYETHYLENE GLYCOL 3350 17 G PO PACK: 17.0000 g | PACK | Freq: Every day | ORAL | Status: DC | PRN

## 2022-10-15 MED ORDER — DEXTROSE 50 % IV SOLN
12.5000 g | INTRAVENOUS | Status: AC
  Administered 2022-10-15: 12.5 g via INTRAVENOUS
  Filled 2022-10-15: qty 50

## 2022-10-15 MED ORDER — GUAIFENESIN-DM 100-10 MG/5ML PO SYRP: 15.0000 mL | ORAL_SOLUTION | ORAL | Status: DC | PRN

## 2022-10-15 MED ORDER — INSULIN ASPART 100 UNIT/ML IJ SOLN
2.0000 [IU] | INTRAMUSCULAR | Status: DC
  Administered 2022-10-15: 2 [IU] via SUBCUTANEOUS

## 2022-10-15 MED ORDER — METHOCARBAMOL 500 MG PO TABS
1000.0000 mg | ORAL_TABLET | Freq: Three times a day (TID) | ORAL | Status: DC
  Administered 2022-10-15 – 2022-10-20 (×15): 1000 mg via ORAL
  Filled 2022-10-15 (×16): qty 2

## 2022-10-15 MED ORDER — ZINC SULFATE 220 (50 ZN) MG PO CAPS
220.0000 mg | ORAL_CAPSULE | Freq: Every day | ORAL | Status: DC
  Administered 2022-10-16 – 2022-10-20 (×5): 220 mg via ORAL
  Filled 2022-10-15 (×6): qty 1

## 2022-10-15 MED ORDER — LINEZOLID 600 MG PO TABS
600.0000 mg | ORAL_TABLET | Freq: Two times a day (BID) | ORAL | Status: DC
  Administered 2022-10-15 – 2022-10-19 (×8): 600 mg via ORAL
  Filled 2022-10-15 (×8): qty 1

## 2022-10-15 MED ORDER — GABAPENTIN 300 MG PO CAPS
300.0000 mg | ORAL_CAPSULE | Freq: Three times a day (TID) | ORAL | Status: DC
  Administered 2022-10-15 – 2022-10-21 (×16): 300 mg via ORAL
  Filled 2022-10-15 (×16): qty 1

## 2022-10-15 MED ORDER — METOCLOPRAMIDE HCL 10 MG PO TABS: 10.0000 mg | ORAL_TABLET | Freq: Three times a day (TID) | ORAL | Status: DC | PRN

## 2022-10-15 MED ORDER — LACTATED RINGERS IV BOLUS
500.0000 mL | Freq: Once | INTRAVENOUS | Status: AC
  Administered 2022-10-15: 500 mL via INTRAVENOUS

## 2022-10-15 MED ORDER — DEXTROSE 10 % IV SOLN: INTRAVENOUS | Status: DC

## 2022-10-15 MED ORDER — ADULT MULTIVITAMIN W/MINERALS CH
1.0000 | ORAL_TABLET | Freq: Every day | ORAL | Status: DC
  Administered 2022-10-16 – 2022-10-20 (×5): 1 via ORAL
  Filled 2022-10-15 (×6): qty 1

## 2022-10-15 NOTE — Progress Notes (Signed)
Informed of patient's expression of SI. This RN and Corentheia, RN went to patient's room to speak with patient. Suicide risk screening was completed and is in chart. Elink notified of high-risk category on suicide screening. All items removed from patient's room and steps taken to obtain sitter. Patient is safe with stable vitals at this time. Emotional support and comfort given to patient.

## 2022-10-15 NOTE — Progress Notes (Addendum)
NAME:  Kelli Hudson, MRN:  [id number], DOB:  1990-03-18, LOS: 12 ADMISSION DATE:  10/03/2022, CONSULTATION DATE:  10/08/22 REFERRING MD:  Lajoyce Corners, CHIEF COMPLAINT: nec fasc   History of Present Illness:   [name] PMH Dm1 diabetic gastroparesis now s/p gastric pacemaker, HTN admitted 8/17 with leg swelling -- BLE foot wounds, which  were being followed outpt by podiatry. Admitted to [facility] for sepsis in this setting, OR with podiatry 8/18 for I&D BLE foot wounds.   ID was consulted 8/20 for ongoing fever. Concern for nec fasc 8/21 ortho consulted -- transferred to [facility], underwent excision and debridement of BLE --extending feet to thighs. EBL from this case is not recorded but pt did receive 4 PRBC 2 FFP as well as crystalloid + colloid, as well as pressors for hemodynamic support .   Remains intubated after case. PCCM consulted for ICU management.   Pertinent  Medical History  DM1 Diabetic gastroparesis S/p gastric pacemaker  HTN  Significant Hospital Events: Including procedures, antibiotic start and stop dates in addition to other pertinent events   8/17 admit to [facility] sepsis BLE wound infections. Podiatry consult 8/18 I&D with podiatry.  8/20 ID consult  8/21 ortho consult. OR for excision and debridement of BLE nec fasc-- feet to thighs. Remained intubated, PCCM consulted.  4 PRBC 2 FFP during case. 8/23-extubated 8/26 Back to OR for wound vac change.   Interim History / Subjective:   No acute events overnight. On and off pressors  Objective   Blood pressure (!) 169/74, pulse 98, temperature 98.8 F (37.1 C), resp. rate 12, height 5\' 7"  (1.702 m), weight 106.7 kg, SpO2 98%.        Intake/Output Summary (Last 24 hours) at 10/15/2022 0828 Last data filed at 10/15/2022 0700 Gross per 24 hour  Intake 2125.35 ml  Output 4100 ml  Net -1974.65 ml   Filed Weights   10/11/22 0400 10/12/22 0500 10/14/22 0444  Weight: 103.1 kg 102.7 kg 106.7 kg    Examination: Gen:      No  acute distress HEENT:  EOMI, sclera anicteric Neck:     No masses; no thyromegaly Lungs:    Clear to auscultation bilaterally; normal respiratory effort CV:         Regular rate and rhythm; no murmurs Abd:      + bowel sounds; soft, non-tender; no palpable masses, no distension Ext:    Feet in bandages, bilateral wound vacs up to the thigh. Skin:      Warm and dry; no rash Neuro: Somnolent but wakes up and responds appropriately  Lab/imaging reviewed Significant for sodium 135, BUN/creatinine 59/1.90 WBC 41.2, hemoglobin 7.0, platelets 319  Resolved Hospital Problem list     Assessment & Plan:  Bilateral feet abscess, necrotizing fasciitis S/p bilateral excisional leg and thigh debridement Severe sepsis Continue cefepime, linezolid, Flagyl per ID Dilaudid PRN, oxy for pain control  Acute blood loss anemia due to operative blood loss Monitor CBC.  Transfuse as needed  Type 1 diabetes Uncontrolled diabetes Continue Levemir Change SSI to moderate scale Add tube feed coverage  Protein calorie malnutrition Continue tube feeds.   History of gastroparesis Status post gastric pacemaker placement Continue Reglan  Hypertension Hyperlipidemia Hold antihypertensive medications  Leukocytosis Antibiotics as above.  Monitor CBC  Remains critically ill  Best Practice (right click and "Reselect all SmartList Selections" daily)   Diet/type: tubefeeds DVT prophylaxis: prophylactic heparin  GI prophylaxis: PPI Lines: Central line and Arterial Line Foley:  Yes, and it is still needed Code Status:  full code Last date of multidisciplinary goals of care discussion [pending]  Critical care time:   The patient is critically ill with multiple organ system failure and requires high complexity decision making for assessment and support, frequent evaluation and titration of therapies, advanced monitoring, review of radiographic studies and interpretation of complex data.   Critical  Care Time devoted to patient care services, exclusive of separately billable procedures, described in this note is 35 minutes.   [name] MD [facility] See Amion for pager  If no response to pager , please call [phone] until 7pm After 7:00 pm call Elink  [phone] 10/15/2022, 8:28 AM

## 2022-10-15 NOTE — Progress Notes (Signed)
Patient's mother called at number listed and notified of patient's fever, episodes of emesis and tachycardia with update on plan of care. Mother expressed thanks for notification.

## 2022-10-15 NOTE — Progress Notes (Signed)
Patient had two episodes of vomiting, first episode with brown and yellow chunky emesis and second with frothy white emesis. PRN zofran administered and tube feeds paused at 1609. MD notified and in agreement with pausing tube feeds for now. MD also notified in rise in oral temperature to 100.2 and increasing HR up to 120's and patient already receiving scheduled tylenol. Foley cath to be exchanged at this time.

## 2022-10-15 NOTE — Progress Notes (Signed)
Chaplain responded to call from pt's nurse to visit and pray per pr's request.  Chaplain found pt despondent, teary, reluctant to speak and then only in a soft voice.  Chaplain held pt's hand when she reached out.  Chaplain offered ministry of presence as pt spoke softly about her legs which have an infection. How she's had this so long she is "ready to quit, give up."  Pt even used word "suicide."  Chaplain asked what would push her do that.  Pt didn't answer.  Trying to draw pt out Chaplain mentioned the nurse said she had a rough time when her mother was here earlier.  Pt spoke of fussing with her mother which she is ashamed of doing.  Chaplain asked if pt had spoken with  mother since the incident.  And would she like to call her mom tonight.  Pt said her mother has clients and is busy at night. In further discussion pt said her mother will come back to visit.  Pt said her mother means everything to her.  Chaplain prayed for pt.  Chaplain reported to pt's nurse the words of quitting, giving up and suicide.    Chaplain will refer to day Chaplain for follow up visit.    Vernell Morgans Chaplain

## 2022-10-15 NOTE — Progress Notes (Signed)
Pharmacy Antibiotic Note  Kelli Hudson is a 32 y.o. female admitted on 10/03/2022 with sepsis and cellulitis.  Pharmacy has been consulted for linezolid, metronidazole, cefepime dosing. Renal function has been unstable, currently being dosed for estimated CrCl of 53.9 mL/min with Scr of 1.9 mg/dL. Currently without source control so duration of therapy is to be determined.  8/21 bilateral debridement 8/26 wound vac change 8/30 planned for repeat surgical intervention  Plan: Cefepime 2 g every 12 hours Linezolid 600 mg every 12 hours Metronidazole 500 mg every 12 hours  Height: 5\' 7"  (170.2 cm) Weight: 106.7 kg (235 lb 3.7 oz) IBW/kg (Calculated) : 61.6  Temp (24hrs), Avg:98.8 F (37.1 C), Min:98.1 F (36.7 C), Max:99.5 F (37.5 C)  Recent Labs  Lab 10/12/22 0223 10/12/22 1732 10/12/22 2206 10/13/22 0325 10/14/22 0325 10/15/22 0524  WBC 42.1* 46.4* 42.8*  --  40.6* 41.2*  CREATININE 2.19* 1.96*  --  1.96* 1.74* 1.90*    Estimated Creatinine Clearance: 53.9 mL/min (A) (by C-G formula based on SCr of 1.9 mg/dL (H)).    Allergies  Allergen Reactions   Ibuprofen Other (See Comments)    07/23/22 - Worsening kidney function after 3 doses of ibuprofen 400mg .  Creatinine increased from 1.3 to 2.  Creatinine returned to normal after stopping.   Lisinopril Cough, Other (See Comments), Itching and Rash    Breakout in rash    Antimicrobials this admission: Outpt Doxy 8/8 x 10 days Cefepime 8/16 > Vancomycin 8/16 > 8/21 Flagyl 8/16>> Zyvox 8/20 >> 8/24; 8/25 >>  Microbiology results: 8/16 Bcx: NGF 8/16 resp panel: negative 8/18 R Foot Wound: ngtd 8/21 R calf tissue: staph aureus, MRSE 8/21 R foot tissue: MSSA, PsA (pan-susceptible)  Thank you for allowing pharmacy to be a part of this patient's care.  Rutherford Nail, PharmD PGY2 Critical Care Pharmacy Resident 10/15/2022 3:00 PM

## 2022-10-15 NOTE — Evaluation (Signed)
Clinical/Bedside Swallow Evaluation Patient Details  Name: Kelli Hudson MRN: [id number] Date of Birth: 12/10/1990  Today's Date: 10/15/2022 Time: SLP Start Time (ACUTE ONLY): 1000 SLP Stop Time (ACUTE ONLY): 1010 SLP Time Calculation (min) (ACUTE ONLY): 10 min  Past Medical History:  Past Medical History:  Diagnosis Date   Acute H. 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   Procedure: BILATERAL LEG DEBRIDEMENT;  Surgeon: Nadara Mustard, MD;  Location: [facility] OR;  Service: Orthopedics;  Laterality: Bilateral;   AMPUTATION TOE Left 03/10/2018   Procedure: AMPUTATION FIFTH TOE;  Surgeon: Vivi Barrack, DPM;  Location: [facility];  Service: Podiatry;  Laterality: Left;   APPLICATION OF WOUND VAC Bilateral 10/12/2022   Procedure: APPLICATION OF WOUND VAC BILATERAL LEGS;  Surgeon: Myrene Galas, MD;  Location: [facility] OR;  Service: Orthopedics;  Laterality: Bilateral;   BIOPSY  01/28/2020   Procedure: BIOPSY;  Surgeon: Kathi Der, MD;  Location: [facility];  Service: Gastroenterology;;   BOTOX INJECTION  08/26/2021   Procedure: BOTOX INJECTION;  Surgeon: Sherrilyn Rist, MD;  Location: [facility];  Service: Gastroenterology;;   ESOPHAGOGASTRODUODENOSCOPY N/A 01/28/2020   Procedure: ESOPHAGOGASTRODUODENOSCOPY (EGD);  Surgeon: Kathi Der, MD;  Location: [facility];  Service: Gastroenterology;  Laterality: N/A;   ESOPHAGOGASTRODUODENOSCOPY (EGD) WITH PROPOFOL Left 09/08/2015   Procedure: ESOPHAGOGASTRODUODENOSCOPY (EGD) WITH  PROPOFOL;  Surgeon: Willis Modena, MD;  Location: [facility] ENDOSCOPY;  Service: Endoscopy;  Laterality: Left;   ESOPHAGOGASTRODUODENOSCOPY (EGD) WITH PROPOFOL N/A 08/26/2021   Procedure: ESOPHAGOGASTRODUODENOSCOPY (EGD) WITH PROPOFOL;  Surgeon: Sherrilyn Rist, MD;  Location: [facility];  Service: Gastroenterology;  Laterality: N/A;   GASTRIC STIMULATOR IMPLANT SURGERY  06/2022   at [facility]   I & D EXTREMITY Bilateral 10/12/2022   Procedure: IRRIGATION AND  DEBRIDEMENT  BILATERAL LEGS;  Surgeon: Myrene Galas, MD;  Location: [facility] OR;  Service: Orthopedics;  Laterality: Bilateral;   I & D EXTREMITY Right 10/13/2022   Procedure: RIGHT THIGH WOUND VAC EXCHANGE;  Surgeon: Roby Lofts, MD;  Location: [facility] OR;  Service: Orthopedics;  Laterality: Right;   IRRIGATION AND DEBRIDEMENT FOOT Bilateral 10/05/2022   Procedure: IRRIGATION AND DEBRIDEMENT FOOT;  Surgeon: [name], DPM;  Location: [facility];  Service: Orthopedics/Podiatry;  Laterality: Bilateral;   WISDOM TOOTH EXTRACTION     HPI:  [name] PMH Dm1 diabetic gastroparesis now s/p gastric pacemaker, HTN admitted 8/17 with leg swelling -- BLE foot wounds, which  were being followed outpt by podiatry. Admitted to [facility] for sepsis in this setting, OR with podiatry 8/18 for I&D BLE foot wounds.    ID was consulted 8/20 for ongoing fever. Concern for nec fasc  8/21 ortho consulted -- transferred to [facility], underwent excision and debridement of BLE --extending feet to thighs. Intubated from 8/21-8/23. Protein energy malnutrition, history of gastroparesis causing poor p.o. intake, baseline protein status very poor.    Assessment / Plan / Recommendation  Clinical Impression  Pt demonstrates no signs of acute dysphagia. She is able to drink consecutively without  coughing. Able to masticate solids normally. Recommend Regular/thin but medically MD orders full liquids. Will sign off. SLP Visit Diagnosis: Dysphagia, unspecified (R13.10)    Aspiration Risk       Diet  Recommendation Regular;Thin liquid    Liquid Administration via: Cup;Straw Medication Administration: Whole meds with liquid Supervision: Staff to assist with self feeding (visually impairment)    Other  Recommendations      Recommendations for follow up therapy are one component of a multi-disciplinary discharge planning process, led by the attending physician.  Recommendations may be updated based on patient status, additional functional criteria and insurance authorization.  Follow up Recommendations        Assistance Recommended at Discharge    Functional Status Assessment    Frequency and Duration            Prognosis        Swallow Study   General HPI: [name] PMH Dm1 diabetic gastroparesis now s/p gastric pacemaker, HTN admitted 8/17 with leg swelling -- BLE foot wounds, which  were being followed outpt by podiatry. Admitted to [facility] for sepsis in this setting, OR with podiatry 8/18 for I&D BLE foot wounds.    ID was consulted 8/20 for ongoing fever. Concern for nec fasc  8/21 ortho consulted -- transferred to [facility], underwent excision and debridement of BLE --extending feet to thighs. Intubated from 8/21-8/23. Protein energy malnutrition, history of gastroparesis causing poor p.o. intake, baseline protein status very poor. Type of Study: Bedside Swallow Evaluation Previous Swallow Assessment: BSE 8/23 Diet Prior to this Study: NPO Temperature Spikes Noted: No Respiratory Status: Room air History of Recent Intubation: Yes Total duration of intubation (days): 1 days (for procedure) Date extubated: 10/14/22 Behavior/Cognition: Alert;Cooperative;Pleasant mood Oral Cavity Assessment: Within Functional Limits Oral Care Completed by SLP: No Oral Cavity - Dentition: Adequate natural dentition Vision: Functional for self-feeding Self-Feeding Abilities: Able to feed self Patient Positioning: Upright in bed Baseline Vocal Quality: Normal Volitional Cough: Strong Volitional  Swallow: Able to elicit    Oral/Motor/Sensory Function Overall Oral Motor/Sensory Function: Within functional limits   Ice Chips     Thin Liquid Thin Liquid: Within functional limits    Nectar Thick Nectar Thick Liquid: Not tested   Honey Thick Honey Thick Liquid: Not tested   Puree Puree: Within functional limits   Solid     Solid: Within functional limits      Amillya Chavira, Riley Nearing 10/15/2022,11:15 AM

## 2022-10-15 NOTE — Plan of Care (Signed)
  Problem: Education: Goal: Knowledge of General Education information will improve Description: Including pain rating scale, medication(s)/side effects and non-pharmacologic comfort measures Outcome: Progressing   Problem: Health Behavior/Discharge Planning: Goal: Ability to manage health-related needs will improve Outcome: Not Progressing   Problem: Clinical Measurements: Goal: Ability to maintain clinical measurements within normal limits will improve Outcome: Progressing Goal: Will remain free from infection Outcome: Progressing Goal: Diagnostic test results will improve Outcome: Progressing Goal: Respiratory complications will improve Outcome: Progressing Goal: Cardiovascular complication will be avoided Outcome: Progressing   Problem: Activity: Goal: Risk for activity intolerance will decrease Outcome: Progressing   Problem: Nutrition: Goal: Adequate nutrition will be maintained Outcome: Progressing   Problem: Coping: Goal: Level of anxiety will decrease Outcome: Progressing   Problem: Elimination: Goal: Will not experience complications related to bowel motility Outcome: Progressing Goal: Will not experience complications related to urinary retention Outcome: Progressing   Problem: Pain Managment: Goal: General experience of comfort will improve Outcome: Progressing   Problem: Safety: Goal: Ability to remain free from injury will improve Outcome: Progressing   Problem: Skin Integrity: Goal: Risk for impaired skin integrity will decrease Outcome: Progressing   Problem: Education: Goal: Ability to describe self-care measures that may prevent or decrease complications (Diabetes Survival Skills Education) will improve Outcome: Progressing Goal: Individualized Educational Video(s) Outcome: Not Applicable   Problem: Coping: Goal: Ability to adjust to condition or change in health will improve Outcome: Progressing   Problem: Fluid Volume: Goal: Ability to  maintain a balanced intake and output will improve Outcome: Progressing   Problem: Health Behavior/Discharge Planning: Goal: Ability to identify and utilize available resources and services will improve Outcome: Not Progressing Goal: Ability to manage health-related needs will improve Outcome: Progressing   Problem: Metabolic: Goal: Ability to maintain appropriate glucose levels will improve Outcome: Progressing   Problem: Nutritional: Goal: Maintenance of adequate nutrition will improve Outcome: Progressing Goal: Progress toward achieving an optimal weight will improve Outcome: Progressing   Problem: Skin Integrity: Goal: Risk for impaired skin integrity will decrease Outcome: Progressing   Problem: Education: Goal: Knowledge of the prescribed therapeutic regimen will improve Outcome: Progressing Goal: Ability to verbalize activity precautions or restrictions will improve Outcome: Progressing Goal: Understanding of discharge needs will improve Outcome: Progressing   Problem: Activity: Goal: Ability to perform//tolerate increased activity and mobilize with assistive devices will improve Outcome: Progressing   Problem: Clinical Measurements: Goal: Postoperative complications will be avoided or minimized Outcome: Progressing   Problem: Self-Care: Goal: Ability to meet self-care needs will improve Outcome: Progressing   Problem: Self-Concept: Goal: Ability to maintain and perform role responsibilities to the fullest extent possible will improve Outcome: Progressing   Problem: Pain Management: Goal: Pain level will decrease with appropriate interventions Outcome: Progressing   Problem: Skin Integrity: Goal: Demonstration of wound healing without infection will improve Outcome: Progressing   Problem: Activity: Goal: Ability to tolerate increased activity will improve Outcome: Progressing   Problem: Respiratory: Goal: Ability to maintain a clear airway and  adequate ventilation will improve Outcome: Progressing   Problem: Role Relationship: Goal: Method of communication will improve Outcome: Progressing   Problem: Role Relationship: Goal: Method of communication will improve Outcome: Progressing

## 2022-10-15 NOTE — Progress Notes (Signed)
Subjective: 2 Days Post-Op s/p Procedure(s): RIGHT THIGH WOUND VAC EXCHANGE  Increased tenderness to right thigh today compared to exam yesterday. Otherwise pain under control with current regimen.   Objective:  PE: VITALS:   Vitals:   10/15/22 0846 10/15/22 0900 10/15/22 1000 10/15/22 1130  BP:      Pulse: (!) 104 98 94   Resp: 13 (!) 7 17   Temp: 98.7 F (37.1 C)   98.6 F (37 C)  TempSrc: Axillary   Oral  SpO2: 99% 97% 98%   Weight:      Height:       RLE     Vac dressing intact, clean, dry             Edema/ swelling moderate No significant erythema Compartments soft. TTP to right thigh no TTP to lower leg, no fluctuance              Brisk cap refill, warm to touch LLE      Vac dressing intact, clean, dry  No significant erythema Patient does not indicate pain to palpation to thigh or lower leg, no fluctuance, compartments soft              Brisk cap refill, warm to touch Large foot dressings in place bilaterally Canisters have blood tinged serous fluid - per I&O report 1200 cc's from leg leg wound vac, 950 from right lower leg, and 550 from right lower leg in last 24 hrs  LABS  Results for orders placed or performed during the hospital encounter of 10/03/22 (from the past 24 hour(s))  Glucose, capillary     Status: Abnormal   Collection Time: 10/14/22  4:32 PM  Result Value Ref Range   Glucose-Capillary 238 (H) 70 - 99 mg/dL  Glucose, capillary     Status: Abnormal   Collection Time: 10/14/22  7:31 PM  Result Value Ref Range   Glucose-Capillary 193 (H) 70 - 99 mg/dL  Glucose, capillary     Status: Abnormal   Collection Time: 10/14/22  9:26 PM  Result Value Ref Range   Glucose-Capillary 187 (H) 70 - 99 mg/dL  Glucose, capillary     Status: Abnormal   Collection Time: 10/14/22 11:15 PM  Result Value Ref Range   Glucose-Capillary 240 (H) 70 - 99 mg/dL  Glucose, capillary     Status: Abnormal   Collection Time: 10/15/22  3:17 AM  Result Value Ref Range    Glucose-Capillary 157 (H) 70 - 99 mg/dL  Triglycerides     Status: None   Collection Time: 10/15/22  5:24 AM  Result Value Ref Range   Triglycerides 77 <150 mg/dL  CBC     Status: Abnormal   Collection Time: 10/15/22  5:24 AM  Result Value Ref Range   WBC 41.2 (H) 4.0 - 10.5 K/uL   RBC 2.43 (L) 3.87 - 5.11 MIL/uL   Hemoglobin 7.0 (L) 12.0 - 15.0 g/dL   HCT 25.3 (L) 66.4 - 40.3 %   MCV 89.3 80.0 - 100.0 fL   MCH 28.8 26.0 - 34.0 pg   MCHC 32.3 30.0 - 36.0 g/dL   RDW 47.4 (H) 25.9 - 56.3 %   Platelets 319 150 - 400 K/uL   nRBC 0.1 0.0 - 0.2 %  Basic metabolic panel     Status: Abnormal   Collection Time: 10/15/22  5:24 AM  Result Value Ref Range   Sodium 135 135 - 145 mmol/L   Potassium 4.3  3.5 - 5.1 mmol/L   Chloride 107 98 - 111 mmol/L   CO2 19 (L) 22 - 32 mmol/L   Glucose, Bld 234 (H) 70 - 99 mg/dL   BUN 59 (H) 6 - 20 mg/dL   Creatinine, Ser 1.61 (H) 0.44 - 1.00 mg/dL   Calcium 7.7 (L) 8.9 - 10.3 mg/dL   GFR, Estimated 36 (L) >60 mL/min   Anion gap 9 5 - 15  Glucose, capillary     Status: Abnormal   Collection Time: 10/15/22  7:41 AM  Result Value Ref Range   Glucose-Capillary 204 (H) 70 - 99 mg/dL  Glucose, capillary     Status: Abnormal   Collection Time: 10/15/22 11:30 AM  Result Value Ref Range   Glucose-Capillary 262 (H) 70 - 99 mg/dL    No results found.  Assessment/Plan: Necrotizing fasciitis bilateral thighs, bilateral legs, bilateral feet S/p bilateral leg excisional debridement and wound vac placement with Dr. Lajoyce Corners 8/21 S/p bilateral leg excisional debridement and wound vac placement with Dr. Carola Frost 8/25 S/p debridement of right thigh with wound vac changed with Dr. Jena Gauss 8/26  - Hbg dropped 7.0 today from 7.7 yesterday, will watch - afebrile, continued leukocytosis 41.2 today, increased from 40.6 yesterday - wound vacs with good seal today - continue IV antibiotics per ID - DVT prophylaxis heparin  - continue current plan for repeat debridement  with Dr. Lajoyce Corners on Friday - following, please contact Dr. Teryl Lucy or me for any orthopedic questions or concerns this week until repeat debridement with Dr. Lajoyce Corners on Friday.  Contact information:   Janine Ores, Cordelia Poche [id number]  After hours and holidays please check [email or web address] for group call information for [facility]  Armida Sans 10/15/2022, 12:28 PM

## 2022-10-15 NOTE — Progress Notes (Signed)
[facility]  Date of Admission:  10/03/2022     Total days of antibiotics 12         ASSESSMENT:  Ms. Gioia is POD #2 from wound vac changes and has been afebrile over the past 24 hours with continued leukocytosis. Tolerating antibiotics and having looser stools which likely related to combination of antibiotics and enteral feeding. No clear infectious diarrhea at this time. Plan for surgery on 10/17/22 with Dr. Lajoyce Corners. Platelet count stable with current dose of linezolid and will continue therapeutic drug monitoring. Continue linezolid, cefepime and metronidazole. Post-operative wound care per Orthopedics with remaining medical and supportive care per CCM.   PLAN:  Continue linezolid, cefepime, and metronidazole. Post-operative wound care and further surgical interventions per Orthopedics.  Therapeutic drug monitoring of platelet levels  Remaining medical and supportive care per CCM.    I have personally spent 26 minutes involved in face-to-face and non-face-to-face activities for this patient on the day of the visit. Professional time spent includes the following activities: Preparing to see the patient (review of tests), Obtaining and/or reviewing separately obtained history (admission/discharge record), Performing a medically appropriate examination and/or evaluation , Ordering medications/tests/procedures, referring and communicating with other health care professionals, Documenting clinical information in the EMR, Independently interpreting results (not separately reported), Communicating results to the patient/family/caregiver, Counseling and educating the patient/family/caregiver and Care coordination (not separately reported).    Principal Problem:   Necrotizing fasciitis (HCC) Active Problems:   Anemia   Sepsis (HCC)   AKI (acute kidney injury) (HCC)   Diabetic gastroparesis associated with type 1 diabetes mellitus (HCC)   DM type 1  (diabetes mellitus, type 1) (HCC)   Drug-seeking behavior   Sepsis due to cellulitis (HCC)   Cellulitis of lower extremity   Necrotizing fasciitis of pelvic region and thigh (HCC)   Necrotizing fasciitis of lower leg (HCC)    acetaminophen  1,000 mg Per Tube Q6H   vitamin C  500 mg Per Tube Daily   Chlorhexidine Gluconate Cloth  6 each Topical Daily   copper  4 mg Per Tube Daily   feeding supplement (PROSource TF20)  60 mL Per Tube Daily   fiber supplement (BANATROL TF)  60 mL Per Tube BID   folic acid  1 mg Per Tube Daily   gabapentin  300 mg Per Tube Q8H   heparin injection (subcutaneous)  5,000 Units Subcutaneous Q8H   insulin aspart  0-15 Units Subcutaneous Q4H   insulin detemir  5 Units Subcutaneous BID   linezolid  600 mg Per Tube Q12H   metoCLOPramide  10 mg Per Tube Q8H   metroNIDAZOLE  500 mg Per Tube Q12H   multivitamin with minerals  1 tablet Per Tube Daily   nutrition supplement (JUVEN)  1 packet Per Tube BID BM   mouth rinse  15 mL Mouth Rinse 4 times per day   oxyCODONE  5 mg Per Tube Q6H   pantoprazole (PROTONIX) IV  40 mg Intravenous Q12H   polyethylene glycol  17 g Per Tube Daily   sodium chloride flush  10-40 mL Intracatheter Q12H   thiamine  100 mg Per Tube Daily   zinc sulfate  220 mg Per Tube Daily    SUBJECTIVE:  Afebrile overnight with no acute events and continued leukocytosis with WBC count 41.2. Feeling better with continued pain in her legs. Having loose stools.   Allergies  Allergen Reactions   Ibuprofen Other (See Comments)  07/23/22 - Worsening kidney function after 3 doses of ibuprofen 400mg .  Creatinine increased from 1.3 to 2.  Creatinine returned to normal after stopping.   Lisinopril Cough, Other (See Comments), Itching and Rash    Breakout in rash     Review of Systems: Review of Systems  Constitutional:  Negative for chills, fever and weight loss.  Respiratory:  Negative for cough, shortness of breath and wheezing.    Cardiovascular:  Negative for chest pain and leg swelling.  Gastrointestinal:  Negative for abdominal pain, constipation, diarrhea, nausea and vomiting.  Musculoskeletal:        Positive for leg pain   Skin:  Negative for rash.      OBJECTIVE: Vitals:   10/15/22 0800 10/15/22 0846 10/15/22 0900 10/15/22 1000  BP:      Pulse: 98 (!) 104 98 94  Resp: 12 13 (!) 7 17  Temp: 98.8 F (37.1 C) 98.7 F (37.1 C)    TempSrc: Bladder Axillary    SpO2: 98% 99% 97% 98%  Weight:      Height:       Body mass index is 36.84 kg/m.  Physical Exam Constitutional:      General: She is not in acute distress.    Appearance: She is well-developed.  Cardiovascular:     Rate and Rhythm: Regular rhythm. Tachycardia present.     Heart sounds: Normal heart sounds.  Pulmonary:     Effort: Pulmonary effort is normal.     Breath sounds: Normal breath sounds.  Skin:    General: Skin is warm and dry.  Neurological:     Mental Status: She is alert.  Psychiatric:        Mood and Affect: Mood normal.     Lab Results Lab Results  Component Value Date   WBC 41.2 (H) 10/15/2022   HGB 7.0 (L) 10/15/2022   HCT 21.7 (L) 10/15/2022   MCV 89.3 10/15/2022   PLT 319 10/15/2022    Lab Results  Component Value Date   CREATININE 1.90 (H) 10/15/2022   BUN 59 (H) 10/15/2022   NA 135 10/15/2022   K 4.3 10/15/2022   CL 107 10/15/2022   CO2 19 (L) 10/15/2022    Lab Results  Component Value Date   ALT 18 10/14/2022   AST 23 10/14/2022   ALKPHOS 193 (H) 10/14/2022   BILITOT 0.4 10/14/2022     Microbiology: Recent Results (from the past 240 hour(s))  MRSA Next Gen by PCR, Nasal     Status: None   Collection Time: 10/07/22 12:20 PM   Specimen: Nasal Mucosa; Nasal Swab  Result Value Ref Range Status   MRSA by PCR Next Gen NOT DETECTED NOT DETECTED Final    Comment: (NOTE) The GeneXpert MRSA Assay (FDA approved for NASAL specimens only), is one component of a comprehensive MRSA colonization  surveillance program. It is not intended to diagnose MRSA infection nor to guide or monitor treatment for MRSA infections. Test performance is not FDA approved in patients less than 76 years old. Performed at [facility], [address] [place], [place] [address]   Culture, blood (Routine X 2) w Reflex to ID Panel     Status: None   Collection Time: 10/07/22 10:08 PM   Specimen: BLOOD LEFT HAND  Result Value Ref Range Status   Specimen Description BLOOD LEFT HAND  Final   Special Requests   Final    BOTTLES DRAWN AEROBIC ONLY Blood Culture adequate volume  Culture   Final    NO GROWTH 5 DAYS Performed at [facility], [address], [place], [place] [address]    Report Status 10/13/2022 FINAL  Final  Culture, blood (Routine X 2) w Reflex to ID Panel     Status: None   Collection Time: 10/07/22 10:10 PM   Specimen: BLOOD LEFT HAND  Result Value Ref Range Status   Specimen Description BLOOD LEFT HAND  Final   Special Requests   Final    BOTTLES DRAWN AEROBIC ONLY Blood Culture results may not be optimal due to an inadequate volume of blood received in culture bottles   Culture   Final    NO GROWTH 5 DAYS Performed at [facility], [address] [place], [place] [address]    Report Status 10/13/2022 FINAL  Final  Aerobic/Anaerobic Culture w Gram Stain (surgical/deep wound)     Status: None   Collection Time: 10/08/22  4:11 PM   Specimen: Path Tissue  Result Value Ref Range Status   Specimen Description TISSUE  Final   Special Requests RIGHT CALF SPEC A  Final   Gram Stain   Final    RARE WBC PRESENT, PREDOMINANTLY PMN NO ORGANISMS SEEN Gram Stain Report Called to,Read Back By and Verified With: RN Budd Palmer [phone] @1740  FH    Culture   Final    RARE STAPHYLOCOCCUS EPIDERMIDIS NO ANAEROBES ISOLATED Performed at [facility], [address], [place], [place] [address]    Report Status 10/13/2022 FINAL  Final   Organism ID,  Bacteria STAPHYLOCOCCUS EPIDERMIDIS  Final      Susceptibility   Staphylococcus epidermidis - MIC*    CIPROFLOXACIN <=0.5 SENSITIVE Sensitive     ERYTHROMYCIN >=8 RESISTANT Resistant     GENTAMICIN <=0.5 SENSITIVE Sensitive     OXACILLIN >=4 RESISTANT Resistant     TETRACYCLINE >=16 RESISTANT Resistant     VANCOMYCIN 1 SENSITIVE Sensitive     TRIMETH/SULFA 160 RESISTANT Resistant     CLINDAMYCIN <=0.25 SENSITIVE Sensitive     RIFAMPIN <=0.5 SENSITIVE Sensitive     Inducible Clindamycin NEGATIVE Sensitive     * RARE STAPHYLOCOCCUS EPIDERMIDIS  Aerobic/Anaerobic Culture w Gram Stain (surgical/deep wound)     Status: None   Collection Time: 10/08/22  4:15 PM   Specimen: Path Tissue  Result Value Ref Range Status   Specimen Description TISSUE  Final   Special Requests RIGHT LATERAL CALF SPEC B  Final   Gram Stain   Final    NO WBC SEEN NO ORGANISMS SEEN Gram Stain Report Called to,Read Back By and Verified With: RN SSu Hilt [phone] @1740  FH    Culture   Final    RARE STAPHYLOCOCCUS AUREUS SUSCEPTIBILITIES PERFORMED ON PREVIOUS CULTURE WITHIN THE LAST 5 DAYS. NO ANAEROBES ISOLATED Performed at [facility], [address] [place], [place] [address]    Report Status 10/13/2022 FINAL  Final  Aerobic/Anaerobic Culture w Gram Stain (surgical/deep wound)     Status: None   Collection Time: 10/08/22  4:19 PM   Specimen: Path Tissue  Result Value Ref Range Status   Specimen Description TISSUE  Final   Special Requests RIGHT FOOT SPEC C  Final   Gram Stain   Final    NO WBC SEEN NO ORGANISMS SEEN Gram Stain Report Called to,Read Back By and Verified With: RN Budd Palmer [phone] @1740  FH    Culture   Final    RARE  STAPHYLOCOCCUS AUREUS RARE PSEUDOMONAS AERUGINOSA CRITICAL RESULT CALLED TO, READ BACK BY AND VERIFIED WITH: RN [email or web address] AT 1207 ON 10/09/2022 BY T.SAAD. NO ANAEROBES ISOLATED Performed at [facility], [address], [place], [place] [address]    Report  Status 10/13/2022 FINAL  Final   Organism ID, Bacteria STAPHYLOCOCCUS AUREUS  Final   Organism ID, Bacteria PSEUDOMONAS AERUGINOSA  Final      Susceptibility   Pseudomonas aeruginosa - MIC*    CEFTAZIDIME 4 SENSITIVE Sensitive     CIPROFLOXACIN <=0.25 SENSITIVE Sensitive     GENTAMICIN <=1 SENSITIVE Sensitive     IMIPENEM 1 SENSITIVE Sensitive     PIP/TAZO 16 SENSITIVE Sensitive     CEFEPIME 2 SENSITIVE Sensitive     * RARE PSEUDOMONAS AERUGINOSA   Staphylococcus aureus - MIC*    CIPROFLOXACIN <=0.5 SENSITIVE Sensitive     ERYTHROMYCIN >=8 RESISTANT Resistant     GENTAMICIN <=0.5 SENSITIVE Sensitive     OXACILLIN 0.5 SENSITIVE Sensitive     TETRACYCLINE >=16 RESISTANT Resistant     VANCOMYCIN <=0.5 SENSITIVE Sensitive     TRIMETH/SULFA <=10 SENSITIVE Sensitive     CLINDAMYCIN >=8 RESISTANT Resistant     RIFAMPIN <=0.5 SENSITIVE Sensitive     Inducible Clindamycin NEGATIVE Sensitive     LINEZOLID 2 SENSITIVE Sensitive     * RARE STAPHYLOCOCCUS AUREUS  Aerobic/Anaerobic Culture w Gram Stain (surgical/deep wound)     Status: None   Collection Time: 10/08/22  4:48 PM   Specimen: Path Tissue  Result Value Ref Range Status   Specimen Description TISSUE LEFT THIGH  Final   Special Requests PT ON CEFEPIME,LINEZOLID, FLAGYL  Final   Gram Stain   Final    RARE WBC PRESENT, PREDOMINANTLY PMN NO ORGANISMS SEEN Gram Stain Report Called to,Read Back By and Verified With: RN Osvaldo Shipper @1740  FH    Culture   Final    RARE DIPHTHEROIDS(CORYNEBACTERIUM SPECIES) Standardized susceptibility testing for this organism is not available. NO ANAEROBES ISOLATED Performed at [facility], [address] [place], [place] [address]    Report Status 10/13/2022 FINAL  Final     Marcos Eke, NP [facility]  10/15/2022  11:23 AM

## 2022-10-15 NOTE — Progress Notes (Addendum)
eLink Physician-Brief Progress Note Patient Name: Kelli Hudson DOB: 1990-05-15 MRN: [id number]   Date of Service  10/15/2022  HPI/Events of Note  32 year old female with diabetes who presented with necrotizing fasciitis of her bilateral lower extremities.  New fevers up to 38.9 per Foley temp.  Currently on linezolid, cefepime, metronidazole.  She has also been having worsening suicidal thoughts feels like giving up but does not have any plan in place.  eICU Interventions  Will repeat blood cultures, respiratory cultures, replace Foley catheter.  Maintain current antibiotics.  Acetaminophen as needed  Suicide watch precautions.   2352 -had a large-volume emesis earlier in the day, tube feeds were held.  Had hypoglycemia at the beginning of shift.  Stable but low now.  Initiate low-dose dextrose infusion.  4098 -patient had a drop in blood pressure with the administration of Dilaudid with a drop to MAP of 58.  Given that this is likely iatrogenic secondary to medication, can wait for the Dilaudid to wear off.  No further emesis tonight with tolerated ice chips-could consider resuming tube feeds this morning, will defer for now.     Aron Inge 10/15/2022, 10:39 PM

## 2022-10-16 DIAGNOSIS — M726 Necrotizing fasciitis: Secondary | ICD-10-CM | POA: Diagnosis not present

## 2022-10-16 DIAGNOSIS — F0631 Mood disorder due to known physiological condition with depressive features: Secondary | ICD-10-CM

## 2022-10-16 DIAGNOSIS — F331 Major depressive disorder, recurrent, moderate: Secondary | ICD-10-CM

## 2022-10-16 LAB — CBC
HCT: 22 % — ABNORMAL LOW (ref 36.0–46.0)
Hemoglobin: 7.3 g/dL — ABNORMAL LOW (ref 12.0–15.0)
MCH: 29.1 pg (ref 26.0–34.0)
MCHC: 33.2 g/dL (ref 30.0–36.0)
MCV: 87.6 fL (ref 80.0–100.0)
Platelets: 343 10*3/uL (ref 150–400)
RBC: 2.51 MIL/uL — ABNORMAL LOW (ref 3.87–5.11)
RDW: 17.2 % — ABNORMAL HIGH (ref 11.5–15.5)
WBC: 47.7 10*3/uL — ABNORMAL HIGH (ref 4.0–10.5)
nRBC: 0.2 % (ref 0.0–0.2)

## 2022-10-16 LAB — GLUCOSE, CAPILLARY
Glucose-Capillary: 86 mg/dL (ref 70–99)
Glucose-Capillary: 102 mg/dL — ABNORMAL HIGH (ref 70–99)
Glucose-Capillary: 120 mg/dL — ABNORMAL HIGH (ref 70–99)
Glucose-Capillary: 150 mg/dL — ABNORMAL HIGH (ref 70–99)
Glucose-Capillary: 143 mg/dL — ABNORMAL HIGH (ref 70–99)
Glucose-Capillary: 120 mg/dL — ABNORMAL HIGH (ref 70–99)
Glucose-Capillary: 111 mg/dL — ABNORMAL HIGH (ref 70–99)

## 2022-10-16 LAB — BASIC METABOLIC PANEL
Anion gap: 6 (ref 5–15)
BUN: 56 mg/dL — ABNORMAL HIGH (ref 6–20)
CO2: 18 mmol/L — ABNORMAL LOW (ref 22–32)
Calcium: 8 mg/dL — ABNORMAL LOW (ref 8.9–10.3)
Chloride: 110 mmol/L (ref 98–111)
Creatinine, Ser: 1.69 mg/dL — ABNORMAL HIGH (ref 0.44–1.00)
GFR, Estimated: 41 mL/min — ABNORMAL LOW (ref >60)
Glucose, Bld: 88 mg/dL (ref 70–99)
Potassium: 4.7 mmol/L (ref 3.5–5.1)
Sodium: 134 mmol/L — ABNORMAL LOW (ref 135–145)

## 2022-10-16 MED ORDER — PANTOPRAZOLE SODIUM 40 MG IV SOLR
40.0000 mg | Freq: Two times a day (BID) | INTRAVENOUS | Status: DC
  Administered 2022-10-16 – 2022-10-23 (×16): 40 mg via INTRAVENOUS
  Filled 2022-10-16 (×18): qty 10

## 2022-10-16 MED ORDER — METOCLOPRAMIDE HCL 5 MG PO TABS
10.0000 mg | ORAL_TABLET | Freq: Three times a day (TID) | ORAL | Status: DC
  Administered 2022-10-16 – 2022-10-26 (×28): 10 mg
  Filled 2022-10-16: qty 1
  Filled 2022-10-16: qty 2
  Filled 2022-10-16 (×5): qty 1
  Filled 2022-10-16: qty 2
  Filled 2022-10-16: qty 1
  Filled 2022-10-16: qty 2
  Filled 2022-10-16: qty 1
  Filled 2022-10-16 (×2): qty 2
  Filled 2022-10-16 (×5): qty 1
  Filled 2022-10-16 (×2): qty 2
  Filled 2022-10-16 (×4): qty 1
  Filled 2022-10-16: qty 2
  Filled 2022-10-16: qty 1
  Filled 2022-10-16: qty 2
  Filled 2022-10-16 (×5): qty 1

## 2022-10-16 MED ORDER — NOREPINEPHRINE 4 MG/250ML-% IV SOLN
0.0000 ug/min | INTRAVENOUS | Status: DC
  Administered 2022-10-18: 2 ug/min via INTRAVENOUS

## 2022-10-16 MED ORDER — NOREPINEPHRINE 4 MG/250ML-% IV SOLN
INTRAVENOUS | Status: AC
  Administered 2022-10-16: 2 ug/min via INTRAVENOUS
  Filled 2022-10-16: qty 250

## 2022-10-16 MED ORDER — DULOXETINE HCL 30 MG PO CPEP
30.0000 mg | ORAL_CAPSULE | Freq: Every day | ORAL | Status: DC
  Administered 2022-10-16 – 2022-10-20 (×5): 30 mg via ORAL
  Filled 2022-10-16 (×6): qty 1

## 2022-10-16 MED ORDER — SODIUM CHLORIDE 0.9 % IV SOLN
2.0000 g | Freq: Three times a day (TID) | INTRAVENOUS | Status: DC
  Administered 2022-10-16 – 2022-10-17 (×3): 2 g via INTRAVENOUS
  Filled 2022-10-16 (×3): qty 12.5

## 2022-10-16 NOTE — Progress Notes (Signed)
Subjective: 3 Days Post-Op s/p Procedure(s): RIGHT THIGH WOUND VAC EXCHANGE  Patient states no changes in pain overnight.   Objective:  PE: VITALS:   Vitals:   10/16/22 0500 10/16/22 0600 10/16/22 0633 10/16/22 0724  BP: (!) 90/43   (!) 113/53  Pulse: 99 (!) 103 (!) 103 (!) 108  Resp: (!) 9 10 12 12   Temp: (!) 100.8 F (38.2 C) (!) 100.6 F (38.1 C) (!) 100.4 F (38 C) (!) 100.4 F (38 C)  TempSrc:    Bladder  SpO2: 96% 98% 98% 98%  Weight:      Height:       RLE     Vac dressing intact, clean, dry             Edema/ swelling moderate No significant erythema Compartments soft. Less TTP to right thigh this morning than yesterday, no TTP to lower leg, no fluctuance              Brisk cap refill, warm to touch  Foot wet-dry dressings intact, no changes overnight, picture in chart LLE      Vac dressing intact, clean, dry  No significant erythema Patient does not indicate pain to palpation to thigh or lower leg, no fluctuance, compartments soft              Brisk cap refill, warm to touch  Foot wet-dry dressings intact, no changes overnight Canisters have blood tinged serous fluid - per I&O report 900 cc's from left leg wound vac, 1000 from right lower leg, and 850 from right thigh in last 24 hrs  LABS  Results for orders placed or performed during the hospital encounter of 10/03/22 (from the past 24 hour(s))  Glucose, capillary     Status: Abnormal   Collection Time: 10/15/22  7:41 AM  Result Value Ref Range   Glucose-Capillary 204 (H) 70 - 99 mg/dL  Glucose, capillary     Status: Abnormal   Collection Time: 10/15/22 11:30 AM  Result Value Ref Range   Glucose-Capillary 262 (H) 70 - 99 mg/dL  Glucose, capillary     Status: Abnormal   Collection Time: 10/15/22  3:34 PM  Result Value Ref Range   Glucose-Capillary 282 (H) 70 - 99 mg/dL  Glucose, capillary     Status: Abnormal   Collection Time: 10/15/22  7:39 PM  Result Value Ref Range   Glucose-Capillary 65 (L)  70 - 99 mg/dL  Glucose, capillary     Status: None   Collection Time: 10/15/22  8:46 PM  Result Value Ref Range   Glucose-Capillary 77 70 - 99 mg/dL  Glucose, capillary     Status: None   Collection Time: 10/15/22 10:03 PM  Result Value Ref Range   Glucose-Capillary 82 70 - 99 mg/dL  Glucose, capillary     Status: None   Collection Time: 10/15/22 11:32 PM  Result Value Ref Range   Glucose-Capillary 83 70 - 99 mg/dL  CBC     Status: Abnormal   Collection Time: 10/16/22  2:54 AM  Result Value Ref Range   WBC 47.7 (H) 4.0 - 10.5 K/uL   RBC 2.51 (L) 3.87 - 5.11 MIL/uL   Hemoglobin 7.3 (L) 12.0 - 15.0 g/dL   HCT 26.9 (L) 48.5 - 46.2 %   MCV 87.6 80.0 - 100.0 fL   MCH 29.1 26.0 - 34.0 pg   MCHC 33.2 30.0 - 36.0 g/dL   RDW 70.3 (H) 50.0 - 93.8 %  Platelets 343 150 - 400 K/uL   nRBC 0.2 0.0 - 0.2 %  Basic metabolic panel     Status: Abnormal   Collection Time: 10/16/22  2:54 AM  Result Value Ref Range   Sodium 134 (L) 135 - 145 mmol/L   Potassium 4.7 3.5 - 5.1 mmol/L   Chloride 110 98 - 111 mmol/L   CO2 18 (L) 22 - 32 mmol/L   Glucose, Bld 88 70 - 99 mg/dL   BUN 56 (H) 6 - 20 mg/dL   Creatinine, Ser 3.23 (H) 0.44 - 1.00 mg/dL   Calcium 8.0 (L) 8.9 - 10.3 mg/dL   GFR, Estimated 41 (L) >60 mL/min   Anion gap 6 5 - 15  Glucose, capillary     Status: None   Collection Time: 10/16/22  4:01 AM  Result Value Ref Range   Glucose-Capillary 86 70 - 99 mg/dL    No results found.  Assessment/Plan: Necrotizing fasciitis bilateral thighs, bilateral legs, bilateral feet S/p bilateral leg excisional debridement and wound vac placement with Dr. Lajoyce Corners 8/21 S/p bilateral leg excisional debridement and wound vac placement with Dr. Carola Frost 8/25 S/p debridement of right thigh with wound vac changed with Dr. Jena Gauss 8/26  - Hbg trending up to 7.3 today - febrile, tachycardic, WBC increased to 47.7 - wound vacs with good seal today, no change in exam - continue IV antibiotics per ID - DVT  prophylaxis heparin  - Discussed with Dr. Dion Saucier, continue current plan for repeat debridement with Dr. Lajoyce Corners tomorrow, NPO after midnight  Contact information:   Janine Ores, PA-C Weekdays 8-5  After hours and holidays please check [email or web address] for group call information for [facility]  Armida Sans 10/16/2022, 7:37 AM

## 2022-10-16 NOTE — Progress Notes (Signed)
[facility]  Date of Admission:  10/03/2022     Total days of antibiotics 13         ASSESSMENT:  Kelli Hudson has increasing leukocytosis and has been febrile overnight with most likely related to source control. CCM planning to remove lines and drains as able which will help further reduce risk. Plan for surgery tomorrow with Dr. Lajoyce Corners. Diarrhea has improved with no recent bowel movements with no new intervention needed. Continue current dose of linezolid, cefepime and metronidazole. Post-operative wound care per Orthopedics and remaining medical and supportive care per CCM.   PLAN:  Continue current dose of linezolid, cefepime, and metronidazole. CCM removing lines/drains as appropriate.  Plan for OR for debridement tomorrow.  Post-operative wound care per Orthopedics.  Continue therapeutic drug monitoring of platelet levels.  Remaining medical and supportive care per CCM.    I have personally spent 24 minutes involved in face-to-face and non-face-to-face activities for this patient on the day of the visit. Professional time spent includes the following activities: Preparing to see the patient (review of tests), Obtaining and/or reviewing separately obtained history (admission/discharge record), Performing a medically appropriate examination and/or evaluation , Ordering medications/tests/procedures, referring and communicating with other health care professionals, Documenting clinical information in the EMR, Independently interpreting results (not separately reported), Communicating results to the patient/family/caregiver, Counseling and educating the patient/family/caregiver and Care coordination (not separately reported).    Principal Problem:   Necrotizing fasciitis (HCC) Active Problems:   Anemia   Sepsis (HCC)   AKI (acute kidney injury) (HCC)   Diabetic gastroparesis associated with type 1 diabetes mellitus (HCC)   DM type 1 (diabetes  mellitus, type 1) (HCC)   Drug-seeking behavior   Sepsis due to cellulitis (HCC)   Cellulitis of lower extremity   Necrotizing fasciitis of pelvic region and thigh (HCC)   Necrotizing fasciitis of lower leg (HCC)    acetaminophen  1,000 mg Oral Q6H   vitamin C  500 mg Oral Daily   Chlorhexidine Gluconate Cloth  6 each Topical Daily   copper  4 mg Oral Daily   feeding supplement (PROSource TF20)  60 mL Per Tube Daily   fiber supplement (BANATROL TF)  60 mL Per Tube BID   folic acid  1 mg Oral Daily   gabapentin  300 mg Oral TID   heparin injection (subcutaneous)  5,000 Units Subcutaneous Q8H   insulin aspart  0-15 Units Subcutaneous Q4H   insulin aspart  2 Units Subcutaneous Q4H   insulin detemir  5 Units Subcutaneous BID   linezolid  600 mg Oral Q12H   methocarbamol  1,000 mg Oral TID   metoCLOPramide  10 mg Per Tube Q8H   metroNIDAZOLE  500 mg Oral Q12H   multivitamin with minerals  1 tablet Oral Daily   nutrition supplement (JUVEN)  1 packet Per Tube BID BM   mouth rinse  15 mL Mouth Rinse 4 times per day   oxyCODONE  5 mg Oral Q6H   pantoprazole (PROTONIX) IV  40 mg Intravenous Q12H   polyethylene glycol  17 g Per Tube Daily   sodium chloride flush  10-40 mL Intracatheter Q12H   thiamine  100 mg Oral Daily   zinc sulfate  220 mg Oral Daily    SUBJECTIVE:  Febrile overnight with increasing leukocytosis. Bilateral lower extremity pain rated 10/10. Diarrhea improved.   Allergies  Allergen Reactions   Ibuprofen Other (See Comments)    07/23/22 - Worsening  kidney function after 3 doses of ibuprofen 400mg .  Creatinine increased from 1.3 to 2.  Creatinine returned to normal after stopping.   Lisinopril Cough, Other (See Comments), Itching and Rash    Breakout in rash     Review of Systems: Review of Systems  Constitutional:  Negative for chills, fever and weight loss.  Respiratory:  Negative for cough, shortness of breath and wheezing.   Cardiovascular:  Negative for  chest pain and leg swelling.  Gastrointestinal:  Negative for abdominal pain, constipation, diarrhea, nausea and vomiting.  Musculoskeletal:        Bilateral lower extremity pain  Skin:  Negative for rash.      OBJECTIVE: Vitals:   10/16/22 0800 10/16/22 0900 10/16/22 0921 10/16/22 1000  BP:      Pulse: (!) 104 100 97 96  Resp: 15 (!) 6 (!) 5 (!) 9  Temp: (!) 100.6 F (38.1 C) (!) 100.8 F (38.2 C) (!) 100.9 F (38.3 C) (!) 101.1 F (38.4 C)  TempSrc:      SpO2: 98% 96% (!) 87% 98%  Weight:      Height:       Body mass index is 36.84 kg/m.  Physical Exam Constitutional:      General: She is not in acute distress.    Appearance: She is well-developed.  Cardiovascular:     Rate and Rhythm: Normal rate and regular rhythm.     Heart sounds: Normal heart sounds.  Pulmonary:     Effort: Pulmonary effort is normal.     Breath sounds: Normal breath sounds.  Musculoskeletal:     Comments: Bilateral lower extremities with wound vacs in place with good seal. There is serosanguinous drainage in all canisters.   Skin:    General: Skin is warm and dry.  Neurological:     Mental Status: She is alert.     Lab Results Lab Results  Component Value Date   WBC 47.7 (H) 10/16/2022   HGB 7.3 (L) 10/16/2022   HCT 22.0 (L) 10/16/2022   MCV 87.6 10/16/2022   PLT 343 10/16/2022    Lab Results  Component Value Date   CREATININE 1.69 (H) 10/16/2022   BUN 56 (H) 10/16/2022   NA 134 (L) 10/16/2022   K 4.7 10/16/2022   CL 110 10/16/2022   CO2 18 (L) 10/16/2022    Lab Results  Component Value Date   ALT 18 10/14/2022   AST 23 10/14/2022   ALKPHOS 193 (H) 10/14/2022   BILITOT 0.4 10/14/2022     Microbiology: Recent Results (from the past 240 hour(s))  MRSA Next Gen by PCR, Nasal     Status: None   Collection Time: 10/07/22 12:20 PM   Specimen: Nasal Mucosa; Nasal Swab  Result Value Ref Range Status   MRSA by PCR Next Gen NOT DETECTED NOT DETECTED Final    Comment:  (NOTE) The GeneXpert MRSA Assay (FDA approved for NASAL specimens only), is one component of a comprehensive MRSA colonization surveillance program. It is not intended to diagnose MRSA infection nor to guide or monitor treatment for MRSA infections. Test performance is not FDA approved in patients less than 77 years old. Performed at [facility], [address], [place], [place] [address]   Culture, blood (Routine X 2) w Reflex to ID Panel     Status: None   Collection Time: 10/07/22 10:08 PM   Specimen: BLOOD LEFT HAND  Result Value Ref Range Status   Specimen Description  BLOOD LEFT HAND  Final   Special Requests   Final    BOTTLES DRAWN AEROBIC ONLY Blood Culture adequate volume   Culture   Final    NO GROWTH 5 DAYS Performed at [facility], [address] [place], [place] [address]    Report Status 10/13/2022 FINAL  Final  Culture, blood (Routine X 2) w Reflex to ID Panel     Status: None   Collection Time: 10/07/22 10:10 PM   Specimen: BLOOD LEFT HAND  Result Value Ref Range Status   Specimen Description BLOOD LEFT HAND  Final   Special Requests   Final    BOTTLES DRAWN AEROBIC ONLY Blood Culture results may not be optimal due to an inadequate volume of blood received in culture bottles   Culture   Final    NO GROWTH 5 DAYS Performed at [facility], [address], [place], [place] [address]    Report Status 10/13/2022 FINAL  Final  Aerobic/Anaerobic Culture w Gram Stain (surgical/deep wound)     Status: None   Collection Time: 10/08/22  4:11 PM   Specimen: Path Tissue  Result Value Ref Range Status   Specimen Description TISSUE  Final   Special Requests RIGHT CALF SPEC A  Final   Gram Stain   Final    RARE WBC PRESENT, PREDOMINANTLY PMN NO ORGANISMS SEEN Gram Stain Report Called to,Read Back By and Verified With: RN Budd Palmer [phone] @1740  FH    Culture   Final    RARE STAPHYLOCOCCUS EPIDERMIDIS NO ANAEROBES ISOLATED Performed at  [facility], [address] [place], [place] [address]    Report Status 10/13/2022 FINAL  Final   Organism ID, Bacteria STAPHYLOCOCCUS EPIDERMIDIS  Final      Susceptibility   Staphylococcus epidermidis - MIC*    CIPROFLOXACIN <=0.5 SENSITIVE Sensitive     ERYTHROMYCIN >=8 RESISTANT Resistant     GENTAMICIN <=0.5 SENSITIVE Sensitive     OXACILLIN >=4 RESISTANT Resistant     TETRACYCLINE >=16 RESISTANT Resistant     VANCOMYCIN 1 SENSITIVE Sensitive     TRIMETH/SULFA 160 RESISTANT Resistant     CLINDAMYCIN <=0.25 SENSITIVE Sensitive     RIFAMPIN <=0.5 SENSITIVE Sensitive     Inducible Clindamycin NEGATIVE Sensitive     * RARE STAPHYLOCOCCUS EPIDERMIDIS  Aerobic/Anaerobic Culture w Gram Stain (surgical/deep wound)     Status: None   Collection Time: 10/08/22  4:15 PM   Specimen: Path Tissue  Result Value Ref Range Status   Specimen Description TISSUE  Final   Special Requests RIGHT LATERAL CALF SPEC B  Final   Gram Stain   Final    NO WBC SEEN NO ORGANISMS SEEN Gram Stain Report Called to,Read Back By and Verified With: RN SSu Hilt [phone] @1740  FH    Culture   Final    RARE STAPHYLOCOCCUS AUREUS SUSCEPTIBILITIES PERFORMED ON PREVIOUS CULTURE WITHIN THE LAST 5 DAYS. NO ANAEROBES ISOLATED Performed at [facility], [address], [place], [place] [address]    Report Status 10/13/2022 FINAL  Final  Aerobic/Anaerobic Culture w Gram Stain (surgical/deep wound)     Status: None   Collection Time: 10/08/22  4:19 PM   Specimen: Path Tissue  Result Value Ref Range Status   Specimen Description TISSUE  Final   Special Requests RIGHT FOOT SPEC C  Final   Gram Stain   Final    NO WBC SEEN NO ORGANISMS SEEN Gram Stain  Report Called to,Read Back By and Verified With: RN SSu Hilt [phone] @1740  FH    Culture   Final    RARE STAPHYLOCOCCUS AUREUS RARE PSEUDOMONAS AERUGINOSA CRITICAL RESULT CALLED TO, READ BACK BY AND VERIFIED WITH: RN [email or web address].B AT 1207 ON 10/09/2022 BY  T.SAAD. NO ANAEROBES ISOLATED Performed at [facility], [address], [place], [place] [address]    Report Status 10/13/2022 FINAL  Final   Organism ID, Bacteria STAPHYLOCOCCUS AUREUS  Final   Organism ID, Bacteria PSEUDOMONAS AERUGINOSA  Final      Susceptibility   Pseudomonas aeruginosa - MIC*    CEFTAZIDIME 4 SENSITIVE Sensitive     CIPROFLOXACIN <=0.25 SENSITIVE Sensitive     GENTAMICIN <=1 SENSITIVE Sensitive     IMIPENEM 1 SENSITIVE Sensitive     PIP/TAZO 16 SENSITIVE Sensitive     CEFEPIME 2 SENSITIVE Sensitive     * RARE PSEUDOMONAS AERUGINOSA   Staphylococcus aureus - MIC*    CIPROFLOXACIN <=0.5 SENSITIVE Sensitive     ERYTHROMYCIN >=8 RESISTANT Resistant     GENTAMICIN <=0.5 SENSITIVE Sensitive     OXACILLIN 0.5 SENSITIVE Sensitive     TETRACYCLINE >=16 RESISTANT Resistant     VANCOMYCIN <=0.5 SENSITIVE Sensitive     TRIMETH/SULFA <=10 SENSITIVE Sensitive     CLINDAMYCIN >=8 RESISTANT Resistant     RIFAMPIN <=0.5 SENSITIVE Sensitive     Inducible Clindamycin NEGATIVE Sensitive     LINEZOLID 2 SENSITIVE Sensitive     * RARE STAPHYLOCOCCUS AUREUS  Aerobic/Anaerobic Culture w Gram Stain (surgical/deep wound)     Status: None   Collection Time: 10/08/22  4:48 PM   Specimen: Path Tissue  Result Value Ref Range Status   Specimen Description TISSUE LEFT THIGH  Final   Special Requests PT ON CEFEPIME,LINEZOLID, FLAGYL  Final   Gram Stain   Final    RARE WBC PRESENT, PREDOMINANTLY PMN NO ORGANISMS SEEN Gram Stain Report Called to,Read Back By and Verified With: RN Budd Palmer [phone] @1740  FH    Culture   Final    RARE DIPHTHEROIDS(CORYNEBACTERIUM SPECIES) Standardized susceptibility testing for this organism is not available. NO ANAEROBES ISOLATED Performed at [facility], [address] [place], [place] [address]    Report Status 10/13/2022 FINAL  Final     Marcos Eke, NP [facility]  10/16/2022  10:43 AM

## 2022-10-16 NOTE — Progress Notes (Signed)
Pharmacy Antibiotic Note  Kelli Hudson is a 32 y.o. female admitted on 10/03/2022 with sepsis and cellulitis.  Pharmacy has been consulted for linezolid, metronidazole, cefepime dosing. Renal function has been unstable, currently being dosed for estimated CrCl of 60.6 mL/min with Scr of 1.69 mg/dL. Currently without source control so duration of therapy is to be determined.   Renal function is improving, recurrent fevering through acetaminophen, increasing leukocytosis. Will plan to increase dose of cefepime for adequate pseudomonal coverage and watch renal function closely.   Plan: Increase to cefepime 2 g every 8 hours Continue linezolid 600 mg every 12 hours Continue metronidazole 500 mg every 12 hours F/u source control after OR on 8/30 Monitor renal function and clinical status  Height: 5\' 7"  (170.2 cm) Weight: 106.7 kg (235 lb 3.7 oz) IBW/kg (Calculated) : 61.6  Temp (24hrs), Avg:100.6 F (38.1 C), Min:98.6 F (37 C), Max:102 F (38.9 C)  Recent Labs  Lab 10/12/22 1732 10/12/22 2206 10/13/22 0325 10/14/22 0325 10/15/22 0524 10/16/22 0254  WBC 46.4* 42.8*  --  40.6* 41.2* 47.7*  CREATININE 1.96*  --  1.96* 1.74* 1.90* 1.69*    Estimated Creatinine Clearance: 60.6 mL/min (A) (by C-G formula based on SCr of 1.69 mg/dL (H)).    Allergies  Allergen Reactions   Ibuprofen Other (See Comments)    07/23/22 - Worsening kidney function after 3 doses of ibuprofen 400mg .  Creatinine increased from 1.3 to 2.  Creatinine returned to normal after stopping.   Lisinopril Cough, Other (See Comments), Itching and Rash    Breakout in rash    Antimicrobials this admission: Outpt Doxy 8/8 x 10 days Cefepime 8/16 > Vancomycin 8/16 > 8/21 Flagyl 8/16>> Zyvox 8/20 >> 8/24; 8/25 >>  Dose Adjustments: 8/29 cefepime 2g q12h >> 2g q8h  Microbiology results: 8/16 Bcx: NGF 8/16 resp panel: negative 8/18 R Foot Wound: ngtd 8/21 R calf tissue: staph aureus, MRSE 8/21 R foot  tissue: MSSA, PsA (pan-susceptible)  Thank you for allowing pharmacy to be a part of this patient's care.  Romie Minus, PharmD PGY1 Pharmacy Resident  Please check AMION for all [facility] Pharmacy phone numbers After 10:00 PM, call Main Pharmacy [phone]

## 2022-10-16 NOTE — Plan of Care (Signed)
Pt was upset, Clinical research associate asked patient if there was anything she needed and she mumbled something under her breath that she "didn't want to be here anymore", Clinical research associate asked patient if she meant in the hospital or not to live?", pt responded, "I dont want to live". Writer asked, pt if she would like to speak to the [place], and she responded yes, [place] in to speak and pray with patient. Pt placed on suicide precautions with sitter, MD notified, N.O. received. Pt medicated with PRN Dilaudid  for pain. Wound vacs  patent and intact, cycling at 125 mmhg, with serousanguinous output. Legs and thigh dressings intact. Pt Tmax 102.0 foley temp, scheduled Tylenol with some effect. Repeat Temp 100.4. No c/o of nausea or vomiting. D10W started to maintain blood sugar. Pt reported to be weepy at times, reassurance given by Nurse(s) and tech with some effect. Will continue to monitor.

## 2022-10-16 NOTE — Progress Notes (Signed)
NAME:  Kelli Hudson, MRN:  [id number], DOB:  06/15/1990, LOS: 13 ADMISSION DATE:  10/03/2022, CONSULTATION DATE:  10/08/22 REFERRING MD:  Lajoyce Corners, CHIEF COMPLAINT: nec fasc   History of Present Illness:   [name] PMH Dm1 diabetic gastroparesis now s/p gastric pacemaker, HTN admitted 8/17 with leg swelling -- BLE foot wounds, which  were being followed outpt by podiatry. Admitted to [facility] for sepsis in this setting, OR with podiatry 8/18 for I&D BLE foot wounds.   ID was consulted 8/20 for ongoing fever. Concern for nec fasc 8/21 ortho consulted -- transferred to [facility], underwent excision and debridement of BLE --extending feet to thighs. EBL from this case is not recorded but pt did receive 4 PRBC 2 FFP as well as crystalloid + colloid, as well as pressors for hemodynamic support .  Remains intubated after case. PCCM consulted for ICU management.   Pertinent  Medical History  DM1 Diabetic gastroparesis S/p gastric pacemaker  HTN  Significant Hospital Events: Including procedures, antibiotic start and stop dates in addition to other pertinent events   8/17 admit to [facility] sepsis BLE wound infections. Podiatry consult 8/18 I&D with podiatry.  8/20 ID consult  8/21 ortho consult. OR for excision and debridement of BLE nec fasc-- feet to thighs. Remained intubated, PCCM consulted.  4 PRBC 2 FFP during case. 8/23-extubated 8/26 Back to OR for wound vac change.   Interim History / Subjective:   Febrile 102 overnight.  WBC counts slightly worse Remains off pressors. Worsening suicidal thoughts and depression  Objective   Blood pressure (!) 113/53, pulse (!) 108, temperature (!) 100.4 F (38 C), temperature source Bladder, resp. rate 12, height 5\' 7"  (1.702 m), weight 106.7 kg, SpO2 98%.        Intake/Output Summary (Last 24 hours) at 10/16/2022 0829 Last data filed at 10/16/2022 0500 Gross per 24 hour  Intake 1028.87 ml  Output 4300 ml  Net -3271.13 ml   Filed Weights   10/11/22  0400 10/12/22 0500 10/14/22 0444  Weight: 103.1 kg 102.7 kg 106.7 kg    Examination: Gen:      No acute distress HEENT:  EOMI, sclera anicteric Neck:     No masses; no thyromegaly Lungs:    Clear to auscultation bilaterally; normal respiratory effort CV:         Regular rate and rhythm; no murmurs Abd:      + bowel sounds; soft, non-tender; no palpable masses, no distension Ext:    Feet in bandages, bilateral wound VAC to the thighs Skin:      Warm and dry; no rash Neuro: Awake  Lab/imaging reviewed WBC 47.7, hemoglobin 7.3, platelets 343 Sodium 134, BUN/creatinine 56/1.69  Resolved Hospital Problem list     Assessment & Plan:  Bilateral feet abscess, necrotizing fasciitis S/p bilateral excisional leg and thigh debridement Severe sepsis Continue cefepime, linezolid, Flagyl per ID Dilaudid PRN, oxy for pain control Discussed with Dr. Dion Saucier, orthopedics about worsening WBC count.  Plan to return to the OR tomorrow.  If she has worsening hemodynamic status then they can take her sooner tonight. Discontinue right IJ central line.  Continue A-line and PICC line for now Foley replaced yesterday. Follow repeat cultures  Acute blood loss anemia due to operative blood loss Monitor CBC.  Transfuse as needed  Type 1 diabetes Uncontrolled diabetes Continue Levemir SSI to moderate scale, added tube feed coverage  Protein calorie malnutrition Continue tube feeds.   History of gastroparesis Status post gastric pacemaker  placement Continue Reglan Attempted clears 8/28 but she had a bout of vomiting.  Keep n.p.o. for now.  Hypertension Hyperlipidemia Hold antihypertensive medications  Leukocytosis Antibiotics as above.  Monitor CBC  Depression, suicidal ideation Consult psychiatry  Remains critically ill  Best Practice (right click and "Reselect all SmartList Selections" daily)   Diet/type: tubefeeds DVT prophylaxis: prophylactic heparin  GI prophylaxis: PPI Lines:  Central line and Arterial Line Foley:  Yes, and it is still needed Code Status:  full code Last date of multidisciplinary goals of care discussion [pending]  Critical care time:   The patient is critically ill with multiple organ system failure and requires high complexity decision making for assessment and support, frequent evaluation and titration of therapies, advanced monitoring, review of radiographic studies and interpretation of complex data.   Critical Care Time devoted to patient care services, exclusive of separately billable procedures, described in this note is 35 minutes.   [name] MD [facility] See Amion for pager  If no response to pager , please call [phone] until 7pm After 7:00 pm call Elink  [phone] 10/16/2022, 8:29 AM

## 2022-10-16 NOTE — Consult Note (Addendum)
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 female admitted medically on 10/03/2022  1:28 PM for worsening infections of bilateral foot wounds. She has a past psychiatric history of depression and anxiety and has a past medical history of  DM, DKA, HTN. Psychiatry was consulted for SI, MDD by Dr. Isaiah Serge on 8/29.    Her current presentation of passive SI and some depression in the setting of her medical condition is most consistent with depression due to a medical condition. Patient may be minimizing her symptoms as she denies depressive symptoms and states that when she spoke with the chaplain, she was venting about her medical condition and made statements like wanting to die. She states she did not actually mean those statements and has no intention of acting on those thoughts. She currently denies SI, HI, and AVH.  Current outpatient psychotropic medications include Cymbalta and states she takes this medication for neuropathic pain and not for depression. She was compliant with medications prior to admission as evidenced by patient report. On initial examination, patient is able to contract for safety, stating if she has SI, she would inform the staff. She states her protective factors include her mother and boyfriend. Based on this information, she is not in imminent danger to herself or others, not meeting criteria for inpatient psychiatry. Discussed palliative care seeing her and she is not interested at this time. We confirmed with the patient's boyfriend that he has no safety concerns of the patient returning home in the future and confirmed there are no illicit substances nor will the patient have access to the gun at home. Please see plan below for detailed recommendations.   Diagnoses:  Active Hospital  problems: Principal Problem:   Necrotizing fasciitis (HCC) Active Problems:   Anemia   Sepsis (HCC)   AKI (acute kidney injury) (HCC)   Diabetic gastroparesis associated with type 1 diabetes mellitus (HCC)   DM type 1 (diabetes mellitus, type 1) (HCC)   Drug-seeking behavior   Sepsis due to cellulitis (HCC)   Cellulitis of lower extremity   Necrotizing fasciitis of pelvic region and thigh (HCC)   Necrotizing fasciitis of lower leg (HCC)     Plan   ## Psychiatric Medication Recommendations:  -- Agree to continue home Cymbalta 30 mg for neuropathic pain and depression  ## Medical Decision Making Capacity:  Capacity was not formally addressed during this encounter; however, the patient appeared to understand and participate in the discussion about their treatment plan.   ## Further Work-up:  -- per primary -- most recent EKG on 8/21 had QtC of 411 -- Pertinent labwork reviewed earlier this admission includes: Hgb 10.6, Na 134, Cr 1.69  ## Disposition:  -- There are no current psychiatric contraindications to discharge at this time  ## Behavioral / Environmental:  --  Utilize compassion and acknowledge the patient's experiences while setting clear and realistic expectations for care.    ## Safety and Observation Level:  - Based on my clinical evaluation, I estimate the patient to be at low risk of self harm in the current setting - At this time, we recommend a routine level of observation. This decision is based on my review of the chart including patient's history and current presentation, interview of the patient, mental status examination, and consideration of suicide risk  including evaluating suicidal ideation, plan, intent, suicidal or self-harm behaviors, risk factors, and protective factors. This judgment is based on our ability to directly address suicide risk, implement suicide prevention strategies and develop a safety plan while the patient is in the clinical setting.  Please contact our team if there is a concern that risk level has changed.  Suicide risk assessment  Patient has following modifiable risk factors for suicide: access to guns, which we are addressing by confirming with boyfriend of stopping access to weapons   Patient has following non-modifiable or demographic risk factors for suicide:   Patient has the following protective factors against suicide: Supportive family, Supportive friends, no history of suicide attempts, and no history of NSSIB   Thank you for this consult request. Recommendations have been communicated to the primary team.  We will sign off at this time.   Lance Muss, MD  Psychiatric and Social History   Relevant Aspects of Hospital Course:  Admitted on 10/03/2022 for worsening infections of bilateral foot wounds.  Patient Report:  Patient seen this morning.  Patient reports speaking to the chaplain yesterday and made statements like she did not want to live anymore.  She states her stressors include her medical condition of diabetes and her foot wounds. She notes feeling low She feels her depression has not been worsening recently and she has unsure why psychiatry was consulted.  She currently denies active and passive SI.  She states that if she were to have these types of thoughts that she would tell staff.  She states that she would not attempt suicide because of her family specifically her mom who is a big part of her support system along with her boyfriend of 3 years.  She reports her anxiety is not an issue for her and states that she is able to function through her day.  Denies HI, AVH.  She refused for the provider to obtain collateral and states "I want this to be private and not involve my home". She ageed for [place] to ask her boyfriend safety questions. Patient called boyfriend on the phone and the team spoke with him and he confirmed that he has no safety concerns of the patient returning home and that there are no  illicit substance at home. He also agreed to lock away the gun and not give the patient access to the weapon.  Denies symptoms of mania, psychosis, and trauma.  Psychiatric History:  Information collected from patient  Prev Dx/Sx: Denies (per chart review, depression and anxiety) Current Psych Provider: Denies Current Meds: Cymbalta (states this is not intended for depression but rather neuropathic pain) Previous Med Trials: Denies Therapy: Denies  Prior Psych Hospitalization: Denies  Prior Self Harm: Denies Prior Violence: Denies  Family Psych History: Denies Family Hx suicide: Denies  Social History:  Educational Hx: Engineer, maintenance Hx: unemployed, on disability Legal Hx: Denies Living Situation: Lives in [place] with boyfriend Access to weapons: Yes, she notes the gun is locked in a safe. Her boyfriend who lives with her agreed to lock away the gun and not give the patient access to the weapon.   Tobacco use: Denies Alcohol use: Denies Drug use: Denies   Exam Findings   Psychiatric Specialty Exam:  Presentation  General Appearance: Appropriate for Environment (in hospital gown)  Eye Contact:Poor  Speech:Clear and Coherent  Speech Volume:Normal  Handedness:No data recorded  Mood and Affect  Mood:Depressed  Affect:Congruent   Thought Process  Thought Processes:Coherent  Descriptions  of Associations:Intact  Orientation:Full (Time, Place and Person)  Thought Content:Logical  History of Schizophrenia/Schizoaffective disorder:No data recorded Duration of Psychotic Symptoms:No data recorded Hallucinations:Hallucinations: None  Ideas of Reference:None  Suicidal Thoughts:Suicidal Thoughts: No  Homicidal Thoughts:Homicidal Thoughts: No   Sensorium  Memory:Immediate Good; Recent Good; Remote Good  Judgment:Fair  Insight:Fair   Executive Functions  Concentration:Good  Attention Span:Good  Recall:Good  Fund of  Knowledge:Good  Language:Good   Psychomotor Activity  Psychomotor Activity:Psychomotor Activity: Normal   Assets  Assets:Social Support; Manufacturing systems engineer; Desire for Improvement; Housing; Resilience   Sleep  Sleep:Sleep: Fair    Physical Exam: Vital signs:  Temp:  [99.3 F (37.4 C)-102 F (38.9 C)] 101.1 F (38.4 C) (08/29 1114) Pulse Rate:  [96-120] 100 (08/29 1114) Resp:  [5-17] 10 (08/29 1114) BP: (90-113)/(41-54) 109/54 (08/29 1114) SpO2:  [87 %-100 %] 100 % (08/29 1114) Arterial Line BP: (88-170)/(43-83) 97/53 (08/29 1000) Physical Exam Constitutional:      Appearance: She is not ill-appearing.  HENT:     Head: Normocephalic and atraumatic.  Neurological:     Mental Status: She is alert.    Blood pressure (!) 109/54, pulse 100, temperature (!) 101.1 F (38.4 C), temperature source Bladder, resp. rate 10, height 5\' 7"  (1.702 m), weight 106.7 kg, SpO2 100%. Body mass index is 36.84 kg/m.   Other History   These have been pulled in through the EMR, reviewed, and updated if appropriate.   Family History:   The patient's family history includes Bipolar disorder in her father; Breast cancer in an other family member; Diabetes in her maternal aunt and mother; Heart disease in an other family member; Kidney disease in an other family member; Liver cancer in her maternal grandfather; Lung cancer in her mother; Ovarian cancer in an other family member; Pancreatic cancer in her maternal uncle; Prostate cancer in her maternal uncle.  Medical History: Past Medical History:  Diagnosis Date   Acute H. 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   Procedure: BILATERAL LEG DEBRIDEMENT;  Surgeon: Nadara Mustard, MD;  [facility];  Service: Orthopedics;  Laterality: Bilateral;   AMPUTATION TOE Left 03/10/2018   Procedure: AMPUTATION FIFTH TOE;  Surgeon: Vivi Barrack, DPM;  Location: [facility];  Service: Podiatry;  Laterality: Left;   APPLICATION OF WOUND VAC Bilateral 10/12/2022   Procedure: APPLICATION OF WOUND VAC BILATERAL LEGS;  Surgeon: Myrene Galas, MD;  Location: [facility] OR;  Service: Orthopedics;  Laterality: Bilateral;   BIOPSY  01/28/2020   Procedure: BIOPSY;  Surgeon: Kathi Der, MD;  Location: [facility];  Service: Gastroenterology;;   BOTOX INJECTION  08/26/2021   Procedure: BOTOX INJECTION;  Surgeon: Sherrilyn Rist, MD;  Location: [facility];  Service: Gastroenterology;;   ESOPHAGOGASTRODUODENOSCOPY N/A 01/28/2020   Procedure: ESOPHAGOGASTRODUODENOSCOPY (EGD);  Surgeon: Kathi Der, MD;  Location: [facility];  Service: Gastroenterology;  Laterality: N/A;   ESOPHAGOGASTRODUODENOSCOPY (EGD) WITH PROPOFOL Left 09/08/2015   Procedure: ESOPHAGOGASTRODUODENOSCOPY (EGD) WITH PROPOFOL;  Surgeon: Willis Modena, MD;  Location: [facility];  Service: Endoscopy;  Laterality: Left;   ESOPHAGOGASTRODUODENOSCOPY (EGD) WITH PROPOFOL N/A 08/26/2021   Procedure: ESOPHAGOGASTRODUODENOSCOPY (EGD) WITH PROPOFOL;  Surgeon: Sherrilyn Rist, MD;  Location: [facility];  Service: Gastroenterology;  Laterality: N/A;   GASTRIC STIMULATOR IMPLANT SURGERY  06/2022   at [facility]   I & D EXTREMITY Bilateral 10/12/2022   Procedure: IRRIGATION AND  DEBRIDEMENT  BILATERAL LEGS;  Surgeon: Myrene Galas, MD;  Location: [facility] OR;  Service: Orthopedics;  Laterality: Bilateral;   I & D EXTREMITY Right 10/13/2022   Procedure: RIGHT THIGH WOUND VAC EXCHANGE;  Surgeon: Roby Lofts, MD;  Location: [facility] OR;  Service:  Orthopedics;  Laterality: Right;   IRRIGATION AND DEBRIDEMENT FOOT Bilateral 10/05/2022   Procedure: IRRIGATION AND DEBRIDEMENT FOOT;  Surgeon: [name], DPM;  Location: [facility];  Service: Orthopedics/Podiatry;  Laterality: Bilateral;   WISDOM TOOTH EXTRACTION      Medications:   Current Facility-Administered Medications:    0.9 %  sodium chloride infusion, , Intravenous, PRN, West Bali, PA-C, Stopped at 10/13/22 1247   acetaminophen (TYLENOL) tablet 1,000 mg, 1,000 mg, Oral, Q6H, Knute Neu, RPH, 1,000 mg at 10/16/22 5784   acetaminophen (TYLENOL) tablet 650 mg, 650 mg, Oral, Q6H PRN, Paliwal, Aditya, MD   alum & mag hydroxide-simeth (MAALOX/MYLANTA) 200-200-20 MG/5ML suspension 15-30 mL, 15-30 mL, Oral, Q2H PRN, McClung, Sarah A, PA-C   ascorbic acid (VITAMIN C) tablet 500 mg, 500 mg, Oral, Daily, Knute Neu, RPH, 500 mg at 10/16/22 1055   bisacodyl (DULCOLAX) EC tablet 5 mg, 5 mg, Oral, Daily PRN, Sharon Seller, Sarah A, PA-C   ceFEPIme (MAXIPIME) 2 g in sodium chloride 0.9 % 100 mL IVPB, 2 g, Intravenous, Q8H, Moody, Savannah B, RPH, Last Rate: 200 mL/hr at 10/16/22 1103, 2 g at 10/16/22 1103   Chlorhexidine Gluconate Cloth 2 % PADS 6 each, 6 each, Topical, Daily, [place] Bali, PA-C, 6 each at 10/16/22 1055   copper tablet 4 mg, 4 mg, Oral, Daily, Knute Neu, RPH, 4 mg at 10/16/22 1054   DULoxetine (CYMBALTA) DR capsule 30 mg, 30 mg, Oral, Daily, Mannam, Praveen, MD, 30 mg at 10/16/22 1212   feeding supplement (PROSource TF20) liquid 60 mL, 60 mL, Per Tube, Daily, Thyra Breed A, PA-C, 60 mL at 10/16/22 1053   feeding supplement (VITAL 1.5 CAL) liquid 1,000 mL, 1,000 mL, Per Tube, Continuous, McClung, Sarah A, PA-C, Last Rate: 20 mL/hr at 10/16/22 1158, 1,000 mL at 10/16/22 1158   fiber supplement (BANATROL TF) liquid 60 mL, 60 mL, Per Tube, BID, [place] Bali, PA-C, 60 mL at 10/16/22 1054   folic acid (FOLVITE) tablet 1 mg, 1 mg, Oral, Daily, Knute Neu, RPH, 1 mg  at 10/16/22 1054   gabapentin (NEURONTIN) capsule 300 mg, 300 mg, Oral, TID, Knute Neu, RPH, 300 mg at 10/16/22 1055   guaiFENesin-dextromethorphan (ROBITUSSIN DM) 100-10 MG/5ML syrup 15 mL, 15 mL, Oral, Q4H PRN, Knute Neu, RPH   heparin injection 5,000 Units, 5,000 Units, Subcutaneous, Q8H, West Bali, PA-C, 5,000 Units at 10/16/22 0549   hydrALAZINE (APRESOLINE) injection 5 mg, 5 mg, Intravenous, Q20 Min PRN, West Bali, PA-C, 5 mg at 10/14/22 2312   HYDROmorphone (DILAUDID) injection 1 mg, 1 mg, Intravenous, Q3H PRN, Paliwal, Aditya, MD, 1 mg at 10/16/22 1158   insulin aspart (novoLOG) injection 0-15 Units, 0-15 Units, Subcutaneous, Q4H, Mannam, Praveen, MD, 8 Units at 10/15/22 1544   insulin detemir (LEVEMIR) injection 5 Units,  5 Units, Subcutaneous, BID, West Bali, PA-C, 5 Units at 10/15/22 1017   linezolid (ZYVOX) tablet 600 mg, 600 mg, Oral, Q12H, Knute Neu, RPH, 600 mg at 10/16/22 1054   methocarbamol (ROBAXIN) tablet 1,000 mg, 1,000 mg, Oral, TID, Knute Neu, RPH, 1,000 mg at 10/16/22 1054   metoCLOPramide (REGLAN) tablet 10 mg, 10 mg, Per Tube, Q8H, Mannam, Praveen, MD, 10 mg at 10/16/22 1054   metoprolol tartrate (LOPRESSOR) injection 2-5 mg, 2-5 mg, Intravenous, Q2H PRN, McClung, Sarah A, PA-C   metroNIDAZOLE (FLAGYL) tablet 500 mg, 500 mg, Oral, Q12H, Knute Neu, RPH, 500 mg at 10/16/22 1055   multivitamin with minerals tablet 1 tablet, 1 tablet, Oral, Daily, Knute Neu, RPH, 1 tablet at 10/16/22 1055   norepinephrine (LEVOPHED) 4mg  in (0.016 mg/mL) premix infusion, 0-40 mcg/min, Intravenous, Titrated, Mannam, Praveen, MD, Last Rate: 7.5 mL/hr at 10/16/22 1206, 2 mcg/min at 10/16/22 1206   nutrition supplement (JUVEN) (JUVEN) powder packet 1 packet, 1 packet, Per Tube, BID BM, West Bali, PA-C, 1 packet at 10/16/22 1054   ondansetron (ZOFRAN) injection 4 mg, 4 mg, Intravenous, Q6H PRN, West Bali, PA-C, 4 mg at 10/15/22 1607    Oral care mouth rinse, 15 mL, Mouth Rinse, 4 times per day, West Bali, PA-C, 15 mL at 10/16/22 1201   Oral care mouth rinse, 15 mL, Mouth Rinse, PRN, McClung, Sarah A, PA-C   oxyCODONE (Oxy IR/ROXICODONE) immediate release tablet 5 mg, 5 mg, Oral, Q6H, Knute Neu, RPH, 5 mg at 10/16/22 1054   oxyCODONE (Oxy IR/ROXICODONE) immediate release tablet 5-10 mg, 5-10 mg, Per Tube, Q4H PRN, [place] Bali, PA-C, 10 mg at 10/16/22 0819   pantoprazole (PROTONIX) injection 40 mg, 40 mg, Intravenous, Q12H, Mannam, Praveen, MD, 40 mg at 10/16/22 1102   phenol (CHLORASEPTIC) mouth spray 1 spray, 1 spray, Mouth/Throat, PRN, McClung, Sarah A, PA-C   polyethylene glycol (MIRALAX / GLYCOLAX) packet 17 g, 17 g, Per Tube, Daily, McClung, Sarah A, PA-C, 17 g at 10/16/22 1054   polyethylene glycol (MIRALAX / GLYCOLAX) packet 17 g, 17 g, Oral, Daily PRN, Knute Neu, RPH   sodium chloride flush (NS) 0.9 % injection 10-40 mL, 10-40 mL, Intracatheter, Q12H, McClung, Sarah A, PA-C, 10 mL at 10/16/22 1056   sodium chloride flush (NS) 0.9 % injection 10-40 mL, 10-40 mL, Intracatheter, PRN, McClung, Sarah A, PA-C   zinc sulfate capsule 220 mg, 220 mg, Oral, Daily, Knute Neu, RPH, 220 mg at 10/16/22 1054  Allergies: Allergies  Allergen Reactions   Ibuprofen Other (See Comments)    07/23/22 - Worsening kidney function after 3 doses of ibuprofen 400mg .  Creatinine increased from 1.3 to 2.  Creatinine returned to normal after stopping.   Lisinopril Cough, Other (See Comments), Itching and Rash    Breakout in rash

## 2022-10-16 NOTE — Progress Notes (Signed)
Pt refusing to reposition/turn. Pt states she prefers to be on left side. Pt educated about importance of turing and the risk of skin breakdown. Pt continues to refuse at this time.

## 2022-10-17 ENCOUNTER — Encounter (HOSPITAL_COMMUNITY): Payer: Self-pay | Admitting: Orthopedic Surgery

## 2022-10-17 ENCOUNTER — Inpatient Hospital Stay (HOSPITAL_COMMUNITY): Payer: Medicaid Other | Admitting: Anesthesiology

## 2022-10-17 ENCOUNTER — Other Ambulatory Visit: Payer: Self-pay

## 2022-10-17 ENCOUNTER — Encounter (HOSPITAL_COMMUNITY)
Admission: EM | Disposition: A | Discharge status: Skilled Nursing Facility | Payer: Self-pay | Source: Home / Self Care | Attending: Internal Medicine

## 2022-10-17 DIAGNOSIS — I739 Peripheral vascular disease, unspecified: Secondary | ICD-10-CM

## 2022-10-17 DIAGNOSIS — I10 Essential (primary) hypertension: Secondary | ICD-10-CM | POA: Diagnosis not present

## 2022-10-17 DIAGNOSIS — E109 Type 1 diabetes mellitus without complications: Secondary | ICD-10-CM | POA: Diagnosis not present

## 2022-10-17 DIAGNOSIS — N179 Acute kidney failure, unspecified: Secondary | ICD-10-CM | POA: Diagnosis not present

## 2022-10-17 DIAGNOSIS — M726 Necrotizing fasciitis: Secondary | ICD-10-CM | POA: Diagnosis not present

## 2022-10-17 DIAGNOSIS — E1043 Type 1 diabetes mellitus with diabetic autonomic (poly)neuropathy: Secondary | ICD-10-CM | POA: Diagnosis not present

## 2022-10-17 DIAGNOSIS — E1022 Type 1 diabetes mellitus with diabetic chronic kidney disease: Secondary | ICD-10-CM | POA: Diagnosis not present

## 2022-10-17 HISTORY — PX: I & D EXTREMITY: SHX5045

## 2022-10-17 LAB — CBC
HCT: 19.7 % — ABNORMAL LOW (ref 36.0–46.0)
HCT: 26 % — ABNORMAL LOW (ref 36.0–46.0)
Hemoglobin: 6.3 g/dL — CL (ref 12.0–15.0)
Hemoglobin: 8.8 g/dL — ABNORMAL LOW (ref 12.0–15.0)
MCH: 27.5 pg (ref 26.0–34.0)
MCH: 30 pg (ref 26.0–34.0)
MCHC: 32 g/dL (ref 30.0–36.0)
MCHC: 33.8 g/dL (ref 30.0–36.0)
MCV: 86 fL (ref 80.0–100.0)
MCV: 88.7 fL (ref 80.0–100.0)
Platelets: 382 10*3/uL (ref 150–400)
Platelets: 347 10*3/uL (ref 150–400)
RBC: 2.29 MIL/uL — ABNORMAL LOW (ref 3.87–5.11)
RBC: 2.93 MIL/uL — ABNORMAL LOW (ref 3.87–5.11)
RDW: 17.9 % — ABNORMAL HIGH (ref 11.5–15.5)
RDW: 16.4 % — ABNORMAL HIGH (ref 11.5–15.5)
WBC: 51.7 10*3/uL (ref 4.0–10.5)
WBC: 54.3 10*3/uL (ref 4.0–10.5)
nRBC: 0.2 % (ref 0.0–0.2)
nRBC: 0.1 % (ref 0.0–0.2)

## 2022-10-17 LAB — BASIC METABOLIC PANEL
Anion gap: 4 — ABNORMAL LOW (ref 5–15)
BUN: 71 mg/dL — ABNORMAL HIGH (ref 6–20)
CO2: 18 mmol/L — ABNORMAL LOW (ref 22–32)
Calcium: 8 mg/dL — ABNORMAL LOW (ref 8.9–10.3)
Chloride: 113 mmol/L — ABNORMAL HIGH (ref 98–111)
Creatinine, Ser: 2.25 mg/dL — ABNORMAL HIGH (ref 0.44–1.00)
GFR, Estimated: 29 mL/min — ABNORMAL LOW (ref >60)
Glucose, Bld: 103 mg/dL — ABNORMAL HIGH (ref 70–99)
Potassium: 4.9 mmol/L (ref 3.5–5.1)
Sodium: 135 mmol/L (ref 135–145)

## 2022-10-17 LAB — PROTIME-INR
INR: 1.6 — ABNORMAL HIGH (ref 0.8–1.2)
Prothrombin Time: 19 seconds — ABNORMAL HIGH (ref 11.4–15.2)

## 2022-10-17 LAB — GLUCOSE, CAPILLARY
Glucose-Capillary: 95 mg/dL (ref 70–99)
Glucose-Capillary: 101 mg/dL — ABNORMAL HIGH (ref 70–99)
Glucose-Capillary: 110 mg/dL — ABNORMAL HIGH (ref 70–99)
Glucose-Capillary: 118 mg/dL — ABNORMAL HIGH (ref 70–99)
Glucose-Capillary: 125 mg/dL — ABNORMAL HIGH (ref 70–99)
Glucose-Capillary: 138 mg/dL — ABNORMAL HIGH (ref 70–99)
Glucose-Capillary: 111 mg/dL — ABNORMAL HIGH (ref 70–99)

## 2022-10-17 LAB — C DIFFICILE (CDIFF) QUICK SCRN (NO PCR REFLEX)
C Diff antigen: NEGATIVE
C Diff interpretation: NOT DETECTED
C Diff toxin: NEGATIVE

## 2022-10-17 LAB — PREPARE RBC (CROSSMATCH)

## 2022-10-17 LAB — APTT: aPTT: 37 seconds — ABNORMAL HIGH (ref 24–36)

## 2022-10-17 SURGERY — IRRIGATION AND DEBRIDEMENT EXTREMITY
Anesthesia: General | Site: Leg Lower | Laterality: Bilateral

## 2022-10-17 MED ORDER — CEFAZOLIN SODIUM-DEXTROSE 2-4 GM/100ML-% IV SOLN
INTRAVENOUS | Status: AC
  Filled 2022-10-17: qty 100

## 2022-10-17 MED ORDER — FENTANYL CITRATE (PF) 250 MCG/5ML IJ SOLN
INTRAMUSCULAR | Status: AC
  Filled 2022-10-17: qty 5

## 2022-10-17 MED ORDER — SUCCINYLCHOLINE CHLORIDE 200 MG/10ML IV SOSY
PREFILLED_SYRINGE | INTRAVENOUS | Status: DC | PRN
  Administered 2022-10-17: 120 mg via INTRAVENOUS

## 2022-10-17 MED ORDER — VASHE WOUND IRRIGATION OPTIME
TOPICAL | Status: DC | PRN
  Administered 2022-10-17 (×2): 34 [oz_av]

## 2022-10-17 MED ORDER — 0.9 % SODIUM CHLORIDE (POUR BTL) OPTIME
TOPICAL | Status: DC | PRN
  Administered 2022-10-17: 1000 mL

## 2022-10-17 MED ORDER — SODIUM CHLORIDE 0.9% IV SOLUTION: Freq: Once | INTRAVENOUS | Status: DC

## 2022-10-17 MED ORDER — LIDOCAINE 2% (20 MG/ML) 5 ML SYRINGE
INTRAMUSCULAR | Status: DC | PRN
  Administered 2022-10-17: 40 mg via INTRAVENOUS

## 2022-10-17 MED ORDER — LACTATED RINGERS IV SOLN
INTRAVENOUS | Status: DC | PRN
Start: 2022-10-17 — End: 2022-10-17

## 2022-10-17 MED ORDER — CHLORHEXIDINE GLUCONATE 4 % EX SOLN
60.0000 mL | Freq: Once | CUTANEOUS | Status: AC
  Administered 2022-10-17: 4 via TOPICAL

## 2022-10-17 MED ORDER — POVIDONE-IODINE 10 % EX SWAB
2.0000 | Freq: Once | CUTANEOUS | Status: AC
  Administered 2022-10-17: 2 via TOPICAL

## 2022-10-17 MED ORDER — PROMETHAZINE HCL 25 MG/ML IJ SOLN: 6.2500 mg | INTRAMUSCULAR | Status: DC | PRN

## 2022-10-17 MED ORDER — DEXMEDETOMIDINE HCL IN NACL 80 MCG/20ML IV SOLN
INTRAVENOUS | Status: DC | PRN
  Administered 2022-10-17 (×2): 8 ug via INTRAVENOUS

## 2022-10-17 MED ORDER — OXYCODONE HCL 5 MG/5ML PO SOLN: 5.0000 mg | Freq: Once | ORAL | Status: DC | PRN

## 2022-10-17 MED ORDER — SUGAMMADEX SODIUM 200 MG/2ML IV SOLN
INTRAVENOUS | Status: DC | PRN
  Administered 2022-10-17: 200 mg via INTRAVENOUS

## 2022-10-17 MED ORDER — ROCURONIUM BROMIDE 10 MG/ML (PF) SYRINGE
PREFILLED_SYRINGE | INTRAVENOUS | Status: DC | PRN
  Administered 2022-10-17: 30 mg via INTRAVENOUS

## 2022-10-17 MED ORDER — MIDAZOLAM HCL 2 MG/2ML IJ SOLN
INTRAMUSCULAR | Status: DC | PRN
  Administered 2022-10-17: 2 mg via INTRAVENOUS

## 2022-10-17 MED ORDER — ONDANSETRON HCL 4 MG/2ML IJ SOLN
INTRAMUSCULAR | Status: DC | PRN
  Administered 2022-10-17: 4 mg via INTRAVENOUS

## 2022-10-17 MED ORDER — PHENYLEPHRINE HCL-NACL 20-0.9 MG/250ML-% IV SOLN
INTRAVENOUS | Status: DC | PRN
  Administered 2022-10-17: 30 ug/min via INTRAVENOUS

## 2022-10-17 MED ORDER — SODIUM CHLORIDE 0.9 % IV SOLN: 2.0000 g | Freq: Two times a day (BID) | INTRAVENOUS | Status: DC

## 2022-10-17 MED ORDER — PHENYLEPHRINE 80 MCG/ML (10ML) SYRINGE FOR IV PUSH (FOR BLOOD PRESSURE SUPPORT)
PREFILLED_SYRINGE | INTRAVENOUS | Status: DC | PRN
  Administered 2022-10-17: 240 ug via INTRAVENOUS
  Administered 2022-10-17: 160 ug via INTRAVENOUS

## 2022-10-17 MED ORDER — FAMOTIDINE IN NACL 20-0.9 MG/50ML-% IV SOLN
INTRAVENOUS | Status: AC
  Filled 2022-10-17: qty 50

## 2022-10-17 MED ORDER — FENTANYL CITRATE (PF) 250 MCG/5ML IJ SOLN
INTRAMUSCULAR | Status: DC | PRN
  Administered 2022-10-17 (×3): 50 ug via INTRAVENOUS

## 2022-10-17 MED ORDER — PROPOFOL 10 MG/ML IV BOLUS
INTRAVENOUS | Status: DC | PRN
Start: 2022-10-17 — End: 2022-10-17
  Administered 2022-10-17: 120 mg via INTRAVENOUS

## 2022-10-17 MED ORDER — SOD CITRATE-CITRIC ACID 500-334 MG/5ML PO SOLN
ORAL | Status: DC | PRN
  Administered 2022-10-17: 30 mL via ORAL

## 2022-10-17 MED ORDER — SOD CITRATE-CITRIC ACID 500-334 MG/5ML PO SOLN
ORAL | Status: AC
  Filled 2022-10-17: qty 30

## 2022-10-17 MED ORDER — LACTATED RINGERS IV SOLN: INTRAVENOUS | Status: DC

## 2022-10-17 MED ORDER — PROPOFOL 10 MG/ML IV BOLUS
INTRAVENOUS | Status: AC
  Filled 2022-10-17: qty 20

## 2022-10-17 MED ORDER — ORAL CARE MOUTH RINSE: 15.0000 mL | Freq: Once | OROMUCOSAL | Status: AC

## 2022-10-17 MED ORDER — VITAMIN A 3 MG (10000 UNIT) PO CAPS
10000.0000 [IU] | ORAL_CAPSULE | Freq: Every day | ORAL | Status: DC
  Administered 2022-10-18 – 2022-10-20 (×3): 10000 [IU] via ORAL
  Filled 2022-10-17 (×5): qty 1

## 2022-10-17 MED ORDER — FAMOTIDINE IN NACL 20-0.9 MG/50ML-% IV SOLN
INTRAVENOUS | Status: DC | PRN
  Administered 2022-10-17: 20 mg via INTRAVENOUS

## 2022-10-17 MED ORDER — SODIUM CHLORIDE 0.9 % IV SOLN: INTRAVENOUS | Status: DC

## 2022-10-17 MED ORDER — CEFAZOLIN SODIUM-DEXTROSE 2-4 GM/100ML-% IV SOLN
2.0000 g | INTRAVENOUS | Status: AC
  Administered 2022-10-17: 2 g via INTRAVENOUS

## 2022-10-17 MED ORDER — CHLORHEXIDINE GLUCONATE 0.12 % MT SOLN
OROMUCOSAL | Status: AC
  Administered 2022-10-17: 15 mL via OROMUCOSAL
  Filled 2022-10-17: qty 15

## 2022-10-17 MED ORDER — HYDROMORPHONE HCL 1 MG/ML IJ SOLN
0.2000 mg | INTRAMUSCULAR | Status: DC | PRN
  Administered 2022-10-17 – 2022-10-20 (×16): 0.2 mg via INTRAVENOUS
  Filled 2022-10-17 (×15): qty 0.5

## 2022-10-17 MED ORDER — SODIUM CHLORIDE 0.9 % IV SOLN
INTRAVENOUS | Status: DC | PRN
Start: 2022-10-17 — End: 2022-10-17

## 2022-10-17 MED ORDER — ALBUMIN HUMAN 5 % IV SOLN: INTRAVENOUS | Status: DC | PRN

## 2022-10-17 MED ORDER — CHLORHEXIDINE GLUCONATE 0.12 % MT SOLN: 15.0000 mL | Freq: Once | OROMUCOSAL | Status: AC

## 2022-10-17 MED ORDER — OXYCODONE HCL 5 MG PO TABS: 5.0000 mg | ORAL_TABLET | Freq: Once | ORAL | Status: DC | PRN

## 2022-10-17 MED ORDER — ACETAMINOPHEN 500 MG PO TABS: 1000.0000 mg | ORAL_TABLET | Freq: Once | ORAL | Status: DC

## 2022-10-17 MED ORDER — FENTANYL CITRATE (PF) 100 MCG/2ML IJ SOLN
INTRAMUSCULAR | Status: AC
  Filled 2022-10-17: qty 2

## 2022-10-17 MED ORDER — MIDAZOLAM HCL 2 MG/2ML IJ SOLN
INTRAMUSCULAR | Status: AC
  Filled 2022-10-17: qty 2

## 2022-10-17 MED ORDER — FENTANYL CITRATE (PF) 100 MCG/2ML IJ SOLN
25.0000 ug | INTRAMUSCULAR | Status: DC | PRN
  Administered 2022-10-17 (×4): 25 ug via INTRAVENOUS

## 2022-10-17 SURGICAL SUPPLY — 43 items
BAG COUNTER SPONGE SURGICOUNT (BAG) IMPLANT
BAG SPNG CNTER NS LX DISP (BAG) ×1
BLADE SURG 21 STRL SS (BLADE) ×1 IMPLANT
BNDG CMPR 5X6 CHSV STRCH STRL (GAUZE/BANDAGES/DRESSINGS) ×2
BNDG COHESIVE 6X5 TAN ST LF (GAUZE/BANDAGES/DRESSINGS) IMPLANT
BNDG GAUZE DERMACEA FLUFF 4 (GAUZE/BANDAGES/DRESSINGS) ×2 IMPLANT
BNDG GZE DERMACEA 4 6PLY (GAUZE/BANDAGES/DRESSINGS) ×4
CANISTER WOUNDNEG PRESSURE 500 (CANNISTER) IMPLANT
CLEANSER WND VASHE INSTL 34OZ (WOUND CARE) IMPLANT
CONNECTOR Y WND VAC (MISCELLANEOUS) IMPLANT
COVER SURGICAL LIGHT HANDLE (MISCELLANEOUS) ×2 IMPLANT
DRAPE DERMATAC (DRAPES) IMPLANT
DRAPE INCISE 23X17 STRL (DRAPES) IMPLANT
DRAPE INCISE IOBAN 23X17 STRL (DRAPES) ×3 IMPLANT
DRAPE U-SHAPE 47X51 STRL (DRAPES) ×1 IMPLANT
DRESSING PREVENA PLUS CUSTOM (GAUZE/BANDAGES/DRESSINGS) IMPLANT
DRSG ADAPTIC 3X8 NADH LF (GAUZE/BANDAGES/DRESSINGS) ×1 IMPLANT
DRSG PREVENA PLUS CUSTOM (GAUZE/BANDAGES/DRESSINGS) ×3
DURAPREP 26ML APPLICATOR (WOUND CARE) ×1 IMPLANT
ELECT REM PT RETURN 9FT ADLT (ELECTROSURGICAL) ×1
ELECTRODE REM PT RTRN 9FT ADLT (ELECTROSURGICAL) IMPLANT
GAUZE PAD ABD 8X10 STRL (GAUZE/BANDAGES/DRESSINGS) IMPLANT
GAUZE SPONGE 4X4 12PLY STRL (GAUZE/BANDAGES/DRESSINGS) ×1 IMPLANT
GLOVE BIOGEL PI IND STRL 9 (GLOVE) ×1 IMPLANT
GLOVE SURG ORTHO 9.0 STRL STRW (GLOVE) ×1 IMPLANT
GOWN STRL REUS W/ TWL XL LVL3 (GOWN DISPOSABLE) ×2 IMPLANT
GOWN STRL REUS W/TWL XL LVL3 (GOWN DISPOSABLE) ×2
GRAFT SKIN WND SURGICLOSE M95 (Tissue) IMPLANT
HANDPIECE INTERPULSE COAX TIP (DISPOSABLE)
KIT BASIN OR (CUSTOM PROCEDURE TRAY) ×1 IMPLANT
KIT TURNOVER KIT B (KITS) ×1 IMPLANT
MANIFOLD NEPTUNE II (INSTRUMENTS) ×1 IMPLANT
NS IRRIG 1000ML POUR BTL (IV SOLUTION) ×1 IMPLANT
PACK ORTHO EXTREMITY (CUSTOM PROCEDURE TRAY) ×1 IMPLANT
PAD ARMBOARD 7.5X6 YLW CONV (MISCELLANEOUS) ×2 IMPLANT
SET HNDPC FAN SPRY TIP SCT (DISPOSABLE) IMPLANT
STOCKINETTE IMPERVIOUS 9X36 MD (GAUZE/BANDAGES/DRESSINGS) IMPLANT
SUT ETHILON 2 0 PSLX (SUTURE) ×1 IMPLANT
SWAB COLLECTION DEVICE MRSA (MISCELLANEOUS) ×1 IMPLANT
SWAB CULTURE ESWAB REG 1ML (MISCELLANEOUS) IMPLANT
TOWEL GREEN STERILE (TOWEL DISPOSABLE) ×1 IMPLANT
TUBE CONNECTING 12X1/4 (SUCTIONS) ×1 IMPLANT
YANKAUER SUCT BULB TIP NO VENT (SUCTIONS) ×1 IMPLANT

## 2022-10-17 NOTE — Op Note (Signed)
10/17/2022  10:48 AM  PATIENT:  Kelli Hudson    PRE-OPERATIVE DIAGNOSIS:  Necrotizing Fasciitis, bilateral thighs, bilateral calfs, bilateral feet.  POST-OPERATIVE DIAGNOSIS:  Same  PROCEDURE:  BILATERAL THIGH AND LEG EXCISIONAL DEBRIDEMENT,  Excisional debridement BILATERAL FOOT  Application of Kerecis micro graft 95 cm x 4.  Local tissue rearrangement for wound closure bilateral thighs 50 x 10 cm. Local tissue rearrangement for bilateral calf wounds 20 x 5 cm. Application of customizable wound VAC sponges x 3.  SURGEON:  Nadara Mustard, MD  PHYSICIAN ASSISTANT:None ANESTHESIA:   General  PREOPERATIVE INDICATIONS:  Kelli Hudson is a  32 y.o. female with a diagnosis of Necrotizing Fasciitis who failed conservative measures and elected for surgical management.    The risks benefits and alternatives were discussed with the patient preoperatively including but not limited to the risks of infection, bleeding, nerve injury, cardiopulmonary complications, the need for revision surgery, among others, and the patient was willing to proceed.  OPERATIVE IMPLANTS:   Implant Name Type Inv. Item Serial No. Manufacturer Lot No. LRB No. Used Action  GRAFT SKIN WND SURGICLOSE M95 - [id number] Tissue GRAFT SKIN WND SURGICLOSE M95  [employer] [id number] Bilateral 1 Implanted  GRAFT SKIN WND SURGICLOSE M95 - [id number] Tissue GRAFT SKIN WND SURGICLOSE M95  [employer] [id number] Bilateral 1 Implanted  GRAFT SKIN WND SURGICLOSE M95 - [id number] Tissue GRAFT SKIN WND SURGICLOSE M95  [employer] [id number] Bilateral 1 Implanted  GRAFT SKIN WND SURGICLOSE M95 - [id number] Tissue GRAFT SKIN WND SURGICLOSE M95  [employer] [id number] Bilateral 1 Implanted    @ENCIMAGES @  OPERATIVE FINDINGS: The muscle and soft tissue was healthy and viable with good contractility.  The foot wounds had healthy bleeding granulation tissue no signs of abscess within the feet.  It does not appear  that patient's elevated white cell count is coming from her feet or legs at this time.  Patient presented to the operating room with watery diarrhea in the bed.  This was sent for C. difficile cultures.  This was discussed with Dr. Ilsa Iha.  OPERATIVE PROCEDURE: Patient was brought the operating room and underwent a general anesthetic.  After adequate levels anesthesia were obtained both lower extremities were prepped using DuraPrep draped into a sterile field a timeout was called.  Both feet were left out of the sterile field with impervious stockinette's until the leg wounds were closed.  The leg wounds were open bilaterally and both the thigh and calf had healthy granulation tissue.  The muscle had good contractility and color.  The wounds underwent excisional debridement on both lower extremities with a 21 blade knife with excision of skin soft tissue muscle and fascia.  The wounds were irrigated with Vashe irrigation.  The wound beds were filled with a total of 4 of the 95 cm Kerecis micro graft.  Local tissue rearrangement was used to close the wounds on both thigh and both calf with the thigh wounds 50 x 10 cm and the calf wounds 20 x 5 cm.  3 of the customizable wound VAC sponges were used to cover the wounds on both thighs and both calfs this had a good suction fit.  Once these wounds were covered attention was focused on both feet.  A 21 blade knife was used to excise skin soft tissue muscle and fascia the plantar aspect of both feet essentially covering three quarters of the plantar aspect of both feet.  The tissue had good petechial bleeding there is no purulent abscess  no necrotic tissue no drainage no evidence of any infection actively on both feet.  Both feet were then irrigated with Vashe irrigation and the wounds were packed open with Kerlix soaked Vashe.  Patient received total of 2 units of packed red blood cells and 1 bag of FFP.  She also received albumin.  Patient was extubated taken  the PACU in stable condition.   DISCHARGE PLANNING:  Stool was sent for C. difficile cultures.    Anticipate no further surgical debridement at this time.  Continue Vashe soaked dressing changes to both feet.

## 2022-10-17 NOTE — Anesthesia Preprocedure Evaluation (Addendum)
Anesthesia Evaluation  Patient identified by MRN, date of birth, ID band Patient awake    Reviewed: Allergy & Precautions, NPO status , Patient's Chart, lab work & pertinent test results  History of Anesthesia Complications Negative for: history of anesthetic complications  Airway Mallampati: II  TM Distance: >3 FB Neck ROM: Full   Comment: Tongue ring Dental  (+) Dental Advisory Given  Tongue piercing :   Pulmonary    Pulmonary exam normal        Cardiovascular hypertension, Pt. on medications + Peripheral Vascular Disease  Normal cardiovascular exam  Levophed BP support   Neuro/Psych  PSYCHIATRIC DISORDERS Anxiety Depression    negative neurological ROS     GI/Hepatic Neg liver ROS, PUD,GERD  Medicated and Poorly Controlled,, Feeding tube in distal stomach (pre-pyloric) Gastroparesis: has gastric stimulator   Endo/Other  diabetes, Insulin Dependent   Obesity   Renal/GU Renal InsufficiencyRenal disease     Musculoskeletal   Abdominal   Peds  Hematology  (+) Blood dyscrasia, anemia  Receiving PRBC transfusions for Hb <7 this morning    Anesthesia Other Findings Necrotizing fasciitis thigh s/p multiple debridements   Reproductive/Obstetrics                             Anesthesia Physical Anesthesia Plan  ASA: 4  Anesthesia Plan: General   Post-op Pain Management:    Induction: Intravenous and Rapid sequence  PONV Risk Score and Plan: 3 and Ondansetron, Dexamethasone, Treatment may vary due to age or medical condition and Midazolam  Airway Management Planned: Oral ETT  Additional Equipment: None  Intra-op Plan:   Post-operative Plan: Possible Post-op intubation/ventilation  Informed Consent: I have reviewed the patients History and Physical, chart, labs and discussed the procedure including the risks, benefits and alternatives for the proposed anesthesia with the  patient or authorized representative who has indicated his/her understanding and acceptance.     Dental advisory given  Plan Discussed with: CRNA and Anesthesiologist  Anesthesia Plan Comments: (Patient vomitted in preop. Giving bicitra, will RSI )        Anesthesia Quick Evaluation

## 2022-10-17 NOTE — Progress Notes (Signed)
Pharmacy Antibiotic Note  Kelli Hudson is a 32 y.o. female admitted on 10/03/2022 with necrotizing fasciitis  Pharmacy has been consulted for linezolid, metronidazole, cefepime dosing.   Noted underlying CKD (BL~1-1.4) with fluctuations in SCr noted this admission. SCr up to 2.25 today, warranting a Cefepime dose adjustment  Plan: - Adjust Cefepime to 2g IV every 12 hours - Continue Linezolid + Flagyl  - Will continue to follow renal function, culture results, LOT, and antibiotic de-escalation plans   Height: 5\' 7"  (170.2 cm) Weight: 106.7 kg (235 lb 3.7 oz) IBW/kg (Calculated) : 61.6  Temp (24hrs), Avg:98.5 F (36.9 C), Min:96.6 F (35.9 C), Max:100 F (37.8 C)  Recent Labs  Lab 10/12/22 2206 10/13/22 0325 10/14/22 0325 10/15/22 0524 10/16/22 0254 10/17/22 0444  WBC 42.8*  --  40.6* 41.2* 47.7* 51.7*  CREATININE  --  1.96* 1.74* 1.90* 1.69* 2.25*    Estimated Creatinine Clearance: 45.5 mL/min (A) (by C-G formula based on SCr of 2.25 mg/dL (H)).    Allergies  Allergen Reactions   Ibuprofen Other (See Comments)    07/23/22 - Worsening kidney function after 3 doses of ibuprofen 400mg .  Creatinine increased from 1.3 to 2.  Creatinine returned to normal after stopping.   Lisinopril Cough, Other (See Comments), Itching and Rash    Breakout in rash    Antimicrobials this admission: Outpt Doxy 8/8 x 10 days Cefepime 8/16 > Vancomycin 8/16 > 8/21 Flagyl 8/16>> Zyvox 8/20 >> 8/24; 8/25 >>  Dose Adjustments: 8/29 cefepime 2g q12h >> 2g q8h  Microbiology results: 8/16 Bcx: NGF 8/16 resp panel: negative 8/18 R Foot Wound: ngtd 8/21 L-thigh tissue >> rare corynebacterium 8/21 R calf tissue: staph aureus, MRSE 8/21 R foot tissue: MSSA, PsA (pan-susceptible)  Thank you for allowing pharmacy to be a part of this patient's care.  Georgina Pillion, PharmD, BCPS, BCIDP Infectious Diseases Clinical Pharmacist 10/17/2022 1:10 PM   **Pharmacist phone directory can now  be found on [email or web address] ([email or web address]).  Listed under [facility].

## 2022-10-17 NOTE — Progress Notes (Signed)
ID Brief Note  Patient in OR for bilateral thigh and foot debridement by Dr Lajoyce Corners  Fever curve trending down, WBC up to 54 Fu blood cx  Patient had a large BM on OR, C diff was checked and negative   Expect WBC to downtrend if leukocytosis related to lower extremities wth debridement  Dr Drue Second to check on this weekend  Odette Fraction, MD Infectious Disease Physician [facility] [address] [place], [place] [address] Phone: [phone]  Fax: [phone]

## 2022-10-17 NOTE — Anesthesia Postprocedure Evaluation (Signed)
Anesthesia Post Note  Patient: Network engineer  Procedure(s) Performed: BILATERAL THIGH AND LEG DEBRIDEMENT, PARTIAL BILATERAL FOOT AMPUTATION (Bilateral: Leg Lower)     Patient location during evaluation: PACU Anesthesia Type: General Level of consciousness: awake and alert Pain management: pain level controlled Vital Signs Assessment: post-procedure vital signs reviewed and stable Respiratory status: spontaneous breathing, nonlabored ventilation, respiratory function stable and patient connected to nasal cannula oxygen Cardiovascular status: blood pressure returned to baseline and stable Postop Assessment: no apparent nausea or vomiting Anesthetic complications: no   No notable events documented.  Last Vitals:  Vitals:   10/17/22 1130 10/17/22 1139  BP: 123/72 124/74  Pulse: (!) 105 (!) 105  Resp: 11 11  Temp:  (!) 36.2 C  SpO2: 96% 96%    Last Pain:  Vitals:   10/17/22 1139  TempSrc:   PainSc: Asleep                 Beryle Lathe

## 2022-10-17 NOTE — Progress Notes (Signed)
Pts blood arrived at 0755. Transport was at bedside to take pt to short stay. Blood was taken with pt to be administered there. Consent was not on chart and was obtained by short stay RN.

## 2022-10-17 NOTE — Progress Notes (Addendum)
Nutrition Follow-up  DOCUMENTATION CODES:  Not applicable  INTERVENTION:  Continue tube feeding via Cortrak. Resume at goal after OR: Vital 1.5 at 60 ml/h (1440 ml per day) Prosource TF20 60 ml 1x/d Provides 2240 kcal, 117 gm protein, 1100 ml free water daily Banatrol BID to aid with loose stools 1 packet Juven BID, each packet provides 95 calories, 2.5 grams of protein (collagen), and 9.8 grams of carbohydrate (3 grams sugar); also contains 7 grams of L-arginine and L-glutamine, 300 mg vitamin C, 15 mg vitamin E, 1.2 mcg vitamin B-12, 9.5 mg zinc, 200 mg calcium, and 1.5 g  Calcium Beta-hydroxy-Beta-methylbutyrate to support wound healing MVI with minerals daily Vitamin D 1000 international units x 30 days for deficiency Zinc 220mg  x 14 days for deficiency Vitamin C to 500mg  1x/d. Stop after 30 doses. No signs of deficiency per serum levels (previously ordered by MD) Copper 4mg  x 7 days for deficiency Vitamin A 10,000 x 7 days for deficiency. Must be given orally  NUTRITION DIAGNOSIS:  Increased nutrient needs related to wound healing as evidenced by estimated needs. - remains applicable  GOAL:  Patient will meet greater than or equal to 90% of their needs - progressing, being met with feeds at goal  MONITOR:  PO intake, Supplement acceptance  REASON FOR ASSESSMENT:  Consult Enteral/tube feeding initiation and management  ASSESSMENT:  32 y.o. female admits related to leg swelling. PMH includes: acute H. Pylori gastric ulcer, DM, gastroparesis, DKA, GERD, HTN, hyperthyroidism, normocytic anemia. Pt is currently receiving medical management related to sepsis due to cellulitis.  8/18 - OR, I&D of bilateral feet 8/21 - transferred to ICU, OR excisional debridement of bilateral legs (feet to thigh), 4 wound vacs placed in OR  8/23 - Cortrak tube placed, extubated  8/25 - OR excisional debridement of bilateral legs (feet to thigh 8/26 - back to OR for vac change 8/28 - SLP  evaluation, regular diet when alert 8/30 - OR excisional debridement of bilateral legs (feet to thigh). Three wound vacs applied.  Pt out of room at the time of assessment. Having repeat debridement in OR this AM.   Discussed in rounds. RN reports that pt had some vomiting after PO intake 8/28 and TF were running at a trickle yesterday. Will be resumed at full rate once she returns from OR.   RN notes that due to pt's level of pain and pain regimen, she is frequently lethargic. Would recommend keeping pt's TF running until she is able to meet all nutrition needs orally due to her high needs with wound healing.    Intake/Output Summary (Last 24 hours) at 10/17/2022 1411 Last data filed at 10/17/2022 1400 Gross per 24 hour  Intake 2615.17 ml  Output 2245 ml  Net 370.17 ml  Net IO Since Admission: 8,002.57 mL [10/17/22 1411] - Left leg wound vac: out x 24 hours - Right leg wound vac x2: out x 24 hours  Average Meal Intake: 8/17-8/20: 70% intake x 4 recorded meals 8/28: 100% x 1 meal 8/29: 0% x 1 meal  Nutritionally Relevant Medications: Scheduled Meds:  vitamin C  500 mg Daily   copper  4 mg Daily   (PROSource TF20)  60 mL Daily   (BANATROL TF)  60 mL BID   folic acid  1 mg Daily   insulin aspart  0-15 Units Q4H   insulin detemir  5 Units BID   linezolid  600 mg Q12H   metoCLOPramide  10 mg Q8H  multivitamin with minerals  1 tablet Daily   JUVEN  1 packet BID BM   pantoprazole  40 mg Q12H   polyethylene glycol  17 g Daily   zinc sulfate  220 mg Daily   Continuous Infusions:  feeding supplement (VITAL 1.5 CAL) Stopped (10/17/22 0030)   lactated ringers 75 mL/hr at 10/17/22 1327   PRN Meds: alum & mag hydroxide-simeth, bisacodyl, ondansetron, phenol, polyethylene glycol  Labs Reviewed: BUN 71, creatinine 2.25 CBG ranges from 82-180 mg/dL over the last 24 hours HgbA1c 8.8% (5/1)  Micronutrient Profile: CRP 17.2 (high, may skew micronutrient results) Vitamin  D: 21.49 (low) Vitamin A 12.1 (low) Vitamin C 0.6 (WNL) Zinc 34 (low) Copper 39 (low)   NUTRITION - FOCUSED PHYSICAL EXAM: Flowsheet Row Most Recent Value  Orbital Region No depletion  Upper Arm Region No depletion  Thoracic and Lumbar Region No depletion  Buccal Region No depletion  Temple Region No depletion  Clavicle Bone Region No depletion  Clavicle and Acromion Bone Region Mild depletion  Scapular Bone Region No depletion  Dorsal Hand No depletion  Patellar Region Unable to assess  Anterior Thigh Region Unable to assess  Posterior Calf Region Unable to assess  Edema (RD Assessment) Moderate  Hair Reviewed  Eyes Reviewed  Mouth Reviewed  Skin Reviewed  Nails Reviewed    Diet Order:   Diet Order             Diet NPO time specified  Diet effective ____                   EDUCATION NEEDS:  Not appropriate for education at this time  Skin:  Skin Integrity Issues: Stage 2: - right elbow ( 2.5 cm x 3 cm) Surgical incisions: - bilateral legs, wound vac x 3  Last BM:  8/30 - type 7  Height:  Ht Readings from Last 1 Encounters:  10/08/22 5\' 7"  (1.702 m)   Weight:  Wt Readings from Last 1 Encounters:  10/14/22 106.7 kg   Ideal Body Weight:  61.4 kg  BMI:  Body mass index is 36.84 kg/m.  Estimated Nutritional Needs:  Kcal:  2200-2500 kcal/d Protein:  115-130g/d Fluid:  >/= 2.3 L    Greig Castilla, RD, LDN Clinical Dietitian RD pager # available in AMION  After hours/weekend pager # available in [facility]

## 2022-10-17 NOTE — Anesthesia Procedure Notes (Signed)
Procedure Name: Intubation Date/Time: 10/17/2022 9:21 AM  Performed by: Alease Medina, CRNAPre-anesthesia Checklist: Patient identified, Emergency Drugs available, Suction available and Patient being monitored Patient Re-evaluated:Patient Re-evaluated prior to induction Oxygen Delivery Method: Circle system utilized Preoxygenation: Pre-oxygenation with 100% oxygen Induction Type: IV induction, Rapid sequence and Cricoid Pressure applied Laryngoscope Size: Mac and 3 Grade View: Grade I Tube type: Oral Tube size: 7.5 mm Number of attempts: 1 Airway Equipment and Method: Stylet and Oral airway Placement Confirmation: ETT inserted through vocal cords under direct vision, positive ETCO2 and breath sounds checked- equal and bilateral Secured at: 21 cm Tube secured with: Tape Dental Injury: Teeth and Oropharynx as per pre-operative assessment

## 2022-10-17 NOTE — Transfer of Care (Signed)
Immediate Anesthesia Transfer of Care Note  Patient: Kelli Hudson  Procedure(s) Performed: BILATERAL THIGH AND LEG DEBRIDEMENT, PARTIAL BILATERAL FOOT AMPUTATION (Bilateral: Leg Lower)  Patient Location: PACU  Anesthesia Type:General  Level of Consciousness: drowsy and patient cooperative  Airway & Oxygen Therapy: Patient Spontanous Breathing  Post-op Assessment: Report given to RN, Post -op Vital signs reviewed and stable, and Patient moving all extremities X 4  Post vital signs: Reviewed and stable  Last Vitals:  Vitals Value Taken Time  BP 136/89   Temp    Pulse 101 10/17/22 1055  Resp 12 10/17/22 1055  SpO2 98 % 10/17/22 1055  Vitals shown include unfiled device data.  Last Pain:  Vitals:   10/17/22 0600  TempSrc:   PainSc: 0-No pain      Patients Stated Pain Goal: 0 (10/17/22 0600)  Complications: No notable events documented.

## 2022-10-17 NOTE — Progress Notes (Signed)
eLink Physician-Brief Progress Note Patient Name: Kelli Hudson DOB: 10-Apr-1990 MRN: [id number]   Date of Service  10/17/2022  HPI/Events of Note  Recent necrotizing fasciitis with bilateral lower extremity wounds.  Anticipate return to the OR in the morning for I&D and potential amputations in the morning.  Notified by critical's-hemoglobin 6.3, WBC 51.7  eICU Interventions  1 unit PRBC ordered with new type and screen.  Coag studies ordered for this morning.        Jovanni Eckhart 10/17/2022, 5:57 AM

## 2022-10-17 NOTE — Progress Notes (Signed)
NAME:  Kelli Hudson, MRN:  [id number], DOB:  1990/12/30, LOS: 14 ADMISSION DATE:  10/03/2022, CONSULTATION DATE:  10/08/22 REFERRING MD:  Lajoyce Corners, CHIEF COMPLAINT: nec fasc   History of Present Illness:   [name] PMH Dm1 diabetic gastroparesis now s/p gastric pacemaker, HTN admitted 8/17 with leg swelling -- BLE foot wounds, which  were being followed outpt by podiatry. Admitted to [facility] for sepsis in this setting, OR with podiatry 8/18 for I&D BLE foot wounds.   ID was consulted 8/20 for ongoing fever. Concern for nec fasc 8/21 ortho consulted -- transferred to [facility], underwent excision and debridement of BLE --extending feet to thighs. EBL from this case is not recorded but pt did receive 4 PRBC 2 FFP as well as crystalloid + colloid, as well as pressors for hemodynamic support .  Remains intubated after case. PCCM consulted for ICU management.   Pertinent  Medical History  DM1 Diabetic gastroparesis S/p gastric pacemaker  HTN  Significant Hospital Events: Including procedures, antibiotic start and stop dates in addition to other pertinent events   8/17 admit to [facility] sepsis BLE wound infections. Podiatry consult 8/18 I&D with podiatry.  8/20 ID consult  8/21 ortho consult. OR for excision and debridement of BLE nec fasc-- feet to thighs. Remained intubated, PCCM consulted.  4 PRBC 2 FFP during case. 8/23-extubated 8/26 Back to OR for wound vac change.   Interim History / Subjective:  She was afebrile overnight White count continue to trend up [facility] OR for debridement of bilateral lower extremities, wound VAC in place Hemoglobin dropped down to 6.5, received 2 units of PRBCs  Objective   Blood pressure 124/74, pulse (!) 105, temperature (!) 97.2 F (36.2 C), resp. rate 11, height 5\' 7"  (1.702 m), weight 106.7 kg, SpO2 96%.        Intake/Output Summary (Last 24 hours) at 10/17/2022 1249 Last data filed at 10/17/2022 1140 Gross per 24 hour  Intake 1932.67 ml  Output 2245 ml   Net -312.33 ml   Filed Weights   10/11/22 0400 10/12/22 0500 10/14/22 0444  Weight: 103.1 kg 102.7 kg 106.7 kg    Examination: General: Acute on chronically ill-appearing young female, lying on the bed HEENT: [place]/AT, eyes anicteric.  moist mucus membranes Neuro: Alert, awake following commands Chest: Coarse breath sounds, no wheezes or rhonchi Heart: Regular rate and rhythm, no murmurs or gallops Abdomen: Soft, nontender, nondistended, bowel sounds present Skin: Bilateral lower extremity unstageable wounds, s/p debridement and wound VAC in place   Lab/imaging reviewed WBC 51.7, hemoglobin 6.3, platelets 382 Sodium 135, BUN/creatinine 71/2.25  Resolved Hospital Problem list     Assessment & Plan:  Severe sepsis due to bilateral feet abscess, necrotizing fasciitis, UA S/p bilateral excisional leg and thigh debridement Continue cefepime, linezolid, Flagyl per ID recommendation White count is up again to 51K Dilaudid PRN, oxy for pain control Patient went back to the OR for debridement of bilateral leg wounds with wound VAC in place Appreciate orthopedic recommendations Closely monitor Started on IV fluid  Acute blood loss anemia due to operative blood loss Hemoglobin Down to 6.5 Received 2 units of PRBCs in OR Will receive 1 more unit today Monitor H&H and transfuse if less than 7   Type 1 diabetes, poorly controlled with hyperglycemia and complicated with gastropathy Continue Levemir SSI to moderate scale, added tube feed coverage with CBG goal 140-180  Acute kidney injury due to septic ATN Acute metabolic acidosis Serum creatinine continue to trend up  Started on gentle IV fluid hydration, monitor intake and output Avoid nephrotoxic agents  Severe protein calorie malnutrition Continue tube feeds.   Gastroparesis Status post gastric pacemaker placement Continue Reglan Attempted clears 8/28 but she had a bout of vomiting.  Keep n.p.o. for  now  Hypertension Hyperlipidemia Hold antihypertensive medications  Depression, suicidal ideation Denies suicidal ideation Psychiatry signed off  Remains critically ill  Best Practice (right click and "Reselect all SmartList Selections" daily)   Diet/type: tubefeeds DVT prophylaxis: prophylactic heparin  GI prophylaxis: PPI Lines: Central line and Arterial Line Foley:  Yes, and it is still needed Code Status:  full code Last date of multidisciplinary goals of care discussion [pending]   The patient is critically ill due to severe sepsis/AKI/bilateral feet abscess/necrotizing fasciitis.  Critical care was necessary to treat or prevent imminent or life-threatening deterioration.  Critical care was time spent personally by me on the following activities: development of treatment plan with patient and/or surrogate as well as nursing, discussions with consultants, evaluation of patient's response to treatment, examination of patient, obtaining history from patient or surrogate, ordering and performing treatments and interventions, ordering and review of laboratory studies, ordering and review of radiographic studies, pulse oximetry, re-evaluation of patient's condition and participation in multidisciplinary rounds.   During this encounter critical care time was devoted to patient care services described in this note for 44 minutes.     Cheri Fowler, MD [place] [facility] See Amion for pager If no response to pager, please call [phone] until 7pm After 7pm, Please call E-link [phone]

## 2022-10-17 NOTE — Interval H&P Note (Signed)
History and Physical Interval Note:  10/17/2022 6:37 AM  [name]  has presented today for surgery, with the diagnosis of Necrotizing Fasciitis.  The various methods of treatment have been discussed with the patient and family. After consideration of risks, benefits and other options for treatment, the patient has consented to  Procedure(s): BILATERAL THIGH AND LEG DEBRIDEMENT, POSSIBLE PARTIAL BILATERAL FOOT AMPUTATION (Bilateral) as a surgical intervention.  The patient's history has been reviewed, patient examined, no change in status, stable for surgery.  I have reviewed the patient's chart and labs.  Questions were answered to the patient's satisfaction.     Kelli Hudson

## 2022-10-18 ENCOUNTER — Encounter (HOSPITAL_COMMUNITY): Payer: Self-pay | Admitting: Orthopedic Surgery

## 2022-10-18 DIAGNOSIS — M726 Necrotizing fasciitis: Secondary | ICD-10-CM | POA: Diagnosis not present

## 2022-10-18 DIAGNOSIS — N179 Acute kidney failure, unspecified: Secondary | ICD-10-CM | POA: Diagnosis not present

## 2022-10-18 DIAGNOSIS — L03115 Cellulitis of right lower limb: Secondary | ICD-10-CM | POA: Diagnosis not present

## 2022-10-18 DIAGNOSIS — D72829 Elevated white blood cell count, unspecified: Secondary | ICD-10-CM | POA: Diagnosis not present

## 2022-10-18 DIAGNOSIS — E1043 Type 1 diabetes mellitus with diabetic autonomic (poly)neuropathy: Secondary | ICD-10-CM | POA: Diagnosis not present

## 2022-10-18 LAB — BASIC METABOLIC PANEL
Anion gap: 5 (ref 5–15)
BUN: 78 mg/dL — ABNORMAL HIGH (ref 6–20)
CO2: 20 mmol/L — ABNORMAL LOW (ref 22–32)
Calcium: 8 mg/dL — ABNORMAL LOW (ref 8.9–10.3)
Chloride: 109 mmol/L (ref 98–111)
Creatinine, Ser: 2.38 mg/dL — ABNORMAL HIGH (ref 0.44–1.00)
GFR, Estimated: 27 mL/min — ABNORMAL LOW (ref >60)
Glucose, Bld: 117 mg/dL — ABNORMAL HIGH (ref 70–99)
Potassium: 4.4 mmol/L (ref 3.5–5.1)
Sodium: 134 mmol/L — ABNORMAL LOW (ref 135–145)

## 2022-10-18 LAB — CBC WITH DIFFERENTIAL/PLATELET
Abs Immature Granulocytes: 0.5 10*3/uL — ABNORMAL HIGH (ref 0.00–0.07)
Basophils Absolute: 0 10*3/uL (ref 0.0–0.1)
Basophils Relative: 0 %
Blasts: 1 %
Eosinophils Absolute: 0.5 10*3/uL (ref 0.0–0.5)
Eosinophils Relative: 1 %
HCT: 24.2 % — ABNORMAL LOW (ref 36.0–46.0)
Hemoglobin: 8.1 g/dL — ABNORMAL LOW (ref 12.0–15.0)
Lymphocytes Relative: 2 %
Lymphs Abs: 1 10*3/uL (ref 0.7–4.0)
MCH: 29 pg (ref 26.0–34.0)
MCHC: 33.5 g/dL (ref 30.0–36.0)
MCV: 86.7 fL (ref 80.0–100.0)
Monocytes Absolute: 1 10*3/uL (ref 0.1–1.0)
Monocytes Relative: 2 %
Myelocytes: 1 %
Neutro Abs: 46 10*3/uL — ABNORMAL HIGH (ref 1.7–7.7)
Neutrophils Relative %: 93 %
Platelets: 354 10*3/uL (ref 150–400)
RBC: 2.79 MIL/uL — ABNORMAL LOW (ref 3.87–5.11)
RDW: 17.1 % — ABNORMAL HIGH (ref 11.5–15.5)
WBC: 49.5 10*3/uL — ABNORMAL HIGH (ref 4.0–10.5)
nRBC: 0 /100 WBC
nRBC: 0.2 % (ref 0.0–0.2)

## 2022-10-18 LAB — CBC
HCT: 25.8 % — ABNORMAL LOW (ref 36.0–46.0)
Hemoglobin: 8.6 g/dL — ABNORMAL LOW (ref 12.0–15.0)
MCH: 29.2 pg (ref 26.0–34.0)
MCHC: 33.3 g/dL (ref 30.0–36.0)
MCV: 87.5 fL (ref 80.0–100.0)
Platelets: 395 10*3/uL (ref 150–400)
RBC: 2.95 MIL/uL — ABNORMAL LOW (ref 3.87–5.11)
RDW: 17.1 % — ABNORMAL HIGH (ref 11.5–15.5)
WBC: 53.9 10*3/uL (ref 4.0–10.5)
nRBC: 0.2 % (ref 0.0–0.2)

## 2022-10-18 LAB — GLUCOSE, CAPILLARY
Glucose-Capillary: 99 mg/dL (ref 70–99)
Glucose-Capillary: 131 mg/dL — ABNORMAL HIGH (ref 70–99)
Glucose-Capillary: 106 mg/dL — ABNORMAL HIGH (ref 70–99)
Glucose-Capillary: 204 mg/dL — ABNORMAL HIGH (ref 70–99)
Glucose-Capillary: 189 mg/dL — ABNORMAL HIGH (ref 70–99)
Glucose-Capillary: 143 mg/dL — ABNORMAL HIGH (ref 70–99)

## 2022-10-18 LAB — MAGNESIUM: Magnesium: 2 mg/dL (ref 1.7–2.4)

## 2022-10-18 LAB — PREPARE RBC (CROSSMATCH)

## 2022-10-18 MED ORDER — SODIUM CHLORIDE 0.9 % IV SOLN
2.0000 g | Freq: Two times a day (BID) | INTRAVENOUS | Status: DC
  Administered 2022-10-18 – 2022-10-19 (×3): 2 g via INTRAVENOUS
  Filled 2022-10-18 (×3): qty 12.5

## 2022-10-18 MED ORDER — SODIUM CHLORIDE 0.9% IV SOLUTION: Freq: Once | INTRAVENOUS | Status: DC

## 2022-10-18 MED ORDER — MIDODRINE HCL 5 MG PO TABS
10.0000 mg | ORAL_TABLET | Freq: Three times a day (TID) | ORAL | Status: DC
  Administered 2022-10-18 – 2022-10-21 (×8): 10 mg via ORAL
  Filled 2022-10-18 (×8): qty 2

## 2022-10-18 NOTE — Progress Notes (Signed)
[facility]    Date of Admission:  10/03/2022   Total days of antibiotics 15          ID: Kelli Hudson is a 32 y.o. female with  polymicrobial necrotizing fascitis and leukocytosis, requiring icu support for hemodynamic instabilit Principal Problem:   Necrotizing fasciitis (HCC) Active Problems:   Anemia   Sepsis (HCC)   AKI (acute kidney injury) (HCC)   Diabetic gastroparesis associated with type 1 diabetes mellitus (HCC)   DM type 1 (diabetes mellitus, type 1) (HCC)   Drug-seeking behavior   Sepsis due to cellulitis (HCC)   Cellulitis of lower extremity   Necrotizing fasciitis of pelvic region and thigh (HCC)   Necrotizing fasciitis of lower leg (HCC)   MDD (major depressive disorder), recurrent episode, moderate (HCC)    Subjective: Had bilateral thigh I x D yesterday and wound vac placement.-there is good granulation tissue. She also received albumin, 2 u rbc and 1 u FFP. Was extubated and returned to the unit. Required brief support with vasopressors. Is being fed by tube feeds. Had larger watery stool yesterday x 1, tested negative for cdifficile. No further bowel movements this morning  Wbc 54 down to 50 but still 93% N. Remains afebrile.   She reports legs still hurt but less than a few days ago  Medications:   sodium chloride   Intravenous Once   sodium chloride   Intravenous Once   acetaminophen  1,000 mg Oral Q6H   vitamin C  500 mg Oral Daily   Chlorhexidine Gluconate Cloth  6 each Topical Daily   copper  4 mg Oral Daily   DULoxetine  30 mg Oral Daily   feeding supplement (PROSource TF20)  60 mL Per Tube Daily   fiber supplement (BANATROL TF)  60 mL Per Tube BID   folic acid  1 mg Oral Daily   gabapentin  300 mg Oral TID   heparin injection (subcutaneous)  5,000 Units Subcutaneous Q8H   insulin aspart  0-15 Units Subcutaneous Q4H   insulin detemir  5 Units Subcutaneous BID   linezolid  600 mg Oral Q12H   methocarbamol   1,000 mg Oral TID   metoCLOPramide  10 mg Per Tube Q8H   metroNIDAZOLE  500 mg Oral Q12H   midodrine  10 mg Oral TID WC   multivitamin with minerals  1 tablet Oral Daily   nutrition supplement (JUVEN)  1 packet Per Tube BID BM   mouth rinse  15 mL Mouth Rinse 4 times per day   pantoprazole (PROTONIX) IV  40 mg Intravenous Q12H   polyethylene glycol  17 g Per Tube Daily   sodium chloride flush  10-40 mL Intracatheter Q12H   vitamin A  10,000 Units Oral Daily   zinc sulfate  220 mg Oral Daily    Objective: Vital signs in last 24 hours: Temp:  [97.2 F (36.2 C)-98.1 F (36.7 C)] 97.2 F (36.2 C) (08/31 1300) Pulse Rate:  [88-108] 101 (08/31 1300) Resp:  [5-22] 10 (08/31 1300) BP: (86-115)/(45-69) 107/54 (08/31 1300) SpO2:  [90 %-100 %] 96 % (08/31 1300) Arterial Line BP: (81-142)/(40-82) 124/71 (08/31 1300) Weight:  [98.7 kg] 98.7 kg (08/31 0500) Physical Exam  Constitutional:  oriented to person, place, and time. appears well-developed and well-nourished. No distress.  HENT: [place]/AT, PERRLA, no scleral icterus Mouth/Throat: Oropharynx is clear and moist. No oropharyngeal exudate.  Cardiovascular: Normal rate, regular rhythm and normal heart sounds. Exam reveals  no gallop and no friction rub.  No murmur heard.  Pulmonary/Chest: Effort normal and breath sounds normal. No respiratory distress.  has no wheezes.  Neck = supple, no nuchal rigidity Abdominal: Soft. Bowel sounds are normal.  exhibits no distension. There is no tenderness.  BMW:UXLKG edema to arms and lower extremity bilaterally wrapped Psychiatric: a normal mood and affect.  behavior is normal.    Lab Results Recent Labs    10/17/22 0444 10/17/22 1933 10/18/22 0323  WBC 51.7* 54.3* 49.5*  HGB 6.3* 8.8* 8.1*  HCT 19.7* 26.0* 24.2*  NA 135  --  134*  K 4.9  --  4.4  CL 113*  --  109  CO2 18*  --  20*  BUN 71*  --  78*  CREATININE 2.25*  --  2.38*    Microbiology:          Staphylococcus aureus Pseudomonas  aeruginosa      MIC MIC    CEFEPIME   2 SENSITIVE Sensitive    CEFTAZIDIME   4 SENSITIVE Sensitive    CIPROFLOXACIN <=0.5 SENSI... Sensitive <=0.25 SENS... Sensitive    CLINDAMYCIN >=8 RESISTANT Resistant      ERYTHROMYCIN >=8 RESISTANT Resistant      GENTAMICIN <=0.5 SENSI... Sensitive <=1 SENSITIVE Sensitive    IMIPENEM   1 SENSITIVE Sensitive    Inducible Clindamycin NEGATIVE Sensitive      LINEZOLID 2 SENSITIVE Sensitive      OXACILLIN 0.5 SENSITIVE Sensitive      PIP/TAZO   16 SENSITIVE Sensitive    RIFAMPIN <=0.5 SENSI... Sensitive      TETRACYCLINE >=16 RESIST... Resistant      TRIMETH/SULFA <=10 SENSIT... Sensitive      VANCOMYCIN <=0.5 SENSI... Sensitive     Also rare diptheroids  Susceptibility   Staphylococcus epidermidis    MIC    CIPROFLOXACIN <=0.5 SENSI... Sensitive    CLINDAMYCIN <=0.25 SENS... Sensitive    ERYTHROMYCIN >=8 RESISTANT Resistant    GENTAMICIN <=0.5 SENSI... Sensitive    Inducible Clindamycin NEGATIVE Sensitive    OXACILLIN >=4 RESISTANT Resistant    RIFAMPIN <=0.5 SENSI... Sensitive    TETRACYCLINE >=16 RESIST... Resistant    TRIMETH/SULFA 160 RESISTANT Resistant    VANCOMYCIN 1 SENSITIVE Sensitive        Studies/Results: No results found.   Assessment/Plan: Necrotizing fasciitis, polymicrobial including MSSA, MRSE, PsA +/- diptheroids = currently on linezolid, cefepime, and metronidazole.   Drug monitoring = Has been on linezolid for almost 2 weeks. Platelets still up. Appears to tolerate without thrombocytopenia.  Leukocytosis = no new sources of infection found as of yet, and is on significant broad spectrum abtx, this could still be within realm of leukemoid reaction. Continue to monitor  Aki = could be due to recent fluid shifts. Suspect pre-renal.will dose adjust abtx.  I have personally spent 50 minutes involved in face-to-face and non-face-to-face activities for this patient on the day of the visit. Professional time spent  includes the following activities: Preparing to see the patient (review of tests), Obtaining and/or reviewing separately obtained history (admission/discharge record), Performing a medically appropriate examination and/or evaluation , Ordering medications/tests/procedures, referring and communicating with other health care professionals, Documenting clinical information in the EMR, Independently interpreting results (not separately reported), Communicating results to the patient/family/caregiver, Counseling and educating the patient/family/caregiver and Care coordination (not separately reported).     [facility] Pager: [phone]  10/18/2022, 2:06 PM

## 2022-10-18 NOTE — Progress Notes (Signed)
NAME:  Kelli Hudson, MRN:  [id number], DOB:  04-17-1990, LOS: 15 ADMISSION DATE:  10/03/2022, CONSULTATION DATE:  10/08/22 REFERRING MD:  Lajoyce Corners, CHIEF COMPLAINT: nec fasc   History of Present Illness:   [name] PMH Dm1 diabetic gastroparesis now s/p gastric pacemaker, HTN admitted 8/17 with leg swelling -- BLE foot wounds, which  were being followed outpt by podiatry. Admitted to [facility] for sepsis in this setting, OR with podiatry 8/18 for I&D BLE foot wounds.   ID was consulted 8/20 for ongoing fever. Concern for nec fasc 8/21 ortho consulted -- transferred to [facility], underwent excision and debridement of BLE --extending feet to thighs. EBL from this case is not recorded but pt did receive 4 PRBC 2 FFP as well as crystalloid + colloid, as well as pressors for hemodynamic support .  Remains intubated after case. PCCM consulted for ICU management.   Pertinent  Medical History  DM1 Diabetic gastroparesis S/p gastric pacemaker  HTN  Significant Hospital Events: Including procedures, antibiotic start and stop dates in addition to other pertinent events   8/17 admit to [facility] sepsis BLE wound infections. Podiatry consult 8/18 I&D with podiatry.  8/20 ID consult  8/21 ortho consult. OR for excision and debridement of BLE nec fasc-- feet to thighs. Remained intubated, PCCM consulted.  4 PRBC 2 FFP during case. 8/23-extubated 8/26 Back to OR for wound vac change.   Interim History / Subjective:  She was afebrile overnight White count trended down from 54-40 9K Wound VAC showed minimal bloody output Required vasopressor support with Levophed overnight Hemoglobin is around 8  Objective   Blood pressure (!) 108/55, pulse 96, temperature (!) 97.5 F (36.4 C), resp. rate 12, height 5\' 7"  (1.702 m), weight 98.7 kg, SpO2 98%.        Intake/Output Summary (Last 24 hours) at 10/18/2022 0745 Last data filed at 10/18/2022 0700 Gross per 24 hour  Intake 4257.94 ml  Output 1195 ml  Net 3062.94 ml    Filed Weights   10/12/22 0500 10/14/22 0444 10/18/22 0500  Weight: 102.7 kg 106.7 kg 98.7 kg    Examination: General: Acute on chronically ill-appearing female, lying on the bed HEENT: [place]/AT, eyes anicteric.  moist mucus membranes Neuro: Alert, awake following commands Chest: Coarse breath sounds, no wheezes or rhonchi Heart: Regular rate and rhythm, no murmurs or gallops Abdomen: Soft, nontender, nondistended, bowel sounds present Skin: Bilateral lower extremity unstageable wounds status post debridement and wound VAC placed bilaterally  Lab/imaging reviewed WBC 49.5, hemoglobin 8.1, platelets 354 Sodium 134, BUN/creatinine 78/2.38, Co2 20 UO 1095, improved   Resolved Hospital Problem list     Assessment & Plan:  Severe sepsis due to bilateral feet abscess, necrotizing fasciitis, UA S/p bilateral excisional leg and thigh debridement Continue cefepime, linezolid, Flagyl per ID recommendation, cefepime dose was adjusted White count trended down to 49K Continue Dilaudid and and oxycodone for pain control Required vasopressor support briefly overnight at 2 mics, currently off Started on midodrine 10 mg 3 times daily Orthopedic following  Acute blood loss anemia due to operative blood loss 2 units PRBCs yesterday, hemoglobin is 8.1 Monitor H&H and transfuse if less than 7   Type 1 diabetes, poorly controlled with hyperglycemia and complicated with gastropathy Blood sugars are controlled now Continue Levemir SSI to moderate scale, added tube feed coverage with CBG goal 140-180  Acute kidney injury due to septic ATN Acute metabolic acidosis Serum creatinine continue to trend up, currently at 2.3 Started on gentle  IV fluid hydration, her and output has improved from yesterday Avoid nephrotoxic agents  Severe protein calorie malnutrition Continue tube feeds.  Will have speech and swallow evaluation so she can have at least clear liquid diet  Gastroparesis Status post  gastric pacemaker placement Continue Reglan  Hypertension Hyperlipidemia Hold antihypertensive medications as her blood pressure is borderline  Depression, suicidal ideation Denies suicidal ideation Psychiatry signed off   Best Practice (right click and "Reselect all SmartList Selections" daily)   Diet/type: tubefeeds DVT prophylaxis: prophylactic heparin  GI prophylaxis: PPI Lines: Central line and Arterial Line Foley:  Yes, and it is still needed Code Status:  full code Last date of multidisciplinary goals of care discussion [8/31: Patient was updated at bedside, continue full scope of care]   The patient is critically ill due to severe sepsis/AKI/bilateral feet abscess/necrotizing fasciitis.  Critical care was necessary to treat or prevent imminent or life-threatening deterioration.  Critical care was time spent personally by me on the following activities: development of treatment plan with patient and/or surrogate as well as nursing, discussions with consultants, evaluation of patient's response to treatment, examination of patient, obtaining history from patient or surrogate, ordering and performing treatments and interventions, ordering and review of laboratory studies, ordering and review of radiographic studies, pulse oximetry, re-evaluation of patient's condition and participation in multidisciplinary rounds.   During this encounter critical care time was devoted to patient care services described in this note for 37 minutes   Cheri Fowler, MD [facility] See Amion for pager If no response to pager, please call [phone] until 7pm After 7pm, Please call E-link [phone]

## 2022-10-18 NOTE — Progress Notes (Signed)
Changed bilateral foot dressings per order and significant bleeding noted from bilateral feet once dressing removed, L>R. She had wound bed bleeding from Right foot - pressure held and pressure dressing applied to right foot, moderate drainage noted on new dressing. Left foot had pulsatile blood flow squirting from sole of foot. Dressing reapplied and reapplied kerlix and pressure dressing applied with ace wrap. Unable to get in contact with ortho. Dr. Merrily Pew called and present at bedside, plan to notify ortho and await their recommendation, transfuse 1 unit PRBC now.   Kelli Hudson 3:30 PM 10/18/22

## 2022-10-18 NOTE — Plan of Care (Signed)
  Problem: Education: Goal: Knowledge of General Education information will improve Description: Including pain rating scale, medication(s)/side effects and non-pharmacologic comfort measures Outcome: Progressing   Problem: Health Behavior/Discharge Planning: Goal: Ability to manage health-related needs will improve Outcome: Progressing   Problem: Clinical Measurements: Goal: Ability to maintain clinical measurements within normal limits will improve Outcome: Progressing Goal: Will remain free from infection Outcome: Progressing Goal: Diagnostic test results will improve Outcome: Progressing Goal: Respiratory complications will improve Outcome: Progressing Goal: Cardiovascular complication will be avoided Outcome: Progressing   Problem: Activity: Goal: Risk for activity intolerance will decrease Outcome: Progressing   Problem: Nutrition: Goal: Adequate nutrition will be maintained Outcome: Progressing   Problem: Coping: Goal: Level of anxiety will decrease Outcome: Progressing   Problem: Elimination: Goal: Will not experience complications related to bowel motility Outcome: Progressing Goal: Will not experience complications related to urinary retention Outcome: Progressing   Problem: Pain Managment: Goal: General experience of comfort will improve Outcome: Progressing   Problem: Safety: Goal: Ability to remain free from injury will improve Outcome: Progressing   Problem: Skin Integrity: Goal: Risk for impaired skin integrity will decrease Outcome: Progressing   Problem: Education: Goal: Ability to describe self-care measures that may prevent or decrease complications (Diabetes Survival Skills Education) will improve Outcome: Progressing   Problem: Coping: Goal: Ability to adjust to condition or change in health will improve Outcome: Progressing   Problem: Fluid Volume: Goal: Ability to maintain a balanced intake and output will improve Outcome:  Progressing   Problem: Health Behavior/Discharge Planning: Goal: Ability to identify and utilize available resources and services will improve Outcome: Progressing Goal: Ability to manage health-related needs will improve Outcome: Progressing   Problem: Metabolic: Goal: Ability to maintain appropriate glucose levels will improve Outcome: Progressing   Problem: Nutritional: Goal: Maintenance of adequate nutrition will improve Outcome: Progressing Goal: Progress toward achieving an optimal weight will improve Outcome: Progressing   Problem: Skin Integrity: Goal: Risk for impaired skin integrity will decrease Outcome: Progressing   Problem: Tissue Perfusion: Goal: Adequacy of tissue perfusion will improve Outcome: Progressing   Problem: Education: Goal: Knowledge of the prescribed therapeutic regimen will improve Outcome: Progressing Goal: Ability to verbalize activity precautions or restrictions will improve Outcome: Progressing Goal: Understanding of discharge needs will improve Outcome: Progressing   Problem: Activity: Goal: Ability to perform//tolerate increased activity and mobilize with assistive devices will improve Outcome: Progressing   Problem: Clinical Measurements: Goal: Postoperative complications will be avoided or minimized Outcome: Progressing   Problem: Self-Care: Goal: Ability to meet self-care needs will improve Outcome: Progressing   Problem: Self-Concept: Goal: Ability to maintain and perform role responsibilities to the fullest extent possible will improve Outcome: Progressing   Problem: Pain Management: Goal: Pain level will decrease with appropriate interventions Outcome: Progressing   Problem: Skin Integrity: Goal: Demonstration of wound healing without infection will improve Outcome: Progressing   Problem: Activity: Goal: Ability to tolerate increased activity will improve Outcome: Progressing   Problem: Respiratory: Goal: Ability  to maintain a clear airway and adequate ventilation will improve Outcome: Progressing   Problem: Role Relationship: Goal: Method of communication will improve Outcome: Progressing

## 2022-10-18 NOTE — Progress Notes (Signed)
Received call from Dr. Merrily Pew to evaluate bleeding surgical wound on feet.  Wounds exhibited venous bleeding.  No pulsatile bleeding was encountered at this time which likely had clotted off.  We reapplied new dry pressure bandages to both feet.  She tolerated this well.  Recommend holding next 2 doses of subcutaneous heparin.  May resume daily dressing changes tomorrow at 4 pm.    Mayra Reel, MD [facility] 3:57 PM

## 2022-10-18 NOTE — Plan of Care (Signed)
  Problem: Education: Goal: Knowledge of General Education information will improve Description: Including pain rating scale, medication(s)/side effects and non-pharmacologic comfort measures Outcome: Progressing   Problem: Clinical Measurements: Goal: Ability to maintain clinical measurements within normal limits will improve Outcome: Progressing Goal: Diagnostic test results will improve Outcome: Progressing Goal: Cardiovascular complication will be avoided Outcome: Progressing   Problem: Nutrition: Goal: Adequate nutrition will be maintained Outcome: Progressing   Problem: Elimination: Goal: Will not experience complications related to bowel motility Outcome: Progressing Goal: Will not experience complications related to urinary retention Outcome: Progressing   Problem: Pain Managment: Goal: General experience of comfort will improve Outcome: Progressing   Problem: Safety: Goal: Ability to remain free from injury will improve Outcome: Progressing   Problem: Coping: Goal: Ability to adjust to condition or change in health will improve Outcome: Progressing

## 2022-10-18 NOTE — Progress Notes (Signed)
Patient ID: Kelli Hudson, female   DOB: 01/25/1991, 32 y.o.   MRN: [id number] Patient is postoperative day 1 repeat debridement both feet and debridement both legs with wound closure of the legs.  The wound vacs have a good suction fit there is no drainage from the right leg.  Left leg has about 50 cc of drainage.  Her legs are soft.  Please resume the Vashe dressing changes to both feet change daily.  Patient has a absolute neutrophil to lymphocyte count of 46.  Stool for C. difficile is negative.

## 2022-10-19 DIAGNOSIS — N179 Acute kidney failure, unspecified: Secondary | ICD-10-CM | POA: Diagnosis not present

## 2022-10-19 DIAGNOSIS — L0390 Cellulitis, unspecified: Secondary | ICD-10-CM | POA: Diagnosis not present

## 2022-10-19 DIAGNOSIS — M726 Necrotizing fasciitis: Secondary | ICD-10-CM | POA: Diagnosis not present

## 2022-10-19 DIAGNOSIS — E1043 Type 1 diabetes mellitus with diabetic autonomic (poly)neuropathy: Secondary | ICD-10-CM | POA: Diagnosis not present

## 2022-10-19 DIAGNOSIS — D72829 Elevated white blood cell count, unspecified: Secondary | ICD-10-CM | POA: Diagnosis not present

## 2022-10-19 LAB — CBC WITH DIFFERENTIAL/PLATELET
Abs Immature Granulocytes: 2.03 10*3/uL — ABNORMAL HIGH (ref 0.00–0.07)
Basophils Absolute: 0.2 10*3/uL — ABNORMAL HIGH (ref 0.0–0.1)
Basophils Relative: 0 %
Eosinophils Absolute: 0.1 10*3/uL (ref 0.0–0.5)
Eosinophils Relative: 0 %
HCT: 23.5 % — ABNORMAL LOW (ref 36.0–46.0)
Hemoglobin: 8 g/dL — ABNORMAL LOW (ref 12.0–15.0)
Immature Granulocytes: 4 %
Lymphocytes Relative: 6 %
Lymphs Abs: 3.1 10*3/uL (ref 0.7–4.0)
MCH: 29.9 pg (ref 26.0–34.0)
MCHC: 34 g/dL (ref 30.0–36.0)
MCV: 87.7 fL (ref 80.0–100.0)
Monocytes Absolute: 4.2 10*3/uL — ABNORMAL HIGH (ref 0.1–1.0)
Monocytes Relative: 8 %
Neutro Abs: 44.7 10*3/uL — ABNORMAL HIGH (ref 1.7–7.7)
Neutrophils Relative %: 82 %
Platelets: 387 10*3/uL (ref 150–400)
RBC: 2.68 MIL/uL — ABNORMAL LOW (ref 3.87–5.11)
RDW: 17.2 % — ABNORMAL HIGH (ref 11.5–15.5)
WBC: 54.3 10*3/uL (ref 4.0–10.5)
nRBC: 0.3 % — ABNORMAL HIGH (ref 0.0–0.2)

## 2022-10-19 LAB — TYPE AND SCREEN
ABO/RH(D): O POS
Antibody Screen: NEGATIVE
Unit division: 0
Unit division: 0
Unit division: 0
Unit division: 0

## 2022-10-19 LAB — BPAM RBC
Blood Product Expiration Date: 202409062359
Blood Product Expiration Date: 202409222359
Blood Product Expiration Date: 202409222359
Blood Product Expiration Date: 202409222359
ISSUE DATE / TIME: 202408300750
ISSUE DATE / TIME: 202408300840
ISSUE DATE / TIME: 202408301322
ISSUE DATE / TIME: 202408311515
Unit Type and Rh: 5100
Unit Type and Rh: 5100
Unit Type and Rh: 5100
Unit Type and Rh: 5100

## 2022-10-19 LAB — BASIC METABOLIC PANEL
Anion gap: 7 (ref 5–15)
BUN: 82 mg/dL — ABNORMAL HIGH (ref 6–20)
CO2: 19 mmol/L — ABNORMAL LOW (ref 22–32)
Calcium: 8 mg/dL — ABNORMAL LOW (ref 8.9–10.3)
Chloride: 108 mmol/L (ref 98–111)
Creatinine, Ser: 2.42 mg/dL — ABNORMAL HIGH (ref 0.44–1.00)
GFR, Estimated: 27 mL/min — ABNORMAL LOW (ref >60)
Glucose, Bld: 169 mg/dL — ABNORMAL HIGH (ref 70–99)
Potassium: 4.1 mmol/L (ref 3.5–5.1)
Sodium: 134 mmol/L — ABNORMAL LOW (ref 135–145)

## 2022-10-19 LAB — GLUCOSE, CAPILLARY
Glucose-Capillary: 156 mg/dL — ABNORMAL HIGH (ref 70–99)
Glucose-Capillary: 135 mg/dL — ABNORMAL HIGH (ref 70–99)
Glucose-Capillary: 150 mg/dL — ABNORMAL HIGH (ref 70–99)
Glucose-Capillary: 108 mg/dL — ABNORMAL HIGH (ref 70–99)
Glucose-Capillary: 127 mg/dL — ABNORMAL HIGH (ref 70–99)
Glucose-Capillary: 120 mg/dL — ABNORMAL HIGH (ref 70–99)

## 2022-10-19 LAB — PREPARE FRESH FROZEN PLASMA: Unit division: 0

## 2022-10-19 LAB — BPAM FFP
Blood Product Expiration Date: 202409022359
ISSUE DATE / TIME: 202408300840
Unit Type and Rh: 7300

## 2022-10-19 LAB — MAGNESIUM: Magnesium: 2.1 mg/dL (ref 1.7–2.4)

## 2022-10-19 LAB — PHOSPHORUS: Phosphorus: 3.6 mg/dL (ref 2.5–4.6)

## 2022-10-19 MED ORDER — LINEZOLID 600 MG PO TABS
600.0000 mg | ORAL_TABLET | Freq: Two times a day (BID) | ORAL | Status: DC
  Administered 2022-10-19 – 2022-10-20 (×3): 600 mg via ORAL
  Filled 2022-10-19 (×4): qty 1

## 2022-10-19 MED ORDER — NOREPINEPHRINE 4 MG/250ML-% IV SOLN: 0.0000 ug/min | INTRAVENOUS | Status: DC

## 2022-10-19 MED ORDER — ALBUMIN HUMAN 5 % IV SOLN
25.0000 g | Freq: Once | INTRAVENOUS | Status: AC
  Administered 2022-10-19: 25 g via INTRAVENOUS
  Filled 2022-10-19: qty 500

## 2022-10-19 MED ORDER — SODIUM CHLORIDE 0.9 % IV SOLN
1.0000 g | Freq: Two times a day (BID) | INTRAVENOUS | Status: DC
  Administered 2022-10-19 – 2022-10-23 (×9): 1 g via INTRAVENOUS
  Filled 2022-10-19 (×9): qty 20

## 2022-10-19 MED ORDER — HEPARIN SODIUM (PORCINE) 5000 UNIT/ML IJ SOLN
5000.0000 [IU] | Freq: Three times a day (TID) | INTRAMUSCULAR | Status: DC
  Administered 2022-10-19 – 2022-10-21 (×6): 5000 [IU] via SUBCUTANEOUS
  Filled 2022-10-19 (×6): qty 1

## 2022-10-19 MED ORDER — LACTATED RINGERS IV BOLUS: 500.0000 mL | Freq: Once | INTRAVENOUS | Status: DC

## 2022-10-19 NOTE — Progress Notes (Signed)
NAME:  Kelli Hudson, MRN:  [id number], DOB:  15-Sep-1990, LOS: 16 ADMISSION DATE:  10/03/2022, CONSULTATION DATE:  10/08/22 REFERRING MD:  Lajoyce Corners, CHIEF COMPLAINT: nec fasc   History of Present Illness:  [name] PMH Dm1 diabetic gastroparesis now s/p gastric pacemaker, HTN admitted 8/17 with leg swelling -- BLE foot wounds, which  were being followed outpt by podiatry. Admitted to [facility] for sepsis in this setting, OR with podiatry 8/18 for I&D BLE foot wounds.   ID was consulted 8/20 for ongoing fever. Concern for nec fasc 8/21 ortho consulted -- transferred to [facility], underwent excision and debridement of BLE --extending feet to thighs. EBL from this case is not recorded but pt did receive 4 PRBC 2 FFP as well as crystalloid + colloid, as well as pressors for hemodynamic support .  Remains intubated after case. PCCM consulted for ICU management.   Pertinent  Medical History  DM1 Diabetic gastroparesis S/p gastric pacemaker  HTN  Significant Hospital Events: Including procedures, antibiotic start and stop dates in addition to other pertinent events   8/17 admit to [facility] sepsis BLE wound infections. Podiatry consult 8/18 I&D with podiatry.  8/20 ID consult  8/21 ortho consult. OR for excision and debridement of BLE nec fasc-- feet to thighs. Remained intubated, PCCM consulted.  4 PRBC 2 FFP during case. 8/23-extubated 8/26 Back to OR for wound vac change.   Interim History / Subjective:  She was afebrile overnight White count continued to rise now back again to 54K Started bleeding from dorsum of left foot yesterday after dressing change, seen by orthopedic, recommend pressure dressing, received 1 unit PRBC Wound VAC showed minimal bloody output Remain off vasopressor support Hemoglobin is around 8 Had an episode of vomiting, tube feeds kept on hold  Objective   Blood pressure 114/61, pulse 96, temperature 98.1 F (36.7 C), resp. rate (!) 8, height 5\' 7"  (1.702 m), weight 98.4 kg,  SpO2 100%.        Intake/Output Summary (Last 24 hours) at 10/19/2022 0853 Last data filed at 10/19/2022 3474 Gross per 24 hour  Intake 3247.78 ml  Output 1080 ml  Net 2167.78 ml   Filed Weights   10/14/22 0444 10/18/22 0500 10/19/22 0500  Weight: 106.7 kg 98.7 kg 98.4 kg    Examination: General: Acute on chronically ill-appearing female, lying on the bed HEENT: [place]/AT, eyes anicteric.  moist mucus membranes Neuro: Alert, awake following commands Chest: Coarse breath sounds, no wheezes or rhonchi Heart: Regular rate and rhythm, no murmurs or gallops Abdomen: Soft, nontender, nondistended, bowel sounds present Skin: Bilateral extensive wounds noted on lower extremities status post bilateral wound VAC and multiple debridement, no active bleeding noted, POA  Lab/imaging reviewed WBC 54.3, hemoglobin 8, platelets 387 Sodium 134, BUN/creatinine 82/2.42, Co2 19  Resolved Hospital Problem list   Depression, suicidal ideation Denies suicidal ideation Psychiatry signed off  Assessment & Plan:  Severe sepsis due to bilateral feet abscess, necrotizing fasciitis, POA S/p bilateral excisional leg and thigh debridement Continue cefepime, linezolid, Flagyl per ID recommendations White count started trending up, now its 54.3 Continue Dilaudid and and oxycodone for pain control Did not require vasopressor support overnight, blood pressure is stable Started on midodrine 10 mg 3 times daily Orthopedic following  Acute blood loss anemia due to operative blood loss Started bleeding from dorsum of left foot during dressing change Seen by orthopedic recommend pressure dressing, received 1 more unit of PRBCs yesterday Monitor H&H and transfuse if less than 7  Type 1 diabetes, poorly controlled with hyperglycemia and complicated with gastropathy Blood sugars are controlled now Continue Levemir SSI to moderate scale, added tube feed coverage with CBG goal 140-180  Acute kidney injury due to  septic ATN Acute metabolic acidosis Hyponatremia Serum creatinine continue to trend up, currently at 2.4 Will give 2 bottles of albumin Serum bicarbonate is at 19, closely monitor Avoid nephrotoxic agents Closely monitor electrolytes  Severe protein calorie malnutrition Restart tube feeds at lower rate, she vomits at night Cleared by speech and swallow, continue clear liquid  Gastroparesis Status post gastric pacemaker placement Continue Reglan scheduled every 8 hours  Hypertension Hyperlipidemia Hold antihypertensive medications as her blood pressure is borderline   Best Practice (right click and "Reselect all SmartList Selections" daily)   Diet/type: tubefeeds DVT prophylaxis: prophylactic heparin  GI prophylaxis: PPI Lines: Central line and Arterial Line Foley:  Yes, and it is still needed Code Status:  full code Last date of multidisciplinary goals of care discussion [8/31: Patient was updated at bedside, continue full scope of care]   The patient is critically ill due to severe sepsis/AKI/bilateral feet abscess/necrotizing fasciitis.  Critical care was necessary to treat or prevent imminent or life-threatening deterioration.  Critical care was time spent personally by me on the following activities: development of treatment plan with patient and/or surrogate as well as nursing, discussions with consultants, evaluation of patient's response to treatment, examination of patient, obtaining history from patient or surrogate, ordering and performing treatments and interventions, ordering and review of laboratory studies, ordering and review of radiographic studies, pulse oximetry, re-evaluation of patient's condition and participation in multidisciplinary rounds.   During this encounter critical care time was devoted to patient care services described in this note for 36 minutes   Cheri Fowler, MD [place] See Amion for pager If no response to pager,  please call [phone] until 7pm After 7pm, Please call E-link [phone]

## 2022-10-19 NOTE — Progress Notes (Signed)
[facility]    Date of Admission:  10/03/2022   Total days of antibiotics 12   ID: Kelli Hudson is a 32 y.o. female with   Principal Problem:   Necrotizing fasciitis (HCC) Active Problems:   Anemia   Sepsis (HCC)   AKI (acute kidney injury) (HCC)   Diabetic gastroparesis associated with type 1 diabetes mellitus (HCC)   DM type 1 (diabetes mellitus, type 1) (HCC)   Drug-seeking behavior   Sepsis due to cellulitis (HCC)   Cellulitis of lower extremity   Necrotizing fasciitis of pelvic region and thigh (HCC)   Necrotizing fasciitis of lower leg (HCC)   MDD (major depressive disorder), recurrent episode, moderate (HCC)    Subjective: Afebrile. Needed some vasopressor support overnight,but weaned off this morning. No fevers; 2 small bm this morning  WBC continues to trend up to 54K with increased left shift  Medications:   sodium chloride   Intravenous Once   sodium chloride   Intravenous Once   sodium chloride   Intravenous Once   acetaminophen  1,000 mg Oral Q6H   vitamin C  500 mg Oral Daily   Chlorhexidine Gluconate Cloth  6 each Topical Daily   copper  4 mg Oral Daily   DULoxetine  30 mg Oral Daily   feeding supplement (PROSource TF20)  60 mL Per Tube Daily   fiber supplement (BANATROL TF)  60 mL Per Tube BID   folic acid  1 mg Oral Daily   gabapentin  300 mg Oral TID   heparin injection (subcutaneous)  5,000 Units Subcutaneous Q8H   insulin aspart  0-15 Units Subcutaneous Q4H   insulin detemir  5 Units Subcutaneous BID   linezolid  600 mg Oral Q12H   methocarbamol  1,000 mg Oral TID   metoCLOPramide  10 mg Per Tube Q8H   midodrine  10 mg Oral TID WC   multivitamin with minerals  1 tablet Oral Daily   nutrition supplement (JUVEN)  1 packet Per Tube BID BM   mouth rinse  15 mL Mouth Rinse 4 times per day   pantoprazole (PROTONIX) IV  40 mg Intravenous Q12H   sodium chloride flush  10-40 mL Intracatheter Q12H   vitamin A   10,000 Units Oral Daily   zinc sulfate  220 mg Oral Daily    Objective: Vital signs in last 24 hours: Temp:  [97.2 F (36.2 C)-98.1 F (36.7 C)] 97.3 F (36.3 C) (09/01 1100) Pulse Rate:  [87-105] 105 (09/01 1100) Resp:  [0-19] 14 (09/01 1100) BP: (91-141)/(41-77) 141/67 (09/01 1100) SpO2:  [96 %-100 %] 98 % (09/01 1100) Arterial Line BP: (66-124)/(44-71) 83/69 (09/01 0500) Weight:  [98.4 kg] 98.4 kg (09/01 0500)  Physical Exam  Constitutional:  oriented to person, place, and time. appears well-developed and well-nourished. No distress.  HENT: [place]/AT, PERRLA, no scleral icterus. Ng in place Mouth/Throat: Oropharynx is clear and moist. No oropharyngeal exudate.  Cardiovascular: Normal rate, regular rhythm and normal heart sounds. Exam reveals no gallop and no friction rub.  No murmur heard.  Pulmonary/Chest: Effort normal and breath sounds normal. No respiratory distress.  has no wheezes.  Neck = supple, no nuchal rigidity Abdominal: Soft. Bowel sounds are normal.  exhibits no distension. There is no tenderness.  ZOX:WRUEAVW/UJWJX vac to legs Lymphadenopathy: no cervical adenopathy. No axillary adenopathy Neurological: alert and oriented to person, place, and time.  Skin: Skin is warm and dry. No rash noted. No erythema.  Psychiatric:  a normal mood and affect.  behavior is normal.    Lab Results Recent Labs    10/18/22 0323 10/18/22 1759 10/19/22 0349  WBC 49.5* 53.9* 54.3*  HGB 8.1* 8.6* 8.0*  HCT 24.2* 25.8* 23.5*  NA 134*  --  134*  K 4.4  --  4.1  CL 109  --  108  CO2 20*  --  19*  BUN 78*  --  82*  CREATININE 2.38*  --  2.42*     Microbiology: reviewed Studies/Results: No results found.   Assessment/Plan: Polymicrobial necrotizing fasciitis = would anticipate decrease in her leukocytosis by now. Will do a few days trial of meropenem to see if expanded coverage will do any difference. Based on culture results, she had sensitive PsA. Continue with linezolid  since she had MRSE  Aki = still elevated, receiving albumin and also had unit of rbc to help with intravascular volume.  Leukocytosis = up to 54k, still plan to continue to monitor with change in abtx  [facility] Pager: [phone]  10/19/2022, 11:27 AM

## 2022-10-19 NOTE — Progress Notes (Addendum)
Pt had small vomiting episode x1 with visible tube feed in emesis. Tube feeds held, zofran administered w/ good response. E-link team notified, no new orders. Will continue to monitor.

## 2022-10-19 NOTE — Progress Notes (Signed)
Patient ID: Kelli Hudson, female   DOB: 1991/01/13, 32 y.o.   MRN: [id number]   Came by to check on bilateral foot wounds this am.  It was noted that the left foot wound had pulsatile bleeding yesterday, which apparently had stopped after applying direct pressure and apply new pressure bandage.  Two doses of subcu heparin have been held as instructed by Dr. Roda Shutters.  Foot wound has been stable since.  Right food wound stable.  No fluid in right foot vac canister.  150 ml blood in left foot.  Continue daily dressing changes starting today at 4 pm.

## 2022-10-19 NOTE — Progress Notes (Signed)
Spoke with ID, as WBC going up, will change cefepime and flagyl to Meropenem   Cheri Fowler, MD

## 2022-10-19 NOTE — Progress Notes (Addendum)
Pharmacy Antibiotic Note  Kelli Hudson is a 32 y.o. female admitted on 10/03/2022 with sepsis and cellulitis.  Pharmacy has been consulted for initiation of meropenem. Renal function has been unstable, currently being dosed for estimated CrCl of 40.6 w/ Scr 2.42 mg/dL. Presumed source control 8/30 however has not demonstrated adequate improvement.  8/21 bilateral debridement 8/26 wound vac change 8/30 repeat debridement  Plan: Initiate meropenem 1g q12h  Continue linezolid 600 q12h Discontinue cefepime, metronidazole  Height: 5\' 7"  (170.2 cm) Weight: 98.4 kg (216 lb 14.9 oz) IBW/kg (Calculated) : 61.6  Temp (24hrs), Avg:97.5 F (36.4 C), Min:97.2 F (36.2 C), Max:98.1 F (36.7 C)  Recent Labs  Lab 10/15/22 0524 10/16/22 0254 10/17/22 0444 10/17/22 1933 10/18/22 0323 10/18/22 1759 10/19/22 0349  WBC 41.2* 47.7* 51.7* 54.3* 49.5* 53.9* 54.3*  CREATININE 1.90* 1.69* 2.25*  --  2.38*  --  2.42*    Estimated Creatinine Clearance: 40.6 mL/min (A) (by C-G formula based on SCr of 2.42 mg/dL (H)).    Allergies  Allergen Reactions   Ibuprofen Other (See Comments)    07/23/22 - Worsening kidney function after 3 doses of ibuprofen 400mg .  Creatinine increased from 1.3 to 2.  Creatinine returned to normal after stopping.   Lisinopril Cough, Other (See Comments), Itching and Rash    Breakout in rash    Antimicrobials this admission: Outpt Doxy 8/8 x 10 days Cefepime 8/16 >9/1 Vancomycin 8/16 > 8/21 Flagyl 8/16>>9/1 Zyvox 8/20 >> 8/24; 8/25 > Meropenem 9/1 >  Microbiology results: 8/16 Bcx: NGF 8/16 resp panel: negative 8/18 R Foot Wound: ngtd 8/21 R calf tissue: staph aureus, MRSE 8/21 R foot tissue: MSSA, PsA (pan-susceptible) 8/29 Bcx: NG 8/30 Cdif: negative  Thank you for allowing pharmacy to be a part of this patient's care.  Rutherford Nail, PharmD PGY2 Critical Care Pharmacy Resident 10/19/2022 10:59 AM

## 2022-10-19 NOTE — Plan of Care (Signed)
  Problem: Education: Goal: Knowledge of General Education information will improve Description: Including pain rating scale, medication(s)/side effects and non-pharmacologic comfort measures Outcome: Progressing   Problem: Health Behavior/Discharge Planning: Goal: Ability to manage health-related needs will improve Outcome: Progressing   Problem: Clinical Measurements: Goal: Ability to maintain clinical measurements within normal limits will improve Outcome: Progressing Goal: Will remain free from infection Outcome: Progressing Goal: Diagnostic test results will improve Outcome: Progressing Goal: Respiratory complications will improve Outcome: Progressing Goal: Cardiovascular complication will be avoided Outcome: Progressing   Problem: Activity: Goal: Risk for activity intolerance will decrease Outcome: Progressing   Problem: Nutrition: Goal: Adequate nutrition will be maintained Outcome: Progressing   Problem: Coping: Goal: Level of anxiety will decrease Outcome: Progressing   Problem: Elimination: Goal: Will not experience complications related to bowel motility Outcome: Progressing Goal: Will not experience complications related to urinary retention Outcome: Progressing   Problem: Pain Managment: Goal: General experience of comfort will improve Outcome: Progressing   Problem: Safety: Goal: Ability to remain free from injury will improve Outcome: Progressing   Problem: Skin Integrity: Goal: Risk for impaired skin integrity will decrease Outcome: Progressing   Problem: Education: Goal: Ability to describe self-care measures that may prevent or decrease complications (Diabetes Survival Skills Education) will improve Outcome: Progressing   Problem: Coping: Goal: Ability to adjust to condition or change in health will improve Outcome: Progressing   Problem: Fluid Volume: Goal: Ability to maintain a balanced intake and output will improve Outcome:  Progressing   Problem: Health Behavior/Discharge Planning: Goal: Ability to identify and utilize available resources and services will improve Outcome: Progressing Goal: Ability to manage health-related needs will improve Outcome: Progressing   Problem: Metabolic: Goal: Ability to maintain appropriate glucose levels will improve Outcome: Progressing   Problem: Nutritional: Goal: Maintenance of adequate nutrition will improve Outcome: Progressing Goal: Progress toward achieving an optimal weight will improve Outcome: Progressing   Problem: Skin Integrity: Goal: Risk for impaired skin integrity will decrease Outcome: Progressing   Problem: Tissue Perfusion: Goal: Adequacy of tissue perfusion will improve Outcome: Progressing   Problem: Education: Goal: Knowledge of the prescribed therapeutic regimen will improve Outcome: Progressing Goal: Ability to verbalize activity precautions or restrictions will improve Outcome: Progressing Goal: Understanding of discharge needs will improve Outcome: Progressing   Problem: Activity: Goal: Ability to perform//tolerate increased activity and mobilize with assistive devices will improve Outcome: Progressing   Problem: Clinical Measurements: Goal: Postoperative complications will be avoided or minimized Outcome: Progressing   Problem: Self-Care: Goal: Ability to meet self-care needs will improve Outcome: Progressing   Problem: Self-Concept: Goal: Ability to maintain and perform role responsibilities to the fullest extent possible will improve Outcome: Progressing   Problem: Pain Management: Goal: Pain level will decrease with appropriate interventions Outcome: Progressing   Problem: Skin Integrity: Goal: Demonstration of wound healing without infection will improve Outcome: Progressing   Problem: Activity: Goal: Ability to tolerate increased activity will improve Outcome: Progressing   Problem: Respiratory: Goal: Ability  to maintain a clear airway and adequate ventilation will improve Outcome: Progressing   Problem: Role Relationship: Goal: Method of communication will improve Outcome: Progressing

## 2022-10-20 DIAGNOSIS — M726 Necrotizing fasciitis: Secondary | ICD-10-CM | POA: Diagnosis not present

## 2022-10-20 LAB — CBC WITH DIFFERENTIAL/PLATELET
Abs Immature Granulocytes: 1.86 10*3/uL — ABNORMAL HIGH (ref 0.00–0.07)
Basophils Absolute: 0.2 10*3/uL — ABNORMAL HIGH (ref 0.0–0.1)
Basophils Relative: 0 %
Eosinophils Absolute: 0.2 10*3/uL (ref 0.0–0.5)
Eosinophils Relative: 0 %
HCT: 21.9 % — ABNORMAL LOW (ref 36.0–46.0)
Hemoglobin: 7.3 g/dL — ABNORMAL LOW (ref 12.0–15.0)
Immature Granulocytes: 4 %
Lymphocytes Relative: 6 %
Lymphs Abs: 3.1 10*3/uL (ref 0.7–4.0)
MCH: 30 pg (ref 26.0–34.0)
MCHC: 33.3 g/dL (ref 30.0–36.0)
MCV: 90.1 fL (ref 80.0–100.0)
Monocytes Absolute: 4.1 10*3/uL — ABNORMAL HIGH (ref 0.1–1.0)
Monocytes Relative: 8 %
Neutro Abs: 43.9 10*3/uL — ABNORMAL HIGH (ref 1.7–7.7)
Neutrophils Relative %: 82 %
Platelets: 474 10*3/uL — ABNORMAL HIGH (ref 150–400)
RBC: 2.43 MIL/uL — ABNORMAL LOW (ref 3.87–5.11)
RDW: 17.6 % — ABNORMAL HIGH (ref 11.5–15.5)
WBC: 53.3 10*3/uL (ref 4.0–10.5)
nRBC: 0.2 % (ref 0.0–0.2)

## 2022-10-20 LAB — GLUCOSE, CAPILLARY
Glucose-Capillary: 106 mg/dL — ABNORMAL HIGH (ref 70–99)
Glucose-Capillary: 106 mg/dL — ABNORMAL HIGH (ref 70–99)
Glucose-Capillary: 143 mg/dL — ABNORMAL HIGH (ref 70–99)
Glucose-Capillary: 187 mg/dL — ABNORMAL HIGH (ref 70–99)
Glucose-Capillary: 150 mg/dL — ABNORMAL HIGH (ref 70–99)
Glucose-Capillary: 130 mg/dL — ABNORMAL HIGH (ref 70–99)

## 2022-10-20 LAB — BASIC METABOLIC PANEL
Anion gap: 5 (ref 5–15)
BUN: 86 mg/dL — ABNORMAL HIGH (ref 6–20)
CO2: 18 mmol/L — ABNORMAL LOW (ref 22–32)
Calcium: 8 mg/dL — ABNORMAL LOW (ref 8.9–10.3)
Chloride: 110 mmol/L (ref 98–111)
Creatinine, Ser: 2.54 mg/dL — ABNORMAL HIGH (ref 0.44–1.00)
GFR, Estimated: 25 mL/min — ABNORMAL LOW (ref >60)
Glucose, Bld: 125 mg/dL — ABNORMAL HIGH (ref 70–99)
Potassium: 4.1 mmol/L (ref 3.5–5.1)
Sodium: 133 mmol/L — ABNORMAL LOW (ref 135–145)

## 2022-10-20 LAB — MAGNESIUM: Magnesium: 2 mg/dL (ref 1.7–2.4)

## 2022-10-20 MED ORDER — SILVER NITRATE-POT NITRATE 75-25 % EX MISC
1.0000 | Freq: Once | CUTANEOUS | Status: DC
  Filled 2022-10-20: qty 1

## 2022-10-20 MED ORDER — HYDROMORPHONE HCL 1 MG/ML IJ SOLN
0.5000 mg | INTRAMUSCULAR | Status: DC | PRN
  Administered 2022-10-20 – 2022-10-29 (×49): 0.5 mg via INTRAVENOUS
  Filled 2022-10-20 (×52): qty 0.5

## 2022-10-20 NOTE — Progress Notes (Signed)
Patient ID: Kelli Hudson, female   DOB: November 20, 1990, 32 y.o.   MRN: [id number] Examination of both thighs and calfs shows soft supple compartments.  The induration has resolved.  The wound vacs have a good suction fit.  Examination of both feet show progressive necrosis of the soft tissue with a necrotic blister over the dorsum of the left ankle.  Right foot shows necrotic ulceration back to the hindfoot.  Patient's white cell count is still elevated greater than 50,000.  With the persistent elevated white cell count and progressive necrosis of both feet.  I have recommended proceeding with a below-knee amputation bilaterally.  I discussed this with the patient and with her mother on the phone.  Plan for bilateral transtibial amputations on Wednesday.

## 2022-10-20 NOTE — Plan of Care (Signed)
  Problem: Education: Goal: Knowledge of General Education information will improve Description: Including pain rating scale, medication(s)/side effects and non-pharmacologic comfort measures Outcome: Progressing   Problem: Clinical Measurements: Goal: Ability to maintain clinical measurements within normal limits will improve Outcome: Progressing Goal: Cardiovascular complication will be avoided Outcome: Progressing   Problem: Nutrition: Goal: Adequate nutrition will be maintained Outcome: Progressing   Problem: Coping: Goal: Level of anxiety will decrease Outcome: Progressing   Problem: Elimination: Goal: Will not experience complications related to bowel motility Outcome: Progressing Goal: Will not experience complications related to urinary retention Outcome: Progressing

## 2022-10-20 NOTE — Plan of Care (Signed)
  Problem: Pain Managment: Goal: General experience of comfort will improve Outcome: Progressing   

## 2022-10-20 NOTE — Progress Notes (Signed)
NAME:  Kelli Hudson, MRN:  [id number], DOB:  April 03, 1990, LOS: 17 ADMISSION DATE:  10/03/2022, CONSULTATION DATE:  10/08/22 REFERRING MD:  Lajoyce Corners, CHIEF COMPLAINT: nec fasc   History of Present Illness:  [name] PMH Dm1 diabetic gastroparesis now s/p gastric pacemaker, HTN admitted 8/17 with leg swelling -- BLE foot wounds, which  were being followed outpt by podiatry. Admitted to [facility] for sepsis in this setting, OR with podiatry 8/18 for I&D BLE foot wounds.   ID was consulted 8/20 for ongoing fever. Concern for nec fasc 8/21 ortho consulted -- transferred to [facility], underwent excision and debridement of BLE --extending feet to thighs. EBL from this case is not recorded but pt did receive 4 PRBC 2 FFP as well as crystalloid + colloid, as well as pressors for hemodynamic support .  Remains intubated after case. PCCM consulted for ICU management.   Pertinent  Medical History  DM1 Diabetic gastroparesis S/p gastric pacemaker  HTN  Significant Hospital Events: Including procedures, antibiotic start and stop dates in addition to other pertinent events   8/17 admit to [facility] sepsis BLE wound infections. Podiatry consult 8/18 I&D with podiatry.  8/20 ID consult  8/21 ortho consult. OR for excision and debridement of BLE nec fasc-- feet to thighs. Remained intubated, PCCM consulted.  4 PRBC 2 FFP during case. 8/23-extubated 8/26 Back to OR for wound vac change.  9/1 cefepime/Flagyl changed to meropenem  Interim History / Subjective:  Remains off of pressors Minimal wound VAC output WBC 53 S Cr 2.42 > 2.54 I?O + 11.9L total   Objective   Blood pressure (!) 125/57, pulse (!) 112, temperature 98.1 F (36.7 C), resp. rate 10, height 5\' 7"  (1.702 m), weight 99.3 kg, SpO2 100%.        Intake/Output Summary (Last 24 hours) at 10/20/2022 0953 Last data filed at 10/20/2022 0900 Gross per 24 hour  Intake 2044.59 ml  Output 1730 ml  Net 314.59 ml   Filed Weights   10/18/22 0500 10/19/22 0500  10/20/22 0500  Weight: 98.7 kg 98.4 kg 99.3 kg    Examination: General: Ill-appearing woman laying in bed.  She is comfortable HEENT: Oropharynx moist, strong voice, pupils equal.  NG tube in place Neuro: Somewhat lethargic, does wake and answer questions but a bit slow to respond (just received narcotic).  Moves upper extremities Chest: Decreased both bases, otherwise clear no wheezing or crackles Heart: Distant, regular, no murmur Abdomen: Nondistended with positive bowel sounds Skin: Bilateral extensive surgical wounds with vacs in place.  Dressings on both plantar surfaces feet   Resolved Hospital Problem list   Depression, suicidal ideation Denies suicidal ideation Psychiatry signed off  Assessment & Plan:  Severe sepsis due to bilateral feet abscess, necrotizing fasciitis, POA S/p bilateral excisional leg and thigh debridement -Appreciate ID support and input.  Now on linezolid, meropenem -Follow WBC, wound VAC output -Adjust pain control for dressing changes, currently Dilaudid, oxycodone -Remains on midodrine 3 times daily -Appreciate orthopedics management  Acute blood loss anemia due to operative blood loss -Pressure dressing bilateral plantar surfaces in place -Following CBC -Transfusion goal hemoglobin 7.0  Type 1 diabetes, poorly controlled with hyperglycemia and complicated with gastropathy -Levemir as ordered -Sliding scale insulin as per protocol, CBG goal 1 40-1 80  Acute kidney injury due to septic ATN Non-anion gap metabolic acidosis Hyponatremia, euvolemic -Continue to follow urine output, BMP -Hold off on bicarbonate for now, consider going forward -Replete electrolytes as indicated  Severe protein calorie  malnutrition -Continue low rate TF -Push enteral diet as she can tolerate.  Has had emesis (severe gastroparesis)  Gastroparesis -Gastric pacemaker in place -Reglan as ordered  Hypertension Hyperlipidemia -Antihypertension regimen currently  on hold   Best Practice (right click and "Reselect all SmartList Selections" daily)   Diet/type: tubefeeds DVT prophylaxis: prophylactic heparin  GI prophylaxis: PPI Lines: Central line and Arterial Line Foley:  Yes, and it is still needed Code Status:  full code Last date of multidisciplinary goals of care discussion [8/31: Patient was updated at bedside, continue full scope of care]  Independent critical care time: 32 minutes  Levy Pupa, MD, PhD 10/20/2022, 9:54 AM [facility] [phone] or if no answer before 7:00PM call [phone] For any issues after 7:00PM please call eLink [phone]

## 2022-10-21 DIAGNOSIS — D72829 Elevated white blood cell count, unspecified: Secondary | ICD-10-CM | POA: Diagnosis not present

## 2022-10-21 DIAGNOSIS — M726 Necrotizing fasciitis: Secondary | ICD-10-CM | POA: Diagnosis not present

## 2022-10-21 LAB — CBC
HCT: 21.2 % — ABNORMAL LOW (ref 36.0–46.0)
Hemoglobin: 7 g/dL — ABNORMAL LOW (ref 12.0–15.0)
MCH: 29.8 pg (ref 26.0–34.0)
MCHC: 33 g/dL (ref 30.0–36.0)
MCV: 90.2 fL (ref 80.0–100.0)
Platelets: 551 10*3/uL — ABNORMAL HIGH (ref 150–400)
RBC: 2.35 MIL/uL — ABNORMAL LOW (ref 3.87–5.11)
RDW: 18.1 % — ABNORMAL HIGH (ref 11.5–15.5)
WBC: 49.6 10*3/uL — ABNORMAL HIGH (ref 4.0–10.5)
nRBC: 0.3 % — ABNORMAL HIGH (ref 0.0–0.2)

## 2022-10-21 LAB — BASIC METABOLIC PANEL
Anion gap: 11 (ref 5–15)
BUN: 89 mg/dL — ABNORMAL HIGH (ref 6–20)
CO2: 18 mmol/L — ABNORMAL LOW (ref 22–32)
Calcium: 8.3 mg/dL — ABNORMAL LOW (ref 8.9–10.3)
Chloride: 110 mmol/L (ref 98–111)
Creatinine, Ser: 2.38 mg/dL — ABNORMAL HIGH (ref 0.44–1.00)
GFR, Estimated: 27 mL/min — ABNORMAL LOW (ref >60)
Glucose, Bld: 190 mg/dL — ABNORMAL HIGH (ref 70–99)
Potassium: 4.6 mmol/L (ref 3.5–5.1)
Sodium: 139 mmol/L (ref 135–145)

## 2022-10-21 LAB — CULTURE, BLOOD (ROUTINE X 2)
Culture: NO GROWTH
Culture: NO GROWTH
Special Requests: ADEQUATE

## 2022-10-21 LAB — GLUCOSE, CAPILLARY
Glucose-Capillary: 148 mg/dL — ABNORMAL HIGH (ref 70–99)
Glucose-Capillary: 176 mg/dL — ABNORMAL HIGH (ref 70–99)
Glucose-Capillary: 180 mg/dL — ABNORMAL HIGH (ref 70–99)
Glucose-Capillary: 136 mg/dL — ABNORMAL HIGH (ref 70–99)
Glucose-Capillary: 65 mg/dL — ABNORMAL LOW (ref 70–99)
Glucose-Capillary: 128 mg/dL — ABNORMAL HIGH (ref 70–99)
Glucose-Capillary: 121 mg/dL — ABNORMAL HIGH (ref 70–99)

## 2022-10-21 LAB — PHOSPHORUS: Phosphorus: 3.6 mg/dL (ref 2.5–4.6)

## 2022-10-21 LAB — MAGNESIUM: Magnesium: 2.1 mg/dL (ref 1.7–2.4)

## 2022-10-21 LAB — PREPARE RBC (CROSSMATCH)

## 2022-10-21 LAB — HEMOGLOBIN AND HEMATOCRIT, BLOOD
HCT: 30.6 % — ABNORMAL LOW (ref 36.0–46.0)
Hemoglobin: 10.3 g/dL — ABNORMAL LOW (ref 12.0–15.0)

## 2022-10-21 LAB — PATHOLOGIST SMEAR REVIEW

## 2022-10-21 MED ORDER — DEXTROSE 50 % IV SOLN
25.0000 mL | Freq: Once | INTRAVENOUS | Status: AC
  Administered 2022-10-21: 25 mL via INTRAVENOUS

## 2022-10-21 MED ORDER — FOLIC ACID 1 MG PO TABS
1.0000 mg | ORAL_TABLET | Freq: Every day | ORAL | Status: DC
  Administered 2022-10-21 – 2022-10-26 (×5): 1 mg
  Filled 2022-10-21 (×5): qty 1

## 2022-10-21 MED ORDER — LINEZOLID 600 MG PO TABS
600.0000 mg | ORAL_TABLET | Freq: Two times a day (BID) | ORAL | Status: DC
  Administered 2022-10-21 – 2022-10-26 (×10): 600 mg
  Filled 2022-10-21 (×11): qty 1

## 2022-10-21 MED ORDER — ZINC SULFATE 220 (50 ZN) MG PO CAPS
220.0000 mg | ORAL_CAPSULE | Freq: Every day | ORAL | Status: AC
  Administered 2022-10-21: 220 mg

## 2022-10-21 MED ORDER — DEXTROSE 50 % IV SOLN
INTRAVENOUS | Status: AC
  Filled 2022-10-21: qty 50

## 2022-10-21 MED ORDER — DEXTROSE 10 % IV SOLN: INTRAVENOUS | Status: DC

## 2022-10-21 MED ORDER — MIDODRINE HCL 5 MG PO TABS
10.0000 mg | ORAL_TABLET | Freq: Three times a day (TID) | ORAL | Status: DC
  Administered 2022-10-22: 10 mg
  Filled 2022-10-21 (×2): qty 2

## 2022-10-21 MED ORDER — VITAMIN C 500 MG PO TABS
500.0000 mg | ORAL_TABLET | Freq: Every day | ORAL | Status: DC
  Administered 2022-10-21 – 2022-10-23 (×3): 500 mg
  Filled 2022-10-21 (×3): qty 1

## 2022-10-21 MED ORDER — COPPER 2 MG PO TABS
4.0000 mg | Freq: Every day | Status: AC
  Administered 2022-10-21: 4 mg
  Filled 2022-10-21: qty 2

## 2022-10-21 MED ORDER — SODIUM CHLORIDE 0.9% IV SOLUTION: Freq: Once | INTRAVENOUS | Status: DC

## 2022-10-21 MED ORDER — METHOCARBAMOL 500 MG PO TABS
1000.0000 mg | ORAL_TABLET | Freq: Three times a day (TID) | ORAL | Status: DC
  Administered 2022-10-21 – 2022-10-26 (×15): 1000 mg
  Filled 2022-10-21 (×15): qty 2

## 2022-10-21 MED ORDER — ADULT MULTIVITAMIN W/MINERALS CH
1.0000 | ORAL_TABLET | Freq: Every day | ORAL | Status: DC
  Administered 2022-10-21 – 2022-10-26 (×5): 1
  Filled 2022-10-21 (×5): qty 1

## 2022-10-21 MED ORDER — ACETAMINOPHEN 500 MG PO TABS
1000.0000 mg | ORAL_TABLET | Freq: Four times a day (QID) | ORAL | Status: DC
  Administered 2022-10-21 – 2022-10-26 (×16): 1000 mg
  Filled 2022-10-21 (×20): qty 2

## 2022-10-21 MED ORDER — TRANEXAMIC ACID 1000 MG/10ML IV SOLN
2000.0000 mg | INTRAVENOUS | Status: DC
  Filled 2022-10-21: qty 20

## 2022-10-21 MED ORDER — GABAPENTIN 250 MG/5ML PO SOLN
300.0000 mg | Freq: Three times a day (TID) | ORAL | Status: DC
  Administered 2022-10-21 – 2022-10-26 (×13): 300 mg
  Filled 2022-10-21 (×18): qty 6

## 2022-10-21 NOTE — Progress Notes (Signed)
NAME:  Kelli Hudson, MRN:  [id number], DOB:  05-01-1990, LOS: 18 ADMISSION DATE:  10/03/2022, CONSULTATION DATE:  10/08/22 REFERRING MD:  Lajoyce Corners, CHIEF COMPLAINT: nec fasc   History of Present Illness:  [name] PMH Dm1 diabetic gastroparesis now s/p gastric pacemaker, HTN admitted 8/17 with leg swelling -- BLE foot wounds, which  were being followed outpt by podiatry. Admitted to [facility] for sepsis in this setting, OR with podiatry 8/18 for I&D BLE foot wounds.   ID was consulted 8/20 for ongoing fever. Concern for nec fasc 8/21 ortho consulted -- transferred to [facility], underwent excision and debridement of BLE --extending feet to thighs. EBL from this case is not recorded but pt did receive 4 PRBC 2 FFP as well as crystalloid + colloid, as well as pressors for hemodynamic support .  Remains intubated after case. PCCM consulted for ICU management.   Pertinent  Medical History  DM1 Diabetic gastroparesis S/p gastric pacemaker  HTN  Significant Hospital Events: Including procedures, antibiotic start and stop dates in addition to other pertinent events   8/17 admit to [facility] sepsis BLE wound infections. Podiatry consult 8/18 I&D with podiatry.  8/20 ID consult  8/21 ortho consult. OR for excision and debridement of BLE nec fasc-- feet to thighs. Remained intubated, PCCM consulted.  4 PRBC 2 FFP during case. 8/23-extubated 8/26 Back to OR for wound vac change.  9/1 cefepime/Flagyl changed to meropenem  Interim History / Subjective:  Continued blistering and evidence of necrotic change on feet, WBC 50.  Dr. Lajoyce Corners has recommended bilateral BKA, tentatively planned for Wednesday S Cr 2.54 > 2.38 I/O+ 11.8 L total Off pressors, room air  Objective   Blood pressure 120/66, pulse 98, temperature (!) 97.2 F (36.2 C), resp. rate 14, height 5\' 7"  (1.702 m), weight 99.1 kg, SpO2 100%.        Intake/Output Summary (Last 24 hours) at 10/21/2022 0731 Last data filed at 10/21/2022 0700 Gross per 24  hour  Intake 1570.27 ml  Output 1750 ml  Net -179.73 ml   Filed Weights   10/19/22 0500 10/20/22 0500 10/21/22 0500  Weight: 98.4 kg 99.3 kg 99.1 kg    Examination: General: No distress, ill-appearing, very stoic and quiet HEENT: Oropharynx moist, strong voice, NG tube in place with TF running Neuro: Depressed affect, slow to answer but answers appropriately.  Moves extremities Chest: Decreased basilar breath sounds, clear, no crackles or wheezes Heart: Regular, distant, no murmur Abdomen: Nondistended with positive bowel sounds Skin: Dressings on plantar surfaces of both feet, wound vacs in place   Resolved Hospital Problem list   Depression, suicidal ideation Denies suicidal ideation Psychiatry signed off  Assessment & Plan:  Severe sepsis due to bilateral feet abscess, necrotizing fasciitis, POA.  MRSE, sensitive Pseudomonas S/p bilateral excisional leg and thigh debridement -Appreciate ID management and support.  Continue linezolid, meropenem -Planning for bilateral BKA with Dr. Lajoyce Corners on 9/4 -Following WBC, wound VAC output -Pain control for dressing changes, currently Dilaudid and oxycodone -Remains on midodrine 3 times daily  Acute blood loss anemia due to operative blood loss -Continue pressure dressing bilateral plantar surfaces in place -Following CBC -Transfusion goal Hgb 7.0  Type 1 diabetes, poorly controlled with hyperglycemia and complicated with gastropathy -Continue Levemir as ordered -Sliding-scale insulin as per protocol, CBG goal 140-180  Acute kidney injury due to septic ATN Non-anion gap metabolic acidosis Hyponatremia, euvolemic -Follow urine output, BMP -Continue bicarbonate infusion given nongap acidosis -Replete electrolytes as indicated  Severe  protein calorie malnutrition -Low rate TF -Push enteral diet as she can tolerate.  She has severe gastroparesis and associated emesis  Gastroparesis -Gastric pacemaker in place -Reglan as  ordered  Hypertension Hyperlipidemia -Continue to hold antihypertensive regimen.  She has been normotensive on current therapy  Depression, quite stoic and with appropriate situational depression -Continue Cymbalta -Will discuss with pharmacy possible options to add synergistic antidepressant   Best Practice (right click and "Reselect all SmartList Selections" daily)   Diet/type: tubefeeds DVT prophylaxis: prophylactic heparin  GI prophylaxis: PPI Lines: Central line and Arterial Line Foley:  Yes, and it is still needed Code Status:  full code Last date of multidisciplinary goals of care discussion [8/31: Patient was updated at bedside, continue full scope of care]  Independent critical care time: NA  Levy Pupa, MD, PhD 10/21/2022, 7:31 AM [facility] [phone] or if no answer before 7:00PM call [phone] For any issues after 7:00PM please call eLink [phone]

## 2022-10-21 NOTE — Progress Notes (Signed)
Patient ID: Kelli Hudson, female   DOB: November 01, 1990, 32 y.o.   MRN: [id number] Hemoglobin 7.0.  Orders placed for type and cross and transfuse with 2 units of packed red blood cells today.  Plan for bilateral transtibial amputations tomorrow.

## 2022-10-21 NOTE — Progress Notes (Signed)
E-link MD notified of AM Hgb 7.0, aside from serosanguinous drainage from L-foot wound, no frank sources of bleeding identified. No new orders at this time.

## 2022-10-21 NOTE — Progress Notes (Signed)
eLink Physician-Brief Progress Note Patient Name: Kelli Hudson DOB: 1990-08-08 MRN: [id number]   Date of Service  10/21/2022  HPI/Events of Note  Tube feeds were stopped earlier in the day due to vomiting, diabetic, blood cultures negative for 5 days  eICU Interventions  Low-dose dextrose infusion     Intervention Category Minor Interventions: Routine modifications to care plan (e.g. PRN medications for pain, fever)  Kelli Hudson 10/21/2022, 8:13 PM

## 2022-10-21 NOTE — H&P (View-Only) (Signed)
Patient ID: Kelli Hudson, female   DOB: November 01, 1990, 32 y.o.   MRN: [id number] Hemoglobin 7.0.  Orders placed for type and cross and transfuse with 2 units of packed red blood cells today.  Plan for bilateral transtibial amputations tomorrow.

## 2022-10-21 NOTE — Plan of Care (Signed)
  Problem: Education: Goal: Knowledge of General Education information will improve Description: Including pain rating scale, medication(s)/side effects and non-pharmacologic comfort measures Outcome: Progressing   Problem: Clinical Measurements: Goal: Cardiovascular complication will be avoided Outcome: Progressing   Problem: Nutrition: Goal: Adequate nutrition will be maintained Outcome: Progressing   Problem: Elimination: Goal: Will not experience complications related to urinary retention Outcome: Progressing   Problem: Pain Managment: Goal: General experience of comfort will improve Outcome: Progressing

## 2022-10-21 NOTE — Progress Notes (Signed)
[facility]  Date of Admission:  10/03/2022      Total days of antibiotics: 18             ASSESSMENT: Kelli Hudson is a 32 y.o. female admitted with   Necrotizing Fasciitis B/L LEs - (PsA, MRSA/MRSE, diptheroids) - Chronic B/L foot wounds -  -empiric broad spectrum coverage with persistent leukocytosis (49-50K) and necrotic wounds. Dr. Lajoyce Corners following closely and planning B/L BKA 9/4 -Should be able to pivot back to Cefepime + linezolid + metronidazole to cover the sensitive PsA and MRSA/MRSE on cultures.  -Will follow for OR findings of upper thigh fasciotomy sites for further duration recommendations.  -blood cultures negative    Leukocytosis -  -Slight improvement since switch to merrem but overall large source of infection to consider  -definitive plans for BKAs noted -no abdominal symptoms or fevers  -CDiff quick screen 8/30 negative   AKI -  Lab Results  Component Value Date   CREATININE 2.38 (H) 10/21/2022   CREATININE 2.54 (H) 10/20/2022   CREATININE 2.42 (H) 10/19/2022  - stable. PCCM team following with intravascular blood/fluid management    Medication Monitoring -  -ongoing thrombocytosis (reactive in setting of severe infection) - no toxic effect with linezolid  -anemia noted requiring blood transfusions. Today 7.0 - 2 pRBCs today per surgery    PLAN: Continue meropenem + linezolid (likely pivot back to linezolid, cefepime + metronidazole tomorrow)  Follow OR findings and any new culture data that may be collected  Follow plt / hgb with linezolid use longer term.      Principal Problem:   Necrotizing fasciitis (HCC) Active Problems:   Anemia   Sepsis (HCC)   AKI (acute kidney injury) (HCC)   Diabetic gastroparesis associated with type 1 diabetes mellitus (HCC)   DM type 1 (diabetes mellitus, type 1) (HCC)   Drug-seeking behavior   Sepsis due to cellulitis (HCC)   Cellulitis of lower extremity    Necrotizing fasciitis of pelvic region and thigh (HCC)   Necrotizing fasciitis of lower leg (HCC)   MDD (major depressive disorder), recurrent episode, moderate (HCC)    sodium chloride   Intravenous Once   acetaminophen  1,000 mg Per Tube Q6H   vitamin C  500 mg Per Tube Daily   Chlorhexidine Gluconate Cloth  6 each Topical Daily   feeding supplement (PROSource TF20)  60 mL Per Tube Daily   fiber supplement (BANATROL TF)  60 mL Per Tube BID   folic acid  1 mg Per Tube Daily   gabapentin  300 mg Per Tube Q8H   insulin aspart  0-15 Units Subcutaneous Q4H   insulin detemir  5 Units Subcutaneous BID   linezolid  600 mg Per Tube Q12H   methocarbamol  1,000 mg Per Tube TID   metoCLOPramide  10 mg Per Tube Q8H   midodrine  10 mg Per Tube TID WC   multivitamin with minerals  1 tablet Per Tube Daily   nutrition supplement (JUVEN)  1 packet Per Tube BID BM   mouth rinse  15 mL Mouth Rinse 4 times per day   pantoprazole (PROTONIX) IV  40 mg Intravenous Q12H   silver nitrate applicators  1 Application Topical Once   sodium chloride flush  10-40 mL Intracatheter Q12H    SUBJECTIVE: Not very verbal today - nods appropriately to questions. Denies any new abdominal pain or side effects to current antibiotics.  Plan for b/l transtibial amputation tomorrow with Dr. Lajoyce Corners.    Review of Systems: Review of Systems  Constitutional:  Negative for chills and fever.  Gastrointestinal:  Negative for abdominal pain, nausea and vomiting.     Allergies  Allergen Reactions   Ibuprofen Other (See Comments)    07/23/22 - Worsening kidney function after 3 doses of ibuprofen 400mg .  Creatinine increased from 1.3 to 2.  Creatinine returned to normal after stopping.   Lisinopril Cough, Other (See Comments), Itching and Rash    Breakout in rash    OBJECTIVE: Vitals:   10/21/22 0700 10/21/22 0800 10/21/22 0900 10/21/22 1000  BP: 120/66 128/69 133/71 128/73  Pulse: 98 (!) 113 (!) 103 96  Resp: 14 12 12  11   Temp: (!) 97.2 F (36.2 C) (!) 97.5 F (36.4 C) (!) 97.5 F (36.4 C) (!) 97.5 F (36.4 C)  TempSrc:  Bladder    SpO2: 100% 100% 100% 100%  Weight:      Height:       Body mass index is 34.22 kg/m.  Physical Exam Constitutional:      Appearance: She is ill-appearing.     Comments: Lethargic appearing but arouses easily   HENT:     Nose: Nose normal.     Comments: Enteric feeding tube in place Eyes:     Pupils: Pupils are equal, round, and reactive to light.  Cardiovascular:     Rate and Rhythm: Normal rate.  Pulmonary:     Effort: Pulmonary effort is normal.     Breath sounds: Normal breath sounds.  Musculoskeletal:     Comments: Bilateral wound vacs with good seal. No induration noted to periwound from under opsite. Serosanguinous drainage.   Neurological:     Mental Status: She is oriented to person, place, and time.     Lab Results Lab Results  Component Value Date   WBC 49.6 (H) 10/21/2022   HGB 7.0 (L) 10/21/2022   HCT 21.2 (L) 10/21/2022   MCV 90.2 10/21/2022   PLT 551 (H) 10/21/2022    Lab Results  Component Value Date   CREATININE 2.38 (H) 10/21/2022   BUN 89 (H) 10/21/2022   NA 139 10/21/2022   K 4.6 10/21/2022   CL 110 10/21/2022   CO2 18 (L) 10/21/2022    Lab Results  Component Value Date   ALT 18 10/14/2022   AST 23 10/14/2022   ALKPHOS 193 (H) 10/14/2022   BILITOT 0.4 10/14/2022     Microbiology: Recent Results (from the past 240 hour(s))  Culture, blood (Routine X 2) w Reflex to ID Panel     Status: None   Collection Time: 10/16/22  2:53 AM   Specimen: BLOOD LEFT ARM  Result Value Ref Range Status   Specimen Description BLOOD LEFT ARM  Final   Special Requests   Final    BOTTLES DRAWN AEROBIC ONLY Blood Culture adequate volume   Culture   Final    NO GROWTH 5 DAYS Performed at [facility], [address] [place], [place] [address]    Report Status 10/21/2022 FINAL  Final  Culture, blood (Routine X 2) w Reflex to ID  Panel     Status: None   Collection Time: 10/16/22  2:56 AM   Specimen: BLOOD LEFT ARM  Result Value Ref Range Status   Specimen Description BLOOD LEFT ARM  Final   Special Requests   Final    BOTTLES DRAWN AEROBIC ONLY Blood Culture results  may not be optimal due to an excessive volume of blood received in culture bottles   Culture   Final    NO GROWTH 5 DAYS Performed at [facility], [address], [place], [place] [address]    Report Status 10/21/2022 FINAL  Final  C Difficile Quick Screen (NO PCR Reflex)     Status: None   Collection Time: 10/17/22  1:22 PM   Specimen: STOOL  Result Value Ref Range Status   C Diff antigen NEGATIVE NEGATIVE Final   C Diff toxin NEGATIVE NEGATIVE Final   C Diff interpretation No C. difficile detected.  Final    Comment: Performed at [facility], [address], [place], [place] [address]    Rexene Alberts, MSN, NP-C [facility]  [email or web address] Pager: [phone] Office: [phone] [facility] Main Line: [phone] *Secure Chat Communication Welcome

## 2022-10-22 ENCOUNTER — Encounter (HOSPITAL_COMMUNITY): Payer: Self-pay | Admitting: Orthopedic Surgery

## 2022-10-22 DIAGNOSIS — M726 Necrotizing fasciitis: Secondary | ICD-10-CM | POA: Diagnosis not present

## 2022-10-22 LAB — BASIC METABOLIC PANEL
Anion gap: 8 (ref 5–15)
BUN: 87 mg/dL — ABNORMAL HIGH (ref 6–20)
CO2: 18 mmol/L — ABNORMAL LOW (ref 22–32)
Calcium: 8.4 mg/dL — ABNORMAL LOW (ref 8.9–10.3)
Chloride: 115 mmol/L — ABNORMAL HIGH (ref 98–111)
Creatinine, Ser: 2.16 mg/dL — ABNORMAL HIGH (ref 0.44–1.00)
GFR, Estimated: 31 mL/min — ABNORMAL LOW (ref >60)
Glucose, Bld: 124 mg/dL — ABNORMAL HIGH (ref 70–99)
Potassium: 4.2 mmol/L (ref 3.5–5.1)
Sodium: 141 mmol/L (ref 135–145)

## 2022-10-22 LAB — GLUCOSE, CAPILLARY
Glucose-Capillary: 101 mg/dL — ABNORMAL HIGH (ref 70–99)
Glucose-Capillary: 131 mg/dL — ABNORMAL HIGH (ref 70–99)
Glucose-Capillary: 109 mg/dL — ABNORMAL HIGH (ref 70–99)
Glucose-Capillary: 165 mg/dL — ABNORMAL HIGH (ref 70–99)
Glucose-Capillary: 110 mg/dL — ABNORMAL HIGH (ref 70–99)
Glucose-Capillary: 123 mg/dL — ABNORMAL HIGH (ref 70–99)
Glucose-Capillary: 120 mg/dL — ABNORMAL HIGH (ref 70–99)
Glucose-Capillary: 151 mg/dL — ABNORMAL HIGH (ref 70–99)

## 2022-10-22 LAB — CBC
HCT: 28.8 % — ABNORMAL LOW (ref 36.0–46.0)
Hemoglobin: 9.6 g/dL — ABNORMAL LOW (ref 12.0–15.0)
MCH: 29.7 pg (ref 26.0–34.0)
MCHC: 33.3 g/dL (ref 30.0–36.0)
MCV: 89.2 fL (ref 80.0–100.0)
Platelets: 606 10*3/uL — ABNORMAL HIGH (ref 150–400)
RBC: 3.23 MIL/uL — ABNORMAL LOW (ref 3.87–5.11)
RDW: 17 % — ABNORMAL HIGH (ref 11.5–15.5)
WBC: 44.2 10*3/uL — ABNORMAL HIGH (ref 4.0–10.5)
nRBC: 0.3 % — ABNORMAL HIGH (ref 0.0–0.2)

## 2022-10-22 LAB — PROTIME-INR
INR: 1.6 — ABNORMAL HIGH (ref 0.8–1.2)
Prothrombin Time: 19.1 s — ABNORMAL HIGH (ref 11.4–15.2)

## 2022-10-22 LAB — POCT PREGNANCY, URINE: Preg Test, Ur: NEGATIVE

## 2022-10-22 MED ORDER — CHLORHEXIDINE GLUCONATE 0.12 % MT SOLN: 15.0000 mL | Freq: Once | OROMUCOSAL | Status: DC

## 2022-10-22 MED ORDER — CEFAZOLIN SODIUM-DEXTROSE 2-4 GM/100ML-% IV SOLN
INTRAVENOUS | Status: AC
  Filled 2022-10-22: qty 100

## 2022-10-22 MED ORDER — POVIDONE-IODINE 10 % EX SWAB: 2.0000 | Freq: Once | CUTANEOUS | Status: DC

## 2022-10-22 MED ORDER — LACTATED RINGERS IV SOLN: INTRAVENOUS | Status: DC

## 2022-10-22 MED ORDER — TRANEXAMIC ACID-NACL 1000-0.7 MG/100ML-% IV SOLN
INTRAVENOUS | Status: AC
  Filled 2022-10-22: qty 100

## 2022-10-22 MED ORDER — CHLORHEXIDINE GLUCONATE 4 % EX SOLN: 60.0000 mL | Freq: Once | CUTANEOUS | Status: DC

## 2022-10-22 MED ORDER — TRANEXAMIC ACID-NACL 1000-0.7 MG/100ML-% IV SOLN: 1000.0000 mg | INTRAVENOUS | Status: DC

## 2022-10-22 MED ORDER — CHLORHEXIDINE GLUCONATE 0.12 % MT SOLN
OROMUCOSAL | Status: AC
  Filled 2022-10-22: qty 15

## 2022-10-22 MED ORDER — ORAL CARE MOUTH RINSE: 15.0000 mL | Freq: Once | OROMUCOSAL | Status: DC

## 2022-10-22 MED ORDER — CEFAZOLIN SODIUM-DEXTROSE 2-4 GM/100ML-% IV SOLN: 2.0000 g | INTRAVENOUS | Status: DC

## 2022-10-22 NOTE — Progress Notes (Signed)
10 am meds were given per verbal from Eldridge Scot PharmD after she spoke with Lajoyce Corners MD

## 2022-10-22 NOTE — Progress Notes (Signed)
NAME:  Kelli Hudson, MRN:  [id number], DOB:  08/26/1990, LOS: 19 ADMISSION DATE:  10/03/2022, CONSULTATION DATE:  10/08/22 REFERRING MD:  Lajoyce Corners, CHIEF COMPLAINT: nec fasc   History of Present Illness:  [name] PMH Dm1 diabetic gastroparesis now s/p gastric pacemaker, HTN admitted 8/17 with leg swelling -- BLE foot wounds, which  were being followed outpt by podiatry. Admitted to [facility] for sepsis in this setting, OR with podiatry 8/18 for I&D BLE foot wounds.   ID was consulted 8/20 for ongoing fever. Concern for nec fasc 8/21 ortho consulted -- transferred to [facility], underwent excision and debridement of BLE --extending feet to thighs. EBL from this case is not recorded but pt did receive 4 PRBC 2 FFP as well as crystalloid + colloid, as well as pressors for hemodynamic support .  Remains intubated after case. PCCM consulted for ICU management.   Pertinent  Medical History  DM1 Diabetic gastroparesis S/p gastric pacemaker  HTN  Significant Hospital Events: Including procedures, antibiotic start and stop dates in addition to other pertinent events   8/17 admit to [facility] sepsis BLE wound infections. Podiatry consult 8/18 I&D with podiatry.  8/20 ID consult  8/21 ortho consult. OR for excision and debridement of BLE nec fasc-- feet to thighs. Remained intubated, PCCM consulted.  4 PRBC 2 FFP during case. 8/23-extubated 8/26 Back to OR for wound vac change.  9/1 cefepime/Flagyl changed to meropenem; linezolid continued 9/4 bilateral BKA, Dr. Lajoyce Corners  Interim History / Subjective:  S Cr 2.54 > 2.38 > 2.16 I/O+ 11.4 L total   Objective   Blood pressure 118/67, pulse 93, temperature 97.7 F (36.5 C), resp. rate 10, height 5\' 7"  (1.702 m), weight 99 kg, SpO2 99%.        Intake/Output Summary (Last 24 hours) at 10/22/2022 0733 Last data filed at 10/22/2022 0600 Gross per 24 hour  Intake 1558.14 ml  Output 2025 ml  Net -466.86 ml   Filed Weights   10/20/22 0500 10/21/22 0500 10/22/22 0500   Weight: 99.3 kg 99.1 kg 99 kg    Examination: General: Lethargic, does open eyes.  Still weak HEENT: Oropharynx clear, pupils equal, small Neuro: Very depressed affect, lethargic, does open eyes, turn head (Dilaudid this morning) Chest: Decreased to both bases but no crackles or wheezes Heart: Regular, distant, no murmur Abdomen: Nondistended with positive bowel sounds Skin: Wound vacs, plantar dressings in place   Resolved Hospital Problem list   Depression, suicidal ideation Denies suicidal ideation Psychiatry signed off  Assessment & Plan:  Severe sepsis due to bilateral feet abscess, necrotizing fasciitis, POA.  MRSE, sensitive Pseudomonas S/p bilateral excisional leg and thigh debridement -Continue linezolid and meropenem, appreciate ID management.  Following for any evidence of antibiotic side effects, cytopenias -Planning for bilateral BKA with Dr. Lajoyce Corners today 9/4 -Continue to follow WBC, clinical status -Pain control as ordered -Remains on midodrine 3 times daily, may be able to back off on this in the next 1 to 2 days  Acute blood loss anemia due to operative blood loss -Following CBC -Transfusion goal hemoglobin 7.0  Type 1 diabetes, poorly controlled with hyperglycemia and complicated with gastropathy -Remains on Levemir -Continue sliding scale insulin as per protocol, CBG goal 140-180  Acute kidney injury due to septic ATN, improving Non-anion gap metabolic acidosis Hyponatremia, euvolemic -Follow urine output, BMP -Bicarbonate stable, nongap metabolic acidosis.  Could consider bicarbonate infusion depending on trend.  Deferring for now -Replete electrolytes as indicated  Severe protein calorie malnutrition -  Tube feeding as ordered -Push enteral diet as she can tolerate.  She has severe gastroparesis, associated emesis  Gastroparesis -Gastric pacemaker in place -Reglan as ordered  Hypertension Hyperlipidemia -Holding her antihypertensive regimen.  Will  restart as blood pressure increases  Depression, quite stoic and with appropriate situational depression -Continue Cymbalta and we will discuss possible options to add synergistic antidepressant 9/4   Best Practice (right click and "Reselect all SmartList Selections" daily)   Diet/type: tubefeeds DVT prophylaxis: prophylactic heparin  GI prophylaxis: PPI Lines: Central line and Arterial Line Foley:  Yes, and it is still needed Code Status:  full code Last date of multidisciplinary goals of care discussion [8/31: Patient was updated at bedside, continue full scope of care]  Independent critical care time: NA  Levy Pupa, MD, PhD 10/22/2022, 7:33 AM [place] [facility] [phone] or if no answer before 7:00PM call [phone] For any issues after 7:00PM please call eLink [phone]

## 2022-10-22 NOTE — Plan of Care (Signed)

## 2022-10-22 NOTE — Progress Notes (Addendum)
Patient had this morning at 10:05 o'clock ProSource TF20 and Banatrol TF. Per Dr. Ace Gins the surgery will be rescheduled for Friday. The floor nurse was notify and the patient was transported back to the floor by short stay staff.

## 2022-10-22 NOTE — Interval H&P Note (Signed)
History and Physical Interval Note:  10/22/2022 7:04 AM  Kelli Hudson  has presented today for surgery, with the diagnosis of Gangrene Bilateral Feet.  The various methods of treatment have been discussed with the patient and family. After consideration of risks, benefits and other options for treatment, the patient has consented to  Procedure(s): BILATERAL BELOW KNEE AMPUTATION (Bilateral) as a surgical intervention.  The patient's history has been reviewed, patient examined, no change in status, stable for surgery.  I have reviewed the patient's chart and labs.  Questions were answered to the patient's satisfaction.     Nadara Mustard

## 2022-10-22 NOTE — Progress Notes (Signed)
Nutrition Follow-up  DOCUMENTATION CODES:   Not applicable  INTERVENTION:  Continue tube feeding via Cortrak. Resume at goal after OR: Vital 1.5 at 60 ml/h (1440 ml per day) Prosource TF20 60 ml 1x/d Provides 2240 kcal, 117 gm protein, 1100 ml free water daily Banatrol BID to aid with loose stools 1 packet Juven BID, each packet provides 95 calories, 2.5 grams of protein (collagen), and 9.8 grams of carbohydrate (3 grams sugar); also contains 7 grams of L-arginine and L-glutamine, 300 mg vitamin C, 15 mg vitamin E, 1.2 mcg vitamin B-12, 9.5 mg zinc, 200 mg calcium, and 1.5 g  Calcium Beta-hydroxy-Beta-methylbutyrate to support wound healing MVI with minerals daily Vitamin C to 500mg  1x/d. Stop after 30 doses. No signs of deficiency per serum levels (previously ordered by MD)  NUTRITION DIAGNOSIS:   Increased nutrient needs related to wound healing as evidenced by estimated needs.  GOAL:   Patient will meet greater than or equal to 90% of their needs - Not met, tube feeds on hold for OR   MONITOR:   PO intake, Supplement acceptance  REASON FOR ASSESSMENT:   Consult Enteral/tube feeding initiation and management  ASSESSMENT:   32 y.o. female admits related to leg swelling. PMH includes: acute H. Pylori gastric ulcer, DM, gastroparesis, DKA, GERD, HTN, hyperthyroidism, normocytic anemia. Pt is currently receiving medical management related to sepsis due to cellulitis.  Meds reviewed:  Vitamin C, folic acid, sliding scale insulin, reglan, MVI, Juven. Labs reviewed: BUN/Creatinine elevated.   Pt is currently NPO with cortrak tube still in place. Tube feeds are currently on hold and pt is planning for surgical intervention today for bilateral BKA. Spoke with RN who reports that the pt had an episode of emesis yesterday. RD will continue to monitor for TF advancement post surgical intervention.   Diet Order:   Diet Order             Diet NPO time specified  Diet effective ____                    EDUCATION NEEDS:   Not appropriate for education at this time  Skin:  Skin Assessment: Skin Integrity Issues: Skin Integrity Issues:: Other (Comment) Other: Cellulitis of bilateral feet  Last BM:  9/3 - type 6  Height:   Ht Readings from Last 1 Encounters:  10/08/22 5\' 7"  (1.702 m)    Weight:   Wt Readings from Last 1 Encounters:  10/22/22 99 kg    Ideal Body Weight:  61.4 kg  BMI:  Body mass index is 34.18 kg/m.  Estimated Nutritional Needs:   Kcal:  2200-2500 kcal/d  Protein:  115-130g/d  Fluid:  >/= 2.3 L  Bethann Humble, RD, LDN, CNSC.

## 2022-10-22 NOTE — Anesthesia Preprocedure Evaluation (Addendum)
Anesthesia Evaluation  Patient identified by MRN, date of birth, ID band Patient awake    Reviewed: Allergy & Precautions, H&P , NPO status , Patient's Chart, lab work & pertinent test results  Airway Mallampati: III  TM Distance: >3 FB Neck ROM: Full    Dental  (+) Dental Advisory Given   Pulmonary neg pulmonary ROS   Pulmonary exam normal        Cardiovascular hypertension, Pt. on home beta blockers and Pt. on medications Normal cardiovascular exam     Neuro/Psych  PSYCHIATRIC DISORDERS Anxiety Depression    negative neurological ROS     GI/Hepatic Neg liver ROS, PUD,GERD  ,, Gastroparesis    Endo/Other  diabetes, Type 1, Insulin Dependent   Obesity Na 132 Ca 7.9   Renal/GU Renal InsufficiencyRenal disease     Musculoskeletal negative musculoskeletal ROS (+)    Abdominal   Peds negative pediatric ROS (+)  Hematology  (+) Blood dyscrasia, anemia  INR 1.6    Anesthesia Other Findings   Reproductive/Obstetrics                             Anesthesia Physical Anesthesia Plan  ASA: 4  Anesthesia Plan: General   Post-op Pain Management: Ketamine IV*, Ofirmev IV (intra-op)* and Dilaudid IV   Induction: Intravenous and Rapid sequence  PONV Risk Score and Plan: 3 and Ondansetron, Dexamethasone, Treatment may vary due to age or medical condition and Midazolam  Airway Management Planned: Oral ETT  Additional Equipment: None  Intra-op Plan:   Post-operative Plan: Extubation in OR  Informed Consent: I have reviewed the patients History and Physical, chart, labs and discussed the procedure including the risks, benefits and alternatives for the proposed anesthesia with the patient or authorized representative who has indicated his/her understanding and acceptance.     Dental advisory given  Plan Discussed with: CRNA and Anesthesiologist  Anesthesia Plan Comments:          Anesthesia Quick Evaluation

## 2022-10-23 DIAGNOSIS — M726 Necrotizing fasciitis: Secondary | ICD-10-CM | POA: Diagnosis not present

## 2022-10-23 LAB — BASIC METABOLIC PANEL
Anion gap: 6 (ref 5–15)
BUN: 84 mg/dL — ABNORMAL HIGH (ref 6–20)
CO2: 20 mmol/L — ABNORMAL LOW (ref 22–32)
Calcium: 8.7 mg/dL — ABNORMAL LOW (ref 8.9–10.3)
Chloride: 113 mmol/L — ABNORMAL HIGH (ref 98–111)
Creatinine, Ser: 2.04 mg/dL — ABNORMAL HIGH (ref 0.44–1.00)
GFR, Estimated: 33 mL/min — ABNORMAL LOW (ref >60)
Glucose, Bld: 123 mg/dL — ABNORMAL HIGH (ref 70–99)
Potassium: 4.2 mmol/L (ref 3.5–5.1)
Sodium: 139 mmol/L (ref 135–145)

## 2022-10-23 LAB — CBC
HCT: 31 % — ABNORMAL LOW (ref 36.0–46.0)
Hemoglobin: 10.2 g/dL — ABNORMAL LOW (ref 12.0–15.0)
MCH: 29.8 pg (ref 26.0–34.0)
MCHC: 32.9 g/dL (ref 30.0–36.0)
MCV: 90.6 fL (ref 80.0–100.0)
Platelets: 650 10*3/uL — ABNORMAL HIGH (ref 150–400)
RBC: 3.42 MIL/uL — ABNORMAL LOW (ref 3.87–5.11)
RDW: 17.8 % — ABNORMAL HIGH (ref 11.5–15.5)
WBC: 43.9 10*3/uL — ABNORMAL HIGH (ref 4.0–10.5)
nRBC: 0.3 % — ABNORMAL HIGH (ref 0.0–0.2)

## 2022-10-23 LAB — GLUCOSE, CAPILLARY
Glucose-Capillary: 110 mg/dL — ABNORMAL HIGH (ref 70–99)
Glucose-Capillary: 131 mg/dL — ABNORMAL HIGH (ref 70–99)
Glucose-Capillary: 136 mg/dL — ABNORMAL HIGH (ref 70–99)
Glucose-Capillary: 155 mg/dL — ABNORMAL HIGH (ref 70–99)
Glucose-Capillary: 204 mg/dL — ABNORMAL HIGH (ref 70–99)
Glucose-Capillary: 214 mg/dL — ABNORMAL HIGH (ref 70–99)

## 2022-10-23 LAB — MAGNESIUM: Magnesium: 2.1 mg/dL (ref 1.7–2.4)

## 2022-10-23 LAB — SURGICAL PCR SCREEN
MRSA, PCR: NEGATIVE
Staphylococcus aureus: NEGATIVE

## 2022-10-23 MED ORDER — DULOXETINE HCL 30 MG PO CPEP
30.0000 mg | ORAL_CAPSULE | Freq: Every day | ORAL | Status: DC
  Administered 2022-10-23 – 2022-11-20 (×25): 30 mg via ORAL
  Filled 2022-10-23 (×27): qty 1

## 2022-10-23 MED ORDER — CHLORHEXIDINE GLUCONATE 4 % EX SOLN
60.0000 mL | Freq: Once | CUTANEOUS | Status: DC
  Filled 2022-10-23: qty 60

## 2022-10-23 MED ORDER — TRANEXAMIC ACID 1000 MG/10ML IV SOLN
2000.0000 mg | INTRAVENOUS | Status: DC
  Filled 2022-10-23: qty 20

## 2022-10-23 MED ORDER — CEFAZOLIN SODIUM-DEXTROSE 2-4 GM/100ML-% IV SOLN
2.0000 g | INTRAVENOUS | Status: AC
  Administered 2022-10-24: 2 g via INTRAVENOUS
  Filled 2022-10-23: qty 100

## 2022-10-23 MED ORDER — TRANEXAMIC ACID-NACL 1000-0.7 MG/100ML-% IV SOLN
1000.0000 mg | INTRAVENOUS | Status: AC
  Administered 2022-10-24: 1000 mg via INTRAVENOUS
  Filled 2022-10-23: qty 100

## 2022-10-23 MED ORDER — CEFAZOLIN SODIUM-DEXTROSE 2-4 GM/100ML-% IV SOLN: 2.0000 g | INTRAVENOUS | Status: DC

## 2022-10-23 MED ORDER — POVIDONE-IODINE 10 % EX SWAB: 2.0000 | Freq: Once | CUTANEOUS | Status: DC

## 2022-10-23 NOTE — Plan of Care (Signed)
VSS, afebrile. Pt's sats>95% on RA. Pt medicated for BLE pain with prn pain med, with relief. Good uop, draining clear yellow urine. Continues on D10 gtt to maintain blood sugar, no s/s of hypo/hyperglycemia. No s/s of distress. Wound vacs patent and intact. Will continue to monitor.

## 2022-10-23 NOTE — Progress Notes (Signed)
NAME:  Kelli Hudson, MRN:  [id number], DOB:  [birth year], LOS: 20 ADMISSION DATE:  10/03/2022, CONSULTATION DATE:  10/08/22 REFERRING MD:  Lajoyce Corners, CHIEF COMPLAINT: nec fasc   History of Present Illness:  [name] PMH Dm1 diabetic gastroparesis now s/p gastric pacemaker, HTN admitted 8/17 with leg swelling -- BLE foot wounds, which  were being followed outpt by podiatry. Admitted to [facility] for sepsis in this setting, OR with podiatry 8/18 for I&D BLE foot wounds.   ID was consulted 8/20 for ongoing fever. Concern for nec fasc 8/21 ortho consulted -- transferred to [facility], underwent excision and debridement of BLE --extending feet to thighs. EBL from this case is not recorded but pt did receive 4 PRBC 2 FFP as well as crystalloid + colloid, as well as pressors for hemodynamic support .  Remains intubated after case. PCCM consulted for ICU management.   Pertinent  Medical History  DM1 Diabetic gastroparesis S/p gastric pacemaker  HTN  Significant Hospital Events: Including procedures, antibiotic start and stop dates in addition to other pertinent events   8/17 admit to [facility] sepsis BLE wound infections. Podiatry consult 8/18 I&D with podiatry.  8/20 ID consult  8/21 ortho consult. OR for excision and debridement of BLE nec fasc-- feet to thighs. Remained intubated, PCCM consulted.  4 PRBC 2 FFP during case. 8/23-extubated 8/26 Back to OR for wound vac change.  9/1 cefepime/Flagyl changed to meropenem; linezolid continued 9/6 bilateral BKA, Dr. Lajoyce Corners  Interim History / Subjective:  Surgery had to be postponed on 9/4, planning now for 9/6 Hemodynamically stable S Cr 2.04 (improved) I/O+ 10.6 L total Tolerating some p.o. this morning, clears, soup   Objective   Blood pressure (!) 102/57, pulse 98, temperature 98.6 F (37 C), temperature source Oral, resp. rate (!) 9, height 5\' 7"  (1.702 m), weight 99 kg, SpO2 99%.        Intake/Output Summary (Last 24 hours) at 10/23/2022 0736 Last data  filed at 10/23/2022 0600 Gross per 24 hour  Intake 861.1 ml  Output 1630 ml  Net -768.9 ml   Filed Weights   10/20/22 0500 10/21/22 0500 10/22/22 0500  Weight: 99.3 kg 99.1 kg 99 kg    Examination: General: More awake, ill-appearing, depressed affect HEENT: Oropharynx clear, strong voice, no stridor, no secretions Neuro: Awake, alert, eyes open, follows commands and answers questions appropriately. Chest: Decreased to both bases, otherwise clear Heart: Regular, distant, no murmur Abdomen: Positive bowel sounds, nondistended Skin: Plantar wound dressings in place, wound vacs in place   Resolved Hospital Problem list   Depression, suicidal ideation Denies suicidal ideation Psychiatry signed off  Assessment & Plan:  Severe sepsis due to bilateral feet abscess, necrotizing fasciitis, POA.  MRSE, sensitive Pseudomonas S/p bilateral excisional leg and thigh debridement -Pain control as ordered -Planning for bilateral BKA with Dr. Lajoyce Corners on 9/6 -Continue to follow WBC -Midodrine discontinued on 9/4 -Continue meropenem and linezolid.  Appreciate ID assistance  Acute blood loss anemia due to operative blood loss -Following CBC -Transfusion goal hemoglobin 7.0  Type 1 diabetes, poorly controlled with hyperglycemia and complicated with gastropathy -Continue Levemir -Continue sliding scale insulin as per protocol, CBG goal 140-180  Acute kidney injury due to septic ATN, improving Non-anion gap metabolic acidosis Hyponatremia, euvolemic -Following BMP, urine output -Bicarbonate stable, anion gap normal.  Could consider bicarbonate infusion depending on trend.  Deferring for now -Replete electrolytes if indicated  Severe protein calorie malnutrition -Pushing enteral diet as she can tolerate, has severe  gastroparesis, emesis.  Bridging the gap with TF via NG tube  Gastroparesis -Gastric pacemaker in place -Reglan as ordered  Hypertension Hyperlipidemia -Antihypertensive  regimen is on hold, has been normotensive, likely due to resolved sepsis and the need for pain medications  Depression, quite stoic and with appropriate situational depression -On Cymbalta at home, has not been receiving because it cannot be given via NG tube.  Hopefully can restart now that she is tolerating some p.o. -Consider adding on adjunct therapy that would be synergistic with Cymbalta   Best Practice (right click and "Reselect all SmartList Selections" daily)   Diet/type: tubefeeds DVT prophylaxis: prophylactic heparin  GI prophylaxis: PPI Lines: Central line and Arterial Line Foley:  Yes, and it is still needed Code Status:  full code Last date of multidisciplinary goals of care discussion [8/31: Patient was updated at bedside, continue full scope of care] Family and patient updated 9/4, patient on 9/5  Independent critical care time: NA  Levy Pupa, MD, PhD 10/23/2022, 7:36 AM [facility] [phone] or if no answer before 7:00PM call [phone] For any issues after 7:00PM please call eLink [phone]

## 2022-10-23 NOTE — Plan of Care (Signed)
  Problem: Education: Goal: Knowledge of General Education information will improve Description: Including pain rating scale, medication(s)/side effects and non-pharmacologic comfort measures Outcome: Progressing   Problem: Health Behavior/Discharge Planning: Goal: Ability to manage health-related needs will improve Outcome: Progressing   Problem: Clinical Measurements: Goal: Ability to maintain clinical measurements within normal limits will improve Outcome: Progressing Goal: Will remain free from infection Outcome: Progressing Goal: Diagnostic test results will improve Outcome: Progressing Goal: Respiratory complications will improve Outcome: Progressing Goal: Cardiovascular complication will be avoided Outcome: Progressing   Problem: Activity: Goal: Risk for activity intolerance will decrease Outcome: Progressing   Problem: Nutrition: Goal: Adequate nutrition will be maintained Outcome: Progressing   Problem: Coping: Goal: Level of anxiety will decrease Outcome: Progressing   Problem: Elimination: Goal: Will not experience complications related to bowel motility Outcome: Progressing Goal: Will not experience complications related to urinary retention Outcome: Progressing   Problem: Pain Managment: Goal: General experience of comfort will improve Outcome: Progressing   Problem: Safety: Goal: Ability to remain free from injury will improve Outcome: Progressing   Problem: Skin Integrity: Goal: Risk for impaired skin integrity will decrease Outcome: Progressing   Problem: Education: Goal: Ability to describe self-care measures that may prevent or decrease complications (Diabetes Survival Skills Education) will improve Outcome: Progressing   Problem: Coping: Goal: Ability to adjust to condition or change in health will improve Outcome: Progressing   Problem: Fluid Volume: Goal: Ability to maintain a balanced intake and output will improve Outcome:  Progressing   Problem: Health Behavior/Discharge Planning: Goal: Ability to identify and utilize available resources and services will improve Outcome: Progressing Goal: Ability to manage health-related needs will improve Outcome: Progressing   Problem: Metabolic: Goal: Ability to maintain appropriate glucose levels will improve Outcome: Progressing   Problem: Nutritional: Goal: Maintenance of adequate nutrition will improve Outcome: Progressing Goal: Progress toward achieving an optimal weight will improve Outcome: Progressing   Problem: Skin Integrity: Goal: Risk for impaired skin integrity will decrease Outcome: Progressing   Problem: Tissue Perfusion: Goal: Adequacy of tissue perfusion will improve Outcome: Progressing   Problem: Education: Goal: Knowledge of the prescribed therapeutic regimen will improve Outcome: Progressing Goal: Ability to verbalize activity precautions or restrictions will improve Outcome: Progressing Goal: Understanding of discharge needs will improve Outcome: Progressing   Problem: Activity: Goal: Ability to perform//tolerate increased activity and mobilize with assistive devices will improve Outcome: Progressing   Problem: Clinical Measurements: Goal: Postoperative complications will be avoided or minimized Outcome: Progressing   Problem: Self-Care: Goal: Ability to meet self-care needs will improve Outcome: Progressing   Problem: Self-Concept: Goal: Ability to maintain and perform role responsibilities to the fullest extent possible will improve Outcome: Progressing   Problem: Pain Management: Goal: Pain level will decrease with appropriate interventions Outcome: Progressing   Problem: Skin Integrity: Goal: Demonstration of wound healing without infection will improve Outcome: Progressing   Problem: Activity: Goal: Ability to tolerate increased activity will improve Outcome: Progressing   Problem: Respiratory: Goal: Ability  to maintain a clear airway and adequate ventilation will improve Outcome: Progressing   Problem: Role Relationship: Goal: Method of communication will improve Outcome: Progressing

## 2022-10-23 NOTE — Plan of Care (Signed)
  Problem: Education: Goal: Knowledge of the prescribed therapeutic regimen will improve Outcome: Progressing Goal: Ability to verbalize activity precautions or restrictions will improve Outcome: Progressing   Problem: Clinical Measurements: Goal: Will remain free from infection Outcome: Not Progressing   Problem: Coping: Goal: Level of anxiety will decrease Outcome: Not Progressing   Problem: Pain Managment: Goal: General experience of comfort will improve Outcome: Not Progressing

## 2022-10-24 ENCOUNTER — Encounter (HOSPITAL_COMMUNITY)
Admission: EM | Disposition: A | Discharge status: Skilled Nursing Facility | Payer: Self-pay | Source: Home / Self Care | Attending: Internal Medicine

## 2022-10-24 ENCOUNTER — Inpatient Hospital Stay (HOSPITAL_COMMUNITY): Payer: Medicaid Other | Admitting: Anesthesiology

## 2022-10-24 ENCOUNTER — Encounter (HOSPITAL_COMMUNITY): Payer: Self-pay | Admitting: Orthopedic Surgery

## 2022-10-24 ENCOUNTER — Other Ambulatory Visit: Payer: Self-pay

## 2022-10-24 DIAGNOSIS — A419 Sepsis, unspecified organism: Secondary | ICD-10-CM | POA: Diagnosis not present

## 2022-10-24 DIAGNOSIS — I96 Gangrene, not elsewhere classified: Secondary | ICD-10-CM | POA: Diagnosis not present

## 2022-10-24 DIAGNOSIS — M726 Necrotizing fasciitis: Secondary | ICD-10-CM | POA: Diagnosis not present

## 2022-10-24 DIAGNOSIS — E785 Hyperlipidemia, unspecified: Secondary | ICD-10-CM | POA: Diagnosis not present

## 2022-10-24 DIAGNOSIS — L0390 Cellulitis, unspecified: Secondary | ICD-10-CM | POA: Diagnosis not present

## 2022-10-24 HISTORY — PX: APPLICATION OF WOUND VAC: SHX5189

## 2022-10-24 HISTORY — PX: AMPUTATION: SHX166

## 2022-10-24 LAB — CBC
HCT: 26.9 % — ABNORMAL LOW (ref 36.0–46.0)
Hemoglobin: 8.7 g/dL — ABNORMAL LOW (ref 12.0–15.0)
MCH: 30.1 pg (ref 26.0–34.0)
MCHC: 32.3 g/dL (ref 30.0–36.0)
MCV: 93.1 fL (ref 80.0–100.0)
Platelets: 546 10*3/uL — ABNORMAL HIGH (ref 150–400)
RBC: 2.89 MIL/uL — ABNORMAL LOW (ref 3.87–5.11)
RDW: 17.5 % — ABNORMAL HIGH (ref 11.5–15.5)
WBC: 33.8 10*3/uL — ABNORMAL HIGH (ref 4.0–10.5)
nRBC: 0.6 % — ABNORMAL HIGH (ref 0.0–0.2)

## 2022-10-24 LAB — PHOSPHORUS: Phosphorus: 3.5 mg/dL (ref 2.5–4.6)

## 2022-10-24 LAB — GLUCOSE, CAPILLARY
Glucose-Capillary: 120 mg/dL — ABNORMAL HIGH (ref 70–99)
Glucose-Capillary: 141 mg/dL — ABNORMAL HIGH (ref 70–99)
Glucose-Capillary: 143 mg/dL — ABNORMAL HIGH (ref 70–99)
Glucose-Capillary: 144 mg/dL — ABNORMAL HIGH (ref 70–99)
Glucose-Capillary: 172 mg/dL — ABNORMAL HIGH (ref 70–99)
Glucose-Capillary: 206 mg/dL — ABNORMAL HIGH (ref 70–99)
Glucose-Capillary: 226 mg/dL — ABNORMAL HIGH (ref 70–99)

## 2022-10-24 LAB — BASIC METABOLIC PANEL
Anion gap: 5 (ref 5–15)
BUN: 80 mg/dL — ABNORMAL HIGH (ref 6–20)
CO2: 19 mmol/L — ABNORMAL LOW (ref 22–32)
Calcium: 7.9 mg/dL — ABNORMAL LOW (ref 8.9–10.3)
Chloride: 108 mmol/L (ref 98–111)
Creatinine, Ser: 1.82 mg/dL — ABNORMAL HIGH (ref 0.44–1.00)
GFR, Estimated: 38 mL/min — ABNORMAL LOW (ref >60)
Glucose, Bld: 152 mg/dL — ABNORMAL HIGH (ref 70–99)
Potassium: 4.6 mmol/L (ref 3.5–5.1)
Sodium: 132 mmol/L — ABNORMAL LOW (ref 135–145)

## 2022-10-24 LAB — PREPARE RBC (CROSSMATCH)

## 2022-10-24 LAB — MAGNESIUM: Magnesium: 1.9 mg/dL (ref 1.7–2.4)

## 2022-10-24 SURGERY — AMPUTATION BELOW KNEE
Anesthesia: General | Site: Leg Lower | Laterality: Bilateral

## 2022-10-24 MED ORDER — OXYCODONE HCL 5 MG PO TABS: 5.0000 mg | ORAL_TABLET | Freq: Once | ORAL | Status: DC | PRN

## 2022-10-24 MED ORDER — MAGNESIUM CITRATE PO SOLN: 1.0000 | Freq: Once | ORAL | Status: DC | PRN

## 2022-10-24 MED ORDER — OXYCODONE HCL 5 MG/5ML PO SOLN: 5.0000 mg | Freq: Once | ORAL | Status: DC | PRN

## 2022-10-24 MED ORDER — KETAMINE HCL 50 MG/5ML IJ SOSY
PREFILLED_SYRINGE | INTRAMUSCULAR | Status: AC
  Filled 2022-10-24: qty 5

## 2022-10-24 MED ORDER — 0.9 % SODIUM CHLORIDE (POUR BTL) OPTIME
TOPICAL | Status: DC | PRN
  Administered 2022-10-24: 1000 mL

## 2022-10-24 MED ORDER — PROPOFOL 10 MG/ML IV BOLUS
INTRAVENOUS | Status: DC | PRN
Start: 2022-10-24 — End: 2022-10-24
  Administered 2022-10-24: 150 mg via INTRAVENOUS

## 2022-10-24 MED ORDER — PHENOL 1.4 % MT LIQD: 1.0000 | OROMUCOSAL | Status: DC | PRN

## 2022-10-24 MED ORDER — ACETAMINOPHEN 10 MG/ML IV SOLN
INTRAVENOUS | Status: AC
  Filled 2022-10-24: qty 100

## 2022-10-24 MED ORDER — ROCURONIUM BROMIDE 10 MG/ML (PF) SYRINGE
PREFILLED_SYRINGE | INTRAVENOUS | Status: DC | PRN
  Administered 2022-10-24: 80 mg via INTRAVENOUS

## 2022-10-24 MED ORDER — PROMETHAZINE HCL 25 MG/ML IJ SOLN: 6.2500 mg | INTRAMUSCULAR | Status: DC | PRN

## 2022-10-24 MED ORDER — SODIUM CHLORIDE 0.9 % IV SOLN: INTRAVENOUS | Status: DC | PRN

## 2022-10-24 MED ORDER — SODIUM CHLORIDE 0.9 % IV SOLN: 10.0000 mL/h | Freq: Once | INTRAVENOUS | Status: DC

## 2022-10-24 MED ORDER — BISACODYL 5 MG PO TBEC: 5.0000 mg | DELAYED_RELEASE_TABLET | Freq: Every day | ORAL | Status: DC | PRN

## 2022-10-24 MED ORDER — INSULIN ASPART 100 UNIT/ML IJ SOLN: 0.0000 [IU] | INTRAMUSCULAR | Status: DC | PRN

## 2022-10-24 MED ORDER — FENTANYL CITRATE (PF) 100 MCG/2ML IJ SOLN
25.0000 ug | INTRAMUSCULAR | Status: DC | PRN
  Administered 2022-10-24: 25 ug via INTRAVENOUS

## 2022-10-24 MED ORDER — MIDAZOLAM HCL 2 MG/2ML IJ SOLN
INTRAMUSCULAR | Status: AC
  Filled 2022-10-24: qty 2

## 2022-10-24 MED ORDER — MIDAZOLAM HCL 2 MG/2ML IJ SOLN
INTRAMUSCULAR | Status: DC | PRN
  Administered 2022-10-24: 2 mg via INTRAVENOUS

## 2022-10-24 MED ORDER — CHLORHEXIDINE GLUCONATE 0.12 % MT SOLN: 15.0000 mL | Freq: Once | OROMUCOSAL | Status: AC

## 2022-10-24 MED ORDER — PHENYLEPHRINE 80 MCG/ML (10ML) SYRINGE FOR IV PUSH (FOR BLOOD PRESSURE SUPPORT)
PREFILLED_SYRINGE | INTRAVENOUS | Status: DC | PRN
  Administered 2022-10-24 (×3): 80 ug via INTRAVENOUS
  Administered 2022-10-24: 160 ug via INTRAVENOUS

## 2022-10-24 MED ORDER — VITAMIN C 500 MG PO TABS
1000.0000 mg | ORAL_TABLET | Freq: Every day | ORAL | Status: DC
  Administered 2022-10-24 – 2022-10-25 (×2): 1000 mg via ORAL
  Filled 2022-10-24 (×2): qty 2

## 2022-10-24 MED ORDER — KETAMINE HCL 10 MG/ML IJ SOLN
INTRAMUSCULAR | Status: DC | PRN
Start: 2022-10-24 — End: 2022-10-24
  Administered 2022-10-24: 30 mg via INTRAVENOUS
  Administered 2022-10-24: 20 mg via INTRAVENOUS

## 2022-10-24 MED ORDER — LIDOCAINE 2% (20 MG/ML) 5 ML SYRINGE
INTRAMUSCULAR | Status: DC | PRN
  Administered 2022-10-24: 60 mg via INTRAVENOUS

## 2022-10-24 MED ORDER — ORAL CARE MOUTH RINSE: 15.0000 mL | Freq: Once | OROMUCOSAL | Status: AC

## 2022-10-24 MED ORDER — ONDANSETRON HCL 4 MG/2ML IJ SOLN
INTRAMUSCULAR | Status: DC | PRN
  Administered 2022-10-24: 4 mg via INTRAVENOUS

## 2022-10-24 MED ORDER — ZINC SULFATE 220 (50 ZN) MG PO CAPS
220.0000 mg | ORAL_CAPSULE | Freq: Every day | ORAL | Status: DC
  Administered 2022-10-24 – 2022-10-25 (×2): 220 mg via ORAL
  Filled 2022-10-24 (×2): qty 1

## 2022-10-24 MED ORDER — DOCUSATE SODIUM 100 MG PO CAPS
100.0000 mg | ORAL_CAPSULE | Freq: Every day | ORAL | Status: DC
  Administered 2022-10-25: 100 mg via ORAL
  Filled 2022-10-24: qty 1

## 2022-10-24 MED ORDER — ACETAMINOPHEN 10 MG/ML IV SOLN
INTRAVENOUS | Status: DC | PRN
Start: 2022-10-24 — End: 2022-10-24
  Administered 2022-10-24: 1000 mg via INTRAVENOUS

## 2022-10-24 MED ORDER — SUGAMMADEX SODIUM 200 MG/2ML IV SOLN
INTRAVENOUS | Status: DC | PRN
  Administered 2022-10-24 (×2): 200 mg via INTRAVENOUS

## 2022-10-24 MED ORDER — ONDANSETRON HCL 4 MG/2ML IJ SOLN
4.0000 mg | Freq: Four times a day (QID) | INTRAMUSCULAR | Status: DC | PRN
  Administered 2022-10-28 – 2022-11-06 (×2): 4 mg via INTRAVENOUS
  Administered 2022-11-12: 8 mg via INTRAVENOUS
  Filled 2022-10-24 (×2): qty 2

## 2022-10-24 MED ORDER — FENTANYL CITRATE (PF) 250 MCG/5ML IJ SOLN
INTRAMUSCULAR | Status: AC
  Filled 2022-10-24: qty 5

## 2022-10-24 MED ORDER — MAGNESIUM SULFATE 2 GM/50ML IV SOLN
2.0000 g | Freq: Every day | INTRAVENOUS | Status: AC | PRN
  Administered 2022-11-08: 2 g via INTRAVENOUS
  Filled 2022-10-24: qty 50

## 2022-10-24 MED ORDER — LACTATED RINGERS IV SOLN: INTRAVENOUS | Status: DC

## 2022-10-24 MED ORDER — SODIUM CHLORIDE 0.9 % IV SOLN: INTRAVENOUS | Status: DC

## 2022-10-24 MED ORDER — HYDRALAZINE HCL 20 MG/ML IJ SOLN: 5.0000 mg | INTRAMUSCULAR | Status: DC | PRN

## 2022-10-24 MED ORDER — LABETALOL HCL 5 MG/ML IV SOLN: 10.0000 mg | INTRAVENOUS | Status: DC | PRN

## 2022-10-24 MED ORDER — FENTANYL CITRATE (PF) 100 MCG/2ML IJ SOLN
INTRAMUSCULAR | Status: AC
  Filled 2022-10-24: qty 2

## 2022-10-24 MED ORDER — PHENYLEPHRINE HCL-NACL 20-0.9 MG/250ML-% IV SOLN
INTRAVENOUS | Status: DC | PRN
  Administered 2022-10-24: 25 ug/min via INTRAVENOUS

## 2022-10-24 MED ORDER — POTASSIUM CHLORIDE CRYS ER 20 MEQ PO TBCR: 20.0000 meq | EXTENDED_RELEASE_TABLET | Freq: Every day | ORAL | Status: DC | PRN

## 2022-10-24 MED ORDER — CHLORHEXIDINE GLUCONATE 0.12 % MT SOLN
OROMUCOSAL | Status: AC
  Administered 2022-10-24: 15 mL via OROMUCOSAL
  Filled 2022-10-24: qty 15

## 2022-10-24 MED ORDER — FENTANYL CITRATE (PF) 250 MCG/5ML IJ SOLN
INTRAMUSCULAR | Status: DC | PRN
  Administered 2022-10-24 (×2): 50 ug via INTRAVENOUS

## 2022-10-24 MED ORDER — ALBUMIN HUMAN 5 % IV SOLN
INTRAVENOUS | Status: DC | PRN
Start: 2022-10-24 — End: 2022-10-24

## 2022-10-24 MED ORDER — JUVEN PO PACK
1.0000 | PACK | Freq: Two times a day (BID) | ORAL | Status: DC
  Administered 2022-10-25: 1 via ORAL
  Filled 2022-10-24: qty 1

## 2022-10-24 MED ORDER — POLYETHYLENE GLYCOL 3350 17 G PO PACK: 17.0000 g | PACK | Freq: Every day | ORAL | Status: DC | PRN

## 2022-10-24 MED ORDER — PANTOPRAZOLE SODIUM 40 MG PO TBEC
40.0000 mg | DELAYED_RELEASE_TABLET | Freq: Every day | ORAL | Status: DC
  Administered 2022-10-24 – 2022-10-25 (×2): 40 mg via ORAL
  Filled 2022-10-24 (×2): qty 1

## 2022-10-24 MED ORDER — SODIUM CHLORIDE 0.9 % IV SOLN
1.0000 g | Freq: Three times a day (TID) | INTRAVENOUS | Status: DC
  Administered 2022-10-25 – 2022-10-27 (×7): 1 g via INTRAVENOUS
  Filled 2022-10-24 (×9): qty 20

## 2022-10-24 MED ORDER — METOPROLOL TARTRATE 5 MG/5ML IV SOLN: 2.0000 mg | INTRAVENOUS | Status: DC | PRN

## 2022-10-24 MED ORDER — GUAIFENESIN-DM 100-10 MG/5ML PO SYRP: 15.0000 mL | ORAL_SOLUTION | ORAL | Status: DC | PRN

## 2022-10-24 MED ORDER — ALUM & MAG HYDROXIDE-SIMETH 200-200-20 MG/5ML PO SUSP: 15.0000 mL | ORAL | Status: DC | PRN

## 2022-10-24 SURGICAL SUPPLY — 45 items
BAG COUNTER SPONGE SURGICOUNT (BAG) IMPLANT
BAG SPNG CNTER NS LX DISP (BAG)
BLADE SAW RECIP 87.9 MT (BLADE) ×1 IMPLANT
BLADE SURG 21 STRL SS (BLADE) ×1 IMPLANT
BNDG CMPR 5X6 CHSV STRCH STRL (GAUZE/BANDAGES/DRESSINGS)
BNDG COHESIVE 6X5 TAN ST LF (GAUZE/BANDAGES/DRESSINGS) IMPLANT
CANISTER WOUND CARE 500ML ATS (WOUND CARE) ×1 IMPLANT
CANNULA 5.75X7 CRYSTAL CLEAR (CANNULA) IMPLANT
COVER SURGICAL LIGHT HANDLE (MISCELLANEOUS) ×1 IMPLANT
CUFF TOURN SGL QUICK 34 (TOURNIQUET CUFF) ×1
CUFF TRNQT CYL 34X4.125X (TOURNIQUET CUFF) ×1 IMPLANT
DRAPE BILATERAL LIMB T (DRAPES) IMPLANT
DRAPE DERMATAC (DRAPES) IMPLANT
DRAPE INCISE IOBAN 66X45 STRL (DRAPES) ×1 IMPLANT
DRAPE U-SHAPE 47X51 STRL (DRAPES) ×1 IMPLANT
DRESSING PREVENA PLUS CUSTOM (GAUZE/BANDAGES/DRESSINGS) ×1 IMPLANT
DRSG PREVENA PLUS CUSTOM (GAUZE/BANDAGES/DRESSINGS) ×2
DURAPREP 26ML APPLICATOR (WOUND CARE) ×1 IMPLANT
ELECT REM PT RETURN 9FT ADLT (ELECTROSURGICAL) ×1
ELECTRODE REM PT RTRN 9FT ADLT (ELECTROSURGICAL) ×1 IMPLANT
GLOVE BIOGEL PI IND STRL 9 (GLOVE) ×1 IMPLANT
GLOVE SURG ORTHO 9.0 STRL STRW (GLOVE) ×1 IMPLANT
GOWN STRL REUS W/ TWL XL LVL3 (GOWN DISPOSABLE) ×2 IMPLANT
GOWN STRL REUS W/TWL XL LVL3 (GOWN DISPOSABLE) ×2
GRAFT SKIN WND SURGICLOSE M95 (Tissue) IMPLANT
KIT BASIN OR (CUSTOM PROCEDURE TRAY) ×1 IMPLANT
KIT TURNOVER KIT B (KITS) ×1 IMPLANT
MANIFOLD NEPTUNE II (INSTRUMENTS) ×1 IMPLANT
NS IRRIG 1000ML POUR BTL (IV SOLUTION) ×1 IMPLANT
PACK ORTHO EXTREMITY (CUSTOM PROCEDURE TRAY) ×1 IMPLANT
PAD ARMBOARD 7.5X6 YLW CONV (MISCELLANEOUS) ×1 IMPLANT
PREVENA RESTOR ARTHOFORM 46X30 (CANNISTER) ×1 IMPLANT
PREVENA RESTOR AXIOFORM 29X28 (GAUZE/BANDAGES/DRESSINGS) IMPLANT
SPONGE T-LAP 18X18 ~~LOC~~+RFID (SPONGE) IMPLANT
STAPLER VISISTAT 35W (STAPLE) IMPLANT
STOCKINETTE IMPERVIOUS LG (DRAPES) ×1 IMPLANT
SUT ETHILON 2 0 PSLX (SUTURE) IMPLANT
SUT SILK 2 0 (SUTURE) ×1
SUT SILK 2-0 18XBRD TIE 12 (SUTURE) ×1 IMPLANT
SUT VIC AB 1 CT1 27 (SUTURE) ×4
SUT VIC AB 1 CT1 27XBRD ANBCTR (SUTURE) IMPLANT
SUT VIC AB 1 CTX 27 (SUTURE) ×2 IMPLANT
TOWEL GREEN STERILE (TOWEL DISPOSABLE) ×1 IMPLANT
TUBE CONNECTING 12X1/4 (SUCTIONS) ×1 IMPLANT
YANKAUER SUCT BULB TIP NO VENT (SUCTIONS) ×1 IMPLANT

## 2022-10-24 NOTE — Plan of Care (Signed)
  Problem: Clinical Measurements: Goal: Ability to maintain clinical measurements within normal limits will improve Outcome: Progressing Goal: Will remain free from infection Outcome: Progressing Goal: Respiratory complications will improve Outcome: Progressing Goal: Cardiovascular complication will be avoided Outcome: Progressing   Problem: Activity: Goal: Risk for activity intolerance will decrease Outcome: Progressing   Problem: Nutrition: Goal: Adequate nutrition will be maintained Outcome: Progressing   

## 2022-10-24 NOTE — Progress Notes (Signed)
Nutrition Follow-up  DOCUMENTATION CODES:   Not applicable  INTERVENTION:  Continue tube feeding via Cortrak. Resume at goal after OR: Vital 1.5 at 60 ml/h (1440 ml per day) Prosource TF20 60 ml 1x/d Provides 2240 kcal, 117 gm protein, 1100 ml free water daily Banatrol BID to aid with loose stools 1 packet Juven BID, each packet provides 95 calories, 2.5 grams of protein (collagen), and 9.8 grams of carbohydrate (3 grams sugar); also contains 7 grams of L-arginine and L-glutamine, 300 mg vitamin C, 15 mg vitamin E, 1.2 mcg vitamin B-12, 9.5 mg zinc, 200 mg calcium, and 1.5 g  Calcium Beta-hydroxy-Beta-methylbutyrate to support wound healing MVI with minerals daily Vitamin C to 500mg  1x/d. Stop after 30 doses. No signs of deficiency per serum levels (previously ordered by MD) Advance diet as tolerated, post -surgery.   If pt's diet gets advanced, will need to confirm that she is meeting her energy needs prior to removing Cortrak tube.   NUTRITION DIAGNOSIS:   Increased nutrient needs related to wound healing as evidenced by estimated needs.  GOAL:   Patient will meet greater than or equal to 90% of their needs  MONITOR:   PO intake, Supplement acceptance  REASON FOR ASSESSMENT:   Consult Enteral/tube feeding initiation and management  ASSESSMENT:   32 y.o. female admits related to leg swelling. PMH includes: acute H. Pylori gastric ulcer, DM, gastroparesis, DKA, GERD, HTN, hyperthyroidism, normocytic anemia. Pt is currently receiving medical management related to sepsis due to cellulitis.  Meds reviewed: Vit C, folic acid, sliding scale insulin, reglan, MVI, Juven BID. Labs reviewed: Na low, BUN/Creatinine elevated.   The pt was out of room in OR for bilateral BKA at time of assessment. Pt was planned to have surgery on 10/22/22 but it got rescheduled for today. Pt is now NPO. Per flowsheets, cortrak tube is still in place. Per I/O flowsheets, it appears that the pt  had 100%  of her breakfast and lunch yesterday. RD went through previous orders and it appears that the pt was on a full liquid diet. RD will continue to monitor POC and TF tolerance and diet advancement post surgery.   Diet Order:   Diet Order             Diet NPO time specified Except for: Sips with Meds  Diet effective midnight                   EDUCATION NEEDS:   Not appropriate for education at this time  Skin:  Skin Assessment: Skin Integrity Issues: Skin Integrity Issues:: Other (Comment) Other: Cellulitis of bilateral feet  Last BM:  9/5 - type 6  Height:   Ht Readings from Last 1 Encounters:  10/08/22 5\' 7"  (1.702 m)    Weight:   Wt Readings from Last 1 Encounters:  10/24/22 99 kg    Ideal Body Weight:  61.4 kg  BMI:  Body mass index is 34.18 kg/m.  Estimated Nutritional Needs:   Kcal:  2200-2500 kcal/d  Protein:  115-130g/d  Fluid:  >/= 2.3 L  Bethann Humble, RD, LDN, CNSC.

## 2022-10-24 NOTE — Progress Notes (Signed)
PROGRESS NOTE    Kelli Hudson  EAV:[id number] DOB: 05-21-1990 DOA: 10/03/2022 PCP: Norm Salt, PA    Brief Narrative:  32 y.o. female with medical history significant of DM1, diabetic gastroparesis with very frequent admission in the past, HTN presented w/ c/o B foot wounds, fever, and pain and informed that she has been dealing with lower extremity wound for past 2 weeks ,followed with her podiatrist in the outpatient setting at [facility] there which she recently finished a course of doxycycline which did not help her symptoms, initially had a blister that ruptured subsequently with redness streaking up her legs and noticing fever chills worsening pain in last few days.  Patient admitted for sepsis due to cellulitis/lower extremity wound, severe anemia, AKI. Podiatry consulted- Unable to complete MRI as gastric pacemaker is not MRI safe 8/21 ortho consulted -- transferred to [facility], underwent excision and debridement of BLE --extending feet to thighs. EBL from this case is not recorded but pt did receive 4 PRBC 2 FFP as well as crystalloid + colloid, as well as pressors for hemodynamic support .  Remained intubated after case. PCCM consulted for ICU management 9/6- transferred to [facility] with plans for b/l BKA on same day     Assessment and Plan: Severe sepsis due to bilateral feet abscess, necrotizing fasciitis, POA.  MRSE, sensitive Pseudomonas S/p bilateral excisional leg and thigh debridement -Pain control as ordered -Planning for bilateral BKA with Dr. Lajoyce Corners on 9/6 - follow WBC -Midodrine discontinued on 9/4 -Continue meropenem and linezolid per ID    Acute blood loss anemia due to operative blood loss -Following CBC -Transfusion goal hemoglobin 7.0   Type 1 diabetes, poorly controlled with hyperglycemia and complicated with gastropathy -Continue Levemir -Continue sliding scale insulin as per protocol, CBG goal 140-180   Acute kidney injury due to septic ATN,  improving Non-anion gap metabolic acidosis Hyponatremia, euvolemic -Following BMP, urine output -Bicarbonate stable, anion gap normal.    Severe protein calorie malnutrition -Push enteral diet as she can tolerate, she has severe gastroparesis, emesis.  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       DVT prophylaxis: SCDs Start: 10/05/22 1253 Place and maintain sequential compression device Start: 10/04/22 0825    Code Status: Full Code   Disposition Plan:  Level of care: Med-Surg Status is: Inpatient Remains inpatient appropriate    Consultants:  PCCM podiatry   Subjective: No SOB, no CP  Objective: Vitals:   10/23/22 2348 10/24/22 0429 10/24/22 0500 10/24/22 0748  BP: 124/67 128/65  (!) 144/87  Pulse:  (!) 111    Resp: 18 18  16   Temp: 98.8 F (37.1 C) 98.3 F (36.8 C)  98.2 F (36.8 C)  TempSrc: Oral Oral    SpO2: 100% 100%  99%  Weight:   99 kg   Height:        Intake/Output Summary (Last 24 hours) at 10/24/2022 1007 Last data filed at 10/24/2022 0438 Gross per 24 hour  Intake 480 ml  Output 2170 ml  Net -1690 ml   Filed Weights   10/21/22 0500 10/22/22 0500 10/24/22 0500  Weight: 99.1 kg 99 kg 99 kg  Examination:   General: Appearance:    Obese female in no acute distress     Lungs:     respirations unlabored  Heart:    Tachycardic.    MS:   Legs wrapped    Neurologic:   Awake, alert       Data Reviewed: I have personally reviewed following labs and imaging studies  CBC: Recent Labs  Lab 10/18/22 0323 10/18/22 1759 10/19/22 0349 10/20/22 0356 10/21/22 0246 10/21/22 1827 10/22/22 0511  10/23/22 0415 10/24/22 0327  WBC 49.5*   < > 54.3* 53.3* 49.6*  --  44.2* 43.9* 33.8*  NEUTROABS 46.0*  --  44.7* 43.9*  --   --   --   --   --   HGB 8.1*   < > 8.0* 7.3* 7.0* 10.3* 9.6* 10.2* 8.7*  HCT 24.2*   < > 23.5* 21.9* 21.2* 30.6* 28.8* 31.0* 26.9*  MCV 86.7   < > 87.7 90.1 90.2  --  89.2 90.6 93.1  PLT 354   < > 387 474* 551*  --  606* 650* 546*   < > = values in this interval not displayed.   Basic Metabolic Panel: Recent Labs  Lab 10/19/22 0349 10/20/22 0356 10/21/22 0246 10/22/22 0511 10/23/22 0415 10/24/22 0327  NA 134* 133* 139 141 139 132*  K 4.1 4.1 4.6 4.2 4.2 4.6  CL 108 110 110 115* 113* 108  CO2 19* 18* 18* 18* 20* 19*  GLUCOSE 169* 125* 190* 124* 123* 152*  BUN 82* 86* 89* 87* 84* 80*  CREATININE 2.42* 2.54* 2.38* 2.16* 2.04* 1.82*  CALCIUM 8.0* 8.0* 8.3* 8.4* 8.7* 7.9*  MG 2.1 2.0 2.1  --  2.1 1.9  PHOS 3.6  --  3.6  --   --  3.5   GFR: Estimated Creatinine Clearance: 54.2 mL/min (A) (by C-G formula based on SCr of 1.82 mg/dL (H)). Liver Function Tests: No results for input(s): "AST", "ALT", "ALKPHOS", "BILITOT", "PROT", "ALBUMIN" in the last 168 hours. No results for input(s): "LIPASE", "AMYLASE" in the last 168 hours. No results for input(s): "AMMONIA" in the last 168 hours. Coagulation Profile: Recent Labs  Lab 10/22/22 0511  INR 1.6*   Cardiac Enzymes: No results for input(s): "CKTOTAL", "CKMB", "CKMBINDEX", "TROPONINI" in the last 168 hours. BNP (last 3 results) No results for input(s): "PROBNP" in the last 8760 hours. HbA1C: No results for input(s): "HGBA1C" in the last 72 hours. CBG: Recent Labs  Lab 10/23/22 1547 10/23/22 2000 10/23/22 2346 10/24/22 0430 10/24/22 0745  GLUCAP 214* 204* 136* 143* 120*   Lipid Profile: No results for input(s): "CHOL", "HDL", "LDLCALC", "TRIG", "CHOLHDL", "LDLDIRECT" in the last 72 hours. Thyroid Function Tests: No results for input(s): "TSH", "T4TOTAL", "FREET4", "T3FREE", "THYROIDAB" in the  last 72 hours. Anemia Panel: No results for input(s): "VITAMINB12", "FOLATE", "FERRITIN", "TIBC", "IRON", "RETICCTPCT" in the last 72 hours. Sepsis Labs: No results for input(s): "PROCALCITON", "LATICACIDVEN" in the last 168 hours.  Recent Results (from the past 240 hour(s))  Culture, blood (Routine X 2) w Reflex to ID Panel     Status: None   Collection Time: 10/16/22  2:53 AM   Specimen: BLOOD LEFT ARM  Result Value Ref Range Status   Specimen Description BLOOD LEFT ARM  Final   Special Requests   Final    BOTTLES DRAWN AEROBIC ONLY Blood Culture adequate volume   Culture   Final    NO GROWTH 5 DAYS Performed at [facility], [address] [place], [place] [address]    Report Status 10/21/2022 FINAL  Final  Culture, blood (Routine X 2) w Reflex to ID Panel     Status: None   Collection Time: 10/16/22  2:56 AM   Specimen: BLOOD LEFT ARM  Result Value Ref Range Status   Specimen Description BLOOD LEFT ARM  Final   Special Requests   Final    BOTTLES DRAWN AEROBIC ONLY Blood Culture results may not be optimal due to an excessive volume of blood received in culture bottles   Culture   Final    NO GROWTH 5 DAYS Performed at [facility], [address], [place], [place] [address]    Report Status 10/21/2022 FINAL  Final  C Difficile Quick Screen (NO PCR Reflex)     Status: None   Collection Time: 10/17/22  1:22 PM   Specimen: STOOL  Result Value Ref Range Status   C Diff antigen NEGATIVE NEGATIVE Final   C Diff toxin NEGATIVE NEGATIVE Final   C Diff interpretation No C. difficile detected.  Final    Comment: Performed at [facility], [address], [place], [place] [address]  Surgical pcr screen     Status: None   Collection Time: 10/23/22  9:05 PM   Specimen: Nasal Mucosa; Nasal Swab  Result Value Ref Range Status   MRSA, PCR NEGATIVE NEGATIVE Final   Staphylococcus aureus NEGATIVE NEGATIVE Final    Comment: (NOTE) The Xpert SA Assay (FDA approved for  NASAL specimens in patients 64 years of age and older), is one component of a comprehensive surveillance program. It is not intended to diagnose infection nor to guide or monitor treatment. Performed at [facility], [address] [place], [place] [address]          Radiology Studies: No results found.      Scheduled Meds:  acetaminophen  1,000 mg Per Tube Q6H   vitamin C  500 mg Per Tube Daily   chlorhexidine  60 mL Topical Once   Chlorhexidine Gluconate Cloth  6 each Topical Daily   DULoxetine  30 mg Oral Daily   feeding supplement (PROSource TF20)  60 mL Per Tube Daily   fiber supplement (BANATROL TF)  60 mL Per Tube BID   folic acid  1 mg Per Tube Daily   gabapentin  300 mg Per Tube Q8H   insulin aspart  0-15 Units Subcutaneous Q4H   linezolid  600 mg Per Tube Q12H   methocarbamol  1,000 mg Per Tube TID   metoCLOPramide  10 mg Per Tube Q8H   multivitamin with minerals  1 tablet Per Tube Daily   nutrition supplement (JUVEN)  1 packet Per Tube BID BM   mouth rinse  15 mL Mouth Rinse 4 times per day   pantoprazole (PROTONIX) IV  40 mg Intravenous Q12H   povidone-iodine  2 Application Topical Once   silver nitrate applicators  1 Application Topical Once   sodium chloride flush  10-40 mL Intracatheter Q12H   tranexamic acid (CYKLOKAPRON) 2,000 mg in sodium chloride 0.9 % 50 mL Topical Application  2,000 mg Topical To OR   Continuous Infusions:  sodium chloride 10 mL/hr at 10/23/22 1200    ceFAZolin (ANCEF) IV     dextrose 10 mL/hr at 10/23/22 2040   feeding supplement (VITAL 1.5 CAL) Stopped (10/21/22 1328)   meropenem (MERREM) IV 1 g (10/23/22 2042)   tranexamic acid  LOS: 21 days    Time spent: 45 minutes spent on chart review, discussion with nursing staff, consultants, updating family and interview/physical exam; more than 50% of that time was spent in counseling and/or coordination of care.    Joseph Art, DO [facility] Available via Epic secure chat 7am-7pm After these hours, please refer to coverage provider listed on [email or web address] 10/24/2022, 10:07 AM

## 2022-10-24 NOTE — Op Note (Signed)
10/24/2022  2:26 PM  PATIENT:  Kelli Hudson    PRE-OPERATIVE DIAGNOSIS:  Gangrene Bilateral Feet  POST-OPERATIVE DIAGNOSIS:  Same  PROCEDURE:  BILATERAL BELOW KNEE AMPUTATION,  APPLICATION OF WOUND VAC x 2, APPLICATION OF KERECIS 95 SQ CM FISH SKIN Application of Prevena customizable and Prevena arthroform wound VAC dressings Application of Vive Wear stump shrinker Aspiration left knee.  Fluid from left knee sent for cultures.  SURGEON:  Nadara Mustard, MD  ANESTHESIA:   General  PREOPERATIVE INDICATIONS:  Kelli Hudson is a  32 y.o. female with a diagnosis of Gangrene Bilateral Feet who failed conservative measures and elected for surgical management.    The risks benefits and alternatives were discussed with the patient preoperatively including but not limited to the risks of infection, bleeding, nerve injury, cardiopulmonary complications, the need for revision surgery, among others, and the patient was willing to proceed.  OPERATIVE IMPLANTS:   Implant Name Type Inv. Item Serial No. Manufacturer Lot No. LRB No. Used Action  GRAFT SKIN WND SURGICLOSE M95 - [id number] Tissue GRAFT SKIN WND SURGICLOSE M95  [employer] [id number] Bilateral 1 Implanted     OPERATIVE FINDINGS: Patient had an effusion of the left knee.  The knee was aspirated from the superior lateral portal.  The fluid was clear serosanguineous this was sent for cultures.  No clinical indication of infection.  Tissue margins were clear with healthy viable muscle.  OPERATIVE PROCEDURE: Patient was brought to the operating room after undergoing a regional anesthetic.  After adequate levels anesthesia were obtained both lower extremity were prepped using DuraPrep draped into a sterile field. The feet were draped out of the sterile field with impervious stockinette.  Attention was first focused on the left lower extremity.    Attention was then focused on the right lower extremity.  A transverse  skin incision was made 12 cm distal to the tibial tubercle, the incision curved proximally, and a large posterior flap was created.  The tibia was transected just proximal to the skin incision and beveled anteriorly.  The fibula was transected just proximal to the tibial incision.  The sciatic nerve was pulled cut and allowed to retract.  The vascular bundles were suture ligated with 2-0 silk.   The Kerecis micro powder was applied to the open wound that has a 200 cm surface area.   The deep and superficial fascial layers were closed using #1 Vicryl.  The skin was closed using staples.    The Prevena customizable dressing was applied this was overwrapped with the arthroform sponge.  [name] was used to secure the sponges and the circumferential compression was secured to the skin with Dermatac.  This was connected to the wound VAC pump and had a good suction fit.  This was covered with a stump shrinker.  Patient was extubated taken the ICU in stable condition.    Patient received 1 unit packed red blood cells.

## 2022-10-24 NOTE — Consult Note (Signed)
Answering service called to address over saturated island dressing at right inner thigh @1936 . Operator requested to contact Lajoyce Corners, MD for further assessment prior to intervention.

## 2022-10-24 NOTE — Progress Notes (Addendum)
PHARMACY NOTE:  ANTIMICROBIAL RENAL DOSAGE ADJUSTMENT  Current antimicrobial regimen includes a mismatch between antimicrobial dosage and estimated renal function.  As per policy approved by the Pharmacy & Therapeutics and Medical Executive Committees, the antimicrobial dosage will be adjusted accordingly.  Current antimicrobial dosage:  meropenem 1g IV q 12h  Indication: necrotizing fasciitis pending surgical intervention today (9/6)  Renal Function:  Estimated Creatinine Clearance: 54.2 mL/min (A) (by C-G formula based on SCr of 1.82 mg/dL (H)). []      On intermittent HD, scheduled: []      On CRRT    Antimicrobial dosage has been changed to:  meropenem 1g IV q 8h upon improvement in renal func to CrCl > 26ml/min   Thank you for allowing pharmacy to be a part of this patient's care.  Calton Dach, PharmD, BCCCP Clinical Pharmacist 10/24/2022 10:40 AM

## 2022-10-24 NOTE — Anesthesia Postprocedure Evaluation (Signed)
Anesthesia Post Note  Patient: Network engineer  Procedure(s) Performed: BILATERAL BELOW KNEE AMPUTATION (Bilateral: Leg Lower) APPLICATION OF WOUND VAC (Bilateral: Leg Lower) APPLICATION OF KERECIS 95 SQ CM FISH SKIN (Bilateral: Leg Lower)     Patient location during evaluation: PACU Anesthesia Type: General Level of consciousness: awake and alert Pain management: pain level controlled Vital Signs Assessment: post-procedure vital signs reviewed and stable Respiratory status: spontaneous breathing, nonlabored ventilation, respiratory function stable and patient connected to nasal cannula oxygen Cardiovascular status: blood pressure returned to baseline and stable Postop Assessment: no apparent nausea or vomiting Anesthetic complications: no   There were no known notable events for this encounter.  Last Vitals:  Vitals:   10/24/22 1515 10/24/22 1551  BP: 113/73 (!) 145/81  Pulse: (!) 113 (!) 108  Resp: 10   Temp: 36.7 C 36.7 C  SpO2: 96% 99%                  Kelli Hudson

## 2022-10-24 NOTE — Interval H&P Note (Signed)
History and Physical Interval Note:  10/24/2022 6:56 AM  Kelli Hudson  has presented today for surgery, with the diagnosis of Gangrene Bilateral Feet.  The various methods of treatment have been discussed with the patient and family. After consideration of risks, benefits and other options for treatment, the patient has consented to  Procedure(s): BILATERAL BELOW KNEE AMPUTATION (Bilateral) as a surgical intervention.  The patient's history has been reviewed, patient examined, no change in status, stable for surgery.  I have reviewed the patient's chart and labs.  Questions were answered to the patient's satisfaction.     Nadara Mustard

## 2022-10-24 NOTE — Transfer of Care (Signed)
Immediate Anesthesia Transfer of Care Note  Patient: Kelli Hudson  Procedure(s) Performed: BILATERAL BELOW KNEE AMPUTATION (Bilateral: Leg Lower) APPLICATION OF WOUND VAC (Bilateral: Leg Lower) APPLICATION OF KERECIS 95 SQ CM FISH SKIN (Bilateral: Leg Lower)  Patient Location: PACU  Anesthesia Type:General  Level of Consciousness: drowsy  Airway & Oxygen Therapy: Patient connected to face mask oxygen  Post-op Assessment: Report given to RN and Post -op Vital signs reviewed and stable  Post vital signs: Reviewed and stable  Last Vitals:  Vitals Value Taken Time  BP 138/89   Temp    Pulse 109   Resp 12   SpO2 100     Last Pain:  Vitals:   10/24/22 1129  TempSrc: Oral  PainSc: 0-No pain      Patients Stated Pain Goal: 0 (10/24/22 1030)  Complications: There were no known notable events for this encounter.

## 2022-10-24 NOTE — Anesthesia Procedure Notes (Addendum)
Procedure Name: Intubation Date/Time: 10/24/2022 1:03 PM  Performed by: Sharyn Dross, CRNAPre-anesthesia Checklist: Patient identified, Emergency Drugs available, Suction available and Patient being monitored Patient Re-evaluated:Patient Re-evaluated prior to induction Oxygen Delivery Method: Circle system utilized Preoxygenation: Pre-oxygenation with 100% oxygen Induction Type: IV induction Ventilation: Mask ventilation without difficulty Laryngoscope Size: Mac and 4 Grade View: Grade II Tube type: Oral Tube size: 7.0 mm Number of attempts: 1 Airway Equipment and Method: Stylet and Oral airway Placement Confirmation: ETT inserted through vocal cords under direct vision, positive ETCO2 and breath sounds checked- equal and bilateral Secured at: 22 cm Tube secured with: Tape Dental Injury: Teeth and Oropharynx as per pre-operative assessment

## 2022-10-25 DIAGNOSIS — M726 Necrotizing fasciitis: Secondary | ICD-10-CM | POA: Diagnosis not present

## 2022-10-25 LAB — BPAM RBC
Blood Product Expiration Date: 202409262359
Blood Product Expiration Date: 202409262359
Blood Product Expiration Date: 202410032359
Blood Product Expiration Date: 202410042359
ISSUE DATE / TIME: 202409031133
ISSUE DATE / TIME: 202409031423
ISSUE DATE / TIME: 202409061405
ISSUE DATE / TIME: 202409062147
Unit Type and Rh: 5100
Unit Type and Rh: 5100
Unit Type and Rh: 5100
Unit Type and Rh: 5100

## 2022-10-25 LAB — TYPE AND SCREEN
ABO/RH(D): O POS
Antibody Screen: NEGATIVE
Unit division: 0
Unit division: 0
Unit division: 0
Unit division: 0

## 2022-10-25 LAB — CBC
HCT: 24.3 % — ABNORMAL LOW (ref 36.0–46.0)
Hemoglobin: 7.9 g/dL — ABNORMAL LOW (ref 12.0–15.0)
MCH: 30.2 pg (ref 26.0–34.0)
MCHC: 32.5 g/dL (ref 30.0–36.0)
MCV: 92.7 fL (ref 80.0–100.0)
Platelets: 394 10*3/uL (ref 150–400)
RBC: 2.62 MIL/uL — ABNORMAL LOW (ref 3.87–5.11)
RDW: 16.1 % — ABNORMAL HIGH (ref 11.5–15.5)
WBC: 27.6 10*3/uL — ABNORMAL HIGH (ref 4.0–10.5)
nRBC: 0.4 % — ABNORMAL HIGH (ref 0.0–0.2)

## 2022-10-25 LAB — GLUCOSE, CAPILLARY
Glucose-Capillary: 73 mg/dL (ref 70–99)
Glucose-Capillary: 52 mg/dL — ABNORMAL LOW (ref 70–99)
Glucose-Capillary: 141 mg/dL — ABNORMAL HIGH (ref 70–99)
Glucose-Capillary: 181 mg/dL — ABNORMAL HIGH (ref 70–99)
Glucose-Capillary: 153 mg/dL — ABNORMAL HIGH (ref 70–99)
Glucose-Capillary: 184 mg/dL — ABNORMAL HIGH (ref 70–99)

## 2022-10-25 LAB — BASIC METABOLIC PANEL
Anion gap: 5 (ref 5–15)
BUN: 68 mg/dL — ABNORMAL HIGH (ref 6–20)
CO2: 19 mmol/L — ABNORMAL LOW (ref 22–32)
Calcium: 7.3 mg/dL — ABNORMAL LOW (ref 8.9–10.3)
Chloride: 106 mmol/L (ref 98–111)
Creatinine, Ser: 1.63 mg/dL — ABNORMAL HIGH (ref 0.44–1.00)
GFR, Estimated: 43 mL/min — ABNORMAL LOW (ref >60)
Glucose, Bld: 92 mg/dL (ref 70–99)
Potassium: 4.4 mmol/L (ref 3.5–5.1)
Sodium: 130 mmol/L — ABNORMAL LOW (ref 135–145)

## 2022-10-25 MED ORDER — ALUM & MAG HYDROXIDE-SIMETH 200-200-20 MG/5ML PO SUSP: 15.0000 mL | ORAL | Status: DC | PRN

## 2022-10-25 MED ORDER — JUVEN PO PACK
1.0000 | PACK | Freq: Two times a day (BID) | ORAL | Status: DC
  Filled 2022-10-25: qty 1

## 2022-10-25 MED ORDER — POTASSIUM CHLORIDE 20 MEQ PO PACK: 20.0000 meq | PACK | Freq: Every day | ORAL | Status: DC | PRN

## 2022-10-25 MED ORDER — VITAMIN C 500 MG PO TABS
1000.0000 mg | ORAL_TABLET | Freq: Every day | ORAL | Status: DC
  Administered 2022-10-26: 1000 mg
  Filled 2022-10-25: qty 2

## 2022-10-25 MED ORDER — MAGNESIUM CITRATE PO SOLN: 1.0000 | Freq: Once | ORAL | Status: DC | PRN

## 2022-10-25 MED ORDER — FUROSEMIDE 40 MG PO TABS
40.0000 mg | ORAL_TABLET | Freq: Every day | ORAL | Status: DC
  Administered 2022-10-26: 40 mg
  Filled 2022-10-25: qty 1

## 2022-10-25 MED ORDER — POTASSIUM CHLORIDE CRYS ER 20 MEQ PO TBCR: 20.0000 meq | EXTENDED_RELEASE_TABLET | Freq: Every day | ORAL | Status: DC | PRN

## 2022-10-25 MED ORDER — OMEPRAZOLE 2 MG/ML ORAL SUSPENSION
40.0000 mg | Freq: Every day | ORAL | Status: DC
  Filled 2022-10-25: qty 20

## 2022-10-25 MED ORDER — PANTOPRAZOLE 2 MG/ML SUSPENSION
40.0000 mg | Freq: Every day | ORAL | Status: DC
Start: 2022-10-26 — End: 2022-10-25

## 2022-10-25 MED ORDER — DOCUSATE SODIUM 50 MG/5ML PO LIQD
100.0000 mg | Freq: Every day | ORAL | Status: DC
  Filled 2022-10-25 (×2): qty 10

## 2022-10-25 MED ORDER — FUROSEMIDE 40 MG PO TABS
40.0000 mg | ORAL_TABLET | Freq: Every day | ORAL | Status: DC
  Administered 2022-10-25: 40 mg via ORAL
  Filled 2022-10-25: qty 1

## 2022-10-25 MED ORDER — POLYETHYLENE GLYCOL 3350 17 G PO PACK: 17.0000 g | PACK | Freq: Every day | ORAL | Status: DC | PRN

## 2022-10-25 MED ORDER — ZINC SULFATE 220 (50 ZN) MG PO CAPS
220.0000 mg | ORAL_CAPSULE | Freq: Every day | ORAL | Status: DC
  Administered 2022-10-26: 220 mg
  Filled 2022-10-25: qty 1

## 2022-10-25 NOTE — Progress Notes (Signed)
Inpatient Rehab Admissions Coordinator:  Consult received. Await therapy evaluations and recommendations to help determine appropriate rehab venue.  Gayland Curry, [place], [place] Admissions Coordinator [phone]

## 2022-10-25 NOTE — Progress Notes (Signed)
Patient ID: Kelli Hudson, female   DOB: 1991-02-11, 32 y.o.   MRN: [id number] Patient is postoperative day 1 bilateral transtibial amputations.  Hemoglobin stable at 8.7.  White cell count has dropped to 33,000.  Patient states she does feel little better.  There is 100 cc in one of the wound VAC canisters the other has no drainage.  No further surgical intervention anticipated at this time.  Patient may advance to activities and diet as tolerated.

## 2022-10-25 NOTE — Progress Notes (Signed)
Inpatient Rehab Admissions:  Inpatient Rehab Consult received.  I met with patient at the bedside for rehabilitation assessment and to discuss goals and expectations of an inpatient rehab admission.  Discussed average length of stay, insurance authorization requirement, discharge home after completion of CIR. Pt acknowledged understanding. Pt interested in pursuing CIR. Pt gave permission to contact mother [name]. Spoke to [place] on the telephone. She also acknowledged understanding of CIR goals and expectations. Also discussed the difference in therapy options. She is supportive of pt pursuing CIR. She confirmed that pt will have 24/7 support after discharge. Will continue to follow.  Signed: Wolfgang Phoenix, MS, CCC-SLP Admissions Coordinator [phone]

## 2022-10-25 NOTE — Evaluation (Signed)
Physical Therapy Evaluation Patient Details Name: Kelli Hudson MRN: [id number] DOB: 30-Jan-1991 Today's Date: 10/25/2022  History of Present Illness  Patient is 32 y.o. female presented to hospital 10/03/22 for bil foot wounds, fever, and pain for ~2 weeks with no improvement with antibiotics. Pt admitted for sepsis with necrotizing fasciitis/gangrene bilateral feet elected for surgical management. Pt underwent excision and debridement of BLE extending feet to thighs on 10/08/22 and remained intubated in ICU. 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 PMH significant for DM, HTN, gastric pacemaker.   Clinical Impression  Kelli Hudson is a 32 y.o. female POD 1 s/p bil BKA. Patient reports PTA independence with mobility and ADL's at baseline. Patient is now limited by functional impairments (see PT problem list below) and requires Mod-Max +2 for bed mobility and bed>chair transfers. Patient was able to complete lateral scoot transfers with +2 assist and use of bed pad. As pt performed UE use improved with cues for hand placement and technique. Patient educated initial process of amputation recovery and goals of mod ind wheelchair user while rehabbing prior to prosthetic. Patient will benefit from continued skilled PT interventions to address impairments and progress towards PLOF. Acute PT will follow to progress mobility as able. Patient will benefit from intensive inpatient follow up therapy, >3 hours/day.         If plan is discharge home, recommend the following: Two people to help with walking and/or transfers;Two people to help with bathing/dressing/bathroom;Assistance with cooking/housework;Assist for transportation;Help with stairs or ramp for entrance;Supervision due to cognitive status   Can travel by private vehicle        Equipment Recommendations Wheelchair (measurements PT);Wheelchair cushion (measurements PT);Other (comment) (TBD at next venue)   Recommendations for Other Services  Rehab consult    Functional Status Assessment Patient has had a recent decline in their functional status and demonstrates the ability to make significant improvements in function in a reasonable and predictable amount of time.     Precautions / Restrictions Precautions Precautions: Fall Precaution Comments: bil wound vac, NGT, foley Restrictions Weight Bearing Restrictions: Yes RLE Weight Bearing: Non weight bearing LLE Weight Bearing: Non weight bearing Other Position/Activity Restrictions: bil BKA      Mobility  Bed Mobility Overal bed mobility: Needs Assistance Bed Mobility: Supine to Sit     Supine to sit: Mod assist, Max assist, +2 for safety/equipment, Used rails, HOB elevated     General bed mobility comments: step by step cues provided for reaching to bed rail and mod-max +2 assist to pivot with use of bed pad to scoto hips/LE's towards EOB. Pt able to pull trunk up with mod assist and use of rail.    Transfers Overall transfer level: Needs assistance   Transfers: Bed to chair/wheelchair/BSC            Lateral/Scoot Transfers: Max assist, +2 safety/equipment, +2 physical assistance General transfer comment: Max+2 with use of bed pad for lateral scoot bed>chair. cues for head:hip relationship to facilitate transfer.    Ambulation/Gait                  Stairs            Wheelchair Mobility     Tilt Bed    Modified Rankin (Stroke Patients Only)       Balance Overall balance assessment: Needs assistance Sitting-balance support: Feet unsupported, Bilateral upper extremity supported Sitting balance-Leahy Scale: Fair Sitting balance - Comments: reliant on support at start for  static balance. fading to CGA.                                     Pertinent Vitals/Pain Pain Assessment Pain Assessment: 0-10 Pain Score: 10-Worst pain ever Pain Location: bil LE Pain Descriptors /  Indicators: Aching, Operative site guarding, Sore Pain Intervention(s): Limited activity within patient's tolerance, Monitored during session, Repositioned, Patient requesting pain meds-RN notified    Home Living Family/patient expects to be discharged to:: Private residence Living Arrangements: Parent (plan to live with mom at [place]) Available Help at Discharge: Family;Available PRN/intermittently Type of Home: House Home Access: Stairs to enter Entrance Stairs-Rails: Right;Can reach both;Left Entrance Stairs-Number of Steps: 2 steps Alternate Level Stairs-Number of Steps: 13 steps Home Layout: Two level (town home) Home Equipment: Crutches Additional Comments: mom works during the day. Pt used to work at the [employer].    Prior Function Prior Level of Function : Independent/Modified Independent                     Extremity/Trunk Assessment   Upper Extremity Assessment Upper Extremity Assessment: Defer to OT evaluation    Lower Extremity Assessment Lower Extremity Assessment: RLE deficits/detail;LLE deficits/detail RLE Deficits / Details: wound vac in place bil, ROM limited from 0-50* for bil knees. pt limited by pain. quad activation noted but limited by pain and pt able to abd/add hips. LLE Deficits / Details: wound vac in place bil, ROM limited from 0-50* for bil knees. pt limited by pain. quad activation noted but limited by pain and pt able to abd/add hips.    Cervical / Trunk Assessment Cervical / Trunk Assessment: Other exceptions Cervical / Trunk Exceptions: habitus  Communication   Communication Communication: No apparent difficulties Cueing Techniques: Verbal cues;Tactile cues;Visual cues  Cognition Arousal: Alert Behavior During Therapy: Flat affect Overall Cognitive Status: Impaired/Different from baseline                                          General Comments      Exercises     Assessment/Plan    PT Assessment Patient  needs continued PT services  PT Problem List Decreased strength;Decreased range of motion;Decreased activity tolerance;Decreased balance;Decreased mobility;Decreased coordination;Decreased cognition;Decreased knowledge of use of DME;Decreased safety awareness;Decreased knowledge of precautions;Impaired sensation;Impaired tone;Obesity;Pain;Decreased skin integrity;Cardiopulmonary status limiting activity       PT Treatment Interventions DME instruction;Gait training;Stair training;Functional mobility training;Therapeutic activities;Therapeutic exercise;Balance training;Neuromuscular re-education;Cognitive remediation;Patient/family education;Wheelchair mobility training;Manual techniques    PT Goals (Current goals can be found in the Care Plan section)  Acute Rehab PT Goals Patient Stated Goal: regain independence and ability to walk with prosthetics PT Goal Formulation: With patient Time For Goal Achievement: 11/08/22 Potential to Achieve Goals: Good    Frequency Min 1X/week     Co-evaluation               AM-PAC PT "6 Clicks" Mobility  Outcome Measure Help needed turning from your back to your side while in a flat bed without using bedrails?: A Lot Help needed moving from lying on your back to sitting on the side of a flat bed without using bedrails?: Total Help needed moving to and from a bed to a chair (including a wheelchair)?: Total Help needed standing up from a chair using your arms (  e.g., wheelchair or bedside chair)?: Total Help needed to walk in hospital room?: Total Help needed climbing 3-5 steps with a railing? : Total 6 Click Score: 7    End of Session   Activity Tolerance: Patient tolerated treatment well Patient left: in chair;with call bell/phone within reach;with chair alarm set;with family/visitor present Nurse Communication: Mobility status;Need for lift equipment (2+) PT Visit Diagnosis: Muscle weakness (generalized) (M62.81);Difficulty in walking, not  elsewhere classified (R26.2);Other symptoms and signs involving the nervous system (R29.898);Other abnormalities of gait and mobility (R26.89);Unsteadiness on feet (R26.81);Pain Pain - Right/Left:  (bil) Pain - part of body: Leg    Time: 1308-6578 PT Time Calculation (min) (ACUTE ONLY): 36 min   Charges:   PT Evaluation $PT Eval Moderate Complexity: 1 Mod   PT General Charges $$ ACUTE PT VISIT: 1 Visit         Wynn Maudlin, DPT Acute Rehabilitation Services Office [phone]  10/25/22 1:59 PM

## 2022-10-25 NOTE — Progress Notes (Signed)
PROGRESS NOTE    Kelli Hudson  ZOX:[id number] DOB: [birth year] DOA: 10/03/2022 PCP: Norm Salt, PA   Brief Narrative:  32 y.o. female with medical history significant of DM1, diabetic gastroparesis with very frequent admission in the past, HTN presented w/ c/o B foot wounds, fever, and pain and informed that she has been dealing with lower extremity wound for past 2 weeks ,followed with her podiatrist in the outpatient setting at [facility] there which she recently finished a course of doxycycline which did not help her symptoms, initially had a blister that ruptured subsequently with redness streaking up her legs and noticing fever chills worsening pain in last few days.  Patient admitted for sepsis due to cellulitis/lower extremity wound, severe anemia, AKI. Podiatry consulted- Unable to complete MRI as gastric pacemaker is not MRI safe 8/21 ortho consulted -- transferred to [facility], underwent excision and debridement of BLE --extending feet to thighs. EBL from this case is not recorded but pt did receive 4 PRBC 2 FFP as well as crystalloid + colloid, as well as pressors for hemodynamic support .  Remained intubated after case. PCCM consulted for ICU management 9/6- transferred to [facility] with plans for b/l BKA on same day  Assessment & Plan:   Principal Problem:   Necrotizing fasciitis (HCC) Active Problems:   AKI (acute kidney injury) (HCC)   Sepsis due to cellulitis (HCC)   Cellulitis of lower extremity   Anemia   Diabetic gastroparesis associated with type 1 diabetes mellitus (HCC)   DM type 1 (diabetes mellitus, type 1) (HCC)   Drug-seeking behavior   Sepsis (HCC)   Necrotizing fasciitis of pelvic region and thigh (HCC)   Necrotizing fasciitis of lower leg (HCC)   MDD (major depressive disorder), recurrent episode, moderate (HCC)  Severe sepsis due to bilateral feet abscess, necrotizing fasciitis, POA.  MRSE, sensitive Pseudomonas S/p bilateral excisional leg  and thigh debridement Status post BKA with Dr. Lajoyce Corners on 9/6.  She has wound VAC.  Pain control per orthopedics. -Midodrine discontinued on 9/4 -Continue meropenem and linezolid per ID.  May need discharge to CIR which has been consulted.  PT OT to assess patient.   Acute blood loss anemia due to operative blood loss -underwent excision and debridement of BLE --extending feet to thighs. EBL from this case is not recorded but pt did receive 4 PRBC 2 FFP -Transfusion goal hemoglobin 7.0   Type 1 diabetes, poorly controlled with hyperglycemia and complicated with gastropathy -On SSI only and blood sugar now controlled. -Continue sliding scale insulin as per protocol, CBG goal 140-180   Acute kidney injury on CKD stage IIIa due to septic ATN, improving Non-anion gap metabolic acidosis Hyponatremia, euvolemic -Baseline creatinine appears to be around 1.4.  Creatinine peaked at 2.54 but improving and 1.63 today.  Follow daily.   Severe protein calorie malnutrition Patient is getting tube feeds through [facility].  Mother is asking when the tube will come off as patient has started eating 100% of the meals.  Patient's RN has been advised to reach out to the dietitian so they can do calorie count and make a decision on that.   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   Bilateral upper extremity edema: She  appears to have +3 pitting edema in upper extremities.  No mention of this in any notes and family unable to tell me how long she has had that edema.  She has been advised to keep her bilateral upper extremities elevated and I am going to start her on Lasix 40 mg p.o. daily.    DVT prophylaxis: SCD's Start: 10/24/22  1557 SCDs Start: 10/05/22 1253 Place and maintain sequential compression device Start: 10/04/22 0825   Code Status: Full Code  Family Communication: Mother, sister and brother present at bedside.  Plan of care discussed with patient in length and he/she verbalized understanding and agreed with it.  Status is: Inpatient Remains inpatient appropriate because: Recovering from surgery   Estimated body mass index is 34.18 kg/m as calculated from the following:   Height as of this encounter: 5\' 7"  (1.702 m).   Weight as of this encounter: 99 kg.  Pressure Injury 10/07/22 Elbow Posterior;Right Stage 2 -  Partial thickness loss of dermis presenting as a shallow open injury with a red, pink wound bed without slough. (Active)  10/07/22 1226  Location: Elbow  Location Orientation: Posterior;Right  Staging: Stage 2 -  Partial thickness loss of dermis presenting as a shallow open injury with a red, pink wound bed without slough.  Wound Description (Comments):   Present on Admission:   Dressing Type Foam - Lift dressing to assess site every shift (Mepilex) 10/24/22 1610   Nutritional Assessment: Body mass index is 34.18 kg/m.[name] Seen by dietician.  I agree with the assessment and plan as outlined below: Nutrition Status: Nutrition Problem: Increased nutrient needs Etiology: wound healing Signs/Symptoms: estimated needs Interventions: Juven, Glucerna shake  . Skin Assessment: I have examined the patient's skin and I agree with the wound assessment as performed by the wound care RN as outlined below: Pressure Injury 10/07/22 Elbow Posterior;Right Stage 2 -  Partial thickness loss of dermis presenting as a shallow open injury with a red, pink wound bed without slough. (Active)  10/07/22 1226  Location: Elbow  Location Orientation: Posterior;Right  Staging: Stage 2 -  Partial thickness loss of dermis presenting as a shallow open injury with a red, pink wound bed without slough.  Wound  Description (Comments):   Present on Admission:   Dressing Type Foam - Lift dressing to assess site every shift (Mepilex) 10/24/22 9604    Consultants:  Orthopedics  Procedures:  As above  Antimicrobials:  Anti-infectives (From admission, onward)    Start     Dose/Rate Route Frequency Ordered Stop   10/24/22 1130  ceFAZolin (ANCEF) IVPB 2g/100 mL premix        2 g 200 mL/hr over 30 Minutes Intravenous On call to O.R. 10/23/22 1954 10/24/22 1334   10/24/22 1130  meropenem (MERREM) 1 g in sodium chloride 0.9 % 100 mL IVPB        1 g 200 mL/hr over 30 Minutes Intravenous Every 8 hours 10/24/22 1039     10/24/22 0600  ceFAZolin (ANCEF) IVPB 2g/100 mL premix  Status:  Discontinued        2 g 200 mL/hr over 30 Minutes Intravenous On call to O.R. 10/23/22 1949 10/23/22 1954   10/23/22 0600  ceFAZolin (ANCEF) IVPB 2g/100 mL premix  Status:  Discontinued        2 g 200 mL/hr over 30 Minutes Intravenous On call to O.R. 10/22/22 1503 10/22/22 1607   10/22/22 1521  ceFAZolin (ANCEF) 2-4 GM/100ML-% IVPB  Status:  Discontinued  Note to Pharmacy: [facility], [place]: cabinet override      10/22/22 1521 10/22/22 1552   10/21/22 1030  linezolid (ZYVOX) tablet 600 mg       Note to Pharmacy: Can you schedule her Q12 from her last dose   600 mg Per Tube Every 12 hours 10/21/22 0931     10/19/22 2200  meropenem (MERREM) 1 g in sodium chloride 0.9 % 100 mL IVPB  Status:  Discontinued        1 g 200 mL/hr over 30 Minutes Intravenous Every 12 hours 10/19/22 1058 10/24/22 1039   10/19/22 2200  linezolid (ZYVOX) tablet 600 mg  Status:  Discontinued       Note to Pharmacy: Can you schedule her Q12 from her last dose   600 mg Oral Every 12 hours 10/19/22 1103 10/21/22 0931   10/18/22 1600  ceFEPIme (MAXIPIME) 2 g in sodium chloride 0.9 % 100 mL IVPB  Status:  Discontinued        2 g 200 mL/hr over 30 Minutes Intravenous Every 12 hours 10/17/22 1311 10/18/22 0757   10/18/22 0756  ceFEPIme (MAXIPIME) 2 g  in sodium chloride 0.9 % 100 mL IVPB  Status:  Discontinued        2 g 200 mL/hr over 30 Minutes Intravenous Every 12 hours 10/18/22 0757 10/19/22 1049   10/17/22 0847  ceFAZolin (ANCEF) 2-4 GM/100ML-% IVPB       Note to Pharmacy: Phebe Colla N: cabinet override      10/17/22 0847 10/17/22 1000   10/17/22 0830  ceFAZolin (ANCEF) IVPB 2g/100 mL premix        2 g 200 mL/hr over 30 Minutes Intravenous On call to O.R. 10/17/22 8119 10/17/22 0953   10/16/22 1200  ceFEPIme (MAXIPIME) 2 g in sodium chloride 0.9 % 100 mL IVPB  Status:  Discontinued        2 g 200 mL/hr over 30 Minutes Intravenous Every 8 hours 10/16/22 0954 10/17/22 1311   10/15/22 2200  linezolid (ZYVOX) tablet 600 mg  Status:  Discontinued        600 mg Oral Every 12 hours 10/15/22 1420 10/19/22 1049   10/15/22 2200  metroNIDAZOLE (FLAGYL) tablet 500 mg  Status:  Discontinued        500 mg Oral Every 12 hours 10/15/22 1420 10/19/22 1049   10/12/22 1500  linezolid (ZYVOX) tablet 600 mg  Status:  Discontinued        600 mg Per Tube Every 12 hours 10/12/22 1414 10/15/22 1420   10/11/22 1100  linezolid (ZYVOX) tablet 600 mg  Status:  Discontinued        600 mg Per Tube Every 12 hours 10/11/22 1008 10/11/22 1250   10/10/22 2200  metroNIDAZOLE (FLAGYL) tablet 500 mg  Status:  Discontinued        500 mg Per Tube Every 12 hours 10/10/22 0814 10/15/22 1420   10/09/22 0600  ceFAZolin (ANCEF) IVPB 2g/100 mL premix  Status:  Discontinued        2 g 200 mL/hr over 30 Minutes Intravenous On call to O.R. 10/08/22 1425 10/08/22 1615   10/08/22 1600  ceFEPIme (MAXIPIME) 2 g in sodium chloride 0.9 % 100 mL IVPB  Status:  Discontinued        2 g 200 mL/hr over 30 Minutes Intravenous Every 12 hours 10/08/22 0736 10/16/22 0954   10/07/22 2200  linezolid (ZYVOX) IVPB 600 mg  Status:  Discontinued  Note to Pharmacy: Per Dr Renold Don- will likely d/c Vanc 8/21 buts to keep both while awaiting blood cultures 8/20   600 mg 300 mL/hr over 60 Minutes  Intravenous Every 12 hours 10/07/22 1658 10/11/22 1008   10/07/22 2000  ceFEPIme (MAXIPIME) 2 g in sodium chloride 0.9 % 100 mL IVPB  Status:  Discontinued        2 g 200 mL/hr over 30 Minutes Intravenous Every 8 hours 10/07/22 1233 10/08/22 0736   10/04/22 1600  vancomycin (VANCOREADY) IVPB 1250 mg/250 mL  Status:  Discontinued        1,250 mg 166.7 mL/hr over 90 Minutes Intravenous Every 24 hours 10/03/22 2312 10/08/22 0932   10/04/22 0000  ceFEPIme (MAXIPIME) 2 g in sodium chloride 0.9 % 100 mL IVPB  Status:  Discontinued        2 g 200 mL/hr over 30 Minutes Intravenous Every 12 hours 10/03/22 2312 10/07/22 1233   10/03/22 1530  vancomycin (VANCOCIN) 500 mg in sodium chloride 0.9 % 100 mL IVPB        500 mg 100 mL/hr over 60 Minutes Intravenous  Once 10/03/22 1418 10/03/22 2020   10/03/22 1430  vancomycin (VANCOCIN) IVPB 1000 mg/200 mL premix        1,000 mg 200 mL/hr over 60 Minutes Intravenous  Once 10/03/22 1417 10/03/22 1830   10/03/22 1415  metroNIDAZOLE (FLAGYL) IVPB 500 mg        500 mg 100 mL/hr over 60 Minutes Intravenous Every 12 hours 10/03/22 1413 10/10/22 0520   10/03/22 1415  ceFEPIme (MAXIPIME) 2 g in sodium chloride 0.9 % 100 mL IVPB        2 g 200 mL/hr over 30 Minutes Intravenous  Once 10/03/22 1417 10/03/22 1530         Subjective: Patient seen and examined.  She has no complaints.  Pain is controlled.  Multiple family members at the bedside.  Objective: Vitals:   10/24/22 2216 10/24/22 2230 10/25/22 0110 10/25/22 0434  BP: 115/71 114/61 112/63 (!) 137/90  Pulse: (!) 115 (!) 116 96 (!) 117  Resp: 18 18 15 16   Temp: 98 F (36.7 C) 97.6 F (36.4 C) 98.1 F (36.7 C) 98.9 F (37.2 C)  TempSrc: Oral Oral Oral   SpO2:  100% 100% 100%  Weight:      Height:        Intake/Output Summary (Last 24 hours) at 10/25/2022 0751 Last data filed at 10/25/2022 0517 Gross per 24 hour  Intake 1675.82 ml  Output 2170 ml  Net -494.18 ml   Filed Weights   10/21/22  0500 10/22/22 0500 10/24/22 0500  Weight: 99.1 kg 99 kg 99 kg    Examination:  General exam: Appears calm and comfortable but deconditioned. Respiratory system: Clear to auscultation. Respiratory effort normal. Cardiovascular system: S1 & S2 heard, RRR. No JVD, murmurs, rubs, gallops or clicks.  +3 pitting edema bilateral upper extremities. Gastrointestinal system: Abdomen is nondistended, soft and nontender. No organomegaly or masses felt. Normal bowel sounds heard. Central nervous system: Alert and oriented. No focal neurological deficits. Extremities: Bilateral BKA with wound VAC attached. Skin: No rashes, lesions or ulcers Psychiatry: Judgement and insight appear normal. Mood & affect appropriate.    Data Reviewed: I have personally reviewed following labs and imaging studies  CBC: Recent Labs  Lab 10/19/22 0349 10/20/22 0356 10/21/22 0246 10/21/22 1827 10/22/22 0511 10/23/22 0415 10/24/22 0327  WBC 54.3* 53.3* 49.6*  --  44.2* 43.9* 33.8*  NEUTROABS 44.7* 43.9*  --   --   --   --   --   HGB 8.0* 7.3* 7.0* 10.3* 9.6* 10.2* 8.7*  HCT 23.5* 21.9* 21.2* 30.6* 28.8* 31.0* 26.9*  MCV 87.7 90.1 90.2  --  89.2 90.6 93.1  PLT 387 474* 551*  --  606* 650* 546*   Basic Metabolic Panel: Recent Labs  Lab 10/19/22 0349 10/20/22 0356 10/21/22 0246 10/22/22 0511 10/23/22 0415 10/24/22 0327 10/25/22 0344  NA 134* 133* 139 141 139 132* 130*  K 4.1 4.1 4.6 4.2 4.2 4.6 4.4  CL 108 110 110 115* 113* 108 106  CO2 19* 18* 18* 18* 20* 19* 19*  GLUCOSE 169* 125* 190* 124* 123* 152* 92  BUN 82* 86* 89* 87* 84* 80* 68*  CREATININE 2.42* 2.54* 2.38* 2.16* 2.04* 1.82* 1.63*  CALCIUM 8.0* 8.0* 8.3* 8.4* 8.7* 7.9* 7.3*  MG 2.1 2.0 2.1  --  2.1 1.9  --   PHOS 3.6  --  3.6  --   --  3.5  --    GFR: Estimated Creatinine Clearance: 60.5 mL/min (A) (by C-G formula based on SCr of 1.63 mg/dL (H)). Liver Function Tests: No results for input(s): "AST", "ALT", "ALKPHOS", "BILITOT", "PROT",  "ALBUMIN" in the last 168 hours. No results for input(s): "LIPASE", "AMYLASE" in the last 168 hours. No results for input(s): "AMMONIA" in the last 168 hours. Coagulation Profile: Recent Labs  Lab 10/22/22 0511  INR 1.6*   Cardiac Enzymes: No results for input(s): "CKTOTAL", "CKMB", "CKMBINDEX", "TROPONINI" in the last 168 hours. BNP (last 3 results) No results for input(s): "PROBNP" in the last 8760 hours. HbA1C: No results for input(s): "HGBA1C" in the last 72 hours. CBG: Recent Labs  Lab 10/24/22 1435 10/24/22 1649 10/24/22 2235 10/24/22 2351 10/25/22 0404  GLUCAP 144* 172* 206* 226* 73   Lipid Profile: No results for input(s): "CHOL", "HDL", "LDLCALC", "TRIG", "CHOLHDL", "LDLDIRECT" in the last 72 hours. Thyroid Function Tests: No results for input(s): "TSH", "T4TOTAL", "FREET4", "T3FREE", "THYROIDAB" in the last 72 hours. Anemia Panel: No results for input(s): "VITAMINB12", "FOLATE", "FERRITIN", "TIBC", "IRON", "RETICCTPCT" in the last 72 hours. Sepsis Labs: No results for input(s): "PROCALCITON", "LATICACIDVEN" in the last 168 hours.  Recent Results (from the past 240 hour(s))  Culture, blood (Routine X 2) w Reflex to ID Panel     Status: None   Collection Time: 10/16/22  2:53 AM   Specimen: BLOOD LEFT ARM  Result Value Ref Range Status   Specimen Description BLOOD LEFT ARM  Final   Special Requests   Final    BOTTLES DRAWN AEROBIC ONLY Blood Culture adequate volume   Culture   Final    NO GROWTH 5 DAYS Performed at [facility], [address], [place], [place] [address]    Report Status 10/21/2022 FINAL  Final  Culture, blood (Routine X 2) w Reflex to ID Panel     Status: None   Collection Time: 10/16/22  2:56 AM   Specimen: BLOOD LEFT ARM  Result Value Ref Range Status   Specimen Description BLOOD LEFT ARM  Final   Special Requests   Final    BOTTLES DRAWN AEROBIC ONLY Blood Culture results may not be optimal due to an excessive volume of blood  received in culture bottles   Culture   Final    NO GROWTH 5 DAYS Performed at [facility], [address], [place], [place] [address]    Report Status  10/21/2022 FINAL  Final  C Difficile Quick Screen (NO PCR Reflex)     Status: None   Collection Time: 10/17/22  1:22 PM   Specimen: STOOL  Result Value Ref Range Status   C Diff antigen NEGATIVE NEGATIVE Final   C Diff toxin NEGATIVE NEGATIVE Final   C Diff interpretation No C. difficile detected.  Final    Comment: Performed at [facility], [address] [place], [place] [address]  Surgical pcr screen     Status: None   Collection Time: 10/23/22  9:05 PM   Specimen: Nasal Mucosa; Nasal Swab  Result Value Ref Range Status   MRSA, PCR NEGATIVE NEGATIVE Final   Staphylococcus aureus NEGATIVE NEGATIVE Final    Comment: (NOTE) The Xpert SA Assay (FDA approved for NASAL specimens in patients 96 years of age and older), is one component of a comprehensive surveillance program. It is not intended to diagnose infection nor to guide or monitor treatment. Performed at [facility], [address], [place], [place] [address]   Aerobic/Anaerobic Culture w Gram Stain (surgical/deep wound)     Status: None (Preliminary result)   Collection Time: 10/24/22  2:43 PM   Specimen: Path fluid; Body Fluid  Result Value Ref Range Status   Specimen Description FLUID  Final   Special Requests right knee joint fluid  Final   Gram Stain   Final    RARE WBC PRESENT, PREDOMINANTLY PMN NO ORGANISMS SEEN Performed at [facility], [address] [place], [place] [address]    Culture PENDING  Incomplete   Report Status PENDING  Incomplete     Radiology Studies: PERIPHERAL VASCULAR CATHETERIZATION  Result Date: 10/24/2022 See surgical note for result.   Scheduled Meds:  acetaminophen  1,000 mg Per Tube Q6H   vitamin C  1,000 mg Oral Daily   Chlorhexidine Gluconate Cloth  6 each Topical Daily   docusate sodium  100 mg  Oral Daily   DULoxetine  30 mg Oral Daily   feeding supplement (PROSource TF20)  60 mL Per Tube Daily   fiber supplement (BANATROL TF)  60 mL Per Tube BID   folic acid  1 mg Per Tube Daily   gabapentin  300 mg Per Tube Q8H   insulin aspart  0-15 Units Subcutaneous Q4H   linezolid  600 mg Per Tube Q12H   methocarbamol  1,000 mg Per Tube TID   metoCLOPramide  10 mg Per Tube Q8H   multivitamin with minerals  1 tablet Per Tube Daily   nutrition supplement (JUVEN)  1 packet Oral BID BM   mouth rinse  15 mL Mouth Rinse 4 times per day   pantoprazole  40 mg Oral Daily   silver nitrate applicators  1 Application Topical Once   sodium chloride flush  10-40 mL Intracatheter Q12H   zinc sulfate  220 mg Oral Daily   Continuous Infusions:  sodium chloride 10 mL/hr at 10/25/22 0517   sodium chloride Stopped (10/24/22 1615)   sodium chloride Stopped (10/24/22 2222)   dextrose Stopped (10/24/22 1114)   feeding supplement (VITAL 1.5 CAL) Stopped (10/21/22 1328)   magnesium sulfate bolus IVPB     meropenem (MERREM) IV Stopped (10/25/22 0321)     LOS: 22 days   Hughie Closs, MD [facility]  10/25/2022, 7:51 AM   *Please note that this is a verbal dictation therefore any spelling or grammatical errors are due to the "Dragon Medical One" system interpretation.  Please page via Amion and do not message via secure chat for urgent patient care matters. Secure chat can be used for non urgent patient care matters.  How to contact the [facility] Attending or Consulting provider 7A - 7P or covering provider during after hours 7P -7A, for this patient?  Check the care team in [facility] and look for a) attending/consulting [facility] provider listed and b) the [facility] team listed. Page or secure chat 7A-7P. Log into [email or web address] and use [place]'s universal password to access. If you do not have the password, please contact the hospital operator. Locate the [facility] provider you are looking for under [facility] and  page to a number that you can be directly reached. If you still have difficulty reaching the provider, please page the [facility] (Director on Call) for the Hospitalists listed on amion for assistance.

## 2022-10-25 NOTE — Evaluation (Signed)
Occupational Therapy Evaluation Patient Details Name: Kelli Hudson MRN: [id number] DOB: August 30, 1990 Today's Date: 10/25/2022   History of Present Illness Patient is 32 y.o. female presented to hospital 10/03/22 for bil foot wounds, fever, and pain for ~2 weeks with no improvement with antibiotics. Pt admitted for sepsis with necrotizing fasciitis/gangrene bilateral feet elected for surgical management. Pt underwent excision and debridement of BLE extending feet to thighs on 10/08/22 and remained intubated in ICU. 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 PMH significant for DM, HTN, gastric pacemaker.   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 Pt presents with impaired balance and decreased strength, did well with participation for therapy despite grief. She is mod to max A +2 for transfers at this time and would benefit from more UE strengthening as well as education for ADLs. Educated pt on phantom pain and sensation and reinforced knee extension when at rest for joint integrity. Pt would benefit from continued acute skilled OT services to address listed deficits and help transition to next level of care. Patient has the potential to reach Mod I and demos the ability to tolerate 3 hours of therapy. Pt would benefit from an intensive rehab program to help maximize functional independence.       If plan is discharge home, recommend the following: A lot of help with walking and/or transfers;A lot of help with bathing/dressing/bathroom;Assistance with cooking/housework;Assist for transportation;Help with stairs or ramp for entrance    Functional Status Assessment  Patient has had a recent decline in their functional status and demonstrates the ability to make significant improvements in function in a reasonable and predictable amount of time.  Equipment Recommendations  Wheelchair (measurements OT);BSC/3in1;Tub/shower bench (drop arm BSC)     Recommendations for Other Services Rehab consult     Precautions / Restrictions Precautions Precautions: Fall Precaution Comments: bil wound vac, NGT, foley Restrictions Weight Bearing Restrictions: Yes RLE Weight Bearing: Non weight bearing LLE Weight Bearing: Non weight bearing Other Position/Activity Restrictions: bil BKA      Mobility Bed Mobility Overal bed mobility: Needs Assistance Bed Mobility: Supine to Sit     Supine to sit: Mod assist, Max assist, +2 for safety/equipment, Used rails, HOB elevated     General bed mobility comments: step by step cues provided for reaching to bed rail and mod-max +2 assist to pivot with use of bed pad to scoto hips/LE's towards EOB. Pt able to pull trunk up with mod assist and use of rail.    Transfers Overall transfer level: Needs assistance   Transfers: Bed to chair/wheelchair/BSC            Lateral/Scoot Transfers: Max assist, +2 safety/equipment, +2 physical assistance General transfer comment: Max+2 with use of bed pad for lateral scoot bed>chair. cues for head:hip relationship to facilitate transfer.      Balance Overall balance assessment: Needs assistance Sitting-balance support: Feet unsupported, Bilateral upper extremity supported Sitting balance-Leahy Scale: Fair Sitting balance - Comments: reliant on support at start for static balance. fading to CGA.                                   ADL either performed or assessed with clinical judgement   ADL Overall ADL's : Needs assistance/impaired Eating/Feeding: Independent;Bed level   Grooming: Sitting;Contact guard assist   Upper Body Bathing: Bed level;Set up   Lower  Body Bathing: Minimal assistance;Bed level   Upper Body Dressing : Set up;Bed level   Lower Body Dressing: Maximal assistance;Bed level     Toilet Transfer Details (indicate cue type and reason): defer           General ADL Comments: Educated pt on typical prosthesis  timeline and potential for adaptive driving controls to regain independence (could use resources). Also educated pt on phantom pain/sensation and encourage stimulation of sensory system to reduce the risk of either     Vision         Perception         Praxis         Pertinent Vitals/Pain Pain Assessment Pain Assessment: 0-10 Pain Score: 10-Worst pain ever Pain Location: bil LE Pain Descriptors / Indicators: Aching, Operative site guarding, Sore Pain Intervention(s): Limited activity within patient's tolerance, Monitored during session, Repositioned, Premedicated before session     Extremity/Trunk Assessment Upper Extremity Assessment Upper Extremity Assessment: Generalized weakness;Right hand dominant RUE Deficits / Details: MMT 4/5 overall LUE Deficits / Details: MMT 3+/5 overall   Lower Extremity Assessment Lower Extremity Assessment: RLE deficits/detail;LLE deficits/detail RLE Deficits / Details: wound vac in place bil, ROM limited from 0-50* for bil knees. pt limited by pain. quad activation noted but limited by pain and pt able to abd/add hips. LLE Deficits / Details: wound vac in place bil, ROM limited from 0-50* for bil knees. pt limited by pain. quad activation noted but limited by pain and pt able to abd/add hips.   Cervical / Trunk Assessment Cervical / Trunk Assessment: Other exceptions Cervical / Trunk Exceptions: habitus   Communication Communication Communication: No apparent difficulties Cueing Techniques: Verbal cues;Tactile cues;Visual cues   Cognition Arousal: Alert Behavior During Therapy: Flat affect Overall Cognitive Status: Within Functional Limits for tasks assessed                                 General Comments: Pt still in the first stages of grief     General Comments  see ADLs for education section    Exercises     Shoulder Instructions      Home Living Family/patient expects to be discharged to:: Private  residence Living Arrangements: Parent (plan to live with mom upon DC) Available Help at Discharge: Family;Available PRN/intermittently Type of Home: House Home Access: Stairs to enter [employer] of Steps: 2 steps Entrance Stairs-Rails: Right;Can reach both;Left Home Layout: Two level (town home) Alternate Level Stairs-Number of Steps: 13 steps Alternate Level Stairs-Rails: Right;Left;Can reach both Bathroom Shower/Tub: Tub/shower unit;Walk-in shower (walkin shower is downstairs, 1 inch thershold)   Firefighter: Pharmacist, community: Yes How Accessible: Accessible via wheelchair Home Equipment: Crutches   Additional Comments: mom works during the day. Pt used to work at the [employer].      Prior Functioning/Environment Prior Level of Function : Independent/Modified Independent                        OT Problem List: Decreased strength;Impaired balance (sitting and/or standing)      OT Treatment/Interventions: Self-care/ADL training;Balance training;Therapeutic exercise;Therapeutic activities;DME and/or AE instruction;Patient/family education    OT Goals(Current goals can be found in the care plan section) Acute Rehab OT Goals Patient Stated Goal: To get prosthetics OT Goal Formulation: With patient Time For Goal Achievement: 11/08/22 Potential to Achieve Goals: Good ADL Goals Pt Will Perform Grooming: sitting;with  modified independence Pt Will Perform Lower Body Bathing: bed level;with set-up Pt Will Perform Lower Body Dressing: bed level;with supervision;with set-up Pt Will Transfer to Toilet: anterior/posterior transfer;with contact guard assist;bedside commode Pt/caregiver will Perform Home Exercise Program: Increased strength;Both right and left upper extremity;With theraband;With written HEP provided  OT Frequency: Min 1X/week    Co-evaluation              AM-PAC OT "6 Clicks" Daily Activity     Outcome Measure Help from  another person eating meals?: None Help from another person taking care of personal grooming?: A Little Help from another person toileting, which includes using toliet, bedpan, or urinal?: A Lot Help from another person bathing (including washing, rinsing, drying)?: A Little (bed level) Help from another person to put on and taking off regular upper body clothing?: A Little Help from another person to put on and taking off regular lower body clothing?: A Lot 6 Click Score: 17   End of Session Equipment Utilized During Treatment: Other (comment) (slide board) Nurse Communication: Mobility status (+2 lateral scoot with pads)  Activity Tolerance: Patient tolerated treatment well Patient left: in chair;with call bell/phone within reach;with chair alarm set;with family/visitor present  OT Visit Diagnosis: Unsteadiness on feet (R26.81);Muscle weakness (generalized) (M62.81)                Time: 0981-1914 OT Time Calculation (min): 35 min Charges:  OT General Charges $OT Visit: 1 Visit OT Evaluation $OT Eval Moderate Complexity: 1 Mod  10/25/2022  AB, OTR/L  Acute Rehabilitation Services  Office: [phone]   Tristan Schroeder 10/25/2022, 2:49 PM

## 2022-10-26 ENCOUNTER — Encounter (HOSPITAL_COMMUNITY): Payer: Self-pay | Admitting: Orthopedic Surgery

## 2022-10-26 DIAGNOSIS — M726 Necrotizing fasciitis: Secondary | ICD-10-CM | POA: Diagnosis not present

## 2022-10-26 LAB — GLUCOSE, CAPILLARY
Glucose-Capillary: 106 mg/dL — ABNORMAL HIGH (ref 70–99)
Glucose-Capillary: 122 mg/dL — ABNORMAL HIGH (ref 70–99)
Glucose-Capillary: 142 mg/dL — ABNORMAL HIGH (ref 70–99)
Glucose-Capillary: 150 mg/dL — ABNORMAL HIGH (ref 70–99)
Glucose-Capillary: 224 mg/dL — ABNORMAL HIGH (ref 70–99)
Glucose-Capillary: 270 mg/dL — ABNORMAL HIGH (ref 70–99)

## 2022-10-26 LAB — BASIC METABOLIC PANEL
Anion gap: 6 (ref 5–15)
BUN: 50 mg/dL — ABNORMAL HIGH (ref 6–20)
CO2: 19 mmol/L — ABNORMAL LOW (ref 22–32)
Calcium: 7.3 mg/dL — ABNORMAL LOW (ref 8.9–10.3)
Chloride: 107 mmol/L (ref 98–111)
Creatinine, Ser: 1.48 mg/dL — ABNORMAL HIGH (ref 0.44–1.00)
GFR, Estimated: 48 mL/min — ABNORMAL LOW (ref >60)
Glucose, Bld: 120 mg/dL — ABNORMAL HIGH (ref 70–99)
Potassium: 4.7 mmol/L (ref 3.5–5.1)
Sodium: 132 mmol/L — ABNORMAL LOW (ref 135–145)

## 2022-10-26 LAB — CBC
HCT: 22.2 % — ABNORMAL LOW (ref 36.0–46.0)
Hemoglobin: 7.2 g/dL — ABNORMAL LOW (ref 12.0–15.0)
MCH: 29.3 pg (ref 26.0–34.0)
MCHC: 32.4 g/dL (ref 30.0–36.0)
MCV: 90.2 fL (ref 80.0–100.0)
Platelets: 361 10*3/uL (ref 150–400)
RBC: 2.46 MIL/uL — ABNORMAL LOW (ref 3.87–5.11)
RDW: 16 % — ABNORMAL HIGH (ref 11.5–15.5)
WBC: 32.3 10*3/uL — ABNORMAL HIGH (ref 4.0–10.5)
nRBC: 0.2 % (ref 0.0–0.2)

## 2022-10-26 MED ORDER — FOLIC ACID 1 MG PO TABS
1.0000 mg | ORAL_TABLET | Freq: Every day | ORAL | Status: AC
  Administered 2022-10-27 – 2022-11-08 (×11): 1 mg via ORAL
  Filled 2022-10-26 (×11): qty 1

## 2022-10-26 MED ORDER — METHOCARBAMOL 500 MG PO TABS
1000.0000 mg | ORAL_TABLET | Freq: Three times a day (TID) | ORAL | Status: DC
  Administered 2022-10-26 – 2022-11-22 (×75): 1000 mg via ORAL
  Filled 2022-10-26 (×76): qty 2

## 2022-10-26 MED ORDER — VITAMIN C 500 MG PO TABS
1000.0000 mg | ORAL_TABLET | Freq: Every day | ORAL | Status: DC
  Administered 2022-10-27 – 2022-11-22 (×24): 1000 mg via ORAL
  Filled 2022-10-26 (×24): qty 2

## 2022-10-26 MED ORDER — POTASSIUM CHLORIDE 20 MEQ PO PACK: 20.0000 meq | PACK | Freq: Every day | ORAL | Status: DC | PRN

## 2022-10-26 MED ORDER — ZINC SULFATE 220 (50 ZN) MG PO CAPS
220.0000 mg | ORAL_CAPSULE | Freq: Every day | ORAL | Status: AC
  Administered 2022-10-27 – 2022-11-06 (×10): 220 mg via ORAL
  Filled 2022-10-26 (×10): qty 1

## 2022-10-26 MED ORDER — FUROSEMIDE 40 MG PO TABS
40.0000 mg | ORAL_TABLET | Freq: Every day | ORAL | Status: DC
  Administered 2022-10-27 – 2022-11-04 (×9): 40 mg via ORAL
  Filled 2022-10-26 (×10): qty 1

## 2022-10-26 MED ORDER — BANATROL TF EN LIQD
60.0000 mL | Freq: Two times a day (BID) | ENTERAL | Status: DC
  Administered 2022-10-26 (×2): 60 mL via ORAL
  Filled 2022-10-26 (×4): qty 60

## 2022-10-26 MED ORDER — ADULT MULTIVITAMIN W/MINERALS CH
1.0000 | ORAL_TABLET | Freq: Every day | ORAL | Status: DC
  Administered 2022-10-27 – 2022-11-22 (×24): 1 via ORAL
  Filled 2022-10-26 (×24): qty 1

## 2022-10-26 MED ORDER — OMEPRAZOLE 2 MG/ML ORAL SUSPENSION
40.0000 mg | Freq: Every day | ORAL | Status: DC
  Administered 2022-10-26 – 2022-10-27 (×2): 40 mg via ORAL
  Filled 2022-10-26 (×3): qty 20

## 2022-10-26 MED ORDER — JUVEN PO PACK
1.0000 | PACK | Freq: Two times a day (BID) | ORAL | Status: DC
  Administered 2022-10-26 – 2022-11-13 (×25): 1 via ORAL
  Filled 2022-10-26 (×26): qty 1

## 2022-10-26 MED ORDER — DOCUSATE SODIUM 50 MG/5ML PO LIQD
100.0000 mg | Freq: Every day | ORAL | Status: DC
  Administered 2022-10-27: 100 mg via ORAL
  Filled 2022-10-26: qty 10

## 2022-10-26 MED ORDER — LINEZOLID 600 MG PO TABS
600.0000 mg | ORAL_TABLET | Freq: Two times a day (BID) | ORAL | Status: DC
  Administered 2022-10-26 – 2022-10-27 (×2): 600 mg via ORAL
  Filled 2022-10-26 (×3): qty 1

## 2022-10-26 MED ORDER — ALUM & MAG HYDROXIDE-SIMETH 200-200-20 MG/5ML PO SUSP: 15.0000 mL | ORAL | Status: DC | PRN

## 2022-10-26 MED ORDER — ACETAMINOPHEN 500 MG PO TABS
1000.0000 mg | ORAL_TABLET | Freq: Four times a day (QID) | ORAL | Status: DC
  Administered 2022-10-26 – 2022-11-22 (×95): 1000 mg via ORAL
  Filled 2022-10-26 (×100): qty 2

## 2022-10-26 MED ORDER — GABAPENTIN 300 MG PO CAPS
300.0000 mg | ORAL_CAPSULE | Freq: Three times a day (TID) | ORAL | Status: DC
  Administered 2022-10-26 – 2022-11-22 (×79): 300 mg via ORAL
  Filled 2022-10-26 (×79): qty 1

## 2022-10-26 MED ORDER — POLYETHYLENE GLYCOL 3350 17 G PO PACK: 17.0000 g | PACK | Freq: Every day | ORAL | Status: DC | PRN

## 2022-10-26 MED ORDER — METOCLOPRAMIDE HCL 5 MG PO TABS
10.0000 mg | ORAL_TABLET | Freq: Three times a day (TID) | ORAL | Status: DC
  Administered 2022-10-26 – 2022-11-22 (×79): 10 mg via ORAL
  Filled 2022-10-26 (×79): qty 2

## 2022-10-26 MED ORDER — OXYCODONE HCL 5 MG PO TABS
5.0000 mg | ORAL_TABLET | ORAL | Status: DC | PRN
  Administered 2022-10-26: 10 mg via ORAL
  Administered 2022-10-26: 5 mg via ORAL
  Administered 2022-10-27 (×2): 10 mg via ORAL
  Filled 2022-10-26 (×4): qty 2
  Filled 2022-10-26: qty 1

## 2022-10-26 NOTE — Progress Notes (Signed)
Pt able to tolerate diet well.  Pt reported having 5 cups of chicken broth at dinner time.

## 2022-10-26 NOTE — Plan of Care (Signed)
  Problem: Clinical Measurements: Goal: Will remain free from infection Outcome: Progressing Goal: Diagnostic test results will improve Outcome: Progressing Goal: Respiratory complications will improve Outcome: Progressing Goal: Cardiovascular complication will be avoided Outcome: Progressing   Problem: Activity: Goal: Risk for activity intolerance will decrease Outcome: Progressing   

## 2022-10-26 NOTE — Progress Notes (Signed)
PROGRESS NOTE    Kelli Hudson  ZOX:[id number] DOB: 27-Jul-1990 DOA: 10/03/2022 PCP: Norm Salt, PA   Brief Narrative:  32 y.o. female with medical history significant of DM1, diabetic gastroparesis with very frequent admission in the past, HTN presented w/ c/o B foot wounds, fever, and pain and informed that she has been dealing with lower extremity wound for past 2 weeks ,followed with her podiatrist in the outpatient setting at [facility] there which she recently finished a course of doxycycline which did not help her symptoms, initially had a blister that ruptured subsequently with redness streaking up her legs and noticing fever chills worsening pain in last few days.  Patient admitted for sepsis due to cellulitis/lower extremity wound, severe anemia, AKI. Podiatry consulted- Unable to complete MRI as gastric pacemaker is not MRI safe 8/21 ortho consulted -- transferred to [facility], underwent excision and debridement of BLE --extending feet to thighs. EBL from this case is not recorded but pt did receive 4 PRBC 2 FFP as well as crystalloid + colloid, as well as pressors for hemodynamic support .  Remained intubated after case. PCCM consulted for ICU management 9/6- transferred to [facility] with plans for b/l BKA on same day  Assessment & Plan:   Principal Problem:   Necrotizing fasciitis (HCC) Active Problems:   AKI (acute kidney injury) (HCC)   Sepsis due to cellulitis (HCC)   Cellulitis of lower extremity   Anemia   Diabetic gastroparesis associated with type 1 diabetes mellitus (HCC)   DM type 1 (diabetes mellitus, type 1) (HCC)   Drug-seeking behavior   Sepsis (HCC)   Necrotizing fasciitis of pelvic region and thigh (HCC)   Necrotizing fasciitis of lower leg (HCC)   MDD (major depressive disorder), recurrent episode, moderate (HCC)  Severe sepsis due to bilateral feet abscess, necrotizing fasciitis, POA.  MRSE, sensitive Pseudomonas S/p bilateral excisional leg  and thigh debridement Status post BKA with Dr. Lajoyce Corners on 9/6.  She has wound VAC.  Pain control per orthopedics. -Midodrine discontinued on 9/4 -Continue meropenem and linezolid per ID.  By PT OT, CIR recommended.  They are following as well.   Acute blood loss anemia due to operative blood loss -underwent excision and debridement of BLE --extending feet to thighs. EBL from this case is not recorded but pt did receive 4 PRBC 2 FFP -Transfusion goal hemoglobin 7.0, currently hemoglobin 7.2.  Repeat CBC in the morning.   Type 1 diabetes, poorly controlled with hyperglycemia and complicated with gastropathy -On SSI only and blood sugar now controlled except 1 episode of hyperglycemia early this morning. -Continue sliding scale insulin as per protocol, CBG goal 140-180   Acute kidney injury on CKD stage IIIa due to septic ATN, improving Non-anion gap metabolic acidosis Hyponatremia, euvolemic -Baseline creatinine appears to be around 1.4.  Creatinine peaked at 2.54 but improving and 1. 4 8 today.  Follow daily.   Severe protein calorie malnutrition Patient is getting tube feeds through core Trak.  Mother is asking when the tube will come off as patient has started eating 100% of the meals.  Patient's RN has been advised to reach out to the dietitian so they can do calorie count and make a decision on that.   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   Bilateral upper extremity edema: She appears to have +3 pitting edema in upper extremities, although slightly improved from yesterday.  Continue p.o. Lasix.    DVT prophylaxis: SCD's Start: 10/24/22 1557 SCDs Start: 10/05/22 1253 Place and maintain sequential compression  device Start: 10/04/22 0825   Code Status: Full Code  Family Communication:  sister and brother present at bedside.  Plan of care discussed with patient in length and he/she verbalized understanding and agreed with it.  Status is: Inpatient Remains inpatient appropriate because: Recovering from surgery   Estimated body mass index is 34.18 kg/m as calculated from the following:   Height as of this encounter: 5\' 7"  (1.702 m).   Weight as of this encounter: 99 kg.  Pressure Injury 10/07/22 Elbow Posterior;Right Stage 2 -  Partial thickness loss of dermis presenting as a shallow open injury with a red, pink wound bed without slough. (Active)  10/07/22 1226  Location: Elbow  Location Orientation: Posterior;Right  Staging: Stage 2 -  Partial thickness loss of dermis presenting as a shallow open injury with a red, pink wound bed without slough.  Wound Description (Comments):   Present on Admission:   Dressing Type Negative pressure wound therapy 10/25/22 1940   Nutritional Assessment: Body mass index is 34.18 kg/m.[name] Seen by dietician.  I agree with the assessment and plan as outlined below: Nutrition Status: Nutrition Problem: Increased nutrient needs Etiology: wound healing Signs/Symptoms: estimated needs Interventions: Juven, Glucerna shake  . Skin Assessment: I have examined the patient's skin and I agree with the wound assessment as performed by the wound care RN as outlined below: Pressure Injury 10/07/22 Elbow Posterior;Right Stage 2 -  Partial thickness loss of dermis presenting as a shallow open injury with a red, pink wound bed without slough. (Active)  10/07/22 1226  Location: Elbow  Location Orientation: Posterior;Right  Staging: Stage 2 -  Partial thickness loss of dermis presenting as a shallow open injury with a red, pink wound bed without slough.  Wound Description (Comments):   Present on Admission:   Dressing Type Negative pressure wound therapy 10/25/22 1940     Consultants:  Orthopedics  Procedures:  As above  Antimicrobials:  Anti-infectives (From admission, onward)    Start     Dose/Rate Route Frequency Ordered Stop   10/26/22 2200  linezolid (ZYVOX) tablet 600 mg       Note to Pharmacy: Can you schedule her Q12 from her last dose   600 mg Oral Every 12 hours 10/26/22 0959     10/24/22 1130  ceFAZolin (ANCEF) IVPB 2g/100 mL premix        2 g 200 mL/hr over 30 Minutes Intravenous On call to O.R. 10/23/22 1954 10/24/22 1334   10/24/22 1130  meropenem (MERREM) 1 g in sodium chloride 0.9 % 100 mL IVPB        1 g 200 mL/hr over 30 Minutes Intravenous Every 8 hours 10/24/22 1039     10/24/22 0600  ceFAZolin (ANCEF) IVPB 2g/100 mL premix  Status:  Discontinued        2 g 200 mL/hr over 30 Minutes Intravenous On call to O.R. 10/23/22 1949 10/23/22 1954   10/23/22 0600  ceFAZolin (ANCEF) IVPB 2g/100 mL premix  Status:  Discontinued        2 g 200 mL/hr over 30 Minutes Intravenous On call to O.R. 10/22/22 1503 10/22/22 1607   10/22/22 1521  ceFAZolin (ANCEF) 2-4 GM/100ML-% IVPB  Status:  Discontinued       Note to Pharmacy: [facility], [place]: cabinet override      10/22/22 1521 10/22/22 1552   10/21/22 1030  linezolid (ZYVOX) tablet 600 mg  Status:  Discontinued       Note to Pharmacy: Can you schedule her Q12 from her last dose   600 mg Per Tube Every 12 hours 10/21/22 0931 10/26/22 0959   10/19/22 2200  meropenem (MERREM) 1 g in sodium chloride 0.9 % 100 mL IVPB  Status:  Discontinued        1 g 200 mL/hr over 30 Minutes Intravenous Every 12 hours 10/19/22 1058 10/24/22 1039   10/19/22 2200  linezolid (ZYVOX) tablet 600 mg  Status:  Discontinued       Note to Pharmacy: Can you schedule her Q12 from her last dose   600 mg Oral Every 12 hours 10/19/22 1103 10/21/22 0931   10/18/22 1600  ceFEPIme (MAXIPIME) 2 g in sodium chloride 0.9 % 100 mL IVPB  Status:  Discontinued        2 g 200 mL/hr over 30 Minutes Intravenous Every 12 hours 10/17/22  1311 10/18/22 0757   10/18/22 0756  ceFEPIme (MAXIPIME) 2 g in sodium chloride 0.9 % 100 mL IVPB  Status:  Discontinued        2 g 200 mL/hr over 30 Minutes Intravenous Every 12 hours 10/18/22 0757 10/19/22 1049   10/17/22 0847  ceFAZolin (ANCEF) 2-4 GM/100ML-% IVPB       Note to Pharmacy: Phebe Colla N: cabinet override      10/17/22 0847 10/17/22 1000   10/17/22 0830  ceFAZolin (ANCEF) IVPB 2g/100 mL premix        2 g 200 mL/hr over 30 Minutes Intravenous On call to O.R. 10/17/22 1610 10/17/22 0953   10/16/22 1200  ceFEPIme (MAXIPIME) 2 g in sodium chloride 0.9 % 100 mL IVPB  Status:  Discontinued        2 g 200 mL/hr over 30 Minutes Intravenous Every 8 hours 10/16/22 0954 10/17/22 1311   10/15/22 2200  linezolid (ZYVOX) tablet 600 mg  Status:  Discontinued        600 mg Oral Every 12 hours 10/15/22 1420 10/19/22 1049   10/15/22 2200  metroNIDAZOLE (FLAGYL) tablet 500 mg  Status:  Discontinued        500 mg Oral Every 12 hours 10/15/22 1420 10/19/22 1049   10/12/22 1500  linezolid (ZYVOX) tablet 600 mg  Status:  Discontinued        600 mg Per Tube Every 12 hours 10/12/22 1414 10/15/22 1420   10/11/22 1100  linezolid (ZYVOX) tablet 600 mg  Status:  Discontinued        600 mg Per Tube Every 12 hours 10/11/22 1008 10/11/22 1250   10/10/22 2200  metroNIDAZOLE (FLAGYL) tablet 500 mg  Status:  Discontinued        500 mg Per Tube Every 12 hours 10/10/22 0814 10/15/22 1420   10/09/22 0600  ceFAZolin (ANCEF) IVPB 2g/100 mL premix  Status:  Discontinued        2 g 200 mL/hr over 30 Minutes Intravenous On call to O.R. 10/08/22 1425 10/08/22 1615   10/08/22 1600  ceFEPIme (MAXIPIME) 2 g in sodium chloride 0.9 % 100 mL IVPB  Status:  Discontinued        2 g 200 mL/hr over 30 Minutes Intravenous Every 12 hours 10/08/22 0736 10/16/22 0954   10/07/22 2200  linezolid (ZYVOX)  IVPB 600 mg  Status:  Discontinued       Note to Pharmacy: Per Dr Renold Don- will likely d/c Vanc 8/21 buts to keep both while  awaiting blood cultures 8/20   600 mg 300 mL/hr over 60 Minutes Intravenous Every 12 hours 10/07/22 1658 10/11/22 1008   10/07/22 2000  ceFEPIme (MAXIPIME) 2 g in sodium chloride 0.9 % 100 mL IVPB  Status:  Discontinued        2 g 200 mL/hr over 30 Minutes Intravenous Every 8 hours 10/07/22 1233 10/08/22 0736   10/04/22 1600  vancomycin (VANCOREADY) IVPB 1250 mg/250 mL  Status:  Discontinued        1,250 mg 166.7 mL/hr over 90 Minutes Intravenous Every 24 hours 10/03/22 2312 10/08/22 0932   10/04/22 0000  ceFEPIme (MAXIPIME) 2 g in sodium chloride 0.9 % 100 mL IVPB  Status:  Discontinued        2 g 200 mL/hr over 30 Minutes Intravenous Every 12 hours 10/03/22 2312 10/07/22 1233   10/03/22 1530  vancomycin (VANCOCIN) 500 mg in sodium chloride 0.9 % 100 mL IVPB        500 mg 100 mL/hr over 60 Minutes Intravenous  Once 10/03/22 1418 10/03/22 2020   10/03/22 1430  vancomycin (VANCOCIN) IVPB 1000 mg/200 mL premix        1,000 mg 200 mL/hr over 60 Minutes Intravenous  Once 10/03/22 1417 10/03/22 1830   10/03/22 1415  metroNIDAZOLE (FLAGYL) IVPB 500 mg        500 mg 100 mL/hr over 60 Minutes Intravenous Every 12 hours 10/03/22 1413 10/10/22 0520   10/03/22 1415  ceFEPIme (MAXIPIME) 2 g in sodium chloride 0.9 % 100 mL IVPB        2 g 200 mL/hr over 30 Minutes Intravenous  Once 10/03/22 1417 10/03/22 1530         Subjective: Patient seen and examined.  She has no complaints at all.  Sister and brother at the bedside.  Objective: Vitals:   10/25/22 1831 10/25/22 2200 10/26/22 0402 10/26/22 0738  BP: 118/69 (!) 125/57 (!) 148/93 118/63  Pulse: (!) 101 100 (!) 101 100  Resp:  16 16 17   Temp: 98.3 F (36.8 C) 98.3 F (36.8 C) 98.4 F (36.9 C) 98.9 F (37.2 C)  TempSrc: Oral Oral Oral Oral  SpO2: 100% 99% 99% 98%  Weight:      Height:        Intake/Output Summary (Last 24 hours) at 10/26/2022 1028 Last data filed at 10/26/2022 0400 Gross per 24 hour  Intake 810 ml  Output 3300 ml   Net -2490 ml   Filed Weights   10/21/22 0500 10/22/22 0500 10/24/22 0500  Weight: 99.1 kg 99 kg 99 kg    Examination:  General exam: Appears calm and comfortable  Respiratory system: Clear to auscultation. Respiratory effort normal. Cardiovascular system: S1 & S2 heard, RRR. No JVD, murmurs, rubs, gallops or clicks.  +3 pitting edema bilateral upper extremities. Gastrointestinal system: Abdomen is nondistended, soft and nontender. No organomegaly or masses felt. Normal bowel sounds heard. Central nervous system: Alert and oriented. No focal neurological deficits. Extremities: Bilateral BKA with wound VAC attached. Skin: No rashes, lesions or ulcers.  Psychiatry: Judgement and insight appear normal. Mood & affect appropriate.    Data Reviewed: I have personally reviewed following labs and imaging studies  CBC: Recent Labs  Lab 10/20/22 0356 10/21/22 0246 10/22/22 0865 10/23/22 7846 10/24/22 0327 10/25/22 0344 10/26/22 0310  WBC 53.3*   < > 44.2* 43.9* 33.8* 27.6* 32.3*  NEUTROABS 43.9*  --   --   --   --   --   --   HGB 7.3*   < > 9.6* 10.2* 8.7* 7.9* 7.2*  HCT 21.9*   < > 28.8* 31.0* 26.9* 24.3* 22.2*  MCV 90.1   < > 89.2 90.6 93.1 92.7 90.2  PLT 474*   < > 606* 650* 546* 394 361   < > = values in this interval not displayed.   Basic Metabolic Panel: Recent Labs  Lab 10/20/22 0356 10/21/22 0246 10/22/22 0511 10/23/22 0415 10/24/22 0327 10/25/22 0344 10/26/22 0310  NA 133* 139 141 139 132* 130* 132*  K 4.1 4.6 4.2 4.2 4.6 4.4 4.7  CL 110 110 115* 113* 108 106 107  CO2 18* 18* 18* 20* 19* 19* 19*  GLUCOSE 125* 190* 124* 123* 152* 92 120*  BUN 86* 89* 87* 84* 80* 68* 50*  CREATININE 2.54* 2.38* 2.16* 2.04* 1.82* 1.63* 1.48*  CALCIUM 8.0* 8.3* 8.4* 8.7* 7.9* 7.3* 7.3*  MG 2.0 2.1  --  2.1 1.9  --   --   PHOS  --  3.6  --   --  3.5  --   --    GFR: Estimated Creatinine Clearance: 66.6 mL/min (A) (by C-G formula based on SCr of 1.48 mg/dL (H)). Liver Function  Tests: No results for input(s): "AST", "ALT", "ALKPHOS", "BILITOT", "PROT", "ALBUMIN" in the last 168 hours. No results for input(s): "LIPASE", "AMYLASE" in the last 168 hours. No results for input(s): "AMMONIA" in the last 168 hours. Coagulation Profile: Recent Labs  Lab 10/22/22 0511  INR 1.6*   Cardiac Enzymes: No results for input(s): "CKTOTAL", "CKMB", "CKMBINDEX", "TROPONINI" in the last 168 hours. BNP (last 3 results) No results for input(s): "PROBNP" in the last 8760 hours. HbA1C: No results for input(s): "HGBA1C" in the last 72 hours. CBG: Recent Labs  Lab 10/25/22 1632 10/25/22 1959 10/26/22 0021 10/26/22 0349 10/26/22 0736  GLUCAP 153* 184* 122* 106* 224*   Lipid Profile: No results for input(s): "CHOL", "HDL", "LDLCALC", "TRIG", "CHOLHDL", "LDLDIRECT" in the last 72 hours. Thyroid Function Tests: No results for input(s): "TSH", "T4TOTAL", "FREET4", "T3FREE", "THYROIDAB" in the last 72 hours. Anemia Panel: No results for input(s): "VITAMINB12", "FOLATE", "FERRITIN", "TIBC", "IRON", "RETICCTPCT" in the last 72 hours. Sepsis Labs: No results for input(s): "PROCALCITON", "LATICACIDVEN" in the last 168 hours.  Recent Results (from the past 240 hour(s))  C Difficile Quick Screen (NO PCR Reflex)     Status: None   Collection Time: 10/17/22  1:22 PM   Specimen: STOOL  Result Value Ref Range Status   C Diff antigen NEGATIVE NEGATIVE Final   C Diff toxin NEGATIVE NEGATIVE Final   C Diff interpretation No C. difficile detected.  Final    Comment: Performed at [facility], [address], [place], [place] [address]  Surgical pcr screen     Status: None   Collection Time: 10/23/22  9:05 PM   Specimen: Nasal Mucosa; Nasal Swab  Result Value Ref Range Status   MRSA, PCR NEGATIVE NEGATIVE Final   Staphylococcus aureus NEGATIVE NEGATIVE Final    Comment: (NOTE) The Xpert SA Assay (FDA approved for NASAL specimens in patients 69 years of age and older), is one  component of a comprehensive surveillance program. It is not intended to diagnose infection nor to guide or monitor treatment. Performed at [facility], [address] [place], [place] [address]   Aerobic/Anaerobic Culture w Gram Stain (surgical/deep wound)     Status: None (Preliminary result)   Collection Time: 10/24/22  2:43 PM   Specimen: Path fluid; Body Fluid  Result Value Ref Range Status   Specimen Description FLUID  Final   Special Requests right knee joint fluid  Final   Gram Stain   Final    RARE WBC PRESENT, PREDOMINANTLY PMN NO ORGANISMS SEEN    Culture   Final    NO GROWTH 2 DAYS Performed at [facility], [address] [place], [place] [address]    Report Status PENDING  Incomplete     Radiology Studies: PERIPHERAL VASCULAR CATHETERIZATION  Result Date: 10/24/2022 See surgical note for result.   Scheduled Meds:  acetaminophen  1,000 mg Oral Q6H   [START ON 10/27/2022] vitamin C  1,000 mg Oral Daily   Chlorhexidine Gluconate Cloth  6 each Topical Daily   docusate  100 mg Oral Daily   DULoxetine  30 mg Oral Daily   feeding supplement (PROSource TF20)  60 mL Per Tube Daily   fiber supplement (BANATROL TF)  60 mL Oral BID   [START ON 10/27/2022] folic acid  1 mg Oral Daily   [START ON 10/27/2022] furosemide  40 mg Oral Daily   gabapentin  300 mg Per Tube Q8H   insulin aspart  0-15 Units Subcutaneous Q4H   linezolid  600 mg Oral Q12H   methocarbamol  1,000 mg Oral TID   metoCLOPramide  10 mg Oral Q8H   [START ON 10/27/2022] multivitamin with minerals  1 tablet Oral Daily   nutrition supplement (JUVEN)  1 packet Oral BID BM   omeprazole  40 mg Oral Daily   mouth rinse  15 mL Mouth Rinse 4 times per day   silver nitrate applicators  1 Application Topical Once   sodium chloride flush  10-40 mL Intracatheter Q12H   [START ON 10/27/2022] zinc sulfate  220 mg Oral Daily   Continuous Infusions:  sodium chloride 10 mL/hr at 10/25/22 0517   sodium chloride  Stopped (10/24/22 1615)   sodium chloride Stopped (10/24/22 2222)   dextrose Stopped (10/24/22 1114)   feeding supplement (VITAL 1.5 CAL) Stopped (10/21/22 1328)   magnesium sulfate bolus IVPB     meropenem (MERREM) IV 1 g (10/26/22 0645)     LOS: 23 days   Hughie Closs, MD [facility]  10/26/2022, 10:28 AM   *Please note that this is a verbal dictation therefore any spelling or grammatical errors are due to the "Dragon Medical One" system interpretation.  Please page via Amion and do not message via secure chat for urgent patient care matters. Secure chat can be used for non urgent patient care matters.  How to contact the [facility] Attending or Consulting provider 7A - 7P or covering provider during after hours 7P -7A, for this patient?  Check the care team in [facility] and look for a) attending/consulting [facility] provider listed and b) the [facility] team listed. Page or secure chat 7A-7P. Log into [email or web address] and use [place]'s universal password to access. If you do not have the password, please contact the hospital operator. Locate the [facility] provider you are looking for under [facility] and page to a number that you can be directly reached. If you still have difficulty reaching the provider, please page the [facility] (Director on Call) for the Hospitalists listed on amion for assistance.

## 2022-10-26 NOTE — Progress Notes (Signed)
Inpatient Rehab Admissions Coordinator:  Saw pt and mother at bedside. [name] has now decided that pt pursuing CIR is not a feasible plan for them. She thinks pt needs a long term therapy venue. TOC made aware.  Wolfgang Phoenix, MS, CCC-SLP Admissions Coordinator [phone]

## 2022-10-27 DIAGNOSIS — M726 Necrotizing fasciitis: Secondary | ICD-10-CM | POA: Diagnosis not present

## 2022-10-27 LAB — CBC
HCT: 24.6 % — ABNORMAL LOW (ref 36.0–46.0)
Hemoglobin: 8 g/dL — ABNORMAL LOW (ref 12.0–15.0)
MCH: 30 pg (ref 26.0–34.0)
MCHC: 32.5 g/dL (ref 30.0–36.0)
MCV: 92.1 fL (ref 80.0–100.0)
Platelets: 410 10*3/uL — ABNORMAL HIGH (ref 150–400)
RBC: 2.67 MIL/uL — ABNORMAL LOW (ref 3.87–5.11)
RDW: 15.6 % — ABNORMAL HIGH (ref 11.5–15.5)
WBC: 33.8 10*3/uL — ABNORMAL HIGH (ref 4.0–10.5)
nRBC: 0.1 % (ref 0.0–0.2)

## 2022-10-27 LAB — GLUCOSE, CAPILLARY
Glucose-Capillary: 108 mg/dL — ABNORMAL HIGH (ref 70–99)
Glucose-Capillary: 121 mg/dL — ABNORMAL HIGH (ref 70–99)
Glucose-Capillary: 137 mg/dL — ABNORMAL HIGH (ref 70–99)
Glucose-Capillary: 144 mg/dL — ABNORMAL HIGH (ref 70–99)
Glucose-Capillary: 150 mg/dL — ABNORMAL HIGH (ref 70–99)
Glucose-Capillary: 176 mg/dL — ABNORMAL HIGH (ref 70–99)

## 2022-10-27 MED ORDER — OMEPRAZOLE 20 MG PO CPDR
40.0000 mg | DELAYED_RELEASE_CAPSULE | Freq: Every day | ORAL | Status: DC
  Administered 2022-10-28 – 2022-11-22 (×23): 40 mg via ORAL
  Filled 2022-10-27 (×27): qty 2

## 2022-10-27 MED ORDER — ACETAMINOPHEN 325 MG PO TABS
650.0000 mg | ORAL_TABLET | Freq: Four times a day (QID) | ORAL | Status: DC | PRN
  Filled 2022-10-27: qty 2

## 2022-10-27 MED ORDER — TRAZODONE HCL 50 MG PO TABS
50.0000 mg | ORAL_TABLET | Freq: Every evening | ORAL | Status: DC | PRN
  Administered 2022-10-29 – 2022-11-21 (×6): 50 mg via ORAL
  Filled 2022-10-27 (×7): qty 1

## 2022-10-27 MED ORDER — HEPARIN SODIUM (PORCINE) 5000 UNIT/ML IJ SOLN
5000.0000 [IU] | Freq: Three times a day (TID) | INTRAMUSCULAR | Status: DC
  Administered 2022-10-27 – 2022-11-22 (×71): 5000 [IU] via SUBCUTANEOUS
  Filled 2022-10-27 (×73): qty 1

## 2022-10-27 MED ORDER — METOPROLOL TARTRATE 5 MG/5ML IV SOLN
5.0000 mg | INTRAVENOUS | Status: DC | PRN
  Administered 2022-10-27 – 2022-11-01 (×5): 5 mg via INTRAVENOUS
  Filled 2022-10-27 (×5): qty 5

## 2022-10-27 MED ORDER — DOCUSATE SODIUM 100 MG PO CAPS
100.0000 mg | ORAL_CAPSULE | Freq: Every day | ORAL | Status: DC
  Filled 2022-10-27 (×2): qty 1

## 2022-10-27 NOTE — Progress Notes (Signed)
Nutrition Follow-up  DOCUMENTATION CODES:   Not applicable  INTERVENTION:   -D/c Bantrol -Double protein portions with meals -Magic cup TID with meals, each supplement provides 290 kcal and 9 grams of protein  -Continue 1000 mg vitamin C daily -Continue 220 mg zinc sulfate daily x 14 days -Continue 1 packet Juven BID, each packet provides 95 calories, 2.5 grams of protein (collagen), and 9.8 grams of carbohydrate (3 grams sugar); also contains 7 grams of L-arginine and L-glutamine, 300 mg vitamin C, 15 mg vitamin E, 1.2 mcg vitamin B-12, 9.5 mg zinc, 200 mg calcium, and 1.5 g  Calcium Beta-hydroxy-Beta-methylbutyrate to support wound healing   NUTRITION DIAGNOSIS:   Increased nutrient needs related to wound healing as evidenced by estimated needs.  Ongoing  GOAL:   Patient will meet greater than or equal to 90% of their needs  Progressing   MONITOR:   PO intake, Supplement acceptance  REASON FOR ASSESSMENT:   Consult Enteral/tube feeding initiation and management  ASSESSMENT:   32 y.o. female admits related to leg swelling. PMH includes: acute H. Pylori gastric ulcer, DM, gastroparesis, DKA, GERD, HTN, hyperthyroidism, normocytic anemia. Pt is currently receiving medical management related to sepsis due to cellulitis.  9/6- s/p bilateral BKA, applications of wound vac x 2, application of stump shrinker, aspiration of lt knee; advanced to full liquid diet 9/8- advanced to soft diet   Reviewed I/O's: 2.9 L x 24 hours and -10.7 L since 10/13/22  UOP: 3.2 L x 24 hours  Drain output: 50 ml x 24 hours   Case dicussed with RN and MD. RN reports that pt is eating very well, but is not consistent with supplements. Meal completions noted at 100%. Pt desiring to have cortrak out as mom states that pt is now eating 100% of her meals and also complains of discomfort when trying to eat. Noted TF formula was d/c on 10/26/22.   Per MD, plan to d/c cortrak today.   Per chart review,  plan for SNF placement once medically stable.   Medications reviewed and include vitamin C, colace, banatrol, folic acid, lasix, reglan, juven, and zinc sulfate.   Labs reviewed: CBGS: 121-270 (inpatient orders for glycemic control are 0-15 units insulin aspart every 4 hours).    Diet Order:   Diet Order             DIET SOFT Room service appropriate? Yes; Fluid consistency: Thin  Diet effective now                   EDUCATION NEEDS:   Not appropriate for education at this time  Skin:  Skin Assessment: Skin Integrity Issues: Skin Integrity Issues:: Stage II, Incisions, Wound VAC Stage II: rt elbow Wound Vac: closed rt and lt legs s/p bilateral BKA Incisions: closed rt and lt legs s/p bilateral BKA Other: -  Last BM:  10/26/22 (type 7)  Height:   Ht Readings from Last 1 Encounters:  10/08/22 5\' 7"  (1.702 m)    Weight:   Wt Readings from Last 1 Encounters:  10/24/22 99 kg    Ideal Body Weight:  53.8 kg (adjusted for bilateral BKAs)  BMI:  Body mass index is 34.18 kg/m.  Estimated Nutritional Needs:   Kcal:  1950-2150  Protein:  120-135 grams  Fluid:  > 2 L    Levada Schilling, RD, LDN, CDCES Registered Dietitian II Certified Diabetes Care and Education Specialist Please refer to [facility] for RD and/or RD on-call/weekend/after hours pager

## 2022-10-27 NOTE — TOC Initial Note (Addendum)
Transition of Care [facility]) - Initial/Assessment Note    Patient Details  Name: Kelli Hudson MRN: [id number] Date of Birth: Mar 12, 1990  Transition of Care [facility]) CM/SW Contact:    Lorri Frederick, LCSW Phone Number: 10/27/2022, 2:52 PM  Clinical Narrative: CSW met with pt and mother Robbieto discuss DC plan.  Pt boyfriend and another family member also present.  Permission given to discuss plan with everyone present.  Pt somewhat distracted throughout.  Discussed initial CIR recommendation and they confirmed they want to pursue SNF at [facility].  Several family members work at [employer].  Pt lives in apartment with boyfriend, no current services.  Permission given to send referral to [facility]. Pt recently approved for disability, will have medicare benefits as of 12/1, per mother.    Referral sent to [facility], who responds quickly and cannot offer bed, says medicaid likely will not cover SNF.       CSW updated [name] and she is agreeable that referral be sent out to other facilities.  PASSR requires additional info, which was uploaded to [facility] Must.       Message with Whitney[facility].  She also reports medicaid would not cover due to pt not being on disability.  Discussed that pt reportedly is recently approved for disability and per Whitney, this is not yet showing in [facility] tracks, she will need a letter or something to verify.  CSW spoke with [name] again, she does have letter and a medicare card and will bring these to the hospital.          Expected Discharge Plan: Skilled Nursing Facility Barriers to Discharge: Continued Medical Work up, SNF Pending bed offer   Patient Goals and CMS Choice Patient states their goals for this hospitalization and ongoing recovery are:: walking   Choice offered to / list presented to : Patient, Parent (mother [name])      Expected Discharge Plan and Services In-house Referral: Clinical Social Work   Post Acute Care Choice: Skilled  Nursing Facility Living arrangements for the past 2 months: Apartment                                      Prior Living Arrangements/Services Living arrangements for the past 2 months: Apartment Lives with:: Significant Other (boyfriend) Patient language and need for interpreter reviewed:: Yes Do you feel safe going back to the place where you live?: Yes      Need for Family Participation in Patient Care: Yes (Comment) Care giver support system in place?: Yes (comment) Current home services: Other (comment) (none) Criminal Activity/Legal Involvement Pertinent to Current Situation/Hospitalization: No - Comment as needed  Activities of Daily Living Home Assistive Devices/Equipment: None ADL Screening (condition at time of admission) Patient's cognitive ability adequate to safely complete daily activities?: Yes Is the patient deaf or have difficulty hearing?: No Does the patient have difficulty seeing, even when wearing glasses/contacts?: No Does the patient have difficulty concentrating, remembering, or making decisions?: No Patient able to express need for assistance with ADLs?: Yes Does the patient have difficulty dressing or bathing?: No Independently performs ADLs?: Yes (appropriate for developmental age) Does the patient have difficulty walking or climbing stairs?: No Weakness of Legs: Both Weakness of Arms/Hands: None  Permission Sought/Granted Permission sought to share information with : Family Supports Permission granted to share information with : Yes, Verbal Permission Granted  Share Information with NAME: mother [name]  Permission granted to share info w AGENCY: SNF     Permission granted to share info w Contact Information: Lynnea Ferrier  Emotional Assessment Appearance:: Appears stated age Attitude/Demeanor/Rapport: Lethargic Affect (typically observed): Unable to Assess Orientation: : Oriented to Self, Oriented to Place, Oriented to  Time, Oriented to  Situation   Psych Involvement: No (comment)  Admission diagnosis:  Sepsis (HCC) [A41.9] Cellulitis of lower extremity, unspecified laterality [L03.119] Anemia, unspecified type [D64.9] Sepsis, due to unspecified organism, unspecified whether acute organ dysfunction present [facility]) [A41.9] Necrotizing fasciitis (HCC) [M72.6] Patient Active Problem List   Diagnosis Date Noted   MDD (major depressive disorder), recurrent episode, moderate (HCC) 10/16/2022   Necrotizing fasciitis of pelvic region and thigh (HCC) 10/08/2022   Necrotizing fasciitis of lower leg (HCC) 10/08/2022   Necrotizing fasciitis (HCC) 10/08/2022   Sepsis due to cellulitis (HCC) 10/03/2022   Cellulitis of lower extremity 10/03/2022   Multinodular goiter 06/18/2022   Malnutrition of moderate degree 04/18/2022   Intractable vomiting with nausea 04/17/2022   DKA (diabetic ketoacidosis) (HCC) 03/04/2022   Refractory nausea and vomiting 12/25/2021   Diabetic gastroparesis (HCC) 12/07/2021   Intractable nausea and vomiting 10/09/2021   Drug-seeking behavior 09/21/2021   Essential hypertension 08/25/2021   GERD without esophagitis 08/25/2021   Hematemesis    Prolonged Q-T interval on ECG    Nausea and vomiting 07/20/2021   Hypomagnesemia 04/21/2021   Ulcerated, foot, left, with fat layer exposed (HCC) 04/20/2021   Uncontrolled type 1 diabetes mellitus with hyperglycemia, with long-term current use of insulin (HCC) 12/18/2020   DM type 1 (diabetes mellitus, type 1) (HCC) 12/18/2020   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) 11/09/2020   Diabetic retinopathy of both eyes associated with type 1 diabetes mellitus (HCC) 09/24/2020   Diabetic gastroparesis associated with type 1 diabetes mellitus (HCC) 08/12/2020   AKI (acute kidney injury) (HCC) 07/25/2020   Generalized abdominal pain 07/23/2020   Diabetic nephropathy associated with type 1 diabetes mellitus (HCC) 06/27/2020   Sepsis (HCC)  05/27/2020   HLD (hyperlipidemia) 05/23/2020   Sinus tachycardia 05/09/2020   Anemia 04/20/2020   Depression with anxiety 07/05/2019   Diabetic polyneuropathy associated with type 1 diabetes mellitus (HCC) 09/24/2017   Gastroparesis    UTI (urinary tract infection) 06/14/2011   Goiter 10/05/2009   Hypertension associated with diabetes (HCC) 10/05/2009   PCP:  Norm Salt, PA Pharmacy:   [facility] [phone] - [place], [place] - [address] [place] [address] Phone: [phone] Fax: [phone]  [place] [facility] [address] [place] [address] Phone: [phone] Fax: [phone]  [facility] [address] [place] [address] Phone: [phone] Fax: [phone]     Social Determinants of Health (SDOH) Social History: SDOH Screenings   Food Insecurity: No Food Insecurity (10/03/2022)  Housing: Low Risk  (10/03/2022)  Transportation Needs: No Transportation Needs (10/03/2022)  Utilities: Not At Risk (10/03/2022)  Alcohol Screen: Low Risk  (05/29/2021)  Depression (PHQ2-9): Low Risk  (05/29/2021)  Recent Concern: Depression (PHQ2-9) - Medium Risk (04/10/2021)  Financial Resource Strain: Low Risk  (02/13/2022)   Received from [facility]  Social Connections: Unknown (06/17/2021)   Received from [facility], [facility]  Stress: No Stress Concern Present (09/16/2022)   Received from [facility]  Tobacco Use: Low Risk  (10/24/2022)   SDOH Interventions:     Readmission Risk Interventions  05/08/2022    9:45 AM 03/24/2022    3:33 PM 12/12/2021    8:59 AM  Readmission Risk Prevention Plan  Transportation Screening Complete Complete Complete  Medication Review (RN Care Manager) Complete Complete Complete  PCP or Specialist appointment within 3-5 days of discharge Complete Complete Complete  HRI or Home Care Consult  Complete Complete Complete  SW Recovery Care/Counseling Consult Complete Complete Complete  Palliative Care Screening Not Applicable Not Applicable Not Applicable  Skilled Nursing Facility Not Applicable Not Applicable Not Applicable

## 2022-10-27 NOTE — Progress Notes (Signed)
RE:  Kelli Hudson      Date of Birth: 01-Feb-1991      Date:   10/27/22       To Whom It May Concern:  Please be advised that the above-named patient will require a short-term nursing home stay - anticipated 30 days or less for rehabilitation and strengthening.  The plan is for return home.                 MD signature                Date

## 2022-10-27 NOTE — Progress Notes (Signed)
PROGRESS NOTE    Kelli Hudson  WUJ:[id number] DOB: 03-Mar-1990 DOA: 10/03/2022 PCP: Norm Salt, PA    Brief Narrative:  32 y.o. female with medical history significant of DM1, diabetic gastroparesis with very frequent admission in the past, HTN presented w/ c/o B foot wounds, fever, and pain and informed that she has been dealing with lower extremity wound for past 2 weeks ,followed with her podiatrist in the outpatient setting at [facility] there which she recently finished a course of doxycycline which did not help her symptoms, initially had a blister that ruptured subsequently with redness streaking up her legs and noticing fever chills worsening pain in last few days.    Patient admitted for sepsis due to cellulitis/lower extremity wound, severe anemia, AKI. Podiatry consulted- Unable to complete MRI as gastric pacemaker is not MRI safe.  ID and Ortho were consulted.  Patient went to the OR for bilateral lower extremity necrotizing fasciitis feet to his thigh, remained intubated and required PRBC and FFP transfusion during the case on 8/21.  Eventually extubated on 8/23 and was taken back to the OR on 8/26 for wound VAC change.  Patient has remained on IV antibiotics.  Also underwent bilateral lower extremity BKA by Dr. Lajoyce Corners on 9/6.    Assessment & Plan:   Principal Problem:   Necrotizing fasciitis (HCC) Active Problems:   AKI (acute kidney injury) (HCC)   Sepsis due to cellulitis (HCC)   Cellulitis of lower extremity   Anemia   Diabetic gastroparesis associated with type 1 diabetes mellitus (HCC)   DM type 1 (diabetes mellitus, type 1) (HCC)   Drug-seeking behavior   Sepsis (HCC)   Necrotizing fasciitis of pelvic region and thigh (HCC)   Necrotizing fasciitis of lower leg (HCC)   MDD (major depressive disorder), recurrent episode, moderate (HCC)   Severe sepsis due to bilateral feet abscess, necrotizing fasciitis, POA.  MRSE, sensitive Pseudomonas S/p  bilateral excisional leg and thigh debridement Initially underwent I&D on 8/21 with wound VAC change on 8/26 but ultimately required bilateral BKA by orthopedic on 9/6. Current antibiotics-meropenem/linezolid. Will discuss with ID to finalize Abx plans, as OR cultures remain neg.    Acute blood loss anemia due to operative blood loss Secondary to blood loss.  Required multiple units of PRBC and FFP.   Type 1 diabetes, poorly controlled with hyperglycemia and complicated with gastropathy Continue current sliding scale and Accu-Chek.  Overall appears to have better control for now   Acute kidney injury on CKD stage IIIa due to septic ATN, improving Non-anion gap metabolic acidosis Hyponatremia, euvolemic Resolved now back to baseline creatinine 1.4.  Peaked at 2.54   Severe protein calorie malnutrition Really wants oral diet and tells me she is not eating drinking enough as cortrak is on her way.  Will remove this.  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    Bilateral upper extremity edema:  Advised to elevate extremities.  On Lasix     DVT prophylaxis: SCD   Code Status: Full Code  Family Communication: None   Status is: Inpatient Remains inpatient appropriate because: Recovering from surgery           Subjective: Seen at bedside, poor oral intake this morning as she thought cortrak is in her way to allow her to drink  and eat properly.  She would like this to be removed   Examination:  General exam: Appears calm and comfortable  Respiratory system: Clear to auscultation. Respiratory effort normal. Cardiovascular system: S1 & S2 heard, RRR. No JVD, murmurs, rubs, gallops or clicks. No pedal edema. Gastrointestinal system: Abdomen is nondistended, soft and nontender. No organomegaly or masses  felt. Normal bowel sounds heard. Central nervous system: Alert and oriented. No focal neurological deficits. Extremities: Bilateral lower,  BKA noted Skin: Surgical dressing noted Psychiatry: Judgement and insight appear normal. Mood & affect appropriate.   Pressure Injury 10/07/22 Elbow Posterior;Right Stage 2 -  Partial thickness loss of dermis presenting as a shallow open injury with a red, pink wound bed without slough. (Active)  10/07/22 1226  Location: Elbow  Location Orientation: Posterior;Right  Staging: Stage 2 -  Partial thickness loss of dermis presenting as a shallow open injury with a red, pink wound bed without slough.  Wound Description (Comments):   Present on Admission:      Diet Orders (From admission, onward)     Start     Ordered   10/26/22 1234  DIET SOFT Room service appropriate? Yes; Fluid consistency: Thin  Diet effective now       Question Answer Comment  Room service appropriate? Yes   Fluid consistency: Thin      10/26/22 1233            Objective: Vitals:   10/26/22 1328 10/26/22 2044 10/27/22 0426 10/27/22 0914  BP: (!) 152/93 136/80 (!) 169/99 (!) 143/78  Pulse: (!) 104 100 (!) 110 (!) 108  Resp: 18 19 17 18   Temp: 98.2 F (36.8 C) 98.4 F (36.9 C) 98 F (36.7 C) 98.5 F (36.9 C)  TempSrc: Oral Oral  Oral  SpO2: 100% 100% 100% 100%  Weight:      Height:        Intake/Output Summary (Last 24 hours) at 10/27/2022 1113 Last data filed at 10/27/2022 0914 Gross per 24 hour  Intake 280 ml  Output 3150 ml  Net -2870 ml   Filed Weights   10/21/22 0500 10/22/22 0500 10/24/22 0500  Weight: 99.1 kg 99 kg 99 kg    Scheduled Meds:  acetaminophen  1,000 mg Oral Q6H   vitamin C  1,000 mg Oral Daily   Chlorhexidine Gluconate Cloth  6 each Topical Daily   docusate  100 mg Oral Daily   DULoxetine  30 mg Oral Daily   fiber supplement (BANATROL TF)  60 mL Oral BID   folic acid  1 mg Oral Daily   furosemide  40 mg Oral Daily   gabapentin   300 mg Oral Q8H   insulin aspart  0-15 Units Subcutaneous Q4H   linezolid  600 mg Oral Q12H   methocarbamol  1,000 mg Oral TID   metoCLOPramide  10 mg Oral Q8H   multivitamin with minerals  1 tablet Oral Daily   nutrition supplement (JUVEN)  1 packet Oral BID BM   omeprazole  40 mg Oral Daily   mouth rinse  15 mL Mouth Rinse 4 times per day   silver nitrate applicators  1 Application Topical Once   sodium chloride flush  10-40 mL Intracatheter Q12H   zinc sulfate  220 mg Oral Daily   Continuous Infusions:  sodium chloride 10 mL/hr at 10/25/22 0517   magnesium sulfate bolus IVPB     meropenem (MERREM) IV 1 g (10/27/22 0553)    Nutritional status Signs/Symptoms: estimated  needs Interventions: Juven, Glucerna shake Body mass index is 34.18 kg/m.  Data Reviewed:   CBC: Recent Labs  Lab 10/23/22 0415 10/24/22 0327 10/25/22 0344 10/26/22 0310 10/27/22 0415  WBC 43.9* 33.8* 27.6* 32.3* 33.8*  HGB 10.2* 8.7* 7.9* 7.2* 8.0*  HCT 31.0* 26.9* 24.3* 22.2* 24.6*  MCV 90.6 93.1 92.7 90.2 92.1  PLT 650* 546* 394 361 410*   Basic Metabolic Panel: Recent Labs  Lab 10/21/22 0246 10/22/22 0511 10/23/22 0415 10/24/22 0327 10/25/22 0344 10/26/22 0310  NA 139 141 139 132* 130* 132*  K 4.6 4.2 4.2 4.6 4.4 4.7  CL 110 115* 113* 108 106 107  CO2 18* 18* 20* 19* 19* 19*  GLUCOSE 190* 124* 123* 152* 92 120*  BUN 89* 87* 84* 80* 68* 50*  CREATININE 2.38* 2.16* 2.04* 1.82* 1.63* 1.48*  CALCIUM 8.3* 8.4* 8.7* 7.9* 7.3* 7.3*  MG 2.1  --  2.1 1.9  --   --   PHOS 3.6  --   --  3.5  --   --    GFR: Estimated Creatinine Clearance: 66.6 mL/min (A) (by C-G formula based on SCr of 1.48 mg/dL (H)). Liver Function Tests: No results for input(s): "AST", "ALT", "ALKPHOS", "BILITOT", "PROT", "ALBUMIN" in the last 168 hours. No results for input(s): "LIPASE", "AMYLASE" in the last 168 hours. No results for input(s): "AMMONIA" in the last 168 hours. Coagulation Profile: Recent Labs  Lab  10/22/22 0511  INR 1.6*   Cardiac Enzymes: No results for input(s): "CKTOTAL", "CKMB", "CKMBINDEX", "TROPONINI" in the last 168 hours. BNP (last 3 results) No results for input(s): "PROBNP" in the last 8760 hours. HbA1C: No results for input(s): "HGBA1C" in the last 72 hours. CBG: Recent Labs  Lab 10/26/22 1610 10/26/22 2002 10/27/22 0002 10/27/22 0424 10/27/22 0729  GLUCAP 150* 142* 150* 137* 121*   Lipid Profile: No results for input(s): "CHOL", "HDL", "LDLCALC", "TRIG", "CHOLHDL", "LDLDIRECT" in the last 72 hours. Thyroid Function Tests: No results for input(s): "TSH", "T4TOTAL", "FREET4", "T3FREE", "THYROIDAB" in the last 72 hours. Anemia Panel: No results for input(s): "VITAMINB12", "FOLATE", "FERRITIN", "TIBC", "IRON", "RETICCTPCT" in the last 72 hours. Sepsis Labs: No results for input(s): "PROCALCITON", "LATICACIDVEN" in the last 168 hours.  Recent Results (from the past 240 hour(s))  C Difficile Quick Screen (NO PCR Reflex)     Status: None   Collection Time: 10/17/22  1:22 PM   Specimen: STOOL  Result Value Ref Range Status   C Diff antigen NEGATIVE NEGATIVE Final   C Diff toxin NEGATIVE NEGATIVE Final   C Diff interpretation No C. difficile detected.  Final    Comment: Performed at [facility], [address], [place], [place] [address]  Surgical pcr screen     Status: None   Collection Time: 10/23/22  9:05 PM   Specimen: Nasal Mucosa; Nasal Swab  Result Value Ref Range Status   MRSA, PCR NEGATIVE NEGATIVE Final   Staphylococcus aureus NEGATIVE NEGATIVE Final    Comment: (NOTE) The Xpert SA Assay (FDA approved for NASAL specimens in patients 58 years of age and older), is one component of a comprehensive surveillance program. It is not intended to diagnose infection nor to guide or monitor treatment. Performed at [facility], [address], [place], [place] [address]   Aerobic/Anaerobic Culture w Gram Stain (surgical/deep wound)      Status: None (Preliminary result)   Collection Time: 10/24/22  2:43 PM   Specimen: Path fluid; Body Fluid  Result Value Ref Range Status   Specimen Description FLUID  Final   Special Requests right knee joint fluid  Final   Gram Stain   Final    RARE WBC PRESENT, PREDOMINANTLY PMN NO ORGANISMS SEEN    Culture   Final    NO GROWTH 3 DAYS NO ANAEROBES ISOLATED; CULTURE IN PROGRESS FOR 5 DAYS Performed at [facility], [address] [place], [place] [address]    Report Status PENDING  Incomplete         Radiology Studies: No results found.         LOS: 24 days   Time spent= 35 mins    Miguel Rota, MD [facility]  If 7PM-7AM, please contact night-coverage  10/27/2022, 11:13 AM

## 2022-10-27 NOTE — Plan of Care (Signed)
  Problem: Pain Managment: Goal: General experience of comfort will improve Outcome: Progressing   Problem: Safety: Goal: Ability to remain free from injury will improve Outcome: Progressing   Problem: Skin Integrity: Goal: Risk for impaired skin integrity will decrease Outcome: Progressing   

## 2022-10-27 NOTE — Inpatient Diabetes Management (Signed)
Inpatient Diabetes Program Recommendations  AACE/ADA: New Consensus Statement on Inpatient Glycemic Control (2015)  Target Ranges:  Prepandial:   less than 140 mg/dL      Peak postprandial:   less than 180 mg/dL (1-2 hours)      Critically ill patients:  140 - 180 mg/dL   Lab Results  Component Value Date   GLUCAP 121 (H) 10/27/2022   HGBA1C 8.8 (A) 06/18/2022    Review of Glycemic Control  Latest Reference Range & Units 10/26/22 07:36 10/26/22 11:06 10/26/22 16:10 10/26/22 20:02 10/27/22 00:02 10/27/22 04:24 10/27/22 07:29  Glucose-Capillary 70 - 99 mg/dL 253 (H) 664 (H) 403 (H) 142 (H) 150 (H) 137 (H) 121 (H)   Diabetes history: DM type 1 Outpatient Diabetes medications: Levemir 8 units bid, Novolin R 1-10 units 4 times daily  Current orders for Inpatient glycemic control:  Novolog 0-15 units q4 hours  A1c 8.8% on 5/1 Consult for new bilateral amputations with a pt with type 1 diabetes.  Note: Diabetes Coordinators are aware of this pt and have been following pt since admission on 8/16. Glucose trends mostly within inpatient goal for glucose control. Will continue to follow glucose trends while here. Pt sees Endocrinology outpatient for glucose control. Pt to follow up with Dr. Lonzo Cloud after discharged for continued management.  Thanks,  Christena Deem RN, MSN, BC-ADM Inpatient Diabetes Coordinator Team Pager [phone] (8a-5p)

## 2022-10-27 NOTE — Progress Notes (Signed)
Patient ID: Kelli Hudson, female   DOB: [birth year], 32 y.o.   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  There is no new drainage in the wound VAC canisters.  Currently 100 cc in the right canister.  Patient still has an elevated white cell count of 33.  Will discontinue the wound vacs at discharge.

## 2022-10-27 NOTE — Progress Notes (Signed)
Occupational Therapy Treatment Patient Details Name: Kelli Hudson First MRN: [id number] DOB: [birth year] Today's Date: 10/27/2022   History of present illness Patient is 32 y.o. female presented to hospital 10/03/22 for bil foot wounds, fever, and pain for ~2 weeks with no improvement with antibiotics. Pt admitted for sepsis with necrotizing fasciitis/gangrene bilateral feet elected for surgical management. Pt underwent excision and debridement of BLE extending feet to thighs on 10/08/22 and remained intubated in ICU. 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 PMH significant for DM, HTN, gastric pacemaker.   OT comments  Pt readily willing to work with therapies on OOB to chair, but admittedly fearful of falling. Educated in A-P transfer bed to chair, pt completed with min assist and verbal cues. Instructed in chair pushups for strengthening between therapy sessions. Completed grooming in chair with set up. Updated d/c disposition at pt and family's request. Patient will benefit from continued inpatient follow up therapy, <3 hours/day.       If plan is discharge home, recommend the following:  A lot of help with walking and/or transfers;A lot of help with bathing/dressing/bathroom;Assistance with cooking/housework;Assist for transportation;Help with stairs or ramp for entrance   Equipment Recommendations  Wheelchair (measurements OT);Wheelchair cushion (measurements OT);Other (comment) (drop arm commode)    Recommendations for Other Services      Precautions / Restrictions Precautions Precautions: Fall Precaution Comments: bil wound vac, cortrak Restrictions Weight Bearing Restrictions: Yes RLE Weight Bearing: Non weight bearing LLE Weight Bearing: Non weight bearing       Mobility Bed Mobility Overal bed mobility: Needs Assistance Bed Mobility: Supine to Sit     Supine to sit: Contact guard     General bed mobility comments: HOB up    Transfers Overall  transfer level: Needs assistance   Transfers: Bed to chair/wheelchair/BSC         Anterior-Posterior transfers: Min assist   General transfer comment: verbal cues for A-P technique and use of bed pad under hips to assist pt bed to chair     Balance Overall balance assessment: Needs assistance   Sitting balance-Leahy Scale: Fair                                     ADL either performed or assessed with clinical judgement   ADL Overall ADL's : Needs assistance/impaired Eating/Feeding: Independent;Sitting   Grooming: Wash/dry face;Oral care;Sitting;Set up                                      Extremity/Trunk Assessment              Vision       Perception     Praxis      Cognition Arousal: Alert Behavior During Therapy: Flat affect Overall Cognitive Status: Within Functional Limits for tasks assessed                                 General Comments: pt with minimal insight into long term accessible housing needs        Exercises Exercises: Other exercises Other Exercises Other Exercises: chair pushups x 5    Shoulder Instructions       General Comments      Pertinent Vitals/ Pain  Pain Assessment Pain Assessment: Faces Faces Pain Scale: Hurts a little bit Pain Location: bil LE with movement Pain Descriptors / Indicators: Grimacing Pain Intervention(s): Monitored during session, Repositioned  Home Living                                          Prior Functioning/Environment              Frequency  Min 1X/week        Progress Toward Goals  OT Goals(current goals can now be found in the care plan section)  Progress towards OT goals: Progressing toward goals  Acute Rehab OT Goals OT Goal Formulation: With patient Time For Goal Achievement: 11/08/22 Potential to Achieve Goals: Good  Plan      Co-evaluation    PT/OT/SLP Co-Evaluation/Treatment: Yes Reason for  Co-Treatment: For patient/therapist safety   OT goals addressed during session: ADL's and self-care      AM-PAC OT "6 Clicks" Daily Activity     Outcome Measure   Help from another person eating meals?: None Help from another person taking care of personal grooming?: A Little Help from another person toileting, which includes using toliet, bedpan, or urinal?: A Lot Help from another person bathing (including washing, rinsing, drying)?: A Little Help from another person to put on and taking off regular upper body clothing?: A Little Help from another person to put on and taking off regular lower body clothing?: A Lot 6 Click Score: 17    End of Session    OT Visit Diagnosis: Unsteadiness on feet (R26.81);Muscle weakness (generalized) (M62.81)   Activity Tolerance Patient tolerated treatment well   Patient Left in chair;with call bell/phone within reach;with chair alarm set;with family/visitor present   Nurse Communication Mobility status        Time: 9604-5409 OT Time Calculation (min): 48 min  Charges: OT General Charges $OT Visit: 1 Visit OT Treatments $Self Care/Home Management : 8-22 mins Berna Spare, OTR/L Acute Rehabilitation Services Office: [phone]   Evern Bio 10/27/2022, 11:58 AM

## 2022-10-27 NOTE — Progress Notes (Addendum)
Pt took AM meds then threw up - pink colored d/t liquid omeprazole - some meds present, unable to identify. Pt asking if cortrak can be taken out as she states that is what caused her to throw the meds up. Pt declined anti-nausea medication. Stephania Fragmin, MD made aware and to evaluate pt at bedside. Will resume administration of meds at scheduled times. Stephania Fragmin, MD aware.

## 2022-10-27 NOTE — Progress Notes (Signed)
[facility]  Date of Admission:  10/03/2022      Total days of antibiotics: 24             ASSESSMENT: Kelli Hudson is a 32 y.o. female admitted with   Necrotizing Fasciitis B/L LEs - (PsA, MSSA/MRSE, diptheroids) - Chronic B/L foot wounds -  B/L Fasciotomies, thighs -  -has been on treatment with broad coverage linezolid + meropenem -Now s/p bilateral BKAs. Tissue margins described to be clear with healthy viable muscle. No further debridement was done to thigh wounds since 8/30 (no growth).  -with control over necrotizing and now s/p 9d from thigh debridement and > 48h after BKAs we think she has achieved enough antibiotic coverage.  -Will stop and continue to follow closely.    Leukocytosis -  -still elevated but improved from last week. Would suspect she has slow resolution given her marked leukemoid reaction initially.  -repeat CRP / ESR in AM   AKI -  Lab Results  Component Value Date   CREATININE 1.48 (H) 10/26/2022   CREATININE 1.63 (H) 10/25/2022   CREATININE 1.82 (H) 10/24/2022  - improving  L Knee Effusion -  -she reports no stiffness or pani here - aspirated in OR on 9/6, sent for cultures only and no growth to date. No cell count but also based on fluid appearance no clinical concern for infection and seemed more reactive.  -will need to keep an eye on this after stopping abx.    Medication Monitoring -  -Hgb 8.0, Thrombocytosis improved (only just above upper margin)   D/W [name] and family in the room.    PLAN: Stop antibiotics  CRP / ESR in AM  Follow closely with fasciotomy dressing changes (plan to D/C wound vac at discharge) Discharge date TBD - rehab consult pending given new amputation    Principal Problem:   Necrotizing fasciitis (HCC) Active Problems:   Anemia   Sepsis (HCC)   AKI (acute kidney injury) (HCC)   Diabetic gastroparesis associated with type 1 diabetes mellitus (HCC)   DM  type 1 (diabetes mellitus, type 1) (HCC)   Drug-seeking behavior   Sepsis due to cellulitis (HCC)   Cellulitis of lower extremity   Necrotizing fasciitis of pelvic region and thigh (HCC)   Necrotizing fasciitis of lower leg (HCC)   MDD (major depressive disorder), recurrent episode, moderate (HCC)    acetaminophen  1,000 mg Oral Q6H   vitamin C  1,000 mg Oral Daily   Chlorhexidine Gluconate Cloth  6 each Topical Daily   docusate  100 mg Oral Daily   DULoxetine  30 mg Oral Daily   fiber supplement (BANATROL TF)  60 mL Oral BID   folic acid  1 mg Oral Daily   furosemide  40 mg Oral Daily   gabapentin  300 mg Oral Q8H   insulin aspart  0-15 Units Subcutaneous Q4H   linezolid  600 mg Oral Q12H   methocarbamol  1,000 mg Oral TID   metoCLOPramide  10 mg Oral Q8H   multivitamin with minerals  1 tablet Oral Daily   nutrition supplement (JUVEN)  1 packet Oral BID BM   omeprazole  40 mg Oral Daily   mouth rinse  15 mL Mouth Rinse 4 times per day   silver nitrate applicators  1 Application Topical Once   sodium chloride flush  10-40 mL Intracatheter Q12H   zinc sulfate  220 mg Oral  Daily    SUBJECTIVE: She is feeling better. Still with some discomfort from the tight dressings and swelling in thighs but getting better. She is starting to slowly try to eat PO. She has no fevers / chills. Family is in the room with her.    Review of Systems: Review of Systems  Constitutional:  Negative for chills and fever.  Gastrointestinal:  Negative for abdominal pain, nausea and vomiting.     Allergies  Allergen Reactions   Ibuprofen Other (See Comments)    07/23/22 - Worsening kidney function after 3 doses of ibuprofen 400mg .  Creatinine increased from 1.3 to 2.  Creatinine returned to normal after stopping.   Lisinopril Cough, Other (See Comments), Itching and Rash    Breakout in rash    OBJECTIVE: Vitals:   10/26/22 1328 10/26/22 2044 10/27/22 0426 10/27/22 0914  BP: (!) 152/93 136/80 (!)  169/99 (!) 143/78  Pulse: (!) 104 100 (!) 110 (!) 108  Resp: 18 19 17 18   Temp: 98.2 F (36.8 C) 98.4 F (36.9 C) 98 F (36.7 C) 98.5 F (36.9 C)  TempSrc: Oral Oral  Oral  SpO2: 100% 100% 100% 100%  Weight:      Height:       Body mass index is 34.18 kg/m.  Physical Exam Constitutional:      Appearance: She is not ill-appearing.     Comments: Appears much more bright today.   HENT:     Nose: Nose normal.     Comments: Enteric feeding tube in place Eyes:     Pupils: Pupils are equal, round, and reactive to light.  Cardiovascular:     Rate and Rhythm: Normal rate.  Pulmonary:     Effort: Pulmonary effort is normal.     Breath sounds: Normal breath sounds.  Musculoskeletal:     Comments: Bilateral wound vacs with good seal. No induration noted to periwound from under opsite. Serosanguinous drainage.  She is edematous but all is cool to the touch and more c/w third spacing  Skin:    General: Skin is warm and dry.     Findings: No erythema or rash.  Neurological:     Mental Status: She is oriented to person, place, and time.     Lab Results Lab Results  Component Value Date   WBC 33.8 (H) 10/27/2022   HGB 8.0 (L) 10/27/2022   HCT 24.6 (L) 10/27/2022   MCV 92.1 10/27/2022   PLT 410 (H) 10/27/2022    Lab Results  Component Value Date   CREATININE 1.48 (H) 10/26/2022   BUN 50 (H) 10/26/2022   NA 132 (L) 10/26/2022   K 4.7 10/26/2022   CL 107 10/26/2022   CO2 19 (L) 10/26/2022    Lab Results  Component Value Date   ALT 18 10/14/2022   AST 23 10/14/2022   ALKPHOS 193 (H) 10/14/2022   BILITOT 0.4 10/14/2022     Microbiology: Recent Results (from the past 240 hour(s))  C Difficile Quick Screen (NO PCR Reflex)     Status: None   Collection Time: 10/17/22  1:22 PM   Specimen: STOOL  Result Value Ref Range Status   C Diff antigen NEGATIVE NEGATIVE Final   C Diff toxin NEGATIVE NEGATIVE Final   C Diff interpretation No C. difficile detected.  Final     Comment: Performed at [facility], [address], [place], [place] [address]  Surgical pcr screen     Status: None  Collection Time: 10/23/22  9:05 PM   Specimen: Nasal Mucosa; Nasal Swab  Result Value Ref Range Status   MRSA, PCR NEGATIVE NEGATIVE Final   Staphylococcus aureus NEGATIVE NEGATIVE Final    Comment: (NOTE) The Xpert SA Assay (FDA approved for NASAL specimens in patients 27 years of age and older), is one component of a comprehensive surveillance program. It is not intended to diagnose infection nor to guide or monitor treatment. Performed at [facility], [address], [place], [place] [address]   Aerobic/Anaerobic Culture w Gram Stain (surgical/deep wound)     Status: None (Preliminary result)   Collection Time: 10/24/22  2:43 PM   Specimen: Path fluid; Body Fluid  Result Value Ref Range Status   Specimen Description FLUID  Final   Special Requests right knee joint fluid  Final   Gram Stain   Final    RARE WBC PRESENT, PREDOMINANTLY PMN NO ORGANISMS SEEN    Culture   Final    NO GROWTH 3 DAYS NO ANAEROBES ISOLATED; CULTURE IN PROGRESS FOR 5 DAYS Performed at [facility], [address] [place], [place] [address]    Report Status PENDING  Incomplete    Rexene Alberts, MSN, NP-C [facility]  [email or web address] Pager: [phone] Office: [phone] [facility] Main Line: [phone] *Secure Chat Communication Welcome

## 2022-10-27 NOTE — Progress Notes (Signed)
Physical Therapy Treatment Patient Details Name: Kelli Hudson MRN: [id number] DOB: December 21, 1990 Today's Date: 10/27/2022   History of Present Illness Patient is 32 y.o. female presented to hospital 10/03/22 for bil foot wounds, fever, and pain for ~2 weeks with no improvement with antibiotics. Pt admitted for sepsis with necrotizing fasciitis/gangrene bilateral feet elected for surgical management. Pt underwent excision and debridement of BLE extending feet to thighs on 10/08/22 and remained intubated in ICU. 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 PMH significant for DM, HTN, gastric pacemaker.    PT Comments  Pt agreeable to getting up to chair with therapy, but voices concern about the scoot transfer during last session. Agreeable to working on AP transfer which may be better solution to get to wheelchair. Pt is mod I for bed mobility and min A for use of pad to pivot in bed to get to chair. Utilized chair push up to help in removing soiled pad from under her which was utilized in scoot to chair. Pt with decreased strength in UE and benefits from chair pushups to work on being able to clear hips from chair surface. Mother in room at end of session, discussed timing of recovery and is interested in [employer] for discharge location. Pt in agreement with [facility] discharge plan. PT will continue to follow acutely.    If plan is discharge home, recommend the following: Two people to help with walking and/or transfers;Two people to help with bathing/dressing/bathroom;Assistance with cooking/housework;Assist for transportation;Help with stairs or ramp for entrance;Supervision due to cognitive status   Can travel by private vehicle        Equipment Recommendations  Wheelchair (measurements PT);Wheelchair cushion (measurements PT);Other (comment) (TBD at next venue)    Recommendations for Other Services Rehab consult     Precautions / Restrictions  Precautions Precautions: Fall Precaution Comments: bil wound vac, cortrak Restrictions Weight Bearing Restrictions: Yes RLE Weight Bearing: Non weight bearing LLE Weight Bearing: Non weight bearing     Mobility  Bed Mobility Overal bed mobility: Needs Assistance Bed Mobility: Supine to Sit     Supine to sit: Contact guard     General bed mobility comments: HOB up    Transfers Overall transfer level: Needs assistance   Transfers: Bed to chair/wheelchair/BSC         Anterior-Posterior transfers: Min assist   General transfer comment: verbal cues for A-P technique and use of bed pad under hips to assist pt bed to chair          Balance Overall balance assessment: Needs assistance   Sitting balance-Leahy Scale: Fair                                      Cognition Arousal: Alert Behavior During Therapy: Flat affect Overall Cognitive Status: Within Functional Limits for tasks assessed                                 General Comments: pt with minimal insight into long term accessible housing needs        Exercises Other Exercises Other Exercises: chair pushups x 5    General Comments General comments (skin integrity, edema, etc.): mother present at end of session, questions about SNF rehab and level of independence at discharge      Pertinent Vitals/Pain Pain Assessment Pain  Assessment: Faces Faces Pain Scale: Hurts a little bit Pain Location: bil LE with movement Pain Descriptors / Indicators: Grimacing Pain Intervention(s): Limited activity within patient's tolerance, Monitored during session, Repositioned     PT Goals (current goals can now be found in the care plan section) Acute Rehab PT Goals PT Goal Formulation: With patient Time For Goal Achievement: 11/08/22 Potential to Achieve Goals: Good Progress towards PT goals: Progressing toward goals    Frequency    Min 1X/week           Co-evaluation  PT/OT/SLP Co-Evaluation/Treatment: Yes Reason for Co-Treatment: For patient/therapist safety PT goals addressed during session: Mobility/safety with mobility;Strengthening/ROM OT goals addressed during session: ADL's and self-care      AM-PAC PT "6 Clicks" Mobility   Outcome Measure  Help needed turning from your back to your side while in a flat bed without using bedrails?: A Lot Help needed moving from lying on your back to sitting on the side of a flat bed without using bedrails?: Total Help needed moving to and from a bed to a chair (including a wheelchair)?: Total Help needed standing up from a chair using your arms (e.g., wheelchair or bedside chair)?: Total Help needed to walk in hospital room?: Total Help needed climbing 3-5 steps with a railing? : Total 6 Click Score: 7    End of Session   Activity Tolerance: Patient tolerated treatment well Patient left: in chair;with call bell/phone within reach;with chair alarm set;with family/visitor present Nurse Communication: Mobility status;Need for lift equipment (2+) PT Visit Diagnosis: Muscle weakness (generalized) (M62.81);Difficulty in walking, not elsewhere classified (R26.2);Other symptoms and signs involving the nervous system (R29.898);Other abnormalities of gait and mobility (R26.89);Unsteadiness on feet (R26.81);Pain Pain - Right/Left:  (bil) Pain - part of body: Leg     Time: 1610-9604 PT Time Calculation (min) (ACUTE ONLY): 57 min  Charges:    $Therapeutic Exercise: 8-22 mins $Therapeutic Activity: 8-22 mins PT General Charges $$ ACUTE PT VISIT: 1 Visit                     Nolberto Cheuvront B. Beverely Risen PT, DPT Acute Rehabilitation Services Please use secure chat or  Call Office [phone]    [name] 10/27/2022, 1:32 PM

## 2022-10-27 NOTE — [place] FL2 (Signed)
[place] MEDICAID FL2 LEVEL OF CARE FORM     IDENTIFICATION  Patient Name: Kelli Hudson Birthdate: 05/04/1990 Sex: female Admission Date (Current Location): 10/03/2022  [place] and [place] Number:  Haynes Bast [id number] Facility and Address:  [facility], [address], [place], [place] [address]      Provider Number: [id number]  Attending Physician Name and Address:  Miguel Rota, MD  Relative Name and Phone Number:  Wynelle Beckmann Mother [phone]    Current Level of Care: Hospital Recommended Level of Care: Skilled Nursing Facility Prior Approval Number:    Date Approved/Denied:   PASRR Number:    Discharge Plan: SNF    Current Diagnoses: Patient Active Problem List   Diagnosis Date Noted   MDD (major depressive disorder), recurrent episode, moderate (HCC) 10/16/2022   Necrotizing fasciitis of pelvic region and thigh (HCC) 10/08/2022   Necrotizing fasciitis of lower leg (HCC) 10/08/2022   Necrotizing fasciitis (HCC) 10/08/2022   Sepsis due to cellulitis (HCC) 10/03/2022   Cellulitis of lower extremity 10/03/2022   Multinodular goiter 06/18/2022   Malnutrition of moderate degree 04/18/2022   Intractable vomiting with nausea 04/17/2022   DKA (diabetic ketoacidosis) (HCC) 03/04/2022   Refractory nausea and vomiting 12/25/2021   Diabetic gastroparesis (HCC) 12/07/2021   Intractable nausea and vomiting 10/09/2021   Drug-seeking behavior 09/21/2021   Essential hypertension 08/25/2021   GERD without esophagitis 08/25/2021   Hematemesis    Prolonged Q-T interval on ECG    Nausea and vomiting 07/20/2021   Hypomagnesemia 04/21/2021   Ulcerated, foot, left, with fat layer exposed (HCC) 04/20/2021   Uncontrolled type 1 diabetes mellitus with hyperglycemia, with long-term current use of insulin (HCC) 12/18/2020   DM type 1 (diabetes mellitus, type 1) (HCC) 12/18/2020   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) 11/09/2020   Diabetic retinopathy of both eyes associated with type 1 diabetes mellitus (HCC) 09/24/2020   Diabetic gastroparesis associated with type 1 diabetes mellitus (HCC) 08/12/2020   AKI (acute kidney injury) (HCC) 07/25/2020   Generalized abdominal pain 07/23/2020   Diabetic nephropathy associated with type 1 diabetes mellitus (HCC) 06/27/2020   Sepsis (HCC) 05/27/2020   HLD (hyperlipidemia) 05/23/2020   Sinus tachycardia 05/09/2020   Anemia 04/20/2020   Depression with anxiety 07/05/2019   Diabetic polyneuropathy associated with type 1 diabetes mellitus (HCC) 09/24/2017   Gastroparesis    UTI (urinary tract infection) 06/14/2011   Goiter 10/05/2009   Hypertension associated with diabetes (HCC) 10/05/2009    Orientation RESPIRATION BLADDER Height & Weight     Self, Time, Situation, Place  Normal Continent Weight: 218 lb 4.1 oz (99 kg) Height:  5\' 7"  (170.2 cm)  BEHAVIORAL SYMPTOMS/MOOD NEUROLOGICAL BOWEL NUTRITION STATUS      Continent Feeding tube  AMBULATORY STATUS COMMUNICATION OF NEEDS Skin   Total Care Verbally Surgical wounds                       Personal Care Assistance Level of Assistance  Bathing, Feeding, Dressing Bathing Assistance: Limited assistance Feeding assistance: Independent Dressing Assistance: Maximum assistance     Functional Limitations Info  Sight, Hearing, Speech Sight Info: Adequate Hearing Info: Adequate Speech Info: Adequate    SPECIAL CARE FACTORS FREQUENCY  PT (By licensed PT), OT (By licensed OT)     PT Frequency: 5x week OT Frequency: 5x week            Contractures Contractures Info: Not  present    Additional Factors Info  Code Status, Allergies, Insulin Sliding Scale Code Status Info: full Allergies Info: Ibuprofen, Lisinopril   Insulin Sliding Scale Info: novolog: see discharge summary       Current Medications (10/27/2022):  This is the current hospital active medication list Current  Facility-Administered Medications  Medication Dose Route Frequency Provider Last Rate Last Admin   0.9 %  sodium chloride infusion   Intravenous PRN Nadara Mustard, MD 10 mL/hr at 10/25/22 0517 Infusion Verify at 10/25/22 0517   acetaminophen (TYLENOL) tablet 1,000 mg  1,000 mg Oral Q6H Pahwani, Daleen Bo, MD   1,000 mg at 10/27/22 0435   acetaminophen (TYLENOL) tablet 650 mg  650 mg Oral Q6H PRN Amin, Ankit C, MD       alum & mag hydroxide-simeth (MAALOX/MYLANTA) 200-200-20 MG/5ML suspension 15-30 mL  15-30 mL Oral Q2H PRN Pahwani, Daleen Bo, MD       ascorbic acid (VITAMIN C) tablet 1,000 mg  1,000 mg Oral Daily Pahwani, Daleen Bo, MD   1,000 mg at 10/27/22 0912   bisacodyl (DULCOLAX) EC tablet 5 mg  5 mg Oral Daily PRN Nadara Mustard, MD       Chlorhexidine Gluconate Cloth 2 % PADS 6 each  6 each Topical Daily Nadara Mustard, MD   6 each at 10/27/22 0914   docusate (COLACE) 50 MG/5ML liquid 100 mg  100 mg Oral Daily Hughie Closs, MD   100 mg at 10/27/22 0912   DULoxetine (CYMBALTA) DR capsule 30 mg  30 mg Oral Daily Hughie Closs, MD   30 mg at 10/27/22 0912   folic acid (FOLVITE) tablet 1 mg  1 mg Oral Daily Hughie Closs, MD   1 mg at 10/27/22 0912   furosemide (LASIX) tablet 40 mg  40 mg Oral Daily Hughie Closs, MD   40 mg at 10/27/22 0911   gabapentin (NEURONTIN) capsule 300 mg  300 mg Oral Q8H Pahwani, Ravi, MD   300 mg at 10/27/22 1335   guaiFENesin-dextromethorphan (ROBITUSSIN DM) 100-10 MG/5ML syrup 15 mL  15 mL Oral Q4H PRN Nadara Mustard, MD       heparin injection 5,000 Units  5,000 Units Subcutaneous Q8H Amin, Ankit C, MD   5,000 Units at 10/27/22 1335   hydrALAZINE (APRESOLINE) injection 5 mg  5 mg Intravenous Q20 Min PRN Nadara Mustard, MD       HYDROmorphone (DILAUDID) injection 0.5 mg  0.5 mg Intravenous Q3H PRN Nadara Mustard, MD   0.5 mg at 10/27/22 0926   insulin aspart (novoLOG) injection 0-15 Units  0-15 Units Subcutaneous Q4H Nadara Mustard, MD   2 Units at 10/27/22 1228   magnesium  citrate solution 1 Bottle  1 Bottle Per Tube Once PRN Hughie Closs, MD       magnesium sulfate IVPB 2 g 50 mL  2 g Intravenous Daily PRN Nadara Mustard, MD       methocarbamol (ROBAXIN) tablet 1,000 mg  1,000 mg Oral TID Hughie Closs, MD   1,000 mg at 10/27/22 0911   metoCLOPramide (REGLAN) tablet 10 mg  10 mg Oral Q8H Pahwani, Ravi, MD   10 mg at 10/27/22 1335   metoprolol tartrate (LOPRESSOR) injection 5 mg  5 mg Intravenous Q4H PRN Amin, Ankit C, MD       multivitamin with minerals tablet 1 tablet  1 tablet Oral Daily Hughie Closs, MD   1 tablet at 10/27/22 8756   nutrition supplement (JUVEN) (JUVEN)  powder packet 1 packet  1 packet Oral BID BM Hughie Closs, MD   1 packet at 10/27/22 1335   omeprazole (KONVOMEP) 2 mg/mL oral suspension 40 mg  40 mg Oral Daily Pahwani, Daleen Bo, MD   40 mg at 10/27/22 0911   ondansetron (ZOFRAN) injection 4 mg  4 mg Intravenous Q6H PRN Nadara Mustard, MD       Oral care mouth rinse  15 mL Mouth Rinse 4 times per day Nadara Mustard, MD   15 mL at 10/27/22 1228   Oral care mouth rinse  15 mL Mouth Rinse PRN Nadara Mustard, MD       oxyCODONE (Oxy IR/ROXICODONE) immediate release tablet 5-10 mg  5-10 mg Oral Q4H PRN Hughie Closs, MD   10 mg at 10/27/22 1227   phenol (CHLORASEPTIC) mouth spray 1 spray  1 spray Mouth/Throat PRN Nadara Mustard, MD       polyethylene glycol (MIRALAX / GLYCOLAX) packet 17 g  17 g Oral Daily PRN Hughie Closs, MD       potassium chloride (KLOR-CON) packet 20-40 mEq  20-40 mEq Oral Daily PRN Hughie Closs, MD       silver nitrate applicators applicator 1 Application  1 Application Topical Once Nadara Mustard, MD       sodium chloride flush (NS) 0.9 % injection 10-40 mL  10-40 mL Intracatheter Q12H Nadara Mustard, MD   10 mL at 10/27/22 0914   sodium chloride flush (NS) 0.9 % injection 10-40 mL  10-40 mL Intracatheter PRN Nadara Mustard, MD       traZODone (DESYREL) tablet 50 mg  50 mg Oral QHS PRN Amin, Ankit C, MD       zinc sulfate  capsule 220 mg  220 mg Oral Daily Hughie Closs, MD   220 mg at 10/27/22 9147     Discharge Medications: Please see discharge summary for a list of discharge medications.  Relevant Imaging Results:  Relevant Lab Results:   Additional Information SSN: [id number]  Lorri Frederick, LCSW

## 2022-10-28 DIAGNOSIS — M726 Necrotizing fasciitis: Secondary | ICD-10-CM | POA: Diagnosis not present

## 2022-10-28 LAB — MAGNESIUM: Magnesium: 1.5 mg/dL — ABNORMAL LOW (ref 1.7–2.4)

## 2022-10-28 LAB — GLUCOSE, CAPILLARY
Glucose-Capillary: 142 mg/dL — ABNORMAL HIGH (ref 70–99)
Glucose-Capillary: 162 mg/dL — ABNORMAL HIGH (ref 70–99)
Glucose-Capillary: 163 mg/dL — ABNORMAL HIGH (ref 70–99)
Glucose-Capillary: 41 mg/dL — CL (ref 70–99)
Glucose-Capillary: 44 mg/dL — CL (ref 70–99)
Glucose-Capillary: 81 mg/dL (ref 70–99)
Glucose-Capillary: 94 mg/dL (ref 70–99)
Glucose-Capillary: 99 mg/dL (ref 70–99)

## 2022-10-28 LAB — BASIC METABOLIC PANEL
Anion gap: 4 — ABNORMAL LOW (ref 5–15)
BUN: 35 mg/dL — ABNORMAL HIGH (ref 6–20)
CO2: 23 mmol/L (ref 22–32)
Calcium: 7.4 mg/dL — ABNORMAL LOW (ref 8.9–10.3)
Chloride: 106 mmol/L (ref 98–111)
Creatinine, Ser: 1.16 mg/dL — ABNORMAL HIGH (ref 0.44–1.00)
GFR, Estimated: >60 mL/min (ref >60)
Glucose, Bld: 57 mg/dL — ABNORMAL LOW (ref 70–99)
Potassium: 4.4 mmol/L (ref 3.5–5.1)
Sodium: 133 mmol/L — ABNORMAL LOW (ref 135–145)

## 2022-10-28 LAB — CBC
HCT: 22.2 % — ABNORMAL LOW (ref 36.0–46.0)
Hemoglobin: 7.3 g/dL — ABNORMAL LOW (ref 12.0–15.0)
MCH: 30 pg (ref 26.0–34.0)
MCHC: 32.9 g/dL (ref 30.0–36.0)
MCV: 91.4 fL (ref 80.0–100.0)
Platelets: 428 10*3/uL — ABNORMAL HIGH (ref 150–400)
RBC: 2.43 MIL/uL — ABNORMAL LOW (ref 3.87–5.11)
RDW: 15.3 % (ref 11.5–15.5)
WBC: 38.3 10*3/uL — ABNORMAL HIGH (ref 4.0–10.5)
nRBC: 0 % (ref 0.0–0.2)

## 2022-10-28 LAB — PREPARE RBC (CROSSMATCH)

## 2022-10-28 LAB — SEDIMENTATION RATE: Sed Rate: 93 mm/h — ABNORMAL HIGH (ref 0–22)

## 2022-10-28 LAB — PHOSPHORUS: Phosphorus: 4.2 mg/dL (ref 2.5–4.6)

## 2022-10-28 LAB — C-REACTIVE PROTEIN: CRP: 14 mg/dL — ABNORMAL HIGH (ref <1.0)

## 2022-10-28 LAB — SURGICAL PATHOLOGY

## 2022-10-28 MED ORDER — INSULIN ASPART 100 UNIT/ML IJ SOLN
0.0000 [IU] | Freq: Three times a day (TID) | INTRAMUSCULAR | Status: DC
  Administered 2022-10-29: 2 [IU] via SUBCUTANEOUS
  Administered 2022-10-29: 1 [IU] via SUBCUTANEOUS
  Administered 2022-10-29 – 2022-10-30 (×2): 2 [IU] via SUBCUTANEOUS
  Administered 2022-10-30: 3 [IU] via SUBCUTANEOUS
  Administered 2022-10-31 (×2): 2 [IU] via SUBCUTANEOUS
  Administered 2022-11-01: 1 [IU] via SUBCUTANEOUS
  Administered 2022-11-01 – 2022-11-02 (×4): 2 [IU] via SUBCUTANEOUS
  Administered 2022-11-03: 1 [IU] via SUBCUTANEOUS
  Administered 2022-11-03: 2 [IU] via SUBCUTANEOUS
  Administered 2022-11-04: 1 [IU] via SUBCUTANEOUS
  Administered 2022-11-04: 2 [IU] via SUBCUTANEOUS
  Administered 2022-11-05: 1 [IU] via SUBCUTANEOUS
  Administered 2022-11-06: 3 [IU] via SUBCUTANEOUS
  Administered 2022-11-06: 2 [IU] via SUBCUTANEOUS
  Administered 2022-11-06: 1 [IU] via SUBCUTANEOUS
  Administered 2022-11-08: 5 [IU] via SUBCUTANEOUS
  Administered 2022-11-08: 2 [IU] via SUBCUTANEOUS
  Administered 2022-11-08: 5 [IU] via SUBCUTANEOUS
  Administered 2022-11-09: 3 [IU] via SUBCUTANEOUS
  Administered 2022-11-09: 2 [IU] via SUBCUTANEOUS
  Administered 2022-11-09 – 2022-11-10 (×3): 3 [IU] via SUBCUTANEOUS

## 2022-10-28 MED ORDER — INSULIN ASPART 100 UNIT/ML IJ SOLN: 0.0000 [IU] | Freq: Every day | INTRAMUSCULAR | Status: DC

## 2022-10-28 MED ORDER — MAGNESIUM SULFATE 2 GM/50ML IV SOLN
2.0000 g | Freq: Once | INTRAVENOUS | Status: AC
  Administered 2022-10-28: 2 g via INTRAVENOUS
  Filled 2022-10-28: qty 50

## 2022-10-28 MED ORDER — DEXTROSE 50 % IV SOLN
25.0000 g | INTRAVENOUS | Status: AC
  Administered 2022-10-28: 25 g via INTRAVENOUS
  Filled 2022-10-28: qty 50

## 2022-10-28 MED ORDER — SODIUM CHLORIDE 0.9% IV SOLUTION: Freq: Once | INTRAVENOUS | Status: DC

## 2022-10-28 NOTE — Progress Notes (Signed)
[facility]  Date of Admission:  10/03/2022      Total days of antibiotics: 25             ASSESSMENT: Kelli Hudson is a 32 y.o. female admitted with   Necrotizing Fasciitis B/L LEs - (PsA, MSSA/MRSE, diptheroids) - Chronic B/L foot wounds -  B/L Fasciotomies, thighs -  -has been on treatment with broad coverage linezolid + meropenem - Now s/p bilateral BKAs. Tissue margins described to be clear with healthy viable muscle. No further debridement was done to thigh wounds since 8/30 (no growth) - abx ended 10/27/22   Leukocytosis -  -marked leukemoid reaction throughout her stay in the setting of severe necrotizing SSTI / foot wounds now s/p B/L BKAs with broad empiric abx and surgery  -repeat CRP 17.2 > 14  -repeat ESR 128 > 93 -continue to follow for new signs of infection    AKI -  Lab Results  Component Value Date   CREATININE 1.16 (H) 10/28/2022   CREATININE 1.48 (H) 10/26/2022   CREATININE 1.63 (H) 10/25/2022  - improving  L Knee Effusion -  -she reports no stiffness or pani here - aspirated in OR on 9/6, sent for cultures only and no growth to date. No cell count but also based on fluid appearance no clinical concern for infection and seemed more reactive.  -will need to keep an eye on this after stopping abx.    Medication Monitoring -  -Hgb 8.0, Thrombocytosis improved (only just above upper margin)      PLAN: Will continue to follow off abx  Repeat CRP/ESR friday    Principal Problem:   Necrotizing fasciitis (HCC) Active Problems:   Anemia   Sepsis (HCC)   AKI (acute kidney injury) (HCC)   Diabetic gastroparesis associated with type 1 diabetes mellitus (HCC)   DM type 1 (diabetes mellitus, type 1) (HCC)   Drug-seeking behavior   Sepsis due to cellulitis (HCC)   Cellulitis of lower extremity   Necrotizing fasciitis of pelvic region and thigh (HCC)   Necrotizing fasciitis of lower leg (HCC)   MDD  (major depressive disorder), recurrent episode, moderate (HCC)    sodium chloride   Intravenous Once   acetaminophen  1,000 mg Oral Q6H   vitamin C  1,000 mg Oral Daily   Chlorhexidine Gluconate Cloth  6 each Topical Daily   docusate sodium  100 mg Oral Daily   DULoxetine  30 mg Oral Daily   folic acid  1 mg Oral Daily   furosemide  40 mg Oral Daily   gabapentin  300 mg Oral Q8H   heparin injection (subcutaneous)  5,000 Units Subcutaneous Q8H   insulin aspart  0-15 Units Subcutaneous Q4H   methocarbamol  1,000 mg Oral TID   metoCLOPramide  10 mg Oral Q8H   multivitamin with minerals  1 tablet Oral Daily   nutrition supplement (JUVEN)  1 packet Oral BID BM   omeprazole  40 mg Oral Daily   mouth rinse  15 mL Mouth Rinse 4 times per day   silver nitrate applicators  1 Application Topical Once   sodium chloride flush  10-40 mL Intracatheter Q12H   zinc sulfate  220 mg Oral Daily    SUBJECTIVE: She is quiet today - no complaints / concerns. No o/n events aside from some hypoglycemic events earlier this AM (41)  Enteral feeding tube is out     Review  of Systems: Review of Systems  Constitutional:  Negative for chills and fever.  Gastrointestinal:  Negative for abdominal pain, nausea and vomiting.     Allergies  Allergen Reactions   Ibuprofen Other (See Comments)    07/23/22 - Worsening kidney function after 3 doses of ibuprofen 400mg .  Creatinine increased from 1.3 to 2.  Creatinine returned to normal after stopping.   Lisinopril Cough, Other (See Comments), Itching and Rash    Breakout in rash    OBJECTIVE: Vitals:   10/27/22 2008 10/27/22 2231 10/28/22 0438 10/28/22 0729  BP: 120/61 115/66 118/70 (!) 149/106  Pulse: (!) 111 (!) 102 88 (!) 115  Resp: 17  17 20   Temp: 98.7 F (37.1 C)  98.3 F (36.8 C) 98.8 F (37.1 C)  TempSrc: Oral   Oral  SpO2: 99% 100% 100% 100%  Weight:      Height:       Body mass index is 34.18 kg/m.  Physical Exam Constitutional:       Appearance: She is not ill-appearing.     Comments: Appears much more bright today.   HENT:     Nose: Nose normal.     Comments: Enteric feeding tube in place Eyes:     Pupils: Pupils are equal, round, and reactive to light.  Cardiovascular:     Rate and Rhythm: Normal rate.  Pulmonary:     Effort: Pulmonary effort is normal.     Breath sounds: Normal breath sounds.  Musculoskeletal:     Comments: Bilateral wound vacs with good seal. No induration noted to periwound from under opsite. Serosanguinous drainage.  She is edematous but all is cool to the touch and more c/w third spacing  Skin:    General: Skin is warm and dry.     Findings: No erythema or rash.  Neurological:     Mental Status: She is oriented to person, place, and time.     Lab Results Lab Results  Component Value Date   WBC 38.3 (H) 10/28/2022   HGB 7.3 (L) 10/28/2022   HCT 22.2 (L) 10/28/2022   MCV 91.4 10/28/2022   PLT 428 (H) 10/28/2022    Lab Results  Component Value Date   CREATININE 1.16 (H) 10/28/2022   BUN 35 (H) 10/28/2022   NA 133 (L) 10/28/2022   K 4.4 10/28/2022   CL 106 10/28/2022   CO2 23 10/28/2022    Lab Results  Component Value Date   ALT 18 10/14/2022   AST 23 10/14/2022   ALKPHOS 193 (H) 10/14/2022   BILITOT 0.4 10/14/2022     Microbiology: Recent Results (from the past 240 hour(s))  Surgical pcr screen     Status: None   Collection Time: 10/23/22  9:05 PM   Specimen: Nasal Mucosa; Nasal Swab  Result Value Ref Range Status   MRSA, PCR NEGATIVE NEGATIVE Final   Staphylococcus aureus NEGATIVE NEGATIVE Final    Comment: (NOTE) The Xpert SA Assay (FDA approved for NASAL specimens in patients 104 years of age and older), is one component of a comprehensive surveillance program. It is not intended to diagnose infection nor to guide or monitor treatment. Performed at [facility], [address] [place], [place] [address]   Aerobic/Anaerobic Culture w Gram Stain  (surgical/deep wound)     Status: None (Preliminary result)   Collection Time: 10/24/22  2:43 PM   Specimen: Path fluid; Body Fluid  Result Value Ref Range Status   Specimen Description FLUID  Final   Special Requests right knee joint fluid  Final   Gram Stain   Final    RARE WBC PRESENT, PREDOMINANTLY PMN NO ORGANISMS SEEN    Culture   Final    NO GROWTH 4 DAYS NO ANAEROBES ISOLATED; CULTURE IN PROGRESS FOR 5 DAYS Performed at [facility], [address], [place], [place] [address]    Report Status PENDING  Incomplete    Rexene Alberts, MSN, NP-C [facility]  [email or web address] Pager: [phone] Office: [phone] [facility] Main Line: [phone] *Secure Chat Communication Welcome

## 2022-10-28 NOTE — Progress Notes (Signed)
Patient ID: Kelli Hudson, female   DOB: 1990/05/31, 31 y.o.   MRN: [id number] Patient is sitting up in bed comfortably this morning.  The knee aspirate is negative for 3 days.  Hemoglobin has dropped from 8-7.3.  White cell count has increased from 33-38.  Patient states that her legs feel sore but denies any acute symptoms.  Continue mobilization with therapy.

## 2022-10-28 NOTE — Progress Notes (Signed)
PROGRESS NOTE    Kelli Hudson  QMV:[id number] DOB: 10/25/1990 DOA: 10/03/2022 PCP: Norm Salt, PA    Brief Narrative:  32 y.o. female with medical history significant of DM1, diabetic gastroparesis with very frequent admission in the past, HTN presented w/ c/o B foot wounds, fever, and pain and informed that she has been dealing with lower extremity wound for past 2 weeks ,followed with her podiatrist in the outpatient setting at [facility] there which she recently finished a course of doxycycline which did not help her symptoms, initially had a blister that ruptured subsequently with redness streaking up her legs and noticing fever chills worsening pain in last few days.    Patient admitted for sepsis due to cellulitis/lower extremity wound, severe anemia, AKI. Podiatry consulted- Unable to complete MRI as gastric pacemaker is not MRI safe.  ID and Ortho were consulted.  Patient went to the OR for bilateral lower extremity necrotizing fasciitis feet to his thigh, remained intubated and required PRBC and FFP transfusion during the case on 8/21.  Eventually extubated on 8/23 and was taken back to the OR on 8/26 for wound VAC change.  Patient has remained on IV antibiotics.  Also underwent bilateral lower extremity BKA by Dr. Lajoyce Corners on 9/6.    Assessment & Plan:   Principal Problem:   Necrotizing fasciitis (HCC) Active Problems:   AKI (acute kidney injury) (HCC)   Sepsis due to cellulitis (HCC)   Cellulitis of lower extremity   Anemia   Diabetic gastroparesis associated with type 1 diabetes mellitus (HCC)   DM type 1 (diabetes mellitus, type 1) (HCC)   Drug-seeking behavior   Sepsis (HCC)   Necrotizing fasciitis of pelvic region and thigh (HCC)   Necrotizing fasciitis of lower leg (HCC)   MDD (major depressive disorder), recurrent episode, moderate (HCC)   Severe sepsis due to bilateral feet abscess, necrotizing fasciitis, POA.  MRSE, sensitive Pseudomonas S/p  bilateral excisional leg and thigh debridement Initially underwent I&D on 8/21 with wound VAC change on 8/26 but ultimately required bilateral BKA by orthopedic on 9/6. Seen by infectious disease, no signs of infection therefore antibiotics stopped.   Acute blood loss anemia due to operative blood loss Secondary to blood loss.  Required multiple units of PRBC and FFP.  Hemoglobin drifted down to 7.3.  Will order 1 unit PRBC   Type 1 diabetes, poorly controlled with hyperglycemia and complicated with gastropathy Continue current sliding scale and Accu-Chek.  Overall appears to have better control for now   Acute kidney injury on CKD stage IIIa due to septic ATN, improving Non-anion gap metabolic acidosis Hyponatremia, euvolemic Resolved now back to baseline creatinine 1.16.  Peaked at 2.54   Severe protein calorie malnutrition Cortrak removed 9/9.  Encouraging oral diet, dietitian following.  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  On Lasix  Hypomagnesemia - PRN repletion     DVT prophylaxis: SCD   Code Status: Full Code  Family Communication: None   Status is: Inpatient Remains inpatient appropriate because: Recovering from surgery      Subjective: No complaints, still poor appetite.   Examination:  General exam: Appears calm and comfortable  Respiratory system: Clear to auscultation. Respiratory effort normal. Cardiovascular system: S1 & S2 heard, RRR. No JVD, murmurs, rubs, gallops or clicks. No pedal  edema. Gastrointestinal system: Abdomen is nondistended, soft and nontender. No organomegaly or masses felt. Normal bowel sounds heard. Central nervous system: Alert and oriented. No focal neurological deficits. Extremities: Bilateral BKA noted Skin: No rashes, lesions or  ulcers Psychiatry: Judgement and insight appear normal. Mood & affect appropriate.   Pressure Injury 10/07/22 Elbow Posterior;Right Stage 2 -  Partial thickness loss of dermis presenting as a shallow open injury with a red, pink wound bed without slough. (Active)  10/07/22 1226  Location: Elbow  Location Orientation: Posterior;Right  Staging: Stage 2 -  Partial thickness loss of dermis presenting as a shallow open injury with a red, pink wound bed without slough.  Wound Description (Comments):   Present on Admission:      Diet Orders (From admission, onward)     Start     Ordered   10/26/22 1234  DIET SOFT Room service appropriate? Yes; Fluid consistency: Thin  Diet effective now       Question Answer Comment  Room service appropriate? Yes   Fluid consistency: Thin      10/26/22 1233            Objective: Vitals:   10/28/22 0438 10/28/22 0729 10/28/22 1240 10/28/22 1308  BP: 118/70 (!) 149/106 123/69 122/75  Pulse: 88 (!) 115 98 87  Resp: 17 20 18 18   Temp: 98.3 F (36.8 C) 98.8 F (37.1 C) 98.6 F (37 C) 98.5 F (36.9 C)  TempSrc:  Oral Oral Oral  SpO2: 100% 100% 100% 100%  Weight:      Height:        Intake/Output Summary (Last 24 hours) at 10/28/2022 1314 Last data filed at 10/27/2022 2023 Gross per 24 hour  Intake --  Output 50 ml  Net -50 ml   Filed Weights   10/21/22 0500 10/22/22 0500 10/24/22 0500  Weight: 99.1 kg 99 kg 99 kg    Scheduled Meds:  sodium chloride   Intravenous Once   acetaminophen  1,000 mg Oral Q6H   vitamin C  1,000 mg Oral Daily   Chlorhexidine Gluconate Cloth  6 each Topical Daily   docusate sodium  100 mg Oral Daily   DULoxetine  30 mg Oral Daily   folic acid  1 mg Oral Daily   furosemide  40 mg Oral Daily   gabapentin  300 mg Oral Q8H   heparin injection (subcutaneous)  5,000 Units Subcutaneous Q8H   insulin aspart  0-5 Units Subcutaneous QHS   insulin aspart  0-9 Units Subcutaneous TID WC   methocarbamol  1,000 mg Oral  TID   metoCLOPramide  10 mg Oral Q8H   multivitamin with minerals  1 tablet Oral Daily   nutrition supplement (JUVEN)  1 packet Oral BID BM   omeprazole  40 mg Oral Daily   mouth rinse  15 mL Mouth Rinse 4 times per day   silver nitrate applicators  1 Application Topical Once   sodium chloride flush  10-40 mL Intracatheter Q12H   zinc sulfate  220 mg Oral Daily   Continuous Infusions:  sodium chloride 10 mL/hr at 10/25/22 0517   magnesium sulfate bolus IVPB      Nutritional status Signs/Symptoms: estimated needs Interventions: Juven, Glucerna shake Body mass index is 34.18 kg/m.  Data Reviewed:   CBC: Recent Labs  Lab 10/24/22 0327 10/25/22 0344 10/26/22 0310 10/27/22 0415 10/28/22 0326  WBC 33.8* 27.6* 32.3* 33.8* 38.3*  HGB 8.7* 7.9* 7.2* 8.0* 7.3*  HCT 26.9* 24.3* 22.2*  24.6* 22.2*  MCV 93.1 92.7 90.2 92.1 91.4  PLT 546* 394 361 410* 428*   Basic Metabolic Panel: Recent Labs  Lab 10/23/22 0415 10/24/22 0327 10/25/22 0344 10/26/22 0310 10/28/22 0326  NA 139 132* 130* 132* 133*  K 4.2 4.6 4.4 4.7 4.4  CL 113* 108 106 107 106  CO2 20* 19* 19* 19* 23  GLUCOSE 123* 152* 92 120* 57*  BUN 84* 80* 68* 50* 35*  CREATININE 2.04* 1.82* 1.63* 1.48* 1.16*  CALCIUM 8.7* 7.9* 7.3* 7.3* 7.4*  MG 2.1 1.9  --   --  1.5*  PHOS  --  3.5  --   --  4.2   GFR: Estimated Creatinine Clearance: 85 mL/min (A) (by C-G formula based on SCr of 1.16 mg/dL (H)). Liver Function Tests: No results for input(s): "AST", "ALT", "ALKPHOS", "BILITOT", "PROT", "ALBUMIN" in the last 168 hours. No results for input(s): "LIPASE", "AMYLASE" in the last 168 hours. No results for input(s): "AMMONIA" in the last 168 hours. Coagulation Profile: Recent Labs  Lab 10/22/22 0511  INR 1.6*   Cardiac Enzymes: No results for input(s): "CKTOTAL", "CKMB", "CKMBINDEX", "TROPONINI" in the last 168 hours. BNP (last 3 results) No results for input(s): "PROBNP" in the last 8760 hours. HbA1C: No results  for input(s): "HGBA1C" in the last 72 hours. CBG: Recent Labs  Lab 10/28/22 0437 10/28/22 0449 10/28/22 0550 10/28/22 0726 10/28/22 1106  GLUCAP 41* 44* 99 81 162*   Lipid Profile: No results for input(s): "CHOL", "HDL", "LDLCALC", "TRIG", "CHOLHDL", "LDLDIRECT" in the last 72 hours. Thyroid Function Tests: No results for input(s): "TSH", "T4TOTAL", "FREET4", "T3FREE", "THYROIDAB" in the last 72 hours. Anemia Panel: No results for input(s): "VITAMINB12", "FOLATE", "FERRITIN", "TIBC", "IRON", "RETICCTPCT" in the last 72 hours. Sepsis Labs: No results for input(s): "PROCALCITON", "LATICACIDVEN" in the last 168 hours.  Recent Results (from the past 240 hour(s))  Surgical pcr screen     Status: None   Collection Time: 10/23/22  9:05 PM   Specimen: Nasal Mucosa; Nasal Swab  Result Value Ref Range Status   MRSA, PCR NEGATIVE NEGATIVE Final   Staphylococcus aureus NEGATIVE NEGATIVE Final    Comment: (NOTE) The Xpert SA Assay (FDA approved for NASAL specimens in patients 23 years of age and older), is one component of a comprehensive surveillance program. It is not intended to diagnose infection nor to guide or monitor treatment. Performed at [facility], [address], [place], [place] [address]   Aerobic/Anaerobic Culture w Gram Stain (surgical/deep wound)     Status: None (Preliminary result)   Collection Time: 10/24/22  2:43 PM   Specimen: Path fluid; Body Fluid  Result Value Ref Range Status   Specimen Description FLUID  Final   Special Requests right knee joint fluid  Final   Gram Stain   Final    RARE WBC PRESENT, PREDOMINANTLY PMN NO ORGANISMS SEEN    Culture   Final    NO GROWTH 4 DAYS NO ANAEROBES ISOLATED; CULTURE IN PROGRESS FOR 5 DAYS Performed at [facility], [address] [place], [place] [address]    Report Status PENDING  Incomplete         Radiology Studies: No results found.         LOS: 25 days   Time spent= 35  mins    Miguel Rota, MD [facility]  If 7PM-7AM, please contact night-coverage  10/28/2022, 1:14 PM

## 2022-10-28 NOTE — Progress Notes (Signed)
Received iv consult for blood draw from picc. Found dressing to be soiled and peeling off on both sides. Changed dressing & caps without incident. Notified primary rn to hang new tubing.

## 2022-10-28 NOTE — Inpatient Diabetes Management (Signed)
Inpatient Diabetes Program Recommendations  AACE/ADA: New Consensus Statement on Inpatient Glycemic Control (2015)  Target Ranges:  Prepandial:   less than 140 mg/dL      Peak postprandial:   less than 180 mg/dL (1-2 hours)      Critically ill patients:  140 - 180 mg/dL   Lab Results  Component Value Date   GLUCAP 81 10/28/2022   HGBA1C 8.8 (A) 06/18/2022    Review of Glycemic Control  Latest Reference Range & Units 10/27/22 07:29 10/27/22 11:12 10/27/22 15:53 10/27/22 19:44 10/28/22 00:07 10/28/22 04:37 10/28/22 04:49 10/28/22 05:50 10/28/22 07:26  Glucose-Capillary 70 - 99 mg/dL 132 (H) 440 (H) 102 (H) 176 (H) 142 (H) 41 (LL) 44 (LL) 99 81    Diabetes history: DM type 1 Outpatient Diabetes medications: Levemir 8 units bid, Novolin R 1-10 units 4 times daily  Current orders for Inpatient glycemic control:  Novolog 0-15 units q4 hours  A1c 8.8% on 5/1  Note: hypoglycemia this am. Renal function elevated, however, is lower than in previous days. Pt now with diet orders, does not need Q4 hour coverage anymore with diet.  -   Reduce Novolog Correction scale to 0-9 units and change frequency to tid + hs scale.  Thanks,  Christena Deem RN, MSN, BC-ADM Inpatient Diabetes Coordinator Team Pager [phone] (8a-5p)

## 2022-10-28 NOTE — Progress Notes (Signed)
Physical Therapy Treatment Patient Details Name: Kelli Hudson MRN: [id number] DOB: [birth year] Today's Date: 10/28/2022   History of Present Illness Patient is 32 y.o. female presented to hospital 10/03/22 for bil foot wounds, fever, and pain for ~2 weeks with no improvement with antibiotics. Pt admitted for sepsis with necrotizing fasciitis/gangrene bilateral feet elected for surgical management. Pt underwent excision and debridement of BLE extending feet to thighs on 10/08/22 and remained intubated in ICU. 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 PMH significant for DM, HTN, gastric pacemaker.    PT Comments  Pt requesting to get on bed pan on entry, requiring modA for turning and placing. Unfortunately, bedpan overfilled and pt required multiple bouts of rolling to clean her and the bed. With cuing for positioning and sequencing pt able to roll R and L with min A. After cleaned up pt agreeable to bed level exercise only due to increased pain. Pt only able to receive muscle relaxant and nerve pain medication prior to continued PT. Pt does find some relief with sidelying and is able to work on hip and knee ROM on both R and L. Pt able to receive IV pain medication at end of session and PT notified RN. D/c plans remain appropriate. PT will continue to progress mobility.     If plan is discharge home, recommend the following: Two people to help with walking and/or transfers;Two people to help with bathing/dressing/bathroom;Assistance with cooking/housework;Assist for transportation;Help with stairs or ramp for entrance;Supervision due to cognitive status   Can travel by private vehicle      No   Equipment Recommendations  Wheelchair (measurements PT);Wheelchair cushion (measurements PT);Other (comment) (TBD at next venue)    Recommendations for Other Services Rehab consult     Precautions / Restrictions Precautions Precautions: Fall Precaution Comments: bil wound vac,  cortrak Restrictions Weight Bearing Restrictions: Yes RLE Weight Bearing: Non weight bearing LLE Weight Bearing: Non weight bearing Other Position/Activity Restrictions: bil BKA     Mobility  Bed Mobility Overal bed mobility: Needs Assistance Bed Mobility: Supine to Sit, Rolling     Supine to sit: Contact guard     General bed mobility comments: pt able to come to long sitting with use of bedrails, requires modA for rolling to R and L for bedpan placement and pericare, afterward provided education on sequencing and pt able to perform with min A    Transfers                   General transfer comment: deferred due to pt pain          Balance Overall balance assessment: Needs assistance Sitting-balance support: Feet unsupported, Bilateral upper extremity supported Sitting balance-Leahy Scale: Fair Sitting balance - Comments: reliant on support at start for static balance. fading to CGA.                                    Cognition Arousal: Alert Behavior During Therapy: Flat affect Overall Cognitive Status: Within Functional Limits for tasks assessed                                          Exercises Amputee Exercises Hip Extension: AROM, Both, 10 reps, Sidelying Hip ABduction/ADduction: AROM, Both, 5 reps, Sidelying Hip Flexion/Marching: AROM, Both, 10 reps,  Supine Knee Flexion: AROM, Both, 5 reps, Supine Knee Extension: AROM, Both, 5 reps, Supine Straight Leg Raises: AROM, Both, 5 reps, Supine    General Comments General comments (skin integrity, edema, etc.): pt with increased tightness in thighs, educated on increased movement to decrease swelling      Pertinent Vitals/Pain Pain Assessment Pain Assessment: Faces Faces Pain Scale: Hurts whole lot Pain Location: bil LE with movement Pain Descriptors / Indicators: Grimacing Pain Intervention(s): Limited activity within patient's tolerance, Monitored during session,  Repositioned, Patient requesting pain meds-RN notified     PT Goals (current goals can now be found in the care plan section) Acute Rehab PT Goals Patient Stated Goal: regain independence and ability to walk with prosthetics PT Goal Formulation: With patient Time For Goal Achievement: 11/08/22 Potential to Achieve Goals: Good Progress towards PT goals: Not progressing toward goals - comment    Frequency    Min 1X/week       AM-PAC PT "6 Clicks" Mobility   Outcome Measure  Help needed turning from your back to your side while in a flat bed without using bedrails?: A Lot Help needed moving from lying on your back to sitting on the side of a flat bed without using bedrails?: Total Help needed moving to and from a bed to a chair (including a wheelchair)?: Total Help needed standing up from a chair using your arms (e.g., wheelchair or bedside chair)?: Total Help needed to walk in hospital room?: Total Help needed climbing 3-5 steps with a railing? : Total 6 Click Score: 7    End of Session   Activity Tolerance: Patient limited by pain Patient left: in bed;with bed alarm set;with call bell/phone within reach Nurse Communication: Mobility status;Need for lift equipment (2+) PT Visit Diagnosis: Muscle weakness (generalized) (M62.81);Difficulty in walking, not elsewhere classified (R26.2);Other symptoms and signs involving the nervous system (R29.898);Other abnormalities of gait and mobility (R26.89);Unsteadiness on feet (R26.81);Pain Pain - Right/Left:  (bil) Pain - part of body: Leg     Time: 1425-1506 PT Time Calculation (min) (ACUTE ONLY): 41 min  Charges:    $Therapeutic Exercise: 8-22 mins $Therapeutic Activity: 8-22 mins PT General Charges $$ ACUTE PT VISIT: 1 Visit                     Jakobe Blau B. Beverely Risen PT, DPT Acute Rehabilitation Services Please use secure chat or  Call Office [phone]    Elon Alas [facility] 10/28/2022, 3:58 PM

## 2022-10-28 NOTE — Plan of Care (Signed)
  Problem: Education: Goal: Knowledge of General Education information will improve Description: Including pain rating scale, medication(s)/side effects and non-pharmacologic comfort measures Outcome: Progressing   Problem: Health Behavior/Discharge Planning: Goal: Ability to manage health-related needs will improve Outcome: Progressing   Problem: Clinical Measurements: Goal: Ability to maintain clinical measurements within normal limits will improve Outcome: Progressing   Problem: Coping: Goal: Level of anxiety will decrease Outcome: Progressing   Problem: Elimination: Goal: Will not experience complications related to bowel motility Outcome: Progressing Goal: Will not experience complications related to urinary retention Outcome: Progressing   Problem: Skin Integrity: Goal: Risk for impaired skin integrity will decrease Outcome: Progressing   Problem: Metabolic: Goal: Ability to maintain appropriate glucose levels will improve Outcome: Progressing   Problem: Nutritional: Goal: Maintenance of adequate nutrition will improve Outcome: Progressing Goal: Progress toward achieving an optimal weight will improve Outcome: Progressing   Problem: Role Relationship: Goal: Method of communication will improve Outcome: Progressing

## 2022-10-29 DIAGNOSIS — M726 Necrotizing fasciitis: Secondary | ICD-10-CM | POA: Diagnosis not present

## 2022-10-29 LAB — TYPE AND SCREEN
ABO/RH(D): O POS
Antibody Screen: NEGATIVE
Unit division: 0

## 2022-10-29 LAB — AEROBIC/ANAEROBIC CULTURE W GRAM STAIN (SURGICAL/DEEP WOUND): Culture: NO GROWTH

## 2022-10-29 LAB — BASIC METABOLIC PANEL
Anion gap: 6 (ref 5–15)
BUN: 32 mg/dL — ABNORMAL HIGH (ref 6–20)
CO2: 22 mmol/L (ref 22–32)
Calcium: 7.1 mg/dL — ABNORMAL LOW (ref 8.9–10.3)
Chloride: 102 mmol/L (ref 98–111)
Creatinine, Ser: 1.17 mg/dL — ABNORMAL HIGH (ref 0.44–1.00)
GFR, Estimated: >60 mL/min (ref >60)
Glucose, Bld: 239 mg/dL — ABNORMAL HIGH (ref 70–99)
Potassium: 4.9 mmol/L (ref 3.5–5.1)
Sodium: 130 mmol/L — ABNORMAL LOW (ref 135–145)

## 2022-10-29 LAB — BPAM RBC
Blood Product Expiration Date: 202410072359
ISSUE DATE / TIME: 202409101705
Unit Type and Rh: 5100

## 2022-10-29 LAB — CBC
HCT: 25.7 % — ABNORMAL LOW (ref 36.0–46.0)
Hemoglobin: 8.4 g/dL — ABNORMAL LOW (ref 12.0–15.0)
MCH: 30 pg (ref 26.0–34.0)
MCHC: 32.7 g/dL (ref 30.0–36.0)
MCV: 91.8 fL (ref 80.0–100.0)
Platelets: 481 10*3/uL — ABNORMAL HIGH (ref 150–400)
RBC: 2.8 MIL/uL — ABNORMAL LOW (ref 3.87–5.11)
RDW: 15.3 % (ref 11.5–15.5)
WBC: 32.7 10*3/uL — ABNORMAL HIGH (ref 4.0–10.5)
nRBC: 0 % (ref 0.0–0.2)

## 2022-10-29 LAB — GLUCOSE, CAPILLARY
Glucose-Capillary: 197 mg/dL — ABNORMAL HIGH (ref 70–99)
Glucose-Capillary: 177 mg/dL — ABNORMAL HIGH (ref 70–99)
Glucose-Capillary: 148 mg/dL — ABNORMAL HIGH (ref 70–99)
Glucose-Capillary: 155 mg/dL — ABNORMAL HIGH (ref 70–99)

## 2022-10-29 LAB — MAGNESIUM: Magnesium: 1.6 mg/dL — ABNORMAL LOW (ref 1.7–2.4)

## 2022-10-29 MED ORDER — HYDROMORPHONE HCL 2 MG PO TABS
2.0000 mg | ORAL_TABLET | ORAL | Status: DC | PRN
  Administered 2022-10-29 – 2022-11-13 (×53): 2 mg via ORAL
  Filled 2022-10-29 (×55): qty 1

## 2022-10-29 MED ORDER — POLYETHYLENE GLYCOL 3350 17 G PO PACK: 17.0000 g | PACK | Freq: Two times a day (BID) | ORAL | Status: DC | PRN

## 2022-10-29 MED ORDER — HYDROMORPHONE HCL 1 MG/ML IJ SOLN
0.5000 mg | INTRAMUSCULAR | Status: DC | PRN
  Administered 2022-10-29 – 2022-10-31 (×9): 0.5 mg via INTRAVENOUS
  Filled 2022-10-29 (×10): qty 0.5

## 2022-10-29 MED ORDER — OXYCODONE HCL 5 MG PO TABS: 5.0000 mg | ORAL_TABLET | ORAL | Status: DC | PRN

## 2022-10-29 MED ORDER — INSULIN GLARGINE-YFGN 100 UNIT/ML ~~LOC~~ SOLN
4.0000 [IU] | Freq: Every day | SUBCUTANEOUS | Status: DC
  Administered 2022-10-29 – 2022-11-04 (×7): 4 [IU] via SUBCUTANEOUS
  Filled 2022-10-29 (×7): qty 0.04

## 2022-10-29 MED ORDER — LOPERAMIDE HCL 2 MG PO CAPS
4.0000 mg | ORAL_CAPSULE | Freq: Four times a day (QID) | ORAL | Status: DC | PRN
  Administered 2022-10-29 – 2022-11-18 (×21): 4 mg via ORAL
  Filled 2022-10-29 (×21): qty 2

## 2022-10-29 MED ORDER — MAGNESIUM SULFATE 4 GM/100ML IV SOLN
4.0000 g | Freq: Once | INTRAVENOUS | Status: AC
  Administered 2022-10-29: 4 g via INTRAVENOUS
  Filled 2022-10-29: qty 100

## 2022-10-29 MED ORDER — TRAMADOL HCL 50 MG PO TABS: 50.0000 mg | ORAL_TABLET | Freq: Three times a day (TID) | ORAL | Status: DC | PRN

## 2022-10-29 MED ORDER — TRAMADOL HCL 50 MG PO TABS
50.0000 mg | ORAL_TABLET | Freq: Three times a day (TID) | ORAL | Status: DC | PRN
  Administered 2022-10-29 – 2022-11-04 (×4): 50 mg via ORAL
  Filled 2022-10-29 (×6): qty 1

## 2022-10-29 MED ORDER — DOCUSATE SODIUM 100 MG PO CAPS
100.0000 mg | ORAL_CAPSULE | Freq: Two times a day (BID) | ORAL | Status: DC
  Administered 2022-10-30 – 2022-11-13 (×7): 100 mg via ORAL
  Filled 2022-10-29 (×18): qty 1

## 2022-10-29 NOTE — Progress Notes (Signed)
PROGRESS NOTE    Kelli Hudson  UJW:[id number] DOB: Jan 26, 1991 DOA: 10/03/2022 PCP: Norm Salt, PA    Brief Narrative:  32 y.o. female with medical history significant of DM1, diabetic gastroparesis with very frequent admission in the past, HTN presented w/ c/o B foot wounds, fever, and pain and informed that she has been dealing with lower extremity wound for past 2 weeks ,followed with her podiatrist in the outpatient setting at [facility] there which she recently finished a course of doxycycline which did not help her symptoms, initially had a blister that ruptured subsequently with redness streaking up her legs and noticing fever chills worsening pain in last few days.    Patient admitted for sepsis due to cellulitis/lower extremity wound, severe anemia, AKI. Podiatry consulted- Unable to complete MRI as gastric pacemaker is not MRI safe.  ID and Ortho were consulted.  Patient went to the OR for bilateral lower extremity necrotizing fasciitis feet to his thigh, remained intubated and required PRBC and FFP transfusion during the case on 8/21.  Eventually extubated on 8/23 and was taken back to the OR on 8/26 for wound VAC change.  Patient has remained on IV antibiotics.  Also underwent bilateral lower extremity BKA by Dr. Lajoyce Corners on 9/6.    Assessment & Plan:   Principal Problem:   Necrotizing fasciitis (HCC) Active Problems:   AKI (acute kidney injury) (HCC)   Sepsis due to cellulitis (HCC)   Cellulitis of lower extremity   Anemia   Diabetic gastroparesis associated with type 1 diabetes mellitus (HCC)   DM type 1 (diabetes mellitus, type 1) (HCC)   Drug-seeking behavior   Sepsis (HCC)   Necrotizing fasciitis of pelvic region and thigh (HCC)   Necrotizing fasciitis of lower leg (HCC)   MDD (major depressive disorder), recurrent episode, moderate (HCC)   Severe sepsis due to bilateral feet abscess, necrotizing fasciitis, POA.  MRSE, sensitive Pseudomonas S/p  bilateral excisional leg and thigh debridement Initially underwent I&D on 8/21 with wound VAC change on 8/26 but ultimately required bilateral BKA by orthopedic on 9/6. Seen by infectious disease, no signs of infection therefore antibiotics stopped.   Acute blood loss anemia due to operative blood loss Secondary to blood loss.  Required multiple units of PRBC and FFP.  Hemoglobin now 8.5   Type 1 diabetes, poorly controlled with hyperglycemia and complicated with gastropathy Continue current sliding scale and Accu-Chek.  Overall appears to have better control for now   Acute kidney injury on CKD stage IIIa due to septic ATN, improving Non-anion gap metabolic acidosis Hyponatremia, euvolemic Resolved now back to baseline creatinine 1.16.  Peaked at 2.54   Severe protein calorie malnutrition Cortrak removed 9/9.  Encouraging oral diet, dietitian following.  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  On Lasix  Hypomagnesemia - PRN repletion   PT/OT-CIR   DVT prophylaxis: SCD   Code Status: Full Code  Family Communication: None   Status is: Inpatient Remains inpatient appropriate because: Recovering from surgery           Subjective: Slowly improving her appetite, no other complaints at this time.   Examination:  General exam: Appears calm and comfortable  Respiratory system: Clear to auscultation. Respiratory effort normal. Cardiovascular system: S1 & S2 heard, RRR. No JVD, murmurs, rubs, gallops or  clicks. No pedal edema. Gastrointestinal system: Abdomen is nondistended, soft and nontender. No organomegaly or masses felt. Normal bowel sounds heard. Central nervous system: Alert and oriented. No focal neurological deficits. Extremities: Bilateral BKA Skin: No rashes, lesions or  ulcers Psychiatry: Judgement and insight appear normal. Mood & affect appropriate.   Pressure Injury 10/07/22 Elbow Posterior;Right Stage 2 -  Partial thickness loss of dermis presenting as a shallow open injury with a red, pink wound bed without slough. (Active)  10/07/22 1226  Location: Elbow  Location Orientation: Posterior;Right  Staging: Stage 2 -  Partial thickness loss of dermis presenting as a shallow open injury with a red, pink wound bed without slough.  Wound Description (Comments):   Present on Admission:      Diet Orders (From admission, onward)     Start     Ordered   10/26/22 1234  DIET SOFT Room service appropriate? Yes; Fluid consistency: Thin  Diet effective now       Question Answer Comment  Room service appropriate? Yes   Fluid consistency: Thin      10/26/22 1233            Objective: Vitals:   10/28/22 1730 10/28/22 1951 10/29/22 0408 10/29/22 0746  BP: 137/85 132/70 126/75 (!) 148/89  Pulse: (!) 101 (!) 110 94 94  Resp: 18 17 17 19   Temp: 98.3 F (36.8 C) 98.1 F (36.7 C) 99 F (37.2 C) 97.7 F (36.5 C)  TempSrc: Oral   Oral  SpO2: 100% 99% 99% 100%  Weight:      Height:        Intake/Output Summary (Last 24 hours) at 10/29/2022 1210 Last data filed at 10/29/2022 1037 Gross per 24 hour  Intake 1045 ml  Output --  Net 1045 ml   Filed Weights   10/21/22 0500 10/22/22 0500 10/24/22 0500  Weight: 99.1 kg 99 kg 99 kg    Scheduled Meds:  sodium chloride   Intravenous Once   acetaminophen  1,000 mg Oral Q6H   vitamin C  1,000 mg Oral Daily   Chlorhexidine Gluconate Cloth  6 each Topical Daily   docusate sodium  100 mg Oral Daily   DULoxetine  30 mg Oral Daily   folic acid  1 mg Oral Daily   furosemide  40 mg Oral Daily   gabapentin  300 mg Oral Q8H   heparin injection (subcutaneous)  5,000 Units Subcutaneous Q8H   insulin aspart  0-5 Units Subcutaneous QHS   insulin aspart  0-9 Units Subcutaneous TID WC   methocarbamol  1,000 mg  Oral TID   metoCLOPramide  10 mg Oral Q8H   multivitamin with minerals  1 tablet Oral Daily   nutrition supplement (JUVEN)  1 packet Oral BID BM   omeprazole  40 mg Oral Daily   mouth rinse  15 mL Mouth Rinse 4 times per day   silver nitrate applicators  1 Application Topical Once   sodium chloride flush  10-40 mL Intracatheter Q12H   zinc sulfate  220 mg Oral Daily   Continuous Infusions:  sodium chloride 10 mL/hr at 10/25/22 0517   magnesium sulfate bolus IVPB     magnesium sulfate bolus IVPB 4 g (10/29/22 1050)    Nutritional status Signs/Symptoms: estimated needs Interventions: Juven, Glucerna shake Body mass index is 34.18 kg/m.  Data Reviewed:   CBC: Recent Labs  Lab 10/25/22 0344 10/26/22 0310 10/27/22 0415 10/28/22 0326 10/29/22 0325  WBC 27.6* 32.3* 33.8* 38.3*  32.7*  HGB 7.9* 7.2* 8.0* 7.3* 8.4*  HCT 24.3* 22.2* 24.6* 22.2* 25.7*  MCV 92.7 90.2 92.1 91.4 91.8  PLT 394 361 410* 428* 481*   Basic Metabolic Panel: Recent Labs  Lab 10/23/22 0415 10/24/22 0327 10/25/22 0344 10/26/22 0310 10/28/22 0326 10/29/22 0325  NA 139 132* 130* 132* 133* 130*  K 4.2 4.6 4.4 4.7 4.4 4.9  CL 113* 108 106 107 106 102  CO2 20* 19* 19* 19* 23 22  GLUCOSE 123* 152* 92 120* 57* 239*  BUN 84* 80* 68* 50* 35* 32*  CREATININE 2.04* 1.82* 1.63* 1.48* 1.16* 1.17*  CALCIUM 8.7* 7.9* 7.3* 7.3* 7.4* 7.1*  MG 2.1 1.9  --   --  1.5* 1.6*  PHOS  --  3.5  --   --  4.2  --    GFR: Estimated Creatinine Clearance: 84.2 mL/min (A) (by C-G formula based on SCr of 1.17 mg/dL (H)). Liver Function Tests: No results for input(s): "AST", "ALT", "ALKPHOS", "BILITOT", "PROT", "ALBUMIN" in the last 168 hours. No results for input(s): "LIPASE", "AMYLASE" in the last 168 hours. No results for input(s): "AMMONIA" in the last 168 hours. Coagulation Profile: No results for input(s): "INR", "PROTIME" in the last 168 hours. Cardiac Enzymes: No results for input(s): "CKTOTAL", "CKMB",  "CKMBINDEX", "TROPONINI" in the last 168 hours. BNP (last 3 results) No results for input(s): "PROBNP" in the last 8760 hours. HbA1C: No results for input(s): "HGBA1C" in the last 72 hours. CBG: Recent Labs  Lab 10/28/22 1106 10/28/22 1618 10/28/22 1951 10/29/22 0737 10/29/22 1122  GLUCAP 162* 94 163* 197* 177*   Lipid Profile: No results for input(s): "CHOL", "HDL", "LDLCALC", "TRIG", "CHOLHDL", "LDLDIRECT" in the last 72 hours. Thyroid Function Tests: No results for input(s): "TSH", "T4TOTAL", "FREET4", "T3FREE", "THYROIDAB" in the last 72 hours. Anemia Panel: No results for input(s): "VITAMINB12", "FOLATE", "FERRITIN", "TIBC", "IRON", "RETICCTPCT" in the last 72 hours. Sepsis Labs: No results for input(s): "PROCALCITON", "LATICACIDVEN" in the last 168 hours.  Recent Results (from the past 240 hour(s))  Surgical pcr screen     Status: None   Collection Time: 10/23/22  9:05 PM   Specimen: Nasal Mucosa; Nasal Swab  Result Value Ref Range Status   MRSA, PCR NEGATIVE NEGATIVE Final   Staphylococcus aureus NEGATIVE NEGATIVE Final    Comment: (NOTE) The Xpert SA Assay (FDA approved for NASAL specimens in patients 24 years of age and older), is one component of a comprehensive surveillance program. It is not intended to diagnose infection nor to guide or monitor treatment. Performed at [facility], [address], [place], [place] [address]   Aerobic/Anaerobic Culture w Gram Stain (surgical/deep wound)     Status: None (Preliminary result)   Collection Time: 10/24/22  2:43 PM   Specimen: Path fluid; Body Fluid  Result Value Ref Range Status   Specimen Description FLUID  Final   Special Requests right knee joint fluid  Final   Gram Stain   Final    RARE WBC PRESENT, PREDOMINANTLY PMN NO ORGANISMS SEEN    Culture   Final    NO GROWTH 4 DAYS NO ANAEROBES ISOLATED; CULTURE IN PROGRESS FOR 5 DAYS Performed at [facility], [address] [place], [place]  [address]    Report Status PENDING  Incomplete         Radiology Studies: No results found.         LOS: 26 days   Time spent= 35 mins  Miguel Rota, MD [facility]  If 7PM-7AM, please contact night-coverage  10/29/2022, 12:10 PM

## 2022-10-29 NOTE — Plan of Care (Signed)
Id brief note  Chart reviewed Wbc down Afebrile Hds  No sign of clinical worsening off abx   -continue to defer abx and anticipate several days at least prior to improvement of wbc -will sign off -discuss with primary team

## 2022-10-29 NOTE — TOC Progression Note (Addendum)
Transition of Care [facility]) - Progression Note    Patient Details  Name: Kelli Hudson MRN: [id number] Date of Birth: 1990-06-24  Transition of [facility]) CM/SW Contact  Lorri Frederick, LCSW Phone Number: 10/29/2022, 3:50 PM  Clinical Narrative:   Additional disability information received from pt mother and forwarded to [facility].  She will review with her business office.  1530: Message from [facility].  They cannot offer bed.   1600: disability information forwarded to [facility] and she will review.  Pt does have one bed offer at [facility].  CSW spoke with pt mother [name], updated her on the above.  She will discuss with pt, would like to know if [facility] can make bed offer.   1640: TC [facility].  She has to rescind bed offer, did not see that pt was only 32.    Expected Discharge Plan: Skilled Nursing Facility Barriers to Discharge: Continued Medical Work up, SNF Pending bed offer  Expected Discharge Plan and Services In-house Referral: Clinical Social Work   Post Acute Care Choice: Skilled Nursing Facility Living arrangements for the past 2 months: Apartment                                       Social Determinants of Health (SDOH) Interventions SDOH Screenings   Food Insecurity: No Food Insecurity (10/03/2022)  Housing: Low Risk  (10/03/2022)  Transportation Needs: No Transportation Needs (10/03/2022)  Utilities: Not At Risk (10/03/2022)  Alcohol Screen: Low Risk  (05/29/2021)  Depression (PHQ2-9): Low Risk  (05/29/2021)  Recent Concern: Depression (PHQ2-9) - Medium Risk (04/10/2021)  Financial Resource Strain: Low Risk  (02/13/2022)   Received from [facility], [facility]  Social Connections: Unknown (06/17/2021)   Received from [facility], [facility]  Stress: No Stress Concern Present (09/16/2022)   Received from [facility]  Tobacco Use: Low Risk  (10/24/2022)    Readmission Risk Interventions    05/08/2022    9:45 AM  03/24/2022    3:33 PM 12/12/2021    8:59 AM  Readmission Risk Prevention Plan  Transportation Screening Complete Complete Complete  Medication Review Oceanographer) Complete Complete Complete  PCP or Specialist appointment within 3-5 days of discharge Complete Complete Complete  HRI or Home Care Consult Complete Complete Complete  SW Recovery Care/Counseling Consult Complete Complete Complete  Palliative Care Screening Not Applicable Not Applicable Not Applicable  Skilled Nursing Facility Not Applicable Not Applicable Not Applicable

## 2022-10-29 NOTE — Progress Notes (Signed)
Physical Therapy Treatment Patient Details Name: Kelli Hudson MRN: [id number] DOB: Feb 04, 1991 Today's Date: 10/29/2022   History of Present Illness Patient is 32 y.o. female presented to hospital 10/03/22 for bil foot wounds, fever, and pain for ~2 weeks with no improvement with antibiotics. Pt admitted for sepsis with necrotizing fasciitis/gangrene bilateral feet elected for surgical management. Pt underwent excision and debridement of BLE extending feet to thighs on 10/08/22 and remained intubated in ICU. 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 PMH significant for DM, HTN, gastric pacemaker.    PT Comments  Pt with multiple family members in room on entry. Mother expresses desire for pt to get up to recliner today. Pt in agreement. Pt reports apprehension about mobilization out of bed. Discuss with pt that given recent administration of pain medication optimal time for mobilization. With HoB elevated pt able to come to longsitting in bed and pivot hips to EoB for AP transfer into chair. With increased effort and maximal cuing pt is able to scoot back into chair with min A for management of LE and wound vacs. Patient will benefit from continued inpatient follow up therapy, <3 hours/day. PT will work on wheelchair transfers in next session to increase pt independence.      If plan is discharge home, recommend the following: Two people to help with walking and/or transfers;Two people to help with bathing/dressing/bathroom;Assistance with cooking/housework;Assist for transportation;Help with stairs or ramp for entrance;Supervision due to cognitive status   Can travel by private vehicle     No  Equipment Recommendations  Wheelchair (measurements PT);Wheelchair cushion (measurements PT);Other (comment) (TBD at next venue)    Recommendations for Other Services       Precautions / Restrictions Precautions Precautions: Fall Precaution Comments: bil wound  vac Restrictions Weight Bearing Restrictions: Yes RLE Weight Bearing: Non weight bearing LLE Weight Bearing: Non weight bearing Other Position/Activity Restrictions: bil BKA     Mobility  Bed Mobility Overal bed mobility: Needs Assistance Bed Mobility: Supine to Sit, Rolling     Supine to sit: Contact guard, HOB elevated     General bed mobility comments: with HoB elevated pt able to come to longsitting and use UE for pivoting hips in bed contact guard for safety and managment of wound vacs    Transfers Overall transfer level: Needs assistance   Transfers: Bed to chair/wheelchair/BSC         Anterior-Posterior transfers: Min assist   General transfer comment: minA for pad scoot of hips from bed to lower recliner, vc for hand placement and increased bilateral triceps activation to off weight hips to facility scoot, pt reports apprehension about falling         Balance Overall balance assessment: Needs assistance Sitting-balance support: Feet unsupported, Bilateral upper extremity supported Sitting balance-Leahy Scale: Fair Sitting balance - Comments: reliant on support at start for static balance. fading to CGA.                                    Cognition Arousal: Alert Behavior During Therapy: Flat affect Overall Cognitive Status: Within Functional Limits for tasks assessed                                          Exercises Other Exercises Other Exercises: chair pushups x5  General Comments  Family present during session       Pertinent Vitals/Pain Pain Assessment Pain Assessment: Faces Faces Pain Scale: Hurts whole lot Pain Location: bil LE with movement Pain Descriptors / Indicators: Grimacing Pain Intervention(s): Limited activity within patient's tolerance, Monitored during session, Premedicated before session, Repositioned     PT Goals (current goals can now be found in the care plan section) Acute Rehab PT  Goals Patient Stated Goal: regain independence and ability to walk with prosthetics PT Goal Formulation: With patient Time For Goal Achievement: 11/08/22 Potential to Achieve Goals: Good Progress towards PT goals: Progressing toward goals    Frequency    Min 1X/week       AM-PAC PT "6 Clicks" Mobility   Outcome Measure  Help needed turning from your back to your side while in a flat bed without using bedrails?: A Lot Help needed moving from lying on your back to sitting on the side of a flat bed without using bedrails?: Total Help needed moving to and from a bed to a chair (including a wheelchair)?: Total Help needed standing up from a chair using your arms (e.g., wheelchair or bedside chair)?: Total Help needed to walk in hospital room?: Total Help needed climbing 3-5 steps with a railing? : Total 6 Click Score: 7    End of Session   Activity Tolerance: Patient limited by pain Patient left: in bed;with bed alarm set;with call bell/phone within reach Nurse Communication: Mobility status;Need for lift equipment (2+) PT Visit Diagnosis: Muscle weakness (generalized) (M62.81);Difficulty in walking, not elsewhere classified (R26.2);Other symptoms and signs involving the nervous system (R29.898);Other abnormalities of gait and mobility (R26.89);Unsteadiness on feet (R26.81);Pain Pain - Right/Left:  (bil) Pain - part of body: Leg     Time: 1610-9604 PT Time Calculation (min) (ACUTE ONLY): 23 min  Charges:    $Therapeutic Exercise: 8-22 mins $Therapeutic Activity: 8-22 mins PT General Charges $$ ACUTE PT VISIT: 1 Visit                     Jozelyn Kuwahara B. Beverely Risen PT, DPT Acute Rehabilitation Services Please use secure chat or  Call Office [phone]    [facility] 10/29/2022, 4:05 PM

## 2022-10-29 NOTE — Inpatient Diabetes Management (Signed)
Inpatient Diabetes Program Recommendations  AACE/ADA: New Consensus Statement on Inpatient Glycemic Control (2015)  Target Ranges:  Prepandial:   less than 140 mg/dL      Peak postprandial:   less than 180 mg/dL (1-2 hours)      Critically ill patients:  140 - 180 mg/dL   Lab Results  Component Value Date   GLUCAP 177 (H) 10/29/2022   HGBA1C 8.8 (A) 06/18/2022    Review of Glycemic Control  Latest Reference Range & Units 10/28/22 11:06 10/28/22 16:18 10/28/22 19:51 10/29/22 07:37 10/29/22 11:22  Glucose-Capillary 70 - 99 mg/dL 086 (H) 94 578 (H) 469 (H) 177 (H)  Diabetes history: DM type 1 Outpatient Diabetes medications: Levemir 8 units bid, Novolin R 1-10 units 4 times daily  Current orders for Inpatient glycemic control:  Novolog 0-9 units tid with meals and HS   Inpatient Diabetes Program Recommendations:    Consider adding low dose basal insulin, Semglee 4 units daily.   Thanks,  Beryl Meager, RN, BC-ADM Inpatient Diabetes Coordinator Pager [phone]  (8a-5p)

## 2022-10-30 DIAGNOSIS — M726 Necrotizing fasciitis: Secondary | ICD-10-CM | POA: Diagnosis not present

## 2022-10-30 LAB — CBC
HCT: 26.2 % — ABNORMAL LOW (ref 36.0–46.0)
Hemoglobin: 8.6 g/dL — ABNORMAL LOW (ref 12.0–15.0)
MCH: 30 pg (ref 26.0–34.0)
MCHC: 32.8 g/dL (ref 30.0–36.0)
MCV: 91.3 fL (ref 80.0–100.0)
Platelets: 547 10*3/uL — ABNORMAL HIGH (ref 150–400)
RBC: 2.87 MIL/uL — ABNORMAL LOW (ref 3.87–5.11)
RDW: 14.8 % (ref 11.5–15.5)
WBC: 36.4 10*3/uL — ABNORMAL HIGH (ref 4.0–10.5)
nRBC: 0 % (ref 0.0–0.2)

## 2022-10-30 LAB — MAGNESIUM: Magnesium: 1.9 mg/dL (ref 1.7–2.4)

## 2022-10-30 LAB — GLUCOSE, CAPILLARY
Glucose-Capillary: 155 mg/dL — ABNORMAL HIGH (ref 70–99)
Glucose-Capillary: 212 mg/dL — ABNORMAL HIGH (ref 70–99)
Glucose-Capillary: 102 mg/dL — ABNORMAL HIGH (ref 70–99)
Glucose-Capillary: 125 mg/dL — ABNORMAL HIGH (ref 70–99)

## 2022-10-30 LAB — BASIC METABOLIC PANEL
Anion gap: 7 (ref 5–15)
BUN: 26 mg/dL — ABNORMAL HIGH (ref 6–20)
CO2: 21 mmol/L — ABNORMAL LOW (ref 22–32)
Calcium: 7.3 mg/dL — ABNORMAL LOW (ref 8.9–10.3)
Chloride: 103 mmol/L (ref 98–111)
Creatinine, Ser: 1.19 mg/dL — ABNORMAL HIGH (ref 0.44–1.00)
GFR, Estimated: >60 mL/min (ref >60)
Glucose, Bld: 164 mg/dL — ABNORMAL HIGH (ref 70–99)
Potassium: 5 mmol/L (ref 3.5–5.1)
Sodium: 131 mmol/L — ABNORMAL LOW (ref 135–145)

## 2022-10-30 MED ORDER — DIPHENHYDRAMINE HCL 50 MG/ML IJ SOLN
25.0000 mg | Freq: Once | INTRAMUSCULAR | Status: AC
  Administered 2022-10-30: 25 mg via INTRAVENOUS
  Filled 2022-10-30: qty 1

## 2022-10-30 NOTE — Plan of Care (Signed)
  Problem: Education: Goal: Knowledge of General Education information will improve Description Including pain rating scale, medication(s)/side effects and non-pharmacologic comfort measures Outcome: Progressing   

## 2022-10-30 NOTE — Progress Notes (Signed)
Occupational Therapy Treatment Patient Details Name: Kelli Hudson MRN: [id number] DOB: February 20, 1990 Today's Date: 10/30/2022   History of present illness Patient is 32 y.o. female presented to hospital 10/03/22 for bil foot wounds, fever, and pain for ~2 weeks with no improvement with antibiotics. Pt admitted for sepsis with necrotizing fasciitis/gangrene bilateral feet elected for surgical management. Pt underwent excision and debridement of BLE extending feet to thighs on 10/08/22 and remained intubated in ICU. 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 PMH significant for DM, HTN, gastric pacemaker.   OT comments  Pt remains highly motivated and making steady gains in mobility. Able to perform A-P transfer bed to [facility] with moderate assistance to have BM, transferred back to bed with +2 assist and rolled for pericare and to place sacral patch on buttocks. Pt requesting to perform lateral scoot transfer to w/c. Propelled w/c down hall with assist for managing wound vac. Pt assisted back to bed with pillow under R hip to off load buttocks. Instructed in importance of frequent pressure relief, pt verbalized understanding. Patient will benefit from continued inpatient follow up therapy, <3 hours/day       If plan is discharge home, recommend the following:  Two people to help with walking and/or transfers;A lot of help with bathing/dressing/bathroom;Assistance with cooking/housework;Assist for transportation;Help with stairs or ramp for entrance   Equipment Recommendations  Wheelchair (measurements OT);Wheelchair cushion (measurements OT) (drop arm commode)    Recommendations for Other Services      Precautions / Restrictions Precautions Precautions: Fall Precaution Comments: bil LE wound vac Restrictions Weight Bearing Restrictions: Yes RLE Weight Bearing: Non weight bearing LLE Weight Bearing: Non weight bearing Other Position/Activity Restrictions: bil BKA        Mobility Bed Mobility Overal bed mobility: Needs Assistance Bed Mobility: Rolling, Supine to Sit, Sit to Sidelying Rolling: Modified independent (Device/Increase time)   Supine to sit: Contact guard, Used rails   Sit to sidelying: Modified independent (Device/Increase time)      Transfers Overall transfer level: Needs assistance   Transfers: Bed to chair/wheelchair/BSC         Anterior-Posterior transfers: +2 physical assistance, Mod assist  Lateral/Scoot Transfers: +2 physical assistance, Max assist General transfer comment: pt up with OT on BSC on entry, pt requires maxAx2 for alternating butt walking forward off the BSC to the bed, vc for not digging residual limbs into bed surface to pull herself forward to protect surgical site for optimal prosthetic fit in future. once back in bed performed lateral scoot transfer from elevated bed to wheelchair on her L, requiring cuing for sequencing and forward lean and maxAx2, at end of session pt with increased pain and fatigue as well as need to transfer to higher bed surface. pt able to particpate in sequencing but ultimately requires totalAx2 for getting back to bed     Balance Overall balance assessment: Needs assistance Sitting-balance support: Feet unsupported, Bilateral upper extremity supported Sitting balance-Leahy Scale: Fair                                     ADL either performed or assessed with clinical judgement   ADL Overall ADL's : Needs assistance/impaired     Grooming: Wash/dry hands;Wash/dry face;Oral care;Set up;Bed level                   Toilet Transfer: Moderate assistance;Anterior/posterior;BSC/3in1;+2 for physical assistance Toilet Transfer  Details (indicate cue type and reason): bed to [facility] with mod assist, +2 mod back to bed Toileting- Clothing Manipulation and Hygiene: Total assistance;Bed level Toileting - Clothing Manipulation Details (indicate cue type and reason): returned to  bed and rolled for pericare and to place sacral pad     Functional mobility during ADLs: Wheelchair (assist for wound vacs)      Extremity/Trunk Assessment              Vision       Perception     Praxis      Cognition Arousal: Alert Behavior During Therapy: Flat affect Overall Cognitive Status: Within Functional Limits for tasks assessed                                 General Comments: pt recalled exercises from previous PT visit, encouraged pt to direct her care with staff        Exercises      Shoulder Instructions       General Comments RN made aware of rash on arms and face, blood from wound above wound vac on R LE, pressure area on buttocks    Pertinent Vitals/ Pain       Pain Assessment Pain Assessment: Faces Faces Pain Scale: Hurts whole lot Pain Location: B LEs, buttocks Pain Descriptors / Indicators: Grimacing, Tender, Throbbing, Aching, Guarding  Home Living                                          Prior Functioning/Environment              Frequency  Min 1X/week        Progress Toward Goals  OT Goals(current goals can now be found in the care plan section)  Progress towards OT goals: Progressing toward goals  Acute Rehab OT Goals OT Goal Formulation: With patient Time For Goal Achievement: 11/08/22 Potential to Achieve Goals: Good  Plan      Co-evaluation    PT/OT/SLP Co-Evaluation/Treatment: Yes Reason for Co-Treatment: For patient/therapist safety PT goals addressed during session: Mobility/safety with mobility;Strengthening/ROM OT goals addressed during session: ADL's and self-care;Strengthening/ROM      AM-PAC OT "6 Clicks" Daily Activity     Outcome Measure   Help from another person eating meals?: None Help from another person taking care of personal grooming?: A Little Help from another person toileting, which includes using toliet, bedpan, or urinal?: Total Help from another  person bathing (including washing, rinsing, drying)?: A Lot Help from another person to put on and taking off regular upper body clothing?: A Little Help from another person to put on and taking off regular lower body clothing?: A Lot 6 Click Score: 15    End of Session    OT Visit Diagnosis: Muscle weakness (generalized) (M62.81);Pain   Activity Tolerance Patient tolerated treatment well   Patient Left in bed;with call bell/phone within reach;Other (comment) (on L side)   Nurse Communication Other (comment) (rash on arms and face, pressure wound on buttocks, BM)        Time: 6195-0932 OT Time Calculation (min): 79 min  Charges: OT General Charges $OT Visit: 1 Visit OT Treatments $Self Care/Home Management : 38-52 mins  Berna Spare, OTR/L Acute Rehabilitation Services Office: [phone]   Evern Bio 10/30/2022, 11:30 AM

## 2022-10-30 NOTE — Progress Notes (Signed)
PROGRESS NOTE    Kelli Hudson  NWG:[id number] DOB: 1990-04-25 DOA: 10/03/2022 PCP: Norm Salt, PA    Brief Narrative:  32 y.o. female with medical history significant of DM1, diabetic gastroparesis with very frequent admission in the past, HTN presented w/ c/o B foot wounds, fever, and pain and informed that she has been dealing with lower extremity wound for past 2 weeks ,followed with her podiatrist in the outpatient setting at [facility] there which she recently finished a course of doxycycline which did not help her symptoms, initially had a blister that ruptured subsequently with redness streaking up her legs and noticing fever chills worsening pain in last few days.    Patient admitted for sepsis due to cellulitis/lower extremity wound, severe anemia, AKI. Podiatry consulted- Unable to complete MRI as gastric pacemaker is not MRI safe.  ID and Ortho were consulted.  Patient went to the OR for bilateral lower extremity necrotizing fasciitis feet to his thigh, remained intubated and required PRBC and FFP transfusion during the case on 8/21.  Eventually extubated on 8/23 and was taken back to the OR on 8/26 for wound VAC change.  Patient has remained on IV antibiotics.  Also underwent bilateral lower extremity BKA by Dr. Lajoyce Corners on 9/6.    Assessment & Plan:   Principal Problem:   Necrotizing fasciitis (HCC) Active Problems:   AKI (acute kidney injury) (HCC)   Sepsis due to cellulitis (HCC)   Cellulitis of lower extremity   Anemia   Diabetic gastroparesis associated with type 1 diabetes mellitus (HCC)   DM type 1 (diabetes mellitus, type 1) (HCC)   Drug-seeking behavior   Sepsis (HCC)   Necrotizing fasciitis of pelvic region and thigh (HCC)   Necrotizing fasciitis of lower leg (HCC)   MDD (major depressive disorder), recurrent episode, moderate (HCC)   Severe sepsis due to bilateral feet abscess, necrotizing fasciitis, POA.  MRSE, sensitive Pseudomonas S/p  bilateral excisional leg and thigh debridement Initially underwent I&D on 8/21 with wound VAC change on 8/26 but ultimately required bilateral BKA by orthopedic on 9/6. Per ID, no further antibiotic needs   Acute blood loss anemia due to operative blood loss Secondary to blood loss.  Required multiple units of PRBC and FFP.  Hemoglobin now 8.6   Type 1 diabetes, poorly controlled with hyperglycemia and complicated with gastropathy Continue current sliding scale and Accu-Chek.  Adjust as necessary   Acute kidney injury on CKD stage IIIa due to septic ATN, improving Non-anion gap metabolic acidosis Hyponatremia, euvolemic Resolved now back to baseline creatinine 1.16.  Peaked at 2.54   Severe protein calorie malnutrition Cortrak removed 9/9.  Encouraging oral diet, dietitian following.  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  On Lasix  Hypomagnesemia - PRN repletion   PT/OT-SNF   DVT prophylaxis: SCD   Code Status: Full Code  Family Communication: None   Status is: Inpatient Remains inpatient appropriate because: Recovering from surgery, awaiting placement in the meantime hoping good nutrition continues to improve           Subjective: Seen at bedside, no new complaints at this time.   Examination:  General exam: Appears calm and comfortable  Respiratory system: Clear to auscultation. Respiratory effort normal. Cardiovascular system: S1 & S2 heard, RRR. No JVD, murmurs, rubs, gallops or  clicks. No pedal edema. Gastrointestinal system: Abdomen is nondistended, soft and nontender. No organomegaly or masses felt. Normal bowel sounds heard. Central nervous system: Alert and oriented. No focal neurological deficits. Extremities: Bilateral BKA Skin: No rashes, lesions or ulcers Psychiatry:  Judgement and insight appear normal. Mood & affect appropriate.   Pressure Injury 10/07/22 Elbow Posterior;Right Stage 2 -  Partial thickness loss of dermis presenting as a shallow open injury with a red, pink wound bed without slough. (Active)  10/07/22 1226  Location: Elbow  Location Orientation: Posterior;Right  Staging: Stage 2 -  Partial thickness loss of dermis presenting as a shallow open injury with a red, pink wound bed without slough.  Wound Description (Comments):   Present on Admission:      Diet Orders (From admission, onward)     Start     Ordered   10/26/22 1234  DIET SOFT Room service appropriate? Yes; Fluid consistency: Thin  Diet effective now       Question Answer Comment  Room service appropriate? Yes   Fluid consistency: Thin      10/26/22 1233            Objective: Vitals:   10/29/22 1458 10/29/22 2034 10/30/22 0406 10/30/22 0734  BP: (!) 144/97 (!) 164/91 (!) 156/90 135/84  Pulse:  (!) 122 (!) 115 (!) 110  Resp: 19 18 18 18   Temp: 98.8 F (37.1 C) 99.1 F (37.3 C) 98.8 F (37.1 C) 98.3 F (36.8 C)  TempSrc: Oral Oral Oral Oral  SpO2: 100% 100% 100% 99%  Weight:      Height:        Intake/Output Summary (Last 24 hours) at 10/30/2022 1243 Last data filed at 10/29/2022 1705 Gross per 24 hour  Intake 480 ml  Output --  Net 480 ml   Filed Weights   10/21/22 0500 10/22/22 0500 10/24/22 0500  Weight: 99.1 kg 99 kg 99 kg    Scheduled Meds:  sodium chloride   Intravenous Once   acetaminophen  1,000 mg Oral Q6H   vitamin C  1,000 mg Oral Daily   Chlorhexidine Gluconate Cloth  6 each Topical Daily   docusate sodium  100 mg Oral BID   DULoxetine  30 mg Oral Daily   folic acid  1 mg Oral Daily   furosemide  40 mg Oral Daily   gabapentin  300 mg Oral Q8H   heparin injection (subcutaneous)  5,000 Units Subcutaneous Q8H   insulin aspart  0-5 Units Subcutaneous QHS   insulin aspart  0-9 Units Subcutaneous TID WC   insulin glargine-yfgn  4  Units Subcutaneous Daily   methocarbamol  1,000 mg Oral TID   metoCLOPramide  10 mg Oral Q8H   multivitamin with minerals  1 tablet Oral Daily   nutrition supplement (JUVEN)  1 packet Oral BID BM   omeprazole  40 mg Oral Daily   mouth rinse  15 mL Mouth Rinse 4 times per day   silver nitrate applicators  1 Application Topical Once   sodium chloride flush  10-40 mL Intracatheter Q12H   zinc sulfate  220 mg Oral Daily   Continuous Infusions:  sodium chloride 10 mL/hr at 10/25/22 0517   magnesium sulfate bolus IVPB      Nutritional status Signs/Symptoms: estimated needs Interventions: Juven, Glucerna shake Body mass index is 34.18 kg/m.  Data Reviewed:   CBC: Recent Labs  Lab 10/26/22 0310 10/27/22 0415 10/28/22 0326 10/29/22 0325 10/30/22 0412  WBC 32.3* 33.8*  38.3* 32.7* 36.4*  HGB 7.2* 8.0* 7.3* 8.4* 8.6*  HCT 22.2* 24.6* 22.2* 25.7* 26.2*  MCV 90.2 92.1 91.4 91.8 91.3  PLT 361 410* 428* 481* 547*   Basic Metabolic Panel: Recent Labs  Lab 10/24/22 0327 10/25/22 0344 10/26/22 0310 10/28/22 0326 10/29/22 0325 10/30/22 0412  NA 132* 130* 132* 133* 130* 131*  K 4.6 4.4 4.7 4.4 4.9 5.0  CL 108 106 107 106 102 103  CO2 19* 19* 19* 23 22 21*  GLUCOSE 152* 92 120* 57* 239* 164*  BUN 80* 68* 50* 35* 32* 26*  CREATININE 1.82* 1.63* 1.48* 1.16* 1.17* 1.19*  CALCIUM 7.9* 7.3* 7.3* 7.4* 7.1* 7.3*  MG 1.9  --   --  1.5* 1.6* 1.9  PHOS 3.5  --   --  4.2  --   --    GFR: Estimated Creatinine Clearance: 82.8 mL/min (A) (by C-G formula based on SCr of 1.19 mg/dL (H)). Liver Function Tests: No results for input(s): "AST", "ALT", "ALKPHOS", "BILITOT", "PROT", "ALBUMIN" in the last 168 hours. No results for input(s): "LIPASE", "AMYLASE" in the last 168 hours. No results for input(s): "AMMONIA" in the last 168 hours. Coagulation Profile: No results for input(s): "INR", "PROTIME" in the last 168 hours. Cardiac Enzymes: No results for input(s): "CKTOTAL", "CKMB",  "CKMBINDEX", "TROPONINI" in the last 168 hours. BNP (last 3 results) No results for input(s): "PROBNP" in the last 8760 hours. HbA1C: No results for input(s): "HGBA1C" in the last 72 hours. CBG: Recent Labs  Lab 10/29/22 1122 10/29/22 1606 10/29/22 2035 10/30/22 0732 10/30/22 1127  GLUCAP 177* 148* 155* 155* 212*   Lipid Profile: No results for input(s): "CHOL", "HDL", "LDLCALC", "TRIG", "CHOLHDL", "LDLDIRECT" in the last 72 hours. Thyroid Function Tests: No results for input(s): "TSH", "T4TOTAL", "FREET4", "T3FREE", "THYROIDAB" in the last 72 hours. Anemia Panel: No results for input(s): "VITAMINB12", "FOLATE", "FERRITIN", "TIBC", "IRON", "RETICCTPCT" in the last 72 hours. Sepsis Labs: No results for input(s): "PROCALCITON", "LATICACIDVEN" in the last 168 hours.  Recent Results (from the past 240 hour(s))  Surgical pcr screen     Status: None   Collection Time: 10/23/22  9:05 PM   Specimen: Nasal Mucosa; Nasal Swab  Result Value Ref Range Status   MRSA, PCR NEGATIVE NEGATIVE Final   Staphylococcus aureus NEGATIVE NEGATIVE Final    Comment: (NOTE) The Xpert SA Assay (FDA approved for NASAL specimens in patients 56 years of age and older), is one component of a comprehensive surveillance program. It is not intended to diagnose infection nor to guide or monitor treatment. Performed at [facility], [address], [place], [place] [address]   Aerobic/Anaerobic Culture w Gram Stain (surgical/deep wound)     Status: None   Collection Time: 10/24/22  2:43 PM   Specimen: Path fluid; Body Fluid  Result Value Ref Range Status   Specimen Description FLUID  Final   Special Requests right knee joint fluid  Final   Gram Stain   Final    RARE WBC PRESENT, PREDOMINANTLY PMN NO ORGANISMS SEEN    Culture   Final    No growth aerobically or anaerobically. Performed at [facility], [address] [place], [place] [address]    Report Status 10/29/2022 FINAL  Final          Radiology Studies: No results found.         LOS: 27 days   Time spent= 35 mins    Miguel Rota, MD [facility]  If 7PM-7AM, please contact night-coverage  10/30/2022, 12:43 PM

## 2022-10-30 NOTE — TOC Progression Note (Addendum)
Transition of Care [facility]) - Progression Note    Patient Details  Name: Kelli Hudson MRN: [id number] Date of Birth: 01-11-1991  Transition of Care [facility]) CM/SW Contact  Lorri Frederick, LCSW Phone Number: 10/30/2022, 1:21 PM  Clinical Narrative:   CSW message from [facility].  They can accept pt but no medicaid beds available at [facility] or [place].  Nothing expected to open up.  They can extend bed offer for [facility].  CSW spoke with pt mother [name], updated her that [facility] has rescinded their offers.  Discussed no other [place] offers, does have offer at [facility].  [name] would much prefer [place] site as difficult for multiple family members to get to [place].    1300: CSW spoke with [facility].  She will re-review pt for potential bed offer.   CSW spoke with [facility]. She cannot make bed offer.  CSW LM with [facility].  1400: TC [facility].  No medicaid beds.   1600: [facility] still unable to offer bed.     Expected Discharge Plan: Skilled Nursing Facility Barriers to Discharge: Continued Medical Work up, SNF Pending bed offer  Expected Discharge Plan and Services In-house Referral: Clinical Social Work   Post Acute Care Choice: Skilled Nursing Facility Living arrangements for the past 2 months: Apartment                                       Social Determinants of Health (SDOH) Interventions SDOH Screenings   Food Insecurity: No Food Insecurity (10/03/2022)  Housing: Low Risk  (10/03/2022)  Transportation Needs: No Transportation Needs (10/03/2022)  Utilities: Not At Risk (10/03/2022)  Alcohol Screen: Low Risk  (05/29/2021)  Depression (PHQ2-9): Low Risk  (05/29/2021)  Recent Concern: Depression (PHQ2-9) - Medium Risk (04/10/2021)  Financial Resource Strain: Low Risk  (02/13/2022)   [facility]  Social Connections: Unknown (06/17/2021)   Received from [facility]  Stress: No Stress Concern Present (09/16/2022)   Received from [facility]  Tobacco Use: Low Risk  (10/24/2022)    Readmission Risk Interventions    05/08/2022    9:45 AM 03/24/2022    3:33 PM 12/12/2021    8:59 AM  Readmission Risk Prevention Plan  Transportation Screening Complete Complete Complete  Medication Review Oceanographer) Complete Complete Complete  PCP or Specialist appointment within 3-5 days of discharge Complete Complete Complete  HRI or Home Care Consult Complete Complete Complete  SW Recovery Care/Counseling Consult Complete Complete Complete  Palliative Care Screening Not Applicable Not Applicable Not Applicable  Skilled Nursing Facility Not Applicable Not Applicable Not Applicable

## 2022-10-30 NOTE — Progress Notes (Signed)
Physical Therapy Treatment Patient Details Name: Kelli Hudson MRN: [id number] DOB: [birth year] Today's Date: 10/30/2022   History of Present Illness Patient is 32 y.o. female presented to hospital 10/03/22 for bil foot wounds, fever, and pain for ~2 weeks with no improvement with antibiotics. Pt admitted for sepsis with necrotizing fasciitis/gangrene bilateral feet elected for surgical management. Pt underwent excision and debridement of BLE extending feet to thighs on 10/08/22 and remained intubated in ICU. 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 PMH significant for DM, HTN, gastric pacemaker.    PT Comments  Pt making excellent progress with therapy today. Pt is up on [facility] with OT assist on entry. Pt requires maxAx2 for scooting back to bed with use of bed pad for pericare. Once cleaned up able to perform sidelying exercises. Pt noted to have small area of skin break down on her sacrum. Sacral foam placed and RN notified. Pt requires maxAx2 for lateral transfer from bed to wheelchair. Once up in wheelchair with minor education, pt able to propel wheelchair independently. Pt requires total Ax2 for return to bed due to pain and fatigue. D/c plan remains appropriate at this time. PT will continue to follow acutely.     If plan is discharge home, recommend the following: Two people to help with walking and/or transfers;Two people to help with bathing/dressing/bathroom;Assistance with cooking/housework;Assist for transportation;Help with stairs or ramp for entrance;Supervision due to cognitive status   Can travel by private vehicle     No  Equipment Recommendations  Wheelchair (measurements PT);Wheelchair cushion (measurements PT);Other (comment) (TBD at next venue)       Precautions / Restrictions Precautions Precautions: Fall Precaution Comments: bil wound vac Restrictions Weight Bearing Restrictions: Yes RLE Weight Bearing: Non weight bearing LLE Weight Bearing: Non  weight bearing Other Position/Activity Restrictions: bil BKA     Mobility  Bed Mobility Overal bed mobility: Needs Assistance Bed Mobility: Supine to Sit, Rolling Rolling: Modified independent (Device/Increase time)   Supine to sit: Contact guard, Used rails     General bed mobility comments: able to come to long sitting in bed with use of bed rails and is mod I for rolling side to side with use of bed rails    Transfers Overall transfer level: Needs assistance   Transfers: Bed to chair/wheelchair/BSC         Anterior-Posterior transfers: Max assist, +2 physical assistance, +2 safety/equipment, From elevated surface  Lateral/Scoot Transfers: Max assist General transfer comment: pt up with OT on BSC on entry, pt requires maxAx2 for alternating butt walking forward off the BSC to the bed, vc for not digging residual limbs into bed surface to pull herself forward to protect surgical site for optimal prosthetic fit in future. once back in bed performed lateral scoot transfer from elevated bed to wheelchair on her L, requiring cuing for sequencing and forward lean and maxAx2, at end of session pt with increased pain and fatigue as well as need to transfer to higher bed surface. pt able to particpate in sequencing but ultimately requires totalAx2 for getting back to bed     Wheelchair Mobility Wheelchair Mobility Wheelchair mobility: Yes Wheelchair propulsion: Both upper extremities Wheelchair parts: Needs assistance Distance: 200 Wheelchair Assistance Details (indicate cue type and reason): vc for use placing leg rests, and for bilateral use of UE for forward propulsion, assist for return to room and management of wheelchair in tight confines of the room       Balance Overall balance assessment:  Needs assistance Sitting-balance support: Feet unsupported, Bilateral upper extremity supported Sitting balance-Leahy Scale: Fair Sitting balance - Comments: reliant on support at start  for static balance. fading to CGA.                                    Cognition Arousal: Alert Behavior During Therapy: Flat affect Overall Cognitive Status: Within Functional Limits for tasks assessed                                          Exercises Amputee Exercises Knee Extension: AROM, Right, 5 reps, Sidelying    General Comments General comments (skin integrity, edema, etc.): pt reported pain in sacral region while up on BSC, with return to bed and pt in sidelying pt noted to have area of skin breakdown, applied sacral foam and notified RN provided pt with education on sacral wounds and positioning herself throughout the day. will also get Geomat for sitting in recliner.      Pertinent Vitals/Pain Pain Assessment Pain Assessment: Faces Faces Pain Scale: Hurts worst Pain Location: bil LE with movement Pain Descriptors / Indicators: Grimacing, Tender, Throbbing, Aching, Guarding Pain Intervention(s): Monitored during session, Premedicated before session, Repositioned, Patient requesting pain meds-RN notified, RN gave pain meds during session     PT Goals (current goals can now be found in the care plan section) Acute Rehab PT Goals Patient Stated Goal: regain independence and ability to walk with prosthetics PT Goal Formulation: With patient Time For Goal Achievement: 11/08/22 Potential to Achieve Goals: Good Progress towards PT goals: Progressing toward goals    Frequency    Min 1X/week           Co-evaluation PT/OT/SLP Co-Evaluation/Treatment: Yes Reason for Co-Treatment: For patient/therapist safety PT goals addressed during session: Mobility/safety with mobility;Strengthening/ROM OT goals addressed during session: ADL's and self-care      AM-PAC PT "6 Clicks" Mobility   Outcome Measure  Help needed turning from your back to your side while in a flat bed without using bedrails?: A Lot Help needed moving from lying on  your back to sitting on the side of a flat bed without using bedrails?: Total Help needed moving to and from a bed to a chair (including a wheelchair)?: Total Help needed standing up from a chair using your arms (e.g., wheelchair or bedside chair)?: Total Help needed to walk in hospital room?: Total Help needed climbing 3-5 steps with a railing? : Total 6 Click Score: 7    End of Session   Activity Tolerance: Patient limited by pain;Patient tolerated treatment well Patient left: in bed;with bed alarm set;with call bell/phone within reach;Other (comment) (rotated to L hip) Nurse Communication: Mobility status (2+) PT Visit Diagnosis: Muscle weakness (generalized) (M62.81);Difficulty in walking, not elsewhere classified (R26.2);Other symptoms and signs involving the nervous system (R29.898);Other abnormalities of gait and mobility (R26.89);Unsteadiness on feet (R26.81);Pain Pain - Right/Left:  (bil) Pain - part of body: Leg     Time: 1610-9604 PT Time Calculation (min) (ACUTE ONLY): 53 min  Charges:    $Therapeutic Activity: 8-22 mins $Wheel Chair Management: 8-22 mins PT General Charges $$ ACUTE PT VISIT: 1 Visit                     Marlean Mortell B. Beverely Risen PT, DPT Acute  Rehabilitation Services Please use secure chat or  Call Office [phone]    Elon Alas [facility] 10/30/2022, 11:20 AM

## 2022-10-31 DIAGNOSIS — M726 Necrotizing fasciitis: Secondary | ICD-10-CM | POA: Diagnosis not present

## 2022-10-31 LAB — BASIC METABOLIC PANEL
Anion gap: 11 (ref 5–15)
BUN: 27 mg/dL — ABNORMAL HIGH (ref 6–20)
CO2: 22 mmol/L (ref 22–32)
Calcium: 7.4 mg/dL — ABNORMAL LOW (ref 8.9–10.3)
Chloride: 101 mmol/L (ref 98–111)
Creatinine, Ser: 1.34 mg/dL — ABNORMAL HIGH (ref 0.44–1.00)
GFR, Estimated: 54 mL/min — ABNORMAL LOW (ref >60)
Glucose, Bld: 243 mg/dL — ABNORMAL HIGH (ref 70–99)
Potassium: 5.4 mmol/L — ABNORMAL HIGH (ref 3.5–5.1)
Sodium: 134 mmol/L — ABNORMAL LOW (ref 135–145)

## 2022-10-31 LAB — CBC
HCT: 23.8 % — ABNORMAL LOW (ref 36.0–46.0)
Hemoglobin: 7.6 g/dL — ABNORMAL LOW (ref 12.0–15.0)
MCH: 29.2 pg (ref 26.0–34.0)
MCHC: 31.9 g/dL (ref 30.0–36.0)
MCV: 91.5 fL (ref 80.0–100.0)
Platelets: 554 10*3/uL — ABNORMAL HIGH (ref 150–400)
RBC: 2.6 MIL/uL — ABNORMAL LOW (ref 3.87–5.11)
RDW: 14.6 % (ref 11.5–15.5)
WBC: 39.6 10*3/uL — ABNORMAL HIGH (ref 4.0–10.5)
nRBC: 0 % (ref 0.0–0.2)

## 2022-10-31 LAB — GLUCOSE, CAPILLARY
Glucose-Capillary: 194 mg/dL — ABNORMAL HIGH (ref 70–99)
Glucose-Capillary: 189 mg/dL — ABNORMAL HIGH (ref 70–99)
Glucose-Capillary: 81 mg/dL (ref 70–99)
Glucose-Capillary: 80 mg/dL (ref 70–99)

## 2022-10-31 LAB — MAGNESIUM: Magnesium: 1.6 mg/dL — ABNORMAL LOW (ref 1.7–2.4)

## 2022-10-31 LAB — CALCIUM, IONIZED: Calcium, Ionized, Serum: 4.5 mg/dL (ref 4.5–5.6)

## 2022-10-31 LAB — SEDIMENTATION RATE: Sed Rate: 84 mm/h — ABNORMAL HIGH (ref 0–22)

## 2022-10-31 LAB — C-REACTIVE PROTEIN: CRP: 18.6 mg/dL — ABNORMAL HIGH (ref <1.0)

## 2022-10-31 MED ORDER — DIPHENHYDRAMINE HCL 25 MG PO CAPS
25.0000 mg | ORAL_CAPSULE | Freq: Three times a day (TID) | ORAL | Status: AC
  Administered 2022-10-31 – 2022-11-01 (×3): 25 mg via ORAL
  Filled 2022-10-31 (×3): qty 1

## 2022-10-31 MED ORDER — MAGNESIUM SULFATE 4 GM/100ML IV SOLN
4.0000 g | Freq: Once | INTRAVENOUS | Status: AC
  Administered 2022-10-31: 4 g via INTRAVENOUS
  Filled 2022-10-31: qty 100

## 2022-10-31 MED ORDER — HYDROMORPHONE HCL 1 MG/ML IJ SOLN
0.5000 mg | Freq: Three times a day (TID) | INTRAMUSCULAR | Status: DC | PRN
  Administered 2022-10-31 – 2022-11-01 (×3): 0.5 mg via INTRAVENOUS
  Filled 2022-10-31 (×3): qty 0.5

## 2022-10-31 NOTE — Plan of Care (Signed)
  Problem: Education: Goal: Knowledge of General Education information will improve Description: Including pain rating scale, medication(s)/side effects and non-pharmacologic comfort measures Outcome: Progressing   Problem: Health Behavior/Discharge Planning: Goal: Ability to manage health-related needs will improve Outcome: Progressing   Problem: Clinical Measurements: Goal: Ability to maintain clinical measurements within normal limits will improve Outcome: Progressing Goal: Will remain free from infection Outcome: Progressing Goal: Diagnostic test results will improve Outcome: Progressing Goal: Respiratory complications will improve Outcome: Progressing Goal: Cardiovascular complication will be avoided Outcome: Progressing   Problem: Activity: Goal: Risk for activity intolerance will decrease Outcome: Progressing   Problem: Nutrition: Goal: Adequate nutrition will be maintained Outcome: Progressing   Problem: Coping: Goal: Level of anxiety will decrease Outcome: Progressing   Problem: Elimination: Goal: Will not experience complications related to bowel motility Outcome: Progressing Goal: Will not experience complications related to urinary retention Outcome: Progressing   Problem: Pain Managment: Goal: General experience of comfort will improve Outcome: Progressing   Problem: Safety: Goal: Ability to remain free from injury will improve Outcome: Progressing   Problem: Skin Integrity: Goal: Risk for impaired skin integrity will decrease Outcome: Progressing   Problem: Education: Goal: Ability to describe self-care measures that may prevent or decrease complications (Diabetes Survival Skills Education) will improve Outcome: Progressing   Problem: Coping: Goal: Ability to adjust to condition or change in health will improve Outcome: Progressing   Problem: Fluid Volume: Goal: Ability to maintain a balanced intake and output will improve Outcome:  Progressing   Problem: Health Behavior/Discharge Planning: Goal: Ability to identify and utilize available resources and services will improve Outcome: Progressing Goal: Ability to manage health-related needs will improve Outcome: Progressing   Problem: Metabolic: Goal: Ability to maintain appropriate glucose levels will improve Outcome: Progressing   Problem: Nutritional: Goal: Maintenance of adequate nutrition will improve Outcome: Progressing Goal: Progress toward achieving an optimal weight will improve Outcome: Progressing   Problem: Skin Integrity: Goal: Risk for impaired skin integrity will decrease Outcome: Progressing   Problem: Tissue Perfusion: Goal: Adequacy of tissue perfusion will improve Outcome: Progressing   Problem: Education: Goal: Knowledge of the prescribed therapeutic regimen will improve Outcome: Progressing Goal: Ability to verbalize activity precautions or restrictions will improve Outcome: Progressing Goal: Understanding of discharge needs will improve Outcome: Progressing   Problem: Activity: Goal: Ability to perform//tolerate increased activity and mobilize with assistive devices will improve Outcome: Progressing   Problem: Clinical Measurements: Goal: Postoperative complications will be avoided or minimized Outcome: Progressing   Problem: Self-Care: Goal: Ability to meet self-care needs will improve Outcome: Progressing   Problem: Self-Concept: Goal: Ability to maintain and perform role responsibilities to the fullest extent possible will improve Outcome: Progressing   Problem: Pain Management: Goal: Pain level will decrease with appropriate interventions Outcome: Progressing   Problem: Skin Integrity: Goal: Demonstration of wound healing without infection will improve Outcome: Progressing   Problem: Activity: Goal: Ability to tolerate increased activity will improve Outcome: Progressing   Problem: Respiratory: Goal: Ability  to maintain a clear airway and adequate ventilation will improve Outcome: Progressing   Problem: Role Relationship: Goal: Method of communication will improve Outcome: Progressing

## 2022-10-31 NOTE — TOC Progression Note (Signed)
Transition of Care [facility]) - Progression Note    Patient Details  Name: Kelli Hudson MRN: [id number] Date of Birth: 08-31-1990  Transition of [facility] CM/SW Contact  Lorri Frederick, LCSW Phone Number: 10/31/2022, 10:22 AM  Clinical Narrative:   No other bed offers.  CSW spoke with pt and with mother by phone, discussed bed offer at [facility] and they are agreeable to this.    CSW reached out to [employer] and they will start insurance auth.      Expected Discharge Plan: Skilled Nursing Facility Barriers to Discharge: Continued Medical Work up, SNF Pending bed offer  Expected Discharge Plan and Services In-house Referral: Clinical Social Work   Post Acute Care Choice: Skilled Nursing Facility Living arrangements for the past 2 months: Apartment                                       Social Determinants of Health (SDOH) Interventions SDOH Screenings   Food Insecurity: No Food Insecurity (10/03/2022)  Housing: Low Risk  (10/03/2022)  Transportation Needs: No Transportation Needs (10/03/2022)  Utilities: Not At Risk (10/03/2022)  Alcohol Screen: Low Risk  (05/29/2021)  Depression (PHQ2-9): Low Risk  (05/29/2021)  Recent Concern: Depression (PHQ2-9) - Medium Risk (04/10/2021)  Financial Resource Strain: Low Risk  (02/13/2022)   Received from [facility], [facility]  Social Connections: Unknown (06/17/2021)   Received from [facility], [facility]  Stress: No Stress Concern Present (09/16/2022)   Received from [facility]  Tobacco Use: Low Risk  (10/24/2022)    Readmission Risk Interventions    05/08/2022    9:45 AM 03/24/2022    3:33 PM 12/12/2021    8:59 AM  Readmission Risk Prevention Plan  Transportation Screening Complete Complete Complete  Medication Review [employer]) Complete Complete Complete  PCP or Specialist appointment within 3-5 days of discharge Complete Complete Complete  HRI or Home Care Consult Complete Complete Complete   SW Recovery Care/Counseling Consult Complete Complete Complete  Palliative Care Screening Not Applicable Not Applicable Not Applicable  Skilled Nursing Facility Not Applicable Not Applicable Not Applicable

## 2022-10-31 NOTE — Progress Notes (Signed)
PROGRESS NOTE    Kelli Hudson  ZOX:[id number] DOB: March 28, 1990 DOA: 10/03/2022 PCP: Norm Salt, PA    Brief Narrative:  32 y.o. female with medical history significant of DM1, diabetic gastroparesis with very frequent admission in the past, HTN presented w/ c/o B foot wounds, fever, and pain and informed that she has been dealing with lower extremity wound for past 2 weeks ,followed with her podiatrist in the outpatient setting at [facility] there which she recently finished a course of doxycycline which did not help her symptoms, initially had a blister that ruptured subsequently with redness streaking up her legs and noticing fever chills worsening pain in last few days.    Patient admitted for sepsis due to cellulitis/lower extremity wound, severe anemia, AKI. Podiatry consulted- Unable to complete MRI as gastric pacemaker is not MRI safe.  ID and Ortho were consulted.  Patient went to the OR for bilateral lower extremity necrotizing fasciitis feet to his thigh, remained intubated and required PRBC and FFP transfusion during the case on 8/21.  Eventually extubated on 8/23 and was taken back to the OR on 8/26 for wound VAC change.  Patient has remained on IV antibiotics.  Also underwent bilateral lower extremity BKA by Dr. Lajoyce Corners on 9/6.    Assessment & Plan:   Principal Problem:   Necrotizing fasciitis (HCC) Active Problems:   AKI (acute kidney injury) (HCC)   Sepsis due to cellulitis (HCC)   Cellulitis of lower extremity   Anemia   Diabetic gastroparesis associated with type 1 diabetes mellitus (HCC)   DM type 1 (diabetes mellitus, type 1) (HCC)   Drug-seeking behavior   Sepsis (HCC)   Necrotizing fasciitis of pelvic region and thigh (HCC)   Necrotizing fasciitis of lower leg (HCC)   MDD (major depressive disorder), recurrent episode, moderate (HCC)   Severe sepsis due to bilateral feet abscess, necrotizing fasciitis, POA.  MRSE, sensitive Pseudomonas S/p  bilateral transtibial amputation Initially underwent I&D on 8/21 with wound VAC change on 8/26 but ultimately required bilateral BKA by orthopedic on 9/6. Per ID, no further antibiotic needs.  Inflammatory markers still elevated   Acute blood loss anemia due to operative blood loss Secondary to blood loss requiring multiple [place] PRBC and FFP.  Hemoglobin again drifting down a little bit this morning.  I will go ahead and order iron studies as patient may require IV iron.   Type 1 diabetes, poorly controlled with hyperglycemia and complicated with gastropathy Continue current sliding scale and Accu-Chek.  Adjust as necessary   Acute kidney injury on CKD stage IIIa due to septic ATN, improving Non-anion gap metabolic acidosis Hyponatremia, euvolemic Resolved now back to baseline creatinine 1.16.  Peaked at 2.54   Severe protein calorie malnutrition Cortrak removed 9/9.  Encouraging oral diet, dietitian following.  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  On Lasix  Mild hyperkalemia - Discontinue potassium supplements.  Closely monitor potassium levels.  Hypomagnesemia - PRN repletion   PT/OT-SNF   DVT prophylaxis: SCD   Code Status: Full Code  Family Communication: None   Status is: Inpatient Remains inpatient appropriate because: Recovering from surgery, awaiting placement in the meantime hoping good nutrition continues to improve           Subjective: Appetite slowly improving.   Examination:  General  exam: Appears calm and comfortable  Respiratory system: Clear to auscultation. Respiratory effort normal. Cardiovascular system: S1 & S2 heard, RRR. No JVD, murmurs, rubs, gallops or clicks. No pedal edema. Gastrointestinal system: Abdomen is nondistended, soft and nontender. No  organomegaly or masses felt. Normal bowel sounds heard. Central nervous system: Alert and oriented. No focal neurological deficits. Extremities: Bilateral BKA noted Skin: No rashes, lesions or ulcers Psychiatry: Judgement and insight appear normal. Mood & affect appropriate.   Pressure Injury 10/07/22 Elbow Posterior;Right Stage 2 -  Partial thickness loss of dermis presenting as a shallow open injury with a red, pink wound bed without slough. (Active)  10/07/22 1226  Location: Elbow  Location Orientation: Posterior;Right  Staging: Stage 2 -  Partial thickness loss of dermis presenting as a shallow open injury with a red, pink wound bed without slough.  Wound Description (Comments):   Present on Admission:      Diet Orders (From admission, onward)     Start     Ordered   10/26/22 1234  DIET SOFT Room service appropriate? Yes; Fluid consistency: Thin  Diet effective now       Question Answer Comment  Room service appropriate? Yes   Fluid consistency: Thin      10/26/22 1233            Objective: Vitals:   10/31/22 0500 10/31/22 0542 10/31/22 0542 10/31/22 0824  BP:   (!) 146/84 133/84  Pulse:   (!) 122 (!) 118  Resp:    18  Temp:  98.7 F (37.1 C)  99.3 F (37.4 C)  TempSrc:  Oral  Oral  SpO2:   100% 96%  Weight: 105.3 kg     Height:        Intake/Output Summary (Last 24 hours) at 10/31/2022 1253 Last data filed at 10/31/2022 0945 Gross per 24 hour  Intake 1210 ml  Output 0 ml  Net 1210 ml   Filed Weights   10/22/22 0500 10/24/22 0500 10/31/22 0500  Weight: 99 kg 99 kg 105.3 kg    Scheduled Meds:  sodium chloride   Intravenous Once   acetaminophen  1,000 mg Oral Q6H   vitamin C  1,000 mg Oral Daily   Chlorhexidine Gluconate Cloth  6 each Topical Daily   diphenhydrAMINE  25 mg Oral Q8H   docusate sodium  100 mg Oral BID   DULoxetine  30 mg Oral Daily   folic acid  1 mg Oral Daily   furosemide  40 mg Oral Daily   gabapentin  300 mg Oral Q8H   heparin  injection (subcutaneous)  5,000 Units Subcutaneous Q8H   insulin aspart  0-5 Units Subcutaneous QHS   insulin aspart  0-9 Units Subcutaneous TID WC   insulin glargine-yfgn  4 Units Subcutaneous Daily   methocarbamol  1,000 mg Oral TID   metoCLOPramide  10 mg Oral Q8H   multivitamin with minerals  1 tablet Oral Daily   nutrition supplement (JUVEN)  1 packet Oral BID BM   omeprazole  40 mg Oral Daily   mouth rinse  15 mL Mouth Rinse 4 times per day   silver nitrate applicators  1 Application Topical Once   sodium chloride flush  10-40 mL Intracatheter Q12H   zinc sulfate  220 mg Oral Daily   Continuous Infusions:  sodium chloride 10 mL/hr at 10/25/22 0517   magnesium sulfate bolus IVPB      Nutritional status Signs/Symptoms: estimated needs Interventions: Juven, Glucerna shake Body  mass index is 36.36 kg/m.  Data Reviewed:   CBC: Recent Labs  Lab 10/27/22 0415 10/28/22 0326 10/29/22 0325 10/30/22 0412 10/31/22 0304  WBC 33.8* 38.3* 32.7* 36.4* 39.6*  HGB 8.0* 7.3* 8.4* 8.6* 7.6*  HCT 24.6* 22.2* 25.7* 26.2* 23.8*  MCV 92.1 91.4 91.8 91.3 91.5  PLT 410* 428* 481* 547* 554*   Basic Metabolic Panel: Recent Labs  Lab 10/26/22 0310 10/28/22 0326 10/29/22 0325 10/30/22 0412 10/31/22 0304  NA 132* 133* 130* 131* 134*  K 4.7 4.4 4.9 5.0 5.4*  CL 107 106 102 103 101  CO2 19* 23 22 21* 22  GLUCOSE 120* 57* 239* 164* 243*  BUN 50* 35* 32* 26* 27*  CREATININE 1.48* 1.16* 1.17* 1.19* 1.34*  CALCIUM 7.3* 7.4* 7.1* 7.3* 7.4*  MG  --  1.5* 1.6* 1.9 1.6*  PHOS  --  4.2  --   --   --    GFR: Estimated Creatinine Clearance: 76 mL/min (A) (by C-G formula based on SCr of 1.34 mg/dL (H)). Liver Function Tests: No results for input(s): "AST", "ALT", "ALKPHOS", "BILITOT", "PROT", "ALBUMIN" in the last 168 hours. No results for input(s): "LIPASE", "AMYLASE" in the last 168 hours. No results for input(s): "AMMONIA" in the last 168 hours. Coagulation Profile: No results for  input(s): "INR", "PROTIME" in the last 168 hours. Cardiac Enzymes: No results for input(s): "CKTOTAL", "CKMB", "CKMBINDEX", "TROPONINI" in the last 168 hours. BNP (last 3 results) No results for input(s): "PROBNP" in the last 8760 hours. HbA1C: No results for input(s): "HGBA1C" in the last 72 hours. CBG: Recent Labs  Lab 10/30/22 1127 10/30/22 1522 10/30/22 2006 10/31/22 0741 10/31/22 1132  GLUCAP 212* 102* 125* 194* 189*   Lipid Profile: No results for input(s): "CHOL", "HDL", "LDLCALC", "TRIG", "CHOLHDL", "LDLDIRECT" in the last 72 hours. Thyroid Function Tests: No results for input(s): "TSH", "T4TOTAL", "FREET4", "T3FREE", "THYROIDAB" in the last 72 hours. Anemia Panel: No results for input(s): "VITAMINB12", "FOLATE", "FERRITIN", "TIBC", "IRON", "RETICCTPCT" in the last 72 hours. Sepsis Labs: No results for input(s): "PROCALCITON", "LATICACIDVEN" in the last 168 hours.  Recent Results (from the past 240 hour(s))  Surgical pcr screen     Status: None   Collection Time: 10/23/22  9:05 PM   Specimen: Nasal Mucosa; Nasal Swab  Result Value Ref Range Status   MRSA, PCR NEGATIVE NEGATIVE Final   Staphylococcus aureus NEGATIVE NEGATIVE Final    Comment: (NOTE) The Xpert SA Assay (FDA approved for NASAL specimens in patients 18 years of age and older), is one component of a comprehensive surveillance program. It is not intended to diagnose infection nor to guide or monitor treatment. Performed at [facility], [address], [place], [place] [address]   Aerobic/Anaerobic Culture w Gram Stain (surgical/deep wound)     Status: None   Collection Time: 10/24/22  2:43 PM   Specimen: Path fluid; Body Fluid  Result Value Ref Range Status   Specimen Description FLUID  Final   Special Requests right knee joint fluid  Final   Gram Stain   Final    RARE WBC PRESENT, PREDOMINANTLY PMN NO ORGANISMS SEEN    Culture   Final    No growth aerobically or anaerobically. Performed  at [facility], [address], [place], [place] [address]    Report Status 10/29/2022 FINAL  Final         Radiology Studies: No results found.         LOS: 28  days   Time spent= 35 mins    Miguel Rota, MD [facility]  If 7PM-7AM, please contact night-coverage  10/31/2022, 12:53 PM

## 2022-10-31 NOTE — Progress Notes (Signed)
Physical Therapy Treatment Patient Details Name: Kelli Hudson MRN: [id number] DOB: 1990/12/31 Today's Date: 10/31/2022   History of Present Illness Patient is 32 y.o. female presented to hospital 10/03/22 for bil foot wounds, fever, and pain for ~2 weeks with no improvement with antibiotics. Pt admitted for sepsis with necrotizing fasciitis/gangrene bilateral feet elected for surgical management. Pt underwent excision and debridement of BLE extending feet to thighs on 10/08/22 and remained intubated in ICU. 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 PMH significant for DM, HTN, gastric pacemaker.    PT Comments  Pt continues to make progress in strengthening UE and mastering sequencing of movement in bed, but does need near constant cuing to not dig end of residual limb into bed to push or pull herself.  Pt is mod I for moving in bed. Needs only minAx2 for use of pad to assist in transfers in and out of wheelchair. Once in wheelchair pt is very happy for some independence and enjoyed working on turns and propelling backwards. D/c plans remain appropriate. PT will continue to follow acutely.     If plan is discharge home, recommend the following: Two people to help with walking and/or transfers;Two people to help with bathing/dressing/bathroom;Assistance with cooking/housework;Assist for transportation;Help with stairs or ramp for entrance;Supervision due to cognitive status   Can travel by private vehicle     No  Equipment Recommendations  Wheelchair (measurements PT);Wheelchair cushion (measurements PT);Other (comment) (TBD at next venue)       Precautions / Restrictions Precautions Precautions: Fall Precaution Comments: bil wound vac Restrictions Weight Bearing Restrictions: Yes RLE Weight Bearing: Non weight bearing LLE Weight Bearing: Non weight bearing Other Position/Activity Restrictions: bil BKA     Mobility  Bed Mobility Overal bed mobility: Needs  Assistance Bed Mobility: Supine to Sit, Rolling Rolling: Modified independent (Device/Increase time)   Supine to sit: Contact guard, Used rails     General bed mobility comments: continues to move well for coming to seated    Transfers Overall transfer level: Needs assistance   Transfers: Bed to chair/wheelchair/BSC         Anterior-Posterior transfers: +2 physical assistance, +2 safety/equipment, From elevated surface, Min assist   General transfer comment: pt with min A x2 and use of bed pad to pivot in bed and for scooting backwards into wheelchair and forward back into bed from wheelchair, pt requires maximal cuing to resist the urge to dig residual limbs into bed surface to push back wards or pull forwards and instead to scoot hips back one at a time until she is strong enough to offweight hips with UE to slide backwards        Corporate treasurer Wheelchair mobility: Yes Wheelchair propulsion: Both upper extremities Wheelchair parts: Needs assistance Distance: 300 Wheelchair Assistance Details (indicate cue type and reason): pt prefers leg rests down and knees bent to sit in wheel chair training in backing up wheelchair and decreased radius turns       Balance Overall balance assessment: Needs assistance Sitting-balance support: Feet unsupported, Bilateral upper extremity supported Sitting balance-Leahy Scale: Fair Sitting balance - Comments: able to sit without outside support but can not accept challenge                                    Cognition Arousal: Alert Behavior During Therapy: Flat affect Overall Cognitive Status: Within Functional Limits for tasks  assessed                                          Exercises Amputee Exercises Knee Flexion: AROM, Both, 10 reps, Seated Knee Extension: AROM, Both, 10 reps, Seated    General Comments General comments (skin integrity, edema, etc.): pt with loss of  suction on R wound vac during transfer, increased wound drainage noted on R thigh with movement today, RN notified at end of session      Pertinent Vitals/Pain Pain Assessment Pain Assessment: Faces Faces Pain Scale: Hurts whole lot Pain Location: bil LE with movement Pain Descriptors / Indicators: Grimacing, Tender, Throbbing, Aching, Guarding Pain Intervention(s): Limited activity within patient's tolerance, Monitored during session, Repositioned     PT Goals (current goals can now be found in the care plan section) Acute Rehab PT Goals Patient Stated Goal: regain independence and ability to walk with prosthetics PT Goal Formulation: With patient Time For Goal Achievement: 11/08/22 Potential to Achieve Goals: Good Progress towards PT goals: Progressing toward goals    Frequency    Min 1X/week           Co-evaluation PT/OT/SLP Co-Evaluation/Treatment: Yes Reason for Co-Treatment: For patient/therapist safety PT goals addressed during session: Mobility/safety with mobility;Strengthening/ROM OT goals addressed during session: ADL's and self-care      AM-PAC PT "6 Clicks" Mobility   Outcome Measure  Help needed turning from your back to your side while in a flat bed without using bedrails?: A Lot Help needed moving from lying on your back to sitting on the side of a flat bed without using bedrails?: Total Help needed moving to and from a bed to a chair (including a wheelchair)?: Total Help needed standing up from a chair using your arms (e.g., wheelchair or bedside chair)?: Total Help needed to walk in hospital room?: Total Help needed climbing 3-5 steps with a railing? : Total 6 Click Score: 7    End of Session   Activity Tolerance: Patient limited by pain;Patient tolerated treatment well Patient left: in bed;with bed alarm set;with call bell/phone within reach Nurse Communication: Mobility status (2+) PT Visit Diagnosis: Muscle weakness (generalized)  (M62.81);Difficulty in walking, not elsewhere classified (R26.2);Other symptoms and signs involving the nervous system (R29.898);Other abnormalities of gait and mobility (R26.89);Unsteadiness on feet (R26.81);Pain Pain - Right/Left:  (bil) Pain - part of body: Leg     Time: 0932-3557 PT Time Calculation (min) (ACUTE ONLY): 75 min  Charges:    $Therapeutic Activity: 8-22 mins $Wheel Chair Management: 23-37 mins PT General Charges $$ ACUTE PT VISIT: 1 Visit                     Briscoe Daniello B. Beverely Risen PT, DPT Acute Rehabilitation Services Please use secure chat or  Call Office [phone]    Elon Alas [facility] 10/31/2022, 4:36 PM

## 2022-11-01 ENCOUNTER — Inpatient Hospital Stay (HOSPITAL_COMMUNITY): Payer: Medicaid Other

## 2022-11-01 DIAGNOSIS — M726 Necrotizing fasciitis: Secondary | ICD-10-CM | POA: Diagnosis not present

## 2022-11-01 LAB — CBC
HCT: 23.4 % — ABNORMAL LOW (ref 36.0–46.0)
Hemoglobin: 7.5 g/dL — ABNORMAL LOW (ref 12.0–15.0)
MCH: 29.9 pg (ref 26.0–34.0)
MCHC: 32.1 g/dL (ref 30.0–36.0)
MCV: 93.2 fL (ref 80.0–100.0)
Platelets: 561 10*3/uL — ABNORMAL HIGH (ref 150–400)
RBC: 2.51 MIL/uL — ABNORMAL LOW (ref 3.87–5.11)
RDW: 14.5 % (ref 11.5–15.5)
WBC: 44.6 10*3/uL — ABNORMAL HIGH (ref 4.0–10.5)
nRBC: 0 % (ref 0.0–0.2)

## 2022-11-01 LAB — BASIC METABOLIC PANEL
Anion gap: 9 (ref 5–15)
BUN: 31 mg/dL — ABNORMAL HIGH (ref 6–20)
CO2: 21 mmol/L — ABNORMAL LOW (ref 22–32)
Calcium: 7.3 mg/dL — ABNORMAL LOW (ref 8.9–10.3)
Chloride: 102 mmol/L (ref 98–111)
Creatinine, Ser: 1.31 mg/dL — ABNORMAL HIGH (ref 0.44–1.00)
GFR, Estimated: 56 mL/min — ABNORMAL LOW (ref >60)
Glucose, Bld: 121 mg/dL — ABNORMAL HIGH (ref 70–99)
Potassium: 5 mmol/L (ref 3.5–5.1)
Sodium: 132 mmol/L — ABNORMAL LOW (ref 135–145)

## 2022-11-01 LAB — GLUCOSE, CAPILLARY
Glucose-Capillary: 159 mg/dL — ABNORMAL HIGH (ref 70–99)
Glucose-Capillary: 199 mg/dL — ABNORMAL HIGH (ref 70–99)
Glucose-Capillary: 123 mg/dL — ABNORMAL HIGH (ref 70–99)
Glucose-Capillary: 83 mg/dL (ref 70–99)

## 2022-11-01 LAB — IRON AND TIBC: Iron: <5 ug/dL — ABNORMAL LOW (ref 28–170)

## 2022-11-01 LAB — VITAMIN B12: Vitamin B-12: 1317 pg/mL — ABNORMAL HIGH (ref 180–914)

## 2022-11-01 LAB — MAGNESIUM: Magnesium: 2 mg/dL (ref 1.7–2.4)

## 2022-11-01 LAB — FERRITIN: Ferritin: 722 ng/mL — ABNORMAL HIGH (ref 11–307)

## 2022-11-01 LAB — FOLATE: Folate: 15 ng/mL (ref >5.9)

## 2022-11-01 MED ORDER — HYDROMORPHONE HCL 1 MG/ML IJ SOLN
0.5000 mg | Freq: Four times a day (QID) | INTRAMUSCULAR | Status: DC | PRN
  Administered 2022-11-01 – 2022-11-04 (×12): 0.5 mg via INTRAVENOUS
  Filled 2022-11-01 (×12): qty 0.5

## 2022-11-01 MED ORDER — SODIUM CHLORIDE 0.9 % IV SOLN
200.0000 mg | Freq: Once | INTRAVENOUS | Status: AC
  Administered 2022-11-01: 200 mg via INTRAVENOUS
  Filled 2022-11-01: qty 10

## 2022-11-01 MED ORDER — CHLORHEXIDINE GLUCONATE CLOTH 2 % EX PADS
6.0000 | MEDICATED_PAD | Freq: Every day | CUTANEOUS | Status: DC
  Administered 2022-11-01 – 2022-11-13 (×13): 6 via TOPICAL

## 2022-11-01 MED ORDER — METOPROLOL TARTRATE 25 MG PO TABS
25.0000 mg | ORAL_TABLET | Freq: Two times a day (BID) | ORAL | Status: DC
  Administered 2022-11-01 – 2022-11-22 (×40): 25 mg via ORAL
  Filled 2022-11-01 (×41): qty 1

## 2022-11-01 NOTE — Progress Notes (Signed)
PROGRESS NOTE    Kelli Hudson  KGM:[id number] DOB: 03-03-1990 DOA: 10/03/2022 PCP: Norm Salt, PA    Brief Narrative:  32 y.o. female with medical history significant of DM1, diabetic gastroparesis with very frequent admission in the past, HTN presented w/ c/o B foot wounds, fever, and pain and informed that she has been dealing with lower extremity wound for past 2 weeks ,followed with her podiatrist in the outpatient setting at [facility] there which she recently finished a course of doxycycline which did not help her symptoms, initially had a blister that ruptured subsequently with redness streaking up her legs and noticing fever chills worsening pain in last few days.    Patient admitted for sepsis due to cellulitis/lower extremity wound, severe anemia, AKI. Podiatry consulted- Unable to complete MRI as gastric pacemaker is not MRI safe.  ID and Ortho were consulted.  Patient went to the OR for bilateral lower extremity necrotizing fasciitis feet to his thigh, remained intubated and required PRBC and FFP transfusion during the case on 8/21.  Eventually extubated on 8/23 and was taken back to the OR on 8/26 for wound VAC change.  Patient has remained on IV antibiotics.  Also underwent bilateral lower extremity BKA by Dr. Lajoyce Corners on 9/6.    Assessment & Plan:   Principal Problem:   Necrotizing fasciitis (HCC) Active Problems:   AKI (acute kidney injury) (HCC)   Sepsis due to cellulitis (HCC)   Cellulitis of lower extremity   Anemia   Diabetic gastroparesis associated with type 1 diabetes mellitus (HCC)   DM type 1 (diabetes mellitus, type 1) (HCC)   Drug-seeking behavior   Sepsis (HCC)   Necrotizing fasciitis of pelvic region and thigh (HCC)   Necrotizing fasciitis of lower leg (HCC)   MDD (major depressive disorder), recurrent episode, moderate (HCC)   Severe sepsis due to bilateral feet abscess, necrotizing fasciitis, POA.  MRSE, sensitive Pseudomonas S/p  bilateral transtibial amputation Initially underwent I&D on 8/21 with wound VAC change on 8/26 but ultimately required bilateral BKA by orthopedic on 9/6. Per ID, no further antibiotic needs, will reengage them.  Inflammatory markers and WBC still elevated.  I will also order bilateral lower extremity CT scan to rule out any deep progressing infection   Acute blood loss anemia due to operative blood loss Secondary to blood loss requiring multiplePRBC and FFP.  Hemoglobin again drifting down. Iron studies ordered.  IV iron ordered   Type 1 diabetes, poorly controlled with hyperglycemia and complicated with gastropathy Continue current sliding scale and Accu-Chek.  Adjust as necessary   Acute kidney injury on CKD stage IIIa due to septic ATN, improving Non-anion gap metabolic acidosis Hyponatremia, euvolemic Resolved now back to baseline creatinine 1.16.  Peaked at 2.54   Severe protein calorie malnutrition Cortrak removed 9/9.  Encouraging oral diet, dietitian following.  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  On Lasix  Mild hyperkalemia - Discontinue potassium supplements.  Closely monitor potassium levels.  Hypomagnesemia - PRN repletion   PT/OT-SNF   DVT prophylaxis: SCD   Code Status: Full Code  Family Communication: Mom updated.    Status is: Inpatient Remains inpatient appropriate because: Recovering from surgery, awaiting placement in the meantime hoping good nutrition continues to improve  Subjective: No complaints overall    Examination:  General exam: Appears calm and comfortable  Respiratory system: Clear to auscultation. Respiratory effort normal. Cardiovascular system: S1 & S2 heard, RRR. No JVD, murmurs, rubs, gallops or clicks. No pedal  edema. Gastrointestinal system: Abdomen is nondistended, soft and nontender. No organomegaly or masses felt. Normal bowel sounds heard. Central nervous system: Alert and oriented. No focal neurological deficits. Extremities: Bilateral lower extremity transtibial amputation with wound VAC noted.  Some bloody discharge in the canister especially on the right side. Skin: No rashes, lesions or ulcers Psychiatry: Judgement and insight appear normal. Mood & affect appropriate.   Pressure Injury 10/07/22 Elbow Posterior;Right Stage 2 -  Partial thickness loss of dermis presenting as a shallow open injury with a red, pink wound bed without slough. (Active)  10/07/22 1226  Location: Elbow  Location Orientation: Posterior;Right  Staging: Stage 2 -  Partial thickness loss of dermis presenting as a shallow open injury with a red, pink wound bed without slough.  Wound Description (Comments):   Present on Admission:      Diet Orders (From admission, onward)     Start     Ordered   10/26/22 1234  DIET SOFT Room service appropriate? Yes; Fluid consistency: Thin  Diet effective now       Question Answer Comment  Room service appropriate? Yes   Fluid consistency: Thin      10/26/22 1233            Objective: Vitals:   10/31/22 1944 10/31/22 2240 11/01/22 0506 11/01/22 0600  BP: 119/69  (!) 152/100   Pulse:  (!) 103 (!) 114   Resp: 16 16 18 18   Temp: 98.9 F (37.2 C)  98.2 F (36.8 C)   TempSrc: Oral  Oral   SpO2: 97% 97% 99%   Weight:      Height:        Intake/Output Summary (Last 24 hours) at 11/01/2022 1115 Last data filed at 11/01/2022 0200 Gross per 24 hour  Intake 831.11 ml  Output 0 ml  Net 831.11 ml   Filed Weights   10/22/22 0500 10/24/22 0500 10/31/22 0500  Weight: 99 kg 99 kg 105.3 kg    Scheduled Meds:  sodium chloride   Intravenous Once   acetaminophen  1,000 mg Oral Q6H   vitamin C  1,000 mg Oral Daily   Chlorhexidine Gluconate Cloth  6 each Topical Daily    Chlorhexidine Gluconate Cloth  6 each Topical Q0600   docusate sodium  100 mg Oral BID   DULoxetine  30 mg Oral Daily   folic acid  1 mg Oral Daily   furosemide  40 mg Oral Daily   gabapentin  300 mg Oral Q8H   heparin injection (subcutaneous)  5,000 Units Subcutaneous Q8H   insulin aspart  0-5 Units Subcutaneous QHS   insulin aspart  0-9 Units Subcutaneous TID WC   insulin glargine-yfgn  4 Units Subcutaneous Daily   methocarbamol  1,000 mg Oral TID   metoCLOPramide  10 mg Oral Q8H   metoprolol tartrate  25 mg Oral BID   multivitamin with minerals  1 tablet Oral Daily   nutrition supplement (JUVEN)  1 packet Oral BID BM   omeprazole  40 mg Oral Daily   mouth rinse  15 mL Mouth Rinse 4 times per day   silver nitrate applicators  1 Application Topical Once   sodium chloride flush  10-40 mL Intracatheter Q12H   zinc sulfate  220 mg Oral Daily   Continuous Infusions:  sodium chloride 10 mL/hr at 10/25/22 4696   iron sucrose     magnesium sulfate bolus IVPB      Nutritional status Signs/Symptoms: estimated needs Interventions: Juven, Glucerna shake Body mass index is 36.36 kg/m.  Data Reviewed:   CBC: Recent Labs  Lab 10/28/22 0326 10/29/22 0325 10/30/22 0412 10/31/22 0304 10/31/22 2354  WBC 38.3* 32.7* 36.4* 39.6* 44.6*  HGB 7.3* 8.4* 8.6* 7.6* 7.5*  HCT 22.2* 25.7* 26.2* 23.8* 23.4*  MCV 91.4 91.8 91.3 91.5 93.2  PLT 428* 481* 547* 554* 561*   Basic Metabolic Panel: Recent Labs  Lab 10/28/22 0326 10/29/22 0325 10/30/22 0412 10/31/22 0304 10/31/22 2354  NA 133* 130* 131* 134* 132*  K 4.4 4.9 5.0 5.4* 5.0  CL 106 102 103 101 102  CO2 23 22 21* 22 21*  GLUCOSE 57* 239* 164* 243* 121*  BUN 35* 32* 26* 27* 31*  CREATININE 1.16* 1.17* 1.19* 1.34* 1.31*  CALCIUM 7.4* 7.1* 7.3* 7.4* 7.3*  MG 1.5* 1.6* 1.9 1.6* 2.0  PHOS 4.2  --   --   --   --    GFR: Estimated Creatinine Clearance: 77.7 mL/min (A) (by C-G formula based on SCr of 1.31 mg/dL (H)). Liver  Function Tests: No results for input(s): "AST", "ALT", "ALKPHOS", "BILITOT", "PROT", "ALBUMIN" in the last 168 hours. No results for input(s): "LIPASE", "AMYLASE" in the last 168 hours. No results for input(s): "AMMONIA" in the last 168 hours. Coagulation Profile: No results for input(s): "INR", "PROTIME" in the last 168 hours. Cardiac Enzymes: No results for input(s): "CKTOTAL", "CKMB", "CKMBINDEX", "TROPONINI" in the last 168 hours. BNP (last 3 results) No results for input(s): "PROBNP" in the last 8760 hours. HbA1C: No results for input(s): "HGBA1C" in the last 72 hours. CBG: Recent Labs  Lab 10/31/22 0741 10/31/22 1132 10/31/22 1643 10/31/22 2107 11/01/22 0739  GLUCAP 194* 189* 81 80 159*   Lipid Profile: No results for input(s): "CHOL", "HDL", "LDLCALC", "TRIG", "CHOLHDL", "LDLDIRECT" in the last 72 hours. Thyroid Function Tests: No results for input(s): "TSH", "T4TOTAL", "FREET4", "T3FREE", "THYROIDAB" in the last 72 hours. Anemia Panel: Recent Labs    10/31/22 0304 10/31/22 2354  VITAMINB12 1,317*  --   FOLATE  --  15.0  FERRITIN 722*  --   TIBC NOT CALCULATED  --   IRON <5*  --    Sepsis Labs: No results for input(s): "PROCALCITON", "LATICACIDVEN" in the last 168 hours.  Recent Results (from the past 240 hour(s))  Surgical pcr screen     Status: None   Collection Time: 10/23/22  9:05 PM   Specimen: Nasal Mucosa; Nasal Swab  Result Value Ref Range Status   MRSA, PCR NEGATIVE NEGATIVE Final   Staphylococcus aureus NEGATIVE NEGATIVE Final    Comment: (NOTE) The Xpert SA Assay (FDA approved for NASAL specimens in patients 82 years of age and older), is one component of a comprehensive surveillance program. It is not intended to diagnose infection nor to guide or monitor treatment. Performed at [facility], [address] [place], [place] [address]   Aerobic/Anaerobic Culture w Gram Stain (surgical/deep wound)     Status: None   Collection Time:  10/24/22  2:43 PM   Specimen: Path fluid; Body Fluid  Result Value Ref Range Status   Specimen Description FLUID  Final   Special Requests right knee joint fluid  Final   Gram Stain   Final  RARE WBC PRESENT, PREDOMINANTLY PMN NO ORGANISMS SEEN    Culture   Final    No growth aerobically or anaerobically. Performed at [facility], [address], [place], [place] [address]    Report Status 10/29/2022 FINAL  Final         Radiology Studies: No results found.         LOS: 29 days   Time spent= 35 mins    Miguel Rota, MD [facility]  If 7PM-7AM, please contact night-coverage  11/01/2022, 11:15 AM

## 2022-11-01 NOTE — Plan of Care (Signed)

## 2022-11-02 DIAGNOSIS — M726 Necrotizing fasciitis: Secondary | ICD-10-CM | POA: Diagnosis not present

## 2022-11-02 LAB — CBC
HCT: 22 % — ABNORMAL LOW (ref 36.0–46.0)
Hemoglobin: 7 g/dL — ABNORMAL LOW (ref 12.0–15.0)
MCH: 29.5 pg (ref 26.0–34.0)
MCHC: 31.8 g/dL (ref 30.0–36.0)
MCV: 92.8 fL (ref 80.0–100.0)
Platelets: 576 10*3/uL — ABNORMAL HIGH (ref 150–400)
RBC: 2.37 MIL/uL — ABNORMAL LOW (ref 3.87–5.11)
RDW: 14.5 % (ref 11.5–15.5)
WBC: 34.8 10*3/uL — ABNORMAL HIGH (ref 4.0–10.5)
nRBC: 0 % (ref 0.0–0.2)

## 2022-11-02 LAB — BASIC METABOLIC PANEL
Anion gap: 8 (ref 5–15)
BUN: 39 mg/dL — ABNORMAL HIGH (ref 6–20)
CO2: 21 mmol/L — ABNORMAL LOW (ref 22–32)
Calcium: 7.5 mg/dL — ABNORMAL LOW (ref 8.9–10.3)
Chloride: 102 mmol/L (ref 98–111)
Creatinine, Ser: 1.3 mg/dL — ABNORMAL HIGH (ref 0.44–1.00)
GFR, Estimated: 56 mL/min — ABNORMAL LOW (ref >60)
Glucose, Bld: 103 mg/dL — ABNORMAL HIGH (ref 70–99)
Potassium: 4.8 mmol/L (ref 3.5–5.1)
Sodium: 131 mmol/L — ABNORMAL LOW (ref 135–145)

## 2022-11-02 LAB — GLUCOSE, CAPILLARY
Glucose-Capillary: 103 mg/dL — ABNORMAL HIGH (ref 70–99)
Glucose-Capillary: 177 mg/dL — ABNORMAL HIGH (ref 70–99)
Glucose-Capillary: 196 mg/dL — ABNORMAL HIGH (ref 70–99)
Glucose-Capillary: 138 mg/dL — ABNORMAL HIGH (ref 70–99)

## 2022-11-02 LAB — MAGNESIUM: Magnesium: 1.8 mg/dL (ref 1.7–2.4)

## 2022-11-02 LAB — PREPARE RBC (CROSSMATCH)

## 2022-11-02 MED ORDER — SODIUM CHLORIDE 0.9% IV SOLUTION: Freq: Once | INTRAVENOUS | Status: AC

## 2022-11-02 MED ORDER — HYDROCORTISONE 1 % EX CREA
TOPICAL_CREAM | Freq: Three times a day (TID) | CUTANEOUS | Status: DC
  Filled 2022-11-02 (×3): qty 28

## 2022-11-02 NOTE — Progress Notes (Signed)
PROGRESS NOTE    Kelli Hudson  ZOX:[id number] DOB: 1990-05-23 DOA: 10/03/2022 PCP: Norm Salt, PA    Brief Narrative:  32 y.o. female with medical history significant of DM1, diabetic gastroparesis with very frequent admission in the past, HTN presented w/ c/o B foot wounds, fever, and pain and informed that she has been dealing with lower extremity wound for past 2 weeks ,followed with her podiatrist in the outpatient setting at [facility] there which she recently finished a course of doxycycline which did not help her symptoms, initially had a blister that ruptured subsequently with redness streaking up her legs and noticing fever chills worsening pain in last few days.    Patient admitted for sepsis due to cellulitis/lower extremity wound, severe anemia, AKI. Podiatry consulted- Unable to complete MRI as gastric pacemaker is not MRI safe.  ID and Ortho were consulted.  Patient went to the OR for bilateral lower extremity necrotizing fasciitis feet to his thigh, remained intubated and required PRBC and FFP transfusion during the case on 8/21.  Eventually extubated on 8/23 and was taken back to the OR on 8/26 for wound VAC change.  Patient has remained on IV antibiotics.  Also underwent bilateral lower extremity BKA by Dr. Lajoyce Corners on 9/6.    Assessment & Plan:   Principal Problem:   Necrotizing fasciitis (HCC) Active Problems:   AKI (acute kidney injury) (HCC)   Sepsis due to cellulitis (HCC)   Cellulitis of lower extremity   Anemia   Diabetic gastroparesis associated with type 1 diabetes mellitus (HCC)   DM type 1 (diabetes mellitus, type 1) (HCC)   Drug-seeking behavior   Sepsis (HCC)   Necrotizing fasciitis of pelvic region and thigh (HCC)   Necrotizing fasciitis of lower leg (HCC)   MDD (major depressive disorder), recurrent episode, moderate (HCC)   Severe sepsis due to bilateral feet abscess, necrotizing fasciitis, POA.  MRSE, sensitive Pseudomonas S/p  bilateral transtibial amputation Initially underwent I&D on 8/21 with wound VAC change on 8/26 but ultimately required bilateral BKA by orthopedic on 9/6.  CT of bilateral lower extremity showing postsurgical changes with some edema, gaseous changes and some hematoma?.  Discussed CT findings with Dr. Magnus Ivan from orthopedic who will further discuss this with Dr. Lajoyce Corners, no acute surgical indication at this time.  ID recommending holding off on antibiotics   Acute blood loss anemia due to operative blood loss Secondary to blood loss requiring multiplePRBC and FFP.  Hemoglobin continues to drift down.  Combination of wound VAC drainage, blood draws and some hematoma at surgical site.  Given IV iron, will transfuse again today.   Type 1 diabetes, poorly controlled with hyperglycemia and complicated with gastropathy Continue current sliding scale and Accu-Chek.  Adjust as necessary   Acute kidney injury on CKD stage IIIa due to septic ATN, improving Non-anion gap metabolic acidosis Hyponatremia, euvolemic Resolved now back to baseline creatinine 1.16.  Peaked at 2.54   Severe protein calorie malnutrition Cortrak removed 9/9.  Encouraging oral diet, dietitian following.  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  On Lasix  Mild hyperkalemia - Discontinue potassium supplements.  Closely monitor potassium levels.  Hypomagnesemia - PRN repletion   PT/OT-SNF   DVT prophylaxis: SCD   Code Status: Full Code  Family Communication: Mom updated periodically  Status is: Inpatient Remains inpatient appropriate because: Recovering from surgery, awaiting placement in the meantime hoping good nutrition continues to improve           Subjective: Doing ok no complaints.    Examination:  General exam: Appears  calm and comfortable  Respiratory system: Clear to auscultation. Respiratory effort normal. Cardiovascular system: S1 & S2 heard, RRR. No JVD, murmurs, rubs, gallops or clicks. No pedal edema. Gastrointestinal system: Abdomen is nondistended, soft and nontender. No organomegaly or masses felt. Normal bowel sounds heard. Central nervous system: Alert and oriented. No focal neurological deficits. Extremities:b/l bka noted w/ wound vac in place.  Skin: No rashes, lesions or ulcers Psychiatry: Judgement and insight appear normal. Mood & affect appropriate.   Pressure Injury 10/07/22 Elbow Posterior;Right Stage 2 -  Partial thickness loss of dermis presenting as a shallow open injury with a red, pink wound bed without slough. (Active)  10/07/22 1226  Location: Elbow  Location Orientation: Posterior;Right  Staging: Stage 2 -  Partial thickness loss of dermis presenting as a shallow open injury with a red, pink wound bed without slough.  Wound Description (Comments):   Present on Admission:      Diet Orders (From admission, onward)     Start     Ordered   10/26/22 1234  DIET SOFT Room service appropriate? Yes; Fluid consistency: Thin  Diet effective now       Question Answer Comment  Room service appropriate? Yes   Fluid consistency: Thin      10/26/22 1233            Objective: Vitals:   11/01/22 1212 11/01/22 1945 11/02/22 0632 11/02/22 0808  BP: 105/66 133/80 121/64 118/67  Pulse: 96  (!) 101 (!) 106  Resp: 16 16 16 18   Temp: 98.8 F (37.1 C) 97.8 F (36.6 C) 98.1 F (36.7 C) 98.8 F (37.1 C)  TempSrc: Oral Axillary Oral Oral  SpO2: 97% 100% 98% (!) 88%  Weight:      Height:        Intake/Output Summary (Last 24 hours) at 11/02/2022 1102 Last data filed at 11/01/2022 2000 Gross per 24 hour  Intake 110.12 ml  Output 0 ml  Net 110.12 ml   Filed Weights   10/22/22 0500 10/24/22 0500 10/31/22 0500  Weight: 99 kg 99 kg 105.3 kg    Scheduled Meds:  sodium  chloride   Intravenous Once   sodium chloride   Intravenous Once   acetaminophen  1,000 mg Oral Q6H   vitamin C  1,000 mg Oral Daily   Chlorhexidine Gluconate Cloth  6 each Topical Daily   Chlorhexidine Gluconate Cloth  6 each Topical Q0600   docusate sodium  100 mg Oral BID   DULoxetine  30 mg Oral Daily   folic acid  1 mg Oral Daily   furosemide  40 mg Oral Daily   gabapentin  300 mg Oral Q8H   heparin injection (subcutaneous)  5,000 Units Subcutaneous Q8H   insulin aspart  0-5 Units Subcutaneous QHS   insulin aspart  0-9 Units Subcutaneous TID WC   insulin glargine-yfgn  4 Units Subcutaneous Daily   methocarbamol  1,000 mg Oral TID   metoCLOPramide  10 mg Oral Q8H   metoprolol tartrate  25 mg Oral BID   multivitamin with minerals  1 tablet Oral Daily   nutrition supplement (JUVEN)  1 packet Oral BID BM   omeprazole  40 mg Oral Daily   mouth rinse  15 mL Mouth Rinse 4 times per day   silver nitrate applicators  1 Application Topical Once   sodium chloride flush  10-40 mL Intracatheter Q12H   zinc sulfate  220 mg Oral Daily   Continuous Infusions:  sodium chloride 10 mL/hr at 11/01/22 2000   magnesium sulfate bolus IVPB      Nutritional status Signs/Symptoms: estimated needs Interventions: Juven, Glucerna shake Body mass index is 36.36 kg/m.  Data Reviewed:   CBC: Recent Labs  Lab 10/29/22 0325 10/30/22 0412 10/31/22 0304 10/31/22 2354 11/02/22 0413  WBC 32.7* 36.4* 39.6* 44.6* 34.8*  HGB 8.4* 8.6* 7.6* 7.5* 7.0*  HCT 25.7* 26.2* 23.8* 23.4* 22.0*  MCV 91.8 91.3 91.5 93.2 92.8  PLT 481* 547* 554* 561* 576*   Basic Metabolic Panel: Recent Labs  Lab 10/28/22 0326 10/29/22 0325 10/30/22 0412 10/31/22 0304 10/31/22 2354 11/02/22 0413  NA 133* 130* 131* 134* 132* 131*  K 4.4 4.9 5.0 5.4* 5.0 4.8  CL 106 102 103 101 102 102  CO2 23 22 21* 22 21* 21*  GLUCOSE 57* 239* 164* 243* 121* 103*  BUN 35* 32* 26* 27* 31* 39*  CREATININE 1.16* 1.17* 1.19* 1.34*  1.31* 1.30*  CALCIUM 7.4* 7.1* 7.3* 7.4* 7.3* 7.5*  MG 1.5* 1.6* 1.9 1.6* 2.0 1.8  PHOS 4.2  --   --   --   --   --    GFR: Estimated Creatinine Clearance: 78.3 mL/min (A) (by C-G formula based on SCr of 1.3 mg/dL (H)). Liver Function Tests: No results for input(s): "AST", "ALT", "ALKPHOS", "BILITOT", "PROT", "ALBUMIN" in the last 168 hours. No results for input(s): "LIPASE", "AMYLASE" in the last 168 hours. No results for input(s): "AMMONIA" in the last 168 hours. Coagulation Profile: No results for input(s): "INR", "PROTIME" in the last 168 hours. Cardiac Enzymes: No results for input(s): "CKTOTAL", "CKMB", "CKMBINDEX", "TROPONINI" in the last 168 hours. BNP (last 3 results) No results for input(s): "PROBNP" in the last 8760 hours. HbA1C: No results for input(s): "HGBA1C" in the last 72 hours. CBG: Recent Labs  Lab 11/01/22 0739 11/01/22 1117 11/01/22 1608 11/01/22 1940 11/02/22 0810  GLUCAP 159* 199* 123* 83 103*   Lipid Profile: No results for input(s): "CHOL", "HDL", "LDLCALC", "TRIG", "CHOLHDL", "LDLDIRECT" in the last 72 hours. Thyroid Function Tests: No results for input(s): "TSH", "T4TOTAL", "FREET4", "T3FREE", "THYROIDAB" in the last 72 hours. Anemia Panel: Recent Labs    10/31/22 0304 10/31/22 2354  VITAMINB12 1,317*  --   FOLATE  --  15.0  FERRITIN 722*  --   TIBC NOT CALCULATED  --   IRON <5*  --    Sepsis Labs: No results for input(s): "PROCALCITON", "LATICACIDVEN" in the last 168 hours.  Recent Results (from the past 240 hour(s))  Surgical pcr screen     Status: None   Collection Time: 10/23/22  9:05 PM   Specimen: Nasal Mucosa; Nasal Swab  Result Value Ref Range Status   MRSA, PCR NEGATIVE NEGATIVE Final   Staphylococcus aureus NEGATIVE NEGATIVE Final    Comment: (NOTE) The Xpert SA Assay (FDA approved for NASAL specimens in patients 63 years of age and older), is one component of a comprehensive surveillance program. It is not intended to  diagnose infection nor to guide or monitor treatment. Performed at [facility], [address], [place], [place] [address]   Aerobic/Anaerobic Culture w Gram Stain (surgical/deep wound)     Status: None   Collection Time: 10/24/22  2:43  PM   Specimen: Path fluid; Body Fluid  Result Value Ref Range Status   Specimen Description FLUID  Final   Special Requests right knee joint fluid  Final   Gram Stain   Final    RARE WBC PRESENT, PREDOMINANTLY PMN NO ORGANISMS SEEN    Culture   Final    No growth aerobically or anaerobically. Performed at [facility], [address], [place], [place] [address]    Report Status 10/29/2022 FINAL  Final         Radiology Studies: CT FEMUR RIGHT WO CONTRAST  Result Date: 11/01/2022 CLINICAL DATA:  Soft tissue infection, below the knee amputation 8 days ago EXAM: CT OF THE RIGHT THIGH WITHOUT CONTRAST TECHNIQUE: Multidetector CT imaging of the right lower extremity was performed according to the standard protocol. RADIATION DOSE REDUCTION: This exam was performed according to the departmental dose-optimization program which includes automated exposure control, adjustment of the mA and/or kV according to patient size and/or use of iterative reconstruction technique. COMPARISON:  None Available. FINDINGS: Bones/Joint/Cartilage No bony destructive findings. No hip joint effusion. Small knee joint effusion. Ligaments Suboptimally assessed by CT. Muscles and Tendons As noted in the CT tibia/fibular report, there is a somewhat hyperdense collection tracking along the left side sartorius muscle, left medial knee, and left posterolateral calf, probably a hematoma, but with some areas of linear extension to the cutaneous surface for example on image 277 of series 4, which could represent draining process. There is a tiny loculation of gas in this process just below the knee joint. Cannot exclude superimposed infection of hematoma. Overall volume of this  process is about 450 cc when combining the thigh and calf components. Generalized soft tissue edema in the thigh includes low-level edema tracking along fascia planes. Soft tissues Diffuse subcutaneous edema in the right thigh. There is also some localized subcutaneous edema laterally in the mid thigh tracking towards the cutaneous surface for example on image 182 of series 4, likely simply from confluent edema along the superficial fascia margin. This does not appear hyperdense and is less likely to be a hematoma. Atheromatous vascular calcifications noted. IMPRESSION: 1. Hyperdense collection tracking along the left side of the sartorius muscle, left medial knee, and left posterolateral calf, probably a hematoma, but with some areas of linear extension to the cutaneous surface for example which could represent draining process. There is a tiny loculation of gas in this process just below the knee joint. Cannot exclude superimposed infection of hematoma. Overall volume of this process is about 450 cc when combining the thigh and calf components, as reported on the tibia/fibula CT. 2. Generalized soft tissue edema in the right thigh including low-level edema tracking along fascia planes. 3. Small knee joint effusion. 4. Atheromatous vascular calcifications. Electronically Signed   By: Gaylyn Rong M.D.   On: 11/01/2022 13:59   CT FEMUR LEFT WO CONTRAST  Result Date: 11/01/2022 CLINICAL DATA:  Recent below the knee amputation, concern for soft tissue infection EXAM: CT OF THE LOWER LEFT THIGH WITHOUT CONTRAST TECHNIQUE: Multidetector CT imaging of the lower left extremity was performed according to the standard protocol. RADIATION DOSE REDUCTION: This exam was performed according to the departmental dose-optimization program which includes automated exposure control, adjustment of the mA and/or kV according to patient size and/or use of iterative reconstruction technique. COMPARISON:  Left tibia/fibula CT  of 11/01/2022 FINDINGS: Bones/Joint/Cartilage No bony destructive findings along the femur to indicate osteomyelitis. As noted on the prior  tibia/fibula CT, there is a knee effusion with a trace amount of gas internally, probably related to previous aspiration of the left knee given the lack of associated synovitis. Ligaments Suboptimally assessed by CT. Muscles and Tendons There is likely some low-grade edema tracking along fascia planes in the thigh, although not disproportionate to the subcutaneous edema. Apparent expansion of the distal vastus medialis with a small amount of density extending to the cutaneous surface for example on image 267 of series 2, possibly reflecting local hematoma along the superficial fascia margin, or less likely a small draining infection. I do not see significant gas in the soft tissues nor is they a obvious abscess. Hypodensity suggesting fluid or hematoma tracks between the distal vastus medialis and sartorius muscles on image 305 of series 2, and along the medial knee. Soft tissues Diffuse subcutaneous edema noted along the pelvis, perineum, and left thigh. IMPRESSION: 1. No specific findings of osteomyelitis. 2. Expansion of the distal vastus medialis with a small amount of density extending to the cutaneous surface, possibly reflecting local hematoma along the superficial fascia margin, or less likely a small draining infection. I do not see significant gas in the soft tissues nor is there a obvious abscess. 3. Hypodensity suggesting fluid or hematoma tracks between the distal vastus medialis and sartorius muscles, and along the medial knee. 4. Diffuse subcutaneous edema along the pelvis, perineum, and left thigh. 5. Knee effusion with a trace amount of gas internally, probably related to previous aspiration of the left knee given the lack of associated synovitis. Electronically Signed   By: Gaylyn Rong M.D.   On: 11/01/2022 13:53   CT TIBIA FIBULA RIGHT WO  CONTRAST  Result Date: 11/01/2022 CLINICAL DATA:  Bilateral below-the-knee amputations 8 days ago, soft tissue swelling/mass EXAM: CT OF THE LOWER RIGHT EXTREMITY WITHOUT CONTRAST TECHNIQUE: Multidetector CT imaging of the right lower extremity was performed according to the standard protocol. RADIATION DOSE REDUCTION: This exam was performed according to the departmental dose-optimization program which includes automated exposure control, adjustment of the mA and/or kV according to patient size and/or use of iterative reconstruction technique. COMPARISON:  None Available. FINDINGS: Bones/Joint/Cartilage Right below-the-knee amputation noted about 13.8 cm distal to the tibial plateau. No current findings of osteomyelitis or unexpected bony abnormality. Trace knee effusion. Ligaments Suboptimally assessed by CT. Muscles and Tendons Tracking especially along the distal sartorius muscle and posterolateral to the knee and along the calf, we demonstrate a 7.2 by 3.6 by 33.2 cm (volume = 450 cm^3) mildly hyperdense collection favoring hematoma. In order to arrive at the vertical length, I include some of the CT scan of the thigh. This collection has some components extending to the cutaneous surface especially in the thigh region, and just below the level of the knee joint has some small gas loculations internally as on image 71 series 2. Strictly speaking, infection of this collection is not excluded and drainage to the cutaneous surface is not excluded. However, the hyperdensity favors blood products. This tracks around to the bottom of the stump medially. The hematoma also tracks along the superficial margin of the medial head gastrocnemius in the calf. Indistinctness of fat stranding in the anterior and posterior compartment of the calf suggesting edema. Soft tissues Cutaneous thickening and mild subcutaneous edema are present especially distally along the stump, not unexpected in the postoperative setting.  IMPRESSION: 1. 450 cubic cm mildly hyperdense collection tracking along the superficial fascia margin of the sartorius muscle in the thigh and calf,  and posterolateral to the knee and along the calf, favoring hematoma. This collection has some components extending to the cutaneous surface especially in the thigh region, and just below the level of the knee joint has some small gas loculations internally. Strictly speaking, infection of this collection is not excluded and drainage to the cutaneous surface is not excluded. However, the hyperdensity favors blood products. 2. Indistinctness of fat stranding in the anterior and posterior compartment of the calf suggesting edema. 3. Trace knee effusion. Electronically Signed   By: Gaylyn Rong M.D.   On: 11/01/2022 13:46   CT TIBIA FIBULA LEFT WO CONTRAST  Result Date: 11/01/2022 CLINICAL DATA:  Bilateral below the knee amputation 10/24/2022. Soft tissue mass/swelling. EXAM: CT OF THE LOWER LEFT TIBIA/FIBULA WITHOUT CONTRAST TECHNIQUE: Multidetector CT imaging of the lower left extremity was performed according to the standard protocol. Imaging was performed from the knee through the end of the stump. RADIATION DOSE REDUCTION: This exam was performed according to the departmental dose-optimization program which includes automated exposure control, adjustment of the mA and/or kV according to patient size and/or use of iterative reconstruction technique. COMPARISON:  None Available. FINDINGS: Bones/Joint/Cartilage Below the knee amputation observed with the tibia and fibula amputated about 12.8 cm below the tibial plateau. No current bony destructive findings to indicate osteomyelitis. Small knee effusion noted, miniscule loculation of gas in the knee effusion on image 38 series 2. Although septic joint is not completely excluded, there does not seem to be a lot of synovitis along the knee effusion. If the patient has had recent knee arthrocentesis than this tiny  loculation of gas could be iatrogenic. Ligaments Suboptimally assessed by CT. Muscles and Tendons Low-grade edema tracks along muscle groups in the anterior and posterior compartments of the calf, especially distally near the end of the stump. Trace loculations of gas in the anterior and posterior compartment on image 153 of series 2, this is near the into the stump and could conceivably represent residual postoperative gas 8 days out from surgery. Infection is a differential diagnostic consideration but the appearance does not clearly represent abscess. Soft tissues Wound VAC noted anteriorly along the distal stump margin with skin staples noted transversely along the anterior stump margin. Cutaneous thickening is present circumferentially but more notably anteriorly likewise circumferential subcutaneous edema is present although with some posterior sparing. IMPRESSION: 1. Below the knee amputation with wound VAC along the anterior stump margin. 2. Low-grade edema tracks along muscle groups in the anterior and posterior compartments of the calf, especially distally near the end of the stump. Trace loculations of gas in the anterior and posterior compartment near the end of the stump could conceivably represent residual postoperative gas 8 days out from surgery. Infection is a differential diagnostic consideration but the appearance does not clearly represent abscess. 3. Small knee effusion with miniscule loculation of gas in the knee effusion. Although septic joint is not completely excluded, there does not seem to be a lot of synovitis along the knee effusion. If the patient has had recent knee arthrocentesis than this tiny loculation of gas could be iatrogenic. Otherwise, consider knee arthrocentesis to exclude early septic joint. 4. Circumferential cutaneous thickening and subcutaneous edema, with some posterior sparing. Electronically Signed   By: Gaylyn Rong M.D.   On: 11/01/2022 13:37            LOS: 30 days   Time spent= 35 mins    Geneva Pallas Zoila Shutter, MD [facility]  If 7PM-7AM, please  contact night-coverage  11/02/2022, 11:02 AM

## 2022-11-02 NOTE — Plan of Care (Signed)

## 2022-11-02 NOTE — Progress Notes (Signed)
    [facility]   Reason for visit: Follow up on leukocytosis  Interval History: CT scan of leg bilaterally done yesterday  Physical Exam: Constitutional:  Vitals:   11/02/22 0632 11/02/22 0808  BP: 121/64 118/67  Pulse: (!) 101 (!) 106  Resp: 16 18  Temp: 98.1 F (36.7 C) 98.8 F (37.1 C)  SpO2: 98% (!) 88%   patient appears in NAD  Leukocytosis and CT with signs of hematoma.   No fever, no signs of infection.  No indication for antibiotics.   Hematoma management per orthopedics.

## 2022-11-03 DIAGNOSIS — M726 Necrotizing fasciitis: Secondary | ICD-10-CM | POA: Diagnosis not present

## 2022-11-03 LAB — BASIC METABOLIC PANEL
Anion gap: 7 (ref 5–15)
BUN: 41 mg/dL — ABNORMAL HIGH (ref 6–20)
CO2: 21 mmol/L — ABNORMAL LOW (ref 22–32)
Calcium: 7.4 mg/dL — ABNORMAL LOW (ref 8.9–10.3)
Chloride: 103 mmol/L (ref 98–111)
Creatinine, Ser: 1.26 mg/dL — ABNORMAL HIGH (ref 0.44–1.00)
GFR, Estimated: 59 mL/min — ABNORMAL LOW (ref >60)
Glucose, Bld: 118 mg/dL — ABNORMAL HIGH (ref 70–99)
Potassium: 4.5 mmol/L (ref 3.5–5.1)
Sodium: 131 mmol/L — ABNORMAL LOW (ref 135–145)

## 2022-11-03 LAB — MAGNESIUM: Magnesium: 1.8 mg/dL (ref 1.7–2.4)

## 2022-11-03 LAB — CBC
HCT: 25.3 % — ABNORMAL LOW (ref 36.0–46.0)
Hemoglobin: 8 g/dL — ABNORMAL LOW (ref 12.0–15.0)
MCH: 29.2 pg (ref 26.0–34.0)
MCHC: 31.6 g/dL (ref 30.0–36.0)
MCV: 92.3 fL (ref 80.0–100.0)
Platelets: 572 10*3/uL — ABNORMAL HIGH (ref 150–400)
RBC: 2.74 MIL/uL — ABNORMAL LOW (ref 3.87–5.11)
RDW: 14.7 % (ref 11.5–15.5)
WBC: 33.3 10*3/uL — ABNORMAL HIGH (ref 4.0–10.5)
nRBC: 0 % (ref 0.0–0.2)

## 2022-11-03 LAB — TYPE AND SCREEN
ABO/RH(D): O POS
Antibody Screen: NEGATIVE
Unit division: 0

## 2022-11-03 LAB — GLUCOSE, CAPILLARY
Glucose-Capillary: 117 mg/dL — ABNORMAL HIGH (ref 70–99)
Glucose-Capillary: 172 mg/dL — ABNORMAL HIGH (ref 70–99)
Glucose-Capillary: 126 mg/dL — ABNORMAL HIGH (ref 70–99)
Glucose-Capillary: 81 mg/dL (ref 70–99)

## 2022-11-03 LAB — BPAM RBC
Blood Product Expiration Date: 202410152359
ISSUE DATE / TIME: 202409151310
Unit Type and Rh: 5100

## 2022-11-03 NOTE — Progress Notes (Signed)
Physical Therapy Treatment Patient Details Name: Kelli Hudson MRN: [id number] DOB: [birth year] Today's Date: 11/03/2022   History of Present Illness Patient is 32 y.o. female presented to hospital 10/03/22 for bil foot wounds, fever, and pain for ~2 weeks with no improvement with antibiotics. Pt admitted for sepsis with necrotizing fasciitis/gangrene bilateral feet elected for surgical management. Pt underwent excision and debridement of BLE extending feet to thighs on 10/08/22 and remained intubated in ICU. 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 PMH significant for DM, HTN, gastric pacemaker.    PT Comments  Pt obviously upset and painful on entry as RN was performing dressing change. Pt noting how swollen and painful her thighs are. Given situation, focused session on pain control using relaxation and modified yoga technique of legs up the wall. With min Ax2 to assist with pad for pivoting in bed able to turn pt to facing the HoB and had pt lay back down, utilizing elevation of HoB to lift LE simulating legs up the wall. Once in position, provided verbal cues for relaxation and deep belly breathing. After about 5 min inquired if pt was comfortable and would like to be left in that position while PT saw another pt. Pt in agreement and was left in position for 30 min. On return pt reports feeling better and LE were less taught. Assisted pt with pivoting back, raising HoB and placing pillow under R hip to offload sacral wound. D/c plans remain appropriate at this time.     If plan is discharge home, recommend the following: Two people to help with walking and/or transfers;Two people to help with bathing/dressing/bathroom;Assistance with cooking/housework;Assist for transportation;Help with stairs or ramp for entrance;Supervision due to cognitive status   Can travel by private vehicle     No  Equipment Recommendations  Wheelchair (measurements PT);Wheelchair cushion  (measurements PT);Other (comment) (TBD at next venue)       Precautions / Restrictions Precautions Precautions: Fall Precaution Comments: bil wound vac Restrictions Weight Bearing Restrictions: Yes RLE Weight Bearing: Non weight bearing LLE Weight Bearing: Non weight bearing Other Position/Activity Restrictions: bil BKA     Mobility  Bed Mobility Overal bed mobility: Needs Assistance Bed Mobility: Supine to Sit, Sit to Supine     Supine to sit: Contact guard, Used rails, HOB elevated Sit to supine: Contact guard assist, Used rails, HOB elevated   General bed mobility comments: pt able to come to longsitting from supine with HoB elevated, and return to supine with HoB elevated utilizing core strength    Transfers Overall transfer level: Needs assistance             Anterior-Posterior transfers: Min assist, +2 physical assistance   General transfer comment: pt able to pivot in bed with minAx2 for scooting with bed pad to face HoB, and then to pivot again to face foot of bed             Balance Overall balance assessment: Needs assistance Sitting-balance support: Feet unsupported, Bilateral upper extremity supported Sitting balance-Leahy Scale: Fair Sitting balance - Comments: able to sit without outside support but can not accept challenge                                    Cognition Arousal: Alert Behavior During Therapy: Flat affect Overall Cognitive Status: Within Functional Limits for tasks assessed  Exercises Other Exercises Other Exercises: mindful belly breathing 5 min    General Comments General comments (skin integrity, edema, etc.): had BM and was just cleaned up experiencing increased pain and swelling in residual limbs      Pertinent Vitals/Pain Pain Assessment Pain Assessment: Faces Faces Pain Scale: Hurts whole lot Pain Location: sacrum, bil LE with movement Pain  Descriptors / Indicators: Grimacing, Tender, Throbbing, Aching, Guarding Pain Intervention(s): Limited activity within patient's tolerance, Monitored during session, Repositioned, Relaxation, Utilized relaxation techniques, Premedicated before session     PT Goals (current goals can now be found in the care plan section) Acute Rehab PT Goals Patient Stated Goal: regain independence and ability to walk with prosthetics PT Goal Formulation: With patient Time For Goal Achievement: 11/08/22 Potential to Achieve Goals: Good Progress towards PT goals: Not progressing toward goals - comment    Frequency    Min 1X/week       AM-PAC PT "6 Clicks" Mobility   Outcome Measure  Help needed turning from your back to your side while in a flat bed without using bedrails?: A Lot Help needed moving from lying on your back to sitting on the side of a flat bed without using bedrails?: Total Help needed moving to and from a bed to a chair (including a wheelchair)?: Total Help needed standing up from a chair using your arms (e.g., wheelchair or bedside chair)?: Total Help needed to walk in hospital room?: Total Help needed climbing 3-5 steps with a railing? : Total 6 Click Score: 7    End of Session   Activity Tolerance: Patient limited by pain;Patient tolerated treatment well Patient left: in bed;with bed alarm set;with call bell/phone within reach Nurse Communication: Mobility status (2+) PT Visit Diagnosis: Muscle weakness (generalized) (M62.81);Difficulty in walking, not elsewhere classified (R26.2);Other symptoms and signs involving the nervous system (R29.898);Other abnormalities of gait and mobility (R26.89);Unsteadiness on feet (R26.81);Pain Pain - Right/Left:  (bil) Pain - part of body: Leg     Time: 9562-1308 and 6578-4696 PT Time Calculation (min) (ACUTE ONLY): 25 min  Charges:    $Therapeutic Activity: 8-22 mins PT General Charges $$ ACUTE PT VISIT: 1 Visit                      Makya Phillis B. Beverely Risen PT, DPT Acute Rehabilitation Services Please use secure chat or  Call Office [phone]    Elon Alas [facility] 11/03/2022, 4:28 PM

## 2022-11-03 NOTE — TOC Progression Note (Addendum)
Transition of Care [facility]) - Progression Note    Patient Details  Name: Kelli Hudson MRN: [id number] Date of Birth: 02-Sep-1990  Transition [facility] CM/SW Contact  Lorri Frederick, LCSW Phone Number: 11/03/2022, 9:23 AM  Clinical Narrative:   PASSR received: [id number] E  1100: Message from Whitney/[place]: Auth has been approved through 9/29.  MD informed.    Expected Discharge Plan: Skilled Nursing Facility Barriers to Discharge: Continued Medical Work up, SNF Pending bed offer  Expected Discharge Plan and Services In-house Referral: Clinical Social Work   Post Acute Care Choice: Skilled Nursing Facility Living arrangements for the past 2 months: Apartment                                       Social Determinants of Health (SDOH) Interventions SDOH Screenings   Food Insecurity: No Food Insecurity (10/03/2022)  Housing: Low Risk  (10/03/2022)  Transportation Needs: No Transportation Needs (10/03/2022)  Utilities: Not At Risk (10/03/2022)  Alcohol Screen: Low Risk  (05/29/2021)  Depression (PHQ2-9): Low Risk  (05/29/2021)  Recent Concern: Depression (PHQ2-9) - Medium Risk (04/10/2021)  Financial Resource Strain: Low Risk  (02/13/2022)   Received from [facility]  Social Connections: Unknown (06/17/2021)   Received from [facility]  Stress: No Stress Concern Present (09/16/2022)   Received from [facility]  Tobacco Use: Low Risk  (10/24/2022)    Readmission Risk Interventions    05/08/2022    9:45 AM 03/24/2022    3:33 PM 12/12/2021    8:59 AM  Readmission Risk Prevention Plan  Transportation Screening Complete Complete Complete  Medication Review Oceanographer) Complete Complete Complete  PCP or Specialist appointment within 3-5 days of discharge Complete Complete Complete  HRI or Home Care Consult Complete Complete Complete  SW Recovery Care/Counseling Consult Complete Complete Complete  Palliative Care Screening  Not Applicable Not Applicable Not Applicable  Skilled Nursing Facility Not Applicable Not Applicable Not Applicable

## 2022-11-03 NOTE — Progress Notes (Signed)
PROGRESS NOTE    Kelli Hudson  LOV:[id number] DOB: 1991-02-03 DOA: 10/03/2022 PCP: Norm Salt, PA    Brief Narrative:  32 y.o. female with medical history significant of DM1, diabetic gastroparesis with very frequent admission in the past, HTN presented w/ c/o B foot wounds, fever, and pain and informed that she has been dealing with lower extremity wound for past 2 weeks ,followed with her podiatrist in the outpatient setting at [facility] there which she recently finished a course of doxycycline which did not help her symptoms, initially had a blister that ruptured subsequently with redness streaking up her legs and noticing fever chills worsening pain in last few days.    Patient admitted for sepsis due to cellulitis/lower extremity wound, severe anemia, AKI. Podiatry consulted- Unable to complete MRI as gastric pacemaker is not MRI safe.  ID and Ortho were consulted.  Patient went to the OR for bilateral lower extremity necrotizing fasciitis feet to his thigh, remained intubated and required PRBC and FFP transfusion during the case on 8/21.  Eventually extubated on 8/23 and was taken back to the OR on 8/26 for wound VAC change.  Patient has remained on IV antibiotics.  Also underwent bilateral lower extremity BKA by Dr. Lajoyce Corners on 9/6.    Assessment & Plan:   Principal Problem:   Necrotizing fasciitis (HCC) Active Problems:   AKI (acute kidney injury) (HCC)   Sepsis due to cellulitis (HCC)   Cellulitis of lower extremity   Anemia   Diabetic gastroparesis associated with type 1 diabetes mellitus (HCC)   DM type 1 (diabetes mellitus, type 1) (HCC)   Drug-seeking behavior   Sepsis (HCC)   Necrotizing fasciitis of pelvic region and thigh (HCC)   Necrotizing fasciitis of lower leg (HCC)   MDD (major depressive disorder), recurrent episode, moderate (HCC)   Severe sepsis due to bilateral feet abscess, necrotizing fasciitis, POA.  MRSE, sensitive Pseudomonas S/p  bilateral transtibial amputation Initially underwent I&D on 8/21 with wound VAC change on 8/26 but ultimately required bilateral BKA by orthopedic on 9/6.  CT of bilateral lower extremity showing postsurgical changes with some edema, gaseous changes and some hematoma?.  Holding off on antibiotics per ID, discussed with Dr. Lajoyce Corners will see the patient tomorrow   Acute blood loss anemia due to operative blood loss Received multiple units of PRBC, IV iron.   Type 1 diabetes, poorly controlled with hyperglycemia and complicated with gastropathy Continue current sliding scale and Accu-Chek.  Adjust as necessary   Acute kidney injury on CKD stage IIIa due to septic ATN, improving Non-anion gap metabolic acidosis Hyponatremia, euvolemic Resolved now back to baseline creatinine 1.2.  Peaked at 2.54   Severe protein calorie malnutrition Cortrak removed 9/9.  Encouraging oral diet, dietitian following.  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  On Lasix  Mild hyperkalemia - Stable  Hypomagnesemia - PRN repletion   PT/OT-SNF   DVT prophylaxis: SCD   Code Status: Full Code  Family Communication: Mom updated periodically   Status is: Inpatient Remains inpatient appropriate because: Closer monitoring of LE wounds. Ortho to address it. Hoping for SNF in a day or two.            Subjective:  Seen at bedside, no complaints.  Overall feels better.  Mom is present at  bedside as well.  Examination:  General exam: Appears calm and comfortable  Respiratory system: Clear to auscultation. Respiratory effort normal. Cardiovascular system: S1 & S2 heard, RRR. No JVD, murmurs, rubs, gallops or clicks. No pedal edema. Gastrointestinal system: Abdomen is nondistended, soft and nontender. No organomegaly or masses  felt. Normal bowel sounds heard. Central nervous system: Alert and oriented. No focal neurological deficits. Extremities: Bilateral BKA with wound vacs noted Skin: No rashes, lesions or ulcers Psychiatry: Judgement and insight appear normal. Mood & affect appropriate.   Pressure Injury 10/07/22 Elbow Posterior;Right Stage 2 -  Partial thickness loss of dermis presenting as a shallow open injury with a red, pink wound bed without slough. (Active)  10/07/22 1226  Location: Elbow  Location Orientation: Posterior;Right  Staging: Stage 2 -  Partial thickness loss of dermis presenting as a shallow open injury with a red, pink wound bed without slough.  Wound Description (Comments):   Present on Admission:      Diet Orders (From admission, onward)     Start     Ordered   10/26/22 1234  DIET SOFT Room service appropriate? Yes; Fluid consistency: Thin  Diet effective now       Question Answer Comment  Room service appropriate? Yes   Fluid consistency: Thin      10/26/22 1233            Objective: Vitals:   11/02/22 2138 11/03/22 0332 11/03/22 0500 11/03/22 0738  BP: 124/75 (!) 151/91  126/71  Pulse: 96 (!) 106  90  Resp: 15 14 14 18   Temp: 98.9 F (37.2 C) 98.7 F (37.1 C)  98.7 F (37.1 C)  TempSrc:  Oral  Oral  SpO2: 100% 99%  100%  Weight:  104.2 kg    Height:        Intake/Output Summary (Last 24 hours) at 11/03/2022 1233 Last data filed at 11/03/2022 1610 Gross per 24 hour  Intake 662 ml  Output 70 ml  Net 592 ml   Filed Weights   10/24/22 0500 10/31/22 0500 11/03/22 0332  Weight: 99 kg 105.3 kg 104.2 kg    Scheduled Meds:  sodium chloride   Intravenous Once   acetaminophen  1,000 mg Oral Q6H   vitamin C  1,000 mg Oral Daily   Chlorhexidine Gluconate Cloth  6 each Topical Daily   Chlorhexidine Gluconate Cloth  6 each Topical Q0600   docusate sodium  100 mg Oral BID   DULoxetine  30 mg Oral Daily   folic acid  1 mg Oral Daily   furosemide  40 mg Oral  Daily   gabapentin  300 mg Oral Q8H   heparin injection (subcutaneous)  5,000 Units Subcutaneous Q8H   hydrocortisone cream   Topical TID   insulin aspart  0-5 Units Subcutaneous QHS   insulin aspart  0-9 Units Subcutaneous TID WC   insulin glargine-yfgn  4 Units Subcutaneous Daily   methocarbamol  1,000 mg Oral TID   metoCLOPramide  10 mg Oral Q8H   metoprolol tartrate  25 mg Oral BID   multivitamin with minerals  1 tablet Oral Daily   nutrition supplement (JUVEN)  1 packet Oral BID BM   omeprazole  40 mg Oral Daily   mouth rinse  15 mL Mouth Rinse 4 times per day   silver nitrate applicators  1 Application Topical Once   sodium chloride flush  10-40 mL Intracatheter Q12H   zinc sulfate  220 mg Oral Daily  Continuous Infusions:  sodium chloride 10 mL/hr at 11/01/22 2000   magnesium sulfate bolus IVPB      Nutritional status Signs/Symptoms: estimated needs Interventions: Juven, Glucerna shake Body mass index is 35.98 kg/m.  Data Reviewed:   CBC: Recent Labs  Lab 10/30/22 0412 10/31/22 0304 10/31/22 2354 11/02/22 0413 11/03/22 0329  WBC 36.4* 39.6* 44.6* 34.8* 33.3*  HGB 8.6* 7.6* 7.5* 7.0* 8.0*  HCT 26.2* 23.8* 23.4* 22.0* 25.3*  MCV 91.3 91.5 93.2 92.8 92.3  PLT 547* 554* 561* 576* 572*   Basic Metabolic Panel: Recent Labs  Lab 10/28/22 0326 10/29/22 0325 10/30/22 0412 10/31/22 0304 10/31/22 2354 11/02/22 0413 11/03/22 0329  NA 133*   < > 131* 134* 132* 131* 131*  K 4.4   < > 5.0 5.4* 5.0 4.8 4.5  CL 106   < > 103 101 102 102 103  CO2 23   < > 21* 22 21* 21* 21*  GLUCOSE 57*   < > 164* 243* 121* 103* 118*  BUN 35*   < > 26* 27* 31* 39* 41*  CREATININE 1.16*   < > 1.19* 1.34* 1.31* 1.30* 1.26*  CALCIUM 7.4*   < > 7.3* 7.4* 7.3* 7.5* 7.4*  MG 1.5*   < > 1.9 1.6* 2.0 1.8 1.8  PHOS 4.2  --   --   --   --   --   --    < > = values in this interval not displayed.   GFR: Estimated Creatinine Clearance: 80.3 mL/min (A) (by C-G formula based on SCr of 1.26  mg/dL (H)). Liver Function Tests: No results for input(s): "AST", "ALT", "ALKPHOS", "BILITOT", "PROT", "ALBUMIN" in the last 168 hours. No results for input(s): "LIPASE", "AMYLASE" in the last 168 hours. No results for input(s): "AMMONIA" in the last 168 hours. Coagulation Profile: No results for input(s): "INR", "PROTIME" in the last 168 hours. Cardiac Enzymes: No results for input(s): "CKTOTAL", "CKMB", "CKMBINDEX", "TROPONINI" in the last 168 hours. BNP (last 3 results) No results for input(s): "PROBNP" in the last 8760 hours. HbA1C: No results for input(s): "HGBA1C" in the last 72 hours. CBG: Recent Labs  Lab 11/02/22 1125 11/02/22 1602 11/02/22 2127 11/03/22 0736 11/03/22 1113  GLUCAP 177* 196* 138* 117* 172*   Lipid Profile: No results for input(s): "CHOL", "HDL", "LDLCALC", "TRIG", "CHOLHDL", "LDLDIRECT" in the last 72 hours. Thyroid Function Tests: No results for input(s): "TSH", "T4TOTAL", "FREET4", "T3FREE", "THYROIDAB" in the last 72 hours. Anemia Panel: Recent Labs    10/31/22 2354  FOLATE 15.0   Sepsis Labs: No results for input(s): "PROCALCITON", "LATICACIDVEN" in the last 168 hours.  Recent Results (from the past 240 hour(s))  Aerobic/Anaerobic Culture w Gram Stain (surgical/deep wound)     Status: None   Collection Time: 10/24/22  2:43 PM   Specimen: Path fluid; Body Fluid  Result Value Ref Range Status   Specimen Description FLUID  Final   Special Requests right knee joint fluid  Final   Gram Stain   Final    RARE WBC PRESENT, PREDOMINANTLY PMN NO ORGANISMS SEEN    Culture   Final    No growth aerobically or anaerobically. Performed at [facility], [address], [place], [place] [address]    Report Status 10/29/2022 FINAL  Final         Radiology Studies: No results found.         LOS: 31 days   Time spent= 35 mins    Andreka Stucki  Zoila Shutter, MD [facility]  If 7PM-7AM, please contact night-coverage  11/03/2022, 12:33  PM

## 2022-11-03 NOTE — Plan of Care (Signed)
Pt had two Bms overnight this far into the shift. Pain managed with PRN pain medication. Pt still requiring IV prn pain for breakthrough pain of 9-10/10 in her knee.  Refusing turns but encouraged to prevent pressure injury.     Problem: Education: Goal: Knowledge of General Education information will improve Description: Including pain rating scale, medication(s)/side effects and non-pharmacologic comfort measures Outcome: Progressing   Problem: Health Behavior/Discharge Planning: Goal: Ability to manage health-related needs will improve Outcome: Progressing   Problem: Clinical Measurements: Goal: Ability to maintain clinical measurements within normal limits will improve Outcome: Progressing Goal: Will remain free from infection Outcome: Progressing Goal: Diagnostic test results will improve Outcome: Progressing Goal: Respiratory complications will improve Outcome: Progressing

## 2022-11-04 DIAGNOSIS — M726 Necrotizing fasciitis: Secondary | ICD-10-CM | POA: Diagnosis not present

## 2022-11-04 LAB — CBC
HCT: 27 % — ABNORMAL LOW (ref 36.0–46.0)
Hemoglobin: 8.5 g/dL — ABNORMAL LOW (ref 12.0–15.0)
MCH: 29.5 pg (ref 26.0–34.0)
MCHC: 31.5 g/dL (ref 30.0–36.0)
MCV: 93.8 fL (ref 80.0–100.0)
Platelets: 643 10*3/uL — ABNORMAL HIGH (ref 150–400)
RBC: 2.88 MIL/uL — ABNORMAL LOW (ref 3.87–5.11)
RDW: 14.6 % (ref 11.5–15.5)
WBC: 40.6 10*3/uL — ABNORMAL HIGH (ref 4.0–10.5)
nRBC: 0 % (ref 0.0–0.2)

## 2022-11-04 LAB — GLUCOSE, CAPILLARY
Glucose-Capillary: 65 mg/dL — ABNORMAL LOW (ref 70–99)
Glucose-Capillary: 132 mg/dL — ABNORMAL HIGH (ref 70–99)
Glucose-Capillary: 158 mg/dL — ABNORMAL HIGH (ref 70–99)
Glucose-Capillary: 123 mg/dL — ABNORMAL HIGH (ref 70–99)

## 2022-11-04 LAB — BASIC METABOLIC PANEL
Anion gap: 7 (ref 5–15)
BUN: 33 mg/dL — ABNORMAL HIGH (ref 6–20)
CO2: 20 mmol/L — ABNORMAL LOW (ref 22–32)
Calcium: 7.3 mg/dL — ABNORMAL LOW (ref 8.9–10.3)
Chloride: 104 mmol/L (ref 98–111)
Creatinine, Ser: 1.13 mg/dL — ABNORMAL HIGH (ref 0.44–1.00)
GFR, Estimated: >60 mL/min (ref >60)
Glucose, Bld: 118 mg/dL — ABNORMAL HIGH (ref 70–99)
Potassium: 4.6 mmol/L (ref 3.5–5.1)
Sodium: 131 mmol/L — ABNORMAL LOW (ref 135–145)

## 2022-11-04 LAB — MAGNESIUM: Magnesium: 1.6 mg/dL — ABNORMAL LOW (ref 1.7–2.4)

## 2022-11-04 MED ORDER — CHLORHEXIDINE GLUCONATE 4 % EX SOLN: 60.0000 mL | Freq: Once | CUTANEOUS | Status: DC

## 2022-11-04 MED ORDER — CEFAZOLIN SODIUM-DEXTROSE 2-4 GM/100ML-% IV SOLN
2.0000 g | INTRAVENOUS | Status: AC
  Administered 2022-11-05: 2 g via INTRAVENOUS
  Filled 2022-11-04: qty 100

## 2022-11-04 MED ORDER — POVIDONE-IODINE 10 % EX SWAB
2.0000 | Freq: Once | CUTANEOUS | Status: AC
  Administered 2022-11-05: 2 via TOPICAL

## 2022-11-04 MED ORDER — HYDROMORPHONE HCL 1 MG/ML IJ SOLN
0.5000 mg | Freq: Four times a day (QID) | INTRAMUSCULAR | Status: DC | PRN
  Administered 2022-11-04: 0.5 mg via INTRAVENOUS
  Filled 2022-11-04: qty 0.5

## 2022-11-04 MED ORDER — HYDROMORPHONE HCL 1 MG/ML IJ SOLN
1.0000 mg | INTRAMUSCULAR | Status: DC | PRN
  Administered 2022-11-04 – 2022-11-06 (×10): 1 mg via INTRAVENOUS
  Filled 2022-11-04 (×10): qty 1

## 2022-11-04 NOTE — Progress Notes (Signed)
Patient ID: Kelli Hudson, female   DOB: 1990/10/02, 32 y.o.   MRN: [id number] Patient has persistent elevated white cell count and now is having increased pain in both thighs.  There is no induration in both thighs that were previously soft and supple.  Will plan for debridement of both thighs tomorrow.  I have discussed this with her mother as well.  White cell count elevated at 33,000.

## 2022-11-04 NOTE — Progress Notes (Signed)
Physical Therapy Treatment Patient Details Name: Kelli Hudson MRN: [id number] DOB: 13-Jan-1991 Today's Date: 11/04/2022   History of Present Illness Patient is 32 y.o. female presented to hospital 10/03/22 for bil foot wounds, fever, and pain for ~2 weeks with no improvement with antibiotics. Pt admitted for sepsis with necrotizing fasciitis/gangrene bilateral feet elected for surgical management. Pt underwent excision and debridement of BLE extending feet to thighs on 10/08/22 and remained intubated in ICU. 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 PMH significant for DM, HTN, gastric pacemaker.    PT Comments  Pt with increased swelling and pain in bilateral thighs today. RN redressing oozing incisional sites on entry. Pt reports plan for return to OR tomorrow for I&D. Despite pain pt eager to get into wheelchair to be able to get out of room for awhile. In order to reduce pressure on thighs PT and tech provided increased assistance with A-P transfer to and from wheelchair. Pt report decrease in pressure with LE in dependent position sitting in wheelchair and is able to independently propel wheelchair in hallway. PT to reevaluate pt after surgery.     If plan is discharge home, recommend the following: Two people to help with walking and/or transfers;Two people to help with bathing/dressing/bathroom;Assistance with cooking/housework;Assist for transportation;Help with stairs or ramp for entrance;Supervision due to cognitive status   Can travel by private vehicle     No  Equipment Recommendations  Wheelchair (measurements PT);Wheelchair cushion (measurements PT);Other (comment) (TBD at next venue)       Precautions / Restrictions Precautions Precautions: Fall Precaution Comments: bil wound vac Restrictions Weight Bearing Restrictions: Yes RLE Weight Bearing: Non weight bearing LLE Weight Bearing: Non weight bearing Other Position/Activity Restrictions: bil BKA      Mobility  Bed Mobility               General bed mobility comments: pt longsitting in bed on entry with RN redressing wounds, pt scooted back to bed with pad and HoB raised to support her back    Transfers Overall transfer level: Needs assistance   Transfers: Bed to chair/wheelchair/BSC         Anterior-Posterior transfers: +2 physical assistance, +2 safety/equipment, From elevated surface, Total assist, Max assist   General transfer comment: PT and Tech provided increased assist due to painful LE, pt able to utilize UE to help reciprocal scooting of hips back to wheelchair, total A back to bed due to increased pain after return from wheelchair training      Corporate treasurer Wheelchair mobility: Yes Wheelchair propulsion: Both upper extremities Wheelchair parts: Needs assistance Distance: 100 Wheelchair Assistance Details (indicate cue type and reason): pt reports increased relief in LE with them in a dependent positon due to increased pressure in her thighs. pt enjoyed being out of the room and being able to independently propel the wheelchair,         Balance Overall balance assessment: Needs assistance Sitting-balance support: Feet unsupported, Bilateral upper extremity supported Sitting balance-Leahy Scale: Fair                                      Cognition Arousal: Alert Behavior During Therapy: Flat affect Overall Cognitive Status: Within Functional Limits for tasks assessed  General Comments General comments (skin integrity, edema, etc.): pt's thighs are both very tight with swelling, and are oozing from incisional sites, pt to return to OR tomorrow for I&D      Pertinent Vitals/Pain Pain Assessment Pain Assessment: Faces Faces Pain Scale: Hurts whole lot Pain Location: bil LE swelling Pain Descriptors / Indicators: Grimacing, Tender, Throbbing,  Aching, Guarding Pain Intervention(s): Limited activity within patient's tolerance, Monitored during session, Repositioned     PT Goals (current goals can now be found in the care plan section) Acute Rehab PT Goals Patient Stated Goal: regain independence and ability to walk with prosthetics PT Goal Formulation: With patient Time For Goal Achievement: 11/08/22 Potential to Achieve Goals: Good Progress towards PT goals: Progressing toward goals    Frequency    Min 1X/week       AM-PAC PT "6 Clicks" Mobility   Outcome Measure  Help needed turning from your back to your side while in a flat bed without using bedrails?: A Lot Help needed moving from lying on your back to sitting on the side of a flat bed without using bedrails?: Total Help needed moving to and from a bed to a chair (including a wheelchair)?: Total Help needed standing up from a chair using your arms (e.g., wheelchair or bedside chair)?: Total Help needed to walk in hospital room?: Total Help needed climbing 3-5 steps with a railing? : Total 6 Click Score: 7    End of Session   Activity Tolerance: Patient limited by pain;Patient tolerated treatment well Patient left: in bed;with bed alarm set;with call bell/phone within reach Nurse Communication: Mobility status;Patient requests pain meds (2+) PT Visit Diagnosis: Muscle weakness (generalized) (M62.81);Difficulty in walking, not elsewhere classified (R26.2);Other symptoms and signs involving the nervous system (R29.898);Other abnormalities of gait and mobility (R26.89);Unsteadiness on feet (R26.81);Pain Pain - Right/Left:  (bil) Pain - part of body: Leg     Time: 8295-6213 PT Time Calculation (min) (ACUTE ONLY): 45 min  Charges:    $Therapeutic Activity: 8-22 mins $Wheel Chair Management: 23-37 mins PT General Charges $$ ACUTE PT VISIT: 1 Visit                     Bhavya Eschete B. Beverely Risen PT, DPT Acute Rehabilitation Services Please use secure chat or   Call Office [phone]    Elon Alas [facility] 11/04/2022, 4:18 PM

## 2022-11-04 NOTE — Progress Notes (Signed)
PROGRESS NOTE    Kelli Hudson  WUJ:[id number] DOB: 04-04-1990 DOA: 10/03/2022 PCP: Norm Salt, PA    Brief Narrative:  32 y.o. female with medical history significant of DM1, diabetic gastroparesis with very frequent admission in the past, HTN presented w/ c/o B foot wounds, fever, and pain and informed that she has been dealing with lower extremity wound for past 2 weeks ,followed with her podiatrist in the outpatient setting at [facility] there which she recently finished a course of doxycycline which did not help her symptoms, initially had a blister that ruptured subsequently with redness streaking up her legs and noticing fever chills worsening pain in last few days.  Patient admitted for sepsis due to cellulitis/lower extremity wound, severe anemia, AKI. Podiatry consulted- Unable to complete MRI as gastric pacemaker is not MRI safe.  ID and Ortho were consulted.  Patient went to the OR for bilateral lower extremity necrotizing fasciitis feet to his thigh, remained intubated and required PRBC and FFP transfusion during the case on 8/21.  Eventually extubated on 8/23 and was taken back to the OR on 8/26 for wound VAC change.  Patient has remained on IV antibiotics.  Also underwent bilateral lower extremity BKA by Dr. Lajoyce Corners on 9/6. Since her surgery intermittently having anemia without any obvious signs of blood loss requiring PRBC transfusion, WBC still remains elevated but stable, no antibiotics per ID.   Assessment & Plan:   Principal Problem:   Necrotizing fasciitis (HCC) Active Problems:   AKI (acute kidney injury) (HCC)   Sepsis due to cellulitis (HCC)   Cellulitis of lower extremity   Anemia   Diabetic gastroparesis associated with type 1 diabetes mellitus (HCC)   DM type 1 (diabetes mellitus, type 1) (HCC)   Drug-seeking behavior   Sepsis (HCC)   Necrotizing fasciitis of pelvic region and thigh (HCC)   Necrotizing fasciitis of lower leg (HCC)   MDD  (major depressive disorder), recurrent episode, moderate (HCC)   Severe sepsis due to bilateral feet abscess, necrotizing fasciitis, POA.  MRSE, sensitive Pseudomonas S/p bilateral transtibial amputation Initially underwent I&D on 8/21 with wound VAC change on 8/26 but ultimately required bilateral BKA by orthopedic on 9/6.  CT of bilateral lower extremity showing postsurgical changes with some edema, gaseous changes and some hematoma?.  Holding off on antibiotics per ID, discussed with Dr. Lajoyce Corners will see the patient today   Acute blood loss anemia due to operative blood loss Received multiple units of PRBC, IV iron.   Type 1 diabetes, poorly controlled with intermittent hypoglycemia Continue current sliding scale and Accu-Chek.  Adjust as necessary   Acute kidney injury on CKD stage IIIa due to septic ATN, improving Non-anion gap metabolic acidosis Hyponatremia, euvolemic Resolved now back to baseline creatinine 1.2.  Peaked at 2.54   Severe protein calorie malnutrition Cortrak removed 9/9.  Encouraging oral diet, dietitian following.  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  On Lasix  Mild hyperkalemia - Stable  Hypomagnesemia - PRN repletion   PT/OT-SNF Prior to discharge remove right upper extremity PICC line if no longer further requires it.   DVT prophylaxis: SCD   Code Status: Full Code  Family Communication: Mom up to date   Status is: Inpatient Remains inpatient appropriate because: Discharge to SNF once cleared by Ortho  Subjective: Patient in pain during my evaluation due to lower extremities.  No other complaints   Examination:  General exam: Appears calm and comfortable  Respiratory system: Clear to auscultation. Respiratory effort normal. Cardiovascular  system: S1 & S2 heard, RRR. No JVD, murmurs, rubs, gallops or clicks. No pedal edema. Gastrointestinal system: Abdomen is nondistended, soft and nontender. No organomegaly or masses felt. Normal bowel sounds heard. Central nervous system: Alert and oriented. No focal neurological deficits. Extremities: Bilateral BKA noted Skin: Wound VAC in place Psychiatry: Judgement and insight appear normal. Mood & affect appropriate.   Pressure Injury 10/07/22 Elbow Posterior;Right Stage 2 -  Partial thickness loss of dermis presenting as a shallow open injury with a red, pink wound bed without slough. (Active)  10/07/22 1226  Location: Elbow  Location Orientation: Posterior;Right  Staging: Stage 2 -  Partial thickness loss of dermis presenting as a shallow open injury with a red, pink wound bed without slough.  Wound Description (Comments):   Present on Admission:      Diet Orders (From admission, onward)     Start     Ordered   10/26/22 1234  DIET SOFT Room service appropriate? Yes; Fluid consistency: Thin  Diet effective now       Question Answer Comment  Room service appropriate? Yes   Fluid consistency: Thin      10/26/22 1233            Objective: Vitals:   11/03/22 2300 11/04/22 0400 11/04/22 0604 11/04/22 0718  BP:   127/75 130/81  Pulse:   95 96  Resp: 18 16 16    Temp:   98.7 F (37.1 C) 99 F (37.2 C)  TempSrc:   Oral Oral  SpO2:   100% 98%  Weight:      Height:        Intake/Output Summary (Last 24 hours) at 11/04/2022 1222 Last data filed at 11/04/2022 0647 Gross per 24 hour  Intake 600 ml  Output 0 ml  Net 600 ml   Filed Weights   10/24/22 0500 10/31/22 0500 11/03/22 0332  Weight: 99 kg 105.3 kg 104.2 kg    Scheduled Meds:  sodium chloride   Intravenous Once   acetaminophen  1,000 mg Oral Q6H   vitamin C  1,000 mg Oral Daily   Chlorhexidine Gluconate Cloth  6 each Topical Daily   Chlorhexidine Gluconate Cloth  6 each Topical Q0600   docusate sodium   100 mg Oral BID   DULoxetine  30 mg Oral Daily   folic acid  1 mg Oral Daily   furosemide  40 mg Oral Daily   gabapentin  300 mg Oral Q8H   heparin injection (subcutaneous)  5,000 Units Subcutaneous Q8H   hydrocortisone cream   Topical TID   insulin aspart  0-5 Units Subcutaneous QHS   insulin aspart  0-9 Units Subcutaneous TID WC   methocarbamol  1,000 mg Oral TID   metoCLOPramide  10 mg Oral Q8H   metoprolol tartrate  25 mg Oral BID   multivitamin with minerals  1 tablet Oral Daily   nutrition supplement (JUVEN)  1 packet Oral BID BM   omeprazole  40 mg Oral Daily   mouth rinse  15 mL Mouth Rinse 4 times per day   silver nitrate applicators  1 Application Topical Once   sodium chloride flush  10-40 mL Intracatheter Q12H   zinc sulfate  220 mg Oral Daily   Continuous Infusions:  sodium chloride 10  mL/hr at 11/01/22 2000   magnesium sulfate bolus IVPB      Nutritional status Signs/Symptoms: estimated needs Interventions: Juven, Glucerna shake Body mass index is 35.98 kg/m.  Data Reviewed:   CBC: Recent Labs  Lab 10/31/22 0304 10/31/22 2354 11/02/22 0413 11/03/22 0329 11/04/22 1021  WBC 39.6* 44.6* 34.8* 33.3* 40.6*  HGB 7.6* 7.5* 7.0* 8.0* 8.5*  HCT 23.8* 23.4* 22.0* 25.3* 27.0*  MCV 91.5 93.2 92.8 92.3 93.8  PLT 554* 561* 576* 572* 643*   Basic Metabolic Panel: Recent Labs  Lab 10/30/22 0412 10/31/22 0304 10/31/22 2354 11/02/22 0413 11/03/22 0329  NA 131* 134* 132* 131* 131*  K 5.0 5.4* 5.0 4.8 4.5  CL 103 101 102 102 103  CO2 21* 22 21* 21* 21*  GLUCOSE 164* 243* 121* 103* 118*  BUN 26* 27* 31* 39* 41*  CREATININE 1.19* 1.34* 1.31* 1.30* 1.26*  CALCIUM 7.3* 7.4* 7.3* 7.5* 7.4*  MG 1.9 1.6* 2.0 1.8 1.8   GFR: Estimated Creatinine Clearance: 80.3 mL/min (A) (by C-G formula based on SCr of 1.26 mg/dL (H)). Liver Function Tests: No results for input(s): "AST", "ALT", "ALKPHOS", "BILITOT", "PROT", "ALBUMIN" in the last 168 hours. No results for  input(s): "LIPASE", "AMYLASE" in the last 168 hours. No results for input(s): "AMMONIA" in the last 168 hours. Coagulation Profile: No results for input(s): "INR", "PROTIME" in the last 168 hours. Cardiac Enzymes: No results for input(s): "CKTOTAL", "CKMB", "CKMBINDEX", "TROPONINI" in the last 168 hours. BNP (last 3 results) No results for input(s): "PROBNP" in the last 8760 hours. HbA1C: No results for input(s): "HGBA1C" in the last 72 hours. CBG: Recent Labs  Lab 11/03/22 1113 11/03/22 1653 11/03/22 2036 11/04/22 0725 11/04/22 1159  GLUCAP 172* 126* 81 65* 132*   Lipid Profile: No results for input(s): "CHOL", "HDL", "LDLCALC", "TRIG", "CHOLHDL", "LDLDIRECT" in the last 72 hours. Thyroid Function Tests: No results for input(s): "TSH", "T4TOTAL", "FREET4", "T3FREE", "THYROIDAB" in the last 72 hours. Anemia Panel: No results for input(s): "VITAMINB12", "FOLATE", "FERRITIN", "TIBC", "IRON", "RETICCTPCT" in the last 72 hours. Sepsis Labs: No results for input(s): "PROCALCITON", "LATICACIDVEN" in the last 168 hours.  No results found for this or any previous visit (from the past 240 hour(s)).       Radiology Studies: No results found.         LOS: 32 days   Time spent= 35 mins    Miguel Rota, MD [facility]  If 7PM-7AM, please contact night-coverage  11/04/2022, 12:22 PM

## 2022-11-04 NOTE — Plan of Care (Signed)
  Problem: Education: Goal: Knowledge of General Education information will improve Description: Including pain rating scale, medication(s)/side effects and non-pharmacologic comfort measures Outcome: Progressing   Problem: Health Behavior/Discharge Planning: Goal: Ability to manage health-related needs will improve Outcome: Progressing   Problem: Clinical Measurements: Goal: Ability to maintain clinical measurements within normal limits will improve Outcome: Progressing Goal: Will remain free from infection Outcome: Progressing Goal: Diagnostic test results will improve Outcome: Progressing Goal: Respiratory complications will improve Outcome: Progressing Goal: Cardiovascular complication will be avoided Outcome: Progressing   Problem: Activity: Goal: Risk for activity intolerance will decrease Outcome: Progressing   Problem: Nutrition: Goal: Adequate nutrition will be maintained Outcome: Progressing   Problem: Coping: Goal: Level of anxiety will decrease Outcome: Progressing   Problem: Elimination: Goal: Will not experience complications related to bowel motility Outcome: Progressing Goal: Will not experience complications related to urinary retention Outcome: Progressing   Problem: Pain Managment: Goal: General experience of comfort will improve Outcome: Progressing   Problem: Safety: Goal: Ability to remain free from injury will improve Outcome: Progressing   Problem: Skin Integrity: Goal: Risk for impaired skin integrity will decrease Outcome: Progressing   Problem: Coping: Goal: Ability to adjust to condition or change in health will improve Outcome: Progressing   Problem: Fluid Volume: Goal: Ability to maintain a balanced intake and output will improve Outcome: Progressing   Problem: Health Behavior/Discharge Planning: Goal: Ability to identify and utilize available resources and services will improve Outcome: Progressing Goal: Ability to manage  health-related needs will improve Outcome: Progressing   Problem: Nutritional: Goal: Maintenance of adequate nutrition will improve Outcome: Progressing Goal: Progress toward achieving an optimal weight will improve Outcome: Progressing   Problem: Skin Integrity: Goal: Risk for impaired skin integrity will decrease Outcome: Progressing   Problem: Tissue Perfusion: Goal: Adequacy of tissue perfusion will improve Outcome: Progressing   Problem: Education: Goal: Knowledge of the prescribed therapeutic regimen will improve Outcome: Progressing Goal: Ability to verbalize activity precautions or restrictions will improve Outcome: Progressing Goal: Understanding of discharge needs will improve Outcome: Progressing   Problem: Activity: Goal: Ability to perform//tolerate increased activity and mobilize with assistive devices will improve Outcome: Progressing   Problem: Clinical Measurements: Goal: Postoperative complications will be avoided or minimized Outcome: Progressing   Problem: Self-Care: Goal: Ability to meet self-care needs will improve Outcome: Not Progressing   Problem: Self-Concept: Goal: Ability to maintain and perform role responsibilities to the fullest extent possible will improve Outcome: Progressing   Problem: Pain Management: Goal: Pain level will decrease with appropriate interventions Outcome: Progressing   Problem: Skin Integrity: Goal: Demonstration of wound healing without infection will improve Outcome: Progressing   Problem: Activity: Goal: Ability to tolerate increased activity will improve Outcome: Progressing   Problem: Respiratory: Goal: Ability to maintain a clear airway and adequate ventilation will improve Outcome: Progressing

## 2022-11-04 NOTE — H&P (View-Only) (Signed)
Patient ID: Kelli Hudson, female   DOB: 1990/10/02, 32 y.o.   MRN: [id number] Patient has persistent elevated white cell count and now is having increased pain in both thighs.  There is no induration in both thighs that were previously soft and supple.  Will plan for debridement of both thighs tomorrow.  I have discussed this with her mother as well.  White cell count elevated at 33,000.

## 2022-11-04 NOTE — Inpatient Diabetes Management (Addendum)
Inpatient Diabetes Program Recommendations  AACE/ADA: New Consensus Statement on Inpatient Glycemic Control (2015)  Target Ranges:  Prepandial:   less than 140 mg/dL      Peak postprandial:   less than 180 mg/dL (1-2 hours)      Critically ill patients:  140 - 180 mg/dL   Lab Results  Component Value Date   GLUCAP 65 (L) 11/04/2022   HGBA1C 8.8 (A) 06/18/2022    Review of Glycemic Control  Latest Reference Range & Units 11/03/22 07:36 11/03/22 11:13 11/03/22 16:53 11/03/22 20:36 11/04/22 07:25  Glucose-Capillary 70 - 99 mg/dL 413 (H) 244 (H) 010 (H) 81 65 (L)    Diabetes history: DM 2 Outpatient Diabetes medications: Levemir 8 units bid, Novolin R 1-20 units 4x/day SSI Current orders for Inpatient glycemic control:  Semglee 4 units Daily Novolog 0-9 units tid + hs  A1c 8.8% on 06/18/22  Note: hypoglycemia this am 66, elevated renal function  Inpatient Diabetes Program Recommendations:    -  reduce Semglee to 2 units  Thanks,  Christena Deem RN, MSN, BC-ADM Inpatient Diabetes Coordinator Team Pager [phone] (8a-5p)

## 2022-11-05 ENCOUNTER — Inpatient Hospital Stay (HOSPITAL_COMMUNITY): Payer: Medicaid Other | Admitting: Registered Nurse

## 2022-11-05 ENCOUNTER — Encounter (HOSPITAL_COMMUNITY): Payer: Self-pay | Admitting: Orthopedic Surgery

## 2022-11-05 ENCOUNTER — Encounter (HOSPITAL_COMMUNITY)
Admission: EM | Disposition: A | Discharge status: Skilled Nursing Facility | Payer: Self-pay | Source: Home / Self Care | Attending: Internal Medicine

## 2022-11-05 ENCOUNTER — Other Ambulatory Visit: Payer: Self-pay

## 2022-11-05 DIAGNOSIS — L03116 Cellulitis of left lower limb: Secondary | ICD-10-CM

## 2022-11-05 DIAGNOSIS — M726 Necrotizing fasciitis: Secondary | ICD-10-CM | POA: Diagnosis not present

## 2022-11-05 DIAGNOSIS — M25462 Effusion, left knee: Secondary | ICD-10-CM | POA: Diagnosis not present

## 2022-11-05 DIAGNOSIS — E1043 Type 1 diabetes mellitus with diabetic autonomic (poly)neuropathy: Secondary | ICD-10-CM | POA: Diagnosis not present

## 2022-11-05 DIAGNOSIS — L03115 Cellulitis of right lower limb: Secondary | ICD-10-CM

## 2022-11-05 DIAGNOSIS — I10 Essential (primary) hypertension: Secondary | ICD-10-CM | POA: Diagnosis not present

## 2022-11-05 HISTORY — PX: I & D EXTREMITY: SHX5045

## 2022-11-05 LAB — MRSA NEXT GEN BY PCR, NASAL: MRSA by PCR Next Gen: NOT DETECTED

## 2022-11-05 LAB — GLUCOSE, CAPILLARY
Glucose-Capillary: 104 mg/dL — ABNORMAL HIGH (ref 70–99)
Glucose-Capillary: 95 mg/dL (ref 70–99)
Glucose-Capillary: 132 mg/dL — ABNORMAL HIGH (ref 70–99)
Glucose-Capillary: 172 mg/dL — ABNORMAL HIGH (ref 70–99)

## 2022-11-05 SURGERY — IRRIGATION AND DEBRIDEMENT EXTREMITY
Anesthesia: General | Site: Leg Upper | Laterality: Bilateral

## 2022-11-05 MED ORDER — ONDANSETRON HCL 4 MG/2ML IJ SOLN
INTRAMUSCULAR | Status: DC | PRN
  Administered 2022-11-05: 4 mg via INTRAVENOUS

## 2022-11-05 MED ORDER — CEFAZOLIN SODIUM-DEXTROSE 2-4 GM/100ML-% IV SOLN
2.0000 g | Freq: Three times a day (TID) | INTRAVENOUS | Status: DC
  Administered 2022-11-05 – 2022-11-06 (×3): 2 g via INTRAVENOUS
  Filled 2022-11-05 (×3): qty 100

## 2022-11-05 MED ORDER — FENTANYL CITRATE (PF) 100 MCG/2ML IJ SOLN
25.0000 ug | INTRAMUSCULAR | Status: DC | PRN
  Administered 2022-11-05 (×2): 50 ug via INTRAVENOUS

## 2022-11-05 MED ORDER — VANCOMYCIN HCL 1000 MG IV SOLR
INTRAVENOUS | Status: DC | PRN
Start: 2022-11-05 — End: 2022-11-05
  Administered 2022-11-05: 1500 mg via TOPICAL

## 2022-11-05 MED ORDER — LACTATED RINGERS IV SOLN: INTRAVENOUS | Status: DC

## 2022-11-05 MED ORDER — PHENYLEPHRINE 80 MCG/ML (10ML) SYRINGE FOR IV PUSH (FOR BLOOD PRESSURE SUPPORT)
PREFILLED_SYRINGE | INTRAVENOUS | Status: AC
  Filled 2022-11-05: qty 10

## 2022-11-05 MED ORDER — PROPOFOL 10 MG/ML IV BOLUS
INTRAVENOUS | Status: AC
  Filled 2022-11-05: qty 20

## 2022-11-05 MED ORDER — PROMETHAZINE HCL 25 MG/ML IJ SOLN: 6.2500 mg | INTRAMUSCULAR | Status: DC | PRN

## 2022-11-05 MED ORDER — FENTANYL CITRATE (PF) 250 MCG/5ML IJ SOLN
INTRAMUSCULAR | Status: DC | PRN
  Administered 2022-11-05 (×2): 50 ug via INTRAVENOUS

## 2022-11-05 MED ORDER — VANCOMYCIN HCL 1000 MG IV SOLR
INTRAVENOUS | Status: AC
  Filled 2022-11-05: qty 60

## 2022-11-05 MED ORDER — OXYCODONE HCL 5 MG/5ML PO SOLN: 5.0000 mg | Freq: Once | ORAL | Status: DC | PRN

## 2022-11-05 MED ORDER — ACETAMINOPHEN 10 MG/ML IV SOLN
INTRAVENOUS | Status: AC
  Filled 2022-11-05: qty 100

## 2022-11-05 MED ORDER — CHLORHEXIDINE GLUCONATE 0.12 % MT SOLN: 15.0000 mL | Freq: Once | OROMUCOSAL | Status: AC

## 2022-11-05 MED ORDER — ORAL CARE MOUTH RINSE: 15.0000 mL | Freq: Once | OROMUCOSAL | Status: AC

## 2022-11-05 MED ORDER — DOCUSATE SODIUM 100 MG PO CAPS: 100.0000 mg | ORAL_CAPSULE | Freq: Two times a day (BID) | ORAL | Status: DC

## 2022-11-05 MED ORDER — ONDANSETRON HCL 4 MG/2ML IJ SOLN
INTRAMUSCULAR | Status: AC
  Filled 2022-11-05: qty 4

## 2022-11-05 MED ORDER — SUGAMMADEX SODIUM 200 MG/2ML IV SOLN
INTRAVENOUS | Status: DC | PRN
  Administered 2022-11-05: 100 mg via INTRAVENOUS

## 2022-11-05 MED ORDER — MIDAZOLAM HCL 2 MG/2ML IJ SOLN
INTRAMUSCULAR | Status: DC | PRN
  Administered 2022-11-05: 1 mg via INTRAVENOUS

## 2022-11-05 MED ORDER — LIDOCAINE 2% (20 MG/ML) 5 ML SYRINGE
INTRAMUSCULAR | Status: AC
  Filled 2022-11-05: qty 5

## 2022-11-05 MED ORDER — ALBUMIN HUMAN 5 % IV SOLN
INTRAVENOUS | Status: DC | PRN
Start: 2022-11-05 — End: 2022-11-05

## 2022-11-05 MED ORDER — MAGNESIUM CITRATE PO SOLN: 1.0000 | Freq: Once | ORAL | Status: DC | PRN

## 2022-11-05 MED ORDER — ONDANSETRON HCL 4 MG/2ML IJ SOLN
INTRAMUSCULAR | Status: AC
  Filled 2022-11-05: qty 2

## 2022-11-05 MED ORDER — BISACODYL 10 MG RE SUPP: 10.0000 mg | Freq: Every day | RECTAL | Status: DC | PRN

## 2022-11-05 MED ORDER — AMISULPRIDE (ANTIEMETIC) 5 MG/2ML IV SOLN: 10.0000 mg | Freq: Once | INTRAVENOUS | Status: DC | PRN

## 2022-11-05 MED ORDER — POLYETHYLENE GLYCOL 3350 17 G PO PACK: 17.0000 g | PACK | Freq: Every day | ORAL | Status: DC | PRN

## 2022-11-05 MED ORDER — METOCLOPRAMIDE HCL 5 MG/ML IJ SOLN: 5.0000 mg | Freq: Three times a day (TID) | INTRAMUSCULAR | Status: DC | PRN

## 2022-11-05 MED ORDER — METHOCARBAMOL 1000 MG/10ML IJ SOLN
500.0000 mg | Freq: Four times a day (QID) | INTRAVENOUS | Status: DC | PRN
  Administered 2022-11-13: 500 mg via INTRAVENOUS
  Filled 2022-11-05: qty 5
  Filled 2022-11-05: qty 500

## 2022-11-05 MED ORDER — ACETAMINOPHEN 10 MG/ML IV SOLN
1000.0000 mg | Freq: Once | INTRAVENOUS | Status: DC | PRN
  Administered 2022-11-05: 1000 mg via INTRAVENOUS

## 2022-11-05 MED ORDER — CHLORHEXIDINE GLUCONATE 0.12 % MT SOLN
OROMUCOSAL | Status: AC
  Administered 2022-11-05: 15 mL via OROMUCOSAL
  Filled 2022-11-05: qty 15

## 2022-11-05 MED ORDER — HYDROMORPHONE HCL 1 MG/ML IJ SOLN
INTRAMUSCULAR | Status: AC
  Filled 2022-11-05: qty 1

## 2022-11-05 MED ORDER — MIDAZOLAM HCL 2 MG/2ML IJ SOLN
INTRAMUSCULAR | Status: AC
  Filled 2022-11-05: qty 2

## 2022-11-05 MED ORDER — FENTANYL CITRATE (PF) 100 MCG/2ML IJ SOLN
INTRAMUSCULAR | Status: AC
  Filled 2022-11-05: qty 2

## 2022-11-05 MED ORDER — HYDROMORPHONE HCL 1 MG/ML IJ SOLN
0.5000 mg | INTRAMUSCULAR | Status: DC | PRN
  Administered 2022-11-05: 0.5 mg via INTRAVENOUS

## 2022-11-05 MED ORDER — ROCURONIUM BROMIDE 10 MG/ML (PF) SYRINGE
PREFILLED_SYRINGE | INTRAVENOUS | Status: DC | PRN
  Administered 2022-11-05: 20 mg via INTRAVENOUS

## 2022-11-05 MED ORDER — SODIUM CHLORIDE 0.9 % IV SOLN: INTRAVENOUS | Status: DC

## 2022-11-05 MED ORDER — LIDOCAINE 2% (20 MG/ML) 5 ML SYRINGE
INTRAMUSCULAR | Status: DC | PRN
  Administered 2022-11-05: 60 mg via INTRAVENOUS

## 2022-11-05 MED ORDER — PROPOFOL 10 MG/ML IV BOLUS
INTRAVENOUS | Status: DC | PRN
  Administered 2022-11-05: 150 mg via INTRAVENOUS
  Administered 2022-11-05: 20 mg via INTRAVENOUS

## 2022-11-05 MED ORDER — FENTANYL CITRATE (PF) 250 MCG/5ML IJ SOLN
INTRAMUSCULAR | Status: AC
  Filled 2022-11-05: qty 5

## 2022-11-05 MED ORDER — OXYCODONE HCL 5 MG PO TABS: 5.0000 mg | ORAL_TABLET | Freq: Once | ORAL | Status: DC | PRN

## 2022-11-05 MED ORDER — 0.9 % SODIUM CHLORIDE (POUR BTL) OPTIME
TOPICAL | Status: DC | PRN
  Administered 2022-11-05: 1000 mL

## 2022-11-05 MED ORDER — ROCURONIUM BROMIDE 10 MG/ML (PF) SYRINGE
PREFILLED_SYRINGE | INTRAVENOUS | Status: AC
  Filled 2022-11-05: qty 10

## 2022-11-05 MED ORDER — METHOCARBAMOL 500 MG PO TABS: 500.0000 mg | ORAL_TABLET | Freq: Four times a day (QID) | ORAL | Status: DC | PRN

## 2022-11-05 MED ORDER — PHENYLEPHRINE 80 MCG/ML (10ML) SYRINGE FOR IV PUSH (FOR BLOOD PRESSURE SUPPORT)
PREFILLED_SYRINGE | INTRAVENOUS | Status: DC | PRN
  Administered 2022-11-05: 160 ug via INTRAVENOUS
  Administered 2022-11-05: 80 ug via INTRAVENOUS
  Administered 2022-11-05: 160 ug via INTRAVENOUS
  Administered 2022-11-05: 80 ug via INTRAVENOUS
  Administered 2022-11-05: 160 ug via INTRAVENOUS

## 2022-11-05 MED ORDER — METOCLOPRAMIDE HCL 5 MG PO TABS
5.0000 mg | ORAL_TABLET | Freq: Three times a day (TID) | ORAL | Status: DC | PRN
  Filled 2022-11-05: qty 1

## 2022-11-05 SURGICAL SUPPLY — 43 items
BAG COUNTER SPONGE SURGICOUNT (BAG) IMPLANT
BAG SPNG CNTER NS LX DISP (BAG)
BLADE SURG 21 STRL SS (BLADE) ×1 IMPLANT
BNDG CMPR 5X6 CHSV STRCH STRL (GAUZE/BANDAGES/DRESSINGS)
BNDG COHESIVE 6X5 TAN ST LF (GAUZE/BANDAGES/DRESSINGS) IMPLANT
BNDG GAUZE DERMACEA FLUFF 4 (GAUZE/BANDAGES/DRESSINGS) ×2 IMPLANT
BNDG GZE DERMACEA 4 6PLY (GAUZE/BANDAGES/DRESSINGS) ×2
CANISTER WOUNDNEG PRESSURE 500 (CANNISTER) IMPLANT
CANNULA 5.75X7 CRYSTAL CLEAR (CANNULA) IMPLANT
CLEANSER WND VASHE INSTL 34OZ (WOUND CARE) IMPLANT
COVER SURGICAL LIGHT HANDLE (MISCELLANEOUS) ×2 IMPLANT
DRAPE DERMATAC (DRAPES) IMPLANT
DRAPE U-SHAPE 47X51 STRL (DRAPES) ×1 IMPLANT
DRESSING PREVENA PLUS CUSTOM (GAUZE/BANDAGES/DRESSINGS) IMPLANT
DRESSING VERAFLO CLEANS CC MED (GAUZE/BANDAGES/DRESSINGS) IMPLANT
DRSG ADAPTIC 3X8 NADH LF (GAUZE/BANDAGES/DRESSINGS) ×1 IMPLANT
DRSG PREVENA PLUS CUSTOM (GAUZE/BANDAGES/DRESSINGS) ×2 IMPLANT
DRSG VERAFLO CLEANSE CC MED (GAUZE/BANDAGES/DRESSINGS) ×3 IMPLANT
DURAPREP 26ML APPLICATOR (WOUND CARE) ×1 IMPLANT
ELECT REM PT RETURN 9FT ADLT (ELECTROSURGICAL) IMPLANT
ELECTRODE REM PT RTRN 9FT ADLT (ELECTROSURGICAL) IMPLANT
GAUZE SPONGE 4X4 12PLY STRL (GAUZE/BANDAGES/DRESSINGS) ×1 IMPLANT
GLOVE BIOGEL PI IND STRL 9 (GLOVE) ×1 IMPLANT
GLOVE SURG ORTHO 9.0 STRL STRW (GLOVE) ×1 IMPLANT
GOWN STRL REUS W/ TWL XL LVL3 (GOWN DISPOSABLE) ×2 IMPLANT
GOWN STRL REUS W/TWL XL LVL3 (GOWN DISPOSABLE) ×2
GRAFT SKIN WND SURGICLOSE M95 (Tissue) IMPLANT
HANDPIECE INTERPULSE COAX TIP (DISPOSABLE)
KIT BASIN OR (CUSTOM PROCEDURE TRAY) ×1 IMPLANT
KIT TURNOVER KIT B (KITS) ×1 IMPLANT
MANIFOLD NEPTUNE II (INSTRUMENTS) ×1 IMPLANT
NS IRRIG 1000ML POUR BTL (IV SOLUTION) ×1 IMPLANT
PACK ORTHO EXTREMITY (CUSTOM PROCEDURE TRAY) ×1 IMPLANT
PAD ARMBOARD 7.5X6 YLW CONV (MISCELLANEOUS) ×2 IMPLANT
PAD NEG PRESSURE SENSATRAC (MISCELLANEOUS) IMPLANT
SET HNDPC FAN SPRY TIP SCT (DISPOSABLE) IMPLANT
STOCKINETTE IMPERVIOUS 9X36 MD (GAUZE/BANDAGES/DRESSINGS) IMPLANT
SUT ETHILON 2 0 PSLX (SUTURE) ×1 IMPLANT
SWAB COLLECTION DEVICE MRSA (MISCELLANEOUS) ×1 IMPLANT
SWAB CULTURE ESWAB REG 1ML (MISCELLANEOUS) IMPLANT
TOWEL GREEN STERILE (TOWEL DISPOSABLE) ×1 IMPLANT
TUBE CONNECTING 12X1/4 (SUCTIONS) ×1 IMPLANT
YANKAUER SUCT BULB TIP NO VENT (SUCTIONS) ×1 IMPLANT

## 2022-11-05 NOTE — Progress Notes (Signed)
Relayed concern regarding pt's condition through [employer]. Awaiting call back from on call.

## 2022-11-05 NOTE — Interval H&P Note (Signed)
History and Physical Interval Note:  11/05/2022 6:44 AM  [name]  has presented today for surgery, with the diagnosis of Cellulitis Bilateral Thighs.  The various methods of treatment have been discussed with the patient and family. After consideration of risks, benefits and other options for treatment, the patient has consented to  Procedure(s): BILATERAL THIGH DEBRIDEMENT (Bilateral) as a surgical intervention.  The patient's history has been reviewed, patient examined, no change in status, stable for surgery.  I have reviewed the patient's chart and labs.  Questions were answered to the patient's satisfaction.     Nadara Mustard

## 2022-11-05 NOTE — Plan of Care (Signed)
  Problem: Pain Managment: Goal: General experience of comfort will improve Outcome: Progressing   Problem: Safety: Goal: Ability to remain free from injury will improve Outcome: Progressing   

## 2022-11-05 NOTE — Transfer of Care (Signed)
Immediate Anesthesia Transfer of Care Note  Patient: Kelli Hudson  Procedure(s) Performed: BILATERAL THIGH DEBRIDEMENT (Bilateral: Leg Upper)  Patient Location: PACU  Anesthesia Type:General  Level of Consciousness: awake, alert , and oriented  Airway & Oxygen Therapy: Patient Spontanous Breathing  Post-op Assessment: Report given to RN and Post -op Vital signs reviewed and stable  Post vital signs: Reviewed and stable  Last Vitals:  Vitals Value Taken Time  BP 126/85 11/05/22 1245  Temp    Pulse 84   Resp 13 11/05/22 1245  SpO2 100   Vitals shown include unfiled device data.  Last Pain:  Vitals:   11/05/22 1045  TempSrc:   PainSc: 8       Patients Stated Pain Goal: 3 (11/05/22 1045)  Complications: There were no known notable events for this encounter.

## 2022-11-05 NOTE — Progress Notes (Signed)
PROGRESS NOTE    Kelli Hudson  ZHY:[id number] DOB: [birth year] DOA: 10/03/2022 PCP: Norm Salt, PA    Brief Narrative:  32 y.o. female with medical history significant of DM1, diabetic gastroparesis with very frequent admission in the past, HTN presented w/ c/o B foot wounds, fever, and pain and informed that she has been dealing with lower extremity wound for past 2 weeks ,followed with her podiatrist in the outpatient setting at [facility] there which she recently finished a course of doxycycline which did not help her symptoms, initially had a blister that ruptured subsequently with redness streaking up her legs and noticing fever chills worsening pain in last few days.  Patient admitted for sepsis due to cellulitis/lower extremity wound, severe anemia, AKI. Podiatry consulted- Unable to complete MRI as gastric pacemaker is not MRI safe.  ID and Ortho were consulted.  Patient went to the OR for bilateral lower extremity necrotizing fasciitis feet to his thigh, remained intubated and required PRBC and FFP transfusion during the case on 8/21.  Eventually extubated on 8/23 and was taken back to the OR on 8/26 for wound VAC change.  Patient has remained on IV antibiotics.  Also underwent bilateral lower extremity BKA by Dr. Lajoyce Corners on 9/6. Since her surgery intermittently having anemia without any obvious signs of blood loss requiring PRBC transfusion, WBC still remains elevated but stable, no antibiotics per ID.  Repeat CT showed persistent fluid collection, plans for OR by Dr. Lajoyce Corners on 9/18.   Assessment & Plan:   Principal Problem:   Necrotizing fasciitis (HCC) Active Problems:   AKI (acute kidney injury) (HCC)   Sepsis due to cellulitis (HCC)   Cellulitis of lower extremity   Anemia   Diabetic gastroparesis associated with type 1 diabetes mellitus (HCC)   DM type 1 (diabetes mellitus, type 1) (HCC)   Drug-seeking behavior   Sepsis (HCC)   Necrotizing fasciitis of  pelvic region and thigh (HCC)   Necrotizing fasciitis of lower leg (HCC)   MDD (major depressive disorder), recurrent episode, moderate (HCC)   Severe sepsis due to bilateral feet abscess, necrotizing fasciitis, POA.  MRSE, sensitive Pseudomonas S/p bilateral transtibial amputation Initially underwent I&D on 8/21 with wound VAC change on 8/26 but ultimately required bilateral BKA by orthopedic on 9/6.  CT of bilateral lower extremity showing postsurgical changes with some edema, gaseous changes and some hematoma?.  Plans for OR per Dr. Lajoyce Corners today   Acute blood loss anemia due to operative blood loss Received multiple units of PRBC, IV iron.   Type 1 diabetes, poorly controlled with intermittent hypoglycemia Continue current sliding scale and Accu-Chek.  Adjust as necessary   Acute kidney injury on CKD stage IIIa due to septic ATN, improving Non-anion gap metabolic acidosis Hyponatremia, euvolemic Resolved now back to baseline creatinine 1.2.  Peaked at 2.54   Severe protein calorie malnutrition Cortrak removed 9/9.  Encouraging oral diet, dietitian following.  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  On Lasix  Mild hyperkalemia - Stable  Hypomagnesemia - PRN repletion   PT/OT-SNF Prior to discharge remove right upper extremity PICC line if no longer further requires it.   DVT prophylaxis: SCD   Code Status: Full Code  Family Communication: Mom up to date   Status is: Inpatient Remains inpatient appropriate because: Plans  for OR by orthopedic today           Subjective:  Gone to OR.  Chart reviewed, discussed with RN   (I am covering another hospital as well). Will try to stop by later again or call to check in on her.  Examination: Deferred as patient had gone to the  OR  Pressure Injury 10/07/22 Elbow Posterior;Right Stage 2 -  Partial thickness loss of dermis presenting as a shallow open injury with a red, pink wound bed without slough. (Active)  10/07/22 1226  Location: Elbow  Location Orientation: Posterior;Right  Staging: Stage 2 -  Partial thickness loss of dermis presenting as a shallow open injury with a red, pink wound bed without slough.  Wound Description (Comments):   Present on Admission:      Diet Orders (From admission, onward)     Start     Ordered   11/05/22 0430  Diet NPO time specified  Diet effective ____        11/04/22 1610            Objective: Vitals:   11/04/22 2157 11/05/22 0516 11/05/22 0731 11/05/22 1031  BP: 130/71 (!) 154/92 112/70 109/71  Pulse: 100 100 87 81  Resp:  15 16 18   Temp: 98.7 F (37.1 C) 98.6 F (37 C) 98.3 F (36.8 C) 97.9 F (36.6 C)  TempSrc: Oral Oral Oral   SpO2: 100% 99% (!) 89% 99%  Weight:    104.2 kg  Height:    5\' 7"  (1.702 m)    Intake/Output Summary (Last 24 hours) at 11/05/2022 1207 Last data filed at 11/05/2022 0500 Gross per 24 hour  Intake 120 ml  Output --  Net 120 ml   Filed Weights   10/31/22 0500 11/03/22 0332 11/05/22 1031  Weight: 105.3 kg 104.2 kg 104.2 kg    Scheduled Meds:  [MAR Hold] sodium chloride   Intravenous Once   [MAR Hold] acetaminophen  1,000 mg Oral Q6H   [MAR Hold] vitamin C  1,000 mg Oral Daily   chlorhexidine  60 mL Topical Once   [MAR Hold] Chlorhexidine Gluconate Cloth  6 each Topical Daily   [MAR Hold] Chlorhexidine Gluconate Cloth  6 each Topical Q0600   [MAR Hold] docusate sodium  100 mg Oral BID   [MAR Hold] DULoxetine  30 mg Oral Daily   [MAR Hold] folic acid  1 mg Oral Daily   [MAR Hold] furosemide  40 mg Oral Daily   [MAR Hold] gabapentin  300 mg Oral Q8H   [MAR Hold] heparin injection (subcutaneous)  5,000 Units Subcutaneous Q8H   [MAR Hold] hydrocortisone cream   Topical TID   [MAR Hold] insulin aspart  0-5 Units  Subcutaneous QHS   [MAR Hold] insulin aspart  0-9 Units Subcutaneous TID WC   [MAR Hold] methocarbamol  1,000 mg Oral TID   [MAR Hold] metoCLOPramide  10 mg Oral Q8H   [MAR Hold] metoprolol tartrate  25 mg Oral BID   [MAR Hold] multivitamin with minerals  1 tablet Oral Daily   [MAR Hold] nutrition supplement (JUVEN)  1 packet Oral BID BM   [MAR Hold] omeprazole  40 mg Oral Daily   [MAR Hold] mouth rinse  15 mL Mouth Rinse 4 times per day   [MAR Hold] silver nitrate applicators  1 Application Topical Once   [MAR Hold] sodium chloride flush  10-40 mL Intracatheter Q12H   [MAR Hold] zinc sulfate  220 mg Oral Daily  Continuous Infusions:  [MAR Hold] sodium chloride 10 mL/hr at 11/01/22 2000   lactated ringers 10 mL/hr at 11/05/22 1050   [MAR Hold] magnesium sulfate bolus IVPB      Nutritional status Signs/Symptoms: estimated needs Interventions: Juven, Glucerna shake Body mass index is 35.98 kg/m.  Data Reviewed:   CBC: Recent Labs  Lab 10/31/22 2354 11/02/22 0413 11/03/22 0329 11/04/22 1021 11/05/22 0400  WBC 44.6* 34.8* 33.3* 40.6* 44.9*  HGB 7.5* 7.0* 8.0* 8.5* 7.5*  HCT 23.4* 22.0* 25.3* 27.0* 24.6*  MCV 93.2 92.8 92.3 93.8 94.3  PLT 561* 576* 572* 643* 572*   Basic Metabolic Panel: Recent Labs  Lab 10/31/22 0304 10/31/22 2354 11/02/22 0413 11/03/22 0329 11/04/22 1021  NA 134* 132* 131* 131* 131*  K 5.4* 5.0 4.8 4.5 4.6  CL 101 102 102 103 104  CO2 22 21* 21* 21* 20*  GLUCOSE 243* 121* 103* 118* 118*  BUN 27* 31* 39* 41* 33*  CREATININE 1.34* 1.31* 1.30* 1.26* 1.13*  CALCIUM 7.4* 7.3* 7.5* 7.4* 7.3*  MG 1.6* 2.0 1.8 1.8 1.6*   GFR: Estimated Creatinine Clearance: 89.5 mL/min (A) (by C-G formula based on SCr of 1.13 mg/dL (H)). Liver Function Tests: No results for input(s): "AST", "ALT", "ALKPHOS", "BILITOT", "PROT", "ALBUMIN" in the last 168 hours. No results for input(s): "LIPASE", "AMYLASE" in the last 168 hours. No results for input(s): "AMMONIA" in  the last 168 hours. Coagulation Profile: No results for input(s): "INR", "PROTIME" in the last 168 hours. Cardiac Enzymes: No results for input(s): "CKTOTAL", "CKMB", "CKMBINDEX", "TROPONINI" in the last 168 hours. BNP (last 3 results) No results for input(s): "PROBNP" in the last 8760 hours. HbA1C: No results for input(s): "HGBA1C" in the last 72 hours. CBG: Recent Labs  Lab 11/04/22 0725 11/04/22 1159 11/04/22 1600 11/04/22 2118 11/05/22 0729  GLUCAP 65* 132* 158* 123* 104*   Lipid Profile: No results for input(s): "CHOL", "HDL", "LDLCALC", "TRIG", "CHOLHDL", "LDLDIRECT" in the last 72 hours. Thyroid Function Tests: No results for input(s): "TSH", "T4TOTAL", "FREET4", "T3FREE", "THYROIDAB" in the last 72 hours. Anemia Panel: No results for input(s): "VITAMINB12", "FOLATE", "FERRITIN", "TIBC", "IRON", "RETICCTPCT" in the last 72 hours. Sepsis Labs: No results for input(s): "PROCALCITON", "LATICACIDVEN" in the last 168 hours.  Recent Results (from the past 240 hour(s))  MRSA Next Gen by PCR, Nasal     Status: None   Collection Time: 11/05/22  6:22 AM   Specimen: Nasal Mucosa; Nasal Swab  Result Value Ref Range Status   MRSA by PCR Next Gen NOT DETECTED NOT DETECTED Final    Comment: (NOTE) The GeneXpert MRSA Assay (FDA approved for NASAL specimens only), is one component of a comprehensive MRSA colonization surveillance program. It is not intended to diagnose MRSA infection nor to guide or monitor treatment for MRSA infections. Test performance is not FDA approved in patients less than 50 years old. Performed at [facility], [address] [place], [place] [address]          Radiology Studies: No results found.         LOS: 33 days   Time spent= 35 mins    Miguel Rota, MD [facility]  If 7PM-7AM, please contact night-coverage  11/05/2022, 12:07 PM

## 2022-11-05 NOTE — Progress Notes (Signed)
Talk with [facility] after 5pm on -call person to report pt's condition regarding oozing blood mixed with fluid from left thigh ; wound vac not clogged; CN and this writer resealed the site twice but it feels like there's just more fluid/blood that the wound vac can handle;Pt and family were unhappy with situation; right wound vac canister replaced;Awaiting for On-Call person to reply as of this writing.

## 2022-11-05 NOTE — Progress Notes (Signed)
PT Cancellation Note  Patient Details Name: Kelli Hudson MRN: [id number] DOB: Jun 21, 1990   Cancelled Treatment:    Reason Eval/Treat Not Completed: (P) Patient at procedure or test/unavailable Pt has returned to OR for I&D of bilateral thighs. PT will follow back tomorrow.  Kelli Benda B. Beverely Risen PT, DPT Acute Rehabilitation Services Please use secure chat or  Call Office [phone]    Kelli Hudson [facility] 11/05/2022, 10:27 AM

## 2022-11-05 NOTE — Anesthesia Preprocedure Evaluation (Addendum)
Anesthesia Evaluation  Patient identified by MRN, date of birth, ID band Patient awake    Reviewed: Allergy & Precautions, NPO status , Patient's Chart, lab work & pertinent test results  Airway Mallampati: II  TM Distance: >3 FB Neck ROM: Full    Dental no notable dental hx.    Pulmonary neg pulmonary ROS   Pulmonary exam normal        Cardiovascular hypertension, Pt. on home beta blockers and Pt. on medications Normal cardiovascular exam     Neuro/Psych  PSYCHIATRIC DISORDERS Anxiety Depression     Neuromuscular disease    GI/Hepatic Neg liver ROS, PUD,GERD  Medicated,,  Endo/Other  diabetes, Insulin Dependent    Renal/GU Renal InsufficiencyRenal disease     Musculoskeletal negative musculoskeletal ROS (+)    Abdominal  (+) + obese  Peds  Hematology  (+) Blood dyscrasia, anemia   Anesthesia Other Findings Cellulitis Bilateral Thighs  Reproductive/Obstetrics Hcg negative                              Anesthesia Physical Anesthesia Plan  ASA: 3  Anesthesia Plan: General   Post-op Pain Management:    Induction: Intravenous  PONV Risk Score and Plan: 3 and Ondansetron, Dexamethasone, Midazolam and Treatment may vary due to age or medical condition  Airway Management Planned: Oral ETT  Additional Equipment:   Intra-op Plan:   Post-operative Plan: Extubation in OR  Informed Consent: I have reviewed the patients History and Physical, chart, labs and discussed the procedure including the risks, benefits and alternatives for the proposed anesthesia with the patient or authorized representative who has indicated his/her understanding and acceptance.     Dental advisory given  Plan Discussed with: CRNA  Anesthesia Plan Comments:          Anesthesia Quick Evaluation

## 2022-11-05 NOTE — Plan of Care (Signed)
Problem: Education: Goal: Knowledge of General Education information will improve Description: Including pain rating scale, medication(s)/side effects and non-pharmacologic comfort measures Outcome: Progressing   Problem: Health Behavior/Discharge Planning: Goal: Ability to manage health-related needs will improve Outcome: Progressing   Problem: Clinical Measurements: Goal: Ability to maintain clinical measurements within normal limits will improve Outcome: Progressing Goal: Will remain free from infection Outcome: Progressing Goal: Diagnostic test results will improve Outcome: Progressing Goal: Respiratory complications will improve Outcome: Progressing Goal: Cardiovascular complication will be avoided Outcome: Progressing   Problem: Activity: Goal: Risk for activity intolerance will decrease Outcome: Progressing   Problem: Nutrition: Goal: Adequate nutrition will be maintained Outcome: Progressing   Problem: Coping: Goal: Level of anxiety will decrease Outcome: Progressing   Problem: Elimination: Goal: Will not experience complications related to bowel motility Outcome: Progressing Goal: Will not experience complications related to urinary retention Outcome: Progressing   Problem: Pain Managment: Goal: General experience of comfort will improve Outcome: Progressing   Problem: Safety: Goal: Ability to remain free from injury will improve Outcome: Progressing   Problem: Skin Integrity: Goal: Risk for impaired skin integrity will decrease Outcome: Progressing   Problem: Coping: Goal: Ability to adjust to condition or change in health will improve Outcome: Progressing   Problem: Fluid Volume: Goal: Ability to maintain a balanced intake and output will improve Outcome: Progressing   Problem: Health Behavior/Discharge Planning: Goal: Ability to identify and utilize available resources and services will improve Outcome: Progressing Goal: Ability to manage  health-related needs will improve Outcome: Progressing   Problem: Nutritional: Goal: Maintenance of adequate nutrition will improve Outcome: Progressing Goal: Progress toward achieving an optimal weight will improve Outcome: Progressing   Problem: Skin Integrity: Goal: Risk for impaired skin integrity will decrease Outcome: Progressing   Problem: Tissue Perfusion: Goal: Adequacy of tissue perfusion will improve Outcome: Progressing   Problem: Education: Goal: Knowledge of the prescribed therapeutic regimen will improve Outcome: Progressing Goal: Ability to verbalize activity precautions or restrictions will improve Outcome: Progressing Goal: Understanding of discharge needs will improve Outcome: Progressing   Problem: Activity: Goal: Ability to perform//tolerate increased activity and mobilize with assistive devices will improve Outcome: Progressing   Problem: Clinical Measurements: Goal: Postoperative complications will be avoided or minimized Outcome: Progressing   Problem: Self-Care: Goal: Ability to meet self-care needs will improve Outcome: Not Progressing   Problem: Self-Concept: Goal: Ability to maintain and perform role responsibilities to the fullest extent possible will improve Outcome: Progressing   Problem: Pain Management: Goal: Pain level will decrease with appropriate interventions Outcome: Progressing   Problem: Skin Integrity: Goal: Demonstration of wound healing without infection will improve Outcome: Progressing   Problem: Activity: Goal: Ability to tolerate increased activity will improve Outcome: Progressing   Problem: Respiratory: Goal: Ability to maintain a clear airway and adequate ventilation will improve Outcome: Progressing

## 2022-11-05 NOTE — Anesthesia Procedure Notes (Signed)
Procedure Name: Intubation Date/Time: 11/05/2022 11:20 AM  Performed by: Sharyn Dross, CRNAPre-anesthesia Checklist: Patient identified, Emergency Drugs available, Suction available and Patient being monitored Patient Re-evaluated:Patient Re-evaluated prior to induction Oxygen Delivery Method: Circle system utilized Preoxygenation: Pre-oxygenation with 100% oxygen Induction Type: IV induction Ventilation: Mask ventilation without difficulty Laryngoscope Size: Mac and 4 Grade View: Grade I Tube type: Oral Tube size: 7.0 mm Number of attempts: 1 Airway Equipment and Method: Stylet and Oral airway Placement Confirmation: ETT inserted through vocal cords under direct vision, positive ETCO2 and breath sounds checked- equal and bilateral Secured at: 22 cm Tube secured with: Tape Dental Injury: Teeth and Oropharynx as per pre-operative assessment

## 2022-11-05 NOTE — Progress Notes (Signed)
Oozing blood was coming out from surgical dressing on her left leg. Dressing was pulled and cut off and massive clots were removed covering pt's inner thigh and around the surgical incision. Dressing was then replaced and reinforced with tegaderm and vac drape. Canister was changed and had 380 ml bright red output. Wound vac has good suction and no more leaking is noted. PA Luke, came in at the bedside and assessed pt's surgical site. Instructed to use ioban dressing if leaking happens again and to notify him.

## 2022-11-05 NOTE — Progress Notes (Signed)
CCC Pre-op Review  Pre-op checklist:  INCOMPLETE at time of note  NPO:  yes  Labs:  H/H  7.5 / 24.6 **  glu 118  CBG this am 118  Consent: completed  H&P:  complete    Vitals:   98.3  p88 r16  112/70  89% RA @  0730  O2 requirements:   RA  MAR/PTA review:  Heparin at  0620  IV:   DL PICC  Floor nurse name:    Sidonie Dickens RN  [id number]  Additional info:   * TYPE 1 DM * --> no documented insulin at time of note!

## 2022-11-05 NOTE — Progress Notes (Signed)
Pharmacy Antibiotic Note  Kelli Hudson is a 32 y.o. female admitted on 10/03/2022 with bilateral thigh cellulitis with L knee effusion s/p debridement.  Pharmacy has been consulted for Cefazolin dosing.  Plan: Cefazolin 2g IV q8h Follow up renal function, cultures as available, clinical progress, length of tx   Height: 5\' 7"  (170.2 cm) Weight: 104.2 kg (229 lb 11.5 oz) IBW/kg (Calculated) : 61.6  Temp (24hrs), Avg:98.2 F (36.8 C), Min:97.8 F (36.6 C), Max:98.7 F (37.1 C)  Recent Labs  Lab 10/31/22 0304 10/31/22 2354 11/02/22 0413 11/03/22 0329 11/04/22 1021 11/05/22 0400  WBC 39.6* 44.6* 34.8* 33.3* 40.6* 44.9*  CREATININE 1.34* 1.31* 1.30* 1.26* 1.13*  --     Estimated Creatinine Clearance: 89.5 mL/min (A) (by C-G formula based on SCr of 1.13 mg/dL (H)).    Allergies  Allergen Reactions   Ibuprofen Other (See Comments)    07/23/22 - Worsening kidney function after 3 doses of ibuprofen 400mg .  Creatinine increased from 1.3 to 2.  Creatinine returned to normal after stopping.   Lisinopril Cough, Other (See Comments), Itching and Rash    Breakout in rash    Outpt Doxy 8/8 x 10 days Cefepime 8/16 >9/1 Vancomycin 8/16 > 8/21 Flagyl 8/16>>9/1 Cefazolin 9/6 x 1 Zyvox 8/20 >> 8/24; 8/25 >9/9 Merrem 9/1 >9/9 Cefazolin 9/18 >>   8/16 Bcx: NGF 8/16 resp panel: negative 8/18 R Foot Wound: ngtd 8/21 R calf tissue: staph aureus, MRSE 8/21 L thigh: corynebacterium 8/21 R foot tissue: MSSA, PsA (pan-susceptible) 8/31 Cdif negative 8/29 Bcx: negative 9/6 R knee:NGF 9/18 Thigh: 9/18 L knee:   Thank you for allowing pharmacy to be a part of this patient's care.  Loralee Pacas, PharmD, BCPS 11/05/2022 3:08 PM  Please check AMION for all [facility] Pharmacy phone numbers After 10:00 PM, call Main Pharmacy [phone]

## 2022-11-05 NOTE — Progress Notes (Signed)
Attempted to call twice [facility] on call for tonight, regarding patient's condition post surgery, no answer.

## 2022-11-05 NOTE — Progress Notes (Signed)
OT Cancellation Note  Patient Details Name: Kelli Hudson MRN: [id number] DOB: April 13, 1990   Cancelled Treatment:    Reason Eval/Treat Not Completed: Patient at procedure or test/ unavailable (pt in surgery).  Evern Bio 11/05/2022, 10:34 AM Berna Spare, OTR/L Acute Rehabilitation Services Office: [phone]

## 2022-11-05 NOTE — Op Note (Signed)
11/05/2022  12:42 PM  PATIENT:  Kelli Hudson    PRE-OPERATIVE DIAGNOSIS:  Cellulitis Bilateral Thighs and bilateral With effusion left knee POST-OPERATIVE DIAGNOSIS:  Same  PROCEDURE:  BILATERAL THIGH DEBRIDEMENT x 4. Bilateral calf debridement x 2. Application Kerecis micro graft 95 cm x 2. Application vancomycin powder 1 g x 3. Cultures obtained x 5. Culture left knee aspirate. Culture left thigh medial and lateral sent as separate cultures. Cultures right thigh medial and lateral sent as separate cultures. Application of cleanse choice wound VAC sponges total of 3 sponges and application of customizable wound VAC with 2 wound vacs.  SURGEON:  Nadara Mustard, MD  PHYSICIAN ASSISTANT:None ANESTHESIA:   General  PREOPERATIVE INDICATIONS:  Kelli Hudson is a  32 y.o. female with a diagnosis of Cellulitis Bilateral Thighs who failed conservative measures and elected for surgical management.    The risks benefits and alternatives were discussed with the patient preoperatively including but not limited to the risks of infection, bleeding, nerve injury, cardiopulmonary complications, the need for revision surgery, among others, and the patient was willing to proceed.  OPERATIVE IMPLANTS:   Implant Name Type Inv. Item Serial No. Manufacturer Lot No. LRB No. Used Action  GRAFT SKIN WND SURGICLOSE M95 - [id number] Tissue GRAFT SKIN WND [employer]  [employer] [id number] Left 1 Implanted  GRAFT SKIN WND SURGICLOSE M95 - [id number] Tissue GRAFT SKIN WND [employer]  [employer] [id number] Right 1 Implanted    @ENCIMAGES @  OPERATIVE FINDINGS: Patient had a large hematoma in the right calf and right thigh.  This was debrided back to healthy tissue.  The remaining areas of effusions showed clear serous fluid.  The knee aspiration had clear fluid.  OPERATIVE PROCEDURE: Patient brought the operating room and underwent a general anesthetic.  After adequate levels  anesthesia obtained both lower extremities were prepped using DuraPrep draped into a sterile field a timeout was called.  Attention was first focused on the left lower extremity.  Patient had a recurrent effusion of the left knee.  A new superior lateral portal was established and a trocar was inserted into the joint and the knee was aspirated approximately 50 cc of clear synovial fluid.  This was sent for cultures.  The wound was left open to drain.  Patient then underwent surgical incision of the medial thigh lateral thigh and medial leg.  There was clear lymphatic fluid fascia, muscle and fluid was sent for cultures separately.  The wounds were irrigated with Vashe irrigation the wounds were then filled with a total of 95 cm of Kerecis micro graft and a total of 1 g vancomycin powder.  The wounds were then reapproximated over cleanse choice wound VAC sponges x 2.  This was sealed with Ioban and derma tack and the 2 sponges were length with a customizable wound VAC this was also secured with Ioban and this had a good suction fit.  Attention was then focused in the right lower extremity.  A large incision was made medially from the medial thigh to the medial leg there was a large hematoma this was sent for cultures this was debrided back to healthy viable tissue primarily hematoma in the medial wound.  This was irrigated with Vashe irrigation.  Attention was then focused laterally and a lateral thigh incision was made this had clear lymphatic fluid this was also sent with cultures with tissue and sent lymphatic fluid.  The medial thigh hematoma was also set separately as a culture.  Both  wounds were irrigated with Vashe irrigation and total of 95 cm of Kerecis micro graft and 2 g of vancomycin powder was used to cover the wound beds.  The wounds were then filled with the cleanse choice wound VAC sponges the skin was loosely closed over the wound VAC sponges this was sealed with Ioban and the 2 sponges were linked  together with a customizable wound VAC.  This was again sealed with Ioban and derma tack this had a good suction fit patient was extubated taken the PACU in stable condition.   DISCHARGE PLANNING:  Antibiotic duration: Continue antibiotics adjust according to culture sensitivities  Weightbearing: Transfers  Pain medication: Continue opioid pathway  Dressing care/ Wound VAC: Continue wound VAC  Ambulatory devices: Transfer training  Patient will need to return to the operating room for repeat debridement and closure.  This may be Friday or next week depending on drainage from the wound vacs.

## 2022-11-06 ENCOUNTER — Encounter (HOSPITAL_COMMUNITY): Payer: Self-pay | Admitting: Orthopedic Surgery

## 2022-11-06 DIAGNOSIS — M726 Necrotizing fasciitis: Secondary | ICD-10-CM | POA: Diagnosis not present

## 2022-11-06 LAB — HEMOGLOBIN AND HEMATOCRIT, BLOOD
HCT: 24.8 % — ABNORMAL LOW (ref 36.0–46.0)
Hemoglobin: 8.1 g/dL — ABNORMAL LOW (ref 12.0–15.0)

## 2022-11-06 LAB — GLUCOSE, CAPILLARY
Glucose-Capillary: 95 mg/dL (ref 70–99)
Glucose-Capillary: 242 mg/dL — ABNORMAL HIGH (ref 70–99)
Glucose-Capillary: 148 mg/dL — ABNORMAL HIGH (ref 70–99)
Glucose-Capillary: 167 mg/dL — ABNORMAL HIGH (ref 70–99)
Glucose-Capillary: 138 mg/dL — ABNORMAL HIGH (ref 70–99)

## 2022-11-06 LAB — CBC
HCT: 20.3 % — ABNORMAL LOW (ref 36.0–46.0)
Hemoglobin: 6.4 g/dL — CL (ref 12.0–15.0)
MCH: 30.5 pg (ref 26.0–34.0)
MCHC: 31.5 g/dL (ref 30.0–36.0)
MCV: 96.7 fL (ref 80.0–100.0)
Platelets: 496 10*3/uL — ABNORMAL HIGH (ref 150–400)
RBC: 2.1 MIL/uL — ABNORMAL LOW (ref 3.87–5.11)
RDW: 14.5 % (ref 11.5–15.5)
WBC: 39.2 10*3/uL — ABNORMAL HIGH (ref 4.0–10.5)
nRBC: 0 % (ref 0.0–0.2)

## 2022-11-06 LAB — PREPARE RBC (CROSSMATCH)

## 2022-11-06 MED ORDER — FENTANYL CITRATE PF 50 MCG/ML IJ SOSY
25.0000 ug | PREFILLED_SYRINGE | Freq: Once | INTRAMUSCULAR | Status: AC
  Administered 2022-11-06: 25 ug via INTRAVENOUS
  Filled 2022-11-06: qty 1

## 2022-11-06 MED ORDER — SODIUM CHLORIDE 0.9% IV SOLUTION: Freq: Once | INTRAVENOUS | Status: AC

## 2022-11-06 MED ORDER — SODIUM CHLORIDE 0.9 % IV SOLN
2.0000 g | Freq: Three times a day (TID) | INTRAVENOUS | Status: DC
  Administered 2022-11-06 – 2022-11-08 (×5): 2 g via INTRAVENOUS
  Filled 2022-11-06 (×5): qty 12.5

## 2022-11-06 MED ORDER — SODIUM CHLORIDE 0.9 % IV BOLUS
500.0000 mL | Freq: Once | INTRAVENOUS | Status: AC
  Administered 2022-11-06: 500 mL via INTRAVENOUS

## 2022-11-06 MED ORDER — HYDROMORPHONE HCL 1 MG/ML IJ SOLN
0.5000 mg | INTRAMUSCULAR | Status: DC | PRN
  Administered 2022-11-06 – 2022-11-13 (×32): 0.5 mg via INTRAVENOUS
  Filled 2022-11-06 (×34): qty 0.5

## 2022-11-06 MED ORDER — METRONIDAZOLE 500 MG/100ML IV SOLN
500.0000 mg | Freq: Two times a day (BID) | INTRAVENOUS | Status: DC
  Administered 2022-11-06 – 2022-11-08 (×5): 500 mg via INTRAVENOUS
  Filled 2022-11-06 (×5): qty 100

## 2022-11-06 MED ORDER — IBUPROFEN 400 MG PO TABS
400.0000 mg | ORAL_TABLET | Freq: Once | ORAL | Status: AC
  Administered 2022-11-06: 400 mg via ORAL
  Filled 2022-11-06: qty 1

## 2022-11-06 MED ORDER — SODIUM CHLORIDE 0.9 % IV BOLUS
1000.0000 mL | Freq: Once | INTRAVENOUS | Status: AC
  Administered 2022-11-06: 1000 mL via INTRAVENOUS

## 2022-11-06 MED ORDER — VANCOMYCIN HCL 1500 MG/300ML IV SOLN
1500.0000 mg | INTRAVENOUS | Status: DC
  Administered 2022-11-06 – 2022-11-07 (×2): 1500 mg via INTRAVENOUS
  Filled 2022-11-06 (×3): qty 300

## 2022-11-06 MED ORDER — MIDODRINE HCL 5 MG PO TABS
5.0000 mg | ORAL_TABLET | Freq: Three times a day (TID) | ORAL | Status: DC
  Administered 2022-11-06 – 2022-11-18 (×30): 5 mg via ORAL
  Filled 2022-11-06 (×33): qty 1

## 2022-11-06 MED ORDER — INSULIN GLARGINE-YFGN 100 UNIT/ML ~~LOC~~ SOLN
4.0000 [IU] | Freq: Once | SUBCUTANEOUS | Status: AC
  Administered 2022-11-06: 4 [IU] via SUBCUTANEOUS
  Filled 2022-11-06: qty 0.04

## 2022-11-06 MED ORDER — SODIUM CHLORIDE 0.9% IV SOLUTION: Freq: Once | INTRAVENOUS | Status: DC

## 2022-11-06 NOTE — Progress Notes (Signed)
Patient ID: Kelli Hudson, female   DOB: 11-25-1990, 32 y.o.   MRN: [id number] Patient is status post repeat irrigation debridement of both legs.  Patient is having serosanguineous drainage.  Will plan for repeat debridement tomorrow.  Orders are placed for total of 2 units of packed red blood cells transfused today in anticipation of surgery tomorrow.

## 2022-11-06 NOTE — Progress Notes (Signed)
Patient Name: Kelli Hudson           DOB: [birth year]  MRN: [id number]      Admission Date: 10/03/2022  Attending Provider: Miguel Rota, MD  Primary Diagnosis: Necrotizing fasciitis [facility])   Level of care: Med-Surg    CROSS COVER NOTE   Date of Service   11/06/2022   [name], 32 y.o. female, was admitted on 10/03/2022 for Necrotizing fasciitis (HCC).    HPI/Events of Note   Hypotensive, 85/45 Asymptomatic.  Afebrile.  All other vital stable.  Recent hemoglobin 6.4.  Orders placed for 1 unit PRBC have not been started.  RN is calling lab for STAT type and screen. 500 cc fluid bolus added, nursing staff was asked to hold off on Dilaudid given low BP.  A one-time order of 25 mcg IV fentanyl was instead ordered.   Interventions/ Plan   Fluid bolus, 500 cc IV fentanyl, 25 mcg x 1 Nursing staff to reassess BP after bolus.  Report back if still low.        Anthoney Harada, DNP, ACNPC- AG [facility]

## 2022-11-06 NOTE — Progress Notes (Signed)
PROGRESS NOTE    Kelli Hudson  OZH:[id number] DOB: 01-Apr-1990 DOA: 10/03/2022 PCP: Norm Salt, PA    Brief Narrative:  32 y.o. female with medical history significant of DM1, diabetic gastroparesis with very frequent admission in the past, HTN presented w/ c/o B foot wounds, fever, and pain and informed that she has been dealing with lower extremity wound for past 2 weeks ,followed with her podiatrist in the outpatient setting at [facility] there which she recently finished a course of doxycycline which did not help her symptoms, initially had a blister that ruptured subsequently with redness streaking up her legs and noticing fever chills worsening pain in last few days.  Patient admitted for sepsis due to cellulitis/lower extremity wound, severe anemia, AKI. Podiatry consulted- Unable to complete MRI as gastric pacemaker is not MRI safe.  ID and Ortho were consulted.  Patient went to the OR for bilateral lower extremity necrotizing fasciitis feet to his thigh, remained intubated and required PRBC and FFP transfusion during the case on 8/21.  Eventually extubated on 8/23 and was taken back to the OR on 8/26 for wound VAC change.  Patient has remained on IV antibiotics.  Also underwent bilateral lower extremity BKA by Dr. Lajoyce Corners on 9/6. Since her surgery intermittently having anemia without any obvious signs of blood loss requiring PRBC transfusion, WBC still remains elevated but stable, no antibiotics per ID.  Repeat CT showed persistent fluid collection, plans for OR by Dr. Lajoyce Corners on 9/18.   Assessment & Plan:   Principal Problem:   Necrotizing fasciitis (HCC) Active Problems:   AKI (acute kidney injury) (HCC)   Sepsis due to cellulitis (HCC)   Cellulitis of lower extremity   Anemia   Diabetic gastroparesis associated with type 1 diabetes mellitus (HCC)   DM type 1 (diabetes mellitus, type 1) (HCC)   Drug-seeking behavior   Sepsis (HCC)   Necrotizing fasciitis of  pelvic region and thigh (HCC)   Necrotizing fasciitis of lower leg (HCC)   MDD (major depressive disorder), recurrent episode, moderate (HCC)   Severe sepsis due to bilateral feet abscess, necrotizing fasciitis, POA.  MRSE, sensitive Pseudomonas S/p bilateral transtibial amputation Initially underwent I&D on 8/21 with wound VAC change on 8/26 but ultimately required bilateral BKA by orthopedic on 9/6.  Repeat CT showed some changes with fluids collection therefore went back to the OR on 9/18 by Dr Lajoyce Corners for further I&D, repeat cultures sent.  Plans for repeat debridement tomorrow 9/20.  Holding off on antibiotics per ID and Ortho but low threshold to start.  Hypotension with tachycardia - Suspect from dehydration, overnight required fluid bolus, will order another liter this morning.  Discontinue Lasix.  Midodrine 5 mg 3 times daily   Acute blood loss anemia due to operative blood loss IV iron and multiple units of PRBC during her stay.   Type 1 diabetes, poorly controlled with intermittent hypoglycemia Continue current sliding scale and Accu-Chek.  Adjust as necessary   Acute kidney injury on CKD stage IIIa due to septic ATN, improving Non-anion gap metabolic acidosis Hyponatremia, euvolemic Resolved now back to baseline creatinine 1.2.  Peaked at 2.54   Severe protein calorie malnutrition Cortrak removed 9/9.  Encouraging oral diet, dietitian following.  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 \  Mild hyperkalemia - Stable  Hypomagnesemia - PRN repletion   PT/OT-SNF, per TOC we have insurance authorization until 9/29 Prior to discharge remove right upper extremity PICC line if no longer further requires it.   DVT prophylaxis: SCD   Code Status: Full Code  Family Communication: Mom up to date   Status is: Inpatient Remains inpatient appropriate because: Cont hosp stay.  Low threshold to upgrade level of care.           Subjective:  Sitting comfortably when I entered the room, therefore started to ask me about IV pain medss Her BP was soft this morning.    Examination:  General exam: Appears calm and comfortable  Respiratory system: Clear to auscultation. Respiratory effort normal. Cardiovascular system: S1 & S2 heard, RRR. No JVD, murmurs, rubs, gallops or clicks. No pedal edema. Gastrointestinal system: Abdomen is nondistended, soft and nontender. No organomegaly or masses felt. Normal bowel sounds heard. Central nervous system: Alert and oriented. No focal neurological deficits. Extremities: b/l BKA Skin: No rashes, lesions or ulcers Psychiatry: Judgement and insight appear normal. Mood & affect appropriate.   Pressure Injury 10/07/22 Elbow Posterior;Right Stage 2 -  Partial thickness loss of dermis presenting as a shallow open injury with a red, pink wound bed without slough. (Active)  10/07/22 1226  Location: Elbow  Location Orientation: Posterior;Right  Staging: Stage 2 -  Partial thickness loss of dermis presenting as a shallow open injury with a red, pink wound bed without slough.  Wound Description (Comments):   Present on Admission:      Diet Orders (From admission, onward)     Start     Ordered   11/05/22 1411  Diet Carb Modified Fluid consistency: Thin; Room service appropriate? Yes  Diet effective now       Question Answer Comment  Calorie Level Medium 1600-2000   Fluid consistency: Thin   Room service appropriate? Yes      11/05/22 1410            Objective: Vitals:   11/06/22 0919 11/06/22 0934 11/06/22 0949 11/06/22 0957  BP: 108/71 (!) 90/53 (!) 85/51 (!) 93/55  Pulse:  98  92  Resp: 10 12 (!) 9 13  Temp:  (!) 97.5 F (36.4 C)  (!) 97.4 F (36.3 C)  TempSrc:  Oral  Axillary  SpO2:      Weight:      Height:         Intake/Output Summary (Last 24 hours) at 11/06/2022 1030 Last data filed at 11/06/2022 0934 Gross per 24 hour  Intake 792 ml  Output 1530 ml  Net -738 ml   Filed Weights   11/03/22 0332 11/05/22 1031 11/06/22 0500  Weight: 104.2 kg 104.2 kg 103.2 kg    Scheduled Meds:  sodium chloride   Intravenous Once   sodium chloride   Intravenous Once   acetaminophen  1,000 mg Oral Q6H   vitamin C  1,000 mg Oral Daily   Chlorhexidine Gluconate Cloth  6 each Topical Daily   Chlorhexidine Gluconate Cloth  6 each Topical Q0600   docusate sodium  100 mg Oral BID   DULoxetine  30 mg Oral Daily   folic acid  1 mg Oral Daily   gabapentin  300 mg Oral Q8H   heparin injection (subcutaneous)  5,000 Units Subcutaneous Q8H   hydrocortisone cream   Topical TID   insulin aspart  0-5 Units Subcutaneous QHS   insulin aspart  0-9 Units Subcutaneous TID WC   methocarbamol  1,000 mg Oral TID   metoCLOPramide  10 mg Oral Q8H   metoprolol tartrate  25 mg Oral BID   midodrine  5 mg Oral TID WC   multivitamin with minerals  1 tablet Oral Daily   nutrition supplement (JUVEN)  1 packet Oral BID BM   omeprazole  40 mg Oral Daily   mouth rinse  15 mL Mouth Rinse 4 times per day   silver nitrate applicators  1 Application Topical Once   sodium chloride flush  10-40 mL Intracatheter Q12H   zinc sulfate  220 mg Oral Daily   Continuous Infusions:  sodium chloride 10 mL/hr at 11/01/22 2000   sodium chloride 10 mL/hr at 11/05/22 1549    ceFAZolin (ANCEF) IV 2 g (11/06/22 0525)   magnesium sulfate bolus IVPB     methocarbamol (ROBAXIN) IV      Nutritional status Signs/Symptoms: estimated needs Interventions: Juven, Glucerna shake Body mass index is 35.63 kg/m.  Data Reviewed:   CBC: Recent Labs  Lab 11/02/22 0413 11/03/22 0329 11/04/22 1021 11/05/22 0400 11/06/22 0221  WBC 34.8* 33.3* 40.6* 44.9* 39.2*  HGB 7.0* 8.0* 8.5* 7.5* 6.4*  HCT 22.0* 25.3* 27.0* 24.6* 20.3*  MCV 92.8 92.3 93.8 94.3  96.7  PLT 576* 572* 643* 572* 496*   Basic Metabolic Panel: Recent Labs  Lab 10/31/22 0304 10/31/22 2354 11/02/22 0413 11/03/22 0329 11/04/22 1021  NA 134* 132* 131* 131* 131*  K 5.4* 5.0 4.8 4.5 4.6  CL 101 102 102 103 104  CO2 22 21* 21* 21* 20*  GLUCOSE 243* 121* 103* 118* 118*  BUN 27* 31* 39* 41* 33*  CREATININE 1.34* 1.31* 1.30* 1.26* 1.13*  CALCIUM 7.4* 7.3* 7.5* 7.4* 7.3*  MG 1.6* 2.0 1.8 1.8 1.6*   GFR: Estimated Creatinine Clearance: 89.1 mL/min (A) (by C-G formula based on SCr of 1.13 mg/dL (H)). Liver Function Tests: No results for input(s): "AST", "ALT", "ALKPHOS", "BILITOT", "PROT", "ALBUMIN" in the last 168 hours. No results for input(s): "LIPASE", "AMYLASE" in the last 168 hours. No results for input(s): "AMMONIA" in the last 168 hours. Coagulation Profile: No results for input(s): "INR", "PROTIME" in the last 168 hours. Cardiac Enzymes: No results for input(s): "CKTOTAL", "CKMB", "CKMBINDEX", "TROPONINI" in the last 168 hours. BNP (last 3 results) No results for input(s): "PROBNP" in the last 8760 hours. HbA1C: No results for input(s): "HGBA1C" in the last 72 hours. CBG: Recent Labs  Lab 11/05/22 0729 11/05/22 1248 11/05/22 1659 11/05/22 1940 11/06/22 0736  GLUCAP 104* 95 132* 172* 242*   Lipid Profile: No results for input(s): "CHOL", "HDL", "LDLCALC", "TRIG", "CHOLHDL", "LDLDIRECT" in the last 72 hours. Thyroid Function Tests: No results for input(s): "TSH", "T4TOTAL", "FREET4", "T3FREE", "THYROIDAB" in the last 72 hours. Anemia Panel: No results for input(s): "VITAMINB12", "FOLATE", "FERRITIN", "TIBC", "IRON", "RETICCTPCT" in the last 72 hours. Sepsis Labs: No results for input(s): "PROCALCITON", "LATICACIDVEN" in the last 168 hours.  Recent Results (from the past 240 hour(s))  MRSA Next Gen by PCR, Nasal     Status: None   Collection Time: 11/05/22  6:22 AM   Specimen: Nasal Mucosa; Nasal Swab  Result Value Ref Range Status   MRSA by PCR  Next Gen NOT DETECTED NOT DETECTED Final    Comment: (NOTE) The GeneXpert MRSA Assay (FDA approved for NASAL specimens only), is one component of a comprehensive MRSA colonization surveillance program. It is not intended to diagnose MRSA infection nor to guide or monitor treatment for MRSA infections. Test  performance is not FDA approved in patients less than 74 years old. Performed at [facility], [address] [place], [place] [address]   Aerobic/Anaerobic Culture w Gram Stain (surgical/deep wound)     Status: None (Preliminary result)   Collection Time: 11/05/22 11:46 AM   Specimen: PATH Cytology Misc. fluid; Body Fluid  Result Value Ref Range Status   Specimen Description FLUID LEFT KNEE EFFUSION  Final   Special Requests A  Final   Gram Stain   Final    ABUNDANT WBC PRESENT, PREDOMINANTLY PMN NO ORGANISMS SEEN    Culture   Final    NO GROWTH < 24 HOURS Performed at [facility], [address] [place], [place] [address]    Report Status PENDING  Incomplete  Aerobic/Anaerobic Culture w Gram Stain (surgical/deep wound)     Status: None (Preliminary result)   Collection Time: 11/05/22 11:49 AM   Specimen: Thigh; Wound  Result Value Ref Range Status   Specimen Description THIGH LEFT LATERAL  Final   Special Requests B  Final   Gram Stain   Final    ABUNDANT WBC PRESENT, PREDOMINANTLY PMN NO ORGANISMS SEEN    Culture   Final    NO GROWTH < 24 HOURS Performed at [facility], [address] [place], [place] [address]    Report Status PENDING  Incomplete  Aerobic/Anaerobic Culture w Gram Stain (surgical/deep wound)     Status: None (Preliminary result)   Collection Time: 11/05/22 11:51 AM   Specimen: Thigh; Wound  Result Value Ref Range Status   Specimen Description TISSUE THIGH LEFT MEDIAL  Final   Special Requests TISSUE C  Final   Gram Stain   Final    ABUNDANT WBC PRESENT, PREDOMINANTLY PMN NO ORGANISMS SEEN    Culture   Final    NO GROWTH <  24 HOURS Performed at [facility], [address], [place], [place] [address]    Report Status PENDING  Incomplete  Aerobic/Anaerobic Culture w Gram Stain (surgical/deep wound)     Status: None (Preliminary result)   Collection Time: 11/05/22 11:55 AM   Specimen: Thigh; Wound  Result Value Ref Range Status   Specimen Description THIGH RIGHT MEDIAL  Final   Special Requests DIALYSIS  Final   Gram Stain   Final    ABUNDANT WBC PRESENT, PREDOMINANTLY PMN NO ORGANISMS SEEN    Culture   Final    CULTURE REINCUBATED FOR BETTER GROWTH Performed at [facility], [address], [place], [place] [address]    Report Status PENDING  Incomplete  Aerobic/Anaerobic Culture w Gram Stain (surgical/deep wound)     Status: None (Preliminary result)   Collection Time: 11/05/22 11:56 AM   Specimen: Thigh; Wound  Result Value Ref Range Status   Specimen Description THIGH RIGHT LATERAL  Final   Special Requests THIGH E  Final   Gram Stain   Final    ABUNDANT WBC PRESENT, PREDOMINANTLY PMN NO ORGANISMS SEEN    Culture   Final    NO GROWTH < 24 HOURS Performed at [facility], [address] [place], [place] [address]    Report Status PENDING  Incomplete         Radiology Studies: No results found.         LOS: 34 days   Time spent= 35 mins    Maire Govan Zoila Shutter, MD [facility]  If 7PM-7AM, please contact night-coverage  11/06/2022, 10:30 AM

## 2022-11-06 NOTE — Progress Notes (Signed)
Spiking fever, tachycardia. Tylenol given, will order cultures and start Vanc, cefepime and Flagyl.

## 2022-11-06 NOTE — Progress Notes (Signed)
Date and time results received: 11/06/22 0230  Test: hgb  Critical Value: 6.4  Name of Provider Notified: A.Virgel Manifold, NP  Orders Received?: Transfuse 1 PRBC

## 2022-11-06 NOTE — Progress Notes (Signed)
Patient Name: Nathali Totino           DOB: 1990/11/02  MRN: [id number]      Admission Date: 10/03/2022  Attending Provider: Miguel Rota, MD  Primary Diagnosis: Necrotizing fasciitis [facility])   Level of care: Med-Surg    CROSS COVER NOTE   Date of Service   11/06/2022   [name], 32 y.o. female, was admitted on 10/03/2022 for Necrotizing fasciitis (HCC).    HPI/Events of Note   Acute Blood Loss Anemia, orthopedic procedures Has previously required PRBC, IV iron Hemoglobin 7.5 -->  6.4.  No acute changes reported.  Hemodynamically stable. Orthopedics took patient to OR on 9/18 for bilateral thigh debridement.  Tonight, wound VAC output is bright red.    Interventions/ Plan   Blood transfusion, 1 unit PRBC        Anthoney Harada, DNP, [employer]- AG [facility]

## 2022-11-06 NOTE — Progress Notes (Signed)
OT Cancellation Note  Patient Details Name: Kelli Hudson MRN: [id number] DOB: [birth year]   Cancelled Treatment:    Reason Eval/Treat Not Completed: Medical issues which prohibited therapy. Pt receiving blood transfusion and with nausea and vomiting with minimal activity. Will continue to follow.  Evern Bio 11/06/2022, 12:43 PM Berna Spare, OTR/L Acute Rehabilitation Services Office: [phone]

## 2022-11-06 NOTE — H&P (View-Only) (Signed)
Patient ID: Kelli Hudson, female   DOB: 03-07-1990, 32 y.o.   MRN: [id number] Patient is status post repeat irrigation debridement of both legs.  Patient is having serosanguineous drainage.  Will plan for repeat debridement tomorrow.  Orders are placed for total of 2 units of packed red blood cells transfused today in anticipation of surgery tomorrow.

## 2022-11-06 NOTE — Progress Notes (Signed)
PT Cancellation Note  Patient Details Name: Kelli Hudson MRN: [id number] DOB: 03-11-1990   Cancelled Treatment:    Reason Eval/Treat Not Completed: (P) Medical issues which prohibited therapy Pt is receiving blood transfusion and is nauseous. PT will follow back tomorrow.  Ayssa Bentivegna B. Beverely Risen PT, DPT Acute Rehabilitation Services Please use secure chat or  Call Office [phone]     Elon Alas [facility] 11/06/2022, 12:06 PM

## 2022-11-06 NOTE — Plan of Care (Signed)
  Problem: Education: Goal: Knowledge of General Education information will improve Description: Including pain rating scale, medication(s)/side effects and non-pharmacologic comfort measures Outcome: Progressing   Problem: Health Behavior/Discharge Planning: Goal: Ability to manage health-related needs will improve Outcome: Progressing   Problem: Coping: Goal: Level of anxiety will decrease Outcome: Progressing   Problem: Pain Managment: Goal: General experience of comfort will improve Outcome: Not Progressing   Problem: Safety: Goal: Ability to remain free from injury will improve Outcome: Progressing

## 2022-11-06 NOTE — Progress Notes (Signed)
MEWS Progress Note  Patient Details Name: Ernesha Scripture MRN: [id number] DOB: [birth year] Today's Date: 11/06/2022   MEWS Flowsheet Documentation:  Assess: MEWS Score Temp: 98.8 F (37.1 C) BP: 99/60 MAP (mmHg): (!) 63 Pulse Rate: (!) 101 ECG Heart Rate: (!) 107 Resp: 18 Level of Consciousness: Alert SpO2: 99 % O2 Device: Room Air Patient Activity (if Appropriate): In bed O2 Flow Rate (L/min): 0 L/min FiO2 (%): 30 % Assess: MEWS Score MEWS Temp: 0 MEWS Systolic: 1 MEWS Pulse: 1 MEWS RR: 0 MEWS LOC: 0 MEWS Score: 2 MEWS Score Color: Yellow Assess: SIRS CRITERIA SIRS Temperature : 0 SIRS Respirations : 0 SIRS Pulse: 1 SIRS WBC: 1 SIRS Score Sum : 2 SIRS Temperature : 1 SIRS Pulse: 1 SIRS Respirations : 0 SIRS WBC: 0 SIRS Score Sum : 2 Assess: if the MEWS score is Yellow or Red Were vital signs accurate and taken at a resting state?: Yes Does the patient meet 2 or more of the SIRS criteria?: No Does the patient have a confirmed or suspected source of infection?: Yes MEWS guidelines implemented : Yes, yellow Treat MEWS Interventions: Considered administering scheduled or prn medications/treatments as ordered Take Vital Signs Increase Vital Sign Frequency : Yellow: Q2hr x1, continue Q4hrs until patient remains green for 12hrs Escalate MEWS: Escalate: Yellow: Discuss with charge nurse and consider notifying provider and/or RRT Notify: Charge Nurse/RN Name of Charge Nurse/RN Notified: [employer] fast streamer Name/Title: Stephania Fragmin, MD Date Provider Notified: 11/06/22 Time Provider Notified: [phone] Method of Notification: Page Notification Reason: Other (Comment) (BP, HR) Provider response: See new orders Date of Provider Response: 11/06/22 Time of Provider Response: 0719      Gertie Exon C 11/06/2022, 7:27 AM

## 2022-11-06 NOTE — Anesthesia Postprocedure Evaluation (Signed)
Anesthesia Post Note  Patient: Network engineer  Procedure(s) Performed: BILATERAL THIGH DEBRIDEMENT (Bilateral: Leg Upper)     Patient location during evaluation: PACU Anesthesia Type: General Level of consciousness: awake Pain management: pain level controlled Vital Signs Assessment: post-procedure vital signs reviewed and stable Respiratory status: spontaneous breathing, nonlabored ventilation and respiratory function stable Cardiovascular status: blood pressure returned to baseline and stable Postop Assessment: no apparent nausea or vomiting Anesthetic complications: no   There were no known notable events for this encounter.  Last Vitals:  Vitals:   11/06/22 0510 11/06/22 0511  BP: (!) 98/57 (!) 105/56  Pulse:    Resp: 13 19  Temp:    SpO2:      Last Pain:  Vitals:   11/06/22 0511  TempSrc:   PainSc: 10-Worst pain ever                 Catheryn Bacon Kaylyne Axton

## 2022-11-06 NOTE — Progress Notes (Signed)
Pharmacy Antibiotic Note  Kelli Hudson is a 32 y.o. female admitted on 10/03/2022 with bilateral thigh cellulitis with L knee effusion s/p debridement.  Pharmacy has been consulted to broaden from Cefazolin to Vancomycin/Cefepime.  Plan: Cefepime 2g IV q8h Vancomycin 1500mg  IV q24h for estimated AUC 465 using SCr 1.13, Vd 0.5L/kg Check vancomycin levels at steady state, goal AUC 400-550 Follow up renal function, cultures as available, clinical progress, length of tx   Height: 5\' 7"  (170.2 cm) Weight: 103.2 kg (227 lb 8.2 oz) IBW/kg (Calculated) : 61.6  Temp (24hrs), Avg:98.5 F (36.9 C), Min:97.4 F (36.3 C), Max:101.1 F (38.4 C)  Recent Labs  Lab 10/31/22 0304 10/31/22 2354 11/02/22 0413 11/03/22 0329 11/04/22 1021 11/05/22 0400 11/06/22 0221  WBC 39.6* 44.6* 34.8* 33.3* 40.6* 44.9* 39.2*  CREATININE 1.34* 1.31* 1.30* 1.26* 1.13*  --   --     Estimated Creatinine Clearance: 89.1 mL/min (A) (by C-G formula based on SCr of 1.13 mg/dL (H)).    Allergies  Allergen Reactions   Ibuprofen Other (See Comments)    07/23/22 - Worsening kidney function after 3 doses of ibuprofen 400mg .  Creatinine increased from 1.3 to 2.  Creatinine returned to normal after stopping.   Lisinopril Cough, Other (See Comments), Itching and Rash    Breakout in rash    Outpt Doxy 8/8 x 10 days Cefepime 8/16 >9/1 Vancomycin 8/16 > 8/21 Flagyl 8/16>>9/1 Cefazolin 9/6 x 1 Zyvox 8/20 >> 8/24; 8/25 >9/9 Merrem 9/1 >9/9 Cefazolin 9/18 >> 9/19 Vancomycin 9/19 >> Cefepime 9/19 >> Flagyl 9/19 >>   8/16 Bcx: NGF 8/16 resp panel: negative 8/18 R Foot Wound: ngtd 8/21 R calf tissue: staph aureus, MRSE 8/21 L thigh: corynebacterium 8/21 R foot tissue: MSSA, PsA (pan-susceptible) 8/31 Cdif negative 8/29 Bcx: negative 9/6 R knee:NGF 9/18 Thigh: 9/18 L knee:   Thank you for allowing pharmacy to be a part of this patient's care.  Loralee Pacas, PharmD, BCPS 11/06/2022 5:16 PM  Please  check AMION for all [facility] phone numbers After 10:00 PM, call Main Pharmacy [phone]

## 2022-11-07 ENCOUNTER — Inpatient Hospital Stay (HOSPITAL_COMMUNITY): Payer: Medicaid Other | Admitting: Anesthesiology

## 2022-11-07 ENCOUNTER — Encounter (HOSPITAL_COMMUNITY): Payer: Self-pay | Admitting: Orthopedic Surgery

## 2022-11-07 ENCOUNTER — Encounter (HOSPITAL_COMMUNITY)
Admission: EM | Disposition: A | Discharge status: Skilled Nursing Facility | Payer: Self-pay | Source: Home / Self Care | Attending: Internal Medicine

## 2022-11-07 ENCOUNTER — Other Ambulatory Visit: Payer: Self-pay

## 2022-11-07 DIAGNOSIS — E039 Hypothyroidism, unspecified: Secondary | ICD-10-CM | POA: Diagnosis not present

## 2022-11-07 DIAGNOSIS — E785 Hyperlipidemia, unspecified: Secondary | ICD-10-CM

## 2022-11-07 DIAGNOSIS — I10 Essential (primary) hypertension: Secondary | ICD-10-CM | POA: Diagnosis not present

## 2022-11-07 DIAGNOSIS — R509 Fever, unspecified: Secondary | ICD-10-CM

## 2022-11-07 DIAGNOSIS — M726 Necrotizing fasciitis: Secondary | ICD-10-CM

## 2022-11-07 HISTORY — PX: I & D EXTREMITY: SHX5045

## 2022-11-07 LAB — GLUCOSE, CAPILLARY
Glucose-Capillary: 108 mg/dL — ABNORMAL HIGH (ref 70–99)
Glucose-Capillary: 102 mg/dL — ABNORMAL HIGH (ref 70–99)
Glucose-Capillary: 95 mg/dL (ref 70–99)
Glucose-Capillary: 97 mg/dL (ref 70–99)
Glucose-Capillary: 187 mg/dL — ABNORMAL HIGH (ref 70–99)

## 2022-11-07 LAB — CBC
HCT: 19.1 % — ABNORMAL LOW (ref 36.0–46.0)
Hemoglobin: 6.3 g/dL — CL (ref 12.0–15.0)
MCH: 30.3 pg (ref 26.0–34.0)
MCHC: 33 g/dL (ref 30.0–36.0)
MCV: 91.8 fL (ref 80.0–100.0)
Platelets: 408 10*3/uL — ABNORMAL HIGH (ref 150–400)
RBC: 2.08 MIL/uL — ABNORMAL LOW (ref 3.87–5.11)
RDW: 14.9 % (ref 11.5–15.5)
WBC: 44.6 10*3/uL — ABNORMAL HIGH (ref 4.0–10.5)
nRBC: 0 % (ref 0.0–0.2)

## 2022-11-07 LAB — PREPARE RBC (CROSSMATCH)

## 2022-11-07 LAB — BASIC METABOLIC PANEL
Anion gap: 0 — ABNORMAL LOW (ref 5–15)
BUN: 37 mg/dL — ABNORMAL HIGH (ref 6–20)
CO2: 18 mmol/L — ABNORMAL LOW (ref 22–32)
Calcium: 6.5 mg/dL — ABNORMAL LOW (ref 8.9–10.3)
Chloride: 114 mmol/L — ABNORMAL HIGH (ref 98–111)
Creatinine, Ser: 1.28 mg/dL — ABNORMAL HIGH (ref 0.44–1.00)
GFR, Estimated: 57 mL/min — ABNORMAL LOW (ref >60)
Glucose, Bld: 91 mg/dL (ref 70–99)
Potassium: 4 mmol/L (ref 3.5–5.1)
Sodium: 132 mmol/L — ABNORMAL LOW (ref 135–145)

## 2022-11-07 LAB — MAGNESIUM: Magnesium: 1.5 mg/dL — ABNORMAL LOW (ref 1.7–2.4)

## 2022-11-07 SURGERY — IRRIGATION AND DEBRIDEMENT EXTREMITY
Anesthesia: General | Site: Leg Upper | Laterality: Bilateral

## 2022-11-07 MED ORDER — 0.9 % SODIUM CHLORIDE (POUR BTL) OPTIME
TOPICAL | Status: DC | PRN
  Administered 2022-11-07: 1000 mL

## 2022-11-07 MED ORDER — VANCOMYCIN HCL 1 G IV SOLR
INTRAVENOUS | Status: DC | PRN
  Administered 2022-11-07: 2000 mg via TOPICAL
  Administered 2022-11-07: 4000 mg via TOPICAL

## 2022-11-07 MED ORDER — FENTANYL CITRATE (PF) 250 MCG/5ML IJ SOLN
INTRAMUSCULAR | Status: AC
  Filled 2022-11-07: qty 5

## 2022-11-07 MED ORDER — ACETAMINOPHEN 160 MG/5ML PO SOLN: 325.0000 mg | ORAL | Status: DC | PRN

## 2022-11-07 MED ORDER — PROPOFOL 10 MG/ML IV BOLUS
INTRAVENOUS | Status: AC
  Filled 2022-11-07: qty 20

## 2022-11-07 MED ORDER — ONDANSETRON HCL 4 MG/2ML IJ SOLN: 4.0000 mg | Freq: Four times a day (QID) | INTRAMUSCULAR | Status: DC | PRN

## 2022-11-07 MED ORDER — ROCURONIUM BROMIDE 10 MG/ML (PF) SYRINGE
PREFILLED_SYRINGE | INTRAVENOUS | Status: DC | PRN
  Administered 2022-11-07: 20 mg via INTRAVENOUS

## 2022-11-07 MED ORDER — VANCOMYCIN HCL 1000 MG IV SOLR
INTRAVENOUS | Status: AC
  Filled 2022-11-07: qty 40

## 2022-11-07 MED ORDER — ACETAMINOPHEN 10 MG/ML IV SOLN
INTRAVENOUS | Status: AC
  Filled 2022-11-07: qty 100

## 2022-11-07 MED ORDER — HYDROMORPHONE HCL 1 MG/ML IJ SOLN
INTRAMUSCULAR | Status: AC
  Administered 2022-11-07: 1 mg via INTRAVENOUS
  Filled 2022-11-07: qty 1

## 2022-11-07 MED ORDER — OXYCODONE HCL 5 MG PO TABS
ORAL_TABLET | ORAL | Status: AC
  Filled 2022-11-07: qty 1

## 2022-11-07 MED ORDER — PHENYLEPHRINE HCL-NACL 20-0.9 MG/250ML-% IV SOLN
INTRAVENOUS | Status: DC | PRN
  Administered 2022-11-07: 50 ug/min via INTRAVENOUS

## 2022-11-07 MED ORDER — ORAL CARE MOUTH RINSE: 15.0000 mL | Freq: Once | OROMUCOSAL | Status: AC

## 2022-11-07 MED ORDER — HYDROMORPHONE HCL 1 MG/ML IJ SOLN: 1.0000 mg | Freq: Once | INTRAMUSCULAR | Status: AC

## 2022-11-07 MED ORDER — ROCURONIUM BROMIDE 10 MG/ML (PF) SYRINGE
PREFILLED_SYRINGE | INTRAVENOUS | Status: AC
  Filled 2022-11-07: qty 10

## 2022-11-07 MED ORDER — DIPHENHYDRAMINE HCL 50 MG/ML IJ SOLN: 12.5000 mg | Freq: Four times a day (QID) | INTRAMUSCULAR | Status: DC | PRN

## 2022-11-07 MED ORDER — NEOSTIGMINE METHYLSULFATE 3 MG/3ML IV SOSY
PREFILLED_SYRINGE | INTRAVENOUS | Status: AC
  Filled 2022-11-07: qty 3

## 2022-11-07 MED ORDER — SODIUM CHLORIDE 0.9% FLUSH: 9.0000 mL | INTRAVENOUS | Status: DC | PRN

## 2022-11-07 MED ORDER — GLYCOPYRROLATE PF 0.2 MG/ML IJ SOSY
PREFILLED_SYRINGE | INTRAMUSCULAR | Status: DC | PRN
  Administered 2022-11-07: .3 mg via INTRAVENOUS

## 2022-11-07 MED ORDER — FENTANYL CITRATE (PF) 250 MCG/5ML IJ SOLN
INTRAMUSCULAR | Status: DC | PRN
  Administered 2022-11-07 (×2): 50 ug via INTRAVENOUS
  Administered 2022-11-07: 25 ug via INTRAVENOUS

## 2022-11-07 MED ORDER — VANCOMYCIN HCL 1000 MG IV SOLR
INTRAVENOUS | Status: AC
  Filled 2022-11-07: qty 80

## 2022-11-07 MED ORDER — NEOSTIGMINE METHYLSULFATE 3 MG/3ML IV SOSY
PREFILLED_SYRINGE | INTRAVENOUS | Status: DC | PRN
Start: 2022-11-07 — End: 2022-11-07
  Administered 2022-11-07: 3 mg via INTRAVENOUS

## 2022-11-07 MED ORDER — CHLORHEXIDINE GLUCONATE 0.12 % MT SOLN
15.0000 mL | Freq: Once | OROMUCOSAL | Status: AC
  Administered 2022-11-07: 15 mL via OROMUCOSAL

## 2022-11-07 MED ORDER — CHLORHEXIDINE GLUCONATE 4 % EX SOLN
60.0000 mL | Freq: Once | CUTANEOUS | Status: AC
  Administered 2022-11-07: 4 via TOPICAL
  Filled 2022-11-07: qty 60

## 2022-11-07 MED ORDER — ONDANSETRON HCL 4 MG/2ML IJ SOLN
INTRAMUSCULAR | Status: DC | PRN
  Administered 2022-11-07: 4 mg via INTRAVENOUS

## 2022-11-07 MED ORDER — HYDROMORPHONE HCL 1 MG/ML IJ SOLN
INTRAMUSCULAR | Status: AC
  Administered 2022-11-08: 0.5 mg via INTRAVENOUS
  Filled 2022-11-07: qty 1

## 2022-11-07 MED ORDER — OXYCODONE HCL 5 MG PO TABS
5.0000 mg | ORAL_TABLET | Freq: Once | ORAL | Status: AC | PRN
  Administered 2022-11-07: 5 mg via ORAL

## 2022-11-07 MED ORDER — ONDANSETRON HCL 4 MG/2ML IJ SOLN
INTRAMUSCULAR | Status: AC
  Filled 2022-11-07: qty 2

## 2022-11-07 MED ORDER — LIDOCAINE 2% (20 MG/ML) 5 ML SYRINGE
INTRAMUSCULAR | Status: AC
  Filled 2022-11-07: qty 5

## 2022-11-07 MED ORDER — ALBUMIN HUMAN 5 % IV SOLN
INTRAVENOUS | Status: DC | PRN
Start: 2022-11-07 — End: 2022-11-07

## 2022-11-07 MED ORDER — PROPOFOL 10 MG/ML IV BOLUS
INTRAVENOUS | Status: DC | PRN
Start: 2022-11-07 — End: 2022-11-07
  Administered 2022-11-07: 130 mg via INTRAVENOUS

## 2022-11-07 MED ORDER — GLYCOPYRROLATE PF 0.2 MG/ML IJ SOSY
PREFILLED_SYRINGE | INTRAMUSCULAR | Status: AC
  Filled 2022-11-07: qty 1

## 2022-11-07 MED ORDER — VASHE WOUND IRRIGATION OPTIME
TOPICAL | Status: DC | PRN
Start: 2022-11-07 — End: 2022-11-07
  Administered 2022-11-07 (×3): 34 [oz_av]

## 2022-11-07 MED ORDER — DIPHENHYDRAMINE HCL 12.5 MG/5ML PO ELIX: 12.5000 mg | ORAL_SOLUTION | Freq: Four times a day (QID) | ORAL | Status: DC | PRN

## 2022-11-07 MED ORDER — OXYCODONE HCL 5 MG/5ML PO SOLN: 5.0000 mg | Freq: Once | ORAL | Status: AC | PRN

## 2022-11-07 MED ORDER — FENTANYL CITRATE (PF) 100 MCG/2ML IJ SOLN: 25.0000 ug | INTRAMUSCULAR | Status: DC | PRN

## 2022-11-07 MED ORDER — MIDAZOLAM HCL 5 MG/5ML IJ SOLN
INTRAMUSCULAR | Status: DC | PRN
  Administered 2022-11-07: 1 mg via INTRAVENOUS

## 2022-11-07 MED ORDER — MEPERIDINE HCL 25 MG/ML IJ SOLN: 6.2500 mg | INTRAMUSCULAR | Status: DC | PRN

## 2022-11-07 MED ORDER — NALOXONE HCL 0.4 MG/ML IJ SOLN: 0.4000 mg | INTRAMUSCULAR | Status: DC | PRN

## 2022-11-07 MED ORDER — MIDAZOLAM HCL 2 MG/2ML IJ SOLN
INTRAMUSCULAR | Status: AC
  Filled 2022-11-07: qty 2

## 2022-11-07 MED ORDER — LIDOCAINE 2% (20 MG/ML) 5 ML SYRINGE
INTRAMUSCULAR | Status: DC | PRN
  Administered 2022-11-07: 60 mg via INTRAVENOUS

## 2022-11-07 MED ORDER — SODIUM CHLORIDE 0.9% IV SOLUTION: Freq: Once | INTRAVENOUS | Status: AC

## 2022-11-07 MED ORDER — CEFAZOLIN SODIUM-DEXTROSE 2-4 GM/100ML-% IV SOLN
2.0000 g | INTRAVENOUS | Status: AC
  Administered 2022-11-07: 2 g via INTRAVENOUS
  Filled 2022-11-07: qty 100

## 2022-11-07 MED ORDER — ACETAMINOPHEN 325 MG PO TABS: 325.0000 mg | ORAL_TABLET | ORAL | Status: DC | PRN

## 2022-11-07 MED ORDER — ACETAMINOPHEN 10 MG/ML IV SOLN
1000.0000 mg | Freq: Once | INTRAVENOUS | Status: AC
  Administered 2022-11-07: 1000 mg via INTRAVENOUS

## 2022-11-07 MED ORDER — LACTATED RINGERS IV SOLN: INTRAVENOUS | Status: DC

## 2022-11-07 MED ORDER — SIMETHICONE 80 MG PO CHEW
80.0000 mg | CHEWABLE_TABLET | Freq: Four times a day (QID) | ORAL | Status: DC | PRN
  Administered 2022-11-07: 80 mg via ORAL
  Filled 2022-11-07 (×2): qty 1

## 2022-11-07 SURGICAL SUPPLY — 51 items
BAG COUNTER SPONGE SURGICOUNT (BAG) IMPLANT
BAG SPNG CNTER NS LX DISP (BAG)
BLADE SAW RECIP 87.9 MT (BLADE) IMPLANT
BLADE SURG 21 STRL SS (BLADE) ×1 IMPLANT
BNDG CMPR 5X6 CHSV STRCH STRL (GAUZE/BANDAGES/DRESSINGS)
BNDG COHESIVE 6X5 TAN ST LF (GAUZE/BANDAGES/DRESSINGS) IMPLANT
BNDG GAUZE DERMACEA FLUFF 4 (GAUZE/BANDAGES/DRESSINGS) ×2 IMPLANT
BNDG GZE DERMACEA 4 6PLY (GAUZE/BANDAGES/DRESSINGS)
CANISTER WOUNDNEG PRESSURE 500 (CANNISTER) IMPLANT
CLEANSER WND VASHE INSTL 34OZ (WOUND CARE) IMPLANT
CNTNR URN SCR LID CUP LEK RST (MISCELLANEOUS) IMPLANT
CONT SPEC 4OZ STRL OR WHT (MISCELLANEOUS) ×1
COVER SURGICAL LIGHT HANDLE (MISCELLANEOUS) ×2 IMPLANT
DRAPE DERMATAC (DRAPES) IMPLANT
DRAPE INCISE IOBAN 66X45 STRL (DRAPES) IMPLANT
DRAPE U-SHAPE 47X51 STRL (DRAPES) ×1 IMPLANT
DRESSING PREVENA PLUS CUSTOM (GAUZE/BANDAGES/DRESSINGS) IMPLANT
DRESSING VERAFLO CLEANS CC MED (GAUZE/BANDAGES/DRESSINGS) IMPLANT
DRSG ADAPTIC 3X8 NADH LF (GAUZE/BANDAGES/DRESSINGS) ×1 IMPLANT
DRSG PREVENA PLUS CUSTOM (GAUZE/BANDAGES/DRESSINGS) ×2 IMPLANT
DRSG VERAFLO CLEANSE CC MED (GAUZE/BANDAGES/DRESSINGS) ×1 IMPLANT
DURAPREP 26ML APPLICATOR (WOUND CARE) ×1 IMPLANT
ELECT REM PT RETURN 9FT ADLT (ELECTROSURGICAL) ×1 IMPLANT
ELECTRODE REM PT RTRN 9FT ADLT (ELECTROSURGICAL) IMPLANT
FACESHIELD WRAPAROUND (MASK) ×1 IMPLANT
FACESHIELD WRAPAROUND OR TEAM (MASK) IMPLANT
GAUZE SPONGE 4X4 12PLY STRL (GAUZE/BANDAGES/DRESSINGS) ×1 IMPLANT
GLOVE BIOGEL PI IND STRL 9 (GLOVE) ×1 IMPLANT
GLOVE SURG ORTHO 9.0 STRL STRW (GLOVE) ×1 IMPLANT
GOWN STRL REUS W/ TWL XL LVL3 (GOWN DISPOSABLE) ×2 IMPLANT
GOWN STRL REUS W/TWL XL LVL3 (GOWN DISPOSABLE) ×2
GRAFT SKIN WND SURGICLOSE M95 (Tissue) IMPLANT
HANDPIECE INTERPULSE COAX TIP (DISPOSABLE)
KIT BASIN OR (CUSTOM PROCEDURE TRAY) ×1 IMPLANT
KIT TURNOVER KIT B (KITS) ×1 IMPLANT
MANIFOLD NEPTUNE II (INSTRUMENTS) ×1 IMPLANT
NS IRRIG 1000ML POUR BTL (IV SOLUTION) ×1 IMPLANT
PACK ORTHO EXTREMITY (CUSTOM PROCEDURE TRAY) ×1 IMPLANT
PAD ARMBOARD 7.5X6 YLW CONV (MISCELLANEOUS) ×2 IMPLANT
PAD NEG PRESSURE SENSATRAC (MISCELLANEOUS) IMPLANT
SET HNDPC FAN SPRY TIP SCT (DISPOSABLE) IMPLANT
SPONGE T-LAP 18X18 ~~LOC~~+RFID (SPONGE) IMPLANT
STOCKINETTE 6 STRL (DRAPES) IMPLANT
STOCKINETTE IMPERVIOUS 9X36 MD (GAUZE/BANDAGES/DRESSINGS) IMPLANT
SUT ETHILON 2 0 PSLX (SUTURE) ×1 IMPLANT
SUT SILK 2 0 TIES 10X30 (SUTURE) IMPLANT
SWAB COLLECTION DEVICE MRSA (MISCELLANEOUS) ×1 IMPLANT
SWAB CULTURE ESWAB REG 1ML (MISCELLANEOUS) IMPLANT
TOWEL GREEN STERILE (TOWEL DISPOSABLE) ×1 IMPLANT
TUBE CONNECTING 12X1/4 (SUCTIONS) ×1 IMPLANT
YANKAUER SUCT BULB TIP NO VENT (SUCTIONS) ×1 IMPLANT

## 2022-11-07 NOTE — Anesthesia Preprocedure Evaluation (Addendum)
Anesthesia Evaluation  Patient identified by MRN, date of birth, ID band Patient awake    Reviewed: Allergy & Precautions, NPO status , Patient's Chart, lab work & pertinent test results  Airway Mallampati: II  TM Distance: >3 FB Neck ROM: Full    Dental no notable dental hx.    Pulmonary neg pulmonary ROS   Pulmonary exam normal        Cardiovascular hypertension, Pt. on home beta blockers and Pt. on medications Normal cardiovascular exam     Neuro/Psych  PSYCHIATRIC DISORDERS Anxiety Depression     Neuromuscular disease    GI/Hepatic Neg liver ROS, PUD,GERD  Medicated,,  Endo/Other  diabetes, Type 1, Insulin Dependent    Renal/GU Renal InsufficiencyRenal disease     Musculoskeletal negative musculoskeletal ROS (+)    Abdominal  (+) + obese  Peds  Hematology  (+) Blood dyscrasia, anemia   Anesthesia Other Findings Cellulitis Bilateral Thighs  Reproductive/Obstetrics Hcg negative                             Anesthesia Physical Anesthesia Plan  ASA: 3  Anesthesia Plan: General   Post-op Pain Management:    Induction: Intravenous  PONV Risk Score and Plan: 3 and Dexamethasone and Treatment may vary due to age or medical condition  Airway Management Planned: Oral ETT  Additional Equipment: None  Intra-op Plan:   Post-operative Plan: Extubation in OR  Informed Consent: I have reviewed the patients History and Physical, chart, labs and discussed the procedure including the risks, benefits and alternatives for the proposed anesthesia with the patient or authorized representative who has indicated his/her understanding and acceptance.     Dental advisory given  Plan Discussed with: CRNA and Anesthesiologist  Anesthesia Plan Comments:          Anesthesia Quick Evaluation

## 2022-11-07 NOTE — Progress Notes (Addendum)
PROGRESS NOTE    Kelli Hudson  ZOX:[id number] DOB: 26-Jun-1990 DOA: 10/03/2022 PCP: Norm Salt, PA    Brief Narrative:  32 y.o. female with medical history significant of DM1, diabetic gastroparesis with very frequent admission in the past, HTN presented w/ c/o B foot wounds, fever, and pain and informed that she has been dealing with lower extremity wound for past 2 weeks ,followed with her podiatrist in the outpatient setting at [facility] there which she recently finished a course of doxycycline which did not help her symptoms, initially had a blister that ruptured subsequently with redness streaking up her legs and noticing fever chills worsening pain in last few days.  Patient admitted for sepsis due to cellulitis/lower extremity wound, severe anemia, AKI. Podiatry consulted- Unable to complete MRI as gastric pacemaker is not MRI safe.  ID and Ortho were consulted.  Patient went to the OR for bilateral lower extremity necrotizing fasciitis feet to his thigh, remained intubated and required PRBC and FFP transfusion during the case on 8/21.  Eventually extubated on 8/23 and was taken back to the OR on 8/26 for wound VAC change.  Patient has remained on IV antibiotics.  Also underwent bilateral lower extremity BKA by Dr. Lajoyce Corners on 9/6. Since her surgery intermittently having anemia without any obvious signs of blood loss requiring PRBC transfusion, WBC still remains elevated but stable, no antibiotics per ID.  Repeat CT showed persistent fluid collection, s/p OR 9/18, and again tomorrow.    Assessment & Plan:   Principal Problem:   Necrotizing fasciitis (HCC) Active Problems:   AKI (acute kidney injury) (HCC)   Sepsis due to cellulitis (HCC)   Cellulitis of lower extremity   Anemia   Diabetic gastroparesis associated with type 1 diabetes mellitus (HCC)   DM type 1 (diabetes mellitus, type 1) (HCC)   Drug-seeking behavior   Sepsis (HCC)   Necrotizing fasciitis of  pelvic region and thigh (HCC)   Necrotizing fasciitis of lower leg (HCC)   MDD (major depressive disorder), recurrent episode, moderate (HCC)   Severe sepsis due to bilateral feet abscess, necrotizing fasciitis, POA.  MRSE, sensitive Pseudomonas S/p bilateral transtibial amputation Initially underwent I&D on 8/21 with wound VAC change on 8/26 but ultimately required bilateral BKA by orthopedic on 9/6.  Repeat CT showed some changes with fluids collection therefore went back to the OR on 9/18 by Dr Lajoyce Corners for further I&D, repeat cultures sent.  Fever on 9/19, Back to the or again today for debridement, amputation revision. Several abscesses encountered. Blood and wound cultures are pending. ID continues to follow, advises line holiday but IV team says nowhere for peripheral access, so ID advises ok to continue picc will just monitor for fever closely and monitor blood cultures. Continuing vanc/cefepime/flagyl pending culture results. Continue pain control  Hypotension with tachycardia Stable today. continuing Midodrine 5 mg 3 times daily   Acute blood loss anemia due to operative blood loss IV iron and multiple units of PRBC during her stay. One unit today for surgical blood loss, repeat cbc pending   Type 1 diabetes, poorly controlled with intermittent hypoglycemia Controlled currently. Continue current sliding scale and Accu-Chek.  Adjust as necessary   Acute kidney injury on CKD stage IIIa due to septic ATN, improving Non-anion gap metabolic acidosis Hyponatremia, euvolemic Resolved now back to baseline creatinine ~1.2.  Peaked at 2.54   Severe protein calorie malnutrition Cortrak removed 9/9.  Encouraging oral diet, dietitian following.  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   Mild hyperkalemia -  Stable  Hypomagnesemia - PRN repletion   PT/OT-SNF, per TOC we have insurance authorization until 9/29    DVT prophylaxis: heparin   Code Status: Full Code  Family Communication: family updated @ bedside 9/20  Status is: Inpatient Remains inpatient appropriate because: Cont hosp stay.  Low threshold to upgrade level of care.           Subjective:  In some pain after surgery, tolerating diet, denies constipation   Examination:  General exam: Appears in some pain  Respiratory system: Clear to auscultation. Respiratory effort normal. Cardiovascular system: S1 & S2 heard, RRR. No JVD, murmurs, rubs, gallops or clicks. No pedal edema. Gastrointestinal system: Abdomen is nondistended, soft and nontender. No organomegaly or masses felt.   Central nervous system: Alert and oriented. No focal neurological deficits. Extremities: b/l BKA, wound vacs in place Skin: No rashes, lesions or ulcers Psychiatry: Judgement and insight appear normal. Mood & affect appropriate.   Pressure Injury 10/07/22 Elbow Posterior;Right Stage 2 -  Partial thickness loss of dermis presenting as a shallow open injury with a red, pink wound bed without slough. (Active)  10/07/22 1226  Location: Elbow  Location Orientation: Posterior;Right  Staging: Stage 2 -  Partial thickness loss of dermis presenting as a shallow open injury with a red, pink wound bed without slough.  Wound Description (Comments):   Present on Admission:      Diet Orders (From admission, onward)     Start     Ordered   11/07/22 1553  Diet Carb Modified Fluid consistency: Thin; Room service appropriate? Yes  Diet effective now       Question Answer Comment  Diet-HS Snack? Nothing   Calorie Level Medium 1600-2000   Fluid consistency: Thin   Room service appropriate? Yes      11/07/22 1552            Objective: Vitals:   11/07/22 1305 11/07/22 1315 11/07/22 1330 11/07/22 1341  BP: 113/66 102/82 123/83 123/78  Pulse:  75 78 87 85  Resp: 14 11 10 12   Temp: 97.9 F (36.6 C)   98 F (36.7 C)  TempSrc:    Oral  SpO2: 100% 100% 100% 100%  Weight:      Height:        Intake/Output Summary (Last 24 hours) at 11/07/2022 1553 Last data filed at 11/07/2022 1327 Gross per 24 hour  Intake 1265 ml  Output 1300 ml  Net -35 ml   Filed Weights   11/05/22 1031 11/06/22 0500 11/07/22 0321  Weight: 104.2 kg 103.2 kg 104.9 kg    Scheduled Meds:  sodium chloride   Intravenous Once   sodium chloride   Intravenous Once   acetaminophen  1,000 mg Oral Q6H   vitamin C  1,000 mg Oral Daily   Chlorhexidine Gluconate Cloth  6 each Topical Daily   Chlorhexidine Gluconate Cloth  6 each Topical Q0600   docusate sodium  100 mg Oral BID   DULoxetine  30 mg Oral Daily   folic acid  1 mg Oral Daily   gabapentin  300 mg Oral Q8H   heparin injection (subcutaneous)  5,000 Units Subcutaneous Q8H   hydrocortisone cream   Topical TID   insulin aspart  0-5 Units Subcutaneous QHS  insulin aspart  0-9 Units Subcutaneous TID WC   methocarbamol  1,000 mg Oral TID   metoCLOPramide  10 mg Oral Q8H   metoprolol tartrate  25 mg Oral BID   midodrine  5 mg Oral TID WC   multivitamin with minerals  1 tablet Oral Daily   nutrition supplement (JUVEN)  1 packet Oral BID BM   omeprazole  40 mg Oral Daily   mouth rinse  15 mL Mouth Rinse 4 times per day   oxyCODONE       silver nitrate applicators  1 Application Topical Once   sodium chloride flush  10-40 mL Intracatheter Q12H   Continuous Infusions:  sodium chloride 10 mL/hr at 11/06/22 1031   sodium chloride Stopped (11/06/22 0941)   acetaminophen     ceFEPime (MAXIPIME) IV 2 g (11/07/22 0300)   magnesium sulfate bolus IVPB     methocarbamol (ROBAXIN) IV     metronidazole 500 mg (11/07/22 0556)   vancomycin 1,500 mg (11/06/22 1842)    Nutritional status Signs/Symptoms: estimated needs Interventions: Juven, Glucerna shake Body mass index is 36.22 kg/m.  Data Reviewed:    CBC: Recent Labs  Lab 11/02/22 0413 11/03/22 0329 11/04/22 1021 11/05/22 0400 11/06/22 0221 11/06/22 1450  WBC 34.8* 33.3* 40.6* 44.9* 39.2*  --   HGB 7.0* 8.0* 8.5* 7.5* 6.4* 8.1*  HCT 22.0* 25.3* 27.0* 24.6* 20.3* 24.8*  MCV 92.8 92.3 93.8 94.3 96.7  --   PLT 576* 572* 643* 572* 496*  --    Basic Metabolic Panel: Recent Labs  Lab 10/31/22 2354 11/02/22 0413 11/03/22 0329 11/04/22 1021 11/07/22 0425  NA 132* 131* 131* 131* 132*  K 5.0 4.8 4.5 4.6 4.0  CL 102 102 103 104 114*  CO2 21* 21* 21* 20* 18*  GLUCOSE 121* 103* 118* 118* 91  BUN 31* 39* 41* 33* 37*  CREATININE 1.31* 1.30* 1.26* 1.13* 1.28*  CALCIUM 7.3* 7.5* 7.4* 7.3* 6.5*  MG 2.0 1.8 1.8 1.6*  --    GFR: Estimated Creatinine Clearance: 79.3 mL/min (A) (by C-G formula based on SCr of 1.28 mg/dL (H)). Liver Function Tests: No results for input(s): "AST", "ALT", "ALKPHOS", "BILITOT", "PROT", "ALBUMIN" in the last 168 hours. No results for input(s): "LIPASE", "AMYLASE" in the last 168 hours. No results for input(s): "AMMONIA" in the last 168 hours. Coagulation Profile: No results for input(s): "INR", "PROTIME" in the last 168 hours. Cardiac Enzymes: No results for input(s): "CKTOTAL", "CKMB", "CKMBINDEX", "TROPONINI" in the last 168 hours. BNP (last 3 results) No results for input(s): "PROBNP" in the last 8760 hours. HbA1C: No results for input(s): "HGBA1C" in the last 72 hours. CBG: Recent Labs  Lab 11/06/22 1652 11/06/22 2040 11/07/22 0614 11/07/22 1004 11/07/22 1310  GLUCAP 167* 138* 108* 102* 95   Lipid Profile: No results for input(s): "CHOL", "HDL", "LDLCALC", "TRIG", "CHOLHDL", "LDLDIRECT" in the last 72 hours. Thyroid Function Tests: No results for input(s): "TSH", "T4TOTAL", "FREET4", "T3FREE", "THYROIDAB" in the last 72 hours. Anemia Panel: No results for input(s): "VITAMINB12", "FOLATE", "FERRITIN", "TIBC", "IRON", "RETICCTPCT" in the last 72 hours. Sepsis Labs: No results for  input(s): "PROCALCITON", "LATICACIDVEN" in the last 168 hours.  Recent Results (from the past 240 hour(s))  MRSA Next Gen by PCR, Nasal     Status: None   Collection Time: 11/05/22  6:22 AM   Specimen: Nasal Mucosa; Nasal Swab  Result Value Ref Range Status   MRSA by PCR Next Gen NOT DETECTED NOT DETECTED Final  Comment: (NOTE) The GeneXpert MRSA Assay (FDA approved for NASAL specimens only), is one component of a comprehensive MRSA colonization surveillance program. It is not intended to diagnose MRSA infection nor to guide or monitor treatment for MRSA infections. Test performance is not FDA approved in patients less than 14 years old. Performed at [facility], [address], [place], [place] [address]   Aerobic/Anaerobic Culture w Gram Stain (surgical/deep wound)     Status: None (Preliminary result)   Collection Time: 11/05/22 11:46 AM   Specimen: PATH Cytology Misc. fluid; Body Fluid  Result Value Ref Range Status   Specimen Description FLUID LEFT KNEE EFFUSION  Final   Special Requests A  Final   Gram Stain   Final    ABUNDANT WBC PRESENT, PREDOMINANTLY PMN NO ORGANISMS SEEN    Culture   Final    NO GROWTH 2 DAYS NO ANAEROBES ISOLATED; CULTURE IN PROGRESS FOR 5 DAYS Performed at [facility], [address], [place], [place] [address]    Report Status PENDING  Incomplete  Aerobic/Anaerobic Culture w Gram Stain (surgical/deep wound)     Status: None (Preliminary result)   Collection Time: 11/05/22 11:49 AM   Specimen: Thigh; Wound  Result Value Ref Range Status   Specimen Description THIGH LEFT LATERAL  Final   Special Requests B  Final   Gram Stain   Final    ABUNDANT WBC PRESENT, PREDOMINANTLY PMN NO ORGANISMS SEEN Performed at [facility], [address] [place], [place] [address]    Culture   Final    CULTURE REINCUBATED FOR BETTER GROWTH NO ANAEROBES ISOLATED; CULTURE IN PROGRESS FOR 5 DAYS    Report Status PENDING  Incomplete   Aerobic/Anaerobic Culture w Gram Stain (surgical/deep wound)     Status: None (Preliminary result)   Collection Time: 11/05/22 11:51 AM   Specimen: Thigh; Wound  Result Value Ref Range Status   Specimen Description TISSUE THIGH LEFT MEDIAL  Final   Special Requests TISSUE C  Final   Gram Stain   Final    ABUNDANT WBC PRESENT, PREDOMINANTLY PMN NO ORGANISMS SEEN Performed at [facility], [address], [place], [place] [address]    Culture   Final    RARE STENOTROPHOMONAS MALTOPHILIA SUSCEPTIBILITIES TO FOLLOW NO ANAEROBES ISOLATED; CULTURE IN PROGRESS FOR 5 DAYS    Report Status PENDING  Incomplete  Aerobic/Anaerobic Culture w Gram Stain (surgical/deep wound)     Status: None (Preliminary result)   Collection Time: 11/05/22 11:55 AM   Specimen: Thigh; Wound  Result Value Ref Range Status   Specimen Description THIGH RIGHT MEDIAL  Final   Special Requests DIALYSIS  Final   Gram Stain   Final    ABUNDANT WBC PRESENT, PREDOMINANTLY PMN NO ORGANISMS SEEN    Culture   Final    FEW KLEBSIELLA PNEUMONIAE FEW STENOTROPHOMONAS MALTOPHILIA CULTURE REINCUBATED FOR BETTER GROWTH HOLDING FOR POSSIBLE ANAEROBE Performed at [facility], [address], [place], [place] [address]    Report Status PENDING  Incomplete  Aerobic/Anaerobic Culture w Gram Stain (surgical/deep wound)     Status: None (Preliminary result)   Collection Time: 11/05/22 11:56 AM   Specimen: Thigh; Wound  Result Value Ref Range Status   Specimen Description THIGH RIGHT LATERAL  Final   Special Requests THIGH E  Final   Gram Stain   Final    ABUNDANT WBC PRESENT, PREDOMINANTLY PMN NO ORGANISMS SEEN    Culture  Final    NO GROWTH 2 DAYS NO ANAEROBES ISOLATED; CULTURE IN PROGRESS FOR 5 DAYS Performed at [facility], [address], [place], [place] [address]    Report Status PENDING  Incomplete  Culture, blood (Routine X 2) w Reflex to ID Panel     Status: None (Preliminary result)    Collection Time: 11/06/22  7:14 PM   Specimen: BLOOD  Result Value Ref Range Status   Specimen Description BLOOD SITE NOT SPECIFIED  Final   Special Requests   Final    BOTTLES DRAWN AEROBIC AND ANAEROBIC Blood Culture adequate volume   Culture   Final    NO GROWTH < 24 HOURS Performed at [facility], [address] [place], [place] [address]    Report Status PENDING  Incomplete  Culture, blood (Routine X 2) w Reflex to ID Panel     Status: None (Preliminary result)   Collection Time: 11/06/22  7:14 PM   Specimen: BLOOD  Result Value Ref Range Status   Specimen Description BLOOD SITE NOT SPECIFIED  Final   Special Requests   Final    BOTTLES DRAWN AEROBIC AND ANAEROBIC Blood Culture adequate volume   Culture   Final    NO GROWTH < 24 HOURS Performed at [facility], [address], [place], [place] [address]    Report Status PENDING  Incomplete  Aerobic/Anaerobic Culture w Gram Stain (surgical/deep wound)     Status: None (Preliminary result)   Collection Time: 11/07/22 12:00 PM   Specimen: Wound; Tissue  Result Value Ref Range Status   Specimen Description WOUND  Final   Special Requests medial left leg  Final   Gram Stain   Final    FEW WBC PRESENT, PREDOMINANTLY PMN NO ORGANISMS SEEN Performed at [facility], [address], [place], [place] [address]    Culture PENDING  Incomplete   Report Status PENDING  Incomplete  Aerobic/Anaerobic Culture w Gram Stain (surgical/deep wound)     Status: None (Preliminary result)   Collection Time: 11/07/22 12:18 PM   Specimen: Wound; Tissue  Result Value Ref Range Status   Specimen Description TISSUE  Final   Special Requests right BKA TISSUE  Final   Gram Stain   Final    FEW WBC PRESENT, PREDOMINANTLY PMN NO ORGANISMS SEEN Performed at [facility], [address], [place], [place] [address]    Culture PENDING  Incomplete   Report Status PENDING  Incomplete  Aerobic/Anaerobic Culture w Gram Stain  (surgical/deep wound)     Status: None (Preliminary result)   Collection Time: 11/07/22 12:27 PM   Specimen: Wound; Tissue  Result Value Ref Range Status   Specimen Description TISSUE  Final   Special Requests lateral left thigh  Final   Gram Stain   Final    RARE WBC PRESENT, PREDOMINANTLY PMN NO ORGANISMS SEEN Performed at [facility], [address] [place], [place] [address]    Culture PENDING  Incomplete   Report Status PENDING  Incomplete  Aerobic/Anaerobic Culture w Gram Stain (surgical/deep wound)     Status: None (Preliminary result)   Collection Time: 11/07/22 12:27 PM   Specimen: Wound; Tissue  Result Value Ref Range Status   Specimen Description TISSUE  Final   Special Requests left BKA stamp  Final   Gram Stain   Final    FEW WBC PRESENT,BOTH PMN AND MONONUCLEAR NO ORGANISMS SEEN Performed at [facility], [address], [place], [place] [address]    Culture PENDING  Incomplete   Report Status PENDING  Incomplete         Radiology Studies: No results found.         LOS: 35 days   Time spent= 35 mins    Silvano Bilis, MD [facility]  If 7PM-7AM, please contact night-coverage  11/07/2022, 3:53 PM

## 2022-11-07 NOTE — Progress Notes (Addendum)
Id brief note  Patient signed off previously by ID, however had remained on our ID pharmacist list  She appeared to have a new fever the last 24 hours, and on 9/18 a day prior to that had another I&D of her bilateral thigh  She has had leukemoid reaction persistently in the 30s prior despite abx which were all stopped on 9/6 (meropenem)    She has had a picc line for the past 31 days as well    Lab Results  Component Value Date   WBC 39.2 (H) 11/06/2022   HGB 8.1 (L) 11/06/2022   HCT 24.8 (L) 11/06/2022   MCV 96.7 11/06/2022   PLT 496 (H) 11/06/2022   Last metabolic panel Lab Results  Component Value Date   GLUCOSE 91 11/07/2022   NA 132 (L) 11/07/2022   K 4.0 11/07/2022   CL 114 (H) 11/07/2022   CO2 18 (L) 11/07/2022   BUN 37 (H) 11/07/2022   CREATININE 1.28 (H) 11/07/2022   GFRNONAA 57 (L) 11/07/2022   CALCIUM 6.5 (L) 11/07/2022   PHOS 4.2 10/28/2022   PROT 4.9 (L) 10/14/2022   ALBUMIN <1.5 (L) 10/14/2022   BILITOT 0.4 10/14/2022   ALKPHOS 193 (H) 10/14/2022   AST 23 10/14/2022   ALT 18 10/14/2022   ANIONGAP 0 (L) 11/07/2022    She is in the or for reevaluation of the thighs by Dr Lajoyce Corners today and I wasn't able to see her    A/p Nec fasc bilateral LE s/p bilateral bka Ongoing leukemoid reaction   ?ongoing necrosis of legs in setting of severe chronic dm and ?pvd  Most recent washout 9/6 cx negative 9/18 growing rare klebsiella and steno. The steno likely a feckless bystander. Query colonization of wound vs new soft tissue infection, as she has had pseudomonas and mssa with the initial event  Other consideration is line infection   9/19 bcx cooking  -continue vanc/cefepime/flagyl which were restarted 9/19 -f/u blood culture -it didn't appear she was needed iv abx prior and the picc should be removed to prevent CLABSI -if the blood culture is negative would treat the klebsiella soft tissue infection and an oral agent would be fine; avoid central line if  possible -discussed with primary team     I spent more than 15 minute reviewing data/chart, and coordinating care, discussing diagnostics/treatment plan with treatment team

## 2022-11-07 NOTE — Progress Notes (Addendum)
Nutrition Follow-up  DOCUMENTATION CODES:   Not applicable  INTERVENTION:   Once diet is resumed:   -Continue double protein portions with meals -Continue Magic cup TID with meals, each supplement provides 290 kcal and 9 grams of protein  -Continue 1000 mg vitamin C daily -Continue 1 packet Juven BID, each packet provides 95 calories, 2.5 grams of protein (collagen), and 9.8 grams of carbohydrate (3 grams sugar); also contains 7 grams of L-arginine and L-glutamine, 300 mg vitamin C, 15 mg vitamin E, 1.2 mcg vitamin B-12, 9.5 mg zinc, 200 mg calcium, and 1.5 g  Calcium Beta-hydroxy-Beta-methylbutyrate to support wound healing   NUTRITION DIAGNOSIS:   Increased nutrient needs related to wound healing as evidenced by estimated needs.  Ongoing  GOAL:   Patient will meet greater than or equal to 90% of their needs  Progressing   MONITOR:   PO intake, Supplement acceptance  REASON FOR ASSESSMENT:   Consult Enteral/tube feeding initiation and management  ASSESSMENT:   32 y.o. female admits related to leg swelling. PMH includes: acute H. Pylori gastric ulcer, DM, gastroparesis, DKA, GERD, HTN, hyperthyroidism, normocytic anemia. Pt is currently receiving medical management related to sepsis due to cellulitis.  9/6- s/p bilateral BKA, applications of wound vac x 2, application of stump shrinker, aspiration of lt knee; advanced to full liquid diet 9/8- advanced to soft diet  9/9- cortrak d/c 9/14- per ID, CT scans of bilateral legs reveal hematoma  Reviewed I/O's: +2.2 L x 24 hours and +1 L since 9/6/2  Pt down in OR at time of visit; unable to obtain further history from pt at this time.   Per orthopedics notes, plan for bilateral thigh debridement today secondary to cellulitis. Pt currently NPO for procedure.   Pt previously on a carb nodded diet. Noted meal completions 60-100%. Pt fairly complaint with supplements.   No wt loss since admission.   Per TOC notes, plan  for SNF placement once medically stable, Insurance authorization has been contained.   Medications reviewed and include vitamin C, neurontin, and reglan,   Labs reviewed: CBGS: 108-167 (inpatient orders for glycemic control are 0-5 units insulin aspart daily at bedtime, 0-9 units insulin aspart TID with meals).    Diet Order:   Diet Order             Diet NPO time specified  Diet effective ____                   EDUCATION NEEDS:   Not appropriate for education at this time  Skin:  Skin Assessment: Skin Integrity Issues: Skin Integrity Issues:: Stage II, Incisions, Wound VAC Stage II: rt elbow Wound Vac: closed rt and lt legs s/p bilateral BKA Incisions: closed rt and lt legs s/p bilateral BKA Other: -  Last BM:  11/06/22 (type 6)  Height:   Ht Readings from Last 1 Encounters:  11/05/22 5\' 7"  (1.702 m)    Weight:   Wt Readings from Last 1 Encounters:  11/07/22 104.9 kg    Ideal Body Weight:  53.8 kg (adjusted for bilateral BKAs)  BMI:  Body mass index is 36.22 kg/m.  Estimated Nutritional Needs:   Kcal:  1950-2150  Protein:  120-135 grams  Fluid:  > 2 L    Levada Schilling, RD, LDN, CDCES Registered Dietitian II Certified Diabetes Care and Education Specialist Please refer to [facility] for RD and/or RD on-call/weekend/after hours pager

## 2022-11-07 NOTE — Progress Notes (Signed)
Wound vac canister changed.

## 2022-11-07 NOTE — Transfer of Care (Signed)
Immediate Anesthesia Transfer of Care Note  Patient: Kelli Hudson  Procedure(s) Performed: BILATERAL THIGH AND LEG DEBRIDEMENT (Bilateral: Leg Upper)  Patient Location: PACU  Anesthesia Type:General  Level of Consciousness: awake, alert , and oriented  Airway & Oxygen Therapy: Patient Spontanous Breathing and Patient connected to nasal cannula oxygen  Post-op Assessment: Report given to RN, Post -op Vital signs reviewed and stable, Patient moving all extremities X 4, and Patient able to stick tongue midline  Post vital signs: Reviewed and stable  Last Vitals:  Vitals Value Taken Time  BP 113/66 11/07/22 1305  Temp 98.6   Pulse 75 11/07/22 1305  Resp 10 11/07/22 1308  SpO2 100 % 11/07/22 1305  Vitals shown include unfiled device data.  Last Pain:  Vitals:   11/07/22 1008  TempSrc: Oral  PainSc: 10-Worst pain ever      Patients Stated Pain Goal: 2 (11/07/22 1008)  Complications: No notable events documented.

## 2022-11-07 NOTE — TOC Progression Note (Signed)
Transition of Care [facility]) - Progression Note    Patient Details  Name: Kelli Hudson MRN: [id number] Date of Birth: Jun 19, 1990  [facility]) CM/SW Contact  Eduard Roux, [place] Phone Number: 11/07/2022, 3:39 PM  Clinical Narrative:     TOC continues to follow for medical readiness- patient and family were agreeable to [facility]- authorization goof through 09/29.   Antony Blackbird, MSW, LCSW Clinical Social Worker    Expected Discharge Plan: Skilled Nursing Facility Barriers to Discharge: Continued Medical Work up, SNF Pending bed offer  Expected Discharge Plan and Services In-house Referral: Clinical Social Work   Post Acute Care Choice: Skilled Nursing Facility Living arrangements for the past 2 months: Apartment                                       Social Determinants of Health (SDOH) Interventions SDOH Screenings   Food Insecurity: No Food Insecurity (10/03/2022)  Housing: Low Risk  (10/03/2022)  Transportation Needs: No Transportation Needs (10/03/2022)  Utilities: Not At Risk (10/03/2022)  Alcohol Screen: Low Risk  (05/29/2021)  Depression (PHQ2-9): Low Risk  (05/29/2021)  Recent Concern: Depression (PHQ2-9) - Medium Risk (04/10/2021)  Financial Resource Strain: Low Risk  (02/13/2022)   Received from [facility], [facility]  Social Connections: Unknown (06/17/2021)   Received from [facility], [facility]  Stress: No Stress Concern Present (09/16/2022)   Received from [facility]  Tobacco Use: Low Risk  (11/07/2022)    Readmission Risk Interventions    05/08/2022    9:45 AM 03/24/2022    3:33 PM 12/12/2021    8:59 AM  Readmission Risk Prevention Plan  Transportation Screening Complete Complete Complete  Medication Review (RN Care Manager) Complete Complete Complete  PCP or Specialist appointment within 3-5 days of discharge Complete Complete Complete  HRI or Home Care Consult Complete Complete Complete  SW  Recovery Care/Counseling Consult Complete Complete Complete  Palliative Care Screening Not Applicable Not Applicable Not Applicable  Skilled Nursing Facility Not Applicable Not Applicable Not Applicable

## 2022-11-07 NOTE — Anesthesia Procedure Notes (Signed)
Procedure Name: Intubation Date/Time: 11/07/2022 11:05 AM  Performed by: Cy Blamer, CRNAPre-anesthesia Checklist: Patient identified, Emergency Drugs available, Suction available and Patient being monitored Patient Re-evaluated:Patient Re-evaluated prior to induction Oxygen Delivery Method: Circle system utilized Preoxygenation: Pre-oxygenation with 100% oxygen Induction Type: IV induction Ventilation: Mask ventilation without difficulty Laryngoscope Size: Miller and 2 Grade View: Grade I Tube type: Oral Tube size: 7.0 mm Number of attempts: 1 Airway Equipment and Method: Stylet and Bite block Placement Confirmation: ETT inserted through vocal cords under direct vision, positive ETCO2 and breath sounds checked- equal and bilateral Secured at: 21 cm Tube secured with: Tape Dental Injury: Teeth and Oropharynx as per pre-operative assessment

## 2022-11-07 NOTE — Progress Notes (Addendum)
Iv team rounded on pt for PICC removal. Pt still ordered for IV medications and no appropriate veins were assessed at this time for PIV or USIV. Primary RN notified. PICC remains in place.

## 2022-11-07 NOTE — Progress Notes (Signed)
VAST RN to bedside for 2nd assess of vasculature to obtain IV access and remove PICC. Bilateral arms assessed utilizing ultrasound; vessels too small or too deep for USGIV placement. Recommend that PICC remain in place.

## 2022-11-07 NOTE — Op Note (Signed)
11/07/2022  1:05 PM  PATIENT:  Kelli Hudson    PRE-OPERATIVE DIAGNOSIS:  Necrotizing Fasciitis bilateral lower extremities  POST-OPERATIVE DIAGNOSIS:  Same  PROCEDURE:  BILATERAL THIGH AND CALF EXCISIONAL DEBRIDEMENT, with excision skin and soft tissue muscle and fascia. Application of vancomycin powder total of 6 g. Application of Kerecis micro graft 95 cm x 3. Tissue cultures sent from bilateral transtibial amputations as well as left calf wound. Revision bilateral transtibial amputations. Application of customizable wound VAC sponges x 2 for each lower extremity. Irrigation with wound Vashe to both lower extremities.   SURGEON:  Nadara Mustard, MD  PHYSICIAN ASSISTANT:None ANESTHESIA:   General  PREOPERATIVE INDICATIONS:  Kelli Hudson is a  32 y.o. female with a diagnosis of Necrotizing Fasciitis who failed conservative measures and elected for surgical management.    The risks benefits and alternatives were discussed with the patient preoperatively including but not limited to the risks of infection, bleeding, nerve injury, cardiopulmonary complications, the need for revision surgery, among others, and the patient was willing to proceed.  OPERATIVE IMPLANTS:   Implant Name Type Inv. Item Serial No. Manufacturer Lot No. LRB No. Used Action  GRAFT SKIN WND SURGICLOSE M95 - [id number] Tissue GRAFT SKIN WND Barrett Shell  [employer] [id number] Left 1 Implanted  GRAFT SKIN WND SURGICLOSE M95 - [id number] Tissue GRAFT SKIN WND [employer]  [employer] [id number] Left 1 Implanted  GRAFT SKIN WND SURGICLOSE M95 - [id number] Tissue GRAFT SKIN WND SURGICLOSE M95  [employer] [id number] Right 1 Implanted    @ENCIMAGES @  OPERATIVE FINDINGS: Patient had abscess and necrotic tissue over the residual limbs of both transtibial amputations.  Tissue was sent for cultures and bilateral transtibial amputations revised.  Patient also had new abscess medial left calf.   This tissue was also sent for cultures.  OPERATIVE PROCEDURE: Patient was brought the operating room and underwent a general anesthetic.  After adequate levels anesthesia were obtained both lower extremities were prepped using DuraPrep draped into a sterile field a timeout was called.  Attention was first focused on the left lower extremity.  The medial and lateral thigh incisions were opened irrigated debrided cleansed with wound Vashe.  There was excision of skin soft tissue muscle and fascia.  The muscle had good contractility after debridement.  These areas were filled with 2 g of vancomycin powder and 95 cm of Kerecis graft.  Attention was focused on the medial calf incision this was opened up and there was purulent drainage this was sent for cultures the wound was debrided with excisional debridement of skin soft tissue muscle and fascia.  Tissue margins were viable.  This was irrigated with Vashe and also filled with vancomycin powder and Kerecis micro graft.  The incision was closed.  There was necrotic tissue over the residual transtibial amputation.  An elliptical incision was made around the edges of the incision and the soft tissues muscle skin were excised and the distal 2 cm of tibia and fibula were excised as well.  Electrocautery was used hemostasis the wound was irrigated with Vashe irrigation and the wound bed was filled with 95 cm of Kerecis micro graft and 2 g of vancomycin powder.  Incision closed using 2-0 nylon these wounds were all covered with a continuous customizable wound VAC sponge.  Attention was then focused on the right lower extremity.  There was purulent drainage from the transtibial amputation necessitating a revision of the transtibial amputation.  This was excised back to healthy viable  margins.  2 cm of distal tibia and fibula were excised.  Electrocautery was used for hemostasis.  The soft tissue was sent for cultures.  The wound was irrigated with Vashe.  The wound bed was  filled with 95 cm of Kerecis micro graft and 2 g of vancomycin powder.  The wound was loosely approximated with 2-0 nylon.  The medial and lateral calf and thigh incisions were also open debrided with excisional debridement of skin soft tissue muscle and fascia of the medial lateral aspect of the right thigh as well as the calf wounds.  There is no purulent abscess.  The remainder of the Kerecis micro graft and vancomycin powder was placed within the wounds the wounds were closed loosely with 2-0 nylon.  A continuous wound VAC sponge was used to cover all the wounds this was sealed with Ioban and derma tack.  This had a good suction fit patient was extubated taken the PACU in stable condition.   DISCHARGE PLANNING:  Antibiotic duration: Continue IV antibiotics adjust according to new culture sensitivities  .

## 2022-11-07 NOTE — Progress Notes (Signed)
PT Cancellation Note  Patient Details Name: Saanvika Mcphaul MRN: [id number] DOB: 1990/06/26   Cancelled Treatment:    Reason Eval/Treat Not Completed: (P) Patient at procedure or test/unavailable Pt has returned to OR for I&D. Pt will follow back this afternoon as able.  Channa Hazelett B. Beverely Risen PT, DPT Acute Rehabilitation Services Please use secure chat or  Call Office [phone]    Elon Alas [facility] 11/07/2022, 9:53 AM

## 2022-11-07 NOTE — Interval H&P Note (Signed)
History and Physical Interval Note:  11/07/2022 6:42 AM  Kelli Hudson  has presented today for surgery, with the diagnosis of Necrotizing Fasciitis.  The various methods of treatment have been discussed with the patient and family. After consideration of risks, benefits and other options for treatment, the patient has consented to  Procedure(s): BILATERAL THIGH AND LEG DEBRIDEMENT (Bilateral) as a surgical intervention.  The patient's history has been reviewed, patient examined, no change in status, stable for surgery.  I have reviewed the patient's chart and labs.  Questions were answered to the patient's satisfaction.     Nadara Mustard

## 2022-11-08 ENCOUNTER — Encounter (HOSPITAL_COMMUNITY): Payer: Self-pay | Admitting: Orthopedic Surgery

## 2022-11-08 DIAGNOSIS — M726 Necrotizing fasciitis: Secondary | ICD-10-CM | POA: Diagnosis not present

## 2022-11-08 LAB — CBC
HCT: 25.7 % — ABNORMAL LOW (ref 36.0–46.0)
Hemoglobin: 8.5 g/dL — ABNORMAL LOW (ref 12.0–15.0)
MCH: 30.1 pg (ref 26.0–34.0)
MCHC: 33.1 g/dL (ref 30.0–36.0)
MCV: 91.1 fL (ref 80.0–100.0)
Platelets: 223 10*3/uL (ref 150–400)
RBC: 2.82 MIL/uL — ABNORMAL LOW (ref 3.87–5.11)
RDW: 14.8 % (ref 11.5–15.5)
WBC: 37.4 10*3/uL — ABNORMAL HIGH (ref 4.0–10.5)
nRBC: 0 % (ref 0.0–0.2)

## 2022-11-08 LAB — GLUCOSE, CAPILLARY
Glucose-Capillary: 198 mg/dL — ABNORMAL HIGH (ref 70–99)
Glucose-Capillary: 259 mg/dL — ABNORMAL HIGH (ref 70–99)
Glucose-Capillary: 254 mg/dL — ABNORMAL HIGH (ref 70–99)
Glucose-Capillary: 189 mg/dL — ABNORMAL HIGH (ref 70–99)

## 2022-11-08 LAB — BASIC METABOLIC PANEL
Anion gap: 9 (ref 5–15)
BUN: 45 mg/dL — ABNORMAL HIGH (ref 6–20)
CO2: 16 mmol/L — ABNORMAL LOW (ref 22–32)
Calcium: 7.2 mg/dL — ABNORMAL LOW (ref 8.9–10.3)
Chloride: 104 mmol/L (ref 98–111)
Creatinine, Ser: 1.57 mg/dL — ABNORMAL HIGH (ref 0.44–1.00)
GFR, Estimated: 45 mL/min — ABNORMAL LOW (ref >60)
Glucose, Bld: 228 mg/dL — ABNORMAL HIGH (ref 70–99)
Potassium: 5.4 mmol/L — ABNORMAL HIGH (ref 3.5–5.1)
Sodium: 129 mmol/L — ABNORMAL LOW (ref 135–145)

## 2022-11-08 MED ORDER — SODIUM CHLORIDE 0.9 % IV SOLN
1.0000 g | Freq: Three times a day (TID) | INTRAVENOUS | Status: DC
  Administered 2022-11-08 – 2022-11-17 (×27): 1 g via INTRAVENOUS
  Filled 2022-11-08 (×27): qty 20

## 2022-11-08 MED ORDER — VANCOMYCIN HCL 1250 MG/250ML IV SOLN
1250.0000 mg | INTRAVENOUS | Status: DC
  Administered 2022-11-08 – 2022-11-09 (×2): 1250 mg via INTRAVENOUS
  Filled 2022-11-08 (×3): qty 250

## 2022-11-08 MED ORDER — ALTEPLASE 2 MG IJ SOLR
2.0000 mg | Freq: Once | INTRAMUSCULAR | Status: AC
  Administered 2022-11-08: 2 mg
  Filled 2022-11-08: qty 2

## 2022-11-08 NOTE — Progress Notes (Signed)
PROGRESS NOTE    Kelli Hudson  ZOX:[id number] DOB: [birth year] DOA: 10/03/2022 PCP: Norm Salt, PA    Brief Narrative:  32 y.o. female with medical history significant of DM1, diabetic gastroparesis with very frequent admission in the past, HTN presented w/ c/o B foot wounds, fever, and pain and informed that she has been dealing with lower extremity wound for past 2 weeks ,followed with her podiatrist in the outpatient setting at [facility] there which she recently finished a course of doxycycline which did not help her symptoms, initially had a blister that ruptured subsequently with redness streaking up her legs and noticing fever chills worsening pain in last few days.  Patient admitted for sepsis due to cellulitis/lower extremity wound, severe anemia, AKI. Podiatry consulted- Unable to complete MRI as gastric pacemaker is not MRI safe.  ID and Ortho were consulted.  Patient went to the OR for bilateral lower extremity necrotizing fasciitis feet to his thigh, remained intubated and required PRBC and FFP transfusion during the case on 8/21.  Eventually extubated on 8/23 and was taken back to the OR on 8/26 for wound VAC change.  Patient has remained on IV antibiotics.  Also underwent bilateral lower extremity BKA by Dr. Lajoyce Corners on 9/6. Since her surgery intermittently having anemia without any obvious signs of blood loss requiring PRBC transfusion, WBC still remains elevated but stable, no antibiotics per ID.  Repeat CT showed persistent fluid collection, s/p OR 9/18, and again on 9/20 for repeat debridement   Assessment & Plan:   Principal Problem:   Necrotizing fasciitis (HCC) Active Problems:   AKI (acute kidney injury) (HCC)   Sepsis due to cellulitis (HCC)   Cellulitis of lower extremity   Anemia   Diabetic gastroparesis associated with type 1 diabetes mellitus (HCC)   DM type 1 (diabetes mellitus, type 1) (HCC)   Drug-seeking behavior   Sepsis (HCC)    Necrotizing fasciitis of pelvic region and thigh (HCC)   Necrotizing fasciitis of lower leg (HCC)   MDD (major depressive disorder), recurrent episode, moderate (HCC)   Severe sepsis due to bilateral feet abscess, necrotizing fasciitis, POA.  MRSE, sensitive Pseudomonas S/p bilateral transtibial amputation - Initially underwent I&D on 8/21 with wound VAC change on 8/26 but ultimately required bilateral BKA by orthopedic on 9/6.  Repeat CT showed some changes with fluids collection therefore went back to the OR on 9/18 by Dr Lajoyce Corners for further I&D, repeat cultures sent. Developed fever so back to OR on 9/19, Back to the OR again debridement, amputation revision. Several abscesses encountered. Blood and wound cultures are pending. ID continues to follow, advises line holiday but IV team says nowhere for peripheral access, back on vanc/cefepime/flagyl pending culture results. Continue pain control.  Encourage nutrition.  Hypotension with tachycardia Stable today. continuing Midodrine 5 mg 3 times daily.  Acute blood loss anemia due to operative blood loss IV iron and multiple units of PRBC during her stay.  1 additional unit of PRBC 9/20.  Responded appropriately.  Will continue to monitor.     Type 1 diabetes, poorly controlled with intermittent hypoglycemia Controlled currently on long-acting insulin and short acting insulin.  Continue current sliding scale and Accu-Chek.  Adjust as necessary   Acute kidney injury on CKD stage IIIa due to septic ATN, improving Non-anion gap metabolic acidosis Hyponatremia, euvolemic Resolved now back to baseline creatinine ~1.2.  Peaked at 2.54   Severe protein calorie malnutrition Cortrak removed 9/9.  Encouraging oral diet, dietitian  following.  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  Currently not having active symptoms.   Hypertension IV as needed   Depression -On Cymbalta at home   Bilateral upper extremity  edema:  Advised to elevate extremities.   Mild hyperkalemia - Stable.  Close monitoring.  Hypomagnesemia - PRN repletion.  Continue to monitor.  Replaced 9/20.   PT/OT-SNF, per TOC we have insurance authorization until 9/29    DVT prophylaxis: heparin   Code Status: Full Code  Family Communication: Uncle at the bedside  Status is: Inpatient Remains inpatient appropriate because: More inpatient procedures planned.  IV antibiotics.  Subjective:  Patient seen and examined.  Her uncle was at the bedside.  Patient tells me that she is able to eat fair enough.  Still has significant pain but controlled with injection pain medication.   Examination:  General: Fairly comfortable.  Alert awake and oriented.  Interactive. Cardiovascular: S1-S2 normal.  Regular rate rhythm. Respiratory: Bilateral clear.  No added sounds. Gastrointestinal: Soft.  Nontender.  Bowel sound present. Ext: Both lower extremity with below-knee amputation stump on wound VAC Both inner thigh incisions on wound VAC. Neuro: Alert and awake.     Pressure Injury 10/07/22 Elbow Posterior;Right Stage 2 -  Partial thickness loss of dermis presenting as a shallow open injury with a red, pink wound bed without slough. (Active)  10/07/22 1226  Location: Elbow  Location Orientation: Posterior;Right  Staging: Stage 2 -  Partial thickness loss of dermis presenting as a shallow open injury with a red, pink wound bed without slough.  Wound Description (Comments):   Present on Admission:      Diet Orders (From admission, onward)     Start     Ordered   11/07/22 1553  Diet Carb Modified Fluid consistency: Thin; Room service appropriate? Yes  Diet effective now       Question Answer Comment  Diet-HS Snack? Nothing   Calorie Level Medium 1600-2000   Fluid consistency: Thin   Room service appropriate? Yes      11/07/22 1552            Objective: Vitals:   11/08/22 0341 11/08/22 0507 11/08/22 0804 11/08/22  1113  BP: (!) 97/58 (!) 94/55 132/72 (!) 88/53  Pulse: 91 90 98 80  Resp: 12 12 11 15   Temp: 98.3 F (36.8 C) 98.4 F (36.9 C) 98.1 F (36.7 C) 98.2 F (36.8 C)  TempSrc: Oral Oral Oral Oral  SpO2: 100% 100% 100% 100%  Weight:  104.7 kg    Height:        Intake/Output Summary (Last 24 hours) at 11/08/2022 1420 Last data filed at 11/08/2022 1308 Gross per 24 hour  Intake 710 ml  Output 1775 ml  Net -1065 ml   Filed Weights   11/06/22 0500 11/07/22 0321 11/08/22 0507  Weight: 103.2 kg 104.9 kg 104.7 kg    Scheduled Meds:  sodium chloride   Intravenous Once   sodium chloride   Intravenous Once   acetaminophen  1,000 mg Oral Q6H   vitamin C  1,000 mg Oral Daily   Chlorhexidine Gluconate Cloth  6 each Topical Q0600   docusate sodium  100 mg Oral BID   DULoxetine  30 mg Oral Daily   folic acid  1 mg Oral Daily   gabapentin  300 mg Oral Q8H   heparin injection (subcutaneous)  5,000 Units Subcutaneous Q8H   hydrocortisone cream   Topical TID  insulin aspart  0-5 Units Subcutaneous QHS   insulin aspart  0-9 Units Subcutaneous TID WC   methocarbamol  1,000 mg Oral TID   metoCLOPramide  10 mg Oral Q8H   metoprolol tartrate  25 mg Oral BID   midodrine  5 mg Oral TID WC   multivitamin with minerals  1 tablet Oral Daily   nutrition supplement (JUVEN)  1 packet Oral BID BM   omeprazole  40 mg Oral Daily   silver nitrate applicators  1 Application Topical Once   sodium chloride flush  10-40 mL Intracatheter Q12H   Continuous Infusions:  sodium chloride 10 mL/hr at 11/06/22 1031   sodium chloride Stopped (11/06/22 0941)   ceFEPime (MAXIPIME) IV 2 g (11/08/22 1216)   methocarbamol (ROBAXIN) IV     metronidazole 500 mg (11/08/22 0626)   vancomycin 1,500 mg (11/07/22 2213)    Nutritional status Signs/Symptoms: estimated needs Interventions: Juven, Glucerna shake Body mass index is 36.15 kg/m.  Data Reviewed:   CBC: Recent Labs  Lab 11/04/22 1021 11/05/22 0400  11/06/22 0221 11/06/22 1450 11/07/22 1900 11/08/22 0713  WBC 40.6* 44.9* 39.2*  --  44.6* 37.4*  HGB 8.5* 7.5* 6.4* 8.1* 6.3* 8.5*  HCT 27.0* 24.6* 20.3* 24.8* 19.1* 25.7*  MCV 93.8 94.3 96.7  --  91.8 91.1  PLT 643* 572* 496*  --  408* 223   Basic Metabolic Panel: Recent Labs  Lab 11/02/22 0413 11/03/22 0329 11/04/22 1021 11/07/22 0425 11/07/22 1900 11/08/22 0713  NA 131* 131* 131* 132*  --  129*  K 4.8 4.5 4.6 4.0  --  5.4*  CL 102 103 104 114*  --  104  CO2 21* 21* 20* 18*  --  16*  GLUCOSE 103* 118* 118* 91  --  228*  BUN 39* 41* 33* 37*  --  45*  CREATININE 1.30* 1.26* 1.13* 1.28*  --  1.57*  CALCIUM 7.5* 7.4* 7.3* 6.5*  --  7.2*  MG 1.8 1.8 1.6*  --  1.5*  --    GFR: Estimated Creatinine Clearance: 64.6 mL/min (A) (by C-G formula based on SCr of 1.57 mg/dL (H)). Liver Function Tests: No results for input(s): "AST", "ALT", "ALKPHOS", "BILITOT", "PROT", "ALBUMIN" in the last 168 hours. No results for input(s): "LIPASE", "AMYLASE" in the last 168 hours. No results for input(s): "AMMONIA" in the last 168 hours. Coagulation Profile: No results for input(s): "INR", "PROTIME" in the last 168 hours. Cardiac Enzymes: No results for input(s): "CKTOTAL", "CKMB", "CKMBINDEX", "TROPONINI" in the last 168 hours. BNP (last 3 results) No results for input(s): "PROBNP" in the last 8760 hours. HbA1C: No results for input(s): "HGBA1C" in the last 72 hours. CBG: Recent Labs  Lab 11/07/22 1310 11/07/22 1559 11/07/22 2107 11/08/22 0558 11/08/22 1131  GLUCAP 95 97 187* 198* 259*   Lipid Profile: No results for input(s): "CHOL", "HDL", "LDLCALC", "TRIG", "CHOLHDL", "LDLDIRECT" in the last 72 hours. Thyroid Function Tests: No results for input(s): "TSH", "T4TOTAL", "FREET4", "T3FREE", "THYROIDAB" in the last 72 hours. Anemia Panel: No results for input(s): "VITAMINB12", "FOLATE", "FERRITIN", "TIBC", "IRON", "RETICCTPCT" in the last 72 hours. Sepsis Labs: No results for  input(s): "PROCALCITON", "LATICACIDVEN" in the last 168 hours.  Recent Results (from the past 240 hour(s))  MRSA Next Gen by PCR, Nasal     Status: None   Collection Time: 11/05/22  6:22 AM   Specimen: Nasal Mucosa; Nasal Swab  Result Value Ref Range Status   MRSA by PCR Next Gen NOT DETECTED  NOT DETECTED Final    Comment: (NOTE) The GeneXpert MRSA Assay (FDA approved for NASAL specimens only), is one component of a comprehensive MRSA colonization surveillance program. It is not intended to diagnose MRSA infection nor to guide or monitor treatment for MRSA infections. Test performance is not FDA approved in patients less than 26 years old. Performed at [facility], [address] [place], [place] [address]   Aerobic/Anaerobic Culture w Gram Stain (surgical/deep wound)     Status: None (Preliminary result)   Collection Time: 11/05/22 11:46 AM   Specimen: PATH Cytology Misc. fluid; Body Fluid  Result Value Ref Range Status   Specimen Description FLUID LEFT KNEE EFFUSION  Final   Special Requests A  Final   Gram Stain   Final    ABUNDANT WBC PRESENT, PREDOMINANTLY PMN NO ORGANISMS SEEN    Culture   Final    NO GROWTH 3 DAYS NO ANAEROBES ISOLATED; CULTURE IN PROGRESS FOR 5 DAYS Performed at [facility], [address] [place], [place] [address]    Report Status PENDING  Incomplete  Aerobic/Anaerobic Culture w Gram Stain (surgical/deep wound)     Status: None (Preliminary result)   Collection Time: 11/05/22 11:49 AM   Specimen: Thigh; Wound  Result Value Ref Range Status   Specimen Description THIGH LEFT LATERAL  Final   Special Requests B  Final   Gram Stain   Final    ABUNDANT WBC PRESENT, PREDOMINANTLY PMN NO ORGANISMS SEEN Performed at [facility], [address], [place], [place] [address]    Culture   Final    CULTURE REINCUBATED FOR BETTER GROWTH NO ANAEROBES ISOLATED; CULTURE IN PROGRESS FOR 5 DAYS    Report Status PENDING  Incomplete   Aerobic/Anaerobic Culture w Gram Stain (surgical/deep wound)     Status: None (Preliminary result)   Collection Time: 11/05/22 11:51 AM   Specimen: Thigh; Wound  Result Value Ref Range Status   Specimen Description TISSUE THIGH LEFT MEDIAL  Final   Special Requests TISSUE C  Final   Gram Stain   Final    ABUNDANT WBC PRESENT, PREDOMINANTLY PMN NO ORGANISMS SEEN Performed at [facility], [address], [place], [place] [address]    Culture   Final    RARE STENOTROPHOMONAS MALTOPHILIA NO ANAEROBES ISOLATED; CULTURE IN PROGRESS FOR 5 DAYS    Report Status PENDING  Incomplete   Organism ID, Bacteria STENOTROPHOMONAS MALTOPHILIA  Final      Susceptibility   Stenotrophomonas maltophilia - MIC*    LEVOFLOXACIN 0.5 SENSITIVE Sensitive     TRIMETH/SULFA <=20 SENSITIVE Sensitive     * RARE STENOTROPHOMONAS MALTOPHILIA  Aerobic/Anaerobic Culture w Gram Stain (surgical/deep wound)     Status: None (Preliminary result)   Collection Time: 11/05/22 11:55 AM   Specimen: Thigh; Wound  Result Value Ref Range Status   Specimen Description THIGH RIGHT MEDIAL  Final   Special Requests DIALYSIS  Final   Gram Stain   Final    ABUNDANT WBC PRESENT, PREDOMINANTLY PMN NO ORGANISMS SEEN    Culture   Final    FEW KLEBSIELLA PNEUMONIAE FEW STENOTROPHOMONAS MALTOPHILIA CULTURE REINCUBATED FOR BETTER GROWTH HOLDING FOR POSSIBLE ANAEROBE Performed at [facility], [address] [place], [place] [address]    Report Status PENDING  Incomplete  Aerobic/Anaerobic Culture w Gram Stain (surgical/deep wound)     Status: None (Preliminary result)   Collection Time: 11/05/22 11:56 AM   Specimen:  Thigh; Wound  Result Value Ref Range Status   Specimen Description THIGH RIGHT LATERAL  Final   Special Requests THIGH E  Final   Gram Stain   Final    ABUNDANT WBC PRESENT, PREDOMINANTLY PMN NO ORGANISMS SEEN    Culture   Final    NO GROWTH 3 DAYS NO ANAEROBES ISOLATED; CULTURE IN PROGRESS FOR 5  DAYS Performed at [facility], [address] [place], [place] [address]    Report Status PENDING  Incomplete  Culture, blood (Routine X 2) w Reflex to ID Panel     Status: None (Preliminary result)   Collection Time: 11/06/22  7:14 PM   Specimen: BLOOD  Result Value Ref Range Status   Specimen Description BLOOD SITE NOT SPECIFIED  Final   Special Requests   Final    BOTTLES DRAWN AEROBIC AND ANAEROBIC Blood Culture adequate volume   Culture   Final    NO GROWTH 2 DAYS Performed at [facility], [address], [place], [place] [address]    Report Status PENDING  Incomplete  Culture, blood (Routine X 2) w Reflex to ID Panel     Status: None (Preliminary result)   Collection Time: 11/06/22  7:14 PM   Specimen: BLOOD  Result Value Ref Range Status   Specimen Description BLOOD SITE NOT SPECIFIED  Final   Special Requests   Final    BOTTLES DRAWN AEROBIC AND ANAEROBIC Blood Culture adequate volume   Culture   Final    NO GROWTH 2 DAYS Performed at [facility], [address], [place], [place] [address]    Report Status PENDING  Incomplete  Aerobic/Anaerobic Culture w Gram Stain (surgical/deep wound)     Status: None (Preliminary result)   Collection Time: 11/07/22 12:00 PM   Specimen: Wound; Tissue  Result Value Ref Range Status   Specimen Description WOUND  Final   Special Requests medial left leg  Final   Gram Stain   Final    FEW WBC PRESENT, PREDOMINANTLY PMN NO ORGANISMS SEEN Performed at [facility], [address] [place], [place] [address]    Culture RARE STENOTROPHOMONAS MALTOPHILIA  Final   Report Status PENDING  Incomplete  Aerobic/Anaerobic Culture w Gram Stain (surgical/deep wound)     Status: None (Preliminary result)   Collection Time: 11/07/22 12:18 PM   Specimen: Wound; Tissue  Result Value Ref Range Status   Specimen Description TISSUE  Final   Special Requests right BKA TISSUE  Final   Gram Stain   Final    FEW WBC PRESENT,  PREDOMINANTLY PMN NO ORGANISMS SEEN Performed at [facility], [address] [place], [place] [address]    Culture   Final    RARE KLEBSIELLA PNEUMONIAE RARE STENOTROPHOMONAS MALTOPHILIA    Report Status PENDING  Incomplete  Aerobic/Anaerobic Culture w Gram Stain (surgical/deep wound)     Status: None (Preliminary result)   Collection Time: 11/07/22 12:27 PM   Specimen: Wound; Tissue  Result Value Ref Range Status   Specimen Description TISSUE  Final   Special Requests lateral left thigh  Final   Gram Stain   Final    RARE WBC PRESENT, PREDOMINANTLY PMN NO ORGANISMS SEEN    Culture   Final    RARE KLEBSIELLA PNEUMONIAE RARE STENOTROPHOMONAS MALTOPHILIA CULTURE REINCUBATED FOR BETTER GROWTH Performed at [facility], [address] [place], [place] [address]    Report Status PENDING  Incomplete  Aerobic/Anaerobic Culture w Gram Stain (surgical/deep wound)     Status: None (Preliminary result)   Collection Time: 11/07/22 12:27 PM   Specimen: Wound; Tissue  Result Value Ref Range Status   Specimen Description TISSUE  Final   Special Requests left BKA stamp  Final   Gram Stain   Final    FEW WBC PRESENT,BOTH PMN AND MONONUCLEAR NO ORGANISMS SEEN Performed at [facility], [address], [place], [place] [address]    Culture RARE KLEBSIELLA PNEUMONIAE  Final   Report Status PENDING  Incomplete         Radiology Studies: No results found.         LOS: 36 days   Time spent= 35 mins

## 2022-11-08 NOTE — Plan of Care (Signed)
  Problem: Education: Goal: Knowledge of General Education information will improve Description: Including pain rating scale, medication(s)/side effects and non-pharmacologic comfort measures Outcome: Progressing   Problem: Health Behavior/Discharge Planning: Goal: Ability to manage health-related needs will improve Outcome: Progressing   Problem: Clinical Measurements: Goal: Respiratory complications will improve Outcome: Progressing   Problem: Nutrition: Goal: Adequate nutrition will be maintained Outcome: Progressing   Problem: Coping: Goal: Level of anxiety will decrease Outcome: Progressing   Problem: Elimination: Goal: Will not experience complications related to bowel motility Outcome: Progressing Goal: Will not experience complications related to urinary retention Outcome: Progressing   Problem: Safety: Goal: Ability to remain free from injury will improve Outcome: Progressing   Problem: Education: Goal: Ability to verbalize activity precautions or restrictions will improve Outcome: Progressing Goal: Understanding of discharge needs will improve Outcome: Progressing   Problem: Self-Care: Goal: Ability to meet self-care needs will improve Outcome: Progressing   Problem: Pain Management: Goal: Pain level will decrease with appropriate interventions Outcome: Progressing   Problem: Clinical Measurements: Goal: Ability to maintain clinical measurements within normal limits will improve Outcome: Not Progressing Goal: Will remain free from infection Outcome: Not Progressing Goal: Diagnostic test results will improve Outcome: Not Progressing Goal: Cardiovascular complication will be avoided Outcome: Not Progressing   Problem: Activity: Goal: Risk for activity intolerance will decrease Outcome: Not Progressing   Problem: Pain Managment: Goal: General experience of comfort will improve Outcome: Not Progressing   Problem: Skin Integrity: Goal: Risk for  impaired skin integrity will decrease Outcome: Not Progressing   Problem: Education: Goal: Knowledge of the prescribed therapeutic regimen will improve Outcome: Not Progressing   Problem: Activity: Goal: Ability to perform//tolerate increased activity and mobilize with assistive devices will improve Outcome: Not Progressing   Problem: Clinical Measurements: Goal: Postoperative complications will be avoided or minimized Outcome: Not Progressing   Problem: Skin Integrity: Goal: Demonstration of wound healing without infection will improve Outcome: Not Progressing

## 2022-11-08 NOTE — Progress Notes (Signed)
Pharmacy Antibiotic Note  Kelli Hudson is a 32 y.o. female admitted on 10/03/2022 with bilateral thigh cellulitis with L knee effusion s/p debridement.  Pharmacy has been consulted to broaden from Cefazolin to Vancomycin/Cefepime.  Scr uptrending, from 1.13 to 1.57. WBC improved at 37.4 (down from 44.6). Pt remains afebrile.  Plan: Cefepime 2g IV q8h Decrease vancomycin 1250mg  IV q24h for estimated AUC 515 using SCr 1.57, Vd 0.5L/kg Check vancomycin levels at steady state, goal AUC 400-550 Follow up renal function, cultures as available, clinical progress, length of tx  Height: 5\' 7"  (170.2 cm) Weight: 104.7 kg (230 lb 13.2 oz) IBW/kg (Calculated) : 61.6  Temp (24hrs), Avg:98.3 F (36.8 C), Min:97.7 F (36.5 C), Max:99.9 F (37.7 C)  Recent Labs  Lab 11/02/22 0413 11/03/22 0329 11/04/22 1021 11/05/22 0400 11/06/22 0221 11/07/22 0425 11/07/22 1900  WBC 34.8* 33.3* 40.6* 44.9* 39.2*  --  44.6*  CREATININE 1.30* 1.26* 1.13*  --   --  1.28*  --     Estimated Creatinine Clearance: 79.2 mL/min (A) (by C-G formula based on SCr of 1.28 mg/dL (H)).    Allergies  Allergen Reactions   Ibuprofen Other (See Comments)    07/23/22 - Worsening kidney function after 3 doses of ibuprofen 400mg .  Creatinine increased from 1.3 to 2.  Creatinine returned to normal after stopping.   Lisinopril Cough, Other (See Comments), Itching and Rash    Breakout in rash    Outpt Doxy 8/8 x 10 days Cefepime 8/16 >9/1 Vancomycin 8/16 > 8/21 Flagyl 8/16>>9/1 Cefazolin 9/6 x 1 Zyvox 8/20 >> 8/24; 8/25 >9/9 Merrem 9/1 >9/9 Cefazolin 9/18 >> 9/19 Vancomycin 9/19 >> Cefepime 9/19 >> Flagyl 9/19 >>   8/16 Bcx: NGF 8/16 resp panel: negative 8/18 R Foot Wound: ngtd 8/21 R calf tissue: staph aureus, MRSE 8/21 L thigh: corynebacterium 8/21 R foot tissue: MSSA, PsA (pan-susceptible) 8/31 Cdif negative 8/29 Bcx: negative 9/6 R knee:NGF 9/18 Thigh: NGTD 9/18 L knee: NGTD  Thank you for  allowing pharmacy to be a part of this patient's care.  Roslyn Smiling, PharmD PGY1 Pharmacy Resident 11/08/2022 12:48 PM

## 2022-11-08 NOTE — Progress Notes (Signed)
Patient ID: Kelli Hudson, female   DOB: Jan 01, 1991, 32 y.o.   MRN: [id number] Patient is status post repeat debridement of both lower extremities.  She underwent revision of the amputations bilaterally secondary to purulent necrotic tissue in the residual limb.  Patient also had developed a new abscess in the left leg and left thigh.  Cultures are pending.  Patient's hemoglobin is 8.5 after total of 4 units of packed red blood cells.  White cell count has decreased to 37.4.  Discussed with the patient the importance of nutrition supplements with protein supplements 3 times a day.  There has been clear serosanguineous drainage in both wound VAC pumps no evidence of acute bleed.

## 2022-11-08 NOTE — Progress Notes (Signed)
[facility]  Date of Admission:  10/03/2022     Principal Problem:   Necrotizing fasciitis (HCC) Active Problems:   Anemia   Sepsis (HCC)   AKI (acute kidney injury) (HCC)   Diabetic gastroparesis associated with type 1 diabetes mellitus (HCC)   DM type 1 (diabetes mellitus, type 1) (HCC)   Drug-seeking behavior   Sepsis due to cellulitis (HCC)   Cellulitis of lower extremity   Necrotizing fasciitis of pelvic region and thigh (HCC)   Necrotizing fasciitis of lower leg (HCC)   MDD (major depressive disorder), recurrent episode, moderate (HCC)   Effusion, left knee          Assessment: 32 YF admitted with: #Nec fasc SP b/l bka c/b b/l thigh debridements #Ongoing leukemoid Rxn #DM A1c 8.8 on 06/18/22 Initial nec gasc with MSSA, MRSEand PsA underwent I&D on 8/21 9/18 OR Cx+ steno and kleb pneumo MDR ESBL, 1/4 rare yeast 9/20 OR Cx+ Kleb pneumonia  and steno, Cx re-incubating. Noted abscess and necrotic tissue. On cefepime, metro, vanc. Abx restarted on 9/19. PICC in place>1 month Recommendations: -Follow or cx form 9/20(reincubaing) -Switch cefepime and metronidazole to ertapenem for MDR kleb.no plans to cover steno for now -Continue vancomycin for now, will consider transitioning ing to bactrim to cover steno pending final Cx as both 9/18 and 9/20 Cx + steno -Follow OR Cx -If pt fevers again then pull PICC, would have low threshold to pull central line   Microbiology:   Antibiotics: Recent: Vanc, cefepime, metronidazole 9/19- Cultures: Blood 9/19 NG    SUBJECTIVE: Resting in bed. Mother at bedside, inquiring about why pt "keeps getting infection.". I explained ot the family that she has had a recent debridement for which ID is management antibiotics. The pt's mother asked  "what does debridement mean". I went over that it was a surgical procedure to clear infected tissue. I asked if there were any unanswered questions, to which she  did not at the time.  Interval: tmax 99.9 overnight  Review of Systems: Review of Systems  All other systems reviewed and are negative.    Scheduled Meds:  sodium chloride   Intravenous Once   sodium chloride   Intravenous Once   acetaminophen  1,000 mg Oral Q6H   vitamin C  1,000 mg Oral Daily   Chlorhexidine Gluconate Cloth  6 each Topical Q0600   docusate sodium  100 mg Oral BID   DULoxetine  30 mg Oral Daily   folic acid  1 mg Oral Daily   gabapentin  300 mg Oral Q8H   heparin injection (subcutaneous)  5,000 Units Subcutaneous Q8H   hydrocortisone cream   Topical TID   insulin aspart  0-5 Units Subcutaneous QHS   insulin aspart  0-9 Units Subcutaneous TID WC   methocarbamol  1,000 mg Oral TID   metoCLOPramide  10 mg Oral Q8H   metoprolol tartrate  25 mg Oral BID   midodrine  5 mg Oral TID WC   multivitamin with minerals  1 tablet Oral Daily   nutrition supplement (JUVEN)  1 packet Oral BID BM   omeprazole  40 mg Oral Daily   silver nitrate applicators  1 Application Topical Once   sodium chloride flush  10-40 mL Intracatheter Q12H   Continuous Infusions:  sodium chloride 10 mL/hr at 11/06/22 1031   sodium chloride Stopped (11/06/22 0941)   ceFEPime (MAXIPIME) IV 2 g (11/08/22 0411)  methocarbamol (ROBAXIN) IV     metronidazole 500 mg (11/08/22 0626)   vancomycin 1,500 mg (11/07/22 2213)   PRN Meds:.sodium chloride, acetaminophen, alum & mag hydroxide-simeth, bisacodyl, bisacodyl, diphenhydrAMINE **OR** diphenhydrAMINE, guaiFENesin-dextromethorphan, hydrALAZINE, HYDROmorphone (DILAUDID) injection, HYDROmorphone, loperamide, magnesium citrate, methocarbamol **OR** methocarbamol (ROBAXIN) IV, metoCLOPramide **OR** metoCLOPramide (REGLAN) injection, metoprolol tartrate, naloxone **AND** sodium chloride flush, ondansetron, ondansetron (ZOFRAN) IV, phenol, polyethylene glycol, simethicone, sodium chloride flush, traMADol, traZODone Allergies  Allergen Reactions   Ibuprofen  Other (See Comments)    07/23/22 - Worsening kidney function after 3 doses of ibuprofen 400mg .  Creatinine increased from 1.3 to 2.  Creatinine returned to normal after stopping.   Lisinopril Cough, Other (See Comments), Itching and Rash    Breakout in rash    OBJECTIVE: Vitals:   11/08/22 0341 11/08/22 0507 11/08/22 0804 11/08/22 1113  BP: (!) 97/58 (!) 94/55 132/72 (!) 88/53  Pulse: 91 90 98 80  Resp: 12 12 11 15   Temp: 98.3 F (36.8 C) 98.4 F (36.9 C) 98.1 F (36.7 C) 98.2 F (36.8 C)  TempSrc: Oral Oral Oral Oral  SpO2: 100% 100% 100% 100%  Weight:  104.7 kg    Height:       Body mass index is 36.15 kg/m.  Physical Exam Constitutional:      Appearance: Normal appearance.  HENT:     Head: Normocephalic and atraumatic.     Right Ear: Tympanic membrane normal.     Left Ear: Tympanic membrane normal.     Nose: Nose normal.     Mouth/Throat:     Mouth: Mucous membranes are moist.  Eyes:     Extraocular Movements: Extraocular movements intact.     Conjunctiva/sclera: Conjunctivae normal.     Pupils: Pupils are equal, round, and reactive to light.  Cardiovascular:     Rate and Rhythm: Normal rate and regular rhythm.     Heart sounds: No murmur heard.    No friction rub. No gallop.  Pulmonary:     Effort: Pulmonary effort is normal.     Breath sounds: Normal breath sounds.  Abdominal:     General: Abdomen is flat.     Palpations: Abdomen is soft.  Musculoskeletal:     Comments: B/l LE wound vac  Skin:    General: Skin is warm and dry.  Neurological:     General: No focal deficit present.     Mental Status: She is alert and oriented to person, place, and time.  Psychiatric:        Mood and Affect: Mood normal.       Lab Results Lab Results  Component Value Date   WBC 37.4 (H) 11/08/2022   HGB 8.5 (L) 11/08/2022   HCT 25.7 (L) 11/08/2022   MCV 91.1 11/08/2022   PLT 223 11/08/2022    Lab Results  Component Value Date   CREATININE 1.57 (H) 11/08/2022    BUN 45 (H) 11/08/2022   NA 129 (L) 11/08/2022   K 5.4 (H) 11/08/2022   CL 104 11/08/2022   CO2 16 (L) 11/08/2022    Lab Results  Component Value Date   ALT 18 10/14/2022   AST 23 10/14/2022   ALKPHOS 193 (H) 10/14/2022   BILITOT 0.4 10/14/2022        Danelle Earthly, MD [facility] 11/08/2022, 11:37 AM I have personally spent 54 minutes involved in face-to-face and non-face-to-face activities for this patient on the day of the visit. Professional  time spent includes the following activities: Preparing to see the patient (review of tests), Obtaining and/or reviewing separately obtained history (admission/discharge record), Performing a medically appropriate examination and/or evaluation , Ordering medications/tests/procedures, referring and communicating with other health care professionals, Documenting clinical information in the EMR, Independently interpreting results (not separately reported), Communicating results to the patient/family/caregiver, Counseling and educating the patient/family/caregiver and Care coordination (not separately reported).

## 2022-11-08 NOTE — Progress Notes (Signed)
[name] requests a conversation with the primary MD if possible.   Sunday after 1 pm would be good to call or she will be at bedside in the afternoon.  Please

## 2022-11-09 DIAGNOSIS — M726 Necrotizing fasciitis: Secondary | ICD-10-CM | POA: Diagnosis not present

## 2022-11-09 LAB — BASIC METABOLIC PANEL
Anion gap: 7 (ref 5–15)
BUN: 40 mg/dL — ABNORMAL HIGH (ref 6–20)
CO2: 19 mmol/L — ABNORMAL LOW (ref 22–32)
Calcium: 7.2 mg/dL — ABNORMAL LOW (ref 8.9–10.3)
Chloride: 104 mmol/L (ref 98–111)
Creatinine, Ser: 1.38 mg/dL — ABNORMAL HIGH (ref 0.44–1.00)
GFR, Estimated: 52 mL/min — ABNORMAL LOW (ref >60)
Glucose, Bld: 198 mg/dL — ABNORMAL HIGH (ref 70–99)
Potassium: 4.9 mmol/L (ref 3.5–5.1)
Sodium: 130 mmol/L — ABNORMAL LOW (ref 135–145)

## 2022-11-09 LAB — TYPE AND SCREEN
ABO/RH(D): O POS
Antibody Screen: NEGATIVE
Unit division: 0
Unit division: 0
Unit division: 0
Unit division: 0
Unit division: 0

## 2022-11-09 LAB — BPAM RBC
Blood Product Expiration Date: 202410192359
Blood Product Expiration Date: 202410192359
Blood Product Expiration Date: 202410192359
Blood Product Expiration Date: 202410192359
ISSUE DATE / TIME: 202409190657
ISSUE DATE / TIME: 202409190932
ISSUE DATE / TIME: 202409201149
ISSUE DATE / TIME: 202410192359
Unit Type and Rh: 5100
Unit Type and Rh: 5100
Unit Type and Rh: 5100
Unit Type and Rh: 5100
Unit Type and Rh: 5100
Unit Type and Rh: 5100
Unit Type and Rh: 5100

## 2022-11-09 LAB — CBC
HCT: 24.6 % — ABNORMAL LOW (ref 36.0–46.0)
Hemoglobin: 8 g/dL — ABNORMAL LOW (ref 12.0–15.0)
MCH: 30.3 pg (ref 26.0–34.0)
MCHC: 32.5 g/dL (ref 30.0–36.0)
MCV: 93.2 fL (ref 80.0–100.0)
Platelets: 446 10*3/uL — ABNORMAL HIGH (ref 150–400)
RBC: 2.64 MIL/uL — ABNORMAL LOW (ref 3.87–5.11)
RDW: 15.2 % (ref 11.5–15.5)
WBC: 36.4 10*3/uL — ABNORMAL HIGH (ref 4.0–10.5)
nRBC: 0 % (ref 0.0–0.2)

## 2022-11-09 LAB — GLUCOSE, CAPILLARY
Glucose-Capillary: 161 mg/dL — ABNORMAL HIGH (ref 70–99)
Glucose-Capillary: 208 mg/dL — ABNORMAL HIGH (ref 70–99)
Glucose-Capillary: 215 mg/dL — ABNORMAL HIGH (ref 70–99)
Glucose-Capillary: 160 mg/dL — ABNORMAL HIGH (ref 70–99)

## 2022-11-09 NOTE — Progress Notes (Signed)
PROGRESS NOTE    Kelli Hudson  ZOX:[id number] DOB: 1991/01/31 DOA: 10/03/2022 PCP: Norm Salt, PA    Brief Narrative:  32 y.o. female with medical history significant of DM1, diabetic gastroparesis with very frequent admission in the past, HTN presented w/ c/o B foot wounds, fever, and pain and informed that she has been dealing with lower extremity wound for past 2 weeks ,followed with her podiatrist in the outpatient setting at [facility] there which she recently finished a course of doxycycline which did not help her symptoms, initially had a blister that ruptured subsequently with redness streaking up her legs and noticing fever chills worsening pain in last few days.  Patient admitted for sepsis due to cellulitis/lower extremity wound, severe anemia, AKI. Podiatry consulted- Unable to complete MRI as gastric pacemaker is not MRI safe.  ID and Ortho were consulted.  Patient went to the OR for bilateral lower extremity necrotizing fasciitis from feet to thigh, remained intubated and required PRBC and FFP transfusion during the case on 8/21.  Eventually extubated on 8/23 and was taken back to the OR on 8/26 for wound VAC change.  Patient has remained on IV antibiotics.  Also underwent bilateral lower extremity BKA by Dr. Lajoyce Corners on 9/6. Since her surgery intermittently having anemia without any obvious signs of blood loss requiring PRBC transfusion, WBC still remains elevated but stable. Repeat CT showed persistent fluid collection, s/p OR 9/18, and again on 9/20 for repeat debridement.     Assessment & Plan:   Principal Problem:   Necrotizing fasciitis (HCC) Active Problems:   AKI (acute kidney injury) (HCC)   Sepsis due to cellulitis (HCC)   Cellulitis of lower extremity   Anemia   Diabetic gastroparesis associated with type 1 diabetes mellitus (HCC)   DM type 1 (diabetes mellitus, type 1) (HCC)   Drug-seeking behavior   Sepsis (HCC)   Necrotizing fasciitis of  pelvic region and thigh (HCC)   Necrotizing fasciitis of lower leg (HCC)   MDD (major depressive disorder), recurrent episode, moderate (HCC)   Severe sepsis due to bilateral feet abscess, necrotizing fasciitis, POA.  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 -Initially underwent I&D on 8/21 with wound VAC change on 8/26 but ultimately required bilateral BKA by orthopedic on 9/6.  - back to the OR on 9/18 by Dr Lajoyce Corners for further I&D, repeat cultures sent. - back to OR on 9/19, Back to the OR again debridement, amputation revision. Several abscesses encountered.  9/18, culture with steno and Klebsiella, MDR ESBL and rare yeast 9/20, oral culture with Klebsiella and steno.  Final culture pending. On broad-spectrum antibiotics.  Currently on meropenem and vancomycin.  Followed by ID.  Hypotension with tachycardia Stable today. continuing Midodrine 5 mg 3 times daily.  Acute blood loss anemia due to operative blood loss IV iron and multiple units of PRBC during her stay.  1 additional unit of PRBC 9/20.  Responded appropriately.  Will continue to monitor.     Type 1 diabetes, poorly controlled with intermittent hypoglycemia Controlled currently on long-acting insulin and short acting insulin.  Continue current sliding scale and Accu-Chek.  Blood sugars are acceptable.   Acute kidney injury on CKD stage IIIa due to septic ATN, improving Non-anion gap metabolic acidosis Hyponatremia, euvolemic Resolved now back to baseline creatinine .  Peaked at 2.54   Severe protein calorie malnutrition Cortrak removed 9/9.  Encouraging oral diet, dietitian following.  Encourage oral diet Fortunately, she does not have  gastroparesis symptoms these days and able to eat regular diet.   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  Currently not having active symptoms.   Hypertension IV as needed   Depression -On Cymbalta at home, continued   Bilateral upper  extremity edema:  Advised to elevate extremities.   PT/OT-SNF, per TOC we have insurance authorization until 9/29 Surgical is not ready.    DVT prophylaxis: heparin   Code Status: Full Code  Family Communication: None.  Status is: Inpatient Remains inpatient appropriate because: More inpatient procedures planned.  IV antibiotics.  Subjective:  Patient seen and examined.  She has some swelling of the right forearm.  Denies any other complaints.  She slept well.  Pain is controlled.   Examination:  General: Fairly comfortable.  Alert awake and oriented.  Pleasant interaction. Cardiovascular: S1-S2 normal.  Regular rate rhythm. Respiratory: Bilateral clear.  No added sounds. Gastrointestinal: Soft.  Nontender.  Bowel sound present. Ext: Both lower extremity with below-knee amputation stump on wound VAC Both inner thigh incisions on wound VAC. Right forearm with mild generalized swelling, nontender.  PICC line right arm. Neuro: Alert and awake.     Pressure Injury 10/07/22 Elbow Posterior;Right Stage 2 -  Partial thickness loss of dermis presenting as a shallow open injury with a red, pink wound bed without slough. (Active)  10/07/22 1226  Location: Elbow  Location Orientation: Posterior;Right  Staging: Stage 2 -  Partial thickness loss of dermis presenting as a shallow open injury with a red, pink wound bed without slough.  Wound Description (Comments):   Present on Admission:      Diet Orders (From admission, onward)     Start     Ordered   11/07/22 1553  Diet Carb Modified Fluid consistency: Thin; Room service appropriate? Yes  Diet effective now       Question Answer Comment  Diet-HS Snack? Nothing   Calorie Level Medium 1600-2000   Fluid consistency: Thin   Room service appropriate? Yes      11/07/22 1552            Objective: Vitals:   11/08/22 2027 11/09/22 0035 11/09/22 0403 11/09/22 0809  BP:  127/80  110/68  Pulse: 90 94    Resp:    12  Temp:  98.1 F (36.7 C) 98.3 F (36.8 C) 98.6 F (37 C) 98 F (36.7 C)  TempSrc: Oral Oral Oral Oral  SpO2: 100% 100%    Weight:   103.7 kg   Height:        Intake/Output Summary (Last 24 hours) at 11/09/2022 1142 Last data filed at 11/09/2022 0600 Gross per 24 hour  Intake 400 ml  Output 2701 ml  Net -2301 ml   Filed Weights   11/07/22 0321 11/08/22 0507 11/09/22 0403  Weight: 104.9 kg 104.7 kg 103.7 kg    Scheduled Meds:  acetaminophen  1,000 mg Oral Q6H   vitamin C  1,000 mg Oral Daily   Chlorhexidine Gluconate Cloth  6 each Topical Q0600   docusate sodium  100 mg Oral BID   DULoxetine  30 mg Oral Daily   gabapentin  300 mg Oral Q8H   heparin injection (subcutaneous)  5,000 Units Subcutaneous Q8H   hydrocortisone cream   Topical TID   insulin aspart  0-5 Units Subcutaneous QHS   insulin aspart  0-9 Units Subcutaneous TID WC   methocarbamol  1,000 mg Oral TID   metoCLOPramide  10 mg Oral Q8H   metoprolol  tartrate  25 mg Oral BID   midodrine  5 mg Oral TID WC   multivitamin with minerals  1 tablet Oral Daily   nutrition supplement (JUVEN)  1 packet Oral BID BM   omeprazole  40 mg Oral Daily   silver nitrate applicators  1 Application Topical Once   sodium chloride flush  10-40 mL Intracatheter Q12H   Continuous Infusions:  sodium chloride 10 mL/hr at 11/06/22 1031   sodium chloride Stopped (11/06/22 0941)   meropenem (MERREM) IV 1 g (11/09/22 0641)   methocarbamol (ROBAXIN) IV     vancomycin 1,250 mg (11/08/22 1736)    Nutritional status Signs/Symptoms: estimated needs Interventions: Juven, Glucerna shake Body mass index is 35.81 kg/m.  Data Reviewed:   CBC: Recent Labs  Lab 11/05/22 0400 11/06/22 0221 11/06/22 1450 11/07/22 1900 11/08/22 0713 11/09/22 0030  WBC 44.9* 39.2*  --  44.6* 37.4* 36.4*  HGB 7.5* 6.4* 8.1* 6.3* 8.5* 8.0*  HCT 24.6* 20.3* 24.8* 19.1* 25.7* 24.6*  MCV 94.3 96.7  --  91.8 91.1 93.2  PLT 572* 496*  --  408* 223 446*   Basic  Metabolic Panel: Recent Labs  Lab 11/03/22 0329 11/04/22 1021 11/07/22 0425 11/07/22 1900 11/08/22 0713 11/09/22 0030  NA 131* 131* 132*  --  129* 130*  K 4.5 4.6 4.0  --  5.4* 4.9  CL 103 104 114*  --  104 104  CO2 21* 20* 18*  --  16* 19*  GLUCOSE 118* 118* 91  --  228* 198*  BUN 41* 33* 37*  --  45* 40*  CREATININE 1.26* 1.13* 1.28*  --  1.57* 1.38*  CALCIUM 7.4* 7.3* 6.5*  --  7.2* 7.2*  MG 1.8 1.6*  --  1.5*  --   --    GFR: Estimated Creatinine Clearance: 73.1 mL/min (A) (by C-G formula based on SCr of 1.38 mg/dL (H)). Liver Function Tests: No results for input(s): "AST", "ALT", "ALKPHOS", "BILITOT", "PROT", "ALBUMIN" in the last 168 hours. No results for input(s): "LIPASE", "AMYLASE" in the last 168 hours. No results for input(s): "AMMONIA" in the last 168 hours. Coagulation Profile: No results for input(s): "INR", "PROTIME" in the last 168 hours. Cardiac Enzymes: No results for input(s): "CKTOTAL", "CKMB", "CKMBINDEX", "TROPONINI" in the last 168 hours. BNP (last 3 results) No results for input(s): "PROBNP" in the last 8760 hours. HbA1C: No results for input(s): "HGBA1C" in the last 72 hours. CBG: Recent Labs  Lab 11/08/22 0558 11/08/22 1131 11/08/22 1705 11/08/22 2211 11/09/22 0640  GLUCAP 198* 259* 254* 189* 161*   Lipid Profile: No results for input(s): "CHOL", "HDL", "LDLCALC", "TRIG", "CHOLHDL", "LDLDIRECT" in the last 72 hours. Thyroid Function Tests: No results for input(s): "TSH", "T4TOTAL", "FREET4", "T3FREE", "THYROIDAB" in the last 72 hours. Anemia Panel: No results for input(s): "VITAMINB12", "FOLATE", "FERRITIN", "TIBC", "IRON", "RETICCTPCT" in the last 72 hours. Sepsis Labs: No results for input(s): "PROCALCITON", "LATICACIDVEN" in the last 168 hours.  Recent Results (from the past 240 hour(s))  MRSA Next Gen by PCR, Nasal     Status: None   Collection Time: 11/05/22  6:22 AM   Specimen: Nasal Mucosa; Nasal Swab  Result Value Ref Range  Status   MRSA by PCR Next Gen NOT DETECTED NOT DETECTED Final    Comment: (NOTE) The GeneXpert MRSA Assay (FDA approved for NASAL specimens only), is one component of a comprehensive MRSA colonization surveillance program. It is not intended to diagnose MRSA infection nor to guide or monitor  treatment for MRSA infections. Test performance is not FDA approved in patients less than 74 years old. Performed at [facility], [address], [place], [place] [address]   Aerobic/Anaerobic Culture w Gram Stain (surgical/deep wound)     Status: None (Preliminary result)   Collection Time: 11/05/22 11:46 AM   Specimen: PATH Cytology Misc. fluid; Body Fluid  Result Value Ref Range Status   Specimen Description FLUID LEFT KNEE EFFUSION  Final   Special Requests A  Final   Gram Stain   Final    ABUNDANT WBC PRESENT, PREDOMINANTLY PMN NO ORGANISMS SEEN    Culture   Final    NO GROWTH 4 DAYS NO ANAEROBES ISOLATED; CULTURE IN PROGRESS FOR 5 DAYS Performed at [facility], [address] [place], [place] [address]    Report Status PENDING  Incomplete  Aerobic/Anaerobic Culture w Gram Stain (surgical/deep wound)     Status: None (Preliminary result)   Collection Time: 11/05/22 11:49 AM   Specimen: Thigh; Wound  Result Value Ref Range Status   Specimen Description THIGH LEFT LATERAL  Final   Special Requests B  Final   Gram Stain   Final    ABUNDANT WBC PRESENT, PREDOMINANTLY PMN NO ORGANISMS SEEN Performed at [facility], [address], [place], [place] [address]    Culture   Final    RARE YEAST IDENTIFICATION TO FOLLOW NO ANAEROBES ISOLATED; CULTURE IN PROGRESS FOR 5 DAYS    Report Status PENDING  Incomplete  Aerobic/Anaerobic Culture w Gram Stain (surgical/deep wound)     Status: None (Preliminary result)   Collection Time: 11/05/22 11:51 AM   Specimen: Thigh; Wound  Result Value Ref Range Status   Specimen Description TISSUE THIGH LEFT MEDIAL  Final   Special  Requests TISSUE C  Final   Gram Stain   Final    ABUNDANT WBC PRESENT, PREDOMINANTLY PMN NO ORGANISMS SEEN Performed at [facility], [address], [place], [place] [address]    Culture   Final    RARE STENOTROPHOMONAS MALTOPHILIA NO ANAEROBES ISOLATED; CULTURE IN PROGRESS FOR 5 DAYS    Report Status PENDING  Incomplete   Organism ID, Bacteria STENOTROPHOMONAS MALTOPHILIA  Final      Susceptibility   Stenotrophomonas maltophilia - MIC*    LEVOFLOXACIN 0.5 SENSITIVE Sensitive     TRIMETH/SULFA <=20 SENSITIVE Sensitive     * RARE STENOTROPHOMONAS MALTOPHILIA  Aerobic/Anaerobic Culture w Gram Stain (surgical/deep wound)     Status: None (Preliminary result)   Collection Time: 11/05/22 11:55 AM   Specimen: Thigh; Wound  Result Value Ref Range Status   Specimen Description THIGH RIGHT MEDIAL  Final   Special Requests DIALYSIS  Final   Gram Stain   Final    ABUNDANT WBC PRESENT, PREDOMINANTLY PMN NO ORGANISMS SEEN    Culture   Final    FEW KLEBSIELLA PNEUMONIAE Confirmed Extended Spectrum Beta-Lactamase Producer (ESBL).  In bloodstream infections from ESBL organisms, carbapenems are preferred over piperacillin/tazobactam. They are shown to have a lower risk of mortality. FEW STENOTROPHOMONAS MALTOPHILIA CULTURE REINCUBATED FOR BETTER GROWTH NO ANAEROBES ISOLATED Performed at [facility], [address] [place], [place] [address]    Report Status PENDING  Incomplete   Organism ID, Bacteria KLEBSIELLA PNEUMONIAE  Final      Susceptibility   Klebsiella pneumoniae - MIC*    AMPICILLIN >=32 RESISTANT Resistant     CEFEPIME >=32 RESISTANT Resistant  CEFTAZIDIME RESISTANT Resistant     CEFTRIAXONE >=64 RESISTANT Resistant     CIPROFLOXACIN >=4 RESISTANT Resistant     GENTAMICIN >=16 RESISTANT Resistant     IMIPENEM <=0.25 SENSITIVE Sensitive     TRIMETH/SULFA >=320 RESISTANT Resistant     AMPICILLIN/SULBACTAM >=32 RESISTANT Resistant     PIP/TAZO 64 INTERMEDIATE  Intermediate     * FEW KLEBSIELLA PNEUMONIAE  Aerobic/Anaerobic Culture w Gram Stain (surgical/deep wound)     Status: None (Preliminary result)   Collection Time: 11/05/22 11:56 AM   Specimen: Thigh; Wound  Result Value Ref Range Status   Specimen Description THIGH RIGHT LATERAL  Final   Special Requests THIGH E  Final   Gram Stain   Final    ABUNDANT WBC PRESENT, PREDOMINANTLY PMN NO ORGANISMS SEEN    Culture   Final    NO GROWTH 4 DAYS NO ANAEROBES ISOLATED; CULTURE IN PROGRESS FOR 5 DAYS Performed at [facility], [address] [place], [place] [address]    Report Status PENDING  Incomplete  Culture, blood (Routine X 2) w Reflex to ID Panel     Status: None (Preliminary result)   Collection Time: 11/06/22  7:14 PM   Specimen: BLOOD  Result Value Ref Range Status   Specimen Description BLOOD SITE NOT SPECIFIED  Final   Special Requests   Final    BOTTLES DRAWN AEROBIC AND ANAEROBIC Blood Culture adequate volume   Culture   Final    NO GROWTH 3 DAYS Performed at [facility], [address] [place], [place] [address]    Report Status PENDING  Incomplete  Culture, blood (Routine X 2) w Reflex to ID Panel     Status: None (Preliminary result)   Collection Time: 11/06/22  7:14 PM   Specimen: BLOOD  Result Value Ref Range Status   Specimen Description BLOOD SITE NOT SPECIFIED  Final   Special Requests   Final    BOTTLES DRAWN AEROBIC AND ANAEROBIC Blood Culture adequate volume   Culture   Final    NO GROWTH 3 DAYS Performed at [facility], [address], [place], [place] [address]    Report Status PENDING  Incomplete  Aerobic/Anaerobic Culture w Gram Stain (surgical/deep wound)     Status: None (Preliminary result)   Collection Time: 11/07/22 12:00 PM   Specimen: Wound; Tissue  Result Value Ref Range Status   Specimen Description WOUND  Final   Special Requests medial left leg  Final   Gram Stain   Final    FEW WBC PRESENT, PREDOMINANTLY PMN NO  ORGANISMS SEEN Performed at [facility], [address] [place], [place] [address]    Culture RARE STENOTROPHOMONAS MALTOPHILIA  Final   Report Status PENDING  Incomplete  Aerobic/Anaerobic Culture w Gram Stain (surgical/deep wound)     Status: None (Preliminary result)   Collection Time: 11/07/22 12:18 PM   Specimen: Wound; Tissue  Result Value Ref Range Status   Specimen Description TISSUE  Final   Special Requests right BKA TISSUE  Final   Gram Stain   Final    FEW WBC PRESENT, PREDOMINANTLY PMN NO ORGANISMS SEEN Performed at [facility], [address], [place], [place] [address]    Culture   Final    RARE KLEBSIELLA PNEUMONIAE RARE STENOTROPHOMONAS MALTOPHILIA    Report Status PENDING  Incomplete  Aerobic/Anaerobic Culture w Gram Stain (surgical/deep wound)     Status: None (Preliminary result)  Collection Time: 11/07/22 12:27 PM   Specimen: Wound; Tissue  Result Value Ref Range Status   Specimen Description TISSUE  Final   Special Requests lateral left thigh  Final   Gram Stain   Final    RARE WBC PRESENT, PREDOMINANTLY PMN NO ORGANISMS SEEN    Culture   Final    RARE KLEBSIELLA PNEUMONIAE RARE STENOTROPHOMONAS MALTOPHILIA SUSCEPTIBILITIES TO FOLLOW Performed at [facility], [address] [place], [place] [address]    Report Status PENDING  Incomplete  Aerobic/Anaerobic Culture w Gram Stain (surgical/deep wound)     Status: None (Preliminary result)   Collection Time: 11/07/22 12:27 PM   Specimen: Wound; Tissue  Result Value Ref Range Status   Specimen Description TISSUE  Final   Special Requests left BKA stamp  Final   Gram Stain   Final    FEW WBC PRESENT,BOTH PMN AND MONONUCLEAR NO ORGANISMS SEEN Performed at [facility], [address], [place], [place] [address]    Culture RARE KLEBSIELLA PNEUMONIAE  Final   Report Status PENDING  Incomplete         Radiology Studies: No results found.         LOS: 37 days    Time spent= 35 mins

## 2022-11-09 NOTE — Plan of Care (Signed)
  Problem: Education: Goal: Knowledge of General Education information will improve Description: Including pain rating scale, medication(s)/side effects and non-pharmacologic comfort measures Outcome: Progressing   Problem: Clinical Measurements: Goal: Ability to maintain clinical measurements within normal limits will improve Outcome: Progressing Goal: Diagnostic test results will improve Outcome: Progressing Goal: Respiratory complications will improve Outcome: Progressing Goal: Cardiovascular complication will be avoided Outcome: Progressing   Problem: Nutrition: Goal: Adequate nutrition will be maintained Outcome: Progressing   Problem: Coping: Goal: Level of anxiety will decrease Outcome: Progressing   Problem: Elimination: Goal: Will not experience complications related to bowel motility Outcome: Progressing Goal: Will not experience complications related to urinary retention Outcome: Progressing   Problem: Safety: Goal: Ability to remain free from injury will improve Outcome: Progressing   Problem: Education: Goal: Knowledge of the prescribed therapeutic regimen will improve Outcome: Progressing Goal: Ability to verbalize activity precautions or restrictions will improve Outcome: Progressing   Problem: Self-Care: Goal: Ability to meet self-care needs will improve Outcome: Progressing   Problem: Self-Concept: Goal: Ability to maintain and perform role responsibilities to the fullest extent possible will improve Outcome: Progressing   Problem: Pain Management: Goal: Pain level will decrease with appropriate interventions Outcome: Progressing   Problem: Health Behavior/Discharge Planning: Goal: Ability to manage health-related needs will improve Outcome: Not Progressing   Problem: Clinical Measurements: Goal: Will remain free from infection Outcome: Not Progressing   Problem: Activity: Goal: Risk for activity intolerance will decrease Outcome: Not  Progressing   Problem: Pain Managment: Goal: General experience of comfort will improve Outcome: Not Progressing   Problem: Skin Integrity: Goal: Risk for impaired skin integrity will decrease Outcome: Not Progressing   Problem: Education: Goal: Understanding of discharge needs will improve Outcome: Not Progressing   Problem: Activity: Goal: Ability to perform//tolerate increased activity and mobilize with assistive devices will improve Outcome: Not Progressing   Problem: Clinical Measurements: Goal: Postoperative complications will be avoided or minimized Outcome: Not Progressing

## 2022-11-10 DIAGNOSIS — M726 Necrotizing fasciitis: Secondary | ICD-10-CM | POA: Diagnosis not present

## 2022-11-10 LAB — MAGNESIUM: Magnesium: 1.8 mg/dL (ref 1.7–2.4)

## 2022-11-10 LAB — AEROBIC/ANAEROBIC CULTURE W GRAM STAIN (SURGICAL/DEEP WOUND)
Culture: NO GROWTH
Culture: NO GROWTH

## 2022-11-10 LAB — CBC
HCT: 27.6 % — ABNORMAL LOW (ref 36.0–46.0)
Hemoglobin: 8.8 g/dL — ABNORMAL LOW (ref 12.0–15.0)
MCH: 30.3 pg (ref 26.0–34.0)
MCHC: 31.9 g/dL (ref 30.0–36.0)
MCV: 95.2 fL (ref 80.0–100.0)
Platelets: 565 10*3/uL — ABNORMAL HIGH (ref 150–400)
RBC: 2.9 MIL/uL — ABNORMAL LOW (ref 3.87–5.11)
RDW: 15 % (ref 11.5–15.5)
WBC: 35.1 10*3/uL — ABNORMAL HIGH (ref 4.0–10.5)
nRBC: 0 % (ref 0.0–0.2)

## 2022-11-10 LAB — GLUCOSE, CAPILLARY
Glucose-Capillary: 233 mg/dL — ABNORMAL HIGH (ref 70–99)
Glucose-Capillary: 211 mg/dL — ABNORMAL HIGH (ref 70–99)
Glucose-Capillary: 139 mg/dL — ABNORMAL HIGH (ref 70–99)
Glucose-Capillary: 155 mg/dL — ABNORMAL HIGH (ref 70–99)

## 2022-11-10 LAB — BASIC METABOLIC PANEL
Anion gap: 7 (ref 5–15)
BUN: 35 mg/dL — ABNORMAL HIGH (ref 6–20)
CO2: 19 mmol/L — ABNORMAL LOW (ref 22–32)
Calcium: 7.4 mg/dL — ABNORMAL LOW (ref 8.9–10.3)
Chloride: 105 mmol/L (ref 98–111)
Creatinine, Ser: 1.15 mg/dL — ABNORMAL HIGH (ref 0.44–1.00)
GFR, Estimated: >60 mL/min (ref >60)
Glucose, Bld: 229 mg/dL — ABNORMAL HIGH (ref 70–99)
Potassium: 5.1 mmol/L (ref 3.5–5.1)
Sodium: 131 mmol/L — ABNORMAL LOW (ref 135–145)

## 2022-11-10 MED ORDER — INSULIN ASPART 100 UNIT/ML IJ SOLN
0.0000 [IU] | Freq: Three times a day (TID) | INTRAMUSCULAR | Status: DC
  Administered 2022-11-11 – 2022-11-13 (×4): 1 [IU] via SUBCUTANEOUS
  Administered 2022-11-15: 4 [IU] via SUBCUTANEOUS
  Administered 2022-11-15: 3 [IU] via SUBCUTANEOUS
  Administered 2022-11-18 – 2022-11-22 (×4): 1 [IU] via SUBCUTANEOUS

## 2022-11-10 MED ORDER — SULFAMETHOXAZOLE-TRIMETHOPRIM 800-160 MG PO TABS
2.0000 | ORAL_TABLET | Freq: Two times a day (BID) | ORAL | Status: DC
  Administered 2022-11-10 – 2022-11-15 (×11): 2 via ORAL
  Filled 2022-11-10 (×11): qty 2

## 2022-11-10 MED ORDER — MINOCYCLINE HCL 50 MG PO CAPS
200.0000 mg | ORAL_CAPSULE | Freq: Two times a day (BID) | ORAL | Status: DC
  Administered 2022-11-10 – 2022-11-17 (×14): 200 mg via ORAL
  Filled 2022-11-10 (×6): qty 4
  Filled 2022-11-10: qty 2
  Filled 2022-11-10 (×9): qty 4
  Filled 2022-11-10: qty 2
  Filled 2022-11-10: qty 4

## 2022-11-10 MED ORDER — INSULIN ASPART 100 UNIT/ML IJ SOLN: 4.0000 [IU] | Freq: Three times a day (TID) | INTRAMUSCULAR | Status: DC

## 2022-11-10 MED ORDER — INSULIN ASPART 100 UNIT/ML IJ SOLN: 0.0000 [IU] | Freq: Every day | INTRAMUSCULAR | Status: DC

## 2022-11-10 MED ORDER — INSULIN GLARGINE-YFGN 100 UNIT/ML ~~LOC~~ SOLN
4.0000 [IU] | Freq: Two times a day (BID) | SUBCUTANEOUS | Status: DC
  Administered 2022-11-10 – 2022-11-22 (×23): 4 [IU] via SUBCUTANEOUS
  Filled 2022-11-10 (×27): qty 0.04

## 2022-11-10 MED ORDER — INSULIN GLARGINE-YFGN 100 UNIT/ML ~~LOC~~ SOLN
6.0000 [IU] | Freq: Two times a day (BID) | SUBCUTANEOUS | Status: DC
  Filled 2022-11-10 (×2): qty 0.06

## 2022-11-10 MED ORDER — FLUCONAZOLE 200 MG PO TABS
400.0000 mg | ORAL_TABLET | Freq: Every day | ORAL | Status: DC
  Administered 2022-11-10 – 2022-11-17 (×7): 400 mg via ORAL
  Filled 2022-11-10 (×8): qty 2

## 2022-11-10 NOTE — Progress Notes (Addendum)
[facility]    Date of Admission:  10/03/2022     ID: Lilyan Kolacz is a 32 y.o. female with polymicrobial necrotizing fasciitis s/p bilateral bka Principal Problem:   Necrotizing fasciitis (HCC) Active Problems:   Anemia   Sepsis (HCC)   AKI (acute kidney injury) (HCC)   Diabetic gastroparesis associated with type 1 diabetes mellitus (HCC)   DM type 1 (diabetes mellitus, type 1) (HCC)   Drug-seeking behavior   Sepsis due to cellulitis (HCC)   Cellulitis of lower extremity   Necrotizing fasciitis of pelvic region and thigh (HCC)   Necrotizing fasciitis of lower leg (HCC)   MDD (major depressive disorder), recurrent episode, moderate (HCC)   Effusion, left knee    Subjective: Afebrile, sleepy this afternoon.  Medications:   acetaminophen  1,000 mg Oral Q6H   vitamin C  1,000 mg Oral Daily   Chlorhexidine Gluconate Cloth  6 each Topical Q0600   docusate sodium  100 mg Oral BID   DULoxetine  30 mg Oral Daily   fluconazole  400 mg Oral Daily   gabapentin  300 mg Oral Q8H   heparin injection (subcutaneous)  5,000 Units Subcutaneous Q8H   hydrocortisone cream   Topical TID   insulin aspart  0-5 Units Subcutaneous QHS   insulin aspart  0-6 Units Subcutaneous TID WC   insulin glargine-yfgn  4 Units Subcutaneous BID   methocarbamol  1,000 mg Oral TID   metoCLOPramide  10 mg Oral Q8H   metoprolol tartrate  25 mg Oral BID   midodrine  5 mg Oral TID WC   minocycline  200 mg Oral BID   multivitamin with minerals  1 tablet Oral Daily   nutrition supplement (JUVEN)  1 packet Oral BID BM   omeprazole  40 mg Oral Daily   silver nitrate applicators  1 Application Topical Once   sodium chloride flush  10-40 mL Intracatheter Q12H   sulfamethoxazole-trimethoprim  2 tablet Oral Q12H    Objective: Vital signs in last 24 hours: Temp:  [92.2 F (33.4 C)-98.7 F (37.1 C)] 98.7 F (37.1 C) (09/23 1614) Pulse Rate:  [85-109] 85 (09/23 1115) Resp:   [12-17] 13 (09/23 1115) BP: (118-146)/(70-94) 146/94 (09/23 1614) SpO2:  [97 %-100 %] 100 % (09/23 1115) Weight:  [101.8 kg] 101.8 kg (09/23 0500) Physical Exam  Constitutional:  oriented to person, place, and time. appears well-developed and well-nourished. No distress.  HENT: [place]/AT, PERRLA, no scleral icterus Mouth/Throat: Oropharynx is clear and moist. No oropharyngeal exudate.  Cardiovascular: Normal rate, regular rhythm and normal heart sounds. Exam reveals no gallop and no friction rub.  No murmur heard.  Pulmonary/Chest: Effort normal and breath sounds normal. No respiratory distress.  has no wheezes.  Neck = supple, no nuchal rigidity Abdominal: Soft. Bowel sounds are normal.  exhibits no distension. There is no tenderness.  Ext: wound vac in place Lymphadenopathy: no cervical adenopathy. No axillary adenopathy Neurological: alert and oriented to person, place, and time.  Skin: Skin is warm and dry. No rash noted. No erythema.  Psychiatric: a normal mood and affect.  behavior is normal.    Lab Results Recent Labs    11/09/22 0030 11/10/22 0350  WBC 36.4* 35.1*  HGB 8.0* 8.8*  HCT 24.6* 27.6*  NA 130* 131*  K 4.9 5.1  CL 104 105  CO2 19* 19*  BUN 40* 35*  CREATININE 1.38* 1.15*   Lab Results  Component Value Date  ESRSEDRATE 84 (H) 10/31/2022    Microbiology: reviewed Studies/Results: No results found.   Assessment/Plan: Polymicrobial necrotizing fasciitis including esbl kleb, stenotrophomonas, and parapsilosis. - will change her abtx to cover steno - will continue with meropenem, add fluconazole, andd bactrim and minocycline. 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 Professional time spent  includes the following activities: Preparing to see the patient (review of tests), Obtaining and/or reviewing separately obtained history (admission/discharge record), Performing a medically appropriate examination and/or evaluation , Ordering medications/tests/procedures, referring and communicating with other health care professionals, Documenting clinical information in the EMR, Independently interpreting results (not separately reported), Communicating results to the patient/family/caregiver, Counseling and educating the patient/family/caregiver and Care coordination (not separately reported).      [facility] Pager: [phone]  11/10/2022, 6:23 PM

## 2022-11-10 NOTE — Progress Notes (Signed)
Patient ID: Kelli Hudson, female   DOB: 1991-01-18, 32 y.o.   MRN: [id number] Patient is status post repeat debridement on Friday.  Patient's IV antibiotics have been adjusted accordingly.  Her white cell count is still elevated but improved at 35,000 with hemoglobin stable at 8.8.  May consider repeat debridement this week if not showing continued improvement.

## 2022-11-10 NOTE — Progress Notes (Signed)
PROGRESS NOTE    Kelli Hudson  ZOX:[id number] DOB: [birth year] DOA: 10/03/2022 PCP: Norm Salt, PA    Brief Narrative:  32 y.o. female with medical history significant of DM1, diabetic gastroparesis with very frequent admission in the past, HTN presents to the hospital with bilateral foot wounds for about 2 weeks.  Not relieved with oral antibiotics.  She was admitted and treated with IV antibiotics but ultimately required extensive debridement and subsequent amputations for necrotizing fasciitis.   Chronology of events as below.  Since her surgery intermittently having anemia without any obvious signs of blood loss requiring PRBC transfusion, WBC still remains elevated but stable. Repeat CT showed persistent fluid collection, s/p OR 9/18, and again on 9/20 for repeat debridement.     Assessment & Plan:   Principal Problem:   Necrotizing fasciitis (HCC) Active Problems:   AKI (acute kidney injury) (HCC)   Sepsis due to cellulitis (HCC)   Cellulitis of lower extremity   Anemia   Diabetic gastroparesis associated with type 1 diabetes mellitus (HCC)   DM type 1 (diabetes mellitus, type 1) (HCC)   Drug-seeking behavior   Sepsis (HCC)   Necrotizing fasciitis of pelvic region and thigh (HCC)   Necrotizing fasciitis of lower leg (HCC)   MDD (major depressive disorder), recurrent episode, moderate (HCC)   Severe sepsis due to bilateral feet abscess, necrotizing fasciitis, POA.  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 -Initially underwent I&D on 8/21 with wound VAC change on 8/26 but ultimately required bilateral BKA by orthopedic on 9/6.  - back to the OR on 9/18 by Dr Lajoyce Corners for further I&D, repeat cultures sent. - back to OR on 9/19, Back to the OR again debridement, amputation revision. Several abscesses encountered.  9/18, culture with steno and Klebsiella, MDR ESBL and rare yeast 9/20, oral culture with Klebsiella and steno.  Final  culture pending. On broad-spectrum antibiotics.  Currently on meropenem and vancomycin.  Followed by ID.  Hypotension with tachycardia Stable today. continuing Midodrine 5 mg 3 times daily.  Acute blood loss anemia due to operative blood loss IV iron and multiple units of PRBC during her stay.  1 additional unit of PRBC 9/20.  Responded appropriately.  Will continue to monitor.     Type 1 diabetes, poorly controlled with intermittent hypoglycemia Blood sugars are elevated now.  Will start patient back on long-acting insulin 6 units twice daily, prandial insulin 4 units 3 times daily and continue sliding scale insulin.     Acute kidney injury on CKD stage IIIa due to septic ATN, improving Non-anion gap metabolic acidosis Hyponatremia, euvolemic Resolved now back to baseline creatinine .  Peaked at 2.54   Severe protein calorie malnutrition Encouraging oral diet, dietitian following.  Encourage oral diet Fortunately, she does not have gastroparesis symptoms these days and able to eat regular diet.   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  Currently not having active symptoms.   Hypertension IV as needed   Depression -On Cymbalta at home, continued   Some clinical improvement today.  Anticipate more surgical interventions before patient is ready for discharge.   DVT prophylaxis: heparin   Code Status: Full Code  Family Communication: None today.  Patient's mother updated frequently.  Status is: Inpatient Remains inpatient appropriate because: More inpatient procedures planned.  IV antibiotics.  Subjective:  Patient seen and examined.  Denies any complaints.  Pain is controlled.  Denies any nausea.  Excessive drainage on the wound vacs.   Examination:  General: Looks fairly comfortable. Cardiovascular: S1-S2 normal.  Regular rate rhythm. Respiratory: Bilateral clear.  No added sounds. Gastrointestinal: Soft.  Nontender.  Bowel sound present. Ext: Both lower  extremity with below-knee amputation stump on wound VAC Both inner thigh incisions on wound VAC. Right forearm with mild generalized swelling, nontender.  PICC line right arm. Neuro: Alert and awake.     Pressure Injury 10/07/22 Elbow Posterior;Right Stage 2 -  Partial thickness loss of dermis presenting as a shallow open injury with a red, pink wound bed without slough. (Active)  10/07/22 1226  Location: Elbow  Location Orientation: Posterior;Right  Staging: Stage 2 -  Partial thickness loss of dermis presenting as a shallow open injury with a red, pink wound bed without slough.  Wound Description (Comments):   Present on Admission:      Diet Orders (From admission, onward)     Start     Ordered   11/07/22 1553  Diet Carb Modified Fluid consistency: Thin; Room service appropriate? Yes  Diet effective now       Question Answer Comment  Diet-HS Snack? Nothing   Calorie Level Medium 1600-2000   Fluid consistency: Thin   Room service appropriate? Yes      11/07/22 1552            Objective: Vitals:   11/10/22 0340 11/10/22 0500 11/10/22 0824 11/10/22 0835  BP: 134/71  118/72   Pulse: 91  (!) 109 89  Resp: 17  12 12   Temp: 98.4 F (36.9 C)  98.7 F (37.1 C)   TempSrc: Oral  Oral   SpO2: 97%  98% 98%  Weight:  101.8 kg    Height:        Intake/Output Summary (Last 24 hours) at 11/10/2022 1037 Last data filed at 11/10/2022 0649 Gross per 24 hour  Intake --  Output 3950 ml  Net -3950 ml   Filed Weights   11/08/22 0507 11/09/22 0403 11/10/22 0500  Weight: 104.7 kg 103.7 kg 101.8 kg    Scheduled Meds:  acetaminophen  1,000 mg Oral Q6H   vitamin C  1,000 mg Oral Daily   Chlorhexidine Gluconate Cloth  6 each Topical Q0600   docusate sodium  100 mg Oral BID   DULoxetine  30 mg Oral Daily   fluconazole  400 mg Oral Daily   gabapentin  300 mg Oral Q8H   heparin injection (subcutaneous)  5,000 Units Subcutaneous Q8H   hydrocortisone cream   Topical TID    insulin aspart  0-5 Units Subcutaneous QHS   insulin aspart  0-9 Units Subcutaneous TID WC   insulin aspart  4 Units Subcutaneous TID WC   insulin glargine-yfgn  6 Units Subcutaneous BID   methocarbamol  1,000 mg Oral TID   metoCLOPramide  10 mg Oral Q8H   metoprolol tartrate  25 mg Oral BID   midodrine  5 mg Oral TID WC   multivitamin with minerals  1 tablet Oral Daily   nutrition supplement (JUVEN)  1 packet Oral BID BM   omeprazole  40 mg Oral Daily   silver nitrate applicators  1 Application Topical Once   sodium chloride flush  10-40 mL Intracatheter Q12H   Continuous Infusions:  sodium chloride 10 mL/hr at 11/06/22 1031   sodium chloride Stopped (11/06/22 0941)   meropenem (MERREM) IV 1 g (11/10/22 0512)   methocarbamol (ROBAXIN) IV     vancomycin 1,250 mg (11/09/22 1840)    Nutritional status Signs/Symptoms: estimated needs Interventions:  Juven, Glucerna shake Body mass index is 35.15 kg/m.  Data Reviewed:   CBC: Recent Labs  Lab 11/06/22 0221 11/06/22 1450 11/07/22 1900 11/08/22 0713 11/09/22 0030 11/10/22 0350  WBC 39.2*  --  44.6* 37.4* 36.4* 35.1*  HGB 6.4* 8.1* 6.3* 8.5* 8.0* 8.8*  HCT 20.3* 24.8* 19.1* 25.7* 24.6* 27.6*  MCV 96.7  --  91.8 91.1 93.2 95.2  PLT 496*  --  408* 223 446* 565*   Basic Metabolic Panel: Recent Labs  Lab 11/04/22 1021 11/07/22 0425 11/07/22 1900 11/08/22 0713 11/09/22 0030 11/10/22 0350  NA 131* 132*  --  129* 130* 131*  K 4.6 4.0  --  5.4* 4.9 5.1  CL 104 114*  --  104 104 105  CO2 20* 18*  --  16* 19* 19*  GLUCOSE 118* 91  --  228* 198* 229*  BUN 33* 37*  --  45* 40* 35*  CREATININE 1.13* 1.28*  --  1.57* 1.38* 1.15*  CALCIUM 7.3* 6.5*  --  7.2* 7.2* 7.4*  MG 1.6*  --  1.5*  --   --  1.8   GFR: Estimated Creatinine Clearance: 86.9 mL/min (A) (by C-G formula based on SCr of 1.15 mg/dL (H)). Liver Function Tests: No results for input(s): "AST", "ALT", "ALKPHOS", "BILITOT", "PROT", "ALBUMIN" in the last 168  hours. No results for input(s): "LIPASE", "AMYLASE" in the last 168 hours. No results for input(s): "AMMONIA" in the last 168 hours. Coagulation Profile: No results for input(s): "INR", "PROTIME" in the last 168 hours. Cardiac Enzymes: No results for input(s): "CKTOTAL", "CKMB", "CKMBINDEX", "TROPONINI" in the last 168 hours. BNP (last 3 results) No results for input(s): "PROBNP" in the last 8760 hours. HbA1C: No results for input(s): "HGBA1C" in the last 72 hours. CBG: Recent Labs  Lab 11/09/22 0640 11/09/22 1150 11/09/22 1646 11/09/22 2038 11/10/22 0638  GLUCAP 161* 208* 215* 160* 233*   Lipid Profile: No results for input(s): "CHOL", "HDL", "LDLCALC", "TRIG", "CHOLHDL", "LDLDIRECT" in the last 72 hours. Thyroid Function Tests: No results for input(s): "TSH", "T4TOTAL", "FREET4", "T3FREE", "THYROIDAB" in the last 72 hours. Anemia Panel: No results for input(s): "VITAMINB12", "FOLATE", "FERRITIN", "TIBC", "IRON", "RETICCTPCT" in the last 72 hours. Sepsis Labs: No results for input(s): "PROCALCITON", "LATICACIDVEN" in the last 168 hours.  Recent Results (from the past 240 hour(s))  MRSA Next Gen by PCR, Nasal     Status: None   Collection Time: 11/05/22  6:22 AM   Specimen: Nasal Mucosa; Nasal Swab  Result Value Ref Range Status   MRSA by PCR Next Gen NOT DETECTED NOT DETECTED Final    Comment: (NOTE) The GeneXpert MRSA Assay (FDA approved for NASAL specimens only), is one component of a comprehensive MRSA colonization surveillance program. It is not intended to diagnose MRSA infection nor to guide or monitor treatment for MRSA infections. Test performance is not FDA approved in patients less than 29 years old. Performed at [facility], [address], [place], [place] [address]   Aerobic/Anaerobic Culture w Gram Stain (surgical/deep wound)     Status: None (Preliminary result)   Collection Time: 11/05/22 11:46 AM   Specimen: PATH Cytology Misc. fluid; Body Fluid   Result Value Ref Range Status   Specimen Description FLUID LEFT KNEE EFFUSION  Final   Special Requests A  Final   Gram Stain   Final    ABUNDANT WBC PRESENT, PREDOMINANTLY PMN NO ORGANISMS SEEN    Culture   Final    NO  GROWTH 4 DAYS NO ANAEROBES ISOLATED; CULTURE IN PROGRESS FOR 5 DAYS Performed at [facility], [address], [place], [place] [address]    Report Status PENDING  Incomplete  Aerobic/Anaerobic Culture w Gram Stain (surgical/deep wound)     Status: None (Preliminary result)   Collection Time: 11/05/22 11:49 AM   Specimen: Thigh; Wound  Result Value Ref Range Status   Specimen Description THIGH LEFT LATERAL  Final   Special Requests B  Final   Gram Stain   Final    ABUNDANT WBC PRESENT, PREDOMINANTLY PMN NO ORGANISMS SEEN Performed at [facility], [address], [place], [place] [address]    Culture   Final    RARE CANDIDA PARAPSILOSIS NO ANAEROBES ISOLATED; CULTURE IN PROGRESS FOR 5 DAYS    Report Status PENDING  Incomplete  Aerobic/Anaerobic Culture w Gram Stain (surgical/deep wound)     Status: None (Preliminary result)   Collection Time: 11/05/22 11:51 AM   Specimen: Thigh; Wound  Result Value Ref Range Status   Specimen Description TISSUE THIGH LEFT MEDIAL  Final   Special Requests TISSUE C  Final   Gram Stain   Final    ABUNDANT WBC PRESENT, PREDOMINANTLY PMN NO ORGANISMS SEEN Performed at [facility], [address], [place], [place] [address]    Culture   Final    RARE STENOTROPHOMONAS MALTOPHILIA NO ANAEROBES ISOLATED; CULTURE IN PROGRESS FOR 5 DAYS    Report Status PENDING  Incomplete   Organism ID, Bacteria STENOTROPHOMONAS MALTOPHILIA  Final      Susceptibility   Stenotrophomonas maltophilia - MIC*    LEVOFLOXACIN 0.5 SENSITIVE Sensitive     TRIMETH/SULFA <=20 SENSITIVE Sensitive     * RARE STENOTROPHOMONAS MALTOPHILIA  Aerobic/Anaerobic Culture w Gram Stain (surgical/deep wound)     Status: None   Collection Time:  11/05/22 11:55 AM   Specimen: Thigh; Wound  Result Value Ref Range Status   Specimen Description THIGH RIGHT MEDIAL  Final   Special Requests DIALYSIS  Final   Gram Stain   Final    ABUNDANT WBC PRESENT, PREDOMINANTLY PMN NO ORGANISMS SEEN    Culture   Final    FEW KLEBSIELLA PNEUMONIAE Confirmed Extended Spectrum Beta-Lactamase Producer (ESBL).  In bloodstream infections from ESBL organisms, carbapenems are preferred over piperacillin/tazobactam. They are shown to have a lower risk of mortality. FEW STENOTROPHOMONAS MALTOPHILIA NO ANAEROBES ISOLATED Performed at [facility], [address], [place], [place] [address]    Report Status 11/10/2022 FINAL  Final   Organism ID, Bacteria KLEBSIELLA PNEUMONIAE  Final   Organism ID, Bacteria STENOTROPHOMONAS MALTOPHILIA  Final      Susceptibility   Klebsiella pneumoniae - MIC*    AMPICILLIN >=32 RESISTANT Resistant     CEFEPIME >=32 RESISTANT Resistant     CEFTAZIDIME RESISTANT Resistant     CEFTRIAXONE >=64 RESISTANT Resistant     CIPROFLOXACIN >=4 RESISTANT Resistant     GENTAMICIN >=16 RESISTANT Resistant     IMIPENEM <=0.25 SENSITIVE Sensitive     TRIMETH/SULFA >=320 RESISTANT Resistant     AMPICILLIN/SULBACTAM >=32 RESISTANT Resistant     PIP/TAZO 64 INTERMEDIATE Intermediate     * FEW KLEBSIELLA PNEUMONIAE   Stenotrophomonas maltophilia - MIC*    LEVOFLOXACIN 0.5 SENSITIVE Sensitive     TRIMETH/SULFA <=20 SENSITIVE Sensitive     * FEW STENOTROPHOMONAS MALTOPHILIA  Aerobic/Anaerobic Culture w Gram Stain (surgical/deep wound)     Status: None (Preliminary result)  Collection Time: 11/05/22 11:56 AM   Specimen: Thigh; Wound  Result Value Ref Range Status   Specimen Description THIGH RIGHT LATERAL  Final   Special Requests THIGH E  Final   Gram Stain   Final    ABUNDANT WBC PRESENT, PREDOMINANTLY PMN NO ORGANISMS SEEN    Culture   Final    NO GROWTH 4 DAYS NO ANAEROBES ISOLATED; CULTURE IN PROGRESS FOR 5  DAYS Performed at [facility], [address], [place], [place] [address]    Report Status PENDING  Incomplete  Culture, blood (Routine X 2) w Reflex to ID Panel     Status: None (Preliminary result)   Collection Time: 11/06/22  7:14 PM   Specimen: BLOOD  Result Value Ref Range Status   Specimen Description BLOOD SITE NOT SPECIFIED  Final   Special Requests   Final    BOTTLES DRAWN AEROBIC AND ANAEROBIC Blood Culture adequate volume   Culture   Final    NO GROWTH 4 DAYS Performed at [facility], [address] [place], [place] [address]    Report Status PENDING  Incomplete  Culture, blood (Routine X 2) w Reflex to ID Panel     Status: None (Preliminary result)   Collection Time: 11/06/22  7:14 PM   Specimen: BLOOD  Result Value Ref Range Status   Specimen Description BLOOD SITE NOT SPECIFIED  Final   Special Requests   Final    BOTTLES DRAWN AEROBIC AND ANAEROBIC Blood Culture adequate volume   Culture   Final    NO GROWTH 4 DAYS Performed at [facility], [address] [place], [place] [address]    Report Status PENDING  Incomplete  Aerobic/Anaerobic Culture w Gram Stain (surgical/deep wound)     Status: None (Preliminary result)   Collection Time: 11/07/22 12:00 PM   Specimen: Wound; Tissue  Result Value Ref Range Status   Specimen Description WOUND  Final   Special Requests medial left leg  Final   Gram Stain   Final    FEW WBC PRESENT, PREDOMINANTLY PMN NO ORGANISMS SEEN Performed at [facility], [address] [place], [place] [address]    Culture   Final    RARE STENOTROPHOMONAS MALTOPHILIA NO ANAEROBES ISOLATED; CULTURE IN PROGRESS FOR 5 DAYS    Report Status PENDING  Incomplete  Aerobic/Anaerobic Culture w Gram Stain (surgical/deep wound)     Status: None (Preliminary result)   Collection Time: 11/07/22 12:18 PM   Specimen: Wound; Tissue  Result Value Ref Range Status   Specimen Description TISSUE  Final   Special Requests right  BKA TISSUE  Final   Gram Stain   Final    FEW WBC PRESENT, PREDOMINANTLY PMN NO ORGANISMS SEEN Performed at [facility], [address] [place], [place] [address]    Culture   Final    RARE KLEBSIELLA PNEUMONIAE RARE STENOTROPHOMONAS MALTOPHILIA NO ANAEROBES ISOLATED; CULTURE IN PROGRESS FOR 5 DAYS    Report Status PENDING  Incomplete  Aerobic/Anaerobic Culture w Gram Stain (surgical/deep wound)     Status: None (Preliminary result)   Collection Time: 11/07/22 12:27 PM   Specimen: Wound; Tissue  Result Value Ref Range Status   Specimen Description TISSUE  Final   Special Requests lateral left thigh  Final   Gram Stain   Final    RARE WBC PRESENT, PREDOMINANTLY PMN NO ORGANISMS SEEN Performed at [facility], [address],  [place], [place] [address]    Culture   Final    RARE KLEBSIELLA PNEUMONIAE RARE STENOTROPHOMONAS MALTOPHILIA REPEATING SUSCEPTIBILITY FOR KLEB PNEUMO NO ANAEROBES ISOLATED; CULTURE IN PROGRESS FOR 5 DAYS    Report Status PENDING  Incomplete   Organism ID, Bacteria STENOTROPHOMONAS MALTOPHILIA  Final      Susceptibility   Stenotrophomonas maltophilia - MIC*    LEVOFLOXACIN 0.5 SENSITIVE Sensitive     TRIMETH/SULFA <=20 SENSITIVE Sensitive     * RARE STENOTROPHOMONAS MALTOPHILIA  Aerobic/Anaerobic Culture w Gram Stain (surgical/deep wound)     Status: None (Preliminary result)   Collection Time: 11/07/22 12:27 PM   Specimen: Wound; Tissue  Result Value Ref Range Status   Specimen Description TISSUE  Final   Special Requests left BKA stamp  Final   Gram Stain   Final    FEW WBC PRESENT,BOTH PMN AND MONONUCLEAR NO ORGANISMS SEEN Performed at [facility], [address], [place], [place] [address]    Culture   Final    RARE KLEBSIELLA PNEUMONIAE NO ANAEROBES ISOLATED; CULTURE IN PROGRESS FOR 5 DAYS    Report Status PENDING  Incomplete         Radiology Studies: No results found.         LOS: 38 days   Time  spent= 35 mins

## 2022-11-10 NOTE — Progress Notes (Signed)
Physical Therapy Treatment Patient Details Name: Kelli Hudson MRN: [id number] DOB: 1990/09/13 Today's Date: 11/10/2022   History of Present Illness Patient is 32 y.o. female presented to hospital 10/03/22 for bil foot wounds, fever, and pain for ~2 weeks with no improvement with antibiotics. Pt admitted for sepsis with necrotizing fasciitis/gangrene bilateral feet elected for surgical management. Pt underwent excision and debridement of BLE extending feet to thighs on 10/08/22 and remained intubated in ICU. 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 PMH significant for DM, HTN, gastric pacemaker.    PT Comments  Pt received in supine and agreeable to session. Pt requires increased time to complete tasks due to pain, however pt is motivated to improve mobility. Focus on lateral scooting at EOB to prepare for transfers and increase BUE strength. Pt able to minimally elevate bottom, however requires up to max A with the bedpad to advance bottom. Pt continues to benefit from PT services to progress toward functional mobility goals.    If plan is discharge home, recommend the following: Two people to help with walking and/or transfers;Two people to help with bathing/dressing/bathroom;Assistance with cooking/housework;Assist for transportation;Help with stairs or ramp for entrance;Supervision due to cognitive status   Can travel by private vehicle     No  Equipment Recommendations  Wheelchair (measurements PT);Wheelchair cushion (measurements PT);Other (comment)    Recommendations for Other Services       Precautions / Restrictions Precautions Precautions: Fall Precaution Comments: bil wound vac Restrictions Weight Bearing Restrictions: Yes RLE Weight Bearing: Non weight bearing LLE Weight Bearing: Non weight bearing Other Position/Activity Restrictions: bil BKA     Mobility  Bed Mobility Overal bed mobility: Needs Assistance Bed Mobility: Supine to Sit, Sit to  Supine     Supine to sit: Min assist, HOB elevated Sit to supine: Contact guard assist, Used rails, HOB elevated   General bed mobility comments: Increased time and small movements due to pain. assist with bedpad to scoot forward to EOB    Transfers Overall transfer level: Needs assistance Equipment used:  (bedpad)              Lateral/Scoot Transfers: Max assist General transfer comment: Pt able to lateral scoot L/R at EOB with max A with bedpad. Cues for technique and head-hips relationship. pt completing very small scoots due to pain and weakness       Balance Overall balance assessment: Needs assistance Sitting-balance support: Feet unsupported, Bilateral upper extremity supported Sitting balance-Leahy Scale: Fair Sitting balance - Comments: sitting EOB with slight posterior lean at times, but pt able to correct without assist                                    Cognition Arousal: Alert Behavior During Therapy: WFL for tasks assessed/performed Overall Cognitive Status: Within Functional Limits for tasks assessed                                          Exercises      General Comments        Pertinent Vitals/Pain Pain Assessment Pain Assessment: Faces Faces Pain Scale: Hurts whole lot Pain Location: BLE Pain Descriptors / Indicators: Grimacing, Tender, Aching, Guarding, Moaning Pain Intervention(s): Limited activity within patient's tolerance, Monitored during session, Repositioned     PT Goals (current  goals can now be found in the care plan section) Acute Rehab PT Goals Patient Stated Goal: regain independence and ability to walk with prosthetics PT Goal Formulation: With patient Time For Goal Achievement: 11/08/22 Progress towards PT goals: Progressing toward goals    Frequency    Min 1X/week       AM-PAC PT "6 Clicks" Mobility   Outcome Measure  Help needed turning from your back to your side while in a flat  bed without using bedrails?: A Little Help needed moving from lying on your back to sitting on the side of a flat bed without using bedrails?: A Lot Help needed moving to and from a bed to a chair (including a wheelchair)?: Total Help needed standing up from a chair using your arms (e.g., wheelchair or bedside chair)?: Total Help needed to walk in hospital room?: Total Help needed climbing 3-5 steps with a railing? : Total 6 Click Score: 9    End of Session   Activity Tolerance: Patient limited by pain;Patient tolerated treatment well Patient left: in bed;with call bell/phone within reach;with family/visitor present Nurse Communication: Mobility status PT Visit Diagnosis: Muscle weakness (generalized) (M62.81);Difficulty in walking, not elsewhere classified (R26.2);Other symptoms and signs involving the nervous system (R29.898);Other abnormalities of gait and mobility (R26.89);Unsteadiness on feet (R26.81);Pain     Time: 6213-0865 PT Time Calculation (min) (ACUTE ONLY): 32 min  Charges:    $Therapeutic Activity: 23-37 mins PT General Charges $$ ACUTE PT VISIT: 1 Visit                     Johny Shock, PTA Acute Rehabilitation Services Secure Chat Preferred  [phone]    Johny Shock 11/10/2022, 4:04 PM

## 2022-11-10 NOTE — Plan of Care (Signed)
  Problem: Education: Goal: Knowledge of General Education information will improve Description: Including pain rating scale, medication(s)/side effects and non-pharmacologic comfort measures Outcome: Progressing   Problem: Health Behavior/Discharge Planning: Goal: Ability to manage health-related needs will improve Outcome: Progressing   Problem: Clinical Measurements: Goal: Respiratory complications will improve Outcome: Progressing Goal: Cardiovascular complication will be avoided Outcome: Progressing   Problem: Nutrition: Goal: Adequate nutrition will be maintained Outcome: Progressing   Problem: Coping: Goal: Level of anxiety will decrease Outcome: Progressing   Problem: Elimination: Goal: Will not experience complications related to bowel motility Outcome: Progressing Goal: Will not experience complications related to urinary retention Outcome: Progressing   Problem: Pain Managment: Goal: General experience of comfort will improve Outcome: Progressing   Problem: Safety: Goal: Ability to remain free from injury will improve Outcome: Progressing   Problem: Education: Goal: Knowledge of the prescribed therapeutic regimen will improve Outcome: Progressing   Problem: Self-Care: Goal: Ability to meet self-care needs will improve Outcome: Progressing   Problem: Self-Concept: Goal: Ability to maintain and perform role responsibilities to the fullest extent possible will improve Outcome: Progressing   Problem: Pain Management: Goal: Pain level will decrease with appropriate interventions Outcome: Progressing   Problem: Clinical Measurements: Goal: Ability to maintain clinical measurements within normal limits will improve Outcome: Not Progressing Goal: Will remain free from infection Outcome: Not Progressing Goal: Diagnostic test results will improve Outcome: Not Progressing   Problem: Activity: Goal: Risk for activity intolerance will decrease Outcome: Not  Progressing   Problem: Skin Integrity: Goal: Risk for impaired skin integrity will decrease Outcome: Not Progressing   Problem: Education: Goal: Ability to verbalize activity precautions or restrictions will improve Outcome: Not Progressing Goal: Understanding of discharge needs will improve Outcome: Not Progressing   Problem: Activity: Goal: Ability to perform//tolerate increased activity and mobilize with assistive devices will improve Outcome: Not Progressing   Problem: Clinical Measurements: Goal: Postoperative complications will be avoided or minimized Outcome: Not Progressing   Problem: Skin Integrity: Goal: Demonstration of wound healing without infection will improve Outcome: Not Progressing

## 2022-11-10 NOTE — Inpatient Diabetes Management (Signed)
Inpatient Diabetes Program Recommendations  AACE/ADA: New Consensus Statement on Inpatient Glycemic Control (2015)  Target Ranges:  Prepandial:   less than 140 mg/dL      Peak postprandial:   less than 180 mg/dL (1-2 hours)      Critically ill patients:  140 - 180 mg/dL   Lab Results  Component Value Date   GLUCAP 233 (H) 11/10/2022   HGBA1C 8.8 (A) 06/18/2022    Latest Reference Range & Units 11/09/22 06:40 11/09/22 11:50 11/09/22 16:46 11/09/22 20:38 11/10/22 06:38  Glucose-Capillary 70 - 99 mg/dL 119 (H) 147 (H) 829 (H) 160 (H) 233 (H)  (H): Data is abnormally high  Diabetes history: DM 2 Outpatient Diabetes medications: Levemir 8 units bid, Novolin R 1-20 units 4x/day SSI Current orders for Inpatient glycemic control:  Semglee 6 units bid Novolog 4 units tid meal coverage Novolog 0-9 units tid + hs 0-5  Inpatient Diabetes Program Recommendations:   Patient has received total of Novolog 9 units over the past 24 hrs and no basal. Please consider: -Decrease Semglee to  4 units daily -Discontinue Novolog meal coverage -Decrease Novolog correction to 0-6 units tid, 0-5 units hs  Thank you, Darel Hong E. Zaniel Marineau, RN, MSN, CDE  Diabetes Coordinator Inpatient Glycemic Control Team Team Pager [phone] (8am-5pm) 11/10/2022 12:06 PM

## 2022-11-11 DIAGNOSIS — M726 Necrotizing fasciitis: Secondary | ICD-10-CM | POA: Diagnosis not present

## 2022-11-11 LAB — CBC
HCT: 27.5 % — ABNORMAL LOW (ref 36.0–46.0)
Hemoglobin: 8.7 g/dL — ABNORMAL LOW (ref 12.0–15.0)
MCH: 29.5 pg (ref 26.0–34.0)
MCHC: 31.6 g/dL (ref 30.0–36.0)
MCV: 93.2 fL (ref 80.0–100.0)
Platelets: 622 10*3/uL — ABNORMAL HIGH (ref 150–400)
RBC: 2.95 MIL/uL — ABNORMAL LOW (ref 3.87–5.11)
RDW: 15 % (ref 11.5–15.5)
WBC: 33.5 10*3/uL — ABNORMAL HIGH (ref 4.0–10.5)
nRBC: 0 % (ref 0.0–0.2)

## 2022-11-11 LAB — BASIC METABOLIC PANEL
Anion gap: 5 (ref 5–15)
BUN: 36 mg/dL — ABNORMAL HIGH (ref 6–20)
CO2: 20 mmol/L — ABNORMAL LOW (ref 22–32)
Calcium: 7.3 mg/dL — ABNORMAL LOW (ref 8.9–10.3)
Chloride: 101 mmol/L (ref 98–111)
Creatinine, Ser: 1.07 mg/dL — ABNORMAL HIGH (ref 0.44–1.00)
GFR, Estimated: >60 mL/min (ref >60)
Glucose, Bld: 162 mg/dL — ABNORMAL HIGH (ref 70–99)
Potassium: 5.1 mmol/L (ref 3.5–5.1)
Sodium: 126 mmol/L — ABNORMAL LOW (ref 135–145)

## 2022-11-11 LAB — CULTURE, BLOOD (ROUTINE X 2)
Culture: NO GROWTH
Culture: NO GROWTH
Special Requests: ADEQUATE
Special Requests: ADEQUATE

## 2022-11-11 LAB — GLUCOSE, CAPILLARY
Glucose-Capillary: 133 mg/dL — ABNORMAL HIGH (ref 70–99)
Glucose-Capillary: 135 mg/dL — ABNORMAL HIGH (ref 70–99)
Glucose-Capillary: 156 mg/dL — ABNORMAL HIGH (ref 70–99)
Glucose-Capillary: 130 mg/dL — ABNORMAL HIGH (ref 70–99)
Glucose-Capillary: 115 mg/dL — ABNORMAL HIGH (ref 70–99)

## 2022-11-11 NOTE — Progress Notes (Signed)
Patient ID: Kelli Hudson, female   DOB: 1990/07/22, 32 y.o.   MRN: [id number] Patient is status post repeat debridement for necrotizing fasciitis with current multiple infections with ESBL Klebsiella.  White cell count is decreased a little to 33,000.  Will plan for repeat debridement on Wednesday.

## 2022-11-11 NOTE — H&P (View-Only) (Signed)
Patient ID: Kelli Hudson, female   DOB: 1990/07/22, 32 y.o.   MRN: [id number] Patient is status post repeat debridement for necrotizing fasciitis with current multiple infections with ESBL Klebsiella.  White cell count is decreased a little to 33,000.  Will plan for repeat debridement on Wednesday.

## 2022-11-11 NOTE — Progress Notes (Signed)
PROGRESS NOTE    Kelli Hudson  WUJ:[id number] DOB: 03-Jul-1990 DOA: 10/03/2022 PCP: Norm Salt, PA    Brief Narrative:  32 y.o. female with medical history significant of DM1, diabetic gastroparesis with very frequent admission in the past, HTN presents to the hospital with bilateral foot wounds for about 2 weeks.  Not relieved with oral antibiotics.  She was admitted and treated with IV antibiotics but ultimately required extensive debridement and subsequent amputations for necrotizing fasciitis.   Chronology of events as below.  Since her surgery intermittently having anemia without any obvious signs of blood loss requiring PRBC transfusion, WBC still remains elevated but stable. Repeat CT showed persistent fluid collection, s/p OR 9/18, and again on 9/20 for repeat debridement.  Polymicrobial infection.    Assessment & Plan:   Principal Problem:   Necrotizing fasciitis (HCC) Active Problems:   AKI (acute kidney injury) (HCC)   Sepsis due to cellulitis (HCC)   Cellulitis of lower extremity   Anemia   Diabetic gastroparesis associated with type 1 diabetes mellitus (HCC)   DM type 1 (diabetes mellitus, type 1) (HCC)   Drug-seeking behavior   Sepsis (HCC)   Necrotizing fasciitis of pelvic region and thigh (HCC)   Necrotizing fasciitis of lower leg (HCC)   MDD (major depressive disorder), recurrent episode, moderate (HCC)   Severe sepsis due to bilateral feet abscess, necrotizing fasciitis, POA.  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 -Initially underwent I&D on 8/21 with wound VAC change on 8/26 but ultimately required bilateral BKA by orthopedic on 9/6.  - back to the OR on 9/18 by Dr Lajoyce Corners for further I&D, repeat cultures sent. - back to OR on 9/19, Back to the OR again debridement, amputation revision. Several abscesses encountered.  9/18, culture with steno and Klebsiella, MDR ESBL and rare yeast 9/20, surgical culture with  Klebsiella and steno.   On multiple antibiotics.  Currently on meropenem, Diflucan and Bactrim. Followed by ID. Plan for repeat washout /debride tomorrow by Dr. Lajoyce Corners.  Hypotension with tachycardia Stable today. continuing Midodrine 5 mg 3 times daily.  Acute blood loss anemia due to operative blood loss IV iron and multiple units of PRBC during her stay.  1 additional unit of PRBC 9/20.  Responded appropriately.  Will continue to monitor.   Hemoglobin 8.7 today.   Type 1 diabetes, poorly controlled with intermittent hypoglycemia Blood sugars are controlled on long-acting insulin 4 units twice daily, sliding scale insulin.  Avoid hypoglycemia.   Acute kidney injury on CKD stage IIIa due to septic ATN, improving Non-anion gap metabolic acidosis Hyponatremia, euvolemic Resolved now back to baseline creatinine .  Peaked at 2.54   Severe protein calorie malnutrition Encouraging oral diet, dietitian following.  Encourage oral diet Fortunately, she does not have gastroparesis symptoms these days and able to eat regular diet.   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  Currently not having active symptoms.   Hypertension IV as needed   Depression -On Cymbalta at home, continued    DVT prophylaxis: heparin   Code Status: Full Code  Family Communication: None today.    Status is: Inpatient Remains inpatient appropriate because: More inpatient procedures planned.  IV antibiotics.  Subjective:  Patient seen and examined.  No overnight events.  Aware about surgical procedure tomorrow.   Examination:  General: Looks comfortable.  Interactive. Cardiovascular: S1-S2 normal.  Regular rate rhythm. Respiratory: Bilateral clear.  No added sounds. Gastrointestinal: Soft.  Nontender.  Bowel sound present. Ext: Both  lower extremity with below-knee amputation stump on wound VAC Both inner thigh incisions on wound VAC. PICC line right arm. Neuro: Alert and  awake.     Pressure Injury 10/07/22 Elbow Posterior;Right Stage 2 -  Partial thickness loss of dermis presenting as a shallow open injury with a red, pink wound bed without slough. (Active)  10/07/22 1226  Location: Elbow  Location Orientation: Posterior;Right  Staging: Stage 2 -  Partial thickness loss of dermis presenting as a shallow open injury with a red, pink wound bed without slough.  Wound Description (Comments):   Present on Admission:      Diet Orders (From admission, onward)     Start     Ordered   11/07/22 1553  Diet Carb Modified Fluid consistency: Thin; Room service appropriate? Yes  Diet effective now       Question Answer Comment  Diet-HS Snack? Nothing   Calorie Level Medium 1600-2000   Fluid consistency: Thin   Room service appropriate? Yes      11/07/22 1552            Objective: Vitals:   11/10/22 1916 11/10/22 2325 11/11/22 0331 11/11/22 0716  BP: 130/77 131/82 129/78 (!) 132/97  Pulse:  90 90 95  Resp: 15 15    Temp:  98.5 F (36.9 C) 98 F (36.7 C) 97.8 F (36.6 C)  TempSrc:  Oral Oral Oral  SpO2:  91% 96% 95%  Weight:   100 kg   Height:        Intake/Output Summary (Last 24 hours) at 11/11/2022 1113 Last data filed at 11/11/2022 4098 Gross per 24 hour  Intake 1680.06 ml  Output 3400 ml  Net -1719.94 ml   Filed Weights   11/09/22 0403 11/10/22 0500 11/11/22 0331  Weight: 103.7 kg 101.8 kg 100 kg    Scheduled Meds:  acetaminophen  1,000 mg Oral Q6H   vitamin C  1,000 mg Oral Daily   Chlorhexidine Gluconate Cloth  6 each Topical Q0600   docusate sodium  100 mg Oral BID   DULoxetine  30 mg Oral Daily   fluconazole  400 mg Oral Daily   gabapentin  300 mg Oral Q8H   heparin injection (subcutaneous)  5,000 Units Subcutaneous Q8H   hydrocortisone cream   Topical TID   insulin aspart  0-5 Units Subcutaneous QHS   insulin aspart  0-6 Units Subcutaneous TID WC   insulin glargine-yfgn  4 Units Subcutaneous BID   methocarbamol  1,000  mg Oral TID   metoCLOPramide  10 mg Oral Q8H   metoprolol tartrate  25 mg Oral BID   midodrine  5 mg Oral TID WC   minocycline  200 mg Oral BID   multivitamin with minerals  1 tablet Oral Daily   nutrition supplement (JUVEN)  1 packet Oral BID BM   omeprazole  40 mg Oral Daily   silver nitrate applicators  1 Application Topical Once   sodium chloride flush  10-40 mL Intracatheter Q12H   sulfamethoxazole-trimethoprim  2 tablet Oral Q12H   Continuous Infusions:  sodium chloride 10 mL/hr at 11/10/22 2311   sodium chloride Stopped (11/06/22 0941)   meropenem (MERREM) IV 1 g (11/11/22 0627)   methocarbamol (ROBAXIN) IV      Nutritional status Signs/Symptoms: estimated needs Interventions: Juven, Glucerna shake Body mass index is 34.53 kg/m.  Data Reviewed:   CBC: Recent Labs  Lab 11/07/22 1900 11/08/22 0713 11/09/22 0030 11/10/22 0350 11/11/22 0340  WBC 44.6* 37.4*  36.4* 35.1* 33.5*  HGB 6.3* 8.5* 8.0* 8.8* 8.7*  HCT 19.1* 25.7* 24.6* 27.6* 27.5*  MCV 91.8 91.1 93.2 95.2 93.2  PLT 408* 223 446* 565* 622*   Basic Metabolic Panel: Recent Labs  Lab 11/07/22 0425 11/07/22 1900 11/08/22 0713 11/09/22 0030 11/10/22 0350 11/11/22 0340  NA 132*  --  129* 130* 131* 126*  K 4.0  --  5.4* 4.9 5.1 5.1  CL 114*  --  104 104 105 101  CO2 18*  --  16* 19* 19* 20*  GLUCOSE 91  --  228* 198* 229* 162*  BUN 37*  --  45* 40* 35* 36*  CREATININE 1.28*  --  1.57* 1.38* 1.15* 1.07*  CALCIUM 6.5*  --  7.2* 7.2* 7.4* 7.3*  MG  --  1.5*  --   --  1.8  --    GFR: Estimated Creatinine Clearance: 92.6 mL/min (A) (by C-G formula based on SCr of 1.07 mg/dL (H)). Liver Function Tests: No results for input(s): "AST", "ALT", "ALKPHOS", "BILITOT", "PROT", "ALBUMIN" in the last 168 hours. No results for input(s): "LIPASE", "AMYLASE" in the last 168 hours. No results for input(s): "AMMONIA" in the last 168 hours. Coagulation Profile: No results for input(s): "INR", "PROTIME" in the last 168  hours. Cardiac Enzymes: No results for input(s): "CKTOTAL", "CKMB", "CKMBINDEX", "TROPONINI" in the last 168 hours. BNP (last 3 results) No results for input(s): "PROBNP" in the last 8760 hours. HbA1C: No results for input(s): "HGBA1C" in the last 72 hours. CBG: Recent Labs  Lab 11/10/22 1248 11/10/22 1618 11/10/22 2022 11/11/22 0604 11/11/22 0753  GLUCAP 211* 139* 155* 133* 135*   Lipid Profile: No results for input(s): "CHOL", "HDL", "LDLCALC", "TRIG", "CHOLHDL", "LDLDIRECT" in the last 72 hours. Thyroid Function Tests: No results for input(s): "TSH", "T4TOTAL", "FREET4", "T3FREE", "THYROIDAB" in the last 72 hours. Anemia Panel: No results for input(s): "VITAMINB12", "FOLATE", "FERRITIN", "TIBC", "IRON", "RETICCTPCT" in the last 72 hours. Sepsis Labs: No results for input(s): "PROCALCITON", "LATICACIDVEN" in the last 168 hours.  Recent Results (from the past 240 hour(s))  MRSA Next Gen by PCR, Nasal     Status: None   Collection Time: 11/05/22  6:22 AM   Specimen: Nasal Mucosa; Nasal Swab  Result Value Ref Range Status   MRSA by PCR Next Gen NOT DETECTED NOT DETECTED Final    Comment: (NOTE) The GeneXpert MRSA Assay (FDA approved for NASAL specimens only), is one component of a comprehensive MRSA colonization surveillance program. It is not intended to diagnose MRSA infection nor to guide or monitor treatment for MRSA infections. Test performance is not FDA approved in patients less than 108 years old. Performed at [facility], [address] [place], [place] [address]   Aerobic/Anaerobic Culture w Gram Stain (surgical/deep wound)     Status: None   Collection Time: 11/05/22 11:46 AM   Specimen: PATH Cytology Misc. fluid; Body Fluid  Result Value Ref Range Status   Specimen Description FLUID LEFT KNEE EFFUSION  Final   Special Requests A  Final   Gram Stain   Final    ABUNDANT WBC PRESENT, PREDOMINANTLY PMN NO ORGANISMS SEEN    Culture   Final    No growth  aerobically or anaerobically. Performed at [facility], [address], [place], [place] [address]    Report Status 11/10/2022 FINAL  Final  Aerobic/Anaerobic Culture w Gram Stain (surgical/deep wound)     Status: None   Collection Time: 11/05/22 11:49  AM   Specimen: Thigh; Wound  Result Value Ref Range Status   Specimen Description THIGH LEFT LATERAL  Final   Special Requests B  Final   Gram Stain   Final    ABUNDANT WBC PRESENT, PREDOMINANTLY PMN NO ORGANISMS SEEN    Culture   Final    RARE CANDIDA PARAPSILOSIS NO ANAEROBES ISOLATED Performed at [facility], [address], [place], [place] [address]    Report Status 11/10/2022 FINAL  Final  Aerobic/Anaerobic Culture w Gram Stain (surgical/deep wound)     Status: None   Collection Time: 11/05/22 11:51 AM   Specimen: Thigh; Wound  Result Value Ref Range Status   Specimen Description TISSUE THIGH LEFT MEDIAL  Final   Special Requests TISSUE C  Final   Gram Stain   Final    ABUNDANT WBC PRESENT, PREDOMINANTLY PMN NO ORGANISMS SEEN    Culture   Final    RARE STENOTROPHOMONAS MALTOPHILIA NO ANAEROBES ISOLATED Performed at [facility], [address] [place], [place] [address]    Report Status 11/10/2022 FINAL  Final   Organism ID, Bacteria STENOTROPHOMONAS MALTOPHILIA  Final      Susceptibility   Stenotrophomonas maltophilia - MIC*    LEVOFLOXACIN 0.5 SENSITIVE Sensitive     TRIMETH/SULFA <=20 SENSITIVE Sensitive     * RARE STENOTROPHOMONAS MALTOPHILIA  Aerobic/Anaerobic Culture w Gram Stain (surgical/deep wound)     Status: None   Collection Time: 11/05/22 11:55 AM   Specimen: Thigh; Wound  Result Value Ref Range Status   Specimen Description THIGH RIGHT MEDIAL  Final   Special Requests DIALYSIS  Final   Gram Stain   Final    ABUNDANT WBC PRESENT, PREDOMINANTLY PMN NO ORGANISMS SEEN    Culture   Final    FEW KLEBSIELLA PNEUMONIAE Confirmed Extended Spectrum Beta-Lactamase Producer (ESBL).  In  bloodstream infections from ESBL organisms, carbapenems are preferred over piperacillin/tazobactam. They are shown to have a lower risk of mortality. FEW STENOTROPHOMONAS MALTOPHILIA NO ANAEROBES ISOLATED Performed at [facility], [address], [place], [place] [address]    Report Status 11/10/2022 FINAL  Final   Organism ID, Bacteria KLEBSIELLA PNEUMONIAE  Final   Organism ID, Bacteria STENOTROPHOMONAS MALTOPHILIA  Final      Susceptibility   Klebsiella pneumoniae - MIC*    AMPICILLIN >=32 RESISTANT Resistant     CEFEPIME >=32 RESISTANT Resistant     CEFTAZIDIME RESISTANT Resistant     CEFTRIAXONE >=64 RESISTANT Resistant     CIPROFLOXACIN >=4 RESISTANT Resistant     GENTAMICIN >=16 RESISTANT Resistant     IMIPENEM <=0.25 SENSITIVE Sensitive     TRIMETH/SULFA >=320 RESISTANT Resistant     AMPICILLIN/SULBACTAM >=32 RESISTANT Resistant     PIP/TAZO 64 INTERMEDIATE Intermediate     * FEW KLEBSIELLA PNEUMONIAE   Stenotrophomonas maltophilia - MIC*    LEVOFLOXACIN 0.5 SENSITIVE Sensitive     TRIMETH/SULFA <=20 SENSITIVE Sensitive     * FEW STENOTROPHOMONAS MALTOPHILIA  Aerobic/Anaerobic Culture w Gram Stain (surgical/deep wound)     Status: None   Collection Time: 11/05/22 11:56 AM   Specimen: Thigh; Wound  Result Value Ref Range Status   Specimen Description THIGH RIGHT LATERAL  Final   Special Requests THIGH E  Final   Gram Stain   Final    ABUNDANT WBC PRESENT, PREDOMINANTLY PMN NO ORGANISMS SEEN    Culture   Final    No growth aerobically or anaerobically. Performed at [facility], [address], [place], [place] [address]    Report Status 11/10/2022 FINAL  Final  Culture, blood (Routine X 2) w Reflex to ID Panel     Status: None   Collection Time: 11/06/22  7:14 PM   Specimen: BLOOD  Result Value Ref Range Status   Specimen Description BLOOD SITE NOT SPECIFIED  Final   Special Requests   Final    BOTTLES DRAWN AEROBIC AND ANAEROBIC Blood Culture  adequate volume   Culture   Final    NO GROWTH 5 DAYS Performed at [facility], [address] [place], [place] [address]    Report Status 11/11/2022 FINAL  Final  Culture, blood (Routine X 2) w Reflex to ID Panel     Status: None   Collection Time: 11/06/22  7:14 PM   Specimen: BLOOD  Result Value Ref Range Status   Specimen Description BLOOD SITE NOT SPECIFIED  Final   Special Requests   Final    BOTTLES DRAWN AEROBIC AND ANAEROBIC Blood Culture adequate volume   Culture   Final    NO GROWTH 5 DAYS Performed at [facility], [address] [place], [place] [address]    Report Status 11/11/2022 FINAL  Final  Aerobic/Anaerobic Culture w Gram Stain (surgical/deep wound)     Status: None (Preliminary result)   Collection Time: 11/07/22 12:00 PM   Specimen: Wound; Tissue  Result Value Ref Range Status   Specimen Description WOUND  Final   Special Requests medial left leg  Final   Gram Stain   Final    FEW WBC PRESENT, PREDOMINANTLY PMN NO ORGANISMS SEEN Performed at [facility], [address], [place], [place] [address]    Culture   Final    RARE STENOTROPHOMONAS MALTOPHILIA SUSCEPTIBILITIES PERFORMED ON PREVIOUS CULTURE WITHIN THE LAST 5 DAYS. NO ANAEROBES ISOLATED; CULTURE IN PROGRESS FOR 5 DAYS    Report Status PENDING  Incomplete  Aerobic/Anaerobic Culture w Gram Stain (surgical/deep wound)     Status: None (Preliminary result)   Collection Time: 11/07/22 12:18 PM   Specimen: Wound; Tissue  Result Value Ref Range Status   Specimen Description TISSUE  Final   Special Requests right BKA TISSUE  Final   Gram Stain   Final    FEW WBC PRESENT, PREDOMINANTLY PMN NO ORGANISMS SEEN Performed at [facility], [address], [place], [place] [address]    Culture   Final    RARE KLEBSIELLA PNEUMONIAE RARE STENOTROPHOMONAS MALTOPHILIA SUSCEPTIBILITIES PERFORMED ON PREVIOUS CULTURE WITHIN THE LAST 5 DAYS. NO ANAEROBES ISOLATED; CULTURE IN PROGRESS  FOR 5 DAYS    Report Status PENDING  Incomplete  Aerobic/Anaerobic Culture w Gram Stain (surgical/deep wound)     Status: None (Preliminary result)   Collection Time: 11/07/22 12:27 PM   Specimen: Wound; Tissue  Result Value Ref Range Status   Specimen Description TISSUE  Final   Special Requests lateral left thigh  Final   Gram Stain   Final    RARE WBC PRESENT, PREDOMINANTLY PMN NO ORGANISMS SEEN Performed at [facility], [address], [place], [place] [address]    Culture   Final    RARE KLEBSIELLA PNEUMONIAE RARE STENOTROPHOMONAS MALTOPHILIA NO ANAEROBES ISOLATED; CULTURE IN PROGRESS FOR 5 DAYS    Report Status PENDING  Incomplete   Organism ID, Bacteria STENOTROPHOMONAS MALTOPHILIA  Final   Organism ID, Bacteria KLEBSIELLA PNEUMONIAE  Final  Susceptibility   Klebsiella pneumoniae - MIC*    AMPICILLIN >=32 RESISTANT Resistant     CEFEPIME <=0.12 SENSITIVE Sensitive     CEFTAZIDIME <=1 SENSITIVE Sensitive     CEFTRIAXONE <=0.25 SENSITIVE Sensitive     CIPROFLOXACIN >=4 RESISTANT Resistant     GENTAMICIN >=16 RESISTANT Resistant     IMIPENEM <=0.25 SENSITIVE Sensitive     TRIMETH/SULFA <=20 SENSITIVE Sensitive     AMPICILLIN/SULBACTAM >=32 RESISTANT Resistant     PIP/TAZO 32 INTERMEDIATE Intermediate     * RARE KLEBSIELLA PNEUMONIAE   Stenotrophomonas maltophilia - MIC*    LEVOFLOXACIN 0.5 SENSITIVE Sensitive     TRIMETH/SULFA <=20 SENSITIVE Sensitive     * RARE STENOTROPHOMONAS MALTOPHILIA  Aerobic/Anaerobic Culture w Gram Stain (surgical/deep wound)     Status: None (Preliminary result)   Collection Time: 11/07/22 12:27 PM   Specimen: Wound; Tissue  Result Value Ref Range Status   Specimen Description TISSUE  Final   Special Requests left BKA stamp  Final   Gram Stain   Final    FEW WBC PRESENT,BOTH PMN AND MONONUCLEAR NO ORGANISMS SEEN Performed at [facility], [address] [place], [place] [address]    Culture   Final    RARE KLEBSIELLA  PNEUMONIAE SUSCEPTIBILITIES PERFORMED ON PREVIOUS CULTURE WITHIN THE LAST 5 DAYS. NO ANAEROBES ISOLATED; CULTURE IN PROGRESS FOR 5 DAYS    Report Status PENDING  Incomplete         Radiology Studies: No results found.         LOS: 39 days   Time spent= 35 mins

## 2022-11-11 NOTE — Progress Notes (Signed)
Physical Therapy Treatment Patient Details Name: Keymya Quinlan MRN: [id number] DOB: 1991-01-13 Today's Date: 11/11/2022   History of Present Illness Patient is 32 y.o. female presented to hospital 10/03/22 for bil foot wounds, fever, and pain for ~2 weeks with no improvement with antibiotics. Pt admitted for sepsis with necrotizing fasciitis/gangrene bilateral feet elected for surgical management. Pt underwent excision and debridement of BLE extending feet to thighs on 10/08/22 and remained intubated in ICU. 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 PMH significant for DM, HTN, gastric pacemaker.    PT Comments  Pt received in supine and agreeable to session. Pt reporting improved pain today and demonstrates improved mobility tolerance. Pt able to sit to EOB with CGA and perform seated BLE exercise. Pt instructed in lateral transfer to and from recliner and pt able to perform with mod A +2 with bedpads. Pt would benefit from trial with sliding board next session to improve ability to clear the gap between the bed and chair. Pt continues to benefit from PT services to progress toward functional mobility goals.    If plan is discharge home, recommend the following: Two people to help with walking and/or transfers;Two people to help with bathing/dressing/bathroom;Assistance with cooking/housework;Assist for transportation;Help with stairs or ramp for entrance;Supervision due to cognitive status   Can travel by private vehicle     No  Equipment Recommendations  Wheelchair (measurements PT);Wheelchair cushion (measurements PT);Other (comment)    Recommendations for Other Services       Precautions / Restrictions Precautions Precautions: Fall Precaution Comments: bil wound vac Restrictions Weight Bearing Restrictions: Yes Other Position/Activity Restrictions: bil BKA     Mobility  Bed Mobility Overal bed mobility: Needs Assistance Bed Mobility: Supine to Sit      Supine to sit: Contact guard, HOB elevated, Used rails     General bed mobility comments: increased time, but no physical assist needed    Transfers Overall transfer level: Needs assistance Equipment used:  (bedpads) Transfers: Bed to chair/wheelchair/BSC            Lateral/Scoot Transfers: Mod assist, +2 physical assistance General transfer comment: Lateral transfer from bed <> recliner with mod A +2 with bedpads and cues for hand placement and technique. Brief Max A +2 to get to higher surface when returning to bed due to pt demonstrating difficulty with hand placement        Balance Overall balance assessment: Needs assistance Sitting-balance support: Feet unsupported, Bilateral upper extremity supported Sitting balance-Leahy Scale: Fair Sitting balance - Comments: sitting EOB                                    Cognition Arousal: Alert Behavior During Therapy: WFL for tasks assessed/performed Overall Cognitive Status: Within Functional Limits for tasks assessed                                          Exercises General Exercises - Lower Extremity Hip Flexion/Marching: AROM, Seated, Both, 10 reps    General Comments        Pertinent Vitals/Pain Pain Assessment Pain Assessment: Faces Faces Pain Scale: Hurts little more Pain Location: BLE Pain Descriptors / Indicators: Grimacing, Tender, Aching, Guarding Pain Intervention(s): Monitored during session, Premedicated before session     PT Goals (current goals can now be  found in the care plan section) Acute Rehab PT Goals Patient Stated Goal: regain independence and ability to walk with prosthetics PT Goal Formulation: With patient Time For Goal Achievement: 11/08/22 Progress towards PT goals: Progressing toward goals    Frequency    Min 1X/week       AM-PAC PT "6 Clicks" Mobility   Outcome Measure  Help needed turning from your back to your side while in a flat bed  without using bedrails?: A Little Help needed moving from lying on your back to sitting on the side of a flat bed without using bedrails?: A Little Help needed moving to and from a bed to a chair (including a wheelchair)?: Total Help needed standing up from a chair using your arms (e.g., wheelchair or bedside chair)?: Total Help needed to walk in hospital room?: Total Help needed climbing 3-5 steps with a railing? : Total 6 Click Score: 10    End of Session Equipment Utilized During Treatment:  (bedpads) Activity Tolerance: Patient tolerated treatment well Patient left: in bed;with call bell/phone within reach;with family/visitor present (With OT present) Nurse Communication: Mobility status PT Visit Diagnosis: Muscle weakness (generalized) (M62.81);Difficulty in walking, not elsewhere classified (R26.2);Other symptoms and signs involving the nervous system (R29.898);Other abnormalities of gait and mobility (R26.89);Unsteadiness on feet (R26.81);Pain     Time: 1330-1403 PT Time Calculation (min) (ACUTE ONLY): 33 min  Charges:    $Therapeutic Exercise: 8-22 mins $Therapeutic Activity: 8-22 mins PT General Charges $$ ACUTE PT VISIT: 1 Visit                     Johny Shock, PTA Acute Rehabilitation Services Secure Chat Preferred  [phone]    Johny Shock 11/11/2022, 2:19 PM

## 2022-11-11 NOTE — TOC Progression Note (Signed)
Transition of Care [facility]) - Progression Note    Patient Details  Name: Kelli Hudson MRN: [id number] Date of Birth: 02-Aug-1990  Transition of Care [facility]) CM/SW Contact  Inis Sizer, LCSW Phone Number: 11/11/2022, 8:03 AM  Clinical Narrative:    Per chart and handoff review, insurance authorization is good through 11/16/22 for [facility] in [place]. Once patient is medically stable for discharge, she can be transferred to the facility depending on bed availability.  TOC will continue to follow for discharge planning purposes.   Expected Discharge Plan: Skilled Nursing Facility Barriers to Discharge: Continued Medical Work up, SNF Pending bed offer  Expected Discharge Plan and Services In-house Referral: Clinical Social Work   Post Acute Care Choice: Skilled Nursing Facility Living arrangements for the past 2 months: Apartment                                       Social Determinants of Health (SDOH) Interventions SDOH Screenings   Food Insecurity: No Food Insecurity (10/03/2022)  Housing: Low Risk  (10/03/2022)  Transportation Needs: No Transportation Needs (10/03/2022)  Utilities: Not At Risk (10/03/2022)  Alcohol Screen: Low Risk  (05/29/2021)  Depression (PHQ2-9): Low Risk  (05/29/2021)  Recent Concern: Depression (PHQ2-9) - Medium Risk (04/10/2021)  Financial Resource Strain: Low Risk  (02/13/2022)   Received from [facility], [facility]  Social Connections: Unknown (06/17/2021)   Received from [facility], [facility]  Stress: No Stress Concern Present (09/16/2022)   Received from [facility]  Tobacco Use: Low Risk  (11/07/2022)    Readmission Risk Interventions    05/08/2022    9:45 AM 03/24/2022    3:33 PM 12/12/2021    8:59 AM  Readmission Risk Prevention Plan  Transportation Screening Complete Complete Complete  Medication Review Oceanographer) Complete Complete Complete  PCP or Specialist appointment within 3-5 days of discharge  Complete Complete Complete  HRI or Home Care Consult Complete Complete Complete  SW Recovery Care/Counseling Consult Complete Complete Complete  Palliative Care Screening Not Applicable Not Applicable Not Applicable  Skilled Nursing Facility Not Applicable Not Applicable Not Applicable

## 2022-11-11 NOTE — Progress Notes (Addendum)
Occupational Therapy Treatment Patient Details Name: Kelli Hudson MRN: [id number] DOB: 16-Oct-1990 Today's Date: 11/11/2022   History of present illness Patient is 32 y.o. female presented to hospital 10/03/22 for bil foot wounds, fever, and pain for ~2 weeks with no improvement with antibiotics. Pt admitted for sepsis with necrotizing fasciitis/gangrene bilateral feet elected for surgical management. Pt underwent excision and debridement of BLE extending feet to thighs on 10/08/22 and remained intubated in ICU. 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 PMH significant for DM, HTN, gastric pacemaker.   OT comments  Patient sitting EOB  with PT upon arrival into room and agreeable to participate in OT intervention. Patient completed lateral scoot transfer from bed to bedside chair with mod A x2 with verbal cues for correct technique and hand placement. Patient completed tricep pushups while seated in chair for increased tricep strength to increase safety and ability to complete functional transfers in prep for use of sliding board.  Patient completed bedside chair to bed with mod Ax2 using bedpads to assist patient with lateral scoot.  Patient also educated on UE HEP using blue and green therabands with patient completed shoulder flexion, tricep extension and shoulder abduction with green and blue theraband. Patient is making progress toward  goals and would benefit from additional OT intervention to address functional deficits in order for patient to return to PLOF.      If plan is discharge home, recommend the following:  Two people to help with walking and/or transfers;A lot of help with bathing/dressing/bathroom;Assistance with cooking/housework;Assist for transportation;Help with stairs or ramp for entrance   Equipment Recommendations  Wheelchair (measurements OT);Wheelchair cushion (measurements OT)    Recommendations for Other Services Rehab consult    Precautions /  Restrictions Precautions Precautions: Fall Precaution Comments: bil wound vac Restrictions Weight Bearing Restrictions: Yes RLE Weight Bearing: Non weight bearing LLE Weight Bearing: Non weight bearing Other Position/Activity Restrictions: bil BKA       Mobility Bed Mobility Overal bed mobility: Needs Assistance   Rolling: Modified independent (Device/Increase time), Used rails     Sit to supine: Contact guard assist, Used rails, HOB elevated        Transfers Overall transfer level: Needs assistance   Transfers: Bed to chair/wheelchair/BSC            Lateral/Scoot Transfers: Mod assist, +2 physical assistance General transfer comment: Lateral transfer from bed <> recliner with mod A +2 with bedpads and cues for hand placement and technique. Brief Max A +2 to get to higher surface when returning to bed due to pt demonstrating difficulty with hand placement     Balance Overall balance assessment: Needs assistance Sitting-balance support: Feet unsupported, Bilateral upper extremity supported Sitting balance-Leahy Scale: Fair Sitting balance - Comments: sitting EOB                                   ADL either performed or assessed with clinical judgement   ADL Overall ADL's : Needs assistance/impaired Eating/Feeding: Independent;Sitting                                          Extremity/Trunk Assessment Upper Extremity Assessment Upper Extremity Assessment: Generalized weakness RUE Deficits / Details: MMT 4/5 overall LUE Deficits / Details: MMT 3+/5 overall  Vision       Perception     Praxis      Cognition Arousal: Alert Behavior During Therapy: WFL for tasks assessed/performed Overall Cognitive Status: Within Functional Limits for tasks assessed                                          Exercises Exercises: General Upper Extremity General Exercises - Upper Extremity Shoulder Flexion:  Strengthening, Both, 10 reps, Seated, Theraband Theraband Level (Shoulder Flexion): Level 4 (Blue), Level 3 (Green) Shoulder ABduction: Strengthening, 10 reps, Seated, Theraband Theraband Level (Shoulder Abduction): Level 4 (Blue), Level 3 (Green) Elbow Flexion: 10 reps, Strengthening, Seated, Theraband Theraband Level (Elbow Flexion): Level 3 (Green), Level 4 (Blue) Elbow Extension: 10 reps, Seated, Theraband, Strengthening (verbal cues for technique) Theraband Level (Elbow Extension): Level 3 (Green), Level 4 (Blue) Chair Push Up: 10 reps, Seated    Shoulder Instructions       General Comments      Pertinent Vitals/ Pain       Pain Assessment Pain Assessment: 0-10 Pain Score: 7  Faces Pain Scale:  (R LE) Pain Descriptors / Indicators: Aching Pain Intervention(s): Monitored during session  Home Living                                          Prior Functioning/Environment              Frequency  Min 1X/week        Progress Toward Goals  OT Goals(current goals can now be found in the care plan section)  Progress towards OT goals: Progressing toward goals  Acute Rehab OT Goals OT Goal Formulation: With patient Time For Goal Achievement: 11/08/22 Potential to Achieve Goals: Good ADL Goals Pt Will Perform Grooming: sitting;with modified independence Pt Will Perform Lower Body Bathing: bed level;with set-up Pt Will Perform Lower Body Dressing: bed level;with supervision;with set-up Pt Will Transfer to Toilet: anterior/posterior transfer;with contact guard assist;bedside commode Pt/caregiver will Perform Home Exercise Program: Increased strength;Both right and left upper extremity;With theraband;With written HEP provided  Plan      Co-evaluation    PT/OT/SLP Co-Evaluation/Treatment: Yes Reason for Co-Treatment: For patient/therapist safety PT goals addressed during session: Mobility/safety with mobility;Strengthening/ROM OT goals addressed  during session: ADL's and self-care      AM-PAC OT "6 Clicks" Daily Activity     Outcome Measure     Help from another person taking care of personal grooming?: A Little Help from another person toileting, which includes using toliet, bedpan, or urinal?: Total Help from another person bathing (including washing, rinsing, drying)?: A Lot Help from another person to put on and taking off regular upper body clothing?: A Little Help from another person to put on and taking off regular lower body clothing?: A Lot 6 Click Score: 11    End of Session    OT Visit Diagnosis: Muscle weakness (generalized) (M62.81);Pain Pain - Right/Left: Right Pain - part of body: Leg   Activity Tolerance Patient tolerated treatment well   Patient Left in bed;with call bell/phone within reach;Other (comment)   Nurse Communication          Time: 4098-1191 OT Time Calculation (min): 35 min  Charges: OT General Charges $OT Visit: 1 Visit OT Treatments $Self Care/Home Management :  8-22 mins $Therapeutic Exercise: 8-22 mins  Governor Specking OT/L  Denice Paradise 11/11/2022, 4:30 PM

## 2022-11-11 NOTE — Progress Notes (Signed)
[facility]    Date of Admission:  10/03/2022      ID: Kelli Hudson is a 32 y.o. female with necrotizing fasciitis s/p bilateral bka but still having thigh involvement Principal Problem:   Necrotizing fasciitis (HCC) Active Problems:   Anemia   Sepsis (HCC)   AKI (acute kidney injury) (HCC)   Diabetic gastroparesis associated with type 1 diabetes mellitus (HCC)   DM type 1 (diabetes mellitus, type 1) (HCC)   Drug-seeking behavior   Sepsis due to cellulitis (HCC)   Cellulitis of lower extremity   Necrotizing fasciitis of pelvic region and thigh (HCC)   Necrotizing fasciitis of lower leg (HCC)   MDD (major depressive disorder), recurrent episode, moderate (HCC)   Effusion, left knee    Subjective: Afebrile. No new symptoms since changing out abtx yesterday  Medications:   acetaminophen  1,000 mg Oral Q6H   vitamin C  1,000 mg Oral Daily   Chlorhexidine Gluconate Cloth  6 each Topical Q0600   docusate sodium  100 mg Oral BID   DULoxetine  30 mg Oral Daily   fluconazole  400 mg Oral Daily   gabapentin  300 mg Oral Q8H   heparin injection (subcutaneous)  5,000 Units Subcutaneous Q8H   hydrocortisone cream   Topical TID   insulin aspart  0-5 Units Subcutaneous QHS   insulin aspart  0-6 Units Subcutaneous TID WC   insulin glargine-yfgn  4 Units Subcutaneous BID   methocarbamol  1,000 mg Oral TID   metoCLOPramide  10 mg Oral Q8H   metoprolol tartrate  25 mg Oral BID   midodrine  5 mg Oral TID WC   minocycline  200 mg Oral BID   multivitamin with minerals  1 tablet Oral Daily   nutrition supplement (JUVEN)  1 packet Oral BID BM   omeprazole  40 mg Oral Daily   silver nitrate applicators  1 Application Topical Once   sodium chloride flush  10-40 mL Intracatheter Q12H   sulfamethoxazole-trimethoprim  2 tablet Oral Q12H    Objective: Vital signs in last 24 hours: Temp:  [97.8 F (36.6 C)-98.7 F (37.1 C)] 97.8 F (36.6 C) (09/24  0716) Pulse Rate:  [90-95] 95 (09/24 0716) Resp:  [15] 15 (09/23 2325) BP: (129-146)/(77-97) 132/97 (09/24 0716) SpO2:  [91 %-100 %] 95 % (09/24 0716) Weight:  [100 kg] 100 kg (09/24 0331)  Physical Exam  Constitutional:  oriented to person, place, and time. appears well-developed and well-nourished. No distress.  HENT: [place]/AT, PERRLA, no scleral icterus Mouth/Throat: Oropharynx is clear and moist. No oropharyngeal exudate.  Cardiovascular: Normal rate, regular rhythm and normal heart sounds. Exam reveals no gallop and no friction rub.  No murmur heard.  Pulmonary/Chest: Effort normal and breath sounds normal. No respiratory distress.  has no wheezes.  Neck = supple, no nuchal rigidity Abdominal: Soft. Bowel sounds are normal.  exhibits no distension. There is no tenderness.  Lymphadenopathy: no cervical adenopathy. No axillary adenopathy Ext: bilateral BKA, wound vac bilaterally in place Neurological: alert and oriented to person, place, and time.  Skin: Skin is warm and dry. No rash noted. No erythema.  Psychiatric: a normal mood and affect.  behavior is normal.    Lab Results Recent Labs    11/10/22 0350 11/11/22 0340  WBC 35.1* 33.5*  HGB 8.8* 8.7*  HCT 27.6* 27.5*  NA 131* 126*  K 5.1 5.1  CL 105 101  CO2 19* 20*  BUN 35* 36*  CREATININE 1.15* 1.07*   Lab Results  Component Value Date   ESRSEDRATE 3 (H) 10/31/2022    Microbiology: reviewed Studies/Results: No results found.   Assessment/Plan: Necrotizing fasciitis, polymicrobial including esbl kelb, rare yeast, and stenotrophomonas. Continue on meropenem, fluconazole and dual therapy for steno to include bactrim plus minocycline.  Scheduled to go back to OR on 9/25  Leukocytosis = continue to monitor. Anticipate to trend down with source control and proper antibiotics  Anemia = stable  Depression = stable and continues on cymbalta  [facility] Pager:  [phone]  11/11/2022, 11:36 AM

## 2022-11-11 NOTE — Anesthesia Postprocedure Evaluation (Signed)
Anesthesia Post Note  Patient: Network engineer  Procedure(s) Performed: BILATERAL THIGH AND LEG DEBRIDEMENT (Bilateral: Leg Upper)     Patient location during evaluation: PACU Anesthesia Type: General Level of consciousness: awake and alert Pain management: pain level controlled Vital Signs Assessment: post-procedure vital signs reviewed and stable Respiratory status: spontaneous breathing, nonlabored ventilation, respiratory function stable and patient connected to nasal cannula oxygen Cardiovascular status: blood pressure returned to baseline and stable Postop Assessment: no apparent nausea or vomiting Anesthetic complications: no   No notable events documented.  Last Vitals:  Vitals:   11/11/22 0331 11/11/22 0716  BP: 129/78 (!) 132/97  Pulse: 90 95  Resp:    Temp: 36.7 C 36.6 C  SpO2: 96% 95%    Last Pain:  Vitals:   11/11/22 0716  TempSrc: Oral  PainSc:                  Kelli Hudson

## 2022-11-12 ENCOUNTER — Inpatient Hospital Stay (HOSPITAL_COMMUNITY): Payer: Medicaid Other | Admitting: Anesthesiology

## 2022-11-12 ENCOUNTER — Encounter (HOSPITAL_COMMUNITY)
Admission: EM | Disposition: A | Discharge status: Skilled Nursing Facility | Payer: Self-pay | Source: Home / Self Care | Attending: Internal Medicine

## 2022-11-12 ENCOUNTER — Encounter (HOSPITAL_COMMUNITY): Payer: Self-pay | Admitting: Orthopedic Surgery

## 2022-11-12 ENCOUNTER — Other Ambulatory Visit: Payer: Self-pay

## 2022-11-12 DIAGNOSIS — M726 Necrotizing fasciitis: Secondary | ICD-10-CM

## 2022-11-12 DIAGNOSIS — F418 Other specified anxiety disorders: Secondary | ICD-10-CM | POA: Diagnosis not present

## 2022-11-12 DIAGNOSIS — I10 Essential (primary) hypertension: Secondary | ICD-10-CM | POA: Diagnosis not present

## 2022-11-12 DIAGNOSIS — E0590 Thyrotoxicosis, unspecified without thyrotoxic crisis or storm: Secondary | ICD-10-CM | POA: Diagnosis not present

## 2022-11-12 HISTORY — PX: I & D EXTREMITY: SHX5045

## 2022-11-12 LAB — AEROBIC/ANAEROBIC CULTURE W GRAM STAIN (SURGICAL/DEEP WOUND)

## 2022-11-12 LAB — BASIC METABOLIC PANEL
Anion gap: 4 — ABNORMAL LOW (ref 5–15)
BUN: 35 mg/dL — ABNORMAL HIGH (ref 6–20)
CO2: 20 mmol/L — ABNORMAL LOW (ref 22–32)
Calcium: 7.7 mg/dL — ABNORMAL LOW (ref 8.9–10.3)
Chloride: 105 mmol/L (ref 98–111)
Creatinine, Ser: 1.19 mg/dL — ABNORMAL HIGH (ref 0.44–1.00)
GFR, Estimated: >60 mL/min (ref >60)
Glucose, Bld: 100 mg/dL — ABNORMAL HIGH (ref 70–99)
Potassium: 5.5 mmol/L — ABNORMAL HIGH (ref 3.5–5.1)
Sodium: 129 mmol/L — ABNORMAL LOW (ref 135–145)

## 2022-11-12 LAB — GLUCOSE, CAPILLARY
Glucose-Capillary: 81 mg/dL (ref 70–99)
Glucose-Capillary: 65 mg/dL — ABNORMAL LOW (ref 70–99)
Glucose-Capillary: 97 mg/dL (ref 70–99)
Glucose-Capillary: 78 mg/dL (ref 70–99)
Glucose-Capillary: 69 mg/dL — ABNORMAL LOW (ref 70–99)
Glucose-Capillary: 81 mg/dL (ref 70–99)
Glucose-Capillary: 130 mg/dL — ABNORMAL HIGH (ref 70–99)

## 2022-11-12 SURGERY — IRRIGATION AND DEBRIDEMENT EXTREMITY
Anesthesia: General | Site: Leg Upper | Laterality: Bilateral

## 2022-11-12 MED ORDER — HYDROMORPHONE HCL 1 MG/ML IJ SOLN
0.5000 mg | Freq: Once | INTRAMUSCULAR | Status: AC
  Administered 2022-11-12: 0.5 mg via INTRAVENOUS

## 2022-11-12 MED ORDER — LIDOCAINE 2% (20 MG/ML) 5 ML SYRINGE
INTRAMUSCULAR | Status: AC
  Filled 2022-11-12: qty 5

## 2022-11-12 MED ORDER — PROPOFOL 10 MG/ML IV BOLUS
INTRAVENOUS | Status: DC | PRN
  Administered 2022-11-12: 200 mg via INTRAVENOUS

## 2022-11-12 MED ORDER — ALUM & MAG HYDROXIDE-SIMETH 200-200-20 MG/5ML PO SUSP: 15.0000 mL | ORAL | Status: DC | PRN

## 2022-11-12 MED ORDER — LIDOCAINE 2% (20 MG/ML) 5 ML SYRINGE
INTRAMUSCULAR | Status: DC | PRN
  Administered 2022-11-12: 40 mg via INTRAVENOUS

## 2022-11-12 MED ORDER — AMISULPRIDE (ANTIEMETIC) 5 MG/2ML IV SOLN: 10.0000 mg | Freq: Once | INTRAVENOUS | Status: DC | PRN

## 2022-11-12 MED ORDER — HYDROMORPHONE HCL 1 MG/ML IJ SOLN
INTRAMUSCULAR | Status: AC
  Filled 2022-11-12: qty 0.5

## 2022-11-12 MED ORDER — PANTOPRAZOLE SODIUM 40 MG PO TBEC: 40.0000 mg | DELAYED_RELEASE_TABLET | Freq: Every day | ORAL | Status: DC

## 2022-11-12 MED ORDER — SUCCINYLCHOLINE CHLORIDE 200 MG/10ML IV SOSY
PREFILLED_SYRINGE | INTRAVENOUS | Status: AC
  Filled 2022-11-12: qty 10

## 2022-11-12 MED ORDER — HYDRALAZINE HCL 20 MG/ML IJ SOLN: 5.0000 mg | INTRAMUSCULAR | Status: DC | PRN

## 2022-11-12 MED ORDER — CHLORHEXIDINE GLUCONATE 4 % EX SOLN
60.0000 mL | Freq: Once | CUTANEOUS | Status: AC
  Administered 2022-11-12: 4 via TOPICAL
  Filled 2022-11-12: qty 60

## 2022-11-12 MED ORDER — ROCURONIUM BROMIDE 10 MG/ML (PF) SYRINGE
PREFILLED_SYRINGE | INTRAVENOUS | Status: DC | PRN
  Administered 2022-11-12: 20 mg via INTRAVENOUS

## 2022-11-12 MED ORDER — HYDROMORPHONE HCL 1 MG/ML IJ SOLN
INTRAMUSCULAR | Status: AC
  Filled 2022-11-12: qty 1

## 2022-11-12 MED ORDER — ROCURONIUM BROMIDE 10 MG/ML (PF) SYRINGE
PREFILLED_SYRINGE | INTRAVENOUS | Status: AC
  Filled 2022-11-12: qty 10

## 2022-11-12 MED ORDER — PHENYLEPHRINE 80 MCG/ML (10ML) SYRINGE FOR IV PUSH (FOR BLOOD PRESSURE SUPPORT)
PREFILLED_SYRINGE | INTRAVENOUS | Status: DC | PRN
  Administered 2022-11-12: 80 ug via INTRAVENOUS

## 2022-11-12 MED ORDER — PROPOFOL 10 MG/ML IV BOLUS
INTRAVENOUS | Status: AC
  Filled 2022-11-12: qty 20

## 2022-11-12 MED ORDER — 0.9 % SODIUM CHLORIDE (POUR BTL) OPTIME
TOPICAL | Status: DC | PRN
  Administered 2022-11-12: 1000 mL

## 2022-11-12 MED ORDER — CHLORHEXIDINE GLUCONATE 0.12 % MT SOLN
OROMUCOSAL | Status: AC
  Filled 2022-11-12: qty 15

## 2022-11-12 MED ORDER — HYDROMORPHONE HCL 1 MG/ML IJ SOLN
INTRAMUSCULAR | Status: DC | PRN
Start: 2022-11-12 — End: 2022-11-12
  Administered 2022-11-12: .5 mg via INTRAVENOUS

## 2022-11-12 MED ORDER — DEXTROSE 50 % IV SOLN: 50.0000 mL | Freq: Once | INTRAVENOUS | Status: DC

## 2022-11-12 MED ORDER — CEFAZOLIN SODIUM-DEXTROSE 2-4 GM/100ML-% IV SOLN: 2.0000 g | INTRAVENOUS | Status: DC

## 2022-11-12 MED ORDER — POVIDONE-IODINE 10 % EX SWAB: 2.0000 | Freq: Once | CUTANEOUS | Status: DC

## 2022-11-12 MED ORDER — HYDROMORPHONE HCL 1 MG/ML IJ SOLN
0.2500 mg | INTRAMUSCULAR | Status: DC | PRN
  Administered 2022-11-12: 0.5 mg via INTRAVENOUS

## 2022-11-12 MED ORDER — DEXAMETHASONE SODIUM PHOSPHATE 10 MG/ML IJ SOLN
INTRAMUSCULAR | Status: DC | PRN
  Administered 2022-11-12: 10 mg via INTRAVENOUS

## 2022-11-12 MED ORDER — MIDAZOLAM HCL 2 MG/2ML IJ SOLN
INTRAMUSCULAR | Status: AC
  Filled 2022-11-12: qty 2

## 2022-11-12 MED ORDER — MAGNESIUM CITRATE PO SOLN: 1.0000 | Freq: Once | ORAL | Status: DC | PRN

## 2022-11-12 MED ORDER — VASHE WOUND IRRIGATION OPTIME
TOPICAL | Status: DC | PRN
  Administered 2022-11-12 (×4): 34 [oz_av]

## 2022-11-12 MED ORDER — ORAL CARE MOUTH RINSE: 15.0000 mL | Freq: Once | OROMUCOSAL | Status: DC

## 2022-11-12 MED ORDER — METOPROLOL TARTRATE 5 MG/5ML IV SOLN: 2.0000 mg | INTRAVENOUS | Status: DC | PRN

## 2022-11-12 MED ORDER — MIDAZOLAM HCL 2 MG/2ML IJ SOLN
INTRAMUSCULAR | Status: DC | PRN
  Administered 2022-11-12: 2 mg via INTRAVENOUS

## 2022-11-12 MED ORDER — PHENOL 1.4 % MT LIQD: 1.0000 | OROMUCOSAL | Status: DC | PRN

## 2022-11-12 MED ORDER — ONDANSETRON HCL 4 MG/2ML IJ SOLN
INTRAMUSCULAR | Status: AC
  Filled 2022-11-12: qty 4

## 2022-11-12 MED ORDER — POLYETHYLENE GLYCOL 3350 17 G PO PACK: 17.0000 g | PACK | Freq: Every day | ORAL | Status: DC | PRN

## 2022-11-12 MED ORDER — SODIUM CHLORIDE 0.9 % IV SOLN: INTRAVENOUS | Status: DC

## 2022-11-12 MED ORDER — CEFAZOLIN SODIUM-DEXTROSE 2-4 GM/100ML-% IV SOLN
2.0000 g | Freq: Three times a day (TID) | INTRAVENOUS | Status: AC
  Administered 2022-11-12 – 2022-11-13 (×2): 2 g via INTRAVENOUS
  Filled 2022-11-12 (×2): qty 100

## 2022-11-12 MED ORDER — MAGNESIUM SULFATE 2 GM/50ML IV SOLN: 2.0000 g | Freq: Every day | INTRAVENOUS | Status: DC | PRN

## 2022-11-12 MED ORDER — VITAMIN C 500 MG PO TABS: 1000.0000 mg | ORAL_TABLET | Freq: Every day | ORAL | Status: DC

## 2022-11-12 MED ORDER — SUGAMMADEX SODIUM 200 MG/2ML IV SOLN
INTRAVENOUS | Status: DC | PRN
  Administered 2022-11-12: 200 mg via INTRAVENOUS

## 2022-11-12 MED ORDER — LACTATED RINGERS IV SOLN: INTRAVENOUS | Status: DC

## 2022-11-12 MED ORDER — DEXTROSE 50 % IV SOLN
INTRAVENOUS | Status: AC
  Administered 2022-11-12: 50 mL
  Filled 2022-11-12: qty 50

## 2022-11-12 MED ORDER — DOCUSATE SODIUM 100 MG PO CAPS
100.0000 mg | ORAL_CAPSULE | Freq: Every day | ORAL | Status: DC
  Filled 2022-11-12: qty 1

## 2022-11-12 MED ORDER — JUVEN PO PACK
1.0000 | PACK | Freq: Two times a day (BID) | ORAL | Status: DC
  Filled 2022-11-12 (×2): qty 1

## 2022-11-12 MED ORDER — LABETALOL HCL 5 MG/ML IV SOLN: 10.0000 mg | INTRAVENOUS | Status: DC | PRN

## 2022-11-12 MED ORDER — POTASSIUM CHLORIDE CRYS ER 20 MEQ PO TBCR: 20.0000 meq | EXTENDED_RELEASE_TABLET | Freq: Every day | ORAL | Status: DC | PRN

## 2022-11-12 MED ORDER — ZINC SULFATE 220 (50 ZN) MG PO CAPS
220.0000 mg | ORAL_CAPSULE | Freq: Every day | ORAL | Status: DC
  Administered 2022-11-12 – 2022-11-14 (×3): 220 mg via ORAL
  Filled 2022-11-12 (×3): qty 1

## 2022-11-12 MED ORDER — GUAIFENESIN-DM 100-10 MG/5ML PO SYRP: 15.0000 mL | ORAL_SOLUTION | ORAL | Status: DC | PRN

## 2022-11-12 MED ORDER — SUCCINYLCHOLINE CHLORIDE 200 MG/10ML IV SOSY
PREFILLED_SYRINGE | INTRAVENOUS | Status: DC | PRN
  Administered 2022-11-12: 100 mg via INTRAVENOUS

## 2022-11-12 MED ORDER — CHLORHEXIDINE GLUCONATE 0.12 % MT SOLN: 15.0000 mL | Freq: Once | OROMUCOSAL | Status: DC

## 2022-11-12 MED ORDER — BISACODYL 5 MG PO TBEC: 5.0000 mg | DELAYED_RELEASE_TABLET | Freq: Every day | ORAL | Status: DC | PRN

## 2022-11-12 MED ORDER — DEXAMETHASONE SODIUM PHOSPHATE 10 MG/ML IJ SOLN
INTRAMUSCULAR | Status: AC
  Filled 2022-11-12: qty 1

## 2022-11-12 SURGICAL SUPPLY — 39 items
BAG COUNTER SPONGE SURGICOUNT (BAG) IMPLANT
BAG SPNG CNTER NS LX DISP (BAG)
BLADE SURG 21 STRL SS (BLADE) ×1 IMPLANT
BNDG CMPR 5X6 CHSV STRCH STRL (GAUZE/BANDAGES/DRESSINGS)
BNDG COHESIVE 6X5 TAN NS LF (GAUZE/BANDAGES/DRESSINGS) IMPLANT
BNDG COHESIVE 6X5 TAN ST LF (GAUZE/BANDAGES/DRESSINGS) IMPLANT
BNDG GAUZE DERMACEA FLUFF 4 (GAUZE/BANDAGES/DRESSINGS) ×2 IMPLANT
BNDG GZE DERMACEA 4 6PLY (GAUZE/BANDAGES/DRESSINGS) ×4
CLEANSER WND VASHE INSTL 34OZ (WOUND CARE) IMPLANT
COVER SURGICAL LIGHT HANDLE (MISCELLANEOUS) ×2 IMPLANT
DRAPE EXTREMITY T 121X128X90 (DISPOSABLE) IMPLANT
DRAPE U-SHAPE 47X51 STRL (DRAPES) ×1 IMPLANT
DRSG ADAPTIC 3X8 NADH LF (GAUZE/BANDAGES/DRESSINGS) ×1 IMPLANT
DURAPREP 26ML APPLICATOR (WOUND CARE) ×1 IMPLANT
ELECT REM PT RETURN 9FT ADLT (ELECTROSURGICAL) ×1
ELECTRODE REM PT RTRN 9FT ADLT (ELECTROSURGICAL) IMPLANT
GAUZE PAD ABD 8X10 STRL (GAUZE/BANDAGES/DRESSINGS) IMPLANT
GAUZE SPONGE 4X4 12PLY STRL (GAUZE/BANDAGES/DRESSINGS) ×1 IMPLANT
GLOVE BIOGEL PI IND STRL 9 (GLOVE) ×1 IMPLANT
GLOVE SURG ORTHO 9.0 STRL STRW (GLOVE) ×1 IMPLANT
GOWN STRL REUS W/ TWL XL LVL3 (GOWN DISPOSABLE) ×2 IMPLANT
GOWN STRL REUS W/TWL XL LVL3 (GOWN DISPOSABLE) ×2
GRAFT SKIN WND SURGICLOSE M95 (Tissue) IMPLANT
HANDPIECE INTERPULSE COAX TIP (DISPOSABLE)
KIT BASIN OR (CUSTOM PROCEDURE TRAY) ×1 IMPLANT
KIT TURNOVER KIT B (KITS) ×1 IMPLANT
MANIFOLD NEPTUNE II (INSTRUMENTS) ×1 IMPLANT
NS IRRIG 1000ML POUR BTL (IV SOLUTION) ×1 IMPLANT
PACK ORTHO EXTREMITY (CUSTOM PROCEDURE TRAY) ×1 IMPLANT
PAD ARMBOARD 7.5X6 YLW CONV (MISCELLANEOUS) ×2 IMPLANT
SET HNDPC FAN SPRY TIP SCT (DISPOSABLE) IMPLANT
STAPLER VISISTAT 35W (STAPLE) IMPLANT
STOCKINETTE IMPERVIOUS 9X36 MD (GAUZE/BANDAGES/DRESSINGS) IMPLANT
SUT ETHILON 2 0 PSLX (SUTURE) ×1 IMPLANT
SWAB COLLECTION DEVICE MRSA (MISCELLANEOUS) ×1 IMPLANT
SWAB CULTURE ESWAB REG 1ML (MISCELLANEOUS) IMPLANT
TOWEL GREEN STERILE (TOWEL DISPOSABLE) ×1 IMPLANT
TUBE CONNECTING 12X1/4 (SUCTIONS) ×1 IMPLANT
YANKAUER SUCT BULB TIP NO VENT (SUCTIONS) ×1 IMPLANT

## 2022-11-12 NOTE — Interval H&P Note (Signed)
History and Physical Interval Note:  11/12/2022 6:52 AM  Kelli Hudson  has presented today for surgery, with the diagnosis of Necrotizing Fasciitis.  The various methods of treatment have been discussed with the patient and family. After consideration of risks, benefits and other options for treatment, the patient has consented to  Procedure(s): BILATERAL LEG DEBRIDEMENTS (Bilateral) as a surgical intervention.  The patient's history has been reviewed, patient examined, no change in status, stable for surgery.  I have reviewed the patient's chart and labs.  Questions were answered to the patient's satisfaction.     Kelli Hudson

## 2022-11-12 NOTE — Transfer of Care (Signed)
Immediate Anesthesia Transfer of Care Note  Patient: Kelli Hudson  Procedure(s) Performed: BILATERAL LEG DEBRIDEMENTS (Bilateral: Leg Upper)  Patient Location: PACU  Anesthesia Type:General  Level of Consciousness: awake  Airway & Oxygen Therapy: Patient Spontanous Breathing  Post-op Assessment: Report given to RN and Post -op Vital signs reviewed and stable  Post vital signs: Reviewed and stable  Last Vitals:  Vitals Value Taken Time  BP 144/92 11/12/22 1725  Temp    Pulse 112 11/12/22 1725  Resp 13 11/12/22 1728  SpO2 100 % 11/12/22 1725  Vitals shown include unfiled device data.  Last Pain:  Vitals:   11/12/22 1338  TempSrc:   PainSc: 9       Patients Stated Pain Goal: 5 (11/12/22 0940)  Complications: No notable events documented.

## 2022-11-12 NOTE — Progress Notes (Signed)
Pm Progress Note  Pt tolerated time up in the recliner well, better than she expected.  As expected, more assist to transfer back to bed up an incline.    11/12/22 1747  PT Visit Information  Last PT Received On 11/12/22  Assistance Needed +2  History of Present Illness Patient is 32 y.o. female presented to hospital 10/03/22 for bil foot wounds, fever, and pain for ~2 weeks with no improvement with antibiotics. Pt admitted for sepsis with necrotizing fasciitis/gangrene bilateral feet elected for surgical management. Pt underwent excision and debridement of BLE extending feet to thighs on 10/08/22 and remained intubated in ICU. 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 PMH significant for DM, HTN, gastric pacemaker.  Precautions  Precautions Fall  Precaution Comments bil wound vac  Restrictions  Other Position/Activity Restrictions bil BKA  Pain Assessment  Pain Assessment 0-10  Pain Score 8  Faces Pain Scale 4  Pain Location L LE much worse than R LE  Pain Descriptors / Indicators Aching;Sore;Sharp  Pain Intervention(s) Limited activity within patient's tolerance;Monitored during session  Cognition  Arousal Alert  Behavior During Therapy WFL for tasks assessed/performed  Overall Cognitive Status Within Functional Limits for tasks assessed  Bed Mobility  Bed Mobility Supine to Sit  Supine to sit Contact guard (lower HOB)  General bed mobility comments extra time, but no assist  Transfers  Overall transfer level Needs assistance  Transfers Bed to chair/wheelchair/BSC  Anterior-Posterior transfers Max assist;Mod assist (min/ mod scooting toward EOB, some use of padding.)   Lateral/Scoot Transfers With slide board;Max assist (min assist scooting to EOB,  Sliding board chair to bed)  General transfer comment cued for best technique, pt managed balance in sitting, therapist managed stability at R LE.  mod to max assist getting onto and sliding down the SB with  positioning in the chair.  Balance  Overall balance assessment Needs assistance  Sitting-balance support Bilateral upper extremity supported;No upper extremity supported;Feet unsupported  Sitting balance-Leahy Scale Fair  Sitting balance - Comments sitting EOB  General Comments  General comments (skin integrity, edema, etc.) vss overall  Amputee Exercises  Quad Sets AROM;10 reps;Supine  PT - End of Session  Equipment Utilized During Treatment  (sliding board)  Activity Tolerance Patient tolerated treatment well;Patient limited by pain  Patient left in chair;with call bell/phone within reach  Nurse Communication Mobility status   PT - Assessment/Plan  PT Visit Diagnosis Other abnormalities of gait and mobility (R26.89);Muscle weakness (generalized) (M62.81);Pain  Pain - Right/Left  (bil)  Pain - part of body Leg  PT Frequency (ACUTE ONLY) Min 1X/week  Recommendations for Other Services Rehab consult  Follow Up Recommendations Skilled nursing-short term rehab (<3 hours/day)  Can patient physically be transported by private vehicle No  Patient can return home with the following Two people to help with walking and/or transfers;Two people to help with bathing/dressing/bathroom;Assistance with cooking/housework;Assist for transportation;Help with stairs or ramp for entrance;Supervision due to cognitive status  PT equipment Wheelchair (measurements PT);Wheelchair cushion (measurements PT);Other (comment)  AM-PAC PT "6 Clicks" Mobility Outcome Measure (Version 2)  Help needed turning from your back to your side while in a flat bed without using bedrails? 3  Help needed moving from lying on your back to sitting on the side of a flat bed without using bedrails? 3  Help needed moving to and from a bed to a chair (including a wheelchair)? 1  Help needed standing up from a chair using your arms (e.g.,  wheelchair or bedside chair)? 1  Help needed to walk in hospital room? 1  Help needed climbing  3-5 steps with a railing?  1  6 Click Score 10  Consider Recommendation of Discharge To: CIR/SNF/LTACH  Progressive Mobility  What is the highest level of mobility based on the progressive mobility assessment? Level 1 (Bedfast) - Unable to balance while sitting on edge of bed  Activity Transferred from chair to bed;Stood at bedside  PT Goal Progression  Progress towards PT goals Progressing toward goals  Acute Rehab PT Goals  PT Goal Formulation With patient  Time For Goal Achievement 11/08/22  Potential to Achieve Goals Good  PT Time Calculation  PT Start Time (ACUTE ONLY) 1208  PT Stop Time (ACUTE ONLY) 1238  PT Time Calculation (min) (ACUTE ONLY) 30 min  PT General Charges  $$ ACUTE PT VISIT 1 Visit  PT Treatments  $Therapeutic Activity 23-37 mins  11/12/2022  Jacinto Halim., PT Acute Rehabilitation Services [phone]  (office)

## 2022-11-12 NOTE — Op Note (Signed)
11/12/2022  5:23 PM  PATIENT:  Kelli Hudson    PRE-OPERATIVE DIAGNOSIS:  Necrotizing Fasciitis bilateral lower extremities  POST-OPERATIVE DIAGNOSIS:  Same  PROCEDURE: Excisional DEBRIDEMENT of both legs and both thighs.  Excision skin soft tissue and fascia. Separate cultures sent for the left and right lower extremity of soft tissue. Application Kerecis micro graft 95 cm bilateral lower extremities.  SURGEON:  Nadara Mustard, MD  PHYSICIAN ASSISTANT:None ANESTHESIA:   General  PREOPERATIVE INDICATIONS:  Kelli Hudson is a  32 y.o. female with a diagnosis of Necrotizing Fasciitis who failed conservative measures and elected for surgical management.    The risks benefits and alternatives were discussed with the patient preoperatively including but not limited to the risks of infection, bleeding, nerve injury, cardiopulmonary complications, the need for revision surgery, among others, and the patient was willing to proceed.  OPERATIVE IMPLANTS:   Implant Name Type Inv. Item Serial No. Manufacturer Lot No. LRB No. Used Action  GRAFT SKIN WND SURGICLOSE M95 - [id number] Tissue GRAFT SKIN WND SURGICLOSE M95  [employer] [id number] Left 1 Implanted  GRAFT SKIN WND SURGICLOSE M95 - [id number] Tissue GRAFT SKIN WND SURGICLOSE M95  [employer] [id number] Right 1 Implanted    @ENCIMAGES @  OPERATIVE FINDINGS: The muscle and the residual limb bilateral lower extremities had poor color had no contractility and poor consistency. Tissue sent for cultures for each lower extremity.  OPERATIVE PROCEDURE: Patient was brought the operating room and underwent a general anesthetic.  After adequate levels anesthesia obtained both lower extremities were prepped using DuraPrep draped into a sterile field a timeout was called.  Attention was first focused on the left lower extremity the medial and lateral left thigh wounds were open.  There was nonviable tissue.  A rondure and 21 blade  knife was used to excise skin soft tissue muscle and fascia that was not viable.  This was sent for cultures.  The wound was irrigated with Vashe irrigation.  Wounds were opened on both medial and lateral aspect of the left thigh.  There was a large hematoma laterally.  After cleansing and irrigation the wounds and the thigh were filled with Kerecis micro graft total 95 cm and the incisions were closed using 2-0 nylon.  Attention was then focused on the transtibial amputation this was open the muscle had no contractility there was poor color and poor consistency.  The muscle and soft tissue envelope was not viable.  This was irrigated with Vashe irrigation and the skin loosely closed with staples.  Attention was then focused on the right lower extremity.  The medial and lateral surgical incisions were open there was nonviable tissue that was excised with both a rondure and a 21 blade knife.  The muscle did not have good color but did have contractility.  Same as on the left thigh.  The wounds after debridement were irrigated with Vashe irrigation.  The incisions were closed loosely with 2-0 nylon.  Attention was then focused on the right transtibial amputation.  The muscle had no contractility poor color and poor consistency.  This was debrided and the wound irrigated with Vashe irrigation and loosely closed with staples.  A dry compressive dressing was applied to both lower extremities patient was extubated taken the PACU in stable condition.   Plan for return to the operating room on Friday.  Anticipate will need to proceed with bilateral above-the-knee amputations secondary to the nonviable transtibial amputations.

## 2022-11-12 NOTE — Progress Notes (Signed)
Am Progress Note  Pt progressing well toward goals.  Extra time spent on B LE exercise/warm up prior to mobility and lateral transfers to the chair via sliding board with min to max assist.    11/12/22 1144  PT Visit Information  Last PT Received On 11/12/22  Assistance Needed +2  History of Present Illness Patient is 32 y.o. female presented to hospital 10/03/22 for bil foot wounds, fever, and pain for ~2 weeks with no improvement with antibiotics. Pt admitted for sepsis with necrotizing fasciitis/gangrene bilateral feet elected for surgical management. Pt underwent excision and debridement of BLE extending feet to thighs on 10/08/22 and remained intubated in ICU. 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 PMH significant for DM, HTN, gastric pacemaker.  Precautions  Precautions Fall  Precaution Comments bil wound vac  Restrictions  Other Position/Activity Restrictions bil BKA  Pain Assessment  Pain Assessment 0-10  Pain Score 9  Faces Pain Scale 4  Pain Location L LE much worse than R LE  Pain Descriptors / Indicators Aching;Sore;Sharp  Pain Intervention(s) Limited activity within patient's tolerance;Monitored during session;Premedicated before session  Cognition  Arousal Alert  Behavior During Therapy WFL for tasks assessed/performed  Overall Cognitive Status Within Functional Limits for tasks assessed  Bed Mobility  Bed Mobility Supine to Sit  Supine to sit Contact guard (lower HOB)  General bed mobility comments extra time, but no assist  Transfers  Overall transfer level Needs assistance  Transfers Bed to chair/wheelchair/BSC  Bed to/from chair/wheelchair/BSC transfer type: Lateral/scoot transfer  Anterior-Posterior transfers Max assist;Mod assist (min/ mod scooting toward EOB, some use of padding.)   Lateral/Scoot Transfers With slide board  General transfer comment cued for best technique, pt managed balance in sitting, therapist managed stability at R LE.   mod to max assist getting onto and sliding down the SB with positioning in the chair.  Balance  Overall balance assessment Needs assistance  Sitting-balance support Bilateral upper extremity supported;No upper extremity supported;Feet unsupported  Sitting balance-Leahy Scale Fair  Sitting balance - Comments sitting EOB  Amputee Exercises  Quad Sets AROM;10 reps;Supine  Hip ABduction/ADduction AAROM;Both;10 reps;Supine  Straight Leg Raises AROM;Both;10 reps;Supine  Knee Flexion AROM;Both;10 reps;Supine  Hip Flexion/Marching AAROM;Both;10 reps;Supine  PT - End of Session  Equipment Utilized During Treatment  (sliding board)  Activity Tolerance Patient tolerated treatment well;Patient limited by pain  Patient left in chair;with call bell/phone within reach  Nurse Communication Mobility status   PT - Assessment/Plan  PT Visit Diagnosis Other abnormalities of gait and mobility (R26.89);Muscle weakness (generalized) (M62.81);Pain  Pain - Right/Left  (bil)  Pain - part of body Leg  PT Frequency (ACUTE ONLY) Min 1X/week  Recommendations for Other Services Rehab consult  Follow Up Recommendations Skilled nursing-short term rehab (<3 hours/day)  Can patient physically be transported by private vehicle No  Patient can return home with the following Two people to help with walking and/or transfers;Two people to help with bathing/dressing/bathroom;Assistance with cooking/housework;Assist for transportation;Help with stairs or ramp for entrance;Supervision due to cognitive status  PT equipment Wheelchair (measurements PT);Wheelchair cushion (measurements PT);Other (comment)  AM-PAC PT "6 Clicks" Mobility Outcome Measure (Version 2)  Help needed turning from your back to your side while in a flat bed without using bedrails? 3  Help needed moving from lying on your back to sitting on the side of a flat bed without using bedrails? 3  Help needed moving to and from a bed to a chair (including a  wheelchair)? 1  Help needed standing up from a chair using your arms (e.g., wheelchair or bedside chair)? 1  Help needed to walk in hospital room? 1  Help needed climbing 3-5 steps with a railing?  1  6 Click Score 10  Consider Recommendation of Discharge To: CIR/SNF/LTACH  Progressive Mobility  What is the highest level of mobility based on the progressive mobility assessment? Level 1 (Bedfast) - Unable to balance while sitting on edge of bed  Mobility Referral No  Activity Turned to right side;Transferred from bed to chair;Stood at bedside  PT Goal Progression  Progress towards PT goals Progressing toward goals  Acute Rehab PT Goals  PT Goal Formulation With patient  Time For Goal Achievement 11/08/22  Potential to Achieve Goals Good  PT Time Calculation  PT Start Time (ACUTE ONLY) 1054  PT Stop Time (ACUTE ONLY) 1133  PT Time Calculation (min) (ACUTE ONLY) 39 min  PT General Charges  $$ ACUTE PT VISIT 1 Visit  PT Treatments  $Therapeutic Exercise 23-37 mins  $Therapeutic Activity 8-22 mins   11/12/2022  Jacinto Halim., PT Acute Rehabilitation Services [phone]  (office)

## 2022-11-12 NOTE — Progress Notes (Signed)
PROGRESS NOTE    Kelli Hudson  ZOX:[id number] DOB: 07-07-1990 DOA: 10/03/2022 PCP: Norm Salt, PA    Brief Narrative:  32 y.o. female with medical history significant of DM1, diabetic gastroparesis with very frequent admission in the past, HTN presents to the hospital with bilateral foot wounds for about 2 weeks.  Not relieved with oral antibiotics.  She was admitted and treated with IV antibiotics but ultimately required extensive debridement and subsequent amputations for necrotizing fasciitis.   Chronology of events as below.  Since her surgery intermittently having anemia without any obvious signs of blood loss requiring PRBC transfusion, WBC still remains elevated but stable. Repeat CT showed persistent fluid collection, s/p OR 9/18, and again on 9/20 for repeat debridement.  Polymicrobial infection. 9/25, back to the OR for more debridement.    Assessment & Plan:   Principal Problem:   Necrotizing fasciitis (HCC) Active Problems:   AKI (acute kidney injury) (HCC)   Sepsis due to cellulitis (HCC)   Cellulitis of lower extremity   Anemia   Diabetic gastroparesis associated with type 1 diabetes mellitus (HCC)   DM type 1 (diabetes mellitus, type 1) (HCC)   Drug-seeking behavior   Sepsis (HCC)   Necrotizing fasciitis of pelvic region and thigh (HCC)   Necrotizing fasciitis of lower leg (HCC)   MDD (major depressive disorder), recurrent episode, moderate (HCC)   Severe sepsis due to bilateral feet abscess, necrotizing fasciitis, POA.  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 -Initially underwent I&D on 8/21 with wound VAC change on 8/26 but ultimately required bilateral BKA by orthopedic on 9/6.  - back to the OR on 9/18 by Dr Lajoyce Corners for further I&D, repeat cultures sent. - back to OR on 9/19, Back to the OR again debridement, amputation revision. Several abscesses encountered.  9/18, culture with steno and Klebsiella, MDR ESBL and  rare yeast 9/20, surgical culture with Klebsiella and steno.   On multiple antibiotics.  Currently on meropenem, Diflucan and Bactrim. Followed by ID. Plan for repeat washout /debride today by Dr. Lajoyce Corners.  Hypotension with tachycardia Stable today. continuing Midodrine 5 mg 3 times daily.  Acute blood loss anemia due to operative blood loss IV iron and multiple units of PRBC during her stay.  1 additional unit of PRBC 9/20.  Responded appropriately.  Will continue to monitor.     Type 1 diabetes, poorly controlled with intermittent hypoglycemia Blood sugars are controlled on long-acting insulin 4 units twice daily, sliding scale insulin.  Avoid hypoglycemia.  N.p.o. today.   Acute kidney injury on CKD stage IIIa due to septic ATN, improving Non-anion gap metabolic acidosis Hyponatremia, euvolemic Resolved now back to baseline creatinine .  Peaked at 2.54   Severe protein calorie malnutrition Encouraging oral diet, dietitian following.  Encourage oral diet Fortunately, she does not have gastroparesis symptoms these days and able to eat regular diet.  Occasionally using nausea medicine.   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  Currently not having active symptoms.  Occasionally using nausea medicine.   Hypertension IV as needed   Depression -On Cymbalta at home, continued    DVT prophylaxis: heparin   Code Status: Full Code  Family Communication: None today.    Status is: Inpatient Remains inpatient appropriate because: More inpatient procedures planned.  IV antibiotics.  Subjective:  Patient seen and examined.  No overnight events.  Aware about surgical procedure tomorrow.   Examination:  General: Looks comfortable.  Interactive.  Flat affect today. Cardiovascular: S1-S2  normal.  Regular rate rhythm. Respiratory: Bilateral clear.  No added sounds. Gastrointestinal: Soft.  Nontender.  Bowel sound present. Ext: Both lower extremity with below-knee amputation  stump on wound VAC Both inner thigh incisions on wound VAC. PICC line right arm. Neuro: Alert and awake.     Pressure Injury 10/07/22 Elbow Posterior;Right Stage 2 -  Partial thickness loss of dermis presenting as a shallow open injury with a red, pink wound bed without slough. (Active)  10/07/22 1226  Location: Elbow  Location Orientation: Posterior;Right  Staging: Stage 2 -  Partial thickness loss of dermis presenting as a shallow open injury with a red, pink wound bed without slough.  Wound Description (Comments):   Present on Admission:      Diet Orders (From admission, onward)     Start     Ordered   11/12/22 0430  Diet NPO time specified  Diet effective ____        11/11/22 1529            Objective: Vitals:   11/11/22 2032 11/11/22 2314 11/12/22 0313 11/12/22 1105  BP: 113/74 130/87 120/85 136/76  Pulse: 80 83 80 98  Resp:   13 19  Temp: 98 F (36.7 C) 97.9 F (36.6 C) 98.2 F (36.8 C) 98.9 F (37.2 C)  TempSrc: Oral Oral Oral Oral  SpO2: 100% 98% 97% 94%  Weight:   100.1 kg   Height:        Intake/Output Summary (Last 24 hours) at 11/12/2022 1251 Last data filed at 11/12/2022 1103 Gross per 24 hour  Intake 780 ml  Output 2050 ml  Net -1270 ml   Filed Weights   11/10/22 0500 11/11/22 0331 11/12/22 0313  Weight: 101.8 kg 100 kg 100.1 kg    Scheduled Meds:  acetaminophen  1,000 mg Oral Q6H   vitamin C  1,000 mg Oral Daily   Chlorhexidine Gluconate Cloth  6 each Topical Q0600   dextrose  50 mL Intravenous Once   docusate sodium  100 mg Oral BID   DULoxetine  30 mg Oral Daily   fluconazole  400 mg Oral Daily   gabapentin  300 mg Oral Q8H   heparin injection (subcutaneous)  5,000 Units Subcutaneous Q8H   hydrocortisone cream   Topical TID   insulin aspart  0-5 Units Subcutaneous QHS   insulin aspart  0-6 Units Subcutaneous TID WC   insulin glargine-yfgn  4 Units Subcutaneous BID   methocarbamol  1,000 mg Oral TID   metoCLOPramide  10 mg Oral  Q8H   metoprolol tartrate  25 mg Oral BID   midodrine  5 mg Oral TID WC   minocycline  200 mg Oral BID   multivitamin with minerals  1 tablet Oral Daily   nutrition supplement (JUVEN)  1 packet Oral BID BM   omeprazole  40 mg Oral Daily   povidone-iodine  2 Application Topical Once   silver nitrate applicators  1 Application Topical Once   sodium chloride flush  10-40 mL Intracatheter Q12H   sulfamethoxazole-trimethoprim  2 tablet Oral Q12H   Continuous Infusions:  sodium chloride 10 mL/hr at 11/12/22 1103   sodium chloride Stopped (11/06/22 0941)    ceFAZolin (ANCEF) IV     meropenem (MERREM) IV 1 g (11/12/22 0272)   methocarbamol (ROBAXIN) IV      Nutritional status Signs/Symptoms: estimated needs Interventions: Juven, Glucerna shake Body mass index is 34.56 kg/m.  Data Reviewed:   CBC: Recent Labs  Lab  11/07/22 1900 11/08/22 0713 11/09/22 0030 11/10/22 0350 11/11/22 0340  WBC 44.6* 37.4* 36.4* 35.1* 33.5*  HGB 6.3* 8.5* 8.0* 8.8* 8.7*  HCT 19.1* 25.7* 24.6* 27.6* 27.5*  MCV 91.8 91.1 93.2 95.2 93.2  PLT 408* 223 446* 565* 622*   Basic Metabolic Panel: Recent Labs  Lab 11/07/22 1900 11/08/22 0713 11/09/22 0030 11/10/22 0350 11/11/22 0340 11/12/22 0309  NA  --  129* 130* 131* 126* 129*  K  --  5.4* 4.9 5.1 5.1 5.5*  CL  --  104 104 105 101 105  CO2  --  16* 19* 19* 20* 20*  GLUCOSE  --  228* 198* 229* 162* 100*  BUN  --  45* 40* 35* 36* 35*  CREATININE  --  1.57* 1.38* 1.15* 1.07* 1.19*  CALCIUM  --  7.2* 7.2* 7.4* 7.3* 7.7*  MG 1.5*  --   --  1.8  --   --    GFR: Estimated Creatinine Clearance: 83.3 mL/min (A) (by C-G formula based on SCr of 1.19 mg/dL (H)). Liver Function Tests: No results for input(s): "AST", "ALT", "ALKPHOS", "BILITOT", "PROT", "ALBUMIN" in the last 168 hours. No results for input(s): "LIPASE", "AMYLASE" in the last 168 hours. No results for input(s): "AMMONIA" in the last 168 hours. Coagulation Profile: No results for  input(s): "INR", "PROTIME" in the last 168 hours. Cardiac Enzymes: No results for input(s): "CKTOTAL", "CKMB", "CKMBINDEX", "TROPONINI" in the last 168 hours. BNP (last 3 results) No results for input(s): "PROBNP" in the last 8760 hours. HbA1C: No results for input(s): "HGBA1C" in the last 72 hours. CBG: Recent Labs  Lab 11/11/22 1139 11/11/22 1559 11/11/22 2056 11/12/22 0629 11/12/22 1107  GLUCAP 156* 130* 115* 81 65*   Lipid Profile: No results for input(s): "CHOL", "HDL", "LDLCALC", "TRIG", "CHOLHDL", "LDLDIRECT" in the last 72 hours. Thyroid Function Tests: No results for input(s): "TSH", "T4TOTAL", "FREET4", "T3FREE", "THYROIDAB" in the last 72 hours. Anemia Panel: No results for input(s): "VITAMINB12", "FOLATE", "FERRITIN", "TIBC", "IRON", "RETICCTPCT" in the last 72 hours. Sepsis Labs: No results for input(s): "PROCALCITON", "LATICACIDVEN" in the last 168 hours.  Recent Results (from the past 240 hour(s))  MRSA Next Gen by PCR, Nasal     Status: None   Collection Time: 11/05/22  6:22 AM   Specimen: Nasal Mucosa; Nasal Swab  Result Value Ref Range Status   MRSA by PCR Next Gen NOT DETECTED NOT DETECTED Final    Comment: (NOTE) The GeneXpert MRSA Assay (FDA approved for NASAL specimens only), is one component of a comprehensive MRSA colonization surveillance program. It is not intended to diagnose MRSA infection nor to guide or monitor treatment for MRSA infections. Test performance is not FDA approved in patients less than 23 years old. Performed at [facility], [address] [place], [place] [address]   Aerobic/Anaerobic Culture w Gram Stain (surgical/deep wound)     Status: None   Collection Time: 11/05/22 11:46 AM   Specimen: PATH Cytology Misc. fluid; Body Fluid  Result Value Ref Range Status   Specimen Description FLUID LEFT KNEE EFFUSION  Final   Special Requests A  Final   Gram Stain   Final    ABUNDANT WBC PRESENT, PREDOMINANTLY PMN NO ORGANISMS  SEEN    Culture   Final    No growth aerobically or anaerobically. Performed at [facility], [address] [place] [address], [place], [place] [address]    Report Status 11/10/2022 FINAL  Final  Aerobic/Anaerobic Culture w Gram Stain (  surgical/deep wound)     Status: None   Collection Time: 11/05/22 11:49 AM   Specimen: Thigh; Wound  Result Value Ref Range Status   Specimen Description THIGH LEFT LATERAL  Final   Special Requests B  Final   Gram Stain   Final    ABUNDANT WBC PRESENT, PREDOMINANTLY PMN NO ORGANISMS SEEN    Culture   Final    RARE CANDIDA PARAPSILOSIS NO ANAEROBES ISOLATED Performed at [facility], [address] [place], [place] [address]    Report Status 11/10/2022 FINAL  Final  Aerobic/Anaerobic Culture w Gram Stain (surgical/deep wound)     Status: None   Collection Time: 11/05/22 11:51 AM   Specimen: Thigh; Wound  Result Value Ref Range Status   Specimen Description TISSUE THIGH LEFT MEDIAL  Final   Special Requests TISSUE C  Final   Gram Stain   Final    ABUNDANT WBC PRESENT, PREDOMINANTLY PMN NO ORGANISMS SEEN    Culture   Final    RARE STENOTROPHOMONAS MALTOPHILIA NO ANAEROBES ISOLATED Performed at [facility], [address] [place], [place] [address]    Report Status 11/10/2022 FINAL  Final   Organism ID, Bacteria STENOTROPHOMONAS MALTOPHILIA  Final      Susceptibility   Stenotrophomonas maltophilia - MIC*    LEVOFLOXACIN 0.5 SENSITIVE Sensitive     TRIMETH/SULFA <=20 SENSITIVE Sensitive     * RARE STENOTROPHOMONAS MALTOPHILIA  Aerobic/Anaerobic Culture w Gram Stain (surgical/deep wound)     Status: None   Collection Time: 11/05/22 11:55 AM   Specimen: Thigh; Wound  Result Value Ref Range Status   Specimen Description THIGH RIGHT MEDIAL  Final   Special Requests DIALYSIS  Final   Gram Stain   Final    ABUNDANT WBC PRESENT, PREDOMINANTLY PMN NO ORGANISMS SEEN    Culture   Final    FEW KLEBSIELLA PNEUMONIAE Confirmed Extended  Spectrum Beta-Lactamase Producer (ESBL).  In bloodstream infections from ESBL organisms, carbapenems are preferred over piperacillin/tazobactam. They are shown to have a lower risk of mortality. FEW STENOTROPHOMONAS MALTOPHILIA NO ANAEROBES ISOLATED Performed at [facility], [address] [place], [place] [address]    Report Status 11/10/2022 FINAL  Final   Organism ID, Bacteria KLEBSIELLA PNEUMONIAE  Final   Organism ID, Bacteria STENOTROPHOMONAS MALTOPHILIA  Final      Susceptibility   Klebsiella pneumoniae - MIC*    AMPICILLIN >=32 RESISTANT Resistant     CEFEPIME >=32 RESISTANT Resistant     CEFTAZIDIME RESISTANT Resistant     CEFTRIAXONE >=64 RESISTANT Resistant     CIPROFLOXACIN >=4 RESISTANT Resistant     GENTAMICIN >=16 RESISTANT Resistant     IMIPENEM <=0.25 SENSITIVE Sensitive     TRIMETH/SULFA >=320 RESISTANT Resistant     AMPICILLIN/SULBACTAM >=32 RESISTANT Resistant     PIP/TAZO 64 INTERMEDIATE Intermediate     * FEW KLEBSIELLA PNEUMONIAE   Stenotrophomonas maltophilia - MIC*    LEVOFLOXACIN 0.5 SENSITIVE Sensitive     TRIMETH/SULFA <=20 SENSITIVE Sensitive     * FEW STENOTROPHOMONAS MALTOPHILIA  Aerobic/Anaerobic Culture w Gram Stain (surgical/deep wound)     Status: None   Collection Time: 11/05/22 11:56 AM   Specimen: Thigh; Wound  Result Value Ref Range Status   Specimen Description THIGH RIGHT LATERAL  Final   Special Requests THIGH E  Final   Gram Stain   Final    ABUNDANT WBC PRESENT, PREDOMINANTLY PMN NO ORGANISMS SEEN    Culture  Final    No growth aerobically or anaerobically. Performed at [facility], [address] [place], [place] [address]    Report Status 11/10/2022 FINAL  Final  Culture, blood (Routine X 2) w Reflex to ID Panel     Status: None   Collection Time: 11/06/22  7:14 PM   Specimen: BLOOD  Result Value Ref Range Status   Specimen Description BLOOD SITE NOT SPECIFIED  Final   Special Requests   Final    BOTTLES  DRAWN AEROBIC AND ANAEROBIC Blood Culture adequate volume   Culture   Final    NO GROWTH 5 DAYS Performed at [facility], [address] [place], [place] [address]    Report Status 11/11/2022 FINAL  Final  Culture, blood (Routine X 2) w Reflex to ID Panel     Status: None   Collection Time: 11/06/22  7:14 PM   Specimen: BLOOD  Result Value Ref Range Status   Specimen Description BLOOD SITE NOT SPECIFIED  Final   Special Requests   Final    BOTTLES DRAWN AEROBIC AND ANAEROBIC Blood Culture adequate volume   Culture   Final    NO GROWTH 5 DAYS Performed at [facility], [address], [place], [place] [address]    Report Status 11/11/2022 FINAL  Final  Aerobic/Anaerobic Culture w Gram Stain (surgical/deep wound)     Status: None (Preliminary result)   Collection Time: 11/07/22 12:00 PM   Specimen: Wound; Tissue  Result Value Ref Range Status   Specimen Description WOUND  Final   Special Requests medial left leg  Final   Gram Stain   Final    FEW WBC PRESENT, PREDOMINANTLY PMN NO ORGANISMS SEEN Performed at [facility], [address] [place], [place] [address]    Culture   Final    RARE STENOTROPHOMONAS MALTOPHILIA SUSCEPTIBILITIES PERFORMED ON PREVIOUS CULTURE WITHIN THE LAST 5 DAYS. NO ANAEROBES ISOLATED; CULTURE IN PROGRESS FOR 5 DAYS    Report Status PENDING  Incomplete  Aerobic/Anaerobic Culture w Gram Stain (surgical/deep wound)     Status: None (Preliminary result)   Collection Time: 11/07/22 12:18 PM   Specimen: Wound; Tissue  Result Value Ref Range Status   Specimen Description TISSUE  Final   Special Requests right BKA TISSUE  Final   Gram Stain   Final    FEW WBC PRESENT, PREDOMINANTLY PMN NO ORGANISMS SEEN Performed at [facility], [address], [place], [place] [address]    Culture   Final    RARE KLEBSIELLA PNEUMONIAE RARE STENOTROPHOMONAS MALTOPHILIA SUSCEPTIBILITIES PERFORMED ON PREVIOUS CULTURE WITHIN THE LAST 5 DAYS. NO  ANAEROBES ISOLATED; CULTURE IN PROGRESS FOR 5 DAYS    Report Status PENDING  Incomplete  Aerobic/Anaerobic Culture w Gram Stain (surgical/deep wound)     Status: None (Preliminary result)   Collection Time: 11/07/22 12:27 PM   Specimen: Wound; Tissue  Result Value Ref Range Status   Specimen Description TISSUE  Final   Special Requests lateral left thigh  Final   Gram Stain   Final    RARE WBC PRESENT, PREDOMINANTLY PMN NO ORGANISMS SEEN Performed at [facility], [address], [place], [place] [address]    Culture   Final    RARE KLEBSIELLA PNEUMONIAE RARE STENOTROPHOMONAS MALTOPHILIA NO ANAEROBES ISOLATED; CULTURE IN PROGRESS FOR 5 DAYS    Report Status PENDING  Incomplete   Organism ID, Bacteria STENOTROPHOMONAS MALTOPHILIA  Final  Organism ID, Bacteria KLEBSIELLA PNEUMONIAE  Final      Susceptibility   Klebsiella pneumoniae - MIC*    AMPICILLIN >=32 RESISTANT Resistant     CEFEPIME <=0.12 SENSITIVE Sensitive     CEFTAZIDIME <=1 SENSITIVE Sensitive     CEFTRIAXONE <=0.25 SENSITIVE Sensitive     CIPROFLOXACIN >=4 RESISTANT Resistant     GENTAMICIN >=16 RESISTANT Resistant     IMIPENEM <=0.25 SENSITIVE Sensitive     TRIMETH/SULFA <=20 SENSITIVE Sensitive     AMPICILLIN/SULBACTAM >=32 RESISTANT Resistant     PIP/TAZO 32 INTERMEDIATE Intermediate     * RARE KLEBSIELLA PNEUMONIAE   Stenotrophomonas maltophilia - MIC*    LEVOFLOXACIN 0.5 SENSITIVE Sensitive     TRIMETH/SULFA <=20 SENSITIVE Sensitive     * RARE STENOTROPHOMONAS MALTOPHILIA  Aerobic/Anaerobic Culture w Gram Stain (surgical/deep wound)     Status: None (Preliminary result)   Collection Time: 11/07/22 12:27 PM   Specimen: Wound; Tissue  Result Value Ref Range Status   Specimen Description TISSUE  Final   Special Requests left BKA stamp  Final   Gram Stain   Final    FEW WBC PRESENT,BOTH PMN AND MONONUCLEAR NO ORGANISMS SEEN Performed at [facility], [address], [place], [place] [address]     Culture   Final    RARE KLEBSIELLA PNEUMONIAE SUSCEPTIBILITIES PERFORMED ON PREVIOUS CULTURE WITHIN THE LAST 5 DAYS. NO ANAEROBES ISOLATED; CULTURE IN PROGRESS FOR 5 DAYS    Report Status PENDING  Incomplete         Radiology Studies: No results found.         LOS: 40 days   Time spent= 35 mins

## 2022-11-12 NOTE — Anesthesia Procedure Notes (Signed)
Procedure Name: Intubation Date/Time: 11/12/2022 4:16 PM  Performed by: Earlene Plater, CRNAPre-anesthesia Checklist: Patient identified, Emergency Drugs available, Suction available, Patient being monitored and Timeout performed Patient Re-evaluated:Patient Re-evaluated prior to induction Oxygen Delivery Method: Circle system utilized and Simple face mask Preoxygenation: Pre-oxygenation with 100% oxygen Induction Type: IV induction Ventilation: Mask ventilation without difficulty Laryngoscope Size: Glidescope and 4 Grade View: Grade I Tube type: Oral Tube size: 7.5 mm Number of attempts: 1 Airway Equipment and Method: Stylet Placement Confirmation: ETT inserted through vocal cords under direct vision, positive ETCO2, CO2 detector and breath sounds checked- equal and bilateral Secured at: 22 (@ lips; verified by Dr. Glade Stanford) cm Tube secured with: Tape Dental Injury: Teeth and Oropharynx as per pre-operative assessment

## 2022-11-12 NOTE — Progress Notes (Signed)
CCC Pre-op Review 1.Surgical orders: Consent orders and signed Is patient alert and oriented to sign consent Antibiotic: Anceg 2G OCTOR  2.  Pre-procedure checklist completed:   3.  NPO:  4.  CHG Bath completed:  Completed 0505 11/12/23 Belongings removed  Placed in clean gown  5.  Labs Performed: CBC or   Abnormal 11/11/22 0404 CMP--BMP   Abnormal 11/12/22 0309 Type and Screen Expires 11/09/22 Surgical PCR:  11/05/22 Not detected  Profend due 1300 11/12/22 Pregnancy:  10/22/22 Negative POCT EKG:    10/08/22 Chest x-ray:  10/08/22  6.  Recent H&P or progress note if inpatient: Progress note 11/11/23  7.  Language Barrier:  NA  8. Stable Vital Signs   Stable Oxygen:   RA Tele:      9.  Medications: Cardiac Drips:  Pain Medications: Dilaudid 0.5mg  LD 0624 9/95/24 Beta Blocker:   NA Anticoagulants:  Heparin LD 2233 11/11/22 GLP1:   NA Pre-op Medications Ordered:  10. IV access:  PICC DL Right Brachial  11. Diabetic:     CBG:  81     @    0629 11/12/22   Perioperative Glycemic Control Scale:    Moderate    Chat message with Alfredo Bach, RN Hello, I'm an RN working at [facility] helping to expedite add on inpatient surgical cases today. I see this patient has been posted for surgery @ 13:55 Is this patient currently NPO? Have they signed the consent form?     If you haven't already, please complete pre-procedure checklist.  I see CHG bath was completed this morning.  Please have the patient change into a clean hospital gown, remove all jewelry/belongings and have them brush teeth please.  The Short Stay RN will still reach out for report and provide a time they will come for the patient.

## 2022-11-12 NOTE — Anesthesia Preprocedure Evaluation (Signed)
Anesthesia Evaluation  Patient identified by MRN, date of birth, ID band Patient awake    Reviewed: Allergy & Precautions, NPO status , Patient's Chart, lab work & pertinent test results  Airway Mallampati: II  TM Distance: >3 FB Neck ROM: Full    Dental no notable dental hx.    Pulmonary neg pulmonary ROS   Pulmonary exam normal        Cardiovascular hypertension, Pt. on home beta blockers and Pt. on medications Normal cardiovascular exam     Neuro/Psych  PSYCHIATRIC DISORDERS Anxiety Depression     Neuromuscular disease    GI/Hepatic Neg liver ROS, PUD,GERD  Medicated,,  Endo/Other  diabetes, Type 1, Insulin Dependent    Renal/GU Renal InsufficiencyRenal disease     Musculoskeletal negative musculoskeletal ROS (+)    Abdominal  (+) + obese  Peds  Hematology  (+) Blood dyscrasia, anemia   Anesthesia Other Findings Cellulitis Bilateral Thighs  Reproductive/Obstetrics Hcg negative                              Anesthesia Physical Anesthesia Plan  ASA: 3  Anesthesia Plan: General   Post-op Pain Management:    Induction: Intravenous  PONV Risk Score and Plan: 3 and Dexamethasone and Treatment may vary due to age or medical condition  Airway Management Planned: Oral ETT  Additional Equipment: None  Intra-op Plan:   Post-operative Plan: Extubation in OR  Informed Consent: I have reviewed the patients History and Physical, chart, labs and discussed the procedure including the risks, benefits and alternatives for the proposed anesthesia with the patient or authorized representative who has indicated his/her understanding and acceptance.     Dental advisory given  Plan Discussed with: CRNA and Anesthesiologist  Anesthesia Plan Comments:          Anesthesia Quick Evaluation

## 2022-11-13 ENCOUNTER — Encounter (HOSPITAL_COMMUNITY): Payer: Self-pay | Admitting: Orthopedic Surgery

## 2022-11-13 DIAGNOSIS — M726 Necrotizing fasciitis: Secondary | ICD-10-CM | POA: Diagnosis not present

## 2022-11-13 LAB — CBC
HCT: 19.4 % — ABNORMAL LOW (ref 36.0–46.0)
Hemoglobin: 6.2 g/dL — CL (ref 12.0–15.0)
MCH: 30.2 pg (ref 26.0–34.0)
MCHC: 32 g/dL (ref 30.0–36.0)
MCV: 94.6 fL (ref 80.0–100.0)
Platelets: 544 10*3/uL — ABNORMAL HIGH (ref 150–400)
RBC: 2.05 MIL/uL — ABNORMAL LOW (ref 3.87–5.11)
RDW: 15.3 % (ref 11.5–15.5)
WBC: 31.2 10*3/uL — ABNORMAL HIGH (ref 4.0–10.5)
nRBC: 0 % (ref 0.0–0.2)

## 2022-11-13 LAB — BASIC METABOLIC PANEL
Anion gap: 4 — ABNORMAL LOW (ref 5–15)
BUN: 32 mg/dL — ABNORMAL HIGH (ref 6–20)
CO2: 18 mmol/L — ABNORMAL LOW (ref 22–32)
Calcium: 6.7 mg/dL — ABNORMAL LOW (ref 8.9–10.3)
Chloride: 108 mmol/L (ref 98–111)
Creatinine, Ser: 1.6 mg/dL — ABNORMAL HIGH (ref 0.44–1.00)
GFR, Estimated: 44 mL/min — ABNORMAL LOW (ref >60)
Glucose, Bld: 195 mg/dL — ABNORMAL HIGH (ref 70–99)
Potassium: 5.7 mmol/L — ABNORMAL HIGH (ref 3.5–5.1)
Sodium: 130 mmol/L — ABNORMAL LOW (ref 135–145)

## 2022-11-13 LAB — GLUCOSE, CAPILLARY
Glucose-Capillary: 197 mg/dL — ABNORMAL HIGH (ref 70–99)
Glucose-Capillary: 170 mg/dL — ABNORMAL HIGH (ref 70–99)
Glucose-Capillary: 167 mg/dL — ABNORMAL HIGH (ref 70–99)
Glucose-Capillary: 119 mg/dL — ABNORMAL HIGH (ref 70–99)

## 2022-11-13 LAB — TYPE AND SCREEN
Unit division: 0
Unit division: 0

## 2022-11-13 LAB — BPAM RBC
Blood Product Expiration Date: 202410222359
Blood Product Expiration Date: 202410222359
ISSUE DATE / TIME: 202409261121
ISSUE DATE / TIME: 202409261521
Unit Type and Rh: 5100
Unit Type and Rh: 5100

## 2022-11-13 LAB — AEROBIC/ANAEROBIC CULTURE W GRAM STAIN (SURGICAL/DEEP WOUND)

## 2022-11-13 LAB — PREPARE RBC (CROSSMATCH)

## 2022-11-13 LAB — HEMOGLOBIN AND HEMATOCRIT, BLOOD
HCT: 25.8 % — ABNORMAL LOW (ref 36.0–46.0)
Hemoglobin: 8.5 g/dL — ABNORMAL LOW (ref 12.0–15.0)

## 2022-11-13 MED ORDER — CEFAZOLIN SODIUM-DEXTROSE 2-4 GM/100ML-% IV SOLN
2.0000 g | INTRAVENOUS | Status: AC
  Administered 2022-11-14: 2 g via INTRAVENOUS
  Filled 2022-11-13: qty 100

## 2022-11-13 MED ORDER — TRANEXAMIC ACID-NACL 1000-0.7 MG/100ML-% IV SOLN
1000.0000 mg | INTRAVENOUS | Status: AC
  Administered 2022-11-14: 1000 mg via INTRAVENOUS
  Filled 2022-11-13 (×2): qty 100

## 2022-11-13 MED ORDER — POVIDONE-IODINE 10 % EX SWAB
2.0000 | Freq: Once | CUTANEOUS | Status: AC
  Administered 2022-11-14: 2 via TOPICAL

## 2022-11-13 MED ORDER — SODIUM CHLORIDE 0.9% IV SOLUTION: Freq: Once | INTRAVENOUS | Status: DC

## 2022-11-13 MED ORDER — HYDROMORPHONE 1 MG/ML IV SOLN
INTRAVENOUS | Status: DC
  Administered 2022-11-13: 30 mg via INTRAVENOUS
  Administered 2022-11-14: 0.4 mg via INTRAVENOUS
  Administered 2022-11-14: 1.4 mg via INTRAVENOUS
  Filled 2022-11-13: qty 30

## 2022-11-13 MED ORDER — CHLORHEXIDINE GLUCONATE CLOTH 2 % EX PADS
6.0000 | MEDICATED_PAD | Freq: Every day | CUTANEOUS | Status: DC
  Administered 2022-11-14 – 2022-11-22 (×9): 6 via TOPICAL

## 2022-11-13 MED ORDER — TRANEXAMIC ACID 1000 MG/10ML IV SOLN
2000.0000 mg | INTRAVENOUS | Status: DC
  Filled 2022-11-13: qty 20

## 2022-11-13 MED ORDER — SODIUM CHLORIDE 0.9% IV SOLUTION: Freq: Once | INTRAVENOUS | Status: AC

## 2022-11-13 MED ORDER — HYDROMORPHONE HCL 1 MG/ML IJ SOLN
0.5000 mg | INTRAMUSCULAR | Status: DC | PRN
  Administered 2022-11-13 (×2): 0.5 mg via INTRAVENOUS
  Filled 2022-11-13: qty 0.5

## 2022-11-13 MED ORDER — CHLORHEXIDINE GLUCONATE 4 % EX SOLN
60.0000 mL | Freq: Once | CUTANEOUS | Status: AC
  Administered 2022-11-13: 4 via TOPICAL
  Filled 2022-11-13: qty 15

## 2022-11-13 NOTE — Progress Notes (Signed)
Date and time results received: 11/13/22 0624 (use smartphrase ".now" to insert current time)  Test: Hgb Critical Value: 6.2  Name of Provider Notified: Dr. Annell Greening  Orders Received? Or Actions Taken?: Orders Received - See Orders for details. 2 Units of PRBCs were ordered.

## 2022-11-13 NOTE — Progress Notes (Signed)
PT Cancellation Note  Patient Details Name: Kelli Hudson MRN: [id number] DOB: 05-02-1990   Cancelled Treatment:    Reason Eval/Treat Not Completed: Patient declined, no reason specified.  Pt having a hard time today.  Hold per RN. 11/13/2022  Jacinto Halim., PT Acute Rehabilitation Services [phone]  (office)   Eliseo Gum [place] 11/13/2022, 11:02 AM

## 2022-11-13 NOTE — Plan of Care (Signed)
Problem: Education: Goal: Knowledge of General Education information will improve Description: Including pain rating scale, medication(s)/side effects and non-pharmacologic comfort measures Outcome: Progressing   Problem: Health Behavior/Discharge Planning: Goal: Ability to manage health-related needs will improve Outcome: Progressing   Problem: Clinical Measurements: Goal: Ability to maintain clinical measurements within normal limits will improve Outcome: Progressing Goal: Will remain free from infection Outcome: Progressing Goal: Diagnostic test results will improve Outcome: Progressing Goal: Respiratory complications will improve Outcome: Progressing Goal: Cardiovascular complication will be avoided Outcome: Progressing   Problem: Activity: Goal: Risk for activity intolerance will decrease Outcome: Progressing   Problem: Nutrition: Goal: Adequate nutrition will be maintained Outcome: Progressing   Problem: Coping: Goal: Level of anxiety will decrease Outcome: Progressing   Problem: Elimination: Goal: Will not experience complications related to bowel motility Outcome: Progressing Goal: Will not experience complications related to urinary retention Outcome: Progressing   Problem: Pain Managment: Goal: General experience of comfort will improve Outcome: Progressing   Problem: Safety: Goal: Ability to remain free from injury will improve Outcome: Progressing   Problem: Skin Integrity: Goal: Risk for impaired skin integrity will decrease Outcome: Progressing   Problem: Education: Goal: Ability to describe self-care measures that may prevent or decrease complications (Diabetes Survival Skills Education) will improve Outcome: Progressing   Problem: Coping: Goal: Ability to adjust to condition or change in health will improve Outcome: Progressing   Problem: Fluid Volume: Goal: Ability to maintain a balanced intake and output will improve Outcome:  Progressing   Problem: Health Behavior/Discharge Planning: Goal: Ability to identify and utilize available resources and services will improve Outcome: Progressing Goal: Ability to manage health-related needs will improve Outcome: Progressing   Problem: Metabolic: Goal: Ability to maintain appropriate glucose levels will improve Outcome: Progressing   Problem: Nutritional: Goal: Maintenance of adequate nutrition will improve Outcome: Progressing Goal: Progress toward achieving an optimal weight will improve Outcome: Progressing   Problem: Skin Integrity: Goal: Risk for impaired skin integrity will decrease Outcome: Progressing   Problem: Tissue Perfusion: Goal: Adequacy of tissue perfusion will improve Outcome: Progressing   Problem: Education: Goal: Knowledge of the prescribed therapeutic regimen will improve Outcome: Progressing Goal: Ability to verbalize activity precautions or restrictions will improve Outcome: Progressing Goal: Understanding of discharge needs will improve Outcome: Progressing   Problem: Activity: Goal: Ability to perform//tolerate increased activity and mobilize with assistive devices will improve Outcome: Progressing   Problem: Clinical Measurements: Goal: Postoperative complications will be avoided or minimized Outcome: Progressing   Problem: Self-Care: Goal: Ability to meet self-care needs will improve Outcome: Progressing   Problem: Self-Concept: Goal: Ability to maintain and perform role responsibilities to the fullest extent possible will improve Outcome: Progressing   Problem: Pain Management: Goal: Pain level will decrease with appropriate interventions Outcome: Progressing   Problem: Skin Integrity: Goal: Demonstration of wound healing without infection will improve Outcome: Progressing

## 2022-11-13 NOTE — Anesthesia Postprocedure Evaluation (Signed)
Anesthesia Post Note  Patient: Network engineer  Procedure(s) Performed: BILATERAL LEG DEBRIDEMENTS (Bilateral: Leg Upper)     Patient location during evaluation: PACU Anesthesia Type: General Level of consciousness: awake and alert Pain management: pain level controlled Vital Signs Assessment: post-procedure vital signs reviewed and stable Respiratory status: spontaneous breathing, nonlabored ventilation, respiratory function stable and patient connected to nasal cannula oxygen Cardiovascular status: blood pressure returned to baseline and stable Postop Assessment: no apparent nausea or vomiting Anesthetic complications: no   No notable events documented.  Last Vitals:  Vitals:   11/12/22 2146 11/12/22 2335  BP: 117/74 107/64  Pulse: 89 86  Resp:  11  Temp:  36.8 C  SpO2:  100%    Last Pain:  Vitals:   11/13/22 0558  TempSrc:   PainSc: Asleep                 [place]

## 2022-11-13 NOTE — H&P (View-Only) (Signed)
Patient ID: Kelli Hudson, female   DOB: 1990/12/03, 32 y.o.   MRN: [id number] Patient is postoperative day 1 repeat debridement both lower extremities.  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  There was poor color poor consistency and no contractility.  I discussed with the patient this morning and with her mother recommendation to proceed with bilateral above-knee amputations tomorrow.  Plan for n.p.o. after midnight with bilateral above-knee amputation surgery tomorrow.  White cell count still elevated at 31,000.  Hemoglobin 6.2.  I will write orders to type and cross and transfuse with 2 units today.

## 2022-11-13 NOTE — Progress Notes (Signed)
Patient ID: Kelli Hudson, female   DOB: 1990/12/03, 32 y.o.   MRN: [id number] Patient is postoperative day 1 repeat debridement both lower extremities.  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  There was poor color poor consistency and no contractility.  I discussed with the patient this morning and with her mother recommendation to proceed with bilateral above-knee amputations tomorrow.  Plan for n.p.o. after midnight with bilateral above-knee amputation surgery tomorrow.  White cell count still elevated at 31,000.  Hemoglobin 6.2.  I will write orders to type and cross and transfuse with 2 units today.

## 2022-11-13 NOTE — TOC Progression Note (Signed)
Transition of Care [facility]) - Progression Note    Patient Details  Name: Kelli Hudson MRN: [id number] Date of Birth: Jan 27, 1991  Transition of Care [facility]) CM/SW Contact  Delilah Shan, LCSWA Phone Number: 11/13/2022, 3:56 PM  Clinical Narrative:     Patient has SN[facility] bed at [facility] when medically ready. Insurance authorization good through 9/29. CSW will continue to follow.  Expected Discharge Plan: Skilled Nursing Facility Barriers to Discharge: Continued Medical Work up, SNF Pending bed offer  Expected Discharge Plan and Services In-house Referral: Clinical Social Work   Post Acute Care Choice: Skilled Nursing Facility Living arrangements for the past 2 months: Apartment                                       Social Determinants of Health (SDOH) Interventions SDOH Screenings   Food Insecurity: No Food Insecurity (10/03/2022)  Housing: Low Risk  (10/03/2022)  Transportation Needs: No Transportation Needs (10/03/2022)  Utilities: Not At Risk (10/03/2022)  Alcohol Screen: Low Risk  (05/29/2021)  Depression (PHQ2-9): Low Risk  (05/29/2021)  Recent Concern: Depression (PHQ2-9) - Medium Risk (04/10/2021)  Financial Resource Strain: Low Risk  (02/13/2022)   Received from [facility]  Social Connections: Unknown (06/17/2021)   Received from [facility]  Stress: No Stress Concern Present (09/16/2022)   Received from [facility]  Tobacco Use: Low Risk  (11/12/2022)    Readmission Risk Interventions    05/08/2022    9:45 AM 03/24/2022    3:33 PM 12/12/2021    8:59 AM  Readmission Risk Prevention Plan  Transportation Screening Complete Complete Complete  Medication Review Oceanographer) Complete Complete Complete  PCP or Specialist appointment within 3-5 days of discharge Complete Complete Complete  HRI or Home Care Consult Complete Complete Complete  SW Recovery Care/Counseling Consult Complete Complete Complete  Palliative  Care Screening Not Applicable Not Applicable Not Applicable  Skilled Nursing Facility Not Applicable Not Applicable Not Applicable

## 2022-11-13 NOTE — Progress Notes (Signed)
PROGRESS NOTE    Kelli Hudson  XBJ:[id number] DOB: 1990-09-27 DOA: 10/03/2022 PCP: Norm Salt, PA    Brief Narrative:  32 y.o. female with medical history significant of DM1, diabetic gastroparesis with very frequent admission in the past, HTN presents to the hospital with bilateral foot wounds for about 2 weeks.  Not relieved with oral antibiotics.  She was admitted and treated with IV antibiotics but ultimately required extensive debridement and subsequent amputations for necrotizing fasciitis.   Chronology of events as below.  Since her surgery intermittently having anemia without any obvious signs of blood loss requiring PRBC transfusion, WBC still remains elevated but stable. Repeat CT showed persistent fluid collection, s/p OR 9/18, and again on 9/20 for repeat debridement.  Polymicrobial infection. 9/25, back to the OR for more debridement.  Nonviable tissue on below-knee amputation stumps and thigh wounds.    Assessment & Plan:   Principal Problem:   Necrotizing fasciitis (HCC) Active Problems:   AKI (acute kidney injury) (HCC)   Sepsis due to cellulitis (HCC)   Cellulitis of lower extremity   Anemia   Diabetic gastroparesis associated with type 1 diabetes mellitus (HCC)   DM type 1 (diabetes mellitus, type 1) (HCC)   Drug-seeking behavior   Sepsis (HCC)   Necrotizing fasciitis of pelvic region and thigh (HCC)   Necrotizing fasciitis of lower leg (HCC)   MDD (major depressive disorder), recurrent episode, moderate (HCC)   Severe sepsis due to bilateral feet abscess, necrotizing fasciitis, POA.  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 -I&D 8/21 -Bilateral BKA on 9/6 -Back to OR and multiple debridements 9/18, 9/19, 9/25.  Amputation revisions. -Plan for above-knee amputation and further debridements.  Poor prognosis. 9/18, culture with steno and Klebsiella, MDR ESBL and rare yeast 9/20, surgical culture with Klebsiella  and steno.   On multiple antibiotics.  Currently on meropenem, Diflucan and Bactrim. Followed by ID. Severe painful condition.  Increased frequency of injectable Dilaudid, if not controlled will start patient on PCA pump.  Toradol   Hypotension with tachycardia Stable today. continuing Midodrine 5 mg 3 times daily.  Acute blood loss anemia due to operative blood loss Total 14 units of blood transfusion including 2 today.    Type 1 diabetes, poorly controlled with intermittent hypoglycemia Blood sugars are controlled on long-acting insulin 4 units twice daily, sliding scale insulin.  Avoid hypoglycemia.     Acute kidney injury on CKD stage IIIa due to septic ATN, improving Non-anion gap metabolic acidosis, hyperkalemia. Hyponatremia, euvolemic Resolved now back to baseline creatinine .  Peaked at 2.54 Monitor potassium.   Severe protein calorie malnutrition Encouraging oral diet, dietitian following.  Encourage oral diet Fortunately, she does not have gastroparesis symptoms these days and able to eat regular diet.  Occasionally using nausea medicine.   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  Currently not having active symptoms.  Occasionally using nausea medicine.   Hypertension IV as needed   Depression -On Cymbalta at home, continued    DVT prophylaxis: heparin   Code Status: Full Code  Family Communication: Mother and aunt on the phone.  Status is: Inpatient Remains inpatient appropriate because: More inpatient procedures planned.  IV antibiotics.  Subjective:  Patient seen and examined.  No overnight events.  Aware about surgical procedure tomorrow.   Examination:  General: Patient is anxious, tearful.  Appropriately in distress due to pain. Cardiovascular: S1-S2 normal.  Regular rate rhythm. Respiratory: Bilateral clear.  No added sounds. Gastrointestinal: Soft.  Nontender.  Bowel sound present. Ext: Lower extremities on bulky bandages, multiple  dressings.  Not removed by me. PICC line right arm. Neuro: Alert and awake.          Diet Orders (From admission, onward)     Start     Ordered   11/12/22 1838  Diet Carb Modified Fluid consistency: Thin; Room service appropriate? Yes with Assist  Diet effective now       Question Answer Comment  Calorie Level Medium 1600-2000   Fluid consistency: Thin   Room service appropriate? Yes with Assist      11/12/22 1837            Objective: Vitals:   11/12/22 2017 11/12/22 2037 11/12/22 2146 11/12/22 2335  BP:  105/61 117/74 107/64  Pulse:  85 89 86  Resp: 19 19  11   Temp: 98.4 F (36.9 C) 98.4 F (36.9 C)  98.3 F (36.8 C)  TempSrc: Oral Oral  Oral  SpO2:  100%  100%  Weight:      Height:        Intake/Output Summary (Last 24 hours) at 11/13/2022 1002 Last data filed at 11/13/2022 0853 Gross per 24 hour  Intake 1085.86 ml  Output 700 ml  Net 385.86 ml   Filed Weights   11/10/22 0500 11/11/22 0331 11/12/22 0313  Weight: 101.8 kg 100 kg 100.1 kg    Scheduled Meds:  sodium chloride   Intravenous Once   sodium chloride   Intravenous Once   acetaminophen  1,000 mg Oral Q6H   vitamin C  1,000 mg Oral Daily   Chlorhexidine Gluconate Cloth  6 each Topical Q0600   dextrose  50 mL Intravenous Once   docusate sodium  100 mg Oral BID   docusate sodium  100 mg Oral Daily   DULoxetine  30 mg Oral Daily   fluconazole  400 mg Oral Daily   gabapentin  300 mg Oral Q8H   heparin injection (subcutaneous)  5,000 Units Subcutaneous Q8H   hydrocortisone cream   Topical TID   insulin aspart  0-5 Units Subcutaneous QHS   insulin aspart  0-6 Units Subcutaneous TID WC   insulin glargine-yfgn  4 Units Subcutaneous BID   methocarbamol  1,000 mg Oral TID   metoCLOPramide  10 mg Oral Q8H   metoprolol tartrate  25 mg Oral BID   midodrine  5 mg Oral TID WC   minocycline  200 mg Oral BID   multivitamin with minerals  1 tablet Oral Daily   nutrition supplement (JUVEN)  1 packet  Oral BID BM   nutrition supplement (JUVEN)  1 packet Oral BID BM   omeprazole  40 mg Oral Daily   silver nitrate applicators  1 Application Topical Once   sodium chloride flush  10-40 mL Intracatheter Q12H   sulfamethoxazole-trimethoprim  2 tablet Oral Q12H   zinc sulfate  220 mg Oral Daily   Continuous Infusions:  sodium chloride 10 mL/hr at 11/12/22 1103   sodium chloride 75 mL/hr at 11/12/22 2158   magnesium sulfate bolus IVPB     meropenem (MERREM) IV 1 g (11/13/22 0744)   methocarbamol (ROBAXIN) IV 500 mg (11/13/22 0542)    Nutritional status Signs/Symptoms: estimated needs Interventions: Juven, Glucerna shake Body mass index is 34.56 kg/m.  Data Reviewed:   CBC: Recent Labs  Lab 11/08/22 0713 11/09/22 0030 11/10/22 0350 11/11/22 0340 11/13/22 0600  WBC 37.4* 36.4* 35.1* 33.5* 31.2*  HGB 8.5* 8.0* 8.8* 8.7*  6.2*  HCT 25.7* 24.6* 27.6* 27.5* 19.4*  MCV 91.1 93.2 95.2 93.2 94.6  PLT 223 446* 565* 622* 544*   Basic Metabolic Panel: Recent Labs  Lab 11/07/22 1900 11/08/22 0713 11/09/22 0030 11/10/22 0350 11/11/22 0340 11/12/22 0309 11/13/22 0600  NA  --    < > 130* 131* 126* 129* 130*  K  --    < > 4.9 5.1 5.1 5.5* 5.7*  CL  --    < > 104 105 101 105 108  CO2  --    < > 19* 19* 20* 20* 18*  GLUCOSE  --    < > 198* 229* 162* 100* 195*  BUN  --    < > 40* 35* 36* 35* 32*  CREATININE  --    < > 1.38* 1.15* 1.07* 1.19* 1.60*  CALCIUM  --    < > 7.2* 7.4* 7.3* 7.7* 6.7*  MG 1.5*  --   --  1.8  --   --   --    < > = values in this interval not displayed.   GFR: Estimated Creatinine Clearance: 61.9 mL/min (A) (by C-G formula based on SCr of 1.6 mg/dL (H)). Liver Function Tests: No results for input(s): "AST", "ALT", "ALKPHOS", "BILITOT", "PROT", "ALBUMIN" in the last 168 hours. No results for input(s): "LIPASE", "AMYLASE" in the last 168 hours. No results for input(s): "AMMONIA" in the last 168 hours. Coagulation Profile: No results for input(s): "INR",  "PROTIME" in the last 168 hours. Cardiac Enzymes: No results for input(s): "CKTOTAL", "CKMB", "CKMBINDEX", "TROPONINI" in the last 168 hours. BNP (last 3 results) No results for input(s): "PROBNP" in the last 8760 hours. HbA1C: No results for input(s): "HGBA1C" in the last 72 hours. CBG: Recent Labs  Lab 11/12/22 1605 11/12/22 1731 11/12/22 1821 11/12/22 2114 11/13/22 0602  GLUCAP 78 81 69* 130* 197*   Lipid Profile: No results for input(s): "CHOL", "HDL", "LDLCALC", "TRIG", "CHOLHDL", "LDLDIRECT" in the last 72 hours. Thyroid Function Tests: No results for input(s): "TSH", "T4TOTAL", "FREET4", "T3FREE", "THYROIDAB" in the last 72 hours. Anemia Panel: No results for input(s): "VITAMINB12", "FOLATE", "FERRITIN", "TIBC", "IRON", "RETICCTPCT" in the last 72 hours. Sepsis Labs: No results for input(s): "PROCALCITON", "LATICACIDVEN" in the last 168 hours.  Recent Results (from the past 240 hour(s))  MRSA Next Gen by PCR, Nasal     Status: None   Collection Time: 11/05/22  6:22 AM   Specimen: Nasal Mucosa; Nasal Swab  Result Value Ref Range Status   MRSA by PCR Next Gen NOT DETECTED NOT DETECTED Final    Comment: (NOTE) The GeneXpert MRSA Assay (FDA approved for NASAL specimens only), is one component of a comprehensive MRSA colonization surveillance program. It is not intended to diagnose MRSA infection nor to guide or monitor treatment for MRSA infections. Test performance is not FDA approved in patients less than 12 years old. Performed at [facility], [address], [place], [place] [address]   Aerobic/Anaerobic Culture w Gram Stain (surgical/deep wound)     Status: None   Collection Time: 11/05/22 11:46 AM   Specimen: PATH Cytology Misc. fluid; Body Fluid  Result Value Ref Range Status   Specimen Description FLUID LEFT KNEE EFFUSION  Final   Special Requests A  Final   Gram Stain   Final    ABUNDANT WBC PRESENT, PREDOMINANTLY PMN NO ORGANISMS SEEN    Culture    Final    No growth aerobically or anaerobically. Performed at [facility], [address] [place], [place] [address]    Report Status 11/10/2022 FINAL  Final  Aerobic/Anaerobic Culture w Gram Stain (surgical/deep wound)     Status: None   Collection Time: 11/05/22 11:49 AM   Specimen: Thigh; Wound  Result Value Ref Range Status   Specimen Description THIGH LEFT LATERAL  Final   Special Requests B  Final   Gram Stain   Final    ABUNDANT WBC PRESENT, PREDOMINANTLY PMN NO ORGANISMS SEEN    Culture   Final    RARE CANDIDA PARAPSILOSIS NO ANAEROBES ISOLATED Performed at [facility], [address] [place] [address], [place], [place] [address]    Report Status 11/10/2022 FINAL  Final  Aerobic/Anaerobic Culture w Gram Stain (surgical/deep wound)     Status: None   Collection Time: 11/05/22 11:51 AM   Specimen: Thigh; Wound  Result Value Ref Range Status   Specimen Description TISSUE THIGH LEFT MEDIAL  Final   Special Requests TISSUE C  Final   Gram Stain   Final    ABUNDANT WBC PRESENT, PREDOMINANTLY PMN NO ORGANISMS SEEN    Culture   Final    RARE STENOTROPHOMONAS MALTOPHILIA NO ANAEROBES ISOLATED Performed at [facility], [address], [place], [place] [address]    Report Status 11/10/2022 FINAL  Final   Organism ID, Bacteria STENOTROPHOMONAS MALTOPHILIA  Final      Susceptibility   Stenotrophomonas maltophilia - MIC*    LEVOFLOXACIN 0.5 SENSITIVE Sensitive     TRIMETH/SULFA <=20 SENSITIVE Sensitive     * RARE STENOTROPHOMONAS MALTOPHILIA  Aerobic/Anaerobic Culture w Gram Stain (surgical/deep wound)     Status: None   Collection Time: 11/05/22 11:55 AM   Specimen: Thigh; Wound  Result Value Ref Range Status   Specimen Description THIGH RIGHT MEDIAL  Final   Special Requests DIALYSIS  Final   Gram Stain   Final    ABUNDANT WBC PRESENT, PREDOMINANTLY PMN NO ORGANISMS SEEN    Culture   Final    FEW KLEBSIELLA PNEUMONIAE Confirmed Extended Spectrum Beta-Lactamase  Producer (ESBL).  In bloodstream infections from ESBL organisms, carbapenems are preferred over piperacillin/tazobactam. They are shown to have a lower risk of mortality. FEW STENOTROPHOMONAS MALTOPHILIA NO ANAEROBES ISOLATED Performed at [facility], [address] [place], [place] [address]    Report Status 11/10/2022 FINAL  Final   Organism ID, Bacteria KLEBSIELLA PNEUMONIAE  Final   Organism ID, Bacteria STENOTROPHOMONAS MALTOPHILIA  Final      Susceptibility   Klebsiella pneumoniae - MIC*    AMPICILLIN >=32 RESISTANT Resistant     CEFEPIME >=32 RESISTANT Resistant     CEFTAZIDIME RESISTANT Resistant     CEFTRIAXONE >=64 RESISTANT Resistant     CIPROFLOXACIN >=4 RESISTANT Resistant     GENTAMICIN >=16 RESISTANT Resistant     IMIPENEM <=0.25 SENSITIVE Sensitive     TRIMETH/SULFA >=320 RESISTANT Resistant     AMPICILLIN/SULBACTAM >=32 RESISTANT Resistant     PIP/TAZO 64 INTERMEDIATE Intermediate     * FEW KLEBSIELLA PNEUMONIAE   Stenotrophomonas maltophilia - MIC*    LEVOFLOXACIN 0.5 SENSITIVE Sensitive     TRIMETH/SULFA <=20 SENSITIVE Sensitive     * FEW STENOTROPHOMONAS MALTOPHILIA  Aerobic/Anaerobic Culture w Gram Stain (surgical/deep wound)     Status: None   Collection Time: 11/05/22 11:56 AM   Specimen: Thigh; Wound  Result Value Ref Range Status   Specimen Description THIGH RIGHT LATERAL  Final   Special Requests THIGH  E  Final   Gram Stain   Final    ABUNDANT WBC PRESENT, PREDOMINANTLY PMN NO ORGANISMS SEEN    Culture   Final    No growth aerobically or anaerobically. Performed at [facility], [address], [place], [place] [address]    Report Status 11/10/2022 FINAL  Final  Culture, blood (Routine X 2) w Reflex to ID Panel     Status: None   Collection Time: 11/06/22  7:14 PM   Specimen: BLOOD  Result Value Ref Range Status   Specimen Description BLOOD SITE NOT SPECIFIED  Final   Special Requests   Final    BOTTLES DRAWN AEROBIC AND ANAEROBIC  Blood Culture adequate volume   Culture   Final    NO GROWTH 5 DAYS Performed at [facility], [address] [place], [place] [address]    Report Status 11/11/2022 FINAL  Final  Culture, blood (Routine X 2) w Reflex to ID Panel     Status: None   Collection Time: 11/06/22  7:14 PM   Specimen: BLOOD  Result Value Ref Range Status   Specimen Description BLOOD SITE NOT SPECIFIED  Final   Special Requests   Final    BOTTLES DRAWN AEROBIC AND ANAEROBIC Blood Culture adequate volume   Culture   Final    NO GROWTH 5 DAYS Performed at [facility], [address] [place], [place] [address]    Report Status 11/11/2022 FINAL  Final  Aerobic/Anaerobic Culture w Gram Stain (surgical/deep wound)     Status: None   Collection Time: 11/07/22 12:00 PM   Specimen: Wound; Tissue  Result Value Ref Range Status   Specimen Description WOUND  Final   Special Requests medial left leg  Final   Gram Stain   Final    FEW WBC PRESENT, PREDOMINANTLY PMN NO ORGANISMS SEEN    Culture   Final    RARE STENOTROPHOMONAS MALTOPHILIA SUSCEPTIBILITIES PERFORMED ON PREVIOUS CULTURE WITHIN THE LAST 5 DAYS. NO ANAEROBES ISOLATED    Report Status 11/12/2022 FINAL  Final  Aerobic/Anaerobic Culture w Gram Stain (surgical/deep wound)     Status: None   Collection Time: 11/07/22 12:18 PM   Specimen: Wound; Tissue  Result Value Ref Range Status   Specimen Description TISSUE  Final   Special Requests right BKA TISSUE  Final   Gram Stain   Final    FEW WBC PRESENT, PREDOMINANTLY PMN NO ORGANISMS SEEN    Culture   Final    RARE KLEBSIELLA PNEUMONIAE RARE STENOTROPHOMONAS MALTOPHILIA SUSCEPTIBILITIES PERFORMED ON PREVIOUS CULTURE WITHIN THE LAST 5 DAYS. NO ANAEROBES ISOLATED Performed at [facility], [address] [place], [place] [address]    Report Status 11/12/2022 FINAL  Final  Aerobic/Anaerobic Culture w Gram Stain (surgical/deep wound)     Status: None   Collection Time: 11/07/22  12:27 PM   Specimen: Wound; Tissue  Result Value Ref Range Status   Specimen Description TISSUE  Final   Special Requests lateral left thigh  Final   Gram Stain   Final    RARE WBC PRESENT, PREDOMINANTLY PMN NO ORGANISMS SEEN    Culture   Final    RARE KLEBSIELLA PNEUMONIAE RARE STENOTROPHOMONAS MALTOPHILIA NO ANAEROBES ISOLATED Performed at [facility], [address], [place], [place] [address]    Report Status 11/12/2022 FINAL  Final   Organism ID, Bacteria STENOTROPHOMONAS MALTOPHILIA  Final   Organism ID, Bacteria KLEBSIELLA PNEUMONIAE  Final      Susceptibility   Klebsiella pneumoniae - MIC*    AMPICILLIN >=32 RESISTANT Resistant     CEFEPIME <=0.12 SENSITIVE Sensitive     CEFTAZIDIME <=1 SENSITIVE Sensitive     CEFTRIAXONE <=0.25 SENSITIVE Sensitive     CIPROFLOXACIN >=4 RESISTANT Resistant     GENTAMICIN >=16 RESISTANT Resistant     IMIPENEM <=0.25 SENSITIVE Sensitive     TRIMETH/SULFA <=20 SENSITIVE Sensitive     AMPICILLIN/SULBACTAM >=32 RESISTANT Resistant     PIP/TAZO 32 INTERMEDIATE Intermediate     * RARE KLEBSIELLA PNEUMONIAE   Stenotrophomonas maltophilia - MIC*    LEVOFLOXACIN 0.5 SENSITIVE Sensitive     TRIMETH/SULFA <=20 SENSITIVE Sensitive     * RARE STENOTROPHOMONAS MALTOPHILIA  Aerobic/Anaerobic Culture w Gram Stain (surgical/deep wound)     Status: None   Collection Time: 11/07/22 12:27 PM   Specimen: Wound; Tissue  Result Value Ref Range Status   Specimen Description TISSUE  Final   Special Requests left BKA stamp  Final   Gram Stain   Final    FEW WBC PRESENT,BOTH PMN AND MONONUCLEAR NO ORGANISMS SEEN    Culture   Final    RARE KLEBSIELLA PNEUMONIAE SUSCEPTIBILITIES PERFORMED ON PREVIOUS CULTURE WITHIN THE LAST 5 DAYS. NO ANAEROBES ISOLATED Performed at [facility], [address], [place], [place] [address]    Report Status 11/12/2022 FINAL  Final  Aerobic/Anaerobic Culture w Gram Stain (surgical/deep wound)     Status: None  (Preliminary result)   Collection Time: 11/12/22  4:46 PM   Specimen: Soft Tissue, Other  Result Value Ref Range Status   Specimen Description TISSUE  Final   Special Requests LEFT THIGH WD  Final   Gram Stain   Final    FEW WBC PRESENT,BOTH PMN AND MONONUCLEAR NO ORGANISMS SEEN    Culture   Final    NO GROWTH < 12 HOURS Performed at [facility], [address], [place], [place] [address]    Report Status PENDING  Incomplete  Aerobic/Anaerobic Culture w Gram Stain (surgical/deep wound)     Status: None (Preliminary result)   Collection Time: 11/12/22  4:52 PM   Specimen: Soft Tissue, Other  Result Value Ref Range Status   Specimen Description TISSUE  Final   Special Requests RIGHT THIGH WD SPEC B  Final   Gram Stain   Final    FEW WBC PRESENT,BOTH PMN AND MONONUCLEAR NO ORGANISMS SEEN    Culture   Final    NO GROWTH < 12 HOURS Performed at [facility], [address] [place], [place] [address]    Report Status PENDING  Incomplete         Radiology Studies: No results found.         LOS: 41 days   Time spent= 35 mins

## 2022-11-14 ENCOUNTER — Encounter (HOSPITAL_COMMUNITY): Payer: Self-pay | Admitting: Orthopedic Surgery

## 2022-11-14 ENCOUNTER — Other Ambulatory Visit: Payer: Self-pay

## 2022-11-14 ENCOUNTER — Encounter (HOSPITAL_COMMUNITY)
Admission: EM | Disposition: A | Discharge status: Skilled Nursing Facility | Payer: Self-pay | Source: Home / Self Care | Attending: Internal Medicine

## 2022-11-14 ENCOUNTER — Inpatient Hospital Stay (HOSPITAL_COMMUNITY): Payer: Medicaid Other | Admitting: Certified Registered Nurse Anesthetist

## 2022-11-14 DIAGNOSIS — I96 Gangrene, not elsewhere classified: Secondary | ICD-10-CM

## 2022-11-14 DIAGNOSIS — E119 Type 2 diabetes mellitus without complications: Secondary | ICD-10-CM | POA: Diagnosis not present

## 2022-11-14 DIAGNOSIS — M726 Necrotizing fasciitis: Secondary | ICD-10-CM | POA: Diagnosis not present

## 2022-11-14 HISTORY — PX: AMPUTATION: SHX166

## 2022-11-14 LAB — CBC
HCT: 25.4 % — ABNORMAL LOW (ref 36.0–46.0)
Hemoglobin: 8.4 g/dL — ABNORMAL LOW (ref 12.0–15.0)
MCH: 30 pg (ref 26.0–34.0)
MCHC: 33.1 g/dL (ref 30.0–36.0)
MCV: 90.7 fL (ref 80.0–100.0)
Platelets: 560 10*3/uL — ABNORMAL HIGH (ref 150–400)
RBC: 2.8 MIL/uL — ABNORMAL LOW (ref 3.87–5.11)
RDW: 15.4 % (ref 11.5–15.5)
WBC: 29.4 10*3/uL — ABNORMAL HIGH (ref 4.0–10.5)
nRBC: 0 % (ref 0.0–0.2)

## 2022-11-14 LAB — BASIC METABOLIC PANEL
Anion gap: 6 (ref 5–15)
BUN: 30 mg/dL — ABNORMAL HIGH (ref 6–20)
CO2: 19 mmol/L — ABNORMAL LOW (ref 22–32)
Calcium: 7.4 mg/dL — ABNORMAL LOW (ref 8.9–10.3)
Chloride: 107 mmol/L (ref 98–111)
Creatinine, Ser: 1.37 mg/dL — ABNORMAL HIGH (ref 0.44–1.00)
GFR, Estimated: 53 mL/min — ABNORMAL LOW (ref >60)
Glucose, Bld: 96 mg/dL (ref 70–99)
Potassium: 5.5 mmol/L — ABNORMAL HIGH (ref 3.5–5.1)
Sodium: 132 mmol/L — ABNORMAL LOW (ref 135–145)

## 2022-11-14 LAB — GLUCOSE, CAPILLARY
Glucose-Capillary: 87 mg/dL (ref 70–99)
Glucose-Capillary: 98 mg/dL (ref 70–99)
Glucose-Capillary: 80 mg/dL (ref 70–99)
Glucose-Capillary: 134 mg/dL — ABNORMAL HIGH (ref 70–99)
Glucose-Capillary: 197 mg/dL — ABNORMAL HIGH (ref 70–99)

## 2022-11-14 LAB — PREPARE RBC (CROSSMATCH)

## 2022-11-14 SURGERY — AMPUTATION, ABOVE KNEE
Anesthesia: General | Site: Knee | Laterality: Bilateral

## 2022-11-14 MED ORDER — FENTANYL CITRATE (PF) 250 MCG/5ML IJ SOLN
INTRAMUSCULAR | Status: DC | PRN
  Administered 2022-11-14 (×5): 50 ug via INTRAVENOUS

## 2022-11-14 MED ORDER — ALUM & MAG HYDROXIDE-SIMETH 200-200-20 MG/5ML PO SUSP: 15.0000 mL | ORAL | Status: DC | PRN

## 2022-11-14 MED ORDER — PANTOPRAZOLE SODIUM 40 MG PO TBEC
40.0000 mg | DELAYED_RELEASE_TABLET | Freq: Every day | ORAL | Status: DC
  Administered 2022-11-14 – 2022-11-22 (×9): 40 mg via ORAL
  Filled 2022-11-14 (×9): qty 1

## 2022-11-14 MED ORDER — ACETAMINOPHEN 10 MG/ML IV SOLN
INTRAVENOUS | Status: AC
  Filled 2022-11-14: qty 100

## 2022-11-14 MED ORDER — LIDOCAINE 2% (20 MG/ML) 5 ML SYRINGE
INTRAMUSCULAR | Status: DC | PRN
  Administered 2022-11-14: 40 mg via INTRAVENOUS

## 2022-11-14 MED ORDER — SUCCINYLCHOLINE CHLORIDE 200 MG/10ML IV SOSY
PREFILLED_SYRINGE | INTRAVENOUS | Status: DC | PRN
  Administered 2022-11-14: 120 mg via INTRAVENOUS

## 2022-11-14 MED ORDER — JUVEN PO PACK
1.0000 | PACK | Freq: Two times a day (BID) | ORAL | Status: DC
  Administered 2022-11-14 – 2022-11-22 (×16): 1 via ORAL
  Filled 2022-11-14 (×15): qty 1

## 2022-11-14 MED ORDER — POLYETHYLENE GLYCOL 3350 17 G PO PACK: 17.0000 g | PACK | Freq: Every day | ORAL | Status: DC | PRN

## 2022-11-14 MED ORDER — ONDANSETRON HCL 4 MG/2ML IJ SOLN
INTRAMUSCULAR | Status: DC | PRN
  Administered 2022-11-14: 4 mg via INTRAVENOUS

## 2022-11-14 MED ORDER — VASHE WOUND IRRIGATION OPTIME
TOPICAL | Status: DC | PRN
Start: 2022-11-14 — End: 2022-11-14
  Administered 2022-11-14: 34 [oz_av]

## 2022-11-14 MED ORDER — ZINC SULFATE 220 (50 ZN) MG PO CAPS
220.0000 mg | ORAL_CAPSULE | Freq: Every day | ORAL | Status: DC
  Administered 2022-11-15 – 2022-11-22 (×8): 220 mg via ORAL
  Filled 2022-11-14 (×8): qty 1

## 2022-11-14 MED ORDER — SODIUM CHLORIDE 0.9 % IV SOLN: INTRAVENOUS | Status: DC | PRN

## 2022-11-14 MED ORDER — BISACODYL 5 MG PO TBEC: 5.0000 mg | DELAYED_RELEASE_TABLET | Freq: Every day | ORAL | Status: DC | PRN

## 2022-11-14 MED ORDER — MAGNESIUM CITRATE PO SOLN: 1.0000 | Freq: Once | ORAL | Status: DC | PRN

## 2022-11-14 MED ORDER — ACETAMINOPHEN 10 MG/ML IV SOLN
1000.0000 mg | Freq: Once | INTRAVENOUS | Status: DC | PRN
  Administered 2022-11-14: 1000 mg via INTRAVENOUS

## 2022-11-14 MED ORDER — GUAIFENESIN-DM 100-10 MG/5ML PO SYRP: 15.0000 mL | ORAL_SOLUTION | ORAL | Status: DC | PRN

## 2022-11-14 MED ORDER — POTASSIUM CHLORIDE CRYS ER 20 MEQ PO TBCR: 20.0000 meq | EXTENDED_RELEASE_TABLET | Freq: Every day | ORAL | Status: DC | PRN

## 2022-11-14 MED ORDER — FENTANYL CITRATE (PF) 250 MCG/5ML IJ SOLN
INTRAMUSCULAR | Status: AC
  Filled 2022-11-14: qty 5

## 2022-11-14 MED ORDER — FENTANYL CITRATE (PF) 100 MCG/2ML IJ SOLN
INTRAMUSCULAR | Status: AC
  Filled 2022-11-14: qty 2

## 2022-11-14 MED ORDER — FENTANYL CITRATE (PF) 100 MCG/2ML IJ SOLN
50.0000 ug | Freq: Once | INTRAMUSCULAR | Status: AC
  Administered 2022-11-14: 50 ug via INTRAVENOUS

## 2022-11-14 MED ORDER — GLUCERNA SHAKE PO LIQD
237.0000 mL | Freq: Three times a day (TID) | ORAL | Status: DC
  Administered 2022-11-14 – 2022-11-22 (×19): 237 mL via ORAL

## 2022-11-14 MED ORDER — HYDROMORPHONE HCL 1 MG/ML IJ SOLN
0.2500 mg | INTRAMUSCULAR | Status: DC | PRN
  Administered 2022-11-14 (×2): 0.5 mg via INTRAVENOUS

## 2022-11-14 MED ORDER — DOCUSATE SODIUM 100 MG PO CAPS
100.0000 mg | ORAL_CAPSULE | Freq: Every day | ORAL | Status: DC
  Administered 2022-11-20 – 2022-11-21 (×2): 100 mg via ORAL
  Filled 2022-11-14 (×3): qty 1

## 2022-11-14 MED ORDER — MIDAZOLAM HCL 2 MG/2ML IJ SOLN
INTRAMUSCULAR | Status: DC | PRN
  Administered 2022-11-14: 2 mg via INTRAVENOUS

## 2022-11-14 MED ORDER — CHLORHEXIDINE GLUCONATE 0.12 % MT SOLN
OROMUCOSAL | Status: AC
  Administered 2022-11-14: 15 mL via OROMUCOSAL
  Filled 2022-11-14: qty 15

## 2022-11-14 MED ORDER — MIDAZOLAM HCL 2 MG/2ML IJ SOLN
INTRAMUSCULAR | Status: AC
  Filled 2022-11-14: qty 2

## 2022-11-14 MED ORDER — CHLORHEXIDINE GLUCONATE 0.12 % MT SOLN: 15.0000 mL | Freq: Once | OROMUCOSAL | Status: AC

## 2022-11-14 MED ORDER — DROPERIDOL 2.5 MG/ML IJ SOLN: 0.6250 mg | Freq: Once | INTRAMUSCULAR | Status: DC | PRN

## 2022-11-14 MED ORDER — DEXAMETHASONE SODIUM PHOSPHATE 10 MG/ML IJ SOLN
INTRAMUSCULAR | Status: DC | PRN
  Administered 2022-11-14: 4 mg via INTRAVENOUS

## 2022-11-14 MED ORDER — HYDROMORPHONE HCL 1 MG/ML IJ SOLN
INTRAMUSCULAR | Status: AC
  Filled 2022-11-14: qty 1

## 2022-11-14 MED ORDER — LACTATED RINGERS IV SOLN: INTRAVENOUS | Status: DC

## 2022-11-14 MED ORDER — MEPERIDINE HCL 25 MG/ML IJ SOLN: 6.2500 mg | INTRAMUSCULAR | Status: DC | PRN

## 2022-11-14 MED ORDER — SODIUM CHLORIDE 0.9 % IV SOLN: INTRAVENOUS | Status: DC

## 2022-11-14 MED ORDER — MAGNESIUM SULFATE 2 GM/50ML IV SOLN: 2.0000 g | Freq: Every day | INTRAVENOUS | Status: DC | PRN

## 2022-11-14 MED ORDER — SCOPOLAMINE 1 MG/3DAYS TD PT72: 1.0000 | MEDICATED_PATCH | TRANSDERMAL | Status: DC

## 2022-11-14 MED ORDER — PROMETHAZINE HCL 25 MG/ML IJ SOLN: 6.2500 mg | INTRAMUSCULAR | Status: DC | PRN

## 2022-11-14 MED ORDER — HYDRALAZINE HCL 20 MG/ML IJ SOLN: 5.0000 mg | INTRAMUSCULAR | Status: DC | PRN

## 2022-11-14 MED ORDER — PROPOFOL 10 MG/ML IV BOLUS
INTRAVENOUS | Status: DC | PRN
Start: 2022-11-14 — End: 2022-11-14
  Administered 2022-11-14: 140 mg via INTRAVENOUS

## 2022-11-14 MED ORDER — VITAMIN C 500 MG PO TABS: 1000.0000 mg | ORAL_TABLET | Freq: Every day | ORAL | Status: DC

## 2022-11-14 MED ORDER — ACETAMINOPHEN 325 MG PO TABS: 325.0000 mg | ORAL_TABLET | Freq: Once | ORAL | Status: DC | PRN

## 2022-11-14 MED ORDER — ONDANSETRON HCL 4 MG/2ML IJ SOLN
4.0000 mg | Freq: Four times a day (QID) | INTRAMUSCULAR | Status: DC | PRN
  Administered 2022-11-15: 4 mg via INTRAVENOUS
  Filled 2022-11-14: qty 2

## 2022-11-14 MED ORDER — PHENOL 1.4 % MT LIQD: 1.0000 | OROMUCOSAL | Status: DC | PRN

## 2022-11-14 MED ORDER — LABETALOL HCL 5 MG/ML IV SOLN: 10.0000 mg | INTRAVENOUS | Status: DC | PRN

## 2022-11-14 MED ORDER — ROCURONIUM BROMIDE 10 MG/ML (PF) SYRINGE
PREFILLED_SYRINGE | INTRAVENOUS | Status: DC | PRN
  Administered 2022-11-14: 40 mg via INTRAVENOUS

## 2022-11-14 MED ORDER — SUGAMMADEX SODIUM 200 MG/2ML IV SOLN
INTRAVENOUS | Status: DC | PRN
  Administered 2022-11-14: 200 mg via INTRAVENOUS

## 2022-11-14 MED ORDER — ORAL CARE MOUTH RINSE: 15.0000 mL | Freq: Once | OROMUCOSAL | Status: AC

## 2022-11-14 MED ORDER — METOPROLOL TARTRATE 5 MG/5ML IV SOLN: 2.0000 mg | INTRAVENOUS | Status: DC | PRN

## 2022-11-14 MED ORDER — ACETAMINOPHEN 160 MG/5ML PO SOLN: 325.0000 mg | Freq: Once | ORAL | Status: DC | PRN

## 2022-11-14 MED ORDER — 0.9 % SODIUM CHLORIDE (POUR BTL) OPTIME
TOPICAL | Status: DC | PRN
Start: 2022-11-14 — End: 2022-11-14
  Administered 2022-11-14: 1000 mL

## 2022-11-14 MED ORDER — HYDROMORPHONE 1 MG/ML IV SOLN
INTRAVENOUS | Status: DC
  Administered 2022-11-15: 30 mg via INTRAVENOUS
  Administered 2022-11-15: 3.57 mg via INTRAVENOUS
  Administered 2022-11-15 (×2): 30 mg via INTRAVENOUS
  Administered 2022-11-16: 1.78 mg via INTRAVENOUS
  Administered 2022-11-16: 30 mg via INTRAVENOUS
  Administered 2022-11-16: 1.2 mg via INTRAVENOUS
  Administered 2022-11-16: 3.8 mg via INTRAVENOUS
  Administered 2022-11-17: 1.8 mg via INTRAVENOUS
  Administered 2022-11-17: 2 mg via INTRAVENOUS
  Administered 2022-11-17: 1.6 mg via INTRAVENOUS
  Filled 2022-11-14: qty 30

## 2022-11-14 MED ORDER — HYDROMORPHONE 1 MG/ML IV SOLN: INTRAVENOUS | Status: DC

## 2022-11-14 SURGICAL SUPPLY — 49 items
BAG COUNTER SPONGE SURGICOUNT (BAG) IMPLANT
BAG SPNG CNTER NS LX DISP (BAG)
BLADE SAW RECIP 87.9 MT (BLADE) ×1 IMPLANT
BLADE SURG 21 STRL SS (BLADE) ×1 IMPLANT
BNDG CMPR 5X6 CHSV STRCH STRL (GAUZE/BANDAGES/DRESSINGS)
BNDG COHESIVE 6X5 TAN NS LF (GAUZE/BANDAGES/DRESSINGS) IMPLANT
BNDG COHESIVE 6X5 TAN ST LF (GAUZE/BANDAGES/DRESSINGS) ×1 IMPLANT
BNDG GAUZE DERMACEA FLUFF 4 (GAUZE/BANDAGES/DRESSINGS) IMPLANT
BNDG GZE DERMACEA 4 6PLY (GAUZE/BANDAGES/DRESSINGS) ×2
CANISTER WOUND CARE 500ML ATS (WOUND CARE) IMPLANT
COVER SURGICAL LIGHT HANDLE (MISCELLANEOUS) ×1 IMPLANT
CUFF TOURN SGL QUICK 34 (TOURNIQUET CUFF)
CUFF TRNQT CYL 34X4.125X (TOURNIQUET CUFF) IMPLANT
DRAPE INCISE IOBAN 66X45 STRL (DRAPES) ×2 IMPLANT
DRAPE U-SHAPE 47X51 STRL (DRAPES) ×1 IMPLANT
DRESSING PREVENA PLUS CUSTOM (GAUZE/BANDAGES/DRESSINGS) ×1 IMPLANT
DRSG ADAPTIC 3X8 NADH LF (GAUZE/BANDAGES/DRESSINGS) IMPLANT
DRSG PREVENA PLUS CUSTOM (GAUZE/BANDAGES/DRESSINGS)
DURAPREP 26ML APPLICATOR (WOUND CARE) ×1 IMPLANT
ELECT REM PT RETURN 9FT ADLT (ELECTROSURGICAL) ×1
ELECTRODE REM PT RTRN 9FT ADLT (ELECTROSURGICAL) ×1 IMPLANT
GAUZE PAD ABD 8X10 STRL (GAUZE/BANDAGES/DRESSINGS) IMPLANT
GAUZE SPONGE 4X4 12PLY STRL (GAUZE/BANDAGES/DRESSINGS) IMPLANT
GLOVE BIOGEL PI IND STRL 7.5 (GLOVE) ×1 IMPLANT
GLOVE BIOGEL PI IND STRL 9 (GLOVE) ×1 IMPLANT
GLOVE SURG ORTHO 9.0 STRL STRW (GLOVE) ×1 IMPLANT
GLOVE SURG SS PI 6.5 STRL IVOR (GLOVE) ×1 IMPLANT
GOWN STRL REUS W/ TWL LRG LVL3 (GOWN DISPOSABLE) ×1 IMPLANT
GOWN STRL REUS W/ TWL XL LVL3 (GOWN DISPOSABLE) ×2 IMPLANT
GOWN STRL REUS W/TWL LRG LVL3 (GOWN DISPOSABLE) ×1
GOWN STRL REUS W/TWL XL LVL3 (GOWN DISPOSABLE) ×2
GRAFT SKIN WND SURGICLOSE M95 (Tissue) IMPLANT
KIT BASIN OR (CUSTOM PROCEDURE TRAY) ×1 IMPLANT
KIT TURNOVER KIT B (KITS) ×1 IMPLANT
MANIFOLD NEPTUNE II (INSTRUMENTS) ×1 IMPLANT
NS IRRIG 1000ML POUR BTL (IV SOLUTION) ×1 IMPLANT
PACK ORTHO EXTREMITY (CUSTOM PROCEDURE TRAY) ×1 IMPLANT
PAD ARMBOARD 7.5X6 YLW CONV (MISCELLANEOUS) ×1 IMPLANT
PREVENA RESTOR ARTHOFORM 46X30 (CANNISTER) ×1 IMPLANT
SPONGE T-LAP 18X18 ~~LOC~~+RFID (SPONGE) IMPLANT
STAPLER VISISTAT 35W (STAPLE) IMPLANT
STOCKINETTE IMPERVIOUS LG (DRAPES) IMPLANT
SUT ETHILON 2 0 PSLX (SUTURE) ×2 IMPLANT
SUT SILK 2 0 (SUTURE) ×2
SUT SILK 2-0 18XBRD TIE 12 (SUTURE) ×1 IMPLANT
SUT VIC AB 1 CTX 27 (SUTURE) IMPLANT
TOWEL GREEN STERILE FF (TOWEL DISPOSABLE) ×1 IMPLANT
TUBE CONNECTING 20X1/4 (TUBING) ×1 IMPLANT
YANKAUER SUCT BULB TIP NO VENT (SUCTIONS) ×1 IMPLANT

## 2022-11-14 NOTE — Anesthesia Procedure Notes (Signed)
Procedure Name: Intubation Date/Time: 11/14/2022 11:52 AM  Performed by: Garfield Cornea, CRNAPre-anesthesia Checklist: Patient identified, Emergency Drugs available, Suction available and Patient being monitored Patient Re-evaluated:Patient Re-evaluated prior to induction Oxygen Delivery Method: Circle system utilized Preoxygenation: Pre-oxygenation with 100% oxygen Induction Type: IV induction and Rapid sequence Ventilation: Mask ventilation without difficulty Laryngoscope Size: Mac and 3 Grade View: Grade I Tube type: Oral Tube size: 7.0 mm Number of attempts: 1 Airway Equipment and Method: Stylet Placement Confirmation: ETT inserted through vocal cords under direct vision, positive ETCO2 and breath sounds checked- equal and bilateral Secured at: 22 cm Tube secured with: Tape Dental Injury: Teeth and Oropharynx as per pre-operative assessment

## 2022-11-14 NOTE — Interval H&P Note (Signed)
History and Physical Interval Note:  11/14/2022 6:45 AM  Kelli Hudson  has presented today for surgery, with the diagnosis of Gangrene Bilateral Below Knee Amputation.  The various methods of treatment have been discussed with the patient and family. After consideration of risks, benefits and other options for treatment, the patient has consented to  Procedure(s): BILATERAL ABOVE KNEE AMPUTATION (Bilateral) as a surgical intervention.  The patient's history has been reviewed, patient examined, no change in status, stable for surgery.  I have reviewed the patient's chart and labs.  Questions were answered to the patient's satisfaction.     Nadara Mustard

## 2022-11-14 NOTE — Progress Notes (Signed)
PROGRESS NOTE    Kelli Hudson  XLK:[id number] DOB: [birth year] DOA: 10/03/2022 PCP: Norm Salt, PA    Brief Narrative:  32 y.o. female with medical history significant of DM1, diabetic gastroparesis with very frequent admission in the past, HTN presents to the hospital with bilateral foot wounds for about 2 weeks.  Not relieved with oral antibiotics.  She was admitted and treated with IV antibiotics but ultimately required extensive debridement and subsequent amputations for necrotizing fasciitis.   Chronology of events as below.  Since her surgery intermittently having anemia without any obvious signs of blood loss requiring PRBC transfusion, WBC still remains elevated but stable. Repeat CT showed persistent fluid collection, s/p OR 9/18, and again on 9/20 for repeat debridement.  Polymicrobial infection. 9/25, back to the OR for more debridement.  Nonviable tissue on below-knee amputation stumps and thigh wounds. 9/27, back to OR for revision.    Assessment & Plan:   Principal Problem:   Necrotizing fasciitis (HCC) Active Problems:   AKI (acute kidney injury) (HCC)   Sepsis due to cellulitis (HCC)   Cellulitis of lower extremity   Anemia   Diabetic gastroparesis associated with type 1 diabetes mellitus (HCC)   DM type 1 (diabetes mellitus, type 1) (HCC)   Drug-seeking behavior   Sepsis (HCC)   Necrotizing fasciitis of pelvic region and thigh (HCC)   Necrotizing fasciitis of lower leg (HCC)   MDD (major depressive disorder), recurrent episode, moderate (HCC)   Severe sepsis due to bilateral feet abscess, necrotizing fasciitis, POA.  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 -I&D 8/21 -Bilateral BKA on 9/6 -Back to OR and multiple debridements 9/18, 9/19, 9/25.  Amputation revisions. -Plan for above-knee amputation and further debridements today. Poor prognosis. 9/18, culture with steno and Klebsiella, MDR ESBL and rare  yeast 9/20, surgical culture with Klebsiella and steno.   On multiple antibiotics.  Currently on meropenem, Diflucan and Bactrim. Followed by ID. Severe painful condition.  Currently on Dilaudid PCA.   Hypotension with tachycardia Stable today. continuing Midodrine 5 mg 3 times daily.  Acute blood loss anemia due to operative blood loss Total 14 units of blood transfusion.  Hemoglobin 8.  2 units preparing for surgery today.   Type 1 diabetes, poorly controlled with intermittent hypoglycemia Blood sugars are controlled on long-acting insulin 4 units twice daily, sliding scale insulin.  Avoid hypoglycemia.     Acute kidney injury on CKD stage IIIa due to septic ATN, improving Non-anion gap metabolic acidosis, hyperkalemia. Hyponatremia, euvolemic Resolved now back to baseline creatinine .  Peaked at 2.54 Potassium elevated, monitor.  Remains stable.   Severe protein calorie malnutrition Encouraging oral diet, dietitian following.  Encourage oral diet Fortunately, she does not have gastroparesis symptoms these days and able to eat regular diet.  Occasionally using nausea medicine.   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  Currently not having active symptoms.  Occasionally using nausea medicine.   Hypertension IV as needed   Depression -On Cymbalta at home, continued    DVT prophylaxis: heparin   Code Status: Full Code  Family Communication: None today.  Status is: Inpatient Remains inpatient appropriate because: More inpatient procedures planned.  IV antibiotics.  Subjective:  Patient seen and examined in the morning rounds.  Anxious.  Patient tells me that she needs more pain medication, she is hurting and uncomfortable.   Examination:  General: Patient is anxious, flat affect. Appropriately in distress due to pain. Cardiovascular: S1-S2 normal.  Regular rate  rhythm. Respiratory: Bilateral clear.  No added sounds. Gastrointestinal: Soft.  Nontender.   Bowel sound present. Ext: Lower extremities on bulky bandages, multiple dressings.  Not removed by me. PICC line right arm. Neuro: Alert and awake.          Diet Orders (From admission, onward)     Start     Ordered   11/14/22 0430  Diet NPO time specified  Diet effective ____        11/13/22 1359            Objective: Vitals:   11/14/22 0805 11/14/22 0958 11/14/22 1021 11/14/22 1050  BP:   (!) 144/91   Pulse:   (!) 102   Resp: 11 16 17 15   Temp:   98.2 F (36.8 C)   TempSrc:   Oral   SpO2: 100% 100% 100%   Weight:      Height:        Intake/Output Summary (Last 24 hours) at 11/14/2022 1121 Last data filed at 11/14/2022 0400 Gross per 24 hour  Intake 2337.08 ml  Output --  Net 2337.08 ml   Filed Weights   11/10/22 0500 11/11/22 0331 11/12/22 0313  Weight: 101.8 kg 100 kg 100.1 kg    Scheduled Meds:  [MAR Hold] sodium chloride   Intravenous Once   [MAR Hold] acetaminophen  1,000 mg Oral Q6H   [MAR Hold] vitamin C  1,000 mg Oral Daily   [MAR Hold] Chlorhexidine Gluconate Cloth  6 each Topical Daily   [MAR Hold] dextrose  50 mL Intravenous Once   [MAR Hold] docusate sodium  100 mg Oral BID   [MAR Hold] docusate sodium  100 mg Oral Daily   [MAR Hold] DULoxetine  30 mg Oral Daily   feeding supplement (GLUCERNA SHAKE)  237 mL Oral TID BM   fentaNYL       [MAR Hold] fluconazole  400 mg Oral Daily   [MAR Hold] gabapentin  300 mg Oral Q8H   [MAR Hold] heparin injection (subcutaneous)  5,000 Units Subcutaneous Q8H   [MAR Hold] hydrocortisone cream   Topical TID   [MAR Hold] HYDROmorphone   Intravenous Q4H   [MAR Hold] insulin aspart  0-5 Units Subcutaneous QHS   [MAR Hold] insulin aspart  0-6 Units Subcutaneous TID WC   [MAR Hold] insulin glargine-yfgn  4 Units Subcutaneous BID   [MAR Hold] methocarbamol  1,000 mg Oral TID   [MAR Hold] metoCLOPramide  10 mg Oral Q8H   [MAR Hold] metoprolol tartrate  25 mg Oral BID   [MAR Hold] midodrine  5 mg Oral TID WC    [MAR Hold] minocycline  200 mg Oral BID   [MAR Hold] multivitamin with minerals  1 tablet Oral Daily   [MAR Hold] nutrition supplement (JUVEN)  1 packet Oral BID BM   [MAR Hold] omeprazole  40 mg Oral Daily   scopolamine  1 patch Transdermal Q72H   [MAR Hold] silver nitrate applicators  1 Application Topical Once   [MAR Hold] sodium chloride flush  10-40 mL Intracatheter Q12H   [MAR Hold] sulfamethoxazole-trimethoprim  2 tablet Oral Q12H   tranexamic acid (CYKLOKAPRON) 2,000 mg in sodium chloride 0.9 % 50 mL Topical Application  2,000 mg Topical To OR   [MAR Hold] zinc sulfate  220 mg Oral Daily   Continuous Infusions:  [MAR Hold] sodium chloride 10 mL/hr at 11/13/22 1745   sodium chloride 20 mL/hr at 11/13/22 1745    ceFAZolin (ANCEF) IV  lactated ringers 10 mL/hr at 11/14/22 1025   [MAR Hold] magnesium sulfate bolus IVPB     [MAR Hold] meropenem (MERREM) IV 1 g (11/14/22 0550)   [MAR Hold] methocarbamol (ROBAXIN) IV Stopped (11/13/22 0617)   tranexamic acid      Nutritional status Signs/Symptoms: estimated needs Interventions: Juven, Glucerna shake Body mass index is 34.56 kg/m.  Data Reviewed:   CBC: Recent Labs  Lab 11/09/22 0030 11/10/22 0350 11/11/22 0340 11/13/22 0600 11/13/22 2115 11/14/22 0500  WBC 36.4* 35.1* 33.5* 31.2*  --  29.4*  HGB 8.0* 8.8* 8.7* 6.2* 8.5* 8.4*  HCT 24.6* 27.6* 27.5* 19.4* 25.8* 25.4*  MCV 93.2 95.2 93.2 94.6  --  90.7  PLT 446* 565* 622* 544*  --  560*   Basic Metabolic Panel: Recent Labs  Lab 11/07/22 1900 11/08/22 0713 11/10/22 0350 11/11/22 0340 11/12/22 0309 11/13/22 0600 11/14/22 0712  NA  --    < > 131* 126* 129* 130* 132*  K  --    < > 5.1 5.1 5.5* 5.7* 5.5*  CL  --    < > 105 101 105 108 107  CO2  --    < > 19* 20* 20* 18* 19*  GLUCOSE  --    < > 229* 162* 100* 195* 96  BUN  --    < > 35* 36* 35* 32* 30*  CREATININE  --    < > 1.15* 1.07* 1.19* 1.60* 1.37*  CALCIUM  --    < > 7.4* 7.3* 7.7* 6.7* 7.4*  MG 1.5*   --  1.8  --   --   --   --    < > = values in this interval not displayed.   GFR: Estimated Creatinine Clearance: 72.3 mL/min (A) (by C-G formula based on SCr of 1.37 mg/dL (H)). Liver Function Tests: No results for input(s): "AST", "ALT", "ALKPHOS", "BILITOT", "PROT", "ALBUMIN" in the last 168 hours. No results for input(s): "LIPASE", "AMYLASE" in the last 168 hours. No results for input(s): "AMMONIA" in the last 168 hours. Coagulation Profile: No results for input(s): "INR", "PROTIME" in the last 168 hours. Cardiac Enzymes: No results for input(s): "CKTOTAL", "CKMB", "CKMBINDEX", "TROPONINI" in the last 168 hours. BNP (last 3 results) No results for input(s): "PROBNP" in the last 8760 hours. HbA1C: No results for input(s): "HGBA1C" in the last 72 hours. CBG: Recent Labs  Lab 11/13/22 1202 11/13/22 1618 11/13/22 2108 11/14/22 0640 11/14/22 1024  GLUCAP 170* 167* 119* 87 98   Lipid Profile: No results for input(s): "CHOL", "HDL", "LDLCALC", "TRIG", "CHOLHDL", "LDLDIRECT" in the last 72 hours. Thyroid Function Tests: No results for input(s): "TSH", "T4TOTAL", "FREET4", "T3FREE", "THYROIDAB" in the last 72 hours. Anemia Panel: No results for input(s): "VITAMINB12", "FOLATE", "FERRITIN", "TIBC", "IRON", "RETICCTPCT" in the last 72 hours. Sepsis Labs: No results for input(s): "PROCALCITON", "LATICACIDVEN" in the last 168 hours.  Recent Results (from the past 240 hour(s))  MRSA Next Gen by PCR, Nasal     Status: None   Collection Time: 11/05/22  6:22 AM   Specimen: Nasal Mucosa; Nasal Swab  Result Value Ref Range Status   MRSA by PCR Next Gen NOT DETECTED NOT DETECTED Final    Comment: (NOTE) The GeneXpert MRSA Assay (FDA approved for NASAL specimens only), is one component of a comprehensive MRSA colonization surveillance program. It is not intended to diagnose MRSA infection nor to guide or monitor treatment for MRSA infections. Test performance is not FDA approved in  patients  less than 3 years old. Performed at [facility], [address], [place], [place] [address]   Aerobic/Anaerobic Culture w Gram Stain (surgical/deep wound)     Status: None   Collection Time: 11/05/22 11:46 AM   Specimen: PATH Cytology Misc. fluid; Body Fluid  Result Value Ref Range Status   Specimen Description FLUID LEFT KNEE EFFUSION  Final   Special Requests A  Final   Gram Stain   Final    ABUNDANT WBC PRESENT, PREDOMINANTLY PMN NO ORGANISMS SEEN    Culture   Final    No growth aerobically or anaerobically. Performed at [facility], [address] [place], [place] [address]    Report Status 11/10/2022 FINAL  Final  Aerobic/Anaerobic Culture w Gram Stain (surgical/deep wound)     Status: None   Collection Time: 11/05/22 11:49 AM   Specimen: Thigh; Wound  Result Value Ref Range Status   Specimen Description THIGH LEFT LATERAL  Final   Special Requests B  Final   Gram Stain   Final    ABUNDANT WBC PRESENT, PREDOMINANTLY PMN NO ORGANISMS SEEN    Culture   Final    RARE CANDIDA PARAPSILOSIS NO ANAEROBES ISOLATED Performed at [facility], [address], [place], [place] [address]    Report Status 11/10/2022 FINAL  Final  Aerobic/Anaerobic Culture w Gram Stain (surgical/deep wound)     Status: None   Collection Time: 11/05/22 11:51 AM   Specimen: Thigh; Wound  Result Value Ref Range Status   Specimen Description TISSUE THIGH LEFT MEDIAL  Final   Special Requests TISSUE C  Final   Gram Stain   Final    ABUNDANT WBC PRESENT, PREDOMINANTLY PMN NO ORGANISMS SEEN    Culture   Final    RARE STENOTROPHOMONAS MALTOPHILIA NO ANAEROBES ISOLATED Performed at [facility], [address], [place], [place] [address]    Report Status 11/10/2022 FINAL  Final   Organism ID, Bacteria STENOTROPHOMONAS MALTOPHILIA  Final      Susceptibility   Stenotrophomonas maltophilia - MIC*    LEVOFLOXACIN 0.5 SENSITIVE Sensitive     TRIMETH/SULFA <=20  SENSITIVE Sensitive     * RARE STENOTROPHOMONAS MALTOPHILIA  Aerobic/Anaerobic Culture w Gram Stain (surgical/deep wound)     Status: None   Collection Time: 11/05/22 11:55 AM   Specimen: Thigh; Wound  Result Value Ref Range Status   Specimen Description THIGH RIGHT MEDIAL  Final   Special Requests DIALYSIS  Final   Gram Stain   Final    ABUNDANT WBC PRESENT, PREDOMINANTLY PMN NO ORGANISMS SEEN    Culture   Final    FEW KLEBSIELLA PNEUMONIAE Confirmed Extended Spectrum Beta-Lactamase Producer (ESBL).  In bloodstream infections from ESBL organisms, carbapenems are preferred over piperacillin/tazobactam. They are shown to have a lower risk of mortality. FEW STENOTROPHOMONAS MALTOPHILIA NO ANAEROBES ISOLATED Performed at [facility], [address] [place], [place] [address]    Report Status 11/10/2022 FINAL  Final   Organism ID, Bacteria KLEBSIELLA PNEUMONIAE  Final   Organism ID, Bacteria STENOTROPHOMONAS MALTOPHILIA  Final      Susceptibility   Klebsiella pneumoniae - MIC*    AMPICILLIN >=32 RESISTANT Resistant     CEFEPIME >=32 RESISTANT Resistant     CEFTAZIDIME RESISTANT Resistant     CEFTRIAXONE >=64 RESISTANT Resistant     CIPROFLOXACIN >=4 RESISTANT Resistant     GENTAMICIN >=16 RESISTANT Resistant     IMIPENEM <=0.25 SENSITIVE  Sensitive     TRIMETH/SULFA >=320 RESISTANT Resistant     AMPICILLIN/SULBACTAM >=32 RESISTANT Resistant     PIP/TAZO 64 INTERMEDIATE Intermediate     * FEW KLEBSIELLA PNEUMONIAE   Stenotrophomonas maltophilia - MIC*    LEVOFLOXACIN 0.5 SENSITIVE Sensitive     TRIMETH/SULFA <=20 SENSITIVE Sensitive     * FEW STENOTROPHOMONAS MALTOPHILIA  Aerobic/Anaerobic Culture w Gram Stain (surgical/deep wound)     Status: None   Collection Time: 11/05/22 11:56 AM   Specimen: Thigh; Wound  Result Value Ref Range Status   Specimen Description THIGH RIGHT LATERAL  Final   Special Requests THIGH E  Final   Gram Stain   Final    ABUNDANT WBC PRESENT,  PREDOMINANTLY PMN NO ORGANISMS SEEN    Culture   Final    No growth aerobically or anaerobically. Performed at [facility], [address], [place], [place] [address]    Report Status 11/10/2022 FINAL  Final  Culture, blood (Routine X 2) w Reflex to ID Panel     Status: None   Collection Time: 11/06/22  7:14 PM   Specimen: BLOOD  Result Value Ref Range Status   Specimen Description BLOOD SITE NOT SPECIFIED  Final   Special Requests   Final    BOTTLES DRAWN AEROBIC AND ANAEROBIC Blood Culture adequate volume   Culture   Final    NO GROWTH 5 DAYS Performed at [facility], [address], [place], [place] [address]    Report Status 11/11/2022 FINAL  Final  Culture, blood (Routine X 2) w Reflex to ID Panel     Status: None   Collection Time: 11/06/22  7:14 PM   Specimen: BLOOD  Result Value Ref Range Status   Specimen Description BLOOD SITE NOT SPECIFIED  Final   Special Requests   Final    BOTTLES DRAWN AEROBIC AND ANAEROBIC Blood Culture adequate volume   Culture   Final    NO GROWTH 5 DAYS Performed at [facility], [address], [place], [place] [address]    Report Status 11/11/2022 FINAL  Final  Aerobic/Anaerobic Culture w Gram Stain (surgical/deep wound)     Status: None   Collection Time: 11/07/22 12:00 PM   Specimen: Wound; Tissue  Result Value Ref Range Status   Specimen Description WOUND  Final   Special Requests medial left leg  Final   Gram Stain   Final    FEW WBC PRESENT, PREDOMINANTLY PMN NO ORGANISMS SEEN    Culture   Final    RARE STENOTROPHOMONAS MALTOPHILIA SUSCEPTIBILITIES PERFORMED ON PREVIOUS CULTURE WITHIN THE LAST 5 DAYS. NO ANAEROBES ISOLATED    Report Status 11/12/2022 FINAL  Final  Aerobic/Anaerobic Culture w Gram Stain (surgical/deep wound)     Status: None   Collection Time: 11/07/22 12:18 PM   Specimen: Wound; Tissue  Result Value Ref Range Status   Specimen Description TISSUE  Final   Special Requests right BKA  TISSUE  Final   Gram Stain   Final    FEW WBC PRESENT, PREDOMINANTLY PMN NO ORGANISMS SEEN    Culture   Final    RARE KLEBSIELLA PNEUMONIAE RARE STENOTROPHOMONAS MALTOPHILIA SUSCEPTIBILITIES PERFORMED ON PREVIOUS CULTURE WITHIN THE LAST 5 DAYS. NO ANAEROBES ISOLATED Performed at [facility], [address] [place], [place] [address]    Report Status 11/12/2022 FINAL  Final  Aerobic/Anaerobic Culture w Gram Stain (surgical/deep wound)     Status: None  Collection Time: 11/07/22 12:27 PM   Specimen: Wound; Tissue  Result Value Ref Range Status   Specimen Description TISSUE  Final   Special Requests lateral left thigh  Final   Gram Stain   Final    RARE WBC PRESENT, PREDOMINANTLY PMN NO ORGANISMS SEEN    Culture   Final    RARE KLEBSIELLA PNEUMONIAE RARE STENOTROPHOMONAS MALTOPHILIA NO ANAEROBES ISOLATED Performed at [facility], [address], [place], [place] [address]    Report Status 11/12/2022 FINAL  Final   Organism ID, Bacteria STENOTROPHOMONAS MALTOPHILIA  Final   Organism ID, Bacteria KLEBSIELLA PNEUMONIAE  Final      Susceptibility   Klebsiella pneumoniae - MIC*    AMPICILLIN >=32 RESISTANT Resistant     CEFEPIME <=0.12 SENSITIVE Sensitive     CEFTAZIDIME <=1 SENSITIVE Sensitive     CEFTRIAXONE <=0.25 SENSITIVE Sensitive     CIPROFLOXACIN >=4 RESISTANT Resistant     GENTAMICIN >=16 RESISTANT Resistant     IMIPENEM <=0.25 SENSITIVE Sensitive     TRIMETH/SULFA <=20 SENSITIVE Sensitive     AMPICILLIN/SULBACTAM >=32 RESISTANT Resistant     PIP/TAZO 32 INTERMEDIATE Intermediate     * RARE KLEBSIELLA PNEUMONIAE   Stenotrophomonas maltophilia - MIC*    LEVOFLOXACIN 0.5 SENSITIVE Sensitive     TRIMETH/SULFA <=20 SENSITIVE Sensitive     * RARE STENOTROPHOMONAS MALTOPHILIA  Aerobic/Anaerobic Culture w Gram Stain (surgical/deep wound)     Status: None   Collection Time: 11/07/22 12:27 PM   Specimen: Wound; Tissue  Result Value Ref Range Status    Specimen Description TISSUE  Final   Special Requests left BKA stamp  Final   Gram Stain   Final    FEW WBC PRESENT,BOTH PMN AND MONONUCLEAR NO ORGANISMS SEEN    Culture   Final    RARE KLEBSIELLA PNEUMONIAE SUSCEPTIBILITIES PERFORMED ON PREVIOUS CULTURE WITHIN THE LAST 5 DAYS. NO ANAEROBES ISOLATED Performed at [facility], [address], [place], [place] [address]    Report Status 11/12/2022 FINAL  Final  Aerobic/Anaerobic Culture w Gram Stain (surgical/deep wound)     Status: None (Preliminary result)   Collection Time: 11/12/22  4:46 PM   Specimen: Soft Tissue, Other  Result Value Ref Range Status   Specimen Description TISSUE  Final   Special Requests LEFT THIGH WD  Final   Gram Stain   Final    FEW WBC PRESENT,BOTH PMN AND MONONUCLEAR NO ORGANISMS SEEN    Culture   Final    NO GROWTH 2 DAYS Performed at [facility], [address] [place], [place] [address]    Report Status PENDING  Incomplete  Aerobic/Anaerobic Culture w Gram Stain (surgical/deep wound)     Status: None (Preliminary result)   Collection Time: 11/12/22  4:52 PM   Specimen: Soft Tissue, Other  Result Value Ref Range Status   Specimen Description TISSUE  Final   Special Requests RIGHT THIGH WD SPEC B  Final   Gram Stain   Final    FEW WBC PRESENT,BOTH PMN AND MONONUCLEAR NO ORGANISMS SEEN    Culture   Final    NO GROWTH 2 DAYS Performed at [facility], [address] [place], [place] [address]    Report Status PENDING  Incomplete         Radiology Studies: No results found.         LOS: 42 days   Time spent= 35 mins

## 2022-11-14 NOTE — Anesthesia Postprocedure Evaluation (Signed)
Anesthesia Post Note  Patient: Network engineer  Procedure(s) Performed: BILATERAL ABOVE KNEE AMPUTATION (Bilateral: Knee)     Patient location during evaluation: PACU Anesthesia Type: General Level of consciousness: awake and alert Pain management: pain level controlled Vital Signs Assessment: post-procedure vital signs reviewed and stable Respiratory status: spontaneous breathing, nonlabored ventilation, respiratory function stable and patient connected to nasal cannula oxygen Cardiovascular status: blood pressure returned to baseline and stable Postop Assessment: no apparent nausea or vomiting Anesthetic complications: no  No notable events documented.  Last Vitals:  Vitals:   11/14/22 1326 11/14/22 1330  BP: 108/73 121/77  Pulse: 94 93  Resp: (!) 8 11  Temp:    SpO2: 94% 97%    Last Pain:  Vitals:   11/14/22 1319  TempSrc:   PainSc: 6                  Shelton Silvas

## 2022-11-14 NOTE — Anesthesia Preprocedure Evaluation (Signed)
Anesthesia Evaluation  Patient identified by MRN, date of birth, ID band Patient awake    Reviewed: Allergy & Precautions, NPO status , Patient's Chart, lab work & pertinent test results  Airway Mallampati: II  TM Distance: >3 FB Neck ROM: Full    Dental  (+) Teeth Intact, Dental Advisory Given   Pulmonary neg pulmonary ROS   breath sounds clear to auscultation       Cardiovascular hypertension, Pt. on medications and Pt. on home beta blockers  Rhythm:Regular Rate:Normal     Neuro/Psych  PSYCHIATRIC DISORDERS Anxiety Depression     Neuromuscular disease    GI/Hepatic Neg liver ROS, PUD,GERD  Medicated,,  Endo/Other  diabetes, Type 1, Insulin Dependent Hyperthyroidism   Renal/GU Renal disease     Musculoskeletal negative musculoskeletal ROS (+)    Abdominal   Peds  Hematology  (+) Blood dyscrasia, anemia   Anesthesia Other Findings   Reproductive/Obstetrics                             Anesthesia Physical Anesthesia Plan  ASA: 3  Anesthesia Plan: General   Post-op Pain Management: Toradol IV (intra-op)* and Dilaudid IV   Induction: Intravenous  PONV Risk Score and Plan: 4 or greater and Ondansetron, Dexamethasone, Midazolam and Scopolamine patch - Pre-op  Airway Management Planned: Oral ETT  Additional Equipment: None  Intra-op Plan:   Post-operative Plan:   Informed Consent: I have reviewed the patients History and Physical, chart, labs and discussed the procedure including the risks, benefits and alternatives for the proposed anesthesia with the patient or authorized representative who has indicated his/her understanding and acceptance.     Dental advisory given  Plan Discussed with: CRNA  Anesthesia Plan Comments:        Anesthesia Quick Evaluation

## 2022-11-14 NOTE — Transfer of Care (Signed)
Immediate Anesthesia Transfer of Care Note  Patient: Kelli Hudson  Procedure(s) Performed: BILATERAL ABOVE KNEE AMPUTATION (Bilateral: Knee)  Patient Location: PACU  Anesthesia Type:General  Level of Consciousness: drowsy and patient cooperative  Airway & Oxygen Therapy: Patient Spontanous Breathing  Post-op Assessment: Report given to RN, Post -op Vital signs reviewed and stable, and Patient moving all extremities X 4  Post vital signs: Reviewed and stable  Last Vitals: Within normal limits. See PACU flowsheet   Last Pain:  Vitals:   11/14/22 1021  TempSrc: Oral  PainSc: 10-Worst pain ever      Patients Stated Pain Goal: 4 (11/13/22 0817)  Complications: No notable events documented.

## 2022-11-14 NOTE — Progress Notes (Signed)
Nutrition Follow-up  DOCUMENTATION CODES:   Not applicable  INTERVENTION:  Continue double protein portions with meals Magic cup TID with meals, each supplement provides 290 kcal and 9 grams of protein Juven BID to support wound healing  NUTRITION DIAGNOSIS:   Increased nutrient needs related to wound healing as evidenced by estimated needs. - ongoing  GOAL:   Patient will meet greater than or equal to 90% of their needs - remains applicable  MONITOR:   PO intake, Supplement acceptance  REASON FOR ASSESSMENT:   Consult Enteral/tube feeding initiation and management  ASSESSMENT:   32 y.o. female admits related to leg swelling. PMH includes: acute H. Pylori gastric ulcer, DM, gastroparesis, DKA, GERD, HTN, hyperthyroidism, normocytic anemia. Pt is currently receiving medical management related to sepsis due to cellulitis.  9/6- s/p bilateral BKA, applications of wound vac x 2, application of stump shrinker, aspiration of lt knee; advanced to full liquid diet 9/8- advanced to soft diet  9/9- cortrak d/c 9/14- per ID, CT scans of bilateral legs reveal hematoma  9/20 - debridement bilateral BKA 9/25 - repeat debridement bilateral BKA 9/27 - bilateral AKA  Pt off unit for surgical procedure.   There is limited documentation of meal completions on file over the last week, however of the documented meals, po intake appears limited. Will continue with current nutrition interventions. RD to add glucerna shakes in addition to current nutrition interventions to promote adequate po intake to support wound healing.   9/23: 50% breakfast, 40% lunch 9/24: 50% breakfast, 40% lunch  Medications: Vitamin C 1000mg  daily, colace, SSI 0-5 units at bedtime, SSI 0-6 units TID, semglee 4 units BID, reglan, MVI, zinc   Labs (9/26): sodium 130, potassium 5.7, BUN 32, Cr 1.60, Ca 6.7, anion gap 4, GFR 44, CBG's  87-170 x24 hours  Diet Order:   Diet Order             Diet NPO time  specified  Diet effective ____                   EDUCATION NEEDS:   Not appropriate for education at this time  Skin:  Skin Assessment: Skin Integrity Issues: Skin Integrity Issues:: Stage II, Incisions, Wound VAC Stage II: rt elbow Wound Vac: closed rt and lt legs s/p bilateral BKA Incisions: closed rt and lt legs s/p bilateral BKA Other: -  Last BM:  9/26 (x2- type 7, type 6)  Height:   Ht Readings from Last 1 Encounters:  11/05/22 5\' 7"  (1.702 m)    Weight:   Wt Readings from Last 1 Encounters:  11/12/22 100.1 kg    Ideal Body Weight:  53.8 kg (adjusted for bilateral BKAs)  BMI:  Body mass index is 34.56 kg/m.  Estimated Nutritional Needs:   Kcal:  1950-2150  Protein:  120-135 grams  Fluid:  > 2 L  Drusilla Kanner, RDN, LDN Clinical Nutrition

## 2022-11-14 NOTE — Progress Notes (Signed)
PT Cancellation Note  Patient Details Name: Angeline Trick MRN: [id number] DOB: [birth year]   Cancelled Treatment:    Reason Eval/Treat Not Completed: Patient not medically ready.  Pt scheduled for bil revision to AKA's.  Will Hold today and reassess as able 9/28.   11/14/2022  Jacinto Halim., PT Acute Rehabilitation Services [phone]  (office)   Eliseo Gum Dalinda Heidt 11/14/2022, 9:58 AM

## 2022-11-14 NOTE — Op Note (Signed)
11/14/2022  1:17 PM  PATIENT:  Kelli Hudson    PRE-OPERATIVE DIAGNOSIS:  Gangrene Bilateral Below Knee Amputation  POST-OPERATIVE DIAGNOSIS:  Same  PROCEDURE:  BILATERAL ABOVE KNEE AMPUTATION Application Kerecis micro graft 95 cm x 2.  SURGEON:  Nadara Mustard, MD  PHYSICIAN ASSISTANT:None ANESTHESIA:   General  PREOPERATIVE INDICATIONS:  Kelli Hudson is a  32 y.o. female with a diagnosis of Gangrene Bilateral Below Knee Amputation who failed conservative measures and elected for surgical management.    The risks benefits and alternatives were discussed with the patient preoperatively including but not limited to the risks of infection, bleeding, nerve injury, cardiopulmonary complications, the need for revision surgery, among others, and the patient was willing to proceed.  OPERATIVE IMPLANTS:   Implant Name Type Inv. Item Serial No. Manufacturer Lot No. LRB No. Used Action  GRAFT SKIN WND SURGICLOSE M95 - [id number] Tissue GRAFT SKIN WND SURGICLOSE M95  [employer] [id number] Bilateral 1 Implanted  GRAFT SKIN WND SURGICLOSE M95 - [id number] Tissue GRAFT SKIN WND SURGICLOSE M95  [employer] [id number] Bilateral 1 Implanted    @ENCIMAGES @  OPERATIVE FINDINGS: Tissue margins were clear.  Muscle had good color contractility and consistency.  OPERATIVE PROCEDURE: Patient was brought the operating room and underwent a general anesthetic.  After adequate levels anesthesia were obtained both lower extremities were prepped using DuraPrep draped into a sterile field.  The below-knee amputations were draped out of the sterile field with Ioban dressing.  A timeout was called.  A fishmouth incision was made on the right thigh proximal to the patella.  This was carried down to the femur extra-articular.  The vascular bundle medially was clamped and suture-ligated with 2-0 silk x 2.  The remainder of the fishmouth incision was completed the femur was transected with a  reciprocating saw.  Electrocautery was used for further hemostasis.  The wound was irrigated with Vashe.  The wound was then filled with Kerecis micro graft 95 cm subcu was closed using #1 Vicryl.  The skin was closed using staples.  Attention was then focused on the left lower extremity.  A fishmouth incision was made just proximal to the patella this was carried down extra-articular.  The reciprocating saw was used to amputate through the femur.  The vascular bundle medially was suture-ligated with 2-0 silk x 2.  A fishmouth incision was completed amputation completed electrocautery was used for further hemostasis.  The wound was irrigated with Vashe and 95 cm of Kerecis micro graft was applied.  A sterile dry dressing was applied to both lower extremities.  Patient was extubated taken the PACU in stable condition.   Patient received 1 unit of packed red blood cells interoperatively.

## 2022-11-14 NOTE — Progress Notes (Signed)
OT Cancellation Note  Patient Details Name: Kelli Hudson MRN: [id number] DOB: [birth year]   Cancelled Treatment:    Reason Eval/Treat Not Completed: Patient at procedure or test/ unavailable (Pt scheduled for B AKA revision today. OT to hold treatment today with plan to reassess as able on 9/28.)  Danecia Underdown "Ronaldo Miyamoto" M., OTR/L, MA Acute Rehab [phone]   Lendon Colonel 11/14/2022, 11:04 AM

## 2022-11-15 ENCOUNTER — Encounter (HOSPITAL_COMMUNITY): Payer: Self-pay | Admitting: Orthopedic Surgery

## 2022-11-15 DIAGNOSIS — M726 Necrotizing fasciitis: Secondary | ICD-10-CM | POA: Diagnosis not present

## 2022-11-15 LAB — GLUCOSE, CAPILLARY
Glucose-Capillary: 167 mg/dL — ABNORMAL HIGH (ref 70–99)
Glucose-Capillary: 188 mg/dL — ABNORMAL HIGH (ref 70–99)
Glucose-Capillary: 259 mg/dL — ABNORMAL HIGH (ref 70–99)
Glucose-Capillary: 311 mg/dL — ABNORMAL HIGH (ref 70–99)
Glucose-Capillary: 326 mg/dL — ABNORMAL HIGH (ref 70–99)
Glucose-Capillary: 343 mg/dL — ABNORMAL HIGH (ref 70–99)

## 2022-11-15 LAB — BASIC METABOLIC PANEL
Anion gap: 6 (ref 5–15)
BUN: 40 mg/dL — ABNORMAL HIGH (ref 6–20)
CO2: 16 mmol/L — ABNORMAL LOW (ref 22–32)
Calcium: 7.2 mg/dL — ABNORMAL LOW (ref 8.9–10.3)
Chloride: 105 mmol/L (ref 98–111)
Creatinine, Ser: 1.97 mg/dL — ABNORMAL HIGH (ref 0.44–1.00)
GFR, Estimated: 34 mL/min — ABNORMAL LOW (ref >60)
Glucose, Bld: 381 mg/dL — ABNORMAL HIGH (ref 70–99)
Potassium: 6.4 mmol/L (ref 3.5–5.1)
Sodium: 127 mmol/L — ABNORMAL LOW (ref 135–145)

## 2022-11-15 LAB — CBC
HCT: 20.2 % — ABNORMAL LOW (ref 36.0–46.0)
Hemoglobin: 6.6 g/dL — CL (ref 12.0–15.0)
MCH: 30.7 pg (ref 26.0–34.0)
MCHC: 32.7 g/dL (ref 30.0–36.0)
MCV: 94 fL (ref 80.0–100.0)
Platelets: 507 10*3/uL — ABNORMAL HIGH (ref 150–400)
RBC: 2.15 MIL/uL — ABNORMAL LOW (ref 3.87–5.11)
RDW: 15.4 % (ref 11.5–15.5)
WBC: 33.8 10*3/uL — ABNORMAL HIGH (ref 4.0–10.5)
nRBC: 0 % (ref 0.0–0.2)

## 2022-11-15 LAB — PREPARE RBC (CROSSMATCH)

## 2022-11-15 LAB — POTASSIUM: Potassium: 6.3 mmol/L (ref 3.5–5.1)

## 2022-11-15 LAB — HEMOGLOBIN AND HEMATOCRIT, BLOOD
HCT: 19.7 % — ABNORMAL LOW (ref 36.0–46.0)
Hemoglobin: 6.7 g/dL — CL (ref 12.0–15.0)

## 2022-11-15 MED ORDER — INSULIN ASPART 100 UNIT/ML IJ SOLN
5.0000 [IU] | Freq: Once | INTRAMUSCULAR | Status: AC
  Administered 2022-11-15: 5 [IU] via INTRAVENOUS

## 2022-11-15 MED ORDER — DEXTROSE 50 % IV SOLN
25.0000 mL | Freq: Once | INTRAVENOUS | Status: AC
  Administered 2022-11-16: 25 mL via INTRAVENOUS
  Filled 2022-11-15: qty 50

## 2022-11-15 MED ORDER — SODIUM BICARBONATE 8.4 % IV SOLN
25.0000 meq | Freq: Once | INTRAVENOUS | Status: AC
  Administered 2022-11-16: 25 meq via INTRAVENOUS
  Filled 2022-11-15: qty 50

## 2022-11-15 MED ORDER — SODIUM ZIRCONIUM CYCLOSILICATE 10 G PO PACK: 10.0000 g | PACK | Freq: Every day | ORAL | Status: DC

## 2022-11-15 MED ORDER — SODIUM CHLORIDE 0.9% IV SOLUTION: Freq: Once | INTRAVENOUS | Status: AC

## 2022-11-15 MED ORDER — SODIUM ZIRCONIUM CYCLOSILICATE 10 G PO PACK
10.0000 g | PACK | Freq: Once | ORAL | Status: AC
  Administered 2022-11-16: 10 g via ORAL
  Filled 2022-11-15: qty 1

## 2022-11-15 MED ORDER — SODIUM BICARBONATE 8.4 % IV SOLN
25.0000 meq | Freq: Once | INTRAVENOUS | Status: AC
  Administered 2022-11-15: 25 meq via INTRAVENOUS
  Filled 2022-11-15: qty 50

## 2022-11-15 MED ORDER — SODIUM ZIRCONIUM CYCLOSILICATE 10 G PO PACK
10.0000 g | PACK | Freq: Once | ORAL | Status: AC
  Administered 2022-11-15: 10 g via ORAL
  Filled 2022-11-15: qty 1

## 2022-11-15 MED ORDER — INSULIN ASPART 100 UNIT/ML IJ SOLN
10.0000 [IU] | Freq: Once | INTRAMUSCULAR | Status: AC
  Administered 2022-11-16: 10 [IU] via INTRAVENOUS

## 2022-11-15 NOTE — Progress Notes (Signed)
Physical Therapy Treatment Patient Details Name: Kelli Hudson MRN: [id number] DOB: [birth year] Today's Date: 11/15/2022   History of Present Illness Patient is 32 y.o. female presented to hospital 10/03/22 for bil foot wounds, fever, and pain for ~2 weeks with no improvement with antibiotics. Pt admitted for sepsis with necrotizing fasciitis/gangrene bilateral feet elected for surgical management. Pt underwent excision and debridement of BLE extending feet to thighs on 10/08/22 and remained intubated in ICU. 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 PMH significant for DM, HTN, gastric pacemaker.    PT Comments  Pt now s/p bilat AKA, pt lethargic and could barely keep eyes open during session and RR dipping to 8-10. Arousal improves with PT and OT interactions, of note pt is on dilaudid PCA pump.  Pt requesting no mobility today given severe LE pain, but pt agreeable to bed level therapy with encouragement. Pt tolerating AKA exercise, pull-to-sit, and rolling multiple times during session, overall requiring mod +2 assist at this time. Pt remains limited by pain. PT to continue to follow.      If plan is discharge home, recommend the following: Two people to help with walking and/or transfers;Two people to help with bathing/dressing/bathroom;Assistance with cooking/housework;Assist for transportation;Help with stairs or ramp for entrance;Supervision due to cognitive status   Can travel by private vehicle        Equipment Recommendations  Wheelchair (measurements PT);Wheelchair cushion (measurements PT);Other (comment)    Recommendations for Other Services       Precautions / Restrictions Precautions Precautions: Fall Restrictions Weight Bearing Restrictions: Yes RLE Weight Bearing: Non weight bearing LLE Weight Bearing: Non weight bearing Other Position/Activity Restrictions: B BKA on 9/6 with B AKA revision on 9/27     Mobility  Bed Mobility Overal  bed mobility: Needs Assistance Bed Mobility: Rolling Rolling: Mod assist, +2 for physical assistance, Used rails         General bed mobility comments: Min to Mod assist +2 to come to long sit in the bed with CGA to Min assist to maintain sitting balance in long sit.    Transfers Overall transfer level: Needs assistance                 General transfer comment: deferred this session    Ambulation/Gait                   Stairs             Wheelchair Mobility     Tilt Bed    Modified Rankin (Stroke Patients Only)       Balance Overall balance assessment: Needs assistance Sitting-balance support: Single extremity supported, Bilateral upper extremity supported, Feet supported Sitting balance-Leahy Scale: Poor Sitting balance - Comments: in long sit in bed, requires posterior support                                    Cognition Arousal: Lethargic, Suspect due to medications Behavior During Therapy: WFL for tasks assessed/performed Overall Cognitive Status: Within Functional Limits for tasks assessed                                          Exercises Amputee Exercises Hip ABduction/ADduction: AAROM, Both, 10 reps, Supine    General Comments General comments (skin integrity,  edema, etc.): Pt with HR from 80s to 110s, RR between 9 and 22, and O2 sat largely >/95% on RA with one brief episode of O2 sat dropping into upper 70s then quickly recovering to 100% on RA. Pt with nausea with movement and 2 episodes of vomiting during session.      Pertinent Vitals/Pain Pain Assessment Pain Assessment: Faces Faces Pain Scale: Hurts whole lot Pain Location: B LE Pain Descriptors / Indicators: Aching, Grimacing, Guarding, Sore Pain Intervention(s): Limited activity within patient's tolerance, Monitored during session, Repositioned    Home Living                          Prior Function            PT Goals  (current goals can now be found in the care plan section) Acute Rehab PT Goals PT Goal Formulation: With patient Time For Goal Achievement: 11/29/22 Potential to Achieve Goals: Good Progress towards PT goals: Progressing toward goals    Frequency    Min 1X/week      PT Plan      Co-evaluation   Reason for Co-Treatment: For patient/therapist safety PT goals addressed during session: Mobility/safety with mobility OT goals addressed during session: ADL's and self-care;Strengthening/ROM      AM-PAC PT "6 Clicks" Mobility   Outcome Measure  Help needed turning from your back to your side while in a flat bed without using bedrails?: A Little Help needed moving from lying on your back to sitting on the side of a flat bed without using bedrails?: A Lot Help needed moving to and from a bed to a chair (including a wheelchair)?: Total Help needed standing up from a chair using your arms (e.g., wheelchair or bedside chair)?: Total Help needed to walk in hospital room?: Total Help needed climbing 3-5 steps with a railing? : Total 6 Click Score: 9    End of Session   Activity Tolerance: Patient limited by pain;Patient limited by lethargy Patient left: with call bell/phone within reach;in bed Nurse Communication: Mobility status PT Visit Diagnosis: Other abnormalities of gait and mobility (R26.89);Muscle weakness (generalized) (M62.81);Pain Pain - Right/Left:  (bilat) Pain - part of body: Leg     Time: 6010-9323 PT Time Calculation (min) (ACUTE ONLY): 44 min  Charges:    $Therapeutic Activity: 8-22 mins PT General Charges $$ ACUTE PT VISIT: 1 Visit                     Marye Round, PT DPT Acute Rehabilitation Services Secure Chat Preferred  Office [phone]    [name] 11/15/2022, 4:04 PM

## 2022-11-15 NOTE — Progress Notes (Signed)
Occupational Therapy Treatment Patient Details Name: Kelli Hudson MRN: [id number] DOB: 27-Oct-1990 Today's Date: 11/15/2022   History of present illness Patient is 32 y.o. female presented to hospital 10/03/22 for bil foot wounds, fever, and pain for ~2 weeks with no improvement with antibiotics. Pt admitted for sepsis with necrotizing fasciitis/gangrene bilateral feet elected for surgical management. Pt underwent excision and debridement of BLE extending feet to thighs on 10/08/22 and remained intubated in ICU. 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 PMH significant for DM, HTN, gastric pacemaker.   OT comments  Pt now s/p B AKA performed on 9/27. OT instructed pt in techniques for increased safety and independence with ADLs from bed level and in B UE therapeutic exercises to increase strength and activity tolerance for carryover to functional tasks. Pt currently demonstrates ability to complete UB ADLs Independent to Mod assist and LB ADLs with Max assist +2 from bed level. Pt participated well in session and is making progress toward OT goals. However, pt limited this session by lethargy, pain, and nausea and vomiting with movement. OT goals updated this day. Pt will benefit from continued acute skilled OT services to address deficits outlined below and increase safety and independence with functional tasks. Post acute discharge, pt will benefit from intensive inpatient skilled rehab services < 3 hours per day to maximize rehab potential.       If plan is discharge home, recommend the following:  Two people to help with walking and/or transfers;Two people to help with bathing/dressing/bathroom;Assistance with cooking/housework;Assist for transportation;Help with stairs or ramp for entrance   Equipment Recommendations  Wheelchair (measurements OT);Wheelchair cushion (measurements OT)    Recommendations for Other Services      Precautions / Restrictions  Precautions Precautions: Fall Restrictions Weight Bearing Restrictions: Yes RLE Weight Bearing: Non weight bearing LLE Weight Bearing: Non weight bearing Other Position/Activity Restrictions: B BKA on 9/6 with B AKA revision on 9/27       Mobility Bed Mobility Overal bed mobility: Needs Assistance Bed Mobility: Rolling Rolling: Mod assist, +2 for physical assistance, Used rails         General bed mobility comments: Min to Mod assist +2 to come to long sit in the bed with CGA to Min assist to maintain sitting balance in long sit.    Transfers Overall transfer level: Needs assistance                 General transfer comment: deferred this session     Balance Overall balance assessment: Needs assistance Sitting-balance support: Single extremity supported, Bilateral upper extremity supported, Feet supported Sitting balance-Leahy Scale: Poor Sitting balance - Comments: in long sit in bed                                   ADL either performed or assessed with clinical judgement   ADL Overall ADL's : Needs assistance/impaired Eating/Feeding: Independent;Bed level   Grooming: Set up;Bed level   Upper Body Bathing: Minimal assistance;Moderate assistance;Bed level   Lower Body Bathing: Maximal assistance;Bed level;+2 for physical assistance   Upper Body Dressing : Contact guard assist;Bed level   Lower Body Dressing: Maximal assistance;Bed level;+2 for physical assistance     Toilet Transfer Details (indicate cue type and reason): deferred this session for pt/therapist safety Toileting- Clothing Manipulation and Hygiene: Maximal assistance;Bed level;+2 for physical assistance  General ADL Comments: Pt functional level affected by alertness level this day. Pt presenting lethary affecting functional level. Pt with moments of alertness throughout session but quickly becoming lethargic again, likely due to medication. Pt fatiguing quickly this  session.    Extremity/Trunk Assessment Upper Extremity Assessment Upper Extremity Assessment: Right hand dominant;Generalized weakness;RUE deficits/detail;LUE deficits/detail RUE Deficits / Details: MMT 4/5 overall LUE Deficits / Details: MMT 4-/5   Lower Extremity Assessment Lower Extremity Assessment: Defer to PT evaluation RLE Deficits / Details: post AKA on 9/27 LLE Deficits / Details: post AKA on 9/27        Vision       Perception     Praxis      Cognition Arousal: Lethargic, Suspect due to medications Behavior During Therapy: WFL for tasks assessed/performed Overall Cognitive Status: Within Functional Limits for tasks assessed                                          Exercises Exercises: General Upper Extremity General Exercises - Upper Extremity Shoulder Flexion: AROM, Both, 10 reps, Strengthening, Supine (with HOB elevated; increased activity tolerance) Other Exercises Other Exercises: Chest press; AROM; both; 10 reps; increased strength; increased activity tolerance    Shoulder Instructions       General Comments Pt with HR from 80s to 110s, RR between 9 and 22, and O2 sat largely >/95% on RA with one brief episode of O2 sat dropping into upper 70s then quickly recovering to 100% on RA. Pt with nausea with movement and 2 episodes of vomiting during session.    Pertinent Vitals/ Pain       Pain Assessment Pain Assessment: Faces Faces Pain Scale: Hurts whole lot (6 at rest in bed; 8 with rolling in the bed) Pain Location: B LE Pain Descriptors / Indicators: Aching, Grimacing, Guarding, Sore Pain Intervention(s): Limited activity within patient's tolerance, Monitored during session, Repositioned, Premedicated before session  Home Living                                          Prior Functioning/Environment              Frequency           Progress Toward Goals  OT Goals(current goals can now be found in the  care plan section)  Progress towards OT goals: Progressing toward goals (Overall making progress. Pt limited this session due to pain, alertness level, and nausea and vomiting.)  Acute Rehab OT Goals Patient Stated Goal: To get prosthetics and be independent OT Goal Formulation: With patient Time For Goal Achievement: 11/29/22 Potential to Achieve Goals: Good ADL Goals Pt Will Perform Grooming: sitting;with modified independence Pt Will Perform Lower Body Bathing: bed level;with set-up Pt Will Perform Lower Body Dressing: bed level;with supervision;with set-up Pt Will Transfer to Toilet: anterior/posterior transfer;with contact guard assist;bedside commode Pt/caregiver will Perform Home Exercise Program: Increased strength;Both right and left upper extremity;With theraband;With written HEP provided  Plan      Co-evaluation    PT/OT/SLP Co-Evaluation/Treatment: Yes Reason for Co-Treatment: For patient/therapist safety   OT goals addressed during session: ADL's and self-care;Strengthening/ROM      AM-PAC OT "6 Clicks" Daily Activity     Outcome Measure   Help from another person eating meals?: None  Help from another person taking care of personal grooming?: A Little Help from another person toileting, which includes using toliet, bedpan, or urinal?: A Lot Help from another person bathing (including washing, rinsing, drying)?: A Lot Help from another person to put on and taking off regular upper body clothing?: A Little Help from another person to put on and taking off regular lower body clothing?: A Lot 6 Click Score: 16    End of Session    OT Visit Diagnosis: Muscle weakness (generalized) (M62.81);Pain   Activity Tolerance Patient tolerated treatment well;Patient limited by fatigue;Patient limited by lethargy;Other (comment) (Limited by nausea, vomiting)   Patient Left in bed;with call bell/phone within reach   Nurse Communication Mobility status;Other (comment) (Pt had  bowel movement. Pt with nausea and vomiting.)        Time: 4132-4401 OT Time Calculation (min): 44 min  Charges: OT General Charges $OT Visit: 1 Visit OT Treatments $Self Care/Home Management : 8-22 mins  Savyon Loken "Orson Eva., OTR/L, MA Acute Rehab [phone]   Lendon Colonel 11/15/2022, 1:29 PM

## 2022-11-15 NOTE — Progress Notes (Signed)
INFECTIOUS DISEASE ATTENDING ADDENDUM:   Date: 11/15/2022  Patient name: Kelli Hudson  Medical record number: [id number]  Date of birth: 06/08/1990   Cr and K up  I Have stopped the high dose bactrim  IF she now has good source control with AKA I would not think she would need all of her antibiotics for much longer regardless  I do not see that she was bacteremic  I will maintain other antibiotics for now and follow culture data and Dr. Audrie Lia postop findings    Paulette Blanch [facility] 11/15/2022, 9:58 AM

## 2022-11-15 NOTE — Progress Notes (Signed)
Patient's PCA alarming d/t low respirations. Patient does wake up and answers question appropriately.  Encouraged patient to take deep breaths to get her respirations when she hears the beeping indicating that the respirations are low.   Patient keeps locking out the PCA b/c of the alarming.

## 2022-11-15 NOTE — Progress Notes (Signed)
Notified On-call physician of critical labs received this a.m. for hgb of 6.6 and potassium of 6.4.

## 2022-11-15 NOTE — Progress Notes (Signed)
PROGRESS NOTE    Kelli Hudson  ZOX:[id number] DOB: December 11, 1990 DOA: 10/03/2022 PCP: Norm Salt, PA    Brief Narrative:  32 y.o. female with medical history significant of DM1, diabetic gastroparesis with very frequent admission in the past, HTN presents to the hospital with bilateral foot wounds for about 2 weeks.  Not relieved with oral antibiotics.  She was admitted and treated with IV antibiotics but ultimately required extensive debridement and subsequent amputations for necrotizing fasciitis.   Chronology of events as below.  Since her surgery intermittently having anemia without any obvious signs of blood loss requiring PRBC transfusion, WBC still remains elevated but stable. Repeat CT showed persistent fluid collection, s/p OR 9/18, and again on 9/20 for repeat debridement.  Polymicrobial infection. 9/25, back to the OR for more debridement.  Nonviable tissue on below-knee amputation stumps and thigh wounds. 9/27, back to OR for revision.    Assessment & Plan:   Principal Problem:   Necrotizing fasciitis (HCC) Active Problems:   AKI (acute kidney injury) (HCC)   Sepsis due to cellulitis (HCC)   Cellulitis of lower extremity   Anemia   Diabetic gastroparesis associated with type 1 diabetes mellitus (HCC)   DM type 1 (diabetes mellitus, type 1) (HCC)   Drug-seeking behavior   Sepsis (HCC)   Necrotizing fasciitis of pelvic region and thigh (HCC)   Necrotizing fasciitis of lower leg (HCC)   MDD (major depressive disorder), recurrent episode, moderate (HCC)   Severe sepsis due to bilateral leg abscess, necrotizing fasciitis, POA.  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 -I&D 8/21 -Bilateral BKA on 9/6 -Back to OR and multiple debridements 9/18, 9/19, 9/25.  Amputation revisions. -9/27, revision and bilateral above-knee amputations. 9/18, culture with steno and Klebsiella, MDR ESBL and rare yeast 9/20, surgical culture with  Klebsiella and steno.   On multiple antibiotics.  Currently on meropenem, Diflucan and Bactrim. Followed by ID.  Bactrim discontinued today due to developing hyperkalemia and AKI. Severe painful condition.  Currently on Dilaudid PCA.  Hypotension with tachycardia Stable today. continuing Midodrine 5 mg 3 times daily.  Acute blood loss anemia due to operative blood loss Total 14 units of blood transfusion.  Hemoglobin 6.  1 unit of PRBC today.  Continue monitoring.   Type 1 diabetes, poorly controlled with intermittent hypoglycemia Blood sugars are controlled on long-acting insulin 4 units twice daily, sliding scale insulin.  Avoid hypoglycemia.     Acute kidney injury on CKD stage IIIa due to septic ATN, improving Non-anion gap metabolic acidosis, hyperkalemia. Hyponatremia, euvolemic Renal functions slightly worse today.  Potassium 6.4. Given temporizing measures including insulin, bicarb and Lokelma.  Will keep on scheduled Lokelma.  Bactrim discontinued and hopefully this will help.  Recheck tomorrow morning.   Severe protein calorie malnutrition Encouraging oral diet, dietitian following.  Encourage oral diet Fortunately, she does not have gastroparesis symptoms these days and able to eat regular diet.  Occasionally using nausea medicine.   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  Currently not having active symptoms.  Occasionally using nausea medicine.   Hypertension IV as needed   Depression -On Cymbalta at home, continued    DVT prophylaxis: heparin   Code Status: Full Code  Family Communication: None today.  Status is: Inpatient Remains inpatient appropriate because: Immediate postop.  On IV antibiotics.  Subjective:  Patient seen and examined.  After increasing dose of basal Dilaudid on PCA, patient felt well.  She tells me she did not sleep  overnight.  Patient used 15 mg of Dilaudid in about 16 hours.  Pain is controlled.  Denies any nausea vomiting.   Legs are swollen and edematous.   Examination:  General: Patient with flat affect.  Not in any pain. Cardiovascular: S1-S2 normal.  Regular rate rhythm. Respiratory: Bilateral clear.  No added sounds. Gastrointestinal: Soft.  Nontender.  Bowel sound present. Ext: Lower extremities on bulky bandages.  Patient has extensive subcutaneous swelling and edema extending up to the lower abdominal wall.  Dressing not removed by me. Picc line right arm. Neuro: Alert and awake.       Diet Orders (From admission, onward)     Start     Ordered   11/14/22 1347  Diet Carb Modified Fluid consistency: Thin; Room service appropriate? Yes  Diet effective now       Question Answer Comment  Calorie Level Medium 1600-2000   Fluid consistency: Thin   Room service appropriate? Yes      11/14/22 1346            Objective: Vitals:   11/15/22 0326 11/15/22 0400 11/15/22 0842 11/15/22 1116  BP: 96/60     Pulse: 90  100   Resp: 15 20    Temp: 97.8 F (36.6 C)  98.1 F (36.7 C) 98.1 F (36.7 C)  TempSrc: Oral  Axillary Axillary  SpO2: 100% 100% 100%   Weight:      Height:        Intake/Output Summary (Last 24 hours) at 11/15/2022 1324 Last data filed at 11/15/2022 0845 Gross per 24 hour  Intake 1136.88 ml  Output --  Net 1136.88 ml   Filed Weights   11/10/22 0500 11/11/22 0331 11/12/22 0313  Weight: 101.8 kg 100 kg 100.1 kg    Scheduled Meds:  sodium chloride   Intravenous Once   acetaminophen  1,000 mg Oral Q6H   vitamin C  1,000 mg Oral Daily   Chlorhexidine Gluconate Cloth  6 each Topical Daily   dextrose  50 mL Intravenous Once   docusate sodium  100 mg Oral Daily   DULoxetine  30 mg Oral Daily   feeding supplement (GLUCERNA SHAKE)  237 mL Oral TID BM   fluconazole  400 mg Oral Daily   gabapentin  300 mg Oral Q8H   heparin injection (subcutaneous)  5,000 Units Subcutaneous Q8H   hydrocortisone cream   Topical TID   HYDROmorphone   Intravenous Q4H   insulin aspart  0-5  Units Subcutaneous QHS   insulin aspart  0-6 Units Subcutaneous TID WC   insulin glargine-yfgn  4 Units Subcutaneous BID   methocarbamol  1,000 mg Oral TID   metoCLOPramide  10 mg Oral Q8H   metoprolol tartrate  25 mg Oral BID   midodrine  5 mg Oral TID WC   minocycline  200 mg Oral BID   multivitamin with minerals  1 tablet Oral Daily   nutrition supplement (JUVEN)  1 packet Oral BID BM   omeprazole  40 mg Oral Daily   pantoprazole  40 mg Oral Daily   silver nitrate applicators  1 Application Topical Once   sodium chloride flush  10-40 mL Intracatheter Q12H   [START ON 11/16/2022] sodium zirconium cyclosilicate  10 g Oral Daily   zinc sulfate  220 mg Oral Daily   Continuous Infusions:  sodium chloride 10 mL/hr at 11/13/22 1745   sodium chloride 20 mL/hr at 11/13/22 1745   sodium chloride     magnesium  sulfate bolus IVPB     meropenem (MERREM) IV 1 g (11/15/22 0606)   methocarbamol (ROBAXIN) IV Stopped (11/13/22 0617)    Nutritional status Signs/Symptoms: estimated needs Interventions: Juven, Glucerna shake Body mass index is 34.56 kg/m.  Data Reviewed:   CBC: Recent Labs  Lab 11/10/22 0350 11/11/22 0340 11/13/22 0600 11/13/22 2115 11/14/22 0500 11/15/22 0530  WBC 35.1* 33.5* 31.2*  --  29.4* 33.8*  HGB 8.8* 8.7* 6.2* 8.5* 8.4* 6.6*  HCT 27.6* 27.5* 19.4* 25.8* 25.4* 20.2*  MCV 95.2 93.2 94.6  --  90.7 94.0  PLT 565* 622* 544*  --  560* 507*   Basic Metabolic Panel: Recent Labs  Lab 11/10/22 0350 11/11/22 0340 11/12/22 0309 11/13/22 0600 11/14/22 0712 11/15/22 0530  NA 131* 126* 129* 130* 132* 127*  K 5.1 5.1 5.5* 5.7* 5.5* 6.4*  CL 105 101 105 108 107 105  CO2 19* 20* 20* 18* 19* 16*  GLUCOSE 229* 162* 100* 195* 96 381*  BUN 35* 36* 35* 32* 30* 40*  CREATININE 1.15* 1.07* 1.19* 1.60* 1.37* 1.97*  CALCIUM 7.4* 7.3* 7.7* 6.7* 7.4* 7.2*  MG 1.8  --   --   --   --   --    GFR: Estimated Creatinine Clearance: 50.3 mL/min (A) (by C-G formula based on SCr  of 1.97 mg/dL (H)). Liver Function Tests: No results for input(s): "AST", "ALT", "ALKPHOS", "BILITOT", "PROT", "ALBUMIN" in the last 168 hours. No results for input(s): "LIPASE", "AMYLASE" in the last 168 hours. No results for input(s): "AMMONIA" in the last 168 hours. Coagulation Profile: No results for input(s): "INR", "PROTIME" in the last 168 hours. Cardiac Enzymes: No results for input(s): "CKTOTAL", "CKMB", "CKMBINDEX", "TROPONINI" in the last 168 hours. BNP (last 3 results) No results for input(s): "PROBNP" in the last 8760 hours. HbA1C: No results for input(s): "HGBA1C" in the last 72 hours. CBG: Recent Labs  Lab 11/14/22 1646 11/14/22 2026 11/15/22 0554 11/15/22 1111 11/15/22 1112  GLUCAP 134* 197* 311* 326* 343*   Lipid Profile: No results for input(s): "CHOL", "HDL", "LDLCALC", "TRIG", "CHOLHDL", "LDLDIRECT" in the last 72 hours. Thyroid Function Tests: No results for input(s): "TSH", "T4TOTAL", "FREET4", "T3FREE", "THYROIDAB" in the last 72 hours. Anemia Panel: No results for input(s): "VITAMINB12", "FOLATE", "FERRITIN", "TIBC", "IRON", "RETICCTPCT" in the last 72 hours. Sepsis Labs: No results for input(s): "PROCALCITON", "LATICACIDVEN" in the last 168 hours.  Recent Results (from the past 240 hour(s))  Culture, blood (Routine X 2) w Reflex to ID Panel     Status: None   Collection Time: 11/06/22  7:14 PM   Specimen: BLOOD  Result Value Ref Range Status   Specimen Description BLOOD SITE NOT SPECIFIED  Final   Special Requests   Final    BOTTLES DRAWN AEROBIC AND ANAEROBIC Blood Culture adequate volume   Culture   Final    NO GROWTH 5 DAYS Performed at [facility], [address] [place], [place] [address]    Report Status 11/11/2022 FINAL  Final  Culture, blood (Routine X 2) w Reflex to ID Panel     Status: None   Collection Time: 11/06/22  7:14 PM   Specimen: BLOOD  Result Value Ref Range Status   Specimen Description BLOOD SITE NOT SPECIFIED   Final   Special Requests   Final    BOTTLES DRAWN AEROBIC AND ANAEROBIC Blood Culture adequate volume   Culture   Final    NO GROWTH 5 DAYS Performed at [facility], [address] [place], [place] [address]    Report Status 11/11/2022 FINAL  Final  Aerobic/Anaerobic Culture w Gram Stain (surgical/deep wound)     Status: None   Collection Time: 11/07/22 12:00 PM   Specimen: Wound; Tissue  Result Value Ref Range Status   Specimen Description WOUND  Final   Special Requests medial left leg  Final   Gram Stain   Final    FEW WBC PRESENT, PREDOMINANTLY PMN NO ORGANISMS SEEN    Culture   Final    RARE STENOTROPHOMONAS MALTOPHILIA SUSCEPTIBILITIES PERFORMED ON PREVIOUS CULTURE WITHIN THE LAST 5 DAYS. NO ANAEROBES ISOLATED    Report Status 11/12/2022 FINAL  Final  Aerobic/Anaerobic Culture w Gram Stain (surgical/deep wound)     Status: None   Collection Time: 11/07/22 12:18 PM   Specimen: Wound; Tissue  Result Value Ref Range Status   Specimen Description TISSUE  Final   Special Requests right BKA TISSUE  Final   Gram Stain   Final    FEW WBC PRESENT, PREDOMINANTLY PMN NO ORGANISMS SEEN    Culture   Final    RARE KLEBSIELLA PNEUMONIAE RARE STENOTROPHOMONAS MALTOPHILIA SUSCEPTIBILITIES PERFORMED ON PREVIOUS CULTURE WITHIN THE LAST 5 DAYS. NO ANAEROBES ISOLATED Performed at [facility], [address], [place], [place] [address]    Report Status 11/12/2022 FINAL  Final  Aerobic/Anaerobic Culture w Gram Stain (surgical/deep wound)     Status: None   Collection Time: 11/07/22 12:27 PM   Specimen: Wound; Tissue  Result Value Ref Range Status   Specimen Description TISSUE  Final   Special Requests lateral left thigh  Final   Gram Stain   Final    RARE WBC PRESENT, PREDOMINANTLY PMN NO ORGANISMS SEEN    Culture   Final    RARE KLEBSIELLA PNEUMONIAE RARE STENOTROPHOMONAS MALTOPHILIA NO ANAEROBES ISOLATED Performed at [facility], [address]  [place], [place] [address]    Report Status 11/12/2022 FINAL  Final   Organism ID, Bacteria STENOTROPHOMONAS MALTOPHILIA  Final   Organism ID, Bacteria KLEBSIELLA PNEUMONIAE  Final      Susceptibility   Klebsiella pneumoniae - MIC*    AMPICILLIN >=32 RESISTANT Resistant     CEFEPIME <=0.12 SENSITIVE Sensitive     CEFTAZIDIME <=1 SENSITIVE Sensitive     CEFTRIAXONE <=0.25 SENSITIVE Sensitive     CIPROFLOXACIN >=4 RESISTANT Resistant     GENTAMICIN >=16 RESISTANT Resistant     IMIPENEM <=0.25 SENSITIVE Sensitive     TRIMETH/SULFA <=20 SENSITIVE Sensitive     AMPICILLIN/SULBACTAM >=32 RESISTANT Resistant     PIP/TAZO 32 INTERMEDIATE Intermediate     * RARE KLEBSIELLA PNEUMONIAE   Stenotrophomonas maltophilia - MIC*    LEVOFLOXACIN 0.5 SENSITIVE Sensitive     TRIMETH/SULFA <=20 SENSITIVE Sensitive     * RARE STENOTROPHOMONAS MALTOPHILIA  Aerobic/Anaerobic Culture w Gram Stain (surgical/deep wound)     Status: None   Collection Time: 11/07/22 12:27 PM   Specimen: Wound; Tissue  Result Value Ref Range Status   Specimen Description TISSUE  Final   Special Requests left BKA stamp  Final   Gram Stain   Final    FEW WBC PRESENT,BOTH PMN AND MONONUCLEAR NO ORGANISMS SEEN    Culture   Final    RARE KLEBSIELLA PNEUMONIAE SUSCEPTIBILITIES PERFORMED ON PREVIOUS CULTURE WITHIN THE LAST 5 DAYS. NO ANAEROBES ISOLATED Performed at [facility], [address], [place], [place] [address]    Report  Status 11/12/2022 FINAL  Final  Aerobic/Anaerobic Culture w Gram Stain (surgical/deep wound)     Status: None (Preliminary result)   Collection Time: 11/12/22  4:46 PM   Specimen: Soft Tissue, Other  Result Value Ref Range Status   Specimen Description TISSUE  Final   Special Requests LEFT THIGH WD  Final   Gram Stain   Final    FEW WBC PRESENT,BOTH PMN AND MONONUCLEAR NO ORGANISMS SEEN    Culture   Final    NO GROWTH 2 DAYS NO ANAEROBES ISOLATED; CULTURE IN PROGRESS FOR 5 DAYS Performed at  [facility], [address], [place], [place] [address]    Report Status PENDING  Incomplete  Aerobic/Anaerobic Culture w Gram Stain (surgical/deep wound)     Status: None (Preliminary result)   Collection Time: 11/12/22  4:52 PM   Specimen: Soft Tissue, Other  Result Value Ref Range Status   Specimen Description TISSUE  Final   Special Requests RIGHT THIGH WD SPEC B  Final   Gram Stain   Final    FEW WBC PRESENT,BOTH PMN AND MONONUCLEAR NO ORGANISMS SEEN    Culture   Final    NO GROWTH 2 DAYS NO ANAEROBES ISOLATED; CULTURE IN PROGRESS FOR 5 DAYS Performed at [facility], [address], [place], [place] [address]    Report Status PENDING  Incomplete         Radiology Studies: No results found.         LOS: 43 days   Time spent= 35 mins

## 2022-11-15 NOTE — Progress Notes (Signed)
Patient states that she remains in considerable pain.  10/10 on pain scale.  Patient noted to be very sleepy but easily aroused.  In this 12 hour shift, patient's PCA stats are 42 demands/26 rec'd.

## 2022-11-16 DIAGNOSIS — Z7189 Other specified counseling: Secondary | ICD-10-CM

## 2022-11-16 DIAGNOSIS — Z515 Encounter for palliative care: Secondary | ICD-10-CM

## 2022-11-16 DIAGNOSIS — M726 Necrotizing fasciitis: Secondary | ICD-10-CM | POA: Diagnosis not present

## 2022-11-16 LAB — GLUCOSE, CAPILLARY
Glucose-Capillary: 146 mg/dL — ABNORMAL HIGH (ref 70–99)
Glucose-Capillary: 86 mg/dL (ref 70–99)
Glucose-Capillary: 127 mg/dL — ABNORMAL HIGH (ref 70–99)
Glucose-Capillary: 150 mg/dL — ABNORMAL HIGH (ref 70–99)
Glucose-Capillary: 131 mg/dL — ABNORMAL HIGH (ref 70–99)

## 2022-11-16 LAB — PREPARE RBC (CROSSMATCH)

## 2022-11-16 LAB — CBC
HCT: 23.8 % — ABNORMAL LOW (ref 36.0–46.0)
Hemoglobin: 7.9 g/dL — ABNORMAL LOW (ref 12.0–15.0)
MCH: 30.5 pg (ref 26.0–34.0)
MCHC: 33.2 g/dL (ref 30.0–36.0)
MCV: 91.9 fL (ref 80.0–100.0)
Platelets: 461 10*3/uL — ABNORMAL HIGH (ref 150–400)
RBC: 2.59 MIL/uL — ABNORMAL LOW (ref 3.87–5.11)
RDW: 14.6 % (ref 11.5–15.5)
WBC: 23.7 10*3/uL — ABNORMAL HIGH (ref 4.0–10.5)
nRBC: 0 % (ref 0.0–0.2)

## 2022-11-16 LAB — BASIC METABOLIC PANEL
Anion gap: 7 (ref 5–15)
BUN: 49 mg/dL — ABNORMAL HIGH (ref 6–20)
CO2: 19 mmol/L — ABNORMAL LOW (ref 22–32)
Calcium: 7.5 mg/dL — ABNORMAL LOW (ref 8.9–10.3)
Chloride: 104 mmol/L (ref 98–111)
Creatinine, Ser: 1.7 mg/dL — ABNORMAL HIGH (ref 0.44–1.00)
GFR, Estimated: 41 mL/min — ABNORMAL LOW (ref >60)
Glucose, Bld: 82 mg/dL (ref 70–99)
Potassium: 5.6 mmol/L — ABNORMAL HIGH (ref 3.5–5.1)
Sodium: 130 mmol/L — ABNORMAL LOW (ref 135–145)

## 2022-11-16 LAB — HEMOGLOBIN AND HEMATOCRIT, BLOOD
HCT: 25.5 % — ABNORMAL LOW (ref 36.0–46.0)
Hemoglobin: 8.6 g/dL — ABNORMAL LOW (ref 12.0–15.0)

## 2022-11-16 MED ORDER — SODIUM CHLORIDE 0.9% IV SOLUTION: Freq: Once | INTRAVENOUS | Status: AC

## 2022-11-16 MED ORDER — SODIUM ZIRCONIUM CYCLOSILICATE 10 G PO PACK
10.0000 g | PACK | Freq: Every day | ORAL | Status: DC
  Administered 2022-11-16 – 2022-11-19 (×3): 10 g via ORAL
  Filled 2022-11-16 (×3): qty 1

## 2022-11-16 NOTE — Plan of Care (Signed)
°  Problem: Education: Goal: Knowledge of General Education information will improve Description: Including pain rating scale, medication(s)/side effects and non-pharmacologic comfort measures Outcome: Progressing   Problem: Health Behavior/Discharge Planning: Goal: Ability to manage health-related needs will improve Outcome: Progressing   Problem: Clinical Measurements: Goal: Ability to maintain clinical measurements within normal limits will improve Outcome: Progressing Goal: Will remain free from infection Outcome: Progressing Goal: Diagnostic test results will improve Outcome: Progressing Goal: Respiratory complications will improve Outcome: Progressing Goal: Cardiovascular complication will be avoided Outcome: Progressing   Problem: Activity: Goal: Risk for activity intolerance will decrease Outcome: Progressing   Problem: Nutrition: Goal: Adequate nutrition will be maintained Outcome: Progressing   Problem: Coping: Goal: Level of anxiety will decrease Outcome: Progressing   Problem: Elimination: Goal: Will not experience complications related to bowel motility Outcome: Progressing Goal: Will not experience complications related to urinary retention Outcome: Progressing   Problem: Pain Managment: Goal: General experience of comfort will improve Outcome: Progressing   Problem: Safety: Goal: Ability to remain free from injury will improve Outcome: Progressing   Problem: Skin Integrity: Goal: Risk for impaired skin integrity will decrease Outcome: Progressing   Problem: Education: Goal: Ability to describe self-care measures that may prevent or decrease complications (Diabetes Survival Skills Education) will improve Outcome: Progressing   Problem: Coping: Goal: Ability to adjust to condition or change in health will improve Outcome: Progressing   Problem: Fluid Volume: Goal: Ability to maintain a balanced intake and output will improve Outcome:  Progressing   Problem: Health Behavior/Discharge Planning: Goal: Ability to identify and utilize available resources and services will improve Outcome: Progressing Goal: Ability to manage health-related needs will improve Outcome: Progressing   Problem: Metabolic: Goal: Ability to maintain appropriate glucose levels will improve Outcome: Progressing   Problem: Nutritional: Goal: Maintenance of adequate nutrition will improve Outcome: Progressing Goal: Progress toward achieving an optimal weight will improve Outcome: Progressing   Problem: Skin Integrity: Goal: Risk for impaired skin integrity will decrease Outcome: Progressing   Problem: Tissue Perfusion: Goal: Adequacy of tissue perfusion will improve Outcome: Progressing   Problem: Education: Goal: Knowledge of the prescribed therapeutic regimen will improve Outcome: Progressing Goal: Ability to verbalize activity precautions or restrictions will improve Outcome: Progressing Goal: Understanding of discharge needs will improve Outcome: Progressing   Problem: Activity: Goal: Ability to perform//tolerate increased activity and mobilize with assistive devices will improve Outcome: Progressing   Problem: Clinical Measurements: Goal: Postoperative complications will be avoided or minimized Outcome: Progressing   Problem: Self-Care: Goal: Ability to meet self-care needs will improve Outcome: Progressing   Problem: Self-Concept: Goal: Ability to maintain and perform role responsibilities to the fullest extent possible will improve Outcome: Progressing   Problem: Pain Management: Goal: Pain level will decrease with appropriate interventions Outcome: Progressing   Problem: Skin Integrity: Goal: Demonstration of wound healing without infection will improve Outcome: Progressing

## 2022-11-16 NOTE — Progress Notes (Signed)
IV Team consult received to assess right arm with PICC due to swelling. R DL PICC flushed with good blood return. No swelling noted at or around PICC insertion site. Swelling noted in right arm below PICC dressing extending to mid forearm. Pt & 2nd IV RN, Deirdre Priest, states "arm is not as swollen as it was last week." Pt also states "swelling went down and has come back some."No swelling noted in right wrist or hand. Good pulse in right wrist. Primary RN to elevate arm. Suggest doppler study if swelling persists.

## 2022-11-16 NOTE — Progress Notes (Signed)
Date: 11/16/2022  Patient name: Kelli Hudson  Medical record number: [id number]  Date of birth: [birth year]   Await Dr. Audrie Lia assessement re whether there is any residual disease and if he concludes there is none would stop all of the antibiotics  If he remains concerned then please re-engage with [place] so we can arrange further antibiotics and followup  I will remove her from our rounding list for now.    Acey Lav 11/16/2022, 12:15 PM

## 2022-11-16 NOTE — Plan of Care (Signed)
  Problem: Clinical Measurements: Goal: Will remain free from infection Outcome: Not Progressing   Problem: Activity: Goal: Risk for activity intolerance will decrease Outcome: Not Progressing   Problem: Skin Integrity: Goal: Risk for impaired skin integrity will decrease Outcome: Not Progressing

## 2022-11-16 NOTE — Progress Notes (Signed)
PROGRESS NOTE    Kelli Hudson  WUX:[id number] DOB: 05/27/1990 DOA: 10/03/2022 PCP: Norm Salt, PA    Brief Narrative:  32 y.o. female with medical history significant of DM1, diabetic gastroparesis with very frequent admission in the past, HTN presents to the hospital with bilateral foot wounds for about 2 weeks.  Not relieved with oral antibiotics.  She was admitted and treated with IV antibiotics but ultimately required extensive debridement and subsequent amputations for necrotizing fasciitis.   Chronology of events as below.  Since her surgery intermittently having anemia without any obvious signs of blood loss requiring PRBC transfusion, WBC still remains elevated but stable. Repeat CT showed persistent fluid collection, s/p OR 9/18, and again on 9/20 for repeat debridement.  Polymicrobial infection. 9/25, back to the OR for more debridement.  Nonviable tissue on below-knee amputation stumps and thigh wounds. 9/27, back to OR for revision. Remains in the hospital, postop recovery, recurrent anemia.  Currently pain managed with Dilaudid PCA.    Assessment & Plan:   Principal Problem:   Necrotizing fasciitis (HCC) Active Problems:   AKI (acute kidney injury) (HCC)   Sepsis due to cellulitis (HCC)   Cellulitis of lower extremity   Anemia   Diabetic gastroparesis associated with type 1 diabetes mellitus (HCC)   DM type 1 (diabetes mellitus, type 1) (HCC)   Drug-seeking behavior   Sepsis (HCC)   Necrotizing fasciitis of pelvic region and thigh (HCC)   Necrotizing fasciitis of lower leg (HCC)   MDD (major depressive disorder), recurrent episode, moderate (HCC)   Severe sepsis due to bilateral leg abscess, necrotizing fasciitis, POA.  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 -I&D 8/21 -Bilateral BKA on 9/6 -Back to OR and multiple debridements 9/18, 9/19, 9/25.  Amputation revisions. -9/27, revision and bilateral above-knee  amputations. 9/18, culture with steno and Klebsiella, MDR ESBL and rare yeast 9/20, surgical culture with Klebsiella and steno.   On multiple antibiotics. Was on meropenem, Diflucan and Bactrim.  Followed by ID.  They may discontinue antibiotics and monitor. Severe painful condition. Currently on Dilaudid PCA.  Hypotension with tachycardia Stable today. continuing Midodrine 5 mg 3 times daily.  Acute blood loss anemia due to operative blood loss Total 16 units of blood transfusion.  1 unit overnight.  Hemoglobin checked immediately and it is 7.9.  Likely spurious.  She will need more blood due to ongoing loss.  Will prescribe 1 more unit today.  Recheck with a.m. labs.   Type 1 diabetes, poorly controlled with intermittent hypoglycemia Blood sugars are controlled on long-acting insulin 4 units twice daily, sliding scale insulin.  Avoid hypoglycemia.     Acute kidney injury on CKD stage IIIa due to septic ATN, improving Non-anion gap metabolic acidosis, hyperkalemia. Renal functions slightly fluctuates but more or less stable.  Potassium was 6.4, temporizing measures and Lokelma brought down to 5.6.  Will continue Lokelma every day. Given temporizing measures including insulin, bicarb and Lokelma.  Bactrim discontinued and hopefully this will help.  Recheck tomorrow morning.   Severe protein calorie malnutrition Encouraging oral diet, dietitian following.  Encourage oral diet Fortunately, she does not have gastroparesis symptoms these days and able to eat regular diet.  Occasionally using nausea medicine.   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  Currently not having active symptoms.  Occasionally using nausea medicine.   Hypertension IV as needed   Depression -On Cymbalta at home, continued    DVT prophylaxis: heparin   Code Status: Full  Code  Family Communication: None today.  Status is: Inpatient Remains inpatient appropriate because: Immediate postop.  On IV  antibiotics.  Subjective:  Patient was seen and examined.  Patient tells me her pain is well-controlled with current dose of PCA. Very painful to move around to use urinal, will place Foley catheter. Denies any nausea vomiting.  She is able to eat all her breakfast. Overnight events noted, given 1 unit of blood for hemoglobin of 6.7.  Given temporizing measures of potassium of 5.6.  Examination:  General: Chronically sick looking but currently comfortable.  She has a flat affect.  Denies any complaints. Cardiovascular: S1-S2 normal.  Regular rate rhythm. Respiratory: Bilateral clear.  No added sounds. Gastrointestinal: Soft.  Nontender.  Bowel sound present. Ext: Lower extremities on bulky bandages.  Patient has extensive subcutaneous swelling and edema extending up to the lower abdominal wall. Picc line right arm.  Mild swelling present.  Nontender. Neuro: Alert and awake.       Diet Orders (From admission, onward)     Start     Ordered   11/14/22 1347  Diet Carb Modified Fluid consistency: Thin; Room service appropriate? Yes  Diet effective now       Question Answer Comment  Calorie Level Medium 1600-2000   Fluid consistency: Thin   Room service appropriate? Yes      11/14/22 1346            Objective: Vitals:   11/16/22 0444 11/16/22 0446 11/16/22 0739 11/16/22 0916  BP:  120/89 (!) 139/91   Pulse:  95 98   Resp: 13 12 11 15   Temp:  98 F (36.7 C) 97.8 F (36.6 C)   TempSrc:  Oral Oral   SpO2: 100% 100% 100% 100%  Weight:      Height:        Intake/Output Summary (Last 24 hours) at 11/16/2022 1035 Last data filed at 11/16/2022 0449 Gross per 24 hour  Intake 1219.31 ml  Output --  Net 1219.31 ml   Filed Weights   11/10/22 0500 11/11/22 0331 11/12/22 0313  Weight: 101.8 kg 100 kg 100.1 kg    Scheduled Meds:  sodium chloride   Intravenous Once   sodium chloride   Intravenous Once   acetaminophen  1,000 mg Oral Q6H   vitamin C  1,000 mg Oral Daily    Chlorhexidine Gluconate Cloth  6 each Topical Daily   dextrose  50 mL Intravenous Once   docusate sodium  100 mg Oral Daily   DULoxetine  30 mg Oral Daily   feeding supplement (GLUCERNA SHAKE)  237 mL Oral TID BM   fluconazole  400 mg Oral Daily   gabapentin  300 mg Oral Q8H   heparin injection (subcutaneous)  5,000 Units Subcutaneous Q8H   hydrocortisone cream   Topical TID   HYDROmorphone   Intravenous Q4H   insulin aspart  0-5 Units Subcutaneous QHS   insulin aspart  0-6 Units Subcutaneous TID WC   insulin glargine-yfgn  4 Units Subcutaneous BID   methocarbamol  1,000 mg Oral TID   metoCLOPramide  10 mg Oral Q8H   metoprolol tartrate  25 mg Oral BID   midodrine  5 mg Oral TID WC   minocycline  200 mg Oral BID   multivitamin with minerals  1 tablet Oral Daily   nutrition supplement (JUVEN)  1 packet Oral BID BM   omeprazole  40 mg Oral Daily   pantoprazole  40 mg Oral Daily  silver nitrate applicators  1 Application Topical Once   sodium chloride flush  10-40 mL Intracatheter Q12H   sodium zirconium cyclosilicate  10 g Oral Daily   zinc sulfate  220 mg Oral Daily   Continuous Infusions:  sodium chloride 10 mL/hr at 11/13/22 1745   sodium chloride 20 mL/hr at 11/13/22 1745   sodium chloride     magnesium sulfate bolus IVPB     meropenem (MERREM) IV 1 g (11/16/22 0528)   methocarbamol (ROBAXIN) IV Stopped (11/13/22 0617)    Nutritional status Signs/Symptoms: estimated needs Interventions: Juven, Glucerna shake Body mass index is 34.56 kg/m.  Data Reviewed:   CBC: Recent Labs  Lab 11/11/22 0340 11/13/22 0600 11/13/22 2115 11/14/22 0500 11/15/22 0530 11/15/22 2215 11/16/22 0752  WBC 33.5* 31.2*  --  29.4* 33.8*  --  23.7*  HGB 8.7* 6.2* 8.5* 8.4* 6.6* 6.7* 7.9*  HCT 27.5* 19.4* 25.8* 25.4* 20.2* 19.7* 23.8*  MCV 93.2 94.6  --  90.7 94.0  --  91.9  PLT 622* 544*  --  560* 507*  --  461*   Basic Metabolic Panel: Recent Labs  Lab 11/10/22 0350  11/11/22 0340 11/12/22 0309 11/13/22 0600 11/14/22 0712 11/15/22 0530 11/15/22 2215 11/16/22 0752  NA 131*   < > 129* 130* 132* 127*  --  130*  K 5.1   < > 5.5* 5.7* 5.5* 6.4* 6.3* 5.6*  CL 105   < > 105 108 107 105  --  104  CO2 19*   < > 20* 18* 19* 16*  --  19*  GLUCOSE 229*   < > 100* 195* 96 381*  --  82  BUN 35*   < > 35* 32* 30* 40*  --  49*  CREATININE 1.15*   < > 1.19* 1.60* 1.37* 1.97*  --  1.70*  CALCIUM 7.4*   < > 7.7* 6.7* 7.4* 7.2*  --  7.5*  MG 1.8  --   --   --   --   --   --   --    < > = values in this interval not displayed.   GFR: Estimated Creatinine Clearance: 58.3 mL/min (A) (by C-G formula based on SCr of 1.7 mg/dL (H)). Liver Function Tests: No results for input(s): "AST", "ALT", "ALKPHOS", "BILITOT", "PROT", "ALBUMIN" in the last 168 hours. No results for input(s): "LIPASE", "AMYLASE" in the last 168 hours. No results for input(s): "AMMONIA" in the last 168 hours. Coagulation Profile: No results for input(s): "INR", "PROTIME" in the last 168 hours. Cardiac Enzymes: No results for input(s): "CKTOTAL", "CKMB", "CKMBINDEX", "TROPONINI" in the last 168 hours. BNP (last 3 results) No results for input(s): "PROBNP" in the last 8760 hours. HbA1C: No results for input(s): "HGBA1C" in the last 72 hours. CBG: Recent Labs  Lab 11/15/22 1551 11/15/22 2058 11/15/22 2352 11/16/22 0202 11/16/22 0544  GLUCAP 259* 167* 188* 146* 86   Lipid Profile: No results for input(s): "CHOL", "HDL", "LDLCALC", "TRIG", "CHOLHDL", "LDLDIRECT" in the last 72 hours. Thyroid Function Tests: No results for input(s): "TSH", "T4TOTAL", "FREET4", "T3FREE", "THYROIDAB" in the last 72 hours. Anemia Panel: No results for input(s): "VITAMINB12", "FOLATE", "FERRITIN", "TIBC", "IRON", "RETICCTPCT" in the last 72 hours. Sepsis Labs: No results for input(s): "PROCALCITON", "LATICACIDVEN" in the last 168 hours.  Recent Results (from the past 240 hour(s))  Culture, blood (Routine X 2)  w Reflex to ID Panel     Status: None   Collection Time: 11/06/22  7:14 PM  Specimen: BLOOD  Result Value Ref Range Status   Specimen Description BLOOD SITE NOT SPECIFIED  Final   Special Requests   Final    BOTTLES DRAWN AEROBIC AND ANAEROBIC Blood Culture adequate volume   Culture   Final    NO GROWTH 5 DAYS Performed at [facility], [address] [place], [place] [address]    Report Status 11/11/2022 FINAL  Final  Culture, blood (Routine X 2) w Reflex to ID Panel     Status: None   Collection Time: 11/06/22  7:14 PM   Specimen: BLOOD  Result Value Ref Range Status   Specimen Description BLOOD SITE NOT SPECIFIED  Final   Special Requests   Final    BOTTLES DRAWN AEROBIC AND ANAEROBIC Blood Culture adequate volume   Culture   Final    NO GROWTH 5 DAYS Performed at [facility], [address] [place], [place] [address]    Report Status 11/11/2022 FINAL  Final  Aerobic/Anaerobic Culture w Gram Stain (surgical/deep wound)     Status: None   Collection Time: 11/07/22 12:00 PM   Specimen: Wound; Tissue  Result Value Ref Range Status   Specimen Description WOUND  Final   Special Requests medial left leg  Final   Gram Stain   Final    FEW WBC PRESENT, PREDOMINANTLY PMN NO ORGANISMS SEEN    Culture   Final    RARE STENOTROPHOMONAS MALTOPHILIA SUSCEPTIBILITIES PERFORMED ON PREVIOUS CULTURE WITHIN THE LAST 5 DAYS. NO ANAEROBES ISOLATED    Report Status 11/12/2022 FINAL  Final  Aerobic/Anaerobic Culture w Gram Stain (surgical/deep wound)     Status: None   Collection Time: 11/07/22 12:18 PM   Specimen: Wound; Tissue  Result Value Ref Range Status   Specimen Description TISSUE  Final   Special Requests right BKA TISSUE  Final   Gram Stain   Final    FEW WBC PRESENT, PREDOMINANTLY PMN NO ORGANISMS SEEN    Culture   Final    RARE KLEBSIELLA PNEUMONIAE RARE STENOTROPHOMONAS MALTOPHILIA SUSCEPTIBILITIES PERFORMED ON PREVIOUS CULTURE WITHIN THE LAST 5 DAYS. NO  ANAEROBES ISOLATED Performed at [facility], [address] [place], [place] [address]    Report Status 11/12/2022 FINAL  Final  Aerobic/Anaerobic Culture w Gram Stain (surgical/deep wound)     Status: None (Preliminary result)   Collection Time: 11/07/22 12:27 PM   Specimen: Wound; Tissue  Result Value Ref Range Status   Specimen Description TISSUE  Final   Special Requests lateral left thigh  Final   Gram Stain   Final    RARE WBC PRESENT, PREDOMINANTLY PMN NO ORGANISMS SEEN    Culture   Final    RARE KLEBSIELLA PNEUMONIAE RARE STENOTROPHOMONAS MALTOPHILIA NO ANAEROBES ISOLATED Sent to [facility] for further susceptibility testing. Performed at [facility], [address] [place], [place] [address]    Report Status PENDING  Incomplete   Organism ID, Bacteria STENOTROPHOMONAS MALTOPHILIA  Final   Organism ID, Bacteria KLEBSIELLA PNEUMONIAE  Final      Susceptibility   Klebsiella pneumoniae - MIC*    AMPICILLIN >=32 RESISTANT Resistant     CEFEPIME <=0.12 SENSITIVE Sensitive     CEFTAZIDIME <=1 SENSITIVE Sensitive     CEFTRIAXONE <=0.25 SENSITIVE Sensitive     CIPROFLOXACIN >=4 RESISTANT Resistant     GENTAMICIN >=16 RESISTANT Resistant     IMIPENEM <=0.25 SENSITIVE Sensitive     TRIMETH/SULFA <=20 SENSITIVE Sensitive  AMPICILLIN/SULBACTAM >=32 RESISTANT Resistant     PIP/TAZO 32 INTERMEDIATE Intermediate     * RARE KLEBSIELLA PNEUMONIAE   Stenotrophomonas maltophilia - MIC*    LEVOFLOXACIN 0.5 SENSITIVE Sensitive     TRIMETH/SULFA <=20 SENSITIVE Sensitive     * RARE STENOTROPHOMONAS MALTOPHILIA  Aerobic/Anaerobic Culture w Gram Stain (surgical/deep wound)     Status: None   Collection Time: 11/07/22 12:27 PM   Specimen: Wound; Tissue  Result Value Ref Range Status   Specimen Description TISSUE  Final   Special Requests left BKA stamp  Final   Gram Stain   Final    FEW WBC PRESENT,BOTH PMN AND MONONUCLEAR NO ORGANISMS SEEN    Culture   Final    RARE  KLEBSIELLA PNEUMONIAE SUSCEPTIBILITIES PERFORMED ON PREVIOUS CULTURE WITHIN THE LAST 5 DAYS. NO ANAEROBES ISOLATED Performed at [facility], [address], [place], [place] [address]    Report Status 11/12/2022 FINAL  Final  Aerobic/Anaerobic Culture w Gram Stain (surgical/deep wound)     Status: None (Preliminary result)   Collection Time: 11/12/22  4:46 PM   Specimen: Soft Tissue, Other  Result Value Ref Range Status   Specimen Description TISSUE  Final   Special Requests LEFT THIGH WD  Final   Gram Stain   Final    FEW WBC PRESENT,BOTH PMN AND MONONUCLEAR NO ORGANISMS SEEN    Culture   Final    NO GROWTH 3 DAYS NO ANAEROBES ISOLATED; CULTURE IN PROGRESS FOR 5 DAYS Performed at [facility], [address], [place], [place] [address]    Report Status PENDING  Incomplete  Aerobic/Anaerobic Culture w Gram Stain (surgical/deep wound)     Status: None (Preliminary result)   Collection Time: 11/12/22  4:52 PM   Specimen: Soft Tissue, Other  Result Value Ref Range Status   Specimen Description TISSUE  Final   Special Requests RIGHT THIGH WD SPEC B  Final   Gram Stain   Final    FEW WBC PRESENT,BOTH PMN AND MONONUCLEAR NO ORGANISMS SEEN    Culture   Final    NO GROWTH 3 DAYS NO ANAEROBES ISOLATED; CULTURE IN PROGRESS FOR 5 DAYS Performed at [facility], [address] [place], [place] [address]    Report Status PENDING  Incomplete         Radiology Studies: No results found.         LOS: 44 days   Time spent= 35 mins

## 2022-11-16 NOTE — Consult Note (Cosign Needed Addendum)
Palliative Medicine Inpatient Consult Note  Consulting Provider: Dr. Jerral Ralph  Reason for consult:   Palliative Care Consult Services Symptom Management Consult   Palliative Medicine Consult  Reason for Consult? patient with Necrotizing fasciitis.   11/16/2022  HPI:  Per intake H&P --> 32 y.o. female with medical history significant of DM1, diabetic gastroparesis with very frequent admission in the past, HTN presents to the hospital with bilateral foot wounds for about 2 weeks. Not relieved with oral antibiotics. She was admitted and treated with IV antibiotics but ultimately required extensive debridement and subsequent amputations for necrotizing fasciitis. Palliative care has been asked to get involved to assist with goals of care conversations.   Clinical Assessment/Goals of Care:  *Please note that this is a verbal dictation therefore any spelling or grammatical errors are due to the "Dragon Medical One" system interpretation.  I have reviewed medical records including EPIC notes, labs and imaging, received report from bedside RN, assessed the patient who is lying in bed eating her breakfast omlette.    I met with Hedwig to further discuss diagnosis prognosis, GOC, EOL wishes, disposition and options.   I introduced Palliative Medicine as specialized medical care for people living with serious illness. It focuses on providing relief from the symptoms and stress of a serious illness. The goal is to improve quality of life for both the patient and the family.  Medical History Review and Understanding:  A review of Jasmine's history significant for type 1 diabetes, hypertension, and bilateral foot wounds was complete.  Social History:  Jenya shares that she lives in [place].  She is not married though has a boyfriend.  She has no children.  Jezelle has one surviving sister, one passed of a drug overdose, and she has three brothers. She shares that she loves hanging  out with her family, playing video games, and just relaxing.  She shares that she is a woman of faith and has her prayer Warriors looking out for her.  Functional and Nutritional State:  Preceding hospitalization, Sharronda was functional with basic ADLs and instrumental ADLs. Logyn shares that she was able to mobilize. Akira does have a fairly good appetite.  Palliative Symptoms:  Pain , acute on chronic - On a dilaudid PCA.  Urinary retention - Plan for foley placement.   Denies nausea, shortness of breath, itching.   Advance Directives:  A detailed discussion was had today regarding advanced directives.  Yes - these can be found on file. Maili's mother is her Runner, broadcasting/film/video.   Code Status:  Concepts specific to code status, artifical feeding and hydration, continued IV antibiotics and rehospitalization was had.  The difference between a aggressive medical intervention path  and a palliative comfort care path for this patient at this time was had.   Caylee would desire resuscitation efforts to be made. She would not want to be in a prolonged state of partial arousal rather than true awareness. She shares that she would not desire a tracheostomy.   Provided "Hard Choices" booklet for review.  Discussion:  Luvinia shares with me that she has a significant bacterial infection which has damaged and continues to damage her tissues.  She shares that the vascular surgeon has expressed with additional intervention she has only a 10% chance of long-term survival. Kayte expresses that she is hopeful despite these odds that she would fare decently.  I asked Donnella what her conversations with her mother had been given the significance of this information. She  shares that her mother is a Engineer, civil (consulting) and does not want to focus on the negative. She wants to place emphasis on the positives. Fallyn however vocalizes awareness in regards to the severity of her situation.   Allowed time for Davanee  to reflect on the difficulty of this information.  Offered support through therapeutic listening.  Reviewed the plan to continue current care allowing time for outcomes.   Discussed the importance of continued conversation with family and their  medical providers regarding overall plan of care and treatment options, ensuring decisions are within the context of the patients values and GOCs. ______________________________ Addendum:  Patients mother, [name] updated by phone. Explained my role to help with symptom management.  Patients mother shares that Sanaz has been in and out of the hospital consistently for her gastroparesis over the past two years. She expresses that [place] has developed a higher opoid tolerance as a result of this.   Patients mother is interested in OP Palliative support on discharge for [facility]. I shared that I would discuss that with [facility] further.   Decision Maker: Patient can make her own decisions though her HCPOA is her mother,  Wynelle Beckmann (Mother): [phone] (Mobile)   SUMMARY OF RECOMMENDATIONS   Full Code / Full scope of care  Open and honest conversations held in the setting of patient's necrotizing fasciitis and recurring amputations  Symptom support as below   Palliative care will continue to follow  Code Status/Advance Care Planning: FULL CODE   Symptom Management:  Acute on chronic pain: - Dilaudid PCA  Dose: Dilaudid Custom-Dose   Loading Dose (in mg): 1   May repeat load q 10 minutes for 3 doses total: No   PCA Patient Bolus Dose (in mg): 0.2   Lockout Interval (in minutes): 10   One Hour Dose Limit (in mg) 1.5   Basal Infusion Rate Yes   Basal Infusion Rate (in mg/hr) 0.5    Palliative Prophylaxis:  Aspiration, Bowel Regimen, Delirium Protocol, Frequent Pain Assessment, Oral Care, Palliative Wound Care, and Turn Reposition  Additional Recommendations (Limitations, Scope, Preferences): Continue current  care  Psycho-social/Spiritual:  Desire for further Chaplaincy support: No patient shares she has her Renato Gails stopping by Additional Recommendations: Ongoing support and education on necrotizing fasciitis   Prognosis: Very poor overall with high likelihood for acute clinical decompensation  Discharge Planning: To be determined  Vitals:   11/16/22 0444 11/16/22 0446  BP:  120/89  Pulse:  95  Resp: 13 12  Temp:  98 F (36.7 C)  SpO2: 100% 100%    Intake/Output Summary (Last 24 hours) at 11/16/2022 5573 Last data filed at 11/16/2022 0449 Gross per 24 hour  Intake 1699.31 ml  Output --  Net 1699.31 ml   Last Weight  Most recent update: 11/12/2022  5:29 AM    Weight  100.1 kg (220 lb 10.9 oz)            Gen:  Young AA F in NAD HEENT: moist mucous membranes CV: Irregular rate and  regular rhythm, no murmurs rubs or gallops PULM:  On 2LPM [place], breathing is even and nonlabored ABD: Swollen, not tender EXT:  (+) edema, (+) dressing Neuro: Alert and oriented x3   PPS: 30%   This conversation/these recommendations were discussed with patient primary care team, Dr. Jerral Ralph  Billing based on MDM: High  Problems Addressed: One acute or chronic illness or injury that poses a threat to life or bodily function  Amount and/or Complexity of Data:  Category 3:Discussion of management or test interpretation with external physician/other qualified health care professional/appropriate source (not separately reported)  Risks: Decision regarding hospitalization or escalation of hospital care and Decision not to resuscitate or to de-escalate care because of poor prognosis ______________________________________________________ Lamarr Lulas [facility] Palliative Medicine Team Team Cell Phone: [phone] Please utilize secure chat with additional questions, if there is no response within 30 minutes please call the above phone number  Palliative Medicine Team providers are available  by phone from 7am to 7pm daily and can be reached through the team cell phone.  Should this patient require assistance outside of these hours, please call the patient's attending physician.

## 2022-11-17 DIAGNOSIS — R52 Pain, unspecified: Secondary | ICD-10-CM

## 2022-11-17 DIAGNOSIS — Z7189 Other specified counseling: Secondary | ICD-10-CM | POA: Diagnosis not present

## 2022-11-17 DIAGNOSIS — M726 Necrotizing fasciitis: Secondary | ICD-10-CM | POA: Diagnosis not present

## 2022-11-17 DIAGNOSIS — Z515 Encounter for palliative care: Secondary | ICD-10-CM | POA: Diagnosis not present

## 2022-11-17 LAB — TYPE AND SCREEN
ABO/RH(D): O POS
Antibody Screen: NEGATIVE
Unit division: 0
Unit division: 0
Unit division: 0
Unit division: 0

## 2022-11-17 LAB — BPAM RBC
Blood Product Expiration Date: 202409302359
Blood Product Expiration Date: 202410262359
Blood Product Expiration Date: 202410262359
Blood Product Expiration Date: 202410272359
ISSUE DATE / TIME: 202409271218
ISSUE DATE / TIME: 202409281537
ISSUE DATE / TIME: 202409290154
ISSUE DATE / TIME: 202409291113
Unit Type and Rh: 202409302359
Unit Type and Rh: 202410262359
Unit Type and Rh: 5100
Unit Type and Rh: 5100
Unit Type and Rh: 5100
Unit Type and Rh: 9500

## 2022-11-17 LAB — CBC
HCT: 25.3 % — ABNORMAL LOW (ref 36.0–46.0)
Hemoglobin: 8.4 g/dL — ABNORMAL LOW (ref 12.0–15.0)
MCH: 30.4 pg (ref 26.0–34.0)
MCHC: 33.2 g/dL (ref 30.0–36.0)
MCV: 91.7 fL (ref 80.0–100.0)
Platelets: 475 10*3/uL — ABNORMAL HIGH (ref 150–400)
RBC: 2.76 MIL/uL — ABNORMAL LOW (ref 3.87–5.11)
RDW: 14.9 % (ref 11.5–15.5)
WBC: 18.4 10*3/uL — ABNORMAL HIGH (ref 4.0–10.5)
nRBC: 0 % (ref 0.0–0.2)

## 2022-11-17 LAB — GLUCOSE, CAPILLARY
Glucose-Capillary: 112 mg/dL — ABNORMAL HIGH (ref 70–99)
Glucose-Capillary: 143 mg/dL — ABNORMAL HIGH (ref 70–99)
Glucose-Capillary: 118 mg/dL — ABNORMAL HIGH (ref 70–99)
Glucose-Capillary: 160 mg/dL — ABNORMAL HIGH (ref 70–99)

## 2022-11-17 LAB — AEROBIC/ANAEROBIC CULTURE W GRAM STAIN (SURGICAL/DEEP WOUND)
Culture: NO GROWTH
Culture: NO GROWTH

## 2022-11-17 LAB — COMPREHENSIVE METABOLIC PANEL
ALT: 20 U/L (ref 0–44)
AST: 28 U/L (ref 15–41)
Albumin: <1.5 g/dL — ABNORMAL LOW (ref 3.5–5.0)
Alkaline Phosphatase: 773 U/L — ABNORMAL HIGH (ref 38–126)
Anion gap: 3 — ABNORMAL LOW (ref 5–15)
BUN: 40 mg/dL — ABNORMAL HIGH (ref 6–20)
CO2: 20 mmol/L — ABNORMAL LOW (ref 22–32)
Calcium: 7.5 mg/dL — ABNORMAL LOW (ref 8.9–10.3)
Chloride: 110 mmol/L (ref 98–111)
Creatinine, Ser: 1.5 mg/dL — ABNORMAL HIGH (ref 0.44–1.00)
GFR, Estimated: 47 mL/min — ABNORMAL LOW (ref >60)
Glucose, Bld: 124 mg/dL — ABNORMAL HIGH (ref 70–99)
Potassium: 5.7 mmol/L — ABNORMAL HIGH (ref 3.5–5.1)
Sodium: 133 mmol/L — ABNORMAL LOW (ref 135–145)
Total Bilirubin: 0.3 mg/dL (ref 0.3–1.2)
Total Protein: 5.1 g/dL — ABNORMAL LOW (ref 6.5–8.1)

## 2022-11-17 MED ORDER — HYDROMORPHONE HCL 1 MG/ML IJ SOLN
0.5000 mg | INTRAMUSCULAR | Status: DC | PRN
  Administered 2022-11-17 – 2022-11-20 (×12): 0.5 mg via INTRAVENOUS
  Filled 2022-11-17 (×14): qty 0.5

## 2022-11-17 MED ORDER — HYDROMORPHONE HCL 2 MG PO TABS
2.0000 mg | ORAL_TABLET | ORAL | Status: DC | PRN
  Administered 2022-11-17 – 2022-11-19 (×8): 2 mg via ORAL
  Filled 2022-11-17 (×8): qty 1

## 2022-11-17 MED ORDER — HYDROMORPHONE HCL 1 MG/ML IJ SOLN: 0.5000 mg | INTRAMUSCULAR | Status: DC | PRN

## 2022-11-17 MED ORDER — FENTANYL 25 MCG/HR TD PT72
1.0000 | MEDICATED_PATCH | TRANSDERMAL | Status: DC
  Administered 2022-11-17: 1 via TRANSDERMAL
  Filled 2022-11-17: qty 1

## 2022-11-17 MED ORDER — HYDROMORPHONE HCL 2 MG PO TABS
2.0000 mg | ORAL_TABLET | ORAL | Status: AC
  Administered 2022-11-17 – 2022-11-18 (×4): 2 mg via ORAL
  Filled 2022-11-17 (×4): qty 1

## 2022-11-17 NOTE — Progress Notes (Addendum)
Palliative Medicine Inpatient Follow Up Note HPI: 32 y.o. female with medical history significant of DM1, diabetic gastroparesis with very frequent admission in the past, HTN presents to the hospital with bilateral foot wounds for about 2 weeks. Not relieved with oral antibiotics. She was admitted and treated with IV antibiotics but ultimately required extensive debridement and subsequent amputations for necrotizing fasciitis. Palliative care has been asked to get involved to assist with goals of care conversations.   Today's Discussion 11/17/2022  *Please note that this is a verbal dictation therefore any spelling or grammatical errors are due to the "Dragon [employer] One" system interpretation.  Chart reviewed inclusive of vital signs, progress notes, laboratory results, and diagnostic images.   I met with Jazymn this morning to do a symptom check. She is still experiencing 7/10 pain with movement though overall feels her pain is improved. She denies nausea, shortness of breath at this time.  _______________________ Addendum:  I met with Dottie and her mother, [name]. We discussed again my role on palliative care.  Palliative care is specialized medical care for people living with a serious illness, such as cancer, heart failure, COPD, Alzheimer's dementia, etc. Patients in palliative care may receive medical care for their symptoms, or palliative care, along with treatment intended to cure their serious illness   Created space and opportunity for the patient and her mother to explore thoughts feelings and fears regarding current medical situation.  [name] expresses how trial some it is to get proper care for [place]. She shares that Laverle has been in and out of the hospital for the past few years due to gastroparesis. She shares that [place] is unable to get proper control of her pain when these events occur and ends up hospitalized as a result of this. On top of that now she is dealing with  her amputations.  [name] expresses concern in the setting of poor communication between providers. She shares her worry in regards to fragmentation of care for [facility].   We discussed the goals of better symptom control.  We further reviewed the plan for discharge and that I would endorse to the MSW concerns related to Malia's disposition. Patients mom shares she will need assistance finding appropriate placement and caregiver options thereafter.   Questions and concerns addressed/Palliative Support Provided.   Objective Assessment: Vital Signs Vitals:   11/17/22 0832 11/17/22 1136  BP: (!) 153/99 (!) 160/100  Pulse: (!) 109 (!) 104  Resp: 17 14  Temp: 98.5 F (36.9 C) 98.3 F (36.8 C)  SpO2: 96% 99%    Intake/Output Summary (Last 24 hours) at 11/17/2022 1334 Last data filed at 11/17/2022 1325 Gross per 24 hour  Intake 338.78 ml  Output 4135 ml  Net -3796.22 ml   Last Weight  Most recent update: 11/12/2022  5:29 AM    Weight  100.1 kg (220 lb 10.9 oz)            Gen:  Young AA F in NAD HEENT: moist mucous membranes CV: Irregular rate and  regular rhythm, no murmurs rubs or gallops PULM:  On 2LPM , breathing is even and nonlabored ABD: Swollen, not tender EXT:  (+) edema, (+) dressing Neuro: Alert and oriented x3   SUMMARY OF RECOMMENDATIONS   Full Code / Full scope of care    Symptom support as below    Palliative care will continue to follow   Code Status/Advance Care Planning: FULL CODE   Symptom Management:  Acute on chronic pain: -  PCA has been DC'd by primary - Will start fentanyl patch at 59mcg/hr - Will add dilaudid 2mg  PO Q4H x4 doses until fentanyl patch reaches steady state - Can continue dilaudid 2mg  PO Q4H severe pain - Can continue dilaudid 0.5mg  IVP Q4H for breakthrough - Continue robaxin 1000mg  PO TID and 500mg  PO/IV Q6H PRN  - Continue Neurontin 300mg  PO Q8H - Continue Cymbalta 30mg  PO Qday  - Referral to Physical Medicine and  Rehabilitation - Dr. Carlis Abbott for ongoing symptom support in the setting of BLE AKAs  Nausea: - Zofran 4mg  IVP Q6H PRN  - Reglan 10mg  PO Q8H - Reglan 5-10mg  PO/IV Q8H PRN  Bowel Ppx: - Bisacodyl 10mg  PO/PR PRN - Patient with loose bowels today will hold off on any standing regiment  Social: - Appreciate MSW support helping patients mother navigate discharge options  Time Spent: 120 Billing based on MDM: High ______________________________________________________________________________________ Lamarr Lulas [place] Palliative Medicine Team Team Cell Phone: [phone] Please utilize secure chat with additional questions, if there is no response within 30 minutes please call the above phone number  Palliative Medicine Team providers are available by phone from 7am to 7pm daily and can be reached through the team cell phone.  Should this patient require assistance outside of these hours, please call the patient's attending physician.

## 2022-11-17 NOTE — Progress Notes (Signed)
Inpatient Rehab Admissions Coordinator:    Per request of palliative MD, I spoke with pt.'s mother regarding potential CIR admit. Mother is not able to be home with Pt. At d/c and states no one else can provide that level of care at d/c either. Insurance will not cover SNF after CIR, so recommend she go directly to SNF. I will reach out to [facility].  Megan Salon, MS, CCC-SLP Rehab Admissions Coordinator  [phone] (celll) [phone] (office)

## 2022-11-17 NOTE — Progress Notes (Signed)
Patient ID: Kelli Hudson, female   DOB: 1990/03/24, 32 y.o.   MRN: [id number] Patient is status post bilateral above-knee amputations.  Her hemoglobin is stable and her white cell count is dropped to 18.4.  Recommended she continue with protein supplements with Juven or other protein drinks.  Tissue margins were clear at surgery and anticipate patient can discontinue her IV antibiotics.

## 2022-11-17 NOTE — Progress Notes (Signed)
PROGRESS NOTE    Kelli Hudson  BMW:[id number] DOB: 06-12-1990 DOA: 10/03/2022 PCP: Norm Salt, PA    Brief Narrative:  32 y.o. female with medical history significant of DM1, diabetic gastroparesis with very frequent admissions in the past, HTN presents to the hospital with bilateral foot wounds for about 2 weeks.  Not relieved with oral antibiotics.  She was admitted and treated with IV antibiotics but ultimately required extensive debridement and subsequent amputations for necrotizing fasciitis.   Chronology of events as below.  Since her surgery intermittently having anemia without any obvious signs of blood loss requiring PRBC transfusion, WBC still remains elevated but stable. Repeat CT showed persistent fluid collection, s/p OR 9/18, and again on 9/20 for repeat debridement.  Polymicrobial infection. 9/25, back to the OR for more debridement.  Nonviable tissue on below-knee amputation stumps and thigh wounds. 9/27, back to OR for revision. Remains in the hospital, postop recovery, recurrent anemia.  Currently pain managed with Dilaudid PCA. 9/30, some clinical stabilization now.  WBC trending down.    Assessment & Plan:   Principal Problem:   Necrotizing fasciitis (HCC) Active Problems:   AKI (acute kidney injury) (HCC)   Sepsis due to cellulitis (HCC)   Cellulitis of lower extremity   Anemia   Diabetic gastroparesis associated with type 1 diabetes mellitus (HCC)   DM type 1 (diabetes mellitus, type 1) (HCC)   Drug-seeking behavior   Sepsis (HCC)   Necrotizing fasciitis of pelvic region and thigh (HCC)   Necrotizing fasciitis of lower leg (HCC)   MDD (major depressive disorder), recurrent episode, moderate (HCC)   Severe sepsis due to bilateral leg abscess, necrotizing fasciitis, POA.  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 -I&D 8/21 -Bilateral BKA on 9/6 -Back to OR and multiple debridements 9/18, 9/19, 9/25.   Amputation revisions. -9/27, revision and bilateral above-knee amputations. 9/18, culture with steno and Klebsiella, MDR ESBL and rare yeast 9/20, surgical culture with Klebsiella and steno.   Surgical margins reported to be clear after bilateral above-knee amputation.  Discontinued all antibiotics and monitoring. Patient has severe recurrent pain, managed with Dilaudid PCA.  Will change that to Dilaudid injection 0.5 every 4 hours, Dilaudid 2 mg oral every 4 hours. Hopefully she can start mobilizing.  Hypotension with tachycardia Stable today. continuing Midodrine 5 mg 3 times daily.  Acute blood loss anemia due to operative blood loss Patient received total of 17 units of blood transfusions.  Hemoglobin is stable now.   Type 1 diabetes, poorly controlled with intermittent hypoglycemia Blood sugars are controlled on long-acting insulin 4 units twice daily, sliding scale insulin.  Avoid hypoglycemia.     Acute kidney injury on CKD stage IIIa due to septic ATN, improving Non-anion gap metabolic acidosis, hyperkalemia. Renal functions slightly fluctuates but more or less stable.  Potassium was 6.4, temporizing measures and Lokelma brought down to 5.7.  Will continue Lokelma every day. Monitor closely.   Severe protein calorie malnutrition Encouraging oral diet, dietitian following.  Encourage oral diet Fortunately, she does not have gastroparesis symptoms these days and able to eat regular diet.  Occasionally using nausea medicine.   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  Currently not having active symptoms.  Occasionally using nausea medicine.   Hypertension IV as needed   Depression -On Cymbalta at home, continued    DVT prophylaxis: heparin   Code Status: Full Code  Family Communication: None today.  Status is: Inpatient Remains inpatient appropriate because: Immediate postop.  On IV antibiotics.  Subjective:  Patient seen and examined.  She declined to take  [facility] earlier today.  She overall feels better.  We discussed about taking her off PCA and putting her on oral and injectables which she agreed.  Denies any nausea vomiting.  Examination:  General: Chronically sick looking but currently comfortable.  She has a flat affect.  Denies any complaints. Cardiovascular: S1-S2 normal.  Regular rate rhythm. Respiratory: Bilateral clear.  No added sounds. Gastrointestinal: Soft.  Nontender.  Bowel sound present.  She does have slight abdominal wall edema but improving.  Patient has gastric pacemaker left lateral quadrant. Ext: Lower extremities on bulky bandages.  Patient has subcutaneous swelling and edema extending up to the lower abdominal wall. Picc line right arm.  Mild swelling present.  Nontender. Neuro: Alert and awake.       Diet Orders (From admission, onward)     Start     Ordered   11/14/22 1347  Diet Carb Modified Fluid consistency: Thin; Room service appropriate? Yes  Diet effective now       Question Answer Comment  Calorie Level Medium 1600-2000   Fluid consistency: Thin   Room service appropriate? Yes      11/14/22 1346            Objective: Vitals:   11/17/22 0518 11/17/22 0759 11/17/22 0832 11/17/22 1136  BP:   (!) 153/99 (!) 160/100  Pulse:   (!) 109 (!) 104  Resp: 12 (!) 8 17 14   Temp:   98.5 F (36.9 C) 98.3 F (36.8 C)  TempSrc:   Oral Oral  SpO2: 100% 99% 96% 99%  Weight:      Height:        Intake/Output Summary (Last 24 hours) at 11/17/2022 1304 Last data filed at 11/17/2022 1610 Gross per 24 hour  Intake 338.78 ml  Output 2350 ml  Net -2011.22 ml   Filed Weights   11/10/22 0500 11/11/22 0331 11/12/22 0313  Weight: 101.8 kg 100 kg 100.1 kg    Scheduled Meds:  sodium chloride   Intravenous Once   acetaminophen  1,000 mg Oral Q6H   vitamin C  1,000 mg Oral Daily   Chlorhexidine Gluconate Cloth  6 each Topical Daily   dextrose  50 mL Intravenous Once   docusate sodium  100 mg Oral Daily    DULoxetine  30 mg Oral Daily   feeding supplement (GLUCERNA SHAKE)  237 mL Oral TID BM   fluconazole  400 mg Oral Daily   gabapentin  300 mg Oral Q8H   heparin injection (subcutaneous)  5,000 Units Subcutaneous Q8H   hydrocortisone cream   Topical TID   insulin aspart  0-5 Units Subcutaneous QHS   insulin aspart  0-6 Units Subcutaneous TID WC   insulin glargine-yfgn  4 Units Subcutaneous BID   methocarbamol  1,000 mg Oral TID   metoCLOPramide  10 mg Oral Q8H   metoprolol tartrate  25 mg Oral BID   midodrine  5 mg Oral TID WC   minocycline  200 mg Oral BID   multivitamin with minerals  1 tablet Oral Daily   nutrition supplement (JUVEN)  1 packet Oral BID BM   omeprazole  40 mg Oral Daily   pantoprazole  40 mg Oral Daily   silver nitrate applicators  1 Application Topical Once   sodium chloride flush  10-40 mL Intracatheter Q12H   sodium zirconium cyclosilicate  10 g Oral Daily   zinc sulfate  220 mg Oral Daily   Continuous Infusions:  sodium chloride 10 mL/hr at 11/13/22 1745   sodium chloride 20 mL/hr at 11/13/22 1745   sodium chloride     magnesium sulfate bolus IVPB     meropenem (MERREM) IV 1 g (11/17/22 0537)   methocarbamol (ROBAXIN) IV Stopped (11/13/22 0617)    Nutritional status Signs/Symptoms: estimated needs Interventions: Juven, Glucerna shake Body mass index is 34.56 kg/m.  Data Reviewed:   CBC: Recent Labs  Lab 11/13/22 0600 11/13/22 2115 11/14/22 0500 11/15/22 0530 11/15/22 2215 11/16/22 0752 11/16/22 1740 11/17/22 0520  WBC 31.2*  --  29.4* 33.8*  --  23.7*  --  18.4*  HGB 6.2*   < > 8.4* 6.6* 6.7* 7.9* 8.6* 8.4*  HCT 19.4*   < > 25.4* 20.2* 19.7* 23.8* 25.5* 25.3*  MCV 94.6  --  90.7 94.0  --  91.9  --  91.7  PLT 544*  --  560* 507*  --  461*  --  475*   < > = values in this interval not displayed.   Basic Metabolic Panel: Recent Labs  Lab 11/13/22 0600 11/14/22 0712 11/15/22 0530 11/15/22 2215 11/16/22 0752 11/17/22 0520  NA 130*  132* 127*  --  130* 133*  K 5.7* 5.5* 6.4* 6.3* 5.6* 5.7*  CL 108 107 105  --  104 110  CO2 18* 19* 16*  --  19* 20*  GLUCOSE 195* 96 381*  --  82 124*  BUN 32* 30* 40*  --  49* 40*  CREATININE 1.60* 1.37* 1.97*  --  1.70* 1.50*  CALCIUM 6.7* 7.4* 7.2*  --  7.5* 7.5*   GFR: Estimated Creatinine Clearance: 66.1 mL/min (A) (by C-G formula based on SCr of 1.5 mg/dL (H)). Liver Function Tests: Recent Labs  Lab 11/17/22 0520  AST 28  ALT 20  ALKPHOS 773*  BILITOT 0.3  PROT 5.1*  ALBUMIN <1.5*   No results for input(s): "LIPASE", "AMYLASE" in the last 168 hours. No results for input(s): "AMMONIA" in the last 168 hours. Coagulation Profile: No results for input(s): "INR", "PROTIME" in the last 168 hours. Cardiac Enzymes: No results for input(s): "CKTOTAL", "CKMB", "CKMBINDEX", "TROPONINI" in the last 168 hours. BNP (last 3 results) No results for input(s): "PROBNP" in the last 8760 hours. HbA1C: No results for input(s): "HGBA1C" in the last 72 hours. CBG: Recent Labs  Lab 11/16/22 1121 11/16/22 1707 11/16/22 2153 11/17/22 0614 11/17/22 1142  GLUCAP 127* 150* 131* 112* 143*   Lipid Profile: No results for input(s): "CHOL", "HDL", "LDLCALC", "TRIG", "CHOLHDL", "LDLDIRECT" in the last 72 hours. Thyroid Function Tests: No results for input(s): "TSH", "T4TOTAL", "FREET4", "T3FREE", "THYROIDAB" in the last 72 hours. Anemia Panel: No results for input(s): "VITAMINB12", "FOLATE", "FERRITIN", "TIBC", "IRON", "RETICCTPCT" in the last 72 hours. Sepsis Labs: No results for input(s): "PROCALCITON", "LATICACIDVEN" in the last 168 hours.  Recent Results (from the past 240 hour(s))  Aerobic/Anaerobic Culture w Gram Stain (surgical/deep wound)     Status: None (Preliminary result)   Collection Time: 11/12/22  4:46 PM   Specimen: Soft Tissue, Other  Result Value Ref Range Status   Specimen Description TISSUE  Final   Special Requests LEFT THIGH WD  Final   Gram Stain   Final    FEW  WBC PRESENT,BOTH PMN AND MONONUCLEAR NO ORGANISMS SEEN    Culture   Final    NO GROWTH 4 DAYS NO ANAEROBES ISOLATED; CULTURE IN PROGRESS FOR 5 DAYS Performed at  [facility], [address], [place], [place] [address]    Report Status PENDING  Incomplete  Aerobic/Anaerobic Culture w Gram Stain (surgical/deep wound)     Status: None (Preliminary result)   Collection Time: 11/12/22  4:52 PM   Specimen: Soft Tissue, Other  Result Value Ref Range Status   Specimen Description TISSUE  Final   Special Requests RIGHT THIGH WD SPEC B  Final   Gram Stain   Final    FEW WBC PRESENT,BOTH PMN AND MONONUCLEAR NO ORGANISMS SEEN    Culture   Final    NO GROWTH 4 DAYS NO ANAEROBES ISOLATED; CULTURE IN PROGRESS FOR 5 DAYS Performed at [facility], [address], [place], [place] [address]    Report Status PENDING  Incomplete         Radiology Studies: No results found.         LOS: 45 days   Time spent= 35 mins

## 2022-11-17 NOTE — Plan of Care (Signed)
  Problem: Clinical Measurements: Goal: Will remain free from infection Outcome: Not Progressing   Problem: Activity: Goal: Risk for activity intolerance will decrease Outcome: Not Progressing   

## 2022-11-18 DIAGNOSIS — Z515 Encounter for palliative care: Secondary | ICD-10-CM | POA: Diagnosis not present

## 2022-11-18 DIAGNOSIS — M726 Necrotizing fasciitis: Secondary | ICD-10-CM | POA: Diagnosis not present

## 2022-11-18 DIAGNOSIS — Z7189 Other specified counseling: Secondary | ICD-10-CM | POA: Diagnosis not present

## 2022-11-18 DIAGNOSIS — R52 Pain, unspecified: Secondary | ICD-10-CM | POA: Diagnosis not present

## 2022-11-18 LAB — HEMOGLOBIN A1C
Hgb A1c MFr Bld: 5.9 % — ABNORMAL HIGH (ref 4.8–5.6)
Mean Plasma Glucose: 122.63 mg/dL

## 2022-11-18 LAB — BASIC METABOLIC PANEL
Anion gap: 4 — ABNORMAL LOW (ref 5–15)
BUN: 31 mg/dL — ABNORMAL HIGH (ref 6–20)
CO2: 20 mmol/L — ABNORMAL LOW (ref 22–32)
Calcium: 7.7 mg/dL — ABNORMAL LOW (ref 8.9–10.3)
Chloride: 109 mmol/L (ref 98–111)
Creatinine, Ser: 1.07 mg/dL — ABNORMAL HIGH (ref 0.44–1.00)
GFR, Estimated: >60 mL/min (ref >60)
Glucose, Bld: 107 mg/dL — ABNORMAL HIGH (ref 70–99)
Potassium: 5.7 mmol/L — ABNORMAL HIGH (ref 3.5–5.1)
Sodium: 133 mmol/L — ABNORMAL LOW (ref 135–145)

## 2022-11-18 LAB — CBC WITH DIFFERENTIAL/PLATELET
Abs Immature Granulocytes: 0.24 10*3/uL — ABNORMAL HIGH (ref 0.00–0.07)
Basophils Absolute: 0.1 10*3/uL (ref 0.0–0.1)
Basophils Relative: 1 %
Eosinophils Absolute: 0.3 10*3/uL (ref 0.0–0.5)
Eosinophils Relative: 2 %
HCT: 24.9 % — ABNORMAL LOW (ref 36.0–46.0)
Hemoglobin: 8.2 g/dL — ABNORMAL LOW (ref 12.0–15.0)
Immature Granulocytes: 1 %
Lymphocytes Relative: 20 %
Lymphs Abs: 4 10*3/uL (ref 0.7–4.0)
MCH: 30.4 pg (ref 26.0–34.0)
MCHC: 32.9 g/dL (ref 30.0–36.0)
MCV: 92.2 fL (ref 80.0–100.0)
Monocytes Absolute: 1.1 10*3/uL — ABNORMAL HIGH (ref 0.1–1.0)
Monocytes Relative: 6 %
Neutro Abs: 13.7 10*3/uL — ABNORMAL HIGH (ref 1.7–7.7)
Neutrophils Relative %: 70 %
Platelets: 526 10*3/uL — ABNORMAL HIGH (ref 150–400)
RBC: 2.7 MIL/uL — ABNORMAL LOW (ref 3.87–5.11)
RDW: 14.4 % (ref 11.5–15.5)
WBC: 19.5 10*3/uL — ABNORMAL HIGH (ref 4.0–10.5)
nRBC: 0 % (ref 0.0–0.2)

## 2022-11-18 LAB — GLUCOSE, CAPILLARY
Glucose-Capillary: 125 mg/dL — ABNORMAL HIGH (ref 70–99)
Glucose-Capillary: 181 mg/dL — ABNORMAL HIGH (ref 70–99)
Glucose-Capillary: 147 mg/dL — ABNORMAL HIGH (ref 70–99)
Glucose-Capillary: 160 mg/dL — ABNORMAL HIGH (ref 70–99)

## 2022-11-18 NOTE — Plan of Care (Signed)

## 2022-11-18 NOTE — Progress Notes (Signed)
PROGRESS NOTE    Kelli Hudson  ZOX:[id number] DOB: 05/04/1990 DOA: 10/03/2022 PCP: Norm Salt, PA    Brief Narrative:  32 y.o. female with medical history significant of DM1, diabetic gastroparesis with very frequent admissions in the past, HTN presents to the hospital with bilateral foot wounds for about 2 weeks.  Not relieved with oral antibiotics.  She was admitted and treated with IV antibiotics but ultimately required extensive debridement and subsequent amputations for necrotizing fasciitis.   Chronology of events as below.  Since her surgery intermittently having anemia without any obvious signs of blood loss requiring PRBC transfusion, WBC still remains elevated but stable. Repeat CT showed persistent fluid collection, s/p OR 9/18, and again on 9/20 for repeat debridement.  Polymicrobial infection. 9/25, back to the OR for more debridement.  Nonviable tissue on below-knee amputation stumps and thigh wounds. 9/27, back to OR for revision. Remains in the hospital, postop recovery, recurrent anemia.  Currently pain managed with Dilaudid PCA. 9/30, some clinical stabilization now.  WBC trending down.    Assessment & Plan:   Principal Problem:   Necrotizing fasciitis (HCC) Active Problems:   AKI (acute kidney injury) (HCC)   Sepsis due to cellulitis (HCC)   Cellulitis of lower extremity   Anemia   Diabetic gastroparesis associated with type 1 diabetes mellitus (HCC)   DM type 1 (diabetes mellitus, type 1) (HCC)   Drug-seeking behavior   Sepsis (HCC)   Necrotizing fasciitis of pelvic region and thigh (HCC)   Necrotizing fasciitis of lower leg (HCC)   MDD (major depressive disorder), recurrent episode, moderate (HCC)   Severe sepsis due to bilateral leg abscess, necrotizing fasciitis, POA.  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 -I&D 8/21 -Bilateral BKA on 9/6 -Back to OR and multiple debridements 9/18, 9/19, 9/25.   Amputation revisions. -9/27, revision and bilateral above-knee amputations. 9/18, culture with steno and Klebsiella, MDR ESBL and rare yeast 9/20, surgical culture with Klebsiella and steno.   Surgical margins reported to be clear after bilateral above-knee amputation.  Discontinued all antibiotics and monitoring. Patient has severe recurrent pain, was managed with Dilaudid PCA. will change that to Dilaudid injection 0.5 every 4 hours, Dilaudid 2 mg oral every 4 hours.  Started on fentanyl 25 mcg/h patch.  Appreciate palliative care follow-up. Start mobilizing.  PT OT.  Refer to SNF for rehab.  Hypotension with tachycardia Stable today.  Blood pressures are adequate.  Will discontinue midodrine.  Acute blood loss anemia due to operative blood loss Patient received total of 17 units of blood transfusions.  Hemoglobin is stable now.  Will check frequently.   Type 1 diabetes, poorly controlled with intermittent hypoglycemia Blood sugars are controlled on long-acting insulin 4 units twice daily, sliding scale insulin.  Avoid hypoglycemia.     Acute kidney injury on CKD stage IIIa due to septic ATN, improving Non-anion gap metabolic acidosis, hyperkalemia. Renal functions slightly fluctuates but more or less stable.  Renal functions trending down.   Potassium 5.7.  On Lokelma daily.  Monitor closely.     Severe protein calorie malnutrition Encouraging oral diet, dietitian following.  Encourage oral diet Fortunately, she does not have gastroparesis symptoms these days and able to eat regular diet.  Occasionally using nausea medicine.   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  Currently not having active symptoms.  Occasionally using nausea medicine.   Hypertension IV as needed   Depression -On Cymbalta at home, continued    DVT prophylaxis: heparin  Code Status: Full Code  Family Communication: None today.  Palliative care team communicated with mother.  Status is:  Inpatient Remains inpatient appropriate because: Immediate postop.  Needs SNF.  Subjective:  Patient seen and examined.  Though overnight she complained about excess pain, on my interview patient tells me she is doing fairly well.  She was happy with fentanyl patch.  I discussed with her that next few days she will come off IV Dilaudid altogether and stay on fentanyl patch and oral Dilaudid until rehab.  She is agreeable.  Examination:  General: Looks fairly comfortable.  Interactive.  In normal mood. Cardiovascular: S1-S2 normal.  Regular rate rhythm. Respiratory: Bilateral clear.  No added sounds. Gastrointestinal: Soft.  Nontender.  Bowel sound present.  She does have slight abdominal wall edema but improving.  Patient has gastric pacemaker left lateral quadrant. Ext: Bilateral above-knee amputation stump with some wound dehiscence, clear with clean margin as per surgery. Picc line right arm.  Mild swelling present.  Nontender. Neuro: Alert and awake.       Diet Orders (From admission, onward)     Start     Ordered   11/14/22 1347  Diet Carb Modified Fluid consistency: Thin; Room service appropriate? Yes  Diet effective now       Question Answer Comment  Calorie Level Medium 1600-2000   Fluid consistency: Thin   Room service appropriate? Yes      11/14/22 1346            Objective: Vitals:   11/17/22 2334 11/18/22 0549 11/18/22 0805 11/18/22 1141  BP: (!) 144/84 (!) 153/89 (!) 164/98 (!) 158/94  Pulse: 94 88 89 99  Resp: 11 14 19 19   Temp: 97.9 F (36.6 C) 98.4 F (36.9 C) 98 F (36.7 C) 98.3 F (36.8 C)  TempSrc: Oral Oral Oral Oral  SpO2: 95% 99% 99% 100%  Weight:      Height:        Intake/Output Summary (Last 24 hours) at 11/18/2022 1308 Last data filed at 11/18/2022 1252 Gross per 24 hour  Intake --  Output 6985 ml  Net -6985 ml   Filed Weights   11/10/22 0500 11/11/22 0331 11/12/22 0313  Weight: 101.8 kg 100 kg 100.1 kg    Scheduled Meds:   sodium chloride   Intravenous Once   acetaminophen  1,000 mg Oral Q6H   vitamin C  1,000 mg Oral Daily   Chlorhexidine Gluconate Cloth  6 each Topical Daily   dextrose  50 mL Intravenous Once   docusate sodium  100 mg Oral Daily   DULoxetine  30 mg Oral Daily   feeding supplement (GLUCERNA SHAKE)  237 mL Oral TID BM   fentaNYL  1 patch Transdermal Q72H   gabapentin  300 mg Oral Q8H   heparin injection (subcutaneous)  5,000 Units Subcutaneous Q8H   hydrocortisone cream   Topical TID   insulin aspart  0-5 Units Subcutaneous QHS   insulin aspart  0-6 Units Subcutaneous TID WC   insulin glargine-yfgn  4 Units Subcutaneous BID   methocarbamol  1,000 mg Oral TID   metoCLOPramide  10 mg Oral Q8H   metoprolol tartrate  25 mg Oral BID   multivitamin with minerals  1 tablet Oral Daily   nutrition supplement (JUVEN)  1 packet Oral BID BM   omeprazole  40 mg Oral Daily   pantoprazole  40 mg Oral Daily   silver nitrate applicators  1 Application Topical Once  sodium chloride flush  10-40 mL Intracatheter Q12H   sodium zirconium cyclosilicate  10 g Oral Daily   zinc sulfate  220 mg Oral Daily   Continuous Infusions:  sodium chloride 10 mL/hr at 11/13/22 1745   sodium chloride 20 mL/hr at 11/13/22 1745   sodium chloride     magnesium sulfate bolus IVPB     methocarbamol (ROBAXIN) IV Stopped (11/13/22 0617)    Nutritional status Signs/Symptoms: estimated needs Interventions: Juven, Glucerna shake Body mass index is 34.56 kg/m.  Data Reviewed:   CBC: Recent Labs  Lab 11/14/22 0500 11/15/22 0530 11/15/22 2215 11/16/22 0752 11/16/22 1740 11/17/22 0520 11/18/22 0750  WBC 29.4* 33.8*  --  23.7*  --  18.4* 19.5*  NEUTROABS  --   --   --   --   --   --  13.7*  HGB 8.4* 6.6* 6.7* 7.9* 8.6* 8.4* 8.2*  HCT 25.4* 20.2* 19.7* 23.8* 25.5* 25.3* 24.9*  MCV 90.7 94.0  --  91.9  --  91.7 92.2  PLT 560* 507*  --  461*  --  475* 526*   Basic Metabolic Panel: Recent Labs  Lab  11/14/22 0712 11/15/22 0530 11/15/22 2215 11/16/22 0752 11/17/22 0520 11/18/22 0750  NA 132* 127*  --  130* 133* 133*  K 5.5* 6.4* 6.3* 5.6* 5.7* 5.7*  CL 107 105  --  104 110 109  CO2 19* 16*  --  19* 20* 20*  GLUCOSE 96 381*  --  82 124* 107*  BUN 30* 40*  --  49* 40* 31*  CREATININE 1.37* 1.97*  --  1.70* 1.50* 1.07*  CALCIUM 7.4* 7.2*  --  7.5* 7.5* 7.7*   GFR: Estimated Creatinine Clearance: 92.6 mL/min (A) (by C-G formula based on SCr of 1.07 mg/dL (H)). Liver Function Tests: Recent Labs  Lab 11/17/22 0520  AST 28  ALT 20  ALKPHOS 773*  BILITOT 0.3  PROT 5.1*  ALBUMIN <1.5*   No results for input(s): "LIPASE", "AMYLASE" in the last 168 hours. No results for input(s): "AMMONIA" in the last 168 hours. Coagulation Profile: No results for input(s): "INR", "PROTIME" in the last 168 hours. Cardiac Enzymes: No results for input(s): "CKTOTAL", "CKMB", "CKMBINDEX", "TROPONINI" in the last 168 hours. BNP (last 3 results) No results for input(s): "PROBNP" in the last 8760 hours. HbA1C: Recent Labs    11/18/22 0750  HGBA1C 5.9*   CBG: Recent Labs  Lab 11/17/22 1142 11/17/22 1641 11/17/22 2119 11/18/22 0545 11/18/22 1143  GLUCAP 143* 118* 160* 125* 181*   Lipid Profile: No results for input(s): "CHOL", "HDL", "LDLCALC", "TRIG", "CHOLHDL", "LDLDIRECT" in the last 72 hours. Thyroid Function Tests: No results for input(s): "TSH", "T4TOTAL", "FREET4", "T3FREE", "THYROIDAB" in the last 72 hours. Anemia Panel: No results for input(s): "VITAMINB12", "FOLATE", "FERRITIN", "TIBC", "IRON", "RETICCTPCT" in the last 72 hours. Sepsis Labs: No results for input(s): "PROCALCITON", "LATICACIDVEN" in the last 168 hours.  Recent Results (from the past 240 hour(s))  Aerobic/Anaerobic Culture w Gram Stain (surgical/deep wound)     Status: None   Collection Time: 11/12/22  4:46 PM   Specimen: Soft Tissue, Other  Result Value Ref Range Status   Specimen Description TISSUE  Final    Special Requests LEFT THIGH WD  Final   Gram Stain   Final    FEW WBC PRESENT,BOTH PMN AND MONONUCLEAR NO ORGANISMS SEEN    Culture   Final    No growth aerobically or anaerobically. Performed at [facility]  Lab, [address] [place], [place] [address]    Report Status 11/17/2022 FINAL  Final  Aerobic/Anaerobic Culture w Gram Stain (surgical/deep wound)     Status: None   Collection Time: 11/12/22  4:52 PM   Specimen: Soft Tissue, Other  Result Value Ref Range Status   Specimen Description TISSUE  Final   Special Requests RIGHT THIGH WD SPEC B  Final   Gram Stain   Final    FEW WBC PRESENT,BOTH PMN AND MONONUCLEAR NO ORGANISMS SEEN    Culture   Final    No growth aerobically or anaerobically. Performed at [facility] Lab, [address] [place], [place] [address]    Report Status 11/17/2022 FINAL  Final         Radiology Studies: No results found.         LOS: 46 days   Time spent= 35 mins

## 2022-11-18 NOTE — Progress Notes (Signed)
I contacted portable equipment regarding the trapeze ordered; still waiting on [employer] to deliver the equipment directly to the unit.

## 2022-11-18 NOTE — Progress Notes (Addendum)
Palliative Medicine Inpatient Follow Up Note HPI: 32 y.o. female with medical history significant of DM1, diabetic gastroparesis with very frequent admission in the past, HTN presents to the hospital with bilateral foot wounds for about 2 weeks. Not relieved with oral antibiotics. She was admitted and treated with IV antibiotics but ultimately required extensive debridement and subsequent amputations for necrotizing fasciitis. Palliative care has been asked to get involved to assist with goals of care conversations.   Today's Discussion 11/18/2022  *Please note that this is a verbal dictation therefore any spelling or grammatical errors are due to the "[employer]agon [employer] One" system interpretation.  Chart reviewed inclusive of vital signs, progress notes, laboratory results, and diagnostic images.   I spoke to patients RN, Kelli Hudson who shares patient remains to have ongoing pain.   I met with Kelli Hudson this morning we reviewed that she still has pain though she feels it is tolerable. She shares it is all over and identifies it as a 7-8/10. Kelli Hudson shares that her ideal pain level would be a 6/10 as she anticipates living with chronic pain. We discussed the addition of the TD fentanyl and the anticipation of improvement over the next 24 hours. We discussed medicine for breakthrough pain with dilaudid.   Kelli Hudson denies nausea this morning. We discussed that it comes on from no where and yesterday it may have been an emotional response to the conversation which was being held.  Kelli Hudson shares that she is not feeling short of breath. She is no longer in need of supplemental O2.   We discussed patients disposition options briefly which I shared will be managed by the [facility] team.   Goals for the day include working on pain management.  __________________ Addendum:  I called patients mother, Kelli Hudson and updated her to the above.   Questions and concerns addressed/Palliative Support Provided.   Objective  Assessment: Vital Signs Vitals:   11/18/22 0549 11/18/22 0805  BP: (!) 153/89 (!) 164/98  Pulse: 88 89  Resp: 14 19  Temp: 98.4 F (36.9 C) 98 F (36.7 C)  SpO2: 99% 99%    Intake/Output Summary (Last 24 hours) at 11/18/2022 1042 Last data filed at 11/18/2022 0820 Gross per 24 hour  Intake --  Output 5860 ml  Net -5860 ml   Last Weight  Most recent update: 11/12/2022  5:29 AM    Weight  100.1 kg (220 lb 10.9 oz)            Gen:  Young AA F in NAD HEENT: moist mucous membranes CV: Irregular rate and  regular rhythm, no murmurs rubs or gallops PULM:  On RA, breathing is even and nonlabored ABD: Swollen, not tender EXT:  (+) edema, (+) dressing Neuro: Alert and oriented x3   SUMMARY OF RECOMMENDATIONS   Full Code / Full scope of care    Symptom support as below    Palliative care will continue to follow   Code Status/Advance Care Planning: FULL CODE   Symptom Management:  Acute on chronic pain: - PCA has been DC'd by primary - Will start fentanyl patch at 45mcg/hr - Can continue dilaudid 2mg  PO Q4H severe pain - Can continue dilaudid 0.5mg  IVP Q4H for breakthrough - Continue robaxin 1000mg  PO TID and 500mg  PO/IV Q6H PRN  - Continue Neurontin 300mg  PO Q8H - Continue Cymbalta 30mg  PO Qday  - Referral to Physical Medicine and Rehabilitation - Dr. Carlis Abbott for ongoing symptom support in the setting of BLE AKAs  Nausea: -  Zofran 4mg  IVP Q6H PRN  - Reglan 10mg  PO Q8H - Reglan 5-10mg  PO/IV Q8H PRN  Bowel Ppx: - Bisacodyl 10mg  PO/PR PRN  Social: - Appreciate MSW support helping patients mother navigate discharge options  Time: 58 Billing based on MDM: High ______________________________________________________________________________________ Lamarr Lulas  Palliative Medicine Team Team Cell Phone: [phone] Please utilize secure chat with additional questions, if there is no response within 30 minutes please call the above phone  number  Palliative Medicine Team providers are available by phone from 7am to 7pm daily and can be reached through the team cell phone.  Should this patient require assistance outside of these hours, please call the patient's attending physician.

## 2022-11-18 NOTE — Progress Notes (Signed)
Patient ID: Kelli Hudson, female   DOB: 13-Feb-1991, 32 y.o.   MRN: [id number] Patient is status post bilateral above-knee amputations.  No new labs from yesterday.  Patient states she is having difficulty mobilizing with therapy but is working hard and is drinking protein supplements.

## 2022-11-18 NOTE — Progress Notes (Signed)
Physical Therapy Treatment Patient Details Name: Kelli Hudson MRN: [id number] DOB: [birth year] Today's Date: 11/18/2022   History of Present Illness Patient is 32 y.o. female presented to hospital 10/03/22 for bil foot wounds, fever, and pain for ~2 weeks with no improvement with antibiotics. Pt admitted for sepsis with necrotizing fasciitis/gangrene bilateral feet elected for surgical management. Pt underwent excision and debridement of BLE extending feet to thighs on 10/08/22 and remained intubated in ICU. 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 PMH significant for DM, HTN, gastric pacemaker.    PT Comments  Pt is making good progress towards her goals today. She looks the best she has in weeks and is highly motivated. Pt is able to roll onto her sides with min A and perform hip exercises and hip flexor stretching. Then with HoB elevated pt able to pull herself into long sitting with minAx2 and on second attempt able to perform at a contact guard level. Will work to progress to OOB in wheelchair in coming therapy sessions. D/c plan remains appropriate. PT will continue to follow acutely.     If plan is discharge home, recommend the following: Two people to help with walking and/or transfers;Two people to help with bathing/dressing/bathroom;Assistance with cooking/housework;Assist for transportation;Help with stairs or ramp for entrance;Supervision due to cognitive status   Can travel by private vehicle     No  Equipment Recommendations  Wheelchair (measurements PT);Wheelchair cushion (measurements PT)       Precautions / Restrictions Precautions Precautions: Fall Restrictions Weight Bearing Restrictions: Yes RLE Weight Bearing: Non weight bearing LLE Weight Bearing: Non weight bearing Other Position/Activity Restrictions: B BKA on 9/6 with B AKA revision on 9/27     Mobility  Bed Mobility Overal bed mobility: Needs Assistance Bed Mobility:  Rolling Rolling: Min assist, Used rails, Contact guard assist   Supine to sit: Min assist, +2 for physical assistance, Used rails, HOB elevated, Contact guard Sit to supine: Contact guard assist   General bed mobility comments: light min A to CGA for rolling onto R and L hip for LE exercises, minAx2 for coming to long sitting in bed with HoB elevated and pt use of bedrails, pt able to lower her self to bed surface with eccentric core control, on second bout of sitting pt able to achieve long sitting with use of bed rails and CGA                       Balance Overall balance assessment: Needs assistance Sitting-balance support: No upper extremity supported Sitting balance-Leahy Scale: Fair Sitting balance - Comments: able to long sit without UE in center of bed                                    Cognition Arousal: Alert Behavior During Therapy: WFL for tasks assessed/performed Overall Cognitive Status: Within Functional Limits for tasks assessed                                          Exercises Amputee Exercises Hip Extension: AROM, 5 reps, Sidelying, Both, PROM (hip flexor stretch 30 sec hold x3) Hip ABduction/ADduction: AROM, 5 reps, Sidelying, Both    General Comments General comments (skin integrity, edema, etc.): pt with difficulty moving on air mattress, messaged  MD about moving back to standard bed with trapeze, also inquired about different dressing possibly shrinkers as Ace wraps not staying in place with mobilization      Pertinent Vitals/Pain Pain Assessment Pain Assessment: Faces Faces Pain Scale: Hurts little more Pain Location: B LE Pain Descriptors / Indicators: Aching, Grimacing, Guarding, Sore Pain Intervention(s): Limited activity within patient's tolerance, Monitored during session, Premedicated before session, Repositioned     PT Goals (current goals can now be found in the care plan section) Acute Rehab PT  Goals PT Goal Formulation: With patient Time For Goal Achievement: 11/29/22 Potential to Achieve Goals: Good Progress towards PT goals: Progressing toward goals    Frequency    Min 1X/week       Co-evaluation PT/OT/SLP Co-Evaluation/Treatment: Yes Reason for Co-Treatment: For patient/therapist safety PT goals addressed during session: Mobility/safety with mobility OT goals addressed during session: ADL's and self-care;Strengthening/ROM      AM-PAC PT "6 Clicks" Mobility   Outcome Measure  Help needed turning from your back to your side while in a flat bed without using bedrails?: A Little Help needed moving from lying on your back to sitting on the side of a flat bed without using bedrails?: A Little Help needed moving to and from a bed to a chair (including a wheelchair)?: Total Help needed standing up from a chair using your arms (e.g., wheelchair or bedside chair)?: Total Help needed to walk in hospital room?: Total Help needed climbing 3-5 steps with a railing? : Total 6 Click Score: 10    End of Session   Activity Tolerance: Patient tolerated treatment well Patient left: in bed;with call bell/phone within reach Nurse Communication: Mobility status;Other (comment) (need for rewrapping residual limbs and for transfer to standard hospital bed for improved mobilization) PT Visit Diagnosis: Other abnormalities of gait and mobility (R26.89);Muscle weakness (generalized) (M62.81);Pain Pain - Right/Left:  (bil) Pain - part of body: Leg     Time: 1610-9604 PT Time Calculation (min) (ACUTE ONLY): 33 min  Charges:    $Therapeutic Exercise: 8-22 mins PT General Charges $$ ACUTE PT VISIT: 1 Visit                     Shauntea Lok B. Beverely Risen PT, DPT Acute Rehabilitation Services Please use secure chat or  Call Office [phone]    [name] 11/18/2022, 1:39 PM

## 2022-11-18 NOTE — Progress Notes (Signed)
Occupational Therapy Treatment Patient Details Name: Kelli Hudson MRN: [id number] DOB: 06-19-1990 Today's Date: 11/18/2022   History of present illness Patient is 32 y.o. female presented to hospital 10/03/22 for bil foot wounds, fever, and pain for ~2 weeks with no improvement with antibiotics. Pt admitted for sepsis with necrotizing fasciitis/gangrene bilateral feet elected for surgical management. Pt underwent excision and debridement of BLE extending feet to thighs on 10/08/22 and remained intubated in ICU. 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 PMH significant for DM, HTN, gastric pacemaker.   OT comments  Patient supine in bed upon arrival into room and agreeable to participate in therapy session this date.  Patient completed long sitting in bed with use of bed rails to bring trunk to seated position with min A x2.  Once seated upright in bed, patient required min A for initially unsupported siting balance that progress to  CGA/ Supervision with patient able to sit without UE support. Patient completed 10 x tricep extensions/ chair pushups from bed level to lift bottom off bed to begin progress of scooting to EOB when appropriate.  Patient able to complete task with SBA. Patient able to then returned to supine in bed with HOB and complete long sitting position using be rails to assist trunk to seated position with Supervision with verbal cues for technique.  Patient is making progress toward OT goals and would benefit from additional OT intervention to address functional deficits       If plan is discharge home, recommend the following:  Two people to help with walking and/or transfers;Two people to help with bathing/dressing/bathroom;Assistance with cooking/housework;Assist for transportation;Help with stairs or ramp for entrance   Equipment Recommendations       Recommendations for Other Services      Precautions / Restrictions  Precautions Precautions: Fall Restrictions Weight Bearing Restrictions: Yes RLE Weight Bearing: Non weight bearing LLE Weight Bearing: Non weight bearing Other Position/Activity Restrictions: B BKA on 9/6 with B AKA revision on 9/27       Mobility Bed Mobility Overal bed mobility: Needs Assistance Bed Mobility: Rolling Rolling: Min assist, Used rails, Contact guard assist   Supine to sit: Min assist, +2 for physical assistance, Used rails, HOB elevated, Contact guard Sit to supine: Contact guard assist   General bed mobility comments: light min A to CGA for rolling onto R and L hip for LE exercises, minAx2 for coming to long sitting in bed with HoB elevated and pt use of bedrails, pt able to lower her self to bed surface with eccentric core control, on second bout of sitting pt able to achieve long sitting with use of bed rails and CGA    Transfers                         Balance Overall balance assessment: Needs assistance Sitting-balance support: No upper extremity supported Sitting balance-Leahy Scale: Fair Sitting balance - Comments: able to long sit without UE in center of bed                                   ADL either performed or assessed with clinical judgement   ADL Overall ADL's : Needs assistance/impaired Eating/Feeding: Independent;Bed level  Extremity/Trunk Assessment Upper Extremity Assessment Upper Extremity Assessment: Overall WFL for tasks assessed   Lower Extremity Assessment RLE Deficits / Details: post AKA on 9/27 LLE Deficits / Details: post AKA on 9/27        Vision       Perception     Praxis      Cognition Arousal: Alert Behavior During Therapy: [facility] for tasks assessed/performed Overall Cognitive Status: Within Functional Limits for tasks assessed                                          Exercises      Shoulder Instructions        General Comments pt with difficulty moving on air mattress, messaged MD about moving back to standard bed with trapeze, also inquired about different dressing possibly shrinkers as Ace wraps not staying in place with mobilization    Pertinent Vitals/ Pain       Pain Assessment Pain Assessment: 0-10 Pain Score: 9  Faces Pain Scale: Hurts whole lot Facial Expression: Relaxed, neutral Body Movements: Protection Muscle Tension: Relaxed Pain Location: B LE Pain Descriptors / Indicators: Aching, Grimacing, Guarding, Sore Pain Intervention(s): Monitored during session, Premedicated before session  Home Living                                          Prior Functioning/Environment              Frequency  Min 1X/week        Progress Toward Goals  OT Goals(current goals can now be found in the care plan section)  Progress towards OT goals: Progressing toward goals  Acute Rehab OT Goals OT Goal Formulation: With patient Time For Goal Achievement: 11/29/22 Potential to Achieve Goals: Good ADL Goals Pt Will Perform Grooming: sitting;with modified independence Pt Will Perform Lower Body Bathing: bed level;with set-up Pt Will Perform Lower Body Dressing: bed level;with supervision;with set-up Pt Will Transfer to Toilet: anterior/posterior transfer;with contact guard assist;bedside commode Pt/caregiver will Perform Home Exercise Program: Increased strength;Both right and left upper extremity;With theraband;With written HEP provided  Plan      Co-evaluation    PT/OT/SLP Co-Evaluation/Treatment: Yes Reason for Co-Treatment: For patient/therapist safety PT goals addressed during session: Mobility/safety with mobility OT goals addressed during session: ADL's and self-care;Strengthening/ROM      AM-PAC OT "6 Clicks" Daily Activity     Outcome Measure   Help from another person eating meals?: None Help from another person taking care of personal  grooming?: A Little Help from another person toileting, which includes using toliet, bedpan, or urinal?: A Lot Help from another person bathing (including washing, rinsing, drying)?: A Lot Help from another person to put on and taking off regular upper body clothing?: A Little Help from another person to put on and taking off regular lower body clothing?: A Lot 6 Click Score: 16    End of Session    OT Visit Diagnosis: Muscle weakness (generalized) (M62.81);Pain   Activity Tolerance Patient tolerated treatment well   Patient Left in bed;with call bell/phone within reach   Nurse Communication Mobility status        Time: 8657-8469 OT Time Calculation (min): 44 min  Charges: OT General Charges $OT Visit: 1 Visit OT Treatments $Self Care/Home Management :  38-52 mins  Governor Specking OT/L  Denice Paradise 11/18/2022, 2:58 PM

## 2022-11-19 DIAGNOSIS — M726 Necrotizing fasciitis: Secondary | ICD-10-CM | POA: Diagnosis not present

## 2022-11-19 DIAGNOSIS — R52 Pain, unspecified: Secondary | ICD-10-CM | POA: Diagnosis not present

## 2022-11-19 DIAGNOSIS — Z515 Encounter for palliative care: Secondary | ICD-10-CM | POA: Diagnosis not present

## 2022-11-19 LAB — GLUCOSE, CAPILLARY
Glucose-Capillary: 63 mg/dL — ABNORMAL LOW (ref 70–99)
Glucose-Capillary: 77 mg/dL (ref 70–99)
Glucose-Capillary: 167 mg/dL — ABNORMAL HIGH (ref 70–99)
Glucose-Capillary: 90 mg/dL (ref 70–99)
Glucose-Capillary: 152 mg/dL — ABNORMAL HIGH (ref 70–99)

## 2022-11-19 LAB — BASIC METABOLIC PANEL
Anion gap: 5 (ref 5–15)
BUN: 28 mg/dL — ABNORMAL HIGH (ref 6–20)
CO2: 21 mmol/L — ABNORMAL LOW (ref 22–32)
Calcium: 7.9 mg/dL — ABNORMAL LOW (ref 8.9–10.3)
Chloride: 108 mmol/L (ref 98–111)
Creatinine, Ser: 0.95 mg/dL (ref 0.44–1.00)
GFR, Estimated: >60 mL/min (ref >60)
Glucose, Bld: 72 mg/dL (ref 70–99)
Potassium: 5.2 mmol/L — ABNORMAL HIGH (ref 3.5–5.1)
Sodium: 134 mmol/L — ABNORMAL LOW (ref 135–145)

## 2022-11-19 LAB — CBC WITH DIFFERENTIAL/PLATELET
Abs Immature Granulocytes: 0.22 10*3/uL — ABNORMAL HIGH (ref 0.00–0.07)
Basophils Absolute: 0.1 10*3/uL (ref 0.0–0.1)
Basophils Relative: 1 %
Eosinophils Absolute: 0.4 10*3/uL (ref 0.0–0.5)
Eosinophils Relative: 2 %
HCT: 26.2 % — ABNORMAL LOW (ref 36.0–46.0)
Hemoglobin: 8.8 g/dL — ABNORMAL LOW (ref 12.0–15.0)
Immature Granulocytes: 1 %
Lymphocytes Relative: 21 %
Lymphs Abs: 3.9 10*3/uL (ref 0.7–4.0)
MCH: 30.7 pg (ref 26.0–34.0)
MCHC: 33.6 g/dL (ref 30.0–36.0)
MCV: 91.3 fL (ref 80.0–100.0)
Monocytes Absolute: 1.1 10*3/uL — ABNORMAL HIGH (ref 0.1–1.0)
Monocytes Relative: 6 %
Neutro Abs: 12.7 10*3/uL — ABNORMAL HIGH (ref 1.7–7.7)
Neutrophils Relative %: 69 %
Platelets: 575 10*3/uL — ABNORMAL HIGH (ref 150–400)
RBC: 2.87 MIL/uL — ABNORMAL LOW (ref 3.87–5.11)
RDW: 13.6 % (ref 11.5–15.5)
WBC: 18.4 10*3/uL — ABNORMAL HIGH (ref 4.0–10.5)
nRBC: 0 % (ref 0.0–0.2)

## 2022-11-19 MED ORDER — HYDROMORPHONE HCL 2 MG PO TABS
4.0000 mg | ORAL_TABLET | ORAL | Status: DC | PRN
  Administered 2022-11-19 – 2022-11-20 (×4): 4 mg via ORAL
  Filled 2022-11-19 (×4): qty 2

## 2022-11-19 NOTE — Progress Notes (Signed)
Physical Therapy Treatment Patient Details Name: Kelli Hudson MRN: [id number] DOB: February 11, 1991 Today's Date: 11/19/2022   History of Present Illness Patient is 32 y.o. female presented to hospital 10/03/22 for bil foot wounds, fever, and pain for ~2 weeks with no improvement with antibiotics. Pt admitted for sepsis with necrotizing fasciitis/gangrene bilateral feet elected for surgical management. Pt underwent excision and debridement of BLE extending feet to thighs on 10/08/22 and remained intubated in ICU. 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 PMH significant for DM, HTN, gastric pacemaker.    PT Comments  Pt requesting bed pan on entry, and able to place with min A for rolling to R. Requires mod A for rolling to remove bed pan. [employer] rep arrived with trapeze during session. Pt able to perform pullups on trapeze and use it to assist in off weighting her hips for bed mobility. With min A pt able to come to EoB using bed rail and HoB elevated. Pt sat EoB >15 min working on stretching spine and working on seated balance with change in CoG. D/c plan remains appropriate at this time. Will work on wheelchair transfers and mobility in next session.    If plan is discharge home, recommend the following: Two people to help with walking and/or transfers;Two people to help with bathing/dressing/bathroom;Assistance with cooking/housework;Assist for transportation;Help with stairs or ramp for entrance;Supervision due to cognitive status   Can travel by private vehicle     No  Equipment Recommendations  Wheelchair (measurements PT);Wheelchair cushion (measurements PT)       Precautions / Restrictions Precautions Precautions: Fall Restrictions Weight Bearing Restrictions: Yes RLE Weight Bearing: Non weight bearing LLE Weight Bearing: Non weight bearing Other Position/Activity Restrictions: B BKA on 9/6 multiple additional debridements with B AKA revision on 9/27      Mobility  Bed Mobility Overal bed mobility: Needs Assistance Bed Mobility: Rolling Rolling: Min assist, Used rails, Contact guard assist   Supine to sit: Min assist, +2 for physical assistance, Used rails, HOB elevated, Contact guard Sit to supine: Contact guard assist   General bed mobility comments: minA for rolling far enough to place and remove bedpan, able to come to long sitting with min guard posteriorly,    Transfers Overall transfer level: Needs assistance                Lateral/Scoot Transfers: Min assist General transfer comment: pt with good offloading hips, minA for pad scoot to come to seated EoB          Balance Overall balance assessment: Needs assistance Sitting-balance support: No upper extremity supported Sitting balance-Leahy Scale: Fair Sitting balance - Comments: able to sit EoB with no UE supprt                                    Cognition Arousal: Alert Behavior During Therapy: WFL for tasks assessed/performed Overall Cognitive Status: Within Functional Limits for tasks assessed                                          Exercises Amputee Exercises Hip Extension: AROM, 5 reps, Sidelying, Both, PROM (hip flexor stretch 30 sec hold x3) Hip ABduction/ADduction: AROM, 5 reps, Sidelying, Both Other Exercises Other Exercises: pullups to trapeze x15 Other Exercises: spinal twist x2 each  side Other Exercises: reaching outside BoS L and R Other Exercises: anterior posterion lean    General Comments        Pertinent Vitals/Pain Pain Assessment Pain Assessment: 0-10 Faces Pain Scale: Hurts whole lot Pain Location: B LE with return to supine Pain Descriptors / Indicators: Aching, Grimacing, Guarding, Sore Pain Intervention(s): Limited activity within patient's tolerance, Monitored during session, Premedicated before session, Repositioned, Patient requesting pain meds-RN notified     PT Goals (current  goals can now be found in the care plan section) Acute Rehab PT Goals PT Goal Formulation: With patient Time For Goal Achievement: 11/29/22 Potential to Achieve Goals: Good    Frequency    Min 1X/week       AM-PAC PT "6 Clicks" Mobility   Outcome Measure  Help needed turning from your back to your side while in a flat bed without using bedrails?: A Little Help needed moving from lying on your back to sitting on the side of a flat bed without using bedrails?: A Little Help needed moving to and from a bed to a chair (including a wheelchair)?: Total Help needed standing up from a chair using your arms (e.g., wheelchair or bedside chair)?: Total Help needed to walk in hospital room?: Total Help needed climbing 3-5 steps with a railing? : Total 6 Click Score: 10    End of Session   Activity Tolerance: Patient tolerated treatment well Patient left: in bed;with call bell/phone within reach Nurse Communication: Mobility status;Other (comment);Patient requests pain meds (ready for dressing change) PT Visit Diagnosis: Other abnormalities of gait and mobility (R26.89);Muscle weakness (generalized) (M62.81);Pain Pain - Right/Left:  (bil) Pain - part of body: Leg     Time: 1610-9604 PT Time Calculation (min) (ACUTE ONLY): 31 min  Charges:    $Therapeutic Exercise: 8-22 mins $Therapeutic Activity: 8-22 mins PT General Charges $$ ACUTE PT VISIT: 1 Visit                     Kelli Hudson B. Beverely Risen PT, DPT Acute Rehabilitation Services Please use secure chat or  Call Office [phone]    Kelli Hudson 11/19/2022, 4:12 PM

## 2022-11-19 NOTE — TOC Progression Note (Signed)
Transition of Care [facility]) - Progression Note    Patient Details  Name: Kelli Hudson MRN: [id number] Date of Birth: 07-23-1990  [facility] CM/SW Contact  Eduard Roux, [place] Phone Number: 11/19/2022, 3:51 PM  Clinical Narrative:     CSW met with patient's mother. CSW introduced self and explained role. CSW had long discussion w/ mother regarding disposition plan. CSW provided a listening ear, as she expressed her concerns about patient's continued care and eligible services. All questions answered.  CSW advised, [facility] reconfirmed today, they have bed availability.  CSW will inform SNF to start authorization.  TOC will continue to follow and assist with discharge planning.  Antony Blackbird, MSW, LCSW Clinical Social Worker       Expected Discharge Plan: Skilled Nursing Facility Barriers to Discharge: Continued Medical Work up, SNF Pending bed offer  Expected Discharge Plan and Services In-house Referral: Clinical Social Work   Post Acute Care Choice: Skilled Nursing Facility Living arrangements for the past 2 months: Apartment                                       Social Determinants of Health (SDOH) Interventions SDOH Screenings   Food Insecurity: No Food Insecurity (10/03/2022)  Housing: Low Risk  (10/03/2022)  Transportation Needs: No Transportation Needs (10/03/2022)  Utilities: Not At Risk (10/03/2022)  Alcohol Screen: Low Risk  (05/29/2021)  Depression (PHQ2-9): Low Risk  (05/29/2021)  Recent Concern: Depression (PHQ2-9) - Medium Risk (04/10/2021)  Financial Resource Strain: Low Risk  (02/13/2022)   Received from [facility], [facility]  Social Connections: Unknown (06/17/2021)   Received from [facility], [facility]  Stress: No Stress Concern Present (09/16/2022)   Received from [facility]  Tobacco Use: Low Risk  (11/14/2022)    Readmission Risk Interventions    05/08/2022    9:45 AM 03/24/2022    3:33 PM  12/12/2021    8:59 AM  Readmission Risk Prevention Plan  Transportation Screening Complete Complete Complete  Medication Review (RN Care Manager) Complete Complete Complete  PCP or Specialist appointment within 3-5 days of discharge Complete Complete Complete  HRI or Home Care Consult Complete Complete Complete  SW Recovery Care/Counseling Consult Complete Complete Complete  Palliative Care Screening Not Applicable Not Applicable Not Applicable  Skilled Nursing Facility Not Applicable Not Applicable Not Applicable

## 2022-11-19 NOTE — Progress Notes (Addendum)
Palliative Medicine Inpatient Follow Up Note HPI: 32 y.o. female with medical history significant of DM1, diabetic gastroparesis with very frequent admission in the past, HTN presents to the hospital with bilateral foot wounds for about 2 weeks. Not relieved with oral antibiotics. She was admitted and treated with IV antibiotics but ultimately required extensive debridement and subsequent amputations for necrotizing fasciitis. Palliative care has been asked to get involved to assist with goals of care conversations.   Today's Discussion 11/19/2022  *Please note that this is a verbal dictation therefore any spelling or grammatical errors are due to the "Dragon Medical One" system interpretation.  Chart reviewed inclusive of vital signs, progress notes, laboratory results, and diagnostic images.   I spoke to patients RN, Whitney Post who shares patient remains to have ongoing pain.   I met with Jazymn this morning at her bedside. She shares that there is improvement in her pain though it is still not quite where she would want it to be. I shared we can increase her PRN dilaudid and we will likely increase her fentanyl patch when it is next changed.   Jamira shares that she does have a good appetite.  Ronee expresses that she does have active bowel movement and is moving them "too much" though she understands that this is being done with intention given her increased K level.   Lyrick denies nausea at this time. She denies shortness of breath.  ______________________________________ Addendum:  I spoke with patients mother, [name] this morning briefly.   [name] shares that [place] may benefit from counseling moving into the future. I shared agreement with this plan.   Robbie and I plan to speak later in the day as she had to get off the phone for a Zoom meeting.  _______________________________________  Addendum #2  I met at bedside this late afternoon with [place], her mother, her aunt, and  her grandmother. We discussed the conversation as above and active symptom management.   We reviewed Rebel's grief in the setting of her losing limbs. She is agreeable to support groups. We also discussed the need for 1:1 counseling which she would also agree to. Support information printed and provided to [place].   Reviewed the discharge plan which TOC is helping to navigate. I shared that I would reach out to the team to provide Robbie an update.   Patient expresses willingness to meet with the PMT chaplain, Alvino Chapel for additional support.   Questions and concerns addressed/Palliative Support Provided.   Additional Time: 30  Objective Assessment: Vital Signs Vitals:   11/19/22 0330 11/19/22 0757  BP: (!) 147/91 (!) 147/92  Pulse:  88  Resp: 19 10  Temp: 98.3 F (36.8 C) 98.4 F (36.9 C)  SpO2: 98% 100%    Intake/Output Summary (Last 24 hours) at 11/19/2022 1037 Last data filed at 11/19/2022 6967 Gross per 24 hour  Intake --  Output 8125 ml  Net -8125 ml   Last Weight  Most recent update: 11/12/2022  5:29 AM    Weight  100.1 kg (220 lb 10.9 oz)            Gen:  Young AA F in NAD HEENT: moist mucous membranes CV: Irregular rate and  regular rhythm, no murmurs rubs or gallops PULM:  On RA, breathing is even and nonlabored ABD: Swollen, not tender EXT:  (+) edema, (+) dressing Neuro: Alert and oriented x3   SUMMARY OF RECOMMENDATIONS   Full Code / Full scope of care  Symptom support as below    Palliative care will continue to follow   Code Status/Advance Care Planning: FULL CODE   Symptom Management:  Acute on chronic pain: - PCA has been DC'd by primary - Will start fentanyl patch at 74mcg/hr - Increase dilaudid to 4mg  PO Q4H severe pain - Can continue dilaudid 0.5mg  IVP Q4H for breakthrough - Continue robaxin 1000mg  PO TID and 500mg  PO/IV Q6H PRN  - Continue Neurontin 300mg  PO Q8H - Continue Cymbalta 30mg  PO Qday  - Referral to Physical Medicine and  Rehabilitation - Dr. Carlis Abbott for ongoing symptom support in the setting of BLE AKAs  Nausea: - Zofran 4mg  IVP Q6H PRN  - Reglan 10mg  PO Q8H - Reglan 5-10mg  PO/IV Q8H PRN  Bowel Ppx: - Bisacodyl 10mg  PO/PR PRN - Holding off on scheduled regiment in the setting of patient getting tx for hyperkalemia (+) active bowel movements  Mental/Emotional: - Appreciate PT department who specialize in amputations checking in with patient and providing additional resources for support groups/couseling  Social: - Appreciate MSW support helping patients mother navigate discharge options  Billing based on MDM: High ______________________________________________________________________________________ Lamarr Lulas [facility] Palliative Medicine Team Team Cell Phone: [phone] Please utilize secure chat with additional questions, if there is no response within 30 minutes please call the above phone number  Palliative Medicine Team providers are available by phone from 7am to 7pm daily and can be reached through the team cell phone.  Should this patient require assistance outside of these hours, please call the patient's attending physician.

## 2022-11-19 NOTE — Progress Notes (Signed)
PROGRESS NOTE    Kelli Hudson  ZOX:[id number] DOB: [birth year] DOA: 10/03/2022 PCP: Norm Salt, PA   Brief Narrative:  32 y.o. female with medical history significant of DM1, diabetic gastroparesis with very frequent admissions in the past, HTN presents to the hospital with bilateral foot wounds for about 2 weeks.  Not relieved with oral antibiotics.  She was admitted and treated with IV antibiotics but ultimately required extensive debridement and subsequent amputations for necrotizing fasciitis.   Chronology of events as below.  Since her surgery intermittently having anemia without any obvious signs of blood loss requiring PRBC transfusion, WBC still remains elevated but stable. Repeat CT showed persistent fluid collection, s/p OR 9/18, and again on 9/20 for repeat debridement. Polymicrobial infection.  Sx and multiple revisions/debridements on 9/18, 19, 25, 26, 9/25 back to the OR for more debridement.  Nonviable tissue on below-knee amputation stumps and thigh wounds.  9/27, back to OR for revision. Remains in the hospital, postop recovery, recurrent anemia.  Currently pain managed with Dilaudid PCA.  9/30, some clinical stabilization now.  WBC trending down.    Assessment & Plan:   Principal Problem:   Necrotizing fasciitis (HCC) Active Problems:   AKI (acute kidney injury) (HCC)   Sepsis due to cellulitis (HCC)   Cellulitis of lower extremity   Anemia   Diabetic gastroparesis associated with type 1 diabetes mellitus (HCC)   DM type 1 (diabetes mellitus, type 1) (HCC)   Drug-seeking behavior   Sepsis (HCC)   Necrotizing fasciitis of pelvic region and thigh (HCC)   Necrotizing fasciitis of lower leg (HCC)   MDD (major depressive disorder), recurrent episode, moderate (HCC)   Effusion, left knee  Severe sepsis due to bilateral leg abscess, necrotizing fasciitis, POA.  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 -I&D  8/21 -Bilateral BKA on 9/6 -Back to OR and multiple debridements 9/18, 9/19, 9/25.  Amputation revisions. 9/18, culture with steno and Klebsiella, MDR ESBL and rare yeast 9/20, surgical culture with Klebsiella and steno.   9/27, revision and bilateral above-knee amputations. Surgical margins reported to be clear after bilateral above-knee amputation.  Discontinued all antibiotics 9/30 Previously requiring Dilaudid PCA pump for pain control, able to wean down aggressively over the past week. Dilaudid IV dose continues to decrease 0.5 every 4 hour, fentanyl patch ongoing -Appreciate palliative care assistance with pain management   Hypotension with tachycardia History of hypertension Midodrine discontinued as blood pressure improves   Acute blood loss anemia due to operative blood loss Patient received total of 17 units of blood transfusions.   Hemoglobin stabilizing - now uptrending appropriately.   Type 1 diabetes, poorly controlled with intermittent hypoglycemia Blood sugars are controlled on long-acting insulin 4 units twice daily, sliding scale insulin.  Avoid hypoglycemia.     Acute kidney injury on CKD stage IIIa due to septic ATN, improving Non-anion gap metabolic acidosis, hyperkalemia Renal function stabilizing, continue Lokelma for hyperkalemia, downtrending appropriately   Severe protein calorie malnutrition Encouraging oral diet, dietitian following.  Encourage oral diet Fortunately, she does not have gastroparesis symptoms these days and able to eat regular diet.  Occasionally using nausea medicine.   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  Currently not having active symptoms.  Occasionally using nausea medicine.    DVT prophylaxis: heparin injection 5,000 Units Start: 10/27/22 1400   Code Status:   Code Status: Full Code  Family Communication: Mother/family updated over the phone  Status is: Inpatient  Dispo: The patient is  from: Home               Anticipated d/c is to: SNF pending safe disposition              Anticipated d/c date is: 24 to 48 hours              Patient currently is medically stable for discharge  Consultants:  PCCM, podiatry, orthopedic surgery  Antimicrobials:  Completed  Subjective: No acute issues or events overnight denies nausea vomiting diarrhea constipation headache fevers chills or chest pain  Objective: Vitals:   11/18/22 2255 11/18/22 2330 11/19/22 0330 11/19/22 0757  BP: (!) 161/98  (!) 147/91 (!) 147/92  Pulse: (!) 106 98  88  Resp: 13 18 19 10   Temp: 98.3 F (36.8 C)  98.3 F (36.8 C) 98.4 F (36.9 C)  TempSrc: Oral  Oral Oral  SpO2: 100% 99% 98% 100%  Weight:      Height:        Intake/Output Summary (Last 24 hours) at 11/19/2022 0800 Last data filed at 11/19/2022 0530 Gross per 24 hour  Intake --  Output 7200 ml  Net -7200 ml   Filed Weights   11/10/22 0500 11/11/22 0331 11/12/22 0313  Weight: 101.8 kg 100 kg 100.1 kg    Examination:  General:  Pleasantly resting in bed, No acute distress. HEENT:  Normocephalic atraumatic.  Sclerae nonicteric, noninjected.  Extraocular movements intact bilaterally. Neck:  Without mass or deformity.  Trachea is midline. Lungs:  Clear to auscultate bilaterally without rhonchi, wheeze, or rales. Heart:  Regular rate and rhythm.  Without murmurs, rubs, or gallops. Abdomen:  Soft, nontender, nondistended.  Without guarding or rebound. Extremities: Bilateral AKA, PICC line right upper extremity.   Data Reviewed: I have personally reviewed following labs and imaging studies  CBC: Recent Labs  Lab 11/15/22 0530 11/15/22 2215 11/16/22 0752 11/16/22 1740 11/17/22 0520 11/18/22 0750 11/19/22 0605  WBC 33.8*  --  23.7*  --  18.4* 19.5* 18.4*  NEUTROABS  --   --   --   --   --  13.7* 12.7*  HGB 6.6*   < > 7.9* 8.6* 8.4* 8.2* 8.8*  HCT 20.2*   < > 23.8* 25.5* 25.3* 24.9* 26.2*  MCV 94.0  --  91.9  --  91.7 92.2 91.3  PLT 507*  --  461*  --   475* 526* 575*   < > = values in this interval not displayed.   Basic Metabolic Panel: Recent Labs  Lab 11/15/22 0530 11/15/22 2215 11/16/22 0752 11/17/22 0520 11/18/22 0750 11/19/22 0605  NA 127*  --  130* 133* 133* 134*  K 6.4* 6.3* 5.6* 5.7* 5.7* 5.2*  CL 105  --  104 110 109 108  CO2 16*  --  19* 20* 20* 21*  GLUCOSE 381*  --  82 124* 107* 72  BUN 40*  --  49* 40* 31* 28*  CREATININE 1.97*  --  1.70* 1.50* 1.07* 0.95  CALCIUM 7.2*  --  7.5* 7.5* 7.7* 7.9*   GFR: Estimated Creatinine Clearance: 104.3 mL/min (by C-G formula based on SCr of 0.95 mg/dL). Liver Function Tests: Recent Labs  Lab 11/17/22 0520  AST 28  ALT 20  ALKPHOS 773*  BILITOT 0.3  PROT 5.1*  ALBUMIN <1.5*   No results for input(s): "LIPASE", "AMYLASE" in the last 168 hours. No results for input(s): "AMMONIA" in the last 168 hours. Coagulation Profile: No results for input(s): "INR", "PROTIME"  in the last 168 hours. Cardiac Enzymes: No results for input(s): "CKTOTAL", "CKMB", "CKMBINDEX", "TROPONINI" in the last 168 hours. BNP (last 3 results) No results for input(s): "PROBNP" in the last 8760 hours. HbA1C: Recent Labs    11/18/22 0750  HGBA1C 5.9*   CBG: Recent Labs  Lab 11/18/22 1143 11/18/22 1550 11/18/22 2103 11/19/22 0553 11/19/22 0611  GLUCAP 181* 147* 160* 63* 77   Lipid Profile: No results for input(s): "CHOL", "HDL", "LDLCALC", "TRIG", "CHOLHDL", "LDLDIRECT" in the last 72 hours. Thyroid Function Tests: No results for input(s): "TSH", "T4TOTAL", "FREET4", "T3FREE", "THYROIDAB" in the last 72 hours. Anemia Panel: No results for input(s): "VITAMINB12", "FOLATE", "FERRITIN", "TIBC", "IRON", "RETICCTPCT" in the last 72 hours. Sepsis Labs: No results for input(s): "PROCALCITON", "LATICACIDVEN" in the last 168 hours.  Recent Results (from the past 240 hour(s))  Aerobic/Anaerobic Culture w Gram Stain (surgical/deep wound)     Status: None   Collection Time: 11/12/22  4:46 PM    Specimen: Soft Tissue, Other  Result Value Ref Range Status   Specimen Description TISSUE  Final   Special Requests LEFT THIGH WD  Final   Gram Stain   Final    FEW WBC PRESENT,BOTH PMN AND MONONUCLEAR NO ORGANISMS SEEN    Culture   Final    No growth aerobically or anaerobically. Performed at [facility], [address] [place], [place] [address]    Report Status 11/17/2022 FINAL  Final  Aerobic/Anaerobic Culture w Gram Stain (surgical/deep wound)     Status: None   Collection Time: 11/12/22  4:52 PM   Specimen: Soft Tissue, Other  Result Value Ref Range Status   Specimen Description TISSUE  Final   Special Requests RIGHT THIGH WD SPEC B  Final   Gram Stain   Final    FEW WBC PRESENT,BOTH PMN AND MONONUCLEAR NO ORGANISMS SEEN    Culture   Final    No growth aerobically or anaerobically. Performed at [facility], [address], [place], [place] [address]    Report Status 11/17/2022 FINAL  Final         Radiology Studies: No results found.      Scheduled Meds:  sodium chloride   Intravenous Once   acetaminophen  1,000 mg Oral Q6H   vitamin C  1,000 mg Oral Daily   Chlorhexidine Gluconate Cloth  6 each Topical Daily   dextrose  50 mL Intravenous Once   docusate sodium  100 mg Oral Daily   DULoxetine  30 mg Oral Daily   feeding supplement (GLUCERNA SHAKE)  237 mL Oral TID BM   fentaNYL  1 patch Transdermal Q72H   gabapentin  300 mg Oral Q8H   heparin injection (subcutaneous)  5,000 Units Subcutaneous Q8H   hydrocortisone cream   Topical TID   insulin aspart  0-5 Units Subcutaneous QHS   insulin aspart  0-6 Units Subcutaneous TID WC   insulin glargine-yfgn  4 Units Subcutaneous BID   methocarbamol  1,000 mg Oral TID   metoCLOPramide  10 mg Oral Q8H   metoprolol tartrate  25 mg Oral BID   multivitamin with minerals  1 tablet Oral Daily   nutrition supplement (JUVEN)  1 packet Oral BID BM   omeprazole  40 mg Oral Daily   pantoprazole  40 mg  Oral Daily   silver nitrate applicators  1 Application Topical Once   sodium chloride flush  10-40 mL Intracatheter Q12H   sodium zirconium cyclosilicate  10 g Oral Daily   zinc sulfate  220 mg Oral Daily   Continuous Infusions:  sodium chloride 10 mL/hr at 11/13/22 1745   sodium chloride 20 mL/hr at 11/13/22 1745   sodium chloride     magnesium sulfate bolus IVPB     methocarbamol (ROBAXIN) IV Stopped (11/13/22 0617)     LOS: 47 days   Time spent:   Azucena Fallen, DO [facility]  If 7PM-7AM, please contact night-coverage [email or web address]  11/19/2022, 8:00 AM

## 2022-11-19 NOTE — Inpatient Diabetes Management (Signed)
Inpatient Diabetes Program Recommendations  AACE/ADA: New Consensus Statement on Inpatient Glycemic Control (2015)  Target Ranges:  Prepandial:   less than 140 mg/dL      Peak postprandial:   less than 180 mg/dL (1-2 hours)      Critically ill patients:  140 - 180 mg/dL   Lab Results  Component Value Date   GLUCAP 77 11/19/2022   HGBA1C 5.9 (H) 11/18/2022    Review of Glycemic Control  Latest Reference Range & Units 11/18/22 15:50 11/18/22 21:03 11/19/22 05:53 11/19/22 06:11  Glucose-Capillary 70 - 99 mg/dL 161 (H) 096 (H) 63 (L) 77  (H): Data is abnormally high (L): Data is abnormally low Diabetes history: Type 1 DM Outpatient Diabetes medications: Levemir 8 units BID, Regular 1-10 QID Current orders for Inpatient glycemic control: Semglee 4 units BID, Novolog 0-6 units TID & HS  Inpatient Diabetes Program Recommendations:     Noted hypoglycemia of 63 mg/dL. Consider slightly reducing at bedtime dose of Levemir to 2 units at bedtime.  Thanks, Lujean Rave, MSN, RNC-OB Diabetes Coordinator [phone] (8a-5p)

## 2022-11-20 DIAGNOSIS — M726 Necrotizing fasciitis: Secondary | ICD-10-CM | POA: Diagnosis not present

## 2022-11-20 DIAGNOSIS — R52 Pain, unspecified: Secondary | ICD-10-CM | POA: Diagnosis not present

## 2022-11-20 DIAGNOSIS — Z515 Encounter for palliative care: Secondary | ICD-10-CM | POA: Diagnosis not present

## 2022-11-20 DIAGNOSIS — Z7189 Other specified counseling: Secondary | ICD-10-CM | POA: Diagnosis not present

## 2022-11-20 LAB — CBC WITH DIFFERENTIAL/PLATELET
Abs Immature Granulocytes: 0.14 10*3/uL — ABNORMAL HIGH (ref 0.00–0.07)
Basophils Absolute: 0.1 10*3/uL (ref 0.0–0.1)
Basophils Relative: 0 %
Eosinophils Absolute: 0.3 10*3/uL (ref 0.0–0.5)
Eosinophils Relative: 1 %
HCT: 25.4 % — ABNORMAL LOW (ref 36.0–46.0)
Hemoglobin: 8.4 g/dL — ABNORMAL LOW (ref 12.0–15.0)
Immature Granulocytes: 1 %
Lymphocytes Relative: 16 %
Lymphs Abs: 3.1 10*3/uL (ref 0.7–4.0)
MCH: 29.9 pg (ref 26.0–34.0)
MCHC: 33.1 g/dL (ref 30.0–36.0)
MCV: 90.4 fL (ref 80.0–100.0)
Monocytes Absolute: 1.1 10*3/uL — ABNORMAL HIGH (ref 0.1–1.0)
Monocytes Relative: 6 %
Neutro Abs: 14.8 10*3/uL — ABNORMAL HIGH (ref 1.7–7.7)
Neutrophils Relative %: 76 %
Platelets: 585 10*3/uL — ABNORMAL HIGH (ref 150–400)
RBC: 2.81 MIL/uL — ABNORMAL LOW (ref 3.87–5.11)
RDW: 13.5 % (ref 11.5–15.5)
WBC: 19.5 10*3/uL — ABNORMAL HIGH (ref 4.0–10.5)
nRBC: 0 % (ref 0.0–0.2)

## 2022-11-20 LAB — GLUCOSE, CAPILLARY
Glucose-Capillary: 97 mg/dL (ref 70–99)
Glucose-Capillary: 119 mg/dL — ABNORMAL HIGH (ref 70–99)
Glucose-Capillary: 111 mg/dL — ABNORMAL HIGH (ref 70–99)
Glucose-Capillary: 123 mg/dL — ABNORMAL HIGH (ref 70–99)

## 2022-11-20 LAB — BASIC METABOLIC PANEL
Anion gap: 7 (ref 5–15)
BUN: 30 mg/dL — ABNORMAL HIGH (ref 6–20)
CO2: 19 mmol/L — ABNORMAL LOW (ref 22–32)
Calcium: 7.7 mg/dL — ABNORMAL LOW (ref 8.9–10.3)
Chloride: 107 mmol/L (ref 98–111)
Creatinine, Ser: 0.93 mg/dL (ref 0.44–1.00)
GFR, Estimated: >60 mL/min (ref >60)
Glucose, Bld: 90 mg/dL (ref 70–99)
Potassium: 4.8 mmol/L (ref 3.5–5.1)
Sodium: 133 mmol/L — ABNORMAL LOW (ref 135–145)

## 2022-11-20 MED ORDER — HYDROMORPHONE HCL 1 MG/ML IJ SOLN
0.5000 mg | Freq: Three times a day (TID) | INTRAMUSCULAR | Status: DC | PRN
  Administered 2022-11-20 – 2022-11-21 (×3): 0.5 mg via INTRAVENOUS
  Filled 2022-11-20 (×2): qty 0.5

## 2022-11-20 MED ORDER — SODIUM ZIRCONIUM CYCLOSILICATE 5 G PO PACK
5.0000 g | PACK | Freq: Every day | ORAL | Status: DC
  Administered 2022-11-20 – 2022-11-21 (×2): 5 g via ORAL
  Filled 2022-11-20 (×2): qty 1

## 2022-11-20 MED ORDER — FENTANYL 50 MCG/HR TD PT72
1.0000 | MEDICATED_PATCH | TRANSDERMAL | Status: DC
  Administered 2022-11-20: 1 via TRANSDERMAL
  Filled 2022-11-20: qty 1

## 2022-11-20 MED ORDER — HYDROMORPHONE HCL 2 MG PO TABS
2.0000 mg | ORAL_TABLET | ORAL | Status: DC | PRN
  Administered 2022-11-20 – 2022-11-22 (×7): 4 mg via ORAL
  Filled 2022-11-20 (×8): qty 2

## 2022-11-20 MED ORDER — DULOXETINE HCL 60 MG PO CPEP
60.0000 mg | ORAL_CAPSULE | Freq: Every day | ORAL | Status: DC
  Administered 2022-11-21 – 2022-11-22 (×2): 60 mg via ORAL
  Filled 2022-11-20 (×2): qty 1

## 2022-11-20 NOTE — Progress Notes (Signed)
PROGRESS NOTE    Kelli Hudson  MOQ:[id number] DOB: 08-06-1990 DOA: 10/03/2022 PCP: Norm Salt, PA   Brief Narrative:  32 y.o. female with medical history significant of DM1, diabetic gastroparesis with very frequent admissions in the past, HTN presents to the hospital with bilateral foot wounds for about 2 weeks.  Not relieved with oral antibiotics.  She was admitted and treated with IV antibiotics but ultimately required extensive debridement and subsequent amputations for necrotizing fasciitis.   Chronology of events as below.  Since her surgery intermittently having anemia without any obvious signs of blood loss requiring PRBC transfusion, WBC still remains elevated but stable. Repeat CT showed persistent fluid collection, s/p OR 9/18, and again on 9/20 for repeat debridement. Polymicrobial infection.  Sx and multiple revisions/debridements on 9/18, 19, 25, 26, 9/25 back to the OR for more debridement.  Nonviable tissue on below-knee amputation stumps and thigh wounds.  9/27, back to OR for revision. Remains in the hospital, postop recovery, recurrent anemia.  Currently pain managed with Dilaudid PCA.  9/30, some clinical stabilization now.  WBC trending down. 10/3 PICC removed -continue to wean IV narcotics down  Assessment & Plan:   Principal Problem:   Necrotizing fasciitis (HCC) Active Problems:   AKI (acute kidney injury) (HCC)   Sepsis due to cellulitis (HCC)   Cellulitis of lower extremity   Anemia   Diabetic gastroparesis associated with type 1 diabetes mellitus (HCC)   DM type 1 (diabetes mellitus, type 1) (HCC)   Drug-seeking behavior   Sepsis (HCC)   Necrotizing fasciitis of pelvic region and thigh (HCC)   Necrotizing fasciitis of lower leg (HCC)   MDD (major depressive disorder), recurrent episode, moderate (HCC)   Effusion, left knee  Severe sepsis due to bilateral leg abscess, necrotizing fasciitis, POA.  S/p bilateral transtibial amputation  and multiple extensive debridements. -I&D 8/21 -Bilateral BKA on 9/6 -Back to OR and multiple debridements 9/18, 9/19, 9/25.  Amputation revisions. 9/18, culture with steno and Klebsiella, MDR ESBL and rare yeast 9/20, surgical culture with Klebsiella and steno.   9/27, revision and bilateral above-knee amputations. Surgical margins reported to be clear after bilateral above-knee amputation.  Discontinued all antibiotics 9/30 Previously requiring Dilaudid PCA pump for pain control, able to wean down aggressively over the past week. Dilaudid IV dose continues to decrease 0.5 every 8 hour, fentanyl patch increased to 50 (previously 25) -p.o. Dilaudid 2 to 4 mg every 3 hours ongoing, will wean this down once patient is off IV Dilaudid well-controlled for 24 hours. -Appreciate palliative care assistance with pain management   Hypotension with tachycardia History of hypertension Midodrine discontinued as blood pressure improves   Acute blood loss anemia due to operative blood loss Patient received total of 17 units of blood transfusions.   Hemoglobin stabilizing - now uptrending appropriately.   Type 1 diabetes, poorly controlled with intermittent hypoglycemia Blood sugars are controlled on long-acting insulin 4 units twice daily, sliding scale insulin.  Avoid hypoglycemia.     Acute kidney injury on CKD stage IIIa due to septic ATN, improving Non-anion gap metabolic acidosis, hyperkalemia Renal function stabilizing, continue Lokelma for hyperkalemia -decrease dose today - downtrending appropriately   Severe protein calorie malnutrition Encouraging oral diet, dietitian following.  Encourage oral diet Fortunately, she does not have gastroparesis symptoms these days and able to eat regular diet.  Occasionally using nausea medicine.   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  Currently not having active symptoms.  Occasionally requires nausea  medicine.    DVT prophylaxis:  heparin injection 5,000 Units Start: 10/27/22 1400 Code Status:   Code Status: Full Code Family Communication: Mother/family updated over the phone  Status is: Inpatient Dispo: The patient is from: Home              Anticipated d/c is to: SNF pending safe disposition              Anticipated d/c date is: 24 to 48 hours              Patient currently is medically stable for discharge  Consultants:  PCCM, podiatry, orthopedic surgery  Antimicrobials:  Completed  Subjective: No acute issues or events overnight denies nausea vomiting diarrhea constipation headache fevers chills or chest pain  Objective: Vitals:   11/19/22 1659 11/19/22 2019 11/19/22 2354 11/20/22 0339  BP: (!) 147/93 (!) 155/96 (!) 166/105 (!) 139/90  Pulse: 91 100 (!) 108 89  Resp: 16 16 19 14   Temp: 98.5 F (36.9 C) 98.4 F (36.9 C) 98.6 F (37 C) 98.7 F (37.1 C)  TempSrc: Oral Oral Oral Oral  SpO2: 100% 100% 100% 100%  Weight:      Height:        Intake/Output Summary (Last 24 hours) at 11/20/2022 0759 Last data filed at 11/20/2022 0706 Gross per 24 hour  Intake --  Output 4700 ml  Net -4700 ml   Filed Weights   11/10/22 0500 11/11/22 0331 11/12/22 0313  Weight: 101.8 kg 100 kg 100.1 kg    Examination:  General:  Pleasantly resting in bed, No acute distress. HEENT:  Normocephalic atraumatic.  Sclerae nonicteric, noninjected.  Extraocular movements intact bilaterally. Neck:  Without mass or deformity.  Trachea is midline. Lungs:  Clear to auscultate bilaterally without rhonchi, wheeze, or rales. Heart:  Regular rate and rhythm.  Without murmurs, rubs, or gallops. Abdomen:  Soft, nontender, nondistended.  Without guarding or rebound. Extremities: Bilateral AKA, PICC line right upper extremity.   Data Reviewed: I have personally reviewed following labs and imaging studies  CBC: Recent Labs  Lab 11/15/22 0530 11/15/22 2215 11/16/22 0752 11/16/22 1740 11/17/22 0520 11/18/22 0750  11/19/22 0605  WBC 33.8*  --  23.7*  --  18.4* 19.5* 18.4*  NEUTROABS  --   --   --   --   --  13.7* 12.7*  HGB 6.6*   < > 7.9* 8.6* 8.4* 8.2* 8.8*  HCT 20.2*   < > 23.8* 25.5* 25.3* 24.9* 26.2*  MCV 94.0  --  91.9  --  91.7 92.2 91.3  PLT 507*  --  461*  --  475* 526* 575*   < > = values in this interval not displayed.   Basic Metabolic Panel: Recent Labs  Lab 11/15/22 0530 11/15/22 2215 11/16/22 0752 11/17/22 0520 11/18/22 0750 11/19/22 0605  NA 127*  --  130* 133* 133* 134*  K 6.4* 6.3* 5.6* 5.7* 5.7* 5.2*  CL 105  --  104 110 109 108  CO2 16*  --  19* 20* 20* 21*  GLUCOSE 381*  --  82 124* 107* 72  BUN 40*  --  49* 40* 31* 28*  CREATININE 1.97*  --  1.70* 1.50* 1.07* 0.95  CALCIUM 7.2*  --  7.5* 7.5* 7.7* 7.9*   GFR: Estimated Creatinine Clearance: 104.3 mL/min (by C-G formula based on SCr of 0.95 mg/dL). Liver Function Tests: Recent Labs  Lab 11/17/22 0520  AST 28  ALT 20  ALKPHOS 773*  BILITOT 0.3  PROT 5.1*  ALBUMIN <1.5*   No results for input(s): "LIPASE", "AMYLASE" in the last 168 hours. No results for input(s): "AMMONIA" in the last 168 hours. Coagulation Profile: No results for input(s): "INR", "PROTIME" in the last 168 hours. Cardiac Enzymes: No results for input(s): "CKTOTAL", "CKMB", "CKMBINDEX", "TROPONINI" in the last 168 hours. BNP (last 3 results) No results for input(s): "PROBNP" in the last 8760 hours. HbA1C: Recent Labs    11/18/22 0750  HGBA1C 5.9*   CBG: Recent Labs  Lab 11/19/22 0611 11/19/22 1117 11/19/22 1700 11/19/22 2147 11/20/22 0617  GLUCAP 77 167* 90 152* 97   Lipid Profile: No results for input(s): "CHOL", "HDL", "LDLCALC", "TRIG", "CHOLHDL", "LDLDIRECT" in the last 72 hours. Thyroid Function Tests: No results for input(s): "TSH", "T4TOTAL", "FREET4", "T3FREE", "THYROIDAB" in the last 72 hours. Anemia Panel: No results for input(s): "VITAMINB12", "FOLATE", "FERRITIN", "TIBC", "IRON", "RETICCTPCT" in the last 72  hours. Sepsis Labs: No results for input(s): "PROCALCITON", "LATICACIDVEN" in the last 168 hours.  Recent Results (from the past 240 hour(s))  Aerobic/Anaerobic Culture w Gram Stain (surgical/deep wound)     Status: None   Collection Time: 11/12/22  4:46 PM   Specimen: Soft Tissue, Other  Result Value Ref Range Status   Specimen Description TISSUE  Final   Special Requests LEFT THIGH WD  Final   Gram Stain   Final    FEW WBC PRESENT,BOTH PMN AND MONONUCLEAR NO ORGANISMS SEEN    Culture   Final    No growth aerobically or anaerobically. Performed at [facility], [address], [place], [place] [address]    Report Status 11/17/2022 FINAL  Final  Aerobic/Anaerobic Culture w Gram Stain (surgical/deep wound)     Status: None   Collection Time: 11/12/22  4:52 PM   Specimen: Soft Tissue, Other  Result Value Ref Range Status   Specimen Description TISSUE  Final   Special Requests RIGHT THIGH WD SPEC B  Final   Gram Stain   Final    FEW WBC PRESENT,BOTH PMN AND MONONUCLEAR NO ORGANISMS SEEN    Culture   Final    No growth aerobically or anaerobically. Performed at [facility], [address], [place], [place] [address]    Report Status 11/17/2022 FINAL  Final         Radiology Studies: No results found.      Scheduled Meds:  sodium chloride   Intravenous Once   acetaminophen  1,000 mg Oral Q6H   vitamin C  1,000 mg Oral Daily   Chlorhexidine Gluconate Cloth  6 each Topical Daily   dextrose  50 mL Intravenous Once   docusate sodium  100 mg Oral Daily   DULoxetine  30 mg Oral Daily   feeding supplement (GLUCERNA SHAKE)  237 mL Oral TID BM   fentaNYL  1 patch Transdermal Q72H   gabapentin  300 mg Oral Q8H   heparin injection (subcutaneous)  5,000 Units Subcutaneous Q8H   hydrocortisone cream   Topical TID   insulin aspart  0-5 Units Subcutaneous QHS   insulin aspart  0-6 Units Subcutaneous TID WC   insulin glargine-yfgn  4 Units Subcutaneous BID    methocarbamol  1,000 mg Oral TID   metoCLOPramide  10 mg Oral Q8H   metoprolol tartrate  25 mg Oral BID   multivitamin with minerals  1 tablet Oral Daily   nutrition supplement (JUVEN)  1 packet Oral BID BM   omeprazole  40 mg Oral Daily   pantoprazole  40 mg Oral Daily   silver nitrate applicators  1 Application Topical Once   sodium chloride flush  10-40 mL Intracatheter Q12H   sodium zirconium cyclosilicate  10 g Oral Daily   zinc sulfate  220 mg Oral Daily   Continuous Infusions:  sodium chloride 10 mL/hr at 11/13/22 1745   sodium chloride 20 mL/hr at 11/13/22 1745   sodium chloride     magnesium sulfate bolus IVPB     methocarbamol (ROBAXIN) IV Stopped (11/13/22 0617)     LOS: 48 days   Time spent:   Azucena Fallen, DO [facility]  If 7PM-7AM, please contact night-coverage [email or web address]  11/20/2022, 7:59 AM

## 2022-11-20 NOTE — Progress Notes (Signed)
Physical Therapy Treatment Patient Details Name: Kelli Hudson MRN: [id number] DOB: 1990/09/28 Today's Date: 11/20/2022   History of Present Illness Patient is 32 y.o. female presented to hospital 10/03/22 for bil foot wounds, fever, and pain for ~2 weeks with no improvement with antibiotics. Pt admitted for sepsis with necrotizing fasciitis/gangrene bilateral feet elected for surgical management. Pt underwent excision and debridement of BLE extending feet to thighs on 10/08/22 and remained intubated in ICU. 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 PMH significant for DM, HTN, gastric pacemaker.    PT Comments  Pt is eager to get out of bed to wheelchair today despite increased pain and discomfort which she describes as a  pulling pain. Discuss that while PT is very pleased with her longsitting balance in the bed, onset of pulling pain likely due to hip flexor tightening.  Discuss need for stretching backward through out the day. Will work with her on getting on to her belly as able. Pt bed mobility is supervision to contact guard, transfer to wheelchair modAx2 with pad and from w/c back to bed totalAx2 with use of bed pad to decrease residual limb pressure. Pt able to propel wheelchair independently.    If plan is discharge home, recommend the following: Two people to help with walking and/or transfers;Two people to help with bathing/dressing/bathroom;Assistance with cooking/housework;Assist for transportation;Help with stairs or ramp for entrance;Supervision due to cognitive status   Can travel by private vehicle     No  Equipment Recommendations  Wheelchair (measurements PT);Wheelchair cushion (measurements PT)       Precautions / Restrictions Precautions Precautions: Fall Restrictions Weight Bearing Restrictions: Yes RLE Weight Bearing: Non weight bearing LLE Weight Bearing: Non weight bearing Other Position/Activity Restrictions: B BKA on 9/6 multiple  additional debridements with B AKA revision on 9/27     Mobility  Bed Mobility Overal bed mobility: Needs Assistance Bed Mobility: Rolling Rolling: Min assist, Used rails, Mod assist   Supine to sit: Min assist, +2 for physical assistance, Used rails, HOB elevated, Contact guard, Modified independent (Device/Increase time) Sit to supine: Supervision   General bed mobility comments: able to achieve longsitting with use of bed and rails on bed, pt can roll with use of rails needs modA to roll far enough for bed pan placement    Transfers Overall transfer level: Needs assistance             Anterior-Posterior transfers: Mod assist, +2 physical assistance  Lateral/Scoot Transfers: Min assist General transfer comment: pt with good offloading hips, minA for pad scoot to come to seated EoB, modAx2 for posterior scooting into wheelchair, total A with use of pad to return to bed from lower wheelchair due to pulling pain in her residual limbs       Balance Overall balance assessment: Needs assistance Sitting-balance support: No upper extremity supported Sitting balance-Leahy Scale: Fair Sitting balance - Comments: able to sit EoB with no UE supprt                                    Cognition Arousal: Alert Behavior During Therapy: WFL for tasks assessed/performed Overall Cognitive Status: Within Functional Limits for tasks assessed  General Comments General comments (skin integrity, edema, etc.): provided pt with peer support information available from another woman with traumatic B AKA      Pertinent Vitals/Pain Pain Assessment Pain Assessment: 0-10 Faces Pain Scale: Hurts whole lot Pain Location: B LE with return to supine Pain Descriptors / Indicators: Aching, Grimacing, Guarding, Sore     PT Goals (current goals can now be found in the care plan section) Acute Rehab PT Goals PT Goal  Formulation: With patient Time For Goal Achievement: 11/29/22 Potential to Achieve Goals: Good Progress towards PT goals: Progressing toward goals    Frequency    Min 1X/week       AM-PAC PT "6 Clicks" Mobility   Outcome Measure  Help needed turning from your back to your side while in a flat bed without using bedrails?: A Little Help needed moving from lying on your back to sitting on the side of a flat bed without using bedrails?: A Little Help needed moving to and from a bed to a chair (including a wheelchair)?: Total Help needed standing up from a chair using your arms (e.g., wheelchair or bedside chair)?: Total Help needed to walk in hospital room?: Total Help needed climbing 3-5 steps with a railing? : Total 6 Click Score: 10    End of Session   Activity Tolerance: Patient tolerated treatment well Patient left: in bed;with call bell/phone within reach Nurse Communication: Mobility status;Other (comment);Patient requests pain meds (ready for dressing change) PT Visit Diagnosis: Other abnormalities of gait and mobility (R26.89);Muscle weakness (generalized) (M62.81);Pain Pain - Right/Left:  (bil) Pain - part of body: Leg     Time: 1610-9604 PT Time Calculation (min) (ACUTE ONLY): 62 min  Charges:    $Therapeutic Activity: 8-22 mins $Wheel Chair Management: 23-37 mins PT General Charges $$ ACUTE PT VISIT: 1 Visit                     Kelli Hudson B. Beverely Risen PT, DPT Acute Rehabilitation Services Please use secure chat or  Call Office [phone]    Elon Alas [facility] 11/20/2022, 3:51 PM

## 2022-11-20 NOTE — Progress Notes (Signed)
This chaplain responded to PMT-MYF referral for spiritual care. The Pt. is eating lunch at the time of the visit and finishes before the chaplain leaves. Family is not at the bedside.  The chaplain began rapport building with the Pt. through story telling. The Pt. smiles and laughs while sharing her pain is at a "7" during the visit. The Pt. tells the chaplain she has a "high tolerance for pain and prefers her pain to be a 5-6". The Pt. is practicing mindfulness techniques and open to learning new ones.   The chaplain understands the Pt. has a large source of support from her family and boyfriend, but is appreciative of the chaplain's listening presence and the space to ask the chaplain questions.   The Pt. describes herself as creative with the ability to visualize in her head. The Pt. enjoys fashion, cooking, gardening, and video games. Per the Pt. request, the chaplain brought the Pt. a couple of coloring mandalas and seek/find puzzles with cooking and fashion themes.  The chaplain understands the Pt. is open to a post admission counseling relationship. The Pt. agreed to begin thinking about what is important to her in a counseling relationship and how to make a counselor choice that best fits her needs. The chaplain will continue the conversation next week.  Chaplain Stephanie Acre [phone]

## 2022-11-20 NOTE — Progress Notes (Addendum)
Palliative:  HPI: 32 y.o. female with medical history significant of DM1, diabetic gastroparesis with very frequent admission in the past, HTN presents to the hospital with bilateral foot wounds for about 2 weeks. Not relieved with oral antibiotics. She was admitted and treated with IV antibiotics but ultimately required extensive debridement and subsequent amputations for necrotizing fasciitis. Palliative care has been asked to get involved to assist with goals of care conversations.   Discussed with Dr. Natale Milch. Visited with [employer]. She tells me her pain is currently 7-8/10. We reviewed plan for increasing fentanyl patch and increasing frequency of dilaudid pill. She does report she gets some relief from these interventions so hopes of increasing dosages will provide more relief. I would also consider increasing dilaudid dose. I also discussed recommendation to increase Cymbalta as this may be helpful for pain but also depression/anxiety and she agrees.   We spent some time reviewing coping skills and distractions to help her mentally cope with her significant life changes and medical situation. She enjoys video games like [place]. She enjoys tv, coloring, seek and find puzzles. Discussed audiobooks and podcasts as well. She agrees with visits from chaplain to continue support. She names her religion and spirituality as a major factor in her healing and wellbeing.   I called and updated mother, [name], with Darris's permission. [name] works in Teacher, music and has good understanding of Whitlee's condition. She has significant concern for her mental health and how this is impacting her pain. I agree with [name] and do believe that Quinlan's pain is very layered and this is why I would like to increase Cymbalta and work with her on coping and distraction. Palliative chaplain to visit and has resources for counseling that I will review with [facility] as well. [name] is trying to support Katherine the best she can and  support her to have pain control but not to use the pain medication to treat other symptoms outside of her physical pain. Unfortunately this is going to be a long and difficult road as it will take time and the situation causing her depression is ongoing. Unfortunately the only facility that has offered a bed is in [place] and away from her family and support system as well.   All questions/concerns addressed. Emotional support provided.   Exam: Alert, oriented. No distress. No grimacing, bilateral arms tense. Breathing regular, unlabored. Abd soft. B AKA.   Plan: - Full code, full scope  Acute on chronic pain: PCA has been DC'd by primary Increase fentanyl patch at 50 mcg/hr 11/20/22 Dilaudid to 4mg  PO Q3H severe pain Dilaudid 0.5mg  IVP Q8H for breakthrough Robaxin 1000mg  PO TID and 500mg  PO/IV Q6H PRN  Neurontin 300mg  PO Q8H Increase Cymbalta 60mg  PO Qday  Referral to Physical Medicine and Rehabilitation - Dr. Carlis Abbott for ongoing symptom support in the setting of BLE AKAs   Nausea: Zofran 4mg  IVP Q6H PRN  Reglan 10mg  PO Q8H Reglan 5-10mg  PO/IV Q8H PRN   Bowel Ppx: Bisacodyl 10mg  PO/PR PRN Holding off on scheduled regiment in the setting of patient getting tx for hyperkalemia (+) active bowel movements LBM 11/20/22   Mental/Emotional/Spiritual: Appreciate PT department who specialize in amputations checking in with patient and providing additional resources for support groups/counseling Chaplain support   55 min  Yong Channel, NP Palliative Medicine Team Pager [phone] (Please see [email or web address] for schedule) Team Phone [phone]    Greater than 50%  of this time was spent counseling and coordinating care related to the above assessment and plan

## 2022-11-20 NOTE — Progress Notes (Signed)
Orthopedic Tech Progress Note Patient Details:  Kelli Hudson 10/24/1990 [id number]  Order for 2 AK shrinkers called into [facility].  Patient ID: Kelli Hudson, female   DOB: August 25, 1990, 31 y.o.   MRN: [id number]  Docia Furl 11/20/2022, 2:17 PM

## 2022-11-20 NOTE — Progress Notes (Signed)
Occupational Therapy Treatment Patient Details Name: Kelli Hudson MRN: [id number] DOB: 28-Feb-1990 Today's Date: 11/20/2022   History of present illness Patient is 32 y.o. female presented to hospital 10/03/22 for bil foot wounds, fever, and pain for ~2 weeks with no improvement with antibiotics. Pt admitted for sepsis with necrotizing fasciitis/gangrene bilateral feet elected for surgical management. Pt underwent excision and debridement of BLE extending feet to thighs on 10/08/22 and remained intubated in ICU. 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 PMH significant for DM, HTN, gastric pacemaker.   OT comments  Patient sitting unsupported in bed upon arrival into room and agreeable to participate in therapy session this date. Patient completed Oral hygiene with Supervision s/p setup along  UE HEP using blue theraband of chest press and tricep extensions along with 10 reps of tricep pushup from bed and clearing buttocks off mattress  in prep for functional transfers. Patient then completed unsupported sitting in bed to scooting to EOB with SBA and increased time, once seated EOB 1 of her LE bandages fell on floor and required RN to re-wrap while patient  was supine in bed. Patient completed long sitting in bed with use of bed rails to bring trunk to seated position with SBA and increased time. Once seated upright in bed, patient left with all needs within reach. Patient is making progress toward OT goals and would benefit from additional OT intervention to address functional deficits       If plan is discharge home, recommend the following:  Two people to help with walking and/or transfers;Two people to help with bathing/dressing/bathroom;Assistance with cooking/housework;Assist for transportation;Help with stairs or ramp for entrance   Equipment Recommendations  Wheelchair (measurements OT);Wheelchair cushion (measurements OT)    Recommendations for Other  Services      Precautions / Restrictions Precautions Precautions: Fall Restrictions Weight Bearing Restrictions: Yes RLE Weight Bearing: Non weight bearing LLE Weight Bearing: Non weight bearing Other Position/Activity Restrictions: B BKA on 9/6 multiple additional debridements with B AKA revision on 9/27       Mobility Bed Mobility Overal bed mobility: Modified Independent (patient able to complete long sitting in bed using bed rials to pull self to seated postion with SBA)       Supine to sit: Modified independent (Device/Increase time), Used rails Sit to supine: Supervision (patient completing unsupported sitting in bed upon arrival into room)        Transfers Overall transfer level: Needs assistance (patient completed seated scoot to EOB with Supervision with increased time, once seated EOB, 1 of her LE bandages fell off and reqired RN to re-don bandages)                Lateral/Scoot Transfers: Contact guard assist       Balance Overall balance assessment: Needs assistance Sitting-balance support: No upper extremity supported   Sitting balance - Comments: able to sit EoB with no UE supprt                                   ADL either performed or assessed with clinical judgement   ADL Overall ADL's : Needs assistance/impaired Eating/Feeding: Independent;Bed level   Grooming: Set up;Bed level Grooming Details (indicate cue type and reason): patient completed oral hygiene while long sitting in bed  Extremity/Trunk Assessment Upper Extremity Assessment Upper Extremity Assessment: Overall WFL for tasks assessed            Vision       Perception     Praxis      Cognition Arousal: Alert Behavior During Therapy: WFL for tasks assessed/performed Overall Cognitive Status: Within Functional Limits for tasks assessed                                          Exercises  Exercises: General Upper Extremity General Exercises - Upper Extremity Shoulder Flexion: AROM, 10 reps, Seated, Strengthening, Theraband Theraband Level (Shoulder Flexion): Level 4 (Blue) Elbow Extension: AROM, 10 reps, Seated, Theraband Theraband Level (Elbow Extension): Level 4 (Blue) Chair Push Up: 10 reps, Seated    Shoulder Instructions       General Comments      Pertinent Vitals/ Pain       Pain Assessment Pain Assessment: 0-10 Pain Score: 8  Faces Pain Scale: Hurts whole lot Pain Location: B LEs Pain Descriptors / Indicators: Aching, Grimacing, Guarding Pain Intervention(s): Monitored during session  Home Living                                          Prior Functioning/Environment              Frequency  Min 1X/week        Progress Toward Goals  OT Goals(current goals can now be found in the care plan section)  Progress towards OT goals: Progressing toward goals  Acute Rehab OT Goals OT Goal Formulation: With patient Time For Goal Achievement: 11/29/22 Potential to Achieve Goals: Good ADL Goals Pt Will Perform Grooming: sitting;with modified independence Pt Will Perform Lower Body Bathing: bed level;with set-up Pt Will Perform Lower Body Dressing: bed level;with supervision;with set-up Pt Will Transfer to Toilet: anterior/posterior transfer;with contact guard assist;bedside commode Pt/caregiver will Perform Home Exercise Program: Increased strength;Both right and left upper extremity;With theraband;With written HEP provided  Plan      Co-evaluation                 AM-PAC OT "6 Clicks" Daily Activity     Outcome Measure   Help from another person eating meals?: None Help from another person taking care of personal grooming?: A Little Help from another person toileting, which includes using toliet, bedpan, or urinal?: A Lot Help from another person bathing (including washing, rinsing, drying)?: A Lot Help from another  person to put on and taking off regular upper body clothing?: A Little Help from another person to put on and taking off regular lower body clothing?: A Lot 6 Click Score: 16    End of Session    OT Visit Diagnosis: Muscle weakness (generalized) (M62.81);Pain Pain - Right/Left: Left Pain - part of body: Leg   Activity Tolerance Patient tolerated treatment well   Patient Left in bed;with call bell/phone within reach   Nurse Communication Mobility status        Time: 6213-0865 OT Time Calculation (min): 33 min  Charges: OT General Charges $OT Visit: 1 Visit OT Treatments $Self Care/Home Management : 8-22 mins $Therapeutic Exercise: 8-22 mins  Governor Specking OT/L  Denice Paradise 11/20/2022, 2:45 PM

## 2022-11-20 NOTE — Plan of Care (Signed)
°  Problem: Education: Goal: Knowledge of General Education information will improve Description: Including pain rating scale, medication(s)/side effects and non-pharmacologic comfort measures Outcome: Progressing   Problem: Health Behavior/Discharge Planning: Goal: Ability to manage health-related needs will improve Outcome: Progressing   Problem: Clinical Measurements: Goal: Ability to maintain clinical measurements within normal limits will improve Outcome: Progressing Goal: Will remain free from infection Outcome: Progressing Goal: Diagnostic test results will improve Outcome: Progressing Goal: Respiratory complications will improve Outcome: Progressing Goal: Cardiovascular complication will be avoided Outcome: Progressing   Problem: Activity: Goal: Risk for activity intolerance will decrease Outcome: Progressing   Problem: Nutrition: Goal: Adequate nutrition will be maintained Outcome: Progressing   Problem: Coping: Goal: Level of anxiety will decrease Outcome: Progressing   Problem: Elimination: Goal: Will not experience complications related to bowel motility Outcome: Progressing Goal: Will not experience complications related to urinary retention Outcome: Progressing   Problem: Pain Managment: Goal: General experience of comfort will improve Outcome: Progressing   Problem: Safety: Goal: Ability to remain free from injury will improve Outcome: Progressing   Problem: Skin Integrity: Goal: Risk for impaired skin integrity will decrease Outcome: Progressing   Problem: Education: Goal: Ability to describe self-care measures that may prevent or decrease complications (Diabetes Survival Skills Education) will improve Outcome: Progressing   Problem: Coping: Goal: Ability to adjust to condition or change in health will improve Outcome: Progressing   Problem: Fluid Volume: Goal: Ability to maintain a balanced intake and output will improve Outcome:  Progressing   Problem: Health Behavior/Discharge Planning: Goal: Ability to identify and utilize available resources and services will improve Outcome: Progressing Goal: Ability to manage health-related needs will improve Outcome: Progressing   Problem: Metabolic: Goal: Ability to maintain appropriate glucose levels will improve Outcome: Progressing   Problem: Nutritional: Goal: Maintenance of adequate nutrition will improve Outcome: Progressing Goal: Progress toward achieving an optimal weight will improve Outcome: Progressing   Problem: Skin Integrity: Goal: Risk for impaired skin integrity will decrease Outcome: Progressing   Problem: Tissue Perfusion: Goal: Adequacy of tissue perfusion will improve Outcome: Progressing   Problem: Education: Goal: Knowledge of the prescribed therapeutic regimen will improve Outcome: Progressing Goal: Ability to verbalize activity precautions or restrictions will improve Outcome: Progressing Goal: Understanding of discharge needs will improve Outcome: Progressing   Problem: Activity: Goal: Ability to perform//tolerate increased activity and mobilize with assistive devices will improve Outcome: Progressing   Problem: Clinical Measurements: Goal: Postoperative complications will be avoided or minimized Outcome: Progressing   Problem: Self-Care: Goal: Ability to meet self-care needs will improve Outcome: Progressing   Problem: Self-Concept: Goal: Ability to maintain and perform role responsibilities to the fullest extent possible will improve Outcome: Progressing   Problem: Pain Management: Goal: Pain level will decrease with appropriate interventions Outcome: Progressing   Problem: Skin Integrity: Goal: Demonstration of wound healing without infection will improve Outcome: Progressing

## 2022-11-21 ENCOUNTER — Encounter (HOSPITAL_COMMUNITY): Payer: Self-pay | Admitting: Orthopedic Surgery

## 2022-11-21 DIAGNOSIS — M726 Necrotizing fasciitis: Secondary | ICD-10-CM | POA: Diagnosis not present

## 2022-11-21 DIAGNOSIS — Z89612 Acquired absence of left leg above knee: Secondary | ICD-10-CM | POA: Diagnosis not present

## 2022-11-21 DIAGNOSIS — R52 Pain, unspecified: Secondary | ICD-10-CM | POA: Diagnosis not present

## 2022-11-21 DIAGNOSIS — Z89611 Acquired absence of right leg above knee: Secondary | ICD-10-CM

## 2022-11-21 DIAGNOSIS — Z515 Encounter for palliative care: Secondary | ICD-10-CM | POA: Diagnosis not present

## 2022-11-21 DIAGNOSIS — Z7189 Other specified counseling: Secondary | ICD-10-CM | POA: Diagnosis not present

## 2022-11-21 LAB — BASIC METABOLIC PANEL
Anion gap: 6 (ref 5–15)
BUN: 35 mg/dL — ABNORMAL HIGH (ref 6–20)
CO2: 19 mmol/L — ABNORMAL LOW (ref 22–32)
Calcium: 7.7 mg/dL — ABNORMAL LOW (ref 8.9–10.3)
Chloride: 107 mmol/L (ref 98–111)
Creatinine, Ser: 0.72 mg/dL (ref 0.44–1.00)
GFR, Estimated: >60 mL/min (ref >60)
Glucose, Bld: 162 mg/dL — ABNORMAL HIGH (ref 70–99)
Potassium: 4.5 mmol/L (ref 3.5–5.1)
Sodium: 132 mmol/L — ABNORMAL LOW (ref 135–145)

## 2022-11-21 LAB — GLUCOSE, CAPILLARY
Glucose-Capillary: 111 mg/dL — ABNORMAL HIGH (ref 70–99)
Glucose-Capillary: 74 mg/dL (ref 70–99)
Glucose-Capillary: 166 mg/dL — ABNORMAL HIGH (ref 70–99)
Glucose-Capillary: 71 mg/dL (ref 70–99)
Glucose-Capillary: 170 mg/dL — ABNORMAL HIGH (ref 70–99)

## 2022-11-21 MED ORDER — HYDROMORPHONE HCL 1 MG/ML IJ SOLN
0.5000 mg | Freq: Two times a day (BID) | INTRAMUSCULAR | Status: DC | PRN
  Administered 2022-11-21 (×2): 0.5 mg via INTRAVENOUS
  Filled 2022-11-21 (×2): qty 0.5

## 2022-11-21 NOTE — Progress Notes (Signed)
Nutrition Follow-up  DOCUMENTATION CODES:   Not applicable  INTERVENTION:  Continue to encourage adequate nutritional intake Continue double protein portions with meals Discontinue Magic cup Glucerna Shake po TID, each supplement provides 220 kcal and 10 grams of protein Juven BID to support wound healing  NUTRITION DIAGNOSIS:   Increased nutrient needs related to wound healing as evidenced by estimated needs. -remains applicable  GOAL:   Patient will meet greater than or equal to 90% of their needs - progressing with meals and nutrition supplements  MONITOR:   PO intake, Supplement acceptance  REASON FOR ASSESSMENT:   Consult Enteral/tube feeding initiation and management  ASSESSMENT:   32 y.o. female admits related to leg swelling. PMH includes: acute H. Pylori gastric ulcer, DM, gastroparesis, DKA, GERD, HTN, hyperthyroidism, normocytic anemia. Pt is currently receiving medical management related to sepsis due to cellulitis.  9/6- s/p bilateral BKA, applications of wound vac x 2, application of stump shrinker, aspiration of lt knee; advanced to full liquid diet 9/8- advanced to soft diet  9/9- cortrak d/c 9/14- per ID, CT scans of bilateral legs reveal hematoma  9/20 - debridement bilateral BKA 9/25 - repeat debridement bilateral BKA 9/27 - bilateral AKA   Spoke with pt at bedside. She reports having a good appetite overall. She recalls eating 100% of most meals. Tolerating Juven supplements though they aren't her favorite. Continues to consume double protein portions and Glucerna about 2-3 times daily, though per review of MAR pt did not receive first 2 orders of glucerna today. Magic cup has been "too sweet" for her and has not been consuming these.  Denies having any flares of gastroparesis. She does endorses nausea and 1 episode of emesis for about the last 3 days but is uncertain the cause; relieved with PRN zofran.   Meal completions:  9/27: 100% dinner 9/28:  75% breakfast 10/3: 100% dinner 10/4: 80% breakfast  Medications: Vitamin C, colace, SSI 0-5 units at bedtime, SSI 0-6 units TID, semglee 4 units BID, reglan q8h, MVI, protonix, lokelma, zinc   Labs: sodium 133, BUN 30, CBG's 74-123 x24 hours  Diet Order:   Diet Order             Diet Carb Modified Fluid consistency: Thin; Room service appropriate? Yes  Diet effective now                   EDUCATION NEEDS:   Not appropriate for education at this time  Skin:  Skin Assessment: Reviewed RN Assessment (closed incision bilateral thighs)  Last BM:  10/3 (type 5 large)  Height:   Ht Readings from Last 1 Encounters:  11/05/22 5\' 7"  (1.702 m)    Weight:   Wt Readings from Last 1 Encounters:  06/18/22 83.5 kg    Ideal Body Weight:  53.8 kg (adjusted for bilateral BKAs)  BMI:  Body mass index is 34.56 kg/m.  Estimated Nutritional Needs:   Kcal:  1950-2150  Protein:  120-135 grams  Fluid:  > 2 L  Drusilla Kanner, RDN, LDN Clinical Nutrition

## 2022-11-21 NOTE — Progress Notes (Signed)
RN offer putting stump shrinker. Stated that her stumps were pain and sensitive at this time. Pt preferred to try them the next day.   Lawson Radar, RN

## 2022-11-21 NOTE — Progress Notes (Signed)
Physical Therapy Treatment Patient Details Name: Kelli Hudson MRN: [id number] DOB: [birth year] Today's Date: 11/21/2022   History of Present Illness Patient is 32 y.o. female presented to hospital 10/03/22 for bil foot wounds, fever, and pain for ~2 weeks with no improvement with antibiotics. Pt admitted for sepsis with necrotizing fasciitis/gangrene bilateral feet elected for surgical management. Pt underwent excision and debridement of BLE extending feet to thighs on 10/08/22 and remained intubated in ICU. 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 PMH significant for DM, HTN, gastric pacemaker.    PT Comments  Focus of session attempting to come to prone to work on hip and back extension. With min-modAx2 Pt able to roll to L sidelying for R hip extension, and then roll to semi prone for ~5 min. Returned to supine. With min-modAx2 able to roll to  R sidelying, where she was able to perform L hip extension exercises. Pt attempted but did not tolerate rolling to prone from R side. Pt returned to supine in flattened bed,with rolling L able to place pillow under pt sacrum to increase hip extension. Pt tolerated ~5 min and worked on shoulder and chest opening exercises. Pt able to perform LE hip flexion lifts in supine x5  and with use of trapeze able to offweight hips enough to remove pillow. Pt making great progress towards her goals.    If plan is discharge home, recommend the following: Two people to help with walking and/or transfers;Two people to help with bathing/dressing/bathroom;Assistance with cooking/housework;Assist for transportation;Help with stairs or ramp for entrance;Supervision due to cognitive status   Can travel by private vehicle     No  Equipment Recommendations  Wheelchair (measurements PT);Wheelchair cushion (measurements PT)       Precautions / Restrictions Precautions Precautions: Fall Restrictions Weight Bearing Restrictions: Yes RLE  Weight Bearing: Non weight bearing LLE Weight Bearing: Non weight bearing Other Position/Activity Restrictions: B BKA on 9/6 multiple additional debridements with B AKA revision on 9/27     Mobility  Bed Mobility Overal bed mobility: Needs Assistance Bed Mobility: Rolling Rolling: Min assist, Used rails, Mod assist         General bed mobility comments: min A for coming into sidelying on L, mod A for coming to prone with L shoulder under her, min A for coming into sidelying on R, modA for attempting to come over onto prone on R unable to tolerate as deep a turn due to incisional pain          Balance Overall balance assessment: Needs assistance Sitting-balance support: No upper extremity supported Sitting balance-Leahy Scale: Fair Sitting balance - Comments: able to sit EoB with no UE supprt                                    Cognition Arousal: Alert Behavior During Therapy: WFL for tasks assessed/performed Overall Cognitive Status: Within Functional Limits for tasks assessed                                          Exercises General Exercises - Upper Extremity Shoulder ABduction: AROM, Both, Supine Elbow Extension: AROM, Both, Supine Amputee Exercises Hip Extension: AROM, Right, Left, Both, Sidelying Other Exercises Other Exercises: bilateral hip flexion using abdominal muscles x10 in fully supine Other Exercises: with  use of trapeze able and bilateral hip flexion able to bring hips off of bed to remove pillow    General Comments General comments (skin integrity, edema, etc.): some frank red blood bleed through dressing on R residual limb with moving RN notified      Pertinent Vitals/Pain Pain Assessment Pain Assessment: 0-10 Pain Score: 6  Faces Pain Scale: Hurts whole lot Pain Location: B LE with return to supine Pain Descriptors / Indicators: Aching, Grimacing, Guarding, Sore Pain Intervention(s): Limited activity within  patient's tolerance, Monitored during session, Repositioned     PT Goals (current goals can now be found in the care plan section) Acute Rehab PT Goals PT Goal Formulation: With patient Time For Goal Achievement: 11/29/22 Potential to Achieve Goals: Good Progress towards PT goals: Progressing toward goals    Frequency    Min 1X/week       AM-PAC PT "6 Clicks" Mobility   Outcome Measure  Help needed turning from your back to your side while in a flat bed without using bedrails?: A Little Help needed moving from lying on your back to sitting on the side of a flat bed without using bedrails?: A Little Help needed moving to and from a bed to a chair (including a wheelchair)?: Total Help needed standing up from a chair using your arms (e.g., wheelchair or bedside chair)?: Total Help needed to walk in hospital room?: Total Help needed climbing 3-5 steps with a railing? : Total 6 Click Score: 10    End of Session   Activity Tolerance: Patient tolerated treatment well Patient left: in bed;with call bell/phone within reach Nurse Communication: Mobility status;Other (comment);Patient requests pain meds (ready for dressing change) PT Visit Diagnosis: Other abnormalities of gait and mobility (R26.89);Muscle weakness (generalized) (M62.81);Pain Pain - Right/Left:  (bil) Pain - part of body: Leg     Time: 7829-5621 PT Time Calculation (min) (ACUTE ONLY): 34 min  Charges:    $Therapeutic Exercise: 8-22 mins $Therapeutic Activity: 8-22 mins PT General Charges $$ ACUTE PT VISIT: 1 Visit                     Kelli Hudson B. Beverely Risen PT, DPT Acute Rehabilitation Services Please use secure chat or  Call Office [phone]    Kelli Hudson 11/21/2022, 4:01 PM

## 2022-11-21 NOTE — Progress Notes (Addendum)
Palliative:  HPI: 32 y.o. female with medical history significant of DM1, diabetic gastroparesis with very frequent admission in the past, HTN presents to the hospital with bilateral foot wounds for about 2 weeks. Not relieved with oral antibiotics. She was admitted and treated with IV antibiotics but ultimately required extensive debridement and subsequent amputations for necrotizing fasciitis. Palliative care has been asked to get involved to assist with goals of care conversations.   I met today again with [facility]. Reviewed pain medication and pain assessment per RN. Jannessa is working with therapy and on her belly. Vernelle is smiling and appears to be in better spirits. We reviewed her progress with therapy today and encouraged that she should be very proud of this step! She reports that her pain is becoming much better controlled and close to 6/10. I am encouraged as she is likely still not reaping the complete benefit of increase in fentanyl patch or Cymbalta yet so there is potential for even better improvement without further adjustments to regimen. Plans for transition to SNF soon.   I spoke with [place] about outpatient palliative support and she agrees with referral and follow up. I spoke with her about resources provided to pursue counseling to ensure that we are addressing all aspects of her wellbeing to heal. Marca will look into these resources and is interested in pursuing.   All questions/concerns addressed. Emotional support provided.   Exam: Alert, oriented. No distress. Prone position. Better spirits today. More relaxed demeanor. Breathing regular, unlabored.   Plan:  - Full code, full scope   Acute on chronic pain: PCA has been DC'd by primary Fentanyl patch at 50 mcg/hr 11/20/22 Dilaudid to 4mg  PO Q3H severe pain Dilaudid 0.5mg  IVP Q12H for breakthrough Robaxin 1000mg  PO TID and 500mg  PO/IV Q6H PRN  Neurontin 300mg  PO Q8H Cymbalta 60mg  PO Qday  Referral to outpatient  palliative via [facility].    Nausea: Zofran 4mg  IVP Q6H PRN  Reglan 10mg  PO Q8H Reglan 5-10mg  PO/IV Q8H PRN   Bowel Ppx: Bisacodyl 10mg  PO/PR PRN Holding off on scheduled regiment in the setting of patient getting tx for hyperkalemia (+) active bowel movements LBM 11/20/22   Mental/Emotional/Spiritual: Appreciate PT department who specialize in amputations checking in with patient and providing additional resources for support groups/counseling Chaplain support  25 min   Yong Channel, NP Palliative Medicine Team Pager [phone] (Please see [email or web address] for schedule) Team Phone [phone]    Greater than 50%  of this time was spent counseling and coordinating care related to the above assessment and plan

## 2022-11-21 NOTE — Progress Notes (Signed)
PROGRESS NOTE    Kelli Hudson  WJX:[id number] DOB: 05-09-1990 DOA: 10/03/2022 PCP: Norm Salt, PA   Brief Narrative:  32 y.o. female with medical history significant of DM1, diabetic gastroparesis with very frequent admissions in the past, HTN presents to the hospital with bilateral foot wounds for about 2 weeks.  Not relieved with oral antibiotics.  She was admitted and treated with IV antibiotics but ultimately required extensive debridement and subsequent amputations for necrotizing fasciitis.   Chronology of events as below.  Since her surgery intermittently having anemia without any obvious signs of blood loss requiring PRBC transfusion, WBC still remains elevated but stable. Repeat CT showed persistent fluid collection, s/p OR 9/18, and again on 9/20 for repeat debridement. Polymicrobial infection.  Sx and multiple revisions/debridements on 9/18, 19, 25, 26, 9/25 back to the OR for more debridement.  Nonviable tissue on below-knee amputation stumps and thigh wounds.  9/27, back to OR for revision. Remains in the hospital, postop recovery, recurrent anemia.  Currently pain managed with Dilaudid PCA.  9/30, some clinical stabilization now.  WBC trending down. 10/3 PICC removed -continue to wean IV narcotics down  Assessment & Plan:   Principal Problem:   Necrotizing fasciitis (HCC) Active Problems:   AKI (acute kidney injury) (HCC)   Sepsis due to cellulitis (HCC)   Cellulitis of lower extremity   Anemia   Diabetic gastroparesis associated with type 1 diabetes mellitus (HCC)   DM type 1 (diabetes mellitus, type 1) (HCC)   Drug-seeking behavior   Sepsis (HCC)   Necrotizing fasciitis of pelvic region and thigh (HCC)   Necrotizing fasciitis of lower leg (HCC)   MDD (major depressive disorder), recurrent episode, moderate (HCC)   Effusion, left knee  Severe sepsis due to bilateral leg abscess, necrotizing fasciitis, POA.  S/p bilateral transtibial amputation  and multiple extensive debridements. -I&D 8/21 -Bilateral BKA on 9/6 -Back to OR and multiple debridements 9/18, 9/19, 9/25.  Amputation revisions. 9/18, culture with steno and Klebsiella, MDR ESBL and rare yeast 9/20, surgical culture with Klebsiella and steno.   9/27, revision and bilateral above-knee amputations. Surgical margins reported to be clear after bilateral above-knee amputation.  Discontinued all antibiotics 9/30 Previously requiring Dilaudid PCA pump - Dilaudid IV dose continues to decrease - now 0.5 every 12 hours (4 doses total) then discontinue -Fentanyl patch increased to 50 - tolerating well -PO Dilaudid 2 to 4 mg every 3 hours ongoing, will wean this down once patient is off IV Dilaudid well-controlled for 24 hours. -Appreciate palliative care assistance with pain management   Hypotension with tachycardia History of hypertension Midodrine discontinued as blood pressure improves   Acute blood loss anemia due to operative blood loss Patient received total of 17 units of blood transfusions.   Hemoglobin stabilizing - now uptrending appropriately.   Type 1 diabetes, poorly controlled with intermittent hypoglycemia Blood sugars are controlled on long-acting insulin 4 units twice daily, sliding scale insulin.  Avoid hypoglycemia.     Acute kidney injury on CKD stage IIIa due to septic ATN, improving Non-anion gap metabolic acidosis, hyperkalemia Renal function stabilizing, DC lokelma - if K rises over the next 24-48h would resume at low dose.   Severe protein calorie malnutrition Encouraging oral diet, dietitian following.  Encourage oral diet Fortunately, she does not have gastroparesis symptoms these days and able to eat regular diet.  Occasionally using nausea medicine.   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  Currently not having active symptoms.  Occasionally  requires nausea medicine.    DVT prophylaxis: heparin injection 5,000 Units Start:  10/27/22 1400 Code Status:   Code Status: Full Code Family Communication: Mother/family updated over the phone previously  Status is: Inpatient Dispo: The patient is from: Home              Anticipated d/c is to: SNF pending safe disposition              Anticipated d/c date is: 24 to 48 hours              Patient currently is medically stable for discharge  Consultants:  PCCM, podiatry, orthopedic surgery  Antimicrobials:  Completed  Subjective: No acute issues or events overnight denies nausea vomiting diarrhea constipation headache fevers chills or chest pain  Objective: Vitals:   11/20/22 1929 11/20/22 2153 11/20/22 2344 11/21/22 0509  BP: (!) 150/101 (!) 137/90 122/81 (!) 145/95  Pulse: (!) 119 100 91 90  Resp: 20 16 15 16   Temp: 98.9 F (37.2 C) 98.7 F (37.1 C) 98.4 F (36.9 C) 98.6 F (37 C)  TempSrc: Oral Oral Oral Oral  SpO2: 100% 99% 100% 100%  Weight:      Height:        Intake/Output Summary (Last 24 hours) at 11/21/2022 0804 Last data filed at 11/20/2022 2344 Gross per 24 hour  Intake 240 ml  Output 2300 ml  Net -2060 ml   Filed Weights   11/11/22 0331 11/12/22 0313  Weight: 100 kg 100.1 kg    Examination:  General:  Pleasantly resting in bed, No acute distress. HEENT:  Normocephalic atraumatic.  Sclerae nonicteric, noninjected.  Extraocular movements intact bilaterally. Neck:  Without mass or deformity.  Trachea is midline. Lungs:  Clear to auscultate bilaterally without rhonchi, wheeze, or rales. Heart:  Regular rate and rhythm.  Without murmurs, rubs, or gallops. Abdomen:  Soft, nontender, nondistended.  Without guarding or rebound. Extremities: Bilateral AKA, PICC line right upper extremity.   Data Reviewed: I have personally reviewed following labs and imaging studies  CBC: Recent Labs  Lab 11/16/22 0752 11/16/22 1740 11/17/22 0520 11/18/22 0750 11/19/22 0605 11/20/22 0808  WBC 23.7*  --  18.4* 19.5* 18.4* 19.5*  NEUTROABS  --    --   --  13.7* 12.7* 14.8*  HGB 7.9* 8.6* 8.4* 8.2* 8.8* 8.4*  HCT 23.8* 25.5* 25.3* 24.9* 26.2* 25.4*  MCV 91.9  --  91.7 92.2 91.3 90.4  PLT 461*  --  475* 526* 575* 585*   Basic Metabolic Panel: Recent Labs  Lab 11/16/22 0752 11/17/22 0520 11/18/22 0750 11/19/22 0605 11/20/22 0808  NA 130* 133* 133* 134* 133*  K 5.6* 5.7* 5.7* 5.2* 4.8  CL 104 110 109 108 107  CO2 19* 20* 20* 21* 19*  GLUCOSE 82 124* 107* 72 90  BUN 49* 40* 31* 28* 30*  CREATININE 1.70* 1.50* 1.07* 0.95 0.93  CALCIUM 7.5* 7.5* 7.7* 7.9* 7.7*   GFR: Estimated Creatinine Clearance: 106.5 mL/min (by C-G formula based on SCr of 0.93 mg/dL). Liver Function Tests: Recent Labs  Lab 11/17/22 0520  AST 28  ALT 20  ALKPHOS 773*  BILITOT 0.3  PROT 5.1*  ALBUMIN <1.5*   No results for input(s): "LIPASE", "AMYLASE" in the last 168 hours. No results for input(s): "AMMONIA" in the last 168 hours. Coagulation Profile: No results for input(s): "INR", "PROTIME" in the last 168 hours. Cardiac Enzymes: No results for input(s): "CKTOTAL", "CKMB", "CKMBINDEX", "TROPONINI" in the last 168 hours.  BNP (last 3 results) No results for input(s): "PROBNP" in the last 8760 hours. HbA1C: No results for input(s): "HGBA1C" in the last 72 hours.  CBG: Recent Labs  Lab 11/20/22 0617 11/20/22 1310 11/20/22 1702 11/20/22 2129 11/21/22 0616  GLUCAP 97 119* 111* 123* 111*   Lipid Profile: No results for input(s): "CHOL", "HDL", "LDLCALC", "TRIG", "CHOLHDL", "LDLDIRECT" in the last 72 hours. Thyroid Function Tests: No results for input(s): "TSH", "T4TOTAL", "FREET4", "T3FREE", "THYROIDAB" in the last 72 hours. Anemia Panel: No results for input(s): "VITAMINB12", "FOLATE", "FERRITIN", "TIBC", "IRON", "RETICCTPCT" in the last 72 hours. Sepsis Labs: No results for input(s): "PROCALCITON", "LATICACIDVEN" in the last 168 hours.  Recent Results (from the past 240 hour(s))  Aerobic/Anaerobic Culture w Gram Stain (surgical/deep  wound)     Status: None   Collection Time: 11/12/22  4:46 PM   Specimen: Soft Tissue, Other  Result Value Ref Range Status   Specimen Description TISSUE  Final   Special Requests LEFT THIGH WD  Final   Gram Stain   Final    FEW WBC PRESENT,BOTH PMN AND MONONUCLEAR NO ORGANISMS SEEN    Culture   Final    No growth aerobically or anaerobically. Performed at [facility], [address] [place], [place] [address]    Report Status 11/17/2022 FINAL  Final  Aerobic/Anaerobic Culture w Gram Stain (surgical/deep wound)     Status: None   Collection Time: 11/12/22  4:52 PM   Specimen: Soft Tissue, Other  Result Value Ref Range Status   Specimen Description TISSUE  Final   Special Requests RIGHT THIGH WD SPEC B  Final   Gram Stain   Final    FEW WBC PRESENT,BOTH PMN AND MONONUCLEAR NO ORGANISMS SEEN    Culture   Final    No growth aerobically or anaerobically. Performed at [facility], [address], [place], [place] [address]    Report Status 11/17/2022 FINAL  Final         Radiology Studies: No results found.      Scheduled Meds:  sodium chloride   Intravenous Once   acetaminophen  1,000 mg Oral Q6H   vitamin C  1,000 mg Oral Daily   Chlorhexidine Gluconate Cloth  6 each Topical Daily   dextrose  50 mL Intravenous Once   docusate sodium  100 mg Oral Daily   DULoxetine  60 mg Oral Daily   feeding supplement (GLUCERNA SHAKE)  237 mL Oral TID BM   fentaNYL  1 patch Transdermal Q72H   gabapentin  300 mg Oral Q8H   heparin injection (subcutaneous)  5,000 Units Subcutaneous Q8H   hydrocortisone cream   Topical TID   insulin aspart  0-5 Units Subcutaneous QHS   insulin aspart  0-6 Units Subcutaneous TID WC   insulin glargine-yfgn  4 Units Subcutaneous BID   methocarbamol  1,000 mg Oral TID   metoCLOPramide  10 mg Oral Q8H   metoprolol tartrate  25 mg Oral BID   multivitamin with minerals  1 tablet Oral Daily   nutrition supplement (JUVEN)  1 packet Oral  BID BM   omeprazole  40 mg Oral Daily   pantoprazole  40 mg Oral Daily   silver nitrate applicators  1 Application Topical Once   sodium chloride flush  10-40 mL Intracatheter Q12H   sodium zirconium cyclosilicate  5 g Oral Daily   zinc sulfate  220 mg Oral Daily   Continuous Infusions:  sodium chloride 10  mL/hr at 11/13/22 1745   sodium chloride 20 mL/hr at 11/13/22 1745   sodium chloride     magnesium sulfate bolus IVPB     methocarbamol (ROBAXIN) IV Stopped (11/13/22 0617)     LOS: 49 days   Time spent:   Azucena Fallen, DO [facility]  If 7PM-7AM, please contact night-coverage [email or web address]  11/21/2022, 8:04 AM

## 2022-11-21 NOTE — TOC Progression Note (Addendum)
Transition of Care [facility]) - Progression Note    Patient Details  Name: Kelli Hudson MRN: [id number] Date of Birth: April 09, 1990  [facility] CM/SW Contact  Eduard Roux, [place] Phone Number: 11/21/2022, 10:22 AM  Clinical Narrative:     12:23 pm- per MD, not stable, possible d/c tomorrow. CSW updated SNF.   12:19- pm informed patient's mother we have authorization for [place] [facility] and will update MD for d/c readiness. She states understanding.  10:22 am CSW called patient's mother, mailbox was full, unable to leave message.   Antony Blackbird, MSW, LCSW Clinical Social Worker     Expected Discharge Plan: Skilled Nursing Facility Barriers to Discharge: Continued Medical Work up, SNF Pending bed offer  Expected Discharge Plan and Services In-house Referral: Clinical Social Work   Post Acute Care Choice: Skilled Nursing Facility Living arrangements for the past 2 months: Apartment                                       Social Determinants of Health (SDOH) Interventions SDOH Screenings   Food Insecurity: No Food Insecurity (10/03/2022)  Housing: Low Risk  (10/03/2022)  Transportation Needs: No Transportation Needs (10/03/2022)  Utilities: Not At Risk (10/03/2022)  Alcohol Screen: Low Risk  (05/29/2021)  Depression (PHQ2-9): Low Risk  (05/29/2021)  Recent Concern: Depression (PHQ2-9) - Medium Risk (04/10/2021)  Financial Resource Strain: Low Risk  (02/13/2022)   Received from [facility], [facility]  Social Connections: Unknown (06/17/2021)   Received from [facility], [facility]  Stress: No Stress Concern Present (09/16/2022)   Received from [facility]  Tobacco Use: Low Risk  (11/14/2022)    Readmission Risk Interventions    05/08/2022    9:45 AM 03/24/2022    3:33 PM 12/12/2021    8:59 AM  Readmission Risk Prevention Plan  Transportation Screening Complete Complete Complete  Medication Review (RN Care Manager) Complete  Complete Complete  PCP or Specialist appointment within 3-5 days of discharge Complete Complete Complete  HRI or Home Care Consult Complete Complete Complete  SW Recovery Care/Counseling Consult Complete Complete Complete  Palliative Care Screening Not Applicable Not Applicable Not Applicable  Skilled Nursing Facility Not Applicable Not Applicable Not Applicable

## 2022-11-22 DIAGNOSIS — M726 Necrotizing fasciitis: Secondary | ICD-10-CM | POA: Diagnosis not present

## 2022-11-22 LAB — CBC WITH DIFFERENTIAL/PLATELET
Abs Immature Granulocytes: 0.09 10*3/uL — ABNORMAL HIGH (ref 0.00–0.07)
Basophils Absolute: 0.1 10*3/uL (ref 0.0–0.1)
Basophils Relative: 1 %
Eosinophils Absolute: 0.3 10*3/uL (ref 0.0–0.5)
Eosinophils Relative: 2 %
HCT: 24.9 % — ABNORMAL LOW (ref 36.0–46.0)
Hemoglobin: 8.2 g/dL — ABNORMAL LOW (ref 12.0–15.0)
Immature Granulocytes: 1 %
Lymphocytes Relative: 17 %
Lymphs Abs: 2.7 10*3/uL (ref 0.7–4.0)
MCH: 31.1 pg (ref 26.0–34.0)
MCHC: 32.9 g/dL (ref 30.0–36.0)
MCV: 94.3 fL (ref 80.0–100.0)
Monocytes Absolute: 1.2 10*3/uL — ABNORMAL HIGH (ref 0.1–1.0)
Monocytes Relative: 8 %
Neutro Abs: 11 10*3/uL — ABNORMAL HIGH (ref 1.7–7.7)
Neutrophils Relative %: 71 %
Platelets: 588 10*3/uL — ABNORMAL HIGH (ref 150–400)
RBC: 2.64 MIL/uL — ABNORMAL LOW (ref 3.87–5.11)
RDW: 13.7 % (ref 11.5–15.5)
WBC: 15.3 10*3/uL — ABNORMAL HIGH (ref 4.0–10.5)
nRBC: 0 % (ref 0.0–0.2)

## 2022-11-22 LAB — BASIC METABOLIC PANEL
Anion gap: 7 (ref 5–15)
BUN: 36 mg/dL — ABNORMAL HIGH (ref 6–20)
CO2: 21 mmol/L — ABNORMAL LOW (ref 22–32)
Calcium: 7.8 mg/dL — ABNORMAL LOW (ref 8.9–10.3)
Chloride: 109 mmol/L (ref 98–111)
Creatinine, Ser: 0.87 mg/dL (ref 0.44–1.00)
GFR, Estimated: >60 mL/min (ref >60)
Glucose, Bld: 178 mg/dL — ABNORMAL HIGH (ref 70–99)
Potassium: 4.4 mmol/L (ref 3.5–5.1)
Sodium: 137 mmol/L (ref 135–145)

## 2022-11-22 LAB — GLUCOSE, CAPILLARY
Glucose-Capillary: 198 mg/dL — ABNORMAL HIGH (ref 70–99)
Glucose-Capillary: 178 mg/dL — ABNORMAL HIGH (ref 70–99)

## 2022-11-22 MED ORDER — BISACODYL 5 MG PO TBEC: 5.0000 mg | DELAYED_RELEASE_TABLET | Freq: Every day | ORAL | 0 refills | Status: DC | PRN

## 2022-11-22 MED ORDER — GABAPENTIN 300 MG PO CAPS: 300.0000 mg | ORAL_CAPSULE | Freq: Three times a day (TID) | ORAL | 0 refills | Status: AC

## 2022-11-22 MED ORDER — LOPERAMIDE HCL 2 MG PO CAPS: 4.0000 mg | ORAL_CAPSULE | Freq: Four times a day (QID) | ORAL | 0 refills | Status: DC | PRN

## 2022-11-22 MED ORDER — POLYETHYLENE GLYCOL 3350 17 G PO PACK: 17.0000 g | PACK | Freq: Every day | ORAL | 0 refills | Status: DC | PRN

## 2022-11-22 MED ORDER — GLUCERNA SHAKE PO LIQD: 237.0000 mL | Freq: Three times a day (TID) | ORAL | 0 refills | Status: DC

## 2022-11-22 MED ORDER — ALUM & MAG HYDROXIDE-SIMETH 200-200-20 MG/5ML PO SUSP: 15.0000 mL | ORAL | 0 refills | Status: DC | PRN

## 2022-11-22 MED ORDER — ASCORBIC ACID 1000 MG PO TABS: 1000.0000 mg | ORAL_TABLET | Freq: Every day | ORAL | 0 refills | Status: AC

## 2022-11-22 MED ORDER — HYDROMORPHONE HCL 2 MG PO TABS: 2.0000 mg | ORAL_TABLET | ORAL | 0 refills | Status: DC | PRN

## 2022-11-22 MED ORDER — OMEPRAZOLE 40 MG PO CPDR: 40.0000 mg | DELAYED_RELEASE_CAPSULE | Freq: Every day | ORAL | 0 refills | Status: DC

## 2022-11-22 MED ORDER — LEVEMIR FLEXTOUCH 100 UNIT/ML ~~LOC~~ SOPN: 4.0000 [IU] | PEN_INJECTOR | Freq: Two times a day (BID) | SUBCUTANEOUS | 0 refills | Status: DC

## 2022-11-22 MED ORDER — ADULT MULTIVITAMIN W/MINERALS CH: 1.0000 | ORAL_TABLET | Freq: Every day | ORAL | 0 refills | Status: AC

## 2022-11-22 MED ORDER — TRAZODONE HCL 50 MG PO TABS: 50.0000 mg | ORAL_TABLET | Freq: Every evening | ORAL | 0 refills | Status: DC | PRN

## 2022-11-22 MED ORDER — FENTANYL 50 MCG/HR TD PT72: 1.0000 | MEDICATED_PATCH | TRANSDERMAL | 0 refills | Status: DC

## 2022-11-22 MED ORDER — DULOXETINE HCL 60 MG PO CPEP: 60.0000 mg | ORAL_CAPSULE | Freq: Every day | ORAL | 0 refills | Status: DC

## 2022-11-22 MED ORDER — METOPROLOL TARTRATE 25 MG PO TABS: 25.0000 mg | ORAL_TABLET | Freq: Two times a day (BID) | ORAL | 0 refills | Status: DC

## 2022-11-22 MED ORDER — ACETAMINOPHEN 325 MG PO TABS: 650.0000 mg | ORAL_TABLET | Freq: Four times a day (QID) | ORAL | 0 refills | Status: DC | PRN

## 2022-11-22 MED ORDER — METHOCARBAMOL 1000 MG PO TABS: 1000.0000 mg | ORAL_TABLET | Freq: Three times a day (TID) | ORAL | 0 refills | Status: DC

## 2022-11-22 MED ORDER — JUVEN PO PACK: 1.0000 | PACK | Freq: Two times a day (BID) | ORAL | 0 refills | Status: DC

## 2022-11-22 MED ORDER — METOCLOPRAMIDE HCL 10 MG PO TABS: 10.0000 mg | ORAL_TABLET | Freq: Three times a day (TID) | ORAL | 0 refills | Status: DC

## 2022-11-22 MED ORDER — BISACODYL 10 MG RE SUPP: 10.0000 mg | Freq: Every day | RECTAL | 0 refills | Status: DC | PRN

## 2022-11-22 MED ORDER — HYDROCORTISONE 1 % EX CREA: TOPICAL_CREAM | Freq: Three times a day (TID) | CUTANEOUS | 0 refills | Status: DC

## 2022-11-22 MED ORDER — ZINC SULFATE 220 (50 ZN) MG PO CAPS: 220.0000 mg | ORAL_CAPSULE | Freq: Every day | ORAL | 0 refills | Status: DC

## 2022-11-22 NOTE — Discharge Summary (Signed)
Physician Discharge Summary  Kelli Hudson ZOX:[id number] DOB: [birth year] DOA: 10/03/2022  PCP: Norm Salt, PA  Admit date: 10/03/2022 Discharge date: 11/22/2022  Admitted From: Home Disposition:  SNF  Recommendations for Outpatient Follow-up:  Follow up with PCP in 1-2 weeks Follow up with orthopedic surgery as scheduled Repeat labs in 1 week and consider restarting at low dose if K rises again. Midodrine discontinued - if BP rises would restart prior home BP meds in the next 1-2 weeks  Discharge Condition:Stable  CODE STATUS:Full  Diet recommendation: As tolerated diabetic diet    Brief/Interim Summary: 32 y.o. female with medical history significant of DM1, diabetic gastroparesis with very frequent admissions in the past, HTN presents to the hospital with bilateral foot wounds for about 2 weeks.  Not relieved with oral antibiotics.  She was admitted and treated with IV antibiotics but ultimately required extensive debridement and subsequent amputations for necrotizing fasciitis.   Chronology of events as below.   Since her surgery intermittently having anemia without any obvious signs of blood loss requiring PRBC transfusion, WBC still remains elevated but stable. Repeat CT showed persistent fluid collection, s/p OR 9/18, and again on 9/20 for repeat debridement. Polymicrobial infection.   Sx and multiple revisions/debridements on 9/18, 19, 25, 26, 9/25 back to the OR for more debridement.  Nonviable tissue on below-knee amputation stumps and thigh wounds.   9/27, back to OR for revision. Remains in the hospital, postop recovery, recurrent anemia.  Currently pain managed with Dilaudid PCA.   9/30, some clinical stabilization now.  WBC trending down. 10/3 PICC removed -continue to wean IV narcotics down (Discontinued 11/22/22) 10/5 Foley removed - IV narcotics off  Medically stable for discharge - accepting facility found - family/patient notified. Otherwise stable and  agreeable for discharge.  Discharge Diagnoses:  Principal Problem:   Necrotizing fasciitis (HCC) Active Problems:   AKI (acute kidney injury) (HCC)   Sepsis due to cellulitis (HCC)   Cellulitis of lower extremity   Anemia   Diabetic gastroparesis associated with type 1 diabetes mellitus (HCC)   DM type 1 (diabetes mellitus, type 1) (HCC)   Drug-seeking behavior   Sepsis (HCC)   Necrotizing fasciitis of pelvic region and thigh (HCC)   Necrotizing fasciitis of lower leg (HCC)   MDD (major depressive disorder), recurrent episode, moderate (HCC)   Effusion, left knee   Severe sepsis due to bilateral leg abscess, necrotizing fasciitis, POA, resolved 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 -I&D 8/21 -Bilateral BKA on 9/6 -Back to OR and multiple debridements 9/18, 9/19, 9/25.  Amputation revisions. 9/18, culture with steno and Klebsiella, MDR ESBL and rare yeast 9/20, surgical culture with Klebsiella and steno.   9/27, revision and bilateral above-knee amputations. Surgical margins reported to be clear after bilateral above-knee amputation.   9/30 Completed all antibiotics Pain regimen currently:  -Fentanyl patch increased to 50 - tolerating well -PO Dilaudid 2 to 4 mg every 3 hours ongoing,consider weaning to q4-6h in the next week as tolerated.   Hypotension with tachycardia History of hypertension -Midodrine discontinued as blood pressure continues to improve -Consider restarting prior home BP meds in the next 24-48h if blood pressure continues to climb.  Acute blood loss anemia due to operative blood loss -Patient received total of 17 units of blood transfusions.   -Hemoglobin stabilizing - no signs/symptoms of bleeding.   Type 1 diabetes, poorly controlled with intermittent hypoglycemia Continue long acting/sliding scale insulin - titrate as appropriate  Acute kidney injury on CKD stage IIIa due to septic ATN, improving Non-anion gap  metabolic acidosis, hyperkalemia Renal function stabilizing,  Lokelma discontinued 10/4- K stable - repeat labs in 1 week and consider restarting at low dose if K rises again.   Severe protein calorie malnutrition Encouraging oral diet, dietitian following.  Encourage oral diet Fortunately, she does not have gastroparesis symptoms these days and able to eat regular diet.  Occasionally using nausea medicine.   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  Currently not having active symptoms.  Occasionally requires nausea medicine.   Discharge Instructions  Discharge Instructions     Ambulatory referral to Physical Medicine Rehab   Complete by: As directed    Patient with Bilateral BKA's, chronic pain from gastroparesis. Will need follow up for ongoing support and management.      Allergies as of 11/22/2022       Reactions   Ibuprofen Other (See Comments)   07/23/22 - Worsening kidney function after 3 doses of ibuprofen 400mg .  Creatinine increased from 1.3 to 2.  Creatinine returned to normal after stopping.   Lisinopril Cough, Other (See Comments), Itching, Rash   Breakout in rash        Medication List     STOP taking these medications    carvedilol 12.5 MG tablet Commonly known as: COREG   dicyclomine 20 MG tablet Commonly known as: BENTYL   doxycycline 100 MG tablet Commonly known as: VIBRA-TABS   HYDROcodone-acetaminophen 5-325 MG tablet Commonly known as: NORCO/VICODIN   IRON PO   losartan 25 MG tablet Commonly known as: COZAAR   NovoLIN R FlexPen ReliOn 100 UNIT/ML FlexPen Generic drug: Insulin Regular Human   ondansetron 4 MG tablet Commonly known as: ZOFRAN   pantoprazole 40 MG tablet Commonly known as: PROTONIX   silver sulfADIAZINE 1 % cream Commonly known as: Silvadene   Transderm-Scop 1 MG/3DAYS Generic drug: scopolamine       TAKE these medications    acetaminophen 325 MG tablet Commonly known as: TYLENOL Take 2  tablets (650 mg total) by mouth every 6 (six) hours as needed for mild pain, fever or headache.   alum & mag hydroxide-simeth 200-200-20 MG/5ML suspension Commonly known as: MAALOX/MYLANTA Take 15-30 mLs by mouth every 2 (two) hours as needed for indigestion.   ascorbic acid 1000 MG tablet Commonly known as: VITAMIN C Take 1 tablet (1,000 mg total) by mouth daily.   atorvastatin 20 MG tablet Commonly known as: LIPITOR Take 1 tablet (20 mg total) by mouth daily. Notes to patient: Take as you were prior to admission    bisacodyl 5 MG EC tablet Commonly known as: DULCOLAX Take 1 tablet (5 mg total) by mouth daily as needed for moderate constipation.   bisacodyl 10 MG suppository Commonly known as: DULCOLAX Place 1 suppository (10 mg total) rectally daily as needed for moderate constipation.   Dexcom G6 Sensor Misc 1 Device by Does not apply route as directed. Notes to patient: Use as you were prior to admission    Dexcom G6 Transmitter Misc 1 Device by Does not apply route as directed. What changed: Another medication with the same name was removed. Continue taking this medication, and follow the directions you see here.   DULoxetine 60 MG capsule Commonly known as: CYMBALTA Take 1 capsule (60 mg total) by mouth daily. What changed:  medication strength how much to take   feeding supplement (GLUCERNA SHAKE) Liqd Take 237 mLs by mouth 3 (three)  times daily between meals.   nutrition supplement (JUVEN) Pack Take 1 packet by mouth 2 (two) times daily between meals.   fentaNYL 50 MCG/HR Commonly known as: DURAGESIC Place 1 patch onto the skin every 3 (three) days. Start taking on: November 23, 2022   gabapentin 300 MG capsule Commonly known as: NEURONTIN Take 1 capsule (300 mg total) by mouth every 8 (eight) hours. What changed: when to take this   hydrocortisone cream 1 % Apply topically 3 (three) times daily.   HYDROmorphone 2 MG tablet Commonly known as:  DILAUDID Take 1-2 tablets (2-4 mg total) by mouth every 3 (three) hours as needed for severe pain or moderate pain.   Insulin Pen Needle 32G X 4 MM Misc 1 Device by Does not apply route in the morning, at noon, in the evening, and at bedtime.   Levemir FlexTouch 100 UNIT/ML FlexTouch Pen Generic drug: insulin detemir Inject 4 Units into the skin 2 (two) times daily. What changed: how much to take   loperamide 2 MG capsule Commonly known as: IMODIUM Take 2 capsules (4 mg total) by mouth every 6 (six) hours as needed for diarrhea or loose stools.   MAGNESIUM PO Take 1 tablet by mouth daily.   Methocarbamol 1000 MG Tabs Take 1,000 mg by mouth 3 (three) times daily.   metoCLOPramide 10 MG tablet Commonly known as: REGLAN Take 1 tablet (10 mg total) by mouth every 8 (eight) hours. What changed:  when to take this reasons to take this   metoprolol tartrate 25 MG tablet Commonly known as: LOPRESSOR Take 1 tablet (25 mg total) by mouth 2 (two) times daily.   multivitamin with minerals Tabs tablet Take 1 tablet by mouth daily.   Nexplanon 68 MG Impl implant Generic drug: etonogestrel 1 each by Subdermal route continuous.   omeprazole 40 MG capsule Commonly known as: PRILOSEC Take 1 capsule (40 mg total) by mouth daily.   ondansetron 8 MG disintegrating tablet Commonly known as: ZOFRAN-ODT Take 8 mg by mouth as needed for nausea or vomiting.   OVER THE COUNTER MEDICATION Take 1 tablet by mouth daily. Nervive Notes to patient: Take as you were prior to admission    polyethylene glycol 17 g packet Commonly known as: MIRALAX / GLYCOLAX Take 17 g by mouth daily as needed for mild constipation.   sucralfate 1 g tablet Commonly known as: CARAFATE Take 1 g by mouth in the morning, at noon, and at bedtime.   traZODone 50 MG tablet Commonly known as: DESYREL Take 1 tablet (50 mg total) by mouth at bedtime as needed for sleep.   zinc sulfate 220 (50 Zn) MG capsule Take 1  capsule (220 mg total) by mouth daily for 6 days.        Follow-up Information     Nadara Mustard, MD Follow up in 1 week(s).   Specialty: Orthopedic Surgery Contact information: [address] [place] [address] [phone]                Allergies  Allergen Reactions   Ibuprofen Other (See Comments)    07/23/22 - Worsening kidney function after 3 doses of ibuprofen 400mg .  Creatinine increased from 1.3 to 2.  Creatinine returned to normal after stopping.   Lisinopril Cough, Other (See Comments), Itching and Rash    Breakout in rash    Consultations: Podiatry, orthopedic surgery, pccm   Procedures/Studies: CT FEMUR RIGHT WO CONTRAST  Result Date: 11/01/2022 CLINICAL DATA:  Soft tissue  infection, below the knee amputation 8 days ago EXAM: CT OF THE RIGHT THIGH WITHOUT CONTRAST TECHNIQUE: Multidetector CT imaging of the right lower extremity was performed according to the standard protocol. RADIATION DOSE REDUCTION: This exam was performed according to the departmental dose-optimization program which includes automated exposure control, adjustment of the mA and/or kV according to patient size and/or use of iterative reconstruction technique. COMPARISON:  None Available. FINDINGS: Bones/Joint/Cartilage No bony destructive findings. No hip joint effusion. Small knee joint effusion. Ligaments Suboptimally assessed by CT. Muscles and Tendons As noted in the CT tibia/fibular report, there is a somewhat hyperdense collection tracking along the left side sartorius muscle, left medial knee, and left posterolateral calf, probably a hematoma, but with some areas of linear extension to the cutaneous surface for example on image 277 of series 4, which could represent draining process. There is a tiny loculation of gas in this process just below the knee joint. Cannot exclude superimposed infection of hematoma. Overall volume of this process is about 450 cc when combining the thigh and calf  components. Generalized soft tissue edema in the thigh includes low-level edema tracking along fascia planes. Soft tissues Diffuse subcutaneous edema in the right thigh. There is also some localized subcutaneous edema laterally in the mid thigh tracking towards the cutaneous surface for example on image 182 of series 4, likely simply from confluent edema along the superficial fascia margin. This does not appear hyperdense and is less likely to be a hematoma. Atheromatous vascular calcifications noted. IMPRESSION: 1. Hyperdense collection tracking along the left side of the sartorius muscle, left medial knee, and left posterolateral calf, probably a hematoma, but with some areas of linear extension to the cutaneous surface for example which could represent draining process. There is a tiny loculation of gas in this process just below the knee joint. Cannot exclude superimposed infection of hematoma. Overall volume of this process is about 450 cc when combining the thigh and calf components, as reported on the tibia/fibula CT. 2. Generalized soft tissue edema in the right thigh including low-level edema tracking along fascia planes. 3. Small knee joint effusion. 4. Atheromatous vascular calcifications. Electronically Signed   By: Gaylyn Rong M.D.   On: 11/01/2022 13:59   CT FEMUR LEFT WO CONTRAST  Result Date: 11/01/2022 CLINICAL DATA:  Recent below the knee amputation, concern for soft tissue infection EXAM: CT OF THE LOWER LEFT THIGH WITHOUT CONTRAST TECHNIQUE: Multidetector CT imaging of the lower left extremity was performed according to the standard protocol. RADIATION DOSE REDUCTION: This exam was performed according to the departmental dose-optimization program which includes automated exposure control, adjustment of the mA and/or kV according to patient size and/or use of iterative reconstruction technique. COMPARISON:  Left tibia/fibula CT of 11/01/2022 FINDINGS: Bones/Joint/Cartilage No bony  destructive findings along the femur to indicate osteomyelitis. As noted on the prior tibia/fibula CT, there is a knee effusion with a trace amount of gas internally, probably related to previous aspiration of the left knee given the lack of associated synovitis. Ligaments Suboptimally assessed by CT. Muscles and Tendons There is likely some low-grade edema tracking along fascia planes in the thigh, although not disproportionate to the subcutaneous edema. Apparent expansion of the distal vastus medialis with a small amount of density extending to the cutaneous surface for example on image 267 of series 2, possibly reflecting local hematoma along the superficial fascia margin, or less likely a small draining infection. I do not see significant gas in the soft tissues nor  is they a obvious abscess. Hypodensity suggesting fluid or hematoma tracks between the distal vastus medialis and sartorius muscles on image 305 of series 2, and along the medial knee. Soft tissues Diffuse subcutaneous edema noted along the pelvis, perineum, and left thigh. IMPRESSION: 1. No specific findings of osteomyelitis. 2. Expansion of the distal vastus medialis with a small amount of density extending to the cutaneous surface, possibly reflecting local hematoma along the superficial fascia margin, or less likely a small draining infection. I do not see significant gas in the soft tissues nor is there a obvious abscess. 3. Hypodensity suggesting fluid or hematoma tracks between the distal vastus medialis and sartorius muscles, and along the medial knee. 4. Diffuse subcutaneous edema along the pelvis, perineum, and left thigh. 5. Knee effusion with a trace amount of gas internally, probably related to previous aspiration of the left knee given the lack of associated synovitis. Electronically Signed   By: Gaylyn Rong M.D.   On: 11/01/2022 13:53   CT TIBIA FIBULA RIGHT WO CONTRAST  Result Date: 11/01/2022 CLINICAL DATA:  Bilateral  below-the-knee amputations 8 days ago, soft tissue swelling/mass EXAM: CT OF THE LOWER RIGHT EXTREMITY WITHOUT CONTRAST TECHNIQUE: Multidetector CT imaging of the right lower extremity was performed according to the standard protocol. RADIATION DOSE REDUCTION: This exam was performed according to the departmental dose-optimization program which includes automated exposure control, adjustment of the mA and/or kV according to patient size and/or use of iterative reconstruction technique. COMPARISON:  None Available. FINDINGS: Bones/Joint/Cartilage Right below-the-knee amputation noted about 13.8 cm distal to the tibial plateau. No current findings of osteomyelitis or unexpected bony abnormality. Trace knee effusion. Ligaments Suboptimally assessed by CT. Muscles and Tendons Tracking especially along the distal sartorius muscle and posterolateral to the knee and along the calf, we demonstrate a 7.2 by 3.6 by 33.2 cm (volume = 450 cm^3) mildly hyperdense collection favoring hematoma. In order to arrive at the vertical length, I include some of the CT scan of the thigh. This collection has some components extending to the cutaneous surface especially in the thigh region, and just below the level of the knee joint has some small gas loculations internally as on image 71 series 2. Strictly speaking, infection of this collection is not excluded and drainage to the cutaneous surface is not excluded. However, the hyperdensity favors blood products. This tracks around to the bottom of the stump medially. The hematoma also tracks along the superficial margin of the medial head gastrocnemius in the calf. Indistinctness of fat stranding in the anterior and posterior compartment of the calf suggesting edema. Soft tissues Cutaneous thickening and mild subcutaneous edema are present especially distally along the stump, not unexpected in the postoperative setting. IMPRESSION: 1. 450 cubic cm mildly hyperdense collection tracking  along the superficial fascia margin of the sartorius muscle in the thigh and calf, and posterolateral to the knee and along the calf, favoring hematoma. This collection has some components extending to the cutaneous surface especially in the thigh region, and just below the level of the knee joint has some small gas loculations internally. Strictly speaking, infection of this collection is not excluded and drainage to the cutaneous surface is not excluded. However, the hyperdensity favors blood products. 2. Indistinctness of fat stranding in the anterior and posterior compartment of the calf suggesting edema. 3. Trace knee effusion. Electronically Signed   By: Gaylyn Rong M.D.   On: 11/01/2022 13:46   CT TIBIA FIBULA LEFT WO CONTRAST  Result Date:  11/01/2022 CLINICAL DATA:  Bilateral below the knee amputation 10/24/2022. Soft tissue mass/swelling. EXAM: CT OF THE LOWER LEFT TIBIA/FIBULA WITHOUT CONTRAST TECHNIQUE: Multidetector CT imaging of the lower left extremity was performed according to the standard protocol. Imaging was performed from the knee through the end of the stump. RADIATION DOSE REDUCTION: This exam was performed according to the departmental dose-optimization program which includes automated exposure control, adjustment of the mA and/or kV according to patient size and/or use of iterative reconstruction technique. COMPARISON:  None Available. FINDINGS: Bones/Joint/Cartilage Below the knee amputation observed with the tibia and fibula amputated about 12.8 cm below the tibial plateau. No current bony destructive findings to indicate osteomyelitis. Small knee effusion noted, miniscule loculation of gas in the knee effusion on image 38 series 2. Although septic joint is not completely excluded, there does not seem to be a lot of synovitis along the knee effusion. If the patient has had recent knee arthrocentesis than this tiny loculation of gas could be iatrogenic. Ligaments Suboptimally  assessed by CT. Muscles and Tendons Low-grade edema tracks along muscle groups in the anterior and posterior compartments of the calf, especially distally near the end of the stump. Trace loculations of gas in the anterior and posterior compartment on image 153 of series 2, this is near the into the stump and could conceivably represent residual postoperative gas 8 days out from surgery. Infection is a differential diagnostic consideration but the appearance does not clearly represent abscess. Soft tissues Wound VAC noted anteriorly along the distal stump margin with skin staples noted transversely along the anterior stump margin. Cutaneous thickening is present circumferentially but more notably anteriorly likewise circumferential subcutaneous edema is present although with some posterior sparing. IMPRESSION: 1. Below the knee amputation with wound VAC along the anterior stump margin. 2. Low-grade edema tracks along muscle groups in the anterior and posterior compartments of the calf, especially distally near the end of the stump. Trace loculations of gas in the anterior and posterior compartment near the end of the stump could conceivably represent residual postoperative gas 8 days out from surgery. Infection is a differential diagnostic consideration but the appearance does not clearly represent abscess. 3. Small knee effusion with miniscule loculation of gas in the knee effusion. Although septic joint is not completely excluded, there does not seem to be a lot of synovitis along the knee effusion. If the patient has had recent knee arthrocentesis than this tiny loculation of gas could be iatrogenic. Otherwise, consider knee arthrocentesis to exclude early septic joint. 4. Circumferential cutaneous thickening and subcutaneous edema, with some posterior sparing. Electronically Signed   By: Gaylyn Rong M.D.   On: 11/01/2022 13:37   PERIPHERAL VASCULAR CATHETERIZATION  Result Date: 10/24/2022 See surgical  note for result.    Subjective: No acute issues/events overnight   Discharge Exam: Vitals:   11/22/22 0340 11/22/22 0812  BP: (!) 156/93 (!) 149/96  Pulse: 85 97  Resp: 14 14  Temp: 98.4 F (36.9 C) 98.1 F (36.7 C)  SpO2: 100% 98%   Vitals:   11/21/22 2049 11/21/22 2319 11/22/22 0340 11/22/22 0812  BP: 130/76 136/89 (!) 156/93 (!) 149/96  Pulse: (!) 104 (!) 101 85 97  Resp: 16 17 14 14   Temp: 98.4 F (36.9 C) 98.4 F (36.9 C) 98.4 F (36.9 C) 98.1 F (36.7 C)  TempSrc: Oral Oral Oral Oral  SpO2: 100% 100% 100% 98%  Weight:      Height:        General:  Pleasantly resting in bed, No acute distress. HEENT: Normocephalic atraumatic.  Sclerae nonicteric, noninjected.  Extraocular movements intact bilaterally. Neck: Without mass or deformity.  Trachea is midline. Lungs: Clear to auscultate bilaterally without rhonchi, wheeze, or rales. Heart: Regular rate and rhythm.  Without murmurs, rubs, or gallops. Abdomen: Soft, nontender, nondistended.  Without guarding or rebound. Extremities: Bilateral AKA    The results of significant diagnostics from this hospitalization (including imaging, microbiology, ancillary and laboratory) are listed below for reference.     Microbiology: Recent Results (from the past 240 hour(s))  Aerobic/Anaerobic Culture w Gram Stain (surgical/deep wound)     Status: None   Collection Time: 11/12/22  4:46 PM   Specimen: Soft Tissue, Other  Result Value Ref Range Status   Specimen Description TISSUE  Final   Special Requests LEFT THIGH WD  Final   Gram Stain   Final    FEW WBC PRESENT,BOTH PMN AND MONONUCLEAR NO ORGANISMS SEEN    Culture   Final    No growth aerobically or anaerobically. Performed at [facility], [address], [place], [place] [address]    Report Status 11/17/2022 FINAL  Final  Aerobic/Anaerobic Culture w Gram Stain (surgical/deep wound)     Status: None   Collection Time: 11/12/22  4:52 PM   Specimen: Soft  Tissue, Other  Result Value Ref Range Status   Specimen Description TISSUE  Final   Special Requests RIGHT THIGH WD SPEC B  Final   Gram Stain   Final    FEW WBC PRESENT,BOTH PMN AND MONONUCLEAR NO ORGANISMS SEEN    Culture   Final    No growth aerobically or anaerobically. Performed at [facility], [address] [place], [place] [address]    Report Status 11/17/2022 FINAL  Final     Labs: BNP (last 3 results) No results for input(s): "BNP" in the last 8760 hours. Basic Metabolic Panel: Recent Labs  Lab 11/18/22 0750 11/19/22 0605 11/20/22 0808 11/21/22 0830 11/22/22 0420  NA 133* 134* 133* 132* 137  K 5.7* 5.2* 4.8 4.5 4.4  CL 109 108 107 107 109  CO2 20* 21* 19* 19* 21*  GLUCOSE 107* 72 90 162* 178*  BUN 31* 28* 30* 35* 36*  CREATININE 1.07* 0.95 0.93 0.72 0.87  CALCIUM 7.7* 7.9* 7.7* 7.7* 7.8*   Liver Function Tests: Recent Labs  Lab 11/17/22 0520  AST 28  ALT 20  ALKPHOS 773*  BILITOT 0.3  PROT 5.1*  ALBUMIN <1.5*   No results for input(s): "LIPASE", "AMYLASE" in the last 168 hours. No results for input(s): "AMMONIA" in the last 168 hours. CBC: Recent Labs  Lab 11/17/22 0520 11/18/22 0750 11/19/22 0605 11/20/22 0808 11/22/22 0420  WBC 18.4* 19.5* 18.4* 19.5* 15.3*  NEUTROABS  --  13.7* 12.7* 14.8* 11.0*  HGB 8.4* 8.2* 8.8* 8.4* 8.2*  HCT 25.3* 24.9* 26.2* 25.4* 24.9*  MCV 91.7 92.2 91.3 90.4 94.3  PLT 475* 526* 575* 585* 588*   Cardiac Enzymes: No results for input(s): "CKTOTAL", "CKMB", "CKMBINDEX", "TROPONINI" in the last 168 hours. BNP: Invalid input(s): "POCBNP" CBG: Recent Labs  Lab 11/21/22 0811 11/21/22 1147 11/21/22 1614 11/21/22 2047 11/22/22 0543  GLUCAP 74 166* 71 170* 198*   D-Dimer No results for input(s): "DDIMER" in the last 72 hours. Hgb A1c No results for input(s): "HGBA1C" in the last 72 hours. Lipid Profile No results for input(s): "CHOL", "HDL", "LDLCALC", "TRIG", "CHOLHDL", "LDLDIRECT" in the last 72  hours. Thyroid  function studies No results for input(s): "TSH", "T4TOTAL", "T3FREE", "THYROIDAB" in the last 72 hours.  Invalid input(s): "FREET3" Anemia work up No results for input(s): "VITAMINB12", "FOLATE", "FERRITIN", "TIBC", "IRON", "RETICCTPCT" in the last 72 hours. Urinalysis    Component Value Date/Time   COLORURINE YELLOW 05/05/2022 1416   APPEARANCEUR CLEAR 05/05/2022 1416   LABSPEC 1.016 05/05/2022 1416   PHURINE 6.0 05/05/2022 1416   GLUCOSEU >=500 (A) 05/05/2022 1416   GLUCOSEU NEGATIVE 05/22/2020 1417   HGBUR SMALL (A) 05/05/2022 1416   BILIRUBINUR NEGATIVE 05/05/2022 1416   KETONESUR NEGATIVE 05/05/2022 1416   PROTEINUR >=300 (A) 05/05/2022 1416   UROBILINOGEN 0.2 05/22/2020 1417   NITRITE NEGATIVE 05/05/2022 1416   LEUKOCYTESUR NEGATIVE 05/05/2022 1416   Sepsis Labs Recent Labs  Lab 11/18/22 0750 11/19/22 0605 11/20/22 0808 11/22/22 0420  WBC 19.5* 18.4* 19.5* 15.3*   Microbiology Recent Results (from the past 240 hour(s))  Aerobic/Anaerobic Culture w Gram Stain (surgical/deep wound)     Status: None   Collection Time: 11/12/22  4:46 PM   Specimen: Soft Tissue, Other  Result Value Ref Range Status   Specimen Description TISSUE  Final   Special Requests LEFT THIGH WD  Final   Gram Stain   Final    FEW WBC PRESENT,BOTH PMN AND MONONUCLEAR NO ORGANISMS SEEN    Culture   Final    No growth aerobically or anaerobically. Performed at [facility], [address], [place], [place] [address]    Report Status 11/17/2022 FINAL  Final  Aerobic/Anaerobic Culture w Gram Stain (surgical/deep wound)     Status: None   Collection Time: 11/12/22  4:52 PM   Specimen: Soft Tissue, Other  Result Value Ref Range Status   Specimen Description TISSUE  Final   Special Requests RIGHT THIGH WD SPEC B  Final   Gram Stain   Final    FEW WBC PRESENT,BOTH PMN AND MONONUCLEAR NO ORGANISMS SEEN    Culture   Final    No growth aerobically or  anaerobically. Performed at [facility], [address] [place], [place] [address]    Report Status 11/17/2022 FINAL  Final     Time coordinating discharge: Over 30 minutes  SIGNED:   Azucena Fallen, DO [facility] 11/22/2022, 11:19 AM Pager   If 7PM-7AM, please contact night-coverage [email or web address]

## 2022-11-22 NOTE — TOC Transition Note (Signed)
Transition of Care [facility]) - CM/SW Discharge Note   Patient Details  Name: Kelli Hudson MRN: [id number] Date of Birth: Nov 28, 1990  Transition of Care [facility]) CM/SW Contact:  Patrice Paradise, LCSW Phone Number: [phone] 11/22/2022, 11:29 AM   Clinical Narrative:     Patient will DC to: [facility]? Anticipated DC date:?11/22/2022 Family notified:?Robbie Transport by: Sharin Mons   Per MD patient ready for DC to [facility]. RN, patient, patient's family, and facility notified of DC. Discharge Summary sent to facility. RN given number for report  [phone].. DC packet on chart. Ambulance transport requested for patient.   CSW signing off.   Judd Lien, [place] [phone]   Final next level of care: Skilled Nursing Facility Barriers to Discharge: Barriers Resolved   Patient Goals and CMS Choice   Choice offered to / list presented to : Patient, Parent (mother [name])  Discharge Placement                Patient chooses bed at:  [facility]) Patient to be transferred to facility by: [facility] Name of family member notified: Robbie Patient and family notified of of transfer: 11/22/22  Discharge Plan and Services Additional resources added to the After Visit Summary for   In-house Referral: Clinical Social Work   Post Acute Care Choice: Skilled Nursing Facility                               Social Determinants of Health (SDOH) Interventions SDOH Screenings   Food Insecurity: No Food Insecurity (10/03/2022)  Housing: Low Risk  (10/03/2022)  Transportation Needs: No Transportation Needs (10/03/2022)  Utilities: Not At Risk (10/03/2022)  Alcohol Screen: Low Risk  (05/29/2021)  Depression (PHQ2-9): Low Risk  (05/29/2021)  Recent Concern: Depression (PHQ2-9) - Medium Risk (04/10/2021)  Financial Resource Strain: Low Risk  (02/13/2022)   Received from [facility], [facility]  Social Connections: Unknown (06/17/2021)   Received from [facility], [facility]  Stress: No Stress Concern Present (09/16/2022)   Received from [facility]  Tobacco Use: Low Risk  (11/14/2022)     Readmission Risk Interventions    05/08/2022    9:45 AM 03/24/2022    3:33 PM 12/12/2021    8:59 AM  Readmission Risk Prevention Plan  Transportation Screening Complete Complete Complete  Medication Review (RN Care Manager) Complete Complete Complete  PCP or Specialist appointment within 3-5 days of discharge Complete Complete Complete  HRI or Home Care Consult Complete Complete Complete  SW Recovery Care/Counseling Consult Complete Complete Complete  Palliative Care Screening Not Applicable Not Applicable Not Applicable  Skilled Nursing Facility Not Applicable Not Applicable Not Applicable

## 2022-11-22 NOTE — Plan of Care (Signed)
Problem: Education: Goal: Knowledge of General Education information will improve Description: Including pain rating scale, medication(s)/side effects and non-pharmacologic comfort measures 11/22/2022 1238 by Milderd Meager, RN Outcome: Adequate for Discharge 11/22/2022 0950 by Milderd Meager, RN Outcome: Progressing   Problem: Health Behavior/Discharge Planning: Goal: Ability to manage health-related needs will improve Outcome: Adequate for Discharge   Problem: Clinical Measurements: Goal: Ability to maintain clinical measurements within normal limits will improve Outcome: Adequate for Discharge Goal: Will remain free from infection Outcome: Adequate for Discharge Goal: Diagnostic test results will improve Outcome: Adequate for Discharge Goal: Respiratory complications will improve Outcome: Adequate for Discharge Goal: Cardiovascular complication will be avoided Outcome: Adequate for Discharge   Problem: Activity: Goal: Risk for activity intolerance will decrease 11/22/2022 1238 by Milderd Meager, RN Outcome: Adequate for Discharge 11/22/2022 0950 by Milderd Meager, RN Outcome: Progressing   Problem: Activity: Goal: Risk for activity intolerance will decrease 11/22/2022 1238 by Milderd Meager, RN Outcome: Adequate for Discharge 11/22/2022 0950 by Milderd Meager, RN Outcome: Progressing   Problem: Nutrition: Goal: Adequate nutrition will be maintained 11/22/2022 1238 by Milderd Meager, RN Outcome: Adequate for Discharge 11/22/2022 0950 by Milderd Meager, RN Outcome: Progressing   Problem: Coping: Goal: Level of anxiety will decrease 11/22/2022 1238 by Milderd Meager, RN Outcome: Adequate for Discharge 11/22/2022 0950 by Milderd Meager, RN Outcome: Progressing   Problem: Elimination: Goal: Will not experience complications related to bowel motility Outcome: Adequate for Discharge Goal: Will not experience complications related to urinary retention Outcome:  Adequate for Discharge   Problem: Pain Managment: Goal: General experience of comfort will improve 11/22/2022 1238 by Milderd Meager, RN Outcome: Adequate for Discharge 11/22/2022 0950 by Milderd Meager, RN Outcome: Progressing   Problem: Safety: Goal: Ability to remain free from injury will improve 11/22/2022 1238 by Milderd Meager, RN Outcome: Adequate for Discharge 11/22/2022 0950 by Milderd Meager, RN Outcome: Progressing   Problem: Skin Integrity: Goal: Risk for impaired skin integrity will decrease Outcome: Adequate for Discharge   Problem: Education: Goal: Ability to describe self-care measures that may prevent or decrease complications (Diabetes Survival Skills Education) will improve Outcome: Adequate for Discharge   Problem: Coping: Goal: Ability to adjust to condition or change in health will improve Outcome: Adequate for Discharge   Problem: Fluid Volume: Goal: Ability to maintain a balanced intake and output will improve Outcome: Adequate for Discharge   Problem: Health Behavior/Discharge Planning: Goal: Ability to identify and utilize available resources and services will improve Outcome: Adequate for Discharge Goal: Ability to manage health-related needs will improve Outcome: Adequate for Discharge   Problem: Metabolic: Goal: Ability to maintain appropriate glucose levels will improve Outcome: Adequate for Discharge   Problem: Nutritional: Goal: Maintenance of adequate nutrition will improve Outcome: Adequate for Discharge Goal: Progress toward achieving an optimal weight will improve Outcome: Adequate for Discharge   Problem: Skin Integrity: Goal: Risk for impaired skin integrity will decrease Outcome: Adequate for Discharge   Problem: Tissue Perfusion: Goal: Adequacy of tissue perfusion will improve Outcome: Adequate for Discharge   Problem: Education: Goal: Knowledge of the prescribed therapeutic regimen will improve Outcome: Adequate for  Discharge Goal: Ability to verbalize activity precautions or restrictions will improve Outcome: Adequate for Discharge Goal: Understanding of discharge needs will improve Outcome: Adequate for Discharge   Problem: Activity: Goal: Ability to perform//tolerate increased activity and mobilize with assistive devices will improve Outcome: Adequate for Discharge   Problem: Clinical Measurements:  Goal: Postoperative complications will be avoided or minimized Outcome: Adequate for Discharge   Problem: Self-Care: Goal: Ability to meet self-care needs will improve Outcome: Adequate for Discharge   Problem: Self-Concept: Goal: Ability to maintain and perform role responsibilities to the fullest extent possible will improve Outcome: Adequate for Discharge   Problem: Pain Management: Goal: Pain level will decrease with appropriate interventions Outcome: Adequate for Discharge   Problem: Skin Integrity: Goal: Demonstration of wound healing without infection will improve Outcome: Adequate for Discharge

## 2022-11-22 NOTE — Plan of Care (Signed)

## 2022-11-22 NOTE — Progress Notes (Signed)
Patient refuses to wear Shrinker on her stumps.

## 2022-11-22 NOTE — Progress Notes (Signed)
Removed PICC line without complications. Patient is going to be staying in the bed for 30 min. Patient understood it well. Educated patient how to take care of dressing ( s/s of infection). Informed patient's RN D/C PICC & staying in the bed. HS [employer]

## 2022-11-22 NOTE — Progress Notes (Signed)
Discharge report called to [facility], report given to receiving nurse Jewel, LPN.  AVS & scripts placed in packet for facility.  Packet given to [facility] for transport.

## 2022-11-26 ENCOUNTER — Emergency Department (HOSPITAL_COMMUNITY): Payer: Medicaid Other

## 2022-11-26 ENCOUNTER — Inpatient Hospital Stay (HOSPITAL_COMMUNITY)
Admission: EM | Admit: 2022-11-26 | Discharge: 2022-12-03 | DRG: 872 | Disposition: A | Discharge status: Skilled Nursing Facility | Payer: Medicaid Other | Source: Skilled Nursing Facility | Attending: Internal Medicine | Admitting: Internal Medicine

## 2022-11-26 ENCOUNTER — Other Ambulatory Visit: Payer: Self-pay

## 2022-11-26 DIAGNOSIS — Z8711 Personal history of peptic ulcer disease: Secondary | ICD-10-CM

## 2022-11-26 DIAGNOSIS — Z833 Family history of diabetes mellitus: Secondary | ICD-10-CM | POA: Diagnosis not present

## 2022-11-26 DIAGNOSIS — R112 Nausea with vomiting, unspecified: Secondary | ICD-10-CM | POA: Diagnosis present

## 2022-11-26 DIAGNOSIS — Z765 Malingerer [conscious simulation]: Secondary | ICD-10-CM

## 2022-11-26 DIAGNOSIS — E1042 Type 1 diabetes mellitus with diabetic polyneuropathy: Secondary | ICD-10-CM | POA: Diagnosis present

## 2022-11-26 DIAGNOSIS — Z8619 Personal history of other infectious and parasitic diseases: Secondary | ICD-10-CM

## 2022-11-26 DIAGNOSIS — A419 Sepsis, unspecified organism: Secondary | ICD-10-CM | POA: Diagnosis not present

## 2022-11-26 DIAGNOSIS — R609 Edema, unspecified: Secondary | ICD-10-CM | POA: Diagnosis present

## 2022-11-26 DIAGNOSIS — E1043 Type 1 diabetes mellitus with diabetic autonomic (poly)neuropathy: Secondary | ICD-10-CM | POA: Diagnosis present

## 2022-11-26 DIAGNOSIS — E1021 Type 1 diabetes mellitus with diabetic nephropathy: Secondary | ICD-10-CM | POA: Diagnosis present

## 2022-11-26 DIAGNOSIS — Z221 Carrier of other intestinal infectious diseases: Secondary | ICD-10-CM | POA: Diagnosis not present

## 2022-11-26 DIAGNOSIS — E876 Hypokalemia: Secondary | ICD-10-CM | POA: Diagnosis present

## 2022-11-26 DIAGNOSIS — E875 Hyperkalemia: Secondary | ICD-10-CM | POA: Diagnosis present

## 2022-11-26 DIAGNOSIS — E10319 Type 1 diabetes mellitus with unspecified diabetic retinopathy without macular edema: Secondary | ICD-10-CM | POA: Diagnosis present

## 2022-11-26 DIAGNOSIS — D638 Anemia in other chronic diseases classified elsewhere: Secondary | ICD-10-CM | POA: Diagnosis present

## 2022-11-26 DIAGNOSIS — E1159 Type 2 diabetes mellitus with other circulatory complications: Secondary | ICD-10-CM | POA: Diagnosis present

## 2022-11-26 DIAGNOSIS — E1065 Type 1 diabetes mellitus with hyperglycemia: Secondary | ICD-10-CM | POA: Diagnosis present

## 2022-11-26 DIAGNOSIS — I10 Essential (primary) hypertension: Secondary | ICD-10-CM | POA: Diagnosis not present

## 2022-11-26 DIAGNOSIS — Z89612 Acquired absence of left leg above knee: Secondary | ICD-10-CM | POA: Diagnosis not present

## 2022-11-26 DIAGNOSIS — A0472 Enterocolitis due to Clostridium difficile, not specified as recurrent: Principal | ICD-10-CM | POA: Diagnosis not present

## 2022-11-26 DIAGNOSIS — Z79899 Other long term (current) drug therapy: Secondary | ICD-10-CM

## 2022-11-26 DIAGNOSIS — Z89611 Acquired absence of right leg above knee: Secondary | ICD-10-CM

## 2022-11-26 DIAGNOSIS — L02419 Cutaneous abscess of limb, unspecified: Secondary | ICD-10-CM | POA: Diagnosis not present

## 2022-11-26 DIAGNOSIS — N182 Chronic kidney disease, stage 2 (mild): Secondary | ICD-10-CM | POA: Diagnosis not present

## 2022-11-26 DIAGNOSIS — Z794 Long term (current) use of insulin: Secondary | ICD-10-CM | POA: Diagnosis not present

## 2022-11-26 DIAGNOSIS — I152 Hypertension secondary to endocrine disorders: Secondary | ICD-10-CM | POA: Diagnosis present

## 2022-11-26 DIAGNOSIS — K219 Gastro-esophageal reflux disease without esophagitis: Secondary | ICD-10-CM | POA: Diagnosis present

## 2022-11-26 DIAGNOSIS — E1069 Type 1 diabetes mellitus with other specified complication: Secondary | ICD-10-CM | POA: Diagnosis present

## 2022-11-26 DIAGNOSIS — E1143 Type 2 diabetes mellitus with diabetic autonomic (poly)neuropathy: Secondary | ICD-10-CM | POA: Diagnosis present

## 2022-11-26 DIAGNOSIS — Z886 Allergy status to analgesic agent status: Secondary | ICD-10-CM

## 2022-11-26 DIAGNOSIS — K3184 Gastroparesis: Secondary | ICD-10-CM | POA: Diagnosis present

## 2022-11-26 DIAGNOSIS — E103593 Type 1 diabetes mellitus with proliferative diabetic retinopathy without macular edema, bilateral: Secondary | ICD-10-CM | POA: Diagnosis not present

## 2022-11-26 DIAGNOSIS — R748 Abnormal levels of other serum enzymes: Secondary | ICD-10-CM | POA: Diagnosis present

## 2022-11-26 DIAGNOSIS — Z793 Long term (current) use of hormonal contraceptives: Secondary | ICD-10-CM

## 2022-11-26 DIAGNOSIS — E1022 Type 1 diabetes mellitus with diabetic chronic kidney disease: Secondary | ICD-10-CM | POA: Diagnosis not present

## 2022-11-26 DIAGNOSIS — Z888 Allergy status to other drugs, medicaments and biological substances status: Secondary | ICD-10-CM

## 2022-11-26 DIAGNOSIS — E109 Type 1 diabetes mellitus without complications: Secondary | ICD-10-CM | POA: Diagnosis present

## 2022-11-26 LAB — C DIFFICILE QUICK SCREEN W PCR REFLEX
C Diff antigen: POSITIVE — AB
C Diff toxin: NEGATIVE

## 2022-11-26 LAB — BLOOD GAS, VENOUS
Acid-base deficit: 1.7 mmol/L (ref 0.0–2.0)
Bicarbonate: 23 mmol/L (ref 20.0–28.0)
Drawn by: 44828
O2 Saturation: 77.8 %
Patient temperature: 36.8
pCO2, Ven: 38 mm[Hg] — ABNORMAL LOW (ref 44–60)
pH, Ven: 7.39 (ref 7.25–7.43)
pO2, Ven: 42 mm[Hg] (ref 32–45)

## 2022-11-26 LAB — LIPASE, BLOOD: Lipase: 22 U/L (ref 11–51)

## 2022-11-26 LAB — COMPREHENSIVE METABOLIC PANEL
ALT: 31 U/L (ref 0–44)
AST: 25 U/L (ref 15–41)
Albumin: <1.5 g/dL — ABNORMAL LOW (ref 3.5–5.0)
Alkaline Phosphatase: 492 U/L — ABNORMAL HIGH (ref 38–126)
Anion gap: 5 (ref 5–15)
BUN: 26 mg/dL — ABNORMAL HIGH (ref 6–20)
CO2: 21 mmol/L — ABNORMAL LOW (ref 22–32)
Calcium: 8.1 mg/dL — ABNORMAL LOW (ref 8.9–10.3)
Chloride: 107 mmol/L (ref 98–111)
Creatinine, Ser: 0.74 mg/dL (ref 0.44–1.00)
GFR, Estimated: >60 mL/min (ref >60)
Glucose, Bld: 298 mg/dL — ABNORMAL HIGH (ref 70–99)
Potassium: 4.3 mmol/L (ref 3.5–5.1)
Sodium: 133 mmol/L — ABNORMAL LOW (ref 135–145)
Total Bilirubin: 0.5 mg/dL (ref 0.3–1.2)
Total Protein: 7.1 g/dL (ref 6.5–8.1)

## 2022-11-26 LAB — CLOSTRIDIUM DIFFICILE BY PCR, REFLEXED: Toxigenic C. Difficile by PCR: POSITIVE — AB

## 2022-11-26 LAB — CBC
HCT: 25.8 % — ABNORMAL LOW (ref 36.0–46.0)
Hemoglobin: 8.5 g/dL — ABNORMAL LOW (ref 12.0–15.0)
MCH: 31 pg (ref 26.0–34.0)
MCHC: 32.9 g/dL (ref 30.0–36.0)
MCV: 94.2 fL (ref 80.0–100.0)
Platelets: 742 10*3/uL — ABNORMAL HIGH (ref 150–400)
RBC: 2.74 MIL/uL — ABNORMAL LOW (ref 3.87–5.11)
RDW: 13.4 % (ref 11.5–15.5)
WBC: 17.9 10*3/uL — ABNORMAL HIGH (ref 4.0–10.5)
nRBC: 0 % (ref 0.0–0.2)

## 2022-11-26 LAB — CBG MONITORING, ED
Glucose-Capillary: 268 mg/dL — ABNORMAL HIGH (ref 70–99)
Glucose-Capillary: 257 mg/dL — ABNORMAL HIGH (ref 70–99)

## 2022-11-26 LAB — HCG, SERUM, QUALITATIVE: Preg, Serum: NEGATIVE

## 2022-11-26 LAB — LACTIC ACID, PLASMA: Lactic Acid, Venous: 1 mmol/L (ref 0.5–1.9)

## 2022-11-26 LAB — HCG, QUANTITATIVE, PREGNANCY: hCG, Beta Chain, Quant, S: 3 m[IU]/mL (ref <5)

## 2022-11-26 MED ORDER — VANCOMYCIN HCL 1250 MG/250ML IV SOLN: 1250.0000 mg | Freq: Two times a day (BID) | INTRAVENOUS | Status: DC

## 2022-11-26 MED ORDER — VANCOMYCIN HCL 125 MG PO CAPS: 125.0000 mg | ORAL_CAPSULE | ORAL | Status: DC

## 2022-11-26 MED ORDER — KETOROLAC TROMETHAMINE 15 MG/ML IJ SOLN
15.0000 mg | Freq: Four times a day (QID) | INTRAMUSCULAR | Status: AC | PRN
  Administered 2022-11-26: 15 mg via INTRAVENOUS
  Filled 2022-11-26 (×2): qty 1

## 2022-11-26 MED ORDER — VITAMIN C 500 MG PO TABS
1000.0000 mg | ORAL_TABLET | Freq: Every day | ORAL | Status: DC
  Administered 2022-11-27 – 2022-12-03 (×7): 1000 mg via ORAL
  Filled 2022-11-26 (×7): qty 2

## 2022-11-26 MED ORDER — SODIUM CHLORIDE 0.9 % IV SOLN
1.0000 g | Freq: Three times a day (TID) | INTRAVENOUS | Status: DC
  Administered 2022-11-26 – 2022-11-27 (×3): 1 g via INTRAVENOUS
  Filled 2022-11-26 (×2): qty 20

## 2022-11-26 MED ORDER — HYDROMORPHONE HCL 1 MG/ML IJ SOLN
1.0000 mg | Freq: Once | INTRAMUSCULAR | Status: AC
  Administered 2022-11-26: 1 mg via INTRAVENOUS
  Filled 2022-11-26: qty 1

## 2022-11-26 MED ORDER — ACETAMINOPHEN 325 MG PO TABS
650.0000 mg | ORAL_TABLET | Freq: Four times a day (QID) | ORAL | Status: DC | PRN
  Administered 2022-11-27 – 2022-11-30 (×3): 650 mg via ORAL
  Filled 2022-11-26 (×3): qty 2

## 2022-11-26 MED ORDER — FENTANYL 50 MCG/HR TD PT72
1.0000 | MEDICATED_PATCH | TRANSDERMAL | Status: DC
  Filled 2022-11-26: qty 1

## 2022-11-26 MED ORDER — SODIUM CHLORIDE 0.9 % IV BOLUS
1000.0000 mL | Freq: Once | INTRAVENOUS | Status: AC
  Administered 2022-11-26: 1000 mL via INTRAVENOUS

## 2022-11-26 MED ORDER — HYDROMORPHONE HCL 1 MG/ML IJ SOLN
1.0000 mg | INTRAMUSCULAR | Status: DC | PRN
  Administered 2022-11-26 – 2022-11-30 (×27): 1 mg via INTRAVENOUS
  Filled 2022-11-26 (×27): qty 1

## 2022-11-26 MED ORDER — VANCOMYCIN HCL 125 MG PO CAPS
500.0000 mg | ORAL_CAPSULE | Freq: Four times a day (QID) | ORAL | Status: DC
  Administered 2022-11-27 – 2022-11-28 (×5): 500 mg via ORAL
  Filled 2022-11-26 (×2): qty 2
  Filled 2022-11-26: qty 4
  Filled 2022-11-26 (×3): qty 2
  Filled 2022-11-26: qty 4
  Filled 2022-11-26 (×3): qty 2
  Filled 2022-11-26: qty 4

## 2022-11-26 MED ORDER — ONDANSETRON HCL 4 MG/2ML IJ SOLN
4.0000 mg | Freq: Four times a day (QID) | INTRAMUSCULAR | Status: DC | PRN
  Administered 2022-11-26: 4 mg via INTRAVENOUS
  Filled 2022-11-26: qty 2

## 2022-11-26 MED ORDER — VANCOMYCIN HCL 1750 MG/350ML IV SOLN
1750.0000 mg | Freq: Once | INTRAVENOUS | Status: DC
  Administered 2022-11-26: 1750 mg via INTRAVENOUS
  Filled 2022-11-26: qty 350

## 2022-11-26 MED ORDER — VANCOMYCIN HCL 125 MG PO CAPS: 125.0000 mg | ORAL_CAPSULE | Freq: Every day | ORAL | Status: DC

## 2022-11-26 MED ORDER — METOCLOPRAMIDE HCL 5 MG/ML IJ SOLN
10.0000 mg | Freq: Once | INTRAMUSCULAR | Status: AC
  Administered 2022-11-26: 10 mg via INTRAVENOUS
  Filled 2022-11-26: qty 2

## 2022-11-26 MED ORDER — VANCOMYCIN HCL 125 MG PO CAPS
ORAL_CAPSULE | ORAL | Status: AC
  Filled 2022-11-26: qty 4

## 2022-11-26 MED ORDER — HYDRALAZINE HCL 20 MG/ML IJ SOLN
10.0000 mg | INTRAMUSCULAR | Status: DC | PRN
  Administered 2022-11-26 – 2022-12-02 (×5): 10 mg via INTRAVENOUS
  Filled 2022-11-26 (×5): qty 1

## 2022-11-26 MED ORDER — HYDRALAZINE HCL 20 MG/ML IJ SOLN: 5.0000 mg | INTRAMUSCULAR | Status: DC | PRN

## 2022-11-26 MED ORDER — VANCOMYCIN HCL 125 MG PO CAPS: 125.0000 mg | ORAL_CAPSULE | Freq: Four times a day (QID) | ORAL | Status: DC

## 2022-11-26 MED ORDER — SUCRALFATE 1 G PO TABS
1.0000 g | ORAL_TABLET | Freq: Three times a day (TID) | ORAL | Status: DC
  Administered 2022-11-27 – 2022-12-03 (×26): 1 g via ORAL
  Filled 2022-11-26 (×27): qty 1

## 2022-11-26 MED ORDER — METRONIDAZOLE 500 MG/100ML IV SOLN
500.0000 mg | Freq: Two times a day (BID) | INTRAVENOUS | Status: DC
  Administered 2022-11-26: 500 mg via INTRAVENOUS
  Filled 2022-11-26: qty 100

## 2022-11-26 MED ORDER — IOHEXOL 300 MG/ML  SOLN
100.0000 mL | Freq: Once | INTRAMUSCULAR | Status: AC | PRN
  Administered 2022-11-26: 100 mL via INTRAVENOUS

## 2022-11-26 MED ORDER — VANCOMYCIN HCL 125 MG PO CAPS: 125.0000 mg | ORAL_CAPSULE | Freq: Two times a day (BID) | ORAL | Status: DC

## 2022-11-26 MED ORDER — PANTOPRAZOLE SODIUM 40 MG PO TBEC: 40.0000 mg | DELAYED_RELEASE_TABLET | Freq: Every day | ORAL | Status: DC

## 2022-11-26 MED ORDER — ACETAMINOPHEN 650 MG RE SUPP: 650.0000 mg | Freq: Four times a day (QID) | RECTAL | Status: DC | PRN

## 2022-11-26 MED ORDER — ENOXAPARIN SODIUM 40 MG/0.4ML IJ SOSY
40.0000 mg | PREFILLED_SYRINGE | INTRAMUSCULAR | Status: DC
  Administered 2022-11-26 – 2022-11-30 (×5): 40 mg via SUBCUTANEOUS
  Filled 2022-11-26 (×5): qty 0.4

## 2022-11-26 MED ORDER — GABAPENTIN 300 MG PO CAPS
300.0000 mg | ORAL_CAPSULE | Freq: Three times a day (TID) | ORAL | Status: DC
  Administered 2022-11-27 – 2022-12-03 (×20): 300 mg via ORAL
  Filled 2022-11-26 (×21): qty 1

## 2022-11-26 MED ORDER — DROPERIDOL 2.5 MG/ML IJ SOLN
2.5000 mg | Freq: Once | INTRAMUSCULAR | Status: AC
  Administered 2022-11-26: 2.5 mg via INTRAVENOUS
  Filled 2022-11-26: qty 2

## 2022-11-26 MED ORDER — MAGNESIUM OXIDE -MG SUPPLEMENT 400 (240 MG) MG PO TABS: 200.0000 mg | ORAL_TABLET | Freq: Every day | ORAL | Status: DC

## 2022-11-26 MED ORDER — INSULIN ASPART 100 UNIT/ML IJ SOLN
0.0000 [IU] | Freq: Three times a day (TID) | INTRAMUSCULAR | Status: DC
  Administered 2022-11-27: 3 [IU] via SUBCUTANEOUS
  Administered 2022-11-27 – 2022-11-30 (×4): 1 [IU] via SUBCUTANEOUS
  Administered 2022-12-01 – 2022-12-02 (×3): 2 [IU] via SUBCUTANEOUS
  Administered 2022-12-02: 3 [IU] via SUBCUTANEOUS
  Administered 2022-12-02: 1 [IU] via SUBCUTANEOUS
  Administered 2022-12-03: 2 [IU] via SUBCUTANEOUS
  Administered 2022-12-03: 3 [IU] via SUBCUTANEOUS
  Filled 2022-11-26 (×2): qty 1

## 2022-11-26 MED ORDER — ZINC SULFATE 220 (50 ZN) MG PO CAPS
220.0000 mg | ORAL_CAPSULE | Freq: Every day | ORAL | Status: DC
  Administered 2022-11-27 – 2022-12-03 (×7): 220 mg via ORAL
  Filled 2022-11-26 (×7): qty 1

## 2022-11-26 MED ORDER — ONDANSETRON HCL 4 MG/2ML IJ SOLN
4.0000 mg | Freq: Three times a day (TID) | INTRAMUSCULAR | Status: DC | PRN
  Administered 2022-11-27 – 2022-11-29 (×4): 4 mg via INTRAVENOUS
  Filled 2022-11-26 (×4): qty 2

## 2022-11-26 MED ORDER — INSULIN DETEMIR 100 UNIT/ML ~~LOC~~ SOLN
4.0000 [IU] | Freq: Two times a day (BID) | SUBCUTANEOUS | Status: DC
  Administered 2022-11-26 – 2022-11-29 (×5): 4 [IU] via SUBCUTANEOUS
  Filled 2022-11-26 (×9): qty 0.04

## 2022-11-26 MED ORDER — METOCLOPRAMIDE HCL 5 MG/ML IJ SOLN
5.0000 mg | Freq: Four times a day (QID) | INTRAMUSCULAR | Status: DC
  Administered 2022-11-26 – 2022-11-28 (×7): 5 mg via INTRAVENOUS
  Filled 2022-11-26 (×7): qty 2

## 2022-11-26 MED ORDER — SODIUM CHLORIDE 0.9 % IV SOLN
2.0000 g | Freq: Once | INTRAVENOUS | Status: AC
  Administered 2022-11-26: 2 g via INTRAVENOUS
  Filled 2022-11-26: qty 12.5

## 2022-11-26 MED ORDER — INSULIN ASPART 100 UNIT/ML IJ SOLN
0.0000 [IU] | Freq: Every day | INTRAMUSCULAR | Status: DC
  Administered 2022-11-26: 3 [IU] via SUBCUTANEOUS
  Filled 2022-11-26: qty 1

## 2022-11-26 MED ORDER — LACTATED RINGERS IV SOLN: INTRAVENOUS | Status: AC

## 2022-11-26 NOTE — ED Notes (Signed)
See triage notes. Pt has vomited clear emesis twice since in ED. A/o. Slightly diaphoretic. C/o all over abd pain. 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 EDP aware and in to assess.

## 2022-11-26 NOTE — ED Notes (Signed)
Pt requesting pain meds. Edp aware. Pt had large brown bm. Pt cleaned.

## 2022-11-26 NOTE — ED Notes (Signed)
Pt mother and medical POA refusing CT scan at this time, until they have spoken to MD

## 2022-11-26 NOTE — Progress Notes (Signed)
Pharmacy Antibiotic Note  Kelli Hudson is a 32 y.o. female admitted on 11/26/2022 with cellulitis.  Pharmacy has been consulted for vancomycin dosing.  Plan: Vancomycin 1750 mg IV x 1 dose. Vancomycin 1250 mg IV every 12 hours. Cefepime 2000 mg IV x 1 dose. F/U additional doses. Monitor labs, c/s, and vanco levels as indicated.     Temp (24hrs), Avg:97.8 F (36.6 C), Min:97.7 F (36.5 C), Max:97.8 F (36.6 C)  Recent Labs  Lab 11/20/22 0808 11/21/22 0830 11/22/22 0420 11/26/22 0813 11/26/22 1059  WBC 19.5*  --  15.3* 17.9*  --   CREATININE 0.93 0.72 0.87 0.74  --   LATICACIDVEN  --   --   --   --  1.0    CrCl cannot be calculated (Unknown ideal weight.).    Allergies  Allergen Reactions   Ibuprofen Other (See Comments)    07/23/22 - Worsening kidney function after 3 doses of ibuprofen 400mg .  Creatinine increased from 1.3 to 2.  Creatinine returned to normal after stopping.   Lisinopril Cough, Other (See Comments), Itching and Rash    Breakout in rash    Antimicrobials this admission: Vanco 10/9 >> Cefepime 10/9 Flagyl 10/9  Microbiology results: 10/9 BCx: pending    Cdiff antigen +, toxin (-) Cdiff PCR: +  Thank you for allowing pharmacy to be a part of this patient's care.  Tad Moore 11/26/2022 4:39 PM

## 2022-11-26 NOTE — ED Notes (Signed)
Pt c/o pain to mid chest after vomiting. Pt vomited small amount. EDP aware.

## 2022-11-26 NOTE — H&P (Addendum)
TRH H&P   Patient Demographics:    Kelli Hudson, is a 32 y.o. female  MRN: [id number]   DOB - 09-May-1990  Admit Date - 11/26/2022  Outpatient Primary MD for the patient is Norm Salt, PA  Referring MD/NP/PA: Dr Hyacinth Meeker  Patient coming from: [facility]  Chief Complaint  Patient presents with   Emesis      HPI:    Kelli Hudson  is a 32 y.o. 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  Was discharged to SNF. -Patient was sent from the facility secondary to intractable nausea, vomiting and complaints of abdominal pain, she is with known history of diabetes and gastroparesis, as well she does report history of diarrhea, patient had no complaints yesterday, all symptoms started this morning, she denies fever, chills, or chest pain related to cough. -ED CT abdomen pelvis significant for diffuse colitis, her C. difficile PCR came back positive, as well C. difficile antigen positive, but toxin was negative, she has significant leukocytosis at 18.9, known anemia with hemoglobin of 8.5, alk phos elevated at 492, but it  is trending down from 773 last month.  CT lower extremities was significant for fluid collections bilaterally concerning for abscess, [facility] hospitalist consulted to admit.    Review of systems:      A full 10 point Review of Systems was done, except as stated above, all other Review of Systems were negative.   With Past History of the following :    Past Medical History:  Diagnosis Date   Acute H. pylori gastric ulcer    Coffee ground emesis    Diabetes mellitus (HCC)    Diabetic gastroparesis (HCC)    DKA (diabetic ketoacidosis)  (HCC) 02/24/2021   Gastroparesis    GERD (gastroesophageal reflux disease)    Hypertension    Hyperthyroidism    Intractable nausea and vomiting 04/20/2021   Normocytic anemia 04/20/2020   Prolonged Q-T interval on ECG       Past Surgical History:  Procedure Laterality Date   AMPUTATION Bilateral 10/24/2022   Procedure: BILATERAL BELOW KNEE AMPUTATION;  Surgeon: Nadara Mustard, MD;  Location: [facility] OR;  Service: Orthopedics;  Laterality: Bilateral;   AMPUTATION Bilateral 10/08/2022   Procedure: BILATERAL LEG DEBRIDEMENT;  Surgeon: Nadara Mustard, MD;  Location: [facility] OR;  Service: Orthopedics;  Laterality: Bilateral;   AMPUTATION Bilateral 11/14/2022   Procedure: BILATERAL ABOVE KNEE AMPUTATION;  Surgeon: Nadara Mustard, MD;  Location: [facility] OR;  Service: Orthopedics;  Laterality: Bilateral;   AMPUTATION TOE Left 03/10/2018   Procedure: AMPUTATION FIFTH TOE;  Surgeon: Vivi Barrack, DPM;  Location: [facility];  Service: Podiatry;  Laterality: Left;   APPLICATION OF WOUND VAC Bilateral 10/12/2022   Procedure:  APPLICATION OF WOUND VAC BILATERAL LEGS;  Surgeon: Myrene Galas, MD;  Location: [facility] OR;  Service: Orthopedics;  Laterality: Bilateral;   APPLICATION OF WOUND VAC Bilateral 10/24/2022   Procedure: APPLICATION OF WOUND VAC;  Surgeon: Nadara Mustard, MD;  Location: [facility] OR;  Service: Orthopedics;  Laterality: Bilateral;   BIOPSY  01/28/2020   Procedure: BIOPSY;  Surgeon: Kathi Der, MD;  Location: [facility];  Service: Gastroenterology;;   BOTOX INJECTION  08/26/2021   Procedure: BOTOX INJECTION;  Surgeon: Sherrilyn Rist, MD;  Location: [facility];  Service: Gastroenterology;;   ESOPHAGOGASTRODUODENOSCOPY N/A 01/28/2020   Procedure: ESOPHAGOGASTRODUODENOSCOPY (EGD);  Surgeon: Kathi Der, MD;  Location: [facility];  Service: Gastroenterology;  Laterality: N/A;   ESOPHAGOGASTRODUODENOSCOPY (EGD) WITH PROPOFOL Left 09/08/2015   Procedure:  ESOPHAGOGASTRODUODENOSCOPY (EGD) WITH PROPOFOL;  Surgeon: Willis Modena, MD;  Location: [facility] ENDOSCOPY;  Service: Endoscopy;  Laterality: Left;   ESOPHAGOGASTRODUODENOSCOPY (EGD) WITH PROPOFOL N/A 08/26/2021   Procedure: ESOPHAGOGASTRODUODENOSCOPY (EGD) WITH PROPOFOL;  Surgeon: Sherrilyn Rist, MD;  Location: [facility];  Service: Gastroenterology;  Laterality: N/A;   GASTRIC STIMULATOR IMPLANT SURGERY  06/2022   at [facility]   I & D EXTREMITY Bilateral 10/12/2022   Procedure: IRRIGATION AND  DEBRIDEMENT  BILATERAL LEGS;  Surgeon: Myrene Galas, MD;  Location: [facility] OR;  Service: Orthopedics;  Laterality: Bilateral;   I & D EXTREMITY Right 10/13/2022   Procedure: RIGHT THIGH WOUND VAC EXCHANGE;  Surgeon: Roby Lofts, MD;  Location: [facility] OR;  Service: Orthopedics;  Laterality: Right;   I & D EXTREMITY Bilateral 10/17/2022   Procedure: BILATERAL THIGH AND LEG DEBRIDEMENT, PARTIAL BILATERAL FOOT AMPUTATION;  Surgeon: Nadara Mustard, MD;  Location: [facility] OR;  Service: Orthopedics;  Laterality: Bilateral;   I & D EXTREMITY Bilateral 11/05/2022   Procedure: BILATERAL THIGH DEBRIDEMENT;  Surgeon: Nadara Mustard, MD;  Location: [facility] OR;  Service: Orthopedics;  Laterality: Bilateral;   I & D EXTREMITY Bilateral 11/07/2022   Procedure: BILATERAL THIGH AND LEG DEBRIDEMENT;  Surgeon: Nadara Mustard, MD;  Location: [facility] OR;  Service: Orthopedics;  Laterality: Bilateral;   I & D EXTREMITY Bilateral 11/12/2022   Procedure: BILATERAL LEG DEBRIDEMENTS;  Surgeon: Nadara Mustard, MD;  Location: [facility] OR;  Service: Orthopedics;  Laterality: Bilateral;   IRRIGATION AND DEBRIDEMENT FOOT Bilateral 10/05/2022   Procedure: IRRIGATION AND DEBRIDEMENT FOOT;  Surgeon: [name], DPM;  Location: [facility];  Service: Orthopedics/Podiatry;  Laterality: Bilateral;   WISDOM TOOTH EXTRACTION        Social History:     Social History   Tobacco Use   Smoking status: Never   Smokeless tobacco: Never  Substance Use Topics   Alcohol use:  Yes    Alcohol/week: 1.0 standard drink of alcohol    Types: 1 Shots of liquor per week    Comment: occasional         Family History :     Family History  Problem Relation Age of Onset   Lung cancer Mother    Diabetes Mother    Bipolar disorder Father    Liver cancer Maternal Grandfather    Diabetes Maternal Aunt        x 2   Pancreatic cancer Maternal Uncle    Prostate cancer Maternal Uncle    Breast cancer Other        maternal great aunt   Heart disease Other    Ovarian cancer Other        maternal great  aunt   Kidney disease Other        maternal great aunt   Colon cancer Neg Hx    Stomach cancer Neg Hx      Home Medications:   Prior to Admission medications   Medication Sig Start Date End Date Taking? Authorizing Provider  acetaminophen (TYLENOL) 325 MG tablet Take 2 tablets (650 mg total) by mouth every 6 (six) hours as needed for mild pain, fever or headache. 11/22/22  Yes Azucena Fallen, MD  ascorbic acid (VITAMIN C) 1000 MG tablet Take 1 tablet (1,000 mg total) by mouth daily. 11/22/22  Yes Azucena Fallen, MD  DULoxetine (CYMBALTA) 60 MG capsule Take 1 capsule (60 mg total) by mouth daily. 11/22/22  Yes Azucena Fallen, MD  etonogestrel (NEXPLANON) 68 MG IMPL implant 1 each by Subdermal route continuous.   Yes [provider]  feeding supplement, GLUCERNA SHAKE, (GLUCERNA SHAKE) LIQD Take 237 mLs by mouth 3 (three) times daily between meals. 11/22/22  Yes Azucena Fallen, MD  fentaNYL (DURAGESIC) 50 MCG/HR Place 1 patch onto the skin every 3 (three) days. 11/23/22  Yes Azucena Fallen, MD  gabapentin (NEURONTIN) 300 MG capsule Take 1 capsule (300 mg total) by mouth every 8 (eight) hours. 11/22/22  Yes Azucena Fallen, MD  HYDROmorphone (DILAUDID) 2 MG tablet Take 1-2 tablets (2-4 mg total) by mouth every 3 (three) hours as needed for severe pain or moderate pain. 11/22/22  Yes Azucena Fallen, MD  insulin detemir (LEVEMIR  FLEXTOUCH) 100 UNIT/ML FlexPen Inject 4 Units into the skin 2 (two) times daily. 11/22/22  Yes Azucena Fallen, MD  MAGNESIUM PO Take 1 tablet by mouth daily.   Yes [provider]  methocarbamol (ROBAXIN) 500 MG tablet Take 500 mg by mouth 3 (three) times daily. 11/22/22  Yes [provider]  metoCLOPramide (REGLAN) 10 MG tablet Take 1 tablet (10 mg total) by mouth every 8 (eight) hours. 11/22/22  Yes Azucena Fallen, MD  Multiple Vitamin (MULTIVITAMIN WITH MINERALS) TABS tablet Take 1 tablet by mouth daily. 11/22/22  Yes Azucena Fallen, MD  nutrition supplement, JUVEN, (JUVEN) PACK Take 1 packet by mouth 2 (two) times daily between meals. 11/22/22  Yes Azucena Fallen, MD  omeprazole (PRILOSEC) 40 MG capsule Take 1 capsule (40 mg total) by mouth daily. 11/22/22  Yes Azucena Fallen, MD  OVER THE COUNTER MEDICATION Take 1 tablet by mouth daily. Nervive   Yes [provider]  sucralfate (CARAFATE) 1 g tablet Take 1 g by mouth in the morning, at noon, and at bedtime. 08/28/22  Yes [provider]  traZODone (DESYREL) 50 MG tablet Take 1 tablet (50 mg total) by mouth at bedtime as needed for sleep. 11/22/22  Yes Azucena Fallen, MD  zinc sulfate 220 (50 Zn) MG capsule Take 1 capsule (220 mg total) by mouth daily for 6 days. 11/22/22 11/28/22 Yes Azucena Fallen, MD  Continuous Glucose Sensor (DEXCOM G6 SENSOR) MISC 1 Device by Does not apply route as directed. 06/18/22   Shamleffer, Konrad Dolores, MD  Continuous Glucose Transmitter (DEXCOM G6 TRANSMITTER) MISC 1 Device by Does not apply route as directed. 06/18/22   Shamleffer, Konrad Dolores, MD  Insulin Pen Needle 32G X 4 MM MISC 1 Device by Does not apply route in the morning, at noon, in the evening, and at bedtime. 06/18/22   Shamleffer, Konrad Dolores, MD  insulin aspart (NOVOLOG) 100 UNIT/ML FlexPen Inject 5 Units  into the skin 3 (three) times daily with meals. 02/23/18 07/05/19  Jerald Kief, MD     Allergies:     Allergies  Allergen Reactions   Ibuprofen Other (See Comments)    07/23/22 - Worsening kidney function after 3 doses of ibuprofen 400mg .  Creatinine increased from 1.3 to 2.  Creatinine returned to normal after stopping.   Lisinopril Cough, Other (See Comments), Itching and Rash    Breakout in rash     Physical Exam:   Vitals  Blood pressure (!) 182/124, pulse (!) 120, temperature 97.8 F (36.6 C), temperature source Oral, resp. rate 13, SpO2 99%.   1. General Appearing, female, in discomfort due to pain  2. Normal affect and insight, Not Suicidal or Homicidal, Awake Alert, Oriented X 3.  3. No F.N deficits, ALL C.Nerves Intact, Strength 5/5 all 4 extremities, Sensation intact all 4 extremities, Plantars down going.  4. Ears and Eyes appear Normal, Conjunctivae clear, PERRLA. Moist Oral Mucosa.  5. Supple Neck, No JVD, No cervical lymphadenopathy appriciated, No Carotid Bruits.  6. Symmetrical Chest wall movement, Good air movement bilaterally, CTAB.  7.  Khaki cardiac, No Gallops, Rubs or Murmurs, No Parasternal Heave.  8. Positive Bowel Sounds, abdomen tender to palpation, but no rebound, no guarding .  9.  No Cyanosis, delayed skin Turgor, No Skin Rash or Bruise.  10.  Bilateral AKA, with staples present, please see pictures below       Data Review:    CBC Recent Labs  Lab 11/20/22 0808 11/22/22 0420 11/26/22 0813  WBC 19.5* 15.3* 17.9*  HGB 8.4* 8.2* 8.5*  HCT 25.4* 24.9* 25.8*  PLT 585* 588* 742*  MCV 90.4 94.3 94.2  MCH 29.9 31.1 31.0  MCHC 33.1 32.9 32.9  RDW 13.5 13.7 13.4  LYMPHSABS 3.1 2.7  --   MONOABS 1.1* 1.2*  --   EOSABS 0.3 0.3  --   BASOSABS 0.1 0.1  --    ------------------------------------------------------------------------------------------------------------------  Chemistries  Recent Labs  Lab 11/20/22 0808 11/21/22 0830 11/22/22 0420 11/26/22 0813  NA 133* 132* 137 133*  K 4.8 4.5 4.4  4.3  CL 107 107 109 107  CO2 19* 19* 21* 21*  GLUCOSE 90 162* 178* 298*  BUN 30* 35* 36* 26*  CREATININE 0.93 0.72 0.87 0.74  CALCIUM 7.7* 7.7* 7.8* 8.1*  AST  --   --   --  25  ALT  --   --   --  31  ALKPHOS  --   --   --  492*  BILITOT  --   --   --  0.5   ------------------------------------------------------------------------------------------------------------------ CrCl cannot be calculated (Unknown ideal weight.). ------------------------------------------------------------------------------------------------------------------ No results for input(s): "TSH", "T4TOTAL", "T3FREE", "THYROIDAB" in the last 72 hours.  Invalid input(s): "FREET3"  Coagulation profile No results for input(s): "INR", "PROTIME" in the last 168 hours. ------------------------------------------------------------------------------------------------------------------- No results for input(s): "DDIMER" in the last 72 hours. -------------------------------------------------------------------------------------------------------------------  Cardiac Enzymes No results for input(s): "CKMB", "TROPONINI", "MYOGLOBIN" in the last 168 hours.  Invalid input(s): "CK" ------------------------------------------------------------------------------------------------------------------ No results found for: "BNP"   ---------------------------------------------------------------------------------------------------------------  Urinalysis    Component Value Date/Time   COLORURINE YELLOW 05/05/2022 1416   APPEARANCEUR CLEAR 05/05/2022 1416   LABSPEC 1.016 05/05/2022 1416   PHURINE 6.0 05/05/2022 1416   GLUCOSEU >=500 (A) 05/05/2022 1416   GLUCOSEU NEGATIVE 05/22/2020 1417   HGBUR SMALL (A) 05/05/2022 1416   BILIRUBINUR NEGATIVE 05/05/2022 1416   KETONESUR NEGATIVE 05/05/2022 1416  PROTEINUR >=300 (A) 05/05/2022 1416   UROBILINOGEN 0.2 05/22/2020 1417   NITRITE NEGATIVE 05/05/2022 1416   LEUKOCYTESUR NEGATIVE  05/05/2022 1416    ----------------------------------------------------------------------------------------------------------------   Imaging Results:    CT EXTREM LOWER W CM BIL  Result Date: 11/26/2022 CLINICAL DATA:  Soft tissue infection suspected, lower leg. X-ray done. Status post bilateral above the knee amputation 1 week ago. Patient has been vomiting all morning. EXAM: CT OF THE LOWER BILATERAL EXTREMITY WITH CONTRAST TECHNIQUE: Multidetector CT imaging of the lower bilateral extremity was performed according to the standard protocol following intravenous contrast administration. RADIATION DOSE REDUCTION: This exam was performed according to the departmental dose-optimization program which includes automated exposure control, adjustment of the mA and/or kV according to patient size and/or use of iterative reconstruction technique. COMPARISON:  CT right tibia and fibula and right femur 11/01/2022, CT left tibia and fibula and left femur 11/01/2022 CONTRAST:  OMNIPAQUE IOHEXOL 300 MG/ML  SOLN FINDINGS: Bones/Joint/Cartilage Mild bilateral superolateral acetabular degenerative osteophytosis. Minimal superior pubic symphysis joint space narrowing. Interval postsurgical changes of bilateral above-the-knee amputation. Starting at the superior aspect of the femoral head, the right femur measures up to approximately 30.8 cm in the left femur measures up to approximately 30.3 cm. The distal femoral diaphyseal amputation margins are sharp. There are surgical skin staples at the distal aspect of each soft tissue distal amputation stump. Right lower extremity: There is a low-density collection just distal to the amputation stump and extending up the distal lateral greater than medial thigh (axial series 2 images 58 through 73 and axial series 5 images 1 through 17). This collection is of moderate density and contains moderate scattered air. There is mild peripheral rim enhancement. This is suspicious for  an abscess, and appears to extend to the prior surgical incision at the distal stump, possibly draining to the skin surface (coronal series 7, images 43-45). The irregular size of this collection makes measurements difficult, however it appears to measure up to approximately 11 cm in transverse dimension. Extends up the lateral thigh approximately 8.5 cm (as measured on coronal series 3, image 62) and the medial thigh up to approximately 4.6 cm. No cortical erosion to indicate acute osteomyelitis. Left lower extremity: Simple are to the contralateral side, there is a low-density collection just distal to the amputation stump extending up the distal lateral greater than medial thigh (axial series 2 images 47 through 68 and axial series 5 images 1 through 12). This collection is similar moderate density and contains more mild scattered air compared to the contralateral leg. There is mild peripheral rim enhancement. A thin tract is seen from this fluid draining to the distal anterior amputation stump surgical incision at the skin surface (coronal series 3 images 42 through 45, axial series 5 images 13 through 15). This irregular collection measures up to approximately 10 cm in greatest transverse dimension. It extends up the lateral thigh approximately 8 cm and up the medial thigh approximately 5.5 cm (as measured on coronal series 3 images 57 and 49, respectively). Ligaments Suboptimally assessed by CT. Muscles and Tendons There is heterogeneous partially rim enhancing low-density within the medial aspect of the distal right sartorius muscle that again extends to the medial skin surface (axial series 2, images 52 through 66). The lowest density, rim enhancing portion measures up to 1.5 x 1.2 x 6.0 cm (transverse by AP by craniocaudal, axial series 2, image 54 and coronal series 3, image 54), decreased from prior. This again may extend  and drained to the distal medial thigh surgical incision site (axial series 2 images  57 through 64). There is fluid tracking up the lateral right thigh in between the lateral subcutaneous fat in the vastus lateralis muscle, overall measuring up to approximately 3 x 9 x 19 cm (transverse by AP by craniocaudal, axial series 2, image 47 and coronal series 3, image 63). There is similar fluid tracking along the lateral left thigh in between the deep aspect of the subcutaneous fat in the vastus lateralis muscle, measuring up to approximately 2 cm in transverse dimension and 18 cm in craniocaudal dimension. This is seen on the coronal images but is off the lateral plane of view of the axial series 2. There is interval decrease in now minimal complex fluid anteromedial to the sartorius and vastus medialis muscles that minimally extends through the distal anteromedial thigh skin surface (axial series 2, images 44 through 66). Soft tissues There is diffuse moderate subcutaneous fat edema and swelling. Fluid distends the rectum, and there is moderate rectal wall thickening as seen on today's CT abdomen and pelvis. IMPRESSION: Compared to 11/01/2022: 1. Interval postsurgical changes of revised, now bilateral above-the-knee amputation. 2. There are bilateral low-density collections just distal to the amputation stumps extending up the distal lateral greater than medial thigh. These collections contain moderate scattered air and are suspicious for abscesses. These collections appear to extend to the prior surgical incision at the distal stump, possibly draining to the skin surface. 3. There is complex fluid tracking between the lateral thigh subcutaneous fat and the vastus lateralis muscles bilaterally, as above. 4. Interval decrease in size of small complex fluid collections superficial to the anteromedial aspect of the right sartorius muscle and the junction of the left sartorius and vastus medialis muscles, likely minimally draining to the anteromedial thigh skin surface. Electronically Signed   By: Neita Garnet M.D.   On: 11/26/2022 14:56   DG Chest Portable 1 View  Result Date: 11/26/2022 CLINICAL DATA:  Tachycardia, vomiting, diaphoretic EXAM: PORTABLE CHEST 1 VIEW COMPARISON:  10/08/2022 FINDINGS: Diffuse symmetric and bilateral interstitial opacities favored to be interstitial edema/early CHF. Mild cardiac enlargement. No large effusion or pneumothorax. Trachea midline. Monitor leads overlie the chest. IMPRESSION: Cardiomegaly with diffuse interstitial edema pattern. Electronically Signed   By: Judie Petit.  Shick M.D.   On: 11/26/2022 14:54   CT ABDOMEN PELVIS W CONTRAST  Result Date: 11/26/2022 CLINICAL DATA:  Vomiting and generalized abdominal pain 1 week after bilateral above knee amputation. EXAM: CT ABDOMEN AND PELVIS WITH CONTRAST TECHNIQUE: Multidetector CT imaging of the abdomen and pelvis was performed using the standard protocol following bolus administration of intravenous contrast. RADIATION DOSE REDUCTION: This exam was performed according to the departmental dose-optimization program which includes automated exposure control, adjustment of the mA and/or kV according to patient size and/or use of iterative reconstruction technique. CONTRAST:  OMNIPAQUE IOHEXOL 300 MG/ML  SOLN COMPARISON:  April 05, 2022. FINDINGS: Lower chest: Minimal bibasilar subsegmental atelectasis or edema is noted. Hepatobiliary: No focal liver abnormality is seen. No gallstones, gallbladder wall thickening, or biliary dilatation. Pancreas: Unremarkable. No pancreatic ductal dilatation or surrounding inflammatory changes. Spleen: Normal in size without focal abnormality. Adrenals/Urinary Tract: Adrenal glands are unremarkable. Kidneys are normal, without renal calculi, focal lesion, or hydronephrosis. Bladder is unremarkable. Stomach/Bowel: The stomach is unremarkable. The appendix appears normal. There is no evidence of bowel obstruction. Moderate wall thickening of the colon and rectum is noted most consistent with  infectious or inflammatory  colitis. Vascular/Lymphatic: No significant vascular findings are present. No enlarged abdominal or pelvic lymph nodes. Reproductive: Prostate is unremarkable. Other: No ascites or hernia is noted.  Mild anasarca is noted. Musculoskeletal: No acute or significant osseous findings. IMPRESSION: Moderate diffuse colonic wall thickening is noted suggesting infectious or inflammatory colitis. Minimal bibasilar subsegmental atelectasis or edema is noted. Electronically Signed   By: Lupita Raider M.D.   On: 11/26/2022 14:07    EKG: Vent. rate 106 BPM PR interval 138 ms QRS duration 82 ms QT/QTcB 313/416 ms P-R-T axes 69 56 50 Sinus tachycardia Probable anteroseptal infarct, old Baseline wander in lead(s) III  Assessment & Plan:    Principal Problem:   Sepsis (HCC) Active Problems:   Essential hypertension   Drug-seeking behavior   Uncontrolled type 1 diabetes mellitus with hyperglycemia, with long-term current use of insulin (HCC)   Hypertension associated with diabetes (HCC)   Diabetic polyneuropathy associated with type 1 diabetes mellitus (HCC)   Diabetic nephropathy associated with type 1 diabetes mellitus (HCC)   Diabetic retinopathy of both eyes associated with type 1 diabetes mellitus (HCC)   GERD without esophagitis   Diabetic gastroparesis (HCC)  Sepsis present on admission -secondary to C. difficile colitis versus bilateral infection and recent AKA amputation site.  Abdominal pain, nausea, vomiting and diarrhea C. difficile colitis -With multiple specialization's, most recently last month, broad-spectrum antibiotic coverage -CT abdomen pelvis significant for colitis, her C. difficile is positive for toxigenic C. difficile PCR, positive for C. difficile antigen, but negative for C. difficile toxin, but given her clinical presentation, and CT findings, that should be no stick of C. difficile colitis, so should be started on p.o. vancomycin, I will add IV  Flagyl for now given her oral intake is unreliable due to nausea and vomiting -Will start on probiotics  Recent bilateral AKA amputation with fluid collection at surgical site -Surgical wound does not look infected, but CT extremity evidence with collection bilaterally, so she will be admitted to [facility], evaluated by orthopedic team, her primary surgeon is Dr. Lajoyce Corners, to be called when she gets [facility] -Will be kept empirically on meropenem given her history of ESBL infection, but should be discontinued if that fluid lection felt not infection by orthopedic given her C. Difficile   Nausea and vomiting known history of gastroparesis  -Continue with as needed Zofran, she is on scheduled Reglan at home, will continue on IV form.  Diabetes mellitus, type I, -Even though her oral intake is unreliable, she will be kept on low-dose Semglee, and low-dose insulin sliding scale to prevent DKA -Continue with IV fluids  Elevated alk phos -it is actually trending down, no acute finding bile duct on CT abdomen pelvis   Diabetic neuropathy -Continue with gabapentin  Hypertension -Not on any blood pressure medications, she had history of hypotension in the past, pressures currently elevated, so she will be kept on as needed hydralazine  GERD -Will hold PPI due to C. difficile infection  Will send urine pregnancy test.  DVT Prophylaxis Heparin  AM Labs Ordered, also please review Full Orders  Family Communication: Admission, patients condition and plan of care including tests being ordered have been discussed with the patient and friend at bedside who indicate understanding and agree with the plan and Code Status.  Code Status full code  Likely DC to > back to SNF  Condition GUARDED  Consults called: Patient will need orthopedic consult called when she gets to [facility]  [facility]  Admission status: Inpatient  Time spent in minutes : 70 minutes   Huey Bienenstock M.D on 11/26/2022 at 7:13 PM   [facility] - Office  [phone]

## 2022-11-26 NOTE — ED Notes (Signed)
Unable to obtain UA due to bowel movement in sample. Will try again next time pt needs to urinate

## 2022-11-26 NOTE — ED Triage Notes (Signed)
Pt BIB EMS from [facility]. 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 Pt has been vomiting all morning. Hx of gastroparesis. Pt having severe abd pain all over. [name]

## 2022-11-26 NOTE — ED Notes (Signed)
Pt has not vomited since reglan given.

## 2022-11-26 NOTE — ED Provider Notes (Signed)
EMERGENCY DEPARTMENT AT [facility] Provider Note   CSN: [id number] Arrival date & time: 11/26/22  4098     History  Chief Complaint  Patient presents with   Emesis    Kelli Hudson is a 32 y.o. female.  Present emergency department nausea vomiting abdominal pain in the setting of diabetes and gastroparesis.  Was fine yesterday, symptoms started this morning with generalized abdominal pain and nausea vomiting.   Emesis      Home Medications Prior to Admission medications   Medication Sig Start Date End Date Taking? Authorizing Provider  acetaminophen (TYLENOL) 325 MG tablet Take 2 tablets (650 mg total) by mouth every 6 (six) hours as needed for mild pain, fever or headache. 11/22/22  Yes Azucena Fallen, MD  ascorbic acid (VITAMIN C) 1000 MG tablet Take 1 tablet (1,000 mg total) by mouth daily. 11/22/22  Yes Azucena Fallen, MD  DULoxetine (CYMBALTA) 60 MG capsule Take 1 capsule (60 mg total) by mouth daily. 11/22/22  Yes Azucena Fallen, MD  etonogestrel (NEXPLANON) 68 MG IMPL implant 1 each by Subdermal route continuous.   Yes [provider]  feeding supplement, GLUCERNA SHAKE, (GLUCERNA SHAKE) LIQD Take 237 mLs by mouth 3 (three) times daily between meals. 11/22/22  Yes Azucena Fallen, MD  fentaNYL (DURAGESIC) 50 MCG/HR Place 1 patch onto the skin every 3 (three) days. 11/23/22  Yes Azucena Fallen, MD  gabapentin (NEURONTIN) 300 MG capsule Take 1 capsule (300 mg total) by mouth every 8 (eight) hours. 11/22/22  Yes Azucena Fallen, MD  HYDROmorphone (DILAUDID) 2 MG tablet Take 1-2 tablets (2-4 mg total) by mouth every 3 (three) hours as needed for severe pain or moderate pain. 11/22/22  Yes Azucena Fallen, MD  insulin detemir (LEVEMIR FLEXTOUCH) 100 UNIT/ML FlexPen Inject 4 Units into the skin 2 (two) times daily. 11/22/22  Yes Azucena Fallen, MD  MAGNESIUM PO Take 1 tablet by mouth daily.   Yes  [provider]  methocarbamol (ROBAXIN) 500 MG tablet Take 500 mg by mouth 3 (three) times daily. 11/22/22  Yes [provider]  metoCLOPramide (REGLAN) 10 MG tablet Take 1 tablet (10 mg total) by mouth every 8 (eight) hours. 11/22/22  Yes Azucena Fallen, MD  Multiple Vitamin (MULTIVITAMIN WITH MINERALS) TABS tablet Take 1 tablet by mouth daily. 11/22/22  Yes Azucena Fallen, MD  nutrition supplement, JUVEN, (JUVEN) PACK Take 1 packet by mouth 2 (two) times daily between meals. 11/22/22  Yes Azucena Fallen, MD  omeprazole (PRILOSEC) 40 MG capsule Take 1 capsule (40 mg total) by mouth daily. 11/22/22  Yes Azucena Fallen, MD  OVER THE COUNTER MEDICATION Take 1 tablet by mouth daily. Nervive   Yes [provider]  sucralfate (CARAFATE) 1 g tablet Take 1 g by mouth in the morning, at noon, and at bedtime. 08/28/22  Yes [provider]  traZODone (DESYREL) 50 MG tablet Take 1 tablet (50 mg total) by mouth at bedtime as needed for sleep. 11/22/22  Yes Azucena Fallen, MD  zinc sulfate 220 (50 Zn) MG capsule Take 1 capsule (220 mg total) by mouth daily for 6 days. 11/22/22 11/28/22 Yes Azucena Fallen, MD  Continuous Glucose Sensor (DEXCOM G6 SENSOR) MISC 1 Device by Does not apply route as directed. 06/18/22   Shamleffer, Konrad Dolores, MD  Continuous Glucose Transmitter (DEXCOM G6 TRANSMITTER) MISC 1 Device by Does not apply route as directed. 06/18/22  Shamleffer, Konrad Dolores, MD  Insulin Pen Needle 32G X 4 MM MISC 1 Device by Does not apply route in the morning, at noon, in the evening, and at bedtime. 06/18/22   Shamleffer, Konrad Dolores, MD  insulin aspart (NOVOLOG) 100 UNIT/ML FlexPen Inject 5 Units into the skin 3 (three) times daily with meals. 02/23/18 07/05/19  Jerald Kief, MD      Allergies    Ibuprofen and Lisinopril    Review of Systems   Review of Systems  Gastrointestinal:  Positive for vomiting.    Physical  Exam Updated Vital Signs BP (!) 182/124   Pulse (!) 120   Temp 97.8 F (36.6 C) (Oral)   Resp 13   SpO2 99%  Physical Exam Vitals and nursing note reviewed.  HENT:     Nose: Nose normal.     Mouth/Throat:     Mouth: Mucous membranes are moist.  Eyes:     Conjunctiva/sclera: Conjunctivae normal.  Cardiovascular:     Rate and Rhythm: Regular rhythm. Tachycardia present.  Pulmonary:     Effort: Pulmonary effort is normal.  Abdominal:     General: Abdomen is flat. There is no distension.     Tenderness: There is abdominal tenderness (diffuse). There is no guarding or rebound.  Musculoskeletal:     Comments: Managing over BKA's.  Some shadowing on right knee.  Skin:    General: Skin is warm and dry.     Capillary Refill: Capillary refill takes less than 2 seconds.  Neurological:     Mental Status: She is alert and oriented to person, place, and time.  Psychiatric:        Mood and Affect: Mood normal.        Behavior: Behavior normal.     ED Results / Procedures / Treatments   Labs (all labs ordered are listed, but only abnormal results are displayed) Labs Reviewed  C DIFFICILE QUICK SCREEN W PCR REFLEX   - Abnormal; Notable for the following components:      Result Value   C Diff antigen POSITIVE (*)    All other components within normal limits  CLOSTRIDIUM DIFFICILE BY PCR, REFLEXED - Abnormal; Notable for the following components:   Toxigenic C. Difficile by PCR POSITIVE (*)    All other components within normal limits  CBC - Abnormal; Notable for the following components:   WBC 17.9 (*)    RBC 2.74 (*)    Hemoglobin 8.5 (*)    HCT 25.8 (*)    Platelets 742 (*)    All other components within normal limits  COMPREHENSIVE METABOLIC PANEL - Abnormal; Notable for the following components:   Sodium 133 (*)    CO2 21 (*)    Glucose, Bld 298 (*)    BUN 26 (*)    Calcium 8.1 (*)    Albumin <1.5 (*)    Alkaline Phosphatase 492 (*)    All other components within  normal limits  BLOOD GAS, VENOUS - Abnormal; Notable for the following components:   pCO2, Ven 38 (*)    All other components within normal limits  CBG MONITORING, ED - Abnormal; Notable for the following components:   Glucose-Capillary 268 (*)    All other components within normal limits  GASTROINTESTINAL PANEL BY PCR, STOOL (REPLACES STOOL CULTURE)  CULTURE, BLOOD (ROUTINE X 2)  CULTURE, BLOOD (ROUTINE X 2)  LIPASE, BLOOD  HCG, SERUM, QUALITATIVE  LACTIC ACID, PLASMA  URINALYSIS, ROUTINE W REFLEX MICROSCOPIC  EKG EKG Interpretation Date/Time:  Wednesday November 26 2022 08:25:51 EDT Ventricular Rate:  106 PR Interval:  138 QRS Duration:  82 QT Interval:  313 QTC Calculation: 416 R Axis:   56  Text Interpretation: Sinus tachycardia Probable anteroseptal infarct, old Baseline wander in lead(s) III Confirmed by Estanislado Pandy [phone]) on 11/26/2022 8:31:23 AM  Radiology CT EXTREM LOWER W CM BIL  Result Date: 11/26/2022 CLINICAL DATA:  Soft tissue infection suspected, lower leg. X-ray done. Status post bilateral above the knee amputation 1 week ago. Patient has been vomiting all morning. EXAM: CT OF THE LOWER BILATERAL EXTREMITY WITH CONTRAST TECHNIQUE: Multidetector CT imaging of the lower bilateral extremity was performed according to the standard protocol following intravenous contrast administration. RADIATION DOSE REDUCTION: This exam was performed according to the departmental dose-optimization program which includes automated exposure control, adjustment of the mA and/or kV according to patient size and/or use of iterative reconstruction technique. COMPARISON:  CT right tibia and fibula and right femur 11/01/2022, CT left tibia and fibula and left femur 11/01/2022 CONTRAST:  OMNIPAQUE IOHEXOL 300 MG/ML  SOLN FINDINGS: Bones/Joint/Cartilage Mild bilateral superolateral acetabular degenerative osteophytosis. Minimal superior pubic symphysis joint space narrowing. Interval  postsurgical changes of bilateral above-the-knee amputation. Starting at the superior aspect of the femoral head, the right femur measures up to approximately 30.8 cm in the left femur measures up to approximately 30.3 cm. The distal femoral diaphyseal amputation margins are sharp. There are surgical skin staples at the distal aspect of each soft tissue distal amputation stump. Right lower extremity: There is a low-density collection just distal to the amputation stump and extending up the distal lateral greater than medial thigh (axial series 2 images 58 through 73 and axial series 5 images 1 through 17). This collection is of moderate density and contains moderate scattered air. There is mild peripheral rim enhancement. This is suspicious for an abscess, and appears to extend to the prior surgical incision at the distal stump, possibly draining to the skin surface (coronal series 7, images 43-45). The irregular size of this collection makes measurements difficult, however it appears to measure up to approximately 11 cm in transverse dimension. Extends up the lateral thigh approximately 8.5 cm (as measured on coronal series 3, image 62) and the medial thigh up to approximately 4.6 cm. No cortical erosion to indicate acute osteomyelitis. Left lower extremity: Simple are to the contralateral side, there is a low-density collection just distal to the amputation stump extending up the distal lateral greater than medial thigh (axial series 2 images 47 through 68 and axial series 5 images 1 through 12). This collection is similar moderate density and contains more mild scattered air compared to the contralateral leg. There is mild peripheral rim enhancement. A thin tract is seen from this fluid draining to the distal anterior amputation stump surgical incision at the skin surface (coronal series 3 images 42 through 45, axial series 5 images 13 through 15). This irregular collection measures up to approximately 10 cm in  greatest transverse dimension. It extends up the lateral thigh approximately 8 cm and up the medial thigh approximately 5.5 cm (as measured on coronal series 3 images 57 and 49, respectively). Ligaments Suboptimally assessed by CT. Muscles and Tendons There is heterogeneous partially rim enhancing low-density within the medial aspect of the distal right sartorius muscle that again extends to the medial skin surface (axial series 2, images 52 through 66). The lowest density, rim enhancing portion measures up to 1.5 x 1.2  x 6.0 cm (transverse by AP by craniocaudal, axial series 2, image 54 and coronal series 3, image 54), decreased from prior. This again may extend and drained to the distal medial thigh surgical incision site (axial series 2 images 57 through 64). There is fluid tracking up the lateral right thigh in between the lateral subcutaneous fat in the vastus lateralis muscle, overall measuring up to approximately 3 x 9 x 19 cm (transverse by AP by craniocaudal, axial series 2, image 47 and coronal series 3, image 63). There is similar fluid tracking along the lateral left thigh in between the deep aspect of the subcutaneous fat in the vastus lateralis muscle, measuring up to approximately 2 cm in transverse dimension and 18 cm in craniocaudal dimension. This is seen on the coronal images but is off the lateral plane of view of the axial series 2. There is interval decrease in now minimal complex fluid anteromedial to the sartorius and vastus medialis muscles that minimally extends through the distal anteromedial thigh skin surface (axial series 2, images 44 through 66). Soft tissues There is diffuse moderate subcutaneous fat edema and swelling. Fluid distends the rectum, and there is moderate rectal wall thickening as seen on today's CT abdomen and pelvis. IMPRESSION: Compared to 11/01/2022: 1. Interval postsurgical changes of revised, now bilateral above-the-knee amputation. 2. There are bilateral  low-density collections just distal to the amputation stumps extending up the distal lateral greater than medial thigh. These collections contain moderate scattered air and are suspicious for abscesses. These collections appear to extend to the prior surgical incision at the distal stump, possibly draining to the skin surface. 3. There is complex fluid tracking between the lateral thigh subcutaneous fat and the vastus lateralis muscles bilaterally, as above. 4. Interval decrease in size of small complex fluid collections superficial to the anteromedial aspect of the right sartorius muscle and the junction of the left sartorius and vastus medialis muscles, likely minimally draining to the anteromedial thigh skin surface. Electronically Signed   By: Neita Garnet M.D.   On: 11/26/2022 14:56   DG Chest Portable 1 View  Result Date: 11/26/2022 CLINICAL DATA:  Tachycardia, vomiting, diaphoretic EXAM: PORTABLE CHEST 1 VIEW COMPARISON:  10/08/2022 FINDINGS: Diffuse symmetric and bilateral interstitial opacities favored to be interstitial edema/early CHF. Mild cardiac enlargement. No large effusion or pneumothorax. Trachea midline. Monitor leads overlie the chest. IMPRESSION: Cardiomegaly with diffuse interstitial edema pattern. Electronically Signed   By: Judie Petit.  Shick M.D.   On: 11/26/2022 14:54   CT ABDOMEN PELVIS W CONTRAST  Result Date: 11/26/2022 CLINICAL DATA:  Vomiting and generalized abdominal pain 1 week after bilateral above knee amputation. EXAM: CT ABDOMEN AND PELVIS WITH CONTRAST TECHNIQUE: Multidetector CT imaging of the abdomen and pelvis was performed using the standard protocol following bolus administration of intravenous contrast. RADIATION DOSE REDUCTION: This exam was performed according to the departmental dose-optimization program which includes automated exposure control, adjustment of the mA and/or kV according to patient size and/or use of iterative reconstruction technique. CONTRAST:   OMNIPAQUE IOHEXOL 300 MG/ML  SOLN COMPARISON:  April 05, 2022. FINDINGS: Lower chest: Minimal bibasilar subsegmental atelectasis or edema is noted. Hepatobiliary: No focal liver abnormality is seen. No gallstones, gallbladder wall thickening, or biliary dilatation. Pancreas: Unremarkable. No pancreatic ductal dilatation or surrounding inflammatory changes. Spleen: Normal in size without focal abnormality. Adrenals/Urinary Tract: Adrenal glands are unremarkable. Kidneys are normal, without renal calculi, focal lesion, or hydronephrosis. Bladder is unremarkable. Stomach/Bowel: The stomach is unremarkable. The appendix  appears normal. There is no evidence of bowel obstruction. Moderate wall thickening of the colon and rectum is noted most consistent with infectious or inflammatory colitis. Vascular/Lymphatic: No significant vascular findings are present. No enlarged abdominal or pelvic lymph nodes. Reproductive: Prostate is unremarkable. Other: No ascites or hernia is noted.  Mild anasarca is noted. Musculoskeletal: No acute or significant osseous findings. IMPRESSION: Moderate diffuse colonic wall thickening is noted suggesting infectious or inflammatory colitis. Minimal bibasilar subsegmental atelectasis or edema is noted. Electronically Signed   By: Lupita Raider M.D.   On: 11/26/2022 14:07    Procedures Procedures    Medications Ordered in ED Medications  metroNIDAZOLE (FLAGYL) IVPB 500 mg (0 mg Intravenous Stopped 11/26/22 1400)  ceFEPIme (MAXIPIME) 2 g in sodium chloride 0.9 % 100 mL IVPB (has no administration in time range)  vancomycin (VANCOREADY) IVPB 1750 mg/350 mL (has no administration in time range)  vancomycin (VANCOREADY) IVPB 1250 mg/250 mL (has no administration in time range)  droperidol (INAPSINE) 2.5 MG/ML injection 2.5 mg (2.5 mg Intravenous Given 11/26/22 0837)  sodium chloride 0.9 % bolus 1,000 mL (0 mLs Intravenous Stopped 11/26/22 0942)  metoCLOPramide (REGLAN) injection 10 mg  (10 mg Intravenous Given 11/26/22 1028)  sodium chloride 0.9 % bolus 1,000 mL (0 mLs Intravenous Stopped 11/26/22 1241)  iohexol (OMNIPAQUE) 300 MG/ML solution 100 mL (100 mLs Intravenous Contrast Given 11/26/22 1156)  HYDROmorphone (DILAUDID) injection 1 mg (1 mg Intravenous Given 11/26/22 1603)    ED Course/ Medical Decision Making/ A&P Clinical Course as of 11/26/22 1647  Wed Nov 26, 2022  0830 CBC(!) Elevation in WBC, suspect reactionary to n/v and recent BKA. Appears not to far off from recent CBCs [TY]  0844 Comprehensive metabolic panel(!) Reassuring.  No significant metabolic derangements.  Hyperglycemia, but no anion gap to suggest DKA.  No transaminitis to suggest hepatobiliary disease [TY]  0845 Lipase: 22 Not elevated; pancreatitis unlikely [TY]  0845 Blood gas, venous (at [facility] and [facility])(!) Not consistent with DKA. [TY]  1016 Patient has had 4 episodes of diarrhea since arrival to the emergency department.  Recently with antibiotics.  Will get stool studies/C. difficile.  Remains persistently tachycardic despite IV fluids.  Will add on lactate and cultures.  She is afebrile.  While I think symptoms with nausea vomiting secondary to gastroparesis out of abundance of caution we will perform CT abdomen. In addition to diarrhea, also continues to have n/v. Will give further IV fluids and Reglan [TY]  1225 C Diff antigen(!): POSITIVE [TY]  1424 CT ABDOMEN PELVIS W CONTRAST IMPRESSION: Moderate diffuse colonic wall thickening is noted suggesting infectious or inflammatory colitis.  Minimal bibasilar subsegmental atelectasis or edema is noted.   [TY]  1505 Spoke with dr. Fernand Parkins hospitalist; requesting touching base with ortho.  [TY]  [id number] Spoke with on-call Ortho for Dr. Lajoyce Corners.  Will look at images and speak with Dr. Lajoyce Corners. [TY]  1646 Given CT scan findings added on broad-spectrum antibiotics.  Was able to speak with Ortho, no acute surgical intervention planned at this time.   Recommending outpatient follow-up.  Reach back out to Dr. Laural Benes.  Patient to be admitted for C. difficile, intractable nausea vomiting diarrhea and SIRS/sepsis [TY]    Clinical Course User Index [TY] Coral Spikes, DO                                 Medical Decision Making 32 year old female  present emergency department for nausea vomiting generalized abdominal pain.  Per chart review has history of diabetes and gastroparesis.  Heart rate with mild elevation, hypertensive.  But no fever.  Some mild generalized abdominal tenderness.  Per chart review has had numerous CT scans in the past year and 1/2 months which have largely been reassuring, some findings of colitis.  Suspect symptoms today also gastroparesis/chronic.  Also appears for visit at [facility] there was some concern for drug-seeking behavior.  Patient was asking for Dilaudid by name.  Blood sugar in the 200s.  Will get screening labs, evaluate for DKA.  Her QTc is 416, will give droperidol and IV fluids.  See ED course for further MDM disposition.  Amount and/or Complexity of Data Reviewed Labs: ordered. Decision-making details documented in ED Course. Radiology: ordered. Decision-making details documented in ED Course. ECG/medicine tests: ordered.  Risk Prescription drug management. Decision regarding hospitalization.          Final Clinical Impression(s) / ED Diagnoses Final diagnoses:  None    Rx / DC Orders ED Discharge Orders     None         Coral Spikes, DO 11/26/22 1647

## 2022-11-26 NOTE — ED Notes (Signed)
Pt has had 3 total bm's. Unable to give urine sample at this time.

## 2022-11-26 NOTE — ED Notes (Signed)
Pt had a large BM, unable to obtain urine specimen due to urine being contaminated with stool. Pt still aware we need urine sample.

## 2022-11-26 NOTE — Progress Notes (Signed)
Pharmacy Antibiotic Note  Kelli Hudson is a 32 y.o. female w/ PMH of DM1, diabetic gastroparesis with very frequent admissions in the past, HTN admitted on 11/26/2022 with cellulitis.  Pharmacy has been consulted for meropenem dosing.  Plan: start meropenem 1 gram IV every 8 hours ---follow renal function for needed dose adjustments    Temp (24hrs), Avg:97.8 F (36.6 C), Min:97.7 F (36.5 C), Max:97.8 F (36.6 C)  Recent Labs  Lab 11/20/22 0808 11/21/22 0830 11/22/22 0420 11/26/22 0813 11/26/22 1059  WBC 19.5*  --  15.3* 17.9*  --   CREATININE 0.93 0.72 0.87 0.74  --   LATICACIDVEN  --   --   --   --  1.0    CrCl cannot be calculated (Unknown ideal weight.).    Allergies  Allergen Reactions   Ibuprofen Other (See Comments)    07/23/22 - Worsening kidney function after 3 doses of ibuprofen 400mg .  Creatinine increased from 1.3 to 2.  Creatinine returned to normal after stopping.   Lisinopril Cough, Other (See Comments), Itching and Rash    Breakout in rash    Antimicrobials this admission: cefepime, metronidazole 10/9 x 1 vancomycin 10/9 IV x 1  vancomycin 10/9 po >> meropenem 10/9 >> metronidazole 10/9 >>  Microbiology results: 10/9 BCx: pending    Cdiff antigen +, toxin (-) Cdiff PCR: +  Thank you for allowing pharmacy to be a part of this patient's care.  Lowella Bandy 11/26/2022 6:38 PM

## 2022-11-26 NOTE — ED Notes (Signed)
Provided patient with water for PO trial due to medications being PO. Patient unable to tolerate water consumption without emesis. PO medications held at this time

## 2022-11-26 NOTE — ED Notes (Signed)
Called [place] [facility] on call Doc for Dr. Lajoyce Corners

## 2022-11-26 NOTE — ED Provider Notes (Signed)
I discussed the care with Dr. Randol Kern, he will admit the patient to the hospital, likely transfer to [facility].   Eber Hong, MD 11/26/22 [phone]

## 2022-11-26 NOTE — Inpatient Diabetes Management (Signed)
Inpatient Diabetes Program Recommendations  AACE/ADA: New Consensus Statement on Inpatient Glycemic Control (2015)  Target Ranges:  Prepandial:   less than 140 mg/dL      Peak postprandial:   less than 180 mg/dL (1-2 hours)      Critically ill patients:  140 - 180 mg/dL   Lab Results  Component Value Date   GLUCAP 268 (H) 11/26/2022   HGBA1C 5.9 (H) 11/18/2022    Review of Glycemic Control  Latest Reference Range & Units 11/26/22 08:13  Glucose 70 - 99 mg/dL 161 (H)   Diabetes history: DM type 1 Outpatient Diabetes medications: Levemir 4 units bid Current orders for Inpatient glycemic control:  None being evaluated in the ED  Inpatient Diabetes Program Recommendations:    -   Start Semglee 3 units bid -   Start Novolog 0-6 units + hs  Thanks,  Christena Deem RN, MSN, BC-ADM Inpatient Diabetes Coordinator Team Pager [phone] (8a-5p)

## 2022-11-27 ENCOUNTER — Encounter (HOSPITAL_COMMUNITY): Payer: Self-pay | Admitting: Internal Medicine

## 2022-11-27 DIAGNOSIS — A419 Sepsis, unspecified organism: Secondary | ICD-10-CM | POA: Diagnosis not present

## 2022-11-27 DIAGNOSIS — E876 Hypokalemia: Secondary | ICD-10-CM | POA: Diagnosis not present

## 2022-11-27 LAB — BLOOD CULTURE ID PANEL (REFLEXED) - BCID2

## 2022-11-27 LAB — COMPREHENSIVE METABOLIC PANEL
ALT: 27 U/L (ref 0–44)
AST: 21 U/L (ref 15–41)
Albumin: 1.6 g/dL — ABNORMAL LOW (ref 3.5–5.0)
Alkaline Phosphatase: 462 U/L — ABNORMAL HIGH (ref 38–126)
Anion gap: 10 (ref 5–15)
BUN: 24 mg/dL — ABNORMAL HIGH (ref 6–20)
CO2: 18 mmol/L — ABNORMAL LOW (ref 22–32)
Calcium: 8.7 mg/dL — ABNORMAL LOW (ref 8.9–10.3)
Chloride: 110 mmol/L (ref 98–111)
Creatinine, Ser: 0.81 mg/dL (ref 0.44–1.00)
GFR, Estimated: >60 mL/min (ref >60)
Glucose, Bld: 223 mg/dL — ABNORMAL HIGH (ref 70–99)
Potassium: 3.2 mmol/L — ABNORMAL LOW (ref 3.5–5.1)
Sodium: 138 mmol/L (ref 135–145)
Total Bilirubin: 0.9 mg/dL (ref 0.3–1.2)
Total Protein: 7.7 g/dL (ref 6.5–8.1)

## 2022-11-27 LAB — CBG MONITORING, ED
Glucose-Capillary: 206 mg/dL — ABNORMAL HIGH (ref 70–99)
Glucose-Capillary: 133 mg/dL — ABNORMAL HIGH (ref 70–99)

## 2022-11-27 LAB — CBC
HCT: 30.5 % — ABNORMAL LOW (ref 36.0–46.0)
Hemoglobin: 9.9 g/dL — ABNORMAL LOW (ref 12.0–15.0)
MCH: 30.5 pg (ref 26.0–34.0)
MCHC: 32.5 g/dL (ref 30.0–36.0)
MCV: 93.8 fL (ref 80.0–100.0)
Platelets: 882 10*3/uL — ABNORMAL HIGH (ref 150–400)
RBC: 3.25 MIL/uL — ABNORMAL LOW (ref 3.87–5.11)
RDW: 13.6 % (ref 11.5–15.5)
WBC: 21 10*3/uL — ABNORMAL HIGH (ref 4.0–10.5)
nRBC: 0 % (ref 0.0–0.2)

## 2022-11-27 LAB — GASTROINTESTINAL PANEL BY PCR, STOOL (REPLACES STOOL CULTURE)

## 2022-11-27 LAB — GLUCOSE, CAPILLARY
Glucose-Capillary: 95 mg/dL (ref 70–99)
Glucose-Capillary: 84 mg/dL (ref 70–99)

## 2022-11-27 MED ORDER — DAPTOMYCIN-SODIUM CHLORIDE 700-0.9 MG/100ML-% IV SOLN
700.0000 mg | Freq: Every day | INTRAVENOUS | Status: DC
  Filled 2022-11-27 (×2): qty 100

## 2022-11-27 MED ORDER — DAPTOMYCIN-SODIUM CHLORIDE 700-0.9 MG/100ML-% IV SOLN
700.0000 mg | Freq: Every day | INTRAVENOUS | Status: DC
  Administered 2022-11-27: 700 mg via INTRAVENOUS
  Filled 2022-11-27 (×2): qty 100

## 2022-11-27 MED ORDER — POTASSIUM CHLORIDE CRYS ER 20 MEQ PO TBCR
40.0000 meq | EXTENDED_RELEASE_TABLET | Freq: Once | ORAL | Status: AC
  Administered 2022-11-27: 40 meq via ORAL
  Filled 2022-11-27: qty 2

## 2022-11-27 MED ORDER — RISAQUAD PO CAPS
2.0000 | ORAL_CAPSULE | Freq: Three times a day (TID) | ORAL | Status: DC
  Administered 2022-11-27 (×3): 2 via ORAL
  Filled 2022-11-27 (×3): qty 2

## 2022-11-27 MED ORDER — FENTANYL 50 MCG/HR TD PT72
1.0000 | MEDICATED_PATCH | TRANSDERMAL | Status: DC
  Administered 2022-11-29 – 2022-12-02 (×2): 1 via TRANSDERMAL
  Filled 2022-11-27 (×2): qty 1

## 2022-11-27 MED ORDER — LACTATED RINGERS IV SOLN: INTRAVENOUS | Status: AC

## 2022-11-27 NOTE — ED Notes (Signed)
Patient noted to continuously ask for the nurse to administer her PRN pain medication "just a little bit early". Patient is reminded each time that the medication cannot be given prior to the 3 hour mark per the order. Patient also has requested this nurse to "push her medication faster" during the administration of her medication. This nurse refused and explained that the medication must be given at a certain rate and could not be administered quickly

## 2022-11-27 NOTE — TOC Initial Note (Signed)
Transition of Care [facility]) - Initial/Assessment Note    Patient Details  Name: Kelli Hudson MRN: [id number] Date of Birth: 1990-03-06  Transition of Care [facility]) CM/SW Contact:    Leitha Bleak, RN Phone Number: 11/27/2022, 2:34 PM  Clinical Narrative:          Patient admitted with Sepsis, from [facility]. CM spoke with Eunice Blase, they need PT eval to restart [employer] auth. Fayrene Fearing will see patient. TOC following.          Expected Discharge Plan: Skilled Nursing Facility Barriers to Discharge: Continued Medical Work up   Patient Goals and CMS Choice Patient states their goals for this hospitalization and ongoing recovery are:: return to [email or web address].gov Compare Post Acute Care list provided to:: Patient Choice offered to / list presented to : Patient    xpected Discharge Plan and Services     Post Acute Care Choice: Skilled Nursing Facility           Orientation: : Oriented to Self, Oriented to Place, Oriented to  Time, Oriented to Situation Alcohol / Substance Use: Not Applicable Psych Involvement: No (comment)  Admission diagnosis:  Sepsis (HCC) [A41.9] Patient Active Problem List   Diagnosis Date Noted   Hypokalemia 11/27/2022   Effusion, left knee 11/05/2022   MDD (major depressive disorder), recurrent episode, moderate (HCC) 10/16/2022   Necrotizing fasciitis of pelvic region and thigh (HCC) 10/08/2022   Necrotizing fasciitis of lower leg (HCC) 10/08/2022   Necrotizing fasciitis (HCC) 10/08/2022   Sepsis due to cellulitis (HCC) 10/03/2022   Cellulitis of lower extremity 10/03/2022   Multinodular goiter 06/18/2022   Malnutrition of moderate degree 04/18/2022   Intractable vomiting with nausea 04/17/2022   DKA (diabetic ketoacidosis) (HCC) 03/04/2022   Refractory nausea and vomiting 12/25/2021   Diabetic gastroparesis (HCC) 12/07/2021   Intractable nausea and vomiting 10/09/2021   Drug-seeking behavior 09/21/2021   Essential  hypertension 08/25/2021   GERD without esophagitis 08/25/2021   Hematemesis    Prolonged Q-T interval on ECG    Nausea and vomiting 07/20/2021   Hypomagnesemia 04/21/2021   Ulcerated, foot, left, with fat layer exposed (HCC) 04/20/2021   Uncontrolled type 1 diabetes mellitus with hyperglycemia, with long-term current use of insulin (HCC) 12/18/2020   DM type 1 (diabetes mellitus, type 1) (HCC) 12/18/2020   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) 11/09/2020   Diabetic retinopathy of both eyes associated with type 1 diabetes mellitus (HCC) 09/24/2020   Diabetic gastroparesis associated with type 1 diabetes mellitus (HCC) 08/12/2020   AKI (acute kidney injury) (HCC) 07/25/2020   Generalized abdominal pain 07/23/2020   Diabetic nephropathy associated with type 1 diabetes mellitus (HCC) 06/27/2020   Sepsis (HCC) 05/27/2020   HLD (hyperlipidemia) 05/23/2020   Sinus tachycardia 05/09/2020   Anemia 04/20/2020   Depression with anxiety 07/05/2019   Diabetic polyneuropathy associated with type 1 diabetes mellitus (HCC) 09/24/2017   Gastroparesis    Goiter 10/05/2009   Hypertension associated with diabetes (HCC) 10/05/2009   PCP:  Norm Salt, PA Pharmacy:   [facility] [phone] - [place], [place] - [address] [place]  [place] [address] Phone: [phone] Fax: [phone]  [place] [facility] [address] [address] Phone: [phone] Fax: [phone]  [facility] [address] [place] [address] Phone: [phone] Fax: [phone]   Social Determinants of Health (SDOH) Social History: SDOH Screenings  Food Insecurity: No Food Insecurity (10/03/2022)  Housing: Low Risk  (10/03/2022)  Transportation Needs: No Transportation Needs (10/03/2022)  Utilities: Not At Risk (10/03/2022)  Alcohol Screen: Low Risk  (05/29/2021)   Depression (PHQ2-9): Low Risk  (05/29/2021)  Recent Concern: Depression (PHQ2-9) - Medium Risk (04/10/2021)  Financial Resource Strain: Low Risk  (02/13/2022)   Received from [facility]  Social Connections: Unknown (06/17/2021)   Received from [facility]  Stress: No Stress Concern Present (09/16/2022)   Received from [facility]  Tobacco Use: Low Risk  (11/27/2022)   SDOH Interventions:    Readmission Risk Interventions    11/27/2022    2:33 PM 05/08/2022    9:45 AM 03/24/2022    3:33 PM  Readmission Risk Prevention Plan  Transportation Screening Complete Complete Complete  PCP or Specialist Appt within 3-5 Days Not Complete    HRI or Home Care Consult Complete    Social Work Consult for Recovery Care Planning/Counseling Complete    Palliative Care Screening Not Applicable    Medication Review Oceanographer) Complete Complete Complete  PCP or Specialist appointment within 3-5 days of discharge  Complete Complete  HRI or Home Care Consult  Complete Complete  SW Recovery Care/Counseling Consult  Complete Complete  Palliative Care Screening  Not Applicable Not Applicable  Skilled Nursing Facility  Not Applicable Not Applicable

## 2022-11-27 NOTE — ED Notes (Signed)
ED TO INPATIENT HANDOFF REPORT  ED Nurse Name and Phone #:  Beverley Fiedler Name/Age/Gender Kelli Hudson 32 y.o. female Room/Bed: APA05/APA05  Code Status   Code Status: Full Code  Home/SNF/Other Skilled nursing facility Patient oriented to: self, place, time, and situation Is this baseline? Yes   Triage Complete: Triage complete  Chief Complaint Sepsis [facility]) [A41.9]  Triage Note Pt BIB EMS from [facility]. 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 Pt has been vomiting all morning. Hx of gastroparesis. Pt having severe abd pain all over. .    Allergies Allergies  Allergen Reactions   Ibuprofen Other (See Comments)    07/23/22 - Worsening kidney function after 3 doses of ibuprofen 400mg .  Creatinine increased from 1.3 to 2.  Creatinine returned to normal after stopping.   Lisinopril Cough, Other (See Comments), Itching and Rash    Breakout in rash    Level of Care/Admitting Diagnosis ED Disposition     ED Disposition  Admit   Condition  --   Comment  Hospital Area: [facility] [[id number]]  Level of Care: Progressive [102]  Admit to Progressive based on following criteria: MULTISYSTEM THREATS such as stable sepsis, metabolic/electrolyte imbalance with or without encephalopathy that is responding to early treatment.  May admit patient to [facility] or [facility] if equivalent level of care is available:: No  Covid Evaluation: Asymptomatic - no recent exposure (last 10 days) testing not required  Diagnosis: Sepsis [facility]) [[id number]]  Admitting Physician: Chiquita Loth  Attending Physician: Starleen Arms [[id number]]  Certification:: I certify this patient will need inpatient services for at least 2 midnights  Expected Medical Readiness: 12/02/2022          B Medical/Surgery History Past Medical History:  Diagnosis Date   Acute H. pylori gastric ulcer    Coffee ground emesis    Diabetes mellitus (HCC)     Diabetic gastroparesis (HCC)    DKA (diabetic ketoacidosis) (HCC) 02/24/2021   Gastroparesis    GERD (gastroesophageal reflux disease)    Hypertension    Hyperthyroidism    Intractable nausea and vomiting 04/20/2021   Normocytic anemia 04/20/2020   Prolonged Q-T interval on ECG    Past Surgical History:  Procedure Laterality Date   AMPUTATION Bilateral 10/24/2022   Procedure: BILATERAL BELOW KNEE AMPUTATION;  Surgeon: Nadara Mustard, MD;  [facility] OR;  Service: Orthopedics;  Laterality: Bilateral;   AMPUTATION Bilateral 10/08/2022   Procedure: BILATERAL LEG DEBRIDEMENT;  Surgeon: Nadara Mustard, MD;  Location: [facility] OR;  Service: Orthopedics;  Laterality: Bilateral;   AMPUTATION Bilateral 11/14/2022   Procedure: BILATERAL ABOVE KNEE AMPUTATION;  Surgeon: Nadara Mustard, MD;  Location: [facility] OR;  Service: Orthopedics;  Laterality: Bilateral;   AMPUTATION TOE Left 03/10/2018   Procedure: AMPUTATION FIFTH TOE;  Surgeon: Vivi Barrack, DPM;  Location: [facility];  Service: Podiatry;  Laterality: Left;   APPLICATION OF WOUND VAC Bilateral 10/12/2022   Procedure: APPLICATION OF WOUND VAC BILATERAL LEGS;  Surgeon: Myrene Galas, MD;  Location: [facility] OR;  Service: Orthopedics;  Laterality: Bilateral;   APPLICATION OF WOUND VAC Bilateral 10/24/2022   Procedure: APPLICATION OF WOUND VAC;  Surgeon: Nadara Mustard, MD;  Location: [facility];  Service: Orthopedics;  Laterality: Bilateral;   BIOPSY  01/28/2020   Procedure: BIOPSY;  Surgeon: Kathi Der, MD;  Location: [facility];  Service: Gastroenterology;;   BOTOX INJECTION  08/26/2021   Procedure: BOTOX  INJECTION;  Surgeon: Sherrilyn Rist, MD;  Location: [facility];  Service: Gastroenterology;;   ESOPHAGOGASTRODUODENOSCOPY N/A 01/28/2020   Procedure: ESOPHAGOGASTRODUODENOSCOPY (EGD);  Surgeon: Kathi Der, MD;  Location: [facility];  Service: Gastroenterology;  Laterality: N/A;   ESOPHAGOGASTRODUODENOSCOPY (EGD) WITH  PROPOFOL Left 09/08/2015   Procedure: ESOPHAGOGASTRODUODENOSCOPY (EGD) WITH PROPOFOL;  Surgeon: Willis Modena, MD;  Location[facility];  Service: Endoscopy;  Laterality: Left;   ESOPHAGOGASTRODUODENOSCOPY (EGD) WITH PROPOFOL N/A 08/26/2021   Procedure: ESOPHAGOGASTRODUODENOSCOPY (EGD) WITH PROPOFOL;  Surgeon: Sherrilyn Rist, MD;  Location: [facility];  Service: Gastroenterology;  Laterality: N/A;   GASTRIC STIMULATOR IMPLANT SURGERY  06/2022   at [facility]   I & D EXTREMITY Bilateral 10/12/2022   Procedure: IRRIGATION AND  DEBRIDEMENT  BILATERAL LEGS;  Surgeon: Myrene Galas, MD;  Location: [facility] OR;  Service: Orthopedics;  Laterality: Bilateral;   I & D EXTREMITY Right 10/13/2022   Procedure: RIGHT THIGH WOUND VAC EXCHANGE;  Surgeon: Roby Lofts, MD;  Location: [facility];  Service: Orthopedics;  Laterality: Right;   I & D EXTREMITY Bilateral 10/17/2022   Procedure: BILATERAL THIGH AND LEG DEBRIDEMENT, PARTIAL BILATERAL FOOT AMPUTATION;  Surgeon: Nadara Mustard, MD;  Location[facility];  Service: Orthopedics;  Laterality: Bilateral;   I & D EXTREMITY Bilateral 11/05/2022   Procedure: BILATERAL THIGH DEBRIDEMENT;  Surgeon: Nadara Mustard, MD;  Location: [facility] OR;  Service: Orthopedics;  Laterality: Bilateral;   I & D EXTREMITY Bilateral 11/07/2022   Procedure: BILATERAL THIGH AND LEG DEBRIDEMENT;  Surgeon: Nadara Mustard, MD;  Location: [facility] OR;  Service: Orthopedics;  Laterality: Bilateral;   I & D EXTREMITY Bilateral 11/12/2022   Procedure: BILATERAL LEG DEBRIDEMENTS;  Surgeon: Nadara Mustard, MD;  Location: [facility] OR;  Service: Orthopedics;  Laterality: Bilateral;   IRRIGATION AND DEBRIDEMENT FOOT Bilateral 10/05/2022   Procedure: IRRIGATION AND DEBRIDEMENT FOOT;  Surgeon: [name], DPM;  Location: [facility];  Service: Orthopedics/Podiatry;  Laterality: Bilateral;   WISDOM TOOTH EXTRACTION       A IV Location/Drains/Wounds Patient Lines/Drains/Airways Status     Active Line/Drains/Airways     Name  Placement date Placement time Site Days   Peripheral IV 11/26/22 1.88" Anterior;Proximal;Right Forearm 11/26/22  1548  Forearm  1            Intake/Output Last 24 hours  Intake/Output Summary (Last 24 hours) at 11/27/2022 1413 Last data filed at 11/27/2022 1320 Gross per 24 hour  Intake 728.19 ml  Output --  Net 728.19 ml    Labs/Imaging Results for orders placed or performed during the hospital encounter of 11/26/22 (from the past 48 hour(s))  POC CBG, ED     Status: Abnormal   Collection Time: 11/26/22  8:01 AM  Result Value Ref Range   Glucose-Capillary 268 (H) 70 - 99 mg/dL    Comment: Glucose reference range applies only to samples taken after fasting for at least 8 hours.  CBC     Status: Abnormal   Collection Time: 11/26/22  8:13 AM  Result Value Ref Range   WBC 17.9 (H) 4.0 - 10.5 K/uL   RBC 2.74 (L) 3.87 - 5.11 MIL/uL   Hemoglobin 8.5 (L) 12.0 - 15.0 g/dL   HCT 13.2 (L) 44.0 - 10.2 %   MCV 94.2 80.0 - 100.0 fL   MCH 31.0 26.0 - 34.0 pg   MCHC 32.9 30.0 - 36.0 g/dL   RDW 72.5 36.6 - 44.0 %   Platelets 742 (H)  150 - 400 K/uL   nRBC 0.0 0.0 - 0.2 %    Comment: Performed at [facility], [address], [place], [place] [address]  Comprehensive metabolic panel     Status: Abnormal   Collection Time: 11/26/22  8:13 AM  Result Value Ref Range   Sodium 133 (L) 135 - 145 mmol/L   Potassium 4.3 3.5 - 5.1 mmol/L   Chloride 107 98 - 111 mmol/L   CO2 21 (L) 22 - 32 mmol/L   Glucose, Bld 298 (H) 70 - 99 mg/dL    Comment: Glucose reference range applies only to samples taken after fasting for at least 8 hours.   BUN 26 (H) 6 - 20 mg/dL   Creatinine, Ser 3.47 0.44 - 1.00 mg/dL   Calcium 8.1 (L) 8.9 - 10.3 mg/dL   Total Protein 7.1 6.5 - 8.1 g/dL   Albumin <4.2 (L) 3.5 - 5.0 g/dL   AST 25 15 - 41 U/L   ALT 31 0 - 44 U/L   Alkaline Phosphatase 492 (H) 38 - 126 U/L   Total Bilirubin 0.5 0.3 - 1.2 mg/dL   GFR, Estimated >59 >56 mL/min    Comment: (NOTE) Calculated  using the CKD-EPI Creatinine Equation (2021)    Anion gap 5 5 - 15    Comment: Performed at [facility], [address], [place], [place] [address]  Lipase, blood     Status: None   Collection Time: 11/26/22  8:13 AM  Result Value Ref Range   Lipase 22 11 - 51 U/L    Comment: Performed at [facility], [address] [place], [place] [address]  hCG, serum, qualitative     Status: None   Collection Time: 11/26/22  8:13 AM  Result Value Ref Range   Preg, Serum NEGATIVE NEGATIVE    Comment:        THE SENSITIVITY OF THIS METHODOLOGY IS >10 mIU/mL. Performed at [facility], [address], [place], [place] [address]   Blood gas, venous (at [facility] and [facility])     Status: Abnormal   Collection Time: 11/26/22  8:29 AM  Result Value Ref Range   pH, Ven 7.39 7.25 - 7.43   pCO2, Ven 38 (L) 44 - 60 mmHg   pO2, Ven 42 32 - 45 mmHg   Bicarbonate 23.0 20.0 - 28.0 mmol/L   Acid-base deficit 1.7 0.0 - 2.0 mmol/L   O2 Saturation 77.8 %   Patient temperature 36.8    Collection site RIGHT ANTECUBITAL    Drawn by [phone]     Comment: Performed at [facility], [address], [place], [place] [address]  Lactic acid, plasma     Status: None   Collection Time: 11/26/22 10:59 AM  Result Value Ref Range   Lactic Acid, Venous 1.0 0.5 - 1.9 mmol/L    Comment: Performed at [facility], [address], [place], [place] [address]  Culture, blood (routine x 2)     Status: None (Preliminary result)   Collection Time: 11/26/22 10:59 AM   Specimen: BLOOD LEFT HAND  Result Value Ref Range   Specimen Description      BLOOD LEFT HAND Performed at [facility], [address], [place], [place] [address]    Special Requests      BOTTLES DRAWN AEROBIC ONLY Blood Culture adequate volume Performed at [facility], [address] [place], [place] [address]    Culture  Setup Time      GRAM POSITIVE  COCCI IN CLUSTERS AEROBIC BOTTLE ONLY CRITICAL RESULT CALLED TO, READ BACK BY AND VERIFIED WITH: Forest Gleason  [phone], VIRAY,J Organism ID to follow Performed at [facility], [address], [place], [place] [address]    Culture GRAM POSITIVE COCCI IN CLUSTERS    Report Status PENDING   Culture, blood (routine x 2)     Status: None (Preliminary result)   Collection Time: 11/26/22 10:59 AM   Specimen: BLOOD  Result Value Ref Range   Specimen Description BLOOD LT HAND    Special Requests      BOTTLES DRAWN AEROBIC AND ANAEROBIC Blood Culture adequate volume   Culture      NO GROWTH < 24 HOURS Performed at [facility], [address], [place], [place] [address]    Report Status PENDING   hCG, quantitative, pregnancy     Status: None   Collection Time: 11/26/22 10:59 AM  Result Value Ref Range   hCG, Beta Chain, Quant, S 3 <5 mIU/mL    Comment:          GEST. AGE      CONC.  (mIU/mL)   <=1 WEEK        5 - 50     2 WEEKS       50 - 500     3 WEEKS       100 - 10,000     4 WEEKS     1,000 - 30,000     5 WEEKS     3,500 - 115,000   6-8 WEEKS     12,000 - 270,000    12 WEEKS     15,000 - 220,000        FEMALE AND NON-PREGNANT FEMALE:     LESS THAN 5 mIU/mL Performed at [facility], [address], [place], [place] [address]   Blood Culture ID Panel (Reflexed)     Status: Abnormal   Collection Time: 11/26/22 10:59 AM  Result Value Ref Range   Enterococcus faecalis NOT DETECTED NOT DETECTED   Enterococcus Faecium NOT DETECTED NOT DETECTED   Listeria monocytogenes NOT DETECTED NOT DETECTED   Staphylococcus species DETECTED (A) NOT DETECTED    Comment: CRITICAL RESULT CALLED TO, READ BACK BY AND VERIFIED WITH: L. POOLE PHARMD, AT 1057 11/27/22 D. VANHOOK    Staphylococcus aureus (BCID) NOT DETECTED NOT DETECTED   Staphylococcus epidermidis NOT DETECTED NOT DETECTED   Staphylococcus lugdunensis NOT DETECTED NOT DETECTED   Streptococcus species NOT DETECTED NOT DETECTED   Streptococcus agalactiae NOT DETECTED NOT DETECTED   Streptococcus pneumoniae NOT DETECTED NOT DETECTED    Streptococcus pyogenes NOT DETECTED NOT DETECTED   A.calcoaceticus-baumannii NOT DETECTED NOT DETECTED   Bacteroides fragilis NOT DETECTED NOT DETECTED   Enterobacterales NOT DETECTED NOT DETECTED   Enterobacter cloacae complex NOT DETECTED NOT DETECTED   Escherichia coli NOT DETECTED NOT DETECTED   Klebsiella aerogenes NOT DETECTED NOT DETECTED   Klebsiella oxytoca NOT DETECTED NOT DETECTED   Klebsiella pneumoniae NOT DETECTED NOT DETECTED   Proteus species NOT DETECTED NOT DETECTED   Salmonella species NOT DETECTED NOT DETECTED   Serratia marcescens NOT DETECTED NOT DETECTED   Haemophilus influenzae NOT DETECTED NOT DETECTED   Neisseria meningitidis NOT DETECTED NOT DETECTED   Pseudomonas aeruginosa NOT DETECTED NOT DETECTED   Stenotrophomonas maltophilia NOT DETECTED NOT DETECTED   Candida albicans NOT DETECTED NOT DETECTED   Candida auris NOT DETECTED NOT DETECTED   Candida glabrata NOT DETECTED NOT  DETECTED   Candida krusei NOT DETECTED NOT DETECTED   Candida parapsilosis NOT DETECTED NOT DETECTED   Candida tropicalis NOT DETECTED NOT DETECTED   Cryptococcus neoformans/gattii NOT DETECTED NOT DETECTED    Comment: Performed at [facility], [address], [place], [place] [address]  C Difficile Quick Screen w PCR reflex     Status: Abnormal   Collection Time: 11/26/22 11:01 AM   Specimen: STOOL  Result Value Ref Range   C Diff antigen POSITIVE (A) NEGATIVE   C Diff toxin NEGATIVE NEGATIVE   C Diff interpretation Results are indeterminate. See PCR results.     Comment: Performed at [facility], [address] [place], [place] [address]  Gastrointestinal Panel by PCR , Stool     Status: None   Collection Time: 11/26/22 11:01 AM   Specimen: STOOL  Result Value Ref Range   Campylobacter species NOT DETECTED NOT DETECTED   Plesimonas shigelloides NOT DETECTED NOT DETECTED   Salmonella species NOT DETECTED NOT DETECTED   Yersinia enterocolitica NOT DETECTED NOT DETECTED    Vibrio species NOT DETECTED NOT DETECTED   Vibrio cholerae NOT DETECTED NOT DETECTED   Enteroaggregative E coli (EAEC) NOT DETECTED NOT DETECTED   Enteropathogenic E coli (EPEC) NOT DETECTED NOT DETECTED   Enterotoxigenic E coli (ETEC) NOT DETECTED NOT DETECTED   Shiga like toxin producing E coli (STEC) NOT DETECTED NOT DETECTED   Shigella/Enteroinvasive E coli (EIEC) NOT DETECTED NOT DETECTED   Cryptosporidium NOT DETECTED NOT DETECTED   Cyclospora cayetanensis NOT DETECTED NOT DETECTED   Entamoeba histolytica NOT DETECTED NOT DETECTED   Giardia lamblia NOT DETECTED NOT DETECTED   Adenovirus F40/41 NOT DETECTED NOT DETECTED   Astrovirus NOT DETECTED NOT DETECTED   Norovirus GI/GII NOT DETECTED NOT DETECTED   Rotavirus A NOT DETECTED NOT DETECTED   Sapovirus (I, II, IV, and V) NOT DETECTED NOT DETECTED    Comment: Performed at [facility], [address] [place], [place] [address]  C. Diff by PCR, Reflexed     Status: Abnormal   Collection Time: 11/26/22 11:01 AM  Result Value Ref Range   Toxigenic C. Difficile by PCR POSITIVE (A) NEGATIVE    Comment: Positive for toxigenic C. difficile with little to no toxin production. Only treat if clinical presentation suggests symptomatic illness. Performed at [facility], [address] [place], [place] [address]   CBG monitoring, ED     Status: Abnormal   Collection Time: 11/26/22 11:08 PM  Result Value Ref Range   Glucose-Capillary 257 (H) 70 - 99 mg/dL    Comment: Glucose reference range applies only to samples taken after fasting for at least 8 hours.  Comprehensive metabolic panel     Status: Abnormal   Collection Time: 11/27/22  5:25 AM  Result Value Ref Range   Sodium 138 135 - 145 mmol/L   Potassium 3.2 (L) 3.5 - 5.1 mmol/L   Chloride 110 98 - 111 mmol/L   CO2 18 (L) 22 - 32 mmol/L   Glucose, Bld 223 (H) 70 - 99 mg/dL    Comment: Glucose reference range applies only to samples taken after fasting for at least  8 hours.   BUN 24 (H) 6 - 20 mg/dL   Creatinine, Ser 2.95 0.44 - 1.00 mg/dL   Calcium 8.7 (L) 8.9 - 10.3 mg/dL   Total Protein 7.7 6.5 - 8.1 g/dL   Albumin 1.6 (L) 3.5 - 5.0 g/dL   AST 21  15 - 41 U/L   ALT 27 0 - 44 U/L   Alkaline Phosphatase 462 (H) 38 - 126 U/L   Total Bilirubin 0.9 0.3 - 1.2 mg/dL   GFR, Estimated >16 >10 mL/min    Comment: (NOTE) Calculated using the CKD-EPI Creatinine Equation (2021)    Anion gap 10 5 - 15    Comment: Performed at [facility], [address] [place], [place] [address]  CBC     Status: Abnormal   Collection Time: 11/27/22  5:25 AM  Result Value Ref Range   WBC 21.0 (H) 4.0 - 10.5 K/uL   RBC 3.25 (L) 3.87 - 5.11 MIL/uL   Hemoglobin 9.9 (L) 12.0 - 15.0 g/dL   HCT 40.9 (L) 81.1 - 91.4 %   MCV 93.8 80.0 - 100.0 fL   MCH 30.5 26.0 - 34.0 pg   MCHC 32.5 30.0 - 36.0 g/dL   RDW 78.2 95.6 - 21.3 %   Platelets 882 (H) 150 - 400 K/uL   nRBC 0.0 0.0 - 0.2 %    Comment: Performed at [facility], [address] [place], [place] [address]  CBG monitoring, ED     Status: Abnormal   Collection Time: 11/27/22  8:09 AM  Result Value Ref Range   Glucose-Capillary 206 (H) 70 - 99 mg/dL    Comment: Glucose reference range applies only to samples taken after fasting for at least 8 hours.  CBG monitoring, ED     Status: Abnormal   Collection Time: 11/27/22 12:24 PM  Result Value Ref Range   Glucose-Capillary 133 (H) 70 - 99 mg/dL    Comment: Glucose reference range applies only to samples taken after fasting for at least 8 hours.   CT EXTREM LOWER W CM BIL  Result Date: 11/26/2022 CLINICAL DATA:  Soft tissue infection suspected, lower leg. X-ray done. Status post bilateral above the knee amputation 1 week ago. Patient has been vomiting all morning. EXAM: CT OF THE LOWER BILATERAL EXTREMITY WITH CONTRAST TECHNIQUE: Multidetector CT imaging of the lower bilateral extremity was performed according to the standard protocol following intravenous contrast  administration. RADIATION DOSE REDUCTION: This exam was performed according to the departmental dose-optimization program which includes automated exposure control, adjustment of the mA and/or kV according to patient size and/or use of iterative reconstruction technique. COMPARISON:  CT right tibia and fibula and right femur 11/01/2022, CT left tibia and fibula and left femur 11/01/2022 CONTRAST:  OMNIPAQUE IOHEXOL 300 MG/ML  SOLN FINDINGS: Bones/Joint/Cartilage Mild bilateral superolateral acetabular degenerative osteophytosis. Minimal superior pubic symphysis joint space narrowing. Interval postsurgical changes of bilateral above-the-knee amputation. Starting at the superior aspect of the femoral head, the right femur measures up to approximately 30.8 cm in the left femur measures up to approximately 30.3 cm. The distal femoral diaphyseal amputation margins are sharp. There are surgical skin staples at the distal aspect of each soft tissue distal amputation stump. Right lower extremity: There is a low-density collection just distal to the amputation stump and extending up the distal lateral greater than medial thigh (axial series 2 images 58 through 73 and axial series 5 images 1 through 17). This collection is of moderate density and contains moderate scattered air. There is mild peripheral rim enhancement. This is suspicious for an abscess, and appears to extend to the prior surgical incision at the distal stump, possibly draining to the skin surface (coronal series 7, images 43-45). The irregular size of this collection makes measurements difficult, however it appears  to measure up to approximately 11 cm in transverse dimension. Extends up the lateral thigh approximately 8.5 cm (as measured on coronal series 3, image 62) and the medial thigh up to approximately 4.6 cm. No cortical erosion to indicate acute osteomyelitis. Left lower extremity: Simple are to the contralateral side, there is a low-density  collection just distal to the amputation stump extending up the distal lateral greater than medial thigh (axial series 2 images 47 through 68 and axial series 5 images 1 through 12). This collection is similar moderate density and contains more mild scattered air compared to the contralateral leg. There is mild peripheral rim enhancement. A thin tract is seen from this fluid draining to the distal anterior amputation stump surgical incision at the skin surface (coronal series 3 images 42 through 45, axial series 5 images 13 through 15). This irregular collection measures up to approximately 10 cm in greatest transverse dimension. It extends up the lateral thigh approximately 8 cm and up the medial thigh approximately 5.5 cm (as measured on coronal series 3 images 57 and 49, respectively). Ligaments Suboptimally assessed by CT. Muscles and Tendons There is heterogeneous partially rim enhancing low-density within the medial aspect of the distal right sartorius muscle that again extends to the medial skin surface (axial series 2, images 52 through 66). The lowest density, rim enhancing portion measures up to 1.5 x 1.2 x 6.0 cm (transverse by AP by craniocaudal, axial series 2, image 54 and coronal series 3, image 54), decreased from prior. This again may extend and drained to the distal medial thigh surgical incision site (axial series 2 images 57 through 64). There is fluid tracking up the lateral right thigh in between the lateral subcutaneous fat in the vastus lateralis muscle, overall measuring up to approximately 3 x 9 x 19 cm (transverse by AP by craniocaudal, axial series 2, image 47 and coronal series 3, image 63). There is similar fluid tracking along the lateral left thigh in between the deep aspect of the subcutaneous fat in the vastus lateralis muscle, measuring up to approximately 2 cm in transverse dimension and 18 cm in craniocaudal dimension. This is seen on the coronal images but is off the lateral  plane of view of the axial series 2. There is interval decrease in now minimal complex fluid anteromedial to the sartorius and vastus medialis muscles that minimally extends through the distal anteromedial thigh skin surface (axial series 2, images 44 through 66). Soft tissues There is diffuse moderate subcutaneous fat edema and swelling. Fluid distends the rectum, and there is moderate rectal wall thickening as seen on today's CT abdomen and pelvis. IMPRESSION: Compared to 11/01/2022: 1. Interval postsurgical changes of revised, now bilateral above-the-knee amputation. 2. There are bilateral low-density collections just distal to the amputation stumps extending up the distal lateral greater than medial thigh. These collections contain moderate scattered air and are suspicious for abscesses. These collections appear to extend to the prior surgical incision at the distal stump, possibly draining to the skin surface. 3. There is complex fluid tracking between the lateral thigh subcutaneous fat and the vastus lateralis muscles bilaterally, as above. 4. Interval decrease in size of small complex fluid collections superficial to the anteromedial aspect of the right sartorius muscle and the junction of the left sartorius and vastus medialis muscles, likely minimally draining to the anteromedial thigh skin surface. Electronically Signed   By: Neita Garnet M.D.   On: 11/26/2022 14:56   DG Chest Portable 1 View  Result Date: 11/26/2022 CLINICAL DATA:  Tachycardia, vomiting, diaphoretic EXAM: PORTABLE CHEST 1 VIEW COMPARISON:  10/08/2022 FINDINGS: Diffuse symmetric and bilateral interstitial opacities favored to be interstitial edema/early CHF. Mild cardiac enlargement. No large effusion or pneumothorax. Trachea midline. Monitor leads overlie the chest. IMPRESSION: Cardiomegaly with diffuse interstitial edema pattern. Electronically Signed   By: Judie Petit.  Shick M.D.   On: 11/26/2022 14:54   CT ABDOMEN PELVIS W  CONTRAST  Result Date: 11/26/2022 CLINICAL DATA:  Vomiting and generalized abdominal pain 1 week after bilateral above knee amputation. EXAM: CT ABDOMEN AND PELVIS WITH CONTRAST TECHNIQUE: Multidetector CT imaging of the abdomen and pelvis was performed using the standard protocol following bolus administration of intravenous contrast. RADIATION DOSE REDUCTION: This exam was performed according to the departmental dose-optimization program which includes automated exposure control, adjustment of the mA and/or kV according to patient size and/or use of iterative reconstruction technique. CONTRAST:  OMNIPAQUE IOHEXOL 300 MG/ML  SOLN COMPARISON:  April 05, 2022. FINDINGS: Lower chest: Minimal bibasilar subsegmental atelectasis or edema is noted. Hepatobiliary: No focal liver abnormality is seen. No gallstones, gallbladder wall thickening, or biliary dilatation. Pancreas: Unremarkable. No pancreatic ductal dilatation or surrounding inflammatory changes. Spleen: Normal in size without focal abnormality. Adrenals/Urinary Tract: Adrenal glands are unremarkable. Kidneys are normal, without renal calculi, focal lesion, or hydronephrosis. Bladder is unremarkable. Stomach/Bowel: The stomach is unremarkable. The appendix appears normal. There is no evidence of bowel obstruction. Moderate wall thickening of the colon and rectum is noted most consistent with infectious or inflammatory colitis. Vascular/Lymphatic: No significant vascular findings are present. No enlarged abdominal or pelvic lymph nodes. Reproductive: Prostate is unremarkable. Other: No ascites or hernia is noted.  Mild anasarca is noted. Musculoskeletal: No acute or significant osseous findings. IMPRESSION: Moderate diffuse colonic wall thickening is noted suggesting infectious or inflammatory colitis. Minimal bibasilar subsegmental atelectasis or edema is noted. Electronically Signed   By: Lupita Raider M.D.   On: 11/26/2022 14:07    Pending  Labs Unresulted Labs (From admission, onward)     Start     Ordered   11/28/22 0500  Creatinine, serum  Every Mon-Wed-Fri (0500),   R      11/26/22 1645   11/28/22 0500  CBC  Daily,   R      11/27/22 0744   11/28/22 0500  Comprehensive metabolic panel  Daily,   R      11/27/22 0744   11/28/22 0000  Culture, blood (Routine X 2) w Reflex to ID Panel  BLOOD CULTURE X 2,   R (with TIMED occurrences)      11/27/22 1117   11/26/22 0758  Urinalysis, Routine w reflex microscopic -Urine, Clean Catch  Once,   URGENT       Question:  Specimen Source  Answer:  Urine, Clean Catch   11/26/22 0758            Vitals/Pain Today's Vitals   11/27/22 1106 11/27/22 1112 11/27/22 1220 11/27/22 1410  BP: (!) 146/95     Pulse: (!) 123     Resp: 18     Temp: 98.2 F (36.8 C)     TempSrc: Oral     SpO2: 100%     Weight:      PainSc:  8  7  8      Isolation Precautions Contact and Enteric precautions (UV disinfection)  Medications Medications  vancomycin (VANCOCIN) capsule 500 mg (500 mg Oral Given 11/27/22 1408)  HYDROmorphone (DILAUDID) injection 1 mg (  1 mg Intravenous Given 11/27/22 1357)  insulin detemir (LEVEMIR) injection 4 Units (4 Units Subcutaneous Given 11/27/22 1110)  sucralfate (CARAFATE) tablet 1 g (1 g Oral Given 11/27/22 1247)  gabapentin (NEURONTIN) capsule 300 mg (300 mg Oral Given 11/27/22 1357)  ascorbic acid (VITAMIN C) tablet 1,000 mg (1,000 mg Oral Given 11/27/22 1100)  zinc sulfate capsule 220 mg (220 mg Oral Given 11/27/22 1101)  enoxaparin (LOVENOX) injection 40 mg (40 mg Subcutaneous Given 11/26/22 2341)  acetaminophen (TYLENOL) tablet 650 mg (650 mg Oral Given 11/27/22 1357)    Or  acetaminophen (TYLENOL) suppository 650 mg ( Rectal See Alternative 11/27/22 1357)  meropenem (MERREM) 1 g in sodium chloride 0.9 % 100 mL IVPB (1 g Intravenous New Bag/Given 11/27/22 1406)  insulin aspart (novoLOG) injection 0-9 Units (1 Units Subcutaneous Given 11/27/22 1245)  insulin  aspart (novoLOG) injection 0-5 Units (3 Units Subcutaneous Given 11/26/22 2342)  lactated ringers infusion (0 mLs Intravenous Stopped 11/27/22 0415)  ketorolac (TORADOL) 15 MG/ML injection 15 mg (15 mg Intravenous Given 11/26/22 2025)  metoCLOPramide (REGLAN) injection 5 mg (5 mg Intravenous Given 11/27/22 1242)  ondansetron (ZOFRAN) injection 4 mg (4 mg Intravenous Given 11/27/22 1357)  hydrALAZINE (APRESOLINE) injection 10 mg (10 mg Intravenous Given 11/27/22 0347)  fentaNYL (DURAGESIC) 50 MCG/HR 1 patch (has no administration in time range)  lactated ringers infusion ( Intravenous Restarted 11/27/22 1321)  acidophilus (RISAQUAD) capsule 2 capsule (2 capsules Oral Given 11/27/22 1111)  DAPTOmycin (CUBICIN) IVPB 700 mg/154mL premix (0 mg Intravenous Stopped 11/27/22 1320)  droperidol (INAPSINE) 2.5 MG/ML injection 2.5 mg (2.5 mg Intravenous Given 11/26/22 0837)  sodium chloride 0.9 % bolus 1,000 mL (0 mLs Intravenous Stopped 11/26/22 0942)  metoCLOPramide (REGLAN) injection 10 mg (10 mg Intravenous Given 11/26/22 1028)  sodium chloride 0.9 % bolus 1,000 mL (0 mLs Intravenous Stopped 11/26/22 1241)  iohexol (OMNIPAQUE) 300 MG/ML solution 100 mL (100 mLs Intravenous Contrast Given 11/26/22 1156)  HYDROmorphone (DILAUDID) injection 1 mg (1 mg Intravenous Given 11/26/22 1603)  ceFEPIme (MAXIPIME) 2 g in sodium chloride 0.9 % 100 mL IVPB (0 g Intravenous Stopped 11/26/22 1719)  potassium chloride SA (KLOR-CON M) CR tablet 40 mEq (40 mEq Oral Given 11/27/22 0847)    Mobility non-ambulatory       R Recommendations: See Admitting Provider Note  Report given to:

## 2022-11-27 NOTE — Hospital Course (Addendum)
Kelli Hudson  is a 32 y.o. 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  Was discharged to SNF. -Patient was sent from the facility secondary to intractable nausea, vomiting and complaints of abdominal pain, she is with known history of diabetes and gastroparesis, as well she does report history of diarrhea, patient had no complaints yesterday, all symptoms started this morning, she denies fever, chills, or chest pain related to cough.  -ED CT abdomen pelvis significant for diffuse colitis, her C. difficile PCR came back positive, as well C. difficile antigen positive, but toxin was negative, she has significant leukocytosis at 18.9, known anemia with hemoglobin of 8.5, alk phos elevated at 492, but it  is trending down from 773 last month.  CT lower extremities was significant for fluid collections bilaterally concerning for abscess, [facility] hospitalist consulted to admit.    Assessment & Plan:     Principal Problem:   Sepsis (HCC) Active Problems:   Essential hypertension   Drug-seeking behavior   Uncontrolled type 1 diabetes mellitus with hyperglycemia, with long-term current use of insulin (HCC)   Hypertension associated with diabetes (HCC)   Diabetic polyneuropathy associated with type 1 diabetes mellitus (HCC)   Diabetic nephropathy associated with type 1 diabetes mellitus (HCC)   Diabetic retinopathy of both eyes associated with type 1 diabetes mellitus (HCC)   GERD without esophagitis   Diabetic gastroparesis (HCC)   Sepsis present on admission -Still meeting sepsis criteria, with tachycardia, leukocytosis, -Likely secondary to C. difficile colitis versus a stump infection/?abscess recent AKA amputation. -Still tachycardic, blood pressure elevated,  worsening  leukocytosis 17.9-to >>> 21.0  lactic acid 1.0  Abdominal pain, nausea, vomiting and diarrhea C. difficile colitis -Multifactorial, diabetic gastroparesis, most recently last month, broad-spectrum antibiotic coverage -CT abdomen pelvis significant for colitis,  - stool for C. difficile is positive for toxigenic C. difficile PCR, positive for C. difficile antigen, but negative for C. difficile toxin, but given her clinical presentation, and CT findings, was started on p.o. 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 Difficile  -Appreciate Ortho and ID evaluation and recommendations     Nausea and vomiting known history of gastroparesis  -Continue with as needed Zofran, she is on scheduled Reglan at home, will continue on IV for now    Diabetes mellitus, type I, -Even though her oral intake is unreliable,  -Continue on low-dose and titrating long-acting insulin Semglee, and low-dose insulin sliding scale to prevent DKA -Will check CBGs  -Continue with IV fluids   Elevated alk phos -it is actually trending down, no acute finding bile duct on CT abdomen pelvis     Diabetic neuropathy -Continue with gabapentin   Hypertension -Not on any BP meds at  home, monitoring History of hypertension -Continue as needed  hydralazine   GERD -Will hold PPI due to C. difficile infection    Hypokalemia -Monitoring and repleting

## 2022-11-27 NOTE — Progress Notes (Signed)
PHARMACY - PHYSICIAN COMMUNICATION CRITICAL VALUE ALERT - BLOOD CULTURE IDENTIFICATION (BCID)  Kelli Hudson is an 32 y.o. female who presented to [facility] on 11/26/2022 with a chief complaint of Emesis  Assessment:  patient with diabetic gastroparesis with recent admit for bilateral necrotizing fasciitis and required extensive debridement and subsequent amputations for necrotizing fascitiis.   Now pt have intractable nausea , bomiting and complaints of abdominal pain. Pt with diffuse colitis on CT abdomen Cdiff PCR positive and on vancomycin. BCID BCx 1/4 bottles +GPC. ID physician was contacted(Dr. Daiva Eves).   Name of physician (or Provider) Contacted: Dr. Flossie Dibble  Current antibiotics: Merrem and Vancomycin PO  Changes to prescribed antibiotics recommended:  Plan to add Daptomycin 700mg  IV every 24 hrs  Results for orders placed or performed during the hospital encounter of 11/26/22  Blood Culture ID Panel (Reflexed) (Collected: 11/26/2022 10:59 AM)  Result Value Ref Range   Enterococcus faecalis NOT DETECTED NOT DETECTED   Enterococcus Faecium NOT DETECTED NOT DETECTED   Listeria monocytogenes NOT DETECTED NOT DETECTED   Staphylococcus species DETECTED (A) NOT DETECTED   Staphylococcus aureus (BCID) NOT DETECTED NOT DETECTED   Staphylococcus epidermidis NOT DETECTED NOT DETECTED   Staphylococcus lugdunensis NOT DETECTED NOT DETECTED   Streptococcus species NOT DETECTED NOT DETECTED   Streptococcus agalactiae NOT DETECTED NOT DETECTED   Streptococcus pneumoniae NOT DETECTED NOT DETECTED   Streptococcus pyogenes NOT DETECTED NOT DETECTED   A.calcoaceticus-baumannii NOT DETECTED NOT DETECTED   Bacteroides fragilis NOT DETECTED NOT DETECTED   Enterobacterales NOT DETECTED NOT DETECTED   Enterobacter cloacae complex NOT DETECTED NOT DETECTED   Escherichia coli NOT DETECTED NOT DETECTED   Klebsiella aerogenes NOT DETECTED NOT DETECTED   Klebsiella oxytoca NOT DETECTED NOT  DETECTED   Klebsiella pneumoniae NOT DETECTED NOT DETECTED   Proteus species NOT DETECTED NOT DETECTED   Salmonella species NOT DETECTED NOT DETECTED   Serratia marcescens NOT DETECTED NOT DETECTED   Haemophilus influenzae NOT DETECTED NOT DETECTED   Neisseria meningitidis NOT DETECTED NOT DETECTED   Pseudomonas aeruginosa NOT DETECTED NOT DETECTED   Stenotrophomonas maltophilia NOT DETECTED NOT DETECTED   Candida albicans NOT DETECTED NOT DETECTED   Candida auris NOT DETECTED NOT DETECTED   Candida glabrata NOT DETECTED NOT DETECTED   Candida krusei NOT DETECTED NOT DETECTED   Candida parapsilosis NOT DETECTED NOT DETECTED   Candida tropicalis NOT DETECTED NOT DETECTED   Cryptococcus neoformans/gattii NOT DETECTED NOT DETECTED    Elder Cyphers L 11/27/2022  11:10 AM

## 2022-11-27 NOTE — Progress Notes (Addendum)
PROGRESS NOTE    Patient: Kelli Hudson                            PCP: Norm Salt, [place]                    DOB: [birth year]            DOA: 11/26/2022 UEA:[id number]             DOS: 11/27/2022, 11:03 AM   LOS: 1 day   Date of Service: The patient was seen and examined on 11/27/2022  Subjective:   The patient was seen and examined this morning.  Awake alert in no acute distress Hypertensive, tachycardic Consistently asking the nursing staff for pain medications Reporting of improved nausea and no vomiting this morning Reports of multiple loose bowels yesterday none this morning  Brief Narrative:   Eleina Gong  is a 32 y.o. 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  Was discharged to SNF. -Patient was sent from the facility secondary to intractable nausea, vomiting and complaints of abdominal pain, she is with known history of diabetes and gastroparesis, as well she does report history of diarrhea, patient had no complaints yesterday, all symptoms started this morning, she denies fever, chills, or chest pain related to cough.  -ED CT abdomen pelvis significant for diffuse colitis, her C. difficile PCR came back positive, as well C. difficile antigen positive, but toxin was negative, she has significant leukocytosis at 18.9, known anemia with hemoglobin of 8.5, alk phos elevated at 492, but it  is trending down from 773 last month.  CT lower extremities was significant for fluid collections bilaterally concerning for abscess, [facility] hospitalist consulted to admit.    Assessment & Plan:     Principal Problem:   Sepsis (HCC) Active Problems:   Essential hypertension   Drug-seeking behavior   Uncontrolled type 1 diabetes mellitus with hyperglycemia, with  long-term current use of insulin (HCC)   Hypertension associated with diabetes (HCC)   Diabetic polyneuropathy associated with type 1 diabetes mellitus (HCC)   Diabetic nephropathy associated with type 1 diabetes mellitus (HCC)   Diabetic retinopathy of both eyes associated with type 1 diabetes mellitus (HCC)   GERD without esophagitis   Diabetic gastroparesis (HCC)   Sepsis present on admission -Still meeting sepsis criteria, with tachycardia, leukocytosis, -Likely secondary to C. difficile colitis versus a stump infection/?abscess recent AKA amputation. -Still tachycardic, blood pressure elevated,  worsening leukocytosis 17.9-to >>> 21.0  lactic acid 1.0  Abdominal pain, nausea, vomiting and diarrhea C. difficile colitis -Multifactorial, diabetic gastroparesis, most recently last month, broad-spectrum antibiotic coverage -CT abdomen pelvis significant for colitis,  - stool for C. difficile is positive for toxigenic C. difficile PCR, positive for C. difficile antigen, but negative for C. difficile toxin, but given her clinical presentation, and CT findings, was started on p.o. vancomycin,  Also IV Flagyl for now given her oral intake is unreliable due to nausea and vomiting -Will start on probiotics  -ID was consulted-appreciate Dr. 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 Lajoyce Corners,  -Dr. Lajoyce Corners has been notified will see the patient at [facility] -Will continue her empirically on Meropenem given her history of ESBL infection, but should be discontinued if that fluid analysis felt not infection by orthopedic given her C. Difficile  -Appreciate Ortho and ID evaluation and recommendations     Nausea and  vomiting known history of gastroparesis  -Continue with as needed Zofran, she is on scheduled Reglan at home, will continue on IV for now    Diabetes mellitus, type I, -Even though her oral intake is unreliable,  -Continue on low-dose and titrating long-acting insulin Semglee, and low-dose insulin sliding scale to prevent DKA -Will check CBGs  -Continue with IV fluids   Elevated alk phos -it is actually trending down, no acute finding bile duct on CT abdomen pelvis     Diabetic neuropathy -Continue with gabapentin   Hypertension -Not on any BP meds at home, monitoring History of hypertension -Continue as needed hydralazine   GERD -Will hold PPI due to C. difficile infection    Hypokalemia -Monitoring and repleting      ----------------------------------------------------------------------------------------------------------------------------------------------- Nutritional status:  The patient's BMI is: Body mass index is 25.9 kg/m. I agree with the assessment and plan as outlined below: Nutrition Status:           Skin Assessment: I have examined the patient's skin and I agree with the wound assessment as performed by wound care team As outlined -------------------------------------------------------------------------------------------------------------------------------------- Cultures; Blood Cultures x 2 >> -----------------------------------------------------------------------------------------------------------------------------------------  DVT prophylaxis:  enoxaparin (LOVENOX) injection 40 mg Start: 11/26/22 2200   Code Status:   Code Status: Full Code  Family Communication: No family member present at bedside- attempt will be made to update daily  -Advance care planning has been discussed.   Admission status:   Status is: Inpatient Remains inpatient appropriate because: Needing IV antibiotics, evaluation by Ortho surgery and infectious disease  team   Disposition: From  - home             Planning for discharge in 1-2 days: to   Procedures:   No admission procedures for hospital encounter.   Antimicrobials:  Anti-infectives (From admission, onward)    Start     Dose/Rate Route Frequency Ordered Stop   01/02/23 1000  vancomycin (VANCOCIN) capsule 125 mg  Status:  Discontinued       Placed in "Followed by" Linked Group   125 mg Oral Every 3 DAYS 11/26/22 1759 11/26/22 1759   12/25/22 1000  vancomycin (VANCOCIN) capsule 125 mg  Status:  Discontinued       Placed in "Followed by" Linked Group   125 mg Oral Every other day 11/26/22 1759 11/26/22 1759   12/18/22 1000  vancomycin (VANCOCIN) capsule 125 mg  Status:  Discontinued       Placed in "Followed by" Linked Group   125 mg Oral Daily 11/26/22 1759 11/26/22 1759   12/10/22 2200  vancomycin (VANCOCIN) capsule 125 mg  Status:  Discontinued       Placed in "Followed by" Linked Group   125 mg Oral 2 times daily 11/26/22 1759 11/26/22 1759   11/27/22 0600  vancomycin (VANCOREADY) IVPB 1250 mg/250 mL  Status:  Discontinued        1,250 mg 166.7 mL/hr over 90 Minutes Intravenous Every 12 hours 11/26/22 1637 11/26/22 1851   11/26/22 2000  meropenem (MERREM) 1 g in sodium chloride 0.9 % 100 mL IVPB  1 g 200 mL/hr over 30 Minutes Intravenous Every 8 hours 11/26/22 1856     11/26/22 1800  vancomycin (VANCOCIN) capsule 500 mg        500 mg Oral 4 times daily 11/26/22 1759     11/26/22 1800  vancomycin (VANCOCIN) capsule 125 mg  Status:  Discontinued       Placed in "Followed by" Linked Group   125 mg Oral 4 times daily 11/26/22 1759 11/26/22 1759   11/26/22 1615  vancomycin (VANCOREADY) IVPB 1750 mg/350 mL  Status:  Discontinued        1,750 mg 175 mL/hr over 120 Minutes Intravenous  Once 11/26/22 1557 11/26/22 2019   11/26/22 1600  ceFEPIme (MAXIPIME) 2 g in sodium chloride 0.9 % 100 mL IVPB        2 g 200 mL/hr over 30 Minutes Intravenous  Once 11/26/22 1550 11/26/22  1719   11/26/22 1230  metroNIDAZOLE (FLAGYL) IVPB 500 mg  Status:  Discontinued        500 mg 100 mL/hr over 60 Minutes Intravenous Every 12 hours 11/26/22 1227 11/26/22 1856        Medication:   acidophilus  2 capsule Oral TID   ascorbic acid  1,000 mg Oral Daily   enoxaparin (LOVENOX) injection  40 mg Subcutaneous Q24H   [START ON 11/29/2022] fentaNYL  1 patch Transdermal Q72H   gabapentin  300 mg Oral Q8H   insulin aspart  0-5 Units Subcutaneous QHS   insulin aspart  0-9 Units Subcutaneous TID WC   insulin detemir  4 Units Subcutaneous BID   metoCLOPramide (REGLAN) injection  5 mg Intravenous Q6H   sucralfate  1 g Oral TID WC & HS   vancomycin  500 mg Oral QID   zinc sulfate  220 mg Oral Daily    acetaminophen **OR** acetaminophen, hydrALAZINE, HYDROmorphone (DILAUDID) injection, ketorolac, ondansetron (ZOFRAN) IV   Objective:   Vitals:   11/27/22 0600 11/27/22 0630 11/27/22 0700 11/27/22 0730  BP: (!) 164/107 (!) 174/120 (!) 170/114 (!) 168/111  Pulse: (!) 124 (!) 129 (!) 137 (!) 129  Resp: 20     Temp:    98.4 F (36.9 C)  TempSrc:    Oral  SpO2: 99% 99% 99% 100%  Weight:        Intake/Output Summary (Last 24 hours) at 11/27/2022 1103 Last data filed at 11/27/2022 0700 Gross per 24 hour  Intake 200 ml  Output --  Net 200 ml   Filed Weights   11/26/22 2035  Weight: 75 kg     Physical examination:   Constitution:  Alert, cooperative, no distress,  Appears calm and comfortable  Psychiatric:   Normal and stable mood and affect, cognition intact,   HEENT:        Normocephalic, PERRL, otherwise with in Normal limits  Chest:         Chest symmetric Cardio vascular:  S1/S2, RRR, No murmure, No Rubs or Gallops  pulmonary: Clear to auscultation bilaterally, respirations unlabored, negative wheezes / crackles Abdomen: Soft, non-tender, non-distended, bowel sounds,no masses, no organomegaly Muscular skeletal: BL AKA - Limited exam - in bed, able to move all 4  extremities,   Neuro: CNII-XII intact. , normal motor and sensation, reflexes intact  Extremities: No pitting edema lower extremities, +2 pulses  Skin: Dry, warm to touch, negative for any Rashes,  Wounds: per nursing documentation  Bilateral AKA, with staples present, please see pictures below        ------------------------------------------------------------------------------------------------------------------------------------------  LABs:     Latest Ref Rng & Units 11/27/2022    5:25 AM 11/26/2022    8:13 AM 11/22/2022    4:20 AM  CBC  WBC 4.0 - 10.5 K/uL 21.0  17.9  15.3   Hemoglobin 12.0 - 15.0 g/dL 9.9  8.5  8.2   Hematocrit 36.0 - 46.0 % 30.5  25.8  24.9   Platelets 150 - 400 K/uL 882  742  588       Latest Ref Rng & Units 11/27/2022    5:25 AM 11/26/2022    8:13 AM 11/22/2022    4:20 AM  CMP  Glucose 70 - 99 mg/dL 485  462  703   BUN 6 - 20 mg/dL 24  26  36   Creatinine 0.44 - 1.00 mg/dL 5.00  9.38  1.82   Sodium 135 - 145 mmol/L 138  133  137   Potassium 3.5 - 5.1 mmol/L 3.2  4.3  4.4   Chloride 98 - 111 mmol/L 110  107  109   CO2 22 - 32 mmol/L 18  21  21    Calcium 8.9 - 10.3 mg/dL 8.7  8.1  7.8   Total Protein 6.5 - 8.1 g/dL 7.7  7.1    Total Bilirubin 0.3 - 1.2 mg/dL 0.9  0.5    Alkaline Phos 38 - 126 U/L 462  492    AST 15 - 41 U/L 21  25    ALT 0 - 44 U/L 27  31         Micro Results Recent Results (from the past 240 hour(s))  Culture, blood (routine x 2)     Status: None (Preliminary result)   Collection Time: 11/26/22 10:59 AM   Specimen: BLOOD LEFT HAND  Result Value Ref Range Status   Specimen Description   Final    BLOOD LEFT HAND Performed at [facility], [address] [place], [place] [address]    Special Requests   Final    BOTTLES DRAWN AEROBIC ONLY Blood Culture adequate volume Performed at [facility], [address] [place], [place] [address]    Culture  Setup Time   Final    GRAM POSITIVE COCCI IN CLUSTERS AEROBIC  BOTTLE ONLY CRITICAL RESULT CALLED TO, READ BACK BY AND VERIFIED WITH: Forest Gleason [phone], VIRAY,J Organism ID to follow Performed at [facility], [address] [place], [place] [address]    Culture GRAM POSITIVE COCCI IN CLUSTERS  Final   Report Status PENDING  Incomplete  Culture, blood (routine x 2)     Status: None (Preliminary result)   Collection Time: 11/26/22 10:59 AM   Specimen: BLOOD  Result Value Ref Range Status   Specimen Description BLOOD LT HAND  Final   Special Requests   Final    BOTTLES DRAWN AEROBIC AND ANAEROBIC Blood Culture adequate volume   Culture   Final    NO GROWTH < 24 HOURS Performed at [facility], [address] [place], [place] [address]    Report Status PENDING  Incomplete  Blood Culture ID Panel (Reflexed)     Status: Abnormal   Collection Time: 11/26/22 10:59 AM  Result Value Ref Range Status   Enterococcus faecalis NOT DETECTED NOT DETECTED Final   Enterococcus Faecium NOT DETECTED NOT DETECTED Final   Listeria monocytogenes NOT DETECTED NOT DETECTED Final   Staphylococcus species DETECTED (A) NOT DETECTED Final    Comment: CRITICAL RESULT CALLED TO, READ  BACK BY AND VERIFIED WITH: L. POOLE PHARMD, AT 1057 11/27/22 D. VANHOOK    Staphylococcus aureus (BCID) NOT DETECTED NOT DETECTED Final   Staphylococcus epidermidis NOT DETECTED NOT DETECTED Final   Staphylococcus lugdunensis NOT DETECTED NOT DETECTED Final   Streptococcus species NOT DETECTED NOT DETECTED Final   Streptococcus agalactiae NOT DETECTED NOT DETECTED Final   Streptococcus pneumoniae NOT DETECTED NOT DETECTED Final   Streptococcus pyogenes NOT DETECTED NOT DETECTED Final   A.calcoaceticus-baumannii NOT DETECTED NOT DETECTED Final   Bacteroides fragilis NOT DETECTED NOT DETECTED Final   Enterobacterales NOT DETECTED NOT DETECTED Final   Enterobacter cloacae complex NOT DETECTED NOT DETECTED Final   Escherichia coli NOT DETECTED NOT DETECTED Final   Klebsiella  aerogenes NOT DETECTED NOT DETECTED Final   Klebsiella oxytoca NOT DETECTED NOT DETECTED Final   Klebsiella pneumoniae NOT DETECTED NOT DETECTED Final   Proteus species NOT DETECTED NOT DETECTED Final   Salmonella species NOT DETECTED NOT DETECTED Final   Serratia marcescens NOT DETECTED NOT DETECTED Final   Haemophilus influenzae NOT DETECTED NOT DETECTED Final   Neisseria meningitidis NOT DETECTED NOT DETECTED Final   Pseudomonas aeruginosa NOT DETECTED NOT DETECTED Final   Stenotrophomonas maltophilia NOT DETECTED NOT DETECTED Final   Candida albicans NOT DETECTED NOT DETECTED Final   Candida auris NOT DETECTED NOT DETECTED Final   Candida glabrata NOT DETECTED NOT DETECTED Final   Candida krusei NOT DETECTED NOT DETECTED Final   Candida parapsilosis NOT DETECTED NOT DETECTED Final   Candida tropicalis NOT DETECTED NOT DETECTED Final   Cryptococcus neoformans/gattii NOT DETECTED NOT DETECTED Final    Comment: Performed at [facility], [address], [place], [place] [address]  C Difficile Quick Screen w PCR reflex     Status: Abnormal   Collection Time: 11/26/22 11:01 AM   Specimen: STOOL  Result Value Ref Range Status   C Diff antigen POSITIVE (A) NEGATIVE Final   C Diff toxin NEGATIVE NEGATIVE Final   C Diff interpretation Results are indeterminate. See PCR results.  Final    Comment: Performed at [facility], [address], [place], [place] [address]  Gastrointestinal Panel by PCR , Stool     Status: None   Collection Time: 11/26/22 11:01 AM   Specimen: STOOL  Result Value Ref Range Status   Campylobacter species NOT DETECTED NOT DETECTED Final   Plesimonas shigelloides NOT DETECTED NOT DETECTED Final   Salmonella species NOT DETECTED NOT DETECTED Final   Yersinia enterocolitica NOT DETECTED NOT DETECTED Final   Vibrio species NOT DETECTED NOT DETECTED Final   Vibrio cholerae NOT DETECTED NOT DETECTED Final   Enteroaggregative E coli (EAEC) NOT DETECTED NOT DETECTED  Final   Enteropathogenic E coli (EPEC) NOT DETECTED NOT DETECTED Final   Enterotoxigenic E coli (ETEC) NOT DETECTED NOT DETECTED Final   Shiga like toxin producing E coli (STEC) NOT DETECTED NOT DETECTED Final   Shigella/Enteroinvasive E coli (EIEC) NOT DETECTED NOT DETECTED Final   Cryptosporidium NOT DETECTED NOT DETECTED Final   Cyclospora cayetanensis NOT DETECTED NOT DETECTED Final   Entamoeba histolytica NOT DETECTED NOT DETECTED Final   Giardia lamblia NOT DETECTED NOT DETECTED Final   Adenovirus F40/41 NOT DETECTED NOT DETECTED Final   Astrovirus NOT DETECTED NOT DETECTED Final   Norovirus GI/GII NOT DETECTED NOT DETECTED Final   Rotavirus A NOT DETECTED NOT DETECTED Final   Sapovirus (I, II, IV, and V) NOT DETECTED NOT DETECTED Final    Comment: Performed  at [facility], [address] [place], [place] [address]  C. Diff by PCR, Reflexed     Status: Abnormal   Collection Time: 11/26/22 11:01 AM  Result Value Ref Range Status   Toxigenic C. Difficile by PCR POSITIVE (A) NEGATIVE Final    Comment: Positive for toxigenic C. difficile with little to no toxin production. Only treat if clinical presentation suggests symptomatic illness. Performed at [facility], [address] [place], [place] [address]     Radiology Reports CT EXTREM LOWER W CM BIL  Result Date: 11/26/2022 CLINICAL DATA:  Soft tissue infection suspected, lower leg. X-ray done. Status post bilateral above the knee amputation 1 week ago. Patient has been vomiting all morning. EXAM: CT OF THE LOWER BILATERAL EXTREMITY WITH CONTRAST TECHNIQUE: Multidetector CT imaging of the lower bilateral extremity was performed according to the standard protocol following intravenous contrast administration. RADIATION DOSE REDUCTION: This exam was performed according to the departmental dose-optimization program which includes automated exposure control, adjustment of the mA and/or kV according to patient size and/or  use of iterative reconstruction technique. COMPARISON:  CT right tibia and fibula and right femur 11/01/2022, CT left tibia and fibula and left femur 11/01/2022 CONTRAST:  OMNIPAQUE IOHEXOL 300 MG/ML  SOLN FINDINGS: Bones/Joint/Cartilage Mild bilateral superolateral acetabular degenerative osteophytosis. Minimal superior pubic symphysis joint space narrowing. Interval postsurgical changes of bilateral above-the-knee amputation. Starting at the superior aspect of the femoral head, the right femur measures up to approximately 30.8 cm in the left femur measures up to approximately 30.3 cm. The distal femoral diaphyseal amputation margins are sharp. There are surgical skin staples at the distal aspect of each soft tissue distal amputation stump. Right lower extremity: There is a low-density collection just distal to the amputation stump and extending up the distal lateral greater than medial thigh (axial series 2 images 58 through 73 and axial series 5 images 1 through 17). This collection is of moderate density and contains moderate scattered air. There is mild peripheral rim enhancement. This is suspicious for an abscess, and appears to extend to the prior surgical incision at the distal stump, possibly draining to the skin surface (coronal series 7, images 43-45). The irregular size of this collection makes measurements difficult, however it appears to measure up to approximately 11 cm in transverse dimension. Extends up the lateral thigh approximately 8.5 cm (as measured on coronal series 3, image 62) and the medial thigh up to approximately 4.6 cm. No cortical erosion to indicate acute osteomyelitis. Left lower extremity: Simple are to the contralateral side, there is a low-density collection just distal to the amputation stump extending up the distal lateral greater than medial thigh (axial series 2 images 47 through 68 and axial series 5 images 1 through 12). This collection is similar moderate density and  contains more mild scattered air compared to the contralateral leg. There is mild peripheral rim enhancement. A thin tract is seen from this fluid draining to the distal anterior amputation stump surgical incision at the skin surface (coronal series 3 images 42 through 45, axial series 5 images 13 through 15). This irregular collection measures up to approximately 10 cm in greatest transverse dimension. It extends up the lateral thigh approximately 8 cm and up the medial thigh approximately 5.5 cm (as measured on coronal series 3 images 57 and 49, respectively). Ligaments Suboptimally assessed by CT. Muscles and Tendons There is heterogeneous partially rim enhancing low-density within the medial aspect of the distal right  sartorius muscle that again extends to the medial skin surface (axial series 2, images 52 through 66). The lowest density, rim enhancing portion measures up to 1.5 x 1.2 x 6.0 cm (transverse by AP by craniocaudal, axial series 2, image 54 and coronal series 3, image 54), decreased from prior. This again may extend and drained to the distal medial thigh surgical incision site (axial series 2 images 57 through 64). There is fluid tracking up the lateral right thigh in between the lateral subcutaneous fat in the vastus lateralis muscle, overall measuring up to approximately 3 x 9 x 19 cm (transverse by AP by craniocaudal, axial series 2, image 47 and coronal series 3, image 63). There is similar fluid tracking along the lateral left thigh in between the deep aspect of the subcutaneous fat in the vastus lateralis muscle, measuring up to approximately 2 cm in transverse dimension and 18 cm in craniocaudal dimension. This is seen on the coronal images but is off the lateral plane of view of the axial series 2. There is interval decrease in now minimal complex fluid anteromedial to the sartorius and vastus medialis muscles that minimally extends through the distal anteromedial thigh skin surface (axial  series 2, images 44 through 66). Soft tissues There is diffuse moderate subcutaneous fat edema and swelling. Fluid distends the rectum, and there is moderate rectal wall thickening as seen on today's CT abdomen and pelvis. IMPRESSION: Compared to 11/01/2022: 1. Interval postsurgical changes of revised, now bilateral above-the-knee amputation. 2. There are bilateral low-density collections just distal to the amputation stumps extending up the distal lateral greater than medial thigh. These collections contain moderate scattered air and are suspicious for abscesses. These collections appear to extend to the prior surgical incision at the distal stump, possibly draining to the skin surface. 3. There is complex fluid tracking between the lateral thigh subcutaneous fat and the vastus lateralis muscles bilaterally, as above. 4. Interval decrease in size of small complex fluid collections superficial to the anteromedial aspect of the right sartorius muscle and the junction of the left sartorius and vastus medialis muscles, likely minimally draining to the anteromedial thigh skin surface. Electronically Signed   By: Neita Garnet M.D.   On: 11/26/2022 14:56   DG Chest Portable 1 View  Result Date: 11/26/2022 CLINICAL DATA:  Tachycardia, vomiting, diaphoretic EXAM: PORTABLE CHEST 1 VIEW COMPARISON:  10/08/2022 FINDINGS: Diffuse symmetric and bilateral interstitial opacities favored to be interstitial edema/early CHF. Mild cardiac enlargement. No large effusion or pneumothorax. Trachea midline. Monitor leads overlie the chest. IMPRESSION: Cardiomegaly with diffuse interstitial edema pattern. Electronically Signed   By: Judie Petit.  Shick M.D.   On: 11/26/2022 14:54   CT ABDOMEN PELVIS W CONTRAST  Result Date: 11/26/2022 CLINICAL DATA:  Vomiting and generalized abdominal pain 1 week after bilateral above knee amputation. EXAM: CT ABDOMEN AND PELVIS WITH CONTRAST TECHNIQUE: Multidetector CT imaging of the abdomen and pelvis was  performed using the standard protocol following bolus administration of intravenous contrast. RADIATION DOSE REDUCTION: This exam was performed according to the departmental dose-optimization program which includes automated exposure control, adjustment of the mA and/or kV according to patient size and/or use of iterative reconstruction technique. CONTRAST:  OMNIPAQUE IOHEXOL 300 MG/ML  SOLN COMPARISON:  April 05, 2022. FINDINGS: Lower chest: Minimal bibasilar subsegmental atelectasis or edema is noted. Hepatobiliary: No focal liver abnormality is seen. No gallstones, gallbladder wall thickening, or biliary dilatation. Pancreas: Unremarkable. No pancreatic ductal dilatation or surrounding inflammatory changes. Spleen: Normal in size  without focal abnormality. Adrenals/Urinary Tract: Adrenal glands are unremarkable. Kidneys are normal, without renal calculi, focal lesion, or hydronephrosis. Bladder is unremarkable. Stomach/Bowel: The stomach is unremarkable. The appendix appears normal. There is no evidence of bowel obstruction. Moderate wall thickening of the colon and rectum is noted most consistent with infectious or inflammatory colitis. Vascular/Lymphatic: No significant vascular findings are present. No enlarged abdominal or pelvic lymph nodes. Reproductive: Prostate is unremarkable. Other: No ascites or hernia is noted.  Mild anasarca is noted. Musculoskeletal: No acute or significant osseous findings. IMPRESSION: Moderate diffuse colonic wall thickening is noted suggesting infectious or inflammatory colitis. Minimal bibasilar subsegmental atelectasis or edema is noted. Electronically Signed   By: Lupita Raider M.D.   On: 11/26/2022 14:07    SIGNED: Kendell Bane, MD, FHM. FAAFP. [facility] - [facility] Time spent - 55 min.  In seeing, evaluating and examining the patient. Reviewing medical records, labs, drawn plan of care. [facility],  Pager (please use [email or web address] to page/  text) Please use Epic Secure Chat for non-urgent communication (7AM-7PM)  If 7PM-7AM, please contact night-coverage [email or web address], 11/27/2022, 11:03 AM

## 2022-11-27 NOTE — ED Notes (Signed)
Patient refused to put cardiac monitor back on, stating "I'm not hooked up and I don't need to be"

## 2022-11-28 ENCOUNTER — Inpatient Hospital Stay (HOSPITAL_COMMUNITY): Payer: Medicaid Other

## 2022-11-28 ENCOUNTER — Other Ambulatory Visit (HOSPITAL_COMMUNITY): Payer: Self-pay

## 2022-11-28 DIAGNOSIS — N182 Chronic kidney disease, stage 2 (mild): Secondary | ICD-10-CM | POA: Diagnosis not present

## 2022-11-28 DIAGNOSIS — E103593 Type 1 diabetes mellitus with proliferative diabetic retinopathy without macular edema, bilateral: Secondary | ICD-10-CM | POA: Diagnosis not present

## 2022-11-28 DIAGNOSIS — R112 Nausea with vomiting, unspecified: Secondary | ICD-10-CM | POA: Diagnosis not present

## 2022-11-28 DIAGNOSIS — E1022 Type 1 diabetes mellitus with diabetic chronic kidney disease: Secondary | ICD-10-CM

## 2022-11-28 DIAGNOSIS — Z89611 Acquired absence of right leg above knee: Secondary | ICD-10-CM

## 2022-11-28 DIAGNOSIS — E876 Hypokalemia: Secondary | ICD-10-CM | POA: Diagnosis not present

## 2022-11-28 DIAGNOSIS — L02419 Cutaneous abscess of limb, unspecified: Secondary | ICD-10-CM | POA: Diagnosis not present

## 2022-11-28 DIAGNOSIS — A0472 Enterocolitis due to Clostridium difficile, not specified as recurrent: Principal | ICD-10-CM

## 2022-11-28 LAB — COMPREHENSIVE METABOLIC PANEL
ALT: 22 U/L (ref 0–44)
AST: 25 U/L (ref 15–41)
Albumin: <1.5 g/dL — ABNORMAL LOW (ref 3.5–5.0)
Alkaline Phosphatase: 419 U/L — ABNORMAL HIGH (ref 38–126)
Anion gap: 7 (ref 5–15)
BUN: 16 mg/dL (ref 6–20)
CO2: 21 mmol/L — ABNORMAL LOW (ref 22–32)
Calcium: 8.5 mg/dL — ABNORMAL LOW (ref 8.9–10.3)
Chloride: 108 mmol/L (ref 98–111)
Creatinine, Ser: 0.92 mg/dL (ref 0.44–1.00)
GFR, Estimated: >60 mL/min (ref >60)
Glucose, Bld: 70 mg/dL (ref 70–99)
Potassium: 3.1 mmol/L — ABNORMAL LOW (ref 3.5–5.1)
Sodium: 136 mmol/L (ref 135–145)
Total Bilirubin: 0.4 mg/dL (ref 0.3–1.2)
Total Protein: 6.6 g/dL (ref 6.5–8.1)

## 2022-11-28 LAB — GLUCOSE, CAPILLARY
Glucose-Capillary: 93 mg/dL (ref 70–99)
Glucose-Capillary: 127 mg/dL — ABNORMAL HIGH (ref 70–99)
Glucose-Capillary: 72 mg/dL (ref 70–99)
Glucose-Capillary: 101 mg/dL — ABNORMAL HIGH (ref 70–99)

## 2022-11-28 LAB — CBC
HCT: 22.7 % — ABNORMAL LOW (ref 36.0–46.0)
Hemoglobin: 7.5 g/dL — ABNORMAL LOW (ref 12.0–15.0)
MCH: 30.2 pg (ref 26.0–34.0)
MCHC: 33 g/dL (ref 30.0–36.0)
MCV: 91.5 fL (ref 80.0–100.0)
Platelets: 844 10*3/uL — ABNORMAL HIGH (ref 150–400)
RBC: 2.48 MIL/uL — ABNORMAL LOW (ref 3.87–5.11)
RDW: 13.8 % (ref 11.5–15.5)
WBC: 15 10*3/uL — ABNORMAL HIGH (ref 4.0–10.5)
nRBC: 0 % (ref 0.0–0.2)

## 2022-11-28 LAB — BRAIN NATRIURETIC PEPTIDE: B Natriuretic Peptide: 1773.1 pg/mL — ABNORMAL HIGH (ref 0.0–100.0)

## 2022-11-28 LAB — CULTURE, BLOOD (ROUTINE X 2): Special Requests: ADEQUATE

## 2022-11-28 LAB — PROCALCITONIN: Procalcitonin: <0.1 ng/mL

## 2022-11-28 LAB — MAGNESIUM: Magnesium: 1.6 mg/dL — ABNORMAL LOW (ref 1.7–2.4)

## 2022-11-28 LAB — C-REACTIVE PROTEIN: CRP: 1.3 mg/dL — ABNORMAL HIGH (ref <1.0)

## 2022-11-28 LAB — PROTIME-INR
INR: 1.1 (ref 0.8–1.2)
Prothrombin Time: 14.8 s (ref 11.4–15.2)

## 2022-11-28 MED ORDER — METOCLOPRAMIDE HCL 5 MG/ML IJ SOLN
10.0000 mg | Freq: Three times a day (TID) | INTRAMUSCULAR | Status: DC
  Administered 2022-11-28 – 2022-12-03 (×16): 10 mg via INTRAVENOUS
  Filled 2022-11-28 (×16): qty 2

## 2022-11-28 MED ORDER — VANCOMYCIN HCL 125 MG PO CAPS
125.0000 mg | ORAL_CAPSULE | Freq: Four times a day (QID) | ORAL | Status: DC
  Administered 2022-11-28 – 2022-12-03 (×21): 125 mg via ORAL
  Filled 2022-11-28 (×21): qty 1

## 2022-11-28 MED ORDER — POTASSIUM CHLORIDE CRYS ER 20 MEQ PO TBCR
40.0000 meq | EXTENDED_RELEASE_TABLET | Freq: Once | ORAL | Status: AC
  Administered 2022-11-28: 40 meq via ORAL
  Filled 2022-11-28: qty 2

## 2022-11-28 MED ORDER — ORAL CARE MOUTH RINSE: 15.0000 mL | OROMUCOSAL | Status: DC | PRN

## 2022-11-28 NOTE — TOC Benefit Eligibility Note (Signed)
Patient Product/process development scientist completed.    The patient is insured through [facility].     Ran test claim for vancomycin 125 mg and the current 10 day co-pay is $4.00.   This test claim was processed through [facility]- copay amounts may vary at other pharmacies due to pharmacy/plan contracts, or as the patient moves through the different stages of their insurance plan.     Roland Earl, CPHT Pharmacy Technician III Certified Patient Advocate [facility] Patient Advocate Team Direct Number: [phone]  Fax: [phone]

## 2022-11-28 NOTE — Consult Note (Signed)
Date of Admission:  11/26/2022          Reason for Consult:  ? 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 Lajoyce Corners Nausea, vomiting Diabetic gastroparesis   Plan:  Stop systemic antibiotics and observe off of them Supportive of oral vancomycin for C. difficile colitis given the inconclusive C. difficile testing and CT scan findings though she does have the confounding issue of having gastroparesis and has not had diarrhea in the last 24 hours  I will sign off for now  Please call with further questions.  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 difficile diarrhea   Scheduled Meds:  ascorbic acid  1,000 mg Oral Daily   enoxaparin (LOVENOX) injection  40 mg Subcutaneous Q24H   [START ON 11/29/2022] fentaNYL  1 patch Transdermal Q72H   gabapentin  300 mg Oral Q8H   insulin aspart  0-5 Units Subcutaneous QHS   insulin aspart  0-9 Units Subcutaneous TID WC   insulin detemir  4 Units Subcutaneous BID   metoCLOPramide (REGLAN) injection  10 mg Intravenous Q8H   sucralfate  1 g Oral TID WC & HS   vancomycin  125 mg Oral QID   zinc sulfate  220 mg Oral Daily   Continuous Infusions: PRN Meds:.acetaminophen **OR** acetaminophen, hydrALAZINE,  HYDROmorphone (DILAUDID) injection, ketorolac, ondansetron (ZOFRAN) IV, mouth rinse  HPI: Yolunda Kloos is a 32 y.o. 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   Because we felt that good surgical control been obtained of the infection we stopped antibiotics which were causing problems including hyperkalemia (bactrim)  To be doing well but then went to [facility] due to intractable nausea and vomiting as well as abdominal pain and diarrhea.  Done at [facility] included CT of bilateral lower extremities which showed postsurgical changes of the bilateral above-the-knee amputations with low-density collections distal to the amputation stump that extend to the distal lateral greater than medial thigh which have moderate scattered air and made radiology concerned for infection at these sites. There was also  There is complex fluid tracking between the lateral thigh subcutaneous fat and the vastus lateralis muscles bilaterally, as above. Interval decrease in size of small complex fluid collections superficial to the anteromedial aspect of the right sartorius muscle and the junction of the left sartorius and vastus medialis muscles, likely minimally draining to the anteromedial thigh skin surface.  CT pelvis showed diffuse colonic wall thickening concerning for infectious or inflammatory colitis.  Stool when collected was positive for C. difficile antigen but negative for C. difficile toxin and  positive for toxigenic C difficile by PCR.  The admitting hospitalist placed her on treatment for C. difficile colitis given the CT findings and inconclusive  C diff testing  Early she was placed on broad-spectrum antibiotics including daptomycin meropenem.  Dr.  Lajoyce Corners evaluated the patient that her exam had improved he believes the fluid collections are due to lymphatic fluid.  He had recommended her being on antibiotics to prevent infection of these sites.  I think given her C. difficile at minimum colonization I am not terribly eager to have her on antibiotics unless we have proven there is an infection in these sites.  I am supportive of continuing to treat her for potential C. difficile colitis with vancomycin orally.  I have personally spent 82 minutes involved in face-to-face and non-face-to-face activities for this patient on the day of the visit. Professional time spent includes the following activities: Preparing to see the patient (review of tests), Obtaining and/or reviewing separately obtained history (admission/discharge record), Performing a medically appropriate examination and/or evaluation , Ordering medications/tests/procedures, referring and communicating with other health care professionals, Documenting clinical information in the EMR, Independently interpreting results (not separately reported), Communicating results to the patient/family/caregiver, Counseling and educating the patient/family/caregiver and Care coordination (not separately reported).   I will sign off for now  Please call with further questions.   Review of Systems: Review of Systems  Constitutional:  Negative for chills, fever, malaise/fatigue and weight loss.  HENT:  Negative for congestion and sore throat.   Eyes:  Negative for blurred vision and photophobia.  Respiratory:  Negative for cough, shortness of breath and wheezing.   Cardiovascular:  Negative for chest pain, palpitations and leg swelling.  Gastrointestinal:  Positive for abdominal pain, diarrhea and vomiting. Negative for blood in stool, constipation, heartburn, melena and nausea.  Genitourinary:  Negative for dysuria, flank pain and hematuria.  Musculoskeletal:  Negative for back pain, falls, joint  pain and myalgias.  Skin:  Negative for itching and rash.  Neurological:  Negative for dizziness, focal weakness, loss of consciousness, weakness and headaches.  Endo/Heme/Allergies:  Does not bruise/bleed easily.  Psychiatric/Behavioral:  Negative for depression and suicidal ideas. The patient does not have insomnia.     Past Medical History:  Diagnosis Date   Acute H. pylori gastric ulcer    Coffee ground emesis    Diabetes mellitus (HCC)    Diabetic gastroparesis (HCC)    DKA (diabetic ketoacidosis) (HCC) 02/24/2021   Gastroparesis    GERD (gastroesophageal reflux disease)    Hypertension    Hyperthyroidism    Intractable nausea and vomiting 04/20/2021   Normocytic anemia 04/20/2020   Prolonged Q-T interval on ECG     Social History   Tobacco Use   Smoking status: Never   Smokeless tobacco: Never  Vaping Use   Vaping status: Never Used  Substance Use Topics   Alcohol use: Yes    Alcohol/week: 1.0 standard drink of alcohol    Types: 1 Shots of liquor per week    Comment: occasional    Drug use: No    Family History  Problem Relation Age of Onset   Lung cancer Mother    Diabetes Mother    Bipolar disorder Father    Liver cancer Maternal Grandfather    Diabetes Maternal Aunt        x 2   Pancreatic cancer Maternal Uncle    Prostate cancer Maternal Uncle    Breast cancer Other  maternal great aunt   Heart disease Other    Ovarian cancer Other        maternal great aunt   Kidney disease Other        maternal great aunt   Colon cancer Neg Hx    Stomach cancer Neg Hx    Allergies  Allergen Reactions   Ibuprofen Other (See Comments)    07/23/22 - Worsening kidney function after 3 doses of ibuprofen 400mg .  Creatinine increased from 1.3 to 2.  Creatinine returned to normal after stopping.   Lisinopril Cough, Other (See Comments), Itching and Rash    Breakout in rash    OBJECTIVE: Blood pressure (!) 172/107, pulse (!) 122, temperature 99 F (37.2 C),  temperature source Oral, resp. rate 13, weight 75 kg, SpO2 98%.  Physical Exam Constitutional:      General: She is not in acute distress.    Appearance: Normal appearance. She is well-developed. She is not ill-appearing or diaphoretic.  HENT:     Head: Normocephalic and atraumatic.     Right Ear: Hearing and external ear normal.     Left Ear: Hearing and external ear normal.     Nose: No nasal deformity or rhinorrhea.  Eyes:     General: No scleral icterus.    Conjunctiva/sclera: Conjunctivae normal.     Right eye: Right conjunctiva is not injected.     Left eye: Left conjunctiva is not injected.     Pupils: Pupils are equal, round, and reactive to light.  Neck:     Vascular: No JVD.  Cardiovascular:     Rate and Rhythm: Normal rate and regular rhythm.     Heart sounds: S1 normal and S2 normal.  Pulmonary:     Effort: Pulmonary effort is normal. No respiratory distress.     Breath sounds: No wheezing.  Abdominal:     Palpations: Abdomen is soft.  Musculoskeletal:        General: Normal range of motion.     Right shoulder: Normal.     Left shoulder: Normal.     Cervical back: Normal range of motion and neck supple.     Right hip: Normal.     Left hip: Normal.     Right knee: Normal.     Left knee: Normal.  Lymphadenopathy:     Head:     Right side of head: No submandibular, preauricular or posterior auricular adenopathy.     Left side of head: No submandibular, preauricular or posterior auricular adenopathy.     Cervical: No cervical adenopathy.     Right cervical: No superficial or deep cervical adenopathy.    Left cervical: No superficial or deep cervical adenopathy.  Skin:    General: Skin is warm and dry.     Coloration: Skin is not pale.     Findings: No abrasion, bruising, ecchymosis, erythema, lesion or rash.     Nails: There is no clubbing.  Neurological:     Mental Status: She is alert and oriented to person, place, and time.     Sensory: No sensory deficit.      Coordination: Coordination normal.     Gait: Gait normal.  Psychiatric:        Attention and Perception: Attention normal. She is attentive.        Mood and Affect: Mood is anxious. Affect is tearful.        Speech: Speech normal.        Behavior: Behavior normal.  Behavior is cooperative.        Thought Content: Thought content normal.        Cognition and Memory: Cognition and memory normal.        Judgment: Judgment normal.     Lab Results Lab Results  Component Value Date   WBC 15.0 (H) 11/28/2022   HGB 7.5 (L) 11/28/2022   HCT 22.7 (L) 11/28/2022   MCV 91.5 11/28/2022   PLT 844 (H) 11/28/2022    Lab Results  Component Value Date   CREATININE 0.92 11/28/2022   BUN 16 11/28/2022   NA 136 11/28/2022   K 3.1 (L) 11/28/2022   CL 108 11/28/2022   CO2 21 (L) 11/28/2022    Lab Results  Component Value Date   ALT 22 11/28/2022   AST 25 11/28/2022   ALKPHOS 419 (H) 11/28/2022   BILITOT 0.4 11/28/2022     Microbiology: Recent Results (from the past 240 hour(s))  Culture, blood (routine x 2)     Status: Abnormal   Collection Time: 11/26/22 10:59 AM   Specimen: BLOOD LEFT HAND  Result Value Ref Range Status   Specimen Description   Final    BLOOD LEFT HAND Performed at [facility], [address] [place], [place] [address]    Special Requests   Final    BOTTLES DRAWN AEROBIC ONLY Blood Culture adequate volume Performed at [facility], [address] [place], [place] [address]    Culture  Setup Time   Final    GRAM POSITIVE COCCI IN CLUSTERS AEROBIC BOTTLE ONLY CRITICAL RESULT CALLED TO, READ BACK BY AND VERIFIED WITH: COURTNEY EANES [phone], VIRAY,J Organism ID to follow    Culture (A)  Final    STAPHYLOCOCCUS HOMINIS THE SIGNIFICANCE OF ISOLATING THIS ORGANISM FROM A SINGLE SET OF BLOOD CULTURES WHEN MULTIPLE SETS ARE DRAWN IS UNCERTAIN. PLEASE NOTIFY THE MICROBIOLOGY DEPARTMENT WITHIN ONE WEEK IF SPECIATION AND SENSITIVITIES ARE  REQUIRED. Performed at [facility], [address] [place], [place] [address]    Report Status 11/28/2022 FINAL  Final  Culture, blood (routine x 2)     Status: None (Preliminary result)   Collection Time: 11/26/22 10:59 AM   Specimen: BLOOD  Result Value Ref Range Status   Specimen Description BLOOD LT HAND  Final   Special Requests   Final    BOTTLES DRAWN AEROBIC AND ANAEROBIC Blood Culture adequate volume   Culture   Final    NO GROWTH 2 DAYS Performed at [facility], [address] [place], [place] [address]    Report Status PENDING  Incomplete  Blood Culture ID Panel (Reflexed)     Status: Abnormal   Collection Time: 11/26/22 10:59 AM  Result Value Ref Range Status   Enterococcus faecalis NOT DETECTED NOT DETECTED Final   Enterococcus Faecium NOT DETECTED NOT DETECTED Final   Listeria monocytogenes NOT DETECTED NOT DETECTED Final   Staphylococcus species DETECTED (A) NOT DETECTED Final    Comment: CRITICAL RESULT CALLED TO, READ BACK BY AND VERIFIED WITH: L. POOLE PHARMD, AT 1057 11/27/22 D. VANHOOK    Staphylococcus aureus (BCID) NOT DETECTED NOT DETECTED Final   Staphylococcus epidermidis NOT DETECTED NOT DETECTED Final   Staphylococcus lugdunensis NOT DETECTED NOT DETECTED Final   Streptococcus species NOT DETECTED NOT DETECTED Final   Streptococcus agalactiae NOT DETECTED NOT DETECTED Final   Streptococcus pneumoniae NOT DETECTED NOT DETECTED Final   Streptococcus pyogenes NOT DETECTED NOT DETECTED Final  A.calcoaceticus-baumannii NOT DETECTED NOT DETECTED Final   Bacteroides fragilis NOT DETECTED NOT DETECTED Final   Enterobacterales NOT DETECTED NOT DETECTED Final   Enterobacter cloacae complex NOT DETECTED NOT DETECTED Final   Escherichia coli NOT DETECTED NOT DETECTED Final   Klebsiella aerogenes NOT DETECTED NOT DETECTED Final   Klebsiella oxytoca NOT DETECTED NOT DETECTED Final   Klebsiella pneumoniae NOT DETECTED NOT DETECTED Final   Proteus species  NOT DETECTED NOT DETECTED Final   Salmonella species NOT DETECTED NOT DETECTED Final   Serratia marcescens NOT DETECTED NOT DETECTED Final   Haemophilus influenzae NOT DETECTED NOT DETECTED Final   Neisseria meningitidis NOT DETECTED NOT DETECTED Final   Pseudomonas aeruginosa NOT DETECTED NOT DETECTED Final   Stenotrophomonas maltophilia NOT DETECTED NOT DETECTED Final   Candida albicans NOT DETECTED NOT DETECTED Final   Candida auris NOT DETECTED NOT DETECTED Final   Candida glabrata NOT DETECTED NOT DETECTED Final   Candida krusei NOT DETECTED NOT DETECTED Final   Candida parapsilosis NOT DETECTED NOT DETECTED Final   Candida tropicalis NOT DETECTED NOT DETECTED Final   Cryptococcus neoformans/gattii NOT DETECTED NOT DETECTED Final    Comment: Performed at [facility], [address], [place], [place] [address]  C Difficile Quick Screen w PCR reflex     Status: Abnormal   Collection Time: 11/26/22 11:01 AM   Specimen: STOOL  Result Value Ref Range Status   C Diff antigen POSITIVE (A) NEGATIVE Final   C Diff toxin NEGATIVE NEGATIVE Final   C Diff interpretation Results are indeterminate. See PCR results.  Final    Comment: Performed at [facility], [address], [place], [place] [address]  Gastrointestinal Panel by PCR , Stool     Status: None   Collection Time: 11/26/22 11:01 AM   Specimen: STOOL  Result Value Ref Range Status   Campylobacter species NOT DETECTED NOT DETECTED Final   Plesimonas shigelloides NOT DETECTED NOT DETECTED Final   Salmonella species NOT DETECTED NOT DETECTED Final   Yersinia enterocolitica NOT DETECTED NOT DETECTED Final   Vibrio species NOT DETECTED NOT DETECTED Final   Vibrio cholerae NOT DETECTED NOT DETECTED Final   Enteroaggregative E coli (EAEC) NOT DETECTED NOT DETECTED Final   Enteropathogenic E coli (EPEC) NOT DETECTED NOT DETECTED Final   Enterotoxigenic E coli (ETEC) NOT DETECTED NOT DETECTED Final   Shiga like toxin producing E  coli (STEC) NOT DETECTED NOT DETECTED Final   Shigella/Enteroinvasive E coli (EIEC) NOT DETECTED NOT DETECTED Final   Cryptosporidium NOT DETECTED NOT DETECTED Final   Cyclospora cayetanensis NOT DETECTED NOT DETECTED Final   Entamoeba histolytica NOT DETECTED NOT DETECTED Final   Giardia lamblia NOT DETECTED NOT DETECTED Final   Adenovirus F40/41 NOT DETECTED NOT DETECTED Final   Astrovirus NOT DETECTED NOT DETECTED Final   Norovirus GI/GII NOT DETECTED NOT DETECTED Final   Rotavirus A NOT DETECTED NOT DETECTED Final   Sapovirus (I, II, IV, and V) NOT DETECTED NOT DETECTED Final    Comment: Performed at [facility], [address] [place], [place] [address]  C. Diff by PCR, Reflexed     Status: Abnormal   Collection Time: 11/26/22 11:01 AM  Result Value Ref Range Status   Toxigenic C. Difficile by PCR POSITIVE (A) NEGATIVE Final    Comment: Positive for toxigenic C. difficile with little to no toxin production. Only treat if clinical presentation suggests symptomatic illness. Performed at [facility], [address]  [place], [place] [address]     Acey Lav, MD [facility] [phone] pager  11/28/2022, 4:15 PM

## 2022-11-28 NOTE — Consult Note (Signed)
ORTHOPAEDIC CONSULTATION  REQUESTING PHYSICIAN: Leroy Sea, MD  Chief Complaint: Drainage bilateral above-knee amputations.  HPI: Kelli Hudson is a 32 y.o. female who presents with bilateral above-knee amputations on September 27.  Patient was discharged to skilled nursing and patient has developed drainage and swelling in both legs.  Past Medical History:  Diagnosis Date   Acute H. pylori gastric ulcer    Coffee ground emesis    Diabetes mellitus (HCC)    Diabetic gastroparesis (HCC)    DKA (diabetic ketoacidosis) (HCC) 02/24/2021   Gastroparesis    GERD (gastroesophageal reflux disease)    Hypertension    Hyperthyroidism    Intractable nausea and vomiting 04/20/2021   Normocytic anemia 04/20/2020   Prolonged Q-T interval on ECG    Past Surgical History:  Procedure Laterality Date   AMPUTATION Bilateral 10/24/2022   Procedure: BILATERAL BELOW KNEE AMPUTATION;  Surgeon: Nadara Mustard, MD;  Location: [facility] OR;  Service: Orthopedics;  Laterality: Bilateral;   AMPUTATION Bilateral 10/08/2022   Procedure: BILATERAL LEG DEBRIDEMENT;  Surgeon: Nadara Mustard, MD;  Location[facility] OR;  Service: Orthopedics;  Laterality: Bilateral;   AMPUTATION Bilateral 11/14/2022   Procedure: BILATERAL ABOVE KNEE AMPUTATION;  Surgeon: Nadara Mustard, MD;  Location: [facility] OR;  Service: Orthopedics;  Laterality: Bilateral;   AMPUTATION TOE Left 03/10/2018   Procedure: AMPUTATION FIFTH TOE;  Surgeon: Vivi Barrack, DPM;  Location: [facility];  Service: Podiatry;  Laterality: Left;   APPLICATION OF WOUND VAC Bilateral 10/12/2022   Procedure: APPLICATION OF WOUND VAC BILATERAL LEGS;  Surgeon: Myrene Galas, MD;  Location: [facility] OR;  Service: Orthopedics;  Laterality: Bilateral;   APPLICATION OF WOUND VAC Bilateral 10/24/2022   Procedure: APPLICATION OF WOUND VAC;  Surgeon: Nadara Mustard, MD;  Location: [facility] OR;  Service: Orthopedics;  Laterality: Bilateral;   BIOPSY  01/28/2020    Procedure: BIOPSY;  Surgeon: Kathi Der, MD;  Location: [facility];  Service: Gastroenterology;;   BOTOX INJECTION  08/26/2021   Procedure: BOTOX INJECTION;  Surgeon: Sherrilyn Rist, MD;  Location: [facility];  Service: Gastroenterology;;   ESOPHAGOGASTRODUODENOSCOPY N/A 01/28/2020   Procedure: ESOPHAGOGASTRODUODENOSCOPY (EGD);  Surgeon: Kathi Der, MD;  Location: [facility];  Service: Gastroenterology;  Laterality: N/A;   ESOPHAGOGASTRODUODENOSCOPY (EGD) WITH PROPOFOL Left 09/08/2015   Procedure: ESOPHAGOGASTRODUODENOSCOPY (EGD) WITH PROPOFOL;  Surgeon: Willis Modena, MD;  Location: [facility] ENDOSCOPY;  Service: Endoscopy;  Laterality: Left;   ESOPHAGOGASTRODUODENOSCOPY (EGD) WITH PROPOFOL N/A 08/26/2021   Procedure: ESOPHAGOGASTRODUODENOSCOPY (EGD) WITH PROPOFOL;  Surgeon: Sherrilyn Rist, MD;  Location: [facility];  Service: Gastroenterology;  Laterality: N/A;   GASTRIC STIMULATOR IMPLANT SURGERY  06/2022   at [facility]   I & D EXTREMITY Bilateral 10/12/2022   Procedure: IRRIGATION AND  DEBRIDEMENT  BILATERAL LEGS;  Surgeon: Myrene Galas, MD;  Location: [facility] OR;  Service: Orthopedics;  Laterality: Bilateral;   I & D EXTREMITY Right 10/13/2022   Procedure: RIGHT THIGH WOUND VAC EXCHANGE;  Surgeon: Roby Lofts, MD;  Location: [facility] OR;  Service: Orthopedics;  Laterality: Right;   I & D EXTREMITY Bilateral 10/17/2022   Procedure: BILATERAL THIGH AND LEG DEBRIDEMENT, PARTIAL BILATERAL FOOT AMPUTATION;  Surgeon: Nadara Mustard, MD;  Location: [facility] OR;  Service: Orthopedics;  Laterality: Bilateral;   I & D EXTREMITY Bilateral 11/05/2022   Procedure: BILATERAL THIGH DEBRIDEMENT;  Surgeon: Nadara Mustard, MD;  Location: [facility] OR;  Service: Orthopedics;  Laterality: Bilateral;   I & D EXTREMITY  Bilateral 11/07/2022   Procedure: BILATERAL THIGH AND LEG DEBRIDEMENT;  Surgeon: Nadara Mustard, MD;  Location[facility] OR;  Service: Orthopedics;  Laterality: Bilateral;   I & D EXTREMITY Bilateral  11/12/2022   Procedure: BILATERAL LEG DEBRIDEMENTS;  Surgeon: Nadara Mustard, MD;  Location: [facility] OR;  Service: Orthopedics;  Laterality: Bilateral;   IRRIGATION AND DEBRIDEMENT FOOT Bilateral 10/05/2022   Procedure: IRRIGATION AND DEBRIDEMENT FOOT;  Surgeon: [name], DPM;  Location: [facility];  Service: Orthopedics/Podiatry;  Laterality: Bilateral;   WISDOM TOOTH EXTRACTION     Social History   Socioeconomic History   Marital status: Single    Spouse name: Not on file   Number of children: 0   Years of education: Not on file   Highest education level: Not on file  Occupational History   Occupation: Financial risk analyst  Tobacco Use   Smoking status: Never   Smokeless tobacco: Never  Vaping Use   Vaping status: Never Used  Substance and Sexual Activity   Alcohol use: Yes    Alcohol/week: 1.0 standard drink of alcohol    Types: 1 Shots of liquor per week    Comment: occasional    Drug use: No   Sexual activity: Yes    Birth control/protection: Condom, Implant    Comment: Nexplanon  Other Topics Concern   Not on file  Social History Narrative   Not on file   Social Determinants of Health   Financial Resource Strain: Low Risk  (02/13/2022)   Received from [facility], [facility]   Overall Financial Resource Strain (CARDIA)    Difficulty of Paying Living Expenses: Not hard at all  Food Insecurity: No Food Insecurity (10/03/2022)   Hunger Vital Sign    Worried About Running Out of Food in the Last Year: Never true    Ran Out of Food in the Last Year: Never true  Transportation Needs: No Transportation Needs (10/03/2022)   PRAPARE - Administrator, Civil Service (Medical): No    Lack of Transportation (Non-Medical): No  Physical Activity: Not on file  Stress: No Stress Concern Present (09/16/2022)   Received from [facility] of Occupational Health - Occupational Stress Questionnaire    Feeling of Stress : Not at all  Social Connections:  Unknown (06/17/2021)   Received from [facility], [facility]   Social Network    Social Network: Not on file   Family History  Problem Relation Age of Onset   Lung cancer Mother    Diabetes Mother    Bipolar disorder Father    Liver cancer Maternal Grandfather    Diabetes Maternal Aunt        x 2   Pancreatic cancer Maternal Uncle    Prostate cancer Maternal Uncle    Breast cancer Other        maternal great aunt   Heart disease Other    Ovarian cancer Other        maternal great aunt   Kidney disease Other        maternal great aunt   Colon cancer Neg Hx    Stomach cancer Neg Hx    - negative except otherwise stated in the family history section Allergies  Allergen Reactions   Ibuprofen Other (See Comments)    07/23/22 - Worsening kidney function after 3 doses of ibuprofen 400mg .  Creatinine increased from 1.3 to 2.  Creatinine returned to normal after stopping.   Lisinopril Cough,  Other (See Comments), Itching and Rash    Breakout in rash   Prior to Admission medications   Medication Sig Start Date End Date Taking? Authorizing Provider  acetaminophen (TYLENOL) 325 MG tablet Take 2 tablets (650 mg total) by mouth every 6 (six) hours as needed for mild pain, fever or headache. 11/22/22  Yes Azucena Fallen, MD  ascorbic acid (VITAMIN C) 1000 MG tablet Take 1 tablet (1,000 mg total) by mouth daily. 11/22/22  Yes Azucena Fallen, MD  DULoxetine (CYMBALTA) 60 MG capsule Take 1 capsule (60 mg total) by mouth daily. 11/22/22  Yes Azucena Fallen, MD  etonogestrel (NEXPLANON) 68 MG IMPL implant 1 each by Subdermal route continuous.   Yes [provider]  feeding supplement, GLUCERNA SHAKE, (GLUCERNA SHAKE) LIQD Take 237 mLs by mouth 3 (three) times daily between meals. 11/22/22  Yes Azucena Fallen, MD  fentaNYL (DURAGESIC) 50 MCG/HR Place 1 patch onto the skin every 3 (three) days. 11/23/22  Yes Azucena Fallen, MD  gabapentin (NEURONTIN) 300 MG  capsule Take 1 capsule (300 mg total) by mouth every 8 (eight) hours. 11/22/22  Yes Azucena Fallen, MD  HYDROmorphone (DILAUDID) 2 MG tablet Take 1-2 tablets (2-4 mg total) by mouth every 3 (three) hours as needed for severe pain or moderate pain. 11/22/22  Yes Azucena Fallen, MD  insulin detemir (LEVEMIR FLEXTOUCH) 100 UNIT/ML FlexPen Inject 4 Units into the skin 2 (two) times daily. 11/22/22  Yes Azucena Fallen, MD  MAGNESIUM PO Take 1 tablet by mouth daily.   Yes [provider]  methocarbamol (ROBAXIN) 500 MG tablet Take 500 mg by mouth 3 (three) times daily. 11/22/22  Yes [provider]  metoCLOPramide (REGLAN) 10 MG tablet Take 1 tablet (10 mg total) by mouth every 8 (eight) hours. 11/22/22  Yes Azucena Fallen, MD  Multiple Vitamin (MULTIVITAMIN WITH MINERALS) TABS tablet Take 1 tablet by mouth daily. 11/22/22  Yes Azucena Fallen, MD  nutrition supplement, JUVEN, (JUVEN) PACK Take 1 packet by mouth 2 (two) times daily between meals. 11/22/22  Yes Azucena Fallen, MD  omeprazole (PRILOSEC) 40 MG capsule Take 1 capsule (40 mg total) by mouth daily. 11/22/22  Yes Azucena Fallen, MD  OVER THE COUNTER MEDICATION Take 1 tablet by mouth daily. Nervive   Yes [provider]  sucralfate (CARAFATE) 1 g tablet Take 1 g by mouth in the morning, at noon, and at bedtime. 08/28/22  Yes [provider]  traZODone (DESYREL) 50 MG tablet Take 1 tablet (50 mg total) by mouth at bedtime as needed for sleep. 11/22/22  Yes Azucena Fallen, MD  zinc sulfate 220 (50 Zn) MG capsule Take 1 capsule (220 mg total) by mouth daily for 6 days. 11/22/22 11/28/22 Yes Azucena Fallen, MD  Continuous Glucose Sensor (DEXCOM G6 SENSOR) MISC 1 Device by Does not apply route as directed. 06/18/22   Shamleffer, Konrad Dolores, MD  Continuous Glucose Transmitter (DEXCOM G6 TRANSMITTER) MISC 1 Device by Does not apply route as directed. 06/18/22   Shamleffer,  Konrad Dolores, MD  Insulin Pen Needle 32G X 4 MM MISC 1 Device by Does not apply route in the morning, at noon, in the evening, and at bedtime. 06/18/22   Shamleffer, Konrad Dolores, MD  insulin aspart (NOVOLOG) 100 UNIT/ML FlexPen Inject 5 Units into the skin 3 (three) times daily with meals. 02/23/18 07/05/19  Jerald Kief, MD   CT EXTREM LOWER W CM  BIL  Result Date: 11/26/2022 CLINICAL DATA:  Soft tissue infection suspected, lower leg. X-ray done. Status post bilateral above the knee amputation 1 week ago. Patient has been vomiting all morning. EXAM: CT OF THE LOWER BILATERAL EXTREMITY WITH CONTRAST TECHNIQUE: Multidetector CT imaging of the lower bilateral extremity was performed according to the standard protocol following intravenous contrast administration. RADIATION DOSE REDUCTION: This exam was performed according to the departmental dose-optimization program which includes automated exposure control, adjustment of the mA and/or kV according to patient size and/or use of iterative reconstruction technique. COMPARISON:  CT right tibia and fibula and right femur 11/01/2022, CT left tibia and fibula and left femur 11/01/2022 CONTRAST:  OMNIPAQUE IOHEXOL 300 MG/ML  SOLN FINDINGS: Bones/Joint/Cartilage Mild bilateral superolateral acetabular degenerative osteophytosis. Minimal superior pubic symphysis joint space narrowing. Interval postsurgical changes of bilateral above-the-knee amputation. Starting at the superior aspect of the femoral head, the right femur measures up to approximately 30.8 cm in the left femur measures up to approximately 30.3 cm. The distal femoral diaphyseal amputation margins are sharp. There are surgical skin staples at the distal aspect of each soft tissue distal amputation stump. Right lower extremity: There is a low-density collection just distal to the amputation stump and extending up the distal lateral greater than medial thigh (axial series 2 images 58 through 73 and  axial series 5 images 1 through 17). This collection is of moderate density and contains moderate scattered air. There is mild peripheral rim enhancement. This is suspicious for an abscess, and appears to extend to the prior surgical incision at the distal stump, possibly draining to the skin surface (coronal series 7, images 43-45). The irregular size of this collection makes measurements difficult, however it appears to measure up to approximately 11 cm in transverse dimension. Extends up the lateral thigh approximately 8.5 cm (as measured on coronal series 3, image 62) and the medial thigh up to approximately 4.6 cm. No cortical erosion to indicate acute osteomyelitis. Left lower extremity: Simple are to the contralateral side, there is a low-density collection just distal to the amputation stump extending up the distal lateral greater than medial thigh (axial series 2 images 47 through 68 and axial series 5 images 1 through 12). This collection is similar moderate density and contains more mild scattered air compared to the contralateral leg. There is mild peripheral rim enhancement. A thin tract is seen from this fluid draining to the distal anterior amputation stump surgical incision at the skin surface (coronal series 3 images 42 through 45, axial series 5 images 13 through 15). This irregular collection measures up to approximately 10 cm in greatest transverse dimension. It extends up the lateral thigh approximately 8 cm and up the medial thigh approximately 5.5 cm (as measured on coronal series 3 images 57 and 49, respectively). Ligaments Suboptimally assessed by CT. Muscles and Tendons There is heterogeneous partially rim enhancing low-density within the medial aspect of the distal right sartorius muscle that again extends to the medial skin surface (axial series 2, images 52 through 66). The lowest density, rim enhancing portion measures up to 1.5 x 1.2 x 6.0 cm (transverse by AP by craniocaudal, axial  series 2, image 54 and coronal series 3, image 54), decreased from prior. This again may extend and drained to the distal medial thigh surgical incision site (axial series 2 images 57 through 64). There is fluid tracking up the lateral right thigh in between the lateral subcutaneous fat in the vastus lateralis muscle, overall measuring  up to approximately 3 x 9 x 19 cm (transverse by AP by craniocaudal, axial series 2, image 47 and coronal series 3, image 63). There is similar fluid tracking along the lateral left thigh in between the deep aspect of the subcutaneous fat in the vastus lateralis muscle, measuring up to approximately 2 cm in transverse dimension and 18 cm in craniocaudal dimension. This is seen on the coronal images but is off the lateral plane of view of the axial series 2. There is interval decrease in now minimal complex fluid anteromedial to the sartorius and vastus medialis muscles that minimally extends through the distal anteromedial thigh skin surface (axial series 2, images 44 through 66). Soft tissues There is diffuse moderate subcutaneous fat edema and swelling. Fluid distends the rectum, and there is moderate rectal wall thickening as seen on today's CT abdomen and pelvis. IMPRESSION: Compared to 11/01/2022: 1. Interval postsurgical changes of revised, now bilateral above-the-knee amputation. 2. There are bilateral low-density collections just distal to the amputation stumps extending up the distal lateral greater than medial thigh. These collections contain moderate scattered air and are suspicious for abscesses. These collections appear to extend to the prior surgical incision at the distal stump, possibly draining to the skin surface. 3. There is complex fluid tracking between the lateral thigh subcutaneous fat and the vastus lateralis muscles bilaterally, as above. 4. Interval decrease in size of small complex fluid collections superficial to the anteromedial aspect of the right  sartorius muscle and the junction of the left sartorius and vastus medialis muscles, likely minimally draining to the anteromedial thigh skin surface. Electronically Signed   By: Neita Garnet M.D.   On: 11/26/2022 14:56   DG Chest Portable 1 View  Result Date: 11/26/2022 CLINICAL DATA:  Tachycardia, vomiting, diaphoretic EXAM: PORTABLE CHEST 1 VIEW COMPARISON:  10/08/2022 FINDINGS: Diffuse symmetric and bilateral interstitial opacities favored to be interstitial edema/early CHF. Mild cardiac enlargement. No large effusion or pneumothorax. Trachea midline. Monitor leads overlie the chest. IMPRESSION: Cardiomegaly with diffuse interstitial edema pattern. Electronically Signed   By: Judie Petit.  Shick M.D.   On: 11/26/2022 14:54   CT ABDOMEN PELVIS W CONTRAST  Result Date: 11/26/2022 CLINICAL DATA:  Vomiting and generalized abdominal pain 1 week after bilateral above knee amputation. EXAM: CT ABDOMEN AND PELVIS WITH CONTRAST TECHNIQUE: Multidetector CT imaging of the abdomen and pelvis was performed using the standard protocol following bolus administration of intravenous contrast. RADIATION DOSE REDUCTION: This exam was performed according to the departmental dose-optimization program which includes automated exposure control, adjustment of the mA and/or kV according to patient size and/or use of iterative reconstruction technique. CONTRAST:  OMNIPAQUE IOHEXOL 300 MG/ML  SOLN COMPARISON:  April 05, 2022. FINDINGS: Lower chest: Minimal bibasilar subsegmental atelectasis or edema is noted. Hepatobiliary: No focal liver abnormality is seen. No gallstones, gallbladder wall thickening, or biliary dilatation. Pancreas: Unremarkable. No pancreatic ductal dilatation or surrounding inflammatory changes. Spleen: Normal in size without focal abnormality. Adrenals/Urinary Tract: Adrenal glands are unremarkable. Kidneys are normal, without renal calculi, focal lesion, or hydronephrosis. Bladder is unremarkable.  Stomach/Bowel: The stomach is unremarkable. The appendix appears normal. There is no evidence of bowel obstruction. Moderate wall thickening of the colon and rectum is noted most consistent with infectious or inflammatory colitis. Vascular/Lymphatic: No significant vascular findings are present. No enlarged abdominal or pelvic lymph nodes. Reproductive: Prostate is unremarkable. Other: No ascites or hernia is noted.  Mild anasarca is noted. Musculoskeletal: No acute or significant osseous findings. IMPRESSION: Moderate  diffuse colonic wall thickening is noted suggesting infectious or inflammatory colitis. Minimal bibasilar subsegmental atelectasis or edema is noted. Electronically Signed   By: Lupita Raider M.D.   On: 11/26/2022 14:07   - pertinent xrays, CT, MRI studies were reviewed and independently interpreted  Positive ROS: All other systems have been reviewed and were otherwise negative with the exception of those mentioned in the HPI and as above.  Physical Exam: General: Alert, no acute distress Psychiatric: Patient is competent for consent with normal mood and affect Lymphatic: No axillary or cervical lymphadenopathy Cardiovascular: No pedal edema Respiratory: No cyanosis, no use of accessory musculature GI: No organomegaly, abdomen is soft and non-tender    Images:  @ENCIMAGES @  Labs:  Lab Results  Component Value Date   HGBA1C 5.9 (H) 11/18/2022   HGBA1C 8.8 (A) 06/18/2022   HGBA1C 11.2 (H) 03/23/2022   ESRSEDRATE 84 (H) 10/31/2022   ESRSEDRATE 93 (H) 10/28/2022   ESRSEDRATE 128 (H) 10/04/2022   CRP 18.6 (H) 10/31/2022   CRP 14.0 (H) 10/28/2022   CRP 17.2 (H) 10/09/2022   REPTSTATUS PENDING 11/26/2022   REPTSTATUS PENDING 11/26/2022   GRAMSTAIN  11/12/2022    FEW WBC PRESENT,BOTH PMN AND MONONUCLEAR NO ORGANISMS SEEN    CULT GRAM POSITIVE COCCI IN CLUSTERS 11/26/2022   CULT  11/26/2022    NO GROWTH 2 DAYS Performed at [facility], [address],  [place], [place] [address]    LABORGA STENOTROPHOMONAS MALTOPHILIA 11/07/2022   [facility] KLEBSIELLA PNEUMONIAE 11/07/2022    Lab Results  Component Value Date   ALBUMIN 1.6 (L) 11/27/2022   ALBUMIN <1.5 (L) 11/26/2022   ALBUMIN <1.5 (L) 11/17/2022   PREALBUMIN <5 (L) 10/04/2022        Latest Ref Rng & Units 11/27/2022    5:25 AM 11/26/2022    8:13 AM 11/22/2022    4:20 AM  CBC EXTENDED  WBC 4.0 - 10.5 K/uL 21.0  17.9  15.3   RBC 3.87 - 5.11 MIL/uL 3.25  2.74  2.64   Hemoglobin 12.0 - 15.0 g/dL 9.9  8.5  8.2   HCT 10.2 - 46.0 % 30.5  25.8  24.9   Platelets 150 - 400 K/uL 882  742  588   NEUT# 1.7 - 7.7 K/uL   11.0   Lymph# 0.7 - 4.0 K/uL   2.7     Neurologic: Patient does not have protective sensation bilateral lower extremities.   MUSCULOSKELETAL:   Skin: Examination patient has decreased swelling in both thighs from her discharge status.  The incisions are healing well the wound edges are well-approximated there is a small amount of clear serosanguineous drainage.  Is no tenderness to palpation in the thigh.  White cell count 21,000 which is essentially unchanged.  Hemoglobin stable at 9.9.  Albumin has actually improved to 1.6.  Excellent improvement in her hemoglobin A1c at 5.9.  CT scan shows fluid in the subcutaneous tissue both thighs.  Assessment: Assessment: Improvement in both thighs with no clinical signs of abscess no purulent drainage.  Plan: Plan: Would continue the IV antibiotics and discharge on oral antibiotics to ensure that the lymphatic fluid that is draining does not become infected.  Recommended that she bring the stump shrinkers from the skilled nursing facility to the hospital so she could wear these around-the-clock.  Continue dry dressing changes as needed.  Thank you for the consult and the opportunity to see Ms. Kelli Kick, MD [facility] [phone] 8:30 AM

## 2022-11-28 NOTE — Plan of Care (Signed)
Pt has rested quietly throughout the night with no distress noted. Alert and oriented. On room air. ST on the monitor. HR in the 120's when had low grade temp. Medicated with dilaudid every 3 hours per pt request. LR at 100. Voids per bedpan. 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 No complaints voiced.     Problem: Education: Goal: Knowledge of the prescribed therapeutic regimen will improve Outcome: Progressing Goal: Ability to verbalize activity precautions or restrictions will improve Outcome: Progressing Goal: Understanding of discharge needs will improve Outcome: Progressing   Problem: Activity: Goal: Ability to perform//tolerate increased activity and mobilize with assistive devices will improve Outcome: Progressing   Problem: Clinical Measurements: Goal: Postoperative complications will be avoided or minimized Outcome: Progressing   Problem: Self-Care: Goal: Ability to meet self-care needs will improve Outcome: Progressing   Problem: Self-Concept: Goal: Ability to maintain and perform role responsibilities to the fullest extent possible will improve Outcome: Progressing   Problem: Pain Management: Goal: Pain level will decrease with appropriate interventions Outcome: Progressing   Problem: Skin Integrity: Goal: Demonstration of wound healing without infection will improve Outcome: Progressing   Problem: Activity: Goal: Ability to tolerate increased activity will improve Outcome: Progressing   Problem: Respiratory: Goal: Ability to maintain a clear airway and adequate ventilation will improve Outcome: Progressing   Problem: Role Relationship: Goal: Method of communication will improve Outcome: Progressing   Problem: Education: Goal: Knowledge of General Education information will improve Description: Including pain rating scale, medication(s)/side effects and non-pharmacologic comfort measures Outcome: Progressing   Problem: Health Behavior/Discharge  Planning: Goal: Ability to manage health-related needs will improve Outcome: Progressing   Problem: Clinical Measurements: Goal: Ability to maintain clinical measurements within normal limits will improve Outcome: Progressing Goal: Will remain free from infection Outcome: Progressing Goal: Diagnostic test results will improve Outcome: Progressing Goal: Respiratory complications will improve Outcome: Progressing Goal: Cardiovascular complication will be avoided Outcome: Progressing   Problem: Activity: Goal: Risk for activity intolerance will decrease Outcome: Progressing   Problem: Nutrition: Goal: Adequate nutrition will be maintained Outcome: Progressing   Problem: Coping: Goal: Level of anxiety will decrease Outcome: Progressing   Problem: Elimination: Goal: Will not experience complications related to bowel motility Outcome: Progressing Goal: Will not experience complications related to urinary retention Outcome: Progressing   Problem: Pain Managment: Goal: General experience of comfort will improve Outcome: Progressing   Problem: Safety: Goal: Ability to remain free from injury will improve Outcome: Progressing   Problem: Skin Integrity: Goal: Risk for impaired skin integrity will decrease Outcome: Progressing   Problem: Education: Goal: Ability to describe self-care measures that may prevent or decrease complications (Diabetes Survival Skills Education) will improve Outcome: Progressing Goal: Individualized Educational Video(s) Outcome: Progressing   Problem: Coping: Goal: Ability to adjust to condition or change in health will improve Outcome: Progressing   Problem: Fluid Volume: Goal: Ability to maintain a balanced intake and output will improve Outcome: Progressing   Problem: Health Behavior/Discharge Planning: Goal: Ability to identify and utilize available resources and services will improve Outcome: Progressing Goal: Ability to manage  health-related needs will improve Outcome: Progressing   Problem: Metabolic: Goal: Ability to maintain appropriate glucose levels will improve Outcome: Progressing   Problem: Nutritional: Goal: Maintenance of adequate nutrition will improve Outcome: Progressing Goal: Progress toward achieving an optimal weight will improve Outcome: Progressing   Problem: Skin Integrity: Goal: Risk for impaired skin integrity will decrease Outcome: Progressing   Problem: Tissue Perfusion: Goal: Adequacy of tissue perfusion will improve Outcome: Progressing

## 2022-11-28 NOTE — [place] FL2 (Signed)
[place] MEDICAID FL2 LEVEL OF CARE FORM     IDENTIFICATION  Patient Name: Kelli Hudson Birthdate: 11-14-1990 Sex: female Admission Date (Current Location): 11/26/2022  [facility] and [place] Number:  Producer, television/film/video and Address:  [facility], [address] [place], [place] [address]      Provider Number:    Attending Physician Name and Address:  Leroy Sea, MD  Relative Name and Phone Number:       Current Level of Care: Hospital Recommended Level of Care: Skilled Nursing Facility Prior Approval Number:    Date Approved/Denied:   PASRR Number: pending  Discharge Plan: SNF    Current Diagnoses: Patient Active Problem List   Diagnosis Date Noted   Abscess of thigh 11/28/2022   C. difficile diarrhea 11/28/2022   Hypokalemia 11/27/2022   Effusion, left knee 11/05/2022   MDD (major depressive disorder), recurrent episode, moderate (HCC) 10/16/2022   Necrotizing fasciitis of pelvic region and thigh (HCC) 10/08/2022   Necrotizing fasciitis of lower leg (HCC) 10/08/2022   Necrotizing fasciitis (HCC) 10/08/2022   Sepsis due to cellulitis (HCC) 10/03/2022   Cellulitis of lower extremity 10/03/2022   Multinodular goiter 06/18/2022   Malnutrition of moderate degree 04/18/2022   Intractable vomiting with nausea 04/17/2022   DKA (diabetic ketoacidosis) (HCC) 03/04/2022   Refractory nausea and vomiting 12/25/2021   Diabetic gastroparesis (HCC) 12/07/2021   Intractable nausea and vomiting 10/09/2021   Drug-seeking behavior 09/21/2021   Essential hypertension 08/25/2021   GERD without esophagitis 08/25/2021   Hematemesis    Prolonged Q-T interval on ECG    Nausea and vomiting 07/20/2021   Hypomagnesemia 04/21/2021   Ulcerated, foot, left, with fat layer exposed (HCC) 04/20/2021   Uncontrolled type 1 diabetes mellitus with hyperglycemia, with long-term current use of insulin (HCC) 12/18/2020   DM type 1 (diabetes mellitus, type  1) (HCC) 12/18/2020   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) 11/09/2020   Diabetic retinopathy of both eyes associated with type 1 diabetes mellitus (HCC) 09/24/2020   Diabetic gastroparesis associated with type 1 diabetes mellitus (HCC) 08/12/2020   AKI (acute kidney injury) (HCC) 07/25/2020   Generalized abdominal pain 07/23/2020   Diabetic nephropathy associated with type 1 diabetes mellitus (HCC) 06/27/2020   Sepsis (HCC) 05/27/2020   HLD (hyperlipidemia) 05/23/2020   Sinus tachycardia 05/09/2020   Anemia 04/20/2020   Depression with anxiety 07/05/2019   Diabetic polyneuropathy associated with type 1 diabetes mellitus (HCC) 09/24/2017   Gastroparesis    Goiter 10/05/2009   Hypertension associated with diabetes (HCC) 10/05/2009    Orientation RESPIRATION BLADDER Height & Weight     Self, Time, Situation, Place  Normal Continent Weight: 165 lb 5.5 oz (75 kg) Height:     BEHAVIORAL SYMPTOMS/MOOD NEUROLOGICAL BOWEL NUTRITION STATUS      Continent Diet (See DC Summary)  AMBULATORY STATUS COMMUNICATION OF NEEDS Skin   Limited Assist Verbally Skin abrasions                       Personal Care Assistance Level of Assistance  Bathing, Dressing Bathing Assistance: Maximum assistance Feeding assistance: Independent Dressing Assistance: Maximum assistance     Functional Limitations Info  Sight Sight Info: Impaired Hearing Info: Adequate Speech Info: Adequate    SPECIAL CARE FACTORS FREQUENCY  PT (By licensed PT), OT (By licensed OT)     PT Frequency: 5X/ Week OT Frequency: 5X/ Week  Contractures      Additional Factors Info  Code Status, Allergies, Isolation Precautions, Insulin Sliding Scale Code Status Info: Full Allergies Info: Ibuprofen; Lisinopril   Insulin Sliding Scale Info: See dc summary Isolation Precautions Info: ESBL     Current Medications (11/28/2022):  This is the current hospital active medication  list Current Facility-Administered Medications  Medication Dose Route Frequency Provider Last Rate Last Admin   acetaminophen (TYLENOL) tablet 650 mg  650 mg Oral Q6H PRN Elgergawy, Leana Roe, MD   650 mg at 11/27/22 1357   Or   acetaminophen (TYLENOL) suppository 650 mg  650 mg Rectal Q6H PRN Elgergawy, Leana Roe, MD       ascorbic acid (VITAMIN C) tablet 1,000 mg  1,000 mg Oral Daily Elgergawy, Leana Roe, MD   1,000 mg at 11/28/22 0935   enoxaparin (LOVENOX) injection 40 mg  40 mg Subcutaneous Q24H Elgergawy, Leana Roe, MD   40 mg at 11/27/22 2254   [START ON 11/29/2022] fentaNYL (DURAGESIC) 50 MCG/HR 1 patch  1 patch Transdermal Q72H Shahmehdi, Seyed A, MD       gabapentin (NEURONTIN) capsule 300 mg  300 mg Oral Q8H Elgergawy, Leana Roe, MD   300 mg at 11/28/22 4132   hydrALAZINE (APRESOLINE) injection 10 mg  10 mg Intravenous Q4H PRN Elgergawy, Leana Roe, MD   10 mg at 11/28/22 1208   HYDROmorphone (DILAUDID) injection 1 mg  1 mg Intravenous Q3H PRN Elgergawy, Leana Roe, MD   1 mg at 11/28/22 1159   insulin aspart (novoLOG) injection 0-5 Units  0-5 Units Subcutaneous QHS Elgergawy, Leana Roe, MD   3 Units at 11/26/22 2342   insulin aspart (novoLOG) injection 0-9 Units  0-9 Units Subcutaneous TID WC Elgergawy, Leana Roe, MD   1 Units at 11/28/22 1200   insulin detemir (LEVEMIR) injection 4 Units  4 Units Subcutaneous BID Elgergawy, Leana Roe, MD   4 Units at 11/27/22 2254   ketorolac (TORADOL) 15 MG/ML injection 15 mg  15 mg Intravenous Q6H PRN Elgergawy, Leana Roe, MD   15 mg at 11/26/22 2025   metoCLOPramide (REGLAN) injection 10 mg  10 mg Intravenous Q8H Susa Raring K, MD   10 mg at 11/28/22 1158   ondansetron (ZOFRAN) injection 4 mg  4 mg Intravenous Q8H PRN Elgergawy, Leana Roe, MD   4 mg at 11/28/22 0846   Oral care mouth rinse  15 mL Mouth Rinse PRN Elgergawy, Leana Roe, MD       sucralfate (CARAFATE) tablet 1 g  1 g Oral TID WC & HS Elgergawy, Leana Roe, MD   1 g at 11/28/22 1203   vancomycin  (VANCOCIN) capsule 125 mg  125 mg Oral QID Daiva Eves, Lisette Grinder, MD   125 mg at 11/28/22 1203   zinc sulfate capsule 220 mg  220 mg Oral Daily Elgergawy, Leana Roe, MD   220 mg at 11/28/22 0935     Discharge Medications: Please see discharge summary for a list of discharge medications.  Relevant Imaging Results:  Relevant Lab Results:   Additional Information SSN: [id number]  Mearl Latin, LCSW

## 2022-11-28 NOTE — Progress Notes (Signed)
RE: Kelli Hudson Date of Birth: [birth year] Date: 11/28/22  Please be advised that the above-named patient will require a short-term nursing home stay - anticipated 30 days or less for rehabilitation and strengthening.  The plan is for return home.

## 2022-11-28 NOTE — Progress Notes (Signed)
PROGRESS NOTE                                                                                                                                                                                                             Patient Demographics:    Kelli Hudson, is a 32 y.o. female, DOB - 1990-03-01, WGN:[id number]  Outpatient Primary MD for the patient is Norm Salt, [place]    LOS - 2  Admit date - 11/26/2022    Chief Complaint  Patient presents with   Emesis       Brief Narrative (HPI from H&P)   32 y.o. 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  Was discharged to SNF.  Now being admitted from SNF for nausea vomiting some ongoing lower extremity stump pain.   Subjective:    [employer] today has, No headache, No chest pain, No abdominal pain, No new weakness tingling or numbness, no SOB, has some ongoing right-sided stomach pain and nausea,   Assessment  & Plan :   Possible sepsis present on admission s/p bilateral AKA with possible infection at the stump site more likely on the right than left.  CTA images reviewed, seen by Dr. Lajoyce Corners, case discussed with ID physician Dr. Algis Liming.  Orthopedics is of the opinion that this is postop lymphatic fluid collection, ID does not think patient has infection and has discontinued all antibiotics.  Monitor inflammatory markers and clinically.  Acute on chronic nausea vomiting due to gastroparesis, initially had some diarrhea but no bowel movements in the last 2 days.  Abdominal exam benign.  Supportive care, scheduled Reglan, as needed antiemetics, bowel rest, IV fluids, repeat CT abdomen pelvis to rule out ileus, case discussed with Dr. Algis Liming ID hold off all  antibiotics for now.  Clinically low likelihood of C. difficile.  Repeating CT abdomen pelvis and monitoring.  If diarrhea reoccurs repeat stool studies.  GERD.  Supportive care for now.  Hypokalemia.  Replaced.   Hypertension.  Blood pressure medications adjusted.  Diabetic peripheral neuropathy.  On Neurontin.  DM type I.  Not on DKA.  Low-dose Semglee and sliding scale.  Monitor  Lab Results  Component Value Date  HGBA1C 5.9 (H) 11/18/2022   CBG (last 3)  Recent Labs    11/27/22 2130 11/28/22 0829 11/28/22 1132  GLUCAP 84 93 127*        Condition - Extremely Guarded  Family Communication  :  None  Code Status :  Full  Consults  :  Ortho, ID  PUD Prophylaxis :    Procedures  :     CT -  Compared to 11/01/2022: 1. Interval postsurgical changes of revised, now bilateral above-the-knee amputation. 2. There are bilateral low-density collections just distal to the amputation stumps extending up the distal lateral greater than medial thigh. These collections contain moderate scattered air and are suspicious for abscesses. These collections appear to extend to the prior surgical incision at the distal stump, possibly draining to the skin surface. 3. There is complex fluid tracking between the lateral thigh subcutaneous fat and the vastus lateralis muscles bilaterally, as above. 4. Interval decrease in size of small complex fluid collections superficial to the anteromedial aspect of the right sartorius muscle and the junction of the left sartorius and vastus medialis muscles, likely minimally draining to the anteromedial thigh skin surface.      Disposition Plan  :    Status is: Inpatient  DVT Prophylaxis  :    enoxaparin (LOVENOX) injection 40 mg Start: 11/26/22 2200   Lab Results  Component Value Date   PLT 882 (H) 11/27/2022    Diet :  Diet Order             Diet heart healthy/carb modified Room service appropriate? Yes; Fluid consistency: Thin  Diet  effective now                    Inpatient Medications  Scheduled Meds:  ascorbic acid  1,000 mg Oral Daily   enoxaparin (LOVENOX) injection  40 mg Subcutaneous Q24H   [START ON 11/29/2022] fentaNYL  1 patch Transdermal Q72H   gabapentin  300 mg Oral Q8H   insulin aspart  0-5 Units Subcutaneous QHS   insulin aspart  0-9 Units Subcutaneous TID WC   insulin detemir  4 Units Subcutaneous BID   metoCLOPramide (REGLAN) injection  10 mg Intravenous Q8H   sucralfate  1 g Oral TID WC & HS   vancomycin  125 mg Oral QID   zinc sulfate  220 mg Oral Daily   Continuous Infusions: PRN Meds:.acetaminophen **OR** acetaminophen, hydrALAZINE, HYDROmorphone (DILAUDID) injection, ketorolac, ondansetron (ZOFRAN) IV, mouth rinse   Objective:   Vitals:   11/28/22 0000 11/28/22 0200 11/28/22 0400 11/28/22 0800  BP: (!) 155/112  123/81 (!) 165/115  Pulse:  (!) 122 (!) 105 (!) 122  Resp: 15 (!) 21 (!) 8 11  Temp: 99.9 F (37.7 C)  98.4 F (36.9 C) 98.5 F (36.9 C)  TempSrc: Oral  Oral Oral  SpO2:  98% 99% 100%  Weight:        Wt Readings from Last 3 Encounters:  11/26/22 75 kg  06/18/22 83.5 kg  06/16/22 83.9 kg     Intake/Output Summary (Last 24 hours) at 11/28/2022 1050 Last data filed at 11/28/2022 0600 Gross per 24 hour  Intake 1008.19 ml  Output --  Net 1008.19 ml     Physical Exam  Awake Alert, No new F.N deficits, Normal affect [place].AT,PERRAL Supple Neck, No JVD,   Symmetrical Chest wall movement, Good air movement bilaterally, CTAB RRR,No Gallops,Rubs or new Murmurs,  +ve B.Sounds, Abd Soft, No tenderness,   Bilateral BKA,  right BKA stump under bandage    Data Review:    Recent Labs  Lab 11/22/22 0420 11/26/22 0813 11/27/22 0525  WBC 15.3* 17.9* 21.0*  HGB 8.2* 8.5* 9.9*  HCT 24.9* 25.8* 30.5*  PLT 588* 742* 882*  MCV 94.3 94.2 93.8  MCH 31.1 31.0 30.5  MCHC 32.9 32.9 32.5  RDW 13.7 13.4 13.6  LYMPHSABS 2.7  --   --   MONOABS 1.2*  --   --   EOSABS  0.3  --   --   BASOSABS 0.1  --   --     Recent Labs  Lab 11/22/22 0420 11/26/22 0813 11/26/22 1059 11/27/22 0525  NA 137 133*  --  138  K 4.4 4.3  --  3.2*  CL 109 107  --  110  CO2 21* 21*  --  18*  ANIONGAP 7 5  --  10  GLUCOSE 178* 298*  --  223*  BUN 36* 26*  --  24*  CREATININE 0.87 0.74  --  0.81  AST  --  25  --  21  ALT  --  31  --  27  ALKPHOS  --  492*  --  462*  BILITOT  --  0.5  --  0.9  ALBUMIN  --  <1.5*  --  1.6*  LATICACIDVEN  --   --  1.0  --   CALCIUM 7.8* 8.1*  --  8.7*      Recent Labs  Lab 11/22/22 0420 11/26/22 0813 11/26/22 1059 11/27/22 0525  LATICACIDVEN  --   --  1.0  --   CALCIUM 7.8* 8.1*  --  8.7*     Radiology Reports CT EXTREM LOWER W CM BIL  Result Date: 11/26/2022 CLINICAL DATA:  Soft tissue infection suspected, lower leg. X-ray done. Status post bilateral above the knee amputation 1 week ago. Patient has been vomiting all morning. EXAM: CT OF THE LOWER BILATERAL EXTREMITY WITH CONTRAST TECHNIQUE: Multidetector CT imaging of the lower bilateral extremity was performed according to the standard protocol following intravenous contrast administration. RADIATION DOSE REDUCTION: This exam was performed according to the departmental dose-optimization program which includes automated exposure control, adjustment of the mA and/or kV according to patient size and/or use of iterative reconstruction technique. COMPARISON:  CT right tibia and fibula and right femur 11/01/2022, CT left tibia and fibula and left femur 11/01/2022 CONTRAST:  OMNIPAQUE IOHEXOL 300 MG/ML  SOLN FINDINGS: Bones/Joint/Cartilage Mild bilateral superolateral acetabular degenerative osteophytosis. Minimal superior pubic symphysis joint space narrowing. Interval postsurgical changes of bilateral above-the-knee amputation. Starting at the superior aspect of the femoral head, the right femur measures up to approximately 30.8 cm in the left femur measures up to approximately 30.3  cm. The distal femoral diaphyseal amputation margins are sharp. There are surgical skin staples at the distal aspect of each soft tissue distal amputation stump. Right lower extremity: There is a low-density collection just distal to the amputation stump and extending up the distal lateral greater than medial thigh (axial series 2 images 58 through 73 and axial series 5 images 1 through 17). This collection is of moderate density and contains moderate scattered air. There is mild peripheral rim enhancement. This is suspicious for an abscess, and appears to extend to the prior surgical incision at the distal stump, possibly draining to the skin surface (coronal series 7, images 43-45). The irregular size of this collection makes measurements difficult, however it appears to measure up to approximately 11  cm in transverse dimension. Extends up the lateral thigh approximately 8.5 cm (as measured on coronal series 3, image 62) and the medial thigh up to approximately 4.6 cm. No cortical erosion to indicate acute osteomyelitis. Left lower extremity: Simple are to the contralateral side, there is a low-density collection just distal to the amputation stump extending up the distal lateral greater than medial thigh (axial series 2 images 47 through 68 and axial series 5 images 1 through 12). This collection is similar moderate density and contains more mild scattered air compared to the contralateral leg. There is mild peripheral rim enhancement. A thin tract is seen from this fluid draining to the distal anterior amputation stump surgical incision at the skin surface (coronal series 3 images 42 through 45, axial series 5 images 13 through 15). This irregular collection measures up to approximately 10 cm in greatest transverse dimension. It extends up the lateral thigh approximately 8 cm and up the medial thigh approximately 5.5 cm (as measured on coronal series 3 images 57 and 49, respectively). Ligaments Suboptimally  assessed by CT. Muscles and Tendons There is heterogeneous partially rim enhancing low-density within the medial aspect of the distal right sartorius muscle that again extends to the medial skin surface (axial series 2, images 52 through 66). The lowest density, rim enhancing portion measures up to 1.5 x 1.2 x 6.0 cm (transverse by AP by craniocaudal, axial series 2, image 54 and coronal series 3, image 54), decreased from prior. This again may extend and drained to the distal medial thigh surgical incision site (axial series 2 images 57 through 64). There is fluid tracking up the lateral right thigh in between the lateral subcutaneous fat in the vastus lateralis muscle, overall measuring up to approximately 3 x 9 x 19 cm (transverse by AP by craniocaudal, axial series 2, image 47 and coronal series 3, image 63). There is similar fluid tracking along the lateral left thigh in between the deep aspect of the subcutaneous fat in the vastus lateralis muscle, measuring up to approximately 2 cm in transverse dimension and 18 cm in craniocaudal dimension. This is seen on the coronal images but is off the lateral plane of view of the axial series 2. There is interval decrease in now minimal complex fluid anteromedial to the sartorius and vastus medialis muscles that minimally extends through the distal anteromedial thigh skin surface (axial series 2, images 44 through 66). Soft tissues There is diffuse moderate subcutaneous fat edema and swelling. Fluid distends the rectum, and there is moderate rectal wall thickening as seen on today's CT abdomen and pelvis. IMPRESSION: Compared to 11/01/2022: 1. Interval postsurgical changes of revised, now bilateral above-the-knee amputation. 2. There are bilateral low-density collections just distal to the amputation stumps extending up the distal lateral greater than medial thigh. These collections contain moderate scattered air and are suspicious for abscesses. These collections  appear to extend to the prior surgical incision at the distal stump, possibly draining to the skin surface. 3. There is complex fluid tracking between the lateral thigh subcutaneous fat and the vastus lateralis muscles bilaterally, as above. 4. Interval decrease in size of small complex fluid collections superficial to the anteromedial aspect of the right sartorius muscle and the junction of the left sartorius and vastus medialis muscles, likely minimally draining to the anteromedial thigh skin surface. Electronically Signed   By: Neita Garnet M.D.   On: 11/26/2022 14:56   DG Chest Portable 1 View  Result Date: 11/26/2022 CLINICAL DATA:  Tachycardia, vomiting, diaphoretic EXAM: PORTABLE CHEST 1 VIEW COMPARISON:  10/08/2022 FINDINGS: Diffuse symmetric and bilateral interstitial opacities favored to be interstitial edema/early CHF. Mild cardiac enlargement. No large effusion or pneumothorax. Trachea midline. Monitor leads overlie the chest. IMPRESSION: Cardiomegaly with diffuse interstitial edema pattern. Electronically Signed   By: Judie Petit.  Shick M.D.   On: 11/26/2022 14:54   CT ABDOMEN PELVIS W CONTRAST  Result Date: 11/26/2022 CLINICAL DATA:  Vomiting and generalized abdominal pain 1 week after bilateral above knee amputation. EXAM: CT ABDOMEN AND PELVIS WITH CONTRAST TECHNIQUE: Multidetector CT imaging of the abdomen and pelvis was performed using the standard protocol following bolus administration of intravenous contrast. RADIATION DOSE REDUCTION: This exam was performed according to the departmental dose-optimization program which includes automated exposure control, adjustment of the mA and/or kV according to patient size and/or use of iterative reconstruction technique. CONTRAST:  OMNIPAQUE IOHEXOL 300 MG/ML  SOLN COMPARISON:  April 05, 2022. FINDINGS: Lower chest: Minimal bibasilar subsegmental atelectasis or edema is noted. Hepatobiliary: No focal liver abnormality is seen. No gallstones,  gallbladder wall thickening, or biliary dilatation. Pancreas: Unremarkable. No pancreatic ductal dilatation or surrounding inflammatory changes. Spleen: Normal in size without focal abnormality. Adrenals/Urinary Tract: Adrenal glands are unremarkable. Kidneys are normal, without renal calculi, focal lesion, or hydronephrosis. Bladder is unremarkable. Stomach/Bowel: The stomach is unremarkable. The appendix appears normal. There is no evidence of bowel obstruction. Moderate wall thickening of the colon and rectum is noted most consistent with infectious or inflammatory colitis. Vascular/Lymphatic: No significant vascular findings are present. No enlarged abdominal or pelvic lymph nodes. Reproductive: Prostate is unremarkable. Other: No ascites or hernia is noted.  Mild anasarca is noted. Musculoskeletal: No acute or significant osseous findings. IMPRESSION: Moderate diffuse colonic wall thickening is noted suggesting infectious or inflammatory colitis. Minimal bibasilar subsegmental atelectasis or edema is noted. Electronically Signed   By: Lupita Raider M.D.   On: 11/26/2022 14:07      Signature  -   Susa Raring M.D on 11/28/2022 at 10:50 AM   -  To page go to [email or web address]

## 2022-11-28 NOTE — Evaluation (Addendum)
Physical Therapy Evaluation Patient Details Name: Kelli Hudson MRN: [id number] DOB: [birth year] Today's Date: 11/28/2022  History of Present Illness  Pt is a 32 y/o F admitted on 11/26/22 after presenting from facility with c/o N&V, abdominal pain. Pt is being treated for sepsis, C. Difficile colitis. PMH: DM1, diabetic gastroparesis, HTN, recent admission for B necrotizing fasciitis requiring extensive debridement & amputations  Clinical Impression  Pt seen for PT evaluation with pt agreeable. Pt reports she was working on A/P transfers in SNF rehab prior to return to hospital. On this date, pt is motivated to participate, very pleasant, but still with c/o nausea throughout session. Pt is able to complete A/P transfer to bed with supervision. Will continue to follow pt acutely to address sitting balance, strengthening, transfers, & w/c mobility. PT educated pt on prosthesis process as well.    Max HR 125 bpm      If plan is discharge home, recommend the following: A lot of help with walking and/or transfers;A little help with bathing/dressing/bathroom;Assistance with cooking/housework;Assist for transportation;Help with stairs or ramp for entrance   Can travel by private vehicle   No    Equipment Recommendations Wheelchair (measurements PT);Wheelchair cushion (measurements PT)  Recommendations for Other Services       Functional Status Assessment Patient has had a recent decline in their functional status and demonstrates the ability to make significant improvements in function in a reasonable and predictable amount of time.     Precautions / Restrictions Precautions Precautions: Fall Restrictions Weight Bearing Restrictions: Yes RLE Weight Bearing: Non weight bearing LLE Weight Bearing: Non weight bearing      Mobility  Bed Mobility               General bed mobility comments: Pt received semi fowler in bed, able to transition to long sitting with B rails  without assistance. Pt able to turn in bed for set up for A/P transfer to recliner without assistance.    Transfers Overall transfer level: Needs assistance   Transfers: Bed to chair/wheelchair/BSC         Anterior-Posterior transfers: Supervision (equal height surfaces, A>P bed>recliner)        Ambulation/Gait                  Stairs            Wheelchair Mobility     Tilt Bed    Modified Rankin (Stroke Patients Only)       Balance Overall balance assessment: Needs assistance Sitting-balance support: Bilateral upper extremity supported Sitting balance-Leahy Scale: Fair                                       Pertinent Vitals/Pain Pain Assessment Pain Assessment: Faces Faces Pain Scale: Hurts a little bit Pain Location: generalized & c/o nausea Pain Intervention(s): Monitored during session    Home Living Family/patient expects to be discharged to:: Skilled nursing facility                        Prior Function               Mobility Comments: Pt with recent B AKAs & d/c to SNF. Pt reports she's been working on A/P transfers to [facility].       Extremity/Trunk Assessment   Upper Extremity Assessment Upper Extremity Assessment: Generalized weakness  Lower Extremity Assessment Lower Extremity Assessment: Generalized weakness (BLE AKAs with dressings on them)       Communication   Communication Communication: No apparent difficulties  Cognition Arousal: Alert Behavior During Therapy: WFL for tasks assessed/performed Overall Cognitive Status: Within Functional Limits for tasks assessed                                 General Comments: Pleasant, motivated to participate.        General Comments      Exercises     Assessment/Plan    PT Assessment Patient needs continued PT services  PT Problem List Decreased strength;Decreased range of motion;Decreased activity tolerance;Decreased  balance;Decreased mobility;Decreased coordination;Decreased knowledge of use of DME;Decreased safety awareness;Decreased knowledge of precautions;Pain;Decreased skin integrity;Cardiopulmonary status limiting activity       PT Treatment Interventions DME instruction;Functional mobility training;Therapeutic activities;Therapeutic exercise;Balance training;Neuromuscular re-education;Cognitive remediation;Patient/family education;Wheelchair mobility training;Manual techniques;Modalities;Stair training    PT Goals (Current goals can be found in the Care Plan section)  Acute Rehab PT Goals Patient Stated Goal: be able to get dressed, transfer to w/c PT Goal Formulation: With patient Time For Goal Achievement: 12/12/22 Potential to Achieve Goals: Good Additional Goals Additional Goal #1: Pt will propel w/c x 150 ft with mod I to increase independence with mobility.    Frequency Min 1X/week     Co-evaluation PT/OT/SLP Co-Evaluation/Treatment: Yes Reason for Co-Treatment: For patient/therapist safety PT goals addressed during session: Mobility/safety with mobility;Balance         AM-PAC PT "6 Clicks" Mobility  Outcome Measure Help needed turning from your back to your side while in a flat bed without using bedrails?: None Help needed moving from lying on your back to sitting on the side of a flat bed without using bedrails?: A Little Help needed moving to and from a bed to a chair (including a wheelchair)?: A Little Help needed standing up from a chair using your arms (e.g., wheelchair or bedside chair)?: Total Help needed to walk in hospital room?: Total Help needed climbing 3-5 steps with a railing? : Total 6 Click Score: 13    End of Session   Activity Tolerance: Patient tolerated treatment well Patient left: in chair (in care of OT) Nurse Communication: Mobility status PT Visit Diagnosis: Muscle weakness (generalized) (M62.81);Other abnormalities of gait and mobility (R26.89)     Time: 1000-1015 PT Time Calculation (min) (ACUTE ONLY): 15 min   Charges:   PT Evaluation $PT Eval Low Complexity: 1 Low   PT General Charges $$ ACUTE PT VISIT: 1 Visit         Aleda Grana, PT, DPT 11/28/22, 10:34 AM  Sandi Mariscal 11/28/2022, 10:31 AM

## 2022-11-28 NOTE — TOC Progression Note (Signed)
Transition of Care [facility]) - Progression Note    Patient Details  Name: Kelli Hudson MRN: [id number] Date of Birth: [birth year]  Transition of Care [facility] CM/SW Contact  Mearl Latin, LCSW Phone Number: 11/28/2022, 12:35 PM  Clinical Narrative:    CSW received request to speak with patient's mother at bedside. She stated that patient has not been receiving good care at [facility] and they would like a different facility. CSW explained placement barriers due to insurance and age but faxed out referral to check for beds. CSW made her aware that she can locate facilities outside our service area if she would like and discussed ambulance transport only up to 50 miles without up front payment. CSW also asked liaison for [facility] to see if there are any other beds available for patient. Mother asked about receiving documentation to apply for Guardianship. CSW explained process and that it is difficult to obtain that paperwork from hospital stays but asked MD for direction.   Pasrr has expired and will need documents uploaded.    Expected Discharge Plan: Skilled Nursing Facility Barriers to Discharge: Continued Medical Work up, English as a second language teacher, SNF Pending bed offer  Expected Discharge Plan and Services     Post Acute Care Choice: Skilled Nursing Facility                                         Social Determinants of Health (SDOH) Interventions SDOH Screenings   Food Insecurity: No Food Insecurity (10/03/2022)  Housing: Low Risk  (10/03/2022)  Transportation Needs: No Transportation Needs (10/03/2022)  Utilities: Not At Risk (10/03/2022)  Alcohol Screen: Low Risk  (05/29/2021)  Depression (PHQ2-9): Low Risk  (05/29/2021)  Recent Concern: Depression (PHQ2-9) - Medium Risk (04/10/2021)  Financial Resource Strain: Low Risk  (02/13/2022)   Received from [facility]  Social Connections: Unknown (06/17/2021)   Received from [facility], [facility]   Stress: No Stress Concern Present (09/16/2022)   Received from [facility]  Tobacco Use: Low Risk  (11/27/2022)    Readmission Risk Interventions    11/27/2022    2:33 PM 05/08/2022    9:45 AM 03/24/2022    3:33 PM  Readmission Risk Prevention Plan  Transportation Screening Complete Complete Complete  PCP or Specialist Appt within 3-5 Days Not Complete    HRI or Home Care Consult Complete    Social Work Consult for Recovery Care Planning/Counseling Complete    Palliative Care Screening Not Applicable    Medication Review Oceanographer) Complete Complete Complete  PCP or Specialist appointment within 3-5 days of discharge  Complete Complete  HRI or Home Care Consult  Complete Complete  SW Recovery Care/Counseling Consult  Complete Complete  Palliative Care Screening  Not Applicable Not Applicable  Skilled Nursing Facility  Not Applicable Not Applicable

## 2022-11-28 NOTE — Evaluation (Signed)
Occupational Therapy Evaluation Patient Details Name: Kelli Hudson MRN: [id number] DOB: 02-07-1991 Today's Date: 11/28/2022   History of Present Illness Pt is a 32 y/o F admitted on 11/26/22 after presenting from facility with c/o N&V, abdominal pain. Pt is being treated for sepsis, C. Difficile colitis. PMH: DM1, diabetic gastroparesis, HTN, recent admission for B necrotizing fasciitis requiring extensive debridement & amputations   Clinical Impression   Pt admitted for above, presents as generally weak but able to complete AP transfers to/from recliner with Supervision/setup. She would still benefit from UB strengthening as those are her main source of mobility at the moment. Pt has challenges completing LB ADLs independently and would love to work on getting to a wheelchair. Pt would benefit from continued acute skilled OT services to address deficits and help transition to next level of care. Patient would benefit from post acute skilled rehab facility with <3 hours of therapy and 24/7 support        If plan is discharge home, recommend the following: A little help with walking and/or transfers;Assistance with cooking/housework;Assist for transportation;A lot of help with bathing/dressing/bathroom    Functional Status Assessment  Patient has had a recent decline in their functional status and demonstrates the ability to make significant improvements in function in a reasonable and predictable amount of time.  Equipment Recommendations  Wheelchair (measurements OT);Wheelchair cushion (measurements OT) (drop arm BSC)    Recommendations for Other Services       Precautions / Restrictions Precautions Precautions: Fall Restrictions Weight Bearing Restrictions: Yes RLE Weight Bearing: Non weight bearing LLE Weight Bearing: Non weight bearing      Mobility Bed Mobility Overal bed mobility: Modified Independent                  Transfers Overall transfer level:  Needs assistance   Transfers: Bed to chair/wheelchair/BSC         Anterior-Posterior transfers: Supervision (equal height surfaces, A>P bed>recliner)          Balance Overall balance assessment: Needs assistance Sitting-balance support: Bilateral upper extremity supported Sitting balance-Leahy Scale: Fair Sitting balance - Comments: long sitting in bed                                   ADL either performed or assessed with clinical judgement   ADL   Eating/Feeding: Independent;Bed level   Grooming: Set up;Bed level   Upper Body Bathing: Supervision/ safety;Set up;Bed level   Lower Body Bathing: Moderate assistance;Bed level   Upper Body Dressing : Bed level;Set up   Lower Body Dressing: Maximal assistance;Bed level   Toilet Transfer: Set up;Anterior/posterior;BSC/3in1;Supervision/safety Statistician Details (indicate cue type and reason): simulated with reclienr transfer           General ADL Comments: Reinforced chair pushups, limited session d/t transport arriving for CT     Vision         Perception         Praxis         Pertinent Vitals/Pain Pain Assessment Pain Assessment: Faces Faces Pain Scale: Hurts a little bit Pain Location: generalized & c/o nausea Pain Intervention(s): Monitored during session     Extremity/Trunk Assessment Upper Extremity Assessment Upper Extremity Assessment: Generalized weakness   Lower Extremity Assessment Lower Extremity Assessment: Generalized weakness (bilat AKAs with dressings on them)       Communication Communication Communication: No apparent difficulties   Cognition Arousal:  Alert Behavior During Therapy: WFL for tasks assessed/performed Overall Cognitive Status: Within Functional Limits for tasks assessed                                 General Comments: Pleasant, motivated to participate.     General Comments  HR 125 max    Exercises     Shoulder  Instructions      Home Living Family/patient expects to be discharged to:: Skilled nursing facility                                 Additional Comments: Pt not receptive to returning to current SNF      Prior Functioning/Environment               Mobility Comments: Pt with recent B AKAs & d/c to SNF. Pt reports she's been working on A/P transfers to [facility] ADLs Comments: Has assist with LB ADLs        OT Problem List: Decreased strength;Pain;Other (comment) (impaired ADL performance)      OT Treatment/Interventions: Self-care/ADL training;Balance training;Therapeutic exercise;Therapeutic activities;DME and/or AE instruction;Patient/family education    OT Goals(Current goals can be found in the care plan section) Acute Rehab OT Goals Patient Stated Goal: To work on putting on bottoms;getting in a wheelchair OT Goal Formulation: With patient Time For Goal Achievement: 12/12/22 Potential to Achieve Goals: Good ADL Goals Pt Will Perform Lower Body Bathing: bed level;with modified independence Pt Will Perform Lower Body Dressing: with modified independence;bed level Pt Will Transfer to Toilet: anterior/posterior transfer;with set-up;with supervision Pt Will Perform Toileting - Clothing Manipulation and hygiene: with supervision;with set-up;sitting/lateral leans Pt/caregiver will Perform Home Exercise Program: Increased strength;Both right and left upper extremity;With written HEP provided;Independently  OT Frequency: Min 1X/week    Co-evaluation PT/OT/SLP Co-Evaluation/Treatment: Yes Reason for Co-Treatment: For patient/therapist safety PT goals addressed during session: Mobility/safety with mobility;Balance OT goals addressed during session: ADL's and self-care      AM-PAC OT "6 Clicks" Daily Activity     Outcome Measure Help from another person eating meals?: None Help from another person taking care of personal grooming?: A Little Help from another person  toileting, which includes using toliet, bedpan, or urinal?: A Little Help from another person bathing (including washing, rinsing, drying)?: A Lot Help from another person to put on and taking off regular upper body clothing?: A Little Help from another person to put on and taking off regular lower body clothing?: A Lot 6 Click Score: 17   End of Session Nurse Communication: Mobility status  Activity Tolerance: Patient tolerated treatment well Patient left: in bed;with call bell/phone within reach;Other (comment) (transport in room)  OT Visit Diagnosis: Muscle weakness (generalized) (M62.81);Pain Pain - part of body:  (abdominal)                Time: 1000-1021 OT Time Calculation (min): 21 min Charges:  OT General Charges $OT Visit: 1 Visit OT Evaluation $OT Eval Low Complexity: 1 Low  11/28/2022  AB, OTR/L  Acute Rehabilitation Services  Office: [phone]   Tristan Schroeder 11/28/2022, 10:51 AM

## 2022-11-29 ENCOUNTER — Inpatient Hospital Stay (HOSPITAL_COMMUNITY): Payer: Medicaid Other

## 2022-11-29 DIAGNOSIS — R112 Nausea with vomiting, unspecified: Secondary | ICD-10-CM | POA: Diagnosis not present

## 2022-11-29 LAB — HIV ANTIBODY (ROUTINE TESTING W REFLEX): HIV Screen 4th Generation wRfx: NONREACTIVE

## 2022-11-29 LAB — CBC
HCT: 22 % — ABNORMAL LOW (ref 36.0–46.0)
Hemoglobin: 7.2 g/dL — ABNORMAL LOW (ref 12.0–15.0)
MCH: 30.6 pg (ref 26.0–34.0)
MCHC: 32.7 g/dL (ref 30.0–36.0)
MCV: 93.6 fL (ref 80.0–100.0)
Platelets: 759 10*3/uL — ABNORMAL HIGH (ref 150–400)
RBC: 2.35 MIL/uL — ABNORMAL LOW (ref 3.87–5.11)
RDW: 13.7 % (ref 11.5–15.5)
WBC: 12.3 10*3/uL — ABNORMAL HIGH (ref 4.0–10.5)
nRBC: 0 % (ref 0.0–0.2)

## 2022-11-29 LAB — TSH: TSH: 1.401 u[IU]/mL (ref 0.350–4.500)

## 2022-11-29 LAB — COMPREHENSIVE METABOLIC PANEL
ALT: 22 U/L (ref 0–44)
AST: 21 U/L (ref 15–41)
Albumin: <1.5 g/dL — ABNORMAL LOW (ref 3.5–5.0)
Alkaline Phosphatase: 376 U/L — ABNORMAL HIGH (ref 38–126)
Anion gap: 8 (ref 5–15)
BUN: 15 mg/dL (ref 6–20)
CO2: 19 mmol/L — ABNORMAL LOW (ref 22–32)
Calcium: 8 mg/dL — ABNORMAL LOW (ref 8.9–10.3)
Chloride: 109 mmol/L (ref 98–111)
Creatinine, Ser: 0.94 mg/dL (ref 0.44–1.00)
GFR, Estimated: >60 mL/min (ref >60)
Glucose, Bld: 80 mg/dL (ref 70–99)
Potassium: 3.4 mmol/L — ABNORMAL LOW (ref 3.5–5.1)
Sodium: 136 mmol/L (ref 135–145)
Total Bilirubin: 0.5 mg/dL (ref 0.3–1.2)
Total Protein: 5.9 g/dL — ABNORMAL LOW (ref 6.5–8.1)

## 2022-11-29 LAB — MAGNESIUM: Magnesium: 1.5 mg/dL — ABNORMAL LOW (ref 1.7–2.4)

## 2022-11-29 LAB — GLUCOSE, CAPILLARY
Glucose-Capillary: 119 mg/dL — ABNORMAL HIGH (ref 70–99)
Glucose-Capillary: 157 mg/dL — ABNORMAL HIGH (ref 70–99)
Glucose-Capillary: 69 mg/dL — ABNORMAL LOW (ref 70–99)
Glucose-Capillary: 72 mg/dL (ref 70–99)
Glucose-Capillary: 73 mg/dL (ref 70–99)
Glucose-Capillary: 77 mg/dL (ref 70–99)
Glucose-Capillary: 81 mg/dL (ref 70–99)
Glucose-Capillary: 84 mg/dL (ref 70–99)

## 2022-11-29 LAB — BRAIN NATRIURETIC PEPTIDE: B Natriuretic Peptide: 2042.2 pg/mL — ABNORMAL HIGH (ref 0.0–100.0)

## 2022-11-29 LAB — RPR: RPR Ser Ql: NONREACTIVE

## 2022-11-29 LAB — VITAMIN B12: Vitamin B-12: 475 pg/mL (ref 180–914)

## 2022-11-29 LAB — PROCALCITONIN: Procalcitonin: <0.1 ng/mL

## 2022-11-29 LAB — C-REACTIVE PROTEIN: CRP: 0.8 mg/dL (ref <1.0)

## 2022-11-29 MED ORDER — CARVEDILOL 6.25 MG PO TABS
6.2500 mg | ORAL_TABLET | Freq: Two times a day (BID) | ORAL | Status: DC
  Administered 2022-11-29 – 2022-12-03 (×10): 6.25 mg via ORAL
  Filled 2022-11-29 (×10): qty 1

## 2022-11-29 MED ORDER — MAGNESIUM SULFATE 2 GM/50ML IV SOLN: 2.0000 g | Freq: Once | INTRAVENOUS | Status: DC

## 2022-11-29 MED ORDER — POTASSIUM CHLORIDE 10 MEQ/100ML IV SOLN
10.0000 meq | INTRAVENOUS | Status: AC
  Administered 2022-11-29 (×4): 10 meq via INTRAVENOUS
  Filled 2022-11-29 (×4): qty 100

## 2022-11-29 MED ORDER — INSULIN DETEMIR 100 UNIT/ML ~~LOC~~ SOLN
4.0000 [IU] | Freq: Every day | SUBCUTANEOUS | Status: DC
  Administered 2022-11-30 – 2022-12-03 (×4): 4 [IU] via SUBCUTANEOUS
  Filled 2022-11-29 (×4): qty 0.04

## 2022-11-29 MED ORDER — MAGNESIUM SULFATE 4 GM/100ML IV SOLN
4.0000 g | Freq: Once | INTRAVENOUS | Status: AC
  Administered 2022-11-29: 4 g via INTRAVENOUS
  Filled 2022-11-29: qty 100

## 2022-11-29 NOTE — Progress Notes (Signed)
PROGRESS NOTE                                                                                                                                                                                                             Patient Demographics:    Kelli Hudson, is a 32 y.o. female, DOB - 27-Oct-1990, NWG:[id number]  Outpatient Primary MD for the patient is Norm Salt, [place]    LOS - 3  Admit date - 11/26/2022    Chief Complaint  Patient presents with   Emesis       Brief Narrative (HPI from H&P)   32 y.o. 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  Was discharged to SNF.  Now being admitted from SNF for nausea vomiting some ongoing lower extremity stump pain.   Subjective:   Objective   Assessment  & Plan :   Possible sepsis present on admission s/p bilateral AKA with possible infection at the stump site more likely on the right than left.  CTA images reviewed, seen by Dr. Lajoyce Corners, case discussed with ID physician Dr. Algis Liming.  Orthopedics is of the opinion that this is postop lymphatic fluid collection, ID does not think patient has infection and has discontinued all antibiotics.  Monitor inflammatory markers and clinically.  Acute on chronic nausea vomiting due to gastroparesis, initially had some diarrhea but no bowel movements in the last 2 days.  Abdominal exam benign.  Supportive care, scheduled Reglan, as needed antiemetics, bowel rest, IV fluids, repeat CT abdomen pelvis to rule out ileus, case discussed with Dr. Algis Liming ID hold off all antibiotics for now.  Clinically low likelihood of C. difficile.  Repeat CT scan improved with nonspecific changes, with supportive care nausea vomiting much improved on  11/29/2022 continue to monitor.  GERD.  Supportive care for now.  Hypokalemia and hypomagnesemia.  Replaced.   Hypertension.  Blood pressure medications adjusted.  Diabetic peripheral neuropathy.  On Neurontin.  For memory per patient and family members.  Recent staples at the stumps hence no MRI, repeat CT head, B12, TSH, RPR, HIV screen and monitor.  Outpatient neuro follow-up most likely.  AAO x 3 here.    DM type I.  Not on DKA.  Low-dose Semglee and sliding scale.  Monitor  Lab Results  Component Value Date   HGBA1C 5.9 (H) 11/18/2022   CBG (last 3)  Recent Labs    11/29/22 0121 11/29/22 0504 11/29/22 0831  GLUCAP 84 72 81        Condition - Extremely Guarded  Family Communication  :  None  Code Status :  Full  Consults  :  Ortho, ID  PUD Prophylaxis :    Procedures  :     Repeat CT scan 11/28/2022.   There remains mild wall thickening of descending and sigmoid colon which may represent lack of distension, but infectious or inflammatory colitis cannot be excluded. Probable mild anasarca  CT -  11/26/22: 1. Interval postsurgical changes of revised, now bilateral above-the-knee amputation. 2. There are bilateral low-density collections just distal to the amputation stumps extending up the distal lateral greater than medial thigh. These collections contain moderate scattered air and are suspicious for abscesses. These collections appear to extend to the prior surgical incision at the distal stump, possibly draining to the skin surface. 3. There is complex fluid tracking between the lateral thigh subcutaneous fat and the vastus lateralis muscles bilaterally, as above. 4. Interval decrease in size of small complex fluid collections superficial to the anteromedial aspect of the right sartorius muscle and the junction of the left sartorius and vastus medialis muscles, likely minimally draining to the anteromedial thigh skin surface.      Disposition Plan  :    Status is:  Inpatient  DVT Prophylaxis  :    enoxaparin (LOVENOX) injection 40 mg Start: 11/26/22 2200   Lab Results  Component Value Date   PLT 759 (H) 11/29/2022    Diet :  Diet Order             Diet heart healthy/carb modified Room service appropriate? Yes; Fluid consistency: Thin  Diet effective now                    Inpatient Medications  Scheduled Meds:  ascorbic acid  1,000 mg Oral Daily   carvedilol  6.25 mg Oral BID WC   enoxaparin (LOVENOX) injection  40 mg Subcutaneous Q24H   fentaNYL  1 patch Transdermal Q72H   gabapentin  300 mg Oral Q8H   insulin aspart  0-5 Units Subcutaneous QHS   insulin aspart  0-9 Units Subcutaneous TID WC   insulin detemir  4 Units Subcutaneous BID   metoCLOPramide (REGLAN) injection  10 mg Intravenous Q8H   sucralfate  1 g Oral TID WC & HS   vancomycin  125 mg Oral QID   zinc sulfate  220 mg Oral Daily   Continuous Infusions:  potassium chloride 10 mEq (11/29/22 0752)   PRN Meds:.acetaminophen **OR** acetaminophen, hydrALAZINE, HYDROmorphone (DILAUDID) injection, ketorolac, ondansetron (ZOFRAN) IV, mouth rinse   Objective:   Vitals:   11/29/22 0200 11/29/22 0600 11/29/22 0634 11/29/22 0800  BP: 119/74 133/83 133/83   Pulse: 100  (!) 123   Resp: 13 14    Temp:    98.1 F (36.7 C)  TempSrc:    Oral  SpO2: 98%   99%  Weight:        Wt Readings from Last 3 Encounters:  11/26/22 75 kg  06/18/22 83.5 kg  06/16/22 83.9 kg    No intake or output data in the 24 hours ending 11/29/22 0921    Physical Exam  Awake Alert, No new  F.N deficits, Normal affect [place].AT,PERRAL Supple Neck, No JVD,   Symmetrical Chest wall movement, Good air movement bilaterally, CTAB RRR,No Gallops,Rubs or new Murmurs,  +ve B.Sounds, Abd Soft, No tenderness,   Bilateral BKA, right BKA stump under bandage    Data Review:    Recent Labs  Lab 11/26/22 0813 11/27/22 0525 11/28/22 1403 11/29/22 0239  WBC 17.9* 21.0* 15.0* 12.3*  HGB 8.5* 9.9*  7.5* 7.2*  HCT 25.8* 30.5* 22.7* 22.0*  PLT 742* 882* 844* 759*  MCV 94.2 93.8 91.5 93.6  MCH 31.0 30.5 30.2 30.6  MCHC 32.9 32.5 33.0 32.7  RDW 13.4 13.6 13.8 13.7    Recent Labs  Lab 11/26/22 0813 11/26/22 1059 11/27/22 0525 11/28/22 1403 11/29/22 0239  NA 133*  --  138 136 136  K 4.3  --  3.2* 3.1* 3.4*  CL 107  --  110 108 109  CO2 21*  --  18* 21* 19*  ANIONGAP 5  --  10 7 8   GLUCOSE 298*  --  223* 70 80  BUN 26*  --  24* 16 15  CREATININE 0.74  --  0.81 0.92 0.94  AST 25  --  21 25 21   ALT 31  --  27 22 22   ALKPHOS 492*  --  462* 419* 376*  BILITOT 0.5  --  0.9 0.4 0.5  ALBUMIN <1.5*  --  1.6* <1.5* <1.5*  CRP  --   --   --  1.3* 0.8  PROCALCITON  --   --   --  <0.10 <0.10  LATICACIDVEN  --  1.0  --   --   --   INR  --   --   --  1.1  --   BNP  --   --   --  1,773.1* 2,042.2*  MG  --   --   --  1.6* 1.5*  CALCIUM 8.1*  --  8.7* 8.5* 8.0*      Recent Labs  Lab 11/26/22 0813 11/26/22 1059 11/27/22 0525 11/28/22 1403 11/29/22 0239  CRP  --   --   --  1.3* 0.8  PROCALCITON  --   --   --  <0.10 <0.10  LATICACIDVEN  --  1.0  --   --   --   INR  --   --   --  1.1  --   BNP  --   --   --  1,773.1* 2,042.2*  MG  --   --   --  1.6* 1.5*  CALCIUM 8.1*  --  8.7* 8.5* 8.0*     Radiology Reports CT ABDOMEN PELVIS WO CONTRAST  Result Date: 11/28/2022 CLINICAL DATA:  Acute generalized abdominal pain status post bilateral amputation. EXAM: CT ABDOMEN AND PELVIS WITHOUT CONTRAST TECHNIQUE: Multidetector CT imaging of the abdomen and pelvis was performed following the standard protocol without IV contrast. RADIATION DOSE REDUCTION: This exam was performed according to the departmental dose-optimization program which includes automated exposure control, adjustment of the mA and/or kV according to patient size and/or use of iterative reconstruction technique. COMPARISON:  November 26, 2022. FINDINGS: Lower chest: No acute abnormality. Hepatobiliary: No focal liver  abnormality is seen. No gallstones, gallbladder wall thickening, or biliary dilatation. Pancreas: Unremarkable. No pancreatic ductal dilatation or surrounding inflammatory changes. Spleen: Normal in size without focal abnormality. Adrenals/Urinary Tract: Adrenal glands are unremarkable. Kidneys are normal, without renal calculi, focal lesion, or hydronephrosis. Bladder is unremarkable. Stomach/Bowel: Stomach is unremarkable. The appendix appears  normal. There is no evidence of bowel obstruction. There remains mild wall thickening of descending and sigmoid colon which may represent lack of distension, but infectious or inflammatory colitis cannot be excluded. Vascular/Lymphatic: No definite vascular abnormality. No significantly enlarged adenopathy is noted. Reproductive: Uterus and bilateral adnexa are unremarkable. Other: No ascites or hernia is noted.  Mild anasarca is noted. Musculoskeletal: No acute or significant osseous findings. IMPRESSION: There remains mild wall thickening of descending and sigmoid colon which may represent lack of distension, but infectious or inflammatory colitis cannot be excluded. Probable mild anasarca. Electronically Signed   By: Lupita Raider M.D.   On: 11/28/2022 12:43   CT EXTREM LOWER W CM BIL  Result Date: 11/26/2022 CLINICAL DATA:  Soft tissue infection suspected, lower leg. X-ray done. Status post bilateral above the knee amputation 1 week ago. Patient has been vomiting all morning. EXAM: CT OF THE LOWER BILATERAL EXTREMITY WITH CONTRAST TECHNIQUE: Multidetector CT imaging of the lower bilateral extremity was performed according to the standard protocol following intravenous contrast administration. RADIATION DOSE REDUCTION: This exam was performed according to the departmental dose-optimization program which includes automated exposure control, adjustment of the mA and/or kV according to patient size and/or use of iterative reconstruction technique. COMPARISON:  CT right  tibia and fibula and right femur 11/01/2022, CT left tibia and fibula and left femur 11/01/2022 CONTRAST:  OMNIPAQUE IOHEXOL 300 MG/ML  SOLN FINDINGS: Bones/Joint/Cartilage Mild bilateral superolateral acetabular degenerative osteophytosis. Minimal superior pubic symphysis joint space narrowing. Interval postsurgical changes of bilateral above-the-knee amputation. Starting at the superior aspect of the femoral head, the right femur measures up to approximately 30.8 cm in the left femur measures up to approximately 30.3 cm. The distal femoral diaphyseal amputation margins are sharp. There are surgical skin staples at the distal aspect of each soft tissue distal amputation stump. Right lower extremity: There is a low-density collection just distal to the amputation stump and extending up the distal lateral greater than medial thigh (axial series 2 images 58 through 73 and axial series 5 images 1 through 17). This collection is of moderate density and contains moderate scattered air. There is mild peripheral rim enhancement. This is suspicious for an abscess, and appears to extend to the prior surgical incision at the distal stump, possibly draining to the skin surface (coronal series 7, images 43-45). The irregular size of this collection makes measurements difficult, however it appears to measure up to approximately 11 cm in transverse dimension. Extends up the lateral thigh approximately 8.5 cm (as measured on coronal series 3, image 62) and the medial thigh up to approximately 4.6 cm. No cortical erosion to indicate acute osteomyelitis. Left lower extremity: Simple are to the contralateral side, there is a low-density collection just distal to the amputation stump extending up the distal lateral greater than medial thigh (axial series 2 images 47 through 68 and axial series 5 images 1 through 12). This collection is similar moderate density and contains more mild scattered air compared to the contralateral  leg. There is mild peripheral rim enhancement. A thin tract is seen from this fluid draining to the distal anterior amputation stump surgical incision at the skin surface (coronal series 3 images 42 through 45, axial series 5 images 13 through 15). This irregular collection measures up to approximately 10 cm in greatest transverse dimension. It extends up the lateral thigh approximately 8 cm and up the medial thigh approximately 5.5 cm (as measured on coronal series 3 images 57 and  49, respectively). Ligaments Suboptimally assessed by CT. Muscles and Tendons There is heterogeneous partially rim enhancing low-density within the medial aspect of the distal right sartorius muscle that again extends to the medial skin surface (axial series 2, images 52 through 66). The lowest density, rim enhancing portion measures up to 1.5 x 1.2 x 6.0 cm (transverse by AP by craniocaudal, axial series 2, image 54 and coronal series 3, image 54), decreased from prior. This again may extend and drained to the distal medial thigh surgical incision site (axial series 2 images 57 through 64). There is fluid tracking up the lateral right thigh in between the lateral subcutaneous fat in the vastus lateralis muscle, overall measuring up to approximately 3 x 9 x 19 cm (transverse by AP by craniocaudal, axial series 2, image 47 and coronal series 3, image 63). There is similar fluid tracking along the lateral left thigh in between the deep aspect of the subcutaneous fat in the vastus lateralis muscle, measuring up to approximately 2 cm in transverse dimension and 18 cm in craniocaudal dimension. This is seen on the coronal images but is off the lateral plane of view of the axial series 2. There is interval decrease in now minimal complex fluid anteromedial to the sartorius and vastus medialis muscles that minimally extends through the distal anteromedial thigh skin surface (axial series 2, images 44 through 66). Soft tissues There is diffuse  moderate subcutaneous fat edema and swelling. Fluid distends the rectum, and there is moderate rectal wall thickening as seen on today's CT abdomen and pelvis. IMPRESSION: Compared to 11/01/2022: 1. Interval postsurgical changes of revised, now bilateral above-the-knee amputation. 2. There are bilateral low-density collections just distal to the amputation stumps extending up the distal lateral greater than medial thigh. These collections contain moderate scattered air and are suspicious for abscesses. These collections appear to extend to the prior surgical incision at the distal stump, possibly draining to the skin surface. 3. There is complex fluid tracking between the lateral thigh subcutaneous fat and the vastus lateralis muscles bilaterally, as above. 4. Interval decrease in size of small complex fluid collections superficial to the anteromedial aspect of the right sartorius muscle and the junction of the left sartorius and vastus medialis muscles, likely minimally draining to the anteromedial thigh skin surface. Electronically Signed   By: Neita Garnet M.D.   On: 11/26/2022 14:56   DG Chest Portable 1 View  Result Date: 11/26/2022 CLINICAL DATA:  Tachycardia, vomiting, diaphoretic EXAM: PORTABLE CHEST 1 VIEW COMPARISON:  10/08/2022 FINDINGS: Diffuse symmetric and bilateral interstitial opacities favored to be interstitial edema/early CHF. Mild cardiac enlargement. No large effusion or pneumothorax. Trachea midline. Monitor leads overlie the chest. IMPRESSION: Cardiomegaly with diffuse interstitial edema pattern. Electronically Signed   By: Judie Petit.  Shick M.D.   On: 11/26/2022 14:54   CT ABDOMEN PELVIS W CONTRAST  Result Date: 11/26/2022 CLINICAL DATA:  Vomiting and generalized abdominal pain 1 week after bilateral above knee amputation. EXAM: CT ABDOMEN AND PELVIS WITH CONTRAST TECHNIQUE: Multidetector CT imaging of the abdomen and pelvis was performed using the standard protocol following bolus  administration of intravenous contrast. RADIATION DOSE REDUCTION: This exam was performed according to the departmental dose-optimization program which includes automated exposure control, adjustment of the mA and/or kV according to patient size and/or use of iterative reconstruction technique. CONTRAST:  OMNIPAQUE IOHEXOL 300 MG/ML  SOLN COMPARISON:  April 05, 2022. FINDINGS: Lower chest: Minimal bibasilar subsegmental atelectasis or edema is noted. Hepatobiliary: No focal liver  abnormality is seen. No gallstones, gallbladder wall thickening, or biliary dilatation. Pancreas: Unremarkable. No pancreatic ductal dilatation or surrounding inflammatory changes. Spleen: Normal in size without focal abnormality. Adrenals/Urinary Tract: Adrenal glands are unremarkable. Kidneys are normal, without renal calculi, focal lesion, or hydronephrosis. Bladder is unremarkable. Stomach/Bowel: The stomach is unremarkable. The appendix appears normal. There is no evidence of bowel obstruction. Moderate wall thickening of the colon and rectum is noted most consistent with infectious or inflammatory colitis. Vascular/Lymphatic: No significant vascular findings are present. No enlarged abdominal or pelvic lymph nodes. Reproductive: Prostate is unremarkable. Other: No ascites or hernia is noted.  Mild anasarca is noted. Musculoskeletal: No acute or significant osseous findings. IMPRESSION: Moderate diffuse colonic wall thickening is noted suggesting infectious or inflammatory colitis. Minimal bibasilar subsegmental atelectasis or edema is noted. Electronically Signed   By: Lupita Raider M.D.   On: 11/26/2022 14:07      Signature  -   Susa Raring M.D on 11/29/2022 at 9:21 AM   -  To page go to [email or web address]

## 2022-11-29 NOTE — Plan of Care (Signed)
  Problem: Education: Goal: Knowledge of the prescribed therapeutic regimen will improve Outcome: Progressing Goal: Ability to verbalize activity precautions or restrictions will improve Outcome: Progressing Goal: Understanding of discharge needs will improve Outcome: Progressing   Problem: Activity: Goal: Ability to perform//tolerate increased activity and mobilize with assistive devices will improve Outcome: Progressing   Problem: Clinical Measurements: Goal: Postoperative complications will be avoided or minimized Outcome: Progressing   Problem: Self-Care: Goal: Ability to meet self-care needs will improve Outcome: Progressing   Problem: Self-Concept: Goal: Ability to maintain and perform role responsibilities to the fullest extent possible will improve Outcome: Progressing   Problem: Pain Management: Goal: Pain level will decrease with appropriate interventions Outcome: Progressing   Problem: Skin Integrity: Goal: Demonstration of wound healing without infection will improve Outcome: Progressing   Problem: Activity: Goal: Ability to tolerate increased activity will improve Outcome: Progressing   Problem: Respiratory: Goal: Ability to maintain a clear airway and adequate ventilation will improve Outcome: Progressing   Problem: Role Relationship: Goal: Method of communication will improve Outcome: Progressing   Problem: Education: Goal: Knowledge of General Education information will improve Description: Including pain rating scale, medication(s)/side effects and non-pharmacologic comfort measures Outcome: Progressing   Problem: Health Behavior/Discharge Planning: Goal: Ability to manage health-related needs will improve Outcome: Progressing   Problem: Clinical Measurements: Goal: Ability to maintain clinical measurements within normal limits will improve Outcome: Progressing Goal: Will remain free from infection Outcome: Progressing Goal: Diagnostic test  results will improve Outcome: Progressing Goal: Respiratory complications will improve Outcome: Progressing Goal: Cardiovascular complication will be avoided Outcome: Progressing   Problem: Activity: Goal: Risk for activity intolerance will decrease Outcome: Progressing   Problem: Nutrition: Goal: Adequate nutrition will be maintained Outcome: Progressing   Problem: Coping: Goal: Level of anxiety will decrease Outcome: Progressing   Problem: Elimination: Goal: Will not experience complications related to bowel motility Outcome: Progressing Goal: Will not experience complications related to urinary retention Outcome: Progressing   Problem: Pain Managment: Goal: General experience of comfort will improve Outcome: Progressing   Problem: Safety: Goal: Ability to remain free from injury will improve Outcome: Progressing   Problem: Skin Integrity: Goal: Risk for impaired skin integrity will decrease Outcome: Progressing   Problem: Education: Goal: Ability to describe self-care measures that may prevent or decrease complications (Diabetes Survival Skills Education) will improve Outcome: Progressing Goal: Individualized Educational Video(s) Outcome: Progressing   Problem: Coping: Goal: Ability to adjust to condition or change in health will improve Outcome: Progressing   Problem: Fluid Volume: Goal: Ability to maintain a balanced intake and output will improve Outcome: Progressing   Problem: Health Behavior/Discharge Planning: Goal: Ability to identify and utilize available resources and services will improve Outcome: Progressing Goal: Ability to manage health-related needs will improve Outcome: Progressing   Problem: Metabolic: Goal: Ability to maintain appropriate glucose levels will improve Outcome: Progressing   Problem: Nutritional: Goal: Maintenance of adequate nutrition will improve Outcome: Progressing Goal: Progress toward achieving an optimal weight  will improve Outcome: Progressing   Problem: Skin Integrity: Goal: Risk for impaired skin integrity will decrease Outcome: Progressing   Problem: Tissue Perfusion: Goal: Adequacy of tissue perfusion will improve Outcome: Progressing

## 2022-11-29 NOTE — Progress Notes (Signed)
Ms. Venti is alert and oriented x4. Cleansed bilateral AKA with normal saline, pat dry with gauze, placed non adherent dressings to bilateral AKA with surgical tape and wrapped with one layer of kerlix. Pt was able to tolerate placing shrinkers with no issues.

## 2022-11-30 DIAGNOSIS — R112 Nausea with vomiting, unspecified: Secondary | ICD-10-CM | POA: Diagnosis not present

## 2022-11-30 LAB — COMPREHENSIVE METABOLIC PANEL
ALT: 17 U/L (ref 0–44)
AST: 18 U/L (ref 15–41)
Albumin: <1.5 g/dL — ABNORMAL LOW (ref 3.5–5.0)
Alkaline Phosphatase: 358 U/L — ABNORMAL HIGH (ref 38–126)
Anion gap: 7 (ref 5–15)
BUN: 14 mg/dL (ref 6–20)
CO2: 20 mmol/L — ABNORMAL LOW (ref 22–32)
Calcium: 7.8 mg/dL — ABNORMAL LOW (ref 8.9–10.3)
Chloride: 104 mmol/L (ref 98–111)
Creatinine, Ser: 0.91 mg/dL (ref 0.44–1.00)
GFR, Estimated: >60 mL/min (ref >60)
Glucose, Bld: 145 mg/dL — ABNORMAL HIGH (ref 70–99)
Potassium: 3.8 mmol/L (ref 3.5–5.1)
Sodium: 131 mmol/L — ABNORMAL LOW (ref 135–145)
Total Bilirubin: 0.3 mg/dL (ref 0.3–1.2)
Total Protein: 6.1 g/dL — ABNORMAL LOW (ref 6.5–8.1)

## 2022-11-30 LAB — CBC
HCT: 22.8 % — ABNORMAL LOW (ref 36.0–46.0)
Hemoglobin: 7.3 g/dL — ABNORMAL LOW (ref 12.0–15.0)
MCH: 29.9 pg (ref 26.0–34.0)
MCHC: 32 g/dL (ref 30.0–36.0)
MCV: 93.4 fL (ref 80.0–100.0)
Platelets: 790 10*3/uL — ABNORMAL HIGH (ref 150–400)
RBC: 2.44 MIL/uL — ABNORMAL LOW (ref 3.87–5.11)
RDW: 13.5 % (ref 11.5–15.5)
WBC: 9 10*3/uL (ref 4.0–10.5)
nRBC: 0 % (ref 0.0–0.2)

## 2022-11-30 LAB — GLUCOSE, CAPILLARY
Glucose-Capillary: 129 mg/dL — ABNORMAL HIGH (ref 70–99)
Glucose-Capillary: 108 mg/dL — ABNORMAL HIGH (ref 70–99)
Glucose-Capillary: 130 mg/dL — ABNORMAL HIGH (ref 70–99)

## 2022-11-30 LAB — BRAIN NATRIURETIC PEPTIDE: B Natriuretic Peptide: 1098 pg/mL — ABNORMAL HIGH (ref 0.0–100.0)

## 2022-11-30 LAB — MAGNESIUM: Magnesium: 2.1 mg/dL (ref 1.7–2.4)

## 2022-11-30 LAB — C-REACTIVE PROTEIN: CRP: 0.5 mg/dL (ref <1.0)

## 2022-11-30 LAB — PROCALCITONIN: Procalcitonin: <0.1 ng/mL

## 2022-11-30 MED ORDER — HYDROMORPHONE HCL 1 MG/ML IJ SOLN
1.0000 mg | Freq: Three times a day (TID) | INTRAMUSCULAR | Status: DC | PRN
  Administered 2022-11-30 – 2022-12-03 (×9): 1 mg via INTRAVENOUS
  Filled 2022-11-30 (×9): qty 1

## 2022-11-30 NOTE — Plan of Care (Signed)
  Problem: Education: Goal: Knowledge of the prescribed therapeutic regimen will improve 11/30/2022 0603 by Dorathy Daft, RN Outcome: Progressing 11/30/2022 0603 by Dorathy Daft, RN Outcome: Progressing Goal: Ability to verbalize activity precautions or restrictions will improve 11/30/2022 0603 by Dorathy Daft, RN Outcome: Progressing 11/30/2022 0603 by Dorathy Daft, RN Outcome: Progressing Goal: Understanding of discharge needs will improve 11/30/2022 0603 by Dorathy Daft, RN Outcome: Progressing 11/30/2022 0603 by Dorathy Daft, RN Outcome: Progressing   Problem: Clinical Measurements: Goal: Postoperative complications will be avoided or minimized 11/30/2022 0603 by Dorathy Daft, RN Outcome: Progressing 11/30/2022 0603 by Dorathy Daft, RN Outcome: Progressing   Problem: Pain Management: Goal: Pain level will decrease with appropriate interventions 11/30/2022 0603 by Dorathy Daft, RN Outcome: Progressing 11/30/2022 0603 by Dorathy Daft, RN Outcome: Progressing   Problem: Clinical Measurements: Goal: Ability to maintain clinical measurements within normal limits will improve 11/30/2022 0603 by Dorathy Daft, RN Outcome: Progressing 11/30/2022 0603 by Dorathy Daft, RN Outcome: Progressing Goal: Will remain free from infection 11/30/2022 0603 by Dorathy Daft, RN Outcome: Progressing 11/30/2022 0603 by Dorathy Daft, RN Outcome: Progressing Goal: Diagnostic test results will improve 11/30/2022 0603 by Dorathy Daft, RN Outcome: Progressing 11/30/2022 0603 by Dorathy Daft, RN Outcome: Progressing Goal: Respiratory complications will improve 11/30/2022 0603 by Dorathy Daft, RN Outcome: Progressing 11/30/2022 0603 by Dorathy Daft, RN Outcome: Progressing Goal: Cardiovascular complication will be avoided 11/30/2022 0603 by Dorathy Daft, RN Outcome: Progressing 11/30/2022 0603 by Dorathy Daft, RN Outcome:  Progressing

## 2022-11-30 NOTE — Progress Notes (Addendum)
PROGRESS NOTE                                                                                                                                                                                                             Patient Demographics:    Kelli Hudson, is a 32 y.o. female, DOB - Dec 27, 1990, JYN:[id number]  Outpatient Primary MD for the patient is Norm Salt, [place]    LOS - 4  Admit date - 11/26/2022    Chief Complaint  Patient presents with   Emesis       Brief Narrative (HPI from H&P)   32 y.o. 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  Was discharged to SNF.  Now being admitted from SNF for nausea vomiting some ongoing lower extremity stump pain.   Subjective:   Patient in bed, appears comfortable, denies any headache, no fever, no chest pain or pressure, no shortness of breath , no abdominal pain. No focal weakness.   Assessment  & Plan :   Possible sepsis present on admission s/p bilateral AKA with possible infection at the stump site more likely on the right than left.  CTA images reviewed, seen by Dr. Lajoyce Corners, case discussed with ID physician Dr. Algis Liming.  Orthopedics is of the opinion that this is postop lymphatic fluid collection, ID does not think patient has infection and has discontinued all antibiotics.  Monitor inflammatory markers and clinically.  Acute on chronic nausea vomiting due to gastroparesis, initially had some diarrhea but no bowel movements in the last 2 days.  Abdominal exam benign.  Supportive care, scheduled Reglan, as needed antiemetics, bowel rest, IV fluids, repeat CT abdomen pelvis to rule out ileus, case discussed with Dr. Algis Liming ID  hold off all antibiotics for now.  Clinically low likelihood of C. difficile.  Repeat CT scan improved with nonspecific changes, with supportive care nausea vomiting much improved on 11/29/2022 continue to monitor.  HX of narcotic seeking behavior.  Monitor.    GERD.  Supportive care for now.  Hypokalemia and hypomagnesemia.  Replaced.   Hypertension.  Blood pressure medications adjusted.  Diabetic peripheral neuropathy.  On Neurontin.  For memory per patient  and family members.  Recent staples over the stumps hence no MRI, stable/non acute CT head, B12, TSH, RPR and HIV screen, remains AAO x 3 here, postdischarge outpatient neurology follow-up.  DM type I.  Not on DKA.  Low-dose Semglee and sliding scale.  Monitor  Lab Results  Component Value Date   HGBA1C 5.9 (H) 11/18/2022   CBG (last 3)  Recent Labs    11/29/22 1756 11/29/22 2136 11/30/22 0834  GLUCAP 119* 157* 129*        Condition - Extremely Guarded  Family Communication  : Mother updated over the phone [phone] - 11/29/2022, message left morning of 11/30/2022 at 8:45 PM  Code Status :  Full  Consults  :  Ortho, ID  PUD Prophylaxis :    Procedures  :     CT head 11/29/2022.  Nonacute.    Repeat CT scan 11/28/2022.   There remains mild wall thickening of descending and sigmoid colon which may represent lack of distension, but infectious or inflammatory colitis cannot be excluded. Probable mild anasarca  CT -  11/26/22: 1. Interval postsurgical changes of revised, now bilateral above-the-knee amputation. 2. There are bilateral low-density collections just distal to the amputation stumps extending up the distal lateral greater than medial thigh. These collections contain moderate scattered air and are suspicious for abscesses. These collections appear to extend to the prior surgical incision at the distal stump, possibly draining to the skin surface. 3. There is complex fluid tracking between the lateral thigh  subcutaneous fat and the vastus lateralis muscles bilaterally, as above. 4. Interval decrease in size of small complex fluid collections superficial to the anteromedial aspect of the right sartorius muscle and the junction of the left sartorius and vastus medialis muscles, likely minimally draining to the anteromedial thigh skin surface.      Disposition Plan  :    Status is: Inpatient  DVT Prophylaxis  :    enoxaparin (LOVENOX) injection 40 mg Start: 11/26/22 2200   Lab Results  Component Value Date   PLT 759 (H) 11/29/2022    Diet :  Diet Order             Diet heart healthy/carb modified Room service appropriate? Yes; Fluid consistency: Thin  Diet effective now                    Inpatient Medications  Scheduled Meds:  ascorbic acid  1,000 mg Oral Daily   carvedilol  6.25 mg Oral BID WC   enoxaparin (LOVENOX) injection  40 mg Subcutaneous Q24H   fentaNYL  1 patch Transdermal Q72H   gabapentin  300 mg Oral Q8H   insulin aspart  0-9 Units Subcutaneous TID WC   insulin detemir  4 Units Subcutaneous Daily   metoCLOPramide (REGLAN) injection  10 mg Intravenous Q8H   sucralfate  1 g Oral TID WC & HS   vancomycin  125 mg Oral QID   zinc sulfate  220 mg Oral Daily   Continuous Infusions:   PRN Meds:.acetaminophen **OR** acetaminophen, hydrALAZINE, HYDROmorphone (DILAUDID) injection, ketorolac, ondansetron (ZOFRAN) IV, mouth rinse   Objective:   Vitals:   11/29/22 2000 11/29/22 2118 11/30/22 0010 11/30/22 0836  BP:  115/76 121/85   Pulse: (!) 117 (!) 101    Resp: 10 16    Temp:   98.4 F (36.9 C)   TempSrc:   Oral Oral  SpO2: 98% 98%    Weight:  Wt Readings from Last 3 Encounters:  11/26/22 75 kg  06/18/22 83.5 kg  06/16/22 83.9 kg    No intake or output data in the 24 hours ending 11/30/22 0845    Physical Exam  Awake Alert, No new F.N deficits, Normal affect [place].AT,PERRAL Supple Neck, No JVD,   Symmetrical Chest wall movement, Good air  movement bilaterally, CTAB RRR,No Gallops,Rubs or new Murmurs,  +ve B.Sounds, Abd Soft, No tenderness,   Bilateral BKA, right BKA stump under bandage    Data Review:    Recent Labs  Lab 11/26/22 0813 11/27/22 0525 11/28/22 1403 11/29/22 0239  WBC 17.9* 21.0* 15.0* 12.3*  HGB 8.5* 9.9* 7.5* 7.2*  HCT 25.8* 30.5* 22.7* 22.0*  PLT 742* 882* 844* 759*  MCV 94.2 93.8 91.5 93.6  MCH 31.0 30.5 30.2 30.6  MCHC 32.9 32.5 33.0 32.7  RDW 13.4 13.6 13.8 13.7    Recent Labs  Lab 11/26/22 0813 11/26/22 1059 11/27/22 0525 11/28/22 1403 11/29/22 0239 11/29/22 1013  NA 133*  --  138 136 136  --   K 4.3  --  3.2* 3.1* 3.4*  --   CL 107  --  110 108 109  --   CO2 21*  --  18* 21* 19*  --   ANIONGAP 5  --  10 7 8   --   GLUCOSE 298*  --  223* 70 80  --   BUN 26*  --  24* 16 15  --   CREATININE 0.74  --  0.81 0.92 0.94  --   AST 25  --  21 25 21   --   ALT 31  --  27 22 22   --   ALKPHOS 492*  --  462* 419* 376*  --   BILITOT 0.5  --  0.9 0.4 0.5  --   ALBUMIN <1.5*  --  1.6* <1.5* <1.5*  --   CRP  --   --   --  1.3* 0.8  --   PROCALCITON  --   --   --  <0.10 <0.10  --   LATICACIDVEN  --  1.0  --   --   --   --   INR  --   --   --  1.1  --   --   TSH  --   --   --   --   --  1.401  BNP  --   --   --  1,773.1* 2,042.2*  --   MG  --   --   --  1.6* 1.5*  --   CALCIUM 8.1*  --  8.7* 8.5* 8.0*  --       Recent Labs  Lab 11/26/22 0813 11/26/22 1059 11/27/22 0525 11/28/22 1403 11/29/22 0239 11/29/22 1013  CRP  --   --   --  1.3* 0.8  --   PROCALCITON  --   --   --  <0.10 <0.10  --   LATICACIDVEN  --  1.0  --   --   --   --   INR  --   --   --  1.1  --   --   TSH  --   --   --   --   --  1.401  BNP  --   --   --  1,773.1* 2,042.2*  --   MG  --   --   --  1.6* 1.5*  --   CALCIUM 8.1*  --  8.7* 8.5* 8.0*  --      Radiology Reports CT HEAD WO CONTRAST ( )  Result Date: 11/29/2022 CLINICAL DATA:  32 year old female with complicated diabetes. Memory loss. EXAM: CT HEAD  WITHOUT CONTRAST TECHNIQUE: Contiguous axial images were obtained from the base of the skull through the vertex without intravenous contrast. RADIATION DOSE REDUCTION: This exam was performed according to the departmental dose-optimization program which includes automated exposure control, adjustment of the mA and/or kV according to patient size and/or use of iterative reconstruction technique. COMPARISON:  Head CT 11/06/2021. FINDINGS: Brain: Cerebral volume remains within normal limits. No midline shift, ventriculomegaly, mass effect, evidence of mass lesion, intracranial hemorrhage or evidence of cortically based acute infarction. Gray-white matter differentiation is within normal limits throughout the brain. Vascular: No suspicious intracranial vascular hyperdensity. Skull: Stable, negative. Sinuses/Orbits: Visualized paranasal sinuses and mastoids are clear. Other: Postoperative changes to the right globe since last year. No acute orbit or scalp soft tissue finding. IMPRESSION: 1. Stable and normal noncontrast CT appearance of the brain. 2. Postoperative changes to the right globe since last year. Electronically Signed   By: Odessa Fleming M.D.   On: 11/29/2022 13:40   CT ABDOMEN PELVIS WO CONTRAST  Result Date: 11/28/2022 CLINICAL DATA:  Acute generalized abdominal pain status post bilateral amputation. EXAM: CT ABDOMEN AND PELVIS WITHOUT CONTRAST TECHNIQUE: Multidetector CT imaging of the abdomen and pelvis was performed following the standard protocol without IV contrast. RADIATION DOSE REDUCTION: This exam was performed according to the departmental dose-optimization program which includes automated exposure control, adjustment of the mA and/or kV according to patient size and/or use of iterative reconstruction technique. COMPARISON:  November 26, 2022. FINDINGS: Lower chest: No acute abnormality. Hepatobiliary: No focal liver abnormality is seen. No gallstones, gallbladder wall thickening, or biliary  dilatation. Pancreas: Unremarkable. No pancreatic ductal dilatation or surrounding inflammatory changes. Spleen: Normal in size without focal abnormality. Adrenals/Urinary Tract: Adrenal glands are unremarkable. Kidneys are normal, without renal calculi, focal lesion, or hydronephrosis. Bladder is unremarkable. Stomach/Bowel: Stomach is unremarkable. The appendix appears normal. There is no evidence of bowel obstruction. There remains mild wall thickening of descending and sigmoid colon which may represent lack of distension, but infectious or inflammatory colitis cannot be excluded. Vascular/Lymphatic: No definite vascular abnormality. No significantly enlarged adenopathy is noted. Reproductive: Uterus and bilateral adnexa are unremarkable. Other: No ascites or hernia is noted.  Mild anasarca is noted. Musculoskeletal: No acute or significant osseous findings. IMPRESSION: There remains mild wall thickening of descending and sigmoid colon which may represent lack of distension, but infectious or inflammatory colitis cannot be excluded. Probable mild anasarca. Electronically Signed   By: Lupita Raider M.D.   On: 11/28/2022 12:43   CT EXTREM LOWER W CM BIL  Result Date: 11/26/2022 CLINICAL DATA:  Soft tissue infection suspected, lower leg. X-ray done. Status post bilateral above the knee amputation 1 week ago. Patient has been vomiting all morning. EXAM: CT OF THE LOWER BILATERAL EXTREMITY WITH CONTRAST TECHNIQUE: Multidetector CT imaging of the lower bilateral extremity was performed according to the standard protocol following intravenous contrast administration. RADIATION DOSE REDUCTION: This exam was performed according to the departmental dose-optimization program which includes automated exposure control, adjustment of the mA and/or kV according to patient size and/or use of iterative reconstruction technique. COMPARISON:  CT right tibia and fibula and right femur 11/01/2022, CT left tibia and fibula and  left femur 11/01/2022 CONTRAST:  OMNIPAQUE IOHEXOL 300 MG/ML  SOLN FINDINGS: Bones/Joint/Cartilage Mild bilateral  superolateral acetabular degenerative osteophytosis. Minimal superior pubic symphysis joint space narrowing. Interval postsurgical changes of bilateral above-the-knee amputation. Starting at the superior aspect of the femoral head, the right femur measures up to approximately 30.8 cm in the left femur measures up to approximately 30.3 cm. The distal femoral diaphyseal amputation margins are sharp. There are surgical skin staples at the distal aspect of each soft tissue distal amputation stump. Right lower extremity: There is a low-density collection just distal to the amputation stump and extending up the distal lateral greater than medial thigh (axial series 2 images 58 through 73 and axial series 5 images 1 through 17). This collection is of moderate density and contains moderate scattered air. There is mild peripheral rim enhancement. This is suspicious for an abscess, and appears to extend to the prior surgical incision at the distal stump, possibly draining to the skin surface (coronal series 7, images 43-45). The irregular size of this collection makes measurements difficult, however it appears to measure up to approximately 11 cm in transverse dimension. Extends up the lateral thigh approximately 8.5 cm (as measured on coronal series 3, image 62) and the medial thigh up to approximately 4.6 cm. No cortical erosion to indicate acute osteomyelitis. Left lower extremity: Simple are to the contralateral side, there is a low-density collection just distal to the amputation stump extending up the distal lateral greater than medial thigh (axial series 2 images 47 through 68 and axial series 5 images 1 through 12). This collection is similar moderate density and contains more mild scattered air compared to the contralateral leg. There is mild peripheral rim enhancement. A thin tract is seen from  this fluid draining to the distal anterior amputation stump surgical incision at the skin surface (coronal series 3 images 42 through 45, axial series 5 images 13 through 15). This irregular collection measures up to approximately 10 cm in greatest transverse dimension. It extends up the lateral thigh approximately 8 cm and up the medial thigh approximately 5.5 cm (as measured on coronal series 3 images 57 and 49, respectively). Ligaments Suboptimally assessed by CT. Muscles and Tendons There is heterogeneous partially rim enhancing low-density within the medial aspect of the distal right sartorius muscle that again extends to the medial skin surface (axial series 2, images 52 through 66). The lowest density, rim enhancing portion measures up to 1.5 x 1.2 x 6.0 cm (transverse by AP by craniocaudal, axial series 2, image 54 and coronal series 3, image 54), decreased from prior. This again may extend and drained to the distal medial thigh surgical incision site (axial series 2 images 57 through 64). There is fluid tracking up the lateral right thigh in between the lateral subcutaneous fat in the vastus lateralis muscle, overall measuring up to approximately 3 x 9 x 19 cm (transverse by AP by craniocaudal, axial series 2, image 47 and coronal series 3, image 63). There is similar fluid tracking along the lateral left thigh in between the deep aspect of the subcutaneous fat in the vastus lateralis muscle, measuring up to approximately 2 cm in transverse dimension and 18 cm in craniocaudal dimension. This is seen on the coronal images but is off the lateral plane of view of the axial series 2. There is interval decrease in now minimal complex fluid anteromedial to the sartorius and vastus medialis muscles that minimally extends through the distal anteromedial thigh skin surface (axial series 2, images 44 through 66). Soft tissues There is diffuse moderate subcutaneous fat edema and swelling.  Fluid distends the rectum,  and there is moderate rectal wall thickening as seen on today's CT abdomen and pelvis. IMPRESSION: Compared to 11/01/2022: 1. Interval postsurgical changes of revised, now bilateral above-the-knee amputation. 2. There are bilateral low-density collections just distal to the amputation stumps extending up the distal lateral greater than medial thigh. These collections contain moderate scattered air and are suspicious for abscesses. These collections appear to extend to the prior surgical incision at the distal stump, possibly draining to the skin surface. 3. There is complex fluid tracking between the lateral thigh subcutaneous fat and the vastus lateralis muscles bilaterally, as above. 4. Interval decrease in size of small complex fluid collections superficial to the anteromedial aspect of the right sartorius muscle and the junction of the left sartorius and vastus medialis muscles, likely minimally draining to the anteromedial thigh skin surface. Electronically Signed   By: Neita Garnet M.D.   On: 11/26/2022 14:56   DG Chest Portable 1 View  Result Date: 11/26/2022 CLINICAL DATA:  Tachycardia, vomiting, diaphoretic EXAM: PORTABLE CHEST 1 VIEW COMPARISON:  10/08/2022 FINDINGS: Diffuse symmetric and bilateral interstitial opacities favored to be interstitial edema/early CHF. Mild cardiac enlargement. No large effusion or pneumothorax. Trachea midline. Monitor leads overlie the chest. IMPRESSION: Cardiomegaly with diffuse interstitial edema pattern. Electronically Signed   By: Judie Petit.  Shick M.D.   On: 11/26/2022 14:54   CT ABDOMEN PELVIS W CONTRAST  Result Date: 11/26/2022 CLINICAL DATA:  Vomiting and generalized abdominal pain 1 week after bilateral above knee amputation. EXAM: CT ABDOMEN AND PELVIS WITH CONTRAST TECHNIQUE: Multidetector CT imaging of the abdomen and pelvis was performed using the standard protocol following bolus administration of intravenous contrast. RADIATION DOSE REDUCTION: This exam was  performed according to the departmental dose-optimization program which includes automated exposure control, adjustment of the mA and/or kV according to patient size and/or use of iterative reconstruction technique. CONTRAST:  OMNIPAQUE IOHEXOL 300 MG/ML  SOLN COMPARISON:  April 05, 2022. FINDINGS: Lower chest: Minimal bibasilar subsegmental atelectasis or edema is noted. Hepatobiliary: No focal liver abnormality is seen. No gallstones, gallbladder wall thickening, or biliary dilatation. Pancreas: Unremarkable. No pancreatic ductal dilatation or surrounding inflammatory changes. Spleen: Normal in size without focal abnormality. Adrenals/Urinary Tract: Adrenal glands are unremarkable. Kidneys are normal, without renal calculi, focal lesion, or hydronephrosis. Bladder is unremarkable. Stomach/Bowel: The stomach is unremarkable. The appendix appears normal. There is no evidence of bowel obstruction. Moderate wall thickening of the colon and rectum is noted most consistent with infectious or inflammatory colitis. Vascular/Lymphatic: No significant vascular findings are present. No enlarged abdominal or pelvic lymph nodes. Reproductive: Prostate is unremarkable. Other: No ascites or hernia is noted.  Mild anasarca is noted. Musculoskeletal: No acute or significant osseous findings. IMPRESSION: Moderate diffuse colonic wall thickening is noted suggesting infectious or inflammatory colitis. Minimal bibasilar subsegmental atelectasis or edema is noted. Electronically Signed   By: Lupita Raider M.D.   On: 11/26/2022 14:07      Signature  -   Susa Raring M.D on 11/30/2022 at 8:45 AM   -  To page go to [email or web address]

## 2022-12-01 ENCOUNTER — Inpatient Hospital Stay (HOSPITAL_COMMUNITY): Payer: Medicaid Other

## 2022-12-01 ENCOUNTER — Encounter: Payer: Medicaid Other | Admitting: Orthopedic Surgery

## 2022-12-01 DIAGNOSIS — R112 Nausea with vomiting, unspecified: Secondary | ICD-10-CM | POA: Diagnosis not present

## 2022-12-01 DIAGNOSIS — Z515 Encounter for palliative care: Secondary | ICD-10-CM

## 2022-12-01 LAB — CBC
HCT: 20.1 % — ABNORMAL LOW (ref 36.0–46.0)
HCT: 29.8 % — ABNORMAL LOW (ref 36.0–46.0)
Hemoglobin: 6.4 g/dL — CL (ref 12.0–15.0)
Hemoglobin: 10.1 g/dL — ABNORMAL LOW (ref 12.0–15.0)
MCH: 30 pg (ref 26.0–34.0)
MCH: 30.2 pg (ref 26.0–34.0)
MCHC: 31.8 g/dL (ref 30.0–36.0)
MCHC: 33.9 g/dL (ref 30.0–36.0)
MCV: 94.4 fL (ref 80.0–100.0)
MCV: 89.2 fL (ref 80.0–100.0)
Platelets: 828 10*3/uL — ABNORMAL HIGH (ref 150–400)
Platelets: 651 10*3/uL — ABNORMAL HIGH (ref 150–400)
RBC: 2.13 MIL/uL — ABNORMAL LOW (ref 3.87–5.11)
RBC: 3.34 MIL/uL — ABNORMAL LOW (ref 3.87–5.11)
RDW: 13.6 % (ref 11.5–15.5)
RDW: 15.1 % (ref 11.5–15.5)
WBC: 10.9 10*3/uL — ABNORMAL HIGH (ref 4.0–10.5)
WBC: 10.7 10*3/uL — ABNORMAL HIGH (ref 4.0–10.5)
nRBC: 0 % (ref 0.0–0.2)
nRBC: 0 % (ref 0.0–0.2)

## 2022-12-01 LAB — CULTURE, BLOOD (ROUTINE X 2)
Culture: NO GROWTH
Special Requests: ADEQUATE

## 2022-12-01 LAB — BRAIN NATRIURETIC PEPTIDE: B Natriuretic Peptide: 1207.2 pg/mL — ABNORMAL HIGH (ref 0.0–100.0)

## 2022-12-01 LAB — C-REACTIVE PROTEIN: CRP: 0.6 mg/dL (ref <1.0)

## 2022-12-01 LAB — PROCALCITONIN: Procalcitonin: <0.1 ng/mL

## 2022-12-01 LAB — GLUCOSE, CAPILLARY
Glucose-Capillary: 70 mg/dL (ref 70–99)
Glucose-Capillary: 156 mg/dL — ABNORMAL HIGH (ref 70–99)
Glucose-Capillary: 99 mg/dL (ref 70–99)
Glucose-Capillary: 170 mg/dL — ABNORMAL HIGH (ref 70–99)
Glucose-Capillary: 130 mg/dL — ABNORMAL HIGH (ref 70–99)

## 2022-12-01 LAB — MAGNESIUM: Magnesium: 1.9 mg/dL (ref 1.7–2.4)

## 2022-12-01 LAB — CREATININE, SERUM
Creatinine, Ser: 0.83 mg/dL (ref 0.44–1.00)
GFR, Estimated: >60 mL/min (ref >60)

## 2022-12-01 LAB — PREPARE RBC (CROSSMATCH)

## 2022-12-01 MED ORDER — MORPHINE SULFATE (PF) 2 MG/ML IV SOLN
1.0000 mg | Freq: Once | INTRAVENOUS | Status: AC
  Administered 2022-12-01: 1 mg via INTRAVENOUS
  Filled 2022-12-01: qty 1

## 2022-12-01 MED ORDER — SODIUM CHLORIDE 0.9% IV SOLUTION: Freq: Once | INTRAVENOUS | Status: AC

## 2022-12-01 MED ORDER — ALUM & MAG HYDROXIDE-SIMETH 200-200-20 MG/5ML PO SUSP: 30.0000 mL | Freq: Four times a day (QID) | ORAL | Status: DC | PRN

## 2022-12-01 MED ORDER — FUROSEMIDE 10 MG/ML IJ SOLN
20.0000 mg | Freq: Once | INTRAMUSCULAR | Status: AC
  Administered 2022-12-01: 20 mg via INTRAVENOUS
  Filled 2022-12-01: qty 2

## 2022-12-01 MED ORDER — AMLODIPINE BESYLATE 10 MG PO TABS
10.0000 mg | ORAL_TABLET | Freq: Every day | ORAL | Status: DC
  Administered 2022-12-01 – 2022-12-03 (×3): 10 mg via ORAL
  Filled 2022-12-01 (×3): qty 1

## 2022-12-01 MED ORDER — FAMOTIDINE 20 MG PO TABS
20.0000 mg | ORAL_TABLET | Freq: Two times a day (BID) | ORAL | Status: DC
  Administered 2022-12-01 – 2022-12-03 (×5): 20 mg via ORAL
  Filled 2022-12-01 (×5): qty 1

## 2022-12-01 MED ORDER — PANTOPRAZOLE SODIUM 40 MG IV SOLR
40.0000 mg | Freq: Once | INTRAVENOUS | Status: AC
  Administered 2022-12-01: 40 mg via INTRAVENOUS
  Filled 2022-12-01: qty 10

## 2022-12-01 MED ORDER — JUVEN PO PACK
1.0000 | PACK | Freq: Two times a day (BID) | ORAL | Status: DC
  Administered 2022-12-01 – 2022-12-03 (×5): 1 via ORAL
  Filled 2022-12-01 (×5): qty 1

## 2022-12-01 NOTE — Progress Notes (Addendum)
PROGRESS NOTE                                                                                                                                                                                                             Patient Demographics:    Kelli Hudson, is a 32 y.o. female, DOB - [birth year], GUY:[id number]  Outpatient Primary MD for the patient is Norm Salt, [place]    LOS - 5  Admit date - 11/26/2022    Chief Complaint  Patient presents with   Emesis       Brief Narrative (HPI from H&P)   32 y.o. 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  Was discharged to SNF.  Now being admitted from SNF for nausea vomiting some ongoing lower extremity stump pain.   Subjective:   Patient in bed, appears comfortable, denies any headache, no fever, no chest pain or pressure, no shortness of breath , no abdominal pain. No focal weakness.   Assessment  & Plan :   Possible sepsis present on admission s/p bilateral AKA with possible infection at the stump site more likely on the right than left.  CTA images reviewed, seen by Dr. Lajoyce Corners, case discussed with ID physician Dr. Algis Liming.  Orthopedics is of the opinion that this is postop lymphatic fluid collection, ID does not think patient has infection and has discontinued all antibiotics.  Monitor inflammatory markers and clinically.  Acute on chronic nausea vomiting due to gastroparesis, initially had some diarrhea but no bowel movements in the last 2 days.  Abdominal exam benign.  Supportive care, scheduled Reglan, as needed antiemetics, bowel rest, IV fluids, repeat CT abdomen pelvis to rule out ileus, case discussed with Dr. Algis Liming ID  hold off all antibiotics for now except 10-day course of oral vancomycin.  Clinically low likelihood of C. difficile, stool studies could reflect colonization.  Repeat CT scan improved with nonspecific changes, but patient's diarrhea had completely resolved once she was admitted to the hospital and has had 1 formed bowel movement in 3 days.  Anemia of chronic disease with acute fall due to IV fluids for hydration and some right AKA stump site blood  loss.  Transfused 2 units of packed RBCs on 12/01/2022, hold Lovenox, Pepcid as avoiding PPI due to possible C. difficile colonization, no signs of active bleeding, continue to monitor CBC.  Orthopedics to monitor the right stump site.  HX of narcotic seeking behavior.  Monitor.    GERD.  Supportive care for now.  Hypokalemia and hypomagnesemia.  Replaced.   Hypertension.  On Coreg, add Norvasc and monitor.  Diabetic peripheral neuropathy.  On Neurontin.  For memory per patient and family members.  Recent staples over the stumps hence no MRI, stable/non acute CT head, B12, TSH, RPR and HIV screen, remains AAO x 3 here, postdischarge outpatient neurology follow-up.  DM type I.  Not on DKA.  Low-dose Semglee and sliding scale.  Monitor  Lab Results  Component Value Date   HGBA1C 5.9 (H) 11/18/2022   CBG (last 3)  Recent Labs    11/30/22 1604 11/30/22 2134 12/01/22 0828  GLUCAP 130* 70 156*        Condition - Extremely Guarded  Family Communication  : Mother updated over the phone [phone] - 11/29/2022, message left morning of 11/30/2022 at 8:45 PM, called 12/01/2022 at 9:20 AM  Code Status :  Full  Consults  :  Ortho, ID  PUD Prophylaxis :    Procedures  :     CT head 11/29/2022.  Nonacute.    Repeat CT scan 11/28/2022.   There remains mild wall thickening of descending and sigmoid colon which may represent lack of distension, but infectious or inflammatory colitis cannot be excluded. Probable mild anasarca  CT -   11/26/22: 1. Interval postsurgical changes of revised, now bilateral above-the-knee amputation. 2. There are bilateral low-density collections just distal to the amputation stumps extending up the distal lateral greater than medial thigh. These collections contain moderate scattered air and are suspicious for abscesses. These collections appear to extend to the prior surgical incision at the distal stump, possibly draining to the skin surface. 3. There is complex fluid tracking between the lateral thigh subcutaneous fat and the vastus lateralis muscles bilaterally, as above. 4. Interval decrease in size of small complex fluid collections superficial to the anteromedial aspect of the right sartorius muscle and the junction of the left sartorius and vastus medialis muscles, likely minimally draining to the anteromedial thigh skin surface.      Disposition Plan  :    Status is: Inpatient  DVT Prophylaxis  :    Place and maintain sequential compression device Start: 12/01/22 0915   Lab Results  Component Value Date   PLT 828 (H) 12/01/2022    Diet :  Diet Order             Diet heart healthy/carb modified Room service appropriate? Yes; Fluid consistency: Thin  Diet effective now                    Inpatient Medications  Scheduled Meds:  sodium chloride   Intravenous Once   amLODipine  10 mg Oral Daily   ascorbic acid  1,000 mg Oral Daily   carvedilol  6.25 mg Oral BID WC   famotidine  20 mg Oral BID   fentaNYL  1 patch Transdermal Q72H   furosemide  20 mg Intravenous Once   gabapentin  300 mg Oral Q8H   insulin aspart  0-9 Units Subcutaneous TID WC   insulin detemir  4 Units Subcutaneous Daily   metoCLOPramide (REGLAN) injection  10 mg Intravenous Q8H  sucralfate  1 g Oral TID WC & HS   vancomycin  125 mg Oral QID   zinc sulfate  220 mg Oral Daily   Continuous Infusions:   PRN Meds:.acetaminophen **OR** acetaminophen, hydrALAZINE, HYDROmorphone (DILAUDID) injection,  ketorolac, ondansetron (ZOFRAN) IV, mouth rinse   Objective:   Vitals:   12/01/22 0215 12/01/22 0330 12/01/22 0400 12/01/22 0830  BP:   (!) 134/93 (!) 153/99  Pulse: 97 97 95 (!) 109  Resp:  14 18 18   Temp:   98 F (36.7 C) 98.8 F (37.1 C)  TempSrc:   Oral Oral  SpO2: 97% 98% 97% 99%  Weight:        Wt Readings from Last 3 Encounters:  11/26/22 75 kg  06/18/22 83.5 kg  06/16/22 83.9 kg     Intake/Output Summary (Last 24 hours) at 12/01/2022 0917 Last data filed at 12/01/2022 0045 Gross per 24 hour  Intake 480 ml  Output --  Net 480 ml      Physical Exam  Awake Alert, No new F.N deficits, Normal affect [place].AT,PERRAL Supple Neck, No JVD,   Symmetrical Chest wall movement, Good air movement bilaterally, CTAB RRR,No Gallops,Rubs or new Murmurs,  +ve B.Sounds, Abd Soft, No tenderness,   Bilateral BKA, right BKA stump under bandage    Data Review:    Recent Labs  Lab 11/27/22 0525 11/28/22 1403 11/29/22 0239 11/30/22 0754 12/01/22 0736  WBC 21.0* 15.0* 12.3* 9.0 10.9*  HGB 9.9* 7.5* 7.2* 7.3* 6.4*  HCT 30.5* 22.7* 22.0* 22.8* 20.1*  PLT 882* 844* 759* 790* 828*  MCV 93.8 91.5 93.6 93.4 94.4  MCH 30.5 30.2 30.6 29.9 30.0  MCHC 32.5 33.0 32.7 32.0 31.8  RDW 13.6 13.8 13.7 13.5 13.6    Recent Labs  Lab 11/26/22 0813 11/26/22 1059 11/27/22 0525 11/28/22 1403 11/29/22 0239 11/29/22 1013 11/30/22 0754  NA 133*  --  138 136 136  --  131*  K 4.3  --  3.2* 3.1* 3.4*  --  3.8  CL 107  --  110 108 109  --  104  CO2 21*  --  18* 21* 19*  --  20*  ANIONGAP 5  --  10 7 8   --  7  GLUCOSE 298*  --  223* 70 80  --  145*  BUN 26*  --  24* 16 15  --  14  CREATININE 0.74  --  0.81 0.92 0.94  --  0.91  AST 25  --  21 25 21   --  18  ALT 31  --  27 22 22   --  17  ALKPHOS 492*  --  462* 419* 376*  --  358*  BILITOT 0.5  --  0.9 0.4 0.5  --  0.3  ALBUMIN <1.5*  --  1.6* <1.5* <1.5*  --  <1.5*  CRP  --   --   --  1.3* 0.8  --  0.5  PROCALCITON  --   --   --   <0.10 <0.10  --  <0.10  LATICACIDVEN  --  1.0  --   --   --   --   --   INR  --   --   --  1.1  --   --   --   TSH  --   --   --   --   --  1.401  --   BNP  --   --   --  1,610.9* 2,042.2*  --  1,098.0*  MG  --   --   --  1.6* 1.5*  --  2.1  CALCIUM 8.1*  --  8.7* 8.5* 8.0*  --  7.8*      Recent Labs  Lab 11/26/22 0813 11/26/22 1059 11/27/22 0525 11/28/22 1403 11/29/22 0239 11/29/22 1013 11/30/22 0754  CRP  --   --   --  1.3* 0.8  --  0.5  PROCALCITON  --   --   --  <0.10 <0.10  --  <0.10  LATICACIDVEN  --  1.0  --   --   --   --   --   INR  --   --   --  1.1  --   --   --   TSH  --   --   --   --   --  1.401  --   BNP  --   --   --  1,773.1* 2,042.2*  --  1,098.0*  MG  --   --   --  1.6* 1.5*  --  2.1  CALCIUM 8.1*  --  8.7* 8.5* 8.0*  --  7.8*     Radiology Reports CT HEAD WO CONTRAST ( )  Result Date: 11/29/2022 CLINICAL DATA:  32 year old female with complicated diabetes. Memory loss. EXAM: CT HEAD WITHOUT CONTRAST TECHNIQUE: Contiguous axial images were obtained from the base of the skull through the vertex without intravenous contrast. RADIATION DOSE REDUCTION: This exam was performed according to the departmental dose-optimization program which includes automated exposure control, adjustment of the mA and/or kV according to patient size and/or use of iterative reconstruction technique. COMPARISON:  Head CT 11/06/2021. FINDINGS: Brain: Cerebral volume remains within normal limits. No midline shift, ventriculomegaly, mass effect, evidence of mass lesion, intracranial hemorrhage or evidence of cortically based acute infarction. Gray-white matter differentiation is within normal limits throughout the brain. Vascular: No suspicious intracranial vascular hyperdensity. Skull: Stable, negative. Sinuses/Orbits: Visualized paranasal sinuses and mastoids are clear. Other: Postoperative changes to the right globe since last year. No acute orbit or scalp soft tissue finding. IMPRESSION:  1. Stable and normal noncontrast CT appearance of the brain. 2. Postoperative changes to the right globe since last year. Electronically Signed   By: Odessa Fleming M.D.   On: 11/29/2022 13:40   CT ABDOMEN PELVIS WO CONTRAST  Result Date: 11/28/2022 CLINICAL DATA:  Acute generalized abdominal pain status post bilateral amputation. EXAM: CT ABDOMEN AND PELVIS WITHOUT CONTRAST TECHNIQUE: Multidetector CT imaging of the abdomen and pelvis was performed following the standard protocol without IV contrast. RADIATION DOSE REDUCTION: This exam was performed according to the departmental dose-optimization program which includes automated exposure control, adjustment of the mA and/or kV according to patient size and/or use of iterative reconstruction technique. COMPARISON:  November 26, 2022. FINDINGS: Lower chest: No acute abnormality. Hepatobiliary: No focal liver abnormality is seen. No gallstones, gallbladder wall thickening, or biliary dilatation. Pancreas: Unremarkable. No pancreatic ductal dilatation or surrounding inflammatory changes. Spleen: Normal in size without focal abnormality. Adrenals/Urinary Tract: Adrenal glands are unremarkable. Kidneys are normal, without renal calculi, focal lesion, or hydronephrosis. Bladder is unremarkable. Stomach/Bowel: Stomach is unremarkable. The appendix appears normal. There is no evidence of bowel obstruction. There remains mild wall thickening of descending and sigmoid colon which may represent lack of distension, but infectious or inflammatory colitis cannot be excluded. Vascular/Lymphatic: No definite vascular abnormality. No significantly enlarged adenopathy is noted. Reproductive: Uterus and bilateral adnexa are unremarkable. Other: No ascites or hernia is noted.  Mild anasarca is noted. Musculoskeletal: No acute or significant osseous findings. IMPRESSION: There remains mild wall thickening of descending and sigmoid colon which may represent lack of distension, but infectious  or inflammatory colitis cannot be excluded. Probable mild anasarca. Electronically Signed   By: Lupita Raider M.D.   On: 11/28/2022 12:43      Signature  -   Susa Raring M.D on 12/01/2022 at 9:17 AM   -  To page go to [email or web address]

## 2022-12-01 NOTE — Plan of Care (Signed)
  Problem: Education: Goal: Knowledge of the prescribed therapeutic regimen will improve Outcome: Progressing Goal: Ability to verbalize activity precautions or restrictions will improve Outcome: Progressing Goal: Understanding of discharge needs will improve Outcome: Progressing   Problem: Activity: Goal: Ability to perform//tolerate increased activity and mobilize with assistive devices will improve Outcome: Progressing   Problem: Clinical Measurements: Goal: Postoperative complications will be avoided or minimized Outcome: Progressing   Problem: Self-Care: Goal: Ability to meet self-care needs will improve Outcome: Progressing   Problem: Pain Management: Goal: Pain level will decrease with appropriate interventions Outcome: Progressing

## 2022-12-01 NOTE — Discharge Instructions (Signed)
High-protein diet, protein supplements 3 times a day.

## 2022-12-01 NOTE — Progress Notes (Signed)
Orthopedic Tech Progress Note Patient Details:  Kelli Hudson 30-Jul-1990 [id number] Called in order to [facility] Patient ID: Kelli Hudson, female   DOB: Jun 18, 1990, 32 y.o.   MRN: [id number]  Kelli Hudson 12/01/2022, 7:13 PM

## 2022-12-01 NOTE — Progress Notes (Signed)
Pt with stump shrinkers in place bilateral AKA sites. Removed and dressings to both stumps cleaned and changed. Left AKA site with scant serosanguinous old drainage on dressing.   Right AKA site with half of abd pad covered in old blood. No active bleeding/drainage noted. Pt reports has been oozing blood yesterday and today. Per report from day RN right AKA site oozing blood earlier today and dressing changed during day. Site cleaned and new dressing placed. In am only small amt blood on dressing.

## 2022-12-01 NOTE — Progress Notes (Signed)
Physical Therapy Treatment Patient Details Name: Kelli Hudson MRN: [id number] DOB: 01-30-1991 Today's Date: 12/01/2022   History of Present Illness Pt is a 32 y/o F admitted on 11/26/22 after presenting from facility with c/o N&V, abdominal pain. Pt is being treated for sepsis, C. Difficile colitis. PMH: DM1, diabetic gastroparesis, HTN, recent admission for B necrotizing fasciitis requiring extensive debridement & amputations    PT Comments  Pt seen for PT tx with pt agreeable. Pt with bloody drainage from RLE (per chart, nurse already aware) & pt noted to be bleeding from IV insertion site during session - nurse came to room & addressed this. Session focused on transfers bed>drop arm recliner with pt performing lateral scoot with min assist. Pt with impaired balance & decreased awareness of new center of gravity. PT provides cuing for head/hips relationship & technique with pt requiring extra time to complete transfer. Pt notes lateral scoot was more effortful compared to AP transfer. Will continue to follow pt acutely to address transfer training, balance, & w/c mobility.   If plan is discharge home, recommend the following: A lot of help with walking and/or transfers;A little help with bathing/dressing/bathroom;Assistance with cooking/housework;Assist for transportation;Help with stairs or ramp for entrance   Can travel by private vehicle     No  Equipment Recommendations  Wheelchair (measurements PT);Wheelchair cushion (measurements PT)    Recommendations for Other Services Rehab consult     Precautions / Restrictions Precautions Precautions: Fall Restrictions Weight Bearing Restrictions: Yes RLE Weight Bearing: Non weight bearing LLE Weight Bearing: Non weight bearing     Mobility  Bed Mobility Overal bed mobility: Modified Independent             General bed mobility comments: Pt able to transition from semi fowler to long sitting with mod I, use of bed rails     Transfers Overall transfer level: Needs assistance Equipment used: None              Lateral/Scoot Transfers: Min assist General transfer comment: Pt performs lateral scoot bed>drop arm recliner on R with cuing for head/hips relationship, focus on new center of gravity (as pt with posterior LOB with min assist to correct). Pt requires extra time to complete lateral scoot. Pt with limited ability to push up with BUE to clear buttocks from chair.    Ambulation/Gait                   Stairs             Wheelchair Mobility     Tilt Bed    Modified Rankin (Stroke Patients Only)       Balance   Sitting-balance support: Bilateral upper extremity supported Sitting balance-Leahy Scale: Fair                                      Cognition Arousal: Alert Behavior During Therapy: WFL for tasks assessed/performed Overall Cognitive Status: Within Functional Limits for tasks assessed                                          Exercises      General Comments General comments (skin integrity, edema, etc.): Pt noted to have blood leaking from IV insertion site - nurse called to room & addressed. Max HR during transfer  123 bpm.      Pertinent Vitals/Pain Pain Assessment Pain Assessment: No/denies pain    Home Living                          Prior Function            PT Goals (current goals can now be found in the care plan section) Acute Rehab PT Goals Patient Stated Goal: be able to get dressed, transfer to w/c PT Goal Formulation: With patient Time For Goal Achievement: 12/12/22 Potential to Achieve Goals: Good Additional Goals Additional Goal #1: Pt will propel w/c x 150 ft with mod I to increase independence with mobility. Progress towards PT goals: Progressing toward goals    Frequency    Min 1X/week      PT Plan      Co-evaluation              AM-PAC PT "6 Clicks" Mobility   Outcome  Measure  Help needed turning from your back to your side while in a flat bed without using bedrails?: None Help needed moving from lying on your back to sitting on the side of a flat bed without using bedrails?: A Little Help needed moving to and from a bed to a chair (including a wheelchair)?: A Little Help needed standing up from a chair using your arms (e.g., wheelchair or bedside chair)?: Total Help needed to walk in hospital room?: Total Help needed climbing 3-5 steps with a railing? : Total 6 Click Score: 13    End of Session   Activity Tolerance: Patient tolerated treatment well Patient left: in chair;with nursing/sitter in room;with family/visitor present Nurse Communication: Mobility status PT Visit Diagnosis: Muscle weakness (generalized) (M62.81);Other abnormalities of gait and mobility (R26.89)     Time: 1610-9604 PT Time Calculation (min) (ACUTE ONLY): 24 min  Charges:    $Therapeutic Activity: 23-37 mins PT General Charges $$ ACUTE PT VISIT: 1 Visit                     Aleda Grana, PT, DPT 12/01/22, 1:49 PM   Sandi Mariscal 12/01/2022, 1:48 PM

## 2022-12-01 NOTE — Progress Notes (Signed)
While helping pt with ADL's this nurse noted blood on bed pad under right AKA, none under left AKA. Removed shrinker and noticed it to be damp with blood. Dressing from the previous night was saturated in serosanguinous drainage.   Removed dressings to right AKA and left AKA and cleansed with normal saline. This nurse did not note any active bleeding to end of both stump sites. Redressing with non adherent dressings and a ABD pad and secured with surgical tape, placed clean shrinker on.   1630: This nurse noted blood on bed pad again under right AKA and  removed shrinker. Observed 75% of previous ABD was saturated in serosanguinous drainage and  reinforced with another ABD pad and surgical tape and reapplied shrinker.

## 2022-12-01 NOTE — Progress Notes (Signed)
  Patient is complaining about abdominal pain 10 out of 10.  She is requesting for pain medications.  Patient denies any nausea and vomiting. Hemomedically stable. -Giving Protonix, Maalox as needed and patient already has fentanyl patch 50 mcg every 72 hours. - Checking abdominal x-ray to rule out any SBO - Giving 1 dose of morphine 1 mg.[name]   Tereasa Coop, MD [facility] 12/01/2022, 9:03 PM

## 2022-12-01 NOTE — Progress Notes (Signed)
   Palliative Medicine Inpatient Follow Up Note   Received a call from Kelli Hudson's mother, Kelli Hudson as she has our phone number from patients last hospitalization.  Kelli Hudson shares with me concerns in regards to [facility] prior facility. She expresses worry that going there caused patient to develop C.Diff. She requests evaluation at other facilities for placement.  I have sent a secure chat to Dr. Thedore Mins and Cristobal Goldmann, MSW. They are aware and inquiries at other facilities are being send.  No Charge  Please let the PMT know if additional support is needed at any which point.  ______________________________________________________________________________________ Kelli Hudson [facility] Palliative Medicine Team Team Cell Phone: [phone] Please utilize secure chat with additional questions, if there is no response within 30 minutes please call the above phone number  Palliative Medicine Team providers are available by phone from 7am to 7pm daily and can be reached through the team cell phone.  Should this patient require assistance outside of these hours, please call the patient's attending physician.

## 2022-12-01 NOTE — Progress Notes (Signed)
Patient ID: Kelli Hudson, female   DOB: 05-07-1990, 32 y.o.   MRN: [id number] Patient is seen in follow-up status post bilateral above-knee amputations.  Examination the left leg is clean and dry with no cellulitis.  Examination of the right leg she does have some lymphatic and serosanguineous drainage.  There is no purulence no signs of infection there is no cellulitis around the wound edges.  Discussed with the patient and her mother their desire to go to [facility] in [place].  Discussed that she will need a tighter stump shrinker, I will place an order and hopefully be able to get her the stump shrinker tomorrow morning.

## 2022-12-01 NOTE — TOC Progression Note (Addendum)
Transition of Care [facility]) - Progression Note    Patient Details  Name: Kelli Hudson MRN: [id number] Date of Birth: [birth year]  Transition of Care [facility]) CM/SW Contact  Mearl Latin, LCSW Phone Number: 12/01/2022, 2:20 PM  Clinical Narrative:    12pm-CSW met with patient, mother, and other family at bedside. CSW shared update that no additional bed offers have been located. She stated she had spoken with Kathlene November at [facility] and he requested referral be sent over.   CSW spoke with Kathlene November [phone]) and faxed referral to his central intake at [phone].  [facility] is reviewing patient.   4pm-CSW spoke with Kathlene November to inquire if he received the referral. He stated he will have to check with central intake in the morning.   CSW received call from patient's mother requesting [facility] inpatient rehab. CSW called and spoke with [place] who reported they are not in network with Medicaid plans. CSW asked her to review clinically in the event an exception can be made.   CSW contacted [facility] to submit a new screen for patient.   5:23 PM-[facility] has been submitted and is under review. Patient will require insurance approval prior to discharge to SNF.    Expected Discharge Plan: Skilled Nursing Facility Barriers to Discharge: Continued Medical Work up, English as a second language teacher, SNF Pending bed offer  Expected Discharge Plan and Services     Post Acute Care Choice: Skilled Nursing Facility                                         Social Determinants of Health (SDOH) Interventions SDOH Screenings   Food Insecurity: No Food Insecurity (10/03/2022)  Housing: Low Risk  (10/03/2022)  Transportation Needs: No Transportation Needs (10/03/2022)  Utilities: Not At Risk (10/03/2022)  Alcohol Screen: Low Risk  (05/29/2021)  Depression (PHQ2-9): Low Risk  (05/29/2021)  Recent Concern: Depression (PHQ2-9) - Medium Risk (04/10/2021)  Financial Resource Strain: Low Risk  (02/13/2022)    Received from [facility], [facility]  Social Connections: Unknown (06/17/2021)   Received from [facility], [facility]  Stress: No Stress Concern Present (09/16/2022)   Received from [facility]  Tobacco Use: Low Risk  (11/27/2022)    Readmission Risk Interventions    11/27/2022    2:33 PM 05/08/2022    9:45 AM 03/24/2022    3:33 PM  Readmission Risk Prevention Plan  Transportation Screening Complete Complete Complete  PCP or Specialist Appt within 3-5 Days Not Complete    HRI or Home Care Consult Complete    Social Work Consult for Recovery Care Planning/Counseling Complete    Palliative Care Screening Not Applicable    Medication Review Oceanographer) Complete Complete Complete  PCP or Specialist appointment within 3-5 days of discharge  Complete Complete  HRI or Home Care Consult  Complete Complete  SW Recovery Care/Counseling Consult  Complete Complete  Palliative Care Screening  Not Applicable Not Applicable  Skilled Nursing Facility  Not Applicable Not Applicable

## 2022-12-01 NOTE — Progress Notes (Signed)
  Bedside nurse reporting that  pt temp is 99.4 post blood transfusing and BP is 153/106 MAP 120 HR 113.  Per chart review patient has been admitted for Possible sepsis present on admission s/p bilateral AKA with possible infection at the stump site more likely on the right than left.  Per operative surgeon opinion postop lymphatic fluid collection.  ID evaluated patient no need for antibiotic at this time.  -Continue to monitor vitals.  No concern for infection at this time.  Tereasa Coop, MD [facility] 12/01/2022, 7:58 PM

## 2022-12-02 DIAGNOSIS — R112 Nausea with vomiting, unspecified: Secondary | ICD-10-CM | POA: Diagnosis not present

## 2022-12-02 LAB — CBC
HCT: 28.2 % — ABNORMAL LOW (ref 36.0–46.0)
Hemoglobin: 9.3 g/dL — ABNORMAL LOW (ref 12.0–15.0)
MCH: 30.3 pg (ref 26.0–34.0)
MCHC: 33 g/dL (ref 30.0–36.0)
MCV: 91.9 fL (ref 80.0–100.0)
Platelets: 717 10*3/uL — ABNORMAL HIGH (ref 150–400)
RBC: 3.07 MIL/uL — ABNORMAL LOW (ref 3.87–5.11)
RDW: 15.4 % (ref 11.5–15.5)
WBC: 10 10*3/uL (ref 4.0–10.5)
nRBC: 0 % (ref 0.0–0.2)

## 2022-12-02 LAB — TYPE AND SCREEN
ABO/RH(D): O POS
Antibody Screen: NEGATIVE
Unit division: 0
Unit division: 0

## 2022-12-02 LAB — BPAM RBC
Blood Product Expiration Date: 202410212359
Blood Product Expiration Date: 202411092359
ISSUE DATE / TIME: 202410141024
ISSUE DATE / TIME: 202410141645
Unit Type and Rh: 5100
Unit Type and Rh: 5100

## 2022-12-02 LAB — GLUCOSE, CAPILLARY
Glucose-Capillary: 144 mg/dL — ABNORMAL HIGH (ref 70–99)
Glucose-Capillary: 208 mg/dL — ABNORMAL HIGH (ref 70–99)
Glucose-Capillary: 154 mg/dL — ABNORMAL HIGH (ref 70–99)
Glucose-Capillary: 98 mg/dL (ref 70–99)

## 2022-12-02 LAB — PROCALCITONIN: Procalcitonin: <0.1 ng/mL

## 2022-12-02 LAB — MAGNESIUM: Magnesium: 1.8 mg/dL (ref 1.7–2.4)

## 2022-12-02 LAB — BRAIN NATRIURETIC PEPTIDE: B Natriuretic Peptide: 1252.9 pg/mL — ABNORMAL HIGH (ref 0.0–100.0)

## 2022-12-02 LAB — C-REACTIVE PROTEIN: CRP: 1.1 mg/dL — ABNORMAL HIGH (ref <1.0)

## 2022-12-02 NOTE — TOC Progression Note (Addendum)
Transition of Care [facility]) - Progression Note    Patient Details  Name: Kelli Hudson MRN: [id number] Date of Birth: 1990/07/20  Transition of Care [facility]) CM/SW Contact  Mearl Latin, LCSW Phone Number: 12/02/2022, 1:11 PM  Clinical Narrative:    11am-Patient denied by [facility]. [place] confirming patient's insurance and disability benefits prior to decision being made. Kathlene November with [facility] is awaiting confirmation from central intake.   CSW updated patient's mother.  4:39 PM-Mike and Lewayne Bunting still awaiting answer from their central intake.    Expected Discharge Plan: Skilled Nursing Facility Barriers to Discharge: Continued Medical Work up, English as a second language teacher, SNF Pending bed offer  Expected Discharge Plan and Services     Post Acute Care Choice: Skilled Nursing Facility                                         Social Determinants of Health (SDOH) Interventions SDOH Screenings   Food Insecurity: No Food Insecurity (10/03/2022)  Housing: Low Risk  (10/03/2022)  Transportation Needs: No Transportation Needs (10/03/2022)  Utilities: Not At Risk (10/03/2022)  Alcohol Screen: Low Risk  (05/29/2021)  Depression (PHQ2-9): Low Risk  (05/29/2021)  Recent Concern: Depression (PHQ2-9) - Medium Risk (04/10/2021)  Financial Resource Strain: Low Risk  (02/13/2022)   Received from [facility], [facility]  Social Connections: Unknown (06/17/2021)   Received from [facility]  Stress: No Stress Concern Present (09/16/2022)   Received from [facility]  Tobacco Use: Low Risk  (11/27/2022)    Readmission Risk Interventions    11/27/2022    2:33 PM 05/08/2022    9:45 AM 03/24/2022    3:33 PM  Readmission Risk Prevention Plan  Transportation Screening Complete Complete Complete  PCP or Specialist Appt within 3-5 Days Not Complete    HRI or Home Care Consult Complete    Social Work Consult for Recovery Care  Planning/Counseling Complete    Palliative Care Screening Not Applicable    Medication Review Oceanographer) Complete Complete Complete  PCP or Specialist appointment within 3-5 days of discharge  Complete Complete  HRI or Home Care Consult  Complete Complete  SW Recovery Care/Counseling Consult  Complete Complete  Palliative Care Screening  Not Applicable Not Applicable  Skilled Nursing Facility  Not Applicable Not Applicable

## 2022-12-02 NOTE — Progress Notes (Addendum)
PROGRESS NOTE                                                                                                                                                                                                             Patient Demographics:    Kelli Hudson, is a 32 y.o. female, DOB - 1990/07/16, UYQ:[id number]  Outpatient Primary MD for the patient is Norm Salt, [place]    LOS - 6  Admit date - 11/26/2022    Chief Complaint  Patient presents with   Emesis       Brief Narrative (HPI from H&P)   32 y.o. 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  Was discharged to SNF.  Now being admitted from SNF for nausea vomiting some ongoing lower extremity stump pain.   Subjective:   Patient in bed, appears comfortable, denies any headache, no fever, no chest pain or pressure, no shortness of breath , chronic abdominal pain, in no distress in bed woken up from sleep but wants more Dilaudid. No focal weakness.   Assessment  & Plan :   Now medically stable.  Looking for a new SNF bed per patient and family request.    Possible sepsis present on admission s/p bilateral AKA with possible infection at the stump site more likely on the right than left.  CTA images reviewed, seen by Dr. Lajoyce Corners, case discussed with ID physician Dr. Algis Liming.  Orthopedics is of the opinion that this is postop lymphatic fluid collection, ID does not think patient has any systemic or stump infection and has discontinued all parental antibiotics.  Monitor inflammatory markers and clinically.  Seen by Dr. Lajoyce Corners again on 12/01/2022, no changes.  Acute on chronic nausea vomiting due to gastroparesis, initially had  some diarrhea but no bowel movements in the last 2 days.  C. difficile testing inconclusive.  Abdominal exam benign.  Supportive care, scheduled Reglan, as needed antiemetics, bowel rest, IV fluids, x 2 shows no ileus, repeat abdominal x-ray on 12/02/2022 stable, abdominal exam benign, diarrhea resolved the day she was admitted and having formed stool, 10 days of oral vancomycin per ID.  Clinically all nausea vomiting diarrhea has  resolved.  Anemia of chronic disease with acute fall due to IV fluids for hydration and some right AKA stump site blood loss.  Transfused 2 units of packed RBCs on 12/01/2022, hold Lovenox, Pepcid as avoiding PPI due to possible C. difficile colonization, no signs of active bleeding, transfusion CBC stable on 12/02/2022, seen again by orthopedic surgeon Dr. Lajoyce Corners on 12/01/2018 for stump stable.  Continue to wear stump shrinker.  HX of narcotic seeking behavior.  Asking for more IV Dilaudid despite being woken from sleep and being in no distress, politely declined, continue to monitor.    GERD.  Supportive care for now.  Hypokalemia and hypomagnesemia.  Replaced.   Hypertension.  On Coreg, add Norvasc and monitor.  Diabetic peripheral neuropathy.  On Neurontin.  For memory per patient and family members.  Recent staples over the stumps hence no MRI, stable/non acute CT head, B12, TSH, RPR and HIV screen, remains AAO x 3 here, postdischarge outpatient neurology follow-up.  DM type I.  Not on DKA.  Low-dose Semglee and sliding scale.  Monitor  Lab Results  Component Value Date   HGBA1C 5.9 (H) 11/18/2022   CBG (last 3)  Recent Labs    12/01/22 1657 12/01/22 2113 12/02/22 0821  GLUCAP 170* 130* 144*        Condition - Extremely Guarded  Family Communication  : Mother updated over the phone [phone] - 11/29/2022, message left morning of 11/30/2022 at 8:45 PM, called 12/01/2022 at 9:20 AM  Code Status :  Full  Consults  :  Ortho, ID  PUD Prophylaxis :     Procedures  :     CT head 11/29/2022.  Nonacute.    Repeat CT scan 11/28/2022.   There remains mild wall thickening of descending and sigmoid colon which may represent lack of distension, but infectious or inflammatory colitis cannot be excluded. Probable mild anasarca  CT -  11/26/22: 1. Interval postsurgical changes of revised, now bilateral above-the-knee amputation. 2. There are bilateral low-density collections just distal to the amputation stumps extending up the distal lateral greater than medial thigh. These collections contain moderate scattered air and are suspicious for abscesses. These collections appear to extend to the prior surgical incision at the distal stump, possibly draining to the skin surface. 3. There is complex fluid tracking between the lateral thigh subcutaneous fat and the vastus lateralis muscles bilaterally, as above. 4. Interval decrease in size of small complex fluid collections superficial to the anteromedial aspect of the right sartorius muscle and the junction of the left sartorius and vastus medialis muscles, likely minimally draining to the anteromedial thigh skin surface.      Disposition Plan  :    Status is: Inpatient  DVT Prophylaxis  :    Place and maintain sequential compression device Start: 12/01/22 0915   Lab Results  Component Value Date   PLT 717 (H) 12/02/2022    Diet :  Diet Order             Diet heart healthy/carb modified Room service appropriate? Yes; Fluid consistency: Thin  Diet effective now                    Inpatient Medications  Scheduled Meds:  amLODipine  10 mg Oral Daily   ascorbic acid  1,000 mg Oral Daily   carvedilol  6.25 mg Oral BID WC   famotidine  20 mg Oral BID   fentaNYL  1 patch Transdermal Q72H   gabapentin  300 mg Oral Q8H   insulin aspart  0-9 Units Subcutaneous TID WC   insulin detemir  4 Units Subcutaneous Daily   metoCLOPramide (REGLAN) injection  10 mg Intravenous Q8H   nutrition  supplement (JUVEN)  1 packet Oral BID BM   sucralfate  1 g Oral TID WC & HS   vancomycin  125 mg Oral QID   zinc sulfate  220 mg Oral Daily   Continuous Infusions:   PRN Meds:.acetaminophen **OR** acetaminophen, alum & mag hydroxide-simeth, hydrALAZINE, HYDROmorphone (DILAUDID) injection, ondansetron (ZOFRAN) IV, mouth rinse   Objective:   Vitals:   12/02/22 0436 12/02/22 0515 12/02/22 0523 12/02/22 0819  BP: (!) 178/106 (!) 172/115 (!) 172/115 132/82  Pulse: (!) 103   (!) 102  Resp: 17   15  Temp: 98.9 F (37.2 C)   98.7 F (37.1 C)  TempSrc: Oral   Oral  SpO2: 97%     Weight:        Wt Readings from Last 3 Encounters:  11/26/22 75 kg  06/18/22 83.5 kg  06/16/22 83.9 kg     Intake/Output Summary (Last 24 hours) at 12/02/2022 0841 Last data filed at 12/02/2022 0825 Gross per 24 hour  Intake 678 ml  Output 2700 ml  Net -2022 ml      Physical Exam  Awake Alert, No new F.N deficits, Normal affect [place].AT,PERRAL Supple Neck, No JVD,   Symmetrical Chest wall movement, Good air movement bilaterally, CTAB RRR,No Gallops,Rubs or new Murmurs,  +ve B.Sounds, Abd Soft, No tenderness,   Bilateral BKA, right BKA stump under bandage    Data Review:    Recent Labs  Lab 11/29/22 0239 11/30/22 0754 12/01/22 0736 12/01/22 2043 12/02/22 0312  WBC 12.3* 9.0 10.9* 10.7* 10.0  HGB 7.2* 7.3* 6.4* 10.1* 9.3*  HCT 22.0* 22.8* 20.1* 29.8* 28.2*  PLT 759* 790* 828* 651* 717*  MCV 93.6 93.4 94.4 89.2 91.9  MCH 30.6 29.9 30.0 30.2 30.3  MCHC 32.7 32.0 31.8 33.9 33.0  RDW 13.7 13.5 13.6 15.1 15.4    Recent Labs  Lab 11/26/22 0813 11/26/22 1059 11/27/22 0525 11/28/22 1403 11/29/22 0239 11/29/22 1013 11/30/22 0754 12/01/22 0736 12/02/22 0312  NA 133*  --  138 136 136  --  131*  --   --   K 4.3  --  3.2* 3.1* 3.4*  --  3.8  --   --   CL 107  --  110 108 109  --  104  --   --   CO2 21*  --  18* 21* 19*  --  20*  --   --   ANIONGAP 5  --  10 7 8   --  7  --   --    GLUCOSE 298*  --  223* 70 80  --  145*  --   --   BUN 26*  --  24* 16 15  --  14  --   --   CREATININE 0.74  --  0.81 0.92 0.94  --  0.91 0.83  --   AST 25  --  21 25 21   --  18  --   --   ALT 31  --  27 22 22   --  17  --   --   ALKPHOS 492*  --  462* 419* 376*  --  358*  --   --   BILITOT 0.5  --  0.9 0.4 0.5  --  0.3  --   --  ALBUMIN <1.5*  --  1.6* <1.5* <1.5*  --  <1.5*  --   --   CRP  --   --   --  1.3* 0.8  --  0.5 0.6 1.1*  PROCALCITON  --   --   --  <0.10 <0.10  --  <0.10 <0.10 <0.10  LATICACIDVEN  --  1.0  --   --   --   --   --   --   --   INR  --   --   --  1.1  --   --   --   --   --   TSH  --   --   --   --   --  1.401  --   --   --   BNP  --   --   --  1,773.1* 2,042.2*  --  1,098.0* 1,207.2* 1,252.9*  MG  --   --   --  1.6* 1.5*  --  2.1 1.9 1.8  CALCIUM 8.1*  --  8.7* 8.5* 8.0*  --  7.8*  --   --       Recent Labs  Lab 11/26/22 0813 11/26/22 1059 11/27/22 0525 11/28/22 1403 11/29/22 0239 11/29/22 1013 11/30/22 0754 12/01/22 0736 12/02/22 0312  CRP  --   --   --  1.3* 0.8  --  0.5 0.6 1.1*  PROCALCITON  --   --   --  <0.10 <0.10  --  <0.10 <0.10 <0.10  LATICACIDVEN  --  1.0  --   --   --   --   --   --   --   INR  --   --   --  1.1  --   --   --   --   --   TSH  --   --   --   --   --  1.401  --   --   --   BNP  --   --   --  1,773.1* 2,042.2*  --  1,098.0* 1,207.2* 1,252.9*  MG  --   --   --  1.6* 1.5*  --  2.1 1.9 1.8  CALCIUM 8.1*  --  8.7* 8.5* 8.0*  --  7.8*  --   --      Radiology Reports DG Abd Portable 1V  Result Date: 12/02/2022 CLINICAL DATA:  Small-bowel obstruction EXAM: PORTABLE ABDOMEN - 1 VIEW COMPARISON:  Abdominal x-ray 10/10/2022. CT abdomen and pelvis 11/28/2022. FINDINGS: Generator and wires are seen overlying the lower abdomen, unchanged. Bowel-gas pattern is nonobstructive. No suspicious calcifications are seen. No acute fractures are seen. IMPRESSION: Nonobstructive bowel-gas pattern. Electronically Signed   By: Darliss Cheney M.D.    On: 12/02/2022 01:52   CT HEAD WO CONTRAST ( )  Result Date: 11/29/2022 CLINICAL DATA:  32 year old female with complicated diabetes. Memory loss. EXAM: CT HEAD WITHOUT CONTRAST TECHNIQUE: Contiguous axial images were obtained from the base of the skull through the vertex without intravenous contrast. RADIATION DOSE REDUCTION: This exam was performed according to the departmental dose-optimization program which includes automated exposure control, adjustment of the mA and/or kV according to patient size and/or use of iterative reconstruction technique. COMPARISON:  Head CT 11/06/2021. FINDINGS: Brain: Cerebral volume remains within normal limits. No midline shift, ventriculomegaly, mass effect, evidence of mass lesion, intracranial hemorrhage or evidence of cortically based acute infarction. Gray-white matter differentiation is within normal limits throughout the brain. Vascular: No suspicious intracranial  vascular hyperdensity. Skull: Stable, negative. Sinuses/Orbits: Visualized paranasal sinuses and mastoids are clear. Other: Postoperative changes to the right globe since last year. No acute orbit or scalp soft tissue finding. IMPRESSION: 1. Stable and normal noncontrast CT appearance of the brain. 2. Postoperative changes to the right globe since last year. Electronically Signed   By: Odessa Fleming M.D.   On: 11/29/2022 13:40   CT ABDOMEN PELVIS WO CONTRAST  Result Date: 11/28/2022 CLINICAL DATA:  Acute generalized abdominal pain status post bilateral amputation. EXAM: CT ABDOMEN AND PELVIS WITHOUT CONTRAST TECHNIQUE: Multidetector CT imaging of the abdomen and pelvis was performed following the standard protocol without IV contrast. RADIATION DOSE REDUCTION: This exam was performed according to the departmental dose-optimization program which includes automated exposure control, adjustment of the mA and/or kV according to patient size and/or use of iterative reconstruction technique. COMPARISON:  November 26, 2022. FINDINGS: Lower chest: No acute abnormality. Hepatobiliary: No focal liver abnormality is seen. No gallstones, gallbladder wall thickening, or biliary dilatation. Pancreas: Unremarkable. No pancreatic ductal dilatation or surrounding inflammatory changes. Spleen: Normal in size without focal abnormality. Adrenals/Urinary Tract: Adrenal glands are unremarkable. Kidneys are normal, without renal calculi, focal lesion, or hydronephrosis. Bladder is unremarkable. Stomach/Bowel: Stomach is unremarkable. The appendix appears normal. There is no evidence of bowel obstruction. There remains mild wall thickening of descending and sigmoid colon which may represent lack of distension, but infectious or inflammatory colitis cannot be excluded. Vascular/Lymphatic: No definite vascular abnormality. No significantly enlarged adenopathy is noted. Reproductive: Uterus and bilateral adnexa are unremarkable. Other: No ascites or hernia is noted.  Mild anasarca is noted. Musculoskeletal: No acute or significant osseous findings. IMPRESSION: There remains mild wall thickening of descending and sigmoid colon which may represent lack of distension, but infectious or inflammatory colitis cannot be excluded. Probable mild anasarca. Electronically Signed   By: Lupita Raider M.D.   On: 11/28/2022 12:43      Signature  -   Susa Raring M.D on 12/02/2022 at 8:41 AM   -  To page go to [email or web address]

## 2022-12-02 NOTE — Progress Notes (Addendum)
Occupational Therapy Treatment Patient Details Name: Kelli Hudson MRN: [id number] DOB: 1990/08/12 Today's Date: 12/02/2022   History of present illness Pt is a 32 y/o F admitted on 11/26/22 after presenting from facility with c/o N&V, abdominal pain. Pt is being treated for sepsis, C. Difficile colitis. 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 Focused session on UE HEP education to maximize upper body strength for transfers, bed mobility independence, therapeutic activities and considerations for DME needs at home (focusing on home setup at wheelchair level). Pt hopeful to regain independence with post acute rehab at DC. Feel intensive rehab worth considering given pt's current functional abilities (moving better than prior admission) and rehab potential to reach a modified independent level if family able to provide the needed support at DC.      If plan is discharge home, recommend the following:  A little help with walking and/or transfers;Assistance with cooking/housework;Assist for transportation;A lot of help with bathing/dressing/bathroom   Equipment Recommendations  Wheelchair (measurements OT);Wheelchair cushion (measurements OT) (drop arm BSC)    Recommendations for Other Services Rehab consult    Precautions / Restrictions Precautions Precautions: Fall Restrictions Weight Bearing Restrictions: Yes RLE Weight Bearing: Non weight bearing LLE Weight Bearing: Non weight bearing       Mobility Bed Mobility Overal bed mobility: Modified Independent Bed Mobility: Rolling Rolling: Modified independent (Device/Increase time), Used rails         General bed mobility comments: reported desire to work on rolling. cued to use bed rails with pt able to roll R and L without assist. discussed scooting bottom to EOB to allow more room to  roll towards prone position    Transfers                         Balance Overall balance assessment: Needs assistance Sitting-balance support: Bilateral upper extremity supported Sitting balance-Leahy Scale: Fair                                     ADL either performed or assessed with clinical judgement   ADL Overall ADL's : Needs assistance/impaired                                       General ADL Comments: Pt very motivated. educated on UE HEP, importance of positioning and achieving prone position for stretches, potential DME needs/home setup    Extremity/Trunk Assessment Upper Extremity Assessment Upper Extremity Assessment: Overall WFL for tasks assessed;Right hand dominant   Lower Extremity Assessment Lower Extremity Assessment: Defer to PT evaluation        Vision   Vision Assessment?: No apparent visual deficits   Perception     Praxis      Cognition Arousal: Alert Behavior During Therapy: WFL for tasks assessed/performed Overall Cognitive Status: Within Functional Limits for tasks assessed                                          Exercises Exercises: General Upper Extremity General Exercises - Upper Extremity Shoulder Flexion: Strengthening, Both, 10 reps, Theraband Theraband Level (Shoulder Flexion): Level  3 (Green) Shoulder Horizontal ABduction: Strengthening, 5 reps, Seated, Theraband Theraband Level (Shoulder Horizontal Abduction): Level 3 (Green) Elbow Flexion: Strengthening, Both, 10 reps, Theraband Theraband Level (Elbow Flexion): Level 3 (Green) Elbow Extension: Strengthening, Both, 10 reps, Theraband Theraband Level (Elbow Extension): Level 3 (Green)    Shoulder Instructions       General Comments Discussed larger BSC to allow pt room to AP transfer to/from commode and defer bedpan use. RN aware and unit secretary assisted OT in looking for [facility] alternatives but unable to locate     Pertinent Vitals/ Pain       Pain Assessment Pain Assessment: No/denies pain  Home Living                                          Prior Functioning/Environment              Frequency  Min 1X/week        Progress Toward Goals  OT Goals(current goals can now be found in the care plan section)  Progress towards OT goals: Progressing toward goals  Acute Rehab OT Goals Patient Stated Goal: to be as independent as possible OT Goal Formulation: With patient Time For Goal Achievement: 12/12/22 Potential to Achieve Goals: Good ADL Goals Pt Will Perform Grooming: sitting;with modified independence Pt Will Perform Lower Body Bathing: bed level;with modified independence Pt Will Perform Lower Body Dressing: with modified independence;bed level Pt Will Transfer to Toilet: anterior/posterior transfer;with set-up;with supervision Pt Will Perform Toileting - Clothing Manipulation and hygiene: with supervision;with set-up;sitting/lateral leans Pt/caregiver will Perform Home Exercise Program: Increased strength;Both right and left upper extremity;With written HEP provided;Independently  Plan      Co-evaluation                 AM-PAC OT "6 Clicks" Daily Activity     Outcome Measure   Help from another person eating meals?: None Help from another person taking care of personal grooming?: A Little Help from another person toileting, which includes using toliet, bedpan, or urinal?: A Little Help from another person bathing (including washing, rinsing, drying)?: A Lot Help from another person to put on and taking off regular upper body clothing?: A Little Help from another person to put on and taking off regular lower body clothing?: A Lot 6 Click Score: 17    End of Session    OT Visit Diagnosis: Muscle weakness (generalized) (M62.81);Pain Pain - Right/Left: Left Pain - part of body: Leg   Activity Tolerance Patient tolerated treatment well    Patient Left in bed;with call bell/phone within reach   Nurse Communication Mobility status        Time: 1126-1209 OT Time Calculation (min): 43 min  Charges: OT General Charges $OT Visit: 1 Visit OT Treatments $Self Care/Home Management : 8-22 mins $Therapeutic Activity: 8-22 mins $Therapeutic Exercise: 8-22 mins  Bradd Canary, OTR/L Acute Rehab Services Office: [phone]   Lorre Munroe 12/02/2022, 2:44 PM

## 2022-12-03 DIAGNOSIS — A0472 Enterocolitis due to Clostridium difficile, not specified as recurrent: Secondary | ICD-10-CM

## 2022-12-03 DIAGNOSIS — R112 Nausea with vomiting, unspecified: Secondary | ICD-10-CM | POA: Diagnosis not present

## 2022-12-03 LAB — MAGNESIUM: Magnesium: 1.8 mg/dL (ref 1.7–2.4)

## 2022-12-03 LAB — GLUCOSE, CAPILLARY
Glucose-Capillary: 203 mg/dL — ABNORMAL HIGH (ref 70–99)
Glucose-Capillary: 198 mg/dL — ABNORMAL HIGH (ref 70–99)

## 2022-12-03 LAB — MISC LABCORP TEST (SEND OUT)
LabCorp test name: 96388
Labcorp test code: 96388

## 2022-12-03 LAB — CBC WITH DIFFERENTIAL/PLATELET
Abs Immature Granulocytes: 0.04 10*3/uL (ref 0.00–0.07)
Basophils Absolute: 0.1 10*3/uL (ref 0.0–0.1)
Basophils Relative: 1 %
Eosinophils Absolute: 0.5 10*3/uL (ref 0.0–0.5)
Eosinophils Relative: 5 %
HCT: 32.3 % — ABNORMAL LOW (ref 36.0–46.0)
Hemoglobin: 10.3 g/dL — ABNORMAL LOW (ref 12.0–15.0)
Immature Granulocytes: 0 %
Lymphocytes Relative: 19 %
Lymphs Abs: 1.7 10*3/uL (ref 0.7–4.0)
MCH: 29.3 pg (ref 26.0–34.0)
MCHC: 31.9 g/dL (ref 30.0–36.0)
MCV: 91.8 fL (ref 80.0–100.0)
Monocytes Absolute: 1 10*3/uL (ref 0.1–1.0)
Monocytes Relative: 11 %
Neutro Abs: 5.8 10*3/uL (ref 1.7–7.7)
Neutrophils Relative %: 64 %
Platelets: 752 10*3/uL — ABNORMAL HIGH (ref 150–400)
RBC: 3.52 MIL/uL — ABNORMAL LOW (ref 3.87–5.11)
RDW: 15 % (ref 11.5–15.5)
WBC: 9.2 10*3/uL (ref 4.0–10.5)
nRBC: 0 % (ref 0.0–0.2)

## 2022-12-03 LAB — BASIC METABOLIC PANEL
Anion gap: 8 (ref 5–15)
BUN: 27 mg/dL — ABNORMAL HIGH (ref 6–20)
CO2: 20 mmol/L — ABNORMAL LOW (ref 22–32)
Calcium: 8.5 mg/dL — ABNORMAL LOW (ref 8.9–10.3)
Chloride: 106 mmol/L (ref 98–111)
Creatinine, Ser: 1.07 mg/dL — ABNORMAL HIGH (ref 0.44–1.00)
GFR, Estimated: >60 mL/min (ref >60)
Glucose, Bld: 254 mg/dL — ABNORMAL HIGH (ref 70–99)
Potassium: 5 mmol/L (ref 3.5–5.1)
Sodium: 134 mmol/L — ABNORMAL LOW (ref 135–145)

## 2022-12-03 LAB — CULTURE, BLOOD (ROUTINE X 2)
Culture: NO GROWTH
Culture: NO GROWTH
Special Requests: ADEQUATE

## 2022-12-03 LAB — PROCALCITONIN: Procalcitonin: <0.1 ng/mL

## 2022-12-03 MED ORDER — INSULIN ASPART 100 UNIT/ML IJ SOLN: 0.0000 [IU] | Freq: Three times a day (TID) | INTRAMUSCULAR | Status: AC

## 2022-12-03 MED ORDER — VANCOMYCIN HCL 125 MG PO CAPS: 125.0000 mg | ORAL_CAPSULE | Freq: Four times a day (QID) | ORAL | Status: DC

## 2022-12-03 MED ORDER — FAMOTIDINE 20 MG PO TABS: 20.0000 mg | ORAL_TABLET | Freq: Two times a day (BID) | ORAL | Status: AC

## 2022-12-03 MED ORDER — HYDROMORPHONE HCL 2 MG PO TABS: 2.0000 mg | ORAL_TABLET | ORAL | 0 refills | Status: DC | PRN

## 2022-12-03 MED ORDER — FUROSEMIDE 20 MG PO TABS: 20.0000 mg | ORAL_TABLET | Freq: Every day | ORAL | Status: DC | PRN

## 2022-12-03 MED ORDER — AMLODIPINE BESYLATE 10 MG PO TABS: 10.0000 mg | ORAL_TABLET | Freq: Every day | ORAL | Status: AC

## 2022-12-03 MED ORDER — FENTANYL 50 MCG/HR TD PT72: 1.0000 | MEDICATED_PATCH | TRANSDERMAL | 0 refills | Status: DC

## 2022-12-03 MED ORDER — CARVEDILOL 6.25 MG PO TABS: 6.2500 mg | ORAL_TABLET | Freq: Two times a day (BID) | ORAL | Status: AC

## 2022-12-03 MED ORDER — GLUCERNA SHAKE PO LIQD: 237.0000 mL | Freq: Three times a day (TID) | ORAL | Status: DC

## 2022-12-03 MED ORDER — ZINC SULFATE 220 (50 ZN) MG PO CAPS: 220.0000 mg | ORAL_CAPSULE | Freq: Every day | ORAL | Status: AC

## 2022-12-03 NOTE — Plan of Care (Signed)
Problem: Education: Goal: Knowledge of the prescribed therapeutic regimen will improve Outcome: Adequate for Discharge Goal: Ability to verbalize activity precautions or restrictions will improve Outcome: Adequate for Discharge Goal: Understanding of discharge needs will improve Outcome: Adequate for Discharge   Problem: Activity: Goal: Ability to perform//tolerate increased activity and mobilize with assistive devices will improve Outcome: Adequate for Discharge   Problem: Clinical Measurements: Goal: Postoperative complications will be avoided or minimized Outcome: Adequate for Discharge   Problem: Self-Care: Goal: Ability to meet self-care needs will improve Outcome: Adequate for Discharge   Problem: Self-Concept: Goal: Ability to maintain and perform role responsibilities to the fullest extent possible will improve Outcome: Adequate for Discharge   Problem: Pain Management: Goal: Pain level will decrease with appropriate interventions Outcome: Adequate for Discharge   Problem: Skin Integrity: Goal: Demonstration of wound healing without infection will improve Outcome: Adequate for Discharge   Problem: Activity: Goal: Ability to tolerate increased activity will improve Outcome: Adequate for Discharge   Problem: Respiratory: Goal: Ability to maintain a clear airway and adequate ventilation will improve Outcome: Adequate for Discharge   Problem: Role Relationship: Goal: Method of communication will improve Outcome: Adequate for Discharge   Problem: Education: Goal: Knowledge of General Education information will improve Description: Including pain rating scale, medication(s)/side effects and non-pharmacologic comfort measures Outcome: Adequate for Discharge   Problem: Health Behavior/Discharge Planning: Goal: Ability to manage health-related needs will improve Outcome: Adequate for Discharge   Problem: Clinical Measurements: Goal: Ability to maintain clinical  measurements within normal limits will improve Outcome: Adequate for Discharge Goal: Will remain free from infection Outcome: Adequate for Discharge Goal: Diagnostic test results will improve Outcome: Adequate for Discharge Goal: Respiratory complications will improve Outcome: Adequate for Discharge Goal: Cardiovascular complication will be avoided Outcome: Adequate for Discharge   Problem: Activity: Goal: Risk for activity intolerance will decrease Outcome: Adequate for Discharge   Problem: Nutrition: Goal: Adequate nutrition will be maintained Outcome: Adequate for Discharge   Problem: Coping: Goal: Level of anxiety will decrease Outcome: Adequate for Discharge   Problem: Elimination: Goal: Will not experience complications related to bowel motility Outcome: Adequate for Discharge Goal: Will not experience complications related to urinary retention Outcome: Adequate for Discharge   Problem: Pain Managment: Goal: General experience of comfort will improve Outcome: Adequate for Discharge   Problem: Safety: Goal: Ability to remain free from injury will improve Outcome: Adequate for Discharge   Problem: Skin Integrity: Goal: Risk for impaired skin integrity will decrease Outcome: Adequate for Discharge   Problem: Education: Goal: Ability to describe self-care measures that may prevent or decrease complications (Diabetes Survival Skills Education) will improve Outcome: Adequate for Discharge Goal: Individualized Educational Video(s) Outcome: Adequate for Discharge   Problem: Coping: Goal: Ability to adjust to condition or change in health will improve Outcome: Adequate for Discharge   Problem: Fluid Volume: Goal: Ability to maintain a balanced intake and output will improve Outcome: Adequate for Discharge   Problem: Health Behavior/Discharge Planning: Goal: Ability to identify and utilize available resources and services will improve Outcome: Adequate for  Discharge Goal: Ability to manage health-related needs will improve Outcome: Adequate for Discharge   Problem: Metabolic: Goal: Ability to maintain appropriate glucose levels will improve Outcome: Adequate for Discharge   Problem: Nutritional: Goal: Maintenance of adequate nutrition will improve Outcome: Adequate for Discharge Goal: Progress toward achieving an optimal weight will improve Outcome: Adequate for Discharge   Problem: Skin Integrity: Goal: Risk for impaired skin integrity will decrease Outcome: Adequate  for Discharge   Problem: Tissue Perfusion: Goal: Adequacy of tissue perfusion will improve Outcome: Adequate for Discharge

## 2022-12-03 NOTE — Progress Notes (Signed)
Physical Therapy Treatment Patient Details Name: Kelli Hudson MRN: [id number] DOB: 31-Oct-1990 Today's Date: 12/03/2022   History of Present Illness Pt is a 32 y/o F admitted on 11/26/22 after presenting from facility with c/o N&V, abdominal pain. Pt is being treated for sepsis, C. Difficile colitis. 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 Pt was Min A for anterior/posterior transfer from bed<>W/C and supervision for w/c propulsion 150 ft. Pt requires Max A at this time for W/C management for set up. Due to pt current functional status, home set up and available assistance at home recommending skilled physical therapy services < 3 hours/day on discharge from acute care hospital setting in order to improve independence, decrease risk for falls, injury and re-hospitalization. Pt tolerated treatment session well.     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     Mobility  Bed Mobility Overal bed mobility: Modified Independent Bed Mobility: Supine to Sit, Sit to Supine     Supine to sit: Modified independent (Device/Increase time) Sit to supine: Modified independent (Device/Increase time)        Transfers Overall transfer level: Needs assistance Equipment used:  None Transfers: Bed to chair/wheelchair/BSC         Anterior-Posterior transfers: Min assist   General transfer comment: Min A transfering from EOB to W/C with A<>P transfer       Merchant navy officer propulsion: Both upper extremities Wheelchair parts: Supervision/cueing Distance: 150 Wheelchair Assistance Details (indicate cue type and reason): multiple obstacles, pt does have to stop and reset with new obstacles        Balance Overall balance assessment: Needs assistance Sitting-balance support: Bilateral upper extremity supported Sitting balance-Leahy Scale: Fair Sitting balance - Comments: long sitting in bed          Cognition Arousal: Alert Behavior During Therapy: WFL for tasks assessed/performed Overall Cognitive Status: Within Functional Limits for tasks assessed       General Comments: Pleasant, motivated to participate.           General Comments General comments (skin integrity, edema, etc.): No noted drainage at incision wrappings      Pertinent Vitals/Pain Pain Assessment Pain Assessment: No/denies pain     PT Goals (current goals can now be found in the care plan section) Acute Rehab PT Goals Patient Stated Goal: be able to get dressed, transfer to w/c PT Goal Formulation: With patient Time For Goal Achievement: 12/12/22 Potential to Achieve Goals: Good Additional Goals Additional Goal #1: Pt will propel w/c x 150 ft with mod I to increase independence with mobility. Progress towards PT goals: Progressing toward goals    Frequency    Min 1X/week      PT Plan  Continue with current POC       AM-PAC PT "6 Clicks" Mobility   Outcome Measure  Help needed turning from your back to your  side while in a flat bed without using bedrails?: None Help needed moving from lying on your back to sitting on the side of a flat bed without using bedrails?: None Help needed moving to and from a bed to a chair  (including a wheelchair)?: A Little Help needed standing up from a chair using your arms (e.g., wheelchair or bedside chair)?: Total Help needed to walk in hospital room?: Total Help needed climbing 3-5 steps with a railing? : Total 6 Click Score: 14    End of Session Equipment Utilized During Treatment: Gait belt Activity Tolerance: Patient tolerated treatment well Patient left: in bed;with call bell/phone within reach Nurse Communication: Mobility status PT Visit Diagnosis: Muscle weakness (generalized) (M62.81);Other abnormalities of gait and mobility (R26.89)     Time: 6962-9528 PT Time Calculation (min) (ACUTE ONLY): 26 min  Charges:    $Therapeutic Activity: 23-37 mins PT General Charges $$ ACUTE PT VISIT: 1 Visit                    Harrel Carina, DPT, CLT  Acute Rehabilitation Services Office: [phone] (Secure chat preferred)    Kelli Hudson 12/03/2022, 4:56 PM

## 2022-12-03 NOTE — TOC Progression Note (Addendum)
Transition of Care [facility]) - Progression Note    Patient Details  Name: Kelli Hudson MRN: [id number] Date of Birth: [birth year]  [facility] CM/SW Contact  Mearl Latin, LCSW Phone Number: 12/03/2022, 8:31 AM  Clinical Narrative:    8:31 AM-CSW received call from Kathlene November at [facility] and he stated he is waiting on his DON to arrive and will hopefully have an answer by 10am.   Pasrr added to Fl2.   11am-CSW received confirmation from [place] at [facility] that they can accept patient and start the insurance process. CSW updated patient's mother and she requested to wait to hear back from [place] before proceeding.   11:30am-CSW received request from patient's mother to go ahead and proceed with [facility] instead of waiting on [facility] so as not to delay discharge. CSW updated Kathlene November who confirmed that patient can admit today even though insurance approval has not yet been received.    CSW emailed and faxed DC Summary to [place] as requested. CSW confirmed with patient's mother that patient will need PTAR for transport.   Expected Discharge Plan: Skilled Nursing Facility Barriers to Discharge: Continued Medical Work up, English as a second language teacher, SNF Pending bed offer  Expected Discharge Plan and Services     Post Acute Care Choice: Skilled Nursing Facility                                         Social Determinants of Health (SDOH) Interventions SDOH Screenings   Food Insecurity: No Food Insecurity (10/03/2022)  Housing: Low Risk  (10/03/2022)  Transportation Needs: No Transportation Needs (10/03/2022)  Utilities: Not At Risk (10/03/2022)  Alcohol Screen: Low Risk  (05/29/2021)  Depression (PHQ2-9): Low Risk  (05/29/2021)  Recent Concern: Depression (PHQ2-9) - Medium Risk (04/10/2021)  Financial Resource Strain: Low Risk  (02/13/2022)   Received from [facility]  Social Connections: Unknown (06/17/2021)   Received from [facility]  Stress: No Stress Concern Present (09/16/2022)   Received from [facility]  Tobacco Use: Low Risk  (11/27/2022)    Readmission Risk Interventions    11/27/2022    2:33 PM 05/08/2022    9:45 AM 03/24/2022    3:33 PM  Readmission Risk Prevention Plan  Transportation Screening Complete Complete Complete  PCP or Specialist Appt within 3-5 Days Not Complete    HRI or Home Care Consult Complete    Social Work Consult for Recovery Care Planning/Counseling Complete    Palliative Care Screening Not Applicable    Medication Review Oceanographer) Complete Complete Complete  PCP or Specialist appointment within 3-5 days of discharge  Complete Complete  HRI or Home Care Consult  Complete Complete  SW Recovery Care/Counseling Consult  Complete Complete  Palliative Care Screening  Not Applicable Not Applicable  Skilled Nursing Facility  Not Applicable Not Applicable

## 2022-12-03 NOTE — [place] FL2 (Signed)
[place] MEDICAID FL2 LEVEL OF CARE FORM     IDENTIFICATION  Patient Name: Kelli Hudson Birthdate: [birth year] Sex: female Admission Date (Current Location): 11/26/2022  [facility] and [place] Number:  Producer, television/film/video and Address:  [facility], [address] [place], [place] [address]      Provider Number:    Attending Physician Name and Address:  Elgergawy, Leana Roe, MD  Relative Name and Phone Number:       Current Level of Care: SNF Recommended Level of Care: Skilled Nursing Facility Prior Approval Number:    Date Approved/Denied:   PASRR Number: [id number] E expires 01/01/23  Discharge Plan: SNF    Current Diagnoses: Patient Active Problem List   Diagnosis Date Noted   Abscess of thigh 11/28/2022   C. 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   Hypokalemia 11/27/2022   Effusion, left knee 11/05/2022   MDD (major depressive disorder), recurrent episode, moderate (HCC) 10/16/2022   Necrotizing fasciitis of pelvic region and thigh (HCC) 10/08/2022   Necrotizing fasciitis of lower leg (HCC) 10/08/2022   Necrotizing fasciitis (HCC) 10/08/2022   Sepsis due to cellulitis (HCC) 10/03/2022   Cellulitis of lower extremity 10/03/2022   Multinodular goiter 06/18/2022   Malnutrition of moderate degree 04/18/2022   Intractable vomiting with nausea 04/17/2022   DKA (diabetic ketoacidosis) (HCC) 03/04/2022   Refractory nausea and vomiting 12/25/2021   Diabetic gastroparesis (HCC) 12/07/2021   Intractable nausea and vomiting 10/09/2021   Drug-seeking behavior 09/21/2021   Essential hypertension 08/25/2021   GERD without esophagitis 08/25/2021   Hematemesis    Prolonged Q-T interval on ECG    Nausea and vomiting 07/20/2021   Hypomagnesemia 04/21/2021   Ulcerated, foot, left, with fat layer exposed (HCC) 04/20/2021   Uncontrolled type 1 diabetes mellitus with hyperglycemia, with  long-term current use of insulin (HCC) 12/18/2020   DM type 1 (diabetes mellitus, type 1) (HCC) 12/18/2020   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) 11/09/2020   Diabetic retinopathy of both eyes associated with type 1 diabetes mellitus (HCC) 09/24/2020   Diabetic gastroparesis associated with type 1 diabetes mellitus (HCC) 08/12/2020   AKI (acute kidney injury) (HCC) 07/25/2020   Generalized abdominal pain 07/23/2020   Diabetic nephropathy associated with type 1 diabetes mellitus (HCC) 06/27/2020   Sepsis (HCC) 05/27/2020   HLD (hyperlipidemia) 05/23/2020   Sinus tachycardia 05/09/2020   Anemia 04/20/2020   Depression with anxiety 07/05/2019   Diabetic polyneuropathy associated with type 1 diabetes mellitus (HCC) 09/24/2017   Gastroparesis    Goiter 10/05/2009   Hypertension associated with diabetes (HCC) 10/05/2009    Orientation RESPIRATION BLADDER Height & Weight     Self, Time, Situation, Place  Normal Continent Weight: 165 lb 5.5 oz (75 kg) Height:     BEHAVIORAL SYMPTOMS/MOOD NEUROLOGICAL BOWEL NUTRITION STATUS      Continent Diet (See DC Summary)  AMBULATORY STATUS COMMUNICATION OF NEEDS Skin   Limited Assist Verbally Surgical wounds, Skin abrasions (Closed incision on thight (gauze dressing))                       Personal Care Assistance Level of Assistance  Bathing, Dressing Bathing Assistance: Maximum assistance Feeding assistance: Independent Dressing Assistance: Maximum assistance     Functional Limitations Info  Sight Sight Info: Impaired Hearing Info: Adequate Speech Info: Adequate    SPECIAL CARE FACTORS FREQUENCY  PT (By licensed  PT), OT (By licensed OT)     PT Frequency: 5X/ Week OT Frequency: 5X/ Week            Contractures      Additional Factors Info  Code Status, Allergies, Isolation Precautions, Insulin Sliding Scale Code Status Info: Full Allergies Info: Ibuprofen; Lisinopril   Insulin Sliding Scale  Info: See dc summary Isolation Precautions Info: ESBL     Current Medications (12/03/2022):  This is the current hospital active medication list Current Facility-Administered Medications  Medication Dose Route Frequency Provider Last Rate Last Admin   acetaminophen (TYLENOL) tablet 650 mg  650 mg Oral Q6H PRN Elgergawy, Leana Roe, MD   650 mg at 11/30/22 0303   Or   acetaminophen (TYLENOL) suppository 650 mg  650 mg Rectal Q6H PRN Elgergawy, Leana Roe, MD       alum & mag hydroxide-simeth (MAALOX/MYLANTA) 200-200-20 MG/5ML suspension 30 mL  30 mL Oral Q6H PRN Janalyn Shy, Subrina, MD       amLODipine (NORVASC) tablet 10 mg  10 mg Oral Daily Leroy Sea, MD   10 mg at 12/02/22 9604   ascorbic acid (VITAMIN C) tablet 1,000 mg  1,000 mg Oral Daily Elgergawy, Leana Roe, MD   1,000 mg at 12/02/22 0910   carvedilol (COREG) tablet 6.25 mg  6.25 mg Oral BID WC Leroy Sea, MD   6.25 mg at 12/02/22 1813   famotidine (PEPCID) tablet 20 mg  20 mg Oral BID Leroy Sea, MD   20 mg at 12/02/22 2201   fentaNYL (DURAGESIC) 50 MCG/HR 1 patch  1 patch Transdermal Q72H Shahmehdi, Guadalupe Maple A, MD   1 patch at 12/02/22 0910   gabapentin (NEURONTIN) capsule 300 mg  300 mg Oral Q8H Elgergawy, Leana Roe, MD   300 mg at 12/03/22 0555   hydrALAZINE (APRESOLINE) injection 10 mg  10 mg Intravenous Q4H PRN Elgergawy, Leana Roe, MD   10 mg at 12/02/22 0523   HYDROmorphone (DILAUDID) injection 1 mg  1 mg Intravenous Q8H PRN Leroy Sea, MD   1 mg at 12/03/22 5409   insulin aspart (novoLOG) injection 0-9 Units  0-9 Units Subcutaneous TID WC Elgergawy, Leana Roe, MD   2 Units at 12/02/22 1814   insulin detemir (LEVEMIR) injection 4 Units  4 Units Subcutaneous Daily Leroy Sea, MD   4 Units at 12/02/22 0911   metoCLOPramide (REGLAN) injection 10 mg  10 mg Intravenous Q8H Leroy Sea, MD   10 mg at 12/03/22 0555   nutrition supplement (JUVEN) (JUVEN) powder packet 1 packet  1 packet Oral BID BM Pham, Minh  Q, RPH-CPP   1 packet at 12/02/22 1350   ondansetron (ZOFRAN) injection 4 mg  4 mg Intravenous Q8H PRN Elgergawy, Leana Roe, MD   4 mg at 11/29/22 1133   Oral care mouth rinse  15 mL Mouth Rinse PRN Elgergawy, Leana Roe, MD       sucralfate (CARAFATE) tablet 1 g  1 g Oral TID WC & HS Elgergawy, Leana Roe, MD   1 g at 12/02/22 2201   vancomycin (VANCOCIN) capsule 125 mg  125 mg Oral QID Daiva Eves, Lisette Grinder, MD   125 mg at 12/02/22 2201   zinc sulfate capsule 220 mg  220 mg Oral Daily Elgergawy, Leana Roe, MD   220 mg at 12/02/22 0911     Discharge Medications: Please see discharge summary for a list of discharge medications.  Relevant Imaging Results:  Relevant Lab  Results:   Additional Information SSN: [id number]  Mearl Latin, LCSW

## 2022-12-03 NOTE — Discharge Summary (Addendum)
Physician Discharge Summary  Makai Cappa ONG:[id number] DOB: [birth year] DOA: 11/26/2022  PCP: Norm Salt, PA  Admit date: 11/26/2022 Discharge date: 12/03/2022  Admitted From: (SNF) Disposition:  (SNF)  Recommendations for Outpatient Follow-up:  Follow up with orthopedic Dr. Aldean Baker as an outpatient regarding post bilateral AKA care. Please obtain BMP/CBC in one week  Diet recommendation: Heart Healthy / Carb Modified , to have snacks, and sometimes double portion if needed, continue with supplements including Juven and Glucerna  Brief/Interim Summary:  32 y.o. 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 Was discharged to SNF. Now being admitted from SNF for nausea vomiting some ongoing lower extremity stump pain.   Sepsis on admission, due to C. difficile colitis Acute on chronic nausea vomiting due to gastroparesis, initially had some diarrhea but no bowel movements in the last 2 days.    Abdominal exam benign.  Supportive care, scheduled Reglan, as needed antiemetics, bowel rest, IV fluids, x 2 shows no ileus, repeat abdominal x-ray on 12/02/2022 stable, abdominal exam benign, diarrhea resolved the day she was admitted and having formed stool, 10 days of oral vancomycin per ID.  Stop date 10/21, clinically all nausea vomiting diarrhea has resolved.  bilateral AKA, infection rule out in the stump area -  CTA images reviewed, seen by Dr. Lajoyce Corners, case discussed with ID physician Dr. Algis Liming.  Orthopedics is of the opinion that this is postop lymphatic fluid collection, ID does not think patient has any systemic or stump infection and has discontinued all parental  antibiotics.  Monitor inflammatory markers and clinically.  Seen by Dr. Lajoyce Corners again on 12/01/2022, no changes.   Anemia of chronic disease with acute fall due to IV fluids for hydration and some right AKA stump site blood loss.  Transfused 2 units of packed RBCs on 12/01/2022, hold Lovenox, Pepcid as avoiding PPI due to possible C. difficile colonization, no signs of active bleeding, transfusion CBC stable on 12/02/2022, seen again by orthopedic surgeon Dr. Lajoyce Corners on 11/30/29 for stump stable.  Continue to wear stump shrinker.   HX of narcotic seeking behavior. -Limit IV narcotics during hospital stay, she will be discharged on fentanyl patch and p.o. hydromorphone.    GERD.  Supportive care for now.   Hypokalemia and hypomagnesemia.  Replaced.   Hypertension.  On Coreg, add Norvasc and monitor.   Diabetic peripheral neuropathy.  On Neurontin.   For memory per patient and family members.  Recent staples over the stumps hence no MRI, stable/non acute CT head, B12, TSH, RPR and HIV screen, remains AAO x 3 here, postdischarge outpatient neurology follow-up.   DM type I.  Not on DKA.  Low-dose Semglee and sliding scale.  Monitor  Patient has been reporting some worsening edema in the lower pelvic area upper thigh area, she was seen and examined with the presence of female chaperone her RN Baxter Hire, she does appear to be having some edema in upper thigh, and private pelvic area, mainly secondary to shrinker bilaterally applied for her AKI procedure, so she will be given Lasix as needed to help with the symptoms.       Discharge Diagnoses:  Principal Problem:   Nausea and vomiting Active Problems:   Sepsis (HCC)   Hypokalemia   DM type 1 (diabetes mellitus, type 1) (  HCC)   Essential hypertension   Drug-seeking behavior   Uncontrolled type 1 diabetes mellitus with hyperglycemia, with long-term current use of insulin (HCC)   Hypertension associated with diabetes (HCC)   Gastroparesis    Diabetic nephropathy associated with type 1 diabetes mellitus (HCC)   Diabetic retinopathy of both eyes associated with type 1 diabetes mellitus (HCC)   GERD without esophagitis   Diabetic gastroparesis (HCC)   Abscess of thigh   C. difficile diarrhea   S/P AKA (above knee amputation) bilateral [facility])    Discharge Instructions  Discharge Instructions     Diet - low sodium heart healthy   Complete by: As directed    Discharge instructions   Complete by: As directed    Follow with Primary MD /SNF physician  Get CBC, CMP, checked  by Primary MD next visit.    Disposition SNF   Diet: heart/carb modified   On your next visit with your primary care physician please Get Medicines reviewed and adjusted.   Please request your Prim.MD to go over all Hospital Tests and Procedure/Radiological results at the follow up, please get all Hospital records sent to your Prim MD by signing hospital release before you go home.   If you experience worsening of your admission symptoms, develop shortness of breath, life threatening emergency, suicidal or homicidal thoughts you must seek medical attention immediately by calling 911 or calling your MD immediately  if symptoms less severe.  You Must read complete instructions/literature along with all the possible adverse reactions/side effects for all the Medicines you take and that have been prescribed to you. Take any new Medicines after you have completely understood and accpet all the possible adverse reactions/side effects.   Do not drive, operating heavy machinery, perform activities at heights, swimming or participation in water activities or provide baby sitting services if your were admitted for syncope or siezures until you have seen by Primary MD or a Neurologist and advised to do so again.  Do not drive when taking Pain medications.    Do not take more than prescribed Pain, Sleep and Anxiety Medications  Special Instructions: If you  have smoked or chewed Tobacco  in the last 2 yrs please stop smoking, stop any regular Alcohol  and or any Recreational drug use.  Wear Seat belts while driving.   Please note  You were cared for by a hospitalist during your hospital stay. If you have any questions about your discharge medications or the care you received while you were in the hospital after you are discharged, you can call the unit and asked to speak with the hospitalist on call if the hospitalist that took care of you is not available. Once you are discharged, your primary care physician will handle any further medical issues. Please note that NO REFILLS for any discharge medications will be authorized once you are discharged, as it is imperative that you return to your primary care physician (or establish a relationship with a primary care physician if you do not have one) for your aftercare needs so that they can reassess your need for medications and monitor your lab values.High-protein diet, protein supplements 3 times a day.   Discharge wound care:   Complete by: As directed    BID dressing at bilateral AKA site. Just ABD and kerlix then the stump compression.   Increase activity slowly   Complete by: As directed       Allergies as of 12/03/2022  Reactions   Ibuprofen Other (See Comments)   07/23/22 - Worsening kidney function after 3 doses of ibuprofen 400mg .  Creatinine increased from 1.3 to 2.  Creatinine returned to normal after stopping.   Lisinopril Cough, Other (See Comments), Itching, Rash   Breakout in rash        Medication List     STOP taking these medications    DULoxetine 60 MG capsule Commonly known as: CYMBALTA   omeprazole 40 MG capsule Commonly known as: PRILOSEC   traZODone 50 MG tablet Commonly known as: DESYREL       TAKE these medications    acetaminophen 325 MG tablet Commonly known as: TYLENOL Take 2 tablets (650 mg total) by mouth every 6 (six) hours as needed for  mild pain, fever or headache.   amLODipine 10 MG tablet Commonly known as: NORVASC Take 1 tablet (10 mg total) by mouth daily. Start taking on: December 04, 2022   ascorbic acid 1000 MG tablet Commonly known as: VITAMIN C Take 1 tablet (1,000 mg total) by mouth daily.   carvedilol 6.25 MG tablet Commonly known as: COREG Take 1 tablet (6.25 mg total) by mouth 2 (two) times daily with a meal.   Dexcom G6 Sensor Misc 1 Device by Does not apply route as directed.   Dexcom G6 Transmitter Misc 1 Device by Does not apply route as directed.   famotidine 20 MG tablet Commonly known as: PEPCID Take 1 tablet (20 mg total) by mouth 2 (two) times daily.   feeding supplement (GLUCERNA SHAKE) Liqd Take 237 mLs by mouth 3 (three) times daily between meals.   nutrition supplement (JUVEN) Pack Take 1 packet by mouth 2 (two) times daily between meals.   fentaNYL 50 MCG/HR Commonly known as: DURAGESIC Place 1 patch onto the skin every 3 (three) days.   furosemide 20 MG tablet Commonly known as: LASIX Take 1 tablet (20 mg total) by mouth daily as needed for edema or fluid.   gabapentin 300 MG capsule Commonly known as: NEURONTIN Take 1 capsule (300 mg total) by mouth every 8 (eight) hours.   HYDROmorphone 2 MG tablet Commonly known as: DILAUDID Take 1 tablet (2 mg total) by mouth every 4 (four) hours as needed for severe pain (pain score 7-10) or moderate pain (pain score 4-6). What changed:  how much to take when to take this   insulin aspart 100 UNIT/ML injection Commonly known as: novoLOG Inject 0-9 Units into the skin 3 (three) times daily with meals.   Insulin Pen Needle 32G X 4 MM Misc 1 Device by Does not apply route in the morning, at noon, in the evening, and at bedtime.   Levemir FlexTouch 100 UNIT/ML FlexTouch Pen Generic drug: insulin detemir Inject 4 Units into the skin 2 (two) times daily.   MAGNESIUM PO Take 1 tablet by mouth daily.   methocarbamol 500 MG  tablet Commonly known as: ROBAXIN Take 500 mg by mouth 3 (three) times daily.   metoCLOPramide 10 MG tablet Commonly known as: REGLAN Take 1 tablet (10 mg total) by mouth every 8 (eight) hours.   multivitamin with minerals Tabs tablet Take 1 tablet by mouth daily.   Nexplanon 68 MG Impl implant Generic drug: etonogestrel 1 each by Subdermal route continuous.   OVER THE COUNTER MEDICATION Take 1 tablet by mouth daily. Nervive   sucralfate 1 g tablet Commonly known as: CARAFATE Take 1 g by mouth in the morning, at noon, and at bedtime.  vancomycin 125 MG capsule Commonly known as: VANCOCIN Take 1 capsule (125 mg total) by mouth 4 (four) times daily. Start taking on: December 08, 2022   zinc sulfate 220 (50 Zn) MG capsule Take 1 capsule (220 mg total) by mouth daily for 6 days.               Discharge Care Instructions  (From admission, onward)           Start     Ordered   12/03/22 0000  Discharge wound care:       Comments: BID dressing at bilateral AKA site. Just ABD and kerlix then the stump compression.   12/03/22 1311            Follow-up Information     Nadara Mustard, MD Follow up.   Specialty: Orthopedic Surgery Contact information: [address] [place] [address] [phone]                Allergies  Allergen Reactions   Ibuprofen Other (See Comments)    07/23/22 - Worsening kidney function after 3 doses of ibuprofen 400mg .  Creatinine increased from 1.3 to 2.  Creatinine returned to normal after stopping.   Lisinopril Cough, Other (See Comments), Itching and Rash    Breakout in rash    Consultations: Ortho ID   Procedures/Studies: DG Abd Portable 1V  Result Date: 12/02/2022 CLINICAL DATA:  Small-bowel obstruction EXAM: PORTABLE ABDOMEN - 1 VIEW COMPARISON:  Abdominal x-ray 10/10/2022. CT abdomen and pelvis 11/28/2022. FINDINGS: Generator and wires are seen overlying the lower abdomen, unchanged. Bowel-gas pattern is  nonobstructive. No suspicious calcifications are seen. No acute fractures are seen. IMPRESSION: Nonobstructive bowel-gas pattern. Electronically Signed   By: Darliss Cheney M.D.   On: 12/02/2022 01:52   CT HEAD WO CONTRAST ( )  Result Date: 11/29/2022 CLINICAL DATA:  32 year old female with complicated diabetes. Memory loss. EXAM: CT HEAD WITHOUT CONTRAST TECHNIQUE: Contiguous axial images were obtained from the base of the skull through the vertex without intravenous contrast. RADIATION DOSE REDUCTION: This exam was performed according to the departmental dose-optimization program which includes automated exposure control, adjustment of the mA and/or kV according to patient size and/or use of iterative reconstruction technique. COMPARISON:  Head CT 11/06/2021. FINDINGS: Brain: Cerebral volume remains within normal limits. No midline shift, ventriculomegaly, mass effect, evidence of mass lesion, intracranial hemorrhage or evidence of cortically based acute infarction. Gray-white matter differentiation is within normal limits throughout the brain. Vascular: No suspicious intracranial vascular hyperdensity. Skull: Stable, negative. Sinuses/Orbits: Visualized paranasal sinuses and mastoids are clear. Other: Postoperative changes to the right globe since last year. No acute orbit or scalp soft tissue finding. IMPRESSION: 1. Stable and normal noncontrast CT appearance of the brain. 2. Postoperative changes to the right globe since last year. Electronically Signed   By: Odessa Fleming M.D.   On: 11/29/2022 13:40   CT ABDOMEN PELVIS WO CONTRAST  Result Date: 11/28/2022 CLINICAL DATA:  Acute generalized abdominal pain status post bilateral amputation. EXAM: CT ABDOMEN AND PELVIS WITHOUT CONTRAST TECHNIQUE: Multidetector CT imaging of the abdomen and pelvis was performed following the standard protocol without IV contrast. RADIATION DOSE REDUCTION: This exam was performed according to the departmental dose-optimization  program which includes automated exposure control, adjustment of the mA and/or kV according to patient size and/or use of iterative reconstruction technique. COMPARISON:  November 26, 2022. FINDINGS: Lower chest: No acute abnormality. Hepatobiliary: No focal liver abnormality is seen. No gallstones, gallbladder wall thickening,  or biliary dilatation. Pancreas: Unremarkable. No pancreatic ductal dilatation or surrounding inflammatory changes. Spleen: Normal in size without focal abnormality. Adrenals/Urinary Tract: Adrenal glands are unremarkable. Kidneys are normal, without renal calculi, focal lesion, or hydronephrosis. Bladder is unremarkable. Stomach/Bowel: Stomach is unremarkable. The appendix appears normal. There is no evidence of bowel obstruction. There remains mild wall thickening of descending and sigmoid colon which may represent lack of distension, but infectious or inflammatory colitis cannot be excluded. Vascular/Lymphatic: No definite vascular abnormality. No significantly enlarged adenopathy is noted. Reproductive: Uterus and bilateral adnexa are unremarkable. Other: No ascites or hernia is noted.  Mild anasarca is noted. Musculoskeletal: No acute or significant osseous findings. IMPRESSION: There remains mild wall thickening of descending and sigmoid colon which may represent lack of distension, but infectious or inflammatory colitis cannot be excluded. Probable mild anasarca. Electronically Signed   By: Lupita Raider M.D.   On: 11/28/2022 12:43   CT EXTREM LOWER W CM BIL  Result Date: 11/26/2022 CLINICAL DATA:  Soft tissue infection suspected, lower leg. X-ray done. Status post bilateral above the knee amputation 1 week ago. Patient has been vomiting all morning. EXAM: CT OF THE LOWER BILATERAL EXTREMITY WITH CONTRAST TECHNIQUE: Multidetector CT imaging of the lower bilateral extremity was performed according to the standard protocol following intravenous contrast administration. RADIATION DOSE  REDUCTION: This exam was performed according to the departmental dose-optimization program which includes automated exposure control, adjustment of the mA and/or kV according to patient size and/or use of iterative reconstruction technique. COMPARISON:  CT right tibia and fibula and right femur 11/01/2022, CT left tibia and fibula and left femur 11/01/2022 CONTRAST:  OMNIPAQUE IOHEXOL 300 MG/ML  SOLN FINDINGS: Bones/Joint/Cartilage Mild bilateral superolateral acetabular degenerative osteophytosis. Minimal superior pubic symphysis joint space narrowing. Interval postsurgical changes of bilateral above-the-knee amputation. Starting at the superior aspect of the femoral head, the right femur measures up to approximately 30.8 cm in the left femur measures up to approximately 30.3 cm. The distal femoral diaphyseal amputation margins are sharp. There are surgical skin staples at the distal aspect of each soft tissue distal amputation stump. Right lower extremity: There is a low-density collection just distal to the amputation stump and extending up the distal lateral greater than medial thigh (axial series 2 images 58 through 73 and axial series 5 images 1 through 17). This collection is of moderate density and contains moderate scattered air. There is mild peripheral rim enhancement. This is suspicious for an abscess, and appears to extend to the prior surgical incision at the distal stump, possibly draining to the skin surface (coronal series 7, images 43-45). The irregular size of this collection makes measurements difficult, however it appears to measure up to approximately 11 cm in transverse dimension. Extends up the lateral thigh approximately 8.5 cm (as measured on coronal series 3, image 62) and the medial thigh up to approximately 4.6 cm. No cortical erosion to indicate acute osteomyelitis. Left lower extremity: Simple are to the contralateral side, there is a low-density collection just distal to the  amputation stump extending up the distal lateral greater than medial thigh (axial series 2 images 47 through 68 and axial series 5 images 1 through 12). This collection is similar moderate density and contains more mild scattered air compared to the contralateral leg. There is mild peripheral rim enhancement. A thin tract is seen from this fluid draining to the distal anterior amputation stump surgical incision at the skin surface (coronal series 3 images 42 through 45,  axial series 5 images 13 through 15). This irregular collection measures up to approximately 10 cm in greatest transverse dimension. It extends up the lateral thigh approximately 8 cm and up the medial thigh approximately 5.5 cm (as measured on coronal series 3 images 57 and 49, respectively). Ligaments Suboptimally assessed by CT. Muscles and Tendons There is heterogeneous partially rim enhancing low-density within the medial aspect of the distal right sartorius muscle that again extends to the medial skin surface (axial series 2, images 52 through 66). The lowest density, rim enhancing portion measures up to 1.5 x 1.2 x 6.0 cm (transverse by AP by craniocaudal, axial series 2, image 54 and coronal series 3, image 54), decreased from prior. This again may extend and drained to the distal medial thigh surgical incision site (axial series 2 images 57 through 64). There is fluid tracking up the lateral right thigh in between the lateral subcutaneous fat in the vastus lateralis muscle, overall measuring up to approximately 3 x 9 x 19 cm (transverse by AP by craniocaudal, axial series 2, image 47 and coronal series 3, image 63). There is similar fluid tracking along the lateral left thigh in between the deep aspect of the subcutaneous fat in the vastus lateralis muscle, measuring up to approximately 2 cm in transverse dimension and 18 cm in craniocaudal dimension. This is seen on the coronal images but is off the lateral plane of view of the axial series  2. There is interval decrease in now minimal complex fluid anteromedial to the sartorius and vastus medialis muscles that minimally extends through the distal anteromedial thigh skin surface (axial series 2, images 44 through 66). Soft tissues There is diffuse moderate subcutaneous fat edema and swelling. Fluid distends the rectum, and there is moderate rectal wall thickening as seen on today's CT abdomen and pelvis. IMPRESSION: Compared to 11/01/2022: 1. Interval postsurgical changes of revised, now bilateral above-the-knee amputation. 2. There are bilateral low-density collections just distal to the amputation stumps extending up the distal lateral greater than medial thigh. These collections contain moderate scattered air and are suspicious for abscesses. These collections appear to extend to the prior surgical incision at the distal stump, possibly draining to the skin surface. 3. There is complex fluid tracking between the lateral thigh subcutaneous fat and the vastus lateralis muscles bilaterally, as above. 4. Interval decrease in size of small complex fluid collections superficial to the anteromedial aspect of the right sartorius muscle and the junction of the left sartorius and vastus medialis muscles, likely minimally draining to the anteromedial thigh skin surface. Electronically Signed   By: Neita Garnet M.D.   On: 11/26/2022 14:56   DG Chest Portable 1 View  Result Date: 11/26/2022 CLINICAL DATA:  Tachycardia, vomiting, diaphoretic EXAM: PORTABLE CHEST 1 VIEW COMPARISON:  10/08/2022 FINDINGS: Diffuse symmetric and bilateral interstitial opacities favored to be interstitial edema/early CHF. Mild cardiac enlargement. No large effusion or pneumothorax. Trachea midline. Monitor leads overlie the chest. IMPRESSION: Cardiomegaly with diffuse interstitial edema pattern. Electronically Signed   By: Judie Petit.  Shick M.D.   On: 11/26/2022 14:54   CT ABDOMEN PELVIS W CONTRAST  Result Date: 11/26/2022 CLINICAL  DATA:  Vomiting and generalized abdominal pain 1 week after bilateral above knee amputation. EXAM: CT ABDOMEN AND PELVIS WITH CONTRAST TECHNIQUE: Multidetector CT imaging of the abdomen and pelvis was performed using the standard protocol following bolus administration of intravenous contrast. RADIATION DOSE REDUCTION: This exam was performed according to the departmental dose-optimization program which includes automated exposure  control, adjustment of the mA and/or kV according to patient size and/or use of iterative reconstruction technique. CONTRAST:  OMNIPAQUE IOHEXOL 300 MG/ML  SOLN COMPARISON:  April 05, 2022. FINDINGS: Lower chest: Minimal bibasilar subsegmental atelectasis or edema is noted. Hepatobiliary: No focal liver abnormality is seen. No gallstones, gallbladder wall thickening, or biliary dilatation. Pancreas: Unremarkable. No pancreatic ductal dilatation or surrounding inflammatory changes. Spleen: Normal in size without focal abnormality. Adrenals/Urinary Tract: Adrenal glands are unremarkable. Kidneys are normal, without renal calculi, focal lesion, or hydronephrosis. Bladder is unremarkable. Stomach/Bowel: The stomach is unremarkable. The appendix appears normal. There is no evidence of bowel obstruction. Moderate wall thickening of the colon and rectum is noted most consistent with infectious or inflammatory colitis. Vascular/Lymphatic: No significant vascular findings are present. No enlarged abdominal or pelvic lymph nodes. Reproductive: Prostate is unremarkable. Other: No ascites or hernia is noted.  Mild anasarca is noted. Musculoskeletal: No acute or significant osseous findings. IMPRESSION: Moderate diffuse colonic wall thickening is noted suggesting infectious or inflammatory colitis. Minimal bibasilar subsegmental atelectasis or edema is noted. Electronically Signed   By: Lupita Raider M.D.   On: 11/26/2022 14:07      Subjective:  No significant events overnight as  discussed with staff, she denies any complaints today Discharge Exam: Vitals:   12/03/22 0832 12/03/22 1202  BP: 131/84 137/89  Pulse: (!) 106 (!) 108  Resp: 15 18  Temp: 98.4 F (36.9 C) 98.1 F (36.7 C)  SpO2: 97% 98%   Vitals:   12/02/22 2328 12/03/22 0323 12/03/22 0832 12/03/22 1202  BP: 118/69 (!) 141/94 131/84 137/89  Pulse: (!) 102 (!) 101 (!) 106 (!) 108  Resp: 18 16 15 18   Temp: 98.3 F (36.8 C) 98 F (36.7 C) 98.4 F (36.9 C) 98.1 F (36.7 C)  TempSrc: Oral Oral Oral Oral  SpO2:   97% 98%  Weight:        General: Pt is alert, awake, not in acute distress Cardiovascular: RRR, S1/S2 +, no rubs, no gallops Respiratory: CTA bilaterally, no wheezing, no rhonchi Abdominal: Soft, NT, ND, bowel sounds + Extremities: no edema, no cyanosis, bilateral AKA, with stump shrinker    The results of significant diagnostics from this hospitalization (including imaging, microbiology, ancillary and laboratory) are listed below for reference.     Microbiology: Recent Results (from the past 240 hour(s))  Culture, blood (routine x 2)     Status: Abnormal   Collection Time: 11/26/22 10:59 AM   Specimen: BLOOD LEFT HAND  Result Value Ref Range Status   Specimen Description   Final    BLOOD LEFT HAND Performed at [facility], [address], [place], [place] [address]    Special Requests   Final    BOTTLES DRAWN AEROBIC ONLY Blood Culture adequate volume Performed at [facility], [address], [place], [place] [address]    Culture  Setup Time   Final    GRAM POSITIVE COCCI IN CLUSTERS AEROBIC BOTTLE ONLY CRITICAL RESULT CALLED TO, READ BACK BY AND VERIFIED WITH: COURTNEY EANES [phone], VIRAY,J Organism ID to follow    Culture (A)  Final    STAPHYLOCOCCUS HOMINIS THE SIGNIFICANCE OF ISOLATING THIS ORGANISM FROM A SINGLE SET OF BLOOD CULTURES WHEN MULTIPLE SETS ARE DRAWN IS UNCERTAIN. PLEASE NOTIFY THE MICROBIOLOGY DEPARTMENT WITHIN ONE WEEK IF SPECIATION AND  SENSITIVITIES ARE REQUIRED. Performed at [facility], [address] [place], [place] [address]    Report Status 11/28/2022  FINAL  Final  Culture, blood (routine x 2)     Status: None   Collection Time: 11/26/22 10:59 AM   Specimen: BLOOD  Result Value Ref Range Status   Specimen Description BLOOD LT HAND  Final   Special Requests   Final    BOTTLES DRAWN AEROBIC AND ANAEROBIC Blood Culture adequate volume   Culture   Final    NO GROWTH 5 DAYS Performed at [facility], [address], [place], [place] [address]    Report Status 12/01/2022 FINAL  Final  Blood Culture ID Panel (Reflexed)     Status: Abnormal   Collection Time: 11/26/22 10:59 AM  Result Value Ref Range Status   Enterococcus faecalis NOT DETECTED NOT DETECTED Final   Enterococcus Faecium NOT DETECTED NOT DETECTED Final   Listeria monocytogenes NOT DETECTED NOT DETECTED Final   Staphylococcus species DETECTED (A) NOT DETECTED Final    Comment: CRITICAL RESULT CALLED TO, READ BACK BY AND VERIFIED WITH: L. POOLE PHARMD, AT 1057 11/27/22 D. VANHOOK    Staphylococcus aureus (BCID) NOT DETECTED NOT DETECTED Final   Staphylococcus epidermidis NOT DETECTED NOT DETECTED Final   Staphylococcus lugdunensis NOT DETECTED NOT DETECTED Final   Streptococcus species NOT DETECTED NOT DETECTED Final   Streptococcus agalactiae NOT DETECTED NOT DETECTED Final   Streptococcus pneumoniae NOT DETECTED NOT DETECTED Final   Streptococcus pyogenes NOT DETECTED NOT DETECTED Final   A.calcoaceticus-baumannii NOT DETECTED NOT DETECTED Final   Bacteroides fragilis NOT DETECTED NOT DETECTED Final   Enterobacterales NOT DETECTED NOT DETECTED Final   Enterobacter cloacae complex NOT DETECTED NOT DETECTED Final   Escherichia coli NOT DETECTED NOT DETECTED Final   Klebsiella aerogenes NOT DETECTED NOT DETECTED Final   Klebsiella oxytoca NOT DETECTED NOT DETECTED Final   Klebsiella pneumoniae NOT DETECTED NOT DETECTED Final   Proteus  species NOT DETECTED NOT DETECTED Final   Salmonella species NOT DETECTED NOT DETECTED Final   Serratia marcescens NOT DETECTED NOT DETECTED Final   Haemophilus influenzae NOT DETECTED NOT DETECTED Final   Neisseria meningitidis NOT DETECTED NOT DETECTED Final   Pseudomonas aeruginosa NOT DETECTED NOT DETECTED Final   Stenotrophomonas maltophilia NOT DETECTED NOT DETECTED Final   Candida albicans NOT DETECTED NOT DETECTED Final   Candida auris NOT DETECTED NOT DETECTED Final   Candida glabrata NOT DETECTED NOT DETECTED Final   Candida krusei NOT DETECTED NOT DETECTED Final   Candida parapsilosis NOT DETECTED NOT DETECTED Final   Candida tropicalis NOT DETECTED NOT DETECTED Final   Cryptococcus neoformans/gattii NOT DETECTED NOT DETECTED Final    Comment: Performed at [facility], [address] [place], [place] [address]  C Difficile Quick Screen w PCR reflex     Status: Abnormal   Collection Time: 11/26/22 11:01 AM   Specimen: STOOL  Result Value Ref Range Status   C Diff antigen POSITIVE (A) NEGATIVE Final   C Diff toxin NEGATIVE NEGATIVE Final   C Diff interpretation Results are indeterminate. See PCR results.  Final    Comment: Performed at [facility], [address] [place], [place] [address]  Gastrointestinal Panel by PCR , Stool     Status: None   Collection Time: 11/26/22 11:01 AM   Specimen: STOOL  Result Value Ref Range Status   Campylobacter species NOT DETECTED NOT DETECTED Final   Plesimonas shigelloides NOT DETECTED NOT DETECTED Final   Salmonella species NOT DETECTED NOT DETECTED Final   Yersinia enterocolitica NOT DETECTED NOT DETECTED Final  Vibrio species NOT DETECTED NOT DETECTED Final   Vibrio cholerae NOT DETECTED NOT DETECTED Final   Enteroaggregative E coli (EAEC) NOT DETECTED NOT DETECTED Final   Enteropathogenic E coli (EPEC) NOT DETECTED NOT DETECTED Final   Enterotoxigenic E coli (ETEC) NOT DETECTED NOT DETECTED Final   Shiga like toxin  producing E coli (STEC) NOT DETECTED NOT DETECTED Final   Shigella/Enteroinvasive E coli (EIEC) NOT DETECTED NOT DETECTED Final   Cryptosporidium NOT DETECTED NOT DETECTED Final   Cyclospora cayetanensis NOT DETECTED NOT DETECTED Final   Entamoeba histolytica NOT DETECTED NOT DETECTED Final   Giardia lamblia NOT DETECTED NOT DETECTED Final   Adenovirus F40/41 NOT DETECTED NOT DETECTED Final   Astrovirus NOT DETECTED NOT DETECTED Final   Norovirus GI/GII NOT DETECTED NOT DETECTED Final   Rotavirus A NOT DETECTED NOT DETECTED Final   Sapovirus (I, II, IV, and V) NOT DETECTED NOT DETECTED Final    Comment: Performed at [facility], [address] [place], [place] [address]  C. Diff by PCR, Reflexed     Status: Abnormal   Collection Time: 11/26/22 11:01 AM  Result Value Ref Range Status   Toxigenic C. Difficile by PCR POSITIVE (A) NEGATIVE Final    Comment: Positive for toxigenic C. difficile with little to no toxin production. Only treat if clinical presentation suggests symptomatic illness. Performed at [facility], [address], [place], [place] [address]   Culture, blood (Routine X 2) w Reflex to ID Panel     Status: None   Collection Time: 11/28/22  2:03 PM   Specimen: BLOOD LEFT ARM  Result Value Ref Range Status   Specimen Description BLOOD LEFT ARM  Final   Special Requests   Final    BOTTLES DRAWN AEROBIC ONLY Blood Culture adequate volume   Culture   Final    NO GROWTH 5 DAYS Performed at [facility], [address], [place], [place] [address]    Report Status 12/03/2022 FINAL  Final  Culture, blood (Routine X 2) w Reflex to ID Panel     Status: None   Collection Time: 11/28/22  2:07 PM   Specimen: BLOOD LEFT ARM  Result Value Ref Range Status   Specimen Description BLOOD LEFT ARM  Final   Special Requests   Final    BOTTLES DRAWN AEROBIC ONLY Blood Culture results may not be optimal due to an excessive volume of blood received in culture bottles    Culture   Final    NO GROWTH 5 DAYS Performed at [facility], [address], [place], [place] [address]    Report Status 12/03/2022 FINAL  Final     Labs: BNP (last 3 results) Recent Labs    11/30/22 0754 12/01/22 0736 12/02/22 0312  BNP 1,098.0* 1,207.2* 1,252.9*   Basic Metabolic Panel: Recent Labs  Lab 11/27/22 0525 11/27/22 0525 11/28/22 1403 11/29/22 0239 11/30/22 0754 12/01/22 0736 12/02/22 0312 12/03/22 0917  NA 138  --  136 136 131*  --   --  134*  K 3.2*  --  3.1* 3.4* 3.8  --   --  5.0  CL 110  --  108 109 104  --   --  106  CO2 18*  --  21* 19* 20*  --   --  20*  GLUCOSE 223*  --  70 80 145*  --   --  254*  BUN 24*  --  16 15 14   --   --  27*  CREATININE 0.81  --  0.92 0.94 0.91 0.83  --  1.07*  CALCIUM 8.7*  --  8.5* 8.0* 7.8*  --   --  8.5*  MG  --    < > 1.6* 1.5* 2.1 1.9 1.8 1.8   < > = values in this interval not displayed.   Liver Function Tests: Recent Labs  Lab 11/27/22 0525 11/28/22 1403 11/29/22 0239 11/30/22 0754  AST 21 25 21 18   ALT 27 22 22 17   ALKPHOS 462* 419* 376* 358*  BILITOT 0.9 0.4 0.5 0.3  PROT 7.7 6.6 5.9* 6.1*  ALBUMIN 1.6* <1.5* <1.5* <1.5*   No results for input(s): "LIPASE", "AMYLASE" in the last 168 hours. No results for input(s): "AMMONIA" in the last 168 hours. CBC: Recent Labs  Lab 11/30/22 0754 12/01/22 0736 12/01/22 2043 12/02/22 0312 12/03/22 0917  WBC 9.0 10.9* 10.7* 10.0 9.2  NEUTROABS  --   --   --   --  5.8  HGB 7.3* 6.4* 10.1* 9.3* 10.3*  HCT 22.8* 20.1* 29.8* 28.2* 32.3*  MCV 93.4 94.4 89.2 91.9 91.8  PLT 790* 828* 651* 717* 752*   Cardiac Enzymes: No results for input(s): "CKTOTAL", "CKMB", "CKMBINDEX", "TROPONINI" in the last 168 hours. BNP: Invalid input(s): "POCBNP" CBG: Recent Labs  Lab 12/02/22 1222 12/02/22 1640 12/02/22 1950 12/03/22 0831 12/03/22 1202  GLUCAP 208* 154* 98 203* 198*   D-Dimer No results for input(s): "DDIMER" in the last 72 hours. Hgb A1c No  results for input(s): "HGBA1C" in the last 72 hours. Lipid Profile No results for input(s): "CHOL", "HDL", "LDLCALC", "TRIG", "CHOLHDL", "LDLDIRECT" in the last 72 hours. Thyroid function studies No results for input(s): "TSH", "T4TOTAL", "T3FREE", "THYROIDAB" in the last 72 hours.  Invalid input(s): "FREET3" Anemia work up No results for input(s): "VITAMINB12", "FOLATE", "FERRITIN", "TIBC", "IRON", "RETICCTPCT" in the last 72 hours. Urinalysis    Component Value Date/Time   COLORURINE YELLOW 05/05/2022 1416   APPEARANCEUR CLEAR 05/05/2022 1416   LABSPEC 1.016 05/05/2022 1416   PHURINE 6.0 05/05/2022 1416   GLUCOSEU >=500 (A) 05/05/2022 1416   GLUCOSEU NEGATIVE 05/22/2020 1417   HGBUR SMALL (A) 05/05/2022 1416   BILIRUBINUR NEGATIVE 05/05/2022 1416   KETONESUR NEGATIVE 05/05/2022 1416   PROTEINUR >=300 (A) 05/05/2022 1416   UROBILINOGEN 0.2 05/22/2020 1417   NITRITE NEGATIVE 05/05/2022 1416   LEUKOCYTESUR NEGATIVE 05/05/2022 1416   Sepsis Labs Recent Labs  Lab 12/01/22 0736 12/01/22 2043 12/02/22 0312 12/03/22 0917  WBC 10.9* 10.7* 10.0 9.2   Microbiology Recent Results (from the past 240 hour(s))  Culture, blood (routine x 2)     Status: Abnormal   Collection Time: 11/26/22 10:59 AM   Specimen: BLOOD LEFT HAND  Result Value Ref Range Status   Specimen Description   Final    BLOOD LEFT HAND Performed at [facility], [address], [place], [place] [address]    Special Requests   Final    BOTTLES DRAWN AEROBIC ONLY Blood Culture adequate volume Performed at [facility], [address], [place], [place] [address]    Culture  Setup Time   Final    GRAM POSITIVE COCCI IN CLUSTERS AEROBIC BOTTLE ONLY CRITICAL RESULT CALLED TO, READ BACK BY AND VERIFIED WITH: COURTNEY EANES [phone], VIRAY,J Organism ID to follow    Culture (A)  Final    STAPHYLOCOCCUS HOMINIS THE SIGNIFICANCE OF ISOLATING THIS ORGANISM FROM A SINGLE SET OF BLOOD CULTURES WHEN MULTIPLE  SETS ARE DRAWN  IS UNCERTAIN. PLEASE NOTIFY THE MICROBIOLOGY DEPARTMENT WITHIN ONE WEEK IF SPECIATION AND SENSITIVITIES ARE REQUIRED. Performed at [facility], [address], [place], [place] [address]    Report Status 11/28/2022 FINAL  Final  Culture, blood (routine x 2)     Status: None   Collection Time: 11/26/22 10:59 AM   Specimen: BLOOD  Result Value Ref Range Status   Specimen Description BLOOD LT HAND  Final   Special Requests   Final    BOTTLES DRAWN AEROBIC AND ANAEROBIC Blood Culture adequate volume   Culture   Final    NO GROWTH 5 DAYS Performed at [facility], [address], [place], [place] [address]    Report Status 12/01/2022 FINAL  Final  Blood Culture ID Panel (Reflexed)     Status: Abnormal   Collection Time: 11/26/22 10:59 AM  Result Value Ref Range Status   Enterococcus faecalis NOT DETECTED NOT DETECTED Final   Enterococcus Faecium NOT DETECTED NOT DETECTED Final   Listeria monocytogenes NOT DETECTED NOT DETECTED Final   Staphylococcus species DETECTED (A) NOT DETECTED Final    Comment: CRITICAL RESULT CALLED TO, READ BACK BY AND VERIFIED WITH: L. POOLE PHARMD, AT 1057 11/27/22 D. VANHOOK    Staphylococcus aureus (BCID) NOT DETECTED NOT DETECTED Final   Staphylococcus epidermidis NOT DETECTED NOT DETECTED Final   Staphylococcus lugdunensis NOT DETECTED NOT DETECTED Final   Streptococcus species NOT DETECTED NOT DETECTED Final   Streptococcus agalactiae NOT DETECTED NOT DETECTED Final   Streptococcus pneumoniae NOT DETECTED NOT DETECTED Final   Streptococcus pyogenes NOT DETECTED NOT DETECTED Final   A.calcoaceticus-baumannii NOT DETECTED NOT DETECTED Final   Bacteroides fragilis NOT DETECTED NOT DETECTED Final   Enterobacterales NOT DETECTED NOT DETECTED Final   Enterobacter cloacae complex NOT DETECTED NOT DETECTED Final   Escherichia coli NOT DETECTED NOT DETECTED Final   Klebsiella aerogenes NOT DETECTED NOT DETECTED Final   Klebsiella oxytoca  NOT DETECTED NOT DETECTED Final   Klebsiella pneumoniae NOT DETECTED NOT DETECTED Final   Proteus species NOT DETECTED NOT DETECTED Final   Salmonella species NOT DETECTED NOT DETECTED Final   Serratia marcescens NOT DETECTED NOT DETECTED Final   Haemophilus influenzae NOT DETECTED NOT DETECTED Final   Neisseria meningitidis NOT DETECTED NOT DETECTED Final   Pseudomonas aeruginosa NOT DETECTED NOT DETECTED Final   Stenotrophomonas maltophilia NOT DETECTED NOT DETECTED Final   Candida albicans NOT DETECTED NOT DETECTED Final   Candida auris NOT DETECTED NOT DETECTED Final   Candida glabrata NOT DETECTED NOT DETECTED Final   Candida krusei NOT DETECTED NOT DETECTED Final   Candida parapsilosis NOT DETECTED NOT DETECTED Final   Candida tropicalis NOT DETECTED NOT DETECTED Final   Cryptococcus neoformans/gattii NOT DETECTED NOT DETECTED Final    Comment: Performed at [facility], [address], [place], [place] [address]  C Difficile Quick Screen w PCR reflex     Status: Abnormal   Collection Time: 11/26/22 11:01 AM   Specimen: STOOL  Result Value Ref Range Status   C Diff antigen POSITIVE (A) NEGATIVE Final   C Diff toxin NEGATIVE NEGATIVE Final   C Diff interpretation Results are indeterminate. See PCR results.  Final    Comment: Performed at [facility], [address] [place], [place] [address]  Gastrointestinal Panel by PCR , Stool     Status: None   Collection Time: 11/26/22 11:01 AM   Specimen: STOOL  Result Value Ref Range Status   Campylobacter  species NOT DETECTED NOT DETECTED Final   Plesimonas shigelloides NOT DETECTED NOT DETECTED Final   Salmonella species NOT DETECTED NOT DETECTED Final   Yersinia enterocolitica NOT DETECTED NOT DETECTED Final   Vibrio species NOT DETECTED NOT DETECTED Final   Vibrio cholerae NOT DETECTED NOT DETECTED Final   Enteroaggregative E coli (EAEC) NOT DETECTED NOT DETECTED Final   Enteropathogenic E coli (EPEC) NOT DETECTED NOT  DETECTED Final   Enterotoxigenic E coli (ETEC) NOT DETECTED NOT DETECTED Final   Shiga like toxin producing E coli (STEC) NOT DETECTED NOT DETECTED Final   Shigella/Enteroinvasive E coli (EIEC) NOT DETECTED NOT DETECTED Final   Cryptosporidium NOT DETECTED NOT DETECTED Final   Cyclospora cayetanensis NOT DETECTED NOT DETECTED Final   Entamoeba histolytica NOT DETECTED NOT DETECTED Final   Giardia lamblia NOT DETECTED NOT DETECTED Final   Adenovirus F40/41 NOT DETECTED NOT DETECTED Final   Astrovirus NOT DETECTED NOT DETECTED Final   Norovirus GI/GII NOT DETECTED NOT DETECTED Final   Rotavirus A NOT DETECTED NOT DETECTED Final   Sapovirus (I, II, IV, and V) NOT DETECTED NOT DETECTED Final    Comment: Performed at [facility], [address] [place], [place] [address]  C. Diff by PCR, Reflexed     Status: Abnormal   Collection Time: 11/26/22 11:01 AM  Result Value Ref Range Status   Toxigenic C. Difficile by PCR POSITIVE (A) NEGATIVE Final    Comment: Positive for toxigenic C. difficile with little to no toxin production. Only treat if clinical presentation suggests symptomatic illness. Performed at [facility], [address] [place], [place] [address]   Culture, blood (Routine X 2) w Reflex to ID Panel     Status: None   Collection Time: 11/28/22  2:03 PM   Specimen: BLOOD LEFT ARM  Result Value Ref Range Status   Specimen Description BLOOD LEFT ARM  Final   Special Requests   Final    BOTTLES DRAWN AEROBIC ONLY Blood Culture adequate volume   Culture   Final    NO GROWTH 5 DAYS Performed at [facility], [address] [place], [place] [address]    Report Status 12/03/2022 FINAL  Final  Culture, blood (Routine X 2) w Reflex to ID Panel     Status: None   Collection Time: 11/28/22  2:07 PM   Specimen: BLOOD LEFT ARM  Result Value Ref Range Status   Specimen Description BLOOD LEFT ARM  Final   Special Requests   Final    BOTTLES DRAWN AEROBIC ONLY  Blood Culture results may not be optimal due to an excessive volume of blood received in culture bottles   Culture   Final    NO GROWTH 5 DAYS Performed at [facility], [address] [place], [place] [address]    Report Status 12/03/2022 FINAL  Final     Time coordinating discharge: Over 30 minutes  SIGNED:   Huey Bienenstock, MD  [facility] 12/03/2022, 3:24 PM Pager   If 7PM-7AM, please contact night-coverage [email or web address]

## 2022-12-03 NOTE — TOC Transition Note (Signed)
Transition of Care [facility] - CM/SW Discharge Note   Patient Details  Name: Pasha Gadison MRN: [id number] Date of Birth: 1990/03/11  Transition of Care [facility] CM/SW Contact:  Mearl Latin, LCSW Phone Number: 12/03/2022, 2:26 PM   Clinical Narrative:    Patient will DC to: [facility] Anticipated DC date: 12/03/22 Family notified: Mother Transport by: Sharin Mons   Per MD patient ready for DC to [facility]. RN to call report prior to discharge [phone]). RN, patient, patient's family, and facility notified of DC. Discharge Summary and FL2 sent to facility. DC packet on chart including signed scripts. Ambulance transport requested for patient.   CSW will sign off for now as social work intervention is no longer needed. Please consult [place] again if new needs arise.     Final next level of care: Skilled Nursing Facility Barriers to Discharge: Barriers Resolved   Patient Goals and CMS Choice CMS Medicare.gov Compare Post Acute Care list provided to:: Patient Choice offered to / list presented to : Patient  Discharge Placement     Existing PASRR number confirmed : 12/03/22          Patient chooses bed at:  [facility]) Patient to be transferred to facility by: [facility] Name of family member notified: Robbie Patient and family notified of of transfer: 12/03/22  Discharge Plan and Services Additional resources added to the After Visit Summary for       Post Acute Care Choice: Skilled Nursing Facility                               Social Determinants of Health (SDOH) Interventions SDOH Screenings   Food Insecurity: No Food Insecurity (10/03/2022)  Housing: Low Risk  (10/03/2022)  Transportation Needs: No Transportation Needs (10/03/2022)  Utilities: Not At Risk (10/03/2022)  Alcohol Screen: Low Risk  (05/29/2021)  Depression (PHQ2-9): Low Risk  (05/29/2021)  Recent Concern: Depression (PHQ2-9) - Medium Risk (04/10/2021)  Financial Resource Strain: Low Risk   (02/13/2022)   Received from [facility]  Social Connections: Unknown (06/17/2021)   Received from [facility]  Stress: No Stress Concern Present (09/16/2022)   Received from [facility]  Tobacco Use: Low Risk  (11/27/2022)     Readmission Risk Interventions    11/27/2022    2:33 PM 05/08/2022    9:45 AM 03/24/2022    3:33 PM  Readmission Risk Prevention Plan  Transportation Screening Complete Complete Complete  PCP or Specialist Appt within 3-5 Days Not Complete    HRI or Home Care Consult Complete    Social Work Consult for Recovery Care Planning/Counseling Complete    Palliative Care Screening Not Applicable    Medication Review Oceanographer) Complete Complete Complete  PCP or Specialist appointment within 3-5 days of discharge  Complete Complete  HRI or Home Care Consult  Complete Complete  SW Recovery Care/Counseling Consult  Complete Complete  Palliative Care Screening  Not Applicable Not Applicable  Skilled Nursing Facility  Not Applicable Not Applicable

## 2022-12-03 NOTE — Progress Notes (Signed)
Inpatient Rehab Admissions Coordinator:   Per therapy recommendations, patient was screened for CIR candidacy by Megan Salon, MS, CCC-SLP. Per TOC, her mother wants Pt. To have a longer rehab. Plan is now for SNF. I will not pursue CIR admit.  Please contact me any with questions.  Megan Salon, MS, CCC-SLP Rehab Admissions Coordinator  [phone] (celll) [phone] (office)

## 2022-12-03 NOTE — Plan of Care (Signed)
  Problem: Education: Goal: Knowledge of the prescribed therapeutic regimen will improve Outcome: Progressing   Problem: Activity: Goal: Ability to perform//tolerate increased activity and mobilize with assistive devices will improve Outcome: Progressing   Problem: Clinical Measurements: Goal: Postoperative complications will be avoided or minimized Outcome: Progressing   Problem: Self-Care: Goal: Ability to meet self-care needs will improve Outcome: Progressing   Problem: Self-Concept: Goal: Ability to maintain and perform role responsibilities to the fullest extent possible will improve Outcome: Progressing   Problem: Pain Management: Goal: Pain level will decrease with appropriate interventions Outcome: Progressing   Problem: Skin Integrity: Goal: Demonstration of wound healing without infection will improve Outcome: Progressing   Problem: Activity: Goal: Ability to tolerate increased activity will improve Outcome: Progressing

## 2022-12-05 LAB — MIC RESULT

## 2022-12-05 LAB — MINIMUM INHIBITORY CONC. (1 DRUG)

## 2022-12-10 ENCOUNTER — Telehealth: Payer: Self-pay

## 2022-12-10 LAB — AEROBIC/ANAEROBIC CULTURE W GRAM STAIN (SURGICAL/DEEP WOUND)

## 2022-12-10 NOTE — Telephone Encounter (Signed)
-----   Message from Nadara Mustard sent at 12/10/2022  2:35 PM EDT ----- Patient's cultures just came back and both cultures are sensitive to Bactrim DS.  She is currently in skilled nursing in [place].  Can you call and make sure she is on antibiotic coverage.  At a minimum she should be on oral Septra DS twice daily.  For 2 weeks. ----- Message ----- From: Interface, [facility] Sent: 10/28/2022   9:59 AM EDT To: Nadara Mustard, MD

## 2022-12-10 NOTE — Telephone Encounter (Signed)
Called nd sw tonita at [facility] [phone] v.o given for septra DS bid x 2 weeks as advised below. Confirmed pt has a follow up appt on Friday  with Erin.

## 2022-12-12 ENCOUNTER — Encounter: Payer: Medicaid Other | Admitting: Family

## 2022-12-15 NOTE — Op Note (Unsigned)
Kelli Hudson, Kelli Hudson MEDICAL RECORD NO: [id number] ACCOUNT NO: [id number] DATE OF BIRTH: 28-Nov-1990 FACILITY: [facility] LOCATION: [facility] PHYSICIAN: Doralee Albino. Carola Frost, MD  Operative Report   DATE OF PROCEDURE: 10/12/2022  PREOPERATIVE DIAGNOSIS:  Bilateral lower extremity necrotizing fasciitis.  POSTOPERATIVE DIAGNOSIS:  Bilateral lower extremity necrotizing fasciitis.  PROCEDURES:   1.  Bilateral thigh and leg excisional debridement. 2.  Bilateral foot debridement including skin, subcutaneous tissue and deep fascia. 3.  Wound VAC dressing change under anesthesia, bilateral thighs. 4.  Complex retention suture closure of left leg, 35 cm.  SURGEON:  Doralee Albino. Carola Frost, MD  ASSISTANT:  None.  ANESTHESIA:  General.  COMPLICATIONS:  None.  TOURNIQUET:  None.  PATIENT DISPOSITION:  To ICU.  CONDITION:  Critical, but hemodynamically stable.  BRIEF SUMMARY OF INDICATIONS FOR PROCEDURE:  The patient is a 32 year old female who has been under the care of Dr. Aldean Baker for bilateral necrotizing fasciitis.  Because of Dr. Audrie Lia prior commitment and lack of availability today, we coordinated  her care.  The plan was to perform repeat debridement to make sure that her necrotizing fasciitis had not progressed significantly in a proximal direction, particularly on the right thigh and to perhaps advance toward closure on the left.  I discussed  with her mother the risks and benefits of surgery.  She did wish to proceed.  BRIEF SUMMARY OF PROCEDURE:  The patient was taken to the operating room where general anesthesia was induced or transferred to the anesthesia machine.  Evaluation of the lower extremities showed profound areas of necrosis of the plantar aspect of both  feet with large linear incisions from the metatarsals proximally through the mid foot.  There were also bilateral midline incisions on the anterior and exterior of the thigh, straight, medial and lateral on both sides.  Both  lower extremities were  prepped and draped in usual sterile fashion after removal of the deep wound VAC sponges.  After draping, timeout was held, began with the left side, irrigated thoroughly.  I did not identify any purulence, I did not identify any particularly concerning  dirty dishwater exudate on that side and was able to perform a retention suture closure of approximately 35 cm.  Some of this remained open and did require another wound VAC dressing change.  Attention was then turned to the right side.  The medial  incision was extended up the thigh.  I did not identify any purulence, but there was some edema and consequently no closure was performed here.  Instead, I irrigated thoroughly as I did on the lateral side and reapplied a wound VAC using the Veraflo  system.  The plantar aspects of the foot were debrided sharply with a scalpel, removing skin and subcutaneous tissues and necrotic deep fascia.  The patient was returned to the ICU in critical, but hemodynamically stable condition.  PROGNOSIS:  The patient remains on the care of Dr. Lajoyce Corners  and certainly will need to return to the OR for a serial subsequent debridement.  She remains on antibiotic coverage and risk of amputation remains elevated, which has been discussed with the  family.        PAA D: 12/14/2022 7:38:23 pm T: 12/15/2022 1:33:00 am  JOB: [id number]/ [id number]

## 2022-12-16 ENCOUNTER — Other Ambulatory Visit (HOSPITAL_COMMUNITY): Payer: Self-pay

## 2022-12-16 ENCOUNTER — Telehealth: Payer: Self-pay | Admitting: Pharmacy Technician

## 2022-12-16 ENCOUNTER — Telehealth: Payer: Self-pay

## 2022-12-16 NOTE — Telephone Encounter (Signed)
Patient has been experiencing night sweats that have gotten worse and wants to know what's going on. Patient is currently in rehab and has been schedule for December and would like to know what you would advise.

## 2022-12-16 NOTE — Telephone Encounter (Signed)
Pharmacy Patient Advocate Encounter   Received notification from Fax/CoverMyMeds renewal that prior authorization for Dexcom G6 Sensors is about to expire.   Insurance verification completed.   The patient is insured through [facility] .   Per test claim: PA required; PA started via CoverMyMeds. KEY [id number] . Please see clinical question(s) below that I am not finding the answer to in her chart and advise.  Has the patient been using the continuous glucose monitoring system as prescribed? Note: Documentation must be submitted.*  (Are you guys able to see her monitoring remotely to show if she's been using it or not? I see that she's had quite a few medical issues of recent and hasn't had another apt with this office. I also see that [employer] doesn't show her filling them since the end of July. (It is possible that she got them from a different pharmacy that doesn't report to EPIC, but not likely)

## 2022-12-16 NOTE — Telephone Encounter (Signed)
Patient currently in rehab and has not been using the Dexcom. She will be release around Thanksgiving and will follow up at that time

## 2022-12-17 ENCOUNTER — Encounter: Payer: Self-pay | Admitting: Family

## 2022-12-17 ENCOUNTER — Ambulatory Visit (INDEPENDENT_AMBULATORY_CARE_PROVIDER_SITE_OTHER): Payer: Medicaid Other | Admitting: Family

## 2022-12-17 DIAGNOSIS — Z89612 Acquired absence of left leg above knee: Secondary | ICD-10-CM | POA: Diagnosis not present

## 2022-12-17 DIAGNOSIS — Z89611 Acquired absence of right leg above knee: Secondary | ICD-10-CM | POA: Diagnosis not present

## 2022-12-17 NOTE — Progress Notes (Signed)
Post-Op Visit Note   Patient: Kelli Hudson           Date of Birth: 1990-07-22           MRN: [id number] Visit Date: 12/17/2022 PCP: Norm Salt, PA  Chief Complaint:  Chief Complaint  Patient presents with   Right Leg - Routine Post Op    11/14/22 bilateral AKA   Left Leg - Routine Post Op    HPI:  HPI The patient is a 32 year old woman who is seen status post bilateral above-knee amputations September 27.  She is residing at skilled nursing.  This is her first postop. Ortho Exam On examination bilateral residual limb staples and sutures are in place incisions are well-healed there is no gaping drainage or erythema  Visit Diagnoses: No diagnosis found.  Plan: May proceed with prosthesis set up.  Daily Dial soap cleansing of incisions dry dressings.  Shrinker's around-the-clock once obtained  Follow-Up Instructions: Return in about 4 weeks (around 01/14/2023).   Imaging: No results found.  Orders:  No orders of the defined types were placed in this encounter.  No orders of the defined types were placed in this encounter.    PMFS History: Patient Active Problem List   Diagnosis Date Noted   Abscess of thigh 11/28/2022   C. 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   Hypokalemia 11/27/2022   Effusion, left knee 11/05/2022   MDD (major depressive disorder), recurrent episode, moderate (HCC) 10/16/2022   Necrotizing fasciitis of pelvic region and thigh (HCC) 10/08/2022   Necrotizing fasciitis of lower leg (HCC) 10/08/2022   Necrotizing fasciitis (HCC) 10/08/2022   Sepsis due to cellulitis (HCC) 10/03/2022   Cellulitis of lower extremity 10/03/2022   Multinodular goiter 06/18/2022   Malnutrition of moderate degree 04/18/2022   Intractable vomiting with nausea 04/17/2022   DKA (diabetic ketoacidosis) (HCC) 03/04/2022   Refractory nausea and vomiting 12/25/2021   Diabetic gastroparesis (HCC) 12/07/2021    Intractable nausea and vomiting 10/09/2021   Drug-seeking behavior 09/21/2021   Essential hypertension 08/25/2021   GERD without esophagitis 08/25/2021   Hematemesis    Prolonged Q-T interval on ECG    Nausea and vomiting 07/20/2021   Hypomagnesemia 04/21/2021   Ulcerated, foot, left, with fat layer exposed (HCC) 04/20/2021   Uncontrolled type 1 diabetes mellitus with hyperglycemia, with long-term current use of insulin (HCC) 12/18/2020   DM type 1 (diabetes mellitus, type 1) (HCC) 12/18/2020   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) 11/09/2020   Diabetic retinopathy of both eyes associated with type 1 diabetes mellitus (HCC) 09/24/2020   Diabetic gastroparesis associated with type 1 diabetes mellitus (HCC) 08/12/2020   AKI (acute kidney injury) (HCC) 07/25/2020   Generalized abdominal pain 07/23/2020   Diabetic nephropathy associated with type 1 diabetes mellitus (HCC) 06/27/2020   Sepsis (HCC) 05/27/2020   HLD (hyperlipidemia) 05/23/2020   Sinus tachycardia 05/09/2020   Anemia 04/20/2020   Depression with anxiety 07/05/2019   Diabetic polyneuropathy associated with type 1 diabetes mellitus (HCC) 09/24/2017   Gastroparesis    Goiter 10/05/2009   Hypertension associated with diabetes (HCC) 10/05/2009   Past Medical History:  Diagnosis Date   Acute H. pylori gastric ulcer    Coffee ground emesis    Diabetes mellitus (HCC)    Diabetic gastroparesis (HCC)    DKA (diabetic ketoacidosis) (HCC) 02/24/2021   Gastroparesis    GERD (gastroesophageal reflux disease)  Hypertension    Hyperthyroidism    Intractable nausea and vomiting 04/20/2021   Normocytic anemia 04/20/2020   Prolonged Q-T interval on ECG     Family History  Problem Relation Age of Onset   Lung cancer Mother    Diabetes Mother    Bipolar disorder Father    Liver cancer Maternal Grandfather    Diabetes Maternal Aunt        x 2   Pancreatic cancer Maternal Uncle    Prostate  cancer Maternal Uncle    Breast cancer Other        maternal great aunt   Heart disease Other    Ovarian cancer Other        maternal great aunt   Kidney disease Other        maternal great aunt   Colon cancer Neg Hx    Stomach cancer Neg Hx     Past Surgical History:  Procedure Laterality Date   AMPUTATION Bilateral 10/24/2022   Procedure: BILATERAL BELOW KNEE AMPUTATION;  Surgeon: Nadara Mustard, MD;  Location: [facility] OR;  Service: Orthopedics;  Laterality: Bilateral;   AMPUTATION Bilateral 10/08/2022   Procedure: BILATERAL LEG DEBRIDEMENT;  Surgeon: Nadara Mustard, MD;  Location: [facility] OR;  Service: Orthopedics;  Laterality: Bilateral;   AMPUTATION Bilateral 11/14/2022   Procedure: BILATERAL ABOVE KNEE AMPUTATION;  Surgeon: Nadara Mustard, MD;  Location: [facility] OR;  Service: Orthopedics;  Laterality: Bilateral;   AMPUTATION TOE Left 03/10/2018   Procedure: AMPUTATION FIFTH TOE;  Surgeon: Vivi Barrack, DPM;  Location: [facility];  Service: Podiatry;  Laterality: Left;   APPLICATION OF WOUND VAC Bilateral 10/12/2022   Procedure: APPLICATION OF WOUND VAC BILATERAL LEGS;  Surgeon: Myrene Galas, MD;  Location: [facility] OR;  Service: Orthopedics;  Laterality: Bilateral;   APPLICATION OF WOUND VAC Bilateral 10/24/2022   Procedure: APPLICATION OF WOUND VAC;  Surgeon: Nadara Mustard, MD;  Location: [facility] OR;  Service: Orthopedics;  Laterality: Bilateral;   BIOPSY  01/28/2020   Procedure: BIOPSY;  Surgeon: Kathi Der, MD;  Location: [facility];  Service: Gastroenterology;;   BOTOX INJECTION  08/26/2021   Procedure: BOTOX INJECTION;  Surgeon: Sherrilyn Rist, MD;  Location: [facility];  Service: Gastroenterology;;   ESOPHAGOGASTRODUODENOSCOPY N/A 01/28/2020   Procedure: ESOPHAGOGASTRODUODENOSCOPY (EGD);  Surgeon: Kathi Der, MD;  Location: [facility];  Service: Gastroenterology;  Laterality: N/A;   ESOPHAGOGASTRODUODENOSCOPY (EGD) WITH PROPOFOL Left 09/08/2015   Procedure:  ESOPHAGOGASTRODUODENOSCOPY (EGD) WITH PROPOFOL;  Surgeon: Willis Modena, MD;  Location: [facility] END[facility]COPY;  Service: Endoscopy;  Laterality: Left;   ESOPHAGOGASTRODUODENOSCOPY (EGD) WITH PROPOFOL N/A 08/26/2021   Procedure: ESOPHAGOGASTRODUODENOSCOPY (EGD) WITH PROPOFOL;  Surgeon: Sherrilyn Rist, MD;  Location: [facility];  Service: Gastroenterology;  Laterality: N/A;   GASTRIC STIMULATOR IMPLANT SURGERY  06/2022   at [facility]   I & D EXTREMITY Bilateral 10/12/2022   Procedure: IRRIGATION AND  DEBRIDEMENT  BILATERAL LEGS;  Surgeon: Myrene Galas, MD;  Location: [facility] OR;  Service: Orthopedics;  Laterality: Bilateral;   I & D EXTREMITY Right 10/13/2022   Procedure: RIGHT THIGH WOUND VAC EXCHANGE;  Surgeon: Roby Lofts, MD;  Location: [facility] OR;  Service: Orthopedics;  Laterality: Right;   I & D EXTREMITY Bilateral 10/17/2022   Procedure: BILATERAL THIGH AND LEG DEBRIDEMENT, PARTIAL BILATERAL FOOT AMPUTATION;  Surgeon: Nadara Mustard, MD;  Location: [facility];  Service: Orthopedics;  Laterality: Bilateral;   I & D EXTREMITY Bilateral 11/05/2022   Procedure: BILATERAL  THIGH DEBRIDEMENT;  Surgeon: Nadara Mustard, MD;  Location: [facility] OR;  Service: Orthopedics;  Laterality: Bilateral;   I & D EXTREMITY Bilateral 11/07/2022   Procedure: BILATERAL THIGH AND LEG DEBRIDEMENT;  Surgeon: Nadara Mustard, MD;  Location: [facility] OR;  Service: Orthopedics;  Laterality: Bilateral;   I & D EXTREMITY Bilateral 11/12/2022   Procedure: BILATERAL LEG DEBRIDEMENTS;  Surgeon: Nadara Mustard, MD;  Location: [facility] OR;  Service: Orthopedics;  Laterality: Bilateral;   IRRIGATION AND DEBRIDEMENT FOOT Bilateral 10/05/2022   Procedure: IRRIGATION AND DEBRIDEMENT FOOT;  Surgeon: [name], DPM;  Location: [facility];  Service: Orthopedics/Podiatry;  Laterality: Bilateral;   WISDOM TOOTH EXTRACTION     Social History   Occupational History   Occupation: Financial risk analyst  Tobacco Use   Smoking status: Never   Smokeless tobacco: Never  Vaping Use    Vaping status: Never Used  Substance and Sexual Activity   Alcohol use: Yes    Alcohol/week: 1.0 standard drink of alcohol    Types: 1 Shots of liquor per week    Comment: occasional    Drug use: No   Sexual activity: Yes    Birth control/protection: Condom, Implant    Comment: Nexplanon

## 2022-12-17 NOTE — Telephone Encounter (Signed)
Patient advised.

## 2023-01-14 ENCOUNTER — Ambulatory Visit: Payer: Medicaid Other | Admitting: Family

## 2023-01-14 ENCOUNTER — Encounter: Payer: Self-pay | Admitting: Family

## 2023-01-14 DIAGNOSIS — Z89612 Acquired absence of left leg above knee: Secondary | ICD-10-CM

## 2023-01-14 DIAGNOSIS — Z89611 Acquired absence of right leg above knee: Secondary | ICD-10-CM

## 2023-01-14 NOTE — Progress Notes (Addendum)
Post-Op Visit Note   Patient: Kelli Hudson           Date of Birth: 16-May-1990           MRN: [id number] Visit Date: 01/14/2023 PCP: Norm Salt, PA  Chief Complaint:  Chief Complaint  Patient presents with   Right Leg - Routine Post Op    11/14/22 bilateral AKA   Left Leg - Routine Post Op    HPI:  HPI The patient is a 32 year old woman seen status post bilateral above-knee amputations.  She reports residing at [facility] in [place].  She reports she is discharging home today. Ortho Exam On examination bilateral above-knee amputations these are well-healed.  She continues with moderate edema.  Her current shrinker is her too large and fall off.  Patient is a new bilateral transfemoral amputee.  Patient's current comorbidities are not expected to impact the ability to function with the prescribed prosthesis. Patient verbally communicates a strong desire to use a prosthesis. Patient currently requires mobility aids to ambulate without a prosthesis.  Expects not to use mobility aids with a new prosthesis.  Patient is a K2 level ambulator that will use a prosthesis to walk around their home and the community over low level environmental barriers.      Visit Diagnoses: No diagnosis found.  Plan: Proceed with prosthesis set up and gait training.  She will follow-up in the office as needed.  Follow-Up Instructions: No follow-ups on file.   Imaging: No results found.  Orders:  No orders of the defined types were placed in this encounter.  No orders of the defined types were placed in this encounter.    PMFS History: Patient Active Problem List   Diagnosis Date Noted   Abscess of thigh 11/28/2022   C. 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   Hypokalemia 11/27/2022   Effusion, left knee 11/05/2022   MDD (major depressive disorder), recurrent episode, moderate (HCC) 10/16/2022   Necrotizing  fasciitis of pelvic region and thigh (HCC) 10/08/2022   Necrotizing fasciitis of lower leg (HCC) 10/08/2022   Necrotizing fasciitis (HCC) 10/08/2022   Sepsis due to cellulitis (HCC) 10/03/2022   Cellulitis of lower extremity 10/03/2022   Multinodular goiter 06/18/2022   Malnutrition of moderate degree 04/18/2022   Intractable vomiting with nausea 04/17/2022   DKA (diabetic ketoacidosis) (HCC) 03/04/2022   Refractory nausea and vomiting 12/25/2021   Diabetic gastroparesis (HCC) 12/07/2021   Intractable nausea and vomiting 10/09/2021   Drug-seeking behavior 09/21/2021   Essential hypertension 08/25/2021   GERD without esophagitis 08/25/2021   Hematemesis    Prolonged Q-T interval on ECG    Nausea and vomiting 07/20/2021   Hypomagnesemia 04/21/2021   Ulcerated, foot, left, with fat layer exposed (HCC) 04/20/2021   Uncontrolled type 1 diabetes mellitus with hyperglycemia, with long-term current use of insulin (HCC) 12/18/2020   DM type 1 (diabetes mellitus, type 1) (HCC) 12/18/2020   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) 11/09/2020   Diabetic retinopathy of both eyes associated with type 1 diabetes mellitus (HCC) 09/24/2020   Diabetic gastroparesis associated with type 1 diabetes mellitus (HCC) 08/12/2020   AKI (acute kidney injury) (HCC) 07/25/2020   Generalized abdominal pain 07/23/2020   Diabetic nephropathy associated with type 1 diabetes mellitus (HCC) 06/27/2020   Sepsis (HCC) 05/27/2020   HLD (hyperlipidemia) 05/23/2020   Sinus tachycardia 05/09/2020   Anemia 04/20/2020   Depression with anxiety  07/05/2019   Diabetic polyneuropathy associated with type 1 diabetes mellitus (HCC) 09/24/2017   Gastroparesis    Goiter 10/05/2009   Hypertension associated with diabetes (HCC) 10/05/2009   Past Medical History:  Diagnosis Date   Acute H. pylori gastric ulcer    Coffee ground emesis    Diabetes mellitus (HCC)    Diabetic gastroparesis (HCC)    DKA  (diabetic ketoacidosis) (HCC) 02/24/2021   Gastroparesis    GERD (gastroesophageal reflux disease)    Hypertension    Hyperthyroidism    Intractable nausea and vomiting 04/20/2021   Normocytic anemia 04/20/2020   Prolonged Q-T interval on ECG     Family History  Problem Relation Age of Onset   Lung cancer Mother    Diabetes Mother    Bipolar disorder Father    Liver cancer Maternal Grandfather    Diabetes Maternal Aunt        x 2   Pancreatic cancer Maternal Uncle    Prostate cancer Maternal Uncle    Breast cancer Other        maternal great aunt   Heart disease Other    Ovarian cancer Other        maternal great aunt   Kidney disease Other        maternal great aunt   Colon cancer Neg Hx    Stomach cancer Neg Hx     Past Surgical History:  Procedure Laterality Date   AMPUTATION Bilateral 10/24/2022   Procedure: BILATERAL BELOW KNEE AMPUTATION;  Surgeon: Nadara Mustard, MD;  Location: [facility];  Service: Orthopedics;  Laterality: Bilateral;   AMPUTATION Bilateral 10/08/2022   Procedure: BILATERAL LEG DEBRIDEMENT;  Surgeon: Nadara Mustard, MD;  Location: [facility] OR;  Service: Orthopedics;  Laterality: Bilateral;   AMPUTATION Bilateral 11/14/2022   Procedure: BILATERAL ABOVE KNEE AMPUTATION;  Surgeon: Nadara Mustard, MD;  Location: [facility] OR;  Service: Orthopedics;  Laterality: Bilateral;   AMPUTATION TOE Left 03/10/2018   Procedure: AMPUTATION FIFTH TOE;  Surgeon: Vivi Barrack, DPM;  Location: [facility];  Service: Podiatry;  Laterality: Left;   APPLICATION OF WOUND VAC Bilateral 10/12/2022   Procedure: APPLICATION OF WOUND VAC BILATERAL LEGS;  Surgeon: Myrene Galas, MD;  Location: [facility];  Service: Orthopedics;  Laterality: Bilateral;   APPLICATION OF WOUND VAC Bilateral 10/24/2022   Procedure: APPLICATION OF WOUND VAC;  Surgeon: Nadara Mustard, MD;  Location: [facility];  Service: Orthopedics;  Laterality: Bilateral;   BIOPSY  01/28/2020   Procedure: BIOPSY;  Surgeon:  Kathi Der, MD;  Location: [facility];  Service: Gastroenterology;;   BOTOX INJECTION  08/26/2021   Procedure: BOTOX INJECTION;  Surgeon: Sherrilyn Rist, MD;  Location: [facility];  Service: Gastroenterology;;   ESOPHAGOGASTRODUODENOSCOPY N/A 01/28/2020   Procedure: ESOPHAGOGASTRODUODENOSCOPY (EGD);  Surgeon: Kathi Der, MD;  Location: [facility];  Service: Gastroenterology;  Laterality: N/A;   ESOPHAGOGASTRODUODENOSCOPY (EGD) WITH PROPOFOL Left 09/08/2015   Procedure: ESOPHAGOGASTRODUODENOSCOPY (EGD) WITH PROPOFOL;  Surgeon: Willis Modena, MD;  Location: [facility] END[facility]COPY;  Service: Endoscopy;  Laterality: Left;   ESOPHAGOGASTRODUODENOSCOPY (EGD) WITH PROPOFOL N/A 08/26/2021   Procedure: ESOPHAGOGASTRODUODENOSCOPY (EGD) WITH PROPOFOL;  Surgeon: Sherrilyn Rist, MD;  Location: [facility];  Service: Gastroenterology;  Laterality: N/A;   GASTRIC STIMULATOR IMPLANT SURGERY  06/2022   at [facility]   I & D EXTREMITY Bilateral 10/12/2022   Procedure: IRRIGATION AND  DEBRIDEMENT  BILATERAL LEGS;  Surgeon: Myrene Galas, MD;  Location: [facility] OR;  Service: Orthopedics;  Laterality: Bilateral;   I & D EXTREMITY Right 10/13/2022   Procedure: RIGHT THIGH WOUND VAC EXCHANGE;  Surgeon: Roby Lofts, MD;  Location: [facility];  Service: Orthopedics;  Laterality: Right;   I & D EXTREMITY Bilateral 10/17/2022   Procedure: BILATERAL THIGH AND LEG DEBRIDEMENT, PARTIAL BILATERAL FOOT AMPUTATION;  Surgeon: Nadara Mustard, MD;  Location: [facility];  Service: Orthopedics;  Laterality: Bilateral;   I & D EXTREMITY Bilateral 11/05/2022   Procedure: BILATERAL THIGH DEBRIDEMENT;  Surgeon: Nadara Mustard, MD;  Location: [facility] OR;  Service: Orthopedics;  Laterality: Bilateral;   I & D EXTREMITY Bilateral 11/07/2022   Procedure: BILATERAL THIGH AND LEG DEBRIDEMENT;  Surgeon: Nadara Mustard, MD;  Location: [facility] OR;  Service: Orthopedics;  Laterality: Bilateral;   I & D EXTREMITY Bilateral 11/12/2022   Procedure: BILATERAL  LEG DEBRIDEMENTS;  Surgeon: Nadara Mustard, MD;  Location: [facility] OR;  Service: Orthopedics;  Laterality: Bilateral;   IRRIGATION AND DEBRIDEMENT FOOT Bilateral 10/05/2022   Procedure: IRRIGATION AND DEBRIDEMENT FOOT;  Surgeon: [name], DPM;  Location: [facility];  Service: Orthopedics/Podiatry;  Laterality: Bilateral;   WISDOM TOOTH EXTRACTION     Social History   Occupational History   Occupation: Financial risk analyst  Tobacco Use   Smoking status: Never   Smokeless tobacco: Never  Vaping Use   Vaping status: Never Used  Substance and Sexual Activity   Alcohol use: Yes    Alcohol/week: 1.0 standard drink of alcohol    Types: 1 Shots of liquor per week    Comment: occasional    Drug use: No   Sexual activity: Yes    Birth control/protection: Condom, Implant    Comment: Nexplanon

## 2023-01-20 ENCOUNTER — Inpatient Hospital Stay (HOSPITAL_COMMUNITY)
Admission: EM | Admit: 2023-01-20 | Discharge: 2023-01-23 | DRG: 073 | Disposition: A | Discharge status: Home / Self Care | Payer: Medicare Other | Attending: Internal Medicine | Admitting: Internal Medicine

## 2023-01-20 DIAGNOSIS — K219 Gastro-esophageal reflux disease without esophagitis: Secondary | ICD-10-CM | POA: Diagnosis present

## 2023-01-20 DIAGNOSIS — E1042 Type 1 diabetes mellitus with diabetic polyneuropathy: Secondary | ICD-10-CM

## 2023-01-20 DIAGNOSIS — Z886 Allergy status to analgesic agent status: Secondary | ICD-10-CM

## 2023-01-20 DIAGNOSIS — R109 Unspecified abdominal pain: Secondary | ICD-10-CM | POA: Diagnosis not present

## 2023-01-20 DIAGNOSIS — Z6823 Body mass index (BMI) 23.0-23.9, adult: Secondary | ICD-10-CM

## 2023-01-20 DIAGNOSIS — R000 Tachycardia, unspecified: Secondary | ICD-10-CM | POA: Diagnosis present

## 2023-01-20 DIAGNOSIS — Z8619 Personal history of other infectious and parasitic diseases: Secondary | ICD-10-CM

## 2023-01-20 DIAGNOSIS — R112 Nausea with vomiting, unspecified: Principal | ICD-10-CM | POA: Diagnosis present

## 2023-01-20 DIAGNOSIS — Z793 Long term (current) use of hormonal contraceptives: Secondary | ICD-10-CM

## 2023-01-20 DIAGNOSIS — E785 Hyperlipidemia, unspecified: Secondary | ICD-10-CM | POA: Diagnosis present

## 2023-01-20 DIAGNOSIS — E876 Hypokalemia: Secondary | ICD-10-CM | POA: Diagnosis present

## 2023-01-20 DIAGNOSIS — Z794 Long term (current) use of insulin: Secondary | ICD-10-CM

## 2023-01-20 DIAGNOSIS — E1142 Type 2 diabetes mellitus with diabetic polyneuropathy: Secondary | ICD-10-CM | POA: Diagnosis present

## 2023-01-20 DIAGNOSIS — K3184 Gastroparesis: Secondary | ICD-10-CM | POA: Diagnosis present

## 2023-01-20 DIAGNOSIS — E860 Dehydration: Secondary | ICD-10-CM | POA: Diagnosis present

## 2023-01-20 DIAGNOSIS — I10 Essential (primary) hypertension: Secondary | ICD-10-CM | POA: Diagnosis present

## 2023-01-20 DIAGNOSIS — Z888 Allergy status to other drugs, medicaments and biological substances status: Secondary | ICD-10-CM

## 2023-01-20 DIAGNOSIS — Z89611 Acquired absence of right leg above knee: Secondary | ICD-10-CM

## 2023-01-20 DIAGNOSIS — G546 Phantom limb syndrome with pain: Secondary | ICD-10-CM | POA: Diagnosis present

## 2023-01-20 DIAGNOSIS — Z833 Family history of diabetes mellitus: Secondary | ICD-10-CM

## 2023-01-20 DIAGNOSIS — N179 Acute kidney failure, unspecified: Secondary | ICD-10-CM | POA: Diagnosis present

## 2023-01-20 DIAGNOSIS — D751 Secondary polycythemia: Secondary | ICD-10-CM | POA: Diagnosis present

## 2023-01-20 DIAGNOSIS — E43 Unspecified severe protein-calorie malnutrition: Secondary | ICD-10-CM | POA: Diagnosis present

## 2023-01-20 DIAGNOSIS — Z79899 Other long term (current) drug therapy: Secondary | ICD-10-CM

## 2023-01-20 DIAGNOSIS — E1143 Type 2 diabetes mellitus with diabetic autonomic (poly)neuropathy: Principal | ICD-10-CM | POA: Diagnosis present

## 2023-01-20 DIAGNOSIS — G894 Chronic pain syndrome: Secondary | ICD-10-CM | POA: Diagnosis present

## 2023-01-20 DIAGNOSIS — Z89612 Acquired absence of left leg above knee: Secondary | ICD-10-CM

## 2023-01-21 ENCOUNTER — Emergency Department (HOSPITAL_COMMUNITY): Payer: Medicare Other

## 2023-01-21 ENCOUNTER — Other Ambulatory Visit: Payer: Self-pay

## 2023-01-21 ENCOUNTER — Encounter (HOSPITAL_COMMUNITY): Payer: Self-pay | Admitting: Emergency Medicine

## 2023-01-21 DIAGNOSIS — E1310 Other specified diabetes mellitus with ketoacidosis without coma: Secondary | ICD-10-CM | POA: Diagnosis not present

## 2023-01-21 LAB — COMPREHENSIVE METABOLIC PANEL
ALT: 12 U/L (ref 0–44)
AST: 15 U/L (ref 15–41)
Albumin: 1.7 g/dL — ABNORMAL LOW (ref 3.5–5.0)
Alkaline Phosphatase: 159 U/L — ABNORMAL HIGH (ref 38–126)
Anion gap: 16 — ABNORMAL HIGH (ref 5–15)
BUN: 12 mg/dL (ref 6–20)
CO2: 18 mmol/L — ABNORMAL LOW (ref 22–32)
Calcium: 8.7 mg/dL — ABNORMAL LOW (ref 8.9–10.3)
Chloride: 104 mmol/L (ref 98–111)
Creatinine, Ser: 1.58 mg/dL — ABNORMAL HIGH (ref 0.44–1.00)
GFR, Estimated: 45 mL/min — ABNORMAL LOW (ref >60)
Glucose, Bld: 205 mg/dL — ABNORMAL HIGH (ref 70–99)
Potassium: 2.8 mmol/L — ABNORMAL LOW (ref 3.5–5.1)
Sodium: 138 mmol/L (ref 135–145)
Total Bilirubin: 0.8 mg/dL (ref <1.2)
Total Protein: 6.8 g/dL (ref 6.5–8.1)

## 2023-01-21 LAB — CBC WITH DIFFERENTIAL/PLATELET
Abs Immature Granulocytes: 0.03 10*3/uL (ref 0.00–0.07)
Basophils Absolute: 0.1 10*3/uL (ref 0.0–0.1)
Basophils Relative: 1 %
Eosinophils Absolute: 0.1 10*3/uL (ref 0.0–0.5)
Eosinophils Relative: 1 %
HCT: 51.7 % — ABNORMAL HIGH (ref 36.0–46.0)
Hemoglobin: 16.8 g/dL — ABNORMAL HIGH (ref 12.0–15.0)
Immature Granulocytes: 0 %
Lymphocytes Relative: 19 %
Lymphs Abs: 1.4 10*3/uL (ref 0.7–4.0)
MCH: 27.4 pg (ref 26.0–34.0)
MCHC: 32.5 g/dL (ref 30.0–36.0)
MCV: 84.2 fL (ref 80.0–100.0)
Monocytes Absolute: 0.4 10*3/uL (ref 0.1–1.0)
Monocytes Relative: 5 %
Neutro Abs: 5.1 10*3/uL (ref 1.7–7.7)
Neutrophils Relative %: 74 %
Platelets: 494 10*3/uL — ABNORMAL HIGH (ref 150–400)
RBC: 6.14 MIL/uL — ABNORMAL HIGH (ref 3.87–5.11)
RDW: 14.4 % (ref 11.5–15.5)
WBC: 7 10*3/uL (ref 4.0–10.5)
nRBC: 0 % (ref 0.0–0.2)

## 2023-01-21 LAB — BASIC METABOLIC PANEL
Anion gap: 8 (ref 5–15)
BUN: 15 mg/dL (ref 6–20)
CO2: 20 mmol/L — ABNORMAL LOW (ref 22–32)
Calcium: 8.2 mg/dL — ABNORMAL LOW (ref 8.9–10.3)
Chloride: 110 mmol/L (ref 98–111)
Creatinine, Ser: 1.41 mg/dL — ABNORMAL HIGH (ref 0.44–1.00)
GFR, Estimated: 51 mL/min — ABNORMAL LOW (ref >60)
Glucose, Bld: 117 mg/dL — ABNORMAL HIGH (ref 70–99)
Potassium: 3.1 mmol/L — ABNORMAL LOW (ref 3.5–5.1)
Sodium: 138 mmol/L (ref 135–145)

## 2023-01-21 LAB — GLUCOSE, CAPILLARY: Glucose-Capillary: 137 mg/dL — ABNORMAL HIGH (ref 70–99)

## 2023-01-21 LAB — MAGNESIUM
Magnesium: 1.7 mg/dL (ref 1.7–2.4)
Magnesium: 1.7 mg/dL (ref 1.7–2.4)

## 2023-01-21 LAB — PHOSPHORUS
Phosphorus: 4 mg/dL (ref 2.5–4.6)
Phosphorus: 3.5 mg/dL (ref 2.5–4.6)

## 2023-01-21 LAB — CBG MONITORING, ED
Glucose-Capillary: 249 mg/dL — ABNORMAL HIGH (ref 70–99)
Glucose-Capillary: 178 mg/dL — ABNORMAL HIGH (ref 70–99)
Glucose-Capillary: 105 mg/dL — ABNORMAL HIGH (ref 70–99)
Glucose-Capillary: 205 mg/dL — ABNORMAL HIGH (ref 70–99)

## 2023-01-21 LAB — HCG, SERUM, QUALITATIVE: Preg, Serum: NEGATIVE

## 2023-01-21 LAB — BETA-HYDROXYBUTYRIC ACID: Beta-Hydroxybutyric Acid: 2.89 mmol/L — ABNORMAL HIGH (ref 0.05–0.27)

## 2023-01-21 LAB — LIPASE, BLOOD: Lipase: 20 U/L (ref 11–51)

## 2023-01-21 MED ORDER — FAMOTIDINE 20 MG PO TABS
20.0000 mg | ORAL_TABLET | Freq: Two times a day (BID) | ORAL | Status: DC
  Administered 2023-01-21 – 2023-01-23 (×5): 20 mg via ORAL
  Filled 2023-01-21 (×5): qty 1

## 2023-01-21 MED ORDER — LACTATED RINGERS IV BOLUS
1000.0000 mL | Freq: Once | INTRAVENOUS | Status: AC
  Administered 2023-01-21: 1000 mL via INTRAVENOUS

## 2023-01-21 MED ORDER — POTASSIUM CHLORIDE 10 MEQ/100ML IV SOLN
10.0000 meq | INTRAVENOUS | Status: AC
  Administered 2023-01-21 (×4): 10 meq via INTRAVENOUS
  Filled 2023-01-21 (×4): qty 100

## 2023-01-21 MED ORDER — METHOCARBAMOL 1000 MG/10ML IJ SOLN
500.0000 mg | Freq: Four times a day (QID) | INTRAMUSCULAR | Status: DC | PRN
  Administered 2023-01-22: 500 mg via INTRAVENOUS
  Filled 2023-01-21: qty 10

## 2023-01-21 MED ORDER — POTASSIUM CHLORIDE CRYS ER 20 MEQ PO TBCR
40.0000 meq | EXTENDED_RELEASE_TABLET | Freq: Once | ORAL | Status: AC
  Administered 2023-01-21: 40 meq via ORAL
  Filled 2023-01-21: qty 2

## 2023-01-21 MED ORDER — HYDROMORPHONE HCL 1 MG/ML IJ SOLN
0.5000 mg | Freq: Once | INTRAMUSCULAR | Status: AC
  Administered 2023-01-21: 0.5 mg via INTRAVENOUS
  Filled 2023-01-21: qty 1

## 2023-01-21 MED ORDER — HYDROMORPHONE HCL 2 MG PO TABS
2.0000 mg | ORAL_TABLET | ORAL | Status: DC | PRN
  Administered 2023-01-21 – 2023-01-22 (×3): 2 mg via ORAL
  Filled 2023-01-21 (×4): qty 1

## 2023-01-21 MED ORDER — ACETAMINOPHEN 325 MG PO TABS: 650.0000 mg | ORAL_TABLET | Freq: Four times a day (QID) | ORAL | Status: DC | PRN

## 2023-01-21 MED ORDER — INSULIN GLARGINE-YFGN 100 UNIT/ML ~~LOC~~ SOLN
4.0000 [IU] | Freq: Two times a day (BID) | SUBCUTANEOUS | Status: DC
  Administered 2023-01-21 – 2023-01-23 (×5): 4 [IU] via SUBCUTANEOUS
  Filled 2023-01-21 (×8): qty 0.04

## 2023-01-21 MED ORDER — DOCUSATE SODIUM 100 MG PO CAPS
100.0000 mg | ORAL_CAPSULE | Freq: Two times a day (BID) | ORAL | Status: DC
  Administered 2023-01-22 – 2023-01-23 (×2): 100 mg via ORAL
  Filled 2023-01-21 (×5): qty 1

## 2023-01-21 MED ORDER — DROPERIDOL 2.5 MG/ML IJ SOLN
1.2500 mg | Freq: Once | INTRAMUSCULAR | Status: AC
  Administered 2023-01-21: 1.25 mg via INTRAVENOUS
  Filled 2023-01-21: qty 2

## 2023-01-21 MED ORDER — SODIUM CHLORIDE 0.9 % IV SOLN: INTRAVENOUS | Status: DC

## 2023-01-21 MED ORDER — INSULIN ASPART 100 UNIT/ML IJ SOLN: 0.0000 [IU] | INTRAMUSCULAR | Status: DC

## 2023-01-21 MED ORDER — POTASSIUM CHLORIDE IN NACL 20-0.9 MEQ/L-% IV SOLN
INTRAVENOUS | Status: DC
  Filled 2023-01-21: qty 1000

## 2023-01-21 MED ORDER — METOCLOPRAMIDE HCL 5 MG/ML IJ SOLN
10.0000 mg | Freq: Once | INTRAMUSCULAR | Status: AC
  Administered 2023-01-21: 10 mg via INTRAVENOUS
  Filled 2023-01-21: qty 2

## 2023-01-21 MED ORDER — CARVEDILOL 6.25 MG PO TABS
6.2500 mg | ORAL_TABLET | Freq: Two times a day (BID) | ORAL | Status: DC
  Administered 2023-01-21 – 2023-01-23 (×5): 6.25 mg via ORAL
  Filled 2023-01-21: qty 1
  Filled 2023-01-21: qty 2
  Filled 2023-01-21 (×2): qty 1
  Filled 2023-01-21: qty 2

## 2023-01-21 MED ORDER — DEXTROSE 50 % IV SOLN: 0.0000 mL | INTRAVENOUS | Status: DC | PRN

## 2023-01-21 MED ORDER — PROMETHAZINE (PHENERGAN) 6.25MG IN NS 50ML IVPB: 6.2500 mg | Freq: Four times a day (QID) | INTRAVENOUS | Status: DC | PRN

## 2023-01-21 MED ORDER — INSULIN ASPART 100 UNIT/ML IJ SOLN
0.0000 [IU] | INTRAMUSCULAR | Status: DC
  Administered 2023-01-21: 3 [IU] via SUBCUTANEOUS
  Administered 2023-01-21: 1 [IU] via SUBCUTANEOUS
  Administered 2023-01-22 (×3): 2 [IU] via SUBCUTANEOUS
  Administered 2023-01-23: 1 [IU] via SUBCUTANEOUS

## 2023-01-21 MED ORDER — AMLODIPINE BESYLATE 10 MG PO TABS
10.0000 mg | ORAL_TABLET | Freq: Every day | ORAL | Status: DC
  Administered 2023-01-21 – 2023-01-23 (×3): 10 mg via ORAL
  Filled 2023-01-21: qty 2
  Filled 2023-01-21 (×2): qty 1

## 2023-01-21 MED ORDER — METOCLOPRAMIDE HCL 5 MG PO TABS
10.0000 mg | ORAL_TABLET | Freq: Three times a day (TID) | ORAL | Status: DC
  Administered 2023-01-21 – 2023-01-23 (×7): 10 mg via ORAL
  Filled 2023-01-21: qty 1
  Filled 2023-01-21 (×2): qty 2
  Filled 2023-01-21: qty 1
  Filled 2023-01-21 (×3): qty 2

## 2023-01-21 MED ORDER — PROCHLORPERAZINE EDISYLATE 10 MG/2ML IJ SOLN: 5.0000 mg | INTRAMUSCULAR | Status: DC | PRN

## 2023-01-21 MED ORDER — HYDROMORPHONE HCL 1 MG/ML IJ SOLN
1.0000 mg | Freq: Once | INTRAMUSCULAR | Status: AC
Start: 2023-01-21 — End: 2023-01-21
  Administered 2023-01-21: 1 mg via INTRAVENOUS
  Filled 2023-01-21: qty 1

## 2023-01-21 MED ORDER — TRAZODONE HCL 50 MG PO TABS
150.0000 mg | ORAL_TABLET | Freq: Every day | ORAL | Status: DC
  Administered 2023-01-21 – 2023-01-22 (×2): 150 mg via ORAL
  Filled 2023-01-21 (×2): qty 1

## 2023-01-21 MED ORDER — INSULIN REGULAR(HUMAN) IN NACL 100-0.9 UT/100ML-% IV SOLN
INTRAVENOUS | Status: DC
  Administered 2023-01-21: 5.5 [IU]/h via INTRAVENOUS
  Filled 2023-01-21: qty 100

## 2023-01-21 MED ORDER — HYDROMORPHONE HCL 1 MG/ML IJ SOLN
0.5000 mg | INTRAMUSCULAR | Status: DC | PRN
  Administered 2023-01-21: 0.5 mg via INTRAVENOUS
  Filled 2023-01-21: qty 1

## 2023-01-21 MED ORDER — ACETAMINOPHEN 650 MG RE SUPP: 650.0000 mg | Freq: Four times a day (QID) | RECTAL | Status: DC | PRN

## 2023-01-21 MED ORDER — SODIUM CHLORIDE 0.9 % IV SOLN
12.5000 mg | Freq: Four times a day (QID) | INTRAVENOUS | Status: DC | PRN
  Administered 2023-01-22: 12.5 mg via INTRAVENOUS
  Filled 2023-01-21: qty 12.5

## 2023-01-21 MED ORDER — SODIUM CHLORIDE 0.9% FLUSH
3.0000 mL | Freq: Two times a day (BID) | INTRAVENOUS | Status: DC
  Administered 2023-01-21 – 2023-01-22 (×3): 3 mL via INTRAVENOUS

## 2023-01-21 MED ORDER — POTASSIUM CHLORIDE 10 MEQ/100ML IV SOLN
10.0000 meq | INTRAVENOUS | Status: AC
  Administered 2023-01-21 (×3): 10 meq via INTRAVENOUS
  Filled 2023-01-21 (×3): qty 100

## 2023-01-21 MED ORDER — ENOXAPARIN SODIUM 40 MG/0.4ML IJ SOSY
40.0000 mg | PREFILLED_SYRINGE | INTRAMUSCULAR | Status: DC
  Administered 2023-01-21: 40 mg via SUBCUTANEOUS
  Filled 2023-01-21: qty 0.4

## 2023-01-21 MED ORDER — LIDOCAINE 5 % EX PTCH
2.0000 | MEDICATED_PATCH | CUTANEOUS | Status: DC
  Administered 2023-01-22: 2 via TRANSDERMAL
  Filled 2023-01-21 (×3): qty 2

## 2023-01-21 MED ORDER — ONDANSETRON HCL 4 MG/2ML IJ SOLN
4.0000 mg | Freq: Four times a day (QID) | INTRAMUSCULAR | Status: DC
  Administered 2023-01-21 – 2023-01-23 (×9): 4 mg via INTRAVENOUS
  Filled 2023-01-21 (×9): qty 2

## 2023-01-21 MED ORDER — PREGABALIN 50 MG PO CAPS
50.0000 mg | ORAL_CAPSULE | Freq: Three times a day (TID) | ORAL | Status: DC
  Administered 2023-01-21 – 2023-01-23 (×7): 50 mg via ORAL
  Filled 2023-01-21: qty 2
  Filled 2023-01-21 (×2): qty 1
  Filled 2023-01-21: qty 2
  Filled 2023-01-21 (×3): qty 1

## 2023-01-21 MED ORDER — INSULIN GLARGINE-YFGN 100 UNIT/ML ~~LOC~~ SOLN
4.0000 [IU] | Freq: Two times a day (BID) | SUBCUTANEOUS | Status: DC
  Filled 2023-01-21 (×4): qty 0.04

## 2023-01-21 MED ORDER — SUCRALFATE 1 G PO TABS
1.0000 g | ORAL_TABLET | Freq: Three times a day (TID) | ORAL | Status: DC
  Administered 2023-01-21 – 2023-01-23 (×7): 1 g via ORAL
  Filled 2023-01-21 (×8): qty 1

## 2023-01-21 NOTE — H&P (Signed)
History and Physical    Patient: Kelli Hudson ZOX:[id number] DOB: 03-Mar-1990 DOA: 01/20/2023 DOS: the patient was seen and examined on 01/21/2023 PCP: Norm Salt, PA  Patient coming from: Home Medical readiness/disposition: Anticipate patient will be ready for discharge on 12/5 and will discharge back to home  Chief Complaint:  Chief Complaint  Patient presents with   Abdominal Pain   Emesis   HPI: Kelli Hudson is a 32 y.o. 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  She was last hospitalized in October of this year due to sepsis related to C. difficile colitis.  Since amputations patient has had difficulty with controlling her phantom limb pain and neuropathic pain.  At last discharge she was sent home on a fentanyl patch and Dilaudid.  She has also been on Neurontin chronically for her longstanding diabetic peripheral neuropathy.  She presented to the ER just after midnight via EMS home after complaining of abdominal pain with nausea and vomiting that began less than 24 hours prior to presentation.  Her blood pressure was markedly elevated and she was tachycardic with a heart rate of 130.  In the ED her sodium was 138, potassium 2.8, CO2 18 with an anion gap of 16, glucose 205, BUN 12, creatinine 1.58, alkaline phosphatase 159, albumin 1.7, GFR 45.  While in the ER patient was witnessed sticking her fingers down her throat and gagging in an attempt to induce emesis.  When I questioned the patient about this she stated she put her fingers in her mouth because she thought there was something in her throat.  When I went to interview the patient she was very drowsy sitting up in a stretcher in the hall.  I discussed with her plans to initiate a clear liquid diet and advance as tolerated.  Explained that  utilization of oral narcotics preferred over IV due to longer duration of action.  I discovered that she has not had any appropriate symptom control with the gabapentin noting her pain has actually worsened post amputation.  I explained to her that I would be discontinuing the gabapentin in favor of introducing Lyrica 50 mg p.o. 3 times daily.  She stated that oral Dilaudid did not work but I again emphasized that she would not be receiving IV narcotics due to decreased duration of action and the fact that we cannot send her home on IV narcotics.  Review of Systems: As mentioned in the history of present illness. All other systems reviewed and are negative.  Past Medical History:  Diagnosis Date   Acute H. pylori gastric ulcer    Coffee ground emesis    Diabetes mellitus (HCC)    Diabetic gastroparesis (HCC)    DKA (diabetic ketoacidosis) (HCC) 02/24/2021   Gastroparesis    GERD (gastroesophageal reflux disease)    Hypertension    Hyperthyroidism    Intractable nausea and vomiting 04/20/2021   Normocytic anemia 04/20/2020   Prolonged Q-T interval on ECG    Past Surgical History:  Procedure Laterality Date   AMPUTATION Bilateral 10/24/2022   Procedure: BILATERAL BELOW KNEE AMPUTATION;  Surgeon: Nadara Mustard, MD;  Location: [facility] OR;  Service: Orthopedics;  Laterality: Bilateral;   AMPUTATION Bilateral 10/08/2022   Procedure: BILATERAL LEG DEBRIDEMENT;  Surgeon: Nadara Mustard, MD;  Location: [facility] OR;  Service: Orthopedics;  Laterality: Bilateral;   AMPUTATION Bilateral 11/14/2022   Procedure: BILATERAL ABOVE KNEE  AMPUTATION;  Surgeon: Nadara Mustard, MD;  Location: [facility] OR;  Service: Orthopedics;  Laterality: Bilateral;   AMPUTATION TOE Left 03/10/2018   Procedure: AMPUTATION FIFTH TOE;  Surgeon: Vivi Barrack, DPM;  Location: [facility];  Service: Podiatry;  Laterality: Left;   APPLICATION OF WOUND VAC Bilateral 10/12/2022   Procedure: APPLICATION OF WOUND VAC BILATERAL LEGS;   Surgeon: Myrene Galas, MD;  Location: [facility] OR;  Service: Orthopedics;  Laterality: Bilateral;   APPLICATION OF WOUND VAC Bilateral 10/24/2022   Procedure: APPLICATION OF WOUND VAC;  Surgeon: Nadara Mustard, MD;  Location: [facility] OR;  Service: Orthopedics;  Laterality: Bilateral;   BIOPSY  01/28/2020   Procedure: BIOPSY;  Surgeon: Kathi Der, MD;  Location: [facility];  Service: Gastroenterology;;   BOTOX INJECTION  08/26/2021   Procedure: BOTOX INJECTION;  Surgeon: Sherrilyn Rist, MD;  Location: [facility];  Service: Gastroenterology;;   ESOPHAGOGASTRODUODENOSCOPY N/A 01/28/2020   Procedure: ESOPHAGOGASTRODUODENOSCOPY (EGD);  Surgeon: Kathi Der, MD;  Location: [facility];  Service: Gastroenterology;  Laterality: N/A;   ESOPHAGOGASTRODUODENOSCOPY (EGD) WITH PROPOFOL Left 09/08/2015   Procedure: ESOPHAGOGASTRODUODENOSCOPY (EGD) WITH PROPOFOL;  Surgeon: Willis Modena, MD;  Location: [facility] ENDOSCOPY;  Service: Endoscopy;  Laterality: Left;   ESOPHAGOGASTRODUODENOSCOPY (EGD) WITH PROPOFOL N/A 08/26/2021   Procedure: ESOPHAGOGASTRODUODENOSCOPY (EGD) WITH PROPOFOL;  Surgeon: Sherrilyn Rist, MD;  Location: [facility];  Service: Gastroenterology;  Laterality: N/A;   GASTRIC STIMULATOR IMPLANT SURGERY  06/2022   at [facility]   I & D EXTREMITY Bilateral 10/12/2022   Procedure: IRRIGATION AND  DEBRIDEMENT  BILATERAL LEGS;  Surgeon: Myrene Galas, MD;  Location: [facility] OR;  Service: Orthopedics;  Laterality: Bilateral;   I & D EXTREMITY Right 10/13/2022   Procedure: RIGHT THIGH WOUND VAC EXCHANGE;  Surgeon: Roby Lofts, MD;  Location: [facility] OR;  Service: Orthopedics;  Laterality: Right;   I & D EXTREMITY Bilateral 10/17/2022   Procedure: BILATERAL THIGH AND LEG DEBRIDEMENT, PARTIAL BILATERAL FOOT AMPUTATION;  Surgeon: Nadara Mustard, MD;  Location: [facility] OR;  Service: Orthopedics;  Laterality: Bilateral;   I & D EXTREMITY Bilateral 11/05/2022   Procedure: BILATERAL THIGH DEBRIDEMENT;  Surgeon: Nadara Mustard, MD;  Location: [facility] OR;  Service: Orthopedics;  Laterality: Bilateral;   I & D EXTREMITY Bilateral 11/07/2022   Procedure: BILATERAL THIGH AND LEG DEBRIDEMENT;  Surgeon: Nadara Mustard, MD;  Location: [facility] OR;  Service: Orthopedics;  Laterality: Bilateral;   I & D EXTREMITY Bilateral 11/12/2022   Procedure: BILATERAL LEG DEBRIDEMENTS;  Surgeon: Nadara Mustard, MD;  Location: [facility] OR;  Service: Orthopedics;  Laterality: Bilateral;   IRRIGATION AND DEBRIDEMENT FOOT Bilateral 10/05/2022   Procedure: IRRIGATION AND DEBRIDEMENT FOOT;  Surgeon: [name], DPM;  Location: [facility];  Service: Orthopedics/Podiatry;  Laterality: Bilateral;   WISDOM TOOTH EXTRACTION     Social History:  reports that she has never smoked. She has never used smokeless tobacco. She reports current alcohol use of about 1.0 standard drink of alcohol per week. She reports that she does not use drugs.  Allergies  Allergen Reactions   Ibuprofen Other (See Comments)    07/23/22 - Worsening kidney function after 3 doses of ibuprofen 400mg .  Creatinine increased from 1.3 to 2.  Creatinine returned to normal after stopping.   Lisinopril Cough, Other (See Comments), Itching and Rash    Breakout in rash    Family History  Problem Relation Age of Onset   Lung cancer Mother  Diabetes Mother    Bipolar disorder Father    Liver cancer Maternal Grandfather    Diabetes Maternal Aunt        x 2   Pancreatic cancer Maternal Uncle    Prostate cancer Maternal Uncle    Breast cancer Other        maternal great aunt   Heart disease Other    Ovarian cancer Other        maternal great aunt   Kidney disease Other        maternal great aunt   Colon cancer Neg Hx    Stomach cancer Neg Hx     Prior to Admission medications   Medication Sig Start Date End Date Taking? Authorizing Provider  acetaminophen (TYLENOL) 325 MG tablet Take 2 tablets (650 mg total) by mouth every 6 (six) hours as needed for mild pain, fever or headache.  11/22/22  Yes Azucena Fallen, MD  amLODipine (NORVASC) 10 MG tablet Take 1 tablet (10 mg total) by mouth daily. 12/04/22  Yes Elgergawy, Leana Roe, MD  ascorbic acid (VITAMIN C) 1000 MG tablet Take 1 tablet (1,000 mg total) by mouth daily. 11/22/22  Yes Azucena Fallen, MD  carvedilol (COREG) 6.25 MG tablet Take 1 tablet (6.25 mg total) by mouth 2 (two) times daily with a meal. 12/03/22  Yes Elgergawy, Leana Roe, MD  etonogestrel (NEXPLANON) 68 MG IMPL implant 1 each by Subdermal route continuous.   Yes [provider]  famotidine (PEPCID) 20 MG tablet Take 1 tablet (20 mg total) by mouth 2 (two) times daily. 12/03/22  Yes Elgergawy, Leana Roe, MD  feeding supplement, GLUCERNA SHAKE, (GLUCERNA SHAKE) LIQD Take 237 mLs by mouth 3 (three) times daily between meals. 11/22/22  Yes Azucena Fallen, MD  furosemide (LASIX) 20 MG tablet Take 1 tablet (20 mg total) by mouth daily as needed for edema or fluid. 12/03/22  Yes Elgergawy, Leana Roe, MD  gabapentin (NEURONTIN) 300 MG capsule Take 1 capsule (300 mg total) by mouth every 8 (eight) hours. 11/22/22  Yes Azucena Fallen, MD  HYDROmorphone (DILAUDID) 2 MG tablet Take 1 tablet (2 mg total) by mouth every 4 (four) hours as needed for severe pain (pain score 7-10) or moderate pain (pain score 4-6). 12/03/22  Yes Elgergawy, Leana Roe, MD  insulin aspart (NOVOLOG) 100 UNIT/ML injection Inject 0-9 Units into the skin 3 (three) times daily with meals. 12/03/22  Yes Elgergawy, Leana Roe, MD  insulin detemir (LEVEMIR FLEXTOUCH) 100 UNIT/ML FlexPen Inject 4 Units into the skin 2 (two) times daily. 11/22/22  Yes Azucena Fallen, MD  MAGNESIUM PO Take 1 tablet by mouth daily.   Yes [provider]  methocarbamol (ROBAXIN) 500 MG tablet Take 500 mg by mouth 3 (three) times daily as needed for muscle spasms. 11/22/22  Yes [provider]  metoCLOPramide (REGLAN) 10 MG tablet Take 1 tablet (10 mg total) by mouth every 8 (eight)  hours. 11/22/22  Yes Azucena Fallen, MD  Multiple Vitamin (MULTIVITAMIN WITH MINERALS) TABS tablet Take 1 tablet by mouth daily. 11/22/22  Yes Azucena Fallen, MD  spironolactone (ALDACTONE) 25 MG tablet Take 25 mg by mouth daily. 01/05/23  Yes [provider]  sucralfate (CARAFATE) 1 g tablet Take 1 g by mouth in the morning, at noon, and at bedtime. 08/28/22  Yes [provider]  traZODone (DESYREL) 150 MG tablet Take 150 mg by mouth at bedtime. 01/14/23  Yes [provider]  Continuous  Glucose Sensor (DEXCOM G6 SENSOR) MISC 1 Device by Does not apply route as directed. 06/18/22   Shamleffer, Konrad Dolores, MD  Continuous Glucose Transmitter (DEXCOM G6 TRANSMITTER) MISC 1 Device by Does not apply route as directed. 06/18/22   Shamleffer, Konrad Dolores, MD  fentaNYL (DURAGESIC) 50 MCG/HR Place 1 patch onto the skin every 3 (three) days. Patient not taking: Reported on 01/21/2023 12/03/22   Elgergawy, Leana Roe, MD  Insulin Pen Needle 32G X 4 MM MISC 1 Device by Does not apply route in the morning, at noon, in the evening, and at bedtime. 06/18/22   Shamleffer, Konrad Dolores, MD  nutrition supplement, JUVEN, (JUVEN) PACK Take 1 packet by mouth 2 (two) times daily between meals. Patient not taking: Reported on 01/21/2023 11/22/22   Azucena Fallen, MD  vancomycin (VANCOCIN) 125 MG capsule Take 1 capsule (125 mg total) by mouth 4 (four) times daily. Patient not taking: Reported on 01/21/2023 12/08/22   Elgergawy, Leana Roe, MD    Physical Exam: Vitals:   01/21/23 0606 01/21/23 0805 01/21/23 0902 01/21/23 1109  BP:  (!) 177/117  101/72  Pulse: (!) 104 (!) 110  98  Resp: 16 14  (!) 8  Temp:   98.4 F (36.9 C)   TempSrc:   Oral   SpO2: 100% 100%  100%   Constitutional: NAD, calm, comfortable Respiratory: clear to auscultation bilaterally, no wheezing, no crackles. Normal respiratory effort. No accessory muscle use.  Cardiovascular: Regular tachycardic rate  and rhythm, no murmurs / rubs / gallops. No extremity edema. 2+ pedal pulses.   Abdomen: no tenderness, no masses palpated. No hepatosplenomegaly. Bowel sounds positive.  Musculoskeletal: no clubbing / cyanosis. No joint deformity upper extremities.  Bilateral lower extremity AKA, good ROM, no contractures. Normal muscle tone.  Skin: no rashes, lesions, ulcers. No induration Neurologic: CN 2-12 grossly intact. Sensation intact, DTR normal. Strength 5/5 x all 4 extremities.  Psychiatric: Drowsy but oriented x 3.    Data Reviewed:  White count was 7000, hemoglobin 16.8 with previous hemoglobin 10.3, platelets 494,000.  Beta hydroxy butyrate acid was elevated at 2.9  Serum pregnancy negative  Acute abdominal film did not reveal any acute abnormalities.  Assessment and Plan: GERD secondary to gastroparesis with recurrent nausea and vomiting/dehydration Patient developed nausea and vomiting at home in context of known gastroparesis Labs consistent with dehydration: Anion gap acidosis, acute kidney injury, polycythemia (baseline hemoglobin 10.3 with )hemoglobin 16.8 at presentation Has been given fluid challenges in the ED and I will continue normal saline at 125 cc/h noting she still remains tachycardic Will allow clear liquids Continue Pepcid, Reglan and Carafate given underlying GERD from gastroparesis Scheduled Zofran and scopolamine patch  Acute hypokalemia Secondary to GI losses Has been given both IV and oral potassium in the ER Continue IV fluids for now Follow labs Preadmission Lasix  Uncontrolled neuropathic pain Has had chronic issues with diabetic neuropathy in lower extremities which has been made worse after bilateral AKA and now with increased pain and new component of phantom limb pain Continue gabapentin in favor Lyrica 50 mg 3 times daily Add lidocaine patches to apply to distal stumps.  This medication will need to be obtained over-the-counter after discharge since  insurance typically will not pay for lidocaine patches Utilize oral Dilaudid and avoid IV Dilaudid Unclear if patient would tolerate NSAIDs adjunctively Continue Robaxin Scheduled Colace twice daily to minimize risk for constipation  Diabetes mellitus 2 on insulin On Levemir at home and utilizes continuous  glucose monitor Although has elevated anion gap the acidosis/elevated gap is disproportionate to the elevation of glucose which is only minimal therefore suspect etiology of acidosis secondary to dehydration and not DKA Will discontinue insulin infusion in favor of long-acting insulin  Hypertension Blood pressure has been variable improved after administration of Norvasc and carvedilol  Severe protein calorie malnutrition Albumin 1.7 BMI unknown and would be artificially influenced by bilateral AKA Was prescribed vitamin C, Glucerna and Juven at home in October but has not been taking Juven Nutritional consult Ensure shakes 3 times daily   Advance Care Planning:   Code Status: Full Code   VTE prophylaxis: Lovenox  Consults: None  Family Communication: Patient only  Severity of Illness: The appropriate patient status for this patient is OBSERVATION. Observation status is judged to be reasonable and necessary in order to provide the required intensity of service to ensure the patient's safety. The patient's presenting symptoms, physical exam findings, and initial radiographic and laboratory data in the context of their medical condition is felt to place them at decreased risk for further clinical deterioration. Furthermore, it is anticipated that the patient will be medically stable for discharge from the hospital within 2 midnights of admission.   Author: Junious Silk, NP 01/21/2023 12:17 PM  For on call review [email or web address].

## 2023-01-21 NOTE — ED Notes (Signed)
Pt trying to force emesis by putting fingers down throat.

## 2023-01-21 NOTE — ED Notes (Signed)
Paged provider for order for infusion of dextrose since CBG is less than 250.

## 2023-01-21 NOTE — Inpatient Diabetes Management (Signed)
Inpatient Diabetes Program Recommendations  AACE/ADA: New Consensus Statement on Inpatient Glycemic Control (2015)  Target Ranges:  Prepandial:   less than 140 mg/dL      Peak postprandial:   less than 180 mg/dL (1-2 hours)      Critically ill patients:  140 - 180 mg/dL   Lab Results  Component Value Date   GLUCAP 178 (H) 01/21/2023   HGBA1C 5.9 (H) 11/18/2022    Review of Glycemic Control  Diabetes history: DM type 1 Outpatient Diabetes medications: Novolog 0-9 units tid, Levemir 4 units bid Current orders for Inpatient glycemic control:  IV insulin Endotool  A1c 5.9% on 10/1 Hx anemia  In the process of getting IV access. IV insulin for now. When transitioning note pt is very sensitive to insulin. Please order similar regimen to home doses of insulin.  Thanks,  Christena Deem RN, MSN, BC-ADM Inpatient Diabetes Coordinator Team Pager [phone] (8a-5p)

## 2023-01-21 NOTE — ED Provider Notes (Signed)
[place] EMERGENCY DEPARTMENT AT [facility] Provider Note   CSN: [id number] Arrival date & time: 01/20/23  2359     History {Add pertinent medical, surgical, social history, OB history to HPI:1} Chief Complaint  Patient presents with   Abdominal Pain   Emesis    Karlei Alloway is a 32 y.o. female.   Abdominal Pain Associated symptoms: vomiting   Emesis Associated symptoms: abdominal pain   Patient presents for***.  Medical history includes HTN, DM, gastroparesis, neuropathy, depression, anxiety, anemia, HLD, GERD.  She is on fentanyl patches and as needed p.o. Dilaudid for home pain control.  She was recently admitted to [facility] for intractable emesis.***.     Home Medications Prior to Admission medications   Medication Sig Start Date End Date Taking? Authorizing Provider  acetaminophen (TYLENOL) 325 MG tablet Take 2 tablets (650 mg total) by mouth every 6 (six) hours as needed for mild pain, fever or headache. 11/22/22   Azucena Fallen, MD  amLODipine (NORVASC) 10 MG tablet Take 1 tablet (10 mg total) by mouth daily. 12/04/22   Elgergawy, Leana Roe, MD  ascorbic acid (VITAMIN C) 1000 MG tablet Take 1 tablet (1,000 mg total) by mouth daily. 11/22/22   Azucena Fallen, MD  carvedilol (COREG) 6.25 MG tablet Take 1 tablet (6.25 mg total) by mouth 2 (two) times daily with a meal. 12/03/22   Elgergawy, Leana Roe, MD  Continuous Glucose Sensor (DEXCOM G6 SENSOR) MISC 1 Device by Does not apply route as directed. 06/18/22   Shamleffer, Konrad Dolores, MD  Continuous Glucose Transmitter (DEXCOM G6 TRANSMITTER) MISC 1 Device by Does not apply route as directed. 06/18/22   Shamleffer, Konrad Dolores, MD  etonogestrel (NEXPLANON) 68 MG IMPL implant 1 each by Subdermal route continuous.    [provider]  famotidine (PEPCID) 20 MG tablet Take 1 tablet (20 mg total) by mouth 2 (two) times daily. 12/03/22   Elgergawy, Leana Roe, MD  feeding  supplement, GLUCERNA SHAKE, (GLUCERNA SHAKE) LIQD Take 237 mLs by mouth 3 (three) times daily between meals. 11/22/22   Azucena Fallen, MD  fentaNYL (DURAGESIC) 50 MCG/HR Place 1 patch onto the skin every 3 (three) days. 12/03/22   Elgergawy, Leana Roe, MD  furosemide (LASIX) 20 MG tablet Take 1 tablet (20 mg total) by mouth daily as needed for edema or fluid. 12/03/22   Elgergawy, Leana Roe, MD  gabapentin (NEURONTIN) 300 MG capsule Take 1 capsule (300 mg total) by mouth every 8 (eight) hours. 11/22/22   Azucena Fallen, MD  HYDROmorphone (DILAUDID) 2 MG tablet Take 1 tablet (2 mg total) by mouth every 4 (four) hours as needed for severe pain (pain score 7-10) or moderate pain (pain score 4-6). 12/03/22   Elgergawy, Leana Roe, MD  insulin aspart (NOVOLOG) 100 UNIT/ML injection Inject 0-9 Units into the skin 3 (three) times daily with meals. 12/03/22   Elgergawy, Leana Roe, MD  insulin detemir (LEVEMIR FLEXTOUCH) 100 UNIT/ML FlexPen Inject 4 Units into the skin 2 (two) times daily. 11/22/22   Azucena Fallen, MD  Insulin Pen Needle 32G X 4 MM MISC 1 Device by Does not apply route in the morning, at noon, in the evening, and at bedtime. 06/18/22   Shamleffer, Konrad Dolores, MD  MAGNESIUM PO Take 1 tablet by mouth daily.    [provider]  methocarbamol (ROBAXIN) 500 MG tablet Take 500 mg by mouth 3 (three) times daily. 11/22/22   [provider]  metoCLOPramide (REGLAN) 10 MG tablet Take 1 tablet (10 mg total) by mouth every 8 (eight) hours. 11/22/22   Azucena Fallen, MD  Multiple Vitamin (MULTIVITAMIN WITH MINERALS) TABS tablet Take 1 tablet by mouth daily. 11/22/22   Azucena Fallen, MD  nutrition supplement, JUVEN, [employer]) PACK Take 1 packet by mouth 2 (two) times daily between meals. 11/22/22   Azucena Fallen, MD  OVER THE COUNTER MEDICATION Take 1 tablet by mouth daily. Nervive    [provider]  sucralfate (CARAFATE) 1 g tablet Take 1 g by mouth  in the morning, at noon, and at bedtime. 08/28/22   [provider]  vancomycin (VANCOCIN) 125 MG capsule Take 1 capsule (125 mg total) by mouth 4 (four) times daily. 12/08/22   Elgergawy, Leana Roe, MD      Allergies    Ibuprofen and Lisinopril    Review of Systems   Review of Systems  Gastrointestinal:  Positive for abdominal pain and vomiting.    Physical Exam Updated Vital Signs There were no vitals taken for this visit. Physical Exam  ED Results / Procedures / Treatments   Labs (all labs ordered are listed, but only abnormal results are displayed) Labs Reviewed - No data to display  EKG None  Radiology No results found.  Procedures Procedures  {Document cardiac monitor, telemetry assessment procedure when appropriate:1}  Medications Ordered in ED Medications - No data to display  ED Course/ Medical Decision Making/ A&P   {   Click here for ABCD2, HEART and other calculatorsREFRESH Note before signing :1}                              Medical Decision Making  ***  {Document critical care time when appropriate:1} {Document review of labs and clinical decision tools ie heart score, Chads2Vasc2 etc:1}  {Document your independent review of radiology images, and any outside records:1} {Document your discussion with family members, caretakers, and with consultants:1} {Document social determinants of health affecting pt's care:1} {Document your decision making why or why not admission, treatments were needed:1} Final Clinical Impression(s) / ED Diagnoses Final diagnoses:  None    Rx / DC Orders ED Discharge Orders     None

## 2023-01-21 NOTE — ED Notes (Signed)
Pt was stuck twice. Wasn't successful.

## 2023-01-21 NOTE — ED Notes (Signed)
ED TO INPATIENT HANDOFF REPORT  ED Nurse Name and Phone #: [phone]  S Name/Age/Gender Kelli Hudson 32 y.o. female Room/Bed: H011C/H011C  Code Status   Code Status: Full Code  Home/SNF/Other Home Patient oriented to: self, place, time, and situation Is this baseline? Yes   Triage Complete: Triage complete  Chief Complaint Intractable nausea and vomiting [R11.2]  Triage Note BIBA per EMS: pt coming from home w/ c/o abd pain, N/V that began today. Hx gastroparesis. Bilateral amputee. Given fentanyl IN & 4mg  zofran IM en route. Pt hard stick.  188/123 130 HR  98%RA CBG 157   Allergies Allergies  Allergen Reactions   Ibuprofen Other (See Comments)    07/23/22 - Worsening kidney function after 3 doses of ibuprofen 400mg .  Creatinine increased from 1.3 to 2.  Creatinine returned to normal after stopping.   Lisinopril Cough, Other (See Comments), Itching and Rash    Breakout in rash    Level of Care/Admitting Diagnosis ED Disposition     ED Disposition  Admit   Condition  --   Comment  Hospital Area: [facility] [[id number]]  Level of Care: Telemetry Medical [104]  May place patient in observation at [facility] or [facility] if equivalent level of care is available:: No  Covid Evaluation: Asymptomatic - no recent exposure (last 10 days) testing not required  Diagnosis: Intractable nausea and vomiting [720114]  Admitting Physician: Burnadette Pop [[id number]]  Attending Physician: Burnadette Pop [[id number]]          B Medical/Surgery History Past Medical History:  Diagnosis Date   Acute H. pylori gastric ulcer    Coffee ground emesis    Diabetes mellitus (HCC)    Diabetic gastroparesis (HCC)    DKA (diabetic ketoacidosis) (HCC) 02/24/2021   Gastroparesis    GERD (gastroesophageal reflux disease)    Hypertension    Hyperthyroidism    Intractable nausea and vomiting 04/20/2021   Normocytic anemia 04/20/2020   Prolonged Q-T  interval on ECG    Past Surgical History:  Procedure Laterality Date   AMPUTATION Bilateral 10/24/2022   Procedure: BILATERAL BELOW KNEE AMPUTATION;  Surgeon: Nadara Mustard, MD;  Location: [facility] OR;  Service: Orthopedics;  Laterality: Bilateral;   AMPUTATION Bilateral 10/08/2022   Procedure: BILATERAL LEG DEBRIDEMENT;  Surgeon: Nadara Mustard, MD;  Location: [facility] OR;  Service: Orthopedics;  Laterality: Bilateral;   AMPUTATION Bilateral 11/14/2022   Procedure: BILATERAL ABOVE KNEE AMPUTATION;  Surgeon: Nadara Mustard, MD;  Location: [facility] OR;  Service: Orthopedics;  Laterality: Bilateral;   AMPUTATION TOE Left 03/10/2018   Procedure: AMPUTATION FIFTH TOE;  Surgeon: Vivi Barrack, DPM;  Location: [facility];  Service: Podiatry;  Laterality: Left;   APPLICATION OF WOUND VAC Bilateral 10/12/2022   Procedure: APPLICATION OF WOUND VAC BILATERAL LEGS;  Surgeon: Myrene Galas, MD;  Location: [facility] OR;  Service: Orthopedics;  Laterality: Bilateral;   APPLICATION OF WOUND VAC Bilateral 10/24/2022   Procedure: APPLICATION OF WOUND VAC;  Surgeon: Nadara Mustard, MD;  Location: [facility] OR;  Service: Orthopedics;  Laterality: Bilateral;   BIOPSY  01/28/2020   Procedure: BIOPSY;  Surgeon: Kathi Der, MD;  Location: [facility];  Service: Gastroenterology;;   BOTOX INJECTION  08/26/2021   Procedure: BOTOX INJECTION;  Surgeon: Sherrilyn Rist, MD;  Location: [facility];  Service: Gastroenterology;;   ESOPHAGOGASTRODUODENOSCOPY N/A 01/28/2020   Procedure: ESOPHAGOGASTRODUODENOSCOPY (EGD);  Surgeon: Kathi Der, MD;  Location: [facility];  Service: Gastroenterology;  Laterality:  N/A;   ESOPHAGOGASTRODUODENOSCOPY (EGD) WITH PROPOFOL Left 09/08/2015   Procedure: ESOPHAGOGASTRODUODENOSCOPY (EGD) WITH PROPOFOL;  Surgeon: Willis Modena, MD;  Location: [facility] ENDOSCOPY;  Service: Endoscopy;  Laterality: Left;   ESOPHAGOGASTRODUODENOSCOPY (EGD) WITH PROPOFOL N/A 08/26/2021   Procedure:  ESOPHAGOGASTRODUODENOSCOPY (EGD) WITH PROPOFOL;  Surgeon: Sherrilyn Rist, MD;  Location: [facility];  Service: Gastroenterology;  Laterality: N/A;   GASTRIC STIMULATOR IMPLANT SURGERY  06/2022   at [facility]   I & D EXTREMITY Bilateral 10/12/2022   Procedure: IRRIGATION AND  DEBRIDEMENT  BILATERAL LEGS;  Surgeon: Myrene Galas, MD;  Location: [facility] OR;  Service: Orthopedics;  Laterality: Bilateral;   I & D EXTREMITY Right 10/13/2022   Procedure: RIGHT THIGH WOUND VAC EXCHANGE;  Surgeon: Roby Lofts, MD;  Location: [facility] OR;  Service: Orthopedics;  Laterality: Right;   I & D EXTREMITY Bilateral 10/17/2022   Procedure: BILATERAL THIGH AND LEG DEBRIDEMENT, PARTIAL BILATERAL FOOT AMPUTATION;  Surgeon: Nadara Mustard, MD;  Location: [facility] OR;  Service: Orthopedics;  Laterality: Bilateral;   I & D EXTREMITY Bilateral 11/05/2022   Procedure: BILATERAL THIGH DEBRIDEMENT;  Surgeon: Nadara Mustard, MD;  Location: [facility] OR;  Service: Orthopedics;  Laterality: Bilateral;   I & D EXTREMITY Bilateral 11/07/2022   Procedure: BILATERAL THIGH AND LEG DEBRIDEMENT;  Surgeon: Nadara Mustard, MD;  Location[facility];  Service: Orthopedics;  Laterality: Bilateral;   I & D EXTREMITY Bilateral 11/12/2022   Procedure: BILATERAL LEG DEBRIDEMENTS;  Surgeon: Nadara Mustard, MD;  Location: [facility] OR;  Service: Orthopedics;  Laterality: Bilateral;   IRRIGATION AND DEBRIDEMENT FOOT Bilateral 10/05/2022   Procedure: IRRIGATION AND DEBRIDEMENT FOOT;  Surgeon: [name], DPM;  Location: [facility];  Service: Orthopedics/Podiatry;  Laterality: Bilateral;   WISDOM TOOTH EXTRACTION       A IV Location/Drains/Wounds Patient Lines/Drains/Airways Status     Active Line/Drains/Airways     Name Placement date Placement time Site Days   Peripheral IV 01/21/23 22 G Left;Posterior Hand 01/21/23  0115  Hand  less than 1            Intake/Output Last 24 hours  Intake/Output Summary (Last 24 hours) at 01/21/2023 1534 Last data filed at 01/21/2023  1257 Gross per 24 hour  Intake 2408.73 ml  Output --  Net 2408.73 ml    Labs/Imaging Results for orders placed or performed during the hospital encounter of 01/20/23 (from the past 48 hour(s))  Comprehensive metabolic panel     Status: Abnormal   Collection Time: 01/21/23  1:14 AM  Result Value Ref Range   Sodium 138 135 - 145 mmol/L   Potassium 2.8 (L) 3.5 - 5.1 mmol/L   Chloride 104 98 - 111 mmol/L   CO2 18 (L) 22 - 32 mmol/L   Glucose, Bld 205 (H) 70 - 99 mg/dL    Comment: Glucose reference range applies only to samples taken after fasting for at least 8 hours.   BUN 12 6 - 20 mg/dL   Creatinine, Ser 8.11 (H) 0.44 - 1.00 mg/dL   Calcium 8.7 (L) 8.9 - 10.3 mg/dL   Total Protein 6.8 6.5 - 8.1 g/dL   Albumin 1.7 (L) 3.5 - 5.0 g/dL   AST 15 15 - 41 U/L   ALT 12 0 - 44 U/L   Alkaline Phosphatase 159 (H) 38 - 126 U/L   Total Bilirubin 0.8 <1.2 mg/dL   GFR, Estimated 45 (L) >60 mL/min    Comment: (NOTE) Calculated using the CKD-EPI  Creatinine Equation (2021)    Anion gap 16 (H) 5 - 15    Comment: Performed at [facility], [address], [place], [place] [address]  Lipase, blood     Status: None   Collection Time: 01/21/23  1:14 AM  Result Value Ref Range   Lipase 20 11 - 51 U/L    Comment: Performed at [facility] Lab, [address] [place], [place] [address]  CBC with Diff     Status: Abnormal   Collection Time: 01/21/23  1:14 AM  Result Value Ref Range   WBC 7.0 4.0 - 10.5 K/uL   RBC 6.14 (H) 3.87 - 5.11 MIL/uL   Hemoglobin 16.8 (H) 12.0 - 15.0 g/dL   HCT 95.2 (H) 84.1 - 32.4 %   MCV 84.2 80.0 - 100.0 fL   MCH 27.4 26.0 - 34.0 pg   MCHC 32.5 30.0 - 36.0 g/dL   RDW 40.1 02.7 - 25.3 %   Platelets 494 (H) 150 - 400 K/uL   nRBC 0.0 0.0 - 0.2 %   Neutrophils Relative % 74 %   Neutro Abs 5.1 1.7 - 7.7 K/uL   Lymphocytes Relative 19 %   Lymphs Abs 1.4 0.7 - 4.0 K/uL   Monocytes Relative 5 %   Monocytes Absolute 0.4 0.1 - 1.0 K/uL   Eosinophils Relative 1 %    Eosinophils Absolute 0.1 0.0 - 0.5 K/uL   Basophils Relative 1 %   Basophils Absolute 0.1 0.0 - 0.1 K/uL   Immature Granulocytes 0 %   Abs Immature Granulocytes 0.03 0.00 - 0.07 K/uL    Comment: Performed at [facility], [address], [place], [place] [address]  hCG, serum, qualitative     Status: None   Collection Time: 01/21/23  1:14 AM  Result Value Ref Range   Preg, Serum NEGATIVE NEGATIVE    Comment:        THE SENSITIVITY OF THIS METHODOLOGY IS >10 mIU/mL. Performed at [facility] Lab, [address], [place], [place] [address]   Beta-hydroxybutyric acid     Status: Abnormal   Collection Time: 01/21/23  1:14 AM  Result Value Ref Range   Beta-Hydroxybutyric Acid 2.89 (H) 0.05 - 0.27 mmol/L    Comment: Performed at [facility], [address] [place], [place] [address]  POC CBG, ED     Status: Abnormal   Collection Time: 01/21/23  8:27 AM  Result Value Ref Range   Glucose-Capillary 249 (H) 70 - 99 mg/dL    Comment: Glucose reference range applies only to samples taken after fasting for at least 8 hours.  CBG monitoring, ED     Status: Abnormal   Collection Time: 01/21/23  9:36 AM  Result Value Ref Range   Glucose-Capillary 178 (H) 70 - 99 mg/dL    Comment: Glucose reference range applies only to samples taken after fasting for at least 8 hours.  CBG monitoring, ED     Status: Abnormal   Collection Time: 01/21/23 10:55 AM  Result Value Ref Range   Glucose-Capillary 105 (H) 70 - 99 mg/dL    Comment: Glucose reference range applies only to samples taken after fasting for at least 8 hours.  Basic metabolic panel     Status: Abnormal   Collection Time: 01/21/23 11:47 AM  Result Value Ref Range   Sodium 138 135 - 145 mmol/L   Potassium 3.1 (L) 3.5 - 5.1 mmol/L   Chloride 110  98 - 111 mmol/L   CO2 20 (L) 22 - 32 mmol/L   Glucose, Bld 117 (H) 70 - 99 mg/dL    Comment: Glucose reference range applies only to samples taken after fasting for at least 8  hours.   BUN 15 6 - 20 mg/dL   Creatinine, Ser 4.09 (H) 0.44 - 1.00 mg/dL   Calcium 8.2 (L) 8.9 - 10.3 mg/dL   GFR, Estimated 51 (L) >60 mL/min    Comment: (NOTE) Calculated using the CKD-EPI Creatinine Equation (2021)    Anion gap 8 5 - 15    Comment: Performed at [facility], [address] [place], [place] [address]  Magnesium     Status: None   Collection Time: 01/21/23 11:58 AM  Result Value Ref Range   Magnesium 1.7 1.7 - 2.4 mg/dL    Comment: Performed at [facility], [address], [place], [place] [address]  Phosphorus     Status: None   Collection Time: 01/21/23 11:58 AM  Result Value Ref Range   Phosphorus 4.0 2.5 - 4.6 mg/dL    Comment: Performed at [facility], [address] [place], [place] [address]   DG Abdomen Acute W/Chest  Result Date: 01/21/2023 CLINICAL DATA:  Abdominal pain. EXAM: DG ABDOMEN ACUTE WITH 1 VIEW CHEST COMPARISON:  12/01/2022 FINDINGS: There is no evidence of dilated bowel loops or free intraperitoneal air. High density material is seen in the lumen of the nondistended stomach. Probable phlebolith identified over the left anatomic pelvis. Heart size and mediastinal contours are within normal limits. Both lungs are clear. IMPRESSION: Negative abdominal radiographs.  No acute cardiopulmonary disease. Electronically Signed   By: Kennith Center M.D.   On: 01/21/2023 04:41    Pending Labs Unresulted Labs (From admission, onward)     Start     Ordered   01/22/23 0500  Comprehensive metabolic panel  Tomorrow morning,   R        01/21/23 1217   01/22/23 0500  CBC  Tomorrow morning,   R        01/21/23 1217   01/21/23 1329  Magnesium  Now then every 6 hours,   R (with TIMED occurrences)      01/21/23 0735   01/21/23 1329  Phosphorus  Now then every 6 hours,   R (with TIMED occurrences)      01/21/23 0735   01/21/23 0803  Urine Culture (for pregnant, neutropenic or urologic patients or patients with an indwelling urinary catheter)   (Urine Labs)  Once,   R       Question:  Indication  Answer:  Dysuria   01/21/23 0802   01/21/23 0802  Rapid urine drug screen (hospital performed)  ONCE - STAT,   STAT        01/21/23 0802   01/21/23 0012  Urinalysis, Routine w reflex microscopic -Urine, Clean Catch  Once,   URGENT       Question:  Specimen Source  Answer:  Urine, Clean Catch   01/21/23 0013            Vitals/Pain Today's Vitals   01/21/23 1259 01/21/23 1259 01/21/23 1302 01/21/23 1419  BP:  (!) 146/97  122/77  Pulse:  (!) 103  97  Resp:  16  12  Temp:   97.9 F (36.6 C)   TempSrc:      SpO2:  100%  100%  PainSc: 0-No pain   Asleep  Isolation Precautions No active isolations  Medications Medications  methocarbamol (ROBAXIN) injection 500 mg (has no administration in time range)  ondansetron (ZOFRAN) injection 4 mg (4 mg Intravenous Given 01/21/23 1134)  HYDROmorphone (DILAUDID) tablet 2 mg (2 mg Oral Given 01/21/23 1140)  carvedilol (COREG) tablet 6.25 mg (6.25 mg Oral Given 01/21/23 0824)  amLODipine (NORVASC) tablet 10 mg (10 mg Oral Given 01/21/23 0824)  metoCLOPramide (REGLAN) tablet 10 mg (10 mg Oral Given 01/21/23 1442)  famotidine (PEPCID) tablet 20 mg (20 mg Oral Given 01/21/23 0825)  sucralfate (CARAFATE) tablet 1 g (1 g Oral Given 01/21/23 1136)  pregabalin (LYRICA) capsule 50 mg (50 mg Oral Given 01/21/23 0834)  docusate sodium (COLACE) capsule 100 mg (100 mg Oral Not Given 01/21/23 0825)  lidocaine (LIDODERM) 5 % 2 patch (0 patches Transdermal Hold 01/21/23 0933)  traZODone (DESYREL) tablet 150 mg (has no administration in time range)  insulin aspart (novoLOG) injection 0-9 Units ( Subcutaneous Not Given 01/21/23 1109)  0.9 %  sodium chloride infusion ( Intravenous New Bag/Given 01/21/23 1139)  insulin glargine-yfgn (SEMGLEE) injection 4 Units (4 Units Subcutaneous Given 01/21/23 1310)  sodium chloride flush (NS) 0.9 % injection 3 mL (3 mLs Intravenous Not Given 01/21/23 1302)  acetaminophen  (TYLENOL) tablet 650 mg (has no administration in time range)    Or  acetaminophen (TYLENOL) suppository 650 mg (has no administration in time range)  promethazine (PHENERGAN) 12.5 mg in sodium chloride 0.9 % 50 mL IVPB (has no administration in time range)  HYDROmorphone (DILAUDID) injection 1 mg (1 mg Intravenous Given 01/21/23 0115)  metoCLOPramide (REGLAN) injection 10 mg (10 mg Intravenous Given 01/21/23 0115)  lactated ringers bolus 1,000 mL (0 mLs Intravenous Stopped 01/21/23 0424)  potassium chloride SA (KLOR-CON M) CR tablet 40 mEq (40 mEq Oral Given 01/21/23 0331)  potassium chloride 10 mEq in 100 mL IVPB (0 mEq Intravenous Stopped 01/21/23 0812)  HYDROmorphone (DILAUDID) injection 0.5 mg (0.5 mg Intravenous Given 01/21/23 0329)  lactated ringers bolus 1,000 mL (0 mLs Intravenous Stopped 01/21/23 0527)  droperidol (INAPSINE) 2.5 MG/ML injection 1.25 mg (1.25 mg Intravenous Given 01/21/23 0816)  potassium chloride 10 mEq in 100 mL IVPB (0 mEq Intravenous Stopped 01/21/23 1257)  lactated ringers bolus 1,000 mL (0 mLs Intravenous Stopped 01/21/23 0934)  potassium chloride SA (KLOR-CON M) CR tablet 40 mEq (40 mEq Oral Given 01/21/23 1442)    Mobility Bilateral BKA, does not have prosthetics with her     Focused Assessments Abdominal pain generalized   R Recommendations: See Admitting Provider Note  Report given to:   Additional Notes:

## 2023-01-21 NOTE — ED Notes (Signed)
Pt placed on cardiac monitoring and CCMD notified.

## 2023-01-21 NOTE — ED Triage Notes (Signed)
BIBA per EMS: pt coming from home w/ c/o abd pain, N/V that began today. Hx gastroparesis. Bilateral amputee. Given fentanyl IN & 4mg  zofran IM en route. Pt hard stick.  188/123 130 HR  98%RA CBG 157

## 2023-01-22 ENCOUNTER — Other Ambulatory Visit: Payer: Self-pay

## 2023-01-22 DIAGNOSIS — K219 Gastro-esophageal reflux disease without esophagitis: Secondary | ICD-10-CM | POA: Diagnosis present

## 2023-01-22 DIAGNOSIS — E785 Hyperlipidemia, unspecified: Secondary | ICD-10-CM | POA: Diagnosis present

## 2023-01-22 DIAGNOSIS — N179 Acute kidney failure, unspecified: Secondary | ICD-10-CM | POA: Diagnosis present

## 2023-01-22 DIAGNOSIS — E876 Hypokalemia: Secondary | ICD-10-CM | POA: Diagnosis present

## 2023-01-22 DIAGNOSIS — Z888 Allergy status to other drugs, medicaments and biological substances status: Secondary | ICD-10-CM | POA: Diagnosis not present

## 2023-01-22 DIAGNOSIS — E1142 Type 2 diabetes mellitus with diabetic polyneuropathy: Secondary | ICD-10-CM | POA: Diagnosis present

## 2023-01-22 DIAGNOSIS — I10 Essential (primary) hypertension: Secondary | ICD-10-CM | POA: Diagnosis present

## 2023-01-22 DIAGNOSIS — G546 Phantom limb syndrome with pain: Secondary | ICD-10-CM | POA: Diagnosis present

## 2023-01-22 DIAGNOSIS — K3184 Gastroparesis: Secondary | ICD-10-CM | POA: Diagnosis present

## 2023-01-22 DIAGNOSIS — G894 Chronic pain syndrome: Secondary | ICD-10-CM | POA: Diagnosis present

## 2023-01-22 DIAGNOSIS — Z89612 Acquired absence of left leg above knee: Secondary | ICD-10-CM | POA: Diagnosis not present

## 2023-01-22 DIAGNOSIS — E43 Unspecified severe protein-calorie malnutrition: Secondary | ICD-10-CM | POA: Diagnosis present

## 2023-01-22 DIAGNOSIS — R109 Unspecified abdominal pain: Secondary | ICD-10-CM | POA: Diagnosis present

## 2023-01-22 DIAGNOSIS — E1143 Type 2 diabetes mellitus with diabetic autonomic (poly)neuropathy: Secondary | ICD-10-CM | POA: Diagnosis present

## 2023-01-22 DIAGNOSIS — Z89611 Acquired absence of right leg above knee: Secondary | ICD-10-CM | POA: Diagnosis not present

## 2023-01-22 DIAGNOSIS — Z794 Long term (current) use of insulin: Secondary | ICD-10-CM | POA: Diagnosis not present

## 2023-01-22 DIAGNOSIS — E1310 Other specified diabetes mellitus with ketoacidosis without coma: Secondary | ICD-10-CM | POA: Diagnosis not present

## 2023-01-22 DIAGNOSIS — E860 Dehydration: Secondary | ICD-10-CM | POA: Diagnosis present

## 2023-01-22 DIAGNOSIS — D751 Secondary polycythemia: Secondary | ICD-10-CM | POA: Diagnosis present

## 2023-01-22 DIAGNOSIS — Z8619 Personal history of other infectious and parasitic diseases: Secondary | ICD-10-CM | POA: Diagnosis not present

## 2023-01-22 DIAGNOSIS — Z79899 Other long term (current) drug therapy: Secondary | ICD-10-CM | POA: Diagnosis not present

## 2023-01-22 DIAGNOSIS — Z6823 Body mass index (BMI) 23.0-23.9, adult: Secondary | ICD-10-CM | POA: Diagnosis not present

## 2023-01-22 DIAGNOSIS — Z793 Long term (current) use of hormonal contraceptives: Secondary | ICD-10-CM | POA: Diagnosis not present

## 2023-01-22 DIAGNOSIS — Z886 Allergy status to analgesic agent status: Secondary | ICD-10-CM | POA: Diagnosis not present

## 2023-01-22 DIAGNOSIS — R000 Tachycardia, unspecified: Secondary | ICD-10-CM | POA: Diagnosis present

## 2023-01-22 DIAGNOSIS — Z833 Family history of diabetes mellitus: Secondary | ICD-10-CM | POA: Diagnosis not present

## 2023-01-22 LAB — COMPREHENSIVE METABOLIC PANEL
ALT: 10 U/L (ref 0–44)
AST: 11 U/L — ABNORMAL LOW (ref 15–41)
Albumin: <1.5 g/dL — ABNORMAL LOW (ref 3.5–5.0)
Alkaline Phosphatase: 132 U/L — ABNORMAL HIGH (ref 38–126)
Anion gap: 10 (ref 5–15)
BUN: 16 mg/dL (ref 6–20)
CO2: 20 mmol/L — ABNORMAL LOW (ref 22–32)
Calcium: 8.2 mg/dL — ABNORMAL LOW (ref 8.9–10.3)
Chloride: 106 mmol/L (ref 98–111)
Creatinine, Ser: 1.4 mg/dL — ABNORMAL HIGH (ref 0.44–1.00)
GFR, Estimated: 52 mL/min — ABNORMAL LOW (ref >60)
Glucose, Bld: 180 mg/dL — ABNORMAL HIGH (ref 70–99)
Potassium: 3 mmol/L — ABNORMAL LOW (ref 3.5–5.1)
Sodium: 136 mmol/L (ref 135–145)
Total Bilirubin: 0.7 mg/dL (ref <1.2)
Total Protein: 5.5 g/dL — ABNORMAL LOW (ref 6.5–8.1)

## 2023-01-22 LAB — MAGNESIUM: Magnesium: 1.7 mg/dL (ref 1.7–2.4)

## 2023-01-22 LAB — GLUCOSE, CAPILLARY
Glucose-Capillary: 101 mg/dL — ABNORMAL HIGH (ref 70–99)
Glucose-Capillary: 122 mg/dL — ABNORMAL HIGH (ref 70–99)
Glucose-Capillary: 160 mg/dL — ABNORMAL HIGH (ref 70–99)
Glucose-Capillary: 161 mg/dL — ABNORMAL HIGH (ref 70–99)
Glucose-Capillary: 173 mg/dL — ABNORMAL HIGH (ref 70–99)
Glucose-Capillary: 84 mg/dL (ref 70–99)
Glucose-Capillary: 98 mg/dL (ref 70–99)

## 2023-01-22 LAB — PHOSPHORUS: Phosphorus: 3.5 mg/dL (ref 2.5–4.6)

## 2023-01-22 LAB — CBC
HCT: 32.9 % — ABNORMAL LOW (ref 36.0–46.0)
Hemoglobin: 10.2 g/dL — ABNORMAL LOW (ref 12.0–15.0)
MCH: 27.2 pg (ref 26.0–34.0)
MCHC: 31 g/dL (ref 30.0–36.0)
MCV: 87.7 fL (ref 80.0–100.0)
Platelets: 593 10*3/uL — ABNORMAL HIGH (ref 150–400)
RBC: 3.75 MIL/uL — ABNORMAL LOW (ref 3.87–5.11)
RDW: 14.6 % (ref 11.5–15.5)
WBC: 9.8 10*3/uL (ref 4.0–10.5)
nRBC: 0 % (ref 0.0–0.2)

## 2023-01-22 MED ORDER — HYDROMORPHONE HCL 1 MG/ML IJ SOLN
0.5000 mg | Freq: Once | INTRAMUSCULAR | Status: AC
  Administered 2023-01-22: 0.5 mg via INTRAVENOUS
  Filled 2023-01-22: qty 0.5

## 2023-01-22 MED ORDER — POTASSIUM CHLORIDE CRYS ER 20 MEQ PO TBCR
40.0000 meq | EXTENDED_RELEASE_TABLET | Freq: Once | ORAL | Status: DC
  Filled 2023-01-22: qty 2

## 2023-01-22 MED ORDER — SODIUM CHLORIDE 0.9 % IV SOLN: INTRAVENOUS | Status: DC

## 2023-01-22 MED ORDER — MAGNESIUM SULFATE 2 GM/50ML IV SOLN
2.0000 g | Freq: Once | INTRAVENOUS | Status: AC
  Administered 2023-01-22: 2 g via INTRAVENOUS
  Filled 2023-01-22: qty 50

## 2023-01-22 MED ORDER — POTASSIUM CHLORIDE CRYS ER 20 MEQ PO TBCR: 40.0000 meq | EXTENDED_RELEASE_TABLET | ORAL | Status: DC

## 2023-01-22 MED ORDER — POTASSIUM CHLORIDE 10 MEQ/100ML IV SOLN
10.0000 meq | INTRAVENOUS | Status: AC
  Administered 2023-01-22 (×6): 10 meq via INTRAVENOUS
  Filled 2023-01-22 (×6): qty 100

## 2023-01-22 MED ORDER — HYDROMORPHONE HCL 1 MG/ML IJ SOLN
0.5000 mg | INTRAMUSCULAR | Status: DC | PRN
  Administered 2023-01-22 – 2023-01-23 (×7): 0.5 mg via INTRAVENOUS
  Filled 2023-01-22 (×7): qty 0.5

## 2023-01-22 NOTE — Plan of Care (Signed)
  Problem: Activity: Goal: Risk for activity intolerance will decrease Outcome: Progressing   Problem: Elimination: Goal: Will not experience complications related to bowel motility Outcome: Progressing Goal: Will not experience complications related to urinary retention Outcome: Progressing   Problem: Safety: Goal: Ability to remain free from injury will improve Outcome: Progressing   

## 2023-01-22 NOTE — Progress Notes (Signed)
PROGRESS NOTE  Kelli Hudson  IHK:[id number] DOB: May 26, 1990 DOA: 01/20/2023 PCP: Norm Salt, PA   Brief Narrative: Patient is a 32 year old female with history of diabetes type 2 , DKA with numerous admissions, hypertension, hyperlipidemia, GERD, peripheral neuropathy, bilateral lower extremity AKA who presented with complaint of abdominal pain, intractable nausea and vomiting. On presentation, lab work was consistent with DKA with CO2 15, anion gap of 16, potassium of 2.8. She was started on IV insulin.  Insulin now transitioned to long-acting and sliding.  Hospital course remarkable for persistent complaint of abdominal pain requesting for IV Dilaudid for nausea and vomiting  Assessment & Plan:  Active Problems:   Intractable nausea and vomiting  Abdomen pain/nausea/vomiting: Likely from diabetic gastroparesis.  Continue gentle  IV fluids, antiemetics, pain management.  Patient continues to ask for IV Dilaudid.  Long discussion at the bedside  about  narcotics causing nausea and vomiting worse.  Continue Reglan Diet advanced to full liquid today  DKA: Apparently resolved.  Continue current insulin regimen.  Monitor blood sugars   Hypokalemia: Continue supplementation and monitoring.  Magnesium also supplemented   AKI: Continue IV fluids.  Monitor BMP.  Avoid nephrotoxins.  Her baseline creatinine is normal   Sinus tachycardia/hypertension: Continue home medications.    Severe protein calorie medicine: Dietitian consulted.  Albumin of 1.7         DVT prophylaxis:     Code Status: Full Code  Family Communication: None at bedside  Patient status:Inpatient  Patient is from :home  Anticipated discharge VF:[facility]  Estimated DC date: After resolution of nausea/vomiting, abdominal pain, tolerance of solid diet   Consultants: None  Procedures:None  Antimicrobials:  Anti-infectives (From admission, onward)    None       Subjective: Patient seen and  examined at bedside today.  Hemodynamically stable.  Complains of abdominal pain.  No vomiting this morning.  Requesting for IV Dilaudid.  Objective: Vitals:   01/22/23 0500 01/22/23 0757 01/22/23 0934 01/22/23 1100  BP:  (!) 155/92 (!) 155/92   Pulse:  (!) 108    Resp:  18    Temp:  98.5 F (36.9 C)    TempSrc:  Oral    SpO2:  100%    Weight: 68.9 kg   68.9 kg  Height:    5' 7.01" (1.702 m)    Intake/Output Summary (Last 24 hours) at 01/22/2023 1252 Last data filed at 01/22/2023 0845 Gross per 24 hour  Intake 1279.05 ml  Output --  Net 1279.05 ml   Filed Weights   01/22/23 0500 01/22/23 1100  Weight: 68.9 kg 68.9 kg    Examination:  General exam: In moderate distress due to abdominal pain HEENT: PERRL Respiratory system:  no wheezes or crackles  Cardiovascular system: S1 & S2 heard, RRR.  Gastrointestinal system: Abdomen is nondistended, soft and nontender.  Bowel sounds present Central nervous system: Alert and oriented Extremities: Bilateral AKA, no clubbing ,no cyanosis Skin: No rashes, no ulcers,no icterus     Data Reviewed: I have personally reviewed following labs and imaging studies  CBC: Recent Labs  Lab 01/21/23 0114 01/22/23 0521  WBC 7.0 9.8  NEUTROABS 5.1  --   HGB 16.8* 10.2*  HCT 51.7* 32.9*  MCV 84.2 87.7  PLT 494* 593*   Basic Metabolic Panel: Recent Labs  Lab 01/21/23 0114 01/21/23 1147 01/21/23 1158 01/21/23 1951 01/22/23 0521  NA 138 138  --   --  136  K 2.8* 3.1*  --   --  3.0*  CL 104 110  --   --  106  CO2 18* 20*  --   --  20*  GLUCOSE 205* 117*  --   --  180*  BUN 12 15  --   --  16  CREATININE 1.58* 1.41*  --   --  1.40*  CALCIUM 8.7* 8.2*  --   --  8.2*  MG  --   --  1.7 1.7 1.7  PHOS  --   --  4.0 3.5 3.5     No results found for this or any previous visit (from the past 240 hour(s)).   Radiology Studies: DG Abdomen Acute W/Chest  Result Date: 01/21/2023 CLINICAL DATA:  Abdominal pain. EXAM: DG ABDOMEN ACUTE  WITH 1 VIEW CHEST COMPARISON:  12/01/2022 FINDINGS: There is no evidence of dilated bowel loops or free intraperitoneal air. High density material is seen in the lumen of the nondistended stomach. Probable phlebolith identified over the left anatomic pelvis. Heart size and mediastinal contours are within normal limits. Both lungs are clear. IMPRESSION: Negative abdominal radiographs.  No acute cardiopulmonary disease. Electronically Signed   By: Kennith Center M.D.   On: 01/21/2023 04:41    Scheduled Meds:  amLODipine  10 mg Oral Daily   carvedilol  6.25 mg Oral BID WC   docusate sodium  100 mg Oral BID   famotidine  20 mg Oral BID   insulin aspart  0-9 Units Subcutaneous Q4H   insulin glargine-yfgn  4 Units Subcutaneous BID   lidocaine  2 patch Transdermal Q24H   metoCLOPramide  10 mg Oral Q8H   ondansetron (ZOFRAN) IV  4 mg Intravenous Q6H   potassium chloride  40 mEq Oral Once   pregabalin  50 mg Oral TID   sodium chloride flush  3 mL Intravenous Q12H   sucralfate  1 g Oral TID WC & HS   traZODone  150 mg Oral QHS   Continuous Infusions:  sodium chloride 100 mL/hr at 01/22/23 0956   potassium chloride 10 mEq (01/22/23 1129)   promethazine (PHENERGAN) injection (IM or IVPB) 12.5 mg (01/22/23 0233)     LOS: 0 days   Burnadette Pop, MD [facility] P12/06/2022, 12:52 PM

## 2023-01-22 NOTE — Progress Notes (Signed)
Responded to consult for IV. On arrival to room, pt asked to have a moment. Pt placed full length of fingers on her left hand in her mouth and stated, "It's stuck back there." Pt spit small amount of sputum into emesis bag.

## 2023-01-23 DIAGNOSIS — E1310 Other specified diabetes mellitus with ketoacidosis without coma: Secondary | ICD-10-CM | POA: Diagnosis not present

## 2023-01-23 LAB — GLUCOSE, CAPILLARY
Glucose-Capillary: 74 mg/dL (ref 70–99)
Glucose-Capillary: 81 mg/dL (ref 70–99)
Glucose-Capillary: 124 mg/dL — ABNORMAL HIGH (ref 70–99)

## 2023-01-23 LAB — BASIC METABOLIC PANEL
Anion gap: 7 (ref 5–15)
BUN: 11 mg/dL (ref 6–20)
CO2: 19 mmol/L — ABNORMAL LOW (ref 22–32)
Calcium: 8.3 mg/dL — ABNORMAL LOW (ref 8.9–10.3)
Chloride: 114 mmol/L — ABNORMAL HIGH (ref 98–111)
Creatinine, Ser: 1.34 mg/dL — ABNORMAL HIGH (ref 0.44–1.00)
GFR, Estimated: 54 mL/min — ABNORMAL LOW (ref >60)
Glucose, Bld: 71 mg/dL (ref 70–99)
Potassium: 3.5 mmol/L (ref 3.5–5.1)
Sodium: 140 mmol/L (ref 135–145)

## 2023-01-23 MED ORDER — METOCLOPRAMIDE HCL 10 MG PO TABS: 10.0000 mg | ORAL_TABLET | Freq: Three times a day (TID) | ORAL | 0 refills | Status: DC | PRN

## 2023-01-23 MED ORDER — POTASSIUM CHLORIDE CRYS ER 20 MEQ PO TBCR: 20.0000 meq | EXTENDED_RELEASE_TABLET | Freq: Two times a day (BID) | ORAL | 0 refills | Status: DC

## 2023-01-23 MED ORDER — POTASSIUM CHLORIDE CRYS ER 20 MEQ PO TBCR
40.0000 meq | EXTENDED_RELEASE_TABLET | Freq: Once | ORAL | Status: AC
  Administered 2023-01-23: 40 meq via ORAL
  Filled 2023-01-23: qty 2

## 2023-01-23 NOTE — TOC Initial Note (Addendum)
Transition of Care (TOC) - Initial/Assessment Note   Spoke to patient at beside.   Patient currently staying with her mother at : [address] , [place], [place] [address] will update face sheet  Patient has wheelchair, "thing over the commode", and shower chair at home.   Patient has transportation to medical appointments and can get prescriptions filled.   Patient will need transportation home at discharge. She does not have her wheelchair here and has no one who can bring her wheelchair to the hospital. Patient will need ambulance transport home. Patient in agreement.  Please notify TOC team if any needs arise and when to schedule transport home.   1125 Patient discharged requesting PTAR transportation to her mothers address, states her mother is at home waiting on her arrival. [place] called  Patient Details  Name: Kelli Hudson MRN: [id number] Date of Birth: 1990-09-15  Transition of Care [facility]) CM/SW Contact:    Kingsley Plan, RN Phone Number: 01/23/2023, 10:41 AM  Clinical Narrative:                   Expected Discharge Plan: Home/Self Care Barriers to Discharge: Continued Medical Work up   Patient Goals and CMS Choice Patient states their goals for this hospitalization and ongoing recovery are:: to return to home          Expected Discharge Plan and Services   Discharge Planning Services: CM Consult Post Acute Care Choice: NA Living arrangements for the past 2 months: Single Family Home                 DME Arranged: N/A DME Agency: NA       HH Arranged: NA          Prior Living Arrangements/Services Living arrangements for the past 2 months: Single Family Home Lives with:: Parents Patient language and need for interpreter reviewed:: Yes Do you feel safe going back to the place where you live?: Yes      Need for Family Participation in Patient Care: Yes (Comment) Care giver support system in place?: Yes (comment) Current home services:  DME Criminal Activity/Legal Involvement Pertinent to Current Situation/Hospitalization: No - Comment as needed  Activities of Daily Living   ADL Screening (condition at time of admission) Independently performs ADLs?: No Does the patient have a NEW difficulty with bathing/dressing/toileting/self-feeding that is expected to last >3 days?: Yes (Initiates electronic notice to provider for possible OT consult) Does the patient have a NEW difficulty with getting in/out of bed, walking, or climbing stairs that is expected to last >3 days?: Yes (Initiates electronic notice to provider for possible PT consult) Does the patient have a NEW difficulty with communication that is expected to last >3 days?: Yes (Initiates electronic notice to provider for possible SLP consult) Is the patient deaf or have difficulty hearing?: No Does the patient have difficulty seeing, even when wearing glasses/contacts?: No Does the patient have difficulty concentrating, remembering, or making decisions?: No  Permission Sought/Granted   Permission granted to share information with : No              Emotional Assessment Appearance:: Appears stated age Attitude/Demeanor/Rapport: Engaged Affect (typically observed): Accepting Orientation: : Oriented to Self, Oriented to Place, Oriented to  Time, Oriented to Situation Alcohol / Substance Use: Not Applicable Psych Involvement: No (comment)  Admission diagnosis:  Hypokalemia [E87.6] AKI (acute kidney injury) (HCC) [N17.9] Intractable nausea and vomiting [R11.2] Nausea and vomiting, unspecified vomiting type [R11.2] Patient Active Problem  List   Diagnosis Date Noted   Abscess of thigh 11/28/2022   C. 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   Hypokalemia 11/27/2022   Effusion, left knee 11/05/2022   MDD (major depressive disorder), recurrent episode, moderate (HCC) 10/16/2022   Necrotizing fasciitis of pelvic  region and thigh (HCC) 10/08/2022   Necrotizing fasciitis of lower leg (HCC) 10/08/2022   Necrotizing fasciitis (HCC) 10/08/2022   Sepsis due to cellulitis (HCC) 10/03/2022   Cellulitis of lower extremity 10/03/2022   Multinodular goiter 06/18/2022   Malnutrition of moderate degree 04/18/2022   Intractable vomiting with nausea 04/17/2022   DKA (diabetic ketoacidosis) (HCC) 03/04/2022   Refractory nausea and vomiting 12/25/2021   Diabetic gastroparesis (HCC) 12/07/2021   Intractable nausea and vomiting 10/09/2021   Drug-seeking behavior 09/21/2021   Essential hypertension 08/25/2021   GERD without esophagitis 08/25/2021   Hematemesis    Prolonged Q-T interval on ECG    Nausea and vomiting 07/20/2021   Hypomagnesemia 04/21/2021   Ulcerated, foot, left, with fat layer exposed (HCC) 04/20/2021   Uncontrolled type 1 diabetes mellitus with hyperglycemia, with long-term current use of insulin (HCC) 12/18/2020   DM type 1 (diabetes mellitus, type 1) (HCC) 12/18/2020   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) 11/09/2020   Diabetic retinopathy of both eyes associated with type 1 diabetes mellitus (HCC) 09/24/2020   Diabetic gastroparesis associated with type 1 diabetes mellitus (HCC) 08/12/2020   AKI (acute kidney injury) (HCC) 07/25/2020   Generalized abdominal pain 07/23/2020   Diabetic nephropathy associated with type 1 diabetes mellitus (HCC) 06/27/2020   Sepsis (HCC) 05/27/2020   HLD (hyperlipidemia) 05/23/2020   Sinus tachycardia 05/09/2020   Anemia 04/20/2020   Depression with anxiety 07/05/2019   Diabetic polyneuropathy associated with type 1 diabetes mellitus (HCC) 09/24/2017   Gastroparesis    Goiter 10/05/2009   Hypertension associated with diabetes (HCC) 10/05/2009   PCP:  Norm Salt, PA Pharmacy:   [phone] [facility] [place] [place] - [address] [place] [address] Phone: [phone] Fax:  [phone]  [facility] [address] [place] [address] Phone: [phone] Fax: [phone]  [facility] [address] [place] [address] Phone: [phone] Fax: [phone]     Social Determinants of Health (SDOH) Social History: SDOH Screenings   Food Insecurity: Patient Unable To Answer (01/22/2023)  Recent Concern: Food Insecurity - Food Insecurity Present (12/29/2022)   Received from [facility]  Housing: Patient Unable To Answer (01/22/2023)  Transportation Needs: Patient Unable To Answer (01/22/2023)  Utilities: Patient Unable To Answer (01/22/2023)  Alcohol Screen: Low Risk  (05/29/2021)  Depression (PHQ2-9): Low Risk  (05/29/2021)  Recent Concern: Depression (PHQ2-9) - Medium Risk (04/10/2021)  Financial Resource Strain: Low Risk  (02/13/2022)   Received from [facility]  Social Connections: Unknown (06/17/2021)   Received from [facility]  Stress: No Stress Concern Present (12/29/2022)   Received from [facility]  Tobacco Use: Low Risk  (01/21/2023)   SDOH Interventions:     Readmission Risk Interventions    11/27/2022    2:33 PM 05/08/2022    9:45 AM 03/24/2022    3:33 PM  Readmission Risk Prevention Plan  Transportation Screening Complete Complete Complete  PCP or Specialist Appt within 3-5 Days Not Complete    HRI or Home Care Consult Complete  Social Work Consult for Recovery Care Planning/Counseling Complete    Palliative Care Screening Not Applicable    Medication Review Oceanographer) Complete Complete Complete  PCP or Specialist appointment within 3-5 days of discharge  Complete Complete  HRI or Home Care Consult  Complete Complete  SW Recovery Care/Counseling Consult  Complete Complete  Palliative Care Screening  Not Applicable Not Applicable  Skilled Nursing Facility  Not Applicable Not Applicable

## 2023-01-23 NOTE — Progress Notes (Signed)
Discharge instructions discussed with patient. Patient states understanding. Patient would not look at this RN while going over instructions, sat in bed with blanket over her body. Spoke with case manager and PTAR being set up for transport home .

## 2023-01-23 NOTE — Discharge Summary (Signed)
Physician Discharge Summary  Kelli Hudson UJW:[id number] DOB: 1990/03/07 DOA: 01/20/2023  PCP: Norm Salt, PA  Admit date: 01/20/2023 Discharge date: 01/23/2023  Admitted From: Home Disposition:  Home  Discharge Condition:Stable CODE STATUS:FULL Diet recommendation: Carb Modified   Brief/Interim Summary: Patient is a 32 year old female with history of diabetes type 2 , DKA with numerous admissions, hypertension, hyperlipidemia, GERD, peripheral neuropathy, bilateral lower extremity AKA who presented with complaint of abdominal pain, intractable nausea and vomiting. On presentation, lab work was consistent with DKA with CO2 15, anion gap of 16, potassium of 2.8. She was started on IV insulin.  Insulin now transitioned to long-acting and sliding.  Hospital course remarkable for persistent complaint of abdominal pain requesting for IV Dilaudid for nausea and vomiting.  This morning she was comfortable, abdomen pain has resolved.  Tolerating diet and she wants to go home.  Medically stable for discharge.   Following problems were addressed during the hospitalization:  Abdomen pain/nausea/vomiting: Likely from diabetic gastroparesis.  Given   IV fluids, antiemetics, pain management.  Has chronic pain syndrome due to diabetic gastroparesis.  On oral Dilaudid at home.  Continue Reglan as needed Diet advanced to soft this morning.  Medically stable for discharge.   DKA: Apparently resolved.  Continue same regimen at home.  Hypokalemia: Continue supplementation .  Magnesium also supplemented   AKI: Improved with IV fluids   Sinus tachycardia/hypertension: Currently stable   Severe protein calorie medicine:  Albumin of 1.7  Debility: B/L AKA      Discharge Diagnoses:  Active Problems:   Intractable nausea and vomiting    Discharge Instructions  Discharge Instructions     Diet Carb Modified   Complete by: As directed    Discharge instructions   Complete by: As  directed    1)Please take prescribed medications as instructed 2)Follow up with your PCP in a week. 3)Monitor your blood sugars at home   Increase activity slowly   Complete by: As directed       Allergies as of 01/23/2023       Reactions   Ibuprofen Other (See Comments)   07/23/22 - Worsening kidney function after 3 doses of ibuprofen 400mg .  Creatinine increased from 1.3 to 2.  Creatinine returned to normal after stopping.   Lisinopril Cough, Other (See Comments), Itching, Rash   Breakout in rash        Medication List     STOP taking these medications    fentaNYL 50 MCG/HR Commonly known as: DURAGESIC   spironolactone 25 MG tablet Commonly known as: ALDACTONE   vancomycin 125 MG capsule Commonly known as: VANCOCIN       TAKE these medications    acetaminophen 325 MG tablet Commonly known as: TYLENOL Take 2 tablets (650 mg total) by mouth every 6 (six) hours as needed for mild pain, fever or headache.   amLODipine 10 MG tablet Commonly known as: NORVASC Take 1 tablet (10 mg total) by mouth daily.   ascorbic acid 1000 MG tablet Commonly known as: VITAMIN C Take 1 tablet (1,000 mg total) by mouth daily.   carvedilol 6.25 MG tablet Commonly known as: COREG Take 1 tablet (6.25 mg total) by mouth 2 (two) times daily with a meal.   Dexcom G6 Sensor Misc 1 Device by Does not apply route as directed.   Dexcom G6 Transmitter Misc 1 Device by Does not apply route as directed.   famotidine 20 MG tablet Commonly known as: PEPCID Take 1 tablet (20  mg total) by mouth 2 (two) times daily.   feeding supplement (GLUCERNA SHAKE) Liqd Take 237 mLs by mouth 3 (three) times daily between meals.   nutrition supplement (JUVEN) Pack Take 1 packet by mouth 2 (two) times daily between meals.   furosemide 20 MG tablet Commonly known as: LASIX Take 1 tablet (20 mg total) by mouth daily as needed for edema or fluid.   gabapentin 300 MG capsule Commonly known as:  NEURONTIN Take 1 capsule (300 mg total) by mouth every 8 (eight) hours.   HYDROmorphone 2 MG tablet Commonly known as: DILAUDID Take 1 tablet (2 mg total) by mouth every 4 (four) hours as needed for severe pain (pain score 7-10) or moderate pain (pain score 4-6).   insulin aspart 100 UNIT/ML injection Commonly known as: novoLOG Inject 0-9 Units into the skin 3 (three) times daily with meals.   Insulin Pen Needle 32G X 4 MM Misc 1 Device by Does not apply route in the morning, at noon, in the evening, and at bedtime.   Levemir FlexTouch 100 UNIT/ML FlexTouch Pen Generic drug: insulin detemir Inject 4 Units into the skin 2 (two) times daily.   MAGNESIUM PO Take 1 tablet by mouth daily.   methocarbamol 500 MG tablet Commonly known as: ROBAXIN Take 500 mg by mouth 3 (three) times daily as needed for muscle spasms.   metoCLOPramide 10 MG tablet Commonly known as: REGLAN Take 1 tablet (10 mg total) by mouth every 8 (eight) hours as needed for nausea. What changed:  when to take this reasons to take this   multivitamin with minerals Tabs tablet Take 1 tablet by mouth daily.   Nexplanon 68 MG Impl implant Generic drug: etonogestrel 1 each by Subdermal route continuous.   potassium chloride SA 20 MEQ tablet Commonly known as: KLOR-CON M Take 1 tablet (20 mEq total) by mouth 2 (two) times daily for 5 days.   sucralfate 1 g tablet Commonly known as: CARAFATE Take 1 g by mouth in the morning, at noon, and at bedtime.   traZODone 150 MG tablet Commonly known as: DESYREL Take 150 mg by mouth at bedtime.        Follow-up Information     Norm Salt, [place]. Schedule an appointment as soon as possible for a visit in 1 week(s).   Specialty: Physician Assistant Contact information: [address] [place] [address] [phone]                Allergies  Allergen Reactions   Ibuprofen Other (See Comments)    07/23/22 - Worsening kidney function after 3  doses of ibuprofen 400mg .  Creatinine increased from 1.3 to 2.  Creatinine returned to normal after stopping.   Lisinopril Cough, Other (See Comments), Itching and Rash    Breakout in rash    Consultations: None   Procedures/Studies: DG Abdomen Acute W/Chest  Result Date: 01/21/2023 CLINICAL DATA:  Abdominal pain. EXAM: DG ABDOMEN ACUTE WITH 1 VIEW CHEST COMPARISON:  12/01/2022 FINDINGS: There is no evidence of dilated bowel loops or free intraperitoneal air. High density material is seen in the lumen of the nondistended stomach. Probable phlebolith identified over the left anatomic pelvis. Heart size and mediastinal contours are within normal limits. Both lungs are clear. IMPRESSION: Negative abdominal radiographs.  No acute cardiopulmonary disease. Electronically Signed   By: Kennith Center M.D.   On: 01/21/2023 04:41      Subjective: Patient seen and examined at bedside today.  Hemodynamically stable.  Wants to go home today.  Denies abdomen pain, nausea vomiting  Discharge Exam: Vitals:   01/23/23 0359 01/23/23 0833  BP: 106/75 (!) 131/92  Pulse: 75 84  Resp: 18 17  Temp: 97.9 F (36.6 C) 98.6 F (37 C)  SpO2: 99% 100%   Vitals:   01/22/23 2008 01/23/23 0356 01/23/23 0359 01/23/23 0833  BP: 123/75  106/75 (!) 131/92  Pulse: 97  75 84  Resp: 18  18 17   Temp: 98.4 F (36.9 C)  97.9 F (36.6 C) 98.6 F (37 C)  TempSrc: Oral  Oral   SpO2: 100%  99% 100%  Weight:  67.4 kg    Height:        General: Pt is alert, awake, not in acute distress Cardiovascular: RRR, S1/S2 +, no rubs, no gallops Respiratory: CTA bilaterally, no wheezing, no rhonchi Abdominal: Soft, NT, ND, bowel sounds + Extremities: B/L AKA    The results of significant diagnostics from this hospitalization (including imaging, microbiology, ancillary and laboratory) are listed below for reference.     Microbiology: No results found for this or any previous visit (from the past 240 hour(s)).    Labs: BNP (last 3 results) Recent Labs    11/30/22 0754 12/01/22 0736 12/02/22 0312  BNP 1,098.0* 1,207.2* 1,252.9*   Basic Metabolic Panel: Recent Labs  Lab 01/21/23 0114 01/21/23 1147 01/21/23 1158 01/21/23 1951 01/22/23 0521 01/23/23 0607  NA 138 138  --   --  136 140  K 2.8* 3.1*  --   --  3.0* 3.5  CL 104 110  --   --  106 114*  CO2 18* 20*  --   --  20* 19*  GLUCOSE 205* 117*  --   --  180* 71  BUN 12 15  --   --  16 11  CREATININE 1.58* 1.41*  --   --  1.40* 1.34*  CALCIUM 8.7* 8.2*  --   --  8.2* 8.3*  MG  --   --  1.7 1.7 1.7  --   PHOS  --   --  4.0 3.5 3.5  --    Liver Function Tests: Recent Labs  Lab 01/21/23 0114 01/22/23 0521  AST 15 11*  ALT 12 10  ALKPHOS 159* 132*  BILITOT 0.8 0.7  PROT 6.8 5.5*  ALBUMIN 1.7* <1.5*   Recent Labs  Lab 01/21/23 0114  LIPASE 20   No results for input(s): "AMMONIA" in the last 168 hours. CBC: Recent Labs  Lab 01/21/23 0114 01/22/23 0521  WBC 7.0 9.8  NEUTROABS 5.1  --   HGB 16.8* 10.2*  HCT 51.7* 32.9*  MCV 84.2 87.7  PLT 494* 593*   Cardiac Enzymes: No results for input(s): "CKTOTAL", "CKMB", "CKMBINDEX", "TROPONINI" in the last 168 hours. BNP: Invalid input(s): "POCBNP" CBG: Recent Labs  Lab 01/22/23 1707 01/22/23 2009 01/22/23 2347 01/23/23 0401 01/23/23 0835  GLUCAP 161* 98 122* 74 81   D-Dimer No results for input(s): "DDIMER" in the last 72 hours. Hgb A1c No results for input(s): "HGBA1C" in the last 72 hours. Lipid Profile No results for input(s): "CHOL", "HDL", "LDLCALC", "TRIG", "CHOLHDL", "LDLDIRECT" in the last 72 hours. Thyroid function studies No results for input(s): "TSH", "T4TOTAL", "T3FREE", "THYROIDAB" in the last 72 hours.  Invalid input(s): "FREET3" Anemia work up No results for input(s): "VITAMINB12", "FOLATE", "FERRITIN", "TIBC", "IRON", "RETICCTPCT" in the last 72 hours. Urinalysis    Component Value Date/Time   COLORURINE YELLOW 05/05/2022 1416  APPEARANCEUR CLEAR 05/05/2022 1416   LABSPEC 1.016 05/05/2022 1416   PHURINE 6.0 05/05/2022 1416   GLUCOSEU >=500 (A) 05/05/2022 1416   GLUCOSEU NEGATIVE 05/22/2020 1417   HGBUR SMALL (A) 05/05/2022 1416   BILIRUBINUR NEGATIVE 05/05/2022 1416   KETONESUR NEGATIVE 05/05/2022 1416   PROTEINUR >=300 (A) 05/05/2022 1416   UROBILINOGEN 0.2 05/22/2020 1417   NITRITE NEGATIVE 05/05/2022 1416   LEUKOCYTESUR NEGATIVE 05/05/2022 1416   Sepsis Labs Recent Labs  Lab 01/21/23 0114 01/22/23 0521  WBC 7.0 9.8   Microbiology No results found for this or any previous visit (from the past 240 hour(s)).  Please note: You were cared for by a hospitalist during your hospital stay. Once you are discharged, your primary care physician will handle any further medical issues. Please note that NO REFILLS for any discharge medications will be authorized once you are discharged, as it is imperative that you return to your primary care physician (or establish a relationship with a primary care physician if you do not have one) for your post hospital discharge needs so that they can reassess your need for medications and monitor your lab values.    Time coordinating discharge: 40 minutes  SIGNED:   Burnadette Pop, MD  [facility] 01/23/2023, 10:52 AM Pager [phone]  If 7PM-7AM, please contact night-coverage [email or web address] Password [email or web address]

## 2023-01-28 ENCOUNTER — Other Ambulatory Visit: Payer: Self-pay

## 2023-01-28 DIAGNOSIS — Z89611 Acquired absence of right leg above knee: Secondary | ICD-10-CM

## 2023-02-01 ENCOUNTER — Emergency Department (HOSPITAL_COMMUNITY)
Admission: EM | Admit: 2023-02-01 | Discharge: 2023-02-02 | Disposition: A | Discharge status: Home / Self Care | Payer: Medicare Other | Attending: Emergency Medicine | Admitting: Emergency Medicine

## 2023-02-01 DIAGNOSIS — Z79899 Other long term (current) drug therapy: Secondary | ICD-10-CM | POA: Insufficient documentation

## 2023-02-01 DIAGNOSIS — I10 Essential (primary) hypertension: Secondary | ICD-10-CM | POA: Diagnosis not present

## 2023-02-01 DIAGNOSIS — E876 Hypokalemia: Secondary | ICD-10-CM | POA: Diagnosis not present

## 2023-02-01 DIAGNOSIS — R000 Tachycardia, unspecified: Secondary | ICD-10-CM | POA: Diagnosis not present

## 2023-02-01 DIAGNOSIS — R1012 Left upper quadrant pain: Secondary | ICD-10-CM | POA: Insufficient documentation

## 2023-02-01 DIAGNOSIS — K3184 Gastroparesis: Secondary | ICD-10-CM | POA: Insufficient documentation

## 2023-02-01 DIAGNOSIS — Z794 Long term (current) use of insulin: Secondary | ICD-10-CM | POA: Diagnosis not present

## 2023-02-01 DIAGNOSIS — R1011 Right upper quadrant pain: Secondary | ICD-10-CM | POA: Insufficient documentation

## 2023-02-01 DIAGNOSIS — R1013 Epigastric pain: Secondary | ICD-10-CM | POA: Insufficient documentation

## 2023-02-01 DIAGNOSIS — R112 Nausea with vomiting, unspecified: Secondary | ICD-10-CM

## 2023-02-01 DIAGNOSIS — E1143 Type 2 diabetes mellitus with diabetic autonomic (poly)neuropathy: Secondary | ICD-10-CM | POA: Diagnosis not present

## 2023-02-01 DIAGNOSIS — E119 Type 2 diabetes mellitus without complications: Secondary | ICD-10-CM | POA: Insufficient documentation

## 2023-02-01 LAB — COMPREHENSIVE METABOLIC PANEL
ALT: 15 U/L (ref 0–44)
AST: 14 U/L — ABNORMAL LOW (ref 15–41)
Albumin: 1.7 g/dL — ABNORMAL LOW (ref 3.5–5.0)
Alkaline Phosphatase: 140 U/L — ABNORMAL HIGH (ref 38–126)
Anion gap: 11 (ref 5–15)
BUN: 17 mg/dL (ref 6–20)
CO2: 22 mmol/L (ref 22–32)
Calcium: 8.8 mg/dL — ABNORMAL LOW (ref 8.9–10.3)
Chloride: 105 mmol/L (ref 98–111)
Creatinine, Ser: 1.26 mg/dL — ABNORMAL HIGH (ref 0.44–1.00)
GFR, Estimated: 59 mL/min — ABNORMAL LOW (ref >60)
Glucose, Bld: 266 mg/dL — ABNORMAL HIGH (ref 70–99)
Potassium: 2.9 mmol/L — ABNORMAL LOW (ref 3.5–5.1)
Sodium: 138 mmol/L (ref 135–145)
Total Bilirubin: 1 mg/dL (ref <1.2)
Total Protein: 5.8 g/dL — ABNORMAL LOW (ref 6.5–8.1)

## 2023-02-01 LAB — CBC WITH DIFFERENTIAL/PLATELET
Abs Immature Granulocytes: 0.02 10*3/uL (ref 0.00–0.07)
Basophils Absolute: 0.1 10*3/uL (ref 0.0–0.1)
Basophils Relative: 1 %
Eosinophils Absolute: 0.1 10*3/uL (ref 0.0–0.5)
Eosinophils Relative: 1 %
HCT: 37.4 % (ref 36.0–46.0)
Hemoglobin: 11.8 g/dL — ABNORMAL LOW (ref 12.0–15.0)
Immature Granulocytes: 0 %
Lymphocytes Relative: 20 %
Lymphs Abs: 1.8 10*3/uL (ref 0.7–4.0)
MCH: 27.8 pg (ref 26.0–34.0)
MCHC: 31.6 g/dL (ref 30.0–36.0)
MCV: 88 fL (ref 80.0–100.0)
Monocytes Absolute: 0.6 10*3/uL (ref 0.1–1.0)
Monocytes Relative: 7 %
Neutro Abs: 6.7 10*3/uL (ref 1.7–7.7)
Neutrophils Relative %: 71 %
Platelets: 458 10*3/uL — ABNORMAL HIGH (ref 150–400)
RBC: 4.25 MIL/uL (ref 3.87–5.11)
RDW: 14.7 % (ref 11.5–15.5)
WBC: 9.3 10*3/uL (ref 4.0–10.5)
nRBC: 0 % (ref 0.0–0.2)

## 2023-02-01 LAB — HCG, QUANTITATIVE, PREGNANCY: hCG, Beta Chain, Quant, S: 1 m[IU]/mL (ref <5)

## 2023-02-01 LAB — LIPASE, BLOOD: Lipase: 22 U/L (ref 11–51)

## 2023-02-01 LAB — MAGNESIUM: Magnesium: 1.7 mg/dL (ref 1.7–2.4)

## 2023-02-01 LAB — CBG MONITORING, ED: Glucose-Capillary: 223 mg/dL — ABNORMAL HIGH (ref 70–99)

## 2023-02-01 MED ORDER — METOCLOPRAMIDE HCL 5 MG/ML IJ SOLN
10.0000 mg | Freq: Once | INTRAMUSCULAR | Status: AC
  Administered 2023-02-01: 10 mg via INTRAVENOUS
  Filled 2023-02-01: qty 2

## 2023-02-01 MED ORDER — LACTATED RINGERS IV BOLUS
1000.0000 mL | Freq: Once | INTRAVENOUS | Status: AC
  Administered 2023-02-01: 1000 mL via INTRAVENOUS

## 2023-02-01 MED ORDER — HYDROMORPHONE HCL 1 MG/ML IJ SOLN
0.5000 mg | Freq: Once | INTRAMUSCULAR | Status: AC
  Administered 2023-02-01: 0.5 mg via INTRAVENOUS
  Filled 2023-02-01: qty 1

## 2023-02-01 MED ORDER — DIPHENHYDRAMINE HCL 50 MG/ML IJ SOLN
25.0000 mg | Freq: Once | INTRAMUSCULAR | Status: AC
  Administered 2023-02-01: 25 mg via INTRAVENOUS
  Filled 2023-02-01: qty 1

## 2023-02-01 MED ORDER — HYDROMORPHONE HCL 1 MG/ML IJ SOLN
1.0000 mg | Freq: Once | INTRAMUSCULAR | Status: AC
  Administered 2023-02-01: 1 mg via INTRAVENOUS
  Filled 2023-02-01: qty 1

## 2023-02-01 MED ORDER — MAGNESIUM OXIDE -MG SUPPLEMENT 400 (240 MG) MG PO TABS
800.0000 mg | ORAL_TABLET | Freq: Once | ORAL | Status: AC
  Administered 2023-02-01: 800 mg via ORAL
  Filled 2023-02-01: qty 2

## 2023-02-01 MED ORDER — FAMOTIDINE IN NACL 20-0.9 MG/50ML-% IV SOLN
20.0000 mg | Freq: Once | INTRAVENOUS | Status: AC
  Administered 2023-02-01: 20 mg via INTRAVENOUS
  Filled 2023-02-01: qty 50

## 2023-02-01 MED ORDER — POTASSIUM CHLORIDE 10 MEQ/100ML IV SOLN
10.0000 meq | Freq: Once | INTRAVENOUS | Status: AC
Start: 2023-02-01 — End: 2023-02-01
  Administered 2023-02-01: 10 meq via INTRAVENOUS
  Filled 2023-02-01: qty 100

## 2023-02-01 MED ORDER — MORPHINE SULFATE (PF) 4 MG/ML IV SOLN
4.0000 mg | Freq: Once | INTRAVENOUS | Status: DC
  Filled 2023-02-01: qty 1

## 2023-02-01 MED ORDER — DROPERIDOL 2.5 MG/ML IJ SOLN
1.2500 mg | Freq: Once | INTRAMUSCULAR | Status: AC
  Administered 2023-02-01: 1.25 mg via INTRAVENOUS
  Filled 2023-02-01: qty 2

## 2023-02-01 MED ORDER — POTASSIUM CHLORIDE CRYS ER 20 MEQ PO TBCR
40.0000 meq | EXTENDED_RELEASE_TABLET | Freq: Once | ORAL | Status: AC
  Administered 2023-02-01: 40 meq via ORAL
  Filled 2023-02-01: qty 2

## 2023-02-01 NOTE — ED Notes (Signed)
PTAR notified.  ?

## 2023-02-01 NOTE — ED Notes (Signed)
Oral hydration and snack provided.   

## 2023-02-01 NOTE — ED Provider Notes (Signed)
[place] EMERGENCY DEPARTMENT AT [facility] Provider Note   CSN: [id number] Arrival date & time: 02/01/23  1411     History  Chief Complaint  Patient presents with   Abdominal Pain    Kelli Hudson is a 32 y.o. female.  Patient is a 32 year old female with a past medical history of diabetes, gastroparesis, hypertension, GERD, bilateral AKA, multiple previous admissions for DKA presenting to the emergency department with abdominal pain and vomiting.  Patient states that she woke up this morning with nausea and vomiting.  She states she is associated epigastric pain.  States this feels similar to her gastroparesis.  Denies any prior abdominal surgeries.  Denies any diarrhea.  Denies any dysuria or hematuria. She is requesting dilaudid that she states helps with her gastroparesis pain.  The history is provided by the patient.  Abdominal Pain      Home Medications Prior to Admission medications   Medication Sig Start Date End Date Taking? Authorizing Provider  acetaminophen (TYLENOL) 325 MG tablet Take 2 tablets (650 mg total) by mouth every 6 (six) hours as needed for mild pain, fever or headache. 11/22/22   Azucena Fallen, MD  amLODipine (NORVASC) 10 MG tablet Take 1 tablet (10 mg total) by mouth daily. 12/04/22   Elgergawy, Leana Roe, MD  ascorbic acid (VITAMIN C) 1000 MG tablet Take 1 tablet (1,000 mg total) by mouth daily. 11/22/22   Azucena Fallen, MD  carvedilol (COREG) 6.25 MG tablet Take 1 tablet (6.25 mg total) by mouth 2 (two) times daily with a meal. 12/03/22   Elgergawy, Leana Roe, MD  Continuous Glucose Sensor (DEXCOM G6 SENSOR) MISC 1 Device by Does not apply route as directed. 06/18/22   Shamleffer, Konrad Dolores, MD  Continuous Glucose Transmitter (DEXCOM G6 TRANSMITTER) MISC 1 Device by Does not apply route as directed. 06/18/22   Shamleffer, Konrad Dolores, MD  etonogestrel (NEXPLANON) 68 MG IMPL implant 1 each by Subdermal route  continuous.    [provider]  famotidine (PEPCID) 20 MG tablet Take 1 tablet (20 mg total) by mouth 2 (two) times daily. 12/03/22   Elgergawy, Leana Roe, MD  feeding supplement, GLUCERNA SHAKE, (GLUCERNA SHAKE) LIQD Take 237 mLs by mouth 3 (three) times daily between meals. 11/22/22   Azucena Fallen, MD  furosemide (LASIX) 20 MG tablet Take 1 tablet (20 mg total) by mouth daily as needed for edema or fluid. 12/03/22   Elgergawy, Leana Roe, MD  gabapentin (NEURONTIN) 300 MG capsule Take 1 capsule (300 mg total) by mouth every 8 (eight) hours. 11/22/22   Azucena Fallen, MD  HYDROmorphone (DILAUDID) 2 MG tablet Take 1 tablet (2 mg total) by mouth every 4 (four) hours as needed for severe pain (pain score 7-10) or moderate pain (pain score 4-6). 12/03/22   Elgergawy, Leana Roe, MD  insulin aspart (NOVOLOG) 100 UNIT/ML injection Inject 0-9 Units into the skin 3 (three) times daily with meals. 12/03/22   Elgergawy, Leana Roe, MD  insulin detemir (LEVEMIR FLEXTOUCH) 100 UNIT/ML FlexPen Inject 4 Units into the skin 2 (two) times daily. 11/22/22   Azucena Fallen, MD  Insulin Pen Needle 32G X 4 MM MISC 1 Device by Does not apply route in the morning, at noon, in the evening, and at bedtime. 06/18/22   Shamleffer, Konrad Dolores, MD  MAGNESIUM PO Take 1 tablet by mouth daily.    [provider]  methocarbamol (ROBAXIN) 500 MG tablet Take 500 mg by mouth 3 (  three) times daily as needed for muscle spasms. 11/22/22   [provider]  metoCLOPramide (REGLAN) 10 MG tablet Take 1 tablet (10 mg total) by mouth every 8 (eight) hours as needed for nausea. 01/23/23   Burnadette Pop, MD  Multiple Vitamin (MULTIVITAMIN WITH MINERALS) TABS tablet Take 1 tablet by mouth daily. 11/22/22   Azucena Fallen, MD  nutrition supplement, JUVEN, [employer]) PACK Take 1 packet by mouth 2 (two) times daily between meals. Patient not taking: Reported on 01/21/2023 11/22/22   Azucena Fallen, MD   potassium chloride SA (KLOR-CON M) 20 MEQ tablet Take 1 tablet (20 mEq total) by mouth 2 (two) times daily for 5 days. 01/23/23 01/28/23  Burnadette Pop, MD  sucralfate (CARAFATE) 1 g tablet Take 1 g by mouth in the morning, at noon, and at bedtime. 08/28/22   [provider]  traZODone (DESYREL) 150 MG tablet Take 150 mg by mouth at bedtime. 01/14/23   [provider]      Allergies    Ibuprofen and Lisinopril    Review of Systems   Review of Systems  Gastrointestinal:  Positive for abdominal pain.    Physical Exam Updated Vital Signs BP (!) 141/85   Pulse (!) 104   Temp 98.9 F (37.2 C) (Oral)   Resp 10   SpO2 95%  Physical Exam Vitals and nursing note reviewed.  Constitutional:      General: She is not in acute distress.    Appearance: She is well-developed.  HENT:     Head: Normocephalic.     Mouth/Throat:     Mouth: Mucous membranes are moist.     Pharynx: Oropharynx is clear.  Eyes:     Extraocular Movements: Extraocular movements intact.  Cardiovascular:     Rate and Rhythm: Regular rhythm. Tachycardia present.     Heart sounds: Normal heart sounds.  Pulmonary:     Effort: Pulmonary effort is normal.     Breath sounds: Normal breath sounds.  Abdominal:     General: Abdomen is flat.     Palpations: Abdomen is soft.     Tenderness: There is abdominal tenderness in the right upper quadrant, epigastric area and left upper quadrant. There is no guarding or rebound.  Skin:    General: Skin is warm and dry.  Neurological:     General: No focal deficit present.     Mental Status: She is alert and oriented to person, place, and time.  Psychiatric:        Mood and Affect: Mood normal.        Behavior: Behavior normal.     ED Results / Procedures / Treatments   Labs (all labs ordered are listed, but only abnormal results are displayed) Labs Reviewed  COMPREHENSIVE METABOLIC PANEL - Abnormal; Notable for the following components:      Result  Value   Potassium 2.9 (*)    Glucose, Bld 266 (*)    Creatinine, Ser 1.26 (*)    Calcium 8.8 (*)    Total Protein 5.8 (*)    Albumin 1.7 (*)    AST 14 (*)    Alkaline Phosphatase 140 (*)    GFR, Estimated 59 (*)    All other components within normal limits  CBC WITH DIFFERENTIAL/PLATELET - Abnormal; Notable for the following components:   Hemoglobin 11.8 (*)    Platelets 458 (*)    All other components within normal limits  LIPASE, BLOOD  PREGNANCY, URINE  CBG MONITORING,  ED    EKG EKG Interpretation Date/Time:  Sunday February 01 2023 14:35:00 EST Ventricular Rate:  115 PR Interval:  185 QRS Duration:  85 QT Interval:  291 QTC Calculation: 403 R Axis:   62  Text Interpretation: Sinus tachycardia Borderline repolarization abnormality No significant change since last tracing Confirmed by Elayne Snare ([id number]) on 02/01/2023 3:07:45 PM  Radiology No results found.  Procedures Procedures    Medications Ordered in ED Medications  famotidine (PEPCID) IVPB 20 mg premix (20 mg Intravenous New Bag/Given 02/01/23 1502)  metoCLOPramide (REGLAN) injection 10 mg (10 mg Intravenous Given 02/01/23 1457)  HYDROmorphone (DILAUDID) injection 1 mg (1 mg Intravenous Given 02/01/23 1457)  lactated ringers bolus 1,000 mL (1,000 mLs Intravenous New Bag/Given 02/01/23 1500)    ED Course/ Medical Decision Making/ A&P Clinical Course as of 02/01/23 1528  Sun Feb 01, 2023  1516 Patient signed out to Dr. Posey Rea pending labs and reassessment. [VK]    Clinical Course User Index [VK] Rexford Maus, DO                                 Medical Decision Making This patient presents to the ED with chief complaint(s) of abdominal pain, nausea/vomiting with pertinent past medical history of diabetes, gastroparesis, hypertension, recent admission for DKA which further complicates the presenting complaint. The complaint involves an extensive differential diagnosis and also carries with it  a high risk of complications and morbidity.    The differential diagnosis includes gastroparesis, DKA, hyperglycemic crisis, dehydration, electrolyte abnormality, pregnancy, pancreatitis, hepatitis, cholelithiasis or cholecystitis less likely as negative gallbladder imaging in October of this year  Additional history obtained: Additional history obtained from N/A Records reviewed previous admission documents  ED Course and Reassessment: On patient's arrival, she is tachycardic but otherwise hemodynamically stable in no acute distress.  Patient does have diffuse upper abdominal pain to palpation, reportedly similar to her prior gastroparesis.  Patient will have Accu-Chek and labs including LFTs and lipase will be started on fluids and will be given pain and nausea control and will be closely reassessed.  Independent labs interpretation:  The following labs were independently interpreted: cbc within normal range, other labs pending  Independent visualization of imaging: -N/A     Amount and/or Complexity of Data Reviewed Labs: ordered.  Risk Prescription drug management.          Final Clinical Impression(s) / ED Diagnoses Final diagnoses:  None    Rx / DC Orders ED Discharge Orders     None         Rexford Maus, DO 02/01/23 1528

## 2023-02-01 NOTE — ED Triage Notes (Signed)
Pt BIB [facility] from home d/t abd pain. Per EMS' Pt has gastroparesis, stated this happens a lot. Bilateral amputee. Pt stated she usually gets dilaudid and fluids. A&O X4.

## 2023-02-01 NOTE — ED Provider Notes (Addendum)
  Physical Exam  BP 132/89   Pulse 95   Temp 98.9 F (37.2 C) (Oral)   Resp (!) 7   SpO2 100%   Physical Exam Vitals and nursing note reviewed.  Constitutional:      General: She is not in acute distress.    Appearance: She is well-developed.  HENT:     Head: Normocephalic and atraumatic.  Eyes:     Conjunctiva/sclera: Conjunctivae normal.  Cardiovascular:     Rate and Rhythm: Normal rate and regular rhythm.     Heart sounds: No murmur heard. Pulmonary:     Effort: Pulmonary effort is normal. No respiratory distress.     Breath sounds: Normal breath sounds.  Abdominal:     Palpations: Abdomen is soft.     Tenderness: There is generalized abdominal tenderness.  Musculoskeletal:        General: No swelling.     Cervical back: Neck supple.  Skin:    General: Skin is warm and dry.     Capillary Refill: Capillary refill takes less than 2 seconds.  Neurological:     Mental Status: She is alert.  Psychiatric:        Mood and Affect: Mood normal.     Procedures  Procedures  ED Course / MDM   Clinical Course as of 02/01/23 2108  Sun Feb 01, 2023  1516 Patient signed out to Dr. Posey Rea pending labs and reassessment. [VK]    Clinical Course User Index [VK] Rexford Maus, DO   Medical Decision Making Amount and/or Complexity of Data Reviewed Labs: ordered.  Risk OTC drugs. Prescription drug management.   Patient received in handoff.  Known history of gastroparesis with abdominal pain today.  Pending laboratory evaluation at time of signout.  Patient with hypokalemia which was repleted here in the ER.  Patient repeatedly asking for Dilaudid stating that this is the only thing that works for her.  She has extensive documentation advising against the use of opiates for her current condition and thus we proceeded with droperidol.  Symptoms improved and patient was able to tolerate p.o. without difficulty.  On reevaluation abdominal exam significant improved and I  have low suspicion for acute intra-abdominal pathology and thus CT imaging deferred.  Patient then discharged with outpatient follow-up.       Glendora Score, MD 02/01/23 2111    Glendora Score, MD 02/01/23 2112

## 2023-02-09 ENCOUNTER — Ambulatory Visit: Payer: Medicaid Other | Admitting: Internal Medicine

## 2023-02-09 NOTE — Progress Notes (Deleted)
Name: Kelli Hudson  MRN/ DOB: [id number], [birth year]   Age/ Sex: 32 y.o., female    PCP: Cristino Martes, NP   Reason for Endocrinology Evaluation: Type 1 Diabetes Mellitus     Date of Initial Endocrinology Visit: 12/18/2020    PATIENT IDENTIFIER: [name] is a 32 y.o. female with a past medical history of T1DM, and gastroparesis. The patient presented for initial endocrinology clinic visit on 12/18/2020 for consultative assistance with her diabetes management.     HPI: Kelli Hudson was   Diagnosed with DM 2008 ( age 53) .Hemoglobin A1c has ranged from 9.7% in 2017, peaking at 13.9% in 2021.  Started insulin pump 01/2021  She was lost to follow-up from 04/2021 until her return over a year later 06/2022, by then she was of OmniPod and did not want to go back on it   THYROID HISTORY: Patient has been noted with thyromegaly during physical exam, which prompted a thyroid ultrasound on 03/15/2020 revealing multinodular goiter.  Two of the nodules met criteria for FNA.  Patient was lost to follow-up.  Repeat FNA has been ordered 03/2021, GSR required repeat ultrasound first as it has been over a year since the prior ultrasound, repeat ultrasound 04/2021 continue to show multinodular goiter but none met FNA criteria at the time  SUBJECTIVE:   During the last visit (06/18/2022): A1c 8.8%   Today (02/09/23): Kelli Hudson is here for a follow up on diabetes management. She has NOT been to our clinic in 7 months.    She has been checking glucose 3-4 x daily .     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  S/P B/L AKA Last eye exam: Completed 09/2020  Macrovascular complications:   Denies: CAD, PVD, CVA   PAST HISTORY: Past Medical History:  Past Medical History:  Diagnosis Date   Acute H. pylori gastric ulcer    Coffee ground emesis    Diabetes mellitus (HCC)    Diabetic gastroparesis (HCC)    DKA (diabetic ketoacidosis) (HCC) 02/24/2021   Gastroparesis    GERD (gastroesophageal reflux disease)    Hypertension    Hyperthyroidism    Intractable nausea and vomiting 04/20/2021   Normocytic anemia 04/20/2020   Prolonged Q-T interval on ECG    Past Surgical History:  Past Surgical History:  Procedure Laterality Date   AMPUTATION Bilateral 10/24/2022   Procedure: BILATERAL BELOW KNEE AMPUTATION;  Surgeon: Nadara Mustard, MD;  Location: [facility] OR;  Service: Orthopedics;  Laterality: Bilateral;   AMPUTATION Bilateral 10/08/2022   Procedure: BILATERAL LEG DEBRIDEMENT;  Surgeon: Nadara Mustard, MD;  Location: [facility] OR;  Service: Orthopedics;  Laterality: Bilateral;   AMPUTATION Bilateral 11/14/2022   Procedure: BILATERAL ABOVE KNEE AMPUTATION;  Surgeon: Nadara Mustard, MD;  Location: [facility] OR;  Service: Orthopedics;  Laterality: Bilateral;   AMPUTATION TOE Left 03/10/2018   Procedure: AMPUTATION FIFTH TOE;  Surgeon: Vivi Barrack, DPM;  Location: [facility];  Service: Podiatry;  Laterality: Left;   APPLICATION OF WOUND VAC Bilateral 10/12/2022   Procedure: APPLICATION OF WOUND VAC BILATERAL LEGS;  Surgeon: Myrene Galas, MD;  Location: [facility] OR;  Service: Orthopedics;  Laterality: Bilateral;   APPLICATION OF WOUND VAC Bilateral 10/24/2022   Procedure: APPLICATION OF WOUND VAC;  Surgeon: Nadara Mustard, MD;  Location: [facility] OR;  Service: Orthopedics;  Laterality: Bilateral;   BIOPSY  01/28/2020   Procedure: BIOPSY;  Surgeon: Kathi Der, MD;  Location: [facility];  Service: Gastroenterology;;   BOTOX INJECTION  08/26/2021    Procedure: BOTOX INJECTION;  Surgeon: Sherrilyn Rist, MD;  Location: [facility];  Service: Gastroenterology;;   ESOPHAGOGASTRODUODENOSCOPY N/A 01/28/2020   Procedure: ESOPHAGOGASTRODUODENOSCOPY (EGD);  Surgeon: Kathi Der, MD;  Location: [facility];  Service: Gastroenterology;  Laterality: N/A;   ESOPHAGOGASTRODUODENOSCOPY (EGD) WITH PROPOFOL Left 09/08/2015   Procedure: ESOPHAGOGASTRODUODENOSCOPY (EGD) WITH PROPOFOL;  Surgeon: Willis Modena, MD;  Location: [facility] ENDOSCOPY;  Service: Endoscopy;  Laterality: Left;   ESOPHAGOGASTRODUODENOSCOPY (EGD) WITH PROPOFOL N/A 08/26/2021   Procedure: ESOPHAGOGASTRODUODENOSCOPY (EGD) WITH PROPOFOL;  Surgeon: Sherrilyn Rist, MD;  Location: [facility];  Service: Gastroenterology;  Laterality: N/A;   GASTRIC STIMULATOR IMPLANT SURGERY  06/2022   at [facility]   I & D EXTREMITY Bilateral 10/12/2022   Procedure: IRRIGATION AND  DEBRIDEMENT  BILATERAL LEGS;  Surgeon: Myrene Galas, MD;  Location: [facility] OR;  Service: Orthopedics;  Laterality: Bilateral;   I & D EXTREMITY Right 10/13/2022   Procedure: RIGHT THIGH WOUND VAC EXCHANGE;  Surgeon: Roby Lofts, MD;  Location: [facility] OR;  Service: Orthopedics;  Laterality: Right;   I & D EXTREMITY Bilateral 10/17/2022   Procedure: BILATERAL THIGH AND LEG DEBRIDEMENT, PARTIAL BILATERAL FOOT AMPUTATION;  Surgeon: Nadara Mustard, MD;  Location: [facility] OR;  Service: Orthopedics;  Laterality: Bilateral;   I & D EXTREMITY Bilateral 11/05/2022   Procedure: BILATERAL THIGH DEBRIDEMENT;  Surgeon: Nadara Mustard, MD;  Location: [facility] OR;  Service: Orthopedics;  Laterality: Bilateral;   I & D EXTREMITY Bilateral 11/07/2022   Procedure: BILATERAL THIGH AND LEG DEBRIDEMENT;  Surgeon: Nadara Mustard, MD;  Location: [facility] OR;  Service: Orthopedics;  Laterality: Bilateral;   I & D EXTREMITY Bilateral 11/12/2022   Procedure: BILATERAL LEG DEBRIDEMENTS;  Surgeon: Nadara Mustard, MD;  Location: [facility] OR;  Service: Orthopedics;  Laterality: Bilateral;    IRRIGATION AND DEBRIDEMENT FOOT Bilateral 10/05/2022   Procedure: IRRIGATION AND DEBRIDEMENT FOOT;  Surgeon: [name], DPM;  Location: [facility];  Service: Orthopedics/Podiatry;  Laterality: Bilateral;   WISDOM TOOTH EXTRACTION      Social History:  reports that she has never smoked. She has never used smokeless tobacco. She reports current alcohol use of about 1.0 standard drink of alcohol per week. She reports that she does not use drugs. Family History:  Family History  Problem Relation Age of Onset   Lung cancer Mother    Diabetes Mother    Bipolar disorder Father    Liver cancer Maternal Grandfather    Diabetes Maternal Aunt        x 2   Pancreatic cancer Maternal Uncle    Prostate cancer Maternal Uncle    Breast cancer Other        maternal great aunt   Heart disease Other    Ovarian cancer Other        maternal great aunt   Kidney disease Other        maternal great aunt   Colon cancer Neg Hx    Stomach cancer Neg Hx      HOME MEDICATIONS: Allergies as of  02/09/2023       Reactions   Ibuprofen Other (See Comments)   07/23/22 - Worsening kidney function after 3 doses of ibuprofen 400mg .  Creatinine increased from 1.3 to 2.  Creatinine returned to normal after stopping.   Lisinopril Cough, Other (See Comments), Itching, Rash   Breakout in rash        Medication List        Accurate as of February 09, 2023  7:28 AM. If you have any questions, ask your nurse or doctor.          acetaminophen 325 MG tablet Commonly known as: TYLENOL Take 2 tablets (650 mg total) by mouth every 6 (six) hours as needed for mild pain, fever or headache.   amLODipine 10 MG tablet Commonly known as: NORVASC Take 1 tablet (10 mg total) by mouth daily.   ascorbic acid 1000 MG tablet Commonly known as: VITAMIN C Take 1 tablet (1,000 mg total) by mouth daily.   carvedilol 6.25 MG tablet Commonly known as: COREG Take 1 tablet (6.25 mg total) by mouth 2 (two) times daily with  a meal.   Dexcom G6 Sensor Misc 1 Device by Does not apply route as directed.   Dexcom G6 Transmitter Misc 1 Device by Does not apply route as directed.   famotidine 20 MG tablet Commonly known as: PEPCID Take 1 tablet (20 mg total) by mouth 2 (two) times daily.   feeding supplement (GLUCERNA SHAKE) Liqd Take 237 mLs by mouth 3 (three) times daily between meals.   nutrition supplement (JUVEN) Pack Take 1 packet by mouth 2 (two) times daily between meals.   furosemide 20 MG tablet Commonly known as: LASIX Take 1 tablet (20 mg total) by mouth daily as needed for edema or fluid.   gabapentin 300 MG capsule Commonly known as: NEURONTIN Take 1 capsule (300 mg total) by mouth every 8 (eight) hours.   HYDROmorphone 2 MG tablet Commonly known as: DILAUDID Take 1 tablet (2 mg total) by mouth every 4 (four) hours as needed for severe pain (pain score 7-10) or moderate pain (pain score 4-6).   insulin aspart 100 UNIT/ML injection Commonly known as: novoLOG Inject 0-9 Units into the skin 3 (three) times daily with meals.   Insulin Pen Needle 32G X 4 MM Misc 1 Device by Does not apply route in the morning, at noon, in the evening, and at bedtime.   Levemir FlexTouch 100 UNIT/ML FlexTouch Pen Generic drug: insulin detemir Inject 4 Units into the skin 2 (two) times daily.   MAGNESIUM PO Take 1 tablet by mouth daily.   methocarbamol 500 MG tablet Commonly known as: ROBAXIN Take 500 mg by mouth 3 (three) times daily as needed for muscle spasms.   metoCLOPramide 10 MG tablet Commonly known as: REGLAN Take 1 tablet (10 mg total) by mouth every 8 (eight) hours as needed for nausea.   multivitamin with minerals Tabs tablet Take 1 tablet by mouth daily.   Nexplanon 68 MG Impl implant Generic drug: etonogestrel 1 each by Subdermal route continuous.   potassium chloride SA 20 MEQ tablet Commonly known as: KLOR-CON M Take 1 tablet (20 mEq total) by mouth 2 (two) times daily for 5  days.   sucralfate 1 g tablet Commonly known as: CARAFATE Take 1 g by mouth in the morning, at noon, and at bedtime.   traZODone 150 MG tablet Commonly known as: DESYREL Take 150 mg by mouth at bedtime.  ALLERGIES: Allergies  Allergen Reactions   Ibuprofen Other (See Comments)    07/23/22 - Worsening kidney function after 3 doses of ibuprofen 400mg .  Creatinine increased from 1.3 to 2.  Creatinine returned to normal after stopping.   Lisinopril Cough, Other (See Comments), Itching and Rash    Breakout in rash     REVIEW OF SYSTEMS: A comprehensive ROS was conducted with the patient and is negative except as per HPI     OBJECTIVE:   VITAL SIGNS: There were no vitals taken for this visit.   PHYSICAL EXAM:  General: Pt appears well and is in NAD  Neck: General: Supple without adenopathy or carotid bruits. Thyroid: Thyroid size normal.  Right nodule appreciated   Lungs: Clear with good BS bilat   Heart: RRR   Abdomen:  soft, nontender  Extremities:  Lower extremities - No pretibial edema.   Neuro: MS is good with appropriate affect, pt is alert and Ox3    DM Foot exam 06/18/2022 The skin of the feet is without sores or ulcerations. The pedal pulses are 2+ on right and 2+ on left. The sensation is absent to a screening 5.07, 10 gram monofilament bilaterally   DATA REVIEWED:  Lab Results  Component Value Date   HGBA1C 5.9 (H) 11/18/2022   HGBA1C 8.8 (A) 06/18/2022   HGBA1C 11.2 (H) 03/23/2022    Latest Reference Range & Units 02/01/23 14:47  Sodium 135 - 145 mmol/L 138  Potassium 3.5 - 5.1 mmol/L 2.9 (L)  Chloride 98 - 111 mmol/L 105  CO2 22 - 32 mmol/L 22  Glucose 70 - 99 mg/dL 409 (H)  BUN 6 - 20 mg/dL 17  Creatinine 8.11 - 9.14 mg/dL 7.82 (H)  Calcium 8.9 - 10.3 mg/dL 8.8 (L)  Anion gap 5 - 15  11  Magnesium 1.7 - 2.4 mg/dL 1.7  Alkaline Phosphatase 38 - 126 U/L 140 (H)  Albumin 3.5 - 5.0 g/dL 1.7 (L)  Lipase 11 - 51 U/L 22  AST 15 - 41 U/L 14  (L)  ALT 0 - 44 U/L 15  Total Protein 6.5 - 8.1 g/dL 5.8 (L)  Total Bilirubin <1.2 mg/dL 1.0  GFR, Estimated >95 mL/min 59 (L)        Thyroid ultrasound 05/15/2021  Nodule # 1:   Prior biopsy: No   Location: Right; mid   Maximum size: 1.3 cm; Other 2 dimensions: 0.9 x 0.7 cm, previously, 1.3 x 0.9 x 0.8 cm   Composition: solid/almost completely solid (2)   Echogenicity: isoechoic (1)   Shape: taller-than-wide (3)   Margins: ill-defined (0)   Echogenic foci: none (0)   ACR TI-RADS total points: 6.   ACR TI-RADS risk category:  TR4 (4-6 points).   Significant change in size (>/= 20% in two dimensions and minimal increase of 2 mm): No   Change in features: No   Change in ACR TI-RADS risk category: No   ACR TI-RADS recommendations:   *Given size (>/= 1 - 1.4 cm) and appearance, a follow-up ultrasound in 1 year should be considered based on TI-RADS criteria.   _________________________________________________________   Nodule # 2:   Prior biopsy: No   Location: Right; inferior   Maximum size: 1.3 cm; Other 2 dimensions: 1.2 x 1.1 cm, previously, 1.5 x 1.3 x 1.2 cm   Composition: solid/almost completely solid (2)   Echogenicity: hypoechoic (2)   Shape: not taller-than-wide (0)   Margins: smooth (0)   Echogenic foci: none (0)  ACR TI-RADS total points: 4.   ACR TI-RADS risk category:  TR4 (4-6 points).   Significant change in size (>/= 20% in two dimensions and minimal increase of 2 mm): No   Change in features: No   Change in ACR TI-RADS risk category: No   ACR TI-RADS recommendations:   *Given size (>/= 1 - 1.4 cm) and appearance, a follow-up ultrasound in 1 year should be considered based on TI-RADS criteria.   _________________________________________________________   Mildly enlarged left neck lymph node is seen.   IMPRESSION: 1. Nodule 1 (TI-RADS 4), measuring 1.3 cm, located in the mid right thyroid lobe, is unchanged in size  since 03/15/2020. Annual ultrasound surveillance is recommended until 5 years of stability is documented. 2. Nodule 2 (TI-RADS 4), measuring 1.3 cm, located in the inferior right thyroid lobe, is unchanged in size since 03/15/2020. Annual ultrasound surveillance is recommended until 5 years of stability is documented. 3. Mildly enlarged left submandibular neck lymph node measuring up to 10 mm in short axis. Etiology of this lymph node is uncertain but most likely relates to inflammation. A follow-up ultrasound should be performed in 8-12 weeks to again evaluate this lymph node. Ultrasound should be performed earlier if there is clinical evidence of growth.     ABI 03/13/2021 Right: Resting right ankle-brachial index is within normal range. No  evidence of significant right lower extremity arterial disease. The right  toe-brachial index is normal.   Left: Resting left ankle-brachial index is within normal range. No  evidence of significant left lower extremity arterial disease. The left  toe-brachial index is normal.     In office BG 249 mg/dL  ASSESSMENT / PLAN / RECOMMENDATIONS:   1) Type 1 Diabetes Mellitus, Poorly controlled, With retinopathic, neuropathic complications and gastroparesis  - Most recent A1c of 8.8 %. Goal A1c < 7.0 %.    -She has not been to our clinic in 14 months. She has been on MDI regimen  -  Pt would like to remain on MDI regimen, does not want the pump again  - She needs a refill on Dexcom G6, does not want to upgrade at this time  -Pre-conception counseling done, with A1c of 6.0 -6.5% goal during pregnancy with fasting goal 60-90 mg/dL   -She has been eating carbohydrates 1 meal a day, the other 2 meals she eliminates carbohydrate is much as possible -She will be given a standing dose of regular insulin with those meals and a new correction scale will be provided -She did not want to change her Levemir as she just received a new prescription, will  continue but increase the dose as below   MEDICATIONS: Increase Levemir 10 units BID Novolin - R 1 unit with a low-carb meal, 4 units with a high carb meal CF: Novolin (BG-130/40)   EDUCATION / INSTRUCTIONS: BG monitoring instructions: Patient is instructed to check her blood sugars 3 times a day, before meals . Call [facility] if: BG persistently < 70  I reviewed the Rule of 15 for the treatment of hypoglycemia in detail with the patient. Literature supplied.   2) Diabetic complications:  Eye: Does  have known diabetic retinopathy.  Neuro/ Feet: Does  have known diabetic peripheral neuropathy. Renal: Patient does not have known baseline CKD. She is  on an ACEI/ARB at present   3) Multinodular Goiter :  -Previous ultrasound from 02/2020 shows multinodular goiter and 2 of these nodules meet FNA criteria. -Repeat ultrasound in a year  later continue to show multinodular goiter but none met FNA criteria -Will proceed with repeat thyroid ultrasound at this time    F/U in 3 months    Signed electronically by: Lyndle Herrlich, MD  [facility]  [facility] [address] [place], [place] [address] Phone: [phone] FAX: [phone]   CC: Cristino Martes, NP [address] [place] [address] Phone: [phone]  Fax: [phone]    Return to Endocrinology clinic as below: Future Appointments  Date Time Provider Department Center  02/09/2023 11:50 AM Aneri Slagel, Konrad Dolores, MD [facility] None  02/17/2023 11:00 AM Westley Foots, PT [facility]

## 2023-02-17 ENCOUNTER — Telehealth: Payer: Self-pay

## 2023-02-17 ENCOUNTER — Ambulatory Visit: Payer: Medicare Other | Attending: Orthopedic Surgery

## 2023-02-17 ENCOUNTER — Other Ambulatory Visit: Payer: Self-pay | Admitting: Orthopedic Surgery

## 2023-02-17 DIAGNOSIS — Z89611 Acquired absence of right leg above knee: Secondary | ICD-10-CM | POA: Insufficient documentation

## 2023-02-17 DIAGNOSIS — M6281 Muscle weakness (generalized): Secondary | ICD-10-CM | POA: Diagnosis present

## 2023-02-17 DIAGNOSIS — R2689 Other abnormalities of gait and mobility: Secondary | ICD-10-CM | POA: Diagnosis present

## 2023-02-17 DIAGNOSIS — R293 Abnormal posture: Secondary | ICD-10-CM | POA: Diagnosis present

## 2023-02-17 DIAGNOSIS — Z89612 Acquired absence of left leg above knee: Secondary | ICD-10-CM | POA: Insufficient documentation

## 2023-02-17 NOTE — Telephone Encounter (Signed)
 Dr. Duda,  Kelli Hudson was evaluated by PT on 02/17/23.  The patient would benefit from an OT evaluation for UE strength.    If you agree, please place an order in [facility] workque in [facility] or fax the order to [phone].  Thank you, [name], PT, DPT, [facility] [address] [place], [place]  [address] Phone:  [phone] Fax:  [phone]

## 2023-02-17 NOTE — Therapy (Signed)
 OUTPATIENT PHYSICAL THERAPY NEURO EVALUATION   Patient Name: Kelli Hudson MRN: [id number] DOB:[birth year], 32 y.o., female Today's Date: 02/17/2023   PCP: Daphne Lesches, NP REFERRING PROVIDER: Jerona Sage, MD  END OF SESSION:  PT End of Session - 02/17/23 1046     Visit Number 1    Number of Visits 25    Date for PT Re-Evaluation 05/15/23    Authorization Type Medicare/Medicaid    Progress Note Due on Visit 10    PT Start Time 1048    PT Stop Time 1135    PT Time Calculation (min) 47 min    Activity Tolerance Patient tolerated treatment well    Behavior During Therapy WFL for tasks assessed/performed             Past Medical History:  Diagnosis Date   Acute H. pylori gastric ulcer    Coffee ground emesis    Diabetes mellitus (HCC)    Diabetic gastroparesis (HCC)    DKA (diabetic ketoacidosis) (HCC) 02/24/2021   Gastroparesis    GERD (gastroesophageal reflux disease)    Hypertension    Hyperthyroidism    Intractable nausea and vomiting 04/20/2021   Normocytic anemia 04/20/2020   Prolonged Q-T interval on ECG    Past Surgical History:  Procedure Laterality Date   AMPUTATION Bilateral 10/24/2022   Procedure: BILATERAL BELOW KNEE AMPUTATION;  Surgeon: Sage Jerona GAILS, MD;  Location: [facility] OR;  Service: Orthopedics;  Laterality: Bilateral;   AMPUTATION Bilateral 10/08/2022   Procedure: BILATERAL LEG DEBRIDEMENT;  Surgeon: Sage Jerona GAILS, MD;  Location: [facility] OR;  Service: Orthopedics;  Laterality: Bilateral;   AMPUTATION Bilateral 11/14/2022   Procedure: BILATERAL ABOVE KNEE AMPUTATION;  Surgeon: Sage Jerona GAILS, MD;  Location: [facility] OR;  Service: Orthopedics;  Laterality: Bilateral;   AMPUTATION TOE Left 03/10/2018   Procedure: AMPUTATION FIFTH TOE;  Surgeon: Gershon Donnice SAUNDERS, DPM;  Location: [facility];  Service: Podiatry;  Laterality: Left;   APPLICATION OF WOUND VAC Bilateral 10/12/2022   Procedure: APPLICATION OF WOUND VAC BILATERAL LEGS;  Surgeon:  Celena Sharper, MD;  Location: [facility] OR;  Service: Orthopedics;  Laterality: Bilateral;   APPLICATION OF WOUND VAC Bilateral 10/24/2022   Procedure: APPLICATION OF WOUND VAC;  Surgeon: Sage Jerona GAILS, MD;  Location: [facility] OR;  Service: Orthopedics;  Laterality: Bilateral;   BIOPSY  01/28/2020   Procedure: BIOPSY;  Surgeon: Elicia Claw, MD;  Location: [facility];  Service: Gastroenterology;;   BOTOX  INJECTION  08/26/2021   Procedure: BOTOX  INJECTION;  Surgeon: Legrand Victory LITTIE DOUGLAS, MD;  Location: [facility];  Service: Gastroenterology;;   ESOPHAGOGASTRODUODENOSCOPY N/A 01/28/2020   Procedure: ESOPHAGOGASTRODUODENOSCOPY (EGD);  Surgeon: Elicia Claw, MD;  Location: [facility];  Service: Gastroenterology;  Laterality: N/A;   ESOPHAGOGASTRODUODENOSCOPY (EGD) WITH PROPOFOL  Left 09/08/2015   Procedure: ESOPHAGOGASTRODUODENOSCOPY (EGD) WITH PROPOFOL ;  Surgeon: Elsie Cree, MD;  Location: [facility] END[facility]COPY;  Service: Endoscopy;  Laterality: Left;   ESOPHAGOGASTRODUODENOSCOPY (EGD) WITH PROPOFOL  N/A 08/26/2021   Procedure: ESOPHAGOGASTRODUODENOSCOPY (EGD) WITH PROPOFOL ;  Surgeon: Legrand Victory LITTIE DOUGLAS, MD;  Location: [facility];  Service: Gastroenterology;  Laterality: N/A;   GASTRIC STIMULATOR IMPLANT SURGERY  06/2022   at [facility]   I & D EXTREMITY Bilateral 10/12/2022   Procedure: IRRIGATION AND  DEBRIDEMENT  BILATERAL LEGS;  Surgeon: Celena Sharper, MD;  Location: [facility] OR;  Service: Orthopedics;  Laterality: Bilateral;   I & D EXTREMITY Right 10/13/2022   Procedure: RIGHT THIGH WOUND VAC EXCHANGE;  Surgeon: Kendal Franky SQUIBB, MD;  Location:  [facility] OR;  Service: Orthopedics;  Laterality: Right;   I & D EXTREMITY Bilateral 10/17/2022   Procedure: BILATERAL THIGH AND LEG DEBRIDEMENT, PARTIAL BILATERAL FOOT AMPUTATION;  Surgeon: Harden Jerona GAILS, MD;  Location: [facility] OR;  Service: Orthopedics;  Laterality: Bilateral;   I & D EXTREMITY Bilateral 11/05/2022   Procedure: BILATERAL THIGH DEBRIDEMENT;  Surgeon: Harden Jerona GAILS, MD;  Location: [facility] OR;  Service: Orthopedics;  Laterality: Bilateral;   I & D EXTREMITY Bilateral 11/07/2022   Procedure: BILATERAL THIGH AND LEG DEBRIDEMENT;  Surgeon: Harden Jerona GAILS, MD;  Location: [facility] OR;  Service: Orthopedics;  Laterality: Bilateral;   I & D EXTREMITY Bilateral 11/12/2022   Procedure: BILATERAL LEG DEBRIDEMENTS;  Surgeon: Harden Jerona GAILS, MD;  Location: [facility] OR;  Service: Orthopedics;  Laterality: Bilateral;   IRRIGATION AND DEBRIDEMENT FOOT Bilateral 10/05/2022   Procedure: IRRIGATION AND DEBRIDEMENT FOOT;  Surgeon: Janit Thresa HERO, DPM;  Location: [facility];  Service: Orthopedics/Podiatry;  Laterality: Bilateral;   WISDOM TOOTH EXTRACTION     Patient Active Problem List   Diagnosis Date Noted   Abscess of thigh 11/28/2022   C. 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   Hypokalemia 11/27/2022   Effusion, left knee 11/05/2022   MDD (major depressive disorder), recurrent episode, moderate (HCC) 10/16/2022   Necrotizing fasciitis of pelvic region and thigh (HCC) 10/08/2022   Necrotizing fasciitis of lower leg (HCC) 10/08/2022   Necrotizing fasciitis (HCC) 10/08/2022   Sepsis due to cellulitis (HCC) 10/03/2022   Cellulitis of lower extremity 10/03/2022   Multinodular goiter 06/18/2022   Malnutrition of moderate degree 04/18/2022   Intractable vomiting with nausea 04/17/2022   DKA (diabetic ketoacidosis) (HCC) 03/04/2022   Refractory nausea and vomiting 12/25/2021   Diabetic gastroparesis (HCC) 12/07/2021   Intractable nausea and vomiting 10/09/2021   Drug-seeking behavior 09/21/2021   Essential hypertension 08/25/2021   GERD without esophagitis 08/25/2021   Hematemesis    Prolonged Q-T interval on ECG    Nausea and vomiting 07/20/2021   Hypomagnesemia 04/21/2021   Ulcerated, foot, left, with fat layer exposed (HCC) 04/20/2021   Uncontrolled type 1 diabetes mellitus with hyperglycemia, with long-term current use of insulin   (HCC) 12/18/2020   DM type 1 (diabetes mellitus, type 1) (HCC) 12/18/2020   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) 11/09/2020   Diabetic retinopathy of both eyes associated with type 1 diabetes mellitus (HCC) 09/24/2020   Diabetic gastroparesis associated with type 1 diabetes mellitus (HCC) 08/12/2020   AKI (acute kidney injury) (HCC) 07/25/2020   Generalized abdominal pain 07/23/2020   Diabetic nephropathy associated with type 1 diabetes mellitus (HCC) 06/27/2020   Sepsis (HCC) 05/27/2020   HLD (hyperlipidemia) 05/23/2020   Sinus tachycardia 05/09/2020   Anemia 04/20/2020   Depression with anxiety 07/05/2019   Diabetic polyneuropathy associated with type 1 diabetes mellitus (HCC) 09/24/2017   Gastroparesis    Goiter 10/05/2009   Hypertension associated with diabetes (HCC) 10/05/2009    ONSET DATE: 01/28/2023  REFERRING DIAG: S10.388,S10.387 (ICD-10-CM) - S/P AKA (above knee amputation) bilateral (HCC)  THERAPY DIAG:  Abnormal posture  Muscle weakness (generalized)  Other abnormalities of gait and mobility  Rationale for Evaluation and Treatment: Rehabilitation  SUBJECTIVE:  SUBJECTIVE STATEMENT: Patient arrives to clinic in manual wheelchair with cousin. Reports low vision at baseline. Does not have prosthetics yet, it will be a few weeks still. Did receive exercises in the SNF and has been doing those rather consistently.  Pt accompanied by: family member  PERTINENT HISTORY: anemia, DM, GERD, HTN, thyroid dx  PAIN:  Are you having pain? No  PRECAUTIONS: Fall and Other: gastric pacer    WEIGHT BEARING RESTRICTIONS: Yes NWB B LE without prosthetics  FALLS: Has patient fallen in last 6 months? Yes. Number of falls 1; fell transferring into wc, no injuries,  mom helped her off the floor  LIVING ENVIRONMENT: Lives with: lives alone Lives in: House/apartment Stairs: No Has following equipment at home: Wheelchair (manual), shower chair, and slideboard  PLOF: Independent  PATIENT GOALS: to learn how to dance again  OBJECTIVE:  Note: Objective measures were completed at Evaluation unless otherwise noted.    COGNITION: Overall cognitive status: Within functional limits for tasks assessed   SENSATION: WFL  POSTURE: No Significant postural limitations  LOWER EXTREMITY MMT:    MMT Right Eval Left Eval  Hip flexion 4 4  Hip extension 3 3  Hip abduction 3 3  Hip adduction 3 3  Hip internal rotation    Hip external rotation    Knee flexion    Knee extension    Ankle dorsiflexion    Ankle plantarflexion    Ankle inversion    Ankle eversion    (Blank rows = not tested)  BED MOBILITY:  independent  TRANSFERS: Assistive device utilized: None  Chair to chair: Modified independence A/P transfer   FUNCTIONAL TESTS:  Started Amp Pro: 5                                                                                                                              TREATMENT  N/a eval   PATIENT EDUCATION: Education details: PT POC, continue HEP from rehab, wearing shrinker socks, use of anti-tippers, benefit of OT and custom WC Person educated: Patient and cousin Education method: Explanation Education comprehension: verbalized understanding  HOME EXERCISE PROGRAM: To be updated from rehab   GOALS: Goals reviewed with patient? Yes  SHORT TERM GOALS: Target date: 03/20/23  Pt will be independent with initial HEP for improved functional strength and balance  Baseline: to be updated Goal status: INITIAL  2.  Amp Pro goal Baseline: to be completed once prosthetics received Goal status: INITIAL  3.  5xSTS goal Baseline: to be assessed once prosthetics are received  Goal status: INITIAL  4.  TUG goal Baseline: to be  assessed once prosthetics received  Goal status: INITIAL  5.  Gait speed goal Baseline: to be assessed once prosthetics received Goal status: INITIAL   LONG TERM GOALS: Target date: 05/15/23  Pt will be independent with final HEP for improved functional strength and balance  Baseline: to be updated Goal status: INITIAL  Amp Pro goal  Baseline: to be completed once prosthetics received Goal status: INITIAL   5xSTS goal Baseline: to be assessed once prosthetics are received  Goal status: INITIAL  TUG goal Baseline: to be assessed once prosthetics received  Goal status: INITIAL  Gait speed goal Baseline: to be assessed once prosthetics received Goal status: INITIAL  ASSESSMENT:  CLINICAL IMPRESSION: Patient is a 32 y.o. female who was seen today for physical therapy evaluation and treatment for mobility impairments related to B AKA. Patient is independent with A/P and lateral transfers mat<> wc. She would greatly benefit from a custom manual wc as she spends a majority of her time in her wc. Without an adjustable rear-wheel axle, she is at a significantly increased risk for falling due to her status as a bilateral AKA. Even once she receives her prosthetics, it will be some time before she is proficient enough with them to be considered a functional ambulator. Eval was limited today as patient has not received her prosthetics yet. She would benefit from skilled PT services to address the above mentioned deficits.   OBJECTIVE IMPAIRMENTS: decreased balance, decreased endurance, decreased knowledge of use of DME, decreased mobility, difficulty walking, decreased strength, impaired UE functional use, impaired vision/preception, improper body mechanics, postural dysfunction, and pain.   ACTIVITY LIMITATIONS: carrying, lifting, stairs, transfers, bed mobility, hygiene/grooming, locomotion level, and caring for others  PARTICIPATION LIMITATIONS: meal prep, cleaning, interpersonal  relationship, driving, shopping, community activity, and occupation  PERSONAL FACTORS: Age, Time since onset of injury/illness/exacerbation, Transportation, and 3+ comorbidities: see above  are also affecting patient's functional outcome.   REHAB POTENTIAL: Good  CLINICAL DECISION MAKING: Stable/uncomplicated  EVALUATION COMPLEXITY: Low  PLAN:  PT FREQUENCY: 2x/week  PT DURATION: 12 weeks  PLANNED INTERVENTIONS: 97164- PT Re-evaluation, 97110-Therapeutic exercises, 97530- Therapeutic activity, 97112- Neuromuscular re-education, 97535- Self Care, [id number]- Manual therapy, [phone]- Gait training, [phone]- Prosthetic training, [phone]- Aquatic Therapy, Patient/Family education, Balance training, Stair training, Dry Needling, Scar mobilization, Vestibular training, Visual/preceptual remediation/compensation, DME instructions, and Wheelchair mobility training  PLAN FOR NEXT SESSION: did she receive prosthetics?, Amp Pro, TUG, 5xSTS, gait speed, incorporate dance into tx as able   [name], PT [name], PT, DPT, CBIS  02/17/2023, 12:15 PM

## 2023-02-17 NOTE — Telephone Encounter (Signed)
 Dr. Duda,  Adison is being treated by physical therapy for pre-prosthetic/prosthetic training.  They will benefit from a custom manual wheelchair in order to improve safety and independence with functional mobility.    Please submit order in epic or fax to [phone].   Thank you, [name], PT, DPT, CBIS

## 2023-02-21 ENCOUNTER — Encounter (HOSPITAL_COMMUNITY): Payer: Self-pay

## 2023-02-21 ENCOUNTER — Observation Stay (HOSPITAL_COMMUNITY): Payer: 59

## 2023-02-21 ENCOUNTER — Inpatient Hospital Stay (HOSPITAL_COMMUNITY)
Admission: EM | Admit: 2023-02-21 | Discharge: 2023-02-23 | DRG: 074 | Disposition: A | Discharge status: Home / Self Care | Payer: 59 | Attending: Family Medicine | Admitting: Family Medicine

## 2023-02-21 ENCOUNTER — Other Ambulatory Visit: Payer: Self-pay

## 2023-02-21 DIAGNOSIS — R112 Nausea with vomiting, unspecified: Secondary | ICD-10-CM | POA: Diagnosis not present

## 2023-02-21 DIAGNOSIS — I129 Hypertensive chronic kidney disease with stage 1 through stage 4 chronic kidney disease, or unspecified chronic kidney disease: Secondary | ICD-10-CM | POA: Diagnosis present

## 2023-02-21 DIAGNOSIS — Z833 Family history of diabetes mellitus: Secondary | ICD-10-CM

## 2023-02-21 DIAGNOSIS — K219 Gastro-esophageal reflux disease without esophagitis: Secondary | ICD-10-CM | POA: Diagnosis present

## 2023-02-21 DIAGNOSIS — Z818 Family history of other mental and behavioral disorders: Secondary | ICD-10-CM

## 2023-02-21 DIAGNOSIS — E860 Dehydration: Secondary | ICD-10-CM | POA: Diagnosis present

## 2023-02-21 DIAGNOSIS — K3184 Gastroparesis: Secondary | ICD-10-CM | POA: Diagnosis present

## 2023-02-21 DIAGNOSIS — N1832 Chronic kidney disease, stage 3b: Secondary | ICD-10-CM | POA: Diagnosis present

## 2023-02-21 DIAGNOSIS — Z89611 Acquired absence of right leg above knee: Secondary | ICD-10-CM

## 2023-02-21 DIAGNOSIS — N1831 Chronic kidney disease, stage 3a: Secondary | ICD-10-CM | POA: Diagnosis present

## 2023-02-21 DIAGNOSIS — Z8041 Family history of malignant neoplasm of ovary: Secondary | ICD-10-CM

## 2023-02-21 DIAGNOSIS — E1043 Type 1 diabetes mellitus with diabetic autonomic (poly)neuropathy: Secondary | ICD-10-CM | POA: Diagnosis not present

## 2023-02-21 DIAGNOSIS — E440 Moderate protein-calorie malnutrition: Secondary | ICD-10-CM | POA: Diagnosis present

## 2023-02-21 DIAGNOSIS — E1022 Type 1 diabetes mellitus with diabetic chronic kidney disease: Secondary | ICD-10-CM | POA: Diagnosis present

## 2023-02-21 DIAGNOSIS — I160 Hypertensive urgency: Secondary | ICD-10-CM | POA: Diagnosis present

## 2023-02-21 DIAGNOSIS — D75839 Thrombocytosis, unspecified: Secondary | ICD-10-CM | POA: Diagnosis present

## 2023-02-21 DIAGNOSIS — E1065 Type 1 diabetes mellitus with hyperglycemia: Secondary | ICD-10-CM | POA: Diagnosis present

## 2023-02-21 DIAGNOSIS — Z794 Long term (current) use of insulin: Secondary | ICD-10-CM

## 2023-02-21 DIAGNOSIS — Z803 Family history of malignant neoplasm of breast: Secondary | ICD-10-CM

## 2023-02-21 DIAGNOSIS — E1143 Type 2 diabetes mellitus with diabetic autonomic (poly)neuropathy: Secondary | ICD-10-CM | POA: Diagnosis not present

## 2023-02-21 DIAGNOSIS — Z801 Family history of malignant neoplasm of trachea, bronchus and lung: Secondary | ICD-10-CM

## 2023-02-21 DIAGNOSIS — Z800 Family history of malignant neoplasm of digestive organs: Secondary | ICD-10-CM

## 2023-02-21 DIAGNOSIS — Z6821 Body mass index (BMI) 21.0-21.9, adult: Secondary | ICD-10-CM

## 2023-02-21 DIAGNOSIS — N179 Acute kidney failure, unspecified: Secondary | ICD-10-CM | POA: Diagnosis present

## 2023-02-21 DIAGNOSIS — Z888 Allergy status to other drugs, medicaments and biological substances status: Secondary | ICD-10-CM

## 2023-02-21 DIAGNOSIS — Z89612 Acquired absence of left leg above knee: Secondary | ICD-10-CM

## 2023-02-21 DIAGNOSIS — E785 Hyperlipidemia, unspecified: Secondary | ICD-10-CM | POA: Diagnosis present

## 2023-02-21 DIAGNOSIS — Z886 Allergy status to analgesic agent status: Secondary | ICD-10-CM

## 2023-02-21 DIAGNOSIS — Z765 Malingerer [conscious simulation]: Secondary | ICD-10-CM

## 2023-02-21 DIAGNOSIS — G8929 Other chronic pain: Secondary | ICD-10-CM | POA: Diagnosis present

## 2023-02-21 DIAGNOSIS — E1040 Type 1 diabetes mellitus with diabetic neuropathy, unspecified: Secondary | ICD-10-CM | POA: Diagnosis present

## 2023-02-21 DIAGNOSIS — Z79899 Other long term (current) drug therapy: Secondary | ICD-10-CM

## 2023-02-21 DIAGNOSIS — E108 Type 1 diabetes mellitus with unspecified complications: Secondary | ICD-10-CM | POA: Diagnosis present

## 2023-02-21 LAB — COMPREHENSIVE METABOLIC PANEL
ALT: 33 U/L (ref 0–44)
AST: 27 U/L (ref 15–41)
Albumin: 1.8 g/dL — ABNORMAL LOW (ref 3.5–5.0)
Alkaline Phosphatase: 161 U/L — ABNORMAL HIGH (ref 38–126)
Anion gap: 13 (ref 5–15)
BUN: 24 mg/dL — ABNORMAL HIGH (ref 6–20)
CO2: 20 mmol/L — ABNORMAL LOW (ref 22–32)
Calcium: 9.1 mg/dL (ref 8.9–10.3)
Chloride: 100 mmol/L (ref 98–111)
Creatinine, Ser: 1.56 mg/dL — ABNORMAL HIGH (ref 0.44–1.00)
GFR, Estimated: 45 mL/min — ABNORMAL LOW (ref >60)
Glucose, Bld: 481 mg/dL — ABNORMAL HIGH (ref 70–99)
Potassium: 3.9 mmol/L (ref 3.5–5.1)
Sodium: 133 mmol/L — ABNORMAL LOW (ref 135–145)
Total Bilirubin: 0.8 mg/dL (ref 0.0–1.2)
Total Protein: 6.2 g/dL — ABNORMAL LOW (ref 6.5–8.1)

## 2023-02-21 LAB — CBC WITH DIFFERENTIAL/PLATELET
Abs Immature Granulocytes: 0.04 10*3/uL (ref 0.00–0.07)
Basophils Absolute: 0.1 10*3/uL (ref 0.0–0.1)
Basophils Relative: 1 %
Eosinophils Absolute: 0.1 10*3/uL (ref 0.0–0.5)
Eosinophils Relative: 1 %
HCT: 44.6 % (ref 36.0–46.0)
Hemoglobin: 13.4 g/dL (ref 12.0–15.0)
Immature Granulocytes: 0 %
Lymphocytes Relative: 18 %
Lymphs Abs: 1.6 10*3/uL (ref 0.7–4.0)
MCH: 27.9 pg (ref 26.0–34.0)
MCHC: 30 g/dL (ref 30.0–36.0)
MCV: 92.7 fL (ref 80.0–100.0)
Monocytes Absolute: 0.5 10*3/uL (ref 0.1–1.0)
Monocytes Relative: 5 %
Neutro Abs: 6.8 10*3/uL (ref 1.7–7.7)
Neutrophils Relative %: 75 %
Platelets: 483 10*3/uL — ABNORMAL HIGH (ref 150–400)
RBC: 4.81 MIL/uL (ref 3.87–5.11)
RDW: 14.4 % (ref 11.5–15.5)
WBC: 9 10*3/uL (ref 4.0–10.5)
nRBC: 0 % (ref 0.0–0.2)

## 2023-02-21 LAB — LIPASE, BLOOD: Lipase: 22 U/L (ref 11–51)

## 2023-02-21 LAB — HCG, SERUM, QUALITATIVE: Preg, Serum: NEGATIVE

## 2023-02-21 LAB — CBG MONITORING, ED
Glucose-Capillary: 454 mg/dL — ABNORMAL HIGH (ref 70–99)
Glucose-Capillary: 385 mg/dL — ABNORMAL HIGH (ref 70–99)
Glucose-Capillary: 351 mg/dL — ABNORMAL HIGH (ref 70–99)
Glucose-Capillary: 350 mg/dL — ABNORMAL HIGH (ref 70–99)

## 2023-02-21 LAB — GLUCOSE, CAPILLARY
Glucose-Capillary: 380 mg/dL — ABNORMAL HIGH (ref 70–99)
Glucose-Capillary: 298 mg/dL — ABNORMAL HIGH (ref 70–99)
Glucose-Capillary: 177 mg/dL — ABNORMAL HIGH (ref 70–99)

## 2023-02-21 MED ORDER — DROPERIDOL 2.5 MG/ML IJ SOLN
2.5000 mg | Freq: Once | INTRAMUSCULAR | Status: AC
  Administered 2023-02-21: 2.5 mg via INTRAVENOUS
  Filled 2023-02-21: qty 2

## 2023-02-21 MED ORDER — INSULIN ASPART 100 UNIT/ML IJ SOLN
6.0000 [IU] | Freq: Once | INTRAMUSCULAR | Status: AC
  Administered 2023-02-21: 6 [IU] via INTRAVENOUS

## 2023-02-21 MED ORDER — SODIUM CHLORIDE 0.9 % IV BOLUS
1000.0000 mL | Freq: Once | INTRAVENOUS | Status: AC
  Administered 2023-02-21: 1000 mL via INTRAVENOUS

## 2023-02-21 MED ORDER — INSULIN ASPART 100 UNIT/ML IJ SOLN
0.0000 [IU] | Freq: Every day | INTRAMUSCULAR | Status: DC
  Administered 2023-02-21: 3 [IU] via SUBCUTANEOUS

## 2023-02-21 MED ORDER — ONDANSETRON HCL 4 MG/2ML IJ SOLN
4.0000 mg | Freq: Once | INTRAMUSCULAR | Status: AC
  Administered 2023-02-21: 4 mg via INTRAVENOUS
  Filled 2023-02-21: qty 2

## 2023-02-21 MED ORDER — ONDANSETRON HCL 4 MG/2ML IJ SOLN
4.0000 mg | Freq: Four times a day (QID) | INTRAMUSCULAR | Status: DC | PRN
  Administered 2023-02-22: 4 mg via INTRAVENOUS
  Filled 2023-02-21 (×2): qty 2

## 2023-02-21 MED ORDER — HYDRALAZINE HCL 20 MG/ML IJ SOLN
10.0000 mg | INTRAMUSCULAR | Status: DC | PRN
  Administered 2023-02-21: 10 mg via INTRAVENOUS
  Filled 2023-02-21: qty 1

## 2023-02-21 MED ORDER — METOCLOPRAMIDE HCL 5 MG/ML IJ SOLN
10.0000 mg | Freq: Three times a day (TID) | INTRAMUSCULAR | Status: DC
  Administered 2023-02-21 – 2023-02-23 (×7): 10 mg via INTRAVENOUS
  Filled 2023-02-21 (×7): qty 2

## 2023-02-21 MED ORDER — SODIUM CHLORIDE 0.9 % IV SOLN: INTRAVENOUS | Status: DC

## 2023-02-21 MED ORDER — INSULIN ASPART 100 UNIT/ML IJ SOLN
8.0000 [IU] | Freq: Once | INTRAMUSCULAR | Status: AC
Start: 2023-02-21 — End: 2023-02-21
  Administered 2023-02-21: 8 [IU] via INTRAVENOUS

## 2023-02-21 MED ORDER — HYDROMORPHONE HCL 1 MG/ML IJ SOLN
0.5000 mg | Freq: Once | INTRAMUSCULAR | Status: AC
  Administered 2023-02-21: 0.5 mg via INTRAVENOUS
  Filled 2023-02-21: qty 1

## 2023-02-21 MED ORDER — ACETAMINOPHEN 325 MG PO TABS
650.0000 mg | ORAL_TABLET | Freq: Four times a day (QID) | ORAL | Status: DC | PRN
  Administered 2023-02-21 – 2023-02-22 (×2): 650 mg via ORAL
  Filled 2023-02-21 (×2): qty 2

## 2023-02-21 MED ORDER — CARVEDILOL 6.25 MG PO TABS
6.2500 mg | ORAL_TABLET | Freq: Two times a day (BID) | ORAL | Status: DC
  Administered 2023-02-21 – 2023-02-23 (×4): 6.25 mg via ORAL
  Filled 2023-02-21 (×4): qty 1

## 2023-02-21 MED ORDER — INSULIN GLARGINE-YFGN 100 UNIT/ML ~~LOC~~ SOLN
10.0000 [IU] | Freq: Two times a day (BID) | SUBCUTANEOUS | Status: DC
  Administered 2023-02-21 – 2023-02-23 (×3): 10 [IU] via SUBCUTANEOUS
  Filled 2023-02-21 (×5): qty 0.1

## 2023-02-21 MED ORDER — ENOXAPARIN SODIUM 40 MG/0.4ML IJ SOSY
40.0000 mg | PREFILLED_SYRINGE | INTRAMUSCULAR | Status: DC
  Administered 2023-02-21: 40 mg via SUBCUTANEOUS
  Filled 2023-02-21 (×2): qty 0.4

## 2023-02-21 MED ORDER — OXYCODONE HCL 5 MG PO TABS
5.0000 mg | ORAL_TABLET | Freq: Four times a day (QID) | ORAL | Status: DC | PRN
  Administered 2023-02-22: 5 mg via ORAL
  Filled 2023-02-21 (×2): qty 1

## 2023-02-21 MED ORDER — PROCHLORPERAZINE EDISYLATE 10 MG/2ML IJ SOLN
10.0000 mg | Freq: Four times a day (QID) | INTRAMUSCULAR | Status: DC | PRN
  Administered 2023-02-21 – 2023-02-22 (×3): 10 mg via INTRAVENOUS
  Filled 2023-02-21 (×3): qty 2

## 2023-02-21 MED ORDER — GABAPENTIN 300 MG PO CAPS
300.0000 mg | ORAL_CAPSULE | Freq: Three times a day (TID) | ORAL | Status: DC
  Administered 2023-02-21 – 2023-02-23 (×7): 300 mg via ORAL
  Filled 2023-02-21 (×7): qty 1

## 2023-02-21 MED ORDER — SODIUM CHLORIDE 0.9% FLUSH
3.0000 mL | Freq: Two times a day (BID) | INTRAVENOUS | Status: DC
  Administered 2023-02-21 – 2023-02-23 (×5): 3 mL via INTRAVENOUS

## 2023-02-21 MED ORDER — MORPHINE SULFATE (PF) 2 MG/ML IV SOLN
2.0000 mg | INTRAVENOUS | Status: DC | PRN
  Administered 2023-02-21 (×2): 2 mg via INTRAVENOUS
  Filled 2023-02-21 (×2): qty 1

## 2023-02-21 MED ORDER — GLUCERNA SHAKE PO LIQD
237.0000 mL | Freq: Three times a day (TID) | ORAL | Status: DC
  Administered 2023-02-21 – 2023-02-23 (×6): 237 mL via ORAL

## 2023-02-21 MED ORDER — ACETAMINOPHEN 650 MG RE SUPP: 650.0000 mg | Freq: Four times a day (QID) | RECTAL | Status: DC | PRN

## 2023-02-21 MED ORDER — INSULIN ASPART 100 UNIT/ML IJ SOLN
0.0000 [IU] | Freq: Three times a day (TID) | INTRAMUSCULAR | Status: DC
  Administered 2023-02-21: 15 [IU] via SUBCUTANEOUS
  Administered 2023-02-22: 5 [IU] via SUBCUTANEOUS
  Administered 2023-02-22: 3 [IU] via SUBCUTANEOUS
  Administered 2023-02-22: 2 [IU] via SUBCUTANEOUS
  Administered 2023-02-23: 5 [IU] via SUBCUTANEOUS

## 2023-02-21 MED ORDER — ACETAMINOPHEN 325 MG PO TABS
650.0000 mg | ORAL_TABLET | Freq: Once | ORAL | Status: AC
  Administered 2023-02-21: 650 mg via ORAL
  Filled 2023-02-21: qty 2

## 2023-02-21 MED ORDER — AMLODIPINE BESYLATE 10 MG PO TABS
10.0000 mg | ORAL_TABLET | Freq: Every day | ORAL | Status: DC
  Administered 2023-02-21 – 2023-02-23 (×3): 10 mg via ORAL
  Filled 2023-02-21 (×3): qty 1

## 2023-02-21 MED ORDER — ALBUTEROL SULFATE (2.5 MG/3ML) 0.083% IN NEBU: 2.5000 mg | INHALATION_SOLUTION | Freq: Four times a day (QID) | RESPIRATORY_TRACT | Status: DC | PRN

## 2023-02-21 NOTE — Progress Notes (Signed)
 Patient is refusing cardiac monitoring. This nurse explained the importance of her telemetry due to her tachycardia while with using health literacy. Patient still refused.

## 2023-02-21 NOTE — ED Notes (Signed)
 Pt refused to have cardiac monitor hooked up to her.

## 2023-02-21 NOTE — ED Triage Notes (Signed)
 PT BIB [facility] from home c/o abdominal pain and N/V. Pt has a hx of gastroparesis and that this happens often. Pt is a diabetic and CBG with EMS was 549. Pt states Zofran does not work for her so EMS did not give anything.

## 2023-02-21 NOTE — H&P (Signed)
 History and Physical    Patient: Kelli Hudson FMW:[id number] DOB: 1990/05/20 DOA: 02/21/2023 DOS: the patient was seen and examined on 02/21/2023 PCP: Arloa Jarvis, NP  Patient coming from: Home via EMS  Chief Complaint:  Chief Complaint  Patient presents with   Abdominal Pain   HPI: Kelli Hudson is a 33 y.o. female with medical history significant of control diabetes type 2, gastroparesis, DKA with numerous admissions, hypertension, hyperlipidemia, GERD, peripheral neuropathy, bilateral lower extremity AKA nausea and vomiting that started at 3 AM on the day of admission. She reported severe pain, for which she usually seeks hospital care to receive Dilaudid  and fluids. The patient denied taking any pain medications at home. She has a history of diabetes, with blood sugars reported as normal at home. Her insulin  regimen includes 10 units twice daily and a sliding scale for fast-acting insulin , ranging from zero to fifteen units at max. She also takes Levemir .  The patient reported her last bowel movement was on the day of admission, with no discomfort during urination or increased urinary frequency. She is on three different medications for blood pressure management, including Coreg  (or carvedilol ), amlodipine , and furosemide , which she does not take daily. The patient did not indicate any need to update family members about her condition.  Upon admission into the emergency department patient currently afebrile with heart rates elevated into the 130s, blood pressures elevated up to 173/133, and O2 saturation maintained on room air.  Labs significant for WBC 9, platelets 483, sodium 133, CO2 20, BUN 24, creatinine 1.56, and albumin  1.8.  Patient had been given   2 L of normal saline IV fluids, antiemetics, Cathaleen Korol 650 mg p.o., and total 30 units of insulin .    Review of Systems: As mentioned in the history of present illness. All other systems reviewed and are negative. Past  Medical History:  Diagnosis Date   Acute H. pylori gastric ulcer    Coffee ground emesis    Diabetes mellitus (HCC)    Diabetic gastroparesis (HCC)    DKA (diabetic ketoacidosis) (HCC) 02/24/2021   Gastroparesis    GERD (gastroesophageal reflux disease)    Hypertension    Hyperthyroidism    Intractable nausea and vomiting 04/20/2021   Normocytic anemia 04/20/2020   Prolonged Q-T interval on ECG    Past Surgical History:  Procedure Laterality Date   AMPUTATION Bilateral 10/24/2022   Procedure: BILATERAL BELOW KNEE AMPUTATION;  Surgeon: Harden Jerona GAILS, MD;  Location: [facility] OR;  Service: Orthopedics;  Laterality: Bilateral;   AMPUTATION Bilateral 10/08/2022   Procedure: BILATERAL LEG DEBRIDEMENT;  Surgeon: Harden Jerona GAILS, MD;  Location: [facility] OR;  Service: Orthopedics;  Laterality: Bilateral;   AMPUTATION Bilateral 11/14/2022   Procedure: BILATERAL ABOVE KNEE AMPUTATION;  Surgeon: Harden Jerona GAILS, MD;  Location: [facility] OR;  Service: Orthopedics;  Laterality: Bilateral;   AMPUTATION TOE Left 03/10/2018   Procedure: AMPUTATION FIFTH TOE;  Surgeon: Gershon Donnice SAUNDERS, DPM;  Location: [facility];  Service: Podiatry;  Laterality: Left;   APPLICATION OF WOUND VAC Bilateral 10/12/2022   Procedure: APPLICATION OF WOUND VAC BILATERAL LEGS;  Surgeon: Celena Sharper, MD;  Location: [facility] OR;  Service: Orthopedics;  Laterality: Bilateral;   APPLICATION OF WOUND VAC Bilateral 10/24/2022   Procedure: APPLICATION OF WOUND VAC;  Surgeon: Harden Jerona GAILS, MD;  Location: [facility] OR;  Service: Orthopedics;  Laterality: Bilateral;   BIOPSY  01/28/2020   Procedure: BIOPSY;  Surgeon: Elicia Claw, MD;  Location: [facility];  Service: Gastroenterology;;  BOTOX  INJECTION  08/26/2021   Procedure: BOTOX  INJECTION;  Surgeon: Legrand Victory LITTIE DOUGLAS, MD;  Location: [facility];  Service: Gastroenterology;;   ESOPHAGOGASTRODUODENOSCOPY N/A 01/28/2020   Procedure: ESOPHAGOGASTRODUODENOSCOPY (EGD);  Surgeon: Elicia Claw, MD;  Location: [facility];  Service: Gastroenterology;  Laterality: N/A;   ESOPHAGOGASTRODUODENOSCOPY (EGD) WITH PROPOFOL  Left 09/08/2015   Procedure: ESOPHAGOGASTRODUODENOSCOPY (EGD) WITH PROPOFOL ;  Surgeon: Elsie Cree, MD;  Location: [facility] END[facility]COPY;  Service: Endoscopy;  Laterality: Left;   ESOPHAGOGASTRODUODENOSCOPY (EGD) WITH PROPOFOL  N/A 08/26/2021   Procedure: ESOPHAGOGASTRODUODENOSCOPY (EGD) WITH PROPOFOL ;  Surgeon: Legrand Victory LITTIE DOUGLAS, MD;  Location: [facility];  Service: Gastroenterology;  Laterality: N/A;   GASTRIC STIMULATOR IMPLANT SURGERY  06/2022   at [facility]   I & D EXTREMITY Bilateral 10/12/2022   Procedure: IRRIGATION AND  DEBRIDEMENT  BILATERAL LEGS;  Surgeon: Celena Sharper, MD;  Location: [facility] OR;  Service: Orthopedics;  Laterality: Bilateral;   I & D EXTREMITY Right 10/13/2022   Procedure: RIGHT THIGH WOUND VAC EXCHANGE;  Surgeon: Kendal Franky SQUIBB, MD;  Location: [facility] OR;  Service: Orthopedics;  Laterality: Right;   I & D EXTREMITY Bilateral 10/17/2022   Procedure: BILATERAL THIGH AND LEG DEBRIDEMENT, PARTIAL BILATERAL FOOT AMPUTATION;  Surgeon: Harden Jerona GAILS, MD;  Location: [facility] OR;  Service: Orthopedics;  Laterality: Bilateral;   I & D EXTREMITY Bilateral 11/05/2022   Procedure: BILATERAL THIGH DEBRIDEMENT;  Surgeon: Harden Jerona GAILS, MD;  Location: [facility] OR;  Service: Orthopedics;  Laterality: Bilateral;   I & D EXTREMITY Bilateral 11/07/2022   Procedure: BILATERAL THIGH AND LEG DEBRIDEMENT;  Surgeon: Harden Jerona GAILS, MD;  Location: [facility] OR;  Service: Orthopedics;  Laterality: Bilateral;   I & D EXTREMITY Bilateral 11/12/2022   Procedure: BILATERAL LEG DEBRIDEMENTS;  Surgeon: Harden Jerona GAILS, MD;  Location: [facility] OR;  Service: Orthopedics;  Laterality: Bilateral;   IRRIGATION AND DEBRIDEMENT FOOT Bilateral 10/05/2022   Procedure: IRRIGATION AND DEBRIDEMENT FOOT;  Surgeon: Janit Thresa HERO, DPM;  Location: [facility]S;  Service: Orthopedics/Podiatry;  Laterality: Bilateral;   WISDOM TOOTH  EXTRACTION     Social History:  reports that she has never smoked. She has never used smokeless tobacco. She reports current alcohol use of about 1.0 standard drink of alcohol per week. She reports that she does not use drugs.  Allergies  Allergen Reactions   Ibuprofen  Other (See Comments)    07/23/22 - Worsening kidney function after 3 doses of ibuprofen  400mg .  Creatinine increased from 1.3 to 2.  Creatinine returned to normal after stopping.   Lisinopril  Cough, Other (See Comments), Itching and Rash    Breakout in rash    Family History  Problem Relation Age of Onset   Lung cancer Mother    Diabetes Mother    Bipolar disorder Father    Liver cancer Maternal Grandfather    Diabetes Maternal Aunt        x 2   Pancreatic cancer Maternal Uncle    Prostate cancer Maternal Uncle    Breast cancer Other        maternal great aunt   Heart disease Other    Ovarian cancer Other        maternal great aunt   Kidney disease Other        maternal great aunt   Colon cancer Neg Hx    Stomach cancer Neg Hx     Prior to Admission medications   Medication Sig Start Date End Date Taking? Authorizing Provider  acetaminophen  (TYLENOL ) 325 MG  tablet Take 2 tablets (650 mg total) by mouth every 6 (six) hours as needed for mild pain, fever or headache. 11/22/22   Lue Elsie BROCKS, MD  amLODipine  (NORVASC ) 10 MG tablet Take 1 tablet (10 mg total) by mouth daily. 12/04/22   Elgergawy, Brayton RAMAN, MD  ascorbic acid  (VITAMIN C ) 1000 MG tablet Take 1 tablet (1,000 mg total) by mouth daily. 11/22/22   Lue Elsie BROCKS, MD  carvedilol  (COREG ) 6.25 MG tablet Take 1 tablet (6.25 mg total) by mouth 2 (two) times daily with a meal. 12/03/22   Elgergawy, Brayton RAMAN, MD  Continuous Glucose Sensor (DEXCOM G6 SENSOR) MISC 1 Device by Does not apply route as directed. 06/18/22   Shamleffer, Ibtehal Jaralla, MD  Continuous Glucose Transmitter (DEXCOM G6 TRANSMITTER) MISC 1 Device by Does not apply route as directed.  06/18/22   Shamleffer, Ibtehal Jaralla, MD  etonogestrel  (NEXPLANON ) 68 MG IMPL implant 1 each by Subdermal route continuous.    [provider]  famotidine  (PEPCID ) 20 MG tablet Take 1 tablet (20 mg total) by mouth 2 (two) times daily. 12/03/22   Elgergawy, Brayton RAMAN, MD  feeding supplement, GLUCERNA SHAKE, (GLUCERNA SHAKE) LIQD Take 237 mLs by mouth 3 (three) times daily between meals. 11/22/22   Lue Elsie BROCKS, MD  furosemide  (LASIX ) 20 MG tablet Take 1 tablet (20 mg total) by mouth daily as needed for edema or fluid. 12/03/22   Elgergawy, Brayton RAMAN, MD  gabapentin  (NEURONTIN ) 300 MG capsule Take 1 capsule (300 mg total) by mouth every 8 (eight) hours. 11/22/22   Lue Elsie BROCKS, MD  HYDROmorphone  (DILAUDID ) 2 MG tablet Take 1 tablet (2 mg total) by mouth every 4 (four) hours as needed for severe pain (pain score 7-10) or moderate pain (pain score 4-6). 12/03/22   Elgergawy, Brayton RAMAN, MD  insulin  aspart (NOVOLOG ) 100 UNIT/ML injection Inject 0-9 Units into the skin 3 (three) times daily with meals. 12/03/22   Elgergawy, Brayton RAMAN, MD  insulin  detemir (LEVEMIR  FLEXTOUCH) 100 UNIT/ML FlexPen Inject 4 Units into the skin 2 (two) times daily. 11/22/22   Lue Elsie BROCKS, MD  Insulin  Pen Needle 32G X 4 MM MISC 1 Device by Does not apply route in the morning, at noon, in the evening, and at bedtime. 06/18/22   Shamleffer, Donell Cardinal, MD  MAGNESIUM  PO Take 1 tablet by mouth daily.    [provider]  methocarbamol  (ROBAXIN ) 500 MG tablet Take 500 mg by mouth 3 (three) times daily as needed for muscle spasms. 11/22/22   [provider]  metoCLOPramide  (REGLAN ) 10 MG tablet Take 1 tablet (10 mg total) by mouth every 8 (eight) hours as needed for nausea. 01/23/23   Adhikari, Amrit, MD  Multiple Vitamin (MULTIVITAMIN WITH MINERALS) TABS tablet Take 1 tablet by mouth daily. 11/22/22   Lue Elsie BROCKS, MD  nutrition supplement, JUVEN, RAWLEIGH) PACK Take 1 packet by mouth 2  (two) times daily between meals. Patient not taking: Reported on 01/21/2023 11/22/22   Lue Elsie BROCKS, MD  potassium chloride  SA (KLOR-CON  M) 20 MEQ tablet Take 1 tablet (20 mEq total) by mouth 2 (two) times daily for 5 days. 01/23/23 01/28/23  Jillian Buttery, MD  sucralfate  (CARAFATE ) 1 g tablet Take 1 g by mouth in the morning, at noon, and at bedtime. 08/28/22   [provider]  traZODone  (DESYREL ) 150 MG tablet Take 150 mg by mouth at bedtime. 01/14/23   [provider]    Physical Exam: Vitals:  02/21/23 0817 02/21/23 0818 02/21/23 0945 02/21/23 1224  BP: (!) 159/110  (!) 173/133   Pulse: (!) 136  (!) 119   Resp: (!) 24  15   Temp: 98.2 F (36.8 C)   98.1 F (36.7 C)  TempSrc:    Oral  SpO2: 100% 99% 99%    Exam  Constitutional: Elderly female who appears appears to be in  discomfort Eyes: PERRL, lids and conjunctivae normal ENMT: Mucous membranes are moist.  Normal dentition.  Neck: normal, supple  Respiratory: clear to auscultation bilaterally, no wheezing, no crackles. Normal respiratory effort. No accessory muscle use.  Cardiovascular: Tachycardic. No extremity edema. 2+ pedal pulses. No carotid bruits.  Abdomen: Generalized tenderness to palpation.  No guarding appreciated.  Bowel sounds positive.  Musculoskeletal: no clubbing / cyanosis.  Bilateral AKA. Skin: no rashes, lesions, ulcers. No induration Neurologic: CN 2-12 grossly intact.   Strength 5/5 in all 4.  Psychiatric: Normal judgment and insight. Alert and oriented x 3. Normal mood.   Data Reviewed:  EKG revealed sinus tachycardia 115 bpm  Assessment and Plan:  Intractable nausea and vomiting Diabetic gastroparesis Patient presents with acute onset of nausea and vomiting at around 3 AM in the morning and reports of abdominal pain.  Frequent admissions for the same.  Patient unable to tolerate any p.o. currently at this time.  Noted to have normal bowel sounds on physical exam.  Suspect  secondary to patient's history of gastroparesis.  Patient had been given IV fluids and antiemetics without improvement in symptoms. -Admit to medical telemetry bed -Aspiration precautions with elevation head of bed -Check acute abdominal series -Clear liquid diet and advance as tolerated to carb modified -Reglan  10 mg IV every 8 hours -Antiemetics as needed  Acute kidney injury Creatinine elevated at 1.56 with BUN 24.  Creatinine previously had been 1.26 when checked on 12/15.  Suspect secondary to reports of nausea and vomiting.  Patient had been bolused at least 2 L of IV fluids in the ED. -Check urinalysis once able -Continue IV fluids normal saline at 75 mL/h -Recheck kidney function in a.m.  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  Noted to have pseudohyponatremia of 133 that corrects within normal limits when adjusted for hyperglycemia.  Last hemoglobin A1c was noted to be 5.9 when checked on 11/18/2022.  Emergency department patient had been given a total of 30 units of short acting insulin .  Patient reports being long-acting insulin  10 units twice daily and 0 to 15 units 3 times daily with meals. -Hypoglycemic protocols -Semglee  10 units twice daily -CBGs before every meal with moderate SSI  Hypertensive urgency On admission patient blood pressures noted to be elevated up to 173/133. -Resume home blood pressure medications of amlodipine  and Coreg  -Hydralazine  IV as needed for elevated blood pressures  Chronic pain S/p bilateral AKA Patient has a history of chronic abdominal pain as well as neuropathic pain with phantom limb -Continue gabapentin   Moderate protein calorie malnutrition Albumin  noted to be chronically low at 1.8. -Glucerna shakes with meals  GERD -Continue Protonix   DVT prophylaxis: Lovenox   Advance Care Planning:   Code  Status: Full Code   Consults: None  Family Communication: None requested  Severity of Illness: The appropriate patient status for this patient is OBSERVATION. Observation status is judged to be reasonable and necessary in order to provide the required intensity of  service to ensure the patient's safety. The patient's presenting symptoms, physical exam findings, and initial radiographic and laboratory data in the context of their medical condition is felt to place them at decreased risk for further clinical deterioration. Furthermore, it is anticipated that the patient will be medically stable for discharge from the hospital within 2 midnights of admission.   Author: Maximino DELENA Sharps, MD 02/21/2023 2:27 PM  For on call review [email or web address].

## 2023-02-21 NOTE — ED Provider Notes (Signed)
 [facility] [place] [facility] AT [facility] Provider Note   CSN: [id number] Arrival date & time: 02/21/23  9187     History  Chief Complaint  Patient presents with   Abdominal Pain    Kelli Hudson is a 33 y.o. female past medical history seen for hypertension, type 1 diabetes, hypertension, drug-seeking behavior, gastroparesis, bilateral AKA, who presents with concern for abdominal pain, nausea, vomiting, and elevated blood sugar.  Patient reports that this happens frequently.  CBG with EMS was 549.  Patient reports Zofran  does not work.  She is begging for Dilaudid  on initial evaluation saying it is the only thing that works for her.  We discussed that this is not an appropriate management of gastroparesis and she continues to insist that she needs Dilaudid .  She has a history of multiple and appropriate request for drugs, including coming into the provider box to try to speak with the provider during an ED visit, as well as trying to take a syringe from a nurses hand.  She denies vomiting of blood.   Abdominal Pain      Home Medications Prior to Admission medications   Medication Sig Start Date End Date Taking? Authorizing Provider  acetaminophen  (TYLENOL ) 325 MG tablet Take 2 tablets (650 mg total) by mouth every 6 (six) hours as needed for mild pain, fever or headache. 11/22/22   Lue Elsie BROCKS, MD  amLODipine  (NORVASC ) 10 MG tablet Take 1 tablet (10 mg total) by mouth daily. 12/04/22   Elgergawy, Brayton RAMAN, MD  ascorbic acid  (VITAMIN C ) 1000 MG tablet Take 1 tablet (1,000 mg total) by mouth daily. 11/22/22   Lue Elsie BROCKS, MD  carvedilol  (COREG ) 6.25 MG tablet Take 1 tablet (6.25 mg total) by mouth 2 (two) times daily with a meal. 12/03/22   Elgergawy, Brayton RAMAN, MD  Continuous Glucose Sensor (DEXCOM G6 SENSOR) MISC 1 Device by Does not apply route as directed. 06/18/22   Shamleffer, Ibtehal Jaralla, MD  Continuous Glucose Transmitter (DEXCOM G6  TRANSMITTER) MISC 1 Device by Does not apply route as directed. 06/18/22   Shamleffer, Ibtehal Jaralla, MD  etonogestrel  (NEXPLANON ) 68 MG IMPL implant 1 each by Subdermal route continuous.    [provider]  famotidine  (PEPCID ) 20 MG tablet Take 1 tablet (20 mg total) by mouth 2 (two) times daily. 12/03/22   Elgergawy, Brayton RAMAN, MD  feeding supplement, GLUCERNA SHAKE, (GLUCERNA SHAKE) LIQD Take 237 mLs by mouth 3 (three) times daily between meals. 11/22/22   Lue Elsie BROCKS, MD  furosemide  (LASIX ) 20 MG tablet Take 1 tablet (20 mg total) by mouth daily as needed for edema or fluid. 12/03/22   Elgergawy, Brayton RAMAN, MD  gabapentin  (NEURONTIN ) 300 MG capsule Take 1 capsule (300 mg total) by mouth every 8 (eight) hours. 11/22/22   Lue Elsie BROCKS, MD  HYDROmorphone  (DILAUDID ) 2 MG tablet Take 1 tablet (2 mg total) by mouth every 4 (four) hours as needed for severe pain (pain score 7-10) or moderate pain (pain score 4-6). 12/03/22   Elgergawy, Brayton RAMAN, MD  insulin  aspart (NOVOLOG ) 100 UNIT/ML injection Inject 0-9 Units into the skin 3 (three) times daily with meals. 12/03/22   Elgergawy, Brayton RAMAN, MD  insulin  detemir (LEVEMIR  FLEXTOUCH) 100 UNIT/ML FlexPen Inject 4 Units into the skin 2 (two) times daily. 11/22/22   Lue Elsie BROCKS, MD  Insulin  Pen Needle 32G X 4 MM MISC 1 Device by Does not apply route in the morning, at noon, in the  evening, and at bedtime. 06/18/22   Shamleffer, Donell Cardinal, MD  MAGNESIUM  PO Take 1 tablet by mouth daily.    [provider]  methocarbamol  (ROBAXIN ) 500 MG tablet Take 500 mg by mouth 3 (three) times daily as needed for muscle spasms. 11/22/22   [provider]  metoCLOPramide  (REGLAN ) 10 MG tablet Take 1 tablet (10 mg total) by mouth every 8 (eight) hours as needed for nausea. 01/23/23   Adhikari, Amrit, MD  Multiple Vitamin (MULTIVITAMIN WITH MINERALS) TABS tablet Take 1 tablet by mouth daily. 11/22/22   Lue Elsie BROCKS, MD   nutrition supplement, JUVEN, RAWLEIGH) PACK Take 1 packet by mouth 2 (two) times daily between meals. Patient not taking: Reported on 01/21/2023 11/22/22   Lue Elsie BROCKS, MD  potassium chloride  SA (KLOR-CON  M) 20 MEQ tablet Take 1 tablet (20 mEq total) by mouth 2 (two) times daily for 5 days. 01/23/23 01/28/23  Jillian Buttery, MD  sucralfate  (CARAFATE ) 1 g tablet Take 1 g by mouth in the morning, at noon, and at bedtime. 08/28/22   [provider]  traZODone  (DESYREL ) 150 MG tablet Take 150 mg by mouth at bedtime. 01/14/23   [provider]      Allergies    Ibuprofen  and Lisinopril     Review of Systems   Review of Systems  Gastrointestinal:  Positive for abdominal pain.  All other systems reviewed and are negative.   Physical Exam Updated Vital Signs BP (!) 173/133   Pulse (!) 119   Temp 98.1 F (36.7 C) (Oral)   Resp 15   SpO2 99%  Physical Exam Vitals and nursing note reviewed.  Constitutional:      General: She is not in acute distress.    Appearance: Normal appearance.  HENT:     Head: Normocephalic and atraumatic.  Eyes:     General:        Right eye: No discharge.        Left eye: No discharge.  Cardiovascular:     Rate and Rhythm: Regular rhythm. Tachycardia present.     Heart sounds: No murmur heard.    No friction rub. No gallop.  Pulmonary:     Effort: Pulmonary effort is normal.     Breath sounds: Normal breath sounds.  Abdominal:     General: Bowel sounds are normal.     Palpations: Abdomen is soft.  Skin:    General: Skin is warm and dry.     Capillary Refill: Capillary refill takes less than 2 seconds.  Neurological:     Mental Status: She is alert and oriented to person, place, and time.  Psychiatric:        Mood and Affect: Mood normal.        Behavior: Behavior normal.     ED Results / Procedures / Treatments   Labs (all labs ordered are listed, but only abnormal results are displayed) Labs Reviewed  CBC WITH  DIFFERENTIAL/PLATELET - Abnormal; Notable for the following components:      Result Value   Platelets 483 (*)    All other components within normal limits  COMPREHENSIVE METABOLIC PANEL - Abnormal; Notable for the following components:   Sodium 133 (*)    CO2 20 (*)    Glucose, Bld 481 (*)    BUN 24 (*)    Creatinine, Ser 1.56 (*)    Total Protein 6.2 (*)    Albumin  1.8 (*)    Alkaline Phosphatase 161 (*)  GFR, Estimated 45 (*)    All other components within normal limits  CBG MONITORING, ED - Abnormal; Notable for the following components:   Glucose-Capillary 454 (*)    All other components within normal limits  CBG MONITORING, ED - Abnormal; Notable for the following components:   Glucose-Capillary 385 (*)    All other components within normal limits  CBG MONITORING, ED - Abnormal; Notable for the following components:   Glucose-Capillary 351 (*)    All other components within normal limits  CBG MONITORING, ED - Abnormal; Notable for the following components:   Glucose-Capillary 350 (*)    All other components within normal limits  LIPASE, BLOOD  HCG, SERUM, QUALITATIVE  URINALYSIS, ROUTINE W REFLEX MICROSCOPIC    EKG None  Radiology No results found.  Procedures Procedures    Medications Ordered in ED Medications  droperidol  (INAPSINE ) 2.5 MG/ML injection 2.5 mg (has no administration in time range)  droperidol  (INAPSINE ) 2.5 MG/ML injection 2.5 mg (2.5 mg Intravenous Given 02/21/23 1007)  sodium chloride  0.9 % bolus 1,000 mL (0 mLs Intravenous Stopped 02/21/23 1215)  insulin  aspart (novoLOG ) injection 8 Units (8 Units Intravenous Given 02/21/23 1008)  ondansetron  (ZOFRAN ) injection 4 mg (4 mg Intravenous Given 02/21/23 1128)  insulin  aspart (novoLOG ) injection 8 Units (8 Units Intravenous Given 02/21/23 1215)  sodium chloride  0.9 % bolus 1,000 mL (1,000 mLs Intravenous New Bag/Given 02/21/23 1315)  insulin  aspart (novoLOG ) injection 6 Units (6 Units Intravenous Given  02/21/23 1315)  acetaminophen  (TYLENOL ) tablet 650 mg (650 mg Oral Given 02/21/23 1315)  insulin  aspart (novoLOG ) injection 8 Units (8 Units Intravenous Given 02/21/23 1411)    ED Course/ Medical Decision Making/ A&P Clinical Course as of 02/21/23 1433  Sat Feb 21, 2023  1228 Multiple times throughout her visit patient has called provider into the room and tearfully said I am begging for Dilaudid .  Informed patient that Dilaudid  is not appropriate treatment for gastroparesis and I will not be providing any. [CP]  1239 Patient asked the nurse to steal her dilaudid  when we again discussed that narcotics are not appropriate at this time [CP]    Clinical Course User Index [CP] Rosan Sherlean DEL, PA-C                                 Medical Decision Making Risk Prescription drug management.   This patient is a 34 y.o. female  who presents to the ED for concern of abdominal pain, nausea, vomiting, hyperglycemia.   Differential diagnoses prior to evaluation: The emergent differential diagnosis includes, but is not limited to,  The causes of generalized abdominal pain include but are not limited to AAA, mesenteric ischemia, appendicitis, diverticulitis, DKA, gastritis, gastroenteritis, AMI, nephrolithiasis, pancreatitis, peritonitis, adrenal insufficiency,lead poisoning, iron  toxicity, intestinal ischemia, constipation, UTI,SBO/LBO, splenic rupture, biliary disease, IBD, IBS, PUD, or hepatitis. This is not an exhaustive differential.  High clinical suspicion for ongoing gastroparesis/chronic abdominal pain, plus or minus DKA/HHS versus simple hyperglycemia, along with suspect likely drug-seeking behavior given her initial presentation.  Past Medical History / Co-morbidities / Social History: Hypertension, diabetes, drug-seeking behavior, gastroparesis, bilateral AKA  Additional history: Chart reviewed. Pertinent results include: Reviewed lab work, imaging from patient's frequent previous  emergency department visits, outpatient PCP visits.  Physical Exam: Physical exam performed. The pertinent findings include: Initially quite tachycardic, pulse of 136 on arrival, she has been hypertensive in the ED, initially 159/110, repeat 173/133.  She has not been actively vomiting during her stay so far despite endorsing significant nausea.  Lab Tests/Imaging studies: I personally interpreted labs/imaging and the pertinent results include: CMP notable for pseudohyponatremia, sodium 133 with glucose of 481, she has elevated BUN, creatinine although not significantly changed from baseline, she has chronic protein deficiency, total protein 6.2, albumin  1.8.  Alk phos is mildly elevated at 161.  CBC overall unremarkable other than mild thrombocytosis, platelets 483.  Likely acute phase reactant from her abdominal pain.  Her lipase is normal, serum pregnancy test negative.  Her blood sugar has been persistently elevated despite receiving multiple rounds of fluids and insulin , we will administer 1 additional round of insulin  and fluids, recheck, if she remains persistently elevated, or is having any active vomiting we will plan for admission.  On reevaluation patient still persistently vomiting, persistently tachycardic despite receiving 20 units of insulin , 2 L of fluid, multiple rounds of nausea medication.  I do think that she would benefit from mission at this time, will reach out to the hospitalist for admission.   Medications: I ordered medication including fluids, droperidol , Zofran , insulin  for nausea, hyperglycemia, dehydration.  I have reviewed the patients home medicines and have made adjustments as needed.   Consults: I spoke to the hospitalist, Dr. Claudene who agrees to admission for this patient for intractable nausea and vomiting  Final Clinical Impression(s) / ED Diagnoses Final diagnoses:  Intractable nausea and vomiting    Rx / DC Orders ED Discharge Orders     None          Rosan Sherlean VEAR DEVONNA 02/21/23 1433    Patsey Lot, MD 02/21/23 1459

## 2023-02-21 NOTE — ED Notes (Signed)
 Pt stated "what does the dr expect me to do if I can not have dilaudid to control my pain?" She is stating that she needs a dr to come and talk to her.

## 2023-02-21 NOTE — ED Notes (Signed)
 Hooked patient up to cardiac monitor and pt stated "I want a new DR because he will not give me dilaudid for my pain." Pt states they are in "so much pain" and emesis present.

## 2023-02-21 NOTE — ED Provider Triage Note (Signed)
 Emergency Medicine Provider Triage Evaluation Note  Breona Cherubin , a 33 y.o. female  was evaluated in triage.  Pt complains of abdominal pain and hyperglycemia since this morning. Last BM yesterday and normal.  Hx of diabetic gastroparesis  Review of Systems  Positive: Abdominal pain, N/V Negative: fever  Physical Exam  BP (!) 159/110 (BP Location: Right Arm)   Pulse (!) 136   Temp 98.2 F (36.8 C)   Resp (!) 24   SpO2 99%  Gen:   Awake, no distress   Resp:  Normal effort  MSK:   Moves extremities without difficulty. BKA bilaterally  Other:    Medical Decision Making  Medically screening exam initiated at 8:36 AM.  Appropriate orders placed.  Terisha Johnson-Alston was informed that the remainder of the evaluation will be completed by another provider, this initial triage assessment does not replace that evaluation, and the importance of remaining in the ED until their evaluation is complete.  Labs ordered   Minnie Tinnie BRAVO, [place] 02/21/23 [phone]

## 2023-02-21 NOTE — ED Notes (Signed)
 Pt refused to put EKG leads back on reported to nurse that pt did not want to put leads back on.

## 2023-02-21 NOTE — ED Notes (Signed)
 Pt asked this RN to steal some dilaudid for her.

## 2023-02-22 DIAGNOSIS — E1065 Type 1 diabetes mellitus with hyperglycemia: Secondary | ICD-10-CM | POA: Diagnosis present

## 2023-02-22 DIAGNOSIS — Z79899 Other long term (current) drug therapy: Secondary | ICD-10-CM | POA: Diagnosis not present

## 2023-02-22 DIAGNOSIS — E1040 Type 1 diabetes mellitus with diabetic neuropathy, unspecified: Secondary | ICD-10-CM | POA: Diagnosis present

## 2023-02-22 DIAGNOSIS — E1043 Type 1 diabetes mellitus with diabetic autonomic (poly)neuropathy: Secondary | ICD-10-CM | POA: Diagnosis present

## 2023-02-22 DIAGNOSIS — E1022 Type 1 diabetes mellitus with diabetic chronic kidney disease: Secondary | ICD-10-CM | POA: Diagnosis present

## 2023-02-22 DIAGNOSIS — Z888 Allergy status to other drugs, medicaments and biological substances status: Secondary | ICD-10-CM | POA: Diagnosis not present

## 2023-02-22 DIAGNOSIS — N1831 Chronic kidney disease, stage 3a: Secondary | ICD-10-CM | POA: Diagnosis present

## 2023-02-22 DIAGNOSIS — E785 Hyperlipidemia, unspecified: Secondary | ICD-10-CM | POA: Diagnosis present

## 2023-02-22 DIAGNOSIS — N179 Acute kidney failure, unspecified: Secondary | ICD-10-CM | POA: Diagnosis present

## 2023-02-22 DIAGNOSIS — Z801 Family history of malignant neoplasm of trachea, bronchus and lung: Secondary | ICD-10-CM | POA: Diagnosis not present

## 2023-02-22 DIAGNOSIS — I160 Hypertensive urgency: Secondary | ICD-10-CM | POA: Diagnosis present

## 2023-02-22 DIAGNOSIS — K219 Gastro-esophageal reflux disease without esophagitis: Secondary | ICD-10-CM | POA: Diagnosis present

## 2023-02-22 DIAGNOSIS — E1143 Type 2 diabetes mellitus with diabetic autonomic (poly)neuropathy: Secondary | ICD-10-CM | POA: Diagnosis not present

## 2023-02-22 DIAGNOSIS — D75839 Thrombocytosis, unspecified: Secondary | ICD-10-CM | POA: Diagnosis present

## 2023-02-22 DIAGNOSIS — I129 Hypertensive chronic kidney disease with stage 1 through stage 4 chronic kidney disease, or unspecified chronic kidney disease: Secondary | ICD-10-CM | POA: Diagnosis present

## 2023-02-22 DIAGNOSIS — Z6821 Body mass index (BMI) 21.0-21.9, adult: Secondary | ICD-10-CM | POA: Diagnosis not present

## 2023-02-22 DIAGNOSIS — R112 Nausea with vomiting, unspecified: Secondary | ICD-10-CM | POA: Diagnosis present

## 2023-02-22 DIAGNOSIS — Z89611 Acquired absence of right leg above knee: Secondary | ICD-10-CM | POA: Diagnosis not present

## 2023-02-22 DIAGNOSIS — E860 Dehydration: Secondary | ICD-10-CM | POA: Diagnosis present

## 2023-02-22 DIAGNOSIS — Z765 Malingerer [conscious simulation]: Secondary | ICD-10-CM | POA: Diagnosis not present

## 2023-02-22 DIAGNOSIS — Z886 Allergy status to analgesic agent status: Secondary | ICD-10-CM | POA: Diagnosis not present

## 2023-02-22 DIAGNOSIS — E440 Moderate protein-calorie malnutrition: Secondary | ICD-10-CM | POA: Diagnosis present

## 2023-02-22 DIAGNOSIS — G8929 Other chronic pain: Secondary | ICD-10-CM | POA: Diagnosis present

## 2023-02-22 DIAGNOSIS — Z794 Long term (current) use of insulin: Secondary | ICD-10-CM | POA: Diagnosis not present

## 2023-02-22 DIAGNOSIS — K3184 Gastroparesis: Secondary | ICD-10-CM | POA: Diagnosis present

## 2023-02-22 DIAGNOSIS — Z89612 Acquired absence of left leg above knee: Secondary | ICD-10-CM | POA: Diagnosis not present

## 2023-02-22 LAB — CBC
HCT: 35 % — ABNORMAL LOW (ref 36.0–46.0)
Hemoglobin: 11.1 g/dL — ABNORMAL LOW (ref 12.0–15.0)
MCH: 27.6 pg (ref 26.0–34.0)
MCHC: 31.7 g/dL (ref 30.0–36.0)
MCV: 87.1 fL (ref 80.0–100.0)
Platelets: 464 10*3/uL — ABNORMAL HIGH (ref 150–400)
RBC: 4.02 MIL/uL (ref 3.87–5.11)
RDW: 15 % (ref 11.5–15.5)
WBC: 16.6 10*3/uL — ABNORMAL HIGH (ref 4.0–10.5)
nRBC: 0 % (ref 0.0–0.2)

## 2023-02-22 LAB — BASIC METABOLIC PANEL
Anion gap: 11 (ref 5–15)
BUN: 25 mg/dL — ABNORMAL HIGH (ref 6–20)
CO2: 20 mmol/L — ABNORMAL LOW (ref 22–32)
Calcium: 8.7 mg/dL — ABNORMAL LOW (ref 8.9–10.3)
Chloride: 108 mmol/L (ref 98–111)
Creatinine, Ser: 1.52 mg/dL — ABNORMAL HIGH (ref 0.44–1.00)
GFR, Estimated: 47 mL/min — ABNORMAL LOW (ref >60)
Glucose, Bld: 147 mg/dL — ABNORMAL HIGH (ref 70–99)
Potassium: 3.1 mmol/L — ABNORMAL LOW (ref 3.5–5.1)
Sodium: 139 mmol/L (ref 135–145)

## 2023-02-22 LAB — GLUCOSE, CAPILLARY
Glucose-Capillary: 194 mg/dL — ABNORMAL HIGH (ref 70–99)
Glucose-Capillary: 143 mg/dL — ABNORMAL HIGH (ref 70–99)
Glucose-Capillary: 202 mg/dL — ABNORMAL HIGH (ref 70–99)
Glucose-Capillary: 129 mg/dL — ABNORMAL HIGH (ref 70–99)
Glucose-Capillary: 77 mg/dL (ref 70–99)
Glucose-Capillary: 58 mg/dL — ABNORMAL LOW (ref 70–99)
Glucose-Capillary: 66 mg/dL — ABNORMAL LOW (ref 70–99)

## 2023-02-22 NOTE — Progress Notes (Signed)
 Offered to apply tele to pt. Pt continues to refuse. Educated pt on the use and importance of having tele monitoring especially with her elevated BP's but she continued to refuse.

## 2023-02-22 NOTE — Assessment & Plan Note (Signed)
 Previous notes describe opiate seeking behavior.  To me, she asks appropriate questions, is sad, clearly has had experience of IV opiates making her feel transiently better, I explained the role of opiates and exacerbating gastroparesis. - Continue Reglan  - Continue IV fluids - Advance diet as tolerated - As needed Compazine  or Zofran  for refractory nausea - Avoid opiates

## 2023-02-22 NOTE — Assessment & Plan Note (Signed)
 Creatinine 1.56 on admission, improving with fluids - Hold Lasix

## 2023-02-22 NOTE — Assessment & Plan Note (Signed)
As evidenced by moderate loss of subcutaneous muscle mass and fat 

## 2023-02-22 NOTE — Hospital Course (Addendum)
 33 y.o. F with recent bilateral AKA, DM, recurrent DKA, hx Cdiff, HTN, and gastroparesis who presented with intractable nausea and vomiting for few days.  In the ER, lipase normal.  Abdominal radiograph unremarkable.  She was unable to tolerate PO, so admitted on scheduled Reglan  for gastroparesis flare.

## 2023-02-22 NOTE — Progress Notes (Signed)
  Progress Note   Patient: Kelli Hudson FMW:[id number] DOB: January 14, 1991 DOA: 02/21/2023     0 DOS: the patient was seen and examined on 02/22/2023 at 10:32 AM      Brief hospital course: 33 y.o. F with recent bilateral AKA, DM, recurrent DKA, hx Cdiff, HTN, and gastroparesis who presented with intractable nausea and vomiting for few days.  In the ER, lipase normal.  Abdominal radiograph unremarkable.  She was unable to tolerate PO, so admitted on scheduled Reglan  for gastroparesis flare.     Assessment and Plan: Diabetic gastroparesis (HCC) Previous notes describe opiate seeking behavior.  To me, she asks appropriate questions, is sad, clearly has had experience of IV opiates making her feel transiently better, I explained the role of opiates and exacerbating gastroparesis. 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  She is unable to eat or drink yet.     Physical Exam: BP 99/65 (BP Location: Left Arm)   Pulse 100   Temp 98 F (36.7 C) (Oral)   Resp 18   SpO2 98%   Thin adult female, lying in bed, rolled on her side, appears weak and tired Tachycardic, regular, no murmurs, no pitting in the stump Respiratory rate  normal, lungs clear without rales or wheezes Abdomen with voluntary guarding Attention normal, affect sad, judgment and insight appear normal, speech fluent, face symmetric, moves upper extremities with normal strength and coordination    Data Reviewed: Basic metabolic panel shows sodium 133, creatinine 1.5 Abdomen x-ray normal Lipase normal CBC normal    Family Communication:     Disposition:   Patient requires ongoing IV fluids given unable to take p.o.       Author: Lonni SHAUNNA Dalton, MD 02/22/2023 10:40 AM  For on call review [email or web address].

## 2023-02-22 NOTE — Assessment & Plan Note (Signed)
 Blood pressure elevated due to pain - Continue amlodipine, carvedilol -As needed hydralazine

## 2023-02-22 NOTE — Assessment & Plan Note (Signed)
 Glucose is elevated - Continue glargine - Sliding scale corrections - Hold home Levemir

## 2023-02-22 NOTE — Assessment & Plan Note (Signed)
 PDMP reviewed, no outpatient prescriptions for opiates

## 2023-02-22 NOTE — Care Management Obs Status (Signed)
 MEDICARE OBSERVATION STATUS NOTIFICATION   Patient Details  Name: Kelli Hudson MRN: [id number] Date of Birth: 29-May-1990   Medicare Observation Status Notification Given:  Yes    Lawerance Sabal, RN 02/22/2023, 8:02 AM

## 2023-02-23 DIAGNOSIS — G8929 Other chronic pain: Secondary | ICD-10-CM | POA: Diagnosis not present

## 2023-02-23 DIAGNOSIS — N179 Acute kidney failure, unspecified: Secondary | ICD-10-CM | POA: Diagnosis not present

## 2023-02-23 DIAGNOSIS — I160 Hypertensive urgency: Secondary | ICD-10-CM | POA: Diagnosis not present

## 2023-02-23 DIAGNOSIS — E1143 Type 2 diabetes mellitus with diabetic autonomic (poly)neuropathy: Secondary | ICD-10-CM | POA: Diagnosis not present

## 2023-02-23 LAB — CBC
HCT: 34.2 % — ABNORMAL LOW (ref 36.0–46.0)
Hemoglobin: 10.7 g/dL — ABNORMAL LOW (ref 12.0–15.0)
MCH: 27.7 pg (ref 26.0–34.0)
MCHC: 31.3 g/dL (ref 30.0–36.0)
MCV: 88.6 fL (ref 80.0–100.0)
Platelets: 477 10*3/uL — ABNORMAL HIGH (ref 150–400)
RBC: 3.86 MIL/uL — ABNORMAL LOW (ref 3.87–5.11)
RDW: 15.2 % (ref 11.5–15.5)
WBC: 10.4 10*3/uL (ref 4.0–10.5)
nRBC: 0 % (ref 0.0–0.2)

## 2023-02-23 LAB — GLUCOSE, CAPILLARY
Glucose-Capillary: 122 mg/dL — ABNORMAL HIGH (ref 70–99)
Glucose-Capillary: 160 mg/dL — ABNORMAL HIGH (ref 70–99)
Glucose-Capillary: 179 mg/dL — ABNORMAL HIGH (ref 70–99)
Glucose-Capillary: 210 mg/dL — ABNORMAL HIGH (ref 70–99)
Glucose-Capillary: 216 mg/dL — ABNORMAL HIGH (ref 70–99)

## 2023-02-23 LAB — BASIC METABOLIC PANEL
Anion gap: 9 (ref 5–15)
BUN: 17 mg/dL (ref 6–20)
CO2: 22 mmol/L (ref 22–32)
Calcium: 8.4 mg/dL — ABNORMAL LOW (ref 8.9–10.3)
Chloride: 109 mmol/L (ref 98–111)
Creatinine, Ser: 1.6 mg/dL — ABNORMAL HIGH (ref 0.44–1.00)
GFR, Estimated: 44 mL/min — ABNORMAL LOW (ref >60)
Glucose, Bld: 165 mg/dL — ABNORMAL HIGH (ref 70–99)
Potassium: 3 mmol/L — ABNORMAL LOW (ref 3.5–5.1)
Sodium: 140 mmol/L (ref 135–145)

## 2023-02-23 MED ORDER — INSULIN ASPART 100 UNIT/ML IJ SOLN: 0.0000 [IU] | Freq: Three times a day (TID) | INTRAMUSCULAR | Status: DC

## 2023-02-23 MED ORDER — POTASSIUM CHLORIDE CRYS ER 20 MEQ PO TBCR: 20.0000 meq | EXTENDED_RELEASE_TABLET | Freq: Every day | ORAL | 0 refills | Status: DC

## 2023-02-23 MED ORDER — INSULIN ASPART 100 UNIT/ML IJ SOLN
2.0000 [IU] | Freq: Three times a day (TID) | INTRAMUSCULAR | Status: DC
  Administered 2023-02-23: 2 [IU] via SUBCUTANEOUS

## 2023-02-23 MED ORDER — POTASSIUM CHLORIDE 10 MEQ/100ML IV SOLN
10.0000 meq | INTRAVENOUS | Status: AC
Start: 2023-02-23 — End: 2023-02-23
  Administered 2023-02-23 (×4): 10 meq via INTRAVENOUS
  Filled 2023-02-23 (×4): qty 100

## 2023-02-23 MED ORDER — POTASSIUM CHLORIDE IN NACL 40-0.9 MEQ/L-% IV SOLN
INTRAVENOUS | Status: DC
  Filled 2023-02-23: qty 1000

## 2023-02-23 NOTE — Inpatient Diabetes Management (Signed)
 Inpatient Diabetes Program Recommendations  AACE/ADA: New Consensus Statement on Inpatient Glycemic Control (2025)  Target Ranges:  Prepandial:   less than 140 mg/dL      Peak postprandial:   less than 180 mg/dL (1-2 hours)      Critically ill patients:  140 - 180 mg/dL   Lab Results  Component Value Date   GLUCAP 210 (H) 02/23/2023   HGBA1C 5.9 (H) 11/18/2022    Review of Glycemic Control  Latest Reference Range & Units 02/22/23 22:30 02/22/23 23:07 02/23/23 00:15 02/23/23 01:57 02/23/23 05:33 02/23/23 08:22  Glucose-Capillary 70 - 99 mg/dL 58 (L) 66 (L) 783 (H) 820 (H) 160 (H) 210 (H)   Diabetes history: DM 1 Outpatient Diabetes medications:  Levemir  10 units bid, Novolog  with meals  Current orders for Inpatient glycemic control:  Novolog  0-15 units tid with meals and HS Semglee  10 units daily  Inpatient Diabetes Program Recommendations:    May consider reducing Novolog  to 0-6 units tid with meals and HS and add Novolog  meal coverage 2 units tid with meals (hold if patient eats less than 50% or NPO).    Thanks,  Randall Bullocks, RN, BC-ADM Inpatient Diabetes Coordinator Pager [phone]  (8a-5p)

## 2023-02-23 NOTE — Discharge Summary (Signed)
 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 F with recent bilateral AKA, DM, recurrent DKA, hx Cdiff, HTN, and gastroparesis who presented with intractable nausea and vomiting for few days.  In the ER, lipase normal.  Abdominal radiograph unremarkable.  She was unable to tolerate PO, so admitted on scheduled Reglan  for gastroparesis flare.      Diabetic gastroparesis (HCC) The patient was admitted with IV fluids, scheduled Reglan .  She had as needed Compazine  and Zofran , opiates were avoided and she gradually improved over 24 hours.  Tolerated an oral diet well today without vomiting since yesterday.  Continue home Reglan  PRN.  Small meals recommended.      CKD IIIa Cr stable at 1.5-1.6 here, despite IV fluids.  Suspect this is new baseline.   AKI ruled out.  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  Hypertensive urgency BP improved with home amlodipine  and Coreg .  S/P AKA (above knee amputation) bilateral (HCC)  Malnutrition of moderate degree As evidenced by moderate loss of subcutaneous muscle mass and fat            The [place]  Controlled Substances Registry was reviewed  for this patient prior to discharge.     Disposition: Home Diet recommendation:  Discharge Diet Orders (From admission, onward)     Start     Ordered   02/23/23 0000  Diet - Diabetic     02/23/23 1436             DISCHARGE MEDICATION: Allergies as of 02/23/2023       Reactions   Ibuprofen  Other (See Comments)   07/23/22 - Worsening kidney function after 3 doses of ibuprofen  400mg .  Creatinine increased from 1.3 to 2.  Creatinine returned to normal after stopping.   Lisinopril  Cough, Other (See Comments), Itching, Rash   Breakout in rash        Medication List     STOP taking these medications    furosemide  20 MG tablet Commonly known as: LASIX        TAKE these medications    acetaminophen  325 MG tablet Commonly known as: TYLENOL  Take 2 tablets (650 mg total) by mouth every 6 (six) hours as needed for mild pain, fever or headache.   amLODipine  10 MG tablet Commonly known as: NORVASC  Take 1 tablet (10 mg total) by mouth daily.   ascorbic acid  1000 MG tablet Commonly known as: VITAMIN C  Take 1 tablet (1,000 mg total) by mouth daily. What changed: when to take this   carvedilol  6.25 MG tablet Commonly known as: COREG  Take 1 tablet (6.25 mg total) by mouth 2 (two) times daily with a meal.   Dexcom G6 Transmitter Misc 1 Device  by Does not apply route as directed.   famotidine  20 MG tablet Commonly known as: PEPCID  Take 1 tablet (20 mg total) by mouth 2 (two) times daily.   feeding supplement (GLUCERNA SHAKE) Liqd Take 237 mLs by mouth 3 (three) times daily between meals.   nutrition supplement (JUVEN) Pack Take 1 packet by mouth 2 (two) times daily between meals.   gabapentin  300 MG capsule Commonly known as: NEURONTIN  Take 1 capsule (300 mg total) by mouth every 8 (eight) hours.   insulin  aspart 100 UNIT/ML injection Commonly known as: novoLOG  Inject 0-9 Units into the skin 3 (three) times daily with meals. What changed: how much to take    Insulin  Pen Needle 32G X 4 MM Misc 1 Device by Does not apply route in the morning, at noon, in the evening, and at bedtime.   Levemir  FlexTouch 100 UNIT/ML FlexTouch Pen Generic drug: insulin  detemir Inject 4 Units into the skin 2 (two) times daily. What changed: how much to take   MAGNESIUM  PO Take 1 tablet by mouth daily.   methocarbamol  500 MG tablet Commonly known as: ROBAXIN  Take 500 mg by mouth 3 (three) times daily as needed for muscle spasms.   metoCLOPramide  10 MG tablet Commonly known as: REGLAN  Take 1 tablet (10 mg total) by mouth every 8 (eight) hours as needed for nausea.   multivitamin with minerals Tabs tablet Take 1 tablet by mouth daily.   Nexplanon  68 MG Impl implant Generic drug: etonogestrel  1 each by Subdermal route continuous.   potassium chloride  SA 20 MEQ tablet Commonly known as: KLOR-CON  M Take 1 tablet (20 mEq total) by mouth daily for 5 days. What changed: when to take this   sucralfate  1 g tablet Commonly known as: CARAFATE  Take 1 g by mouth in the morning, at noon, and at bedtime.   traZODone  150 MG tablet Commonly known as: DESYREL  Take 150 mg by mouth at bedtime.        Follow-up Information     Arloa Jarvis, NP. Schedule an appointment as soon as possible for a visit in 1 week(s).   Specialty: Nurse Practitioner Contact information: [address] [place] [address] [phone]                 Discharge Instructions     Diet - low sodium heart healthy   Complete by: As directed    Discharge instructions   Complete by: As directed    I recommend you follow up with your primary care doctor in 1 week  Have them check your labs and kidney function.  Avoid taking Lasix /furosemide  for now until your doctor changes this plan  Avoid ibuprofen , Motrin , Advil , naproxen , Aleve  or other NSAID pain medicines as these can be toxic to the kidney  Take potassium for 1 week, then ask your primary doctor if you  should continue it   Increase activity slowly   Complete by: As directed        Discharge Exam: Filed Weights   02/23/23 1055  Weight: 65.3 kg    General: Pt is alert, awake, not in acute distress Cardiovascular: RRR, nl S1-S2, no murmurs appreciated.   No LE edema.   Respiratory: Normal respiratory rate and rhythm.  CTAB without rales or wheezes. Abdominal: Abdomen soft and non-tender.  No distension or HSM.   Neuro/Psych: Strength symmetric in upper and lower extremities.  Judgment and insight appear normal.   Condition at discharge: fair  The results  of significant diagnostics from this hospitalization (including imaging, microbiology, ancillary and laboratory) are listed below for reference.   Imaging Studies: DG ABD ACUTE 2+V W 1V CHEST Result Date: 02/21/2023 CLINICAL DATA:  Intractable nausea and vomiting. EXAM: Abdominal pain. COMPARISON:  Radiographs 01/21/2023.  Abdominopelvic CT 11/28/2022. FINDINGS: The heart size and mediastinal contours are stable. The lungs are clear. No pleural effusion or pneumothorax. There is a normal nonobstructive bowel gas pattern. Mildly prominent stool in the distal colon. No evidence of pneumoperitoneum or suspicious abdominal calcification. Left pelvic calcification corresponds with a phlebolith on prior CT. Left anterior abdominal wall generator and abandoned leads are unchanged. IMPRESSION: 1. No evidence of acute cardiopulmonary process. 2. No evidence of bowel obstruction or pneumoperitoneum. Mildly prominent stool in the distal colon. Electronically Signed   By: Elsie Perone M.D.   On: 02/21/2023 16:14    Microbiology: Results for orders placed or performed during the hospital encounter of 11/26/22  Culture, blood (routine x 2)     Status: Abnormal   Collection Time: 11/26/22 10:59 AM   Specimen: BLOOD LEFT HAND  Result Value Ref Range Status   Specimen Description   Final    BLOOD LEFT HAND Performed at [facility],  [address] [place], [place] [address]    Special Requests   Final    BOTTLES DRAWN AEROBIC ONLY Blood Culture adequate volume Performed at [facility], [address] [place], [place] [address]    Culture  Setup Time   Final    GRAM POSITIVE COCCI IN CLUSTERS AEROBIC BOTTLE ONLY CRITICAL RESULT CALLED TO, READ BACK BY AND VERIFIED WITH: COURTNEY EANES [phone], VIRAY,J Organism ID to follow    Culture (A)  Final    STAPHYLOCOCCUS HOMINIS THE SIGNIFICANCE OF ISOLATING THIS ORGANISM FROM A SINGLE SET OF BLOOD CULTURES WHEN MULTIPLE SETS ARE DRAWN IS UNCERTAIN. PLEASE NOTIFY THE MICROBIOLOGY DEPARTMENT WITHIN ONE WEEK IF SPECIATION AND SENSITIVITIES ARE REQUIRED. Performed at [facility], [address], [place], [place] [address]    Report Status 11/28/2022 FINAL  Final  Culture, blood (routine x 2)     Status: None   Collection Time: 11/26/22 10:59 AM   Specimen: BLOOD  Result Value Ref Range Status   Specimen Description BLOOD LT HAND  Final   Special Requests   Final    BOTTLES DRAWN AEROBIC AND ANAEROBIC Blood Culture adequate volume   Culture   Final    NO GROWTH 5 DAYS Performed at [facility], [address] [place], [place] [address]    Report Status 12/01/2022 FINAL  Final  Blood Culture ID Panel (Reflexed)     Status: Abnormal   Collection Time: 11/26/22 10:59 AM  Result Value Ref Range Status   Enterococcus faecalis NOT DETECTED NOT DETECTED Final   Enterococcus Faecium NOT DETECTED NOT DETECTED Final   Listeria monocytogenes NOT DETECTED NOT DETECTED Final   Staphylococcus species DETECTED (A) NOT DETECTED Final    Comment: CRITICAL RESULT CALLED TO, READ BACK BY AND VERIFIED WITH: L. POOLE PHARMD, AT 1057 11/27/22 D. VANHOOK    Staphylococcus aureus (BCID) NOT DETECTED NOT DETECTED Final   Staphylococcus epidermidis NOT DETECTED NOT DETECTED Final   Staphylococcus lugdunensis NOT DETECTED NOT DETECTED Final   Streptococcus species NOT DETECTED NOT  DETECTED Final   Streptococcus agalactiae NOT DETECTED NOT DETECTED Final   Streptococcus pneumoniae NOT DETECTED NOT DETECTED Final   Streptococcus pyogenes NOT DETECTED NOT DETECTED Final  A.calcoaceticus-baumannii NOT DETECTED NOT DETECTED Final   Bacteroides fragilis NOT DETECTED NOT DETECTED Final   Enterobacterales NOT DETECTED NOT DETECTED Final   Enterobacter cloacae complex NOT DETECTED NOT DETECTED Final   Escherichia coli NOT DETECTED NOT DETECTED Final   Klebsiella aerogenes NOT DETECTED NOT DETECTED Final   Klebsiella oxytoca NOT DETECTED NOT DETECTED Final   Klebsiella pneumoniae NOT DETECTED NOT DETECTED Final   Proteus species NOT DETECTED NOT DETECTED Final   Salmonella species NOT DETECTED NOT DETECTED Final   Serratia marcescens NOT DETECTED NOT DETECTED Final   Haemophilus influenzae NOT DETECTED NOT DETECTED Final   Neisseria meningitidis NOT DETECTED NOT DETECTED Final   Pseudomonas aeruginosa NOT DETECTED NOT DETECTED Final   Stenotrophomonas maltophilia NOT DETECTED NOT DETECTED Final   Candida albicans NOT DETECTED NOT DETECTED Final   Candida auris NOT DETECTED NOT DETECTED Final   Candida glabrata NOT DETECTED NOT DETECTED Final   Candida krusei NOT DETECTED NOT DETECTED Final   Candida parapsilosis NOT DETECTED NOT DETECTED Final   Candida tropicalis NOT DETECTED NOT DETECTED Final   Cryptococcus neoformans/gattii NOT DETECTED NOT DETECTED Final    Comment: Performed at [facility], [address] [place], [place] [address]  C Difficile Quick Screen w PCR reflex     Status: Abnormal   Collection Time: 11/26/22 11:01 AM   Specimen: STOOL  Result Value Ref Range Status   C Diff antigen POSITIVE (A) NEGATIVE Final   C Diff toxin NEGATIVE NEGATIVE Final   C Diff interpretation Results are indeterminate. See PCR results.  Final    Comment: Performed at [facility], [address], [place], [place] [address]  Gastrointestinal Panel by PCR , Stool      Status: None   Collection Time: 11/26/22 11:01 AM   Specimen: STOOL  Result Value Ref Range Status   Campylobacter species NOT DETECTED NOT DETECTED Final   Plesimonas shigelloides NOT DETECTED NOT DETECTED Final   Salmonella species NOT DETECTED NOT DETECTED Final   Yersinia enterocolitica NOT DETECTED NOT DETECTED Final   Vibrio species NOT DETECTED NOT DETECTED Final   Vibrio cholerae NOT DETECTED NOT DETECTED Final   Enteroaggregative E coli (EAEC) NOT DETECTED NOT DETECTED Final   Enteropathogenic E coli (EPEC) NOT DETECTED NOT DETECTED Final   Enterotoxigenic E coli (ETEC) NOT DETECTED NOT DETECTED Final   Shiga like toxin producing E coli (STEC) NOT DETECTED NOT DETECTED Final   Shigella/Enteroinvasive E coli (EIEC) NOT DETECTED NOT DETECTED Final   Cryptosporidium NOT DETECTED NOT DETECTED Final   Cyclospora cayetanensis NOT DETECTED NOT DETECTED Final   Entamoeba histolytica NOT DETECTED NOT DETECTED Final   Giardia lamblia NOT DETECTED NOT DETECTED Final   Adenovirus F40/41 NOT DETECTED NOT DETECTED Final   Astrovirus NOT DETECTED NOT DETECTED Final   Norovirus GI/GII NOT DETECTED NOT DETECTED Final   Rotavirus A NOT DETECTED NOT DETECTED Final   Sapovirus (I, II, IV, and V) NOT DETECTED NOT DETECTED Final    Comment: Performed at [facility], [address] [place], [place] [address]  C. Diff by PCR, Reflexed     Status: Abnormal   Collection Time: 11/26/22 11:01 AM  Result Value Ref Range Status   Toxigenic C. Difficile by PCR POSITIVE (A) NEGATIVE Final    Comment: Positive for toxigenic C. difficile with little to no toxin production. Only treat if clinical presentation suggests symptomatic illness. Performed at [facility], [address], [place],  [place] [address]   Culture, blood (Routine X 2) w Reflex to ID Panel     Status: None   Collection Time: 11/28/22  2:03 PM   Specimen: BLOOD LEFT ARM  Result Value Ref Range Status   Specimen  Description BLOOD LEFT ARM  Final   Special Requests   Final    BOTTLES DRAWN AEROBIC ONLY Blood Culture adequate volume   Culture   Final    NO GROWTH 5 DAYS Performed at [facility], [address] [place], [place] [address]    Report Status 12/03/2022 FINAL  Final  Culture, blood (Routine X 2) w Reflex to ID Panel     Status: None   Collection Time: 11/28/22  2:07 PM   Specimen: BLOOD LEFT ARM  Result Value Ref Range Status   Specimen Description BLOOD LEFT ARM  Final   Special Requests   Final    BOTTLES DRAWN AEROBIC ONLY Blood Culture results may not be optimal due to an excessive volume of blood received in culture bottles   Culture   Final    NO GROWTH 5 DAYS Performed at [facility], [address], [place], [place] [address]    Report Status 12/03/2022 FINAL  Final    Labs: CBC: Recent Labs  Lab 02/21/23 0830 02/22/23 0714 02/23/23 0614  WBC 9.0 16.6* 10.4  NEUTROABS 6.8  --   --   HGB 13.4 11.1* 10.7*  HCT 44.6 35.0* 34.2*  MCV 92.7 87.1 88.6  PLT 483* 464* 477*   Basic Metabolic Panel: Recent Labs  Lab 02/21/23 0830 02/22/23 0714 02/23/23 0614  NA 133* 139 140  K 3.9 3.1* 3.0*  CL 100 108 109  CO2 20* 20* 22  GLUCOSE 481* 147* 165*  BUN 24* 25* 17  CREATININE 1.56* 1.52* 1.60*  CALCIUM  9.1 8.7* 8.4*   Liver Function Tests: Recent Labs  Lab 02/21/23 0830  AST 27  ALT 33  ALKPHOS 161*  BILITOT 0.8  PROT 6.2*  ALBUMIN  1.8*   CBG: Recent Labs  Lab 02/23/23 0015 02/23/23 0157 02/23/23 0533 02/23/23 0822 02/23/23 1145  GLUCAP 216* 179* 160* 210* 122*    Discharge time spent: approximately 25 minutes spent on discharge counseling, evaluation of patient on day of discharge, and coordination of discharge planning with nursing, social work, pharmacy and case management  Signed: Lonni SHAUNNA Dalton, MD [facility] 02/23/2023

## 2023-02-26 ENCOUNTER — Other Ambulatory Visit: Payer: Self-pay

## 2023-02-26 ENCOUNTER — Emergency Department (HOSPITAL_COMMUNITY)
Admission: EM | Admit: 2023-02-26 | Discharge: 2023-02-27 | Disposition: A | Discharge status: Home / Self Care | Payer: Medicaid Other | Attending: Emergency Medicine | Admitting: Emergency Medicine

## 2023-02-26 DIAGNOSIS — E1122 Type 2 diabetes mellitus with diabetic chronic kidney disease: Secondary | ICD-10-CM | POA: Diagnosis not present

## 2023-02-26 DIAGNOSIS — Z79899 Other long term (current) drug therapy: Secondary | ICD-10-CM | POA: Diagnosis not present

## 2023-02-26 DIAGNOSIS — E876 Hypokalemia: Secondary | ICD-10-CM | POA: Insufficient documentation

## 2023-02-26 DIAGNOSIS — I129 Hypertensive chronic kidney disease with stage 1 through stage 4 chronic kidney disease, or unspecified chronic kidney disease: Secondary | ICD-10-CM | POA: Diagnosis not present

## 2023-02-26 DIAGNOSIS — Z794 Long term (current) use of insulin: Secondary | ICD-10-CM | POA: Insufficient documentation

## 2023-02-26 DIAGNOSIS — N189 Chronic kidney disease, unspecified: Secondary | ICD-10-CM | POA: Insufficient documentation

## 2023-02-26 DIAGNOSIS — R112 Nausea with vomiting, unspecified: Secondary | ICD-10-CM | POA: Insufficient documentation

## 2023-02-26 DIAGNOSIS — E1165 Type 2 diabetes mellitus with hyperglycemia: Secondary | ICD-10-CM | POA: Diagnosis not present

## 2023-02-26 LAB — CBG MONITORING, ED: Glucose-Capillary: 243 mg/dL — ABNORMAL HIGH (ref 70–99)

## 2023-02-26 MED ORDER — ONDANSETRON 4 MG PO TBDP
4.0000 mg | ORAL_TABLET | Freq: Once | ORAL | Status: AC
  Administered 2023-02-26: 4 mg via ORAL
  Filled 2023-02-26: qty 1

## 2023-02-26 NOTE — ED Provider Triage Note (Signed)
 Emergency Medicine Provider Triage Evaluation Note  Kelli Hudson , a 33 y.o. female  was evaluated in triage.  Pt complains of abd pain. Endorse diffused abd pain with nausea and vomiting for the past several hrs.  No fever, chills, body aches.  Hx of gastroparesis.  Review of Systems  Positive: As above Negative: As above  Physical Exam  BP (!) 171/116 (BP Location: Right Arm)   Pulse (!) 110   Temp 98.3 F (36.8 C) (Oral)   Resp 16   SpO2 100%  Gen:   Awake, no distress   Resp:  Normal effort  MSK:   Moves extremities without difficulty  Other:    Medical Decision Making  Medically screening exam initiated at 9:35 PM.  Appropriate orders placed.  Kelli Hudson was informed that the remainder of the evaluation will be completed by another provider, this initial triage assessment does not replace that evaluation, and the importance of remaining in the ED until their evaluation is complete.     Kelli Colon, PA-C 02/26/23 2136

## 2023-02-26 NOTE — ED Notes (Signed)
 Pt states her pain is worsening

## 2023-02-26 NOTE — ED Triage Notes (Signed)
 Pt reports that she has had generalized abdominal pain and vomiting for about 2 hours. Sugars have been high, and she is taking her inulin as prescribed.     Pt states that she needs an ultrasound and wants her labs drawn with initiation of her IV.

## 2023-02-26 NOTE — ED Triage Notes (Signed)
 Pt arrives via [facility]. was laying down, has had 2 hours of abdominal pain, nausea, vomiting. Generalized. Some abdominal distention. No diarrhea. Hx of gastroparesis. En route, 158/96, hr 116, 99% ra, cbg 304. Temp 98.5.

## 2023-02-26 NOTE — ED Notes (Signed)
 CBG 243

## 2023-02-27 LAB — COMPREHENSIVE METABOLIC PANEL
ALT: 14 U/L (ref 0–44)
AST: 14 U/L — ABNORMAL LOW (ref 15–41)
Albumin: 2 g/dL — ABNORMAL LOW (ref 3.5–5.0)
Alkaline Phosphatase: 132 U/L — ABNORMAL HIGH (ref 38–126)
Anion gap: 11 (ref 5–15)
BUN: 23 mg/dL — ABNORMAL HIGH (ref 6–20)
CO2: 20 mmol/L — ABNORMAL LOW (ref 22–32)
Calcium: 8.3 mg/dL — ABNORMAL LOW (ref 8.9–10.3)
Chloride: 100 mmol/L (ref 98–111)
Creatinine, Ser: 1.65 mg/dL — ABNORMAL HIGH (ref 0.44–1.00)
GFR, Estimated: 42 mL/min — ABNORMAL LOW (ref >60)
Glucose, Bld: 317 mg/dL — ABNORMAL HIGH (ref 70–99)
Potassium: 2.7 mmol/L — CL (ref 3.5–5.1)
Sodium: 131 mmol/L — ABNORMAL LOW (ref 135–145)
Total Bilirubin: 1 mg/dL (ref 0.0–1.2)
Total Protein: 6.2 g/dL — ABNORMAL LOW (ref 6.5–8.1)

## 2023-02-27 LAB — CBC
HCT: 39.5 % (ref 36.0–46.0)
Hemoglobin: 12.6 g/dL (ref 12.0–15.0)
MCH: 28.1 pg (ref 26.0–34.0)
MCHC: 31.9 g/dL (ref 30.0–36.0)
MCV: 88.2 fL (ref 80.0–100.0)
Platelets: 490 10*3/uL — ABNORMAL HIGH (ref 150–400)
RBC: 4.48 MIL/uL (ref 3.87–5.11)
RDW: 14.5 % (ref 11.5–15.5)
WBC: 9.4 10*3/uL (ref 4.0–10.5)
nRBC: 0 % (ref 0.0–0.2)

## 2023-02-27 LAB — MAGNESIUM: Magnesium: 1.9 mg/dL (ref 1.7–2.4)

## 2023-02-27 LAB — HCG, SERUM, QUALITATIVE: Preg, Serum: NEGATIVE

## 2023-02-27 LAB — LIPASE, BLOOD: Lipase: 20 U/L (ref 11–51)

## 2023-02-27 MED ORDER — METOCLOPRAMIDE HCL 5 MG/ML IJ SOLN
10.0000 mg | Freq: Once | INTRAMUSCULAR | Status: AC
  Administered 2023-02-27: 10 mg via INTRAVENOUS
  Filled 2023-02-27: qty 2

## 2023-02-27 MED ORDER — SODIUM CHLORIDE 0.9 % IV BOLUS (SEPSIS)
1000.0000 mL | Freq: Once | INTRAVENOUS | Status: AC
  Administered 2023-02-27: 1000 mL via INTRAVENOUS

## 2023-02-27 MED ORDER — DIPHENHYDRAMINE HCL 50 MG/ML IJ SOLN
25.0000 mg | Freq: Once | INTRAMUSCULAR | Status: AC
  Administered 2023-02-27: 25 mg via INTRAVENOUS
  Filled 2023-02-27: qty 1

## 2023-02-27 MED ORDER — POTASSIUM CHLORIDE 10 MEQ/100ML IV SOLN
10.0000 meq | INTRAVENOUS | Status: AC
  Administered 2023-02-27 (×3): 10 meq via INTRAVENOUS
  Filled 2023-02-27 (×3): qty 100

## 2023-02-27 MED ORDER — PROCHLORPERAZINE EDISYLATE 10 MG/2ML IJ SOLN
10.0000 mg | Freq: Once | INTRAMUSCULAR | Status: AC
Start: 2023-02-27 — End: 2023-02-27
  Administered 2023-02-27: 10 mg via INTRAVENOUS
  Filled 2023-02-27: qty 2

## 2023-02-27 NOTE — ED Provider Notes (Signed)
 [place] EMERGENCY DEPARTMENT AT [facility] Provider Note   CSN: [id number] Arrival date & time: 02/26/23  2110     History  Chief complaint-abdominal pain and vomiting   Kelli Hudson is a 33 y.o. female.  The history is provided by the patient.  Patient w/history of diabetes, diabetic gastroparesis, hypertension, bilateral AKA presents with abdominal pain and vomiting Patient reports multiple episodes of emesis over the past several hours.  No diarrhea.  Patient has long history of diabetic gastroparesis    Past Medical History:  Diagnosis Date   Acute H. pylori gastric ulcer    Coffee ground emesis    Diabetes mellitus (HCC)    Diabetic gastroparesis (HCC)    DKA (diabetic ketoacidosis) (HCC) 02/24/2021   Gastroparesis    GERD (gastroesophageal reflux disease)    Hypertension    Hyperthyroidism    Intractable nausea and vomiting 04/20/2021   Normocytic anemia 04/20/2020   Prolonged Q-T interval on ECG     Home Medications Prior to Admission medications   Medication Sig Start Date End Date Taking? Authorizing Provider  acetaminophen  (TYLENOL ) 325 MG tablet Take 2 tablets (650 mg total) by mouth every 6 (six) hours as needed for mild pain, fever or headache. 11/22/22   Lue Elsie BROCKS, MD  amLODipine  (NORVASC ) 10 MG tablet Take 1 tablet (10 mg total) by mouth daily. 12/04/22   Elgergawy, Brayton RAMAN, MD  ascorbic acid  (VITAMIN C ) 1000 MG tablet Take 1 tablet (1,000 mg total) by mouth daily. Patient taking differently: Take 1,000 mg by mouth once a week. 11/22/22   Lue Elsie BROCKS, MD  carvedilol  (COREG ) 6.25 MG tablet Take 1 tablet (6.25 mg total) by mouth 2 (two) times daily with a meal. 12/03/22   Elgergawy, Brayton RAMAN, MD  Continuous Glucose Transmitter (DEXCOM G6 TRANSMITTER) MISC 1 Device by Does not apply route as directed. 06/18/22   Shamleffer, Ibtehal Jaralla, MD  etonogestrel  (NEXPLANON ) 68 MG IMPL implant 1 each by Subdermal route  continuous.    [provider]  famotidine  (PEPCID ) 20 MG tablet Take 1 tablet (20 mg total) by mouth 2 (two) times daily. 12/03/22   Elgergawy, Brayton RAMAN, MD  feeding supplement, GLUCERNA SHAKE, (GLUCERNA SHAKE) LIQD Take 237 mLs by mouth 3 (three) times daily between meals. 11/22/22   Lue Elsie BROCKS, MD  gabapentin  (NEURONTIN ) 300 MG capsule Take 1 capsule (300 mg total) by mouth every 8 (eight) hours. 11/22/22   Lue Elsie BROCKS, MD  insulin  aspart (NOVOLOG ) 100 UNIT/ML injection Inject 0-9 Units into the skin 3 (three) times daily with meals. Patient taking differently: Inject 10-30 Units into the skin 3 (three) times daily with meals. 12/03/22   Elgergawy, Brayton RAMAN, MD  insulin  detemir (LEVEMIR  FLEXTOUCH) 100 UNIT/ML FlexPen Inject 4 Units into the skin 2 (two) times daily. Patient taking differently: Inject 10 Units into the skin 2 (two) times daily. 11/22/22   Lue Elsie BROCKS, MD  Insulin  Pen Needle 32G X 4 MM MISC 1 Device by Does not apply route in the morning, at noon, in the evening, and at bedtime. 06/18/22   Shamleffer, Donell Cardinal, MD  MAGNESIUM  PO Take 1 tablet by mouth daily.    [provider]  methocarbamol  (ROBAXIN ) 500 MG tablet Take 500 mg by mouth 3 (three) times daily as needed for muscle spasms. 11/22/22   [provider]  metoCLOPramide  (REGLAN ) 10 MG tablet Take 1 tablet (10 mg total) by mouth every 8 (eight) hours as needed  for nausea. 01/23/23   Adhikari, Amrit, MD  Multiple Vitamin (MULTIVITAMIN WITH MINERALS) TABS tablet Take 1 tablet by mouth daily. 11/22/22   Lue Elsie BROCKS, MD  nutrition supplement, JUVEN, RAWLEIGH) PACK Take 1 packet by mouth 2 (two) times daily between meals. 11/22/22   Lue Elsie BROCKS, MD  potassium chloride  SA (KLOR-CON  M) 20 MEQ tablet Take 1 tablet (20 mEq total) by mouth daily for 5 days. 02/23/23 02/28/23  Danford, Lonni SQUIBB, MD  sucralfate  (CARAFATE ) 1 g tablet Take 1 g by mouth in the morning, at  noon, and at bedtime. 08/28/22   [provider]  traZODone  (DESYREL ) 150 MG tablet Take 150 mg by mouth at bedtime. 01/14/23   [provider]      Allergies    Ibuprofen  and Lisinopril     Review of Systems   Review of Systems  Constitutional:  Negative for fever.  Cardiovascular:  Negative for chest pain.  Gastrointestinal:  Positive for abdominal pain, nausea and vomiting. Negative for diarrhea.    Physical Exam Updated Vital Signs BP (!) 179/119   Pulse (!) 116   Temp 98.5 F (36.9 C) (Oral)   Resp 20   SpO2 97%  Physical Exam CONSTITUTIONAL: Chronically ill-appearing HEAD: Normocephalic/atraumatic EYES: EOMI/PERRL ENMT: Mucous membranes dry NECK: supple no meningeal signs CV: S1/S2 noted, tachycardic LUNGS: Lungs are clear to auscultation bilaterally, no apparent distress ABDOMEN: soft, mild diffuse tenderness NEURO: Pt is awake/alert/appropriate, moves all extremitiesx4.  No facial droop.   EXTREMITIES: Bilateral AKA SKIN: warm, color normal PSYCH: Anxious  ED Results / Procedures / Treatments   Labs (all labs ordered are listed, but only abnormal results are displayed) Labs Reviewed  COMPREHENSIVE METABOLIC PANEL - Abnormal; Notable for the following components:      Result Value   Sodium 131 (*)    Potassium 2.7 (*)    CO2 20 (*)    Glucose, Bld 317 (*)    BUN 23 (*)    Creatinine, Ser 1.65 (*)    Calcium  8.3 (*)    Total Protein 6.2 (*)    Albumin  2.0 (*)    AST 14 (*)    Alkaline Phosphatase 132 (*)    GFR, Estimated 42 (*)    All other components within normal limits  CBC - Abnormal; Notable for the following components:   Platelets 490 (*)    All other components within normal limits  CBG MONITORING, ED - Abnormal; Notable for the following components:   Glucose-Capillary 243 (*)    All other components within normal limits  LIPASE, BLOOD  HCG, SERUM, QUALITATIVE  MAGNESIUM     EKG EKG Interpretation Date/Time:  Friday  February 27 2023 01:21:27 EST Ventricular Rate:  115 PR Interval:  156 QRS Duration:  83 QT Interval:  348 QTC Calculation: 482 R Axis:   58  Text Interpretation: Sinus tachycardia Nonspecific T abnormalities, diffuse leads Borderline prolonged QT interval Confirmed by Midge Golas ([id number]) on 02/27/2023 1:24:55 AM  Radiology No results found.  Procedures Procedures    Medications Ordered in ED Medications  ondansetron  (ZOFRAN -ODT) disintegrating tablet 4 mg (4 mg Oral Given 02/26/23 2138)  sodium chloride  0.9 % bolus 1,000 mL (0 mLs Intravenous Stopped 02/27/23 0342)  prochlorperazine  (COMPAZINE ) injection 10 mg (10 mg Intravenous Given 02/27/23 0124)  potassium chloride  10 mEq in 100 mL IVPB (10 mEq Intravenous New Bag/Given 02/27/23 0350)  metoCLOPramide  (REGLAN ) injection 10 mg (10 mg Intravenous Given 02/27/23 0259)  diphenhydrAMINE  (BENADRYL ) injection 25  mg (25 mg Intravenous Given 02/27/23 0300)    ED Course/ Medical Decision Making/ A&P Clinical Course as of 02/27/23 0455  Fri Feb 27, 2023  0111 Patient presents for recurrent abdominal pain and vomiting, with history of diabetes and diabetic gastroparesis. Patient multiple ER visits previously.  Patient immediately began to negotiate medications including Dilaudid  upon my arrival to the room Given that narcotics are not a long-term solution to frequent episodes of gastroparesis, we will focus on nausea control [DW]  0151 Potassium(!!): 2.7 Hypokalemia noted, will treat [DW]  0151 Glucose(!): 317 Patient with hyperglycemia without anion gap [DW]  0247 Besides hypokalemia, labs near baseline. Nursing reports that patient keeps sticking her finger down her throat to trigger vomiting Will continue to treat nausea, and hypokalemia [DW]  0343 Patient resting comfortably, receiving medications [DW]  0454 Patient monitored for several hours and appears to be improved.  She has received multiple rounds of potassium.  No new  vomiting. On my reassessment she is resting comfortably. I suspect this was a flareup of her underlying gastroparesis. Patient be discharged home with follow-up with her PCP I continue to reiterate the need to avoid narcotics for her recurrent pain/gastroparesis [DW]    Clinical Course User Index [DW] Midge Golas, MD                                 Medical Decision Making Amount and/or Complexity of Data Reviewed Labs: ordered. Decision-making details documented in ED Course. ECG/medicine tests: ordered.  Risk Prescription drug management.   This patient presents to the ED for concern of abdominal pain and vomiting, this involves an extensive number of treatment options, and is a complaint that carries with it a high risk of complications and morbidity.  The differential diagnosis includes but is not limited to cholecystitis, cholelithiasis, pancreatitis, gastritis, peptic ulcer disease, appendicitis, bowel obstruction, bowel perforation, diverticulitis, ectopic pregnancy, PID, TOA    Comorbidities that complicate the patient evaluation: Patient's presentation is complicated by their history of diabetes  Social Determinants of Health: Patient's  multiple ER visit   increases the complexity of managing their presentation  Additional history obtained: Records reviewed previous admission documents  Lab Tests: I Ordered, and personally interpreted labs.  The pertinent results include: hyperGlycemia, chronic renal insufficiency   Cardiac Monitoring: The patient was maintained on a cardiac monitor.  I personally viewed and interpreted the cardiac monitor which showed an underlying rhythm of:  sinus tachycardia  Medicines ordered and prescription drug management: I ordered medication including Reglan  and Benadryl  for vomiting Reevaluation of the patient after these medicines showed that the patient    improved  Critical Interventions:   IV  potassium   Reevaluation: After the interventions noted above, I reevaluated the patient and found that they have :improved  Complexity of problems addressed: Patient's presentation is most consistent with  severe exacerbation of chronic illness  Disposition: After consideration of the diagnostic results and the patient's response to treatment,  I feel that the patent would benefit from discharge   .           Final Clinical Impression(s) / ED Diagnoses Final diagnoses:  Hypokalemia  Nausea and vomiting, unspecified vomiting type    Rx / DC Orders ED Discharge Orders     None         Midge Golas, MD 02/27/23 [phone]

## 2023-02-27 NOTE — ED Notes (Signed)
 Patient stated She wants IV Team to start her line.

## 2023-03-01 ENCOUNTER — Inpatient Hospital Stay (HOSPITAL_COMMUNITY)
Admission: EM | Admit: 2023-03-01 | Discharge: 2023-03-07 | DRG: 872 | Disposition: A | Discharge status: Home / Self Care | Payer: 59 | Attending: Internal Medicine | Admitting: Internal Medicine

## 2023-03-01 ENCOUNTER — Encounter (HOSPITAL_COMMUNITY): Payer: Self-pay | Admitting: Emergency Medicine

## 2023-03-01 DIAGNOSIS — Z833 Family history of diabetes mellitus: Secondary | ICD-10-CM

## 2023-03-01 DIAGNOSIS — Z89512 Acquired absence of left leg below knee: Secondary | ICD-10-CM

## 2023-03-01 DIAGNOSIS — N179 Acute kidney failure, unspecified: Principal | ICD-10-CM | POA: Diagnosis present

## 2023-03-01 DIAGNOSIS — R55 Syncope and collapse: Secondary | ICD-10-CM | POA: Diagnosis present

## 2023-03-01 DIAGNOSIS — Z794 Long term (current) use of insulin: Secondary | ICD-10-CM

## 2023-03-01 DIAGNOSIS — I10 Essential (primary) hypertension: Secondary | ICD-10-CM | POA: Diagnosis present

## 2023-03-01 DIAGNOSIS — R42 Dizziness and giddiness: Secondary | ICD-10-CM | POA: Diagnosis not present

## 2023-03-01 DIAGNOSIS — K3184 Gastroparesis: Secondary | ICD-10-CM | POA: Diagnosis present

## 2023-03-01 DIAGNOSIS — E876 Hypokalemia: Secondary | ICD-10-CM | POA: Diagnosis present

## 2023-03-01 DIAGNOSIS — E872 Acidosis, unspecified: Secondary | ICD-10-CM | POA: Diagnosis present

## 2023-03-01 DIAGNOSIS — N1832 Chronic kidney disease, stage 3b: Secondary | ICD-10-CM | POA: Diagnosis present

## 2023-03-01 DIAGNOSIS — A4189 Other specified sepsis: Principal | ICD-10-CM | POA: Diagnosis present

## 2023-03-01 DIAGNOSIS — Z886 Allergy status to analgesic agent status: Secondary | ICD-10-CM

## 2023-03-01 DIAGNOSIS — I129 Hypertensive chronic kidney disease with stage 1 through stage 4 chronic kidney disease, or unspecified chronic kidney disease: Secondary | ICD-10-CM | POA: Diagnosis present

## 2023-03-01 DIAGNOSIS — D638 Anemia in other chronic diseases classified elsewhere: Secondary | ICD-10-CM | POA: Diagnosis present

## 2023-03-01 DIAGNOSIS — K219 Gastro-esophageal reflux disease without esophagitis: Secondary | ICD-10-CM | POA: Diagnosis present

## 2023-03-01 DIAGNOSIS — R6510 Systemic inflammatory response syndrome (SIRS) of non-infectious origin without acute organ dysfunction: Secondary | ICD-10-CM | POA: Diagnosis present

## 2023-03-01 DIAGNOSIS — Z950 Presence of cardiac pacemaker: Secondary | ICD-10-CM

## 2023-03-01 DIAGNOSIS — A084 Viral intestinal infection, unspecified: Secondary | ICD-10-CM | POA: Diagnosis present

## 2023-03-01 DIAGNOSIS — E1142 Type 2 diabetes mellitus with diabetic polyneuropathy: Secondary | ICD-10-CM | POA: Diagnosis present

## 2023-03-01 DIAGNOSIS — R509 Fever, unspecified: Secondary | ICD-10-CM | POA: Diagnosis present

## 2023-03-01 DIAGNOSIS — Z79899 Other long term (current) drug therapy: Secondary | ICD-10-CM

## 2023-03-01 DIAGNOSIS — D631 Anemia in chronic kidney disease: Secondary | ICD-10-CM | POA: Diagnosis present

## 2023-03-01 DIAGNOSIS — E119 Type 2 diabetes mellitus without complications: Secondary | ICD-10-CM

## 2023-03-01 DIAGNOSIS — Z89511 Acquired absence of right leg below knee: Secondary | ICD-10-CM

## 2023-03-01 DIAGNOSIS — R112 Nausea with vomiting, unspecified: Secondary | ICD-10-CM | POA: Diagnosis present

## 2023-03-01 DIAGNOSIS — Z888 Allergy status to other drugs, medicaments and biological substances status: Secondary | ICD-10-CM

## 2023-03-01 DIAGNOSIS — E860 Dehydration: Secondary | ICD-10-CM | POA: Diagnosis present

## 2023-03-01 DIAGNOSIS — E1143 Type 2 diabetes mellitus with diabetic autonomic (poly)neuropathy: Secondary | ICD-10-CM | POA: Diagnosis present

## 2023-03-01 DIAGNOSIS — E1065 Type 1 diabetes mellitus with hyperglycemia: Secondary | ICD-10-CM

## 2023-03-01 DIAGNOSIS — Z89612 Acquired absence of left leg above knee: Secondary | ICD-10-CM

## 2023-03-01 DIAGNOSIS — E1122 Type 2 diabetes mellitus with diabetic chronic kidney disease: Secondary | ICD-10-CM | POA: Diagnosis present

## 2023-03-01 DIAGNOSIS — Z89611 Acquired absence of right leg above knee: Secondary | ICD-10-CM

## 2023-03-01 LAB — CBC
HCT: 33 % — ABNORMAL LOW (ref 36.0–46.0)
Hemoglobin: 10.3 g/dL — ABNORMAL LOW (ref 12.0–15.0)
MCH: 28.8 pg (ref 26.0–34.0)
MCHC: 31.2 g/dL (ref 30.0–36.0)
MCV: 92.2 fL (ref 80.0–100.0)
Platelets: 339 10*3/uL (ref 150–400)
RBC: 3.58 MIL/uL — ABNORMAL LOW (ref 3.87–5.11)
RDW: 15.4 % (ref 11.5–15.5)
WBC: 12.1 10*3/uL — ABNORMAL HIGH (ref 4.0–10.5)
nRBC: 0 % (ref 0.0–0.2)

## 2023-03-01 LAB — CBG MONITORING, ED: Glucose-Capillary: 152 mg/dL — ABNORMAL HIGH (ref 70–99)

## 2023-03-01 LAB — LACTIC ACID, PLASMA: Lactic Acid, Venous: 1.2 mmol/L (ref 0.5–1.9)

## 2023-03-01 LAB — HCG, SERUM, QUALITATIVE: Preg, Serum: NEGATIVE

## 2023-03-01 MED ORDER — SODIUM CHLORIDE 0.9 % IV BOLUS
1000.0000 mL | Freq: Once | INTRAVENOUS | Status: AC
  Administered 2023-03-01: 1000 mL via INTRAVENOUS

## 2023-03-01 MED ORDER — DIPHENHYDRAMINE HCL 50 MG/ML IJ SOLN
25.0000 mg | Freq: Once | INTRAMUSCULAR | Status: AC
  Administered 2023-03-01: 25 mg via INTRAMUSCULAR
  Filled 2023-03-01 (×2): qty 1

## 2023-03-01 MED ORDER — PROCHLORPERAZINE EDISYLATE 10 MG/2ML IJ SOLN
10.0000 mg | Freq: Once | INTRAMUSCULAR | Status: AC
  Administered 2023-03-01: 10 mg via INTRAMUSCULAR
  Filled 2023-03-01: qty 2

## 2023-03-01 MED ORDER — DIPHENHYDRAMINE HCL 50 MG/ML IJ SOLN: 12.5000 mg | Freq: Once | INTRAMUSCULAR | Status: DC

## 2023-03-01 MED ORDER — PROCHLORPERAZINE EDISYLATE 10 MG/2ML IJ SOLN: 10.0000 mg | Freq: Once | INTRAMUSCULAR | Status: DC

## 2023-03-01 NOTE — ED Notes (Signed)
 This RN attempted to place IV and collect blood work x2 with no success. IV team consult is placed.

## 2023-03-01 NOTE — ED Provider Notes (Signed)
 [place] EMERGENCY DEPARTMENT AT [facility] Provider Note   CSN: [id number] Arrival date & time: 03/01/23  1836     History  Chief Complaint  Patient presents with   Near Syncope    Kelli Hudson is a 33 y.o. female.  33 year old female with past medical history of diabetes and gastroparesis presenting to the emergency department today with lightheadedness as well as nausea, vomiting, and diarrhea.  The patient states that she has been having nausea, vomiting, and diarrhea since this morning.  She reports multiple episodes of nonbloody, nonbilious emesis.  She denies any hematemesis.  She denies any associated chest pain or cough.  She came to the emergency department today due to feeling generally weak and with the ongoing nausea, vomiting, and or diarrhea.  She denies any abdominal pain with this.  She denies any fevers.   Near Syncope       Home Medications Prior to Admission medications   Medication Sig Start Date End Date Taking? Authorizing Provider  acetaminophen  (TYLENOL ) 325 MG tablet Take 2 tablets (650 mg total) by mouth every 6 (six) hours as needed for mild pain, fever or headache. 11/22/22   Lue Elsie BROCKS, MD  amLODipine  (NORVASC ) 10 MG tablet Take 1 tablet (10 mg total) by mouth daily. 12/04/22   Elgergawy, Brayton RAMAN, MD  ascorbic acid  (VITAMIN C ) 1000 MG tablet Take 1 tablet (1,000 mg total) by mouth daily. Patient taking differently: Take 1,000 mg by mouth once a week. 11/22/22   Lue Elsie BROCKS, MD  carvedilol  (COREG ) 6.25 MG tablet Take 1 tablet (6.25 mg total) by mouth 2 (two) times daily with a meal. 12/03/22   Elgergawy, Brayton RAMAN, MD  Continuous Glucose Transmitter (DEXCOM G6 TRANSMITTER) MISC 1 Device by Does not apply route as directed. 06/18/22   Shamleffer, Ibtehal Jaralla, MD  etonogestrel  (NEXPLANON ) 68 MG IMPL implant 1 each by Subdermal route continuous.    [provider]  famotidine  (PEPCID ) 20 MG tablet Take 1  tablet (20 mg total) by mouth 2 (two) times daily. 12/03/22   Elgergawy, Brayton RAMAN, MD  feeding supplement, GLUCERNA SHAKE, (GLUCERNA SHAKE) LIQD Take 237 mLs by mouth 3 (three) times daily between meals. 11/22/22   Lue Elsie BROCKS, MD  gabapentin  (NEURONTIN ) 300 MG capsule Take 1 capsule (300 mg total) by mouth every 8 (eight) hours. 11/22/22   Lue Elsie BROCKS, MD  insulin  aspart (NOVOLOG ) 100 UNIT/ML injection Inject 0-9 Units into the skin 3 (three) times daily with meals. Patient taking differently: Inject 10-30 Units into the skin 3 (three) times daily with meals. 12/03/22   Elgergawy, Brayton RAMAN, MD  insulin  detemir (LEVEMIR  FLEXTOUCH) 100 UNIT/ML FlexPen Inject 4 Units into the skin 2 (two) times daily. Patient taking differently: Inject 10 Units into the skin 2 (two) times daily. 11/22/22   Lue Elsie BROCKS, MD  Insulin  Pen Needle 32G X 4 MM MISC 1 Device by Does not apply route in the morning, at noon, in the evening, and at bedtime. 06/18/22   Shamleffer, Donell Cardinal, MD  MAGNESIUM  PO Take 1 tablet by mouth daily.    [provider]  methocarbamol  (ROBAXIN ) 500 MG tablet Take 500 mg by mouth 3 (three) times daily as needed for muscle spasms. 11/22/22   [provider]  metoCLOPramide  (REGLAN ) 10 MG tablet Take 1 tablet (10 mg total) by mouth every 8 (eight) hours as needed for nausea. 01/23/23   Adhikari, Amrit, MD  Multiple Vitamin (MULTIVITAMIN WITH MINERALS)  TABS tablet Take 1 tablet by mouth daily. 11/22/22   Lue Elsie BROCKS, MD  nutrition supplement, JUVEN, RAWLEIGH) PACK Take 1 packet by mouth 2 (two) times daily between meals. 11/22/22   Lue Elsie BROCKS, MD  potassium chloride  SA (KLOR-CON  M) 20 MEQ tablet Take 1 tablet (20 mEq total) by mouth daily for 5 days. 02/23/23 02/28/23  Danford, Lonni SQUIBB, MD  sucralfate  (CARAFATE ) 1 g tablet Take 1 g by mouth in the morning, at noon, and at bedtime. 08/28/22   [provider]  traZODone  (DESYREL ) 150  MG tablet Take 150 mg by mouth at bedtime. 01/14/23   [provider]      Allergies    Ibuprofen  and Lisinopril     Review of Systems   Review of Systems  Cardiovascular:  Positive for near-syncope.  Gastrointestinal:  Positive for diarrhea, nausea and vomiting.  Neurological:  Positive for light-headedness.    Physical Exam Updated Vital Signs BP 94/66   Pulse (!) 108   Temp 99.3 F (37.4 C) (Oral)   Resp 17   SpO2 97%  Physical Exam Vitals and nursing note reviewed.   Gen: Chronically ill-appearing, no acute distress Eyes: PERRL, EOMI HEENT: no oropharyngeal swelling Neck: trachea midline Resp: clear to auscultation bilaterally Card: RRR, no murmurs, rubs, or gallops Abd: nontender, nondistended Extremities: Bilateral AKA is noted Neuro: No focal deficits Skin: no rashes Psyc: acting appropriately   ED Results / Procedures / Treatments   Labs (all labs ordered are listed, but only abnormal results are displayed) Labs Reviewed  CBG MONITORING, ED - Abnormal; Notable for the following components:      Result Value   Glucose-Capillary 152 (*)    All other components within normal limits  RESP PANEL BY RT-PCR (RSV, FLU A&B, COVID)  RVPGX2  BASIC METABOLIC PANEL  CBC  URINALYSIS, ROUTINE W REFLEX MICROSCOPIC  HCG, SERUM, QUALITATIVE  LACTIC ACID, PLASMA  LACTIC ACID, PLASMA  MAGNESIUM     EKG None  Radiology No results found.  Procedures Procedures    Medications Ordered in ED Medications  sodium chloride  0.9 % bolus 1,000 mL (has no administration in time range)  prochlorperazine  (COMPAZINE ) injection 10 mg (has no administration in time range)  diphenhydrAMINE  (BENADRYL ) injection 25 mg (has no administration in time range)    ED Course/ Medical Decision Making/ A&P                                 Medical Decision Making 33 year old female with past medical history of diabetes and gastroparesis presenting to the emergency department  today with nausea, vomiting, and diarrhea as well as lightheadedness.  I will further evaluate the patient here with basic labs to evaluate for recurrent electrolyte abnormalities.  It does appear that her potassium was low few days ago.  If this was much lower this could certainly be causing some of her symptoms.  Also obtain a lactic acid to screen for sepsis as her blood pressures were low compared to her baseline on arrival.  Will keep her on the cardiac monitor.  I will give the patient IV fluids in addition to Compazine  and Benadryl  for her symptoms.  This may be due to gastroenteritis or gastroparesis flare.  I will reevaluate for ultimate disposition.  The patient's EKG interpreted by me shows a sinus rhythm with a rate of 104 with normal axis, normal intervals, and nonspecific ST-T changes.  The  patient's glucose is unremarkable.  Labs are pending at time of signout.  Amount and/or Complexity of Data Reviewed Labs: ordered.  Risk Prescription drug management.           Final Clinical Impression(s) / ED Diagnoses Final diagnoses:  Lightheadedness    Rx / DC Orders ED Discharge Orders     None         Ula Prentice SAUNDERS, MD 03/01/23 2313

## 2023-03-01 NOTE — ED Notes (Signed)
 Delay on blood draw PT requesting to use restroom first. Triage staff made aware

## 2023-03-01 NOTE — ED Triage Notes (Signed)
 Pt here from home with c/o near syncopal episode cbg 177 , pt alert and oriented on arrival , with c/o headache

## 2023-03-01 NOTE — ED Provider Notes (Signed)
 Care assumed at 2330.  Patient with history of diabetes here for evaluation of near syncopal/syncopal episode in the setting of vomiting and diarrhea.  No hematochezia, no hematemesis.  Care assumed pending labs and reevaluation.   Labs with mild leukocytosis.  BMP with AKI when compared to priors.  Bicarb is low but there is no elevation in anion gap.  She was treated with IV fluids, pain medications.  She did develop a fever to 102.5 oral during her ED stay.  Will treat with acetaminophen .  Plan to admit for AKI in setting of vomiting and diarrhea.  Patient updated patient of studies and she is in agreement with admission.   Griselda Norris, MD 03/02/23 [phone]

## 2023-03-02 ENCOUNTER — Other Ambulatory Visit: Payer: Self-pay

## 2023-03-02 ENCOUNTER — Encounter (HOSPITAL_COMMUNITY): Payer: Self-pay | Admitting: Internal Medicine

## 2023-03-02 ENCOUNTER — Inpatient Hospital Stay (HOSPITAL_COMMUNITY): Payer: 59

## 2023-03-02 DIAGNOSIS — Z950 Presence of cardiac pacemaker: Secondary | ICD-10-CM | POA: Diagnosis not present

## 2023-03-02 DIAGNOSIS — E872 Acidosis, unspecified: Secondary | ICD-10-CM | POA: Diagnosis present

## 2023-03-02 DIAGNOSIS — I10 Essential (primary) hypertension: Secondary | ICD-10-CM

## 2023-03-02 DIAGNOSIS — R6510 Systemic inflammatory response syndrome (SIRS) of non-infectious origin without acute organ dysfunction: Secondary | ICD-10-CM | POA: Diagnosis present

## 2023-03-02 DIAGNOSIS — N1832 Chronic kidney disease, stage 3b: Secondary | ICD-10-CM

## 2023-03-02 DIAGNOSIS — R509 Fever, unspecified: Secondary | ICD-10-CM | POA: Diagnosis present

## 2023-03-02 DIAGNOSIS — Z89512 Acquired absence of left leg below knee: Secondary | ICD-10-CM | POA: Diagnosis not present

## 2023-03-02 DIAGNOSIS — D638 Anemia in other chronic diseases classified elsewhere: Secondary | ICD-10-CM | POA: Diagnosis not present

## 2023-03-02 DIAGNOSIS — D631 Anemia in chronic kidney disease: Secondary | ICD-10-CM | POA: Diagnosis present

## 2023-03-02 DIAGNOSIS — Z794 Long term (current) use of insulin: Secondary | ICD-10-CM | POA: Diagnosis not present

## 2023-03-02 DIAGNOSIS — R197 Diarrhea, unspecified: Secondary | ICD-10-CM | POA: Diagnosis not present

## 2023-03-02 DIAGNOSIS — E876 Hypokalemia: Secondary | ICD-10-CM | POA: Diagnosis present

## 2023-03-02 DIAGNOSIS — Z79899 Other long term (current) drug therapy: Secondary | ICD-10-CM | POA: Diagnosis not present

## 2023-03-02 DIAGNOSIS — E860 Dehydration: Secondary | ICD-10-CM | POA: Diagnosis present

## 2023-03-02 DIAGNOSIS — R42 Dizziness and giddiness: Secondary | ICD-10-CM | POA: Diagnosis present

## 2023-03-02 DIAGNOSIS — N179 Acute kidney failure, unspecified: Secondary | ICD-10-CM

## 2023-03-02 DIAGNOSIS — A084 Viral intestinal infection, unspecified: Secondary | ICD-10-CM | POA: Diagnosis present

## 2023-03-02 DIAGNOSIS — K3184 Gastroparesis: Secondary | ICD-10-CM | POA: Diagnosis present

## 2023-03-02 DIAGNOSIS — Z89611 Acquired absence of right leg above knee: Secondary | ICD-10-CM | POA: Diagnosis not present

## 2023-03-02 DIAGNOSIS — R112 Nausea with vomiting, unspecified: Secondary | ICD-10-CM

## 2023-03-02 DIAGNOSIS — E1122 Type 2 diabetes mellitus with diabetic chronic kidney disease: Secondary | ICD-10-CM | POA: Diagnosis present

## 2023-03-02 DIAGNOSIS — Z89511 Acquired absence of right leg below knee: Secondary | ICD-10-CM | POA: Diagnosis not present

## 2023-03-02 DIAGNOSIS — E1142 Type 2 diabetes mellitus with diabetic polyneuropathy: Secondary | ICD-10-CM

## 2023-03-02 DIAGNOSIS — K219 Gastro-esophageal reflux disease without esophagitis: Secondary | ICD-10-CM

## 2023-03-02 DIAGNOSIS — Z89612 Acquired absence of left leg above knee: Secondary | ICD-10-CM | POA: Diagnosis not present

## 2023-03-02 DIAGNOSIS — A4189 Other specified sepsis: Secondary | ICD-10-CM | POA: Diagnosis present

## 2023-03-02 DIAGNOSIS — I129 Hypertensive chronic kidney disease with stage 1 through stage 4 chronic kidney disease, or unspecified chronic kidney disease: Secondary | ICD-10-CM | POA: Diagnosis present

## 2023-03-02 DIAGNOSIS — E1143 Type 2 diabetes mellitus with diabetic autonomic (poly)neuropathy: Secondary | ICD-10-CM | POA: Diagnosis present

## 2023-03-02 DIAGNOSIS — Z833 Family history of diabetes mellitus: Secondary | ICD-10-CM | POA: Diagnosis not present

## 2023-03-02 DIAGNOSIS — R55 Syncope and collapse: Secondary | ICD-10-CM | POA: Diagnosis present

## 2023-03-02 LAB — RESP PANEL BY RT-PCR (RSV, FLU A&B, COVID)  RVPGX2
Influenza A by PCR: NEGATIVE
Influenza B by PCR: NEGATIVE
Resp Syncytial Virus by PCR: NEGATIVE
SARS Coronavirus 2 by RT PCR: NEGATIVE

## 2023-03-02 LAB — BASIC METABOLIC PANEL
Anion gap: 8 (ref 5–15)
BUN: 34 mg/dL — ABNORMAL HIGH (ref 6–20)
CO2: 15 mmol/L — ABNORMAL LOW (ref 22–32)
Calcium: 7.9 mg/dL — ABNORMAL LOW (ref 8.9–10.3)
Chloride: 112 mmol/L — ABNORMAL HIGH (ref 98–111)
Creatinine, Ser: 2.63 mg/dL — ABNORMAL HIGH (ref 0.44–1.00)
GFR, Estimated: 24 mL/min — ABNORMAL LOW (ref >60)
Glucose, Bld: 195 mg/dL — ABNORMAL HIGH (ref 70–99)
Potassium: 3.7 mmol/L (ref 3.5–5.1)
Sodium: 135 mmol/L (ref 135–145)

## 2023-03-02 LAB — LACTIC ACID, PLASMA: Lactic Acid, Venous: 0.9 mmol/L (ref 0.5–1.9)

## 2023-03-02 LAB — CBC WITH DIFFERENTIAL/PLATELET
Abs Immature Granulocytes: 0.11 10*3/uL — ABNORMAL HIGH (ref 0.00–0.07)
Basophils Absolute: 0 10*3/uL (ref 0.0–0.1)
Basophils Relative: 0 %
Eosinophils Absolute: 0 10*3/uL (ref 0.0–0.5)
Eosinophils Relative: 0 %
HCT: 30.8 % — ABNORMAL LOW (ref 36.0–46.0)
Hemoglobin: 9.4 g/dL — ABNORMAL LOW (ref 12.0–15.0)
Immature Granulocytes: 1 %
Lymphocytes Relative: 6 %
Lymphs Abs: 0.6 10*3/uL — ABNORMAL LOW (ref 0.7–4.0)
MCH: 28.1 pg (ref 26.0–34.0)
MCHC: 30.5 g/dL (ref 30.0–36.0)
MCV: 91.9 fL (ref 80.0–100.0)
Monocytes Absolute: 0.4 10*3/uL (ref 0.1–1.0)
Monocytes Relative: 4 %
Neutro Abs: 9.4 10*3/uL — ABNORMAL HIGH (ref 1.7–7.7)
Neutrophils Relative %: 89 %
Platelets: 304 10*3/uL (ref 150–400)
RBC: 3.35 MIL/uL — ABNORMAL LOW (ref 3.87–5.11)
RDW: 15.5 % (ref 11.5–15.5)
WBC: 10.5 10*3/uL (ref 4.0–10.5)
nRBC: 0 % (ref 0.0–0.2)

## 2023-03-02 LAB — COMPREHENSIVE METABOLIC PANEL
ALT: 19 U/L (ref 0–44)
AST: 20 U/L (ref 15–41)
Albumin: <1.5 g/dL — ABNORMAL LOW (ref 3.5–5.0)
Alkaline Phosphatase: 75 U/L (ref 38–126)
Anion gap: 5 (ref 5–15)
BUN: 34 mg/dL — ABNORMAL HIGH (ref 6–20)
CO2: 15 mmol/L — ABNORMAL LOW (ref 22–32)
Calcium: 7.5 mg/dL — ABNORMAL LOW (ref 8.9–10.3)
Chloride: 117 mmol/L — ABNORMAL HIGH (ref 98–111)
Creatinine, Ser: 2.38 mg/dL — ABNORMAL HIGH (ref 0.44–1.00)
GFR, Estimated: 27 mL/min — ABNORMAL LOW (ref >60)
Glucose, Bld: 166 mg/dL — ABNORMAL HIGH (ref 70–99)
Potassium: 3.5 mmol/L (ref 3.5–5.1)
Sodium: 137 mmol/L (ref 135–145)
Total Bilirubin: 0.4 mg/dL (ref 0.0–1.2)
Total Protein: 4.4 g/dL — ABNORMAL LOW (ref 6.5–8.1)

## 2023-03-02 LAB — GLUCOSE, CAPILLARY
Glucose-Capillary: 41 mg/dL — CL (ref 70–99)
Glucose-Capillary: 55 mg/dL — ABNORMAL LOW (ref 70–99)
Glucose-Capillary: 73 mg/dL (ref 70–99)
Glucose-Capillary: 91 mg/dL (ref 70–99)
Glucose-Capillary: 94 mg/dL (ref 70–99)

## 2023-03-02 LAB — CBG MONITORING, ED
Glucose-Capillary: 105 mg/dL — ABNORMAL HIGH (ref 70–99)
Glucose-Capillary: 114 mg/dL — ABNORMAL HIGH (ref 70–99)
Glucose-Capillary: 136 mg/dL — ABNORMAL HIGH (ref 70–99)

## 2023-03-02 LAB — HEPATIC FUNCTION PANEL
ALT: 20 U/L (ref 0–44)
AST: 21 U/L (ref 15–41)
Albumin: <1.5 g/dL — ABNORMAL LOW (ref 3.5–5.0)
Alkaline Phosphatase: 83 U/L (ref 38–126)
Bilirubin, Direct: <0.1 mg/dL (ref 0.0–0.2)
Total Bilirubin: 0.3 mg/dL (ref 0.0–1.2)
Total Protein: 4.7 g/dL — ABNORMAL LOW (ref 6.5–8.1)

## 2023-03-02 LAB — PROTIME-INR
INR: 1.1 (ref 0.8–1.2)
Prothrombin Time: 14 s (ref 11.4–15.2)

## 2023-03-02 LAB — MAGNESIUM
Magnesium: 1.5 mg/dL — ABNORMAL LOW (ref 1.7–2.4)
Magnesium: 2.1 mg/dL (ref 1.7–2.4)

## 2023-03-02 LAB — HEMOGLOBIN A1C
Hgb A1c MFr Bld: 7.2 % — ABNORMAL HIGH (ref 4.8–5.6)
Mean Plasma Glucose: 159.94 mg/dL

## 2023-03-02 LAB — TSH: TSH: 0.931 u[IU]/mL (ref 0.350–4.500)

## 2023-03-02 LAB — PROCALCITONIN: Procalcitonin: 1.8 ng/mL

## 2023-03-02 MED ORDER — FENTANYL CITRATE PF 50 MCG/ML IJ SOSY
50.0000 ug | PREFILLED_SYRINGE | INTRAMUSCULAR | Status: DC | PRN
  Administered 2023-03-02: 50 ug via INTRAVENOUS
  Filled 2023-03-02: qty 1

## 2023-03-02 MED ORDER — HYDROMORPHONE HCL 1 MG/ML IJ SOLN
0.5000 mg | INTRAMUSCULAR | Status: DC | PRN
  Administered 2023-03-02 – 2023-03-06 (×27): 0.5 mg via INTRAVENOUS
  Filled 2023-03-02 (×13): qty 0.5
  Filled 2023-03-02: qty 1
  Filled 2023-03-02 (×9): qty 0.5
  Filled 2023-03-02: qty 1
  Filled 2023-03-02: qty 0.5
  Filled 2023-03-02: qty 1
  Filled 2023-03-02 (×2): qty 0.5

## 2023-03-02 MED ORDER — HYDROCORTISONE 1 % EX CREA
TOPICAL_CREAM | CUTANEOUS | Status: DC | PRN
  Filled 2023-03-02: qty 28

## 2023-03-02 MED ORDER — INSULIN GLARGINE-YFGN 100 UNIT/ML ~~LOC~~ SOLN
8.0000 [IU] | Freq: Two times a day (BID) | SUBCUTANEOUS | Status: DC
  Administered 2023-03-02 (×2): 8 [IU] via SUBCUTANEOUS
  Filled 2023-03-02 (×5): qty 0.08

## 2023-03-02 MED ORDER — SODIUM CHLORIDE 0.9 % IV BOLUS
1000.0000 mL | Freq: Once | INTRAVENOUS | Status: AC
  Administered 2023-03-02: 1000 mL via INTRAVENOUS

## 2023-03-02 MED ORDER — LORAZEPAM 2 MG/ML IJ SOLN: 0.5000 mg | Freq: Four times a day (QID) | INTRAMUSCULAR | Status: DC | PRN

## 2023-03-02 MED ORDER — ACETAMINOPHEN 325 MG PO TABS
650.0000 mg | ORAL_TABLET | Freq: Four times a day (QID) | ORAL | Status: DC | PRN
  Administered 2023-03-07: 650 mg via ORAL
  Filled 2023-03-02: qty 2

## 2023-03-02 MED ORDER — LACTATED RINGERS IV SOLN: INTRAVENOUS | Status: AC

## 2023-03-02 MED ORDER — INSULIN ASPART 100 UNIT/ML IJ SOLN
0.0000 [IU] | Freq: Three times a day (TID) | INTRAMUSCULAR | Status: DC
  Administered 2023-03-02: 2 [IU] via SUBCUTANEOUS
  Filled 2023-03-02: qty 0.15

## 2023-03-02 MED ORDER — HYDROMORPHONE HCL 1 MG/ML IJ SOLN
1.0000 mg | Freq: Once | INTRAMUSCULAR | Status: AC
Start: 2023-03-02 — End: 2023-03-02
  Administered 2023-03-02: 1 mg via INTRAVENOUS
  Filled 2023-03-02: qty 1

## 2023-03-02 MED ORDER — ACETAMINOPHEN 650 MG RE SUPP: 650.0000 mg | Freq: Four times a day (QID) | RECTAL | Status: DC | PRN

## 2023-03-02 MED ORDER — HYDROMORPHONE HCL 1 MG/ML IJ SOLN
0.5000 mg | Freq: Once | INTRAMUSCULAR | Status: AC
  Administered 2023-03-02: 0.5 mg via INTRAVENOUS
  Filled 2023-03-02: qty 1

## 2023-03-02 MED ORDER — MELATONIN 3 MG PO TABS
3.0000 mg | ORAL_TABLET | Freq: Every evening | ORAL | Status: DC | PRN
  Administered 2023-03-02: 3 mg via ORAL
  Filled 2023-03-02: qty 1

## 2023-03-02 MED ORDER — NALOXONE HCL 0.4 MG/ML IJ SOLN: 0.4000 mg | INTRAMUSCULAR | Status: DC | PRN

## 2023-03-02 MED ORDER — ACETAMINOPHEN 325 MG PO TABS
650.0000 mg | ORAL_TABLET | Freq: Once | ORAL | Status: AC
  Administered 2023-03-02: 650 mg via ORAL
  Filled 2023-03-02 (×2): qty 2

## 2023-03-02 MED ORDER — MAGNESIUM SULFATE 2 GM/50ML IV SOLN
2.0000 g | Freq: Once | INTRAVENOUS | Status: AC
  Administered 2023-03-02: 2 g via INTRAVENOUS
  Filled 2023-03-02: qty 50

## 2023-03-02 MED ORDER — PROCHLORPERAZINE EDISYLATE 10 MG/2ML IJ SOLN
10.0000 mg | Freq: Four times a day (QID) | INTRAMUSCULAR | Status: DC | PRN
  Administered 2023-03-03 – 2023-03-05 (×4): 10 mg via INTRAVENOUS
  Filled 2023-03-02 (×4): qty 2

## 2023-03-02 NOTE — ED Notes (Signed)
 Pt received lunch tray

## 2023-03-02 NOTE — ED Notes (Signed)
 Pt received breakfast tray

## 2023-03-02 NOTE — ED Notes (Signed)
Assisted pt to bathroom in wheelchair.

## 2023-03-02 NOTE — ED Notes (Signed)
 Assisted pt to bathroom with wheelchair.

## 2023-03-02 NOTE — H&P (Addendum)
 History and Physical      Kelli Hudson FMW:[id number] DOB: [birth year] DOA: 03/01/2023; DOS: 03/02/2023  PCP: Arloa Jarvis, NP  Patient coming from: home   I have personally briefly reviewed patient's old medical records in [facility]  Chief Complaint: Nausea, vomiting, diarrhea  HPI: Kelli Hudson is a 33 y.o. female with medical history significant for CKD 3B associated baseline creatinine 1.3-1.6, type 2 diabetes mellitus complicated by diabetic peripheral polyneuropathy, gastroparesis, GERD, essential hypertension, bilateral BKA's, anemia of chronic disease associated baseline hemoglobin 10-12.5, who is admitted to [facility] on 03/01/2023 with acute kidney injury superimposed on CKD 3B after presenting from home to [facility] ED complaining of nausea, vomiting, diarrhea.    The patient reports nausea, vomiting, and diarrhea that started over the course of the last day.  She notes that her initial symptoms involved nausea resulting in a single episode of nonbloody, nonbilious emesis.  Her nausea has subsequently improved, but now has developed loose stool, with the patient reporting a total of 3 episodes of loose stool this evening, without any associated melena or hematochezia.  She notes very mild generalized, nonradiating abdominal discomfort, which she states feels different than the pain that she has previously experienced at times of prior hospitalizations for diabetic gastroparesis.   She has also developed an objective fever this evening, and reports associated generalized myalgias in the absence of chills or full body rigors.  No recent headache, neck stiffness, rash, cough, shortness of breath, dysuria or gross hematuria.  No recent chest pain, shortness of breath, palpitations, diaphoresis.     ED Course:  Vital signs in the ED were notable for the following: Temperature max 102.5; heart rates in the range of 90s to 120s; initial systolic blood pressures  in the 90s, subsequently increasing into the 120s following interval IV fluids, as further quantified below; respiratory rate 17, oxygen saturation 96 to 97% on room air.  Labs were notable for the following: CMP notable for the following: Sodium 135, potassium 3.5, bicarbonate 15, anion gap 8, creatinine 2.63 compared to most recent prior value of 1.65 on 02/27/2023, glucose 195, calcium  adjusted for hypoalbuminemia noted to be 9.9, albumin  1.5.  Otherwise, liver enzymes are within normal limits.  Magnesium  level 1.5.  Initial lactic acid 1.2, with repeat value trending down to 0.9.  CBC notable for evidence of count 12,100, hemoglobin 10.3 associated Neuraceq/Norocarp findings and nonelevated RDW, and relative to most recent prior hemoglobin data point of 12.6 on 02/27/2023, platelet count 339.  Urinalysis ordered, Therazole currently pending.  COVID, influenza, RSV PCR were all negative.  Per my interpretation, EKG in ED demonstrated the following: EKG has been performed, but not yet released from my independent review.  Imaging in the ED, per corresponding formal radiology read, was notable for the following: No imaging performed in the ED this evening.  While in the ED, the following were administered: Acetaminophen  650 mg p.o. x 1 dose, Benadryl  25 mg IM x 1 dose, Dilaudid  1 mg IV x 1 dose, Compazine  10 mg IM x 1 dose.  Normal saline x 1 L bolus, along with an order for an additional 1 L NS, which has not yet been administered.  Subsequently, the patient was admitted for further evaluation management of presenting acute kidney injury superimposed on CKD 3B in the setting of clinical evidence of dehydration, with presentation also notable for the presence of SIRS criteria, labs notable for hypomagnesemia, nonanion gap metabolic acidosis, after presenting with complaint of  nausea, vomiting, diarrhea.    Review of Systems: As per HPI otherwise 10 point review of systems negative.   Past Medical  History:  Diagnosis Date   Acute H. pylori gastric ulcer    Coffee ground emesis    Diabetes mellitus (HCC)    Diabetic gastroparesis (HCC)    DKA (diabetic ketoacidosis) (HCC) 02/24/2021   Gastroparesis    GERD (gastroesophageal reflux disease)    Hypertension    Hyperthyroidism    Intractable nausea and vomiting 04/20/2021   Normocytic anemia 04/20/2020   Prolonged Q-T interval on ECG     Past Surgical History:  Procedure Laterality Date   AMPUTATION Bilateral 10/24/2022   Procedure: BILATERAL BELOW KNEE AMPUTATION;  Surgeon: Harden Jerona GAILS, MD;  Location: [facility] OR;  Service: Orthopedics;  Laterality: Bilateral;   AMPUTATION Bilateral 10/08/2022   Procedure: BILATERAL LEG DEBRIDEMENT;  Surgeon: Harden Jerona GAILS, MD;  Location: [facility] OR;  Service: Orthopedics;  Laterality: Bilateral;   AMPUTATION Bilateral 11/14/2022   Procedure: BILATERAL ABOVE KNEE AMPUTATION;  Surgeon: Harden Jerona GAILS, MD;  Location: [facility] OR;  Service: Orthopedics;  Laterality: Bilateral;   AMPUTATION TOE Left 03/10/2018   Procedure: AMPUTATION FIFTH TOE;  Surgeon: Gershon Donnice SAUNDERS, DPM;  Location: [facility];  Service: Podiatry;  Laterality: Left;   APPLICATION OF WOUND VAC Bilateral 10/12/2022   Procedure: APPLICATION OF WOUND VAC BILATERAL LEGS;  Surgeon: Celena Sharper, MD;  Location: [facility] OR;  Service: Orthopedics;  Laterality: Bilateral;   APPLICATION OF WOUND VAC Bilateral 10/24/2022   Procedure: APPLICATION OF WOUND VAC;  Surgeon: Harden Jerona GAILS, MD;  Location: [facility] OR;  Service: Orthopedics;  Laterality: Bilateral;   BIOPSY  01/28/2020   Procedure: BIOPSY;  Surgeon: Elicia Claw, MD;  Location: [facility];  Service: Gastroenterology;;   BOTOX  INJECTION  08/26/2021   Procedure: BOTOX  INJECTION;  Surgeon: Legrand Victory LITTIE DOUGLAS, MD;  Location: [facility];  Service: Gastroenterology;;   ESOPHAGOGASTRODUODENOSCOPY N/A 01/28/2020   Procedure: ESOPHAGOGASTRODUODENOSCOPY (EGD);  Surgeon: Elicia Claw,  MD;  Location: [facility];  Service: Gastroenterology;  Laterality: N/A;   ESOPHAGOGASTRODUODENOSCOPY (EGD) WITH PROPOFOL  Left 09/08/2015   Procedure: ESOPHAGOGASTRODUODENOSCOPY (EGD) WITH PROPOFOL ;  Surgeon: Elsie Cree, MD;  Location: [facility];  Service: Endoscopy;  Laterality: Left;   ESOPHAGOGASTRODUODENOSCOPY (EGD) WITH PROPOFOL  N/A 08/26/2021   Procedure: ESOPHAGOGASTRODUODENOSCOPY (EGD) WITH PROPOFOL ;  Surgeon: Legrand Victory LITTIE DOUGLAS, MD;  Location: [facility];  Service: Gastroenterology;  Laterality: N/A;   GASTRIC STIMULATOR IMPLANT SURGERY  06/2022   at [facility]   I & D EXTREMITY Bilateral 10/12/2022   Procedure: IRRIGATION AND  DEBRIDEMENT  BILATERAL LEGS;  Surgeon: Celena Sharper, MD;  Location: [facility] OR;  Service: Orthopedics;  Laterality: Bilateral;   I & D EXTREMITY Right 10/13/2022   Procedure: RIGHT THIGH WOUND VAC EXCHANGE;  Surgeon: Kendal Franky SQUIBB, MD;  Location: [facility] OR;  Service: Orthopedics;  Laterality: Right;   I & D EXTREMITY Bilateral 10/17/2022   Procedure: BILATERAL THIGH AND LEG DEBRIDEMENT, PARTIAL BILATERAL FOOT AMPUTATION;  Surgeon: Harden Jerona GAILS, MD;  Location: [facility] OR;  Service: Orthopedics;  Laterality: Bilateral;   I & D EXTREMITY Bilateral 11/05/2022   Procedure: BILATERAL THIGH DEBRIDEMENT;  Surgeon: Harden Jerona GAILS, MD;  Location: [facility] OR;  Service: Orthopedics;  Laterality: Bilateral;   I & D EXTREMITY Bilateral 11/07/2022   Procedure: BILATERAL THIGH AND LEG DEBRIDEMENT;  Surgeon: Harden Jerona GAILS, MD;  Location: [facility] OR;  Service: Orthopedics;  Laterality: Bilateral;   I & D  EXTREMITY Bilateral 11/12/2022   Procedure: BILATERAL LEG DEBRIDEMENTS;  Surgeon: Harden Jerona GAILS, MD;  Location: [facility] OR;  Service: Orthopedics;  Laterality: Bilateral;   IRRIGATION AND DEBRIDEMENT FOOT Bilateral 10/05/2022   Procedure: IRRIGATION AND DEBRIDEMENT FOOT;  Surgeon: Janit Thresa HERO, DPM;  Location: [facility];  Service: Orthopedics/Podiatry;  Laterality: Bilateral;   WISDOM TOOTH EXTRACTION       Social History:  reports that she has never smoked. She has never used smokeless tobacco. She reports current alcohol use of about 1.0 standard drink of alcohol per week. She reports that she does not use drugs.   Allergies  Allergen Reactions   Ibuprofen  Other (See Comments)    07/23/22 - Worsening kidney function after 3 doses of ibuprofen  400mg .  Creatinine increased from 1.3 to 2.  Creatinine returned to normal after stopping.   Lisinopril  Cough, Other (See Comments), Itching and Rash    Breakout in rash    Family History  Problem Relation Age of Onset   Lung cancer Mother    Diabetes Mother    Bipolar disorder Father    Liver cancer Maternal Grandfather    Diabetes Maternal Aunt        x 2   Pancreatic cancer Maternal Uncle    Prostate cancer Maternal Uncle    Breast cancer Other        maternal great aunt   Heart disease Other    Ovarian cancer Other        maternal great aunt   Kidney disease Other        maternal great aunt   Colon cancer Neg Hx    Stomach cancer Neg Hx     Family history reviewed and not pertinent    Prior to Admission medications   Medication Sig Start Date End Date Taking? Authorizing Provider  acetaminophen  (TYLENOL ) 325 MG tablet Take 2 tablets (650 mg total) by mouth every 6 (six) hours as needed for mild pain, fever or headache. 11/22/22  Yes Lue Elsie BROCKS, MD  amLODipine  (NORVASC ) 10 MG tablet Take 1 tablet (10 mg total) by mouth daily. 12/04/22  Yes Elgergawy, Brayton RAMAN, MD  ascorbic acid  (VITAMIN C ) 1000 MG tablet Take 1 tablet (1,000 mg total) by mouth daily. Patient taking differently: Take 1,000 mg by mouth once a week. 11/22/22  Yes Lue Elsie BROCKS, MD  carvedilol  (COREG ) 6.25 MG tablet Take 1 tablet (6.25 mg total) by mouth 2 (two) times daily with a meal. 12/03/22  Yes Elgergawy, Brayton RAMAN, MD  etonogestrel  (NEXPLANON ) 68 MG IMPL implant 1 each by Subdermal route continuous.   Yes [provider]  famotidine   (PEPCID ) 20 MG tablet Take 1 tablet (20 mg total) by mouth 2 (two) times daily. 12/03/22  Yes Elgergawy, Brayton RAMAN, MD  feeding supplement, GLUCERNA SHAKE, (GLUCERNA SHAKE) LIQD Take 237 mLs by mouth 3 (three) times daily between meals. 11/22/22  Yes Lue Elsie BROCKS, MD  gabapentin  (NEURONTIN ) 300 MG capsule Take 1 capsule (300 mg total) by mouth every 8 (eight) hours. 11/22/22  Yes Lue Elsie BROCKS, MD  insulin  aspart (NOVOLOG ) 100 UNIT/ML injection Inject 0-9 Units into the skin 3 (three) times daily with meals. Patient taking differently: Inject 10-30 Units into the skin 3 (three) times daily with meals. 12/03/22  Yes Elgergawy, Brayton RAMAN, MD  insulin  detemir (LEVEMIR  FLEXTOUCH) 100 UNIT/ML FlexPen Inject 4 Units into the skin 2 (two) times daily. Patient taking differently: Inject 10 Units into the skin 2 (two) times  daily. 11/22/22  Yes Lue Elsie BROCKS, MD  MAGNESIUM  PO Take 1 tablet by mouth daily.   Yes [provider]  methocarbamol  (ROBAXIN ) 500 MG tablet Take 500 mg by mouth 3 (three) times daily as needed for muscle spasms. 11/22/22  Yes [provider]  metoCLOPramide  (REGLAN ) 10 MG tablet Take 1 tablet (10 mg total) by mouth every 8 (eight) hours as needed for nausea. 01/23/23  Yes Adhikari, Amrit, MD  Multiple Vitamin (MULTIVITAMIN WITH MINERALS) TABS tablet Take 1 tablet by mouth daily. 11/22/22  Yes Lue Elsie BROCKS, MD  potassium chloride  SA (KLOR-CON  M) 20 MEQ tablet Take 1 tablet (20 mEq total) by mouth daily for 5 days. 02/23/23 03/02/23 Yes Danford, Lonni SQUIBB, MD  sucralfate  (CARAFATE ) 1 g tablet Take 1 g by mouth in the morning, at noon, and at bedtime. 08/28/22  Yes [provider]  traZODone  (DESYREL ) 150 MG tablet Take 150 mg by mouth at bedtime. 01/14/23  Yes [provider]  Continuous Glucose Transmitter (DEXCOM G6 TRANSMITTER) MISC 1 Device by Does not apply route as directed. 06/18/22   Shamleffer, Ibtehal Jaralla, MD  Insulin   Pen Needle 32G X 4 MM MISC 1 Device by Does not apply route in the morning, at noon, in the evening, and at bedtime. 06/18/22   Shamleffer, Donell Cardinal, MD  nutrition supplement, JUVEN, (JUVEN) PACK Take 1 packet by mouth 2 (two) times daily between meals. Patient not taking: Reported on 03/02/2023 11/22/22   Lue Elsie BROCKS, MD     Objective    Physical Exam: Vitals:   03/01/23 2222 03/02/23 0030 03/02/23 0100 03/02/23 0208  BP: 94/66 120/74 128/82   Pulse: (!) 108 (!) 122 (!) 124   Resp: 17  17   Temp: 99.3 F (37.4 C)   (!) 102.5 F (39.2 C)  TempSrc: Oral   Oral  SpO2: 97% 96% 96%     General: appears to be stated age; alert, oriented Skin: warm, dry, no rash Head:  AT/[place] Mouth:  Oral mucosa membranes appear dry, normal dentition Neck: supple; trachea midline Heart: Tachycardic, regular; did not appreciate any M/R/G Lungs: CTAB, did not appreciate any wheezes, rales, or rhonchi Abdomen: + BS; soft, ND, NT Vascular:  2+ radial pulses b/l Extremities: no peripheral edema, bilateral BKA's noted Neuro: strength and sensation intact in upper and lower extremities b/l    Labs on Admission: I have personally reviewed following labs and imaging studies  CBC: Recent Labs  Lab 02/23/23 0614 02/27/23 0034 03/01/23 2325  WBC 10.4 9.4 12.1*  HGB 10.7* 12.6 10.3*  HCT 34.2* 39.5 33.0*  MCV 88.6 88.2 92.2  PLT 477* 490* 339   Basic Metabolic Panel: Recent Labs  Lab 02/23/23 0614 02/27/23 0034 03/02/23 0134  NA 140 131* 135  K 3.0* 2.7* 3.7  CL 109 100 112*  CO2 22 20* 15*  GLUCOSE 165* 317* 195*  BUN 17 23* 34*  CREATININE 1.60* 1.65* 2.63*  CALCIUM  8.4* 8.3* 7.9*  MG  --  1.9 1.5*   GFR: Estimated Creatinine Clearance: 31.3 mL/min (A) (by C-G formula based on SCr of 2.63 mg/dL (H)). Liver Function Tests: Recent Labs  Lab 02/27/23 0034 03/02/23 0134  AST 14* 21  ALT 14 20  ALKPHOS 132* 83  BILITOT 1.0 0.3  PROT 6.2* 4.7*  ALBUMIN  2.0* <1.5*    Recent Labs  Lab 02/27/23 0034  LIPASE 20   No results for input(s): AMMONIA in the last 168 hours. Coagulation Profile:  No results for input(s): INR, PROTIME in the last 168 hours. Cardiac Enzymes: No results for input(s): CKTOTAL, CKMB, CKMBINDEX, TROPONINI in the last 168 hours. BNP (last 3 results) No results for input(s): PROBNP in the last 8760 hours. HbA1C: No results for input(s): HGBA1C in the last 72 hours. CBG: Recent Labs  Lab 02/23/23 0533 02/23/23 0822 02/23/23 1145 02/26/23 2130 03/01/23 2156  GLUCAP 160* 210* 122* 243* 152*   Lipid Profile: No results for input(s): CHOL, HDL, LDLCALC, TRIG, CHOLHDL, LDLDIRECT in the last 72 hours. Thyroid Function Tests: No results for input(s): TSH, T4TOTAL, FREET4, T3FREE, THYROIDAB in the last 72 hours. Anemia Panel: No results for input(s): VITAMINB12, FOLATE, FERRITIN, TIBC, IRON , RETICCTPCT in the last 72 hours. Urine analysis:    Component Value Date/Time   COLORURINE YELLOW 05/05/2022 1416   APPEARANCEUR CLEAR 05/05/2022 1416   LABSPEC 1.016 05/05/2022 1416   PHURINE 6.0 05/05/2022 1416   GLUCOSEU >=500 (A) 05/05/2022 1416   GLUCOSEU NEGATIVE 05/22/2020 1417   HGBUR SMALL (A) 05/05/2022 1416   BILIRUBINUR NEGATIVE 05/05/2022 1416   KETONESUR NEGATIVE 05/05/2022 1416   PROTEINUR >=300 (A) 05/05/2022 1416   UROBILINOGEN 0.2 05/22/2020 1417   NITRITE NEGATIVE 05/05/2022 1416   LEUKOCYTESUR NEGATIVE 05/05/2022 1416    Radiological Exams on Admission: No results found.    Assessment/Plan    Principal Problem:   Acute renal failure superimposed on stage 3b chronic kidney disease (HCC) Active Problems:   Essential hypertension   GERD (gastroesophageal reflux disease)   DM2 (diabetes mellitus, type 2) (HCC)   Hypomagnesemia   Dehydration   Anemia of chronic disease   Nausea vomiting and diarrhea   SIRS (systemic inflammatory response syndrome)  (HCC)   Fever   Metabolic acidosis    #) Acute Kidney Injury superimposed on CKD 3B: Relative to baseline creatinine range of 1.3-1.6, presenting creatinine found to be 2.63, increased over the last few days, compared to most recent prior value 1.65 on 02/27/2023.  Suspect prerenal influences in the setting of clinical evidence of dehydration stemming from increase in GI losses over the course of the last day, as further detailed above.  Urinalysis with microscopy has been ordered, Therazole currently pending.  Will pursue additional IV fluids expand diagnostic evaluation as outlined below.  While patient does present with nausea, vomiting, diarrhea, low suspicion for potential element of gastroenteritis, HUS appears less likely at this time.   Plan: monitor strict I's & O's and daily weights. Attempt to avoid nephrotoxic agents.  Hold home gabapentin  for now.  Refrain from NSAIDs. Repeat CMP in the morning.  Follow for result of urinalysis with microscopy.  Add-on random urine sodium and random urine creatinine.  Lactated Ringer 's at 125 cc/h x 12 hours.  Further evaluation management of presenting nausea, vomiting, diarrhea, as outlined below.                     #) Dehydration: Clinical suspicion for such, including the appearance of dry oral mucous membranes as well as laboratory findings notable for soft initial blood pressure that resolved with IV fluids . appears to be in the setting of   increased GI losses over the course the last day, as above.  Appears contributory to her presenting acute kidney injury superimposed on CKD 3B.   Plan: Monitor strict I's and O's.  Daily weights.  CMP in the morning. IVF's in form of lactated Ringer 's at 125 cc/h x 12 hours.SABRA                   #)  SIRS criteria present: Presentation associated with objective fever of 102.5, tachycardia, mildly elevated white blood cell count at 12,100.  However, in the absence of e/o underlying  infection at this time, including, negative COVID, influenza, RSV PCR, criteria for sepsis not currently met.  Urinalysis has been ordered, Therazole currently pending.  Will also pursue chest x-ray.  Differential also includes potential viral gastroenteritis given the sequence of her nausea/vomiting followed by mild abdominal discomfort and then multiple episodes of loose stool associated with generalized myalgias.   patient appears hemodynamically stable at this time.  In the context of her hemodynamic stability and the absence of overt underlying infectious source,  will refrain from initiation of IV antibiotics at this time.  Of note, lactic acid x 2 were nonelevated, with second value trending down.   Plan: Repeat CBC with diff in the AM.  Monitor strict I's and O's and daily weights.  Monitor on telemetry. Refraining from IV abx for now, as above.  Lactated Ringer 's, as above.  Check urinalysis, chest x-ray, procalcitonin level.  Prn acetaminophen  for fever.  Check blood cultures x 2.                 #) Hypomagnesemia: presenting serum mag level noted to be 1.5, which appears to be in the setting of her recent increase in GI losses, as above.    Plan: magnesium  sulfate 2 g IV over  2 hours. Monitor on tele. Repeat serum mag level in the AM.                 #) Non-anion gap metabolic acidosis: Presenting CMP reflects a mild anion gap metabolic acidosis, with bicarbonate of 15 and anion gap 8.  Differential includes her recent loose stool versus renal tubular acidosis, noting concomitant mild hyperchloremia.   Plan: Further evaluation management of presenting diarrhea, as further outlined below.  IV fluids, as above.  Repeat CMP  in the morning.                #) Nausea, vomiting, diarrhea: Differential includes the possibility of viral gastro enteritis given her symptoms and associated sequence thereof, noting initial nausea/vomiting of mild abdominal  discomfort and ultimately multiple episodes of nonbloody loose stool, associated with generalized myalgias, raising possibility of a potential viral etiology.  No evidence of acute peritoneal findings on physical exam.  Clinically, and per patient report, his presentation appears less consistent with her prior hospitalizations for intractable nausea/vomiting due to gastroparesis, as her nausea/vomiting are already significantly improved in the absence of significant abdominal discomfort.  Of note, liver enzymes appear within normal limits.  Will also hold home medications that can be associated with increased risk for diarrhea, as further detailed below.  Clinically, presentation appears less suggestive of C. Difficile.  Suspect that the above will be self-limited given suspected viral etiology.  Will consider expanding evaluation, including pursuit of stool studies, if there is further persistence in the above symptoms.  Plan: IV fluids, as above.  Prn IV Compazine .  As needed IV Ativan  for nausea/vomiting refractory to Compazine .  Prn IV fentanyl .  At given documentation of a history of hyperthyroidism, will add on TSH level.  Check urinary drug screen.  Further evaluation management of presenting hypomagnesemia, as above.  Repeat CMP, CBC in the morning.  Hold home Pepcid .                   #) Type 2 Diabetes Mellitus: documented history of such. Home insulin   regimen: Levemir  10 units SQ twice daily as well as 3 times daily NovoLog  sliding scale. Home oral hypoglycemic agents: none. presenting blood sugar: 195. Most recent A1c noted to be 5.9% when checked on 11/18/2022.  Her history of diabetes is complicated by documentation of peripheral polyneuropathy for which she is on gabapentin  at home, in addition to gastroparesis.  Of note, no elevation in anion gap this evening.  Consequently, presentation appears less consistent with DKA at this time.  in terms of initial dose of basal insulin  to  be started during this hospitalization, will resume slightly more than half of outpatient dose in order to reduce risk for ensuing hypoglycemia.  Plan: accuchecks QAC and HS with moderate dose SSI.  IV fluids, as above.  Glargine 8 units SQ twice daily, first dose now.  Add on hemoglobin A1c level.  Holding home gabapentin  for now in the setting of acute kidney injury superimposed on CKD 3B.                   #) GERD: documented h/o such; on Pepcid  as outpatient.  However, will hold home Pepcid  for now in the setting of her presenting diarrhea, as diarrhea is one of the more common side effects associated with Pepcid .  Plan: Hold home H2 blocker for now, as above.                    #) Essential Hypertension: documented h/o such, with outpatient antihypertensive regimen including carvedilol , amlodipine .  SBP's in the ED today: Initially in the 90s, symptoms improving into the 120s mmHg following IV fluids.  Given her initial soft blood pressures, will hold home antihypertensive medications for now.  Plan: Close monitoring of subsequent BP via routine VS. hold home Coreg  and amlodipine  for now, as above.  Monitor on telemetry.                  #) Anemia of chronic disease: Documented history of such, a/w with baseline hgb range 10-12.5, with presenting hgb consistent with this range, in the absence of any overt evidence of active bleed.     Plan: Repeat CBC in the morning.  Check INR.       DVT prophylaxis: SCD's   Code Status: Full code Family Communication: none Disposition Plan: Per Rounding Team Consults called: none;  Admission status: Inpatient    I SPENT GREATER THAN 75  MINUTES IN CLINICAL CARE TIME/MEDICAL DECISION-MAKING IN COMPLETING THIS ADMISSION.     Basha Krygier B Adiel Mcnamara DO [facility] From 7PM - 7AM   03/02/2023, 2:57 AM

## 2023-03-02 NOTE — ED Notes (Addendum)
 Attempted to collect blood from placed ultrasound IV. Upon assessment by this RN Iv had no blood return and was unable to flush. Paused and unhooked fluid bolus and placed IV team consult. MD aware of delay of care at this time.

## 2023-03-02 NOTE — Discharge Summary (Deleted)
 Same day note  Kelli Hudson is a 33 y.o. 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  In the ED, patient was tachycardic and febrile.  Labs showed hypokalemia with potassium of 3.5 with creatinine of 2.6 compared to baseline 1.6.  Magnesium  level slightly low at 1.5.  Lactate was 1.2 and mild leukocytosis at 12, 100. COVID, influenza, RSV PCR were all negative.  EKG showed normal sinus rhythm.  Patient was then considered for admission to the hospital for acute kidney injury secondary to volume depletion with SIRS criteria.   Patient seen and examined at bedside.  Patient was admitted to the hospital for  At the time of my evaluation, patient complains of less nausea and vomiting and was able to tolerate some clears.  Has been having some loose stools.  Physical examination reveals alert awake and Communicative.  Bilateral AKA.  Nonspecific abdominal tenderness.  Mucosa dry.  Laboratory data and imaging was reviewed   Assessment and plan.   Acute Kidney Injury superimposed on CKD 3B: Creatinine around 1.3-1.6, initial creatinine of 2.6.  Repeat creatinine at 2.3, likely secondary to nausea vomiting diarrhea.  Holding gabapentin  at this time.  Check a urinalysis.  Continue IV fluid hydration.     Volume depletion secondary to nausea vomiting diarrhea.  IV and oral hydration to continue as tolerated.SABRA    SIRS criteria present: Patient had fever with tachycardia and leukocytosis.  No obvious source of infection at this time.  Chest x-ray was negative.  Influenza COVID and RSV was negative.  Urinalysis pending.  Likely secondary to viral gastroenteritis.  Continue  supportive care.  Calcitonin less than 0.1.  Follow cultures.   Hypomagnesemia: Initial magnesium  level was 1.5.  Received 2 g of IV magnesium  sulfate.   Magnesium  today at 2.1.    Non-anion gap metabolic acidosis:  Initial bicarbonate of 15 and anion gap 8.  Continue IV fluids.  Check BMP in AM.   Nausea, vomiting, diarrhea: Likely from viral gastroenteritis.  Continue antiemetics IV fluids.  TSH of 0.9.  Type 2 Diabetes Mellitus with peripheral neuropathy.  on Levemir  at home most recent hemoglobin A1c of 5.9.  Presenting blood sugar: 195. Most recent A1c noted to be 5.9%  Glargine 8 units SQ twice daily, first dose now.  Add on hemoglobin A1c level.  Holding home gabapentin  for now in the setting of acute kidney injury superimposed on CKD 3B.    GERD: Pepcid  on hold due to diarrhea.    Essential Hypertension: on  carvedilol , amlodipine  at home.  Continue.    Anemia of chronic disease:  baseline hgb range 10-12.5,   Spoke with the patient's mother and friend at bedside.   No Charge  Signed,  Vernal Anselm Alstrom, MD [facility]

## 2023-03-02 NOTE — Hospital Course (Signed)
 Same day note  Kelli Hudson is a 33 y.o. 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  In the ED, patient was tachycardic and febrile.  Labs showed hypokalemia with potassium of 3.5 with creatinine of 2.6 compared to baseline 1.6.  Magnesium  level slightly low at 1.5.  Lactate was 1.2 and mild leukocytosis at 12, 100. COVID, influenza, RSV PCR were all negative.  EKG showed normal sinus rhythm.  Patient was then considered for admission to the hospital for acute kidney injury secondary to volume depletion with SIRS criteria.   Patient seen and examined at bedside.  Patient was admitted to the hospital for  At the time of my evaluation, patient complains of Physical examination reveals  Laboratory data and imaging was reviewed   Assessment and plan.   Acute Kidney Injury superimposed on CKD 3B: Creatinine around 1.3-1.6, initial creatinine of 2.6.  Repeat creatinine at 2.3, likely secondary to nausea vomiting diarrhea.  Holding gabapentin  at this time.  Check a urinalysis.  Continue IV fluid hydration.     Volume depletion secondary to nausea vomiting diarrhea.  Hydration to continue.    SIRS criteria present: Patient had fever with tachycardia and leukocytosis.  No obvious source of infection at this time.  Chest x-ray was negative.  Influenza COVID and RSV was negative.  Urinalysis pending.  Likely secondary to viral gastroenteritis.  Continue supportive care.  Calcitonin less than 0.1.  Follow cultures.   Hypomagnesemia: Initial magnesium  level was 1.5.  Received 2 g of IV magnesium  sulfate.   Magnesium  today at 2.1.    Non-anion gap metabolic acidosis:   Initial bicarbonate of 15 and anion gap 8.  Continue IV fluids.   Nausea, vomiting, diarrhea: Likely from viral gastroenteritis.  Continue antiemetics IV fluids.  TSH of 0.9.  Type 2 Diabetes Mellitus with peripheral neuropathy.  on Levemir  at home most recent hemoglobin A1c of 5.9.  Presenting blood sugar: 195. Most recent A1c noted to be 5.9%  Glargine 8 units SQ twice daily, first dose now.  Add on hemoglobin A1c level.  Holding home gabapentin  for now in the setting of acute kidney injury superimposed on CKD 3B.    GERD: Pepcid  on hold due to diarrhea.    Essential Hypertension: on  carvedilol , amlodipine  at home.  Continue.    Anemia of chronic disease:  baseline hgb range 10-12.5,    No Charge  Signed,  Vernal Anselm Alstrom, MD [facility]

## 2023-03-02 NOTE — Progress Notes (Signed)
 Hypoglycemic Event  CBG: 41  Treatment: 8 oz juice/soda  Symptoms: Shaky  Follow-up CBG: Time:1729 CBG Result:55  Treatment:  8oz juice, dinner, italian icee  Possible Reasons for Event: Inadequate meal intake  Comments/MD notified: Pokhrel MD    Kelli Hudson

## 2023-03-02 NOTE — ED Notes (Signed)
 ED TO INPATIENT HANDOFF REPORT  Name/Age/Gender Kelli Hudson 33 y.o. female  Code Status    Code Status Orders  (From admission, onward)           Start     Ordered   03/02/23 0253  Full code  Continuous       Question:  By:  Answer:  Consent: discussion documented in EHR   03/02/23 0253           Code Status History     Date Active Date Inactive Code Status Order ID Comments User Context   02/21/2023 1507 02/23/2023 2058 Full Code [id number]  Claudene Maximino LABOR, MD ED   01/21/2023 1217 01/23/2023 1910 Full Code [id number]  Alto Isaiah CROME, NP ED   11/26/2022 1824 12/03/2022 2216 Full Code [id number]  Elgergawy, Brayton RAMAN, MD ED   10/04/2022 0002 11/22/2022 1820 Full Code [id number]  Lonzell Emeline HERO, DO Inpatient   05/05/2022 1547 05/09/2022 1647 Full Code [id number]  Zella Katha HERO, MD ED   04/17/2022 1745 04/21/2022 2003 Full Code [id number]  Barbarann Nest, MD ED   03/21/2022 2049 03/25/2022 1354 Full Code [id number]  Tobie Jorie SAUNDERS, MD Inpatient   03/04/2022 1357 03/06/2022 1816 Full Code [id number]  Jillian Buttery, MD ED   02/16/2022 1336 02/17/2022 2145 Full Code [id number]  Celinda Alm Lot, MD ED   01/04/2022 0212 01/05/2022 2001 Full Code [id number]  Howerter, Eva NOVAK, DO ED   12/25/2021 2035 12/30/2021 1511 Full Code [id number]  Debby Camila LABOR, MD ED   12/07/2021 0838 12/12/2021 1837 Full Code [id number]  Celinda Alm Lot, MD ED   12/02/2021 1311 12/05/2021 1717 Full Code [id number]  Rockey Denece LABOR, DO Inpatient   11/04/2021 2257 11/08/2021 1822 Full Code [id number]  Lonzell Emeline HERO, DO ED   10/09/2021 2219 10/14/2021 1914 Full Code [id number]  Seena Marsa NOVAK, MD ED   10/04/2021 2223 10/07/2021 2243 Full Code [id number]  Rockey Denece LABOR, DO Inpatient   09/21/2021 0452 09/25/2021 1726 Full Code [id number]  Laurence Locus, DO Inpatient   09/21/2021 0334 09/21/2021 0452 Full Code [id number]  Laurence Locus, DO ED   08/24/2021 2352 08/27/2021 2103 Full Code [id number]  Mansy, Madison LABOR, MD ED    08/16/2021 2208 08/19/2021 2046 Full Code [id number]  Shona Terry SAILOR, DO ED   07/29/2021 0909 07/31/2021 1701 Full Code [id number]  Rockey Denece LABOR, DO Inpatient   07/20/2021 2238 07/23/2021 1543 Full Code [id number]  Fairy Frames, MD ED   06/28/2021 1535 07/01/2021 2031 Full Code [id number]  Rockey Denece LABOR, DO ED   06/10/2021 2104 06/14/2021 1623 Full Code [id number]  Ricky Alfrieda DASEN, DO Inpatient   06/04/2021 1715 06/07/2021 1605 Full Code [id number]  Earley Saucer, MD ED   05/28/2021 1111 05/30/2021 1549 Full Code [id number]  Rockey Denece A, DO ED   05/28/2021 0341 05/28/2021 1111 Full Code [id number]  Howerter, Eva NOVAK, DO ED   04/20/2021 2151 04/24/2021 2104 Full Code [id number]  Tobie Jorie SAUNDERS, MD ED   02/24/2021 2320 02/28/2021 2215 Full Code [id number]  Rockey Denece LABOR, DO Inpatient   02/24/2021 1737 02/24/2021 2320 Full Code [id number]  Rockey Denece LABOR, DO ED   11/16/2020 0424 11/17/2020 1428 Full Code [id number]  Lonzell Emeline HERO, DO ED   09/08/2020 2328 09/11/2020 1951 Full Code [id number]  Lonzell Emeline HERO, DO ED   08/12/2020 2304 08/15/2020 1517 Full Code [id number]  Lonzell Emeline HERO, DO ED   07/25/2020 1810 07/28/2020 1545 Full Code [id number]  Rockey,  Tyrone A, DO Inpatient   07/23/2020 2038 07/24/2020 1519 Full Code [id number]  Ricky Pax T, DO ED   06/05/2020 2349 06/09/2020 2026 Full Code [id number]  Hugelmeyer, Alexis, DO ED   05/27/2020 1913 05/28/2020 2248 Full Code [id number]  Sherrill Alejandro Donovan, DO Inpatient   05/09/2020 2149 05/14/2020 2059 Full Code [id number]  Chotiner, Adine RAMAN, MD ED   04/04/2020 1214 04/06/2020 1934 Full Code [id number]  Rockey Denece LABOR, DO ED   02/24/2020 1003 02/27/2020 1622 Full Code [id number]  Rockey Denece LABOR, DO ED   01/25/2020 0113 01/30/2020 1637 Full Code [id number]  Charlton Evalene RAMAN, MD ED   02/21/2018 1904 02/23/2018 2052 Full Code [id number]  Viviana Elveria PARAS, MD ED   02/21/2018 1833 02/21/2018 1904 Full Code [id number]  Viviana Elveria PARAS, MD ED   01/02/2016 0842 01/05/2016 1438 Full Code [id number]  Donnald Delon DASEN, MD  Inpatient   09/02/2015 1729 09/09/2015 2148 Full Code [id number]  Kerman Vina HERO, NP ED       Home/SNF/Other Home  Chief Complaint AKI (acute kidney injury) (HCC) [N17.9]  Level of Care/Admitting Diagnosis ED Disposition     ED Disposition  Admit   Condition  --   Comment  Hospital Area: [facility] [[id number]]  Level of Care: Progressive [102]  Admit to Progressive based on following criteria: MULTISYSTEM THREATS such as stable sepsis, metabolic/electrolyte imbalance with or without encephalopathy that is responding to early treatment.  May admit patient to [facility] or [facility] if equivalent level of care is available:: No  Covid Evaluation: Asymptomatic - no recent exposure (last 10 days) testing not required  Diagnosis: AKI (acute kidney injury) [facility]) [[id number]]  Admitting Physician: HOWERTER, JUSTIN B [[id number]]  Attending Physician: HOWERTER, JUSTIN B [[id number]]  Certification:: I certify this patient will need inpatient services for at least 2 midnights  Expected Medical Readiness: 03/04/2023          Medical History Past Medical History:  Diagnosis Date   Acute H. pylori gastric ulcer    Coffee ground emesis    Diabetes mellitus (HCC)    Diabetic gastroparesis (HCC)    DKA (diabetic ketoacidosis) (HCC) 02/24/2021   Gastroparesis    GERD (gastroesophageal reflux disease)    Hypertension    Hyperthyroidism    Intractable nausea and vomiting 04/20/2021   Normocytic anemia 04/20/2020   Prolonged Q-T interval on ECG     Allergies Allergies  Allergen Reactions   Ibuprofen  Other (See Comments)    07/23/22 - Worsening kidney function after 3 doses of ibuprofen  400mg .  Creatinine increased from 1.3 to 2.  Creatinine returned to normal after stopping.   Lisinopril  Cough, Other (See Comments), Itching and Rash    Breakout in rash    IV Location/Drains/Wounds Patient Lines/Drains/Airways Status     Active Line/Drains/Airways     Name  Placement date Placement time Site Days   Peripheral IV 03/02/23 20 G 1.88 Anterior;Right;Upper Arm 03/02/23  0140  Arm  less than 1            Labs/Imaging Results for orders placed or performed during the hospital encounter of 03/01/23 (from the past 48 hours)  CBG monitoring, ED     Status: Abnormal   Collection Time: 03/01/23  9:56 PM  Result Value Ref Range   Glucose-Capillary 152 (H) 70 - 99 mg/dL    Comment: Glucose reference range applies only to samples taken after fasting for at least 8 hours.  CBC  Status: Abnormal   Collection Time: 03/01/23 11:25 PM  Result Value Ref Range   WBC 12.1 (H) 4.0 - 10.5 K/uL   RBC 3.58 (L) 3.87 - 5.11 MIL/uL   Hemoglobin 10.3 (L) 12.0 - 15.0 g/dL   HCT 66.9 (L) 63.9 - 53.9 %   MCV 92.2 80.0 - 100.0 fL   MCH 28.8 26.0 - 34.0 pg   MCHC 31.2 30.0 - 36.0 g/dL   RDW 84.5 88.4 - 84.4 %   Platelets 339 150 - 400 K/uL   nRBC 0.0 0.0 - 0.2 %    Comment: Performed at [facility], [address], [place], [place] [address]  hCG, serum, qualitative     Status: None   Collection Time: 03/01/23 11:25 PM  Result Value Ref Range   Preg, Serum NEGATIVE NEGATIVE    Comment:        THE SENSITIVITY OF THIS METHODOLOGY IS >10 mIU/mL. Performed at [facility], [address], [place], [place] [address]   Lactic acid, plasma     Status: None   Collection Time: 03/01/23 11:25 PM  Result Value Ref Range   Lactic Acid, Venous 1.2 0.5 - 1.9 mmol/L    Comment: Performed at [facility], [address] [place], [place] [address]  Resp panel by RT-PCR (RSV, Flu A&B, Covid) Anterior Nasal Swab     Status: None   Collection Time: 03/02/23 12:15 AM   Specimen: Anterior Nasal Swab  Result Value Ref Range   SARS Coronavirus 2 by RT PCR NEGATIVE NEGATIVE    Comment: (NOTE) SARS-CoV-2 target nucleic acids are NOT DETECTED.  The SARS-CoV-2 RNA is generally detectable in upper respiratory specimens  during the acute phase of infection. The lowest concentration of SARS-CoV-2 viral copies this assay can detect is 138 copies/mL. A negative result does not preclude SARS-Cov-2 infection and should not be used as the sole basis for treatment or other patient management decisions. A negative result may occur with  improper specimen collection/handling, submission of specimen other than nasopharyngeal swab, presence of viral mutation(s) within the areas targeted by this assay, and inadequate number of viral copies(<138 copies/mL). A negative result must be combined with clinical observations, patient history, and epidemiological information. The expected result is Negative.  Fact Sheet for Patients:  bloggercourse.com  Fact Sheet for Healthcare Providers:  seriousbroker.it  This test is no t yet approved or cleared by the [place]  FDA and  has been authorized for detection and/or diagnosis of SARS-CoV-2 by FDA under an Emergency Use Authorization (EUA). This EUA will remain  in effect (meaning this test can be used) for the duration of the COVID-19 declaration under Section 564(b)(1) of the Act, 21 U.S.C.section 360bbb-3(b)(1), unless the authorization is terminated  or revoked sooner.       Influenza A by PCR NEGATIVE NEGATIVE   Influenza B by PCR NEGATIVE NEGATIVE    Comment: (NOTE) The Xpert Xpress SARS-CoV-2/FLU/RSV plus assay is intended as an aid in the diagnosis of influenza from Nasopharyngeal swab specimens and should not be used as a sole basis for treatment. Nasal washings and aspirates are unacceptable for Xpert Xpress SARS-CoV-2/FLU/RSV testing.  Fact Sheet for Patients: bloggercourse.com  Fact Sheet for Healthcare Providers: seriousbroker.it  This test is not yet approved or cleared by the [place]  FDA and has been authorized for detection and/or diagnosis  of SARS-CoV-2 by FDA under an Emergency Use Authorization (EUA). This EUA will remain in effect (meaning  this test can be used) for the duration of the COVID-19 declaration under Section 564(b)(1) of the Act, 21 U.S.C. section 360bbb-3(b)(1), unless the authorization is terminated or revoked.     Resp Syncytial Virus by PCR NEGATIVE NEGATIVE    Comment: (NOTE) Fact Sheet for Patients: bloggercourse.com  Fact Sheet for Healthcare Providers: seriousbroker.it  This test is not yet approved or cleared by the [place]  FDA and has been authorized for detection and/or diagnosis of SARS-CoV-2 by FDA under an Emergency Use Authorization (EUA). This EUA will remain in effect (meaning this test can be used) for the duration of the COVID-19 declaration under Section 564(b)(1) of the Act, 21 U.S.C. section 360bbb-3(b)(1), unless the authorization is terminated or revoked.  Performed at [facility], [address], [place], [place] [address]   Hepatic function panel     Status: Abnormal   Collection Time: 03/02/23  1:34 AM  Result Value Ref Range   Total Protein 4.7 (L) 6.5 - 8.1 g/dL   Albumin  <1.5 (L) 3.5 - 5.0 g/dL   AST 21 15 - 41 U/L   ALT 20 0 - 44 U/L   Alkaline Phosphatase 83 38 - 126 U/L   Total Bilirubin 0.3 0.0 - 1.2 mg/dL   Bilirubin, Direct <9.8 0.0 - 0.2 mg/dL   Indirect Bilirubin NOT CALCULATED 0.3 - 0.9 mg/dL    Comment: Performed at [facility], [address], [place], [place] [address]  Basic metabolic panel     Status: Abnormal   Collection Time: 03/02/23  1:34 AM  Result Value Ref Range   Sodium 135 135 - 145 mmol/L   Potassium 3.7 3.5 - 5.1 mmol/L   Chloride 112 (H) 98 - 111 mmol/L   CO2 15 (L) 22 - 32 mmol/L   Glucose, Bld 195 (H) 70 - 99 mg/dL    Comment: Glucose reference range applies only to samples taken after fasting for at least 8 hours.   BUN 34 (H) 6 - 20  mg/dL   Creatinine, Ser 7.36 (H) 0.44 - 1.00 mg/dL   Calcium  7.9 (L) 8.9 - 10.3 mg/dL   GFR, Estimated 24 (L) >60 mL/min    Comment: (NOTE) Calculated using the CKD-EPI Creatinine Equation (2021)    Anion gap 8 5 - 15    Comment: Performed at [facility], [address], [place], [place] [address]  Magnesium      Status: Abnormal   Collection Time: 03/02/23  1:34 AM  Result Value Ref Range   Magnesium  1.5 (L) 1.7 - 2.4 mg/dL    Comment: Performed at [facility], [address] [place], [place] [address]  Lactic acid, plasma     Status: None   Collection Time: 03/02/23  2:39 AM  Result Value Ref Range   Lactic Acid, Venous 0.9 0.5 - 1.9 mmol/L    Comment: Performed at [facility], [address] [place], [place] [address]  CBC with Differential/Platelet     Status: Abnormal   Collection Time: 03/02/23  4:44 AM  Result Value Ref Range   WBC 10.5 4.0 - 10.5 K/uL   RBC 3.35 (L) 3.87 - 5.11 MIL/uL   Hemoglobin 9.4 (L) 12.0 - 15.0 g/dL   HCT 69.1 (L) 63.9 - 53.9 %   MCV 91.9 80.0 - 100.0 fL   MCH 28.1 26.0 - 34.0 pg   MCHC 30.5 30.0 - 36.0 g/dL   RDW 84.4 88.4 - 84.4 %  Platelets 304 150 - 400 K/uL   nRBC 0.0 0.0 - 0.2 %   Neutrophils Relative % 89 %   Neutro Abs 9.4 (H) 1.7 - 7.7 K/uL   Lymphocytes Relative 6 %   Lymphs Abs 0.6 (L) 0.7 - 4.0 K/uL   Monocytes Relative 4 %   Monocytes Absolute 0.4 0.1 - 1.0 K/uL   Eosinophils Relative 0 %   Eosinophils Absolute 0.0 0.0 - 0.5 K/uL   Basophils Relative 0 %   Basophils Absolute 0.0 0.0 - 0.1 K/uL   Immature Granulocytes 1 %   Abs Immature Granulocytes 0.11 (H) 0.00 - 0.07 K/uL    Comment: Performed at [facility], [address], [place], [place] [address]  Comprehensive metabolic panel     Status: Abnormal   Collection Time: 03/02/23  4:44 AM  Result Value Ref Range   Sodium 137 135 - 145 mmol/L   Potassium 3.5 3.5 - 5.1 mmol/L   Chloride 117  (H) 98 - 111 mmol/L   CO2 15 (L) 22 - 32 mmol/L   Glucose, Bld 166 (H) 70 - 99 mg/dL    Comment: Glucose reference range applies only to samples taken after fasting for at least 8 hours.   BUN 34 (H) 6 - 20 mg/dL   Creatinine, Ser 7.61 (H) 0.44 - 1.00 mg/dL   Calcium  7.5 (L) 8.9 - 10.3 mg/dL   Total Protein 4.4 (L) 6.5 - 8.1 g/dL   Albumin  <1.5 (L) 3.5 - 5.0 g/dL   AST 20 15 - 41 U/L   ALT 19 0 - 44 U/L   Alkaline Phosphatase 75 38 - 126 U/L   Total Bilirubin 0.4 0.0 - 1.2 mg/dL   GFR, Estimated 27 (L) >60 mL/min    Comment: (NOTE) Calculated using the CKD-EPI Creatinine Equation (2021)    Anion gap 5 5 - 15    Comment: Performed at [facility], [address], [place], [place] [address]  Magnesium      Status: None   Collection Time: 03/02/23  4:44 AM  Result Value Ref Range   Magnesium  2.1 1.7 - 2.4 mg/dL    Comment: Performed at [facility], [address], [place], [place] [address]  Procalcitonin     Status: None   Collection Time: 03/02/23  4:44 AM  Result Value Ref Range   Procalcitonin 1.80 ng/mL    Comment:        Interpretation: PCT > 0.5 ng/mL and <= 2 ng/mL: Systemic infection (sepsis) is possible, but other conditions are known to elevate PCT as well. (NOTE)       Sepsis PCT Algorithm           Lower Respiratory Tract                                      Infection PCT Algorithm    ----------------------------     ----------------------------         PCT < 0.25 ng/mL                PCT < 0.10 ng/mL          Strongly encourage             Strongly discourage   discontinuation of antibiotics    initiation of antibiotics    ----------------------------     -----------------------------  PCT 0.25 - 0.50 ng/mL            PCT 0.10 - 0.25 ng/mL               OR       >80% decrease in PCT            Discourage initiation of                                            antibiotics      Encourage discontinuation           of  antibiotics    ----------------------------     -----------------------------         PCT >= 0.50 ng/mL              PCT 0.26 - 0.50 ng/mL                AND       <80% decrease in PCT             Encourage initiation of                                             antibiotics       Encourage continuation           of antibiotics    ----------------------------     -----------------------------        PCT >= 0.50 ng/mL                  PCT > 0.50 ng/mL               AND         increase in PCT                  Strongly encourage                                      initiation of antibiotics    Strongly encourage escalation           of antibiotics                                     -----------------------------                                           PCT <= 0.25 ng/mL                                                 OR                                        > 80% decrease in PCT  Discontinue / Do not initiate                                             antibiotics  Performed at [facility], [address] [place], [place] [address]   Hemoglobin A1c     Status: Abnormal   Collection Time: 03/02/23  4:44 AM  Result Value Ref Range   Hgb A1c MFr Bld 7.2 (H) 4.8 - 5.6 %    Comment: (NOTE) Pre diabetes:          5.7%-6.4%  Diabetes:              >6.4%  Glycemic control for   <7.0% adults with diabetes    Mean Plasma Glucose 159.94 mg/dL    Comment: Performed at [facility], [address], [place], [place] [address]  TSH     Status: None   Collection Time: 03/02/23  4:44 AM  Result Value Ref Range   TSH 0.931 0.350 - 4.500 uIU/mL    Comment: Performed by a 3rd Generation assay with a functional sensitivity of <=0.01 uIU/mL. Performed at [facility], [address], [place], [place] [address]   Protime-INR     Status: None   Collection Time: 03/02/23  4:44 AM  Result Value Ref Range    Prothrombin Time 14.0 11.4 - 15.2 seconds   INR 1.1 0.8 - 1.2    Comment: (NOTE) INR goal varies based on device and disease states. Performed at [facility], [address], [place], [place] [address]   CBG monitoring, ED     Status: Abnormal   Collection Time: 03/02/23  7:41 AM  Result Value Ref Range   Glucose-Capillary 105 (H) 70 - 99 mg/dL    Comment: Glucose reference range applies only to samples taken after fasting for at least 8 hours.  CBG monitoring, ED     Status: Abnormal   Collection Time: 03/02/23  8:47 AM  Result Value Ref Range   Glucose-Capillary 114 (H) 70 - 99 mg/dL    Comment: Glucose reference range applies only to samples taken after fasting for at least 8 hours.  CBG monitoring, ED     Status: Abnormal   Collection Time: 03/02/23 11:38 AM  Result Value Ref Range   Glucose-Capillary 136 (H) 70 - 99 mg/dL    Comment: Glucose reference range applies only to samples taken after fasting for at least 8 hours.   DG Chest Port 1 View Result Date: 03/02/2023 CLINICAL DATA:  [id number].  Fever.  Near syncopal episode. EXAM: PORTABLE CHEST 1 VIEW COMPARISON:  PA chest 02/21/2023. FINDINGS: The heart size and mediastinal contours are within normal limits. Both lungs are clear. The visualized skeletal structures are unremarkable. IMPRESSION: No active disease. Electronically Signed   By: Francis Quam M.D.   On: 03/02/2023 04:22    Pending Labs Unresulted Labs (From admission, onward)     Start     Ordered   03/02/23 0323  Rapid urine drug screen (hospital performed)  Add-on,   AD        03/02/23 0322   03/02/23 0317  Sodium, urine, random  Add-on,   AD        03/02/23 0317   03/02/23 0317  Creatinine, urine, random  Add-on,   AD  03/02/23 0317   03/02/23 0315  Culture, blood (Routine X 2) w Reflex to ID Panel  BLOOD CULTURE X 2,   R (with TIMED occurrences)      03/02/23 0314   03/01/23 1856  Urinalysis, Routine w reflex microscopic -Urine,  Clean Catch  Once,   URGENT       Question:  Specimen Source  Answer:  Urine, Clean Catch   03/01/23 1855            Vitals/Pain Today's Vitals   03/02/23 0747 03/02/23 0930 03/02/23 1230 03/02/23 1400  BP:  (!) 85/46 116/67 122/80  Pulse:  (!) 111 (!) 103 (!) 102  Resp:  18 18 18   Temp: 99.5 F (37.5 C)     TempSrc: Oral     SpO2:  93% 96% 96%  PainSc:        Isolation Precautions No active isolations  Medications Medications  acetaminophen  (TYLENOL ) tablet 650 mg (has no administration in time range)    Or  acetaminophen  (TYLENOL ) suppository 650 mg (has no administration in time range)  melatonin tablet 3 mg (has no administration in time range)  prochlorperazine  (COMPAZINE ) injection 10 mg (has no administration in time range)  LORazepam  (ATIVAN ) injection 0.5 mg (has no administration in time range)  lactated ringers  infusion ( Intravenous New Bag/Given 03/02/23 0423)  naloxone  (NARCAN ) injection 0.4 mg (has no administration in time range)  insulin  glargine-yfgn (SEMGLEE ) injection 8 Units (8 Units Subcutaneous Given 03/02/23 0912)  insulin  aspart (novoLOG ) injection 0-15 Units (2 Units Subcutaneous Given 03/02/23 1207)  HYDROmorphone  (DILAUDID ) injection 0.5 mg (0.5 mg Intravenous Given 03/02/23 1207)  sodium chloride  0.9 % bolus 1,000 mL (1,000 mLs Intravenous Bolus 03/01/23 2326)  prochlorperazine  (COMPAZINE ) injection 10 mg (10 mg Intramuscular Given 03/01/23 2314)  diphenhydrAMINE  (BENADRYL ) injection 25 mg (25 mg Intramuscular Given 03/01/23 2319)  HYDROmorphone  (DILAUDID ) injection 1 mg (1 mg Intravenous Given 03/02/23 0139)  acetaminophen  (TYLENOL ) tablet 650 mg (650 mg Oral Given 03/02/23 0230)  sodium chloride  0.9 % bolus 1,000 mL (1,000 mLs Intravenous Bolus 03/02/23 0317)  magnesium  sulfate IVPB 2 g 50 mL (0 g Intravenous Stopped 03/02/23 0425)  HYDROmorphone  (DILAUDID ) injection 0.5 mg (0.5 mg Intravenous Given 03/02/23 0440)    Mobility non-ambulatory

## 2023-03-03 DIAGNOSIS — N179 Acute kidney failure, unspecified: Secondary | ICD-10-CM | POA: Diagnosis not present

## 2023-03-03 DIAGNOSIS — N1832 Chronic kidney disease, stage 3b: Secondary | ICD-10-CM | POA: Diagnosis not present

## 2023-03-03 LAB — BASIC METABOLIC PANEL
Anion gap: 7 (ref 5–15)
BUN: 31 mg/dL — ABNORMAL HIGH (ref 6–20)
CO2: 17 mmol/L — ABNORMAL LOW (ref 22–32)
Calcium: 8.1 mg/dL — ABNORMAL LOW (ref 8.9–10.3)
Chloride: 111 mmol/L (ref 98–111)
Creatinine, Ser: 1.95 mg/dL — ABNORMAL HIGH (ref 0.44–1.00)
GFR, Estimated: 35 mL/min — ABNORMAL LOW (ref >60)
Glucose, Bld: 97 mg/dL (ref 70–99)
Potassium: 3.8 mmol/L (ref 3.5–5.1)
Sodium: 135 mmol/L (ref 135–145)

## 2023-03-03 LAB — URINALYSIS, COMPLETE (UACMP) WITH MICROSCOPIC
Bilirubin Urine: NEGATIVE
Glucose, UA: 50 mg/dL — AB
Ketones, ur: NEGATIVE mg/dL
Leukocytes,Ua: NEGATIVE
Nitrite: NEGATIVE
Protein, ur: >=300 mg/dL — AB
Specific Gravity, Urine: 1.01 (ref 1.005–1.030)
pH: 5 (ref 5.0–8.0)

## 2023-03-03 LAB — GLUCOSE, CAPILLARY
Glucose-Capillary: 75 mg/dL (ref 70–99)
Glucose-Capillary: 86 mg/dL (ref 70–99)
Glucose-Capillary: 95 mg/dL (ref 70–99)
Glucose-Capillary: 74 mg/dL (ref 70–99)

## 2023-03-03 MED ORDER — INSULIN ASPART 100 UNIT/ML IJ SOLN: 0.0000 [IU] | Freq: Three times a day (TID) | INTRAMUSCULAR | Status: DC

## 2023-03-03 MED ORDER — INSULIN GLARGINE-YFGN 100 UNIT/ML ~~LOC~~ SOLN
6.0000 [IU] | Freq: Two times a day (BID) | SUBCUTANEOUS | Status: DC
  Administered 2023-03-03 – 2023-03-05 (×5): 6 [IU] via SUBCUTANEOUS
  Filled 2023-03-03 (×6): qty 0.06

## 2023-03-03 NOTE — TOC Initial Note (Signed)
 Transition of Care [facility]) - Initial/Assessment Note   Patient Details  Name: Kelli Hudson MRN: [id number] Date of Birth: 30-Dec-1990  Transition of Care [facility]) CM/SW Contact:    Duwaine GORMAN Aran, LCSW Phone Number: 03/03/2023, 11:41 AM  Clinical Narrative: Patient is from home and has bilateral BKA. CSW confirmed with RN that patient does not have her wheelchair with her in her room, so patient will need PTAR at discharge. TOC following for discharge needs.               Expected Discharge Plan: Home/Self Care Barriers to Discharge: Continued Medical Work up  Patient Goals and CMS Choice Choice offered to / list presented to : NA  Expected Discharge Plan and Services In-house Referral: Clinical Social Work Post Acute Care Choice: NA Living arrangements for the past 2 months: Apartment             DME Arranged: N/A DME Agency: NA  Prior Living Arrangements/Services Living arrangements for the past 2 months: Apartment Lives with:: Self Patient language and need for interpreter reviewed:: Yes Do you feel safe going back to the place where you live?: Yes      Need for Family Participation in Patient Care: Yes (Comment) Care giver support system in place?: Yes (comment) Criminal Activity/Legal Involvement Pertinent to Current Situation/Hospitalization: No - Comment as needed  Activities of Daily Living ADL Screening (condition at time of admission) Independently performs ADLs?: Yes (appropriate for developmental age) Is the patient deaf or have difficulty hearing?: No Does the patient have difficulty seeing, even when wearing glasses/contacts?: Yes Does the patient have difficulty concentrating, remembering, or making decisions?: No  Emotional Assessment Orientation: : Oriented to Self, Oriented to Place, Oriented to  Time, Oriented to Situation Alcohol / Substance Use: Not Applicable Psych Involvement: No (comment)  Admission diagnosis:  Lightheadedness [R42] AKI (acute  kidney injury) (HCC) [N17.9] Patient Active Problem List   Diagnosis Date Noted   SIRS (systemic inflammatory response syndrome) (HCC) 03/02/2023   Fever 03/02/2023   Metabolic acidosis 03/02/2023   Hypertensive urgency 02/21/2023   Chronic pain 02/21/2023   Abscess of thigh 11/28/2022   C. 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   Hypokalemia 11/27/2022   Effusion, left knee 11/05/2022   MDD (major depressive disorder), recurrent episode, moderate (HCC) 10/16/2022   Necrotizing fasciitis of pelvic region and thigh (HCC) 10/08/2022   Necrotizing fasciitis of lower leg (HCC) 10/08/2022   Necrotizing fasciitis (HCC) 10/08/2022   Sepsis due to cellulitis (HCC) 10/03/2022   Cellulitis of lower extremity 10/03/2022   Multinodular goiter 06/18/2022   Malnutrition of moderate degree 04/18/2022   Intractable vomiting with nausea 04/17/2022   DKA (diabetic ketoacidosis) (HCC) 03/04/2022   Nausea vomiting and diarrhea 12/25/2021   Diabetic gastroparesis (HCC) 10/09/2021   Drug-seeking behavior 09/21/2021   Essential hypertension 08/25/2021   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) 08/25/2021   GERD without esophagitis 08/25/2021   Hematemesis    Prolonged Q-T interval on ECG    Nausea and vomiting 07/20/2021   Anemia of chronic disease 06/06/2021   Dehydration 06/04/2021   Hypomagnesemia 04/21/2021   Ulcerated, foot, left, with fat layer exposed (HCC) 04/20/2021   Uncontrolled type 1 diabetes mellitus with hyperglycemia, with long-term current use of insulin  (HCC) 12/18/2020   DM type 1 (diabetes mellitus, type 1) (HCC) 12/18/2020   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) 11/09/2020   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) 09/24/2020   Diabetic gastroparesis associated with type 1 diabetes mellitus (HCC) 08/12/2020   Acute renal failure superimposed on stage  3b chronic kidney disease (HCC) 07/25/2020   Generalized abdominal pain 07/23/2020   GERD (gastroesophageal reflux disease) 07/23/2020   Diabetic nephropathy associated with type 1 diabetes mellitus (HCC) 06/27/2020   Sepsis (HCC) 05/27/2020   HLD (hyperlipidemia) 05/23/2020   Sinus tachycardia 05/09/2020   Anemia 04/20/2020   Depression with anxiety 07/05/2019   Diabetic polyneuropathy associated with type 1 diabetes mellitus (HCC) 09/24/2017   Gastroparesis    Goiter 10/05/2009   DM2 (diabetes mellitus, type 2) (HCC) 10/05/2009   Hypertension associated with diabetes (HCC) 10/05/2009   PCP:  Arloa Jarvis, NP Pharmacy:   [id number] - [place], [place] - [address] [place] [address] Phone: [phone] Fax: [phone]  [id number] - [place], [place] - [address] [place] [address] Phone: [phone] Fax: [phone]  [place] [facility] [address] [place] [address] Phone: [phone] Fax: [phone]  [facility] [place] [facility] [address] [place] [address] Phone: [phone] Fax: [phone]  Social Drivers of Health (SDOH) Social History: SDOH Screenings   Food Insecurity: No Food Insecurity (03/02/2023)  Recent Concern: Food Insecurity - Food Insecurity Present (12/29/2022)   Received from [facility]  Housing: High Risk (03/02/2023)  Transportation Needs: No Transportation Needs (03/02/2023)  Utilities: Not At Risk (03/02/2023)  Alcohol Screen: Low Risk  (05/29/2021)  Depression (PHQ2-9): Low Risk  (05/29/2021)  Recent Concern: Depression (PHQ2-9) - Medium Risk (04/10/2021)  Financial Resource Strain: Low Risk  (02/13/2022)   Received from [facility]  Social Connections: Moderately Integrated (02/21/2023)  Stress: No Stress Concern Present (12/29/2022)   Received from [facility]   Tobacco Use: Low Risk  (03/02/2023)   SDOH Interventions:    Readmission Risk Interventions    03/03/2023   11:39 AM 11/27/2022    2:33 PM 05/08/2022    9:45 AM  Readmission Risk Prevention Plan  Transportation Screening Complete Complete Complete  PCP or Specialist Appt within 3-5 Days  Not Complete   HRI or Home Care Consult  Complete   Social Work Consult for Recovery Care Planning/Counseling  Complete   Palliative Care Screening  Not Applicable   Medication Review Oceanographer) Complete Complete Complete  PCP or Specialist appointment within 3-5 days of discharge   Complete  HRI or Home Care Consult Complete  Complete  SW Recovery Care/Counseling Consult Complete  Complete  Palliative Care Screening Not Applicable  Not Applicable  Skilled Nursing Facility Not Applicable  Not Applicable

## 2023-03-03 NOTE — Inpatient Diabetes Management (Signed)
 Inpatient Diabetes Program Recommendations  AACE/ADA: New Consensus Statement on Inpatient Glycemic Control (2015)  Target Ranges:  Prepandial:   less than 140 mg/dL      Peak postprandial:   less than 180 mg/dL (1-2 hours)      Critically ill patients:  140 - 180 mg/dL   Lab Results  Component Value Date   GLUCAP 75 03/03/2023   HGBA1C 7.2 (H) 03/02/2023    Review of Glycemic Control  Diabetes history: DM1 Outpatient Diabetes medications: Levemir  10 BID, Novolog  10-30 TID Current orders for Inpatient glycemic control: Semglee  6 BID, Novolog  0-9 TID with meals  Has Dexcom CGM HgbA1C - 7.2%  Inpatient Diabetes Program Recommendations:    Agree with decrease in both Semglee  and Novolog .  If post-prandials > 180 mg/dL, add Novolog  2 units TID  Will follow.  Thank you. Shona Brandy, RD, LDN, CDCES Inpatient Diabetes Coordinator [phone]

## 2023-03-03 NOTE — Progress Notes (Signed)
 Chaplain met with Eun to discuss HCPOA. Her mother was the one requesting this.  Chaplain checked to ensure that Anyeli wanted her mother to be her HCPOA and she said yes. Given that Monet is not married, nor does she have children and her father is deceased, chaplain explained that her mother would be considered her legal next of kin in the state of [place].  Because of this, the paperwork is not strictly necessary, but chaplain provided the paperwork for Azaliyah to review with her mother when she is at the hospital.  If further questions arise, please page us  at [phone].   [address], Bcc Pager, [phone]

## 2023-03-03 NOTE — Progress Notes (Signed)
 PROGRESS NOTE  Kelli Hudson FMW:[id number] DOB: 01/11/1991 DOA: 03/01/2023 PCP: Arloa Jarvis, NP   LOS: 1 day   Brief narrative:  Kelli Hudson is a 33 y.o. 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  In the ED, patient was tachycardic and febrile.  Labs showed hypokalemia with potassium of 3.5 with creatinine of 2.6 compared to baseline 1.6.  Magnesium  level slightly low at 1.5.  Lactate was 1.2 and mild leukocytosis at 12, 100. COVID, influenza, RSV PCR were all negative.  EKG showed normal sinus rhythm.  Patient was then considered for admission to the hospital for acute kidney injury secondary to volume depletion with SIRS criteria.   Assessment and plan.   Acute Kidney Injury superimposed on CKD 3B: Creatinine around 1.3-1.6, initial creatinine of 2.6.  Repeat creatinine at 2.3, likely secondary to nausea vomiting diarrhea.  Holding gabapentin  at this time.  Continue IV hydration.  BMP from today pending.  Check urine analysis   Volume depletion secondary to nausea vomiting diarrhea.  Hydration to continue.  States that her diarrhea has slowed down.  No nausea or vomiting.   SIRS criteria : Patient had fever with tachycardia and leukocytosis on presentation..  No obvious source of infection at this time.  Chest x-ray was negative.  Influenza COVID and RSV was negative.   Likely secondary to viral gastroenteritis.  Continue supportive care.  Calcitonin less than 0.1.  Follow cultures.   Hypomagnesemia: Initial magnesium  level was 1.5.  Received 2 g of IV magnesium  sulfate.   Magnesium  today at 2.1.    Non-anion gap metabolic acidosis:  Initial  bicarbonate of 15 and anion gap 8.  Continue IV fluids.  Anion gap last was 15.  Check BMP from today.   Nausea, vomiting, diarrhea: Likely from viral gastroenteritis.  Continue antiemetics, IV fluids.  TSH of 0.9.  Check BMP from today.  Continue symptomatic care.  Blood cultures negative in less than 24 hours.  Will check C. difficile as well.  Type 2 Diabetes Mellitus with peripheral neuropathy.   Hypoglycemia.  Blood glucose level up to 55 today.    On Levemir  at home most recent hemoglobin A1c of 5.9.  Presenting blood sugar: 195. Most recent A1c noted to be 5.9% patient is on glargine 8 units SQ twice daily, at home.  Was hypoglycemic today.  Will decrease long-acting insulin  to 60 units twice daily and decrease to low intensity sliding scale today.  Holding home gabapentin  for now in the setting of acute kidney injury superimposed on CKD 3B.    GERD: Pepcid  on hold due to diarrhea.    Essential Hypertension: on  carvedilol , amlodipine  at home.  Continue medication, closely monitor.    Anemia of chronic disease:  baseline hgb range 10-12.5, last hemoglobin of 9.4.    DVT prophylaxis: SCDs Start: 03/02/23 0253   Disposition: Home likely by 03/04/2023 if continues to improve.  Status is: Inpatient  Remains inpatient appropriate because: AKI, pending GI tolerance, pending clinical improvement    Code Status:     Code Status: Full Code  Family Communication: Spoke with the patient's mother at bedside 03/02/2023  Consultants: None  Procedures: None  Anti-infectives:  None  Anti-infectives (From admission, onward)    None  Subjective: Today, patient was seen and examined at bedside.  Patient states that her diarrhea has slowed down.  Has not had more vomiting but still has some nausea and abdominal discomfort.  Denies any urinary urgency frequency dysuria.  Objective: Vitals:   03/03/23 0056 03/03/23 0412  BP: 114/69 (!) 148/82  Pulse: 100 97  Resp: 18 18   Temp: 98.5 F (36.9 C) 98 F (36.7 C)  SpO2: 100% 100%    Intake/Output Summary (Last 24 hours) at 03/03/2023 1115 Last data filed at 03/03/2023 9077 Gross per 24 hour  Intake 1440 ml  Output --  Net 1440 ml   Filed Weights   03/02/23 1900 03/03/23 0412  Weight: 66.6 kg 66.7 kg   Body mass index is 22.36 kg/m.   Physical Exam:  GENERAL: Patient is alert awake and oriented. Not in obvious distress. HENT: No scleral pallor or icterus. Pupils equally reactive to light. Oral mucosa is slightly dry NECK: is supple, no gross swelling noted. CHEST: Clear to auscultation. No crackles or wheezes.  Diminished breath sounds bilaterally. CVS: S1 and S2 heard, no murmur. Regular rate and rhythm.  ABDOMEN: Soft,, nonspecific tenderness on palpation, bowel sounds are present. EXTREMITIES: Bilateral above-knee amputation noted CNS: Cranial nerves are intact.  Moves upper extremities SKIN: warm and dry without rashes.  Data Review: I have personally reviewed the following laboratory data and studies,  CBC: Recent Labs  Lab 02/27/23 0034 03/01/23 2325 03/02/23 0444  WBC 9.4 12.1* 10.5  NEUTROABS  --   --  9.4*  HGB 12.6 10.3* 9.4*  HCT 39.5 33.0* 30.8*  MCV 88.2 92.2 91.9  PLT 490* 339 304   Basic Metabolic Panel: Recent Labs  Lab 02/27/23 0034 03/02/23 0134 03/02/23 0444  NA 131* 135 137  K 2.7* 3.7 3.5  CL 100 112* 117*  CO2 20* 15* 15*  GLUCOSE 317* 195* 166*  BUN 23* 34* 34*  CREATININE 1.65* 2.63* 2.38*  CALCIUM  8.3* 7.9* 7.5*  MG 1.9 1.5* 2.1   Liver Function Tests: Recent Labs  Lab 02/27/23 0034 03/02/23 0134 03/02/23 0444  AST 14* 21 20  ALT 14 20 19   ALKPHOS 132* 83 75  BILITOT 1.0 0.3 0.4  PROT 6.2* 4.7* 4.4*  ALBUMIN  2.0* <1.5* <1.5*   Recent Labs  Lab 02/27/23 0034  LIPASE 20   No results for input(s): AMMONIA in the last 168 hours. Cardiac Enzymes: No results for input(s): CKTOTAL, CKMB, CKMBINDEX, TROPONINI in the last 168  hours. BNP (last 3 results) Recent Labs    11/30/22 0754 12/01/22 0736 12/02/22 0312  BNP 1,098.0* 1,207.2* 1,252.9*    ProBNP (last 3 results) No results for input(s): PROBNP in the last 8760 hours.  CBG: Recent Labs  Lab 03/02/23 1729 03/02/23 1752 03/02/23 1826 03/02/23 2056 03/03/23 0735  GLUCAP 55* 73 91 94 75   Recent Results (from the past 240 hours)  Resp panel by RT-PCR (RSV, Flu A&B, Covid) Anterior Nasal Swab     Status: None   Collection Time: 03/02/23 12:15 AM   Specimen: Anterior Nasal Swab  Result Value Ref Range Status   SARS Coronavirus 2 by RT PCR NEGATIVE NEGATIVE Final    Comment: (NOTE) SARS-CoV-2 target nucleic acids are NOT DETECTED.  The SARS-CoV-2 RNA is generally detectable in upper respiratory specimens during the acute phase of infection. The lowest concentration of SARS-CoV-2 viral copies this assay can detect is 138 copies/mL. A negative result does not preclude SARS-Cov-2 infection and should not  be used as the sole basis for treatment or other patient management decisions. A negative result may occur with  improper specimen collection/handling, submission of specimen other than nasopharyngeal swab, presence of viral mutation(s) within the areas targeted by this assay, and inadequate number of viral copies(<138 copies/mL). A negative result must be combined with clinical observations, patient history, and epidemiological information. The expected result is Negative.  Fact Sheet for Patients:  bloggercourse.com  Fact Sheet for Healthcare Providers:  seriousbroker.it  This test is no t yet approved or cleared by the [place]  FDA and  has been authorized for detection and/or diagnosis of SARS-CoV-2 by FDA under an Emergency Use Authorization (EUA). This EUA will remain  in effect (meaning this test can be used) for the duration of the COVID-19 declaration under Section 564(b)(1)  of the Act, 21 U.S.C.section 360bbb-3(b)(1), unless the authorization is terminated  or revoked sooner.       Influenza A by PCR NEGATIVE NEGATIVE Final   Influenza B by PCR NEGATIVE NEGATIVE Final    Comment: (NOTE) The Xpert Xpress SARS-CoV-2/FLU/RSV plus assay is intended as an aid in the diagnosis of influenza from Nasopharyngeal swab specimens and should not be used as a sole basis for treatment. Nasal washings and aspirates are unacceptable for Xpert Xpress SARS-CoV-2/FLU/RSV testing.  Fact Sheet for Patients: bloggercourse.com  Fact Sheet for Healthcare Providers: seriousbroker.it  This test is not yet approved or cleared by the [place]  FDA and has been authorized for detection and/or diagnosis of SARS-CoV-2 by FDA under an Emergency Use Authorization (EUA). This EUA will remain in effect (meaning this test can be used) for the duration of the COVID-19 declaration under Section 564(b)(1) of the Act, 21 U.S.C. section 360bbb-3(b)(1), unless the authorization is terminated or revoked.     Resp Syncytial Virus by PCR NEGATIVE NEGATIVE Final    Comment: (NOTE) Fact Sheet for Patients: bloggercourse.com  Fact Sheet for Healthcare Providers: seriousbroker.it  This test is not yet approved or cleared by the [place]  FDA and has been authorized for detection and/or diagnosis of SARS-CoV-2 by FDA under an Emergency Use Authorization (EUA). This EUA will remain in effect (meaning this test can be used) for the duration of the COVID-19 declaration under Section 564(b)(1) of the Act, 21 U.S.C. section 360bbb-3(b)(1), unless the authorization is terminated or revoked.  Performed at [facility], [address] [place], [place] [address]   Culture, blood (Routine X 2) w Reflex to ID Panel     Status: None (Preliminary result)   Collection Time:  03/02/23  4:44 AM   Specimen: BLOOD  Result Value Ref Range Status   Specimen Description   Final    BLOOD RIGHT ANTECUBITAL Performed at [facility], [address] [place], [place] [address]    Special Requests   Final    BOTTLES DRAWN AEROBIC AND ANAEROBIC Blood Culture results may not be optimal due to an inadequate volume of blood received in culture bottles Performed at [facility], [address], [place], [place] [address]    Culture   Final    NO GROWTH < 24 HOURS Performed at [facility], [address] [place], [place] [address]    Report Status PENDING  Incomplete  Culture, blood (Routine X 2) w Reflex to ID Panel     Status: None (Preliminary result)   Collection Time: 03/02/23  6:28 PM   Specimen: BLOOD RIGHT HAND  Result Value  Ref Range Status   Specimen Description   Final    BLOOD RIGHT HAND Performed at [facility], [address] [place], [place] [address]    Special Requests   Final    BOTTLES DRAWN AEROBIC ONLY Blood Culture results may not be optimal due to an inadequate volume of blood received in culture bottles Performed at [facility], [address], [place], [place] [address]    Culture   Final    NO GROWTH < 12 HOURS Performed at [facility], [address] [place], [place] [address]    Report Status PENDING  Incomplete     Studies: DG Chest Port 1 View Result Date: 03/02/2023 CLINICAL DATA:  [id number].  Fever.  Near syncopal episode. EXAM: PORTABLE CHEST 1 VIEW COMPARISON:  PA chest 02/21/2023. FINDINGS: The heart size and mediastinal contours are within normal limits. Both lungs are clear. The visualized skeletal structures are unremarkable. IMPRESSION: No active disease. Electronically Signed   By: Francis Quam M.D.   On: 03/02/2023 04:22      Vernal Alstrom, MD  [facility] 03/03/2023  If 7PM-7AM, please contact night-coverage

## 2023-03-03 NOTE — Plan of Care (Signed)
  Problem: Nutritional: Goal: Maintenance of adequate nutrition will improve Outcome: Progressing   Problem: Skin Integrity: Goal: Risk for impaired skin integrity will decrease Outcome: Progressing   Problem: Education: Goal: Knowledge of General Education information will improve Description: Including pain rating scale, medication(s)/side effects and non-pharmacologic comfort measures Outcome: Progressing   Problem: Clinical Measurements: Goal: Respiratory complications will improve Outcome: Progressing Goal: Cardiovascular complication will be avoided Outcome: Progressing   Problem: Coping: Goal: Level of anxiety will decrease Outcome: Progressing

## 2023-03-03 NOTE — Progress Notes (Addendum)
 Same day note  Kelli Hudson is a 33 y.o. 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  In the ED, patient was tachycardic and febrile.  Labs showed hypokalemia with potassium of 3.5 with creatinine of 2.6 compared to baseline 1.6.  Magnesium  level slightly low at 1.5.  Lactate was 1.2 and mild leukocytosis at 12, 100. COVID, influenza, RSV PCR were all negative.  EKG showed normal sinus rhythm.  Patient was then considered for admission to the hospital for acute kidney injury secondary to volume depletion with SIRS criteria.   Patient seen and examined at bedside.  Patient was admitted to the hospital for nausea vomiting diarrhea.  At the time of my evaluation, patient complains of less nausea and vomiting and was able to tolerate some clears.  Has been having some loose stools.  Physical examination reveals alert awake and Communicative.  Bilateral AKA.  Nonspecific abdominal tenderness.  Mucosa dry.  Laboratory data and imaging was reviewed   Assessment and plan.   Acute Kidney Injury superimposed on CKD 3B: Creatinine around 1.3-1.6, initial creatinine of 2.6.  Repeat creatinine at 2.3, likely secondary to nausea vomiting diarrhea.  Holding gabapentin  at this time.  Check a urinalysis.  Continue IV fluid hydration.     Volume depletion secondary to nausea vomiting diarrhea.  IV and oral hydration to continue as tolerated.SABRA    SIRS criteria present: Patient had fever with tachycardia and leukocytosis.  No obvious source of infection at this time.  Chest x-ray was negative.  Influenza COVID and RSV was negative.  Urinalysis pending.  Likely secondary to viral  gastroenteritis.  Continue supportive care.  Calcitonin less than 0.1.  Follow cultures.   Hypomagnesemia: Initial magnesium  level was 1.5.  Received 2 g of IV magnesium  sulfate.   Magnesium  today at 2.1.    Non-anion gap metabolic acidosis:  Initial bicarbonate of 15 and anion gap 8.  Continue IV fluids.  Check BMP in AM.   Nausea, vomiting, diarrhea: Likely from viral gastroenteritis.  Continue antiemetics IV fluids.  TSH of 0.9.  Type 2 Diabetes Mellitus with peripheral neuropathy.  on Levemir  at home most recent hemoglobin A1c of 5.9.  Presenting blood sugar: 195. Most recent A1c noted to be 5.9%  Glargine 8 units SQ twice daily, first dose now.  Add on hemoglobin A1c level.  Holding home gabapentin  for now in the setting of acute kidney injury superimposed on CKD 3B.    GERD: Pepcid  on hold due to diarrhea.    Essential Hypertension: on  carvedilol , amlodipine  at home.  Continue.    Anemia of chronic disease:  baseline hgb range 10-12.5,   Spoke with the patient's mother and friend at bedside.   No Charge  Signed,  Vernal Anselm Alstrom, MD [facility]

## 2023-03-04 DIAGNOSIS — R197 Diarrhea, unspecified: Secondary | ICD-10-CM | POA: Diagnosis not present

## 2023-03-04 DIAGNOSIS — N179 Acute kidney failure, unspecified: Secondary | ICD-10-CM | POA: Diagnosis not present

## 2023-03-04 DIAGNOSIS — N1832 Chronic kidney disease, stage 3b: Secondary | ICD-10-CM | POA: Diagnosis not present

## 2023-03-04 DIAGNOSIS — R112 Nausea with vomiting, unspecified: Secondary | ICD-10-CM | POA: Diagnosis not present

## 2023-03-04 LAB — GLUCOSE, CAPILLARY
Glucose-Capillary: 78 mg/dL (ref 70–99)
Glucose-Capillary: 71 mg/dL (ref 70–99)
Glucose-Capillary: 93 mg/dL (ref 70–99)
Glucose-Capillary: 102 mg/dL — ABNORMAL HIGH (ref 70–99)

## 2023-03-04 LAB — BASIC METABOLIC PANEL
Anion gap: 8 (ref 5–15)
BUN: 25 mg/dL — ABNORMAL HIGH (ref 6–20)
CO2: 17 mmol/L — ABNORMAL LOW (ref 22–32)
Calcium: 8.6 mg/dL — ABNORMAL LOW (ref 8.9–10.3)
Chloride: 111 mmol/L (ref 98–111)
Creatinine, Ser: 1.68 mg/dL — ABNORMAL HIGH (ref 0.44–1.00)
GFR, Estimated: 41 mL/min — ABNORMAL LOW (ref >60)
Glucose, Bld: 83 mg/dL (ref 70–99)
Potassium: 4 mmol/L (ref 3.5–5.1)
Sodium: 136 mmol/L (ref 135–145)

## 2023-03-04 NOTE — Progress Notes (Addendum)
PROGRESS NOTE  Kelli Hudson AVW:[id number] DOB: 1991/02/03 DOA: 03/01/2023 PCP: Cristino Martes, NP   LOS: 2 days   Brief narrative: Kelli Hudson is a 33 y.o. 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  In the ED, patient was tachycardic and febrile.  Labs showed hypokalemia with potassium of 3.5 with creatinine of 2.6 compared to baseline 1.6.  Magnesium level slightly low at 1.5.  Lactate was 1.2 and mild leukocytosis at 12, 100. COVID, influenza, RSV PCR were all negative.  EKG showed normal sinus rhythm.  Patient was then considered for admission to the hospital for acute kidney injury secondary to volume depletion with SIRS criteria.   Assessment and plan.  Acute Kidney Injury superimposed on CKD 3B: Creatinine around 1.3-1.6, initial creatinine of 2.6.   Likely secondary to nausea vomiting diarrhea.  Holding gabapentin at this time.   Patient was started on IV hydration with improvement in creatinine to 1.68 this morning.   UA reviewed.  Many bacteria noted but negative leukocyte and nitrite and 0-5 WBCs.  Patient denied any urinary symptoms.  No indication for antibiotics at this time.  Nausea, vomiting, diarrhea: Likely from viral gastroenteritis.  TSH 0.9.  Given symptomatic treatment.  Seems to be improving.  Diarrhea has subsided.  Still has nausea occasionally.  Will monitor for another day.     Volume depletion  Secondary to nausea vomiting diarrhea.  Hydration to continue.  States that her diarrhea has slowed down.  No nausea or vomiting.  SIRS criteria : Patient had fever with tachycardia and leukocytosis on presentation..   No obvious source of infection at this time.  Chest x-ray was negative.  Influenza COVID and RSV was negative.   Likely secondary to viral gastroenteritis.  Continue supportive care.  Calcitonin less than 0.1.  Follow cultures.   Hypomagnesemia: Supplemented    Non-anion gap metabolic acidosis:  Secondary to diarrhea.  Stable for the most part.    Type 2 Diabetes Mellitus with peripheral neuropathy.   Hypoglycemia.   Leukos levels have stabilized.  Recent HbA1c of 5.9 noted.  Noted to be on SSI and low-dose glargine.  Continue to monitor closely.      GERD: Pepcid on hold due to diarrhea.    Essential Hypertension: on  carvedilol, amlodipine at home.  Continue medication, closely monitor.    Anemia of chronic disease:  baseline hgb range 10-12.5, last hemoglobin of 9.4.  Status post bilateral AKA Followed by Dr. Lajoyce Corners.  Has a follow-up in the next few weeks to consider prosthesis.    DVT prophylaxis: SCDs Start: 03/02/23 0253 Disposition: Anticipate discharge tomorrow Code Status:     Code Status: Full Code  Family Communication: Spoke with patient's mother over the phone  Consultants: None  Procedures: None  Anti-infectives:  None  Anti-infectives (From admission, onward)    None        Subjective: Patient feels better but still nauseated at times.  Diarrhea has resolved.  Denies any abdominal pain.  No shortness of breath.    Objective: Vitals:   03/03/23 2117 03/04/23 0613  BP: (!) 143/95 (!) 141/97  Pulse: 94 88  Resp: 18 18  Temp: 98.1 F (36.7 C) 97.8 F (36.6 C)  SpO2: 100% 100%  Intake/Output Summary (Last 24 hours) at 03/04/2023 1034 Last data filed at 03/04/2023 0742 Gross per 24 hour  Intake 720 ml  Output 2700 ml  Net -1980 ml   Filed Weights   03/02/23 1900 03/03/23 0412 03/04/23 0500  Weight: 66.6 kg 66.7 kg 67.1 kg   Body mass index is 22.49 kg/m.   Physical Exam:  General appearance: Awake alert.  In no distress Resp: Clear  to auscultation bilaterally.  Normal effort Cardio: S1-S2 is normal regular.  No S3-S4.  No rubs murmurs or bruit GI: Abdomen is soft.  Nontender nondistended.  Bowel sounds are present normal.  No masses organomegaly   Data Review: I have personally reviewed the following laboratory data and studies,  CBC: Recent Labs  Lab 02/27/23 0034 03/01/23 2325 03/02/23 0444  WBC 9.4 12.1* 10.5  NEUTROABS  --   --  9.4*  HGB 12.6 10.3* 9.4*  HCT 39.5 33.0* 30.8*  MCV 88.2 92.2 91.9  PLT 490* 339 304   Basic Metabolic Panel: Recent Labs  Lab 02/27/23 0034 03/02/23 0134 03/02/23 0444 03/03/23 1136 03/04/23 0722  NA 131* 135 137 135 136  K 2.7* 3.7 3.5 3.8 4.0  CL 100 112* 117* 111 111  CO2 20* 15* 15* 17* 17*  GLUCOSE 317* 195* 166* 97 83  BUN 23* 34* 34* 31* 25*  CREATININE 1.65* 2.63* 2.38* 1.95* 1.68*  CALCIUM 8.3* 7.9* 7.5* 8.1* 8.6*  MG 1.9 1.5* 2.1  --   --    Liver Function Tests: Recent Labs  Lab 02/27/23 0034 03/02/23 0134 03/02/23 0444  AST 14* 21 20  ALT 14 20 19   ALKPHOS 132* 83 75  BILITOT 1.0 0.3 0.4  PROT 6.2* 4.7* 4.4*  ALBUMIN 2.0* <1.5* <1.5*   Recent Labs  Lab 02/27/23 0034  LIPASE 20     CBG: Recent Labs  Lab 03/03/23 0735 03/03/23 1123 03/03/23 1619 03/03/23 2115 03/04/23 0737  GLUCAP 75 86 95 74 78   Recent Results (from the past 240 hours)  Resp panel by RT-PCR (RSV, Flu A&B, Covid) Anterior Nasal Swab     Status: None   Collection Time: 03/02/23 12:15 AM   Specimen: Anterior Nasal Swab  Result Value Ref Range Status   SARS Coronavirus 2 by RT PCR NEGATIVE NEGATIVE Final    Comment: (NOTE) SARS-CoV-2 target nucleic acids are NOT DETECTED.  The SARS-CoV-2 RNA is generally detectable in upper respiratory specimens during the acute phase of infection. The lowest concentration of SARS-CoV-2 viral copies this assay can detect is 138 copies/mL. A negative result does not preclude SARS-Cov-2 infection and should not be used as the  sole basis for treatment or other patient management decisions. A negative result may occur with  improper specimen collection/handling, submission of specimen other than nasopharyngeal swab, presence of viral mutation(s) within the areas targeted by this assay, and inadequate number of viral copies(<138 copies/mL). A negative result must be combined with clinical observations, patient history, and epidemiological information. The expected result is Negative.  Fact Sheet for Patients:  BloggerCourse.com  Fact Sheet for Healthcare Providers:  SeriousBroker.it  This test is no t yet approved or cleared by the [place] FDA and  has been authorized for detection and/or diagnosis of SARS-CoV-2 by FDA under an Emergency Use Authorization (EUA). This EUA will remain  in effect (meaning this test can be used) for the duration of the COVID-19 declaration under Section 564(b)(1) of the Act, 21 U.S.C.section 360bbb-3(b)(1), unless the authorization is  terminated  or revoked sooner.       Influenza A by PCR NEGATIVE NEGATIVE Final   Influenza B by PCR NEGATIVE NEGATIVE Final    Comment: (NOTE) The Xpert Xpress SARS-CoV-2/FLU/RSV plus assay is intended as an aid in the diagnosis of influenza from Nasopharyngeal swab specimens and should not be used as a sole basis for treatment. Nasal washings and aspirates are unacceptable for Xpert Xpress SARS-CoV-2/FLU/RSV testing.  Fact Sheet for Patients: BloggerCourse.com  Fact Sheet for Healthcare Providers: SeriousBroker.it  This test is not yet approved or cleared by the [place] FDA and has been authorized for detection and/or diagnosis of SARS-CoV-2 by FDA under an Emergency Use Authorization (EUA). This EUA will remain in effect (meaning this test can be used) for the duration of the COVID-19 declaration under Section 564(b)(1) of the  Act, 21 U.S.C. section 360bbb-3(b)(1), unless the authorization is terminated or revoked.     Resp Syncytial Virus by PCR NEGATIVE NEGATIVE Final    Comment: (NOTE) Fact Sheet for Patients: BloggerCourse.com  Fact Sheet for Healthcare Providers: SeriousBroker.it  This test is not yet approved or cleared by the [place] FDA and has been authorized for detection and/or diagnosis of SARS-CoV-2 by FDA under an Emergency Use Authorization (EUA). This EUA will remain in effect (meaning this test can be used) for the duration of the COVID-19 declaration under Section 564(b)(1) of the Act, 21 U.S.C. section 360bbb-3(b)(1), unless the authorization is terminated or revoked.  Performed at [facility], [address] [place], [place] [address]   Culture, blood (Routine X 2) w Reflex to ID Panel     Status: None (Preliminary result)   Collection Time: 03/02/23  4:44 AM   Specimen: BLOOD  Result Value Ref Range Status   Specimen Description   Final    BLOOD RIGHT ANTECUBITAL Performed at [facility], [address], [place], [place] [address]    Special Requests   Final    BOTTLES DRAWN AEROBIC AND ANAEROBIC Blood Culture results may not be optimal due to an inadequate volume of blood received in culture bottles Performed at [facility], [address] [place], [place] [address]    Culture   Final    NO GROWTH 2 DAYS Performed at [facility], [address], [place], [place] [address]    Report Status PENDING  Incomplete  Culture, blood (Routine X 2) w Reflex to ID Panel     Status: None (Preliminary result)   Collection Time: 03/02/23  6:28 PM   Specimen: BLOOD RIGHT HAND  Result Value Ref Range Status   Specimen Description   Final    BLOOD RIGHT HAND Performed at [facility], [address], [place], [place] [address]    Special Requests   Final     BOTTLES DRAWN AEROBIC ONLY Blood Culture results may not be optimal due to an inadequate volume of blood received in culture bottles Performed at [facility], [address], [place], [place] [address]    Culture   Final    NO GROWTH 2 DAYS Performed at [facility], [address] [place], [place] [address]    Report Status PENDING  Incomplete     Studies: No results found.     Osvaldo Shipper, MD  [facility] 03/04/2023  If 7PM-7AM, please contact night-coverage

## 2023-03-04 NOTE — Progress Notes (Signed)
 PHARMACY - PHYSICIAN COMMUNICATION CRITICAL VALUE ALERT - BLOOD CULTURE IDENTIFICATION (BCID)  Kelli Hudson is an 33 y.o. female who presented to [facility] on 03/01/2023 with a chief complaint of headache, syncopal episode.  Assessment:  SIRS criteria on admission however w/u for infection has been negative. Now 1/3 blood cx bottles growing GPR.  Likely contaminant.   Name of physician (or Provider) ContactedEugena Herter, NP  Current antibiotics: none  Changes to prescribed antibiotics recommended:  Continue to observe off antibiotics.  F/U final cx data.  Arie Kurtz PharmD 03/04/2023  11:31 PM

## 2023-03-04 NOTE — Plan of Care (Signed)
  Problem: Clinical Measurements: Goal: Diagnostic test results will improve Outcome: Progressing Goal: Respiratory complications will improve Outcome: Progressing Goal: Cardiovascular complication will be avoided Outcome: Progressing   Problem: Safety: Goal: Ability to remain free from injury will improve Outcome: Progressing

## 2023-03-05 DIAGNOSIS — R197 Diarrhea, unspecified: Secondary | ICD-10-CM | POA: Diagnosis not present

## 2023-03-05 DIAGNOSIS — N1832 Chronic kidney disease, stage 3b: Secondary | ICD-10-CM | POA: Diagnosis not present

## 2023-03-05 DIAGNOSIS — R112 Nausea with vomiting, unspecified: Secondary | ICD-10-CM | POA: Diagnosis not present

## 2023-03-05 DIAGNOSIS — N179 Acute kidney failure, unspecified: Secondary | ICD-10-CM | POA: Diagnosis not present

## 2023-03-05 LAB — BASIC METABOLIC PANEL
Anion gap: 8 (ref 5–15)
BUN: 20 mg/dL (ref 6–20)
CO2: 20 mmol/L — ABNORMAL LOW (ref 22–32)
Calcium: 8.3 mg/dL — ABNORMAL LOW (ref 8.9–10.3)
Chloride: 104 mmol/L (ref 98–111)
Creatinine, Ser: 1.48 mg/dL — ABNORMAL HIGH (ref 0.44–1.00)
GFR, Estimated: 48 mL/min — ABNORMAL LOW (ref >60)
Glucose, Bld: 118 mg/dL — ABNORMAL HIGH (ref 70–99)
Potassium: 4 mmol/L (ref 3.5–5.1)
Sodium: 132 mmol/L — ABNORMAL LOW (ref 135–145)

## 2023-03-05 LAB — CBC
HCT: 29.5 % — ABNORMAL LOW (ref 36.0–46.0)
Hemoglobin: 9.5 g/dL — ABNORMAL LOW (ref 12.0–15.0)
MCH: 28.3 pg (ref 26.0–34.0)
MCHC: 32.2 g/dL (ref 30.0–36.0)
MCV: 87.8 fL (ref 80.0–100.0)
Platelets: 362 10*3/uL (ref 150–400)
RBC: 3.36 MIL/uL — ABNORMAL LOW (ref 3.87–5.11)
RDW: 15.5 % (ref 11.5–15.5)
WBC: 6.1 10*3/uL (ref 4.0–10.5)
nRBC: 0 % (ref 0.0–0.2)

## 2023-03-05 LAB — GLUCOSE, CAPILLARY
Glucose-Capillary: 49 mg/dL — ABNORMAL LOW (ref 70–99)
Glucose-Capillary: 101 mg/dL — ABNORMAL HIGH (ref 70–99)
Glucose-Capillary: 144 mg/dL — ABNORMAL HIGH (ref 70–99)
Glucose-Capillary: 109 mg/dL — ABNORMAL HIGH (ref 70–99)
Glucose-Capillary: 109 mg/dL — ABNORMAL HIGH (ref 70–99)
Glucose-Capillary: 75 mg/dL (ref 70–99)
Glucose-Capillary: 114 mg/dL — ABNORMAL HIGH (ref 70–99)

## 2023-03-05 MED ORDER — METOCLOPRAMIDE HCL 5 MG PO TABS
10.0000 mg | ORAL_TABLET | Freq: Three times a day (TID) | ORAL | Status: DC
  Administered 2023-03-05 (×2): 10 mg via ORAL
  Filled 2023-03-05 (×3): qty 2

## 2023-03-05 MED ORDER — INSULIN GLARGINE-YFGN 100 UNIT/ML ~~LOC~~ SOLN
5.0000 [IU] | Freq: Two times a day (BID) | SUBCUTANEOUS | Status: DC
  Administered 2023-03-05 – 2023-03-06 (×2): 5 [IU] via SUBCUTANEOUS
  Filled 2023-03-05 (×3): qty 0.05

## 2023-03-05 NOTE — Plan of Care (Signed)
  Problem: Metabolic: Goal: Ability to maintain appropriate glucose levels will improve Outcome: Progressing   Problem: Clinical Measurements: Goal: Diagnostic test results will improve Outcome: Progressing Goal: Respiratory complications will improve Outcome: Progressing Goal: Cardiovascular complication will be avoided Outcome: Progressing   Problem: Pain Management: Goal: General experience of comfort will improve Outcome: Progressing   Problem: Safety: Goal: Ability to remain free from injury will improve Outcome: Progressing

## 2023-03-05 NOTE — Plan of Care (Signed)

## 2023-03-05 NOTE — Progress Notes (Signed)
Hypoglycemic Event  CBG: 49  Treatment: 8 oz juice/soda, peanut butter and graham crackers  Symptoms: Shaky  Follow-up CBG: Time:0331 CBG Result:101  Possible Reasons for Event: Vomiting  Comments/MD notified: Virgel Manifold NP    Kerrin Champagne

## 2023-03-05 NOTE — Progress Notes (Addendum)
PROGRESS NOTE  Kelli Hudson:[id number] DOB: 08/04/1990 DOA: 03/01/2023 PCP: Cristino Martes, NP   LOS: 3 days   Brief narrative: Kelli Hudson is a 33 y.o. 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  In the ED, patient was tachycardic and febrile.  Labs showed hypokalemia with potassium of 3.5 with creatinine of 2.6 compared to baseline 1.6.  Magnesium level slightly low at 1.5.  Lactate was 1.2 and mild leukocytosis at 12, 100. COVID, influenza, RSV PCR were all negative.  EKG showed normal sinus rhythm.  Patient was then considered for admission to the hospital for acute kidney injury secondary to volume depletion with SIRS criteria.   Assessment and plan.  Acute Kidney Injury superimposed on CKD 3B: Creatinine around 1.3-1.6, initial creatinine of 2.6.   Likely secondary to nausea vomiting diarrhea.   Patient was given IV hydration with improvement in renal function. UA reviewed.  Many bacteria noted but negative leukocyte and nitrite and 0-5 WBCs.  Patient denied any urinary symptoms.  No indication for antibiotics at this time. Continue to monitor urine output.  Nausea, vomiting, diarrhea:  Mostly due to viral gastroenteritis.  However gastroparesis cannot be ruled out.  Patient tells me that she was on metoclopramide prior to admission but do not see this listed in her home medication list. Her diarrhea has resolved but she continues to have vomiting.  Will start her on metoclopramide here in the hospital.  Continue to monitor for another day.     SIRS criteria : Patient had fever with tachycardia and leukocytosis on  presentation..  No obvious source of infection at this time.  Chest x-ray was negative.  Influenza COVID and RSV was negative.   Likely secondary to viral gastroenteritis.  Continue supportive care.  ProCalcitonin less than 0.1.    Bacteremia Gram-positive rods noted in 1 set of blood cultures.  Likely contaminant.  WBC is normal.  She is afebrile.   Hypomagnesemia: Supplemented    Non-anion gap metabolic acidosis:  Secondary to diarrhea.  Stable for the most part.    Type 2 Diabetes Mellitus with peripheral neuropathy.   Hypoglycemia.   Recent HbA1c of 5.9 noted.  Noted to be on SSI and low-dose glargine.  Had low glucose levels overnight likely due to poor oral intake.  Will cut back on the dose of glargine.    GERD: Pepcid on hold due to diarrhea.    Essential Hypertension: on  carvedilol, amlodipine at home.  Continue medication, closely monitor.    Anemia of chronic disease:   Hemoglobin stable for the most part.  No evidence of overt bleeding.  Mild dilutional drop noted.  Status post bilateral AKA Followed by Dr. Lajoyce Corners.  Has a follow-up in the next few weeks to consider ?prosthesis.    DVT prophylaxis: SCDs Start: 03/02/23 0253 Disposition: Home when vomiting has resolved  Code Status:     Code Status: Full Code  Family Communication: No family at bedside  Consultants: None  Procedures: None    Subjective: Overall she feels better but did have an episode of vomiting after supper last night.  Denies any abdominal pain.  Tells me that she takes metoclopramide 4 times a day but not listed in her home medication list.  Objective: Vitals:   03/04/23 2020 03/05/23 0341  BP: (!) 142/99 136/85  Pulse: 100 99  Resp: 15 20  Temp: 98.4 F (36.9 C) 98 F (36.7 C)  SpO2: 100% 100%    Intake/Output Summary (Last 24 hours) at 03/05/2023 1131 Last data filed at 03/04/2023 2343 Gross per 24 hour  Intake 720 ml  Output 500 ml  Net 220 ml   Filed Weights   03/03/23  0412 03/04/23 0500 03/05/23 0500  Weight: 66.7 kg 67.1 kg 63.7 kg   Body mass index is 21.35 kg/m.   Physical Exam:  General appearance: Awake alert.  In no distress Resp: Clear to auscultation bilaterally.  Normal effort Cardio: S1-S2 is normal regular.  No S3-S4.  No rubs murmurs or bruit GI: Abdomen is soft.  Nontender nondistended.  Bowel sounds are present normal.  No masses organomegaly    Data Review: I have personally reviewed the following laboratory data and studies,  CBC: Recent Labs  Lab 02/27/23 0034 03/01/23 2325 03/02/23 0444 03/05/23 0629  WBC 9.4 12.1* 10.5 6.1  NEUTROABS  --   --  9.4*  --   HGB 12.6 10.3* 9.4* 9.5*  HCT 39.5 33.0* 30.8* 29.5*  MCV 88.2 92.2 91.9 87.8  PLT 490* 339 304 362   Basic Metabolic Panel: Recent Labs  Lab 02/27/23 0034 03/02/23 0134 03/02/23 0444 03/03/23 1136 03/04/23 0722 03/05/23 0359  NA 131* 135 137 135 136 132*  K 2.7* 3.7 3.5 3.8 4.0 4.0  CL 100 112* 117* 111 111 104  CO2 20* 15* 15* 17* 17* 20*  GLUCOSE 317* 195* 166* 97 83 118*  BUN 23* 34* 34* 31* 25* 20  CREATININE 1.65* 2.63* 2.38* 1.95* 1.68* 1.48*  CALCIUM 8.3* 7.9* 7.5* 8.1* 8.6* 8.3*  MG 1.9 1.5* 2.1  --   --   --    Liver Function Tests: Recent Labs  Lab 02/27/23 0034 03/02/23 0134 03/02/23 0444  AST 14* 21 20  ALT 14 20 19   ALKPHOS 132* 83 75  BILITOT 1.0 0.3 0.4  PROT 6.2* 4.7* 4.4*  ALBUMIN 2.0* <1.5* <1.5*   Recent Labs  Lab 02/27/23 0034  LIPASE 20     CBG: Recent Labs  Lab 03/04/23 2043 03/05/23 0254 03/05/23 0331 03/05/23 0532 03/05/23 0757  GLUCAP 102* 49* 101* 144* 109*   Recent Results (from the past 240 hours)  Resp panel by RT-PCR (RSV, Flu A&B, Covid) Anterior Nasal Swab     Status: None   Collection Time: 03/02/23 12:15 AM   Specimen: Anterior Nasal Swab  Result Value Ref Range Status   SARS Coronavirus 2 by RT PCR NEGATIVE NEGATIVE Final    Comment: (NOTE) SARS-CoV-2 target nucleic acids are NOT  DETECTED.  The SARS-CoV-2 RNA is generally detectable in upper respiratory specimens during the acute phase of infection. The lowest concentration of SARS-CoV-2 viral copies this assay can detect is 138 copies/mL. A negative result does not preclude SARS-Cov-2 infection and should not be used as the sole basis for treatment or other patient management decisions. A negative result may occur with  improper specimen collection/handling, submission of specimen other than nasopharyngeal swab, presence of viral mutation(s) within the areas targeted by this assay, and inadequate number of viral copies(<138 copies/mL). A negative result must be combined with clinical observations, patient history, and epidemiological information. The expected result is Negative.  Fact Sheet for Patients:  BloggerCourse.com  Fact Sheet for Healthcare Providers:  SeriousBroker.it  This test is no  t yet approved or cleared by the [place] and  has been authorized for detection and/or diagnosis of SARS-CoV-2 by FDA under an Emergency Use Authorization (EUA). This EUA will remain  in effect (meaning this test can be used) for the duration of the COVID-19 declaration under Section 564(b)(1) of the Act, 21 U.S.C.section 360bbb-3(b)(1), unless the authorization is terminated  or revoked sooner.       Influenza A by PCR NEGATIVE NEGATIVE Final   Influenza B by PCR NEGATIVE NEGATIVE Final    Comment: (NOTE) The Xpert Xpress SARS-CoV-2/FLU/RSV plus assay is intended as an aid in the diagnosis of influenza from Nasopharyngeal swab specimens and should not be used as a sole basis for treatment. Nasal washings and aspirates are unacceptable for Xpert Xpress SARS-CoV-2/FLU/RSV testing.  Fact Sheet for Patients: BloggerCourse.com  Fact Sheet for Healthcare Providers: SeriousBroker.it  This test is not yet  approved or cleared by the [place] FDA and has been authorized for detection and/or diagnosis of SARS-CoV-2 by FDA under an Emergency Use Authorization (EUA). This EUA will remain in effect (meaning this test can be used) for the duration of the COVID-19 declaration under Section 564(b)(1) of the Act, 21 U.S.C. section 360bbb-3(b)(1), unless the authorization is terminated or revoked.     Resp Syncytial Virus by PCR NEGATIVE NEGATIVE Final    Comment: (NOTE) Fact Sheet for Patients: BloggerCourse.com  Fact Sheet for Healthcare Providers: SeriousBroker.it  This test is not yet approved or cleared by the [place] FDA and has been authorized for detection and/or diagnosis of SARS-CoV-2 by FDA under an Emergency Use Authorization (EUA). This EUA will remain in effect (meaning this test can be used) for the duration of the COVID-19 declaration under Section 564(b)(1) of the Act, 21 U.S.C. section 360bbb-3(b)(1), unless the authorization is terminated or revoked.  Performed at [facility], [address] [place], [place] [address]   Culture, blood (Routine X 2) w Reflex to ID Panel     Status: None (Preliminary result)   Collection Time: 03/02/23  4:44 AM   Specimen: BLOOD  Result Value Ref Range Status   Specimen Description   Final    BLOOD RIGHT ANTECUBITAL Performed at [facility], [address], [place], [place] [address]    Special Requests   Final    BOTTLES DRAWN AEROBIC AND ANAEROBIC Blood Culture results may not be optimal due to an inadequate volume of blood received in culture bottles Performed at [facility], [address], [place], [place] [address]    Culture   Final    NO GROWTH 3 DAYS Performed at [facility], [address], [place], [place] [address]    Report Status PENDING  Incomplete  Culture, blood (Routine X 2) w Reflex to ID  Panel     Status: None (Preliminary result)   Collection Time: 03/02/23  6:28 PM   Specimen: BLOOD RIGHT HAND  Result Value Ref Range Status   Specimen Description   Final    BLOOD RIGHT HAND Performed at [facility], [address], [place], [place] [address]    Special Requests   Final    BOTTLES DRAWN AEROBIC ONLY Blood Culture results may not be optimal due to an inadequate volume of blood received in culture bottles Performed at [facility], [address], [place], [place] [address]    Culture  Setup Time   Final  GRAM POSITIVE RODS AEROBIC BOTTLE ONLY CRITICAL RESULT CALLED TO, READ BACK BY AND VERIFIED WITH: PHARMD MICHELLE LILLISTON ON 03/04/23 @ 2309 BY DRT Performed at [facility], [address] [place], [place] [address]    Culture GRAM POSITIVE RODS  Final   Report Status PENDING  Incomplete     Studies: No results found.     Osvaldo Shipper, MD  [facility] 03/05/2023  If 7PM-7AM, please contact night-coverage

## 2023-03-05 NOTE — Inpatient Diabetes Management (Signed)
Inpatient Diabetes Program Recommendations  AACE/ADA: New Consensus Statement on Inpatient Glycemic Control (2015)  Target Ranges:  Prepandial:   less than 140 mg/dL      Peak postprandial:   less than 180 mg/dL (1-2 hours)      Critically ill patients:  140 - 180 mg/dL   Lab Results  Component Value Date   GLUCAP 109 (H) 03/05/2023   HGBA1C 7.2 (H) 03/02/2023    Review of Glycemic Control  Diabetes history: DM1 Outpatient Diabetes medications: Levemir 10 BID, Novolog 10-30 TID Current orders for Inpatient glycemic control: Semglee 6 BID, Novolog 0-9 TID with meals  Has Dexcom CGM HgbA1C - 7.2%  Hypo of 49 at 0300  Inpatient Diabetes Program Recommendations:   Consider decreasing Semglee to 5 units BID  Consider decreasing Novolog to 0-9 TID  Continue to follow.  Thank you. Ailene Ards, RD, LDN, CDCES Inpatient Diabetes Coordinator [phone]

## 2023-03-06 ENCOUNTER — Inpatient Hospital Stay (HOSPITAL_COMMUNITY): Payer: 59

## 2023-03-06 DIAGNOSIS — N179 Acute kidney failure, unspecified: Secondary | ICD-10-CM | POA: Diagnosis not present

## 2023-03-06 DIAGNOSIS — N1832 Chronic kidney disease, stage 3b: Secondary | ICD-10-CM | POA: Diagnosis not present

## 2023-03-06 DIAGNOSIS — R197 Diarrhea, unspecified: Secondary | ICD-10-CM | POA: Diagnosis not present

## 2023-03-06 DIAGNOSIS — R112 Nausea with vomiting, unspecified: Secondary | ICD-10-CM | POA: Diagnosis not present

## 2023-03-06 LAB — GLUCOSE, CAPILLARY
Glucose-Capillary: 92 mg/dL (ref 70–99)
Glucose-Capillary: 73 mg/dL (ref 70–99)
Glucose-Capillary: 59 mg/dL — ABNORMAL LOW (ref 70–99)
Glucose-Capillary: 168 mg/dL — ABNORMAL HIGH (ref 70–99)
Glucose-Capillary: 143 mg/dL — ABNORMAL HIGH (ref 70–99)

## 2023-03-06 LAB — CULTURE, BLOOD (ROUTINE X 2)

## 2023-03-06 MED ORDER — HYDROMORPHONE HCL 1 MG/ML IJ SOLN
0.5000 mg | INTRAMUSCULAR | Status: DC | PRN
  Administered 2023-03-06: 0.5 mg via INTRAVENOUS
  Filled 2023-03-06: qty 0.5

## 2023-03-06 MED ORDER — HYDROMORPHONE HCL 1 MG/ML IJ SOLN
0.5000 mg | Freq: Four times a day (QID) | INTRAMUSCULAR | Status: DC | PRN
  Administered 2023-03-06 – 2023-03-07 (×4): 0.5 mg via INTRAVENOUS
  Filled 2023-03-06 (×4): qty 0.5

## 2023-03-06 MED ORDER — METOCLOPRAMIDE HCL 5 MG/ML IJ SOLN
10.0000 mg | Freq: Four times a day (QID) | INTRAMUSCULAR | Status: DC
  Administered 2023-03-06 – 2023-03-07 (×5): 10 mg via INTRAVENOUS
  Filled 2023-03-06 (×5): qty 2

## 2023-03-06 MED ORDER — OXYCODONE HCL 5 MG PO TABS
5.0000 mg | ORAL_TABLET | Freq: Four times a day (QID) | ORAL | Status: DC | PRN
  Administered 2023-03-06 – 2023-03-07 (×4): 5 mg via ORAL
  Filled 2023-03-06 (×4): qty 1

## 2023-03-06 MED ORDER — PANTOPRAZOLE SODIUM 40 MG IV SOLR
40.0000 mg | Freq: Two times a day (BID) | INTRAVENOUS | Status: AC
  Administered 2023-03-06 (×2): 40 mg via INTRAVENOUS
  Filled 2023-03-06 (×2): qty 10

## 2023-03-06 MED ORDER — PANTOPRAZOLE SODIUM 40 MG PO TBEC
40.0000 mg | DELAYED_RELEASE_TABLET | Freq: Every day | ORAL | Status: DC
  Administered 2023-03-07: 40 mg via ORAL
  Filled 2023-03-06: qty 1

## 2023-03-06 MED ORDER — HYDROXYZINE HCL 10 MG PO TABS
10.0000 mg | ORAL_TABLET | Freq: Three times a day (TID) | ORAL | Status: DC | PRN
  Administered 2023-03-06 – 2023-03-07 (×2): 10 mg via ORAL
  Filled 2023-03-06 (×2): qty 1

## 2023-03-06 NOTE — Plan of Care (Signed)
  Problem: Clinical Measurements: Goal: Diagnostic test results will improve Outcome: Progressing Goal: Respiratory complications will improve Outcome: Progressing Goal: Cardiovascular complication will be avoided Outcome: Progressing   Problem: Safety: Goal: Ability to remain free from injury will improve Outcome: Progressing   

## 2023-03-06 NOTE — Plan of Care (Signed)
  Problem: Health Behavior/Discharge Planning: Goal: Ability to manage health-related needs will improve Outcome: Progressing   Problem: Metabolic: Goal: Ability to maintain appropriate glucose levels will improve Outcome: Progressing   Problem: Skin Integrity: Goal: Risk for impaired skin integrity will decrease Outcome: Progressing   Problem: Nutritional: Goal: Maintenance of adequate nutrition will improve Outcome: Progressing

## 2023-03-06 NOTE — Progress Notes (Addendum)
PROGRESS NOTE  Zona Michalik GNF:[id number] DOB: 07/11/1990 DOA: 03/01/2023 PCP: Cristino Martes, NP   LOS: 4 days   Brief narrative: Jozlynn Schwehr is a 33 y.o. 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  In the ED, patient was tachycardic and febrile.  Labs showed hypokalemia with potassium of 3.5 with creatinine of 2.6 compared to baseline 1.6.  Magnesium level slightly low at 1.5.  Lactate was 1.2 and mild leukocytosis at 12, 100. COVID, influenza, RSV PCR were all negative.  EKG showed normal sinus rhythm.  Patient was then considered for admission to the hospital for acute kidney injury secondary to volume depletion with SIRS criteria.   Assessment and plan.  Acute Kidney Injury superimposed on CKD 3B: Creatinine around 1.3-1.6, initial creatinine of 2.6.   Likely secondary to nausea vomiting diarrhea.   Patient was given IV hydration with improvement in renal function. UA reviewed.  Many bacteria noted but negative leukocyte and nitrite and 0-5 WBCs.  Patient denied any urinary symptoms.  No indication for antibiotics at this time. Renal function improved.  Continue to monitor urine output.  Recheck labs tomorrow. Will obtain abdominal film.  Nausea, vomiting, diarrhea/history of gastroparesis Mostly due to viral gastroenteritis.  However gastroparesis cannot be ruled out.  Patient tells me that she was on metoclopramide prior to admission but do not see this listed in her home medication list. Diarrhea has resolved.  Continues to have episodes of nausea and vomiting.  Will change metoclopramide to intravenous for 24 hours.  Abdomen  remains benign on examination. Patient has a gastric pacemaker in situ.   SIRS criteria Patient had fever with tachycardia and leukocytosis on presentation..  No obvious source of infection at this time.  Chest x-ray was negative.  Influenza COVID and RSV was negative.   Likely secondary to viral gastroenteritis.  Continue supportive care.  ProCalcitonin less than 0.1.    Bacteremia Gram-positive rods noted in 1 set of blood cultures.  Likely contaminant.  WBC is normal.  She is afebrile.   Hypomagnesemia: Supplemented    Non-anion gap metabolic acidosis:  Secondary to diarrhea.  Stable for the most part.    Type 2 Diabetes Mellitus with peripheral neuropathy.   Hypoglycemia.   Recent HbA1c of 5.9 noted.  Noted to be on SSI and low-dose glargine.  Had low glucose levels overnight likely due to poor oral intake.  Dose of glargine was decreased.    GERD: Pepcid on hold due to diarrhea.    Essential Hypertension: on  carvedilol, amlodipine at home.  Continue medication, closely monitor.    Anemia of chronic disease:   Hemoglobin stable for the most part.  No evidence of overt bleeding.  Mild dilutional drop noted.  Status post bilateral AKA Followed by Dr. Lajoyce Corners.  Has a follow-up in the next few weeks to consider ?prosthesis.    DVT prophylaxis: SCDs Start: 03/02/23 0253 Disposition: Home when vomiting has resolved  Code Status:     Code Status: Full Code  Family Communication: No family at bedside  Consultants: None  Procedures: None    Subjective: Continues to have episodes of vomiting.  Objective: Vitals:   03/05/23 2103 03/06/23 0448  BP: (!) 127/94 (!) 147/98  Pulse: 95 87  Resp: 18 18  Temp: 98.5 F (36.9 C) 98 F (36.7 C)  SpO2: 100% 100%    Intake/Output Summary (Last 24 hours) at 03/06/2023 1051 Last data filed at 03/05/2023 2100 Gross per 24 hour  Intake 818 ml  Output --  Net 818 ml   Filed Weights   03/04/23 0500 03/05/23 0500 03/06/23 0500   Weight: 67.1 kg 63.7 kg 62.6 kg   Body mass index is 20.98 kg/m.   Physical Exam:  General appearance: Awake alert.  In no distress Resp: Clear to auscultation bilaterally.  Normal effort Cardio: S1-S2 is normal regular.  No S3-S4.  No rubs murmurs or bruit GI: Abdomen is soft.  Nontender nondistended.  Bowel sounds are present normal.  No masses organomegaly    Data Review: I have personally reviewed the following laboratory data and studies,  CBC: Recent Labs  Lab 03/01/23 2325 03/02/23 0444 03/05/23 0629  WBC 12.1* 10.5 6.1  NEUTROABS  --  9.4*  --   HGB 10.3* 9.4* 9.5*  HCT 33.0* 30.8* 29.5*  MCV 92.2 91.9 87.8  PLT 339 304 362   Basic Metabolic Panel: Recent Labs  Lab 03/02/23 0134 03/02/23 0444 03/03/23 1136 03/04/23 0722 03/05/23 0359  NA 135 137 135 136 132*  K 3.7 3.5 3.8 4.0 4.0  CL 112* 117* 111 111 104  CO2 15* 15* 17* 17* 20*  GLUCOSE 195* 166* 97 83 118*  BUN 34* 34* 31* 25* 20  CREATININE 2.63* 2.38* 1.95* 1.68* 1.48*  CALCIUM 7.9* 7.5* 8.1* 8.6* 8.3*  MG 1.5* 2.1  --   --   --    Liver Function Tests: Recent Labs  Lab 03/02/23 0134 03/02/23 0444  AST 21 20  ALT 20 19  ALKPHOS 83 75  BILITOT 0.3 0.4  PROT 4.7* 4.4*  ALBUMIN <1.5* <1.5*   CBG: Recent Labs  Lab 03/05/23 0757 03/05/23 1204 03/05/23 1720 03/05/23 2035 03/06/23 0742  GLUCAP 109* 109* 75 114* 92   Recent Results (from the past 240 hours)  Resp panel by RT-PCR (RSV, Flu A&B, Covid) Anterior Nasal Swab     Status: None   Collection Time: 03/02/23 12:15 AM   Specimen: Anterior Nasal Swab  Result Value Ref Range Status   SARS Coronavirus 2 by RT PCR NEGATIVE NEGATIVE Final    Comment: (NOTE) SARS-CoV-2 target nucleic acids are NOT DETECTED.  The SARS-CoV-2 RNA is generally detectable in upper respiratory specimens during the acute phase of infection. The lowest concentration of SARS-CoV-2 viral copies this assay can detect is 138 copies/mL. A negative result does  not preclude SARS-Cov-2 infection and should not be used as the sole basis for treatment or other patient management decisions. A negative result may occur with  improper specimen collection/handling, submission of specimen other than nasopharyngeal swab, presence of viral mutation(s) within the areas targeted by this assay, and inadequate number of viral copies(<138 copies/mL). A negative result must be combined with clinical observations, patient history, and epidemiological information. The expected result is Negative.  Fact Sheet for Patients:  BloggerCourse.com  Fact Sheet for Healthcare Providers:  SeriousBroker.it  This test is no t yet approved or cleared by the [place] FDA and  has been authorized for detection and/or diagnosis of SARS-CoV-2 by FDA under an Emergency Use Authorization (EUA). This EUA will remain  in effect (meaning this test can be used) for the duration of the COVID-19 declaration under Section 564(b)(1) of the Act, 21 U.S.C.section 360bbb-3(b)(1), unless the authorization  is terminated  or revoked sooner.       Influenza A by PCR NEGATIVE NEGATIVE Final   Influenza B by PCR NEGATIVE NEGATIVE Final    Comment: (NOTE) The Xpert Xpress SARS-CoV-2/FLU/RSV plus assay is intended as an aid in the diagnosis of influenza from Nasopharyngeal swab specimens and should not be used as a sole basis for treatment. Nasal washings and aspirates are unacceptable for Xpert Xpress SARS-CoV-2/FLU/RSV testing.  Fact Sheet for Patients: BloggerCourse.com  Fact Sheet for Healthcare Providers: SeriousBroker.it  This test is not yet approved or cleared by the [place] FDA and has been authorized for detection and/or diagnosis of SARS-CoV-2 by FDA under an Emergency Use Authorization (EUA). This EUA will remain in effect (meaning this test can be used) for the  duration of the COVID-19 declaration under Section 564(b)(1) of the Act, 21 U.S.C. section 360bbb-3(b)(1), unless the authorization is terminated or revoked.     Resp Syncytial Virus by PCR NEGATIVE NEGATIVE Final    Comment: (NOTE) Fact Sheet for Patients: BloggerCourse.com  Fact Sheet for Healthcare Providers: SeriousBroker.it  This test is not yet approved or cleared by the [place] FDA and has been authorized for detection and/or diagnosis of SARS-CoV-2 by FDA under an Emergency Use Authorization (EUA). This EUA will remain in effect (meaning this test can be used) for the duration of the COVID-19 declaration under Section 564(b)(1) of the Act, 21 U.S.C. section 360bbb-3(b)(1), unless the authorization is terminated or revoked.  Performed at [facility], [address], [place], [place] [address]   Culture, blood (Routine X 2) w Reflex to ID Panel     Status: None (Preliminary result)   Collection Time: 03/02/23  4:44 AM   Specimen: BLOOD  Result Value Ref Range Status   Specimen Description   Final    BLOOD RIGHT ANTECUBITAL Performed at [facility], [address] [place], [place] [address]    Special Requests   Final    BOTTLES DRAWN AEROBIC AND ANAEROBIC Blood Culture results may not be optimal due to an inadequate volume of blood received in culture bottles Performed at [facility], [address], [place], [place] [address]    Culture   Final    NO GROWTH 4 DAYS Performed at [facility], [address] [place], [place] [address]    Report Status PENDING  Incomplete  Culture, blood (Routine X 2) w Reflex to ID Panel     Status: Abnormal   Collection Time: 03/02/23  6:28 PM   Specimen: BLOOD RIGHT HAND  Result Value Ref Range Status   Specimen Description   Final    BLOOD RIGHT HAND Performed at [facility], [address],  [place], [place] [address]    Special Requests   Final    BOTTLES DRAWN AEROBIC ONLY Blood Culture results may not be optimal due to an inadequate volume of blood received in culture bottles Performed at [facility], [address] [place], [place] [address]    Culture  Setup Time   Final    GRAM POSITIVE RODS AEROBIC BOTTLE ONLY CRITICAL RESULT CALLED TO, READ BACK BY AND VERIFIED WITH: PHARMD MICHELLE LILLISTON ON 03/04/23 @ 2309 BY DRT    Culture (A)  Final    ACTINOMYCES SPECIES Standardized susceptibility testing for this organism is not available. Performed at [facility], [address], [place], [place] [address]    Report  Status 03/06/2023 FINAL  Final     Studies: No results found.     Osvaldo Shipper, MD  [facility] 03/06/2023  If 7PM-7AM, please contact night-coverage

## 2023-03-07 ENCOUNTER — Other Ambulatory Visit (HOSPITAL_COMMUNITY): Payer: Self-pay

## 2023-03-07 DIAGNOSIS — E1142 Type 2 diabetes mellitus with diabetic polyneuropathy: Secondary | ICD-10-CM | POA: Diagnosis not present

## 2023-03-07 DIAGNOSIS — Z794 Long term (current) use of insulin: Secondary | ICD-10-CM | POA: Diagnosis not present

## 2023-03-07 LAB — COMPREHENSIVE METABOLIC PANEL
ALT: 22 U/L (ref 0–44)
AST: 21 U/L (ref 15–41)
Albumin: <1.5 g/dL — ABNORMAL LOW (ref 3.5–5.0)
Alkaline Phosphatase: 102 U/L (ref 38–126)
Anion gap: 2 — ABNORMAL LOW (ref 5–15)
BUN: 20 mg/dL (ref 6–20)
CO2: 27 mmol/L (ref 22–32)
Calcium: 8 mg/dL — ABNORMAL LOW (ref 8.9–10.3)
Chloride: 105 mmol/L (ref 98–111)
Creatinine, Ser: 1.14 mg/dL — ABNORMAL HIGH (ref 0.44–1.00)
GFR, Estimated: >60 mL/min (ref >60)
Glucose, Bld: 69 mg/dL — ABNORMAL LOW (ref 70–99)
Potassium: 4.3 mmol/L (ref 3.5–5.1)
Sodium: 134 mmol/L — ABNORMAL LOW (ref 135–145)
Total Bilirubin: 0.4 mg/dL (ref 0.0–1.2)
Total Protein: 4.6 g/dL — ABNORMAL LOW (ref 6.5–8.1)

## 2023-03-07 LAB — CBC
HCT: 32.6 % — ABNORMAL LOW (ref 36.0–46.0)
Hemoglobin: 10 g/dL — ABNORMAL LOW (ref 12.0–15.0)
MCH: 27.5 pg (ref 26.0–34.0)
MCHC: 30.7 g/dL (ref 30.0–36.0)
MCV: 89.6 fL (ref 80.0–100.0)
Platelets: 406 10*3/uL — ABNORMAL HIGH (ref 150–400)
RBC: 3.64 MIL/uL — ABNORMAL LOW (ref 3.87–5.11)
RDW: 15.3 % (ref 11.5–15.5)
WBC: 7.2 10*3/uL (ref 4.0–10.5)
nRBC: 0 % (ref 0.0–0.2)

## 2023-03-07 LAB — GLUCOSE, CAPILLARY
Glucose-Capillary: 84 mg/dL (ref 70–99)
Glucose-Capillary: 149 mg/dL — ABNORMAL HIGH (ref 70–99)

## 2023-03-07 LAB — MAGNESIUM: Magnesium: 1.7 mg/dL (ref 1.7–2.4)

## 2023-03-07 LAB — CULTURE, BLOOD (ROUTINE X 2): Culture: NO GROWTH

## 2023-03-07 MED ORDER — HYDROXYZINE HCL 10 MG PO TABS
10.0000 mg | ORAL_TABLET | Freq: Three times a day (TID) | ORAL | 0 refills | Status: AC | PRN
  Filled 2023-03-07: qty 30, 10d supply, fill #0

## 2023-03-07 MED ORDER — METOCLOPRAMIDE HCL 10 MG PO TABS
10.0000 mg | ORAL_TABLET | Freq: Three times a day (TID) | ORAL | 1 refills | Status: DC
  Filled 2023-03-07: qty 120, 30d supply, fill #0

## 2023-03-07 MED ORDER — OXYCODONE HCL 5 MG PO TABS
5.0000 mg | ORAL_TABLET | Freq: Four times a day (QID) | ORAL | 0 refills | Status: DC | PRN
  Filled 2023-03-07: qty 15, 4d supply, fill #0

## 2023-03-07 NOTE — Discharge Summary (Signed)
[facility]  Physician Discharge Summary   Patient ID: Kelli Hudson MRN: [id number] DOB/AGE: 32-Jun-1992 32 y.o.  Admit date: 03/01/2023 Discharge date:   03/07/2023   PCP: Cristino Martes, NP  DISCHARGE DIAGNOSES:    Acute renal failure superimposed on stage 3b chronic kidney disease (HCC)   Essential hypertension   GERD (gastroesophageal reflux disease)   DM2 (diabetes mellitus, type 2) (HCC) Diabetic gastroparesis Acute gastroenteritis   Anemia of chronic disease   RECOMMENDATIONS FOR OUTPATIENT FOLLOW UP: Follow-up with primary care provider in 1 week    Home Health: None Equipment/Devices: None  CODE STATUS: Full code  DISCHARGE CONDITION: fair  Diet recommendation: Modified carbohydrate  INITIAL HISTORY: 33 y.o. 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  In the ED, patient was tachycardic and febrile.  Labs showed hypokalemia with potassium of 3.5 with creatinine of 2.6 compared to baseline 1.6.  Magnesium level slightly low at 1.5.  Lactate was 1.2 and mild leukocytosis at 12, 100. COVID, influenza, RSV PCR were all negative.  EKG showed normal sinus rhythm.  Patient was then considered for admission to the hospital for acute kidney injury secondary to volume depletion with SIRS criteria.   HOSPITAL COURSE:   Acute Kidney Injury superimposed on CKD 3B: Baseline creatinine around 1.3-1.6, initial creatinine of 2.6.   Likely secondary to nausea vomiting diarrhea.   Patient was given IV hydration with improvement in renal function.   Nausea, vomiting, diarrhea/history of gastroparesis Mostly due to viral  gastroenteritis.  However gastroparesis cannot be ruled out.   Diarrhea has resolved.  Nausea and vomiting he took a long time to get better.  Was prescribed intravenous metoclopramide here in the hospital.   Patient has a gastric pacemaker in situ. Abdominal exam was benign.  Abdominal x-rays were unremarkable. Feels better this morning.  Tolerated her diet yesterday.   SIRS criteria Patient had fever with tachycardia and leukocytosis on presentation..  No obvious source of infection at this time.  Chest x-ray was negative.  Influenza COVID and RSV was negative.   Likely secondary to viral gastroenteritis.  Continue supportive care.  ProCalcitonin less than 0.1.     Bacteremia Gram-positive rods noted in 1 set of blood cultures.  Likely contaminant.  WBC is normal.  She is afebrile.   Hypomagnesemia: Supplemented    Non-anion gap metabolic acidosis:  Secondary to diarrhea.  Stable for the most part.     Type 2 Diabetes Mellitus with peripheral neuropathy.   Hypoglycemia.   HbA1c 7.2.  Was 5.9 recently.  Had few episodes of low glucose levels here in the hospital.  Patient told to stop taking the long-acting insulin until after glucose levels improve.  She was asked to check her glucose levels at home and resume her insulin once levels reached 200.    Essential Hypertension: Resume home medications    Anemia of chronic disease:   Hemoglobin stable for the most part.     Status post bilateral AKA Followed by Dr. Lajoyce Corners.  Has a follow-up in the next few weeks to consider ?prosthesis.     Patient is stable.  Okay for discharge home today.   PERTINENT LABS:  The results of significant diagnostics from this hospitalization (including imaging, microbiology,  ancillary and laboratory) are listed below for reference.    Microbiology: Recent Results (from the past 240 hours)  Resp panel by RT-PCR (RSV, Flu A&B, Covid) Anterior Nasal Swab     Status: None   Collection Time: 03/02/23 12:15  AM   Specimen: Anterior Nasal Swab  Result Value Ref Range Status   SARS Coronavirus 2 by RT PCR NEGATIVE NEGATIVE Final    Comment: (NOTE) SARS-CoV-2 target nucleic acids are NOT DETECTED.  The SARS-CoV-2 RNA is generally detectable in upper respiratory specimens during the acute phase of infection. The lowest concentration of SARS-CoV-2 viral copies this assay can detect is 138 copies/mL. A negative result does not preclude SARS-Cov-2 infection and should not be used as the sole basis for treatment or other patient management decisions. A negative result may occur with  improper specimen collection/handling, submission of specimen other than nasopharyngeal swab, presence of viral mutation(s) within the areas targeted by this assay, and inadequate number of viral copies(<138 copies/mL). A negative result must be combined with clinical observations, patient history, and epidemiological information. The expected result is Negative.  Fact Sheet for Patients:  BloggerCourse.com  Fact Sheet for Healthcare Providers:  SeriousBroker.it  This test is no t yet approved or cleared by the [place] FDA and  has been authorized for detection and/or diagnosis of SARS-CoV-2 by FDA under an Emergency Use Authorization (EUA). This EUA will remain  in effect (meaning this test can be used) for the duration of the COVID-19 declaration under Section 564(b)(1) of the Act, 21 U.S.C.section 360bbb-3(b)(1), unless the authorization is terminated  or revoked sooner.       Influenza A by PCR NEGATIVE NEGATIVE Final   Influenza B by PCR NEGATIVE NEGATIVE Final    Comment: (NOTE) The Xpert Xpress SARS-CoV-2/FLU/RSV plus assay is intended as an aid in the diagnosis of influenza from Nasopharyngeal swab specimens and should not be used as a sole basis for treatment. Nasal washings and aspirates are unacceptable for Xpert Xpress  SARS-CoV-2/FLU/RSV testing.  Fact Sheet for Patients: BloggerCourse.com  Fact Sheet for Healthcare Providers: SeriousBroker.it  This test is not yet approved or cleared by the [place] FDA and has been authorized for detection and/or diagnosis of SARS-CoV-2 by FDA under an Emergency Use Authorization (EUA). This EUA will remain in effect (meaning this test can be used) for the duration of the COVID-19 declaration under Section 564(b)(1) of the Act, 21 U.S.C. section 360bbb-3(b)(1), unless the authorization is terminated or revoked.     Resp Syncytial Virus by PCR NEGATIVE NEGATIVE Final    Comment: (NOTE) Fact Sheet for Patients: BloggerCourse.com  Fact Sheet for Healthcare Providers: SeriousBroker.it  This test is not yet approved or cleared by the [place] FDA and has been authorized for detection and/or diagnosis of SARS-CoV-2 by FDA under an Emergency Use Authorization (EUA). This EUA will remain in effect (meaning this test can be used) for the duration of the COVID-19 declaration under Section 564(b)(1) of the Act, 21 U.S.C. section 360bbb-3(b)(1), unless the authorization is terminated or revoked.  Performed at [facility], [address], [place], [place] [address]   Culture, blood (Routine X 2) w Reflex to ID Panel     Status: None   Collection Time: 03/02/23  4:44 AM   Specimen: BLOOD  Result Value Ref Range Status   Specimen Description   Final    BLOOD RIGHT ANTECUBITAL Performed at [facility], [address], [place], [place] [address]  Special Requests   Final    BOTTLES DRAWN AEROBIC AND ANAEROBIC Blood Culture results may not be optimal due to an inadequate volume of blood received in culture bottles Performed at [facility], [address] [place], [place] [address]    Culture    Final    NO GROWTH 5 DAYS Performed at [facility], [address] [place], [place] [address]    Report Status 03/07/2023 FINAL  Final  Culture, blood (Routine X 2) w Reflex to ID Panel     Status: Abnormal   Collection Time: 03/02/23  6:28 PM   Specimen: BLOOD RIGHT HAND  Result Value Ref Range Status   Specimen Description   Final    BLOOD RIGHT HAND Performed at [facility], [address], [place], [place] [address]    Special Requests   Final    BOTTLES DRAWN AEROBIC ONLY Blood Culture results may not be optimal due to an inadequate volume of blood received in culture bottles Performed at [facility], [address], [place], [place] [address]    Culture  Setup Time   Final    GRAM POSITIVE RODS AEROBIC BOTTLE ONLY CRITICAL RESULT CALLED TO, READ BACK BY AND VERIFIED WITH: PHARMD MICHELLE LILLISTON ON 03/04/23 @ 2309 BY DRT    Culture (A)  Final    ACTINOMYCES SPECIES Standardized susceptibility testing for this organism is not available. Performed at [facility], [address] [place], [place] [address]    Report Status 03/06/2023 FINAL  Final     Labs:   Basic Metabolic Panel: Recent Labs  Lab 03/02/23 0134 03/02/23 0444 03/03/23 1136 03/04/23 0722 03/05/23 0359 03/07/23 0443  NA 135 137 135 136 132* 134*  K 3.7 3.5 3.8 4.0 4.0 4.3  CL 112* 117* 111 111 104 105  CO2 15* 15* 17* 17* 20* 27  GLUCOSE 195* 166* 97 83 118* 69*  BUN 34* 34* 31* 25* 20 20  CREATININE 2.63* 2.38* 1.95* 1.68* 1.48* 1.14*  CALCIUM 7.9* 7.5* 8.1* 8.6* 8.3* 8.0*  MG 1.5* 2.1  --   --   --  1.7   Liver Function Tests: Recent Labs  Lab 03/02/23 0134 03/02/23 0444 03/07/23 0443  AST 21 20 21   ALT 20 19 22   ALKPHOS 83 75 102  BILITOT 0.3 0.4 0.4  PROT 4.7* 4.4* 4.6*  ALBUMIN <1.5* <1.5* <1.5*    CBC: Recent Labs  Lab 03/01/23 2325 03/02/23 0444 03/05/23 0629 03/07/23 0443  WBC 12.1* 10.5 6.1 7.2  NEUTROABS  --  9.4*  --   --    HGB 10.3* 9.4* 9.5* 10.0*  HCT 33.0* 30.8* 29.5* 32.6*  MCV 92.2 91.9 87.8 89.6  PLT 339 304 362 406*     CBG: Recent Labs  Lab 03/06/23 1421 03/06/23 1655 03/06/23 2030 03/07/23 0742 03/07/23 1118  GLUCAP 59* 168* 143* 84 149*     IMAGING STUDIES DG Abd Portable 1V Result Date: 03/06/2023 CLINICAL DATA:  Nausea and vomiting. EXAM: PORTABLE ABDOMEN - 1 VIEW COMPARISON:  02/21/2023 FINDINGS: Normal bowel gas pattern. Stable left mid abdominal gastric stimulator generator and leads. Unremarkable bones. Stable left pelvic phlebolith. IMPRESSION: No acute abnormality. Electronically Signed   By: Beckie Salts M.D.   On: 03/06/2023 15:09   DG Chest Port 1 View Result Date: 03/02/2023 CLINICAL DATA:  [id number].  Fever.  Near syncopal episode. EXAM: PORTABLE CHEST 1 VIEW COMPARISON:  PA chest  02/21/2023. FINDINGS: The heart size and mediastinal contours are within normal limits. Both lungs are clear. The visualized skeletal structures are unremarkable. IMPRESSION: No active disease. Electronically Signed   By: Almira Bar M.D.   On: 03/02/2023 04:22   DG ABD ACUTE 2+V W 1V CHEST Result Date: 02/21/2023 CLINICAL DATA:  Intractable nausea and vomiting. EXAM: Abdominal pain. COMPARISON:  Radiographs 01/21/2023.  Abdominopelvic CT 11/28/2022. FINDINGS: The heart size and mediastinal contours are stable. The lungs are clear. No pleural effusion or pneumothorax. There is a normal nonobstructive bowel gas pattern. Mildly prominent stool in the distal colon. No evidence of pneumoperitoneum or suspicious abdominal calcification. Left pelvic calcification corresponds with a phlebolith on prior CT. Left anterior abdominal wall generator and abandoned leads are unchanged. IMPRESSION: 1. No evidence of acute cardiopulmonary process. 2. No evidence of bowel obstruction or pneumoperitoneum. Mildly prominent stool in the distal colon. Electronically Signed   By: Carey Bullocks M.D.   On: 02/21/2023 16:14     DISCHARGE EXAMINATION: Vitals:   03/06/23 1310 03/06/23 2027 03/07/23 0424 03/07/23 1120  BP: (!) 145/97 130/87 (!) 141/96 (!) 141/98  Pulse: 91 (!) 107 98 100  Resp: 16 16 20 16   Temp: 98.5 F (36.9 C) 98.4 F (36.9 C) 98 F (36.7 C) 98 F (36.7 C)  TempSrc: Oral Oral Oral Oral  SpO2: 100% 100% 100% 100%  Weight:      Height:       General appearance: Awake alert.  In no distress Resp: Clear to auscultation bilaterally.  Normal effort Cardio: S1-S2 is normal regular.  No S3-S4.  No rubs murmurs or bruit GI: Abdomen is soft.  Nontender nondistended.  Bowel sounds are present normal.  No masses organomegaly   DISPOSITION: Home  Discharge Instructions     Call MD for:  difficulty breathing, headache or visual disturbances   Complete by: As directed    Call MD for:  extreme fatigue   Complete by: As directed    Call MD for:  persistant dizziness or light-headedness   Complete by: As directed    Call MD for:  persistant nausea and vomiting   Complete by: As directed    Call MD for:  severe uncontrolled pain   Complete by: As directed    Call MD for:  temperature >100.4   Complete by: As directed    Diet Carb Modified   Complete by: As directed    Discharge instructions   Complete by: As directed    Please take your medications as prescribed.  Please do not take the long-acting insulin as discussed until your glucose levels stabilize and are consistently greater than 200.  Please check your glucose levels on a daily basis.  You were cared for by a hospitalist during your hospital stay. If you have any questions about your discharge medications or the care you received while you were in the hospital after you are discharged, you can call the unit and asked to speak with the hospitalist on call if the hospitalist that took care of you is not available. Once you are discharged, your primary care physician will handle any further medical issues. Please note that NO REFILLS  for any discharge medications will be authorized once you are discharged, as it is imperative that you return to your primary care physician (or establish a relationship with a primary care physician if you do not have one) for your aftercare needs so that they can reassess your need for medications  and monitor your lab values. If you do not have a primary care physician, you can call [phone] for a physician referral.   Increase activity slowly   Complete by: As directed          Allergies as of 03/07/2023       Reactions   Ibuprofen Other (See Comments)   07/23/22 - Worsening kidney function after 3 doses of ibuprofen 400mg .  Creatinine increased from 1.3 to 2.  Creatinine returned to normal after stopping.   Lisinopril Cough, Other (See Comments), Itching, Rash   Breakout in rash        Medication List     STOP taking these medications    Levemir FlexTouch 100 UNIT/ML FlexTouch Pen Generic drug: insulin detemir   potassium chloride SA 20 MEQ tablet Commonly known as: KLOR-CON M       TAKE these medications    acetaminophen 325 MG tablet Commonly known as: TYLENOL Take 2 tablets (650 mg total) by mouth every 6 (six) hours as needed for mild pain, fever or headache.   amLODipine 10 MG tablet Commonly known as: NORVASC Take 1 tablet (10 mg total) by mouth daily.   ascorbic acid 1000 MG tablet Commonly known as: VITAMIN C Take 1 tablet (1,000 mg total) by mouth daily. What changed: when to take this   carvedilol 6.25 MG tablet Commonly known as: COREG Take 1 tablet (6.25 mg total) by mouth 2 (two) times daily with a meal.   Dexcom G6 Transmitter Misc 1 Device by Does not apply route as directed.   famotidine 20 MG tablet Commonly known as: PEPCID Take 1 tablet (20 mg total) by mouth 2 (two) times daily.   feeding supplement (GLUCERNA SHAKE) Liqd Take 237 mLs by mouth 3 (three) times daily between meals. What changed: Another medication with the same name was  removed. Continue taking this medication, and follow the directions you see here.   gabapentin 300 MG capsule Commonly known as: NEURONTIN Take 1 capsule (300 mg total) by mouth every 8 (eight) hours.   hydrOXYzine 10 MG tablet Commonly known as: ATARAX Take 1 tablet (10 mg total) by mouth 3 (three) times daily as needed for anxiety.   insulin aspart 100 UNIT/ML injection Commonly known as: novoLOG Inject 0-9 Units into the skin 3 (three) times daily with meals. What changed: how much to take   Insulin Pen Needle 32G X 4 MM Misc 1 Device by Does not apply route in the morning, at noon, in the evening, and at bedtime.   MAGNESIUM PO Take 1 tablet by mouth daily.   methocarbamol 500 MG tablet Commonly known as: ROBAXIN Take 500 mg by mouth 3 (three) times daily as needed for muscle spasms.   metoCLOPramide 10 MG tablet Commonly known as: REGLAN Take 1 tablet (10 mg total) by mouth 4 (four) times daily -  before meals and at bedtime. What changed:  when to take this reasons to take this   multivitamin with minerals Tabs tablet Take 1 tablet by mouth daily.   Nexplanon 68 MG Impl implant Generic drug: etonogestrel 1 each by Subdermal route continuous.   oxyCODONE 5 MG immediate release tablet Commonly known as: Oxy IR/ROXICODONE Take 1 tablet (5 mg total) by mouth every 6 (six) hours as needed for severe pain (pain score 7-10).   sucralfate 1 g tablet Commonly known as: CARAFATE Take 1 g by mouth in the morning, at noon, and at bedtime.   traZODone 150 MG  tablet Commonly known as: DESYREL Take 150 mg by mouth at bedtime.          Follow-up Information     Cristino Martes, NP. Schedule an appointment as soon as possible for a visit.   Specialty: Nurse Practitioner Why: post hospitalization follow up Contact information: [address] [place] [address] [phone]                 TOTAL DISCHARGE TIME: 35 minutes  Dawaun Brancato  Rito Ehrlich  [facility] Pager on [email or web address]  03/07/2023, 11:26 AM

## 2023-03-07 NOTE — TOC Progression Note (Addendum)
Transition of Care [facility]) - Progression Note    Patient Details  Name: Seth Borruso MRN: [id number] Date of Birth: [birth year]  [facility] CM/SW Contact  Maryjean Ka, [place] Phone Number: 03/07/2023, 1:32 PM  Clinical Narrative:    Patient will be going home with family/friend support system. Patient reports that she will return back to the community to her apartment. Patient reports that she lives alone. Patient denies SDOH needs. Patient reports that she has assess to obtain medications financially and transport for appointments.  Patient reports that she has needed DME at home. This CSW coordinated PTAR transport for patient to discharge home today and informed nursing.  TOC signing off please reconsult with any other TOC needs.  Expected Discharge Plan: Home/Self Care Barriers to Discharge: No Barriers Identified  Expected Discharge Plan and Services In-house Referral: Clinical Social Work   Post Acute Care Choice: NA Living arrangements for the past 2 months: Apartment Expected Discharge Date: 03/07/23               DME Arranged: N/A DME Agency: NA                   Social Determinants of Health (SDOH) Interventions SDOH Screenings   Food Insecurity: No Food Insecurity (03/02/2023)  Recent Concern: Food Insecurity - Food Insecurity Present (12/29/2022)   Received from [facility]  Housing: High Risk (03/02/2023)  Transportation Needs: No Transportation Needs (03/02/2023)  Utilities: Not At Risk (03/02/2023)  Alcohol Screen: Low Risk  (05/29/2021)  Depression (PHQ2-9): Low Risk  (05/29/2021)  Recent Concern: Depression (PHQ2-9) - Medium Risk (04/10/2021)  Financial Resource Strain: Low Risk  (02/13/2022)   Received from [facility], [facility]  Social Connections: Moderately Integrated (02/21/2023)  Stress: No Stress Concern Present (12/29/2022)   Received from [facility]  Tobacco Use: Low Risk  (03/02/2023)    Readmission Risk  Interventions    03/07/2023    1:30 PM 03/03/2023   11:39 AM 11/27/2022    2:33 PM  Readmission Risk Prevention Plan  Transportation Screening Complete Complete Complete  PCP or Specialist Appt within 3-5 Days   Not Complete  HRI or Home Care Consult   Complete  Social Work Consult for Recovery Care Planning/Counseling   Complete  Palliative Care Screening   Not Applicable  Medication Review Oceanographer) Complete Complete Complete  PCP or Specialist appointment within 3-5 days of discharge Complete    HRI or Home Care Consult Not Complete Complete   SW Recovery Care/Counseling Consult Complete Complete   Palliative Care Screening Not Complete Not Applicable   Skilled Nursing Facility Not Complete Not Applicable

## 2023-03-07 NOTE — Progress Notes (Signed)
TOC discharge meds in secure bag delivered to the patient by this RN - primary nurse updated

## 2023-03-09 ENCOUNTER — Other Ambulatory Visit (HOSPITAL_COMMUNITY): Payer: Self-pay

## 2023-03-23 ENCOUNTER — Encounter (HOSPITAL_COMMUNITY): Payer: Self-pay

## 2023-03-23 ENCOUNTER — Other Ambulatory Visit: Payer: Self-pay

## 2023-03-23 ENCOUNTER — Emergency Department (HOSPITAL_COMMUNITY)
Admission: EM | Admit: 2023-03-23 | Discharge: 2023-03-24 | Disposition: A | Discharge status: Home / Self Care | Payer: 59 | Source: Home / Self Care | Attending: Emergency Medicine | Admitting: Emergency Medicine

## 2023-03-23 DIAGNOSIS — Z794 Long term (current) use of insulin: Secondary | ICD-10-CM | POA: Insufficient documentation

## 2023-03-23 DIAGNOSIS — I10 Essential (primary) hypertension: Secondary | ICD-10-CM | POA: Insufficient documentation

## 2023-03-23 DIAGNOSIS — E1143 Type 2 diabetes mellitus with diabetic autonomic (poly)neuropathy: Secondary | ICD-10-CM | POA: Insufficient documentation

## 2023-03-23 DIAGNOSIS — Z79899 Other long term (current) drug therapy: Secondary | ICD-10-CM | POA: Insufficient documentation

## 2023-03-23 DIAGNOSIS — K3184 Gastroparesis: Secondary | ICD-10-CM | POA: Insufficient documentation

## 2023-03-23 DIAGNOSIS — R112 Nausea with vomiting, unspecified: Secondary | ICD-10-CM | POA: Diagnosis not present

## 2023-03-23 DIAGNOSIS — E1043 Type 1 diabetes mellitus with diabetic autonomic (poly)neuropathy: Secondary | ICD-10-CM | POA: Diagnosis not present

## 2023-03-23 LAB — COMPREHENSIVE METABOLIC PANEL
ALT: 44 U/L (ref 0–44)
AST: 40 U/L (ref 15–41)
Albumin: 1.6 g/dL — ABNORMAL LOW (ref 3.5–5.0)
Alkaline Phosphatase: 128 U/L — ABNORMAL HIGH (ref 38–126)
Anion gap: 9 (ref 5–15)
BUN: 18 mg/dL (ref 6–20)
CO2: 23 mmol/L (ref 22–32)
Calcium: 8.6 mg/dL — ABNORMAL LOW (ref 8.9–10.3)
Chloride: 108 mmol/L (ref 98–111)
Creatinine, Ser: 0.99 mg/dL (ref 0.44–1.00)
GFR, Estimated: >60 mL/min (ref >60)
Glucose, Bld: 151 mg/dL — ABNORMAL HIGH (ref 70–99)
Potassium: 3.7 mmol/L (ref 3.5–5.1)
Sodium: 140 mmol/L (ref 135–145)
Total Bilirubin: 0.5 mg/dL (ref 0.0–1.2)
Total Protein: 5.5 g/dL — ABNORMAL LOW (ref 6.5–8.1)

## 2023-03-23 LAB — URINALYSIS, ROUTINE W REFLEX MICROSCOPIC
Bilirubin Urine: NEGATIVE
Glucose, UA: 150 mg/dL — AB
Hgb urine dipstick: NEGATIVE
Ketones, ur: NEGATIVE mg/dL
Leukocytes,Ua: NEGATIVE
Nitrite: NEGATIVE
Protein, ur: >=300 mg/dL — AB
Specific Gravity, Urine: 1.017 (ref 1.005–1.030)
pH: 5 (ref 5.0–8.0)

## 2023-03-23 LAB — CBC
HCT: 34.6 % — ABNORMAL LOW (ref 36.0–46.0)
Hemoglobin: 10.9 g/dL — ABNORMAL LOW (ref 12.0–15.0)
MCH: 28.2 pg (ref 26.0–34.0)
MCHC: 31.5 g/dL (ref 30.0–36.0)
MCV: 89.6 fL (ref 80.0–100.0)
Platelets: 480 10*3/uL — ABNORMAL HIGH (ref 150–400)
RBC: 3.86 MIL/uL — ABNORMAL LOW (ref 3.87–5.11)
RDW: 14.9 % (ref 11.5–15.5)
WBC: 7.7 10*3/uL (ref 4.0–10.5)
nRBC: 0 % (ref 0.0–0.2)

## 2023-03-23 LAB — LIPASE, BLOOD: Lipase: 21 U/L (ref 11–51)

## 2023-03-23 LAB — HCG, SERUM, QUALITATIVE: Preg, Serum: NEGATIVE

## 2023-03-23 MED ORDER — METOCLOPRAMIDE HCL 10 MG PO TABS: 10.0000 mg | ORAL_TABLET | Freq: Four times a day (QID) | ORAL | 0 refills | Status: AC

## 2023-03-23 MED ORDER — DROPERIDOL 2.5 MG/ML IJ SOLN
2.5000 mg | Freq: Once | INTRAMUSCULAR | Status: AC
  Administered 2023-03-23: 2.5 mg via INTRAVENOUS
  Filled 2023-03-23: qty 2

## 2023-03-23 MED ORDER — DIPHENHYDRAMINE HCL 50 MG/ML IJ SOLN
25.0000 mg | Freq: Once | INTRAMUSCULAR | Status: AC
  Administered 2023-03-23: 25 mg via INTRAVENOUS
  Filled 2023-03-23: qty 1

## 2023-03-23 MED ORDER — SODIUM CHLORIDE 0.9 % IV BOLUS
1000.0000 mL | Freq: Once | INTRAVENOUS | Status: AC
  Administered 2023-03-23: 1000 mL via INTRAVENOUS

## 2023-03-23 MED ORDER — SODIUM CHLORIDE 0.9 % IV SOLN
12.5000 mg | Freq: Once | INTRAVENOUS | Status: AC
  Administered 2023-03-23: 12.5 mg via INTRAVENOUS
  Filled 2023-03-23: qty 12.5

## 2023-03-23 MED ORDER — ONDANSETRON HCL 4 MG PO TABS
4.0000 mg | ORAL_TABLET | Freq: Once | ORAL | Status: AC
  Administered 2023-03-24: 4 mg via ORAL
  Filled 2023-03-23: qty 1

## 2023-03-23 MED ORDER — LORAZEPAM 2 MG/ML IJ SOLN
1.0000 mg | Freq: Once | INTRAMUSCULAR | Status: AC
  Administered 2023-03-23: 1 mg via INTRAVENOUS
  Filled 2023-03-23: qty 1

## 2023-03-23 NOTE — ED Notes (Signed)
Went to d/c pt and she is vomiting after completion of Phenergan IV bag. Notified Dr. Andria Meuse

## 2023-03-23 NOTE — ED Provider Notes (Signed)
[place] [facility]MENT AT [facility] Provider Note  CSN: [id number] Arrival date & time: 03/23/23 1210  Chief Complaint(s) Abdominal Pain and Emesis  HPI Kelli Hudson is a 33 y.o. female today for epigastric pain and vomiting.  Patient has a history of gastroparesis, states that her symptoms began at about 1030 this morning.  She says that this feels similar to her previous gastroparesis episodes.   Past Medical History Past Medical History:  Diagnosis Date   Acute H. pylori gastric ulcer    Coffee ground emesis    Diabetes mellitus (HCC)    Diabetic gastroparesis (HCC)    DKA (diabetic ketoacidosis) (HCC) 02/24/2021   Gastroparesis    GERD (gastroesophageal reflux disease)    Hypertension    Hyperthyroidism    Intractable nausea and vomiting 04/20/2021   Normocytic anemia 04/20/2020   Prolonged Q-T interval on ECG    Patient Active Problem List   Diagnosis Date Noted   SIRS (systemic inflammatory response syndrome) (HCC) 03/02/2023   Fever 03/02/2023   Metabolic acidosis 03/02/2023   Hypertensive urgency 02/21/2023   Chronic pain 02/21/2023   Abscess of thigh 11/28/2022   C. 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   Hypokalemia 11/27/2022   Effusion, left knee 11/05/2022   MDD (major depressive disorder), recurrent episode, moderate (HCC) 10/16/2022   Necrotizing fasciitis of pelvic region and thigh (HCC) 10/08/2022   Necrotizing fasciitis of lower leg (HCC) 10/08/2022   Necrotizing fasciitis (HCC) 10/08/2022   Sepsis due to cellulitis (HCC) 10/03/2022   Cellulitis of lower extremity 10/03/2022   Multinodular goiter 06/18/2022   Malnutrition of moderate degree 04/18/2022   Intractable vomiting with nausea 04/17/2022   DKA (diabetic ketoacidosis) (HCC) 03/04/2022   Nausea vomiting and diarrhea 12/25/2021   Diabetic gastroparesis (HCC) 10/09/2021   Drug-seeking behavior  09/21/2021   Essential hypertension 08/25/2021   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) 08/25/2021   GERD without esophagitis 08/25/2021   Hematemesis    Prolonged Q-T interval on ECG    Nausea and vomiting 07/20/2021   Anemia of chronic disease 06/06/2021   Dehydration 06/04/2021   Hypomagnesemia 04/21/2021   Ulcerated, foot, left, with fat layer exposed (HCC) 04/20/2021   Uncontrolled type 1 diabetes mellitus with hyperglycemia, with long-term current use of insulin (HCC) 12/18/2020   DM type 1 (diabetes mellitus, type 1) (HCC) 12/18/2020   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) 11/09/2020   Diabetic retinopathy of both eyes associated with type 1 diabetes mellitus (HCC) 09/24/2020   Diabetic gastroparesis associated with type 1 diabetes mellitus (HCC) 08/12/2020   Acute renal failure superimposed on stage 3b chronic kidney disease (HCC) 07/25/2020   Generalized abdominal pain 07/23/2020   GERD (gastroesophageal reflux disease) 07/23/2020   Diabetic nephropathy associated with type 1 diabetes mellitus (HCC) 06/27/2020   Sepsis (HCC) 05/27/2020   HLD (hyperlipidemia) 05/23/2020   Sinus tachycardia 05/09/2020   Anemia 04/20/2020   Depression with anxiety 07/05/2019   Diabetic polyneuropathy associated with type 1 diabetes mellitus (HCC) 09/24/2017   Gastroparesis    Goiter 10/05/2009   DM2 (diabetes mellitus, type 2) (HCC) 10/05/2009   Hypertension associated with diabetes (HCC) 10/05/2009   Home Medication(s) Prior to Admission medications   Medication Sig Start Date End Date Taking? Authorizing Provider  metoCLOPramide (REGLAN) 10 MG tablet Take 1 tablet (10 mg total) by mouth every 6 (six) hours. 03/23/23  Yes Anders Simmonds  T, DO  acetaminophen (TYLENOL) 325 MG tablet Take 2 tablets (650 mg total) by mouth every 6 (six) hours as needed for mild pain, fever or headache. 11/22/22   Azucena Fallen, MD  amLODipine (NORVASC) 10 MG tablet  Take 1 tablet (10 mg total) by mouth daily. 12/04/22   Elgergawy, Leana Roe, MD  ascorbic acid (VITAMIN C) 1000 MG tablet Take 1 tablet (1,000 mg total) by mouth daily. Patient taking differently: Take 1,000 mg by mouth once a week. 11/22/22   Azucena Fallen, MD  carvedilol (COREG) 6.25 MG tablet Take 1 tablet (6.25 mg total) by mouth 2 (two) times daily with a meal. 12/03/22   Elgergawy, Leana Roe, MD  Continuous Glucose Transmitter (DEXCOM G6 TRANSMITTER) MISC 1 Device by Does not apply route as directed. 06/18/22   Shamleffer, Konrad Dolores, MD  etonogestrel (NEXPLANON) 68 MG IMPL implant 1 each by Subdermal route continuous.    [provider]  famotidine (PEPCID) 20 MG tablet Take 1 tablet (20 mg total) by mouth 2 (two) times daily. 12/03/22   Elgergawy, Leana Roe, MD  feeding supplement, GLUCERNA SHAKE, (GLUCERNA SHAKE) LIQD Take 237 mLs by mouth 3 (three) times daily between meals. 11/22/22   Azucena Fallen, MD  gabapentin (NEURONTIN) 300 MG capsule Take 1 capsule (300 mg total) by mouth every 8 (eight) hours. 11/22/22   Azucena Fallen, MD  hydrOXYzine (ATARAX) 10 MG tablet Take 1 tablet (10 mg total) by mouth 3 (three) times daily as needed for anxiety. 03/07/23   Osvaldo Shipper, MD  insulin aspart (NOVOLOG) 100 UNIT/ML injection Inject 0-9 Units into the skin 3 (three) times daily with meals. Patient taking differently: Inject 10-30 Units into the skin 3 (three) times daily with meals. 12/03/22   Elgergawy, Leana Roe, MD  Insulin Pen Needle 32G X 4 MM MISC 1 Device by Does not apply route in the morning, at noon, in the evening, and at bedtime. 06/18/22   Shamleffer, Konrad Dolores, MD  MAGNESIUM PO Take 1 tablet by mouth daily.    [provider]  methocarbamol (ROBAXIN) 500 MG tablet Take 500 mg by mouth 3 (three) times daily as needed for muscle spasms. 11/22/22   [provider]  Multiple Vitamin (MULTIVITAMIN WITH MINERALS) TABS tablet Take 1 tablet by  mouth daily. 11/22/22   Azucena Fallen, MD  oxyCODONE (OXY IR/ROXICODONE) 5 MG immediate release tablet Take 1 tablet (5 mg total) by mouth every 6 (six) hours as needed for severe pain (pain score 7-10). 03/07/23   Osvaldo Shipper, MD  sucralfate (CARAFATE) 1 g tablet Take 1 g by mouth in the morning, at noon, and at bedtime. 08/28/22   [provider]  traZODone (DESYREL) 150 MG tablet Take 150 mg by mouth at bedtime. 01/14/23   [provider]  Past Surgical History Past Surgical History:  Procedure Laterality Date   AMPUTATION Bilateral 10/24/2022   Procedure: BILATERAL BELOW KNEE AMPUTATION;  Surgeon: Nadara Mustard, MD;  Location: [facility] OR;  Service: Orthopedics;  Laterality: Bilateral;   AMPUTATION Bilateral 10/08/2022   Procedure: BILATERAL LEG DEBRIDEMENT;  Surgeon: Nadara Mustard, MD;  Location: [facility] OR;  Service: Orthopedics;  Laterality: Bilateral;   AMPUTATION Bilateral 11/14/2022   Procedure: BILATERAL ABOVE KNEE AMPUTATION;  Surgeon: Nadara Mustard, MD;  Location: [facility] OR;  Service: Orthopedics;  Laterality: Bilateral;   AMPUTATION TOE Left 03/10/2018   Procedure: AMPUTATION FIFTH TOE;  Surgeon: Vivi Barrack, DPM;  Location: [facility];  Service: Podiatry;  Laterality: Left;   APPLICATION OF WOUND VAC Bilateral 10/12/2022   Procedure: APPLICATION OF WOUND VAC BILATERAL LEGS;  Surgeon: Myrene Galas, MD;  Location: [facility] OR;  Service: Orthopedics;  Laterality: Bilateral;   APPLICATION OF WOUND VAC Bilateral 10/24/2022   Procedure: APPLICATION OF WOUND VAC;  Surgeon: Nadara Mustard, MD;  Location: [facility] OR;  Service: Orthopedics;  Laterality: Bilateral;   BIOPSY  01/28/2020   Procedure: BIOPSY;  Surgeon: Kathi Der, MD;  Location: [facility];  Service: Gastroenterology;;   BOTOX INJECTION  08/26/2021   Procedure: BOTOX  INJECTION;  Surgeon: Sherrilyn Rist, MD;  Location: [facility];  Service: Gastroenterology;;   ESOPHAGOGASTRODUODENOSCOPY N/A 01/28/2020   Procedure: ESOPHAGOGASTRODUODENOSCOPY (EGD);  Surgeon: Kathi Der, MD;  Location: [facility];  Service: Gastroenterology;  Laterality: N/A;   ESOPHAGOGASTRODUODENOSCOPY (EGD) WITH PROPOFOL Left 09/08/2015   Procedure: ESOPHAGOGASTRODUODENOSCOPY (EGD) WITH PROPOFOL;  Surgeon: Willis Modena, MD;  Location: [facility] ENDOSCOPY;  Service: Endoscopy;  Laterality: Left;   ESOPHAGOGASTRODUODENOSCOPY (EGD) WITH PROPOFOL N/A 08/26/2021   Procedure: ESOPHAGOGASTRODUODENOSCOPY (EGD) WITH PROPOFOL;  Surgeon: Sherrilyn Rist, MD;  Location: [facility];  Service: Gastroenterology;  Laterality: N/A;   GASTRIC STIMULATOR IMPLANT SURGERY  06/2022   at [facility]   I & D EXTREMITY Bilateral 10/12/2022   Procedure: IRRIGATION AND  DEBRIDEMENT  BILATERAL LEGS;  Surgeon: Myrene Galas, MD;  Location: [facility] OR;  Service: Orthopedics;  Laterality: Bilateral;   I & D EXTREMITY Right 10/13/2022   Procedure: RIGHT THIGH WOUND VAC EXCHANGE;  Surgeon: Roby Lofts, MD;  Location: [facility] OR;  Service: Orthopedics;  Laterality: Right;   I & D EXTREMITY Bilateral 10/17/2022   Procedure: BILATERAL THIGH AND LEG DEBRIDEMENT, PARTIAL BILATERAL FOOT AMPUTATION;  Surgeon: Nadara Mustard, MD;  Location: [facility] OR;  Service: Orthopedics;  Laterality: Bilateral;   I & D EXTREMITY Bilateral 11/05/2022   Procedure: BILATERAL THIGH DEBRIDEMENT;  Surgeon: Nadara Mustard, MD;  Location: [facility] OR;  Service: Orthopedics;  Laterality: Bilateral;   I & D EXTREMITY Bilateral 11/07/2022   Procedure: BILATERAL THIGH AND LEG DEBRIDEMENT;  Surgeon: Nadara Mustard, MD;  Location: [facility] OR;  Service: Orthopedics;  Laterality: Bilateral;   I & D EXTREMITY Bilateral 11/12/2022   Procedure: BILATERAL LEG DEBRIDEMENTS;  Surgeon: Nadara Mustard, MD;  Location: [facility] OR;  Service: Orthopedics;  Laterality: Bilateral;   IRRIGATION AND  DEBRIDEMENT FOOT Bilateral 10/05/2022   Procedure: IRRIGATION AND DEBRIDEMENT FOOT;  Surgeon: [name], DPM;  Location: [facility];  Service: Orthopedics/Podiatry;  Laterality: Bilateral;   WISDOM TOOTH EXTRACTION     Family History Family History  Problem Relation Age of Onset   Lung cancer Mother    Diabetes Mother    Bipolar disorder Father    Liver cancer Maternal Grandfather  Diabetes Maternal Aunt        x 2   Pancreatic cancer Maternal Uncle    Prostate cancer Maternal Uncle    Breast cancer Other        maternal great aunt   Heart disease Other    Ovarian cancer Other        maternal great aunt   Kidney disease Other        maternal great aunt   Colon cancer Neg Hx    Stomach cancer Neg Hx     Social History Social History   Tobacco Use   Smoking status: Never   Smokeless tobacco: Never  Vaping Use   Vaping status: Never Used  Substance Use Topics   Alcohol use: Yes    Alcohol/week: 1.0 standard drink of alcohol    Types: 1 Shots of liquor per week    Comment: occasional    Drug use: No   Allergies Ibuprofen and Lisinopril  Review of Systems Review of Systems  Physical Exam Vital Signs  I have reviewed the triage vital signs BP (!) 176/112   Pulse (!) 110   Temp 98.4 F (36.9 C) (Oral)   Resp 17   Ht 5\' 8"  (1.727 m)   Wt 62.6 kg   SpO2 97%   BMI 20.98 kg/m   Physical Exam Vitals reviewed.  Abdominal:     General: Abdomen is flat. Bowel sounds are normal. There is no distension.     Palpations: Abdomen is soft.     Tenderness: There is abdominal tenderness in the epigastric area.  Neurological:     Mental Status: She is alert.     ED Results and Treatments Labs (all labs ordered are listed, but only abnormal results are displayed) Labs Reviewed  COMPREHENSIVE METABOLIC PANEL - Abnormal; Notable for the following components:      Result Value   Glucose, Bld 151 (*)    Calcium 8.6 (*)    Total Protein 5.5 (*)    Albumin 1.6 (*)     Alkaline Phosphatase 128 (*)    All other components within normal limits  CBC - Abnormal; Notable for the following components:   RBC 3.86 (*)    Hemoglobin 10.9 (*)    HCT 34.6 (*)    Platelets 480 (*)    All other components within normal limits  URINALYSIS, ROUTINE W REFLEX MICROSCOPIC - Abnormal; Notable for the following components:   APPearance HAZY (*)    Glucose, UA 150 (*)    Protein, ur >=300 (*)    Bacteria, UA MANY (*)    Non Squamous Epithelial 0-5 (*)    All other components within normal limits  LIPASE, BLOOD  HCG, SERUM, QUALITATIVE                                                                                                                          Radiology No results found.  Pertinent labs & imaging results  that were available during my care of the patient were reviewed by me and considered in my medical decision making (see MDM for details).  Medications Ordered in ED Medications  droperidol (INAPSINE) 2.5 MG/ML injection 2.5 mg (2.5 mg Intravenous Given 03/23/23 1631)  diphenhydrAMINE (BENADRYL) injection 25 mg (25 mg Intravenous Given 03/23/23 1631)  sodium chloride 0.9 % bolus 1,000 mL (1,000 mLs Intravenous New Bag/Given 03/23/23 1847)  promethazine (PHENERGAN) 12.5 mg in sodium chloride 0.9 % 50 mL IVPB (12.5 mg Intravenous New Bag/Given 03/23/23 1847)  LORazepam (ATIVAN) injection 1 mg (1 mg Intravenous Given 03/23/23 1847)                                                                                                                                     Procedures Procedures  (including critical care time)  Medical Decision Making / ED Course   This patient presents to the ED for concern of abdominal pain, this involves an extensive number of treatment options, and is a complaint that carries with it a high risk of complications and morbidity.  The differential diagnosis includes gastroparesis, less likely bowel obstruction less likely acute  intra-abdominal infection, less likely cholecystitis, cyclic biliary colic.  MDM: 33 year old female here today for abdominal pain.  On exam, patient quite tearful.  She has multiple visits and hospital admissions for gastroparesis.  Will attempt to treat the patient with IV medications.  Patient did request IV Dilaudid, counseled the patient on how this was likely not going to be beneficial for her current presentation would likely exacerbate symptoms.  Reassessment 7:40 PM-was able to improve the patient's symptoms with droperidol, Phenergan.  Patient currently resting, is not having any vomiting.  Will discharge.   Additional history obtained: -Additional history obtained from  -External records from outside source obtained and reviewed including: Chart review including previous notes, labs, imaging, consultation notes   Lab Tests: -I ordered, reviewed, and interpreted labs.   The pertinent results include:   Labs Reviewed  COMPREHENSIVE METABOLIC PANEL - Abnormal; Notable for the following components:      Result Value   Glucose, Bld 151 (*)    Calcium 8.6 (*)    Total Protein 5.5 (*)    Albumin 1.6 (*)    Alkaline Phosphatase 128 (*)    All other components within normal limits  CBC - Abnormal; Notable for the following components:   RBC 3.86 (*)    Hemoglobin 10.9 (*)    HCT 34.6 (*)    Platelets 480 (*)    All other components within normal limits  URINALYSIS, ROUTINE W REFLEX MICROSCOPIC - Abnormal; Notable for the following components:   APPearance HAZY (*)    Glucose, UA 150 (*)    Protein, ur >=300 (*)    Bacteria, UA MANY (*)    Non Squamous Epithelial 0-5 (*)    All other components within normal limits  LIPASE,  BLOOD  HCG, SERUM, QUALITATIVE      EKG my independent review the patient's EKG shows no ST segment depressions or elevations, no T wave versions, no evidence of acute ischemia.  EKG Interpretation Date/Time:  Monday March 23 2023 16:28:04  EST Ventricular Rate:  109 PR Interval:  155 QRS Duration:  74 QT Interval:  273 QTC Calculation: 368 R Axis:   64  Text Interpretation: Sinus tachycardia Probable left atrial enlargement Anteroseptal infarct, old Abnormal T, consider ischemia, lateral leads Confirmed by Anders Simmonds [phone]) on 03/23/2023 6:29:35 PM        Medicines ordered and prescription drug management: Meds ordered this encounter  Medications   droperidol (INAPSINE) 2.5 MG/ML injection 2.5 mg   diphenhydrAMINE (BENADRYL) injection 25 mg   sodium chloride 0.9 % bolus 1,000 mL   promethazine (PHENERGAN) 12.5 mg in sodium chloride 0.9 % 50 mL IVPB   LORazepam (ATIVAN) injection 1 mg   metoCLOPramide (REGLAN) 10 MG tablet    Sig: Take 1 tablet (10 mg total) by mouth every 6 (six) hours.    Dispense:  30 tablet    Refill:  0    -I have reviewed the patients home medicines and have made adjustments as needed   Cardiac Monitoring: The patient was maintained on a cardiac monitor.  I personally viewed and interpreted the cardiac monitored which showed an underlying rhythm of: Sinus rhythm  Social Determinants of Health:  Factors impacting patients care include: Poor health literacy, lack of access to primary care   Reevaluation: After the interventions noted above, I reevaluated the patient and found that they have :improved  Co morbidities that complicate the patient evaluation  Past Medical History:  Diagnosis Date   Acute H. pylori gastric ulcer    Coffee ground emesis    Diabetes mellitus (HCC)    Diabetic gastroparesis (HCC)    DKA (diabetic ketoacidosis) (HCC) 02/24/2021   Gastroparesis    GERD (gastroesophageal reflux disease)    Hypertension    Hyperthyroidism    Intractable nausea and vomiting 04/20/2021   Normocytic anemia 04/20/2020   Prolonged Q-T interval on ECG       Dispostion: I considered admission for this patient, however patient's symptoms improved with  treatment.     Final Clinical Impression(s) / ED Diagnoses Final diagnoses:  Gastroparesis     @PCDICTATION @    Anders Simmonds T, DO 03/23/23 1945

## 2023-03-23 NOTE — ED Notes (Signed)
Provider not doing any further meds. Pt will be d/c with [facility].

## 2023-03-23 NOTE — ED Notes (Signed)
[facility] transportation setup and all necessary paperwork printed.

## 2023-03-23 NOTE — Discharge Instructions (Signed)
You can take 10 mg of Reglan every 8 hours for abdominal pain and gastroparesis.  Please follow-up with your primary care doctor.

## 2023-03-23 NOTE — ED Notes (Signed)
Patient requesting pain meds.

## 2023-03-23 NOTE — ED Triage Notes (Signed)
Patient is here for evaluation of abdominal pain with nausea and vomiting X 1 hour. Reports history of gastroparesis. No other complaints.

## 2023-03-24 ENCOUNTER — Emergency Department (HOSPITAL_COMMUNITY): Payer: 59

## 2023-03-24 ENCOUNTER — Inpatient Hospital Stay (HOSPITAL_COMMUNITY)
Admission: EM | Admit: 2023-03-24 | Discharge: 2023-03-28 | DRG: 074 | Disposition: A | Discharge status: Home Health | Payer: 59 | Attending: Internal Medicine | Admitting: Internal Medicine

## 2023-03-24 DIAGNOSIS — E10319 Type 1 diabetes mellitus with unspecified diabetic retinopathy without macular edema: Secondary | ICD-10-CM | POA: Diagnosis present

## 2023-03-24 DIAGNOSIS — E10649 Type 1 diabetes mellitus with hypoglycemia without coma: Secondary | ICD-10-CM | POA: Diagnosis present

## 2023-03-24 DIAGNOSIS — I152 Hypertension secondary to endocrine disorders: Secondary | ICD-10-CM | POA: Diagnosis present

## 2023-03-24 DIAGNOSIS — Z888 Allergy status to other drugs, medicaments and biological substances status: Secondary | ICD-10-CM

## 2023-03-24 DIAGNOSIS — E876 Hypokalemia: Secondary | ICD-10-CM | POA: Diagnosis present

## 2023-03-24 DIAGNOSIS — R112 Nausea with vomiting, unspecified: Secondary | ICD-10-CM | POA: Diagnosis present

## 2023-03-24 DIAGNOSIS — E1065 Type 1 diabetes mellitus with hyperglycemia: Secondary | ICD-10-CM | POA: Diagnosis not present

## 2023-03-24 DIAGNOSIS — Z89612 Acquired absence of left leg above knee: Secondary | ICD-10-CM

## 2023-03-24 DIAGNOSIS — I129 Hypertensive chronic kidney disease with stage 1 through stage 4 chronic kidney disease, or unspecified chronic kidney disease: Secondary | ICD-10-CM | POA: Diagnosis present

## 2023-03-24 DIAGNOSIS — E1143 Type 2 diabetes mellitus with diabetic autonomic (poly)neuropathy: Secondary | ICD-10-CM | POA: Diagnosis present

## 2023-03-24 DIAGNOSIS — F418 Other specified anxiety disorders: Secondary | ICD-10-CM | POA: Diagnosis present

## 2023-03-24 DIAGNOSIS — K449 Diaphragmatic hernia without obstruction or gangrene: Secondary | ICD-10-CM | POA: Diagnosis present

## 2023-03-24 DIAGNOSIS — F419 Anxiety disorder, unspecified: Secondary | ICD-10-CM | POA: Diagnosis present

## 2023-03-24 DIAGNOSIS — Z833 Family history of diabetes mellitus: Secondary | ICD-10-CM

## 2023-03-24 DIAGNOSIS — R109 Unspecified abdominal pain: Principal | ICD-10-CM

## 2023-03-24 DIAGNOSIS — K219 Gastro-esophageal reflux disease without esophagitis: Secondary | ICD-10-CM | POA: Diagnosis present

## 2023-03-24 DIAGNOSIS — E108 Type 1 diabetes mellitus with unspecified complications: Secondary | ICD-10-CM | POA: Diagnosis present

## 2023-03-24 DIAGNOSIS — Z794 Long term (current) use of insulin: Secondary | ICD-10-CM

## 2023-03-24 DIAGNOSIS — N179 Acute kidney failure, unspecified: Secondary | ICD-10-CM | POA: Diagnosis present

## 2023-03-24 DIAGNOSIS — G8929 Other chronic pain: Secondary | ICD-10-CM | POA: Diagnosis present

## 2023-03-24 DIAGNOSIS — Z89611 Acquired absence of right leg above knee: Secondary | ICD-10-CM

## 2023-03-24 DIAGNOSIS — F32A Depression, unspecified: Secondary | ICD-10-CM | POA: Diagnosis present

## 2023-03-24 DIAGNOSIS — E1022 Type 1 diabetes mellitus with diabetic chronic kidney disease: Secondary | ICD-10-CM | POA: Diagnosis present

## 2023-03-24 DIAGNOSIS — K3184 Gastroparesis: Secondary | ICD-10-CM | POA: Diagnosis present

## 2023-03-24 DIAGNOSIS — E1043 Type 1 diabetes mellitus with diabetic autonomic (poly)neuropathy: Principal | ICD-10-CM | POA: Diagnosis present

## 2023-03-24 DIAGNOSIS — Z79899 Other long term (current) drug therapy: Secondary | ICD-10-CM

## 2023-03-24 DIAGNOSIS — N1831 Chronic kidney disease, stage 3a: Secondary | ICD-10-CM | POA: Diagnosis present

## 2023-03-24 DIAGNOSIS — Z556 Problems related to health literacy: Secondary | ICD-10-CM

## 2023-03-24 DIAGNOSIS — E1159 Type 2 diabetes mellitus with other circulatory complications: Secondary | ICD-10-CM | POA: Diagnosis present

## 2023-03-24 DIAGNOSIS — E785 Hyperlipidemia, unspecified: Secondary | ICD-10-CM | POA: Diagnosis present

## 2023-03-24 DIAGNOSIS — G894 Chronic pain syndrome: Secondary | ICD-10-CM | POA: Diagnosis present

## 2023-03-24 DIAGNOSIS — K209 Esophagitis, unspecified without bleeding: Secondary | ICD-10-CM

## 2023-03-24 DIAGNOSIS — E1059 Type 1 diabetes mellitus with other circulatory complications: Secondary | ICD-10-CM | POA: Diagnosis present

## 2023-03-24 LAB — COMPREHENSIVE METABOLIC PANEL
ALT: 31 U/L (ref 0–44)
AST: 27 U/L (ref 15–41)
Albumin: 1.8 g/dL — ABNORMAL LOW (ref 3.5–5.0)
Alkaline Phosphatase: 131 U/L — ABNORMAL HIGH (ref 38–126)
Anion gap: 16 — ABNORMAL HIGH (ref 5–15)
BUN: 24 mg/dL — ABNORMAL HIGH (ref 6–20)
CO2: 21 mmol/L — ABNORMAL LOW (ref 22–32)
Calcium: 8.6 mg/dL — ABNORMAL LOW (ref 8.9–10.3)
Chloride: 106 mmol/L (ref 98–111)
Creatinine, Ser: 1.46 mg/dL — ABNORMAL HIGH (ref 0.44–1.00)
GFR, Estimated: 49 mL/min — ABNORMAL LOW (ref >60)
Glucose, Bld: 249 mg/dL — ABNORMAL HIGH (ref 70–99)
Potassium: 3 mmol/L — ABNORMAL LOW (ref 3.5–5.1)
Sodium: 143 mmol/L (ref 135–145)
Total Bilirubin: 1 mg/dL (ref 0.0–1.2)
Total Protein: 6.3 g/dL — ABNORMAL LOW (ref 6.5–8.1)

## 2023-03-24 LAB — BLOOD GAS, VENOUS
Acid-Base Excess: 3.1 mmol/L — ABNORMAL HIGH (ref 0.0–2.0)
Bicarbonate: 24.5 mmol/L (ref 20.0–28.0)
O2 Saturation: 56.2 %
Patient temperature: 37
pCO2, Ven: 28 mm[Hg] — ABNORMAL LOW (ref 44–60)
pH, Ven: 7.55 — ABNORMAL HIGH (ref 7.25–7.43)
pO2, Ven: 31 mm[Hg] — CL (ref 32–45)

## 2023-03-24 LAB — MAGNESIUM: Magnesium: 2 mg/dL (ref 1.7–2.4)

## 2023-03-24 LAB — LIPASE, BLOOD: Lipase: 39 U/L (ref 11–51)

## 2023-03-24 LAB — CBC WITH DIFFERENTIAL/PLATELET
Abs Immature Granulocytes: 0.04 10*3/uL (ref 0.00–0.07)
Basophils Absolute: 0 10*3/uL (ref 0.0–0.1)
Basophils Relative: 0 %
Eosinophils Absolute: 0 10*3/uL (ref 0.0–0.5)
Eosinophils Relative: 0 %
HCT: 36.5 % (ref 36.0–46.0)
Hemoglobin: 11.6 g/dL — ABNORMAL LOW (ref 12.0–15.0)
Immature Granulocytes: 0 %
Lymphocytes Relative: 28 %
Lymphs Abs: 3.8 10*3/uL (ref 0.7–4.0)
MCH: 28.1 pg (ref 26.0–34.0)
MCHC: 31.8 g/dL (ref 30.0–36.0)
MCV: 88.4 fL (ref 80.0–100.0)
Monocytes Absolute: 1 10*3/uL (ref 0.1–1.0)
Monocytes Relative: 8 %
Neutro Abs: 8.3 10*3/uL — ABNORMAL HIGH (ref 1.7–7.7)
Neutrophils Relative %: 64 %
Platelets: 567 10*3/uL — ABNORMAL HIGH (ref 150–400)
RBC: 4.13 MIL/uL (ref 3.87–5.11)
RDW: 15.3 % (ref 11.5–15.5)
WBC: 13.2 10*3/uL — ABNORMAL HIGH (ref 4.0–10.5)
nRBC: 0 % (ref 0.0–0.2)

## 2023-03-24 LAB — CBG MONITORING, ED: Glucose-Capillary: 200 mg/dL — ABNORMAL HIGH (ref 70–99)

## 2023-03-24 LAB — HCG, SERUM, QUALITATIVE: Preg, Serum: NEGATIVE

## 2023-03-24 MED ORDER — IOHEXOL 300 MG/ML  SOLN
100.0000 mL | Freq: Once | INTRAMUSCULAR | Status: AC | PRN
  Administered 2023-03-24: 100 mL via INTRAVENOUS

## 2023-03-24 MED ORDER — ACETAMINOPHEN 650 MG RE SUPP: 650.0000 mg | Freq: Four times a day (QID) | RECTAL | Status: DC | PRN

## 2023-03-24 MED ORDER — LABETALOL HCL 5 MG/ML IV SOLN
10.0000 mg | INTRAVENOUS | Status: DC | PRN
  Filled 2023-03-24: qty 4

## 2023-03-24 MED ORDER — METOCLOPRAMIDE HCL 10 MG PO TABS
10.0000 mg | ORAL_TABLET | Freq: Once | ORAL | Status: AC
  Administered 2023-03-24: 10 mg via ORAL
  Filled 2023-03-24: qty 1

## 2023-03-24 MED ORDER — SODIUM CHLORIDE 0.9 % IV BOLUS
1000.0000 mL | Freq: Once | INTRAVENOUS | Status: AC
  Administered 2023-03-24: 1000 mL via INTRAVENOUS

## 2023-03-24 MED ORDER — POTASSIUM CHLORIDE CRYS ER 20 MEQ PO TBCR
40.0000 meq | EXTENDED_RELEASE_TABLET | Freq: Once | ORAL | Status: AC
  Administered 2023-03-24: 40 meq via ORAL
  Filled 2023-03-24: qty 4

## 2023-03-24 MED ORDER — DICYCLOMINE HCL 10 MG/ML IM SOLN
20.0000 mg | Freq: Once | INTRAMUSCULAR | Status: AC
Start: 2023-03-24 — End: 2023-03-24
  Administered 2023-03-24: 20 mg via INTRAMUSCULAR
  Filled 2023-03-24: qty 2

## 2023-03-24 MED ORDER — HYDROMORPHONE HCL 1 MG/ML IJ SOLN
1.0000 mg | INTRAMUSCULAR | Status: AC | PRN
  Administered 2023-03-24 – 2023-03-25 (×2): 1 mg via INTRAVENOUS
  Filled 2023-03-24 (×2): qty 1

## 2023-03-24 MED ORDER — INSULIN ASPART 100 UNIT/ML IJ SOLN
0.0000 [IU] | Freq: Three times a day (TID) | INTRAMUSCULAR | Status: DC
Start: 2023-03-25 — End: 2023-03-25
  Administered 2023-03-25: 3 [IU] via SUBCUTANEOUS
  Filled 2023-03-24: qty 0.15

## 2023-03-24 MED ORDER — HYDROMORPHONE HCL 1 MG/ML IJ SOLN
1.0000 mg | Freq: Once | INTRAMUSCULAR | Status: AC
  Administered 2023-03-24: 1 mg via INTRAVENOUS
  Filled 2023-03-24: qty 1

## 2023-03-24 MED ORDER — ONDANSETRON 4 MG PO TBDP
ORAL_TABLET | ORAL | Status: AC
  Filled 2023-03-24: qty 1

## 2023-03-24 MED ORDER — ACETAMINOPHEN 325 MG PO TABS
650.0000 mg | ORAL_TABLET | Freq: Four times a day (QID) | ORAL | Status: DC | PRN
  Administered 2023-03-25: 650 mg via ORAL
  Filled 2023-03-24: qty 2

## 2023-03-24 MED ORDER — ENOXAPARIN SODIUM 40 MG/0.4ML IJ SOSY
40.0000 mg | PREFILLED_SYRINGE | INTRAMUSCULAR | Status: DC
  Administered 2023-03-24 – 2023-03-27 (×4): 40 mg via SUBCUTANEOUS
  Filled 2023-03-24 (×4): qty 0.4

## 2023-03-24 MED ORDER — HYDROMORPHONE HCL 1 MG/ML IJ SOLN
1.0000 mg | Freq: Once | INTRAMUSCULAR | Status: AC
Start: 2023-03-24 — End: 2023-03-24
  Administered 2023-03-24: 1 mg via INTRAVENOUS
  Filled 2023-03-24: qty 1

## 2023-03-24 MED ORDER — INSULIN GLARGINE-YFGN 100 UNIT/ML ~~LOC~~ SOLN
8.0000 [IU] | Freq: Two times a day (BID) | SUBCUTANEOUS | Status: DC
Start: 2023-03-24 — End: 2023-03-26
  Administered 2023-03-24 – 2023-03-25 (×3): 8 [IU] via SUBCUTANEOUS
  Filled 2023-03-24 (×4): qty 0.08

## 2023-03-24 MED ORDER — POTASSIUM CHLORIDE 2 MEQ/ML IV SOLN
INTRAVENOUS | Status: AC
  Filled 2023-03-24: qty 1000

## 2023-03-24 MED ORDER — ONDANSETRON HCL 4 MG/2ML IJ SOLN
4.0000 mg | Freq: Once | INTRAMUSCULAR | Status: AC
  Administered 2023-03-24: 4 mg via INTRAVENOUS
  Filled 2023-03-24: qty 2

## 2023-03-24 MED ORDER — OXYCODONE HCL 5 MG PO TABS
5.0000 mg | ORAL_TABLET | Freq: Once | ORAL | Status: AC
  Administered 2023-03-24: 5 mg via ORAL
  Filled 2023-03-24: qty 1

## 2023-03-24 MED ORDER — PANTOPRAZOLE SODIUM 40 MG IV SOLR
40.0000 mg | Freq: Once | INTRAVENOUS | Status: AC
  Administered 2023-03-24: 40 mg via INTRAVENOUS
  Filled 2023-03-24: qty 10

## 2023-03-24 MED ORDER — PANTOPRAZOLE SODIUM 40 MG IV SOLR
40.0000 mg | Freq: Two times a day (BID) | INTRAVENOUS | Status: DC
  Administered 2023-03-25 – 2023-03-26 (×3): 40 mg via INTRAVENOUS
  Filled 2023-03-24 (×3): qty 10

## 2023-03-24 MED ORDER — METOCLOPRAMIDE HCL 5 MG/ML IJ SOLN
10.0000 mg | Freq: Once | INTRAMUSCULAR | Status: AC
  Administered 2023-03-24: 10 mg via INTRAMUSCULAR
  Filled 2023-03-24: qty 2

## 2023-03-24 MED ORDER — INSULIN ASPART 100 UNIT/ML IJ SOLN
0.0000 [IU] | Freq: Every day | INTRAMUSCULAR | Status: DC
  Filled 2023-03-24: qty 0.05

## 2023-03-24 MED ORDER — SODIUM CHLORIDE 0.9% FLUSH
3.0000 mL | Freq: Two times a day (BID) | INTRAVENOUS | Status: DC
  Administered 2023-03-24 – 2023-03-28 (×8): 3 mL via INTRAVENOUS

## 2023-03-24 MED ORDER — METOCLOPRAMIDE HCL 5 MG/ML IJ SOLN
10.0000 mg | Freq: Three times a day (TID) | INTRAMUSCULAR | Status: DC
  Administered 2023-03-24 – 2023-03-28 (×11): 10 mg via INTRAVENOUS
  Filled 2023-03-24 (×11): qty 2

## 2023-03-24 MED ORDER — PROCHLORPERAZINE EDISYLATE 10 MG/2ML IJ SOLN
10.0000 mg | Freq: Four times a day (QID) | INTRAMUSCULAR | Status: DC | PRN
  Administered 2023-03-25: 10 mg via INTRAVENOUS
  Filled 2023-03-24: qty 2

## 2023-03-24 MED ORDER — SENNOSIDES-DOCUSATE SODIUM 8.6-50 MG PO TABS: 1.0000 | ORAL_TABLET | Freq: Every evening | ORAL | Status: DC | PRN

## 2023-03-24 NOTE — H&P (Signed)
 History and Physical    Kelli Hudson FMW:[id number] DOB: [birth year] DOA: 03/24/2023  PCP: Arloa Jarvis, NP  Patient coming from: Home  I have personally briefly reviewed patient's old medical records in [facility]  Chief Complaint: Abdominal pain, nausea, vomiting  HPI: Kelli Hudson is a 33 y.o. female with medical history significant for type 1 diabetes complicated by retinopathy, neuropathy, and gastroparesis s/p gastric stimulator, CKD stage II-IIIa, HTN, chronic pain syndrome, s/p bilateral AKA, frequent admissions for intractable nausea and vomiting who presented to the ED for evaluation of abdominal pain, nausea, vomiting.  Patient with frequent hospital visits and admissions for recurrent episodes of intractable nausea and vomiting in setting of gastroparesis.  She returns with similar symptoms and upper abdominal pain.  Some improvement with meds given in the ED.  She is tolerating small sips of water  at time of admission. She denies chest pain, dyspnea, dysuria, diarrhea.  ED Course  Labs/Imaging on admission: I have personally reviewed following labs and imaging studies.  Initial vitals showed BP 148/110, pulse 131, RR 18, temp 98.4 F, SpO2 100% on room air.  Labs show sodium 143, potassium 3.0, bicarb 21, BUN 24, creatinine 1.46, serum glucose 249, AST 27, ALT 31, alk phos 131, total bilirubin 1.0, WBC 13.2, hemoglobin 11.6, platelets 5/67,000, lipase 39.  Serum hCG negative.  CT abdomen/pelvis with contrast showed small hiatal hernia with mild to moderate severity thickening of the visualized portion of the distal esophagus.  Moderate severity diffuse colonic wall thickening and mild to moderate severity diffuse anasarca noted.  Patient was given 1 L normal saline, oral K 40 mEq, IV Protonix  40 mg, IM Bentyl  20 mg, IV Dilaudid  1 mg x 2, oral and IM Reglan  10 mg, IV Zofran , Oxy IR 5 mg.  The hospitalist service was consulted to admit for  further evaluation and management.  Review of Systems: All systems reviewed and are negative except as documented in history of present illness above.   Past Medical History:  Diagnosis Date   Acute H. pylori gastric ulcer    Coffee ground emesis    Diabetes mellitus (HCC)    Diabetic gastroparesis (HCC)    DKA (diabetic ketoacidosis) (HCC) 02/24/2021   Gastroparesis    GERD (gastroesophageal reflux disease)    Hypertension    Hyperthyroidism    Intractable nausea and vomiting 04/20/2021   Normocytic anemia 04/20/2020   Prolonged Q-T interval on ECG     Past Surgical History:  Procedure Laterality Date   AMPUTATION Bilateral 10/24/2022   Procedure: BILATERAL BELOW KNEE AMPUTATION;  Surgeon: Harden Jerona GAILS, MD;  Location: [facility] OR;  Service: Orthopedics;  Laterality: Bilateral;   AMPUTATION Bilateral 10/08/2022   Procedure: BILATERAL LEG DEBRIDEMENT;  Surgeon: Harden Jerona GAILS, MD;  Location: [facility] OR;  Service: Orthopedics;  Laterality: Bilateral;   AMPUTATION Bilateral 11/14/2022   Procedure: BILATERAL ABOVE KNEE AMPUTATION;  Surgeon: Harden Jerona GAILS, MD;  Location: [facility] OR;  Service: Orthopedics;  Laterality: Bilateral;   AMPUTATION TOE Left 03/10/2018   Procedure: AMPUTATION FIFTH TOE;  Surgeon: Gershon Donnice SAUNDERS, DPM;  Location: [facility];  Service: Podiatry;  Laterality: Left;   APPLICATION OF WOUND VAC Bilateral 10/12/2022   Procedure: APPLICATION OF WOUND VAC BILATERAL LEGS;  Surgeon: Celena Sharper, MD;  Location: [facility] OR;  Service: Orthopedics;  Laterality: Bilateral;   APPLICATION OF WOUND VAC Bilateral 10/24/2022   Procedure: APPLICATION OF WOUND VAC;  Surgeon: Harden Jerona GAILS, MD;  Location: [facility] OR;  Service: Orthopedics;  Laterality: Bilateral;   BIOPSY  01/28/2020   Procedure: BIOPSY;  Surgeon: Elicia Claw, MD;  Location: [facility];  Service: Gastroenterology;;   BOTOX  INJECTION  08/26/2021   Procedure: BOTOX  INJECTION;  Surgeon: Legrand Victory LITTIE DOUGLAS, MD;   Location: [facility];  Service: Gastroenterology;;   ESOPHAGOGASTRODUODENOSCOPY N/A 01/28/2020   Procedure: ESOPHAGOGASTRODUODENOSCOPY (EGD);  Surgeon: Elicia Claw, MD;  Location: [facility];  Service: Gastroenterology;  Laterality: N/A;   ESOPHAGOGASTRODUODENOSCOPY (EGD) WITH PROPOFOL  Left 09/08/2015   Procedure: ESOPHAGOGASTRODUODENOSCOPY (EGD) WITH PROPOFOL ;  Surgeon: Elsie Cree, MD;  Location: [facility];  Service: Endoscopy;  Laterality: Left;   ESOPHAGOGASTRODUODENOSCOPY (EGD) WITH PROPOFOL  N/A 08/26/2021   Procedure: ESOPHAGOGASTRODUODENOSCOPY (EGD) WITH PROPOFOL ;  Surgeon: Legrand Victory LITTIE DOUGLAS, MD;  Location: [facility];  Service: Gastroenterology;  Laterality: N/A;   GASTRIC STIMULATOR IMPLANT SURGERY  06/2022   at [facility]   I & D EXTREMITY Bilateral 10/12/2022   Procedure: IRRIGATION AND  DEBRIDEMENT  BILATERAL LEGS;  Surgeon: Celena Sharper, MD;  Location: [facility] OR;  Service: Orthopedics;  Laterality: Bilateral;   I & D EXTREMITY Right 10/13/2022   Procedure: RIGHT THIGH WOUND VAC EXCHANGE;  Surgeon: Kendal Franky SQUIBB, MD;  Location: [facility] OR;  Service: Orthopedics;  Laterality: Right;   I & D EXTREMITY Bilateral 10/17/2022   Procedure: BILATERAL THIGH AND LEG DEBRIDEMENT, PARTIAL BILATERAL FOOT AMPUTATION;  Surgeon: Harden Jerona GAILS, MD;  Location: [facility] OR;  Service: Orthopedics;  Laterality: Bilateral;   I & D EXTREMITY Bilateral 11/05/2022   Procedure: BILATERAL THIGH DEBRIDEMENT;  Surgeon: Harden Jerona GAILS, MD;  Location: [facility] OR;  Service: Orthopedics;  Laterality: Bilateral;   I & D EXTREMITY Bilateral 11/07/2022   Procedure: BILATERAL THIGH AND LEG DEBRIDEMENT;  Surgeon: Harden Jerona GAILS, MD;  Location: [facility] OR;  Service: Orthopedics;  Laterality: Bilateral;   I & D EXTREMITY Bilateral 11/12/2022   Procedure: BILATERAL LEG DEBRIDEMENTS;  Surgeon: Harden Jerona GAILS, MD;  Location: [facility] OR;  Service: Orthopedics;  Laterality: Bilateral;   IRRIGATION AND DEBRIDEMENT FOOT Bilateral 10/05/2022    Procedure: IRRIGATION AND DEBRIDEMENT FOOT;  Surgeon: Janit Thresa HERO, DPM;  Location: [facility];  Service: Orthopedics/Podiatry;  Laterality: Bilateral;   WISDOM TOOTH EXTRACTION      Social History:  reports that she has never smoked. She has never used smokeless tobacco. She reports current alcohol use of about 1.0 standard drink of alcohol per week. She reports that she does not use drugs.  Allergies  Allergen Reactions   Ibuprofen  Other (See Comments)    07/23/22 - Worsening kidney function after 3 doses of ibuprofen  400mg .  Creatinine increased from 1.3 to 2.  Creatinine returned to normal after stopping.   Lisinopril  Cough, Other (See Comments), Itching and Rash    Breakout in rash    Family History  Problem Relation Age of Onset   Lung cancer Mother    Diabetes Mother    Bipolar disorder Father    Liver cancer Maternal Grandfather    Diabetes Maternal Aunt        x 2   Pancreatic cancer Maternal Uncle    Prostate cancer Maternal Uncle    Breast cancer Other        maternal great aunt   Heart disease Other    Ovarian cancer Other        maternal great aunt   Kidney disease Other        maternal great aunt   Colon cancer Neg Hx  Stomach cancer Neg Hx      Prior to Admission medications   Medication Sig Start Date End Date Taking? Authorizing Provider  acetaminophen  (TYLENOL ) 325 MG tablet Take 2 tablets (650 mg total) by mouth every 6 (six) hours as needed for mild pain, fever or headache. 11/22/22   Lue Elsie BROCKS, MD  amLODipine  (NORVASC ) 10 MG tablet Take 1 tablet (10 mg total) by mouth daily. 12/04/22   Elgergawy, Brayton RAMAN, MD  ascorbic acid  (VITAMIN C ) 1000 MG tablet Take 1 tablet (1,000 mg total) by mouth daily. Patient taking differently: Take 1,000 mg by mouth once a week. 11/22/22   Lue Elsie BROCKS, MD  carvedilol  (COREG ) 6.25 MG tablet Take 1 tablet (6.25 mg total) by mouth 2 (two) times daily with a meal. 12/03/22   Elgergawy, Brayton RAMAN, MD  Continuous  Glucose Transmitter (DEXCOM G6 TRANSMITTER) MISC 1 Device by Does not apply route as directed. 06/18/22   Shamleffer, Ibtehal Jaralla, MD  etonogestrel  (NEXPLANON ) 68 MG IMPL implant 1 each by Subdermal route continuous.    [provider]  famotidine  (PEPCID ) 20 MG tablet Take 1 tablet (20 mg total) by mouth 2 (two) times daily. 12/03/22   Elgergawy, Brayton RAMAN, MD  feeding supplement, GLUCERNA SHAKE, (GLUCERNA SHAKE) LIQD Take 237 mLs by mouth 3 (three) times daily between meals. 11/22/22   Lue Elsie BROCKS, MD  gabapentin  (NEURONTIN ) 300 MG capsule Take 1 capsule (300 mg total) by mouth every 8 (eight) hours. 11/22/22   Lue Elsie BROCKS, MD  hydrOXYzine  (ATARAX ) 10 MG tablet Take 1 tablet (10 mg total) by mouth 3 (three) times daily as needed for anxiety. 03/07/23   Krishnan, Gokul, MD  insulin  aspart (NOVOLOG ) 100 UNIT/ML injection Inject 0-9 Units into the skin 3 (three) times daily with meals. Patient taking differently: Inject 10-30 Units into the skin 3 (three) times daily with meals. 12/03/22   Elgergawy, Brayton RAMAN, MD  Insulin  Pen Needle 32G X 4 MM MISC 1 Device by Does not apply route in the morning, at noon, in the evening, and at bedtime. 06/18/22   Shamleffer, Donell Cardinal, MD  MAGNESIUM  PO Take 1 tablet by mouth daily.    [provider]  methocarbamol  (ROBAXIN ) 500 MG tablet Take 500 mg by mouth 3 (three) times daily as needed for muscle spasms. 11/22/22   [provider]  metoCLOPramide  (REGLAN ) 10 MG tablet Take 1 tablet (10 mg total) by mouth every 6 (six) hours. 03/23/23   Mannie Fairy DASEN, DO  Multiple Vitamin (MULTIVITAMIN WITH MINERALS) TABS tablet Take 1 tablet by mouth daily. 11/22/22   Lue Elsie BROCKS, MD  oxyCODONE  (OXY IR/ROXICODONE ) 5 MG immediate release tablet Take 1 tablet (5 mg total) by mouth every 6 (six) hours as needed for severe pain (pain score 7-10). 03/07/23   Krishnan, Gokul, MD  sucralfate  (CARAFATE ) 1 g tablet Take 1 g by mouth in  the morning, at noon, and at bedtime. 08/28/22   [provider]  traZODone  (DESYREL ) 150 MG tablet Take 150 mg by mouth at bedtime. 01/14/23   [provider]    Physical Exam: Vitals:   03/24/23 1954 03/24/23 2007 03/24/23 2304 03/25/23 0007  BP: (!) 171/116 (!) 189/116 (!) 174/114   Pulse: (!) 107 (!) 106 (!) 111   Resp: 18     Temp: 97.9 F (36.6 C) 98.1 F (36.7 C)  98.2 F (36.8 C)  TempSrc: Oral Oral  Oral  SpO2: 100% 99% 99%  Constitutional: Resting in bed, NAD, calm, comfortable Eyes: EOMI, lids and conjunctivae normal ENMT: Mucous membranes are dry. Posterior pharynx clear of any exudate or lesions.Normal dentition.  Neck: normal, supple, no masses. Respiratory: clear to auscultation bilaterally, no wheezing, no crackles. Normal respiratory effort. No accessory muscle use.  Cardiovascular: Mild tachycardia, no murmurs / rubs / gallops. No extremity edema. 2+ pedal pulses. Abdomen: Mild generalized tenderness, gastric stimulator left abdomen.   Musculoskeletal: no clubbing / cyanosis.  S/p bilateral AKA Skin: no rashes, lesions, ulcers. No induration Neurologic: Sensation intact. Strength 5/5 in all 4.  Psychiatric: Alert and oriented x 3. Normal mood.   EKG: Personally reviewed. Sinus rhythm, rate 109, no acute ischemic changes QTc 368.  Assessment/Plan Principal Problem:   Nausea and vomiting Active Problems:   Hypokalemia   Diabetic gastroparesis (HCC)   Acute kidney injury superimposed on chronic kidney disease (HCC)   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)   Hypertension associated with diabetes (HCC)   Depression with anxiety   Chronic pain   Esophagitis   Kelli Hudson is a 33 y.o. female with medical history significant for type 1 diabetes complicated by retinopathy, neuropathy, and gastroparesis s/p gastric stimulator, CKD stage II-IIIa, HTN, chronic pain syndrome, s/p bilateral AKA, frequent admissions for  intractable nausea and vomiting who is admitted for management of nausea and vomiting.  Assessment and Plan: Gastroparesis with intractable nausea and vomiting: Frequent admissions for the same.  S/p gastric stimulator placed in May 2024 at [facility]. -Placed on scheduled IV Reglan  10 mg every 8 hours -IV Compazine  as needed -Continue IV fluid hydration overnight -Advance diet as tolerated  Acute kidney injury superimposed on CKD stage II-IIIa: Creatinine 1.46 on admission.  Renal function fluctuates in setting of recurrent episodes of nausea/vomiting with GI losses and improved with IV fluid hydration when hospitalized.  Has at least baseline CKD stage II potentially progressed to IIIa. -Continue IV fluid hydration and repeat labs in a.m.  Type 1 diabetes: Last hemoglobin A1c 7.2% 03/02/2023. Placed on SSI and Semglee  8 units twice daily.  Hypokalemia: Supplementing.  Hypertension: Continue amlodipine  and carvedilol .  Distal esophagitis: CT imaging showed small hiatal hernia with mild to moderate severity thickening of the visualized portion of the distal esophagus.  She had an EGD when admitted at [facility] in [place] on 12/30/2022 which showed clean-based distal esophageal ulcers, retained fluid in the proximal stomach, and emetic injury in the gastric cardia.  Distal esophagus biopsy showed acute ulcerative esophagitis, no dysplasia or malignancy identified.  Stomach biopsy negative for H. pylori. -IV Protonix  40 mg BID -Continue sucralfate   Chronic pain syndrome: Continue gabapentin .  Depression/anxiety/sleep: Continue Atarax  prn and trazodone .  S/p bilateral AKA    DVT prophylaxis: enoxaparin  (LOVENOX ) injection 40 mg Start: 03/24/23 2200 Code Status: Full code Family Communication: Discussed with patient, she has discussed with family Disposition Plan: From home and likely discharge to home pending clinical progress Consults called: None Severity of  Illness: The appropriate patient status for this patient is OBSERVATION. Observation status is judged to be reasonable and necessary in order to provide the required intensity of service to ensure the patient's safety. The patient's presenting symptoms, physical exam findings, and initial radiographic and laboratory data in the context of their medical condition is felt to place them at decreased risk for further clinical deterioration. Furthermore, it is anticipated that the patient will be medically stable for discharge from the hospital within 2 midnights of admission.   Jeany Seville  Tobie MD [facility]  If 7PM-7AM, please contact night-coverage [email or web address]  03/25/2023, 12:53 AM

## 2023-03-24 NOTE — ED Notes (Signed)
 Pt refused vitals

## 2023-03-24 NOTE — ED Notes (Signed)
 Refused blood draw.

## 2023-03-24 NOTE — ED Notes (Signed)
Pt wheeled to the bathroom, upon returning back to bedside, Pt is sticking her fingers down her throat inducing herself to vomit. Pt educated that is not appropriate.

## 2023-03-24 NOTE — ED Notes (Signed)
Pt refused blood work " ima hard stick , I already been triage when I got here"

## 2023-03-24 NOTE — Hospital Course (Addendum)
 33 y.o. 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 In the ED vitals stable although tachycardic up to 131 initially, afebrile, not hypoxic, labs showed hypokalemia metabolic acidosis creatinine 1.4 hyperglycemia 249 normal LFTs mild leukocytosis 13.2 serum hCG negative. CT abdomen/pelvis with contrast>>small hiatal hernia with mild to moderate severity thickening of the visualized portion of the distal esophagus.Moderate severity diffuse colonic wall thickening and mild to moderate severity diffuse anasarca noted.  Patient was given IV fluids PPI antiemetics pain medication potassium supplementation and admission was requested. She had elevated creatinine In the setting of nausea vomiting gastroparesis: Continued on IV fluid hydration. Plan is for discharge home once creatinine better and tolerating diet.

## 2023-03-24 NOTE — ED Notes (Signed)
Passing by pt in tr 1, this tech saw her sticking her fingers down her throat, attempting to throw up.

## 2023-03-24 NOTE — ED Notes (Signed)
Pt refused recheck vitals °

## 2023-03-24 NOTE — ED Provider Triage Note (Addendum)
 Emergency Medicine Provider Triage Evaluation Note  Kelli Hudson , a 33 y.o. female  was evaluated in triage.  Pt complains of vomiting.  Patient endorses diffuse abdominal pain and nausea/vomiting for the past several days.  Was seen yesterday and discharged home.  Was not able to fill her Reglan  prescription.  Is requesting for Dilaudid  for pain.  Review of Systems  Positive: Nausea, vomiting, abdominal pain Negative: Diarrhea, constipation  Physical Exam  There were no vitals taken for this visit. Gen:   Awake, no distress   Resp:  Normal effort  MSK:   Moves extremities without difficulty  Other:    Medical Decision Making  Medically screening exam initiated at 7:02 AM.  Appropriate orders placed.  Kelli Hudson was informed that the remainder of the evaluation will be completed by another provider, this initial triage assessment does not replace that evaluation, and the importance of remaining in the ED until their evaluation is complete.    Waddell Sluder, PA-C 03/24/23 0707    Waddell Sluder, PA-C 03/24/23 [phone]

## 2023-03-24 NOTE — ED Triage Notes (Signed)
N/V X yesterday, was discharged feeling unwell. Prescribed Reglan, unable to fill med. Pt in pain 10/10 in abdomen

## 2023-03-24 NOTE — ED Provider Notes (Signed)
 [place] EMERGENCY DEPARTMENT AT [facility] Provider Note   CSN: [id number] Arrival date & time: 03/24/23  9346     History  Chief Complaint  Patient presents with   Emesis   HPI Kelli Hudson is a 33 y.o. female with history of type 1 diabetes, hypertension, diabetic gastroparesis presenting for nausea and emesis and abdominal pain. Started 2 days ago. Abdominal pain is all about the mid abdomen.  States she had a normal bowel movement yesterday.  States she has persistently been vomiting and cannot keep anything down.  Denies fever.  Denies sick contacts or recent long trips.  Denies marijuana use.   Emesis      Home Medications Prior to Admission medications   Medication Sig Start Date End Date Taking? Authorizing Provider  acetaminophen  (TYLENOL ) 325 MG tablet Take 2 tablets (650 mg total) by mouth every 6 (six) hours as needed for mild pain, fever or headache. 11/22/22   Lue Elsie BROCKS, MD  amLODipine  (NORVASC ) 10 MG tablet Take 1 tablet (10 mg total) by mouth daily. 12/04/22   Elgergawy, Brayton RAMAN, MD  ascorbic acid  (VITAMIN C ) 1000 MG tablet Take 1 tablet (1,000 mg total) by mouth daily. Patient taking differently: Take 1,000 mg by mouth once a week. 11/22/22   Lue Elsie BROCKS, MD  carvedilol  (COREG ) 6.25 MG tablet Take 1 tablet (6.25 mg total) by mouth 2 (two) times daily with a meal. 12/03/22   Elgergawy, Brayton RAMAN, MD  Continuous Glucose Transmitter (DEXCOM G6 TRANSMITTER) MISC 1 Device by Does not apply route as directed. 06/18/22   Shamleffer, Ibtehal Jaralla, MD  etonogestrel  (NEXPLANON ) 68 MG IMPL implant 1 each by Subdermal route continuous.    [provider]  famotidine  (PEPCID ) 20 MG tablet Take 1 tablet (20 mg total) by mouth 2 (two) times daily. 12/03/22   Elgergawy, Brayton RAMAN, MD  feeding supplement, GLUCERNA SHAKE, (GLUCERNA SHAKE) LIQD Take 237 mLs by mouth 3 (three) times daily between meals. 11/22/22   Lue Elsie BROCKS, MD  gabapentin  (NEURONTIN ) 300 MG capsule Take 1 capsule (300 mg total) by mouth every 8 (eight) hours. 11/22/22   Lue Elsie BROCKS, MD  hydrOXYzine  (ATARAX ) 10 MG tablet Take 1 tablet (10 mg total) by mouth 3 (three) times daily as needed for anxiety. 03/07/23   Krishnan, Gokul, MD  insulin  aspart (NOVOLOG ) 100 UNIT/ML injection Inject 0-9 Units into the skin 3 (three) times daily with meals. Patient taking differently: Inject 10-30 Units into the skin 3 (three) times daily with meals. 12/03/22   Elgergawy, Brayton RAMAN, MD  Insulin  Pen Needle 32G X 4 MM MISC 1 Device by Does not apply route in the morning, at noon, in the evening, and at bedtime. 06/18/22   Shamleffer, Donell Cardinal, MD  MAGNESIUM  PO Take 1 tablet by mouth daily.    [provider]  methocarbamol  (ROBAXIN ) 500 MG tablet Take 500 mg by mouth 3 (three) times daily as needed for muscle spasms. 11/22/22   [provider]  metoCLOPramide  (REGLAN ) 10 MG tablet Take 1 tablet (10 mg total) by mouth every 6 (six) hours. 03/23/23   Mannie Fairy DASEN, DO  Multiple Vitamin (MULTIVITAMIN WITH MINERALS) TABS tablet Take 1 tablet by mouth daily. 11/22/22   Lue Elsie BROCKS, MD  oxyCODONE  (OXY IR/ROXICODONE ) 5 MG immediate release tablet Take 1 tablet (5 mg total) by mouth every 6 (six) hours as needed for severe pain (pain score 7-10). 03/07/23   Krishnan, Gokul, MD  sucralfate  (CARAFATE ) 1 g tablet Take 1 g by mouth in the morning, at noon, and at bedtime. 08/28/22   [provider]  traZODone  (DESYREL ) 150 MG tablet Take 150 mg by mouth at bedtime. 01/14/23   [provider]      Allergies    Ibuprofen  and Lisinopril     Review of Systems   Review of Systems  Gastrointestinal:  Positive for vomiting.    Physical Exam   Vitals:   03/24/23 1954 03/24/23 2007  BP: (!) 171/116 (!) 189/116  Pulse: (!) 107 (!) 106  Resp: 18   Temp: 97.9 F (36.6 C) 98.1 F (36.7 C)  SpO2: 100% 99%     CONSTITUTIONAL:  well-appearing, NAD NEURO:  Alert and oriented x 3, CN 3-12 grossly intact EYES:  eyes equal and reactive ENT/NECK:  Supple, no stridor  CARDIO: regular rate and rhythm, appears well-perfused  PULM:  No respiratory distress, CTAB GI/GU:  non-distended, soft, generalized tenderness MSK/SPINE:  No gross deformities, no edema, moves all extremities  SKIN:  no rash, atraumatic  *Additional and/or pertinent findings included in MDM below  ED Results / Procedures / Treatments   Labs (all labs ordered are listed, but only abnormal results are displayed) Labs Reviewed  COMPREHENSIVE METABOLIC PANEL - Abnormal; Notable for the following components:      Result Value   Potassium 3.0 (*)    CO2 21 (*)    Glucose, Bld 249 (*)    BUN 24 (*)    Creatinine, Ser 1.46 (*)    Calcium  8.6 (*)    Total Protein 6.3 (*)    Albumin  1.8 (*)    Alkaline Phosphatase 131 (*)    GFR, Estimated 49 (*)    Anion gap 16 (*)    All other components within normal limits  CBC WITH DIFFERENTIAL/PLATELET - Abnormal; Notable for the following components:   WBC 13.2 (*)    Hemoglobin 11.6 (*)    Platelets 567 (*)    Neutro Abs 8.3 (*)    All other components within normal limits  BLOOD GAS, VENOUS - Abnormal; Notable for the following components:   pH, Ven 7.55 (*)    pCO2, Ven 28 (*)    pO2, Ven 31 (*)    Acid-Base Excess 3.1 (*)    All other components within normal limits  CBG MONITORING, ED - Abnormal; Notable for the following components:   Glucose-Capillary 200 (*)    All other components within normal limits  LIPASE, BLOOD  HCG, SERUM, QUALITATIVE  MAGNESIUM   CBC  BASIC METABOLIC PANEL    EKG None  Radiology CT ABDOMEN PELVIS W CONTRAST Result Date: 03/24/2023 CLINICAL DATA:  Abdominal pain. EXAM: CT ABDOMEN AND PELVIS WITH CONTRAST TECHNIQUE: Multidetector CT imaging of the abdomen and pelvis was performed using the standard protocol following bolus administration of  intravenous contrast. RADIATION DOSE REDUCTION: This exam was performed according to the departmental dose-optimization program which includes automated exposure control, adjustment of the mA and/or kV according to patient size and/or use of iterative reconstruction technique. CONTRAST:  OMNIPAQUE  IOHEXOL  300 MG/ML  SOLN COMPARISON:  November 28, 2022 FINDINGS: Lower chest: No acute abnormality. Hepatobiliary: No focal liver abnormality is seen. No gallstones, gallbladder wall thickening, or biliary dilatation. Pancreas: Unremarkable. No pancreatic ductal dilatation or surrounding inflammatory changes. Spleen: Normal in size without focal abnormality. Adrenals/Urinary Tract: Adrenal glands are unremarkable. Kidneys are normal, without renal calculi, focal lesion, or hydronephrosis. The urinary bladder is markedly  distended and is otherwise unremarkable. Stomach/Bowel: There is a small hiatal hernia with mild to moderate severity thickening of the visualized portion of the distal esophagus. Stable gastric stimulator implant positioning is seen. Appendix appears normal. No evidence of bowel dilatation. Moderate severity diffuse colonic wall thickening is noted. Poorly distended large bowel is also seen. Vascular/Lymphatic: No significant vascular findings are present. No enlarged abdominal or pelvic lymph nodes. Reproductive: Uterus and bilateral adnexa are unremarkable. Other: There is mild to moderate severity diffuse anasarca. No abdominopelvic ascites. Musculoskeletal: No acute or significant osseous findings. IMPRESSION: 1. Small hiatal hernia with mild to moderate severity thickening of the visualized portion of the distal esophagus. While this may represent sequelae associated with distal esophagitis, GI consultation is recommended. 2. Moderate severity diffuse colonic wall thickening which may represent sequelae associated with colitis. 3. Mild to moderate severity diffuse anasarca. Electronically Signed    By: Suzen Dials M.D.   On: 03/24/2023 17:57    Procedures Procedures    Medications Ordered in ED Medications  HYDROmorphone  (DILAUDID ) injection 1 mg (has no administration in time range)  metoCLOPramide  (REGLAN ) injection 10 mg (10 mg Intravenous Given 03/24/23 2254)  enoxaparin  (LOVENOX ) injection 40 mg (40 mg Subcutaneous Given 03/24/23 2254)  sodium chloride  flush (NS) 0.9 % injection 3 mL (3 mLs Intravenous Given 03/24/23 2254)  acetaminophen  (TYLENOL ) tablet 650 mg (has no administration in time range)    Or  acetaminophen  (TYLENOL ) suppository 650 mg (has no administration in time range)  lactated ringers  1,000 mL with potassium chloride  20 mEq infusion ( Intravenous New Bag/Given 03/24/23 2232)  prochlorperazine  (COMPAZINE ) injection 10 mg (has no administration in time range)  senna-docusate (Senokot-S) tablet 1 tablet (has no administration in time range)  pantoprazole  (PROTONIX ) injection 40 mg (has no administration in time range)  insulin  aspart (novoLOG ) injection 0-5 Units ( Subcutaneous Not Given 03/24/23 2300)  insulin  aspart (novoLOG ) injection 0-15 Units (has no administration in time range)  insulin  glargine-yfgn (SEMGLEE ) injection 8 Units (8 Units Subcutaneous Given 03/24/23 2254)  dicyclomine  (BENTYL ) injection 20 mg (20 mg Intramuscular Given 03/24/23 0717)  metoCLOPramide  (REGLAN ) tablet 10 mg (10 mg Oral Given 03/24/23 0717)  metoCLOPramide  (REGLAN ) injection 10 mg (10 mg Intramuscular Given 03/24/23 1025)  HYDROmorphone  (DILAUDID ) injection 1 mg (1 mg Intravenous Given 03/24/23 1615)  sodium chloride  0.9 % bolus 1,000 mL (0 mLs Intravenous Stopped 03/24/23 1745)  ondansetron  (ZOFRAN ) injection 4 mg (4 mg Intravenous Given 03/24/23 1616)  iohexol  (OMNIPAQUE ) 300 MG/ML solution 100 mL (100 mLs Intravenous Contrast Given 03/24/23 1733)  oxyCODONE  (Oxy IR/ROXICODONE ) immediate release tablet 5 mg (5 mg Oral Given 03/24/23 1954)  potassium chloride  SA (KLOR-CON  M) CR tablet 40 mEq  (40 mEq Oral Given 03/24/23 2025)  HYDROmorphone  (DILAUDID ) injection 1 mg (1 mg Intravenous Given 03/24/23 2025)  pantoprazole  (PROTONIX ) injection 40 mg (40 mg Intravenous Given 03/24/23 2025)    ED Course/ Medical Decision Making/ A&P                                 Medical Decision Making Amount and/or Complexity of Data Reviewed Labs: ordered. Radiology: ordered.  Risk Prescription drug management. Decision regarding hospitalization.   Initial Impression and Ddx 34 year old well-appearing female presenting for emesis and abdominal pain.  Exam notable for generalized abdominal tenderness.  DDx includes gastroparesis, intraabdominal infection, DKA, electrolyte derangement, sepsis, other. Patient PMH that increases complexity of ED encounter:  history of  type 1 diabetes, hypertension, diabetic gastroparesis   Interpretation of Diagnostics - I independent reviewed and interpreted the labs as followed: Leukocytosis (13.2), reduced GFR, anion gap, hyperglycemia  - I independently visualized the following imaging with scope of interpretation limited to determining acute life threatening conditions related to emergency care: CT ab/pelvis, which revealed findings consistent with distal esophagitis and possible colitis  Patient Reassessment and Ultimate Disposition/Management On reassessment, patient was notably still tachycardic and blood pressure rising and she was still in a consider amount of pain.  Suspect the blood pressure and tachycardia are likely related to her pain.  Also workup suggestive AKI suspect secondary to vomiting at home.  Treated again with Dilaudid  and antiemetics.  Admitted to hospital service with Dr. Tobie for abdominal pain likely related to her gastroparesis.  Also patient requesting home health at discharge.  Patient management required discussion with the following services or consulting groups:  Hospitalist Service  Complexity of Problems Addressed Acute  complicated illness or Injury  Additional Data Reviewed and Analyzed Further history obtained from: Further history from spouse/family member, Past medical history and medications listed in the EMR, and Prior ED visit notes  Patient Encounter Risk Assessment Consideration of hospitalization         Final Clinical Impression(s) / ED Diagnoses Final diagnoses:  Abdominal pain, unspecified abdominal location    Rx / DC Orders ED Discharge Orders     None         Lang Norleen POUR, PA-C 03/24/23 2304    Suzette Pac, MD 03/25/23 [phone]

## 2023-03-25 DIAGNOSIS — K3184 Gastroparesis: Secondary | ICD-10-CM | POA: Diagnosis present

## 2023-03-25 DIAGNOSIS — Z89612 Acquired absence of left leg above knee: Secondary | ICD-10-CM | POA: Diagnosis not present

## 2023-03-25 DIAGNOSIS — E1059 Type 1 diabetes mellitus with other circulatory complications: Secondary | ICD-10-CM | POA: Diagnosis present

## 2023-03-25 DIAGNOSIS — E876 Hypokalemia: Secondary | ICD-10-CM | POA: Diagnosis present

## 2023-03-25 DIAGNOSIS — E785 Hyperlipidemia, unspecified: Secondary | ICD-10-CM | POA: Diagnosis present

## 2023-03-25 DIAGNOSIS — E10649 Type 1 diabetes mellitus with hypoglycemia without coma: Secondary | ICD-10-CM | POA: Diagnosis present

## 2023-03-25 DIAGNOSIS — Z794 Long term (current) use of insulin: Secondary | ICD-10-CM | POA: Diagnosis not present

## 2023-03-25 DIAGNOSIS — K219 Gastro-esophageal reflux disease without esophagitis: Secondary | ICD-10-CM | POA: Diagnosis present

## 2023-03-25 DIAGNOSIS — F32A Depression, unspecified: Secondary | ICD-10-CM | POA: Diagnosis present

## 2023-03-25 DIAGNOSIS — Z556 Problems related to health literacy: Secondary | ICD-10-CM | POA: Diagnosis not present

## 2023-03-25 DIAGNOSIS — E1022 Type 1 diabetes mellitus with diabetic chronic kidney disease: Secondary | ICD-10-CM | POA: Diagnosis present

## 2023-03-25 DIAGNOSIS — Z833 Family history of diabetes mellitus: Secondary | ICD-10-CM | POA: Diagnosis not present

## 2023-03-25 DIAGNOSIS — E1043 Type 1 diabetes mellitus with diabetic autonomic (poly)neuropathy: Secondary | ICD-10-CM | POA: Diagnosis present

## 2023-03-25 DIAGNOSIS — G894 Chronic pain syndrome: Secondary | ICD-10-CM | POA: Diagnosis present

## 2023-03-25 DIAGNOSIS — Z89611 Acquired absence of right leg above knee: Secondary | ICD-10-CM | POA: Diagnosis not present

## 2023-03-25 DIAGNOSIS — K449 Diaphragmatic hernia without obstruction or gangrene: Secondary | ICD-10-CM | POA: Diagnosis present

## 2023-03-25 DIAGNOSIS — R112 Nausea with vomiting, unspecified: Secondary | ICD-10-CM | POA: Diagnosis present

## 2023-03-25 DIAGNOSIS — Z79899 Other long term (current) drug therapy: Secondary | ICD-10-CM | POA: Diagnosis not present

## 2023-03-25 DIAGNOSIS — F419 Anxiety disorder, unspecified: Secondary | ICD-10-CM | POA: Diagnosis present

## 2023-03-25 DIAGNOSIS — Z888 Allergy status to other drugs, medicaments and biological substances status: Secondary | ICD-10-CM | POA: Diagnosis not present

## 2023-03-25 DIAGNOSIS — E10319 Type 1 diabetes mellitus with unspecified diabetic retinopathy without macular edema: Secondary | ICD-10-CM | POA: Diagnosis present

## 2023-03-25 DIAGNOSIS — N179 Acute kidney failure, unspecified: Secondary | ICD-10-CM | POA: Diagnosis present

## 2023-03-25 DIAGNOSIS — N1831 Chronic kidney disease, stage 3a: Secondary | ICD-10-CM | POA: Diagnosis present

## 2023-03-25 DIAGNOSIS — E1065 Type 1 diabetes mellitus with hyperglycemia: Secondary | ICD-10-CM | POA: Diagnosis not present

## 2023-03-25 DIAGNOSIS — I129 Hypertensive chronic kidney disease with stage 1 through stage 4 chronic kidney disease, or unspecified chronic kidney disease: Secondary | ICD-10-CM | POA: Diagnosis present

## 2023-03-25 LAB — BASIC METABOLIC PANEL
Anion gap: 7 (ref 5–15)
BUN: 23 mg/dL — ABNORMAL HIGH (ref 6–20)
CO2: 26 mmol/L (ref 22–32)
Calcium: 7.6 mg/dL — ABNORMAL LOW (ref 8.9–10.3)
Chloride: 107 mmol/L (ref 98–111)
Creatinine, Ser: 1.6 mg/dL — ABNORMAL HIGH (ref 0.44–1.00)
GFR, Estimated: 44 mL/min — ABNORMAL LOW (ref >60)
Glucose, Bld: 146 mg/dL — ABNORMAL HIGH (ref 70–99)
Potassium: 3.7 mmol/L (ref 3.5–5.1)
Sodium: 140 mmol/L (ref 135–145)

## 2023-03-25 LAB — GLUCOSE, CAPILLARY
Glucose-Capillary: 94 mg/dL (ref 70–99)
Glucose-Capillary: 142 mg/dL — ABNORMAL HIGH (ref 70–99)

## 2023-03-25 LAB — CBC
HCT: 30 % — ABNORMAL LOW (ref 36.0–46.0)
Hemoglobin: 9.1 g/dL — ABNORMAL LOW (ref 12.0–15.0)
MCH: 28.3 pg (ref 26.0–34.0)
MCHC: 30.3 g/dL (ref 30.0–36.0)
MCV: 93.5 fL (ref 80.0–100.0)
Platelets: 446 10*3/uL — ABNORMAL HIGH (ref 150–400)
RBC: 3.21 MIL/uL — ABNORMAL LOW (ref 3.87–5.11)
RDW: 16 % — ABNORMAL HIGH (ref 11.5–15.5)
WBC: 12 10*3/uL — ABNORMAL HIGH (ref 4.0–10.5)
nRBC: 0 % (ref 0.0–0.2)

## 2023-03-25 LAB — CBG MONITORING, ED
Glucose-Capillary: 156 mg/dL — ABNORMAL HIGH (ref 70–99)
Glucose-Capillary: 156 mg/dL — ABNORMAL HIGH (ref 70–99)

## 2023-03-25 MED ORDER — HYDROXYZINE HCL 10 MG PO TABS
10.0000 mg | ORAL_TABLET | Freq: Three times a day (TID) | ORAL | Status: DC | PRN
  Administered 2023-03-25 – 2023-03-27 (×4): 10 mg via ORAL
  Filled 2023-03-25 (×4): qty 1

## 2023-03-25 MED ORDER — GABAPENTIN 300 MG PO CAPS
300.0000 mg | ORAL_CAPSULE | Freq: Three times a day (TID) | ORAL | Status: DC
  Administered 2023-03-25 – 2023-03-28 (×11): 300 mg via ORAL
  Filled 2023-03-25 (×11): qty 1

## 2023-03-25 MED ORDER — HYDROMORPHONE HCL 1 MG/ML IJ SOLN
0.5000 mg | INTRAMUSCULAR | Status: DC | PRN
  Administered 2023-03-25 – 2023-03-28 (×18): 0.5 mg via INTRAVENOUS
  Filled 2023-03-25 (×8): qty 0.5
  Filled 2023-03-25 (×2): qty 1
  Filled 2023-03-25 (×8): qty 0.5

## 2023-03-25 MED ORDER — SUCRALFATE 1 G PO TABS
1.0000 g | ORAL_TABLET | Freq: Three times a day (TID) | ORAL | Status: DC
  Administered 2023-03-25 – 2023-03-28 (×11): 1 g via ORAL
  Filled 2023-03-25 (×11): qty 1

## 2023-03-25 MED ORDER — AMLODIPINE BESYLATE 10 MG PO TABS
10.0000 mg | ORAL_TABLET | Freq: Every day | ORAL | Status: DC
  Administered 2023-03-25 – 2023-03-28 (×4): 10 mg via ORAL
  Filled 2023-03-25: qty 2
  Filled 2023-03-25 (×3): qty 1

## 2023-03-25 MED ORDER — TRAZODONE HCL 50 MG PO TABS
150.0000 mg | ORAL_TABLET | Freq: Every day | ORAL | Status: DC
  Administered 2023-03-25 – 2023-03-27 (×4): 150 mg via ORAL
  Filled 2023-03-25 (×2): qty 1
  Filled 2023-03-25: qty 2
  Filled 2023-03-25: qty 1

## 2023-03-25 MED ORDER — CARVEDILOL 6.25 MG PO TABS
6.2500 mg | ORAL_TABLET | Freq: Two times a day (BID) | ORAL | Status: DC
  Administered 2023-03-25 – 2023-03-28 (×7): 6.25 mg via ORAL
  Filled 2023-03-25: qty 1
  Filled 2023-03-25: qty 2
  Filled 2023-03-25 (×4): qty 1
  Filled 2023-03-25: qty 2
  Filled 2023-03-25: qty 1

## 2023-03-25 MED ORDER — OXYCODONE HCL 5 MG PO TABS
5.0000 mg | ORAL_TABLET | Freq: Four times a day (QID) | ORAL | Status: DC | PRN
  Administered 2023-03-26 – 2023-03-28 (×3): 5 mg via ORAL
  Filled 2023-03-25 (×3): qty 1

## 2023-03-25 MED ORDER — INSULIN ASPART 100 UNIT/ML IJ SOLN
0.0000 [IU] | Freq: Three times a day (TID) | INTRAMUSCULAR | Status: DC
  Administered 2023-03-25: 2 [IU] via SUBCUTANEOUS
  Administered 2023-03-26 – 2023-03-27 (×3): 1 [IU] via SUBCUTANEOUS
  Administered 2023-03-27: 3 [IU] via SUBCUTANEOUS
  Administered 2023-03-28: 2 [IU] via SUBCUTANEOUS
  Filled 2023-03-25: qty 0.09

## 2023-03-25 MED ORDER — GLUCERNA SHAKE PO LIQD
237.0000 mL | Freq: Three times a day (TID) | ORAL | Status: DC
Start: 2023-03-25 — End: 2023-03-28
  Administered 2023-03-25 (×2): 237 mL via ORAL
  Filled 2023-03-25 (×13): qty 237

## 2023-03-25 MED ORDER — METHOCARBAMOL 500 MG PO TABS
500.0000 mg | ORAL_TABLET | Freq: Three times a day (TID) | ORAL | Status: DC | PRN
  Administered 2023-03-26 (×2): 500 mg via ORAL
  Filled 2023-03-25 (×3): qty 1

## 2023-03-25 NOTE — ED Notes (Signed)
Lavender tube sent to lab 

## 2023-03-25 NOTE — Inpatient Diabetes Management (Signed)
 Inpatient Diabetes Program Recommendations  AACE/ADA: New Consensus Statement on Inpatient Glycemic Control  Target Ranges:  Prepandial:   less than 140 mg/dL      Peak postprandial:   less than 180 mg/dL (1-2 hours)      Critically ill patients:  140 - 180 mg/dL    Latest Reference Range & Units 03/24/23 22:49 03/25/23 08:35  Glucose-Capillary 70 - 99 mg/dL 799 (H) 843 (H)    Latest Reference Range & Units 03/24/23 16:18 03/25/23 06:15  Glucose 70 - 99 mg/dL 750 (H) 853 (H)   Review of Glycemic Control  Diabetes history: DM1 Outpatient Diabetes medications: Levemir  10 units BID, Novolog  10-30 units TID with meals Current orders for Inpatient glycemic control: Semglee  8 units BID, Novolog  0-15 units TID with meals, Novolog  0-5 units QHS  Inpatient Diabetes Program Recommendations:    Insulin : Patient has Type 1 DM and is sensitive to insulin . Please decrease Novolog  correction to 0-9 units TID with meals. Once diet advanced and patient tolerating PO intake, may need to add meal coverage insulin .  NOTE: Patient admitted with gastroparesis with N/V, AKI on CKD, hypokalemia, and distal esophagitis. Patient has DM1 and per chart review patient sees Dr. Sam (Endocirnologist) and was last seen on 06/18/22. Per office note on 06/18/22 patient is prescribed Levemir  10 units BID, Novolin  R 1 unit with low carb meal, 4 units with high carb meal, plus correction of 130/40 (1 unit per 40 mg/dl above target glucose of 130 mg/dl).    Thanks, Earnie Gainer, RN, MSN, CDCES Diabetes Coordinator Inpatient Diabetes Program [phone] (Team Pager from 8am to 5pm)

## 2023-03-25 NOTE — Progress Notes (Signed)
 PROGRESS NOTE Kelli Hudson  FMW:[id number] DOB: 1990-05-13 DOA: 03/24/2023 PCP: Arloa Jarvis, NP  Brief Narrative/Hospital Course: 33 y.o. 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 In the ED vitals stable although tachycardic up to 131 initially, afebrile, not hypoxic, labs showed hypokalemia metabolic acidosis creatinine 1.4 hyperglycemia 249 normal LFTs mild leukocytosis 13.2 serum hCG negative. CT abdomen/pelvis with contrast>>small hiatal hernia with mild to moderate severity thickening of the visualized portion of the distal esophagus.Moderate severity diffuse colonic wall thickening and mild to moderate severity diffuse anasarca noted.  Patient was given IV fluids PPI antiemetics pain medication potassium supplementation and admission was requested.     Subjective: Patient seen and examined this morning Complains of nausea, afraid to try solid diet Asking for pain medication Overnight hemodynamically stable afebrile Labs shows worsening renal function potassium improved   Assessment and Plan: Principal Problem:   Nausea and vomiting Active Problems:   Hypokalemia   Diabetic gastroparesis (HCC)   Acute kidney injury superimposed on chronic kidney disease (HCC)   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)   Hypertension associated with diabetes (HCC)   Depression with anxiety   Chronic pain   Esophagitis  Gastroparesis with intractable nausea and vomiting Frequent admission for gastroparesis S/p gastric stimulator in place since 06/2022 at [facility] Distal esophagitis In CT 03/24/23: CT abdomen pelvis with contrast reviewed hiatal hernia thickening of distal  esophagus and diffuse colonic thickening and anasarca noticed. had an EGD when admitted at [facility] in [place] on 12/30/2022 which showed clean-based distal esophageal ulcers, retained fluid in the proximal stomach, and emetic injury in the gastric cardia.  Distal esophagus biopsy showed acute ulcerative esophagitis, no dysplasia or malignancy identified.  Stomach biopsy negative for H. pylori. Continue current management with IV fluids,  PPI, Carafate , scheduled Reglan10 mg q 8 hr, Compazine  prn, diet as tolerated.  AKI on CKD stage II-3A: Creatinine 1.46 on admission. Renal function fluctuates in setting of recurrent episodes of nausea/vomiting with GI losses. Continue IV fluid hydration and monitor renal function Recent Labs    02/27/23 0034 03/02/23 0134 03/02/23 0444 03/03/23 1136 03/04/23 0722 03/05/23 0359 03/07/23 0443 03/23/23 1244 03/24/23 1618 03/25/23 0615  BUN 23* 34* 34* 31* 25* 20 20 18  24* 23*  CREATININE 1.65* 2.63* 2.38* 1.95* 1.68* 1.48* 1.14* 0.99 1.46* 1.60*  CO2 20* 15* 15* 17* 17* 20* 27 23 21* 26  K 2.7* 3.7 3.5 3.8 4.0 4.0 4.3 3.7 3.0* 3.7     Type 1 diabetes with uncontrolled hyperglycemia: Last hemoglobin A1c 7.2% 03/02/2023.  Continue SSI and Semglee  8 u Recent Labs  Lab 03/24/23 2249 03/25/23 0835 03/25/23 1211  GLUCAP 200* 156* 156*    Hypokalemia: Resolved   Hypertension: BP stable, cont amlodipine  and carvedilol .   Chronic pain syndrome: Continue gabapentin .   Depression/anxiety/sleep: Continue Atarax  prn and trazodone .   S/p bilateral AKA  DVT prophylaxis: enoxaparin  (LOVENOX ) injection 40 mg Start: 03/24/23 2200 Code Status:   Code Status: Full Code Family Communication: plan of care discussed with patient at bedside. Patient status is: Remains hospitalized because of severity of illness Level of care: Telemetry   Dispo: The patient is from: Home            Anticipated disposition: TBD 1-2 days  Objective: Vitals last 24  hrs: Vitals:   03/25/23 1015 03/25/23 1130 03/25/23 1215 03/25/23 1232  BP: 99/65 104/67 120/86   Pulse: 93 90 100   Resp: 18 18    Temp:    98.2 F (36.8 C)  TempSrc:      SpO2: 96% 95% 99%    Weight change:   Physical Examination: General exam: alert awake,at baseline, older than stated age HEENT:Oral mucosa moist, Ear/Nose WNL grossly Respiratory system: Bilaterally clear BS,no use of accessory muscle Cardiovascular system: S1 & S2 +, No JVD. Gastrointestinal system: Abdomen soft,NT,ND, BS+ Nervous System: Alert, awake, moving all extremities,and following commands. Extremities: b/l AKA Skin: No rashes,no icterus. MSK: Normal muscle bulk,tone, power   Medications reviewed:  Scheduled Meds:  amLODipine   10 mg Oral Daily   carvedilol   6.25 mg Oral BID WC   enoxaparin  (LOVENOX ) injection  40 mg Subcutaneous Q24H   feeding supplement (GLUCERNA SHAKE)  237 mL Oral TID BM   gabapentin   300 mg Oral Q8H   insulin  aspart  0-9 Units Subcutaneous TID WC   insulin  glargine-yfgn  8 Units Subcutaneous BID   metoCLOPramide  (REGLAN ) injection  10 mg Intravenous Q8H   pantoprazole  (PROTONIX ) IV  40 mg Intravenous Q12H   sodium chloride  flush  3 mL Intravenous Q12H   sucralfate   1 g Oral TID WC   traZODone   150 mg Oral QHS   Continuous Infusions:   Diet Order             Diet clear liquid Fluid consistency: Thin  Diet effective now                  Intake/Output Summary (Last 24 hours) at 03/25/2023 1234 Last data filed at 03/25/2023 1057 Gross per 24 hour  Intake 1305.08 ml  Output 600 ml  Net 705.08 ml   Net IO Since Admission: 705.08 mL [03/25/23 1234]  Wt Readings from Last 3 Encounters:  03/23/23 62.6 kg  03/06/23 62.6 kg  02/23/23 65.3 kg     Unresulted Labs (From admission, onward)    None     Data Reviewed: I have personally reviewed following labs and imaging studies CBC: Recent Labs  Lab 03/23/23 1244 03/24/23 1618 03/25/23 0900  WBC 7.7 13.2*  12.0*  NEUTROABS  --  8.3*  --   HGB 10.9* 11.6* 9.1*  HCT 34.6* 36.5 30.0*  MCV 89.6 88.4 93.5  PLT 480* 567* 446*   Basic Metabolic Panel:  Recent Labs  Lab 03/23/23 1244 03/24/23 1618 03/25/23 0615  NA 140 143 140  K 3.7 3.0* 3.7  CL 108 106 107  CO2 23 21* 26  GLUCOSE 151* 249* 146*  BUN 18 24* 23*  CREATININE 0.99 1.46* 1.60*  CALCIUM  8.6* 8.6* 7.6*  MG  --  2.0  --    GFR: Estimated Creatinine Clearance: 50.3 mL/min (A) (by C-G formula based on SCr of 1.6 mg/dL (H)). Liver Function Tests:  Recent Labs  Lab 03/23/23 1244 03/24/23 1618  AST 40 27  ALT 44 31  ALKPHOS 128* 131*  BILITOT 0.5 1.0  PROT 5.5* 6.3*  ALBUMIN  1.6* 1.8*   Recent Labs  Lab 03/23/23 1244 03/24/23 1618  LIPASE 21 39   No results for input(s): AMMONIA in the last 168 hours. Recent Labs  Lab 03/24/23 2249 03/25/23 0835 03/25/23 1211  GLUCAP 200* 156* 156*  No results found for this or any previous visit (from the past 240 hours).  Antimicrobials/Microbiology: Anti-infectives (  From admission, onward)    None         Component Value Date/Time   SDES  03/02/2023 1828    BLOOD RIGHT HAND Performed at [facility], [address] [place], [place] [address]    SPECREQUEST  03/02/2023 1828    BOTTLES DRAWN AEROBIC ONLY Blood Culture results may not be optimal due to an inadequate volume of blood received in culture bottles Performed at [facility], [address], [place], [place] [address]    CULT (A) 03/02/2023 1828    ACTINOMYCES SPECIES Standardized susceptibility testing for this organism is not available. Performed at [facility], [address] [place], [place] [address]    REPTSTATUS 03/06/2023 FINAL 03/02/2023 1828     Radiology Studies: CT ABDOMEN PELVIS W CONTRAST Result Date: 03/24/2023 CLINICAL DATA:  Abdominal pain. EXAM: CT ABDOMEN AND PELVIS WITH CONTRAST TECHNIQUE: Multidetector CT imaging of the abdomen and pelvis was  performed using the standard protocol following bolus administration of intravenous contrast. RADIATION DOSE REDUCTION: This exam was performed according to the departmental dose-optimization program which includes automated exposure control, adjustment of the mA and/or kV according to patient size and/or use of iterative reconstruction technique. CONTRAST:  OMNIPAQUE  IOHEXOL  300 MG/ML  SOLN COMPARISON:  November 28, 2022 FINDINGS: Lower chest: No acute abnormality. Hepatobiliary: No focal liver abnormality is seen. No gallstones, gallbladder wall thickening, or biliary dilatation. Pancreas: Unremarkable. No pancreatic ductal dilatation or surrounding inflammatory changes. Spleen: Normal in size without focal abnormality. Adrenals/Urinary Tract: Adrenal glands are unremarkable. Kidneys are normal, without renal calculi, focal lesion, or hydronephrosis. The urinary bladder is markedly distended and is otherwise unremarkable. Stomach/Bowel: There is a small hiatal hernia with mild to moderate severity thickening of the visualized portion of the distal esophagus. Stable gastric stimulator implant positioning is seen. Appendix appears normal. No evidence of bowel dilatation. Moderate severity diffuse colonic wall thickening is noted. Poorly distended large bowel is also seen. Vascular/Lymphatic: No significant vascular findings are present. No enlarged abdominal or pelvic lymph nodes. Reproductive: Uterus and bilateral adnexa are unremarkable. Other: There is mild to moderate severity diffuse anasarca. No abdominopelvic ascites. Musculoskeletal: No acute or significant osseous findings. IMPRESSION: 1. Small hiatal hernia with mild to moderate severity thickening of the visualized portion of the distal esophagus. While this may represent sequelae associated with distal esophagitis, GI consultation is recommended. 2. Moderate severity diffuse colonic wall thickening which may represent sequelae associated with  colitis. 3. Mild to moderate severity diffuse anasarca. Electronically Signed   By: Suzen Dials M.D.   On: 03/24/2023 17:57     LOS: 0 days   Total time spent in review of labs and imaging, patient evaluation, formulation of plan, documentation and communication with family: 35 minutes  Mennie LAMY, MD  [facility]  03/25/2023, 12:34 PM

## 2023-03-26 DIAGNOSIS — R112 Nausea with vomiting, unspecified: Secondary | ICD-10-CM | POA: Diagnosis not present

## 2023-03-26 LAB — BASIC METABOLIC PANEL
Anion gap: 9 (ref 5–15)
BUN: 23 mg/dL — ABNORMAL HIGH (ref 6–20)
CO2: 21 mmol/L — ABNORMAL LOW (ref 22–32)
Calcium: 8 mg/dL — ABNORMAL LOW (ref 8.9–10.3)
Chloride: 104 mmol/L (ref 98–111)
Creatinine, Ser: 1.66 mg/dL — ABNORMAL HIGH (ref 0.44–1.00)
GFR, Estimated: 42 mL/min — ABNORMAL LOW (ref >60)
Glucose, Bld: 65 mg/dL — ABNORMAL LOW (ref 70–99)
Potassium: 3.6 mmol/L (ref 3.5–5.1)
Sodium: 134 mmol/L — ABNORMAL LOW (ref 135–145)

## 2023-03-26 LAB — GLUCOSE, CAPILLARY
Glucose-Capillary: 67 mg/dL — ABNORMAL LOW (ref 70–99)
Glucose-Capillary: 85 mg/dL (ref 70–99)
Glucose-Capillary: 130 mg/dL — ABNORMAL HIGH (ref 70–99)
Glucose-Capillary: 130 mg/dL — ABNORMAL HIGH (ref 70–99)
Glucose-Capillary: 141 mg/dL — ABNORMAL HIGH (ref 70–99)

## 2023-03-26 MED ORDER — INSULIN GLARGINE-YFGN 100 UNIT/ML ~~LOC~~ SOLN
5.0000 [IU] | Freq: Every day | SUBCUTANEOUS | Status: DC
  Administered 2023-03-26 – 2023-03-27 (×2): 5 [IU] via SUBCUTANEOUS
  Filled 2023-03-26 (×3): qty 0.05

## 2023-03-26 MED ORDER — PANTOPRAZOLE SODIUM 40 MG PO TBEC
40.0000 mg | DELAYED_RELEASE_TABLET | Freq: Two times a day (BID) | ORAL | Status: DC
  Administered 2023-03-26 – 2023-03-28 (×4): 40 mg via ORAL
  Filled 2023-03-26 (×4): qty 1

## 2023-03-26 NOTE — Inpatient Diabetes Management (Signed)
 Inpatient Diabetes Program Recommendations  AACE/ADA: New Consensus Statement on Inpatient Glycemic Control (2015)  Target Ranges:  Prepandial:   less than 140 mg/dL      Peak postprandial:   less than 180 mg/dL (1-2 hours)      Critically ill patients:  140 - 180 mg/dL   Lab Results  Component Value Date   GLUCAP 130 (H) 03/26/2023   HGBA1C 7.2 (H) 03/02/2023    Review of Glycemic Control  Diabetes history: DM1 Outpatient Diabetes medications: Levemir  10 BID, Novolog  10-30 TID with meals Current orders for Inpatient glycemic control: Novolog  0-9 units TID with meals  HgbA1C - 7.2% Noted hypoglycemia this am. Needs basal insulin  as pt is Type 1 DM  Inpatient Diabetes Program Recommendations:    Add Semglee  10 units at bedtime  If post-prandials > 180 mg/dL, add Novolog  3 units TID with meals if eating > 50%  Will continue to follow.  Thank you. Shona Brandy, RD, LDN, CDCES Inpatient Diabetes Coordinator [phone]

## 2023-03-26 NOTE — Progress Notes (Signed)
 PROGRESS NOTE Kelli Hudson  FMW:[id number] DOB: 09/18/1990 DOA: 03/24/2023 PCP: Arloa Jarvis, NP  Brief Narrative/Hospital Course: 33 y.o. 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 In the ED vitals stable although tachycardic up to 131 initially, afebrile, not hypoxic, labs showed hypokalemia metabolic acidosis creatinine 1.4 hyperglycemia 249 normal LFTs mild leukocytosis 13.2 serum hCG negative. CT abdomen/pelvis with contrast>>small hiatal hernia with mild to moderate severity thickening of the visualized portion of the distal esophagus.Moderate severity diffuse colonic wall thickening and mild to moderate severity diffuse anasarca noted.  Patient was given IV fluids PPI antiemetics pain medication potassium supplementation and admission was requested.     Subjective: Complains of diarrhea this morning overnight afebrile BP stable  Was hypoglycemic and 67 recheck improved to 85  Labs shows creatinine about the same 1.6  Still complains abdominal pain    Assessment and Plan: Principal Problem:   Nausea and vomiting Active Problems:   Hypokalemia   Diabetic gastroparesis (HCC)   Acute kidney injury superimposed on chronic kidney disease (HCC)   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)   Hypertension associated with diabetes (HCC)   Depression with anxiety   Chronic pain   Esophagitis  Gastroparesis with intractable nausea and vomiting Frequent admission for gastroparesis S/p gastric stimulator in place since 06/2022 at [facility] Distal esophagitis In CT 03/24/23 Diarrhea: CT abdomen pelvis with contrast reviewed hiatal hernia thickening of distal esophagus and diffuse  colonic thickening and anasarca noticed. had an EGD when admitted at [facility] in [place] on 12/30/2022 which showed clean-based distal esophageal ulcers, retained fluid in the proximal stomach, and emetic injury in the gastric cardia.  Distal esophagus biopsy showed acute ulcerative esophagitis, no dysplasia or malignancy identified.  Stomach biopsy negative for H. pylori. Continue current plan with IV fluid hydration PPI Carafate ,scheduled Reglan10 mg q 8 hr, Compazine  prn, diet as tolerated. Check GI pathogen panel for diarrhea  AKI on CKD stage II-3A: Creatinine 1.46 on admission. Renal function fluctuates in setting of recurrent episodes of nausea/vomiting with GI losses-AKI versus CKD creatinine has been ranging 0.9-2.6.  Continue IV fluid hydration Recent Labs    03/02/23 0134 03/02/23 0444 03/03/23 1136 03/04/23 0722 03/05/23 0359 03/07/23 0443 03/23/23 1244 03/24/23 1618 03/25/23 0615 03/26/23 0633  BUN 34* 34* 31* 25* 20 20 18  24* 23* 23*  CREATININE 2.63* 2.38* 1.95* 1.68* 1.48* 1.14* 0.99 1.46* 1.60* 1.66*  CO2 15* 15* 17* 17* 20* 27 23 21* 26 21*  K 3.7 3.5 3.8 4.0 4.0 4.3 3.7 3.0* 3.7 3.6     Type 1 diabetes with uncontrolled hyperglycemia and hypoglycemia: Last hemoglobin A1c 7.2% 03/02/2023.  With blood sugar in 67s continue long-acting insulin  continue SSI for now Recent Labs  Lab 03/25/23 1211 03/25/23 1744 03/25/23 2119 03/26/23 0724 03/26/23 0806  GLUCAP 156* 94 142* 67* 85    Hypokalemia: Resolved   Hypertension: BP is well-controlled on amlodipine  and carvedilol .   Chronic pain syndrome: Continue gabapentin .   Depression/anxiety/sleep: Continue Atarax  prn and trazodone .   S/p bilateral AKA  DVT prophylaxis: enoxaparin  (LOVENOX ) injection 40 mg Start: 03/24/23 2200 Code Status:   Code Status: Full Code Family Communication: plan of care discussed with patient at bedside. Patient status is: Remains hospitalized because of severity of  illness Level of care: Telemetry   Dispo: The patient is from: Home            Anticipated disposition: TBD 1 days Objective: Vitals last 24 hrs: Vitals:   03/25/23 1700 03/25/23 2114 03/26/23 0128 03/26/23 0534  BP: (!) 152/100 120/74 (!) 131/100 117/76  Pulse:  86 92 80  Resp:  16 16 16   Temp: 98 F (36.7 C) 98.1 F (36.7 C) (!) 97.1 F (36.2 C) 98.1 F (36.7 C)  TempSrc: Oral     SpO2: 100% 98% 98% 99%   Weight change:   Physical Examination: General exam: alert awake, oriented at baseline, older than stated age HEENT:Oral mucosa moist, Ear/Nose WNL grossly Respiratory system: Bilaterally clear BS,no use of accessory muscle Cardiovascular system: S1 & S2 +, No JVD. Gastrointestinal system: Abdomen soft,NT,ND, BS+ chronic abdominal tenderness with gastric stimulator in place Nervous System: Alert, awake, moving all extremities,and following commands. Extremities: b/l aka Skin: No rashes,no icterus. MSK: Normal muscle bulk,tone, power   Medications reviewed:  Scheduled Meds:  amLODipine   10 mg Oral Daily   carvedilol   6.25 mg Oral BID WC   enoxaparin  (LOVENOX ) injection  40 mg Subcutaneous Q24H   feeding supplement (GLUCERNA SHAKE)  237 mL Oral TID BM   gabapentin   300 mg Oral Q8H   insulin  aspart  0-9 Units Subcutaneous TID WC   metoCLOPramide  (REGLAN ) injection  10 mg Intravenous Q8H   pantoprazole  (PROTONIX ) IV  40 mg Intravenous Q12H   sodium chloride  flush  3 mL Intravenous Q12H   sucralfate   1 g Oral TID WC   traZODone   150 mg Oral QHS   Continuous Infusions:   Diet Order             DIET SOFT Room service appropriate? Yes; Fluid consistency: Thin  Diet effective 0500                 No intake or output data in the 24 hours ending 03/26/23 1101  Net IO Since Admission: 705.08 mL [03/26/23 1101]  Wt Readings from Last 3 Encounters:  03/23/23 62.6 kg  03/06/23 62.6 kg  02/23/23 65.3 kg     Unresulted Labs (From admission, onward)     Start      Ordered   03/26/23 0732  Gastrointestinal Panel by PCR , Stool  (Gastrointestinal Panel by PCR, Stool                                                                                                                                                     **Does Not include CLOSTRIDIUM DIFFICILE testing. **If CDIFF testing is needed, place order from the C Difficile Testing order set.**)  Once,   R        03/26/23 0732   03/26/23 0500  Basic metabolic panel  Daily,  R      03/25/23 1235          Data Reviewed: I have personally reviewed following labs and imaging studies CBC: Recent Labs  Lab 03/23/23 1244 03/24/23 1618 03/25/23 0900  WBC 7.7 13.2* 12.0*  NEUTROABS  --  8.3*  --   HGB 10.9* 11.6* 9.1*  HCT 34.6* 36.5 30.0*  MCV 89.6 88.4 93.5  PLT 480* 567* 446*   Basic Metabolic Panel:  Recent Labs  Lab 03/23/23 1244 03/24/23 1618 03/25/23 0615 03/26/23 0633  NA 140 143 140 134*  K 3.7 3.0* 3.7 3.6  CL 108 106 107 104  CO2 23 21* 26 21*  GLUCOSE 151* 249* 146* 65*  BUN 18 24* 23* 23*  CREATININE 0.99 1.46* 1.60* 1.66*  CALCIUM  8.6* 8.6* 7.6* 8.0*  MG  --  2.0  --   --    GFR: Estimated Creatinine Clearance: 48.5 mL/min (A) (by C-G formula based on SCr of 1.66 mg/dL (H)). Liver Function Tests:  Recent Labs  Lab 03/23/23 1244 03/24/23 1618  AST 40 27  ALT 44 31  ALKPHOS 128* 131*  BILITOT 0.5 1.0  PROT 5.5* 6.3*  ALBUMIN  1.6* 1.8*   Recent Labs  Lab 03/23/23 1244 03/24/23 1618  LIPASE 21 39   No results for input(s): AMMONIA in the last 168 hours. Recent Labs  Lab 03/25/23 1211 03/25/23 1744 03/25/23 2119 03/26/23 0724 03/26/23 0806  GLUCAP 156* 94 142* 67* 85  No results found for this or any previous visit (from the past 240 hours).  Antimicrobials/Microbiology: Anti-infectives (From admission, onward)    None         Component Value Date/Time   SDES  03/02/2023 1828    BLOOD RIGHT HAND Performed at [facility], [address], [place], [place] [address]    SPECREQUEST  03/02/2023 1828    BOTTLES DRAWN AEROBIC ONLY Blood Culture results may not be optimal due to an inadequate volume of blood received in culture bottles Performed at [facility], [address], [place], [place] [address]    CULT (A) 03/02/2023 1828    ACTINOMYCES SPECIES Standardized susceptibility testing for this organism is not available. Performed at [facility], [address] [place], [place] [address]    REPTSTATUS 03/06/2023 FINAL 03/02/2023 1828     Radiology Studies: CT ABDOMEN PELVIS W CONTRAST Result Date: 03/24/2023 CLINICAL DATA:  Abdominal pain. EXAM: CT ABDOMEN AND PELVIS WITH CONTRAST TECHNIQUE: Multidetector CT imaging of the abdomen and pelvis was performed using the standard protocol following bolus administration of intravenous contrast. RADIATION DOSE REDUCTION: This exam was performed according to the departmental dose-optimization program which includes automated exposure control, adjustment of the mA and/or kV according to patient size and/or use of iterative reconstruction technique. CONTRAST:  OMNIPAQUE  IOHEXOL  300 MG/ML  SOLN COMPARISON:  November 28, 2022 FINDINGS: Lower chest: No acute abnormality. Hepatobiliary: No focal liver abnormality is seen. No gallstones, gallbladder wall thickening, or biliary dilatation. Pancreas: Unremarkable. No pancreatic ductal dilatation or surrounding inflammatory changes. Spleen: Normal in size without focal abnormality. Adrenals/Urinary Tract: Adrenal glands are unremarkable. Kidneys are normal, without renal calculi, focal lesion, or hydronephrosis. The urinary bladder is markedly distended and is otherwise unremarkable. Stomach/Bowel: There is a small hiatal hernia with mild to moderate severity thickening of the visualized portion of the distal esophagus. Stable gastric stimulator implant positioning is seen. Appendix appears normal. No evidence of bowel  dilatation. Moderate severity  diffuse colonic wall thickening is noted. Poorly distended large bowel is also seen. Vascular/Lymphatic: No significant vascular findings are present. No enlarged abdominal or pelvic lymph nodes. Reproductive: Uterus and bilateral adnexa are unremarkable. Other: There is mild to moderate severity diffuse anasarca. No abdominopelvic ascites. Musculoskeletal: No acute or significant osseous findings. IMPRESSION: 1. Small hiatal hernia with mild to moderate severity thickening of the visualized portion of the distal esophagus. While this may represent sequelae associated with distal esophagitis, GI consultation is recommended. 2. Moderate severity diffuse colonic wall thickening which may represent sequelae associated with colitis. 3. Mild to moderate severity diffuse anasarca. Electronically Signed   By: Suzen Dials M.D.   On: 03/24/2023 17:57     LOS: 1 day   Total time spent in review of labs and imaging, patient evaluation, formulation of plan, documentation and communication with family: 35 minutes  Mennie LAMY, MD  [facility]  03/26/2023, 11:01 AM

## 2023-03-26 NOTE — Progress Notes (Signed)
 CBG 67-4oz orange juice given, will continue to monitor-See chart review/labs. Kelli Hudson

## 2023-03-26 NOTE — Plan of Care (Signed)
  Problem: Education: Goal: Ability to describe self-care measures that may prevent or decrease complications (Diabetes Survival Skills Education) will improve Outcome: Progressing   Problem: Coping: Goal: Ability to adjust to condition or change in health will improve Outcome: Progressing   Problem: Fluid Volume: Goal: Ability to maintain a balanced intake and output will improve Outcome: Progressing   Problem: Health Behavior/Discharge Planning: Goal: Ability to identify and utilize available resources and services will improve Outcome: Progressing   Problem: Nutritional: Goal: Maintenance of adequate nutrition will improve Outcome: Progressing   Problem: Skin Integrity: Goal: Risk for impaired skin integrity will decrease Outcome: Progressing   Problem: Clinical Measurements: Goal: Respiratory complications will improve Outcome: Progressing   Problem: Health Behavior/Discharge Planning: Goal: Ability to manage health-related needs will improve Outcome: Not Progressing   Problem: Metabolic: Goal: Ability to maintain appropriate glucose levels will improve Outcome: Not Progressing   Problem: Health Behavior/Discharge Planning: Goal: Ability to manage health-related needs will improve Outcome: Not Progressing   Problem: Metabolic: Goal: Ability to maintain appropriate glucose levels will improve Outcome: Not Progressing   Problem: Health Behavior/Discharge Planning: Goal: Ability to manage health-related needs will improve Outcome: Not Progressing   Problem: Metabolic: Goal: Ability to maintain appropriate glucose levels will improve Outcome: Not Progressing

## 2023-03-26 NOTE — Plan of Care (Signed)

## 2023-03-26 NOTE — TOC Initial Note (Addendum)
 Transition of Care [facility]) - Initial/Assessment Note    Patient Details  Name: Kelli Hudson MRN: [id number] Date of Birth: 10/14/1990  Transition of Care [facility] CM/SW Contact:    Hoy DELENA Bigness, LCSW Phone Number: 03/26/2023, 2:53 PM  Clinical Narrative:                 [place]. Pt w/ bilateral AKA and wheelchair bound at baseline. Pt does not have wheelchair with her and will need PTAR home at discharge.  Pt reports that she has a good system with taking her medications. Her mother picks her medications up and her mother and cousin sort medications by color based on time of day she is supposed to take them. She reports her mother has concerns with her being home alone due to her having syncopal episodes. She reports being approved for 100 hours of HH through her Medicaid and was set up with [facility] through [facility] however, reports it fell through and she never received services. CSW called [employer] and confirmed pt approved for 100 hrs of HH. It was reported that on their end [facility] accepted and they do not have updates as to why they have not provided services. CSW left VM with Care Coordination line in order to assist in having [facility] re-arranged for pt.  TOC will continue to follow.   ADDENDUM: Spoke with pt's mother who shared her frustrations with getting care for pt. She shares that when pt had her amputation she was sent home with no services and no DME. She ended up having to order DME online. She has been trying to get home health services for pt for several months. She reports that [facility] accepted Medicaid contract without having staff to provide services and with being out of service area pt lives in. [name] agreeable to have HH arranged with pt's Medicare while Medicaid seeks a new HHA.   Expected Discharge Plan: Home w Home Health Services Barriers to Discharge: Continued Medical Work up   Patient Goals and CMS Choice Patient states  their goals for this hospitalization and ongoing recovery are:: To return home          Expected Discharge Plan and Services In-house Referral: Clinical Social Work Discharge Planning Services: NA Post Acute Care Choice: Home Health Living arrangements for the past 2 months: Apartment                 DME Arranged: N/A DME Agency: NA                  Prior Living Arrangements/Services Living arrangements for the past 2 months: Apartment Lives with:: Self Patient language and need for interpreter reviewed:: Yes Do you feel safe going back to the place where you live?: Yes      Need for Family Participation in Patient Care: Yes (Comment) Care giver support system in place?: Yes (comment) Current home services: DME Furniture Conservator/restorer) Criminal Activity/Legal Involvement Pertinent to Current Situation/Hospitalization: No - Comment as needed  Activities of Daily Living      Permission Sought/Granted Permission sought to share information with : Facility Medical Sales Representative, Family Supports, Case Estate Manager/land Agent granted to share information with : Yes, Verbal Permission Granted     Permission granted to share info w AGENCY: HHA, Medicaid  Permission granted to share info w Relationship: Mother     Emotional Assessment Appearance:: Appears stated age Attitude/Demeanor/Rapport: Engaged Affect (typically observed): Accepting Orientation: : Oriented to Self, Oriented to  Place, Oriented to  Time, Oriented to Situation Alcohol / Substance Use: Not Applicable Psych Involvement: No (comment)  Admission diagnosis:  Nausea and vomiting [R11.2] Abdominal pain, unspecified abdominal location [R10.9] Patient Active Problem List   Diagnosis Date Noted   Esophagitis 03/24/2023   SIRS (systemic inflammatory response syndrome) (HCC) 03/02/2023   Fever 03/02/2023   Metabolic acidosis 03/02/2023   Hypertensive urgency 02/21/2023   Chronic pain 02/21/2023   Abscess of thigh  11/28/2022   C. 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   Hypokalemia 11/27/2022   Effusion, left knee 11/05/2022   MDD (major depressive disorder), recurrent episode, moderate (HCC) 10/16/2022   Necrotizing fasciitis of pelvic region and thigh (HCC) 10/08/2022   Necrotizing fasciitis of lower leg (HCC) 10/08/2022   Necrotizing fasciitis (HCC) 10/08/2022   Sepsis due to cellulitis (HCC) 10/03/2022   Cellulitis of lower extremity 10/03/2022   Multinodular goiter 06/18/2022   Malnutrition of moderate degree 04/18/2022   Intractable vomiting with nausea 04/17/2022   DKA (diabetic ketoacidosis) (HCC) 03/04/2022   Nausea vomiting and diarrhea 12/25/2021   Diabetic gastroparesis (HCC) 10/09/2021   Drug-seeking behavior 09/21/2021   Essential hypertension 08/25/2021   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) 08/25/2021   GERD without esophagitis 08/25/2021   Hematemesis    Prolonged Q-T interval on ECG    Nausea and vomiting 07/20/2021   Anemia of chronic disease 06/06/2021   Dehydration 06/04/2021   Hypomagnesemia 04/21/2021   Ulcerated, foot, left, with fat layer exposed (HCC) 04/20/2021   Uncontrolled type 1 diabetes mellitus with hyperglycemia, with long-term current use of insulin  (HCC) 12/18/2020   DM type 1 (diabetes mellitus, type 1) (HCC) 12/18/2020   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) 11/09/2020   Diabetic retinopathy of both eyes associated with type 1 diabetes mellitus (HCC) 09/24/2020   Diabetic gastroparesis associated with type 1 diabetes mellitus (HCC) 08/12/2020   Acute kidney injury superimposed on chronic kidney disease (HCC) 07/25/2020   Generalized abdominal pain 07/23/2020   GERD (gastroesophageal reflux disease) 07/23/2020   Diabetic nephropathy associated with type 1 diabetes mellitus (HCC) 06/27/2020   Sepsis (HCC) 05/27/2020   HLD (hyperlipidemia) 05/23/2020   Sinus  tachycardia 05/09/2020   Anemia 04/20/2020   Depression with anxiety 07/05/2019   Diabetic polyneuropathy associated with type 1 diabetes mellitus (HCC) 09/24/2017   Gastroparesis    Goiter 10/05/2009   DM2 (diabetes mellitus, type 2) (HCC) 10/05/2009   Hypertension associated with diabetes (HCC) 10/05/2009   PCP:  Arloa Jarvis, NP Pharmacy:   [id number] [place] - [address] [place] [address] Phone: [phone] Fax: [phone]  [id number] - [place], [place] - [address] [place] [address] Phone: [phone] Fax: [phone]  [place] [facility] [address] [place] [address] Phone: [phone] Fax: [phone]  [facility] [address] [place] [address] Phone: [phone] Fax: [phone]     Social Drivers of Health (SDOH) Social History: SDOH Screenings   Food Insecurity: No Food Insecurity (03/26/2023)  Recent Concern: Food Insecurity - Food Insecurity Present (12/29/2022)   Received from [facility]  Housing: High Risk (03/26/2023)  Transportation Needs: No Transportation Needs (03/26/2023)  Utilities: Not At Risk (03/26/2023)  Alcohol Screen: Low Risk  (05/29/2021)  Depression (PHQ2-9): Low Risk  (05/29/2021)  Recent Concern: Depression (PHQ2-9) -  Medium Risk (04/10/2021)  Financial Resource Strain: Low Risk  (02/13/2022)   Received from [facility]  Social Connections: Moderately Integrated (02/21/2023)  Stress: No Stress Concern Present (12/29/2022)   Received from [facility]  Tobacco Use: Low Risk  (03/23/2023)   SDOH Interventions: Housing Interventions: Inpatient TOC, Intervention Not Indicated   Readmission Risk Interventions    03/26/2023    1:56 PM 03/07/2023    1:30 PM 03/03/2023   11:39 AM  Readmission Risk Prevention Plan  Transportation  Screening Complete Complete Complete  Medication Review Oceanographer) Complete Complete Complete  PCP or Specialist appointment within 3-5 days of discharge Complete Complete   HRI or Home Care Consult Complete Not Complete Complete  SW Recovery Care/Counseling Consult Complete Complete Complete  Palliative Care Screening Not Applicable Not Complete Not Applicable  Skilled Nursing Facility Not Applicable Not Complete Not Applicable

## 2023-03-27 DIAGNOSIS — R112 Nausea with vomiting, unspecified: Secondary | ICD-10-CM | POA: Diagnosis not present

## 2023-03-27 LAB — GLUCOSE, CAPILLARY
Glucose-Capillary: 143 mg/dL — ABNORMAL HIGH (ref 70–99)
Glucose-Capillary: 119 mg/dL — ABNORMAL HIGH (ref 70–99)
Glucose-Capillary: 240 mg/dL — ABNORMAL HIGH (ref 70–99)
Glucose-Capillary: 98 mg/dL (ref 70–99)

## 2023-03-27 LAB — BASIC METABOLIC PANEL
Anion gap: 4 — ABNORMAL LOW (ref 5–15)
BUN: 27 mg/dL — ABNORMAL HIGH (ref 6–20)
CO2: 25 mmol/L (ref 22–32)
Calcium: 7.9 mg/dL — ABNORMAL LOW (ref 8.9–10.3)
Chloride: 108 mmol/L (ref 98–111)
Creatinine, Ser: 1.82 mg/dL — ABNORMAL HIGH (ref 0.44–1.00)
GFR, Estimated: 38 mL/min — ABNORMAL LOW (ref >60)
Glucose, Bld: 122 mg/dL — ABNORMAL HIGH (ref 70–99)
Potassium: 4.2 mmol/L (ref 3.5–5.1)
Sodium: 137 mmol/L (ref 135–145)

## 2023-03-27 MED ORDER — LACTATED RINGERS IV SOLN: INTRAVENOUS | Status: AC

## 2023-03-27 MED ORDER — SODIUM CHLORIDE 0.9 % IV BOLUS
1000.0000 mL | Freq: Once | INTRAVENOUS | Status: AC
  Administered 2023-03-27: 1000 mL via INTRAVENOUS

## 2023-03-27 NOTE — Plan of Care (Signed)

## 2023-03-27 NOTE — Progress Notes (Signed)
 PROGRESS NOTE Kelli Hudson  FMW:[id number] DOB: Sep 25, 1990 DOA: 03/24/2023 PCP: Arloa Jarvis, NP  Brief Narrative/Hospital Course: 33 y.o. 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 In the ED vitals stable although tachycardic up to 131 initially, afebrile, not hypoxic, labs showed hypokalemia metabolic acidosis creatinine 1.4 hyperglycemia 249 normal LFTs mild leukocytosis 13.2 serum hCG negative. CT abdomen/pelvis with contrast>>small hiatal hernia with mild to moderate severity thickening of the visualized portion of the distal esophagus.Moderate severity diffuse colonic wall thickening and mild to moderate severity diffuse anasarca noted.  Patient was given IV fluids PPI antiemetics pain medication potassium supplementation and admission was requested.    Subjective: Seen and examined this morning patient reports ongoing mild nausea abdominal discomfort  Does not feel comfortable going home yet  Given bolus fluids this morning  Assessment and Plan: Principal Problem:   Nausea and vomiting Active Problems:   Hypokalemia   Diabetic gastroparesis (HCC)   Acute kidney injury superimposed on chronic kidney disease (HCC)   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)   Hypertension associated with diabetes (HCC)   Depression with anxiety   Chronic pain   Esophagitis  Gastroparesis with intractable nausea and vomiting Frequent admission for gastroparesis S/p gastric stimulator in place since 06/2022 at [facility] Distal esophagitis In CT 03/24/23 Diarrhea: CT abdomen pelvis with contrast reviewed hiatal hernia thickening of distal esophagus and diffuse colonic thickening and  anasarca noticed. had an EGD when admitted at [facility] in [place] on 12/30/2022 which showed clean-based distal esophageal ulcers, retained fluid in the proximal stomach, and emetic injury in the gastric cardia.  Distal esophagus biopsy showed acute ulcerative esophagitis, no dysplasia or malignancy identified.  Stomach biopsy negative for H. pylori. At this time managing with PPI Carafate  Reglan  scheduled along with, Compazine  prn, tolerating diet but still with nausea, creatinine remains elevated  AKI on CKD stage II-3A: Creatinine 1.46 on admission. Renal function fluctuates in setting of recurrent episodes of nausea/vomiting with GI losses-AKI versus CKD creatinine further trending up given 1 L bolus and will continue NS at 100 cc/h overnight  Recent Labs    03/02/23 0444 03/03/23 1136 03/04/23 0722 03/05/23 0359 03/07/23 0443 03/23/23 1244 03/24/23 1618 03/25/23 0615 03/26/23 0633 03/27/23 0530  BUN 34* 31* 25* 20 20 18  24* 23* 23* 27*  CREATININE 2.38* 1.95* 1.68* 1.48* 1.14* 0.99 1.46* 1.60* 1.66* 1.82*  CO2 15* 17* 17* 20* 27 23 21* 26 21* 25  K 3.5 3.8 4.0 4.0 4.3 3.7 3.0* 3.7 3.6 4.2     Type 1 diabetes with uncontrolled hyperglycemia and hypoglycemia: Last hemoglobin A1c 7.2% 03/02/2023.  Hypoglycemia resolved, continue Semglee  5 units nightly and SSI.   Recent Labs  Lab 03/26/23 0806 03/26/23 1143 03/26/23 1639 03/26/23 2154 03/27/23 0815  GLUCAP 85 130* 130* 141* 143*    Hypokalemia: Resolved   Hypertension: BP is well-controlled on amlodipine  and carvedilol .   Chronic pain syndrome: Continue gabapentin .   Depression/anxiety/sleep: Continue Atarax  prn and trazodone .   S/p bilateral AKA  DVT prophylaxis: enoxaparin  (LOVENOX ) injection 40 mg Start: 03/24/23 2200 Code Status:   Code Status: Full Code Family Communication: plan of care discussed with patient at bedside. Patient status is: Remains hospitalized because of severity of illness Level of  care: Telemetry  Dispo: The patient is from: Home            Anticipated disposition: Anticipate discharge home 24 hours creatinine stable  Objective: Vitals last 24 hrs: Vitals:   03/26/23 0534 03/26/23 1319 03/26/23 2035 03/27/23 0537  BP: 117/76 (!) 127/90 101/63 129/71  Pulse: 80 94 88 78  Resp: 16 17 16 16   Temp: 98.1 F (36.7 C) 99.3 F (37.4 C) 98.3 F (36.8 C) 98.5 F (36.9 C)  TempSrc:   Oral   SpO2: 99% 100% 99% 98%   Weight change:   Physical Examination: General exam: alert awake, oriented at baseline, older than stated age HEENT:Oral mucosa moist, Ear/Nose WNL grossly Respiratory system: Bilaterally clear BS,no use of accessory muscle Cardiovascular system: S1 & S2 +, No JVD. Gastrointestinal system: Abdomen soft s/c gastric stimulator mildly tender,ND, BS+ Nervous System: Alert, awake, moving all extremities,and following commands. Extremities: LE AKA b/l Skin: No rashes,no icterus. MSK: Normal muscle bulk,tone, power   Medications reviewed:  Scheduled Meds:  amLODipine   10 mg Oral Daily   carvedilol   6.25 mg Oral BID WC   enoxaparin  (LOVENOX ) injection  40 mg Subcutaneous Q24H   feeding supplement (GLUCERNA SHAKE)  237 mL Oral TID BM   gabapentin   300 mg Oral Q8H   insulin  aspart  0-9 Units Subcutaneous TID WC   insulin  glargine-yfgn  5 Units Subcutaneous QHS   metoCLOPramide  (REGLAN ) injection  10 mg Intravenous Q8H   pantoprazole   40 mg Oral BID   sodium chloride  flush  3 mL Intravenous Q12H   sucralfate   1 g Oral TID WC   traZODone   150 mg Oral QHS   Continuous Infusions:   Diet Order             Diet regular Room service appropriate? Yes; Fluid consistency: Thin  Diet effective now                  Intake/Output Summary (Last 24 hours) at 03/27/2023 1100 Last data filed at 03/26/2023 1329 Gross per 24 hour  Intake 240 ml  Output --  Net 240 ml    Net IO Since Admission: 945.08 mL [03/27/23 1100]  Wt Readings from Last 3 Encounters:   03/23/23 62.6 kg  03/06/23 62.6 kg  02/23/23 65.3 kg     Unresulted Labs (From admission, onward)     Start     Ordered   03/26/23 0732  Gastrointestinal Panel by PCR , Stool  (Gastrointestinal Panel by PCR, Stool                                                                                                                                                     **Does Not include CLOSTRIDIUM DIFFICILE testing. **If CDIFF testing is needed, place order from the C Difficile Testing order set.**)  Once,  R        03/26/23 0732   03/26/23 0500  Basic metabolic panel  Daily,   R      03/25/23 1235          Data Reviewed: I have personally reviewed following labs and imaging studies CBC: Recent Labs  Lab 03/23/23 1244 03/24/23 1618 03/25/23 0900  WBC 7.7 13.2* 12.0*  NEUTROABS  --  8.3*  --   HGB 10.9* 11.6* 9.1*  HCT 34.6* 36.5 30.0*  MCV 89.6 88.4 93.5  PLT 480* 567* 446*   Basic Metabolic Panel:  Recent Labs  Lab 03/23/23 1244 03/24/23 1618 03/25/23 0615 03/26/23 0633 03/27/23 0530  NA 140 143 140 134* 137  K 3.7 3.0* 3.7 3.6 4.2  CL 108 106 107 104 108  CO2 23 21* 26 21* 25  GLUCOSE 151* 249* 146* 65* 122*  BUN 18 24* 23* 23* 27*  CREATININE 0.99 1.46* 1.60* 1.66* 1.82*  CALCIUM  8.6* 8.6* 7.6* 8.0* 7.9*  MG  --  2.0  --   --   --    GFR: Estimated Creatinine Clearance: 44.3 mL/min (A) (by C-G formula based on SCr of 1.82 mg/dL (H)). Liver Function Tests:  Recent Labs  Lab 03/23/23 1244 03/24/23 1618  AST 40 27  ALT 44 31  ALKPHOS 128* 131*  BILITOT 0.5 1.0  PROT 5.5* 6.3*  ALBUMIN  1.6* 1.8*   Recent Labs  Lab 03/23/23 1244 03/24/23 1618  LIPASE 21 39   No results for input(s): AMMONIA in the last 168 hours. Recent Labs  Lab 03/26/23 0806 03/26/23 1143 03/26/23 1639 03/26/23 2154 03/27/23 0815  GLUCAP 85 130* 130* 141* 143*  No results found for this or any previous visit (from the past 240 hours).   Antimicrobials/Microbiology: Anti-infectives (From admission, onward)    None         Component Value Date/Time   SDES  03/02/2023 1828    BLOOD RIGHT HAND Performed at [facility], [address], [place], [place] [address]    SPECREQUEST  03/02/2023 1828    BOTTLES DRAWN AEROBIC ONLY Blood Culture results may not be optimal due to an inadequate volume of blood received in culture bottles Performed at [facility], [address] [place], [place] [address]    CULT (A) 03/02/2023 1828    ACTINOMYCES SPECIES Standardized susceptibility testing for this organism is not available. Performed at [facility], [address] [place], [place] [address]    REPTSTATUS 03/06/2023 FINAL 03/02/2023 1828     Radiology Studies: No results found.    LOS: 2 days   Total time spent in review of labs and imaging, patient evaluation, formulation of plan, documentation and communication with family: 35 minutes  Mennie LAMY, MD  [facility]  03/27/2023, 11:00 AM

## 2023-03-27 NOTE — TOC CM/SW Note (Signed)

## 2023-03-27 NOTE — Plan of Care (Signed)
  Problem: Education: Goal: Ability to describe self-care measures that may prevent or decrease complications (Diabetes Survival Skills Education) will improve Outcome: Progressing   Problem: Coping: Goal: Ability to adjust to condition or change in health will improve Outcome: Progressing   Problem: Fluid Volume: Goal: Ability to maintain a balanced intake and output will improve Outcome: Progressing   Problem: Health Behavior/Discharge Planning: Goal: Ability to identify and utilize available resources and services will improve Outcome: Progressing Goal: Ability to manage health-related needs will improve Outcome: Progressing

## 2023-03-27 NOTE — TOC Progression Note (Addendum)
 Transition of Care [facility]) - Progression Note    Patient Details  Name: Kelli Hudson MRN: [id number] Date of Birth: [birth year]  [facility] CM/SW Contact  Hoy DELENA Bigness, LCSW Phone Number: 03/27/2023, 1:36 PM  Clinical Narrative:    Home health services for PT/OT/RN/Aide/SW has been arranged with [facility].  Referral sent to [facility] for PCS.  Information for both placed on pt's discharge instructions. Spoke with pt's mother and provided update.   ADDENDUM: received call from pt's [employer] RNCM, Therisa [phone]) who shares that this is the first she has heard that [facility] not providing home care services. She reports having another agency that could accept pt for services however, would need to speak to pt to confirm. Met with pt and assisted her in leaving a VM for her RNCM to confirm agency change.    Expected Discharge Plan: Home w Home Health Services Barriers to Discharge: Continued Medical Work up  Expected Discharge Plan and Services In-house Referral: Clinical Social Work Discharge Planning Services: NA Post Acute Care Choice: Home Health Living arrangements for the past 2 months: Apartment                 DME Arranged: N/A DME Agency: NA       HH Arranged: PT, OT, RN, Nurse's Aide, Social Work [facility] Agency: [facility] Date HH Agency Contacted: 03/27/23 Time HH Agency Contacted: 1336 Representative spoke with at [facility] Agency: Cindie   Social Determinants of Health (SDOH) Interventions SDOH Screenings   Food Insecurity: No Food Insecurity (03/26/2023)  Recent Concern: Food Insecurity - Food Insecurity Present (12/29/2022)   Received from [facility]  Housing: High Risk (03/26/2023)  Transportation Needs: No Transportation Needs (03/26/2023)  Utilities: Not At Risk (03/26/2023)  Alcohol Screen: Low Risk  (05/29/2021)  Depression (PHQ2-9): Low Risk  (05/29/2021)  Recent Concern: Depression (PHQ2-9) - Medium Risk  (04/10/2021)  Financial Resource Strain: Low Risk  (02/13/2022)   Received from [facility], [facility]  Social Connections: Moderately Integrated (02/21/2023)  Stress: No Stress Concern Present (12/29/2022)   Received from [facility]  Tobacco Use: Low Risk  (03/23/2023)    Readmission Risk Interventions    03/26/2023    1:56 PM 03/07/2023    1:30 PM 03/03/2023   11:39 AM  Readmission Risk Prevention Plan  Transportation Screening Complete Complete Complete  Medication Review Oceanographer) Complete Complete Complete  PCP or Specialist appointment within 3-5 days of discharge Complete Complete   HRI or Home Care Consult Complete Not Complete Complete  SW Recovery Care/Counseling Consult Complete Complete Complete  Palliative Care Screening Not Applicable Not Complete Not Applicable  Skilled Nursing Facility Not Applicable Not Complete Not Applicable

## 2023-03-28 DIAGNOSIS — R112 Nausea with vomiting, unspecified: Secondary | ICD-10-CM | POA: Diagnosis not present

## 2023-03-28 LAB — GASTROINTESTINAL PANEL BY PCR, STOOL (REPLACES STOOL CULTURE)

## 2023-03-28 LAB — BASIC METABOLIC PANEL
Anion gap: 5 (ref 5–15)
BUN: 24 mg/dL — ABNORMAL HIGH (ref 6–20)
CO2: 22 mmol/L (ref 22–32)
Calcium: 8.2 mg/dL — ABNORMAL LOW (ref 8.9–10.3)
Chloride: 112 mmol/L — ABNORMAL HIGH (ref 98–111)
Creatinine, Ser: 1.39 mg/dL — ABNORMAL HIGH (ref 0.44–1.00)
GFR, Estimated: 52 mL/min — ABNORMAL LOW (ref >60)
Glucose, Bld: 100 mg/dL — ABNORMAL HIGH (ref 70–99)
Potassium: 5 mmol/L (ref 3.5–5.1)
Sodium: 139 mmol/L (ref 135–145)

## 2023-03-28 LAB — GLUCOSE, CAPILLARY
Glucose-Capillary: 114 mg/dL — ABNORMAL HIGH (ref 70–99)
Glucose-Capillary: 99 mg/dL (ref 70–99)
Glucose-Capillary: 175 mg/dL — ABNORMAL HIGH (ref 70–99)

## 2023-03-28 NOTE — Discharge Summary (Signed)
 Physician Discharge Summary  Kelli Hudson FMW:[id number] DOB: [birth year] DOA: 03/24/2023  PCP: Arloa Jarvis, NP  Admit date: 03/24/2023 Discharge date: 03/28/2023 Recommendations for Outpatient Follow-up:  Follow up with PCP in 1 weeks-call for appointment Please obtain BMP/CBC in one week  Discharge Dispo: Home w/ [facility] Discharge Condition: Stable Code Status:   Code Status: Full Code Diet recommendation:  Diet Order             Diet regular Room service appropriate? Yes; Fluid consistency: Thin  Diet effective now                    Brief/Interim Summary: 33 y.o. 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 In the ED vitals stable although tachycardic up to 131 initially, afebrile, not hypoxic, labs showed hypokalemia metabolic acidosis creatinine 1.4 hyperglycemia 249 normal LFTs mild leukocytosis 13.2 serum hCG negative. CT abdomen/pelvis with contrast>>small hiatal hernia with mild to moderate severity thickening of the visualized portion of the distal esophagus.Moderate severity diffuse colonic wall thickening and mild to moderate severity diffuse anasarca noted.  Patient was given IV fluids PPI antiemetics pain medication potassium supplementation and admission was requested. She had elevated creatinine In the setting of nausea vomiting gastroparesis: Continued on IV fluid hydration. Plan is for discharge home once creatinine better and tolerating diet.    Discharge Diagnoses:  Principal Problem:   Nausea and vomiting Active Problems:   Hypokalemia   Diabetic gastroparesis (HCC)   Acute kidney injury superimposed on chronic kidney disease (HCC)   Diabetes mellitus type 1  with complications (HCC)   Hypertension associated with diabetes (HCC)   Depression with anxiety   Chronic pain   Esophagitis   Gastroparesis with intractable nausea and vomiting Frequent admission for gastroparesis S/p gastric stimulator in place since 06/2022 at [facility] Distal esophagitis In CT 03/24/23 Diarrhea: CT abdomen pelvis with contrast reviewed hiatal hernia thickening of distal esophagus and diffuse colonic thickening and anasarca noticed. had an EGD when admitted at [facility] in [place] on 12/30/2022 which showed clean-based distal esophageal ulcers, retained fluid in the proximal stomach, and emetic injury in the gastric cardia.  Distal esophagus biopsy showed acute ulcerative esophagitis, no dysplasia or malignancy identified.  Stomach biopsy negative for H. pylori.  Overall patient is clinically improved she will resume her home meds upon discharge and follow-up with her GI as outpatient.     AKI on CKD stage III-3A: Creatinine 1.46 on admission. Renal function fluctuates in setting of recurrent episodes of nausea/vomiting with GI losses-AKI versus CKD creatinine further trending up given 1 L bolus and kept overnight on normal saline hydration and AKI has improved to baseline 1.3  Recent Labs    03/03/23 1136 03/04/23 0722 03/05/23 0359 03/07/23 0443 03/23/23 1244 03/24/23 1618 03/25/23 0615 03/26/23 0633 03/27/23 0530 03/28/23 0721  BUN 31* 25* 20 20 18  24* 23* 23* 27* 24*  CREATININE 1.95* 1.68* 1.48* 1.14* 0.99 1.46* 1.60* 1.66* 1.82* 1.39*  CO2 17* 17* 20* 27 23 21* 26 21* 25 22  K 3.8 4.0 4.0 4.3 3.7 3.0* 3.7 3.6 4.2 5.0     Type 1 diabetes with uncontrolled hyperglycemia and hypoglycemia: Last hemoglobin A1c 7.2% 03/02/2023.  Hypoglycemia resolved, advised to continue home insulin  regimen suggested once  a day dosing instead of twice daily for long-acting   Hypokalemia: Resolved   Hypertension: BP is well-controlled on amlodipine  and carvedilol .    Chronic pain syndrome: Continue gabapentin .   Depression/anxiety/sleep: Continue Atarax  prn and trazodone .   S/p bilateral AKA  Consults: none Subjective: Eager to go home today alert awake oriented  Discharge Exam: Vitals:   03/27/23 1904 03/28/23 0440  BP: (!) 156/96 (!) 132/91  Pulse: 95 79  Resp: 18 18  Temp: 99.2 F (37.3 C) 98.1 F (36.7 C)  SpO2: 98% 98%   General: Pt is alert, awake, not in acute distress Cardiovascular: RRR, S1/S2 +, no rubs, no gallops Respiratory: CTA bilaterally, no wheezing, no rhonchi Abdominal: Soft, NT, ND, bowel sounds + Extremities: no edema, no cyanosis b/l AKA  Discharge Instructions  Discharge Instructions     Discharge instructions   Complete by: As directed    Please call call MD or return to ER for similar or worsening recurring problem that brought you to hospital or if any fever,nausea/vomiting,abdominal pain, uncontrolled pain, chest pain,  shortness of breath or any other alarming symptoms.  Please follow-up your doctor as instructed in a week time and call the office for appointment.  Please avoid alcohol, smoking, or any other illicit substance and maintain healthy habits including taking your regular medications as prescribed.  You were cared for by a hospitalist during your hospital stay. If you have any questions about your discharge medications or the care you received while you were in the hospital after you are discharged, you can call the unit and ask to speak with the hospitalist on call if the hospitalist that took care of you is not available.  Once you are discharged, your primary care physician will handle any further medical issues. Please note that NO REFILLS for any discharge medications will be authorized once you are discharged, as it is imperative that you return to your primary care physician (or establish a relationship with a primary care physician if you do not have one) for your aftercare needs so that  they can reassess your need for medications and monitor your lab values   Increase activity slowly   Complete by: As directed       Allergies as of 03/28/2023       Reactions   Ibuprofen  Other (See Comments)   07/23/22 - Worsening kidney function after 3 doses of ibuprofen  400mg .  Creatinine increased from 1.3 to 2.  Creatinine returned to normal after stopping.   Lisinopril  Cough, Other (See Comments), Itching, Rash   Breakout in rash        Medication List     TAKE these medications    acetaminophen  325 MG tablet Commonly known as: TYLENOL  Take 2 tablets (650 mg total) by mouth every 6 (six) hours as needed for mild pain, fever or headache.   amLODipine  10 MG tablet Commonly known as: NORVASC  Take 1 tablet (10 mg total) by mouth daily.   ascorbic acid  1000 MG tablet Commonly known as: VITAMIN C  Take 1 tablet (1,000 mg total) by mouth daily. What changed: when to take this   carvedilol  6.25 MG tablet Commonly known as: COREG  Take 1 tablet (6.25 mg total) by mouth 2 (two) times daily with a meal.   Dexcom G6 Transmitter Misc 1 Device by Does not apply route as directed.   famotidine  20 MG tablet Commonly known as: PEPCID  Take 1 tablet (20 mg total) by mouth 2 (two) times daily.  feeding supplement (GLUCERNA SHAKE) Liqd Take 237 mLs by mouth 3 (three) times daily between meals.   gabapentin  300 MG capsule Commonly known as: NEURONTIN  Take 1 capsule (300 mg total) by mouth every 8 (eight) hours.   hydrOXYzine  10 MG tablet Commonly known as: ATARAX  Take 1 tablet (10 mg total) by mouth 3 (three) times daily as needed for anxiety.   insulin  aspart 100 UNIT/ML injection Commonly known as: novoLOG  Inject 0-9 Units into the skin 3 (three) times daily with meals. What changed: how much to take   Insulin  Pen Needle 32G X 4 MM Misc 1 Device by Does not apply route in the morning, at noon, in the evening, and at bedtime.   MAGNESIUM  PO Take 1 tablet by mouth  daily.   methocarbamol  500 MG tablet Commonly known as: ROBAXIN  Take 500 mg by mouth 3 (three) times daily as needed for muscle spasms.   metoCLOPramide  10 MG tablet Commonly known as: REGLAN  Take 1 tablet (10 mg total) by mouth every 6 (six) hours.   multivitamin with minerals Tabs tablet Take 1 tablet by mouth daily.   Nexplanon  68 MG Impl implant Generic drug: etonogestrel  1 each by Subdermal route continuous.   oxyCODONE  5 MG immediate release tablet Commonly known as: Oxy IR/ROXICODONE  Take 1 tablet (5 mg total) by mouth every 6 (six) hours as needed for severe pain (pain score 7-10).   sucralfate  1 g tablet Commonly known as: CARAFATE  Take 1 g by mouth in the morning, at noon, and at bedtime.   traZODone  150 MG tablet Commonly known as: DESYREL  Take 150 mg by mouth at bedtime.        Follow-up Information     [facility] Follow up.   Specialty: Home Health Services Why: Hedda will follow up with you at discharge to provide home health services Contact information: [address] [place] [address] [phone]         [employer] [place] [employer]. Call.   Why: Please call you [employer] [facility] RN CM for assistance with personal care services and any issues with agency providing this service Contact information: RN Case Manager- Therisa: [phone]               Allergies  Allergen Reactions   Ibuprofen  Other (See Comments)    07/23/22 - Worsening kidney function after 3 doses of ibuprofen  400mg .  Creatinine increased from 1.3 to 2.  Creatinine returned to normal after stopping.   Lisinopril  Cough, Other (See Comments), Itching and Rash    Breakout in rash    The results of significant diagnostics from this hospitalization (including imaging, microbiology, ancillary and laboratory) are listed below for reference.    Microbiology: Recent Results (from the past 240 hours)  Gastrointestinal Panel by PCR , Stool     Status:  None   Collection Time: 03/27/23  9:23 PM   Specimen: Stool  Result Value Ref Range Status   Campylobacter species NOT DETECTED NOT DETECTED Final   Plesimonas shigelloides NOT DETECTED NOT DETECTED Final   Salmonella species NOT DETECTED NOT DETECTED Final   Yersinia enterocolitica NOT DETECTED NOT DETECTED Final   Vibrio species NOT DETECTED NOT DETECTED Final   Vibrio cholerae NOT DETECTED NOT DETECTED Final   Enteroaggregative E coli (EAEC) NOT DETECTED NOT DETECTED Final   Enteropathogenic E coli (EPEC) NOT DETECTED NOT DETECTED Final   Enterotoxigenic E coli (ETEC) NOT DETECTED NOT DETECTED Final   Shiga like toxin producing  E coli (STEC) NOT DETECTED NOT DETECTED Final   Shigella/Enteroinvasive E coli (EIEC) NOT DETECTED NOT DETECTED Final   Cryptosporidium NOT DETECTED NOT DETECTED Final   Cyclospora cayetanensis NOT DETECTED NOT DETECTED Final   Entamoeba histolytica NOT DETECTED NOT DETECTED Final   Giardia lamblia NOT DETECTED NOT DETECTED Final   Adenovirus F40/41 NOT DETECTED NOT DETECTED Final   Astrovirus NOT DETECTED NOT DETECTED Final   Norovirus GI/GII NOT DETECTED NOT DETECTED Final   Rotavirus A NOT DETECTED NOT DETECTED Final   Sapovirus (I, II, IV, and V) NOT DETECTED NOT DETECTED Final    Comment: Performed at [facility], [address], [place], [place] [address]    Procedures/Studies: CT ABDOMEN PELVIS W CONTRAST Result Date: 03/24/2023 CLINICAL DATA:  Abdominal pain. EXAM: CT ABDOMEN AND PELVIS WITH CONTRAST TECHNIQUE: Multidetector CT imaging of the abdomen and pelvis was performed using the standard protocol following bolus administration of intravenous contrast. RADIATION DOSE REDUCTION: This exam was performed according to the departmental dose-optimization program which includes automated exposure control, adjustment of the mA and/or kV according to patient size and/or use of iterative reconstruction technique. CONTRAST:  OMNIPAQUE  IOHEXOL   300 MG/ML  SOLN COMPARISON:  November 28, 2022 FINDINGS: Lower chest: No acute abnormality. Hepatobiliary: No focal liver abnormality is seen. No gallstones, gallbladder wall thickening, or biliary dilatation. Pancreas: Unremarkable. No pancreatic ductal dilatation or surrounding inflammatory changes. Spleen: Normal in size without focal abnormality. Adrenals/Urinary Tract: Adrenal glands are unremarkable. Kidneys are normal, without renal calculi, focal lesion, or hydronephrosis. The urinary bladder is markedly distended and is otherwise unremarkable. Stomach/Bowel: There is a small hiatal hernia with mild to moderate severity thickening of the visualized portion of the distal esophagus. Stable gastric stimulator implant positioning is seen. Appendix appears normal. No evidence of bowel dilatation. Moderate severity diffuse colonic wall thickening is noted. Poorly distended large bowel is also seen. Vascular/Lymphatic: No significant vascular findings are present. No enlarged abdominal or pelvic lymph nodes. Reproductive: Uterus and bilateral adnexa are unremarkable. Other: There is mild to moderate severity diffuse anasarca. No abdominopelvic ascites. Musculoskeletal: No acute or significant osseous findings. IMPRESSION: 1. Small hiatal hernia with mild to moderate severity thickening of the visualized portion of the distal esophagus. While this may represent sequelae associated with distal esophagitis, GI consultation is recommended. 2. Moderate severity diffuse colonic wall thickening which may represent sequelae associated with colitis. 3. Mild to moderate severity diffuse anasarca. Electronically Signed   By: Suzen Dials M.D.   On: 03/24/2023 17:57   DG Abd Portable 1V Result Date: 03/06/2023 CLINICAL DATA:  Nausea and vomiting. EXAM: PORTABLE ABDOMEN - 1 VIEW COMPARISON:  02/21/2023 FINDINGS: Normal bowel gas pattern. Stable left mid abdominal gastric stimulator generator and leads. Unremarkable  bones. Stable left pelvic phlebolith. IMPRESSION: No acute abnormality. Electronically Signed   By: Elspeth Bathe M.D.   On: 03/06/2023 15:09   DG Chest Port 1 View Result Date: 03/02/2023 CLINICAL DATA:  [id number].  Fever.  Near syncopal episode. EXAM: PORTABLE CHEST 1 VIEW COMPARISON:  PA chest 02/21/2023. FINDINGS: The heart size and mediastinal contours are within normal limits. Both lungs are clear. The visualized skeletal structures are unremarkable. IMPRESSION: No active disease. Electronically Signed   By: Francis Quam M.D.   On: 03/02/2023 04:22    Labs: BNP (last 3 results) Recent Labs    11/30/22 0754 12/01/22 0736 12/02/22 0312  BNP 1,098.0* 1,207.2* 1,252.9*   Basic Metabolic Panel: Recent Labs  Lab 03/24/23  1618 03/25/23 0615 03/26/23 0633 03/27/23 0530 03/28/23 0721  NA 143 140 134* 137 139  K 3.0* 3.7 3.6 4.2 5.0  CL 106 107 104 108 112*  CO2 21* 26 21* 25 22  GLUCOSE 249* 146* 65* 122* 100*  BUN 24* 23* 23* 27* 24*  CREATININE 1.46* 1.60* 1.66* 1.82* 1.39*  CALCIUM  8.6* 7.6* 8.0* 7.9* 8.2*  MG 2.0  --   --   --   --    Liver Function Tests: Recent Labs  Lab 03/23/23 1244 03/24/23 1618  AST 40 27  ALT 44 31  ALKPHOS 128* 131*  BILITOT 0.5 1.0  PROT 5.5* 6.3*  ALBUMIN  1.6* 1.8*   Recent Labs  Lab 03/23/23 1244 03/24/23 1618  LIPASE 21 39   No results for input(s): AMMONIA in the last 168 hours. CBC: Recent Labs  Lab 03/23/23 1244 03/24/23 1618 03/25/23 0900  WBC 7.7 13.2* 12.0*  NEUTROABS  --  8.3*  --   HGB 10.9* 11.6* 9.1*  HCT 34.6* 36.5 30.0*  MCV 89.6 88.4 93.5  PLT 480* 567* 446*   Cardiac Enzymes: No results for input(s): CKTOTAL, CKMB, CKMBINDEX, TROPONINI in the last 168 hours. BNP: Invalid input(s): POCBNP CBG: Recent Labs  Lab 03/27/23 1627 03/27/23 2030 03/28/23 0334 03/28/23 0749 03/28/23 1201  GLUCAP 240* 98 114* 99 175*   D-Dimer No results for input(s): DDIMER in the last 72 hours. Hgb A1c No  results for input(s): HGBA1C in the last 72 hours. Lipid Profile No results for input(s): CHOL, HDL, LDLCALC, TRIG, CHOLHDL, LDLDIRECT in the last 72 hours. Thyroid function studies No results for input(s): TSH, T4TOTAL, T3FREE, THYROIDAB in the last 72 hours.  Invalid input(s): FREET3 Anemia work up No results for input(s): VITAMINB12, FOLATE, FERRITIN, TIBC, IRON , RETICCTPCT in the last 72 hours. Urinalysis    Component Value Date/Time   COLORURINE YELLOW 03/23/2023 1525   APPEARANCEUR HAZY (A) 03/23/2023 1525   LABSPEC 1.017 03/23/2023 1525   PHURINE 5.0 03/23/2023 1525   GLUCOSEU 150 (A) 03/23/2023 1525   GLUCOSEU NEGATIVE 05/22/2020 1417   HGBUR NEGATIVE 03/23/2023 1525   BILIRUBINUR NEGATIVE 03/23/2023 1525   KETONESUR NEGATIVE 03/23/2023 1525   PROTEINUR >=300 (A) 03/23/2023 1525   UROBILINOGEN 0.2 05/22/2020 1417   NITRITE NEGATIVE 03/23/2023 1525   LEUKOCYTESUR NEGATIVE 03/23/2023 1525   Sepsis Labs Recent Labs  Lab 03/23/23 1244 03/24/23 1618 03/25/23 0900  WBC 7.7 13.2* 12.0*   Microbiology Recent Results (from the past 240 hours)  Gastrointestinal Panel by PCR , Stool     Status: None   Collection Time: 03/27/23  9:23 PM   Specimen: Stool  Result Value Ref Range Status   Campylobacter species NOT DETECTED NOT DETECTED Final   Plesimonas shigelloides NOT DETECTED NOT DETECTED Final   Salmonella species NOT DETECTED NOT DETECTED Final   Yersinia enterocolitica NOT DETECTED NOT DETECTED Final   Vibrio species NOT DETECTED NOT DETECTED Final   Vibrio cholerae NOT DETECTED NOT DETECTED Final   Enteroaggregative E coli (EAEC) NOT DETECTED NOT DETECTED Final   Enteropathogenic E coli (EPEC) NOT DETECTED NOT DETECTED Final   Enterotoxigenic E coli (ETEC) NOT DETECTED NOT DETECTED Final   Shiga like toxin producing E coli (STEC) NOT DETECTED NOT DETECTED Final   Shigella/Enteroinvasive E coli (EIEC) NOT DETECTED NOT DETECTED  Final   Cryptosporidium NOT DETECTED NOT DETECTED Final   Cyclospora cayetanensis NOT DETECTED NOT DETECTED Final   Entamoeba histolytica NOT DETECTED NOT DETECTED Final  Giardia lamblia NOT DETECTED NOT DETECTED Final   Adenovirus F40/41 NOT DETECTED NOT DETECTED Final   Astrovirus NOT DETECTED NOT DETECTED Final   Norovirus GI/GII NOT DETECTED NOT DETECTED Final   Rotavirus A NOT DETECTED NOT DETECTED Final   Sapovirus (I, II, IV, and V) NOT DETECTED NOT DETECTED Final    Comment: Performed at [facility], [address], [place], [place] [address]   Time coordinating discharge: 25 minutes  SIGNED: Mennie LAMY, MD  [facility] 03/28/2023, 12:20 PM  If 7PM-7AM, please contact night-coverage [email or web address]

## 2023-03-28 NOTE — Plan of Care (Signed)

## 2023-03-28 NOTE — TOC Transition Note (Signed)
 Transition of Care [facility]) - Discharge Note   Patient Details  Name: Kelli Hudson MRN: [id number] Date of Birth: 1990/10/10  Transition of Care [facility]) CM/SW Contact:  Sonda Manuella Quill, RN Phone Number: 03/28/2023, 1:06 PM   Clinical Narrative:    D/C orders received; notified pt needs transportation home; spoke w/ pt in room; she gave d/c address: [address], [place], [place] [address]; PTAR ca;;ed at 1305; spoke w/ Operator # [phone]; [facility] services previously arranged w/ Hedda; Cory at agency notified; no TOC needs.  Final next level of care: Home w Home Health Services Barriers to Discharge: No Barriers Identified   Patient Goals and CMS Choice Patient states their goals for this hospitalization and ongoing recovery are:: To return home          Discharge Placement                       Discharge Plan and Services Additional resources added to the After Visit Summary for   In-house Referral: Clinical Social Work Discharge Planning Services: NA Post Acute Care Choice: Home Health          DME Arranged: N/A DME Agency: NA       HH Arranged: PT, OT, RN, Nurse's Aide, Social Work [facility] Agency: [facility] Date [facility] Agency Contacted: 03/27/23 Time HH Agency Contacted: 1336 Representative spoke with at [facility] Agency: Cindie  Social Drivers of Health (SDOH) Interventions SDOH Screenings   Food Insecurity: No Food Insecurity (03/26/2023)  Recent Concern: Food Insecurity - Food Insecurity Present (12/29/2022)   Received from [facility]  Housing: High Risk (03/26/2023)  Transportation Needs: No Transportation Needs (03/26/2023)  Utilities: Not At Risk (03/26/2023)  Alcohol Screen: Low Risk  (05/29/2021)  Depression (PHQ2-9): Low Risk  (05/29/2021)  Recent Concern: Depression (PHQ2-9) - Medium Risk (04/10/2021)  Financial Resource Strain: Low Risk  (02/13/2022)   Received from [facility], [facility]  Social Connections: Moderately  Integrated (02/21/2023)  Stress: No Stress Concern Present (12/29/2022)   Received from [facility]  Tobacco Use: Low Risk  (03/23/2023)     Readmission Risk Interventions    03/26/2023    1:56 PM 03/07/2023    1:30 PM 03/03/2023   11:39 AM  Readmission Risk Prevention Plan  Transportation Screening Complete Complete Complete  Medication Review Ocean[employer]) Complete Complete Complete  PCP or Specialist appointment within 3-5 days of discharge Complete Complete   HRI or Home Care Consult Complete Not Complete Complete  SW Recovery Care/Counseling Consult Complete Complete Complete  Palliative Care Screening Not Applicable Not Complete Not Applicable  Skilled Nursing Facility Not Applicable Not Complete Not Applicable

## 2023-04-04 ENCOUNTER — Other Ambulatory Visit: Payer: Self-pay

## 2023-04-04 ENCOUNTER — Emergency Department (HOSPITAL_COMMUNITY)
Admission: EM | Admit: 2023-04-04 | Discharge: 2023-04-05 | Disposition: A | Discharge status: Home / Self Care | Payer: 59 | Source: Home / Self Care | Attending: Emergency Medicine | Admitting: Emergency Medicine

## 2023-04-04 ENCOUNTER — Encounter (HOSPITAL_COMMUNITY): Payer: Self-pay | Admitting: Emergency Medicine

## 2023-04-04 DIAGNOSIS — R739 Hyperglycemia, unspecified: Secondary | ICD-10-CM

## 2023-04-04 DIAGNOSIS — E039 Hypothyroidism, unspecified: Secondary | ICD-10-CM | POA: Insufficient documentation

## 2023-04-04 DIAGNOSIS — E1165 Type 2 diabetes mellitus with hyperglycemia: Secondary | ICD-10-CM | POA: Insufficient documentation

## 2023-04-04 DIAGNOSIS — E1143 Type 2 diabetes mellitus with diabetic autonomic (poly)neuropathy: Secondary | ICD-10-CM | POA: Insufficient documentation

## 2023-04-04 DIAGNOSIS — R1013 Epigastric pain: Secondary | ICD-10-CM

## 2023-04-04 DIAGNOSIS — E1110 Type 2 diabetes mellitus with ketoacidosis without coma: Secondary | ICD-10-CM | POA: Insufficient documentation

## 2023-04-04 DIAGNOSIS — Z20822 Contact with and (suspected) exposure to covid-19: Secondary | ICD-10-CM | POA: Insufficient documentation

## 2023-04-04 DIAGNOSIS — K859 Acute pancreatitis without necrosis or infection, unspecified: Secondary | ICD-10-CM | POA: Diagnosis not present

## 2023-04-04 DIAGNOSIS — Z79899 Other long term (current) drug therapy: Secondary | ICD-10-CM | POA: Insufficient documentation

## 2023-04-04 DIAGNOSIS — I10 Essential (primary) hypertension: Secondary | ICD-10-CM | POA: Insufficient documentation

## 2023-04-04 DIAGNOSIS — Z794 Long term (current) use of insulin: Secondary | ICD-10-CM | POA: Insufficient documentation

## 2023-04-04 DIAGNOSIS — J101 Influenza due to other identified influenza virus with other respiratory manifestations: Secondary | ICD-10-CM

## 2023-04-04 DIAGNOSIS — R112 Nausea with vomiting, unspecified: Secondary | ICD-10-CM

## 2023-04-04 LAB — RESP PANEL BY RT-PCR (RSV, FLU A&B, COVID)  RVPGX2
Influenza A by PCR: POSITIVE — AB
Influenza B by PCR: NEGATIVE
Resp Syncytial Virus by PCR: NEGATIVE
SARS Coronavirus 2 by RT PCR: NEGATIVE

## 2023-04-04 LAB — CBC
HCT: 41.3 % (ref 36.0–46.0)
Hemoglobin: 13 g/dL (ref 12.0–15.0)
MCH: 28.6 pg (ref 26.0–34.0)
MCHC: 31.5 g/dL (ref 30.0–36.0)
MCV: 90.8 fL (ref 80.0–100.0)
Platelets: 541 10*3/uL — ABNORMAL HIGH (ref 150–400)
RBC: 4.55 MIL/uL (ref 3.87–5.11)
RDW: 14.9 % (ref 11.5–15.5)
WBC: 8.7 10*3/uL (ref 4.0–10.5)
nRBC: 0 % (ref 0.0–0.2)

## 2023-04-04 LAB — COMPREHENSIVE METABOLIC PANEL
ALT: 26 U/L (ref 0–44)
AST: 22 U/L (ref 15–41)
Albumin: 1.6 g/dL — ABNORMAL LOW (ref 3.5–5.0)
Alkaline Phosphatase: 146 U/L — ABNORMAL HIGH (ref 38–126)
Anion gap: 13 (ref 5–15)
BUN: 20 mg/dL (ref 6–20)
CO2: 19 mmol/L — ABNORMAL LOW (ref 22–32)
Calcium: 8.6 mg/dL — ABNORMAL LOW (ref 8.9–10.3)
Chloride: 105 mmol/L (ref 98–111)
Creatinine, Ser: 1.68 mg/dL — ABNORMAL HIGH (ref 0.44–1.00)
GFR, Estimated: 41 mL/min — ABNORMAL LOW (ref >60)
Glucose, Bld: 405 mg/dL — ABNORMAL HIGH (ref 70–99)
Potassium: 3.6 mmol/L (ref 3.5–5.1)
Sodium: 137 mmol/L (ref 135–145)
Total Bilirubin: 0.8 mg/dL (ref 0.0–1.2)
Total Protein: 6.2 g/dL — ABNORMAL LOW (ref 6.5–8.1)

## 2023-04-04 LAB — CBG MONITORING, ED
Glucose-Capillary: 373 mg/dL — ABNORMAL HIGH (ref 70–99)
Glucose-Capillary: 242 mg/dL — ABNORMAL HIGH (ref 70–99)

## 2023-04-04 LAB — LIPASE, BLOOD: Lipase: 30 U/L (ref 11–51)

## 2023-04-04 LAB — MAGNESIUM: Magnesium: 2.1 mg/dL (ref 1.7–2.4)

## 2023-04-04 LAB — HCG, SERUM, QUALITATIVE: Preg, Serum: NEGATIVE

## 2023-04-04 MED ORDER — BENZONATATE 100 MG PO CAPS: 100.0000 mg | ORAL_CAPSULE | Freq: Three times a day (TID) | ORAL | 0 refills | Status: AC

## 2023-04-04 MED ORDER — INSULIN ASPART 100 UNIT/ML IJ SOLN
8.0000 [IU] | Freq: Once | INTRAMUSCULAR | Status: AC
  Administered 2023-04-04: 8 [IU] via SUBCUTANEOUS
  Filled 2023-04-04: qty 0.08

## 2023-04-04 MED ORDER — LORAZEPAM 2 MG/ML IJ SOLN
1.0000 mg | Freq: Once | INTRAMUSCULAR | Status: AC
  Administered 2023-04-04: 1 mg via INTRAVENOUS
  Filled 2023-04-04: qty 1

## 2023-04-04 MED ORDER — MORPHINE SULFATE (PF) 4 MG/ML IV SOLN
4.0000 mg | Freq: Once | INTRAVENOUS | Status: AC
  Administered 2023-04-04: 4 mg via INTRAVENOUS
  Filled 2023-04-04: qty 1

## 2023-04-04 MED ORDER — DROPERIDOL 2.5 MG/ML IJ SOLN
1.2500 mg | Freq: Once | INTRAMUSCULAR | Status: AC
  Administered 2023-04-04: 1.25 mg via INTRAVENOUS
  Filled 2023-04-04: qty 2

## 2023-04-04 MED ORDER — SODIUM CHLORIDE 0.9 % IV BOLUS
1000.0000 mL | Freq: Once | INTRAVENOUS | Status: AC
  Administered 2023-04-04: 1000 mL via INTRAVENOUS

## 2023-04-04 MED ORDER — ACETAMINOPHEN 500 MG PO TABS: 500.0000 mg | ORAL_TABLET | Freq: Four times a day (QID) | ORAL | 0 refills | Status: AC | PRN

## 2023-04-04 MED ORDER — HYDROMORPHONE HCL 1 MG/ML IJ SOLN
1.0000 mg | Freq: Once | INTRAMUSCULAR | Status: DC
  Filled 2023-04-04: qty 1

## 2023-04-04 MED ORDER — POTASSIUM CHLORIDE CRYS ER 20 MEQ PO TBCR
40.0000 meq | EXTENDED_RELEASE_TABLET | Freq: Once | ORAL | Status: AC
  Administered 2023-04-04: 40 meq via ORAL
  Filled 2023-04-04: qty 4

## 2023-04-04 MED ORDER — ACETAMINOPHEN 500 MG PO TABS
1000.0000 mg | ORAL_TABLET | Freq: Once | ORAL | Status: AC
  Administered 2023-04-04: 1000 mg via ORAL
  Filled 2023-04-04: qty 2

## 2023-04-04 MED ORDER — METOCLOPRAMIDE HCL 5 MG/ML IJ SOLN
10.0000 mg | Freq: Once | INTRAMUSCULAR | Status: AC
  Administered 2023-04-04: 10 mg via INTRAVENOUS
  Filled 2023-04-04: qty 2

## 2023-04-04 MED ORDER — ACETAMINOPHEN 500 MG PO TABS
ORAL_TABLET | ORAL | Status: AC
  Administered 2023-04-04: 1000 mg via ORAL
  Filled 2023-04-04: qty 1

## 2023-04-04 MED ORDER — BENZONATATE 100 MG PO CAPS
100.0000 mg | ORAL_CAPSULE | Freq: Once | ORAL | Status: AC
  Administered 2023-04-04: 100 mg via ORAL
  Filled 2023-04-04: qty 1

## 2023-04-04 MED ORDER — OSELTAMIVIR PHOSPHATE 75 MG PO CAPS: 75.0000 mg | ORAL_CAPSULE | Freq: Two times a day (BID) | ORAL | 0 refills | Status: DC

## 2023-04-04 NOTE — Discharge Instructions (Signed)
You have been evaluated for your symptoms.  You have tested positive for influenza A.  Please take Tamiflu as treatment and take Tylenol for fever or bodyaches.  Take Tessalon for cough.

## 2023-04-04 NOTE — ED Provider Notes (Signed)
[place] EMERGENCY DEPARTMENT AT [facility] Provider Note   CSN: [id number] Arrival date & time: 04/04/23  1649     History  Chief Complaint  Patient presents with   Abdominal Pain   Emesis   Nausea    Kelli Hudson is a 33 y.o. female.   Abdominal Pain Associated symptoms: vomiting   Emesis Associated symptoms: abdominal pain     33 year old female presents emergency department with complaints of abdominal pain, nausea, vomiting.  Patient reports symptoms beginning this morning around 10 AM.  States that she was coughing before vomiting began.  Reports history of gastroparesis and states this feels the exact same.  Was recently admitted and discharged 7 days ago for similar presentation.  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  Patient states she tried to take her at home Reglan without significant improvement.  States she is vomited 10+ times since symptom onset.  Repeatedly asking for Dilaudid on initial interview states that is the only thing that works.  Denies any urinary symptoms, change in bowel habits.  Denies marijuana or illicit substance use.  Past medical history significant for DKA, diabetes mellitus, diabetic gastroparesis, GERD, hypertension, hypothyroidism, sinus tachycardia  Home Medications Prior to Admission medications   Medication Sig Start Date End Date Taking? Authorizing Provider  acetaminophen (TYLENOL) 325 MG tablet Take 2 tablets (650 mg total) by mouth every 6 (six) hours as needed for mild pain, fever or headache. 11/22/22   Azucena Fallen, MD  amLODipine (NORVASC) 10 MG tablet Take 1 tablet (10 mg total) by mouth daily. 12/04/22   Elgergawy, Leana Roe, MD  ascorbic acid (VITAMIN C) 1000 MG tablet Take 1 tablet (1,000 mg total) by mouth daily. Patient taking differently: Take 1,000 mg by mouth once a week. 11/22/22    Azucena Fallen, MD  carvedilol (COREG) 6.25 MG tablet Take 1 tablet (6.25 mg total) by mouth 2 (two) times daily with a meal. 12/03/22   Elgergawy, Leana Roe, MD  Continuous Glucose Transmitter (DEXCOM G6 TRANSMITTER) MISC 1 Device by Does not apply route as directed. 06/18/22   Shamleffer, Konrad Dolores, MD  etonogestrel (NEXPLANON) 68 MG IMPL implant 1 each by Subdermal route continuous.    [provider]  famotidine (PEPCID) 20 MG tablet Take 1 tablet (20 mg total) by mouth 2 (two) times daily. 12/03/22   Elgergawy, Leana Roe, MD  feeding supplement, GLUCERNA SHAKE, (GLUCERNA SHAKE) LIQD Take 237 mLs by mouth 3 (three) times daily between meals. 11/22/22   Azucena Fallen, MD  gabapentin (NEURONTIN) 300 MG capsule Take 1 capsule (300 mg total) by mouth every 8 (eight) hours. 11/22/22   Azucena Fallen, MD  hydrOXYzine (ATARAX) 10 MG tablet Take 1 tablet (10 mg total) by mouth 3 (three) times daily as needed for anxiety. 03/07/23   Osvaldo Shipper, MD  insulin aspart (NOVOLOG) 100 UNIT/ML injection Inject 0-9 Units into the skin 3 (three) times daily with meals. Patient taking differently: Inject 10-30 Units into the skin 3 (three) times daily with meals. 12/03/22   Elgergawy, Leana Roe, MD  Insulin Pen Needle 32G X 4 MM MISC 1 Device by Does not apply route in the morning, at noon, in the evening, and at bedtime. 06/18/22   Shamleffer, Konrad Dolores, MD  MAGNESIUM PO Take 1 tablet by mouth daily.    [provider]  methocarbamol (ROBAXIN) 500 MG tablet  Take 500 mg by mouth 3 (three) times daily as needed for muscle spasms. 11/22/22   [provider]  metoCLOPramide (REGLAN) 10 MG tablet Take 1 tablet (10 mg total) by mouth every 6 (six) hours. 03/23/23   Arletha Pili, DO  Multiple Vitamin (MULTIVITAMIN WITH MINERALS) TABS tablet Take 1 tablet by mouth daily. 11/22/22   Azucena Fallen, MD  oxyCODONE (OXY IR/ROXICODONE) 5 MG immediate release tablet Take  1 tablet (5 mg total) by mouth every 6 (six) hours as needed for severe pain (pain score 7-10). 03/07/23   Osvaldo Shipper, MD  sucralfate (CARAFATE) 1 g tablet Take 1 g by mouth in the morning, at noon, and at bedtime. 08/28/22   [provider]  traZODone (DESYREL) 150 MG tablet Take 150 mg by mouth at bedtime. 01/14/23   [provider]      Allergies    Ibuprofen and Lisinopril    Review of Systems   Review of Systems  Gastrointestinal:  Positive for abdominal pain and vomiting.    Physical Exam Updated Vital Signs BP (!) 178/105   Pulse (!) 124   Temp 98.7 F (37.1 C) (Oral)   Resp (!) 24   SpO2 99%  Physical Exam Vitals and nursing note reviewed.  Constitutional:      General: She is not in acute distress.    Appearance: She is well-developed.  HENT:     Head: Normocephalic and atraumatic.  Eyes:     Conjunctiva/sclera: Conjunctivae normal.  Cardiovascular:     Rate and Rhythm: Normal rate and regular rhythm.     Heart sounds: No murmur heard. Pulmonary:     Effort: Pulmonary effort is normal. No respiratory distress.     Breath sounds: Normal breath sounds.  Abdominal:     Palpations: Abdomen is soft.     Tenderness: There is abdominal tenderness in the epigastric area.  Musculoskeletal:        General: No swelling.     Cervical back: Neck supple.     Comments: Bilateral AKA present.  Skin:    General: Skin is warm and dry.     Capillary Refill: Capillary refill takes less than 2 seconds.  Neurological:     Mental Status: She is alert.  Psychiatric:        Mood and Affect: Mood normal.     ED Results / Procedures / Treatments   Labs (all labs ordered are listed, but only abnormal results are displayed) Labs Reviewed  RESP PANEL BY RT-PCR (RSV, FLU A&B, COVID)  RVPGX2 - Abnormal; Notable for the following components:      Result Value   Influenza A by PCR POSITIVE (*)    All other components within normal limits  COMPREHENSIVE  METABOLIC PANEL - Abnormal; Notable for the following components:   CO2 19 (*)    Glucose, Bld 405 (*)    Creatinine, Ser 1.68 (*)    Calcium 8.6 (*)    Total Protein 6.2 (*)    Albumin 1.6 (*)    Alkaline Phosphatase 146 (*)    GFR, Estimated 41 (*)    All other components within normal limits  CBC - Abnormal; Notable for the following components:   Platelets 541 (*)    All other components within normal limits  CBG MONITORING, ED - Abnormal; Notable for the following components:   Glucose-Capillary 373 (*)    All other components within normal limits  LIPASE, BLOOD  HCG, SERUM, QUALITATIVE  URINALYSIS, ROUTINE W REFLEX MICROSCOPIC  MAGNESIUM  RAPID URINE DRUG SCREEN, HOSP PERFORMED    EKG EKG Interpretation Date/Time:  Saturday April 04 2023 18:07:17 EST Ventricular Rate:  118 PR Interval:  151 QRS Duration:  81 QT Interval:  327 QTC Calculation: 459 R Axis:   68  Text Interpretation: Sinus tachycardia Nonspecific T abnrm, anterolateral leads No significant change since last tracing Confirmed by Vanetta Mulders [phone]) on 04/04/2023 6:13:09 PM  Radiology No results found.  Procedures Procedures    Medications Ordered in ED Medications  sodium chloride 0.9 % bolus 1,000 mL (0 mLs Intravenous Stopped 04/04/23 1937)  droperidol (INAPSINE) 2.5 MG/ML injection 1.25 mg (1.25 mg Intravenous Given 04/04/23 1753)  benzonatate (TESSALON) capsule 100 mg (100 mg Oral Given 04/04/23 1753)  potassium chloride SA (KLOR-CON M) CR tablet 40 mEq (40 mEq Oral Given 04/04/23 1937)  LORazepam (ATIVAN) injection 1 mg (1 mg Intravenous Given 04/04/23 1937)  metoCLOPramide (REGLAN) injection 10 mg (10 mg Intravenous Given 04/04/23 1938)  acetaminophen (TYLENOL) tablet 1,000 mg (1,000 mg Oral Given 04/04/23 1938)  insulin aspart (novoLOG) injection 8 Units (8 Units Subcutaneous Given 04/04/23 1951)  morphine (PF) 4 MG/ML injection 4 mg (4 mg Intravenous Given 04/04/23 1951)    ED Course/  Medical Decision Making/ A&P                                 Medical Decision Making Amount and/or Complexity of Data Reviewed Labs: ordered.  Risk OTC drugs. Prescription drug management.   This patient presents to the ED for concern of abdominal pain, nausea, vomiting, this involves an extensive number of treatment options, and is a complaint that carries with it a high risk of complications and morbidity.  The differential diagnosis includes DKA, HHS, gastroparesis, CBD pathology, cholecystitis, SBO/LBO, volvulus, colitis, viral gastroenteritis, other   Co morbidities that complicate the patient evaluation  See HPI   Additional history obtained:  Additional history obtained from EMR External records from outside source obtained and reviewed including hospital records   Lab Tests:  I Ordered, and personally interpreted labs.  The pertinent results include: No leukocytosis.  No evidence of anemia.  Normocytosis at 541.  Decreased bicarb of 19, hypocalcemia of 8.6.  No transaminitis.  Patient with creatinine 1.68, BUN of 28 GFR 41.  Lipase within normal limits.  hCG negative.  UA pending.   Imaging Studies ordered:  N/a   Cardiac Monitoring: / EKG:  The patient was maintained on a cardiac monitor.  I personally viewed and interpreted the cardiac monitored which showed an underlying rhythm of: Sinus tachycardia cardia   Consultations Obtained:  N/a   Problem List / ED Course / Critical interventions / Medication management  Epigastric abdominal pain, nausea, vomiting I ordered medication including normal saline, Tessalon Perles, droperidol potassium chloride, Tylenol, Reglan, morphine, NovoLog   Reevaluation of the patient after these medicines showed that the patient improved I have reviewed the patients home medicines and have made adjustments as needed   Social Determinants of Health:  Denies tobacco, licit drug use.   Test / Admission -  Considered:  Epigastric abdominal pain, nausea, vomiting Vitals signs significant for tachycardia heart rate 120, hypertension blood pressure 155. Otherwise within normal range and stable throughout visit. Laboratory/imaging studies significant for: See above 33 year old female presents emergency department close abdominal pain, nausea, vomiting.  Symptoms can early this morning.  Patient with history of  similar presentation in the past requiring multiple admissions.  On exam, most tenderness epigastric region.  Laboratory studies concerning for creatinine 1.68, BUN of 28, GFR 41.  Patient with class II/IIIa renal functions with slight AKI.  Viral testing positive for influenza A which could be worsening patient's chronic symptoms.  No evidence of DKA on laboratory studies.  Initial antibiotic as well as IV fluids administered and patient noted no improvement of symptoms.  Patient with subsequent emesis and feelings of nausea.  Attempting second trial of antiemetic as well as medication for patient's abdominal pain and pending reassessment.  If patient fails p.o. trial, will require admission.  At time of shift change, patient care after [place].  Patient stable upon handoff.         Final Clinical Impression(s) / ED Diagnoses Final diagnoses:  Nausea and vomiting, unspecified vomiting type  Influenza A  Hyperglycemia  Epigastric abdominal pain    Rx / DC Orders ED Discharge Orders     None         Peter Garter, [place] 04/04/23 Michel Santee, MD 04/04/23 2238

## 2023-04-04 NOTE — ED Notes (Signed)
Nurse offered fluids and food x3, pt refused. Will continue to offer.

## 2023-04-04 NOTE — ED Triage Notes (Signed)
Patient presents due to abdominal pain, nausea and vomiting since 1000 this AM. Low PO intake today.   HX AKA  EMS vitals: 190/112 BP 128 HR 98% RA 22-25 RR 485 CBG

## 2023-04-04 NOTE — ED Provider Notes (Signed)
Received signout from previous provider, please see his note for complete H&P.  33 year old female presenting with complaint of abdominal pain and some nausea and vomiting since early this morning.  Workup notable for positive influenza.  Patient initially was hyperglycemic but it has improved with IV fluid and insulin.  At this time she is able to tolerate some fluid but does not want to eat any food.  Option of hospitalization was offered but patient felt comfortable going home.  Please note blood pressure currently is 96/59 and she is tachycardic with heart rate of 113.  She is afebrile.  She has no white count.  I strong encouraged patient to return if her condition worsen.  BP 97/66   Pulse 95   Temp 98.5 F (36.9 C) (Oral)   Resp 16   SpO2 99%   Results for orders placed or performed during the hospital encounter of 04/04/23  CBG monitoring, ED   Collection Time: 04/04/23  4:57 PM  Result Value Ref Range   Glucose-Capillary 373 (H) 70 - 99 mg/dL   Comment 1 Notify RN   Lipase, blood   Collection Time: 04/04/23  5:05 PM  Result Value Ref Range   Lipase 30 11 - 51 U/L  Comprehensive metabolic panel   Collection Time: 04/04/23  5:05 PM  Result Value Ref Range   Sodium 137 135 - 145 mmol/L   Potassium 3.6 3.5 - 5.1 mmol/L   Chloride 105 98 - 111 mmol/L   CO2 19 (L) 22 - 32 mmol/L   Glucose, Bld 405 (H) 70 - 99 mg/dL   BUN 20 6 - 20 mg/dL   Creatinine, Ser 1.61 (H) 0.44 - 1.00 mg/dL   Calcium 8.6 (L) 8.9 - 10.3 mg/dL   Total Protein 6.2 (L) 6.5 - 8.1 g/dL   Albumin 1.6 (L) 3.5 - 5.0 g/dL   AST 22 15 - 41 U/L   ALT 26 0 - 44 U/L   Alkaline Phosphatase 146 (H) 38 - 126 U/L   Total Bilirubin 0.8 0.0 - 1.2 mg/dL   GFR, Estimated 41 (L) >60 mL/min   Anion gap 13 5 - 15  CBC   Collection Time: 04/04/23  5:05 PM  Result Value Ref Range   WBC 8.7 4.0 - 10.5 K/uL   RBC 4.55 3.87 - 5.11 MIL/uL   Hemoglobin 13.0 12.0 - 15.0 g/dL   HCT 09.6 04.5 - 40.9 %   MCV 90.8 80.0 - 100.0  fL   MCH 28.6 26.0 - 34.0 pg   MCHC 31.5 30.0 - 36.0 g/dL   RDW 81.1 91.4 - 78.2 %   Platelets 541 (H) 150 - 400 K/uL   nRBC 0.0 0.0 - 0.2 %  hCG, serum, qualitative   Collection Time: 04/04/23  5:05 PM  Result Value Ref Range   Preg, Serum NEGATIVE NEGATIVE  Resp panel by RT-PCR (RSV, Flu A&B, Covid) Anterior Nasal Swab   Collection Time: 04/04/23  5:58 PM   Specimen: Anterior Nasal Swab  Result Value Ref Range   SARS Coronavirus 2 by RT PCR NEGATIVE NEGATIVE   Influenza A by PCR POSITIVE (A) NEGATIVE   Influenza B by PCR NEGATIVE NEGATIVE   Resp Syncytial Virus by PCR NEGATIVE NEGATIVE  Magnesium   Collection Time: 04/04/23  5:58 PM  Result Value Ref Range   Magnesium 2.1 1.7 - 2.4 mg/dL  CBG monitoring, ED   Collection Time: 04/04/23  9:16 PM  Result Value Ref Range  Glucose-Capillary 242 (H) 70 - 99 mg/dL   CT ABDOMEN PELVIS W CONTRAST Result Date: 03/24/2023 CLINICAL DATA:  Abdominal pain. EXAM: CT ABDOMEN AND PELVIS WITH CONTRAST TECHNIQUE: Multidetector CT imaging of the abdomen and pelvis was performed using the standard protocol following bolus administration of intravenous contrast. RADIATION DOSE REDUCTION: This exam was performed according to the departmental dose-optimization program which includes automated exposure control, adjustment of the mA and/or kV according to patient size and/or use of iterative reconstruction technique. CONTRAST:  OMNIPAQUE IOHEXOL 300 MG/ML  SOLN COMPARISON:  November 28, 2022 FINDINGS: Lower chest: No acute abnormality. Hepatobiliary: No focal liver abnormality is seen. No gallstones, gallbladder wall thickening, or biliary dilatation. Pancreas: Unremarkable. No pancreatic ductal dilatation or surrounding inflammatory changes. Spleen: Normal in size without focal abnormality. Adrenals/Urinary Tract: Adrenal glands are unremarkable. Kidneys are normal, without renal calculi, focal lesion, or hydronephrosis. The urinary bladder is markedly  distended and is otherwise unremarkable. Stomach/Bowel: There is a small hiatal hernia with mild to moderate severity thickening of the visualized portion of the distal esophagus. Stable gastric stimulator implant positioning is seen. Appendix appears normal. No evidence of bowel dilatation. Moderate severity diffuse colonic wall thickening is noted. Poorly distended large bowel is also seen. Vascular/Lymphatic: No significant vascular findings are present. No enlarged abdominal or pelvic lymph nodes. Reproductive: Uterus and bilateral adnexa are unremarkable. Other: There is mild to moderate severity diffuse anasarca. No abdominopelvic ascites. Musculoskeletal: No acute or significant osseous findings. IMPRESSION: 1. Small hiatal hernia with mild to moderate severity thickening of the visualized portion of the distal esophagus. While this may represent sequelae associated with distal esophagitis, GI consultation is recommended. 2. Moderate severity diffuse colonic wall thickening which may represent sequelae associated with colitis. 3. Mild to moderate severity diffuse anasarca. Electronically Signed   By: Aram Candela M.D.   On: 03/24/2023 17:57   DG Abd Portable 1V Result Date: 03/06/2023 CLINICAL DATA:  Nausea and vomiting. EXAM: PORTABLE ABDOMEN - 1 VIEW COMPARISON:  02/21/2023 FINDINGS: Normal bowel gas pattern. Stable left mid abdominal gastric stimulator generator and leads. Unremarkable bones. Stable left pelvic phlebolith. IMPRESSION: No acute abnormality. Electronically Signed   By: Beckie Salts M.D.   On: 03/06/2023 15:09       Fayrene Helper, PA-C 04/04/23 2255    Lonell Grandchild, MD 04/05/23 [phone]

## 2023-04-04 NOTE — ED Notes (Addendum)
Encouraged pt to provide urine sample, pt states she is unable to provide urine at this time. Will continue to encourage.

## 2023-04-05 ENCOUNTER — Inpatient Hospital Stay (HOSPITAL_COMMUNITY)
Admission: EM | Admit: 2023-04-05 | Discharge: 2023-04-07 | DRG: 440 | Disposition: A | Discharge status: Home / Self Care | Payer: 59 | Attending: Internal Medicine | Admitting: Internal Medicine

## 2023-04-05 ENCOUNTER — Other Ambulatory Visit: Payer: Self-pay

## 2023-04-05 ENCOUNTER — Observation Stay (HOSPITAL_COMMUNITY): Payer: 59

## 2023-04-05 ENCOUNTER — Emergency Department (HOSPITAL_COMMUNITY): Payer: 59

## 2023-04-05 DIAGNOSIS — Z833 Family history of diabetes mellitus: Secondary | ICD-10-CM

## 2023-04-05 DIAGNOSIS — R748 Abnormal levels of other serum enzymes: Secondary | ICD-10-CM | POA: Diagnosis not present

## 2023-04-05 DIAGNOSIS — J101 Influenza due to other identified influenza virus with other respiratory manifestations: Secondary | ICD-10-CM | POA: Diagnosis present

## 2023-04-05 DIAGNOSIS — G894 Chronic pain syndrome: Secondary | ICD-10-CM | POA: Diagnosis present

## 2023-04-05 DIAGNOSIS — Z993 Dependence on wheelchair: Secondary | ICD-10-CM

## 2023-04-05 DIAGNOSIS — E1065 Type 1 diabetes mellitus with hyperglycemia: Secondary | ICD-10-CM | POA: Diagnosis present

## 2023-04-05 DIAGNOSIS — Z8739 Personal history of other diseases of the musculoskeletal system and connective tissue: Secondary | ICD-10-CM

## 2023-04-05 DIAGNOSIS — E1022 Type 1 diabetes mellitus with diabetic chronic kidney disease: Secondary | ICD-10-CM | POA: Diagnosis present

## 2023-04-05 DIAGNOSIS — K3184 Gastroparesis: Secondary | ICD-10-CM | POA: Diagnosis not present

## 2023-04-05 DIAGNOSIS — G4700 Insomnia, unspecified: Secondary | ICD-10-CM | POA: Diagnosis present

## 2023-04-05 DIAGNOSIS — F32A Depression, unspecified: Secondary | ICD-10-CM | POA: Diagnosis present

## 2023-04-05 DIAGNOSIS — I129 Hypertensive chronic kidney disease with stage 1 through stage 4 chronic kidney disease, or unspecified chronic kidney disease: Secondary | ICD-10-CM | POA: Diagnosis present

## 2023-04-05 DIAGNOSIS — E1143 Type 2 diabetes mellitus with diabetic autonomic (poly)neuropathy: Secondary | ICD-10-CM | POA: Diagnosis present

## 2023-04-05 DIAGNOSIS — Z794 Long term (current) use of insulin: Secondary | ICD-10-CM

## 2023-04-05 DIAGNOSIS — K859 Acute pancreatitis without necrosis or infection, unspecified: Principal | ICD-10-CM | POA: Diagnosis present

## 2023-04-05 DIAGNOSIS — E039 Hypothyroidism, unspecified: Secondary | ICD-10-CM | POA: Diagnosis present

## 2023-04-05 DIAGNOSIS — Z8619 Personal history of other infectious and parasitic diseases: Secondary | ICD-10-CM

## 2023-04-05 DIAGNOSIS — E876 Hypokalemia: Secondary | ICD-10-CM | POA: Diagnosis present

## 2023-04-05 DIAGNOSIS — Z1152 Encounter for screening for COVID-19: Secondary | ICD-10-CM

## 2023-04-05 DIAGNOSIS — Z818 Family history of other mental and behavioral disorders: Secondary | ICD-10-CM

## 2023-04-05 DIAGNOSIS — N1831 Chronic kidney disease, stage 3a: Secondary | ICD-10-CM | POA: Diagnosis present

## 2023-04-05 DIAGNOSIS — E1043 Type 1 diabetes mellitus with diabetic autonomic (poly)neuropathy: Secondary | ICD-10-CM | POA: Diagnosis present

## 2023-04-05 DIAGNOSIS — K219 Gastro-esophageal reflux disease without esophagitis: Secondary | ICD-10-CM | POA: Diagnosis present

## 2023-04-05 DIAGNOSIS — Z89612 Acquired absence of left leg above knee: Secondary | ICD-10-CM

## 2023-04-05 DIAGNOSIS — F419 Anxiety disorder, unspecified: Secondary | ICD-10-CM | POA: Diagnosis present

## 2023-04-05 DIAGNOSIS — Z89611 Acquired absence of right leg above knee: Secondary | ICD-10-CM

## 2023-04-05 DIAGNOSIS — Z79899 Other long term (current) drug therapy: Secondary | ICD-10-CM

## 2023-04-05 LAB — HEPATIC FUNCTION PANEL
ALT: 17 U/L (ref 0–44)
AST: 16 U/L (ref 15–41)
Albumin: <1.5 g/dL — ABNORMAL LOW (ref 3.5–5.0)
Alkaline Phosphatase: 100 U/L (ref 38–126)
Bilirubin, Direct: <0.1 mg/dL (ref 0.0–0.2)
Total Bilirubin: 0.6 mg/dL (ref 0.0–1.2)
Total Protein: 5.2 g/dL — ABNORMAL LOW (ref 6.5–8.1)

## 2023-04-05 LAB — COMPREHENSIVE METABOLIC PANEL
ALT: 17 U/L (ref 0–44)
AST: 16 U/L (ref 15–41)
Albumin: <1.5 g/dL — ABNORMAL LOW (ref 3.5–5.0)
Alkaline Phosphatase: 106 U/L (ref 38–126)
Anion gap: 10 (ref 5–15)
BUN: 19 mg/dL (ref 6–20)
CO2: 19 mmol/L — ABNORMAL LOW (ref 22–32)
Calcium: 8.1 mg/dL — ABNORMAL LOW (ref 8.9–10.3)
Chloride: 108 mmol/L (ref 98–111)
Creatinine, Ser: 1.35 mg/dL — ABNORMAL HIGH (ref 0.44–1.00)
GFR, Estimated: 54 mL/min — ABNORMAL LOW (ref >60)
Glucose, Bld: 284 mg/dL — ABNORMAL HIGH (ref 70–99)
Potassium: 3.5 mmol/L (ref 3.5–5.1)
Sodium: 137 mmol/L (ref 135–145)
Total Bilirubin: 0.4 mg/dL (ref 0.0–1.2)
Total Protein: 5.2 g/dL — ABNORMAL LOW (ref 6.5–8.1)

## 2023-04-05 LAB — LACTIC ACID, PLASMA: Lactic Acid, Venous: 1.8 mmol/L (ref 0.5–1.9)

## 2023-04-05 LAB — CBC WITH DIFFERENTIAL/PLATELET
Abs Immature Granulocytes: 0.07 10*3/uL (ref 0.00–0.07)
Basophils Absolute: 0 10*3/uL (ref 0.0–0.1)
Basophils Relative: 0 %
Eosinophils Absolute: 0 10*3/uL (ref 0.0–0.5)
Eosinophils Relative: 0 %
HCT: 32.4 % — ABNORMAL LOW (ref 36.0–46.0)
Hemoglobin: 10.1 g/dL — ABNORMAL LOW (ref 12.0–15.0)
Immature Granulocytes: 1 %
Lymphocytes Relative: 22 %
Lymphs Abs: 3.2 10*3/uL (ref 0.7–4.0)
MCH: 28.3 pg (ref 26.0–34.0)
MCHC: 31.2 g/dL (ref 30.0–36.0)
MCV: 90.8 fL (ref 80.0–100.0)
Monocytes Absolute: 0.8 10*3/uL (ref 0.1–1.0)
Monocytes Relative: 6 %
Neutro Abs: 10.3 10*3/uL — ABNORMAL HIGH (ref 1.7–7.7)
Neutrophils Relative %: 71 %
Platelets: 546 10*3/uL — ABNORMAL HIGH (ref 150–400)
RBC: 3.57 MIL/uL — ABNORMAL LOW (ref 3.87–5.11)
RDW: 14.9 % (ref 11.5–15.5)
WBC: 14.4 10*3/uL — ABNORMAL HIGH (ref 4.0–10.5)
nRBC: 0 % (ref 0.0–0.2)

## 2023-04-05 LAB — LIPASE, BLOOD: Lipase: 355 U/L — ABNORMAL HIGH (ref 11–51)

## 2023-04-05 LAB — MAGNESIUM: Magnesium: 1.6 mg/dL — ABNORMAL LOW (ref 1.7–2.4)

## 2023-04-05 LAB — BLOOD GAS, VENOUS
Acid-base deficit: 1.7 mmol/L (ref 0.0–2.0)
Bicarbonate: 19.2 mmol/L — ABNORMAL LOW (ref 20.0–28.0)
O2 Saturation: 97.9 %
Patient temperature: 37
pCO2, Ven: 23 mm[Hg] — ABNORMAL LOW (ref 44–60)
pH, Ven: 7.53 — ABNORMAL HIGH (ref 7.25–7.43)
pO2, Ven: 134 mm[Hg] — ABNORMAL HIGH (ref 32–45)

## 2023-04-05 LAB — CK: Total CK: 143 U/L (ref 38–234)

## 2023-04-05 LAB — TROPONIN I (HIGH SENSITIVITY): Troponin I (High Sensitivity): 8 ng/L (ref <18)

## 2023-04-05 LAB — BETA-HYDROXYBUTYRIC ACID: Beta-Hydroxybutyric Acid: 1.43 mmol/L — ABNORMAL HIGH (ref 0.05–0.27)

## 2023-04-05 LAB — PHOSPHORUS: Phosphorus: 2.6 mg/dL (ref 2.5–4.6)

## 2023-04-05 LAB — HCG, SERUM, QUALITATIVE: Preg, Serum: NEGATIVE

## 2023-04-05 LAB — TSH: TSH: 2.756 u[IU]/mL (ref 0.350–4.500)

## 2023-04-05 MED ORDER — METOCLOPRAMIDE HCL 5 MG/ML IJ SOLN
10.0000 mg | Freq: Three times a day (TID) | INTRAMUSCULAR | Status: DC
  Administered 2023-04-06 – 2023-04-07 (×4): 10 mg via INTRAVENOUS
  Filled 2023-04-05 (×4): qty 2

## 2023-04-05 MED ORDER — PANTOPRAZOLE SODIUM 40 MG IV SOLR
40.0000 mg | Freq: Once | INTRAVENOUS | Status: AC
  Administered 2023-04-05: 40 mg via INTRAVENOUS
  Filled 2023-04-05: qty 10

## 2023-04-05 MED ORDER — MORPHINE SULFATE (PF) 4 MG/ML IV SOLN
4.0000 mg | Freq: Once | INTRAVENOUS | Status: AC
  Administered 2023-04-05: 4 mg via INTRAVENOUS
  Filled 2023-04-05: qty 1

## 2023-04-05 MED ORDER — INSULIN ASPART 100 UNIT/ML IJ SOLN
0.0000 [IU] | INTRAMUSCULAR | Status: DC
  Administered 2023-04-06: 3 [IU] via SUBCUTANEOUS
  Administered 2023-04-06: 1 [IU] via SUBCUTANEOUS
  Filled 2023-04-05: qty 0.09

## 2023-04-05 MED ORDER — SODIUM CHLORIDE 0.9 % IV BOLUS
1000.0000 mL | Freq: Once | INTRAVENOUS | Status: AC
  Administered 2023-04-05: 1000 mL via INTRAVENOUS

## 2023-04-05 MED ORDER — DICYCLOMINE HCL 10 MG/ML IM SOLN
20.0000 mg | Freq: Once | INTRAMUSCULAR | Status: AC
  Administered 2023-04-05: 20 mg via INTRAMUSCULAR
  Filled 2023-04-05: qty 2

## 2023-04-05 MED ORDER — IOHEXOL 300 MG/ML  SOLN
100.0000 mL | Freq: Once | INTRAMUSCULAR | Status: AC | PRN
  Administered 2023-04-05: 100 mL via INTRAVENOUS

## 2023-04-05 MED ORDER — METOCLOPRAMIDE HCL 5 MG/ML IJ SOLN
10.0000 mg | Freq: Once | INTRAMUSCULAR | Status: AC
  Administered 2023-04-05: 10 mg via INTRAVENOUS
  Filled 2023-04-05: qty 2

## 2023-04-05 NOTE — ED Provider Notes (Signed)
[place] EMERGENCY DEPARTMENT AT [facility] Provider Note   CSN: [id number] Arrival date & time: 04/05/23  1803     History  Chief Complaint  Patient presents with   Emesis   Abdominal Pain    Kelli Hudson is a 33 y.o. female history of hypertension diabetes, type 1 diabetes, epigastric paresis, GERD, prolonged QT on EKG, necrotizing fasciitis, C. difficile presented for nausea vomiting abdominal pain.  Patient was seen yesterday and diagnosed with the flu and ultimately discharged.  Patient states she has had too many episodes of emesis to count.  Patient is repeatedly asking for Dilaudid for her abdominal pain.  Patient denies any changes of symptoms since yesterday.  Patient denies chest pain, shortness of breath, fevers  Home Medications Prior to Admission medications   Medication Sig Start Date End Date Taking? Authorizing Provider  acetaminophen (TYLENOL) 500 MG tablet Take 1 tablet (500 mg total) by mouth every 6 (six) hours as needed. 04/04/23   Fayrene Helper, PA-C  amLODipine (NORVASC) 10 MG tablet Take 1 tablet (10 mg total) by mouth daily. 12/04/22   Elgergawy, Leana Roe, MD  ascorbic acid (VITAMIN C) 1000 MG tablet Take 1 tablet (1,000 mg total) by mouth daily. Patient taking differently: Take 1,000 mg by mouth once a week. 11/22/22   Azucena Fallen, MD  benzonatate (TESSALON) 100 MG capsule Take 1 capsule (100 mg total) by mouth every 8 (eight) hours. 04/04/23   Fayrene Helper, PA-C  carvedilol (COREG) 6.25 MG tablet Take 1 tablet (6.25 mg total) by mouth 2 (two) times daily with a meal. 12/03/22   Elgergawy, Leana Roe, MD  Continuous Glucose Transmitter (DEXCOM G6 TRANSMITTER) MISC 1 Device by Does not apply route as directed. 06/18/22   Shamleffer, Konrad Dolores, MD  etonogestrel (NEXPLANON) 68 MG IMPL implant 1 each by Subdermal route continuous.    [provider]  famotidine (PEPCID) 20 MG tablet Take 1 tablet (20 mg total) by mouth  2 (two) times daily. 12/03/22   Elgergawy, Leana Roe, MD  feeding supplement, GLUCERNA SHAKE, (GLUCERNA SHAKE) LIQD Take 237 mLs by mouth 3 (three) times daily between meals. 11/22/22   Azucena Fallen, MD  gabapentin (NEURONTIN) 300 MG capsule Take 1 capsule (300 mg total) by mouth every 8 (eight) hours. 11/22/22   Azucena Fallen, MD  hydrOXYzine (ATARAX) 10 MG tablet Take 1 tablet (10 mg total) by mouth 3 (three) times daily as needed for anxiety. 03/07/23   Osvaldo Shipper, MD  insulin aspart (NOVOLOG) 100 UNIT/ML injection Inject 0-9 Units into the skin 3 (three) times daily with meals. Patient taking differently: Inject 10-30 Units into the skin 3 (three) times daily with meals. 12/03/22   Elgergawy, Leana Roe, MD  Insulin Pen Needle 32G X 4 MM MISC 1 Device by Does not apply route in the morning, at noon, in the evening, and at bedtime. 06/18/22   Shamleffer, Konrad Dolores, MD  MAGNESIUM PO Take 1 tablet by mouth daily.    [provider]  methocarbamol (ROBAXIN) 500 MG tablet Take 500 mg by mouth 3 (three) times daily as needed for muscle spasms. 11/22/22   [provider]  metoCLOPramide (REGLAN) 10 MG tablet Take 1 tablet (10 mg total) by mouth every 6 (six) hours. 03/23/23   Arletha Pili, DO  Multiple Vitamin (MULTIVITAMIN WITH MINERALS) TABS tablet Take 1 tablet by mouth daily. 11/22/22   Azucena Fallen, MD  oseltamivir (TAMIFLU) 75 MG capsule Take  1 capsule (75 mg total) by mouth every 12 (twelve) hours. 04/04/23   Fayrene Helper, PA-C  oxyCODONE (OXY IR/ROXICODONE) 5 MG immediate release tablet Take 1 tablet (5 mg total) by mouth every 6 (six) hours as needed for severe pain (pain score 7-10). 03/07/23   Osvaldo Shipper, MD  sucralfate (CARAFATE) 1 g tablet Take 1 g by mouth in the morning, at noon, and at bedtime. 08/28/22   [provider]  traZODone (DESYREL) 150 MG tablet Take 150 mg by mouth at bedtime. 01/14/23   [provider]       Allergies    Ibuprofen and Lisinopril    Review of Systems   Review of Systems  Gastrointestinal:  Positive for abdominal pain and vomiting.    Physical Exam Updated Vital Signs BP (!) 189/123 (BP Location: Right Arm)   Pulse (!) 113   Temp 97.8 F (36.6 C) (Oral)   Resp 11   SpO2 100%  Physical Exam Constitutional:      General: She is in acute distress.  Cardiovascular:     Rate and Rhythm: Normal rate and regular rhythm.     Pulses: Normal pulses.     Heart sounds: Normal heart sounds.  Pulmonary:     Effort: Pulmonary effort is normal. No respiratory distress.     Breath sounds: Normal breath sounds.  Abdominal:     Palpations: Abdomen is soft.     Tenderness: There is no abdominal tenderness. There is no guarding or rebound.  Skin:    General: Skin is warm and dry.     Capillary Refill: Capillary refill takes less than 2 seconds.  Neurological:     Mental Status: She is alert and oriented to person, place, and time.  Psychiatric:        Mood and Affect: Mood normal.     ED Results / Procedures / Treatments   Labs (all labs ordered are listed, but only abnormal results are displayed) Labs Reviewed  COMPREHENSIVE METABOLIC PANEL - Abnormal; Notable for the following components:      Result Value   CO2 19 (*)    Glucose, Bld 284 (*)    Creatinine, Ser 1.35 (*)    Calcium 8.1 (*)    Total Protein 5.2 (*)    Albumin <1.5 (*)    GFR, Estimated 54 (*)    All other components within normal limits  LIPASE, BLOOD - Abnormal; Notable for the following components:   Lipase 355 (*)    All other components within normal limits  BETA-HYDROXYBUTYRIC ACID - Abnormal; Notable for the following components:   Beta-Hydroxybutyric Acid 1.43 (*)    All other components within normal limits  BLOOD GAS, VENOUS - Abnormal; Notable for the following components:   pH, Ven 7.53 (*)    pCO2, Ven 23 (*)    pO2, Ven 134 (*)    Bicarbonate 19.2 (*)    All other components  within normal limits  HCG, SERUM, QUALITATIVE  CBC WITH DIFFERENTIAL/PLATELET  URINALYSIS, ROUTINE W REFLEX MICROSCOPIC  CBC WITH DIFFERENTIAL/PLATELET    EKG None  Radiology No results found.  Procedures Procedures    Medications Ordered in ED Medications  sodium chloride 0.9 % bolus 1,000 mL (1,000 mLs Intravenous New Bag/Given 04/05/23 2025)  pantoprazole (PROTONIX) injection 40 mg (40 mg Intravenous Given 04/05/23 2023)  metoCLOPramide (REGLAN) injection 10 mg (10 mg Intravenous Given 04/05/23 2023)  dicyclomine (BENTYL) injection 20 mg (20 mg Intramuscular Given 04/05/23 2047)  morphine (PF) 4 MG/ML injection 4 mg (4 mg Intravenous Given 04/05/23 2149)    ED Course/ Medical Decision Making/ A&P                                 Medical Decision Making Amount and/or Complexity of Data Reviewed Labs: ordered. Radiology: ordered.  Risk Prescription drug management. Decision regarding hospitalization.   Latria Mccarron Johnson-Alston 33 y.o. presented today for abdominal pain.  Working DDx that I considered at this time includes, but not limited to, gastroenteritis, colitis, small bowel obstruction, appendicitis, cholecystitis, hepatobiliary pathology, gastritis, PUD, ACS, aortic dissection, diverticulosis/diverticulitis, pancreatitis, nephrolithiasis, medication induced, AAA, UTI, pyelonephritis, ruptured ectopic pregnancy, PID, ovarian torsion.  R/o DDx: gastroenteritis, colitis, small bowel obstruction, appendicitis, cholecystitis, hepatobiliary pathology, gastritis, PUD, ACS, aortic dissection, diverticulosis/diverticulitis, nephrolithiasis, medication induced, AAA, UTI, pyelonephritis, ruptured ectopic pregnancy, PID, ovarian torsion: These are considered less likely due to history of present illness, physical exam, labs/imaging findings.  Review of prior external notes: 04/04/23 ED  Unique Tests and My Independent Interpretation:  CBC with differential: Pending CMP:  Hyperglycemia 284, creatinine 1.35 Lipase: 355 UA: Pending Serum pregnancy: Negative Beta hydroxybutyric acid: 1.43 EKG: Rate, rhythm, axis, intervals all examined and without medically relevant abnormality. ST segments without concerns for elevations CT Abd/Pelvis with contrast: Pending  Social Determinants of Health: EtOH/Substance Abuse  Discussion with Independent Historian: None  Discussion of Management of Tests:  [place], MD Hospitalist  Risk: High: hospitalization or escalation of hospital-level care  Risk Stratification Score: None  Plan: On exam patient was in acute distress with stable vitals.  Patient seen yesterday and diagnosed with the flu which I suspect is causing her symptoms as well as possible exacerbation of her diabetic gastroparesis.  Patient repeatedly keeps asking for Dilaudid however does have care plan to avoid opiates and with her reassuring workup yesterday and and same symptoms today we will treat with nonopiate options such as Protonix Reglan and fluids.  Will recheck labs to make sure patient kidney function is stable along with electrolytes.  Patient did not have any peritoneal signs on exam and is not endorsing fevers and so at this time after testing positive for yesterday we will withhold CT imaging.  Patient's labs came back showing elevated lipase from 30-355.  Do feel patient's pain is pancreatitis related.  Patient still having pain so we will give morphine.  Given the fact we have tried outpatient therapy and has failed do feel it is not unreasonable to admit the patient for pain management due to pancreatitis and will consult hospitalist.  Patient stable to be admitted.  I spoke to hospitalist and patient was accepted for admission.  Patient stable to be admitted.  Hospitalist request CT scans of this was ordered as well.  This chart was dictated using voice recognition software.  Despite best efforts to proofread,  errors can occur which can change  the documentation meaning.         Final Clinical Impression(s) / ED Diagnoses Final diagnoses:  Acute pancreatitis, unspecified complication status, unspecified pancreatitis type    Rx / DC Orders ED Discharge Orders     None         Remi Deter 04/05/23 2208    Charlynne Pander, MD 04/05/23 [phone]

## 2023-04-05 NOTE — ED Notes (Signed)
Unsuccessful IV attempts- pt states she usually need [place] IV

## 2023-04-05 NOTE — ED Notes (Signed)
Ct arrived to take pt. Pt asking for pain medication before transport to CT. Provider notified. CT advised they would come back after pt was ready.

## 2023-04-05 NOTE — Assessment & Plan Note (Signed)
Continue tamiflu Check CXR

## 2023-04-05 NOTE — Assessment & Plan Note (Signed)
Possibly due to dehydration, will rehydrate,   Rpeat in AM   Order ct abd to evaluate for possible pancreatitis

## 2023-04-05 NOTE — Assessment & Plan Note (Signed)
Order sliding scale  

## 2023-04-05 NOTE — ED Notes (Signed)
[facility] called to arrange transport for pt d/c home.

## 2023-04-05 NOTE — Assessment & Plan Note (Signed)
Reglan PRN

## 2023-04-05 NOTE — H&P (Signed)
Kelli Hudson ZOX:[id number] DOB: 01-07-1991 DOA: 04/05/2023     PCP: Cristino Martes, NP   Outpatient Specialists: * NONE CARDS: * Dr. None  NEphrology: *  Dr. No care team member to display  NEurology *   Dr. Pulmonary *  Dr.  Oncology * Dr.No care team member to display  GI* Dr.  Deboraha Sprang, LB) Armbruster, Willaim Rayas, MD Urology Dr. *  Patient arrived to ER on 04/05/23 at 1803 Referred by Attending Charlynne Pander, MD   Patient coming from:    home Lives alone,   *** With family    Chief Complaint:   Chief Complaint  Patient presents with   Emesis   Abdominal Pain    HPI: Kelli Hudson is a 33 y.o. female with medical history significant of  HTN, DM1, gastroparesis, GERD prolonged QT necrotizing fasciitis C. difficile colitis bilateral AKA    Presented with nausea vomiting Pt comes in from home with nausea vomiting abdominal pain History of gastroparesis feels that this is a flare She was seen yesterday for the same and was able to be discharged to home Patient is diabetic CBG 312 Type 1 diabetes Status post bilateral AKA Reports very numerous episodes of nausea vomiting too numerous to count Patient has been requesting for Dilaudid for abdominal pain no improvement since yesterday  Reportedly was diagnosed with influenza yesterday    Denies significant ETOH intake *** Does not smoke*** but interested in quitting***  Lab Results  Component Value Date   SARSCOV2NAA NEGATIVE 04/04/2023   SARSCOV2NAA NEGATIVE 03/02/2023   SARSCOV2NAA NEGATIVE 10/03/2022   SARSCOV2NAA NEGATIVE 04/10/2022    Regarding pertinent Chronic problems:       HTN on Norvasc Coreg      DM 1 -  Lab Results  Component Value Date   HGBA1C 7.2 (H) 03/02/2023   on insulin,       CKD stage IIIb baseline Cr 1.4 Estimated Creatinine Clearance: 59.7 mL/min (A) (by C-G formula based on SCr of 1.35 mg/dL (H)).  Lab Results  Component Value Date    CREATININE 1.35 (H) 04/05/2023   CREATININE 1.68 (H) 04/04/2023   CREATININE 1.39 (H) 03/28/2023   Lab Results  Component Value Date   NA 137 04/05/2023   CL 108 04/05/2023   K 3.5 04/05/2023   CO2 19 (L) 04/05/2023   BUN 19 04/05/2023   CREATININE 1.35 (H) 04/05/2023   GFRNONAA 54 (L) 04/05/2023   CALCIUM 8.1 (L) 04/05/2023   PHOS 3.5 01/22/2023   ALBUMIN <1.5 (L) 04/05/2023   GLUCOSE 284 (H) 04/05/2023      Chronic anemia - baseline hg Hemoglobin & Hematocrit  Recent Labs    03/24/23 1618 03/25/23 0900 04/04/23 1705  HGB 11.6* 9.1* 13.0   Iron/TIBC/Ferritin/ %Sat    Component Value Date/Time   IRON <5 (L) 10/31/2022 0304   TIBC NOT CALCULATED 10/31/2022 0304   FERRITIN 722 (H) 10/31/2022 0304   IRONPCTSAT NOT CALCULATED 10/31/2022 0304   IRONPCTSAT 17 06/27/2020 1336    While in ER:    Dehydrated, lipase 355      Lab Orders         Comprehensive metabolic panel         Lipase, blood         CBC with Diff         Urinalysis, Routine w reflex microscopic -Urine, Clean Catch         hCG, serum, qualitative  Beta-hydroxybutyric acid         Blood gas, venous (at [facility] and [facility])         CBC with Differential/Platelet       CTabd/pelvis - ordered    Following Medications were ordered in ER: Medications  sodium chloride 0.9 % bolus 1,000 mL (1,000 mLs Intravenous New Bag/Given 04/05/23 2025)  pantoprazole (PROTONIX) injection 40 mg (40 mg Intravenous Given 04/05/23 2023)  metoCLOPramide (REGLAN) injection 10 mg (10 mg Intravenous Given 04/05/23 2023)  dicyclomine (BENTYL) injection 20 mg (20 mg Intramuscular Given 04/05/23 2047)  morphine (PF) 4 MG/ML injection 4 mg (4 mg Intravenous Given 04/05/23 2149)        ED Triage Vitals  Encounter Vitals Group     BP 04/05/23 1816 (!) 175/121     Systolic BP Percentile --      Diastolic BP Percentile --      Pulse Rate 04/05/23 1816 97     Resp 04/05/23 1816 20     Temp 04/05/23 1816 97.7 F (36.5 C)     Temp  Source 04/05/23 1816 Oral     SpO2 04/05/23 1816 100 %     Weight --      Height --      Head Circumference --      Peak Flow --      Pain Score 04/05/23 1829 10     Pain Loc --      Pain Education --      Exclude from Growth Chart --   BMWU(13)@     _________________________________________ Significant initial  Findings: Abnormal Labs Reviewed  COMPREHENSIVE METABOLIC PANEL - Abnormal; Notable for the following components:      Result Value   CO2 19 (*)    Glucose, Bld 284 (*)    Creatinine, Ser 1.35 (*)    Calcium 8.1 (*)    Total Protein 5.2 (*)    Albumin <1.5 (*)    GFR, Estimated 54 (*)    All other components within normal limits  LIPASE, BLOOD - Abnormal; Notable for the following components:   Lipase 355 (*)    All other components within normal limits  BETA-HYDROXYBUTYRIC ACID - Abnormal; Notable for the following components:   Beta-Hydroxybutyric Acid 1.43 (*)    All other components within normal limits  BLOOD GAS, VENOUS - Abnormal; Notable for the following components:   pH, Ven 7.53 (*)    pCO2, Ven 23 (*)    pO2, Ven 134 (*)    Bicarbonate 19.2 (*)    All other components within normal limits      _________________________ Troponin ***ordered Cardiac Panel (last 3 results) No results for input(s): "CKTOTAL", "CKMB", "TROPONINIHS", "RELINDX" in the last 72 hours.   ECG: Ordered Personally reviewed and interpreted by me showing: HR : 113 Rhythm: Probable left atrial enlargement Anteroseptal infarct, age indeterminate QTC 476  BNP (last 3 results) Recent Labs    11/30/22 0754 12/01/22 0736 12/02/22 0312  BNP 1,098.0* 1,207.2* 1,252.9*     COVID-19 Labs  No results for input(s): "DDIMER", "FERRITIN", "LDH", "CRP" in the last 72 hours.  Lab Results  Component Value Date   SARSCOV2NAA NEGATIVE 04/04/2023   SARSCOV2NAA NEGATIVE 03/02/2023   SARSCOV2NAA NEGATIVE 10/03/2022   SARSCOV2NAA NEGATIVE 04/10/2022    WBC     Component Value  Date/Time   WBC 8.7 04/04/2023 1705   LYMPHSABS 3.8 03/24/2023 1618   LYMPHSABS 2.9 02/04/2021 1539   MONOABS 1.0 03/24/2023  1618   EOSABS 0.0 03/24/2023 1618   EOSABS 0.1 02/04/2021 1539   BASOSABS 0.0 03/24/2023 1618   BASOSABS 0.0 02/04/2021 1539    Lactic Acid, Venous    Component Value Date/Time   LATICACIDVEN 0.9 03/02/2023 0239       UA  ordered    Results for orders placed or performed during the hospital encounter of 04/04/23  Resp panel by RT-PCR (RSV, Flu A&B, Covid) Anterior Nasal Swab     Status: Abnormal   Collection Time: 04/04/23  5:58 PM   Specimen: Anterior Nasal Swab  Result Value Ref Range Status   SARS Coronavirus 2 by RT PCR NEGATIVE NEGATIVE Final         Influenza A by PCR POSITIVE (A) NEGATIVE Final   Influenza B by PCR NEGATIVE NEGATIVE Final         Resp Syncytial Virus by PCR NEGATIVE NEGATIVE Final           Venous  Blood Gas result:  pH   7.53 High  Acid-base deficit 1.7 mmol/L   pCO2, Ven 23 Low  mmHg O2 Saturation 97.9 %  pO2, Ven 134 High  mmHg      ABG    Component Value Date/Time   PHART 7.362 10/08/2022 2001   PCO2ART 30.0 (L) 10/08/2022 2001   PO2ART 189 (H) 10/08/2022 2001   HCO3 19.2 (L) 04/05/2023 2007   TCO2 18 (L) 10/08/2022 2001   ACIDBASEDEF 1.7 04/05/2023 2007   O2SAT 97.9 04/05/2023 2007     __________________________________________________________ Recent Labs  Lab 04/04/23 1705 04/04/23 1758 04/05/23 2007  NA 137  --  137  K 3.6  --  3.5  CO2 19*  --  19*  GLUCOSE 405*  --  284*  BUN 20  --  19  CREATININE 1.68*  --  1.35*  CALCIUM 8.6*  --  8.1*  MG  --  2.1  --     Cr    stable,   Lab Results  Component Value Date   CREATININE 1.35 (H) 04/05/2023   CREATININE 1.68 (H) 04/04/2023   CREATININE 1.39 (H) 03/28/2023    Recent Labs  Lab 04/04/23 1705 04/05/23 2007  AST 22 16  ALT 26 17  ALKPHOS 146* 106  BILITOT 0.8 0.4  PROT 6.2* 5.2*  ALBUMIN 1.6* <1.5*   Lab Results  Component Value  Date   CALCIUM 8.1 (L) 04/05/2023   PHOS 3.5 01/22/2023    Plt: Lab Results  Component Value Date   PLT 541 (H) 04/04/2023    Recent Labs  Lab 04/04/23 1705  WBC 8.7  HGB 13.0  HCT 41.3  MCV 90.8  PLT 541*    HG/HCT  stable,       Component Value Date/Time   HGB 13.0 04/04/2023 1705   HGB 10.1 (L) 02/04/2021 1539   HCT 41.3 04/04/2023 1705   HCT 30.0 (L) 02/04/2021 1539   MCV 90.8 04/04/2023 1705   MCV 87 02/04/2021 1539    Recent Labs  Lab 04/04/23 1705 04/05/23 2007  LIPASE 30 355*     _______________________________________________ Hospitalist was called for admission for *** Acute pancreatitis, unspecified complication status, unspecified pancreatitis type    The following Work up has been ordered so far:  Orders Placed This Encounter  Procedures   Comprehensive metabolic panel   Lipase, blood   CBC with Diff   Urinalysis, Routine w reflex microscopic -Urine, Clean Catch   hCG, serum, qualitative  Beta-hydroxybutyric acid   Blood gas, venous (at [facility] and [facility])   CBC with Differential/Platelet   Diet NPO time specified   Consult to hospitalist   ED EKG   EKG 12-Lead   Insert peripheral IV     OTHER Significant initial  Findings:  labs showing:     DM  labs:  HbA1C: Recent Labs    06/18/22 0920 11/18/22 0750 03/02/23 0444  HGBA1C 8.8* 5.9* 7.2*    CBG (last 3)  Recent Labs    04/04/23 1657 04/04/23 2116  GLUCAP 373* 242*    Cultures:    Component Value Date/Time   SDES  03/02/2023 1828    BLOOD RIGHT HAND Performed at [facility], [address], [place], [place] [address]    SPECREQUEST  03/02/2023 1828    BOTTLES DRAWN AEROBIC ONLY Blood Culture results may not be optimal due to an inadequate volume of blood received in culture bottles Performed at [facility], [address], [place], [place] [address]    CULT (A) 03/02/2023 1828    ACTINOMYCES SPECIES Standardized susceptibility testing for this  organism is not available. Performed at [facility], [address] [place], [place] [address]    REPTSTATUS 03/06/2023 FINAL 03/02/2023 1828     Radiological Exams on Admission: No results found. _______________________________________________________________________________________________________ Latest  Blood pressure (!) 189/123, pulse (!) 113, temperature 97.8 F (36.6 C), temperature source Oral, resp. rate 11, SpO2 100%.   Vitals  labs and radiology finding personally reviewed  Review of Systems:    Pertinent positives include:  chills, fatigue,  abdominal pain, nausea, vomiting, Constitutional:  No weight loss, night sweats, Fevers,  weight loss  HEENT:  No headaches, Difficulty swallowing,Tooth/dental problems,Sore throat,  No sneezing, itching, ear ache, nasal congestion, post nasal drip,  Cardio-vascular:  No chest pain, Orthopnea, PND, anasarca, dizziness, palpitations.no Bilateral lower extremity swelling  GI:  No heartburn, indigestion, diarrhea, change in bowel habits, loss of appetite, melena, blood in stool, hematemesis Resp:  no shortness of breath at rest. No dyspnea on exertion, No excess mucus, no productive cough, No non-productive cough, No coughing up of blood.No change in color of mucus.No wheezing. Skin:  no rash or lesions. No jaundice GU:  no dysuria, change in color of urine, no urgency or frequency. No straining to urinate.  No flank pain.  Musculoskeletal:  No joint pain or no joint swelling. No decreased range of motion. No back pain.  Psych:  No change in mood or affect. No depression or anxiety. No memory loss.  Neuro: no localizing neurological complaints, no tingling, no weakness, no double vision, no gait abnormality, no slurred speech, no confusion  All systems reviewed and apart from HOPI all are negative _______________________________________________________________________________________________ Past Medical History:    Past Medical History:  Diagnosis Date   Acute H. pylori gastric ulcer    Coffee ground emesis    Diabetes mellitus (HCC)    Diabetic gastroparesis (HCC)    DKA (diabetic ketoacidosis) (HCC) 02/24/2021   Gastroparesis    GERD (gastroesophageal reflux disease)    Hypertension    Hyperthyroidism    Intractable nausea and vomiting 04/20/2021   Normocytic anemia 04/20/2020   Prolonged Q-T interval on ECG      Past Surgical History:  Procedure Laterality Date   AMPUTATION Bilateral 10/24/2022   Procedure: BILATERAL BELOW KNEE AMPUTATION;  Surgeon: Nadara Mustard, MD;  Location: [facility] OR;  Service: Orthopedics;  Laterality: Bilateral;  AMPUTATION Bilateral 10/08/2022   Procedure: BILATERAL LEG DEBRIDEMENT;  Surgeon: Nadara Mustard, MD;  Location: [facility] OR;  Service: Orthopedics;  Laterality: Bilateral;   AMPUTATION Bilateral 11/14/2022   Procedure: BILATERAL ABOVE KNEE AMPUTATION;  Surgeon: Nadara Mustard, MD;  Location: [facility] OR;  Service: Orthopedics;  Laterality: Bilateral;   AMPUTATION TOE Left 03/10/2018   Procedure: AMPUTATION FIFTH TOE;  Surgeon: Vivi Barrack, DPM;  Location: [facility];  Service: Podiatry;  Laterality: Left;   APPLICATION OF WOUND VAC Bilateral 10/12/2022   Procedure: APPLICATION OF WOUND VAC BILATERAL LEGS;  Surgeon: Myrene Galas, MD;  Location: [facility] OR;  Service: Orthopedics;  Laterality: Bilateral;   APPLICATION OF WOUND VAC Bilateral 10/24/2022   Procedure: APPLICATION OF WOUND VAC;  Surgeon: Nadara Mustard, MD;  Location: [facility] OR;  Service: Orthopedics;  Laterality: Bilateral;   BIOPSY  01/28/2020   Procedure: BIOPSY;  Surgeon: Kathi Der, MD;  Location: [facility];  Service: Gastroenterology;;   BOTOX INJECTION  08/26/2021   Procedure: BOTOX INJECTION;  Surgeon: Sherrilyn Rist, MD;  Location: [facility];  Service: Gastroenterology;;   ESOPHAGOGASTRODUODENOSCOPY N/A 01/28/2020   Procedure: ESOPHAGOGASTRODUODENOSCOPY (EGD);  Surgeon:  Kathi Der, MD;  Location: [facility];  Service: Gastroenterology;  Laterality: N/A;   ESOPHAGOGASTRODUODENOSCOPY (EGD) WITH PROPOFOL Left 09/08/2015   Procedure: ESOPHAGOGASTRODUODENOSCOPY (EGD) WITH PROPOFOL;  Surgeon: Willis Modena, MD;  Location: [facility] END[facility]COPY;  Service: Endoscopy;  Laterality: Left;   ESOPHAGOGASTRODUODENOSCOPY (EGD) WITH PROPOFOL N/A 08/26/2021   Procedure: ESOPHAGOGASTRODUODENOSCOPY (EGD) WITH PROPOFOL;  Surgeon: Sherrilyn Rist, MD;  Location: [facility];  Service: Gastroenterology;  Laterality: N/A;   GASTRIC STIMULATOR IMPLANT SURGERY  06/2022   at [facility]   I & D EXTREMITY Bilateral 10/12/2022   Procedure: IRRIGATION AND  DEBRIDEMENT  BILATERAL LEGS;  Surgeon: Myrene Galas, MD;  Location: [facility] OR;  Service: Orthopedics;  Laterality: Bilateral;   I & D EXTREMITY Right 10/13/2022   Procedure: RIGHT THIGH WOUND VAC EXCHANGE;  Surgeon: Roby Lofts, MD;  Location: [facility] OR;  Service: Orthopedics;  Laterality: Right;   I & D EXTREMITY Bilateral 10/17/2022   Procedure: BILATERAL THIGH AND LEG DEBRIDEMENT, PARTIAL BILATERAL FOOT AMPUTATION;  Surgeon: Nadara Mustard, MD;  Location: [facility] OR;  Service: Orthopedics;  Laterality: Bilateral;   I & D EXTREMITY Bilateral 11/05/2022   Procedure: BILATERAL THIGH DEBRIDEMENT;  Surgeon: Nadara Mustard, MD;  Location: [facility] OR;  Service: Orthopedics;  Laterality: Bilateral;   I & D EXTREMITY Bilateral 11/07/2022   Procedure: BILATERAL THIGH AND LEG DEBRIDEMENT;  Surgeon: Nadara Mustard, MD;  Location[facility] OR;  Service: Orthopedics;  Laterality: Bilateral;   I & D EXTREMITY Bilateral 11/12/2022   Procedure: BILATERAL LEG DEBRIDEMENTS;  Surgeon: Nadara Mustard, MD;  Location: [facility] OR;  Service: Orthopedics;  Laterality: Bilateral;   IRRIGATION AND DEBRIDEMENT FOOT Bilateral 10/05/2022   Procedure: IRRIGATION AND DEBRIDEMENT FOOT;  Surgeon: [name], DPM;  Location: [facility];  Service: Orthopedics/Podiatry;  Laterality: Bilateral;   WISDOM  TOOTH EXTRACTION      Social History:  Ambulatory    wheelchair bound,     reports that she has never smoked. She has never used smokeless tobacco. She reports current alcohol use of about 1.0 standard drink of alcohol per week. She reports that she does not use drugs.   Family History:  Family History  Problem Relation Age of Onset   Lung cancer Mother    Diabetes Mother  Bipolar disorder Father    Liver cancer Maternal Grandfather    Diabetes Maternal Aunt        x 2   Pancreatic cancer Maternal Uncle    Prostate cancer Maternal Uncle    Breast cancer Other        maternal great aunt   Heart disease Other    Ovarian cancer Other        maternal great aunt   Kidney disease Other        maternal great aunt   Colon cancer Neg Hx    Stomach cancer Neg Hx    ______________________________________________________________________________________________ Allergies: Allergies  Allergen Reactions   Ibuprofen Other (See Comments)    07/23/22 - Worsening kidney function after 3 doses of ibuprofen 400mg .  Creatinine increased from 1.3 to 2.  Creatinine returned to normal after stopping.   Lisinopril Cough, Other (See Comments), Itching and Rash    Breakout in rash     Prior to Admission medications   Medication Sig Start Date End Date Taking? Authorizing Provider  acetaminophen (TYLENOL) 500 MG tablet Take 1 tablet (500 mg total) by mouth every 6 (six) hours as needed. 04/04/23   Fayrene Helper, PA-C  amLODipine (NORVASC) 10 MG tablet Take 1 tablet (10 mg total) by mouth daily. 12/04/22   Elgergawy, Leana Roe, MD  ascorbic acid (VITAMIN C) 1000 MG tablet Take 1 tablet (1,000 mg total) by mouth daily. Patient taking differently: Take 1,000 mg by mouth once a week. 11/22/22   Azucena Fallen, MD  benzonatate (TESSALON) 100 MG capsule Take 1 capsule (100 mg total) by mouth every 8 (eight) hours. 04/04/23   Fayrene Helper, PA-C  carvedilol (COREG) 6.25 MG tablet Take 1 tablet (6.25 mg  total) by mouth 2 (two) times daily with a meal. 12/03/22   Elgergawy, Leana Roe, MD  Continuous Glucose Transmitter (DEXCOM G6 TRANSMITTER) MISC 1 Device by Does not apply route as directed. 06/18/22   Shamleffer, Konrad Dolores, MD  etonogestrel (NEXPLANON) 68 MG IMPL implant 1 each by Subdermal route continuous.    [provider]  famotidine (PEPCID) 20 MG tablet Take 1 tablet (20 mg total) by mouth 2 (two) times daily. 12/03/22   Elgergawy, Leana Roe, MD  feeding supplement, GLUCERNA SHAKE, (GLUCERNA SHAKE) LIQD Take 237 mLs by mouth 3 (three) times daily between meals. 11/22/22   Azucena Fallen, MD  gabapentin (NEURONTIN) 300 MG capsule Take 1 capsule (300 mg total) by mouth every 8 (eight) hours. 11/22/22   Azucena Fallen, MD  hydrOXYzine (ATARAX) 10 MG tablet Take 1 tablet (10 mg total) by mouth 3 (three) times daily as needed for anxiety. 03/07/23   Osvaldo Shipper, MD  insulin aspart (NOVOLOG) 100 UNIT/ML injection Inject 0-9 Units into the skin 3 (three) times daily with meals. Patient taking differently: Inject 10-30 Units into the skin 3 (three) times daily with meals. 12/03/22   Elgergawy, Leana Roe, MD  Insulin Pen Needle 32G X 4 MM MISC 1 Device by Does not apply route in the morning, at noon, in the evening, and at bedtime. 06/18/22   Shamleffer, Konrad Dolores, MD  MAGNESIUM PO Take 1 tablet by mouth daily.    [provider]  methocarbamol (ROBAXIN) 500 MG tablet Take 500 mg by mouth 3 (three) times daily as needed for muscle spasms. 11/22/22   [provider]  metoCLOPramide (REGLAN) 10 MG tablet Take 1 tablet (10 mg total) by mouth every 6 (six) hours. 03/23/23  Arletha Pili, DO  Multiple Vitamin (MULTIVITAMIN WITH MINERALS) TABS tablet Take 1 tablet by mouth daily. 11/22/22   Azucena Fallen, MD  oseltamivir (TAMIFLU) 75 MG capsule Take 1 capsule (75 mg total) by mouth every 12 (twelve) hours. 04/04/23   Fayrene Helper, PA-C  oxyCODONE (OXY  IR/ROXICODONE) 5 MG immediate release tablet Take 1 tablet (5 mg total) by mouth every 6 (six) hours as needed for severe pain (pain score 7-10). 03/07/23   Osvaldo Shipper, MD  sucralfate (CARAFATE) 1 g tablet Take 1 g by mouth in the morning, at noon, and at bedtime. 08/28/22   [provider]  traZODone (DESYREL) 150 MG tablet Take 150 mg by mouth at bedtime. 01/14/23   [provider]    ___________________________________________________________________________________________________ Physical Exam:    04/05/2023    8:16 PM 04/05/2023    6:16 PM 04/05/2023   12:17 AM  Vitals with BMI  Systolic 189 175 161  Diastolic 123 121 93  Pulse 113 97 110     1. General:  in No  Acute distress   Chronically ill   -appearing 2. Psychological: Alert and  Oriented 3. Head/ENT:     Dry Mucous Membranes                          Head Non traumatic, neck supple                          Poor Dentition 4. SKIN:  decreased Skin turgor,  Skin clean Dry and intact no rash    5. Heart: Regular rate and rhythm no*** Murmur, no Rub or gallop 6. Lungs: ***Clear to auscultation bilaterally, no wheezes or crackles   7. Abdomen: Soft, ***non-tender, Non distended *** obese ***bowel sounds present 8. Lower extremities: no clubbing, cyanosis, no ***edema 9. Neurologically Grossly intact, moving all 4 extremities equally *** strength 5 out of 5 in all 4 extremities cranial nerves II through XII intact 10. MSK: Normal range of motion    Chart has been reviewed  ______________________________________________________________________________________________  Assessment/Plan 33 y.o. female with medical history significant of  HTN, DM1, gastroparesis, GERD prolonged QT necrotizing fasciitis C. difficile colitis bilateral AKA   Admitted for *** Acute pancreatitis, unspecified complication status, unspecified pancreatitis type ***    Present on Admission: **None**     No problem-specific  Assessment & Plan notes found for this encounter.    Other plan as per orders.  DVT prophylaxis:  SCD *** Lovenox       Code Status:    Code Status: Prior FULL CODE *** DNR/DNI ***comfort care as per patient ***family  I had personally discussed CODE STATUS with patient and family*  ACP *** none has been reviewed ***   Family Communication:   Family not at  Bedside  plan of care was discussed on the phone with *** Son, Daughter, Wife, Husband, Sister, Brother , father, mother  Diet  Diet Orders (From admission, onward)     Start     Ordered   04/05/23 1819  Diet NPO time specified  Diet effective now        04/05/23 1819            Disposition Plan:   *** likely will need placement for rehabilitation  Back to current facility when stable                            To home once workup is complete and patient is stable  ***Following barriers for discharge:                             Chest pain *** Stroke *** work up is complete                            Electrolytes corrected                               Anemia corrected h/H stable                             Pain controlled with PO medications                               Afebrile, white count improving able to transition to PO antibiotics                             Will need to be able to tolerate PO                            Will likely need home health, home O2, set up                           Will need consultants to evaluate patient prior to discharge       Consult Orders  (From admission, onward)           Start     Ordered   04/05/23 2120  Consult to hospitalist  Once       Provider:  (Not yet assigned)  Question Answer Comment  Place call to: [facility]   Reason for Consult Admit      04/05/23 2119                              ***Would benefit from PT/OT eval prior to DC  Ordered                   Swallow eval - SLP ordered                   Diabetes  care coordinator                   Transition of care consulted                   Nutrition    consulted                  Wound care  consulted                   Palliative care    consulted                   Behavioral health  consulted  Consults called: ***     Admission status:  ED Disposition     ED Disposition  Admit   Condition  --   Comment  The patient appears reasonably stabilized for admission considering the current resources, flow, and capabilities available in the ED at this time, and I doubt any other [facility] requiring further screening and/or treatment in the ED prior to admission is  present.           Obs***  ***  inpatient     I Expect 2 midnight stay secondary to severity of patient's current illness need for inpatient interventions justified by the following: ***hemodynamic instability despite optimal treatment (tachycardia *hypotension * tachypnea *hypoxia, hypercapnia) * Severe lab/radiological/exam abnormalities including:     and extensive comorbidities including: *substance abuse  *Chronic pain *DM2  * CHF * CAD  * COPD/asthma *Morbid Obesity * CKD *dementia *liver disease *history of stroke with residual deficits *  malignancy, * sickle cell disease  History of amputation Chronic anticoagulation  That are currently affecting medical management.   I expect  patient to be hospitalized for 2 midnights requiring inpatient medical care.  Patient is at high risk for adverse outcome (such as loss of life or disability) if not treated.  Indication for inpatient stay as follows:  Severe change from baseline regarding mental status Hemodynamic instability despite maximal medical therapy,  ongoing suicidal ideations,  severe pain requiring acute inpatient management,  inability to maintain oral hydration   persistent chest pain despite medical management Need for operative/procedural  intervention New or worsening  hypoxia   Need for IV antibiotics, IV fluids, IV rate controling medications, IV antihypertensives, IV pain medications, IV anticoagulation, need for biPAP    Level of care   *** tele  For 12H 24H     medical floor       progressive     stepdown   tele indefinitely please discontinue once patient no longer qualifies COVID-19 Labs    Lab Results  Component Value Date   SARSCOV2NAA NEGATIVE 04/04/2023     Precautions: admitted as *** Covid Negative  ***asymptomatic screening protocol****PUI *** covid positive No active isolations ***If Covid PCR is negative  - please DC precautions - would need additional investigation given very high risk for false native test result    Critical***  Patient is critically ill due to  hemodynamic instability * respiratory failure *severe sepsis* ongoing chest pain*  They are at high risk for life/limb threatening clinical deterioration requiring frequent reassessment and modifications of care.  Services provided include examination of the patient, review of relevant ancillary tests, prescription of lifesaving therapies, review of medications and prophylactic therapy.  Total critical care time excluding separately billable procedures: 60*  Minutes.    Therisa Doyne 04/05/2023, 9:51 PM ***  [facility]     after 2 AM please page floor coverage PA If 7AM-7PM, please contact the day team taking care of the patient using [email or web address]

## 2023-04-05 NOTE — ED Triage Notes (Signed)
BIB [facility] from home for NV, and abd pain x1d, r/t flare of her gastroparesis. Here yesterday for the same and d/c'd. VSS. CBG 312. H/o type 1DM. Zofran 4mg  given IM. 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 Alert, NAD, calm, interactive, resps e/u.

## 2023-04-05 NOTE — Subjective & Objective (Signed)
Pt comes in from home with nausea vomiting abdominal pain History of gastroparesis feels that this is a flare She was seen yesterday for the same and was able to be discharged to home Patient is diabetic CBG 312 Type 1 diabetes Status post bilateral AKA Reports very numerous episodes of nausea vomiting too numerous to count Patient has been requesting for Dilaudid for abdominal pain no improvement since yesterday

## 2023-04-06 ENCOUNTER — Encounter (HOSPITAL_COMMUNITY): Payer: Self-pay | Admitting: Internal Medicine

## 2023-04-06 DIAGNOSIS — I129 Hypertensive chronic kidney disease with stage 1 through stage 4 chronic kidney disease, or unspecified chronic kidney disease: Secondary | ICD-10-CM | POA: Diagnosis present

## 2023-04-06 DIAGNOSIS — Z1152 Encounter for screening for COVID-19: Secondary | ICD-10-CM | POA: Diagnosis not present

## 2023-04-06 DIAGNOSIS — F32A Depression, unspecified: Secondary | ICD-10-CM | POA: Diagnosis present

## 2023-04-06 DIAGNOSIS — E039 Hypothyroidism, unspecified: Secondary | ICD-10-CM | POA: Diagnosis present

## 2023-04-06 DIAGNOSIS — Z993 Dependence on wheelchair: Secondary | ICD-10-CM | POA: Diagnosis not present

## 2023-04-06 DIAGNOSIS — N1831 Chronic kidney disease, stage 3a: Secondary | ICD-10-CM | POA: Diagnosis present

## 2023-04-06 DIAGNOSIS — Z794 Long term (current) use of insulin: Secondary | ICD-10-CM | POA: Diagnosis not present

## 2023-04-06 DIAGNOSIS — Z818 Family history of other mental and behavioral disorders: Secondary | ICD-10-CM | POA: Diagnosis not present

## 2023-04-06 DIAGNOSIS — F419 Anxiety disorder, unspecified: Secondary | ICD-10-CM | POA: Diagnosis present

## 2023-04-06 DIAGNOSIS — Z8619 Personal history of other infectious and parasitic diseases: Secondary | ICD-10-CM | POA: Diagnosis not present

## 2023-04-06 DIAGNOSIS — G894 Chronic pain syndrome: Secondary | ICD-10-CM | POA: Diagnosis present

## 2023-04-06 DIAGNOSIS — K219 Gastro-esophageal reflux disease without esophagitis: Secondary | ICD-10-CM | POA: Diagnosis present

## 2023-04-06 DIAGNOSIS — G4700 Insomnia, unspecified: Secondary | ICD-10-CM | POA: Diagnosis present

## 2023-04-06 DIAGNOSIS — K859 Acute pancreatitis without necrosis or infection, unspecified: Secondary | ICD-10-CM | POA: Diagnosis present

## 2023-04-06 DIAGNOSIS — Z833 Family history of diabetes mellitus: Secondary | ICD-10-CM | POA: Diagnosis not present

## 2023-04-06 DIAGNOSIS — Z89611 Acquired absence of right leg above knee: Secondary | ICD-10-CM | POA: Diagnosis not present

## 2023-04-06 DIAGNOSIS — J101 Influenza due to other identified influenza virus with other respiratory manifestations: Secondary | ICD-10-CM | POA: Diagnosis present

## 2023-04-06 DIAGNOSIS — E1022 Type 1 diabetes mellitus with diabetic chronic kidney disease: Secondary | ICD-10-CM | POA: Diagnosis present

## 2023-04-06 DIAGNOSIS — Z79899 Other long term (current) drug therapy: Secondary | ICD-10-CM | POA: Diagnosis not present

## 2023-04-06 DIAGNOSIS — E1065 Type 1 diabetes mellitus with hyperglycemia: Secondary | ICD-10-CM | POA: Diagnosis present

## 2023-04-06 DIAGNOSIS — E1043 Type 1 diabetes mellitus with diabetic autonomic (poly)neuropathy: Secondary | ICD-10-CM | POA: Diagnosis present

## 2023-04-06 DIAGNOSIS — K3184 Gastroparesis: Secondary | ICD-10-CM | POA: Diagnosis present

## 2023-04-06 DIAGNOSIS — Z89612 Acquired absence of left leg above knee: Secondary | ICD-10-CM | POA: Diagnosis not present

## 2023-04-06 DIAGNOSIS — E876 Hypokalemia: Secondary | ICD-10-CM | POA: Diagnosis present

## 2023-04-06 LAB — COMPREHENSIVE METABOLIC PANEL
ALT: 16 U/L (ref 0–44)
AST: 16 U/L (ref 15–41)
Albumin: <1.5 g/dL — ABNORMAL LOW (ref 3.5–5.0)
Alkaline Phosphatase: 101 U/L (ref 38–126)
Anion gap: 8 (ref 5–15)
BUN: 20 mg/dL (ref 6–20)
CO2: 20 mmol/L — ABNORMAL LOW (ref 22–32)
Calcium: 7.9 mg/dL — ABNORMAL LOW (ref 8.9–10.3)
Chloride: 111 mmol/L (ref 98–111)
Creatinine, Ser: 1.41 mg/dL — ABNORMAL HIGH (ref 0.44–1.00)
GFR, Estimated: 51 mL/min — ABNORMAL LOW (ref >60)
Glucose, Bld: 142 mg/dL — ABNORMAL HIGH (ref 70–99)
Potassium: 3.1 mmol/L — ABNORMAL LOW (ref 3.5–5.1)
Sodium: 139 mmol/L (ref 135–145)
Total Bilirubin: 0.5 mg/dL (ref 0.0–1.2)
Total Protein: 5 g/dL — ABNORMAL LOW (ref 6.5–8.1)

## 2023-04-06 LAB — URINALYSIS, ROUTINE W REFLEX MICROSCOPIC
Bilirubin Urine: NEGATIVE
Glucose, UA: >=500 mg/dL — AB
Ketones, ur: 5 mg/dL — AB
Leukocytes,Ua: NEGATIVE
Nitrite: NEGATIVE
Protein, ur: >=300 mg/dL — AB
Specific Gravity, Urine: >1.046 — ABNORMAL HIGH (ref 1.005–1.030)
pH: 5 (ref 5.0–8.0)

## 2023-04-06 LAB — CBC
HCT: 31.4 % — ABNORMAL LOW (ref 36.0–46.0)
Hemoglobin: 9.7 g/dL — ABNORMAL LOW (ref 12.0–15.0)
MCH: 28.4 pg (ref 26.0–34.0)
MCHC: 30.9 g/dL (ref 30.0–36.0)
MCV: 92.1 fL (ref 80.0–100.0)
Platelets: 492 10*3/uL — ABNORMAL HIGH (ref 150–400)
RBC: 3.41 MIL/uL — ABNORMAL LOW (ref 3.87–5.11)
RDW: 15.1 % (ref 11.5–15.5)
WBC: 14.5 10*3/uL — ABNORMAL HIGH (ref 4.0–10.5)
nRBC: 0 % (ref 0.0–0.2)

## 2023-04-06 LAB — GLUCOSE, CAPILLARY
Glucose-Capillary: 105 mg/dL — ABNORMAL HIGH (ref 70–99)
Glucose-Capillary: 115 mg/dL — ABNORMAL HIGH (ref 70–99)
Glucose-Capillary: 188 mg/dL — ABNORMAL HIGH (ref 70–99)

## 2023-04-06 LAB — LACTIC ACID, PLASMA: Lactic Acid, Venous: 1.1 mmol/L (ref 0.5–1.9)

## 2023-04-06 LAB — CBG MONITORING, ED
Glucose-Capillary: 127 mg/dL — ABNORMAL HIGH (ref 70–99)
Glucose-Capillary: 228 mg/dL — ABNORMAL HIGH (ref 70–99)
Glucose-Capillary: 93 mg/dL (ref 70–99)

## 2023-04-06 LAB — PHOSPHORUS: Phosphorus: 3.4 mg/dL (ref 2.5–4.6)

## 2023-04-06 LAB — MAGNESIUM: Magnesium: 1.6 mg/dL — ABNORMAL LOW (ref 1.7–2.4)

## 2023-04-06 LAB — LIPASE, BLOOD: Lipase: 516 U/L — ABNORMAL HIGH (ref 11–51)

## 2023-04-06 MED ORDER — OXYCODONE HCL 5 MG PO TABS
5.0000 mg | ORAL_TABLET | Freq: Four times a day (QID) | ORAL | Status: DC | PRN
  Administered 2023-04-06: 5 mg via ORAL
  Filled 2023-04-06: qty 1

## 2023-04-06 MED ORDER — ACETAMINOPHEN 650 MG RE SUPP: 650.0000 mg | Freq: Four times a day (QID) | RECTAL | Status: DC | PRN

## 2023-04-06 MED ORDER — HYDROMORPHONE HCL 1 MG/ML IJ SOLN
0.5000 mg | INTRAMUSCULAR | Status: DC | PRN
  Administered 2023-04-06 (×3): 0.5 mg via INTRAVENOUS
  Filled 2023-04-06 (×3): qty 1

## 2023-04-06 MED ORDER — TRAZODONE HCL 50 MG PO TABS
150.0000 mg | ORAL_TABLET | Freq: Every day | ORAL | Status: DC
  Administered 2023-04-06: 150 mg via ORAL
  Filled 2023-04-06: qty 1

## 2023-04-06 MED ORDER — ONDANSETRON HCL 4 MG/2ML IJ SOLN
4.0000 mg | Freq: Four times a day (QID) | INTRAMUSCULAR | Status: DC | PRN
Start: 2023-04-06 — End: 2023-04-07
  Administered 2023-04-06: 4 mg via INTRAVENOUS
  Filled 2023-04-06: qty 2

## 2023-04-06 MED ORDER — OXYCODONE HCL 5 MG PO TABS
5.0000 mg | ORAL_TABLET | ORAL | Status: DC | PRN
  Filled 2023-04-06: qty 1

## 2023-04-06 MED ORDER — INSULIN ASPART 100 UNIT/ML IJ SOLN
0.0000 [IU] | Freq: Every day | INTRAMUSCULAR | Status: DC
Start: 2023-04-06 — End: 2023-04-07

## 2023-04-06 MED ORDER — INSULIN GLARGINE-YFGN 100 UNIT/ML ~~LOC~~ SOLN
5.0000 [IU] | Freq: Every day | SUBCUTANEOUS | Status: DC
  Administered 2023-04-06 (×2): 5 [IU] via SUBCUTANEOUS
  Filled 2023-04-06 (×3): qty 0.05

## 2023-04-06 MED ORDER — POTASSIUM CHLORIDE 10 MEQ/100ML IV SOLN
10.0000 meq | INTRAVENOUS | Status: AC
  Administered 2023-04-06 (×6): 10 meq via INTRAVENOUS
  Filled 2023-04-06 (×6): qty 100

## 2023-04-06 MED ORDER — GABAPENTIN 300 MG PO CAPS
300.0000 mg | ORAL_CAPSULE | Freq: Three times a day (TID) | ORAL | Status: DC
  Administered 2023-04-06 – 2023-04-07 (×4): 300 mg via ORAL
  Filled 2023-04-06 (×4): qty 1

## 2023-04-06 MED ORDER — HYDRALAZINE HCL 20 MG/ML IJ SOLN: 10.0000 mg | Freq: Four times a day (QID) | INTRAMUSCULAR | Status: DC | PRN

## 2023-04-06 MED ORDER — HYDROMORPHONE HCL 1 MG/ML IJ SOLN
0.5000 mg | INTRAMUSCULAR | Status: DC | PRN
  Administered 2023-04-06 (×3): 1 mg via INTRAVENOUS
  Filled 2023-04-06 (×3): qty 1

## 2023-04-06 MED ORDER — OSELTAMIVIR PHOSPHATE 75 MG PO CAPS: 75.0000 mg | ORAL_CAPSULE | Freq: Two times a day (BID) | ORAL | Status: DC

## 2023-04-06 MED ORDER — ACETAMINOPHEN 325 MG PO TABS: 650.0000 mg | ORAL_TABLET | Freq: Four times a day (QID) | ORAL | Status: DC | PRN

## 2023-04-06 MED ORDER — INSULIN ASPART 100 UNIT/ML IJ SOLN: 0.0000 [IU] | Freq: Three times a day (TID) | INTRAMUSCULAR | Status: DC

## 2023-04-06 MED ORDER — SODIUM CHLORIDE 0.9 % IV SOLN: INTRAVENOUS | Status: DC

## 2023-04-06 MED ORDER — HYDROMORPHONE HCL 1 MG/ML IJ SOLN
0.5000 mg | INTRAMUSCULAR | Status: DC | PRN
  Administered 2023-04-06 – 2023-04-07 (×5): 1 mg via INTRAVENOUS
  Filled 2023-04-06 (×5): qty 1

## 2023-04-06 MED ORDER — DIPHENHYDRAMINE HCL 25 MG PO CAPS
25.0000 mg | ORAL_CAPSULE | Freq: Four times a day (QID) | ORAL | Status: DC | PRN
  Administered 2023-04-06: 25 mg via ORAL
  Filled 2023-04-06: qty 1

## 2023-04-06 MED ORDER — FENTANYL CITRATE PF 50 MCG/ML IJ SOSY
50.0000 ug | PREFILLED_SYRINGE | INTRAMUSCULAR | Status: DC | PRN
  Administered 2023-04-06: 50 ug via INTRAVENOUS
  Filled 2023-04-06: qty 1

## 2023-04-06 MED ORDER — CARVEDILOL 6.25 MG PO TABS
6.2500 mg | ORAL_TABLET | Freq: Two times a day (BID) | ORAL | Status: DC
  Administered 2023-04-06 – 2023-04-07 (×2): 6.25 mg via ORAL
  Filled 2023-04-06 (×2): qty 1

## 2023-04-06 MED ORDER — OSELTAMIVIR PHOSPHATE 30 MG PO CAPS
30.0000 mg | ORAL_CAPSULE | Freq: Two times a day (BID) | ORAL | Status: DC
  Administered 2023-04-06 – 2023-04-07 (×3): 30 mg via ORAL
  Filled 2023-04-06 (×3): qty 1

## 2023-04-06 MED ORDER — MAGNESIUM SULFATE 2 GM/50ML IV SOLN
2.0000 g | Freq: Once | INTRAVENOUS | Status: AC
  Administered 2023-04-06: 2 g via INTRAVENOUS
  Filled 2023-04-06: qty 50

## 2023-04-06 MED ORDER — ORAL CARE MOUTH RINSE: 15.0000 mL | OROMUCOSAL | Status: DC | PRN

## 2023-04-06 MED ORDER — HYDROXYZINE HCL 10 MG PO TABS: 10.0000 mg | ORAL_TABLET | Freq: Three times a day (TID) | ORAL | Status: DC | PRN

## 2023-04-06 NOTE — Progress Notes (Addendum)
PROGRESS NOTE    Kelli Hudson  ZOX:[id number] DOB: 1990/11/13 DOA: 04/05/2023 PCP: Cristino Martes, NP    Brief Narrative:   Kelli Hudson is a 33 y.o. female with past medical history significant for HTN, DM 1, diabetic gastroparesis s/p gastric stimulator, CKD stage II-3A, retinopathy, neuropathy, history of necrotizing fasciitis s/p bilateral AKA, chronic pain syndrome, history of C. difficile colitis; recent diagnosis of influenza A same day who presented to [facility] ED on 2/15 complaining of abdominal pain, nausea and vomiting, abdominal pain; poor oral intake.  Reports compliance with her home medications.  No sick contacts.  Denies headache, no chest pain, no palpitations, no shortness of breath, no fever/chills, no cough/congestion.  In the ED, temperature 97.7 F, HR 113, RR 21, BP 175/121, SpO2 100% on room air.  WBC 14.4, hemoglobin 10.1, platelet count 546.  Sodium 137, potassium 3.5, chloride 108, CO2 19, glucose 284, BUN 19, creat 1.35.  AST 16, ALT 17, total bilirubin 0.4.  Anion gap 10.  Lipase 355.  Lactic acid 1.8.  CK 143, high-sensitivity troponin 8.  VBG with pH 7.53, pCO2 23, pO2 134.  hCG negative.  Beta-hydroxybutyrate acid 1.43.  TSH 2.756.  Urinalysis with negative nitrite, negative leukocytes, rare bacteria, 11-20 WBCs, 5 ketones, greater than 500 glucose.  Chest x-ray with no acute cardiopulmonary disease process.  CT abdomen/pelvis with contrast with mild edema surrounding pancreatic tail/body compatible with acute pancreatitis; no complicating features.  Assessment & Plan:   Acute pancreatitis History of gastroparesis with intractable nausea and vomiting Patient with frequent admissions for gastroparesis with nausea and vomiting.  Had gastric stimulator placed in May 2024 at [facility].  Patient complaining of abdominal pain, nausea and vomiting.  Patient is afebrile, WBC count slightly elevated 14.4 likely reactive.   Lactic acid within normal limits.  Lipase elevated 355.  CT abdomen/pelvis with findings consistent with acute pancreatitis. -- Advance to clear liquid diet today -- Continue IVF w/ NS at 150 mL/h -- Oxycodone 5 mg p.o. every 6 hours as needed severe pain -- Dilaudid 0.5-1 mg IV every 2 hours as needed severe pain not relieved with oxycodone -- Reglan 10 mg IV every 8 hours scheduled -- Zofran 4 mg IV every 6 hours as needed nausea/vomiting -- BMP, lipase daily  Influenza A viral infection Diagnosed 2/15 with positive influenza A PCR.  On room air.  Chest x-ray with no acute cardiopulmonary disease process. -- Tamiflu 30 mg PO BID x 5 days -- Droplet precaution  Type 1 diabetes mellitus Hemoglobin A1c hemoglobin A1c 7.2 on 03/02/2023, fairly well-controlled.  Home regimen includes Tresiba 15 units subcutaneously twice daily if CBG greater than 150; NovoLog SSI. -- Semglee 5 units [place] at bedtime -- Sensitive SSI for coverage -- CBG before every meal/at bedtime  CKD stage II-3A -- Cr 1.35>1.41 -- continue IVF w/ NS at 150 mL/h -- Avoid nephrotoxins, renal dose all medications -- BMP daily  Hypokalemia Potassium 3.1 this morning, will replete. -- Repeat electrolytes in a.m. to Glu magnesium  Essential hypertension -- Restart carvedilol 6.25 mg p.o. twice daily -- Hold home amlodipine and spironolactone for now -- Hydralazine 10 mg IV q6h PRN SBP >165 -- Continue to monitor BP closely  Chronic pain syndrome -- Gabapentin 300 mg p.o. every 8 hours  Anxiety/depression -- Atarax 10mg  PO TID PRN anxiety  Insomnia -- Trazodone 150 mg p.o. nightly  S/p bilateral AKA --Supportive care   DVT prophylaxis: SCDs Start: 04/06/23 8119  Code Status: Full Code Family Communication: No family present at bedside this morning  Disposition Plan:  Level of care: Progressive Status is: Inpatient Remains inpatient appropriate because: Needs further advancement of diet     Consultants:  None  Procedures:  None  Antimicrobials:  None   Subjective: Patient seen examined bedside, remains in ED holding area.  Continues with abdominal pain, actively vomiting in my presence.  Remains on IV fluids.  Discussed CT findings concerning for acute pancreatitis.  Complicated by her underlying gastroparesis as well.  Asking for pain medication.  No other specific complaints, concerns or questions at this time.  Denies headache, no dizziness, no chest pain, no palpitations, no diarrhea.  No acute events overnight per nursing.  Later this morning, patient's symptoms improving, requesting advancement of her diet.  Discussed with RN will start clear liquid diet.  Objective: Vitals:   04/06/23 0821 04/06/23 0916 04/06/23 1132 04/06/23 1209  BP:  (!) 163/110 (!) 147/101 (!) 138/99  Pulse:  (!) 101 94 92  Resp:  (!) 24 16 16   Temp: 98.2 F (36.8 C) (!) 97.4 F (36.3 C) 97.7 F (36.5 C) 97.9 F (36.6 C)  TempSrc: Oral Oral Oral Oral  SpO2:  100% 100% 100%  Weight:    60.1 kg    Intake/Output Summary (Last 24 hours) at 04/06/2023 1409 Last data filed at 04/06/2023 1139 Gross per 24 hour  Intake 561.08 ml  Output --  Net 561.08 ml   Filed Weights   04/06/23 1209  Weight: 60.1 kg    Examination:  Physical Exam: GEN: NAD, alert and oriented x 3, chronically ill appearance, appears older than stated age HEENT: NCAT, PERRL, EOMI, sclera clear, MMM PULM: CTAB w/o wheezes/crackles, normal respiratory effort, on room air CV: RRR w/o M/G/R GI: abd soft, NTND, NABS, no R/G/M MSK: Noted bilateral AKA NEURO: No focal neurological deficits PSYCH: normal mood/affect Integumentary: No concerning rashes/lesions/wounds noted on exposed skin surfaces    Data Reviewed: I have personally reviewed following labs and imaging studies  CBC: Recent Labs  Lab 04/04/23 1705 04/05/23 2230 04/06/23 0532  WBC 8.7 14.4* 14.5*  NEUTROABS  --  10.3*  --   HGB 13.0 10.1*  9.7*  HCT 41.3 32.4* 31.4*  MCV 90.8 90.8 92.1  PLT 541* 546* 492*   Basic Metabolic Panel: Recent Labs  Lab 04/04/23 1705 04/04/23 1758 04/05/23 2007 04/06/23 0532  NA 137  --  137 139  K 3.6  --  3.5 3.1*  CL 105  --  108 111  CO2 19*  --  19* 20*  GLUCOSE 405*  --  284* 142*  BUN 20  --  19 20  CREATININE 1.68*  --  1.35* 1.41*  CALCIUM 8.6*  --  8.1* 7.9*  MG  --  2.1 1.6* 1.6*  PHOS  --   --  2.6 3.4   GFR: Estimated Creatinine Clearance: 54.8 mL/min (A) (by C-G formula based on SCr of 1.41 mg/dL (H)). Liver Function Tests: Recent Labs  Lab 04/04/23 1705 04/05/23 2007 04/06/23 0532  AST 22 16  16 16   ALT 26 17  17 16   ALKPHOS 146* 100  106 101  BILITOT 0.8 0.6  0.4 0.5  PROT 6.2* 5.2*  5.2* 5.0*  ALBUMIN 1.6* <1.5*  <1.5* <1.5*   Recent Labs  Lab 04/04/23 1705 04/05/23 2007 04/06/23 0532  LIPASE 30 355* 516*   No results for input(s): "AMMONIA" in the last 168 hours. Coagulation  Profile: No results for input(s): "INR", "PROTIME" in the last 168 hours. Cardiac Enzymes: Recent Labs  Lab 04/05/23 2007  CKTOTAL 143   BNP (last 3 results) No results for input(s): "PROBNP" in the last 8760 hours. HbA1C: No results for input(s): "HGBA1C" in the last 72 hours. CBG: Recent Labs  Lab 04/04/23 2116 04/06/23 0042 04/06/23 0422 04/06/23 0827 04/06/23 1234  GLUCAP 242* 228* 127* 93 105*   Lipid Profile: No results for input(s): "CHOL", "HDL", "LDLCALC", "TRIG", "CHOLHDL", "LDLDIRECT" in the last 72 hours. Thyroid Function Tests: Recent Labs    04/05/23 2250  TSH 2.756   Anemia Panel: No results for input(s): "VITAMINB12", "FOLATE", "FERRITIN", "TIBC", "IRON", "RETICCTPCT" in the last 72 hours. Sepsis Labs: Recent Labs  Lab 04/05/23 2230 04/06/23 0218  LATICACIDVEN 1.8 1.1    Recent Results (from the past 240 hours)  Gastrointestinal Panel by PCR , Stool     Status: None   Collection Time: 03/27/23  9:23 PM   Specimen: Stool   Result Value Ref Range Status   Campylobacter species NOT DETECTED NOT DETECTED Final   Plesimonas shigelloides NOT DETECTED NOT DETECTED Final   Salmonella species NOT DETECTED NOT DETECTED Final   Yersinia enterocolitica NOT DETECTED NOT DETECTED Final   Vibrio species NOT DETECTED NOT DETECTED Final   Vibrio cholerae NOT DETECTED NOT DETECTED Final   Enteroaggregative E coli (EAEC) NOT DETECTED NOT DETECTED Final   Enteropathogenic E coli (EPEC) NOT DETECTED NOT DETECTED Final   Enterotoxigenic E coli (ETEC) NOT DETECTED NOT DETECTED Final   Shiga like toxin producing E coli (STEC) NOT DETECTED NOT DETECTED Final   Shigella/Enteroinvasive E coli (EIEC) NOT DETECTED NOT DETECTED Final   Cryptosporidium NOT DETECTED NOT DETECTED Final   Cyclospora cayetanensis NOT DETECTED NOT DETECTED Final   Entamoeba histolytica NOT DETECTED NOT DETECTED Final   Giardia lamblia NOT DETECTED NOT DETECTED Final   Adenovirus F40/41 NOT DETECTED NOT DETECTED Final   Astrovirus NOT DETECTED NOT DETECTED Final   Norovirus GI/GII NOT DETECTED NOT DETECTED Final   Rotavirus A NOT DETECTED NOT DETECTED Final   Sapovirus (I, II, IV, and V) NOT DETECTED NOT DETECTED Final    Comment: Performed at [facility], [address] [place], [place] [address]  Resp panel by RT-PCR (RSV, Flu A&B, Covid) Anterior Nasal Swab     Status: Abnormal   Collection Time: 04/04/23  5:58 PM   Specimen: Anterior Nasal Swab  Result Value Ref Range Status   SARS Coronavirus 2 by RT PCR NEGATIVE NEGATIVE Final    Comment: (NOTE) SARS-CoV-2 target nucleic acids are NOT DETECTED.  The SARS-CoV-2 RNA is generally detectable in upper respiratory specimens during the acute phase of infection. The lowest concentration of SARS-CoV-2 viral copies this assay can detect is 138 copies/mL. A negative result does not preclude SARS-Cov-2 infection and should not be used as the sole basis for treatment or other patient management  decisions. A negative result may occur with  improper specimen collection/handling, submission of specimen other than nasopharyngeal swab, presence of viral mutation(s) within the areas targeted by this assay, and inadequate number of viral copies(<138 copies/mL). A negative result must be combined with clinical observations, patient history, and epidemiological information. The expected result is Negative.  Fact Sheet for Patients:  BloggerCourse.com  Fact Sheet for Healthcare Providers:  SeriousBroker.it  This test is no t yet approved or cleared by the [place] FDA and  has been authorized for detection and/or diagnosis  of SARS-CoV-2 by FDA under an Emergency Use Authorization (EUA). This EUA will remain  in effect (meaning this test can be used) for the duration of the COVID-19 declaration under Section 564(b)(1) of the Act, 21 U.S.C.section 360bbb-3(b)(1), unless the authorization is terminated  or revoked sooner.       Influenza A by PCR POSITIVE (A) NEGATIVE Final   Influenza B by PCR NEGATIVE NEGATIVE Final    Comment: (NOTE) The Xpert Xpress SARS-CoV-2/FLU/RSV plus assay is intended as an aid in the diagnosis of influenza from Nasopharyngeal swab specimens and should not be used as a sole basis for treatment. Nasal washings and aspirates are unacceptable for Xpert Xpress SARS-CoV-2/FLU/RSV testing.  Fact Sheet for Patients: BloggerCourse.com  Fact Sheet for Healthcare Providers: SeriousBroker.it  This test is not yet approved or cleared by the [place] FDA and has been authorized for detection and/or diagnosis of SARS-CoV-2 by FDA under an Emergency Use Authorization (EUA). This EUA will remain in effect (meaning this test can be used) for the duration of the COVID-19 declaration under Section 564(b)(1) of the Act, 21 U.S.C. section 360bbb-3(b)(1), unless  the authorization is terminated or revoked.     Resp Syncytial Virus by PCR NEGATIVE NEGATIVE Final    Comment: (NOTE) Fact Sheet for Patients: BloggerCourse.com  Fact Sheet for Healthcare Providers: SeriousBroker.it  This test is not yet approved or cleared by the [place] FDA and has been authorized for detection and/or diagnosis of SARS-CoV-2 by FDA under an Emergency Use Authorization (EUA). This EUA will remain in effect (meaning this test can be used) for the duration of the COVID-19 declaration under Section 564(b)(1) of the Act, 21 U.S.C. section 360bbb-3(b)(1), unless the authorization is terminated or revoked.  Performed at [facility], [address], [place], [place] [address]          Radiology Studies: DG CHEST PORT 1 VIEW Result Date: 04/06/2023 CLINICAL DATA:  Cough EXAM: PORTABLE CHEST 1 VIEW COMPARISON:  03/02/2023 FINDINGS: The heart size and mediastinal contours are within normal limits. Both lungs are clear. The visualized skeletal structures are unremarkable. IMPRESSION: Normal study. Electronically Signed   By: Charlett Nose M.D.   On: 04/06/2023 00:23   CT ABDOMEN PELVIS W CONTRAST Result Date: 04/06/2023 CLINICAL DATA:  Pancreatitis EXAM: CT ABDOMEN AND PELVIS WITH CONTRAST TECHNIQUE: Multidetector CT imaging of the abdomen and pelvis was performed using the standard protocol following bolus administration of intravenous contrast. RADIATION DOSE REDUCTION: This exam was performed according to the departmental dose-optimization program which includes automated exposure control, adjustment of the mA and/or kV according to patient size and/or use of iterative reconstruction technique. CONTRAST:  OMNIPAQUE IOHEXOL 300 MG/ML  SOLN COMPARISON:  03/24/2023 FINDINGS: Lower chest: No acute abnormality Hepatobiliary: No focal hepatic abnormality. Gallbladder unremarkable. Pancreas: No focal  abnormality or ductal dilatation. Some edema noted around the pancreatic body and tail compatible with acute pancreatitis. Spleen: No focal abnormality.  Normal size. Adrenals/Urinary Tract: No adrenal abnormality. No focal renal abnormality. No stones or hydronephrosis. Urinary bladder is unremarkable. Stomach/Bowel: Stable gastric stimulator implant. Stomach, large and small bowel grossly unremarkable. Appendix is normal. Vascular/Lymphatic: No evidence of aneurysm or adenopathy. Reproductive: Uterus and adnexa unremarkable.  No mass. Other: No free fluid or free air. Anasarca like edema again noted throughout the abdominal wall. Musculoskeletal: No acute bony abnormality. IMPRESSION: Mild edema surrounding the pancreatic body and tail compatible with acute pancreatitis. No complicating feature. Electronically Signed   By: Caryn Bee  Dover M.D.   On: 04/06/2023 00:23        Scheduled Meds:  gabapentin  300 mg Oral Q8H   insulin aspart  0-9 Units Subcutaneous Q4H   insulin glargine-yfgn  5 Units Subcutaneous QHS   metoCLOPramide (REGLAN) injection  10 mg Intravenous Q8H   oseltamivir  30 mg Oral Q12H   traZODone  150 mg Oral QHS   Continuous Infusions:  sodium chloride 150 mL/hr at 04/06/23 1211   potassium chloride 10 mEq (04/06/23 1344)     LOS: 0 days    Time spent: 56 minutes spent on chart review, discussion with nursing staff, consultants, updating family and interview/physical exam; more than 50% of that time was spent in counseling and/or coordination of care.    Alvira Philips [place], DO [facility] Available via Epic secure chat 7am-7pm After these hours, please refer to coverage provider listed on [email or web address] 04/06/2023, 2:09 PM

## 2023-04-06 NOTE — Inpatient Diabetes Management (Signed)
Inpatient Diabetes Program Recommendations  AACE/ADA: New Consensus Statement on Inpatient Glycemic Control (2015)  Target Ranges:  Prepandial:   less than 140 mg/dL      Peak postprandial:   less than 180 mg/dL (1-2 hours)      Critically ill patients:  140 - 180 mg/dL   Lab Results  Component Value Date   GLUCAP 105 (H) 04/06/2023   HGBA1C 7.2 (H) 03/02/2023    Review of Glycemic Control  Diabetes history: T1DM Outpatient Diabetes medications: Humulin R 0-15 units TID, Tresiba 16 units BID Current orders for Inpatient glycemic control: Novolog 0-9 units Q4H, Semglee 5 units QD  Met with patient at bedside to confirm insulins.  She last met with her endocrinologist, Dr. Lonzo Cloud in May of 2025.  She confirms above home medications.  She is wearing her Dexcom G6.  She only takes her Evaristo Bury if her glucose is elevated.  Denies episodes of hypoglycemia but is aware how to treat and S&S.  Encouraged her to make a F/U appt. With Dr. Lonzo Cloud.    Will continue to follow while inpatient.  Thank you, Dulce Sellar, MSN, CDCES Diabetes Coordinator Inpatient Diabetes Program [phone] (team pager from 8a-5p)

## 2023-04-07 ENCOUNTER — Telehealth: Payer: Self-pay | Admitting: Orthopedic Surgery

## 2023-04-07 ENCOUNTER — Other Ambulatory Visit: Payer: Self-pay

## 2023-04-07 ENCOUNTER — Other Ambulatory Visit (HOSPITAL_COMMUNITY): Payer: Self-pay

## 2023-04-07 DIAGNOSIS — J101 Influenza due to other identified influenza virus with other respiratory manifestations: Secondary | ICD-10-CM | POA: Diagnosis not present

## 2023-04-07 DIAGNOSIS — Z89611 Acquired absence of right leg above knee: Secondary | ICD-10-CM

## 2023-04-07 LAB — COMPREHENSIVE METABOLIC PANEL
ALT: 13 U/L (ref 0–44)
AST: 17 U/L (ref 15–41)
Albumin: <1.5 g/dL — ABNORMAL LOW (ref 3.5–5.0)
Alkaline Phosphatase: 84 U/L (ref 38–126)
Anion gap: 6 (ref 5–15)
BUN: 20 mg/dL (ref 6–20)
CO2: 19 mmol/L — ABNORMAL LOW (ref 22–32)
Calcium: 7.5 mg/dL — ABNORMAL LOW (ref 8.9–10.3)
Chloride: 109 mmol/L (ref 98–111)
Creatinine, Ser: 1.56 mg/dL — ABNORMAL HIGH (ref 0.44–1.00)
GFR, Estimated: 45 mL/min — ABNORMAL LOW (ref >60)
Glucose, Bld: 116 mg/dL — ABNORMAL HIGH (ref 70–99)
Potassium: 4.5 mmol/L (ref 3.5–5.1)
Sodium: 134 mmol/L — ABNORMAL LOW (ref 135–145)
Total Bilirubin: 0.2 mg/dL (ref 0.0–1.2)
Total Protein: 4.2 g/dL — ABNORMAL LOW (ref 6.5–8.1)

## 2023-04-07 LAB — CBC
HCT: 28.9 % — ABNORMAL LOW (ref 36.0–46.0)
Hemoglobin: 8.4 g/dL — ABNORMAL LOW (ref 12.0–15.0)
MCH: 28.3 pg (ref 26.0–34.0)
MCHC: 29.1 g/dL — ABNORMAL LOW (ref 30.0–36.0)
MCV: 97.3 fL (ref 80.0–100.0)
Platelets: 405 10*3/uL — ABNORMAL HIGH (ref 150–400)
RBC: 2.97 MIL/uL — ABNORMAL LOW (ref 3.87–5.11)
RDW: 15.2 % (ref 11.5–15.5)
WBC: 13.7 10*3/uL — ABNORMAL HIGH (ref 4.0–10.5)
nRBC: 0 % (ref 0.0–0.2)

## 2023-04-07 LAB — HEMOGLOBIN A1C
Hgb A1c MFr Bld: 6.8 % — ABNORMAL HIGH (ref 4.8–5.6)
Mean Plasma Glucose: 148 mg/dL

## 2023-04-07 LAB — GLUCOSE, CAPILLARY: Glucose-Capillary: 115 mg/dL — ABNORMAL HIGH (ref 70–99)

## 2023-04-07 LAB — MAGNESIUM: Magnesium: 1.9 mg/dL (ref 1.7–2.4)

## 2023-04-07 LAB — LIPASE, BLOOD: Lipase: 120 U/L — ABNORMAL HIGH (ref 11–51)

## 2023-04-07 MED ORDER — HYDROMORPHONE HCL 1 MG/ML IJ SOLN
1.0000 mg | Freq: Once | INTRAMUSCULAR | Status: AC
  Administered 2023-04-07: 1 mg via INTRAVENOUS
  Filled 2023-04-07: qty 1

## 2023-04-07 MED ORDER — OSELTAMIVIR PHOSPHATE 30 MG PO CAPS
30.0000 mg | ORAL_CAPSULE | Freq: Two times a day (BID) | ORAL | 0 refills | Status: AC
  Filled 2023-04-07: qty 7, 4d supply, fill #0

## 2023-04-07 NOTE — Progress Notes (Signed)
Discharge instructions reviewed with patient. All questions answered. All belongings accounted for. Patient to follow up with MD in 1 weeks. Patient medications hand delivered from outpatient pharmacy. PIV removed x2.   PT[facility] called by [facility] waiting for transport home

## 2023-04-07 NOTE — TOC Transition Note (Addendum)
Transition of Care [facility]) - Discharge Note   Patient Details  Name: Kelli Hudson MRN: [id number] Date of Birth: Aug 22, 1990  Transition of [facility]) CM/SW Contact:  Larrie Kass, LCSW Phone Number: 04/07/2023, 10:09 AM   Clinical Narrative:      Pt to d/c home. Pt  reports not having her wheelchair with her. CSW arranged PTAR transport home. TOC sign off.        Patient Goals and CMS Choice            Discharge Placement                       Discharge Plan and Services Additional resources added to the After Visit Summary for                                       Social Drivers of Health (SDOH) Interventions SDOH Screenings   Food Insecurity: No Food Insecurity (04/06/2023)  Housing: Low Risk  (04/06/2023)  Recent Concern: Housing - High Risk (03/26/2023)  Transportation Needs: No Transportation Needs (04/06/2023)  Utilities: Not At Risk (04/06/2023)  Alcohol Screen: Low Risk  (05/29/2021)  Depression (PHQ2-9): Low Risk  (05/29/2021)  Recent Concern: Depression (PHQ2-9) - Medium Risk (04/10/2021)  Financial Resource Strain: Low Risk  (02/13/2022)   Received from [facility]  Social Connections: Moderately Integrated (02/21/2023)  Stress: No Stress Concern Present (12/29/2022)   Received from [facility]  Tobacco Use: Low Risk  (04/06/2023)     Readmission Risk Interventions    03/26/2023    1:56 PM 03/07/2023    1:30 PM 03/03/2023   11:39 AM  Readmission Risk Prevention Plan  Transportation Screening Complete Complete Complete  Medication Review Oceanographer) Complete Complete Complete  PCP or Specialist appointment within 3-5 days of discharge Complete Complete   HRI or Home Care Consult Complete Not Complete Complete  SW Recovery Care/Counseling Consult Complete Complete Complete  Palliative Care Screening Not Applicable Not Complete Not Applicable  Skilled Nursing Facility Not Applicable Not  Complete Not Applicable

## 2023-04-07 NOTE — Telephone Encounter (Signed)
Pt is s/p bilat AKA order is in chart

## 2023-04-07 NOTE — Discharge Summary (Signed)
Physician Discharge Summary  Kelli Hudson ZOX:[id number] DOB: 10-29-1990 DOA: 04/05/2023  PCP: Kelli Martes, NP  Admit date: 04/05/2023 Discharge date: 04/07/2023  Admitted From: Home Disposition: Home  Recommendations for Outpatient Follow-up:  Follow up with PCP in 1-2 weeks  Home Health: No Equipment/Devices: None  Discharge Condition: Stable CODE STATUS: Full code Diet recommendation: Consistent carbohydrate diet  History of present illness:  Kelli Hudson is a 33 y.o. female with past medical history significant for HTN, DM 1, diabetic gastroparesis s/p gastric stimulator, CKD stage II-3A, retinopathy, neuropathy, history of necrotizing fasciitis s/p bilateral AKA, chronic pain syndrome, history of C. difficile colitis; recent diagnosis of influenza A same day who presented to [facility] ED on 2/15 complaining of abdominal pain, nausea and vomiting, abdominal pain; poor oral intake.  Reports compliance with her home medications.  No sick contacts.  Denies headache, no chest pain, no palpitations, no shortness of breath, no fever/chills, no cough/congestion.   In the ED, temperature 97.7 F, HR 113, RR 21, BP 175/121, SpO2 100% on room air.  WBC 14.4, hemoglobin 10.1, platelet count 546.  Sodium 137, potassium 3.5, chloride 108, CO2 19, glucose 284, BUN 19, creat 1.35.  AST 16, ALT 17, total bilirubin 0.4.  Anion gap 10.  Lipase 355.  Lactic acid 1.8.  CK 143, high-sensitivity troponin 8.  VBG with pH 7.53, pCO2 23, pO2 134.  hCG negative.  Beta-hydroxybutyrate acid 1.43.  TSH 2.756.  Urinalysis with negative nitrite, negative leukocytes, rare bacteria, 11-20 WBCs, 5 ketones, greater than 500 glucose.  Chest x-ray with no acute cardiopulmonary disease process.  CT abdomen/pelvis with contrast with mild edema surrounding pancreatic tail/body compatible with acute pancreatitis; no complicating features.  Hospital course:  Acute  pancreatitis History of gastroparesis with intractable nausea and vomiting Patient with frequent admissions for gastroparesis with nausea and vomiting.  Had gastric stimulator placed in May 2024 at [facility].  Patient complaining of abdominal pain, nausea and vomiting.  Patient is afebrile, WBC count slightly elevated 14.4 likely reactive.  Lactic acid within normal limits.  Lipase elevated 355.  CT abdomen/pelvis with findings consistent with acute pancreatitis.  Patient was initially kept n.p.o., supported with aggressive IV fluid resuscitation and pain management.  Patient was also supported with Reglan 10 mg IV every 8 hours due to her underlying gastroparesis as likely contributing factor.  Patient's diet was slowly advanced with toleration.  Lipase improved to 120 at time of discharge.  Outpatient follow-up with PCP.   Influenza A viral infection Diagnosed 2/15 with positive influenza A PCR.  On room air.  Chest x-ray with no acute cardiopulmonary disease process.  Continue Tamiflu 30 mg PO BID x 5 days   Type 1 diabetes mellitus Hemoglobin A1c hemoglobin A1c 7.2 on 03/02/2023, fairly well-controlled.  Home regimen includes Tresiba 15 units subcutaneously twice daily if CBG greater than 150; NovoLog SSI.  CKD stage 3A Creatinine 1.56; stable.  Hypokalemia Repleted during hospitalization.  Potassium 4.5 at time of discharge.   Essential hypertension Continue carvedilol, amlodipine, spironolactone.   Chronic pain syndrome Gabapentin 300 mg p.o. every 8 hours   Anxiety/depression Atarax 10mg  PO TID PRN anxiety   Insomnia Trazodone 150 mg p.o. nightly   S/p bilateral AKA Supportive care  Discharge Diagnoses:  Principal Problem:   Influenza A Active Problems:   Diabetic gastroparesis (HCC)   Uncontrolled type 1 diabetes mellitus with hyperglycemia, with long-term current use of insulin (HCC)   Elevated lipase  Discharge Instructions  Discharge Instructions     Call  MD for:  difficulty breathing, headache or visual disturbances   Complete by: As directed    Call MD for:  extreme fatigue   Complete by: As directed    Call MD for:  persistant dizziness or light-headedness   Complete by: As directed    Call MD for:  persistant nausea and vomiting   Complete by: As directed    Call MD for:  severe uncontrolled pain   Complete by: As directed    Call MD for:  temperature >100.4   Complete by: As directed    Diet - low sodium heart healthy   Complete by: As directed    Increase activity slowly   Complete by: As directed       Allergies as of 04/07/2023       Reactions   Ibuprofen Other (See Comments)   07/23/22 - Worsening kidney function after 3 doses of ibuprofen 400mg .  Creatinine increased from 1.3 to 2.  Creatinine returned to normal after stopping.   Lisinopril Cough, Other (See Comments), Itching, Rash   Breakout in rash        Medication List     STOP taking these medications    doxycycline 100 MG capsule Commonly known as: MONODOX   feeding supplement (GLUCERNA SHAKE) Liqd       TAKE these medications    acetaminophen 500 MG tablet Commonly known as: TYLENOL Take 1 tablet (500 mg total) by mouth every 6 (six) hours as needed.   amLODipine 10 MG tablet Commonly known as: NORVASC Take 1 tablet (10 mg total) by mouth daily.   ascorbic acid 1000 MG tablet Commonly known as: VITAMIN C Take 1 tablet (1,000 mg total) by mouth daily. What changed: when to take this   benzonatate 100 MG capsule Commonly known as: TESSALON Take 1 capsule (100 mg total) by mouth every 8 (eight) hours.   carvedilol 6.25 MG tablet Commonly known as: COREG Take 1 tablet (6.25 mg total) by mouth 2 (two) times daily with a meal.   Dexcom G6 Transmitter Misc 1 Device by Does not apply route as directed.   famotidine 20 MG tablet Commonly known as: PEPCID Take 1 tablet (20 mg total) by mouth 2 (two) times daily.   gabapentin 300 MG  capsule Commonly known as: NEURONTIN Take 1 capsule (300 mg total) by mouth every 8 (eight) hours.   hydrOXYzine 10 MG tablet Commonly known as: ATARAX Take 1 tablet (10 mg total) by mouth 3 (three) times daily as needed for anxiety.   insulin aspart 100 UNIT/ML injection Commonly known as: novoLOG Inject 0-9 Units into the skin 3 (three) times daily with meals. What changed: how much to take   Insulin Pen Needle 32G X 4 MM Misc 1 Device by Does not apply route in the morning, at noon, in the evening, and at bedtime.   MAGNESIUM PO Take 1 tablet by mouth daily.   methocarbamol 500 MG tablet Commonly known as: ROBAXIN Take 500 mg by mouth 3 (three) times daily as needed for muscle spasms.   metoCLOPramide 10 MG tablet Commonly known as: REGLAN Take 1 tablet (10 mg total) by mouth every 6 (six) hours.   multivitamin with minerals Tabs tablet Take 1 tablet by mouth daily.   Nexplanon 68 MG Impl implant Generic drug: etonogestrel 1 each by Subdermal route continuous.   oseltamivir 30 MG capsule Commonly known as: TAMIFLU Take 1 capsule (  30 mg total) by mouth every 12 (twelve) hours for 7 doses. What changed:  medication strength how much to take   oxyCODONE 5 MG immediate release tablet Commonly known as: Oxy IR/ROXICODONE Take 1 tablet (5 mg total) by mouth every 6 (six) hours as needed for severe pain (pain score 7-10).   potassium chloride SA 20 MEQ tablet Commonly known as: KLOR-CON M Take 20 mEq by mouth daily.   spironolactone 25 MG tablet Commonly known as: ALDACTONE Take 25 mg by mouth 2 (two) times daily.   traZODone 150 MG tablet Commonly known as: DESYREL Take 150 mg by mouth at bedtime.   Evaristo Bury FlexTouch 200 UNIT/ML FlexTouch Pen Generic drug: insulin degludec Inject 15 Units into the skin 2 (two) times daily. If CBG is > 150        Follow-up Information     Kelli Martes, NP. Schedule an appointment as soon as possible for a visit in 1  week(s).   Specialty: Nurse Practitioner Contact information: [address] [place] [address] [phone]                Allergies  Allergen Reactions   Ibuprofen Other (See Comments)    07/23/22 - Worsening kidney function after 3 doses of ibuprofen 400mg .  Creatinine increased from 1.3 to 2.  Creatinine returned to normal after stopping.   Lisinopril Cough, Other (See Comments), Itching and Rash    Breakout in rash    Consultations: none   Procedures/Studies: DG CHEST PORT 1 VIEW Result Date: 04/06/2023 CLINICAL DATA:  Cough EXAM: PORTABLE CHEST 1 VIEW COMPARISON:  03/02/2023 FINDINGS: The heart size and mediastinal contours are within normal limits. Both lungs are clear. The visualized skeletal structures are unremarkable. IMPRESSION: Normal study. Electronically Signed   By: Charlett Nose M.D.   On: 04/06/2023 00:23   CT ABDOMEN PELVIS W CONTRAST Result Date: 04/06/2023 CLINICAL DATA:  Pancreatitis EXAM: CT ABDOMEN AND PELVIS WITH CONTRAST TECHNIQUE: Multidetector CT imaging of the abdomen and pelvis was performed using the standard protocol following bolus administration of intravenous contrast. RADIATION DOSE REDUCTION: This exam was performed according to the departmental dose-optimization program which includes automated exposure control, adjustment of the mA and/or kV according to patient size and/or use of iterative reconstruction technique. CONTRAST:  OMNIPAQUE IOHEXOL 300 MG/ML  SOLN COMPARISON:  03/24/2023 FINDINGS: Lower chest: No acute abnormality Hepatobiliary: No focal hepatic abnormality. Gallbladder unremarkable. Pancreas: No focal abnormality or ductal dilatation. Some edema noted around the pancreatic body and tail compatible with acute pancreatitis. Spleen: No focal abnormality.  Normal size. Adrenals/Urinary Tract: No adrenal abnormality. No focal renal abnormality. No stones or hydronephrosis. Urinary bladder is unremarkable. Stomach/Bowel:  Stable gastric stimulator implant. Stomach, large and small bowel grossly unremarkable. Appendix is normal. Vascular/Lymphatic: No evidence of aneurysm or adenopathy. Reproductive: Uterus and adnexa unremarkable.  No mass. Other: No free fluid or free air. Anasarca like edema again noted throughout the abdominal wall. Musculoskeletal: No acute bony abnormality. IMPRESSION: Mild edema surrounding the pancreatic body and tail compatible with acute pancreatitis. No complicating feature. Electronically Signed   By: Charlett Nose M.D.   On: 04/06/2023 00:23   CT ABDOMEN PELVIS W CONTRAST Result Date: 03/24/2023 CLINICAL DATA:  Abdominal pain. EXAM: CT ABDOMEN AND PELVIS WITH CONTRAST TECHNIQUE: Multidetector CT imaging of the abdomen and pelvis was performed using the standard protocol following bolus administration of intravenous contrast. RADIATION DOSE REDUCTION: This exam was performed according to the departmental dose-optimization  program which includes automated exposure control, adjustment of the mA and/or kV according to patient size and/or use of iterative reconstruction technique. CONTRAST:  OMNIPAQUE IOHEXOL 300 MG/ML  SOLN COMPARISON:  November 28, 2022 FINDINGS: Lower chest: No acute abnormality. Hepatobiliary: No focal liver abnormality is seen. No gallstones, gallbladder wall thickening, or biliary dilatation. Pancreas: Unremarkable. No pancreatic ductal dilatation or surrounding inflammatory changes. Spleen: Normal in size without focal abnormality. Adrenals/Urinary Tract: Adrenal glands are unremarkable. Kidneys are normal, without renal calculi, focal lesion, or hydronephrosis. The urinary bladder is markedly distended and is otherwise unremarkable. Stomach/Bowel: There is a small hiatal hernia with mild to moderate severity thickening of the visualized portion of the distal esophagus. Stable gastric stimulator implant positioning is seen. Appendix appears normal. No evidence of bowel dilatation.  Moderate severity diffuse colonic wall thickening is noted. Poorly distended large bowel is also seen. Vascular/Lymphatic: No significant vascular findings are present. No enlarged abdominal or pelvic lymph nodes. Reproductive: Uterus and bilateral adnexa are unremarkable. Other: There is mild to moderate severity diffuse anasarca. No abdominopelvic ascites. Musculoskeletal: No acute or significant osseous findings. IMPRESSION: 1. Small hiatal hernia with mild to moderate severity thickening of the visualized portion of the distal esophagus. While this may represent sequelae associated with distal esophagitis, GI consultation is recommended. 2. Moderate severity diffuse colonic wall thickening which may represent sequelae associated with colitis. 3. Mild to moderate severity diffuse anasarca. Electronically Signed   By: Aram Candela M.D.   On: 03/24/2023 17:57     Subjective: Patient seen examined bedside, sitting upright in bed.  Tolerating oral intake and advance diet since yesterday with no further vomiting.  Stable for discharge home.  Denies headache, no chest pain, no shortness of breath, no fever/chills/night sweats, no nausea/vomiting/diarrhea.  No acute events overnight per nurse staff.  Discharge Exam: Vitals:   04/06/23 2354 04/07/23 0453  BP: 108/78 (!) 157/114  Pulse: 90 (!) 105  Resp: 18 18  Temp: 98 F (36.7 C) 98 F (36.7 C)  SpO2: 100% 99%   Vitals:   04/06/23 1610 04/06/23 1935 04/06/23 2354 04/07/23 0453  BP: (!) 145/107 106/69 108/78 (!) 157/114  Pulse: 100 89 90 (!) 105  Resp: 18 19 18 18   Temp: 98.2 F (36.8 C) 97.7 F (36.5 C) 98 F (36.7 C) 98 F (36.7 C)  TempSrc: Oral Oral Oral Oral  SpO2: 100% 100% 100% 99%  Weight:      Height:        Physical Exam: GEN: NAD, alert and oriented x 3, chronically ill appearance, appears older than stated age HEENT: NCAT, PERRL, EOMI, sclera clear, MMM PULM: CTAB w/o wheezes/crackles, normal respiratory effort, on  room air CV: RRR w/o M/G/R GI: abd soft, NTND, NABS, no R/G/M MSK: Noted bilateral AKA NEURO: No focal neurological deficits PSYCH: normal mood/affect Integumentary: No concerning rashes/lesions/wounds noted on exposed skin surfaces    The results of significant diagnostics from this hospitalization (including imaging, microbiology, ancillary and laboratory) are listed below for reference.     Microbiology: Recent Results (from the past 240 hours)  Resp panel by RT-PCR (RSV, Flu A&B, Covid) Anterior Nasal Swab     Status: Abnormal   Collection Time: 04/04/23  5:58 PM   Specimen: Anterior Nasal Swab  Result Value Ref Range Status   SARS Coronavirus 2 by RT PCR NEGATIVE NEGATIVE Final    Comment: (NOTE) SARS-CoV-2 target nucleic acids are NOT DETECTED.  The SARS-CoV-2 RNA is generally detectable in  upper respiratory specimens during the acute phase of infection. The lowest concentration of SARS-CoV-2 viral copies this assay can detect is 138 copies/mL. A negative result does not preclude SARS-Cov-2 infection and should not be used as the sole basis for treatment or other patient management decisions. A negative result may occur with  improper specimen collection/handling, submission of specimen other than nasopharyngeal swab, presence of viral mutation(s) within the areas targeted by this assay, and inadequate number of viral copies(<138 copies/mL). A negative result must be combined with clinical observations, patient history, and epidemiological information. The expected result is Negative.  Fact Sheet for Patients:  BloggerCourse.com  Fact Sheet for Healthcare Providers:  SeriousBroker.it  This test is no t yet approved or cleared by the [place] FDA and  has been authorized for detection and/or diagnosis of SARS-CoV-2 by FDA under an Emergency Use Authorization (EUA). This EUA will remain  in effect (meaning this  test can be used) for the duration of the COVID-19 declaration under Section 564(b)(1) of the Act, 21 U.S.C.section 360bbb-3(b)(1), unless the authorization is terminated  or revoked sooner.       Influenza A by PCR POSITIVE (A) NEGATIVE Final   Influenza B by PCR NEGATIVE NEGATIVE Final    Comment: (NOTE) The Xpert Xpress SARS-CoV-2/FLU/RSV plus assay is intended as an aid in the diagnosis of influenza from Nasopharyngeal swab specimens and should not be used as a sole basis for treatment. Nasal washings and aspirates are unacceptable for Xpert Xpress SARS-CoV-2/FLU/RSV testing.  Fact Sheet for Patients: BloggerCourse.com  Fact Sheet for Healthcare Providers: SeriousBroker.it  This test is not yet approved or cleared by the [place] FDA and has been authorized for detection and/or diagnosis of SARS-CoV-2 by FDA under an Emergency Use Authorization (EUA). This EUA will remain in effect (meaning this test can be used) for the duration of the COVID-19 declaration under Section 564(b)(1) of the Act, 21 U.S.C. section 360bbb-3(b)(1), unless the authorization is terminated or revoked.     Resp Syncytial Virus by PCR NEGATIVE NEGATIVE Final    Comment: (NOTE) Fact Sheet for Patients: BloggerCourse.com  Fact Sheet for Healthcare Providers: SeriousBroker.it  This test is not yet approved or cleared by the [place] FDA and has been authorized for detection and/or diagnosis of SARS-CoV-2 by FDA under an Emergency Use Authorization (EUA). This EUA will remain in effect (meaning this test can be used) for the duration of the COVID-19 declaration under Section 564(b)(1) of the Act, 21 U.S.C. section 360bbb-3(b)(1), unless the authorization is terminated or revoked.  Performed at [facility], [address], [place], [place] [address]      Labs: BNP  (last 3 results) Recent Labs    11/30/22 0754 12/01/22 0736 12/02/22 0312  BNP 1,098.0* 1,207.2* 1,252.9*   Basic Metabolic Panel: Recent Labs  Lab 04/04/23 1705 04/04/23 1758 04/05/23 2007 04/06/23 0532 04/07/23 0430  NA 137  --  137 139 134*  K 3.6  --  3.5 3.1* 4.5  CL 105  --  108 111 109  CO2 19*  --  19* 20* 19*  GLUCOSE 405*  --  284* 142* 116*  BUN 20  --  19 20 20   CREATININE 1.68*  --  1.35* 1.41* 1.56*  CALCIUM 8.6*  --  8.1* 7.9* 7.5*  MG  --  2.1 1.6* 1.6* 1.9  PHOS  --   --  2.6 3.4  --    Liver Function Tests: Recent Labs  Lab  04/04/23 1705 04/05/23 2007 04/06/23 0532 04/07/23 0430  AST 22 16  16 16 17   ALT 26 17  17 16 13   ALKPHOS 146* 100  106 101 84  BILITOT 0.8 0.6  0.4 0.5 0.2  PROT 6.2* 5.2*  5.2* 5.0* 4.2*  ALBUMIN 1.6* <1.5*  <1.5* <1.5* <1.5*   Recent Labs  Lab 04/04/23 1705 04/05/23 2007 04/06/23 0532 04/07/23 0430  LIPASE 30 355* 516* 120*   No results for input(s): "AMMONIA" in the last 168 hours. CBC: Recent Labs  Lab 04/04/23 1705 04/05/23 2230 04/06/23 0532 04/07/23 0430  WBC 8.7 14.4* 14.5* 13.7*  NEUTROABS  --  10.3*  --   --   HGB 13.0 10.1* 9.7* 8.4*  HCT 41.3 32.4* 31.4* 28.9*  MCV 90.8 90.8 92.1 97.3  PLT 541* 546* 492* 405*   Cardiac Enzymes: Recent Labs  Lab 04/05/23 2007  CKTOTAL 143   BNP: Invalid input(s): "POCBNP" CBG: Recent Labs  Lab 04/06/23 0827 04/06/23 1234 04/06/23 1623 04/06/23 2040 04/07/23 0838  GLUCAP 93 105* 115* 188* 115*   D-Dimer No results for input(s): "DDIMER" in the last 72 hours. Hgb A1c Recent Labs    04/05/23 2230  HGBA1C 6.8*   Lipid Profile No results for input(s): "CHOL", "HDL", "LDLCALC", "TRIG", "CHOLHDL", "LDLDIRECT" in the last 72 hours. Thyroid function studies Recent Labs    04/05/23 2250  TSH 2.756   Anemia work up No results for input(s): "VITAMINB12", "FOLATE", "FERRITIN", "TIBC", "IRON", "RETICCTPCT" in the last 72 hours. Urinalysis     Component Value Date/Time   COLORURINE YELLOW 04/05/2023 0756   APPEARANCEUR HAZY (A) 04/05/2023 0756   LABSPEC >1.046 (H) 04/05/2023 0756   PHURINE 5.0 04/05/2023 0756   GLUCOSEU >=500 (A) 04/05/2023 0756   GLUCOSEU NEGATIVE 05/22/2020 1417   HGBUR SMALL (A) 04/05/2023 0756   BILIRUBINUR NEGATIVE 04/05/2023 0756   KETONESUR 5 (A) 04/05/2023 0756   PROTEINUR >=300 (A) 04/05/2023 0756   UROBILINOGEN 0.2 05/22/2020 1417   NITRITE NEGATIVE 04/05/2023 0756   LEUKOCYTESUR NEGATIVE 04/05/2023 0756   Sepsis Labs Recent Labs  Lab 04/04/23 1705 04/05/23 2230 04/06/23 0532 04/07/23 0430  WBC 8.7 14.4* 14.5* 13.7*   Microbiology Recent Results (from the past 240 hours)  Resp panel by RT-PCR (RSV, Flu A&B, Covid) Anterior Nasal Swab     Status: Abnormal   Collection Time: 04/04/23  5:58 PM   Specimen: Anterior Nasal Swab  Result Value Ref Range Status   SARS Coronavirus 2 by RT PCR NEGATIVE NEGATIVE Final    Comment: (NOTE) SARS-CoV-2 target nucleic acids are NOT DETECTED.  The SARS-CoV-2 RNA is generally detectable in upper respiratory specimens during the acute phase of infection. The lowest concentration of SARS-CoV-2 viral copies this assay can detect is 138 copies/mL. A negative result does not preclude SARS-Cov-2 infection and should not be used as the sole basis for treatment or other patient management decisions. A negative result may occur with  improper specimen collection/handling, submission of specimen other than nasopharyngeal swab, presence of viral mutation(s) within the areas targeted by this assay, and inadequate number of viral copies(<138 copies/mL). A negative result must be combined with clinical observations, patient history, and epidemiological information. The expected result is Negative.  Fact Sheet for Patients:  BloggerCourse.com  Fact Sheet for Healthcare Providers:  SeriousBroker.it  This test is  no t yet approved or cleared by the [place] FDA and  has been authorized for detection and/or diagnosis of SARS-CoV-2 by FDA under  an Emergency Use Authorization (EUA). This EUA will remain  in effect (meaning this test can be used) for the duration of the COVID-19 declaration under Section 564(b)(1) of the Act, 21 U.S.C.section 360bbb-3(b)(1), unless the authorization is terminated  or revoked sooner.       Influenza A by PCR POSITIVE (A) NEGATIVE Final   Influenza B by PCR NEGATIVE NEGATIVE Final    Comment: (NOTE) The Xpert Xpress SARS-CoV-2/FLU/RSV plus assay is intended as an aid in the diagnosis of influenza from Nasopharyngeal swab specimens and should not be used as a sole basis for treatment. Nasal washings and aspirates are unacceptable for Xpert Xpress SARS-CoV-2/FLU/RSV testing.  Fact Sheet for Patients: BloggerCourse.com  Fact Sheet for Healthcare Providers: SeriousBroker.it  This test is not yet approved or cleared by the [place] FDA and has been authorized for detection and/or diagnosis of SARS-CoV-2 by FDA under an Emergency Use Authorization (EUA). This EUA will remain in effect (meaning this test can be used) for the duration of the COVID-19 declaration under Section 564(b)(1) of the Act, 21 U.S.C. section 360bbb-3(b)(1), unless the authorization is terminated or revoked.     Resp Syncytial Virus by PCR NEGATIVE NEGATIVE Final    Comment: (NOTE) Fact Sheet for Patients: BloggerCourse.com  Fact Sheet for Healthcare Providers: SeriousBroker.it  This test is not yet approved or cleared by the [place] FDA and has been authorized for detection and/or diagnosis of SARS-CoV-2 by FDA under an Emergency Use Authorization (EUA). This EUA will remain in effect (meaning this test can be used) for the duration of the COVID-19 declaration under Section  564(b)(1) of the Act, 21 U.S.C. section 360bbb-3(b)(1), unless the authorization is terminated or revoked.  Performed at [facility], [address], [place], [place] [address]      Time coordinating discharge: Over 30 minutes  SIGNED:   Alvira Philips [place], DO  [facility] 04/07/2023, 9:01 AM

## 2023-04-07 NOTE — Telephone Encounter (Signed)
Patient states she need a new Rx for PT sent to [facility] on [facility]

## 2023-04-13 ENCOUNTER — Ambulatory Visit: Payer: 59 | Attending: Orthopedic Surgery

## 2023-04-13 DIAGNOSIS — M6281 Muscle weakness (generalized): Secondary | ICD-10-CM | POA: Diagnosis present

## 2023-04-13 DIAGNOSIS — Z89611 Acquired absence of right leg above knee: Secondary | ICD-10-CM | POA: Diagnosis not present

## 2023-04-13 DIAGNOSIS — Z89612 Acquired absence of left leg above knee: Secondary | ICD-10-CM | POA: Diagnosis not present

## 2023-04-13 DIAGNOSIS — R2689 Other abnormalities of gait and mobility: Secondary | ICD-10-CM | POA: Diagnosis present

## 2023-04-13 DIAGNOSIS — R293 Abnormal posture: Secondary | ICD-10-CM | POA: Diagnosis present

## 2023-04-13 NOTE — Therapy (Signed)
 OUTPATIENT PHYSICAL THERAPY NEURO EVALUATION   Patient Name: Kelli Hudson MRN: [id number] DOB:[birth year], 33 y.o., female Today's Date: 04/13/2023   PCP: Cristino Martes, NP REFERRING PROVIDER: Aldean Baker, MD  END OF SESSION:  PT End of Session - 04/13/23 1306     Visit Number 1    Number of Visits 25    Date for PT Re-Evaluation 07/10/23    Authorization Type Medicare/Medicaid    Progress Note Due on Visit 10    PT Start Time 1315    PT Stop Time 1350    PT Time Calculation (min) 35 min    Equipment Utilized During Treatment Gait belt    Behavior During Therapy WFL for tasks assessed/performed             Past Medical History:  Diagnosis Date   Acute H. pylori gastric ulcer    Coffee ground emesis    Diabetes mellitus (HCC)    Diabetic gastroparesis (HCC)    DKA (diabetic ketoacidosis) (HCC) 02/24/2021   Gastroparesis    GERD (gastroesophageal reflux disease)    Hypertension    Hyperthyroidism    Intractable nausea and vomiting 04/20/2021   Normocytic anemia 04/20/2020   Prolonged Q-T interval on ECG    Past Surgical History:  Procedure Laterality Date   AMPUTATION Bilateral 10/24/2022   Procedure: BILATERAL BELOW KNEE AMPUTATION;  Surgeon: Nadara Mustard, MD;  Location: [facility] OR;  Service: Orthopedics;  Laterality: Bilateral;   AMPUTATION Bilateral 10/08/2022   Procedure: BILATERAL LEG DEBRIDEMENT;  Surgeon: Nadara Mustard, MD;  Location: [facility] OR;  Service: Orthopedics;  Laterality: Bilateral;   AMPUTATION Bilateral 11/14/2022   Procedure: BILATERAL ABOVE KNEE AMPUTATION;  Surgeon: Nadara Mustard, MD;  Location: [facility] OR;  Service: Orthopedics;  Laterality: Bilateral;   AMPUTATION TOE Left 03/10/2018   Procedure: AMPUTATION FIFTH TOE;  Surgeon: Vivi Barrack, DPM;  Location: [facility];  Service: Podiatry;  Laterality: Left;   APPLICATION OF WOUND VAC Bilateral 10/12/2022   Procedure: APPLICATION OF WOUND VAC BILATERAL LEGS;  Surgeon:  Myrene Galas, MD;  Location: [facility] OR;  Service: Orthopedics;  Laterality: Bilateral;   APPLICATION OF WOUND VAC Bilateral 10/24/2022   Procedure: APPLICATION OF WOUND VAC;  Surgeon: Nadara Mustard, MD;  Location: [facility] OR;  Service: Orthopedics;  Laterality: Bilateral;   BIOPSY  01/28/2020   Procedure: BIOPSY;  Surgeon: Kathi Der, MD;  Location: [facility];  Service: Gastroenterology;;   BOTOX INJECTION  08/26/2021   Procedure: BOTOX INJECTION;  Surgeon: Sherrilyn Rist, MD;  Location: [facility];  Service: Gastroenterology;;   ESOPHAGOGASTRODUODENOSCOPY N/A 01/28/2020   Procedure: ESOPHAGOGASTRODUODENOSCOPY (EGD);  Surgeon: Kathi Der, MD;  Location: [facility];  Service: Gastroenterology;  Laterality: N/A;   ESOPHAGOGASTRODUODENOSCOPY (EGD) WITH PROPOFOL Left 09/08/2015   Procedure: ESOPHAGOGASTRODUODENOSCOPY (EGD) WITH PROPOFOL;  Surgeon: Willis Modena, MD;  Location: [facility] ENDOSCOPY;  Service: Endoscopy;  Laterality: Left;   ESOPHAGOGASTRODUODENOSCOPY (EGD) WITH PROPOFOL N/A 08/26/2021   Procedure: ESOPHAGOGASTRODUODENOSCOPY (EGD) WITH PROPOFOL;  Surgeon: Sherrilyn Rist, MD;  Location: [facility];  Service: Gastroenterology;  Laterality: N/A;   GASTRIC STIMULATOR IMPLANT SURGERY  06/2022   at [facility]   I & D EXTREMITY Bilateral 10/12/2022   Procedure: IRRIGATION AND  DEBRIDEMENT  BILATERAL LEGS;  Surgeon: Myrene Galas, MD;  Location: [facility] OR;  Service: Orthopedics;  Laterality: Bilateral;   I & D EXTREMITY Right 10/13/2022   Procedure: RIGHT THIGH WOUND VAC EXCHANGE;  Surgeon: Roby Lofts, MD;  Location: [facility] OR;  Service: Orthopedics;  Laterality: Right;   I & D EXTREMITY Bilateral 10/17/2022   Procedure: BILATERAL THIGH AND LEG DEBRIDEMENT, PARTIAL BILATERAL FOOT AMPUTATION;  Surgeon: Nadara Mustard, MD;  Location: [facility] OR;  Service: Orthopedics;  Laterality: Bilateral;   I & D EXTREMITY Bilateral 11/05/2022   Procedure: BILATERAL THIGH DEBRIDEMENT;  Surgeon: Nadara Mustard, MD;  Location: [facility] OR;  Service: Orthopedics;  Laterality: Bilateral;   I & D EXTREMITY Bilateral 11/07/2022   Procedure: BILATERAL THIGH AND LEG DEBRIDEMENT;  Surgeon: Nadara Mustard, MD;  Location: [facility] OR;  Service: Orthopedics;  Laterality: Bilateral;   I & D EXTREMITY Bilateral 11/12/2022   Procedure: BILATERAL LEG DEBRIDEMENTS;  Surgeon: Nadara Mustard, MD;  Location: [facility] OR;  Service: Orthopedics;  Laterality: Bilateral;   IRRIGATION AND DEBRIDEMENT FOOT Bilateral 10/05/2022   Procedure: IRRIGATION AND DEBRIDEMENT FOOT;  Surgeon: [name], DPM;  Location: [facility]S;  Service: Orthopedics/Podiatry;  Laterality: Bilateral;   WISDOM TOOTH EXTRACTION     Patient Active Problem List   Diagnosis Date Noted   Influenza A 04/05/2023   Elevated lipase 04/05/2023   Esophagitis 03/24/2023   SIRS (systemic inflammatory response syndrome) (HCC) 03/02/2023   Fever 03/02/2023   Metabolic acidosis 03/02/2023   Hypertensive urgency 02/21/2023   Chronic pain 02/21/2023   Abscess of thigh 11/28/2022   C. 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   Hypokalemia 11/27/2022   Effusion, left knee 11/05/2022   MDD (major depressive disorder), recurrent episode, moderate (HCC) 10/16/2022   Necrotizing fasciitis of pelvic region and thigh (HCC) 10/08/2022   Necrotizing fasciitis of lower leg (HCC) 10/08/2022   Necrotizing fasciitis (HCC) 10/08/2022   Sepsis due to cellulitis (HCC) 10/03/2022   Cellulitis of lower extremity 10/03/2022   Multinodular goiter 06/18/2022   Malnutrition of moderate degree 04/18/2022   Intractable vomiting with nausea 04/17/2022   DKA (diabetic ketoacidosis) (HCC) 03/04/2022   Nausea vomiting and diarrhea 12/25/2021   Diabetic gastroparesis (HCC) 10/09/2021   Drug-seeking behavior 09/21/2021   Essential hypertension 08/25/2021   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) 08/25/2021   GERD without esophagitis  08/25/2021   Hematemesis    Prolonged Q-T interval on ECG    Nausea and vomiting 07/20/2021   Anemia of chronic disease 06/06/2021   Dehydration 06/04/2021   Hypomagnesemia 04/21/2021   Ulcerated, foot, left, with fat layer exposed (HCC) 04/20/2021   Uncontrolled type 1 diabetes mellitus with hyperglycemia, with long-term current use of insulin (HCC) 12/18/2020   DM type 1 (diabetes mellitus, type 1) (HCC) 12/18/2020   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) 11/09/2020   Diabetic retinopathy of both eyes associated with type 1 diabetes mellitus (HCC) 09/24/2020   Diabetic gastroparesis associated with type 1 diabetes mellitus (HCC) 08/12/2020   Acute kidney injury superimposed on chronic kidney disease (HCC) 07/25/2020   Generalized abdominal pain 07/23/2020   GERD (gastroesophageal reflux disease) 07/23/2020   Diabetic nephropathy associated with type 1 diabetes mellitus (HCC) 06/27/2020   Sepsis (HCC) 05/27/2020   HLD (hyperlipidemia) 05/23/2020   Sinus tachycardia 05/09/2020   Anemia 04/20/2020   Depression with anxiety 07/05/2019   Diabetic polyneuropathy associated with type 1 diabetes mellitus (HCC) 09/24/2017   Gastroparesis    Goiter 10/05/2009   DM2 (diabetes mellitus, type 2) (HCC) 10/05/2009   Hypertension associated with diabetes (HCC) 10/05/2009    ONSET DATE: 04/07/23 referral  REFERRING DIAG: [phone] (ICD-10-CM) - S/P AKA (above knee amputation) bilateral (HCC)   THERAPY DIAG:  Abnormal posture - Plan: PT plan of care cert/re-cert  Muscle weakness (generalized) - Plan: PT plan of care cert/re-cert  Other abnormalities of gait and mobility - Plan: PT plan of care cert/re-cert  Rationale for Evaluation and Treatment: Rehabilitation  SUBJECTIVE:                                                                                                                                                                                              SUBJECTIVE STATEMENT: Patient arrives to clinic in manual wc with best friend. Received her stubbies ~10 days ago. Has only stood/walked in them at [facility], not at home. Still in standard wc with off the shelf cushion. Per patient, BG is 218 at start of session.  Pt accompanied by: friend  PERTINENT HISTORY: DM, diabetic gastroparesis, GERD, HTN, hyperthyroidism  PAIN:  Are you having pain? No  PRECAUTIONS: Fall; B diabetic retinopathy   WEIGHT BEARING RESTRICTIONS: No  FALLS: Has patient fallen in last 6 months? No  LIVING ENVIRONMENT: Lives with: lives alone Lives in: House/apartment Stairs: No Has following equipment at home: Wheelchair (manual) and shower chair; aide 7 days/week for 4 hours  PLOF: Independent  PATIENT GOALS: "to be able to walk again"  OBJECTIVE:  Note: Objective measures were completed at Evaluation unless otherwise noted.  DIAGNOSTIC FINDINGS: non contributory   COGNITION: Overall cognitive status: Within functional limits for tasks assessed   SENSATION: WFL   POSTURE: rounded shoulders, forward head, posterior pelvic tilt, and flexed trunk    LOWER EXTREMITY MMT:    MMT Right Eval Left Eval  Hip flexion 3 3  Hip extension    Hip abduction 3 3  Hip adduction 3 3  Hip internal rotation    Hip external rotation    Knee flexion    Knee extension    Ankle dorsiflexion    Ankle plantarflexion    Ankle inversion    Ankle eversion    (Blank rows = not tested)  BED MOBILITY:  independent  TRANSFERS: Assistive device utilized: None  Sit to stand: Min A Stand to sit: Min A Chair to chair: SBA  GAIT: Gait pattern: decreased stride length, trunk flexed, wide BOS, abducted- Right, abducted- Left, poor foot clearance- Right, and poor foot clearance- Left Distance walked: 2x10' Assistive device utilized:  // bars Level of assistance: Min A Comments: + wc follow  TREATMENT Self care/home management: -donning/doffing prosthetics -anticipated AD progression  GAIT: -2x10' in // bars with emphasis on adequate foot clearance and return to midline stance for increased stability   PATIENT EDUCATION: Education details: see above, exam findings, PT POC Person educated: Patient and friend Education method: Medical illustrator Education comprehension: verbalized understanding and needs further education  HOME EXERCISE PROGRAM: -practice donning/doffing prosthetic  GOALS: Goals reviewed with patient? Yes  SHORT TERM GOALS: Target date: 05/15/23  Pt will be independent with initial HEP for improved gait mechanics and balance  Baseline: to be updated Goal status: INITIAL  2.  Patient will ambulate at least 38ft with LRAD and no more than MinA to demonstrate improved mobility  Baseline: 29ft // bars + MinA Goal status: INITIAL  3.  Patient will complete sit <> stand with LRAD and no more than CGA for improved independence with transfers Baseline: MinA Goal status: INITIAL  4.  Patient will complete floor transfer with assist as initial step of fall recovery  Baseline: unable to complete on eval Goal status: INITIAL   LONG TERM GOALS: Target date: 07/10/23  Pt will be independent with final HEP for improved gait mechanics and balance  Baseline: to be updated Goal status: INITIAL  Patient will ambulate at least 89ft with LRAD, ModI to demonstrate improved mobility  Baseline: 84ft // bars + MinA Goal status: INITIAL  Patient will complete sit <> stand with LRAD. ModI for improved independence with transfers Baseline: MinA Goal status: INITIAL  Patient will complete floor transfer without assist as initial step of fall recovery  Baseline: unable to complete on eval Goal status: INITIAL  5. Patient will don/doff prosthetics independently   Baseline: MaxA  Goal status:  INITIAL  ASSESSMENT:  CLINICAL IMPRESSION: Patient is a 33 y.o. female who was seen today for physical therapy evaluation and treatment for prosthetic training s/p B AKA. She currently requires grossly MaxA to don/doff prosthetics, mainly due to her impaired vision and fine motor coordination (OT ordered). She Requires grossly MinA to complete sit <> stand in // bars. Increased assist with returning to her wc due to the height of the seat vs the height of her current prosthetics. When ambulating, she's primarily weight bearing on the medial edges resulting in increased instability. She would benefit from skilled PT services to address the above mentioned deficits.    OBJECTIVE IMPAIRMENTS: Abnormal gait, decreased activity tolerance, decreased balance, decreased endurance, decreased knowledge of condition, decreased knowledge of use of DME, decreased strength, impaired vision/preception, and prosthetic dependency .   ACTIVITY LIMITATIONS: carrying, lifting, standing, squatting, transfers, hygiene/grooming, locomotion level, and caring for others  PARTICIPATION LIMITATIONS: meal prep, cleaning, interpersonal relationship, driving, shopping, community activity, and occupation  PERSONAL FACTORS: Age, Behavior pattern, Past/current experiences, Social background, Time since onset of injury/illness/exacerbation, Transportation, and 3+ comorbidities: see above  are also affecting patient's functional outcome.   REHAB POTENTIAL: Good  CLINICAL DECISION MAKING: Stable/uncomplicated  EVALUATION COMPLEXITY: Low  PLAN:  PT FREQUENCY: 2x/week  PT DURATION: 12 weeks  PLANNED INTERVENTIONS: 97164- PT Re-evaluation, 97110-Therapeutic exercises, 97530- Therapeutic activity, 97112- Neuromuscular re-education, 97535- Self Care, 16109- Manual therapy, [phone]- Gait training, [phone]- Prosthetic training, [phone]- Aquatic Therapy, Patient/Family education, Balance training, Stair training, Scar mobilization,  Vestibular training, Visual/preceptual remediation/compensation, DME instructions, and Wheelchair mobility training  PLAN FOR NEXT SESSION: HEP, gait, balance, floor transfers, donning/doffing prosthetics   Westley Foots, PT Westley Foots, PT, DPT, CBIS  04/13/2023, 2:11 PM

## 2023-04-16 ENCOUNTER — Ambulatory Visit: Payer: 59

## 2023-04-16 ENCOUNTER — Other Ambulatory Visit: Payer: Self-pay

## 2023-04-16 VITALS — BP 132/80 | HR 89

## 2023-04-16 DIAGNOSIS — R293 Abnormal posture: Secondary | ICD-10-CM

## 2023-04-16 DIAGNOSIS — R2689 Other abnormalities of gait and mobility: Secondary | ICD-10-CM

## 2023-04-16 DIAGNOSIS — M6281 Muscle weakness (generalized): Secondary | ICD-10-CM

## 2023-04-16 DIAGNOSIS — Z89611 Acquired absence of right leg above knee: Secondary | ICD-10-CM

## 2023-04-16 NOTE — Therapy (Signed)
 OUTPATIENT PHYSICAL THERAPY NEURO TREATMENT   Patient Name: Kelli Hudson MRN: [id number] DOB:[birth year], 33 y.o., female Today's Date: 04/16/2023   PCP: Cristino Martes, NP REFERRING PROVIDER: Aldean Baker, MD  END OF SESSION:  PT End of Session - 04/16/23 1312     Visit Number 2    Number of Visits 25    Date for PT Re-Evaluation 07/10/23    Authorization Type Medicare/Medicaid    Progress Note Due on Visit 10    PT Start Time 1315    PT Stop Time 1409    PT Time Calculation (min) 54 min    Equipment Utilized During Treatment Gait belt    Activity Tolerance Patient tolerated treatment well    Behavior During Therapy WFL for tasks assessed/performed             Past Medical History:  Diagnosis Date   Acute H. pylori gastric ulcer    Coffee ground emesis    Diabetes mellitus (HCC)    Diabetic gastroparesis (HCC)    DKA (diabetic ketoacidosis) (HCC) 02/24/2021   Gastroparesis    GERD (gastroesophageal reflux disease)    Hypertension    Hyperthyroidism    Intractable nausea and vomiting 04/20/2021   Normocytic anemia 04/20/2020   Prolonged Q-T interval on ECG    Past Surgical History:  Procedure Laterality Date   AMPUTATION Bilateral 10/24/2022   Procedure: BILATERAL BELOW KNEE AMPUTATION;  Surgeon: Nadara Mustard, MD;  Location: [facility] OR;  Service: Orthopedics;  Laterality: Bilateral;   AMPUTATION Bilateral 10/08/2022   Procedure: BILATERAL LEG DEBRIDEMENT;  Surgeon: Nadara Mustard, MD;  Location: [facility] OR;  Service: Orthopedics;  Laterality: Bilateral;   AMPUTATION Bilateral 11/14/2022   Procedure: BILATERAL ABOVE KNEE AMPUTATION;  Surgeon: Nadara Mustard, MD;  Location: [facility] OR;  Service: Orthopedics;  Laterality: Bilateral;   AMPUTATION TOE Left 03/10/2018   Procedure: AMPUTATION FIFTH TOE;  Surgeon: Vivi Barrack, DPM;  Location: [facility];  Service: Podiatry;  Laterality: Left;   APPLICATION OF WOUND VAC Bilateral 10/12/2022    Procedure: APPLICATION OF WOUND VAC BILATERAL LEGS;  Surgeon: Myrene Galas, MD;  Location: [facility] OR;  Service: Orthopedics;  Laterality: Bilateral;   APPLICATION OF WOUND VAC Bilateral 10/24/2022   Procedure: APPLICATION OF WOUND VAC;  Surgeon: Nadara Mustard, MD;  Location: [facility] OR;  Service: Orthopedics;  Laterality: Bilateral;   BIOPSY  01/28/2020   Procedure: BIOPSY;  Surgeon: Kathi Der, MD;  Location: [facility];  Service: Gastroenterology;;   BOTOX INJECTION  08/26/2021   Procedure: BOTOX INJECTION;  Surgeon: Sherrilyn Rist, MD;  Location: [facility];  Service: Gastroenterology;;   ESOPHAGOGASTRODUODENOSCOPY N/A 01/28/2020   Procedure: ESOPHAGOGASTRODUODENOSCOPY (EGD);  Surgeon: Kathi Der, MD;  Location: [facility];  Service: Gastroenterology;  Laterality: N/A;   ESOPHAGOGASTRODUODENOSCOPY (EGD) WITH PROPOFOL Left 09/08/2015   Procedure: ESOPHAGOGASTRODUODENOSCOPY (EGD) WITH PROPOFOL;  Surgeon: Willis Modena, MD;  Location: [facility] END[facility]COPY;  Service: Endoscopy;  Laterality: Left;   ESOPHAGOGASTRODUODENOSCOPY (EGD) WITH PROPOFOL N/A 08/26/2021   Procedure: ESOPHAGOGASTRODUODENOSCOPY (EGD) WITH PROPOFOL;  Surgeon: Sherrilyn Rist, MD;  Location: [facility];  Service: Gastroenterology;  Laterality: N/A;   GASTRIC STIMULATOR IMPLANT SURGERY  06/2022   at [facility]   I & D EXTREMITY Bilateral 10/12/2022   Procedure: IRRIGATION AND  DEBRIDEMENT  BILATERAL LEGS;  Surgeon: Myrene Galas, MD;  Location: [facility] OR;  Service: Orthopedics;  Laterality: Bilateral;   I & D EXTREMITY Right 10/13/2022   Procedure: RIGHT THIGH WOUND  VAC EXCHANGE;  Surgeon: Roby Lofts, MD;  Location: [facility] OR;  Service: Orthopedics;  Laterality: Right;   I & D EXTREMITY Bilateral 10/17/2022   Procedure: BILATERAL THIGH AND LEG DEBRIDEMENT, PARTIAL BILATERAL FOOT AMPUTATION;  Surgeon: Nadara Mustard, MD;  Location: [facility] OR;  Service: Orthopedics;  Laterality: Bilateral;   I & D EXTREMITY Bilateral 11/05/2022    Procedure: BILATERAL THIGH DEBRIDEMENT;  Surgeon: Nadara Mustard, MD;  Location: [facility] OR;  Service: Orthopedics;  Laterality: Bilateral;   I & D EXTREMITY Bilateral 11/07/2022   Procedure: BILATERAL THIGH AND LEG DEBRIDEMENT;  Surgeon: Nadara Mustard, MD;  Location: [facility] OR;  Service: Orthopedics;  Laterality: Bilateral;   I & D EXTREMITY Bilateral 11/12/2022   Procedure: BILATERAL LEG DEBRIDEMENTS;  Surgeon: Nadara Mustard, MD;  Location: [facility] OR;  Service: Orthopedics;  Laterality: Bilateral;   IRRIGATION AND DEBRIDEMENT FOOT Bilateral 10/05/2022   Procedure: IRRIGATION AND DEBRIDEMENT FOOT;  Surgeon: [name], DPM;  Location: [facility];  Service: Orthopedics/Podiatry;  Laterality: Bilateral;   WISDOM TOOTH EXTRACTION     Patient Active Problem List   Diagnosis Date Noted   Influenza A 04/05/2023   Elevated lipase 04/05/2023   Esophagitis 03/24/2023   SIRS (systemic inflammatory response syndrome) (HCC) 03/02/2023   Fever 03/02/2023   Metabolic acidosis 03/02/2023   Hypertensive urgency 02/21/2023   Chronic pain 02/21/2023   Abscess of thigh 11/28/2022   C. 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   Hypokalemia 11/27/2022   Effusion, left knee 11/05/2022   MDD (major depressive disorder), recurrent episode, moderate (HCC) 10/16/2022   Necrotizing fasciitis of pelvic region and thigh (HCC) 10/08/2022   Necrotizing fasciitis of lower leg (HCC) 10/08/2022   Necrotizing fasciitis (HCC) 10/08/2022   Sepsis due to cellulitis (HCC) 10/03/2022   Cellulitis of lower extremity 10/03/2022   Multinodular goiter 06/18/2022   Malnutrition of moderate degree 04/18/2022   Intractable vomiting with nausea 04/17/2022   DKA (diabetic ketoacidosis) (HCC) 03/04/2022   Nausea vomiting and diarrhea 12/25/2021   Diabetic gastroparesis (HCC) 10/09/2021   Drug-seeking behavior 09/21/2021   Essential hypertension 08/25/2021   Diabetes mellitus type 1 with  complications (HCC) 08/25/2021   GERD without esophagitis 08/25/2021   Hematemesis    Prolonged Q-T interval on ECG    Nausea and vomiting 07/20/2021   Anemia of chronic disease 06/06/2021   Dehydration 06/04/2021   Hypomagnesemia 04/21/2021   Ulcerated, foot, left, with fat layer exposed (HCC) 04/20/2021   Uncontrolled type 1 diabetes mellitus with hyperglycemia, with long-term current use of insulin (HCC) 12/18/2020   DM type 1 (diabetes mellitus, type 1) (HCC) 12/18/2020   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) 11/09/2020   Diabetic retinopathy of both eyes associated with type 1 diabetes mellitus (HCC) 09/24/2020   Diabetic gastroparesis associated with type 1 diabetes mellitus (HCC) 08/12/2020   Acute kidney injury superimposed on chronic kidney disease (HCC) 07/25/2020   Generalized abdominal pain 07/23/2020   GERD (gastroesophageal reflux disease) 07/23/2020   Diabetic nephropathy associated with type 1 diabetes mellitus (HCC) 06/27/2020   Sepsis (HCC) 05/27/2020   HLD (hyperlipidemia) 05/23/2020   Sinus tachycardia 05/09/2020   Anemia 04/20/2020   Depression with anxiety 07/05/2019   Diabetic polyneuropathy associated with type 1 diabetes mellitus (HCC) 09/24/2017   Gastroparesis    Goiter 10/05/2009   DM2 (diabetes mellitus, type 2) (HCC) 10/05/2009   Hypertension associated with diabetes (HCC)  10/05/2009    ONSET DATE: 04/07/23 referral  REFERRING DIAG: [phone] (ICD-10-CM) - S/P AKA (above knee amputation) bilateral (HCC)   THERAPY DIAG:  Abnormal posture  Muscle weakness (generalized)  Other abnormalities of gait and mobility  Rationale for Evaluation and Treatment: Rehabilitation  SUBJECTIVE:                                                                                                                                                                                             SUBJECTIVE STATEMENT: Patient arrives to clinic  in manual wc. BG 280. Did try donning prosthetics, able to thread R, but not L. Denies falls.  Pt accompanied by: friend  PERTINENT HISTORY: DM, diabetic gastroparesis, GERD, HTN, hyperthyroidism  PAIN:  Are you having pain? No  VITALS: Vitals:   04/16/23 1319  BP: 132/80  Pulse: 89     PRECAUTIONS: Fall; B diabetic retinopathy  PATIENT GOALS: "to be able to walk again"                                                                                                                              TREATMENT Self care/home management: -donned/doffed prosthetics x2   -initially MaxA, progressed to supervision with verbal cues -2nd PT lowered patients wc height for easier transfers in and out of wc  GAIT/PRE-GAIT -standing in // bars on 4" box for increased height back into wc  -static standing B UE support, U UE support, no UE support  -progressed to U UE support dynamic task reaching to touch PT hand  -10' x2 in // bars with CGA + wc follow for safety   -improved gait mechanics with appropriate circumduction and prosthetic foot returning to appropriate step width -stood to RW with CGA   -lateral weight shifts with B UE support-> no UE support (unable to advance to gait due to B prosthetic feet sticking to tile floor)  Theract: -A/P transfer to mat from clinic wc-> a/p transfer back to her lowered personal wc   PATIENT EDUCATION: Education details: continue practicing donning/doffing prosthetics  Person educated:  Patient and friend Education method: Explanation and Demonstration Education comprehension: verbalized understanding and needs further education  HOME EXERCISE PROGRAM: -practice donning/doffing prosthetic  GOALS: Goals reviewed with patient? Yes  SHORT TERM GOALS: Target date: 05/15/23  Pt will be independent with initial HEP for improved gait mechanics and balance  Baseline: to be updated Goal status: INITIAL  2.  Patient will ambulate at least 69ft with  LRAD and no more than MinA to demonstrate improved mobility  Baseline: 44ft // bars + MinA Goal status: INITIAL  3.  Patient will complete sit <> stand with LRAD and no more than CGA for improved independence with transfers Baseline: MinA Goal status: INITIAL  4.  Patient will complete floor transfer with assist as initial step of fall recovery  Baseline: unable to complete on eval Goal status: INITIAL   LONG TERM GOALS: Target date: 07/10/23  Pt will be independent with final HEP for improved gait mechanics and balance  Baseline: to be updated Goal status: INITIAL  Patient will ambulate at least 64ft with LRAD, ModI to demonstrate improved mobility  Baseline: 20ft // bars + MinA Goal status: INITIAL  Patient will complete sit <> stand with LRAD. ModI for improved independence with transfers Baseline: MinA Goal status: INITIAL  Patient will complete floor transfer without assist as initial step of fall recovery  Baseline: unable to complete on eval Goal status: INITIAL  5. Patient will don/doff prosthetics independently   Baseline: MaxA  Goal status: INITIAL  ASSESSMENT:  CLINICAL IMPRESSION: Patient seen for skilled PT session with emphasis on initiating gait training with B stubbie prosthetics. Due to patients low vision, she relies on touch for threading prosthetic lanyard- with hand over hand assist, patient ultimately able to do it with supervision and verbal cuing. She appears to be sitting a little higher in her L prosthetic and mentioned feeling as though she "settled" into her R prosthetic partway through // bar tasks. Would benefit from further education on sock management. She does require essentially totalA to sit back into her wc, but that is likely due to the height of the wheelchair. Patient likely able to ambulate with RW and stubbies, but rubber on bottom of prosthetic feet were sticking to the tile floor. PT questioning suction vs inadequate weight shift.  Continue POC.   OBJECTIVE IMPAIRMENTS: Abnormal gait, decreased activity tolerance, decreased balance, decreased endurance, decreased knowledge of condition, decreased knowledge of use of DME, decreased strength, impaired vision/preception, and prosthetic dependency .   ACTIVITY LIMITATIONS: carrying, lifting, standing, squatting, transfers, hygiene/grooming, locomotion level, and caring for others  PARTICIPATION LIMITATIONS: meal prep, cleaning, interpersonal relationship, driving, shopping, community activity, and occupation  PERSONAL FACTORS: Age, Behavior pattern, Past/current experiences, Social background, Time since onset of injury/illness/exacerbation, Transportation, and 3+ comorbidities: see above  are also affecting patient's functional outcome.   REHAB POTENTIAL: Good  CLINICAL DECISION MAKING: Stable/uncomplicated  EVALUATION COMPLEXITY: Low  PLAN:  PT FREQUENCY: 2x/week  PT DURATION: 12 weeks  PLANNED INTERVENTIONS: 97164- PT Re-evaluation, 97110-Therapeutic exercises, 97530- Therapeutic activity, 97112- Neuromuscular re-education, 97535- Self Care, 32440- Manual therapy, [phone]- Gait training, [phone]- Prosthetic training, [phone]- Aquatic Therapy, Patient/Family education, Balance training, Stair training, Scar mobilization, Vestibular training, Visual/preceptual remediation/compensation, DME instructions, and Wheelchair mobility training  PLAN FOR NEXT SESSION: HEP, gait, balance, floor transfers, donning/doffing prosthetics   Westley Foots, PT Westley Foots, PT, DPT, CBIS  04/16/2023, 3:01 PM

## 2023-04-20 ENCOUNTER — Ambulatory Visit: Payer: 59 | Attending: Orthopedic Surgery

## 2023-04-20 VITALS — BP 141/90 | HR 94

## 2023-04-20 DIAGNOSIS — R29898 Other symptoms and signs involving the musculoskeletal system: Secondary | ICD-10-CM | POA: Insufficient documentation

## 2023-04-20 DIAGNOSIS — M6281 Muscle weakness (generalized): Secondary | ICD-10-CM | POA: Insufficient documentation

## 2023-04-20 DIAGNOSIS — R293 Abnormal posture: Secondary | ICD-10-CM | POA: Diagnosis present

## 2023-04-20 DIAGNOSIS — R2689 Other abnormalities of gait and mobility: Secondary | ICD-10-CM | POA: Diagnosis present

## 2023-04-20 NOTE — Therapy (Signed)
 OUTPATIENT PHYSICAL THERAPY NEURO TREATMENT   Patient Name: Kelli Hudson MRN: [id number] DOB:04-14-1990, 33 y.o., female Today's Date: 04/20/2023   PCP: Cristino Martes, NP REFERRING PROVIDER: Aldean Baker, MD  END OF SESSION:  PT End of Session - 04/20/23 1312     Visit Number 3    Number of Visits 25    Date for PT Re-Evaluation 07/10/23    Authorization Type Medicare/Medicaid    Progress Note Due on Visit 10    PT Start Time 1315    PT Stop Time 1356    PT Time Calculation (min) 41 min    Equipment Utilized During Treatment Gait belt    Activity Tolerance Patient tolerated treatment well    Behavior During Therapy WFL for tasks assessed/performed             Past Medical History:  Diagnosis Date   Acute H. pylori gastric ulcer    Coffee ground emesis    Diabetes mellitus (HCC)    Diabetic gastroparesis (HCC)    DKA (diabetic ketoacidosis) (HCC) 02/24/2021   Gastroparesis    GERD (gastroesophageal reflux disease)    Hypertension    Hyperthyroidism    Intractable nausea and vomiting 04/20/2021   Normocytic anemia 04/20/2020   Prolonged Q-T interval on ECG    Past Surgical History:  Procedure Laterality Date   AMPUTATION Bilateral 10/24/2022   Procedure: BILATERAL BELOW KNEE AMPUTATION;  Surgeon: Nadara Mustard, MD;  Location: [facility] OR;  Service: Orthopedics;  Laterality: Bilateral;   AMPUTATION Bilateral 10/08/2022   Procedure: BILATERAL LEG DEBRIDEMENT;  Surgeon: Nadara Mustard, MD;  Location: [facility] OR;  Service: Orthopedics;  Laterality: Bilateral;   AMPUTATION Bilateral 11/14/2022   Procedure: BILATERAL ABOVE KNEE AMPUTATION;  Surgeon: Nadara Mustard, MD;  Location: [facility] OR;  Service: Orthopedics;  Laterality: Bilateral;   AMPUTATION TOE Left 03/10/2018   Procedure: AMPUTATION FIFTH TOE;  Surgeon: Vivi Barrack, DPM;  Location: [facility];  Service: Podiatry;  Laterality: Left;   APPLICATION OF WOUND VAC Bilateral 10/12/2022    Procedure: APPLICATION OF WOUND VAC BILATERAL LEGS;  Surgeon: Myrene Galas, MD;  Location: [facility] OR;  Service: Orthopedics;  Laterality: Bilateral;   APPLICATION OF WOUND VAC Bilateral 10/24/2022   Procedure: APPLICATION OF WOUND VAC;  Surgeon: Nadara Mustard, MD;  Location: [facility] OR;  Service: Orthopedics;  Laterality: Bilateral;   BIOPSY  01/28/2020   Procedure: BIOPSY;  Surgeon: Kathi Der, MD;  Location: [facility];  Service: Gastroenterology;;   BOTOX INJECTION  08/26/2021   Procedure: BOTOX INJECTION;  Surgeon: Sherrilyn Rist, MD;  Location: [facility];  Service: Gastroenterology;;   ESOPHAGOGASTRODUODENOSCOPY N/A 01/28/2020   Procedure: ESOPHAGOGASTRODUODENOSCOPY (EGD);  Surgeon: Kathi Der, MD;  Location: [facility];  Service: Gastroenterology;  Laterality: N/A;   ESOPHAGOGASTRODUODENOSCOPY (EGD) WITH PROPOFOL Left 09/08/2015   Procedure: ESOPHAGOGASTRODUODENOSCOPY (EGD) WITH PROPOFOL;  Surgeon: Willis Modena, MD;  Location: [facility] END[facility]COPY;  Service: Endoscopy;  Laterality: Left;   ESOPHAGOGASTRODUODENOSCOPY (EGD) WITH PROPOFOL N/A 08/26/2021   Procedure: ESOPHAGOGASTRODUODENOSCOPY (EGD) WITH PROPOFOL;  Surgeon: Sherrilyn Rist, MD;  Location: [facility];  Service: Gastroenterology;  Laterality: N/A;   GASTRIC STIMULATOR IMPLANT SURGERY  06/2022   at [facility]   I & D EXTREMITY Bilateral 10/12/2022   Procedure: IRRIGATION AND  DEBRIDEMENT  BILATERAL LEGS;  Surgeon: Myrene Galas, MD;  Location: [facility] OR;  Service: Orthopedics;  Laterality: Bilateral;   I & D EXTREMITY Right 10/13/2022   Procedure: RIGHT THIGH WOUND  VAC EXCHANGE;  Surgeon: Roby Lofts, MD;  Location: [facility] OR;  Service: Orthopedics;  Laterality: Right;   I & D EXTREMITY Bilateral 10/17/2022   Procedure: BILATERAL THIGH AND LEG DEBRIDEMENT, PARTIAL BILATERAL FOOT AMPUTATION;  Surgeon: Nadara Mustard, MD;  Location: [facility] OR;  Service: Orthopedics;  Laterality: Bilateral;   I & D EXTREMITY Bilateral 11/05/2022    Procedure: BILATERAL THIGH DEBRIDEMENT;  Surgeon: Nadara Mustard, MD;  Location: [facility] OR;  Service: Orthopedics;  Laterality: Bilateral;   I & D EXTREMITY Bilateral 11/07/2022   Procedure: BILATERAL THIGH AND LEG DEBRIDEMENT;  Surgeon: Nadara Mustard, MD;  Location: [facility] OR;  Service: Orthopedics;  Laterality: Bilateral;   I & D EXTREMITY Bilateral 11/12/2022   Procedure: BILATERAL LEG DEBRIDEMENTS;  Surgeon: Nadara Mustard, MD;  Location: [facility] OR;  Service: Orthopedics;  Laterality: Bilateral;   IRRIGATION AND DEBRIDEMENT FOOT Bilateral 10/05/2022   Procedure: IRRIGATION AND DEBRIDEMENT FOOT;  Surgeon: [name], DPM;  Location: [facility];  Service: Orthopedics/Podiatry;  Laterality: Bilateral;   WISDOM TOOTH EXTRACTION     Patient Active Problem List   Diagnosis Date Noted   Influenza A 04/05/2023   Elevated lipase 04/05/2023   Esophagitis 03/24/2023   SIRS (systemic inflammatory response syndrome) (HCC) 03/02/2023   Fever 03/02/2023   Metabolic acidosis 03/02/2023   Hypertensive urgency 02/21/2023   Chronic pain 02/21/2023   Abscess of thigh 11/28/2022   C. 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   Hypokalemia 11/27/2022   Effusion, left knee 11/05/2022   MDD (major depressive disorder), recurrent episode, moderate (HCC) 10/16/2022   Necrotizing fasciitis of pelvic region and thigh (HCC) 10/08/2022   Necrotizing fasciitis of lower leg (HCC) 10/08/2022   Necrotizing fasciitis (HCC) 10/08/2022   Sepsis due to cellulitis (HCC) 10/03/2022   Cellulitis of lower extremity 10/03/2022   Multinodular goiter 06/18/2022   Malnutrition of moderate degree 04/18/2022   Intractable vomiting with nausea 04/17/2022   DKA (diabetic ketoacidosis) (HCC) 03/04/2022   Nausea vomiting and diarrhea 12/25/2021   Diabetic gastroparesis (HCC) 10/09/2021   Drug-seeking behavior 09/21/2021   Essential hypertension 08/25/2021   Diabetes mellitus type 1 with  complications (HCC) 08/25/2021   GERD without esophagitis 08/25/2021   Hematemesis    Prolonged Q-T interval on ECG    Nausea and vomiting 07/20/2021   Anemia of chronic disease 06/06/2021   Dehydration 06/04/2021   Hypomagnesemia 04/21/2021   Ulcerated, foot, left, with fat layer exposed (HCC) 04/20/2021   Uncontrolled type 1 diabetes mellitus with hyperglycemia, with long-term current use of insulin (HCC) 12/18/2020   DM type 1 (diabetes mellitus, type 1) (HCC) 12/18/2020   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) 11/09/2020   Diabetic retinopathy of both eyes associated with type 1 diabetes mellitus (HCC) 09/24/2020   Diabetic gastroparesis associated with type 1 diabetes mellitus (HCC) 08/12/2020   Acute kidney injury superimposed on chronic kidney disease (HCC) 07/25/2020   Generalized abdominal pain 07/23/2020   GERD (gastroesophageal reflux disease) 07/23/2020   Diabetic nephropathy associated with type 1 diabetes mellitus (HCC) 06/27/2020   Sepsis (HCC) 05/27/2020   HLD (hyperlipidemia) 05/23/2020   Sinus tachycardia 05/09/2020   Anemia 04/20/2020   Depression with anxiety 07/05/2019   Diabetic polyneuropathy associated with type 1 diabetes mellitus (HCC) 09/24/2017   Gastroparesis    Goiter 10/05/2009   DM2 (diabetes mellitus, type 2) (HCC) 10/05/2009   Hypertension associated with diabetes (HCC)  10/05/2009    ONSET DATE: 04/07/23 referral  REFERRING DIAG: [phone] (ICD-10-CM) - S/P AKA (above knee amputation) bilateral (HCC)   THERAPY DIAG:  Abnormal posture  Other abnormalities of gait and mobility  Muscle weakness (generalized)  Rationale for Evaluation and Treatment: Rehabilitation  SUBJECTIVE:                                                                                                                                                                                             SUBJECTIVE STATEMENT: [place]  [place]. BG 182 at beginning of session. Patient did not don prosthetics at home over the weekend. Denies falls.  Pt accompanied by: friend  PERTINENT HISTORY: DM, diabetic gastroparesis, GERD, HTN, hyperthyroidism  PAIN:  Are you having pain? No  VITALS: Vitals:   04/20/23 1317  BP: (!) 141/90  Pulse: 94      PRECAUTIONS: Fall; B diabetic retinopathy  PATIENT GOALS: "to be able to walk again"                                                                                                                              TREATMENT GAIT/ PRE-GAIT: -standing lateral & A/P weight shifts in // bars with CGA feeling balance point on prosthetic feet  -8', 12', 18' with seated rest breaks between  -RW + MinA + wc follow for safety  -Min tactile facilitation for lateral weight shifts to allow for foot advancement   Theract: -A/P transfer to mat from clinic [place]-> a/p transfer back to her lowered personal w[place]   PATIENT EDUCATION: Education details: continue practicing donning/doffing prosthetics  Person educated: Patient and friend Education method: Medical illustrator Education comprehension: verbalized understanding and needs further education  HOME EXERCISE PROGRAM: -practice donning/doffing prosthetic Access Code: [email or web address] URL: [email or web address] Date: 04/20/2023 Prepared by: Merry Lofty  Exercises - Prone Hip Extension with Residual Limb (BKA)  - 1 x daily - 7 x weekly - 3 sets - 10 reps - Sidelying Hip Abduction (AKA)  - 1 x daily - 7 x weekly -  3 sets - 10 reps - Supine Piriformis Stretch with Residual Limb (AKA)  - 1 x daily - 7 x weekly - 3 sets - 10 reps  GOALS: Goals reviewed with patient? Yes  SHORT TERM GOALS: Target date: 05/15/23  Pt will be independent with initial HEP for improved gait mechanics and balance  Baseline: to be updated Goal status: INITIAL  2.  Patient will ambulate at least 39ft with LRAD and no more than  MinA to demonstrate improved mobility  Baseline: 71ft // bars + MinA Goal status: INITIAL  3.  Patient will complete sit <> stand with LRAD and no more than CGA for improved independence with transfers Baseline: MinA Goal status: INITIAL  4.  Patient will complete floor transfer with assist as initial step of fall recovery  Baseline: unable to complete on eval Goal status: INITIAL   LONG TERM GOALS: Target date: 07/10/23  Pt will be independent with final HEP for improved gait mechanics and balance  Baseline: to be updated Goal status: INITIAL  Patient will ambulate at least 71ft with LRAD, ModI to demonstrate improved mobility  Baseline: 74ft // bars + MinA Goal status: INITIAL  Patient will complete sit <> stand with LRAD. ModI for improved independence with transfers Baseline: MinA Goal status: INITIAL  Patient will complete floor transfer without assist as initial step of fall recovery  Baseline: unable to complete on eval Goal status: INITIAL  5. Patient will don/doff prosthetics independently   Baseline: MaxA  Goal status: INITIAL  ASSESSMENT:  CLINICAL IMPRESSION: Patient seen for skilled PT session with emphasis on progressing gait training with B stubbies. Patient progressing very well with standing static and dynamic balance. Does require at least U UE support and CGA for safety, but is improving with feeling balance point on foot plate of prosthetics. Discussed impact of new gait pattern on endurance and activity tolerance. Prescribed initial HEP that can be done with and without prosthetics. Continue POC.   OBJECTIVE IMPAIRMENTS: Abnormal gait, decreased activity tolerance, decreased balance, decreased endurance, decreased knowledge of condition, decreased knowledge of use of DME, decreased strength, impaired vision/preception, and prosthetic dependency .   ACTIVITY LIMITATIONS: carrying, lifting, standing, squatting, transfers, hygiene/grooming, locomotion level,  and caring for others  PARTICIPATION LIMITATIONS: meal prep, cleaning, interpersonal relationship, driving, shopping, community activity, and occupation  PERSONAL FACTORS: Age, Behavior pattern, Past/current experiences, Social background, Time since onset of injury/illness/exacerbation, Transportation, and 3+ comorbidities: see above  are also affecting patient's functional outcome.   REHAB POTENTIAL: Good  CLINICAL DECISION MAKING: Stable/uncomplicated  EVALUATION COMPLEXITY: Low  PLAN:  PT FREQUENCY: 2x/week  PT DURATION: 12 weeks  PLANNED INTERVENTIONS: 97164- PT Re-evaluation, 97110-Therapeutic exercises, 97530- Therapeutic activity, 97112- Neuromuscular re-education, 97535- Self Care, [id number]- Manual therapy, [phone]- Gait training, [phone]- Prosthetic training, [phone]- Aquatic Therapy, Patient/Family education, Balance training, Stair training, Scar mobilization, Vestibular training, Visual/preceptual remediation/compensation, DME instructions, and Wheelchair mobility training  PLAN FOR NEXT SESSION: Gait, balance, floor transfers, donning/doffing prosthetics   Westley Foots, PT Westley Foots, PT, DPT, CBIS  04/20/2023, 2:03 PM

## 2023-04-23 ENCOUNTER — Ambulatory Visit: Payer: 59 | Admitting: Physical Therapy

## 2023-04-23 VITALS — BP 125/74 | HR 98

## 2023-04-23 DIAGNOSIS — R293 Abnormal posture: Secondary | ICD-10-CM

## 2023-04-23 DIAGNOSIS — R2689 Other abnormalities of gait and mobility: Secondary | ICD-10-CM

## 2023-04-23 DIAGNOSIS — M6281 Muscle weakness (generalized): Secondary | ICD-10-CM

## 2023-04-24 ENCOUNTER — Encounter: Payer: Self-pay | Admitting: Physical Therapy

## 2023-04-24 NOTE — Therapy (Signed)
 OUTPATIENT PHYSICAL THERAPY NEURO TREATMENT   Patient Name: Kelli Hudson MRN: [id number] DOB:04-26-1990, 33 y.o., female Today's Date: 04/24/2023   PCP: Cristino Martes, NP REFERRING PROVIDER: Aldean Baker, MD  END OF SESSION:  PT End of Session - 04/23/23 1315     Visit Number 4    Number of Visits 25    Date for PT Re-Evaluation 07/10/23    Authorization Type Medicare/Medicaid    Progress Note Due on Visit 10    PT Start Time 1314    PT Stop Time 1400    PT Time Calculation (min) 46 min    Equipment Utilized During Treatment Gait belt    Activity Tolerance Patient tolerated treatment well    Behavior During Therapy WFL for tasks assessed/performed             Past Medical History:  Diagnosis Date   Acute H. pylori gastric ulcer    Coffee ground emesis    Diabetes mellitus (HCC)    Diabetic gastroparesis (HCC)    DKA (diabetic ketoacidosis) (HCC) 02/24/2021   Gastroparesis    GERD (gastroesophageal reflux disease)    Hypertension    Hyperthyroidism    Intractable nausea and vomiting 04/20/2021   Normocytic anemia 04/20/2020   Prolonged Q-T interval on ECG    Past Surgical History:  Procedure Laterality Date   AMPUTATION Bilateral 10/24/2022   Procedure: BILATERAL BELOW KNEE AMPUTATION;  Surgeon: Nadara Mustard, MD;  Location: [facility] OR;  Service: Orthopedics;  Laterality: Bilateral;   AMPUTATION Bilateral 10/08/2022   Procedure: BILATERAL LEG DEBRIDEMENT;  Surgeon: Nadara Mustard, MD;  Location: [facility] OR;  Service: Orthopedics;  Laterality: Bilateral;   AMPUTATION Bilateral 11/14/2022   Procedure: BILATERAL ABOVE KNEE AMPUTATION;  Surgeon: Nadara Mustard, MD;  Location: [facility] OR;  Service: Orthopedics;  Laterality: Bilateral;   AMPUTATION TOE Left 03/10/2018   Procedure: AMPUTATION FIFTH TOE;  Surgeon: Vivi Barrack, DPM;  Location: [facility];  Service: Podiatry;  Laterality: Left;   APPLICATION OF WOUND VAC Bilateral 10/12/2022    Procedure: APPLICATION OF WOUND VAC BILATERAL LEGS;  Surgeon: Myrene Galas, MD;  Location: [facility] OR;  Service: Orthopedics;  Laterality: Bilateral;   APPLICATION OF WOUND VAC Bilateral 10/24/2022   Procedure: APPLICATION OF WOUND VAC;  Surgeon: Nadara Mustard, MD;  Location: [facility] OR;  Service: Orthopedics;  Laterality: Bilateral;   BIOPSY  01/28/2020   Procedure: BIOPSY;  Surgeon: Kathi Der, MD;  Location: [facility];  Service: Gastroenterology;;   BOTOX INJECTION  08/26/2021   Procedure: BOTOX INJECTION;  Surgeon: Sherrilyn Rist, MD;  Location: [facility];  Service: Gastroenterology;;   ESOPHAGOGASTRODUODENOSCOPY N/A 01/28/2020   Procedure: ESOPHAGOGASTRODUODENOSCOPY (EGD);  Surgeon: Kathi Der, MD;  Location: [facility];  Service: Gastroenterology;  Laterality: N/A;   ESOPHAGOGASTRODUODENOSCOPY (EGD) WITH PROPOFOL Left 09/08/2015   Procedure: ESOPHAGOGASTRODUODENOSCOPY (EGD) WITH PROPOFOL;  Surgeon: Willis Modena, MD;  Location: [facility];  Service: Endoscopy;  Laterality: Left;   ESOPHAGOGASTRODUODENOSCOPY (EGD) WITH PROPOFOL N/A 08/26/2021   Procedure: ESOPHAGOGASTRODUODENOSCOPY (EGD) WITH PROPOFOL;  Surgeon: Sherrilyn Rist, MD;  Location: [facility];  Service: Gastroenterology;  Laterality: N/A;   GASTRIC STIMULATOR IMPLANT SURGERY  06/2022   at [facility]   I & D EXTREMITY Bilateral 10/12/2022   Procedure: IRRIGATION AND  DEBRIDEMENT  BILATERAL LEGS;  Surgeon: Myrene Galas, MD;  Location: [facility] OR;  Service: Orthopedics;  Laterality: Bilateral;   I & D EXTREMITY Right 10/13/2022   Procedure: RIGHT THIGH WOUND  VAC EXCHANGE;  Surgeon: Roby Lofts, MD;  Location: [facility] OR;  Service: Orthopedics;  Laterality: Right;   I & D EXTREMITY Bilateral 10/17/2022   Procedure: BILATERAL THIGH AND LEG DEBRIDEMENT, PARTIAL BILATERAL FOOT AMPUTATION;  Surgeon: Nadara Mustard, MD;  Location: [facility] OR;  Service: Orthopedics;  Laterality: Bilateral;   I & D EXTREMITY Bilateral 11/05/2022    Procedure: BILATERAL THIGH DEBRIDEMENT;  Surgeon: Nadara Mustard, MD;  Location: [facility] OR;  Service: Orthopedics;  Laterality: Bilateral;   I & D EXTREMITY Bilateral 11/07/2022   Procedure: BILATERAL THIGH AND LEG DEBRIDEMENT;  Surgeon: Nadara Mustard, MD;  Location: [facility] OR;  Service: Orthopedics;  Laterality: Bilateral;   I & D EXTREMITY Bilateral 11/12/2022   Procedure: BILATERAL LEG DEBRIDEMENTS;  Surgeon: Nadara Mustard, MD;  Location: [facility] OR;  Service: Orthopedics;  Laterality: Bilateral;   IRRIGATION AND DEBRIDEMENT FOOT Bilateral 10/05/2022   Procedure: IRRIGATION AND DEBRIDEMENT FOOT;  Surgeon: [name], DPM;  Location: [facility];  Service: Orthopedics/Podiatry;  Laterality: Bilateral;   WISDOM TOOTH EXTRACTION     Patient Active Problem List   Diagnosis Date Noted   Influenza A 04/05/2023   Elevated lipase 04/05/2023   Esophagitis 03/24/2023   SIRS (systemic inflammatory response syndrome) (HCC) 03/02/2023   Fever 03/02/2023   Metabolic acidosis 03/02/2023   Hypertensive urgency 02/21/2023   Chronic pain 02/21/2023   Abscess of thigh 11/28/2022   C. 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   Hypokalemia 11/27/2022   Effusion, left knee 11/05/2022   MDD (major depressive disorder), recurrent episode, moderate (HCC) 10/16/2022   Necrotizing fasciitis of pelvic region and thigh (HCC) 10/08/2022   Necrotizing fasciitis of lower leg (HCC) 10/08/2022   Necrotizing fasciitis (HCC) 10/08/2022   Sepsis due to cellulitis (HCC) 10/03/2022   Cellulitis of lower extremity 10/03/2022   Multinodular goiter 06/18/2022   Malnutrition of moderate degree 04/18/2022   Intractable vomiting with nausea 04/17/2022   DKA (diabetic ketoacidosis) (HCC) 03/04/2022   Nausea vomiting and diarrhea 12/25/2021   Diabetic gastroparesis (HCC) 10/09/2021   Drug-seeking behavior 09/21/2021   Essential hypertension 08/25/2021   Diabetes mellitus type 1 with  complications (HCC) 08/25/2021   GERD without esophagitis 08/25/2021   Hematemesis    Prolonged Q-T interval on ECG    Nausea and vomiting 07/20/2021   Anemia of chronic disease 06/06/2021   Dehydration 06/04/2021   Hypomagnesemia 04/21/2021   Ulcerated, foot, left, with fat layer exposed (HCC) 04/20/2021   Uncontrolled type 1 diabetes mellitus with hyperglycemia, with long-term current use of insulin (HCC) 12/18/2020   DM type 1 (diabetes mellitus, type 1) (HCC) 12/18/2020   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) 11/09/2020   Diabetic retinopathy of both eyes associated with type 1 diabetes mellitus (HCC) 09/24/2020   Diabetic gastroparesis associated with type 1 diabetes mellitus (HCC) 08/12/2020   Acute kidney injury superimposed on chronic kidney disease (HCC) 07/25/2020   Generalized abdominal pain 07/23/2020   GERD (gastroesophageal reflux disease) 07/23/2020   Diabetic nephropathy associated with type 1 diabetes mellitus (HCC) 06/27/2020   Sepsis (HCC) 05/27/2020   HLD (hyperlipidemia) 05/23/2020   Sinus tachycardia 05/09/2020   Anemia 04/20/2020   Depression with anxiety 07/05/2019   Diabetic polyneuropathy associated with type 1 diabetes mellitus (HCC) 09/24/2017   Gastroparesis    Goiter 10/05/2009   DM2 (diabetes mellitus, type 2) (HCC) 10/05/2009   Hypertension associated with diabetes (HCC)  10/05/2009    ONSET DATE: 04/07/23 referral  REFERRING DIAG: [phone] (ICD-10-CM) - S/P AKA (above knee amputation) bilateral (HCC)   THERAPY DIAG:  Abnormal posture  Other abnormalities of gait and mobility  Muscle weakness (generalized)  Rationale for Evaluation and Treatment: Rehabilitation  SUBJECTIVE:                                                                                                                                                                                             SUBJECTIVE STATEMENT: [place]. BG 182 at beginning of session. Patient did not don prosthetics at home over the weekend. Denies falls.  Pt accompanied by: friend  PERTINENT HISTORY: DM, diabetic gastroparesis, GERD, HTN, hyperthyroidism  PAIN:  Are you having pain? No  VITALS: Vitals:   04/23/23 1326  BP: 125/74  Pulse: 98     PRECAUTIONS: Fall; B diabetic retinopathy  PATIENT GOALS: "to be able to walk again"                                                                                                                              TREATMENT  Self Care:  Patient arrives to session with bag full of socks; socks are 1-ply and 3-ply without cut holes on the bottom for lanyard; PT assists in helping patient cut a few pairs of socks, educate on donning, and smoothing out wrinkles, brainstorming how to separate out for easy access to tell ply as visual deficits make challenging to see colors on socks (discussed adding small raised surface to edge on not load bearing or separating into different bags etc), patient able to don sock with only min verbal cues on how to fully ensure no wrinkles, only trialed 1-ply in today's session  TherAct:  Patient arrives in transport chair without prosthetics donned; she is able to don prosthetics with only setup assistance in today's session;  once patient standing, seat cushion removed, patient performs A/P transfer onto low mat and back at end of session to put cushion back on  Between // bars with plus 2 to assist with pulling patient back to chair for seated breaks: Stand with bilateral use of // bars and practice stepping forward and back 2 x 8 feet with CGA (minA only intermittently for mild correction on balance), increased challenge with retro gait Between 2 trials 1-ply sock donned as fluid pumped out of leg Standing static balance decreasing UE support from single to no hands with mix of SBA-minA for balance correction repeated trials over 5 minutes with  patient tolerating ~10 seconds without assitance Standing static balance with cross body reaching with decreasing UE support ~2 min trial with frequent intermittent use of UE to stabilize, increased external rotation of L leg indicated need for additional socks so returned patient to chair but held further trial in today's session due to time constraints   PATIENT EDUCATION: Education details: continue practicing donning/doffing prosthetics + continue to bring socks Person educated: Patient and friend Education method: Explanation and Demonstration Education comprehension: verbalized understanding and needs further education  HOME EXERCISE PROGRAM: -practice donning/doffing prosthetic Access Code: [email or web address] URL: [email or web address] Date: 04/20/2023 Prepared by: Merry Lofty  Exercises - Prone Hip Extension with Residual Limb (BKA)  - 1 x daily - 7 x weekly - 3 sets - 10 reps - Sidelying Hip Abduction (AKA)  - 1 x daily - 7 x weekly - 3 sets - 10 reps - Supine Piriformis Stretch with Residual Limb (AKA)  - 1 x daily - 7 x weekly - 3 sets - 10 reps  GOALS: Goals reviewed with patient? Yes  SHORT TERM GOALS: Target date: 05/15/23  Pt will be independent with initial HEP for improved gait mechanics and balance  Baseline: to be updated Goal status: INITIAL  2.  Patient will ambulate at least 82ft with LRAD and no more than MinA to demonstrate improved mobility  Baseline: 21ft // bars + MinA Goal status: INITIAL  3.  Patient will complete sit <> stand with LRAD and no more than CGA for improved independence with transfers Baseline: MinA Goal status: INITIAL  4.  Patient will complete floor transfer with assist as initial step of fall recovery  Baseline: unable to complete on eval Goal status: INITIAL   LONG TERM GOALS: Target date: 07/10/23  Pt will be independent with final HEP for improved gait mechanics and balance  Baseline: to be updated Goal status:  INITIAL  Patient will ambulate at least 39ft with LRAD, ModI to demonstrate improved mobility  Baseline: 9ft // bars + MinA Goal status: INITIAL  Patient will complete sit <> stand with LRAD. ModI for improved independence with transfers Baseline: MinA Goal status: INITIAL  Patient will complete floor transfer without assist as initial step of fall recovery  Baseline: unable to complete on eval Goal status: INITIAL  5. Patient will don/doff prosthetics independently   Baseline: MaxA  Goal status: INITIAL  ASSESSMENT:  CLINICAL IMPRESSION: Patient seen for skilled PT session with emphasis on sock management, gait, and balance training. Initiated backwards steps in // bars with minA and bilateral UE support. Patient with increased challenge with retro walking due to hip extensor weakness. Patient with increased indepedence donning/doffing limb. Static balance is relatively limited in today's session when trunk shifts outside of base of support but patient able to intermittently use hip strategy to self-correct balance. Continue POC.   OBJECTIVE IMPAIRMENTS: Abnormal gait, decreased activity tolerance, decreased balance, decreased endurance, decreased knowledge of condition, decreased knowledge of use of DME, decreased strength, impaired vision/preception, and prosthetic  dependency .   ACTIVITY LIMITATIONS: carrying, lifting, standing, squatting, transfers, hygiene/grooming, locomotion level, and caring for others  PARTICIPATION LIMITATIONS: meal prep, cleaning, interpersonal relationship, driving, shopping, community activity, and occupation  PERSONAL FACTORS: Age, Behavior pattern, Past/current experiences, Social background, Time since onset of injury/illness/exacerbation, Transportation, and 3+ comorbidities: see above  are also affecting patient's functional outcome.   REHAB POTENTIAL: Good  CLINICAL DECISION MAKING: Stable/uncomplicated  EVALUATION COMPLEXITY:  Low  PLAN:  PT FREQUENCY: 2x/week  PT DURATION: 12 weeks  PLANNED INTERVENTIONS: 97164- PT Re-evaluation, 97110-Therapeutic exercises, 97530- Therapeutic activity, 97112- Neuromuscular re-education, 97535- Self Care, [id number]- Manual therapy, [phone]- Gait training, [phone]- Prosthetic training, [phone]- Aquatic Therapy, Patient/Family education, Balance training, Stair training, Scar mobilization, Vestibular training, Visual/preceptual remediation/compensation, DME instructions, and Wheelchair mobility training  PLAN FOR NEXT SESSION: Gait, balance, floor transfers, donning/doffing prosthetics, patient may require 5-ply socks as does not have some and cut more socks for patient    Carmelia Bake, PT, DPT  04/24/2023, 7:56 AM

## 2023-04-27 ENCOUNTER — Other Ambulatory Visit: Payer: Self-pay

## 2023-04-27 ENCOUNTER — Ambulatory Visit: Payer: 59

## 2023-04-27 ENCOUNTER — Ambulatory Visit: Admitting: Occupational Therapy

## 2023-04-27 DIAGNOSIS — M6281 Muscle weakness (generalized): Secondary | ICD-10-CM

## 2023-04-27 DIAGNOSIS — R293 Abnormal posture: Secondary | ICD-10-CM

## 2023-04-27 DIAGNOSIS — R29898 Other symptoms and signs involving the musculoskeletal system: Secondary | ICD-10-CM

## 2023-04-27 DIAGNOSIS — R2689 Other abnormalities of gait and mobility: Secondary | ICD-10-CM

## 2023-04-27 NOTE — Therapy (Signed)
 OUTPATIENT PHYSICAL THERAPY NEURO TREATMENT   Patient Name: Kelli Hudson MRN: [id number] DOB:06/09/1990, 33 y.o., female Today's Date: 04/27/2023   PCP: Cristino Martes, NP REFERRING PROVIDER: Aldean Baker, MD  END OF SESSION:  PT End of Session - 04/27/23 1403     Visit Number 5    Number of Visits 25    Date for PT Re-Evaluation 07/10/23    Authorization Type Medicare/Medicaid    Progress Note Due on Visit 10    PT Start Time 1401    PT Stop Time 1443    PT Time Calculation (min) 42 min    Equipment Utilized During Treatment Gait belt             Past Medical History:  Diagnosis Date   Acute H. pylori gastric ulcer    Coffee ground emesis    Diabetes mellitus (HCC)    Diabetic gastroparesis (HCC)    DKA (diabetic ketoacidosis) (HCC) 02/24/2021   Gastroparesis    GERD (gastroesophageal reflux disease)    Hypertension    Hyperthyroidism    Intractable nausea and vomiting 04/20/2021   Normocytic anemia 04/20/2020   Prolonged Q-T interval on ECG    Past Surgical History:  Procedure Laterality Date   AMPUTATION Bilateral 10/24/2022   Procedure: BILATERAL BELOW KNEE AMPUTATION;  Surgeon: Nadara Mustard, MD;  Location: [facility] OR;  Service: Orthopedics;  Laterality: Bilateral;   AMPUTATION Bilateral 10/08/2022   Procedure: BILATERAL LEG DEBRIDEMENT;  Surgeon: Nadara Mustard, MD;  Location: [facility] OR;  Service: Orthopedics;  Laterality: Bilateral;   AMPUTATION Bilateral 11/14/2022   Procedure: BILATERAL ABOVE KNEE AMPUTATION;  Surgeon: Nadara Mustard, MD;  Location: [facility] OR;  Service: Orthopedics;  Laterality: Bilateral;   AMPUTATION TOE Left 03/10/2018   Procedure: AMPUTATION FIFTH TOE;  Surgeon: Vivi Barrack, DPM;  Location: [facility];  Service: Podiatry;  Laterality: Left;   APPLICATION OF WOUND VAC Bilateral 10/12/2022   Procedure: APPLICATION OF WOUND VAC BILATERAL LEGS;  Surgeon: Myrene Galas, MD;  Location: [facility] OR;  Service: Orthopedics;   Laterality: Bilateral;   APPLICATION OF WOUND VAC Bilateral 10/24/2022   Procedure: APPLICATION OF WOUND VAC;  Surgeon: Nadara Mustard, MD;  Location: [facility] OR;  Service: Orthopedics;  Laterality: Bilateral;   BIOPSY  01/28/2020   Procedure: BIOPSY;  Surgeon: Kathi Der, MD;  Location: [facility];  Service: Gastroenterology;;   BOTOX INJECTION  08/26/2021   Procedure: BOTOX INJECTION;  Surgeon: Sherrilyn Rist, MD;  Location: [facility];  Service: Gastroenterology;;   ESOPHAGOGASTRODUODENOSCOPY N/A 01/28/2020   Procedure: ESOPHAGOGASTRODUODENOSCOPY (EGD);  Surgeon: Kathi Der, MD;  Location: [facility];  Service: Gastroenterology;  Laterality: N/A;   ESOPHAGOGASTRODUODENOSCOPY (EGD) WITH PROPOFOL Left 09/08/2015   Procedure: ESOPHAGOGASTRODUODENOSCOPY (EGD) WITH PROPOFOL;  Surgeon: Willis Modena, MD;  Location: [facility] END[facility]COPY;  Service: Endoscopy;  Laterality: Left;   ESOPHAGOGASTRODUODENOSCOPY (EGD) WITH PROPOFOL N/A 08/26/2021   Procedure: ESOPHAGOGASTRODUODENOSCOPY (EGD) WITH PROPOFOL;  Surgeon: Sherrilyn Rist, MD;  Location: [facility];  Service: Gastroenterology;  Laterality: N/A;   GASTRIC STIMULATOR IMPLANT SURGERY  06/2022   at [facility]   I & D EXTREMITY Bilateral 10/12/2022   Procedure: IRRIGATION AND  DEBRIDEMENT  BILATERAL LEGS;  Surgeon: Myrene Galas, MD;  Location: [facility] OR;  Service: Orthopedics;  Laterality: Bilateral;   I & D EXTREMITY Right 10/13/2022   Procedure: RIGHT THIGH WOUND VAC EXCHANGE;  Surgeon: Roby Lofts, MD;  Location: [facility] OR;  Service: Orthopedics;  Laterality: Right;  I & D EXTREMITY Bilateral 10/17/2022   Procedure: BILATERAL THIGH AND LEG DEBRIDEMENT, PARTIAL BILATERAL FOOT AMPUTATION;  Surgeon: Nadara Mustard, MD;  Location: [facility] OR;  Service: Orthopedics;  Laterality: Bilateral;   I & D EXTREMITY Bilateral 11/05/2022   Procedure: BILATERAL THIGH DEBRIDEMENT;  Surgeon: Nadara Mustard, MD;  Location: [facility] OR;  Service: Orthopedics;  Laterality:  Bilateral;   I & D EXTREMITY Bilateral 11/07/2022   Procedure: BILATERAL THIGH AND LEG DEBRIDEMENT;  Surgeon: Nadara Mustard, MD;  Location: [facility] OR;  Service: Orthopedics;  Laterality: Bilateral;   I & D EXTREMITY Bilateral 11/12/2022   Procedure: BILATERAL LEG DEBRIDEMENTS;  Surgeon: Nadara Mustard, MD;  Location: [facility] OR;  Service: Orthopedics;  Laterality: Bilateral;   IRRIGATION AND DEBRIDEMENT FOOT Bilateral 10/05/2022   Procedure: IRRIGATION AND DEBRIDEMENT FOOT;  Surgeon: [name], DPM;  Location: [facility];  Service: Orthopedics/Podiatry;  Laterality: Bilateral;   WISDOM TOOTH EXTRACTION     Patient Active Problem List   Diagnosis Date Noted   Influenza A 04/05/2023   Elevated lipase 04/05/2023   Esophagitis 03/24/2023   SIRS (systemic inflammatory response syndrome) (HCC) 03/02/2023   Fever 03/02/2023   Metabolic acidosis 03/02/2023   Hypertensive urgency 02/21/2023   Chronic pain 02/21/2023   Abscess of thigh 11/28/2022   C. 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   Hypokalemia 11/27/2022   Effusion, left knee 11/05/2022   MDD (major depressive disorder), recurrent episode, moderate (HCC) 10/16/2022   Necrotizing fasciitis of pelvic region and thigh (HCC) 10/08/2022   Necrotizing fasciitis of lower leg (HCC) 10/08/2022   Necrotizing fasciitis (HCC) 10/08/2022   Sepsis due to cellulitis (HCC) 10/03/2022   Cellulitis of lower extremity 10/03/2022   Multinodular goiter 06/18/2022   Malnutrition of moderate degree 04/18/2022   Intractable vomiting with nausea 04/17/2022   DKA (diabetic ketoacidosis) (HCC) 03/04/2022   Nausea vomiting and diarrhea 12/25/2021   Diabetic gastroparesis (HCC) 10/09/2021   Drug-seeking behavior 09/21/2021   Essential hypertension 08/25/2021   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) 08/25/2021   GERD without esophagitis 08/25/2021   Hematemesis    Prolonged Q-T interval on ECG     Nausea and vomiting 07/20/2021   Anemia of chronic disease 06/06/2021   Dehydration 06/04/2021   Hypomagnesemia 04/21/2021   Ulcerated, foot, left, with fat layer exposed (HCC) 04/20/2021   Uncontrolled type 1 diabetes mellitus with hyperglycemia, with long-term current use of insulin (HCC) 12/18/2020   DM type 1 (diabetes mellitus, type 1) (HCC) 12/18/2020   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) 11/09/2020   Diabetic retinopathy of both eyes associated with type 1 diabetes mellitus (HCC) 09/24/2020   Diabetic gastroparesis associated with type 1 diabetes mellitus (HCC) 08/12/2020   Acute kidney injury superimposed on chronic kidney disease (HCC) 07/25/2020   Generalized abdominal pain 07/23/2020   GERD (gastroesophageal reflux disease) 07/23/2020   Diabetic nephropathy associated with type 1 diabetes mellitus (HCC) 06/27/2020   Sepsis (HCC) 05/27/2020   HLD (hyperlipidemia) 05/23/2020   Sinus tachycardia 05/09/2020   Anemia 04/20/2020   Depression with anxiety 07/05/2019   Diabetic polyneuropathy associated with type 1 diabetes mellitus (HCC) 09/24/2017   Gastroparesis    Goiter 10/05/2009   DM2 (diabetes mellitus, type 2) (HCC) 10/05/2009   Hypertension associated with diabetes (HCC) 10/05/2009    ONSET DATE: 04/07/23 referral  REFERRING DIAG: Z61.096,E45.409 (ICD-10-CM) - S/P AKA (above knee amputation) bilateral (  HCC)   THERAPY DIAG:  Abnormal posture  Other abnormalities of gait and mobility  Muscle weakness (generalized)  Rationale for Evaluation and Treatment: Rehabilitation  SUBJECTIVE:                                                                                                                                                                                             SUBJECTIVE STATEMENT: Patient arrives to clinic, no changes. Does have swollen B eyes, R >L. Reports mild pain. Patient to f/u with provider should it worsen/not improve.  Denies falls.  Pt accompanied by: friend  PERTINENT HISTORY: DM, diabetic gastroparesis, GERD, HTN, hyperthyroidism  PAIN:  Are you having pain? No  VITALS: There were no vitals filed for this visit.    PRECAUTIONS: Fall; B diabetic retinopathy  PATIENT GOALS: "to be able to walk again"                                                                                                                              TREATMENT Theract: -don/doff prosthetics with supervision (3ply sock)  -static standing balance with intermittent U UE support on RW + CGA -progressed to minimal dynamic standing balance moving manipulatives from one side of rolling table to the other- CGA/MinA provided with intermittent LOB posteriorly  -progressed to reaching across body to move manipulatives with contralateral UE   Self care/home management: -patient to discuss with [employer]: getting 5 ply socks, positioning of L foot, time frame for pylon lengthening  -patient to practice standing at home with friend and using 2nd wc braced against wall/something sturdy to provide UE support   PATIENT EDUCATION: Education details: see self care Person educated: Patient and friend Education method: Explanation and Demonstration Education comprehension: verbalized understanding and needs further education  HOME EXERCISE PROGRAM: -practice donning/doffing prosthetic Access Code: [email or web address] URL: [email or web address] Date: 04/20/2023 Prepared by: Merry Lofty  Exercises - Prone Hip Extension with Residual Limb (BKA)  - 1 x daily - 7 x weekly - 3 sets - 10 reps - Sidelying Hip Abduction (AKA)  - 1 x daily -  7 x weekly - 3 sets - 10 reps - Supine Piriformis Stretch with Residual Limb (AKA)  - 1 x daily - 7 x weekly - 3 sets - 10 reps  GOALS: Goals reviewed with patient? Yes  SHORT TERM GOALS: Target date: 05/15/23  Pt will be independent with initial HEP for improved gait mechanics and balance   Baseline: to be updated Goal status: INITIAL  2.  Patient will ambulate at least 49ft with LRAD and no more than MinA to demonstrate improved mobility  Baseline: 56ft // bars + MinA Goal status: INITIAL  3.  Patient will complete sit <> stand with LRAD and no more than CGA for improved independence with transfers Baseline: MinA Goal status: INITIAL  4.  Patient will complete floor transfer with assist as initial step of fall recovery  Baseline: unable to complete on eval Goal status: INITIAL   LONG TERM GOALS: Target date: 07/10/23  Pt will be independent with final HEP for improved gait mechanics and balance  Baseline: to be updated Goal status: INITIAL  Patient will ambulate at least 37ft with LRAD, ModI to demonstrate improved mobility  Baseline: 56ft // bars + MinA Goal status: INITIAL  Patient will complete sit <> stand with LRAD. ModI for improved independence with transfers Baseline: MinA Goal status: INITIAL  Patient will complete floor transfer without assist as initial step of fall recovery  Baseline: unable to complete on eval Goal status: INITIAL  5. Patient will don/doff prosthetics independently   Baseline: MaxA  Goal status: INITIAL  ASSESSMENT:  CLINICAL IMPRESSION: Patient seen for skilled PT session with emphasis on progressing standing balance with B prosthetics. Progressing well with finding and maintaining balance point. Does continue to have tendency to shift weight posteriorly and demonstrates poor ability to recover from LOB independently. L foot ER in standing, but IR in sitting. Liner donned correctly (straight) and patient denies much movement at the hip. Patient to mention this at prosthetist appt on Thursday. Continue POC.   OBJECTIVE IMPAIRMENTS: Abnormal gait, decreased activity tolerance, decreased balance, decreased endurance, decreased knowledge of condition, decreased knowledge of use of DME, decreased strength, impaired  vision/preception, and prosthetic dependency .   ACTIVITY LIMITATIONS: carrying, lifting, standing, squatting, transfers, hygiene/grooming, locomotion level, and caring for others  PARTICIPATION LIMITATIONS: meal prep, cleaning, interpersonal relationship, driving, shopping, community activity, and occupation  PERSONAL FACTORS: Age, Behavior pattern, Past/current experiences, Social background, Time since onset of injury/illness/exacerbation, Transportation, and 3+ comorbidities: see above  are also affecting patient's functional outcome.   REHAB POTENTIAL: Good  CLINICAL DECISION MAKING: Stable/uncomplicated  EVALUATION COMPLEXITY: Low  PLAN:  PT FREQUENCY: 2x/week  PT DURATION: 12 weeks  PLANNED INTERVENTIONS: 97164- PT Re-evaluation, 97110-Therapeutic exercises, 97530- Therapeutic activity, 97112- Neuromuscular re-education, 97535- Self Care, [id number]- Manual therapy, [phone]- Gait training, [phone]- Prosthetic training, [phone]- Aquatic Therapy, Patient/Family education, Balance training, Stair training, Scar mobilization, Vestibular training, Visual/preceptual remediation/compensation, DME instructions, and Wheelchair mobility training  PLAN FOR NEXT SESSION: Gait, balance, floor transfers, donning/doffing prosthetics, prescribe modified sink HEP?, balance recovery in standing    Westley Foots, PT Westley Foots, PT, DPT, CBIS   04/27/2023, 3:41 PM

## 2023-04-27 NOTE — Therapy (Signed)
 OUTPATIENT OCCUPATIONAL THERAPY NEURO EVALUATION  Patient Name: Kelli Hudson MRN: [id number] DOB:[birth year], 33 y.o., female Today's Date: 04/27/2023  PCP: Cristino Martes, NP  REFERRING PROVIDER: Nadara Mustard, MD  END OF SESSION:  OT End of Session - 04/27/23 1636     Visit Number 1    Number of Visits 17   including eval   Date for OT Re-Evaluation 06/26/23    Authorization Type [employer] Medicare Dual/Medicaid    OT Start Time 1446    OT Stop Time 1528    OT Time Calculation (min) 42 min    Activity Tolerance Patient tolerated treatment well    Behavior During Therapy WFL for tasks assessed/performed             Past Medical History:  Diagnosis Date   Acute H. pylori gastric ulcer    Coffee ground emesis    Diabetes mellitus (HCC)    Diabetic gastroparesis (HCC)    DKA (diabetic ketoacidosis) (HCC) 02/24/2021   Gastroparesis    GERD (gastroesophageal reflux disease)    Hypertension    Hyperthyroidism    Intractable nausea and vomiting 04/20/2021   Normocytic anemia 04/20/2020   Prolonged Q-T interval on ECG    Past Surgical History:  Procedure Laterality Date   AMPUTATION Bilateral 10/24/2022   Procedure: BILATERAL BELOW KNEE AMPUTATION;  Surgeon: Nadara Mustard, MD;  Location: [facility] OR;  Service: Orthopedics;  Laterality: Bilateral;   AMPUTATION Bilateral 10/08/2022   Procedure: BILATERAL LEG DEBRIDEMENT;  Surgeon: Nadara Mustard, MD;  Location: [facility] OR;  Service: Orthopedics;  Laterality: Bilateral;   AMPUTATION Bilateral 11/14/2022   Procedure: BILATERAL ABOVE KNEE AMPUTATION;  Surgeon: Nadara Mustard, MD;  Location: [facility] OR;  Service: Orthopedics;  Laterality: Bilateral;   AMPUTATION TOE Left 03/10/2018   Procedure: AMPUTATION FIFTH TOE;  Surgeon: Vivi Barrack, DPM;  Location: [facility];  Service: Podiatry;  Laterality: Left;   APPLICATION OF WOUND VAC Bilateral 10/12/2022   Procedure: APPLICATION OF WOUND VAC BILATERAL LEGS;   Surgeon: Myrene Galas, MD;  Location: [facility] OR;  Service: Orthopedics;  Laterality: Bilateral;   APPLICATION OF WOUND VAC Bilateral 10/24/2022   Procedure: APPLICATION OF WOUND VAC;  Surgeon: Nadara Mustard, MD;  Location: [facility] OR;  Service: Orthopedics;  Laterality: Bilateral;   BIOPSY  01/28/2020   Procedure: BIOPSY;  Surgeon: Kathi Der, MD;  Location: [facility];  Service: Gastroenterology;;   BOTOX INJECTION  08/26/2021   Procedure: BOTOX INJECTION;  Surgeon: Sherrilyn Rist, MD;  Location: [facility];  Service: Gastroenterology;;   ESOPHAGOGASTRODUODENOSCOPY N/A 01/28/2020   Procedure: ESOPHAGOGASTRODUODENOSCOPY (EGD);  Surgeon: Kathi Der, MD;  Location: [facility];  Service: Gastroenterology;  Laterality: N/A;   ESOPHAGOGASTRODUODENOSCOPY (EGD) WITH PROPOFOL Left 09/08/2015   Procedure: ESOPHAGOGASTRODUODENOSCOPY (EGD) WITH PROPOFOL;  Surgeon: Willis Modena, MD;  Location: [facility];  Service: Endoscopy;  Laterality: Left;   ESOPHAGOGASTRODUODENOSCOPY (EGD) WITH PROPOFOL N/A 08/26/2021   Procedure: ESOPHAGOGASTRODUODENOSCOPY (EGD) WITH PROPOFOL;  Surgeon: Sherrilyn Rist, MD;  Location: [facility];  Service: Gastroenterology;  Laterality: N/A;   GASTRIC STIMULATOR IMPLANT SURGERY  06/2022   at [facility]   I & D EXTREMITY Bilateral 10/12/2022   Procedure: IRRIGATION AND  DEBRIDEMENT  BILATERAL LEGS;  Surgeon: Myrene Galas, MD;  Location: [facility] OR;  Service: Orthopedics;  Laterality: Bilateral;   I & D EXTREMITY Right 10/13/2022   Procedure: RIGHT THIGH WOUND VAC EXCHANGE;  Surgeon: Roby Lofts, MD;  Location: [facility] OR;  Service: Orthopedics;  Laterality: Right;   I & D EXTREMITY Bilateral 10/17/2022   Procedure: BILATERAL THIGH AND LEG DEBRIDEMENT, PARTIAL BILATERAL FOOT AMPUTATION;  Surgeon: Nadara Mustard, MD;  Location: [facility] OR;  Service: Orthopedics;  Laterality: Bilateral;   I & D EXTREMITY Bilateral 11/05/2022   Procedure: BILATERAL THIGH DEBRIDEMENT;  Surgeon: Nadara Mustard, MD;  Location[facility] OR;  Service: Orthopedics;  Laterality: Bilateral;   I & D EXTREMITY Bilateral 11/07/2022   Procedure: BILATERAL THIGH AND LEG DEBRIDEMENT;  Surgeon: Nadara Mustard, MD;  Location: [facility] OR;  Service: Orthopedics;  Laterality: Bilateral;   I & D EXTREMITY Bilateral 11/12/2022   Procedure: BILATERAL LEG DEBRIDEMENTS;  Surgeon: Nadara Mustard, MD;  Location: [facility] OR;  Service: Orthopedics;  Laterality: Bilateral;   IRRIGATION AND DEBRIDEMENT FOOT Bilateral 10/05/2022   Procedure: IRRIGATION AND DEBRIDEMENT FOOT;  Surgeon: [name], DPM;  Location: [facility];  Service: Orthopedics/Podiatry;  Laterality: Bilateral;   WISDOM TOOTH EXTRACTION     Patient Active Problem List   Diagnosis Date Noted   Influenza A 04/05/2023   Elevated lipase 04/05/2023   Esophagitis 03/24/2023   SIRS (systemic inflammatory response syndrome) (HCC) 03/02/2023   Fever 03/02/2023   Metabolic acidosis 03/02/2023   Hypertensive urgency 02/21/2023   Chronic pain 02/21/2023   Abscess of thigh 11/28/2022   C. 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   Hypokalemia 11/27/2022   Effusion, left knee 11/05/2022   MDD (major depressive disorder), recurrent episode, moderate (HCC) 10/16/2022   Necrotizing fasciitis of pelvic region and thigh (HCC) 10/08/2022   Necrotizing fasciitis of lower leg (HCC) 10/08/2022   Necrotizing fasciitis (HCC) 10/08/2022   Sepsis due to cellulitis (HCC) 10/03/2022   Cellulitis of lower extremity 10/03/2022   Multinodular goiter 06/18/2022   Malnutrition of moderate degree 04/18/2022   Intractable vomiting with nausea 04/17/2022   DKA (diabetic ketoacidosis) (HCC) 03/04/2022   Nausea vomiting and diarrhea 12/25/2021   Diabetic gastroparesis (HCC) 10/09/2021   Drug-seeking behavior 09/21/2021   Essential hypertension 08/25/2021   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) 08/25/2021   GERD without esophagitis  08/25/2021   Hematemesis    Prolonged Q-T interval on ECG    Nausea and vomiting 07/20/2021   Anemia of chronic disease 06/06/2021   Dehydration 06/04/2021   Hypomagnesemia 04/21/2021   Ulcerated, foot, left, with fat layer exposed (HCC) 04/20/2021   Uncontrolled type 1 diabetes mellitus with hyperglycemia, with long-term current use of insulin (HCC) 12/18/2020   DM type 1 (diabetes mellitus, type 1) (HCC) 12/18/2020   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) 11/09/2020   Diabetic retinopathy of both eyes associated with type 1 diabetes mellitus (HCC) 09/24/2020   Diabetic gastroparesis associated with type 1 diabetes mellitus (HCC) 08/12/2020   Acute kidney injury superimposed on chronic kidney disease (HCC) 07/25/2020   Generalized abdominal pain 07/23/2020   GERD (gastroesophageal reflux disease) 07/23/2020   Diabetic nephropathy associated with type 1 diabetes mellitus (HCC) 06/27/2020   Sepsis (HCC) 05/27/2020   HLD (hyperlipidemia) 05/23/2020   Sinus tachycardia 05/09/2020   Anemia 04/20/2020   Depression with anxiety 07/05/2019   Diabetic polyneuropathy associated with type 1 diabetes mellitus (HCC) 09/24/2017   Gastroparesis    Goiter 10/05/2009   DM2 (diabetes mellitus, type 2) (HCC) 10/05/2009   Hypertension associated with diabetes (HCC) 10/05/2009    ONSET DATE: 04/16/23 (referral date)  REFERRING DIAG: [id number] (  ICD-10-CM) - S/P AKA (above knee amputation) bilateral   THERAPY DIAG:  Muscle weakness (generalized)  Other symptoms and signs involving the musculoskeletal system  Abnormal posture  Rationale for Evaluation and Treatment: Rehabilitation  SUBJECTIVE:   SUBJECTIVE STATEMENT: Pt reported "good." Pt reported trying to not be too hard on self when practicing. Pt reported holding self to high standard. Pt reported prosthetic training going well.  Per pt, BLE amputations occurred mid-September. Pt reported receiving  prosthetics for BLE April 02, 2023.  Pt accompanied by: self,  friend Kenard Gower)  PERTINENT HISTORY: DM, diabetic gastroparesis, GERD, HTN, hyperthyroidism   PRECAUTIONS: Fall; B diabetic retinopathy   WEIGHT BEARING RESTRICTIONS: No  PAIN:  Are you having pain? Pt reported mild pain of R eye (2/10 pain level) with R eyelid swelling, which began 2-3 days ago (approx. Friday 04/24/23). OT noted moderate swelling of R eye and recommended to pt to f/u with MD. Pt verbalized understanding. Pt reported limited vision of R eye d/t diabetic retinopathy and no change in vision since swelling began.   FALLS: Has patient fallen in last 6 months? Yes. Number of falls 1 - around Thanksgiving when attempting to transfer to a second w/c  LIVING ENVIRONMENT:  Lives with: lives alone Lives in: House/apartment Stairs: No Has following equipment at home: Wheelchair (manual) and shower chair; aide 7 days/week for 4 hours  PLOF: Independent with basic ADLs, community mobility: assistance from friends  PATIENT GOALS: "I want to work on standing and balance"  OBJECTIVE:  Note: Objective measures were completed at Evaluation unless otherwise noted.  HAND DOMINANCE: Right  ADLs: Overall ADLs: ind, cautious Eating: ind Grooming: ind UB Dressing: ind, some difficulty with donning standard bras though no difficulty with donning sports bras LB Dressing: ind Toileting: ind Bathing: prefers at least direct or distant supervision though has completed ind - "I'm scared because shower chair moves a little bit because it's a little bigger than the tub so I feel better when someone is there." Tub Shower transfers: tub transfer bench Equipment: tub transfer bench, tub/shower  IADLs: Meal Prep: difficulty cutting food for meal prep/cooking when attempting to stand. Pt reported typically using microwave meals and sometimes using stove/oven. Pt reported trying to not be afraid of doing "normal stuff"  again.  Handwriting:  Pt reported no need for handwriting, decreased vision.  Pt reported using voice text to communicate using phone.  MOBILITY STATUS:  w/c, reported standing in prosthetics only when coming to therapy though will attempt to stand at home with another person to supervise  soon  POSTURE COMMENTS:  rounded shoulders and forward head  ACTIVITY TOLERANCE: Activity tolerance: about the same as PLOF, more tired after standing tasks though only for "just 10 minutes"  FUNCTIONAL OUTCOME MEASURES: PSFS: 6.7   Total score = sum of the activity scores/number of activities Minimum detectable change (90%CI) for average score = 2 points Minimum detectable change (90%CI) for single activity score = 3 points    UPPER EXTREMITY ROM:    Active ROM Right eval Left eval  Shoulder flexion [facility]  Shoulder abduction    Shoulder adduction    Shoulder extension    Shoulder internal rotation    Shoulder external rotation    Elbow flexion    Elbow extension    Wrist flexion    Wrist extension    Wrist ulnar deviation    Wrist radial deviation    Wrist pronation    Wrist supination    (  Blank rows = not tested)  UPPER EXTREMITY MMT:     MMT Right eval Left eval  Shoulder flexion    Shoulder abduction    Shoulder adduction    Shoulder extension    Shoulder internal rotation    Shoulder external rotation    Middle trapezius    Lower trapezius    Elbow flexion    Elbow extension    Wrist flexion    Wrist extension    Wrist ulnar deviation    Wrist radial deviation    Wrist pronation    Wrist supination    (Blank rows = not tested)  HAND FUNCTION: Grip strength: Right: 57 lbs; Left: 39 lbs  Pt reported some difficulty with tasks requiring strength of L side. Pt reported noticing weakness of L side compared to R side.  COORDINATION: 9 Hole Peg test: Right: 37 sec; Left: 33 sec, decreased speed secondary to visual deficits  SENSATION: Pt denied  numbness/tingling/discomfort of BLE and BUE.  EDEMA: R eyelid swollen.  MUSCLE TONE: RUE: Within functional limits and LUE: Within functional limits  COGNITION: Overall cognitive status: Within functional limits for tasks assessed  VISION: Subjective report: Corrective lens no longer help, low vision per pt, better seeing close up. Pt reported some peripheral vision L side, less peripheral vision R side. Baseline vision: B diabetic retinopathy: "it's low."  Visual history:  B diabetic retinopathy  VISION ASSESSMENT: Visual tracking: Difficulty tracking black pen. Improved ability to track brightly colored marker to all 4 quadrants.  PERCEPTION: Not tested  PRAXIS: Not tested  OBSERVATIONS: Pt in w/c without prosthetics though with BLE prosthetics available nearby. Pt pleasant with decreased vision secondary to B retinopathy. Pt's friend assisted pt with w/c mobility today.                                                                                                                            TREATMENT DATE:    Self-Care  OT educated pt and friend on OT role, POC, safety when completing meal prep tasks - OT advised against standing on "nubs" d/t potential for fall risk around sharp objects such as knives, tub transfer bench vs shower chair, transfer board, clinic late/cancel/no-show policy, and overview of potential community resources such as support groups and hobby groups with accommodations. Pt verbalized understanding of all. OT to f/u with name/number of amputee support group.  PATIENT EDUCATION: Education details: see today's tx above Person educated: Patient and friend Education method: Explanation Education comprehension: verbalized understanding  HOME EXERCISE PROGRAM: TBD   GOALS: Goals reviewed with patient? Yes  SHORT TERM GOALS: Target date: 05/29/23  Pt will recall or demo at least 2 visual compensation strategies Baseline: impaired vision secondary to  diabetic retinopathy Goal status: INITIAL  2.  Pt will verbalize or demo understanding of safe simulated shower transfers modI with A/E and adaptive strategies PRN. Baseline: Pt ind though fearful and prefers supervision during showers and shower transfers  d/t unstable shower chair/bench. Pt has tub transfer bench and transfer board available.  Goal status: INITIAL  3.  Pt will ind demo understanding of LUE HEP. Baseline: New to outpt OT Goal status: INITIAL  4. Pt will verbalize understanding of community resources for individuals with amputations.  Baseline: new to outpt OT  Goal status: INITIAL  LONG TERM GOALS: Target date: 06/26/23  Pt will ind demo understanding of updated HEP. Baseline: new to outpt OT Goal status: INITIAL  2.  Patient will report at least two-point increase in average PSFS score or at least three-point increase in a single activity score indicating functionally significant improvement given minimum detectable change using BLE prosthetics PRN. Baseline: 6.7 total score (See above for individual activity scores) without use of BLE prosthetics.  Goal status: INITIAL  3.  Pt will demo at least 45 lbs LUE grip strength as needed to open jars/containers. Baseline: Grip strength: Right: 57 lbs; Left: 39 lbs Goal status: INITIAL  4.  Pt will complete simple meal prep tasks modI using BLE prosthetics as able. Baseline: pt beginning to trial use of BLE prosthetics. Pt reported difficulty with meal prep tasks currently. Goal status: INITIAL  ASSESSMENT:  CLINICAL IMPRESSION: Patient is a 33 y.o. female who was seen today for occupational therapy evaluation for S/P AKA (above knee amputation) bilateral. Hx includes DM, diabetic gastroparesis, GERD, HTN, hyperthyroidism. Patient currently presents below baseline level of functioning demonstrating functional deficits and impairments as noted below. Pt would benefit from skilled OT services in the outpatient setting to  work on impairments as noted below.  PERFORMANCE DEFICITS: in functional skills including ADLs, IADLs, coordination, dexterity, proprioception, edema, strength, flexibility, Fine motor control, Gross motor control, mobility, balance, body mechanics, endurance, decreased knowledge of use of DME, vision, and UE functional use, cognitive skills including energy/drive, and psychosocial skills including environmental adaptation.   IMPAIRMENTS: are limiting patient from ADLs, IADLs, rest and sleep, work, leisure, and social participation.   CO-MORBIDITIES: may have co-morbidities  that affects occupational performance. Patient will benefit from skilled OT to address above impairments and improve overall function.  MODIFICATION OR ASSISTANCE TO COMPLETE EVALUATION: Min-Moderate modification of tasks or assist with assess necessary to complete an evaluation.  OT OCCUPATIONAL PROFILE AND HISTORY: Detailed assessment: Review of records and additional review of physical, cognitive, psychosocial history related to current functional performance.  CLINICAL DECISION MAKING: Moderate - several treatment options, min-mod task modification necessary  REHAB POTENTIAL: Good  EVALUATION COMPLEXITY: Moderate    PLAN:  OT FREQUENCY: 2x/week  OT DURATION: 8 weeks  PLANNED INTERVENTIONS: 97168 OT Re-evaluation, 97535 self care/ADL training, 16109 therapeutic exercise, 97530 therapeutic activity, 97112 neuromuscular re-education, 97140 manual therapy, 97035 ultrasound, 97018 paraffin, 60454 fluidotherapy, 97010 moist heat, 97010 cryotherapy, 97034 contrast bath, 97760 Orthotics management and training, 09811 Splinting (initial encounter), [id number] Subsequent splinting/medication, passive range of motion, functional mobility training, visual/perceptual remediation/compensation, energy conservation, patient/family education, and DME and/or AE instructions  RECOMMENDED OTHER SERVICES: PT eval already  completed  CONSULTED AND AGREED WITH PLAN OF CARE: Patient  PLAN FOR NEXT SESSION:  LUE theraputty - pink or green (?mix) Community resources - support group, ?w/c accessible dance classes Vision compensation strategies Practice standing with BLE prosthetics - incorporate functional tasks as able   Wynetta Emery, OT 04/27/2023, 4:39 PM

## 2023-04-30 ENCOUNTER — Ambulatory Visit: Payer: 59

## 2023-05-01 ENCOUNTER — Telehealth: Payer: Self-pay | Admitting: *Deleted

## 2023-05-01 NOTE — Telephone Encounter (Signed)
 Left patient a message to call and schedule annual that is due after 06/16/2023.

## 2023-05-04 ENCOUNTER — Encounter: Payer: Self-pay | Admitting: Physical Therapy

## 2023-05-04 ENCOUNTER — Ambulatory Visit: Admitting: Occupational Therapy

## 2023-05-04 ENCOUNTER — Ambulatory Visit: Payer: 59 | Admitting: Physical Therapy

## 2023-05-04 DIAGNOSIS — R2689 Other abnormalities of gait and mobility: Secondary | ICD-10-CM

## 2023-05-04 DIAGNOSIS — M6281 Muscle weakness (generalized): Secondary | ICD-10-CM

## 2023-05-04 DIAGNOSIS — R293 Abnormal posture: Secondary | ICD-10-CM

## 2023-05-04 DIAGNOSIS — R29898 Other symptoms and signs involving the musculoskeletal system: Secondary | ICD-10-CM

## 2023-05-04 NOTE — Therapy (Signed)
 OUTPATIENT PHYSICAL THERAPY NEURO TREATMENT   Patient Name: Kelli Hudson MRN: [id number] DOB:[birth year], 33 y.o., female Today's Date: 05/04/2023   PCP: Cristino Martes, NP REFERRING PROVIDER: Aldean Baker, MD  END OF SESSION:  PT End of Session - 05/04/23 1324     Visit Number 6    Number of Visits 25    Date for PT Re-Evaluation 07/10/23    Authorization Type Medicare/Medicaid    Progress Note Due on Visit 10    PT Start Time 1320    PT Stop Time 1402    PT Time Calculation (min) 42 min    Equipment Utilized During Treatment Gait belt    Activity Tolerance Patient tolerated treatment well    Behavior During Therapy WFL for tasks assessed/performed             Past Medical History:  Diagnosis Date   Acute H. pylori gastric ulcer    Coffee ground emesis    Diabetes mellitus (HCC)    Diabetic gastroparesis (HCC)    DKA (diabetic ketoacidosis) (HCC) 02/24/2021   Gastroparesis    GERD (gastroesophageal reflux disease)    Hypertension    Hyperthyroidism    Intractable nausea and vomiting 04/20/2021   Normocytic anemia 04/20/2020   Prolonged Q-T interval on ECG    Past Surgical History:  Procedure Laterality Date   AMPUTATION Bilateral 10/24/2022   Procedure: BILATERAL BELOW KNEE AMPUTATION;  Surgeon: Nadara Mustard, MD;  Location: [facility] OR;  Service: Orthopedics;  Laterality: Bilateral;   AMPUTATION Bilateral 10/08/2022   Procedure: BILATERAL LEG DEBRIDEMENT;  Surgeon: Nadara Mustard, MD;  Location: [facility] OR;  Service: Orthopedics;  Laterality: Bilateral;   AMPUTATION Bilateral 11/14/2022   Procedure: BILATERAL ABOVE KNEE AMPUTATION;  Surgeon: Nadara Mustard, MD;  Location: [facility] OR;  Service: Orthopedics;  Laterality: Bilateral;   AMPUTATION TOE Left 03/10/2018   Procedure: AMPUTATION FIFTH TOE;  Surgeon: Vivi Barrack, DPM;  Location: [facility];  Service: Podiatry;  Laterality: Left;   APPLICATION OF WOUND VAC Bilateral 10/12/2022    Procedure: APPLICATION OF WOUND VAC BILATERAL LEGS;  Surgeon: Myrene Galas, MD;  Location: [facility] OR;  Service: Orthopedics;  Laterality: Bilateral;   APPLICATION OF WOUND VAC Bilateral 10/24/2022   Procedure: APPLICATION OF WOUND VAC;  Surgeon: Nadara Mustard, MD;  Location: [facility] OR;  Service: Orthopedics;  Laterality: Bilateral;   BIOPSY  01/28/2020   Procedure: BIOPSY;  Surgeon: Kathi Der, MD;  Location: [facility];  Service: Gastroenterology;;   BOTOX INJECTION  08/26/2021   Procedure: BOTOX INJECTION;  Surgeon: Sherrilyn Rist, MD;  Location: [facility];  Service: Gastroenterology;;   ESOPHAGOGASTRODUODENOSCOPY N/A 01/28/2020   Procedure: ESOPHAGOGASTRODUODENOSCOPY (EGD);  Surgeon: Kathi Der, MD;  Location: [facility];  Service: Gastroenterology;  Laterality: N/A;   ESOPHAGOGASTRODUODENOSCOPY (EGD) WITH PROPOFOL Left 09/08/2015   Procedure: ESOPHAGOGASTRODUODENOSCOPY (EGD) WITH PROPOFOL;  Surgeon: Willis Modena, MD;  Location: [facility] ENDOSCOPY;  Service: Endoscopy;  Laterality: Left;   ESOPHAGOGASTRODUODENOSCOPY (EGD) WITH PROPOFOL N/A 08/26/2021   Procedure: ESOPHAGOGASTRODUODENOSCOPY (EGD) WITH PROPOFOL;  Surgeon: Sherrilyn Rist, MD;  Location: [facility];  Service: Gastroenterology;  Laterality: N/A;   GASTRIC STIMULATOR IMPLANT SURGERY  06/2022   at [facility]   I & D EXTREMITY Bilateral 10/12/2022   Procedure: IRRIGATION AND  DEBRIDEMENT  BILATERAL LEGS;  Surgeon: Myrene Galas, MD;  Location: [facility] OR;  Service: Orthopedics;  Laterality: Bilateral;   I & D EXTREMITY Right 10/13/2022   Procedure: RIGHT THIGH WOUND  VAC EXCHANGE;  Surgeon: Roby Lofts, MD;  Location: [facility] OR;  Service: Orthopedics;  Laterality: Right;   I & D EXTREMITY Bilateral 10/17/2022   Procedure: BILATERAL THIGH AND LEG DEBRIDEMENT, PARTIAL BILATERAL FOOT AMPUTATION;  Surgeon: Nadara Mustard, MD;  Location: [facility] OR;  Service: Orthopedics;  Laterality: Bilateral;   I & D EXTREMITY Bilateral 11/05/2022    Procedure: BILATERAL THIGH DEBRIDEMENT;  Surgeon: Nadara Mustard, MD;  Location: [facility] OR;  Service: Orthopedics;  Laterality: Bilateral;   I & D EXTREMITY Bilateral 11/07/2022   Procedure: BILATERAL THIGH AND LEG DEBRIDEMENT;  Surgeon: Nadara Mustard, MD;  Location: [facility] OR;  Service: Orthopedics;  Laterality: Bilateral;   I & D EXTREMITY Bilateral 11/12/2022   Procedure: BILATERAL LEG DEBRIDEMENTS;  Surgeon: Nadara Mustard, MD;  Location: [facility] OR;  Service: Orthopedics;  Laterality: Bilateral;   IRRIGATION AND DEBRIDEMENT FOOT Bilateral 10/05/2022   Procedure: IRRIGATION AND DEBRIDEMENT FOOT;  Surgeon: [name], DPM;  Location: [facility];  Service: Orthopedics/Podiatry;  Laterality: Bilateral;   WISDOM TOOTH EXTRACTION     Patient Active Problem List   Diagnosis Date Noted   Influenza A 04/05/2023   Elevated lipase 04/05/2023   Esophagitis 03/24/2023   SIRS (systemic inflammatory response syndrome) (HCC) 03/02/2023   Fever 03/02/2023   Metabolic acidosis 03/02/2023   Hypertensive urgency 02/21/2023   Chronic pain 02/21/2023   Abscess of thigh 11/28/2022   C. 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   Hypokalemia 11/27/2022   Effusion, left knee 11/05/2022   MDD (major depressive disorder), recurrent episode, moderate (HCC) 10/16/2022   Necrotizing fasciitis of pelvic region and thigh (HCC) 10/08/2022   Necrotizing fasciitis of lower leg (HCC) 10/08/2022   Necrotizing fasciitis (HCC) 10/08/2022   Sepsis due to cellulitis (HCC) 10/03/2022   Cellulitis of lower extremity 10/03/2022   Multinodular goiter 06/18/2022   Malnutrition of moderate degree 04/18/2022   Intractable vomiting with nausea 04/17/2022   DKA (diabetic ketoacidosis) (HCC) 03/04/2022   Nausea vomiting and diarrhea 12/25/2021   Diabetic gastroparesis (HCC) 10/09/2021   Drug-seeking behavior 09/21/2021   Essential hypertension 08/25/2021   Diabetes mellitus type 1 with  complications (HCC) 08/25/2021   GERD without esophagitis 08/25/2021   Hematemesis    Prolonged Q-T interval on ECG    Nausea and vomiting 07/20/2021   Anemia of chronic disease 06/06/2021   Dehydration 06/04/2021   Hypomagnesemia 04/21/2021   Ulcerated, foot, left, with fat layer exposed (HCC) 04/20/2021   Uncontrolled type 1 diabetes mellitus with hyperglycemia, with long-term current use of insulin (HCC) 12/18/2020   DM type 1 (diabetes mellitus, type 1) (HCC) 12/18/2020   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) 11/09/2020   Diabetic retinopathy of both eyes associated with type 1 diabetes mellitus (HCC) 09/24/2020   Diabetic gastroparesis associated with type 1 diabetes mellitus (HCC) 08/12/2020   Acute kidney injury superimposed on chronic kidney disease (HCC) 07/25/2020   Generalized abdominal pain 07/23/2020   GERD (gastroesophageal reflux disease) 07/23/2020   Diabetic nephropathy associated with type 1 diabetes mellitus (HCC) 06/27/2020   Sepsis (HCC) 05/27/2020   HLD (hyperlipidemia) 05/23/2020   Sinus tachycardia 05/09/2020   Anemia 04/20/2020   Depression with anxiety 07/05/2019   Diabetic polyneuropathy associated with type 1 diabetes mellitus (HCC) 09/24/2017   Gastroparesis    Goiter 10/05/2009   DM2 (diabetes mellitus, type 2) (HCC) 10/05/2009   Hypertension associated with diabetes (HCC)  10/05/2009    ONSET DATE: 04/07/23 referral  REFERRING DIAG: 4401111534 (ICD-10-CM) - S/P AKA (above knee amputation) bilateral (HCC)   THERAPY DIAG:  Muscle weakness (generalized)  Other symptoms and signs involving the musculoskeletal system  Abnormal posture  Other abnormalities of gait and mobility  Rationale for Evaluation and Treatment: Rehabilitation  SUBJECTIVE:                                                                                                                                                                                              SUBJECTIVE STATEMENT: Patient arrives to clinic, no changes. Does have swollen B eyes, R >L - this issue remains and pt states she is having difficulty opening the right eye, she has PCP appt tomorrow for this.  Denies falls.  She is scheduled to see [facility] this Wednesday and states she had her dates mixed up last session. Pt accompanied by: friend  PERTINENT HISTORY: DM, diabetic gastroparesis, GERD, HTN, hyperthyroidism  PAIN:  Are you having pain? No  VITALS: There were no vitals filed for this visit.    PRECAUTIONS: Fall; B diabetic retinopathy  PATIENT GOALS: "to be able to walk again"                                                                                                                              TREATMENT  -don/doff prosthetics with supervision, initially without sock to assess socket fit and alignment, left foot inverted compared to socket alignment; re-donned w/ 3-ply bilaterally; increased time so pt can feel for placement of lanyard; discussed ER of right LE, upon assessment socket fits well w/ 3-ply and initial liner placement is centered so PT unsure if this is natural anatomic variance or need for adjustment - continue to monitor w/ increased activity. -Confirmed she knows what to speak to [facility] about in regards to socks and IR of left prosthetic foot component.  -Reviewed sink HEP (modified version using available second wheelchair against couch due to home setup), see below for further.  PATIENT EDUCATION: Education details: Modified sink HEP  w/ supervision as is available for home. Person educated: Patient and friend Education method: Medical illustrator Education comprehension: verbalized understanding and needs further education  HOME EXERCISE PROGRAM: -practice donning/doffing prosthetic Access Code: [email or web address] URL: [email or web address] Date: 04/20/2023 Prepared by: Merry Lofty  Exercises - Prone Hip  Extension with Residual Limb (BKA)  - 1 x daily - 7 x weekly - 3 sets - 10 reps - Sidelying Hip Abduction (AKA)  - 1 x daily - 7 x weekly - 3 sets - 10 reps - Supine Piriformis Stretch with Residual Limb (AKA)  - 1 x daily - 7 x weekly - 3 sets - 10 reps  Do each exercise 1-2  times per day Do each exercise 10 repetitions Hold each exercise for 2 seconds to feel your location  AT SINK FIND YOUR MIDLINE POSITION AND PLACE FEET EQUAL DISTANCE FROM THE MIDLINE.  Try to find this position when standing still for activities.   USE TAPE ON FLOOR TO MARK THE MIDLINE POSITION. You also should try to feel with your limb pressure in socket.  You are trying to feel with limb what you used to feel with the bottom of your foot.  Side to Side Shift: Moving your hips only (not shoulders): move weight onto your left leg, HOLD/FEEL.  Move back to equal weight on each leg, HOLD/FEEL. Move weight onto your right leg, HOLD/FEEL. Move back to equal weight on each leg, HOLD/FEEL. Repeat.  Start with both hands on sink, progress to right hand only, then no hands.  Front to Back Shift: Moving your hips only (not shoulders): move your weight forward onto your toes, HOLD/FEEL. Move your weight back to equal Flat Foot on both legs, HOLD/FEEL. Move your weight back onto your heels, HOLD/FEEL. Move your weight back to equal on both legs, HOLD/FEEL. Repeat.   tart with both hands on sink, progress to right hand only, then no hands. Moving Cones / Cups: With equal weight on each leg: Hold on with one hand the first time, then progress to no hand supports. Move cups from one side of sink to the other. Place cups ~2" out of your reach, progress to 10" beyond reach.  Place one hand in middle of sink and reach with other hand. Do both arms.  Then hover one hand and move cups with other hand. -just use a crossbody reach to end of safe weight shift ROM Overhead/Upward Reaching: alternated reaching up to top cabinets or ceiling if no  cabinets present. Keep equal weight on each leg. Start with one hand support on counter while other hand reaches and progress to no hand support with reaching.  ace one hand in middle of sink and reach with other hand. Do both arms.  Then hover one hand and move cups with other hand.  5.   Looking Over Shoulders: With equal weight on each leg: alternate turning to look over your shoulders with one hand support on counter as needed.  Start with head motions only to look in front of shoulder, then even with shoulder and progress to looking behind you. To look to side, move head /eyes, then shoulder on side looking pulls back, shift more weight to side looking and pull hip back. ace one hand in middle of sink and let go with other hand so your shoulder can pull back. Switch hands to look other way.   Then hover one hand and move cups with other hand.  6.  Stepping with leg that  is not amputated:  Move items under cabinet out of your way. Shift your hips/pelvis so weight on prosthesis. SLOWLY step other leg so front of foot is in cabinet. Then step back to floor. - just hike the hip and place the foot back in the starting spot  GOALS: Goals reviewed with patient? Yes  SHORT TERM GOALS: Target date: 05/15/23  Pt will be independent with initial HEP for improved gait mechanics and balance  Baseline: to be updated Goal status: INITIAL  2.  Patient will ambulate at least 48ft with LRAD and no more than MinA to demonstrate improved mobility  Baseline: 35ft // bars + MinA Goal status: INITIAL  3.  Patient will complete sit <> stand with LRAD and no more than CGA for improved independence with transfers Baseline: MinA Goal status: INITIAL  4.  Patient will complete floor transfer with assist as initial step of fall recovery  Baseline: unable to complete on eval Goal status: INITIAL   LONG TERM GOALS: Target date: 07/10/23  Pt will be independent with final HEP for improved gait mechanics and balance   Baseline: to be updated Goal status: INITIAL  Patient will ambulate at least 57ft with LRAD, ModI to demonstrate improved mobility  Baseline: 6ft // bars + MinA Goal status: INITIAL  Patient will complete sit <> stand with LRAD. ModI for improved independence with transfers Baseline: MinA Goal status: INITIAL  Patient will complete floor transfer without assist as initial step of fall recovery  Baseline: unable to complete on eval Goal status: INITIAL  5. Patient will don/doff prosthetics independently   Baseline: MaxA  Goal status: INITIAL  ASSESSMENT:  CLINICAL IMPRESSION: Ongoing assessment of prosthetic fit and alignment today.  Pt to see [facility] for likely needed left prosthetic foot alignment and additional sock ply to improve variability of socket fit.  She is seeing her primary care provider tomorrow for follow-up to recent eye swelling without acute change this visit.  She continues to require increased UE support for standing stability with sink HEP modified for supervised home use this visit.  Will continue per POC.  OBJECTIVE IMPAIRMENTS: Abnormal gait, decreased activity tolerance, decreased balance, decreased endurance, decreased knowledge of condition, decreased knowledge of use of DME, decreased strength, impaired vision/preception, and prosthetic dependency .   ACTIVITY LIMITATIONS: carrying, lifting, standing, squatting, transfers, hygiene/grooming, locomotion level, and caring for others  PARTICIPATION LIMITATIONS: meal prep, cleaning, interpersonal relationship, driving, shopping, community activity, and occupation  PERSONAL FACTORS: Age, Behavior pattern, Past/current experiences, Social background, Time since onset of injury/illness/exacerbation, Transportation, and 3+ comorbidities: see above  are also affecting patient's functional outcome.   REHAB POTENTIAL: Good  CLINICAL DECISION MAKING: Stable/uncomplicated  EVALUATION COMPLEXITY: Low  PLAN:  PT  FREQUENCY: 2x/week  PT DURATION: 12 weeks  PLANNED INTERVENTIONS: 97164- PT Re-evaluation, 97110-Therapeutic exercises, 97530- Therapeutic activity, 97112- Neuromuscular re-education, 97535- Self Care, 41324- Manual therapy, [phone]- Gait training, [phone]- Prosthetic training, [phone]- Aquatic Therapy, Patient/Family education, Balance training, Stair training, Scar mobilization, Vestibular training, Visual/preceptual remediation/compensation, DME instructions, and Wheelchair mobility training  PLAN FOR NEXT SESSION: Gait, balance, floor transfers, donning/doffing prosthetics, how is modified sink HEP?, balance recovery in standing/static stability w/ dec UE reliance   Sadie Haber, PT, DPT   05/04/2023, 5:50 PM

## 2023-05-04 NOTE — Patient Instructions (Signed)
 Do each exercise 1-2  times per day Do each exercise 10 repetitions Hold each exercise for 2 seconds to feel your location  AT SINK FIND YOUR MIDLINE POSITION AND PLACE FEET EQUAL DISTANCE FROM THE MIDLINE.  Try to find this position when standing still for activities.   USE TAPE ON FLOOR TO MARK THE MIDLINE POSITION. You also should try to feel with your limb pressure in socket.  You are trying to feel with limb what you used to feel with the bottom of your foot.  Side to Side Shift: Moving your hips only (not shoulders): move weight onto your left leg, HOLD/FEEL.  Move back to equal weight on each leg, HOLD/FEEL. Move weight onto your right leg, HOLD/FEEL. Move back to equal weight on each leg, HOLD/FEEL. Repeat.  Start with both hands on sink, progress to right hand only, then no hands.  Front to Back Shift: Moving your hips only (not shoulders): move your weight forward onto your toes, HOLD/FEEL. Move your weight back to equal Flat Foot on both legs, HOLD/FEEL. Move your weight back onto your heels, HOLD/FEEL. Move your weight back to equal on both legs, HOLD/FEEL. Repeat.   tart with both hands on sink, progress to right hand only, then no hands. Moving Cones / Cups: With equal weight on each leg: Hold on with one hand the first time, then progress to no hand supports. Move cups from one side of sink to the other. Place cups ~2" out of your reach, progress to 10" beyond reach.  Place one hand in middle of sink and reach with other hand. Do both arms.  Then hover one hand and move cups with other hand. -just use a crossbody reach to end of safe weight shift ROM Overhead/Upward Reaching: alternated reaching up to top cabinets or ceiling if no cabinets present. Keep equal weight on each leg. Start with one hand support on counter while other hand reaches and progress to no hand support with reaching.  ace one hand in middle of sink and reach with other hand. Do both arms.  Then hover one hand and move  cups with other hand.  5.   Looking Over Shoulders: With equal weight on each leg: alternate turning to look over your shoulders with one hand support on counter as needed.  Start with head motions only to look in front of shoulder, then even with shoulder and progress to looking behind you. To look to side, move head /eyes, then shoulder on side looking pulls back, shift more weight to side looking and pull hip back. ace one hand in middle of sink and let go with other hand so your shoulder can pull back. Switch hands to look other way.   Then hover one hand and move cups with other hand.  6.  Stepping with leg that is not amputated:  Move items under cabinet out of your way. Shift your hips/pelvis so weight on prosthesis. SLOWLY step other leg so front of foot is in cabinet. Then step back to floor. - just hike the hip and place the foot back in the starting spot

## 2023-05-07 ENCOUNTER — Ambulatory Visit: Payer: 59 | Admitting: Physical Therapy

## 2023-05-07 ENCOUNTER — Ambulatory Visit: Admitting: Occupational Therapy

## 2023-05-11 ENCOUNTER — Encounter: Payer: Self-pay | Admitting: Physical Therapy

## 2023-05-11 ENCOUNTER — Ambulatory Visit: Payer: 59 | Admitting: Physical Therapy

## 2023-05-11 DIAGNOSIS — R293 Abnormal posture: Secondary | ICD-10-CM

## 2023-05-11 DIAGNOSIS — R2689 Other abnormalities of gait and mobility: Secondary | ICD-10-CM

## 2023-05-11 DIAGNOSIS — R29898 Other symptoms and signs involving the musculoskeletal system: Secondary | ICD-10-CM

## 2023-05-11 DIAGNOSIS — M6281 Muscle weakness (generalized): Secondary | ICD-10-CM

## 2023-05-11 NOTE — Therapy (Signed)
 OUTPATIENT PHYSICAL THERAPY NEURO TREATMENT   Patient Name: Kelli Hudson MRN: [id number] DOB:1990-03-23, 33 y.o., female Today's Date: 05/11/2023   PCP: Cristino Martes, NP REFERRING PROVIDER: Aldean Baker, MD  END OF SESSION:  PT End of Session - 05/11/23 1322     Visit Number 7    Number of Visits 25    Date for PT Re-Evaluation 07/10/23    Authorization Type Medicare/Medicaid    Progress Note Due on Visit 10    PT Start Time 1315    PT Stop Time 1405    PT Time Calculation (min) 50 min    Equipment Utilized During Treatment Gait belt    Activity Tolerance Patient tolerated treatment well    Behavior During Therapy WFL for tasks assessed/performed             Past Medical History:  Diagnosis Date   Acute H. pylori gastric ulcer    Coffee ground emesis    Diabetes mellitus (HCC)    Diabetic gastroparesis (HCC)    DKA (diabetic ketoacidosis) (HCC) 02/24/2021   Gastroparesis    GERD (gastroesophageal reflux disease)    Hypertension    Hyperthyroidism    Intractable nausea and vomiting 04/20/2021   Normocytic anemia 04/20/2020   Prolonged Q-T interval on ECG    Past Surgical History:  Procedure Laterality Date   AMPUTATION Bilateral 10/24/2022   Procedure: BILATERAL BELOW KNEE AMPUTATION;  Surgeon: Nadara Mustard, MD;  Location: [facility] OR;  Service: Orthopedics;  Laterality: Bilateral;   AMPUTATION Bilateral 10/08/2022   Procedure: BILATERAL LEG DEBRIDEMENT;  Surgeon: Nadara Mustard, MD;  Location: [facility] OR;  Service: Orthopedics;  Laterality: Bilateral;   AMPUTATION Bilateral 11/14/2022   Procedure: BILATERAL ABOVE KNEE AMPUTATION;  Surgeon: Nadara Mustard, MD;  Location: [facility] OR;  Service: Orthopedics;  Laterality: Bilateral;   AMPUTATION TOE Left 03/10/2018   Procedure: AMPUTATION FIFTH TOE;  Surgeon: Vivi Barrack, DPM;  Location: [facility];  Service: Podiatry;  Laterality: Left;   APPLICATION OF WOUND VAC Bilateral 10/12/2022    Procedure: APPLICATION OF WOUND VAC BILATERAL LEGS;  Surgeon: Myrene Galas, MD;  Location: [facility] OR;  Service: Orthopedics;  Laterality: Bilateral;   APPLICATION OF WOUND VAC Bilateral 10/24/2022   Procedure: APPLICATION OF WOUND VAC;  Surgeon: Nadara Mustard, MD;  Location: [facility] OR;  Service: Orthopedics;  Laterality: Bilateral;   BIOPSY  01/28/2020   Procedure: BIOPSY;  Surgeon: Kathi Der, MD;  Location: [facility];  Service: Gastroenterology;;   BOTOX INJECTION  08/26/2021   Procedure: BOTOX INJECTION;  Surgeon: Sherrilyn Rist, MD;  Location: [facility];  Service: Gastroenterology;;   ESOPHAGOGASTRODUODENOSCOPY N/A 01/28/2020   Procedure: ESOPHAGOGASTRODUODENOSCOPY (EGD);  Surgeon: Kathi Der, MD;  Location: [facility];  Service: Gastroenterology;  Laterality: N/A;   ESOPHAGOGASTRODUODENOSCOPY (EGD) WITH PROPOFOL Left 09/08/2015   Procedure: ESOPHAGOGASTRODUODENOSCOPY (EGD) WITH PROPOFOL;  Surgeon: Willis Modena, MD;  Location: [facility] ENDOSCOPY;  Service: Endoscopy;  Laterality: Left;   ESOPHAGOGASTRODUODENOSCOPY (EGD) WITH PROPOFOL N/A 08/26/2021   Procedure: ESOPHAGOGASTRODUODENOSCOPY (EGD) WITH PROPOFOL;  Surgeon: Sherrilyn Rist, MD;  Location: [facility];  Service: Gastroenterology;  Laterality: N/A;   GASTRIC STIMULATOR IMPLANT SURGERY  06/2022   at [facility]   I & D EXTREMITY Bilateral 10/12/2022   Procedure: IRRIGATION AND  DEBRIDEMENT  BILATERAL LEGS;  Surgeon: Myrene Galas, MD;  Location: [facility] OR;  Service: Orthopedics;  Laterality: Bilateral;   I & D EXTREMITY Right 10/13/2022   Procedure: RIGHT THIGH WOUND  VAC EXCHANGE;  Surgeon: Roby Lofts, MD;  Location: [facility] OR;  Service: Orthopedics;  Laterality: Right;   I & D EXTREMITY Bilateral 10/17/2022   Procedure: BILATERAL THIGH AND LEG DEBRIDEMENT, PARTIAL BILATERAL FOOT AMPUTATION;  Surgeon: Nadara Mustard, MD;  Location: [facility] OR;  Service: Orthopedics;  Laterality: Bilateral;   I & D EXTREMITY Bilateral 11/05/2022    Procedure: BILATERAL THIGH DEBRIDEMENT;  Surgeon: Nadara Mustard, MD;  Location: [facility] OR;  Service: Orthopedics;  Laterality: Bilateral;   I & D EXTREMITY Bilateral 11/07/2022   Procedure: BILATERAL THIGH AND LEG DEBRIDEMENT;  Surgeon: Nadara Mustard, MD;  Location: [facility] OR;  Service: Orthopedics;  Laterality: Bilateral;   I & D EXTREMITY Bilateral 11/12/2022   Procedure: BILATERAL LEG DEBRIDEMENTS;  Surgeon: Nadara Mustard, MD;  Location: [facility] OR;  Service: Orthopedics;  Laterality: Bilateral;   IRRIGATION AND DEBRIDEMENT FOOT Bilateral 10/05/2022   Procedure: IRRIGATION AND DEBRIDEMENT FOOT;  Surgeon: [name], DPM;  Location: [facility];  Service: Orthopedics/Podiatry;  Laterality: Bilateral;   WISDOM TOOTH EXTRACTION     Patient Active Problem List   Diagnosis Date Noted   Influenza A 04/05/2023   Elevated lipase 04/05/2023   Esophagitis 03/24/2023   SIRS (systemic inflammatory response syndrome) (HCC) 03/02/2023   Fever 03/02/2023   Metabolic acidosis 03/02/2023   Hypertensive urgency 02/21/2023   Chronic pain 02/21/2023   Abscess of thigh 11/28/2022   C. 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   Hypokalemia 11/27/2022   Effusion, left knee 11/05/2022   MDD (major depressive disorder), recurrent episode, moderate (HCC) 10/16/2022   Necrotizing fasciitis of pelvic region and thigh (HCC) 10/08/2022   Necrotizing fasciitis of lower leg (HCC) 10/08/2022   Necrotizing fasciitis (HCC) 10/08/2022   Sepsis due to cellulitis (HCC) 10/03/2022   Cellulitis of lower extremity 10/03/2022   Multinodular goiter 06/18/2022   Malnutrition of moderate degree 04/18/2022   Intractable vomiting with nausea 04/17/2022   DKA (diabetic ketoacidosis) (HCC) 03/04/2022   Nausea vomiting and diarrhea 12/25/2021   Diabetic gastroparesis (HCC) 10/09/2021   Drug-seeking behavior 09/21/2021   Essential hypertension 08/25/2021   Diabetes mellitus type 1 with  complications (HCC) 08/25/2021   GERD without esophagitis 08/25/2021   Hematemesis    Prolonged Q-T interval on ECG    Nausea and vomiting 07/20/2021   Anemia of chronic disease 06/06/2021   Dehydration 06/04/2021   Hypomagnesemia 04/21/2021   Ulcerated, foot, left, with fat layer exposed (HCC) 04/20/2021   Uncontrolled type 1 diabetes mellitus with hyperglycemia, with long-term current use of insulin (HCC) 12/18/2020   DM type 1 (diabetes mellitus, type 1) (HCC) 12/18/2020   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) 11/09/2020   Diabetic retinopathy of both eyes associated with type 1 diabetes mellitus (HCC) 09/24/2020   Diabetic gastroparesis associated with type 1 diabetes mellitus (HCC) 08/12/2020   Acute kidney injury superimposed on chronic kidney disease (HCC) 07/25/2020   Generalized abdominal pain 07/23/2020   GERD (gastroesophageal reflux disease) 07/23/2020   Diabetic nephropathy associated with type 1 diabetes mellitus (HCC) 06/27/2020   Sepsis (HCC) 05/27/2020   HLD (hyperlipidemia) 05/23/2020   Sinus tachycardia 05/09/2020   Anemia 04/20/2020   Depression with anxiety 07/05/2019   Diabetic polyneuropathy associated with type 1 diabetes mellitus (HCC) 09/24/2017   Gastroparesis    Goiter 10/05/2009   DM2 (diabetes mellitus, type 2) (HCC) 10/05/2009   Hypertension associated with diabetes (HCC)  10/05/2009    ONSET DATE: 04/07/23 referral  REFERRING DIAG: [phone] (ICD-10-CM) - S/P AKA (above knee amputation) bilateral (HCC)   THERAPY DIAG:  Muscle weakness (generalized)  Other symptoms and signs involving the musculoskeletal system  Abnormal posture  Other abnormalities of gait and mobility  Rationale for Evaluation and Treatment: Rehabilitation  SUBJECTIVE:                                                                                                                                                                                              SUBJECTIVE STATEMENT: Patient arrives to clinic in wheelchair w/ prosthetics doffed with friend, Kenard Gower. She saw her PCP about eye puffiness, reports this was a stye and she has been using medicated eye drops.  Denies falls.  She saw Trey Paula at [facility], received her 5-ply and reports that she has a follow-up around May 8 and if she is comfortable by that point he will add joints to her prosthetics.  She has not felt safe doing sink HEP at home by herself so has not attempted this since last visit. Pt accompanied by: friend  PERTINENT HISTORY: DM, diabetic gastroparesis, GERD, HTN, hyperthyroidism  PAIN:  Are you having pain? No  VITALS: There were no vitals filed for this visit.    PRECAUTIONS: Fall; B diabetic retinopathy  PATIENT GOALS: "to be able to walk again"                                                                                                                              TREATMENT -Pt dons bilateral 5-ply and prosthetics supervision level, discussed folding socks over top of socket due to excess length vs cutting -Pull-to-stand at ballet bar using BUE underhand and overhand alternating grip to laterally step 4x6 ft using top and bottom ballet bar CGA w/ increased time to place foot, pt becomes tearful briefly requiring standing rest citing frustration with difficulty moving her legs - PT provided therapeutic listening and encouragement. -Pt has ER of RLE during side stepping, discussed possible need to add sock, pt does not  have additional socks with her currently - encouraged to bringing bag with multiple options to each session -Practiced modified sink HEP (weight shifts laterally and fwd/backward only initially) using forward arm rest support of second wheelchair - discussed concerns for pulling to stand and pulling wheelchair over and possible need for second person to hold chair -Repeated STS x5 using push-to-stand with improvement in both stand and return to  sit with ~3 inch gap between seat and back of legs, pt is strong in triceps and able to pull into chair SBA - friend observes practice for help at home as he is able to be there briefly sometimes -Reviewed sink HEP tasks 1-5 again during last stand to back of wheelchair performing several reps each at SBA, some posterior tipping of chair w/ severe weight shift, but pt able to self-correct w/ cuing to reduce ROM.  Modified crossbody reaching vs moving objects crossbody and had pt progress from UE lifts to overhead reaching with repetition SBA -Pt returns to sitting and doffs bilateral prosthetics independently  PATIENT EDUCATION: Education details: Modified sink HEP w/ supervision as is available for home.  Discussed adding socks to prevent right ER and need for frequent lanyard adjustment in standing. Person educated: Patient and friend Education method: Medical illustrator Education comprehension: verbalized understanding and needs further education  HOME EXERCISE PROGRAM: -practice donning/doffing prosthetic Access Code: [email or web address] URL: [email or web address] Date: 04/20/2023 Prepared by: Merry Lofty  Exercises - Prone Hip Extension with Residual Limb (BKA)  - 1 x daily - 7 x weekly - 3 sets - 10 reps - Sidelying Hip Abduction (AKA)  - 1 x daily - 7 x weekly - 3 sets - 10 reps - Supine Piriformis Stretch with Residual Limb (AKA)  - 1 x daily - 7 x weekly - 3 sets - 10 reps  Do each exercise 1-2  times per day Do each exercise 10 repetitions Hold each exercise for 2 seconds to feel your location  AT SINK FIND YOUR MIDLINE POSITION AND PLACE FEET EQUAL DISTANCE FROM THE MIDLINE.  Try to find this position when standing still for activities.   USE TAPE ON FLOOR TO MARK THE MIDLINE POSITION. You also should try to feel with your limb pressure in socket.  You are trying to feel with limb what you used to feel with the bottom of your foot.  Side to Side Shift:  Moving your hips only (not shoulders): move weight onto your left leg, HOLD/FEEL.  Move back to equal weight on each leg, HOLD/FEEL. Move weight onto your right leg, HOLD/FEEL. Move back to equal weight on each leg, HOLD/FEEL. Repeat.  Start with both hands on sink, progress to right hand only, then no hands.  Front to Back Shift: Moving your hips only (not shoulders): move your weight forward onto your toes, HOLD/FEEL. Move your weight back to equal Flat Foot on both legs, HOLD/FEEL. Move your weight back onto your heels, HOLD/FEEL. Move your weight back to equal on both legs, HOLD/FEEL. Repeat.   tart with both hands on sink, progress to right hand only, then no hands. Moving Cones / Cups: With equal weight on each leg: Hold on with one hand the first time, then progress to no hand supports. Move cups from one side of sink to the other. Place cups ~2" out of your reach, progress to 10" beyond reach.  Place one hand in middle of sink and reach with other hand. Do both arms.  Then hover one hand and  move cups with other hand. -just use a crossbody reach to end of safe weight shift ROM Overhead/Upward Reaching: alternated reaching up to top cabinets or ceiling if no cabinets present. Keep equal weight on each leg. Start with one hand support on counter while other hand reaches and progress to no hand support with reaching.  ace one hand in middle of sink and reach with other hand. Do both arms.  Then hover one hand and move cups with other hand.  5.   Looking Over Shoulders: With equal weight on each leg: alternate turning to look over your shoulders with one hand support on counter as needed.  Start with head motions only to look in front of shoulder, then even with shoulder and progress to looking behind you. To look to side, move head /eyes, then shoulder on side looking pulls back, shift more weight to side looking and pull hip back. ace one hand in middle of sink and let go with other hand so your shoulder  can pull back. Switch hands to look other way.   Then hover one hand and move cups with other hand.  6.  Stepping with leg that is not amputated:  Move items under cabinet out of your way. Shift your hips/pelvis so weight on prosthesis. SLOWLY step other leg so front of foot is in cabinet. Then step back to floor. - just hike the hip and place the foot back in the starting spot  GOALS: Goals reviewed with patient? Yes  SHORT TERM GOALS: Target date: 05/15/23  Pt will be independent with initial HEP for improved gait mechanics and balance  Baseline: to be updated Goal status: INITIAL  2.  Patient will ambulate at least 47ft with LRAD and no more than MinA to demonstrate improved mobility  Baseline: 68ft // bars + MinA Goal status: INITIAL  3.  Patient will complete sit <> stand with LRAD and no more than CGA for improved independence with transfers Baseline: MinA Goal status: INITIAL  4.  Patient will complete floor transfer with assist as initial step of fall recovery  Baseline: unable to complete on eval Goal status: INITIAL   LONG TERM GOALS: Target date: 07/10/23  Pt will be independent with final HEP for improved gait mechanics and balance  Baseline: to be updated Goal status: INITIAL  Patient will ambulate at least 31ft with LRAD, ModI to demonstrate improved mobility  Baseline: 36ft // bars + MinA Goal status: INITIAL  Patient will complete sit <> stand with LRAD. ModI for improved independence with transfers Baseline: MinA Goal status: INITIAL  Patient will complete floor transfer without assist as initial step of fall recovery  Baseline: unable to complete on eval Goal status: INITIAL  5. Patient will don/doff prosthetics independently   Baseline: MaxA  Goal status: INITIAL  ASSESSMENT:  CLINICAL IMPRESSION: Focus of skilled session today on reviewing home setup and performance of modified sink HEP.  Pt was instructed to initially practice safety with setup of  task at home even if she is afraid to attempt the tasks themselves following setup.  Discussed having supervision if able and practicing standing to back of wheelchair for standing tolerance and supported static balance.  Pt is making progress towards goals, but is limited by some functional hip weakness and ongoing need for practice with sock management to adjust prosthetic fit.  PT to continue working towards floor transfer goal and STG assessment due at next visit.  Continue per POC.  OBJECTIVE IMPAIRMENTS: Abnormal  gait, decreased activity tolerance, decreased balance, decreased endurance, decreased knowledge of condition, decreased knowledge of use of DME, decreased strength, impaired vision/preception, and prosthetic dependency .   ACTIVITY LIMITATIONS: carrying, lifting, standing, squatting, transfers, hygiene/grooming, locomotion level, and caring for others  PARTICIPATION LIMITATIONS: meal prep, cleaning, interpersonal relationship, driving, shopping, community activity, and occupation  PERSONAL FACTORS: Age, Behavior pattern, Past/current experiences, Social background, Time since onset of injury/illness/exacerbation, Transportation, and 3+ comorbidities: see above  are also affecting patient's functional outcome.   REHAB POTENTIAL: Good  CLINICAL DECISION MAKING: Stable/uncomplicated  EVALUATION COMPLEXITY: Low  PLAN:  PT FREQUENCY: 2x/week  PT DURATION: 12 weeks  PLANNED INTERVENTIONS: 97164- PT Re-evaluation, 97110-Therapeutic exercises, 97530- Therapeutic activity, 97112- Neuromuscular re-education, 97535- Self Care, 60454- Manual therapy, [phone]- Gait training, [phone]- Prosthetic training, [phone]- Aquatic Therapy, Patient/Family education, Balance training, Stair training, Scar mobilization, Vestibular training, Visual/preceptual remediation/compensation, DME instructions, and Wheelchair mobility training  PLAN FOR NEXT SESSION: ASSESS STGs!  Gait, balance, floor transfers (will  likely need +2), donning/doffing prosthetics, how is modified sink HEP?, balance recovery in standing/static stability w/ dec UE reliance   Sadie Haber, PT, DPT   05/11/2023, 4:22 PM

## 2023-05-14 ENCOUNTER — Ambulatory Visit: Admitting: Occupational Therapy

## 2023-05-14 ENCOUNTER — Other Ambulatory Visit: Payer: Self-pay

## 2023-05-14 ENCOUNTER — Ambulatory Visit: Payer: 59 | Admitting: Physical Therapy

## 2023-05-14 ENCOUNTER — Emergency Department (HOSPITAL_COMMUNITY): Admission: EM | Admit: 2023-05-14 | Discharge: 2023-05-14 | Disposition: A | Discharge status: Home / Self Care

## 2023-05-14 VITALS — BP 183/118 | HR 94

## 2023-05-14 DIAGNOSIS — R29898 Other symptoms and signs involving the musculoskeletal system: Secondary | ICD-10-CM

## 2023-05-14 DIAGNOSIS — E119 Type 2 diabetes mellitus without complications: Secondary | ICD-10-CM | POA: Diagnosis not present

## 2023-05-14 DIAGNOSIS — I10 Essential (primary) hypertension: Secondary | ICD-10-CM | POA: Insufficient documentation

## 2023-05-14 DIAGNOSIS — R2689 Other abnormalities of gait and mobility: Secondary | ICD-10-CM

## 2023-05-14 DIAGNOSIS — Z79899 Other long term (current) drug therapy: Secondary | ICD-10-CM | POA: Diagnosis not present

## 2023-05-14 DIAGNOSIS — Z794 Long term (current) use of insulin: Secondary | ICD-10-CM | POA: Diagnosis not present

## 2023-05-14 DIAGNOSIS — M6281 Muscle weakness (generalized): Secondary | ICD-10-CM

## 2023-05-14 DIAGNOSIS — R293 Abnormal posture: Secondary | ICD-10-CM

## 2023-05-14 MED ORDER — AMLODIPINE BESYLATE 5 MG PO TABS
10.0000 mg | ORAL_TABLET | Freq: Once | ORAL | Status: AC
  Administered 2023-05-14: 10 mg via ORAL
  Filled 2023-05-14: qty 2

## 2023-05-14 NOTE — Discharge Instructions (Signed)
 Please follow-up with your primary care doctor to discuss blood pressure management if you continue to have elevated blood pressure earlier today, you are safe to return to rehab from the emergency department perspective however.

## 2023-05-14 NOTE — ED Provider Notes (Signed)
 [place] EMERGENCY DEPARTMENT AT [facility] Provider Note   CSN: [id number] Arrival date & time: 05/14/23  1210     History  No chief complaint on file.   Kelli Hudson is a 33 y.o. female with past medical history significant for hypertension, diabetes, gastroparesis, bilateral AKA who presents concern for elevated blood pressure today at physical therapy.  Patient denies any chest pain, vision changes, headache, nausea, vomiting, or other concerns.  Reports that she does not want to be in the emergency department and having to be discharged after blood pressure recheck but she needed in the hospital.  HPI     Home Medications Prior to Admission medications   Medication Sig Start Date End Date Taking? Authorizing Provider  acetaminophen (TYLENOL) 500 MG tablet Take 1 tablet (500 mg total) by mouth every 6 (six) hours as needed. 04/04/23   Fayrene Helper, PA-C  amLODipine (NORVASC) 10 MG tablet Take 1 tablet (10 mg total) by mouth daily. 12/04/22   Elgergawy, Leana Roe, MD  ascorbic acid (VITAMIN C) 1000 MG tablet Take 1 tablet (1,000 mg total) by mouth daily. Patient taking differently: Take 1,000 mg by mouth once a week. 11/22/22   Azucena Fallen, MD  benzonatate (TESSALON) 100 MG capsule Take 1 capsule (100 mg total) by mouth every 8 (eight) hours. 04/04/23   Fayrene Helper, PA-C  carvedilol (COREG) 6.25 MG tablet Take 1 tablet (6.25 mg total) by mouth 2 (two) times daily with a meal. 12/03/22   Elgergawy, Leana Roe, MD  Continuous Glucose Transmitter (DEXCOM G6 TRANSMITTER) MISC 1 Device by Does not apply route as directed. 06/18/22   Shamleffer, Konrad Dolores, MD  etonogestrel (NEXPLANON) 68 MG IMPL implant 1 each by Subdermal route continuous.    [provider]  famotidine (PEPCID) 20 MG tablet Take 1 tablet (20 mg total) by mouth 2 (two) times daily. 12/03/22   Elgergawy, Leana Roe, MD  gabapentin (NEURONTIN) 300 MG capsule Take 1 capsule (300 mg  total) by mouth every 8 (eight) hours. 11/22/22   Azucena Fallen, MD  hydrOXYzine (ATARAX) 10 MG tablet Take 1 tablet (10 mg total) by mouth 3 (three) times daily as needed for anxiety. 03/07/23   Osvaldo Shipper, MD  insulin aspart (NOVOLOG) 100 UNIT/ML injection Inject 0-9 Units into the skin 3 (three) times daily with meals. Patient taking differently: Inject 10-30 Units into the skin 3 (three) times daily with meals. 12/03/22   Elgergawy, Leana Roe, MD  insulin degludec (TRESIBA FLEXTOUCH) 200 UNIT/ML FlexTouch Pen Inject 15 Units into the skin 2 (two) times daily. If CBG is > 150    [provider]  Insulin Pen Needle 32G X 4 MM MISC 1 Device by Does not apply route in the morning, at noon, in the evening, and at bedtime. 06/18/22   Shamleffer, Konrad Dolores, MD  MAGNESIUM PO Take 1 tablet by mouth daily.    [provider]  methocarbamol (ROBAXIN) 500 MG tablet Take 500 mg by mouth 3 (three) times daily as needed for muscle spasms. 11/22/22   [provider]  metoCLOPramide (REGLAN) 10 MG tablet Take 1 tablet (10 mg total) by mouth every 6 (six) hours. 03/23/23   Arletha Pili, DO  Multiple Vitamin (MULTIVITAMIN WITH MINERALS) TABS tablet Take 1 tablet by mouth daily. 11/22/22   Azucena Fallen, MD  oxyCODONE (OXY IR/ROXICODONE) 5 MG immediate release tablet Take 1 tablet (5 mg total) by mouth every 6 (six) hours as  needed for severe pain (pain score 7-10). Patient not taking: Reported on 04/05/2023 03/07/23   Osvaldo Shipper, MD  potassium chloride SA (KLOR-CON M) 20 MEQ tablet Take 20 mEq by mouth daily.    [provider]  spironolactone (ALDACTONE) 25 MG tablet Take 25 mg by mouth 2 (two) times daily.    [provider]  traZODone (DESYREL) 150 MG tablet Take 150 mg by mouth at bedtime. 01/14/23   [provider]      Allergies    Ibuprofen and Lisinopril    Review of Systems   Review of Systems  All other systems reviewed  and are negative.   Physical Exam Updated Vital Signs BP (!) 166/110 (BP Location: Left Arm)   Pulse 93   Temp 98.6 F (37 C)   Resp 16   SpO2 98%  Physical Exam Vitals and nursing note reviewed.  Constitutional:      General: She is not in acute distress.    Appearance: Normal appearance.  HENT:     Head: Normocephalic and atraumatic.  Eyes:     General:        Right eye: No discharge.        Left eye: No discharge.  Cardiovascular:     Rate and Rhythm: Normal rate and regular rhythm.  Pulmonary:     Effort: Pulmonary effort is normal. No respiratory distress.  Musculoskeletal:        General: No deformity.     Comments: Bilateral AKA  Skin:    General: Skin is warm and dry.  Neurological:     Mental Status: She is alert and oriented to person, place, and time.  Psychiatric:        Mood and Affect: Mood normal.        Behavior: Behavior normal.     ED Results / Procedures / Treatments   Labs (all labs ordered are listed, but only abnormal results are displayed) Labs Reviewed - No data to display  EKG None  Radiology No results found.  Procedures Procedures    Medications Ordered in ED Medications  amLODipine (NORVASC) tablet 10 mg (has no administration in time range)    ED Course/ Medical Decision Making/ A&P                                 Medical Decision Making  Medical Decision Making:   Kelli Hudson is a 33 y.o. female who presented to the ED today with asymptomatic htn detailed above.    External chart has been reviewed including previous labwork, imaging from ED visits. Patient's presentation is complicated by their history of hypertension, diabetes, gastroparesis, bilateral AKA.  Complete initial physical exam performed, notably the patient  was without tachycardia, normal Respiratory rate, blood pressure is 166/110 Reviewed and confirmed nursing documentation for past medical history, family history, social history.     Initial Assessment:   With the patient's presentation of elevated blood pressure readings, most likely diagnosis is hypertensive urgency. Other diagnoses associated with hypertensive emergency were considered including (but not limited to) intracranial hemorrhage, acute renal artery stenosis, acute kidney injury, myocardial stress, ophthalmologic emergencies. These are considered less likely due to history of present illness and physical exam findings.   This is most consistent with an acute life/limb threatening illness complicated by underlying chronic conditions. Will evaluate for hypertensive emergency as below.  Initial Plan:  Declines any screening lab  work, given that she is totally asymptomatic think this is a reasonable plan, she has a known history of hypertension, discussed close follow-up with PCP necessary for asymptomatic hypertension alone Objective evaluation as below reviewed. Considered further administration of antihypertensives in ED, per consensus guidelines for [facility] of emergency physicians, acute treatment of hypertensive urgency alone in the emergency department is not recommended.  If patient has evidence of hypertensive emergency on objective laboratory evaluation, will reevaluate.  Will monitor blood pressure while patient awaiting above laboratory studies. Reassessment and Plan:   Systolic blood pressure is 166, diastolic is quite elevated at 914, but again as she is asymptomatic I wouldnt recommend any acute treatment. Will give one time dose of patient's home amlodipine.   Final Clinical Impression(s) / ED Diagnoses Final diagnoses:  None    Rx / DC Orders ED Discharge Orders     None         Olene Floss, PA-C 05/14/23 1321    Coral Spikes, DO 05/14/23 1650

## 2023-05-14 NOTE — ED Triage Notes (Signed)
 Pt. Stated, I was at PT and they sent me here for recheck of BP.

## 2023-05-14 NOTE — Therapy (Signed)
 OUTPATIENT OCCUPATIONAL THERAPY NEURO Arrived, No Charge  Patient Name: Kelli Hudson MRN: [id number] DOB:[birth year], 33 y.o., female Today's Date: 05/14/2023  PCP: Cristino Martes, NP  REFERRING PROVIDER: Nadara Mustard, MD  END OF SESSION:  OT End of Session - 05/14/23 1205     Visit Number 0    Number of Visits 17   including eval   Date for OT Re-Evaluation 06/26/23    Authorization Type [employer] Medicare Dual/Medicaid, covered 100%    Progress Note Due on Visit 10    OT Start Time 1147    OT Stop Time 1156    OT Time Calculation (min) 9 min    Activity Tolerance Treatment limited secondary to medical complications (Comment)   elevated BP   Behavior During Therapy Anxious              Past Medical History:  Diagnosis Date   Acute H. pylori gastric ulcer    Coffee ground emesis    Diabetes mellitus (HCC)    Diabetic gastroparesis (HCC)    DKA (diabetic ketoacidosis) (HCC) 02/24/2021   Gastroparesis    GERD (gastroesophageal reflux disease)    Hypertension    Hyperthyroidism    Intractable nausea and vomiting 04/20/2021   Normocytic anemia 04/20/2020   Prolonged Q-T interval on ECG    Past Surgical History:  Procedure Laterality Date   AMPUTATION Bilateral 10/24/2022   Procedure: BILATERAL BELOW KNEE AMPUTATION;  Surgeon: Nadara Mustard, MD;  Location: [facility] OR;  Service: Orthopedics;  Laterality: Bilateral;   AMPUTATION Bilateral 10/08/2022   Procedure: BILATERAL LEG DEBRIDEMENT;  Surgeon: Nadara Mustard, MD;  Location: [facility] OR;  Service: Orthopedics;  Laterality: Bilateral;   AMPUTATION Bilateral 11/14/2022   Procedure: BILATERAL ABOVE KNEE AMPUTATION;  Surgeon: Nadara Mustard, MD;  Location: [facility] OR;  Service: Orthopedics;  Laterality: Bilateral;   AMPUTATION TOE Left 03/10/2018   Procedure: AMPUTATION FIFTH TOE;  Surgeon: Vivi Barrack, DPM;  Location: [facility];  Service: Podiatry;  Laterality: Left;   APPLICATION OF WOUND VAC  Bilateral 10/12/2022   Procedure: APPLICATION OF WOUND VAC BILATERAL LEGS;  Surgeon: Myrene Galas, MD;  Location: [facility] OR;  Service: Orthopedics;  Laterality: Bilateral;   APPLICATION OF WOUND VAC Bilateral 10/24/2022   Procedure: APPLICATION OF WOUND VAC;  Surgeon: Nadara Mustard, MD;  Location: [facility] OR;  Service: Orthopedics;  Laterality: Bilateral;   BIOPSY  01/28/2020   Procedure: BIOPSY;  Surgeon: Kathi Der, MD;  Location: [facility];  Service: Gastroenterology;;   BOTOX INJECTION  08/26/2021   Procedure: BOTOX INJECTION;  Surgeon: Sherrilyn Rist, MD;  Location: [facility];  Service: Gastroenterology;;   ESOPHAGOGASTRODUODENOSCOPY N/A 01/28/2020   Procedure: ESOPHAGOGASTRODUODENOSCOPY (EGD);  Surgeon: Kathi Der, MD;  Location: [facility];  Service: Gastroenterology;  Laterality: N/A;   ESOPHAGOGASTRODUODENOSCOPY (EGD) WITH PROPOFOL Left 09/08/2015   Procedure: ESOPHAGOGASTRODUODENOSCOPY (EGD) WITH PROPOFOL;  Surgeon: Willis Modena, MD;  Location: [facility];  Service: Endoscopy;  Laterality: Left;   ESOPHAGOGASTRODUODENOSCOPY (EGD) WITH PROPOFOL N/A 08/26/2021   Procedure: ESOPHAGOGASTRODUODENOSCOPY (EGD) WITH PROPOFOL;  Surgeon: Sherrilyn Rist, MD;  Location: [facility];  Service: Gastroenterology;  Laterality: N/A;   GASTRIC STIMULATOR IMPLANT SURGERY  06/2022   at [facility]   I & D EXTREMITY Bilateral 10/12/2022   Procedure: IRRIGATION AND  DEBRIDEMENT  BILATERAL LEGS;  Surgeon: Myrene Galas, MD;  Location: [facility] OR;  Service: Orthopedics;  Laterality: Bilateral;   I & D EXTREMITY Right 10/13/2022  Procedure: RIGHT THIGH WOUND VAC EXCHANGE;  Surgeon: Roby Lofts, MD;  Location: [facility] OR;  Service: Orthopedics;  Laterality: Right;   I & D EXTREMITY Bilateral 10/17/2022   Procedure: BILATERAL THIGH AND LEG DEBRIDEMENT, PARTIAL BILATERAL FOOT AMPUTATION;  Surgeon: Nadara Mustard, MD;  Location: [facility] OR;  Service: Orthopedics;  Laterality: Bilateral;   I & D EXTREMITY  Bilateral 11/05/2022   Procedure: BILATERAL THIGH DEBRIDEMENT;  Surgeon: Nadara Mustard, MD;  Location: [facility] OR;  Service: Orthopedics;  Laterality: Bilateral;   I & D EXTREMITY Bilateral 11/07/2022   Procedure: BILATERAL THIGH AND LEG DEBRIDEMENT;  Surgeon: Nadara Mustard, MD;  Location: [facility] OR;  Service: Orthopedics;  Laterality: Bilateral;   I & D EXTREMITY Bilateral 11/12/2022   Procedure: BILATERAL LEG DEBRIDEMENTS;  Surgeon: Nadara Mustard, MD;  Location: [facility] OR;  Service: Orthopedics;  Laterality: Bilateral;   IRRIGATION AND DEBRIDEMENT FOOT Bilateral 10/05/2022   Procedure: IRRIGATION AND DEBRIDEMENT FOOT;  Surgeon: [name], DPM;  Location: [facility];  Service: Orthopedics/Podiatry;  Laterality: Bilateral;   WISDOM TOOTH EXTRACTION     Patient Active Problem List   Diagnosis Date Noted   Influenza A 04/05/2023   Elevated lipase 04/05/2023   Esophagitis 03/24/2023   SIRS (systemic inflammatory response syndrome) (HCC) 03/02/2023   Fever 03/02/2023   Metabolic acidosis 03/02/2023   Hypertensive urgency 02/21/2023   Chronic pain 02/21/2023   Abscess of thigh 11/28/2022   C. 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   Hypokalemia 11/27/2022   Effusion, left knee 11/05/2022   MDD (major depressive disorder), recurrent episode, moderate (HCC) 10/16/2022   Necrotizing fasciitis of pelvic region and thigh (HCC) 10/08/2022   Necrotizing fasciitis of lower leg (HCC) 10/08/2022   Necrotizing fasciitis (HCC) 10/08/2022   Sepsis due to cellulitis (HCC) 10/03/2022   Cellulitis of lower extremity 10/03/2022   Multinodular goiter 06/18/2022   Malnutrition of moderate degree 04/18/2022   Intractable vomiting with nausea 04/17/2022   DKA (diabetic ketoacidosis) (HCC) 03/04/2022   Nausea vomiting and diarrhea 12/25/2021   Diabetic gastroparesis (HCC) 10/09/2021   Drug-seeking behavior 09/21/2021   Essential hypertension 08/25/2021   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) 08/25/2021   GERD without esophagitis 08/25/2021   Hematemesis    Prolonged Q-T interval on ECG    Nausea and vomiting 07/20/2021   Anemia of chronic disease 06/06/2021   Dehydration 06/04/2021   Hypomagnesemia 04/21/2021   Ulcerated, foot, left, with fat layer exposed (HCC) 04/20/2021   Uncontrolled type 1 diabetes mellitus with hyperglycemia, with long-term current use of insulin (HCC) 12/18/2020   DM type 1 (diabetes mellitus, type 1) (HCC) 12/18/2020   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) 11/09/2020   Diabetic retinopathy of both eyes associated with type 1 diabetes mellitus (HCC) 09/24/2020   Diabetic gastroparesis associated with type 1 diabetes mellitus (HCC) 08/12/2020   Acute kidney injury superimposed on chronic kidney disease (HCC) 07/25/2020   Generalized abdominal pain 07/23/2020   GERD (gastroesophageal reflux disease) 07/23/2020   Diabetic nephropathy associated with type 1 diabetes mellitus (HCC) 06/27/2020   Sepsis (HCC) 05/27/2020   HLD (hyperlipidemia) 05/23/2020   Sinus tachycardia 05/09/2020   Anemia 04/20/2020   Depression with anxiety 07/05/2019   Diabetic polyneuropathy associated with type 1 diabetes mellitus (HCC) 09/24/2017   Gastroparesis    Goiter 10/05/2009   DM2 (diabetes mellitus, type 2) (HCC) 10/05/2009   Hypertension  associated with diabetes (HCC) 10/05/2009    ONSET DATE: 04/16/23 (referral date)  REFERRING DIAG: [phone] (ICD-10-CM) - S/P AKA (above knee amputation) bilateral   THERAPY DIAG:  Muscle weakness (generalized)  Other symptoms and signs involving the musculoskeletal system  Abnormal posture  Rationale for Evaluation and Treatment: Rehabilitation  SUBJECTIVE:   SUBJECTIVE STATEMENT: Pt reported R eyelid swelling has been going away gradually. Pt reported no updates, previously missed appointments d/t schedule conflict and pt was sick. Pt not seen by  OT since 04/16/23.  Pt reported feeling nauseous today. Pt reported eating steak biscuit and hash browns this morning. Pt reported sx have occurred before. Vital signs assessed, see vital signs. Pt reported taking BP medication this morning.  Pt accompanied by: self,  friend Kenard Gower)  PERTINENT HISTORY: DM, diabetic gastroparesis, GERD, HTN, hyperthyroidism   PRECAUTIONS: Fall; B diabetic retinopathy   WEIGHT BEARING RESTRICTIONS: No  PAIN:  Are you having pain? No  FALLS: Has patient fallen in last 6 months? Yes. Number of falls 1 - around Thanksgiving when attempting to transfer to a second w/c  LIVING ENVIRONMENT:  Lives with: lives alone Lives in: House/apartment Stairs: No Has following equipment at home: Wheelchair (manual) and shower chair; aide 7 days/week for 4 hours  PLOF: Independent with basic ADLs, community mobility: assistance from friends  PATIENT GOALS: "I want to work on standing and balance"  OBJECTIVE:  Note: Objective measures were completed at Evaluation unless otherwise noted.  HAND DOMINANCE: Right  ADLs: Overall ADLs: ind, cautious Eating: ind Grooming: ind UB Dressing: ind, some difficulty with donning standard bras though no difficulty with donning sports bras LB Dressing: ind Toileting: ind Bathing: prefers at least direct or distant supervision though has completed ind - "I'm scared because shower chair moves a little bit because it's a little bigger than the tub so I feel better when someone is there." Tub Shower transfers: tub transfer bench Equipment: tub transfer bench, tub/shower  IADLs: Meal Prep: difficulty cutting food for meal prep/cooking when attempting to stand. Pt reported typically using microwave meals and sometimes using stove/oven. Pt reported trying to not be afraid of doing "normal stuff" again.  Handwriting:  Pt reported no need for handwriting, decreased vision.  Pt reported using voice text to communicate using  phone.  MOBILITY STATUS:  w/c, reported standing in prosthetics only when coming to therapy though will attempt to stand at home with another person to supervise  soon  POSTURE COMMENTS:  rounded shoulders and forward head  ACTIVITY TOLERANCE: Activity tolerance: about the same as PLOF, more tired after standing tasks though only for "just 10 minutes"  FUNCTIONAL OUTCOME MEASURES: PSFS: 6.7   Total score = sum of the activity scores/number of activities Minimum detectable change (90%CI) for average score = 2 points Minimum detectable change (90%CI) for single activity score = 3 points    UPPER EXTREMITY ROM:    Active ROM Right eval Left eval  Shoulder flexion [facility]  Shoulder abduction    Shoulder adduction    Shoulder extension    Shoulder internal rotation    Shoulder external rotation    Elbow flexion    Elbow extension    Wrist flexion    Wrist extension    Wrist ulnar deviation    Wrist radial deviation    Wrist pronation    Wrist supination    (Blank rows = not tested)  UPPER EXTREMITY MMT:     MMT Right eval Left eval  Shoulder flexion    Shoulder abduction    Shoulder adduction    Shoulder extension    Shoulder internal rotation    Shoulder external rotation    Middle trapezius    Lower trapezius    Elbow flexion    Elbow extension    Wrist flexion    Wrist extension    Wrist ulnar deviation    Wrist radial deviation    Wrist pronation    Wrist supination    (Blank rows = not tested)  HAND FUNCTION: Grip strength: Right: 57 lbs; Left: 39 lbs  Pt reported some difficulty with tasks requiring strength of L side. Pt reported noticing weakness of L side compared to R side.  COORDINATION: 9 Hole Peg test: Right: 37 sec; Left: 33 sec, decreased speed secondary to visual deficits  SENSATION: Pt denied numbness/tingling/discomfort of BLE and BUE.  EDEMA: R eyelid swollen.  MUSCLE TONE: RUE: Within functional limits and LUE: Within  functional limits  COGNITION: Overall cognitive status: Within functional limits for tasks assessed  VISION: Subjective report: Corrective lens no longer help, low vision per pt, better seeing close up. Pt reported some peripheral vision L side, less peripheral vision R side. Baseline vision: B diabetic retinopathy: "it's low."  Visual history:  B diabetic retinopathy  VISION ASSESSMENT: Visual tracking: Difficulty tracking black pen. Improved ability to track brightly colored marker to all 4 quadrants.  PERCEPTION: Not tested  PRAXIS: Not tested  OBSERVATIONS: Pt in w/c without prosthetics though with BLE prosthetics available nearby. Pt pleasant with decreased vision secondary to B retinopathy. Pt's friend assisted pt with w/c mobility today.                                                                                                                            TREATMENT DATE:    Self-Care  OT reviewed goals and pt agreeable to goals.   OT educated pt on support group option for individuals with amputations. Pt agreeable. However, pt's contact information not obtained d/t pt reporting feeling nauseous. OT noted pt appeared unwell.   Vitals assessed, see vitals signs. Vital signs noted to be very elevated. Pt became tearful and expressed concerns about missing visits. OT provided verbal reassurance to pt and educated on BP parameters. OT recommended to pt and friend to immediately proceed to ED or OT can call 9-1-1. Pt and friend stated preference to drive to ED. OT provided vital signs information, provided location of ED, and supervised transfer to car. Pt safely exited clinic and safely completed car transfer.   PATIENT EDUCATION: Education details: see today's tx above Person educated: Patient and friend Education method: Explanation Education comprehension: verbalized understanding  HOME EXERCISE PROGRAM: TBD   GOALS: Goals reviewed with patient? Yes  SHORT TERM  GOALS: Target date: 05/29/23  Pt will recall or demo at least 2 visual compensation strategies Baseline: impaired vision secondary to diabetic retinopathy Goal status: INITIAL  2.  Pt  will verbalize or demo understanding of safe simulated shower transfers modI with A/E and adaptive strategies PRN. Baseline: Pt ind though fearful and prefers supervision during showers and shower transfers d/t unstable shower chair/bench. Pt has tub transfer bench and transfer board available.  Goal status: INITIAL  3.  Pt will ind demo understanding of LUE HEP. Baseline: New to outpt OT Goal status: INITIAL  4. Pt will verbalize understanding of community resources for individuals with amputations.  Baseline: new to outpt OT  Goal status: INITIAL  LONG TERM GOALS: Target date: 06/26/23  Pt will ind demo understanding of updated HEP. Baseline: new to outpt OT Goal status: INITIAL  2.  Patient will report at least two-point increase in average PSFS score or at least three-point increase in a single activity score indicating functionally significant improvement given minimum detectable change using BLE prosthetics PRN. Baseline: 6.7 total score (See above for individual activity scores) without use of BLE prosthetics.  Goal status: INITIAL  3.  Pt will demo at least 45 lbs LUE grip strength as needed to open jars/containers. Baseline: Grip strength: Right: 57 lbs; Left: 39 lbs Goal status: INITIAL  4.  Pt will complete simple meal prep tasks modI using BLE prosthetics as able. Baseline: pt beginning to trial use of BLE prosthetics. Pt reported difficulty with meal prep tasks currently. Goal status: INITIAL  ASSESSMENT:  CLINICAL IMPRESSION: Pt's participation limited secondary to very elevated BP and pt symptomatic. OT advised pt and friend to immediately proceed to ED. Pt safely exited clinic and completed car transfer.  PERFORMANCE DEFICITS: in functional skills including ADLs, IADLs,  coordination, dexterity, proprioception, edema, strength, flexibility, Fine motor control, Gross motor control, mobility, balance, body mechanics, endurance, decreased knowledge of use of DME, vision, and UE functional use, cognitive skills including energy/drive, and psychosocial skills including environmental adaptation.   IMPAIRMENTS: are limiting patient from ADLs, IADLs, rest and sleep, work, leisure, and social participation.   CO-MORBIDITIES: may have co-morbidities  that affects occupational performance. Patient will benefit from skilled OT to address above impairments and improve overall function.  MODIFICATION OR ASSISTANCE TO COMPLETE EVALUATION: Min-Moderate modification of tasks or assist with assess necessary to complete an evaluation.  OT OCCUPATIONAL PROFILE AND HISTORY: Detailed assessment: Review of records and additional review of physical, cognitive, psychosocial history related to current functional performance.  CLINICAL DECISION MAKING: Moderate - several treatment options, min-mod task modification necessary  REHAB POTENTIAL: Good  EVALUATION COMPLEXITY: Moderate    PLAN:  OT FREQUENCY: 2x/week  OT DURATION: 8 weeks  PLANNED INTERVENTIONS: 97168 OT Re-evaluation, 97535 self care/ADL training, 40981 therapeutic exercise, 97530 therapeutic activity, 97112 neuromuscular re-education, 97140 manual therapy, 97035 ultrasound, 97018 paraffin, 19147 fluidotherapy, 97010 moist heat, 97010 cryotherapy, 97034 contrast bath, 97760 Orthotics management and training, 82956 Splinting (initial encounter), [id number] Subsequent splinting/medication, passive range of motion, functional mobility training, visual/perceptual remediation/compensation, energy conservation, patient/family education, and DME and/or AE instructions  RECOMMENDED OTHER SERVICES: PT eval already completed  CONSULTED AND AGREED WITH PLAN OF CARE: Patient  PLAN FOR NEXT SESSION:  Monitor per medical event on  05/14/23  LUE theraputty - pink or green (?mix) Community resources - support group, ?w/c accessible dance classes Vision compensation strategies Practice standing with BLE prosthetics - incorporate functional tasks as able   Wynetta Emery, OT 05/14/2023, 12:16 PM

## 2023-05-14 NOTE — Therapy (Signed)
 [facility] [address] [place], [place], [address] Phone: [phone]   Fax:  [phone]  Patient Details  Name: Kelli Hudson MRN: [id number] Date of Birth: 02-Mar-1990 Referring Provider:  Cristino Martes, NP  Encounter Date: 05/14/2023  Session was arrive no charge. Patient arrived for OT sessions with reports of nausea and elevated BP readings when OT assessed. OT informs PT sending patient to ED given symptoms.   Carmelia Bake, PT, DPT 05/14/2023, 12:36 PM  [facility] [address] [place], [place], [address] Phone: [phone]   Fax:  [phone]

## 2023-05-18 ENCOUNTER — Ambulatory Visit: Admitting: Occupational Therapy

## 2023-05-18 ENCOUNTER — Encounter: Payer: Self-pay | Admitting: Physical Therapy

## 2023-05-18 ENCOUNTER — Ambulatory Visit: Payer: 59 | Admitting: Physical Therapy

## 2023-05-18 VITALS — BP 136/81

## 2023-05-18 DIAGNOSIS — M6281 Muscle weakness (generalized): Secondary | ICD-10-CM

## 2023-05-18 DIAGNOSIS — R2689 Other abnormalities of gait and mobility: Secondary | ICD-10-CM

## 2023-05-18 DIAGNOSIS — R29898 Other symptoms and signs involving the musculoskeletal system: Secondary | ICD-10-CM

## 2023-05-18 DIAGNOSIS — R293 Abnormal posture: Secondary | ICD-10-CM | POA: Diagnosis not present

## 2023-05-18 NOTE — Therapy (Signed)
 OUTPATIENT OCCUPATIONAL THERAPY NEURO   Patient Name: Kelli Hudson MRN: [id number] DOB:February 11, 1991, 33 y.o., female Today's Date: 05/18/2023  PCP: Cristino Martes, NP  REFERRING PROVIDER: Nadara Mustard, MD  END OF SESSION:  OT End of Session - 05/18/23 1240     Visit Number 2    Number of Visits 17   including eval   Date for OT Re-Evaluation 06/26/23    Authorization Type [employer] Medicare Dual/Medicaid, covered 100%    Progress Note Due on Visit 10    OT Start Time 1237    OT Stop Time 1315    OT Time Calculation (min) 38 min    Activity Tolerance Patient tolerated treatment well    Behavior During Therapy WFL for tasks assessed/performed            Past Medical History:  Diagnosis Date   Acute H. pylori gastric ulcer    Coffee ground emesis    Diabetes mellitus (HCC)    Diabetic gastroparesis (HCC)    DKA (diabetic ketoacidosis) (HCC) 02/24/2021   Gastroparesis    GERD (gastroesophageal reflux disease)    Hypertension    Hyperthyroidism    Intractable nausea and vomiting 04/20/2021   Normocytic anemia 04/20/2020   Prolonged Q-T interval on ECG    Past Surgical History:  Procedure Laterality Date   AMPUTATION Bilateral 10/24/2022   Procedure: BILATERAL BELOW KNEE AMPUTATION;  Surgeon: Nadara Mustard, MD;  Location: [facility] OR;  Service: Orthopedics;  Laterality: Bilateral;   AMPUTATION Bilateral 10/08/2022   Procedure: BILATERAL LEG DEBRIDEMENT;  Surgeon: Nadara Mustard, MD;  Location: [facility] OR;  Service: Orthopedics;  Laterality: Bilateral;   AMPUTATION Bilateral 11/14/2022   Procedure: BILATERAL ABOVE KNEE AMPUTATION;  Surgeon: Nadara Mustard, MD;  Location: [facility] OR;  Service: Orthopedics;  Laterality: Bilateral;   AMPUTATION TOE Left 03/10/2018   Procedure: AMPUTATION FIFTH TOE;  Surgeon: Vivi Barrack, DPM;  Location: [facility];  Service: Podiatry;  Laterality: Left;   APPLICATION OF WOUND VAC Bilateral 10/12/2022   Procedure: APPLICATION  OF WOUND VAC BILATERAL LEGS;  Surgeon: Myrene Galas, MD;  Location: [facility] OR;  Service: Orthopedics;  Laterality: Bilateral;   APPLICATION OF WOUND VAC Bilateral 10/24/2022   Procedure: APPLICATION OF WOUND VAC;  Surgeon: Nadara Mustard, MD;  Location: [facility] OR;  Service: Orthopedics;  Laterality: Bilateral;   BIOPSY  01/28/2020   Procedure: BIOPSY;  Surgeon: Kathi Der, MD;  Location: [facility];  Service: Gastroenterology;;   BOTOX INJECTION  08/26/2021   Procedure: BOTOX INJECTION;  Surgeon: Sherrilyn Rist, MD;  Location: [facility];  Service: Gastroenterology;;   ESOPHAGOGASTRODUODENOSCOPY N/A 01/28/2020   Procedure: ESOPHAGOGASTRODUODENOSCOPY (EGD);  Surgeon: Kathi Der, MD;  Location: [facility];  Service: Gastroenterology;  Laterality: N/A;   ESOPHAGOGASTRODUODENOSCOPY (EGD) WITH PROPOFOL Left 09/08/2015   Procedure: ESOPHAGOGASTRODUODENOSCOPY (EGD) WITH PROPOFOL;  Surgeon: Willis Modena, MD;  Location: [facility];  Service: Endoscopy;  Laterality: Left;   ESOPHAGOGASTRODUODENOSCOPY (EGD) WITH PROPOFOL N/A 08/26/2021   Procedure: ESOPHAGOGASTRODUODENOSCOPY (EGD) WITH PROPOFOL;  Surgeon: Sherrilyn Rist, MD;  Location: [facility];  Service: Gastroenterology;  Laterality: N/A;   GASTRIC STIMULATOR IMPLANT SURGERY  06/2022   at [facility]   I & D EXTREMITY Bilateral 10/12/2022   Procedure: IRRIGATION AND  DEBRIDEMENT  BILATERAL LEGS;  Surgeon: Myrene Galas, MD;  Location: [facility] OR;  Service: Orthopedics;  Laterality: Bilateral;   I & D EXTREMITY Right 10/13/2022   Procedure: RIGHT THIGH WOUND VAC EXCHANGE;  Surgeon: Roby Lofts, MD;  Location: [facility] OR;  Service: Orthopedics;  Laterality: Right;   I & D EXTREMITY Bilateral 10/17/2022   Procedure: BILATERAL THIGH AND LEG DEBRIDEMENT, PARTIAL BILATERAL FOOT AMPUTATION;  Surgeon: Nadara Mustard, MD;  Location: [facility] OR;  Service: Orthopedics;  Laterality: Bilateral;   I & D EXTREMITY Bilateral 11/05/2022   Procedure: BILATERAL  THIGH DEBRIDEMENT;  Surgeon: Nadara Mustard, MD;  Location: [facility] OR;  Service: Orthopedics;  Laterality: Bilateral;   I & D EXTREMITY Bilateral 11/07/2022   Procedure: BILATERAL THIGH AND LEG DEBRIDEMENT;  Surgeon: Nadara Mustard, MD;  Location: [facility] OR;  Service: Orthopedics;  Laterality: Bilateral;   I & D EXTREMITY Bilateral 11/12/2022   Procedure: BILATERAL LEG DEBRIDEMENTS;  Surgeon: Nadara Mustard, MD;  Location: [facility] OR;  Service: Orthopedics;  Laterality: Bilateral;   IRRIGATION AND DEBRIDEMENT FOOT Bilateral 10/05/2022   Procedure: IRRIGATION AND DEBRIDEMENT FOOT;  Surgeon: [name], DPM;  Location: [facility];  Service: Orthopedics/Podiatry;  Laterality: Bilateral;   WISDOM TOOTH EXTRACTION     Patient Active Problem List   Diagnosis Date Noted   Influenza A 04/05/2023   Elevated lipase 04/05/2023   Esophagitis 03/24/2023   SIRS (systemic inflammatory response syndrome) (HCC) 03/02/2023   Fever 03/02/2023   Metabolic acidosis 03/02/2023   Hypertensive urgency 02/21/2023   Chronic pain 02/21/2023   Abscess of thigh 11/28/2022   C. 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   Hypokalemia 11/27/2022   Effusion, left knee 11/05/2022   MDD (major depressive disorder), recurrent episode, moderate (HCC) 10/16/2022   Necrotizing fasciitis of pelvic region and thigh (HCC) 10/08/2022   Necrotizing fasciitis of lower leg (HCC) 10/08/2022   Necrotizing fasciitis (HCC) 10/08/2022   Sepsis due to cellulitis (HCC) 10/03/2022   Cellulitis of lower extremity 10/03/2022   Multinodular goiter 06/18/2022   Malnutrition of moderate degree 04/18/2022   Intractable vomiting with nausea 04/17/2022   DKA (diabetic ketoacidosis) (HCC) 03/04/2022   Nausea vomiting and diarrhea 12/25/2021   Diabetic gastroparesis (HCC) 10/09/2021   Drug-seeking behavior 09/21/2021   Essential hypertension 08/25/2021   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)  08/25/2021   GERD without esophagitis 08/25/2021   Hematemesis    Prolonged Q-T interval on ECG    Nausea and vomiting 07/20/2021   Anemia of chronic disease 06/06/2021   Dehydration 06/04/2021   Hypomagnesemia 04/21/2021   Ulcerated, foot, left, with fat layer exposed (HCC) 04/20/2021   Uncontrolled type 1 diabetes mellitus with hyperglycemia, with long-term current use of insulin (HCC) 12/18/2020   DM type 1 (diabetes mellitus, type 1) (HCC) 12/18/2020   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) 11/09/2020   Diabetic retinopathy of both eyes associated with type 1 diabetes mellitus (HCC) 09/24/2020   Diabetic gastroparesis associated with type 1 diabetes mellitus (HCC) 08/12/2020   Acute kidney injury superimposed on chronic kidney disease (HCC) 07/25/2020   Generalized abdominal pain 07/23/2020   GERD (gastroesophageal reflux disease) 07/23/2020   Diabetic nephropathy associated with type 1 diabetes mellitus (HCC) 06/27/2020   Sepsis (HCC) 05/27/2020   HLD (hyperlipidemia) 05/23/2020   Sinus tachycardia 05/09/2020   Anemia 04/20/2020   Depression with anxiety 07/05/2019   Diabetic polyneuropathy associated with type 1 diabetes mellitus (HCC) 09/24/2017   Gastroparesis    Goiter 10/05/2009   DM2 (diabetes mellitus, type 2) (HCC) 10/05/2009   Hypertension associated with diabetes (HCC) 10/05/2009  ONSET DATE: 04/16/23 (referral date)  REFERRING DIAG: [phone] (ICD-10-CM) - S/P AKA (above knee amputation) bilateral   THERAPY DIAG:  Muscle weakness (generalized)  Other symptoms and signs involving the musculoskeletal system  Rationale for Evaluation and Treatment: Rehabilitation  SUBJECTIVE:   SUBJECTIVE STATEMENT:  Pt reports taking medication not at the same time each day. This is complicated by her ability to get quality sleep.   She has scheduled a visit with her PCP.   She does not have a home BP monitor.   Pt reports 30 pt font  easiest to read. She uses the magnifier app on her iPhone to help read smaller items.   Pt accompanied by: self,  friend Kenard Gower)  PERTINENT HISTORY: DM, diabetic gastroparesis, GERD, HTN, hyperthyroidism   PRECAUTIONS: Fall; B diabetic retinopathy   WEIGHT BEARING RESTRICTIONS: No  PAIN:  Are you having pain? No  FALLS: Has patient fallen in last 6 months? Yes. Number of falls 1 - around Thanksgiving when attempting to transfer to a second w/c  LIVING ENVIRONMENT:  Lives with: lives alone Lives in: House/apartment Stairs: No Has following equipment at home: Wheelchair (manual) and shower chair; aide 7 days/week for 4 hours  PLOF: Independent with basic ADLs, community mobility: assistance from friends  PATIENT GOALS: "I want to work on standing and balance"  OBJECTIVE:  Note: Objective measures were completed at Evaluation unless otherwise noted.  Vitals:   05/18/23 1246  BP: 136/81    HAND DOMINANCE: Right  ADLs: Overall ADLs: ind, cautious Eating: ind Grooming: ind UB Dressing: ind, some difficulty with donning standard bras though no difficulty with donning sports bras LB Dressing: ind Toileting: ind Bathing: prefers at least direct or distant supervision though has completed ind - "I'm scared because shower chair moves a little bit because it's a little bigger than the tub so I feel better when someone is there." Tub Shower transfers: tub transfer bench Equipment: tub transfer bench, tub/shower  IADLs: Meal Prep: difficulty cutting food for meal prep/cooking when attempting to stand. Pt reported typically using microwave meals and sometimes using stove/oven. Pt reported trying to not be afraid of doing "normal stuff" again.  Handwriting:  Pt reported no need for handwriting, decreased vision.  Pt reported using voice text to communicate using phone.  MOBILITY STATUS:  w/c, reported standing in prosthetics only when coming to therapy though will attempt to stand  at home with another person to supervise  soon  POSTURE COMMENTS:  rounded shoulders and forward head  ACTIVITY TOLERANCE: Activity tolerance: about the same as PLOF, more tired after standing tasks though only for "just 10 minutes"  FUNCTIONAL OUTCOME MEASURES: PSFS: 6.7   Total score = sum of the activity scores/number of activities Minimum detectable change (90%CI) for average score = 2 points Minimum detectable change (90%CI) for single activity score = 3 points    UPPER EXTREMITY ROM:    Active ROM Right eval Left eval  Shoulder flexion [facility]  Shoulder abduction    Shoulder adduction    Shoulder extension    Shoulder internal rotation    Shoulder external rotation    Elbow flexion    Elbow extension    Wrist flexion    Wrist extension    Wrist ulnar deviation    Wrist radial deviation    Wrist pronation    Wrist supination    (Blank rows = not tested)  UPPER EXTREMITY MMT:     MMT Right eval Left eval  Shoulder flexion    Shoulder abduction    Shoulder adduction    Shoulder extension    Shoulder internal rotation    Shoulder external rotation    Middle trapezius    Lower trapezius    Elbow flexion    Elbow extension    Wrist flexion    Wrist extension    Wrist ulnar deviation    Wrist radial deviation    Wrist pronation    Wrist supination    (Blank rows = not tested)  HAND FUNCTION: Grip strength: Right: 57 lbs; Left: 39 lbs  Pt reported some difficulty with tasks requiring strength of L side. Pt reported noticing weakness of L side compared to R side.  COORDINATION: 9 Hole Peg test: Right: 37 sec; Left: 33 sec, decreased speed secondary to visual deficits  SENSATION: Pt denied numbness/tingling/discomfort of BLE and BUE.  EDEMA: R eyelid swollen.  MUSCLE TONE: RUE: Within functional limits and LUE: Within functional limits  COGNITION: Overall cognitive status: Within functional limits for tasks assessed  VISION: Subjective  report: Corrective lens no longer help, low vision per pt, better seeing close up. Pt reported some peripheral vision L side, less peripheral vision R side. Baseline vision: B diabetic retinopathy: "it's low."  Visual history:  B diabetic retinopathy  VISION ASSESSMENT: Visual tracking: Difficulty tracking black pen. Improved ability to track brightly colored marker to all 4 quadrants.  PERCEPTION: Not tested  PRAXIS: Not tested  OBSERVATIONS: Pt in w/c without prosthetics though with BLE prosthetics available nearby. Pt pleasant with decreased vision secondary to B retinopathy. Pt's friend assisted pt with w/c mobility today.                                                                                                                            TREATMENT DATE:    Self-Care   OT educated pt on taking BP meds at consistent times during the day. Per pt reported routines, recommended she set alarms for 10 AM and 10 PM. This would allow her to take them regardless of the time she ends up going to bed or waking up as this changes frequently.   Per subjective, OT encouraged pt to reach out to insurance to request at home BP monitor. She is scheduled to see PCP and was also encouraged to mention the need of an at home monitor during this visit.   OT educated pt on vision strategies as noted in pt instructions.  - Therapeutic exercises completed for duration as noted below including:  OT initiated LUE red and green (mixed) theraputty exercises (search, grip, and pinch) as noted in patient instructions for coordination and strength   PATIENT EDUCATION: Education details: see today's tx above Person educated: Patient and friend Education method: Programmer, multimedia, Demonstration, Verbal cues, and Handouts Education comprehension: verbalized understanding, returned demonstration, verbal cues required, and needs further education  HOME EXERCISE PROGRAM: 05/18/2023: putty and vision  strategies  GOALS: Goals reviewed with  patient? Yes  SHORT TERM GOALS: Target date: 05/29/23  Pt will recall or demo at least 2 visual compensation strategies Baseline: impaired vision secondary to diabetic retinopathy Goal status: INITIAL  2.  Pt will verbalize or demo understanding of safe simulated shower transfers modI with A/E and adaptive strategies PRN. Baseline: Pt ind though fearful and prefers supervision during showers and shower transfers d/t unstable shower chair/bench. Pt has tub transfer bench and transfer board available.  Goal status: INITIAL  3.  Pt will ind demo understanding of LUE HEP. Baseline: New to outpt OT Goal status: INITIAL  4. Pt will verbalize understanding of community resources for individuals with amputations.  Baseline: new to outpt OT  Goal status: INITIAL  LONG TERM GOALS: Target date: 06/26/23  Pt will ind demo understanding of updated HEP. Baseline: new to outpt OT Goal status: INITIAL  2.  Patient will report at least two-point increase in average PSFS score or at least three-point increase in a single activity score indicating functionally significant improvement given minimum detectable change using BLE prosthetics PRN. Baseline: 6.7 total score (See above for individual activity scores) without use of BLE prosthetics.  Goal status: INITIAL  3.  Pt will demo at least 45 lbs LUE grip strength as needed to open jars/containers. Baseline: Grip strength: Right: 57 lbs; Left: 39 lbs Goal status: INITIAL  4.  Pt will complete simple meal prep tasks modI using BLE prosthetics as able. Baseline: pt beginning to trial use of BLE prosthetics. Pt reported difficulty with meal prep tasks currently. Goal status: INITIAL  ASSESSMENT:  CLINICAL IMPRESSION: Pt demonstrates good understanding of HEP and verbalizes understanding of health management strategies as needed to progress towards goals. Will continue to monitor BP.   PERFORMANCE DEFICITS:  in functional skills including ADLs, IADLs, coordination, dexterity, proprioception, edema, strength, flexibility, Fine motor control, Gross motor control, mobility, balance, body mechanics, endurance, decreased knowledge of use of DME, vision, and UE functional use, cognitive skills including energy/drive, and psychosocial skills including environmental adaptation.   IMPAIRMENTS: are limiting patient from ADLs, IADLs, rest and sleep, work, leisure, and social participation.   CO-MORBIDITIES: may have co-morbidities  that affects occupational performance. Patient will benefit from skilled OT to address above impairments and improve overall function.  REHAB POTENTIAL: Good  PLAN:  OT FREQUENCY: 2x/week  OT DURATION: 8 weeks  PLANNED INTERVENTIONS: 97168 OT Re-evaluation, 97535 self care/ADL training, 16109 therapeutic exercise, 97530 therapeutic activity, 97112 neuromuscular re-education, 97140 manual therapy, 97035 ultrasound, 97018 paraffin, 60454 fluidotherapy, 97010 moist heat, 97010 cryotherapy, 97034 contrast bath, 97760 Orthotics management and training, 09811 Splinting (initial encounter), [id number] Subsequent splinting/medication, passive range of motion, functional mobility training, visual/perceptual remediation/compensation, energy conservation, patient/family education, and DME and/or AE instructions  RECOMMENDED OTHER SERVICES: PT eval already completed  CONSULTED AND AGREED WITH PLAN OF CARE: Patient  PLAN FOR NEXT SESSION:  Monitor per medical event on 05/14/23  Review - LUE theraputty - pink & green (mix) Review vision strategies Community resources - support group, ?w/c accessible dance classes Vision compensation strategies Practice standing with BLE prosthetics - incorporate functional tasks as able   Delana Meyer, OT 05/18/2023, 3:14 PM

## 2023-05-18 NOTE — Patient Instructions (Addendum)
 Vision Strategies  1. Look for the edge of objects (to the left and/or right) so that you make sure you are seeing all of an object  2. Turn your head when walking, scan from side to side, particularly in busy environments  3. Use an organized scanning pattern. It's usually easier to scan from top to bottom, and left to right (like you are reading)  4. Double check yourself  5. Use a line guide (like a blank piece of paper) or your finger when reading  6. If necessary, place brightly colored tape at end of table or work area as a reminder to always look until you see the tape.

## 2023-05-18 NOTE — Therapy (Unsigned)
 OUTPATIENT PHYSICAL THERAPY NEURO TREATMENT   Patient Name: Kelli Hudson MRN: [id number] DOB:1990-04-22, 33 y.o., female Today's Date: 05/19/2023   PCP: Cristino Martes, NP REFERRING PROVIDER: Aldean Baker, MD  END OF SESSION:  PT End of Session - 05/18/23 1322     Visit Number 8    Number of Visits 25    Date for PT Re-Evaluation 07/10/23    Authorization Type Medicare/Medicaid    Progress Note Due on Visit 10    PT Start Time 1315    PT Stop Time 1400    PT Time Calculation (min) 45 min    Equipment Utilized During Treatment Gait belt    Activity Tolerance Patient tolerated treatment well    Behavior During Therapy WFL for tasks assessed/performed             Past Medical History:  Diagnosis Date   Acute H. pylori gastric ulcer    Coffee ground emesis    Diabetes mellitus (HCC)    Diabetic gastroparesis (HCC)    DKA (diabetic ketoacidosis) (HCC) 02/24/2021   Gastroparesis    GERD (gastroesophageal reflux disease)    Hypertension    Hyperthyroidism    Intractable nausea and vomiting 04/20/2021   Normocytic anemia 04/20/2020   Prolonged Q-T interval on ECG    Past Surgical History:  Procedure Laterality Date   AMPUTATION Bilateral 10/24/2022   Procedure: BILATERAL BELOW KNEE AMPUTATION;  Surgeon: Nadara Mustard, MD;  Location: [facility] OR;  Service: Orthopedics;  Laterality: Bilateral;   AMPUTATION Bilateral 10/08/2022   Procedure: BILATERAL LEG DEBRIDEMENT;  Surgeon: Nadara Mustard, MD;  Location: [facility] OR;  Service: Orthopedics;  Laterality: Bilateral;   AMPUTATION Bilateral 11/14/2022   Procedure: BILATERAL ABOVE KNEE AMPUTATION;  Surgeon: Nadara Mustard, MD;  Location: [facility] OR;  Service: Orthopedics;  Laterality: Bilateral;   AMPUTATION TOE Left 03/10/2018   Procedure: AMPUTATION FIFTH TOE;  Surgeon: Vivi Barrack, DPM;  Location: [facility];  Service: Podiatry;  Laterality: Left;   APPLICATION OF WOUND VAC Bilateral 10/12/2022    Procedure: APPLICATION OF WOUND VAC BILATERAL LEGS;  Surgeon: Myrene Galas, MD;  Location: [facility] OR;  Service: Orthopedics;  Laterality: Bilateral;   APPLICATION OF WOUND VAC Bilateral 10/24/2022   Procedure: APPLICATION OF WOUND VAC;  Surgeon: Nadara Mustard, MD;  Location: [facility] OR;  Service: Orthopedics;  Laterality: Bilateral;   BIOPSY  01/28/2020   Procedure: BIOPSY;  Surgeon: Kathi Der, MD;  Location: [facility];  Service: Gastroenterology;;   BOTOX INJECTION  08/26/2021   Procedure: BOTOX INJECTION;  Surgeon: Sherrilyn Rist, MD;  Location: [facility];  Service: Gastroenterology;;   ESOPHAGOGASTRODUODENOSCOPY N/A 01/28/2020   Procedure: ESOPHAGOGASTRODUODENOSCOPY (EGD);  Surgeon: Kathi Der, MD;  Location: [facility];  Service: Gastroenterology;  Laterality: N/A;   ESOPHAGOGASTRODUODENOSCOPY (EGD) WITH PROPOFOL Left 09/08/2015   Procedure: ESOPHAGOGASTRODUODENOSCOPY (EGD) WITH PROPOFOL;  Surgeon: Willis Modena, MD;  Location: [facility] ENDOSCOPY;  Service: Endoscopy;  Laterality: Left;   ESOPHAGOGASTRODUODENOSCOPY (EGD) WITH PROPOFOL N/A 08/26/2021   Procedure: ESOPHAGOGASTRODUODENOSCOPY (EGD) WITH PROPOFOL;  Surgeon: Sherrilyn Rist, MD;  Location: [facility];  Service: Gastroenterology;  Laterality: N/A;   GASTRIC STIMULATOR IMPLANT SURGERY  06/2022   at [facility]   I & D EXTREMITY Bilateral 10/12/2022   Procedure: IRRIGATION AND  DEBRIDEMENT  BILATERAL LEGS;  Surgeon: Myrene Galas, MD;  Location: [facility] OR;  Service: Orthopedics;  Laterality: Bilateral;   I & D EXTREMITY Right 10/13/2022   Procedure: RIGHT THIGH WOUND  VAC EXCHANGE;  Surgeon: Roby Lofts, MD;  Location: [facility] OR;  Service: Orthopedics;  Laterality: Right;   I & D EXTREMITY Bilateral 10/17/2022   Procedure: BILATERAL THIGH AND LEG DEBRIDEMENT, PARTIAL BILATERAL FOOT AMPUTATION;  Surgeon: Nadara Mustard, MD;  Location: [facility] OR;  Service: Orthopedics;  Laterality: Bilateral;   I & D EXTREMITY Bilateral 11/05/2022    Procedure: BILATERAL THIGH DEBRIDEMENT;  Surgeon: Nadara Mustard, MD;  Location: [facility] OR;  Service: Orthopedics;  Laterality: Bilateral;   I & D EXTREMITY Bilateral 11/07/2022   Procedure: BILATERAL THIGH AND LEG DEBRIDEMENT;  Surgeon: Nadara Mustard, MD;  Location: [facility] OR;  Service: Orthopedics;  Laterality: Bilateral;   I & D EXTREMITY Bilateral 11/12/2022   Procedure: BILATERAL LEG DEBRIDEMENTS;  Surgeon: Nadara Mustard, MD;  Location: [facility] OR;  Service: Orthopedics;  Laterality: Bilateral;   IRRIGATION AND DEBRIDEMENT FOOT Bilateral 10/05/2022   Procedure: IRRIGATION AND DEBRIDEMENT FOOT;  Surgeon: [name], DPM;  Location: [facility];  Service: Orthopedics/Podiatry;  Laterality: Bilateral;   WISDOM TOOTH EXTRACTION     Patient Active Problem List   Diagnosis Date Noted   Influenza A 04/05/2023   Elevated lipase 04/05/2023   Esophagitis 03/24/2023   SIRS (systemic inflammatory response syndrome) (HCC) 03/02/2023   Fever 03/02/2023   Metabolic acidosis 03/02/2023   Hypertensive urgency 02/21/2023   Chronic pain 02/21/2023   Abscess of thigh 11/28/2022   C. 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   Hypokalemia 11/27/2022   Effusion, left knee 11/05/2022   MDD (major depressive disorder), recurrent episode, moderate (HCC) 10/16/2022   Necrotizing fasciitis of pelvic region and thigh (HCC) 10/08/2022   Necrotizing fasciitis of lower leg (HCC) 10/08/2022   Necrotizing fasciitis (HCC) 10/08/2022   Sepsis due to cellulitis (HCC) 10/03/2022   Cellulitis of lower extremity 10/03/2022   Multinodular goiter 06/18/2022   Malnutrition of moderate degree 04/18/2022   Intractable vomiting with nausea 04/17/2022   DKA (diabetic ketoacidosis) (HCC) 03/04/2022   Nausea vomiting and diarrhea 12/25/2021   Diabetic gastroparesis (HCC) 10/09/2021   Drug-seeking behavior 09/21/2021   Essential hypertension 08/25/2021   Diabetes mellitus type 1 with  complications (HCC) 08/25/2021   GERD without esophagitis 08/25/2021   Hematemesis    Prolonged Q-T interval on ECG    Nausea and vomiting 07/20/2021   Anemia of chronic disease 06/06/2021   Dehydration 06/04/2021   Hypomagnesemia 04/21/2021   Ulcerated, foot, left, with fat layer exposed (HCC) 04/20/2021   Uncontrolled type 1 diabetes mellitus with hyperglycemia, with long-term current use of insulin (HCC) 12/18/2020   DM type 1 (diabetes mellitus, type 1) (HCC) 12/18/2020   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) 11/09/2020   Diabetic retinopathy of both eyes associated with type 1 diabetes mellitus (HCC) 09/24/2020   Diabetic gastroparesis associated with type 1 diabetes mellitus (HCC) 08/12/2020   Acute kidney injury superimposed on chronic kidney disease (HCC) 07/25/2020   Generalized abdominal pain 07/23/2020   GERD (gastroesophageal reflux disease) 07/23/2020   Diabetic nephropathy associated with type 1 diabetes mellitus (HCC) 06/27/2020   Sepsis (HCC) 05/27/2020   HLD (hyperlipidemia) 05/23/2020   Sinus tachycardia 05/09/2020   Anemia 04/20/2020   Depression with anxiety 07/05/2019   Diabetic polyneuropathy associated with type 1 diabetes mellitus (HCC) 09/24/2017   Gastroparesis    Goiter 10/05/2009   DM2 (diabetes mellitus, type 2) (HCC) 10/05/2009   Hypertension associated with diabetes (HCC)  10/05/2009    ONSET DATE: 04/07/23 referral  REFERRING DIAG: 4424804972 (ICD-10-CM) - S/P AKA (above knee amputation) bilateral (HCC)   THERAPY DIAG:  Muscle weakness (generalized)  Other symptoms and signs involving the musculoskeletal system  Abnormal posture  Other abnormalities of gait and mobility  Rationale for Evaluation and Treatment: Rehabilitation  SUBJECTIVE:                                                                                                                                                                                              SUBJECTIVE STATEMENT: Patient arrives to clinic in wheelchair w/ prosthetics doffed with friend, Kenard Gower. She saw her PCP about eye puffiness, reports this was a stye and she has been using medicated eye drops. Denies falls and near falls and has not worked on [employer] for home due to not feeling comfortable with nursing aid being able to help but says she trusts Kenard Gower. Reports did go to ED last session but was sent home with BP medication.   Pt accompanied by: friend  PERTINENT HISTORY: DM, diabetic gastroparesis, GERD, HTN, hyperthyroidism  PAIN:  Are you having pain? No  VITALS: There were no vitals filed for this visit.  PRECAUTIONS: Fall; B diabetic retinopathy  PATIENT GOALS: "to be able to walk again"                                                                                                                              TREATMENT  Goals check/TherAct + brief prosthetic training: -Pt dons bilateral 5-ply and prosthetics supervision level, trialed various ply levels between standing reps, up to 5-ply + 3 ply bilaterally and then trialed removing all ply on left due to continued rotation of socket, patient did bring sock ply bag as requrested from last session -Push to stand from transport bar and then transitioning hands to 2WW, educated on not being able to pull to stand to 2WW as this was patient initial attempt - Performed total of ~6 reps of sit to stand transfer form manual chair varying sock ply to get  ideal fit, challenge with ER of L leg today both with and without sock additions (required minA on reps to cue for foot placement and prevent posterior instability) - Trialed walking with 2WW requiring mix of CGA-minA for foot placement and to prevent posterior instability with MWC follow, patient with increased anxiety with steps which impacted stepping, intially only 1 x 2 feet due to rotation of socket, after trial of both additional socks and fewer socks on L reduced  rotation with few socks though still limiting factor and only able to ambulate ~10 feet in today's session, patient returned to chair and doffed sockets and socks independently     PATIENT EDUCATION: Education details: Continue HEP - discussed way to modify at home  Person educated: Patient and friend Education method: Explanation and Demonstration Education comprehension: verbalized understanding and needs further education  HOME EXERCISE PROGRAM: -practice donning/doffing prosthetic Access Code: [email or web address] URL: [email or web address] Date: 04/20/2023 Prepared by: Merry Lofty  Exercises - Prone Hip Extension with Residual Limb (BKA)  - 1 x daily - 7 x weekly - 3 sets - 10 reps - Sidelying Hip Abduction (AKA)  - 1 x daily - 7 x weekly - 3 sets - 10 reps - Supine Piriformis Stretch with Residual Limb (AKA)  - 1 x daily - 7 x weekly - 3 sets - 10 reps  Do each exercise 1-2  times per day Do each exercise 10 repetitions Hold each exercise for 2 seconds to feel your location  AT SINK FIND YOUR MIDLINE POSITION AND PLACE FEET EQUAL DISTANCE FROM THE MIDLINE.  Try to find this position when standing still for activities.   USE TAPE ON FLOOR TO MARK THE MIDLINE POSITION. You also should try to feel with your limb pressure in socket.  You are trying to feel with limb what you used to feel with the bottom of your foot.  Side to Side Shift: Moving your hips only (not shoulders): move weight onto your left leg, HOLD/FEEL.  Move back to equal weight on each leg, HOLD/FEEL. Move weight onto your right leg, HOLD/FEEL. Move back to equal weight on each leg, HOLD/FEEL. Repeat.  Start with both hands on sink, progress to right hand only, then no hands.  Front to Back Shift: Moving your hips only (not shoulders): move your weight forward onto your toes, HOLD/FEEL. Move your weight back to equal Flat Foot on both legs, HOLD/FEEL. Move your weight back onto your heels, HOLD/FEEL. Move your  weight back to equal on both legs, HOLD/FEEL. Repeat.   tart with both hands on sink, progress to right hand only, then no hands. Moving Cones / Cups: With equal weight on each leg: Hold on with one hand the first time, then progress to no hand supports. Move cups from one side of sink to the other. Place cups ~2" out of your reach, progress to 10" beyond reach.  Place one hand in middle of sink and reach with other hand. Do both arms.  Then hover one hand and move cups with other hand. -just use a crossbody reach to end of safe weight shift ROM Overhead/Upward Reaching: alternated reaching up to top cabinets or ceiling if no cabinets present. Keep equal weight on each leg. Start with one hand support on counter while other hand reaches and progress to no hand support with reaching.  ace one hand in middle of sink and reach with other hand. Do both arms.  Then hover one hand and move cups with other  hand.  5.   Looking Over Shoulders: With equal weight on each leg: alternate turning to look over your shoulders with one hand support on counter as needed.  Start with head motions only to look in front of shoulder, then even with shoulder and progress to looking behind you. To look to side, move head /eyes, then shoulder on side looking pulls back, shift more weight to side looking and pull hip back. ace one hand in middle of sink and let go with other hand so your shoulder can pull back. Switch hands to look other way.   Then hover one hand and move cups with other hand.  6.  Stepping with leg that is not amputated:  Move items under cabinet out of your way. Shift your hips/pelvis so weight on prosthesis. SLOWLY step other leg so front of foot is in cabinet. Then step back to floor. - just hike the hip and place the foot back in the starting spot  GOALS: Goals reviewed with patient? Yes  SHORT TERM GOALS: Target date: 05/15/23  Pt will be independent with initial HEP for improved gait mechanics and balance   Baseline: to be updated, reports she has not been doing at home doesn't trust CNA Goal status: NOT MET  2.  Patient will ambulate at least 31ft with LRAD and no more than MinA to demonstrate improved mobility  Baseline: 62ft // bars + MinA, 10 feet with 2WW + minA Goal status: NOT met  3.  Patient will complete sit <> stand with LRAD and no more than CGA for improved independence with transfers Baseline: MinA, required minA in today's session  Goal status: NOT met - required minA due to posterior instability  4.  Patient will complete floor transfer with assist as initial step of fall recovery  Baseline: unable to complete on eval Goal status: To be assessed    LONG TERM GOALS: Target date: 07/10/23  Pt will be independent with final HEP for improved gait mechanics and balance  Baseline: to be updated Goal status: INITIAL  Patient will ambulate at least 62ft with LRAD, ModI to demonstrate improved mobility  Baseline: 71ft // bars + MinA Goal status: INITIAL  Patient will complete sit <> stand with LRAD. SBA for improved independence with transfers Baseline: MinA Goal status: REVISED  Patient will complete floor transfer without assist as initial step of fall recovery  Baseline: unable to complete on eval Goal status: INITIAL  5. Patient will don/doff prosthetics independently   Baseline: MaxA  Goal status: INITIAL  ASSESSMENT:  CLINICAL IMPRESSION: Focus of skilled session on assessment of STG. Patient's progress poor due to limited ability to practice standing at home, requiring additional assistance with transfers and gait in today's session. Patient also limited by ER of L prosthetic today which seemed despite multiple trials of socks. With increased compliance with HEP with assistance of friend as patient feels comfortable, anticipate greater progress. Patient's fear of falling also limiter and will benefit from floor transfer work in future sessions to boost confidence.  Patient is increasing independence with donning and doffing prosthetics. Continue per POC.  OBJECTIVE IMPAIRMENTS: Abnormal gait, decreased activity tolerance, decreased balance, decreased endurance, decreased knowledge of condition, decreased knowledge of use of DME, decreased strength, impaired vision/preception, and prosthetic dependency .   ACTIVITY LIMITATIONS: carrying, lifting, standing, squatting, transfers, hygiene/grooming, locomotion level, and caring for others  PARTICIPATION LIMITATIONS: meal prep, cleaning, interpersonal relationship, driving, shopping, community activity, and occupation  PERSONAL FACTORS: Age, Behavior  pattern, Past/current experiences, Social background, Time since onset of injury/illness/exacerbation, Transportation, and 3+ comorbidities: see above  are also affecting patient's functional outcome.   REHAB POTENTIAL: Good  CLINICAL DECISION MAKING: Stable/uncomplicated  EVALUATION COMPLEXITY: Low  PLAN:  PT FREQUENCY: 2x/week  PT DURATION: 12 weeks  PLANNED INTERVENTIONS: 97164- PT Re-evaluation, 97110-Therapeutic exercises, 97530- Therapeutic activity, 97112- Neuromuscular re-education, 97535- Self Care, 16109- Manual therapy, [phone]- Gait training, [phone]- Prosthetic training, [phone]- Aquatic Therapy, Patient/Family education, Balance training, Stair training, Scar mobilization, Vestibular training, Visual/preceptual remediation/compensation, DME instructions, and Wheelchair mobility training  PLAN FOR NEXT SESSION: ASSESS STGs floor transfer, Gait, balance, floor transfers (will likely need +2), donning/doffing prosthetics, how is modified sink HEP?, balance recovery in standing/static stability w/ dec UE reliance   Carmelia Bake, PT, DPT   05/19/2023, 5:28 PM

## 2023-05-21 ENCOUNTER — Ambulatory Visit: Admitting: Occupational Therapy

## 2023-05-21 ENCOUNTER — Ambulatory Visit: Payer: 59 | Attending: Orthopedic Surgery

## 2023-05-21 VITALS — BP 127/79 | HR 94

## 2023-05-21 DIAGNOSIS — R293 Abnormal posture: Secondary | ICD-10-CM | POA: Insufficient documentation

## 2023-05-21 DIAGNOSIS — M6281 Muscle weakness (generalized): Secondary | ICD-10-CM | POA: Diagnosis present

## 2023-05-21 DIAGNOSIS — R41842 Visuospatial deficit: Secondary | ICD-10-CM | POA: Insufficient documentation

## 2023-05-21 DIAGNOSIS — R2689 Other abnormalities of gait and mobility: Secondary | ICD-10-CM | POA: Insufficient documentation

## 2023-05-21 DIAGNOSIS — R29898 Other symptoms and signs involving the musculoskeletal system: Secondary | ICD-10-CM | POA: Diagnosis present

## 2023-05-21 NOTE — Therapy (Addendum)
 OUTPATIENT OCCUPATIONAL THERAPY NEURO Treatment  Patient Name: Semone Brianna Johnson-Alston MRN: [id number] DOB:02-Nov-1990, 33 y.o., female Today's Date: 05/21/2023  PCP: Vevelyn Gowers, NP  REFERRING PROVIDER: Timothy Ford, MD  END OF SESSION:  OT End of Session - 05/21/23 1556     Visit Number 3    Number of Visits 17   including eval   Date for OT Re-Evaluation 06/26/23    Authorization Type [employer] Medicare Dual/Medicaid, covered 100%    Progress Note Due on Visit 10    OT Start Time 1446    OT Stop Time 1528    OT Time Calculation (min) 42 min    Activity Tolerance Patient tolerated treatment well    Behavior During Therapy WFL for tasks assessed/performed             Past Medical History:  Diagnosis Date   Acute H. pylori gastric ulcer    Coffee ground emesis    Diabetes mellitus (HCC)    Diabetic gastroparesis (HCC)    DKA (diabetic ketoacidosis) (HCC) 02/24/2021   Gastroparesis    GERD (gastroesophageal reflux disease)    Hypertension    Hyperthyroidism    Intractable nausea and vomiting 04/20/2021   Normocytic anemia 04/20/2020   Prolonged Q-T interval on ECG    Past Surgical History:  Procedure Laterality Date   AMPUTATION Bilateral 10/24/2022   Procedure: BILATERAL BELOW KNEE AMPUTATION;  Surgeon: Timothy Ford, MD;  Location: [facility] OR;  Service: Orthopedics;  Laterality: Bilateral;   AMPUTATION Bilateral 10/08/2022   Procedure: BILATERAL LEG DEBRIDEMENT;  Surgeon: Timothy Ford, MD;  Location: [facility] OR;  Service: Orthopedics;  Laterality: Bilateral;   AMPUTATION Bilateral 11/14/2022   Procedure: BILATERAL ABOVE KNEE AMPUTATION;  Surgeon: Timothy Ford, MD;  Location: [facility] OR;  Service: Orthopedics;  Laterality: Bilateral;   AMPUTATION TOE Left 03/10/2018   Procedure: AMPUTATION FIFTH TOE;  Surgeon: Charity Conch, DPM;  Location: [facility];  Service: Podiatry;  Laterality: Left;   APPLICATION OF WOUND VAC Bilateral 10/12/2022   Procedure:  APPLICATION OF WOUND VAC BILATERAL LEGS;  Surgeon: Hardy Lia, MD;  Location: [facility] OR;  Service: Orthopedics;  Laterality: Bilateral;   APPLICATION OF WOUND VAC Bilateral 10/24/2022   Procedure: APPLICATION OF WOUND VAC;  Surgeon: Timothy Ford, MD;  Location: [facility] OR;  Service: Orthopedics;  Laterality: Bilateral;   BIOPSY  01/28/2020   Procedure: BIOPSY;  Surgeon: Felecia Hopper, MD;  Location: [facility];  Service: Gastroenterology;;   BOTOX INJECTION  08/26/2021   Procedure: BOTOX INJECTION;  Surgeon: Albertina Hugger, MD;  Location: [facility];  Service: Gastroenterology;;   ESOPHAGOGASTRODUODENOSCOPY N/A 01/28/2020   Procedure: ESOPHAGOGASTRODUODENOSCOPY (EGD);  Surgeon: Felecia Hopper, MD;  Location: [facility];  Service: Gastroenterology;  Laterality: N/A;   ESOPHAGOGASTRODUODENOSCOPY (EGD) WITH PROPOFOL Left 09/08/2015   Procedure: ESOPHAGOGASTRODUODENOSCOPY (EGD) WITH PROPOFOL;  Surgeon: Evangeline Hilts, MD;  Location: [facility] ENDOSCOPY;  Service: Endoscopy;  Laterality: Left;   ESOPHAGOGASTRODUODENOSCOPY (EGD) WITH PROPOFOL N/A 08/26/2021   Procedure: ESOPHAGOGASTRODUODENOSCOPY (EGD) WITH PROPOFOL;  Surgeon: Albertina Hugger, MD;  Location: [facility];  Service: Gastroenterology;  Laterality: N/A;   GASTRIC STIMULATOR IMPLANT SURGERY  06/2022   at [facility]   I & D EXTREMITY Bilateral 10/12/2022   Procedure: IRRIGATION AND  DEBRIDEMENT  BILATERAL LEGS;  Surgeon: Hardy Lia, MD;  Location: [facility] OR;  Service: Orthopedics;  Laterality: Bilateral;   I & D EXTREMITY Right 10/13/2022   Procedure: RIGHT THIGH WOUND VAC EXCHANGE;  Surgeon: Laneta Pintos, MD;  Location: [facility] OR;  Service: Orthopedics;  Laterality: Right;   I & D EXTREMITY Bilateral 10/17/2022   Procedure: BILATERAL THIGH AND LEG DEBRIDEMENT, PARTIAL BILATERAL FOOT AMPUTATION;  Surgeon: Timothy Ford, MD;  Location: [facility] OR;  Service: Orthopedics;  Laterality: Bilateral;   I & D EXTREMITY Bilateral 11/05/2022   Procedure:  BILATERAL THIGH DEBRIDEMENT;  Surgeon: Timothy Ford, MD;  Location: [facility] OR;  Service: Orthopedics;  Laterality: Bilateral;   I & D EXTREMITY Bilateral 11/07/2022   Procedure: BILATERAL THIGH AND LEG DEBRIDEMENT;  Surgeon: Timothy Ford, MD;  Location: [facility] OR;  Service: Orthopedics;  Laterality: Bilateral;   I & D EXTREMITY Bilateral 11/12/2022   Procedure: BILATERAL LEG DEBRIDEMENTS;  Surgeon: Timothy Ford, MD;  Location: [facility] OR;  Service: Orthopedics;  Laterality: Bilateral;   IRRIGATION AND DEBRIDEMENT FOOT Bilateral 10/05/2022   Procedure: IRRIGATION AND DEBRIDEMENT FOOT;  Surgeon: Dot Gazella, DPM;  Location: [facility];  Service: Orthopedics/Podiatry;  Laterality: Bilateral;   WISDOM TOOTH EXTRACTION     Patient Active Problem List   Diagnosis Date Noted   Influenza A 04/05/2023   Elevated lipase 04/05/2023   Esophagitis 03/24/2023   SIRS (systemic inflammatory response syndrome) (HCC) 03/02/2023   Fever 03/02/2023   Metabolic acidosis 03/02/2023   Hypertensive urgency 02/21/2023   Chronic pain 02/21/2023   Abscess of thigh 11/28/2022   C. 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   Hypokalemia 11/27/2022   Effusion, left knee 11/05/2022   MDD (major depressive disorder), recurrent episode, moderate (HCC) 10/16/2022   Necrotizing fasciitis of pelvic region and thigh (HCC) 10/08/2022   Necrotizing fasciitis of lower leg (HCC) 10/08/2022   Necrotizing fasciitis (HCC) 10/08/2022   Sepsis due to cellulitis (HCC) 10/03/2022   Cellulitis of lower extremity 10/03/2022   Multinodular goiter 06/18/2022   Malnutrition of moderate degree 04/18/2022   Intractable vomiting with nausea 04/17/2022   DKA (diabetic ketoacidosis) (HCC) 03/04/2022   Nausea vomiting and diarrhea 12/25/2021   Diabetic gastroparesis (HCC) 10/09/2021   Drug-seeking behavior 09/21/2021   Essential hypertension 08/25/2021   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) 08/25/2021   GERD without esophagitis 08/25/2021   Hematemesis    Prolonged Q-T interval on ECG    Nausea and vomiting 07/20/2021   Anemia of chronic disease 06/06/2021   Dehydration 06/04/2021   Hypomagnesemia 04/21/2021   Ulcerated, foot, left, with fat layer exposed (HCC) 04/20/2021   Uncontrolled type 1 diabetes mellitus with hyperglycemia, with long-term current use of insulin (HCC) 12/18/2020   DM type 1 (diabetes mellitus, type 1) (HCC) 12/18/2020   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) 11/09/2020   Diabetic retinopathy of both eyes associated with type 1 diabetes mellitus (HCC) 09/24/2020   Diabetic gastroparesis associated with type 1 diabetes mellitus (HCC) 08/12/2020   Acute kidney injury superimposed on chronic kidney disease (HCC) 07/25/2020   Generalized abdominal pain 07/23/2020   GERD (gastroesophageal reflux disease) 07/23/2020   Diabetic nephropathy associated with type 1 diabetes mellitus (HCC) 06/27/2020   Sepsis (HCC) 05/27/2020   HLD (hyperlipidemia) 05/23/2020   Sinus tachycardia 05/09/2020   Anemia 04/20/2020   Depression with anxiety 07/05/2019   Diabetic polyneuropathy associated with type 1 diabetes mellitus (HCC) 09/24/2017   Gastroparesis    Goiter 10/05/2009   DM2 (diabetes mellitus, type 2) (HCC) 10/05/2009   Hypertension associated with diabetes (HCC) 10/05/2009  ONSET DATE: 04/16/23 (referral date)  REFERRING DIAG: [phone] (ICD-10-CM) - S/P AKA (above knee amputation) bilateral   THERAPY DIAG:  Muscle weakness (generalized)  Other symptoms and signs involving the musculoskeletal system  Rationale for Evaluation and Treatment: Rehabilitation  SUBJECTIVE:   SUBJECTIVE STATEMENT:  Pt reported taking BP medication at home. Pt reported theraputty going well.   Pt accompanied by: self,  friend Carlus Chihuahua)  PERTINENT HISTORY: DM, diabetic gastroparesis, GERD, HTN, hyperthyroidism   PRECAUTIONS: Fall; B  diabetic retinopathy   WEIGHT BEARING RESTRICTIONS: No  PAIN:  Are you having pain? No  FALLS: Has patient fallen in last 6 months? Yes. Number of falls 1 - around Thanksgiving when attempting to transfer to a second w/c  LIVING ENVIRONMENT:  Lives with: lives alone Lives in: House/apartment Stairs: No Has following equipment at home: Wheelchair (manual) and shower chair; aide 7 days/week for 4 hours  PLOF: Independent with basic ADLs, community mobility: assistance from friends  PATIENT GOALS: "I want to work on standing and balance"  OBJECTIVE:  Note: Objective measures were completed at Evaluation unless otherwise noted.  Vitals:   05/21/23 1447  BP: 127/79  Pulse: 94     HAND DOMINANCE: Right  ADLs: Overall ADLs: ind, cautious Eating: ind Grooming: ind UB Dressing: ind, some difficulty with donning standard bras though no difficulty with donning sports bras LB Dressing: ind Toileting: ind Bathing: prefers at least direct or distant supervision though has completed ind - "I'm scared because shower chair moves a little bit because it's a little bigger than the tub so I feel better when someone is there." Tub Shower transfers: tub transfer bench Equipment: tub transfer bench, tub/shower  IADLs: Meal Prep: difficulty cutting food for meal prep/cooking when attempting to stand. Pt reported typically using microwave meals and sometimes using stove/oven. Pt reported trying to not be afraid of doing "normal stuff" again.  Handwriting:  Pt reported no need for handwriting, decreased vision.  Pt reported using voice text to communicate using phone.  MOBILITY STATUS:  w/c, reported standing in prosthetics only when coming to therapy though will attempt to stand at home with another person to supervise  soon  POSTURE COMMENTS:  rounded shoulders and forward head  ACTIVITY TOLERANCE: Activity tolerance: about the same as PLOF, more tired after standing tasks though only  for "just 10 minutes"  FUNCTIONAL OUTCOME MEASURES: PSFS: 6.7   Total score = sum of the activity scores/number of activities Minimum detectable change (90%CI) for average score = 2 points Minimum detectable change (90%CI) for single activity score = 3 points    UPPER EXTREMITY ROM:    Active ROM Right eval Left eval  Shoulder flexion [facility]  Shoulder abduction    Shoulder adduction    Shoulder extension    Shoulder internal rotation    Shoulder external rotation    Elbow flexion    Elbow extension    Wrist flexion    Wrist extension    Wrist ulnar deviation    Wrist radial deviation    Wrist pronation    Wrist supination    (Blank rows = not tested)  UPPER EXTREMITY MMT:     MMT Right eval Left eval  Shoulder flexion    Shoulder abduction    Shoulder adduction    Shoulder extension    Shoulder internal rotation    Shoulder external rotation    Middle trapezius    Lower trapezius    Elbow flexion    Elbow  extension    Wrist flexion    Wrist extension    Wrist ulnar deviation    Wrist radial deviation    Wrist pronation    Wrist supination    (Blank rows = not tested)  HAND FUNCTION: Grip strength: Right: 57 lbs; Left: 39 lbs  Pt reported some difficulty with tasks requiring strength of L side. Pt reported noticing weakness of L side compared to R side.  COORDINATION: 9 Hole Peg test: Right: 37 sec; Left: 33 sec, decreased speed secondary to visual deficits  SENSATION: Pt denied numbness/tingling/discomfort of BLE and BUE.  EDEMA: R eyelid swollen.  MUSCLE TONE: RUE: Within functional limits and LUE: Within functional limits  COGNITION: Overall cognitive status: Within functional limits for tasks assessed  VISION: Subjective report: Corrective lens no longer help, low vision per pt, better seeing close up. Pt reported some peripheral vision L side, less peripheral vision R side. Baseline vision: B diabetic retinopathy: "it's low."  Visual  history:  B diabetic retinopathy  VISION ASSESSMENT: Visual tracking: Difficulty tracking black pen. Improved ability to track brightly colored marker to all 4 quadrants.  PERCEPTION: Not tested  PRAXIS: Not tested  OBSERVATIONS: Pt in w/c without prosthetics though with BLE prosthetics available nearby. Pt pleasant with decreased vision secondary to B retinopathy. Pt's friend assisted pt with w/c mobility today.                                                                                                                            TREATMENT DATE:    Self-Care  OT assessed vital signs, see vital signs. BP within therapeutic parameters.  OT educated pt on vision compensation strategies and importance of safety. (e.g. night light, reflective neon tape, etc.) Pt and friend verbalized understanding. Pt's friend asked about using a blacklight. OT recommended to instead use stable, standard light source in room to increase visual contrast and improve safety. Pt verbalized understanding.  OT obtained pt's preferred email for support group for individuals with amputations. OT provided pt's preferred email to support group representative.   OT educated pt on online and community resources. Pt verbalized understanding.  TherAct OT assessed pt's progress towards goals, see below for updates.   OT reviewed Theraputty HEP, pt returned demo. OT discussed options for small FM manipulatives to place in putty. Pt verbalized understanding.  Resistive clips with LUE - to improve FM coordination and dexterity, to improve functional reach, to improve LUE strengthening - Pt benefited from OT placing "partition" as background of task to reduce clutter in background for vision. Pt demo'd good understanding of vision compensation strategy of using sense of touch and closing R eye to compensate for decreased vision.  Tan flex bar - supination, pronation, twist - 2 set, 10 reps each - to improve LUE  strengthening and gross motor coordination.   PATIENT EDUCATION: Education details: see today's tx above Person educated: Patient and friend Education method: Explanation, Demonstration, Verbal cues,  and Handouts Education comprehension: verbalized understanding, returned demonstration, verbal cues required, and needs further education  HOME EXERCISE PROGRAM: 05/18/2023: putty and vision strategies  GOALS: Goals reviewed with patient? Yes  SHORT TERM GOALS: Target date: 05/29/23  Pt will recall or demo at least 2 visual compensation strategies Baseline: impaired vision secondary to diabetic retinopathy 05/21/23 - Pt ind recalled using line guide, turning head to look to R, using finger to visually track information.  Goal status: MET  2.  Pt will verbalize or demo understanding of safe simulated shower transfers modI with A/E and adaptive strategies PRN. Baseline: Pt ind though fearful and prefers supervision during showers and shower transfers d/t unstable shower chair/bench. Pt has tub transfer bench and transfer board available.  Goal status: INITIAL  3.  Pt will ind demo understanding of LUE HEP. Baseline: New to outpt OT 05/21/23 - Pt returned demo of theraputty.  Goal status: MET  4. Pt will verbalize understanding of community resources for individuals with amputations.  Baseline: new to outpt OT  05/21/23 - Pt verbalized understanding of resources.  Goal status: MET  LONG TERM GOALS: Target date: 06/26/23  Pt will ind demo understanding of updated HEP. Baseline: new to outpt OT Goal status: INITIAL  2.  Patient will report at least two-point increase in average PSFS score or at least three-point increase in a single activity score indicating functionally significant improvement given minimum detectable change using BLE prosthetics PRN. Baseline: 6.7 total score (See above for individual activity scores) without use of BLE prosthetics.  Goal status: INITIAL  3.  Pt will demo  at least 45 lbs LUE grip strength as needed to open jars/containers. Baseline: Grip strength: Right: 57 lbs; Left: 39 lbs Goal status: INITIAL  4.  Pt will complete simple meal prep tasks modI using BLE prosthetics as able. Baseline: pt beginning to trial use of BLE prosthetics. Pt reported difficulty with meal prep tasks currently. Goal status: INITIAL  ASSESSMENT:  CLINICAL IMPRESSION: Pt met 3 STG, indicating good progress towards goals. Pt tolerated tasks well today. Will continue to monitor BP. Pt has upcoming w/c eval and therefore OT to provide additional vision compensation strategies for navigating home environment in w/c.  PERFORMANCE DEFICITS: in functional skills including ADLs, IADLs, coordination, dexterity, proprioception, edema, strength, flexibility, Fine motor control, Gross motor control, mobility, balance, body mechanics, endurance, decreased knowledge of use of DME, vision, and UE functional use, cognitive skills including energy/drive, and psychosocial skills including environmental adaptation.   IMPAIRMENTS: are limiting patient from ADLs, IADLs, rest and sleep, work, leisure, and social participation.   CO-MORBIDITIES: may have co-morbidities  that affects occupational performance. Patient will benefit from skilled OT to address above impairments and improve overall function.  REHAB POTENTIAL: Good  PLAN:  OT FREQUENCY: 2x/week  OT DURATION: 8 weeks  PLANNED INTERVENTIONS: 97168 OT Re-evaluation, 97535 self care/ADL training, 16109 therapeutic exercise, 97530 therapeutic activity, 97112 neuromuscular re-education, 97140 manual therapy, 97035 ultrasound, 97018 paraffin, 60454 fluidotherapy, 97010 moist heat, 97010 cryotherapy, 97034 contrast bath, 97760 Orthotics management and training, 09811 Splinting (initial encounter), [id number] Subsequent splinting/medication, passive range of motion, functional mobility training, visual/perceptual remediation/compensation, energy  conservation, patient/family education, and DME and/or AE instructions  RECOMMENDED OTHER SERVICES: PT eval already completed  CONSULTED AND AGREED WITH PLAN OF CARE: Patient  PLAN FOR NEXT SESSION:  Monitor per medical event on 05/14/23  Review - LUE theraputty PRN Vision strategies for w/c navigation - Note: pt has upcoming w/c evaluation Practice standing  with BLE prosthetics - incorporate functional tasks as able   Oakley Bellman, OT 05/21/2023, 4:10 PM

## 2023-05-21 NOTE — Therapy (Signed)
 OUTPATIENT PHYSICAL THERAPY NEURO TREATMENT   Patient Name: Kelli Hudson MRN: [id number] DOB:03-29-1990, 33 y.o., female Today's Date: 05/21/2023   PCP: Cristino Martes, NP REFERRING PROVIDER: Aldean Baker, MD  END OF SESSION:  PT End of Session - 05/21/23 1359     Visit Number 9    Number of Visits 25    Date for PT Re-Evaluation 07/10/23    Authorization Type Medicare/Medicaid    Progress Note Due on Visit 10    PT Start Time 1400    PT Stop Time 1445    PT Time Calculation (min) 45 min    Equipment Utilized During Treatment Gait belt    Activity Tolerance Patient tolerated treatment well    Behavior During Therapy WFL for tasks assessed/performed             Past Medical History:  Diagnosis Date   Acute H. pylori gastric ulcer    Coffee ground emesis    Diabetes mellitus (HCC)    Diabetic gastroparesis (HCC)    DKA (diabetic ketoacidosis) (HCC) 02/24/2021   Gastroparesis    GERD (gastroesophageal reflux disease)    Hypertension    Hyperthyroidism    Intractable nausea and vomiting 04/20/2021   Normocytic anemia 04/20/2020   Prolonged Q-T interval on ECG    Past Surgical History:  Procedure Laterality Date   AMPUTATION Bilateral 10/24/2022   Procedure: BILATERAL BELOW KNEE AMPUTATION;  Surgeon: Nadara Mustard, MD;  Location: [facility] OR;  Service: Orthopedics;  Laterality: Bilateral;   AMPUTATION Bilateral 10/08/2022   Procedure: BILATERAL LEG DEBRIDEMENT;  Surgeon: Nadara Mustard, MD;  Location: [facility] OR;  Service: Orthopedics;  Laterality: Bilateral;   AMPUTATION Bilateral 11/14/2022   Procedure: BILATERAL ABOVE KNEE AMPUTATION;  Surgeon: Nadara Mustard, MD;  Location: [facility] OR;  Service: Orthopedics;  Laterality: Bilateral;   AMPUTATION TOE Left 03/10/2018   Procedure: AMPUTATION FIFTH TOE;  Surgeon: Vivi Barrack, DPM;  Location: [facility];  Service: Podiatry;  Laterality: Left;   APPLICATION OF WOUND VAC Bilateral 10/12/2022    Procedure: APPLICATION OF WOUND VAC BILATERAL LEGS;  Surgeon: Myrene Galas, MD;  Location: [facility] OR;  Service: Orthopedics;  Laterality: Bilateral;   APPLICATION OF WOUND VAC Bilateral 10/24/2022   Procedure: APPLICATION OF WOUND VAC;  Surgeon: Nadara Mustard, MD;  Location: [facility] OR;  Service: Orthopedics;  Laterality: Bilateral;   BIOPSY  01/28/2020   Procedure: BIOPSY;  Surgeon: Kathi Der, MD;  Location: [facility];  Service: Gastroenterology;;   BOTOX INJECTION  08/26/2021   Procedure: BOTOX INJECTION;  Surgeon: Sherrilyn Rist, MD;  Location: [facility];  Service: Gastroenterology;;   ESOPHAGOGASTRODUODENOSCOPY N/A 01/28/2020   Procedure: ESOPHAGOGASTRODUODENOSCOPY (EGD);  Surgeon: Kathi Der, MD;  Location: [facility];  Service: Gastroenterology;  Laterality: N/A;   ESOPHAGOGASTRODUODENOSCOPY (EGD) WITH PROPOFOL Left 09/08/2015   Procedure: ESOPHAGOGASTRODUODENOSCOPY (EGD) WITH PROPOFOL;  Surgeon: Willis Modena, MD;  Location: [facility] ENDOSCOPY;  Service: Endoscopy;  Laterality: Left;   ESOPHAGOGASTRODUODENOSCOPY (EGD) WITH PROPOFOL N/A 08/26/2021   Procedure: ESOPHAGOGASTRODUODENOSCOPY (EGD) WITH PROPOFOL;  Surgeon: Sherrilyn Rist, MD;  Location: [facility];  Service: Gastroenterology;  Laterality: N/A;   GASTRIC STIMULATOR IMPLANT SURGERY  06/2022   at [facility]   I & D EXTREMITY Bilateral 10/12/2022   Procedure: IRRIGATION AND  DEBRIDEMENT  BILATERAL LEGS;  Surgeon: Myrene Galas, MD;  Location: [facility] OR;  Service: Orthopedics;  Laterality: Bilateral;   I & D EXTREMITY Right 10/13/2022   Procedure: RIGHT THIGH WOUND  VAC EXCHANGE;  Surgeon: Roby Lofts, MD;  Location: [facility] OR;  Service: Orthopedics;  Laterality: Right;   I & D EXTREMITY Bilateral 10/17/2022   Procedure: BILATERAL THIGH AND LEG DEBRIDEMENT, PARTIAL BILATERAL FOOT AMPUTATION;  Surgeon: Nadara Mustard, MD;  Location: [facility] OR;  Service: Orthopedics;  Laterality: Bilateral;   I & D EXTREMITY Bilateral 11/05/2022    Procedure: BILATERAL THIGH DEBRIDEMENT;  Surgeon: Nadara Mustard, MD;  Location: [facility] OR;  Service: Orthopedics;  Laterality: Bilateral;   I & D EXTREMITY Bilateral 11/07/2022   Procedure: BILATERAL THIGH AND LEG DEBRIDEMENT;  Surgeon: Nadara Mustard, MD;  Location: [facility] OR;  Service: Orthopedics;  Laterality: Bilateral;   I & D EXTREMITY Bilateral 11/12/2022   Procedure: BILATERAL LEG DEBRIDEMENTS;  Surgeon: Nadara Mustard, MD;  Location: [facility] OR;  Service: Orthopedics;  Laterality: Bilateral;   IRRIGATION AND DEBRIDEMENT FOOT Bilateral 10/05/2022   Procedure: IRRIGATION AND DEBRIDEMENT FOOT;  Surgeon: [name], DPM;  Location: [facility];  Service: Orthopedics/Podiatry;  Laterality: Bilateral;   WISDOM TOOTH EXTRACTION     Patient Active Problem List   Diagnosis Date Noted   Influenza A 04/05/2023   Elevated lipase 04/05/2023   Esophagitis 03/24/2023   SIRS (systemic inflammatory response syndrome) (HCC) 03/02/2023   Fever 03/02/2023   Metabolic acidosis 03/02/2023   Hypertensive urgency 02/21/2023   Chronic pain 02/21/2023   Abscess of thigh 11/28/2022   C. 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   Hypokalemia 11/27/2022   Effusion, left knee 11/05/2022   MDD (major depressive disorder), recurrent episode, moderate (HCC) 10/16/2022   Necrotizing fasciitis of pelvic region and thigh (HCC) 10/08/2022   Necrotizing fasciitis of lower leg (HCC) 10/08/2022   Necrotizing fasciitis (HCC) 10/08/2022   Sepsis due to cellulitis (HCC) 10/03/2022   Cellulitis of lower extremity 10/03/2022   Multinodular goiter 06/18/2022   Malnutrition of moderate degree 04/18/2022   Intractable vomiting with nausea 04/17/2022   DKA (diabetic ketoacidosis) (HCC) 03/04/2022   Nausea vomiting and diarrhea 12/25/2021   Diabetic gastroparesis (HCC) 10/09/2021   Drug-seeking behavior 09/21/2021   Essential hypertension 08/25/2021   Diabetes mellitus type 1 with  complications (HCC) 08/25/2021   GERD without esophagitis 08/25/2021   Hematemesis    Prolonged Q-T interval on ECG    Nausea and vomiting 07/20/2021   Anemia of chronic disease 06/06/2021   Dehydration 06/04/2021   Hypomagnesemia 04/21/2021   Ulcerated, foot, left, with fat layer exposed (HCC) 04/20/2021   Uncontrolled type 1 diabetes mellitus with hyperglycemia, with long-term current use of insulin (HCC) 12/18/2020   DM type 1 (diabetes mellitus, type 1) (HCC) 12/18/2020   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) 11/09/2020   Diabetic retinopathy of both eyes associated with type 1 diabetes mellitus (HCC) 09/24/2020   Diabetic gastroparesis associated with type 1 diabetes mellitus (HCC) 08/12/2020   Acute kidney injury superimposed on chronic kidney disease (HCC) 07/25/2020   Generalized abdominal pain 07/23/2020   GERD (gastroesophageal reflux disease) 07/23/2020   Diabetic nephropathy associated with type 1 diabetes mellitus (HCC) 06/27/2020   Sepsis (HCC) 05/27/2020   HLD (hyperlipidemia) 05/23/2020   Sinus tachycardia 05/09/2020   Anemia 04/20/2020   Depression with anxiety 07/05/2019   Diabetic polyneuropathy associated with type 1 diabetes mellitus (HCC) 09/24/2017   Gastroparesis    Goiter 10/05/2009   DM2 (diabetes mellitus, type 2) (HCC) 10/05/2009   Hypertension associated with diabetes (HCC)  10/05/2009    ONSET DATE: 04/07/23 referral  REFERRING DIAG: [phone] (ICD-10-CM) - S/P AKA (above knee amputation) bilateral (HCC)   THERAPY DIAG:  Muscle weakness (generalized)  Other symptoms and signs involving the musculoskeletal system  Abnormal posture  Other abnormalities of gait and mobility  Rationale for Evaluation and Treatment: Rehabilitation  SUBJECTIVE:                                                                                                                                                                                              SUBJECTIVE STATEMENT: Patient reports doing well- feeling well today. Agreeable to trial floor transfers today. Denies falls.   Pt accompanied by: friend  PERTINENT HISTORY: DM, diabetic gastroparesis, GERD, HTN, hyperthyroidism  PAIN:  Are you having pain? No  VITALS: There were no vitals filed for this visit.  PRECAUTIONS: Fall; B diabetic retinopathy  PATIENT GOALS: "to be able to walk again"                                                                                                                              TREATMENT -floor transfer   -6"-> 8"-> wc with CGA/MinA x2 trials   SENSATION and SKIN ISSUES:  Sensation [x]  Intact  []  Impaired []  Absent []  Hyposensate []  Hypersensate  []  Defensiveness  Location(s) of impairment:    Pressure Relief Method(s):  [x]  Lean side to side to offload (without risk of falling)  [x]   W/C push up (4+ times/hour for 15+ seconds) []  Stand up (without risk of falling)    []  Other: (Describe): Effective pressure relief method(s) above can be performed consistently throughout the day: [] Yes  []  No If not, Why?:  Skin Integrity Risk:       []  Low risk           [x]  Moderate risk            []  High risk  If high risk, explain:   Skin Issues/Skin Integrity  Current skin Issues  []  Yes [x]  No [x]  Intact  []   Red  area   []   Open area  []  Scar tissue  []  At risk from prolonged sitting  Where: ischium, coccyx, sacrum History of Skin Issues  [x]  Yes []  No Where : B LE  When: August Stage: resulted in amputation  Hx of skin flap surgeries  []  Yes [x]  No Where:  When:  Pain: []  Yes [x]  No   Pain Location(s):  Intensity scale: (0-10) : How does pain interfere with mobility and/or MRADLs? -    MAT EVALUATION:  Neuro-Muscular Status: (Tone, Reflexive, Responses, etc.)     [x]   Intact   []  Spasticity:  []  Hypotonicity  []  Fluctuating  []  Muscle Spasms  []  Poor Righting Reactions/Poor Equilibrium Reactions  []  Primal  Reflex(s):    Comments:            COMMENTS:    POSTURE:     Comments:  Pelvis Anterior/Posterior:  [x]  Neutral   []  Posterior  []  Anterior  []  Fixed - No movement []  Tendency away from neutral []  Flexible []  Self-correction []  External correction Obliquity (viewed from front)  [x]  WFL []  R Obliquity []  L Obliquity  []  Fixed - No movement []  Tendency away from neutral []  Flexible []  Self-correction []  External correction Rotation  [x]  WFL []  R anterior []  L anterior  []  Fixed - No movement []  Tendency away from neutral []  Flexible []  Self-correction []  External correction Tonal Influence Pelvis:  [x]  Normal []  Flaccid []  Low tone []  Spasticity []  Dystonia []  Pelvis thrust []  Other:    Trunk Anterior/Posterior:  []  WFL [x]  Thoracic kyphosis []  Lumbar lordosis  []  Fixed - No movement [x]  Tendency away from neutral [x]  Flexible [x]  Self-correction []  External correction  [x]  WFL []  Convex to left  []  Convex to right []  S-curve   []  C-curve []  Multiple curves []  Tendency away from neutral []  Flexible []  Self-correction []  External correction Rotation of shoulders and upper trunk:  [x]  Neutral []  Left-anterior []  Right- anterior []  Fixed- no movement []  Tendency away from neutral []  Flexible []  Self correction []  External correction Tonal influence Trunk:  [x]  Normal []  Flaccid []  Low tone []  Spasticity []  Dystonia []  Other:   Head & Neck  [x]  Functional []  Flexed    []  Extended []  Rotated right  []  Rotated left []  Laterally flexed right []  Laterally flexed left []  Cervical hyperextension   [x]  Good head control []  Adequate head control []  Limited head control []  Absent head control Describe tone/movement of head and neck:      Lower Extremity Measurements:   LE MMT:  MMT Right 05/21/2023 Left 05/21/2023  Hip flexion 5 5  Hip extension 5 5  Hip abduction 5 5  Hip adduction 5 5  Knee flexion    Knee extension     Ankle dorsiflexion    Ankle plantarflexion     (Blank rows = not tested)  Hip positions:  [x]  Neutral   []  Abducted   []  Adducted  []  Subluxed   []  Dislocated   []  Fixed   []  Tendency away from neutral []  Flexible []  Self-correction []  External correction   Hip Windswept:[x]  Neutral  []  Right    []  Left  []  Subluxed   []  Dislocated   []  Fixed   []  Tendency away from neutral []  Flexible []  Self-correction []  External correction    UE Measurements:  UPPER EXTREMITY MMT:  MMT Right 05/21/2023 Left 05/21/2023  Shoulder flexion 5 5  Shoulder abduction 5 5  Shoulder adduction    Elbow flexion 5 5  Elbow extension 5 5  Wrist flexion    Wrist extension    Pinch strength    Grip strength WFL WFL  (Blank rows = not tested)  Shoulder Posture:  Right Tendency towards Left  []   Functional []    [x]   Elevation [x]    []   Depression []    [x]   Protraction [x]    []   Retraction []    [x]   Internal rotation [x]    []   External rotation []    []   Subluxed []     UE Tone: [x]  Normal []  Flaccid []  Low tone []  Spasticity  []  Dystonia []  Other:   Wrist/Hand: Handedness: [x]  Right   []  Left   []  NA: Comments:  Right  Left  [x]   WNL [x]    []   Limitations []    []   Contractures []    []   Fisting []    []   Tremors []    []   Weak grasp []    []   Poor dexterity []    []   Hand movement non functional []    []   Paralysis []      PATIENT EDUCATION: Education details: Continue HEP, fall precautions, plan for wc eval tomorrow Person educated: Patient and friend Education method: Explanation and Demonstration Education comprehension: verbalized understanding and needs further education  HOME EXERCISE PROGRAM: -practice donning/doffing prosthetic Access Code: [email or web address] URL: [email or web address] Date: 04/20/2023 Prepared by: Merry Lofty  Exercises - Prone Hip Extension with Residual Limb (BKA)  - 1 x daily - 7 x weekly - 3 sets - 10 reps - Sidelying Hip Abduction  (AKA)  - 1 x daily - 7 x weekly - 3 sets - 10 reps - Supine Piriformis Stretch with Residual Limb (AKA)  - 1 x daily - 7 x weekly - 3 sets - 10 reps  Do each exercise 1-2  times per day Do each exercise 10 repetitions Hold each exercise for 2 seconds to feel your location  AT SINK FIND YOUR MIDLINE POSITION AND PLACE FEET EQUAL DISTANCE FROM THE MIDLINE.  Try to find this position when standing still for activities.   USE TAPE ON FLOOR TO MARK THE MIDLINE POSITION. You also should try to feel with your limb pressure in socket.  You are trying to feel with limb what you used to feel with the bottom of your foot.  Side to Side Shift: Moving your hips only (not shoulders): move weight onto your left leg, HOLD/FEEL.  Move back to equal weight on each leg, HOLD/FEEL. Move weight onto your right leg, HOLD/FEEL. Move back to equal weight on each leg, HOLD/FEEL. Repeat.  Start with both hands on sink, progress to right hand only, then no hands.  Front to Back Shift: Moving your hips only (not shoulders): move your weight forward onto your toes, HOLD/FEEL. Move your weight back to equal Flat Foot on both legs, HOLD/FEEL. Move your weight back onto your heels, HOLD/FEEL. Move your weight back to equal on both legs, HOLD/FEEL. Repeat.   tart with both hands on sink, progress to right hand only, then no hands. Moving Cones / Cups: With equal weight on each leg: Hold on with one hand the first time, then progress to no hand supports. Move cups from one side of sink to the other. Place cups ~2" out of your reach, progress to 10" beyond reach.  Place one hand in middle of sink and reach with other hand. Do both arms.  Then hover one hand  and move cups with other hand. -just use a crossbody reach to end of safe weight shift ROM Overhead/Upward Reaching: alternated reaching up to top cabinets or ceiling if no cabinets present. Keep equal weight on each leg. Start with one hand support on counter while other hand  reaches and progress to no hand support with reaching.  ace one hand in middle of sink and reach with other hand. Do both arms.  Then hover one hand and move cups with other hand.  5.   Looking Over Shoulders: With equal weight on each leg: alternate turning to look over your shoulders with one hand support on counter as needed.  Start with head motions only to look in front of shoulder, then even with shoulder and progress to looking behind you. To look to side, move head /eyes, then shoulder on side looking pulls back, shift more weight to side looking and pull hip back. ace one hand in middle of sink and let go with other hand so your shoulder can pull back. Switch hands to look other way.   Then hover one hand and move cups with other hand.  6.  Stepping with leg that is not amputated:  Move items under cabinet out of your way. Shift your hips/pelvis so weight on prosthesis. SLOWLY step other leg so front of foot is in cabinet. Then step back to floor. - just hike the hip and place the foot back in the starting spot  GOALS: Goals reviewed with patient? Yes  SHORT TERM GOALS: Target date: 05/15/23  Pt will be independent with initial HEP for improved gait mechanics and balance  Baseline: to be updated, reports she has not been doing at home doesn't trust CNA Goal status: NOT MET  2.  Patient will ambulate at least 61ft with LRAD and no more than MinA to demonstrate improved mobility  Baseline: 45ft // bars + MinA, 10 feet with 2WW + minA Goal status: NOT met  3.  Patient will complete sit <> stand with LRAD and no more than CGA for improved independence with transfers Baseline: MinA, required minA in today's session  Goal status: NOT met - required minA due to posterior instability  4.  Patient will complete floor transfer with assist as initial step of fall recovery  Baseline: unable to complete on eval; MinA Goal status: MET   LONG TERM GOALS: Target date: 07/10/23  Pt will be  independent with final HEP for improved gait mechanics and balance  Baseline: to be updated Goal status: INITIAL  Patient will ambulate at least 74ft with LRAD, ModI to demonstrate improved mobility  Baseline: 64ft // bars + MinA Goal status: INITIAL  Patient will complete sit <> stand with LRAD. SBA for improved independence with transfers Baseline: MinA Goal status: REVISED  Patient will complete floor transfer without assist as initial step of fall recovery  Baseline: unable to complete on eval Goal status: INITIAL  5. Patient will don/doff prosthetics independently   Baseline: MaxA  Goal status: INITIAL  ASSESSMENT:  CLINICAL IMPRESSION: Patient seen for skilled PT session with emphasis on floor recovery practice and education and beginning custom wc eval (to be completed tomorrow). She was ultimately able to complete a floor transfer in a step-wise fashion progressing from floor -> 6" step -> 8" step and then ultimately into her wc using a posterior approach. She did require MinA, but shortly likely won't need much more than supervision. Also discussed that it may be in patients best  interest to have stubbies lengthened to a more functional height for ease of sit <> stand and reaching counter-height surfaces. PT to contact prosthetist re: this. Continue POC.   OBJECTIVE IMPAIRMENTS: Abnormal gait, decreased activity tolerance, decreased balance, decreased endurance, decreased knowledge of condition, decreased knowledge of use of DME, decreased strength, impaired vision/preception, and prosthetic dependency .   ACTIVITY LIMITATIONS: carrying, lifting, standing, squatting, transfers, hygiene/grooming, locomotion level, and caring for others  PARTICIPATION LIMITATIONS: meal prep, cleaning, interpersonal relationship, driving, shopping, community activity, and occupation  PERSONAL FACTORS: Age, Behavior pattern, Past/current experiences, Social background, Time since onset of  injury/illness/exacerbation, Transportation, and 3+ comorbidities: see above  are also affecting patient's functional outcome.   REHAB POTENTIAL: Good  CLINICAL DECISION MAKING: Stable/uncomplicated  EVALUATION COMPLEXITY: Low  PLAN:  PT FREQUENCY: 2x/week  PT DURATION: 12 weeks  PLANNED INTERVENTIONS: 97164- PT Re-evaluation, 97110-Therapeutic exercises, 97530- Therapeutic activity, 97112- Neuromuscular re-education, 97535- Self Care, 16109- Manual therapy, [phone]- Gait training, [phone]- Prosthetic training, [phone]- Aquatic Therapy, Patient/Family education, Balance training, Stair training, Scar mobilization, Vestibular training, Visual/preceptual remediation/compensation, DME instructions, and Wheelchair mobility training  PLAN FOR NEXT SESSION: Gait, balance, floor transfers  how is modified sink HEP?, balance recovery in standing/static stability w/ dec UE reliance   Westley Foots, PT Westley Foots, PT, DPT, CBIS  05/21/2023, 2:59 PM

## 2023-05-22 ENCOUNTER — Ambulatory Visit

## 2023-05-22 DIAGNOSIS — R293 Abnormal posture: Secondary | ICD-10-CM

## 2023-05-22 DIAGNOSIS — R2689 Other abnormalities of gait and mobility: Secondary | ICD-10-CM

## 2023-05-22 DIAGNOSIS — M6281 Muscle weakness (generalized): Secondary | ICD-10-CM | POA: Diagnosis not present

## 2023-05-22 DIAGNOSIS — R29898 Other symptoms and signs involving the musculoskeletal system: Secondary | ICD-10-CM

## 2023-05-22 NOTE — Therapy (Signed)
 OUTPATIENT PHYSICAL THERAPY WHEELCHAIR EVALUATION   Patient Name: Kelli Hudson MRN: [id number] DOB:12-11-1990, 33 y.o., female Today's Date: 05/22/2023  END OF SESSION:  PT End of Session - 05/22/23 1449     Visit Number 10    Number of Visits 25    Date for PT Re-Evaluation 07/10/23    Authorization Type Medicare/Medicaid    PT Start Time 1445    PT Stop Time 1521    PT Time Calculation (min) 36 min    Activity Tolerance Patient tolerated treatment well    Behavior During Therapy [facility] for tasks assessed/performed             Past Medical History:  Diagnosis Date   Acute H. pylori gastric ulcer    Coffee ground emesis    Diabetes mellitus (HCC)    Diabetic gastroparesis (HCC)    DKA (diabetic ketoacidosis) (HCC) 02/24/2021   Gastroparesis    GERD (gastroesophageal reflux disease)    Hypertension    Hyperthyroidism    Intractable nausea and vomiting 04/20/2021   Normocytic anemia 04/20/2020   Prolonged Q-T interval on ECG    Past Surgical History:  Procedure Laterality Date   AMPUTATION Bilateral 10/24/2022   Procedure: BILATERAL BELOW KNEE AMPUTATION;  Surgeon: Nadara Mustard, MD;  Location: [facility] OR;  Service: Orthopedics;  Laterality: Bilateral;   AMPUTATION Bilateral 10/08/2022   Procedure: BILATERAL LEG DEBRIDEMENT;  Surgeon: Nadara Mustard, MD;  Location: [facility];  Service: Orthopedics;  Laterality: Bilateral;   AMPUTATION Bilateral 11/14/2022   Procedure: BILATERAL ABOVE KNEE AMPUTATION;  Surgeon: Nadara Mustard, MD;  Location: [facility] OR;  Service: Orthopedics;  Laterality: Bilateral;   AMPUTATION TOE Left 03/10/2018   Procedure: AMPUTATION FIFTH TOE;  Surgeon: Vivi Barrack, DPM;  Location: [facility];  Service: Podiatry;  Laterality: Left;   APPLICATION OF WOUND VAC Bilateral 10/12/2022   Procedure: APPLICATION OF WOUND VAC BILATERAL LEGS;  Surgeon: Myrene Galas, MD;  Location: [facility];  Service: Orthopedics;  Laterality: Bilateral;    APPLICATION OF WOUND VAC Bilateral 10/24/2022   Procedure: APPLICATION OF WOUND VAC;  Surgeon: Nadara Mustard, MD;  Location: [facility];  Service: Orthopedics;  Laterality: Bilateral;   BIOPSY  01/28/2020   Procedure: BIOPSY;  Surgeon: Kathi Der, MD;  Location: [facility];  Service: Gastroenterology;;   BOTOX INJECTION  08/26/2021   Procedure: BOTOX INJECTION;  Surgeon: Sherrilyn Rist, MD;  Location: [facility];  Service: Gastroenterology;;   ESOPHAGOGASTRODUODENOSCOPY N/A 01/28/2020   Procedure: ESOPHAGOGASTRODUODENOSCOPY (EGD);  Surgeon: Kathi Der, MD;  Location: [facility];  Service: Gastroenterology;  Laterality: N/A;   ESOPHAGOGASTRODUODENOSCOPY (EGD) WITH PROPOFOL Left 09/08/2015   Procedure: ESOPHAGOGASTRODUODENOSCOPY (EGD) WITH PROPOFOL;  Surgeon: Willis Modena, MD;  Location: [facility];  Service: Endoscopy;  Laterality: Left;   ESOPHAGOGASTRODUODENOSCOPY (EGD) WITH PROPOFOL N/A 08/26/2021   Procedure: ESOPHAGOGASTRODUODENOSCOPY (EGD) WITH PROPOFOL;  Surgeon: Sherrilyn Rist, MD;  Location: [facility];  Service: Gastroenterology;  Laterality: N/A;   GASTRIC STIMULATOR IMPLANT SURGERY  06/2022   at [facility]   I & D EXTREMITY Bilateral 10/12/2022   Procedure: IRRIGATION AND  DEBRIDEMENT  BILATERAL LEGS;  Surgeon: Myrene Galas, MD;  Location: [facility] OR;  Service: Orthopedics;  Laterality: Bilateral;   I & D EXTREMITY Right 10/13/2022   Procedure: RIGHT THIGH WOUND VAC EXCHANGE;  Surgeon: Roby Lofts, MD;  Location: [facility];  Service: Orthopedics;  Laterality: Right;   I & D EXTREMITY Bilateral 10/17/2022  Procedure: BILATERAL THIGH AND LEG DEBRIDEMENT, PARTIAL BILATERAL FOOT AMPUTATION;  Surgeon: Nadara Mustard, MD;  Location: [facility];  Service: Orthopedics;  Laterality: Bilateral;   I & D EXTREMITY Bilateral 11/05/2022   Procedure: BILATERAL THIGH DEBRIDEMENT;  Surgeon: Nadara Mustard, MD;  Location: [facility] OR;  Service: Orthopedics;  Laterality: Bilateral;   I & D  EXTREMITY Bilateral 11/07/2022   Procedure: BILATERAL THIGH AND LEG DEBRIDEMENT;  Surgeon: Nadara Mustard, MD;  Location: [facility] OR;  Service: Orthopedics;  Laterality: Bilateral;   I & D EXTREMITY Bilateral 11/12/2022   Procedure: BILATERAL LEG DEBRIDEMENTS;  Surgeon: Nadara Mustard, MD;  Location: [facility] OR;  Service: Orthopedics;  Laterality: Bilateral;   IRRIGATION AND DEBRIDEMENT FOOT Bilateral 10/05/2022   Procedure: IRRIGATION AND DEBRIDEMENT FOOT;  Surgeon: [name], DPM;  Location: [facility];  Service: Orthopedics/Podiatry;  Laterality: Bilateral;   WISDOM TOOTH EXTRACTION     Patient Active Problem List   Diagnosis Date Noted   Influenza A 04/05/2023   Elevated lipase 04/05/2023   Esophagitis 03/24/2023   SIRS (systemic inflammatory response syndrome) (HCC) 03/02/2023   Fever 03/02/2023   Metabolic acidosis 03/02/2023   Hypertensive urgency 02/21/2023   Chronic pain 02/21/2023   Abscess of thigh 11/28/2022   C. 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   Hypokalemia 11/27/2022   Effusion, left knee 11/05/2022   MDD (major depressive disorder), recurrent episode, moderate (HCC) 10/16/2022   Necrotizing fasciitis of pelvic region and thigh (HCC) 10/08/2022   Necrotizing fasciitis of lower leg (HCC) 10/08/2022   Necrotizing fasciitis (HCC) 10/08/2022   Sepsis due to cellulitis (HCC) 10/03/2022   Cellulitis of lower extremity 10/03/2022   Multinodular goiter 06/18/2022   Malnutrition of moderate degree 04/18/2022   Intractable vomiting with nausea 04/17/2022   DKA (diabetic ketoacidosis) (HCC) 03/04/2022   Nausea vomiting and diarrhea 12/25/2021   Diabetic gastroparesis (HCC) 10/09/2021   Drug-seeking behavior 09/21/2021   Essential hypertension 08/25/2021   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) 08/25/2021   GERD without esophagitis 08/25/2021   Hematemesis    Prolonged Q-T interval on ECG    Nausea and vomiting  07/20/2021   Anemia of chronic disease 06/06/2021   Dehydration 06/04/2021   Hypomagnesemia 04/21/2021   Ulcerated, foot, left, with fat layer exposed (HCC) 04/20/2021   Uncontrolled type 1 diabetes mellitus with hyperglycemia, with long-term current use of insulin (HCC) 12/18/2020   DM type 1 (diabetes mellitus, type 1) (HCC) 12/18/2020   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) 11/09/2020   Diabetic retinopathy of both eyes associated with type 1 diabetes mellitus (HCC) 09/24/2020   Diabetic gastroparesis associated with type 1 diabetes mellitus (HCC) 08/12/2020   Acute kidney injury superimposed on chronic kidney disease (HCC) 07/25/2020   Generalized abdominal pain 07/23/2020   GERD (gastroesophageal reflux disease) 07/23/2020   Diabetic nephropathy associated with type 1 diabetes mellitus (HCC) 06/27/2020   Sepsis (HCC) 05/27/2020   HLD (hyperlipidemia) 05/23/2020   Sinus tachycardia 05/09/2020   Anemia 04/20/2020   Depression with anxiety 07/05/2019   Diabetic polyneuropathy associated with type 1 diabetes mellitus (HCC) 09/24/2017   Gastroparesis    Goiter 10/05/2009   DM2 (diabetes mellitus, type 2) (HCC) 10/05/2009   Hypertension associated with diabetes (HCC) 10/05/2009    PCP: Cristino Martes, NP  REFERRING PROVIDER: Maebelle Munroe, MD  THERAPY DIAG:  Muscle weakness (generalized)  Other symptoms and signs involving the  musculoskeletal system  Abnormal posture  Other abnormalities of gait and mobility  Rationale for Evaluation and Treatment Rehabilitation  SUBJECTIVE:                                                                                                                                                                                           SUBJECTIVE STATEMENT: Pt presents for wheelchair evaluation. She is currently in a standard K4 wheelchair.   PRECAUTIONS: Fall and Other: low vision  WEIGHT BEARING RESTRICTIONS No  PLOF:   Requires assistive device for independence  PATIENT GOALS: to get a better wheelchair   MEDICAL HISTORY:  Primary diagnosis onset: August 2024     Medical Diagnosis with ICD-10 code: Z89.611; Z89.612- S/P AKA- bilateral   [] Progressive disease  Relevant future surgeries:     Height: 5' Weight: 130lbs Explain recent changes or trends in weight:      History:  Past Medical History:  Diagnosis Date   Acute H. pylori gastric ulcer    Coffee ground emesis    Diabetes mellitus (HCC)    Diabetic gastroparesis (HCC)    DKA (diabetic ketoacidosis) (HCC) 02/24/2021   Gastroparesis    GERD (gastroesophageal reflux disease)    Hypertension    Hyperthyroidism    Intractable nausea and vomiting 04/20/2021   Normocytic anemia 04/20/2020   Prolonged Q-T interval on ECG        Cardio Status:  Functional Limitations:   [x] Intact  []  Impaired      Respiratory Status:  Functional Limitations:   [x] Intact  [] Impaired   [] SOB [] COPD [] O2 Dependent ______LPM  [] Ventilator Dependent  Resp equip:                                                     Objective Measure(s):   Orthotics:   [x] Amputee:      Bilateral above knee                                                       [x] Prosthesis: Bilateral    HOME ENVIRONMENT:  [] House [] Condo/town home [x] Apartment [] Asst living [] LTCF         [] Own  [x] Rent   [x] Lives alone [] Lives with others -  Hours without assistance: flucuates- does have intermittent nursing support  [x] Home is accessible to patient                                 Storage of wheelchair:  [x] In home   [] Other Comments:       COMMUNITY :  TRANSPORTATION:  [x] Car [] Van [x] Public Transportation [] Adapted w/c Lift []  Ambulance [] Other:                     [] Sits in wheelchair during transport   Where is w/c stored during transport? trunk [x] Tie Downs  []  [employer]  r   [] Self-Driver       Drive while in  Biomedical scientist [] yes [x] no   Employment and/or  school:  Specific requirements pertaining to mobility        Other:  COMMUNICATION:  Verbal Communication  [x] WFL [] receptive [x] WFL [] expressive [] Understandable  [] Difficult to understand  [] non-communicative  Primary Language:_____english_________ 2nd:_____________  Communication provided by:[x] Patient [] Family [] Caregiver [] Translator   [] Uses an Paramedic device     Manufacturer/Model :    MOBILITY/BALANCE:  Sitting Balance  Standing Balance  Transfers  Ambulation   [x] WFL      [] WFL  [x] Independent  []  Independent   [] Uses UE for balance in sitting Comments:  [] Uses UE/device for stability Comments:  []  Min assist  []  Ambulates independently with       device:___________________      []  Mod assist  []  Able to ambulate ______ feet        safely/functionally/independently   []  Min assist  []  Min assist  []  Max assist  []  Non-functional ambulator         History/High risk of falls   []  Mod assist  []  Mod assist  []  Dependent  [x]  Unable to ambulate   []  Max  assist  []  Max assist  Transfer method:[] 1 person [] 2 person [] sliding board [x] squat pivot [] stand pivot [] mechanical patient lift  [] other:   []  Unable  [x]  Unable    Fall History: # of falls in the past 6 months? 1; fell off her bed # of "near" falls in the past 6 months? 0    CURRENT SEATING / MOBILITY:  Current Mobility Device: [] None [] Cane/Walker [x] Manual [] Dependent [] Dependent w/ Tilt rScooter  [] Power (type of control):   Manufacturer: K4 Model:  Serial #:   Size:  Color:  Age:   Purchased by whom:   Current condition of mobility base:    Current seating system:                                                                       Age of seating system:    Describe posture in present seating system:    Is the current mobility meeting medical necessity?:  [] Yes [] No Describe:    Ability to complete Mobility-Related Activities of Daily Living (MRADL's) with Current Mobility Device:   Move room to  room  [x] Independent  [] Min [] Mod [] Max assist  [] Unable  Comments:   Meal prep  [] Independent  [] Min [x] Mod [] Max assist  [] Unable    Feeding  [x] Independent  [] Min [] Mod [] Max assist  []   Unable    Bathing  [] Independent  [] Min [x] Mod [] Max assist  [] Unable    Grooming  [] Independent  [] Min [x] Mod [] Max assist  [] Unable    UE dressing  [] Independent  [] Min [x] Mod [] Max assist  [] Unable    LE dressing  [] Independent   [] Min [x] Mod [] Max assist  [] Unable    Toileting  [] Independent  [] Min [x] Mod [] Max assist  [] Unable    Bowel Mgt: [x]  Continent []  Incontinent []  Accidents []  Diapers []  Colostomy []  Bowel Program:  Bladder Mgt: [x]  Continent []  Incontinent []  Accidents []  Diapers []  Urinal []  Intermittent Cath []  Indwelling Cath []  Supra-pubic Cath     Current Mobility Equipment Trialed/ Ruled Out:    Does not meet mobility needs due to:    Mark all boxes that indicate inability to use the specific equipment listed     Meets needs for safe  independent functional  ambulation  / mobility    Risk of  Falling or History of Falls    Enviromental limitations      Cognition    Safety concerns with  physical ability    Decreased / limitations endurance  & strength     Decreased / limitations  motor skills  & coordination    Pain    Pace /  Speed    Cardiac and/or  respiratory condition    Contra - indicated by diagnosis   Cane/Crutches  []   [x]   []   []   [x]   [x]   [x]   []   [x]   []   []    Walker / Rollator  []  NA   []   [x]   []   []   [x]   [x]   [x]   []   [x]   []   []     Manual Wheelchair [id number]:  []  NA  [x]   []   []   []   []   []   []   []   []   []   []    Manual W/C (K0005) with power assist  []  NA  []   []   []   []   []   []   []   []   []   []   []    Scooter  []  NA  []   []   []   []   []   []   []   []   []   []   []    Power Wheelchair: standard joystick  []  NA  []   []   []   []   []   []   []   []   []   []   []    Power Wheelchair: alternative controls  []  NA  []   []   []   []   []   []   []   []   []   []   []     Summary:  The least costly alternative for independent functional mobility was found to be:    []  Crutch/Cane  []  Walker [x]  Manual w/c  []  Manual w/c with power assist   []  Scooter   []  Power w/c std joystick   []  Power w/c alternative control        []  Requires dependent care mobility device   Cabin crew for [employer] skills are adequate for safe mobility equipment operation  [x]   Yes []   No  Patient is willing and motivated to use recommended mobility equipment  [x]   Yes []   No       []  Patient is unable to safely operate mobility equipment independently and requires dependent care equipment Comments:           SENSATION and SKIN ISSUES:  Sensation [x]  Intact  []  Impaired []  Absent []  Hyposensate []  Hypersensate  []  Defensiveness  Location(s) of impairment:      Pressure Relief Method(s):  [x]  Lean side to side to offload (without risk of falling)  [x]   W/C push up (4+ times/hour for 15+ seconds) []  Stand up (without risk of falling)    []  Other: (Describe): Effective pressure relief method(s) above can be performed consistently throughout the day: [] Yes  []  No If not, Why?:   Skin Integrity Risk:       []  Low risk           [x]  Moderate risk            []  High risk  If high risk, explain:   Skin Issues/Skin Integrity  Current skin Issues  []  Yes [x]  No [x]  Intact  []   Red area   []   Open area  []  Scar tissue  []  At risk from prolonged sitting  Where: ischium, coccyx, sacrum History of Skin Issues  [x]  Yes []  No Where : B LE  When: August Stage: resulted in amputation  Hx of skin flap surgeries  []  Yes [x]  No Where:  When:  Pain: []  Yes [x]  No   Pain Location(s):  Intensity scale: (0-10) : How does pain interfere with mobility and/or MRADLs? -     MAT EVALUATION:     Neuro-Muscular Status: (Tone, Reflexive, Responses, etc.)     [x]   Intact   []  Spasticity:  []  Hypotonicity  []  Fluctuating  []  Muscle Spasms  []  Poor  Righting Reactions/Poor Equilibrium Reactions  []  Primal Reflex(s):     Comments:             COMMENTS:     POSTURE:         Comments:  Pelvis Anterior/Posterior:   [x]  Neutral   []  Posterior  []  Anterior   []  Fixed - No movement []  Tendency away from neutral []  Flexible []  Self-correction []  External correction Obliquity (viewed from front)   [x]  WFL []  R Obliquity []  L Obliquity   []  Fixed - No movement []  Tendency away from neutral []  Flexible []  Self-correction []  External correction Rotation   [x]  WFL []  R anterior []  L anterior   []  Fixed - No movement []  Tendency away from neutral []  Flexible []  Self-correction []  External correction Tonal Influence Pelvis:   [x]  Normal []  Flaccid []  Low tone []  Spasticity []  Dystonia []  Pelvis thrust []  Other:     Trunk Anterior/Posterior:   []  WFL [x]  Thoracic kyphosis []  Lumbar lordosis   []  Fixed - No movement [x]  Tendency away from neutral [x]  Flexible [x]  Self-correction []  External correction   [x]  WFL []  Convex to left  []  Convex to right []  S-curve   []  C-curve []  Multiple curves []  Tendency away from neutral []  Flexible []  Self-correction []  External correction Rotation of shoulders and upper trunk:   [x]  Neutral []  Left-anterior []  Right- anterior []  Fixed- no movement []  Tendency away from neutral []  Flexible []  Self correction []  External correction Tonal influence Trunk:   [x]  Normal []  Flaccid []  Low tone []  Spasticity []  Dystonia []  Other:   Head & Neck   [x]  Functional []  Flexed    []  Extended []  Rotated right  []  Rotated left []  Laterally flexed right []  Laterally flexed left []  Cervical hyperextension     [x]  Good head control []  Adequate head control []  Limited head control []  Absent head control  Describe tone/movement of head and neck:     Lower Extremity Measurements: LE MMT:   MMT Right 05/21/2023 Left 05/21/2023  Hip flexion 5 5  Hip extension 5 5   Hip abduction 5 5  Hip adduction 5 5  Knee flexion      Knee extension      Ankle dorsiflexion      Ankle plantarflexion       (Blank rows = not tested)   Hip positions:  [x]  Neutral   []  Abducted   []  Adducted  []  Subluxed   []  Dislocated   []  Fixed   []  Tendency away from neutral []  Flexible []  Self-correction []  External correction     Hip Windswept:[x]  Neutral  []  Right    []  Left  []  Subluxed   []  Dislocated   []  Fixed   []  Tendency away from neutral []  Flexible []  Self-correction []  External correction   UE Measurements:  UPPER EXTREMITY MMT:   MMT Right 05/21/2023 Left 05/21/2023  Shoulder flexion 5 5  Shoulder abduction 5 5  Shoulder adduction      Elbow flexion 5 5  Elbow extension 5 5  Wrist flexion      Wrist extension      Pinch strength      Grip strength WFL WFL  (Blank rows = not tested)   Shoulder Posture:   Right Tendency towards Left  []   Functional []    [x]   Elevation [x]    []   Depression []    [x]   Protraction [x]    []   Retraction []    [x]   Internal rotation [x]    []   External rotation []    []   Subluxed []      UE Tone: [x]  Normal []  Flaccid []  Low tone []  Spasticity  []  Dystonia []  Other:    Wrist/Hand: Handedness: [x]  Right   []  Left   []  NA: Comments:  Right   Left  [x]   WNL [x]    []   Limitations []    []   Contractures []    []   Fisting []    []   Tremors []    []   Weak grasp []    []   Poor dexterity []    []   Hand movement non functional []    []   Paralysis []     MOBILITY BASE RECOMMENDATIONS and JUSTIFICATION:  MOBILITY BASE  JUSTIFICATION   Manufacturer:   [employer] Model:                             A6 Color: electric purple Seat Width:  17" Seat Depth 19"   [x]  Manual mobility base (continue below)   []  Scooter/POV  []  Power mobility base   Number of hours per day spent in above selected mobility base: 16 hours  Typical daily mobility base use Schedule: Transfers into wheelchair to complete any and all  MRADLs   [x]  is not a safe, functional ambulator  [x]  limitation prevents from completing a MRADL(s) within a reasonable time frame    [x]  limitation places at high risk of morbidity or mortality secondary to  the attempts to perform a    MRADL(s)  [x]  limitation prevents accomplishing a MRADL(s) entirely  [x]  provide independent mobility  [x]  equipment is a lifetime medical need  [x]  walker or cane inadequate  [x]  any type manual wheelchair      inadequate  [x]  scooter/POV inadequate      []  requires dependent mobility  MANUAL MOBILITY      []  Standard manual wheelchair  K0001      Arm:    []  both []  right  []  left      Foot:   []  both []  right   []  left  []  self-propels wheelchair  []  will use on regular basis  []  chair fits throughout home  []  willing and motivated to use  []  propels with assistance     []  dependent use   []  Standard hemi-manual wheelchair  K0002      Arm:    []  both []  right  []  left      Foot:   []  both []  right   []  left  []  lower seat height required to foot propel  []  short stature  []  self-propels wheelchair  []  will use on regular basis  []  chair fits throughout home  []  willing and motivated to use   []  propels with assistance  []  dependent use   []  Lightweight manual wheelchair  K0003      Arm:    []  both []  right  []  left      Foot:   []  both  []  right  []  left                   []  hemi height required  []  medical condition and weight of  wheelchair affect ability to self      propel standard manual wheelchair in the residence  []  can and does self-propel (marginal propulsion skills)  []  daily use _________hours  []  chair fits throughout home  []  willing and motivated to use  []  lower seat height required to foot propel  []  short stature   []  High strength lightweight manual  wheelchair (Breezy Ultra 4)  K0004     Arm:    []  both []  right  []  left     Foot:   []  both []  right   []  left                                                                   []  hemi height required []  medical condition and weight of wheelchair affect ability to self propel while engaging in frequent MRADL(s) that cannot be performed in a standard or lightweight manual wheelchair  []  daily use _________hours  []  chair fits throughout home  []  willing and motivated to use  []  prevent repetitive use injuries   []  lower seat height required to foot propel  []  short stature    []  Ultra-lightweight manual wheelchair  K0005     Arm:    [x]  both []  right  []  left     Foot:   []  both []  right  []  left       []  hemi height required  []  heavy duty    Front seat to floor __17.5___ inches      Rear seat to floor 16.3/4_____ inches      Back height __15___ inches     Back angle ______ degrees      Front angle _____ degrees  [x]   full-time manual wheelchair user  []  Requires individualized fitting and optimal adjustments for multiple features that include adjustable axle configuration, fully adjustable center of  gravity, wheel camber, seat and back angle, angle of seat slope, which cannot be accommodated by a K0001 through K0004 manual wheelchair  [x]  prevent repetitive use injuries  [x]  daily use___16______hours   [x]  user has high activity patterns that frequently require  them  to go out into the community for the purpose of independently accomplishing high level MRADL activities. Examples of these might include a combination of; shopping, work, school, Photographer, childcare, independently loading and unloading from a vehicle etc.  []  lower seat height required to foot propel  []  short stature  []  heavy duty -  weight over 250lbs   []  Current chair is a K0005   manufacture:___________________  model:_________________  serial#____________________  age:_________    []  First time Z6109 user (complete trial)  K0004 time and # of strokes to propel 30 feet: ________seconds _________strokes  U0454 time and # of strokes to propel 30 feet:  ________seconds _________strokes  What was the result of the trial between the K0004 and K0005 manual wheelchair? ___    What features of the K0005 w/c are needed as compared to the K0004 base? Why?___    []  adjustable seat and back angle changes the angle of seat slope of the frame to attain a gravity assisted position for efficient propulsion and proper weight distribution along the frame     []  the front of the wheelchair will be configured higher than the back of the chair to allow gravity to assist the user with postural stability  []  the center of the wheel will be positioned for stability, safety and efficient propulsion  []  adjustable axle allows for vertical, horizontal, camber and overall width changes  throughout the wheels for adjustment of the client's exact needs and abilities.   []  adjustable axle increases the stability and function of the chair allowing for adjustment of the center of gravity.   []  accommodates the client's anatomical position in the chair maximizing independence in mobility and maneuverability in all environments.   []  create a minimal fixed tilt-in space to assist in positioning.   []  Describe users full-time manual wheelchair activity patterns:___    []  Power assist Comments:  []  prevent repetitive use injuries  []  repetitive strain injury present in    shoulder girdle    []  shoulder pain is (> or =) to 7/10     during manual propulsion       Current Pain _____/10  []  requires conservation of energy to participate in MRADL(s) runable to propel up ramps or curbs using manual wheelchair  []  been K0005 user greater than one year  []  user unwilling to use power      wheelchair (reason): []  less expensive option to power   wheelchair   []  rim activated power assist -      decreased strength   []  Heavy duty manual wheelchair       K0006     Arm:    []  both []  right  []  left     Foot:   []  both []  right  []  left     []  hemi height required    []   Dependent base  []  user exceeds 250lbs  []  non-functional ambulator    []  extreme spasticity  []  over active movement   []  broken frame/hx of repeated     repairs  []  able to self-propel in residence       []  lower seat to floor height required  []  unable to self-propel in residence   []   Extra heavy duty manual wheelchair  K0007     Arm:    []  both []  right  []  left     Foot:   []  both []  right  []  left     []  hemi height required  []  Dependent base  []  user exceeds 300lbs  []  non-functional ambulator    []  able to self-propel in residence   []  lower seat to floor height required  []  unable to self-propel in residence     []  Manual wheelchair with tilt [phone]      (Manual "Tilt-n-Space")  []  patient is dependent for transfers  []  patient requires frequent       positioning for pressure relief   []  patient requires frequent      positioning for poor/absent trunk control        []  Stroller Base  []  infant/child   []  unable to propel manual      wheelchair  []  allows for growth  []  non-functional ambulator  []  non-functional UE  []  independent mobility is not a goal at this time    MANUAL FRAME OPTIONS      Push handles  []  extended   []  angle adjustable   [x]  standard  [x]  caregiver access  [x]  caregiver assist    []  allows "hooking" to enable      increased ability to perform ADLs or maintain balance   []  Angle Adjustable Back  []  postural control  []  control of tone/spasticity  []  accommodation of range of motion  []  UE functional control  []  accommodation for seating system    Rear wheel placement  []  std/fixed  [x] fully adjustableramputee   [x]  camber ___3_____degree  [x]  removable rear wheel  []  non-removable rear wheel  Wheel size __24"_____  Wheel style____spoke___________________  [x]  improved UE access to wheels  [x]  increase propulsion ability  [x]  improved stability  [x]  changing angle in space for      improvement of postural stability  [x]  remove for  transport    []  allow for seating system to fit on  base  []  amputee placement  []  1-arm drive access   r R  r L  []  enable propulsion of manual       wheelchair with one arm    []  amputee placement   Wheel rims/ Hand rims  [x]  Standard    []  Specialized-____ [x]  provide ability to propel manual   []  increase self-propulsion with hand wheelchair weakness/decreased grasp     []  Spoke protector/guard   []  prevent hands from getting caught in spokes   Tires:  [x]  pneumatic  [x]  flat free inserts  []  solid  Style:  [x]  decrease roll resistance              [x]  prevent frequent flats  [x]  increase shock absorbency  [x]  decrease maintenance   [x]  decrease pain from road shock    [x]  decrease spasms from road shock    Wheel Locks:    [x]  push []  pull []  scissor  [x]  lock wheels for transfers  [x]  lock wheels from rolling   Brake/wheel lock extension:  []  R  []  L  []  allow user to operate wheel locks due to decreased reach or strength   Caster housing:  Caster size:                      Style:                                          []   suspension fork  []  maneuverability   []  stability of wheelchair   []  durability  []  maintenance  []  angle adjustment for posture  []  allow for feet to come under        wheelchair base  []  allows change in seat to floor  height   []  increase shock absorbency  []  decrease pain from road shock  []  decrease spasms from road    shock   []  Side guards  []  prevent clothing getting caught in wheel or becoming soiled   [] provide hip and pelvic stability  []  eliminates contact between body and wheels  []  limit hand contact with wheels   [x]  Anti-tippers      [x]  prevent wheelchair from tipping    backward  [x]  assist caregiver with curbs       CHAIR OPTIONS MANUAL & POWER      Armrests   [x]  adjustable height []  removable  []  swing away []  fixed  [x]  flip back  []  reclining  []  full length pads [x]  desk []  tube arms []  gel pads  [x]  provide support  with elbow at 90    [x]  remove/flip back/swing away for  transfers  [x]  provide support and positioning of upper body    [x]  allow to come closer to table top  [x]  remove for access to tables  []  provide support for w/c tray  []  change of height/angles for variable activities   []  Elbow support / Elbow stop  []  keep elbow positioned on arm pad  []  keep arms from falling off arm pad  during tilt and/or recline   Upper Extremity Support  []  Arm trough  []   R  []   L  Style:  []  swivel mount []  fixed mount   []  posterior hand support  []   tray  []  full tray  []  joystick cut out  []   R  []   L  Style:  []  decrease gravitational pull on      shoulders  []  provide support to increase UE  function  []  provide hand support in natural    position  []  position flaccid UE  []  decrease subluxation    []  decrease edema       []  manage spasticity   []  provide midline positioning  []  provide work surface  []  placement for AAC/ Computer/ EADL    Hangers/ Legrests   []  ______ degree  []  Elevating []  articulating  []  swing away []  fixed []  lift off  []  heavy duty  []  adjustable knee angle  []  adjustable calf panel   []  longer extension tube              []  provide LE support  []  maintain placement of feet on      footplate   []  accommodate lower leg length  []  accommodate to hamstring       tightness  []  enable transfers  []  provide change in position for LE's  []  elevate legs during recline    []  decrease edema  []  durability      Foot support   []  footplate []  R []  L []  flip up           []  Depth adjustable   []  angle adjustable  []  foot board/one piece    []  provide foot support  []  accommodate to ankle ROM  []  allow foot to go under wheelchair base  []  enable transfers     []  Shoe holders  []   position foot    []  decrease / manage spasticity  []  control position of LE  []  stability    []  safety     []  Ankle strap/heel      loops  []  support foot on foot support  []  decrease  extraneous movement  []  provide input to heel   []  protect foot     []  Amputee adapter []  R  []  L     Style:                  Size:  []  Provide support for stump/residual extremity    []  Transportation tie-down  []  to provide crash tested tie-down brackets    []  Crutch/cane holder    []  O2 holder    []  IV hanger   []  Ventilator tray/mount    []  stabilize accessory on wheelchair       Component  Justification     [x]  Seat cushion     Comfort Curve Cushion []  accommodate impaired sensation  []  decubitus ulcers present or history  []  unable to shift weight  [x]  increase pressure distribution  []  prevent pelvic extension  []  custom required "off-the-shelf"    seat cushion will not accommodate deformity  []  stabilize/promote pelvis alignment  []  stabilize/promote femur alignment  []  accommodate obliquity  []  accommodate multiple deformity  []  incontinent/accidents  [x]  low maintenance     []  seat mounts                 []  fixed []  removable  []  attach seat platform/cushion to wheelchair frame    []  Seat wedge    []  provide increased aggressiveness of seat shape to decrease sliding  down in the seat  []  accommodate ROM        []  Cover replacement   []  protect back or seat cushion  []  incontinent/accidents    []  Solid seat / insert    []  support cushion to prevent      hammocking  []  allows attachment of cushion to mobility base    []  Lateral pelvic/thigh/hip     support (Guides)     []  decrease abduction  []  accommodate pelvis  []  position upper legs  []  accommodate spasticity  []  removable for transfers     []  Lateral pelvic/thigh      supports mounts  []  fixed   []  swing-away   []  removable  []  mounts lateral pelvic/thigh supports     []  mounts lateral pelvic/thigh supports swing-away or removable for transfers    []  Medial thigh support (Pommel)  [] decrease adduction  [] accommodate ROM  []  remove for transfers   []  alignment      []  Medial thigh   []  fixed      support  mounts      []  swing-away   []  removable  []  mounts medial thigh supports   []  Mounts medial supports swing- away or removable for transfers     Component  Justification   []  Back       []  provide posterior trunk support []  facilitate tone  []  provide lumbar/sacral support []  accommodate deformity  []  support trunk in midline   []  custom required "off-the-shelf" back support will not accommodate deformity   []  provide lateral trunk support []  accommodate or decrease tone            []  Back mounts  []  fixed  []  removable  []  attach back rest/cushion to wheelchair frame   []   Lateral trunk      supports  []  R []  L  []  decrease lateral trunk leaning  []  accommodate asymmetry    []  contour for increased contact  []  safety    []  control of tone    []  Lateral trunk      supports mounts  []  fixed  []  swing-away   []  removable  []  mounts lateral trunk supports     []  Mounts lateral trunk supports swing-away or removable for transfers   []  Anterior chest      strap, vest     []  decrease forward movement of shoulder  []  decrease forward movement of trunk  []  safety/stability  []  added abdominal support  []  trunk alignment  []  assistance with shoulder control   []  decrease shoulder elevation    []  Headrest      []  provide posterior head support  []  provide posterior neck support  []  provide lateral head support  []  provide anterior head support  []  support during tilt and recline  []  improve feeding     []  improve respiration  []  placement of switches  []  safety    []  accommodate ROM   []  accommodate tone  []  improve visual orientation   []  Headrest           []  fixed []  removable []  flip down      Mounting hardware   []  swing-away laterals/switches  []  mount headrest   []  mounts headrest flip down or  removable for transfers  []  mount headrest swing-away laterals   []  mount switches     []  Neck Support    []  decrease neck rotation  []  decrease forward neck flexion   Pelvic  Positioner    []  std hip belt          []  padded hip belt  []  dual pull hip belt  []  four point hip belt  []  stabilize tone  []  decrease falling out of chair  []  prevent excessive extension  []  special pull angle to control      rotation  []  pad for protection over boney   prominence  []  promote comfort    []  Essential needs        bag/pouch   []  medicines []  special food rorthotics []  clothing changes  []  diapers  []  catheter/hygiene []  ostomy supplies   The above equipment has a life- long use expectancy.  Growth and changes in medical and/or functional conditions would be the exceptions.   SUMMARY:  Why mobility device was selected; include why a lower level device is not appropriate:    ASSESSMENT:  CLINICAL IMPRESSION: Patient is a 33 y.o. female who was seen today for physical therapy evaluation for a custom K5 wheelchair. 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 She requires a K5 wheelchair for improved and amputee placement of the rear wheels for increased stability in her wheelchair. This would also increase the ergonomics of her propulsion- decreasing her risk for overuse injuries of her upper extremities. A fully adjustable rear wheel should be removable for ease of transport as she will need to use her wheelchair to allow her to access various doctors appointments. Standard push rims on her rear wheels will enable her to propel her wheelchair independently. Anti- tippers are a necessary safety feature due to her status as a bilateral above knee amputee and to assist her with navigating community obstacles.  She requires push handles  on her wheelchair to allow for various caregivers to push her in her wheelchair and assist her with navigating community obstacles, such as curbs. Adjustable height, desk length and flip back arm rests will provide the patient with adequate upper extremity support. They must be flip back to  allow for safe transfers in and out of her wheelchair. Desk -length arm rests will allow her to approach various work surfaces to complete her MRADLs from a safe, seated position in her wheelchair. She will also require these arm rests in order to complete wheelchair push ups as a form of pressure relief. She requires a Comfort Curve Cushion for improved pressure relief to minimize her risk for developing pressure injuries. She is a full time wheelchair user and is, therefore, at a greater risk for developing a pressure injury.  A custom K5 wheelchair is a lifelong medical need for this patient. She is an above knee amputee and is non-ambulatory. She will use this K5 wheelchair to complete any and all of her MRADLs with the greatest level of safety, efficiency and independence.    OBJECTIVE IMPAIRMENTS Abnormal gait, decreased activity tolerance, decreased balance, decreased endurance, decreased knowledge of condition, decreased knowledge of use of DME, difficulty walking, decreased ROM, decreased strength, impaired UE functional use, impaired vision/preception, and prosthetic dependency .   ACTIVITY LIMITATIONS carrying, lifting, standing, stairs, hygiene/grooming, locomotion level, and caring for others  PARTICIPATION LIMITATIONS: meal prep, cleaning, laundry, interpersonal relationship, shopping, community activity, and occupation  PERSONAL FACTORS Age, Past/current experiences, Social background, Time since onset of injury/illness/exacerbation, and Transportation are also affecting patient's functional outcome.   REHAB POTENTIAL: Good  CLINICAL DECISION MAKING: Stable/uncomplicated  EVALUATION COMPLEXITY: High                                   GOALS: One time visit. No goals established.    PLAN: PT FREQUENCY: one time visit    Westley Foots, PT Westley Foots, PT, DPT, CBIS  05/22/2023, 3:23 PM    I concur with the above findings and recommendations of the  therapist:  Physician name printed:         Physician's signature:      Date:

## 2023-05-25 ENCOUNTER — Encounter: Payer: Self-pay | Admitting: Physical Therapy

## 2023-05-25 ENCOUNTER — Ambulatory Visit: Payer: 59 | Admitting: Physical Therapy

## 2023-05-25 ENCOUNTER — Ambulatory Visit: Admitting: Occupational Therapy

## 2023-05-25 DIAGNOSIS — R2689 Other abnormalities of gait and mobility: Secondary | ICD-10-CM

## 2023-05-25 DIAGNOSIS — R29898 Other symptoms and signs involving the musculoskeletal system: Secondary | ICD-10-CM

## 2023-05-25 DIAGNOSIS — M6281 Muscle weakness (generalized): Secondary | ICD-10-CM | POA: Diagnosis not present

## 2023-05-25 DIAGNOSIS — R293 Abnormal posture: Secondary | ICD-10-CM

## 2023-05-25 NOTE — Therapy (Signed)
 OUTPATIENT OCCUPATIONAL THERAPY NEURO TREATMENT  Patient Name: Kelli Hudson MRN: [id number] DOB:14-Oct-1990, 33 y.o., female Today's Date: 05/25/2023  PCP: Cristino Martes, NP  REFERRING PROVIDER: Nadara Mustard, MD  END OF SESSION:  OT End of Session - 05/25/23 1331     Visit Number 4    Number of Visits 17   including eval   Date for OT Re-Evaluation 06/26/23    Authorization Type [employer] Medicare Dual/Medicaid, covered 100%    Progress Note Due on Visit 10    OT Start Time 1408    OT Stop Time 1446    OT Time Calculation (min) 38 min    Activity Tolerance Patient tolerated treatment well    Behavior During Therapy WFL for tasks assessed/performed            Past Medical History:  Diagnosis Date   Acute H. pylori gastric ulcer    Coffee ground emesis    Diabetes mellitus (HCC)    Diabetic gastroparesis (HCC)    DKA (diabetic ketoacidosis) (HCC) 02/24/2021   Gastroparesis    GERD (gastroesophageal reflux disease)    Hypertension    Hyperthyroidism    Intractable nausea and vomiting 04/20/2021   Normocytic anemia 04/20/2020   Prolonged Q-T interval on ECG    Past Surgical History:  Procedure Laterality Date   AMPUTATION Bilateral 10/24/2022   Procedure: BILATERAL BELOW KNEE AMPUTATION;  Surgeon: Nadara Mustard, MD;  Location: [facility] OR;  Service: Orthopedics;  Laterality: Bilateral;   AMPUTATION Bilateral 10/08/2022   Procedure: BILATERAL LEG DEBRIDEMENT;  Surgeon: Nadara Mustard, MD;  Location: [facility] OR;  Service: Orthopedics;  Laterality: Bilateral;   AMPUTATION Bilateral 11/14/2022   Procedure: BILATERAL ABOVE KNEE AMPUTATION;  Surgeon: Nadara Mustard, MD;  Location: [facility] OR;  Service: Orthopedics;  Laterality: Bilateral;   AMPUTATION TOE Left 03/10/2018   Procedure: AMPUTATION FIFTH TOE;  Surgeon: Vivi Barrack, DPM;  Location: [facility];  Service: Podiatry;  Laterality: Left;   APPLICATION OF WOUND VAC Bilateral 10/12/2022   Procedure:  APPLICATION OF WOUND VAC BILATERAL LEGS;  Surgeon: Myrene Galas, MD;  Location: [facility] OR;  Service: Orthopedics;  Laterality: Bilateral;   APPLICATION OF WOUND VAC Bilateral 10/24/2022   Procedure: APPLICATION OF WOUND VAC;  Surgeon: Nadara Mustard, MD;  Location: [facility] OR;  Service: Orthopedics;  Laterality: Bilateral;   BIOPSY  01/28/2020   Procedure: BIOPSY;  Surgeon: Kathi Der, MD;  Location: [facility];  Service: Gastroenterology;;   BOTOX INJECTION  08/26/2021   Procedure: BOTOX INJECTION;  Surgeon: Sherrilyn Rist, MD;  Location: [facility];  Service: Gastroenterology;;   ESOPHAGOGASTRODUODENOSCOPY N/A 01/28/2020   Procedure: ESOPHAGOGASTRODUODENOSCOPY (EGD);  Surgeon: Kathi Der, MD;  Location: [facility];  Service: Gastroenterology;  Laterality: N/A;   ESOPHAGOGASTRODUODENOSCOPY (EGD) WITH PROPOFOL Left 09/08/2015   Procedure: ESOPHAGOGASTRODUODENOSCOPY (EGD) WITH PROPOFOL;  Surgeon: Willis Modena, MD;  Location: [facility];  Service: Endoscopy;  Laterality: Left;   ESOPHAGOGASTRODUODENOSCOPY (EGD) WITH PROPOFOL N/A 08/26/2021   Procedure: ESOPHAGOGASTRODUODENOSCOPY (EGD) WITH PROPOFOL;  Surgeon: Sherrilyn Rist, MD;  Location: [facility];  Service: Gastroenterology;  Laterality: N/A;   GASTRIC STIMULATOR IMPLANT SURGERY  06/2022   at [facility]   I & D EXTREMITY Bilateral 10/12/2022   Procedure: IRRIGATION AND  DEBRIDEMENT  BILATERAL LEGS;  Surgeon: Myrene Galas, MD;  Location: [facility] OR;  Service: Orthopedics;  Laterality: Bilateral;   I & D EXTREMITY Right 10/13/2022   Procedure: RIGHT THIGH WOUND VAC EXCHANGE;  Surgeon: Roby Lofts, MD;  Location: [facility] OR;  Service: Orthopedics;  Laterality: Right;   I & D EXTREMITY Bilateral 10/17/2022   Procedure: BILATERAL THIGH AND LEG DEBRIDEMENT, PARTIAL BILATERAL FOOT AMPUTATION;  Surgeon: Nadara Mustard, MD;  Location: [facility] OR;  Service: Orthopedics;  Laterality: Bilateral;   I & D EXTREMITY Bilateral 11/05/2022   Procedure:  BILATERAL THIGH DEBRIDEMENT;  Surgeon: Nadara Mustard, MD;  Location: [facility] OR;  Service: Orthopedics;  Laterality: Bilateral;   I & D EXTREMITY Bilateral 11/07/2022   Procedure: BILATERAL THIGH AND LEG DEBRIDEMENT;  Surgeon: Nadara Mustard, MD;  Location: [facility] OR;  Service: Orthopedics;  Laterality: Bilateral;   I & D EXTREMITY Bilateral 11/12/2022   Procedure: BILATERAL LEG DEBRIDEMENTS;  Surgeon: Nadara Mustard, MD;  Location: [facility] OR;  Service: Orthopedics;  Laterality: Bilateral;   IRRIGATION AND DEBRIDEMENT FOOT Bilateral 10/05/2022   Procedure: IRRIGATION AND DEBRIDEMENT FOOT;  Surgeon: [name], DPM;  Location: [facility];  Service: Orthopedics/Podiatry;  Laterality: Bilateral;   WISDOM TOOTH EXTRACTION     Patient Active Problem List   Diagnosis Date Noted   Influenza A 04/05/2023   Elevated lipase 04/05/2023   Esophagitis 03/24/2023   SIRS (systemic inflammatory response syndrome) (HCC) 03/02/2023   Fever 03/02/2023   Metabolic acidosis 03/02/2023   Hypertensive urgency 02/21/2023   Chronic pain 02/21/2023   Abscess of thigh 11/28/2022   C. 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   Hypokalemia 11/27/2022   Effusion, left knee 11/05/2022   MDD (major depressive disorder), recurrent episode, moderate (HCC) 10/16/2022   Necrotizing fasciitis of pelvic region and thigh (HCC) 10/08/2022   Necrotizing fasciitis of lower leg (HCC) 10/08/2022   Necrotizing fasciitis (HCC) 10/08/2022   Sepsis due to cellulitis (HCC) 10/03/2022   Cellulitis of lower extremity 10/03/2022   Multinodular goiter 06/18/2022   Malnutrition of moderate degree 04/18/2022   Intractable vomiting with nausea 04/17/2022   DKA (diabetic ketoacidosis) (HCC) 03/04/2022   Nausea vomiting and diarrhea 12/25/2021   Diabetic gastroparesis (HCC) 10/09/2021   Drug-seeking behavior 09/21/2021   Essential hypertension 08/25/2021   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) 08/25/2021   GERD without esophagitis 08/25/2021   Hematemesis    Prolonged Q-T interval on ECG    Nausea and vomiting 07/20/2021   Anemia of chronic disease 06/06/2021   Dehydration 06/04/2021   Hypomagnesemia 04/21/2021   Ulcerated, foot, left, with fat layer exposed (HCC) 04/20/2021   Uncontrolled type 1 diabetes mellitus with hyperglycemia, with long-term current use of insulin (HCC) 12/18/2020   DM type 1 (diabetes mellitus, type 1) (HCC) 12/18/2020   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) 11/09/2020   Diabetic retinopathy of both eyes associated with type 1 diabetes mellitus (HCC) 09/24/2020   Diabetic gastroparesis associated with type 1 diabetes mellitus (HCC) 08/12/2020   Acute kidney injury superimposed on chronic kidney disease (HCC) 07/25/2020   Generalized abdominal pain 07/23/2020   GERD (gastroesophageal reflux disease) 07/23/2020   Diabetic nephropathy associated with type 1 diabetes mellitus (HCC) 06/27/2020   Sepsis (HCC) 05/27/2020   HLD (hyperlipidemia) 05/23/2020   Sinus tachycardia 05/09/2020   Anemia 04/20/2020   Depression with anxiety 07/05/2019   Diabetic polyneuropathy associated with type 1 diabetes mellitus (HCC) 09/24/2017   Gastroparesis    Goiter 10/05/2009   DM2 (diabetes mellitus, type 2) (HCC) 10/05/2009   Hypertension associated with diabetes (HCC) 10/05/2009  ONSET DATE: 04/16/23 (referral date)  REFERRING DIAG: [phone] (ICD-10-CM) - S/P AKA (above knee amputation) bilateral   THERAPY DIAG:  Muscle weakness (generalized)  Other symptoms and signs involving the musculoskeletal system  Rationale for Evaluation and Treatment: Rehabilitation  SUBJECTIVE:   SUBJECTIVE STATEMENT:  Pt reports she has not had orientation and mobility training.   Pt accompanied by: self,  friend Kenard Gower)  PERTINENT HISTORY: DM, diabetic gastroparesis, GERD, HTN, hyperthyroidism   PRECAUTIONS: Fall; B diabetic  retinopathy   WEIGHT BEARING RESTRICTIONS: No  PAIN:  Are you having pain? No  FALLS: Has patient fallen in last 6 months? Yes. Number of falls 1 - around Thanksgiving when attempting to transfer to a second w/c  LIVING ENVIRONMENT:  Lives with: lives alone Lives in: House/apartment Stairs: No Has following equipment at home: Wheelchair (manual) and shower chair; aide 7 days/week for 4 hours  PLOF: Independent with basic ADLs, community mobility: assistance from friends  PATIENT GOALS: "I want to work on standing and balance"  OBJECTIVE:  Note: Objective measures were completed at Evaluation unless otherwise noted.  There were no vitals filed for this visit.    HAND DOMINANCE: Right  ADLs: Overall ADLs: ind, cautious Eating: ind Grooming: ind UB Dressing: ind, some difficulty with donning standard bras though no difficulty with donning sports bras LB Dressing: ind Toileting: ind Bathing: prefers at least direct or distant supervision though has completed ind - "I'm scared because shower chair moves a little bit because it's a little bigger than the tub so I feel better when someone is there." Tub Shower transfers: tub transfer bench Equipment: tub transfer bench, tub/shower  IADLs: Meal Prep: difficulty cutting food for meal prep/cooking when attempting to stand. Pt reported typically using microwave meals and sometimes using stove/oven. Pt reported trying to not be afraid of doing "normal stuff" again.  Handwriting:  Pt reported no need for handwriting, decreased vision.  Pt reported using voice text to communicate using phone.  MOBILITY STATUS:  w/c, reported standing in prosthetics only when coming to therapy though will attempt to stand at home with another person to supervise  soon  POSTURE COMMENTS:  rounded shoulders and forward head  ACTIVITY TOLERANCE: Activity tolerance: about the same as PLOF, more tired after standing tasks though only for "just 10  minutes"  FUNCTIONAL OUTCOME MEASURES: PSFS: 6.7   Total score = sum of the activity scores/number of activities Minimum detectable change (90%CI) for average score = 2 points Minimum detectable change (90%CI) for single activity score = 3 points  UPPER EXTREMITY ROM:    Active ROM Right eval Left eval  Shoulder flexion [facility]  Shoulder abduction    Shoulder adduction    Shoulder extension    Shoulder internal rotation    Shoulder external rotation    Elbow flexion    Elbow extension    Wrist flexion    Wrist extension    Wrist ulnar deviation    Wrist radial deviation    Wrist pronation    Wrist supination    (Blank rows = not tested)  UPPER EXTREMITY MMT:     MMT Right eval Left eval  Shoulder flexion    Shoulder abduction    Shoulder adduction    Shoulder extension    Shoulder internal rotation    Shoulder external rotation    Middle trapezius    Lower trapezius    Elbow flexion    Elbow extension    Wrist flexion  Wrist extension    Wrist ulnar deviation    Wrist radial deviation    Wrist pronation    Wrist supination    (Blank rows = not tested)  HAND FUNCTION: Grip strength: Right: 57 lbs; Left: 39 lbs  Pt reported some difficulty with tasks requiring strength of L side. Pt reported noticing weakness of L side compared to R side.  COORDINATION: 9 Hole Peg test: Right: 37 sec; Left: 33 sec, decreased speed secondary to visual deficits  SENSATION: Pt denied numbness/tingling/discomfort of BLE and BUE.  EDEMA: R eyelid swollen.  MUSCLE TONE: RUE: Within functional limits and LUE: Within functional limits  COGNITION: Overall cognitive status: Within functional limits for tasks assessed  VISION: Subjective report: Corrective lens no longer help, low vision per pt, better seeing close up. Pt reported some peripheral vision L side, less peripheral vision R side. Baseline vision: B diabetic retinopathy: "it's low."  Visual history:  B  diabetic retinopathy  VISION ASSESSMENT: Visual tracking: Difficulty tracking black pen. Improved ability to track brightly colored marker to all 4 quadrants.  PERCEPTION: Not tested  PRAXIS: Not tested  OBSERVATIONS: Pt in w/c without prosthetics though with BLE prosthetics available nearby. Pt pleasant with decreased vision secondary to B retinopathy. Pt's friend assisted pt with w/c mobility today.                                                                                                                       TREATMENT:    OT educated pt on parallel trailing with manual wheelchair. Pt practiced down 3 hallways navigating around waiting chairs, counters, and treatment tables.   OT reviewed vision strategies including use of Magnifier app on iPhone. OT educated pt on how to use invert filter as well as capture with reader to assist with reading materials. Pt able to use to read side of Kleenex box with increased time and min cueing for navigation/proper use.   OT provided information on O&M training as noted in pt instructions.   PATIENT EDUCATION: Education details: see today's tx above Person educated: Patient and friend Education method: Programmer, multimedia, Facilities manager, Verbal cues, and Handouts Education comprehension: verbalized understanding, returned demonstration, verbal cues required, and needs further education  HOME EXERCISE PROGRAM: 05/18/2023: putty and vision strategies 05/25/2023: O&M training  GOALS: Goals reviewed with patient? Yes  SHORT TERM GOALS: Target date: 05/29/23  Pt will recall or demo at least 2 visual compensation strategies Baseline: impaired vision secondary to diabetic retinopathy 05/21/23 - Pt ind recalled using line guide, turning head to look to R, using finger to visually track information.  Goal status: MET  2.  Pt will verbalize or demo understanding of safe simulated shower transfers modI with A/E and adaptive strategies PRN. Baseline: Pt ind  though fearful and prefers supervision during showers and shower transfers d/t unstable shower chair/bench. Pt has tub transfer bench and transfer board available.  Goal status: INITIAL  3.  Pt will ind demo understanding of LUE HEP.  Baseline: New to outpt OT 05/21/23 - Pt returned demo of theraputty.  Goal status: MET  4. Pt will verbalize understanding of community resources for individuals with amputations.  Baseline: new to outpt OT  05/21/23 - Pt verbalized understanding of resources.  Goal status: MET  LONG TERM GOALS: Target date: 06/26/23  Pt will ind demo understanding of updated HEP. Baseline: new to outpt OT Goal status: INITIAL  2.  Patient will report at least two-point increase in average PSFS score or at least three-point increase in a single activity score indicating functionally significant improvement given minimum detectable change using BLE prosthetics PRN. Baseline: 6.7 total score (See above for individual activity scores) without use of BLE prosthetics.  Goal status: INITIAL  3.  Pt will demo at least 45 lbs LUE grip strength as needed to open jars/containers. Baseline: Grip strength: Right: 57 lbs; Left: 39 lbs 05/25/2023: 38 lbs Goal status: INITIAL  4.  Pt will complete simple meal prep tasks modI using BLE prosthetics as able. Baseline: pt beginning to trial use of BLE prosthetics. Pt reported difficulty with meal prep tasks currently. Goal status: INITIAL  ASSESSMENT:  CLINICAL IMPRESSION: Pt demonstrates good understanding of vision strategies this visit. She will likely benefit from O&M training in the future for use with new wheelchair and ambulating with BLE prosthetics to improve independence and safety with ADL and IADL completion.   PERFORMANCE DEFICITS: in functional skills including ADLs, IADLs, coordination, dexterity, proprioception, edema, strength, flexibility, Fine motor control, Gross motor control, mobility, balance, body mechanics, endurance,  decreased knowledge of use of DME, vision, and UE functional use, cognitive skills including energy/drive, and psychosocial skills including environmental adaptation.   IMPAIRMENTS: are limiting patient from ADLs, IADLs, rest and sleep, work, leisure, and social participation.   CO-MORBIDITIES: may have co-morbidities  that affects occupational performance. Patient will benefit from skilled OT to address above impairments and improve overall function.  REHAB POTENTIAL: Good  PLAN:  OT FREQUENCY: 2x/week  OT DURATION: 8 weeks  PLANNED INTERVENTIONS: 97168 OT Re-evaluation, 97535 self care/ADL training, 78295 therapeutic exercise, 97530 therapeutic activity, 97112 neuromuscular re-education, 97140 manual therapy, 97035 ultrasound, 97018 paraffin, 62130 fluidotherapy, 97010 moist heat, 97010 cryotherapy, 97034 contrast bath, 97760 Orthotics management and training, 86578 Splinting (initial encounter), [id number] Subsequent splinting/medication, passive range of motion, functional mobility training, visual/perceptual remediation/compensation, energy conservation, patient/family education, and DME and/or AE instructions  RECOMMENDED OTHER SERVICES: PT eval already completed  CONSULTED AND AGREED WITH PLAN OF CARE: Patient  PLAN FOR NEXT SESSION:  Monitor per medical event on 05/14/23  Practice standing with BLE prosthetics - incorporate functional tasks as able   Delana Meyer, OT 05/25/2023, 2:52 PM

## 2023-05-25 NOTE — Patient Instructions (Signed)
 Orientation and Mobility Training Hortencia Conradi: [phone]

## 2023-05-25 NOTE — Therapy (Signed)
 OUTPATIENT PHYSICAL THERAPY NEURO TREATMENT/10th Visit PN!   Patient Name: Kelli [facility] MRN: [id number] DOB:09/27/1990, 33 y.o., female Today's Date: 05/25/2023   PCP: Cristino Martes, NP REFERRING PROVIDER: Aldean Baker, MD  PT progress note for [facility].  Reporting period 04/13/2023 to 05/25/2023  See Note below for Objective Data and Assessment of Progress/Goals  Thank you for the referral of this patient. Camille Bal, PT, DPT   END OF SESSION:  PT End of Session - 05/25/23 1326     Visit Number 11    Number of Visits 25    Date for PT Re-Evaluation 07/10/23    Authorization Type Medicare/Medicaid    PT Start Time 1321   pt requests to go to bathroom at onset of session   PT Stop Time 1408    PT Time Calculation (min) 47 min    Equipment Utilized During Treatment Gait belt    Activity Tolerance Patient tolerated treatment well    Behavior During Therapy WFL for tasks assessed/performed             Past Medical History:  Diagnosis Date   Acute H. pylori gastric ulcer    Coffee ground emesis    Diabetes mellitus (HCC)    Diabetic gastroparesis (HCC)    DKA (diabetic ketoacidosis) (HCC) 02/24/2021   Gastroparesis    GERD (gastroesophageal reflux disease)    Hypertension    Hyperthyroidism    Intractable nausea and vomiting 04/20/2021   Normocytic anemia 04/20/2020   Prolonged Q-T interval on ECG    Past Surgical History:  Procedure Laterality Date   AMPUTATION Bilateral 10/24/2022   Procedure: BILATERAL BELOW KNEE AMPUTATION;  Surgeon: Nadara Mustard, MD;  Location: [facility] OR;  Service: Orthopedics;  Laterality: Bilateral;   AMPUTATION Bilateral 10/08/2022   Procedure: BILATERAL LEG DEBRIDEMENT;  Surgeon: Nadara Mustard, MD;  Location: [facility] OR;  Service: Orthopedics;  Laterality: Bilateral;   AMPUTATION Bilateral 11/14/2022   Procedure: BILATERAL ABOVE KNEE AMPUTATION;  Surgeon: Nadara Mustard, MD;  Location: [facility] OR;  Service:  Orthopedics;  Laterality: Bilateral;   AMPUTATION TOE Left 03/10/2018   Procedure: AMPUTATION FIFTH TOE;  Surgeon: Vivi Barrack, DPM;  Location: [facility];  Service: Podiatry;  Laterality: Left;   APPLICATION OF WOUND VAC Bilateral 10/12/2022   Procedure: APPLICATION OF WOUND VAC BILATERAL LEGS;  Surgeon: Myrene Galas, MD;  Location: [facility] OR;  Service: Orthopedics;  Laterality: Bilateral;   APPLICATION OF WOUND VAC Bilateral 10/24/2022   Procedure: APPLICATION OF WOUND VAC;  Surgeon: Nadara Mustard, MD;  Location: [facility] OR;  Service: Orthopedics;  Laterality: Bilateral;   BIOPSY  01/28/2020   Procedure: BIOPSY;  Surgeon: Kathi Der, MD;  Location: [facility];  Service: Gastroenterology;;   BOTOX INJECTION  08/26/2021   Procedure: BOTOX INJECTION;  Surgeon: Sherrilyn Rist, MD;  Location: [facility];  Service: Gastroenterology;;   ESOPHAGOGASTRODUODENOSCOPY N/A 01/28/2020   Procedure: ESOPHAGOGASTRODUODENOSCOPY (EGD);  Surgeon: Kathi Der, MD;  Location: [facility];  Service: Gastroenterology;  Laterality: N/A;   ESOPHAGOGASTRODUODENOSCOPY (EGD) WITH PROPOFOL Left 09/08/2015   Procedure: ESOPHAGOGASTRODUODENOSCOPY (EGD) WITH PROPOFOL;  Surgeon: Willis Modena, MD;  Location: [facility] ENDOSCOPY;  Service: Endoscopy;  Laterality: Left;   ESOPHAGOGASTRODUODENOSCOPY (EGD) WITH PROPOFOL N/A 08/26/2021   Procedure: ESOPHAGOGASTRODUODENOSCOPY (EGD) WITH PROPOFOL;  Surgeon: Sherrilyn Rist, MD;  Location: [facility];  Service: Gastroenterology;  Laterality: N/A;   GASTRIC STIMULATOR IMPLANT SURGERY  06/2022   at [facility]   I & D  EXTREMITY Bilateral 10/12/2022   Procedure: IRRIGATION AND  DEBRIDEMENT  BILATERAL LEGS;  Surgeon: Myrene Galas, MD;  Location: [facility] OR;  Service: Orthopedics;  Laterality: Bilateral;   I & D EXTREMITY Right 10/13/2022   Procedure: RIGHT THIGH WOUND VAC EXCHANGE;  Surgeon: Roby Lofts, MD;  Location: [facility] OR;  Service: Orthopedics;  Laterality:  Right;   I & D EXTREMITY Bilateral 10/17/2022   Procedure: BILATERAL THIGH AND LEG DEBRIDEMENT, PARTIAL BILATERAL FOOT AMPUTATION;  Surgeon: Nadara Mustard, MD;  Location: [facility] OR;  Service: Orthopedics;  Laterality: Bilateral;   I & D EXTREMITY Bilateral 11/05/2022   Procedure: BILATERAL THIGH DEBRIDEMENT;  Surgeon: Nadara Mustard, MD;  Location: [facility] OR;  Service: Orthopedics;  Laterality: Bilateral;   I & D EXTREMITY Bilateral 11/07/2022   Procedure: BILATERAL THIGH AND LEG DEBRIDEMENT;  Surgeon: Nadara Mustard, MD;  Location: [facility] OR;  Service: Orthopedics;  Laterality: Bilateral;   I & D EXTREMITY Bilateral 11/12/2022   Procedure: BILATERAL LEG DEBRIDEMENTS;  Surgeon: Nadara Mustard, MD;  Location: [facility] OR;  Service: Orthopedics;  Laterality: Bilateral;   IRRIGATION AND DEBRIDEMENT FOOT Bilateral 10/05/2022   Procedure: IRRIGATION AND DEBRIDEMENT FOOT;  Surgeon: [name], DPM;  Location: [facility];  Service: Orthopedics/Podiatry;  Laterality: Bilateral;   WISDOM TOOTH EXTRACTION     Patient Active Problem List   Diagnosis Date Noted   Influenza A 04/05/2023   Elevated lipase 04/05/2023   Esophagitis 03/24/2023   SIRS (systemic inflammatory response syndrome) (HCC) 03/02/2023   Fever 03/02/2023   Metabolic acidosis 03/02/2023   Hypertensive urgency 02/21/2023   Chronic pain 02/21/2023   Abscess of thigh 11/28/2022   C. 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   Hypokalemia 11/27/2022   Effusion, left knee 11/05/2022   MDD (major depressive disorder), recurrent episode, moderate (HCC) 10/16/2022   Necrotizing fasciitis of pelvic region and thigh (HCC) 10/08/2022   Necrotizing fasciitis of lower leg (HCC) 10/08/2022   Necrotizing fasciitis (HCC) 10/08/2022   Sepsis due to cellulitis (HCC) 10/03/2022   Cellulitis of lower extremity 10/03/2022   Multinodular goiter 06/18/2022   Malnutrition of moderate degree 04/18/2022   Intractable  vomiting with nausea 04/17/2022   DKA (diabetic ketoacidosis) (HCC) 03/04/2022   Nausea vomiting and diarrhea 12/25/2021   Diabetic gastroparesis (HCC) 10/09/2021   Drug-seeking behavior 09/21/2021   Essential hypertension 08/25/2021   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) 08/25/2021   GERD without esophagitis 08/25/2021   Hematemesis    Prolonged Q-T interval on ECG    Nausea and vomiting 07/20/2021   Anemia of chronic disease 06/06/2021   Dehydration 06/04/2021   Hypomagnesemia 04/21/2021   Ulcerated, foot, left, with fat layer exposed (HCC) 04/20/2021   Uncontrolled type 1 diabetes mellitus with hyperglycemia, with long-term current use of insulin (HCC) 12/18/2020   DM type 1 (diabetes mellitus, type 1) (HCC) 12/18/2020   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) 11/09/2020   Diabetic retinopathy of both eyes associated with type 1 diabetes mellitus (HCC) 09/24/2020   Diabetic gastroparesis associated with type 1 diabetes mellitus (HCC) 08/12/2020   Acute kidney injury superimposed on chronic kidney disease (HCC) 07/25/2020   Generalized abdominal pain 07/23/2020   GERD (gastroesophageal reflux disease) 07/23/2020   Diabetic nephropathy associated with type 1 diabetes mellitus (HCC) 06/27/2020   Sepsis (HCC) 05/27/2020   HLD (hyperlipidemia) 05/23/2020   Sinus tachycardia 05/09/2020   Anemia 04/20/2020  Depression with anxiety 07/05/2019   Diabetic polyneuropathy associated with type 1 diabetes mellitus (HCC) 09/24/2017   Gastroparesis    Goiter 10/05/2009   DM2 (diabetes mellitus, type 2) (HCC) 10/05/2009   Hypertension associated with diabetes (HCC) 10/05/2009    ONSET DATE: 04/07/23 referral  REFERRING DIAG: [phone] (ICD-10-CM) - S/P AKA (above knee amputation) bilateral (HCC)   THERAPY DIAG:  Muscle weakness (generalized)  Other symptoms and signs involving the musculoskeletal system  Abnormal posture  Other  abnormalities of gait and mobility  Rationale for Evaluation and Treatment: Rehabilitation  SUBJECTIVE:                                                                                                                                                                                             SUBJECTIVE STATEMENT: Patient reports doing fine - her eyes appear puffier this visit than prior. She has to go to another doctor today for visual follow-up due to this ongoing issue. She fell yesterday when trying to grab her keys before they fell and the chair went backwards.  Her CNA was present and helped her back into chair.  She denies hitting head or injury during this particular fall.  Pt accompanied by: friend  PERTINENT HISTORY: DM, diabetic gastroparesis, GERD, HTN, hyperthyroidism  PAIN:  Are you having pain? No  VITALS: There were no vitals filed for this visit.  PRECAUTIONS: Fall; B diabetic retinopathy  PATIENT GOALS: "to be able to walk again"                                                                                                                              TREATMENT -Increased time and verbal cues for pt to don prosthetics - she dons opposite socks despite notifying her that she typically uses 5-ply on the right vs left.  She states she "will just see how it fits today." -using 6" step to prop bucket of squigz for overhead reach and placement on mirror then crossbody return to container 5 Blaze pods on visual scanning setting for improved scanning and overhead  reach/weight shifting.  Performed on 1 minute intervals with 30 second rest periods.  Pt requires SBA guarding. Round 1:  Standing at ballet bar alternating UE taps w/ unilateral UE support setup.  29 hits. Round 2:  " setup.  36 hits. Round 3:  " setup.  36 hits. Notable errors/deficits:  Pt maintains close pull of body to bar for added stability, some ER of RLE in weight shifted position. -Pt practices step turns  using ballet bar to left and right CGA > forward free arm taps to 6" step x8 CGA each UE using ballet bar or chair back for support on contralateral side -Pt doffs legs once returned to chair  PATIENT EDUCATION: Education details: Continue HEP Person educated: Patient and friend Education method: Medical illustrator Education comprehension: verbalized understanding and needs further education  HOME EXERCISE PROGRAM: -practice donning/doffing prosthetic Access Code: [email or web address] URL: [email or web address] Date: 04/20/2023 Prepared by: Merry Lofty  Exercises - Prone Hip Extension with Residual Limb (BKA)  - 1 x daily - 7 x weekly - 3 sets - 10 reps - Sidelying Hip Abduction (AKA)  - 1 x daily - 7 x weekly - 3 sets - 10 reps - Supine Piriformis Stretch with Residual Limb (AKA)  - 1 x daily - 7 x weekly - 3 sets - 10 reps  Do each exercise 1-2  times per day Do each exercise 10 repetitions Hold each exercise for 2 seconds to feel your location  AT SINK FIND YOUR MIDLINE POSITION AND PLACE FEET EQUAL DISTANCE FROM THE MIDLINE.  Try to find this position when standing still for activities.   USE TAPE ON FLOOR TO MARK THE MIDLINE POSITION. You also should try to feel with your limb pressure in socket.  You are trying to feel with limb what you used to feel with the bottom of your foot.  Side to Side Shift: Moving your hips only (not shoulders): move weight onto your left leg, HOLD/FEEL.  Move back to equal weight on each leg, HOLD/FEEL. Move weight onto your right leg, HOLD/FEEL. Move back to equal weight on each leg, HOLD/FEEL. Repeat.  Start with both hands on sink, progress to right hand only, then no hands.  Front to Back Shift: Moving your hips only (not shoulders): move your weight forward onto your toes, HOLD/FEEL. Move your weight back to equal Flat Foot on both legs, HOLD/FEEL. Move your weight back onto your heels, HOLD/FEEL. Move your weight back to equal on  both legs, HOLD/FEEL. Repeat.   tart with both hands on sink, progress to right hand only, then no hands. Moving Cones / Cups: With equal weight on each leg: Hold on with one hand the first time, then progress to no hand supports. Move cups from one side of sink to the other. Place cups ~2" out of your reach, progress to 10" beyond reach.  Place one hand in middle of sink and reach with other hand. Do both arms.  Then hover one hand and move cups with other hand. -just use a crossbody reach to end of safe weight shift ROM Overhead/Upward Reaching: alternated reaching up to top cabinets or ceiling if no cabinets present. Keep equal weight on each leg. Start with one hand support on counter while other hand reaches and progress to no hand support with reaching.  ace one hand in middle of sink and reach with other hand. Do both arms.  Then hover one hand and move cups with other hand.  5.  Looking Over Shoulders: With equal weight on each leg: alternate turning to look over your shoulders with one hand support on counter as needed.  Start with head motions only to look in front of shoulder, then even with shoulder and progress to looking behind you. To look to side, move head /eyes, then shoulder on side looking pulls back, shift more weight to side looking and pull hip back. ace one hand in middle of sink and let go with other hand so your shoulder can pull back. Switch hands to look other way.   Then hover one hand and move cups with other hand.  6.  Stepping with leg that is not amputated:  Move items under cabinet out of your way. Shift your hips/pelvis so weight on prosthesis. SLOWLY step other leg so front of foot is in cabinet. Then step back to floor. - just hike the hip and place the foot back in the starting spot  GOALS: Goals reviewed with patient? Yes  SHORT TERM GOALS: Target date: 05/15/23  Pt will be independent with initial HEP for improved gait mechanics and balance  Baseline: to be  updated, reports she has not been doing at home doesn't trust CNA Goal status: NOT MET  2.  Patient will ambulate at least 56ft with LRAD and no more than MinA to demonstrate improved mobility  Baseline: 40ft // bars + MinA, 10 feet with 2WW + minA Goal status: NOT met  3.  Patient will complete sit <> stand with LRAD and no more than CGA for improved independence with transfers Baseline: MinA, required minA in today's session  Goal status: NOT met - required minA due to posterior instability  4.  Patient will complete floor transfer with assist as initial step of fall recovery  Baseline: unable to complete on eval; MinA Goal status: MET   LONG TERM GOALS: Target date: 07/10/23  Pt will be independent with final HEP for improved gait mechanics and balance  Baseline: to be updated Goal status: INITIAL  Patient will ambulate at least 82ft with LRAD, ModI to demonstrate improved mobility  Baseline: 37ft // bars + MinA Goal status: INITIAL  Patient will complete sit <> stand with LRAD. SBA for improved independence with transfers Baseline: MinA Goal status: REVISED  Patient will complete floor transfer without assist as initial step of fall recovery  Baseline: unable to complete on eval Goal status: INITIAL  5. Patient will don/doff prosthetics independently   Baseline: MaxA  Goal status: INITIAL  ASSESSMENT:  CLINICAL IMPRESSION: Patient seen for skilled PT session with emphasis on standing balance to improve self-efficacy for performance of HEP.  She continues to be visually limited with ongoing orbital edema and planned follow-up with MD today following PT.  She was most challenged by turning and forward reaching this visit.  PT to continue to address this and all other deficits as outlined in ongoing PT POC.   OBJECTIVE IMPAIRMENTS: Abnormal gait, decreased activity tolerance, decreased balance, decreased endurance, decreased knowledge of condition, decreased knowledge of  use of DME, decreased strength, impaired vision/preception, and prosthetic dependency .   ACTIVITY LIMITATIONS: carrying, lifting, standing, squatting, transfers, hygiene/grooming, locomotion level, and caring for others  PARTICIPATION LIMITATIONS: meal prep, cleaning, interpersonal relationship, driving, shopping, community activity, and occupation  PERSONAL FACTORS: Age, Behavior pattern, Past/current experiences, Social background, Time since onset of injury/illness/exacerbation, Transportation, and 3+ comorbidities: see above  are also affecting patient's functional outcome.   REHAB POTENTIAL: Good  CLINICAL DECISION MAKING: Stable/uncomplicated  EVALUATION COMPLEXITY: Low  PLAN:  PT FREQUENCY: 2x/week  PT DURATION: 12 weeks  PLANNED INTERVENTIONS: 97164- PT Re-evaluation, 97110-Therapeutic exercises, 97530- Therapeutic activity, 97112- Neuromuscular re-education, 97535- Self Care, [id number]- Manual therapy, [phone]- Gait training, [phone]- Prosthetic training, [phone]- Aquatic Therapy, Patient/Family education, Balance training, Stair training, Scar mobilization, Vestibular training, Visual/preceptual remediation/compensation, DME instructions, and Wheelchair mobility training  PLAN FOR NEXT SESSION: Gait, balance, floor transfers  how is modified sink HEP?, balance recovery in standing/static stability w/ dec UE reliance   Sadie Haber, PT, DPT  05/25/2023, 2:09 PM

## 2023-05-28 ENCOUNTER — Encounter: Payer: Self-pay | Admitting: Physical Therapy

## 2023-05-28 ENCOUNTER — Ambulatory Visit: Payer: 59 | Admitting: Physical Therapy

## 2023-05-28 ENCOUNTER — Ambulatory Visit: Admitting: Occupational Therapy

## 2023-05-28 VITALS — BP 134/84 | HR 99

## 2023-05-28 DIAGNOSIS — M6281 Muscle weakness (generalized): Secondary | ICD-10-CM

## 2023-05-28 DIAGNOSIS — R293 Abnormal posture: Secondary | ICD-10-CM

## 2023-05-28 DIAGNOSIS — R29898 Other symptoms and signs involving the musculoskeletal system: Secondary | ICD-10-CM

## 2023-05-28 DIAGNOSIS — R2689 Other abnormalities of gait and mobility: Secondary | ICD-10-CM

## 2023-05-28 NOTE — Therapy (Signed)
 OUTPATIENT PHYSICAL THERAPY NEURO TREATMENT   Patient Name: Kelli Hudson MRN: [id number] DOB:03/07/1990, 33 y.o., female Today's Date: 05/28/2023   PCP: Cristino Martes, NP REFERRING PROVIDER: Aldean Baker, MD  END OF SESSION:  PT End of Session - 05/28/23 1408     Visit Number 12    Number of Visits 25    Date for PT Re-Evaluation 07/10/23    Authorization Type Medicare/Medicaid    PT Start Time 1402    PT Stop Time 1450    PT Time Calculation (min) 48 min    Equipment Utilized During Treatment Gait belt    Activity Tolerance Patient tolerated treatment well    Behavior During Therapy WFL for tasks assessed/performed;Lability             Past Medical History:  Diagnosis Date   Acute H. pylori gastric ulcer    Coffee ground emesis    Diabetes mellitus (HCC)    Diabetic gastroparesis (HCC)    DKA (diabetic ketoacidosis) (HCC) 02/24/2021   Gastroparesis    GERD (gastroesophageal reflux disease)    Hypertension    Hyperthyroidism    Intractable nausea and vomiting 04/20/2021   Normocytic anemia 04/20/2020   Prolonged Q-T interval on ECG    Past Surgical History:  Procedure Laterality Date   AMPUTATION Bilateral 10/24/2022   Procedure: BILATERAL BELOW KNEE AMPUTATION;  Surgeon: Nadara Mustard, MD;  Location: [facility] OR;  Service: Orthopedics;  Laterality: Bilateral;   AMPUTATION Bilateral 10/08/2022   Procedure: BILATERAL LEG DEBRIDEMENT;  Surgeon: Nadara Mustard, MD;  Location: [facility] OR;  Service: Orthopedics;  Laterality: Bilateral;   AMPUTATION Bilateral 11/14/2022   Procedure: BILATERAL ABOVE KNEE AMPUTATION;  Surgeon: Nadara Mustard, MD;  Location: [facility] OR;  Service: Orthopedics;  Laterality: Bilateral;   AMPUTATION TOE Left 03/10/2018   Procedure: AMPUTATION FIFTH TOE;  Surgeon: Vivi Barrack, DPM;  Location: [facility];  Service: Podiatry;  Laterality: Left;   APPLICATION OF WOUND VAC Bilateral 10/12/2022   Procedure: APPLICATION OF WOUND  VAC BILATERAL LEGS;  Surgeon: Myrene Galas, MD;  Location: [facility] OR;  Service: Orthopedics;  Laterality: Bilateral;   APPLICATION OF WOUND VAC Bilateral 10/24/2022   Procedure: APPLICATION OF WOUND VAC;  Surgeon: Nadara Mustard, MD;  Location: [facility] OR;  Service: Orthopedics;  Laterality: Bilateral;   BIOPSY  01/28/2020   Procedure: BIOPSY;  Surgeon: Kathi Der, MD;  Location: [facility];  Service: Gastroenterology;;   BOTOX INJECTION  08/26/2021   Procedure: BOTOX INJECTION;  Surgeon: Sherrilyn Rist, MD;  Location: [facility];  Service: Gastroenterology;;   ESOPHAGOGASTRODUODENOSCOPY N/A 01/28/2020   Procedure: ESOPHAGOGASTRODUODENOSCOPY (EGD);  Surgeon: Kathi Der, MD;  Location: [facility];  Service: Gastroenterology;  Laterality: N/A;   ESOPHAGOGASTRODUODENOSCOPY (EGD) WITH PROPOFOL Left 09/08/2015   Procedure: ESOPHAGOGASTRODUODENOSCOPY (EGD) WITH PROPOFOL;  Surgeon: Willis Modena, MD;  Location: [facility] ENDOSCOPY;  Service: Endoscopy;  Laterality: Left;   ESOPHAGOGASTRODUODENOSCOPY (EGD) WITH PROPOFOL N/A 08/26/2021   Procedure: ESOPHAGOGASTRODUODENOSCOPY (EGD) WITH PROPOFOL;  Surgeon: Sherrilyn Rist, MD;  Location: [facility];  Service: Gastroenterology;  Laterality: N/A;   GASTRIC STIMULATOR IMPLANT SURGERY  06/2022   at [facility]   I & D EXTREMITY Bilateral 10/12/2022   Procedure: IRRIGATION AND  DEBRIDEMENT  BILATERAL LEGS;  Surgeon: Myrene Galas, MD;  Location: [facility] OR;  Service: Orthopedics;  Laterality: Bilateral;   I & D EXTREMITY Right 10/13/2022   Procedure: RIGHT THIGH WOUND VAC EXCHANGE;  Surgeon: Roby Lofts, MD;  Location: [facility] OR;  Service: Orthopedics;  Laterality: Right;   I & D EXTREMITY Bilateral 10/17/2022   Procedure: BILATERAL THIGH AND LEG DEBRIDEMENT, PARTIAL BILATERAL FOOT AMPUTATION;  Surgeon: Nadara Mustard, MD;  Location: [facility] OR;  Service: Orthopedics;  Laterality: Bilateral;   I & D EXTREMITY Bilateral 11/05/2022   Procedure: BILATERAL THIGH  DEBRIDEMENT;  Surgeon: Nadara Mustard, MD;  Location: [facility] OR;  Service: Orthopedics;  Laterality: Bilateral;   I & D EXTREMITY Bilateral 11/07/2022   Procedure: BILATERAL THIGH AND LEG DEBRIDEMENT;  Surgeon: Nadara Mustard, MD;  Location: [facility] OR;  Service: Orthopedics;  Laterality: Bilateral;   I & D EXTREMITY Bilateral 11/12/2022   Procedure: BILATERAL LEG DEBRIDEMENTS;  Surgeon: Nadara Mustard, MD;  Location: [facility] OR;  Service: Orthopedics;  Laterality: Bilateral;   IRRIGATION AND DEBRIDEMENT FOOT Bilateral 10/05/2022   Procedure: IRRIGATION AND DEBRIDEMENT FOOT;  Surgeon: [name], DPM;  Location: [facility];  Service: Orthopedics/Podiatry;  Laterality: Bilateral;   WISDOM TOOTH EXTRACTION     Patient Active Problem List   Diagnosis Date Noted   Influenza A 04/05/2023   Elevated lipase 04/05/2023   Esophagitis 03/24/2023   SIRS (systemic inflammatory response syndrome) (HCC) 03/02/2023   Fever 03/02/2023   Metabolic acidosis 03/02/2023   Hypertensive urgency 02/21/2023   Chronic pain 02/21/2023   Abscess of thigh 11/28/2022   C. 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   Hypokalemia 11/27/2022   Effusion, left knee 11/05/2022   MDD (major depressive disorder), recurrent episode, moderate (HCC) 10/16/2022   Necrotizing fasciitis of pelvic region and thigh (HCC) 10/08/2022   Necrotizing fasciitis of lower leg (HCC) 10/08/2022   Necrotizing fasciitis (HCC) 10/08/2022   Sepsis due to cellulitis (HCC) 10/03/2022   Cellulitis of lower extremity 10/03/2022   Multinodular goiter 06/18/2022   Malnutrition of moderate degree 04/18/2022   Intractable vomiting with nausea 04/17/2022   DKA (diabetic ketoacidosis) (HCC) 03/04/2022   Nausea vomiting and diarrhea 12/25/2021   Diabetic gastroparesis (HCC) 10/09/2021   Drug-seeking behavior 09/21/2021   Essential hypertension 08/25/2021   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) 08/25/2021    GERD without esophagitis 08/25/2021   Hematemesis    Prolonged Q-T interval on ECG    Nausea and vomiting 07/20/2021   Anemia of chronic disease 06/06/2021   Dehydration 06/04/2021   Hypomagnesemia 04/21/2021   Ulcerated, foot, left, with fat layer exposed (HCC) 04/20/2021   Uncontrolled type 1 diabetes mellitus with hyperglycemia, with long-term current use of insulin (HCC) 12/18/2020   DM type 1 (diabetes mellitus, type 1) (HCC) 12/18/2020   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) 11/09/2020   Diabetic retinopathy of both eyes associated with type 1 diabetes mellitus (HCC) 09/24/2020   Diabetic gastroparesis associated with type 1 diabetes mellitus (HCC) 08/12/2020   Acute kidney injury superimposed on chronic kidney disease (HCC) 07/25/2020   Generalized abdominal pain 07/23/2020   GERD (gastroesophageal reflux disease) 07/23/2020   Diabetic nephropathy associated with type 1 diabetes mellitus (HCC) 06/27/2020   Sepsis (HCC) 05/27/2020   HLD (hyperlipidemia) 05/23/2020   Sinus tachycardia 05/09/2020   Anemia 04/20/2020   Depression with anxiety 07/05/2019   Diabetic polyneuropathy associated with type 1 diabetes mellitus (HCC) 09/24/2017   Gastroparesis    Goiter 10/05/2009   DM2 (diabetes mellitus, type 2) (HCC) 10/05/2009   Hypertension associated with diabetes (HCC) 10/05/2009    ONSET DATE: 04/07/23 referral  REFERRING DIAG: [phone] (ICD-10-CM) - S/P AKA (above knee amputation) bilateral (HCC)   THERAPY DIAG:  Muscle weakness (generalized)  Other symptoms and signs involving the musculoskeletal system  Abnormal posture  Other abnormalities of gait and mobility  Rationale for Evaluation and Treatment: Rehabilitation  SUBJECTIVE:                                                                                                                                                                                             SUBJECTIVE  STATEMENT: She reports the doctor told her she has a stye and seasonal allergies impacting her eyes - she is being treated for both currently but reports her eyes are still bothering her.  She has had no falls or concerns since last visit.  Pt accompanied by: friend  PERTINENT HISTORY: DM, diabetic gastroparesis, GERD, HTN, hyperthyroidism  PAIN:  Are you having pain? No  VITALS: There were no vitals filed for this visit.  PRECAUTIONS: Fall; B diabetic retinopathy  PATIENT GOALS: "to be able to walk again"                                                                                                                              TREATMENT -Increased time and verbal cues for pt to don prosthetics - she dons 4-ply on RLE and 5-ply on LLE at onset of session.  PT provides minA to adjust fit of R socket and lanyard due to increased ply.  PT provides modA to adjust BOS in standing at ballet bar.  She has trouble initiating weight shifting at hips vs trunk -Holding upright with low bar vs high bar to shift more weight posteriorly into hips vs UE -Reaching laterally down low bar x5 each side > crossbody reaching into progressed ROM x5 each side -Practiced pull-to-stand w/ low bar vs high w/ pt better able to place BOS initially vs when pulling closer with high bar -Pt practices lateral stepping using low bar support to letter called w/ overhead placement of squig.  Performed x2 rounds.  Pt requires intermittent standing and  seated rest with time as pt reports frustration with process and tearfulness.  PT intermittently needed to adjust prosthetic foot placement. -Pt doffs legs once returned to chair per request  PATIENT EDUCATION: Education details: Continue HEP.  Discussed calling gym ahead of obtaining membership to discuss accessibility and availability of ballet bar for practice standing - pt is interested in setup similar to PT gym for safety. Person educated: Patient and friend Education  method: Medical illustrator Education comprehension: verbalized understanding and needs further education  HOME EXERCISE PROGRAM: -practice donning/doffing prosthetic Access Code: [email or web address] URL: [email or web address] Date: 04/20/2023 Prepared by: Merry Lofty  Exercises - Prone Hip Extension with Residual Limb (BKA)  - 1 x daily - 7 x weekly - 3 sets - 10 reps - Sidelying Hip Abduction (AKA)  - 1 x daily - 7 x weekly - 3 sets - 10 reps - Supine Piriformis Stretch with Residual Limb (AKA)  - 1 x daily - 7 x weekly - 3 sets - 10 reps  Do each exercise 1-2  times per day Do each exercise 10 repetitions Hold each exercise for 2 seconds to feel your location  AT SINK FIND YOUR MIDLINE POSITION AND PLACE FEET EQUAL DISTANCE FROM THE MIDLINE.  Try to find this position when standing still for activities.   USE TAPE ON FLOOR TO MARK THE MIDLINE POSITION. You also should try to feel with your limb pressure in socket.  You are trying to feel with limb what you used to feel with the bottom of your foot.  Side to Side Shift: Moving your hips only (not shoulders): move weight onto your left leg, HOLD/FEEL.  Move back to equal weight on each leg, HOLD/FEEL. Move weight onto your right leg, HOLD/FEEL. Move back to equal weight on each leg, HOLD/FEEL. Repeat.  Start with both hands on sink, progress to right hand only, then no hands.  Front to Back Shift: Moving your hips only (not shoulders): move your weight forward onto your toes, HOLD/FEEL. Move your weight back to equal Flat Foot on both legs, HOLD/FEEL. Move your weight back onto your heels, HOLD/FEEL. Move your weight back to equal on both legs, HOLD/FEEL. Repeat.   tart with both hands on sink, progress to right hand only, then no hands. Moving Cones / Cups: With equal weight on each leg: Hold on with one hand the first time, then progress to no hand supports. Move cups from one side of sink to the other. Place cups ~2" out  of your reach, progress to 10" beyond reach.  Place one hand in middle of sink and reach with other hand. Do both arms.  Then hover one hand and move cups with other hand. -just use a crossbody reach to end of safe weight shift ROM Overhead/Upward Reaching: alternated reaching up to top cabinets or ceiling if no cabinets present. Keep equal weight on each leg. Start with one hand support on counter while other hand reaches and progress to no hand support with reaching.  ace one hand in middle of sink and reach with other hand. Do both arms.  Then hover one hand and move cups with other hand.  5.   Looking Over Shoulders: With equal weight on each leg: alternate turning to look over your shoulders with one hand support on counter as needed.  Start with head motions only to look in front of shoulder, then even with shoulder and progress to looking behind you. To look to side, move head Eliseo Squires,  then shoulder on side looking pulls back, shift more weight to side looking and pull hip back. ace one hand in middle of sink and let go with other hand so your shoulder can pull back. Switch hands to look other way.   Then hover one hand and move cups with other hand.  6.  Stepping with leg that is not amputated:  Move items under cabinet out of your way. Shift your hips/pelvis so weight on prosthesis. SLOWLY step other leg so front of foot is in cabinet. Then step back to floor. - just hike the hip and place the foot back in the starting spot  GOALS: Goals reviewed with patient? Yes  SHORT TERM GOALS: Target date: 05/15/23  Pt will be independent with initial HEP for improved gait mechanics and balance  Baseline: to be updated, reports she has not been doing at home doesn't trust CNA Goal status: NOT MET  2.  Patient will ambulate at least 63ft with LRAD and no more than MinA to demonstrate improved mobility  Baseline: 8ft // bars + MinA, 10 feet with 2WW + minA Goal status: NOT met  3.  Patient will complete  sit <> stand with LRAD and no more than CGA for improved independence with transfers Baseline: MinA, required minA in today's session  Goal status: NOT met - required minA due to posterior instability  4.  Patient will complete floor transfer with assist as initial step of fall recovery  Baseline: unable to complete on eval; MinA Goal status: MET   LONG TERM GOALS: Target date: 07/10/23  Pt will be independent with final HEP for improved gait mechanics and balance  Baseline: to be updated Goal status: INITIAL  Patient will ambulate at least 45ft with LRAD, ModI to demonstrate improved mobility  Baseline: 57ft // bars + MinA Goal status: INITIAL  Patient will complete sit <> stand with LRAD. SBA for improved independence with transfers Baseline: MinA Goal status: REVISED  Patient will complete floor transfer without assist as initial step of fall recovery  Baseline: unable to complete on eval Goal status: INITIAL  5. Patient will don/doff prosthetics independently   Baseline: MaxA  Goal status: INITIAL  ASSESSMENT:  CLINICAL IMPRESSION: Ongoing focus of skilled PT on addressing static stability and decreasing UE assistance needed.  Pt continues to be challenged with prosthetic placement and standing weight shift possibly related to flare and space at top of current sockets.  She does better with lateral stepping task today and was able to progress to low ballet bar vs bicep pull on top bar to maintain upright.  PT planning to progress to modified ambulation with 2WW as able.  Continue per POC.  OBJECTIVE IMPAIRMENTS: Abnormal gait, decreased activity tolerance, decreased balance, decreased endurance, decreased knowledge of condition, decreased knowledge of use of DME, decreased strength, impaired vision/preception, and prosthetic dependency .   ACTIVITY LIMITATIONS: carrying, lifting, standing, squatting, transfers, hygiene/grooming, locomotion level, and caring for  others  PARTICIPATION LIMITATIONS: meal prep, cleaning, interpersonal relationship, driving, shopping, community activity, and occupation  PERSONAL FACTORS: Age, Behavior pattern, Past/current experiences, Social background, Time since onset of injury/illness/exacerbation, Transportation, and 3+ comorbidities: see above  are also affecting patient's functional outcome.   REHAB POTENTIAL: Good  CLINICAL DECISION MAKING: Stable/uncomplicated  EVALUATION COMPLEXITY: Low  PLAN:  PT FREQUENCY: 2x/week  PT DURATION: 12 weeks  PLANNED INTERVENTIONS: 97164- PT Re-evaluation, 97110-Therapeutic exercises, 97530- Therapeutic activity, [id number]- Neuromuscular re-education, 97535- Self Care, [id number]- Manual therapy, [id number]- Gait training, [phone]-  Prosthetic training, 11914- Aquatic Therapy, Patient/Family education, Balance training, Stair training, Scar mobilization, Vestibular training, Visual/preceptual remediation/compensation, DME instructions, and Wheelchair mobility training  PLAN FOR NEXT SESSION: Gait, balance, floor transfers  how is modified sink HEP?, balance recovery in standing/static stability w/ dec UE reliance, walking w/ 2WW (low bars)?   Sadie Haber, PT, DPT  05/28/2023, 6:17 PM

## 2023-05-28 NOTE — Therapy (Signed)
 OUTPATIENT OCCUPATIONAL THERAPY NEURO TREATMENT  Patient Name: Shermaine Rivet MRN: [id number] DOB:01-23-1991, 33 y.o., female Today's Date: 05/28/2023  PCP: Cristino Martes, NP  REFERRING PROVIDER: Nadara Mustard, MD  END OF SESSION:  OT End of Session - 05/28/23 1541     Visit Number 5    Number of Visits 17   including eval   Date for OT Re-Evaluation 06/26/23    Authorization Type [employer] Medicare Dual/Medicaid, covered 100%    Progress Note Due on Visit 10    OT Start Time 1452    OT Stop Time 1530    OT Time Calculation (min) 38 min    Activity Tolerance Patient tolerated treatment well    Behavior During Therapy WFL for tasks assessed/performed             Past Medical History:  Diagnosis Date   Acute H. pylori gastric ulcer    Coffee ground emesis    Diabetes mellitus (HCC)    Diabetic gastroparesis (HCC)    DKA (diabetic ketoacidosis) (HCC) 02/24/2021   Gastroparesis    GERD (gastroesophageal reflux disease)    Hypertension    Hyperthyroidism    Intractable nausea and vomiting 04/20/2021   Normocytic anemia 04/20/2020   Prolonged Q-T interval on ECG    Past Surgical History:  Procedure Laterality Date   AMPUTATION Bilateral 10/24/2022   Procedure: BILATERAL BELOW KNEE AMPUTATION;  Surgeon: Nadara Mustard, MD;  Location: [facility] OR;  Service: Orthopedics;  Laterality: Bilateral;   AMPUTATION Bilateral 10/08/2022   Procedure: BILATERAL LEG DEBRIDEMENT;  Surgeon: Nadara Mustard, MD;  Location: [facility] OR;  Service: Orthopedics;  Laterality: Bilateral;   AMPUTATION Bilateral 11/14/2022   Procedure: BILATERAL ABOVE KNEE AMPUTATION;  Surgeon: Nadara Mustard, MD;  Location: [facility] OR;  Service: Orthopedics;  Laterality: Bilateral;   AMPUTATION TOE Left 03/10/2018   Procedure: AMPUTATION FIFTH TOE;  Surgeon: Vivi Barrack, DPM;  Location: [facility];  Service: Podiatry;  Laterality: Left;   APPLICATION OF WOUND VAC Bilateral 10/12/2022   Procedure:  APPLICATION OF WOUND VAC BILATERAL LEGS;  Surgeon: Myrene Galas, MD;  Location: [facility] OR;  Service: Orthopedics;  Laterality: Bilateral;   APPLICATION OF WOUND VAC Bilateral 10/24/2022   Procedure: APPLICATION OF WOUND VAC;  Surgeon: Nadara Mustard, MD;  Location: [facility] OR;  Service: Orthopedics;  Laterality: Bilateral;   BIOPSY  01/28/2020   Procedure: BIOPSY;  Surgeon: Kathi Der, MD;  Location: [facility];  Service: Gastroenterology;;   BOTOX INJECTION  08/26/2021   Procedure: BOTOX INJECTION;  Surgeon: Sherrilyn Rist, MD;  Location: [facility];  Service: Gastroenterology;;   ESOPHAGOGASTRODUODENOSCOPY N/A 01/28/2020   Procedure: ESOPHAGOGASTRODUODENOSCOPY (EGD);  Surgeon: Kathi Der, MD;  Location: [facility];  Service: Gastroenterology;  Laterality: N/A;   ESOPHAGOGASTRODUODENOSCOPY (EGD) WITH PROPOFOL Left 09/08/2015   Procedure: ESOPHAGOGASTRODUODENOSCOPY (EGD) WITH PROPOFOL;  Surgeon: Willis Modena, MD;  Location: [facility] ENDOSCOPY;  Service: Endoscopy;  Laterality: Left;   ESOPHAGOGASTRODUODENOSCOPY (EGD) WITH PROPOFOL N/A 08/26/2021   Procedure: ESOPHAGOGASTRODUODENOSCOPY (EGD) WITH PROPOFOL;  Surgeon: Sherrilyn Rist, MD;  Location: [facility];  Service: Gastroenterology;  Laterality: N/A;   GASTRIC STIMULATOR IMPLANT SURGERY  06/2022   at [facility]   I & D EXTREMITY Bilateral 10/12/2022   Procedure: IRRIGATION AND  DEBRIDEMENT  BILATERAL LEGS;  Surgeon: Myrene Galas, MD;  Location: [facility] OR;  Service: Orthopedics;  Laterality: Bilateral;   I & D EXTREMITY Right 10/13/2022   Procedure: RIGHT THIGH WOUND VAC EXCHANGE;  Surgeon: Roby Lofts, MD;  Location: [facility] OR;  Service: Orthopedics;  Laterality: Right;   I & D EXTREMITY Bilateral 10/17/2022   Procedure: BILATERAL THIGH AND LEG DEBRIDEMENT, PARTIAL BILATERAL FOOT AMPUTATION;  Surgeon: Nadara Mustard, MD;  Location: [facility] OR;  Service: Orthopedics;  Laterality: Bilateral;   I & D EXTREMITY Bilateral 11/05/2022   Procedure:  BILATERAL THIGH DEBRIDEMENT;  Surgeon: Nadara Mustard, MD;  Location: [facility] OR;  Service: Orthopedics;  Laterality: Bilateral;   I & D EXTREMITY Bilateral 11/07/2022   Procedure: BILATERAL THIGH AND LEG DEBRIDEMENT;  Surgeon: Nadara Mustard, MD;  Location: [facility] OR;  Service: Orthopedics;  Laterality: Bilateral;   I & D EXTREMITY Bilateral 11/12/2022   Procedure: BILATERAL LEG DEBRIDEMENTS;  Surgeon: Nadara Mustard, MD;  Location: [facility] OR;  Service: Orthopedics;  Laterality: Bilateral;   IRRIGATION AND DEBRIDEMENT FOOT Bilateral 10/05/2022   Procedure: IRRIGATION AND DEBRIDEMENT FOOT;  Surgeon: [name], DPM;  Location: [facility];  Service: Orthopedics/Podiatry;  Laterality: Bilateral;   WISDOM TOOTH EXTRACTION     Patient Active Problem List   Diagnosis Date Noted   Influenza A 04/05/2023   Elevated lipase 04/05/2023   Esophagitis 03/24/2023   SIRS (systemic inflammatory response syndrome) (HCC) 03/02/2023   Fever 03/02/2023   Metabolic acidosis 03/02/2023   Hypertensive urgency 02/21/2023   Chronic pain 02/21/2023   Abscess of thigh 11/28/2022   C. 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   Hypokalemia 11/27/2022   Effusion, left knee 11/05/2022   MDD (major depressive disorder), recurrent episode, moderate (HCC) 10/16/2022   Necrotizing fasciitis of pelvic region and thigh (HCC) 10/08/2022   Necrotizing fasciitis of lower leg (HCC) 10/08/2022   Necrotizing fasciitis (HCC) 10/08/2022   Sepsis due to cellulitis (HCC) 10/03/2022   Cellulitis of lower extremity 10/03/2022   Multinodular goiter 06/18/2022   Malnutrition of moderate degree 04/18/2022   Intractable vomiting with nausea 04/17/2022   DKA (diabetic ketoacidosis) (HCC) 03/04/2022   Nausea vomiting and diarrhea 12/25/2021   Diabetic gastroparesis (HCC) 10/09/2021   Drug-seeking behavior 09/21/2021   Essential hypertension 08/25/2021   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) 08/25/2021   GERD without esophagitis 08/25/2021   Hematemesis    Prolonged Q-T interval on ECG    Nausea and vomiting 07/20/2021   Anemia of chronic disease 06/06/2021   Dehydration 06/04/2021   Hypomagnesemia 04/21/2021   Ulcerated, foot, left, with fat layer exposed (HCC) 04/20/2021   Uncontrolled type 1 diabetes mellitus with hyperglycemia, with long-term current use of insulin (HCC) 12/18/2020   DM type 1 (diabetes mellitus, type 1) (HCC) 12/18/2020   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) 11/09/2020   Diabetic retinopathy of both eyes associated with type 1 diabetes mellitus (HCC) 09/24/2020   Diabetic gastroparesis associated with type 1 diabetes mellitus (HCC) 08/12/2020   Acute kidney injury superimposed on chronic kidney disease (HCC) 07/25/2020   Generalized abdominal pain 07/23/2020   GERD (gastroesophageal reflux disease) 07/23/2020   Diabetic nephropathy associated with type 1 diabetes mellitus (HCC) 06/27/2020   Sepsis (HCC) 05/27/2020   HLD (hyperlipidemia) 05/23/2020   Sinus tachycardia 05/09/2020   Anemia 04/20/2020   Depression with anxiety 07/05/2019   Diabetic polyneuropathy associated with type 1 diabetes mellitus (HCC) 09/24/2017   Gastroparesis    Goiter 10/05/2009   DM2 (diabetes mellitus, type 2) (HCC) 10/05/2009   Hypertension associated with diabetes (HCC) 10/05/2009  ONSET DATE: 04/16/23 (referral date)  REFERRING DIAG: [phone] (ICD-10-CM) - S/P AKA (above knee amputation) bilateral   THERAPY DIAG:  Muscle weakness (generalized)  Other symptoms and signs involving the musculoskeletal system  Rationale for Evaluation and Treatment: Rehabilitation  SUBJECTIVE:   SUBJECTIVE STATEMENT: Pt reported no acute changes. Pt reported intent to attend support group tonight.    Pt accompanied by: self,  friend Kenard Gower)  PERTINENT HISTORY: DM, diabetic gastroparesis, GERD, HTN, hyperthyroidism   PRECAUTIONS: Fall;  B diabetic retinopathy   WEIGHT BEARING RESTRICTIONS: No  PAIN:  Are you having pain? No  FALLS: Has patient fallen in last 6 months? Yes. Number of falls 1 - around Thanksgiving when attempting to transfer to a second w/c  LIVING ENVIRONMENT:  Lives with: lives alone Lives in: House/apartment Stairs: No Has following equipment at home: Wheelchair (manual) and shower chair; aide 7 days/week for 4 hours  PLOF: Independent with basic ADLs, community mobility: assistance from friends  PATIENT GOALS: "I want to work on standing and balance"  OBJECTIVE:  Note: Objective measures were completed at Evaluation unless otherwise noted.  Vitals:   05/28/23 1453  BP: 134/84  Pulse: 99      HAND DOMINANCE: Right  ADLs: Overall ADLs: ind, cautious Eating: ind Grooming: ind UB Dressing: ind, some difficulty with donning standard bras though no difficulty with donning sports bras LB Dressing: ind Toileting: ind Bathing: prefers at least direct or distant supervision though has completed ind - "I'm scared because shower chair moves a little bit because it's a little bigger than the tub so I feel better when someone is there." Tub Shower transfers: tub transfer bench Equipment: tub transfer bench, tub/shower  IADLs: Meal Prep: difficulty cutting food for meal prep/cooking when attempting to stand. Pt reported typically using microwave meals and sometimes using stove/oven. Pt reported trying to not be afraid of doing "normal stuff" again.  Handwriting:  Pt reported no need for handwriting, decreased vision.  Pt reported using voice text to communicate using phone.  MOBILITY STATUS:  w/c, reported standing in prosthetics only when coming to therapy though will attempt to stand at home with another person to supervise  soon  POSTURE COMMENTS:  rounded shoulders and forward head  ACTIVITY TOLERANCE: Activity tolerance: about the same as PLOF, more tired after standing tasks though  only for "just 10 minutes"  FUNCTIONAL OUTCOME MEASURES: PSFS: 6.7   Total score = sum of the activity scores/number of activities Minimum detectable change (90%CI) for average score = 2 points Minimum detectable change (90%CI) for single activity score = 3 points  UPPER EXTREMITY ROM:    Active ROM Right eval Left eval  Shoulder flexion [facility]  Shoulder abduction    Shoulder adduction    Shoulder extension    Shoulder internal rotation    Shoulder external rotation    Elbow flexion    Elbow extension    Wrist flexion    Wrist extension    Wrist ulnar deviation    Wrist radial deviation    Wrist pronation    Wrist supination    (Blank rows = not tested)  UPPER EXTREMITY MMT:     MMT Right eval Left eval  Shoulder flexion    Shoulder abduction    Shoulder adduction    Shoulder extension    Shoulder internal rotation    Shoulder external rotation    Middle trapezius    Lower trapezius    Elbow flexion    Elbow  extension    Wrist flexion    Wrist extension    Wrist ulnar deviation    Wrist radial deviation    Wrist pronation    Wrist supination    (Blank rows = not tested)  HAND FUNCTION: Grip strength: Right: 57 lbs; Left: 39 lbs  Pt reported some difficulty with tasks requiring strength of L side. Pt reported noticing weakness of L side compared to R side.  COORDINATION: 9 Hole Peg test: Right: 37 sec; Left: 33 sec, decreased speed secondary to visual deficits  SENSATION: Pt denied numbness/tingling/discomfort of BLE and BUE.  EDEMA: R eyelid swollen.  MUSCLE TONE: RUE: Within functional limits and LUE: Within functional limits  COGNITION: Overall cognitive status: Within functional limits for tasks assessed  VISION: Subjective report: Corrective lens no longer help, low vision per pt, better seeing close up. Pt reported some peripheral vision L side, less peripheral vision R side. Baseline vision: B diabetic retinopathy: "it's low."  Visual  history:  B diabetic retinopathy  VISION ASSESSMENT: Visual tracking: Difficulty tracking black pen. Improved ability to track brightly colored marker to all 4 quadrants.  PERCEPTION: Not tested  PRAXIS: Not tested  OBSERVATIONS: Pt in w/c without prosthetics though with BLE prosthetics available nearby. Pt pleasant with decreased vision secondary to B retinopathy. Pt's friend assisted pt with w/c mobility today.                                                                                                                       TREATMENT:    Self-Care Vital signs assessed, see vital signs. BP within therapeutic parameters, therefore tasks continued.  OT educated pt and friend on meal prep safety, A/E options, and adaptive strategies. OT educated pt and friend on cut-resistant gloves, hand chopper/dicer, using food preparation surface of lower height, and adaptive cutting board. Pt acknowledged understanding.  Neuro Re-Ed OT initiated red theraband HEP - to improve LUE grip strength, to improve LUE strengthening, to improve core strength, to improve attention to upright seated posture, for gross motor coordination. Pt benefited from mod v/c for upright seated posture and keeping neck in alignment d/t tendency for forward head posture.  Access Code: [email or web address] URL: [email or web address] Date: 05/28/2023 Prepared by: Carilyn Goodpasture  Exercises - Seated Scapular Retraction  - 2 x daily - 1 sets - 10 reps - Seated Elbow Flexion with Self-Anchored Resistance  - 2 x daily - 1 sets - 10 reps - Seated Elbow Extension with Self-Anchored Resistance  - 2 x daily - 1 sets - 10 reps - Seated Bilateral Shoulder External Rotation with Resistance  - 2 x daily - 1 sets - 10 reps - Seated Shoulder Flexion with Self-Anchored Resistance  - 2 x daily - 1 sets - 10 reps  OT reviewed vision compensation strategies of magnification and text-to-speech options using pt's phone to look at [employer]. With extra time, pt returned demo with min to mod v/c then fading v/c.  PATIENT EDUCATION: Education  details: see today's tx above Person educated: Patient and friend Education method: Explanation, Demonstration, Verbal cues, and Handouts Education comprehension: verbalized understanding, returned demonstration, verbal cues required, and needs further education  HOME EXERCISE PROGRAM: 05/18/2023: putty and vision strategies 05/25/2023: O&M training 05/28/23 - red theraband LUE - Access Code: [email or web address]  GOALS: Goals reviewed with patient? Yes  SHORT TERM GOALS: Target date: 05/29/23  Pt will recall or demo at least 2 visual compensation strategies Baseline: impaired vision secondary to diabetic retinopathy 05/21/23 - Pt ind recalled using line guide, turning head to look to R, using finger to visually track information.  Goal status: MET  2.  Pt will verbalize or demo understanding of safe simulated shower transfers modI with A/E and adaptive strategies PRN. Baseline: Pt ind though fearful and prefers supervision during showers and shower transfers d/t unstable shower chair/bench. Pt has tub transfer bench and transfer board available.  05/28/23 - Pt reported shower transfers are "easy." Pt reported going slow and feeling confident with transfers. Goal status: MET  3.  Pt will ind demo understanding of LUE HEP. Baseline: New to outpt OT 05/21/23 - Pt returned demo of theraputty.  Goal status: MET  4. Pt will verbalize understanding of community resources for individuals with amputations.  Baseline: new to outpt OT  05/21/23 - Pt verbalized understanding of resources.  Goal status: MET  LONG TERM GOALS: Target date: 06/26/23  Pt will ind demo understanding of updated HEP. Baseline: new to outpt OT Goal status: INITIAL  2.  Patient will report at least two-point increase in average PSFS score or at least three-point increase in a single activity score indicating functionally  significant improvement given minimum detectable change using BLE prosthetics PRN. Baseline: 6.7 total score (See above for individual activity scores) without use of BLE prosthetics.  Goal status: INITIAL  3.  Pt will demo at least 45 lbs LUE grip strength as needed to open jars/containers. Baseline: Grip strength: Right: 57 lbs; Left: 39 lbs 05/25/2023: 38 lbs Goal status: INITIAL  4.  Pt will complete simple meal prep tasks modI using BLE prosthetics as able. Baseline: pt beginning to trial use of BLE prosthetics. Pt reported difficulty with meal prep tasks currently. Goal status: INITIAL  ASSESSMENT:  CLINICAL IMPRESSION: Pt met 1 STG today. Pt tolerated tasks well. Pt benefited from v/c for seated posture d/t pt frequently demo'ing forward head flex.  Pt would benefit from skilled OT services to address deficits and increase overall independence.  PERFORMANCE DEFICITS: in functional skills including ADLs, IADLs, coordination, dexterity, proprioception, edema, strength, flexibility, Fine motor control, Gross motor control, mobility, balance, body mechanics, endurance, decreased knowledge of use of DME, vision, and UE functional use, cognitive skills including energy/drive, and psychosocial skills including environmental adaptation.   IMPAIRMENTS: are limiting patient from ADLs, IADLs, rest and sleep, work, leisure, and social participation.   CO-MORBIDITIES: may have co-morbidities  that affects occupational performance. Patient will benefit from skilled OT to address above impairments and improve overall function.  REHAB POTENTIAL: Good  PLAN:  OT FREQUENCY: 2x/week  OT DURATION: 8 weeks  PLANNED INTERVENTIONS: 97168 OT Re-evaluation, 97535 self care/ADL training, 19147 therapeutic exercise, 97530 therapeutic activity, 97112 neuromuscular re-education, 97140 manual therapy, 97035 ultrasound, 97018 paraffin, 82956 fluidotherapy, 97010 moist heat, 97010 cryotherapy, 97034 contrast  bath, 97760 Orthotics management and training, 21308 Splinting (initial encounter), [id number] Subsequent splinting/medication, passive range of motion, functional mobility training, visual/perceptual remediation/compensation, energy conservation, patient/family education, and DME and/or AE instructions  RECOMMENDED  OTHER SERVICES: PT eval already completed  CONSULTED AND AGREED WITH PLAN OF CARE: Patient  PLAN FOR NEXT SESSION:  Monitor per medical event on 05/14/23  Discuss meal prep safety and A/E options - lower surface for meal preparation, trial adapted cutting board with rocker knife or butter knife, review cut-resistant gloves Practice standing with BLE prosthetics - incorporate functional tasks as able   Wynetta Emery, OT 05/28/2023, 3:54 PM

## 2023-06-01 ENCOUNTER — Ambulatory Visit: Admitting: Occupational Therapy

## 2023-06-01 ENCOUNTER — Ambulatory Visit: Payer: 59

## 2023-06-01 DIAGNOSIS — R2689 Other abnormalities of gait and mobility: Secondary | ICD-10-CM

## 2023-06-01 DIAGNOSIS — R293 Abnormal posture: Secondary | ICD-10-CM

## 2023-06-01 DIAGNOSIS — M6281 Muscle weakness (generalized): Secondary | ICD-10-CM

## 2023-06-01 DIAGNOSIS — R29898 Other symptoms and signs involving the musculoskeletal system: Secondary | ICD-10-CM

## 2023-06-01 NOTE — Therapy (Signed)
 OUTPATIENT OCCUPATIONAL THERAPY NEURO TREATMENT  Patient Name: Kelli Hudson MRN: [id number] DOB:Mar 27, 1990, 33 y.o., female Today's Date: 06/01/2023  PCP: Vevelyn Gowers, NP  REFERRING PROVIDER: Timothy Ford, MD  END OF SESSION:  OT End of Session - 06/01/23 1712     Visit Number 6    Number of Visits 17   including eval   Date for OT Re-Evaluation 06/26/23    Authorization Type [employer] Medicare Dual/Medicaid, covered 100%    Progress Note Due on Visit 10    OT Start Time 1404    OT Stop Time 1445    OT Time Calculation (min) 41 min    Activity Tolerance Patient tolerated treatment well    Behavior During Therapy WFL for tasks assessed/performed              Past Medical History:  Diagnosis Date   Acute H. pylori gastric ulcer    Coffee ground emesis    Diabetes mellitus (HCC)    Diabetic gastroparesis (HCC)    DKA (diabetic ketoacidosis) (HCC) 02/24/2021   Gastroparesis    GERD (gastroesophageal reflux disease)    Hypertension    Hyperthyroidism    Intractable nausea and vomiting 04/20/2021   Normocytic anemia 04/20/2020   Prolonged Q-T interval on ECG    Past Surgical History:  Procedure Laterality Date   AMPUTATION Bilateral 10/24/2022   Procedure: BILATERAL BELOW KNEE AMPUTATION;  Surgeon: Timothy Ford, MD;  Location: [facility] OR;  Service: Orthopedics;  Laterality: Bilateral;   AMPUTATION Bilateral 10/08/2022   Procedure: BILATERAL LEG DEBRIDEMENT;  Surgeon: Timothy Ford, MD;  Location: [facility] OR;  Service: Orthopedics;  Laterality: Bilateral;   AMPUTATION Bilateral 11/14/2022   Procedure: BILATERAL ABOVE KNEE AMPUTATION;  Surgeon: Timothy Ford, MD;  Location: [facility] OR;  Service: Orthopedics;  Laterality: Bilateral;   AMPUTATION TOE Left 03/10/2018   Procedure: AMPUTATION FIFTH TOE;  Surgeon: Charity Conch, DPM;  Location: [facility];  Service: Podiatry;  Laterality: Left;   APPLICATION OF WOUND VAC Bilateral 10/12/2022   Procedure:  APPLICATION OF WOUND VAC BILATERAL LEGS;  Surgeon: Hardy Lia, MD;  Location: [facility] OR;  Service: Orthopedics;  Laterality: Bilateral;   APPLICATION OF WOUND VAC Bilateral 10/24/2022   Procedure: APPLICATION OF WOUND VAC;  Surgeon: Timothy Ford, MD;  Location: [facility] OR;  Service: Orthopedics;  Laterality: Bilateral;   BIOPSY  01/28/2020   Procedure: BIOPSY;  Surgeon: Felecia Hopper, MD;  Location: [facility];  Service: Gastroenterology;;   BOTOX INJECTION  08/26/2021   Procedure: BOTOX INJECTION;  Surgeon: Albertina Hugger, MD;  Location: [facility];  Service: Gastroenterology;;   ESOPHAGOGASTRODUODENOSCOPY N/A 01/28/2020   Procedure: ESOPHAGOGASTRODUODENOSCOPY (EGD);  Surgeon: Felecia Hopper, MD;  Location: [facility];  Service: Gastroenterology;  Laterality: N/A;   ESOPHAGOGASTRODUODENOSCOPY (EGD) WITH PROPOFOL Left 09/08/2015   Procedure: ESOPHAGOGASTRODUODENOSCOPY (EGD) WITH PROPOFOL;  Surgeon: Evangeline Hilts, MD;  Location: [facility] ENDOSCOPY;  Service: Endoscopy;  Laterality: Left;   ESOPHAGOGASTRODUODENOSCOPY (EGD) WITH PROPOFOL N/A 08/26/2021   Procedure: ESOPHAGOGASTRODUODENOSCOPY (EGD) WITH PROPOFOL;  Surgeon: Albertina Hugger, MD;  Location: [facility];  Service: Gastroenterology;  Laterality: N/A;   GASTRIC STIMULATOR IMPLANT SURGERY  06/2022   at [facility]   I & D EXTREMITY Bilateral 10/12/2022   Procedure: IRRIGATION AND  DEBRIDEMENT  BILATERAL LEGS;  Surgeon: Hardy Lia, MD;  Location: [facility] OR;  Service: Orthopedics;  Laterality: Bilateral;   I & D EXTREMITY Right 10/13/2022   Procedure: RIGHT THIGH WOUND VAC  EXCHANGE;  Surgeon: Laneta Pintos, MD;  Location: [facility] OR;  Service: Orthopedics;  Laterality: Right;   I & D EXTREMITY Bilateral 10/17/2022   Procedure: BILATERAL THIGH AND LEG DEBRIDEMENT, PARTIAL BILATERAL FOOT AMPUTATION;  Surgeon: Timothy Ford, MD;  Location: [facility] OR;  Service: Orthopedics;  Laterality: Bilateral;   I & D EXTREMITY Bilateral 11/05/2022   Procedure:  BILATERAL THIGH DEBRIDEMENT;  Surgeon: Timothy Ford, MD;  Location: [facility] OR;  Service: Orthopedics;  Laterality: Bilateral;   I & D EXTREMITY Bilateral 11/07/2022   Procedure: BILATERAL THIGH AND LEG DEBRIDEMENT;  Surgeon: Timothy Ford, MD;  Location: [facility] OR;  Service: Orthopedics;  Laterality: Bilateral;   I & D EXTREMITY Bilateral 11/12/2022   Procedure: BILATERAL LEG DEBRIDEMENTS;  Surgeon: Timothy Ford, MD;  Location: [facility] OR;  Service: Orthopedics;  Laterality: Bilateral;   IRRIGATION AND DEBRIDEMENT FOOT Bilateral 10/05/2022   Procedure: IRRIGATION AND DEBRIDEMENT FOOT;  Surgeon: Dot Gazella, DPM;  Location: [facility]S;  Service: Orthopedics/Podiatry;  Laterality: Bilateral;   WISDOM TOOTH EXTRACTION     Patient Active Problem List   Diagnosis Date Noted   Influenza A 04/05/2023   Elevated lipase 04/05/2023   Esophagitis 03/24/2023   SIRS (systemic inflammatory response syndrome) (HCC) 03/02/2023   Fever 03/02/2023   Metabolic acidosis 03/02/2023   Hypertensive urgency 02/21/2023   Chronic pain 02/21/2023   Abscess of thigh 11/28/2022   C. 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   Hypokalemia 11/27/2022   Effusion, left knee 11/05/2022   MDD (major depressive disorder), recurrent episode, moderate (HCC) 10/16/2022   Necrotizing fasciitis of pelvic region and thigh (HCC) 10/08/2022   Necrotizing fasciitis of lower leg (HCC) 10/08/2022   Necrotizing fasciitis (HCC) 10/08/2022   Sepsis due to cellulitis (HCC) 10/03/2022   Cellulitis of lower extremity 10/03/2022   Multinodular goiter 06/18/2022   Malnutrition of moderate degree 04/18/2022   Intractable vomiting with nausea 04/17/2022   DKA (diabetic ketoacidosis) (HCC) 03/04/2022   Nausea vomiting and diarrhea 12/25/2021   Diabetic gastroparesis (HCC) 10/09/2021   Drug-seeking behavior 09/21/2021   Essential hypertension 08/25/2021   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) 08/25/2021   GERD without esophagitis 08/25/2021   Hematemesis    Prolonged Q-T interval on ECG    Nausea and vomiting 07/20/2021   Anemia of chronic disease 06/06/2021   Dehydration 06/04/2021   Hypomagnesemia 04/21/2021   Ulcerated, foot, left, with fat layer exposed (HCC) 04/20/2021   Uncontrolled type 1 diabetes mellitus with hyperglycemia, with long-term current use of insulin (HCC) 12/18/2020   DM type 1 (diabetes mellitus, type 1) (HCC) 12/18/2020   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) 11/09/2020   Diabetic retinopathy of both eyes associated with type 1 diabetes mellitus (HCC) 09/24/2020   Diabetic gastroparesis associated with type 1 diabetes mellitus (HCC) 08/12/2020   Acute kidney injury superimposed on chronic kidney disease (HCC) 07/25/2020   Generalized abdominal pain 07/23/2020   GERD (gastroesophageal reflux disease) 07/23/2020   Diabetic nephropathy associated with type 1 diabetes mellitus (HCC) 06/27/2020   Sepsis (HCC) 05/27/2020   HLD (hyperlipidemia) 05/23/2020   Sinus tachycardia 05/09/2020   Anemia 04/20/2020   Depression with anxiety 07/05/2019   Diabetic polyneuropathy associated with type 1 diabetes mellitus (HCC) 09/24/2017   Gastroparesis    Goiter 10/05/2009   DM2 (diabetes mellitus, type 2) (HCC) 10/05/2009   Hypertension associated with diabetes (HCC) 10/05/2009  ONSET DATE: 04/16/23 (referral date)  REFERRING DIAG: [phone] (ICD-10-CM) - S/P AKA (above knee amputation) bilateral   THERAPY DIAG:  Muscle weakness (generalized)  Other symptoms and signs involving the musculoskeletal system  Rationale for Evaluation and Treatment: Rehabilitation  SUBJECTIVE:   SUBJECTIVE STATEMENT: Pt reported no acute changes.   Pt accompanied by: self,  friend Kenard Gower)  PERTINENT HISTORY: DM, diabetic gastroparesis, GERD, HTN, hyperthyroidism   PRECAUTIONS: Fall; B diabetic retinopathy   WEIGHT BEARING  RESTRICTIONS: No  PAIN:  Are you having pain? No  FALLS: Has patient fallen in last 6 months? Yes. Number of falls 1 - around Thanksgiving when attempting to transfer to a second w/c  LIVING ENVIRONMENT:  Lives with: lives alone Lives in: House/apartment Stairs: No Has following equipment at home: Wheelchair (manual) and shower chair; aide 7 days/week for 4 hours  PLOF: Independent with basic ADLs, community mobility: assistance from friends  PATIENT GOALS: "I want to work on standing and balance"  OBJECTIVE:  Note: Objective measures were completed at Evaluation unless otherwise noted.  There were no vitals filed for this visit.     HAND DOMINANCE: Right  ADLs: Overall ADLs: ind, cautious Eating: ind Grooming: ind UB Dressing: ind, some difficulty with donning standard bras though no difficulty with donning sports bras LB Dressing: ind Toileting: ind Bathing: prefers at least direct or distant supervision though has completed ind - "I'm scared because shower chair moves a little bit because it's a little bigger than the tub so I feel better when someone is there." Tub Shower transfers: tub transfer bench Equipment: tub transfer bench, tub/shower  IADLs: Meal Prep: difficulty cutting food for meal prep/cooking when attempting to stand. Pt reported typically using microwave meals and sometimes using stove/oven. Pt reported trying to not be afraid of doing "normal stuff" again.  Handwriting:  Pt reported no need for handwriting, decreased vision.  Pt reported using voice text to communicate using phone.  MOBILITY STATUS:  w/c, reported standing in prosthetics only when coming to therapy though will attempt to stand at home with another person to supervise  soon  POSTURE COMMENTS:  rounded shoulders and forward head  ACTIVITY TOLERANCE: Activity tolerance: about the same as PLOF, more tired after standing tasks though only for "just 10 minutes"  FUNCTIONAL OUTCOME  MEASURES: PSFS: 6.7   Total score = sum of the activity scores/number of activities Minimum detectable change (90%CI) for average score = 2 points Minimum detectable change (90%CI) for single activity score = 3 points  UPPER EXTREMITY ROM:    Active ROM Right eval Left eval  Shoulder flexion [facility]  Shoulder abduction    Shoulder adduction    Shoulder extension    Shoulder internal rotation    Shoulder external rotation    Elbow flexion    Elbow extension    Wrist flexion    Wrist extension    Wrist ulnar deviation    Wrist radial deviation    Wrist pronation    Wrist supination    (Blank rows = not tested)  UPPER EXTREMITY MMT:     MMT Right eval Left eval  Shoulder flexion    Shoulder abduction    Shoulder adduction    Shoulder extension    Shoulder internal rotation    Shoulder external rotation    Middle trapezius    Lower trapezius    Elbow flexion    Elbow extension    Wrist flexion    Wrist extension  Wrist ulnar deviation    Wrist radial deviation    Wrist pronation    Wrist supination    (Blank rows = not tested)  HAND FUNCTION: Grip strength: Right: 57 lbs; Left: 39 lbs  Pt reported some difficulty with tasks requiring strength of L side. Pt reported noticing weakness of L side compared to R side.  COORDINATION: 9 Hole Peg test: Right: 37 sec; Left: 33 sec, decreased speed secondary to visual deficits  SENSATION: Pt denied numbness/tingling/discomfort of BLE and BUE.  EDEMA: R eyelid swollen.  MUSCLE TONE: RUE: Within functional limits and LUE: Within functional limits  COGNITION: Overall cognitive status: Within functional limits for tasks assessed  VISION: Subjective report: Corrective lens no longer help, low vision per pt, better seeing close up. Pt reported some peripheral vision L side, less peripheral vision R side. Baseline vision: B diabetic retinopathy: "it's low."  Visual history:  B diabetic retinopathy  VISION  ASSESSMENT: Visual tracking: Difficulty tracking black pen. Improved ability to track brightly colored marker to all 4 quadrants.  PERCEPTION: Not tested  PRAXIS: Not tested  OBSERVATIONS: Pt in w/c without prosthetics though with BLE prosthetics available nearby. Pt pleasant with decreased vision secondary to B retinopathy. Pt's friend assisted pt with w/c mobility today.                                                                                                                       TREATMENT:    Self-Care Simulated meal prep - cutting theraputty - to improve ind and efficiency for meal prep tasks. OT educated pt on A/E, adaptive strategies, and vision compensation strategies for cutting food items for meal prep. Pt returned demo. OT showed examples online of the following A/E options: adapted cutting board, rocker knife, vegetable chopper, and cut-resistant gloves. Pt and friend acknowledged understanding.   OT educated pt on safety, hand positioning to avoid LUE proximity to cutting device, option tray for w/c, and lower food preparation surface or table. Pt and friend acknowledged understanding.   Pt's friend asked about support group for individuals with amputations. OT recommended to pt and friend to reach out to support group leader with any questions or concerns. Pt and friend acknowledged understanding.  TherAct Functional standing with prosthetics, CGA with gait belt. Pt used at least one UE for support at any given time during task - placing and removing resistive clilps (yellow, green, red), 2 sets - to improve functional standing balance for funtional tasks, for BUE functional reach and strengthening, for BUE FM coordination. Pt ind donned/doffed prosthetics.   PATIENT EDUCATION: Education details: see today's tx above Person educated: Patient and friend Education method: Explanation, Demonstration, Verbal cues, and Handouts Education comprehension: verbalized  understanding, returned demonstration, verbal cues required, and needs further education  HOME EXERCISE PROGRAM: 05/18/2023: putty and vision strategies 05/25/2023: O&M training 05/28/23 - red theraband LUE - Access Code: [email or web address]  GOALS: Goals reviewed with patient? Yes  SHORT TERM GOALS: Target date: 05/29/23  Pt will recall or demo at least 2 visual compensation strategies Baseline: impaired vision secondary to diabetic retinopathy 05/21/23 - Pt ind recalled using line guide, turning head to look to R, using finger to visually track information.  Goal status: MET  2.  Pt will verbalize or demo understanding of safe simulated shower transfers modI with A/E and adaptive strategies PRN. Baseline: Pt ind though fearful and prefers supervision during showers and shower transfers d/t unstable shower chair/bench. Pt has tub transfer bench and transfer board available.  05/28/23 - Pt reported shower transfers are "easy." Pt reported going slow and feeling confident with transfers. Goal status: MET  3.  Pt will ind demo understanding of LUE HEP. Baseline: New to outpt OT 05/21/23 - Pt returned demo of theraputty.  Goal status: MET  4. Pt will verbalize understanding of community resources for individuals with amputations.  Baseline: new to outpt OT  05/21/23 - Pt verbalized understanding of resources.  Goal status: MET  LONG TERM GOALS: Target date: 06/26/23  Pt will ind demo understanding of updated HEP. Baseline: new to outpt OT Goal status: in progress  2.  Patient will report at least two-point increase in average PSFS score or at least three-point increase in a single activity score indicating functionally significant improvement given minimum detectable change using BLE prosthetics PRN. Baseline: 6.7 total score (See above for individual activity scores) without use of BLE prosthetics.  Goal status: in progress  3.  Pt will demo at least 45 lbs LUE grip strength as needed to open  jars/containers. Baseline: Grip strength: Right: 57 lbs; Left: 39 lbs 05/25/2023: 38 lbs Goal status: in progress  4.  Pt will complete simple meal prep tasks modI using BLE prosthetics as able. Baseline: pt beginning to trial use of BLE prosthetics. Pt reported difficulty with meal prep tasks currently. Goal status: in progress  ASSESSMENT:  CLINICAL IMPRESSION: Pt tolerated tasks well. Pt demo'd fair standing tolerance using BLE prosthetics for functional standing tasks though uses at least 1 UE for support at any given time during task. Pt would benefit from skilled OT services to address deficits and increase overall independence.  PERFORMANCE DEFICITS: in functional skills including ADLs, IADLs, coordination, dexterity, proprioception, edema, strength, flexibility, Fine motor control, Gross motor control, mobility, balance, body mechanics, endurance, decreased knowledge of use of DME, vision, and UE functional use, cognitive skills including energy/drive, and psychosocial skills including environmental adaptation.   IMPAIRMENTS: are limiting patient from ADLs, IADLs, rest and sleep, work, leisure, and social participation.   CO-MORBIDITIES: may have co-morbidities  that affects occupational performance. Patient will benefit from skilled OT to address above impairments and improve overall function.  REHAB POTENTIAL: Good  PLAN:  OT FREQUENCY: 2x/week  OT DURATION: 8 weeks  PLANNED INTERVENTIONS: 97168 OT Re-evaluation, 97535 self care/ADL training, 16109 therapeutic exercise, 97530 therapeutic activity, 97112 neuromuscular re-education, 97140 manual therapy, 97035 ultrasound, 97018 paraffin, 60454 fluidotherapy, 97010 moist heat, 97010 cryotherapy, 97034 contrast bath, 97760 Orthotics management and training, 09811 Splinting (initial encounter), [id number] Subsequent splinting/medication, passive range of motion, functional mobility training, visual/perceptual remediation/compensation,  energy conservation, patient/family education, and DME and/or AE instructions  RECOMMENDED OTHER SERVICES: PT eval already completed  CONSULTED AND AGREED WITH PLAN OF CARE: Patient  PLAN FOR NEXT SESSION:  Monitor per medical event on 05/14/23  Assess LTG Did pt obtain any meal prep A/E options? Practice standing with BLE prosthetics - incorporate functional tasks as able   Wynetta Emery, OT 06/01/2023, 5:20  PM

## 2023-06-01 NOTE — Therapy (Signed)
 OUTPATIENT PHYSICAL THERAPY NEURO TREATMENT   Patient Name: Kelli Hudson MRN: [id number] DOB:02-Mar-1990, 33 y.o., female Today's Date: 06/01/2023   PCP: Vevelyn Gowers, NP REFERRING PROVIDER: Gearldean Keepers, MD  END OF SESSION:  PT End of Session - 06/01/23 1321     Visit Number 13    Number of Visits 25    Date for PT Re-Evaluation 07/10/23    Authorization Type Medicare/Medicaid    Progress Note Due on Visit 20    PT Start Time 1319   patient late   PT Stop Time 1400    PT Time Calculation (min) 41 min    Equipment Utilized During Treatment Gait belt    Activity Tolerance Patient tolerated treatment well    Behavior During Therapy WFL for tasks assessed/performed             Past Medical History:  Diagnosis Date   Acute H. pylori gastric ulcer    Coffee ground emesis    Diabetes mellitus (HCC)    Diabetic gastroparesis (HCC)    DKA (diabetic ketoacidosis) (HCC) 02/24/2021   Gastroparesis    GERD (gastroesophageal reflux disease)    Hypertension    Hyperthyroidism    Intractable nausea and vomiting 04/20/2021   Normocytic anemia 04/20/2020   Prolonged Q-T interval on ECG    Past Surgical History:  Procedure Laterality Date   AMPUTATION Bilateral 10/24/2022   Procedure: BILATERAL BELOW KNEE AMPUTATION;  Surgeon: Timothy Ford, MD;  Location: [facility] OR;  Service: Orthopedics;  Laterality: Bilateral;   AMPUTATION Bilateral 10/08/2022   Procedure: BILATERAL LEG DEBRIDEMENT;  Surgeon: Timothy Ford, MD;  Location: [facility] OR;  Service: Orthopedics;  Laterality: Bilateral;   AMPUTATION Bilateral 11/14/2022   Procedure: BILATERAL ABOVE KNEE AMPUTATION;  Surgeon: Timothy Ford, MD;  Location: [facility] OR;  Service: Orthopedics;  Laterality: Bilateral;   AMPUTATION TOE Left 03/10/2018   Procedure: AMPUTATION FIFTH TOE;  Surgeon: Charity Conch, DPM;  Location: [facility];  Service: Podiatry;  Laterality: Left;   APPLICATION OF WOUND VAC Bilateral  10/12/2022   Procedure: APPLICATION OF WOUND VAC BILATERAL LEGS;  Surgeon: Hardy Lia, MD;  Location: [facility] OR;  Service: Orthopedics;  Laterality: Bilateral;   APPLICATION OF WOUND VAC Bilateral 10/24/2022   Procedure: APPLICATION OF WOUND VAC;  Surgeon: Timothy Ford, MD;  Location: [facility] OR;  Service: Orthopedics;  Laterality: Bilateral;   BIOPSY  01/28/2020   Procedure: BIOPSY;  Surgeon: Felecia Hopper, MD;  Location: [facility];  Service: Gastroenterology;;   BOTOX INJECTION  08/26/2021   Procedure: BOTOX INJECTION;  Surgeon: Albertina Hugger, MD;  Location: [facility];  Service: Gastroenterology;;   ESOPHAGOGASTRODUODENOSCOPY N/A 01/28/2020   Procedure: ESOPHAGOGASTRODUODENOSCOPY (EGD);  Surgeon: Felecia Hopper, MD;  Location: [facility];  Service: Gastroenterology;  Laterality: N/A;   ESOPHAGOGASTRODUODENOSCOPY (EGD) WITH PROPOFOL Left 09/08/2015   Procedure: ESOPHAGOGASTRODUODENOSCOPY (EGD) WITH PROPOFOL;  Surgeon: Evangeline Hilts, MD;  Location: [facility];  Service: Endoscopy;  Laterality: Left;   ESOPHAGOGASTRODUODENOSCOPY (EGD) WITH PROPOFOL N/A 08/26/2021   Procedure: ESOPHAGOGASTRODUODENOSCOPY (EGD) WITH PROPOFOL;  Surgeon: Albertina Hugger, MD;  Location: [facility];  Service: Gastroenterology;  Laterality: N/A;   GASTRIC STIMULATOR IMPLANT SURGERY  06/2022   at [facility]   I & D EXTREMITY Bilateral 10/12/2022   Procedure: IRRIGATION AND  DEBRIDEMENT  BILATERAL LEGS;  Surgeon: Hardy Lia, MD;  Location: [facility] OR;  Service: Orthopedics;  Laterality: Bilateral;   I & D EXTREMITY Right 10/13/2022   Procedure:  RIGHT THIGH WOUND VAC EXCHANGE;  Surgeon: Laneta Pintos, MD;  Location: [facility] OR;  Service: Orthopedics;  Laterality: Right;   I & D EXTREMITY Bilateral 10/17/2022   Procedure: BILATERAL THIGH AND LEG DEBRIDEMENT, PARTIAL BILATERAL FOOT AMPUTATION;  Surgeon: Timothy Ford, MD;  Location: [facility] OR;  Service: Orthopedics;  Laterality: Bilateral;   I & D EXTREMITY Bilateral  11/05/2022   Procedure: BILATERAL THIGH DEBRIDEMENT;  Surgeon: Timothy Ford, MD;  Location: [facility] OR;  Service: Orthopedics;  Laterality: Bilateral;   I & D EXTREMITY Bilateral 11/07/2022   Procedure: BILATERAL THIGH AND LEG DEBRIDEMENT;  Surgeon: Timothy Ford, MD;  Location: [facility] OR;  Service: Orthopedics;  Laterality: Bilateral;   I & D EXTREMITY Bilateral 11/12/2022   Procedure: BILATERAL LEG DEBRIDEMENTS;  Surgeon: Timothy Ford, MD;  Location[facility];  Service: Orthopedics;  Laterality: Bilateral;   IRRIGATION AND DEBRIDEMENT FOOT Bilateral 10/05/2022   Procedure: IRRIGATION AND DEBRIDEMENT FOOT;  Surgeon: Dot Gazella, DPM;  Location: [facility];  Service: Orthopedics/Podiatry;  Laterality: Bilateral;   WISDOM TOOTH EXTRACTION     Patient Active Problem List   Diagnosis Date Noted   Influenza A 04/05/2023   Elevated lipase 04/05/2023   Esophagitis 03/24/2023   SIRS (systemic inflammatory response syndrome) (HCC) 03/02/2023   Fever 03/02/2023   Metabolic acidosis 03/02/2023   Hypertensive urgency 02/21/2023   Chronic pain 02/21/2023   Abscess of thigh 11/28/2022   C. 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   Hypokalemia 11/27/2022   Effusion, left knee 11/05/2022   MDD (major depressive disorder), recurrent episode, moderate (HCC) 10/16/2022   Necrotizing fasciitis of pelvic region and thigh (HCC) 10/08/2022   Necrotizing fasciitis of lower leg (HCC) 10/08/2022   Necrotizing fasciitis (HCC) 10/08/2022   Sepsis due to cellulitis (HCC) 10/03/2022   Cellulitis of lower extremity 10/03/2022   Multinodular goiter 06/18/2022   Malnutrition of moderate degree 04/18/2022   Intractable vomiting with nausea 04/17/2022   DKA (diabetic ketoacidosis) (HCC) 03/04/2022   Nausea vomiting and diarrhea 12/25/2021   Diabetic gastroparesis (HCC) 10/09/2021   Drug-seeking behavior 09/21/2021   Essential hypertension 08/25/2021   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) 08/25/2021   GERD without esophagitis 08/25/2021   Hematemesis    Prolonged Q-T interval on ECG    Nausea and vomiting 07/20/2021   Anemia of chronic disease 06/06/2021   Dehydration 06/04/2021   Hypomagnesemia 04/21/2021   Ulcerated, foot, left, with fat layer exposed (HCC) 04/20/2021   Uncontrolled type 1 diabetes mellitus with hyperglycemia, with long-term current use of insulin (HCC) 12/18/2020   DM type 1 (diabetes mellitus, type 1) (HCC) 12/18/2020   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) 11/09/2020   Diabetic retinopathy of both eyes associated with type 1 diabetes mellitus (HCC) 09/24/2020   Diabetic gastroparesis associated with type 1 diabetes mellitus (HCC) 08/12/2020   Acute kidney injury superimposed on chronic kidney disease (HCC) 07/25/2020   Generalized abdominal pain 07/23/2020   GERD (gastroesophageal reflux disease) 07/23/2020   Diabetic nephropathy associated with type 1 diabetes mellitus (HCC) 06/27/2020   Sepsis (HCC) 05/27/2020   HLD (hyperlipidemia) 05/23/2020   Sinus tachycardia 05/09/2020   Anemia 04/20/2020   Depression with anxiety 07/05/2019   Diabetic polyneuropathy associated with type 1 diabetes mellitus (HCC) 09/24/2017   Gastroparesis    Goiter 10/05/2009   DM2 (diabetes mellitus, type 2) (HCC) 10/05/2009   Hypertension associated  with diabetes (HCC) 10/05/2009    ONSET DATE: 04/07/23 referral  REFERRING DIAG: [phone] (ICD-10-CM) - S/P AKA (above knee amputation) bilateral (HCC)   THERAPY DIAG:  Muscle weakness (generalized)  Other symptoms and signs involving the musculoskeletal system  Abnormal posture  Other abnormalities of gait and mobility  Rationale for Evaluation and Treatment: Rehabilitation  SUBJECTIVE:                                                                                                                                                                                              SUBJECTIVE STATEMENT: Patient reports doing well- is "in a better mood." Recounted her fall that was previously reported to another therapist. No additional falls. Has not been standing/walking at home due to living alone. Currently with 4 ply on R LE and 5 play on L LE.   Pt accompanied by: friend  PERTINENT HISTORY: DM, diabetic gastroparesis, GERD, HTN, hyperthyroidism  PAIN:  Are you having pain? No  VITALS: There were no vitals filed for this visit.  PRECAUTIONS: Fall; B diabetic retinopathy  PATIENT GOALS: "to be able to walk again"                                                                                                                              TREATMENT Theract: -ballet bars:   -static standing with emphasis on regaining LOB using hips/head  -progressed to dynamic standing balance touch head, shoulders, hips, nose on command    -increased LOB through dynamic process, but able to regain once landed in position  GAIT:   -total of 10ft with RW + CGA/MinA + wc follow  -required demonstration for how to complete pivot turn   PATIENT EDUCATION: Education details: Continue HEP Person educated: Patient and friend Education method: Medical illustrator Education comprehension: verbalized understanding and needs further education  HOME EXERCISE PROGRAM: -practice donning/doffing prosthetic Access Code: [email or web address] URL: [email or web address] Date: 04/20/2023 Prepared by: Merry Lofty  Exercises - Prone Hip Extension with Residual Limb (BKA)  - 1 x daily - 7 x weekly -  3 sets - 10 reps - Sidelying Hip Abduction (AKA)  - 1 x daily - 7 x weekly - 3 sets - 10 reps - Supine Piriformis Stretch with Residual Limb (AKA)  - 1 x daily - 7 x weekly - 3 sets - 10 reps  Do each exercise 1-2  times per day Do each exercise 10 repetitions Hold each exercise for 2 seconds to feel your location  AT SINK FIND YOUR MIDLINE  POSITION AND PLACE FEET EQUAL DISTANCE FROM THE MIDLINE.  Try to find this position when standing still for activities.   USE TAPE ON FLOOR TO MARK THE MIDLINE POSITION. You also should try to feel with your limb pressure in socket.  You are trying to feel with limb what you used to feel with the bottom of your foot.  Side to Side Shift: Moving your hips only (not shoulders): move weight onto your left leg, HOLD/FEEL.  Move back to equal weight on each leg, HOLD/FEEL. Move weight onto your right leg, HOLD/FEEL. Move back to equal weight on each leg, HOLD/FEEL. Repeat.  Start with both hands on sink, progress to right hand only, then no hands.  Front to Back Shift: Moving your hips only (not shoulders): move your weight forward onto your toes, HOLD/FEEL. Move your weight back to equal Flat Foot on both legs, HOLD/FEEL. Move your weight back onto your heels, HOLD/FEEL. Move your weight back to equal on both legs, HOLD/FEEL. Repeat.   tart with both hands on sink, progress to right hand only, then no hands. Moving Cones / Cups: With equal weight on each leg: Hold on with one hand the first time, then progress to no hand supports. Move cups from one side of sink to the other. Place cups ~2" out of your reach, progress to 10" beyond reach.  Place one hand in middle of sink and reach with other hand. Do both arms.  Then hover one hand and move cups with other hand. -just use a crossbody reach to end of safe weight shift ROM Overhead/Upward Reaching: alternated reaching up to top cabinets or ceiling if no cabinets present. Keep equal weight on each leg. Start with one hand support on counter while other hand reaches and progress to no hand support with reaching.  ace one hand in middle of sink and reach with other hand. Do both arms.  Then hover one hand and move cups with other hand.  5.   Looking Over Shoulders: With equal weight on each leg: alternate turning to look over your shoulders with one hand support on  counter as needed.  Start with head motions only to look in front of shoulder, then even with shoulder and progress to looking behind you. To look to side, move head /eyes, then shoulder on side looking pulls back, shift more weight to side looking and pull hip back. ace one hand in middle of sink and let go with other hand so your shoulder can pull back. Switch hands to look other way.   Then hover one hand and move cups with other hand.  6.  Stepping with leg that is not amputated:  Move items under cabinet out of your way. Shift your hips/pelvis so weight on prosthesis. SLOWLY step other leg so front of foot is in cabinet. Then step back to floor. - just hike the hip and place the foot back in the starting spot  GOALS: Goals reviewed with patient? Yes  SHORT TERM GOALS: Target date: 05/15/23  Pt will be independent with initial HEP for improved gait mechanics and balance  Baseline: to be updated, reports she has not been doing at home doesn't trust CNA Goal status: NOT MET  2.  Patient will ambulate at least 46ft with LRAD and no more than MinA to demonstrate improved mobility  Baseline: 50ft // bars + MinA, 10 feet with 2WW + minA Goal status: NOT met  3.  Patient will complete sit <> stand with LRAD and no more than CGA for improved independence with transfers Baseline: MinA, required minA in today's session  Goal status: NOT met - required minA due to posterior instability  4.  Patient will complete floor transfer with assist as initial step of fall recovery  Baseline: unable to complete on eval; MinA Goal status: MET   LONG TERM GOALS: Target date: 07/10/23  Pt will be independent with final HEP for improved gait mechanics and balance  Baseline: to be updated Goal status: INITIAL  Patient will ambulate at least 41ft with LRAD, ModI to demonstrate improved mobility  Baseline: 58ft // bars + MinA Goal status: INITIAL  Patient will complete sit <> stand with LRAD. SBA for  improved independence with transfers Baseline: MinA Goal status: REVISED  Patient will complete floor transfer without assist as initial step of fall recovery  Baseline: unable to complete on eval Goal status: INITIAL  5. Patient will don/doff prosthetics independently   Baseline: MaxA  Goal status: INITIAL  ASSESSMENT:  CLINICAL IMPRESSION: Patient seen for skilled PT session with progressing base of support balance and gait training with stubbies. Improving ability to regain balance with LOB using hip strategy primarily. She was able to progress distance walked including a soft L turn with demonstration. Is limited in sharp turns due to increased tread on feet of stubbies. Continue POC.   OBJECTIVE IMPAIRMENTS: Abnormal gait, decreased activity tolerance, decreased balance, decreased endurance, decreased knowledge of condition, decreased knowledge of use of DME, decreased strength, impaired vision/preception, and prosthetic dependency .   ACTIVITY LIMITATIONS: carrying, lifting, standing, squatting, transfers, hygiene/grooming, locomotion level, and caring for others  PARTICIPATION LIMITATIONS: meal prep, cleaning, interpersonal relationship, driving, shopping, community activity, and occupation  PERSONAL FACTORS: Age, Behavior pattern, Past/current experiences, Social background, Time since onset of injury/illness/exacerbation, Transportation, and 3+ comorbidities: see above  are also affecting patient's functional outcome.   REHAB POTENTIAL: Good  CLINICAL DECISION MAKING: Stable/uncomplicated  EVALUATION COMPLEXITY: Low  PLAN:  PT FREQUENCY: 2x/week  PT DURATION: 12 weeks  PLANNED INTERVENTIONS: 97164- PT Re-evaluation, 97110-Therapeutic exercises, 97530- Therapeutic activity, 97112- Neuromuscular re-education, 97535- Self Care, 13086- Manual therapy, [phone]- Gait training, [phone]- Prosthetic training, [phone]- Aquatic Therapy, Patient/Family education, Balance training, Stair  training, Scar mobilization, Vestibular training, Visual/preceptual remediation/compensation, DME instructions, and Wheelchair mobility training  PLAN FOR NEXT SESSION: Gait, balance, floor transfers  how is modified sink HEP?, balance recovery in standing/static stability w/ dec UE reliance, walking w/ 2WW (low bars)?   Rebecca Campus, PT Rebecca Campus, PT, DPT, CBIS   06/01/2023, 3:17 PM

## 2023-06-04 ENCOUNTER — Ambulatory Visit: Admitting: Occupational Therapy

## 2023-06-04 ENCOUNTER — Ambulatory Visit: Payer: 59

## 2023-06-04 DIAGNOSIS — R29898 Other symptoms and signs involving the musculoskeletal system: Secondary | ICD-10-CM

## 2023-06-04 DIAGNOSIS — M6281 Muscle weakness (generalized): Secondary | ICD-10-CM | POA: Diagnosis not present

## 2023-06-04 DIAGNOSIS — R293 Abnormal posture: Secondary | ICD-10-CM

## 2023-06-04 DIAGNOSIS — R2689 Other abnormalities of gait and mobility: Secondary | ICD-10-CM

## 2023-06-04 NOTE — Therapy (Signed)
 OUTPATIENT PHYSICAL THERAPY NEURO TREATMENT   Patient Name: Kelli Hudson MRN: [id number] DOB:1990-11-23, 33 y.o., female Today's Date: 06/04/2023   PCP: Vevelyn Gowers, NP REFERRING PROVIDER: Gearldean Keepers, MD  END OF SESSION:  PT End of Session - 06/04/23 1342     Visit Number 14    Number of Visits 25    Date for PT Re-Evaluation 07/10/23    Authorization Type Medicare/Medicaid    Progress Note Due on Visit 20    PT Start Time 1315    PT Stop Time 1355    PT Time Calculation (min) 40 min    Equipment Utilized During Treatment Gait belt    Activity Tolerance Patient tolerated treatment well    Behavior During Therapy WFL for tasks assessed/performed             Past Medical History:  Diagnosis Date   Acute H. pylori gastric ulcer    Coffee ground emesis    Diabetes mellitus (HCC)    Diabetic gastroparesis (HCC)    DKA (diabetic ketoacidosis) (HCC) 02/24/2021   Gastroparesis    GERD (gastroesophageal reflux disease)    Hypertension    Hyperthyroidism    Intractable nausea and vomiting 04/20/2021   Normocytic anemia 04/20/2020   Prolonged Q-T interval on ECG    Past Surgical History:  Procedure Laterality Date   AMPUTATION Bilateral 10/24/2022   Procedure: BILATERAL BELOW KNEE AMPUTATION;  Surgeon: Timothy Ford, MD;  Location: [facility] OR;  Service: Orthopedics;  Laterality: Bilateral;   AMPUTATION Bilateral 10/08/2022   Procedure: BILATERAL LEG DEBRIDEMENT;  Surgeon: Timothy Ford, MD;  Location: [facility] OR;  Service: Orthopedics;  Laterality: Bilateral;   AMPUTATION Bilateral 11/14/2022   Procedure: BILATERAL ABOVE KNEE AMPUTATION;  Surgeon: Timothy Ford, MD;  Location: [facility] OR;  Service: Orthopedics;  Laterality: Bilateral;   AMPUTATION TOE Left 03/10/2018   Procedure: AMPUTATION FIFTH TOE;  Surgeon: Charity Conch, DPM;  Location: [facility];  Service: Podiatry;  Laterality: Left;   APPLICATION OF WOUND VAC Bilateral 10/12/2022    Procedure: APPLICATION OF WOUND VAC BILATERAL LEGS;  Surgeon: Hardy Lia, MD;  Location: [facility] OR;  Service: Orthopedics;  Laterality: Bilateral;   APPLICATION OF WOUND VAC Bilateral 10/24/2022   Procedure: APPLICATION OF WOUND VAC;  Surgeon: Timothy Ford, MD;  Location: [facility] OR;  Service: Orthopedics;  Laterality: Bilateral;   BIOPSY  01/28/2020   Procedure: BIOPSY;  Surgeon: Felecia Hopper, MD;  Location: [facility];  Service: Gastroenterology;;   BOTOX INJECTION  08/26/2021   Procedure: BOTOX INJECTION;  Surgeon: Albertina Hugger, MD;  Location: [facility];  Service: Gastroenterology;;   ESOPHAGOGASTRODUODENOSCOPY N/A 01/28/2020   Procedure: ESOPHAGOGASTRODUODENOSCOPY (EGD);  Surgeon: Felecia Hopper, MD;  Location: [facility];  Service: Gastroenterology;  Laterality: N/A;   ESOPHAGOGASTRODUODENOSCOPY (EGD) WITH PROPOFOL Left 09/08/2015   Procedure: ESOPHAGOGASTRODUODENOSCOPY (EGD) WITH PROPOFOL;  Surgeon: Evangeline Hilts, MD;  Location: [facility] ENDOSCOPY;  Service: Endoscopy;  Laterality: Left;   ESOPHAGOGASTRODUODENOSCOPY (EGD) WITH PROPOFOL N/A 08/26/2021   Procedure: ESOPHAGOGASTRODUODENOSCOPY (EGD) WITH PROPOFOL;  Surgeon: Albertina Hugger, MD;  Location: [facility];  Service: Gastroenterology;  Laterality: N/A;   GASTRIC STIMULATOR IMPLANT SURGERY  06/2022   at [facility]   I & D EXTREMITY Bilateral 10/12/2022   Procedure: IRRIGATION AND  DEBRIDEMENT  BILATERAL LEGS;  Surgeon: Hardy Lia, MD;  Location: [facility] OR;  Service: Orthopedics;  Laterality: Bilateral;   I & D EXTREMITY Right 10/13/2022   Procedure: RIGHT THIGH WOUND  VAC EXCHANGE;  Surgeon: Laneta Pintos, MD;  Location: [facility] OR;  Service: Orthopedics;  Laterality: Right;   I & D EXTREMITY Bilateral 10/17/2022   Procedure: BILATERAL THIGH AND LEG DEBRIDEMENT, PARTIAL BILATERAL FOOT AMPUTATION;  Surgeon: Timothy Ford, MD;  Location: [facility] OR;  Service: Orthopedics;  Laterality: Bilateral;   I & D EXTREMITY Bilateral 11/05/2022    Procedure: BILATERAL THIGH DEBRIDEMENT;  Surgeon: Timothy Ford, MD;  Location: [facility] OR;  Service: Orthopedics;  Laterality: Bilateral;   I & D EXTREMITY Bilateral 11/07/2022   Procedure: BILATERAL THIGH AND LEG DEBRIDEMENT;  Surgeon: Timothy Ford, MD;  Location: [facility] OR;  Service: Orthopedics;  Laterality: Bilateral;   I & D EXTREMITY Bilateral 11/12/2022   Procedure: BILATERAL LEG DEBRIDEMENTS;  Surgeon: Timothy Ford, MD;  Location: [facility] OR;  Service: Orthopedics;  Laterality: Bilateral;   IRRIGATION AND DEBRIDEMENT FOOT Bilateral 10/05/2022   Procedure: IRRIGATION AND DEBRIDEMENT FOOT;  Surgeon: Dot Gazella, DPM;  Location: [facility];  Service: Orthopedics/Podiatry;  Laterality: Bilateral;   WISDOM TOOTH EXTRACTION     Patient Active Problem List   Diagnosis Date Noted   Influenza A 04/05/2023   Elevated lipase 04/05/2023   Esophagitis 03/24/2023   SIRS (systemic inflammatory response syndrome) (HCC) 03/02/2023   Fever 03/02/2023   Metabolic acidosis 03/02/2023   Hypertensive urgency 02/21/2023   Chronic pain 02/21/2023   Abscess of thigh 11/28/2022   C. 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   Hypokalemia 11/27/2022   Effusion, left knee 11/05/2022   MDD (major depressive disorder), recurrent episode, moderate (HCC) 10/16/2022   Necrotizing fasciitis of pelvic region and thigh (HCC) 10/08/2022   Necrotizing fasciitis of lower leg (HCC) 10/08/2022   Necrotizing fasciitis (HCC) 10/08/2022   Sepsis due to cellulitis (HCC) 10/03/2022   Cellulitis of lower extremity 10/03/2022   Multinodular goiter 06/18/2022   Malnutrition of moderate degree 04/18/2022   Intractable vomiting with nausea 04/17/2022   DKA (diabetic ketoacidosis) (HCC) 03/04/2022   Nausea vomiting and diarrhea 12/25/2021   Diabetic gastroparesis (HCC) 10/09/2021   Drug-seeking behavior 09/21/2021   Essential hypertension 08/25/2021   Diabetes mellitus type 1 with  complications (HCC) 08/25/2021   GERD without esophagitis 08/25/2021   Hematemesis    Prolonged Q-T interval on ECG    Nausea and vomiting 07/20/2021   Anemia of chronic disease 06/06/2021   Dehydration 06/04/2021   Hypomagnesemia 04/21/2021   Ulcerated, foot, left, with fat layer exposed (HCC) 04/20/2021   Uncontrolled type 1 diabetes mellitus with hyperglycemia, with long-term current use of insulin (HCC) 12/18/2020   DM type 1 (diabetes mellitus, type 1) (HCC) 12/18/2020   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) 11/09/2020   Diabetic retinopathy of both eyes associated with type 1 diabetes mellitus (HCC) 09/24/2020   Diabetic gastroparesis associated with type 1 diabetes mellitus (HCC) 08/12/2020   Acute kidney injury superimposed on chronic kidney disease (HCC) 07/25/2020   Generalized abdominal pain 07/23/2020   GERD (gastroesophageal reflux disease) 07/23/2020   Diabetic nephropathy associated with type 1 diabetes mellitus (HCC) 06/27/2020   Sepsis (HCC) 05/27/2020   HLD (hyperlipidemia) 05/23/2020   Sinus tachycardia 05/09/2020   Anemia 04/20/2020   Depression with anxiety 07/05/2019   Diabetic polyneuropathy associated with type 1 diabetes mellitus (HCC) 09/24/2017   Gastroparesis    Goiter 10/05/2009   DM2 (diabetes mellitus, type 2) (HCC) 10/05/2009   Hypertension associated with diabetes (HCC)  10/05/2009    ONSET DATE: 04/07/23 referral  REFERRING DIAG: [phone] (ICD-10-CM) - S/P AKA (above knee amputation) bilateral (HCC)   THERAPY DIAG:  Muscle weakness (generalized)  Other symptoms and signs involving the musculoskeletal system  Abnormal posture  Other abnormalities of gait and mobility  Rationale for Evaluation and Treatment: Rehabilitation  SUBJECTIVE:                                                                                                                                                                                              SUBJECTIVE STATEMENT: Patient reports doing well- is "in a better mood." Recounted her fall that was previously reported to another therapist. No additional falls. Has not been standing/walking at home due to living alone. Currently with 4 ply on R LE and 5 play on L LE.   Pt accompanied by: friend  PERTINENT HISTORY: DM, diabetic gastroparesis, GERD, HTN, hyperthyroidism  PAIN:  Are you having pain? No  VITALS: There were no vitals filed for this visit.  PRECAUTIONS: Fall; B diabetic retinopathy  PATIENT GOALS: "to be able to walk again"                                                                                                                              TREATMENT Theract: -don prosthetics with MinA -standing from therapy mat to RW with grossly MinA/CGA  -CGA/supervision once established BOS -retrieving squigz from mirror to challenge dynamic standing abalnce with U UE support  Gait: -x76ft with 1 90* turn + CGA/MinA    PATIENT EDUCATION: Education details: Continue HEP Person educated: Patient and friend Education method: Medical illustrator Education comprehension: verbalized understanding and needs further education  HOME EXERCISE PROGRAM: -practice donning/doffing prosthetic Access Code: [email or web address] URL: [email or web address] Date: 04/20/2023 Prepared by: Merry Lofty  Exercises - Prone Hip Extension with Residual Limb (BKA)  - 1 x daily - 7 x weekly - 3 sets - 10 reps - Sidelying Hip Abduction (AKA)  - 1 x daily - 7 x weekly - 3 sets - 10 reps - Supine  Piriformis Stretch with Residual Limb (AKA)  - 1 x daily - 7 x weekly - 3 sets - 10 reps  Do each exercise 1-2  times per day Do each exercise 10 repetitions Hold each exercise for 2 seconds to feel your location  AT SINK FIND YOUR MIDLINE POSITION AND PLACE FEET EQUAL DISTANCE FROM THE MIDLINE.  Try to find this position when standing still for activities.   USE  TAPE ON FLOOR TO MARK THE MIDLINE POSITION. You also should try to feel with your limb pressure in socket.  You are trying to feel with limb what you used to feel with the bottom of your foot.  Side to Side Shift: Moving your hips only (not shoulders): move weight onto your left leg, HOLD/FEEL.  Move back to equal weight on each leg, HOLD/FEEL. Move weight onto your right leg, HOLD/FEEL. Move back to equal weight on each leg, HOLD/FEEL. Repeat.  Start with both hands on sink, progress to right hand only, then no hands.  Front to Back Shift: Moving your hips only (not shoulders): move your weight forward onto your toes, HOLD/FEEL. Move your weight back to equal Flat Foot on both legs, HOLD/FEEL. Move your weight back onto your heels, HOLD/FEEL. Move your weight back to equal on both legs, HOLD/FEEL. Repeat.   tart with both hands on sink, progress to right hand only, then no hands. Moving Cones / Cups: With equal weight on each leg: Hold on with one hand the first time, then progress to no hand supports. Move cups from one side of sink to the other. Place cups ~2" out of your reach, progress to 10" beyond reach.  Place one hand in middle of sink and reach with other hand. Do both arms.  Then hover one hand and move cups with other hand. -just use a crossbody reach to end of safe weight shift ROM Overhead/Upward Reaching: alternated reaching up to top cabinets or ceiling if no cabinets present. Keep equal weight on each leg. Start with one hand support on counter while other hand reaches and progress to no hand support with reaching.  ace one hand in middle of sink and reach with other hand. Do both arms.  Then hover one hand and move cups with other hand.  5.   Looking Over Shoulders: With equal weight on each leg: alternate turning to look over your shoulders with one hand support on counter as needed.  Start with head motions only to look in front of shoulder, then even with shoulder and progress to looking  behind you. To look to side, move head /eyes, then shoulder on side looking pulls back, shift more weight to side looking and pull hip back. ace one hand in middle of sink and let go with other hand so your shoulder can pull back. Switch hands to look other way.   Then hover one hand and move cups with other hand.  6.  Stepping with leg that is not amputated:  Move items under cabinet out of your way. Shift your hips/pelvis so weight on prosthesis. SLOWLY step other leg so front of foot is in cabinet. Then step back to floor. - just hike the hip and place the foot back in the starting spot  GOALS: Goals reviewed with patient? Yes  SHORT TERM GOALS: Target date: 05/15/23  Pt will be independent with initial HEP for improved gait mechanics and balance  Baseline: to be updated, reports she has not been doing at home  doesn't trust CNA Goal status: NOT MET  2.  Patient will ambulate at least 76ft with LRAD and no more than MinA to demonstrate improved mobility  Baseline: 71ft // bars + MinA, 10 feet with 2WW + minA Goal status: NOT met  3.  Patient will complete sit <> stand with LRAD and no more than CGA for improved independence with transfers Baseline: MinA, required minA in today's session  Goal status: NOT met - required minA due to posterior instability  4.  Patient will complete floor transfer with assist as initial step of fall recovery  Baseline: unable to complete on eval; MinA Goal status: MET   LONG TERM GOALS: Target date: 07/10/23  Pt will be independent with final HEP for improved gait mechanics and balance  Baseline: to be updated Goal status: INITIAL  Patient will ambulate at least 47ft with LRAD, ModI to demonstrate improved mobility  Baseline: 27ft // bars + MinA Goal status: INITIAL  Patient will complete sit <> stand with LRAD. SBA for improved independence with transfers Baseline: MinA Goal status: REVISED  Patient will complete floor transfer without assist as  initial step of fall recovery  Baseline: unable to complete on eval Goal status: INITIAL  5. Patient will don/doff prosthetics independently   Baseline: MaxA  Goal status: INITIAL  ASSESSMENT:  CLINICAL IMPRESSION: Patient seen for skilled PT session with emphasis on progressing dynamic standing balance in prosthetics. Improving her ability to establish BOS without assist and demonstrating increased comfort with removing single UE to complete dynamic task. Limited endurance remained to ambulate in prosthetics, but was able to complete 90* turn with MinA and 1x verbal cue to widen L step width for increased stability. Continue POC.   OBJECTIVE IMPAIRMENTS: Abnormal gait, decreased activity tolerance, decreased balance, decreased endurance, decreased knowledge of condition, decreased knowledge of use of DME, decreased strength, impaired vision/preception, and prosthetic dependency .   ACTIVITY LIMITATIONS: carrying, lifting, standing, squatting, transfers, hygiene/grooming, locomotion level, and caring for others  PARTICIPATION LIMITATIONS: meal prep, cleaning, interpersonal relationship, driving, shopping, community activity, and occupation  PERSONAL FACTORS: Age, Behavior pattern, Past/current experiences, Social background, Time since onset of injury/illness/exacerbation, Transportation, and 3+ comorbidities: see above  are also affecting patient's functional outcome.   REHAB POTENTIAL: Good  CLINICAL DECISION MAKING: Stable/uncomplicated  EVALUATION COMPLEXITY: Low  PLAN:  PT FREQUENCY: 2x/week  PT DURATION: 12 weeks  PLANNED INTERVENTIONS: 97164- PT Re-evaluation, 97110-Therapeutic exercises, 97530- Therapeutic activity, 97112- Neuromuscular re-education, 97535- Self Care, 54098- Manual therapy, [phone]- Gait training, [phone]- Prosthetic training, [phone]- Aquatic Therapy, Patient/Family education, Balance training, Stair training, Scar mobilization, Vestibular training,  Visual/preceptual remediation/compensation, DME instructions, and Wheelchair mobility training  PLAN FOR NEXT SESSION: Gait, balance, floor transfers  how is modified sink HEP?, balance recovery in standing/static stability w/ dec UE reliance, walking w/ 2WW (low bars)?   Rebecca Campus, PT Rebecca Campus, PT, DPT, CBIS   06/04/2023, 2:22 PM

## 2023-06-04 NOTE — Therapy (Signed)
 OUTPATIENT OCCUPATIONAL THERAPY NEURO TREATMENT  Patient Name: Kelli Hudson MRN: [id number] DOB:05-21-1990, 33 y.o., female Today's Date: 06/04/2023  PCP: Vevelyn Gowers, NP  REFERRING PROVIDER: Timothy Ford, MD  END OF SESSION:  OT End of Session - 06/04/23 1646     Visit Number 7    Number of Visits 17   including eval   Date for OT Re-Evaluation 06/26/23    Authorization Type [employer] Medicare Dual/Medicaid, covered 100%    Progress Note Due on Visit 10    OT Start Time 1403    OT Stop Time 1447    OT Time Calculation (min) 44 min    Activity Tolerance Patient tolerated treatment well    Behavior During Therapy WFL for tasks assessed/performed               Past Medical History:  Diagnosis Date   Acute H. pylori gastric ulcer    Coffee ground emesis    Diabetes mellitus (HCC)    Diabetic gastroparesis (HCC)    DKA (diabetic ketoacidosis) (HCC) 02/24/2021   Gastroparesis    GERD (gastroesophageal reflux disease)    Hypertension    Hyperthyroidism    Intractable nausea and vomiting 04/20/2021   Normocytic anemia 04/20/2020   Prolonged Q-T interval on ECG    Past Surgical History:  Procedure Laterality Date   AMPUTATION Bilateral 10/24/2022   Procedure: BILATERAL BELOW KNEE AMPUTATION;  Surgeon: Timothy Ford, MD;  Location: [facility] OR;  Service: Orthopedics;  Laterality: Bilateral;   AMPUTATION Bilateral 10/08/2022   Procedure: BILATERAL LEG DEBRIDEMENT;  Surgeon: Timothy Ford, MD;  Location: [facility] OR;  Service: Orthopedics;  Laterality: Bilateral;   AMPUTATION Bilateral 11/14/2022   Procedure: BILATERAL ABOVE KNEE AMPUTATION;  Surgeon: Timothy Ford, MD;  Location: [facility] OR;  Service: Orthopedics;  Laterality: Bilateral;   AMPUTATION TOE Left 03/10/2018   Procedure: AMPUTATION FIFTH TOE;  Surgeon: Charity Conch, DPM;  Location: [facility];  Service: Podiatry;  Laterality: Left;   APPLICATION OF WOUND VAC Bilateral 10/12/2022    Procedure: APPLICATION OF WOUND VAC BILATERAL LEGS;  Surgeon: Hardy Lia, MD;  Location: [facility] OR;  Service: Orthopedics;  Laterality: Bilateral;   APPLICATION OF WOUND VAC Bilateral 10/24/2022   Procedure: APPLICATION OF WOUND VAC;  Surgeon: Timothy Ford, MD;  Location: [facility] OR;  Service: Orthopedics;  Laterality: Bilateral;   BIOPSY  01/28/2020   Procedure: BIOPSY;  Surgeon: Felecia Hopper, MD;  Location: [facility];  Service: Gastroenterology;;   BOTOX INJECTION  08/26/2021   Procedure: BOTOX INJECTION;  Surgeon: Albertina Hugger, MD;  Location: [facility];  Service: Gastroenterology;;   ESOPHAGOGASTRODUODENOSCOPY N/A 01/28/2020   Procedure: ESOPHAGOGASTRODUODENOSCOPY (EGD);  Surgeon: Felecia Hopper, MD;  Location: [facility];  Service: Gastroenterology;  Laterality: N/A;   ESOPHAGOGASTRODUODENOSCOPY (EGD) WITH PROPOFOL Left 09/08/2015   Procedure: ESOPHAGOGASTRODUODENOSCOPY (EGD) WITH PROPOFOL;  Surgeon: Evangeline Hilts, MD;  Location: [facility];  Service: Endoscopy;  Laterality: Left;   ESOPHAGOGASTRODUODENOSCOPY (EGD) WITH PROPOFOL N/A 08/26/2021   Procedure: ESOPHAGOGASTRODUODENOSCOPY (EGD) WITH PROPOFOL;  Surgeon: Albertina Hugger, MD;  Location: [facility];  Service: Gastroenterology;  Laterality: N/A;   GASTRIC STIMULATOR IMPLANT SURGERY  06/2022   at [facility]   I & D EXTREMITY Bilateral 10/12/2022   Procedure: IRRIGATION AND  DEBRIDEMENT  BILATERAL LEGS;  Surgeon: Hardy Lia, MD;  Location: [facility] OR;  Service: Orthopedics;  Laterality: Bilateral;   I & D EXTREMITY Right 10/13/2022   Procedure: RIGHT THIGH WOUND  VAC EXCHANGE;  Surgeon: Laneta Pintos, MD;  Location: [facility] OR;  Service: Orthopedics;  Laterality: Right;   I & D EXTREMITY Bilateral 10/17/2022   Procedure: BILATERAL THIGH AND LEG DEBRIDEMENT, PARTIAL BILATERAL FOOT AMPUTATION;  Surgeon: Timothy Ford, MD;  Location: [facility] OR;  Service: Orthopedics;  Laterality: Bilateral;   I & D EXTREMITY Bilateral 11/05/2022    Procedure: BILATERAL THIGH DEBRIDEMENT;  Surgeon: Timothy Ford, MD;  Location: [facility] OR;  Service: Orthopedics;  Laterality: Bilateral;   I & D EXTREMITY Bilateral 11/07/2022   Procedure: BILATERAL THIGH AND LEG DEBRIDEMENT;  Surgeon: Timothy Ford, MD;  Location: [facility] OR;  Service: Orthopedics;  Laterality: Bilateral;   I & D EXTREMITY Bilateral 11/12/2022   Procedure: BILATERAL LEG DEBRIDEMENTS;  Surgeon: Timothy Ford, MD;  Location: [facility] OR;  Service: Orthopedics;  Laterality: Bilateral;   IRRIGATION AND DEBRIDEMENT FOOT Bilateral 10/05/2022   Procedure: IRRIGATION AND DEBRIDEMENT FOOT;  Surgeon: Dot Gazella, DPM;  Location: [facility];  Service: Orthopedics/Podiatry;  Laterality: Bilateral;   WISDOM TOOTH EXTRACTION     Patient Active Problem List   Diagnosis Date Noted   Influenza A 04/05/2023   Elevated lipase 04/05/2023   Esophagitis 03/24/2023   SIRS (systemic inflammatory response syndrome) (HCC) 03/02/2023   Fever 03/02/2023   Metabolic acidosis 03/02/2023   Hypertensive urgency 02/21/2023   Chronic pain 02/21/2023   Abscess of thigh 11/28/2022   C. 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   Hypokalemia 11/27/2022   Effusion, left knee 11/05/2022   MDD (major depressive disorder), recurrent episode, moderate (HCC) 10/16/2022   Necrotizing fasciitis of pelvic region and thigh (HCC) 10/08/2022   Necrotizing fasciitis of lower leg (HCC) 10/08/2022   Necrotizing fasciitis (HCC) 10/08/2022   Sepsis due to cellulitis (HCC) 10/03/2022   Cellulitis of lower extremity 10/03/2022   Multinodular goiter 06/18/2022   Malnutrition of moderate degree 04/18/2022   Intractable vomiting with nausea 04/17/2022   DKA (diabetic ketoacidosis) (HCC) 03/04/2022   Nausea vomiting and diarrhea 12/25/2021   Diabetic gastroparesis (HCC) 10/09/2021   Drug-seeking behavior 09/21/2021   Essential hypertension 08/25/2021   Diabetes mellitus type 1 with  complications (HCC) 08/25/2021   GERD without esophagitis 08/25/2021   Hematemesis    Prolonged Q-T interval on ECG    Nausea and vomiting 07/20/2021   Anemia of chronic disease 06/06/2021   Dehydration 06/04/2021   Hypomagnesemia 04/21/2021   Ulcerated, foot, left, with fat layer exposed (HCC) 04/20/2021   Uncontrolled type 1 diabetes mellitus with hyperglycemia, with long-term current use of insulin (HCC) 12/18/2020   DM type 1 (diabetes mellitus, type 1) (HCC) 12/18/2020   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) 11/09/2020   Diabetic retinopathy of both eyes associated with type 1 diabetes mellitus (HCC) 09/24/2020   Diabetic gastroparesis associated with type 1 diabetes mellitus (HCC) 08/12/2020   Acute kidney injury superimposed on chronic kidney disease (HCC) 07/25/2020   Generalized abdominal pain 07/23/2020   GERD (gastroesophageal reflux disease) 07/23/2020   Diabetic nephropathy associated with type 1 diabetes mellitus (HCC) 06/27/2020   Sepsis (HCC) 05/27/2020   HLD (hyperlipidemia) 05/23/2020   Sinus tachycardia 05/09/2020   Anemia 04/20/2020   Depression with anxiety 07/05/2019   Diabetic polyneuropathy associated with type 1 diabetes mellitus (HCC) 09/24/2017   Gastroparesis    Goiter 10/05/2009   DM2 (diabetes mellitus, type 2) (HCC) 10/05/2009   Hypertension associated with diabetes (HCC)  10/05/2009   ONSET DATE: 04/16/23 (referral date)  REFERRING DIAG: [phone] (ICD-10-CM) - S/P AKA (above knee amputation) bilateral   THERAPY DIAG:  Muscle weakness (generalized)  Other symptoms and signs involving the musculoskeletal system  Rationale for Evaluation and Treatment: Rehabilitation  SUBJECTIVE:   SUBJECTIVE STATEMENT: Pt reported increased pain of hip today. Pt reported no other updates/concerns. Pt's friend reported pt's new w/c likely arriving in June 2025.   Pt accompanied by: self,  friend Kenard Gower)  PERTINENT HISTORY:  DM, diabetic gastroparesis, GERD, HTN, hyperthyroidism   PRECAUTIONS: Fall; B diabetic retinopathy   WEIGHT BEARING RESTRICTIONS: No  PAIN:  Are you having pain? R hip/groin: 6/10 pain level  FALLS: Has patient fallen in last 6 months? Yes. Number of falls 1 - around Thanksgiving when attempting to transfer to a second w/c  LIVING ENVIRONMENT:  Lives with: lives alone Lives in: House/apartment Stairs: No Has following equipment at home: Wheelchair (manual) and shower chair; aide 7 days/week for 4 hours  PLOF: Independent with basic ADLs, community mobility: assistance from friends  PATIENT GOALS: "I want to work on standing and balance"  OBJECTIVE:  Note: Objective measures were completed at Evaluation unless otherwise noted.  There were no vitals filed for this visit.     HAND DOMINANCE: Right  ADLs: Overall ADLs: ind, cautious Eating: ind Grooming: ind UB Dressing: ind, some difficulty with donning standard bras though no difficulty with donning sports bras LB Dressing: ind Toileting: ind Bathing: prefers at least direct or distant supervision though has completed ind - "I'm scared because shower chair moves a little bit because it's a little bigger than the tub so I feel better when someone is there." Tub Shower transfers: tub transfer bench Equipment: tub transfer bench, tub/shower  IADLs: Meal Prep: difficulty cutting food for meal prep/cooking when attempting to stand. Pt reported typically using microwave meals and sometimes using stove/oven. Pt reported trying to not be afraid of doing "normal stuff" again.  Handwriting:  Pt reported no need for handwriting, decreased vision.  Pt reported using voice text to communicate using phone.  MOBILITY STATUS:  w/c, reported standing in prosthetics only when coming to therapy though will attempt to stand at home with another person to supervise  soon  POSTURE COMMENTS:  rounded shoulders and forward head  ACTIVITY  TOLERANCE: Activity tolerance: about the same as PLOF, more tired after standing tasks though only for "just 10 minutes"  FUNCTIONAL OUTCOME MEASURES: PSFS: 6.7   Total score = sum of the activity scores/number of activities Minimum detectable change (90%CI) for average score = 2 points Minimum detectable change (90%CI) for single activity score = 3 points  06/04/23 - 8.7   UPPER EXTREMITY ROM:    Active ROM Right eval Left eval  Shoulder flexion [facility]  Shoulder abduction    Shoulder adduction    Shoulder extension    Shoulder internal rotation    Shoulder external rotation    Elbow flexion    Elbow extension    Wrist flexion    Wrist extension    Wrist ulnar deviation    Wrist radial deviation    Wrist pronation    Wrist supination    (Blank rows = not tested)  UPPER EXTREMITY MMT:     MMT Right eval Left eval  Shoulder flexion    Shoulder abduction    Shoulder adduction    Shoulder extension    Shoulder internal rotation    Shoulder external rotation  Middle trapezius    Lower trapezius    Elbow flexion    Elbow extension    Wrist flexion    Wrist extension    Wrist ulnar deviation    Wrist radial deviation    Wrist pronation    Wrist supination    (Blank rows = not tested)  HAND FUNCTION: Grip strength: Right: 57 lbs; Left: 39 lbs  Pt reported some difficulty with tasks requiring strength of L side. Pt reported noticing weakness of L side compared to R side.  COORDINATION: 9 Hole Peg test: Right: 37 sec; Left: 33 sec, decreased speed secondary to visual deficits  SENSATION: Pt denied numbness/tingling/discomfort of BLE and BUE.  EDEMA: R eyelid swollen.  MUSCLE TONE: RUE: Within functional limits and LUE: Within functional limits  COGNITION: Overall cognitive status: Within functional limits for tasks assessed  VISION: Subjective report: Corrective lens no longer help, low vision per pt, better seeing close up. Pt reported some  peripheral vision L side, less peripheral vision R side. Baseline vision: B diabetic retinopathy: "it's low."  Visual history:  B diabetic retinopathy  VISION ASSESSMENT: Visual tracking: Difficulty tracking black pen. Improved ability to track brightly colored marker to all 4 quadrants.  PERCEPTION: Not tested  PRAXIS: Not tested  OBSERVATIONS: Pt in w/c without prosthetics though with BLE prosthetics available nearby. Pt pleasant with decreased vision secondary to B retinopathy. Pt's friend assisted pt with w/c mobility today.                                                                                                                       TREATMENT:    Self-Care OT reviewed meal prep A/E and adaptive strategies. - to increase ind, efficiency, and safety for meal prep tasks. Pt reported receiving vegetable chopper and looking into ordering adaptive cutting board and rocker knife. Pt reported using TV tray as food prep surface of lower height than countertop. OT educated pt on stabilizing TV tray against wall or counter to improve safety when cutting food items. Pt verbalized understanding. OT reiterated option of cut-resistant gloves to improve safety. Pt verbalized understanding.  Meal prep simulated practice, seated in w/c at table - cutting theraputty with rocker knife and adaptive cutting board modI, then scooping and pouring large pegs from one bowl to another modI.   TherAct OT assessed pt's progress towards goals, see below for updates.   Functional standing with BLE prosthetics, CGA with gait belt. Pt used at least one UE for support at any given time during task - placing and removing large pegs with BUE - to improve functional standing balance for funtional tasks, for BUE functional reach and strengthening, for BUE FM coordination. Pt ind donned/doffed prosthetics.  Review of HEP - to improve carryover, to improve LUE strengthening. Pt returned demo of theraband exercises  (listed below) and demo'd good understanding of theraputty HEP (per pt instructions on 05/18/23). Access Code: [email or web address] URL: [email or web address] Date: 06/04/2023 Prepared by: Carilyn Goodpasture  Exercises -  Seated Scapular Retraction  - 2 x daily - 1 sets - 10 reps - Seated Elbow Flexion with Self-Anchored Resistance  - 2 x daily - 1 sets - 10 reps - Seated Elbow Extension with Self-Anchored Resistance  - 2 x daily - 1 sets - 10 reps - Seated Bilateral Shoulder External Rotation with Resistance  - 2 x daily - 1 sets - 10 reps - Seated Shoulder Flexion with Self-Anchored Resistance  - 2 x daily - 1 sets - 10 reps   PATIENT EDUCATION: Education details: see today's tx above Person educated: Patient and friend Education method: Explanation, Demonstration, Verbal cues, and Handouts Education comprehension: verbalized understanding, returned demonstration, verbal cues required, and needs further education  HOME EXERCISE PROGRAM: 05/18/2023: putty and vision strategies 05/25/2023: O&M training 05/28/23 - red theraband LUE - Access Code: [email or web address]  GOALS: Goals reviewed with patient? Yes  SHORT TERM GOALS: Target date: 05/29/23  Pt will recall or demo at least 2 visual compensation strategies Baseline: impaired vision secondary to diabetic retinopathy 05/21/23 - Pt ind recalled using line guide, turning head to look to R, using finger to visually track information.  Goal status: MET  2.  Pt will verbalize or demo understanding of safe simulated shower transfers modI with A/E and adaptive strategies PRN. Baseline: Pt ind though fearful and prefers supervision during showers and shower transfers d/t unstable shower chair/bench. Pt has tub transfer bench and transfer board available.  05/28/23 - Pt reported shower transfers are "easy." Pt reported going slow and feeling confident with transfers. Goal status: MET  3.  Pt will ind demo understanding of LUE HEP. Baseline: New to outpt  OT 05/21/23 - Pt returned demo of theraputty.  Goal status: MET  4. Pt will verbalize understanding of community resources for individuals with amputations.  Baseline: new to outpt OT  05/21/23 - Pt verbalized understanding of resources.  Goal status: MET  LONG TERM GOALS: Target date: 06/26/23  Pt will ind demo understanding of updated HEP. Baseline: new to outpt OT 06/04/23 - Pt reported completing exercises. Pt returned demo of theraputty and red theraband exercises.  Goal status: MET  2.  Patient will report at least two-point increase in average PSFS score or at least three-point increase in a single activity score indicating functionally significant improvement given minimum detectable change using BLE prosthetics PRN. Baseline: 6.7 total score (See above for individual activity scores) without use of BLE prosthetics. 06/04/23 - 8.7 Goal status: MET using adaptive strategies and A/E  3.  Pt will demo at least 45 lbs LUE grip strength as needed to open jars/containers. Baseline: Grip strength: Right: 57 lbs; Left: 39 lbs 05/25/2023: LUE: 38 lbs 06/04/23 - LUE: 42.5. Pt reported feeling like LUE grip strength is weaker than preferred.  Goal status: in progress  4.  Pt will complete simple meal prep tasks modI using BLE prosthetics as able. Baseline: pt beginning to trial use of BLE prosthetics. Pt reported difficulty with meal prep tasks currently. Goal status: MET  ASSESSMENT:  CLINICAL IMPRESSION: Pt tolerated tasks well and met 3 out of 4 LTG. Pt making good progress towards goals. Pt demo'd improved LUE grip strength. OT educated pt on progress towards goals and option to potentially d/c in next few sessions d/t good progress towards goals. Pt agreeable and requested at least 1 more OT visit. Pt would benefit from at least 1 more OT visit to target LUE grip strength, to target standing tolerance using BLE prosthetics during functional  tasks, and to answer any remaining pt  questions/concerns.   PERFORMANCE DEFICITS: in functional skills including ADLs, IADLs, coordination, dexterity, proprioception, edema, strength, flexibility, Fine motor control, Gross motor control, mobility, balance, body mechanics, endurance, decreased knowledge of use of DME, vision, and UE functional use, cognitive skills including energy/drive, and psychosocial skills including environmental adaptation.   IMPAIRMENTS: are limiting patient from ADLs, IADLs, rest and sleep, work, leisure, and social participation.   CO-MORBIDITIES: may have co-morbidities  that affects occupational performance. Patient will benefit from skilled OT to address above impairments and improve overall function.  REHAB POTENTIAL: Good  PLAN:  OT FREQUENCY: 2x/week  OT DURATION: 8 weeks  PLANNED INTERVENTIONS: 97168 OT Re-evaluation, 97535 self care/ADL training, 04540 therapeutic exercise, 97530 therapeutic activity, 97112 neuromuscular re-education, 97140 manual therapy, 97035 ultrasound, 97018 paraffin, 98119 fluidotherapy, 97010 moist heat, 97010 cryotherapy, 97034 contrast bath, 97760 Orthotics management and training, 14782 Splinting (initial encounter), [id number] Subsequent splinting/medication, passive range of motion, functional mobility training, visual/perceptual remediation/compensation, energy conservation, patient/family education, and DME and/or AE instructions  RECOMMENDED OTHER SERVICES: PT eval already completed  CONSULTED AND AGREED WITH PLAN OF CARE: Patient  PLAN FOR NEXT SESSION:  Monitor per medical event on 05/14/23  Likely d/c next visit - further discuss with pt LUE grip strengthening activities Practice standing tolerance during functional tasks using BLE prosthetics Answer any remaining questions/concerns   Oakley Bellman, OT 06/04/2023, 4:55 PM

## 2023-06-08 ENCOUNTER — Ambulatory Visit: Payer: 59

## 2023-06-08 ENCOUNTER — Ambulatory Visit: Admitting: Occupational Therapy

## 2023-06-08 VITALS — BP 155/94 | HR 100

## 2023-06-08 DIAGNOSIS — R29898 Other symptoms and signs involving the musculoskeletal system: Secondary | ICD-10-CM

## 2023-06-08 DIAGNOSIS — M6281 Muscle weakness (generalized): Secondary | ICD-10-CM

## 2023-06-08 DIAGNOSIS — R293 Abnormal posture: Secondary | ICD-10-CM

## 2023-06-08 DIAGNOSIS — R2689 Other abnormalities of gait and mobility: Secondary | ICD-10-CM

## 2023-06-08 DIAGNOSIS — R41842 Visuospatial deficit: Secondary | ICD-10-CM

## 2023-06-08 NOTE — Therapy (Signed)
 OUTPATIENT OCCUPATIONAL THERAPY NEURO TREATMENT  Patient Name: Kelli Hudson MRN: [id number] DOB:Jan 10, 1991, 33 y.o., female Today's Date: 06/08/2023  PCP: Vevelyn Gowers, NP  REFERRING PROVIDER: Timothy Ford, MD  OCCUPATIONAL THERAPY DISCHARGE SUMMARY  Visits from Start of Care: 8  Current functional level related to goals / functional outcomes: Pt has met 100% of STG and 3 out of 4 LTG to satisfactory levels.  Remaining deficits: Pt is continuing to address tolerance for standing and walking using BLE prosthetics in PT sessions. See PT notes for additional details.  Education / Equipment: Pt has all needed materials and education. Pt understands how to continue on with self-management. See tx notes for more details.   Pt agreeable to D/C.   Patient met majority of rehab goals. Patient is being discharged due to meeting the majority of stated rehab goals and pt is pleased with current functional level as pertaining to OT. OT to hold therapy for now to allow time to build standing tolerance using BLE and allow time for new w/c to arrive. Per pt, new w/c to be "hopefully" delivered in June 2025. A new referral will be required to resume OT services if pt wishes to return to OT to address additional functional tasks when using BLE prosthetics or when using new w/c.      END OF SESSION:  OT End of Session - 06/08/23 1433     Visit Number 8    Number of Visits 17   including eval   Date for OT Re-Evaluation 06/26/23    Authorization Type [employer] Medicare Dual/Medicaid, covered 100%    Progress Note Due on Visit 10    OT Start Time 1402    OT Stop Time 1430    OT Time Calculation (min) 28 min    Activity Tolerance Patient tolerated treatment well    Behavior During Therapy WFL for tasks assessed/performed                Past Medical History:  Diagnosis Date   Acute H. pylori gastric ulcer    Coffee ground emesis    Diabetes mellitus (HCC)    Diabetic  gastroparesis (HCC)    DKA (diabetic ketoacidosis) (HCC) 02/24/2021   Gastroparesis    GERD (gastroesophageal reflux disease)    Hypertension    Hyperthyroidism    Intractable nausea and vomiting 04/20/2021   Normocytic anemia 04/20/2020   Prolonged Q-T interval on ECG    Past Surgical History:  Procedure Laterality Date   AMPUTATION Bilateral 10/24/2022   Procedure: BILATERAL BELOW KNEE AMPUTATION;  Surgeon: Timothy Ford, MD;  Location: [facility] OR;  Service: Orthopedics;  Laterality: Bilateral;   AMPUTATION Bilateral 10/08/2022   Procedure: BILATERAL LEG DEBRIDEMENT;  Surgeon: Timothy Ford, MD;  Location: [facility] OR;  Service: Orthopedics;  Laterality: Bilateral;   AMPUTATION Bilateral 11/14/2022   Procedure: BILATERAL ABOVE KNEE AMPUTATION;  Surgeon: Timothy Ford, MD;  Location: [facility] OR;  Service: Orthopedics;  Laterality: Bilateral;   AMPUTATION TOE Left 03/10/2018   Procedure: AMPUTATION FIFTH TOE;  Surgeon: Charity Conch, DPM;  Location: [facility];  Service: Podiatry;  Laterality: Left;   APPLICATION OF WOUND VAC Bilateral 10/12/2022   Procedure: APPLICATION OF WOUND VAC BILATERAL LEGS;  Surgeon: Hardy Lia, MD;  Location: [facility] OR;  Service: Orthopedics;  Laterality: Bilateral;   APPLICATION OF WOUND VAC Bilateral 10/24/2022   Procedure: APPLICATION OF WOUND VAC;  Surgeon: Timothy Ford, MD;  Location: [facility] OR;  Service:  Orthopedics;  Laterality: Bilateral;   BIOPSY  01/28/2020   Procedure: BIOPSY;  Surgeon: Felecia Hopper, MD;  Location: [facility];  Service: Gastroenterology;;   BOTOX  INJECTION  08/26/2021   Procedure: BOTOX  INJECTION;  Surgeon: Albertina Hugger, MD;  Location: [facility];  Service: Gastroenterology;;   ESOPHAGOGASTRODUODENOSCOPY N/A 01/28/2020   Procedure: ESOPHAGOGASTRODUODENOSCOPY (EGD);  Surgeon: Felecia Hopper, MD;  Location: [facility];  Service: Gastroenterology;  Laterality: N/A;   ESOPHAGOGASTRODUODENOSCOPY (EGD) WITH PROPOFOL  Left  09/08/2015   Procedure: ESOPHAGOGASTRODUODENOSCOPY (EGD) WITH PROPOFOL ;  Surgeon: Evangeline Hilts, MD;  Location: [facility];  Service: Endoscopy;  Laterality: Left;   ESOPHAGOGASTRODUODENOSCOPY (EGD) WITH PROPOFOL  N/A 08/26/2021   Procedure: ESOPHAGOGASTRODUODENOSCOPY (EGD) WITH PROPOFOL ;  Surgeon: Albertina Hugger, MD;  Location: [facility];  Service: Gastroenterology;  Laterality: N/A;   GASTRIC STIMULATOR IMPLANT SURGERY  06/2022   at [facility]   I & D EXTREMITY Bilateral 10/12/2022   Procedure: IRRIGATION AND  DEBRIDEMENT  BILATERAL LEGS;  Surgeon: Hardy Lia, MD;  Location: [facility] OR;  Service: Orthopedics;  Laterality: Bilateral;   I & D EXTREMITY Right 10/13/2022   Procedure: RIGHT THIGH WOUND VAC EXCHANGE;  Surgeon: Laneta Pintos, MD;  Location: [facility] OR;  Service: Orthopedics;  Laterality: Right;   I & D EXTREMITY Bilateral 10/17/2022   Procedure: BILATERAL THIGH AND LEG DEBRIDEMENT, PARTIAL BILATERAL FOOT AMPUTATION;  Surgeon: Timothy Ford, MD;  Location: [facility] OR;  Service: Orthopedics;  Laterality: Bilateral;   I & D EXTREMITY Bilateral 11/05/2022   Procedure: BILATERAL THIGH DEBRIDEMENT;  Surgeon: Timothy Ford, MD;  Location: [facility] OR;  Service: Orthopedics;  Laterality: Bilateral;   I & D EXTREMITY Bilateral 11/07/2022   Procedure: BILATERAL THIGH AND LEG DEBRIDEMENT;  Surgeon: Timothy Ford, MD;  Location: [facility] OR;  Service: Orthopedics;  Laterality: Bilateral;   I & D EXTREMITY Bilateral 11/12/2022   Procedure: BILATERAL LEG DEBRIDEMENTS;  Surgeon: Timothy Ford, MD;  Location: [facility] OR;  Service: Orthopedics;  Laterality: Bilateral;   IRRIGATION AND DEBRIDEMENT FOOT Bilateral 10/05/2022   Procedure: IRRIGATION AND DEBRIDEMENT FOOT;  Surgeon: Dot Gazella, DPM;  Location: [facility];  Service: Orthopedics/Podiatry;  Laterality: Bilateral;   WISDOM TOOTH EXTRACTION     Patient Active Problem List   Diagnosis Date Noted   Influenza A 04/05/2023   Elevated lipase 04/05/2023   Esophagitis  03/24/2023   SIRS (systemic inflammatory response syndrome) (HCC) 03/02/2023   Fever 03/02/2023   Metabolic acidosis 03/02/2023   Hypertensive urgency 02/21/2023   Chronic pain 02/21/2023   Abscess of thigh 11/28/2022   C. 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   Hypokalemia 11/27/2022   Effusion, left knee 11/05/2022   MDD (major depressive disorder), recurrent episode, moderate (HCC) 10/16/2022   Necrotizing fasciitis of pelvic region and thigh (HCC) 10/08/2022   Necrotizing fasciitis of lower leg (HCC) 10/08/2022   Necrotizing fasciitis (HCC) 10/08/2022   Sepsis due to cellulitis (HCC) 10/03/2022   Cellulitis of lower extremity 10/03/2022   Multinodular goiter 06/18/2022   Malnutrition of moderate degree 04/18/2022   Intractable vomiting with nausea 04/17/2022   DKA (diabetic ketoacidosis) (HCC) 03/04/2022   Nausea vomiting and diarrhea 12/25/2021   Diabetic gastroparesis (HCC) 10/09/2021   Drug-seeking behavior 09/21/2021   Essential hypertension 08/25/2021   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) 08/25/2021   GERD without esophagitis 08/25/2021   Hematemesis    Prolonged Q-T interval on ECG    Nausea and  vomiting 07/20/2021   Anemia of chronic disease 06/06/2021   Dehydration 06/04/2021   Hypomagnesemia 04/21/2021   Ulcerated, foot, left, with fat layer exposed (HCC) 04/20/2021   Uncontrolled type 1 diabetes mellitus with hyperglycemia, with long-term current use of insulin  (HCC) 12/18/2020   DM type 1 (diabetes mellitus, type 1) (HCC) 12/18/2020   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) 11/09/2020   Diabetic retinopathy of both eyes associated with type 1 diabetes mellitus (HCC) 09/24/2020   Diabetic gastroparesis associated with type 1 diabetes mellitus (HCC) 08/12/2020   Acute kidney injury superimposed on chronic kidney disease (HCC) 07/25/2020   Generalized abdominal pain  07/23/2020   GERD (gastroesophageal reflux disease) 07/23/2020   Diabetic nephropathy associated with type 1 diabetes mellitus (HCC) 06/27/2020   Sepsis (HCC) 05/27/2020   HLD (hyperlipidemia) 05/23/2020   Sinus tachycardia 05/09/2020   Anemia 04/20/2020   Depression with anxiety 07/05/2019   Diabetic polyneuropathy associated with type 1 diabetes mellitus (HCC) 09/24/2017   Gastroparesis    Goiter 10/05/2009   DM2 (diabetes mellitus, type 2) (HCC) 10/05/2009   Hypertension associated with diabetes (HCC) 10/05/2009   ONSET DATE: 04/16/23 (referral date)  REFERRING DIAG: [id number] (ICD-10-CM) - S/P AKA (above knee amputation) bilateral   THERAPY DIAG:  Muscle weakness (generalized)  Other symptoms and signs involving the musculoskeletal system  Visuospatial deficit  Rationale for Evaluation and Treatment: Rehabilitation  SUBJECTIVE:   SUBJECTIVE STATEMENT: Pt reported no updates. OT noted pt appeared to have lower energy levels today. When asked, pt reported not getting much sleep last night. Pt reported potentially did not take correct dose of sleep medication (pt questioning if she needed to take 2 pills instead of 1 pill.) Pt reported "I'm not 100% today."  Pt accompanied by: self,  friend Carlus Chihuahua)  PERTINENT HISTORY: DM, diabetic gastroparesis, GERD, HTN, hyperthyroidism   PRECAUTIONS: Fall; B diabetic retinopathy   WEIGHT BEARING RESTRICTIONS: No  PAIN:  Are you having pain? R hip: 5/10 pain level  FALLS: Has patient fallen in last 6 months? Yes. Number of falls 1 - around Thanksgiving when attempting to transfer to a second w/c  06/08/23 - Pt reported falling out of w/c approx. 2 weeks ago. Pt scraped R elbow but denied additional injuries.  LIVING ENVIRONMENT:  Lives with: lives alone Lives in: House/apartment Stairs: No Has following equipment at home: Wheelchair (manual) and shower chair; aide 7 days/week for 4 hours  PLOF: Independent with basic ADLs,  community mobility: assistance from friends  PATIENT GOALS: "I want to work on standing and balance"  OBJECTIVE:  Note: Objective measures were completed at Evaluation unless otherwise noted.  Vitals:   06/08/23 1404  BP: (!) 155/94  Pulse: 100       HAND DOMINANCE: Right  ADLs: Overall ADLs: ind, cautious Eating: ind Grooming: ind UB Dressing: ind, some difficulty with donning standard bras though no difficulty with donning sports bras LB Dressing: ind Toileting: ind Bathing: prefers at least direct or distant supervision though has completed ind - "I'm scared because shower chair moves a little bit because it's a little bigger than the tub so I feel better when someone is there." Tub Shower transfers: tub transfer bench Equipment: tub transfer bench, tub/shower  IADLs: Meal Prep: difficulty cutting food for meal prep/cooking when attempting to stand. Pt reported typically using microwave meals and sometimes using stove/oven. Pt reported trying to not be afraid of doing "normal stuff" again.  Handwriting:  Pt reported  no need for handwriting, decreased vision.  Pt reported using voice text to communicate using phone.  MOBILITY STATUS:  w/c, reported standing in prosthetics only when coming to therapy though will attempt to stand at home with another person to supervise  soon  POSTURE COMMENTS:  rounded shoulders and forward head  ACTIVITY TOLERANCE: Activity tolerance: about the same as PLOF, more tired after standing tasks though only for "just 10 minutes"  FUNCTIONAL OUTCOME MEASURES: PSFS: 6.7   Total score = sum of the activity scores/number of activities Minimum detectable change (90%CI) for average score = 2 points Minimum detectable change (90%CI) for single activity score = 3 points  06/04/23 - 8.7   UPPER EXTREMITY ROM:    Active ROM Right eval Left eval  Shoulder flexion [facility]  Shoulder abduction    Shoulder adduction    Shoulder extension     Shoulder internal rotation    Shoulder external rotation    Elbow flexion    Elbow extension    Wrist flexion    Wrist extension    Wrist ulnar deviation    Wrist radial deviation    Wrist pronation    Wrist supination    (Blank rows = not tested)  UPPER EXTREMITY MMT:     MMT Right eval Left eval  Shoulder flexion    Shoulder abduction    Shoulder adduction    Shoulder extension    Shoulder internal rotation    Shoulder external rotation    Middle trapezius    Lower trapezius    Elbow flexion    Elbow extension    Wrist flexion    Wrist extension    Wrist ulnar deviation    Wrist radial deviation    Wrist pronation    Wrist supination    (Blank rows = not tested)  HAND FUNCTION: Grip strength: Right: 57 lbs; Left: 39 lbs  Pt reported some difficulty with tasks requiring strength of L side. Pt reported noticing weakness of L side compared to R side.  COORDINATION: 9 Hole Peg test: Right: 37 sec; Left: 33 sec, decreased speed secondary to visual deficits  SENSATION: Pt denied numbness/tingling/discomfort of BLE and BUE.  EDEMA: R eyelid swollen.  MUSCLE TONE: RUE: Within functional limits and LUE: Within functional limits  COGNITION: Overall cognitive status: Within functional limits for tasks assessed  VISION: Subjective report: Corrective lens no longer help, low vision per pt, better seeing close up. Pt reported some peripheral vision L side, less peripheral vision R side. Baseline vision: B diabetic retinopathy: "it's low."  Visual history:  B diabetic retinopathy  VISION ASSESSMENT: Visual tracking: Difficulty tracking black pen. Improved ability to track brightly colored marker to all 4 quadrants.  PERCEPTION: Not tested  PRAXIS: Not tested  OBSERVATIONS: Pt in w/c without prosthetics though with BLE prosthetics available nearby. Pt pleasant with decreased vision secondary to B retinopathy. Pt's friend assisted pt with w/c mobility today.  TREATMENT:    Self-Care Vitals assessed, BP noted to be slightly elevated. OT noted pt just completed PT session tasks prior to OT session and pt reported poor quality sleep previous night, which may impact BP readings. OT educated pt on factors which may impact BP. Pt and friend verbalized understanding. Pt denied symptoms, therefore tasks continued.  TherAct OT assessed pt's progress towards goals, see below for updates.  OT educated pt to continue to complete HEP after D/C today to continue to target grip strength and endurance of BUE. Pt and friend acknowledged understanding.  Flex bar (yellow) - supination, pronation, twist - 10 reps, 2 sets each - to improve BUE grip strength and coordination.   Seated, placing/removing resistive clips - yellow and red with plain background to increase contrast for visual scanning - to improve BUE strengthening, coordination, and functional reach.  OT educated pt on OT D/C today. Pt agreeable and confirmed no questions/concerns at this time. Pt asked about option to return to OT for "check-in" later this year. OT educated pt that a new referral will be required for additional OT services. OT educated pt on potential OT role after pt builds additional standing tolerance using BLE and when pt's new w/c arrives. Pt and friend acknowledged understanding.   PATIENT EDUCATION: Education details: see today's tx above Person educated: Patient and friend Education method: Explanation, Demonstration, Verbal cues, and Handouts Education comprehension: verbalized understanding, returned demonstration, verbal cues required, and needs further education  HOME EXERCISE PROGRAM: 05/18/2023: putty and vision strategies 05/25/2023: O&M training 05/28/23 - red theraband LUE - Access Code: [email or web address]  GOALS: Goals reviewed with patient? Yes  SHORT TERM GOALS:  Target date: 05/29/23  Pt will recall or demo at least 2 visual compensation strategies Baseline: impaired vision secondary to diabetic retinopathy 05/21/23 - Pt ind recalled using line guide, turning head to look to R, using finger to visually track information.  Goal status: MET  2.  Pt will verbalize or demo understanding of safe simulated shower transfers modI with A/E and adaptive strategies PRN. Baseline: Pt ind though fearful and prefers supervision during showers and shower transfers d/t unstable shower chair/bench. Pt has tub transfer bench and transfer board available.  05/28/23 - Pt reported shower transfers are "easy." Pt reported going slow and feeling confident with transfers. Goal status: MET  3.  Pt will ind demo understanding of LUE HEP. Baseline: New to outpt OT 05/21/23 - Pt returned demo of theraputty.  Goal status: MET  4. Pt will verbalize understanding of community resources for individuals with amputations.  Baseline: new to outpt OT  05/21/23 - Pt verbalized understanding of resources.  Goal status: MET  LONG TERM GOALS: Target date: 06/26/23  Pt will ind demo understanding of updated HEP. Baseline: new to outpt OT 06/04/23 - Pt reported completing exercises. Pt returned demo of theraputty and red theraband exercises.  Goal status: MET  2.  Patient will report at least two-point increase in average PSFS score or at least three-point increase in a single activity score indicating functionally significant improvement given minimum detectable change using BLE prosthetics PRN. Baseline: 6.7 total score (See above for individual activity scores) without use of BLE prosthetics. 06/04/23 - 8.7 Goal status: MET using adaptive strategies and A/E  3.  Pt will demo at least 45 lbs LUE grip strength as needed to open jars/containers. Baseline: Grip strength: Right: 57 lbs; Left: 39 lbs 05/25/2023: LUE: 38 lbs 06/04/23 - LUE: 42.5. Pt reported feeling like LUE  grip strength is  weaker than preferred.  06/08/23 - LUE: 41.2 lbs Goal status: not met but good progress, recommended to pt to continue HEP to build grip strength.  4.  Pt will complete simple meal prep tasks modI using BLE prosthetics as able. Baseline: pt beginning to trial use of BLE prosthetics. Pt reported difficulty with meal prep tasks currently. Goal status: MET  ASSESSMENT:  CLINICAL IMPRESSION: Pt demo'd increased lethargy today likely d/t not sleeping well last night based on pt report. Pt requested to end OT session early d/t OT D/C today and d/t pt reported preference to go home to rest.   Pt has met 100% of STG and 3 out of 4 LTG to satisfactory levels. Pt is continuing to address tolerance for standing and walking using BLE prosthetics in PT sessions. See PT notes for additional details. Pt has all needed materials and education for OT. Pt understands how to continue on with self-management. Pt agreeable to D/C today. Patient met majority of rehab goals. Patient is being discharged due to meeting the majority of stated rehab goals and pt is pleased with current functional level as pertaining to OT. OT to hold therapy for now to allow time to build standing tolerance using BLE and allow time for new w/c to arrive. Per pt, new w/c to be "hopefully" delivered in June 2025. A new referral will be required to resume OT services if pt wishes to return to OT to address additional functional tasks when using BLE prosthetics or when using new w/c.  PERFORMANCE DEFICITS: in functional skills including ADLs, IADLs, coordination, dexterity, proprioception, edema, strength, flexibility, Fine motor control, Gross motor control, mobility, balance, body mechanics, endurance, decreased knowledge of use of DME, vision, and UE functional use, cognitive skills including energy/drive, and psychosocial skills including environmental adaptation.   IMPAIRMENTS: are limiting patient from ADLs, IADLs, rest and sleep, work,  leisure, and social participation.   CO-MORBIDITIES: may have co-morbidities  that affects occupational performance. Patient will benefit from skilled OT to address above impairments and improve overall function.  REHAB POTENTIAL: Good  PLAN:  OT FREQUENCY: 2x/week  OT DURATION: 8 weeks  PLANNED INTERVENTIONS: 97168 OT Re-evaluation, 97535 self care/ADL training, 16109 therapeutic exercise, 97530 therapeutic activity, 97112 neuromuscular re-education, 97140 manual therapy, 97035 ultrasound, 97018 paraffin, 60454 fluidotherapy, 97010 moist heat, 97010 cryotherapy, 97034 contrast bath, 97760 Orthotics management and training, 09811 Splinting (initial encounter), [id number] Subsequent splinting/medication, passive range of motion, functional mobility training, visual/perceptual remediation/compensation, energy conservation, patient/family education, and DME and/or AE instructions  RECOMMENDED OTHER SERVICES: PT eval already completed  CONSULTED AND AGREED WITH PLAN OF CARE: Patient  PLAN FOR NEXT SESSION:  N/A - OT D/C completed - 06/08/23   Oakley Bellman, OT 06/08/2023, 2:58 PM

## 2023-06-08 NOTE — Therapy (Signed)
 OUTPATIENT PHYSICAL THERAPY NEURO TREATMENT   Patient Name: Quintessa Brianna Johnson-Alston MRN: [id number] DOB:07/03/1990, 33 y.o., female Today's Date: 06/08/2023   PCP: Vevelyn Gowers, NP REFERRING PROVIDER: Gearldean Keepers, MD  END OF SESSION:  PT End of Session - 06/08/23 1315     Visit Number 15    Number of Visits 25    Date for PT Re-Evaluation 07/10/23    Authorization Type Medicare/Medicaid    Progress Note Due on Visit 20    PT Start Time 1313    PT Stop Time 1355    PT Time Calculation (min) 42 min    Equipment Utilized During Treatment Gait belt    Activity Tolerance Patient tolerated treatment well    Behavior During Therapy WFL for tasks assessed/performed             Past Medical History:  Diagnosis Date   Acute H. pylori gastric ulcer    Coffee ground emesis    Diabetes mellitus (HCC)    Diabetic gastroparesis (HCC)    DKA (diabetic ketoacidosis) (HCC) 02/24/2021   Gastroparesis    GERD (gastroesophageal reflux disease)    Hypertension    Hyperthyroidism    Intractable nausea and vomiting 04/20/2021   Normocytic anemia 04/20/2020   Prolonged Q-T interval on ECG    Past Surgical History:  Procedure Laterality Date   AMPUTATION Bilateral 10/24/2022   Procedure: BILATERAL BELOW KNEE AMPUTATION;  Surgeon: Timothy Ford, MD;  Location: [facility] OR;  Service: Orthopedics;  Laterality: Bilateral;   AMPUTATION Bilateral 10/08/2022   Procedure: BILATERAL LEG DEBRIDEMENT;  Surgeon: Timothy Ford, MD;  Location: [facility] OR;  Service: Orthopedics;  Laterality: Bilateral;   AMPUTATION Bilateral 11/14/2022   Procedure: BILATERAL ABOVE KNEE AMPUTATION;  Surgeon: Timothy Ford, MD;  Location: [facility] OR;  Service: Orthopedics;  Laterality: Bilateral;   AMPUTATION TOE Left 03/10/2018   Procedure: AMPUTATION FIFTH TOE;  Surgeon: Charity Conch, DPM;  Location: [facility];  Service: Podiatry;  Laterality: Left;   APPLICATION OF WOUND VAC Bilateral 10/12/2022    Procedure: APPLICATION OF WOUND VAC BILATERAL LEGS;  Surgeon: Hardy Lia, MD;  Location: [facility] OR;  Service: Orthopedics;  Laterality: Bilateral;   APPLICATION OF WOUND VAC Bilateral 10/24/2022   Procedure: APPLICATION OF WOUND VAC;  Surgeon: Timothy Ford, MD;  Location: [facility] OR;  Service: Orthopedics;  Laterality: Bilateral;   BIOPSY  01/28/2020   Procedure: BIOPSY;  Surgeon: Felecia Hopper, MD;  Location: [facility];  Service: Gastroenterology;;   BOTOX  INJECTION  08/26/2021   Procedure: BOTOX  INJECTION;  Surgeon: Albertina Hugger, MD;  Location: [facility];  Service: Gastroenterology;;   ESOPHAGOGASTRODUODENOSCOPY N/A 01/28/2020   Procedure: ESOPHAGOGASTRODUODENOSCOPY (EGD);  Surgeon: Felecia Hopper, MD;  Location: [facility];  Service: Gastroenterology;  Laterality: N/A;   ESOPHAGOGASTRODUODENOSCOPY (EGD) WITH PROPOFOL  Left 09/08/2015   Procedure: ESOPHAGOGASTRODUODENOSCOPY (EGD) WITH PROPOFOL ;  Surgeon: Evangeline Hilts, MD;  Location: [facility];  Service: Endoscopy;  Laterality: Left;   ESOPHAGOGASTRODUODENOSCOPY (EGD) WITH PROPOFOL  N/A 08/26/2021   Procedure: ESOPHAGOGASTRODUODENOSCOPY (EGD) WITH PROPOFOL ;  Surgeon: Albertina Hugger, MD;  Location: [facility];  Service: Gastroenterology;  Laterality: N/A;   GASTRIC STIMULATOR IMPLANT SURGERY  06/2022   at [facility]   I & D EXTREMITY Bilateral 10/12/2022   Procedure: IRRIGATION AND  DEBRIDEMENT  BILATERAL LEGS;  Surgeon: Hardy Lia, MD;  Location: [facility] OR;  Service: Orthopedics;  Laterality: Bilateral;   I & D EXTREMITY Right 10/13/2022   Procedure: RIGHT THIGH WOUND  VAC EXCHANGE;  Surgeon: Laneta Pintos, MD;  Location: [facility] OR;  Service: Orthopedics;  Laterality: Right;   I & D EXTREMITY Bilateral 10/17/2022   Procedure: BILATERAL THIGH AND LEG DEBRIDEMENT, PARTIAL BILATERAL FOOT AMPUTATION;  Surgeon: Timothy Ford, MD;  Location: [facility] OR;  Service: Orthopedics;  Laterality: Bilateral;   I & D EXTREMITY Bilateral 11/05/2022    Procedure: BILATERAL THIGH DEBRIDEMENT;  Surgeon: Timothy Ford, MD;  Location: [facility] OR;  Service: Orthopedics;  Laterality: Bilateral;   I & D EXTREMITY Bilateral 11/07/2022   Procedure: BILATERAL THIGH AND LEG DEBRIDEMENT;  Surgeon: Timothy Ford, MD;  Location: [facility] OR;  Service: Orthopedics;  Laterality: Bilateral;   I & D EXTREMITY Bilateral 11/12/2022   Procedure: BILATERAL LEG DEBRIDEMENTS;  Surgeon: Timothy Ford, MD;  Location: [facility] OR;  Service: Orthopedics;  Laterality: Bilateral;   IRRIGATION AND DEBRIDEMENT FOOT Bilateral 10/05/2022   Procedure: IRRIGATION AND DEBRIDEMENT FOOT;  Surgeon: Dot Gazella, DPM;  Location: [facility];  Service: Orthopedics/Podiatry;  Laterality: Bilateral;   WISDOM TOOTH EXTRACTION     Patient Active Problem List   Diagnosis Date Noted   Influenza A 04/05/2023   Elevated lipase 04/05/2023   Esophagitis 03/24/2023   SIRS (systemic inflammatory response syndrome) (HCC) 03/02/2023   Fever 03/02/2023   Metabolic acidosis 03/02/2023   Hypertensive urgency 02/21/2023   Chronic pain 02/21/2023   Abscess of thigh 11/28/2022   C. 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   Hypokalemia 11/27/2022   Effusion, left knee 11/05/2022   MDD (major depressive disorder), recurrent episode, moderate (HCC) 10/16/2022   Necrotizing fasciitis of pelvic region and thigh (HCC) 10/08/2022   Necrotizing fasciitis of lower leg (HCC) 10/08/2022   Necrotizing fasciitis (HCC) 10/08/2022   Sepsis due to cellulitis (HCC) 10/03/2022   Cellulitis of lower extremity 10/03/2022   Multinodular goiter 06/18/2022   Malnutrition of moderate degree 04/18/2022   Intractable vomiting with nausea 04/17/2022   DKA (diabetic ketoacidosis) (HCC) 03/04/2022   Nausea vomiting and diarrhea 12/25/2021   Diabetic gastroparesis (HCC) 10/09/2021   Drug-seeking behavior 09/21/2021   Essential hypertension 08/25/2021   Diabetes mellitus type 1 with  complications (HCC) 08/25/2021   GERD without esophagitis 08/25/2021   Hematemesis    Prolonged Q-T interval on ECG    Nausea and vomiting 07/20/2021   Anemia of chronic disease 06/06/2021   Dehydration 06/04/2021   Hypomagnesemia 04/21/2021   Ulcerated, foot, left, with fat layer exposed (HCC) 04/20/2021   Uncontrolled type 1 diabetes mellitus with hyperglycemia, with long-term current use of insulin  (HCC) 12/18/2020   DM type 1 (diabetes mellitus, type 1) (HCC) 12/18/2020   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) 11/09/2020   Diabetic retinopathy of both eyes associated with type 1 diabetes mellitus (HCC) 09/24/2020   Diabetic gastroparesis associated with type 1 diabetes mellitus (HCC) 08/12/2020   Acute kidney injury superimposed on chronic kidney disease (HCC) 07/25/2020   Generalized abdominal pain 07/23/2020   GERD (gastroesophageal reflux disease) 07/23/2020   Diabetic nephropathy associated with type 1 diabetes mellitus (HCC) 06/27/2020   Sepsis (HCC) 05/27/2020   HLD (hyperlipidemia) 05/23/2020   Sinus tachycardia 05/09/2020   Anemia 04/20/2020   Depression with anxiety 07/05/2019   Diabetic polyneuropathy associated with type 1 diabetes mellitus (HCC) 09/24/2017   Gastroparesis    Goiter 10/05/2009   DM2 (diabetes mellitus, type 2) (HCC) 10/05/2009   Hypertension associated with diabetes (HCC)  10/05/2009    ONSET DATE: 04/07/23 referral  REFERRING DIAG: [phone] (ICD-10-CM) - S/P AKA (above knee amputation) bilateral (HCC)   THERAPY DIAG:  Muscle weakness (generalized)  Abnormal posture  Other abnormalities of gait and mobility  Rationale for Evaluation and Treatment: Rehabilitation  SUBJECTIVE:                                                                                                                                                                                             SUBJECTIVE STATEMENT: Patient reports doing  well- had a good birthday. Does endorse some R hip soreness and states she heard a "pop." Denies falls.   Pt accompanied by: friend  PERTINENT HISTORY: DM, diabetic gastroparesis, GERD, HTN, hyperthyroidism  PAIN:  Are you having pain? No  VITALS: There were no vitals filed for this visit.  PRECAUTIONS: Fall; B diabetic retinopathy  PATIENT GOALS: "to be able to walk again"                                                                                                                              TREATMENT Gait: -65ft + 90* turn with RW + MinA -52ft with RW + CGA +90* turn -46ft with RW + CGA -41ft with RW + CGA -intermittent verbal cues for wider BOS and intermittent assist to advance RW  PATIENT EDUCATION: Education details: Continue HEP Person educated: Patient and friend Education method: Medical illustrator Education comprehension: verbalized understanding and needs further education  HOME EXERCISE PROGRAM: -practice donning/doffing prosthetic Access Code: [email or web address] URL: [email or web address] Date: 04/20/2023 Prepared by: Nickola Baron  Exercises - Prone Hip Extension with Residual Limb (BKA)  - 1 x daily - 7 x weekly - 3 sets - 10 reps - Sidelying Hip Abduction (AKA)  - 1 x daily - 7 x weekly - 3 sets - 10 reps - Supine Piriformis Stretch with Residual Limb (AKA)  - 1 x daily - 7 x weekly - 3 sets - 10 reps  Do each exercise 1-2  times per day Do each exercise 10 repetitions Hold each  exercise for 2 seconds to feel your location  AT [facility] FIND YOUR MIDLINE POSITION AND PLACE FEET EQUAL DISTANCE FROM THE MIDLINE.  Try to find this position when standing still for activities.   USE TAPE ON FLOOR TO MARK THE MIDLINE POSITION. You also should try to feel with your limb pressure in socket.  You are trying to feel with limb what you used to feel with the bottom of your foot.  Side to Side Shift: Moving your hips only (not shoulders): move  weight onto your left leg, HOLD/FEEL.  Move back to equal weight on each leg, HOLD/FEEL. Move weight onto your right leg, HOLD/FEEL. Move back to equal weight on each leg, HOLD/FEEL. Repeat.  Start with both hands on sink, progress to right hand only, then no hands.  Front to Back Shift: Moving your hips only (not shoulders): move your weight forward onto your toes, HOLD/FEEL. Move your weight back to equal Flat Foot on both legs, HOLD/FEEL. Move your weight back onto your heels, HOLD/FEEL. Move your weight back to equal on both legs, HOLD/FEEL. Repeat.   tart with both hands on sink, progress to right hand only, then no hands. Moving Cones / Cups: With equal weight on each leg: Hold on with one hand the first time, then progress to no hand supports. Move cups from one side of sink to the other. Place cups ~2" out of your reach, progress to 10" beyond reach.  Place one hand in middle of sink and reach with other hand. Do both arms.  Then hover one hand and move cups with other hand. -just use a crossbody reach to end of safe weight shift ROM Overhead/Upward Reaching: alternated reaching up to top cabinets or ceiling if no cabinets present. Keep equal weight on each leg. Start with one hand support on counter while other hand reaches and progress to no hand support with reaching.  ace one hand in middle of sink and reach with other hand. Do both arms.  Then hover one hand and move cups with other hand.  5.   Looking Over Shoulders: With equal weight on each leg: alternate turning to look over your shoulders with one hand support on counter as needed.  Start with head motions only to look in front of shoulder, then even with shoulder and progress to looking behind you. To look to side, move head /eyes, then shoulder on side looking pulls back, shift more weight to side looking and pull hip back. ace one hand in middle of sink and let go with other hand so your shoulder can pull back. Switch hands to look other  way.   Then hover one hand and move cups with other hand.  6.  Stepping with leg that is not amputated:  Move items under cabinet out of your way. Shift your hips/pelvis so weight on prosthesis. SLOWLY step other leg so front of foot is in cabinet. Then step back to floor. - just hike the hip and place the foot back in the starting spot  GOALS: Goals reviewed with patient? Yes  SHORT TERM GOALS: Target date: 05/15/23  Pt will be independent with initial HEP for improved gait mechanics and balance  Baseline: to be updated, reports she has not been doing at home doesn't trust CNA Goal status: NOT MET  2.  Patient will ambulate at least 107ft with LRAD and no more than MinA to demonstrate improved mobility  Baseline: 60ft // bars + MinA, 10 feet with  2WW + minA Goal status: NOT met  3.  Patient will complete sit <> stand with LRAD and no more than CGA for improved independence with transfers Baseline: MinA, required minA in today's session  Goal status: NOT met - required minA due to posterior instability  4.  Patient will complete floor transfer with assist as initial step of fall recovery  Baseline: unable to complete on eval; MinA Goal status: MET   LONG TERM GOALS: Target date: 07/10/23  Pt will be independent with final HEP for improved gait mechanics and balance  Baseline: to be updated Goal status: INITIAL  Patient will ambulate at least 12ft with LRAD, ModI to demonstrate improved mobility  Baseline: 60ft // bars + MinA Goal status: INITIAL  Patient will complete sit <> stand with LRAD. SBA for improved independence with transfers Baseline: MinA Goal status: REVISED  Patient will complete floor transfer without assist as initial step of fall recovery  Baseline: unable to complete on eval Goal status: INITIAL  5. Patient will don/doff prosthetics independently   Baseline: MaxA  Goal status: INITIAL  ASSESSMENT:  CLINICAL IMPRESSION: Patient seen for skilled PT  session with emphasis on progressing gait distance and mechanics. Utilized low bars of adult RW for improved posture and kinematics of gait with success. She remains limited in distance to short household/household distances with grossly CGA/MinA, but this is greatly improving. She denies significant increase in R hip pain, but does endorse general muscle soreness, primarily in her quads. Continue POC.    OBJECTIVE IMPAIRMENTS: Abnormal gait, decreased activity tolerance, decreased balance, decreased endurance, decreased knowledge of condition, decreased knowledge of use of DME, decreased strength, impaired vision/preception, and prosthetic dependency .   ACTIVITY LIMITATIONS: carrying, lifting, standing, squatting, transfers, hygiene/grooming, locomotion level, and caring for others  PARTICIPATION LIMITATIONS: meal prep, cleaning, interpersonal relationship, driving, shopping, community activity, and occupation  PERSONAL FACTORS: Age, Behavior pattern, Past/current experiences, Social background, Time since onset of injury/illness/exacerbation, Transportation, and 3+ comorbidities: see above  are also affecting patient's functional outcome.   REHAB POTENTIAL: Good  CLINICAL DECISION MAKING: Stable/uncomplicated  EVALUATION COMPLEXITY: Low  PLAN:  PT FREQUENCY: 2x/week  PT DURATION: 12 weeks  PLANNED INTERVENTIONS: 97164- PT Re-evaluation, 97110-Therapeutic exercises, 97530- Therapeutic activity, 97112- Neuromuscular re-education, 97535- Self Care, 47829- Manual therapy, [phone]- Gait training, [phone]- Prosthetic training, [phone]- Aquatic Therapy, Patient/Family education, Balance training, Stair training, Scar mobilization, Vestibular training, Visual/preceptual remediation/compensation, DME instructions, and Wheelchair mobility training  PLAN FOR NEXT SESSION: Gait, balance, floor transfers  how is modified sink HEP?, balance recovery in standing/static stability w/ dec UE reliance, walking w/  2WW   Rebecca Campus, PT Rebecca Campus, PT, DPT, CBIS   06/08/2023, 2:10 PM

## 2023-06-11 ENCOUNTER — Ambulatory Visit: Admitting: Occupational Therapy

## 2023-06-11 ENCOUNTER — Encounter: Payer: Self-pay | Admitting: Physical Therapy

## 2023-06-11 ENCOUNTER — Ambulatory Visit: Payer: 59 | Admitting: Physical Therapy

## 2023-06-11 DIAGNOSIS — R293 Abnormal posture: Secondary | ICD-10-CM

## 2023-06-11 DIAGNOSIS — R2689 Other abnormalities of gait and mobility: Secondary | ICD-10-CM

## 2023-06-11 DIAGNOSIS — M6281 Muscle weakness (generalized): Secondary | ICD-10-CM | POA: Diagnosis not present

## 2023-06-11 NOTE — Therapy (Signed)
 OUTPATIENT PHYSICAL THERAPY NEURO TREATMENT   Patient Name: Kelli Hudson MRN: [id number] DOB:03-01-1990, 33 y.o., female Today's Date: 06/11/2023   PCP: Vevelyn Gowers, NP REFERRING PROVIDER: Gearldean Keepers, MD  END OF SESSION:  PT End of Session - 06/11/23 1306     Visit Number 16    Number of Visits 25    Date for PT Re-Evaluation 07/10/23    Authorization Type Medicare/Medicaid    Progress Note Due on Visit 20    PT Start Time 1304    PT Stop Time 1320    PT Time Calculation (min) 16 min    Equipment Utilized During Treatment Gait belt    Activity Tolerance Treatment limited secondary to medical complications (Comment)   elevated BP   Behavior During Therapy WFL for tasks assessed/performed             Past Medical History:  Diagnosis Date   Acute H. pylori gastric ulcer    Coffee ground emesis    Diabetes mellitus (HCC)    Diabetic gastroparesis (HCC)    DKA (diabetic ketoacidosis) (HCC) 02/24/2021   Gastroparesis    GERD (gastroesophageal reflux disease)    Hypertension    Hyperthyroidism    Intractable nausea and vomiting 04/20/2021   Normocytic anemia 04/20/2020   Prolonged Q-T interval on ECG    Past Surgical History:  Procedure Laterality Date   AMPUTATION Bilateral 10/24/2022   Procedure: BILATERAL BELOW KNEE AMPUTATION;  Surgeon: Timothy Ford, MD;  Location: [facility] OR;  Service: Orthopedics;  Laterality: Bilateral;   AMPUTATION Bilateral 10/08/2022   Procedure: BILATERAL LEG DEBRIDEMENT;  Surgeon: Timothy Ford, MD;  Location: [facility] OR;  Service: Orthopedics;  Laterality: Bilateral;   AMPUTATION Bilateral 11/14/2022   Procedure: BILATERAL ABOVE KNEE AMPUTATION;  Surgeon: Timothy Ford, MD;  Location: [facility] OR;  Service: Orthopedics;  Laterality: Bilateral;   AMPUTATION TOE Left 03/10/2018   Procedure: AMPUTATION FIFTH TOE;  Surgeon: Charity Conch, DPM;  Location: [facility];  Service: Podiatry;  Laterality: Left;   APPLICATION  OF WOUND VAC Bilateral 10/12/2022   Procedure: APPLICATION OF WOUND VAC BILATERAL LEGS;  Surgeon: Hardy Lia, MD;  Location: [facility] OR;  Service: Orthopedics;  Laterality: Bilateral;   APPLICATION OF WOUND VAC Bilateral 10/24/2022   Procedure: APPLICATION OF WOUND VAC;  Surgeon: Timothy Ford, MD;  Location: [facility] OR;  Service: Orthopedics;  Laterality: Bilateral;   BIOPSY  01/28/2020   Procedure: BIOPSY;  Surgeon: Felecia Hopper, MD;  Location: [facility];  Service: Gastroenterology;;   BOTOX  INJECTION  08/26/2021   Procedure: BOTOX  INJECTION;  Surgeon: Albertina Hugger, MD;  Location: [facility];  Service: Gastroenterology;;   ESOPHAGOGASTRODUODENOSCOPY N/A 01/28/2020   Procedure: ESOPHAGOGASTRODUODENOSCOPY (EGD);  Surgeon: Felecia Hopper, MD;  Location: [facility];  Service: Gastroenterology;  Laterality: N/A;   ESOPHAGOGASTRODUODENOSCOPY (EGD) WITH PROPOFOL  Left 09/08/2015   Procedure: ESOPHAGOGASTRODUODENOSCOPY (EGD) WITH PROPOFOL ;  Surgeon: Evangeline Hilts, MD;  Location: [facility] END[facility]COPY;  Service: Endoscopy;  Laterality: Left;   ESOPHAGOGASTRODUODENOSCOPY (EGD) WITH PROPOFOL  N/A 08/26/2021   Procedure: ESOPHAGOGASTRODUODENOSCOPY (EGD) WITH PROPOFOL ;  Surgeon: Albertina Hugger, MD;  Location: [facility];  Service: Gastroenterology;  Laterality: N/A;   GASTRIC STIMULATOR IMPLANT SURGERY  06/2022   at [facility]   I & D EXTREMITY Bilateral 10/12/2022   Procedure: IRRIGATION AND  DEBRIDEMENT  BILATERAL LEGS;  Surgeon: Hardy Lia, MD;  Location: [facility] OR;  Service: Orthopedics;  Laterality: Bilateral;   I & D EXTREMITY Right 10/13/2022  Procedure: RIGHT THIGH WOUND VAC EXCHANGE;  Surgeon: Laneta Pintos, MD;  Location: [facility] OR;  Service: Orthopedics;  Laterality: Right;   I & D EXTREMITY Bilateral 10/17/2022   Procedure: BILATERAL THIGH AND LEG DEBRIDEMENT, PARTIAL BILATERAL FOOT AMPUTATION;  Surgeon: Timothy Ford, MD;  Location: [facility] OR;  Service: Orthopedics;  Laterality: Bilateral;   I & D  EXTREMITY Bilateral 11/05/2022   Procedure: BILATERAL THIGH DEBRIDEMENT;  Surgeon: Timothy Ford, MD;  Location: [facility] OR;  Service: Orthopedics;  Laterality: Bilateral;   I & D EXTREMITY Bilateral 11/07/2022   Procedure: BILATERAL THIGH AND LEG DEBRIDEMENT;  Surgeon: Timothy Ford, MD;  Location: [facility] OR;  Service: Orthopedics;  Laterality: Bilateral;   I & D EXTREMITY Bilateral 11/12/2022   Procedure: BILATERAL LEG DEBRIDEMENTS;  Surgeon: Timothy Ford, MD;  Location: [facility] OR;  Service: Orthopedics;  Laterality: Bilateral;   IRRIGATION AND DEBRIDEMENT FOOT Bilateral 10/05/2022   Procedure: IRRIGATION AND DEBRIDEMENT FOOT;  Surgeon: Dot Gazella, DPM;  Location: [facility];  Service: Orthopedics/Podiatry;  Laterality: Bilateral;   WISDOM TOOTH EXTRACTION     Patient Active Problem List   Diagnosis Date Noted   Influenza A 04/05/2023   Elevated lipase 04/05/2023   Esophagitis 03/24/2023   SIRS (systemic inflammatory response syndrome) (HCC) 03/02/2023   Fever 03/02/2023   Metabolic acidosis 03/02/2023   Hypertensive urgency 02/21/2023   Chronic pain 02/21/2023   Abscess of thigh 11/28/2022   C. 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   Hypokalemia 11/27/2022   Effusion, left knee 11/05/2022   MDD (major depressive disorder), recurrent episode, moderate (HCC) 10/16/2022   Necrotizing fasciitis of pelvic region and thigh (HCC) 10/08/2022   Necrotizing fasciitis of lower leg (HCC) 10/08/2022   Necrotizing fasciitis (HCC) 10/08/2022   Sepsis due to cellulitis (HCC) 10/03/2022   Cellulitis of lower extremity 10/03/2022   Multinodular goiter 06/18/2022   Malnutrition of moderate degree 04/18/2022   Intractable vomiting with nausea 04/17/2022   DKA (diabetic ketoacidosis) (HCC) 03/04/2022   Nausea vomiting and diarrhea 12/25/2021   Diabetic gastroparesis (HCC) 10/09/2021   Drug-seeking behavior 09/21/2021   Essential hypertension 08/25/2021    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) 08/25/2021   GERD without esophagitis 08/25/2021   Hematemesis    Prolonged Q-T interval on ECG    Nausea and vomiting 07/20/2021   Anemia of chronic disease 06/06/2021   Dehydration 06/04/2021   Hypomagnesemia 04/21/2021   Ulcerated, foot, left, with fat layer exposed (HCC) 04/20/2021   Uncontrolled type 1 diabetes mellitus with hyperglycemia, with long-term current use of insulin  (HCC) 12/18/2020   DM type 1 (diabetes mellitus, type 1) (HCC) 12/18/2020   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) 11/09/2020   Diabetic retinopathy of both eyes associated with type 1 diabetes mellitus (HCC) 09/24/2020   Diabetic gastroparesis associated with type 1 diabetes mellitus (HCC) 08/12/2020   Acute kidney injury superimposed on chronic kidney disease (HCC) 07/25/2020   Generalized abdominal pain 07/23/2020   GERD (gastroesophageal reflux disease) 07/23/2020   Diabetic nephropathy associated with type 1 diabetes mellitus (HCC) 06/27/2020   Sepsis (HCC) 05/27/2020   HLD (hyperlipidemia) 05/23/2020   Sinus tachycardia 05/09/2020   Anemia 04/20/2020   Depression with anxiety 07/05/2019   Diabetic polyneuropathy associated with type 1 diabetes mellitus (HCC) 09/24/2017   Gastroparesis    Goiter 10/05/2009   DM2 (diabetes mellitus, type 2) (HCC) 10/05/2009   Hypertension  associated with diabetes (HCC) 10/05/2009    ONSET DATE: 04/07/23 referral  REFERRING DIAG: [id number] (ICD-10-CM) - S/P AKA (above knee amputation) bilateral (HCC)   THERAPY DIAG:  Muscle weakness (generalized)  Abnormal posture  Other abnormalities of gait and mobility  Rationale for Evaluation and Treatment: Rehabilitation  SUBJECTIVE:                                                                                                                                                                                             SUBJECTIVE  STATEMENT: Patient reports no acute changes. Denies falls and near falls. Wants to work on hiking today. Reports glucose was 220 this morning.   Pt accompanied by: family member - mother Audrene Blessing  PERTINENT HISTORY: DM, diabetic gastroparesis, GERD, HTN, hyperthyroidism  PAIN:  Are you having pain? No  VITALS: There were no vitals filed for this visit.  PRECAUTIONS: Fall; B diabetic retinopathy  PATIENT GOALS: "to be able to walk again"                                                                                                                              TREATMENT  Self Care: There were no vitals filed for this visit. Seated on LUE, elevated outside of range for PT, asymptomatic   Vitals assessed as noted above Patient reports she slept in late and just took medication, PT discussed setting alarms for reminders of BP medication, reviewed appropriate and safe BP ranges and what is too elevated, discussed getting an automatic cuff at home and having someone who can read the numbers to her as she often has someone to help her Patient verbalized understanding   PATIENT EDUCATION: Education details: Continue HEP Person educated: Patient and friend Education method: Medical illustrator Education comprehension: verbalized understanding and needs further education  HOME EXERCISE PROGRAM: -practice donning/doffing prosthetic Access Code: [email or web address] URL: [email or web address] Date: 04/20/2023 Prepared by: Nickola Baron  Exercises - Prone Hip Extension with Residual Limb (BKA)  - 1 x daily - 7 x weekly - 3 sets - 10 reps - Sidelying  Hip Abduction (AKA)  - 1 x daily - 7 x weekly - 3 sets - 10 reps - Supine Piriformis Stretch with Residual Limb (AKA)  - 1 x daily - 7 x weekly - 3 sets - 10 reps  Do each exercise 1-2  times per day Do each exercise 10 repetitions Hold each exercise for 2 seconds to feel your location  AT SINK FIND YOUR MIDLINE POSITION  AND PLACE FEET EQUAL DISTANCE FROM THE MIDLINE.  Try to find this position when standing still for activities.   USE TAPE ON FLOOR TO MARK THE MIDLINE POSITION. You also should try to feel with your limb pressure in socket.  You are trying to feel with limb what you used to feel with the bottom of your foot.  Side to Side Shift: Moving your hips only (not shoulders): move weight onto your left leg, HOLD/FEEL.  Move back to equal weight on each leg, HOLD/FEEL. Move weight onto your right leg, HOLD/FEEL. Move back to equal weight on each leg, HOLD/FEEL. Repeat.  Start with both hands on sink, progress to right hand only, then no hands.  Front to Back Shift: Moving your hips only (not shoulders): move your weight forward onto your toes, HOLD/FEEL. Move your weight back to equal Flat Foot on both legs, HOLD/FEEL. Move your weight back onto your heels, HOLD/FEEL. Move your weight back to equal on both legs, HOLD/FEEL. Repeat.   tart with both hands on sink, progress to right hand only, then no hands. Moving Cones / Cups: With equal weight on each leg: Hold on with one hand the first time, then progress to no hand supports. Move cups from one side of sink to the other. Place cups ~2" out of your reach, progress to 10" beyond reach.  Place one hand in middle of sink and reach with other hand. Do both arms.  Then hover one hand and move cups with other hand. -just use a crossbody reach to end of safe weight shift ROM Overhead/Upward Reaching: alternated reaching up to top cabinets or ceiling if no cabinets present. Keep equal weight on each leg. Start with one hand support on counter while other hand reaches and progress to no hand support with reaching.  ace one hand in middle of sink and reach with other hand. Do both arms.  Then hover one hand and move cups with other hand.  5.   Looking Over Shoulders: With equal weight on each leg: alternate turning to look over your shoulders with one hand support on counter  as needed.  Start with head motions only to look in front of shoulder, then even with shoulder and progress to looking behind you. To look to side, move head /eyes, then shoulder on side looking pulls back, shift more weight to side looking and pull hip back. ace one hand in middle of sink and let go with other hand so your shoulder can pull back. Switch hands to look other way.   Then hover one hand and move cups with other hand.  6.  Stepping with leg that is not amputated:  Move items under cabinet out of your way. Shift your hips/pelvis so weight on prosthesis. SLOWLY step other leg so front of foot is in cabinet. Then step back to floor. - just hike the hip and place the foot back in the starting spot  GOALS: Goals reviewed with patient? Yes  SHORT TERM GOALS: Target date: 05/15/23  Pt will be independent with initial  HEP for improved gait mechanics and balance  Baseline: to be updated, reports she has not been doing at home doesn't trust CNA Goal status: NOT MET  2.  Patient will ambulate at least 76ft with LRAD and no more than MinA to demonstrate improved mobility  Baseline: 58ft // bars + MinA, 10 feet with 2WW + minA Goal status: NOT met  3.  Patient will complete sit <> stand with LRAD and no more than CGA for improved independence with transfers Baseline: MinA, required minA in today's session  Goal status: NOT met - required minA due to posterior instability  4.  Patient will complete floor transfer with assist as initial step of fall recovery  Baseline: unable to complete on eval; MinA Goal status: MET   LONG TERM GOALS: Target date: 07/10/23  Pt will be independent with final HEP for improved gait mechanics and balance  Baseline: to be updated Goal status: INITIAL  Patient will ambulate at least 28ft with LRAD, ModI to demonstrate improved mobility  Baseline: 83ft // bars + MinA Goal status: INITIAL  Patient will complete sit <> stand with LRAD. SBA for improved  independence with transfers Baseline: MinA Goal status: REVISED  Patient will complete floor transfer without assist as initial step of fall recovery  Baseline: unable to complete on eval Goal status: INITIAL  5. Patient will don/doff prosthetics independently   Baseline: MaxA  Goal status: INITIAL  ASSESSMENT:  CLINICAL IMPRESSION: Patient session limited by elevated BP reading as patient woke up late and just recently took medication. Provided appropriate education for BP safety and encouraged follow up with PCP and ED if becomes symtpomatic. Continue POC as able.    OBJECTIVE IMPAIRMENTS: Abnormal gait, decreased activity tolerance, decreased balance, decreased endurance, decreased knowledge of condition, decreased knowledge of use of DME, decreased strength, impaired vision/preception, and prosthetic dependency .   ACTIVITY LIMITATIONS: carrying, lifting, standing, squatting, transfers, hygiene/grooming, locomotion level, and caring for others  PARTICIPATION LIMITATIONS: meal prep, cleaning, interpersonal relationship, driving, shopping, community activity, and occupation  PERSONAL FACTORS: Age, Behavior pattern, Past/current experiences, Social background, Time since onset of injury/illness/exacerbation, Transportation, and 3+ comorbidities: see above  are also affecting patient's functional outcome.   REHAB POTENTIAL: Good  CLINICAL DECISION MAKING: Stable/uncomplicated  EVALUATION COMPLEXITY: Low  PLAN:  PT FREQUENCY: 2x/week  PT DURATION: 12 weeks  PLANNED INTERVENTIONS: 97164- PT Re-evaluation, 97110-Therapeutic exercises, 97530- Therapeutic activity, 97112- Neuromuscular re-education, 97535- Self Care, 16109- Manual therapy, [phone]- Gait training, [phone]- Prosthetic training, [phone]- Aquatic Therapy, Patient/Family education, Balance training, Stair training, Scar mobilization, Vestibular training, Visual/preceptual remediation/compensation, DME instructions, and  Wheelchair mobility training  PLAN FOR NEXT SESSION: Gait, balance, floor transfers  how is modified sink HEP?, balance recovery in standing/static stability w/ dec UE reliance, walking w/ 2WW, monitor vitals    Coreen Devoid, PT, DPT   06/11/2023, 1:39 PM

## 2023-06-15 ENCOUNTER — Encounter: Admitting: Occupational Therapy

## 2023-06-15 ENCOUNTER — Ambulatory Visit: Payer: 59

## 2023-06-15 VITALS — BP 133/80

## 2023-06-15 DIAGNOSIS — M6281 Muscle weakness (generalized): Secondary | ICD-10-CM | POA: Diagnosis not present

## 2023-06-15 DIAGNOSIS — R293 Abnormal posture: Secondary | ICD-10-CM

## 2023-06-15 DIAGNOSIS — R2689 Other abnormalities of gait and mobility: Secondary | ICD-10-CM

## 2023-06-15 NOTE — Therapy (Signed)
 OUTPATIENT PHYSICAL THERAPY NEURO TREATMENT   Patient Name: Kelli Hudson MRN: [id number] DOB:15-Oct-1990, 33 y.o., female Today's Date: 06/15/2023   PCP: Vevelyn Gowers, NP REFERRING PROVIDER: Gearldean Keepers, MD  END OF SESSION:  PT End of Session - 06/15/23 1321     Visit Number 17    Number of Visits 25    Date for PT Re-Evaluation 07/10/23    Authorization Type Medicare/Medicaid    Progress Note Due on Visit 20    PT Start Time 1319   patient late   PT Stop Time 1400    PT Time Calculation (min) 41 min    Equipment Utilized During Treatment Gait belt    Activity Tolerance Patient tolerated treatment well    Behavior During Therapy WFL for tasks assessed/performed             Past Medical History:  Diagnosis Date   Acute H. pylori gastric ulcer    Coffee ground emesis    Diabetes mellitus (HCC)    Diabetic gastroparesis (HCC)    DKA (diabetic ketoacidosis) (HCC) 02/24/2021   Gastroparesis    GERD (gastroesophageal reflux disease)    Hypertension    Hyperthyroidism    Intractable nausea and vomiting 04/20/2021   Normocytic anemia 04/20/2020   Prolonged Q-T interval on ECG    Past Surgical History:  Procedure Laterality Date   AMPUTATION Bilateral 10/24/2022   Procedure: BILATERAL BELOW KNEE AMPUTATION;  Surgeon: Timothy Ford, MD;  Location: [facility] OR;  Service: Orthopedics;  Laterality: Bilateral;   AMPUTATION Bilateral 10/08/2022   Procedure: BILATERAL LEG DEBRIDEMENT;  Surgeon: Timothy Ford, MD;  Location: [facility] OR;  Service: Orthopedics;  Laterality: Bilateral;   AMPUTATION Bilateral 11/14/2022   Procedure: BILATERAL ABOVE KNEE AMPUTATION;  Surgeon: Timothy Ford, MD;  Location: [facility] OR;  Service: Orthopedics;  Laterality: Bilateral;   AMPUTATION TOE Left 03/10/2018   Procedure: AMPUTATION FIFTH TOE;  Surgeon: Charity Conch, DPM;  Location: [facility];  Service: Podiatry;  Laterality: Left;   APPLICATION OF WOUND VAC Bilateral  10/12/2022   Procedure: APPLICATION OF WOUND VAC BILATERAL LEGS;  Surgeon: Hardy Lia, MD;  Location: [facility] OR;  Service: Orthopedics;  Laterality: Bilateral;   APPLICATION OF WOUND VAC Bilateral 10/24/2022   Procedure: APPLICATION OF WOUND VAC;  Surgeon: Timothy Ford, MD;  Location: [facility] OR;  Service: Orthopedics;  Laterality: Bilateral;   BIOPSY  01/28/2020   Procedure: BIOPSY;  Surgeon: Felecia Hopper, MD;  Location: [facility];  Service: Gastroenterology;;   BOTOX  INJECTION  08/26/2021   Procedure: BOTOX  INJECTION;  Surgeon: Albertina Hugger, MD;  Location: [facility];  Service: Gastroenterology;;   ESOPHAGOGASTRODUODENOSCOPY N/A 01/28/2020   Procedure: ESOPHAGOGASTRODUODENOSCOPY (EGD);  Surgeon: Felecia Hopper, MD;  Location: [facility];  Service: Gastroenterology;  Laterality: N/A;   ESOPHAGOGASTRODUODENOSCOPY (EGD) WITH PROPOFOL  Left 09/08/2015   Procedure: ESOPHAGOGASTRODUODENOSCOPY (EGD) WITH PROPOFOL ;  Surgeon: Evangeline Hilts, MD;  Location: [facility] ENDOSCOPY;  Service: Endoscopy;  Laterality: Left;   ESOPHAGOGASTRODUODENOSCOPY (EGD) WITH PROPOFOL  N/A 08/26/2021   Procedure: ESOPHAGOGASTRODUODENOSCOPY (EGD) WITH PROPOFOL ;  Surgeon: Albertina Hugger, MD;  Location: [facility];  Service: Gastroenterology;  Laterality: N/A;   GASTRIC STIMULATOR IMPLANT SURGERY  06/2022   at [facility]   I & D EXTREMITY Bilateral 10/12/2022   Procedure: IRRIGATION AND  DEBRIDEMENT  BILATERAL LEGS;  Surgeon: Hardy Lia, MD;  Location: [facility] OR;  Service: Orthopedics;  Laterality: Bilateral;   I & D EXTREMITY Right 10/13/2022   Procedure:  RIGHT THIGH WOUND VAC EXCHANGE;  Surgeon: Laneta Pintos, MD;  Location: [facility] OR;  Service: Orthopedics;  Laterality: Right;   I & D EXTREMITY Bilateral 10/17/2022   Procedure: BILATERAL THIGH AND LEG DEBRIDEMENT, PARTIAL BILATERAL FOOT AMPUTATION;  Surgeon: Timothy Ford, MD;  Location: [facility] OR;  Service: Orthopedics;  Laterality: Bilateral;   I & D EXTREMITY Bilateral  11/05/2022   Procedure: BILATERAL THIGH DEBRIDEMENT;  Surgeon: Timothy Ford, MD;  Location: [facility] OR;  Service: Orthopedics;  Laterality: Bilateral;   I & D EXTREMITY Bilateral 11/07/2022   Procedure: BILATERAL THIGH AND LEG DEBRIDEMENT;  Surgeon: Timothy Ford, MD;  Location: [facility] OR;  Service: Orthopedics;  Laterality: Bilateral;   I & D EXTREMITY Bilateral 11/12/2022   Procedure: BILATERAL LEG DEBRIDEMENTS;  Surgeon: Timothy Ford, MD;  Location: [facility] OR;  Service: Orthopedics;  Laterality: Bilateral;   IRRIGATION AND DEBRIDEMENT FOOT Bilateral 10/05/2022   Procedure: IRRIGATION AND DEBRIDEMENT FOOT;  Surgeon: Dot Gazella, DPM;  Location: [facility]S;  Service: Orthopedics/Podiatry;  Laterality: Bilateral;   WISDOM TOOTH EXTRACTION     Patient Active Problem List   Diagnosis Date Noted   Influenza A 04/05/2023   Elevated lipase 04/05/2023   Esophagitis 03/24/2023   SIRS (systemic inflammatory response syndrome) (HCC) 03/02/2023   Fever 03/02/2023   Metabolic acidosis 03/02/2023   Hypertensive urgency 02/21/2023   Chronic pain 02/21/2023   Abscess of thigh 11/28/2022   C. 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   Hypokalemia 11/27/2022   Effusion, left knee 11/05/2022   MDD (major depressive disorder), recurrent episode, moderate (HCC) 10/16/2022   Necrotizing fasciitis of pelvic region and thigh (HCC) 10/08/2022   Necrotizing fasciitis of lower leg (HCC) 10/08/2022   Necrotizing fasciitis (HCC) 10/08/2022   Sepsis due to cellulitis (HCC) 10/03/2022   Cellulitis of lower extremity 10/03/2022   Multinodular goiter 06/18/2022   Malnutrition of moderate degree 04/18/2022   Intractable vomiting with nausea 04/17/2022   DKA (diabetic ketoacidosis) (HCC) 03/04/2022   Nausea vomiting and diarrhea 12/25/2021   Diabetic gastroparesis (HCC) 10/09/2021   Drug-seeking behavior 09/21/2021   Essential hypertension 08/25/2021   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) 08/25/2021   GERD without esophagitis 08/25/2021   Hematemesis    Prolonged Q-T interval on ECG    Nausea and vomiting 07/20/2021   Anemia of chronic disease 06/06/2021   Dehydration 06/04/2021   Hypomagnesemia 04/21/2021   Ulcerated, foot, left, with fat layer exposed (HCC) 04/20/2021   Uncontrolled type 1 diabetes mellitus with hyperglycemia, with long-term current use of insulin  (HCC) 12/18/2020   DM type 1 (diabetes mellitus, type 1) (HCC) 12/18/2020   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) 11/09/2020   Diabetic retinopathy of both eyes associated with type 1 diabetes mellitus (HCC) 09/24/2020   Diabetic gastroparesis associated with type 1 diabetes mellitus (HCC) 08/12/2020   Acute kidney injury superimposed on chronic kidney disease (HCC) 07/25/2020   Generalized abdominal pain 07/23/2020   GERD (gastroesophageal reflux disease) 07/23/2020   Diabetic nephropathy associated with type 1 diabetes mellitus (HCC) 06/27/2020   Sepsis (HCC) 05/27/2020   HLD (hyperlipidemia) 05/23/2020   Sinus tachycardia 05/09/2020   Anemia 04/20/2020   Depression with anxiety 07/05/2019   Diabetic polyneuropathy associated with type 1 diabetes mellitus (HCC) 09/24/2017   Gastroparesis    Goiter 10/05/2009   DM2 (diabetes mellitus, type 2) (HCC) 10/05/2009   Hypertension associated  with diabetes (HCC) 10/05/2009    ONSET DATE: 04/07/23 referral  REFERRING DIAG: [phone] (ICD-10-CM) - S/P AKA (above knee amputation) bilateral (HCC)   THERAPY DIAG:  Muscle weakness (generalized)  Abnormal posture  Other abnormalities of gait and mobility  Rationale for Evaluation and Treatment: Rehabilitation  SUBJECTIVE:                                                                                                                                                                                             SUBJECTIVE STATEMENT: Patient  reports doing well. Took her BP meds this AM. Denies falls.   Pt accompanied by: family member - mother Audrene Blessing  PERTINENT HISTORY: DM, diabetic gastroparesis, GERD, HTN, hyperthyroidism  PAIN:  Are you having pain? No  VITALS: Vitals:   06/15/23 1434  BP: 133/80    PRECAUTIONS: Fall; B diabetic retinopathy  PATIENT GOALS: "to be able to walk again"                                                                                                                              TREATMENT Prosthetic training: -67ft, 19ft, 52ft RW + CGA/MinA -tearful re: progress and PT provided appropriate emotional support and feedback re: progress  PATIENT EDUCATION: Education details: Continue HEP Person educated: Patient and friend Education method: Medical illustrator Education comprehension: verbalized understanding and needs further education  HOME EXERCISE PROGRAM: -practice donning/doffing prosthetic Access Code: [email or web address] URL: [email or web address] Date: 04/20/2023 Prepared by: Nickola Baron  Exercises - Prone Hip Extension with Residual Limb (BKA)  - 1 x daily - 7 x weekly - 3 sets - 10 reps - Sidelying Hip Abduction (AKA)  - 1 x daily - 7 x weekly - 3 sets - 10 reps - Supine Piriformis Stretch with Residual Limb (AKA)  - 1 x daily - 7 x weekly - 3 sets - 10 reps  Do each exercise 1-2  times per day Do each exercise 10 repetitions Hold each exercise for 2 seconds to feel your location  AT SINK FIND YOUR MIDLINE POSITION AND PLACE FEET  EQUAL DISTANCE FROM THE MIDLINE.  Try to find this position when standing still for activities.   USE TAPE ON FLOOR TO MARK THE MIDLINE POSITION. You also should try to feel with your limb pressure in socket.  You are trying to feel with limb what you used to feel with the bottom of your foot.  Side to Side Shift: Moving your hips only (not shoulders): move weight onto your left leg, HOLD/FEEL.  Move back to equal weight on  each leg, HOLD/FEEL. Move weight onto your right leg, HOLD/FEEL. Move back to equal weight on each leg, HOLD/FEEL. Repeat.  Start with both hands on sink, progress to right hand only, then no hands.  Front to Back Shift: Moving your hips only (not shoulders): move your weight forward onto your toes, HOLD/FEEL. Move your weight back to equal Flat Foot on both legs, HOLD/FEEL. Move your weight back onto your heels, HOLD/FEEL. Move your weight back to equal on both legs, HOLD/FEEL. Repeat.   tart with both hands on sink, progress to right hand only, then no hands. Moving Cones / Cups: With equal weight on each leg: Hold on with one hand the first time, then progress to no hand supports. Move cups from one side of sink to the other. Place cups ~2" out of your reach, progress to 10" beyond reach.  Place one hand in middle of sink and reach with other hand. Do both arms.  Then hover one hand and move cups with other hand. -just use a crossbody reach to end of safe weight shift ROM Overhead/Upward Reaching: alternated reaching up to top cabinets or ceiling if no cabinets present. Keep equal weight on each leg. Start with one hand support on counter while other hand reaches and progress to no hand support with reaching.  ace one hand in middle of sink and reach with other hand. Do both arms.  Then hover one hand and move cups with other hand.  5.   Looking Over Shoulders: With equal weight on each leg: alternate turning to look over your shoulders with one hand support on counter as needed.  Start with head motions only to look in front of shoulder, then even with shoulder and progress to looking behind you. To look to side, move head /eyes, then shoulder on side looking pulls back, shift more weight to side looking and pull hip back. ace one hand in middle of sink and let go with other hand so your shoulder can pull back. Switch hands to look other way.   Then hover one hand and move cups with other hand.  6.   Stepping with leg that is not amputated:  Move items under cabinet out of your way. Shift your hips/pelvis so weight on prosthesis. SLOWLY step other leg so front of foot is in cabinet. Then step back to floor. - just hike the hip and place the foot back in the starting spot  GOALS: Goals reviewed with patient? Yes  SHORT TERM GOALS: Target date: 05/15/23  Pt will be independent with initial HEP for improved gait mechanics and balance  Baseline: to be updated, reports she has not been doing at home doesn't trust CNA Goal status: NOT MET  2.  Patient will ambulate at least 42ft with LRAD and no more than MinA to demonstrate improved mobility  Baseline: 75ft // bars + MinA, 10 feet with 2WW + minA Goal status: NOT met  3.  Patient will complete sit <> stand with LRAD  and no more than CGA for improved independence with transfers Baseline: MinA, required minA in today's session  Goal status: NOT met - required minA due to posterior instability  4.  Patient will complete floor transfer with assist as initial step of fall recovery  Baseline: unable to complete on eval; MinA Goal status: MET   LONG TERM GOALS: Target date: 07/10/23  Pt will be independent with final HEP for improved gait mechanics and balance  Baseline: to be updated Goal status: INITIAL  Patient will ambulate at least 54ft with LRAD, ModI to demonstrate improved mobility  Baseline: 38ft // bars + MinA Goal status: INITIAL  Patient will complete sit <> stand with LRAD. SBA for improved independence with transfers Baseline: MinA Goal status: REVISED  Patient will complete floor transfer without assist as initial step of fall recovery  Baseline: unable to complete on eval Goal status: INITIAL  5. Patient will don/doff prosthetics independently   Baseline: MaxA  Goal status: INITIAL  ASSESSMENT:  CLINICAL IMPRESSION: Patient seen for skilled PT session with emphasis on prosthetic training. She is able to ambulate  further distance with slight turns and reduced assistance. She does require intermittent multimodal cues for R lateral weight shift to improve L foot clearance. Appears to prefer a step- to pattern with R LE, possibly due to difficulty/frustration advancing L LE. PT questioning L LE relative leg-length discrepancy either due to ill fitting prosthetic or prosthetic slightly too long. Continue POC.   OBJECTIVE IMPAIRMENTS: Abnormal gait, decreased activity tolerance, decreased balance, decreased endurance, decreased knowledge of condition, decreased knowledge of use of DME, decreased strength, impaired vision/preception, and prosthetic dependency .   ACTIVITY LIMITATIONS: carrying, lifting, standing, squatting, transfers, hygiene/grooming, locomotion level, and caring for others  PARTICIPATION LIMITATIONS: meal prep, cleaning, interpersonal relationship, driving, shopping, community activity, and occupation  PERSONAL FACTORS: Age, Behavior pattern, Past/current experiences, Social background, Time since onset of injury/illness/exacerbation, Transportation, and 3+ comorbidities: see above  are also affecting patient's functional outcome.   REHAB POTENTIAL: Good  CLINICAL DECISION MAKING: Stable/uncomplicated  EVALUATION COMPLEXITY: Low  PLAN:  PT FREQUENCY: 2x/week  PT DURATION: 12 weeks  PLANNED INTERVENTIONS: 97164- PT Re-evaluation, 97110-Therapeutic exercises, 97530- Therapeutic activity, 97112- Neuromuscular re-education, 97535- Self Care, 16109- Manual therapy, [phone]- Gait training, [phone]- Prosthetic training, [phone]- Aquatic Therapy, Patient/Family education, Balance training, Stair training, Scar mobilization, Vestibular training, Visual/preceptual remediation/compensation, DME instructions, and Wheelchair mobility training  PLAN FOR NEXT SESSION: Gait, balance, floor transfers  how is modified sink HEP?, balance recovery in standing/static stability w/ dec UE reliance, walking w/ 2WW,  monitor vitals    Rebecca Campus, PT Rebecca Campus, PT, DPT, CBIS  06/15/2023, 4:03 PM

## 2023-06-18 ENCOUNTER — Encounter: Admitting: Occupational Therapy

## 2023-06-18 ENCOUNTER — Ambulatory Visit: Payer: 59 | Attending: Orthopedic Surgery | Admitting: Physical Therapy

## 2023-06-18 ENCOUNTER — Encounter: Payer: Self-pay | Admitting: Physical Therapy

## 2023-06-18 VITALS — BP 125/76 | HR 98

## 2023-06-18 DIAGNOSIS — R293 Abnormal posture: Secondary | ICD-10-CM | POA: Diagnosis present

## 2023-06-18 DIAGNOSIS — R29898 Other symptoms and signs involving the musculoskeletal system: Secondary | ICD-10-CM | POA: Insufficient documentation

## 2023-06-18 DIAGNOSIS — M6281 Muscle weakness (generalized): Secondary | ICD-10-CM | POA: Insufficient documentation

## 2023-06-18 DIAGNOSIS — R2689 Other abnormalities of gait and mobility: Secondary | ICD-10-CM | POA: Diagnosis present

## 2023-06-18 NOTE — Therapy (Signed)
 OUTPATIENT PHYSICAL THERAPY NEURO TREATMENT   Patient Name: Kelli Hudson MRN: [id number] DOB:[birth year], 33 y.o., female Today's Date: 06/18/2023   PCP: Vevelyn Gowers, NP REFERRING PROVIDER: Gearldean Keepers, MD  END OF SESSION:  PT End of Session - 06/18/23 1316     Visit Number 18    Number of Visits 25    Date for PT Re-Evaluation 07/10/23    Authorization Type Medicare/Medicaid    Progress Note Due on Visit 20    PT Start Time 1315    PT Stop Time 1338    PT Time Calculation (min) 23 min    Equipment Utilized During Treatment Gait belt    Activity Tolerance Patient tolerated treatment well    Behavior During Therapy WFL for tasks assessed/performed             Past Medical History:  Diagnosis Date   Acute H. pylori gastric ulcer    Coffee ground emesis    Diabetes mellitus (HCC)    Diabetic gastroparesis (HCC)    DKA (diabetic ketoacidosis) (HCC) 02/24/2021   Gastroparesis    GERD (gastroesophageal reflux disease)    Hypertension    Hyperthyroidism    Intractable nausea and vomiting 04/20/2021   Normocytic anemia 04/20/2020   Prolonged Q-T interval on ECG    Past Surgical History:  Procedure Laterality Date   AMPUTATION Bilateral 10/24/2022   Procedure: BILATERAL BELOW KNEE AMPUTATION;  Surgeon: Timothy Ford, MD;  Location: [facility] OR;  Service: Orthopedics;  Laterality: Bilateral;   AMPUTATION Bilateral 10/08/2022   Procedure: BILATERAL LEG DEBRIDEMENT;  Surgeon: Timothy Ford, MD;  Location: [facility] OR;  Service: Orthopedics;  Laterality: Bilateral;   AMPUTATION Bilateral 11/14/2022   Procedure: BILATERAL ABOVE KNEE AMPUTATION;  Surgeon: Timothy Ford, MD;  Location: [facility] OR;  Service: Orthopedics;  Laterality: Bilateral;   AMPUTATION TOE Left 03/10/2018   Procedure: AMPUTATION FIFTH TOE;  Surgeon: Charity Conch, DPM;  Location:  [facility];  Service: Podiatry;  Laterality: Left;   APPLICATION OF WOUND VAC Bilateral 10/12/2022    Procedure: APPLICATION OF WOUND VAC BILATERAL LEGS;  Surgeon: Hardy Lia, MD;  Location: [facility] OR;  Service: Orthopedics;  Laterality: Bilateral;   APPLICATION OF WOUND VAC Bilateral 10/24/2022   Procedure: APPLICATION OF WOUND VAC;  Surgeon: Timothy Ford, MD;  Location: [facility] OR;  Service: Orthopedics;  Laterality: Bilateral;   BIOPSY  01/28/2020   Procedure: BIOPSY;  Surgeon: Felecia Hopper, MD;  Location: [facility];  Service: Gastroenterology;;   BOTOX  INJECTION  08/26/2021   Procedure: BOTOX  INJECTION;  Surgeon: Albertina Hugger, MD;  Location: [facility];  Service: Gastroenterology;;   ESOPHAGOGASTRODUODENOSCOPY N/A 01/28/2020   Procedure: ESOPHAGOGASTRODUODENOSCOPY (EGD);  Surgeon: Felecia Hopper, MD;  Location: [facility];  Service: Gastroenterology;  Laterality: N/A;   ESOPHAGOGASTRODUODENOSCOPY (EGD) WITH PROPOFOL  Left 09/08/2015   Procedure: ESOPHAGOGASTRODUODENOSCOPY (EGD) WITH PROPOFOL ;  Surgeon: Evangeline Hilts, MD;  Location: [facility] ENDOSCOPY;  Service: Endoscopy;  Laterality: Left;   ESOPHAGOGASTRODUODENOSCOPY (EGD) WITH PROPOFOL  N/A 08/26/2021   Procedure: ESOPHAGOGASTRODUODENOSCOPY (EGD) WITH PROPOFOL ;  Surgeon: Albertina Hugger, MD;  Location: [facility];  Service: Gastroenterology;  Laterality: N/A;   GASTRIC STIMULATOR IMPLANT SURGERY  06/2022   at [facility]   I & D EXTREMITY Bilateral 10/12/2022   Procedure: IRRIGATION AND  DEBRIDEMENT  BILATERAL LEGS;  Surgeon: Hardy Lia, MD;  Location: [facility] OR;  Service: Orthopedics;  Laterality: Bilateral;   I & D EXTREMITY Right 10/13/2022   Procedure: RIGHT THIGH WOUND  VAC EXCHANGE;  Surgeon: Laneta Pintos, MD;  Location: [facility] OR;  Service: Orthopedics;  Laterality: Right;   I & D EXTREMITY Bilateral 10/17/2022   Procedure: BILATERAL THIGH AND LEG DEBRIDEMENT, PARTIAL BILATERAL FOOT AMPUTATION;  Surgeon: Timothy Ford, MD;  Location: [facility] OR;  Service: Orthopedics;  Laterality: Bilateral;   I & D EXTREMITY Bilateral 11/05/2022    Procedure: BILATERAL THIGH DEBRIDEMENT;  Surgeon: Timothy Ford, MD;  Location: [facility] OR;  Service: Orthopedics;  Laterality: Bilateral;   I & D EXTREMITY Bilateral 11/07/2022   Procedure: BILATERAL THIGH AND LEG DEBRIDEMENT;  Surgeon: Timothy Ford, MD;  Location: [facility] OR;  Service: Orthopedics;  Laterality: Bilateral;   I & D EXTREMITY Bilateral 11/12/2022   Procedure: BILATERAL LEG DEBRIDEMENTS;  Surgeon: Timothy Ford, MD;  Location: [facility] OR;  Service: Orthopedics;  Laterality: Bilateral;   IRRIGATION AND DEBRIDEMENT FOOT Bilateral 10/05/2022   Procedure: IRRIGATION AND DEBRIDEMENT FOOT;  Surgeon: Dot Gazella, DPM;  Location: [facility];  Service: Orthopedics/Podiatry;  Laterality: Bilateral;   WISDOM TOOTH EXTRACTION     Patient Active Problem List   Diagnosis Date Noted   Influenza A 04/05/2023   Elevated lipase 04/05/2023   Esophagitis 03/24/2023   SIRS (systemic inflammatory response syndrome) (HCC) 03/02/2023   Fever 03/02/2023   Metabolic acidosis 03/02/2023   Hypertensive urgency 02/21/2023   Chronic pain 02/21/2023   Abscess of thigh 11/28/2022   C. 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   Hypokalemia 11/27/2022   Effusion, left knee 11/05/2022   MDD (major depressive disorder), recurrent episode, moderate (HCC) 10/16/2022   Necrotizing fasciitis of pelvic region and thigh (HCC) 10/08/2022   Necrotizing fasciitis of lower leg (HCC) 10/08/2022   Necrotizing fasciitis (HCC) 10/08/2022   Sepsis due to cellulitis (HCC) 10/03/2022   Cellulitis of lower extremity 10/03/2022   Multinodular goiter 06/18/2022   Malnutrition of moderate degree 04/18/2022   Intractable vomiting with nausea 04/17/2022   DKA (diabetic ketoacidosis) (HCC) 03/04/2022   Nausea vomiting and diarrhea 12/25/2021   Diabetic gastroparesis (HCC) 10/09/2021   Drug-seeking behavior 09/21/2021   Essential hypertension 08/25/2021   Diabetes mellitus type 1 with  complications (HCC) 08/25/2021   GERD without esophagitis 08/25/2021   Hematemesis    Prolonged Q-T interval on ECG    Nausea and vomiting 07/20/2021   Anemia of chronic disease 06/06/2021   Dehydration 06/04/2021   Hypomagnesemia 04/21/2021   Ulcerated, foot, left, with fat layer exposed (HCC) 04/20/2021   Uncontrolled type 1 diabetes mellitus with hyperglycemia, with long-term current use of insulin  (HCC) 12/18/2020   DM type 1 (diabetes mellitus, type 1) (HCC) 12/18/2020   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) 11/09/2020   Diabetic retinopathy of both eyes associated with type 1 diabetes mellitus (HCC) 09/24/2020   Diabetic gastroparesis associated with type 1 diabetes mellitus (HCC) 08/12/2020   Acute kidney injury superimposed on chronic kidney disease (HCC) 07/25/2020   Generalized abdominal pain 07/23/2020   GERD (gastroesophageal reflux disease) 07/23/2020   Diabetic nephropathy associated with type 1 diabetes mellitus (HCC) 06/27/2020   Sepsis (HCC) 05/27/2020   HLD (hyperlipidemia) 05/23/2020   Sinus tachycardia 05/09/2020   Anemia 04/20/2020   Depression with anxiety 07/05/2019   Diabetic polyneuropathy associated with type 1 diabetes mellitus (HCC) 09/24/2017   Gastroparesis    Goiter 10/05/2009   DM2 (diabetes mellitus, type 2) (HCC) 10/05/2009   Hypertension associated with diabetes (HCC)  10/05/2009    ONSET DATE: 04/07/23 referral  REFERRING DIAG: [phone] (ICD-10-CM) - S/P AKA (above knee amputation) bilateral (HCC)   THERAPY DIAG:  Muscle weakness (generalized)  Abnormal posture  Other abnormalities of gait and mobility  Rationale for Evaluation and Treatment: Rehabilitation  SUBJECTIVE:                                                                                                                                                                                             SUBJECTIVE STATEMENT: Patient reports no major  changes. Does report blood sugars were running a little high today. She denies falls and near falls. Otherwise doing well.   Pt accompanied by: friend - Drew  PERTINENT HISTORY: DM, diabetic gastroparesis, GERD, HTN, hyperthyroidism  PAIN:  Are you having pain? No  VITALS: Vitals:   06/18/23 1320  BP: 125/76  Pulse: 98     PRECAUTIONS: Fall; B diabetic retinopathy  PATIENT GOALS: "to be able to walk again"                                                                                                                              TREATMENT  Vitals:   06/18/23 1320  BP: 125/76  Pulse: 98  Seated on RUE, WFL for therapy  Self Care: Patient Dexcom originally not turned on and in the car, given reports of high readings, PT requested to check, Devin went and got phone with patient permission, once on readings over 400, PT encouraged patient to call PCP and if did not come down to go in, discontinued further session given degree of elevated reading   Prosthetic training: Patient with increased challenge donning prosthetic today with increased swelling in LE, worked on donning with PT while waited for Dexcom to turn on to assess sugars, required PT education on removing one 3 ply sock on R to get leg on, friend cued on alignment for better positioning   PATIENT EDUCATION: Education details: Continue HEP Person educated: Patient and friend Education method: Medical illustrator Education comprehension: verbalized understanding and needs further  education  HOME EXERCISE PROGRAM: -practice donning/doffing prosthetic Access Code: [email or web address] URL: [email or web address] Date: 04/20/2023 Prepared by: Nickola Baron  Exercises - Prone Hip Extension with Residual Limb (BKA)  - 1 x daily - 7 x weekly - 3 sets - 10 reps - Sidelying Hip Abduction (AKA)  - 1 x daily - 7 x weekly - 3 sets - 10 reps - Supine Piriformis Stretch with Residual Limb (AKA)  - 1 x daily - 7  x weekly - 3 sets - 10 reps  Do each exercise 1-2  times per day Do each exercise 10 repetitions Hold each exercise for 2 seconds to feel your location  AT SINK FIND YOUR MIDLINE POSITION AND PLACE FEET EQUAL DISTANCE FROM THE MIDLINE.  Try to find this position when standing still for activities.   USE TAPE ON FLOOR TO MARK THE MIDLINE POSITION. You also should try to feel with your limb pressure in socket.  You are trying to feel with limb what you used to feel with the bottom of your foot.  Side to Side Shift: Moving your hips only (not shoulders): move weight onto your left leg, HOLD/FEEL.  Move back to equal weight on each leg, HOLD/FEEL. Move weight onto your right leg, HOLD/FEEL. Move back to equal weight on each leg, HOLD/FEEL. Repeat.  Start with both hands on sink, progress to right hand only, then no hands.  Front to Back Shift: Moving your hips only (not shoulders): move your weight forward onto your toes, HOLD/FEEL. Move your weight back to equal Flat Foot on both legs, HOLD/FEEL. Move your weight back onto your heels, HOLD/FEEL. Move your weight back to equal on both legs, HOLD/FEEL. Repeat.   tart with both hands on sink, progress to right hand only, then no hands. Moving Cones / Cups: With equal weight on each leg: Hold on with one hand the first time, then progress to no hand supports. Move cups from one side of sink to the other. Place cups ~2" out of your reach, progress to 10" beyond reach.  Place one hand in middle of sink and reach with other hand. Do both arms.  Then hover one hand and move cups with other hand. -just use a crossbody reach to end of safe weight shift ROM Overhead/Upward Reaching: alternated reaching up to top cabinets or ceiling if no cabinets present. Keep equal weight on each leg. Start with one hand support on counter while other hand reaches and progress to no hand support with reaching.  ace one hand in middle of sink and reach with other hand. Do both arms.   Then hover one hand and move cups with other hand.  5.   Looking Over Shoulders: With equal weight on each leg: alternate turning to look over your shoulders with one hand support on counter as needed.  Start with head motions only to look in front of shoulder, then even with shoulder and progress to looking behind you. To look to side, move head /eyes, then shoulder on side looking pulls back, shift more weight to side looking and pull hip back. ace one hand in middle of sink and let go with other hand so your shoulder can pull back. Switch hands to look other way.   Then hover one hand and move cups with other hand.  6.  Stepping with leg that is not amputated:  Move items under cabinet out of your way. Shift your hips/pelvis so weight on prosthesis. SLOWLY step other leg so  front of foot is in cabinet. Then step back to floor. - just hike the hip and place the foot back in the starting spot  GOALS: Goals reviewed with patient? Yes  SHORT TERM GOALS: Target date: 05/15/23  Pt will be independent with initial HEP for improved gait mechanics and balance  Baseline: to be updated, reports she has not been doing at home doesn't trust CNA Goal status: NOT MET  2.  Patient will ambulate at least 68ft with LRAD and no more than MinA to demonstrate improved mobility  Baseline: 8ft // bars + MinA, 10 feet with 2WW + minA Goal status: NOT met  3.  Patient will complete sit <> stand with LRAD and no more than CGA for improved independence with transfers Baseline: MinA, required minA in today's session  Goal status: NOT met - required minA due to posterior instability  4.  Patient will complete floor transfer with assist as initial step of fall recovery  Baseline: unable to complete on eval; MinA Goal status: MET   LONG TERM GOALS: Target date: 07/10/23  Pt will be independent with final HEP for improved gait mechanics and balance  Baseline: to be updated Goal status: INITIAL  Patient will  ambulate at least 46ft with LRAD, ModI to demonstrate improved mobility  Baseline: 14ft // bars + MinA Goal status: INITIAL  Patient will complete sit <> stand with LRAD. SBA for improved independence with transfers Baseline: MinA Goal status: REVISED  Patient will complete floor transfer without assist as initial step of fall recovery  Baseline: unable to complete on eval Goal status: INITIAL  5. Patient will don/doff prosthetics independently   Baseline: MaxA  Goal status: INITIAL  ASSESSMENT:  CLINICAL IMPRESSION: Skilled PT reading limited as patient blood glucose reading over 400. Patient reports elevated readings throughout the day and drinking slushi. Pt educates on safe readings in PT and concern given degree of elevation. Recommended medical follow up. Continue POC.   OBJECTIVE IMPAIRMENTS: Abnormal gait, decreased activity tolerance, decreased balance, decreased endurance, decreased knowledge of condition, decreased knowledge of use of DME, decreased strength, impaired vision/preception, and prosthetic dependency .   ACTIVITY LIMITATIONS: carrying, lifting, standing, squatting, transfers, hygiene/grooming, locomotion level, and caring for others  PARTICIPATION LIMITATIONS: meal prep, cleaning, interpersonal relationship, driving, shopping, community activity, and occupation  PERSONAL FACTORS: Age, Behavior pattern, Past/current experiences, Social background, Time since onset of injury/illness/exacerbation, Transportation, and 3+ comorbidities: see above  are also affecting patient's functional outcome.   REHAB POTENTIAL: Good  CLINICAL DECISION MAKING: Stable/uncomplicated  EVALUATION COMPLEXITY: Low  PLAN:  PT FREQUENCY: 2x/week  PT DURATION: 12 weeks  PLANNED INTERVENTIONS: 97164- PT Re-evaluation, 97110-Therapeutic exercises, 97530- Therapeutic activity, 97112- Neuromuscular re-education, 97535- Self Care, [id number]- Manual therapy, [phone]- Gait training, [phone]-  Prosthetic training, [phone]- Aquatic Therapy, Patient/Family education, Balance training, Stair training, Scar mobilization, Vestibular training, Visual/preceptual remediation/compensation, DME instructions, and Wheelchair mobility training  PLAN FOR NEXT SESSION: Gait, balance, floor transfers  how is modified sink HEP?, balance recovery in standing/static stability w/ dec UE reliance, walking w/ 2WW, monitor vitals   Sugar update? Needs to be better medically managed to get through sessions    Coreen Devoid, PT, DPT  06/18/2023, 1:44 PM

## 2023-06-22 ENCOUNTER — Ambulatory Visit: Payer: 59

## 2023-06-22 ENCOUNTER — Encounter: Admitting: Occupational Therapy

## 2023-06-22 DIAGNOSIS — M6281 Muscle weakness (generalized): Secondary | ICD-10-CM | POA: Diagnosis not present

## 2023-06-22 DIAGNOSIS — R2689 Other abnormalities of gait and mobility: Secondary | ICD-10-CM

## 2023-06-22 DIAGNOSIS — R293 Abnormal posture: Secondary | ICD-10-CM

## 2023-06-22 NOTE — Therapy (Signed)
 OUTPATIENT PHYSICAL THERAPY NEURO TREATMENT   Patient Name: Kelli Hudson MRN: [id number] DOB:23-Dec-1990, 33 y.o., female Today's Date: 06/22/2023   PCP: Vevelyn Gowers, NP REFERRING PROVIDER: Gearldean Keepers, MD  END OF SESSION:  PT End of Session - 06/22/23 1310     Visit Number 19    Number of Visits 25    Date for PT Re-Evaluation 07/10/23    Authorization Type Medicare/Medicaid    Progress Note Due on Visit 20    PT Start Time 1315    PT Stop Time 1400    PT Time Calculation (min) 45 min    Equipment Utilized During Treatment Gait belt    Activity Tolerance Patient tolerated treatment well    Behavior During Therapy WFL for tasks assessed/performed             Past Medical History:  Diagnosis Date   Acute H. pylori gastric ulcer    Coffee ground emesis    Diabetes mellitus (HCC)    Diabetic gastroparesis (HCC)    DKA (diabetic ketoacidosis) (HCC) 02/24/2021   Gastroparesis    GERD (gastroesophageal reflux disease)    Hypertension    Hyperthyroidism    Intractable nausea and vomiting 04/20/2021   Normocytic anemia 04/20/2020   Prolonged Q-T interval on ECG    Past Surgical History:  Procedure Laterality Date   AMPUTATION Bilateral 10/24/2022   Procedure: BILATERAL BELOW KNEE AMPUTATION;  Surgeon: Timothy Ford, MD;  Location: [facility] OR;  Service: Orthopedics;  Laterality: Bilateral;   AMPUTATION Bilateral 10/08/2022   Procedure: BILATERAL LEG DEBRIDEMENT;  Surgeon: Timothy Ford, MD;  Location: [facility] OR;  Service: Orthopedics;  Laterality: Bilateral;   AMPUTATION Bilateral 11/14/2022   Procedure: BILATERAL ABOVE KNEE AMPUTATION;  Surgeon: Timothy Ford, MD;  Location: [facility] OR;  Service: Orthopedics;  Laterality: Bilateral;   AMPUTATION TOE Left 03/10/2018   Procedure: AMPUTATION FIFTH TOE;  Surgeon: Charity Conch, DPM;  Location: [facility];  Service: Podiatry;  Laterality: Left;   APPLICATION OF WOUND VAC Bilateral 10/12/2022    Procedure: APPLICATION OF WOUND VAC BILATERAL LEGS;  Surgeon: Hardy Lia, MD;  Location: [facility] OR;  Service: Orthopedics;  Laterality: Bilateral;   APPLICATION OF WOUND VAC Bilateral 10/24/2022   Procedure: APPLICATION OF WOUND VAC;  Surgeon: Timothy Ford, MD;  Location: [facility] OR;  Service: Orthopedics;  Laterality: Bilateral;   BIOPSY  01/28/2020   Procedure: BIOPSY;  Surgeon: Felecia Hopper, MD;  Location: [facility];  Service: Gastroenterology;;   BOTOX  INJECTION  08/26/2021   Procedure: BOTOX  INJECTION;  Surgeon: Albertina Hugger, MD;  Location: [facility];  Service: Gastroenterology;;   ESOPHAGOGASTRODUODENOSCOPY N/A 01/28/2020   Procedure: ESOPHAGOGASTRODUODENOSCOPY (EGD);  Surgeon: Felecia Hopper, MD;  Location: [facility];  Service: Gastroenterology;  Laterality: N/A;   ESOPHAGOGASTRODUODENOSCOPY (EGD) WITH PROPOFOL  Left 09/08/2015   Procedure: ESOPHAGOGASTRODUODENOSCOPY (EGD) WITH PROPOFOL ;  Surgeon: Evangeline Hilts, MD;  Location: [facility] ENDOSCOPY;  Service: Endoscopy;  Laterality: Left;   ESOPHAGOGASTRODUODENOSCOPY (EGD) WITH PROPOFOL  N/A 08/26/2021   Procedure: ESOPHAGOGASTRODUODENOSCOPY (EGD) WITH PROPOFOL ;  Surgeon: Albertina Hugger, MD;  Location: [facility];  Service: Gastroenterology;  Laterality: N/A;   GASTRIC STIMULATOR IMPLANT SURGERY  06/2022   at [facility]   I & D EXTREMITY Bilateral 10/12/2022   Procedure: IRRIGATION AND  DEBRIDEMENT  BILATERAL LEGS;  Surgeon: Hardy Lia, MD;  Location: [facility] OR;  Service: Orthopedics;  Laterality: Bilateral;   I & D EXTREMITY Right 10/13/2022   Procedure: RIGHT THIGH WOUND  VAC EXCHANGE;  Surgeon: Laneta Pintos, MD;  Location: [facility] OR;  Service: Orthopedics;  Laterality: Right;   I & D EXTREMITY Bilateral 10/17/2022   Procedure: BILATERAL THIGH AND LEG DEBRIDEMENT, PARTIAL BILATERAL FOOT AMPUTATION;  Surgeon: Timothy Ford, MD;  Location: [facility] OR;  Service: Orthopedics;  Laterality: Bilateral;   I & D EXTREMITY Bilateral 11/05/2022    Procedure: BILATERAL THIGH DEBRIDEMENT;  Surgeon: Timothy Ford, MD;  Location: [facility] OR;  Service: Orthopedics;  Laterality: Bilateral;   I & D EXTREMITY Bilateral 11/07/2022   Procedure: BILATERAL THIGH AND LEG DEBRIDEMENT;  Surgeon: Timothy Ford, MD;  Location: [facility] OR;  Service: Orthopedics;  Laterality: Bilateral;   I & D EXTREMITY Bilateral 11/12/2022   Procedure: BILATERAL LEG DEBRIDEMENTS;  Surgeon: Timothy Ford, MD;  Location: [facility] OR;  Service: Orthopedics;  Laterality: Bilateral;   IRRIGATION AND DEBRIDEMENT FOOT Bilateral 10/05/2022   Procedure: IRRIGATION AND DEBRIDEMENT FOOT;  Surgeon: Dot Gazella, DPM;  Location: [facility];  Service: Orthopedics/Podiatry;  Laterality: Bilateral;   WISDOM TOOTH EXTRACTION     Patient Active Problem List   Diagnosis Date Noted   Influenza A 04/05/2023   Elevated lipase 04/05/2023   Esophagitis 03/24/2023   SIRS (systemic inflammatory response syndrome) (HCC) 03/02/2023   Fever 03/02/2023   Metabolic acidosis 03/02/2023   Hypertensive urgency 02/21/2023   Chronic pain 02/21/2023   Abscess of thigh 11/28/2022   C. 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   Hypokalemia 11/27/2022   Effusion, left knee 11/05/2022   MDD (major depressive disorder), recurrent episode, moderate (HCC) 10/16/2022   Necrotizing fasciitis of pelvic region and thigh (HCC) 10/08/2022   Necrotizing fasciitis of lower leg (HCC) 10/08/2022   Necrotizing fasciitis (HCC) 10/08/2022   Sepsis due to cellulitis (HCC) 10/03/2022   Cellulitis of lower extremity 10/03/2022   Multinodular goiter 06/18/2022   Malnutrition of moderate degree 04/18/2022   Intractable vomiting with nausea 04/17/2022   DKA (diabetic ketoacidosis) (HCC) 03/04/2022   Nausea vomiting and diarrhea 12/25/2021   Diabetic gastroparesis (HCC) 10/09/2021   Drug-seeking behavior 09/21/2021   Essential hypertension 08/25/2021   Diabetes mellitus type 1 with  complications (HCC) 08/25/2021   GERD without esophagitis 08/25/2021   Hematemesis    Prolonged Q-T interval on ECG    Nausea and vomiting 07/20/2021   Anemia of chronic disease 06/06/2021   Dehydration 06/04/2021   Hypomagnesemia 04/21/2021   Ulcerated, foot, left, with fat layer exposed (HCC) 04/20/2021   Uncontrolled type 1 diabetes mellitus with hyperglycemia, with long-term current use of insulin  (HCC) 12/18/2020   DM type 1 (diabetes mellitus, type 1) (HCC) 12/18/2020   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) 11/09/2020   Diabetic retinopathy of both eyes associated with type 1 diabetes mellitus (HCC) 09/24/2020   Diabetic gastroparesis associated with type 1 diabetes mellitus (HCC) 08/12/2020   Acute kidney injury superimposed on chronic kidney disease (HCC) 07/25/2020   Generalized abdominal pain 07/23/2020   GERD (gastroesophageal reflux disease) 07/23/2020   Diabetic nephropathy associated with type 1 diabetes mellitus (HCC) 06/27/2020   Sepsis (HCC) 05/27/2020   HLD (hyperlipidemia) 05/23/2020   Sinus tachycardia 05/09/2020   Anemia 04/20/2020   Depression with anxiety 07/05/2019   Diabetic polyneuropathy associated with type 1 diabetes mellitus (HCC) 09/24/2017   Gastroparesis    Goiter 10/05/2009   DM2 (diabetes mellitus, type 2) (HCC) 10/05/2009   Hypertension associated with diabetes (HCC)  10/05/2009    ONSET DATE: 04/07/23 referral  REFERRING DIAG: [phone] (ICD-10-CM) - S/P AKA (above knee amputation) bilateral (HCC)   THERAPY DIAG:  Muscle weakness (generalized)  Abnormal posture  Other abnormalities of gait and mobility  Rationale for Evaluation and Treatment: Rehabilitation  SUBJECTIVE:                                                                                                                                                                                             SUBJECTIVE STATEMENT: Patient reports doing  well. BG 207 at beginning of session. Purchased a BP monitor for home. Denies falls. Is getting a new CNA that she feels more comfortable with standing at home. Did go to the eye dr and patient states that her vision is progressively worsening.   Pt accompanied by: friend - Drew  PERTINENT HISTORY: DM, diabetic gastroparesis, GERD, HTN, hyperthyroidism  PAIN:  Are you having pain? No  VITALS: There were no vitals filed for this visit.    PRECAUTIONS: Fall; B diabetic retinopathy  PATIENT GOALS: "to be able to walk again"                                                                                                                              TREATMENT Self care/home management: -patient inquiring about prospects of being able to ambulate using prosthetics while legally blind- PT providing appropriate levels of encouragement and support   GAIT: -12', 15' 15; with RW + CGA  -verbal cues for increased continuity of gait with decreasing reliance on B UE  PATIENT EDUCATION: Education details: Continue HEP, see above Person educated: Patient and friend Education method: Medical illustrator Education comprehension: verbalized understanding and needs further education  HOME EXERCISE PROGRAM: -practice donning/doffing prosthetic Access Code: [email or web address] URL: [email or web address] Date: 04/20/2023 Prepared by: Nickola Baron  Exercises - Prone Hip Extension with Residual Limb (BKA)  - 1 x daily - 7 x weekly - 3 sets - 10 reps - Sidelying Hip Abduction (AKA)  - 1 x daily - 7 x weekly - 3 sets -  10 reps - Supine Piriformis Stretch with Residual Limb (AKA)  - 1 x daily - 7 x weekly - 3 sets - 10 reps  Do each exercise 1-2  times per day Do each exercise 10 repetitions Hold each exercise for 2 seconds to feel your location  AT SINK FIND YOUR MIDLINE POSITION AND PLACE FEET EQUAL DISTANCE FROM THE MIDLINE.  Try to find this position when standing still for  activities.   USE TAPE ON FLOOR TO MARK THE MIDLINE POSITION. You also should try to feel with your limb pressure in socket.  You are trying to feel with limb what you used to feel with the bottom of your foot.  Side to Side Shift: Moving your hips only (not shoulders): move weight onto your left leg, HOLD/FEEL.  Move back to equal weight on each leg, HOLD/FEEL. Move weight onto your right leg, HOLD/FEEL. Move back to equal weight on each leg, HOLD/FEEL. Repeat.  Start with both hands on sink, progress to right hand only, then no hands.  Front to Back Shift: Moving your hips only (not shoulders): move your weight forward onto your toes, HOLD/FEEL. Move your weight back to equal Flat Foot on both legs, HOLD/FEEL. Move your weight back onto your heels, HOLD/FEEL. Move your weight back to equal on both legs, HOLD/FEEL. Repeat.   tart with both hands on sink, progress to right hand only, then no hands. Moving Cones / Cups: With equal weight on each leg: Hold on with one hand the first time, then progress to no hand supports. Move cups from one side of sink to the other. Place cups ~2" out of your reach, progress to 10" beyond reach.  Place one hand in middle of sink and reach with other hand. Do both arms.  Then hover one hand and move cups with other hand. -just use a crossbody reach to end of safe weight shift ROM Overhead/Upward Reaching: alternated reaching up to top cabinets or ceiling if no cabinets present. Keep equal weight on each leg. Start with one hand support on counter while other hand reaches and progress to no hand support with reaching.  ace one hand in middle of sink and reach with other hand. Do both arms.  Then hover one hand and move cups with other hand.  5.   Looking Over Shoulders: With equal weight on each leg: alternate turning to look over your shoulders with one hand support on counter as needed.  Start with head motions only to look in front of shoulder, then even with shoulder and  progress to looking behind you. To look to side, move head /eyes, then shoulder on side looking pulls back, shift more weight to side looking and pull hip back. ace one hand in middle of sink and let go with other hand so your shoulder can pull back. Switch hands to look other way.   Then hover one hand and move cups with other hand.  6.  Stepping with leg that is not amputated:  Move items under cabinet out of your way. Shift your hips/pelvis so weight on prosthesis. SLOWLY step other leg so front of foot is in cabinet. Then step back to floor. - just hike the hip and place the foot back in the starting spot  GOALS: Goals reviewed with patient? Yes  SHORT TERM GOALS: Target date: 05/15/23  Pt will be independent with initial HEP for improved gait mechanics and balance  Baseline: to be updated, reports she has not  been doing at home doesn't trust CNA Goal status: NOT MET  2.  Patient will ambulate at least 4ft with LRAD and no more than MinA to demonstrate improved mobility  Baseline: 31ft // bars + MinA, 10 feet with 2WW + minA Goal status: NOT met  3.  Patient will complete sit <> stand with LRAD and no more than CGA for improved independence with transfers Baseline: MinA, required minA in today's session  Goal status: NOT met - required minA due to posterior instability  4.  Patient will complete floor transfer with assist as initial step of fall recovery  Baseline: unable to complete on eval; MinA Goal status: MET   LONG TERM GOALS: Target date: 07/10/23  Pt will be independent with final HEP for improved gait mechanics and balance  Baseline: to be updated Goal status: INITIAL  Patient will ambulate at least 65ft with LRAD, ModI to demonstrate improved mobility  Baseline: 49ft // bars + MinA Goal status: INITIAL  Patient will complete sit <> stand with LRAD. SBA for improved independence with transfers Baseline: MinA Goal status: REVISED  Patient will complete floor transfer  without assist as initial step of fall recovery  Baseline: unable to complete on eval Goal status: INITIAL  5. Patient will don/doff prosthetics independently   Baseline: MaxA  Goal status: INITIAL  ASSESSMENT:  CLINICAL IMPRESSION: Patient seen for skilled PT session with emphasis on gait training with prosthetics. BG much more appropriate today. L prosthetic continues to ER and appears to be functionally longer than R prosthetic resulting in increased difficulty advancing L side. This requires a significantly greater R lateral weight shift in order to clear L LE, resulting in greater risk for LOB/fall. She is requiring less assist for maintaining/regaining dynamic standing balance, but BOS is still much smaller than premorbid. Continue POC.   OBJECTIVE IMPAIRMENTS: Abnormal gait, decreased activity tolerance, decreased balance, decreased endurance, decreased knowledge of condition, decreased knowledge of use of DME, decreased strength, impaired vision/preception, and prosthetic dependency .   ACTIVITY LIMITATIONS: carrying, lifting, standing, squatting, transfers, hygiene/grooming, locomotion level, and caring for others  PARTICIPATION LIMITATIONS: meal prep, cleaning, interpersonal relationship, driving, shopping, community activity, and occupation  PERSONAL FACTORS: Age, Behavior pattern, Past/current experiences, Social background, Time since onset of injury/illness/exacerbation, Transportation, and 3+ comorbidities: see above  are also affecting patient's functional outcome.   REHAB POTENTIAL: Good  CLINICAL DECISION MAKING: Stable/uncomplicated  EVALUATION COMPLEXITY: Low  PLAN:  PT FREQUENCY: 2x/week  PT DURATION: 12 weeks  PLANNED INTERVENTIONS: 97164- PT Re-evaluation, 97110-Therapeutic exercises, 97530- Therapeutic activity, 97112- Neuromuscular re-education, 97535- Self Care, [id number]- Manual therapy, [phone]- Gait training, [phone]- Prosthetic training, [phone]- Aquatic Therapy,  Patient/Family education, Balance training, Stair training, Scar mobilization, Vestibular training, Visual/preceptual remediation/compensation, DME instructions, and Wheelchair mobility training  PLAN FOR NEXT SESSION: Gait, balance, floor transfers  how is modified sink HEP?, balance recovery in standing/static stability w/ dec UE reliance, walking w/ 2WW, monitor vitals     Rebecca Campus, PT Rebecca Campus, PT, DPT, CBIS   06/22/2023, 2:59 PM

## 2023-06-25 ENCOUNTER — Encounter: Payer: Self-pay | Admitting: Physical Therapy

## 2023-06-25 ENCOUNTER — Encounter: Admitting: Occupational Therapy

## 2023-06-25 ENCOUNTER — Ambulatory Visit: Payer: 59 | Admitting: Physical Therapy

## 2023-06-25 VITALS — BP 136/82 | HR 101

## 2023-06-25 DIAGNOSIS — R2689 Other abnormalities of gait and mobility: Secondary | ICD-10-CM

## 2023-06-25 DIAGNOSIS — R29898 Other symptoms and signs involving the musculoskeletal system: Secondary | ICD-10-CM

## 2023-06-25 DIAGNOSIS — R293 Abnormal posture: Secondary | ICD-10-CM

## 2023-06-25 DIAGNOSIS — M6281 Muscle weakness (generalized): Secondary | ICD-10-CM | POA: Diagnosis not present

## 2023-06-25 NOTE — Therapy (Signed)
 OUTPATIENT PHYSICAL THERAPY NEURO TREATMENT - 20th visit PN   Patient Name: Kelli Hudson MRN: [id number] DOB:1990-12-27, 33 y.o., female Today's Date: 06/25/2023   PCP: Vevelyn Gowers, NP REFERRING PROVIDER: Gearldean Keepers, MD  PT progress note for Kelli Hudson.  Reporting period 05/28/2023 to 06/25/2023  See Note below for Objective Data and Assessment of Progress/Goals  Thank you for the referral of this patient. Marilou Showman, PT, DPT  END OF SESSION:  PT End of Session - 06/25/23 1410     Visit Number 20    Number of Visits 25    Date for PT Re-Evaluation 07/10/23    Authorization Type Medicare/Medicaid    Progress Note Due on Visit 20    PT Start Time 1404    PT Stop Time 1449    PT Time Calculation (min) 45 min    Equipment Utilized During Treatment Gait belt    Activity Tolerance Patient tolerated treatment well    Behavior During Therapy WFL for tasks assessed/performed             Past Medical History:  Diagnosis Date   Acute H. pylori gastric ulcer    Coffee ground emesis    Diabetes mellitus (HCC)    Diabetic gastroparesis (HCC)    DKA (diabetic ketoacidosis) (HCC) 02/24/2021   Gastroparesis    GERD (gastroesophageal reflux disease)    Hypertension    Hyperthyroidism    Intractable nausea and vomiting 04/20/2021   Normocytic anemia 04/20/2020   Prolonged Q-T interval on ECG    Past Surgical History:  Procedure Laterality Date   AMPUTATION Bilateral 10/24/2022   Procedure: BILATERAL BELOW KNEE AMPUTATION;  Surgeon: Timothy Ford, MD;  Location: [facility] OR;  Service: Orthopedics;  Laterality: Bilateral;   AMPUTATION Bilateral 10/08/2022   Procedure: BILATERAL LEG DEBRIDEMENT;  Surgeon: Timothy Ford, MD;  Location: [facility] OR;  Service: Orthopedics;  Laterality: Bilateral;   AMPUTATION Bilateral 11/14/2022   Procedure: BILATERAL ABOVE KNEE AMPUTATION;  Surgeon: Timothy Ford, MD;  Location: [facility] OR;  Service: Orthopedics;  Laterality:  Bilateral;   AMPUTATION TOE Left 03/10/2018   Procedure: AMPUTATION FIFTH TOE;  Surgeon: Charity Conch, DPM;  Location: [facility];  Service: Podiatry;  Laterality: Left;   APPLICATION OF WOUND VAC Bilateral 10/12/2022   Procedure: APPLICATION OF WOUND VAC BILATERAL LEGS;  Surgeon: Hardy Lia, MD;  Location: [facility] OR;  Service: Orthopedics;  Laterality: Bilateral;   APPLICATION OF WOUND VAC Bilateral 10/24/2022   Procedure: APPLICATION OF WOUND VAC;  Surgeon: Timothy Ford, MD;  Location: [facility] OR;  Service: Orthopedics;  Laterality: Bilateral;   BIOPSY  01/28/2020   Procedure: BIOPSY;  Surgeon: Felecia Hopper, MD;  Location: [facility];  Service: Gastroenterology;;   BOTOX  INJECTION  08/26/2021   Procedure: BOTOX  INJECTION;  Surgeon: Albertina Hugger, MD;  Location: [facility];  Service: Gastroenterology;;   ESOPHAGOGASTRODUODENOSCOPY N/A 01/28/2020   Procedure: ESOPHAGOGASTRODUODENOSCOPY (EGD);  Surgeon: Felecia Hopper, MD;  Location: [facility];  Service: Gastroenterology;  Laterality: N/A;   ESOPHAGOGASTRODUODENOSCOPY (EGD) WITH PROPOFOL  Left 09/08/2015   Procedure: ESOPHAGOGASTRODUODENOSCOPY (EGD) WITH PROPOFOL ;  Surgeon: Evangeline Hilts, MD;  Location: [facility];  Service: Endoscopy;  Laterality: Left;   ESOPHAGOGASTRODUODENOSCOPY (EGD) WITH PROPOFOL  N/A 08/26/2021   Procedure: ESOPHAGOGASTRODUODENOSCOPY (EGD) WITH PROPOFOL ;  Surgeon: Albertina Hugger, MD;  Location: [facility];  Service: Gastroenterology;  Laterality: N/A;   GASTRIC STIMULATOR IMPLANT SURGERY  06/2022   at [facility]   I & D EXTREMITY  Bilateral 10/12/2022   Procedure: IRRIGATION AND  DEBRIDEMENT  BILATERAL LEGS;  Surgeon: Hardy Lia, MD;  Location: [facility] OR;  Service: Orthopedics;  Laterality: Bilateral;   I & D EXTREMITY Right 10/13/2022   Procedure: RIGHT THIGH WOUND VAC EXCHANGE;  Surgeon: Laneta Pintos, MD;  Location: [facility] OR;  Service: Orthopedics;  Laterality: Right;   I & D EXTREMITY  Bilateral 10/17/2022   Procedure: BILATERAL THIGH AND LEG DEBRIDEMENT, PARTIAL BILATERAL FOOT AMPUTATION;  Surgeon: Timothy Ford, MD;  Location: [facility] OR;  Service: Orthopedics;  Laterality: Bilateral;   I & D EXTREMITY Bilateral 11/05/2022   Procedure: BILATERAL THIGH DEBRIDEMENT;  Surgeon: Timothy Ford, MD;  Location: [facility] OR;  Service: Orthopedics;  Laterality: Bilateral;   I & D EXTREMITY Bilateral 11/07/2022   Procedure: BILATERAL THIGH AND LEG DEBRIDEMENT;  Surgeon: Timothy Ford, MD;  Location: [facility] OR;  Service: Orthopedics;  Laterality: Bilateral;   I & D EXTREMITY Bilateral 11/12/2022   Procedure: BILATERAL LEG DEBRIDEMENTS;  Surgeon: Timothy Ford, MD;  Location: [facility] OR;  Service: Orthopedics;  Laterality: Bilateral;   IRRIGATION AND DEBRIDEMENT FOOT Bilateral 10/05/2022   Procedure: IRRIGATION AND DEBRIDEMENT FOOT;  Surgeon: Dot Gazella, DPM;  Location: [facility];  Service: Orthopedics/Podiatry;  Laterality: Bilateral;   WISDOM TOOTH EXTRACTION     Patient Active Problem List   Diagnosis Date Noted   Influenza A 04/05/2023   Elevated lipase 04/05/2023   Esophagitis 03/24/2023   SIRS (systemic inflammatory response syndrome) (HCC) 03/02/2023   Fever 03/02/2023   Metabolic acidosis 03/02/2023   Hypertensive urgency 02/21/2023   Chronic pain 02/21/2023   Abscess of thigh 11/28/2022   C. 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   Hypokalemia 11/27/2022   Effusion, left knee 11/05/2022   MDD (major depressive disorder), recurrent episode, moderate (HCC) 10/16/2022   Necrotizing fasciitis of pelvic region and thigh (HCC) 10/08/2022   Necrotizing fasciitis of lower leg (HCC) 10/08/2022   Necrotizing fasciitis (HCC) 10/08/2022   Sepsis due to cellulitis (HCC) 10/03/2022   Cellulitis of lower extremity 10/03/2022   Multinodular goiter 06/18/2022   Malnutrition of moderate degree 04/18/2022   Intractable vomiting with nausea 04/17/2022    DKA (diabetic ketoacidosis) (HCC) 03/04/2022   Nausea vomiting and diarrhea 12/25/2021   Diabetic gastroparesis (HCC) 10/09/2021   Drug-seeking behavior 09/21/2021   Essential hypertension 08/25/2021   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) 08/25/2021   GERD without esophagitis 08/25/2021   Hematemesis    Prolonged Q-T interval on ECG    Nausea and vomiting 07/20/2021   Anemia of chronic disease 06/06/2021   Dehydration 06/04/2021   Hypomagnesemia 04/21/2021   Ulcerated, foot, left, with fat layer exposed (HCC) 04/20/2021   Uncontrolled type 1 diabetes mellitus with hyperglycemia, with long-term current use of insulin  (HCC) 12/18/2020   DM type 1 (diabetes mellitus, type 1) (HCC) 12/18/2020   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) 11/09/2020   Diabetic retinopathy of both eyes associated with type 1 diabetes mellitus (HCC) 09/24/2020   Diabetic gastroparesis associated with type 1 diabetes mellitus (HCC) 08/12/2020   Acute kidney injury superimposed on chronic kidney disease (HCC) 07/25/2020   Generalized abdominal pain 07/23/2020   GERD (gastroesophageal reflux disease) 07/23/2020   Diabetic nephropathy associated with type 1 diabetes mellitus (HCC) 06/27/2020   Sepsis (HCC) 05/27/2020   HLD (hyperlipidemia) 05/23/2020   Sinus tachycardia 05/09/2020   Anemia 04/20/2020  Depression with anxiety 07/05/2019   Diabetic polyneuropathy associated with type 1 diabetes mellitus (HCC) 09/24/2017   Gastroparesis    Goiter 10/05/2009   DM2 (diabetes mellitus, type 2) (HCC) 10/05/2009   Hypertension associated with diabetes (HCC) 10/05/2009    ONSET DATE: 04/07/23 referral  REFERRING DIAG: [phone] (ICD-10-CM) - S/P AKA (above knee amputation) bilateral (HCC)   THERAPY DIAG:  Muscle weakness (generalized)  Abnormal posture  Other abnormalities of gait and mobility  Other symptoms and signs involving the musculoskeletal  system  Rationale for Evaluation and Treatment: Rehabilitation  SUBJECTIVE:                                                                                                                                                                                             SUBJECTIVE STATEMENT: Patient reports doing well. BG 225 45 minutes prior to beginning of session (sensor expired when trying to pull up during session). Purchased a BP monitor for home. Denies falls.  She does not have a new CNA yet, but is "very close".   Pt accompanied by: friend - Drew  PERTINENT HISTORY: DM, diabetic gastroparesis, GERD, HTN, hyperthyroidism  PAIN:  Are you having pain? No  VITALS: Vitals:   06/25/23 1413  BP: 136/82  Pulse: (!) 101      PRECAUTIONS: Fall; B diabetic retinopathy  PATIENT GOALS: "to be able to walk again"                                                                                                                              TREATMENT  -R BP prior to session: Today's Vitals   06/25/23 1413  BP: 136/82  Pulse: (!) 101   GAIT: -12', 34', 1' with RW + CGA-SBA w/ +2 WC follow  -great step continuity of RLE, some ongoing lateral rotation of left socket and intermittent step disruption due to possible functional limb length discrepancy -Standing unsupported 2 minutes 11 seconds -Worked on Left prosthetic alignment in standing - possible this is just an anatomic variance with ER present prior to amputations as  pt feels stretch and "unnatural" pressure in hip w/ correction - discussed and demonstrated side-lying ITB stretch and prone hip flexor stretch to maintain hip mobility/alignment.  PATIENT EDUCATION: Education details: Continue HEP, see above for further.  Discussed goals when prosthetic componentry added and to discuss w/ prosthetist tomorrow (and current prosthetic alignment as above). Person educated: Patient and friend Education method: Software engineer Education comprehension: verbalized understanding and needs further education  HOME EXERCISE PROGRAM: -practice donning/doffing prosthetic Access Code: [email or web address] URL: [email or web address] Date: 04/20/2023 Prepared by: Nickola Baron  Exercises - Prone Hip Extension with Residual Limb (BKA)  - 1 x daily - 7 x weekly - 3 sets - 10 reps - Sidelying Hip Abduction (AKA)  - 1 x daily - 7 x weekly - 3 sets - 10 reps - Supine Piriformis Stretch with Residual Limb (AKA)  - 1 x daily - 7 x weekly - 3 sets - 10 reps  Do each exercise 1-2  times per day Do each exercise 10 repetitions Hold each exercise for 2 seconds to feel your location  AT SINK FIND YOUR MIDLINE POSITION AND PLACE FEET EQUAL DISTANCE FROM THE MIDLINE.  Try to find this position when standing still for activities.   USE TAPE ON FLOOR TO MARK THE MIDLINE POSITION. You also should try to feel with your limb pressure in socket.  You are trying to feel with limb what you used to feel with the bottom of your foot.  Side to Side Shift: Moving your hips only (not shoulders): move weight onto your left leg, HOLD/FEEL.  Move back to equal weight on each leg, HOLD/FEEL. Move weight onto your right leg, HOLD/FEEL. Move back to equal weight on each leg, HOLD/FEEL. Repeat.  Start with both hands on sink, progress to right hand only, then no hands.  Front to Back Shift: Moving your hips only (not shoulders): move your weight forward onto your toes, HOLD/FEEL. Move your weight back to equal Flat Foot on both legs, HOLD/FEEL. Move your weight back onto your heels, HOLD/FEEL. Move your weight back to equal on both legs, HOLD/FEEL. Repeat.   tart with both hands on sink, progress to right hand only, then no hands. Moving Cones / Cups: With equal weight on each leg: Hold on with one hand the first time, then progress to no hand supports. Move cups from one side of sink to the other. Place cups ~2" out of your reach, progress  to 10" beyond reach.  Place one hand in middle of sink and reach with other hand. Do both arms.  Then hover one hand and move cups with other hand. -just use a crossbody reach to end of safe weight shift ROM Overhead/Upward Reaching: alternated reaching up to top cabinets or ceiling if no cabinets present. Keep equal weight on each leg. Start with one hand support on counter while other hand reaches and progress to no hand support with reaching.  ace one hand in middle of sink and reach with other hand. Do both arms.  Then hover one hand and move cups with other hand.  5.   Looking Over Shoulders: With equal weight on each leg: alternate turning to look over your shoulders with one hand support on counter as needed.  Start with head motions only to look in front of shoulder, then even with shoulder and progress to looking behind you. To look to side, move head /eyes, then shoulder on side looking pulls back, shift more weight to side  looking and pull hip back. ace one hand in middle of sink and let go with other hand so your shoulder can pull back. Switch hands to look other way.   Then hover one hand and move cups with other hand.  6.  Stepping with leg that is not amputated:  Move items under cabinet out of your way. Shift your hips/pelvis so weight on prosthesis. SLOWLY step other leg so front of foot is in cabinet. Then step back to floor. - just hike the hip and place the foot back in the starting spot  GOALS: Goals reviewed with patient? Yes  SHORT TERM GOALS: Target date: 05/15/23  Pt will be independent with initial HEP for improved gait mechanics and balance  Baseline: to be updated, reports she has not been doing at home doesn't trust CNA Goal status: NOT MET  2.  Patient will ambulate at least 5ft with LRAD and no more than MinA to demonstrate improved mobility  Baseline: 26ft // bars + MinA, 10 feet with 2WW + minA Goal status: NOT met  3.  Patient will complete sit <> stand with LRAD  and no more than CGA for improved independence with transfers Baseline: MinA, required minA in today's session  Goal status: NOT met - required minA due to posterior instability  4.  Patient will complete floor transfer with assist as initial step of fall recovery  Baseline: unable to complete on eval; MinA Goal status: MET   LONG TERM GOALS: Target date: 07/10/23  Pt will be independent with final HEP for improved gait mechanics and balance  Baseline: to be updated Goal status: INITIAL  Patient will ambulate at least 14ft with LRAD, ModI to demonstrate improved mobility  Baseline: 31ft // bars + MinA Goal status: INITIAL  Patient will complete sit <> stand with LRAD. SBA for improved independence with transfers Baseline: MinA Goal status: REVISED  Patient will complete floor transfer without assist as initial step of fall recovery  Baseline: unable to complete on eval Goal status: INITIAL  5. Patient will don/doff prosthetics independently   Baseline: MaxA  Goal status: INITIAL  ASSESSMENT:  CLINICAL IMPRESSION: Patient seen for skilled PT session with emphasis on gait training with prosthetics.  Vitals remain WFL today and pt was highly motivated.  Able to ambulate 77 ft continuously at farthest distance today w/ some instances of decreased assistance from PT.  Her left prosthesis continues to ER, but this may be a normal anatomical variant of her natural hip alignment.  She was educated on stretching hips to facilitate eventual progression to taller componentry.  Will continue per POC.  OBJECTIVE IMPAIRMENTS: Abnormal gait, decreased activity tolerance, decreased balance, decreased endurance, decreased knowledge of condition, decreased knowledge of use of DME, decreased strength, impaired vision/preception, and prosthetic dependency .   ACTIVITY LIMITATIONS: carrying, lifting, standing, squatting, transfers, hygiene/grooming, locomotion level, and caring for  others  PARTICIPATION LIMITATIONS: meal prep, cleaning, interpersonal relationship, driving, shopping, community activity, and occupation  PERSONAL FACTORS: Age, Behavior pattern, Past/current experiences, Social background, Time since onset of injury/illness/exacerbation, Transportation, and 3+ comorbidities: see above are also affecting patient's functional outcome.   REHAB POTENTIAL: Good  CLINICAL DECISION MAKING: Stable/uncomplicated  EVALUATION COMPLEXITY: Low  PLAN:  PT FREQUENCY: 2x/week  PT DURATION: 12 weeks  PLANNED INTERVENTIONS: 97164- PT Re-evaluation, 97110-Therapeutic exercises, 97530- Therapeutic activity, [id number]- Neuromuscular re-education, 97535- Self Care, [id number]- Manual therapy, [phone]- Gait training, [phone]- Prosthetic training, [phone]- Aquatic Therapy, Patient/Family education, Balance training, Stair training, Scar mobilization,  Vestibular training, Visual/preceptual remediation/compensation, DME instructions, and Wheelchair mobility training  PLAN FOR NEXT SESSION: Gait, balance, floor transfers  how is modified sink HEP?, balance recovery in standing/static stability w/ dec UE reliance, walking w/ 2WW, monitor vitals     Earlean Glaze, PT, DPT   06/25/2023, 2:51 PM

## 2023-06-29 ENCOUNTER — Encounter: Payer: Self-pay | Admitting: Physical Therapy

## 2023-06-29 ENCOUNTER — Ambulatory Visit: Payer: 59 | Admitting: Physical Therapy

## 2023-06-29 ENCOUNTER — Encounter: Admitting: Occupational Therapy

## 2023-06-29 VITALS — BP 137/85 | HR 90

## 2023-06-29 DIAGNOSIS — R2689 Other abnormalities of gait and mobility: Secondary | ICD-10-CM

## 2023-06-29 DIAGNOSIS — M6281 Muscle weakness (generalized): Secondary | ICD-10-CM

## 2023-06-29 DIAGNOSIS — R293 Abnormal posture: Secondary | ICD-10-CM

## 2023-06-29 NOTE — Therapy (Signed)
 OUTPATIENT PHYSICAL THERAPY NEURO TREATMENT   Patient Name: Joselynne Brianna Johnson-Alston MRN: [id number] DOB:[birth year], 33 y.o., female Today's Date: 06/29/2023   PCP: Vevelyn Gowers, NP REFERRING PROVIDER: Gearldean Keepers, MD  PT progress note for Erdine Johnson-Alston.  Reporting period 05/28/2023 to 06/25/2023  See Note below for Objective Data and Assessment of Progress/Goals  Thank you for the referral of this patient. Marilou Showman, PT, DPT  END OF SESSION:  PT End of Session - 06/29/23 1315     Visit Number 21    Number of Visits 25    Date for PT Re-Evaluation 07/10/23    Authorization Type Medicare/Medicaid    PT Start Time 1312    PT Stop Time 1406    PT Time Calculation (min) 54 min    Equipment Utilized During Treatment Gait belt    Activity Tolerance Patient tolerated treatment well    Behavior During Therapy WFL for tasks assessed/performed             Past Medical History:  Diagnosis Date   Acute H. pylori gastric ulcer    Coffee ground emesis    Diabetes mellitus (HCC)    Diabetic gastroparesis (HCC)    DKA (diabetic ketoacidosis) (HCC) 02/24/2021   Gastroparesis    GERD (gastroesophageal reflux disease)    Hypertension    Hyperthyroidism    Intractable nausea and vomiting 04/20/2021   Normocytic anemia 04/20/2020   Prolonged Q-T interval on ECG    Past Surgical History:  Procedure Laterality Date   AMPUTATION Bilateral 10/24/2022   Procedure: BILATERAL BELOW KNEE AMPUTATION;  Surgeon: Timothy Ford, MD;  Location: [facility] OR;  Service: Orthopedics;  Laterality: Bilateral;   AMPUTATION Bilateral 10/08/2022   Procedure: BILATERAL LEG DEBRIDEMENT;  Surgeon: Timothy Ford, MD;  Location: [facility] OR;  Service: Orthopedics;  Laterality: Bilateral;   AMPUTATION Bilateral 11/14/2022   Procedure: BILATERAL ABOVE KNEE AMPUTATION;  Surgeon: Timothy Ford, MD;  Location: [facility] OR;  Service: Orthopedics;  Laterality: Bilateral;   AMPUTATION TOE Left 03/10/2018    Procedure: AMPUTATION FIFTH TOE;  Surgeon: Charity Conch, DPM;  Location: [facility];  Service: Podiatry;  Laterality: Left;   APPLICATION OF WOUND VAC Bilateral 10/12/2022   Procedure: APPLICATION OF WOUND VAC BILATERAL LEGS;  Surgeon: Hardy Lia, MD;  Location: [facility] OR;  Service: Orthopedics;  Laterality: Bilateral;   APPLICATION OF WOUND VAC Bilateral 10/24/2022   Procedure: APPLICATION OF WOUND VAC;  Surgeon: Timothy Ford, MD;  Location: [facility] OR;  Service: Orthopedics;  Laterality: Bilateral;   BIOPSY  01/28/2020   Procedure: BIOPSY;  Surgeon: Felecia Hopper, MD;  Location: [facility];  Service: Gastroenterology;;   BOTOX  INJECTION  08/26/2021   Procedure: BOTOX  INJECTION;  Surgeon: Albertina Hugger, MD;  Location: [facility];  Service: Gastroenterology;;   ESOPHAGOGASTRODUODENOSCOPY N/A 01/28/2020   Procedure: ESOPHAGOGASTRODUODENOSCOPY (EGD);  Surgeon: Felecia Hopper, MD;  Location: [facility];  Service: Gastroenterology;  Laterality: N/A;   ESOPHAGOGASTRODUODENOSCOPY (EGD) WITH PROPOFOL  Left 09/08/2015   Procedure: ESOPHAGOGASTRODUODENOSCOPY (EGD) WITH PROPOFOL ;  Surgeon: Evangeline Hilts, MD;  Location: [facility]COPY;  Service: Endoscopy;  Laterality: Left;   ESOPHAGOGASTRODUODENOSCOPY (EGD) WITH PROPOFOL  N/A 08/26/2021   Procedure: ESOPHAGOGASTRODUODENOSCOPY (EGD) WITH PROPOFOL ;  Surgeon: Albertina Hugger, MD;  Location: [facility];  Service: Gastroenterology;  Laterality: N/A;   GASTRIC STIMULATOR IMPLANT SURGERY  06/2022   at [facility]   I & D EXTREMITY Bilateral 10/12/2022   Procedure: IRRIGATION AND  DEBRIDEMENT  BILATERAL LEGS;  Surgeon: Hardy Lia, MD;  Location: [facility] OR;  Service: Orthopedics;  Laterality: Bilateral;   I & D EXTREMITY Right 10/13/2022   Procedure: RIGHT THIGH WOUND VAC EXCHANGE;  Surgeon: Laneta Pintos, MD;  Location: [facility] OR;  Service: Orthopedics;  Laterality: Right;   I & D EXTREMITY Bilateral 10/17/2022   Procedure: BILATERAL THIGH  AND LEG DEBRIDEMENT, PARTIAL BILATERAL FOOT AMPUTATION;  Surgeon: Timothy Ford, MD;  Location: [facility] OR;  Service: Orthopedics;  Laterality: Bilateral;   I & D EXTREMITY Bilateral 11/05/2022   Procedure: BILATERAL THIGH DEBRIDEMENT;  Surgeon: Timothy Ford, MD;  Location: [facility] OR;  Service: Orthopedics;  Laterality: Bilateral;   I & D EXTREMITY Bilateral 11/07/2022   Procedure: BILATERAL THIGH AND LEG DEBRIDEMENT;  Surgeon: Timothy Ford, MD;  Location: [facility] OR;  Service: Orthopedics;  Laterality: Bilateral;   I & D EXTREMITY Bilateral 11/12/2022   Procedure: BILATERAL LEG DEBRIDEMENTS;  Surgeon: Timothy Ford, MD;  Location: [facility] OR;  Service: Orthopedics;  Laterality: Bilateral;   IRRIGATION AND DEBRIDEMENT FOOT Bilateral 10/05/2022   Procedure: IRRIGATION AND DEBRIDEMENT FOOT;  Surgeon: Dot Gazella, DPM;  Location: [facility];  Service: Orthopedics/Podiatry;  Laterality: Bilateral;   WISDOM TOOTH EXTRACTION     Patient Active Problem List   Diagnosis Date Noted   Influenza A 04/05/2023   Elevated lipase 04/05/2023   Esophagitis 03/24/2023   SIRS (systemic inflammatory response syndrome) (HCC) 03/02/2023   Fever 03/02/2023   Metabolic acidosis 03/02/2023   Hypertensive urgency 02/21/2023   Chronic pain 02/21/2023   Abscess of thigh 11/28/2022   C. 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   Hypokalemia 11/27/2022   Effusion, left knee 11/05/2022   MDD (major depressive disorder), recurrent episode, moderate (HCC) 10/16/2022   Necrotizing fasciitis of pelvic region and thigh (HCC) 10/08/2022   Necrotizing fasciitis of lower leg (HCC) 10/08/2022   Necrotizing fasciitis (HCC) 10/08/2022   Sepsis due to cellulitis (HCC) 10/03/2022   Cellulitis of lower extremity 10/03/2022   Multinodular goiter 06/18/2022   Malnutrition of moderate degree 04/18/2022   Intractable vomiting with nausea 04/17/2022   DKA (diabetic ketoacidosis) (HCC) 03/04/2022    Nausea vomiting and diarrhea 12/25/2021   Diabetic gastroparesis (HCC) 10/09/2021   Drug-seeking behavior 09/21/2021   Essential hypertension 08/25/2021   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) 08/25/2021   GERD without esophagitis 08/25/2021   Hematemesis    Prolonged Q-T interval on ECG    Nausea and vomiting 07/20/2021   Anemia of chronic disease 06/06/2021   Dehydration 06/04/2021   Hypomagnesemia 04/21/2021   Ulcerated, foot, left, with fat layer exposed (HCC) 04/20/2021   Uncontrolled type 1 diabetes mellitus with hyperglycemia, with long-term current use of insulin  (HCC) 12/18/2020   DM type 1 (diabetes mellitus, type 1) (HCC) 12/18/2020   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) 11/09/2020   Diabetic retinopathy of both eyes associated with type 1 diabetes mellitus (HCC) 09/24/2020   Diabetic gastroparesis associated with type 1 diabetes mellitus (HCC) 08/12/2020   Acute kidney injury superimposed on chronic kidney disease (HCC) 07/25/2020   Generalized abdominal pain 07/23/2020   GERD (gastroesophageal reflux disease) 07/23/2020   Diabetic nephropathy associated with type 1 diabetes mellitus (HCC) 06/27/2020   Sepsis (HCC) 05/27/2020   HLD (hyperlipidemia) 05/23/2020   Sinus tachycardia 05/09/2020   Anemia 04/20/2020   Depression with anxiety 07/05/2019   Diabetic polyneuropathy associated with type 1  diabetes mellitus (HCC) 09/24/2017   Gastroparesis    Goiter 10/05/2009   DM2 (diabetes mellitus, type 2) (HCC) 10/05/2009   Hypertension associated with diabetes (HCC) 10/05/2009    ONSET DATE: 04/07/23 referral  REFERRING DIAG: [phone] (ICD-10-CM) - S/P AKA (above knee amputation) bilateral (HCC)   THERAPY DIAG:  Muscle weakness (generalized)  Abnormal posture  Other abnormalities of gait and mobility  Rationale for Evaluation and Treatment: Rehabilitation  SUBJECTIVE:                                                                                                                                                                                              SUBJECTIVE STATEMENT: Patient reports doing well. Patient reports no that she is approved for new legs and new knee and has a follow up on 6/16. Denies falls and near falls. Blood glucose was 340 prior to start of session.  Pt accompanied by: friend - Drew  PERTINENT HISTORY: DM, diabetic gastroparesis, GERD, HTN, hyperthyroidism  PAIN:  Are you having pain? No  VITALS: Vitals:   06/29/23 1323  BP: 137/85  Pulse: 90    PRECAUTIONS: Fall; B diabetic retinopathy  PATIENT GOALS: "to be able to walk again"                                                                                                                              TREATMENT  Self Care: Today's Vitals   06/29/23 1323  BP: 137/85  Pulse: 90   Vitals assessed as noted above Discussed elevated glucose readings and recommend close monitoring even after session as very high today, patient reports having non-sugar free gum and PT recommends finding alternative sugar free options in future for more controlled glucose response   Prosthetic care:  Discussed plan with upcoming prosthetic, to be fit 6/16, patient reports new knee will be blue and may be single axle knee joint but PT and patient unsure of exactly what type they are looking at for her, discussed likelihood of getting new socket with new component parts Patient able to don  socket and fully secure modI today  GAIT: -15', 33', 2' with RW (handlebars on lowest rung setting) + CGA-SBA w/ +2 WC follow -intermittent rotation of both sockets with gait but did not require adjustment as patient was able to compensate well, majority of gait was SBA with CGA only with fatigue, patient intermittently with L leg catching on floor but no LOB and corrects well with appropriate pacing; patient limited by pain/fatigue in low back with walking    NMR:  -Standing unsupported standing at ballet bar with therapist SBA and wheelchair locked behind 2 minutes 27 seconds (38 seconds on initial trial)  -Reviewed setup for foot placement in stand but required less cueing than prior sessions, discussed plan in future session at progressing static balance tasks   PATIENT EDUCATION: Education details: Continue HEP Person educated: Patient and friend Education method: Medical illustrator Education comprehension: verbalized understanding and needs further education  HOME EXERCISE PROGRAM: -practice donning/doffing prosthetic Access Code: [email or web address] URL: [email or web address] Date: 04/20/2023 Prepared by: Nickola Baron  Exercises - Prone Hip Extension with Residual Limb (BKA)  - 1 x daily - 7 x weekly - 3 sets - 10 reps - Sidelying Hip Abduction (AKA)  - 1 x daily - 7 x weekly - 3 sets - 10 reps - Supine Piriformis Stretch with Residual Limb (AKA)  - 1 x daily - 7 x weekly - 3 sets - 10 reps  Do each exercise 1-2  times per day Do each exercise 10 repetitions Hold each exercise for 2 seconds to feel your location  AT SINK FIND YOUR MIDLINE POSITION AND PLACE FEET EQUAL DISTANCE FROM THE MIDLINE.  Try to find this position when standing still for activities.   USE TAPE ON FLOOR TO MARK THE MIDLINE POSITION. You also should try to feel with your limb pressure in socket.  You are trying to feel with limb what you used to feel with the bottom of your foot.  Side to Side Shift: Moving your hips only (not shoulders): move weight onto your left leg, HOLD/FEEL.  Move back to equal weight on each leg, HOLD/FEEL. Move weight onto your right leg, HOLD/FEEL. Move back to equal weight on each leg, HOLD/FEEL. Repeat.  Start with both hands on sink, progress to right hand only, then no hands.  Front to Back Shift: Moving your hips only (not shoulders): move your weight forward onto your toes, HOLD/FEEL. Move your weight back to  equal Flat Foot on both legs, HOLD/FEEL. Move your weight back onto your heels, HOLD/FEEL. Move your weight back to equal on both legs, HOLD/FEEL. Repeat.   tart with both hands on sink, progress to right hand only, then no hands. Moving Cones / Cups: With equal weight on each leg: Hold on with one hand the first time, then progress to no hand supports. Move cups from one side of sink to the other. Place cups ~2" out of your reach, progress to 10" beyond reach.  Place one hand in middle of sink and reach with other hand. Do both arms.  Then hover one hand and move cups with other hand. -just use a crossbody reach to end of safe weight shift ROM Overhead/Upward Reaching: alternated reaching up to top cabinets or ceiling if no cabinets present. Keep equal weight on each leg. Start with one hand support on counter while other hand reaches and progress to no hand support with reaching.  ace one hand in middle of sink and reach with other hand. Do  both arms.  Then hover one hand and move cups with other hand.  5.   Looking Over Shoulders: With equal weight on each leg: alternate turning to look over your shoulders with one hand support on counter as needed.  Start with head motions only to look in front of shoulder, then even with shoulder and progress to looking behind you. To look to side, move head /eyes, then shoulder on side looking pulls back, shift more weight to side looking and pull hip back. ace one hand in middle of sink and let go with other hand so your shoulder can pull back. Switch hands to look other way.   Then hover one hand and move cups with other hand.  6.  Stepping with leg that is not amputated:  Move items under cabinet out of your way. Shift your hips/pelvis so weight on prosthesis. SLOWLY step other leg so front of foot is in cabinet. Then step back to floor. - just hike the hip and place the foot back in the starting spot  GOALS: Goals reviewed with patient? Yes  SHORT TERM GOALS:  Target date: 05/15/23  Pt will be independent with initial HEP for improved gait mechanics and balance  Baseline: to be updated, reports she has not been doing at home doesn't trust CNA Goal status: NOT MET  2.  Patient will ambulate at least 66ft with LRAD and no more than MinA to demonstrate improved mobility  Baseline: 48ft // bars + MinA, 10 feet with 2WW + minA Goal status: NOT met  3.  Patient will complete sit <> stand with LRAD and no more than CGA for improved independence with transfers Baseline: MinA, required minA in today's session  Goal status: NOT met - required minA due to posterior instability  4.  Patient will complete floor transfer with assist as initial step of fall recovery  Baseline: unable to complete on eval; MinA Goal status: MET   LONG TERM GOALS: Target date: 07/10/23  Pt will be independent with final HEP for improved gait mechanics and balance  Baseline: to be updated Goal status: INITIAL  Patient will ambulate at least 72ft with LRAD, ModI to demonstrate improved mobility  Baseline: 73ft // bars + MinA Goal status: INITIAL  Patient will complete sit <> stand with LRAD. SBA for improved independence with transfers Baseline: MinA Goal status: REVISED  Patient will complete floor transfer without assist as initial step of fall recovery  Baseline: unable to complete on eval Goal status: INITIAL  5. Patient will don/doff prosthetics independently   Baseline: MaxA  Goal status: INITIAL  ASSESSMENT:  CLINICAL IMPRESSION: Patient seen for skilled PT session with emphasis on gait training with prosthetics and progressing static balance.  Patient max distance in today' sessions was 87' with majority of time SBA. Patient hoping to work more on static balance tasks next session. Will continue per POC.  OBJECTIVE IMPAIRMENTS: Abnormal gait, decreased activity tolerance, decreased balance, decreased endurance, decreased knowledge of condition, decreased  knowledge of use of DME, decreased strength, impaired vision/preception, and prosthetic dependency .   ACTIVITY LIMITATIONS: carrying, lifting, standing, squatting, transfers, hygiene/grooming, locomotion level, and caring for others  PARTICIPATION LIMITATIONS: meal prep, cleaning, interpersonal relationship, driving, shopping, community activity, and occupation  PERSONAL FACTORS: Age, Behavior pattern, Past/current experiences, Social background, Time since onset of injury/illness/exacerbation, Transportation, and 3+ comorbidities: see above are also affecting patient's functional outcome.   REHAB POTENTIAL: Good  CLINICAL DECISION MAKING: Stable/uncomplicated  EVALUATION COMPLEXITY: Low  PLAN:  PT FREQUENCY: 2x/week  PT DURATION: 12 weeks  PLANNED INTERVENTIONS: 97164- PT Re-evaluation, 97110-Therapeutic exercises, 97530- Therapeutic activity, 97112- Neuromuscular re-education, 97535- Self Care, 86578- Manual therapy, [phone]- Gait training, [phone]- Prosthetic training, [phone]- Aquatic Therapy, Patient/Family education, Balance training, Stair training, Scar mobilization, Vestibular training, Visual/preceptual remediation/compensation, DME instructions, and Wheelchair mobility training  PLAN FOR NEXT SESSION: Gait, balance, floor transfers  how is modified sink HEP?, balance recovery in standing/static stability w/ dec UE reliance, walking w/ 2WW, monitor vitals, work on static balance  - reaching tasks     Coreen Devoid, PT, DPT   06/29/2023, 2:24 PM

## 2023-07-02 ENCOUNTER — Encounter: Payer: Self-pay | Admitting: Physical Therapy

## 2023-07-02 ENCOUNTER — Encounter: Admitting: Occupational Therapy

## 2023-07-02 ENCOUNTER — Ambulatory Visit: Payer: 59 | Admitting: Physical Therapy

## 2023-07-02 VITALS — BP 113/73 | HR 95

## 2023-07-02 DIAGNOSIS — R2689 Other abnormalities of gait and mobility: Secondary | ICD-10-CM

## 2023-07-02 DIAGNOSIS — M6281 Muscle weakness (generalized): Secondary | ICD-10-CM

## 2023-07-02 NOTE — Therapy (Signed)
 OUTPATIENT PHYSICAL THERAPY NEURO TREATMENT   Patient Name: Kelli Hudson MRN: [id number] DOB:06-May-1990, 33 y.o., female Today's Date: 07/02/2023   PCP: Vevelyn Gowers, NP REFERRING PROVIDER: Gearldean Keepers, MD  PT progress note for Kelli Hudson.  Reporting period 05/28/2023 to 06/25/2023  See Note below for Objective Data and Assessment of Progress/Goals  Thank you for the referral of this patient. Marilou Showman, PT, DPT  END OF SESSION:  PT End of Session - 07/02/23 1410     Visit Number 22    Number of Visits 25    Date for PT Re-Evaluation 07/10/23    Authorization Type Medicare/Medicaid    PT Start Time 1316    PT Stop Time 1340    PT Time Calculation (min) 24 min    Equipment Utilized During Treatment Other (comment)   seated session   Activity Tolerance Treatment limited secondary to medical complications (Comment)   low BP and fall   Behavior During Therapy Anxious   tearful            Past Medical History:  Diagnosis Date   Acute H. pylori gastric ulcer    Coffee ground emesis    Diabetes mellitus (HCC)    Diabetic gastroparesis (HCC)    DKA (diabetic ketoacidosis) (HCC) 02/24/2021   Gastroparesis    GERD (gastroesophageal reflux disease)    Hypertension    Hyperthyroidism    Intractable nausea and vomiting 04/20/2021   Normocytic anemia 04/20/2020   Prolonged Q-T interval on ECG    Past Surgical History:  Procedure Laterality Date   AMPUTATION Bilateral 10/24/2022   Procedure: BILATERAL BELOW KNEE AMPUTATION;  Surgeon: Timothy Ford, MD;  Location: [facility] OR;  Service: Orthopedics;  Laterality: Bilateral;   AMPUTATION Bilateral 10/08/2022   Procedure: BILATERAL LEG DEBRIDEMENT;  Surgeon: Timothy Ford, MD;  Location: [facility] OR;  Service: Orthopedics;  Laterality: Bilateral;   AMPUTATION Bilateral 11/14/2022   Procedure: BILATERAL ABOVE KNEE AMPUTATION;  Surgeon: Timothy Ford, MD;  Location: [facility] OR;  Service: Orthopedics;   Laterality: Bilateral;   AMPUTATION TOE Left 03/10/2018   Procedure: AMPUTATION FIFTH TOE;  Surgeon: Charity Conch, DPM;  Location: [facility];  Service: Podiatry;  Laterality: Left;   APPLICATION OF WOUND VAC Bilateral 10/12/2022   Procedure: APPLICATION OF WOUND VAC BILATERAL LEGS;  Surgeon: Hardy Lia, MD;  Location: [facility] OR;  Service: Orthopedics;  Laterality: Bilateral;   APPLICATION OF WOUND VAC Bilateral 10/24/2022   Procedure: APPLICATION OF WOUND VAC;  Surgeon: Timothy Ford, MD;  Location: [facility] OR;  Service: Orthopedics;  Laterality: Bilateral;   BIOPSY  01/28/2020   Procedure: BIOPSY;  Surgeon: Felecia Hopper, MD;  Location: [facility];  Service: Gastroenterology;;   BOTOX  INJECTION  08/26/2021   Procedure: BOTOX  INJECTION;  Surgeon: Albertina Hugger, MD;  Location: [facility];  Service: Gastroenterology;;   ESOPHAGOGASTRODUODENOSCOPY N/A 01/28/2020   Procedure: ESOPHAGOGASTRODUODENOSCOPY (EGD);  Surgeon: Felecia Hopper, MD;  Location: [facility];  Service: Gastroenterology;  Laterality: N/A;   ESOPHAGOGASTRODUODENOSCOPY (EGD) WITH PROPOFOL  Left 09/08/2015   Procedure: ESOPHAGOGASTRODUODENOSCOPY (EGD) WITH PROPOFOL ;  Surgeon: Evangeline Hilts, MD;  Location: [facility] ENDOSCOPY;  Service: Endoscopy;  Laterality: Left;   ESOPHAGOGASTRODUODENOSCOPY (EGD) WITH PROPOFOL  N/A 08/26/2021   Procedure: ESOPHAGOGASTRODUODENOSCOPY (EGD) WITH PROPOFOL ;  Surgeon: Albertina Hugger, MD;  Location: [facility];  Service: Gastroenterology;  Laterality: N/A;   GASTRIC STIMULATOR IMPLANT SURGERY  06/2022   at [facility]   I & D EXTREMITY Bilateral 10/12/2022  Procedure: IRRIGATION AND  DEBRIDEMENT  BILATERAL LEGS;  Surgeon: Hardy Lia, MD;  Location: [facility] OR;  Service: Orthopedics;  Laterality: Bilateral;   I & D EXTREMITY Right 10/13/2022   Procedure: RIGHT THIGH WOUND VAC EXCHANGE;  Surgeon: Laneta Pintos, MD;  Location: [facility] OR;  Service: Orthopedics;  Laterality: Right;   I & D  EXTREMITY Bilateral 10/17/2022   Procedure: BILATERAL THIGH AND LEG DEBRIDEMENT, PARTIAL BILATERAL FOOT AMPUTATION;  Surgeon: Timothy Ford, MD;  Location: [facility] OR;  Service: Orthopedics;  Laterality: Bilateral;   I & D EXTREMITY Bilateral 11/05/2022   Procedure: BILATERAL THIGH DEBRIDEMENT;  Surgeon: Timothy Ford, MD;  Location: [facility] OR;  Service: Orthopedics;  Laterality: Bilateral;   I & D EXTREMITY Bilateral 11/07/2022   Procedure: BILATERAL THIGH AND LEG DEBRIDEMENT;  Surgeon: Timothy Ford, MD;  Location: [facility] OR;  Service: Orthopedics;  Laterality: Bilateral;   I & D EXTREMITY Bilateral 11/12/2022   Procedure: BILATERAL LEG DEBRIDEMENTS;  Surgeon: Timothy Ford, MD;  Location: [facility] OR;  Service: Orthopedics;  Laterality: Bilateral;   IRRIGATION AND DEBRIDEMENT FOOT Bilateral 10/05/2022   Procedure: IRRIGATION AND DEBRIDEMENT FOOT;  Surgeon: Dot Gazella, DPM;  Location: [facility];  Service: Orthopedics/Podiatry;  Laterality: Bilateral;   WISDOM TOOTH EXTRACTION     Patient Active Problem List   Diagnosis Date Noted   Influenza A 04/05/2023   Elevated lipase 04/05/2023   Esophagitis 03/24/2023   SIRS (systemic inflammatory response syndrome) (HCC) 03/02/2023   Fever 03/02/2023   Metabolic acidosis 03/02/2023   Hypertensive urgency 02/21/2023   Chronic pain 02/21/2023   Abscess of thigh 11/28/2022   C. 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   Hypokalemia 11/27/2022   Effusion, left knee 11/05/2022   MDD (major depressive disorder), recurrent episode, moderate (HCC) 10/16/2022   Necrotizing fasciitis of pelvic region and thigh (HCC) 10/08/2022   Necrotizing fasciitis of lower leg (HCC) 10/08/2022   Necrotizing fasciitis (HCC) 10/08/2022   Sepsis due to cellulitis (HCC) 10/03/2022   Cellulitis of lower extremity 10/03/2022   Multinodular goiter 06/18/2022   Malnutrition of moderate degree 04/18/2022   Intractable vomiting with nausea  04/17/2022   DKA (diabetic ketoacidosis) (HCC) 03/04/2022   Nausea vomiting and diarrhea 12/25/2021   Diabetic gastroparesis (HCC) 10/09/2021   Drug-seeking behavior 09/21/2021   Essential hypertension 08/25/2021   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) 08/25/2021   GERD without esophagitis 08/25/2021   Hematemesis    Prolonged Q-T interval on ECG    Nausea and vomiting 07/20/2021   Anemia of chronic disease 06/06/2021   Dehydration 06/04/2021   Hypomagnesemia 04/21/2021   Ulcerated, foot, left, with fat layer exposed (HCC) 04/20/2021   Uncontrolled type 1 diabetes mellitus with hyperglycemia, with long-term current use of insulin  (HCC) 12/18/2020   DM type 1 (diabetes mellitus, type 1) (HCC) 12/18/2020   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) 11/09/2020   Diabetic retinopathy of both eyes associated with type 1 diabetes mellitus (HCC) 09/24/2020   Diabetic gastroparesis associated with type 1 diabetes mellitus (HCC) 08/12/2020   Acute kidney injury superimposed on chronic kidney disease (HCC) 07/25/2020   Generalized abdominal pain 07/23/2020   GERD (gastroesophageal reflux disease) 07/23/2020   Diabetic nephropathy associated with type 1 diabetes mellitus (HCC) 06/27/2020   Sepsis (HCC) 05/27/2020   HLD (hyperlipidemia) 05/23/2020   Sinus tachycardia 05/09/2020   Anemia 04/20/2020   Depression with anxiety  07/05/2019   Diabetic polyneuropathy associated with type 1 diabetes mellitus (HCC) 09/24/2017   Gastroparesis    Goiter 10/05/2009   DM2 (diabetes mellitus, type 2) (HCC) 10/05/2009   Hypertension associated with diabetes (HCC) 10/05/2009    ONSET DATE: 04/07/23 referral  REFERRING DIAG: [phone] (ICD-10-CM) - S/P AKA (above knee amputation) bilateral (HCC)   THERAPY DIAG:  Muscle weakness (generalized)  Other abnormalities of gait and mobility  Rationale for Evaluation and Treatment: Rehabilitation  SUBJECTIVE:                                                                                                                                                                                              SUBJECTIVE STATEMENT: Patient reports waking up this morning with new vision changes, precipitated by fall with bump to head later in the day, followed by elevated glucose readings. Patient reports fall when performing toilet transfer and chair slipping. Required Drew's help to get up. Patient reports being very anxious.   Pt accompanied by: friend - Drew  PERTINENT HISTORY: DM, diabetic gastroparesis, GERD, HTN, hyperthyroidism  PAIN:  Are you having pain? No  VITALS: Vitals:   07/02/23 1417  BP: 113/73  Pulse: 95     PRECAUTIONS: Fall; B diabetic retinopathy  PATIENT GOALS: "to be able to walk again"                                                                                                                              TREATMENT  Self Care: Today's Vitals   07/02/23 1417  BP: 113/73  Pulse: 95   Patient visibly shaken when describing fall earlier in the day and tearful, PT provides therapeutic listening; given fall to head, PT recommends patient go get checked out, patient reporting no major headache at this time, vision changes precipitated fall and are consistent with visual changes related to progressive low vision, PT also assess glucose which is 354 (takes ~6 minutes for system to come online), patient asks PT if should take insulin  before meals and PT defers to PCP, patient very  anxious about progressive changes to vision and PT provides information for [facility] for [employer] for patient, patient initially tearful but in better spirits by end of session  Deferred remainder of session given degree of elevated glucose readings   PATIENT EDUCATION: Education details: Continue HEP Person educated: Patient and friend Education method: Medical illustrator Education  comprehension: verbalized understanding and needs further education  HOME EXERCISE PROGRAM: -practice donning/doffing prosthetic Access Code: [email or web address] URL: [email or web address] Date: 04/20/2023 Prepared by: Nickola Baron  Exercises - Prone Hip Extension with Residual Limb (BKA)  - 1 x daily - 7 x weekly - 3 sets - 10 reps - Sidelying Hip Abduction (AKA)  - 1 x daily - 7 x weekly - 3 sets - 10 reps - Supine Piriformis Stretch with Residual Limb (AKA)  - 1 x daily - 7 x weekly - 3 sets - 10 reps  Do each exercise 1-2  times per day Do each exercise 10 repetitions Hold each exercise for 2 seconds to feel your location  AT SINK FIND YOUR MIDLINE POSITION AND PLACE FEET EQUAL DISTANCE FROM THE MIDLINE.  Try to find this position when standing still for activities.   USE TAPE ON FLOOR TO MARK THE MIDLINE POSITION. You also should try to feel with your limb pressure in socket.  You are trying to feel with limb what you used to feel with the bottom of your foot.  Side to Side Shift: Moving your hips only (not shoulders): move weight onto your left leg, HOLD/FEEL.  Move back to equal weight on each leg, HOLD/FEEL. Move weight onto your right leg, HOLD/FEEL. Move back to equal weight on each leg, HOLD/FEEL. Repeat.  Start with both hands on sink, progress to right hand only, then no hands.  Front to Back Shift: Moving your hips only (not shoulders): move your weight forward onto your toes, HOLD/FEEL. Move your weight back to equal Flat Foot on both legs, HOLD/FEEL. Move your weight back onto your heels, HOLD/FEEL. Move your weight back to equal on both legs, HOLD/FEEL. Repeat.   tart with both hands on sink, progress to right hand only, then no hands. Moving Cones / Cups: With equal weight on each leg: Hold on with one hand the first time, then progress to no hand supports. Move cups from one side of sink to the other. Place cups ~2" out of your reach, progress to 10" beyond reach.   Place one hand in middle of sink and reach with other hand. Do both arms.  Then hover one hand and move cups with other hand. -just use a crossbody reach to end of safe weight shift ROM Overhead/Upward Reaching: alternated reaching up to top cabinets or ceiling if no cabinets present. Keep equal weight on each leg. Start with one hand support on counter while other hand reaches and progress to no hand support with reaching.  ace one hand in middle of sink and reach with other hand. Do both arms.  Then hover one hand and move cups with other hand.  5.   Looking Over Shoulders: With equal weight on each leg: alternate turning to look over your shoulders with one hand support on counter as needed.  Start with head motions only to look in front of shoulder, then even with shoulder and progress to looking behind you. To look to side, move head /eyes, then shoulder on side looking pulls back, shift more weight to side looking and pull hip back. ace one  hand in middle of sink and let go with other hand so your shoulder can pull back. Switch hands to look other way.   Then hover one hand and move cups with other hand.  6.  Stepping with leg that is not amputated:  Move items under cabinet out of your way. Shift your hips/pelvis so weight on prosthesis. SLOWLY step other leg so front of foot is in cabinet. Then step back to floor. - just hike the hip and place the foot back in the starting spot  GOALS: Goals reviewed with patient? Yes  SHORT TERM GOALS: Target date: 05/15/23  Pt will be independent with initial HEP for improved gait mechanics and balance  Baseline: to be updated, reports she has not been doing at home doesn't trust CNA Goal status: NOT MET  2.  Patient will ambulate at least 97ft with LRAD and no more than MinA to demonstrate improved mobility  Baseline: 56ft // bars + MinA, 10 feet with 2WW + minA Goal status: NOT met  3.  Patient will complete sit <> stand with LRAD and no more than CGA  for improved independence with transfers Baseline: MinA, required minA in today's session  Goal status: NOT met - required minA due to posterior instability  4.  Patient will complete floor transfer with assist as initial step of fall recovery  Baseline: unable to complete on eval; MinA Goal status: MET   LONG TERM GOALS: Target date: 07/10/23  Pt will be independent with final HEP for improved gait mechanics and balance  Baseline: to be updated Goal status: INITIAL  Patient will ambulate at least 9ft with LRAD, ModI to demonstrate improved mobility  Baseline: 34ft // bars + MinA Goal status: INITIAL  Patient will complete sit <> stand with LRAD. SBA for improved independence with transfers Baseline: MinA Goal status: REVISED  Patient will complete floor transfer without assist as initial step of fall recovery  Baseline: unable to complete on eval Goal status: INITIAL  5. Patient will don/doff prosthetics independently   Baseline: MaxA  Goal status: INITIAL  ASSESSMENT:  CLINICAL IMPRESSION: Session limited by patient increased anxiety following a fall and elevated glucose readings. PT recommends follow up with PC; see above for further details of session. Will continue per POC as able.  OBJECTIVE IMPAIRMENTS: Abnormal gait, decreased activity tolerance, decreased balance, decreased endurance, decreased knowledge of condition, decreased knowledge of use of DME, decreased strength, impaired vision/preception, and prosthetic dependency .   ACTIVITY LIMITATIONS: carrying, lifting, standing, squatting, transfers, hygiene/grooming, locomotion level, and caring for others  PARTICIPATION LIMITATIONS: meal prep, cleaning, interpersonal relationship, driving, shopping, community activity, and occupation  PERSONAL FACTORS: Age, Behavior pattern, Past/current experiences, Social background, Time since onset of injury/illness/exacerbation, Transportation, and 3+ comorbidities: see above  are also affecting patient's functional outcome.   REHAB POTENTIAL: Good  CLINICAL DECISION MAKING: Stable/uncomplicated  EVALUATION COMPLEXITY: Low  PLAN:  PT FREQUENCY: 2x/week  PT DURATION: 12 weeks  PLANNED INTERVENTIONS: 97164- PT Re-evaluation, 97110-Therapeutic exercises, 97530- Therapeutic activity, 97112- Neuromuscular re-education, 97535- Self Care, 16109- Manual therapy, [phone]- Gait training, [phone]- Prosthetic training, [phone]- Aquatic Therapy, Patient/Family education, Balance training, Stair training, Scar mobilization, Vestibular training, Visual/preceptual remediation/compensation, DME instructions, and Wheelchair mobility training  PLAN FOR NEXT SESSION: Gait, balance, floor transfers  how is modified sink HEP?, balance recovery in standing/static stability w/ dec UE reliance, walking w/ 2WW, monitor vitals, work on static balance  - reaching tasks   ASSESS GOALS/RE-CERT  Coreen Devoid, PT,  DPT 07/02/2023, 2:27 PM

## 2023-07-06 ENCOUNTER — Ambulatory Visit: Payer: 59

## 2023-07-06 ENCOUNTER — Encounter: Admitting: Occupational Therapy

## 2023-07-06 DIAGNOSIS — M6281 Muscle weakness (generalized): Secondary | ICD-10-CM | POA: Diagnosis not present

## 2023-07-06 DIAGNOSIS — R2689 Other abnormalities of gait and mobility: Secondary | ICD-10-CM

## 2023-07-06 DIAGNOSIS — R293 Abnormal posture: Secondary | ICD-10-CM

## 2023-07-06 NOTE — Therapy (Signed)
 OUTPATIENT PHYSICAL THERAPY NEURO TREATMENT/RE-CERT   Patient Name: Arti Brianna Johnson-Alston MRN: [id number] DOB:[birth year], 33 y.o., female Today's Date: 07/06/2023   PCP: Vevelyn Gowers, NP REFERRING PROVIDER: Gearldean Keepers, MD   END OF SESSION:  PT End of Session - 07/06/23 1313     Visit Number 23    Number of Visits 49   re-cert   Date for PT Re-Evaluation 40/98/11   re-cert   Authorization Type Medicare/Medicaid    Progress Note Due on Visit 30    PT Start Time 1315    PT Stop Time 1411    PT Time Calculation (min) 56 min    Equipment Utilized During Treatment Gait belt    Activity Tolerance Patient tolerated treatment well    Behavior During Therapy WFL for tasks assessed/performed             Past Medical History:  Diagnosis Date   Acute H. pylori gastric ulcer    Coffee ground emesis    Diabetes mellitus (HCC)    Diabetic gastroparesis (HCC)    DKA (diabetic ketoacidosis) (HCC) 02/24/2021   Gastroparesis    GERD (gastroesophageal reflux disease)    Hypertension    Hyperthyroidism    Intractable nausea and vomiting 04/20/2021   Normocytic anemia 04/20/2020   Prolonged Q-T interval on ECG    Past Surgical History:  Procedure Laterality Date   AMPUTATION Bilateral 10/24/2022   Procedure: BILATERAL BELOW KNEE AMPUTATION;  Surgeon: Timothy Ford, MD;  Location: [facility] OR;  Service: Orthopedics;  Laterality: Bilateral;   AMPUTATION Bilateral 10/08/2022   Procedure: BILATERAL LEG DEBRIDEMENT;  Surgeon: Timothy Ford, MD;  Location: [facility] OR;  Service: Orthopedics;  Laterality: Bilateral;   AMPUTATION Bilateral 11/14/2022   Procedure: BILATERAL ABOVE KNEE AMPUTATION;  Surgeon: Timothy Ford, MD;  Location: [facility] OR;  Service: Orthopedics;  Laterality: Bilateral;   AMPUTATION TOE Left 03/10/2018   Procedure: AMPUTATION FIFTH TOE;  Surgeon: Charity Conch, DPM;  Location: [facility];  Service: Podiatry;  Laterality: Left;   APPLICATION OF WOUND VAC  Bilateral 10/12/2022   Procedure: APPLICATION OF WOUND VAC BILATERAL LEGS;  Surgeon: Hardy Lia, MD;  Location: [facility] OR;  Service: Orthopedics;  Laterality: Bilateral;   APPLICATION OF WOUND VAC Bilateral 10/24/2022   Procedure: APPLICATION OF WOUND VAC;  Surgeon: Timothy Ford, MD;  Location: [facility] OR;  Service: Orthopedics;  Laterality: Bilateral;   BIOPSY  01/28/2020   Procedure: BIOPSY;  Surgeon: Felecia Hopper, MD;  Location: [facility];  Service: Gastroenterology;;   BOTOX  INJECTION  08/26/2021   Procedure: BOTOX  INJECTION;  Surgeon: Albertina Hugger, MD;  Location: [facility];  Service: Gastroenterology;;   ESOPHAGOGASTRODUODENOSCOPY N/A 01/28/2020   Procedure: ESOPHAGOGASTRODUODENOSCOPY (EGD);  Surgeon: Felecia Hopper, MD;  Location: [facility];  Service: Gastroenterology;  Laterality: N/A;   ESOPHAGOGASTRODUODENOSCOPY (EGD) WITH PROPOFOL  Left 09/08/2015   Procedure: ESOPHAGOGASTRODUODENOSCOPY (EGD) WITH PROPOFOL ;  Surgeon: Evangeline Hilts, MD;  Location: [facility] ENDOSCOPY;  Service: Endoscopy;  Laterality: Left;   ESOPHAGOGASTRODUODENOSCOPY (EGD) WITH PROPOFOL  N/A 08/26/2021   Procedure: ESOPHAGOGASTRODUODENOSCOPY (EGD) WITH PROPOFOL ;  Surgeon: Albertina Hugger, MD;  Location: [facility];  Service: Gastroenterology;  Laterality: N/A;   GASTRIC STIMULATOR IMPLANT SURGERY  06/2022   at [facility]   I & D EXTREMITY Bilateral 10/12/2022   Procedure: IRRIGATION AND  DEBRIDEMENT  BILATERAL LEGS;  Surgeon: Hardy Lia, MD;  Location: [facility] OR;  Service: Orthopedics;  Laterality: Bilateral;   I & D EXTREMITY Right 10/13/2022  Procedure: RIGHT THIGH WOUND VAC EXCHANGE;  Surgeon: Laneta Pintos, MD;  Location: [facility] OR;  Service: Orthopedics;  Laterality: Right;   I & D EXTREMITY Bilateral 10/17/2022   Procedure: BILATERAL THIGH AND LEG DEBRIDEMENT, PARTIAL BILATERAL FOOT AMPUTATION;  Surgeon: Timothy Ford, MD;  Location: [facility] OR;  Service: Orthopedics;  Laterality: Bilateral;   I & D EXTREMITY  Bilateral 11/05/2022   Procedure: BILATERAL THIGH DEBRIDEMENT;  Surgeon: Timothy Ford, MD;  Location: [facility] OR;  Service: Orthopedics;  Laterality: Bilateral;   I & D EXTREMITY Bilateral 11/07/2022   Procedure: BILATERAL THIGH AND LEG DEBRIDEMENT;  Surgeon: Timothy Ford, MD;  Location: [facility] OR;  Service: Orthopedics;  Laterality: Bilateral;   I & D EXTREMITY Bilateral 11/12/2022   Procedure: BILATERAL LEG DEBRIDEMENTS;  Surgeon: Timothy Ford, MD;  Location: [facility] OR;  Service: Orthopedics;  Laterality: Bilateral;   IRRIGATION AND DEBRIDEMENT FOOT Bilateral 10/05/2022   Procedure: IRRIGATION AND DEBRIDEMENT FOOT;  Surgeon: Dot Gazella, DPM;  Location: [facility]S;  Service: Orthopedics/Podiatry;  Laterality: Bilateral;   WISDOM TOOTH EXTRACTION     Patient Active Problem List   Diagnosis Date Noted   Influenza A 04/05/2023   Elevated lipase 04/05/2023   Esophagitis 03/24/2023   SIRS (systemic inflammatory response syndrome) (HCC) 03/02/2023   Fever 03/02/2023   Metabolic acidosis 03/02/2023   Hypertensive urgency 02/21/2023   Chronic pain 02/21/2023   Abscess of thigh 11/28/2022   C. 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   Hypokalemia 11/27/2022   Effusion, left knee 11/05/2022   MDD (major depressive disorder), recurrent episode, moderate (HCC) 10/16/2022   Necrotizing fasciitis of pelvic region and thigh (HCC) 10/08/2022   Necrotizing fasciitis of lower leg (HCC) 10/08/2022   Necrotizing fasciitis (HCC) 10/08/2022   Sepsis due to cellulitis (HCC) 10/03/2022   Cellulitis of lower extremity 10/03/2022   Multinodular goiter 06/18/2022   Malnutrition of moderate degree 04/18/2022   Intractable vomiting with nausea 04/17/2022   DKA (diabetic ketoacidosis) (HCC) 03/04/2022   Nausea vomiting and diarrhea 12/25/2021   Diabetic gastroparesis (HCC) 10/09/2021   Drug-seeking behavior 09/21/2021   Essential hypertension 08/25/2021   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) 08/25/2021   GERD without esophagitis 08/25/2021   Hematemesis    Prolonged Q-T interval on ECG    Nausea and vomiting 07/20/2021   Anemia of chronic disease 06/06/2021   Dehydration 06/04/2021   Hypomagnesemia 04/21/2021   Ulcerated, foot, left, with fat layer exposed (HCC) 04/20/2021   Uncontrolled type 1 diabetes mellitus with hyperglycemia, with long-term current use of insulin  (HCC) 12/18/2020   DM type 1 (diabetes mellitus, type 1) (HCC) 12/18/2020   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) 11/09/2020   Diabetic retinopathy of both eyes associated with type 1 diabetes mellitus (HCC) 09/24/2020   Diabetic gastroparesis associated with type 1 diabetes mellitus (HCC) 08/12/2020   Acute kidney injury superimposed on chronic kidney disease (HCC) 07/25/2020   Generalized abdominal pain 07/23/2020   GERD (gastroesophageal reflux disease) 07/23/2020   Diabetic nephropathy associated with type 1 diabetes mellitus (HCC) 06/27/2020   Sepsis (HCC) 05/27/2020   HLD (hyperlipidemia) 05/23/2020   Sinus tachycardia 05/09/2020   Anemia 04/20/2020   Depression with anxiety 07/05/2019   Diabetic polyneuropathy associated with type 1 diabetes mellitus (HCC) 09/24/2017   Gastroparesis    Goiter 10/05/2009   DM2 (diabetes mellitus, type 2) (HCC) 10/05/2009   Hypertension  associated with diabetes (HCC) 10/05/2009    ONSET DATE: 04/07/23 referral  REFERRING DIAG: [phone] (ICD-10-CM) - S/P AKA (above knee amputation) bilateral (HCC)   THERAPY DIAG:  Muscle weakness (generalized) - Plan: PT plan of care cert/re-cert  Other abnormalities of gait and mobility - Plan: PT plan of care cert/re-cert  Abnormal posture - Plan: PT plan of care cert/re-cert  Rationale for Evaluation and Treatment: Rehabilitation  SUBJECTIVE:                                                                                                                                                                                              SUBJECTIVE STATEMENT: Patient reports doing much better. Denies additional falls. Next prosthetics meeting 6/16.   Pt accompanied by: friend - Drew  PERTINENT HISTORY: DM, diabetic gastroparesis, GERD, HTN, hyperthyroidism  PAIN:  Are you having pain? No  VITALS: There were no vitals filed for this visit.    PRECAUTIONS: Fall; B diabetic retinopathy  PATIENT GOALS: "to be able to walk again"                                                                                                                              TREATMENT Theract -Standing balance blaze pods on mirror random color taps   -U UE support, progressed to no UE support  -CGA provided -standing U UE support on ballet bar dynamic standing balance with UE movements  -standing U UE support on ballet bar dynamic standing balance with U LE kicks  -discussed location of gastric pacer and implications on current pain and function  PATIENT EDUCATION: Education details: Continue HEP, goal assessment, PT POC Person educated: Patient and friend Education method: Explanation and Demonstration Education comprehension: verbalized understanding and needs further education  HOME EXERCISE PROGRAM: -practice donning/doffing prosthetic Access Code: [email or web address] URL: [email or web address] Date: 04/20/2023 Prepared by: Nickola Baron  Exercises - Prone Hip Extension with Residual Limb (BKA)  - 1 x daily - 7 x weekly - 3 sets - 10 reps - Sidelying Hip Abduction (AKA)  - 1 x daily -  7 x weekly - 3 sets - 10 reps - Supine Piriformis Stretch with Residual Limb (AKA)  - 1 x daily - 7 x weekly - 3 sets - 10 reps  Do each exercise 1-2  times per day Do each exercise 10 repetitions Hold each exercise for 2 seconds to feel your location  AT SINK FIND YOUR MIDLINE POSITION AND PLACE FEET EQUAL DISTANCE FROM THE MIDLINE.  Try to find this  position when standing still for activities.   USE TAPE ON FLOOR TO MARK THE MIDLINE POSITION. You also should try to feel with your limb pressure in socket.  You are trying to feel with limb what you used to feel with the bottom of your foot.  Side to Side Shift: Moving your hips only (not shoulders): move weight onto your left leg, HOLD/FEEL.  Move back to equal weight on each leg, HOLD/FEEL. Move weight onto your right leg, HOLD/FEEL. Move back to equal weight on each leg, HOLD/FEEL. Repeat.  Start with both hands on sink, progress to right hand only, then no hands.  Front to Back Shift: Moving your hips only (not shoulders): move your weight forward onto your toes, HOLD/FEEL. Move your weight back to equal Flat Foot on both legs, HOLD/FEEL. Move your weight back onto your heels, HOLD/FEEL. Move your weight back to equal on both legs, HOLD/FEEL. Repeat.   tart with both hands on sink, progress to right hand only, then no hands. Moving Cones / Cups: With equal weight on each leg: Hold on with one hand the first time, then progress to no hand supports. Move cups from one side of sink to the other. Place cups ~2" out of your reach, progress to 10" beyond reach.  Place one hand in middle of sink and reach with other hand. Do both arms.  Then hover one hand and move cups with other hand. -just use a crossbody reach to end of safe weight shift ROM Overhead/Upward Reaching: alternated reaching up to top cabinets or ceiling if no cabinets present. Keep equal weight on each leg. Start with one hand support on counter while other hand reaches and progress to no hand support with reaching.  ace one hand in middle of sink and reach with other hand. Do both arms.  Then hover one hand and move cups with other hand.  5.   Looking Over Shoulders: With equal weight on each leg: alternate turning to look over your shoulders with one hand support on counter as needed.  Start with head motions only to look in front of  shoulder, then even with shoulder and progress to looking behind you. To look to side, move head /eyes, then shoulder on side looking pulls back, shift more weight to side looking and pull hip back. ace one hand in middle of sink and let go with other hand so your shoulder can pull back. Switch hands to look other way.   Then hover one hand and move cups with other hand.  6.  Stepping with leg that is not amputated:  Move items under cabinet out of your way. Shift your hips/pelvis so weight on prosthesis. SLOWLY step other leg so front of foot is in cabinet. Then step back to floor. - just hike the hip and place the foot back in the starting spot  GOALS: Goals reviewed with patient? Yes  SHORT TERM GOALS: Target date: 05/15/23  Pt will be independent with initial HEP for improved gait mechanics and balance  Baseline:  to be updated, reports she has not been doing at home doesn't trust CNA Goal status: NOT MET  2.  Patient will ambulate at least 36ft with LRAD and no more than MinA to demonstrate improved mobility  Baseline: 24ft // bars + MinA, 10 feet with 2WW + minA Goal status: NOT met  3.  Patient will complete sit <> stand with LRAD and no more than CGA for improved independence with transfers Baseline: MinA, required minA in today's session  Goal status: NOT met - required minA due to posterior instability  4.  Patient will complete floor transfer with assist as initial step of fall recovery  Baseline: unable to complete on eval; MinA Goal status: MET   LONG TERM GOALS: Target date: 07/10/23  Pt will be independent with final HEP for improved gait mechanics and balance  Baseline: to be updated; provided Goal status: MET  Patient will ambulate at least 53ft with LRAD, ModI to demonstrate improved mobility  Baseline: 25ft // bars + MinA; 115' with supervision  Goal status: NOT MET  Patient will complete sit <> stand with LRAD. SBA for improved independence with  transfers Baseline: MinA; CGA Goal status: NOT MET  Patient will complete floor transfer without assist as initial step of fall recovery  Baseline: unable to complete on eval; MinA Goal status: NOT MET  5. Patient will don/doff prosthetics independently   Baseline: MaxA, independent   Goal status: MET   NEW SHORT TERM GOALS: 08/07/23 1.Pt will be independent with new initial HEP for improved gait mechanics and balance  Baseline: to be updated; provided Goal status: NEW  2. Patient will ambulate at least 158ft with LRAD, supervision to demonstrate improved mobility  Baseline: 9ft // bars + MinA; 115' with supervision  Goal status: NEW  3.Patient will complete sit <> stand with LRAD ModI for improved independence with transfers Baseline: CGA Goal status: NEW  4. Patient will complete floor transfer without assist as initial step of fall recovery  Baseline:  MinA Goal status: NEW  NEW LONG TERM GOALS: 09/28/23  1. Pt will be independent with final HEP for improved functional strength and mobility  Baseline: to be updated Goal status: NEW  2. Patient will ambulate 273ft using LRAD and supervision for improved limited community access Baseline: 115' with supervision/CGA + RW Goal status: NEW  3. Patient will complete standing dynamic task ModI for at least 5 mins for greater independence with ADLs Baseline: ~41mins with CGA Goal status: NEW     ASSESSMENT:  CLINICAL IMPRESSION: Patient seen for skilled PT session with emphasis on re-cert and dynamic standing balance. She is progressing very well with both gait and standing balance. She is limited by her vision and requires alternative multimodal cues for donning/doffing prosthetics as well as safety. She is progressing in her ability to maintain her balance completing simple dynamic tasks, but would benefit from further practice regaining a LOB independently. She will continue to benefit from skilled PT services.    OBJECTIVE IMPAIRMENTS: Abnormal gait, decreased activity tolerance, decreased balance, decreased endurance, decreased knowledge of condition, decreased knowledge of use of DME, decreased strength, impaired vision/preception, and prosthetic dependency .   ACTIVITY LIMITATIONS: carrying, lifting, standing, squatting, transfers, hygiene/grooming, locomotion level, and caring for others  PARTICIPATION LIMITATIONS: meal prep, cleaning, interpersonal relationship, driving, shopping, community activity, and occupation  PERSONAL FACTORS: Age, Behavior pattern, Past/current experiences, Social background, Time since onset of injury/illness/exacerbation, Transportation, and 3+ comorbidities: see above are also affecting patient's functional  outcome.   REHAB POTENTIAL: Good  CLINICAL DECISION MAKING: Stable/uncomplicated  EVALUATION COMPLEXITY: Low  PLAN:  PT FREQUENCY: 2x/week  PT DURATION: 12 weeks  PLANNED INTERVENTIONS: 97164- PT Re-evaluation, 97110-Therapeutic exercises, 97530- Therapeutic activity, 97112- Neuromuscular re-education, 97535- Self Care, 21308- Manual therapy, [phone]- Gait training, [phone]- Prosthetic training, [phone]- Aquatic Therapy, Patient/Family education, Balance training, Stair training, Scar mobilization, Vestibular training, Visual/preceptual remediation/compensation, DME instructions, and Wheelchair mobility training  PLAN FOR NEXT SESSION: Gait, balance, floor transfers  how is modified sink HEP?, balance recovery in standing/static stability w/ dec UE reliance, walking, dynamic/standing balance, balance recovery   Rebecca Campus, PT Rebecca Campus, PT, DPT, CBIS  07/06/2023, 2:28 PM

## 2023-07-08 ENCOUNTER — Emergency Department (HOSPITAL_COMMUNITY)
Admission: EM | Admit: 2023-07-08 | Discharge: 2023-07-08 | Disposition: A | Discharge status: Home / Self Care | Attending: Emergency Medicine | Admitting: Emergency Medicine

## 2023-07-08 ENCOUNTER — Other Ambulatory Visit: Payer: Self-pay

## 2023-07-08 DIAGNOSIS — T82847A Pain from cardiac prosthetic devices, implants and grafts, initial encounter: Secondary | ICD-10-CM | POA: Insufficient documentation

## 2023-07-08 DIAGNOSIS — Z794 Long term (current) use of insulin: Secondary | ICD-10-CM | POA: Diagnosis not present

## 2023-07-08 DIAGNOSIS — Z79899 Other long term (current) drug therapy: Secondary | ICD-10-CM | POA: Insufficient documentation

## 2023-07-08 DIAGNOSIS — Z950 Presence of cardiac pacemaker: Secondary | ICD-10-CM | POA: Insufficient documentation

## 2023-07-08 DIAGNOSIS — I10 Essential (primary) hypertension: Secondary | ICD-10-CM | POA: Insufficient documentation

## 2023-07-08 DIAGNOSIS — E119 Type 2 diabetes mellitus without complications: Secondary | ICD-10-CM | POA: Insufficient documentation

## 2023-07-08 LAB — CBC WITH DIFFERENTIAL/PLATELET
Abs Immature Granulocytes: 0.01 10*3/uL (ref 0.00–0.07)
Basophils Absolute: 0.1 10*3/uL (ref 0.0–0.1)
Basophils Relative: 1 %
Eosinophils Absolute: 0.2 10*3/uL (ref 0.0–0.5)
Eosinophils Relative: 2 %
HCT: 37.2 % (ref 36.0–46.0)
Hemoglobin: 11.2 g/dL — ABNORMAL LOW (ref 12.0–15.0)
Immature Granulocytes: 0 %
Lymphocytes Relative: 27 %
Lymphs Abs: 2.2 10*3/uL (ref 0.7–4.0)
MCH: 28.9 pg (ref 26.0–34.0)
MCHC: 30.1 g/dL (ref 30.0–36.0)
MCV: 95.9 fL (ref 80.0–100.0)
Monocytes Absolute: 0.6 10*3/uL (ref 0.1–1.0)
Monocytes Relative: 7 %
Neutro Abs: 5 10*3/uL (ref 1.7–7.7)
Neutrophils Relative %: 63 %
Platelets: 370 10*3/uL (ref 150–400)
RBC: 3.88 MIL/uL (ref 3.87–5.11)
RDW: 13.9 % (ref 11.5–15.5)
WBC: 7.9 10*3/uL (ref 4.0–10.5)
nRBC: 0 % (ref 0.0–0.2)

## 2023-07-08 LAB — COMPREHENSIVE METABOLIC PANEL WITH GFR
ALT: 18 U/L (ref 0–44)
AST: 18 U/L (ref 15–41)
Albumin: <1.5 g/dL — ABNORMAL LOW (ref 3.5–5.0)
Alkaline Phosphatase: 72 U/L (ref 38–126)
Anion gap: 9 (ref 5–15)
BUN: 36 mg/dL — ABNORMAL HIGH (ref 6–20)
CO2: 18 mmol/L — ABNORMAL LOW (ref 22–32)
Calcium: 8.5 mg/dL — ABNORMAL LOW (ref 8.9–10.3)
Chloride: 108 mmol/L (ref 98–111)
Creatinine, Ser: 2.44 mg/dL — ABNORMAL HIGH (ref 0.44–1.00)
GFR, Estimated: 26 mL/min — ABNORMAL LOW (ref >60)
Glucose, Bld: 293 mg/dL — ABNORMAL HIGH (ref 70–99)
Potassium: 4.7 mmol/L (ref 3.5–5.1)
Sodium: 135 mmol/L (ref 135–145)
Total Bilirubin: 0.5 mg/dL (ref 0.0–1.2)
Total Protein: 5.1 g/dL — ABNORMAL LOW (ref 6.5–8.1)

## 2023-07-08 LAB — URINALYSIS, ROUTINE W REFLEX MICROSCOPIC
Bilirubin Urine: NEGATIVE
Glucose, UA: >=500 mg/dL — AB
Hgb urine dipstick: NEGATIVE
Ketones, ur: NEGATIVE mg/dL
Leukocytes,Ua: NEGATIVE
Nitrite: NEGATIVE
Protein, ur: >=300 mg/dL — AB
Specific Gravity, Urine: 1.011 (ref 1.005–1.030)
pH: 6 (ref 5.0–8.0)

## 2023-07-08 LAB — CBG MONITORING, ED: Glucose-Capillary: 247 mg/dL — ABNORMAL HIGH (ref 70–99)

## 2023-07-08 LAB — LACTIC ACID, PLASMA
Lactic Acid, Venous: 0.8 mmol/L (ref 0.5–1.9)
Lactic Acid, Venous: 0.9 mmol/L (ref 0.5–1.9)

## 2023-07-08 MED ORDER — LACTATED RINGERS IV BOLUS
1000.0000 mL | Freq: Once | INTRAVENOUS | Status: AC
  Administered 2023-07-08: 1000 mL via INTRAVENOUS

## 2023-07-08 MED ORDER — INSULIN ASPART 100 UNIT/ML IJ SOLN
5.0000 [IU] | Freq: Once | INTRAMUSCULAR | Status: AC
  Administered 2023-07-08: 5 [IU] via SUBCUTANEOUS

## 2023-07-08 MED ORDER — OXYCODONE HCL 5 MG PO TABS: 5.0000 mg | ORAL_TABLET | ORAL | 0 refills | Status: AC | PRN

## 2023-07-08 MED ORDER — FENTANYL CITRATE PF 50 MCG/ML IJ SOSY
50.0000 ug | PREFILLED_SYRINGE | Freq: Once | INTRAMUSCULAR | Status: AC
  Administered 2023-07-08: 50 ug via INTRAVENOUS
  Filled 2023-07-08 (×2): qty 1

## 2023-07-08 MED ORDER — HYDROMORPHONE HCL 1 MG/ML IJ SOLN
0.5000 mg | Freq: Once | INTRAMUSCULAR | Status: AC
  Administered 2023-07-08: 0.5 mg via INTRAVENOUS
  Filled 2023-07-08: qty 1

## 2023-07-08 MED ORDER — AMOXICILLIN-POT CLAVULANATE 875-125 MG PO TABS: 1.0000 | ORAL_TABLET | Freq: Two times a day (BID) | ORAL | 0 refills | Status: AC

## 2023-07-08 NOTE — ED Provider Notes (Addendum)
 [place] [facility]ENC[facility]MENT AT [facility] Provider Note   CSN: [id number] Arrival date & time: 07/08/23  1357     History  No chief complaint on file.   Kelli Hudson is a 33 y.o. female past medical history significant for diabetes, gastroparesis, bilateral AKA, hypertension, GERD, and gastric stimulator implant surgery presents today for possible pacemaker displacement.  Patient reports pain and skin ripping open at pacemaker site.  Patient denies fever, chills, nausea, vomiting, chest pain, or shortness of breath.  HPI     Home Medications Prior to Admission medications   Medication Sig Start Date End Date Taking? Authorizing Provider  acetaminophen  (TYLENOL ) 500 MG tablet Take 1 tablet (500 mg total) by mouth every 6 (six) hours as needed. 04/04/23   Debbra Fairy, PA-C  amLODipine  (NORVASC ) 10 MG tablet Take 1 tablet (10 mg total) by mouth daily. 12/04/22   Elgergawy, Ardia Kraft, MD  ascorbic acid  (VITAMIN C ) 1000 MG tablet Take 1 tablet (1,000 mg total) by mouth daily. Patient taking differently: Take 1,000 mg by mouth once a week. 11/22/22   Haydee Lipa, MD  benzonatate  (TESSALON ) 100 MG capsule Take 1 capsule (100 mg total) by mouth every 8 (eight) hours. 04/04/23   Debbra Fairy, PA-C  carvedilol  (COREG ) 6.25 MG tablet Take 1 tablet (6.25 mg total) by mouth 2 (two) times daily with a meal. 12/03/22   Elgergawy, Ardia Kraft, MD  Continuous Glucose Transmitter (DEXCOM G6 TRANSMITTER) MISC 1 Device by Does not apply route as directed. 06/18/22   Shamleffer, Ibtehal Jaralla, MD  etonogestrel  (NEXPLANON ) 68 MG IMPL implant 1 each by Subdermal route continuous.    [provider]  famotidine  (PEPCID ) 20 MG tablet Take 1 tablet (20 mg total) by mouth 2 (two) times daily. 12/03/22   Elgergawy, Ardia Kraft, MD  gabapentin  (NEURONTIN ) 300 MG capsule Take 1 capsule (300 mg total) by mouth every 8 (eight) hours. 11/22/22   Haydee Lipa, MD   hydrOXYzine  (ATARAX ) 10 MG tablet Take 1 tablet (10 mg total) by mouth 3 (three) times daily as needed for anxiety. 03/07/23   Krishnan, Gokul, MD  insulin  aspart (NOVOLOG ) 100 UNIT/ML injection Inject 0-9 Units into the skin 3 (three) times daily with meals. Patient taking differently: Inject 10-30 Units into the skin 3 (three) times daily with meals. 12/03/22   Elgergawy, Ardia Kraft, MD  insulin  degludec (TRESIBA  FLEXTOUCH) 200 UNIT/ML FlexTouch Pen Inject 15 Units into the skin 2 (two) times daily. If CBG is > 150    [provider]  Insulin  Pen Needle 32G X 4 MM MISC 1 Device by Does not apply route in the morning, at noon, in the evening, and at bedtime. 06/18/22   Shamleffer, Julian Obey, MD  MAGNESIUM  PO Take 1 tablet by mouth daily.    [provider]  methocarbamol  (ROBAXIN ) 500 MG tablet Take 500 mg by mouth 3 (three) times daily as needed for muscle spasms. 11/22/22   [provider]  metoCLOPramide  (REGLAN ) 10 MG tablet Take 1 tablet (10 mg total) by mouth every 6 (six) hours. 03/23/23   Nathanael Baker, DO  Multiple Vitamin (MULTIVITAMIN WITH MINERALS) TABS tablet Take 1 tablet by mouth daily. 11/22/22   Haydee Lipa, MD  oxyCODONE  (OXY IR/ROXICODONE ) 5 MG immediate release tablet Take 1 tablet (5 mg total) by mouth every 6 (six) hours as needed for severe pain (pain score 7-10). Patient not taking: Reported on 04/05/2023 03/07/23   Krishnan, Gokul,  MD  potassium chloride  SA (KLOR-CON  M) 20 MEQ tablet Take 20 mEq by mouth daily.    [provider]  spironolactone (ALDACTONE) 25 MG tablet Take 25 mg by mouth 2 (two) times daily.    [provider]  traZODone  (DESYREL ) 150 MG tablet Take 150 mg by mouth at bedtime. 01/14/23   [provider]      Allergies    Ibuprofen  and Lisinopril     Review of Systems   Review of Systems  Gastrointestinal:  Positive for abdominal pain.    Physical Exam Updated Vital Signs BP (!)  163/111   Pulse (!) 101   Temp 98 F (36.7 C) (Oral)   Resp 18   Ht 5' (1.524 m)   Wt 54.4 kg   SpO2 99%   BMI 23.44 kg/m  Physical Exam Vitals and nursing note reviewed.  Constitutional:      General: She is not in acute distress.    Appearance: She is well-developed. She is not ill-appearing, toxic-appearing or diaphoretic.     Comments: Patient tearful on exam  HENT:     Head: Normocephalic and atraumatic.     Right Ear: External ear normal.     Left Ear: External ear normal.     Nose: Nose normal.     Mouth/Throat:     Mouth: Mucous membranes are moist.  Eyes:     Conjunctiva/sclera: Conjunctivae normal.  Cardiovascular:     Rate and Rhythm: Normal rate and regular rhythm.     Pulses: Normal pulses.  Pulmonary:     Effort: Pulmonary effort is normal. No respiratory distress.     Breath sounds: Normal breath sounds.  Abdominal:     Palpations: Abdomen is soft.     Tenderness: There is no abdominal tenderness.       Comments: Patient with part of gastric pacemaker visualized through tan skin.  No purulent discharge or surrounding erythema or warmth noted on exam.  Musculoskeletal:        General: No swelling.     Cervical back: Neck supple.  Skin:    General: Skin is warm and dry.     Capillary Refill: Capillary refill takes less than 2 seconds.  Neurological:     General: No focal deficit present.     Mental Status: She is alert and oriented to person, place, and time.  Psychiatric:        Mood and Affect: Mood normal.     ED Results / Procedures / Treatments   Labs (all labs ordered are listed, but only abnormal results are displayed) Labs Reviewed  COMPREHENSIVE METABOLIC PANEL WITH GFR  CBC WITH DIFFERENTIAL/PLATELET  URINALYSIS, ROUTINE W REFLEX MICROSCOPIC  LACTIC ACID, PLASMA  LACTIC ACID, PLASMA    EKG None  Radiology No results found.  Procedures Procedures    Medications Ordered in ED Medications  fentaNYL  (SUBLIMAZE ) injection 50  mcg (has no administration in time range)    ED Course/ Medical Decision Making/ A&P                                 Medical Decision Making Amount and/or Complexity of Data Reviewed Labs: ordered.  Risk Prescription drug management.   This patient presents to the ED for concern of medical device issue, this involves an extensive number of treatment options, and is a complaint that carries with it a high risk of complications and morbidity.  The differential diagnosis includes sepsis, abscess, cellulitis, gastric pacemaker displacement   Co morbidities that complicate the patient evaluation  Diabetes, hypertension, gastroparesis   Additional history obtained:  External records from outside source obtained and reviewed including [facility] gastroenterology notes Gastric pacemaker placed on 06/25/2022 by Dr. Dora Gallo   Lab Tests:  I Ordered, and personally interpreted labs.  The pertinent results include:  PENDING   Problem List / ED Course / Critical interventions / Medication management I ordered medication including morphine  for pain Reevaluation of the patient after these medicines showed that the patient improved I have reviewed the patients home medicines and have made adjustments as needed   Consultations Obtained:  I requested consultation with the [facility],  and discussed lab and imaging findings as well as pertinent plan - they recommend: Dry dressing and suppressive antibiotics, patient will follow-up in clinic with Dr. Tama Fails tomorrow.    Final Clinical Impression(s) / ED Diagnoses Final diagnoses:  None    Rx / DC Orders ED Discharge Orders     None         Merryl Abraham 07/08/23 1502    Almond Army, MD 07/08/23 1520    Carie Charity, PA-C 07/08/23 1530    Almond Army, MD 07/08/23 1531

## 2023-07-08 NOTE — Discharge Instructions (Addendum)
 Please follow-up with your surgeon tomorrow in clinic.  Looks like you have spoken with the office as well and were given a time of 230.  In the meantime I sent some pain medicine into the pharmacy along with antibiotic.  Return for any emergent symptoms. Your kidney function was elevated.  You received some IV fluids.  Continue to drink fluids at home.  This will need to be rechecked within 72 hours to ensure that it is improving.  Your surgeon could do this or you need to follow-up with your primary care doctor.

## 2023-07-08 NOTE — ED Notes (Signed)
 Pt mother stated that her dexcom was alerting that she needed some insulin , POC CBG is 247. Provider messaged per families request.

## 2023-07-08 NOTE — ED Provider Notes (Signed)
 Patient at the end of my shift waiting for blood work.  Previous provider spoke to the surgeon at [facility] who recommended patient to be seen tomorrow in clinic.  Also recommended Augmentin  in the meantime.  I will send in pain control as well.  Physical Exam  BP (!) 163/111   Pulse (!) 101   Temp 98 F (36.7 C) (Oral)   Resp 18   Ht 5' (1.524 m)   Wt 54.4 kg   SpO2 99%   BMI 23.44 kg/m     Procedures  Procedures  ED Course / MDM    Medical Decision Making Amount and/or Complexity of Data Reviewed Labs: ordered.  Risk Prescription drug management.   CBC without leukocytosis or significant anemia.  CMP does show creatinine of 2.44 which is up from her baseline of about 1.5.  Per previous provider the surgeon recommended Augmentin  as antibiotic of choice.  Will prescribe this. Will give fluids today and she can have these repeated by PCP or the surgeon to ensure her kidney function is improving. Patient and mom are both in agreement with this. Discussed with attending.        Lucina Sabal, PA-C 07/08/23 1804    Mozell Arias, MD 07/08/23 2312

## 2023-07-08 NOTE — ED Triage Notes (Signed)
 According to [facility]:Pt found to have L sided gastric pacemaker that has been hurting intensifying this last week. This am it was discovered to be poking out the skin. It appears to be oozing puss and blood.  Vitals: 150/100 Hr 100 Spo2 100  HX of blindness, diabetes and bilateral above the knee amputee.

## 2023-07-08 NOTE — ED Notes (Signed)
 Called [facility] transfer line [phone], supplied patient information and Requesting Provider: Judie Noun PA.  Operator stated she would page the transfer request and someone should be calling PA Sylvester Evert soon.

## 2023-07-21 ENCOUNTER — Encounter: Payer: Self-pay | Admitting: Physical Therapy

## 2023-07-21 ENCOUNTER — Ambulatory Visit: Attending: Orthopedic Surgery | Admitting: Physical Therapy

## 2023-07-21 VITALS — BP 100/58 | HR 95

## 2023-07-21 DIAGNOSIS — R293 Abnormal posture: Secondary | ICD-10-CM

## 2023-07-21 DIAGNOSIS — R29898 Other symptoms and signs involving the musculoskeletal system: Secondary | ICD-10-CM | POA: Diagnosis present

## 2023-07-21 DIAGNOSIS — R2689 Other abnormalities of gait and mobility: Secondary | ICD-10-CM | POA: Diagnosis present

## 2023-07-21 DIAGNOSIS — M6281 Muscle weakness (generalized): Secondary | ICD-10-CM

## 2023-07-21 NOTE — Therapy (Signed)
 OUTPATIENT PHYSICAL THERAPY NEURO TREATMENT   Patient Name: Kelli Hudson MRN: [id number] DOB:[birth year], 33 y.o., female Today's Date: 07/21/2023   PCP: Vevelyn Gowers, NP REFERRING PROVIDER: Gearldean Keepers, MD   END OF SESSION:  PT End of Session - 07/21/23 1331     Visit Number 24    Number of Visits 49   re-cert   Date for PT Re-Evaluation 82/95/62   re-cert   Authorization Type Medicare/Medicaid    Progress Note Due on Visit 30    PT Start Time 1316    PT Stop Time 1358    PT Time Calculation (min) 42 min    Equipment Utilized During Treatment Gait belt    Activity Tolerance Patient tolerated treatment well;Other (comment)   recent medical episode/procedure   Behavior During Therapy WFL for tasks assessed/performed             Past Medical History:  Diagnosis Date   Acute H. pylori gastric ulcer    Coffee ground emesis    Diabetes mellitus (HCC)    Diabetic gastroparesis (HCC)    DKA (diabetic ketoacidosis) (HCC) 02/24/2021   Gastroparesis    GERD (gastroesophageal reflux disease)    Hypertension    Hyperthyroidism    Intractable nausea and vomiting 04/20/2021   Normocytic anemia 04/20/2020   Prolonged Q-T interval on ECG    Past Surgical History:  Procedure Laterality Date   AMPUTATION Bilateral 10/24/2022   Procedure: BILATERAL BELOW KNEE AMPUTATION;  Surgeon: Timothy Ford, MD;  Location: [facility] OR;  Service: Orthopedics;  Laterality: Bilateral;   AMPUTATION Bilateral 10/08/2022   Procedure: BILATERAL LEG DEBRIDEMENT;  Surgeon: Timothy Ford, MD;  Location: [facility] OR;  Service: Orthopedics;  Laterality: Bilateral;   AMPUTATION Bilateral 11/14/2022   Procedure: BILATERAL ABOVE KNEE AMPUTATION;  Surgeon: Timothy Ford, MD;  Location: [facility] OR;  Service: Orthopedics;  Laterality: Bilateral;   AMPUTATION TOE Left 03/10/2018   Procedure: AMPUTATION FIFTH TOE;  Surgeon: Charity Conch, DPM;  Location: [facility];  Service: Podiatry;   Laterality: Left;   APPLICATION OF WOUND VAC Bilateral 10/12/2022   Procedure: APPLICATION OF WOUND VAC BILATERAL LEGS;  Surgeon: Hardy Lia, MD;  Location: [facility] OR;  Service: Orthopedics;  Laterality: Bilateral;   APPLICATION OF WOUND VAC Bilateral 10/24/2022   Procedure: APPLICATION OF WOUND VAC;  Surgeon: Timothy Ford, MD;  Location: [facility] OR;  Service: Orthopedics;  Laterality: Bilateral;   BIOPSY  01/28/2020   Procedure: BIOPSY;  Surgeon: Felecia Hopper, MD;  Location: [facility];  Service: Gastroenterology;;   BOTOX  INJECTION  08/26/2021   Procedure: BOTOX  INJECTION;  Surgeon: Albertina Hugger, MD;  Location: [facility];  Service: Gastroenterology;;   ESOPHAGOGASTRODUODENOSCOPY N/A 01/28/2020   Procedure: ESOPHAGOGASTRODUODENOSCOPY (EGD);  Surgeon: Felecia Hopper, MD;  Location: [facility];  Service: Gastroenterology;  Laterality: N/A;   ESOPHAGOGASTRODUODENOSCOPY (EGD) WITH PROPOFOL  Left 09/08/2015   Procedure: ESOPHAGOGASTRODUODENOSCOPY (EGD) WITH PROPOFOL ;  Surgeon: Evangeline Hilts, MD;  Location: [facility] ENDOSCOPY;  Service: Endoscopy;  Laterality: Left;   ESOPHAGOGASTRODUODENOSCOPY (EGD) WITH PROPOFOL  N/A 08/26/2021   Procedure: ESOPHAGOGASTRODUODENOSCOPY (EGD) WITH PROPOFOL ;  Surgeon: Albertina Hugger, MD;  Location: [facility];  Service: Gastroenterology;  Laterality: N/A;   GASTRIC STIMULATOR IMPLANT SURGERY  06/2022   at [facility]   I & D EXTREMITY Bilateral 10/12/2022   Procedure: IRRIGATION AND  DEBRIDEMENT  BILATERAL LEGS;  Surgeon: Hardy Lia, MD;  Location: [facility] OR;  Service: Orthopedics;  Laterality: Bilateral;   I &  D EXTREMITY Right 10/13/2022   Procedure: RIGHT THIGH WOUND VAC EXCHANGE;  Surgeon: Laneta Pintos, MD;  Location: [facility] OR;  Service: Orthopedics;  Laterality: Right;   I & D EXTREMITY Bilateral 10/17/2022   Procedure: BILATERAL THIGH AND LEG DEBRIDEMENT, PARTIAL BILATERAL FOOT AMPUTATION;  Surgeon: Timothy Ford, MD;  Location: [facility] OR;  Service: Orthopedics;   Laterality: Bilateral;   I & D EXTREMITY Bilateral 11/05/2022   Procedure: BILATERAL THIGH DEBRIDEMENT;  Surgeon: Timothy Ford, MD;  Location: [facility] OR;  Service: Orthopedics;  Laterality: Bilateral;   I & D EXTREMITY Bilateral 11/07/2022   Procedure: BILATERAL THIGH AND LEG DEBRIDEMENT;  Surgeon: Timothy Ford, MD;  Location: [facility] OR;  Service: Orthopedics;  Laterality: Bilateral;   I & D EXTREMITY Bilateral 11/12/2022   Procedure: BILATERAL LEG DEBRIDEMENTS;  Surgeon: Timothy Ford, MD;  Location: [facility] OR;  Service: Orthopedics;  Laterality: Bilateral;   IRRIGATION AND DEBRIDEMENT FOOT Bilateral 10/05/2022   Procedure: IRRIGATION AND DEBRIDEMENT FOOT;  Surgeon: Dot Gazella, DPM;  Location: [facility]S;  Service: Orthopedics/Podiatry;  Laterality: Bilateral;   WISDOM TOOTH EXTRACTION     Patient Active Problem List   Diagnosis Date Noted   Influenza A 04/05/2023   Elevated lipase 04/05/2023   Esophagitis 03/24/2023   SIRS (systemic inflammatory response syndrome) (HCC) 03/02/2023   Fever 03/02/2023   Metabolic acidosis 03/02/2023   Hypertensive urgency 02/21/2023   Chronic pain 02/21/2023   Abscess of thigh 11/28/2022   C. 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   Hypokalemia 11/27/2022   Effusion, left knee 11/05/2022   MDD (major depressive disorder), recurrent episode, moderate (HCC) 10/16/2022   Necrotizing fasciitis of pelvic region and thigh (HCC) 10/08/2022   Necrotizing fasciitis of lower leg (HCC) 10/08/2022   Necrotizing fasciitis (HCC) 10/08/2022   Sepsis due to cellulitis (HCC) 10/03/2022   Cellulitis of lower extremity 10/03/2022   Multinodular goiter 06/18/2022   Malnutrition of moderate degree 04/18/2022   Intractable vomiting with nausea 04/17/2022   DKA (diabetic ketoacidosis) (HCC) 03/04/2022   Nausea vomiting and diarrhea 12/25/2021   Diabetic gastroparesis (HCC) 10/09/2021   Drug-seeking behavior 09/21/2021    Essential hypertension 08/25/2021   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) 08/25/2021   GERD without esophagitis 08/25/2021   Hematemesis    Prolonged Q-T interval on ECG    Nausea and vomiting 07/20/2021   Anemia of chronic disease 06/06/2021   Dehydration 06/04/2021   Hypomagnesemia 04/21/2021   Ulcerated, foot, left, with fat layer exposed (HCC) 04/20/2021   Uncontrolled type 1 diabetes mellitus with hyperglycemia, with long-term current use of insulin  (HCC) 12/18/2020   DM type 1 (diabetes mellitus, type 1) (HCC) 12/18/2020   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) 11/09/2020   Diabetic retinopathy of both eyes associated with type 1 diabetes mellitus (HCC) 09/24/2020   Diabetic gastroparesis associated with type 1 diabetes mellitus (HCC) 08/12/2020   Acute kidney injury superimposed on chronic kidney disease (HCC) 07/25/2020   Generalized abdominal pain 07/23/2020   GERD (gastroesophageal reflux disease) 07/23/2020   Diabetic nephropathy associated with type 1 diabetes mellitus (HCC) 06/27/2020   Sepsis (HCC) 05/27/2020   HLD (hyperlipidemia) 05/23/2020   Sinus tachycardia 05/09/2020   Anemia 04/20/2020   Depression with anxiety 07/05/2019   Diabetic polyneuropathy associated with type 1 diabetes mellitus (HCC) 09/24/2017   Gastroparesis    Goiter 10/05/2009   DM2 (diabetes mellitus, type  2) (HCC) 10/05/2009   Hypertension associated with diabetes (HCC) 10/05/2009    ONSET DATE: 04/07/23 referral  REFERRING DIAG: [phone] (ICD-10-CM) - S/P AKA (above knee amputation) bilateral (HCC)   THERAPY DIAG:  Muscle weakness (generalized)  Other abnormalities of gait and mobility  Abnormal posture  Other symptoms and signs involving the musculoskeletal system  Rationale for Evaluation and Treatment: Rehabilitation  SUBJECTIVE:                                                                                                                                                                                              SUBJECTIVE STATEMENT: Patient presents to clinic following recent observational hospitalization following gastric pacer removal due to lateral abdominal perforation.  She will follow-up with [facility] in 2 weeks regarding gastric pacer. Denies additional falls. Next prosthetics appt 6/16.  She is tired today.  Pt accompanied by: friend - Drew  PERTINENT HISTORY: DM, diabetic gastroparesis, GERD, HTN, hyperthyroidism  PAIN:  Are you having pain? No  VITALS: Vitals:   07/21/23 1323  BP: (!) 100/58  Pulse: 95      PRECAUTIONS: Fall; B diabetic retinopathy  PATIENT GOALS: "to be able to walk again"                                                                                                                              TREATMENT Theract RUE vitals prior to session: Today's Vitals   07/21/23 1323  BP: (!) 100/58  Pulse: 95   Wound assessment:    -small laparoscopic incision on right lower flank that appears clean, closed/well approximated and dry  -medial belly button incision appears closed w/ mild scabbing over midline where likely some friction exists with general mobility, no discoloration or signs of overt irritation -lateral incision just superior to original erosion wound appears closed, stitches intact, well approximated, no overt scabbing or signs of infection -Original erosion wound covered in dressing w/ mild drainage visible through backing of bandage, pt and partner confirm they are following dressing change schedule  Wound precautions:  re-iterated  what is noted in surgical note, pt and caregiver confirm understanding, they do have question about if her wound is deeply packed and if they should be probing the wound to get this out vs if the "lump" they feel is localized tissue swelling.  PT cautions probing due to risk for infection, but will follow-up with surgical team about  this on their behalf, they were also encouraged to follow-up to increase likelihood of obtaining a timely response.  Edu on s/s of infection requiring urgent follow-up.  Discouraged scratching over or close to wound and ways to gently manage itching without irritating wound areas.   Edu on activity "precautions" - all PT is able to see in medical note is "no strenuous activity".  Due to pt having sensitivity with arm movement on left side PT cautions excess standing/walking except for transferring until medical team can clarify.  Discussed possible benefits of abdominal binder - PT would like to inquire about safety of this with medical team before having pt try to obtain one.    PT called (w/ pt and caregiver present and with pt permission) and LVM w/ [facility] (attempted call x2) regarding precaution clarifications, wound packing clarification, and benefit of abdominal binder for pt comfort with mobility.  PATIENT EDUCATION: Education details: See above. Person educated: Patient and friend Education method: Medical illustrator Education comprehension: verbalized understanding and needs further education  HOME EXERCISE PROGRAM: -practice donning/doffing prosthetic Access Code: [email or web address] URL: [email or web address] Date: 04/20/2023 Prepared by: Nickola Baron  Exercises - Prone Hip Extension with Residual Limb (BKA)  - 1 x daily - 7 x weekly - 3 sets - 10 reps - Sidelying Hip Abduction (AKA)  - 1 x daily - 7 x weekly - 3 sets - 10 reps - Supine Piriformis Stretch with Residual Limb (AKA)  - 1 x daily - 7 x weekly - 3 sets - 10 reps  Do each exercise 1-2  times per day Do each exercise 10 repetitions Hold each exercise for 2 seconds to feel your location  AT SINK FIND YOUR MIDLINE POSITION AND PLACE FEET EQUAL DISTANCE FROM THE MIDLINE.  Try to find this position when standing still for activities.   USE TAPE ON FLOOR TO MARK THE MIDLINE POSITION. You also should try  to feel with your limb pressure in socket.  You are trying to feel with limb what you used to feel with the bottom of your foot.  Side to Side Shift: Moving your hips only (not shoulders): move weight onto your left leg, HOLD/FEEL.  Move back to equal weight on each leg, HOLD/FEEL. Move weight onto your right leg, HOLD/FEEL. Move back to equal weight on each leg, HOLD/FEEL. Repeat.  Start with both hands on sink, progress to right hand only, then no hands.  Front to Back Shift: Moving your hips only (not shoulders): move your weight forward onto your toes, HOLD/FEEL. Move your weight back to equal Flat Foot on both legs, HOLD/FEEL. Move your weight back onto your heels, HOLD/FEEL. Move your weight back to equal on both legs, HOLD/FEEL. Repeat.   tart with both hands on sink, progress to right hand only, then no hands. Moving Cones / Cups: With equal weight on each leg: Hold on with one hand the first time, then progress to no hand supports. Move cups from one side of sink to the other. Place cups ~2" out of your reach, progress to 10" beyond reach.  Place one hand in middle of sink and reach  with other hand. Do both arms.  Then hover one hand and move cups with other hand. -just use a crossbody reach to end of safe weight shift ROM Overhead/Upward Reaching: alternated reaching up to top cabinets or ceiling if no cabinets present. Keep equal weight on each leg. Start with one hand support on counter while other hand reaches and progress to no hand support with reaching.  ace one hand in middle of sink and reach with other hand. Do both arms.  Then hover one hand and move cups with other hand.  5.   Looking Over Shoulders: With equal weight on each leg: alternate turning to look over your shoulders with one hand support on counter as needed.  Start with head motions only to look in front of shoulder, then even with shoulder and progress to looking behind you. To look to side, move head /eyes, then shoulder on  side looking pulls back, shift more weight to side looking and pull hip back. ace one hand in middle of sink and let go with other hand so your shoulder can pull back. Switch hands to look other way.   Then hover one hand and move cups with other hand.  6.  Stepping with leg that is not amputated:  Move items under cabinet out of your way. Shift your hips/pelvis so weight on prosthesis. SLOWLY step other leg so front of foot is in cabinet. Then step back to floor. - just hike the hip and place the foot back in the starting spot  GOALS: Goals reviewed with patient? Yes  SHORT TERM GOALS: Target date: 05/15/23  Pt will be independent with initial HEP for improved gait mechanics and balance  Baseline: to be updated, reports she has not been doing at home doesn't trust CNA Goal status: NOT MET  2.  Patient will ambulate at least 59ft with LRAD and no more than MinA to demonstrate improved mobility  Baseline: 47ft // bars + MinA, 10 feet with 2WW + minA Goal status: NOT met  3.  Patient will complete sit <> stand with LRAD and no more than CGA for improved independence with transfers Baseline: MinA, required minA in today's session  Goal status: NOT met - required minA due to posterior instability  4.  Patient will complete floor transfer with assist as initial step of fall recovery  Baseline: unable to complete on eval; MinA Goal status: MET   LONG TERM GOALS: Target date: 07/10/23  Pt will be independent with final HEP for improved gait mechanics and balance  Baseline: to be updated; provided Goal status: MET  Patient will ambulate at least 76ft with LRAD, ModI to demonstrate improved mobility  Baseline: 30ft // bars + MinA; 115' with supervision  Goal status: NOT MET  Patient will complete sit <> stand with LRAD. SBA for improved independence with transfers Baseline: MinA; CGA Goal status: NOT MET  Patient will complete floor transfer without assist as initial step of fall recovery   Baseline: unable to complete on eval; MinA Goal status: NOT MET  5. Patient will don/doff prosthetics independently   Baseline: MaxA, independent   Goal status: MET   NEW SHORT TERM GOALS: 08/07/23 1.Pt will be independent with new initial HEP for improved gait mechanics and balance  Baseline: to be updated; provided Goal status: NEW  2. Patient will ambulate at least 116ft with LRAD, supervision to demonstrate improved mobility  Baseline: 49ft // bars + MinA; 115' with supervision  Goal status: NEW  3.Patient will complete sit <> stand with LRAD ModI for improved independence with transfers Baseline: CGA Goal status: NEW  4. Patient will complete floor transfer without assist as initial step of fall recovery  Baseline:  MinA Goal status: NEW  NEW LONG TERM GOALS: 09/28/23  1. Pt will be independent with final HEP for improved functional strength and mobility  Baseline: to be updated Goal status: NEW  2. Patient will ambulate 238ft using LRAD and supervision for improved limited community access Baseline: 115' with supervision/CGA + RW Goal status: NEW  3. Patient will complete standing dynamic task ModI for at least 5 mins for greater independence with ADLs Baseline: ~25mins with CGA Goal status: NEW     ASSESSMENT:  CLINICAL IMPRESSION: Focus of skilled PT on addressing educational needs and precautions following recent medical procedure.  She had her gastric pacer urgently removed on 07/10/2023 following eruption through lower left abdomen.  She has general strenuous activity restriction and presented with lower BP than usual today.  Standing activity was held until further precautions follow-up can be made.  Will continue per POC if pt remains medically stable.  OBJECTIVE IMPAIRMENTS: Abnormal gait, decreased activity tolerance, decreased balance, decreased endurance, decreased knowledge of condition, decreased knowledge of use of DME, decreased strength, impaired  vision/preception, and prosthetic dependency .   ACTIVITY LIMITATIONS: carrying, lifting, standing, squatting, transfers, hygiene/grooming, locomotion level, and caring for others  PARTICIPATION LIMITATIONS: meal prep, cleaning, interpersonal relationship, driving, shopping, community activity, and occupation  PERSONAL FACTORS: Age, Behavior pattern, Past/current experiences, Social background, Time since onset of injury/illness/exacerbation, Transportation, and 3+ comorbidities: see above are also affecting patient's functional outcome.   REHAB POTENTIAL: Good  CLINICAL DECISION MAKING: Stable/uncomplicated  EVALUATION COMPLEXITY: Low  PLAN:  PT FREQUENCY: 2x/week  PT DURATION: 12 weeks  PLANNED INTERVENTIONS: 97164- PT Re-evaluation, 97110-Therapeutic exercises, 97530- Therapeutic activity, 97112- Neuromuscular re-education, 97535- Self Care, 08657- Manual therapy, [phone]- Gait training, [phone]- Prosthetic training, [phone]- Aquatic Therapy, Patient/Family education, Balance training, Stair training, Scar mobilization, Vestibular training, Visual/preceptual remediation/compensation, DME instructions, and Wheelchair mobility training  PLAN FOR NEXT SESSION: Gait, balance, floor transfers  how is modified sink HEP?, balance recovery in standing/static stability w/ dec UE reliance, walking, dynamic/standing balance, balance recovery, have we heard back from [facility]?  [facility] appt 6/16 - may need to communicate timeline regarding wound healing if any precautions!  Earlean Glaze, PT, DPT  07/21/2023, 2:32 PM

## 2023-07-24 ENCOUNTER — Ambulatory Visit

## 2023-07-24 DIAGNOSIS — R2689 Other abnormalities of gait and mobility: Secondary | ICD-10-CM

## 2023-07-24 DIAGNOSIS — M6281 Muscle weakness (generalized): Secondary | ICD-10-CM

## 2023-07-24 DIAGNOSIS — R293 Abnormal posture: Secondary | ICD-10-CM

## 2023-07-24 NOTE — Therapy (Signed)
 OUTPATIENT PHYSICAL THERAPY NEURO TREATMENT   Patient Name: Kelli Hudson MRN: [id number] DOB:07/09/1990, 33 y.o., female Today's Date: 07/24/2023   PCP: Vevelyn Gowers, NP REFERRING PROVIDER: Gearldean Keepers, MD   END OF SESSION:  PT End of Session - 07/24/23 1318     Visit Number 25    Number of Visits 49    Date for PT Re-Evaluation 10/02/23    Authorization Type Medicare/Medicaid    Progress Note Due on Visit 30    PT Start Time 1315    PT Stop Time 1339   unable to participate d/t surgical wound   PT Time Calculation (min) 24 min    Activity Tolerance Treatment limited secondary to medical complications (Comment)    Behavior During Therapy [facility] for tasks assessed/performed             Past Medical History:  Diagnosis Date   Acute H. pylori gastric ulcer    Coffee ground emesis    Diabetes mellitus (HCC)    Diabetic gastroparesis (HCC)    DKA (diabetic ketoacidosis) (HCC) 02/24/2021   Gastroparesis    GERD (gastroesophageal reflux disease)    Hypertension    Hyperthyroidism    Intractable nausea and vomiting 04/20/2021   Normocytic anemia 04/20/2020   Prolonged Q-T interval on ECG    Past Surgical History:  Procedure Laterality Date   AMPUTATION Bilateral 10/24/2022   Procedure: BILATERAL BELOW KNEE AMPUTATION;  Surgeon: Timothy Ford, MD;  Location: [facility] OR;  Service: Orthopedics;  Laterality: Bilateral;   AMPUTATION Bilateral 10/08/2022   Procedure: BILATERAL LEG DEBRIDEMENT;  Surgeon: Timothy Ford, MD;  Location: [facility] OR;  Service: Orthopedics;  Laterality: Bilateral;   AMPUTATION Bilateral 11/14/2022   Procedure: BILATERAL ABOVE KNEE AMPUTATION;  Surgeon: Timothy Ford, MD;  Location: [facility] OR;  Service: Orthopedics;  Laterality: Bilateral;   AMPUTATION TOE Left 03/10/2018   Procedure: AMPUTATION FIFTH TOE;  Surgeon: Charity Conch, DPM;  Location: [facility];  Service: Podiatry;  Laterality: Left;   APPLICATION OF WOUND VAC  Bilateral 10/12/2022   Procedure: APPLICATION OF WOUND VAC BILATERAL LEGS;  Surgeon: Hardy Lia, MD;  Location: [facility] OR;  Service: Orthopedics;  Laterality: Bilateral;   APPLICATION OF WOUND VAC Bilateral 10/24/2022   Procedure: APPLICATION OF WOUND VAC;  Surgeon: Timothy Ford, MD;  Location: [facility] OR;  Service: Orthopedics;  Laterality: Bilateral;   BIOPSY  01/28/2020   Procedure: BIOPSY;  Surgeon: Felecia Hopper, MD;  Location: [facility];  Service: Gastroenterology;;   BOTOX  INJECTION  08/26/2021   Procedure: BOTOX  INJECTION;  Surgeon: Albertina Hugger, MD;  Location: [facility];  Service: Gastroenterology;;   ESOPHAGOGASTRODUODENOSCOPY N/A 01/28/2020   Procedure: ESOPHAGOGASTRODUODENOSCOPY (EGD);  Surgeon: Felecia Hopper, MD;  Location: [facility];  Service: Gastroenterology;  Laterality: N/A;   ESOPHAGOGASTRODUODENOSCOPY (EGD) WITH PROPOFOL  Left 09/08/2015   Procedure: ESOPHAGOGASTRODUODENOSCOPY (EGD) WITH PROPOFOL ;  Surgeon: Evangeline Hilts, MD;  Location: [facility] ENDOSCOPY;  Service: Endoscopy;  Laterality: Left;   ESOPHAGOGASTRODUODENOSCOPY (EGD) WITH PROPOFOL  N/A 08/26/2021   Procedure: ESOPHAGOGASTRODUODENOSCOPY (EGD) WITH PROPOFOL ;  Surgeon: Albertina Hugger, MD;  Location: [facility];  Service: Gastroenterology;  Laterality: N/A;   GASTRIC STIMULATOR IMPLANT SURGERY  06/2022   at [facility]   I & D EXTREMITY Bilateral 10/12/2022   Procedure: IRRIGATION AND  DEBRIDEMENT  BILATERAL LEGS;  Surgeon: Hardy Lia, MD;  Location: [facility] OR;  Service: Orthopedics;  Laterality: Bilateral;   I & D EXTREMITY Right 10/13/2022   Procedure: RIGHT  THIGH WOUND VAC EXCHANGE;  Surgeon: Laneta Pintos, MD;  Location: [facility] OR;  Service: Orthopedics;  Laterality: Right;   I & D EXTREMITY Bilateral 10/17/2022   Procedure: BILATERAL THIGH AND LEG DEBRIDEMENT, PARTIAL BILATERAL FOOT AMPUTATION;  Surgeon: Timothy Ford, MD;  Location: [facility] OR;  Service: Orthopedics;  Laterality: Bilateral;   I & D EXTREMITY  Bilateral 11/05/2022   Procedure: BILATERAL THIGH DEBRIDEMENT;  Surgeon: Timothy Ford, MD;  Location: [facility] OR;  Service: Orthopedics;  Laterality: Bilateral;   I & D EXTREMITY Bilateral 11/07/2022   Procedure: BILATERAL THIGH AND LEG DEBRIDEMENT;  Surgeon: Timothy Ford, MD;  Location: [facility] OR;  Service: Orthopedics;  Laterality: Bilateral;   I & D EXTREMITY Bilateral 11/12/2022   Procedure: BILATERAL LEG DEBRIDEMENTS;  Surgeon: Timothy Ford, MD;  Location: [facility] OR;  Service: Orthopedics;  Laterality: Bilateral;   IRRIGATION AND DEBRIDEMENT FOOT Bilateral 10/05/2022   Procedure: IRRIGATION AND DEBRIDEMENT FOOT;  Surgeon: Dot Gazella, DPM;  Location: [facility]S;  Service: Orthopedics/Podiatry;  Laterality: Bilateral;   WISDOM TOOTH EXTRACTION     Patient Active Problem List   Diagnosis Date Noted   Influenza A 04/05/2023   Elevated lipase 04/05/2023   Esophagitis 03/24/2023   SIRS (systemic inflammatory response syndrome) (HCC) 03/02/2023   Fever 03/02/2023   Metabolic acidosis 03/02/2023   Hypertensive urgency 02/21/2023   Chronic pain 02/21/2023   Abscess of thigh 11/28/2022   C. 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   Hypokalemia 11/27/2022   Effusion, left knee 11/05/2022   MDD (major depressive disorder), recurrent episode, moderate (HCC) 10/16/2022   Necrotizing fasciitis of pelvic region and thigh (HCC) 10/08/2022   Necrotizing fasciitis of lower leg (HCC) 10/08/2022   Necrotizing fasciitis (HCC) 10/08/2022   Sepsis due to cellulitis (HCC) 10/03/2022   Cellulitis of lower extremity 10/03/2022   Multinodular goiter 06/18/2022   Malnutrition of moderate degree 04/18/2022   Intractable vomiting with nausea 04/17/2022   DKA (diabetic ketoacidosis) (HCC) 03/04/2022   Nausea vomiting and diarrhea 12/25/2021   Diabetic gastroparesis (HCC) 10/09/2021   Drug-seeking behavior 09/21/2021   Essential hypertension 08/25/2021   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) 08/25/2021   GERD without esophagitis 08/25/2021   Hematemesis    Prolonged Q-T interval on ECG    Nausea and vomiting 07/20/2021   Anemia of chronic disease 06/06/2021   Dehydration 06/04/2021   Hypomagnesemia 04/21/2021   Ulcerated, foot, left, with fat layer exposed (HCC) 04/20/2021   Uncontrolled type 1 diabetes mellitus with hyperglycemia, with long-term current use of insulin  (HCC) 12/18/2020   DM type 1 (diabetes mellitus, type 1) (HCC) 12/18/2020   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) 11/09/2020   Diabetic retinopathy of both eyes associated with type 1 diabetes mellitus (HCC) 09/24/2020   Diabetic gastroparesis associated with type 1 diabetes mellitus (HCC) 08/12/2020   Acute kidney injury superimposed on chronic kidney disease (HCC) 07/25/2020   Generalized abdominal pain 07/23/2020   GERD (gastroesophageal reflux disease) 07/23/2020   Diabetic nephropathy associated with type 1 diabetes mellitus (HCC) 06/27/2020   Sepsis (HCC) 05/27/2020   HLD (hyperlipidemia) 05/23/2020   Sinus tachycardia 05/09/2020   Anemia 04/20/2020   Depression with anxiety 07/05/2019   Diabetic polyneuropathy associated with type 1 diabetes mellitus (HCC) 09/24/2017   Gastroparesis    Goiter 10/05/2009   DM2 (diabetes mellitus, type 2) (HCC) 10/05/2009   Hypertension associated with  diabetes (HCC) 10/05/2009    ONSET DATE: 04/07/23 referral  REFERRING DIAG: [phone] (ICD-10-CM) - S/P AKA (above knee amputation) bilateral (HCC)   THERAPY DIAG:  Muscle weakness (generalized)  Other abnormalities of gait and mobility  Abnormal posture  Rationale for Evaluation and Treatment: Rehabilitation  SUBJECTIVE:                                                                                                                                                                                             SUBJECTIVE  STATEMENT: Patient arrives to clinic in [facility]. Reports that she is following up with [facility] on Wednesday. Still has stitches in LLQ. Reports that it's healing very well. Still sore with multiple movements and use of abdominal muscles. Denies falls. Denies s/s of infection.   Pt accompanied by: friend - Drew  PERTINENT HISTORY: DM, diabetic gastroparesis, GERD, HTN, hyperthyroidism  PAIN:  Are you having pain? No  VITALS: There were no vitals filed for this visit.     PRECAUTIONS: Fall; B diabetic retinopathy  PATIENT GOALS: "to be able to walk again"                                                                                                                              TREATMENT Theract RUE vitals prior to session: There were no vitals filed for this visit. Self care/home management:  -ed on loose abd precautions to protect integrity of surgical site  -PT POC -contact PCP re: wc evaluation  PATIENT EDUCATION: Education details: See above. Person educated: Patient and friend Education method: Medical illustrator Education comprehension: verbalized understanding and needs further education  HOME EXERCISE PROGRAM: -practice donning/doffing prosthetic Access Code: [email or web address] URL: [email or web address] Date: 04/20/2023 Prepared by: Nickola Baron  Exercises - Prone Hip Extension with Residual Limb (BKA)  - 1 x daily - 7 x weekly - 3 sets - 10 reps - Sidelying Hip Abduction (AKA)  - 1 x daily - 7 x weekly - 3 sets - 10 reps - Supine Piriformis Stretch with Residual Limb (AKA)  - 1 x  daily - 7 x weekly - 3 sets - 10 reps  Do each exercise 1-2  times per day Do each exercise 10 repetitions Hold each exercise for 2 seconds to feel your location  AT SINK FIND YOUR MIDLINE POSITION AND PLACE FEET EQUAL DISTANCE FROM THE MIDLINE.  Try to find this position when standing still for activities.   USE TAPE ON FLOOR TO MARK THE MIDLINE POSITION. You also  should try to feel with your limb pressure in socket.  You are trying to feel with limb what you used to feel with the bottom of your foot.  Side to Side Shift: Moving your hips only (not shoulders): move weight onto your left leg, HOLD/FEEL.  Move back to equal weight on each leg, HOLD/FEEL. Move weight onto your right leg, HOLD/FEEL. Move back to equal weight on each leg, HOLD/FEEL. Repeat.  Start with both hands on sink, progress to right hand only, then no hands.  Front to Back Shift: Moving your hips only (not shoulders): move your weight forward onto your toes, HOLD/FEEL. Move your weight back to equal Flat Foot on both legs, HOLD/FEEL. Move your weight back onto your heels, HOLD/FEEL. Move your weight back to equal on both legs, HOLD/FEEL. Repeat.   tart with both hands on sink, progress to right hand only, then no hands. Moving Cones / Cups: With equal weight on each leg: Hold on with one hand the first time, then progress to no hand supports. Move cups from one side of sink to the other. Place cups ~2" out of your reach, progress to 10" beyond reach.  Place one hand in middle of sink and reach with other hand. Do both arms.  Then hover one hand and move cups with other hand. -just use a crossbody reach to end of safe weight shift ROM Overhead/Upward Reaching: alternated reaching up to top cabinets or ceiling if no cabinets present. Keep equal weight on each leg. Start with one hand support on counter while other hand reaches and progress to no hand support with reaching.  ace one hand in middle of sink and reach with other hand. Do both arms.  Then hover one hand and move cups with other hand.  5.   Looking Over Shoulders: With equal weight on each leg: alternate turning to look over your shoulders with one hand support on counter as needed.  Start with head motions only to look in front of shoulder, then even with shoulder and progress to looking behind you. To look to side, move head /eyes, then  shoulder on side looking pulls back, shift more weight to side looking and pull hip back. ace one hand in middle of sink and let go with other hand so your shoulder can pull back. Switch hands to look other way.   Then hover one hand and move cups with other hand.  6.  Stepping with leg that is not amputated:  Move items under cabinet out of your way. Shift your hips/pelvis so weight on prosthesis. SLOWLY step other leg so front of foot is in cabinet. Then step back to floor. - just hike the hip and place the foot back in the starting spot  GOALS: Goals reviewed with patient? Yes  SHORT TERM GOALS: Target date: 05/15/23  Pt will be independent with initial HEP for improved gait mechanics and balance  Baseline: to be updated, reports she has not been doing at home doesn't trust CNA Goal status: NOT MET  2.  Patient will ambulate at least 26ft with LRAD and no more than MinA to demonstrate improved mobility  Baseline: 38ft // bars + MinA, 10 feet with 2WW + minA Goal status: NOT met  3.  Patient will complete sit <> stand with LRAD and no more than CGA for improved independence with transfers Baseline: MinA, required minA in today's session  Goal status: NOT met - required minA due to posterior instability  4.  Patient will complete floor transfer with assist as initial step of fall recovery  Baseline: unable to complete on eval; MinA Goal status: MET   LONG TERM GOALS: Target date: 07/10/23  Pt will be independent with final HEP for improved gait mechanics and balance  Baseline: to be updated; provided Goal status: MET  Patient will ambulate at least 13ft with LRAD, ModI to demonstrate improved mobility  Baseline: 61ft // bars + MinA; 115' with supervision  Goal status: NOT MET  Patient will complete sit <> stand with LRAD. SBA for improved independence with transfers Baseline: MinA; CGA Goal status: NOT MET  Patient will complete floor transfer without assist as initial step of  fall recovery  Baseline: unable to complete on eval; MinA Goal status: NOT MET  5. Patient will don/doff prosthetics independently   Baseline: MaxA, independent   Goal status: MET   NEW SHORT TERM GOALS: 08/07/23 1.Pt will be independent with new initial HEP for improved gait mechanics and balance  Baseline: to be updated; provided Goal status: NEW  2. Patient will ambulate at least 137ft with LRAD, supervision to demonstrate improved mobility  Baseline: 17ft // bars + MinA; 115' with supervision  Goal status: NEW  3.Patient will complete sit <> stand with LRAD ModI for improved independence with transfers Baseline: CGA Goal status: NEW  4. Patient will complete floor transfer without assist as initial step of fall recovery  Baseline:  MinA Goal status: NEW  NEW LONG TERM GOALS: 09/28/23  1. Pt will be independent with final HEP for improved functional strength and mobility  Baseline: to be updated Goal status: NEW  2. Patient will ambulate 264ft using LRAD and supervision for improved limited community access Baseline: 115' with supervision/CGA + RW Goal status: NEW  3. Patient will complete standing dynamic task ModI for at least 5 mins for greater independence with ADLs Baseline: ~17mins with CGA Goal status: NEW     ASSESSMENT:  CLINICAL IMPRESSION: Patient seen for skilled PT session with emphasis on patient education. She remains understandably sore and limited in function by recent abdominal surgery. Given her status as a B amputee, she requires extensive core strength for both static standing, as well as any ambulation. It is in her best interest to minimize that as much as possible until her surgical wound is healed, especially considering her history of poor wound healing. PT cancelled next PT appt (tues 6/10) to allow for patient to f/u with surgeon prior to returning to PT on 6/13. Patient and SO verbalized understanding. Continue POC as able.   OBJECTIVE  IMPAIRMENTS: Abnormal gait, decreased activity tolerance, decreased balance, decreased endurance, decreased knowledge of condition, decreased knowledge of use of DME, decreased strength, impaired vision/preception, and prosthetic dependency .   ACTIVITY LIMITATIONS: carrying, lifting, standing, squatting, transfers, hygiene/grooming, locomotion level, and caring for others  PARTICIPATION LIMITATIONS: meal prep, cleaning, interpersonal relationship, driving, shopping, community activity, and occupation  PERSONAL FACTORS: Age, Behavior pattern, Past/current experiences, Social background, Time since onset of injury/illness/exacerbation, Transportation, and 3+ comorbidities: see above  are also affecting patient's functional outcome.   REHAB POTENTIAL: Good  CLINICAL DECISION MAKING: Stable/uncomplicated  EVALUATION COMPLEXITY: Low  PLAN:  PT FREQUENCY: 2x/week  PT DURATION: 12 weeks  PLANNED INTERVENTIONS: 97164- PT Re-evaluation, 97110-Therapeutic exercises, 97530- Therapeutic activity, 97112- Neuromuscular re-education, 97535- Self Care, 16109- Manual therapy, [phone]- Gait training, [phone]- Prosthetic training, [phone]- Aquatic Therapy, Patient/Family education, Balance training, Stair training, Scar mobilization, Vestibular training, Visual/preceptual remediation/compensation, DME instructions, and Wheelchair mobility training  PLAN FOR NEXT SESSION: Gait, balance, floor transfers  how is modified sink HEP?, balance recovery in standing/static stability w/ dec UE reliance, walking, dynamic/standing balance, balance recovery, have we heard back from [facility]?  [facility] appt 6/16 - may need to communicate timeline regarding wound healing if any precautions!  Rebecca Campus, PT Rebecca Campus, PT, DPT, CBIS  07/24/2023, 1:50 PM

## 2023-07-28 ENCOUNTER — Encounter: Admitting: Physical Therapy

## 2023-07-31 ENCOUNTER — Ambulatory Visit: Admitting: Physical Therapy

## 2023-07-31 ENCOUNTER — Telehealth: Payer: Self-pay | Admitting: Physical Therapy

## 2023-07-31 ENCOUNTER — Encounter: Payer: Self-pay | Admitting: Physical Therapy

## 2023-07-31 VITALS — BP 92/59 | HR 90

## 2023-07-31 DIAGNOSIS — R293 Abnormal posture: Secondary | ICD-10-CM

## 2023-07-31 DIAGNOSIS — R2689 Other abnormalities of gait and mobility: Secondary | ICD-10-CM

## 2023-07-31 DIAGNOSIS — R29898 Other symptoms and signs involving the musculoskeletal system: Secondary | ICD-10-CM

## 2023-07-31 DIAGNOSIS — M6281 Muscle weakness (generalized): Secondary | ICD-10-CM | POA: Diagnosis not present

## 2023-07-31 NOTE — Telephone Encounter (Signed)
 Dr. Julio Ohm,  Kelli Hudson  was seen by PT on 07/31/2023.  The patient would benefit from a 2-wheeled walker for home use to improve transfers and gait training carryover w/ caregiver. If you agree, please place an order in [facility]que in [facility] or fax the order to [phone].  Thank you,  Marilou Showman, PT, DPT  [facility] [address] [place], [place]  [address] Phone:  [phone] Fax:  [phone]

## 2023-07-31 NOTE — Therapy (Signed)
 OUTPATIENT PHYSICAL THERAPY NEURO TREATMENT   Patient Name: Kelli Hudson MRN: [id number] DOB:04/23/1990, 33 y.o., female Today's Date: 07/31/2023   PCP: Vevelyn Gowers, NP REFERRING PROVIDER: Gearldean Keepers, MD   END OF SESSION:  PT End of Session - 07/31/23 1333     Visit Number 26    Number of Visits 49    Date for PT Re-Evaluation 10/02/23    Authorization Type Medicare/Medicaid    Progress Note Due on Visit 30    PT Start Time 1325   PT with pt prior   PT Stop Time 1408    PT Time Calculation (min) 43 min    Equipment Utilized During Treatment Gait belt    Activity Tolerance Patient tolerated treatment well;Other (comment)   BP low   Behavior During Therapy WFL for tasks assessed/performed          Past Medical History:  Diagnosis Date   Acute H. pylori gastric ulcer    Coffee ground emesis    Diabetes mellitus (HCC)    Diabetic gastroparesis (HCC)    DKA (diabetic ketoacidosis) (HCC) 02/24/2021   Gastroparesis    GERD (gastroesophageal reflux disease)    Hypertension    Hyperthyroidism    Intractable nausea and vomiting 04/20/2021   Normocytic anemia 04/20/2020   Prolonged Q-T interval on ECG    Past Surgical History:  Procedure Laterality Date   AMPUTATION Bilateral 10/24/2022   Procedure: BILATERAL BELOW KNEE AMPUTATION;  Surgeon: Timothy Ford, MD;  Location: [facility] OR;  Service: Orthopedics;  Laterality: Bilateral;   AMPUTATION Bilateral 10/08/2022   Procedure: BILATERAL LEG DEBRIDEMENT;  Surgeon: Timothy Ford, MD;  Location: [facility] OR;  Service: Orthopedics;  Laterality: Bilateral;   AMPUTATION Bilateral 11/14/2022   Procedure: BILATERAL ABOVE KNEE AMPUTATION;  Surgeon: Timothy Ford, MD;  Location: [facility] OR;  Service: Orthopedics;  Laterality: Bilateral;   AMPUTATION TOE Left 03/10/2018   Procedure: AMPUTATION FIFTH TOE;  Surgeon: Charity Conch, DPM;  Location: [facility];  Service: Podiatry;  Laterality: Left;   APPLICATION OF  WOUND VAC Bilateral 10/12/2022   Procedure: APPLICATION OF WOUND VAC BILATERAL LEGS;  Surgeon: Hardy Lia, MD;  Location: [facility] OR;  Service: Orthopedics;  Laterality: Bilateral;   APPLICATION OF WOUND VAC Bilateral 10/24/2022   Procedure: APPLICATION OF WOUND VAC;  Surgeon: Timothy Ford, MD;  Location: [facility] OR;  Service: Orthopedics;  Laterality: Bilateral;   BIOPSY  01/28/2020   Procedure: BIOPSY;  Surgeon: Felecia Hopper, MD;  Location: [facility];  Service: Gastroenterology;;   BOTOX  INJECTION  08/26/2021   Procedure: BOTOX  INJECTION;  Surgeon: Albertina Hugger, MD;  Location: [facility];  Service: Gastroenterology;;   ESOPHAGOGASTRODUODENOSCOPY N/A 01/28/2020   Procedure: ESOPHAGOGASTRODUODENOSCOPY (EGD);  Surgeon: Felecia Hopper, MD;  Location: [facility];  Service: Gastroenterology;  Laterality: N/A;   ESOPHAGOGASTRODUODENOSCOPY (EGD) WITH PROPOFOL  Left 09/08/2015   Procedure: ESOPHAGOGASTRODUODENOSCOPY (EGD) WITH PROPOFOL ;  Surgeon: Evangeline Hilts, MD;  Location: [facility] END[facility];  Service: Endoscopy;  Laterality: Left;   ESOPHAGOGASTRODUODENOSCOPY (EGD) WITH PROPOFOL  N/A 08/26/2021   Procedure: ESOPHAGOGASTRODUODENOSCOPY (EGD) WITH PROPOFOL ;  Surgeon: Albertina Hugger, MD;  Location: [facility];  Service: Gastroenterology;  Laterality: N/A;   GASTRIC STIMULATOR IMPLANT SURGERY  06/2022   at [facility]   I & D EXTREMITY Bilateral 10/12/2022   Procedure: IRRIGATION AND  DEBRIDEMENT  BILATERAL LEGS;  Surgeon: Hardy Lia, MD;  Location: [facility] OR;  Service: Orthopedics;  Laterality: Bilateral;   I & D EXTREMITY Right  10/13/2022   Procedure: RIGHT THIGH WOUND VAC EXCHANGE;  Surgeon: Laneta Pintos, MD;  Location: [facility] OR;  Service: Orthopedics;  Laterality: Right;   I & D EXTREMITY Bilateral 10/17/2022   Procedure: BILATERAL THIGH AND LEG DEBRIDEMENT, PARTIAL BILATERAL FOOT AMPUTATION;  Surgeon: Timothy Ford, MD;  Location: [facility] OR;  Service: Orthopedics;  Laterality: Bilateral;   I & D  EXTREMITY Bilateral 11/05/2022   Procedure: BILATERAL THIGH DEBRIDEMENT;  Surgeon: Timothy Ford, MD;  Location: [facility] OR;  Service: Orthopedics;  Laterality: Bilateral;   I & D EXTREMITY Bilateral 11/07/2022   Procedure: BILATERAL THIGH AND LEG DEBRIDEMENT;  Surgeon: Timothy Ford, MD;  Location: [facility] OR;  Service: Orthopedics;  Laterality: Bilateral;   I & D EXTREMITY Bilateral 11/12/2022   Procedure: BILATERAL LEG DEBRIDEMENTS;  Surgeon: Timothy Ford, MD;  Location[facility] OR;  Service: Orthopedics;  Laterality: Bilateral;   IRRIGATION AND DEBRIDEMENT FOOT Bilateral 10/05/2022   Procedure: IRRIGATION AND DEBRIDEMENT FOOT;  Surgeon: Dot Gazella, DPM;  Location: [facility];  Service: Orthopedics/Podiatry;  Laterality: Bilateral;   WISDOM TOOTH EXTRACTION     Patient Active Problem List   Diagnosis Date Noted   Influenza A 04/05/2023   Elevated lipase 04/05/2023   Esophagitis 03/24/2023   SIRS (systemic inflammatory response syndrome) (HCC) 03/02/2023   Fever 03/02/2023   Metabolic acidosis 03/02/2023   Hypertensive urgency 02/21/2023   Chronic pain 02/21/2023   Abscess of thigh 11/28/2022   C. 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   Hypokalemia 11/27/2022   Effusion, left knee 11/05/2022   MDD (major depressive disorder), recurrent episode, moderate (HCC) 10/16/2022   Necrotizing fasciitis of pelvic region and thigh (HCC) 10/08/2022   Necrotizing fasciitis of lower leg (HCC) 10/08/2022   Necrotizing fasciitis (HCC) 10/08/2022   Sepsis due to cellulitis (HCC) 10/03/2022   Cellulitis of lower extremity 10/03/2022   Multinodular goiter 06/18/2022   Malnutrition of moderate degree 04/18/2022   Intractable vomiting with nausea 04/17/2022   DKA (diabetic ketoacidosis) (HCC) 03/04/2022   Nausea vomiting and diarrhea 12/25/2021   Diabetic gastroparesis (HCC) 10/09/2021   Drug-seeking behavior 09/21/2021   Essential hypertension 08/25/2021    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) 08/25/2021   GERD without esophagitis 08/25/2021   Hematemesis    Prolonged Q-T interval on ECG    Nausea and vomiting 07/20/2021   Anemia of chronic disease 06/06/2021   Dehydration 06/04/2021   Hypomagnesemia 04/21/2021   Ulcerated, foot, left, with fat layer exposed (HCC) 04/20/2021   Uncontrolled type 1 diabetes mellitus with hyperglycemia, with long-term current use of insulin  (HCC) 12/18/2020   DM type 1 (diabetes mellitus, type 1) (HCC) 12/18/2020   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) 11/09/2020   Diabetic retinopathy of both eyes associated with type 1 diabetes mellitus (HCC) 09/24/2020   Diabetic gastroparesis associated with type 1 diabetes mellitus (HCC) 08/12/2020   Acute kidney injury superimposed on chronic kidney disease (HCC) 07/25/2020   Generalized abdominal pain 07/23/2020   GERD (gastroesophageal reflux disease) 07/23/2020   Diabetic nephropathy associated with type 1 diabetes mellitus (HCC) 06/27/2020   Sepsis (HCC) 05/27/2020   HLD (hyperlipidemia) 05/23/2020   Sinus tachycardia 05/09/2020   Anemia 04/20/2020   Depression with anxiety 07/05/2019   Diabetic polyneuropathy associated with type 1 diabetes mellitus (HCC) 09/24/2017   Gastroparesis    Goiter 10/05/2009   DM2 (diabetes mellitus, type 2) (HCC) 10/05/2009  Hypertension associated with diabetes (HCC) 10/05/2009    ONSET DATE: 04/07/23 referral  REFERRING DIAG: [phone] (ICD-10-CM) - S/P AKA (above knee amputation) bilateral (HCC)   THERAPY DIAG:  Muscle weakness (generalized)  Other abnormalities of gait and mobility  Abnormal posture  Other symptoms and signs involving the musculoskeletal system  Rationale for Evaluation and Treatment: Rehabilitation  SUBJECTIVE:                                                                                                                                                                                              SUBJECTIVE STATEMENT: Patient arrives to clinic in manual wheelchair.  She has not had [facility] visit yet, this is 6/18.  [facility] appt remains 6/16.  She feels weak from lack of activity and out of practice with donning prosthetics. Denies falls. Denies s/s of infection.   Pt accompanied by: friend - Drew  PERTINENT HISTORY: DM, diabetic gastroparesis, GERD, HTN, hyperthyroidism  PAIN:  Are you having pain? No  VITALS (LUE in sitting prior to session): Vitals:   07/31/23 1328  BP: (!) 92/59  Pulse: 90  Pt reports she is asymptomatic, but that this is low for her.  Blood sugar at onset of session (sensor on LRQ):  203   PRECAUTIONS: Fall; B diabetic retinopathy  PATIENT GOALS: to be able to walk again                                                                                                                              TREATMENT Theract RUE vitals prior to session: Today's Vitals   07/31/23 1328  BP: (!) 92/59  Pulse: 90   -PT assessed incision and protrusion sites and wounds are overall well closed and healing.  Her protrusion site appears scabbed w/ no overt concerns for infection. -PT called [facility] and spoke to Dr. Garth Kansky nurse:  pt has no precautions post-op and is able to wear an abdominal binder for lateral abdominal support as needed.  Provided resource for [facility] purchase as desired.  Pt wants to hold on this  until she sees how she is responding to activity. -Pt requires modA for donning right prosthetic due to lanyard twisting x2, able to don L prosthetic independently w/ increased time. -Practiced standing to 2WW using low rungs for balance > lateral and A/P weight shifts x several minutes to acclimate to upright, no adverse signs against gravity x several minutes, she is able to progress to forward stepping x4 ft w/ minA to adjust intermittent foot placement prior to seated recovery, reports mild tenderness in LLQ w/  stepping > prolonged standing SBA x4-5 minutes as pt was too fatigued to ambulate  PATIENT EDUCATION: Education details: See above. Person educated: Patient and friend Education method: Medical illustrator Education comprehension: verbalized understanding and needs further education  HOME EXERCISE PROGRAM: -practice donning/doffing prosthetic Access Code: [email or web address] URL: [email or web address] Date: 04/20/2023 Prepared by: Nickola Baron  Exercises - Prone Hip Extension with Residual Limb (BKA)  - 1 x daily - 7 x weekly - 3 sets - 10 reps - Sidelying Hip Abduction (AKA)  - 1 x daily - 7 x weekly - 3 sets - 10 reps - Supine Piriformis Stretch with Residual Limb (AKA)  - 1 x daily - 7 x weekly - 3 sets - 10 reps  Do each exercise 1-2  times per day Do each exercise 10 repetitions Hold each exercise for 2 seconds to feel your location  AT SINK FIND YOUR MIDLINE POSITION AND PLACE FEET EQUAL DISTANCE FROM THE MIDLINE.  Try to find this position when standing still for activities.   USE TAPE ON FLOOR TO MARK THE MIDLINE POSITION. You also should try to feel with your limb pressure in socket.  You are trying to feel with limb what you used to feel with the bottom of your foot.  Side to Side Shift: Moving your hips only (not shoulders): move weight onto your left leg, HOLD/FEEL.  Move back to equal weight on each leg, HOLD/FEEL. Move weight onto your right leg, HOLD/FEEL. Move back to equal weight on each leg, HOLD/FEEL. Repeat.  Start with both hands on sink, progress to right hand only, then no hands.  Front to Back Shift: Moving your hips only (not shoulders): move your weight forward onto your toes, HOLD/FEEL. Move your weight back to equal Flat Foot on both legs, HOLD/FEEL. Move your weight back onto your heels, HOLD/FEEL. Move your weight back to equal on both legs, HOLD/FEEL. Repeat.   tart with both hands on sink, progress to right hand only, then no hands. Moving  Cones / Cups: With equal weight on each leg: Hold on with one hand the first time, then progress to no hand supports. Move cups from one side of sink to the other. Place cups ~2" out of your reach, progress to 10" beyond reach.  Place one hand in middle of sink and reach with other hand. Do both arms.  Then hover one hand and move cups with other hand. -just use a crossbody reach to end of safe weight shift ROM Overhead/Upward Reaching: alternated reaching up to top cabinets or ceiling if no cabinets present. Keep equal weight on each leg. Start with one hand support on counter while other hand reaches and progress to no hand support with reaching.  ace one hand in middle of sink and reach with other hand. Do both arms.  Then hover one hand and move cups with other hand.  5.   Looking Over Shoulders: With equal weight on each leg: alternate turning to look  over your shoulders with one hand support on counter as needed.  Start with head motions only to look in front of shoulder, then even with shoulder and progress to looking behind you. To look to side, move head /eyes, then shoulder on side looking pulls back, shift more weight to side looking and pull hip back. ace one hand in middle of sink and let go with other hand so your shoulder can pull back. Switch hands to look other way.   Then hover one hand and move cups with other hand.  6.  Stepping with leg that is not amputated:  Move items under cabinet out of your way. Shift your hips/pelvis so weight on prosthesis. SLOWLY step other leg so front of foot is in cabinet. Then step back to floor. - just hike the hip and place the foot back in the starting spot  GOALS: Goals reviewed with patient? Yes  SHORT TERM GOALS: Target date: 05/15/23  Pt will be independent with initial HEP for improved gait mechanics and balance  Baseline: to be updated, reports she has not been doing at home doesn't trust CNA Goal status: NOT MET  2.  Patient will ambulate at  least 56ft with LRAD and no more than MinA to demonstrate improved mobility  Baseline: 37ft // bars + MinA, 10 feet with 2WW + minA Goal status: NOT met  3.  Patient will complete sit <> stand with LRAD and no more than CGA for improved independence with transfers Baseline: MinA, required minA in today's session  Goal status: NOT met - required minA due to posterior instability  4.  Patient will complete floor transfer with assist as initial step of fall recovery  Baseline: unable to complete on eval; MinA Goal status: MET   LONG TERM GOALS: Target date: 07/10/23  Pt will be independent with final HEP for improved gait mechanics and balance  Baseline: to be updated; provided Goal status: MET  Patient will ambulate at least 58ft with LRAD, ModI to demonstrate improved mobility  Baseline: 14ft // bars + MinA; 115' with supervision  Goal status: NOT MET  Patient will complete sit <> stand with LRAD. SBA for improved independence with transfers Baseline: MinA; CGA Goal status: NOT MET  Patient will complete floor transfer without assist as initial step of fall recovery  Baseline: unable to complete on eval; MinA Goal status: NOT MET  5. Patient will don/doff prosthetics independently   Baseline: MaxA, independent   Goal status: MET   NEW SHORT TERM GOALS: 08/07/23 1.Pt will be independent with new initial HEP for improved gait mechanics and balance  Baseline: to be updated; provided Goal status: NEW  2. Patient will ambulate at least 154ft with LRAD, supervision to demonstrate improved mobility  Baseline: 62ft // bars + MinA; 115' with supervision  Goal status: NEW  3.Patient will complete sit <> stand with LRAD ModI for improved independence with transfers Baseline: CGA Goal status: NEW  4. Patient will complete floor transfer without assist as initial step of fall recovery  Baseline:  MinA Goal status: NEW  NEW LONG TERM GOALS: 09/28/23  1. Pt will be independent with  final HEP for improved functional strength and mobility  Baseline: to be updated Goal status: NEW  2. Patient will ambulate 251ft using LRAD and supervision for improved limited community access Baseline: 115' with supervision/CGA + RW Goal status: NEW  3. Patient will complete standing dynamic task ModI for at least 5 mins for greater independence  with ADLs Baseline: ~14mins with CGA Goal status: NEW     ASSESSMENT:  CLINICAL IMPRESSION: Patient able to return to activity as tolerated with no precautions per [facility] team PT spoke with today.  Pt does have LLQ tenderness with short distance ambulation this visit, but not markedly different from some instances she experienced prior to procedure.  She retains her upper body reliance for balance and adjustment in standing and demonstrated good limb advancement despite recently reduced activity.  PT to pursue a 2WW for home use and inquired about her nurse being available along with Carlus Chihuahua (most consistently present to appts) to be trained for practicing transfers to 2WW and even gait training for home carryover.  Will continue per POC.  OBJECTIVE IMPAIRMENTS: Abnormal gait, decreased activity tolerance, decreased balance, decreased endurance, decreased knowledge of condition, decreased knowledge of use of DME, decreased strength, impaired vision/preception, and prosthetic dependency .   ACTIVITY LIMITATIONS: carrying, lifting, standing, squatting, transfers, hygiene/grooming, locomotion level, and caring for others  PARTICIPATION LIMITATIONS: meal prep, cleaning, interpersonal relationship, driving, shopping, community activity, and occupation  PERSONAL FACTORS: Age, Behavior pattern, Past/current experiences, Social background, Time since onset of injury/illness/exacerbation, Transportation, and 3+ comorbidities: see above are also affecting patient's functional outcome.   REHAB POTENTIAL: Good  CLINICAL DECISION MAKING:  Stable/uncomplicated  EVALUATION COMPLEXITY: Low  PLAN:  PT FREQUENCY: 2x/week  PT DURATION: 12 weeks  PLANNED INTERVENTIONS: 97164- PT Re-evaluation, 97110-Therapeutic exercises, 97530- Therapeutic activity, 97112- Neuromuscular re-education, 97535- Self Care, 16109- Manual therapy, [phone]- Gait training, [phone]- Prosthetic training, [phone]- Aquatic Therapy, Patient/Family education, Balance training, Stair training, Scar mobilization, Vestibular training, Visual/preceptual remediation/compensation, DME instructions, and Wheelchair mobility training  PLAN FOR NEXT SESSION: Gait, balance, floor transfers  how is modified sink HEP?, balance recovery in standing/static stability w/ dec UE reliance, walking, dynamic/standing balance, balance recovery  [facility] appt 6/16 - updates? [facility] - Dr. Garth Kansky nurse 6/13 phone call:  pt has no precautions post-op and is able to wear an abdominal binder for lateral abdominal support as needed  Request 2WW order, can home nurse come in for transfer/gait training?  Earlean Glaze, PT, DPT  07/31/2023, 2:09 PM

## 2023-08-03 ENCOUNTER — Other Ambulatory Visit: Payer: Self-pay | Admitting: Orthopedic Surgery

## 2023-08-03 DIAGNOSIS — Z89611 Acquired absence of right leg above knee: Secondary | ICD-10-CM

## 2023-08-04 ENCOUNTER — Ambulatory Visit: Admitting: Physical Therapy

## 2023-08-04 ENCOUNTER — Encounter: Payer: Self-pay | Admitting: Physical Therapy

## 2023-08-04 DIAGNOSIS — M6281 Muscle weakness (generalized): Secondary | ICD-10-CM

## 2023-08-04 DIAGNOSIS — R293 Abnormal posture: Secondary | ICD-10-CM

## 2023-08-04 DIAGNOSIS — R2689 Other abnormalities of gait and mobility: Secondary | ICD-10-CM

## 2023-08-04 DIAGNOSIS — R29898 Other symptoms and signs involving the musculoskeletal system: Secondary | ICD-10-CM

## 2023-08-04 NOTE — Therapy (Signed)
 OUTPATIENT PHYSICAL THERAPY NEURO TREATMENT   Patient Name: Kelli Hudson MRN: [id number] DOB:February 05, 1991, 33 y.o., female Today's Date: 08/05/2023   PCP: Vevelyn Gowers, NP REFERRING PROVIDER: Gearldean Keepers, MD   END OF SESSION:  PT End of Session - 08/04/23 1601     Visit Number 27    Number of Visits 49    Date for PT Re-Evaluation 10/02/23    Authorization Type Medicare/Medicaid    Progress Note Due on Visit 30    PT Start Time 1539    PT Stop Time 1635    PT Time Calculation (min) 56 min    Equipment Utilized During Treatment Gait belt    Activity Tolerance Patient tolerated treatment well    Behavior During Therapy WFL for tasks assessed/performed          Past Medical History:  Diagnosis Date   Acute H. pylori gastric ulcer    Coffee ground emesis    Diabetes mellitus (HCC)    Diabetic gastroparesis (HCC)    DKA (diabetic ketoacidosis) (HCC) 02/24/2021   Gastroparesis    GERD (gastroesophageal reflux disease)    Hypertension    Hyperthyroidism    Intractable nausea and vomiting 04/20/2021   Normocytic anemia 04/20/2020   Prolonged Q-T interval on ECG    Past Surgical History:  Procedure Laterality Date   AMPUTATION Bilateral 10/24/2022   Procedure: BILATERAL BELOW KNEE AMPUTATION;  Surgeon: Timothy Ford, MD;  Location: [facility] OR;  Service: Orthopedics;  Laterality: Bilateral;   AMPUTATION Bilateral 10/08/2022   Procedure: BILATERAL LEG DEBRIDEMENT;  Surgeon: Timothy Ford, MD;  Location: [facility] OR;  Service: Orthopedics;  Laterality: Bilateral;   AMPUTATION Bilateral 11/14/2022   Procedure: BILATERAL ABOVE KNEE AMPUTATION;  Surgeon: Timothy Ford, MD;  Location: [facility] OR;  Service: Orthopedics;  Laterality: Bilateral;   AMPUTATION TOE Left 03/10/2018   Procedure: AMPUTATION FIFTH TOE;  Surgeon: Charity Conch, DPM;  Location: [facility];  Service: Podiatry;  Laterality: Left;   APPLICATION OF WOUND VAC Bilateral 10/12/2022    Procedure: APPLICATION OF WOUND VAC BILATERAL LEGS;  Surgeon: Hardy Lia, MD;  Location: [facility] OR;  Service: Orthopedics;  Laterality: Bilateral;   APPLICATION OF WOUND VAC Bilateral 10/24/2022   Procedure: APPLICATION OF WOUND VAC;  Surgeon: Timothy Ford, MD;  Location: [facility] OR;  Service: Orthopedics;  Laterality: Bilateral;   BIOPSY  01/28/2020   Procedure: BIOPSY;  Surgeon: Felecia Hopper, MD;  Location: [facility];  Service: Gastroenterology;;   BOTOX  INJECTION  08/26/2021   Procedure: BOTOX  INJECTION;  Surgeon: Albertina Hugger, MD;  Location: [facility];  Service: Gastroenterology;;   ESOPHAGOGASTRODUODENOSCOPY N/A 01/28/2020   Procedure: ESOPHAGOGASTRODUODENOSCOPY (EGD);  Surgeon: Felecia Hopper, MD;  Location: [facility];  Service: Gastroenterology;  Laterality: N/A;   ESOPHAGOGASTRODUODENOSCOPY (EGD) WITH PROPOFOL  Left 09/08/2015   Procedure: ESOPHAGOGASTRODUODENOSCOPY (EGD) WITH PROPOFOL ;  Surgeon: Evangeline Hilts, MD;  Location: [facility] END[facility]COPY;  Service: Endoscopy;  Laterality: Left;   ESOPHAGOGASTRODUODENOSCOPY (EGD) WITH PROPOFOL  N/A 08/26/2021   Procedure: ESOPHAGOGASTRODUODENOSCOPY (EGD) WITH PROPOFOL ;  Surgeon: Albertina Hugger, MD;  Location: [facility];  Service: Gastroenterology;  Laterality: N/A;   GASTRIC STIMULATOR IMPLANT SURGERY  06/2022   at [facility]   I & D EXTREMITY Bilateral 10/12/2022   Procedure: IRRIGATION AND  DEBRIDEMENT  BILATERAL LEGS;  Surgeon: Hardy Lia, MD;  Location: [facility] OR;  Service: Orthopedics;  Laterality: Bilateral;   I & D EXTREMITY Right 10/13/2022   Procedure: RIGHT THIGH WOUND VAC EXCHANGE;  Surgeon: Laneta Pintos, MD;  Location: [facility] OR;  Service: Orthopedics;  Laterality: Right;   I & D EXTREMITY Bilateral 10/17/2022   Procedure: BILATERAL THIGH AND LEG DEBRIDEMENT, PARTIAL BILATERAL FOOT AMPUTATION;  Surgeon: Timothy Ford, MD;  Location: [facility] OR;  Service: Orthopedics;  Laterality: Bilateral;   I & D EXTREMITY Bilateral 11/05/2022    Procedure: BILATERAL THIGH DEBRIDEMENT;  Surgeon: Timothy Ford, MD;  Location: [facility] OR;  Service: Orthopedics;  Laterality: Bilateral;   I & D EXTREMITY Bilateral 11/07/2022   Procedure: BILATERAL THIGH AND LEG DEBRIDEMENT;  Surgeon: Timothy Ford, MD;  Location: [facility] OR;  Service: Orthopedics;  Laterality: Bilateral;   I & D EXTREMITY Bilateral 11/12/2022   Procedure: BILATERAL LEG DEBRIDEMENTS;  Surgeon: Timothy Ford, MD;  Location: [facility] OR;  Service: Orthopedics;  Laterality: Bilateral;   IRRIGATION AND DEBRIDEMENT FOOT Bilateral 10/05/2022   Procedure: IRRIGATION AND DEBRIDEMENT FOOT;  Surgeon: Dot Gazella, DPM;  Location: [facility];  Service: Orthopedics/Podiatry;  Laterality: Bilateral;   WISDOM TOOTH EXTRACTION     Patient Active Problem List   Diagnosis Date Noted   Influenza A 04/05/2023   Elevated lipase 04/05/2023   Esophagitis 03/24/2023   SIRS (systemic inflammatory response syndrome) (HCC) 03/02/2023   Fever 03/02/2023   Metabolic acidosis 03/02/2023   Hypertensive urgency 02/21/2023   Chronic pain 02/21/2023   Abscess of thigh 11/28/2022   C. 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   Hypokalemia 11/27/2022   Effusion, left knee 11/05/2022   MDD (major depressive disorder), recurrent episode, moderate (HCC) 10/16/2022   Necrotizing fasciitis of pelvic region and thigh (HCC) 10/08/2022   Necrotizing fasciitis of lower leg (HCC) 10/08/2022   Necrotizing fasciitis (HCC) 10/08/2022   Sepsis due to cellulitis (HCC) 10/03/2022   Cellulitis of lower extremity 10/03/2022   Multinodular goiter 06/18/2022   Malnutrition of moderate degree 04/18/2022   Intractable vomiting with nausea 04/17/2022   DKA (diabetic ketoacidosis) (HCC) 03/04/2022   Nausea vomiting and diarrhea 12/25/2021   Diabetic gastroparesis (HCC) 10/09/2021   Drug-seeking behavior 09/21/2021   Essential hypertension 08/25/2021   Diabetes mellitus type 1 with  complications (HCC) 08/25/2021   GERD without esophagitis 08/25/2021   Hematemesis    Prolonged Q-T interval on ECG    Nausea and vomiting 07/20/2021   Anemia of chronic disease 06/06/2021   Dehydration 06/04/2021   Hypomagnesemia 04/21/2021   Ulcerated, foot, left, with fat layer exposed (HCC) 04/20/2021   Uncontrolled type 1 diabetes mellitus with hyperglycemia, with long-term current use of insulin  (HCC) 12/18/2020   DM type 1 (diabetes mellitus, type 1) (HCC) 12/18/2020   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) 11/09/2020   Diabetic retinopathy of both eyes associated with type 1 diabetes mellitus (HCC) 09/24/2020   Diabetic gastroparesis associated with type 1 diabetes mellitus (HCC) 08/12/2020   Acute kidney injury superimposed on chronic kidney disease (HCC) 07/25/2020   Generalized abdominal pain 07/23/2020   GERD (gastroesophageal reflux disease) 07/23/2020   Diabetic nephropathy associated with type 1 diabetes mellitus (HCC) 06/27/2020   Sepsis (HCC) 05/27/2020   HLD (hyperlipidemia) 05/23/2020   Sinus tachycardia 05/09/2020   Anemia 04/20/2020   Depression with anxiety 07/05/2019   Diabetic polyneuropathy associated with type 1 diabetes mellitus (HCC) 09/24/2017   Gastroparesis    Goiter 10/05/2009   DM2 (diabetes mellitus, type 2) (HCC) 10/05/2009   Hypertension associated with diabetes (HCC) 10/05/2009  ONSET DATE: 04/07/23 referral  REFERRING DIAG: [phone] (ICD-10-CM) - S/P AKA (above knee amputation) bilateral (HCC)   THERAPY DIAG:  Muscle weakness (generalized)  Other abnormalities of gait and mobility  Abnormal posture  Other symptoms and signs involving the musculoskeletal system  Rationale for Evaluation and Treatment: Rehabilitation  SUBJECTIVE:                                                                                                                                                                                              SUBJECTIVE STATEMENT: Patient arrives to clinic in manual wheelchair.  She has not had [facility] visit yet, this is 6/18.  Transportation was late and [facility] appt was rescheduled to 7/14.  She was informed Saturday that her custom w/c was denied. Denies falls.  Pt accompanied by: friend - Drew  PERTINENT HISTORY: DM, diabetic gastroparesis, GERD, HTN, hyperthyroidism  PAIN:  Are you having pain? No  Blood sugar at onset of session (sensor on LRQ):  107   PRECAUTIONS: Fall; B diabetic retinopathy  PATIENT GOALS: to be able to walk again                                                                                                                              TREATMENT Theract/Self-care: -Time spent addressing [employer] denial concerns, contacting primary therapist regarding this evaluation denial, and providing contact information and written questions to ask regarding appeal process. -Pt dons prosthetics w/ increased time independently - single cue to untwist right lanyard, pt able to readjust without assist. -Discussed 2WW process and time to obtain - PT has order and will fax -Practiced standing to 2WW using low rungs for balance > ambulates 7 + 11 feet w/ +2 w/c follow and variable seated recovery time between bouts, better alternating foot clearance -Discussed working on standing to low counter/sink/back of second wheelchair w/ assistance at home to work towards building tolerance post-surgical event.  Edu on potential scar tissue creating tightness and discomfort in LLQ (PT unsure of expected post-op timeline of healing, but follow-up with MD if  pain persists >/=3 months or sooner if becomes more severe than current - MD is aware of discomfort and she has follow-up appt 6/18).  PATIENT EDUCATION: Education details: See above. Person educated: Patient and friend Education method: Medical illustrator Education comprehension: verbalized understanding and needs  further education  HOME EXERCISE PROGRAM: -practice donning/doffing prosthetic Access Code: [email or web address] URL: [email or web address] Date: 04/20/2023 Prepared by: Nickola Baron  Exercises - Prone Hip Extension with Residual Limb (BKA)  - 1 x daily - 7 x weekly - 3 sets - 10 reps - Sidelying Hip Abduction (AKA)  - 1 x daily - 7 x weekly - 3 sets - 10 reps - Supine Piriformis Stretch with Residual Limb (AKA)  - 1 x daily - 7 x weekly - 3 sets - 10 reps  Do each exercise 1-2  times per day Do each exercise 10 repetitions Hold each exercise for 2 seconds to feel your location  AT SINK FIND YOUR MIDLINE POSITION AND PLACE FEET EQUAL DISTANCE FROM THE MIDLINE.  Try to find this position when standing still for activities.   USE TAPE ON FLOOR TO MARK THE MIDLINE POSITION. You also should try to feel with your limb pressure in socket.  You are trying to feel with limb what you used to feel with the bottom of your foot.  Side to Side Shift: Moving your hips only (not shoulders): move weight onto your left leg, HOLD/FEEL.  Move back to equal weight on each leg, HOLD/FEEL. Move weight onto your right leg, HOLD/FEEL. Move back to equal weight on each leg, HOLD/FEEL. Repeat.  Start with both hands on sink, progress to right hand only, then no hands.  Front to Back Shift: Moving your hips only (not shoulders): move your weight forward onto your toes, HOLD/FEEL. Move your weight back to equal Flat Foot on both legs, HOLD/FEEL. Move your weight back onto your heels, HOLD/FEEL. Move your weight back to equal on both legs, HOLD/FEEL. Repeat.   tart with both hands on sink, progress to right hand only, then no hands. Moving Cones / Cups: With equal weight on each leg: Hold on with one hand the first time, then progress to no hand supports. Move cups from one side of sink to the other. Place cups ~2" out of your reach, progress to 10" beyond reach.  Place one hand in middle of sink and reach with  other hand. Do both arms.  Then hover one hand and move cups with other hand. -just use a crossbody reach to end of safe weight shift ROM Overhead/Upward Reaching: alternated reaching up to top cabinets or ceiling if no cabinets present. Keep equal weight on each leg. Start with one hand support on counter while other hand reaches and progress to no hand support with reaching.  ace one hand in middle of sink and reach with other hand. Do both arms.  Then hover one hand and move cups with other hand.  5.   Looking Over Shoulders: With equal weight on each leg: alternate turning to look over your shoulders with one hand support on counter as needed.  Start with head motions only to look in front of shoulder, then even with shoulder and progress to looking behind you. To look to side, move head /eyes, then shoulder on side looking pulls back, shift more weight to side looking and pull hip back. ace one hand in middle of sink and let go with other hand so your shoulder can pull back. Switch  hands to look other way.   Then hover one hand and move cups with other hand.  6.  Stepping with leg that is not amputated:  Move items under cabinet out of your way. Shift your hips/pelvis so weight on prosthesis. SLOWLY step other leg so front of foot is in cabinet. Then step back to floor. - just hike the hip and place the foot back in the starting spot  GOALS: Goals reviewed with patient? Yes  SHORT TERM GOALS: Target date: 05/15/23  Pt will be independent with initial HEP for improved gait mechanics and balance  Baseline: to be updated, reports she has not been doing at home doesn't trust CNA Goal status: NOT MET  2.  Patient will ambulate at least 33ft with LRAD and no more than MinA to demonstrate improved mobility  Baseline: 38ft // bars + MinA, 10 feet with 2WW + minA Goal status: NOT met  3.  Patient will complete sit <> stand with LRAD and no more than CGA for improved independence with  transfers Baseline: MinA, required minA in today's session  Goal status: NOT met - required minA due to posterior instability  4.  Patient will complete floor transfer with assist as initial step of fall recovery  Baseline: unable to complete on eval; MinA Goal status: MET   LONG TERM GOALS: Target date: 07/10/23  Pt will be independent with final HEP for improved gait mechanics and balance  Baseline: to be updated; provided Goal status: MET  Patient will ambulate at least 70ft with LRAD, ModI to demonstrate improved mobility  Baseline: 61ft // bars + MinA; 115' with supervision  Goal status: NOT MET  Patient will complete sit <> stand with LRAD. SBA for improved independence with transfers Baseline: MinA; CGA Goal status: NOT MET  Patient will complete floor transfer without assist as initial step of fall recovery  Baseline: unable to complete on eval; MinA Goal status: NOT MET  5. Patient will don/doff prosthetics independently   Baseline: MaxA, independent   Goal status: MET   NEW SHORT TERM GOALS: 08/07/23 1.Pt will be independent with new initial HEP for improved gait mechanics and balance  Baseline: to be updated; provided Goal status: NEW  2. Patient will ambulate at least 121ft with LRAD, supervision to demonstrate improved mobility  Baseline: 102ft // bars + MinA; 115' with supervision  Goal status: NEW  3.Patient will complete sit <> stand with LRAD ModI for improved independence with transfers Baseline: CGA Goal status: NEW  4. Patient will complete floor transfer without assist as initial step of fall recovery  Baseline:  MinA Goal status: NEW  NEW LONG TERM GOALS: 09/28/23  1. Pt will be independent with final HEP for improved functional strength and mobility  Baseline: to be updated Goal status: NEW  2. Patient will ambulate 26ft using LRAD and supervision for improved limited community access Baseline: 115' with supervision/CGA + RW Goal status:  NEW  3. Patient will complete standing dynamic task ModI for at least 5 mins for greater independence with ADLs Baseline: ~46mins with CGA Goal status: NEW     ASSESSMENT:  CLINICAL IMPRESSION: Session limited by ongoing DME needs and difficulty obtaining.  PT spent time locating information needed to aid appeal process as pt would greatly benefit from custom manual wheelchair to meet daily mobility needs due to prosthetic limitations.  She missed her [facility] appt due to transportation difficulty so unsure of current prosthetic plan.  Pt benefits from continued skilled  PT intervention as recent surgical intervention has set her back in her activity tolerance and gait training.  PT working on obtaining a 2WW for home use so she can begin practicing with CNA or other caregiver.  Continue per POC.  OBJECTIVE IMPAIRMENTS: Abnormal gait, decreased activity tolerance, decreased balance, decreased endurance, decreased knowledge of condition, decreased knowledge of use of DME, decreased strength, impaired vision/preception, and prosthetic dependency .   ACTIVITY LIMITATIONS: carrying, lifting, standing, squatting, transfers, hygiene/grooming, locomotion level, and caring for others  PARTICIPATION LIMITATIONS: meal prep, cleaning, interpersonal relationship, driving, shopping, community activity, and occupation  PERSONAL FACTORS: Age, Behavior pattern, Past/current experiences, Social background, Time since onset of injury/illness/exacerbation, Transportation, and 3+ comorbidities: see above are also affecting patient's functional outcome.   REHAB POTENTIAL: Good  CLINICAL DECISION MAKING: Stable/uncomplicated  EVALUATION COMPLEXITY: Low  PLAN:  PT FREQUENCY: 2x/week  PT DURATION: 12 weeks  PLANNED INTERVENTIONS: 97164- PT Re-evaluation, 97110-Therapeutic exercises, 97530- Therapeutic activity, 97112- Neuromuscular re-education, 97535- Self Care, 63016- Manual therapy, [phone]- Gait training,  [phone]- Prosthetic training, [phone]- Aquatic Therapy, Patient/Family education, Balance training, Stair training, Scar mobilization, Vestibular training, Visual/preceptual remediation/compensation, DME instructions, and Wheelchair mobility training  PLAN FOR NEXT SESSION: Gait, balance, floor transfers  how is modified sink HEP?, balance recovery in standing/static stability w/ dec UE reliance, walking, dynamic/standing balance, balance recovery, tricep strength, hip extension/abd strength/pelvic mobility; ASSESS STGs!  [facility] appt rescheduled 7/14 - updates? [facility] - Dr. Garth Kansky nurse 6/13 phone call:  pt has no precautions post-op and is able to wear an abdominal binder for lateral abdominal support as needed; feedback from 6/18 visit?  can home nurse come in for transfer/gait training?  May need to call [facility] and determine plan for elevating prosthetics to determine ongoing PT POC.  Earlean Glaze, PT, DPT  08/05/2023, 5:36 PM

## 2023-08-07 ENCOUNTER — Encounter: Payer: Self-pay | Admitting: Physical Therapy

## 2023-08-07 ENCOUNTER — Ambulatory Visit: Admitting: Physical Therapy

## 2023-08-07 DIAGNOSIS — M6281 Muscle weakness (generalized): Secondary | ICD-10-CM | POA: Diagnosis not present

## 2023-08-07 DIAGNOSIS — R293 Abnormal posture: Secondary | ICD-10-CM

## 2023-08-07 DIAGNOSIS — R29898 Other symptoms and signs involving the musculoskeletal system: Secondary | ICD-10-CM

## 2023-08-07 DIAGNOSIS — R2689 Other abnormalities of gait and mobility: Secondary | ICD-10-CM

## 2023-08-07 NOTE — Therapy (Signed)
 OUTPATIENT PHYSICAL THERAPY NEURO TREATMENT   Patient Name: Kelli Hudson MRN: [id number] DOB:1991/02/18, 33 y.o., female Today's Date: 08/07/2023   PCP: Vevelyn Gowers, NP REFERRING PROVIDER: Gearldean Keepers, MD   END OF SESSION:  PT End of Session - 08/07/23 1322     Visit Number 28    Number of Visits 49    Date for PT Re-Evaluation 10/02/23    Authorization Type Medicare/Medicaid    Progress Note Due on Visit 30    PT Start Time 1319    PT Stop Time 1403    PT Time Calculation (min) 44 min    Equipment Utilized During Treatment Gait belt    Activity Tolerance Patient tolerated treatment well    Behavior During Therapy WFL for tasks assessed/performed          Past Medical History:  Diagnosis Date   Acute H. pylori gastric ulcer    Coffee ground emesis    Diabetes mellitus (HCC)    Diabetic gastroparesis (HCC)    DKA (diabetic ketoacidosis) (HCC) 02/24/2021   Gastroparesis    GERD (gastroesophageal reflux disease)    Hypertension    Hyperthyroidism    Intractable nausea and vomiting 04/20/2021   Normocytic anemia 04/20/2020   Prolonged Q-T interval on ECG    Past Surgical History:  Procedure Laterality Date   AMPUTATION Bilateral 10/24/2022   Procedure: BILATERAL BELOW KNEE AMPUTATION;  Surgeon: Timothy Ford, MD;  Location: [facility] OR;  Service: Orthopedics;  Laterality: Bilateral;   AMPUTATION Bilateral 10/08/2022   Procedure: BILATERAL LEG DEBRIDEMENT;  Surgeon: Timothy Ford, MD;  Location: [facility] OR;  Service: Orthopedics;  Laterality: Bilateral;   AMPUTATION Bilateral 11/14/2022   Procedure: BILATERAL ABOVE KNEE AMPUTATION;  Surgeon: Timothy Ford, MD;  Location: [facility] OR;  Service: Orthopedics;  Laterality: Bilateral;   AMPUTATION TOE Left 03/10/2018   Procedure: AMPUTATION FIFTH TOE;  Surgeon: Charity Conch, DPM;  Location: [facility];  Service: Podiatry;  Laterality: Left;   APPLICATION OF WOUND VAC Bilateral 10/12/2022    Procedure: APPLICATION OF WOUND VAC BILATERAL LEGS;  Surgeon: Hardy Lia, MD;  Location: [facility] OR;  Service: Orthopedics;  Laterality: Bilateral;   APPLICATION OF WOUND VAC Bilateral 10/24/2022   Procedure: APPLICATION OF WOUND VAC;  Surgeon: Timothy Ford, MD;  Location: [facility] OR;  Service: Orthopedics;  Laterality: Bilateral;   BIOPSY  01/28/2020   Procedure: BIOPSY;  Surgeon: Felecia Hopper, MD;  Location: [facility];  Service: Gastroenterology;;   BOTOX  INJECTION  08/26/2021   Procedure: BOTOX  INJECTION;  Surgeon: Albertina Hugger, MD;  Location: [facility];  Service: Gastroenterology;;   ESOPHAGOGASTRODUODENOSCOPY N/A 01/28/2020   Procedure: ESOPHAGOGASTRODUODENOSCOPY (EGD);  Surgeon: Felecia Hopper, MD;  Location: [facility];  Service: Gastroenterology;  Laterality: N/A;   ESOPHAGOGASTRODUODENOSCOPY (EGD) WITH PROPOFOL  Left 09/08/2015   Procedure: ESOPHAGOGASTRODUODENOSCOPY (EGD) WITH PROPOFOL ;  Surgeon: Evangeline Hilts, MD;  Location: [facility] END[facility]COPY;  Service: Endoscopy;  Laterality: Left;   ESOPHAGOGASTRODUODENOSCOPY (EGD) WITH PROPOFOL  N/A 08/26/2021   Procedure: ESOPHAGOGASTRODUODENOSCOPY (EGD) WITH PROPOFOL ;  Surgeon: Albertina Hugger, MD;  Location: [facility];  Service: Gastroenterology;  Laterality: N/A;   GASTRIC STIMULATOR IMPLANT SURGERY  06/2022   at [facility]   I & D EXTREMITY Bilateral 10/12/2022   Procedure: IRRIGATION AND  DEBRIDEMENT  BILATERAL LEGS;  Surgeon: Hardy Lia, MD;  Location: [facility] OR;  Service: Orthopedics;  Laterality: Bilateral;   I & D EXTREMITY Right 10/13/2022   Procedure: RIGHT THIGH WOUND VAC EXCHANGE;  Surgeon: Laneta Pintos, MD;  Location: [facility] OR;  Service: Orthopedics;  Laterality: Right;   I & D EXTREMITY Bilateral 10/17/2022   Procedure: BILATERAL THIGH AND LEG DEBRIDEMENT, PARTIAL BILATERAL FOOT AMPUTATION;  Surgeon: Timothy Ford, MD;  Location: [facility] OR;  Service: Orthopedics;  Laterality: Bilateral;   I & D EXTREMITY Bilateral 11/05/2022    Procedure: BILATERAL THIGH DEBRIDEMENT;  Surgeon: Timothy Ford, MD;  Location: [facility] OR;  Service: Orthopedics;  Laterality: Bilateral;   I & D EXTREMITY Bilateral 11/07/2022   Procedure: BILATERAL THIGH AND LEG DEBRIDEMENT;  Surgeon: Timothy Ford, MD;  Location[facility] OR;  Service: Orthopedics;  Laterality: Bilateral;   I & D EXTREMITY Bilateral 11/12/2022   Procedure: BILATERAL LEG DEBRIDEMENTS;  Surgeon: Timothy Ford, MD;  Location: [facility] OR;  Service: Orthopedics;  Laterality: Bilateral;   IRRIGATION AND DEBRIDEMENT FOOT Bilateral 10/05/2022   Procedure: IRRIGATION AND DEBRIDEMENT FOOT;  Surgeon: Dot Gazella, DPM;  Location: [facility];  Service: Orthopedics/Podiatry;  Laterality: Bilateral;   WISDOM TOOTH EXTRACTION     Patient Active Problem List   Diagnosis Date Noted   Influenza A 04/05/2023   Elevated lipase 04/05/2023   Esophagitis 03/24/2023   SIRS (systemic inflammatory response syndrome) (HCC) 03/02/2023   Fever 03/02/2023   Metabolic acidosis 03/02/2023   Hypertensive urgency 02/21/2023   Chronic pain 02/21/2023   Abscess of thigh 11/28/2022   C. 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   Hypokalemia 11/27/2022   Effusion, left knee 11/05/2022   MDD (major depressive disorder), recurrent episode, moderate (HCC) 10/16/2022   Necrotizing fasciitis of pelvic region and thigh (HCC) 10/08/2022   Necrotizing fasciitis of lower leg (HCC) 10/08/2022   Necrotizing fasciitis (HCC) 10/08/2022   Sepsis due to cellulitis (HCC) 10/03/2022   Cellulitis of lower extremity 10/03/2022   Multinodular goiter 06/18/2022   Malnutrition of moderate degree 04/18/2022   Intractable vomiting with nausea 04/17/2022   DKA (diabetic ketoacidosis) (HCC) 03/04/2022   Nausea vomiting and diarrhea 12/25/2021   Diabetic gastroparesis (HCC) 10/09/2021   Drug-seeking behavior 09/21/2021   Essential hypertension 08/25/2021   Diabetes mellitus type 1 with  complications (HCC) 08/25/2021   GERD without esophagitis 08/25/2021   Hematemesis    Prolonged Q-T interval on ECG    Nausea and vomiting 07/20/2021   Anemia of chronic disease 06/06/2021   Dehydration 06/04/2021   Hypomagnesemia 04/21/2021   Ulcerated, foot, left, with fat layer exposed (HCC) 04/20/2021   Uncontrolled type 1 diabetes mellitus with hyperglycemia, with long-term current use of insulin  (HCC) 12/18/2020   DM type 1 (diabetes mellitus, type 1) (HCC) 12/18/2020   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) 11/09/2020   Diabetic retinopathy of both eyes associated with type 1 diabetes mellitus (HCC) 09/24/2020   Diabetic gastroparesis associated with type 1 diabetes mellitus (HCC) 08/12/2020   Acute kidney injury superimposed on chronic kidney disease (HCC) 07/25/2020   Generalized abdominal pain 07/23/2020   GERD (gastroesophageal reflux disease) 07/23/2020   Diabetic nephropathy associated with type 1 diabetes mellitus (HCC) 06/27/2020   Sepsis (HCC) 05/27/2020   HLD (hyperlipidemia) 05/23/2020   Sinus tachycardia 05/09/2020   Anemia 04/20/2020   Depression with anxiety 07/05/2019   Diabetic polyneuropathy associated with type 1 diabetes mellitus (HCC) 09/24/2017   Gastroparesis    Goiter 10/05/2009   DM2 (diabetes mellitus, type 2) (HCC) 10/05/2009   Hypertension associated with diabetes (HCC) 10/05/2009  ONSET DATE: 04/07/23 referral  REFERRING DIAG: [id number] (ICD-10-CM) - S/P AKA (above knee amputation) bilateral (HCC)   THERAPY DIAG:  Muscle weakness (generalized)  Other abnormalities of gait and mobility  Abnormal posture  Other symptoms and signs involving the musculoskeletal system  Rationale for Evaluation and Treatment: Rehabilitation  SUBJECTIVE:                                                                                                                                                                                              SUBJECTIVE STATEMENT: Patient arrives to clinic in manual wheelchair.  She had her visit with [facility] and they removed stitches in favor of steri-strips.  She reports they feel the lump under the incision will slowly go down and is likely swelling.  Denies falls.  Pt accompanied by: friend - Drew  PERTINENT HISTORY: DM, diabetic gastroparesis, GERD, HTN, hyperthyroidism  PAIN:  Are you having pain? No  Blood sugar at onset of session (sensor on LRQ):  166   PRECAUTIONS: Fall; B diabetic retinopathy  PATIENT GOALS: to be able to walk again                                                                                                                              TREATMENT Theract/Self-care: -Informed pt of [employer] having prior wheelchair on file and this being reason for denial.  Pt is uncertain if current wheelchair was purchased through insurance or out of pocket as it was just delivered to her home when her mom made some phone calls after she came home from initial facility. -Reviewed HEP, pt has not done standing or any activities discussed last session as her CNA was off for the recent holiday -Pt needs modA to don right prosthetic, ind w/ L due to vision -Stands min-modA this visit throughout session -Ambulates 27 + 29 ft = 56 ft total w/ standing prop recovery -Stood 3 minutes 47 sec working on repositioning prosthetics for improved BOS, alternating unilateral support and attempted unsupported standing w/o safe progression today, SBA-CGA  PATIENT EDUCATION: Education  details: Please wears prosthetics at least 1 hr everyday regardless of standing.  If help available you may attempt modified sink HEP.  If help unavailable you may practice bumping to edge of chair touching prosthetics to ground and returning to sitting.  Goal of touching to ground is hip extensor engagement for improved placement of feet under you to decrease work of standing - do not stand too  quickly.  Scheduling remaining appts. Person educated: Patient and friend Education method: Medical illustrator Education comprehension: verbalized understanding and needs further education  HOME EXERCISE PROGRAM: -practice donning/doffing prosthetic Access Code: [email or web address] URL: [email or web address] Date: 04/20/2023 Prepared by: Nickola Baron  Exercises - Prone Hip Extension with Residual Limb (BKA)  - 1 x daily - 7 x weekly - 3 sets - 10 reps - Sidelying Hip Abduction (AKA)  - 1 x daily - 7 x weekly - 3 sets - 10 reps - Supine Piriformis Stretch with Residual Limb (AKA)  - 1 x daily - 7 x weekly - 3 sets - 10 reps  Do each exercise 1-2  times per day Do each exercise 10 repetitions Hold each exercise for 2 seconds to feel your location  AT SINK FIND YOUR MIDLINE POSITION AND PLACE FEET EQUAL DISTANCE FROM THE MIDLINE.  Try to find this position when standing still for activities.   USE TAPE ON FLOOR TO MARK THE MIDLINE POSITION. You also should try to feel with your limb pressure in socket.  You are trying to feel with limb what you used to feel with the bottom of your foot.  Side to Side Shift: Moving your hips only (not shoulders): move weight onto your left leg, HOLD/FEEL.  Move back to equal weight on each leg, HOLD/FEEL. Move weight onto your right leg, HOLD/FEEL. Move back to equal weight on each leg, HOLD/FEEL. Repeat.  Start with both hands on sink, progress to right hand only, then no hands.  Front to Back Shift: Moving your hips only (not shoulders): move your weight forward onto your toes, HOLD/FEEL. Move your weight back to equal Flat Foot on both legs, HOLD/FEEL. Move your weight back onto your heels, HOLD/FEEL. Move your weight back to equal on both legs, HOLD/FEEL. Repeat.   tart with both hands on sink, progress to right hand only, then no hands. Moving Cones / Cups: With equal weight on each leg: Hold on with one hand the first time, then progress  to no hand supports. Move cups from one side of sink to the other. Place cups ~2" out of your reach, progress to 10" beyond reach.  Place one hand in middle of sink and reach with other hand. Do both arms.  Then hover one hand and move cups with other hand. -just use a crossbody reach to end of safe weight shift ROM Overhead/Upward Reaching: alternated reaching up to top cabinets or ceiling if no cabinets present. Keep equal weight on each leg. Start with one hand support on counter while other hand reaches and progress to no hand support with reaching.  ace one hand in middle of sink and reach with other hand. Do both arms.  Then hover one hand and move cups with other hand.  5.   Looking Over Shoulders: With equal weight on each leg: alternate turning to look over your shoulders with one hand support on counter as needed.  Start with head motions only to look in front of shoulder, then even with shoulder and progress to looking behind you. To  look to side, move head /eyes, then shoulder on side looking pulls back, shift more weight to side looking and pull hip back. ace one hand in middle of sink and let go with other hand so your shoulder can pull back. Switch hands to look other way.   Then hover one hand and move cups with other hand.  6.  Stepping with leg that is not amputated:  Move items under cabinet out of your way. Shift your hips/pelvis so weight on prosthesis. SLOWLY step other leg so front of foot is in cabinet. Then step back to floor. - just hike the hip and place the foot back in the starting spot  GOALS: Goals reviewed with patient? Yes  SHORT TERM GOALS: Target date: 05/15/23  Pt will be independent with initial HEP for improved gait mechanics and balance  Baseline: to be updated, reports she has not been doing at home doesn't trust CNA Goal status: NOT MET  2.  Patient will ambulate at least 76ft with LRAD and no more than MinA to demonstrate improved mobility  Baseline: 32ft //  bars + MinA, 10 feet with 2WW + minA Goal status: NOT met  3.  Patient will complete sit <> stand with LRAD and no more than CGA for improved independence with transfers Baseline: MinA, required minA in today's session  Goal status: NOT met - required minA due to posterior instability  4.  Patient will complete floor transfer with assist as initial step of fall recovery  Baseline: unable to complete on eval; MinA Goal status: MET   LONG TERM GOALS: Target date: 07/10/23  Pt will be independent with final HEP for improved gait mechanics and balance  Baseline: to be updated; provided Goal status: MET  Patient will ambulate at least 29ft with LRAD, ModI to demonstrate improved mobility  Baseline: 46ft // bars + MinA; 115' with supervision  Goal status: NOT MET  Patient will complete sit <> stand with LRAD. SBA for improved independence with transfers Baseline: MinA; CGA Goal status: NOT MET  Patient will complete floor transfer without assist as initial step of fall recovery  Baseline: unable to complete on eval; MinA Goal status: NOT MET  5. Patient will don/doff prosthetics independently   Baseline: MaxA, independent   Goal status: MET   NEW SHORT TERM GOALS: 08/07/23 1.Pt will be independent with new initial HEP for improved gait mechanics and balance  Baseline: to be updated; provided; has not gone back to doing sink HEP, CNA has been off for holiday (6/20) Goal status: NOT MET  2. Patient will ambulate at least 14ft with LRAD, supervision to demonstrate improved mobility  Baseline: 55ft // bars + MinA; 115' with supervision; 56' 2WW CGA (6/20) Goal status:  NOT MET  3.Patient will complete sit <> stand with LRAD ModI for improved independence with transfers Baseline: CGA; min-modA to 2WW (6/20) Goal status: NOT MET  4. Patient will complete floor transfer without assist as initial step of fall recovery  Baseline:  MinA Goal status: NEW  NEW LONG TERM GOALS:  09/28/23  1. Pt will be independent with final HEP for improved functional strength and mobility  Baseline: to be updated Goal status: NEW  2. Patient will ambulate 24ft using LRAD and supervision for improved limited community access Baseline: 115' with supervision/CGA + RW Goal status: NEW  3. Patient will complete standing dynamic task ModI for at least 5 mins for greater independence with ADLs Baseline: ~71mins with CGA Goal status:  NEW     ASSESSMENT:  CLINICAL IMPRESSION: Focus of skilled session on STG assessment w/ pt not meeting 3 of 4 goals as written.  She has had a recent medical setback impacting her endurance due to LLQ pain and fatigue post-surgically.  She is recovering well and ambulatory distances today are much progressed from session prior.  She is limited in caregiver support at home which has limited her performance of her HEP, but she was encouraged to work on a variation of exercises requiring prosthetics to be donned daily.  PT is still working to meet DME needs with custom wheelchair w/ [employer] and 2WW for progression of gait training in home.  Will continue per POC w/ pt to schedule remaining visits this POC before leaving session today.  OBJECTIVE IMPAIRMENTS: Abnormal gait, decreased activity tolerance, decreased balance, decreased endurance, decreased knowledge of condition, decreased knowledge of use of DME, decreased strength, impaired vision/preception, and prosthetic dependency .   ACTIVITY LIMITATIONS: carrying, lifting, standing, squatting, transfers, hygiene/grooming, locomotion level, and caring for others  PARTICIPATION LIMITATIONS: meal prep, cleaning, interpersonal relationship, driving, shopping, community activity, and occupation  PERSONAL FACTORS: Age, Behavior pattern, Past/current experiences, Social background, Time since onset of injury/illness/exacerbation, Transportation, and 3+ comorbidities: see above are also affecting patient's functional  outcome.   REHAB POTENTIAL: Good  CLINICAL DECISION MAKING: Stable/uncomplicated  EVALUATION COMPLEXITY: Low  PLAN:  PT FREQUENCY: 2x/week  PT DURATION: 12 weeks  PLANNED INTERVENTIONS: 97164- PT Re-evaluation, 97110-Therapeutic exercises, 97530- Therapeutic activity, 97112- Neuromuscular re-education, 97535- Self Care, 16109- Manual therapy, [phone]- Gait training, [phone]- Prosthetic training, [phone]- Aquatic Therapy, Patient/Family education, Balance training, Stair training, Scar mobilization, Vestibular training, Visual/preceptual remediation/compensation, DME instructions, and Wheelchair mobility training  PLAN FOR NEXT SESSION: Gait, balance, floor transfers  how is modified sink HEP?, balance recovery in standing/static stability w/ dec UE reliance, walking, dynamic/standing balance, balance recovery, tricep strength, hip extension/abd strength/pelvic mobility; ASSESS floor transfer and update STG!  [facility] appt rescheduled 7/14 - updates?  [facility] - Dr. Garth Kansky nurse 6/13 phone call:  pt has no precautions post-op and is able to wear an abdominal binder for lateral abdominal support as needed  can home nurse come in for transfer/gait training?  May need to call [facility] and determine plan for elevating prosthetics to determine ongoing PT POC.  2WW?  Earlean Glaze, PT, DPT  08/07/2023, 2:23 PM

## 2023-08-10 ENCOUNTER — Other Ambulatory Visit: Payer: Self-pay | Admitting: Nephrology

## 2023-08-10 DIAGNOSIS — N184 Chronic kidney disease, stage 4 (severe): Secondary | ICD-10-CM

## 2023-08-11 ENCOUNTER — Encounter: Payer: Self-pay | Admitting: Physical Therapy

## 2023-08-11 ENCOUNTER — Ambulatory Visit: Admitting: Physical Therapy

## 2023-08-11 DIAGNOSIS — M6281 Muscle weakness (generalized): Secondary | ICD-10-CM

## 2023-08-11 DIAGNOSIS — R2689 Other abnormalities of gait and mobility: Secondary | ICD-10-CM

## 2023-08-11 DIAGNOSIS — R29898 Other symptoms and signs involving the musculoskeletal system: Secondary | ICD-10-CM

## 2023-08-11 DIAGNOSIS — R293 Abnormal posture: Secondary | ICD-10-CM

## 2023-08-11 NOTE — Therapy (Signed)
 OUTPATIENT PHYSICAL THERAPY NEURO TREATMENT   Patient Name: Kelli Hudson MRN: [id number] DOB:03/19/1990, 33 y.o., female Today's Date: 08/11/2023   PCP: Daphne Lesches, NP REFERRING PROVIDER: Jerona Sage, MD   END OF SESSION:  PT End of Session - 08/11/23 1154     Visit Number 29    Number of Visits 49    Date for PT Re-Evaluation 10/02/23    Authorization Type Medicare/Medicaid    Progress Note Due on Visit 30    PT Start Time 1150   pt in bathroom on receipt from front of clinic   PT Stop Time 1244    PT Time Calculation (min) 54 min    Equipment Utilized During Treatment Gait belt    Activity Tolerance Patient tolerated treatment well    Behavior During Therapy WFL for tasks assessed/performed          Past Medical History:  Diagnosis Date   Acute H. pylori gastric ulcer    Coffee ground emesis    Diabetes mellitus (HCC)    Diabetic gastroparesis (HCC)    DKA (diabetic ketoacidosis) (HCC) 02/24/2021   Gastroparesis    GERD (gastroesophageal reflux disease)    Hypertension    Hyperthyroidism    Intractable nausea and vomiting 04/20/2021   Normocytic anemia 04/20/2020   Prolonged Q-T interval on ECG    Past Surgical History:  Procedure Laterality Date   AMPUTATION Bilateral 10/24/2022   Procedure: BILATERAL BELOW KNEE AMPUTATION;  Surgeon: Sage Jerona GAILS, MD;  Location: [facility] OR;  Service: Orthopedics;  Laterality: Bilateral;   AMPUTATION Bilateral 10/08/2022   Procedure: BILATERAL LEG DEBRIDEMENT;  Surgeon: Sage Jerona GAILS, MD;  Location: [facility] OR;  Service: Orthopedics;  Laterality: Bilateral;   AMPUTATION Bilateral 11/14/2022   Procedure: BILATERAL ABOVE KNEE AMPUTATION;  Surgeon: Sage Jerona GAILS, MD;  Location: [facility] OR;  Service: Orthopedics;  Laterality: Bilateral;   AMPUTATION TOE Left 03/10/2018   Procedure: AMPUTATION FIFTH TOE;  Surgeon: Gershon Donnice SAUNDERS, DPM;  Location:  S[facility]ERY [facility];  Service: Podiatry;  Laterality: Left;    APPLICATION OF WOUND VAC Bilateral 10/12/2022   Procedure: APPLICATION OF WOUND VAC BILATERAL LEGS;  Surgeon: Celena Sharper, MD;  Location: [facility] OR;  Service: Orthopedics;  Laterality: Bilateral;   APPLICATION OF WOUND VAC Bilateral 10/24/2022   Procedure: APPLICATION OF WOUND VAC;  Surgeon: Sage Jerona GAILS, MD;  Location: [facility] OR;  Service: Orthopedics;  Laterality: Bilateral;   BIOPSY  01/28/2020   Procedure: BIOPSY;  Surgeon: Elicia Claw, MD;  Location: [facility];  Service: Gastroenterology;;   BOTOX  INJECTION  08/26/2021   Procedure: BOTOX  INJECTION;  Surgeon: Legrand Victory LITTIE DOUGLAS, MD;  Location: [facility];  Service: Gastroenterology;;   ESOPHAGOGASTRODUODENOSCOPY N/A 01/28/2020   Procedure: ESOPHAGOGASTRODUODENOSCOPY (EGD);  Surgeon: Elicia Claw, MD;  Location: [facility];  Service: Gastroenterology;  Laterality: N/A;   ESOPHAGOGASTRODUODENOSCOPY (EGD) WITH PROPOFOL  Left 09/08/2015   Procedure: ESOPHAGOGASTRODUODENOSCOPY (EGD) WITH PROPOFOL ;  Surgeon: Elsie Cree, MD;  Location: [facility] ENDOSCOPY;  Service: Endoscopy;  Laterality: Left;   ESOPHAGOGASTRODUODENOSCOPY (EGD) WITH PROPOFOL  N/A 08/26/2021   Procedure: ESOPHAGOGASTRODUODENOSCOPY (EGD) WITH PROPOFOL ;  Surgeon: Legrand Victory LITTIE DOUGLAS, MD;  Location: [facility];  Service: Gastroenterology;  Laterality: N/A;   GASTRIC STIMULATOR IMPLANT SURGERY  06/2022   at [facility]   I & D EXTREMITY Bilateral 10/12/2022   Procedure: IRRIGATION AND  DEBRIDEMENT  BILATERAL LEGS;  Surgeon: Celena Sharper, MD;  Location: [facility] OR;  Service: Orthopedics;  Laterality: Bilateral;   I & D EXTREMITY  Right 10/13/2022   Procedure: RIGHT THIGH WOUND VAC EXCHANGE;  Surgeon: Kendal Franky SQUIBB, MD;  Location: [facility] OR;  Service: Orthopedics;  Laterality: Right;   I & D EXTREMITY Bilateral 10/17/2022   Procedure: BILATERAL THIGH AND LEG DEBRIDEMENT, PARTIAL BILATERAL FOOT AMPUTATION;  Surgeon: Harden Jerona GAILS, MD;  Location: [facility] OR;  Service: Orthopedics;  Laterality:  Bilateral;   I & D EXTREMITY Bilateral 11/05/2022   Procedure: BILATERAL THIGH DEBRIDEMENT;  Surgeon: Harden Jerona GAILS, MD;  Location: [facility] OR;  Service: Orthopedics;  Laterality: Bilateral;   I & D EXTREMITY Bilateral 11/07/2022   Procedure: BILATERAL THIGH AND LEG DEBRIDEMENT;  Surgeon: Harden Jerona GAILS, MD;  Location: [facility] OR;  Service: Orthopedics;  Laterality: Bilateral;   I & D EXTREMITY Bilateral 11/12/2022   Procedure: BILATERAL LEG DEBRIDEMENTS;  Surgeon: Harden Jerona GAILS, MD;  Location: [facility] OR;  Service: Orthopedics;  Laterality: Bilateral;   IRRIGATION AND DEBRIDEMENT FOOT Bilateral 10/05/2022   Procedure: IRRIGATION AND DEBRIDEMENT FOOT;  Surgeon: Janit Thresa HERO, DPM;  Location: [facility] ORS;  Service: Orthopedics/Podiatry;  Laterality: Bilateral;   WISDOM TOOTH EXTRACTION     Patient Active Problem List   Diagnosis Date Noted   Influenza A 04/05/2023   Elevated lipase 04/05/2023   Esophagitis 03/24/2023   SIRS (systemic inflammatory response syndrome) (HCC) 03/02/2023   Fever 03/02/2023   Metabolic acidosis 03/02/2023   Hypertensive urgency 02/21/2023   Chronic pain 02/21/2023   Abscess of thigh 11/28/2022   C. 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   Hypokalemia 11/27/2022   Effusion, left knee 11/05/2022   MDD (major depressive disorder), recurrent episode, moderate (HCC) 10/16/2022   Necrotizing fasciitis of pelvic region and thigh (HCC) 10/08/2022   Necrotizing fasciitis of lower leg (HCC) 10/08/2022   Necrotizing fasciitis (HCC) 10/08/2022   Sepsis due to cellulitis (HCC) 10/03/2022   Cellulitis of lower extremity 10/03/2022   Multinodular goiter 06/18/2022   Malnutrition of moderate degree 04/18/2022   Intractable vomiting with nausea 04/17/2022   DKA (diabetic ketoacidosis) (HCC) 03/04/2022   Nausea vomiting and diarrhea 12/25/2021   Diabetic gastroparesis (HCC) 10/09/2021   Drug-seeking behavior 09/21/2021   Essential  hypertension 08/25/2021   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) 08/25/2021   GERD without esophagitis 08/25/2021   Hematemesis    Prolonged Q-T interval on ECG    Nausea and vomiting 07/20/2021   Anemia of chronic disease 06/06/2021   Dehydration 06/04/2021   Hypomagnesemia 04/21/2021   Ulcerated, foot, left, with fat layer exposed (HCC) 04/20/2021   Uncontrolled type 1 diabetes mellitus with hyperglycemia, with long-term current use of insulin  (HCC) 12/18/2020   DM type 1 (diabetes mellitus, type 1) (HCC) 12/18/2020   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) 11/09/2020   Diabetic retinopathy of both eyes associated with type 1 diabetes mellitus (HCC) 09/24/2020   Diabetic gastroparesis associated with type 1 diabetes mellitus (HCC) 08/12/2020   Acute kidney injury superimposed on chronic kidney disease (HCC) 07/25/2020   Generalized abdominal pain 07/23/2020   GERD (gastroesophageal reflux disease) 07/23/2020   Diabetic nephropathy associated with type 1 diabetes mellitus (HCC) 06/27/2020   Sepsis (HCC) 05/27/2020   HLD (hyperlipidemia) 05/23/2020   Sinus tachycardia 05/09/2020   Anemia 04/20/2020   Depression with anxiety 07/05/2019   Diabetic polyneuropathy associated with type 1 diabetes mellitus (HCC) 09/24/2017   Gastroparesis    Goiter 10/05/2009   DM2 (diabetes mellitus, type 2) (HCC)  10/05/2009   Hypertension associated with diabetes (HCC) 10/05/2009    ONSET DATE: 04/07/23 referral  REFERRING DIAG: [phone] (ICD-10-CM) - S/P AKA (above knee amputation) bilateral (HCC)   THERAPY DIAG:  Muscle weakness (generalized)  Other abnormalities of gait and mobility  Abnormal posture  Other symptoms and signs involving the musculoskeletal system  Rationale for Evaluation and Treatment: Rehabilitation  SUBJECTIVE:                                                                                                                                                                                              SUBJECTIVE STATEMENT: Patient arrives to clinic in manual wheelchair.  Swelling in abdomen continues to go down.  She is feeling good today.  Denies falls.  Pt accompanied by: friend - Drew  PERTINENT HISTORY: DM, diabetic gastroparesis, GERD, HTN, hyperthyroidism  PAIN:  Are you having pain? No  Blood sugar at onset of session (sensor on LRQ):  316 - she administered insulin  at 930am; no reported adverse symptoms   PRECAUTIONS: Fall; B diabetic retinopathy  PATIENT GOALS: to be able to walk again                                                                                                                              TREATMENT Floor Recovery: Patient educated in floor recovery this visit using teach-back for injury assessment and sequencing of task in clinic setting.  Discussion of transfer of skills to variable scenarios outside the clinic.  Patient has most difficulty with bump from step to seat due to flexibility of seat catching clothing.  Performed 2 times.  ModA on initial attempt rear bumping from 4 inch step vs minA bumping from 4inch step w/ 2 risers.  Able to complete initial prop onto residual limbs independently from step w/ second bump into chair w/ assist.  Blood sugar 301 following. Caregiver Training:  Caregiver present: Bard - observing.   Level of Assist:  MinA and ModA.    -Pt attempts lanyard adjustment in standing w/ difficulty so PT completes for pt. -  Pt ambulates SBA w/ 2WW and +2 w/c due to fatigue x36 ft + 9 ft + 24 ft + 55 ft w/ improved stride and is able to maintain light conversation throughout.  She requires standing and seated recovery periods throughout task.    PT pushes pt to front door for ease of exiting.  PATIENT EDUCATION: Education details: Please wear prosthetics at least 1 hr everyday regardless of standing.  If help available you may attempt modified sink HEP.  If help  unavailable you may practice bumping to edge of chair touching prosthetics to ground and returning to sitting.  Goal of touching to ground is hip extensor engagement for improved placement of feet under you to decrease work of standing - do not stand too quickly.  Person educated: Patient and friend Education method: Medical illustrator Education comprehension: verbalized understanding and needs further education  HOME EXERCISE PROGRAM: -practice donning/doffing prosthetic Access Code: [email or web address] URL: [email or web address] Date: 04/20/2023 Prepared by: Delon Pop  Exercises - Prone Hip Extension with Residual Limb (BKA)  - 1 x daily - 7 x weekly - 3 sets - 10 reps - Sidelying Hip Abduction (AKA)  - 1 x daily - 7 x weekly - 3 sets - 10 reps - Supine Piriformis Stretch with Residual Limb (AKA)  - 1 x daily - 7 x weekly - 3 sets - 10 reps  Do each exercise 1-2  times per day Do each exercise 10 repetitions Hold each exercise for 2 seconds to feel your location  AT SINK FIND YOUR MIDLINE POSITION AND PLACE FEET EQUAL DISTANCE FROM THE MIDLINE.  Try to find this position when standing still for activities.   USE TAPE ON FLOOR TO MARK THE MIDLINE POSITION. You also should try to feel with your limb pressure in socket.  You are trying to feel with limb what you used to feel with the bottom of your foot.  Side to Side Shift: Moving your hips only (not shoulders): move weight onto your left leg, HOLD/FEEL.  Move back to equal weight on each leg, HOLD/FEEL. Move weight onto your right leg, HOLD/FEEL. Move back to equal weight on each leg, HOLD/FEEL. Repeat.  Start with both hands on sink, progress to right hand only, then no hands.  Front to Back Shift: Moving your hips only (not shoulders): move your weight forward onto your toes, HOLD/FEEL. Move your weight back to equal Flat Foot on both legs, HOLD/FEEL. Move your weight back onto your heels, HOLD/FEEL. Move your weight  back to equal on both legs, HOLD/FEEL. Repeat.   tart with both hands on sink, progress to right hand only, then no hands. Moving Cones / Cups: With equal weight on each leg: Hold on with one hand the first time, then progress to no hand supports. Move cups from one side of sink to the other. Place cups ~2" out of your reach, progress to 10" beyond reach.  Place one hand in middle of sink and reach with other hand. Do both arms.  Then hover one hand and move cups with other hand. -just use a crossbody reach to end of safe weight shift ROM Overhead/Upward Reaching: alternated reaching up to top cabinets or ceiling if no cabinets present. Keep equal weight on each leg. Start with one hand support on counter while other hand reaches and progress to no hand support with reaching.  ace one hand in middle of sink and reach with other hand. Do both arms.  Then hover one hand  and move cups with other hand.  5.   Looking Over Shoulders: With equal weight on each leg: alternate turning to look over your shoulders with one hand support on counter as needed.  Start with head motions only to look in front of shoulder, then even with shoulder and progress to looking behind you. To look to side, move head /eyes, then shoulder on side looking pulls back, shift more weight to side looking and pull hip back. ace one hand in middle of sink and let go with other hand so your shoulder can pull back. Switch hands to look other way.   Then hover one hand and move cups with other hand.  6.  Stepping with leg that is not amputated:  Move items under cabinet out of your way. Shift your hips/pelvis so weight on prosthesis. SLOWLY step other leg so front of foot is in cabinet. Then step back to floor. - just hike the hip and place the foot back in the starting spot  GOALS: Goals reviewed with patient? Yes  SHORT TERM GOALS: Target date: 05/15/23  Pt will be independent with initial HEP for improved gait mechanics and balance   Baseline: to be updated, reports she has not been doing at home doesn't trust CNA Goal status: NOT MET  2.  Patient will ambulate at least 70ft with LRAD and no more than MinA to demonstrate improved mobility  Baseline: 54ft // bars + MinA, 10 feet with 2WW + minA Goal status: NOT met  3.  Patient will complete sit <> stand with LRAD and no more than CGA for improved independence with transfers Baseline: MinA, required minA in today's session  Goal status: NOT met - required minA due to posterior instability  4.  Patient will complete floor transfer with assist as initial step of fall recovery  Baseline: unable to complete on eval; MinA Goal status: MET   LONG TERM GOALS: Target date: 07/10/23  Pt will be independent with final HEP for improved gait mechanics and balance  Baseline: to be updated; provided Goal status: MET  Patient will ambulate at least 109ft with LRAD, ModI to demonstrate improved mobility  Baseline: 24ft // bars + MinA; 115' with supervision  Goal status: NOT MET  Patient will complete sit <> stand with LRAD. SBA for improved independence with transfers Baseline: MinA; CGA Goal status: NOT MET  Patient will complete floor transfer without assist as initial step of fall recovery  Baseline: unable to complete on eval; MinA Goal status: NOT MET  5. Patient will don/doff prosthetics independently   Baseline: MaxA, independent   Goal status: MET   NEW SHORT TERM GOALS: 08/07/23 1.Pt will be independent with new initial HEP for improved gait mechanics and balance  Baseline: to be updated; provided; has not gone back to doing sink HEP, CNA has been off for holiday (6/20) Goal status: NOT MET  2. Patient will ambulate at least 182ft with LRAD, supervision to demonstrate improved mobility  Baseline: 55ft // bars + MinA; 115' with supervision; 56' 2WW CGA (6/20) Goal status:  NOT MET  3.Patient will complete sit <> stand with LRAD ModI for improved independence  with transfers Baseline: CGA; min-modA to 2WW (6/20) Goal status: NOT MET  4. Patient will complete floor transfer without assist as initial step of fall recovery  Baseline:  MinA; MinA from 6 step Goal status: NOT MET  NEW LONG TERM GOALS: 09/28/23  1. Pt will be independent with final HEP for  improved functional strength and mobility  Baseline: to be updated Goal status: NEW  2. Patient will ambulate 252ft using LRAD and supervision for improved limited community access Baseline: 115' with supervision/CGA + RW Goal status: NEW  3. Patient will complete standing dynamic task ModI for at least 5 mins for greater independence with ADLs Baseline: ~3mins with CGA Goal status: NEW     ASSESSMENT:  CLINICAL IMPRESSION: Emphasis of skilled session today on assessing floor transfer and returning to ambulatory progression.  She requires minA from 6 height to rear bump back into wheelchair from floor.  She is limited by end range of elbow extension vs natural height.  Her stands and stride appear stronger and more natural today and she tolerates distances up to 55 ft without flank pain.  She seems to be recovering to pre-operative baseline of mobility.  PT will further develop POC as plan for prosthetic changes takes place.  OBJECTIVE IMPAIRMENTS: Abnormal gait, decreased activity tolerance, decreased balance, decreased endurance, decreased knowledge of condition, decreased knowledge of use of DME, decreased strength, impaired vision/preception, and prosthetic dependency .   ACTIVITY LIMITATIONS: carrying, lifting, standing, squatting, transfers, hygiene/grooming, locomotion level, and caring for others  PARTICIPATION LIMITATIONS: meal prep, cleaning, interpersonal relationship, driving, shopping, community activity, and occupation  PERSONAL FACTORS: Age, Behavior pattern, Past/current experiences, Social background, Time since onset of injury/illness/exacerbation, Transportation, and 3+  comorbidities: see above are also affecting patient's functional outcome.   REHAB POTENTIAL: Good  CLINICAL DECISION MAKING: Stable/uncomplicated  EVALUATION COMPLEXITY: Low  PLAN:  PT FREQUENCY: 2x/week  PT DURATION: 12 weeks  PLANNED INTERVENTIONS: 97164- PT Re-evaluation, 97110-Therapeutic exercises, 97530- Therapeutic activity, 97112- Neuromuscular re-education, 97535- Self Care, [id number]- Manual therapy, [phone]- Gait training, [phone]- Prosthetic training, [phone]- Aquatic Therapy, Patient/Family education, Balance training, Stair training, Scar mobilization, Vestibular training, Visual/preceptual remediation/compensation, DME instructions, and Wheelchair mobility training  PLAN FOR NEXT SESSION: Gait, balance, floor transfers  how is modified sink HEP?, balance recovery in standing/static stability w/ dec UE reliance, walking, dynamic/standing balance, balance recovery, tricep strength, hip extension/abd strength/pelvic mobility  [facility] appt rescheduled 7/14 - updates?  [facility] - Dr. Kelley nurse 6/13 phone call:  pt has no precautions post-op and is able to wear an abdominal binder for lateral abdominal support as needed  can home nurse come in for transfer/gait training?  May need to call [facility] and determine plan for elevating prosthetics to determine ongoing PT POC.  2WW?  Daved KATHEE Bull, PT, DPT  08/11/2023, 1:39 PM

## 2023-08-14 ENCOUNTER — Ambulatory Visit

## 2023-08-14 VITALS — BP 124/73 | HR 89

## 2023-08-14 DIAGNOSIS — R2689 Other abnormalities of gait and mobility: Secondary | ICD-10-CM

## 2023-08-14 DIAGNOSIS — M6281 Muscle weakness (generalized): Secondary | ICD-10-CM

## 2023-08-14 DIAGNOSIS — R293 Abnormal posture: Secondary | ICD-10-CM

## 2023-08-14 NOTE — Therapy (Signed)
 OUTPATIENT PHYSICAL THERAPY NEURO TREATMENT/ 30th VISIT PROGRESS NOTE   Patient Name: Kelli Hudson MRN: [id number] DOB:22-Dec-1990, 33 y.o., female Today's Date: 08/14/2023   PCP: Daphne Lesches, NP REFERRING PROVIDER: Jerona Sage, MD  Physical Therapy Progress Note   Dates of Reporting Period:06/25/23- 08/14/23  See Note below for Objective Data and Assessment of Progress/Goals.  Thank you for the referral of this patient. [name], PT, DPT, CBIS  END OF SESSION:  PT End of Session - 08/14/23 1307     Visit Number 30    Number of Visits 49    Date for PT Re-Evaluation 10/02/23    Authorization Type Medicare/Medicaid    Progress Note Due on Visit 30    PT Start Time 1315    PT Stop Time 1400    PT Time Calculation (min) 45 min    Equipment Utilized During Treatment Gait belt    Activity Tolerance Patient tolerated treatment well    Behavior During Therapy WFL for tasks assessed/performed          Past Medical History:  Diagnosis Date   Acute H. pylori gastric ulcer    Coffee ground emesis    Diabetes mellitus (HCC)    Diabetic gastroparesis (HCC)    DKA (diabetic ketoacidosis) (HCC) 02/24/2021   Gastroparesis    GERD (gastroesophageal reflux disease)    Hypertension    Hyperthyroidism    Intractable nausea and vomiting 04/20/2021   Normocytic anemia 04/20/2020   Prolonged Q-T interval on ECG    Past Surgical History:  Procedure Laterality Date   AMPUTATION Bilateral 10/24/2022   Procedure: BILATERAL BELOW KNEE AMPUTATION;  Surgeon: Sage Jerona GAILS, MD;  Location: [facility] OR;  Service: Orthopedics;  Laterality: Bilateral;   AMPUTATION Bilateral 10/08/2022   Procedure: BILATERAL LEG DEBRIDEMENT;  Surgeon: Sage Jerona GAILS, MD;  Location: [facility] OR;  Service: Orthopedics;  Laterality: Bilateral;   AMPUTATION Bilateral 11/14/2022   Procedure: BILATERAL ABOVE KNEE AMPUTATION;  Surgeon: Sage Jerona GAILS, MD;  Location: [facility] OR;  Service: Orthopedics;   Laterality: Bilateral;   AMPUTATION TOE Left 03/10/2018   Procedure: AMPUTATION FIFTH TOE;  Surgeon: Gershon Donnice SAUNDERS, DPM;  Location: [facility];  Service: Podiatry;  Laterality: Left;   APPLICATION OF WOUND VAC Bilateral 10/12/2022   Procedure: APPLICATION OF WOUND VAC BILATERAL LEGS;  Surgeon: Celena Sharper, MD;  Location: [facility];  Service: Orthopedics;  Laterality: Bilateral;   APPLICATION OF WOUND VAC Bilateral 10/24/2022   Procedure: APPLICATION OF WOUND VAC;  Surgeon: Sage Jerona GAILS, MD;  Location: [facility];  Service: Orthopedics;  Laterality: Bilateral;   BIOPSY  01/28/2020   Procedure: BIOPSY;  Surgeon: Elicia Claw, MD;  Location: [facility];  Service: Gastroenterology;;   BOTOX  INJECTION  08/26/2021   Procedure: BOTOX  INJECTION;  Surgeon: Legrand Victory LITTIE DOUGLAS, MD;  Location: [facility];  Service: Gastroenterology;;   ESOPHAGOGASTRODUODENOSCOPY N/A 01/28/2020   Procedure: ESOPHAGOGASTRODUODENOSCOPY (EGD);  Surgeon: Elicia Claw, MD;  Location: [facility];  Service: Gastroenterology;  Laterality: N/A;   ESOPHAGOGASTRODUODENOSCOPY (EGD) WITH PROPOFOL  Left 09/08/2015   Procedure: ESOPHAGOGASTRODUODENOSCOPY (EGD) WITH PROPOFOL ;  Surgeon: Elsie Cree, MD;  Location: [facility];  Service: Endoscopy;  Laterality: Left;   ESOPHAGOGASTRODUODENOSCOPY (EGD) WITH PROPOFOL  N/A 08/26/2021   Procedure: ESOPHAGOGASTRODUODENOSCOPY (EGD) WITH PROPOFOL ;  Surgeon: Legrand Victory LITTIE DOUGLAS, MD;  Location: [facility];  Service: Gastroenterology;  Laterality: N/A;   GASTRIC STIMULATOR IMPLANT SURGERY  06/2022   at [facility]   I & D EXTREMITY Bilateral 10/12/2022  Procedure: IRRIGATION AND  DEBRIDEMENT  BILATERAL LEGS;  Surgeon: Celena Sharper, MD;  Location: [facility];  Service: Orthopedics;  Laterality: Bilateral;   I & D EXTREMITY Right 10/13/2022   Procedure: RIGHT THIGH WOUND VAC EXCHANGE;  Surgeon: Kendal Franky SQUIBB, MD;  Location: [facility];  Service: Orthopedics;  Laterality: Right;   I & D  EXTREMITY Bilateral 10/17/2022   Procedure: BILATERAL THIGH AND LEG DEBRIDEMENT, PARTIAL BILATERAL FOOT AMPUTATION;  Surgeon: Harden Jerona GAILS, MD;  Location: [facility] OR;  Service: Orthopedics;  Laterality: Bilateral;   I & D EXTREMITY Bilateral 11/05/2022   Procedure: BILATERAL THIGH DEBRIDEMENT;  Surgeon: Harden Jerona GAILS, MD;  Location: [facility] OR;  Service: Orthopedics;  Laterality: Bilateral;   I & D EXTREMITY Bilateral 11/07/2022   Procedure: BILATERAL THIGH AND LEG DEBRIDEMENT;  Surgeon: Harden Jerona GAILS, MD;  Location: [facility] OR;  Service: Orthopedics;  Laterality: Bilateral;   I & D EXTREMITY Bilateral 11/12/2022   Procedure: BILATERAL LEG DEBRIDEMENTS;  Surgeon: Harden Jerona GAILS, MD;  Location: [facility] OR;  Service: Orthopedics;  Laterality: Bilateral;   IRRIGATION AND DEBRIDEMENT FOOT Bilateral 10/05/2022   Procedure: IRRIGATION AND DEBRIDEMENT FOOT;  Surgeon: Janit Thresa HERO, DPM;  Location: [facility];  Service: Orthopedics/Podiatry;  Laterality: Bilateral;   WISDOM TOOTH EXTRACTION     Patient Active Problem List   Diagnosis Date Noted   Influenza A 04/05/2023   Elevated lipase 04/05/2023   Esophagitis 03/24/2023   SIRS (systemic inflammatory response syndrome) (HCC) 03/02/2023   Fever 03/02/2023   Metabolic acidosis 03/02/2023   Hypertensive urgency 02/21/2023   Chronic pain 02/21/2023   Abscess of thigh 11/28/2022   C. 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   Hypokalemia 11/27/2022   Effusion, left knee 11/05/2022   MDD (major depressive disorder), recurrent episode, moderate (HCC) 10/16/2022   Necrotizing fasciitis of pelvic region and thigh (HCC) 10/08/2022   Necrotizing fasciitis of lower leg (HCC) 10/08/2022   Necrotizing fasciitis (HCC) 10/08/2022   Sepsis due to cellulitis (HCC) 10/03/2022   Cellulitis of lower extremity 10/03/2022   Multinodular goiter 06/18/2022   Malnutrition of moderate degree 04/18/2022   Intractable vomiting with nausea  04/17/2022   DKA (diabetic ketoacidosis) (HCC) 03/04/2022   Nausea vomiting and diarrhea 12/25/2021   Diabetic gastroparesis (HCC) 10/09/2021   Drug-seeking behavior 09/21/2021   Essential hypertension 08/25/2021   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) 08/25/2021   GERD without esophagitis 08/25/2021   Hematemesis    Prolonged Q-T interval on ECG    Nausea and vomiting 07/20/2021   Anemia of chronic disease 06/06/2021   Dehydration 06/04/2021   Hypomagnesemia 04/21/2021   Ulcerated, foot, left, with fat layer exposed (HCC) 04/20/2021   Uncontrolled type 1 diabetes mellitus with hyperglycemia, with long-term current use of insulin  (HCC) 12/18/2020   DM type 1 (diabetes mellitus, type 1) (HCC) 12/18/2020   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) 11/09/2020   Diabetic retinopathy of both eyes associated with type 1 diabetes mellitus (HCC) 09/24/2020   Diabetic gastroparesis associated with type 1 diabetes mellitus (HCC) 08/12/2020   Acute kidney injury superimposed on chronic kidney disease (HCC) 07/25/2020   Generalized abdominal pain 07/23/2020   GERD (gastroesophageal reflux disease) 07/23/2020   Diabetic nephropathy associated with type 1 diabetes mellitus (HCC) 06/27/2020   Sepsis (HCC) 05/27/2020   HLD (hyperlipidemia) 05/23/2020   Sinus tachycardia 05/09/2020   Anemia 04/20/2020   Depression with anxiety  07/05/2019   Diabetic polyneuropathy associated with type 1 diabetes mellitus (HCC) 09/24/2017   Gastroparesis    Goiter 10/05/2009   DM2 (diabetes mellitus, type 2) (HCC) 10/05/2009   Hypertension associated with diabetes (HCC) 10/05/2009    ONSET DATE: 04/07/23 referral  REFERRING DIAG: [phone] (ICD-10-CM) - S/P AKA (above knee amputation) bilateral (HCC)   THERAPY DIAG:  Muscle weakness (generalized)  Other abnormalities of gait and mobility  Abnormal posture  Rationale for Evaluation and Treatment:  Rehabilitation  SUBJECTIVE:                                                                                                                                                                                             SUBJECTIVE STATEMENT: Patient reports doing well. Reports abdomen is healing well. Denies falls. Has not stood in prosthetics, but has practiced donning/doffing.   Pt accompanied by: friend - Drew  PERTINENT HISTORY: DM, diabetic gastroparesis, GERD, HTN, hyperthyroidism  PAIN:  Are you having pain? No  Blood sugar at onset of session (sensor on LRQ):  316 - she administered insulin  at 930am; no reported adverse symptoms   PRECAUTIONS: Fall; B diabetic retinopathy  PATIENT GOALS: to be able to walk again                                                                                                                              TREATMENT Gait: -64ft, 32ft RW + close supervision  -lateral stepping at ballet bars 2x28ft with CGA -discussed that patient must purchase 2WW privately at this time due to currently submitted order for [facility]  PATIENT EDUCATION: Education details: continue HEP, continue trying to stand at home Person educated: Patient and friend Education method: Explanation and Demonstration Education comprehension: verbalized understanding and needs further education  HOME EXERCISE PROGRAM: -practice donning/doffing prosthetic Access Code: [email or web address] URL: [email or web address] Date: 04/20/2023 Prepared by: Delon Pop  Exercises - Prone Hip Extension with Residual Limb (BKA)  - 1 x daily - 7 x weekly - 3 sets - 10 reps - Sidelying Hip Abduction (AKA)  - 1  x daily - 7 x weekly - 3 sets - 10 reps - Supine Piriformis Stretch with Residual Limb (AKA)  - 1 x daily - 7 x weekly - 3 sets - 10 reps  Do each exercise 1-2  times per day Do each exercise 10 repetitions Hold each exercise for 2 seconds to feel your location  AT SINK FIND YOUR  MIDLINE POSITION AND PLACE FEET EQUAL DISTANCE FROM THE MIDLINE.  Try to find this position when standing still for activities.   USE TAPE ON FLOOR TO MARK THE MIDLINE POSITION. You also should try to feel with your limb pressure in socket.  You are trying to feel with limb what you used to feel with the bottom of your foot.  Side to Side Shift: Moving your hips only (not shoulders): move weight onto your left leg, HOLD/FEEL.  Move back to equal weight on each leg, HOLD/FEEL. Move weight onto your right leg, HOLD/FEEL. Move back to equal weight on each leg, HOLD/FEEL. Repeat.  Start with both hands on sink, progress to right hand only, then no hands.  Front to Back Shift: Moving your hips only (not shoulders): move your weight forward onto your toes, HOLD/FEEL. Move your weight back to equal Flat Foot on both legs, HOLD/FEEL. Move your weight back onto your heels, HOLD/FEEL. Move your weight back to equal on both legs, HOLD/FEEL. Repeat.   tart with both hands on sink, progress to right hand only, then no hands. Moving Cones / Cups: With equal weight on each leg: Hold on with one hand the first time, then progress to no hand supports. Move cups from one side of sink to the other. Place cups ~2" out of your reach, progress to 10" beyond reach.  Place one hand in middle of sink and reach with other hand. Do both arms.  Then hover one hand and move cups with other hand. -just use a crossbody reach to end of safe weight shift ROM Overhead/Upward Reaching: alternated reaching up to top cabinets or ceiling if no cabinets present. Keep equal weight on each leg. Start with one hand support on counter while other hand reaches and progress to no hand support with reaching.  ace one hand in middle of sink and reach with other hand. Do both arms.  Then hover one hand and move cups with other hand.  5.   Looking Over Shoulders: With equal weight on each leg: alternate turning to look over your shoulders with one hand  support on counter as needed.  Start with head motions only to look in front of shoulder, then even with shoulder and progress to looking behind you. To look to side, move head /eyes, then shoulder on side looking pulls back, shift more weight to side looking and pull hip back. ace one hand in middle of sink and let go with other hand so your shoulder can pull back. Switch hands to look other way.   Then hover one hand and move cups with other hand.  6.  Stepping with leg that is not amputated:  Move items under cabinet out of your way. Shift your hips/pelvis so weight on prosthesis. SLOWLY step other leg so front of foot is in cabinet. Then step back to floor. - just hike the hip and place the foot back in the starting spot  GOALS: Goals reviewed with patient? Yes  SHORT TERM GOALS: Target date: 05/15/23  Pt will be independent with initial HEP for improved gait mechanics and  balance  Baseline: to be updated, reports she has not been doing at home doesn't trust CNA Goal status: NOT MET  2.  Patient will ambulate at least 44ft with LRAD and no more than MinA to demonstrate improved mobility  Baseline: 11ft // bars + MinA, 10 feet with 2WW + minA Goal status: NOT met  3.  Patient will complete sit <> stand with LRAD and no more than CGA for improved independence with transfers Baseline: MinA, required minA in today's session  Goal status: NOT met - required minA due to posterior instability  4.  Patient will complete floor transfer with assist as initial step of fall recovery  Baseline: unable to complete on eval; MinA Goal status: MET   LONG TERM GOALS: Target date: 07/10/23  Pt will be independent with final HEP for improved gait mechanics and balance  Baseline: to be updated; provided Goal status: MET  Patient will ambulate at least 8ft with LRAD, ModI to demonstrate improved mobility  Baseline: 71ft // bars + MinA; 115' with supervision  Goal status: NOT MET  Patient will  complete sit <> stand with LRAD. SBA for improved independence with transfers Baseline: MinA; CGA Goal status: NOT MET  Patient will complete floor transfer without assist as initial step of fall recovery  Baseline: unable to complete on eval; MinA Goal status: NOT MET  5. Patient will don/doff prosthetics independently   Baseline: MaxA, independent   Goal status: MET   NEW SHORT TERM GOALS: 08/07/23 1.Pt will be independent with new initial HEP for improved gait mechanics and balance  Baseline: to be updated; provided; has not gone back to doing sink HEP, CNA has been off for holiday (6/20) Goal status: NOT MET  2. Patient will ambulate at least 171ft with LRAD, supervision to demonstrate improved mobility  Baseline: 62ft // bars + MinA; 115' with supervision; 56' 2WW CGA (6/20) Goal status:  NOT MET  3.Patient will complete sit <> stand with LRAD ModI for improved independence with transfers Baseline: CGA; min-modA to 2WW (6/20) Goal status: NOT MET  4. Patient will complete floor transfer without assist as initial step of fall recovery  Baseline:  MinA; MinA from 6 step Goal status: NOT MET  NEW LONG TERM GOALS: 09/28/23  1. Pt will be independent with final HEP for improved functional strength and mobility  Baseline: to be updated Goal status: NEW  2. Patient will ambulate 256ft using LRAD and supervision for improved limited community access Baseline: 115' with supervision/CGA + RW Goal status: NEW  3. Patient will complete standing dynamic task ModI for at least 5 mins for greater independence with ADLs Baseline: ~61mins with CGA Goal status: NEW     ASSESSMENT:  CLINICAL IMPRESSION: Patient seen for skilled PT session with emphasis on continuing gait training with stubbies. Progressing well with dynamic balance without PT intervention to regain mild LOB. Increased challenge with R lateral stepping due to need to shift weight significantly to the R in order to  adduct L LE with enough clearance. Continue POC.   OBJECTIVE IMPAIRMENTS: Abnormal gait, decreased activity tolerance, decreased balance, decreased endurance, decreased knowledge of condition, decreased knowledge of use of DME, decreased strength, impaired vision/preception, and prosthetic dependency .   ACTIVITY LIMITATIONS: carrying, lifting, standing, squatting, transfers, hygiene/grooming, locomotion level, and caring for others  PARTICIPATION LIMITATIONS: meal prep, cleaning, interpersonal relationship, driving, shopping, community activity, and occupation  PERSONAL FACTORS: Age, Behavior pattern, Past/current experiences, Social background, Time since onset of injury/illness/exacerbation,  Transportation, and 3+ comorbidities: see above are also affecting patient's functional outcome.   REHAB POTENTIAL: Good  CLINICAL DECISION MAKING: Stable/uncomplicated  EVALUATION COMPLEXITY: Low  PLAN:  PT FREQUENCY: 2x/week  PT DURATION: 12 weeks  PLANNED INTERVENTIONS: 97164- PT Re-evaluation, 97110-Therapeutic exercises, 97530- Therapeutic activity, 97112- Neuromuscular re-education, 97535- Self Care, [id number]- Manual therapy, [phone]- Gait training, [phone]- Prosthetic training, [phone]- Aquatic Therapy, Patient/Family education, Balance training, Stair training, Scar mobilization, Vestibular training, Visual/preceptual remediation/compensation, DME instructions, and Wheelchair mobility training  PLAN FOR NEXT SESSION: Gait, balance, floor transfers  how is modified sink HEP?, balance recovery in standing/static stability w/ dec UE reliance, walking, dynamic/standing balance, balance recovery, tricep strength, hip extension/abd strength/pelvic mobility  [facility] appt rescheduled 7/14 - updates?  [facility] - Dr. Kelley nurse 6/13 phone call:  pt has no precautions post-op and is able to wear an abdominal binder for lateral abdominal support as needed  can home nurse come in for transfer/gait  training?  May need to call [facility] and determine plan for elevating prosthetics to determine ongoing PT POC.   [name], PT [name], PT, DPT, CBIS  08/14/2023, 3:03 PM

## 2023-08-17 ENCOUNTER — Other Ambulatory Visit

## 2023-08-18 ENCOUNTER — Ambulatory Visit
Admission: RE | Admit: 2023-08-18 | Discharge: 2023-08-18 | Disposition: A | Discharge status: Home / Self Care | Source: Ambulatory Visit | Attending: Nephrology | Admitting: Nephrology

## 2023-08-18 ENCOUNTER — Ambulatory Visit: Attending: Orthopedic Surgery

## 2023-08-18 DIAGNOSIS — M6281 Muscle weakness (generalized): Secondary | ICD-10-CM | POA: Diagnosis present

## 2023-08-18 DIAGNOSIS — R293 Abnormal posture: Secondary | ICD-10-CM | POA: Diagnosis present

## 2023-08-18 DIAGNOSIS — R2689 Other abnormalities of gait and mobility: Secondary | ICD-10-CM | POA: Insufficient documentation

## 2023-08-18 DIAGNOSIS — N184 Chronic kidney disease, stage 4 (severe): Secondary | ICD-10-CM

## 2023-08-18 DIAGNOSIS — R29898 Other symptoms and signs involving the musculoskeletal system: Secondary | ICD-10-CM | POA: Diagnosis present

## 2023-08-18 NOTE — Therapy (Unsigned)
 OUTPATIENT PHYSICAL THERAPY NEURO TREATMENT   Patient Name: Kelli Hudson MRN: [id number] DOB:1990/03/29, 33 y.o., female Today's Date: 08/19/2023   PCP: Daphne Lesches, NP REFERRING PROVIDER: Jerona Sage, MD  END OF SESSION:  PT End of Session - 08/18/23 1448     Visit Number 31    Number of Visits 49    Date for PT Re-Evaluation 10/02/23    Authorization Type Medicare/Medicaid    Progress Note Due on Visit 40    PT Start Time 1446    PT Stop Time 1530    PT Time Calculation (min) 44 min    Equipment Utilized During Treatment Gait belt    Activity Tolerance Patient tolerated treatment well    Behavior During Therapy WFL for tasks assessed/performed          Past Medical History:  Diagnosis Date   Acute H. pylori gastric ulcer    Coffee ground emesis    Diabetes mellitus (HCC)    Diabetic gastroparesis (HCC)    DKA (diabetic ketoacidosis) (HCC) 02/24/2021   Gastroparesis    GERD (gastroesophageal reflux disease)    Hypertension    Hyperthyroidism    Intractable nausea and vomiting 04/20/2021   Normocytic anemia 04/20/2020   Prolonged Q-T interval on ECG    Past Surgical History:  Procedure Laterality Date   AMPUTATION Bilateral 10/24/2022   Procedure: BILATERAL BELOW KNEE AMPUTATION;  Surgeon: Sage Jerona GAILS, MD;  Location: [facility] OR;  Service: Orthopedics;  Laterality: Bilateral;   AMPUTATION Bilateral 10/08/2022   Procedure: BILATERAL LEG DEBRIDEMENT;  Surgeon: Sage Jerona GAILS, MD;  Location: [facility] OR;  Service: Orthopedics;  Laterality: Bilateral;   AMPUTATION Bilateral 11/14/2022   Procedure: BILATERAL ABOVE KNEE AMPUTATION;  Surgeon: Sage Jerona GAILS, MD;  Location: [facility] OR;  Service: Orthopedics;  Laterality: Bilateral;   AMPUTATION TOE Left 03/10/2018   Procedure: AMPUTATION FIFTH TOE;  Surgeon: Gershon Donnice SAUNDERS, DPM;  Location: [facility];  Service: Podiatry;  Laterality: Left;   APPLICATION OF WOUND VAC Bilateral 10/12/2022   Procedure:  APPLICATION OF WOUND VAC BILATERAL LEGS;  Surgeon: Celena Sharper, MD;  Location: [facility] OR;  Service: Orthopedics;  Laterality: Bilateral;   APPLICATION OF WOUND VAC Bilateral 10/24/2022   Procedure: APPLICATION OF WOUND VAC;  Surgeon: Sage Jerona GAILS, MD;  Location: [facility] OR;  Service: Orthopedics;  Laterality: Bilateral;   BIOPSY  01/28/2020   Procedure: BIOPSY;  Surgeon: Elicia Claw, MD;  Location: [facility];  Service: Gastroenterology;;   BOTOX  INJECTION  08/26/2021   Procedure: BOTOX  INJECTION;  Surgeon: Legrand Victory LITTIE DOUGLAS, MD;  Location: [facility];  Service: Gastroenterology;;   ESOPHAGOGASTRODUODENOSCOPY N/A 01/28/2020   Procedure: ESOPHAGOGASTRODUODENOSCOPY (EGD);  Surgeon: Elicia Claw, MD;  Location: [facility];  Service: Gastroenterology;  Laterality: N/A;   ESOPHAGOGASTRODUODENOSCOPY (EGD) WITH PROPOFOL  Left 09/08/2015   Procedure: ESOPHAGOGASTRODUODENOSCOPY (EGD) WITH PROPOFOL ;  Surgeon: Elsie Cree, MD;  Location: [facility] END[facility]COPY;  Service: Endoscopy;  Laterality: Left;   ESOPHAGOGASTRODUODENOSCOPY (EGD) WITH PROPOFOL  N/A 08/26/2021   Procedure: ESOPHAGOGASTRODUODENOSCOPY (EGD) WITH PROPOFOL ;  Surgeon: Legrand Victory LITTIE DOUGLAS, MD;  Location: [facility];  Service: Gastroenterology;  Laterality: N/A;   GASTRIC STIMULATOR IMPLANT SURGERY  06/2022   at [facility]   I & D EXTREMITY Bilateral 10/12/2022   Procedure: IRRIGATION AND  DEBRIDEMENT  BILATERAL LEGS;  Surgeon: Celena Sharper, MD;  Location: [facility] OR;  Service: Orthopedics;  Laterality: Bilateral;   I & D EXTREMITY Right 10/13/2022   Procedure: RIGHT THIGH WOUND VAC EXCHANGE;  Surgeon: Kendal Franky SQUIBB, MD;  Location: [facility] OR;  Service: Orthopedics;  Laterality: Right;   I & D EXTREMITY Bilateral 10/17/2022   Procedure: BILATERAL THIGH AND LEG DEBRIDEMENT, PARTIAL BILATERAL FOOT AMPUTATION;  Surgeon: Harden Jerona GAILS, MD;  Location: [facility] OR;  Service: Orthopedics;  Laterality: Bilateral;   I & D EXTREMITY Bilateral 11/05/2022   Procedure:  BILATERAL THIGH DEBRIDEMENT;  Surgeon: Harden Jerona GAILS, MD;  Location: [facility] OR;  Service: Orthopedics;  Laterality: Bilateral;   I & D EXTREMITY Bilateral 11/07/2022   Procedure: BILATERAL THIGH AND LEG DEBRIDEMENT;  Surgeon: Harden Jerona GAILS, MD;  Location: [facility] OR;  Service: Orthopedics;  Laterality: Bilateral;   I & D EXTREMITY Bilateral 11/12/2022   Procedure: BILATERAL LEG DEBRIDEMENTS;  Surgeon: Harden Jerona GAILS, MD;  Location: [facility] OR;  Service: Orthopedics;  Laterality: Bilateral;   IRRIGATION AND DEBRIDEMENT FOOT Bilateral 10/05/2022   Procedure: IRRIGATION AND DEBRIDEMENT FOOT;  Surgeon: Janit Thresa HERO, DPM;  Location: [facility]S;  Service: Orthopedics/Podiatry;  Laterality: Bilateral;   WISDOM TOOTH EXTRACTION     Patient Active Problem List   Diagnosis Date Noted   Influenza A 04/05/2023   Elevated lipase 04/05/2023   Esophagitis 03/24/2023   SIRS (systemic inflammatory response syndrome) (HCC) 03/02/2023   Fever 03/02/2023   Metabolic acidosis 03/02/2023   Hypertensive urgency 02/21/2023   Chronic pain 02/21/2023   Abscess of thigh 11/28/2022   C. 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   Hypokalemia 11/27/2022   Effusion, left knee 11/05/2022   MDD (major depressive disorder), recurrent episode, moderate (HCC) 10/16/2022   Necrotizing fasciitis of pelvic region and thigh (HCC) 10/08/2022   Necrotizing fasciitis of lower leg (HCC) 10/08/2022   Necrotizing fasciitis (HCC) 10/08/2022   Sepsis due to cellulitis (HCC) 10/03/2022   Cellulitis of lower extremity 10/03/2022   Multinodular goiter 06/18/2022   Malnutrition of moderate degree 04/18/2022   Intractable vomiting with nausea 04/17/2022   DKA (diabetic ketoacidosis) (HCC) 03/04/2022   Nausea vomiting and diarrhea 12/25/2021   Diabetic gastroparesis (HCC) 10/09/2021   Drug-seeking behavior 09/21/2021   Essential hypertension 08/25/2021   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) 08/25/2021   GERD without esophagitis 08/25/2021   Hematemesis    Prolonged Q-T interval on ECG    Nausea and vomiting 07/20/2021   Anemia of chronic disease 06/06/2021   Dehydration 06/04/2021   Hypomagnesemia 04/21/2021   Ulcerated, foot, left, with fat layer exposed (HCC) 04/20/2021   Uncontrolled type 1 diabetes mellitus with hyperglycemia, with long-term current use of insulin  (HCC) 12/18/2020   DM type 1 (diabetes mellitus, type 1) (HCC) 12/18/2020   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) 11/09/2020   Diabetic retinopathy of both eyes associated with type 1 diabetes mellitus (HCC) 09/24/2020   Diabetic gastroparesis associated with type 1 diabetes mellitus (HCC) 08/12/2020   Acute kidney injury superimposed on chronic kidney disease (HCC) 07/25/2020   Generalized abdominal pain 07/23/2020   GERD (gastroesophageal reflux disease) 07/23/2020   Diabetic nephropathy associated with type 1 diabetes mellitus (HCC) 06/27/2020   Sepsis (HCC) 05/27/2020   HLD (hyperlipidemia) 05/23/2020   Sinus tachycardia 05/09/2020   Anemia 04/20/2020   Depression with anxiety 07/05/2019   Diabetic polyneuropathy associated with type 1 diabetes mellitus (HCC) 09/24/2017   Gastroparesis    Goiter 10/05/2009   DM2 (diabetes mellitus, type 2) (HCC) 10/05/2009   Hypertension associated with diabetes (HCC) 10/05/2009  ONSET DATE: 04/07/23 referral  REFERRING DIAG: [phone] (ICD-10-CM) - S/P AKA (above knee amputation) bilateral (HCC)   THERAPY DIAG:  Muscle weakness (generalized)  Other abnormalities of gait and mobility  Abnormal posture  Rationale for Evaluation and Treatment: Rehabilitation  SUBJECTIVE:                                                                                                                                                                                             SUBJECTIVE STATEMENT: Patient reports doing well. Has not  heard anything from [employer]. Belly feeling better. Brought in her walker today. Denies falls.   Pt accompanied by: friend - Drew  PERTINENT HISTORY: DM, diabetic gastroparesis, GERD, HTN, hyperthyroidism  PAIN:  Are you having pain? No  Blood sugar at onset of session (sensor on LRQ):  316 - she administered insulin  at 930am; no reported adverse symptoms   PRECAUTIONS: Fall; B diabetic retinopathy  PATIENT GOALS: to be able to walk again                                                                                                                              TREATMENT Gait: -time spent adjusting personal walker to use lower rungs and appropriate height -x33ft with bariatric RW + CGA/close supervision   -initially with significantly toed-in R foot positioning  -PT adjusted to correct for neutral foot position with much greater stability   Theract: -standing dynamic balance manipulating cones from one side of table to other simulating sink at home  -static standing balance with no UE with simple dual cog task overlay   PATIENT EDUCATION: Education details: continue HEP, continue trying to stand at home Person educated: Patient and friend Education method: Explanation and Demonstration Education comprehension: verbalized understanding and needs further education  HOME EXERCISE PROGRAM: -practice donning/doffing prosthetic Access Code: [email or web address] URL: [email or web address] Date: 04/20/2023 Prepared by: Delon Pop  Exercises - Prone Hip Extension with Residual Limb (BKA)  - 1 x daily - 7 x weekly - 3 sets - 10 reps - Sidelying Hip Abduction (AKA)  -  1 x daily - 7 x weekly - 3 sets - 10 reps - Supine Piriformis Stretch with Residual Limb (AKA)  - 1 x daily - 7 x weekly - 3 sets - 10 reps  Do each exercise 1-2  times per day Do each exercise 10 repetitions Hold each exercise for 2 seconds to feel your location  AT SINK FIND YOUR MIDLINE POSITION AND  PLACE FEET EQUAL DISTANCE FROM THE MIDLINE.  Try to find this position when standing still for activities.   USE TAPE ON FLOOR TO MARK THE MIDLINE POSITION. You also should try to feel with your limb pressure in socket.  You are trying to feel with limb what you used to feel with the bottom of your foot.  Side to Side Shift: Moving your hips only (not shoulders): move weight onto your left leg, HOLD/FEEL.  Move back to equal weight on each leg, HOLD/FEEL. Move weight onto your right leg, HOLD/FEEL. Move back to equal weight on each leg, HOLD/FEEL. Repeat.  Start with both hands on sink, progress to right hand only, then no hands.  Front to Back Shift: Moving your hips only (not shoulders): move your weight forward onto your toes, HOLD/FEEL. Move your weight back to equal Flat Foot on both legs, HOLD/FEEL. Move your weight back onto your heels, HOLD/FEEL. Move your weight back to equal on both legs, HOLD/FEEL. Repeat.   tart with both hands on sink, progress to right hand only, then no hands. Moving Cones / Cups: With equal weight on each leg: Hold on with one hand the first time, then progress to no hand supports. Move cups from one side of sink to the other. Place cups ~2" out of your reach, progress to 10" beyond reach.  Place one hand in middle of sink and reach with other hand. Do both arms.  Then hover one hand and move cups with other hand. -just use a crossbody reach to end of safe weight shift ROM Overhead/Upward Reaching: alternated reaching up to top cabinets or ceiling if no cabinets present. Keep equal weight on each leg. Start with one hand support on counter while other hand reaches and progress to no hand support with reaching.  ace one hand in middle of sink and reach with other hand. Do both arms.  Then hover one hand and move cups with other hand.  5.   Looking Over Shoulders: With equal weight on each leg: alternate turning to look over your shoulders with one hand support on counter as  needed.  Start with head motions only to look in front of shoulder, then even with shoulder and progress to looking behind you. To look to side, move head /eyes, then shoulder on side looking pulls back, shift more weight to side looking and pull hip back. ace one hand in middle of sink and let go with other hand so your shoulder can pull back. Switch hands to look other way.   Then hover one hand and move cups with other hand.  6.  Stepping with leg that is not amputated:  Move items under cabinet out of your way. Shift your hips/pelvis so weight on prosthesis. SLOWLY step other leg so front of foot is in cabinet. Then step back to floor. - just hike the hip and place the foot back in the starting spot  GOALS: Goals reviewed with patient? Yes  SHORT TERM GOALS: Target date: 05/15/23  Pt will be independent with initial HEP for improved gait mechanics  and balance  Baseline: to be updated, reports she has not been doing at home doesn't trust CNA Goal status: NOT MET  2.  Patient will ambulate at least 12ft with LRAD and no more than MinA to demonstrate improved mobility  Baseline: 66ft // bars + MinA, 10 feet with 2WW + minA Goal status: NOT met  3.  Patient will complete sit <> stand with LRAD and no more than CGA for improved independence with transfers Baseline: MinA, required minA in today's session  Goal status: NOT met - required minA due to posterior instability  4.  Patient will complete floor transfer with assist as initial step of fall recovery  Baseline: unable to complete on eval; MinA Goal status: MET   LONG TERM GOALS: Target date: 07/10/23  Pt will be independent with final HEP for improved gait mechanics and balance  Baseline: to be updated; provided Goal status: MET  Patient will ambulate at least 33ft with LRAD, ModI to demonstrate improved mobility  Baseline: 70ft // bars + MinA; 115' with supervision  Goal status: NOT MET  Patient will complete sit <> stand with  LRAD. SBA for improved independence with transfers Baseline: MinA; CGA Goal status: NOT MET  Patient will complete floor transfer without assist as initial step of fall recovery  Baseline: unable to complete on eval; MinA Goal status: NOT MET  5. Patient will don/doff prosthetics independently   Baseline: MaxA, independent   Goal status: MET   NEW SHORT TERM GOALS: 08/07/23 1.Pt will be independent with new initial HEP for improved gait mechanics and balance  Baseline: to be updated; provided; has not gone back to doing sink HEP, CNA has been off for holiday (6/20) Goal status: NOT MET  2. Patient will ambulate at least 165ft with LRAD, supervision to demonstrate improved mobility  Baseline: 23ft // bars + MinA; 115' with supervision; 56' 2WW CGA (6/20) Goal status:  NOT MET  3.Patient will complete sit <> stand with LRAD ModI for improved independence with transfers Baseline: CGA; min-modA to 2WW (6/20) Goal status: NOT MET  4. Patient will complete floor transfer without assist as initial step of fall recovery  Baseline:  MinA; MinA from 6 step Goal status: NOT MET  NEW LONG TERM GOALS: 09/28/23  1. Pt will be independent with final HEP for improved functional strength and mobility  Baseline: to be updated Goal status: NEW  2. Patient will ambulate 23ft using LRAD and supervision for improved limited community access Baseline: 115' with supervision/CGA + RW Goal status: NEW  3. Patient will complete standing dynamic task ModI for at least 5 mins for greater independence with ADLs Baseline: ~46mins with CGA Goal status: NEW     ASSESSMENT:  CLINICAL IMPRESSION: Patient seen for skilled PT session with emphasis on progressing gait training with her personal walker. It is a bariatric walker, so it does require her arms to be slightly more abducted when ambulating. This does allow for improved clearance of her stubbies due to the need to circumduct them. Patient  progressing very well with static and dynamic balance tasks on stubbies with simulated sink task for carryover to MRADLs. She has an appt with [employer] on 7/14 to determine next steps for her prosthetics. Continue POC.   OBJECTIVE IMPAIRMENTS: Abnormal gait, decreased activity tolerance, decreased balance, decreased endurance, decreased knowledge of condition, decreased knowledge of use of DME, decreased strength, impaired vision/preception, and prosthetic dependency .   ACTIVITY LIMITATIONS: carrying, lifting, standing, squatting, transfers, hygiene/grooming,  locomotion level, and caring for others  PARTICIPATION LIMITATIONS: meal prep, cleaning, interpersonal relationship, driving, shopping, community activity, and occupation  PERSONAL FACTORS: Age, Behavior pattern, Past/current experiences, Social background, Time since onset of injury/illness/exacerbation, Transportation, and 3+ comorbidities: see above are also affecting patient's functional outcome.   REHAB POTENTIAL: Good  CLINICAL DECISION MAKING: Stable/uncomplicated  EVALUATION COMPLEXITY: Low  PLAN:  PT FREQUENCY: 2x/week  PT DURATION: 12 weeks  PLANNED INTERVENTIONS: 97164- PT Re-evaluation, 97110-Therapeutic exercises, 97530- Therapeutic activity, 97112- Neuromuscular re-education, 97535- Self Care, [id number]- Manual therapy, [phone]- Gait training, [phone]- Prosthetic training, [phone]- Aquatic Therapy, Patient/Family education, Balance training, Stair training, Scar mobilization, Vestibular training, Visual/preceptual remediation/compensation, DME instructions, and Wheelchair mobility training  PLAN FOR NEXT SESSION: Gait, balance, floor transfers  how is modified sink HEP?, balance recovery in standing/static stability w/ dec UE reliance, walking, dynamic/standing balance, balance recovery, tricep strength, hip extension/abd strength/pelvic mobility  [facility] - Dr. Kelley nurse 6/13 phone call:  pt has no precautions post-op and is  able to wear an abdominal binder for lateral abdominal support as needed  can home nurse come in for transfer/gait training?- she's going to ask if she is allowed to come as it will be outside her set hours  May need to call [employer] and determine plan for elevating prosthetics to determine ongoing PT POC.- going 7/14 and will update POC then (pt aware)   [name], PT [name], PT, DPT, CBIS  08/19/2023, 7:35 AM

## 2023-08-25 ENCOUNTER — Encounter: Payer: Self-pay | Admitting: Physical Therapy

## 2023-08-25 ENCOUNTER — Ambulatory Visit: Admitting: Physical Therapy

## 2023-08-25 DIAGNOSIS — M6281 Muscle weakness (generalized): Secondary | ICD-10-CM | POA: Diagnosis not present

## 2023-08-25 DIAGNOSIS — R293 Abnormal posture: Secondary | ICD-10-CM

## 2023-08-25 DIAGNOSIS — R29898 Other symptoms and signs involving the musculoskeletal system: Secondary | ICD-10-CM

## 2023-08-25 DIAGNOSIS — R2689 Other abnormalities of gait and mobility: Secondary | ICD-10-CM

## 2023-08-25 NOTE — Therapy (Unsigned)
 OUTPATIENT PHYSICAL THERAPY NEURO TREATMENT   Patient Name: Kelli Hudson MRN: [id number] DOB:04/12/1990, 33 y.o., female Today's Date: 08/25/2023   PCP: Daphne Lesches, NP REFERRING PROVIDER: Jerona Sage, MD  END OF SESSION:  PT End of Session - 08/25/23 1630     Visit Number 32    Number of Visits 49    Date for PT Re-Evaluation 10/02/23    Authorization Type Medicare/Medicaid    Progress Note Due on Visit 40    PT Start Time 1621    PT Stop Time 1705    PT Time Calculation (min) 44 min    Equipment Utilized During Treatment Gait belt    Activity Tolerance Patient tolerated treatment well    Behavior During Therapy WFL for tasks assessed/performed          Past Medical History:  Diagnosis Date   Acute H. pylori gastric ulcer    Coffee ground emesis    Diabetes mellitus (HCC)    Diabetic gastroparesis (HCC)    DKA (diabetic ketoacidosis) (HCC) 02/24/2021   Gastroparesis    GERD (gastroesophageal reflux disease)    Hypertension    Hyperthyroidism    Intractable nausea and vomiting 04/20/2021   Normocytic anemia 04/20/2020   Prolonged Q-T interval on ECG    Past Surgical History:  Procedure Laterality Date   AMPUTATION Bilateral 10/24/2022   Procedure: BILATERAL BELOW KNEE AMPUTATION;  Surgeon: Sage Jerona GAILS, MD;  Location: [facility] OR;  Service: Orthopedics;  Laterality: Bilateral;   AMPUTATION Bilateral 10/08/2022   Procedure: BILATERAL LEG DEBRIDEMENT;  Surgeon: Sage Jerona GAILS, MD;  Location: [facility] OR;  Service: Orthopedics;  Laterality: Bilateral;   AMPUTATION Bilateral 11/14/2022   Procedure: BILATERAL ABOVE KNEE AMPUTATION;  Surgeon: Sage Jerona GAILS, MD;  Location: [facility] OR;  Service: Orthopedics;  Laterality: Bilateral;   AMPUTATION TOE Left 03/10/2018   Procedure: AMPUTATION FIFTH TOE;  Surgeon: Gershon Donnice SAUNDERS, DPM;  Location: [facility];  Service: Podiatry;  Laterality: Left;   APPLICATION OF WOUND VAC Bilateral 10/12/2022   Procedure:  APPLICATION OF WOUND VAC BILATERAL LEGS;  Surgeon: Celena Sharper, MD;  Location: [facility] OR;  Service: Orthopedics;  Laterality: Bilateral;   APPLICATION OF WOUND VAC Bilateral 10/24/2022   Procedure: APPLICATION OF WOUND VAC;  Surgeon: Sage Jerona GAILS, MD;  Location: [facility] OR;  Service: Orthopedics;  Laterality: Bilateral;   BIOPSY  01/28/2020   Procedure: BIOPSY;  Surgeon: Elicia Claw, MD;  Location: [facility];  Service: Gastroenterology;;   BOTOX  INJECTION  08/26/2021   Procedure: BOTOX  INJECTION;  Surgeon: Legrand Victory LITTIE DOUGLAS, MD;  Location: [facility];  Service: Gastroenterology;;   ESOPHAGOGASTRODUODENOSCOPY N/A 01/28/2020   Procedure: ESOPHAGOGASTRODUODENOSCOPY (EGD);  Surgeon: Elicia Claw, MD;  Location: [facility];  Service: Gastroenterology;  Laterality: N/A;   ESOPHAGOGASTRODUODENOSCOPY (EGD) WITH PROPOFOL  Left 09/08/2015   Procedure: ESOPHAGOGASTRODUODENOSCOPY (EGD) WITH PROPOFOL ;  Surgeon: Elsie Cree, MD;  Location: [facility] END[facility]COPY;  Service: Endoscopy;  Laterality: Left;   ESOPHAGOGASTRODUODENOSCOPY (EGD) WITH PROPOFOL  N/A 08/26/2021   Procedure: ESOPHAGOGASTRODUODENOSCOPY (EGD) WITH PROPOFOL ;  Surgeon: Legrand Victory LITTIE DOUGLAS, MD;  Location: [facility];  Service: Gastroenterology;  Laterality: N/A;   GASTRIC STIMULATOR IMPLANT SURGERY  06/2022   at [facility]   I & D EXTREMITY Bilateral 10/12/2022   Procedure: IRRIGATION AND  DEBRIDEMENT  BILATERAL LEGS;  Surgeon: Celena Sharper, MD;  Location: [facility] OR;  Service: Orthopedics;  Laterality: Bilateral;   I & D EXTREMITY Right 10/13/2022   Procedure: RIGHT THIGH WOUND VAC EXCHANGE;  Surgeon: Kendal Franky SQUIBB, MD;  Location: [facility] OR;  Service: Orthopedics;  Laterality: Right;   I & D EXTREMITY Bilateral 10/17/2022   Procedure: BILATERAL THIGH AND LEG DEBRIDEMENT, PARTIAL BILATERAL FOOT AMPUTATION;  Surgeon: Harden Jerona GAILS, MD;  Location: [facility] OR;  Service: Orthopedics;  Laterality: Bilateral;   I & D EXTREMITY Bilateral 11/05/2022   Procedure:  BILATERAL THIGH DEBRIDEMENT;  Surgeon: Harden Jerona GAILS, MD;  Location: [facility] OR;  Service: Orthopedics;  Laterality: Bilateral;   I & D EXTREMITY Bilateral 11/07/2022   Procedure: BILATERAL THIGH AND LEG DEBRIDEMENT;  Surgeon: Harden Jerona GAILS, MD;  Location: [facility] OR;  Service: Orthopedics;  Laterality: Bilateral;   I & D EXTREMITY Bilateral 11/12/2022   Procedure: BILATERAL LEG DEBRIDEMENTS;  Surgeon: Harden Jerona GAILS, MD;  Location: [facility] OR;  Service: Orthopedics;  Laterality: Bilateral;   IRRIGATION AND DEBRIDEMENT FOOT Bilateral 10/05/2022   Procedure: IRRIGATION AND DEBRIDEMENT FOOT;  Surgeon: Janit Thresa HERO, DPM;  Location: [facility];  Service: Orthopedics/Podiatry;  Laterality: Bilateral;   WISDOM TOOTH EXTRACTION     Patient Active Problem List   Diagnosis Date Noted   Influenza A 04/05/2023   Elevated lipase 04/05/2023   Esophagitis 03/24/2023   SIRS (systemic inflammatory response syndrome) (HCC) 03/02/2023   Fever 03/02/2023   Metabolic acidosis 03/02/2023   Hypertensive urgency 02/21/2023   Chronic pain 02/21/2023   Abscess of thigh 11/28/2022   C. 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   Hypokalemia 11/27/2022   Effusion, left knee 11/05/2022   MDD (major depressive disorder), recurrent episode, moderate (HCC) 10/16/2022   Necrotizing fasciitis of pelvic region and thigh (HCC) 10/08/2022   Necrotizing fasciitis of lower leg (HCC) 10/08/2022   Necrotizing fasciitis (HCC) 10/08/2022   Sepsis due to cellulitis (HCC) 10/03/2022   Cellulitis of lower extremity 10/03/2022   Multinodular goiter 06/18/2022   Malnutrition of moderate degree 04/18/2022   Intractable vomiting with nausea 04/17/2022   DKA (diabetic ketoacidosis) (HCC) 03/04/2022   Nausea vomiting and diarrhea 12/25/2021   Diabetic gastroparesis (HCC) 10/09/2021   Drug-seeking behavior 09/21/2021   Essential hypertension 08/25/2021   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) 08/25/2021   GERD without esophagitis 08/25/2021   Hematemesis    Prolonged Q-T interval on ECG    Nausea and vomiting 07/20/2021   Anemia of chronic disease 06/06/2021   Dehydration 06/04/2021   Hypomagnesemia 04/21/2021   Ulcerated, foot, left, with fat layer exposed (HCC) 04/20/2021   Uncontrolled type 1 diabetes mellitus with hyperglycemia, with long-term current use of insulin  (HCC) 12/18/2020   DM type 1 (diabetes mellitus, type 1) (HCC) 12/18/2020   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) 11/09/2020   Diabetic retinopathy of both eyes associated with type 1 diabetes mellitus (HCC) 09/24/2020   Diabetic gastroparesis associated with type 1 diabetes mellitus (HCC) 08/12/2020   Acute kidney injury superimposed on chronic kidney disease (HCC) 07/25/2020   Generalized abdominal pain 07/23/2020   GERD (gastroesophageal reflux disease) 07/23/2020   Diabetic nephropathy associated with type 1 diabetes mellitus (HCC) 06/27/2020   Sepsis (HCC) 05/27/2020   HLD (hyperlipidemia) 05/23/2020   Sinus tachycardia 05/09/2020   Anemia 04/20/2020   Depression with anxiety 07/05/2019   Diabetic polyneuropathy associated with type 1 diabetes mellitus (HCC) 09/24/2017   Gastroparesis    Goiter 10/05/2009   DM2 (diabetes mellitus, type 2) (HCC) 10/05/2009   Hypertension associated with diabetes (HCC) 10/05/2009  ONSET DATE: 04/07/23 referral  REFERRING DIAG: [id number] (ICD-10-CM) - S/P AKA (above knee amputation) bilateral (HCC)   THERAPY DIAG:  Muscle weakness (generalized)  Other abnormalities of gait and mobility  Abnormal posture  Other symptoms and signs involving the musculoskeletal system  Rationale for Evaluation and Treatment: Rehabilitation  SUBJECTIVE:                                                                                                                                                                                              SUBJECTIVE STATEMENT: Patient reports she is having a hard vision day today and attributes this to the sun.  She does not feel safe walking this visit due to vision difficulty.  She did try to stand with CNA but this was hard from her couch.  Has not heard anything from [employer].  Brought in her walker today. Denies falls.  Blood sugar is 124 at onset of session, but she is worried it may crash.  Pt accompanied by: friend - Drew  PERTINENT HISTORY: DM, diabetic gastroparesis, GERD, HTN, hyperthyroidism  PAIN:  Are you having pain? No  Blood sugar at onset of session (sensor on LRQ):  316 - she administered insulin  at 930am; no reported adverse symptoms   PRECAUTIONS: Fall; B diabetic retinopathy  PATIENT GOALS: to be able to walk again                                                                                                                              TREATMENT Standing balance at walker:  -time spent adjusting prosthetics minA for positioning w/ single posterior LOB to edge of wheelchair, able to return to upright and complete adjustment to improve BOS  -A/P weight shifting > lateral weight shifting in diminished ROM w/o UE support CGA  -Forward and upright leaning no UE support CGA  -Second stand w/ time spent adjusting at CGA level w/ mod cues to improve left prosthetic placement  -Standing normal BOS for forward reach and hold, initiated single reach and hold x3 each side progressing to BUE  reach and hold x3 seconds on initial round CGA progressed to x5 seconds on second round SBA-CGA  -Alternating overhead reach w/o UE support SBA-CGA several reps to fatigue, good slow righting response  -Forward and retro stepping w/ minA x4 reps each direction using 2WW, small stride today w/ narrow BOS  Blood sugar at end of session 116  PATIENT EDUCATION: Education details: continue HEP, continue trying to stand at home Person educated: Patient and friend Education method:  Explanation and Demonstration Education comprehension: verbalized understanding and needs further education  HOME EXERCISE PROGRAM: -practice donning/doffing prosthetic Access Code: [email or web address] URL: [email or web address] Date: 04/20/2023 Prepared by: Delon Pop  Exercises - Prone Hip Extension with Residual Limb (BKA)  - 1 x daily - 7 x weekly - 3 sets - 10 reps - Sidelying Hip Abduction (AKA)  - 1 x daily - 7 x weekly - 3 sets - 10 reps - Supine Piriformis Stretch with Residual Limb (AKA)  - 1 x daily - 7 x weekly - 3 sets - 10 reps  Do each exercise 1-2  times per day Do each exercise 10 repetitions Hold each exercise for 2 seconds to feel your location  AT SINK FIND YOUR MIDLINE POSITION AND PLACE FEET EQUAL DISTANCE FROM THE MIDLINE.  Try to find this position when standing still for activities.   USE TAPE ON FLOOR TO MARK THE MIDLINE POSITION. You also should try to feel with your limb pressure in socket.  You are trying to feel with limb what you used to feel with the bottom of your foot.  Side to Side Shift: Moving your hips only (not shoulders): move weight onto your left leg, HOLD/FEEL.  Move back to equal weight on each leg, HOLD/FEEL. Move weight onto your right leg, HOLD/FEEL. Move back to equal weight on each leg, HOLD/FEEL. Repeat.  Start with both hands on sink, progress to right hand only, then no hands.  Front to Back Shift: Moving your hips only (not shoulders): move your weight forward onto your toes, HOLD/FEEL. Move your weight back to equal Flat Foot on both legs, HOLD/FEEL. Move your weight back onto your heels, HOLD/FEEL. Move your weight back to equal on both legs, HOLD/FEEL. Repeat.   tart with both hands on sink, progress to right hand only, then no hands. Moving Cones / Cups: With equal weight on each leg: Hold on with one hand the first time, then progress to no hand supports. Move cups from one side of sink to the other. Place cups ~2" out of your  reach, progress to 10" beyond reach.  Place one hand in middle of sink and reach with other hand. Do both arms.  Then hover one hand and move cups with other hand. -just use a crossbody reach to end of safe weight shift ROM Overhead/Upward Reaching: alternated reaching up to top cabinets or ceiling if no cabinets present. Keep equal weight on each leg. Start with one hand support on counter while other hand reaches and progress to no hand support with reaching.  ace one hand in middle of sink and reach with other hand. Do both arms.  Then hover one hand and move cups with other hand.  5.   Looking Over Shoulders: With equal weight on each leg: alternate turning to look over your shoulders with one hand support on counter as needed.  Start with head motions only to look in front of shoulder, then even with shoulder and progress to looking behind you. To look  to side, move head /eyes, then shoulder on side looking pulls back, shift more weight to side looking and pull hip back. ace one hand in middle of sink and let go with other hand so your shoulder can pull back. Switch hands to look other way.   Then hover one hand and move cups with other hand.  6.  Stepping with leg that is not amputated:  Move items under cabinet out of your way. Shift your hips/pelvis so weight on prosthesis. SLOWLY step other leg so front of foot is in cabinet. Then step back to floor. - just hike the hip and place the foot back in the starting spot  GOALS: Goals reviewed with patient? Yes  SHORT TERM GOALS: Target date: 05/15/23  Pt will be independent with initial HEP for improved gait mechanics and balance  Baseline: to be updated, reports she has not been doing at home doesn't trust CNA Goal status: NOT MET  2.  Patient will ambulate at least 83ft with LRAD and no more than MinA to demonstrate improved mobility  Baseline: 25ft // bars + MinA, 10 feet with 2WW + minA Goal status: NOT met  3.  Patient will complete sit <>  stand with LRAD and no more than CGA for improved independence with transfers Baseline: MinA, required minA in today's session  Goal status: NOT met - required minA due to posterior instability  4.  Patient will complete floor transfer with assist as initial step of fall recovery  Baseline: unable to complete on eval; MinA Goal status: MET   LONG TERM GOALS: Target date: 07/10/23  Pt will be independent with final HEP for improved gait mechanics and balance  Baseline: to be updated; provided Goal status: MET  Patient will ambulate at least 26ft with LRAD, ModI to demonstrate improved mobility  Baseline: 26ft // bars + MinA; 115' with supervision  Goal status: NOT MET  Patient will complete sit <> stand with LRAD. SBA for improved independence with transfers Baseline: MinA; CGA Goal status: NOT MET  Patient will complete floor transfer without assist as initial step of fall recovery  Baseline: unable to complete on eval; MinA Goal status: NOT MET  5. Patient will don/doff prosthetics independently   Baseline: MaxA, independent   Goal status: MET   NEW SHORT TERM GOALS: 08/07/23 1.Pt will be independent with new initial HEP for improved gait mechanics and balance  Baseline: to be updated; provided; has not gone back to doing sink HEP, CNA has been off for holiday (6/20) Goal status: NOT MET  2. Patient will ambulate at least 153ft with LRAD, supervision to demonstrate improved mobility  Baseline: 16ft // bars + MinA; 115' with supervision; 56' 2WW CGA (6/20) Goal status:  NOT MET  3.Patient will complete sit <> stand with LRAD ModI for improved independence with transfers Baseline: CGA; min-modA to 2WW (6/20) Goal status: NOT MET  4. Patient will complete floor transfer without assist as initial step of fall recovery  Baseline:  MinA; MinA from 6 step Goal status: NOT MET  NEW LONG TERM GOALS: 09/28/23  1. Pt will be independent with final HEP for improved functional  strength and mobility  Baseline: to be updated Goal status: NEW  2. Patient will ambulate 212ft using LRAD and supervision for improved limited community access Baseline: 115' with supervision/CGA + RW Goal status: NEW  3. Patient will complete standing dynamic task ModI for at least 5 mins for greater independence with ADLs Baseline: ~93mins  with CGA Goal status: NEW     ASSESSMENT:  CLINICAL IMPRESSION: Patient seen for skilled PT session with emphasis standing balance due to visual blurriness today.  This is not an acute change for the patient and she is being followed by her eye doctor due to progressive nature of this concern.  She had more difficulty with advancing and appropriately placing the BLE this visit.  Her forward and overhead reaching unsupported continues to improve when BOS adjusted appropriately.  She had concerns about her blood sugar at onset of session without any significant change by end of session. Continue POC.   OBJECTIVE IMPAIRMENTS: Abnormal gait, decreased activity tolerance, decreased balance, decreased endurance, decreased knowledge of condition, decreased knowledge of use of DME, decreased strength, impaired vision/preception, and prosthetic dependency .   ACTIVITY LIMITATIONS: carrying, lifting, standing, squatting, transfers, hygiene/grooming, locomotion level, and caring for others  PARTICIPATION LIMITATIONS: meal prep, cleaning, interpersonal relationship, driving, shopping, community activity, and occupation  PERSONAL FACTORS: Age, Behavior pattern, Past/current experiences, Social background, Time since onset of injury/illness/exacerbation, Transportation, and 3+ comorbidities: see above are also affecting patient's functional outcome.   REHAB POTENTIAL: Good  CLINICAL DECISION MAKING: Stable/uncomplicated  EVALUATION COMPLEXITY: Low  PLAN:  PT FREQUENCY: 2x/week  PT DURATION: 12 weeks  PLANNED INTERVENTIONS: 97164- PT Re-evaluation,  97110-Therapeutic exercises, 97530- Therapeutic activity, 97112- Neuromuscular re-education, 97535- Self Care, [id number]- Manual therapy, [phone]- Gait training, [phone]- Prosthetic training, [phone]- Aquatic Therapy, Patient/Family education, Balance training, Stair training, Scar mobilization, Vestibular training, Visual/preceptual remediation/compensation, DME instructions, and Wheelchair mobility training  PLAN FOR NEXT SESSION: Gait, balance, floor transfers  how is modified sink HEP?, balance recovery in standing/static stability w/ dec UE reliance, walking, dynamic/standing balance, balance recovery, tricep strength, hip extension/abd strength/pelvic mobility  [facility] - Dr. Kelley nurse 6/13 phone call:  pt has no precautions post-op and is able to wear an abdominal binder for lateral abdominal support as needed  can home nurse come in for transfer/gait training?- she's going to ask if she is allowed to come as it will be outside her set hours  May need to call [employer] and determine plan for elevating prosthetics to determine ongoing PT POC.- going 7/14 and will update POC then (pt aware)   Daved KATHEE Bull, PT, DPT  08/25/2023, 5:06 PM

## 2023-08-28 ENCOUNTER — Ambulatory Visit

## 2023-08-28 DIAGNOSIS — M6281 Muscle weakness (generalized): Secondary | ICD-10-CM | POA: Diagnosis not present

## 2023-08-28 DIAGNOSIS — R2689 Other abnormalities of gait and mobility: Secondary | ICD-10-CM

## 2023-08-28 DIAGNOSIS — R293 Abnormal posture: Secondary | ICD-10-CM

## 2023-08-28 NOTE — Therapy (Signed)
 OUTPATIENT PHYSICAL THERAPY NEURO TREATMENT   Patient Name: Kelli Hudson MRN: [id number] DOB:Dec 11, 1990, 33 y.o., female Today's Date: 08/28/2023   PCP: Daphne Lesches, NP REFERRING PROVIDER: Jerona Sage, MD  END OF SESSION:  PT End of Session - 08/28/23 1319     Visit Number 33    Number of Visits 49    Date for PT Re-Evaluation 10/02/23    Authorization Type Medicare/Medicaid    Progress Note Due on Visit 40    PT Start Time 1316    PT Stop Time 1400    PT Time Calculation (min) 44 min    Equipment Utilized During Treatment Gait belt    Activity Tolerance Patient tolerated treatment well    Behavior During Therapy WFL for tasks assessed/performed          Past Medical History:  Diagnosis Date   Acute H. pylori gastric ulcer    Coffee ground emesis    Diabetes mellitus (HCC)    Diabetic gastroparesis (HCC)    DKA (diabetic ketoacidosis) (HCC) 02/24/2021   Gastroparesis    GERD (gastroesophageal reflux disease)    Hypertension    Hyperthyroidism    Intractable nausea and vomiting 04/20/2021   Normocytic anemia 04/20/2020   Prolonged Q-T interval on ECG    Past Surgical History:  Procedure Laterality Date   AMPUTATION Bilateral 10/24/2022   Procedure: BILATERAL BELOW KNEE AMPUTATION;  Surgeon: Sage Jerona GAILS, MD;  Location: [facility] OR;  Service: Orthopedics;  Laterality: Bilateral;   AMPUTATION Bilateral 10/08/2022   Procedure: BILATERAL LEG DEBRIDEMENT;  Surgeon: Sage Jerona GAILS, MD;  Location: [facility] OR;  Service: Orthopedics;  Laterality: Bilateral;   AMPUTATION Bilateral 11/14/2022   Procedure: BILATERAL ABOVE KNEE AMPUTATION;  Surgeon: Sage Jerona GAILS, MD;  Location: [facility] OR;  Service: Orthopedics;  Laterality: Bilateral;   AMPUTATION TOE Left 03/10/2018   Procedure: AMPUTATION FIFTH TOE;  Surgeon: Gershon Donnice SAUNDERS, DPM;  Location: [facility];  Service: Podiatry;  Laterality: Left;   APPLICATION OF WOUND VAC Bilateral 10/12/2022   Procedure:  APPLICATION OF WOUND VAC BILATERAL LEGS;  Surgeon: Celena Sharper, MD;  Location: [facility] OR;  Service: Orthopedics;  Laterality: Bilateral;   APPLICATION OF WOUND VAC Bilateral 10/24/2022   Procedure: APPLICATION OF WOUND VAC;  Surgeon: Sage Jerona GAILS, MD;  Location: [facility] OR;  Service: Orthopedics;  Laterality: Bilateral;   BIOPSY  01/28/2020   Procedure: BIOPSY;  Surgeon: Elicia Claw, MD;  Location: [facility];  Service: Gastroenterology;;   BOTOX  INJECTION  08/26/2021   Procedure: BOTOX  INJECTION;  Surgeon: Legrand Victory LITTIE DOUGLAS, MD;  Location: [facility];  Service: Gastroenterology;;   ESOPHAGOGASTRODUODENOSCOPY N/A 01/28/2020   Procedure: ESOPHAGOGASTRODUODENOSCOPY (EGD);  Surgeon: Elicia Claw, MD;  Location: [facility];  Service: Gastroenterology;  Laterality: N/A;   ESOPHAGOGASTRODUODENOSCOPY (EGD) WITH PROPOFOL  Left 09/08/2015   Procedure: ESOPHAGOGASTRODUODENOSCOPY (EGD) WITH PROPOFOL ;  Surgeon: Elsie Cree, MD;  Location: [facility];  Service: Endoscopy;  Laterality: Left;   ESOPHAGOGASTRODUODENOSCOPY (EGD) WITH PROPOFOL  N/A 08/26/2021   Procedure: ESOPHAGOGASTRODUODENOSCOPY (EGD) WITH PROPOFOL ;  Surgeon: Legrand Victory LITTIE DOUGLAS, MD;  Location: [facility];  Service: Gastroenterology;  Laterality: N/A;   GASTRIC STIMULATOR IMPLANT SURGERY  06/2022   at [facility]   I & D EXTREMITY Bilateral 10/12/2022   Procedure: IRRIGATION AND  DEBRIDEMENT  BILATERAL LEGS;  Surgeon: Celena Sharper, MD;  Location: [facility] OR;  Service: Orthopedics;  Laterality: Bilateral;   I & D EXTREMITY Right 10/13/2022   Procedure: RIGHT THIGH WOUND VAC EXCHANGE;  Surgeon: Kendal Franky SQUIBB, MD;  Location: [facility] OR;  Service: Orthopedics;  Laterality: Right;   I & D EXTREMITY Bilateral 10/17/2022   Procedure: BILATERAL THIGH AND LEG DEBRIDEMENT, PARTIAL BILATERAL FOOT AMPUTATION;  Surgeon: Harden Jerona GAILS, MD;  Location: [facility] OR;  Service: Orthopedics;  Laterality: Bilateral;   I & D EXTREMITY Bilateral 11/05/2022   Procedure:  BILATERAL THIGH DEBRIDEMENT;  Surgeon: Harden Jerona GAILS, MD;  Location: [facility] OR;  Service: Orthopedics;  Laterality: Bilateral;   I & D EXTREMITY Bilateral 11/07/2022   Procedure: BILATERAL THIGH AND LEG DEBRIDEMENT;  Surgeon: Harden Jerona GAILS, MD;  Location: [facility] OR;  Service: Orthopedics;  Laterality: Bilateral;   I & D EXTREMITY Bilateral 11/12/2022   Procedure: BILATERAL LEG DEBRIDEMENTS;  Surgeon: Harden Jerona GAILS, MD;  Location: [facility] OR;  Service: Orthopedics;  Laterality: Bilateral;   IRRIGATION AND DEBRIDEMENT FOOT Bilateral 10/05/2022   Procedure: IRRIGATION AND DEBRIDEMENT FOOT;  Surgeon: Janit Thresa HERO, DPM;  Location: [facility];  Service: Orthopedics/Podiatry;  Laterality: Bilateral;   WISDOM TOOTH EXTRACTION     Patient Active Problem List   Diagnosis Date Noted   Influenza A 04/05/2023   Elevated lipase 04/05/2023   Esophagitis 03/24/2023   SIRS (systemic inflammatory response syndrome) (HCC) 03/02/2023   Fever 03/02/2023   Metabolic acidosis 03/02/2023   Hypertensive urgency 02/21/2023   Chronic pain 02/21/2023   Abscess of thigh 11/28/2022   C. 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   Hypokalemia 11/27/2022   Effusion, left knee 11/05/2022   MDD (major depressive disorder), recurrent episode, moderate (HCC) 10/16/2022   Necrotizing fasciitis of pelvic region and thigh (HCC) 10/08/2022   Necrotizing fasciitis of lower leg (HCC) 10/08/2022   Necrotizing fasciitis (HCC) 10/08/2022   Sepsis due to cellulitis (HCC) 10/03/2022   Cellulitis of lower extremity 10/03/2022   Multinodular goiter 06/18/2022   Malnutrition of moderate degree 04/18/2022   Intractable vomiting with nausea 04/17/2022   DKA (diabetic ketoacidosis) (HCC) 03/04/2022   Nausea vomiting and diarrhea 12/25/2021   Diabetic gastroparesis (HCC) 10/09/2021   Drug-seeking behavior 09/21/2021   Essential hypertension 08/25/2021   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) 08/25/2021   GERD without esophagitis 08/25/2021   Hematemesis    Prolonged Q-T interval on ECG    Nausea and vomiting 07/20/2021   Anemia of chronic disease 06/06/2021   Dehydration 06/04/2021   Hypomagnesemia 04/21/2021   Ulcerated, foot, left, with fat layer exposed (HCC) 04/20/2021   Uncontrolled type 1 diabetes mellitus with hyperglycemia, with long-term current use of insulin  (HCC) 12/18/2020   DM type 1 (diabetes mellitus, type 1) (HCC) 12/18/2020   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) 11/09/2020   Diabetic retinopathy of both eyes associated with type 1 diabetes mellitus (HCC) 09/24/2020   Diabetic gastroparesis associated with type 1 diabetes mellitus (HCC) 08/12/2020   Acute kidney injury superimposed on chronic kidney disease (HCC) 07/25/2020   Generalized abdominal pain 07/23/2020   GERD (gastroesophageal reflux disease) 07/23/2020   Diabetic nephropathy associated with type 1 diabetes mellitus (HCC) 06/27/2020   Sepsis (HCC) 05/27/2020   HLD (hyperlipidemia) 05/23/2020   Sinus tachycardia 05/09/2020   Anemia 04/20/2020   Depression with anxiety 07/05/2019   Diabetic polyneuropathy associated with type 1 diabetes mellitus (HCC) 09/24/2017   Gastroparesis    Goiter 10/05/2009   DM2 (diabetes mellitus, type 2) (HCC) 10/05/2009   Hypertension associated with diabetes (HCC) 10/05/2009  ONSET DATE: 04/07/23 referral  REFERRING DIAG: [phone] (ICD-10-CM) - S/P AKA (above knee amputation) bilateral (HCC)   THERAPY DIAG:  Muscle weakness (generalized)  Other abnormalities of gait and mobility  Abnormal posture  Rationale for Evaluation and Treatment: Rehabilitation  SUBJECTIVE:                                                                                                                                                                                             SUBJECTIVE STATEMENT: Patient reports doing well. States CNA  can come to Tuesday appt to practice standing/walking. Reports R eye vision has improved slightly and goes to the eye dr by the end of the month. Goes to [place] on Monday. Denies falls.   Pt accompanied by: friend - Drew  PERTINENT HISTORY: DM, diabetic gastroparesis, GERD, HTN, hyperthyroidism  PAIN:  Are you having pain? No  Blood sugar at onset of session (sensor on LRQ):  316 - she administered insulin  at 930am; no reported adverse symptoms   PRECAUTIONS: Fall; B diabetic retinopathy  PATIENT GOALS: to be able to walk again                                                                                                                              TREATMENT Gait: -115' total with 1 seated rest break   -emphasis on fluidity of gait and more abducted L LE  Theract: -blocked practice sit <> stand with emphasis on immediate standing balance and L LE abduction   PATIENT EDUCATION: Education details: continue HEP, continue trying to stand at home, PT POC Person educated: Patient and friend Education method: Explanation and Demonstration Education comprehension: verbalized understanding and needs further education  HOME EXERCISE PROGRAM: -practice donning/doffing prosthetic Access Code: [email or web address] URL: [email or web address] Date: 04/20/2023 Prepared by: Delon Pop  Exercises - Prone Hip Extension with Residual Limb (BKA)  - 1 x daily - 7 x weekly - 3 sets - 10 reps - Sidelying Hip Abduction (AKA)  - 1 x daily - 7 x weekly - 3 sets - 10 reps - Supine  Piriformis Stretch with Residual Limb (AKA)  - 1 x daily - 7 x weekly - 3 sets - 10 reps  Do each exercise 1-2  times per day Do each exercise 10 repetitions Hold each exercise for 2 seconds to feel your location  AT SINK FIND YOUR MIDLINE POSITION AND PLACE FEET EQUAL DISTANCE FROM THE MIDLINE.  Try to find this position when standing still for activities.   USE TAPE ON FLOOR TO MARK THE MIDLINE  POSITION. You also should try to feel with your limb pressure in socket.  You are trying to feel with limb what you used to feel with the bottom of your foot.  Side to Side Shift: Moving your hips only (not shoulders): move weight onto your left leg, HOLD/FEEL.  Move back to equal weight on each leg, HOLD/FEEL. Move weight onto your right leg, HOLD/FEEL. Move back to equal weight on each leg, HOLD/FEEL. Repeat.  Start with both hands on sink, progress to right hand only, then no hands.  Front to Back Shift: Moving your hips only (not shoulders): move your weight forward onto your toes, HOLD/FEEL. Move your weight back to equal Flat Foot on both legs, HOLD/FEEL. Move your weight back onto your heels, HOLD/FEEL. Move your weight back to equal on both legs, HOLD/FEEL. Repeat.   tart with both hands on sink, progress to right hand only, then no hands. Moving Cones / Cups: With equal weight on each leg: Hold on with one hand the first time, then progress to no hand supports. Move cups from one side of sink to the other. Place cups ~2" out of your reach, progress to 10" beyond reach.  Place one hand in middle of sink and reach with other hand. Do both arms.  Then hover one hand and move cups with other hand. -just use a crossbody reach to end of safe weight shift ROM Overhead/Upward Reaching: alternated reaching up to top cabinets or ceiling if no cabinets present. Keep equal weight on each leg. Start with one hand support on counter while other hand reaches and progress to no hand support with reaching.  ace one hand in middle of sink and reach with other hand. Do both arms.  Then hover one hand and move cups with other hand.  5.   Looking Over Shoulders: With equal weight on each leg: alternate turning to look over your shoulders with one hand support on counter as needed.  Start with head motions only to look in front of shoulder, then even with shoulder and progress to looking behind you. To look to side, move  head /eyes, then shoulder on side looking pulls back, shift more weight to side looking and pull hip back. ace one hand in middle of sink and let go with other hand so your shoulder can pull back. Switch hands to look other way.   Then hover one hand and move cups with other hand.  6.  Stepping with leg that is not amputated:  Move items under cabinet out of your way. Shift your hips/pelvis so weight on prosthesis. SLOWLY step other leg so front of foot is in cabinet. Then step back to floor. - just hike the hip and place the foot back in the starting spot  GOALS: Goals reviewed with patient? Yes  SHORT TERM GOALS: Target date: 05/15/23  Pt will be independent with initial HEP for improved gait mechanics and balance  Baseline: to be updated, reports she has not been doing at home  doesn't trust CNA Goal status: NOT MET  2.  Patient will ambulate at least 23ft with LRAD and no more than MinA to demonstrate improved mobility  Baseline: 8ft // bars + MinA, 10 feet with 2WW + minA Goal status: NOT met  3.  Patient will complete sit <> stand with LRAD and no more than CGA for improved independence with transfers Baseline: MinA, required minA in today's session  Goal status: NOT met - required minA due to posterior instability  4.  Patient will complete floor transfer with assist as initial step of fall recovery  Baseline: unable to complete on eval; MinA Goal status: MET   LONG TERM GOALS: Target date: 07/10/23  Pt will be independent with final HEP for improved gait mechanics and balance  Baseline: to be updated; provided Goal status: MET  Patient will ambulate at least 47ft with LRAD, ModI to demonstrate improved mobility  Baseline: 44ft // bars + MinA; 115' with supervision  Goal status: NOT MET  Patient will complete sit <> stand with LRAD. SBA for improved independence with transfers Baseline: MinA; CGA Goal status: NOT MET  Patient will complete floor transfer without assist as  initial step of fall recovery  Baseline: unable to complete on eval; MinA Goal status: NOT MET  5. Patient will don/doff prosthetics independently   Baseline: MaxA, independent   Goal status: MET   NEW SHORT TERM GOALS: 08/07/23 1.Pt will be independent with new initial HEP for improved gait mechanics and balance  Baseline: to be updated; provided; has not gone back to doing sink HEP, CNA has been off for holiday (6/20) Goal status: NOT MET  2. Patient will ambulate at least 115ft with LRAD, supervision to demonstrate improved mobility  Baseline: 33ft // bars + MinA; 115' with supervision; 56' 2WW CGA (6/20) Goal status:  NOT MET  3.Patient will complete sit <> stand with LRAD ModI for improved independence with transfers Baseline: CGA; min-modA to 2WW (6/20) Goal status: NOT MET  4. Patient will complete floor transfer without assist as initial step of fall recovery  Baseline:  MinA; MinA from 6 step Goal status: NOT MET  NEW LONG TERM GOALS: 09/28/23  1. Pt will be independent with final HEP for improved functional strength and mobility  Baseline: to be updated Goal status: NEW  2. Patient will ambulate 219ft using LRAD and supervision for improved limited community access Baseline: 115' with supervision/CGA + RW Goal status: NEW  3. Patient will complete standing dynamic task ModI for at least 5 mins for greater independence with ADLs Baseline: ~46mins with CGA Goal status: NEW     ASSESSMENT:  CLINICAL IMPRESSION: Patient seen for skilled PT session with emphasis on progressing gait retraining and standing balance. Responsive to cues for more continuous/reciprocal stepping during gait. Noted improvements in speed and balance with this as opposed to previous start/stop type gait pattern. Tendency for L LE to adduct both when standing and during gait. Blocked practice with good results and carryover emphasizing appropriate L LE abduction for improved immediate standing  balance. Continue POC.   OBJECTIVE IMPAIRMENTS: Abnormal gait, decreased activity tolerance, decreased balance, decreased endurance, decreased knowledge of condition, decreased knowledge of use of DME, decreased strength, impaired vision/preception, and prosthetic dependency .   ACTIVITY LIMITATIONS: carrying, lifting, standing, squatting, transfers, hygiene/grooming, locomotion level, and caring for others  PARTICIPATION LIMITATIONS: meal prep, cleaning, interpersonal relationship, driving, shopping, community activity, and occupation  PERSONAL FACTORS: Age, Behavior pattern, Past/current experiences, Social background, Time  since onset of injury/illness/exacerbation, Transportation, and 3+ comorbidities: see above are also affecting patient's functional outcome.   REHAB POTENTIAL: Good  CLINICAL DECISION MAKING: Stable/uncomplicated  EVALUATION COMPLEXITY: Low  PLAN:  PT FREQUENCY: 2x/week  PT DURATION: 12 weeks  PLANNED INTERVENTIONS: 97164- PT Re-evaluation, 97110-Therapeutic exercises, 97530- Therapeutic activity, 97112- Neuromuscular re-education, 97535- Self Care, [id number]- Manual therapy, [phone]- Gait training, [phone]- Prosthetic training, [phone]- Aquatic Therapy, Patient/Family education, Balance training, Stair training, Scar mobilization, Vestibular training, Visual/preceptual remediation/compensation, DME instructions, and Wheelchair mobility training  PLAN FOR NEXT SESSION: Gait, balance, floor transfers  how is modified sink HEP?, balance recovery in standing/static stability w/ dec UE reliance, walking, dynamic/standing balance, balance recovery, tricep strength, hip extension/abd strength/pelvic mobility  [facility] - Dr. Kelley nurse 6/13 phone call:  pt has no precautions post-op and is able to wear an abdominal binder for lateral abdominal support as needed  May need to call [facility] and determine plan for elevating prosthetics to determine ongoing PT POC.- going 7/14 and will  update POC then (pt aware)  How was [facility] appt? If CNA Elta) is present: review sink HEP + household gait to make sure she's comfortable assisting Neeley   Harkirat Orozco A Nilah Belcourt, PT [name], PT, DPT, CBIS  08/28/2023, 2:35 PM

## 2023-09-01 ENCOUNTER — Ambulatory Visit: Admitting: Physical Therapy

## 2023-09-01 ENCOUNTER — Encounter: Payer: Self-pay | Admitting: Physical Therapy

## 2023-09-01 DIAGNOSIS — R29898 Other symptoms and signs involving the musculoskeletal system: Secondary | ICD-10-CM

## 2023-09-01 DIAGNOSIS — M6281 Muscle weakness (generalized): Secondary | ICD-10-CM | POA: Diagnosis not present

## 2023-09-01 DIAGNOSIS — R2689 Other abnormalities of gait and mobility: Secondary | ICD-10-CM

## 2023-09-01 DIAGNOSIS — R293 Abnormal posture: Secondary | ICD-10-CM

## 2023-09-01 NOTE — Patient Instructions (Signed)
 When standing from wheelchair make sure prosthetics tucked under her but not touching for sturdy base of support.  In standing, tighten lanyard straps to ensure best prosthetic fit.  You may use a gait belt or equivalent alternative (pt prefers this up high under armpits for comfort) to hold onto though pt can walk with just supervision (stay close in event of loss of balance!).    When walking ensure she feels her prosthetics are appropriately under her with each step and assist w/ correction as needed.  Encourage Sariah to provide feedback during walking so you know how she is feeling and what she may need help with.  Everyday may look a bit different depending on fatigue, blood glucose, etc.  Some days she may need you to follow her closely with the wheelchair behind her for short distances.  Some days she may be fine without it for short household distances.    If there is an unrecoverable loss of balance during walking, please hold tightly to patient or gait belt and try to control her lower to the ground.    Please let our clinic know if you have any questions!  Thank you for your assistance!  Daved Bull, PT, DPT

## 2023-09-01 NOTE — Therapy (Signed)
 OUTPATIENT PHYSICAL THERAPY NEURO TREATMENT   Patient Name: Kelli Hudson MRN: [id number] DOB:[birth year], 33 y.o., female Today's Date: 09/01/2023   PCP: Daphne Lesches, NP REFERRING PROVIDER: Jerona Sage, MD  END OF SESSION:  PT End of Session - 09/01/23 1542     Visit Number 34    Number of Visits 49    Date for PT Re-Evaluation 10/02/23    Authorization Type Medicare/Medicaid    Progress Note Due on Visit 40    PT Start Time 1536    PT Stop Time 1616    PT Time Calculation (min) 40 min    Equipment Utilized During Treatment Gait belt    Activity Tolerance Patient tolerated treatment well    Behavior During Therapy WFL for tasks assessed/performed          Past Medical History:  Diagnosis Date   Acute H. pylori gastric ulcer    Coffee ground emesis    Diabetes mellitus (HCC)    Diabetic gastroparesis (HCC)    DKA (diabetic ketoacidosis) (HCC) 02/24/2021   Gastroparesis    GERD (gastroesophageal reflux disease)    Hypertension    Hyperthyroidism    Intractable nausea and vomiting 04/20/2021   Normocytic anemia 04/20/2020   Prolonged Q-T interval on ECG    Past Surgical History:  Procedure Laterality Date   AMPUTATION Bilateral 10/24/2022   Procedure: BILATERAL BELOW KNEE AMPUTATION;  Surgeon: Sage Jerona GAILS, MD;  Location: [facility] OR;  Service: Orthopedics;  Laterality: Bilateral;   AMPUTATION Bilateral 10/08/2022   Procedure: BILATERAL LEG DEBRIDEMENT;  Surgeon: Sage Jerona GAILS, MD;  Location: [facility] OR;  Service: Orthopedics;  Laterality: Bilateral;   AMPUTATION Bilateral 11/14/2022   Procedure: BILATERAL ABOVE KNEE AMPUTATION;  Surgeon: Sage Jerona GAILS, MD;  Location: [facility] OR;  Service: Orthopedics;  Laterality: Bilateral;   AMPUTATION TOE Left 03/10/2018   Procedure: AMPUTATION FIFTH TOE;  Surgeon: Gershon Donnice SAUNDERS, DPM;  Location: [facility];  Service: Podiatry;  Laterality: Left;   APPLICATION OF WOUND VAC Bilateral 10/12/2022   Procedure:  APPLICATION OF WOUND VAC BILATERAL LEGS;  Surgeon: Celena Sharper, MD;  Location: [facility] OR;  Service: Orthopedics;  Laterality: Bilateral;   APPLICATION OF WOUND VAC Bilateral 10/24/2022   Procedure: APPLICATION OF WOUND VAC;  Surgeon: Sage Jerona GAILS, MD;  Location: [facility] OR;  Service: Orthopedics;  Laterality: Bilateral;   BIOPSY  01/28/2020   Procedure: BIOPSY;  Surgeon: Elicia Claw, MD;  Location: [facility];  Service: Gastroenterology;;   BOTOX  INJECTION  08/26/2021   Procedure: BOTOX  INJECTION;  Surgeon: Legrand Victory LITTIE DOUGLAS, MD;  Location: [facility];  Service: Gastroenterology;;   ESOPHAGOGASTRODUODENOSCOPY N/A 01/28/2020   Procedure: ESOPHAGOGASTRODUODENOSCOPY (EGD);  Surgeon: Elicia Claw, MD;  Location: [facility];  Service: Gastroenterology;  Laterality: N/A;   ESOPHAGOGASTRODUODENOSCOPY (EGD) WITH PROPOFOL  Left 09/08/2015   Procedure: ESOPHAGOGASTRODUODENOSCOPY (EGD) WITH PROPOFOL ;  Surgeon: Elsie Cree, MD;  Location: [facility] END[facility];  Service: Endoscopy;  Laterality: Left;   ESOPHAGOGASTRODUODENOSCOPY (EGD) WITH PROPOFOL  N/A 08/26/2021   Procedure: ESOPHAGOGASTRODUODENOSCOPY (EGD) WITH PROPOFOL ;  Surgeon: Legrand Victory LITTIE DOUGLAS, MD;  Location: [facility];  Service: Gastroenterology;  Laterality: N/A;   GASTRIC STIMULATOR IMPLANT SURGERY  06/2022   at [facility]   I & D EXTREMITY Bilateral 10/12/2022   Procedure: IRRIGATION AND  DEBRIDEMENT  BILATERAL LEGS;  Surgeon: Celena Sharper, MD;  Location: [facility] OR;  Service: Orthopedics;  Laterality: Bilateral;   I & D EXTREMITY Right 10/13/2022   Procedure: RIGHT THIGH WOUND VAC EXCHANGE;  Surgeon: Kendal Franky SQUIBB, MD;  Location: [facility] OR;  Service: Orthopedics;  Laterality: Right;   I & D EXTREMITY Bilateral 10/17/2022   Procedure: BILATERAL THIGH AND LEG DEBRIDEMENT, PARTIAL BILATERAL FOOT AMPUTATION;  Surgeon: Harden Jerona GAILS, MD;  Location: [facility] OR;  Service: Orthopedics;  Laterality: Bilateral;   I & D EXTREMITY Bilateral 11/05/2022   Procedure:  BILATERAL THIGH DEBRIDEMENT;  Surgeon: Harden Jerona GAILS, MD;  Location: [facility] OR;  Service: Orthopedics;  Laterality: Bilateral;   I & D EXTREMITY Bilateral 11/07/2022   Procedure: BILATERAL THIGH AND LEG DEBRIDEMENT;  Surgeon: Harden Jerona GAILS, MD;  Location: [facility] OR;  Service: Orthopedics;  Laterality: Bilateral;   I & D EXTREMITY Bilateral 11/12/2022   Procedure: BILATERAL LEG DEBRIDEMENTS;  Surgeon: Harden Jerona GAILS, MD;  Location: [facility] OR;  Service: Orthopedics;  Laterality: Bilateral;   IRRIGATION AND DEBRIDEMENT FOOT Bilateral 10/05/2022   Procedure: IRRIGATION AND DEBRIDEMENT FOOT;  Surgeon: Janit Thresa HERO, DPM;  Location: [facility];  Service: Orthopedics/Podiatry;  Laterality: Bilateral;   WISDOM TOOTH EXTRACTION     Patient Active Problem List   Diagnosis Date Noted   Influenza A 04/05/2023   Elevated lipase 04/05/2023   Esophagitis 03/24/2023   SIRS (systemic inflammatory response syndrome) (HCC) 03/02/2023   Fever 03/02/2023   Metabolic acidosis 03/02/2023   Hypertensive urgency 02/21/2023   Chronic pain 02/21/2023   Abscess of thigh 11/28/2022   C. 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   Hypokalemia 11/27/2022   Effusion, left knee 11/05/2022   MDD (major depressive disorder), recurrent episode, moderate (HCC) 10/16/2022   Necrotizing fasciitis of pelvic region and thigh (HCC) 10/08/2022   Necrotizing fasciitis of lower leg (HCC) 10/08/2022   Necrotizing fasciitis (HCC) 10/08/2022   Sepsis due to cellulitis (HCC) 10/03/2022   Cellulitis of lower extremity 10/03/2022   Multinodular goiter 06/18/2022   Malnutrition of moderate degree 04/18/2022   Intractable vomiting with nausea 04/17/2022   DKA (diabetic ketoacidosis) (HCC) 03/04/2022   Nausea vomiting and diarrhea 12/25/2021   Diabetic gastroparesis (HCC) 10/09/2021   Drug-seeking behavior 09/21/2021   Essential hypertension 08/25/2021   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) 08/25/2021   GERD without esophagitis 08/25/2021   Hematemesis    Prolonged Q-T interval on ECG    Nausea and vomiting 07/20/2021   Anemia of chronic disease 06/06/2021   Dehydration 06/04/2021   Hypomagnesemia 04/21/2021   Ulcerated, foot, left, with fat layer exposed (HCC) 04/20/2021   Uncontrolled type 1 diabetes mellitus with hyperglycemia, with long-term current use of insulin  (HCC) 12/18/2020   DM type 1 (diabetes mellitus, type 1) (HCC) 12/18/2020   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) 11/09/2020   Diabetic retinopathy of both eyes associated with type 1 diabetes mellitus (HCC) 09/24/2020   Diabetic gastroparesis associated with type 1 diabetes mellitus (HCC) 08/12/2020   Acute kidney injury superimposed on chronic kidney disease (HCC) 07/25/2020   Generalized abdominal pain 07/23/2020   GERD (gastroesophageal reflux disease) 07/23/2020   Diabetic nephropathy associated with type 1 diabetes mellitus (HCC) 06/27/2020   Sepsis (HCC) 05/27/2020   HLD (hyperlipidemia) 05/23/2020   Sinus tachycardia 05/09/2020   Anemia 04/20/2020   Depression with anxiety 07/05/2019   Diabetic polyneuropathy associated with type 1 diabetes mellitus (HCC) 09/24/2017   Gastroparesis    Goiter 10/05/2009   DM2 (diabetes mellitus, type 2) (HCC) 10/05/2009   Hypertension associated with diabetes (HCC) 10/05/2009  ONSET DATE: 04/07/23 referral  REFERRING DIAG: [phone] (ICD-10-CM) - S/P AKA (above knee amputation) bilateral (HCC)   THERAPY DIAG:  Muscle weakness (generalized)  Other abnormalities of gait and mobility  Abnormal posture  Other symptoms and signs involving the musculoskeletal system  Rationale for Evaluation and Treatment: Rehabilitation  SUBJECTIVE:                                                                                                                                                                                              SUBJECTIVE STATEMENT: Patient reports doing well. States her CNA cannot come to appts as she cannot clock in/out because pt does not have CAP.  She saw [facility] and her prosthetist Chyrl put in a request for microprocessor knees, maintaining lanyard suspension, she does not think she is getting an ankle joint (PT informed her they have to add an ankle joint to add a knee joint).  Denies falls.   Pt accompanied by: friend - Drew  PERTINENT HISTORY: DM, diabetic gastroparesis, GERD, HTN, hyperthyroidism  PAIN:  Are you having pain? No  Blood sugar at onset of session (sensor on LRQ):  197   PRECAUTIONS: Fall; B diabetic retinopathy  PATIENT GOALS: to be able to walk again                                                                                                                              TREATMENT -Time spent allowing pt to put on prosthetics, minA for right lanyard strap due to persistent twisting on inserting. -Discussed increasing wear schedule gradually (add 1-2 hours per day this week and continue next week until reaching all awake hours) -Pt ambulates 37 ft discontinuously prior to needing seated recovery, she utilizes 3 standing unsupported breaks to assess her balance before continuing.  +2 w/c follow from [place] due to fatigue noted today. -See pt instructions for PT note to CNA regarding gait safety and practicing at home.  PATIENT EDUCATION: Education details: continue HEP, continue trying to stand at home, PT POC, did inform pt that goal is  to wear the prosthetic all awake hours without skin breakdown vs current only 1 hour a day.  See pt instructions for further note to CNA to help with gait training at home. Person educated: Patient and friend Education method: Medical illustrator Education comprehension: verbalized understanding and needs further education  HOME EXERCISE PROGRAM: -practice donning/doffing prosthetic Access Code: [email or web address] URL:  [email or web address] Date: 04/20/2023 Prepared by: Delon Pop  Exercises - Prone Hip Extension with Residual Limb (BKA)  - 1 x daily - 7 x weekly - 3 sets - 10 reps - Sidelying Hip Abduction (AKA)  - 1 x daily - 7 x weekly - 3 sets - 10 reps - Supine Piriformis Stretch with Residual Limb (AKA)  - 1 x daily - 7 x weekly - 3 sets - 10 reps  Do each exercise 1-2  times per day Do each exercise 10 repetitions Hold each exercise for 2 seconds to feel your location  AT SINK FIND YOUR MIDLINE POSITION AND PLACE FEET EQUAL DISTANCE FROM THE MIDLINE.  Try to find this position when standing still for activities.   USE TAPE ON FLOOR TO MARK THE MIDLINE POSITION. You also should try to feel with your limb pressure in socket.  You are trying to feel with limb what you used to feel with the bottom of your foot.  Side to Side Shift: Moving your hips only (not shoulders): move weight onto your left leg, HOLD/FEEL.  Move back to equal weight on each leg, HOLD/FEEL. Move weight onto your right leg, HOLD/FEEL. Move back to equal weight on each leg, HOLD/FEEL. Repeat.  Start with both hands on sink, progress to right hand only, then no hands.  Front to Back Shift: Moving your hips only (not shoulders): move your weight forward onto your toes, HOLD/FEEL. Move your weight back to equal Flat Foot on both legs, HOLD/FEEL. Move your weight back onto your heels, HOLD/FEEL. Move your weight back to equal on both legs, HOLD/FEEL. Repeat.   tart with both hands on sink, progress to right hand only, then no hands. Moving Cones / Cups: With equal weight on each leg: Hold on with one hand the first time, then progress to no hand supports. Move cups from one side of sink to the other. Place cups ~2" out of your reach, progress to 10" beyond reach.  Place one hand in middle of sink and reach with other hand. Do both arms.  Then hover one hand and move cups with other hand. -just use a crossbody reach to end of  safe weight shift ROM Overhead/Upward Reaching: alternated reaching up to top cabinets or ceiling if no cabinets present. Keep equal weight on each leg. Start with one hand support on counter while other hand reaches and progress to no hand support with reaching.  ace one hand in middle of sink and reach with other hand. Do both arms.  Then hover one hand and move cups with other hand.  5.   Looking Over Shoulders: With equal weight on each leg: alternate turning to look over your shoulders with one hand support on counter as needed.  Start with head motions only to look in front of shoulder, then even with shoulder and progress to looking behind you. To look to side, move head /eyes, then shoulder on side looking pulls back, shift more weight to side looking and pull hip back. ace one hand in middle of sink and let go with other hand so your shoulder can pull  back. Switch hands to look other way.   Then hover one hand and move cups with other hand.  6.  Stepping with leg that is not amputated:  Move items under cabinet out of your way. Shift your hips/pelvis so weight on prosthesis. SLOWLY step other leg so front of foot is in cabinet. Then step back to floor. - just hike the hip and place the foot back in the starting spot  GOALS: Goals reviewed with patient? Yes  SHORT TERM GOALS: Target date: 05/15/23  Pt will be independent with initial HEP for improved gait mechanics and balance  Baseline: to be updated, reports she has not been doing at home doesn't trust CNA Goal status: NOT MET  2.  Patient will ambulate at least 9ft with LRAD and no more than MinA to demonstrate improved mobility  Baseline: 47ft // bars + MinA, 10 feet with 2WW + minA Goal status: NOT met  3.  Patient will complete sit <> stand with LRAD and no more than CGA for improved independence with transfers Baseline: MinA, required minA in today's session  Goal status: NOT met - required minA due to posterior instability  4.   Patient will complete floor transfer with assist as initial step of fall recovery  Baseline: unable to complete on eval; MinA Goal status: MET   LONG TERM GOALS: Target date: 07/10/23  Pt will be independent with final HEP for improved gait mechanics and balance  Baseline: to be updated; provided Goal status: MET  Patient will ambulate at least 42ft with LRAD, ModI to demonstrate improved mobility  Baseline: 67ft // bars + MinA; 115' with supervision  Goal status: NOT MET  Patient will complete sit <> stand with LRAD. SBA for improved independence with transfers Baseline: MinA; CGA Goal status: NOT MET  Patient will complete floor transfer without assist as initial step of fall recovery  Baseline: unable to complete on eval; MinA Goal status: NOT MET  5. Patient will don/doff prosthetics independently   Baseline: MaxA, independent   Goal status: MET   NEW SHORT TERM GOALS: 08/07/23 1.Pt will be independent with new initial HEP for improved gait mechanics and balance  Baseline: to be updated; provided; has not gone back to doing sink HEP, CNA has been off for holiday (6/20) Goal status: NOT MET  2. Patient will ambulate at least 121ft with LRAD, supervision to demonstrate improved mobility  Baseline: 60ft // bars + MinA; 115' with supervision; 56' 2WW CGA (6/20) Goal status:  NOT MET  3.Patient will complete sit <> stand with LRAD ModI for improved independence with transfers Baseline: CGA; min-modA to 2WW (6/20) Goal status: NOT MET  4. Patient will complete floor transfer without assist as initial step of fall recovery  Baseline:  MinA; MinA from 6 step Goal status: NOT MET  NEW LONG TERM GOALS: 09/28/23  1. Pt will be independent with final HEP for improved functional strength and mobility  Baseline: to be updated Goal status: NEW  2. Patient will ambulate 264ft using LRAD and supervision for improved limited community access Baseline: 115' with supervision/CGA +  RW Goal status: NEW  3. Patient will complete standing dynamic task ModI for at least 5 mins for greater independence with ADLs Baseline: ~70mins with CGA Goal status: NEW     ASSESSMENT:  CLINICAL IMPRESSION: Patient seen for skilled PT session with emphasis on progressing gait retraining.  Time also spent providing further instructions for CNA regarding gait training and safety at  home based on most common scenarios encountered in sessions.  Pt has a harder time ambulating continuously this session vs last, but this is not uncommon for her to fluctuate due to blood sugar, prior activity, fatigue, etc.  She has a prosthetic plan that [facility] has submitted a request to obtain bilateral MPK, but unsure of ankle component at current.  She will continue to work on wear schedule and tolerance to all awake hours to support benefit of higher level prosthetic.  She continues to benefit from skilled PT services to support her during this prosthetic transition to ensure highest level of functionality.  Continue POC.   OBJECTIVE IMPAIRMENTS: Abnormal gait, decreased activity tolerance, decreased balance, decreased endurance, decreased knowledge of condition, decreased knowledge of use of DME, decreased strength, impaired vision/preception, and prosthetic dependency .   ACTIVITY LIMITATIONS: carrying, lifting, standing, squatting, transfers, hygiene/grooming, locomotion level, and caring for others  PARTICIPATION LIMITATIONS: meal prep, cleaning, interpersonal relationship, driving, shopping, community activity, and occupation  PERSONAL FACTORS: Age, Behavior pattern, Past/current experiences, Social background, Time since onset of injury/illness/exacerbation, Transportation, and 3+ comorbidities: see above are also affecting patient's functional outcome.   REHAB POTENTIAL: Good  CLINICAL DECISION MAKING: Stable/uncomplicated  EVALUATION COMPLEXITY: Low  PLAN:  PT FREQUENCY: 2x/week  PT DURATION:  12 weeks  PLANNED INTERVENTIONS: 97164- PT Re-evaluation, 97110-Therapeutic exercises, 97530- Therapeutic activity, 97112- Neuromuscular re-education, 97535- Self Care, [id number]- Manual therapy, [phone]- Gait training, [phone]- Prosthetic training, [phone]- Aquatic Therapy, Patient/Family education, Balance training, Stair training, Scar mobilization, Vestibular training, Visual/preceptual remediation/compensation, DME instructions, and Wheelchair mobility training  PLAN FOR NEXT SESSION: Gait, balance, floor transfers  how is modified sink HEP?, balance recovery in standing/static stability w/ dec UE reliance, walking, dynamic/standing balance, balance recovery, tricep strength, hip extension/abd strength/pelvic mobility; do we have a better timeline for [facility] adjustments?  [facility] - Dr. Kelley nurse 6/13 phone call:  pt has no precautions post-op and is able to wear an abdominal binder for lateral abdominal support as needed  CNA cannot come with pt because she does not have CAP   Daved KATHEE Bull, PT, DPT  09/01/2023, 4:18 PM

## 2023-09-04 ENCOUNTER — Ambulatory Visit: Admitting: Physical Therapy

## 2023-09-04 ENCOUNTER — Encounter: Payer: Self-pay | Admitting: Physical Therapy

## 2023-09-04 VITALS — BP 137/96 | HR 96

## 2023-09-04 DIAGNOSIS — R29898 Other symptoms and signs involving the musculoskeletal system: Secondary | ICD-10-CM

## 2023-09-04 DIAGNOSIS — M6281 Muscle weakness (generalized): Secondary | ICD-10-CM

## 2023-09-04 DIAGNOSIS — R293 Abnormal posture: Secondary | ICD-10-CM

## 2023-09-04 DIAGNOSIS — R2689 Other abnormalities of gait and mobility: Secondary | ICD-10-CM

## 2023-09-04 NOTE — Therapy (Signed)
 OUTPATIENT PHYSICAL THERAPY NEURO TREATMENT   Patient Name: Kelli Hudson MRN: [id number] DOB:Jan 06, 1991, 33 y.o., female Today's Date: 09/04/2023   PCP: Daphne Lesches, NP REFERRING PROVIDER: Jerona Sage, MD  END OF SESSION:  PT End of Session - 09/04/23 1347     Visit Number 35    Number of Visits 49    Date for PT Re-Evaluation 10/02/23    Authorization Type Medicare/Medicaid    Progress Note Due on Visit 40    PT Start Time 1315    PT Stop Time 1344    PT Time Calculation (min) 29 min    Equipment Utilized During Treatment Gait belt    Activity Tolerance Patient tolerated treatment well    Behavior During Therapy WFL for tasks assessed/performed          Past Medical History:  Diagnosis Date   Acute H. pylori gastric ulcer    Coffee ground emesis    Diabetes mellitus (HCC)    Diabetic gastroparesis (HCC)    DKA (diabetic ketoacidosis) (HCC) 02/24/2021   Gastroparesis    GERD (gastroesophageal reflux disease)    Hypertension    Hyperthyroidism    Intractable nausea and vomiting 04/20/2021   Normocytic anemia 04/20/2020   Prolonged Q-T interval on ECG    Past Surgical History:  Procedure Laterality Date   AMPUTATION Bilateral 10/24/2022   Procedure: BILATERAL BELOW KNEE AMPUTATION;  Surgeon: Sage Jerona GAILS, MD;  Location: [facility] OR;  Service: Orthopedics;  Laterality: Bilateral;   AMPUTATION Bilateral 10/08/2022   Procedure: BILATERAL LEG DEBRIDEMENT;  Surgeon: Sage Jerona GAILS, MD;  Location: [facility] OR;  Service: Orthopedics;  Laterality: Bilateral;   AMPUTATION Bilateral 11/14/2022   Procedure: BILATERAL ABOVE KNEE AMPUTATION;  Surgeon: Sage Jerona GAILS, MD;  Location: [facility] OR;  Service: Orthopedics;  Laterality: Bilateral;   AMPUTATION TOE Left 03/10/2018   Procedure: AMPUTATION FIFTH TOE;  Surgeon: Gershon Donnice SAUNDERS, DPM;  Location: [facility];  Service: Podiatry;  Laterality: Left;   APPLICATION OF WOUND VAC Bilateral 10/12/2022   Procedure:  APPLICATION OF WOUND VAC BILATERAL LEGS;  Surgeon: Celena Sharper, MD;  Location: [facility] OR;  Service: Orthopedics;  Laterality: Bilateral;   APPLICATION OF WOUND VAC Bilateral 10/24/2022   Procedure: APPLICATION OF WOUND VAC;  Surgeon: Sage Jerona GAILS, MD;  Location: [facility] OR;  Service: Orthopedics;  Laterality: Bilateral;   BIOPSY  01/28/2020   Procedure: BIOPSY;  Surgeon: Elicia Claw, MD;  Location: [facility];  Service: Gastroenterology;;   BOTOX  INJECTION  08/26/2021   Procedure: BOTOX  INJECTION;  Surgeon: Legrand Victory LITTIE DOUGLAS, MD;  Location: [facility];  Service: Gastroenterology;;   ESOPHAGOGASTRODUODENOSCOPY N/A 01/28/2020   Procedure: ESOPHAGOGASTRODUODENOSCOPY (EGD);  Surgeon: Elicia Claw, MD;  Location: [facility];  Service: Gastroenterology;  Laterality: N/A;   ESOPHAGOGASTRODUODENOSCOPY (EGD) WITH PROPOFOL  Left 09/08/2015   Procedure: ESOPHAGOGASTRODUODENOSCOPY (EGD) WITH PROPOFOL ;  Surgeon: Elsie Cree, MD;  Location: [facility] ENDOSCOPY;  Service: Endoscopy;  Laterality: Left;   ESOPHAGOGASTRODUODENOSCOPY (EGD) WITH PROPOFOL  N/A 08/26/2021   Procedure: ESOPHAGOGASTRODUODENOSCOPY (EGD) WITH PROPOFOL ;  Surgeon: Legrand Victory LITTIE DOUGLAS, MD;  Location: [facility];  Service: Gastroenterology;  Laterality: N/A;   GASTRIC STIMULATOR IMPLANT SURGERY  06/2022   at [facility]   I & D EXTREMITY Bilateral 10/12/2022   Procedure: IRRIGATION AND  DEBRIDEMENT  BILATERAL LEGS;  Surgeon: Celena Sharper, MD;  Location: [facility] OR;  Service: Orthopedics;  Laterality: Bilateral;   I & D EXTREMITY Right 10/13/2022   Procedure: RIGHT THIGH WOUND VAC EXCHANGE;  Surgeon: Kendal Franky SQUIBB, MD;  Location: [facility] OR;  Service: Orthopedics;  Laterality: Right;   I & D EXTREMITY Bilateral 10/17/2022   Procedure: BILATERAL THIGH AND LEG DEBRIDEMENT, PARTIAL BILATERAL FOOT AMPUTATION;  Surgeon: Harden Jerona GAILS, MD;  Location: [facility] OR;  Service: Orthopedics;  Laterality: Bilateral;   I & D EXTREMITY Bilateral 11/05/2022   Procedure:  BILATERAL THIGH DEBRIDEMENT;  Surgeon: Harden Jerona GAILS, MD;  Location: [facility] OR;  Service: Orthopedics;  Laterality: Bilateral;   I & D EXTREMITY Bilateral 11/07/2022   Procedure: BILATERAL THIGH AND LEG DEBRIDEMENT;  Surgeon: Harden Jerona GAILS, MD;  Location: [facility] OR;  Service: Orthopedics;  Laterality: Bilateral;   I & D EXTREMITY Bilateral 11/12/2022   Procedure: BILATERAL LEG DEBRIDEMENTS;  Surgeon: Harden Jerona GAILS, MD;  Location: [facility] OR;  Service: Orthopedics;  Laterality: Bilateral;   IRRIGATION AND DEBRIDEMENT FOOT Bilateral 10/05/2022   Procedure: IRRIGATION AND DEBRIDEMENT FOOT;  Surgeon: Janit Thresa HERO, DPM;  Location: [facility];  Service: Orthopedics/Podiatry;  Laterality: Bilateral;   WISDOM TOOTH EXTRACTION     Patient Active Problem List   Diagnosis Date Noted   Influenza A 04/05/2023   Elevated lipase 04/05/2023   Esophagitis 03/24/2023   SIRS (systemic inflammatory response syndrome) (HCC) 03/02/2023   Fever 03/02/2023   Metabolic acidosis 03/02/2023   Hypertensive urgency 02/21/2023   Chronic pain 02/21/2023   Abscess of thigh 11/28/2022   C. 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   Hypokalemia 11/27/2022   Effusion, left knee 11/05/2022   MDD (major depressive disorder), recurrent episode, moderate (HCC) 10/16/2022   Necrotizing fasciitis of pelvic region and thigh (HCC) 10/08/2022   Necrotizing fasciitis of lower leg (HCC) 10/08/2022   Necrotizing fasciitis (HCC) 10/08/2022   Sepsis due to cellulitis (HCC) 10/03/2022   Cellulitis of lower extremity 10/03/2022   Multinodular goiter 06/18/2022   Malnutrition of moderate degree 04/18/2022   Intractable vomiting with nausea 04/17/2022   DKA (diabetic ketoacidosis) (HCC) 03/04/2022   Nausea vomiting and diarrhea 12/25/2021   Diabetic gastroparesis (HCC) 10/09/2021   Drug-seeking behavior 09/21/2021   Essential hypertension 08/25/2021   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) 08/25/2021   GERD without esophagitis 08/25/2021   Hematemesis    Prolonged Q-T interval on ECG    Nausea and vomiting 07/20/2021   Anemia of chronic disease 06/06/2021   Dehydration 06/04/2021   Hypomagnesemia 04/21/2021   Ulcerated, foot, left, with fat layer exposed (HCC) 04/20/2021   Uncontrolled type 1 diabetes mellitus with hyperglycemia, with long-term current use of insulin  (HCC) 12/18/2020   DM type 1 (diabetes mellitus, type 1) (HCC) 12/18/2020   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) 11/09/2020   Diabetic retinopathy of both eyes associated with type 1 diabetes mellitus (HCC) 09/24/2020   Diabetic gastroparesis associated with type 1 diabetes mellitus (HCC) 08/12/2020   Acute kidney injury superimposed on chronic kidney disease (HCC) 07/25/2020   Generalized abdominal pain 07/23/2020   GERD (gastroesophageal reflux disease) 07/23/2020   Diabetic nephropathy associated with type 1 diabetes mellitus (HCC) 06/27/2020   Sepsis (HCC) 05/27/2020   HLD (hyperlipidemia) 05/23/2020   Sinus tachycardia 05/09/2020   Anemia 04/20/2020   Depression with anxiety 07/05/2019   Diabetic polyneuropathy associated with type 1 diabetes mellitus (HCC) 09/24/2017   Gastroparesis    Goiter 10/05/2009   DM2 (diabetes mellitus, type 2) (HCC) 10/05/2009   Hypertension associated with diabetes (HCC) 10/05/2009  ONSET DATE: 04/07/23 referral  REFERRING DIAG: [phone] (ICD-10-CM) - S/P AKA (above knee amputation) bilateral (HCC)   THERAPY DIAG:  Muscle weakness (generalized)  Other abnormalities of gait and mobility  Abnormal posture  Other symptoms and signs involving the musculoskeletal system  Rationale for Evaluation and Treatment: Rehabilitation  SUBJECTIVE:                                                                                                                                                                                              SUBJECTIVE STATEMENT: Patient reports vomiting last night and earlier today and she is unsure what caused this.  She thinks the sausage she ate last night was too late to eat and did not sit well with her.  Denies falls.   Pt accompanied by: friend - Drew  PERTINENT HISTORY: DM, diabetic gastroparesis, GERD, HTN, hyperthyroidism  PAIN:  Are you having pain? No  Blood sugar at onset of session (sensor on LUE):  351  RUE BP: Vitals:   09/04/23 1324  BP: (!) 137/96  Pulse: 96    PRECAUTIONS: Fall; B diabetic retinopathy  PATIENT GOALS: to be able to walk again                                                                                                                              TREATMENT Assessed BP and had pt check glucose as above - diastolic elevated and glucose out of safe range for therapy.  Session limited due to this and prior vomiting.  She denies nausea at current, but reports not feeling her best but having a positive attitude.  Discussed safe limits for PT.  Encouraged ongoing monitoring at home.  Drew logs BP for her. Discussed ongoing POC w/ pt and Drew: She has 1st of 3 [facility] fitting appts August 31.  She will not know auth for prosthetic changes for roughly 14 days from most recent [facility] appt. Discussed discharging at last scheduled visit and doing new evaluation for new prosthetics once she has them.  She is near functional max  w/ current shortened prosthetics.  Process for obtaining new referral. Goal of increasing wear tolerance to all awake hours and continuing to stand at home - try to ambulate w/ CNA as she feels confident to do so. Will work on controlled falls and recovery at last treatment session.  PATIENT EDUCATION: Education details: continue HEP, continue trying to stand at home, PT POC, did inform pt that goal is to wear the prosthetic all awake hours without skin breakdown vs current only 1 hour a day.  See above for further. Person educated:  Patient and friend Education method: Medical illustrator Education comprehension: verbalized understanding and needs further education  HOME EXERCISE PROGRAM: -practice donning/doffing prosthetic Access Code: [email or web address] URL: [email or web address] Date: 04/20/2023 Prepared by: Delon Pop  Exercises - Prone Hip Extension with Residual Limb (BKA)  - 1 x daily - 7 x weekly - 3 sets - 10 reps - Sidelying Hip Abduction (AKA)  - 1 x daily - 7 x weekly - 3 sets - 10 reps - Supine Piriformis Stretch with Residual Limb (AKA)  - 1 x daily - 7 x weekly - 3 sets - 10 reps  Do each exercise 1-2  times per day Do each exercise 10 repetitions Hold each exercise for 2 seconds to feel your location  AT SINK FIND YOUR MIDLINE POSITION AND PLACE FEET EQUAL DISTANCE FROM THE MIDLINE.  Try to find this position when standing still for activities.   USE TAPE ON FLOOR TO MARK THE MIDLINE POSITION. You also should try to feel with your limb pressure in socket.  You are trying to feel with limb what you used to feel with the bottom of your foot.  Side to Side Shift: Moving your hips only (not shoulders): move weight onto your left leg, HOLD/FEEL.  Move back to equal weight on each leg, HOLD/FEEL. Move weight onto your right leg, HOLD/FEEL. Move back to equal weight on each leg, HOLD/FEEL. Repeat.  Start with both hands on sink, progress to right hand only, then no hands.  Front to Back Shift: Moving your hips only (not shoulders): move your weight forward onto your toes, HOLD/FEEL. Move your weight back to equal Flat Foot on both legs, HOLD/FEEL. Move your weight back onto your heels, HOLD/FEEL. Move your weight back to equal on both legs, HOLD/FEEL. Repeat.   tart with both hands on sink, progress to right hand only, then no hands. Moving Cones / Cups: With equal weight on each leg: Hold on with one hand the first time, then progress to no hand supports. Move cups from one side of sink to  the other. Place cups ~2" out of your reach, progress to 10" beyond reach.  Place one hand in middle of sink and reach with other hand. Do both arms.  Then hover one hand and move cups with other hand. -just use a crossbody reach to end of safe weight shift ROM Overhead/Upward Reaching: alternated reaching up to top cabinets or ceiling if no cabinets present. Keep equal weight on each leg. Start with one hand support on counter while other hand reaches and progress to no hand support with reaching.  ace one hand in middle of sink and reach with other hand. Do both arms.  Then hover one hand and move cups with other hand.  5.   Looking Over Shoulders: With equal weight on each leg: alternate turning to look over your shoulders with one hand support on counter as needed.  Start with head motions  only to look in front of shoulder, then even with shoulder and progress to looking behind you. To look to side, move head /eyes, then shoulder on side looking pulls back, shift more weight to side looking and pull hip back. ace one hand in middle of sink and let go with other hand so your shoulder can pull back. Switch hands to look other way.   Then hover one hand and move cups with other hand.  6.  Stepping with leg that is not amputated:  Move items under cabinet out of your way. Shift your hips/pelvis so weight on prosthesis. SLOWLY step other leg so front of foot is in cabinet. Then step back to floor. - just hike the hip and place the foot back in the starting spot  GOALS: Goals reviewed with patient? Yes  SHORT TERM GOALS: Target date: 05/15/23  Pt will be independent with initial HEP for improved gait mechanics and balance  Baseline: to be updated, reports she has not been doing at home doesn't trust CNA Goal status: NOT MET  2.  Patient will ambulate at least 30ft with LRAD and no more than MinA to demonstrate improved mobility  Baseline: 45ft // bars + MinA, 10 feet with 2WW + minA Goal status: NOT  met  3.  Patient will complete sit <> stand with LRAD and no more than CGA for improved independence with transfers Baseline: MinA, required minA in today's session  Goal status: NOT met - required minA due to posterior instability  4.  Patient will complete floor transfer with assist as initial step of fall recovery  Baseline: unable to complete on eval; MinA Goal status: MET   LONG TERM GOALS: Target date: 07/10/23  Pt will be independent with final HEP for improved gait mechanics and balance  Baseline: to be updated; provided Goal status: MET  Patient will ambulate at least 71ft with LRAD, ModI to demonstrate improved mobility  Baseline: 77ft // bars + MinA; 115' with supervision  Goal status: NOT MET  Patient will complete sit <> stand with LRAD. SBA for improved independence with transfers Baseline: MinA; CGA Goal status: NOT MET  Patient will complete floor transfer without assist as initial step of fall recovery  Baseline: unable to complete on eval; MinA Goal status: NOT MET  5. Patient will don/doff prosthetics independently   Baseline: MaxA, independent   Goal status: MET   NEW SHORT TERM GOALS: 08/07/23 1.Pt will be independent with new initial HEP for improved gait mechanics and balance  Baseline: to be updated; provided; has not gone back to doing sink HEP, CNA has been off for holiday (6/20) Goal status: NOT MET  2. Patient will ambulate at least 167ft with LRAD, supervision to demonstrate improved mobility  Baseline: 56ft // bars + MinA; 115' with supervision; 56' 2WW CGA (6/20) Goal status:  NOT MET  3.Patient will complete sit <> stand with LRAD ModI for improved independence with transfers Baseline: CGA; min-modA to 2WW (6/20) Goal status: NOT MET  4. Patient will complete floor transfer without assist as initial step of fall recovery  Baseline:  MinA; MinA from 6 step Goal status: NOT MET  NEW LONG TERM GOALS: 09/28/23  1. Pt will be independent  with final HEP for improved functional strength and mobility  Baseline: to be updated Goal status: NEW  2. Patient will ambulate 270ft using LRAD and supervision for improved limited community access Baseline: 115' with supervision/CGA + RW Goal status: NEW  3.  Patient will complete standing dynamic task ModI for at least 5 mins for greater independence with ADLs Baseline: ~67mins with CGA Goal status: NEW     ASSESSMENT:  CLINICAL IMPRESSION: Emphasis of skilled session on addressing POC with plan to discharge at last scheduled appt until patient receives new prosthetic componentry.  She is in agreement to this and goal of working on ambulation and increased wear schedule at home.  She would benefit from review of floor recovery and safe controlled falls for direction of care at home in coming visit.  Continue POC.   OBJECTIVE IMPAIRMENTS: Abnormal gait, decreased activity tolerance, decreased balance, decreased endurance, decreased knowledge of condition, decreased knowledge of use of DME, decreased strength, impaired vision/preception, and prosthetic dependency .   ACTIVITY LIMITATIONS: carrying, lifting, standing, squatting, transfers, hygiene/grooming, locomotion level, and caring for others  PARTICIPATION LIMITATIONS: meal prep, cleaning, interpersonal relationship, driving, shopping, community activity, and occupation  PERSONAL FACTORS: Age, Behavior pattern, Past/current experiences, Social background, Time since onset of injury/illness/exacerbation, Transportation, and 3+ comorbidities: see above are also affecting patient's functional outcome.   REHAB POTENTIAL: Good  CLINICAL DECISION MAKING: Stable/uncomplicated  EVALUATION COMPLEXITY: Low  PLAN:  PT FREQUENCY: 2x/week  PT DURATION: 12 weeks  PLANNED INTERVENTIONS: 97164- PT Re-evaluation, 97110-Therapeutic exercises, 97530- Therapeutic activity, 97112- Neuromuscular re-education, 97535- Self Care, [id number]- Manual  therapy, [phone]- Gait training, [phone]- Prosthetic training, [phone]- Aquatic Therapy, Patient/Family education, Balance training, Stair training, Scar mobilization, Vestibular training, Visual/preceptual remediation/compensation, DME instructions, and Wheelchair mobility training  PLAN FOR NEXT SESSION: Practice falling. D/C at 7/29 appt   Daved KATHEE Bull, PT, DPT  09/04/2023, 1:54 PM

## 2023-09-10 ENCOUNTER — Other Ambulatory Visit (HOSPITAL_COMMUNITY): Payer: Self-pay | Admitting: Nephrology

## 2023-09-10 DIAGNOSIS — N184 Chronic kidney disease, stage 4 (severe): Secondary | ICD-10-CM

## 2023-09-11 ENCOUNTER — Ambulatory Visit

## 2023-09-11 DIAGNOSIS — R2689 Other abnormalities of gait and mobility: Secondary | ICD-10-CM

## 2023-09-11 DIAGNOSIS — M6281 Muscle weakness (generalized): Secondary | ICD-10-CM | POA: Diagnosis not present

## 2023-09-11 DIAGNOSIS — R293 Abnormal posture: Secondary | ICD-10-CM

## 2023-09-11 NOTE — Therapy (Signed)
 OUTPATIENT PHYSICAL THERAPY NEURO TREATMENT   Patient Name: Kelli Hudson MRN: [id number] DOB:1990/04/10, 33 y.o., female Today's Date: 09/11/2023   PCP: Daphne Lesches, NP REFERRING PROVIDER: Jerona Sage, MD  END OF SESSION:  PT End of Session - 09/11/23 1318     Visit Number 36    Number of Visits 49    Date for PT Re-Evaluation 10/02/23    Authorization Type Medicare/Medicaid    Progress Note Due on Visit 40    PT Start Time 1316    PT Stop Time 1401    PT Time Calculation (min) 45 min    Equipment Utilized During Treatment Gait belt    Activity Tolerance Patient tolerated treatment well    Behavior During Therapy WFL for tasks assessed/performed          Past Medical History:  Diagnosis Date   Acute H. pylori gastric ulcer    Coffee ground emesis    Diabetes mellitus (HCC)    Diabetic gastroparesis (HCC)    DKA (diabetic ketoacidosis) (HCC) 02/24/2021   Gastroparesis    GERD (gastroesophageal reflux disease)    Hypertension    Hyperthyroidism    Intractable nausea and vomiting 04/20/2021   Normocytic anemia 04/20/2020   Prolonged Q-T interval on ECG    Past Surgical History:  Procedure Laterality Date   AMPUTATION Bilateral 10/24/2022   Procedure: BILATERAL BELOW KNEE AMPUTATION;  Surgeon: Sage Jerona GAILS, MD;  Location: [facility] OR;  Service: Orthopedics;  Laterality: Bilateral;   AMPUTATION Bilateral 10/08/2022   Procedure: BILATERAL LEG DEBRIDEMENT;  Surgeon: Sage Jerona GAILS, MD;  Location: [facility] OR;  Service: Orthopedics;  Laterality: Bilateral;   AMPUTATION Bilateral 11/14/2022   Procedure: BILATERAL ABOVE KNEE AMPUTATION;  Surgeon: Sage Jerona GAILS, MD;  Location: [facility] OR;  Service: Orthopedics;  Laterality: Bilateral;   AMPUTATION TOE Left 03/10/2018   Procedure: AMPUTATION FIFTH TOE;  Surgeon: Gershon Donnice SAUNDERS, DPM;  Location: [facility];  Service: Podiatry;  Laterality: Left;   APPLICATION OF WOUND VAC Bilateral 10/12/2022   Procedure:  APPLICATION OF WOUND VAC BILATERAL LEGS;  Surgeon: Celena Sharper, MD;  Location: [facility] OR;  Service: Orthopedics;  Laterality: Bilateral;   APPLICATION OF WOUND VAC Bilateral 10/24/2022   Procedure: APPLICATION OF WOUND VAC;  Surgeon: Sage Jerona GAILS, MD;  Location: [facility] OR;  Service: Orthopedics;  Laterality: Bilateral;   BIOPSY  01/28/2020   Procedure: BIOPSY;  Surgeon: Elicia Claw, MD;  Location: [facility];  Service: Gastroenterology;;   BOTOX  INJECTION  08/26/2021   Procedure: BOTOX  INJECTION;  Surgeon: Legrand Victory LITTIE DOUGLAS, MD;  Location: [facility];  Service: Gastroenterology;;   ESOPHAGOGASTRODUODENOSCOPY N/A 01/28/2020   Procedure: ESOPHAGOGASTRODUODENOSCOPY (EGD);  Surgeon: Elicia Claw, MD;  Location: [facility];  Service: Gastroenterology;  Laterality: N/A;   ESOPHAGOGASTRODUODENOSCOPY (EGD) WITH PROPOFOL  Left 09/08/2015   Procedure: ESOPHAGOGASTRODUODENOSCOPY (EGD) WITH PROPOFOL ;  Surgeon: Elsie Cree, MD;  Location: [facility] END[facility]COPY;  Service: Endoscopy;  Laterality: Left;   ESOPHAGOGASTRODUODENOSCOPY (EGD) WITH PROPOFOL  N/A 08/26/2021   Procedure: ESOPHAGOGASTRODUODENOSCOPY (EGD) WITH PROPOFOL ;  Surgeon: Legrand Victory LITTIE DOUGLAS, MD;  Location: [facility];  Service: Gastroenterology;  Laterality: N/A;   GASTRIC STIMULATOR IMPLANT SURGERY  06/2022   at [facility]   I & D EXTREMITY Bilateral 10/12/2022   Procedure: IRRIGATION AND  DEBRIDEMENT  BILATERAL LEGS;  Surgeon: Celena Sharper, MD;  Location: [facility] OR;  Service: Orthopedics;  Laterality: Bilateral;   I & D EXTREMITY Right 10/13/2022   Procedure: RIGHT THIGH WOUND VAC EXCHANGE;  Surgeon: Kendal Franky SQUIBB, MD;  Location: [facility] OR;  Service: Orthopedics;  Laterality: Right;   I & D EXTREMITY Bilateral 10/17/2022   Procedure: BILATERAL THIGH AND LEG DEBRIDEMENT, PARTIAL BILATERAL FOOT AMPUTATION;  Surgeon: Harden Jerona GAILS, MD;  Location: [facility] OR;  Service: Orthopedics;  Laterality: Bilateral;   I & D EXTREMITY Bilateral 11/05/2022   Procedure:  BILATERAL THIGH DEBRIDEMENT;  Surgeon: Harden Jerona GAILS, MD;  Location: [facility] OR;  Service: Orthopedics;  Laterality: Bilateral;   I & D EXTREMITY Bilateral 11/07/2022   Procedure: BILATERAL THIGH AND LEG DEBRIDEMENT;  Surgeon: Harden Jerona GAILS, MD;  Location: [facility] OR;  Service: Orthopedics;  Laterality: Bilateral;   I & D EXTREMITY Bilateral 11/12/2022   Procedure: BILATERAL LEG DEBRIDEMENTS;  Surgeon: Harden Jerona GAILS, MD;  Location: [facility] OR;  Service: Orthopedics;  Laterality: Bilateral;   IRRIGATION AND DEBRIDEMENT FOOT Bilateral 10/05/2022   Procedure: IRRIGATION AND DEBRIDEMENT FOOT;  Surgeon: Janit Thresa HERO, DPM;  Location: [facility];  Service: Orthopedics/Podiatry;  Laterality: Bilateral;   WISDOM TOOTH EXTRACTION     Patient Active Problem List   Diagnosis Date Noted   Influenza A 04/05/2023   Elevated lipase 04/05/2023   Esophagitis 03/24/2023   SIRS (systemic inflammatory response syndrome) (HCC) 03/02/2023   Fever 03/02/2023   Metabolic acidosis 03/02/2023   Hypertensive urgency 02/21/2023   Chronic pain 02/21/2023   Abscess of thigh 11/28/2022   C. 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   Hypokalemia 11/27/2022   Effusion, left knee 11/05/2022   MDD (major depressive disorder), recurrent episode, moderate (HCC) 10/16/2022   Necrotizing fasciitis of pelvic region and thigh (HCC) 10/08/2022   Necrotizing fasciitis of lower leg (HCC) 10/08/2022   Necrotizing fasciitis (HCC) 10/08/2022   Sepsis due to cellulitis (HCC) 10/03/2022   Cellulitis of lower extremity 10/03/2022   Multinodular goiter 06/18/2022   Malnutrition of moderate degree 04/18/2022   Intractable vomiting with nausea 04/17/2022   DKA (diabetic ketoacidosis) (HCC) 03/04/2022   Nausea vomiting and diarrhea 12/25/2021   Diabetic gastroparesis (HCC) 10/09/2021   Drug-seeking behavior 09/21/2021   Essential hypertension 08/25/2021   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) 08/25/2021   GERD without esophagitis 08/25/2021   Hematemesis    Prolonged Q-T interval on ECG    Nausea and vomiting 07/20/2021   Anemia of chronic disease 06/06/2021   Dehydration 06/04/2021   Hypomagnesemia 04/21/2021   Ulcerated, foot, left, with fat layer exposed (HCC) 04/20/2021   Uncontrolled type 1 diabetes mellitus with hyperglycemia, with long-term current use of insulin  (HCC) 12/18/2020   DM type 1 (diabetes mellitus, type 1) (HCC) 12/18/2020   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) 11/09/2020   Diabetic retinopathy of both eyes associated with type 1 diabetes mellitus (HCC) 09/24/2020   Diabetic gastroparesis associated with type 1 diabetes mellitus (HCC) 08/12/2020   Acute kidney injury superimposed on chronic kidney disease (HCC) 07/25/2020   Generalized abdominal pain 07/23/2020   GERD (gastroesophageal reflux disease) 07/23/2020   Diabetic nephropathy associated with type 1 diabetes mellitus (HCC) 06/27/2020   Sepsis (HCC) 05/27/2020   HLD (hyperlipidemia) 05/23/2020   Sinus tachycardia 05/09/2020   Anemia 04/20/2020   Depression with anxiety 07/05/2019   Diabetic polyneuropathy associated with type 1 diabetes mellitus (HCC) 09/24/2017   Gastroparesis    Goiter 10/05/2009   DM2 (diabetes mellitus, type 2) (HCC) 10/05/2009   Hypertension associated with diabetes (HCC) 10/05/2009  ONSET DATE: 04/07/23 referral  REFERRING DIAG: [phone] (ICD-10-CM) - S/P AKA (above knee amputation) bilateral (HCC)   THERAPY DIAG:  Muscle weakness (generalized)  Other abnormalities of gait and mobility  Abnormal posture  Rationale for Evaluation and Treatment: Rehabilitation  SUBJECTIVE:                                                                                                                                                                                             SUBJECTIVE STATEMENT: Patient arrives to clinic with SO. Had  her eyes dilated earlier today. Has a fitting with [facility] next week- has not heard back about [facility] knees. Did stand in her prosthetics a few times at home, but has not walked in them. Denies falls. BG is 221.   Pt accompanied by: friend - Drew  PERTINENT HISTORY: DM, diabetic gastroparesis, GERD, HTN, hyperthyroidism  PAIN:  Are you having pain? No   RUE BP: There were no vitals filed for this visit.   PRECAUTIONS: Fall; B diabetic retinopathy  PATIENT GOALS: to be able to walk again                                                                                                                              TREATMENT Theract: -fall practice and recovery:  -patient donned B prosthetics with extra time, but no additional support   -sit > stand with RW + close supervision   -90* turn on floor mat with RW + CGA  -controlled fall onto bottom with emphasis on chin tuck, moving RW away (forward) -reviewed steps once on the ground:  -assess for injuries/ bleeding and assess for cause of fall   -doff prosthetics   -acquire step (kept on floor in her house) and wc    -set up step in front of locked wc  -bump up to 6 step with supervision/ModI -bump up to wc with close supervision   PATIENT EDUCATION: Education details: continue HEP, fall precautions/recovery (see above), PT POC Person educated: Patient and friend Education method: Explanation and Demonstration Education comprehension: verbalized understanding  and needs further education  HOME EXERCISE PROGRAM: -practice donning/doffing prosthetic Access Code: [email or web address] URL: [email or web address] Date: 04/20/2023 Prepared by: Delon Pop  Exercises - Prone Hip Extension with Residual Limb (BKA)  - 1 x daily - 7 x weekly - 3 sets - 10 reps - Sidelying Hip Abduction (AKA)  - 1 x daily - 7 x weekly - 3 sets - 10 reps - Supine Piriformis Stretch with Residual Limb (AKA)  - 1 x daily - 7 x weekly - 3 sets - 10  reps  Do each exercise 1-2  times per day Do each exercise 10 repetitions Hold each exercise for 2 seconds to feel your location  AT SINK FIND YOUR MIDLINE POSITION AND PLACE FEET EQUAL DISTANCE FROM THE MIDLINE.  Try to find this position when standing still for activities.   USE TAPE ON FLOOR TO MARK THE MIDLINE POSITION. You also should try to feel with your limb pressure in socket.  You are trying to feel with limb what you used to feel with the bottom of your foot.  Side to Side Shift: Moving your hips only (not shoulders): move weight onto your left leg, HOLD/FEEL.  Move back to equal weight on each leg, HOLD/FEEL. Move weight onto your right leg, HOLD/FEEL. Move back to equal weight on each leg, HOLD/FEEL. Repeat.  Start with both hands on sink, progress to right hand only, then no hands.  Front to Back Shift: Moving your hips only (not shoulders): move your weight forward onto your toes, HOLD/FEEL. Move your weight back to equal Flat Foot on both legs, HOLD/FEEL. Move your weight back onto your heels, HOLD/FEEL. Move your weight back to equal on both legs, HOLD/FEEL. Repeat.   tart with both hands on sink, progress to right hand only, then no hands. Moving Cones / Cups: With equal weight on each leg: Hold on with one hand the first time, then progress to no hand supports. Move cups from one side of sink to the other. Place cups ~2" out of your reach, progress to 10" beyond reach.  Place one hand in middle of sink and reach with other hand. Do both arms.  Then hover one hand and move cups with other hand. -just use a crossbody reach to end of safe weight shift ROM Overhead/Upward Reaching: alternated reaching up to top cabinets or ceiling if no cabinets present. Keep equal weight on each leg. Start with one hand support on counter while other hand reaches and progress to no hand support with reaching.  ace one hand in middle of sink and reach with other hand. Do both arms.  Then hover one hand  and move cups with other hand.  5.   Looking Over Shoulders: With equal weight on each leg: alternate turning to look over your shoulders with one hand support on counter as needed.  Start with head motions only to look in front of shoulder, then even with shoulder and progress to looking behind you. To look to side, move head /eyes, then shoulder on side looking pulls back, shift more weight to side looking and pull hip back. ace one hand in middle of sink and let go with other hand so your shoulder can pull back. Switch hands to look other way.   Then hover one hand and move cups with other hand.  6.  Stepping with leg that is not amputated:  Move items under cabinet out of your way. Shift your hips/pelvis so weight on prosthesis. SLOWLY step  other leg so front of foot is in cabinet. Then step back to floor. - just hike the hip and place the foot back in the starting spot  GOALS: Goals reviewed with patient? Yes  SHORT TERM GOALS: Target date: 05/15/23  Pt will be independent with initial HEP for improved gait mechanics and balance  Baseline: to be updated, reports she has not been doing at home doesn't trust CNA Goal status: NOT MET  2.  Patient will ambulate at least 45ft with LRAD and no more than MinA to demonstrate improved mobility  Baseline: 3ft // bars + MinA, 10 feet with 2WW + minA Goal status: NOT met  3.  Patient will complete sit <> stand with LRAD and no more than CGA for improved independence with transfers Baseline: MinA, required minA in today's session  Goal status: NOT met - required minA due to posterior instability  4.  Patient will complete floor transfer with assist as initial step of fall recovery  Baseline: unable to complete on eval; MinA Goal status: MET   LONG TERM GOALS: Target date: 07/10/23  Pt will be independent with final HEP for improved gait mechanics and balance  Baseline: to be updated; provided Goal status: MET  Patient will ambulate at least  32ft with LRAD, ModI to demonstrate improved mobility  Baseline: 75ft // bars + MinA; 115' with supervision  Goal status: NOT MET  Patient will complete sit <> stand with LRAD. SBA for improved independence with transfers Baseline: MinA; CGA Goal status: NOT MET  Patient will complete floor transfer without assist as initial step of fall recovery  Baseline: unable to complete on eval; MinA Goal status: NOT MET  5. Patient will don/doff prosthetics independently   Baseline: MaxA, independent   Goal status: MET   NEW SHORT TERM GOALS: 08/07/23 1.Pt will be independent with new initial HEP for improved gait mechanics and balance  Baseline: to be updated; provided; has not gone back to doing sink HEP, CNA has been off for holiday (6/20) Goal status: NOT MET  2. Patient will ambulate at least 149ft with LRAD, supervision to demonstrate improved mobility  Baseline: 33ft // bars + MinA; 115' with supervision; 56' 2WW CGA (6/20) Goal status:  NOT MET  3.Patient will complete sit <> stand with LRAD ModI for improved independence with transfers Baseline: CGA; min-modA to 2WW (6/20) Goal status: NOT MET  4. Patient will complete floor transfer without assist as initial step of fall recovery  Baseline:  MinA; MinA from 6 step Goal status: NOT MET  NEW LONG TERM GOALS: 09/28/23  1. Pt will be independent with final HEP for improved functional strength and mobility  Baseline: to be updated Goal status: NEW  2. Patient will ambulate 230ft using LRAD and supervision for improved limited community access Baseline: 115' with supervision/CGA + RW Goal status: NEW  3. Patient will complete standing dynamic task ModI for at least 5 mins for greater independence with ADLs Baseline: ~78mins with CGA Goal status: NEW     ASSESSMENT:  CLINICAL IMPRESSION: Patient seen for skilled PT session with emphasis on practicing falling and fall recovery. She has primarily demonstrated posterior LOB  and instability, and so posterior falls were practiced. After initial instruction, she required only close supervision and intermittent verbal cues for set up (her eyes were dilated this AM prior to the session, further impacting her already impaired vision). Afterward, patient reports increased confidence with standing/ambulating at home knowing that she can safely and  relatively independently get off the floor. She also reports confidence in directing others to help her set up a floor transfer should it be necessary. Continue POC.   OBJECTIVE IMPAIRMENTS: Abnormal gait, decreased activity tolerance, decreased balance, decreased endurance, decreased knowledge of condition, decreased knowledge of use of DME, decreased strength, impaired vision/preception, and prosthetic dependency .   ACTIVITY LIMITATIONS: carrying, lifting, standing, squatting, transfers, hygiene/grooming, locomotion level, and caring for others  PARTICIPATION LIMITATIONS: meal prep, cleaning, interpersonal relationship, driving, shopping, community activity, and occupation  PERSONAL FACTORS: Age, Behavior pattern, Past/current experiences, Social background, Time since onset of injury/illness/exacerbation, Transportation, and 3+ comorbidities: see above are also affecting patient's functional outcome.   REHAB POTENTIAL: Good  CLINICAL DECISION MAKING: Stable/uncomplicated  EVALUATION COMPLEXITY: Low  PLAN:  PT FREQUENCY: 2x/week  PT DURATION: 12 weeks  PLANNED INTERVENTIONS: 97164- PT Re-evaluation, 97110-Therapeutic exercises, 97530- Therapeutic activity, 97112- Neuromuscular re-education, 97535- Self Care, [id number]- Manual therapy, [phone]- Gait training, [phone]- Prosthetic training, [phone]- Aquatic Therapy, Patient/Family education, Balance training, Stair training, Scar mobilization, Vestibular training, Visual/preceptual remediation/compensation, DME instructions, and Wheelchair mobility training  PLAN FOR NEXT SESSION: D/C  at 7/29 appt   [name], PT [name], PT, DPT, CBIS  09/11/2023, 3:10 PM

## 2023-09-15 ENCOUNTER — Encounter: Payer: Self-pay | Admitting: Physical Therapy

## 2023-09-15 ENCOUNTER — Ambulatory Visit: Admitting: Physical Therapy

## 2023-09-15 VITALS — BP 131/87 | HR 84

## 2023-09-15 DIAGNOSIS — M6281 Muscle weakness (generalized): Secondary | ICD-10-CM

## 2023-09-15 DIAGNOSIS — R29898 Other symptoms and signs involving the musculoskeletal system: Secondary | ICD-10-CM

## 2023-09-15 DIAGNOSIS — R2689 Other abnormalities of gait and mobility: Secondary | ICD-10-CM

## 2023-09-15 DIAGNOSIS — R293 Abnormal posture: Secondary | ICD-10-CM

## 2023-09-15 NOTE — Therapy (Signed)
 OUTPATIENT PHYSICAL THERAPY NEURO TREATMENT - DISCHARGE SUMMARY   Patient Name: Kelli Hudson MRN: [id number] DOB:[birth year], 33 y.o., female Today's Date: 09/15/2023   PCP: Daphne Lesches, NP REFERRING PROVIDER: Jerona Sage, MD  PHYSICAL THERAPY DISCHARGE SUMMARY  Visits from Start of Care: 37  Current functional level related to goals / functional outcomes: See clinical impression statement.   Remaining deficits: Limited vision, limited endurance, needs for assistance to move within home due to fall risk   Education / Equipment: Continue HEP, see above for further, continue progressing wear schedule, progress towards goals, process for returning with new referral when new prosthetics obtained.   Patient agrees to discharge. Patient goals were partially met. Patient is being discharged due to maximized rehab potential.    END OF SESSION:  PT End of Session - 09/15/23 1329     Visit Number 37    Number of Visits 49    Date for PT Re-Evaluation 10/02/23    Authorization Type Medicare/Medicaid    Progress Note Due on Visit 40    PT Start Time 1316    PT Stop Time 1358    PT Time Calculation (min) 42 min    Equipment Utilized During Treatment Gait belt    Activity Tolerance Patient tolerated treatment well    Behavior During Therapy WFL for tasks assessed/performed          Past Medical History:  Diagnosis Date   Acute H. pylori gastric ulcer    Coffee ground emesis    Diabetes mellitus (HCC)    Diabetic gastroparesis (HCC)    DKA (diabetic ketoacidosis) (HCC) 02/24/2021   Gastroparesis    GERD (gastroesophageal reflux disease)    Hypertension    Hyperthyroidism    Intractable nausea and vomiting 04/20/2021   Normocytic anemia 04/20/2020   Prolonged Q-T interval on ECG    Past Surgical History:  Procedure Laterality Date   AMPUTATION Bilateral 10/24/2022   Procedure: BILATERAL BELOW KNEE AMPUTATION;  Surgeon: Sage Jerona GAILS, MD;  Location: [facility]  OR;  Service: Orthopedics;  Laterality: Bilateral;   AMPUTATION Bilateral 10/08/2022   Procedure: BILATERAL LEG DEBRIDEMENT;  Surgeon: Sage Jerona GAILS, MD;  Location: [facility] OR;  Service: Orthopedics;  Laterality: Bilateral;   AMPUTATION Bilateral 11/14/2022   Procedure: BILATERAL ABOVE KNEE AMPUTATION;  Surgeon: Sage Jerona GAILS, MD;  Location: [facility] OR;  Service: Orthopedics;  Laterality: Bilateral;   AMPUTATION TOE Left 03/10/2018   Procedure: AMPUTATION FIFTH TOE;  Surgeon: Gershon Donnice SAUNDERS, DPM;  Location: [facility];  Service: Podiatry;  Laterality: Left;   APPLICATION OF WOUND VAC Bilateral 10/12/2022   Procedure: APPLICATION OF WOUND VAC BILATERAL LEGS;  Surgeon: Celena Sharper, MD;  Location: [facility] OR;  Service: Orthopedics;  Laterality: Bilateral;   APPLICATION OF WOUND VAC Bilateral 10/24/2022   Procedure: APPLICATION OF WOUND VAC;  Surgeon: Sage Jerona GAILS, MD;  Location: [facility] OR;  Service: Orthopedics;  Laterality: Bilateral;   BIOPSY  01/28/2020   Procedure: BIOPSY;  Surgeon: Elicia Claw, MD;  Location: [facility];  Service: Gastroenterology;;   BOTOX  INJECTION  08/26/2021   Procedure: BOTOX  INJECTION;  Surgeon: Legrand Victory LITTIE DOUGLAS, MD;  Location: [facility];  Service: Gastroenterology;;   ESOPHAGOGASTRODUODENOSCOPY N/A 01/28/2020   Procedure: ESOPHAGOGASTRODUODENOSCOPY (EGD);  Surgeon: Elicia Claw, MD;  Location: [facility];  Service: Gastroenterology;  Laterality: N/A;   ESOPHAGOGASTRODUODENOSCOPY (EGD) WITH PROPOFOL  Left 09/08/2015   Procedure: ESOPHAGOGASTRODUODENOSCOPY (EGD) WITH PROPOFOL ;  Surgeon: Elsie Cree, MD;  Location: [facility] ENDOSCOPY;  Service:  Endoscopy;  Laterality: Left;   ESOPHAGOGASTRODUODENOSCOPY (EGD) WITH PROPOFOL  N/A 08/26/2021   Procedure: ESOPHAGOGASTRODUODENOSCOPY (EGD) WITH PROPOFOL ;  Surgeon: Legrand Victory LITTIE DOUGLAS, MD;  Location: [facility];  Service: Gastroenterology;  Laterality: N/A;   GASTRIC STIMULATOR IMPLANT SURGERY  06/2022   at [facility]    I & D EXTREMITY Bilateral 10/12/2022   Procedure: IRRIGATION AND  DEBRIDEMENT  BILATERAL LEGS;  Surgeon: Celena Sharper, MD;  Location: [facility] OR;  Service: Orthopedics;  Laterality: Bilateral;   I & D EXTREMITY Right 10/13/2022   Procedure: RIGHT THIGH WOUND VAC EXCHANGE;  Surgeon: Kendal Franky SQUIBB, MD;  Location: [facility] OR;  Service: Orthopedics;  Laterality: Right;   I & D EXTREMITY Bilateral 10/17/2022   Procedure: BILATERAL THIGH AND LEG DEBRIDEMENT, PARTIAL BILATERAL FOOT AMPUTATION;  Surgeon: Harden Jerona GAILS, MD;  Location: [facility] OR;  Service: Orthopedics;  Laterality: Bilateral;   I & D EXTREMITY Bilateral 11/05/2022   Procedure: BILATERAL THIGH DEBRIDEMENT;  Surgeon: Harden Jerona GAILS, MD;  Location: [facility] OR;  Service: Orthopedics;  Laterality: Bilateral;   I & D EXTREMITY Bilateral 11/07/2022   Procedure: BILATERAL THIGH AND LEG DEBRIDEMENT;  Surgeon: Harden Jerona GAILS, MD;  Location: [facility] OR;  Service: Orthopedics;  Laterality: Bilateral;   I & D EXTREMITY Bilateral 11/12/2022   Procedure: BILATERAL LEG DEBRIDEMENTS;  Surgeon: Harden Jerona GAILS, MD;  Location[facility] OR;  Service: Orthopedics;  Laterality: Bilateral;   IRRIGATION AND DEBRIDEMENT FOOT Bilateral 10/05/2022   Procedure: IRRIGATION AND DEBRIDEMENT FOOT;  Surgeon: Janit Thresa HERO, DPM;  Location: [facility] ORS;  Service: Orthopedics/Podiatry;  Laterality: Bilateral;   WISDOM TOOTH EXTRACTION     Patient Active Problem List   Diagnosis Date Noted   Influenza A 04/05/2023   Elevated lipase 04/05/2023   Esophagitis 03/24/2023   SIRS (systemic inflammatory response syndrome) (HCC) 03/02/2023   Fever 03/02/2023   Metabolic acidosis 03/02/2023   Hypertensive urgency 02/21/2023   Chronic pain 02/21/2023   Abscess of thigh 11/28/2022   C. 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   Hypokalemia 11/27/2022   Effusion, left knee 11/05/2022   MDD (major depressive disorder), recurrent episode, moderate (HCC)  10/16/2022   Necrotizing fasciitis of pelvic region and thigh (HCC) 10/08/2022   Necrotizing fasciitis of lower leg (HCC) 10/08/2022   Necrotizing fasciitis (HCC) 10/08/2022   Sepsis due to cellulitis (HCC) 10/03/2022   Cellulitis of lower extremity 10/03/2022   Multinodular goiter 06/18/2022   Malnutrition of moderate degree 04/18/2022   Intractable vomiting with nausea 04/17/2022   DKA (diabetic ketoacidosis) (HCC) 03/04/2022   Nausea vomiting and diarrhea 12/25/2021   Diabetic gastroparesis (HCC) 10/09/2021   Drug-seeking behavior 09/21/2021   Essential hypertension 08/25/2021   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) 08/25/2021   GERD without esophagitis 08/25/2021   Hematemesis    Prolonged Q-T interval on ECG    Nausea and vomiting 07/20/2021   Anemia of chronic disease 06/06/2021   Dehydration 06/04/2021   Hypomagnesemia 04/21/2021   Ulcerated, foot, left, with fat layer exposed (HCC) 04/20/2021   Uncontrolled type 1 diabetes mellitus with hyperglycemia, with long-term current use of insulin  (HCC) 12/18/2020   DM type 1 (diabetes mellitus, type 1) (HCC) 12/18/2020   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) 11/09/2020   Diabetic retinopathy of both eyes associated with type 1 diabetes mellitus (HCC) 09/24/2020   Diabetic gastroparesis associated with type 1 diabetes mellitus (HCC) 08/12/2020   Acute  kidney injury superimposed on chronic kidney disease (HCC) 07/25/2020   Generalized abdominal pain 07/23/2020   GERD (gastroesophageal reflux disease) 07/23/2020   Diabetic nephropathy associated with type 1 diabetes mellitus (HCC) 06/27/2020   Sepsis (HCC) 05/27/2020   HLD (hyperlipidemia) 05/23/2020   Sinus tachycardia 05/09/2020   Anemia 04/20/2020   Depression with anxiety 07/05/2019   Diabetic polyneuropathy associated with type 1 diabetes mellitus (HCC) 09/24/2017   Gastroparesis    Goiter 10/05/2009   DM2 (diabetes mellitus, type 2)  (HCC) 10/05/2009   Hypertension associated with diabetes (HCC) 10/05/2009    ONSET DATE: 04/07/23 referral  REFERRING DIAG: S10.388,S10.387 (ICD-10-CM) - S/P AKA (above knee amputation) bilateral (HCC)   THERAPY DIAG:  Muscle weakness (generalized)  Other abnormalities of gait and mobility  Abnormal posture  Other symptoms and signs involving the musculoskeletal system  Rationale for Evaluation and Treatment: Rehabilitation  SUBJECTIVE:                                                                                                                                                                                             SUBJECTIVE STATEMENT: Patient arrives to clinic alone via transportation w/ prosthetics donned.  Has a fitting with [facility] 7/31- has not heard back about MPK knees. She has been wearing her prosthetics since 9am. Denies falls. BG is 216 and trending down as she took insulin .  She is tearful at onset of session due to recently being told her kidney function has worsened and she may need to go on dialysis which she is scared of.  She wonders if there is an alternative but has a kidney biopsy scheduled on 8/15.  Pt accompanied by: self   PERTINENT HISTORY: DM, diabetic gastroparesis, GERD, HTN, hyperthyroidism  PAIN:  Are you having pain? No   LUE BP: Vitals:   09/15/23 1327  BP: 131/87  Pulse: 84     PRECAUTIONS: Fall; B diabetic retinopathy  PATIENT GOALS: to be able to walk again  TREATMENT Provided therapeutic use of self for encouragement regarding kidney status change.  Discussed goals of PT and orthotist care to progress towards most functional prosthetics for her - MPK and things to consider if on dialysis like BP fluctuation, etc for safety.  Pt verbalizes understanding.  Encouraged dialogue with her kidney doctor to improve  her confidence in process. Time spent adjusting R prosthetic into neutral position - pt had a hard time adjusting it this morning due to vision, min cues Pt ambulates SBA-CGA x115' continuously w/ 2WW and wheelchair follow.  She has 2 instances of poor R prosthetic placement independent in adjustment before continuing.  Time taken to complete loop 8 minutes and 26 seconds.  PATIENT EDUCATION: Education details: Continue HEP, see above for further, continue progressing wear schedule, progress towards goals, process for returning with new referral when new prosthetics obtained. Person educated: Patient and friend Education method: Medical illustrator Education comprehension: verbalized understanding and needs further education  HOME EXERCISE PROGRAM: -practice donning/doffing prosthetic Access Code: [email or web address] URL: [email or web address] Date: 04/20/2023 Prepared by: Delon Pop  Exercises - Prone Hip Extension with Residual Limb (BKA)  - 1 x daily - 7 x weekly - 3 sets - 10 reps - Sidelying Hip Abduction (AKA)  - 1 x daily - 7 x weekly - 3 sets - 10 reps - Supine Piriformis Stretch with Residual Limb (AKA)  - 1 x daily - 7 x weekly - 3 sets - 10 reps  Do each exercise 1-2  times per day Do each exercise 10 repetitions Hold each exercise for 2 seconds to feel your location  AT SINK FIND YOUR MIDLINE POSITION AND PLACE FEET EQUAL DISTANCE FROM THE MIDLINE.  Try to find this position when standing still for activities.   USE TAPE ON FLOOR TO MARK THE MIDLINE POSITION. You also should try to feel with your limb pressure in socket.  You are trying to feel with limb what you used to feel with the bottom of your foot.  Side to Side Shift: Moving your hips only (not shoulders): move weight onto your left leg, HOLD/FEEL.  Move back to equal weight on each leg, HOLD/FEEL. Move weight onto your right leg, HOLD/FEEL. Move back to equal weight on each leg, HOLD/FEEL. Repeat.   Start with both hands on sink, progress to right hand only, then no hands.  Front to Back Shift: Moving your hips only (not shoulders): move your weight forward onto your toes, HOLD/FEEL. Move your weight back to equal Flat Foot on both legs, HOLD/FEEL. Move your weight back onto your heels, HOLD/FEEL. Move your weight back to equal on both legs, HOLD/FEEL. Repeat.   tart with both hands on sink, progress to right hand only, then no hands. Moving Cones / Cups: With equal weight on each leg: Hold on with one hand the first time, then progress to no hand supports. Move cups from one side of sink to the other. Place cups ~2" out of your reach, progress to 10" beyond reach.  Place one hand in middle of sink and reach with other hand. Do both arms.  Then hover one hand and move cups with other hand. -just use a crossbody reach to end of safe weight shift ROM Overhead/Upward Reaching: alternated reaching up to top cabinets or ceiling if no cabinets present. Keep equal weight on each leg. Start with one hand support on counter while other hand reaches and progress to no hand support with reaching.  ace one hand in  middle of sink and reach with other hand. Do both arms.  Then hover one hand and move cups with other hand.  5.   Looking Over Shoulders: With equal weight on each leg: alternate turning to look over your shoulders with one hand support on counter as needed.  Start with head motions only to look in front of shoulder, then even with shoulder and progress to looking behind you. To look to side, move head /eyes, then shoulder on side looking pulls back, shift more weight to side looking and pull hip back. ace one hand in middle of sink and let go with other hand so your shoulder can pull back. Switch hands to look other way.   Then hover one hand and move cups with other hand.  6.  Stepping with leg that is not amputated:  Move items under cabinet out of your way. Shift your hips/pelvis so weight on prosthesis.  SLOWLY step other leg so front of foot is in cabinet. Then step back to floor. - just hike the hip and place the foot back in the starting spot  GOALS: Goals reviewed with patient? Yes  SHORT TERM GOALS: Target date: 05/15/23  Pt will be independent with initial HEP for improved gait mechanics and balance  Baseline: to be updated, reports she has not been doing at home doesn't trust CNA Goal status: NOT MET  2.  Patient will ambulate at least 47ft with LRAD and no more than MinA to demonstrate improved mobility  Baseline: 96ft // bars + MinA, 10 feet with 2WW + minA Goal status: NOT met  3.  Patient will complete sit <> stand with LRAD and no more than CGA for improved independence with transfers Baseline: MinA, required minA in today's session  Goal status: NOT met - required minA due to posterior instability  4.  Patient will complete floor transfer with assist as initial step of fall recovery  Baseline: unable to complete on eval; MinA Goal status: MET   LONG TERM GOALS: Target date: 07/10/23  Pt will be independent with final HEP for improved gait mechanics and balance  Baseline: to be updated; provided Goal status: MET  Patient will ambulate at least 27ft with LRAD, ModI to demonstrate improved mobility  Baseline: 74ft // bars + MinA; 115' with supervision  Goal status: NOT MET  Patient will complete sit <> stand with LRAD. SBA for improved independence with transfers Baseline: MinA; CGA Goal status: NOT MET  Patient will complete floor transfer without assist as initial step of fall recovery  Baseline: unable to complete on eval; MinA Goal status: NOT MET  5. Patient will don/doff prosthetics independently   Baseline: MaxA, independent   Goal status: MET   NEW SHORT TERM GOALS: 08/07/23 1.Pt will be independent with new initial HEP for improved gait mechanics and balance  Baseline: to be updated; provided; has not gone back to doing sink HEP, CNA has been off for  holiday (6/20) Goal status: NOT MET  2. Patient will ambulate at least 128ft with LRAD, supervision to demonstrate improved mobility  Baseline: 79ft // bars + MinA; 115' with supervision; 56' 2WW CGA (6/20) Goal status:  NOT MET  3.Patient will complete sit <> stand with LRAD ModI for improved independence with transfers Baseline: CGA; min-modA to 2WW (6/20) Goal status: NOT MET  4. Patient will complete floor transfer without assist as initial step of fall recovery  Baseline:  MinA; MinA from 6 step Goal status: NOT MET  NEW LONG TERM GOALS: 09/28/23  1. Pt will be independent with final HEP for improved functional strength and mobility  Baseline: Pt is working towards ind w/ standing portion of HEP, but compliant (7/29) Goal status: MET  2. Patient will ambulate 212ft using LRAD and supervision for improved limited community access Baseline: 115' with supervision/CGA + RW; 115' SBA-CGA w/ 2WW (7/29) Goal status: NOT MET  3. Patient will complete standing dynamic task ModI for at least 5 mins for greater independence with ADLs Baseline: ~55mins with CGA; ambulates x115' over 8 minutes and 26 seconds SBA-CGA (7/29) Goal status: PARTIALLY MET    ASSESSMENT:  CLINICAL IMPRESSION: Assessed LTGs this visit in preparation for discharge.  She is able to ambulate 115 ft continuously today at mostly SBA level with current prosthetics.  She is at her maximum potential with current prosthetics in this setting, has assigned balance and ambulatory tasks to work on at home with caregivers, and PT is awaiting orthotic confirmation that her prosthetics will be upgraded once insurance approves.  She is due to fitting appt with [facility] 7/31.  Pt to return for re-evaluation once new prosthetics obtained.  She is in agreement to discharge at this time.   OBJECTIVE IMPAIRMENTS: Abnormal gait, decreased activity tolerance, decreased balance, decreased endurance, decreased knowledge of condition,  decreased knowledge of use of DME, decreased strength, impaired vision/preception, and prosthetic dependency .   ACTIVITY LIMITATIONS: carrying, lifting, standing, squatting, transfers, hygiene/grooming, locomotion level, and caring for others  PARTICIPATION LIMITATIONS: meal prep, cleaning, interpersonal relationship, driving, shopping, community activity, and occupation  PERSONAL FACTORS: Age, Behavior pattern, Past/current experiences, Social background, Time since onset of injury/illness/exacerbation, Transportation, and 3+ comorbidities: see above are also affecting patient's functional outcome.   REHAB POTENTIAL: Good  CLINICAL DECISION MAKING: Stable/uncomplicated  EVALUATION COMPLEXITY: Low  PLAN:  PT FREQUENCY: 2x/week  PT DURATION: 12 weeks  PLANNED INTERVENTIONS: 97164- PT Re-evaluation, 97110-Therapeutic exercises, 97530- Therapeutic activity, 97112- Neuromuscular re-education, 97535- Self Care, [id number]- Manual therapy, [phone]- Gait training, [phone]- Prosthetic training, [phone]- Aquatic Therapy, Patient/Family education, Balance training, Stair training, Scar mobilization, Vestibular training, Visual/preceptual remediation/compensation, DME instructions, and Wheelchair mobility training  PLAN FOR NEXT SESSION: N/A   Daved KATHEE Bull, PT, DPT  09/15/2023, 3:20 PM

## 2023-10-01 ENCOUNTER — Other Ambulatory Visit: Payer: Self-pay | Admitting: Radiology

## 2023-10-01 DIAGNOSIS — N179 Acute kidney failure, unspecified: Secondary | ICD-10-CM

## 2023-10-01 NOTE — H&P (Signed)
 Chief Complaint: Renal failure and proteinuria; Request for random renal biopsy  Referring Provider(s): Lin,James W   Supervising Physician: Hughes Simmonds  Patient Status: [facility] - Out-pt  History of Present Illness: Kelli Hudson is a 33 y.o. female with PMH significant for DM, HTN, kidney disease. Dr. Dennise with [facility] requested kidney biopsy to continue work up or proteinuria and progressive renal disease; she presents today for this.   08/18/23 US :   Mild increased echogenicity of the renal cortices without significant thinning consistent with chronic kidney disease.   Confirms NPO since MN  and ride/supervision available for 24 hours.  Does not wear CPAP or use supplemental home O2.  Denies fever, chills, SOB, CP, sore throat, N/V, abd pain, blood in stool or urine, abnormal bruising, back pain.   Allergies Reviewed:  Ibuprofen  and Lisinopril    Patient is Full Code  Past Medical History:  Diagnosis Date   Acute H. pylori gastric ulcer    Coffee ground emesis    Diabetes mellitus (HCC)    Diabetic gastroparesis (HCC)    DKA (diabetic ketoacidosis) (HCC) 02/24/2021   Gastroparesis    GERD (gastroesophageal reflux disease)    Hypertension    Hyperthyroidism    Intractable nausea and vomiting 04/20/2021   Normocytic anemia 04/20/2020   Prolonged Q-T interval on ECG     Past Surgical History:  Procedure Laterality Date   AMPUTATION Bilateral 10/24/2022   Procedure: BILATERAL BELOW KNEE AMPUTATION;  Surgeon: Harden Jerona GAILS, MD;  Location: [facility] OR;  Service: Orthopedics;  Laterality: Bilateral;   AMPUTATION Bilateral 10/08/2022   Procedure: BILATERAL LEG DEBRIDEMENT;  Surgeon: Harden Jerona GAILS, MD;  Location: [facility] OR;  Service: Orthopedics;  Laterality: Bilateral;   AMPUTATION Bilateral 11/14/2022   Procedure: BILATERAL ABOVE KNEE AMPUTATION;  Surgeon: Harden Jerona GAILS, MD;  Location: [facility] OR;  Service: Orthopedics;  Laterality: Bilateral;   AMPUTATION  TOE Left 03/10/2018   Procedure: AMPUTATION FIFTH TOE;  Surgeon: Gershon Donnice SAUNDERS, DPM;  Location: [facility];  Service: Podiatry;  Laterality: Left;   APPLICATION OF WOUND VAC Bilateral 10/12/2022   Procedure: APPLICATION OF WOUND VAC BILATERAL LEGS;  Surgeon: Celena Sharper, MD;  Location: [facility] OR;  Service: Orthopedics;  Laterality: Bilateral;   APPLICATION OF WOUND VAC Bilateral 10/24/2022   Procedure: APPLICATION OF WOUND VAC;  Surgeon: Harden Jerona GAILS, MD;  Location: [facility] OR;  Service: Orthopedics;  Laterality: Bilateral;   BIOPSY  01/28/2020   Procedure: BIOPSY;  Surgeon: Elicia Claw, MD;  Location: [facility];  Service: Gastroenterology;;   BOTOX  INJECTION  08/26/2021   Procedure: BOTOX  INJECTION;  Surgeon: Legrand Victory LITTIE DOUGLAS, MD;  Location: [facility];  Service: Gastroenterology;;   ESOPHAGOGASTRODUODENOSCOPY N/A 01/28/2020   Procedure: ESOPHAGOGASTRODUODENOSCOPY (EGD);  Surgeon: Elicia Claw, MD;  Location: [facility];  Service: Gastroenterology;  Laterality: N/A;   ESOPHAGOGASTRODUODENOSCOPY (EGD) WITH PROPOFOL  Left 09/08/2015   Procedure: ESOPHAGOGASTRODUODENOSCOPY (EGD) WITH PROPOFOL ;  Surgeon: Elsie Cree, MD;  Location: [facility] END[facility]COPY;  Service: Endoscopy;  Laterality: Left;   ESOPHAGOGASTRODUODENOSCOPY (EGD) WITH PROPOFOL  N/A 08/26/2021   Procedure: ESOPHAGOGASTRODUODENOSCOPY (EGD) WITH PROPOFOL ;  Surgeon: Legrand Victory LITTIE DOUGLAS, MD;  Location: [facility];  Service: Gastroenterology;  Laterality: N/A;   GASTRIC STIMULATOR IMPLANT SURGERY  06/2022   at [facility]   I & D EXTREMITY Bilateral 10/12/2022   Procedure: IRRIGATION AND  DEBRIDEMENT  BILATERAL LEGS;  Surgeon: Celena Sharper, MD;  Location: [facility] OR;  Service: Orthopedics;  Laterality: Bilateral;   I &  D EXTREMITY Right 10/13/2022   Procedure: RIGHT THIGH WOUND VAC EXCHANGE;  Surgeon: Kendal Franky SQUIBB, MD;  Location: [facility] OR;  Service: Orthopedics;  Laterality: Right;   I & D EXTREMITY Bilateral 10/17/2022    Procedure: BILATERAL THIGH AND LEG DEBRIDEMENT, PARTIAL BILATERAL FOOT AMPUTATION;  Surgeon: Harden Jerona GAILS, MD;  Location: [facility] OR;  Service: Orthopedics;  Laterality: Bilateral;   I & D EXTREMITY Bilateral 11/05/2022   Procedure: BILATERAL THIGH DEBRIDEMENT;  Surgeon: Harden Jerona GAILS, MD;  Location: [facility] OR;  Service: Orthopedics;  Laterality: Bilateral;   I & D EXTREMITY Bilateral 11/07/2022   Procedure: BILATERAL THIGH AND LEG DEBRIDEMENT;  Surgeon: Harden Jerona GAILS, MD;  Location: [facility] OR;  Service: Orthopedics;  Laterality: Bilateral;   I & D EXTREMITY Bilateral 11/12/2022   Procedure: BILATERAL LEG DEBRIDEMENTS;  Surgeon: Harden Jerona GAILS, MD;  Location: [facility] OR;  Service: Orthopedics;  Laterality: Bilateral;   IRRIGATION AND DEBRIDEMENT FOOT Bilateral 10/05/2022   Procedure: IRRIGATION AND DEBRIDEMENT FOOT;  Surgeon: Janit Thresa HERO, DPM;  Location: [facility];  Service: Orthopedics/Podiatry;  Laterality: Bilateral;   WISDOM TOOTH EXTRACTION        Medications: Prior to Admission medications   Medication Sig Start Date End Date Taking? Authorizing Provider  acetaminophen  (TYLENOL ) 500 MG tablet Take 1 tablet (500 mg total) by mouth every 6 (six) hours as needed. 04/04/23   Nivia Colon, PA-C  amLODipine  (NORVASC ) 10 MG tablet Take 1 tablet (10 mg total) by mouth daily. 12/04/22   Elgergawy, Brayton RAMAN, MD  amoxicillin -clavulanate (AUGMENTIN ) 875-125 MG tablet Take 1 tablet by mouth every 12 (twelve) hours. 07/08/23   Hildegard Loge, PA-C  ascorbic acid  (VITAMIN C ) 1000 MG tablet Take 1 tablet (1,000 mg total) by mouth daily. Patient taking differently: Take 1,000 mg by mouth once a week. 11/22/22   Lue Elsie BROCKS, MD  benzonatate  (TESSALON ) 100 MG capsule Take 1 capsule (100 mg total) by mouth every 8 (eight) hours. 04/04/23   Nivia Colon, PA-C  carvedilol  (COREG ) 6.25 MG tablet Take 1 tablet (6.25 mg total) by mouth 2 (two) times daily with a meal. 12/03/22   Elgergawy, Brayton RAMAN, MD  Continuous Glucose  Transmitter (DEXCOM G6 TRANSMITTER) MISC 1 Device by Does not apply route as directed. 06/18/22   Shamleffer, Ibtehal Jaralla, MD  etonogestrel  (NEXPLANON ) 68 MG IMPL implant 1 each by Subdermal route continuous.    [provider]  famotidine  (PEPCID ) 20 MG tablet Take 1 tablet (20 mg total) by mouth 2 (two) times daily. 12/03/22   Elgergawy, Brayton RAMAN, MD  gabapentin  (NEURONTIN ) 300 MG capsule Take 1 capsule (300 mg total) by mouth every 8 (eight) hours. 11/22/22   Lue Elsie BROCKS, MD  hydrOXYzine  (ATARAX ) 10 MG tablet Take 1 tablet (10 mg total) by mouth 3 (three) times daily as needed for anxiety. 03/07/23   Krishnan, Gokul, MD  insulin  aspart (NOVOLOG ) 100 UNIT/ML injection Inject 0-9 Units into the skin 3 (three) times daily with meals. Patient taking differently: Inject 10-30 Units into the skin 3 (three) times daily with meals. 12/03/22   Elgergawy, Brayton RAMAN, MD  insulin  degludec (TRESIBA  FLEXTOUCH) 200 UNIT/ML FlexTouch Pen Inject 15 Units into the skin 2 (two) times daily. If CBG is > 150    [provider]  Insulin  Pen Needle 32G X 4 MM MISC 1 Device by Does not apply route in the morning, at noon, in the evening, and at bedtime. 06/18/22   Shamleffer, Ibtehal Jaralla, MD  MAGNESIUM  PO Take 1 tablet by mouth daily.    [provider]  methocarbamol  (ROBAXIN ) 500 MG tablet Take 500 mg by mouth 3 (three) times daily as needed for muscle spasms. 11/22/22   [provider]  metoCLOPramide  (REGLAN ) 10 MG tablet Take 1 tablet (10 mg total) by mouth every 6 (six) hours. 03/23/23   Mannie Fairy DASEN, DO  Multiple Vitamin (MULTIVITAMIN WITH MINERALS) TABS tablet Take 1 tablet by mouth daily. 11/22/22   Lue Elsie BROCKS, MD  oxyCODONE  (ROXICODONE ) 5 MG immediate release tablet Take 1 tablet (5 mg total) by mouth every 4 (four) hours as needed for severe pain (pain score 7-10). 07/08/23   Hildegard Loge, PA-C  potassium chloride  SA (KLOR-CON  M) 20 MEQ tablet Take 20 mEq by  mouth daily.    [provider]  spironolactone (ALDACTONE) 25 MG tablet Take 25 mg by mouth 2 (two) times daily.    [provider]  traZODone  (DESYREL ) 150 MG tablet Take 150 mg by mouth at bedtime. 01/14/23   [provider]     Family History  Problem Relation Age of Onset   Lung cancer Mother    Diabetes Mother    Bipolar disorder Father    Liver cancer Maternal Grandfather    Diabetes Maternal Aunt        x 2   Pancreatic cancer Maternal Uncle    Prostate cancer Maternal Uncle    Breast cancer Other        maternal great aunt   Heart disease Other    Ovarian cancer Other        maternal great aunt   Kidney disease Other        maternal great aunt   Colon cancer Neg Hx    Stomach cancer Neg Hx     Social History   Socioeconomic History   Marital status: Single    Spouse name: Not on file   Number of children: 0   Years of education: Not on file   Highest education level: Not on file  Occupational History   Occupation: Financial risk analyst  Tobacco Use   Smoking status: Never   Smokeless tobacco: Never  Vaping Use   Vaping status: Never Used  Substance and Sexual Activity   Alcohol use: Yes    Alcohol/week: 1.0 standard drink of alcohol    Types: 1 Shots of liquor per week    Comment: occasional    Drug use: No   Sexual activity: Yes    Birth control/protection: Condom, Implant    Comment: Nexplanon   Other Topics Concern   Not on file  Social History Narrative   Not on file   Social Drivers of Health   Financial Resource Strain: Low Risk  (02/13/2022)   Received from [facility]   Overall Financial Resource Strain (CARDIA)    Difficulty of Paying Living Expenses: Not hard at all  Food Insecurity: Low Risk  (07/09/2023)   Received from [facility]   Hunger Vital Sign    Within the past 12 months, you worried that your food would run out before you got money to buy more: Never true    Within the past 12 months, the food you  bought just didn't last and you didn't have money to get more. : Never true  Transportation Needs: No Transportation Needs (07/09/2023)   Received from [facility]    In the past 12 months, has lack of reliable transportation kept  you from medical appointments, meetings, work or from getting things needed for daily living? : No  Physical Activity: Not on file  Stress: No Stress Concern Present (12/29/2022)   Received from [facility] of Occupational Health - Occupational Stress Questionnaire    Feeling of Stress : Not at all  Social Connections: Moderately Integrated (02/21/2023)   Social Connection and Isolation Panel    Frequency of Communication with Friends and Family: More than three times a week    Frequency of Social Gatherings with Friends and Family: Once a week    Attends Religious Services: More than 4 times per year    Active Member of [employer] or Organizations: Yes    Attends Engineer, structural: More than 4 times per year    Marital Status: Divorced     Review of Systems: A 12 point ROS discussed and pertinent positives are indicated in the HPI above.  All other systems are negative.    Vital Signs: BP (!) 139/93   Pulse 87   Temp 98.1 F (36.7 C) (Oral)   Resp 17   Wt 130 lb (59 kg)   SpO2 97%   BMI 25.39 kg/m     Physical Exam Eyes:     Comments: Vision impaired  Cardiovascular:     Rate and Rhythm: Normal rate and regular rhythm.     Pulses: Normal pulses.     Heart sounds: Normal heart sounds.  Pulmonary:     Effort: Pulmonary effort is normal.     Breath sounds: Normal breath sounds.  Abdominal:     General: There is no distension.     Palpations: Abdomen is soft.     Tenderness: There is no abdominal tenderness.  Musculoskeletal:     Comments: Bilateral AKA  Skin:    General: Skin is warm and dry.     Comments: No rash or wound over planned puncture.   Neurological:     Mental Status: She is  alert and oriented to person, place, and time.  Psychiatric:        Mood and Affect: Mood normal.        Behavior: Behavior normal.        Thought Content: Thought content normal.        Judgment: Judgment normal.     Imaging: No results found.  Labs:  CBC: Recent Labs    04/06/23 0532 04/07/23 0430 07/08/23 1453 10/02/23 0732  WBC 14.5* 13.7* 7.9 10.3  HGB 9.7* 8.4* 11.2* 10.3*  HCT 31.4* 28.9* 37.2 33.7*  PLT 492* 405* 370 398    COAGS: Recent Labs    10/03/22 1354 10/08/22 2000 10/09/22 0208 10/17/22 0601 10/22/22 0511 11/28/22 1403 03/02/23 0444 10/02/23 0732  INR 1.1 2.0*  2.0*   < > 1.6* 1.6* 1.1 1.1 0.8  APTT 23* 38*  --  37*  --   --   --   --    < > = values in this interval not displayed.    BMP: Recent Labs    04/05/23 2007 04/06/23 0532 04/07/23 0430 07/08/23 1453  NA 137 139 134* 135  K 3.5 3.1* 4.5 4.7  CL 108 111 109 108  CO2 19* 20* 19* 18*  GLUCOSE 284* 142* 116* 293*  BUN 19 20 20  36*  CALCIUM  8.1* 7.9* 7.5* 8.5*  CREATININE 1.35* 1.41* 1.56* 2.44*  GFRNONAA 54* 51* 45* 26*    LIVER FUNCTION TESTS: Recent Labs  04/05/23 2007 04/06/23 0532 04/07/23 0430 07/08/23 1453  BILITOT 0.6  0.4 0.5 0.2 0.5  AST 16  16 16 17 18   ALT 17  17 16 13 18   ALKPHOS 100  106 101 84 72  PROT 5.2*  5.2* 5.0* 4.2* 5.1*  ALBUMIN  <1.5*  <1.5* <1.5* <1.5* <1.5*    TUMOR MARKERS: No results for input(s): AFPTM, CEA, CA199, CHROMGRNA in the last 8760 hours.  Assessment and Plan:  Request for  image guided random renal bx approved for 8/15 with Dr. Hughes No contraindications for procedure identified in ROS, physical exam, or review of pre-sedation considerations. 8/15 Labs reviewed and within acceptable range 7/1 US  renal imaging available and reviewed VSS, afebrile Patient does not take a blood thinner  Abx not indicated    Risks and benefits of renal biopsy was discussed with the patient and/or patient's family  including, but not limited to bleeding, infection, damage to adjacent structures or low yield requiring additional tests. She is aware that significant bleeding would potentially require additional procedure and/or hospitalization.   All of the questions were answered and there is agreement to proceed.  Consent signed and in chart.   Thank you for allowing our service to participate in Kelli Hudson 's care.    Electronically Signed: Laymon Coast, NP   10/02/2023, 7:55 AM     I spent a total of  15 Minutes   in face to face in clinical consultation, greater than 50% of which was counseling/coordinating care for random renal bx.    (A copy of this note was sent to the referring provider and the time of visit.)

## 2023-10-02 ENCOUNTER — Ambulatory Visit (HOSPITAL_COMMUNITY)
Admission: RE | Admit: 2023-10-02 | Discharge: 2023-10-02 | Disposition: A | Discharge status: Home / Self Care | Source: Ambulatory Visit | Attending: Nephrology | Admitting: Nephrology

## 2023-10-02 ENCOUNTER — Other Ambulatory Visit: Payer: Self-pay

## 2023-10-02 VITALS — BP 119/88 | HR 87 | Temp 98.1°F | Resp 13 | Wt 130.0 lb

## 2023-10-02 DIAGNOSIS — I129 Hypertensive chronic kidney disease with stage 1 through stage 4 chronic kidney disease, or unspecified chronic kidney disease: Secondary | ICD-10-CM | POA: Insufficient documentation

## 2023-10-02 DIAGNOSIS — N184 Chronic kidney disease, stage 4 (severe): Secondary | ICD-10-CM | POA: Insufficient documentation

## 2023-10-02 DIAGNOSIS — Z89612 Acquired absence of left leg above knee: Secondary | ICD-10-CM | POA: Insufficient documentation

## 2023-10-02 DIAGNOSIS — R809 Proteinuria, unspecified: Secondary | ICD-10-CM | POA: Diagnosis present

## 2023-10-02 DIAGNOSIS — N179 Acute kidney failure, unspecified: Secondary | ICD-10-CM | POA: Diagnosis not present

## 2023-10-02 DIAGNOSIS — E1122 Type 2 diabetes mellitus with diabetic chronic kidney disease: Secondary | ICD-10-CM | POA: Diagnosis not present

## 2023-10-02 DIAGNOSIS — Z794 Long term (current) use of insulin: Secondary | ICD-10-CM | POA: Diagnosis not present

## 2023-10-02 DIAGNOSIS — N189 Chronic kidney disease, unspecified: Secondary | ICD-10-CM

## 2023-10-02 DIAGNOSIS — Z89611 Acquired absence of right leg above knee: Secondary | ICD-10-CM | POA: Insufficient documentation

## 2023-10-02 LAB — CBC
HCT: 33.7 % — ABNORMAL LOW (ref 36.0–46.0)
Hemoglobin: 10.3 g/dL — ABNORMAL LOW (ref 12.0–15.0)
MCH: 29.2 pg (ref 26.0–34.0)
MCHC: 30.6 g/dL (ref 30.0–36.0)
MCV: 95.5 fL (ref 80.0–100.0)
Platelets: 398 K/uL (ref 150–400)
RBC: 3.53 MIL/uL — ABNORMAL LOW (ref 3.87–5.11)
RDW: 13.2 % (ref 11.5–15.5)
WBC: 10.3 K/uL (ref 4.0–10.5)
nRBC: 0 % (ref 0.0–0.2)

## 2023-10-02 LAB — GLUCOSE, CAPILLARY: Glucose-Capillary: 125 mg/dL — ABNORMAL HIGH (ref 70–99)

## 2023-10-02 LAB — PREGNANCY, URINE: Preg Test, Ur: NEGATIVE

## 2023-10-02 LAB — PROTIME-INR
INR: 0.8 (ref 0.8–1.2)
Prothrombin Time: 11.8 s (ref 11.4–15.2)

## 2023-10-02 MED ORDER — LIDOCAINE HCL (PF) 1 % IJ SOLN
10.0000 mL | Freq: Once | INTRAMUSCULAR | Status: AC
  Administered 2023-10-02: 10 mL via INTRADERMAL

## 2023-10-02 MED ORDER — SODIUM CHLORIDE 0.9 % IV SOLN: INTRAVENOUS | Status: DC

## 2023-10-02 MED ORDER — MIDAZOLAM HCL 2 MG/2ML IJ SOLN
INTRAMUSCULAR | Status: AC | PRN
  Administered 2023-10-02: .5 mg via INTRAVENOUS
  Administered 2023-10-02: 1 mg via INTRAVENOUS

## 2023-10-02 MED ORDER — FENTANYL CITRATE (PF) 100 MCG/2ML IJ SOLN
INTRAMUSCULAR | Status: AC
  Filled 2023-10-02: qty 2

## 2023-10-02 MED ORDER — FENTANYL CITRATE (PF) 100 MCG/2ML IJ SOLN
INTRAMUSCULAR | Status: AC | PRN
  Administered 2023-10-02: 25 ug via INTRAVENOUS
  Administered 2023-10-02: 50 ug via INTRAVENOUS

## 2023-10-02 MED ORDER — GELATIN ABSORBABLE 12-7 MM EX MISC
1.0000 | Freq: Once | CUTANEOUS | Status: AC
  Administered 2023-10-02: 1 via TOPICAL

## 2023-10-02 MED ORDER — OXYCODONE HCL 5 MG PO TABS: 10.0000 mg | ORAL_TABLET | ORAL | Status: DC | PRN

## 2023-10-02 MED ORDER — MIDAZOLAM HCL 2 MG/2ML IJ SOLN
INTRAMUSCULAR | Status: AC
  Filled 2023-10-02: qty 2

## 2023-10-02 NOTE — Procedures (Signed)
 Vascular and Interventional Radiology Procedure Note  Patient: Kelli Hudson DOB: [birth year] Medical Record Number: [id number] Note Date/Time: 10/02/23 9:15 AM   Performing Physician: Thom Hall, MD Assistant(s): None  Diagnosis: Proteinuria. CKD   Procedure: RENAL BIOPSY, NON-TARGETED   Anesthesia: Conscious Sedation Complications: None Estimated Blood Loss: Minimal Specimens: Sent for Pathology  Findings:  Successful Ultrasound-guided biopsy of R kidney. A total of 3 samples were obtained. Hemostasis of the tract was achieved using Gelfoam Slurry Embolization.  Plan: Bed rest for 2 hours.  See detailed procedure note with images in PACS. The patient tolerated the procedure well without incident or complication and was returned to Recovery in stable condition.    Thom Hall, MD [facility]   Pager. [phone] Clinic. [phone]

## 2023-10-06 LAB — SURGICAL PATHOLOGY

## 2023-10-23 ENCOUNTER — Other Ambulatory Visit: Payer: Self-pay | Admitting: Student

## 2023-11-02 ENCOUNTER — Other Ambulatory Visit: Payer: Self-pay

## 2023-11-02 ENCOUNTER — Telehealth: Payer: Self-pay | Admitting: Orthopedic Surgery

## 2023-11-02 DIAGNOSIS — Z89611 Acquired absence of right leg above knee: Secondary | ICD-10-CM

## 2023-11-02 NOTE — Telephone Encounter (Signed)
 Patient called. She would like a referral for PT sent to [facility].

## 2023-11-02 NOTE — Telephone Encounter (Signed)
 Order in chart s/p bilat AKA prosthetic gait training.

## 2023-11-12 ENCOUNTER — Ambulatory Visit

## 2023-11-23 ENCOUNTER — Ambulatory Visit: Attending: Orthopedic Surgery

## 2023-11-23 DIAGNOSIS — M6281 Muscle weakness (generalized): Secondary | ICD-10-CM | POA: Insufficient documentation

## 2023-11-23 DIAGNOSIS — Z89611 Acquired absence of right leg above knee: Secondary | ICD-10-CM | POA: Insufficient documentation

## 2023-11-23 DIAGNOSIS — Z89612 Acquired absence of left leg above knee: Secondary | ICD-10-CM | POA: Insufficient documentation

## 2023-11-23 DIAGNOSIS — R29898 Other symptoms and signs involving the musculoskeletal system: Secondary | ICD-10-CM | POA: Diagnosis present

## 2023-11-23 DIAGNOSIS — R293 Abnormal posture: Secondary | ICD-10-CM | POA: Insufficient documentation

## 2023-11-23 DIAGNOSIS — R262 Difficulty in walking, not elsewhere classified: Secondary | ICD-10-CM | POA: Diagnosis present

## 2023-11-23 DIAGNOSIS — R2689 Other abnormalities of gait and mobility: Secondary | ICD-10-CM | POA: Insufficient documentation

## 2023-11-23 NOTE — Therapy (Unsigned)
 OUTPATIENT PHYSICAL THERAPY NEURO EVALUATION   Patient Name: Kelli Hudson MRN: [id number] DOB:November 12, 1990, 33 y.o., female Today's Date: 11/25/2023   PCP: Daphne Lesches, NP REFERRING PROVIDER: Jerona Sage, MD  END OF SESSION:   11/23/23 1533  PT Visits / Re-Eval  Visit Number 1  Number of Visits 25  Date for Recertification  02/19/24  Authorization  Authorization Type [employer] dual  Progress Note Due on Visit 10  PT Time Calculation  PT Start Time 1532  PT Stop Time 1620  PT Time Calculation (min) 48 min  PT - End of Session  Equipment Utilized During Treatment Gait belt  Activity Tolerance Patient tolerated treatment well  Behavior During Therapy WFL for tasks assessed/performed     Past Medical History:  Diagnosis Date   Acute H. pylori gastric ulcer    Coffee ground emesis    Diabetes mellitus (HCC)    Diabetic gastroparesis (HCC)    DKA (diabetic ketoacidosis) (HCC) 02/24/2021   Gastroparesis    GERD (gastroesophageal reflux disease)    Hypertension    Hyperthyroidism    Intractable nausea and vomiting 04/20/2021   Normocytic anemia 04/20/2020   Prolonged Q-T interval on ECG    Past Surgical History:  Procedure Laterality Date   AMPUTATION Bilateral 10/24/2022   Procedure: BILATERAL BELOW KNEE AMPUTATION;  Surgeon: Sage Jerona GAILS, MD;  Location: [facility] OR;  Service: Orthopedics;  Laterality: Bilateral;   AMPUTATION Bilateral 10/08/2022   Procedure: BILATERAL LEG DEBRIDEMENT;  Surgeon: Sage Jerona GAILS, MD;  Location: [facility] OR;  Service: Orthopedics;  Laterality: Bilateral;   AMPUTATION Bilateral 11/14/2022   Procedure: BILATERAL ABOVE KNEE AMPUTATION;  Surgeon: Sage Jerona GAILS, MD;  Location: [facility] OR;  Service: Orthopedics;  Laterality: Bilateral;   AMPUTATION TOE Left 03/10/2018   Procedure: AMPUTATION FIFTH TOE;  Surgeon: Gershon Donnice SAUNDERS, DPM;  Location:  SURGERY CENTER;  Service: Podiatry;  Laterality: Left;   APPLICATION OF WOUND VAC Bilateral  10/12/2022   Procedure: APPLICATION OF WOUND VAC BILATERAL LEGS;  Surgeon: Celena Sharper, MD;  Location: [facility] OR;  Service: Orthopedics;  Laterality: Bilateral;   APPLICATION OF WOUND VAC Bilateral 10/24/2022   Procedure: APPLICATION OF WOUND VAC;  Surgeon: Sage Jerona GAILS, MD;  Location: [facility] OR;  Service: Orthopedics;  Laterality: Bilateral;   BIOPSY  01/28/2020   Procedure: BIOPSY;  Surgeon: Elicia Claw, MD;  Location: [facility];  Service: Gastroenterology;;   BOTOX  INJECTION  08/26/2021   Procedure: BOTOX  INJECTION;  Surgeon: Legrand Victory LITTIE DOUGLAS, MD;  Location: [facility];  Service: Gastroenterology;;   ESOPHAGOGASTRODUODENOSCOPY N/A 01/28/2020   Procedure: ESOPHAGOGASTRODUODENOSCOPY (EGD);  Surgeon: Elicia Claw, MD;  Location: [facility];  Service: Gastroenterology;  Laterality: N/A;   ESOPHAGOGASTRODUODENOSCOPY (EGD) WITH PROPOFOL  Left 09/08/2015   Procedure: ESOPHAGOGASTRODUODENOSCOPY (EGD) WITH PROPOFOL ;  Surgeon: Elsie Cree, MD;  Location: [facility] END[facility]COPY;  Service: Endoscopy;  Laterality: Left;   ESOPHAGOGASTRODUODENOSCOPY (EGD) WITH PROPOFOL  N/A 08/26/2021   Procedure: ESOPHAGOGASTRODUODENOSCOPY (EGD) WITH PROPOFOL ;  Surgeon: Legrand Victory LITTIE DOUGLAS, MD;  Location: [facility];  Service: Gastroenterology;  Laterality: N/A;   GASTRIC STIMULATOR IMPLANT SURGERY  06/2022   at [facility]   I & D EXTREMITY Bilateral 10/12/2022   Procedure: IRRIGATION AND  DEBRIDEMENT  BILATERAL LEGS;  Surgeon: Celena Sharper, MD;  Location: [facility] OR;  Service: Orthopedics;  Laterality: Bilateral;   I & D EXTREMITY Right 10/13/2022   Procedure: RIGHT THIGH WOUND VAC EXCHANGE;  Surgeon: Kendal Franky SQUIBB, MD;  Location: [facility] OR;  Service: Orthopedics;  Laterality:  Right;   I & D EXTREMITY Bilateral 10/17/2022   Procedure: BILATERAL THIGH AND LEG DEBRIDEMENT, PARTIAL BILATERAL FOOT AMPUTATION;  Surgeon: Harden Jerona GAILS, MD;  Location: [facility] OR;  Service: Orthopedics;  Laterality: Bilateral;   I & D EXTREMITY Bilateral  11/05/2022   Procedure: BILATERAL THIGH DEBRIDEMENT;  Surgeon: Harden Jerona GAILS, MD;  Location: [facility] OR;  Service: Orthopedics;  Laterality: Bilateral;   I & D EXTREMITY Bilateral 11/07/2022   Procedure: BILATERAL THIGH AND LEG DEBRIDEMENT;  Surgeon: Harden Jerona GAILS, MD;  Location: [facility] OR;  Service: Orthopedics;  Laterality: Bilateral;   I & D EXTREMITY Bilateral 11/12/2022   Procedure: BILATERAL LEG DEBRIDEMENTS;  Surgeon: Harden Jerona GAILS, MD;  Location: [facility] OR;  Service: Orthopedics;  Laterality: Bilateral;   IRRIGATION AND DEBRIDEMENT FOOT Bilateral 10/05/2022   Procedure: IRRIGATION AND DEBRIDEMENT FOOT;  Surgeon: Janit Thresa HERO, DPM;  Location: [facility] OR[facility];  Service: Orthopedics/Podiatry;  Laterality: Bilateral;   WISDOM TOOTH EXTRACTION     Patient Active Problem List   Diagnosis Date Noted   Influenza A 04/05/2023   Elevated lipase 04/05/2023   Esophagitis 03/24/2023   SIRS (systemic inflammatory response syndrome) (HCC) 03/02/2023   Fever 03/02/2023   Metabolic acidosis 03/02/2023   Hypertensive urgency 02/21/2023   Chronic pain 02/21/2023   Abscess of thigh 11/28/2022   C. 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   Hypokalemia 11/27/2022   Effusion, left knee 11/05/2022   MDD (major depressive disorder), recurrent episode, moderate (HCC) 10/16/2022   Necrotizing fasciitis of pelvic region and thigh (HCC) 10/08/2022   Necrotizing fasciitis of lower leg (HCC) 10/08/2022   Necrotizing fasciitis (HCC) 10/08/2022   Sepsis due to cellulitis (HCC) 10/03/2022   Cellulitis of lower extremity 10/03/2022   Multinodular goiter 06/18/2022   Malnutrition of moderate degree 04/18/2022   Intractable vomiting with nausea 04/17/2022   DKA (diabetic ketoacidosis) (HCC) 03/04/2022   Nausea vomiting and diarrhea 12/25/2021   Diabetic gastroparesis (HCC) 10/09/2021   Drug-seeking behavior 09/21/2021   Essential hypertension 08/25/2021   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) 08/25/2021   GERD without esophagitis 08/25/2021   Hematemesis    Prolonged Q-T interval on ECG    Nausea and vomiting 07/20/2021   Anemia of chronic disease 06/06/2021   Dehydration 06/04/2021   Hypomagnesemia 04/21/2021   Ulcerated, foot, left, with fat layer exposed (HCC) 04/20/2021   Uncontrolled type 1 diabetes mellitus with hyperglycemia, with long-term current use of insulin  (HCC) 12/18/2020   DM type 1 (diabetes mellitus, type 1) (HCC) 12/18/2020   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 11/09/2020   Diabetic retinopathy of both eyes associated with type 1 diabetes mellitus (HCC) 09/24/2020   Diabetic gastroparesis associated with type 1 diabetes mellitus (HCC) 08/12/2020   Acute kidney injury superimposed on chronic kidney disease 07/25/2020   Generalized abdominal pain 07/23/2020   GERD (gastroesophageal reflux disease) 07/23/2020   Diabetic nephropathy associated with type 1 diabetes mellitus (HCC) 06/27/2020   Sepsis (HCC) 05/27/2020   HLD (hyperlipidemia) 05/23/2020   Sinus tachycardia 05/09/2020   Anemia 04/20/2020   Depression with anxiety 07/05/2019   Diabetic polyneuropathy associated with type 1 diabetes mellitus (HCC) 09/24/2017   Gastroparesis    Goiter 10/05/2009   DM2 (diabetes mellitus, type 2) (HCC) 10/05/2009   Hypertension associated with diabetes (HCC) 10/05/2009    ONSET DATE: 11/02/23 referral   REFERRING DIAG: S10.388,S10.387 (ICD-10-CM) - S/P AKA (above knee  amputation) bilateral (HCC)   THERAPY DIAG:  Muscle weakness (generalized) - Plan: PT plan of care cert/re-cert  Other abnormalities of gait and mobility - Plan: PT plan of care cert/re-cert  Abnormal posture - Plan: PT plan of care cert/re-cert  Difficulty in walking, not elsewhere classified - Plan: PT plan of care cert/re-cert  Rationale for Evaluation and Treatment: Rehabilitation  SUBJECTIVE:                                                                                                                                                                                              SUBJECTIVE STATEMENT: Patient arrives to clinic with SO in K5 wc. She is familiar to this clinic. She received her new prosthetics 9/19 and only stood and walked while at [facility]. Denies falls.  Pt accompanied by: significant other  PERTINENT HISTORY: DM, diabetic gastroparesis, GERD, HTN, hyperthyroidism, normocytic anemia, B AKA  PAIN:  Are you having pain? No  PRECAUTIONS: Fall    WEIGHT BEARING RESTRICTIONS: No  FALLS: Has patient fallen in last 6 months? No  LIVING ENVIRONMENT: Lives with: lives alone Lives in: House/apartment Stairs: No Has following equipment at home: Vannie - 2 wheeled, Wheelchair (manual), shower chair, bed side commode, and Grab bars  PLOF: Independent  PATIENT GOALS: to learn how to walk with new prosthetics  OBJECTIVE:  Note: Objective measures were completed at Evaluation unless otherwise noted.  DIAGNOSTIC FINDINGS: n/a  COGNITION: Overall cognitive status: Within functional limits for tasks assessed   SENSATION: WFL  LOWER EXTREMITY ROM:     Active  Right Eval Left Eval  Hip flexion    Hip extension    Hip abduction    Hip adduction    Hip internal rotation    Hip external rotation    Knee flexion    Knee extension    Ankle dorsiflexion    Ankle plantarflexion    Ankle inversion    Ankle eversion     (Blank rows = not tested)  LOWER EXTREMITY MMT:    MMT Right Eval Left Eval  Hip flexion [facility]  Hip extension [facility]  Hip abduction    Hip adduction    Hip internal rotation    Hip external rotation    Knee flexion    Knee extension    Ankle dorsiflexion    Ankle plantarflexion    Ankle inversion    Ankle eversion    (Blank rows = not tested)    TRANSFERS: A/P transfers wc <> mat Ind  GAIT: TBA  TREATMENT  Prosthetic training:  -intro to MPK -sit <> stand in // bars with gross MaxA due to settings  -unable to attempt gait due to decreased B knee stability    PATIENT EDUCATION: Education details: PT POC, exam findings, see above Person educated: Patient and SO Education method: Medical illustrator Education comprehension: verbalized understanding and needs further education  HOME EXERCISE PROGRAM: -practice donning/doffing prosthetics as much as possible   GOALS: Goals reviewed with patient? Yes  SHORT TERM GOALS: Target date: 12/25/23  Pt will be independent with initial HEP for improved functional mobility and independence with new prosthetics  Baseline: to be provided Goal status: INITIAL  2.  Patient will complete sit <> stand with LRAD and no more than MinA consistently  Baseline: up to MaxA in // bars Goal status: INITIAL  3.  Gait goal  Baseline: to be assessed  Goal status: INITIAL  4.  Patient will be able to don/doff B prosthetics with no more than MinA  Baseline: MaxA  Goal status: INITIAL  LONG TERM GOALS: Target date: 02/19/24  Pt will be independent with final HEP for improved functional mobility and independence with new prosthetics  Baseline: to be provided Goal status: INITIAL  2.  Patient will complete sit <> stand with LRAD with no more than supervision consistently  Baseline: MaxA in // bars  Goal status: INITIAL  3.  Gait goal Baseline: TBA Goal status: INITIAL  4.  Patient will traverse at least 1 community obstacle (curb vs ramp) with LRAD and no more than CGA Baseline: unable on eval  Goal status: INITIAL  5.  Patient will don/doff B prosthetics independently  Baseline: MaxA Goal status: INITIAL   ASSESSMENT:  CLINICAL IMPRESSION: Patient is a 33 y.o. female who was seen today for physical therapy evaluation and treatment for gait training with new MPK  prosthetics. She is familiar to this clinic. She received her new prosthetics last month and stood/ambulated with them while in the prosthetics clinic, but not since. Given current knee resistance, she requires grossly MaxA and use of the // bars to come to stand. Her current wc height contributes to the difficulty as well as it's currently set at nearly hemi-height. With increasing independence with B prosthetics, she may require her wc to be heightened slightly for greater ease of transfers. Unable to progress to gait in // bars due to instability of prosthetic knee joints. Time will need to be spent adjusting this within parameters allowed on patients app. She requires skilled PT services to address the above mentioned deficits.   OBJECTIVE IMPAIRMENTS: Abnormal gait, cardiopulmonary status limiting activity, decreased activity tolerance, decreased balance, decreased endurance, decreased knowledge of use of DME, decreased mobility, difficulty walking, decreased strength, impaired vision/preception, and prosthetic dependency .   ACTIVITY LIMITATIONS: carrying, lifting, bending, standing, squatting, stairs, locomotion level, and caring for others  PARTICIPATION LIMITATIONS: meal prep, cleaning, interpersonal relationship, driving, shopping, community activity, and occupation  PERSONAL FACTORS: Age, Behavior pattern, Fitness, Past/current experiences, Social background, Time since onset of injury/illness/exacerbation, Transportation, and 3+ comorbidities: see above are also affecting patient's functional outcome.   REHAB POTENTIAL: Fair complexity of comorbidities   CLINICAL DECISION MAKING: Evolving/moderate complexity  EVALUATION COMPLEXITY: Moderate  PLAN:  PT FREQUENCY: 2x/week  PT DURATION: 12 weeks  PLANNED INTERVENTIONS: [id number]- PT Re-evaluation, 97750- Physical Performance Testing, 97110-Therapeutic exercises, 97530- Therapeutic activity, [id number]- Neuromuscular re-education, 97535- Self  Care, [id number]- Manual therapy, [id number]- Gait training, [id number]- Orthotic Initial, [id number]- Prosthetic Initial , [phone]-  Orthotic/Prosthetic subsequent, [id number]- Canalith repositioning, [id number]- Aquatic Therapy, [phone] (1-2 muscles), 20561 (3+ muscles)- Dry Needling, Patient/Family education, Balance training, Stair training, Vestibular training, Visual/preceptual remediation/compensation, DME instructions, Wheelchair mobility training, Cryotherapy, and Moist heat  PLAN FOR NEXT SESSION: assess gait + goal when able, sink HEP?, adjust knee resistance in app PRN    [name], PT [name], PT, DPT, CBIS  11/25/2023, 7:43 AM

## 2023-11-24 ENCOUNTER — Encounter (HOSPITAL_COMMUNITY): Payer: Self-pay

## 2023-11-30 ENCOUNTER — Telehealth: Payer: Self-pay | Admitting: Orthopedic Surgery

## 2023-11-30 NOTE — Telephone Encounter (Signed)
 Pt called stating that she is wanting smaller shrinkers. The ones she has are too large pt call back number is [phone]

## 2023-12-03 ENCOUNTER — Ambulatory Visit

## 2023-12-03 DIAGNOSIS — R262 Difficulty in walking, not elsewhere classified: Secondary | ICD-10-CM

## 2023-12-03 DIAGNOSIS — M6281 Muscle weakness (generalized): Secondary | ICD-10-CM | POA: Diagnosis not present

## 2023-12-03 DIAGNOSIS — R2689 Other abnormalities of gait and mobility: Secondary | ICD-10-CM

## 2023-12-03 DIAGNOSIS — R293 Abnormal posture: Secondary | ICD-10-CM

## 2023-12-03 NOTE — Therapy (Signed)
 OUTPATIENT PHYSICAL THERAPY NEURO TREATMENT   Patient Name: Kelli Hudson MRN: [id number] DOB:March 26, 1990, 33 y.o., female Today's Date: 12/03/2023   PCP: Daphne Lesches, NP REFERRING PROVIDER: Jerona Sage, MD  END OF SESSION:  PT End of Session - 12/03/23 1253     Visit Number 2    Number of Visits 25    Date for Recertification  02/19/24    Authorization Type [employer] dual    Progress Note Due on Visit 10    PT Start Time 1150   patient late   PT Stop Time 1235    PT Time Calculation (min) 45 min    Equipment Utilized During Treatment Gait belt    Activity Tolerance Patient tolerated treatment well    Behavior During Therapy WFL for tasks assessed/performed          Past Medical History:  Diagnosis Date   Acute H. pylori gastric ulcer    Coffee ground emesis    Diabetes mellitus (HCC)    Diabetic gastroparesis (HCC)    DKA (diabetic ketoacidosis) (HCC) 02/24/2021   Gastroparesis    GERD (gastroesophageal reflux disease)    Hypertension    Hyperthyroidism    Intractable nausea and vomiting 04/20/2021   Normocytic anemia 04/20/2020   Prolonged Q-T interval on ECG    Past Surgical History:  Procedure Laterality Date   AMPUTATION Bilateral 10/24/2022   Procedure: BILATERAL BELOW KNEE AMPUTATION;  Surgeon: Sage Jerona GAILS, MD;  Location: [facility] OR;  Service: Orthopedics;  Laterality: Bilateral;   AMPUTATION Bilateral 10/08/2022   Procedure: BILATERAL LEG DEBRIDEMENT;  Surgeon: Sage Jerona GAILS, MD;  Location: [facility] OR;  Service: Orthopedics;  Laterality: Bilateral;   AMPUTATION Bilateral 11/14/2022   Procedure: BILATERAL ABOVE KNEE AMPUTATION;  Surgeon: Sage Jerona GAILS, MD;  Location: [facility] OR;  Service: Orthopedics;  Laterality: Bilateral;   AMPUTATION TOE Left 03/10/2018   Procedure: AMPUTATION FIFTH TOE;  Surgeon: Gershon Donnice SAUNDERS, DPM;  Location:  SUR[facility]ERY CENTER;  Service: Podiatry;  Laterality: Left;   APPLICATION OF WOUND VAC Bilateral 10/12/2022    Procedure: APPLICATION OF WOUND VAC BILATERAL LEGS;  Surgeon: Celena Sharper, MD;  Location: [facility] OR;  Service: Orthopedics;  Laterality: Bilateral;   APPLICATION OF WOUND VAC Bilateral 10/24/2022   Procedure: APPLICATION OF WOUND VAC;  Surgeon: Sage Jerona GAILS, MD;  Location: [facility] OR;  Service: Orthopedics;  Laterality: Bilateral;   BIOPSY  01/28/2020   Procedure: BIOPSY;  Surgeon: Elicia Claw, MD;  Location: [facility];  Service: Gastroenterology;;   BOTOX  INJECTION  08/26/2021   Procedure: BOTOX  INJECTION;  Surgeon: Legrand Victory LITTIE DOUGLAS, MD;  Location: [facility];  Service: Gastroenterology;;   ESOPHAGOGASTRODUODENOSCOPY N/A 01/28/2020   Procedure: ESOPHAGOGASTRODUODENOSCOPY (EGD);  Surgeon: Elicia Claw, MD;  Location: [facility];  Service: Gastroenterology;  Laterality: N/A;   ESOPHAGOGASTRODUODENOSCOPY (EGD) WITH PROPOFOL  Left 09/08/2015   Procedure: ESOPHAGOGASTRODUODENOSCOPY (EGD) WITH PROPOFOL ;  Surgeon: Elsie Cree, MD;  Location: [facility] ENDOSCOPY;  Service: Endoscopy;  Laterality: Left;   ESOPHAGOGASTRODUODENOSCOPY (EGD) WITH PROPOFOL  N/A 08/26/2021   Procedure: ESOPHAGOGASTRODUODENOSCOPY (EGD) WITH PROPOFOL ;  Surgeon: Legrand Victory LITTIE DOUGLAS, MD;  Location: [facility];  Service: Gastroenterology;  Laterality: N/A;   GASTRIC STIMULATOR IMPLANT SURGERY  06/2022   at [facility]   I & D EXTREMITY Bilateral 10/12/2022   Procedure: IRRIGATION AND  DEBRIDEMENT  BILATERAL LEGS;  Surgeon: Celena Sharper, MD;  Location: [facility] OR;  Service: Orthopedics;  Laterality: Bilateral;   I & D EXTREMITY Right 10/13/2022   Procedure: RIGHT THIGH  WOUND VAC EXCHANGE;  Surgeon: Kendal Franky SQUIBB, MD;  Location: [facility] OR;  Service: Orthopedics;  Laterality: Right;   I & D EXTREMITY Bilateral 10/17/2022   Procedure: BILATERAL THIGH AND LEG DEBRIDEMENT, PARTIAL BILATERAL FOOT AMPUTATION;  Surgeon: Harden Jerona GAILS, MD;  Location: [facility] OR;  Service: Orthopedics;  Laterality: Bilateral;   I & D EXTREMITY Bilateral 11/05/2022    Procedure: BILATERAL THIGH DEBRIDEMENT;  Surgeon: Harden Jerona GAILS, MD;  Location: [facility] OR;  Service: Orthopedics;  Laterality: Bilateral;   I & D EXTREMITY Bilateral 11/07/2022   Procedure: BILATERAL THIGH AND LEG DEBRIDEMENT;  Surgeon: Harden Jerona GAILS, MD;  Location: [facility] OR;  Service: Orthopedics;  Laterality: Bilateral;   I & D EXTREMITY Bilateral 11/12/2022   Procedure: BILATERAL LEG DEBRIDEMENTS;  Surgeon: Harden Jerona GAILS, MD;  Location: [facility] OR;  Service: Orthopedics;  Laterality: Bilateral;   IRRIGATION AND DEBRIDEMENT FOOT Bilateral 10/05/2022   Procedure: IRRIGATION AND DEBRIDEMENT FOOT;  Surgeon: Janit Thresa HERO, DPM;  Location: [facility] ORS;  Service: Orthopedics/Podiatry;  Laterality: Bilateral;   WISDOM TOOTH EXTRACTION     Patient Active Problem List   Diagnosis Date Noted   Influenza A 04/05/2023   Elevated lipase 04/05/2023   Esophagitis 03/24/2023   SIRS (systemic inflammatory response syndrome) (HCC) 03/02/2023   Fever 03/02/2023   Metabolic acidosis 03/02/2023   Hypertensive urgency 02/21/2023   Chronic pain 02/21/2023   Abscess of thigh 11/28/2022   C. 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   Hypokalemia 11/27/2022   Effusion, left knee 11/05/2022   MDD (major depressive disorder), recurrent episode, moderate (HCC) 10/16/2022   Necrotizing fasciitis of pelvic region and thigh (HCC) 10/08/2022   Necrotizing fasciitis of lower leg (HCC) 10/08/2022   Necrotizing fasciitis (HCC) 10/08/2022   Sepsis due to cellulitis (HCC) 10/03/2022   Cellulitis of lower extremity 10/03/2022   Multinodular goiter 06/18/2022   Malnutrition of moderate degree 04/18/2022   Intractable vomiting with nausea 04/17/2022   DKA (diabetic ketoacidosis) (HCC) 03/04/2022   Nausea vomiting and diarrhea 12/25/2021   Diabetic gastroparesis (HCC) 10/09/2021   Drug-seeking behavior 09/21/2021   Essential hypertension 08/25/2021   Diabetes mellitus type 1 with  complications (HCC) 08/25/2021   GERD without esophagitis 08/25/2021   Hematemesis    Prolonged Q-T interval on ECG    Nausea and vomiting 07/20/2021   Anemia of chronic disease 06/06/2021   Dehydration 06/04/2021   Hypomagnesemia 04/21/2021   Ulcerated, foot, left, with fat layer exposed (HCC) 04/20/2021   Uncontrolled type 1 diabetes mellitus with hyperglycemia, with long-term current use of insulin  (HCC) 12/18/2020   DM type 1 (diabetes mellitus, type 1) (HCC) 12/18/2020   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 11/09/2020   Diabetic retinopathy of both eyes associated with type 1 diabetes mellitus (HCC) 09/24/2020   Diabetic gastroparesis associated with type 1 diabetes mellitus (HCC) 08/12/2020   Acute kidney injury superimposed on chronic kidney disease 07/25/2020   Generalized abdominal pain 07/23/2020   GERD (gastroesophageal reflux disease) 07/23/2020   Diabetic nephropathy associated with type 1 diabetes mellitus (HCC) 06/27/2020   Sepsis (HCC) 05/27/2020   HLD (hyperlipidemia) 05/23/2020   Sinus tachycardia 05/09/2020   Anemia 04/20/2020   Depression with anxiety 07/05/2019   Diabetic polyneuropathy associated with type 1 diabetes mellitus (HCC) 09/24/2017   Gastroparesis    Goiter 10/05/2009   DM2 (diabetes mellitus, type 2) (HCC) 10/05/2009   Hypertension associated with diabetes (HCC) 10/05/2009  ONSET DATE: 11/02/23 referral   REFERRING DIAG: [id number] (ICD-10-CM) - S/P AKA (above knee amputation) bilateral (HCC)   THERAPY DIAG:  Muscle weakness (generalized)  Other abnormalities of gait and mobility  Abnormal posture  Difficulty in walking, not elsewhere classified  Rationale for Evaluation and Treatment: Rehabilitation  SUBJECTIVE:                                                                                                                                                                                              SUBJECTIVE STATEMENT: Patient arrives to clinic with SO, donning prosthetics in lobby. Denies falls. Did practice donning/doffing prosthetics, requests modification to tape to assist with low vision application.  Pt accompanied by: significant other  PERTINENT HISTORY: DM, diabetic gastroparesis, GERD, HTN, hyperthyroidism, normocytic anemia, B AKA  PAIN:  Are you having pain? No  PRECAUTIONS: Fall   PATIENT GOALS: to learn how to walk with new prosthetics                                                                                                                             TREATMENT  Theract:  -time spent applying thin layer of medical tape to inside of socket to provide for tactile guidance of where to thread lanyard   Gait: -sit <> stand in // bars with bars set at walker-width   -MinA -unable to activate appropriate B knee flexion with weight shift into forefoot on either leg despite multiple adjustments to knee resistance  -gait 2x11ft in // with MInA + wc follow for safety  -pre gait: anterior/posterior rock in attempt to get knee to flex, but unable to    PATIENT EDUCATION: Education details: continue HEP Person educated: Patient and SO Education method: Medical illustrator Education comprehension: verbalized understanding and needs further education  HOME EXERCISE PROGRAM: -practice donning/doffing prosthetics as much as possible   GOALS: Goals reviewed with patient? Yes  SHORT TERM GOALS: Target date: 12/25/23  Pt will be independent with initial HEP for improved functional mobility and independence with new prosthetics  Baseline: to be  provided Goal status: INITIAL  2.  Patient will complete sit <> stand with LRAD and no more than MinA consistently  Baseline: up to MaxA in // bars Goal status: INITIAL  3.  Gait goal  Baseline: to be assessed  Goal status: INITIAL  4.  Patient will be able to don/doff B prosthetics with no more than MinA   Baseline: MaxA  Goal status: INITIAL  LONG TERM GOALS: Target date: 02/19/24  Pt will be independent with final HEP for improved functional mobility and independence with new prosthetics  Baseline: to be provided Goal status: INITIAL  2.  Patient will complete sit <> stand with LRAD with no more than supervision consistently  Baseline: MaxA in // bars  Goal status: INITIAL  3.  Gait goal Baseline: TBA Goal status: INITIAL  4.  Patient will traverse at least 1 community obstacle (curb vs ramp) with LRAD and no more than CGA Baseline: unable on eval  Goal status: INITIAL  5.  Patient will don/doff B prosthetics independently  Baseline: MaxA Goal status: INITIAL   ASSESSMENT:  CLINICAL IMPRESSION: Patient seen for skilled PT session with emphasis on gait training in prosthetics. Addition of textured tape seemed to assist patient with ability to don/doff prosthetics. PT unable to get B knees to trigger into flexion despite multiple adjustments to knee resistance. PT questioning if B MPKs were turned on by prosthetist. Continue POC.   OBJECTIVE IMPAIRMENTS: Abnormal gait, cardiopulmonary status limiting activity, decreased activity tolerance, decreased balance, decreased endurance, decreased knowledge of use of DME, decreased mobility, difficulty walking, decreased strength, impaired vision/preception, and prosthetic dependency .   ACTIVITY LIMITATIONS: carrying, lifting, bending, standing, squatting, stairs, locomotion level, and caring for others  PARTICIPATION LIMITATIONS: meal prep, cleaning, interpersonal relationship, driving, shopping, community activity, and occupation  PERSONAL FACTORS: Age, Behavior pattern, Fitness, Past/current experiences, Social background, Time since onset of injury/illness/exacerbation, Transportation, and 3+ comorbidities: see above are also affecting patient's functional outcome.   REHAB POTENTIAL: Fair complexity of comorbidities   CLINICAL  DECISION MAKING: Evolving/moderate complexity  EVALUATION COMPLEXITY: Moderate  PLAN:  PT FREQUENCY: 2x/week  PT DURATION: 12 weeks  PLANNED INTERVENTIONS: [id number]- PT Re-evaluation, 97750- Physical Performance Testing, 97110-Therapeutic exercises, 97530- Therapeutic activity, [id number]- Neuromuscular re-education, 97535- Self Care, [id number]- Manual therapy, [id number]- Gait training, [phone]- Orthotic Initial, [phone]- Prosthetic Initial , [phone]- Orthotic/Prosthetic subsequent, [phone]- Canalith repositioning, [id number]- Aquatic Therapy, [phone] (1-2 muscles), [id number] (3+ muscles)- Dry Needling, Patient/Family education, Balance training, Stair training, Vestibular training, Visual/preceptual remediation/compensation, DME instructions, Wheelchair mobility training, Cryotherapy, and Moist heat  PLAN FOR NEXT SESSION: assess gait + goal when able, sink HEP?, adjust knee resistance in app PRN    [name], PT [name], PT, DPT, CBIS  12/03/2023, 1:28 PM

## 2023-12-07 ENCOUNTER — Encounter: Payer: Self-pay | Admitting: Physical Therapy

## 2023-12-07 ENCOUNTER — Ambulatory Visit: Admitting: Physical Therapy

## 2023-12-07 DIAGNOSIS — R262 Difficulty in walking, not elsewhere classified: Secondary | ICD-10-CM

## 2023-12-07 DIAGNOSIS — R29898 Other symptoms and signs involving the musculoskeletal system: Secondary | ICD-10-CM

## 2023-12-07 DIAGNOSIS — M6281 Muscle weakness (generalized): Secondary | ICD-10-CM

## 2023-12-07 DIAGNOSIS — R2689 Other abnormalities of gait and mobility: Secondary | ICD-10-CM

## 2023-12-07 DIAGNOSIS — R293 Abnormal posture: Secondary | ICD-10-CM

## 2023-12-07 NOTE — Therapy (Unsigned)
 OUTPATIENT PHYSICAL THERAPY NEURO TREATMENT   Patient Name: Kelli Hudson MRN: [id number] DOB:August 06, 1990, 33 y.o., female Today's Date: 12/07/2023   PCP: Daphne Lesches, NP REFERRING PROVIDER: Jerona Sage, MD  END OF SESSION:  PT End of Session - 12/07/23 1321     Visit Number 3    Number of Visits 25    Date for Recertification  02/19/24    Authorization Type [employer] dual    Progress Note Due on Visit 10    PT Start Time 1317    PT Stop Time 1404    PT Time Calculation (min) 47 min    Equipment Utilized During Treatment Gait belt    Activity Tolerance Patient tolerated treatment well    Behavior During Therapy WFL for tasks assessed/performed          Past Medical History:  Diagnosis Date   Acute H. pylori gastric ulcer    Coffee ground emesis    Diabetes mellitus (HCC)    Diabetic gastroparesis (HCC)    DKA (diabetic ketoacidosis) (HCC) 02/24/2021   Gastroparesis    GERD (gastroesophageal reflux disease)    Hypertension    Hyperthyroidism    Intractable nausea and vomiting 04/20/2021   Normocytic anemia 04/20/2020   Prolonged Q-T interval on ECG    Past Surgical History:  Procedure Laterality Date   AMPUTATION Bilateral 10/24/2022   Procedure: BILATERAL BELOW KNEE AMPUTATION;  Surgeon: Sage Jerona GAILS, MD;  Location: [facility] OR;  Service: Orthopedics;  Laterality: Bilateral;   AMPUTATION Bilateral 10/08/2022   Procedure: BILATERAL LEG DEBRIDEMENT;  Surgeon: Sage Jerona GAILS, MD;  Location: [facility] OR;  Service: Orthopedics;  Laterality: Bilateral;   AMPUTATION Bilateral 11/14/2022   Procedure: BILATERAL ABOVE KNEE AMPUTATION;  Surgeon: Sage Jerona GAILS, MD;  Location: [facility] OR;  Service: Orthopedics;  Laterality: Bilateral;   AMPUTATION TOE Left 03/10/2018   Procedure: AMPUTATION FIFTH TOE;  Surgeon: Gershon Donnice SAUNDERS, DPM;  Location: [facility];  Service: Podiatry;  Laterality: Left;   APPLICATION OF WOUND VAC Bilateral 10/12/2022   Procedure:  APPLICATION OF WOUND VAC BILATERAL LEGS;  Surgeon: Celena Sharper, MD;  Location: [facility] OR;  Service: Orthopedics;  Laterality: Bilateral;   APPLICATION OF WOUND VAC Bilateral 10/24/2022   Procedure: APPLICATION OF WOUND VAC;  Surgeon: Sage Jerona GAILS, MD;  Location: [facility] OR;  Service: Orthopedics;  Laterality: Bilateral;   BIOPSY  01/28/2020   Procedure: BIOPSY;  Surgeon: Elicia Claw, MD;  Location: [facility];  Service: Gastroenterology;;   BOTOX  INJECTION  08/26/2021   Procedure: BOTOX  INJECTION;  Surgeon: Legrand Victory LITTIE DOUGLAS, MD;  Location: [facility];  Service: Gastroenterology;;   ESOPHAGOGASTRODUODENOSCOPY N/A 01/28/2020   Procedure: ESOPHAGOGASTRODUODENOSCOPY (EGD);  Surgeon: Elicia Claw, MD;  Location: [facility];  Service: Gastroenterology;  Laterality: N/A;   ESOPHAGOGASTRODUODENOSCOPY (EGD) WITH PROPOFOL  Left 09/08/2015   Procedure: ESOPHAGOGASTRODUODENOSCOPY (EGD) WITH PROPOFOL ;  Surgeon: Elsie Cree, MD;  Location: [facility] ENDOSCOPY;  Service: Endoscopy;  Laterality: Left;   ESOPHAGOGASTRODUODENOSCOPY (EGD) WITH PROPOFOL  N/A 08/26/2021   Procedure: ESOPHAGOGASTRODUODENOSCOPY (EGD) WITH PROPOFOL ;  Surgeon: Legrand Victory LITTIE DOUGLAS, MD;  Location: [facility];  Service: Gastroenterology;  Laterality: N/A;   GASTRIC STIMULATOR IMPLANT SURGERY  06/2022   at [facility]   I & D EXTREMITY Bilateral 10/12/2022   Procedure: IRRIGATION AND  DEBRIDEMENT  BILATERAL LEGS;  Surgeon: Celena Sharper, MD;  Location: [facility] OR;  Service: Orthopedics;  Laterality: Bilateral;   I & D EXTREMITY Right 10/13/2022   Procedure: RIGHT THIGH WOUND VAC EXCHANGE;  Surgeon: Kendal Franky SQUIBB, MD;  Location: [facility] OR;  Service: Orthopedics;  Laterality: Right;   I & D EXTREMITY Bilateral 10/17/2022   Procedure: BILATERAL THIGH AND LEG DEBRIDEMENT, PARTIAL BILATERAL FOOT AMPUTATION;  Surgeon: Harden Jerona GAILS, MD;  Location: [facility] OR;  Service: Orthopedics;  Laterality: Bilateral;   I & D EXTREMITY Bilateral 11/05/2022   Procedure:  BILATERAL THIGH DEBRIDEMENT;  Surgeon: Harden Jerona GAILS, MD;  Location: [facility] OR;  Service: Orthopedics;  Laterality: Bilateral;   I & D EXTREMITY Bilateral 11/07/2022   Procedure: BILATERAL THIGH AND LEG DEBRIDEMENT;  Surgeon: Harden Jerona GAILS, MD;  Location: [facility] OR;  Service: Orthopedics;  Laterality: Bilateral;   I & D EXTREMITY Bilateral 11/12/2022   Procedure: BILATERAL LEG DEBRIDEMENTS;  Surgeon: Harden Jerona GAILS, MD;  Location: [facility] OR;  Service: Orthopedics;  Laterality: Bilateral;   IRRIGATION AND DEBRIDEMENT FOOT Bilateral 10/05/2022   Procedure: IRRIGATION AND DEBRIDEMENT FOOT;  Surgeon: Janit Thresa HERO, DPM;  Location: [facility]S;  Service: Orthopedics/Podiatry;  Laterality: Bilateral;   WISDOM TOOTH EXTRACTION     Patient Active Problem List   Diagnosis Date Noted   Influenza A 04/05/2023   Elevated lipase 04/05/2023   Esophagitis 03/24/2023   SIRS (systemic inflammatory response syndrome) (HCC) 03/02/2023   Fever 03/02/2023   Metabolic acidosis 03/02/2023   Hypertensive urgency 02/21/2023   Chronic pain 02/21/2023   Abscess of thigh 11/28/2022   C. 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   Hypokalemia 11/27/2022   Effusion, left knee 11/05/2022   MDD (major depressive disorder), recurrent episode, moderate (HCC) 10/16/2022   Necrotizing fasciitis of pelvic region and thigh (HCC) 10/08/2022   Necrotizing fasciitis of lower leg (HCC) 10/08/2022   Necrotizing fasciitis (HCC) 10/08/2022   Sepsis due to cellulitis (HCC) 10/03/2022   Cellulitis of lower extremity 10/03/2022   Multinodular goiter 06/18/2022   Malnutrition of moderate degree 04/18/2022   Intractable vomiting with nausea 04/17/2022   DKA (diabetic ketoacidosis) (HCC) 03/04/2022   Nausea vomiting and diarrhea 12/25/2021   Diabetic gastroparesis (HCC) 10/09/2021   Drug-seeking behavior 09/21/2021   Essential hypertension 08/25/2021   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) 08/25/2021   GERD without esophagitis 08/25/2021   Hematemesis    Prolonged Q-T interval on ECG    Nausea and vomiting 07/20/2021   Anemia of chronic disease 06/06/2021   Dehydration 06/04/2021   Hypomagnesemia 04/21/2021   Ulcerated, foot, left, with fat layer exposed (HCC) 04/20/2021   Uncontrolled type 1 diabetes mellitus with hyperglycemia, with long-term current use of insulin  (HCC) 12/18/2020   DM type 1 (diabetes mellitus, type 1) (HCC) 12/18/2020   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 11/09/2020   Diabetic retinopathy of both eyes associated with type 1 diabetes mellitus (HCC) 09/24/2020   Diabetic gastroparesis associated with type 1 diabetes mellitus (HCC) 08/12/2020   Acute kidney injury superimposed on chronic kidney disease 07/25/2020   Generalized abdominal pain 07/23/2020   GERD (gastroesophageal reflux disease) 07/23/2020   Diabetic nephropathy associated with type 1 diabetes mellitus (HCC) 06/27/2020   Sepsis (HCC) 05/27/2020   HLD (hyperlipidemia) 05/23/2020   Sinus tachycardia 05/09/2020   Anemia 04/20/2020   Depression with anxiety 07/05/2019   Diabetic polyneuropathy associated with type 1 diabetes mellitus (HCC) 09/24/2017   Gastroparesis    Goiter 10/05/2009   DM2 (diabetes mellitus, type 2) (HCC) 10/05/2009   Hypertension associated with diabetes (HCC) 10/05/2009    ONSET  DATE: 11/02/23 referral   REFERRING DIAG: [phone] (ICD-10-CM) - S/P AKA (above knee amputation) bilateral (HCC)   THERAPY DIAG:  Muscle weakness (generalized)  Other abnormalities of gait and mobility  Abnormal posture  Difficulty in walking, not elsewhere classified  Other symptoms and signs involving the musculoskeletal system  Rationale for Evaluation and Treatment: Rehabilitation  SUBJECTIVE:                                                                                                                                                                                              SUBJECTIVE STATEMENT: Patient arrives to clinic with SO, donning prosthetics in gym. Denies falls. She has not worn the prosthetics regularly - 2x outside of therapy.  She has not practiced standing at home.  Pt accompanied by: significant other  PERTINENT HISTORY: DM, diabetic gastroparesis, GERD, HTN, hyperthyroidism, normocytic anemia, B AKA  PAIN:  Are you having pain? No  PRECAUTIONS: Fall   PATIENT GOALS: to learn how to walk with new prosthetics                                                                                                                             TREATMENT   Gait: -sit <> stand in // bars with bars set at walker-width   -MinA to block knees and improve hip engagement ***STS x2 (used blue band on second attempt w/ better lateral control) -Ambulation x10 ft in // bars w/ minA to facilitate full lateral weight shift for swing through L more difficult than R -Practiced sitting x2 attempting to weight shift into toes w/o ability to bend knee component, was able to get R to bend w/ manual lift and flexion of L prosthesis but could not replicate on opposite side -  had pt adjust lanyard, feel for foot positioning, encouraged ind w/ direction of foot placement, turn prosthetics upside down for confirmed connection  PATIENT EDUCATION: Education details: continue HEP Person educated: Patient and SO Education method: Medical illustrator Education comprehension: verbalized understanding and needs further education  HOME EXERCISE PROGRAM: -practice donning/doffing  prosthetics as much as possible   GOALS: Goals reviewed with patient? Yes  SHORT TERM GOALS: Target date: 12/25/23  Pt will be independent with initial HEP for improved functional mobility and independence with new prosthetics  Baseline: to be provided Goal status: INITIAL  2.  Patient will complete sit <> stand with LRAD and no more than MinA  consistently  Baseline: up to MaxA in // bars Goal status: INITIAL  3.  Gait goal  Baseline: to be assessed  Goal status: INITIAL  4.  Patient will be able to don/doff B prosthetics with no more than MinA  Baseline: MaxA  Goal status: INITIAL  LONG TERM GOALS: Target date: 02/19/24  Pt will be independent with final HEP for improved functional mobility and independence with new prosthetics  Baseline: to be provided Goal status: INITIAL  2.  Patient will complete sit <> stand with LRAD with no more than supervision consistently  Baseline: MaxA in // bars  Goal status: INITIAL  3.  Gait goal Baseline: TBA Goal status: INITIAL  4.  Patient will traverse at least 1 community obstacle (curb vs ramp) with LRAD and no more than CGA Baseline: unable on eval  Goal status: INITIAL  5.  Patient will don/doff B prosthetics independently  Baseline: MaxA Goal status: INITIAL   ASSESSMENT:  CLINICAL IMPRESSION: Patient seen for skilled PT session with emphasis on gait training in prosthetics. Addition of textured tape seemed to assist patient with ability to don/doff prosthetics. PT unable to get B knees to trigger into flexion despite multiple adjustments to knee resistance. PT questioning if B MPKs were turned on by prosthetist. Continue POC.   OBJECTIVE IMPAIRMENTS: Abnormal gait, cardiopulmonary status limiting activity, decreased activity tolerance, decreased balance, decreased endurance, decreased knowledge of use of DME, decreased mobility, difficulty walking, decreased strength, impaired vision/preception, and prosthetic dependency .   ACTIVITY LIMITATIONS: carrying, lifting, bending, standing, squatting, stairs, locomotion level, and caring for others  PARTICIPATION LIMITATIONS: meal prep, cleaning, interpersonal relationship, driving, shopping, community activity, and occupation  PERSONAL FACTORS: Age, Behavior pattern, Fitness, Past/current experiences, Social background, Time  since onset of injury/illness/exacerbation, Transportation, and 3+ comorbidities: see above are also affecting patient's functional outcome.   REHAB POTENTIAL: Fair complexity of comorbidities   CLINICAL DECISION MAKING: Evolving/moderate complexity  EVALUATION COMPLEXITY: Moderate  PLAN:  PT FREQUENCY: 2x/week  PT DURATION: 12 weeks  PLANNED INTERVENTIONS: [id number]- PT Re-evaluation, 97750- Physical Performance Testing, 97110-Therapeutic exercises, 97530- Therapeutic activity, [id number]- Neuromuscular re-education, 97535- Self Care, [id number]- Manual therapy, [id number]- Gait training, [id number]- Orthotic Initial, [id number]- Prosthetic Initial , [phone]- Orthotic/Prosthetic subsequent, [phone]- Canalith repositioning, [id number]- Aquatic Therapy, [phone] (1-2 muscles), [id number] (3+ muscles)- Dry Needling, Patient/Family education, Balance training, Stair training, Vestibular training, Visual/preceptual remediation/compensation, DME instructions, Wheelchair mobility training, Cryotherapy, and Moist heat  PLAN FOR NEXT SESSION: assess gait + goal when able, sink HEP?, adjust knee resistance in app PRN   Do we need to contact Chyrl?  She is to see him 10/30.  Wrote note for them to ask follow-up questions 10/20.  Chyrl Delon Pop and I have been working with Nhyla on the use of her bilateral MPK prosthetics.  We were wondering if the basic mode she is in could be changed to a mode that allows us  to better use the eccentric control components of the mpk (as she currently is having to train with these as a safety knee)?  Any insight and response are appreciated.  Daved Bull, PT, DPT   Elvena Oyer  KATHEE Bull, PT, DPT  12/07/2023, 2:05 PM

## 2023-12-07 NOTE — Patient Instructions (Signed)
 Kelli Hudson and I have been working with [name] on the use of her bilateral MPK prosthetics.  We were wondering if the basic mode she is in could be changed to a mode that allows us  to better use the eccentric control components of the mpk (as she currently is having to train with these as a safety knee)?  Any insight and response are appreciated.  Daved Bull, PT, DPT

## 2023-12-10 ENCOUNTER — Ambulatory Visit

## 2023-12-10 DIAGNOSIS — R2689 Other abnormalities of gait and mobility: Secondary | ICD-10-CM

## 2023-12-10 DIAGNOSIS — M6281 Muscle weakness (generalized): Secondary | ICD-10-CM | POA: Diagnosis not present

## 2023-12-10 DIAGNOSIS — R262 Difficulty in walking, not elsewhere classified: Secondary | ICD-10-CM

## 2023-12-10 DIAGNOSIS — R293 Abnormal posture: Secondary | ICD-10-CM

## 2023-12-10 NOTE — Therapy (Signed)
 OUTPATIENT PHYSICAL THERAPY NEURO TREATMENT   Patient Name: Kelli Hudson MRN: [id number] DOB:10-09-1990, 33 y.o., female Today's Date: 12/10/2023   PCP: Daphne Lesches, NP REFERRING PROVIDER: Jerona Sage, MD  END OF SESSION:  PT End of Session - 12/10/23 1307     Visit Number 4    Number of Visits 25    Date for Recertification  02/19/24    Authorization Type U[employer] dual    Progress Note Due on Visit 10    PT Start Time 1315    PT Stop Time 1404    PT Time Calculation (min) 49 min    Equipment Utilized During Treatment Gait belt    Activity Tolerance Patient tolerated treatment well    Behavior During Therapy WFL for tasks assessed/performed          Past Medical History:  Diagnosis Date   Acute H. pylori gastric ulcer    Coffee ground emesis    Diabetes mellitus (HCC)    Diabetic gastroparesis (HCC)    DKA (diabetic ketoacidosis) (HCC) 02/24/2021   Gastroparesis    GERD (gastroesophageal reflux disease)    Hypertension    Hyperthyroidism    Intractable nausea and vomiting 04/20/2021   Normocytic anemia 04/20/2020   Prolonged Q-T interval on ECG    Past Surgical History:  Procedure Laterality Date   AMPUTATION Bilateral 10/24/2022   Procedure: BILATERAL BELOW KNEE AMPUTATION;  Surgeon: Sage Jerona GAILS, MD;  Location: [facility] OR;  Service: Orthopedics;  Laterality: Bilateral;   AMPUTATION Bilateral 10/08/2022   Procedure: BILATERAL LEG DEBRIDEMENT;  Surgeon: Sage Jerona GAILS, MD;  Location: [facility] OR;  Service: Orthopedics;  Laterality: Bilateral;   AMPUTATION Bilateral 11/14/2022   Procedure: BILATERAL ABOVE KNEE AMPUTATION;  Surgeon: Sage Jerona GAILS, MD;  Location: [facility] OR;  Service: Orthopedics;  Laterality: Bilateral;   AMPUTATION TOE Left 03/10/2018   Procedure: AMPUTATION FIFTH TOE;  Surgeon: Gershon Donnice SAUNDERS, DPM;  Location: [facility];  Service: Podiatry;  Laterality: Left;   APPLICATION OF WOUND VAC Bilateral 10/12/2022   Procedure:  APPLICATION OF WOUND VAC BILATERAL LEGS;  Surgeon: Celena Sharper, MD;  Location: [facility] OR;  Service: Orthopedics;  Laterality: Bilateral;   APPLICATION OF WOUND VAC Bilateral 10/24/2022   Procedure: APPLICATION OF WOUND VAC;  Surgeon: Sage Jerona GAILS, MD;  Location: [facility] OR;  Service: Orthopedics;  Laterality: Bilateral;   BIOPSY  01/28/2020   Procedure: BIOPSY;  Surgeon: Elicia Claw, MD;  Location: [facility];  Service: Gastroenterology;;   BOTOX  INJECTION  08/26/2021   Procedure: BOTOX  INJECTION;  Surgeon: Legrand Victory LITTIE DOUGLAS, MD;  Location: [facility];  Service: Gastroenterology;;   ESOPHAGOGASTRODUODENOSCOPY N/A 01/28/2020   Procedure: ESOPHAGOGASTRODUODENOSCOPY (EGD);  Surgeon: Elicia Claw, MD;  Location: [facility];  Service: Gastroenterology;  Laterality: N/A;   ESOPHAGOGASTRODUODENOSCOPY (EGD) WITH PROPOFOL  Left 09/08/2015   Procedure: ESOPHAGOGASTRODUODENOSCOPY (EGD) WITH PROPOFOL ;  Surgeon: Elsie Cree, MD;  Location: [facility] ENDOSCOPY;  Service: Endoscopy;  Laterality: Left;   ESOPHAGOGASTRODUODENOSCOPY (EGD) WITH PROPOFOL  N/A 08/26/2021   Procedure: ESOPHAGOGASTRODUODENOSCOPY (EGD) WITH PROPOFOL ;  Surgeon: Legrand Victory LITTIE DOUGLAS, MD;  Location: [facility];  Service: Gastroenterology;  Laterality: N/A;   GASTRIC STIMULATOR IMPLANT SURGERY  06/2022   at [facility]   I & D EXTREMITY Bilateral 10/12/2022   Procedure: IRRIGATION AND  DEBRIDEMENT  BILATERAL LEGS;  Surgeon: Celena Sharper, MD;  Location: [facility] OR;  Service: Orthopedics;  Laterality: Bilateral;   I & D EXTREMITY Right 10/13/2022   Procedure: RIGHT THIGH WOUND VAC EXCHANGE;  Surgeon: Kendal Franky SQUIBB, MD;  Location: [facility] OR;  Service: Orthopedics;  Laterality: Right;   I & D EXTREMITY Bilateral 10/17/2022   Procedure: BILATERAL THIGH AND LEG DEBRIDEMENT, PARTIAL BILATERAL FOOT AMPUTATION;  Surgeon: Harden Jerona GAILS, MD;  Location: [facility] OR;  Service: Orthopedics;  Laterality: Bilateral;   I & D EXTREMITY Bilateral 11/05/2022   Procedure:  BILATERAL THIGH DEBRIDEMENT;  Surgeon: Harden Jerona GAILS, MD;  Location: [facility] OR;  Service: Orthopedics;  Laterality: Bilateral;   I & D EXTREMITY Bilateral 11/07/2022   Procedure: BILATERAL THIGH AND LEG DEBRIDEMENT;  Surgeon: Harden Jerona GAILS, MD;  Location: [facility] OR;  Service: Orthopedics;  Laterality: Bilateral;   I & D EXTREMITY Bilateral 11/12/2022   Procedure: BILATERAL LEG DEBRIDEMENTS;  Surgeon: Harden Jerona GAILS, MD;  Location: [facility] OR;  Service: Orthopedics;  Laterality: Bilateral;   IRRIGATION AND DEBRIDEMENT FOOT Bilateral 10/05/2022   Procedure: IRRIGATION AND DEBRIDEMENT FOOT;  Surgeon: Janit Thresa HERO, DPM;  Location: [facility]S;  Service: Orthopedics/Podiatry;  Laterality: Bilateral;   WISDOM TOOTH EXTRACTION     Patient Active Problem List   Diagnosis Date Noted   Influenza A 04/05/2023   Elevated lipase 04/05/2023   Esophagitis 03/24/2023   SIRS (systemic inflammatory response syndrome) (HCC) 03/02/2023   Fever 03/02/2023   Metabolic acidosis 03/02/2023   Hypertensive urgency 02/21/2023   Chronic pain 02/21/2023   Abscess of thigh 11/28/2022   C. 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   Hypokalemia 11/27/2022   Effusion, left knee 11/05/2022   MDD (major depressive disorder), recurrent episode, moderate (HCC) 10/16/2022   Necrotizing fasciitis of pelvic region and thigh (HCC) 10/08/2022   Necrotizing fasciitis of lower leg (HCC) 10/08/2022   Necrotizing fasciitis (HCC) 10/08/2022   Sepsis due to cellulitis (HCC) 10/03/2022   Cellulitis of lower extremity 10/03/2022   Multinodular goiter 06/18/2022   Malnutrition of moderate degree 04/18/2022   Intractable vomiting with nausea 04/17/2022   DKA (diabetic ketoacidosis) (HCC) 03/04/2022   Nausea vomiting and diarrhea 12/25/2021   Diabetic gastroparesis (HCC) 10/09/2021   Drug-seeking behavior 09/21/2021   Essential hypertension 08/25/2021   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) 08/25/2021   GERD without esophagitis 08/25/2021   Hematemesis    Prolonged Q-T interval on ECG    Nausea and vomiting 07/20/2021   Anemia of chronic disease 06/06/2021   Dehydration 06/04/2021   Hypomagnesemia 04/21/2021   Ulcerated, foot, left, with fat layer exposed (HCC) 04/20/2021   Uncontrolled type 1 diabetes mellitus with hyperglycemia, with long-term current use of insulin  (HCC) 12/18/2020   DM type 1 (diabetes mellitus, type 1) (HCC) 12/18/2020   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 11/09/2020   Diabetic retinopathy of both eyes associated with type 1 diabetes mellitus (HCC) 09/24/2020   Diabetic gastroparesis associated with type 1 diabetes mellitus (HCC) 08/12/2020   Acute kidney injury superimposed on chronic kidney disease 07/25/2020   Generalized abdominal pain 07/23/2020   GERD (gastroesophageal reflux disease) 07/23/2020   Diabetic nephropathy associated with type 1 diabetes mellitus (HCC) 06/27/2020   Sepsis (HCC) 05/27/2020   HLD (hyperlipidemia) 05/23/2020   Sinus tachycardia 05/09/2020   Anemia 04/20/2020   Depression with anxiety 07/05/2019   Diabetic polyneuropathy associated with type 1 diabetes mellitus (HCC) 09/24/2017   Gastroparesis    Goiter 10/05/2009   DM2 (diabetes mellitus, type 2) (HCC) 10/05/2009   Hypertension associated with diabetes (HCC) 10/05/2009    ONSET  DATE: 11/02/23 referral   REFERRING DIAG: [phone] (ICD-10-CM) - S/P AKA (above knee amputation) bilateral (HCC)   THERAPY DIAG:  Muscle weakness (generalized)  Other abnormalities of gait and mobility  Abnormal posture  Difficulty in walking, not elsewhere classified  Rationale for Evaluation and Treatment: Rehabilitation  SUBJECTIVE:                                                                                                                                                                                             SUBJECTIVE  STATEMENT: Patient arrives to clinic with SO. Was able to don B prosthetics with very little help from SO. Practiced this 1x at home. Reports it's getting easier. Denies falls.  Pt accompanied by: significant other  PERTINENT HISTORY: DM, diabetic gastroparesis, GERD, HTN, hyperthyroidism, normocytic anemia, B AKA  PAIN:  Are you having pain? No  PRECAUTIONS: Fall   PATIENT GOALS: to learn how to walk with new prosthetics                                                                                                                             TREATMENT  Theract: -time spent calling [facility] to coordinate orthotist coming to clinic to assist with managing B MPKs   Gait: -pregait in // bars with ModA to come to stand working on progressing to less UE support on bars   -lateral weight shifts  -x4 laps in // bars with CGA + intermittent assist with accurate placement of L LE   -tendency toward wider BOS with L LE ER  PATIENT EDUCATION: Education details: continue HEP Person educated: Patient and SO Education method: Medical illustrator Education comprehension: verbalized understanding and needs further education  HOME EXERCISE PROGRAM: -practice donning/doffing prosthetics as much as possible   GOALS: Goals reviewed with patient? Yes  SHORT TERM GOALS: Target date: 12/25/23  Pt will be independent with initial HEP for improved functional mobility and independence with new prosthetics  Baseline: to be provided Goal status: INITIAL  2.  Patient will complete sit <> stand with LRAD and no  more than MinA consistently  Baseline: up to MaxA in // bars Goal status: INITIAL  3.  Patient will initiate ambulation with an ambulatory device with assist as needed Baseline: only able to ambulate in // bars Goal status: REVISED  4.  Patient will be able to don/doff B prosthetics with no more than MinA  Baseline: MaxA  Goal status: INITIAL  LONG TERM GOALS: Target  date: 02/19/24  Pt will be independent with final HEP for improved functional mobility and independence with new prosthetics  Baseline: to be provided Goal status: INITIAL  2.  Patient will complete sit <> stand with LRAD with no more than supervision consistently  Baseline: MaxA in // bars  Goal status: INITIAL  3.  Patient will ambulate >/=72ft with ambulatory device and no more than MinA Baseline: unable-only ambulating in // bars Goal status: REVISED  4.  Patient will traverse at least 1 community obstacle (curb vs ramp) with LRAD and no more than CGA Baseline: unable on eval  Goal status: INITIAL  5.  Patient will don/doff B prosthetics independently  Baseline: MaxA Goal status: INITIAL   ASSESSMENT:  CLINICAL IMPRESSION: Patient seen for skilled PT session with emphasis on continuing gait training with B MPKs. Prosthetist unable to problem solve knee flexion issues remotely and so time spent coordinating with prosthetist to come to clinic to observe patient and assist from the clinic. She is progressing in her ability to maintain static standing balance point with lessening UE support. Due to inability to flex B knees through swing phase of gait, she has adopted a very wide gait pattern, which PT assists with correcting for. Continue POC.   OBJECTIVE IMPAIRMENTS: Abnormal gait, cardiopulmonary status limiting activity, decreased activity tolerance, decreased balance, decreased endurance, decreased knowledge of use of DME, decreased mobility, difficulty walking, decreased strength, impaired vision/preception, and prosthetic dependency .   ACTIVITY LIMITATIONS: carrying, lifting, bending, standing, squatting, stairs, locomotion level, and caring for others  PARTICIPATION LIMITATIONS: meal prep, cleaning, interpersonal relationship, driving, shopping, community activity, and occupation  PERSONAL FACTORS: Age, Behavior pattern, Fitness, Past/current experiences, Social background,  Time since onset of injury/illness/exacerbation, Transportation, and 3+ comorbidities: see above are also affecting patient's functional outcome.   REHAB POTENTIAL: Fair complexity of comorbidities   CLINICAL DECISION MAKING: Evolving/moderate complexity  EVALUATION COMPLEXITY: Moderate  PLAN:  PT FREQUENCY: 2x/week  PT DURATION: 12 weeks  PLANNED INTERVENTIONS: 97164- PT Re-evaluation, 97750- Physical Performance Testing, 97110-Therapeutic exercises, 97530- Therapeutic activity, [id number]- Neuromuscular re-education, 97535- Self Care, [id number]- Manual therapy, [id number]- Gait training, [id number]- Orthotic Initial, [id number]- Prosthetic Initial , [phone]- Orthotic/Prosthetic subsequent, [phone]- Canalith repositioning, [id number]- Aquatic Therapy, [phone] (1-2 muscles), [id number] (3+ muscles)- Dry Needling, Patient/Family education, Balance training, Stair training, Vestibular training, Visual/preceptual remediation/compensation, DME instructions, Wheelchair mobility training, Cryotherapy, and Moist heat  PLAN FOR NEXT SESSION: sink HEP?, adjust knee resistance in app PRN, stand from higher mat to walker? (Maybe put bioness harness on?  Did Clint call back Bronwen OOO on 10/23) to coordinate time to come to clinic    [name], PT [name], PT, DPT, CBIS  12/10/2023, 2:23 PM

## 2023-12-11 ENCOUNTER — Other Ambulatory Visit (HOSPITAL_COMMUNITY): Payer: Self-pay | Admitting: Nephrology

## 2023-12-11 ENCOUNTER — Telehealth: Payer: Self-pay | Admitting: Orthopedic Surgery

## 2023-12-11 ENCOUNTER — Telehealth (HOSPITAL_COMMUNITY): Payer: Self-pay

## 2023-12-11 DIAGNOSIS — D631 Anemia in chronic kidney disease: Secondary | ICD-10-CM | POA: Insufficient documentation

## 2023-12-11 NOTE — Telephone Encounter (Signed)
 Auth Submission: NO AUTH NEEDED Site of care: Site of care: [facility] Payer: [employer] Medicare, [place] [employer] Medicaid Medication & CPT/J Code(s) submitted: Retacrit ([id number]) Diagnosis Code: N18.4/D63.1 Route of submission (phone, fax, portal): portal Phone # Fax # Auth type: Buy/Bill HB Units/visits requested: 10000 units q2weeks Reference number: [id number] Approval from: 12/11/23 to 02/17/24

## 2023-12-11 NOTE — Telephone Encounter (Signed)
 Pt called and states she has called several time asking for new smaller Shrinkers and have been waiting for a response. Please call pt about this matter at [phone].

## 2023-12-11 NOTE — Telephone Encounter (Signed)
 Can you please write order for pt to have shrinkers? She has not been in the office for a year will [facility] accept rx without a face to face do you know just for shrinkers?

## 2023-12-14 ENCOUNTER — Encounter: Payer: Self-pay | Admitting: Physical Therapy

## 2023-12-14 ENCOUNTER — Ambulatory Visit: Admitting: Physical Therapy

## 2023-12-14 DIAGNOSIS — R262 Difficulty in walking, not elsewhere classified: Secondary | ICD-10-CM

## 2023-12-14 DIAGNOSIS — M6281 Muscle weakness (generalized): Secondary | ICD-10-CM | POA: Diagnosis not present

## 2023-12-14 DIAGNOSIS — R29898 Other symptoms and signs involving the musculoskeletal system: Secondary | ICD-10-CM

## 2023-12-14 DIAGNOSIS — R293 Abnormal posture: Secondary | ICD-10-CM

## 2023-12-14 DIAGNOSIS — R2689 Other abnormalities of gait and mobility: Secondary | ICD-10-CM

## 2023-12-14 NOTE — Telephone Encounter (Signed)
 I called and sw pt and advised that rx had been written and picked up by [facility] today and she states that she does not use the [place] office and wants the rx to go to the [place] office. I advised that I would get in touch with the [place] location and ask them to send to that office. Advised pt to call and make sure for the shrinkers she does not need to have a recent office visit because it has been 1 year since she was in the office. Pt voiced understanding and will call with any other questions.

## 2023-12-14 NOTE — Therapy (Signed)
 OUTPATIENT PHYSICAL THERAPY NEURO TREATMENT   Patient Name: Kelli Hudson MRN: [id number] DOB:1991-02-18, 33 y.o., female Today's Date: 12/14/2023   PCP: Daphne Lesches, NP REFERRING PROVIDER: Jerona Sage, MD  END OF SESSION:  PT End of Session - 12/14/23 1321     Visit Number 5    Number of Visits 25    Date for Recertification  02/19/24    Authorization Type [employer] dual    Progress Note Due on Visit 10    PT Start Time 1316    PT Stop Time 1401    PT Time Calculation (min) 45 min    Equipment Utilized During Treatment Gait belt    Activity Tolerance Patient tolerated treatment well    Behavior During Therapy WFL for tasks assessed/performed          Past Medical History:  Diagnosis Date   Acute H. pylori gastric ulcer    Coffee ground emesis    Diabetes mellitus (HCC)    Diabetic gastroparesis (HCC)    DKA (diabetic ketoacidosis) (HCC) 02/24/2021   Gastroparesis    GERD (gastroesophageal reflux disease)    Hypertension    Hyperthyroidism    Intractable nausea and vomiting 04/20/2021   Normocytic anemia 04/20/2020   Prolonged Q-T interval on ECG    Past Surgical History:  Procedure Laterality Date   AMPUTATION Bilateral 10/24/2022   Procedure: BILATERAL BELOW KNEE AMPUTATION;  Surgeon: Sage Jerona GAILS, MD;  Location: [facility] OR;  Service: Orthopedics;  Laterality: Bilateral;   AMPUTATION Bilateral 10/08/2022   Procedure: BILATERAL LEG DEBRIDEMENT;  Surgeon: Sage Jerona GAILS, MD;  Location: [facility] OR;  Service: Orthopedics;  Laterality: Bilateral;   AMPUTATION Bilateral 11/14/2022   Procedure: BILATERAL ABOVE KNEE AMPUTATION;  Surgeon: Sage Jerona GAILS, MD;  Location[facility] OR;  Service: Orthopedics;  Laterality: Bilateral;   AMPUTATION TOE Left 03/10/2018   Procedure: AMPUTATION FIFTH TOE;  Surgeon: Gershon Donnice SAUNDERS, DPM;  Location: [facility];  Service: Podiatry;  Laterality: Left;   APPLICATION OF WOUND VAC Bilateral 10/12/2022   Procedure:  APPLICATION OF WOUND VAC BILATERAL LEGS;  Surgeon: Celena Sharper, MD;  Location: [facility] OR;  Service: Orthopedics;  Laterality: Bilateral;   APPLICATION OF WOUND VAC Bilateral 10/24/2022   Procedure: APPLICATION OF WOUND VAC;  Surgeon: Sage Jerona GAILS, MD;  Location: [facility] OR;  Service: Orthopedics;  Laterality: Bilateral;   BIOPSY  01/28/2020   Procedure: BIOPSY;  Surgeon: Elicia Claw, MD;  Location: [facility];  Service: Gastroenterology;;   BOTOX  INJECTION  08/26/2021   Procedure: BOTOX  INJECTION;  Surgeon: Legrand Victory LITTIE DOUGLAS, MD;  Location: [facility];  Service: Gastroenterology;;   ESOPHAGOGASTRODUODENOSCOPY N/A 01/28/2020   Procedure: ESOPHAGOGASTRODUODENOSCOPY (EGD);  Surgeon: Elicia Claw, MD;  Location: [facility];  Service: Gastroenterology;  Laterality: N/A;   ESOPHAGOGASTRODUODENOSCOPY (EGD) WITH PROPOFOL  Left 09/08/2015   Procedure: ESOPHAGOGASTRODUODENOSCOPY (EGD) WITH PROPOFOL ;  Surgeon: Elsie Cree, MD;  Location: [facility] END[facility]COPY;  Service: Endoscopy;  Laterality: Left;   ESOPHAGOGASTRODUODENOSCOPY (EGD) WITH PROPOFOL  N/A 08/26/2021   Procedure: ESOPHAGOGASTRODUODENOSCOPY (EGD) WITH PROPOFOL ;  Surgeon: Legrand Victory LITTIE DOUGLAS, MD;  Location: [facility];  Service: Gastroenterology;  Laterality: N/A;   GASTRIC STIMULATOR IMPLANT SURGERY  06/2022   at [facility]   I & D EXTREMITY Bilateral 10/12/2022   Procedure: IRRIGATION AND  DEBRIDEMENT  BILATERAL LEGS;  Surgeon: Celena Sharper, MD;  Location: [facility] OR;  Service: Orthopedics;  Laterality: Bilateral;   I & D EXTREMITY Right 10/13/2022   Procedure: RIGHT THIGH WOUND VAC EXCHANGE;  Surgeon: Kendal Franky SQUIBB, MD;  Location: [facility] OR;  Service: Orthopedics;  Laterality: Right;   I & D EXTREMITY Bilateral 10/17/2022   Procedure: BILATERAL THIGH AND LEG DEBRIDEMENT, PARTIAL BILATERAL FOOT AMPUTATION;  Surgeon: Harden Jerona GAILS, MD;  Location: [facility] OR;  Service: Orthopedics;  Laterality: Bilateral;   I & D EXTREMITY Bilateral 11/05/2022   Procedure:  BILATERAL THIGH DEBRIDEMENT;  Surgeon: Harden Jerona GAILS, MD;  Location: [facility] OR;  Service: Orthopedics;  Laterality: Bilateral;   I & D EXTREMITY Bilateral 11/07/2022   Procedure: BILATERAL THIGH AND LEG DEBRIDEMENT;  Surgeon: Harden Jerona GAILS, MD;  Location: [facility] OR;  Service: Orthopedics;  Laterality: Bilateral;   I & D EXTREMITY Bilateral 11/12/2022   Procedure: BILATERAL LEG DEBRIDEMENTS;  Surgeon: Harden Jerona GAILS, MD;  Location: [facility] OR;  Service: Orthopedics;  Laterality: Bilateral;   IRRIGATION AND DEBRIDEMENT FOOT Bilateral 10/05/2022   Procedure: IRRIGATION AND DEBRIDEMENT FOOT;  Surgeon: Janit Thresa HERO, DPM;  Location: [facility] ORS;  Service: Orthopedics/Podiatry;  Laterality: Bilateral;   WISDOM TOOTH EXTRACTION     Patient Active Problem List   Diagnosis Date Noted   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   Hypokalemia 11/27/2022   Effusion, left knee 11/05/2022   MDD (major depressive disorder), recurrent episode, moderate (HCC) 10/16/2022   Necrotizing fasciitis of pelvic region and thigh (HCC) 10/08/2022   Necrotizing fasciitis of lower leg (HCC) 10/08/2022   Necrotizing fasciitis (HCC) 10/08/2022   Sepsis due to cellulitis (HCC) 10/03/2022   Cellulitis of lower extremity 10/03/2022   Multinodular goiter 06/18/2022   Malnutrition of moderate degree 04/18/2022   Intractable vomiting with nausea 04/17/2022   DKA (diabetic ketoacidosis) (HCC) 03/04/2022   Nausea vomiting and diarrhea 12/25/2021   Diabetic gastroparesis (HCC) 10/09/2021   Drug-seeking behavior  09/21/2021   Essential hypertension 08/25/2021   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) 08/25/2021   GERD without esophagitis 08/25/2021   Hematemesis    Prolonged Q-T interval on ECG    Nausea and vomiting 07/20/2021   Anemia of chronic disease 06/06/2021   Dehydration 06/04/2021   Hypomagnesemia 04/21/2021   Ulcerated, foot, left, with fat layer exposed (HCC) 04/20/2021   Uncontrolled type 1 diabetes mellitus with hyperglycemia, with long-term current use of insulin  (HCC) 12/18/2020   DM type 1 (diabetes mellitus, type 1) (HCC) 12/18/2020   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 11/09/2020   Diabetic retinopathy of both eyes associated with type 1 diabetes mellitus (HCC) 09/24/2020   Diabetic gastroparesis associated with type 1 diabetes mellitus (HCC) 08/12/2020   Acute kidney injury superimposed on chronic kidney disease 07/25/2020   Generalized abdominal pain 07/23/2020   GERD (gastroesophageal reflux disease) 07/23/2020   Diabetic nephropathy associated with type 1 diabetes mellitus (HCC) 06/27/2020   Sepsis (HCC) 05/27/2020   HLD (hyperlipidemia) 05/23/2020   Sinus tachycardia 05/09/2020   Anemia 04/20/2020   Depression with anxiety 07/05/2019   Diabetic polyneuropathy associated with type 1 diabetes mellitus (HCC) 09/24/2017   Gastroparesis    Goiter 10/05/2009   DM2 (diabetes mellitus, type  2) (HCC) 10/05/2009   Hypertension associated with diabetes (HCC) 10/05/2009    ONSET DATE: 11/02/23 referral   REFERRING DIAG: [phone] (ICD-10-CM) - S/P AKA (above knee amputation) bilateral (HCC)   THERAPY DIAG:  Muscle weakness (generalized)  Other abnormalities of gait and mobility  Abnormal posture  Difficulty in walking, not elsewhere classified  Other symptoms and signs involving the musculoskeletal system  Rationale for Evaluation and Treatment: Rehabilitation  SUBJECTIVE:                                                                                                                                                                                              SUBJECTIVE STATEMENT: Patient arrives to clinic with SO w/ prosthetics donned.  She reports she did this independently.  They brought her socks, but didn't think to put any on beforehand so she asks to don prior to standing. Denies falls.  Pt accompanied by: significant other  PERTINENT HISTORY: DM, diabetic gastroparesis, GERD, HTN, hyperthyroidism, normocytic anemia, B AKA  PAIN:  Are you having pain? No - reports pain in left groin region w/ pressure from socket  PRECAUTIONS: Fall   PATIENT GOALS: to learn how to walk with new prosthetics                                                                                                                             TREATMENT  Theract: -pt dons 5-ply (PT has to tell pt ply based on reported fit of socket as pt cannot see tag/label) then redons prosthetic -Pt stands x1 w/ BUE pull to stand, PT facilitating knees into full extension and hips forward for upright posture - able to bend right prosthetic on return to sit to don left 3-ply due to full weight bearing on prosthesis. -Emphasis on pt independence w/ wheelchair management/setup and foot positioning prior to stands -Pt able to ambulate x6 ft forward then backward w/ PT providing minA for foot placement L > R -Practiced sit x2 in R rear stride to facilitate bend - unable to obtain (possibly due to slightly forward  trunk) > repeated x2 in left rear stride w/ pt able to achieve bend x2, second bend slightly delayed, but better upright posture seemed to facilitate better initiation of knee bend -Had pt attempt push up from wheelchair vs pulling to stand, not quite able to transition to bars this way, but able to push to stand on // bars w/ better upright alignment  -Attempted sitting w/ BLE adjacent x2, only able to get both knees to bend on second attempt w/ more  individualized weight shift (delayed L response compared to R)  PATIENT EDUCATION: Education details: Continue HEP.  Follow-up w/ [facility] regarding them coming to clinic or pt going to them for assessment of MPK functioning/eccentric control (It is okay to miss a PT appt if [facility] can fit her in).  Discuss left socket flare for comfort w/ prosthetist. Person educated: Patient and SO Education method: Explanation and Demonstration Education comprehension: verbalized understanding and needs further education  HOME EXERCISE PROGRAM: -practice donning/doffing prosthetics as much as possible   GOALS: Goals reviewed with patient? Yes  SHORT TERM GOALS: Target date: 12/25/23  Pt will be independent with initial HEP for improved functional mobility and independence with new prosthetics  Baseline: to be provided Goal status: INITIAL  2.  Patient will complete sit <> stand with LRAD and no more than MinA consistently  Baseline: up to MaxA in // bars Goal status: INITIAL  3.  Patient will initiate ambulation with an ambulatory device with assist as needed Baseline: only able to ambulate in // bars Goal status: REVISED  4.  Patient will be able to don/doff B prosthetics with no more than MinA  Baseline: MaxA  Goal status: INITIAL  LONG TERM GOALS: Target date: 02/19/24  Pt will be independent with final HEP for improved functional mobility and independence with new prosthetics  Baseline: to be provided Goal status: INITIAL  2.  Patient will complete sit <> stand with LRAD with no more than supervision consistently  Baseline: MaxA in // bars  Goal status: INITIAL  3.  Patient will ambulate >/=100ft with ambulatory device and no more than MinA Baseline: unable-only ambulating in // bars Goal status: REVISED  4.  Patient will traverse at least 1 community obstacle (curb vs ramp) with LRAD and no more than CGA Baseline: unable on eval  Goal status: INITIAL  5.  Patient will don/doff B  prosthetics independently  Baseline: MaxA Goal status: INITIAL   ASSESSMENT:  CLINICAL IMPRESSION: Patient seen for skilled PT session with emphasis on continued use of MPK componentry to improve ambulatory and STS mechanics.  Able to obtain unreliable/inconsistent knee flexion w/ increased weight through SL more so than w/ limbs adjacent.  She would likely benefit from [facility] consult to further improve MPK activation as well as to flare socket to improve medial thigh comfort.  She benefits from ongoing skilled PT to address prosthetic management and gait training to optimize independence.  Continue POC.   OBJECTIVE IMPAIRMENTS: Abnormal gait, cardiopulmonary status limiting activity, decreased activity tolerance, decreased balance, decreased endurance, decreased knowledge of use of DME, decreased mobility, difficulty walking, decreased strength, impaired vision/preception, and prosthetic dependency .   ACTIVITY LIMITATIONS: carrying, lifting, bending, standing, squatting, stairs, locomotion level, and caring for others  PARTICIPATION LIMITATIONS: meal prep, cleaning, interpersonal relationship, driving, shopping, community activity, and occupation  PERSONAL FACTORS: Age, Behavior pattern, Fitness, Past/current experiences, Social background, Time since onset of injury/illness/exacerbation, Transportation, and 3+ comorbidities: see above are also affecting patient's functional outcome.  REHAB POTENTIAL: Fair complexity of comorbidities   CLINICAL DECISION MAKING: Evolving/moderate complexity  EVALUATION COMPLEXITY: Moderate  PLAN:  PT FREQUENCY: 2x/week  PT DURATION: 12 weeks  PLANNED INTERVENTIONS: [id number]- PT Re-evaluation, 97750- Physical Performance Testing, 97110-Therapeutic exercises, 97530- Therapeutic activity, [id number]- Neuromuscular re-education, 97535- Self Care, [id number]- Manual therapy, [id number]- Gait training, [id number]- Orthotic Initial, [id number]- Prosthetic Initial , [phone]-  Orthotic/Prosthetic subsequent, [phone]- Canalith repositioning, [id number]- Aquatic Therapy, [phone] (1-2 muscles), 20561 (3+ muscles)- Dry Needling, Patient/Family education, Balance training, Stair training, Vestibular training, Visual/preceptual remediation/compensation, DME instructions, Wheelchair mobility training, Cryotherapy, and Moist heat  PLAN FOR NEXT SESSION: sink HEP?, adjust knee resistance in app PRN, stand from higher mat to walker? (Maybe put bioness harness on?  Did Clint call back [facility] on 10/23) to coordinate time to come to clinic - did patient call them to either move appt or follow-up about them coming into clinic?    Daved KATHEE Bull, PT, DPT  12/14/2023, 2:03 PM

## 2023-12-15 NOTE — Telephone Encounter (Signed)
 Message sent to [facility] to advise to send to the [facility] office.

## 2023-12-17 ENCOUNTER — Ambulatory Visit

## 2023-12-17 DIAGNOSIS — R2689 Other abnormalities of gait and mobility: Secondary | ICD-10-CM

## 2023-12-17 DIAGNOSIS — R262 Difficulty in walking, not elsewhere classified: Secondary | ICD-10-CM

## 2023-12-17 DIAGNOSIS — R293 Abnormal posture: Secondary | ICD-10-CM

## 2023-12-17 DIAGNOSIS — M6281 Muscle weakness (generalized): Secondary | ICD-10-CM

## 2023-12-17 NOTE — Therapy (Signed)
 OUTPATIENT PHYSICAL THERAPY NEURO TREATMENT   Patient Name: Kelli Hudson MRN: [id number] DOB:19-Oct-1990, 33 y.o., female Today's Date: 12/17/2023   PCP: Daphne Lesches, NP REFERRING PROVIDER: Jerona Sage, MD  END OF SESSION:  PT End of Session - 12/17/23 1403     Visit Number 6    Number of Visits 25    Date for Recertification  02/19/24    Authorization Type [employer] dual    Progress Note Due on Visit 10    PT Start Time 1312    PT Stop Time 1357    PT Time Calculation (min) 45 min    Equipment Utilized During Treatment Gait belt;Other (comment)   BWS harness   Activity Tolerance Patient tolerated treatment well    Behavior During Therapy WFL for tasks assessed/performed          Past Medical History:  Diagnosis Date   Acute H. pylori gastric ulcer    Coffee ground emesis    Diabetes mellitus (HCC)    Diabetic gastroparesis (HCC)    DKA (diabetic ketoacidosis) (HCC) 02/24/2021   Gastroparesis    GERD (gastroesophageal reflux disease)    Hypertension    Hyperthyroidism    Intractable nausea and vomiting 04/20/2021   Normocytic anemia 04/20/2020   Prolonged Q-T interval on ECG    Past Surgical History:  Procedure Laterality Date   AMPUTATION Bilateral 10/24/2022   Procedure: BILATERAL BELOW KNEE AMPUTATION;  Surgeon: Sage Jerona GAILS, MD;  Location: [facility] OR;  Service: Orthopedics;  Laterality: Bilateral;   AMPUTATION Bilateral 10/08/2022   Procedure: BILATERAL LEG DEBRIDEMENT;  Surgeon: Sage Jerona GAILS, MD;  Location: [facility] OR;  Service: Orthopedics;  Laterality: Bilateral;   AMPUTATION Bilateral 11/14/2022   Procedure: BILATERAL ABOVE KNEE AMPUTATION;  Surgeon: Sage Jerona GAILS, MD;  Location: [facility] OR;  Service: Orthopedics;  Laterality: Bilateral;   AMPUTATION TOE Left 03/10/2018   Procedure: AMPUTATION FIFTH TOE;  Surgeon: Gershon Donnice SAUNDERS, DPM;  Location: [facility];  Service: Podiatry;  Laterality: Left;   APPLICATION OF WOUND VAC Bilateral  10/12/2022   Procedure: APPLICATION OF WOUND VAC BILATERAL LEGS;  Surgeon: Celena Sharper, MD;  Location: [facility] OR;  Service: Orthopedics;  Laterality: Bilateral;   APPLICATION OF WOUND VAC Bilateral 10/24/2022   Procedure: APPLICATION OF WOUND VAC;  Surgeon: Sage Jerona GAILS, MD;  Location: [facility] OR;  Service: Orthopedics;  Laterality: Bilateral;   BIOPSY  01/28/2020   Procedure: BIOPSY;  Surgeon: Elicia Claw, MD;  Location: [facility];  Service: Gastroenterology;;   BOTOX  INJECTION  08/26/2021   Procedure: BOTOX  INJECTION;  Surgeon: Legrand Victory LITTIE DOUGLAS, MD;  Location: [facility];  Service: Gastroenterology;;   ESOPHAGOGASTRODUODENOSCOPY N/A 01/28/2020   Procedure: ESOPHAGOGASTRODUODENOSCOPY (EGD);  Surgeon: Elicia Claw, MD;  Location: [facility];  Service: Gastroenterology;  Laterality: N/A;   ESOPHAGOGASTRODUODENOSCOPY (EGD) WITH PROPOFOL  Left 09/08/2015   Procedure: ESOPHAGOGASTRODUODENOSCOPY (EGD) WITH PROPOFOL ;  Surgeon: Elsie Cree, MD;  Location: [facility]COPY;  Service: Endoscopy;  Laterality: Left;   ESOPHAGOGASTRODUODENOSCOPY (EGD) WITH PROPOFOL  N/A 08/26/2021   Procedure: ESOPHAGOGASTRODUODENOSCOPY (EGD) WITH PROPOFOL ;  Surgeon: Legrand Victory LITTIE DOUGLAS, MD;  Location: [facility];  Service: Gastroenterology;  Laterality: N/A;   GASTRIC STIMULATOR IMPLANT SURGERY  06/2022   at [facility]   I & D EXTREMITY Bilateral 10/12/2022   Procedure: IRRIGATION AND  DEBRIDEMENT  BILATERAL LEGS;  Surgeon: Celena Sharper, MD;  Location: [facility] OR;  Service: Orthopedics;  Laterality: Bilateral;   I & D EXTREMITY Right 10/13/2022   Procedure: RIGHT  THIGH WOUND VAC EXCHANGE;  Surgeon: Kendal Franky SQUIBB, MD;  Location: [facility] OR;  Service: Orthopedics;  Laterality: Right;   I & D EXTREMITY Bilateral 10/17/2022   Procedure: BILATERAL THIGH AND LEG DEBRIDEMENT, PARTIAL BILATERAL FOOT AMPUTATION;  Surgeon: Harden Jerona GAILS, MD;  Location: [facility] OR;  Service: Orthopedics;  Laterality: Bilateral;   I & D EXTREMITY Bilateral  11/05/2022   Procedure: BILATERAL THIGH DEBRIDEMENT;  Surgeon: Harden Jerona GAILS, MD;  Location: [facility] OR;  Service: Orthopedics;  Laterality: Bilateral;   I & D EXTREMITY Bilateral 11/07/2022   Procedure: BILATERAL THIGH AND LEG DEBRIDEMENT;  Surgeon: Harden Jerona GAILS, MD;  Location: [facility] OR;  Service: Orthopedics;  Laterality: Bilateral;   I & D EXTREMITY Bilateral 11/12/2022   Procedure: BILATERAL LEG DEBRIDEMENTS;  Surgeon: Harden Jerona GAILS, MD;  Location: [facility] OR;  Service: Orthopedics;  Laterality: Bilateral;   IRRIGATION AND DEBRIDEMENT FOOT Bilateral 10/05/2022   Procedure: IRRIGATION AND DEBRIDEMENT FOOT;  Surgeon: Janit Thresa HERO, DPM;  Location: [facility];  Service: Orthopedics/Podiatry;  Laterality: Bilateral;   WISDOM TOOTH EXTRACTION     Patient Active Problem List   Diagnosis Date Noted   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   Hypokalemia 11/27/2022   Effusion, left knee 11/05/2022   MDD (major depressive disorder), recurrent episode, moderate (HCC) 10/16/2022   Necrotizing fasciitis of pelvic region and thigh (HCC) 10/08/2022   Necrotizing fasciitis of lower leg (HCC) 10/08/2022   Necrotizing fasciitis (HCC) 10/08/2022   Sepsis due to cellulitis (HCC) 10/03/2022   Cellulitis of lower extremity 10/03/2022   Multinodular goiter 06/18/2022   Malnutrition of moderate degree 04/18/2022   Intractable vomiting with nausea 04/17/2022   DKA (diabetic ketoacidosis) (HCC) 03/04/2022   Nausea vomiting and diarrhea 12/25/2021   Diabetic gastroparesis (HCC) 10/09/2021    Drug-seeking behavior 09/21/2021   Essential hypertension 08/25/2021   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) 08/25/2021   GERD without esophagitis 08/25/2021   Hematemesis    Prolonged Q-T interval on ECG    Nausea and vomiting 07/20/2021   Anemia of chronic disease 06/06/2021   Dehydration 06/04/2021   Hypomagnesemia 04/21/2021   Ulcerated, foot, left, with fat layer exposed (HCC) 04/20/2021   Uncontrolled type 1 diabetes mellitus with hyperglycemia, with long-term current use of insulin  (HCC) 12/18/2020   DM type 1 (diabetes mellitus, type 1) (HCC) 12/18/2020   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 11/09/2020   Diabetic retinopathy of both eyes associated with type 1 diabetes mellitus (HCC) 09/24/2020   Diabetic gastroparesis associated with type 1 diabetes mellitus (HCC) 08/12/2020   Acute kidney injury superimposed on chronic kidney disease 07/25/2020   Generalized abdominal pain 07/23/2020   GERD (gastroesophageal reflux disease) 07/23/2020   Diabetic nephropathy associated with type 1 diabetes mellitus (HCC) 06/27/2020   Sepsis (HCC) 05/27/2020   HLD (hyperlipidemia) 05/23/2020   Sinus tachycardia 05/09/2020   Anemia 04/20/2020   Depression with anxiety 07/05/2019   Diabetic polyneuropathy associated with type 1 diabetes mellitus (HCC) 09/24/2017   Gastroparesis    Goiter 10/05/2009  DM2 (diabetes mellitus, type 2) (HCC) 10/05/2009   Hypertension associated with diabetes (HCC) 10/05/2009    ONSET DATE: 11/02/23 referral   REFERRING DIAG: [phone] (ICD-10-CM) - S/P AKA (above knee amputation) bilateral (HCC)   THERAPY DIAG:  Muscle weakness (generalized)  Other abnormalities of gait and mobility  Abnormal posture  Difficulty in walking, not elsewhere classified  Rationale for Evaluation and Treatment: Rehabilitation  SUBJECTIVE:                                                                                                                                                                                              SUBJECTIVE STATEMENT: [place], [place]hetics donned. Denies falls.  Pt accompanied by: significant other  PERTINENT HISTORY: DM, diabetic gastroparesis, GERD, HTN, hyperthyroidism, normocytic anemia, B AKA  PAIN:  Are you having pain? No - reports pain in left groin region w/ pressure from socket  PRECAUTIONS: Fall   PATIENT GOALS: to learn how to walk with new prosthetics                                                                                                                             TREATMENT  Theract: -removed L 5-ply sock for improved fit  -sit <> stand in // bars with grossly ModA due to poor knee control of MPKs -lateral weight shifts in // bars with progressively less UE support   Gait: -// bars x49ft with CGA  -progressed to overground gait with BWS harness for fall precaution, using Bari walker d/t step width given no knee flexion through swing phase  -x59ft   PATIENT EDUCATION: Education details: Continue HEP, date/time for CPO to come to clinic Person educated: Patient and SO Education method: Medical Illustrator Education comprehension: verbalized understanding and needs further education  HOME EXERCISE PROGRAM: -practice donning/doffing prosthetics as much as possible   GOALS: Goals reviewed with patient? Yes  SHORT TERM GOALS: Target date: 12/25/23  Pt will be independent with initial HEP for improved functional mobility and independence with new prosthetics  Baseline: to be  provided Goal status: INITIAL  2.  Patient will complete sit <> stand with LRAD and no more than MinA consistently  Baseline: up to MaxA in // bars Goal status: INITIAL  3.  Patient will initiate ambulation with an ambulatory device with assist as needed Baseline: only able to ambulate in // bars Goal status: REVISED  4.  Patient will be able to  don/doff B prosthetics with no more than MinA  Baseline: MaxA  Goal status: INITIAL  LONG TERM GOALS: Target date: 02/19/24  Pt will be independent with final HEP for improved functional mobility and independence with new prosthetics  Baseline: to be provided Goal status: INITIAL  2.  Patient will complete sit <> stand with LRAD with no more than supervision consistently  Baseline: MaxA in // bars  Goal status: INITIAL  3.  Patient will ambulate >/=33ft with ambulatory device and no more than MinA Baseline: unable-only ambulating in // bars Goal status: REVISED  4.  Patient will traverse at least 1 community obstacle (curb vs ramp) with LRAD and no more than CGA Baseline: unable on eval  Goal status: INITIAL  5.  Patient will don/doff B prosthetics independently  Baseline: MaxA Goal status: INITIAL   ASSESSMENT:  CLINICAL IMPRESSION: Patient seen for skilled PT session with emphasis on progressing gait training with B MPKs. PT and patient continue to be unable to elicit appropriate knee flexion in B knees through gait and most often with sitting as well. Discussed that prosthetist plans to attend session Monday 11/3 at 930am to assess this. She was able to progress to gait outside the // bars with [employer] BWS harness as fall precaution. She has to use a bariatric walker given step width as B knees do not flex and she must circumduct to advance them. Continue POC.   OBJECTIVE IMPAIRMENTS: Abnormal gait, cardiopulmonary status limiting activity, decreased activity tolerance, decreased balance, decreased endurance, decreased knowledge of use of DME, decreased mobility, difficulty walking, decreased strength, impaired vision/preception, and prosthetic dependency .   ACTIVITY LIMITATIONS: carrying, lifting, bending, standing, squatting, stairs, locomotion level, and caring for others  PARTICIPATION LIMITATIONS: meal prep, cleaning, interpersonal relationship, driving, shopping, community  activity, and occupation  PERSONAL FACTORS: Age, Behavior pattern, Fitness, Past/current experiences, Social background, Time since onset of injury/illness/exacerbation, Transportation, and 3+ comorbidities: see above are also affecting patient's functional outcome.   REHAB POTENTIAL: Fair complexity of comorbidities   CLINICAL DECISION MAKING: Evolving/moderate complexity  EVALUATION COMPLEXITY: Moderate  PLAN:  PT FREQUENCY: 2x/week  PT DURATION: 12 weeks  PLANNED INTERVENTIONS: [id number]- PT Re-evaluation, 97750- Physical Performance Testing, 97110-Therapeutic exercises, 97530- Therapeutic activity, [id number]- Neuromuscular re-education, 97535- Self Care, [id number]- Manual therapy, [id number]- Gait training, [id number]- Orthotic Initial, [id number]- Prosthetic Initial , [phone]- Orthotic/Prosthetic subsequent, [phone]- Canalith repositioning, [id number]- Aquatic Therapy, [phone] (1-2 muscles), [id number] (3+ muscles)- Dry Needling, Patient/Family education, Balance training, Stair training, Vestibular training, Visual/preceptual remediation/compensation, DME instructions, Wheelchair mobility training, Cryotherapy, and Moist heat  PLAN FOR NEXT SESSION: sink HEP?, adjust knee resistance in app PRN, stand from higher mat to walker and attempt gait with bari walker. Did great with BWS harness as fall precaution! (Just cumbersome)    [name], PT [name], PT, DPT, CBIS  12/17/2023, 2:20 PM

## 2023-12-17 NOTE — Therapy (Signed)
 [facility] [address] [place], [place], [address] Phone: [phone]   Fax:  [phone]  Patient Details  Name: Kelli Hudson MRN: [id number] Date of Birth: [birth year] Referring Provider:  No ref. provider found  Encounter Date: 12/17/2023  PT spoke to [facility] in [place] this morning at 1045. Hadassah confirmed that prosthetist, Clint, will be able to come to patients appointment 11/3 at 930am to assess her B MPKs as we still have not been able to trigger knee flexion through gait.   [name], PT [name], PT, DPT, CBIS  12/17/2023, 10:53 AM  [facility] [address] [place], [place], [address] Phone: [phone]   Fax:  [phone]

## 2023-12-21 ENCOUNTER — Encounter: Admitting: Physical Therapy

## 2023-12-21 ENCOUNTER — Encounter: Payer: Self-pay | Admitting: Radiology

## 2023-12-21 ENCOUNTER — Ambulatory Visit: Attending: Orthopedic Surgery

## 2023-12-21 DIAGNOSIS — R293 Abnormal posture: Secondary | ICD-10-CM | POA: Insufficient documentation

## 2023-12-21 DIAGNOSIS — R2689 Other abnormalities of gait and mobility: Secondary | ICD-10-CM | POA: Insufficient documentation

## 2023-12-21 DIAGNOSIS — M6281 Muscle weakness (generalized): Secondary | ICD-10-CM | POA: Diagnosis present

## 2023-12-21 DIAGNOSIS — R29898 Other symptoms and signs involving the musculoskeletal system: Secondary | ICD-10-CM | POA: Insufficient documentation

## 2023-12-21 DIAGNOSIS — R262 Difficulty in walking, not elsewhere classified: Secondary | ICD-10-CM | POA: Insufficient documentation

## 2023-12-21 NOTE — Therapy (Signed)
 OUTPATIENT PHYSICAL THERAPY NEURO TREATMENT   Patient Name: Kelli Hudson MRN: [id number] DOB:1990/05/03, 33 y.o., female Today's Date: 12/21/2023   PCP: Daphne Lesches, NP REFERRING PROVIDER: Jerona Sage, MD  END OF SESSION:  PT End of Session - 12/21/23 0930     Visit Number 7    Number of Visits 25    Date for Recertification  02/19/24    Authorization Type [employer] dual    Progress Note Due on Visit 10    PT Start Time 0931    PT Stop Time 1017    PT Time Calculation (min) 46 min    Equipment Utilized During Treatment Gait belt    Activity Tolerance Patient tolerated treatment well    Behavior During Therapy WFL for tasks assessed/performed          Past Medical History:  Diagnosis Date   Acute H. pylori gastric ulcer    Coffee ground emesis    Diabetes mellitus (HCC)    Diabetic gastroparesis (HCC)    DKA (diabetic ketoacidosis) (HCC) 02/24/2021   Gastroparesis    GERD (gastroesophageal reflux disease)    Hypertension    Hyperthyroidism    Intractable nausea and vomiting 04/20/2021   Normocytic anemia 04/20/2020   Prolonged Q-T interval on ECG    Past Surgical History:  Procedure Laterality Date   AMPUTATION Bilateral 10/24/2022   Procedure: BILATERAL BELOW KNEE AMPUTATION;  Surgeon: Sage Jerona GAILS, MD;  Location: [facility] OR;  Service: Orthopedics;  Laterality: Bilateral;   AMPUTATION Bilateral 10/08/2022   Procedure: BILATERAL LEG DEBRIDEMENT;  Surgeon: Sage Jerona GAILS, MD;  Location[facility] OR;  Service: Orthopedics;  Laterality: Bilateral;   AMPUTATION Bilateral 11/14/2022   Procedure: BILATERAL ABOVE KNEE AMPUTATION;  Surgeon: Sage Jerona GAILS, MD;  Location: [facility] OR;  Service: Orthopedics;  Laterality: Bilateral;   AMPUTATION TOE Left 03/10/2018   Procedure: AMPUTATION FIFTH TOE;  Surgeon: Gershon Donnice SAUNDERS, DPM;  Location: [facility];  Service: Podiatry;  Laterality: Left;   APPLICATION OF WOUND VAC Bilateral 10/12/2022   Procedure:  APPLICATION OF WOUND VAC BILATERAL LEGS;  Surgeon: Celena Sharper, MD;  Location: [facility] OR;  Service: Orthopedics;  Laterality: Bilateral;   APPLICATION OF WOUND VAC Bilateral 10/24/2022   Procedure: APPLICATION OF WOUND VAC;  Surgeon: Sage Jerona GAILS, MD;  Location: [facility] OR;  Service: Orthopedics;  Laterality: Bilateral;   BIOPSY  01/28/2020   Procedure: BIOPSY;  Surgeon: Elicia Claw, MD;  Location: [facility];  Service: Gastroenterology;;   BOTOX  INJECTION  08/26/2021   Procedure: BOTOX  INJECTION;  Surgeon: Legrand Victory LITTIE DOUGLAS, MD;  Location: [facility];  Service: Gastroenterology;;   ESOPHAGOGASTRODUODENOSCOPY N/A 01/28/2020   Procedure: ESOPHAGOGASTRODUODENOSCOPY (EGD);  Surgeon: Elicia Claw, MD;  Location: [facility];  Service: Gastroenterology;  Laterality: N/A;   ESOPHAGOGASTRODUODENOSCOPY (EGD) WITH PROPOFOL  Left 09/08/2015   Procedure: ESOPHAGOGASTRODUODENOSCOPY (EGD) WITH PROPOFOL ;  Surgeon: Elsie Cree, MD;  Location: [facility] END[facility]COPY;  Service: Endoscopy;  Laterality: Left;   ESOPHAGOGASTRODUODENOSCOPY (EGD) WITH PROPOFOL  N/A 08/26/2021   Procedure: ESOPHAGOGASTRODUODENOSCOPY (EGD) WITH PROPOFOL ;  Surgeon: Legrand Victory LITTIE DOUGLAS, MD;  Location: [facility];  Service: Gastroenterology;  Laterality: N/A;   GASTRIC STIMULATOR IMPLANT SURGERY  06/2022   at [facility]   I & D EXTREMITY Bilateral 10/12/2022   Procedure: IRRIGATION AND  DEBRIDEMENT  BILATERAL LEGS;  Surgeon: Celena Sharper, MD;  Location: [facility] OR;  Service: Orthopedics;  Laterality: Bilateral;   I & D EXTREMITY Right 10/13/2022   Procedure: RIGHT THIGH WOUND VAC EXCHANGE;  Surgeon: Kendal Franky SQUIBB, MD;  Location: [facility] OR;  Service: Orthopedics;  Laterality: Right;   I & D EXTREMITY Bilateral 10/17/2022   Procedure: BILATERAL THIGH AND LEG DEBRIDEMENT, PARTIAL BILATERAL FOOT AMPUTATION;  Surgeon: Harden Jerona GAILS, MD;  Location: [facility] OR;  Service: Orthopedics;  Laterality: Bilateral;   I & D EXTREMITY Bilateral 11/05/2022   Procedure:  BILATERAL THIGH DEBRIDEMENT;  Surgeon: Harden Jerona GAILS, MD;  Location: [facility] OR;  Service: Orthopedics;  Laterality: Bilateral;   I & D EXTREMITY Bilateral 11/07/2022   Procedure: BILATERAL THIGH AND LEG DEBRIDEMENT;  Surgeon: Harden Jerona GAILS, MD;  Location[facility] OR;  Service: Orthopedics;  Laterality: Bilateral;   I & D EXTREMITY Bilateral 11/12/2022   Procedure: BILATERAL LEG DEBRIDEMENTS;  Surgeon: Harden Jerona GAILS, MD;  Location: [facility] OR;  Service: Orthopedics;  Laterality: Bilateral;   IRRIGATION AND DEBRIDEMENT FOOT Bilateral 10/05/2022   Procedure: IRRIGATION AND DEBRIDEMENT FOOT;  Surgeon: Janit Thresa HERO, DPM;  Location: [facility];  Service: Orthopedics/Podiatry;  Laterality: Bilateral;   WISDOM TOOTH EXTRACTION     Patient Active Problem List   Diagnosis Date Noted   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   Hypokalemia 11/27/2022   Effusion, left knee 11/05/2022   MDD (major depressive disorder), recurrent episode, moderate (HCC) 10/16/2022   Necrotizing fasciitis of pelvic region and thigh (HCC) 10/08/2022   Necrotizing fasciitis of lower leg (HCC) 10/08/2022   Necrotizing fasciitis (HCC) 10/08/2022   Sepsis due to cellulitis (HCC) 10/03/2022   Cellulitis of lower extremity 10/03/2022   Multinodular goiter 06/18/2022   Malnutrition of moderate degree 04/18/2022   Intractable vomiting with nausea 04/17/2022   DKA (diabetic ketoacidosis) (HCC) 03/04/2022   Nausea vomiting and diarrhea 12/25/2021   Diabetic gastroparesis (HCC) 10/09/2021   Drug-seeking behavior  09/21/2021   Essential hypertension 08/25/2021   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) 08/25/2021   GERD without esophagitis 08/25/2021   Hematemesis    Prolonged Q-T interval on ECG    Nausea and vomiting 07/20/2021   Anemia of chronic disease 06/06/2021   Dehydration 06/04/2021   Hypomagnesemia 04/21/2021   Ulcerated, foot, left, with fat layer exposed (HCC) 04/20/2021   Uncontrolled type 1 diabetes mellitus with hyperglycemia, with long-term current use of insulin  (HCC) 12/18/2020   DM type 1 (diabetes mellitus, type 1) (HCC) 12/18/2020   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 11/09/2020   Diabetic retinopathy of both eyes associated with type 1 diabetes mellitus (HCC) 09/24/2020   Diabetic gastroparesis associated with type 1 diabetes mellitus (HCC) 08/12/2020   Acute kidney injury superimposed on chronic kidney disease 07/25/2020   Generalized abdominal pain 07/23/2020   GERD (gastroesophageal reflux disease) 07/23/2020   Diabetic nephropathy associated with type 1 diabetes mellitus (HCC) 06/27/2020   Sepsis (HCC) 05/27/2020   HLD (hyperlipidemia) 05/23/2020   Sinus tachycardia 05/09/2020   Anemia 04/20/2020   Depression with anxiety 07/05/2019   Diabetic polyneuropathy associated with type 1 diabetes mellitus (HCC) 09/24/2017   Gastroparesis    Goiter 10/05/2009   DM2 (diabetes mellitus, type  2) (HCC) 10/05/2009   Hypertension associated with diabetes (HCC) 10/05/2009    ONSET DATE: 11/02/23 referral   REFERRING DIAG: [phone] (ICD-10-CM) - S/P AKA (above knee amputation) bilateral (HCC)   THERAPY DIAG:  Muscle weakness (generalized)  Other abnormalities of gait and mobility  Abnormal posture  Difficulty in walking, not elsewhere classified  Rationale for Evaluation and Treatment: Rehabilitation  SUBJECTIVE:                                                                                                                                                                                              SUBJECTIVE STATEMENT: [place], [place]hetics donned. Denies falls. Clint from [employer] present to problem solve B knees.   Pt accompanied by: significant other  PERTINENT HISTORY: DM, diabetic gastroparesis, GERD, HTN, hyperthyroidism, normocytic anemia, B AKA  PAIN:  Are you having pain? No - reports pain in left groin region w/ pressure from socket  PRECAUTIONS: Fall   PATIENT GOALS: to learn how to walk with new prosthetics                                                                                                                             TREATMENT  Gait:  -prosthetist present to change B knees into B+ mode to allow for B knees to bend in terminal stance/pre-swing   -x2 laps in // bars in this mode with CGA  -prosthetist advanced patient to C mode to allow for more natural swing   -x2 laps in // bars in this mode with CGA -retro gait x4 laps with MinA  -sit <> stand grossly CGA throughout with appropriate knee flexion    PATIENT EDUCATION: Education details: Continue HEP, adjustments made to B MPKs Person educated: Patient and SO Education method: Medical Illustrator Education comprehension: verbalized understanding and needs further education  HOME EXERCISE PROGRAM: -practice donning/doffing prosthetics as much as possible   GOALS: Goals reviewed with patient? Yes  SHORT TERM GOALS: Target date: 12/25/23  Pt will be independent with initial HEP for improved functional  mobility and independence with new prosthetics  Baseline: to be provided Goal status: INITIAL  2.  Patient will complete sit <> stand with LRAD and no more than MinA consistently  Baseline: up to MaxA in // bars Goal status: INITIAL  3.  Patient will initiate ambulation with an ambulatory device with assist as needed Baseline: only able to ambulate in // bars Goal status: REVISED  4.  Patient  will be able to don/doff B prosthetics with no more than MinA  Baseline: MaxA  Goal status: INITIAL  LONG TERM GOALS: Target date: 02/19/24  Pt will be independent with final HEP for improved functional mobility and independence with new prosthetics  Baseline: to be provided Goal status: INITIAL  2.  Patient will complete sit <> stand with LRAD with no more than supervision consistently  Baseline: MaxA in // bars  Goal status: INITIAL  3.  Patient will ambulate >/=1ft with ambulatory device and no more than MinA Baseline: unable-only ambulating in // bars Goal status: REVISED  4.  Patient will traverse at least 1 community obstacle (curb vs ramp) with LRAD and no more than CGA Baseline: unable on eval  Goal status: INITIAL  5.  Patient will don/doff B prosthetics independently  Baseline: MaxA Goal status: INITIAL   ASSESSMENT:  CLINICAL IMPRESSION: Patient seen for skilled PT session with emphasis on gait training with B MPKs. Prosthetist present to advance mode of knees to allow for flexion during gait. Patient able to achieve more normalize step width and was able to appropriately trigger knee flexion through swing phase. Prosthetist did set knees to play a tone when all criteria were met for knee flexion. Continue POC.   OBJECTIVE IMPAIRMENTS: Abnormal gait, cardiopulmonary status limiting activity, decreased activity tolerance, decreased balance, decreased endurance, decreased knowledge of use of DME, decreased mobility, difficulty walking, decreased strength, impaired vision/preception, and prosthetic dependency .   ACTIVITY LIMITATIONS: carrying, lifting, bending, standing, squatting, stairs, locomotion level, and caring for others  PARTICIPATION LIMITATIONS: meal prep, cleaning, interpersonal relationship, driving, shopping, community activity, and occupation  PERSONAL FACTORS: Age, Behavior pattern, Fitness, Past/current experiences, Social background, Time since onset of  injury/illness/exacerbation, Transportation, and 3+ comorbidities: see above are also affecting patient's functional outcome.   REHAB POTENTIAL: Fair complexity of comorbidities   CLINICAL DECISION MAKING: Evolving/moderate complexity  EVALUATION COMPLEXITY: Moderate  PLAN:  PT FREQUENCY: 2x/week  PT DURATION: 12 weeks  PLANNED INTERVENTIONS: [id number]- PT Re-evaluation, 97750- Physical Performance Testing, 97110-Therapeutic exercises, 97530- Therapeutic activity, [id number]- Neuromuscular re-education, 97535- Self Care, [id number]- Manual therapy, [id number]- Gait training, [id number]- Orthotic Initial, [id number]- Prosthetic Initial , [phone]- Orthotic/Prosthetic subsequent, [phone]- Canalith repositioning, [id number]- Aquatic Therapy, [phone] (1-2 muscles), [id number] (3+ muscles)- Dry Needling, Patient/Family education, Balance training, Stair training, Vestibular training, Visual/preceptual remediation/compensation, DME instructions, Wheelchair mobility training, Cryotherapy, and Moist heat  PLAN FOR NEXT SESSION: sink HEP?, adjust knee resistance in app PRN, stand from higher mat to walker and attempt gait with bari walker. Did great with BWS harness as fall precaution! (Just cumbersome)    [name], PT [name], PT, DPT, CBIS  12/21/2023, 10:49 AM

## 2023-12-24 ENCOUNTER — Ambulatory Visit

## 2023-12-24 ENCOUNTER — Other Ambulatory Visit: Payer: Self-pay | Admitting: *Deleted

## 2023-12-24 DIAGNOSIS — M6281 Muscle weakness (generalized): Secondary | ICD-10-CM | POA: Diagnosis not present

## 2023-12-24 DIAGNOSIS — N186 End stage renal disease: Secondary | ICD-10-CM

## 2023-12-24 DIAGNOSIS — R2689 Other abnormalities of gait and mobility: Secondary | ICD-10-CM

## 2023-12-24 DIAGNOSIS — R293 Abnormal posture: Secondary | ICD-10-CM

## 2023-12-24 DIAGNOSIS — R262 Difficulty in walking, not elsewhere classified: Secondary | ICD-10-CM

## 2023-12-24 NOTE — Therapy (Cosign Needed)
 OUTPATIENT PHYSICAL THERAPY NEURO TREATMENT   Patient Name: Kelli Hudson MRN: [id number] DOB:[birth year], 33 y.o., female Today's Date: 12/25/2023   PCP: Daphne Lesches, NP REFERRING PROVIDER: Jerona Sage, MD  END OF SESSION:  PT End of Session - 12/24/23 1539     Visit Number 8    Number of Visits 25    Date for Recertification  02/19/24    Authorization Type [employer] dual    Progress Note Due on Visit 10    PT Start Time 1147    PT Stop Time 1232    PT Time Calculation (min) 45 min    Equipment Utilized During Treatment Gait belt    Activity Tolerance Patient tolerated treatment well    Behavior During Therapy WFL for tasks assessed/performed           Past Medical History:  Diagnosis Date   Acute H. pylori gastric ulcer    Coffee ground emesis    Diabetes mellitus (HCC)    Diabetic gastroparesis (HCC)    DKA (diabetic ketoacidosis) (HCC) 02/24/2021   Gastroparesis    GERD (gastroesophageal reflux disease)    Hypertension    Hyperthyroidism    Intractable nausea and vomiting 04/20/2021   Normocytic anemia 04/20/2020   Prolonged Q-T interval on ECG    Past Surgical History:  Procedure Laterality Date   AMPUTATION Bilateral 10/24/2022   Procedure: BILATERAL BELOW KNEE AMPUTATION;  Surgeon: Sage Jerona GAILS, MD;  Location: [facility] OR;  Service: Orthopedics;  Laterality: Bilateral;   AMPUTATION Bilateral 10/08/2022   Procedure: BILATERAL LEG DEBRIDEMENT;  Surgeon: Sage Jerona GAILS, MD;  Location: [facility] OR;  Service: Orthopedics;  Laterality: Bilateral;   AMPUTATION Bilateral 11/14/2022   Procedure: BILATERAL ABOVE KNEE AMPUTATION;  Surgeon: Sage Jerona GAILS, MD;  Location: [facility] OR;  Service: Orthopedics;  Laterality: Bilateral;   AMPUTATION TOE Left 03/10/2018   Procedure: AMPUTATION FIFTH TOE;  Surgeon: Gershon Donnice SAUNDERS, DPM;  Location: [facility];  Service: Podiatry;  Laterality: Left;   APPLICATION OF WOUND VAC Bilateral 10/12/2022   Procedure:  APPLICATION OF WOUND VAC BILATERAL LEGS;  Surgeon: Celena Sharper, MD;  Location: [facility] OR;  Service: Orthopedics;  Laterality: Bilateral;   APPLICATION OF WOUND VAC Bilateral 10/24/2022   Procedure: APPLICATION OF WOUND VAC;  Surgeon: Sage Jerona GAILS, MD;  Location: [facility] OR;  Service: Orthopedics;  Laterality: Bilateral;   BIOPSY  01/28/2020   Procedure: BIOPSY;  Surgeon: Elicia Claw, MD;  Location: [facility];  Service: Gastroenterology;;   BOTOX  INJECTION  08/26/2021   Procedure: BOTOX  INJECTION;  Surgeon: Legrand Victory LITTIE DOUGLAS, MD;  Location: [facility];  Service: Gastroenterology;;   ESOPHAGOGASTRODUODENOSCOPY N/A 01/28/2020   Procedure: ESOPHAGOGASTRODUODENOSCOPY (EGD);  Surgeon: Elicia Claw, MD;  Location: [facility];  Service: Gastroenterology;  Laterality: N/A;   ESOPHAGOGASTRODUODENOSCOPY (EGD) WITH PROPOFOL  Left 09/08/2015   Procedure: ESOPHAGOGASTRODUODENOSCOPY (EGD) WITH PROPOFOL ;  Surgeon: Elsie Cree, MD;  Location: [facility] ENDOSCOPY;  Service: Endoscopy;  Laterality: Left;   ESOPHAGOGASTRODUODENOSCOPY (EGD) WITH PROPOFOL  N/A 08/26/2021   Procedure: ESOPHAGOGASTRODUODENOSCOPY (EGD) WITH PROPOFOL ;  Surgeon: Legrand Victory LITTIE DOUGLAS, MD;  Location: [facility];  Service: Gastroenterology;  Laterality: N/A;   GASTRIC STIMULATOR IMPLANT SURGERY  06/2022   at [facility]   I & D EXTREMITY Bilateral 10/12/2022   Procedure: IRRIGATION AND  DEBRIDEMENT  BILATERAL LEGS;  Surgeon: Celena Sharper, MD;  Location: [facility] OR;  Service: Orthopedics;  Laterality: Bilateral;   I & D EXTREMITY Right 10/13/2022   Procedure: RIGHT THIGH WOUND VAC  EXCHANGE;  Surgeon: Kendal Franky SQUIBB, MD;  Location: [facility] OR;  Service: Orthopedics;  Laterality: Right;   I & D EXTREMITY Bilateral 10/17/2022   Procedure: BILATERAL THIGH AND LEG DEBRIDEMENT, PARTIAL BILATERAL FOOT AMPUTATION;  Surgeon: Harden Jerona GAILS, MD;  Location: [facility] OR;  Service: Orthopedics;  Laterality: Bilateral;   I & D EXTREMITY Bilateral 11/05/2022   Procedure:  BILATERAL THIGH DEBRIDEMENT;  Surgeon: Harden Jerona GAILS, MD;  Location: [facility] OR;  Service: Orthopedics;  Laterality: Bilateral;   I & D EXTREMITY Bilateral 11/07/2022   Procedure: BILATERAL THIGH AND LEG DEBRIDEMENT;  Surgeon: Harden Jerona GAILS, MD;  Location: [facility] OR;  Service: Orthopedics;  Laterality: Bilateral;   I & D EXTREMITY Bilateral 11/12/2022   Procedure: BILATERAL LEG DEBRIDEMENTS;  Surgeon: Harden Jerona GAILS, MD;  Location: [facility] OR;  Service: Orthopedics;  Laterality: Bilateral;   IRRIGATION AND DEBRIDEMENT FOOT Bilateral 10/05/2022   Procedure: IRRIGATION AND DEBRIDEMENT FOOT;  Surgeon: Janit Thresa HERO, DPM;  Location: [facility];  Service: Orthopedics/Podiatry;  Laterality: Bilateral;   WISDOM TOOTH EXTRACTION     Patient Active Problem List   Diagnosis Date Noted   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   Hypokalemia 11/27/2022   Effusion, left knee 11/05/2022   MDD (major depressive disorder), recurrent episode, moderate (HCC) 10/16/2022   Necrotizing fasciitis of pelvic region and thigh (HCC) 10/08/2022   Necrotizing fasciitis of lower leg (HCC) 10/08/2022   Necrotizing fasciitis (HCC) 10/08/2022   Sepsis due to cellulitis (HCC) 10/03/2022   Cellulitis of lower extremity 10/03/2022   Multinodular goiter 06/18/2022   Malnutrition of moderate degree 04/18/2022   Intractable vomiting with nausea 04/17/2022   DKA (diabetic ketoacidosis) (HCC) 03/04/2022   Nausea vomiting and diarrhea 12/25/2021   Diabetic gastroparesis (HCC) 10/09/2021   Drug-seeking behavior  09/21/2021   Essential hypertension 08/25/2021   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) 08/25/2021   GERD without esophagitis 08/25/2021   Hematemesis    Prolonged Q-T interval on ECG    Nausea and vomiting 07/20/2021   Anemia of chronic disease 06/06/2021   Dehydration 06/04/2021   Hypomagnesemia 04/21/2021   Ulcerated, foot, left, with fat layer exposed (HCC) 04/20/2021   Uncontrolled type 1 diabetes mellitus with hyperglycemia, with long-term current use of insulin  (HCC) 12/18/2020   DM type 1 (diabetes mellitus, type 1) (HCC) 12/18/2020   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 11/09/2020   Diabetic retinopathy of both eyes associated with type 1 diabetes mellitus (HCC) 09/24/2020   Diabetic gastroparesis associated with type 1 diabetes mellitus (HCC) 08/12/2020   Acute kidney injury superimposed on chronic kidney disease 07/25/2020   Generalized abdominal pain 07/23/2020   GERD (gastroesophageal reflux disease) 07/23/2020   Diabetic nephropathy associated with type 1 diabetes mellitus (HCC) 06/27/2020   Sepsis (HCC) 05/27/2020   HLD (hyperlipidemia) 05/23/2020   Sinus tachycardia 05/09/2020   Anemia 04/20/2020   Depression with anxiety 07/05/2019   Diabetic polyneuropathy associated with type 1 diabetes mellitus (HCC) 09/24/2017   Gastroparesis    Goiter 10/05/2009   DM2 (diabetes  mellitus, type 2) (HCC) 10/05/2009   Hypertension associated with diabetes (HCC) 10/05/2009    ONSET DATE: 11/02/23 referral   REFERRING DIAG: [phone] (ICD-10-CM) - S/P AKA (above knee amputation) bilateral (HCC)   THERAPY DIAG:  Muscle weakness (generalized)  Other abnormalities of gait and mobility  Abnormal posture  Difficulty in walking, not elsewhere classified  Rationale for Evaluation and Treatment: Rehabilitation  SUBJECTIVE:                                                                                                                                                                                              SUBJECTIVE STATEMENT: Patient arrives to clinic in [facility] accompanied by significant other, Drew. Prosthetics were donned. Patient denies falls.   Pt accompanied by: significant other  PERTINENT HISTORY: DM, diabetic gastroparesis, GERD, HTN, hyperthyroidism, normocytic anemia, B AKA  PAIN:  Are you having pain? No - reports pain in left groin region w/ pressure from socket  PRECAUTIONS: Fall   PATIENT GOALS: to learn how to walk with new prosthetics                                                                                                                             TREATMENT  Gait:  - In parallel bars and C mode: - 2 laps forward and retrograde gait - CGA with mod assist x 2 for sit to stand, minimal assist during ambulation, cueing for toe push off in terminal stance to trigger knee flexion, cueing for less hip flexion in order to keep knees from flexing prematurely - Overground training forward only, with front wheeled rolling walking x 15 feet - CGA with mod assist x 2 for sit to stand, min assist during ambulation, knee extension assist to standing and appropriate knee flexion during toe off in terminal stance   Therapeutic Activity: - Patient had episode of low blood sugar with beep alert from pt's phone. Patient states she is familiar with beep tones and what they mean but was suggested that she ensure notifications are also sent to her significant other and/or  family member due to decreased vision and status of living alone. - Blood glucose from monitor at end of session: 71 mg/dL - Patient was then provided 8 ounce coke, nibo crackers and hard candies - After a few minutes monitor read 69 mg/dL and stayed at this level. Exercise was terminated and patient continued to consume crackers and sugary drink. Patient mentioned feeling her heart beating and states this happens when blood sugar is low and no other  symptoms reported by patient. - Patient left clinic in [facility] (propelling WC herself) with significant other by her side, fully coherent and no other symptoms reported. - Patient and significant other stated they will go get more food and juice or sweet tea from [employer] as this helps her. - Education on eating substantial breakfast/meal to support exercise and pt agrees and states that she only had protein before coming to session today  PATIENT EDUCATION: Education details: Continue HEP, education on eating something more filling before exercise as patient is moving and exercising more. Person educated: Patient and SO Education method: Medical Illustrator Education comprehension: verbalized understanding and needs further education  HOME EXERCISE PROGRAM: -practice donning/doffing prosthetics as much as possible   GOALS: Goals reviewed with patient? Yes  SHORT TERM GOALS: Target date: 12/25/23  Pt will be independent with initial HEP for improved functional mobility and independence with new prosthetics  Baseline: to be provided Goal status: INITIAL  2.  Patient will complete sit <> stand with LRAD and no more than MinA consistently  Baseline: up to MaxA in // bars Goal status: INITIAL  3.  Patient will initiate ambulation with an ambulatory device with assist as needed Baseline: only able to ambulate in // bars Goal status: REVISED  4.  Patient will be able to don/doff B prosthetics with no more than MinA  Baseline: MaxA  Goal status: INITIAL  LONG TERM GOALS: Target date: 02/19/24  Pt will be independent with final HEP for improved functional mobility and independence with new prosthetics  Baseline: to be provided Goal status: INITIAL  2.  Patient will complete sit <> stand with LRAD with no more than supervision consistently  Baseline: MaxA in // bars  Goal status: INITIAL  3.  Patient will ambulate >/=33ft with ambulatory device and no more than MinA Baseline:  unable-only ambulating in // bars Goal status: REVISED  4.  Patient will traverse at least 1 community obstacle (curb vs ramp) with LRAD and no more than CGA Baseline: unable on eval  Goal status: INITIAL  5.  Patient will don/doff B prosthetics independently  Baseline: MaxA Goal status: INITIAL   ASSESSMENT:  CLINICAL IMPRESSION: Patient seen for skilled physical therapy today with emphasis on gait training with bilateral mechanical prosthetic knees. Session began with sit to stand and weight shifting in parallel bars. Advanced to gait training in parallel bars both forward and retrograde. Session then proceeded to gait training outside of parallel bars with front wheeled rolling walker. Patient continues to need moderate assistance with sit to stand and advancing gait with minimal assist and some guidance with manual knee extension when standing. With less resistance of knee flexion of B MPK, there is a need to ensure and assist with knee extension during standing transfer. Patient continues to improve step width and was able to appropriately trigger knee flexion through swing phase with toe push off in terminal stance. Continue POC.   OBJECTIVE IMPAIRMENTS: Abnormal gait, cardiopulmonary status limiting activity, decreased activity tolerance, decreased balance, decreased  endurance, decreased knowledge of use of DME, decreased mobility, difficulty walking, decreased strength, impaired vision/preception, and prosthetic dependency .   ACTIVITY LIMITATIONS: carrying, lifting, bending, standing, squatting, stairs, locomotion level, and caring for others  PARTICIPATION LIMITATIONS: meal prep, cleaning, interpersonal relationship, driving, shopping, community activity, and occupation  PERSONAL FACTORS: Age, Behavior pattern, Fitness, Past/current experiences, Social background, Time since onset of injury/illness/exacerbation, Transportation, and 3+ comorbidities: see above are also affecting  patient's functional outcome.   REHAB POTENTIAL: Fair complexity of comorbidities   CLINICAL DECISION MAKING: Evolving/moderate complexity  EVALUATION COMPLEXITY: Moderate  PLAN:  PT FREQUENCY: 2x/week  PT DURATION: 12 weeks  PLANNED INTERVENTIONS: [id number]- PT Re-evaluation, 97750- Physical Performance Testing, 97110-Therapeutic exercises, 97530- Therapeutic activity, [id number]- Neuromuscular re-education, 97535- Self Care, [id number]- Manual therapy, [id number]- Gait training, [id number]- Orthotic Initial, [id number]- Prosthetic Initial , [phone]- Orthotic/Prosthetic subsequent, [phone]- Canalith repositioning, [id number]- Aquatic Therapy, [phone] (1-2 muscles), [id number] (3+ muscles)- Dry Needling, Patient/Family education, Balance training, Stair training, Vestibular training, Visual/preceptual remediation/compensation, DME instructions, Wheelchair mobility training, Cryotherapy, and Moist heat  PLAN FOR NEXT SESSION: sink HEP?, adjust knee resistance in app PRN, stand from higher mat to walker and attempt gait with bari walker. Did great with BWS harness as fall precaution! (Just cumbersome)    Emmalene Sherry, Student-PT [name], PT, DPT, CBIS  12/25/2023, 9:18 AM

## 2023-12-25 ENCOUNTER — Encounter (HOSPITAL_COMMUNITY)
Admission: RE | Admit: 2023-12-25 | Discharge: 2023-12-25 | Disposition: A | Discharge status: Home / Self Care | Source: Ambulatory Visit | Attending: Nephrology | Admitting: Nephrology

## 2023-12-25 VITALS — BP 138/96 | HR 88 | Temp 97.4°F | Resp 16

## 2023-12-25 DIAGNOSIS — D631 Anemia in chronic kidney disease: Secondary | ICD-10-CM | POA: Diagnosis present

## 2023-12-25 DIAGNOSIS — N184 Chronic kidney disease, stage 4 (severe): Secondary | ICD-10-CM | POA: Insufficient documentation

## 2023-12-25 LAB — POCT HEMOGLOBIN-HEMACUE: Hemoglobin: 8.7 g/dL — ABNORMAL LOW (ref 12.0–15.0)

## 2023-12-25 MED ORDER — EPOETIN ALFA-EPBX 10000 UNIT/ML IJ SOLN
10000.0000 [IU] | Freq: Once | INTRAMUSCULAR | Status: AC
  Administered 2023-12-25: 10000 [IU] via SUBCUTANEOUS

## 2023-12-25 MED ORDER — EPOETIN ALFA-EPBX 10000 UNIT/ML IJ SOLN
INTRAMUSCULAR | Status: AC
  Filled 2023-12-25: qty 1

## 2023-12-28 ENCOUNTER — Encounter: Payer: Self-pay | Admitting: Physical Therapy

## 2023-12-28 ENCOUNTER — Ambulatory Visit: Admitting: Physical Therapy

## 2023-12-28 DIAGNOSIS — M6281 Muscle weakness (generalized): Secondary | ICD-10-CM

## 2023-12-28 DIAGNOSIS — R293 Abnormal posture: Secondary | ICD-10-CM

## 2023-12-28 DIAGNOSIS — R29898 Other symptoms and signs involving the musculoskeletal system: Secondary | ICD-10-CM

## 2023-12-28 DIAGNOSIS — R262 Difficulty in walking, not elsewhere classified: Secondary | ICD-10-CM

## 2023-12-28 DIAGNOSIS — R2689 Other abnormalities of gait and mobility: Secondary | ICD-10-CM

## 2023-12-28 NOTE — Therapy (Unsigned)
 OUTPATIENT PHYSICAL THERAPY NEURO TREATMENT   Patient Name: Kelli Hudson MRN: [id number] DOB:December 19, 1990, 33 y.o., female Today's Date: 12/28/2023   PCP: Daphne Lesches, NP REFERRING PROVIDER: Jerona Sage, MD  END OF SESSION:  PT End of Session - 12/28/23 1321     Visit Number 9    Number of Visits 25    Date for Recertification  02/19/24    Authorization Type [employer] dual    Progress Note Due on Visit 10    PT Start Time 1318    PT Stop Time 1403    PT Time Calculation (min) 45 min    Equipment Utilized During Treatment Gait belt    Activity Tolerance Patient tolerated treatment well    Behavior During Therapy WFL for tasks assessed/performed           Past Medical History:  Diagnosis Date   Acute H. pylori gastric ulcer    Coffee ground emesis    Diabetes mellitus (HCC)    Diabetic gastroparesis (HCC)    DKA (diabetic ketoacidosis) (HCC) 02/24/2021   Gastroparesis    GERD (gastroesophageal reflux disease)    Hypertension    Hyperthyroidism    Intractable nausea and vomiting 04/20/2021   Normocytic anemia 04/20/2020   Prolonged Q-T interval on ECG    Past Surgical History:  Procedure Laterality Date   AMPUTATION Bilateral 10/24/2022   Procedure: BILATERAL BELOW KNEE AMPUTATION;  Surgeon: Sage Jerona GAILS, MD;  Location: [facility] OR;  Service: Orthopedics;  Laterality: Bilateral;   AMPUTATION Bilateral 10/08/2022   Procedure: BILATERAL LEG DEBRIDEMENT;  Surgeon: Sage Jerona GAILS, MD;  Location: [facility] OR;  Service: Orthopedics;  Laterality: Bilateral;   AMPUTATION Bilateral 11/14/2022   Procedure: BILATERAL ABOVE KNEE AMPUTATION;  Surgeon: Sage Jerona GAILS, MD;  Location: [facility] OR;  Service: Orthopedics;  Laterality: Bilateral;   AMPUTATION TOE Left 03/10/2018   Procedure: AMPUTATION FIFTH TOE;  Surgeon: Gershon Donnice SAUNDERS, DPM;  Location: [facility];  Service: Podiatry;  Laterality: Left;   APPLICATION OF WOUND VAC Bilateral 10/12/2022   Procedure:  APPLICATION OF WOUND VAC BILATERAL LEGS;  Surgeon: Celena Sharper, MD;  Location: [facility] OR;  Service: Orthopedics;  Laterality: Bilateral;   APPLICATION OF WOUND VAC Bilateral 10/24/2022   Procedure: APPLICATION OF WOUND VAC;  Surgeon: Sage Jerona GAILS, MD;  Location: [facility] OR;  Service: Orthopedics;  Laterality: Bilateral;   BIOPSY  01/28/2020   Procedure: BIOPSY;  Surgeon: Elicia Claw, MD;  Location: [facility];  Service: Gastroenterology;;   BOTOX  INJECTION  08/26/2021   Procedure: BOTOX  INJECTION;  Surgeon: Legrand Victory LITTIE DOUGLAS, MD;  Location: [facility];  Service: Gastroenterology;;   ESOPHAGOGASTRODUODENOSCOPY N/A 01/28/2020   Procedure: ESOPHAGOGASTRODUODENOSCOPY (EGD);  Surgeon: Elicia Claw, MD;  Location: [facility];  Service: Gastroenterology;  Laterality: N/A;   ESOPHAGOGASTRODUODENOSCOPY (EGD) WITH PROPOFOL  Left 09/08/2015   Procedure: ESOPHAGOGASTRODUODENOSCOPY (EGD) WITH PROPOFOL ;  Surgeon: Elsie Cree, MD;  Location: [facility];  Service: Endoscopy;  Laterality: Left;   ESOPHAGOGASTRODUODENOSCOPY (EGD) WITH PROPOFOL  N/A 08/26/2021   Procedure: ESOPHAGOGASTRODUODENOSCOPY (EGD) WITH PROPOFOL ;  Surgeon: Legrand Victory LITTIE DOUGLAS, MD;  Location: [facility];  Service: Gastroenterology;  Laterality: N/A;   GASTRIC STIMULATOR IMPLANT SURGERY  06/2022   at [facility]   I & D EXTREMITY Bilateral 10/12/2022   Procedure: IRRIGATION AND  DEBRIDEMENT  BILATERAL LEGS;  Surgeon: Celena Sharper, MD;  Location: [facility] OR;  Service: Orthopedics;  Laterality: Bilateral;   I & D EXTREMITY Right 10/13/2022   Procedure: RIGHT THIGH WOUND VAC  EXCHANGE;  Surgeon: Kendal Franky SQUIBB, MD;  Location: [facility] OR;  Service: Orthopedics;  Laterality: Right;   I & D EXTREMITY Bilateral 10/17/2022   Procedure: BILATERAL THIGH AND LEG DEBRIDEMENT, PARTIAL BILATERAL FOOT AMPUTATION;  Surgeon: Harden Jerona GAILS, MD;  Location: [facility] OR;  Service: Orthopedics;  Laterality: Bilateral;   I & D EXTREMITY Bilateral 11/05/2022   Procedure:  BILATERAL THIGH DEBRIDEMENT;  Surgeon: Harden Jerona GAILS, MD;  Location: [facility] OR;  Service: Orthopedics;  Laterality: Bilateral;   I & D EXTREMITY Bilateral 11/07/2022   Procedure: BILATERAL THIGH AND LEG DEBRIDEMENT;  Surgeon: Harden Jerona GAILS, MD;  Location: [facility] OR;  Service: Orthopedics;  Laterality: Bilateral;   I & D EXTREMITY Bilateral 11/12/2022   Procedure: BILATERAL LEG DEBRIDEMENTS;  Surgeon: Harden Jerona GAILS, MD;  Location: [facility] OR;  Service: Orthopedics;  Laterality: Bilateral;   IRRIGATION AND DEBRIDEMENT FOOT Bilateral 10/05/2022   Procedure: IRRIGATION AND DEBRIDEMENT FOOT;  Surgeon: Janit Thresa HERO, DPM;  Location: [facility];  Service: Orthopedics/Podiatry;  Laterality: Bilateral;   WISDOM TOOTH EXTRACTION     Patient Active Problem List   Diagnosis Date Noted   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   Hypokalemia 11/27/2022   Effusion, left knee 11/05/2022   MDD (major depressive disorder), recurrent episode, moderate (HCC) 10/16/2022   Necrotizing fasciitis of pelvic region and thigh (HCC) 10/08/2022   Necrotizing fasciitis of lower leg (HCC) 10/08/2022   Necrotizing fasciitis (HCC) 10/08/2022   Sepsis due to cellulitis (HCC) 10/03/2022   Cellulitis of lower extremity 10/03/2022   Multinodular goiter 06/18/2022   Malnutrition of moderate degree 04/18/2022   Intractable vomiting with nausea 04/17/2022   DKA (diabetic ketoacidosis) (HCC) 03/04/2022   Nausea vomiting and diarrhea 12/25/2021   Diabetic gastroparesis (HCC) 10/09/2021   Drug-seeking behavior  09/21/2021   Essential hypertension 08/25/2021   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) 08/25/2021   GERD without esophagitis 08/25/2021   Hematemesis    Prolonged Q-T interval on ECG    Nausea and vomiting 07/20/2021   Anemia of chronic disease 06/06/2021   Dehydration 06/04/2021   Hypomagnesemia 04/21/2021   Ulcerated, foot, left, with fat layer exposed (HCC) 04/20/2021   Uncontrolled type 1 diabetes mellitus with hyperglycemia, with long-term current use of insulin  (HCC) 12/18/2020   DM type 1 (diabetes mellitus, type 1) (HCC) 12/18/2020   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 11/09/2020   Diabetic retinopathy of both eyes associated with type 1 diabetes mellitus (HCC) 09/24/2020   Diabetic gastroparesis associated with type 1 diabetes mellitus (HCC) 08/12/2020   Acute kidney injury superimposed on chronic kidney disease 07/25/2020   Generalized abdominal pain 07/23/2020   GERD (gastroesophageal reflux disease) 07/23/2020   Diabetic nephropathy associated with type 1 diabetes mellitus (HCC) 06/27/2020   Sepsis (HCC) 05/27/2020   HLD (hyperlipidemia) 05/23/2020   Sinus tachycardia 05/09/2020   Anemia 04/20/2020   Depression with anxiety 07/05/2019   Diabetic polyneuropathy associated with type 1 diabetes mellitus (HCC) 09/24/2017   Gastroparesis    Goiter 10/05/2009   DM2 (diabetes  mellitus, type 2) (HCC) 10/05/2009   Hypertension associated with diabetes (HCC) 10/05/2009    ONSET DATE: 11/02/23 referral   REFERRING DIAG: [phone] (ICD-10-CM) - S/P AKA (above knee amputation) bilateral (HCC)   THERAPY DIAG:  Muscle weakness (generalized)  Other abnormalities of gait and mobility  Abnormal posture  Difficulty in walking, not elsewhere classified  Other symptoms and signs involving the musculoskeletal system  Rationale for Evaluation and Treatment: Rehabilitation  SUBJECTIVE:                                                                                                                                                                                              SUBJECTIVE STATEMENT: Patient [facility] to clinic in [facility] accompanied by significant other, Drew. Prosthetics were donned, reports left needs adjusting. Patient denies falls.  Blood sugar 139.  Pt accompanied by: significant other  PERTINENT HISTORY: DM, diabetic gastroparesis, GERD, HTN, hyperthyroidism, normocytic anemia, B AKA  PAIN:  Are you having pain? No - reports pain in left groin region w/ pressure from socket  PRECAUTIONS: Fall   PATIENT GOALS: to learn how to walk with new prosthetics                                                                                                                             TREATMENT   Therapeutic Activity: -Attempt x3 to push/pull to stand from manual wheelchair to sink first w/o knee support > using dycem for prosthetic knee grip against cabinet > pillow -Pt SBA squat pivot manual wheelchair to standard chair (right) -Time spent adjusting wheelchair components to approx. 1/2 inch higher to aid in ability to stand to sink at home -Pt SBA squat pivot manual wheelchair to standard chair (left) -x2 additional attempts at pull-to-stand w/ pt able to stand minA +2 on second attempt using adduction pillow and knee bolster to stabilize LE positioning, able to initiate toe off to bend knees for sitting w/o issue today.  PATIENT EDUCATION: Education details: Continue HEP.  Pt to schedule [employer] to adjust height of wheelchair (second therapist assisting at  end of session).  Practicing push/pull to lift bottom from wheelchair vs trying to fully stand to sink.  Person educated: Patient and SO Education method: Medical Illustrator Education comprehension: verbalized understanding and needs further education  HOME EXERCISE PROGRAM: -practice donning/doffing prosthetics as much as possible   GOALS: Goals  reviewed with patient? Yes  SHORT TERM GOALS: Target date: 12/25/23  Pt will be independent with initial HEP for improved functional mobility and independence with new prosthetics  Baseline: to be provided Goal status: INITIAL  2.  Patient will complete sit <> stand with LRAD and no more than MinA consistently  Baseline: up to MaxA in // bars Goal status: INITIAL  3.  Patient will initiate ambulation with an ambulatory device with assist as needed Baseline: only able to ambulate in // bars Goal status: REVISED  4.  Patient will be able to don/doff B prosthetics with no more than MinA  Baseline: MaxA  Goal status: INITIAL  LONG TERM GOALS: Target date: 02/19/24  Pt will be independent with final HEP for improved functional mobility and independence with new prosthetics  Baseline: to be provided Goal status: INITIAL  2.  Patient will complete sit <> stand with LRAD with no more than supervision consistently  Baseline: MaxA in // bars  Goal status: INITIAL  3.  Patient will ambulate >/=53ft with ambulatory device and no more than MinA Baseline: unable-only ambulating in // bars Goal status: REVISED  4.  Patient will traverse at least 1 community obstacle (curb vs ramp) with LRAD and no more than CGA Baseline: unable on eval  Goal status: INITIAL  5.  Patient will don/doff B prosthetics independently  Baseline: MaxA Goal status: INITIAL   ASSESSMENT:  CLINICAL IMPRESSION: Patient seen for skilled physical therapy today with emphasis on gait training with bilateral mechanical prosthetic knees. Session began with sit to stand and weight shifting in parallel bars. Advanced to gait training in parallel bars both forward and retrograde. Session then proceeded to gait training outside of parallel bars with front wheeled rolling walker. Patient continues to need moderate assistance with sit to stand and advancing gait with minimal assist and some guidance with manual knee extension  when standing. With less resistance of knee flexion of B MPK, there is a need to ensure and assist with knee extension during standing transfer. Patient continues to improve step width and was able to appropriately trigger knee flexion through swing phase with toe push off in terminal stance. Continue POC.   OBJECTIVE IMPAIRMENTS: Abnormal gait, cardiopulmonary status limiting activity, decreased activity tolerance, decreased balance, decreased endurance, decreased knowledge of use of DME, decreased mobility, difficulty walking, decreased strength, impaired vision/preception, and prosthetic dependency .   ACTIVITY LIMITATIONS: carrying, lifting, bending, standing, squatting, stairs, locomotion level, and caring for others  PARTICIPATION LIMITATIONS: meal prep, cleaning, interpersonal relationship, driving, shopping, community activity, and occupation  PERSONAL FACTORS: Age, Behavior pattern, Fitness, Past/current experiences, Social background, Time since onset of injury/illness/exacerbation, Transportation, and 3+ comorbidities: see above are also affecting patient's functional outcome.   REHAB POTENTIAL: Fair complexity of comorbidities   CLINICAL DECISION MAKING: Evolving/moderate complexity  EVALUATION COMPLEXITY: Moderate  PLAN:  PT FREQUENCY: 2x/week  PT DURATION: 12 weeks  PLANNED INTERVENTIONS: [id number]- PT Re-evaluation, 97750- Physical Performance Testing, 97110-Therapeutic exercises, 97530- Therapeutic activity, [id number]- Neuromuscular re-education, 97535- Self Care, [id number]- Manual therapy, [id number]- Gait training, [id number]- Orthotic Initial, [id number]- Prosthetic Initial , [id number]- Orthotic/Prosthetic subsequent, [id number]- Canalith repositioning, [id number]- Aquatic Therapy, [id number] (1-2 muscles), [id number] (3+  muscles)- Dry Needling, Patient/Family education, Balance training, Stair training, Vestibular training, Visual/preceptual remediation/compensation, DME instructions, Wheelchair mobility training,  Cryotherapy, and Moist heat  PLAN FOR NEXT SESSION: sink HEP?, adjust knee resistance in app PRN, stand from higher mat to walker and attempt gait with bari walker. Did great with BWS harness as fall precaution! (Just cumbersome)    Daved KATHEE Bull, PT, DPT  12/28/2023, 2:06 PM

## 2023-12-31 ENCOUNTER — Ambulatory Visit: Admitting: Physical Therapy

## 2023-12-31 ENCOUNTER — Encounter: Payer: Self-pay | Admitting: Physical Therapy

## 2023-12-31 DIAGNOSIS — M6281 Muscle weakness (generalized): Secondary | ICD-10-CM

## 2023-12-31 DIAGNOSIS — R293 Abnormal posture: Secondary | ICD-10-CM

## 2023-12-31 DIAGNOSIS — R29898 Other symptoms and signs involving the musculoskeletal system: Secondary | ICD-10-CM

## 2023-12-31 DIAGNOSIS — R262 Difficulty in walking, not elsewhere classified: Secondary | ICD-10-CM

## 2023-12-31 DIAGNOSIS — R2689 Other abnormalities of gait and mobility: Secondary | ICD-10-CM

## 2023-12-31 NOTE — Therapy (Unsigned)
 OUTPATIENT PHYSICAL THERAPY NEURO TREATMENT   Patient Name: Kelli Hudson MRN: [id number] DOB:Apr 05, 1990, 33 y.o., female Today's Date: 01/01/2024   PCP: Daphne Lesches, NP REFERRING PROVIDER: Jerona Sage, MD  END OF SESSION:  PT End of Session - 12/31/23 1553     Visit Number 10    Number of Visits 25    Date for Recertification  02/19/24    Authorization Type [employer] dual    Progress Note Due on Visit 10    PT Start Time 1544    PT Stop Time 1628    PT Time Calculation (min) 44 min    Equipment Utilized During Treatment Gait belt    Activity Tolerance Patient tolerated treatment well    Behavior During Therapy WFL for tasks assessed/performed           Past Medical History:  Diagnosis Date   Acute H. pylori gastric ulcer    Coffee ground emesis    Diabetes mellitus (HCC)    Diabetic gastroparesis (HCC)    DKA (diabetic ketoacidosis) (HCC) 02/24/2021   Gastroparesis    GERD (gastroesophageal reflux disease)    Hypertension    Hyperthyroidism    Intractable nausea and vomiting 04/20/2021   Normocytic anemia 04/20/2020   Prolonged Q-T interval on ECG    Past Surgical History:  Procedure Laterality Date   AMPUTATION Bilateral 10/24/2022   Procedure: BILATERAL BELOW KNEE AMPUTATION;  Surgeon: Sage Jerona GAILS, MD;  Location: [facility] OR;  Service: Orthopedics;  Laterality: Bilateral;   AMPUTATION Bilateral 10/08/2022   Procedure: BILATERAL LEG DEBRIDEMENT;  Surgeon: Sage Jerona GAILS, MD;  Location: [facility] OR;  Service: Orthopedics;  Laterality: Bilateral;   AMPUTATION Bilateral 11/14/2022   Procedure: BILATERAL ABOVE KNEE AMPUTATION;  Surgeon: Sage Jerona GAILS, MD;  Location: [facility] OR;  Service: Orthopedics;  Laterality: Bilateral;   AMPUTATION TOE Left 03/10/2018   Procedure: AMPUTATION FIFTH TOE;  Surgeon: Gershon Donnice SAUNDERS, DPM;  Location: [facility];  Service: Podiatry;  Laterality: Left;   APPLICATION OF WOUND VAC Bilateral 10/12/2022   Procedure:  APPLICATION OF WOUND VAC BILATERAL LEGS;  Surgeon: Celena Sharper, MD;  Location: [facility] OR;  Service: Orthopedics;  Laterality: Bilateral;   APPLICATION OF WOUND VAC Bilateral 10/24/2022   Procedure: APPLICATION OF WOUND VAC;  Surgeon: Sage Jerona GAILS, MD;  Location: [facility] OR;  Service: Orthopedics;  Laterality: Bilateral;   BIOPSY  01/28/2020   Procedure: BIOPSY;  Surgeon: Elicia Claw, MD;  Location: [facility];  Service: Gastroenterology;;   BOTOX  INJECTION  08/26/2021   Procedure: BOTOX  INJECTION;  Surgeon: Legrand Victory LITTIE DOUGLAS, MD;  Location: [facility];  Service: Gastroenterology;;   ESOPHAGOGASTRODUODENOSCOPY N/A 01/28/2020   Procedure: ESOPHAGOGASTRODUODENOSCOPY (EGD);  Surgeon: Elicia Claw, MD;  Location: [facility];  Service: Gastroenterology;  Laterality: N/A;   ESOPHAGOGASTRODUODENOSCOPY (EGD) WITH PROPOFOL  Left 09/08/2015   Procedure: ESOPHAGOGASTRODUODENOSCOPY (EGD) WITH PROPOFOL ;  Surgeon: Elsie Cree, MD;  Location: [facility] ENDOSCOPY;  Service: Endoscopy;  Laterality: Left;   ESOPHAGOGASTRODUODENOSCOPY (EGD) WITH PROPOFOL  N/A 08/26/2021   Procedure: ESOPHAGOGASTRODUODENOSCOPY (EGD) WITH PROPOFOL ;  Surgeon: Legrand Victory LITTIE DOUGLAS, MD;  Location: [facility];  Service: Gastroenterology;  Laterality: N/A;   GASTRIC STIMULATOR IMPLANT SURGERY  06/2022   at [facility]   I & D EXTREMITY Bilateral 10/12/2022   Procedure: IRRIGATION AND  DEBRIDEMENT  BILATERAL LEGS;  Surgeon: Celena Sharper, MD;  Location: [facility] OR;  Service: Orthopedics;  Laterality: Bilateral;   I & D EXTREMITY Right 10/13/2022   Procedure: RIGHT THIGH WOUND VAC  EXCHANGE;  Surgeon: Kendal Franky SQUIBB, MD;  Location: [facility] OR;  Service: Orthopedics;  Laterality: Right;   I & D EXTREMITY Bilateral 10/17/2022   Procedure: BILATERAL THIGH AND LEG DEBRIDEMENT, PARTIAL BILATERAL FOOT AMPUTATION;  Surgeon: Harden Jerona GAILS, MD;  Location: [facility] OR;  Service: Orthopedics;  Laterality: Bilateral;   I & D EXTREMITY Bilateral 11/05/2022   Procedure:  BILATERAL THIGH DEBRIDEMENT;  Surgeon: Harden Jerona GAILS, MD;  Location: [facility] OR;  Service: Orthopedics;  Laterality: Bilateral;   I & D EXTREMITY Bilateral 11/07/2022   Procedure: BILATERAL THIGH AND LEG DEBRIDEMENT;  Surgeon: Harden Jerona GAILS, MD;  Location: [facility] OR;  Service: Orthopedics;  Laterality: Bilateral;   I & D EXTREMITY Bilateral 11/12/2022   Procedure: BILATERAL LEG DEBRIDEMENTS;  Surgeon: Harden Jerona GAILS, MD;  Location: [facility] OR;  Service: Orthopedics;  Laterality: Bilateral;   IRRIGATION AND DEBRIDEMENT FOOT Bilateral 10/05/2022   Procedure: IRRIGATION AND DEBRIDEMENT FOOT;  Surgeon: Janit Thresa HERO, DPM;  Location: [facility]S;  Service: Orthopedics/Podiatry;  Laterality: Bilateral;   WISDOM TOOTH EXTRACTION     Patient Active Problem List   Diagnosis Date Noted   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   Hypokalemia 11/27/2022   Effusion, left knee 11/05/2022   MDD (major depressive disorder), recurrent episode, moderate (HCC) 10/16/2022   Necrotizing fasciitis of pelvic region and thigh (HCC) 10/08/2022   Necrotizing fasciitis of lower leg (HCC) 10/08/2022   Necrotizing fasciitis (HCC) 10/08/2022   Sepsis due to cellulitis (HCC) 10/03/2022   Cellulitis of lower extremity 10/03/2022   Multinodular goiter 06/18/2022   Malnutrition of moderate degree 04/18/2022   Intractable vomiting with nausea 04/17/2022   DKA (diabetic ketoacidosis) (HCC) 03/04/2022   Nausea vomiting and diarrhea 12/25/2021   Diabetic gastroparesis (HCC) 10/09/2021   Drug-seeking behavior  09/21/2021   Essential hypertension 08/25/2021   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) 08/25/2021   GERD without esophagitis 08/25/2021   Hematemesis    Prolonged Q-T interval on ECG    Nausea and vomiting 07/20/2021   Anemia of chronic disease 06/06/2021   Dehydration 06/04/2021   Hypomagnesemia 04/21/2021   Ulcerated, foot, left, with fat layer exposed (HCC) 04/20/2021   Uncontrolled type 1 diabetes mellitus with hyperglycemia, with long-term current use of insulin  (HCC) 12/18/2020   DM type 1 (diabetes mellitus, type 1) (HCC) 12/18/2020   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 11/09/2020   Diabetic retinopathy of both eyes associated with type 1 diabetes mellitus (HCC) 09/24/2020   Diabetic gastroparesis associated with type 1 diabetes mellitus (HCC) 08/12/2020   Acute kidney injury superimposed on chronic kidney disease 07/25/2020   Generalized abdominal pain 07/23/2020   GERD (gastroesophageal reflux disease) 07/23/2020   Diabetic nephropathy associated with type 1 diabetes mellitus (HCC) 06/27/2020   Sepsis (HCC) 05/27/2020   HLD (hyperlipidemia) 05/23/2020   Sinus tachycardia 05/09/2020   Anemia 04/20/2020   Depression with anxiety 07/05/2019   Diabetic polyneuropathy associated with type 1 diabetes mellitus (HCC) 09/24/2017   Gastroparesis    Goiter 10/05/2009   DM2 (diabetes  mellitus, type 2) (HCC) 10/05/2009   Hypertension associated with diabetes (HCC) 10/05/2009    ONSET DATE: 11/02/23 referral   REFERRING DIAG: [phone] (ICD-10-CM) - S/P AKA (above knee amputation) bilateral (HCC)   THERAPY DIAG:  Muscle weakness (generalized)  Other abnormalities of gait and mobility  Abnormal posture  Difficulty in walking, not elsewhere classified  Other symptoms and signs involving the musculoskeletal system  Rationale for Evaluation and Treatment: Rehabilitation  SUBJECTIVE:                                                                                                                                                                                              SUBJECTIVE STATEMENT: Patient arrives to clinic in [facility] accompanied by significant other, Drew. Prosthetics were donned, reports left socket flared but top of socket still feels tighter than right which is contoured nicely. Patient denies falls.  Blood sugar 204.  She reports she was given information on kidney transplant fitness test this morning.  Visit with [facility] went well and she got new shrinkers and was able to walk in // bars and obtain beep with every step.  [employer] to raise manual chair height around Nov 20.  Pt accompanied by: significant other  PERTINENT HISTORY: DM, diabetic gastroparesis, GERD, HTN, hyperthyroidism, normocytic anemia, B AKA  PAIN:  Are you having pain? No - reports pain in left groin region w/ pressure from socket  PRECAUTIONS: Fall   PATIENT GOALS: to learn how to walk with new prosthetics                                                                                                                             TREATMENT   Therapeutic Activity: Pt provided information on Kidney Transplant Fitness Test:    -15' (in 3-4 sec no AD)  -5xSTS </=11 sec no UE  -Hold tandem > semi-tandem > Rhomberg x10 sec each  -Grip strength (dominant hand)  Gait: -STS from 24 mat table w/ +2 therapists (one therapist posteriorly to facilitate APT) > ambulates x10 ft straight w/ +2 w/c follow -  STS from 27 mat table w/ 1 therapist > ambulates x12 ft w/ +2 w/c follow and therapist assisting w/ 2WW placement to facilitate left turning; increased cuing and minA to improve RLE width of step and LLE inv/IR -3rd pull-to-stand in // bars for 2x6' ambulation practicing turns x2 w/ BUE support, minA for LLE placement as it gets caught on beam of bars; SBA-CGA for ambulatory portions; improved eccentric control into sitting in manual w/c -Prosthetics  doffed IND at end of session  PATIENT EDUCATION: Education details: Continue HEP.  Practicing push/pull to lift bottom from wheelchair vs trying to fully stand to sink.  Person educated: Patient and SO Education method: Medical Illustrator Education comprehension: verbalized understanding and needs further education  HOME EXERCISE PROGRAM: -practice donning/doffing prosthetics as much as possible   GOALS: Goals reviewed with patient? Yes  SHORT TERM GOALS: Target date: 12/25/23  Pt will be independent with initial HEP for improved functional mobility and independence with new prosthetics  Baseline: to be provided Goal status: INITIAL  2.  Patient will complete sit <> stand with LRAD and no more than MinA consistently  Baseline: up to MaxA in // bars Goal status: INITIAL  3.  Patient will initiate ambulation with an ambulatory device with assist as needed Baseline: only able to ambulate in // bars Goal status: REVISED  4.  Patient will be able to don/doff B prosthetics with no more than MinA  Baseline: MaxA  Goal status: INITIAL  LONG TERM GOALS: Target date: 02/19/24  Pt will be independent with final HEP for improved functional mobility and independence with new prosthetics  Baseline: to be provided Goal status: INITIAL  2.  Patient will complete sit <> stand with LRAD with no more than supervision consistently  Baseline: MaxA in // bars  Goal status: INITIAL  3.  Patient will ambulate >/=22ft with ambulatory device and no more than MinA Baseline: unable-only ambulating in // bars Goal status: REVISED  4.  Patient will traverse at least 1 community obstacle (curb vs ramp) with LRAD and no more than CGA Baseline: unable on eval  Goal status: INITIAL  5.  Patient will don/doff B prosthetics independently  Baseline: MaxA Goal status: INITIAL   ASSESSMENT:  CLINICAL IMPRESSION: Focus of skilled session today on addressing gait mechanics with use of  bilateral MPK prostheses.  She is able to ambulate x12 ft at longest distance w/ + 2 w/c follow and PT facilitating turning and intermittent foot placement due to visual limitations.  She stands to benefit from further skilled PT in this setting to optimize independence with prosthetic use for standing transfers and ambulation w/ LRAD.  She is demonstrating improving tolerance and generalized strength as noted with progression in transfers and ambulatory capacity with higher level prosthetics.  Continue POC.   OBJECTIVE IMPAIRMENTS: Abnormal gait, cardiopulmonary status limiting activity, decreased activity tolerance, decreased balance, decreased endurance, decreased knowledge of use of DME, decreased mobility, difficulty walking, decreased strength, impaired vision/preception, and prosthetic dependency .   ACTIVITY LIMITATIONS: carrying, lifting, bending, standing, squatting, stairs, locomotion level, and caring for others  PARTICIPATION LIMITATIONS: meal prep, cleaning, interpersonal relationship, driving, shopping, community activity, and occupation  PERSONAL FACTORS: Age, Behavior pattern, Fitness, Past/current experiences, Social background, Time since onset of injury/illness/exacerbation, Transportation, and 3+ comorbidities: see above are also affecting patient's functional outcome.   REHAB POTENTIAL: Fair complexity of comorbidities   CLINICAL DECISION MAKING: Evolving/moderate complexity  EVALUATION COMPLEXITY: Moderate  PLAN:  PT FREQUENCY: 2x/week  PT DURATION: 12 weeks  PLANNED INTERVENTIONS: [id number]- PT Re-evaluation, 97750- Physical Performance Testing, 97110-Therapeutic exercises, 97530- Therapeutic activity, [id number]- Neuromuscular re-education, 97535- Self Care, [id number]- Manual therapy, [id number]- Gait training, [phone]- Orthotic Initial, [phone]- Prosthetic Initial , [phone]- Orthotic/Prosthetic subsequent, [phone]- Canalith repositioning, [id number]- Aquatic Therapy, [phone] (1-2 muscles), [phone] (3+  muscles)- Dry Needling, Patient/Family education, Balance training, Stair training, Vestibular training, Visual/preceptual remediation/compensation, DME instructions, Wheelchair mobility training, Cryotherapy, and Moist heat  PLAN FOR NEXT SESSION: sink HEP?, adjust knee resistance in app PRN, stand from higher mat to walker and attempt gait with bari walker. Did great with BWS harness as fall precaution! (Just cumbersome)    Daved KATHEE Bull, PT, DPT  01/01/2024, 5:07 PM

## 2024-01-04 ENCOUNTER — Ambulatory Visit: Admitting: Physical Therapy

## 2024-01-04 ENCOUNTER — Encounter: Payer: Self-pay | Admitting: Physical Therapy

## 2024-01-04 DIAGNOSIS — R2689 Other abnormalities of gait and mobility: Secondary | ICD-10-CM

## 2024-01-04 DIAGNOSIS — M6281 Muscle weakness (generalized): Secondary | ICD-10-CM | POA: Diagnosis not present

## 2024-01-04 DIAGNOSIS — R29898 Other symptoms and signs involving the musculoskeletal system: Secondary | ICD-10-CM

## 2024-01-04 DIAGNOSIS — R262 Difficulty in walking, not elsewhere classified: Secondary | ICD-10-CM

## 2024-01-04 DIAGNOSIS — R293 Abnormal posture: Secondary | ICD-10-CM

## 2024-01-04 NOTE — Therapy (Signed)
 OUTPATIENT PHYSICAL THERAPY NEURO TREATMENT   Patient Name: Kelli Hudson MRN: [id number] DOB:[birth year], 33 y.o., female Today's Date: 01/04/2024   PCP: Daphne Lesches, NP REFERRING PROVIDER: Jerona Sage, MD  END OF SESSION:  PT End of Session - 01/04/24 1324     Visit Number 11    Number of Visits 25    Date for Recertification  02/19/24    Authorization Type [employer] dual    Progress Note Due on Visit 10    PT Start Time 1318    PT Stop Time 1402    PT Time Calculation (min) 44 min    Equipment Utilized During Treatment Gait belt    Activity Tolerance Patient tolerated treatment well    Behavior During Therapy WFL for tasks assessed/performed           Past Medical History:  Diagnosis Date   Acute H. pylori gastric ulcer    Coffee ground emesis    Diabetes mellitus (HCC)    Diabetic gastroparesis (HCC)    DKA (diabetic ketoacidosis) (HCC) 02/24/2021   Gastroparesis    GERD (gastroesophageal reflux disease)    Hypertension    Hyperthyroidism    Intractable nausea and vomiting 04/20/2021   Normocytic anemia 04/20/2020   Prolonged Q-T interval on ECG    Past Surgical History:  Procedure Laterality Date   AMPUTATION Bilateral 10/24/2022   Procedure: BILATERAL BELOW KNEE AMPUTATION;  Surgeon: Sage Jerona GAILS, MD;  Location: [facility] OR;  Service: Orthopedics;  Laterality: Bilateral;   AMPUTATION Bilateral 10/08/2022   Procedure: BILATERAL LEG DEBRIDEMENT;  Surgeon: Sage Jerona GAILS, MD;  Location: [facility] OR;  Service: Orthopedics;  Laterality: Bilateral;   AMPUTATION Bilateral 11/14/2022   Procedure: BILATERAL ABOVE KNEE AMPUTATION;  Surgeon: Sage Jerona GAILS, MD;  Location: [facility] OR;  Service: Orthopedics;  Laterality: Bilateral;   AMPUTATION TOE Left 03/10/2018   Procedure: AMPUTATION FIFTH TOE;  Surgeon: Gershon Donnice SAUNDERS, DPM;  Location: [facility];  Service: Podiatry;  Laterality: Left;   APPLICATION OF WOUND VAC Bilateral 10/12/2022   Procedure:  APPLICATION OF WOUND VAC BILATERAL LEGS;  Surgeon: Celena Sharper, MD;  Location: [facility] OR;  Service: Orthopedics;  Laterality: Bilateral;   APPLICATION OF WOUND VAC Bilateral 10/24/2022   Procedure: APPLICATION OF WOUND VAC;  Surgeon: Sage Jerona GAILS, MD;  Location: [facility] OR;  Service: Orthopedics;  Laterality: Bilateral;   BIOPSY  01/28/2020   Procedure: BIOPSY;  Surgeon: Elicia Claw, MD;  Location: [facility];  Service: Gastroenterology;;   BOTOX  INJECTION  08/26/2021   Procedure: BOTOX  INJECTION;  Surgeon: Legrand Victory LITTIE DOUGLAS, MD;  Location: [facility];  Service: Gastroenterology;;   ESOPHAGOGASTRODUODENOSCOPY N/A 01/28/2020   Procedure: ESOPHAGOGASTRODUODENOSCOPY (EGD);  Surgeon: Elicia Claw, MD;  Location: [facility];  Service: Gastroenterology;  Laterality: N/A;   ESOPHAGOGASTRODUODENOSCOPY (EGD) WITH PROPOFOL  Left 09/08/2015   Procedure: ESOPHAGOGASTRODUODENOSCOPY (EGD) WITH PROPOFOL ;  Surgeon: Elsie Cree, MD;  Location: [facility] ENDOSCOPY;  Service: Endoscopy;  Laterality: Left;   ESOPHAGOGASTRODUODENOSCOPY (EGD) WITH PROPOFOL  N/A 08/26/2021   Procedure: ESOPHAGOGASTRODUODENOSCOPY (EGD) WITH PROPOFOL ;  Surgeon: Legrand Victory LITTIE DOUGLAS, MD;  Location: [facility];  Service: Gastroenterology;  Laterality: N/A;   GASTRIC STIMULATOR IMPLANT SURGERY  06/2022   at [facility]   I & D EXTREMITY Bilateral 10/12/2022   Procedure: IRRIGATION AND  DEBRIDEMENT  BILATERAL LEGS;  Surgeon: Celena Sharper, MD;  Location: [facility] OR;  Service: Orthopedics;  Laterality: Bilateral;   I & D EXTREMITY Right 10/13/2022   Procedure: RIGHT THIGH WOUND VAC  EXCHANGE;  Surgeon: Kendal Franky SQUIBB, MD;  Location: [facility] OR;  Service: Orthopedics;  Laterality: Right;   I & D EXTREMITY Bilateral 10/17/2022   Procedure: BILATERAL THIGH AND LEG DEBRIDEMENT, PARTIAL BILATERAL FOOT AMPUTATION;  Surgeon: Harden Jerona GAILS, MD;  Location: [facility] OR;  Service: Orthopedics;  Laterality: Bilateral;   I & D EXTREMITY Bilateral 11/05/2022   Procedure:  BILATERAL THIGH DEBRIDEMENT;  Surgeon: Harden Jerona GAILS, MD;  Location: [facility] OR;  Service: Orthopedics;  Laterality: Bilateral;   I & D EXTREMITY Bilateral 11/07/2022   Procedure: BILATERAL THIGH AND LEG DEBRIDEMENT;  Surgeon: Harden Jerona GAILS, MD;  Location: [facility] OR;  Service: Orthopedics;  Laterality: Bilateral;   I & D EXTREMITY Bilateral 11/12/2022   Procedure: BILATERAL LEG DEBRIDEMENTS;  Surgeon: Harden Jerona GAILS, MD;  Location: [facility] OR;  Service: Orthopedics;  Laterality: Bilateral;   IRRIGATION AND DEBRIDEMENT FOOT Bilateral 10/05/2022   Procedure: IRRIGATION AND DEBRIDEMENT FOOT;  Surgeon: Janit Thresa HERO, DPM;  Location: [facility];  Service: Orthopedics/Podiatry;  Laterality: Bilateral;   WISDOM TOOTH EXTRACTION     Patient Active Problem List   Diagnosis Date Noted   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   Hypokalemia 11/27/2022   Effusion, left knee 11/05/2022   MDD (major depressive disorder), recurrent episode, moderate (HCC) 10/16/2022   Necrotizing fasciitis of pelvic region and thigh (HCC) 10/08/2022   Necrotizing fasciitis of lower leg (HCC) 10/08/2022   Necrotizing fasciitis (HCC) 10/08/2022   Sepsis due to cellulitis (HCC) 10/03/2022   Cellulitis of lower extremity 10/03/2022   Multinodular goiter 06/18/2022   Malnutrition of moderate degree 04/18/2022   Intractable vomiting with nausea 04/17/2022   DKA (diabetic ketoacidosis) (HCC) 03/04/2022   Nausea vomiting and diarrhea 12/25/2021   Diabetic gastroparesis (HCC) 10/09/2021   Drug-seeking behavior  09/21/2021   Essential hypertension 08/25/2021   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) 08/25/2021   GERD without esophagitis 08/25/2021   Hematemesis    Prolonged Q-T interval on ECG    Nausea and vomiting 07/20/2021   Anemia of chronic disease 06/06/2021   Dehydration 06/04/2021   Hypomagnesemia 04/21/2021   Ulcerated, foot, left, with fat layer exposed (HCC) 04/20/2021   Uncontrolled type 1 diabetes mellitus with hyperglycemia, with long-term current use of insulin  (HCC) 12/18/2020   DM type 1 (diabetes mellitus, type 1) (HCC) 12/18/2020   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 11/09/2020   Diabetic retinopathy of both eyes associated with type 1 diabetes mellitus (HCC) 09/24/2020   Diabetic gastroparesis associated with type 1 diabetes mellitus (HCC) 08/12/2020   Acute kidney injury superimposed on chronic kidney disease 07/25/2020   Generalized abdominal pain 07/23/2020   GERD (gastroesophageal reflux disease) 07/23/2020   Diabetic nephropathy associated with type 1 diabetes mellitus (HCC) 06/27/2020   Sepsis (HCC) 05/27/2020   HLD (hyperlipidemia) 05/23/2020   Sinus tachycardia 05/09/2020   Anemia 04/20/2020   Depression with anxiety 07/05/2019   Diabetic polyneuropathy associated with type 1 diabetes mellitus (HCC) 09/24/2017   Gastroparesis    Goiter 10/05/2009   DM2 (diabetes  mellitus, type 2) (HCC) 10/05/2009   Hypertension associated with diabetes (HCC) 10/05/2009    ONSET DATE: 11/02/23 referral   REFERRING DIAG: [id number] (ICD-10-CM) - S/P AKA (above knee amputation) bilateral (HCC)   THERAPY DIAG:  Muscle weakness (generalized)  Other abnormalities of gait and mobility  Abnormal posture  Difficulty in walking, not elsewhere classified  Other symptoms and signs involving the musculoskeletal system  Rationale for Evaluation and Treatment: Rehabilitation  SUBJECTIVE:                                                                                                                                                                                              SUBJECTIVE STATEMENT: Patient arrives to clinic in [facility] accompanied by significant other, Drew and mother and 2 other family members. Prosthetics were donned.  She got new shrinkers from [place] and has been wearing them.  Patient denies falls.  Blood sugar 237.  [employer] to raise manual chair height around Nov 20.  Pt accompanied by: significant other  PERTINENT HISTORY: DM, diabetic gastroparesis, GERD, HTN, hyperthyroidism, normocytic anemia, B AKA  PAIN:  Are you having pain? No - reports pain in left groin region w/ pressure from socket  PRECAUTIONS: Fall   PATIENT GOALS: to learn how to walk with new prosthetics                                                                                                                             TREATMENT  THERACT: -STS x5 from 27 mat table w/ +2 progressing to CGA for stabilizing 2WW as pt prefers BUE on 2WW for anterior translation -STS x5 from 25 mat table w/ +2 minA-CGA w/ repetition for stabilizing 2WW as pt prefers BUE on 2WW for anterior translation (push-to-stand attempted x1 w/ maxA needed for partial stand) -STS x5 from 23 mat table w/ +2 modA-minA w/ repetition for stabilizing 2WW as pt prefers BUE on 2WW for anterior translation; continued use of support at left socket and knee to improve pt vertical mechanics vs forward translation -Stand step to left using 2WW -  stability provided at walker only w/ cues for sequencing  PATIENT EDUCATION: Education details: Continue HEP.  Practicing push/pull to lift bottom from wheelchair vs trying to fully stand to sink.  Person educated: Patient and SO Education method: Medical Illustrator Education comprehension: verbalized understanding and needs further education  HOME EXERCISE PROGRAM: -practice donning/doffing prosthetics as much as possible    GOALS: Goals reviewed with patient? Yes  SHORT TERM GOALS: Target date: 12/25/23  Pt will be independent with initial HEP for improved functional mobility and independence with new prosthetics  Baseline: to be provided Goal status: INITIAL  2.  Patient will complete sit <> stand with LRAD and no more than MinA consistently  Baseline: up to MaxA in // bars Goal status: INITIAL  3.  Patient will initiate ambulation with an ambulatory device with assist as needed Baseline: only able to ambulate in // bars Goal status: REVISED  4.  Patient will be able to don/doff B prosthetics with no more than MinA  Baseline: MaxA  Goal status: INITIAL  LONG TERM GOALS: Target date: 02/19/24  Pt will be independent with final HEP for improved functional mobility and independence with new prosthetics  Baseline: to be provided Goal status: INITIAL  2.  Patient will complete sit <> stand with LRAD with no more than supervision consistently  Baseline: MaxA in // bars  Goal status: INITIAL  3.  Patient will ambulate >/=61ft with ambulatory device and no more than MinA Baseline: unable-only ambulating in // bars Goal status: REVISED  4.  Patient will traverse at least 1 community obstacle (curb vs ramp) with LRAD and no more than CGA Baseline: unable on eval  Goal status: INITIAL  5.  Patient will don/doff B prosthetics independently  Baseline: MaxA Goal status: INITIAL   ASSESSMENT:  CLINICAL IMPRESSION: Focus of skilled session today on improving stand mechanics and decreasing assistance needed.  Pt able to progress to no more than minA w/ stabilizing 2WW and stability provided at LLE w/ repetition.  Able to progress depth of stand to 23 to more align with functional transfer goals in future.  She continues to benefit from skilled PT to improve gait mechanics and maximized use of MPK features.  Continue POC.   OBJECTIVE IMPAIRMENTS: Abnormal gait, cardiopulmonary status limiting activity,  decreased activity tolerance, decreased balance, decreased endurance, decreased knowledge of use of DME, decreased mobility, difficulty walking, decreased strength, impaired vision/preception, and prosthetic dependency .   ACTIVITY LIMITATIONS: carrying, lifting, bending, standing, squatting, stairs, locomotion level, and caring for others  PARTICIPATION LIMITATIONS: meal prep, cleaning, interpersonal relationship, driving, shopping, community activity, and occupation  PERSONAL FACTORS: Age, Behavior pattern, Fitness, Past/current experiences, Social background, Time since onset of injury/illness/exacerbation, Transportation, and 3+ comorbidities: see above are also affecting patient's functional outcome.   REHAB POTENTIAL: Fair complexity of comorbidities   CLINICAL DECISION MAKING: Evolving/moderate complexity  EVALUATION COMPLEXITY: Moderate  PLAN:  PT FREQUENCY: 2x/week  PT DURATION: 12 weeks  PLANNED INTERVENTIONS: [id number]- PT Re-evaluation, 97750- Physical Performance Testing, 97110-Therapeutic exercises, 97530- Therapeutic activity, [id number]- Neuromuscular re-education, 97535- Self Care, [id number]- Manual therapy, [id number]- Gait training, [id number]- Orthotic Initial, [id number]- Prosthetic Initial , [phone]- Orthotic/Prosthetic subsequent, [phone]- Canalith repositioning, [id number]- Aquatic Therapy, [phone] (1-2 muscles), [id number] (3+ muscles)- Dry Needling, Patient/Family education, Balance training, Stair training, Vestibular training, Visual/preceptual remediation/compensation, DME instructions, Wheelchair mobility training, Cryotherapy, and Moist heat  PLAN FOR NEXT SESSION: sink HEP?, adjust knee resistance in app PRN, stand from higher mat to walker and attempt gait  with bari walker. Did great with BWS harness as fall precaution! (Just cumbersome)    Daved KATHEE Bull, PT, DPT  01/04/2024, 2:09 PM

## 2024-01-07 ENCOUNTER — Ambulatory Visit

## 2024-01-07 VITALS — BP 145/90 | HR 89

## 2024-01-07 DIAGNOSIS — M6281 Muscle weakness (generalized): Secondary | ICD-10-CM | POA: Diagnosis not present

## 2024-01-07 DIAGNOSIS — R293 Abnormal posture: Secondary | ICD-10-CM

## 2024-01-07 DIAGNOSIS — R262 Difficulty in walking, not elsewhere classified: Secondary | ICD-10-CM

## 2024-01-07 NOTE — Therapy (Addendum)
 OUTPATIENT PHYSICAL THERAPY NEURO TREATMENT   Patient Name: Kelli Hudson MRN: [id number] DOB:11/18/1990, 33 y.o., female Today's Date: 01/07/2024   PCP: Daphne Lesches, NP REFERRING PROVIDER: Jerona Sage, MD  END OF SESSION:  PT End of Session - 01/07/24 1456     Visit Number 12    Number of Visits 25    Date for Recertification  02/19/24    Authorization Type [employer] dual    Progress Note Due on Visit 10    PT Start Time 1401    PT Stop Time 1450    PT Time Calculation (min) 49 min    Equipment Utilized During Treatment Gait belt    Activity Tolerance Patient tolerated treatment well    Behavior During Therapy WFL for tasks assessed/performed            Past Medical History:  Diagnosis Date   Acute H. pylori gastric ulcer    Coffee ground emesis    Diabetes mellitus (HCC)    Diabetic gastroparesis (HCC)    DKA (diabetic ketoacidosis) (HCC) 02/24/2021   Gastroparesis    GERD (gastroesophageal reflux disease)    Hypertension    Hyperthyroidism    Intractable nausea and vomiting 04/20/2021   Normocytic anemia 04/20/2020   Prolonged Q-T interval on ECG    Past Surgical History:  Procedure Laterality Date   AMPUTATION Bilateral 10/24/2022   Procedure: BILATERAL BELOW KNEE AMPUTATION;  Surgeon: Sage Jerona GAILS, MD;  Location: [facility] OR;  Service: Orthopedics;  Laterality: Bilateral;   AMPUTATION Bilateral 10/08/2022   Procedure: BILATERAL LEG DEBRIDEMENT;  Surgeon: Sage Jerona GAILS, MD;  Location: [facility] OR;  Service: Orthopedics;  Laterality: Bilateral;   AMPUTATION Bilateral 11/14/2022   Procedure: BILATERAL ABOVE KNEE AMPUTATION;  Surgeon: Sage Jerona GAILS, MD;  Location: [facility] OR;  Service: Orthopedics;  Laterality: Bilateral;   AMPUTATION TOE Left 03/10/2018   Procedure: AMPUTATION FIFTH TOE;  Surgeon: Gershon Donnice SAUNDERS, DPM;  Location: [facility];  Service: Podiatry;  Laterality: Left;   APPLICATION OF WOUND VAC Bilateral 10/12/2022   Procedure:  APPLICATION OF WOUND VAC BILATERAL LEGS;  Surgeon: Celena Sharper, MD;  Location: [facility] OR;  Service: Orthopedics;  Laterality: Bilateral;   APPLICATION OF WOUND VAC Bilateral 10/24/2022   Procedure: APPLICATION OF WOUND VAC;  Surgeon: Sage Jerona GAILS, MD;  Location: [facility] OR;  Service: Orthopedics;  Laterality: Bilateral;   BIOPSY  01/28/2020   Procedure: BIOPSY;  Surgeon: Elicia Claw, MD;  Location: [facility];  Service: Gastroenterology;;   BOTOX  INJECTION  08/26/2021   Procedure: BOTOX  INJECTION;  Surgeon: Legrand Victory LITTIE DOUGLAS, MD;  Location: [facility];  Service: Gastroenterology;;   ESOPHAGOGASTRODUODENOSCOPY N/A 01/28/2020   Procedure: ESOPHAGOGASTRODUODENOSCOPY (EGD);  Surgeon: Elicia Claw, MD;  Location: [facility];  Service: Gastroenterology;  Laterality: N/A;   ESOPHAGOGASTRODUODENOSCOPY (EGD) WITH PROPOFOL  Left 09/08/2015   Procedure: ESOPHAGOGASTRODUODENOSCOPY (EGD) WITH PROPOFOL ;  Surgeon: Elsie Cree, MD;  Location: [facility] ENDOSCOPY;  Service: Endoscopy;  Laterality: Left;   ESOPHAGOGASTRODUODENOSCOPY (EGD) WITH PROPOFOL  N/A 08/26/2021   Procedure: ESOPHAGOGASTRODUODENOSCOPY (EGD) WITH PROPOFOL ;  Surgeon: Legrand Victory LITTIE DOUGLAS, MD;  Location: [facility];  Service: Gastroenterology;  Laterality: N/A;   GASTRIC STIMULATOR IMPLANT SURGERY  06/2022   at [facility]   I & D EXTREMITY Bilateral 10/12/2022   Procedure: IRRIGATION AND  DEBRIDEMENT  BILATERAL LEGS;  Surgeon: Celena Sharper, MD;  Location: [facility] OR;  Service: Orthopedics;  Laterality: Bilateral;   I & D EXTREMITY Right 10/13/2022   Procedure: RIGHT THIGH WOUND  VAC EXCHANGE;  Surgeon: Kendal Franky SQUIBB, MD;  Location: [facility] OR;  Service: Orthopedics;  Laterality: Right;   I & D EXTREMITY Bilateral 10/17/2022   Procedure: BILATERAL THIGH AND LEG DEBRIDEMENT, PARTIAL BILATERAL FOOT AMPUTATION;  Surgeon: Harden Jerona GAILS, MD;  Location: [facility] OR;  Service: Orthopedics;  Laterality: Bilateral;   I & D EXTREMITY Bilateral 11/05/2022   Procedure:  BILATERAL THIGH DEBRIDEMENT;  Surgeon: Harden Jerona GAILS, MD;  Location: [facility] OR;  Service: Orthopedics;  Laterality: Bilateral;   I & D EXTREMITY Bilateral 11/07/2022   Procedure: BILATERAL THIGH AND LEG DEBRIDEMENT;  Surgeon: Harden Jerona GAILS, MD;  Location: [facility] OR;  Service: Orthopedics;  Laterality: Bilateral;   I & D EXTREMITY Bilateral 11/12/2022   Procedure: BILATERAL LEG DEBRIDEMENTS;  Surgeon: Harden Jerona GAILS, MD;  Location: [facility] OR;  Service: Orthopedics;  Laterality: Bilateral;   IRRIGATION AND DEBRIDEMENT FOOT Bilateral 10/05/2022   Procedure: IRRIGATION AND DEBRIDEMENT FOOT;  Surgeon: Janit Thresa HERO, DPM;  Location: [facility];  Service: Orthopedics/Podiatry;  Laterality: Bilateral;   WISDOM TOOTH EXTRACTION     Patient Active Problem List   Diagnosis Date Noted   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   Hypokalemia 11/27/2022   Effusion, left knee 11/05/2022   MDD (major depressive disorder), recurrent episode, moderate (HCC) 10/16/2022   Necrotizing fasciitis of pelvic region and thigh (HCC) 10/08/2022   Necrotizing fasciitis of lower leg (HCC) 10/08/2022   Necrotizing fasciitis (HCC) 10/08/2022   Sepsis due to cellulitis (HCC) 10/03/2022   Cellulitis of lower extremity 10/03/2022   Multinodular goiter 06/18/2022   Malnutrition of moderate degree 04/18/2022   Intractable vomiting with nausea 04/17/2022   DKA (diabetic ketoacidosis) (HCC) 03/04/2022   Nausea vomiting and diarrhea 12/25/2021   Diabetic gastroparesis (HCC) 10/09/2021   Drug-seeking behavior  09/21/2021   Essential hypertension 08/25/2021   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) 08/25/2021   GERD without esophagitis 08/25/2021   Hematemesis    Prolonged Q-T interval on ECG    Nausea and vomiting 07/20/2021   Anemia of chronic disease 06/06/2021   Dehydration 06/04/2021   Hypomagnesemia 04/21/2021   Ulcerated, foot, left, with fat layer exposed (HCC) 04/20/2021   Uncontrolled type 1 diabetes mellitus with hyperglycemia, with long-term current use of insulin  (HCC) 12/18/2020   DM type 1 (diabetes mellitus, type 1) (HCC) 12/18/2020   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 11/09/2020   Diabetic retinopathy of both eyes associated with type 1 diabetes mellitus (HCC) 09/24/2020   Diabetic gastroparesis associated with type 1 diabetes mellitus (HCC) 08/12/2020   Acute kidney injury superimposed on chronic kidney disease 07/25/2020   Generalized abdominal pain 07/23/2020   GERD (gastroesophageal reflux disease) 07/23/2020   Diabetic nephropathy associated with type 1 diabetes mellitus (HCC) 06/27/2020   Sepsis (HCC) 05/27/2020   HLD (hyperlipidemia) 05/23/2020   Sinus tachycardia 05/09/2020   Anemia 04/20/2020   Depression with anxiety 07/05/2019   Diabetic polyneuropathy associated with type 1 diabetes mellitus (HCC) 09/24/2017   Gastroparesis    Goiter 10/05/2009   DM2 (  diabetes mellitus, type 2) (HCC) 10/05/2009   Hypertension associated with diabetes (HCC) 10/05/2009    ONSET DATE: 11/02/23 referral   REFERRING DIAG: [phone] (ICD-10-CM) - S/P AKA (above knee amputation) bilateral (HCC)   THERAPY DIAG:  Muscle weakness (generalized)  Abnormal posture  Difficulty in walking, not elsewhere classified  Rationale for Evaluation and Treatment: Rehabilitation  SUBJECTIVE:                                                                                                                                                                                              SUBJECTIVE STATEMENT: Patient arrives to clinic in [facility] with prosthetics donned and accompanied by significant other, Drew. Prosthetics were donned. She has been wearing shrinkers. Chair was heightened this morning with home visit from [employer]. WCnow sits at 21 inches for seat height. Patient denies falls.  Pt accompanied by: significant other  PERTINENT HISTORY: DM, diabetic gastroparesis, GERD, HTN, hyperthyroidism, normocytic anemia, B AKA  PAIN:  Are you having pain? No - reports pain in left groin region w/ pressure from socket  PRECAUTIONS: Fall   PATIENT GOALS: to learn how to walk with new prosthetics                                                                                                                             TREATMENT   Therapeutic Activity: STS x 8 attempts (4 successful STS) from 21 seat height of WC w/ +2 w/mod assistance each and bariatric front wheeled rolling walker Cued for hip extension during standing and anterior weight shifting for trunk flexion during sit to stand transfer, min assistx2 needed with static standing  Gait Training: Overground training x87ft, forward gait with 3 turns, with bariatric front wheeled rolling walker x2 w/mod assistance each and wheelchair follow x2 mod assistance for sit to stand, min assist during ambulation, knee extension assist to standing and cueing hip extension, toe off for knee flexion in terminal stance, cueing for feet repositioning during turns as BOS narrowed with turns 3 seated rest breaks needed throughout due to fatigue, UE  fatigue and increased rate of breathing that subsided with seated rest and cold water  Internal rotational of L prosthetic and L toeing in during L toe off with possible influence from some rotation of L prosthetic. Continue to problem shoot L rotation from prosthetic or toeing in - normal toeing in? Is there influence of some rotation from prosthetic or too much space  in L prosthetic  PATIENT EDUCATION: Education details: Continue HEP, discussion of increases socks on L, wearing shrinker Person educated: Patient and SO Education method: Medical Illustrator Education comprehension: verbalized understanding and needs further education  HOME EXERCISE PROGRAM: -practice donning/doffing prosthetics as much as possible   GOALS: Goals reviewed with patient? Yes  SHORT TERM GOALS: Target date: 12/25/23  Pt will be independent with initial HEP for improved functional mobility and independence with new prosthetics  Baseline: to be provided Goal status: INITIAL  2.  Patient will complete sit <> stand with LRAD and no more than MinA consistently  Baseline: up to MaxA in // bars Goal status: INITIAL  3.  Patient will initiate ambulation with an ambulatory device with assist as needed Baseline: only able to ambulate in // bars Goal status: REVISED  4.  Patient will be able to don/doff B prosthetics with no more than MinA  Baseline: MaxA  Goal status: INITIAL  LONG TERM GOALS: Target date: 02/19/24  Pt will be independent with final HEP for improved functional mobility and independence with new prosthetics  Baseline: to be provided Goal status: INITIAL  2.  Patient will complete sit <> stand with LRAD with no more than supervision consistently  Baseline: MaxA in // bars  Goal status: INITIAL  3.  Patient will ambulate >/=12ft with ambulatory device and no more than MinA Baseline: unable-only ambulating in // bars Goal status: REVISED  4.  Patient will traverse at least 1 community obstacle (curb vs ramp) with LRAD and no more than CGA Baseline: unable on eval  Goal status: INITIAL  5.  Patient will don/doff B prosthetics independently  Baseline: MaxA Goal status: INITIAL   ASSESSMENT:  CLINICAL IMPRESSION: Patient seen for skilled physical therapy today with emphasis on sit to stand transfers from lower seat height and gait  training. Focus of skilled session today on improving transfer mechanics from 21 inch seat height WC to advance towards functional transfers. Session emphasis on gait training with front wheeled rolling walker with greatest distance completed in session to date at 10ft with 3 turns and seated rest breaks throughout. Patient continues to need moderate assistance with sit to stand and advancing gait with minimal assist and some guidance with manual knee extension during sit to stand transfers. Continued cueing needed for toe off during terminal stance as there is an internal rotational force from toe off on L LE and possible influence from rotation of L prosthetic. Patient continues to benefit from skilled PT to improve gait mechanics and maximized use of MPK features. Continue POC.  OBJECTIVE IMPAIRMENTS: Abnormal gait, cardiopulmonary status limiting activity, decreased activity tolerance, decreased balance, decreased endurance, decreased knowledge of use of DME, decreased mobility, difficulty walking, decreased strength, impaired vision/preception, and prosthetic dependency .   ACTIVITY LIMITATIONS: carrying, lifting, bending, standing, squatting, stairs, locomotion level, and caring for others  PARTICIPATION LIMITATIONS: meal prep, cleaning, interpersonal relationship, driving, shopping, community activity, and occupation  PERSONAL FACTORS: Age, Behavior pattern, Fitness, Past/current experiences, Social background, Time since onset of injury/illness/exacerbation, Transportation, and 3+ comorbidities: see above are also affecting patient's functional outcome.  REHAB POTENTIAL: Fair complexity of comorbidities   CLINICAL DECISION MAKING: Evolving/moderate complexity  EVALUATION COMPLEXITY: Moderate  PLAN:  PT FREQUENCY: 2x/week  PT DURATION: 12 weeks  PLANNED INTERVENTIONS: [id number]- PT Re-evaluation, 97750- Physical Performance Testing, 97110-Therapeutic exercises, 97530- Therapeutic activity,  [id number]- Neuromuscular re-education, 97535- Self Care, [id number]- Manual therapy, [id number]- Gait training, [phone]- Orthotic Initial, [id number]- Prosthetic Initial , [phone]- Orthotic/Prosthetic subsequent, [phone]- Canalith repositioning, [id number]- Aquatic Therapy, [phone] (1-2 muscles), [id number] (3+ muscles)- Dry Needling, Patient/Family education, Balance training, Stair training, Vestibular training, Visual/preceptual remediation/compensation, DME instructions, Wheelchair mobility training, Cryotherapy, and Moist heat  PLAN FOR NEXT SESSION: sink HEP practice, Continue to problem shoot L rotation from prosthetic or toeing in - normal toeing in? Or influence of some rotation from prosthetic? Need more socks? Needs STGs technically checked  Emmalene Sherry, Student-PT, DPT  01/07/2024, 2:57 PM

## 2024-01-08 ENCOUNTER — Ambulatory Visit (HOSPITAL_COMMUNITY)
Admission: RE | Admit: 2024-01-08 | Discharge: 2024-01-08 | Disposition: A | Discharge status: Home / Self Care | Source: Ambulatory Visit | Attending: Nephrology | Admitting: Nephrology

## 2024-01-08 DIAGNOSIS — N184 Chronic kidney disease, stage 4 (severe): Secondary | ICD-10-CM | POA: Insufficient documentation

## 2024-01-08 DIAGNOSIS — D631 Anemia in chronic kidney disease: Secondary | ICD-10-CM | POA: Diagnosis present

## 2024-01-08 LAB — POCT HEMOGLOBIN-HEMACUE: Hemoglobin: 9.6 g/dL — ABNORMAL LOW (ref 12.0–15.0)

## 2024-01-08 MED ORDER — EPOETIN ALFA-EPBX 10000 UNIT/ML IJ SOLN
10000.0000 [IU] | Freq: Once | INTRAMUSCULAR | Status: AC
  Administered 2024-01-08: 10000 [IU] via SUBCUTANEOUS

## 2024-01-08 MED ORDER — EPOETIN ALFA-EPBX 10000 UNIT/ML IJ SOLN
INTRAMUSCULAR | Status: AC
  Filled 2024-01-08: qty 1

## 2024-01-11 ENCOUNTER — Ambulatory Visit: Admitting: Physical Therapy

## 2024-01-11 ENCOUNTER — Encounter: Payer: Self-pay | Admitting: Physical Therapy

## 2024-01-11 DIAGNOSIS — M6281 Muscle weakness (generalized): Secondary | ICD-10-CM

## 2024-01-11 DIAGNOSIS — R2689 Other abnormalities of gait and mobility: Secondary | ICD-10-CM

## 2024-01-11 DIAGNOSIS — R29898 Other symptoms and signs involving the musculoskeletal system: Secondary | ICD-10-CM

## 2024-01-11 DIAGNOSIS — R262 Difficulty in walking, not elsewhere classified: Secondary | ICD-10-CM

## 2024-01-11 DIAGNOSIS — R293 Abnormal posture: Secondary | ICD-10-CM

## 2024-01-11 NOTE — Therapy (Signed)
 OUTPATIENT PHYSICAL THERAPY NEURO TREATMENT   Patient Name: Kelli Hudson MRN: [id number] DOB:Mar 27, 1990, 33 y.o., female Today's Date: 01/11/2024   PCP: Daphne Lesches, NP REFERRING PROVIDER: Jerona Sage, MD  END OF SESSION:  PT End of Session - 01/11/24 1322     Visit Number 13    Number of Visits 25    Date for Recertification  02/19/24    Authorization Type [employer] dual    Progress Note Due on Visit 10    PT Start Time 1318    PT Stop Time 1407    PT Time Calculation (min) 49 min    Equipment Utilized During Treatment Gait belt    Activity Tolerance Patient tolerated treatment well    Behavior During Therapy WFL for tasks assessed/performed            Past Medical History:  Diagnosis Date   Acute H. pylori gastric ulcer    Coffee ground emesis    Diabetes mellitus (HCC)    Diabetic gastroparesis (HCC)    DKA (diabetic ketoacidosis) (HCC) 02/24/2021   Gastroparesis    GERD (gastroesophageal reflux disease)    Hypertension    Hyperthyroidism    Intractable nausea and vomiting 04/20/2021   Normocytic anemia 04/20/2020   Prolonged Q-T interval on ECG    Past Surgical History:  Procedure Laterality Date   AMPUTATION Bilateral 10/24/2022   Procedure: BILATERAL BELOW KNEE AMPUTATION;  Surgeon: Sage Jerona GAILS, MD;  Location: [facility] OR;  Service: Orthopedics;  Laterality: Bilateral;   AMPUTATION Bilateral 10/08/2022   Procedure: BILATERAL LEG DEBRIDEMENT;  Surgeon: Sage Jerona GAILS, MD;  Location: [facility] OR;  Service: Orthopedics;  Laterality: Bilateral;   AMPUTATION Bilateral 11/14/2022   Procedure: BILATERAL ABOVE KNEE AMPUTATION;  Surgeon: Sage Jerona GAILS, MD;  Location: [facility] OR;  Service: Orthopedics;  Laterality: Bilateral;   AMPUTATION TOE Left 03/10/2018   Procedure: AMPUTATION FIFTH TOE;  Surgeon: Gershon Donnice SAUNDERS, DPM;  Location: [facility];  Service: Podiatry;  Laterality: Left;   APPLICATION OF WOUND VAC Bilateral 10/12/2022   Procedure:  APPLICATION OF WOUND VAC BILATERAL LEGS;  Surgeon: Celena Sharper, MD;  Location: [facility] OR;  Service: Orthopedics;  Laterality: Bilateral;   APPLICATION OF WOUND VAC Bilateral 10/24/2022   Procedure: APPLICATION OF WOUND VAC;  Surgeon: Sage Jerona GAILS, MD;  Location: [facility] OR;  Service: Orthopedics;  Laterality: Bilateral;   BIOPSY  01/28/2020   Procedure: BIOPSY;  Surgeon: Elicia Claw, MD;  Location: [facility];  Service: Gastroenterology;;   BOTOX  INJECTION  08/26/2021   Procedure: BOTOX  INJECTION;  Surgeon: Legrand Victory LITTIE DOUGLAS, MD;  Location: [facility];  Service: Gastroenterology;;   ESOPHAGOGASTRODUODENOSCOPY N/A 01/28/2020   Procedure: ESOPHAGOGASTRODUODENOSCOPY (EGD);  Surgeon: Elicia Claw, MD;  Location: [facility];  Service: Gastroenterology;  Laterality: N/A;   ESOPHAGOGASTRODUODENOSCOPY (EGD) WITH PROPOFOL  Left 09/08/2015   Procedure: ESOPHAGOGASTRODUODENOSCOPY (EGD) WITH PROPOFOL ;  Surgeon: Elsie Cree, MD;  Location: [facility] END[facility]COPY;  Service: Endoscopy;  Laterality: Left;   ESOPHAGOGASTRODUODENOSCOPY (EGD) WITH PROPOFOL  N/A 08/26/2021   Procedure: ESOPHAGOGASTRODUODENOSCOPY (EGD) WITH PROPOFOL ;  Surgeon: Legrand Victory LITTIE DOUGLAS, MD;  Location: [facility];  Service: Gastroenterology;  Laterality: N/A;   GASTRIC STIMULATOR IMPLANT SURGERY  06/2022   at [facility]   I & D EXTREMITY Bilateral 10/12/2022   Procedure: IRRIGATION AND  DEBRIDEMENT  BILATERAL LEGS;  Surgeon: Celena Sharper, MD;  Location: [facility] OR;  Service: Orthopedics;  Laterality: Bilateral;   I & D EXTREMITY Right 10/13/2022   Procedure: RIGHT THIGH WOUND  VAC EXCHANGE;  Surgeon: Kendal Franky SQUIBB, MD;  Location: [facility] OR;  Service: Orthopedics;  Laterality: Right;   I & D EXTREMITY Bilateral 10/17/2022   Procedure: BILATERAL THIGH AND LEG DEBRIDEMENT, PARTIAL BILATERAL FOOT AMPUTATION;  Surgeon: Harden Jerona GAILS, MD;  Location: [facility] OR;  Service: Orthopedics;  Laterality: Bilateral;   I & D EXTREMITY Bilateral 11/05/2022   Procedure:  BILATERAL THIGH DEBRIDEMENT;  Surgeon: Harden Jerona GAILS, MD;  Location: [facility] OR;  Service: Orthopedics;  Laterality: Bilateral;   I & D EXTREMITY Bilateral 11/07/2022   Procedure: BILATERAL THIGH AND LEG DEBRIDEMENT;  Surgeon: Harden Jerona GAILS, MD;  Location: [facility];  Service: Orthopedics;  Laterality: Bilateral;   I & D EXTREMITY Bilateral 11/12/2022   Procedure: BILATERAL LEG DEBRIDEMENTS;  Surgeon: Harden Jerona GAILS, MD;  Location: [facility] OR;  Service: Orthopedics;  Laterality: Bilateral;   IRRIGATION AND DEBRIDEMENT FOOT Bilateral 10/05/2022   Procedure: IRRIGATION AND DEBRIDEMENT FOOT;  Surgeon: Janit Thresa HERO, DPM;  Location: [facility] ORS;  Service: Orthopedics/Podiatry;  Laterality: Bilateral;   WISDOM TOOTH EXTRACTION     Patient Active Problem List   Diagnosis Date Noted   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   Hypokalemia 11/27/2022   Effusion, left knee 11/05/2022   MDD (major depressive disorder), recurrent episode, moderate (HCC) 10/16/2022   Necrotizing fasciitis of pelvic region and thigh (HCC) 10/08/2022   Necrotizing fasciitis of lower leg (HCC) 10/08/2022   Necrotizing fasciitis (HCC) 10/08/2022   Sepsis due to cellulitis (HCC) 10/03/2022   Cellulitis of lower extremity 10/03/2022   Multinodular goiter 06/18/2022   Malnutrition of moderate degree 04/18/2022   Intractable vomiting with nausea 04/17/2022   DKA (diabetic ketoacidosis) (HCC) 03/04/2022   Nausea vomiting and diarrhea 12/25/2021   Diabetic gastroparesis (HCC) 10/09/2021   Drug-seeking behavior  09/21/2021   Essential hypertension 08/25/2021   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) 08/25/2021   GERD without esophagitis 08/25/2021   Hematemesis    Prolonged Q-T interval on ECG    Nausea and vomiting 07/20/2021   Anemia of chronic disease 06/06/2021   Dehydration 06/04/2021   Hypomagnesemia 04/21/2021   Ulcerated, foot, left, with fat layer exposed (HCC) 04/20/2021   Uncontrolled type 1 diabetes mellitus with hyperglycemia, with long-term current use of insulin  (HCC) 12/18/2020   DM type 1 (diabetes mellitus, type 1) (HCC) 12/18/2020   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 11/09/2020   Diabetic retinopathy of both eyes associated with type 1 diabetes mellitus (HCC) 09/24/2020   Diabetic gastroparesis associated with type 1 diabetes mellitus (HCC) 08/12/2020   Acute kidney injury superimposed on chronic kidney disease 07/25/2020   Generalized abdominal pain 07/23/2020   GERD (gastroesophageal reflux disease) 07/23/2020   Diabetic nephropathy associated with type 1 diabetes mellitus (HCC) 06/27/2020   Sepsis (HCC) 05/27/2020   HLD (hyperlipidemia) 05/23/2020   Sinus tachycardia 05/09/2020   Anemia 04/20/2020   Depression with anxiety 07/05/2019   Diabetic polyneuropathy associated with type 1 diabetes mellitus (HCC) 09/24/2017   Gastroparesis    Goiter 10/05/2009   DM2 (  diabetes mellitus, type 2) (HCC) 10/05/2009   Hypertension associated with diabetes (HCC) 10/05/2009    ONSET DATE: 11/02/23 referral   REFERRING DIAG: [phone] (ICD-10-CM) - S/P AKA (above knee amputation) bilateral (HCC)   THERAPY DIAG:  Muscle weakness (generalized)  Abnormal posture  Difficulty in walking, not elsewhere classified  Other abnormalities of gait and mobility  Other symptoms and signs involving the musculoskeletal system  Rationale for Evaluation and Treatment: Rehabilitation  SUBJECTIVE:                                                                                                                                                                                              SUBJECTIVE STATEMENT: Patient arrives to clinic in [facility] with prosthetics donned and accompanied by significant other, Drew.  WC sits at 21 inches for seat height. Patient denies falls.  Blood glucose 150 at current.  Pt accompanied by: significant other  PERTINENT HISTORY: DM, diabetic gastroparesis, GERD, HTN, hyperthyroidism, normocytic anemia, B AKA  PAIN:  Are you having pain? No - reports pain in left groin region w/ pressure from socket  PRECAUTIONS: Fall   PATIENT GOALS: to learn how to walk with new prosthetics                                                                                                                             TREATMENT   TA/Gait: Wheelchair pushup vertically x5 vs anterior bias x5 > Attempted to stand x4 progressing to stand maxA x1 w/ pull-to-stand on bariatric walker Overground training x7ft (large right turn) + 13 ft (another large right turn) + 30 ft (straightaway) No more than minA during ambulation, ongoing cues for width of BOS and stride during turns; mild in-toeing of LLE less interference during right turns today with pt better able to visualize turns and foot placement as vision some improved today Pt stands w/ less assist when PT facilitating left knee extension and pulling anteriorly at superior oriented gait belt Pt greatly fatigued with turns  PATIENT EDUCATION: Education details: Continue HEP, discussion of increased socks on  L, wearing shrinker; discussed maybe establishing a standing program if she can get down to +1 assist w/ pull-to-stand or other stand technique by the time she is in her new apt in December.  Discussed ways to stretch hip flexors and work glutes with bed positioning and basic exercises to better promote hip extension when in standing.  Progress towards STGs. Person educated: Patient and  SO Education method: Medical Illustrator Education comprehension: verbalized understanding and needs further education  HOME EXERCISE PROGRAM: -practice donning/doffing prosthetics as much as possible   GOALS: Goals reviewed with patient? Yes  SHORT TERM GOALS: Target date: 12/25/23  Pt will be independent with initial HEP for improved functional mobility and independence with new prosthetics  Baseline: needs to be updated - pt progressing to safety w/ standing program at home (11/24) Goal status: IN PROGRESS  2.  Patient will complete sit <> stand with LRAD and no more than MinA consistently  Baseline: up to MaxA in // bars; maxA to bariatric walker (11/24) Goal status: IN PROGRESS  3.  Patient will initiate ambulation with an ambulatory device with assist as needed Baseline: only able to ambulate in // bars; ambulates x12 ft w/ CGA and w/c follow (11/24) Goal status: MET  4.  Patient will be able to don/doff B prosthetics with no more than MinA  Baseline: MaxA; IND (11/24) Goal status: MET  LONG TERM GOALS: Target date: 02/19/24  Pt will be independent with final HEP for improved functional mobility and independence with new prosthetics  Baseline: to be provided Goal status: INITIAL  2.  Patient will complete sit <> stand with LRAD with no more than supervision consistently  Baseline: MaxA in // bars  Goal status: INITIAL  3.  Patient will ambulate >/=17ft with ambulatory device and no more than MinA Baseline: unable-only ambulating in // bars Goal status: REVISED  4.  Patient will traverse at least 1 community obstacle (curb vs ramp) with LRAD and no more than CGA Baseline: unable on eval  Goal status: INITIAL  5.  Patient will don/doff B prosthetics independently  Baseline: MaxA Goal status: INITIAL   ASSESSMENT:  CLINICAL IMPRESSION: Focus of skilled PT session on ongoing gait training for endurance and stride mechanics.  She is making remarkable  progress with STS from 21 height with goal of progressing to single person assist so she can further practice at sink at home.  Required up to maxA to stand today w/ second person steadying bariatric walker.  Her vision helped her better adjust the 2WW during turning tasks.  Some difficulty achieving auditory feedback of the L MPK as prosthetic continues to toe in despite lanyard and sock adjustments.  Will continue to work on pt self correction as able. Continue POC.  OBJECTIVE IMPAIRMENTS: Abnormal gait, cardiopulmonary status limiting activity, decreased activity tolerance, decreased balance, decreased endurance, decreased knowledge of use of DME, decreased mobility, difficulty walking, decreased strength, impaired vision/preception, and prosthetic dependency .   ACTIVITY LIMITATIONS: carrying, lifting, bending, standing, squatting, stairs, locomotion level, and caring for others  PARTICIPATION LIMITATIONS: meal prep, cleaning, interpersonal relationship, driving, shopping, community activity, and occupation  PERSONAL FACTORS: Age, Behavior pattern, Fitness, Past/current experiences, Social background, Time since onset of injury/illness/exacerbation, Transportation, and 3+ comorbidities: see above are also affecting patient's functional outcome.   REHAB POTENTIAL: Fair complexity of comorbidities   CLINICAL DECISION MAKING: Evolving/moderate complexity  EVALUATION COMPLEXITY: Moderate  PLAN:  PT FREQUENCY: 2x/week  PT DURATION: 12 weeks  PLANNED INTERVENTIONS: 02835-  PT Re-evaluation, 97750- Physical Performance Testing, 97110-Therapeutic exercises, 97530- Therapeutic activity, [id number]- Neuromuscular re-education, 97535- Self Care, [id number]- Manual therapy, [id number]- Gait training, [phone]- Orthotic Initial, [phone]- Prosthetic Initial , [phone]- Orthotic/Prosthetic subsequent, [phone]- Canalith repositioning, [phone]- Aquatic Therapy, [phone] (1-2 muscles), [phone] (3+ muscles)- Dry Needling, Patient/Family  education, Balance training, Stair training, Vestibular training, Visual/preceptual remediation/compensation, DME instructions, Wheelchair mobility training, Cryotherapy, and Moist heat  PLAN FOR NEXT SESSION: sink HEP practice, Continue to problem shoot L rotation from prosthetic or toeing in - normal toeing in? Or influence of some rotation from prosthetic? Need more socks?  Practice straight pathways for endurance and add in turns for increased challenge.  Daved KATHEE Bull, PT, DPT  01/11/2024, 5:01 PM

## 2024-01-18 ENCOUNTER — Ambulatory Visit: Admitting: Surgery

## 2024-01-18 ENCOUNTER — Ambulatory Visit (HOSPITAL_COMMUNITY)
Admission: RE | Admit: 2024-01-18 | Discharge: 2024-01-18 | Disposition: A | Discharge status: Home / Self Care | Source: Ambulatory Visit | Attending: Surgery | Admitting: Surgery

## 2024-01-18 ENCOUNTER — Ambulatory Visit: Attending: Orthopedic Surgery

## 2024-01-18 ENCOUNTER — Encounter: Payer: Self-pay | Admitting: Surgery

## 2024-01-18 ENCOUNTER — Ambulatory Visit (HOSPITAL_COMMUNITY)
Admission: RE | Admit: 2024-01-18 | Discharge: 2024-01-18 | Disposition: A | Source: Ambulatory Visit | Attending: Surgery | Admitting: Surgery

## 2024-01-18 VITALS — BP 144/94 | HR 92

## 2024-01-18 VITALS — BP 145/94 | HR 87 | Temp 97.8°F

## 2024-01-18 DIAGNOSIS — R29898 Other symptoms and signs involving the musculoskeletal system: Secondary | ICD-10-CM | POA: Diagnosis present

## 2024-01-18 DIAGNOSIS — R293 Abnormal posture: Secondary | ICD-10-CM | POA: Insufficient documentation

## 2024-01-18 DIAGNOSIS — R262 Difficulty in walking, not elsewhere classified: Secondary | ICD-10-CM | POA: Diagnosis present

## 2024-01-18 DIAGNOSIS — M6281 Muscle weakness (generalized): Secondary | ICD-10-CM | POA: Insufficient documentation

## 2024-01-18 DIAGNOSIS — N2889 Other specified disorders of kidney and ureter: Secondary | ICD-10-CM | POA: Insufficient documentation

## 2024-01-18 DIAGNOSIS — R2689 Other abnormalities of gait and mobility: Secondary | ICD-10-CM | POA: Insufficient documentation

## 2024-01-18 DIAGNOSIS — N186 End stage renal disease: Secondary | ICD-10-CM | POA: Diagnosis present

## 2024-01-18 NOTE — Progress Notes (Signed)
 [facility] [place]  Patient name: Kelli Hudson MRN: [id number] DOB: 1991/01/13 Sex: female   REQUESTING PROVIDER:    Dr. Melia   REASON FOR CONSULT:    Access   HISTORY OF PRESENT ILLNESS:   Kelli Hudson is a 33 y.o. female, who is referred for dialysis access.  She is right-handed.  She denies any history of pacemaker or defibrillator.  Her renal failure secondary to diabetes and hypertension.  She is not yet on dialysis.  She is status post bilateral above-knee amputation  PAST MEDICAL HISTORY    Past Medical History:  Diagnosis Date   Acute H. pylori gastric ulcer    Chronic kidney disease    Coffee ground emesis    Diabetes mellitus (HCC)    Diabetic gastroparesis (HCC)    DKA (diabetic ketoacidosis) (HCC) 02/24/2021   Gastroparesis    GERD (gastroesophageal reflux disease)    Hypertension    Hyperthyroidism    Intractable nausea and vomiting 04/20/2021   Normocytic anemia 04/20/2020   Prolonged Q-T interval on ECG      FAMILY HISTORY   Family History  Problem Relation Age of Onset   Lung cancer Mother    Diabetes Mother    Bipolar disorder Father    Liver cancer Maternal Grandfather    Diabetes Maternal Aunt        x 2   Pancreatic cancer Maternal Uncle    Prostate cancer Maternal Uncle    Breast cancer Other        maternal great aunt   Heart disease Other    Ovarian cancer Other        maternal great aunt   Kidney disease Other        maternal great aunt   Colon cancer Neg Hx    Stomach cancer Neg Hx     SOCIAL HISTORY:   Social History   Socioeconomic History   Marital status: Single    Spouse name: Not on file   Number of children: 0   Years of education: Not on file   Highest education level: Not on file  Occupational History   Occupation: Financial risk analyst  Tobacco Use   Smoking status: Never   Smokeless tobacco: Never  Vaping Use   Vaping  status: Never Used  Substance and Sexual Activity   Alcohol use: Yes    Alcohol/week: 1.0 standard drink of alcohol    Types: 1 Shots of liquor per week    Comment: occasional    Drug use: No   Sexual activity: Yes    Birth control/protection: Condom, Implant    Comment: Nexplanon   Other Topics Concern   Not on file  Social History Narrative   Not on file   Social Drivers of Health   Financial Resource Strain: Low Risk  (02/13/2022)   Received from [facility]   Overall Financial Resource Strain (CARDIA)    Difficulty of Paying Living Expenses: Not hard at all  Food Insecurity: Low Risk  (07/09/2023)   Received from [facility]   Hunger Vital Sign    Within the past 12 months, you worried that your food would run out before you got money to buy more: Never true    Within the past 12 months, the food you bought just didn't last and you didn't have money to get more. : Never true  Transportation Needs: No Transportation Needs (07/09/2023)   Received from [facility]  In the past 12 months, has lack of reliable transportation kept you from medical appointments, meetings, work or from getting things needed for daily living? : No  Physical Activity: Not on file  Stress: No Stress Concern Present (12/29/2022)   Received from [facility] - Occupational Stress Questionnaire    Feeling of Stress : Not at all  Social Connections: Moderately Integrated (02/21/2023)   Social Connection and Isolation Panel    Frequency of Communication with Friends and Family: More than three times a week    Frequency of Social Gatherings with Friends and Family: Once a week    Attends Religious Services: More than 4 times per year    Active Member of [employer] or Organizations: Yes    Attends Engineer, Structural: More than 4 times per year    Marital Status: Divorced  Intimate Partner Violence: Not At Risk (04/06/2023)    Humiliation, Afraid, Rape, and Kick questionnaire    Fear of Current or Ex-Partner: No    Emotionally Abused: No    Physically Abused: No    Sexually Abused: No    ALLERGIES:    Allergies  Allergen Reactions   Ibuprofen  Other (See Comments)    07/23/22 - Worsening kidney function after 3 doses of ibuprofen  400mg .  Creatinine increased from 1.3 to 2.  Creatinine returned to normal after stopping.   Lisinopril  Cough, Other (See Comments), Itching and Rash    Breakout in rash    CURRENT MEDICATIONS:    Current Outpatient Medications  Medication Sig Dispense Refill   acetaminophen  (TYLENOL ) 500 MG tablet Take 1 tablet (500 mg total) by mouth every 6 (six) hours as needed. 30 tablet 0   amLODipine  (NORVASC ) 10 MG tablet Take 1 tablet (10 mg total) by mouth daily.     amoxicillin -clavulanate (AUGMENTIN ) 875-125 MG tablet Take 1 tablet by mouth every 12 (twelve) hours. 14 tablet 0   ascorbic acid  (VITAMIN C ) 1000 MG tablet Take 1 tablet (1,000 mg total) by mouth daily. (Patient taking differently: Take 1,000 mg by mouth once a week.) 30 tablet 0   benzonatate  (TESSALON ) 100 MG capsule Take 1 capsule (100 mg total) by mouth every 8 (eight) hours. 21 capsule 0   carvedilol  (COREG ) 6.25 MG tablet Take 1 tablet (6.25 mg total) by mouth 2 (two) times daily with a meal.     Continuous Glucose Transmitter (DEXCOM G6 TRANSMITTER) MISC 1 Device by Does not apply route as directed. 1 each 2   etonogestrel  (NEXPLANON ) 68 MG IMPL implant 1 each by Subdermal route continuous.     famotidine  (PEPCID ) 20 MG tablet Take 1 tablet (20 mg total) by mouth 2 (two) times daily.     gabapentin  (NEURONTIN ) 300 MG capsule Take 1 capsule (300 mg total) by mouth every 8 (eight) hours. 90 capsule 0   hydrOXYzine  (ATARAX ) 10 MG tablet Take 1 tablet (10 mg total) by mouth 3 (three) times daily as needed for anxiety. 30 tablet 0   insulin  aspart (NOVOLOG ) 100 UNIT/ML injection Inject 0-9 Units into the skin 3 (three)  times daily with meals. (Patient taking differently: Inject 10-30 Units into the skin 3 (three) times daily with meals.)     insulin  degludec (TRESIBA  FLEXTOUCH) 200 UNIT/ML FlexTouch Pen Inject 15 Units into the skin 2 (two) times daily. If CBG is > 150     Insulin  Pen Needle 32G X 4 MM MISC 1 Device by Does not apply route  in the morning, at noon, in the evening, and at bedtime. 400 each 2   MAGNESIUM  PO Take 1 tablet by mouth daily.     methocarbamol  (ROBAXIN ) 500 MG tablet Take 500 mg by mouth 3 (three) times daily as needed for muscle spasms.     metoCLOPramide  (REGLAN ) 10 MG tablet Take 1 tablet (10 mg total) by mouth every 6 (six) hours. 30 tablet 0   Multiple Vitamin (MULTIVITAMIN WITH MINERALS) TABS tablet Take 1 tablet by mouth daily. 30 tablet 0   oxyCODONE  (ROXICODONE ) 5 MG immediate release tablet Take 1 tablet (5 mg total) by mouth every 4 (four) hours as needed for severe pain (pain score 7-10). 10 tablet 0   potassium chloride  SA (KLOR-CON  M) 20 MEQ tablet Take 20 mEq by mouth daily.     spironolactone (ALDACTONE) 25 MG tablet Take 25 mg by mouth 2 (two) times daily.     traZODone  (DESYREL ) 150 MG tablet Take 150 mg by mouth at bedtime.     No current facility-administered medications for this visit.    REVIEW OF SYSTEMS:   [X]  denotes positive finding, [ ]  denotes negative finding Cardiac  Comments:  Chest pain or chest pressure:    Shortness of breath upon exertion:    Short of breath when lying flat:    Irregular heart rhythm:        Vascular    Pain in calf, thigh, or hip brought on by ambulation:    Pain in feet at night that wakes you up from your sleep:     Blood clot in your veins:    Leg swelling:         Pulmonary    Oxygen at home:    Productive cough:     Wheezing:         Neurologic    Sudden weakness in arms or legs:     Sudden numbness in arms or legs:     Sudden onset of difficulty speaking or slurred speech:    Temporary loss of vision in one  eye:     Problems with dizziness:         Gastrointestinal    Blood in stool:      Vomited blood:         Genitourinary    Burning when urinating:     Blood in urine:        Psychiatric    Major depression:         Hematologic    Bleeding problems:    Problems with blood clotting too easily:        Skin    Rashes or ulcers:        Constitutional    Fever or chills:     PHYSICAL EXAM:   Vitals:   01/18/24 1142  BP: (!) 145/94  Pulse: 87  Temp: 97.8 F (36.6 C)  SpO2: 93%    GENERAL: The patient is a well-nourished female, in no acute distress. The vital signs are documented above. CARDIAC: There is a regular rate and rhythm.  VASCULAR: Palpable left brachial and radial pulse PULMONARY: Nonlabored respirations SKIN: There are no ulcers or rashes noted. PSYCHIATRIC: The patient has a normal affect.  STUDIES:   I have reviewed the following: +-----------------+-------------+----------+--------+  Right Cephalic   Diameter (cm)Depth (cm)Findings  +-----------------+-------------+----------+--------+  Prox upper arm       0.22                         +-----------------+-------------+----------+--------+  Mid upper arm        0.12                         +-----------------+-------------+----------+--------+  Dist upper arm       0.09                         +-----------------+-------------+----------+--------+  Antecubital fossa    0.11                         +-----------------+-------------+----------+--------+  Prox forearm         0.13                         +-----------------+-------------+----------+--------+  Mid forearm          0.12                         +-----------------+-------------+----------+--------+  Dist forearm         0.13                         +-----------------+-------------+----------+--------+  Wrist               0.14                          +-----------------+-------------+----------+--------+   +-----------------+-------------+----------+--------+  Right Basilic    Diameter (cm)Depth (cm)Findings  +-----------------+-------------+----------+--------+  Mid upper arm        0.14                         +-----------------+-------------+----------+--------+  Dist upper arm       0.26                         +-----------------+-------------+----------+--------+  Antecubital fossa    0.16                         +-----------------+-------------+----------+--------+   +-----------------+-------------+----------+--------+  Left Cephalic    Diameter (cm)Depth (cm)Findings  +-----------------+-------------+----------+--------+  Prox upper arm       0.16                         +-----------------+-------------+----------+--------+  Mid upper arm        0.13                         +-----------------+-------------+----------+--------+  Dist upper arm       0.11                         +-----------------+-------------+----------+--------+  Antecubital fossa    0.12                         +-----------------+-------------+----------+--------+  Prox forearm         0.15                         +-----------------+-------------+----------+--------+  Mid forearm          0.08                         +-----------------+-------------+----------+--------+  Dist forearm         0.07                         +-----------------+-------------+----------+--------+  Wrist               0.10                         +-----------------+-------------+----------+--------+   +-----------------+-------------+----------+--------+  Left Basilic     Diameter (cm)Depth (cm)Findings  +-----------------+-------------+----------+--------+  Prox upper arm       0.28                         +-----------------+-------------+----------+--------+  Mid upper arm        0.21                          +-----------------+-------------+----------+--------+  Dist upper arm       0.19                         +-----------------+-------------+----------+--------+  Antecubital fossa    0.18                         +-----------------+-------------+----   ASSESSMENT and PLAN   CKD: The patient has very small surface veins.  I think her only option is a graft.  I discussed proceeding with a left upper arm graft.  I will evaluate her surface veins to see if she has a fistula option.  Nephrology has recommended waiting on graft placement.  This will be done and their determination.  I discussed the risk of premature graft failure, the need for future interventions, the risk of infection, and the risk of steal syndrome.  All questions were answered.   Malvina Serene CLORE, MD, FACS [facility] [phone] Pager [phone]

## 2024-01-18 NOTE — Therapy (Incomplete)
 OUTPATIENT PHYSICAL THERAPY NEURO TREATMENT   Patient Name: Kelli Hudson MRN: [id number] DOB:1990-03-22, 33 y.o., female Today's Date: 01/18/2024   PCP: Daphne Lesches, NP REFERRING PROVIDER: Jerona Sage, MD  END OF SESSION:  PT End of Session - 01/18/24 1502     Visit Number 14    Number of Visits 25    Date for Recertification  02/19/24    Authorization Type [employer] dual    Progress Note Due on Visit 10    PT Start Time 1316    PT Stop Time 1405    PT Time Calculation (min) 49 min    Equipment Utilized During Treatment Gait belt    Activity Tolerance Patient tolerated treatment well    Behavior During Therapy WFL for tasks assessed/performed             Past Medical History:  Diagnosis Date   Acute H. pylori gastric ulcer    Chronic kidney disease    Coffee ground emesis    Diabetes mellitus (HCC)    Diabetic gastroparesis (HCC)    DKA (diabetic ketoacidosis) (HCC) 02/24/2021   Gastroparesis    GERD (gastroesophageal reflux disease)    Hypertension    Hyperthyroidism    Intractable nausea and vomiting 04/20/2021   Normocytic anemia 04/20/2020   Prolonged Q-T interval on ECG    Past Surgical History:  Procedure Laterality Date   AMPUTATION Bilateral 10/24/2022   Procedure: BILATERAL BELOW KNEE AMPUTATION;  Surgeon: Sage Jerona GAILS, MD;  Location: [facility] OR;  Service: Orthopedics;  Laterality: Bilateral;   AMPUTATION Bilateral 10/08/2022   Procedure: BILATERAL LEG DEBRIDEMENT;  Surgeon: Sage Jerona GAILS, MD;  Location: [facility] OR;  Service: Orthopedics;  Laterality: Bilateral;   AMPUTATION Bilateral 11/14/2022   Procedure: BILATERAL ABOVE KNEE AMPUTATION;  Surgeon: Sage Jerona GAILS, MD;  Location: [facility] OR;  Service: Orthopedics;  Laterality: Bilateral;   AMPUTATION TOE Left 03/10/2018   Procedure: AMPUTATION FIFTH TOE;  Surgeon: Gershon Donnice SAUNDERS, DPM;  Location: [facility];  Service: Podiatry;  Laterality: Left;   APPLICATION OF WOUND VAC Bilateral  10/12/2022   Procedure: APPLICATION OF WOUND VAC BILATERAL LEGS;  Surgeon: Celena Sharper, MD;  Location: [facility] OR;  Service: Orthopedics;  Laterality: Bilateral;   APPLICATION OF WOUND VAC Bilateral 10/24/2022   Procedure: APPLICATION OF WOUND VAC;  Surgeon: Sage Jerona GAILS, MD;  Location: [facility] OR;  Service: Orthopedics;  Laterality: Bilateral;   BIOPSY  01/28/2020   Procedure: BIOPSY;  Surgeon: Elicia Claw, MD;  Location: [facility];  Service: Gastroenterology;;   BOTOX  INJECTION  08/26/2021   Procedure: BOTOX  INJECTION;  Surgeon: Legrand Victory LITTIE DOUGLAS, MD;  Location: [facility];  Service: Gastroenterology;;   ESOPHAGOGASTRODUODENOSCOPY N/A 01/28/2020   Procedure: ESOPHAGOGASTRODUODENOSCOPY (EGD);  Surgeon: Elicia Claw, MD;  Location: [facility];  Service: Gastroenterology;  Laterality: N/A;   ESOPHAGOGASTRODUODENOSCOPY (EGD) WITH PROPOFOL  Left 09/08/2015   Procedure: ESOPHAGOGASTRODUODENOSCOPY (EGD) WITH PROPOFOL ;  Surgeon: Elsie Cree, MD;  Location: [facility];  Service: Endoscopy;  Laterality: Left;   ESOPHAGOGASTRODUODENOSCOPY (EGD) WITH PROPOFOL  N/A 08/26/2021   Procedure: ESOPHAGOGASTRODUODENOSCOPY (EGD) WITH PROPOFOL ;  Surgeon: Legrand Victory LITTIE DOUGLAS, MD;  Location: [facility];  Service: Gastroenterology;  Laterality: N/A;   GASTRIC STIMULATOR IMPLANT SURGERY  06/2022   at [facility]   I & D EXTREMITY Bilateral 10/12/2022   Procedure: IRRIGATION AND  DEBRIDEMENT  BILATERAL LEGS;  Surgeon: Celena Sharper, MD;  Location: [facility] OR;  Service: Orthopedics;  Laterality: Bilateral;   I & D EXTREMITY Right  10/13/2022   Procedure: RIGHT THIGH WOUND VAC EXCHANGE;  Surgeon: Kendal Franky SQUIBB, MD;  Location: [facility] OR;  Service: Orthopedics;  Laterality: Right;   I & D EXTREMITY Bilateral 10/17/2022   Procedure: BILATERAL THIGH AND LEG DEBRIDEMENT, PARTIAL BILATERAL FOOT AMPUTATION;  Surgeon: Harden Jerona GAILS, MD;  Location: [facility] OR;  Service: Orthopedics;  Laterality: Bilateral;   I & D EXTREMITY Bilateral  11/05/2022   Procedure: BILATERAL THIGH DEBRIDEMENT;  Surgeon: Harden Jerona GAILS, MD;  Location: [facility] OR;  Service: Orthopedics;  Laterality: Bilateral;   I & D EXTREMITY Bilateral 11/07/2022   Procedure: BILATERAL THIGH AND LEG DEBRIDEMENT;  Surgeon: Harden Jerona GAILS, MD;  Location: [facility] OR;  Service: Orthopedics;  Laterality: Bilateral;   I & D EXTREMITY Bilateral 11/12/2022   Procedure: BILATERAL LEG DEBRIDEMENTS;  Surgeon: Harden Jerona GAILS, MD;  Location: [facility] OR;  Service: Orthopedics;  Laterality: Bilateral;   IRRIGATION AND DEBRIDEMENT FOOT Bilateral 10/05/2022   Procedure: IRRIGATION AND DEBRIDEMENT FOOT;  Surgeon: Janit Thresa HERO, DPM;  Location: [facility];  Service: Orthopedics/Podiatry;  Laterality: Bilateral;   WISDOM TOOTH EXTRACTION     Patient Active Problem List   Diagnosis Date Noted   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   Hypokalemia 11/27/2022   Effusion, left knee 11/05/2022   MDD (major depressive disorder), recurrent episode, moderate (HCC) 10/16/2022   Necrotizing fasciitis of pelvic region and thigh (HCC) 10/08/2022   Necrotizing fasciitis of lower leg (HCC) 10/08/2022   Necrotizing fasciitis (HCC) 10/08/2022   Sepsis due to cellulitis (HCC) 10/03/2022   Cellulitis of lower extremity 10/03/2022   Multinodular goiter 06/18/2022   Malnutrition of moderate degree 04/18/2022   Intractable vomiting with nausea 04/17/2022   DKA (diabetic ketoacidosis) (HCC) 03/04/2022   Nausea vomiting and diarrhea 12/25/2021   Diabetic gastroparesis (HCC) 10/09/2021    Drug-seeking behavior 09/21/2021   Essential hypertension 08/25/2021   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) 08/25/2021   GERD without esophagitis 08/25/2021   Hematemesis    Prolonged Q-T interval on ECG    Nausea and vomiting 07/20/2021   Anemia of chronic disease 06/06/2021   Dehydration 06/04/2021   Hypomagnesemia 04/21/2021   Ulcerated, foot, left, with fat layer exposed (HCC) 04/20/2021   Uncontrolled type 1 diabetes mellitus with hyperglycemia, with long-term current use of insulin  (HCC) 12/18/2020   DM type 1 (diabetes mellitus, type 1) (HCC) 12/18/2020   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 11/09/2020   Diabetic retinopathy of both eyes associated with type 1 diabetes mellitus (HCC) 09/24/2020   Diabetic gastroparesis associated with type 1 diabetes mellitus (HCC) 08/12/2020   Acute kidney injury superimposed on chronic kidney disease 07/25/2020   Generalized abdominal pain 07/23/2020   GERD (gastroesophageal reflux disease) 07/23/2020   Diabetic nephropathy associated with type 1 diabetes mellitus (HCC) 06/27/2020   Sepsis (HCC) 05/27/2020   HLD (hyperlipidemia) 05/23/2020   Sinus tachycardia 05/09/2020   Anemia 04/20/2020   Depression with anxiety 07/05/2019   Diabetic polyneuropathy associated with type 1 diabetes mellitus (HCC) 09/24/2017   Gastroparesis  Goiter 10/05/2009   DM2 (diabetes mellitus, type 2) (HCC) 10/05/2009   Hypertension associated with diabetes (HCC) 10/05/2009    ONSET DATE: 11/02/23 referral   REFERRING DIAG: [phone] (ICD-10-CM) - S/P AKA (above knee amputation) bilateral (HCC)   THERAPY DIAG:  Muscle weakness (generalized)  Abnormal posture  Difficulty in walking, not elsewhere classified  Other abnormalities of gait and mobility  Rationale for Evaluation and Treatment: Rehabilitation  SUBJECTIVE:                                                                                                                                                                                              SUBJECTIVE STATEMENT: Patient arrives to clinic in [facility] accompanied by significant other, Drew. Patient was seen by doctor prior to this session for dialysis consultation but not sure when she would start, maybe later on. She said the dialysis will eventually happen unless she gets a kidney transplant. If she does dialysis she said she has to get a valve for her veins. Patient states her back hurts a lot today and she is going to doctor tomorrow to ensure there isn't an infection like a UTI. Patient denies falls.   Pt accompanied by: significant other  PERTINENT HISTORY: DM, diabetic gastroparesis, GERD, HTN, hyperthyroidism, normocytic anemia, B AKA  PAIN:  Are you having pain? No - reports pain in left groin region w/ pressure from socket  PRECAUTIONS: Fall   PATIENT GOALS: to learn how to walk with new prosthetics                                                                                                                             TREATMENT   Therapeutic Activity: STS x 4 reps from 21 seat height of WC x 2 but only mod assist from 1 person and bariatric front wheeled rolling walker X2 min assist for knee extension during standing Cued for hip extension during standing and anterior weight shifting for trunk flexion during sit to stand transfer, no assist for static standing, CGA Provided stretching HEP: - Single knee to  chest in supine - Double knee to chest in supine - Rotation in supine   Gait Training: Overground training x42ft, forward gait with 3 turns, with bariatric front wheeled rolling walker x 2 but only mod assist from 1 person during sit to stand and bariatric front wheeled rolling walker, follow behind with wheelchair X2 min assist for knee extension during standing CGA during gait but no assist needed during ambulation knee extension assist to standing and cueing hip  extension, toe off for knee flexion in terminal stance, cueing for feet repositioning during turns as BOS narrowed with turns, cueing for turning 2 seated rest breaks needed throughout due to back pain Internal rotational of L prosthetic and L toeing in during L toe off with possible influence from some rotation of L prosthetic. 3 ply was added and was trimmed to only fit base of residual limb, well tolerated, pt stated it felt better and less internal rotation of L limb noted  PATIENT EDUCATION: Education details: Continue HEP, addition of 3 ply sock on left, stretches to help reduce low back pain, seeing doctor tomorrow to discuss cause of back pain and ensure no UTI or infection present Person educated: Patient and SO Education method: Medical Illustrator Education comprehension: verbalized understanding and needs further education  HOME EXERCISE PROGRAM: -practice donning/doffing prosthetics as much as possible   GOALS: Goals reviewed with patient? Yes  SHORT TERM GOALS: Target date: 12/25/23  Pt will be independent with initial HEP for improved functional mobility and independence with new prosthetics  Baseline: needs to be updated - pt progressing to safety w/ standing program at home (11/24) Goal status: IN PROGRESS  2.  Patient will complete sit <> stand with LRAD and no more than MinA consistently  Baseline: up to MaxA in // bars; maxA to bariatric walker (11/24) Goal status: IN PROGRESS  3.  Patient will initiate ambulation with an ambulatory device with assist as needed Baseline: only able to ambulate in // bars; ambulates x12 ft w/ CGA and w/c follow (11/24) Goal status: MET  4.  Patient will be able to don/doff B prosthetics with no more than MinA  Baseline: MaxA; IND (11/24) Goal status: MET  LONG TERM GOALS: Target date: 02/19/24  Pt will be independent with final HEP for improved functional mobility and independence with new prosthetics  Baseline: to be  provided Goal status: INITIAL  2.  Patient will complete sit <> stand with LRAD with no more than supervision consistently  Baseline: MaxA in // bars  Goal status: INITIAL  3.  Patient will ambulate >/=71ft with ambulatory device and no more than MinA Baseline: unable-only ambulating in // bars Goal status: REVISED  4.  Patient will traverse at least 1 community obstacle (curb vs ramp) with LRAD and no more than CGA Baseline: unable on eval  Goal status: INITIAL  5.  Patient will don/doff B prosthetics independently  Baseline: MaxA Goal status: INITIAL   ASSESSMENT:  CLINICAL IMPRESSION: Patient seen for skilled physical therapy today with emphasis on sit to stand transfers and gait training. Session emphasis on gait training with front wheeled rolling walker with 85 feet completed today with rest needed throughout due to back pain. Patient decreased in assistance needed today during sit to stand but still needs x 2 assistance to extend knees. Continued cueing needed for toe off during terminal stance as there is an internal rotational force from toe off on L LE and possible influence from rotation of L prosthetic.  3 ply was added and was trimmed to only fit base of residual limb and pt stated it felt better and less internal rotation of L limb noted. Patient continues to benefit from skilled PT to improve gait mechanics and maximized use of MPK features. Continue POC.   OBJECTIVE IMPAIRMENTS: Abnormal gait, cardiopulmonary status limiting activity, decreased activity tolerance, decreased balance, decreased endurance, decreased knowledge of use of DME, decreased mobility, difficulty walking, decreased strength, impaired vision/preception, and prosthetic dependency .   ACTIVITY LIMITATIONS: carrying, lifting, bending, standing, squatting, stairs, locomotion level, and caring for others  PARTICIPATION LIMITATIONS: meal prep, cleaning, interpersonal relationship, driving, shopping,  community activity, and occupation  PERSONAL FACTORS: Age, Behavior pattern, Fitness, Past/current experiences, Social background, Time since onset of injury/illness/exacerbation, Transportation, and 3+ comorbidities: see above are also affecting patient's functional outcome.   REHAB POTENTIAL: Fair complexity of comorbidities   CLINICAL DECISION MAKING: Evolving/moderate complexity  EVALUATION COMPLEXITY: Moderate  PLAN:  PT FREQUENCY: 2x/week  PT DURATION: 12 weeks  PLANNED INTERVENTIONS: [id number]- PT Re-evaluation, 97750- Physical Performance Testing, 97110-Therapeutic exercises, 97530- Therapeutic activity, [id number]- Neuromuscular re-education, 97535- Self Care, [id number]- Manual therapy, [id number]- Gait training, [phone]- Orthotic Initial, [id number]- Prosthetic Initial , [phone]- Orthotic/Prosthetic subsequent, [phone]- Canalith repositioning, [id number]- Aquatic Therapy, [phone] (1-2 muscles), [id number] (3+ muscles)- Dry Needling, Patient/Family education, Balance training, Stair training, Vestibular training, Visual/preceptual remediation/compensation, DME instructions, Wheelchair mobility training, Cryotherapy, and Moist heat  PLAN FOR NEXT SESSION: sink HEP practice, Continue to problem shoot L rotation from prosthetic or toeing in - normal toeing in? Or influence of some rotation from prosthetic? Need more socks?  Practice straight pathways for endurance and add in turns for increased challenge.  Emmalene Sherry, Student-PT, DPT  01/18/2024, 5:42 PM

## 2024-01-21 ENCOUNTER — Ambulatory Visit

## 2024-01-21 VITALS — BP 131/83 | HR 94

## 2024-01-21 DIAGNOSIS — M6281 Muscle weakness (generalized): Secondary | ICD-10-CM | POA: Diagnosis not present

## 2024-01-21 DIAGNOSIS — R293 Abnormal posture: Secondary | ICD-10-CM

## 2024-01-21 DIAGNOSIS — R262 Difficulty in walking, not elsewhere classified: Secondary | ICD-10-CM

## 2024-01-21 NOTE — Therapy (Signed)
 OUTPATIENT PHYSICAL THERAPY NEURO TREATMENT   Patient Name: Kelli Hudson MRN: [id number] DOB:[birth year], 33 y.o., female Today's Date: 01/21/2024   PCP: Daphne Lesches, NP REFERRING PROVIDER: Jerona Sage, MD  END OF SESSION:  PT End of Session - 01/21/24 1150     Visit Number 15    Number of Visits 25    Date for Recertification  02/19/24    Authorization Type [employer] dual    Progress Note Due on Visit 10    PT Start Time 1149    PT Stop Time 1230    PT Time Calculation (min) 41 min    Equipment Utilized During Treatment Gait belt    Activity Tolerance Patient tolerated treatment well    Behavior During Therapy WFL for tasks assessed/performed              Past Medical History:  Diagnosis Date   Acute H. pylori gastric ulcer    Chronic kidney disease    Coffee ground emesis    Diabetes mellitus (HCC)    Diabetic gastroparesis (HCC)    DKA (diabetic ketoacidosis) (HCC) 02/24/2021   Gastroparesis    GERD (gastroesophageal reflux disease)    Hypertension    Hyperthyroidism    Intractable nausea and vomiting 04/20/2021   Normocytic anemia 04/20/2020   Prolonged Q-T interval on ECG    Past Surgical History:  Procedure Laterality Date   AMPUTATION Bilateral 10/24/2022   Procedure: BILATERAL BELOW KNEE AMPUTATION;  Surgeon: Sage Jerona GAILS, MD;  Location: [facility] OR;  Service: Orthopedics;  Laterality: Bilateral;   AMPUTATION Bilateral 10/08/2022   Procedure: BILATERAL LEG DEBRIDEMENT;  Surgeon: Sage Jerona GAILS, MD;  Location: [facility] OR;  Service: Orthopedics;  Laterality: Bilateral;   AMPUTATION Bilateral 11/14/2022   Procedure: BILATERAL ABOVE KNEE AMPUTATION;  Surgeon: Sage Jerona GAILS, MD;  Location: [facility] OR;  Service: Orthopedics;  Laterality: Bilateral;   AMPUTATION TOE Left 03/10/2018   Procedure: AMPUTATION FIFTH TOE;  Surgeon: Gershon Donnice SAUNDERS, DPM;  Location: [facility];  Service: Podiatry;  Laterality: Left;   APPLICATION OF WOUND VAC  Bilateral 10/12/2022   Procedure: APPLICATION OF WOUND VAC BILATERAL LEGS;  Surgeon: Celena Sharper, MD;  Location: [facility] OR;  Service: Orthopedics;  Laterality: Bilateral;   APPLICATION OF WOUND VAC Bilateral 10/24/2022   Procedure: APPLICATION OF WOUND VAC;  Surgeon: Sage Jerona GAILS, MD;  Location: [facility] OR;  Service: Orthopedics;  Laterality: Bilateral;   BIOPSY  01/28/2020   Procedure: BIOPSY;  Surgeon: Elicia Claw, MD;  Location: [facility];  Service: Gastroenterology;;   BOTOX  INJECTION  08/26/2021   Procedure: BOTOX  INJECTION;  Surgeon: Legrand Victory LITTIE DOUGLAS, MD;  Location: [facility];  Service: Gastroenterology;;   ESOPHAGOGASTRODUODENOSCOPY N/A 01/28/2020   Procedure: ESOPHAGOGASTRODUODENOSCOPY (EGD);  Surgeon: Elicia Claw, MD;  Location: [facility];  Service: Gastroenterology;  Laterality: N/A;   ESOPHAGOGASTRODUODENOSCOPY (EGD) WITH PROPOFOL  Left 09/08/2015   Procedure: ESOPHAGOGASTRODUODENOSCOPY (EGD) WITH PROPOFOL ;  Surgeon: Elsie Cree, MD;  Location: [facility];  Service: Endoscopy;  Laterality: Left;   ESOPHAGOGASTRODUODENOSCOPY (EGD) WITH PROPOFOL  N/A 08/26/2021   Procedure: ESOPHAGOGASTRODUODENOSCOPY (EGD) WITH PROPOFOL ;  Surgeon: Legrand Victory LITTIE DOUGLAS, MD;  Location: [facility];  Service: Gastroenterology;  Laterality: N/A;   GASTRIC STIMULATOR IMPLANT SURGERY  06/2022   at [facility]   I & D EXTREMITY Bilateral 10/12/2022   Procedure: IRRIGATION AND  DEBRIDEMENT  BILATERAL LEGS;  Surgeon: Celena Sharper, MD;  Location: [facility] OR;  Service: Orthopedics;  Laterality: Bilateral;   I & D EXTREMITY  Right 10/13/2022   Procedure: RIGHT THIGH WOUND VAC EXCHANGE;  Surgeon: Kendal Franky SQUIBB, MD;  Location: [facility] OR;  Service: Orthopedics;  Laterality: Right;   I & D EXTREMITY Bilateral 10/17/2022   Procedure: BILATERAL THIGH AND LEG DEBRIDEMENT, PARTIAL BILATERAL FOOT AMPUTATION;  Surgeon: Harden Jerona GAILS, MD;  Location: [facility] OR;  Service: Orthopedics;  Laterality: Bilateral;   I & D EXTREMITY  Bilateral 11/05/2022   Procedure: BILATERAL THIGH DEBRIDEMENT;  Surgeon: Harden Jerona GAILS, MD;  Location: [facility] OR;  Service: Orthopedics;  Laterality: Bilateral;   I & D EXTREMITY Bilateral 11/07/2022   Procedure: BILATERAL THIGH AND LEG DEBRIDEMENT;  Surgeon: Harden Jerona GAILS, MD;  Location: [facility] OR;  Service: Orthopedics;  Laterality: Bilateral;   I & D EXTREMITY Bilateral 11/12/2022   Procedure: BILATERAL LEG DEBRIDEMENTS;  Surgeon: Harden Jerona GAILS, MD;  Location: [facility] OR;  Service: Orthopedics;  Laterality: Bilateral;   IRRIGATION AND DEBRIDEMENT FOOT Bilateral 10/05/2022   Procedure: IRRIGATION AND DEBRIDEMENT FOOT;  Surgeon: Janit Thresa HERO, DPM;  Location: [facility];  Service: Orthopedics/Podiatry;  Laterality: Bilateral;   WISDOM TOOTH EXTRACTION     Patient Active Problem List   Diagnosis Date Noted   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   Hypokalemia 11/27/2022   Effusion, left knee 11/05/2022   MDD (major depressive disorder), recurrent episode, moderate (HCC) 10/16/2022   Necrotizing fasciitis of pelvic region and thigh (HCC) 10/08/2022   Necrotizing fasciitis of lower leg (HCC) 10/08/2022   Necrotizing fasciitis (HCC) 10/08/2022   Sepsis due to cellulitis (HCC) 10/03/2022   Cellulitis of lower extremity 10/03/2022   Multinodular goiter 06/18/2022   Malnutrition of moderate degree 04/18/2022   Intractable vomiting with nausea 04/17/2022   DKA (diabetic ketoacidosis) (HCC) 03/04/2022   Nausea vomiting and diarrhea 12/25/2021   Diabetic gastroparesis (HCC)  10/09/2021   Drug-seeking behavior 09/21/2021   Essential hypertension 08/25/2021   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) 08/25/2021   GERD without esophagitis 08/25/2021   Hematemesis    Prolonged Q-T interval on ECG    Nausea and vomiting 07/20/2021   Anemia of chronic disease 06/06/2021   Dehydration 06/04/2021   Hypomagnesemia 04/21/2021   Ulcerated, foot, left, with fat layer exposed (HCC) 04/20/2021   Uncontrolled type 1 diabetes mellitus with hyperglycemia, with long-term current use of insulin  (HCC) 12/18/2020   DM type 1 (diabetes mellitus, type 1) (HCC) 12/18/2020   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 11/09/2020   Diabetic retinopathy of both eyes associated with type 1 diabetes mellitus (HCC) 09/24/2020   Diabetic gastroparesis associated with type 1 diabetes mellitus (HCC) 08/12/2020   Acute kidney injury superimposed on chronic kidney disease 07/25/2020   Generalized abdominal pain 07/23/2020   GERD (gastroesophageal reflux disease) 07/23/2020   Diabetic nephropathy associated with type 1 diabetes mellitus (HCC) 06/27/2020   Sepsis (HCC) 05/27/2020   HLD (hyperlipidemia) 05/23/2020   Sinus tachycardia 05/09/2020   Anemia 04/20/2020   Depression with anxiety 07/05/2019   Diabetic polyneuropathy associated with type 1 diabetes mellitus (HCC) 09/24/2017   Gastroparesis  Goiter 10/05/2009   DM2 (diabetes mellitus, type 2) (HCC) 10/05/2009   Hypertension associated with diabetes (HCC) 10/05/2009    ONSET DATE: 11/02/23 referral   REFERRING DIAG: [phone] (ICD-10-CM) - S/P AKA (above knee amputation) bilateral (HCC)   THERAPY DIAG:  Muscle weakness (generalized)  Abnormal posture  Difficulty in walking, not elsewhere classified  Rationale for Evaluation and Treatment: Rehabilitation  SUBJECTIVE:                                                                                                                                                                                              SUBJECTIVE STATEMENT:   Patient arrives to clinic in [facility] with prosthetics donned and accompanied by significant other, Drew. She is wearing the 3ply sock today that was given to her at the last visit. Pt states blood sugar is 235mg /dL. She has that she has a [facility] appointment coming up and plans to have him look at the turning of the prosthetic during gait. She says her back doesn't hurt as much and that she did a lot of resting since the last visit. She didn't get to do the stretches at home for her back. Patient denies falls.  Pt accompanied by: significant other  PERTINENT HISTORY: DM, diabetic gastroparesis, GERD, HTN, hyperthyroidism, normocytic anemia, B AKA  PAIN:  Are you having pain? No - reports pain in left groin region w/ pressure from socket  PRECAUTIONS: Fall   PATIENT GOALS: to learn how to walk with new prosthetics                                                                                                                             TREATMENT   Therapeutic Activity: STS x 3 reps from 21 seat height of WC x 2 but only mod assist from 1 person and bariatric front wheeled rolling walker X2 min assist for knee extension during standing Cued for hip extension during standing and anterior weight shifting for trunk flexion during sit to stand transfer, no assist for static standing, CGA   Gait Training: Overground  training x57ft, forward gait with 2 turns, with bariatric front wheeled rolling walker Sit to stand was x 2 person but only mod assist from 1 person during sit to stand and bariatric front wheeled rolling walker, follow behind with wheelchair X2 min assist for knee extension during standing During ambulation - CGA, no assistance needed Knee extension assist to standing and cueing hip extension, toe off for knee flexion in terminal stance, cueing for feet repositioning during turns as BOS narrowed with  turns, cueing for turning 2 seated rest breaks due to fatigue, increased fatigue to due compensation needed with IR of L prosthetic Internal rotational of L prosthetic and L toeing in during L toe off with possible influence from some rotation of L prosthetic, this creates difficulty to weight shift and toe off during terminal stance and thus decreases L knee flexion For L prosthetic: A second, full length 3 ply sock was added under the trimmed 3 ply sock given at last visit. Pt stated it felt snuck and that her residual limb seemed to fit better in the socket. During gait training, this did not seem to prevent the internal rotation of the L toe and L socket. She said she goes to [facility] soon and will discuss this issue with them and possible need for socket adjustments as dicussed today in clinic.  PATIENT EDUCATION: Education details: HEP for back pain, discussing internal rotation of prosthetic during next [facility] visit Person educated: Patient and SO Education method: Medical Illustrator Education comprehension: verbalized understanding and needs further education  HOME EXERCISE PROGRAM: -practice donning/doffing prosthetics as much as possible   GOALS: Goals reviewed with patient? Yes  SHORT TERM GOALS: Target date: 12/25/23  Pt will be independent with initial HEP for improved functional mobility and independence with new prosthetics  Baseline: needs to be updated - pt progressing to safety w/ standing program at home (11/24) Goal status: IN PROGRESS  2.  Patient will complete sit <> stand with LRAD and no more than MinA consistently  Baseline: up to MaxA in // bars; maxA to bariatric walker (11/24) Goal status: IN PROGRESS  3.  Patient will initiate ambulation with an ambulatory device with assist as needed Baseline: only able to ambulate in // bars; ambulates x12 ft w/ CGA and w/c follow (11/24) Goal status: MET  4.  Patient will be able to don/doff B prosthetics with no  more than MinA  Baseline: MaxA; IND (11/24) Goal status: MET  LONG TERM GOALS: Target date: 02/19/24  Pt will be independent with final HEP for improved functional mobility and independence with new prosthetics  Baseline: to be provided Goal status: INITIAL  2.  Patient will complete sit <> stand with LRAD with no more than supervision consistently  Baseline: MaxA in // bars  Goal status: INITIAL  3.  Patient will ambulate >/=66ft with ambulatory device and no more than MinA Baseline: unable-only ambulating in // bars Goal status: REVISED  4.  Patient will traverse at least 1 community obstacle (curb vs ramp) with LRAD and no more than CGA Baseline: unable on eval  Goal status: INITIAL  5.  Patient will don/doff B prosthetics independently  Baseline: MaxA Goal status: INITIAL   ASSESSMENT:  CLINICAL IMPRESSION: Patient seen for skilled physical therapy today with emphasis on gait training and trouble shooting internal rotation and toeing in of L prosthetic. Session emphasis on gait training with front wheeled rolling walker of 95 feet completed today with rest needed throughout with increased  fatigue to due compensation needed with IR of L prosthetic. A second, full length 3 ply sock was added under the trimmed 3 ply sock given at last visit. Pt stated it felt snug and that her residual limb seemed to fit better in the socket. During gait training, this did not seem to prevent the internal rotation of the L toe and L socket. She said she goes to [facility] soon and will discuss this issue with them and possible need for socket adjustments as dicussed today in clinic and because this is her main limit to increase gait distance and endurance. Patient continues to benefit from skilled PT to improve gait mechanics and maximized use of MPK features. Continue POC.   OBJECTIVE IMPAIRMENTS: Abnormal gait, cardiopulmonary status limiting activity, decreased activity tolerance, decreased balance,  decreased endurance, decreased knowledge of use of DME, decreased mobility, difficulty walking, decreased strength, impaired vision/preception, and prosthetic dependency .   ACTIVITY LIMITATIONS: carrying, lifting, bending, standing, squatting, stairs, locomotion level, and caring for others  PARTICIPATION LIMITATIONS: meal prep, cleaning, interpersonal relationship, driving, shopping, community activity, and occupation  PERSONAL FACTORS: Age, Behavior pattern, Fitness, Past/current experiences, Social background, Time since onset of injury/illness/exacerbation, Transportation, and 3+ comorbidities: see above are also affecting patient's functional outcome.   REHAB POTENTIAL: Fair complexity of comorbidities   CLINICAL DECISION MAKING: Evolving/moderate complexity  EVALUATION COMPLEXITY: Moderate  PLAN:  PT FREQUENCY: 2x/week  PT DURATION: 12 weeks  PLANNED INTERVENTIONS: [id number]- PT Re-evaluation, 97750- Physical Performance Testing, 97110-Therapeutic exercises, 97530- Therapeutic activity, [id number]- Neuromuscular re-education, 97535- Self Care, [id number]- Manual therapy, [id number]- Gait training, [phone]- Orthotic Initial, [id number]- Prosthetic Initial , [phone]- Orthotic/Prosthetic subsequent, [phone]- Canalith repositioning, [id number]- Aquatic Therapy, [phone] (1-2 muscles), [id number] (3+ muscles)- Dry Needling, Patient/Family education, Balance training, Stair training, Vestibular training, Visual/preceptual remediation/compensation, DME instructions, Wheelchair mobility training, Cryotherapy, and Moist heat  PLAN FOR NEXT SESSION: sink HEP practice, Continue to problem shoot L rotation from prosthetic or toeing in - normal toeing in? Or influence of some rotation from prosthetic? Need more socks?  Practice straight pathways for endurance and add in turns for increased challenge.  3 ply full length sock was added - further trouble shooting needed for decreasing IR of L prosthetic. Pt will also talk to [facility] about this  at her upcoming visit.   Emmalene Sherry, Student-PT  01/21/2024, 2:57 PM

## 2024-01-22 ENCOUNTER — Inpatient Hospital Stay (HOSPITAL_COMMUNITY): Admission: RE | Admit: 2024-01-22 | Source: Ambulatory Visit

## 2024-01-25 ENCOUNTER — Ambulatory Visit

## 2024-01-28 ENCOUNTER — Ambulatory Visit

## 2024-02-01 ENCOUNTER — Ambulatory Visit: Admitting: Physical Therapy

## 2024-02-01 DIAGNOSIS — R2689 Other abnormalities of gait and mobility: Secondary | ICD-10-CM

## 2024-02-01 DIAGNOSIS — M6281 Muscle weakness (generalized): Secondary | ICD-10-CM | POA: Diagnosis not present

## 2024-02-01 DIAGNOSIS — R262 Difficulty in walking, not elsewhere classified: Secondary | ICD-10-CM

## 2024-02-01 NOTE — Therapy (Unsigned)
 OUTPATIENT PHYSICAL THERAPY NEURO TREATMENT   Patient Name: Kelli Hudson MRN: [id number] DOB:06/17/1990, 33 y.o., female Today's Date: 02/01/2024   PCP: Daphne Lesches, NP REFERRING PROVIDER: Jerona Sage, MD  END OF SESSION:  PT End of Session - 02/01/24 1415     Visit Number 16    Number of Visits 25    Date for Recertification  02/19/24    Authorization Type [employer] dual    Progress Note Due on Visit 10    PT Start Time 1325    PT Stop Time 1410    PT Time Calculation (min) 45 min    Equipment Utilized During Treatment Gait belt    Activity Tolerance Patient tolerated treatment well    Behavior During Therapy WFL for tasks assessed/performed              Past Medical History:  Diagnosis Date   Acute H. pylori gastric ulcer    Chronic kidney disease    Coffee ground emesis    Diabetes mellitus (HCC)    Diabetic gastroparesis (HCC)    DKA (diabetic ketoacidosis) (HCC) 02/24/2021   Gastroparesis    GERD (gastroesophageal reflux disease)    Hypertension    Hyperthyroidism    Intractable nausea and vomiting 04/20/2021   Normocytic anemia 04/20/2020   Prolonged Q-T interval on ECG    Past Surgical History:  Procedure Laterality Date   AMPUTATION Bilateral 10/24/2022   Procedure: BILATERAL BELOW KNEE AMPUTATION;  Surgeon: Sage Jerona GAILS, MD;  Location: [facility] OR;  Service: Orthopedics;  Laterality: Bilateral;   AMPUTATION Bilateral 10/08/2022   Procedure: BILATERAL LEG DEBRIDEMENT;  Surgeon: Sage Jerona GAILS, MD;  [facility] OR;  Service: Orthopedics;  Laterality: Bilateral;   AMPUTATION Bilateral 11/14/2022   Procedure: BILATERAL ABOVE KNEE AMPUTATION;  Surgeon: Sage Jerona GAILS, MD;  Location: [facility] OR;  Service: Orthopedics;  Laterality: Bilateral;   AMPUTATION TOE Left 03/10/2018   Procedure: AMPUTATION FIFTH TOE;  Surgeon: Gershon Donnice SAUNDERS, DPM;  Location: [facility];  Service: Podiatry;  Laterality: Left;   APPLICATION OF WOUND VAC  Bilateral 10/12/2022   Procedure: APPLICATION OF WOUND VAC BILATERAL LEGS;  Surgeon: Celena Sharper, MD;  Location: [facility] OR;  Service: Orthopedics;  Laterality: Bilateral;   APPLICATION OF WOUND VAC Bilateral 10/24/2022   Procedure: APPLICATION OF WOUND VAC;  Surgeon: Sage Jerona GAILS, MD;  Location: [facility] OR;  Service: Orthopedics;  Laterality: Bilateral;   BIOPSY  01/28/2020   Procedure: BIOPSY;  Surgeon: Elicia Claw, MD;  Location: [facility];  Service: Gastroenterology;;   BOTOX  INJECTION  08/26/2021   Procedure: BOTOX  INJECTION;  Surgeon: Legrand Victory LITTIE DOUGLAS, MD;  Location: [facility];  Service: Gastroenterology;;   ESOPHAGOGASTRODUODENOSCOPY N/A 01/28/2020   Procedure: ESOPHAGOGASTRODUODENOSCOPY (EGD);  Surgeon: Elicia Claw, MD;  Location: [facility];  Service: Gastroenterology;  Laterality: N/A;   ESOPHAGOGASTRODUODENOSCOPY (EGD) WITH PROPOFOL  Left 09/08/2015   Procedure: ESOPHAGOGASTRODUODENOSCOPY (EGD) WITH PROPOFOL ;  Surgeon: Elsie Cree, MD;  Location: [facility]COPY;  Service: Endoscopy;  Laterality: Left;   ESOPHAGOGASTRODUODENOSCOPY (EGD) WITH PROPOFOL  N/A 08/26/2021   Procedure: ESOPHAGOGASTRODUODENOSCOPY (EGD) WITH PROPOFOL ;  Surgeon: Legrand Victory LITTIE DOUGLAS, MD;  Location: [facility];  Service: Gastroenterology;  Laterality: N/A;   GASTRIC STIMULATOR IMPLANT SURGERY  06/2022   at [facility]   I & D EXTREMITY Bilateral 10/12/2022   Procedure: IRRIGATION AND  DEBRIDEMENT  BILATERAL LEGS;  Surgeon: Celena Sharper, MD;  Location: [facility] OR;  Service: Orthopedics;  Laterality: Bilateral;   I & D EXTREMITY  Right 10/13/2022   Procedure: RIGHT THIGH WOUND VAC EXCHANGE;  Surgeon: Kendal Franky SQUIBB, MD;  Location: [facility] OR;  Service: Orthopedics;  Laterality: Right;   I & D EXTREMITY Bilateral 10/17/2022   Procedure: BILATERAL THIGH AND LEG DEBRIDEMENT, PARTIAL BILATERAL FOOT AMPUTATION;  Surgeon: Harden Jerona GAILS, MD;  Location: [facility] OR;  Service: Orthopedics;  Laterality: Bilateral;   I & D EXTREMITY  Bilateral 11/05/2022   Procedure: BILATERAL THIGH DEBRIDEMENT;  Surgeon: Harden Jerona GAILS, MD;  Location: [facility] OR;  Service: Orthopedics;  Laterality: Bilateral;   I & D EXTREMITY Bilateral 11/07/2022   Procedure: BILATERAL THIGH AND LEG DEBRIDEMENT;  Surgeon: Harden Jerona GAILS, MD;  Location: [facility] OR;  Service: Orthopedics;  Laterality: Bilateral;   I & D EXTREMITY Bilateral 11/12/2022   Procedure: BILATERAL LEG DEBRIDEMENTS;  Surgeon: Harden Jerona GAILS, MD;  Location: [facility] OR;  Service: Orthopedics;  Laterality: Bilateral;   IRRIGATION AND DEBRIDEMENT FOOT Bilateral 10/05/2022   Procedure: IRRIGATION AND DEBRIDEMENT FOOT;  Surgeon: Janit Thresa HERO, DPM;  Location: [facility];  Service: Orthopedics/Podiatry;  Laterality: Bilateral;   WISDOM TOOTH EXTRACTION     Patient Active Problem List   Diagnosis Date Noted   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   Hypokalemia 11/27/2022   Effusion, left knee 11/05/2022   MDD (major depressive disorder), recurrent episode, moderate (HCC) 10/16/2022   Necrotizing fasciitis of pelvic region and thigh (HCC) 10/08/2022   Necrotizing fasciitis of lower leg (HCC) 10/08/2022   Necrotizing fasciitis (HCC) 10/08/2022   Sepsis due to cellulitis (HCC) 10/03/2022   Cellulitis of lower extremity 10/03/2022   Multinodular goiter 06/18/2022   Malnutrition of moderate degree 04/18/2022   Intractable vomiting with nausea 04/17/2022   DKA (diabetic ketoacidosis) (HCC) 03/04/2022   Nausea vomiting and diarrhea 12/25/2021   Diabetic gastroparesis (HCC)  10/09/2021   Drug-seeking behavior 09/21/2021   Essential hypertension 08/25/2021   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) 08/25/2021   GERD without esophagitis 08/25/2021   Hematemesis    Prolonged Q-T interval on ECG    Nausea and vomiting 07/20/2021   Anemia of chronic disease 06/06/2021   Dehydration 06/04/2021   Hypomagnesemia 04/21/2021   Ulcerated, foot, left, with fat layer exposed (HCC) 04/20/2021   Uncontrolled type 1 diabetes mellitus with hyperglycemia, with long-term current use of insulin  (HCC) 12/18/2020   DM type 1 (diabetes mellitus, type 1) (HCC) 12/18/2020   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 11/09/2020   Diabetic retinopathy of both eyes associated with type 1 diabetes mellitus (HCC) 09/24/2020   Diabetic gastroparesis associated with type 1 diabetes mellitus (HCC) 08/12/2020   Acute kidney injury superimposed on chronic kidney disease 07/25/2020   Generalized abdominal pain 07/23/2020   GERD (gastroesophageal reflux disease) 07/23/2020   Diabetic nephropathy associated with type 1 diabetes mellitus (HCC) 06/27/2020   Sepsis (HCC) 05/27/2020   HLD (hyperlipidemia) 05/23/2020   Sinus tachycardia 05/09/2020   Anemia 04/20/2020   Depression with anxiety 07/05/2019   Diabetic polyneuropathy associated with type 1 diabetes mellitus (HCC) 09/24/2017   Gastroparesis  Goiter 10/05/2009   DM2 (diabetes mellitus, type 2) (HCC) 10/05/2009   Hypertension associated with diabetes (HCC) 10/05/2009    ONSET DATE: 11/02/23 referral   REFERRING DIAG: [phone] (ICD-10-CM) - S/P AKA (above knee amputation) bilateral (HCC)   THERAPY DIAG:  Muscle weakness (generalized)  Difficulty in walking, not elsewhere classified  Other abnormalities of gait and mobility  Rationale for Evaluation and Treatment: Rehabilitation  SUBJECTIVE:                                                                                                                                                                                              SUBJECTIVE STATEMENT:   Patient arrives to clinic in [facility] with prosthetics donned and accompanied by significant other, Drew. She went to [facility] and they did adjustments to L prosthetic and told her that she won't need any socks in L socket. Pt is wearing 5 ply in R socket. Pt admits to wearing full length prosthesis for may be up to 2 hours a day. She reports of not wearing stubbies as much and is using wheelchair full time.   Pt accompanied by: significant other  PERTINENT HISTORY: DM, diabetic gastroparesis, GERD, HTN, hyperthyroidism, normocytic anemia, B AKA  PAIN:  Are you having pain? No - reports pain in left groin region w/ pressure from socket  PRECAUTIONS: Fall   PATIENT GOALS: to learn how to walk with new prosthetics                                                                                                                             TREATMENT   Performed sit to stand activity at Countertop:  Trial 1: max A x 1, 5' of standing, lateral weight shifts with ipsilateral glut set Trial 2: max A x 1 5' of standing, lateral weigth shifts with glut sets, lateral weight shifts with contralateral UE lift off the counter Trial 3: Max A x 1, 5' of standing, lateral weight shifts with glut sets, 2 x 5 of sliding contralateral hand OH on counters in front of her.  We discussed following during the session: - Pt should increase her tolerance to wearing stubbies/full length prosthetics for up to 4 hours a day. - Importance of wearing stubbies more during the day so she can functionally use them for standing, performing short distance walking. - Importance of performing 5 min of standing per 1 hour of wearing stubbies at home to practice on core control/lumbar strength, hip flexibility - Pt educated that practicing sit to stand with full length pads won't be safe due to max A required. We discussed  getting walker from [employer], [employer], [employer] or [address].    PATIENT EDUCATION: Education details: HEP for back pain, discussing internal rotation of prosthetic during next [facility] visit Person educated: Patient and SO Education method: Medical Illustrator Education comprehension: verbalized understanding and needs further education  HOME EXERCISE PROGRAM: -practice donning/doffing prosthetics as much as possible   GOALS: Goals reviewed with patient? Yes  SHORT TERM GOALS: Target date: 12/25/23  Pt will be independent with initial HEP for improved functional mobility and independence with new prosthetics  Baseline: needs to be updated - pt progressing to safety w/ standing program at home (11/24) Goal status: IN PROGRESS  2.  Patient will complete sit <> stand with LRAD and no more than MinA consistently  Baseline: up to MaxA in // bars; maxA to bariatric walker (11/24) Goal status: IN PROGRESS  3.  Patient will initiate ambulation with an ambulatory device with assist as needed Baseline: only able to ambulate in // bars; ambulates x12 ft w/ CGA and w/c follow (11/24) Goal status: MET  4.  Patient will be able to don/doff B prosthetics with no more than MinA  Baseline: MaxA; IND (11/24) Goal status: MET  LONG TERM GOALS: Target date: 02/19/24  Pt will be independent with final HEP for improved functional mobility and independence with new prosthetics  Baseline: to be provided Goal status: INITIAL  2.  Patient will complete sit <> stand with LRAD with no more than supervision consistently  Baseline: MaxA in // bars  Goal status: INITIAL  3.  Patient will ambulate >/=27ft with ambulatory device and no more than MinA Baseline: unable-only ambulating in // bars Goal status: REVISED  4.  Patient will traverse at least 1 community obstacle (curb vs ramp) with LRAD and no more than CGA Baseline: unable on eval  Goal status: INITIAL  5.  Patient will don/doff B  prosthetics independently  Baseline: MaxA Goal status: INITIAL   ASSESSMENT:  CLINICAL IMPRESSION: Today's session focused on performing sit to stand more independently at countertop but patient requires max A x 1 due to poor control. Pt advised to wear prosthesis for longer period of time. Pt educated to use stubbies more time.   OBJECTIVE IMPAIRMENTS: Abnormal gait, cardiopulmonary status limiting activity, decreased activity tolerance, decreased balance, decreased endurance, decreased knowledge of use of DME, decreased mobility, difficulty walking, decreased strength, impaired vision/preception, and prosthetic dependency .   ACTIVITY LIMITATIONS: carrying, lifting, bending, standing, squatting, stairs, locomotion level, and caring for others  PARTICIPATION LIMITATIONS: meal prep, cleaning, interpersonal relationship, driving, shopping, community activity, and occupation  PERSONAL FACTORS: Age, Behavior pattern, Fitness, Past/current experiences, Social background, Time since onset of injury/illness/exacerbation, Transportation, and 3+ comorbidities: see above are also affecting patient's functional outcome.   REHAB POTENTIAL: Fair complexity of comorbidities   CLINICAL DECISION MAKING: Evolving/moderate complexity  EVALUATION COMPLEXITY: Moderate  PLAN:  PT FREQUENCY: 2x/week  PT DURATION: 12 weeks  PLANNED INTERVENTIONS: [id number]- PT Re-evaluation, 97750-  Physical Performance Testing, 97110-Therapeutic exercises, 97530- Therapeutic activity, [id number]- Neuromuscular re-education, [phone]- Self Care, [id number]- Manual therapy, [id number]- Gait training, [phone]- Orthotic Initial, [id number]- Prosthetic Initial , [phone]- Orthotic/Prosthetic subsequent, [phone]- Canalith repositioning, [phone]- Aquatic Therapy, [phone] (1-2 muscles), [id number] (3+ muscles)- Dry Needling, Patient/Family education, Balance training, Stair training, Vestibular training, Visual/preceptual remediation/compensation, DME instructions,  Wheelchair mobility training, Cryotherapy, and Moist heat  PLAN FOR NEXT SESSION: sink HEP practice, Continue to problem shoot L rotation from prosthetic or toeing in - normal toeing in? Or influence of some rotation from prosthetic? Need more socks?  Practice straight pathways for endurance and add in turns for increased challenge.  3 ply full length sock was added - further trouble shooting needed for decreasing IR of L prosthetic. Pt will also talk to [facility] about this at her upcoming visit.   Raj LOISE Blanch, PT  02/01/2024, 2:15 PM

## 2024-02-04 ENCOUNTER — Ambulatory Visit: Admitting: Physical Therapy

## 2024-02-04 ENCOUNTER — Encounter: Payer: Self-pay | Admitting: Physical Therapy

## 2024-02-04 DIAGNOSIS — R2689 Other abnormalities of gait and mobility: Secondary | ICD-10-CM

## 2024-02-04 DIAGNOSIS — R262 Difficulty in walking, not elsewhere classified: Secondary | ICD-10-CM

## 2024-02-04 DIAGNOSIS — M6281 Muscle weakness (generalized): Secondary | ICD-10-CM

## 2024-02-04 DIAGNOSIS — R293 Abnormal posture: Secondary | ICD-10-CM

## 2024-02-04 DIAGNOSIS — R29898 Other symptoms and signs involving the musculoskeletal system: Secondary | ICD-10-CM

## 2024-02-04 NOTE — Therapy (Unsigned)
 OUTPATIENT PHYSICAL THERAPY NEURO TREATMENT   Patient Name: Kelli Hudson MRN: [id number] DOB:[birth year], 33 y.o., female Today's Date: 02/04/2024   PCP: Daphne Lesches, NP REFERRING PROVIDER: Jerona Sage, MD  END OF SESSION:  PT End of Session - 02/04/24 1455     Visit Number 17    Number of Visits 25    Date for Recertification  02/19/24    Authorization Type [employer] dual    Progress Note Due on Visit 10    PT Start Time 1453   pt arrived late   PT Stop Time 1534    PT Time Calculation (min) 41 min    Equipment Utilized During Treatment Gait belt    Activity Tolerance Patient tolerated treatment well    Behavior During Therapy WFL for tasks assessed/performed              Past Medical History:  Diagnosis Date   Acute H. pylori gastric ulcer    Chronic kidney disease    Coffee ground emesis    Diabetes mellitus (HCC)    Diabetic gastroparesis (HCC)    DKA (diabetic ketoacidosis) (HCC) 02/24/2021   Gastroparesis    GERD (gastroesophageal reflux disease)    Hypertension    Hyperthyroidism    Intractable nausea and vomiting 04/20/2021   Normocytic anemia 04/20/2020   Prolonged Q-T interval on ECG    Past Surgical History:  Procedure Laterality Date   AMPUTATION Bilateral 10/24/2022   Procedure: BILATERAL BELOW KNEE AMPUTATION;  Surgeon: Sage Jerona GAILS, MD;  Location: [facility] OR;  Service: Orthopedics;  Laterality: Bilateral;   AMPUTATION Bilateral 10/08/2022   Procedure: BILATERAL LEG DEBRIDEMENT;  Surgeon: Sage Jerona GAILS, MD;  Location: [facility] OR;  Service: Orthopedics;  Laterality: Bilateral;   AMPUTATION Bilateral 11/14/2022   Procedure: BILATERAL ABOVE KNEE AMPUTATION;  Surgeon: Sage Jerona GAILS, MD;  Location: [facility] OR;  Service: Orthopedics;  Laterality: Bilateral;   AMPUTATION TOE Left 03/10/2018   Procedure: AMPUTATION FIFTH TOE;  Surgeon: Gershon Donnice SAUNDERS, DPM;  Location: [facility];  Service: Podiatry;  Laterality: Left;   APPLICATION OF  WOUND VAC Bilateral 10/12/2022   Procedure: APPLICATION OF WOUND VAC BILATERAL LEGS;  Surgeon: Celena Sharper, MD;  Location: [facility] OR;  Service: Orthopedics;  Laterality: Bilateral;   APPLICATION OF WOUND VAC Bilateral 10/24/2022   Procedure: APPLICATION OF WOUND VAC;  Surgeon: Sage Jerona GAILS, MD;  Location: [facility] OR;  Service: Orthopedics;  Laterality: Bilateral;   BIOPSY  01/28/2020   Procedure: BIOPSY;  Surgeon: Elicia Claw, MD;  Location: [facility];  Service: Gastroenterology;;   BOTOX  INJECTION  08/26/2021   Procedure: BOTOX  INJECTION;  Surgeon: Legrand Victory LITTIE DOUGLAS, MD;  Location: [facility];  Service: Gastroenterology;;   ESOPHAGOGASTRODUODENOSCOPY N/A 01/28/2020   Procedure: ESOPHAGOGASTRODUODENOSCOPY (EGD);  Surgeon: Elicia Claw, MD;  Location: [facility];  Service: Gastroenterology;  Laterality: N/A;   ESOPHAGOGASTRODUODENOSCOPY (EGD) WITH PROPOFOL  Left 09/08/2015   Procedure: ESOPHAGOGASTRODUODENOSCOPY (EGD) WITH PROPOFOL ;  Surgeon: Elsie Cree, MD;  Location: [facility]COPY;  Service: Endoscopy;  Laterality: Left;   ESOPHAGOGASTRODUODENOSCOPY (EGD) WITH PROPOFOL  N/A 08/26/2021   Procedure: ESOPHAGOGASTRODUODENOSCOPY (EGD) WITH PROPOFOL ;  Surgeon: Legrand Victory LITTIE DOUGLAS, MD;  Location: [facility];  Service: Gastroenterology;  Laterality: N/A;   GASTRIC STIMULATOR IMPLANT SURGERY  06/2022   at [facility]   I & D EXTREMITY Bilateral 10/12/2022   Procedure: IRRIGATION AND  DEBRIDEMENT  BILATERAL LEGS;  Surgeon: Celena Sharper, MD;  Location: [facility] OR;  Service: Orthopedics;  Laterality: Bilateral;  I & D EXTREMITY Right 10/13/2022   Procedure: RIGHT THIGH WOUND VAC EXCHANGE;  Surgeon: Kendal Franky SQUIBB, MD;  Location: [facility] OR;  Service: Orthopedics;  Laterality: Right;   I & D EXTREMITY Bilateral 10/17/2022   Procedure: BILATERAL THIGH AND LEG DEBRIDEMENT, PARTIAL BILATERAL FOOT AMPUTATION;  Surgeon: Harden Jerona GAILS, MD;  Location: [facility] OR;  Service: Orthopedics;  Laterality: Bilateral;   I & D  EXTREMITY Bilateral 11/05/2022   Procedure: BILATERAL THIGH DEBRIDEMENT;  Surgeon: Harden Jerona GAILS, MD;  Location: [facility] OR;  Service: Orthopedics;  Laterality: Bilateral;   I & D EXTREMITY Bilateral 11/07/2022   Procedure: BILATERAL THIGH AND LEG DEBRIDEMENT;  Surgeon: Harden Jerona GAILS, MD;  Location: [facility] OR;  Service: Orthopedics;  Laterality: Bilateral;   I & D EXTREMITY Bilateral 11/12/2022   Procedure: BILATERAL LEG DEBRIDEMENTS;  Surgeon: Harden Jerona GAILS, MD;  Location: [facility] OR;  Service: Orthopedics;  Laterality: Bilateral;   IRRIGATION AND DEBRIDEMENT FOOT Bilateral 10/05/2022   Procedure: IRRIGATION AND DEBRIDEMENT FOOT;  Surgeon: Janit Thresa HERO, DPM;  Location: [facility];  Service: Orthopedics/Podiatry;  Laterality: Bilateral;   WISDOM TOOTH EXTRACTION     Patient Active Problem List   Diagnosis Date Noted   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   Hypokalemia 11/27/2022   Effusion, left knee 11/05/2022   MDD (major depressive disorder), recurrent episode, moderate (HCC) 10/16/2022   Necrotizing fasciitis of pelvic region and thigh (HCC) 10/08/2022   Necrotizing fasciitis of lower leg (HCC) 10/08/2022   Necrotizing fasciitis (HCC) 10/08/2022   Sepsis due to cellulitis (HCC) 10/03/2022   Cellulitis of lower extremity 10/03/2022   Multinodular goiter 06/18/2022   Malnutrition of moderate degree 04/18/2022   Intractable vomiting with nausea 04/17/2022   DKA (diabetic ketoacidosis) (HCC) 03/04/2022   Nausea vomiting and diarrhea 12/25/2021   Diabetic gastroparesis  (HCC) 10/09/2021   Drug-seeking behavior 09/21/2021   Essential hypertension 08/25/2021   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) 08/25/2021   GERD without esophagitis 08/25/2021   Hematemesis    Prolonged Q-T interval on ECG    Nausea and vomiting 07/20/2021   Anemia of chronic disease 06/06/2021   Dehydration 06/04/2021   Hypomagnesemia 04/21/2021   Ulcerated, foot, left, with fat layer exposed (HCC) 04/20/2021   Uncontrolled type 1 diabetes mellitus with hyperglycemia, with long-term current use of insulin  (HCC) 12/18/2020   DM type 1 (diabetes mellitus, type 1) (HCC) 12/18/2020   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 11/09/2020   Diabetic retinopathy of both eyes associated with type 1 diabetes mellitus (HCC) 09/24/2020   Diabetic gastroparesis associated with type 1 diabetes mellitus (HCC) 08/12/2020   Acute kidney injury superimposed on chronic kidney disease 07/25/2020   Generalized abdominal pain 07/23/2020   GERD (gastroesophageal reflux disease) 07/23/2020   Diabetic nephropathy associated with type 1 diabetes mellitus (HCC) 06/27/2020   Sepsis (HCC) 05/27/2020   HLD (hyperlipidemia) 05/23/2020   Sinus tachycardia 05/09/2020   Anemia 04/20/2020   Depression with anxiety 07/05/2019   Diabetic polyneuropathy associated with type 1 diabetes mellitus (HCC)  09/24/2017   Gastroparesis    Goiter 10/05/2009   DM2 (diabetes mellitus, type 2) (HCC) 10/05/2009   Hypertension associated with diabetes (HCC) 10/05/2009    ONSET DATE: 11/02/23 referral   REFERRING DIAG: [phone] (ICD-10-CM) - S/P AKA (above knee amputation) bilateral (HCC)   THERAPY DIAG:  Muscle weakness (generalized)  Difficulty in walking, not elsewhere classified  Other abnormalities of gait and mobility  Abnormal posture  Other symptoms and signs involving the musculoskeletal system  Rationale for Evaluation and Treatment: Rehabilitation  SUBJECTIVE:                                                                                                                                                                                              SUBJECTIVE STATEMENT:   Patient arrives to clinic in [facility] without prosthetics donned and accompanied by significant other, Drew. She is wearing 5 ply in R socket and 3-ply half sock in left socket. Pt admits to wearing full length prosthesis for may be up to 3 hours a day inconsistently.  She reports it is too difficult to use the bathroom with them on so this contributes to her inconsistent wear schedule.   Pt accompanied by: significant other  PERTINENT HISTORY: DM, diabetic gastroparesis, GERD, HTN, hyperthyroidism, normocytic anemia, B AKA  PAIN:  Are you having pain? No  PRECAUTIONS: Fall   PATIENT GOALS: to learn how to walk with new prosthetics                                                                                                                             TREATMENT   Performed sit to stand activity w/c to bariatric walker: -Attempt 1 pt translates too far forward resulting in uncontrolled sit maxA -Attempt 2 pt able to stand up, but unable to maintain knees locked, controlled sit maxA -Attempt 3 pt able to stand up, but again unable to maintain knees locked even w/ minA to push into full extension, compensating w/ inc UE reliance x2 minutes prior to controlled sit modA  Performed STS and ambulation in //  bars: -Attempt 1 modA using bars to walk hands into standing and PT blocking knees, pt maintains increased forward flexion -Attempt 2 modA STS into CGA for ambulation x8 ft w/ modA to prevent knee buckling w/ turn, prolonged seated recovery  -Provided therapeutic listening and encouragement due to pt tearful reporting today is tougher day physically and her low back makes standing and walking hard because she cannot pull her hips forward enough -Attempt 3 STS modA w/ 8 ft amb and pt getting bil  prosthetics tangled during turn requiring modA to maintain stance until chair brought behind for safe sit  PATIENT EDUCATION: Education details: Continue HEP. Person educated: Patient and SO Education method: Medical Illustrator Education comprehension: verbalized understanding and needs further education  HOME EXERCISE PROGRAM: -practice donning/doffing prosthetics as much as possible   GOALS: Goals reviewed with patient? Yes  SHORT TERM GOALS: Target date: 12/25/23  Pt will be independent with initial HEP for improved functional mobility and independence with new prosthetics  Baseline: needs to be updated - pt progressing to safety w/ standing program at home (11/24) Goal status: IN PROGRESS  2.  Patient will complete sit <> stand with LRAD and no more than MinA consistently  Baseline: up to MaxA in // bars; maxA to bariatric walker (11/24) Goal status: IN PROGRESS  3.  Patient will initiate ambulation with an ambulatory device with assist as needed Baseline: only able to ambulate in // bars; ambulates x12 ft w/ CGA and w/c follow (11/24) Goal status: MET  4.  Patient will be able to don/doff B prosthetics with no more than MinA  Baseline: MaxA; IND (11/24) Goal status: MET  LONG TERM GOALS: Target date: 02/19/24  Pt will be independent with final HEP for improved functional mobility and independence with new prosthetics  Baseline: to be provided Goal status: INITIAL  2.  Patient will complete sit <> stand with LRAD with no more than supervision consistently  Baseline: MaxA in // bars  Goal status: INITIAL  3.  Patient will ambulate >/=23ft with ambulatory device and no more than MinA Baseline: unable-only ambulating in // bars Goal status: REVISED  4.  Patient will traverse at least 1 community obstacle (curb vs ramp) with LRAD and no more than CGA Baseline: unable on eval  Goal status: INITIAL  5.  Patient will don/doff B prosthetics independently   Baseline: MaxA Goal status: INITIAL   ASSESSMENT:  CLINICAL IMPRESSION: Today's session focused on performing sit to stand more independently at countertop but patient requires max A x 1 due to poor control. Pt advised to wear prosthesis for longer period of time. Pt educated to use stubbies more time.   OBJECTIVE IMPAIRMENTS: Abnormal gait, cardiopulmonary status limiting activity, decreased activity tolerance, decreased balance, decreased endurance, decreased knowledge of use of DME, decreased mobility, difficulty walking, decreased strength, impaired vision/preception, and prosthetic dependency .   ACTIVITY LIMITATIONS: carrying, lifting, bending, standing, squatting, stairs, locomotion level, and caring for others  PARTICIPATION LIMITATIONS: meal prep, cleaning, interpersonal relationship, driving, shopping, community activity, and occupation  PERSONAL FACTORS: Age, Behavior pattern, Fitness, Past/current experiences, Social background, Time since onset of injury/illness/exacerbation, Transportation, and 3+ comorbidities: see above are also affecting patient's functional outcome.   REHAB POTENTIAL: Fair complexity of comorbidities   CLINICAL DECISION MAKING: Evolving/moderate complexity  EVALUATION COMPLEXITY: Moderate  PLAN:  PT FREQUENCY: 2x/week  PT DURATION: 12 weeks  PLANNED INTERVENTIONS: [id number]- PT Re-evaluation, 97750- Physical Performance Testing, 97110-Therapeutic exercises, 97530- Therapeutic activity, [id number]- Neuromuscular re-education,  [id number]- Self Care, [id number]- Manual therapy, [id number]- Gait training, [id number]- Orthotic Initial, [id number]- Prosthetic Initial , [id number]- Orthotic/Prosthetic subsequent, [id number]- Canalith repositioning, [id number]- Aquatic Therapy, [phone] (1-2 muscles), [id number] (3+ muscles)- Dry Needling, Patient/Family education, Balance training, Stair training, Vestibular training, Visual/preceptual remediation/compensation, DME instructions, Wheelchair mobility training,  Cryotherapy, and Moist heat  PLAN FOR NEXT SESSION: sink HEP practice, Continue to problem shoot L rotation from prosthetic or toeing in - normal toeing in? Or influence of some rotation from prosthetic? Need more socks?  Practice straight pathways for endurance and add in turns for increased challenge.  3 ply full length sock was added - further trouble shooting needed for decreasing IR of L prosthetic. Pt will also talk to hanger about this at her upcoming visit.   Plan for re-cert 2x/wk for 8 wks at 12/29 visit?  Daved KATHEE Bull, PT, DPT  02/04/2024, 3:34 PM

## 2024-02-05 ENCOUNTER — Ambulatory Visit (HOSPITAL_COMMUNITY)
Admission: RE | Admit: 2024-02-05 | Discharge: 2024-02-05 | Disposition: A | Discharge status: Home / Self Care | Source: Ambulatory Visit | Attending: Nephrology | Admitting: Nephrology

## 2024-02-05 VITALS — BP 157/102 | HR 91 | Temp 97.0°F | Resp 16

## 2024-02-05 DIAGNOSIS — N184 Chronic kidney disease, stage 4 (severe): Secondary | ICD-10-CM | POA: Diagnosis present

## 2024-02-05 DIAGNOSIS — D631 Anemia in chronic kidney disease: Secondary | ICD-10-CM | POA: Insufficient documentation

## 2024-02-05 LAB — IRON AND TIBC
Iron: 60 ug/dL (ref 28–170)
Saturation Ratios: 25 % (ref 10.4–31.8)
TIBC: 239 ug/dL — ABNORMAL LOW (ref 250–450)
UIBC: 180 ug/dL

## 2024-02-05 LAB — FERRITIN: Ferritin: 487 ng/mL — ABNORMAL HIGH (ref 11–307)

## 2024-02-05 LAB — POCT HEMOGLOBIN-HEMACUE: Hemoglobin: 9.7 g/dL — ABNORMAL LOW (ref 12.0–15.0)

## 2024-02-05 MED ORDER — EPOETIN ALFA-EPBX 10000 UNIT/ML IJ SOLN
INTRAMUSCULAR | Status: AC
  Filled 2024-02-05: qty 1

## 2024-02-05 MED ORDER — EPOETIN ALFA-EPBX 20000 UNIT/ML IJ SOLN
INTRAMUSCULAR | Status: AC
  Filled 2024-02-05: qty 1

## 2024-02-05 MED ORDER — EPOETIN ALFA-EPBX 10000 UNIT/ML IJ SOLN
10000.0000 [IU] | Freq: Once | INTRAMUSCULAR | Status: AC
  Administered 2024-02-05: 10000 [IU] via SUBCUTANEOUS

## 2024-02-08 ENCOUNTER — Ambulatory Visit

## 2024-02-08 DIAGNOSIS — R262 Difficulty in walking, not elsewhere classified: Secondary | ICD-10-CM

## 2024-02-08 DIAGNOSIS — M6281 Muscle weakness (generalized): Secondary | ICD-10-CM

## 2024-02-08 DIAGNOSIS — R2689 Other abnormalities of gait and mobility: Secondary | ICD-10-CM

## 2024-02-09 NOTE — Therapy (Signed)
 " OUTPATIENT PHYSICAL THERAPY NEURO TREATMENT   Patient Name: Kelli Hudson MRN: [id number] DOB:03-Mar-1990, 33 y.o., female Today's Date: 02/09/2024   PCP: Daphne Lesches, NP REFERRING PROVIDER: Jerona Sage, MD  END OF SESSION:  PT End of Session - 02/09/24 1216     Visit Number 18    Number of Visits 25    Date for Recertification  02/19/24    Authorization Type [employer] dual    Progress Note Due on Visit 10    PT Start Time 1315    PT Stop Time 1400    PT Time Calculation (min) 45 min    Equipment Utilized During Treatment Gait belt    Activity Tolerance Patient tolerated treatment well    Behavior During Therapy WFL for tasks assessed/performed              Past Medical History:  Diagnosis Date   Acute H. pylori gastric ulcer    Chronic kidney disease    Coffee ground emesis    Diabetes mellitus (HCC)    Diabetic gastroparesis (HCC)    DKA (diabetic ketoacidosis) (HCC) 02/24/2021   Gastroparesis    GERD (gastroesophageal reflux disease)    Hypertension    Hyperthyroidism    Intractable nausea and vomiting 04/20/2021   Normocytic anemia 04/20/2020   Prolonged Q-T interval on ECG    Past Surgical History:  Procedure Laterality Date   AMPUTATION Bilateral 10/24/2022   Procedure: BILATERAL BELOW KNEE AMPUTATION;  Surgeon: Sage Jerona GAILS, MD;  Location: [facility] OR;  Service: Orthopedics;  Laterality: Bilateral;   AMPUTATION Bilateral 10/08/2022   Procedure: BILATERAL LEG DEBRIDEMENT;  Surgeon: Sage Jerona GAILS, MD;  Location: [facility] OR;  Service: Orthopedics;  Laterality: Bilateral;   AMPUTATION Bilateral 11/14/2022   Procedure: BILATERAL ABOVE KNEE AMPUTATION;  Surgeon: Sage Jerona GAILS, MD;  Location: [facility] OR;  Service: Orthopedics;  Laterality: Bilateral;   AMPUTATION TOE Left 03/10/2018   Procedure: AMPUTATION FIFTH TOE;  Surgeon: Gershon Donnice SAUNDERS, DPM;  Location: [facility];  Service: Podiatry;  Laterality: Left;   APPLICATION OF WOUND VAC  Bilateral 10/12/2022   Procedure: APPLICATION OF WOUND VAC BILATERAL LEGS;  Surgeon: Celena Sharper, MD;  Location: [facility] OR;  Service: Orthopedics;  Laterality: Bilateral;   APPLICATION OF WOUND VAC Bilateral 10/24/2022   Procedure: APPLICATION OF WOUND VAC;  Surgeon: Sage Jerona GAILS, MD;  Location: [facility] OR;  Service: Orthopedics;  Laterality: Bilateral;   BIOPSY  01/28/2020   Procedure: BIOPSY;  Surgeon: Elicia Claw, MD;  Location: [facility];  Service: Gastroenterology;;   BOTOX  INJECTION  08/26/2021   Procedure: BOTOX  INJECTION;  Surgeon: Legrand Victory LITTIE DOUGLAS, MD;  Location: [facility];  Service: Gastroenterology;;   ESOPHAGOGASTRODUODENOSCOPY N/A 01/28/2020   Procedure: ESOPHAGOGASTRODUODENOSCOPY (EGD);  Surgeon: Elicia Claw, MD;  Location: [facility];  Service: Gastroenterology;  Laterality: N/A;   ESOPHAGOGASTRODUODENOSCOPY (EGD) WITH PROPOFOL  Left 09/08/2015   Procedure: ESOPHAGOGASTRODUODENOSCOPY (EGD) WITH PROPOFOL ;  Surgeon: Elsie Cree, MD;  Location: [facility];  Service: Endoscopy;  Laterality: Left;   ESOPHAGOGASTRODUODENOSCOPY (EGD) WITH PROPOFOL  N/A 08/26/2021   Procedure: ESOPHAGOGASTRODUODENOSCOPY (EGD) WITH PROPOFOL ;  Surgeon: Legrand Victory LITTIE DOUGLAS, MD;  Location: [facility];  Service: Gastroenterology;  Laterality: N/A;   GASTRIC STIMULATOR IMPLANT SURGERY  06/2022   at [facility]   I & D EXTREMITY Bilateral 10/12/2022   Procedure: IRRIGATION AND  DEBRIDEMENT  BILATERAL LEGS;  Surgeon: Celena Sharper, MD;  Location: [facility] OR;  Service: Orthopedics;  Laterality: Bilateral;   I & D  EXTREMITY Right 10/13/2022   Procedure: RIGHT THIGH WOUND VAC EXCHANGE;  Surgeon: Kendal Franky SQUIBB, MD;  Location: [facility] OR;  Service: Orthopedics;  Laterality: Right;   I & D EXTREMITY Bilateral 10/17/2022   Procedure: BILATERAL THIGH AND LEG DEBRIDEMENT, PARTIAL BILATERAL FOOT AMPUTATION;  Surgeon: Harden Jerona GAILS, MD;  Location: [facility] OR;  Service: Orthopedics;  Laterality: Bilateral;   I & D EXTREMITY  Bilateral 11/05/2022   Procedure: BILATERAL THIGH DEBRIDEMENT;  Surgeon: Harden Jerona GAILS, MD;  Location: [facility] OR;  Service: Orthopedics;  Laterality: Bilateral;   I & D EXTREMITY Bilateral 11/07/2022   Procedure: BILATERAL THIGH AND LEG DEBRIDEMENT;  Surgeon: Harden Jerona GAILS, MD;  Location: [facility] OR;  Service: Orthopedics;  Laterality: Bilateral;   I & D EXTREMITY Bilateral 11/12/2022   Procedure: BILATERAL LEG DEBRIDEMENTS;  Surgeon: Harden Jerona GAILS, MD;  Location: [facility] OR;  Service: Orthopedics;  Laterality: Bilateral;   IRRIGATION AND DEBRIDEMENT FOOT Bilateral 10/05/2022   Procedure: IRRIGATION AND DEBRIDEMENT FOOT;  Surgeon: Janit Thresa HERO, DPM;  Location: [facility];  Service: Orthopedics/Podiatry;  Laterality: Bilateral;   WISDOM TOOTH EXTRACTION     Patient Active Problem List   Diagnosis Date Noted   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   Hypokalemia 11/27/2022   Effusion, left knee 11/05/2022   MDD (major depressive disorder), recurrent episode, moderate (HCC) 10/16/2022   Necrotizing fasciitis of pelvic region and thigh (HCC) 10/08/2022   Necrotizing fasciitis of lower leg (HCC) 10/08/2022   Necrotizing fasciitis (HCC) 10/08/2022   Sepsis due to cellulitis (HCC) 10/03/2022   Cellulitis of lower extremity 10/03/2022   Multinodular goiter 06/18/2022   Malnutrition of moderate degree 04/18/2022   Intractable vomiting with nausea 04/17/2022   DKA (diabetic ketoacidosis) (HCC) 03/04/2022   Nausea vomiting and diarrhea 12/25/2021   Diabetic gastroparesis (HCC)  10/09/2021   Drug-seeking behavior 09/21/2021   Essential hypertension 08/25/2021   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) 08/25/2021   GERD without esophagitis 08/25/2021   Hematemesis    Prolonged Q-T interval on ECG    Nausea and vomiting 07/20/2021   Anemia of chronic disease 06/06/2021   Dehydration 06/04/2021   Hypomagnesemia 04/21/2021   Ulcerated, foot, left, with fat layer exposed (HCC) 04/20/2021   Uncontrolled type 1 diabetes mellitus with hyperglycemia, with long-term current use of insulin  (HCC) 12/18/2020   DM type 1 (diabetes mellitus, type 1) (HCC) 12/18/2020   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 11/09/2020   Diabetic retinopathy of both eyes associated with type 1 diabetes mellitus (HCC) 09/24/2020   Diabetic gastroparesis associated with type 1 diabetes mellitus (HCC) 08/12/2020   Acute kidney injury superimposed on chronic kidney disease 07/25/2020   Generalized abdominal pain 07/23/2020   GERD (gastroesophageal reflux disease) 07/23/2020   Diabetic nephropathy associated with type 1 diabetes mellitus (HCC) 06/27/2020   Sepsis (HCC) 05/27/2020   HLD (hyperlipidemia) 05/23/2020   Sinus tachycardia 05/09/2020   Anemia 04/20/2020   Depression with anxiety 07/05/2019   Diabetic polyneuropathy associated with type 1 diabetes mellitus (HCC) 09/24/2017  Gastroparesis    Goiter 10/05/2009   DM2 (diabetes mellitus, type 2) (HCC) 10/05/2009   Hypertension associated with diabetes (HCC) 10/05/2009    ONSET DATE: 11/02/23 referral   REFERRING DIAG: [phone] (ICD-10-CM) - S/P AKA (above knee amputation) bilateral (HCC)   THERAPY DIAG:  Difficulty in walking, not elsewhere classified  Muscle weakness (generalized)  Other abnormalities of gait and mobility  Rationale for Evaluation and Treatment: Rehabilitation  SUBJECTIVE:                                                                                                                                                                                              SUBJECTIVE STATEMENT:  Pt arrived with bil prosthetics donned. She is wearing 5 ply in R socket and no ply in L socket.   Pt accompanied by: significant other  PERTINENT HISTORY: DM, diabetic gastroparesis, GERD, HTN, hyperthyroidism, normocytic anemia, B AKA  PAIN:  Are you having pain? No  PRECAUTIONS: Fall   PATIENT GOALS: to learn how to walk with new prosthetics                                                                                                                             TREATMENT  Pt transferred from chair to mat with scoot transfer with CGA Pt performed sit to stand transfer with mod A from elevated mat table at 25 high: performed 5 rep, VC to lean chest forward and thrust with hips forward during standing up and pushing legs into back of the socket to provide extension movement at hips.  Patient then started ambulation with bariatric walker. Pt required mod A with balance and VC required to improve step length. Pt needed cues to transfer weight to ball of prosthetic foot to allow knee to bend and then shift weight anteriorly during left swing phase to allow for L heel contact in front of R foot. Pt required mod A during ambulation. Pt performed 20 feet, 30 feet, 30 feet, 50 feet of ambulation during the session with 2nd person with wheelchair follow  After ambulation,  pt was sat back down in her wheelchair with 2 airex pads for elevated height. Pt required max A x 2 to stand up from wheelchair as she buckled bil knees 3x during transfers   PATIENT EDUCATION: Education details: Continue HEP. Person educated: Patient and SO Education method: Medical Illustrator Education comprehension: verbalized understanding and needs further education  HOME EXERCISE PROGRAM: -practice donning/doffing prosthetics as much as possible   GOALS: Goals reviewed with patient?  Yes  SHORT TERM GOALS: Target date: 12/25/23  Pt will be independent with initial HEP for improved functional mobility and independence with new prosthetics  Baseline: needs to be updated - pt progressing to safety w/ standing program at home (11/24) Goal status: IN PROGRESS  2.  Patient will complete sit <> stand with LRAD and no more than MinA consistently  Baseline: up to MaxA in // bars; maxA to bariatric walker (11/24) Goal status: IN PROGRESS  3.  Patient will initiate ambulation with an ambulatory device with assist as needed Baseline: only able to ambulate in // bars; ambulates x12 ft w/ CGA and w/c follow (11/24) Goal status: MET  4.  Patient will be able to don/doff B prosthetics with no more than MinA  Baseline: MaxA; IND (11/24) Goal status: MET  LONG TERM GOALS: Target date: 02/19/24  Pt will be independent with final HEP for improved functional mobility and independence with new prosthetics  Baseline: to be provided Goal status: INITIAL  2.  Patient will complete sit <> stand with LRAD with no more than supervision consistently  Baseline: MaxA in // bars  Goal status: INITIAL  3.  Patient will ambulate >/=19ft with ambulatory device and no more than MinA Baseline: unable-only ambulating in // bars Goal status: REVISED  4.  Patient will traverse at least 1 community obstacle (curb vs ramp) with LRAD and no more than CGA Baseline: unable on eval  Goal status: INITIAL  5.  Patient will don/doff B prosthetics independently  Baseline: MaxA Goal status: INITIAL   ASSESSMENT:  CLINICAL IMPRESSION: Pt demo improved walking endurance today with requiring mod a with ambulation portion. Pt still rquires max A x 2 to stand up from standard or elevated heights due to poor prosthetic control during transfers which pose safety risk. Pt will continue to benefit from skilled PT to improve transfers with decreasing level or assistance and improve ambulation   OBJECTIVE  IMPAIRMENTS: Abnormal gait, cardiopulmonary status limiting activity, decreased activity tolerance, decreased balance, decreased endurance, decreased knowledge of use of DME, decreased mobility, difficulty walking, decreased strength, impaired vision/preception, and prosthetic dependency .   ACTIVITY LIMITATIONS: carrying, lifting, bending, standing, squatting, stairs, locomotion level, and caring for others  PARTICIPATION LIMITATIONS: meal prep, cleaning, interpersonal relationship, driving, shopping, community activity, and occupation  PERSONAL FACTORS: Age, Behavior pattern, Fitness, Past/current experiences, Social background, Time since onset of injury/illness/exacerbation, Transportation, and 3+ comorbidities: see above are also affecting patient's functional outcome.   REHAB POTENTIAL: Fair complexity of comorbidities   CLINICAL DECISION MAKING: Evolving/moderate complexity  EVALUATION COMPLEXITY: Moderate  PLAN:  PT FREQUENCY: 2x/week  PT DURATION: 12 weeks  PLANNED INTERVENTIONS: [id number]- PT Re-evaluation, 97750- Physical Performance Testing, 97110-Therapeutic exercises, 97530- Therapeutic activity, [id number]- Neuromuscular re-education, 97535- Self Care, [id number]- Manual therapy, [id number]- Gait training, [phone]- Orthotic Initial, [id number]- Prosthetic Initial , [phone]- Orthotic/Prosthetic subsequent, [phone]- Canalith repositioning, [id number]- Aquatic Therapy, [phone] (1-2 muscles), [id number] (3+ muscles)- Dry Needling, Patient/Family education, Balance training, Stair training, Vestibular training, Visual/preceptual remediation/compensation, DME instructions, Wheelchair mobility training, Cryotherapy,  and Moist heat  PLAN FOR NEXT SESSION: sink HEP practice, Continue to problem shoot L rotation from prosthetic or toeing in - normal toeing in? Or influence of some rotation from prosthetic? Need more socks?  Practice straight pathways for endurance and add in turns for increased challenge.  3 ply full length sock  was added - further trouble shooting needed for decreasing IR of L prosthetic. Pt will also talk to hanger about this at her upcoming visit.   Plan for re-cert 2x/wk for 8 wks at 12/29 visit?  Raj LOISE Blanch, PT, DPT  02/09/2024, 12:16 PM        "

## 2024-02-15 ENCOUNTER — Ambulatory Visit

## 2024-02-15 DIAGNOSIS — R2689 Other abnormalities of gait and mobility: Secondary | ICD-10-CM

## 2024-02-15 DIAGNOSIS — M6281 Muscle weakness (generalized): Secondary | ICD-10-CM | POA: Diagnosis not present

## 2024-02-15 DIAGNOSIS — R262 Difficulty in walking, not elsewhere classified: Secondary | ICD-10-CM

## 2024-02-15 NOTE — Therapy (Signed)
 " OUTPATIENT PHYSICAL THERAPY NEURO TREATMENT   Patient Name: Kelli Hudson MRN: [id number] DOB:Jun 27, 1990, 33 y.o., female Today's Date: 02/15/2024   PCP: Daphne Lesches, NP REFERRING PROVIDER: Jerona Sage, MD  END OF SESSION:  PT End of Session - 02/15/24 1406     Visit Number 19    Number of Visits 25    Date for Recertification  02/19/24    Authorization Type UHC dual    Progress Note Due on Visit 10    PT Start Time 1320    PT Stop Time 1350    PT Time Calculation (min) 30 min    Equipment Utilized During Treatment Gait belt    Activity Tolerance Patient tolerated treatment well    Behavior During Therapy WFL for tasks assessed/performed               Past Medical History:  Diagnosis Date   Acute H. pylori gastric ulcer    Chronic kidney disease    Coffee ground emesis    Diabetes mellitus (HCC)    Diabetic gastroparesis (HCC)    DKA (diabetic ketoacidosis) (HCC) 02/24/2021   Gastroparesis    GERD (gastroesophageal reflux disease)    Hypertension    Hyperthyroidism    Intractable nausea and vomiting 04/20/2021   Normocytic anemia 04/20/2020   Prolonged Q-T interval on ECG    Past Surgical History:  Procedure Laterality Date   AMPUTATION Bilateral 10/24/2022   Procedure: BILATERAL BELOW KNEE AMPUTATION;  Surgeon: Sage Jerona GAILS, MD;  Location: [facility] OR;  Service: Orthopedics;  Laterality: Bilateral;   AMPUTATION Bilateral 10/08/2022   Procedure: BILATERAL LEG DEBRIDEMENT;  Surgeon: Sage Jerona GAILS, MD;  Location: [facility] OR;  Service: Orthopedics;  Laterality: Bilateral;   AMPUTATION Bilateral 11/14/2022   Procedure: BILATERAL ABOVE KNEE AMPUTATION;  Surgeon: Sage Jerona GAILS, MD;  Location: [facility] OR;  Service: Orthopedics;  Laterality: Bilateral;   AMPUTATION TOE Left 03/10/2018   Procedure: AMPUTATION FIFTH TOE;  Surgeon: Gershon Donnice SAUNDERS, DPM;  Location: [facility];  Service: Podiatry;  Laterality: Left;   APPLICATION OF WOUND VAC  Bilateral 10/12/2022   Procedure: APPLICATION OF WOUND VAC BILATERAL LEGS;  Surgeon: Celena Sharper, MD;  Location: [facility] OR;  Service: Orthopedics;  Laterality: Bilateral;   APPLICATION OF WOUND VAC Bilateral 10/24/2022   Procedure: APPLICATION OF WOUND VAC;  Surgeon: Sage Jerona GAILS, MD;  Location: [facility] OR;  Service: Orthopedics;  Laterality: Bilateral;   BIOPSY  01/28/2020   Procedure: BIOPSY;  Surgeon: Elicia Claw, MD;  Location: [facility];  Service: Gastroenterology;;   BOTOX  INJECTION  08/26/2021   Procedure: BOTOX  INJECTION;  Surgeon: Legrand Victory LITTIE DOUGLAS, MD;  Location: [facility];  Service: Gastroenterology;;   ESOPHAGOGASTRODUODENOSCOPY N/A 01/28/2020   Procedure: ESOPHAGOGASTRODUODENOSCOPY (EGD);  Surgeon: Elicia Claw, MD;  Location: [facility];  Service: Gastroenterology;  Laterality: N/A;   ESOPHAGOGASTRODUODENOSCOPY (EGD) WITH PROPOFOL  Left 09/08/2015   Procedure: ESOPHAGOGASTRODUODENOSCOPY (EGD) WITH PROPOFOL ;  Surgeon: Elsie Cree, MD;  Location: [facility] ENDOSCOPY;  Service: Endoscopy;  Laterality: Left;   ESOPHAGOGASTRODUODENOSCOPY (EGD) WITH PROPOFOL  N/A 08/26/2021   Procedure: ESOPHAGOGASTRODUODENOSCOPY (EGD) WITH PROPOFOL ;  Surgeon: Legrand Victory LITTIE DOUGLAS, MD;  Location: [facility];  Service: Gastroenterology;  Laterality: N/A;   GASTRIC STIMULATOR IMPLANT SURGERY  06/2022   at [facility]   I & D EXTREMITY Bilateral 10/12/2022   Procedure: IRRIGATION AND  DEBRIDEMENT  BILATERAL LEGS;  Surgeon: Celena Sharper, MD;  Location: [facility] OR;  Service: Orthopedics;  Laterality: Bilateral;   I &  D EXTREMITY Right 10/13/2022   Procedure: RIGHT THIGH WOUND VAC EXCHANGE;  Surgeon: Kendal Franky SQUIBB, MD;  Location: [facility] OR;  Service: Orthopedics;  Laterality: Right;   I & D EXTREMITY Bilateral 10/17/2022   Procedure: BILATERAL THIGH AND LEG DEBRIDEMENT, PARTIAL BILATERAL FOOT AMPUTATION;  Surgeon: Harden Jerona GAILS, MD;  Location: [facility] OR;  Service: Orthopedics;  Laterality: Bilateral;   I & D EXTREMITY  Bilateral 11/05/2022   Procedure: BILATERAL THIGH DEBRIDEMENT;  Surgeon: Harden Jerona GAILS, MD;  Location: [facility] OR;  Service: Orthopedics;  Laterality: Bilateral;   I & D EXTREMITY Bilateral 11/07/2022   Procedure: BILATERAL THIGH AND LEG DEBRIDEMENT;  Surgeon: Harden Jerona GAILS, MD;  Location: [facility] OR;  Service: Orthopedics;  Laterality: Bilateral;   I & D EXTREMITY Bilateral 11/12/2022   Procedure: BILATERAL LEG DEBRIDEMENTS;  Surgeon: Harden Jerona GAILS, MD;  Location: [facility] OR;  Service: Orthopedics;  Laterality: Bilateral;   IRRIGATION AND DEBRIDEMENT FOOT Bilateral 10/05/2022   Procedure: IRRIGATION AND DEBRIDEMENT FOOT;  Surgeon: Janit Thresa HERO, DPM;  Location: [facility];  Service: Orthopedics/Podiatry;  Laterality: Bilateral;   WISDOM TOOTH EXTRACTION     Patient Active Problem List   Diagnosis Date Noted   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   Hypokalemia 11/27/2022   Effusion, left knee 11/05/2022   MDD (major depressive disorder), recurrent episode, moderate (HCC) 10/16/2022   Necrotizing fasciitis of pelvic region and thigh (HCC) 10/08/2022   Necrotizing fasciitis of lower leg (HCC) 10/08/2022   Necrotizing fasciitis (HCC) 10/08/2022   Sepsis due to cellulitis (HCC) 10/03/2022   Cellulitis of lower extremity 10/03/2022   Multinodular goiter 06/18/2022   Malnutrition of moderate degree 04/18/2022   Intractable vomiting with nausea 04/17/2022   DKA (diabetic ketoacidosis) (HCC) 03/04/2022   Nausea vomiting and diarrhea 12/25/2021   Diabetic gastroparesis (HCC)  10/09/2021   Drug-seeking behavior 09/21/2021   Essential hypertension 08/25/2021   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) 08/25/2021   GERD without esophagitis 08/25/2021   Hematemesis    Prolonged Q-T interval on ECG    Nausea and vomiting 07/20/2021   Anemia of chronic disease 06/06/2021   Dehydration 06/04/2021   Hypomagnesemia 04/21/2021   Ulcerated, foot, left, with fat layer exposed (HCC) 04/20/2021   Uncontrolled type 1 diabetes mellitus with hyperglycemia, with long-term current use of insulin  (HCC) 12/18/2020   DM type 1 (diabetes mellitus, type 1) (HCC) 12/18/2020   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 11/09/2020   Diabetic retinopathy of both eyes associated with type 1 diabetes mellitus (HCC) 09/24/2020   Diabetic gastroparesis associated with type 1 diabetes mellitus (HCC) 08/12/2020   Acute kidney injury superimposed on chronic kidney disease 07/25/2020   Generalized abdominal pain 07/23/2020   GERD (gastroesophageal reflux disease) 07/23/2020   Diabetic nephropathy associated with type 1 diabetes mellitus (HCC) 06/27/2020   Sepsis (HCC) 05/27/2020   HLD (hyperlipidemia) 05/23/2020   Sinus tachycardia 05/09/2020   Anemia 04/20/2020   Depression with anxiety 07/05/2019   Diabetic polyneuropathy associated with type 1 diabetes mellitus (HCC) 09/24/2017  Gastroparesis    Goiter 10/05/2009   DM2 (diabetes mellitus, type 2) (HCC) 10/05/2009   Hypertension associated with diabetes (HCC) 10/05/2009    ONSET DATE: 11/02/23 referral   REFERRING DIAG: [phone] (ICD-10-CM) - S/P AKA (above knee amputation) bilateral (HCC)   THERAPY DIAG:  Difficulty in walking, not elsewhere classified  Muscle weakness (generalized)  Other abnormalities of gait and mobility  Rationale for Evaluation and Treatment: Rehabilitation  SUBJECTIVE:                                                                                                                                                                                              SUBJECTIVE STATEMENT:  Pt arrived with bil prosthetics donned. She is wearing 5 ply in R socket and no ply in L socket.   Pt accompanied by: significant other  PERTINENT HISTORY: DM, diabetic gastroparesis, GERD, HTN, hyperthyroidism, normocytic anemia, B AKA  PAIN:  Are you having pain? No  PRECAUTIONS: Fall   PATIENT GOALS: to learn how to walk with new prosthetics                                                                                                                             TREATMENT  Pt transferred from chair to mat with scoot transfer with CGA Pt performed sit to stand transfer with mod A from elevated mat table at 27 high: performed 5 rep, VC to lean chest forward and thrust with hips forward during standing up and pushing legs into back of the socket to provide extension movement at hips.  Pt was transferred back to wheelchair with 4 step with 2 cushion on top to bring to 27 height in her wheelchair.  Patient then started ambulation with bariatric walker. Pt required mod A with balance and VC required to improve step length. Pt's R prosthetic kept turning inward so she required adjustments after every rest break. During the second rest break after walking 35 feet, while PT was pulling on the strap, her R hook (where the strap goes in on the socket), broke off  the rivet. We stopped ambulation. Pt performed 20 feet, 35 feet  Pt was transferred back to mat table to remove step and cushion and then she transferred back into her chair.  [facility] in [place] was called and her husband will drop off her prosthetic leg there for them to work on installing new hook for the strap.  A  PATIENT EDUCATION: Education details: Continue HEP. Person educated: Patient and SO Education method: Medical Illustrator Education comprehension: verbalized understanding and needs further  education  HOME EXERCISE PROGRAM: -practice donning/doffing prosthetics as much as possible   GOALS: Goals reviewed with patient? Yes  SHORT TERM GOALS: Target date: 03/14/2024    Pt will be independent with initial HEP for improved functional mobility and independence with new prosthetics  Baseline: needs to be updated - pt progressing to safety w/ standing program at home (11/24) Goal status: IN PROGRESS  2.  Patient will complete sit <> stand with LRAD and no more than MinA consistently  Baseline: up to MaxA in // bars; maxA to bariatric walker (11/24); able to stand from 27 high surface with mod A Goal status: IN PROGRESS  3.  Patient will initiate ambulation with an ambulatory device with assist as needed Baseline: only able to ambulate in // bars; ambulates x12 ft w/ CGA and w/c follow (11/24) Goal status: MET  4.  Patient will be able to don/doff B prosthetics with no more than MinA  Baseline: MaxA; IND (11/24) Goal status: MET  LONG TERM GOALS: Target date: 04/25/2024    Pt will be independent with final HEP for improved functional mobility and independence with new prosthetics  Baseline: to be provided Goal status: INITIAL  2.  Patient will complete sit <> stand with LRAD with no more than supervision consistently  Baseline: MaxA in // bars ; able to stand up from 27 high surface with mod A with bariatric walker (02/15/24) Goal status: progressing continue  3.  Patient will ambulate >/=10ft with ambulatory device and no more than MinA Baseline: unable-only ambulating in // bars Goal status: REVISED  4.  Patient will traverse at least 1 community obstacle (curb vs ramp) with LRAD and no more than CGA Baseline: unable on eval ; unable 02/15/24 Goal status: progressing  5.  Patient will don/doff B prosthetics independently  Baseline: MaxA, min A Goal status: progressing continue (02/15/24)   ASSESSMENT:  CLINICAL IMPRESSION: Pt's right prosthetic kept  turning in during the ambulation. Session limited due to broken hook of the R prosthetic socket while adjusting by PT. Pt to get that fixed before coming to PT for next session. Patient currently needs min A to bring prosthetics near her. Pt needs max A to stand up from standard height surface. Pt needs mod to max A for safety with ambulation with walker.   OBJECTIVE IMPAIRMENTS: Abnormal gait, cardiopulmonary status limiting activity, decreased activity tolerance, decreased balance, decreased endurance, decreased knowledge of use of DME, decreased mobility, difficulty walking, decreased strength, impaired vision/preception, and prosthetic dependency .   ACTIVITY LIMITATIONS: carrying, lifting, bending, standing, squatting, stairs, locomotion level, and caring for others  PARTICIPATION LIMITATIONS: meal prep, cleaning, interpersonal relationship, driving, shopping, community activity, and occupation  PERSONAL FACTORS: Age, Behavior pattern, Fitness, Past/current experiences, Social background, Time since onset of injury/illness/exacerbation, Transportation, and 3+ comorbidities: see above are also affecting patient's functional outcome.   REHAB POTENTIAL: Fair complexity of comorbidities   CLINICAL DECISION MAKING: Evolving/moderate complexity  EVALUATION COMPLEXITY: Moderate  PLAN:  PT FREQUENCY:  2x/week  PT DURATION: 12 weeks  PLANNED INTERVENTIONS: 97164- PT Re-evaluation, 97750- Physical Performance Testing, 97110-Therapeutic exercises, 97530- Therapeutic activity, [id number]- Neuromuscular re-education, 97535- Self Care, [id number]- Manual therapy, [id number]- Gait training, [phone]- Orthotic Initial, [phone]- Prosthetic Initial , [phone]- Orthotic/Prosthetic subsequent, [phone]- Canalith repositioning, [phone]- Aquatic Therapy, [phone] (1-2 muscles), [id number] (3+ muscles)- Dry Needling, Patient/Family education, Balance training, Stair training, Vestibular training, Visual/preceptual remediation/compensation, DME  instructions, Wheelchair mobility training, Cryotherapy, and Moist heat  PLAN FOR NEXT SESSION: sink HEP practice, Continue to problem shoot L rotation from prosthetic or toeing in - normal toeing in? Or influence of some rotation from prosthetic? Need more socks?  Practice straight pathways for endurance and add in turns for increased challenge.  3 ply full length sock was added - further trouble shooting needed for decreasing IR of L prosthetic. Pt will also talk to [facility] about this at her upcoming visit.   Plan for re-cert 2x/wk for 8 wks at 12/29 visit?  Raj LOISE Blanch, PT, DPT  02/15/2024, 2:07 PM        "

## 2024-02-15 NOTE — Addendum Note (Signed)
 Addended by: TOBIE RAJ SAILOR on: 02/15/2024 03:02 PM   Modules accepted: Orders

## 2024-02-19 ENCOUNTER — Encounter (HOSPITAL_COMMUNITY)

## 2024-02-19 ENCOUNTER — Telehealth (HOSPITAL_COMMUNITY): Payer: Self-pay

## 2024-02-19 NOTE — Telephone Encounter (Signed)
 Auth Submission: NO AUTH NEEDED Site of care: [facility] Payer: [employer] Medicare, [employer] Medication & CPT/J Code(s) submitted: Retacrit  ([id number]) Diagnosis Code: N18.4/D63.1 Route of submission (phone, fax, portal): portal Phone # Fax # Auth type: Buy/Bill HB Units/visits requested: 10000 units q2weeks Reference number: [id number] Approval from: 02/19/24 to 02/16/25

## 2024-02-22 ENCOUNTER — Ambulatory Visit: Attending: Orthopedic Surgery

## 2024-02-22 DIAGNOSIS — R2689 Other abnormalities of gait and mobility: Secondary | ICD-10-CM | POA: Diagnosis present

## 2024-02-22 DIAGNOSIS — M6281 Muscle weakness (generalized): Secondary | ICD-10-CM | POA: Insufficient documentation

## 2024-02-22 DIAGNOSIS — R293 Abnormal posture: Secondary | ICD-10-CM | POA: Insufficient documentation

## 2024-02-22 DIAGNOSIS — R262 Difficulty in walking, not elsewhere classified: Secondary | ICD-10-CM | POA: Diagnosis present

## 2024-02-22 DIAGNOSIS — R29898 Other symptoms and signs involving the musculoskeletal system: Secondary | ICD-10-CM | POA: Insufficient documentation

## 2024-02-23 NOTE — Therapy (Signed)
 " OUTPATIENT PHYSICAL THERAPY NEURO TREATMENT   Patient Name: Kelli Hudson MRN: [id number] DOB:08/25/1990, 34 y.o., female Today's Date: 02/23/2024   PCP: Daphne Lesches, NP REFERRING PROVIDER: Jerona Sage, MD  END OF SESSION:  PT End of Session - 02/23/24 0809     Visit Number 20    Number of Visits 25    Date for Recertification  04/25/24    Authorization Type [employer] dual    Progress Note Due on Visit 10    PT Start Time 1100    PT Stop Time 1145    PT Time Calculation (min) 45 min    Equipment Utilized During Treatment Gait belt    Activity Tolerance Patient tolerated treatment well    Behavior During Therapy WFL for tasks assessed/performed               Past Medical History:  Diagnosis Date   Acute H. pylori gastric ulcer    Chronic kidney disease    Coffee ground emesis    Diabetes mellitus (HCC)    Diabetic gastroparesis (HCC)    DKA (diabetic ketoacidosis) (HCC) 02/24/2021   Gastroparesis    GERD (gastroesophageal reflux disease)    Hypertension    Hyperthyroidism    Intractable nausea and vomiting 04/20/2021   Normocytic anemia 04/20/2020   Prolonged Q-T interval on ECG    Past Surgical History:  Procedure Laterality Date   AMPUTATION Bilateral 10/24/2022   Procedure: BILATERAL BELOW KNEE AMPUTATION;  Surgeon: Sage Jerona GAILS, MD;  Location: [facility] OR;  Service: Orthopedics;  Laterality: Bilateral;   AMPUTATION Bilateral 10/08/2022   Procedure: BILATERAL LEG DEBRIDEMENT;  Surgeon: Sage Jerona GAILS, MD;  Location: [facility] OR;  Service: Orthopedics;  Laterality: Bilateral;   AMPUTATION Bilateral 11/14/2022   Procedure: BILATERAL ABOVE KNEE AMPUTATION;  Surgeon: Sage Jerona GAILS, MD;  Location: [facility] OR;  Service: Orthopedics;  Laterality: Bilateral;   AMPUTATION TOE Left 03/10/2018   Procedure: AMPUTATION FIFTH TOE;  Surgeon: Gershon Donnice SAUNDERS, DPM;  Location: [facility];  Service: Podiatry;  Laterality: Left;   APPLICATION OF WOUND VAC  Bilateral 10/12/2022   Procedure: APPLICATION OF WOUND VAC BILATERAL LEGS;  Surgeon: Celena Sharper, MD;  Location: [facility] OR;  Service: Orthopedics;  Laterality: Bilateral;   APPLICATION OF WOUND VAC Bilateral 10/24/2022   Procedure: APPLICATION OF WOUND VAC;  Surgeon: Sage Jerona GAILS, MD;  Location: [facility] OR;  Service: Orthopedics;  Laterality: Bilateral;   BIOPSY  01/28/2020   Procedure: BIOPSY;  Surgeon: Elicia Claw, MD;  Location: [facility];  Service: Gastroenterology;;   BOTOX  INJECTION  08/26/2021   Procedure: BOTOX  INJECTION;  Surgeon: Legrand Victory LITTIE DOUGLAS, MD;  Location: [facility];  Service: Gastroenterology;;   ESOPHAGOGASTRODUODENOSCOPY N/A 01/28/2020   Procedure: ESOPHAGOGASTRODUODENOSCOPY (EGD);  Surgeon: Elicia Claw, MD;  Location: [facility];  Service: Gastroenterology;  Laterality: N/A;   ESOPHAGOGASTRODUODENOSCOPY (EGD) WITH PROPOFOL  Left 09/08/2015   Procedure: ESOPHAGOGASTRODUODENOSCOPY (EGD) WITH PROPOFOL ;  Surgeon: Elsie Cree, MD;  Location: [facility] END[facility]COPY;  Service: Endoscopy;  Laterality: Left;   ESOPHAGOGASTRODUODENOSCOPY (EGD) WITH PROPOFOL  N/A 08/26/2021   Procedure: ESOPHAGOGASTRODUODENOSCOPY (EGD) WITH PROPOFOL ;  Surgeon: Legrand Victory LITTIE DOUGLAS, MD;  Location: [facility];  Service: Gastroenterology;  Laterality: N/A;   GASTRIC STIMULATOR IMPLANT SURGERY  06/2022   at [facility]   I & D EXTREMITY Bilateral 10/12/2022   Procedure: IRRIGATION AND  DEBRIDEMENT  BILATERAL LEGS;  Surgeon: Celena Sharper, MD;  Location: [facility] OR;  Service: Orthopedics;  Laterality: Bilateral;   I &  D EXTREMITY Right 10/13/2022   Procedure: RIGHT THIGH WOUND VAC EXCHANGE;  Surgeon: Kendal Franky SQUIBB, MD;  Location: [facility] OR;  Service: Orthopedics;  Laterality: Right;   I & D EXTREMITY Bilateral 10/17/2022   Procedure: BILATERAL THIGH AND LEG DEBRIDEMENT, PARTIAL BILATERAL FOOT AMPUTATION;  Surgeon: Harden Jerona GAILS, MD;  Location: [facility] OR;  Service: Orthopedics;  Laterality: Bilateral;   I & D EXTREMITY  Bilateral 11/05/2022   Procedure: BILATERAL THIGH DEBRIDEMENT;  Surgeon: Harden Jerona GAILS, MD;  Location: [facility] OR;  Service: Orthopedics;  Laterality: Bilateral;   I & D EXTREMITY Bilateral 11/07/2022   Procedure: BILATERAL THIGH AND LEG DEBRIDEMENT;  Surgeon: Harden Jerona GAILS, MD;  Location: [facility] OR;  Service: Orthopedics;  Laterality: Bilateral;   I & D EXTREMITY Bilateral 11/12/2022   Procedure: BILATERAL LEG DEBRIDEMENTS;  Surgeon: Harden Jerona GAILS, MD;  Location: [facility] OR;  Service: Orthopedics;  Laterality: Bilateral;   IRRIGATION AND DEBRIDEMENT FOOT Bilateral 10/05/2022   Procedure: IRRIGATION AND DEBRIDEMENT FOOT;  Surgeon: Janit Thresa HERO, DPM;  Location: [facility];  Service: Orthopedics/Podiatry;  Laterality: Bilateral;   WISDOM TOOTH EXTRACTION     Patient Active Problem List   Diagnosis Date Noted   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   Hypokalemia 11/27/2022   Effusion, left knee 11/05/2022   MDD (major depressive disorder), recurrent episode, moderate (HCC) 10/16/2022   Necrotizing fasciitis of pelvic region and thigh (HCC) 10/08/2022   Necrotizing fasciitis of lower leg (HCC) 10/08/2022   Necrotizing fasciitis (HCC) 10/08/2022   Sepsis due to cellulitis (HCC) 10/03/2022   Cellulitis of lower extremity 10/03/2022   Multinodular goiter 06/18/2022   Malnutrition of moderate degree 04/18/2022   Intractable vomiting with nausea 04/17/2022   DKA (diabetic ketoacidosis) (HCC) 03/04/2022   Nausea vomiting and diarrhea 12/25/2021   Diabetic gastroparesis (HCC)  10/09/2021   Drug-seeking behavior 09/21/2021   Essential hypertension 08/25/2021   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) 08/25/2021   GERD without esophagitis 08/25/2021   Hematemesis    Prolonged Q-T interval on ECG    Nausea and vomiting 07/20/2021   Anemia of chronic disease 06/06/2021   Dehydration 06/04/2021   Hypomagnesemia 04/21/2021   Ulcerated, foot, left, with fat layer exposed (HCC) 04/20/2021   Uncontrolled type 1 diabetes mellitus with hyperglycemia, with long-term current use of insulin  (HCC) 12/18/2020   DM type 1 (diabetes mellitus, type 1) (HCC) 12/18/2020   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 11/09/2020   Diabetic retinopathy of both eyes associated with type 1 diabetes mellitus (HCC) 09/24/2020   Diabetic gastroparesis associated with type 1 diabetes mellitus (HCC) 08/12/2020   Acute kidney injury superimposed on chronic kidney disease 07/25/2020   Generalized abdominal pain 07/23/2020   GERD (gastroesophageal reflux disease) 07/23/2020   Diabetic nephropathy associated with type 1 diabetes mellitus (HCC) 06/27/2020   Sepsis (HCC) 05/27/2020   HLD (hyperlipidemia) 05/23/2020   Sinus tachycardia 05/09/2020   Anemia 04/20/2020   Depression with anxiety 07/05/2019   Diabetic polyneuropathy associated with type 1 diabetes mellitus (HCC) 09/24/2017  Gastroparesis    Goiter 10/05/2009   DM2 (diabetes mellitus, type 2) (HCC) 10/05/2009   Hypertension associated with diabetes (HCC) 10/05/2009    ONSET DATE: 11/02/23 referral   REFERRING DIAG: [phone] (ICD-10-CM) - S/P AKA (above knee amputation) bilateral (HCC)   THERAPY DIAG:  Difficulty in walking, not elsewhere classified  Muscle weakness (generalized)  Other abnormalities of gait and mobility  Rationale for Evaluation and Treatment: Rehabilitation  SUBJECTIVE:                                                                                                                                                                                              SUBJECTIVE STATEMENT:  Pt arrived with bil prosthetics donned. She is wearing 5 ply in R socket and no ply in L socket. Didn't bring additional socks today.  Pt accompanied by: significant other  PERTINENT HISTORY: DM, diabetic gastroparesis, GERD, HTN, hyperthyroidism, normocytic anemia, B AKA  PAIN:  Are you having pain? No  PRECAUTIONS: Fall   PATIENT GOALS: to learn how to walk with new prosthetics                                                                                                                             TREATMENT  Pt transferred from chair to mat with scoot transfer with CGA Pt performed sit to stand transfer with mod A from elevated mat table at 27 high:  Pt was transferred back to wheelchair with 4 step with 2 cushion on top to bring to 27 height in her wheelchair.  Patient then started ambulation with bariatric walker. Pt required min A with sit to stand mostly but required mod to max A towards the end of the session due to fatigue.   Ambulated 45', 50', 50' with RW, min A during ambulation, and wheelchair follow for safety. Needed to adjust bil proshesis (R x 2, L x 1) due to internal rotation of prosthetic during ambulation. Patient was educated to externally rotate at him when performing turns during ambulation instead of pivoting to prevent internal rotation of prosthesis.  Performed 3 sit to stand transfers from wheelchair. Pt able to perform 2x with min A with above wheelchair set up, but required max A for last rep due to fatigue.  Pt transferred to mat table from chair at end of the session. Prosthesis were removed and step/cushioned removed from her chair and patient transferred to her chair with lateral scoot transfer with CGA.  Pt educated to trial 6 plys in R prosthesis and 1-3 plys in L prosthesis to prevent rotation. Pt to bring ply socks next  session.  PATIENT EDUCATION: Education details: Continue HEP. Person educated: Patient and SO Education method: Medical Illustrator Education comprehension: verbalized understanding and needs further education  HOME EXERCISE PROGRAM: -practice donning/doffing prosthetics as much as possible   GOALS: Goals reviewed with patient? Yes  SHORT TERM GOALS: Target date: 03/14/2024    Pt will be independent with initial HEP for improved functional mobility and independence with new prosthetics  Baseline: needs to be updated - pt progressing to safety w/ standing program at home (11/24) Goal status: IN PROGRESS  2.  Patient will complete sit <> stand with LRAD and no more than MinA consistently  Baseline: up to MaxA in // bars; maxA to bariatric walker (11/24); able to stand from 27 high surface with mod A Goal status: IN PROGRESS  3.  Patient will initiate ambulation with an ambulatory device with assist as needed Baseline: only able to ambulate in // bars; ambulates x12 ft w/ CGA and w/c follow (11/24) Goal status: MET  4.  Patient will be able to don/doff B prosthetics with no more than MinA  Baseline: MaxA; IND (11/24) Goal status: MET  LONG TERM GOALS: Target date: 04/25/2024    Pt will be independent with final HEP for improved functional mobility and independence with new prosthetics  Baseline: to be provided Goal status: INITIAL  2.  Patient will complete sit <> stand with LRAD with no more than supervision consistently  Baseline: MaxA in // bars ; able to stand up from 27 high surface with mod A with bariatric walker (02/15/24) Goal status: progressing continue  3.  Patient will ambulate >/=40ft with ambulatory device and no more than MinA Baseline: unable-only ambulating in // bars Goal status: REVISED  4.  Patient will traverse at least 1 community obstacle (curb vs ramp) with LRAD and no more than CGA Baseline: unable on eval ; unable 02/15/24 Goal  status: progressing  5.  Patient will don/doff B prosthetics independently  Baseline: MaxA, min A Goal status: progressing continue (02/15/24)   ASSESSMENT:  CLINICAL IMPRESSION: Today's session focused on continued progression of walking endurance and improving sit to stand transfers with bil prosthetics. Patient is currently able to ambulate about 50 feet with min A before needing to sit down,during ambulation, min to max A with sit to stand trasnfer and requires wheelchair follow.  OBJECTIVE IMPAIRMENTS: Abnormal gait, cardiopulmonary status limiting activity, decreased activity tolerance, decreased balance, decreased endurance, decreased knowledge of use of DME, decreased mobility, difficulty walking, decreased strength, impaired vision/preception, and prosthetic dependency .   ACTIVITY LIMITATIONS: carrying, lifting, bending, standing, squatting, stairs, locomotion level, and caring for others  PARTICIPATION LIMITATIONS: meal prep, cleaning, interpersonal relationship, driving, shopping, community activity, and occupation  PERSONAL FACTORS: Age, Behavior pattern, Fitness, Past/current experiences, Social background, Time since onset of injury/illness/exacerbation, Transportation, and 3+ comorbidities: see above are also affecting patient's functional outcome.   REHAB POTENTIAL: Fair complexity of comorbidities   CLINICAL DECISION MAKING: Evolving/moderate complexity  EVALUATION COMPLEXITY: Moderate  PLAN:  PT FREQUENCY: 2x/week  PT DURATION: 12 weeks  PLANNED INTERVENTIONS: 97164- PT Re-evaluation, 97750- Physical Performance Testing, 97110-Therapeutic exercises, 97530- Therapeutic activity, [id number]- Neuromuscular re-education, 97535- Self Care, [id number]- Manual therapy, [id number]- Gait training, [phone]- Orthotic Initial, [phone]- Prosthetic Initial , [phone]- Orthotic/Prosthetic subsequent, [phone]- Canalith repositioning, [phone]- Aquatic Therapy, [phone] (1-2 muscles), [id number] (3+ muscles)- Dry  Needling, Patient/Family education, Balance training, Stair training, Vestibular training, Visual/preceptual remediation/compensation, DME instructions, Wheelchair mobility training, Cryotherapy, and Moist heat  PLAN FOR NEXT SESSION: sink HEP practice, Continue to problem shoot L rotation from prosthetic or toeing in - normal toeing in? Or influence of some rotation from prosthetic? Need more socks?  Practice straight pathways for endurance and add in turns for increased challenge.  3 ply full length sock was added - further trouble shooting needed for decreasing IR of L prosthetic. Pt will also talk to hanger about this at her upcoming visit.   Plan for re-cert 2x/wk for 8 wks at 12/29 visit?  Raj LOISE Blanch, PT, DPT  02/23/2024, 8:09 AM        "

## 2024-02-25 ENCOUNTER — Ambulatory Visit

## 2024-02-25 DIAGNOSIS — R2689 Other abnormalities of gait and mobility: Secondary | ICD-10-CM

## 2024-02-25 DIAGNOSIS — M6281 Muscle weakness (generalized): Secondary | ICD-10-CM

## 2024-02-25 DIAGNOSIS — R262 Difficulty in walking, not elsewhere classified: Secondary | ICD-10-CM | POA: Diagnosis not present

## 2024-02-25 NOTE — Therapy (Unsigned)
 " OUTPATIENT PHYSICAL THERAPY NEURO TREATMENT   Patient Name: Kelli Hudson MRN: [id number] DOB:1990/03/09, 34 y.o., female Today's Date: 02/25/2024   PCP: Daphne Lesches, NP REFERRING PROVIDER: Jerona Sage, MD  END OF SESSION:  PT End of Session - 02/25/24 1421     Visit Number 21    Number of Visits 25    Date for Recertification  04/25/24    Authorization Type [employer] dual    Progress Note Due on Visit 10    PT Start Time 1100    PT Stop Time 1145    PT Time Calculation (min) 45 min    Equipment Utilized During Treatment Gait belt    Activity Tolerance Patient tolerated treatment well    Behavior During Therapy WFL for tasks assessed/performed               Past Medical History:  Diagnosis Date   Acute H. pylori gastric ulcer    Chronic kidney disease    Coffee ground emesis    Diabetes mellitus (HCC)    Diabetic gastroparesis (HCC)    DKA (diabetic ketoacidosis) (HCC) 02/24/2021   Gastroparesis    GERD (gastroesophageal reflux disease)    Hypertension    Hyperthyroidism    Intractable nausea and vomiting 04/20/2021   Normocytic anemia 04/20/2020   Prolonged Q-T interval on ECG    Past Surgical History:  Procedure Laterality Date   AMPUTATION Bilateral 10/24/2022   Procedure: BILATERAL BELOW KNEE AMPUTATION;  Surgeon: Sage Jerona GAILS, MD;  Location: [facility] OR;  Service: Orthopedics;  Laterality: Bilateral;   AMPUTATION Bilateral 10/08/2022   Procedure: BILATERAL LEG DEBRIDEMENT;  Surgeon: Sage Jerona GAILS, MD;  Location: [facility] OR;  Service: Orthopedics;  Laterality: Bilateral;   AMPUTATION Bilateral 11/14/2022   Procedure: BILATERAL ABOVE KNEE AMPUTATION;  Surgeon: Sage Jerona GAILS, MD;  Location: [facility] OR;  Service: Orthopedics;  Laterality: Bilateral;   AMPUTATION TOE Left 03/10/2018   Procedure: AMPUTATION FIFTH TOE;  Surgeon: Gershon Donnice SAUNDERS, DPM;  Location: [facility];  Service: Podiatry;  Laterality: Left;   APPLICATION OF WOUND VAC  Bilateral 10/12/2022   Procedure: APPLICATION OF WOUND VAC BILATERAL LEGS;  Surgeon: Celena Sharper, MD;  Location: [facility] OR;  Service: Orthopedics;  Laterality: Bilateral;   APPLICATION OF WOUND VAC Bilateral 10/24/2022   Procedure: APPLICATION OF WOUND VAC;  Surgeon: Sage Jerona GAILS, MD;  Location: [facility] OR;  Service: Orthopedics;  Laterality: Bilateral;   BIOPSY  01/28/2020   Procedure: BIOPSY;  Surgeon: Elicia Claw, MD;  Location: [facility];  Service: Gastroenterology;;   BOTOX  INJECTION  08/26/2021   Procedure: BOTOX  INJECTION;  Surgeon: Legrand Victory LITTIE DOUGLAS, MD;  Location: [facility];  Service: Gastroenterology;;   ESOPHAGOGASTRODUODENOSCOPY N/A 01/28/2020   Procedure: ESOPHAGOGASTRODUODENOSCOPY (EGD);  Surgeon: Elicia Claw, MD;  Location: [facility];  Service: Gastroenterology;  Laterality: N/A;   ESOPHAGOGASTRODUODENOSCOPY (EGD) WITH PROPOFOL  Left 09/08/2015   Procedure: ESOPHAGOGASTRODUODENOSCOPY (EGD) WITH PROPOFOL ;  Surgeon: Elsie Cree, MD;  Location: [facility];  Service: Endoscopy;  Laterality: Left;   ESOPHAGOGASTRODUODENOSCOPY (EGD) WITH PROPOFOL  N/A 08/26/2021   Procedure: ESOPHAGOGASTRODUODENOSCOPY (EGD) WITH PROPOFOL ;  Surgeon: Legrand Victory LITTIE DOUGLAS, MD;  Location: [facility];  Service: Gastroenterology;  Laterality: N/A;   GASTRIC STIMULATOR IMPLANT SURGERY  06/2022   at [facility]   I & D EXTREMITY Bilateral 10/12/2022   Procedure: IRRIGATION AND  DEBRIDEMENT  BILATERAL LEGS;  Surgeon: Celena Sharper, MD;  Location: [facility] OR;  Service: Orthopedics;  Laterality: Bilateral;   I &  D EXTREMITY Right 10/13/2022   Procedure: RIGHT THIGH WOUND VAC EXCHANGE;  Surgeon: Kendal Franky SQUIBB, MD;  Location: [facility] OR;  Service: Orthopedics;  Laterality: Right;   I & D EXTREMITY Bilateral 10/17/2022   Procedure: BILATERAL THIGH AND LEG DEBRIDEMENT, PARTIAL BILATERAL FOOT AMPUTATION;  Surgeon: Harden Jerona GAILS, MD;  Location: [facility] OR;  Service: Orthopedics;  Laterality: Bilateral;   I & D EXTREMITY  Bilateral 11/05/2022   Procedure: BILATERAL THIGH DEBRIDEMENT;  Surgeon: Harden Jerona GAILS, MD;  Location: [facility] OR;  Service: Orthopedics;  Laterality: Bilateral;   I & D EXTREMITY Bilateral 11/07/2022   Procedure: BILATERAL THIGH AND LEG DEBRIDEMENT;  Surgeon: Harden Jerona GAILS, MD;  Location: [facility] OR;  Service: Orthopedics;  Laterality: Bilateral;   I & D EXTREMITY Bilateral 11/12/2022   Procedure: BILATERAL LEG DEBRIDEMENTS;  Surgeon: Harden Jerona GAILS, MD;  Location: [facility] OR;  Service: Orthopedics;  Laterality: Bilateral;   IRRIGATION AND DEBRIDEMENT FOOT Bilateral 10/05/2022   Procedure: IRRIGATION AND DEBRIDEMENT FOOT;  Surgeon: Janit Thresa HERO, DPM;  Location: [facility];  Service: Orthopedics/Podiatry;  Laterality: Bilateral;   WISDOM TOOTH EXTRACTION     Patient Active Problem List   Diagnosis Date Noted   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   Hypokalemia 11/27/2022   Effusion, left knee 11/05/2022   MDD (major depressive disorder), recurrent episode, moderate (HCC) 10/16/2022   Necrotizing fasciitis of pelvic region and thigh (HCC) 10/08/2022   Necrotizing fasciitis of lower leg (HCC) 10/08/2022   Necrotizing fasciitis (HCC) 10/08/2022   Sepsis due to cellulitis (HCC) 10/03/2022   Cellulitis of lower extremity 10/03/2022   Multinodular goiter 06/18/2022   Malnutrition of moderate degree 04/18/2022   Intractable vomiting with nausea 04/17/2022   DKA (diabetic ketoacidosis) (HCC) 03/04/2022   Nausea vomiting and diarrhea 12/25/2021   Diabetic gastroparesis (HCC)  10/09/2021   Drug-seeking behavior 09/21/2021   Essential hypertension 08/25/2021   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) 08/25/2021   GERD without esophagitis 08/25/2021   Hematemesis    Prolonged Q-T interval on ECG    Nausea and vomiting 07/20/2021   Anemia of chronic disease 06/06/2021   Dehydration 06/04/2021   Hypomagnesemia 04/21/2021   Ulcerated, foot, left, with fat layer exposed (HCC) 04/20/2021   Uncontrolled type 1 diabetes mellitus with hyperglycemia, with long-term current use of insulin  (HCC) 12/18/2020   DM type 1 (diabetes mellitus, type 1) (HCC) 12/18/2020   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 11/09/2020   Diabetic retinopathy of both eyes associated with type 1 diabetes mellitus (HCC) 09/24/2020   Diabetic gastroparesis associated with type 1 diabetes mellitus (HCC) 08/12/2020   Acute kidney injury superimposed on chronic kidney disease 07/25/2020   Generalized abdominal pain 07/23/2020   GERD (gastroesophageal reflux disease) 07/23/2020   Diabetic nephropathy associated with type 1 diabetes mellitus (HCC) 06/27/2020   Sepsis (HCC) 05/27/2020   HLD (hyperlipidemia) 05/23/2020   Sinus tachycardia 05/09/2020   Anemia 04/20/2020   Depression with anxiety 07/05/2019   Diabetic polyneuropathy associated with type 1 diabetes mellitus (HCC) 09/24/2017  Gastroparesis    Goiter 10/05/2009   DM2 (diabetes mellitus, type 2) (HCC) 10/05/2009   Hypertension associated with diabetes (HCC) 10/05/2009    ONSET DATE: 11/02/23 referral   REFERRING DIAG: [phone] (ICD-10-CM) - S/P AKA (above knee amputation) bilateral (HCC)   THERAPY DIAG:  Difficulty in walking, not elsewhere classified  Muscle weakness (generalized)  Other abnormalities of gait and mobility  Rationale for Evaluation and Treatment: Rehabilitation  SUBJECTIVE:                                                                                                                                                                                              SUBJECTIVE STATEMENT:  Pt arrived with bil prosthetics donned. She is wearing 5 ply in R socket and no ply in L socket. Didn't bring additional socks today.  Pt accompanied by: significant other  PERTINENT HISTORY: DM, diabetic gastroparesis, GERD, HTN, hyperthyroidism, normocytic anemia, B AKA  PAIN:  Are you having pain? No  PRECAUTIONS: Fall   PATIENT GOALS: to learn how to walk with new prosthetics                                                                                                                             TREATMENT  Pt transferred from chair to mat with scoot transfer with CGA Pt performed sit to stand transfer with mod A from elevated mat table at 27 high:  Pt was transferred back to wheelchair with 4 step with 2 cushion on top to bring to 27 height in her wheelchair.  Patient then started ambulation with bariatric walker. Pt required min A with sit to stand mostly but required mod to max A towards the end of the session due to fatigue.   Ambulated 45', 50', 50' with RW, min A during ambulation, and wheelchair follow for safety. Needed to adjust bil proshesis (R x 2, L x 1) due to internal rotation of prosthetic during ambulation. Patient was educated to externally rotate at him when performing turns during ambulation instead of pivoting to prevent internal rotation of prosthesis.  Pt brought her requirement for to be on the kidney transplant list. Reviewed the pre qualification with patient. Patient educated that she would have to stand up using her hands if she is using current prosthesis. She will have to ambulate with walking device for safety with current prosthetics. If she uses stubbies and practice them home, she may be able to ambulate without any AD.   PATIENT EDUCATION: Education details: Continue HEP. Person educated: Patient and SO Education method: [employer] Education comprehension: verbalized understanding and needs further education  HOME EXERCISE PROGRAM: -practice donning/doffing prosthetics as much as possible   GOALS: Goals reviewed with patient? Yes  SHORT TERM GOALS: Target date: 03/14/2024    Pt will be independent with initial HEP for improved functional mobility and independence with new prosthetics  Baseline: needs to be updated - pt progressing to safety w/ standing program at home (11/24) Goal status: IN PROGRESS  2.  Patient will complete sit <> stand with LRAD and no more than MinA consistently  Baseline: up to MaxA in // bars; maxA to bariatric walker (11/24); able to stand from 27 high surface with mod A Goal status: IN PROGRESS  3.  Patient will initiate ambulation with an ambulatory device with assist as needed Baseline: only able to ambulate in // bars; ambulates x12 ft w/ CGA and w/c follow (11/24) Goal status: MET  4.  Patient will be able to don/doff B prosthetics with no more than MinA  Baseline: MaxA; IND (11/24) Goal status: MET  LONG TERM GOALS: Target date: 04/25/2024    Pt will be independent with final HEP for improved functional mobility and independence with new prosthetics  Baseline: to be provided Goal status: INITIAL  2.  Patient will complete sit <> stand with LRAD with no more than supervision consistently  Baseline: MaxA in // bars ; able to stand up from 27 high surface with mod A with bariatric walker (02/15/24) Goal status: progressing continue  3.  Patient will ambulate >/=69ft with ambulatory device and no more than MinA Baseline: unable-only ambulating in // bars Goal status: REVISED  4.  Patient will traverse at least 1 community obstacle (curb vs ramp) with LRAD and no more than CGA Baseline: unable on eval ; unable 02/15/24 Goal status: progressing  5.  Patient will don/doff B prosthetics independently  Baseline: MaxA, min A Goal status: progressing  continue (02/15/24)   ASSESSMENT:  CLINICAL IMPRESSION: Tolerated session well and demo mild improvement in her walkng endurance. Patient has more difficulty with turns as she expands more energy and demo fatigues. Will trial straight path walking next session to see if she is able to ambulate further. .  OBJECTIVE IMPAIRMENTS: Abnormal gait, cardiopulmonary status limiting activity, decreased activity tolerance, decreased balance, decreased endurance, decreased knowledge of use of DME, decreased mobility, difficulty walking, decreased strength, impaired vision/preception, and prosthetic dependency .   ACTIVITY LIMITATIONS: carrying, lifting, bending, standing, squatting, stairs, locomotion level, and caring for others  PARTICIPATION LIMITATIONS: meal prep, cleaning, interpersonal relationship, driving, shopping, community activity, and occupation  PERSONAL FACTORS: Age, Behavior pattern, Fitness, Past/current experiences, Social background, Time since onset of injury/illness/exacerbation, Transportation, and 3+ comorbidities: see above are also affecting patient's functional outcome.   REHAB POTENTIAL: Fair complexity of comorbidities   CLINICAL DECISION MAKING: Evolving/moderate complexity  EVALUATION COMPLEXITY: Moderate  PLAN:  PT FREQUENCY: 2x/week  PT DURATION: 12 weeks  PLANNED INTERVENTIONS: [id number]- PT Re-evaluation, 97750- Physical Performance Testing, 97110-Therapeutic exercises, 97530- Therapeutic activity, 97112-  Neuromuscular re-education, [id number]- Self Care, [id number]- Manual therapy, [id number]- Gait training, [id number]- Orthotic Initial, [id number]- Prosthetic Initial , [id number]- Orthotic/Prosthetic subsequent, [phone]- Canalith repositioning, [id number]- Aquatic Therapy, [id number] (1-2 muscles), [id number] (3+ muscles)- Dry Needling, Patient/Family education, Balance training, Stair training, Vestibular training, Visual/preceptual remediation/compensation, DME instructions, Wheelchair mobility training,  Cryotherapy, and Moist heat  PLAN FOR NEXT SESSION: sink HEP practice, Continue to problem shoot L rotation from prosthetic or toeing in - normal toeing in? Or influence of some rotation from prosthetic? Need more socks?  Practice straight pathways for endurance and add in turns for increased challenge.  3 ply full length sock was added - further trouble shooting needed for decreasing IR of L prosthetic. Pt will also talk to hanger about this at her upcoming visit.   Plan for re-cert 2x/wk for 8 wks at 12/29 visit?  Raj LOISE Blanch, PT, DPT  02/25/2024, 2:22 PM        "

## 2024-02-26 ENCOUNTER — Encounter (HOSPITAL_COMMUNITY)
Admission: RE | Admit: 2024-02-26 | Discharge: 2024-02-26 | Disposition: A | Discharge status: Home / Self Care | Source: Ambulatory Visit | Attending: Nephrology | Admitting: Nephrology

## 2024-02-26 VITALS — BP 142/101 | HR 88 | Temp 97.2°F | Resp 17

## 2024-02-26 DIAGNOSIS — D631 Anemia in chronic kidney disease: Secondary | ICD-10-CM | POA: Insufficient documentation

## 2024-02-26 DIAGNOSIS — N184 Chronic kidney disease, stage 4 (severe): Secondary | ICD-10-CM | POA: Diagnosis present

## 2024-02-26 LAB — POCT HEMOGLOBIN-HEMACUE: Hemoglobin: 11.1 g/dL — ABNORMAL LOW (ref 12.0–15.0)

## 2024-02-26 MED ORDER — EPOETIN ALFA-EPBX 10000 UNIT/ML IJ SOLN
10000.0000 [IU] | Freq: Once | INTRAMUSCULAR | Status: AC
  Administered 2024-02-26: 10000 [IU] via SUBCUTANEOUS

## 2024-02-26 MED ORDER — EPOETIN ALFA-EPBX 10000 UNIT/ML IJ SOLN
INTRAMUSCULAR | Status: AC
  Filled 2024-02-26: qty 1

## 2024-02-29 ENCOUNTER — Encounter: Payer: Self-pay | Admitting: Physical Therapy

## 2024-02-29 ENCOUNTER — Ambulatory Visit: Admitting: Physical Therapy

## 2024-02-29 DIAGNOSIS — R29898 Other symptoms and signs involving the musculoskeletal system: Secondary | ICD-10-CM

## 2024-02-29 DIAGNOSIS — R262 Difficulty in walking, not elsewhere classified: Secondary | ICD-10-CM | POA: Diagnosis not present

## 2024-02-29 DIAGNOSIS — M6281 Muscle weakness (generalized): Secondary | ICD-10-CM

## 2024-02-29 DIAGNOSIS — R2689 Other abnormalities of gait and mobility: Secondary | ICD-10-CM

## 2024-02-29 DIAGNOSIS — R293 Abnormal posture: Secondary | ICD-10-CM

## 2024-02-29 NOTE — Therapy (Signed)
 " OUTPATIENT PHYSICAL THERAPY NEURO TREATMENT   Patient Name: Kelli Hudson MRN: [id number] DOB:[birth year], 34 y.o., female Today's Date: 02/29/2024   PCP: Daphne Lesches, NP REFERRING PROVIDER: Jerona Sage, MD  END OF SESSION:  PT End of Session - 02/29/24 1150     Visit Number 22    Number of Visits 25    Date for Recertification  04/25/24    Authorization Type [employer] dual    Progress Note Due on Visit 10    PT Start Time 1146    PT Stop Time 1235    PT Time Calculation (min) 49 min    Equipment Utilized During Treatment Gait belt    Activity Tolerance Patient tolerated treatment well    Behavior During Therapy WFL for tasks assessed/performed               Past Medical History:  Diagnosis Date   Acute H. pylori gastric ulcer    Chronic kidney disease    Coffee ground emesis    Diabetes mellitus (HCC)    Diabetic gastroparesis (HCC)    DKA (diabetic ketoacidosis) (HCC) 02/24/2021   Gastroparesis    GERD (gastroesophageal reflux disease)    Hypertension    Hyperthyroidism    Intractable nausea and vomiting 04/20/2021   Normocytic anemia 04/20/2020   Prolonged Q-T interval on ECG    Past Surgical History:  Procedure Laterality Date   AMPUTATION Bilateral 10/24/2022   Procedure: BILATERAL BELOW KNEE AMPUTATION;  Surgeon: Sage Jerona GAILS, MD;  Location: [facility] OR;  Service: Orthopedics;  Laterality: Bilateral;   AMPUTATION Bilateral 10/08/2022   Procedure: BILATERAL LEG DEBRIDEMENT;  Surgeon: Sage Jerona GAILS, MD;  Location: [facility] OR;  Service: Orthopedics;  Laterality: Bilateral;   AMPUTATION Bilateral 11/14/2022   Procedure: BILATERAL ABOVE KNEE AMPUTATION;  Surgeon: Sage Jerona GAILS, MD;  Location: [facility] OR;  Service: Orthopedics;  Laterality: Bilateral;   AMPUTATION TOE Left 03/10/2018   Procedure: AMPUTATION FIFTH TOE;  Surgeon: Gershon Donnice SAUNDERS, DPM;  Location: [facility];  Service: Podiatry;  Laterality: Left;   APPLICATION OF WOUND VAC  Bilateral 10/12/2022   Procedure: APPLICATION OF WOUND VAC BILATERAL LEGS;  Surgeon: Celena Sharper, MD;  Location: [facility] OR;  Service: Orthopedics;  Laterality: Bilateral;   APPLICATION OF WOUND VAC Bilateral 10/24/2022   Procedure: APPLICATION OF WOUND VAC;  Surgeon: Sage Jerona GAILS, MD;  Location: [facility] OR;  Service: Orthopedics;  Laterality: Bilateral;   BIOPSY  01/28/2020   Procedure: BIOPSY;  Surgeon: Elicia Claw, MD;  Location: [facility];  Service: Gastroenterology;;   BOTOX  INJECTION  08/26/2021   Procedure: BOTOX  INJECTION;  Surgeon: Legrand Victory LITTIE DOUGLAS, MD;  Location: [facility];  Service: Gastroenterology;;   ESOPHAGOGASTRODUODENOSCOPY N/A 01/28/2020   Procedure: ESOPHAGOGASTRODUODENOSCOPY (EGD);  Surgeon: Elicia Claw, MD;  Location: [facility];  Service: Gastroenterology;  Laterality: N/A;   ESOPHAGOGASTRODUODENOSCOPY (EGD) WITH PROPOFOL  Left 09/08/2015   Procedure: ESOPHAGOGASTRODUODENOSCOPY (EGD) WITH PROPOFOL ;  Surgeon: Elsie Cree, MD;  Location: [facility];  Service: Endoscopy;  Laterality: Left;   ESOPHAGOGASTRODUODENOSCOPY (EGD) WITH PROPOFOL  N/A 08/26/2021   Procedure: ESOPHAGOGASTRODUODENOSCOPY (EGD) WITH PROPOFOL ;  Surgeon: Legrand Victory LITTIE DOUGLAS, MD;  Location: [facility];  Service: Gastroenterology;  Laterality: N/A;   GASTRIC STIMULATOR IMPLANT SURGERY  06/2022   at [facility]   I & D EXTREMITY Bilateral 10/12/2022   Procedure: IRRIGATION AND  DEBRIDEMENT  BILATERAL LEGS;  Surgeon: Celena Sharper, MD;  Location: [facility] OR;  Service: Orthopedics;  Laterality: Bilateral;   I &  D EXTREMITY Right 10/13/2022   Procedure: RIGHT THIGH WOUND VAC EXCHANGE;  Surgeon: Kendal Franky SQUIBB, MD;  Location: [facility] OR;  Service: Orthopedics;  Laterality: Right;   I & D EXTREMITY Bilateral 10/17/2022   Procedure: BILATERAL THIGH AND LEG DEBRIDEMENT, PARTIAL BILATERAL FOOT AMPUTATION;  Surgeon: Harden Jerona GAILS, MD;  Location: [facility] OR;  Service: Orthopedics;  Laterality: Bilateral;   I & D EXTREMITY  Bilateral 11/05/2022   Procedure: BILATERAL THIGH DEBRIDEMENT;  Surgeon: Harden Jerona GAILS, MD;  Location: [facility] OR;  Service: Orthopedics;  Laterality: Bilateral;   I & D EXTREMITY Bilateral 11/07/2022   Procedure: BILATERAL THIGH AND LEG DEBRIDEMENT;  Surgeon: Harden Jerona GAILS, MD;  Location: [facility] OR;  Service: Orthopedics;  Laterality: Bilateral;   I & D EXTREMITY Bilateral 11/12/2022   Procedure: BILATERAL LEG DEBRIDEMENTS;  Surgeon: Harden Jerona GAILS, MD;  Location: [facility] OR;  Service: Orthopedics;  Laterality: Bilateral;   IRRIGATION AND DEBRIDEMENT FOOT Bilateral 10/05/2022   Procedure: IRRIGATION AND DEBRIDEMENT FOOT;  Surgeon: Janit Thresa HERO, DPM;  Location: [facility];  Service: Orthopedics/Podiatry;  Laterality: Bilateral;   WISDOM TOOTH EXTRACTION     Patient Active Problem List   Diagnosis Date Noted   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   Hypokalemia 11/27/2022   Effusion, left knee 11/05/2022   MDD (major depressive disorder), recurrent episode, moderate (HCC) 10/16/2022   Necrotizing fasciitis of pelvic region and thigh (HCC) 10/08/2022   Necrotizing fasciitis of lower leg (HCC) 10/08/2022   Necrotizing fasciitis (HCC) 10/08/2022   Sepsis due to cellulitis (HCC) 10/03/2022   Cellulitis of lower extremity 10/03/2022   Multinodular goiter 06/18/2022   Malnutrition of moderate degree 04/18/2022   Intractable vomiting with nausea 04/17/2022   DKA (diabetic ketoacidosis) (HCC) 03/04/2022   Nausea vomiting and diarrhea 12/25/2021   Diabetic gastroparesis (HCC)  10/09/2021   Drug-seeking behavior 09/21/2021   Essential hypertension 08/25/2021   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) 08/25/2021   GERD without esophagitis 08/25/2021   Hematemesis    Prolonged Q-T interval on ECG    Nausea and vomiting 07/20/2021   Anemia of chronic disease 06/06/2021   Dehydration 06/04/2021   Hypomagnesemia 04/21/2021   Ulcerated, foot, left, with fat layer exposed (HCC) 04/20/2021   Uncontrolled type 1 diabetes mellitus with hyperglycemia, with long-term current use of insulin  (HCC) 12/18/2020   DM type 1 (diabetes mellitus, type 1) (HCC) 12/18/2020   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 11/09/2020   Diabetic retinopathy of both eyes associated with type 1 diabetes mellitus (HCC) 09/24/2020   Diabetic gastroparesis associated with type 1 diabetes mellitus (HCC) 08/12/2020   Acute kidney injury superimposed on chronic kidney disease 07/25/2020   Generalized abdominal pain 07/23/2020   GERD (gastroesophageal reflux disease) 07/23/2020   Diabetic nephropathy associated with type 1 diabetes mellitus (HCC) 06/27/2020   Sepsis (HCC) 05/27/2020   HLD (hyperlipidemia) 05/23/2020   Sinus tachycardia 05/09/2020   Anemia 04/20/2020   Depression with anxiety 07/05/2019   Diabetic polyneuropathy associated with type 1 diabetes mellitus (HCC) 09/24/2017  Gastroparesis    Goiter 10/05/2009   DM2 (diabetes mellitus, type 2) (HCC) 10/05/2009   Hypertension associated with diabetes (HCC) 10/05/2009    ONSET DATE: 11/02/23 referral   REFERRING DIAG: [phone] (ICD-10-CM) - S/P AKA (above knee amputation) bilateral (HCC)   THERAPY DIAG:  Difficulty in walking, not elsewhere classified  Muscle weakness (generalized)  Other abnormalities of gait and mobility  Abnormal posture  Other symptoms and signs involving the musculoskeletal system  Rationale for Evaluation and Treatment: Rehabilitation  SUBJECTIVE:                                                                                                                                                                                              SUBJECTIVE STATEMENT:  Pt arrived with bil prosthetics donned. She is wearing no ply in L socket and 6 ply in R socket. Did bring 3-ply for LLE.  Pt accompanied by: significant other  PERTINENT HISTORY: DM, diabetic gastroparesis, GERD, HTN, hyperthyroidism, normocytic anemia, B AKA  PAIN:  Are you having pain? No  PRECAUTIONS: Fall   PATIENT GOALS: to learn how to walk with new prosthetics                                                                                                                             TREATMENT  -Pt attempts pull into push to stand at low single // bar w/ inability to clear bottom maxA -Pt pull-to-stand modA from wheelchair seat w/ elevated // bar > alt UE lifts to fatigue > repeated attempt to push to stand alternating dominant UE w/ pt having increased hip instability and unable to achieve upright > STS modA w/ PT and caregiver adjusting wheelchair position and Bil prosthetics to decrease knee flexion and repeated UE reach laterally, overhead, and alt forward reach w/ minimal trunk rotation to fatigue -Pull-to-stand modA w/ bil // bars progressing to palm support only minA-CGA for x3 minutes -Amb x8 ft forward > x4 ft backwards minA to block bil knee w/ inc forward trunk flexion due to fear of falling  PATIENT  EDUCATION: Education details: Continue HEP.  Discussed ongoing POC and to work on positioning and stretching anterior chain.  Encouraged continuing regular HEP for glute strengthening and will review techniques next session.  Mechanics of STS.   Person educated: Patient and SO Education method: Medical Illustrator Education comprehension: verbalized understanding and needs further education  HOME EXERCISE PROGRAM: -practice donning/doffing prosthetics as much as possible    GOALS: Goals reviewed with patient? Yes  SHORT TERM GOALS: Target date: 03/14/2024    Pt will be independent with initial HEP for improved functional mobility and independence with new prosthetics  Baseline: needs to be updated - pt progressing to safety w/ standing program at home (11/24) Goal status: IN PROGRESS  2.  Patient will complete sit <> stand with LRAD and no more than MinA consistently  Baseline: up to MaxA in // bars; maxA to bariatric walker (11/24); able to stand from 27 high surface with mod A Goal status: IN PROGRESS  3.  Patient will initiate ambulation with an ambulatory device with assist as needed Baseline: only able to ambulate in // bars; ambulates x12 ft w/ CGA and w/c follow (11/24) Goal status: MET  4.  Patient will be able to don/doff B prosthetics with no more than MinA  Baseline: MaxA; IND (11/24) Goal status: MET  LONG TERM GOALS: Target date: 04/25/2024    Pt will be independent with final HEP for improved functional mobility and independence with new prosthetics  Baseline: to be provided Goal status: INITIAL  2.  Patient will complete sit <> stand with LRAD with no more than supervision consistently  Baseline: MaxA in // bars ; able to stand up from 27 high surface with mod A with bariatric walker (02/15/24) Goal status: progressing continue  3.  Patient will ambulate >/=68ft with ambulatory device and no more than MinA Baseline: unable-only ambulating in // bars Goal status: REVISED  4.  Patient will traverse at least 1 community obstacle (curb vs ramp) with LRAD and no more than CGA Baseline: unable on eval ; unable 02/15/24 Goal status: progressing  5.  Patient will don/doff B prosthetics independently  Baseline: MaxA, min A Goal status: progressing continue (02/15/24)   ASSESSMENT:  CLINICAL IMPRESSION: Ongoing work to improve standing mechanics using various techniques to challenge glute engagement as rise portion is more  difficult than anterior translation of STS.  She continues to be contracted in standing in anterior chain.  She would benefit from review of glute strengthening and anterior chain stretching.  This PT feels that working into a modified tall kneel position could also be beneficial for proximal stability due to prior stubby prosthetics.  Will continue per POC.  OBJECTIVE IMPAIRMENTS: Abnormal gait, cardiopulmonary status limiting activity, decreased activity tolerance, decreased balance, decreased endurance, decreased knowledge of use of DME, decreased mobility, difficulty walking, decreased strength, impaired vision/preception, and prosthetic dependency .   ACTIVITY LIMITATIONS: carrying, lifting, bending, standing, squatting, stairs, locomotion level, and caring for others  PARTICIPATION LIMITATIONS: meal prep, cleaning, interpersonal relationship, driving, shopping, community activity, and occupation  PERSONAL FACTORS: Age, Behavior pattern, Fitness, Past/current experiences, Social background, Time since onset of injury/illness/exacerbation, Transportation, and 3+ comorbidities: see above are also affecting patient's functional outcome.   REHAB POTENTIAL: Fair complexity of comorbidities   CLINICAL DECISION MAKING: Evolving/moderate complexity  EVALUATION COMPLEXITY: Moderate  PLAN:  PT FREQUENCY: 2x/week  PT DURATION: 12 weeks  PLANNED INTERVENTIONS: [id number]- PT Re-evaluation, 97750- Physical Performance Testing, 97110-Therapeutic exercises, 97530- Therapeutic activity, [id number]- Neuromuscular  re-education, [phone]- Self Care, [id number]- Manual therapy, [id number]- Gait training, [phone]- Orthotic Initial, [id number]- Prosthetic Initial , [id number]- Orthotic/Prosthetic subsequent, [phone]- Canalith repositioning, [id number]- Aquatic Therapy, [phone] (1-2 muscles), [id number] (3+ muscles)- Dry Needling, Patient/Family education, Balance training, Stair training, Vestibular training, Visual/preceptual remediation/compensation, DME  instructions, Wheelchair mobility training, Cryotherapy, and Moist heat  PLAN FOR NEXT SESSION: sink HEP practice, Continue to problem shoot L rotation from prosthetic or toeing in - normal toeing in? Or influence of some rotation from prosthetic? Need more socks?  Practice straight pathways for endurance and add in turns for increased challenge.  3 ply full length sock was added - further trouble shooting needed for decreasing IR of L prosthetic. Pt will also talk to hanger about this at her upcoming visit.   Retro-stepping in // bars, glute strength on mat - roll into prone > quadruped > tall kneel with prosthetics??, stretch hip flexor in side-lying/prone  Daved KATHEE Bull, PT, DPT  02/29/2024, 12:47 PM        "

## 2024-03-03 ENCOUNTER — Ambulatory Visit

## 2024-03-03 ENCOUNTER — Other Ambulatory Visit (HOSPITAL_COMMUNITY): Payer: Self-pay | Admitting: Nephrology

## 2024-03-03 NOTE — Progress Notes (Signed)
 Received updated Retacrit  order  Dose: 10000 units every 3 weeks  Labs: POCT Hgb at every visit; iron  panel and ferritin monthly  Next dose would be due on 03/18/2024  Sherry Pennant, PharmD, MPH, BCPS, CPP Clinical Pharmacist

## 2024-03-04 ENCOUNTER — Encounter (HOSPITAL_COMMUNITY)

## 2024-03-07 ENCOUNTER — Ambulatory Visit

## 2024-03-08 ENCOUNTER — Ambulatory Visit

## 2024-03-10 ENCOUNTER — Ambulatory Visit

## 2024-03-10 ENCOUNTER — Encounter: Payer: Self-pay | Admitting: Physical Therapy

## 2024-03-10 ENCOUNTER — Ambulatory Visit: Admitting: Physical Therapy

## 2024-03-10 DIAGNOSIS — R262 Difficulty in walking, not elsewhere classified: Secondary | ICD-10-CM

## 2024-03-10 DIAGNOSIS — R29898 Other symptoms and signs involving the musculoskeletal system: Secondary | ICD-10-CM

## 2024-03-10 DIAGNOSIS — R2689 Other abnormalities of gait and mobility: Secondary | ICD-10-CM

## 2024-03-10 DIAGNOSIS — M6281 Muscle weakness (generalized): Secondary | ICD-10-CM

## 2024-03-10 DIAGNOSIS — R293 Abnormal posture: Secondary | ICD-10-CM

## 2024-03-10 NOTE — Therapy (Signed)
 " OUTPATIENT PHYSICAL THERAPY NEURO TREATMENT   Patient Name: Kelli Hudson MRN: [id number] DOB:[birth year], 34 y.o., female Today's Date: 03/10/2024   PCP: Daphne Lesches, NP REFERRING PROVIDER: Jerona Sage, MD  END OF SESSION:  PT End of Session - 03/10/24 1630     Visit Number 23    Number of Visits 25    Date for Recertification  04/25/24    Authorization Type [employer] dual    Progress Note Due on Visit 10    PT Start Time 1624    PT Stop Time 1710    PT Time Calculation (min) 46 min    Equipment Utilized During Treatment Gait belt    Activity Tolerance Patient tolerated treatment well    Behavior During Therapy WFL for tasks assessed/performed               Past Medical History:  Diagnosis Date   Acute H. pylori gastric ulcer    Chronic kidney disease    Coffee ground emesis    Diabetes mellitus (HCC)    Diabetic gastroparesis (HCC)    DKA (diabetic ketoacidosis) (HCC) 02/24/2021   Gastroparesis    GERD (gastroesophageal reflux disease)    Hypertension    Hyperthyroidism    Intractable nausea and vomiting 04/20/2021   Normocytic anemia 04/20/2020   Prolonged Q-T interval on ECG    Past Surgical History:  Procedure Laterality Date   AMPUTATION Bilateral 10/24/2022   Procedure: BILATERAL BELOW KNEE AMPUTATION;  Surgeon: Sage Jerona GAILS, MD;  Location: [facility] OR;  Service: Orthopedics;  Laterality: Bilateral;   AMPUTATION Bilateral 10/08/2022   Procedure: BILATERAL LEG DEBRIDEMENT;  Surgeon: Sage Jerona GAILS, MD;  Location: [facility] OR;  Service: Orthopedics;  Laterality: Bilateral;   AMPUTATION Bilateral 11/14/2022   Procedure: BILATERAL ABOVE KNEE AMPUTATION;  Surgeon: Sage Jerona GAILS, MD;  Location: [facility] OR;  Service: Orthopedics;  Laterality: Bilateral;   AMPUTATION TOE Left 03/10/2018   Procedure: AMPUTATION FIFTH TOE;  Surgeon: Gershon Donnice SAUNDERS, DPM;  Location: [facility];  Service: Podiatry;  Laterality: Left;   APPLICATION OF WOUND VAC  Bilateral 10/12/2022   Procedure: APPLICATION OF WOUND VAC BILATERAL LEGS;  Surgeon: Celena Sharper, MD;  Location: [facility] OR;  Service: Orthopedics;  Laterality: Bilateral;   APPLICATION OF WOUND VAC Bilateral 10/24/2022   Procedure: APPLICATION OF WOUND VAC;  Surgeon: Sage Jerona GAILS, MD;  Location: [facility] OR;  Service: Orthopedics;  Laterality: Bilateral;   BIOPSY  01/28/2020   Procedure: BIOPSY;  Surgeon: Elicia Claw, MD;  Location: [facility];  Service: Gastroenterology;;   BOTOX  INJECTION  08/26/2021   Procedure: BOTOX  INJECTION;  Surgeon: Legrand Victory LITTIE DOUGLAS, MD;  Location: [facility];  Service: Gastroenterology;;   ESOPHAGOGASTRODUODENOSCOPY N/A 01/28/2020   Procedure: ESOPHAGOGASTRODUODENOSCOPY (EGD);  Surgeon: Elicia Claw, MD;  Location: [facility];  Service: Gastroenterology;  Laterality: N/A;   ESOPHAGOGASTRODUODENOSCOPY (EGD) WITH PROPOFOL  Left 09/08/2015   Procedure: ESOPHAGOGASTRODUODENOSCOPY (EGD) WITH PROPOFOL ;  Surgeon: Elsie Cree, MD;  Location: [facility];  Service: Endoscopy;  Laterality: Left;   ESOPHAGOGASTRODUODENOSCOPY (EGD) WITH PROPOFOL  N/A 08/26/2021   Procedure: ESOPHAGOGASTRODUODENOSCOPY (EGD) WITH PROPOFOL ;  Surgeon: Legrand Victory LITTIE DOUGLAS, MD;  Location: [facility];  Service: Gastroenterology;  Laterality: N/A;   GASTRIC STIMULATOR IMPLANT SURGERY  06/2022   at [facility]   I & D EXTREMITY Bilateral 10/12/2022   Procedure: IRRIGATION AND  DEBRIDEMENT  BILATERAL LEGS;  Surgeon: Celena Sharper, MD;  Location: [facility] OR;  Service: Orthopedics;  Laterality: Bilateral;   I &  D EXTREMITY Right 10/13/2022   Procedure: RIGHT THIGH WOUND VAC EXCHANGE;  Surgeon: Kendal Franky SQUIBB, MD;  Location: [facility] OR;  Service: Orthopedics;  Laterality: Right;   I & D EXTREMITY Bilateral 10/17/2022   Procedure: BILATERAL THIGH AND LEG DEBRIDEMENT, PARTIAL BILATERAL FOOT AMPUTATION;  Surgeon: Harden Jerona GAILS, MD;  Location: [facility] OR;  Service: Orthopedics;  Laterality: Bilateral;   I & D EXTREMITY  Bilateral 11/05/2022   Procedure: BILATERAL THIGH DEBRIDEMENT;  Surgeon: Harden Jerona GAILS, MD;  Location: [facility] OR;  Service: Orthopedics;  Laterality: Bilateral;   I & D EXTREMITY Bilateral 11/07/2022   Procedure: BILATERAL THIGH AND LEG DEBRIDEMENT;  Surgeon: Harden Jerona GAILS, MD;  Location: [facility] OR;  Service: Orthopedics;  Laterality: Bilateral;   I & D EXTREMITY Bilateral 11/12/2022   Procedure: BILATERAL LEG DEBRIDEMENTS;  Surgeon: Harden Jerona GAILS, MD;  Location: [facility] OR;  Service: Orthopedics;  Laterality: Bilateral;   IRRIGATION AND DEBRIDEMENT FOOT Bilateral 10/05/2022   Procedure: IRRIGATION AND DEBRIDEMENT FOOT;  Surgeon: Janit Thresa HERO, DPM;  Location: [facility];  Service: Orthopedics/Podiatry;  Laterality: Bilateral;   WISDOM TOOTH EXTRACTION     Patient Active Problem List   Diagnosis Date Noted   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   Hypokalemia 11/27/2022   Effusion, left knee 11/05/2022   MDD (major depressive disorder), recurrent episode, moderate (HCC) 10/16/2022   Necrotizing fasciitis of pelvic region and thigh (HCC) 10/08/2022   Necrotizing fasciitis of lower leg (HCC) 10/08/2022   Necrotizing fasciitis (HCC) 10/08/2022   Sepsis due to cellulitis (HCC) 10/03/2022   Cellulitis of lower extremity 10/03/2022   Multinodular goiter 06/18/2022   Malnutrition of moderate degree 04/18/2022   Intractable vomiting with nausea 04/17/2022   DKA (diabetic ketoacidosis) (HCC) 03/04/2022   Nausea vomiting and diarrhea 12/25/2021   Diabetic gastroparesis (HCC)  10/09/2021   Drug-seeking behavior 09/21/2021   Essential hypertension 08/25/2021   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) 08/25/2021   GERD without esophagitis 08/25/2021   Hematemesis    Prolonged Q-T interval on ECG    Nausea and vomiting 07/20/2021   Anemia of chronic disease 06/06/2021   Dehydration 06/04/2021   Hypomagnesemia 04/21/2021   Ulcerated, foot, left, with fat layer exposed (HCC) 04/20/2021   Uncontrolled type 1 diabetes mellitus with hyperglycemia, with long-term current use of insulin  (HCC) 12/18/2020   DM type 1 (diabetes mellitus, type 1) (HCC) 12/18/2020   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 11/09/2020   Diabetic retinopathy of both eyes associated with type 1 diabetes mellitus (HCC) 09/24/2020   Diabetic gastroparesis associated with type 1 diabetes mellitus (HCC) 08/12/2020   Acute kidney injury superimposed on chronic kidney disease 07/25/2020   Generalized abdominal pain 07/23/2020   GERD (gastroesophageal reflux disease) 07/23/2020   Diabetic nephropathy associated with type 1 diabetes mellitus (HCC) 06/27/2020   Sepsis (HCC) 05/27/2020   HLD (hyperlipidemia) 05/23/2020   Sinus tachycardia 05/09/2020   Anemia 04/20/2020   Depression with anxiety 07/05/2019   Diabetic polyneuropathy associated with type 1 diabetes mellitus (HCC) 09/24/2017  Gastroparesis    Goiter 10/05/2009   DM2 (diabetes mellitus, type 2) (HCC) 10/05/2009   Hypertension associated with diabetes (HCC) 10/05/2009    ONSET DATE: 11/02/23 referral   REFERRING DIAG: [id number] (ICD-10-CM) - S/P AKA (above knee amputation) bilateral (HCC)   THERAPY DIAG:  Difficulty in walking, not elsewhere classified  Muscle weakness (generalized)  Other abnormalities of gait and mobility  Abnormal posture  Other symptoms and signs involving the musculoskeletal system  Rationale for Evaluation and Treatment: Rehabilitation  SUBJECTIVE:                                                                                                                                                                                              SUBJECTIVE STATEMENT:  Pt arrived with bil prosthetics donned. She is wearing no ply in L socket and 6 ply in R socket.   Pt accompanied by: significant other  PERTINENT HISTORY: DM, diabetic gastroparesis, GERD, HTN, hyperthyroidism, normocytic anemia, B AKA  PAIN:  Are you having pain? No  PRECAUTIONS: Fall   PATIENT GOALS: to learn how to walk with new prosthetics                                                                                                                             TREATMENT  -Pt transfers w/c<>mat at beginning and end of session SBA performing lateral bump right then left > transferred to supine w/ minA for LE/prosthetic management -In side-lying PT performs hip flexor stretch 3x60 sec each side, used pillow between knee for improved prosthetic positioning. -In prone pt propped on pillow for gentle hip flexor stretch x2 minutes > flat w/ chest supported x2 minutes > completely flat w/ pt reporting most stretch in this position x2 minutes; edu on need to do this daily at least 2 minutes and potentially most beneficial before bed after sitting for prolonged periods -From prone PT supported BLE into quadruped press up x3 rounds:  forward and backward rocking > lateral rocking > attempts at pelvic hike/rotation requiring increased cuing and facilitation -Returned to [employer]  for BLE management where pt transfers back to wheelchair (see above).  PATIENT EDUCATION: Education details: Continue HEP.  Add daily prone stretching for hip flexors and positioning in bed to prevent contracture.  Discussed bringing stubbies to future appt for modified tall kneel and returning to basics for stretching to improve active ability to bring hips anteriorly when standing and walking. Person educated: Patient and  SO Education method: Medical Illustrator Education comprehension: verbalized understanding and needs further education  HOME EXERCISE PROGRAM: -practice donning/doffing prosthetics as much as possible   GOALS: Goals reviewed with patient? Yes  SHORT TERM GOALS: Target date: 03/14/2024    Pt will be independent with initial HEP for improved functional mobility and independence with new prosthetics  Baseline: needs to be updated - pt progressing to safety w/ standing program at home (11/24) Goal status: IN PROGRESS  2.  Patient will complete sit <> stand with LRAD and no more than MinA consistently  Baseline: up to MaxA in // bars; maxA to bariatric walker (11/24); able to stand from 27 high surface with mod A Goal status: IN PROGRESS  3.  Patient will initiate ambulation with an ambulatory device with assist as needed Baseline: only able to ambulate in // bars; ambulates x12 ft w/ CGA and w/c follow (11/24) Goal status: MET  4.  Patient will be able to don/doff B prosthetics with no more than MinA  Baseline: MaxA; IND (11/24) Goal status: MET  LONG TERM GOALS: Target date: 04/25/2024    Pt will be independent with final HEP for improved functional mobility and independence with new prosthetics  Baseline: to be provided Goal status: INITIAL  2.  Patient will complete sit <> stand with LRAD with no more than supervision consistently  Baseline: MaxA in // bars ; able to stand up from 27 high surface with mod A with bariatric walker (02/15/24) Goal status: progressing continue  3.  Patient will ambulate >/=73ft with ambulatory device and no more than MinA Baseline: unable-only ambulating in // bars Goal status: REVISED  4.  Patient will traverse at least 1 community obstacle (curb vs ramp) with LRAD and no more than CGA Baseline: unable on eval ; unable 02/15/24 Goal status: progressing  5.  Patient will don/doff B prosthetics independently  Baseline: MaxA, min  A Goal status: progressing continue (02/15/24)   ASSESSMENT:  CLINICAL IMPRESSION: Returned to mat work this visit to work on passive pelvic and hip alignment in the hopes this will aide her ability to actively bring her hips anteriorly in standing.  This posterior bias has significantly limited her standing and walking tolerance and has increased low back demand.  She does well pressing into quadruped using prosthetics for leverage on mat surface and would benefit from some version of tall kneeling in the future to help piece together passive and active mobility w/ maximal proximal demand.  Will continue per POC.  OBJECTIVE IMPAIRMENTS: Abnormal gait, cardiopulmonary status limiting activity, decreased activity tolerance, decreased balance, decreased endurance, decreased knowledge of use of DME, decreased mobility, difficulty walking, decreased strength, impaired vision/preception, and prosthetic dependency .   ACTIVITY LIMITATIONS: carrying, lifting, bending, standing, squatting, stairs, locomotion level, and caring for others  PARTICIPATION LIMITATIONS: meal prep, cleaning, interpersonal relationship, driving, shopping, community activity, and occupation  PERSONAL FACTORS: Age, Behavior pattern, Fitness, Past/current experiences, Social background, Time since onset of injury/illness/exacerbation, Transportation, and 3+ comorbidities: see above are also affecting patient's functional outcome.   REHAB POTENTIAL: Fair complexity of comorbidities  CLINICAL DECISION MAKING: Evolving/moderate complexity  EVALUATION COMPLEXITY: Moderate  PLAN:  PT FREQUENCY: 2x/week  PT DURATION: 12 weeks  PLANNED INTERVENTIONS: 97164- PT Re-evaluation, 97750- Physical Performance Testing, 97110-Therapeutic exercises, 97530- Therapeutic activity, [id number]- Neuromuscular re-education, 97535- Self Care, [id number]- Manual therapy, [id number]- Gait training, [phone]- Orthotic Initial, [id number]- Prosthetic Initial , [phone]-  Orthotic/Prosthetic subsequent, [phone]- Canalith repositioning, [id number]- Aquatic Therapy, [phone] (1-2 muscles), [id number] (3+ muscles)- Dry Needling, Patient/Family education, Balance training, Stair training, Vestibular training, Visual/preceptual remediation/compensation, DME instructions, Wheelchair mobility training, Cryotherapy, and Moist heat  PLAN FOR NEXT SESSION: sink HEP practice,  Practice straight pathways for endurance and add in turns for increased challenge.  Pt would like to consider alternating mat days and walking days for improved postural work.  Retro-stepping in // bars, glute strength on mat - roll into prone > quadruped > tall kneel with prosthetics??, stretch hip flexor in side-lying/prone; did she bring stubbies - tall kneel w/ these vs full prosthetics?  Daved KATHEE Bull, PT, DPT  03/10/2024, 5:47 PM        "

## 2024-03-11 ENCOUNTER — Encounter (HOSPITAL_COMMUNITY)

## 2024-03-14 ENCOUNTER — Ambulatory Visit

## 2024-03-17 ENCOUNTER — Ambulatory Visit

## 2024-03-17 DIAGNOSIS — R262 Difficulty in walking, not elsewhere classified: Secondary | ICD-10-CM

## 2024-03-17 DIAGNOSIS — R2689 Other abnormalities of gait and mobility: Secondary | ICD-10-CM

## 2024-03-17 DIAGNOSIS — M6281 Muscle weakness (generalized): Secondary | ICD-10-CM

## 2024-03-17 NOTE — Therapy (Signed)
 " OUTPATIENT PHYSICAL THERAPY NEURO TREATMENT   Patient Name: Kelli Hudson MRN: [id number] DOB:January 17, 1991, 34 y.o., female Today's Date: 03/17/2024   PCP: Daphne Lesches, NP REFERRING PROVIDER: Jerona Sage, MD  END OF SESSION:  PT End of Session - 03/17/24 1229     Visit Number 24    Number of Visits 25    Date for Recertification  04/25/24    Authorization Type [employer] dual    Progress Note Due on Visit 10    PT Start Time 1230    PT Stop Time 1315    PT Time Calculation (min) 45 min    Equipment Utilized During Treatment Gait belt    Activity Tolerance Patient tolerated treatment well    Behavior During Therapy WFL for tasks assessed/performed               Past Medical History:  Diagnosis Date   Acute H. pylori gastric ulcer    Chronic kidney disease    Coffee ground emesis    Diabetes mellitus (HCC)    Diabetic gastroparesis (HCC)    DKA (diabetic ketoacidosis) (HCC) 02/24/2021   Gastroparesis    GERD (gastroesophageal reflux disease)    Hypertension    Hyperthyroidism    Intractable nausea and vomiting 04/20/2021   Normocytic anemia 04/20/2020   Prolonged Q-T interval on ECG    Past Surgical History:  Procedure Laterality Date   AMPUTATION Bilateral 10/24/2022   Procedure: BILATERAL BELOW KNEE AMPUTATION;  Surgeon: Sage Jerona GAILS, MD;  Location: [facility] OR;  Service: Orthopedics;  Laterality: Bilateral;   AMPUTATION Bilateral 10/08/2022   Procedure: BILATERAL LEG DEBRIDEMENT;  Surgeon: Sage Jerona GAILS, MD;  Location: [facility] OR;  Service: Orthopedics;  Laterality: Bilateral;   AMPUTATION Bilateral 11/14/2022   Procedure: BILATERAL ABOVE KNEE AMPUTATION;  Surgeon: Sage Jerona GAILS, MD;  Location[facility] OR;  Service: Orthopedics;  Laterality: Bilateral;   AMPUTATION TOE Left 03/10/2018   Procedure: AMPUTATION FIFTH TOE;  Surgeon: Gershon Donnice SAUNDERS, DPM;  Location: [facility];  Service: Podiatry;  Laterality: Left;   APPLICATION OF WOUND VAC  Bilateral 10/12/2022   Procedure: APPLICATION OF WOUND VAC BILATERAL LEGS;  Surgeon: Celena Sharper, MD;  Location: [facility] OR;  Service: Orthopedics;  Laterality: Bilateral;   APPLICATION OF WOUND VAC Bilateral 10/24/2022   Procedure: APPLICATION OF WOUND VAC;  Surgeon: Sage Jerona GAILS, MD;  Location: [facility] OR;  Service: Orthopedics;  Laterality: Bilateral;   BIOPSY  01/28/2020   Procedure: BIOPSY;  Surgeon: Elicia Claw, MD;  Location: [facility];  Service: Gastroenterology;;   BOTOX  INJECTION  08/26/2021   Procedure: BOTOX  INJECTION;  Surgeon: Legrand Victory LITTIE DOUGLAS, MD;  Location: [facility];  Service: Gastroenterology;;   ESOPHAGOGASTRODUODENOSCOPY N/A 01/28/2020   Procedure: ESOPHAGOGASTRODUODENOSCOPY (EGD);  Surgeon: Elicia Claw, MD;  Location: [facility];  Service: Gastroenterology;  Laterality: N/A;   ESOPHAGOGASTRODUODENOSCOPY (EGD) WITH PROPOFOL  Left 09/08/2015   Procedure: ESOPHAGOGASTRODUODENOSCOPY (EGD) WITH PROPOFOL ;  Surgeon: Elsie Cree, MD;  Location: [facility];  Service: Endoscopy;  Laterality: Left;   ESOPHAGOGASTRODUODENOSCOPY (EGD) WITH PROPOFOL  N/A 08/26/2021   Procedure: ESOPHAGOGASTRODUODENOSCOPY (EGD) WITH PROPOFOL ;  Surgeon: Legrand Victory LITTIE DOUGLAS, MD;  Location: [facility];  Service: Gastroenterology;  Laterality: N/A;   GASTRIC STIMULATOR IMPLANT SURGERY  06/2022   at [facility]   I & D EXTREMITY Bilateral 10/12/2022   Procedure: IRRIGATION AND  DEBRIDEMENT  BILATERAL LEGS;  Surgeon: Celena Sharper, MD;  Location: [facility] OR;  Service: Orthopedics;  Laterality: Bilateral;   I &  D EXTREMITY Right 10/13/2022   Procedure: RIGHT THIGH WOUND VAC EXCHANGE;  Surgeon: Kendal Franky SQUIBB, MD;  Location: [facility] OR;  Service: Orthopedics;  Laterality: Right;   I & D EXTREMITY Bilateral 10/17/2022   Procedure: BILATERAL THIGH AND LEG DEBRIDEMENT, PARTIAL BILATERAL FOOT AMPUTATION;  Surgeon: Harden Jerona GAILS, MD;  Location: [facility] OR;  Service: Orthopedics;  Laterality: Bilateral;   I & D EXTREMITY  Bilateral 11/05/2022   Procedure: BILATERAL THIGH DEBRIDEMENT;  Surgeon: Harden Jerona GAILS, MD;  Location: [facility] OR;  Service: Orthopedics;  Laterality: Bilateral;   I & D EXTREMITY Bilateral 11/07/2022   Procedure: BILATERAL THIGH AND LEG DEBRIDEMENT;  Surgeon: Harden Jerona GAILS, MD;  Location: [facility] OR;  Service: Orthopedics;  Laterality: Bilateral;   I & D EXTREMITY Bilateral 11/12/2022   Procedure: BILATERAL LEG DEBRIDEMENTS;  Surgeon: Harden Jerona GAILS, MD;  Location: [facility] OR;  Service: Orthopedics;  Laterality: Bilateral;   IRRIGATION AND DEBRIDEMENT FOOT Bilateral 10/05/2022   Procedure: IRRIGATION AND DEBRIDEMENT FOOT;  Surgeon: Janit Thresa HERO, DPM;  Location: [facility];  Service: Orthopedics/Podiatry;  Laterality: Bilateral;   WISDOM TOOTH EXTRACTION     Patient Active Problem List   Diagnosis Date Noted   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   Hypokalemia 11/27/2022   Effusion, left knee 11/05/2022   MDD (major depressive disorder), recurrent episode, moderate (HCC) 10/16/2022   Necrotizing fasciitis of pelvic region and thigh (HCC) 10/08/2022   Necrotizing fasciitis of lower leg (HCC) 10/08/2022   Necrotizing fasciitis (HCC) 10/08/2022   Sepsis due to cellulitis (HCC) 10/03/2022   Cellulitis of lower extremity 10/03/2022   Multinodular goiter 06/18/2022   Malnutrition of moderate degree 04/18/2022   Intractable vomiting with nausea 04/17/2022   DKA (diabetic ketoacidosis) (HCC) 03/04/2022   Nausea vomiting and diarrhea 12/25/2021   Diabetic gastroparesis (HCC)  10/09/2021   Drug-seeking behavior 09/21/2021   Essential hypertension 08/25/2021   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) 08/25/2021   GERD without esophagitis 08/25/2021   Hematemesis    Prolonged Q-T interval on ECG    Nausea and vomiting 07/20/2021   Anemia of chronic disease 06/06/2021   Dehydration 06/04/2021   Hypomagnesemia 04/21/2021   Ulcerated, foot, left, with fat layer exposed (HCC) 04/20/2021   Uncontrolled type 1 diabetes mellitus with hyperglycemia, with long-term current use of insulin  (HCC) 12/18/2020   DM type 1 (diabetes mellitus, type 1) (HCC) 12/18/2020   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 11/09/2020   Diabetic retinopathy of both eyes associated with type 1 diabetes mellitus (HCC) 09/24/2020   Diabetic gastroparesis associated with type 1 diabetes mellitus (HCC) 08/12/2020   Acute kidney injury superimposed on chronic kidney disease 07/25/2020   Generalized abdominal pain 07/23/2020   GERD (gastroesophageal reflux disease) 07/23/2020   Diabetic nephropathy associated with type 1 diabetes mellitus (HCC) 06/27/2020   Sepsis (HCC) 05/27/2020   HLD (hyperlipidemia) 05/23/2020   Sinus tachycardia 05/09/2020   Anemia 04/20/2020   Depression with anxiety 07/05/2019   Diabetic polyneuropathy associated with type 1 diabetes mellitus (HCC) 09/24/2017  Gastroparesis    Goiter 10/05/2009   DM2 (diabetes mellitus, type 2) (HCC) 10/05/2009   Hypertension associated with diabetes (HCC) 10/05/2009    ONSET DATE: 11/02/23 referral   REFERRING DIAG: [phone] (ICD-10-CM) - S/P AKA (above knee amputation) bilateral (HCC)   THERAPY DIAG:  Difficulty in walking, not elsewhere classified  Muscle weakness (generalized)  Other abnormalities of gait and mobility  Rationale for Evaluation and Treatment: Rehabilitation  SUBJECTIVE:                                                                                                                                                                                              SUBJECTIVE STATEMENT:  Pt arrived with bil prosthetics donned. She is wearing no ply in L socket and 6 ply in R socket.   Pt accompanied by: significant other  PERTINENT HISTORY: DM, diabetic gastroparesis, GERD, HTN, hyperthyroidism, normocytic anemia, B AKA  PAIN:  Are you having pain? No  PRECAUTIONS: Fall   PATIENT GOALS: to learn how to walk with new prosthetics                                                                                                                             TREATMENT  Pt transferred from wheelchair to mat table with SBA with lateral scoot transfer Discussed practicing with stubbies compared to microprocessor knees for remainder of POC to focuse on her ability to use her stubbies with walker at home for few hours a day  SL hip abduction with prosthetic knee bent and AA: 2 x 10 R and L SL hip extension: L LE: 2 x 10 AA with PT supporting prosthetic leg Manually stretched bil hip extensors Soft tissue mobilization to bil hip flexors.  Pt transferred back from mat to wheelchar with SBA with lateral scoot transfer.  PATIENT EDUCATION: Education details: Continue HEP.  Add daily prone stretching for hip flexors and positioning in bed to prevent contracture.  Discussed bringing stubbies to future appt for modified tall kneel and returning to basics for stretching to improve active ability to bring  hips anteriorly when standing and walking. Person educated: Patient and SO Education method: Medical Illustrator Education comprehension: verbalized understanding and needs further education  HOME EXERCISE PROGRAM: -practice donning/doffing prosthetics as much as possible   GOALS: Goals reviewed with patient? Yes  SHORT TERM GOALS: Target date: 03/14/2024    Pt will be independent with initial HEP for improved functional mobility and independence with new  prosthetics  Baseline: needs to be updated - pt progressing to safety w/ standing program at home (11/24) Goal status: IN PROGRESS  2.  Patient will complete sit <> stand with LRAD and no more than MinA consistently  Baseline: up to MaxA in // bars; maxA to bariatric walker (11/24); able to stand from 27 high surface with mod A Goal status: IN PROGRESS  3.  Patient will initiate ambulation with an ambulatory device with assist as needed Baseline: only able to ambulate in // bars; ambulates x12 ft w/ CGA and w/c follow (11/24) Goal status: MET  4.  Patient will be able to don/doff B prosthetics with no more than MinA  Baseline: MaxA; IND (11/24) Goal status: MET  LONG TERM GOALS: Target date: 04/25/2024    Pt will be independent with final HEP for improved functional mobility and independence with new prosthetics  Baseline: to be provided Goal status: INITIAL  2.  Patient will complete sit <> stand with LRAD with no more than supervision consistently  Baseline: MaxA in // bars ; able to stand up from 27 high surface with mod A with bariatric walker (02/15/24) Goal status: progressing continue  3.  Patient will ambulate >/=49ft with ambulatory device and no more than MinA Baseline: unable-only ambulating in // bars Goal status: REVISED  4.  Patient will traverse at least 1 community obstacle (curb vs ramp) with LRAD and no more than CGA Baseline: unable on eval ; unable 02/15/24 Goal status: progressing  5.  Patient will don/doff B prosthetics independently  Baseline: MaxA, min A Goal status: progressing continue (02/15/24)   ASSESSMENT:  CLINICAL IMPRESSION: Pt lives alone and has caregiver 3 hour a day for 5 days a week. She is unable to trial microprocessor prosthetics with caregiver at home as she requires max A and 2 person assist for safety (one for Assist and one for wheelchair follow). Patient has not trialed her stubbies at home either. Overall, patient mainly uses  her wheelchair for functional mobility at home and not using her stubbies or microprocessor knee at home. Patient was educated that in order for her to improve her mobility, we need to focus on mobility with stubbies compared to microprocessor knee as she will be able to perform that more indepdently at home with her walker. When using microprocessor prosthetics, she requires max A x 2 to stand up from elevated surface and only able to ambulate 50 feet with mod to max A. With her stubbies, she may be able to stand up from lower chair and may be able to ambulate further with less assistance. We also emphasized on stretching of her hip flexors today to improve her posture and balance when practicing with stubbies. Pt to bring stubbies next session.   OBJECTIVE IMPAIRMENTS: Abnormal gait, cardiopulmonary status limiting activity, decreased activity tolerance, decreased balance, decreased endurance, decreased knowledge of use of DME, decreased mobility, difficulty walking, decreased strength, impaired vision/preception, and prosthetic dependency .   ACTIVITY LIMITATIONS: carrying, lifting, bending, standing, squatting, stairs, locomotion level, and caring for others  PARTICIPATION LIMITATIONS: meal prep, cleaning,  interpersonal relationship, driving, shopping, community activity, and occupation  PERSONAL FACTORS: Age, Behavior pattern, Fitness, Past/current experiences, Social background, Time since onset of injury/illness/exacerbation, Transportation, and 3+ comorbidities: see above are also affecting patient's functional outcome.   REHAB POTENTIAL: Fair complexity of comorbidities   CLINICAL DECISION MAKING: Evolving/moderate complexity  EVALUATION COMPLEXITY: Moderate  PLAN:  PT FREQUENCY: 2x/week  PT DURATION: 12 weeks  PLANNED INTERVENTIONS: [id number]- PT Re-evaluation, 97750- Physical Performance Testing, 97110-Therapeutic exercises, 97530- Therapeutic activity, [id number]- Neuromuscular re-education,  97535- Self Care, [id number]- Manual therapy, [id number]- Gait training, [phone]- Orthotic Initial, [id number]- Prosthetic Initial , [phone]- Orthotic/Prosthetic subsequent, [phone]- Canalith repositioning, [id number]- Aquatic Therapy, [phone] (1-2 muscles), [id number] (3+ muscles)- Dry Needling, Patient/Family education, Balance training, Stair training, Vestibular training, Visual/preceptual remediation/compensation, DME instructions, Wheelchair mobility training, Cryotherapy, and Moist heat  PLAN FOR NEXT SESSION: sink HEP practice,  Practice straight pathways for endurance and add in turns for increased challenge.  Pt would like to consider alternating mat days and walking days for improved postural work.  Retro-stepping in // bars, glute strength on mat - roll into prone > quadruped > tall kneel with prosthetics??, stretch hip flexor in side-lying/prone; did she bring stubbies - tall kneel w/ these vs full prosthetics?  Raj LOISE Blanch, PT, DPT  03/17/2024, 12:30 PM        "

## 2024-03-18 ENCOUNTER — Inpatient Hospital Stay (HOSPITAL_COMMUNITY): Admission: RE | Admit: 2024-03-18 | Source: Ambulatory Visit

## 2024-03-21 ENCOUNTER — Ambulatory Visit

## 2024-03-24 ENCOUNTER — Inpatient Hospital Stay (HOSPITAL_COMMUNITY)
Admission: EM | Admit: 2024-03-24 | Source: Home / Self Care | Attending: Internal Medicine | Admitting: Internal Medicine

## 2024-03-24 ENCOUNTER — Encounter (HOSPITAL_COMMUNITY): Payer: Self-pay

## 2024-03-24 ENCOUNTER — Emergency Department (HOSPITAL_COMMUNITY)

## 2024-03-24 ENCOUNTER — Ambulatory Visit: Admitting: Physical Therapy

## 2024-03-24 ENCOUNTER — Other Ambulatory Visit: Payer: Self-pay

## 2024-03-24 DIAGNOSIS — R1084 Generalized abdominal pain: Secondary | ICD-10-CM

## 2024-03-24 DIAGNOSIS — N179 Acute kidney failure, unspecified: Secondary | ICD-10-CM | POA: Diagnosis present

## 2024-03-24 DIAGNOSIS — E1010 Type 1 diabetes mellitus with ketoacidosis without coma: Principal | ICD-10-CM

## 2024-03-24 LAB — BLOOD GAS, VENOUS
Acid-base deficit: 9.4 mmol/L — ABNORMAL HIGH (ref 0.0–2.0)
Bicarbonate: 15.5 mmol/L — ABNORMAL LOW (ref 20.0–28.0)
Drawn by: 70290
O2 Saturation: 44.3 %
Patient temperature: 37
pCO2, Ven: 30 mmHg — ABNORMAL LOW (ref 44–60)
pH, Ven: 7.32 (ref 7.25–7.43)
pO2, Ven: 31 mmHg — CL (ref 32–45)

## 2024-03-24 LAB — CBC WITH DIFFERENTIAL/PLATELET
Abs Immature Granulocytes: 0.07 10*3/uL (ref 0.00–0.07)
Basophils Absolute: 0.1 10*3/uL (ref 0.0–0.1)
Basophils Relative: 1 %
Eosinophils Absolute: 0 10*3/uL (ref 0.0–0.5)
Eosinophils Relative: 0 %
HCT: 39.7 % (ref 36.0–46.0)
Hemoglobin: 12 g/dL (ref 12.0–15.0)
Immature Granulocytes: 1 %
Lymphocytes Relative: 12 %
Lymphs Abs: 1.6 10*3/uL (ref 0.7–4.0)
MCH: 29 pg (ref 26.0–34.0)
MCHC: 30.2 g/dL (ref 30.0–36.0)
MCV: 95.9 fL (ref 80.0–100.0)
Monocytes Absolute: 0.2 10*3/uL (ref 0.1–1.0)
Monocytes Relative: 2 %
Neutro Abs: 11.5 10*3/uL — ABNORMAL HIGH (ref 1.7–7.7)
Neutrophils Relative %: 84 %
Platelets: 499 10*3/uL — ABNORMAL HIGH (ref 150–400)
RBC: 4.14 MIL/uL (ref 3.87–5.11)
RDW: 14.4 % (ref 11.5–15.5)
WBC: 13.5 10*3/uL — ABNORMAL HIGH (ref 4.0–10.5)
nRBC: 0 % (ref 0.0–0.2)

## 2024-03-24 LAB — CBG MONITORING, ED
Glucose-Capillary: 207 mg/dL — ABNORMAL HIGH (ref 70–99)
Glucose-Capillary: 184 mg/dL — ABNORMAL HIGH (ref 70–99)
Glucose-Capillary: 189 mg/dL — ABNORMAL HIGH (ref 70–99)
Glucose-Capillary: 197 mg/dL — ABNORMAL HIGH (ref 70–99)
Glucose-Capillary: 216 mg/dL — ABNORMAL HIGH (ref 70–99)
Glucose-Capillary: 232 mg/dL — ABNORMAL HIGH (ref 70–99)
Glucose-Capillary: 208 mg/dL — ABNORMAL HIGH (ref 70–99)
Glucose-Capillary: 210 mg/dL — ABNORMAL HIGH (ref 70–99)
Glucose-Capillary: 236 mg/dL — ABNORMAL HIGH (ref 70–99)
Glucose-Capillary: 199 mg/dL — ABNORMAL HIGH (ref 70–99)
Glucose-Capillary: 188 mg/dL — ABNORMAL HIGH (ref 70–99)
Glucose-Capillary: 188 mg/dL — ABNORMAL HIGH (ref 70–99)
Glucose-Capillary: 201 mg/dL — ABNORMAL HIGH (ref 70–99)
Glucose-Capillary: 214 mg/dL — ABNORMAL HIGH (ref 70–99)
Glucose-Capillary: 210 mg/dL — ABNORMAL HIGH (ref 70–99)
Glucose-Capillary: 179 mg/dL — ABNORMAL HIGH (ref 70–99)

## 2024-03-24 LAB — I-STAT CHEM 8, ED
BUN: 62 mg/dL — ABNORMAL HIGH (ref 6–20)
Calcium, Ion: 1.16 mmol/L (ref 1.15–1.40)
Chloride: 112 mmol/L — ABNORMAL HIGH (ref 98–111)
Creatinine, Ser: 6.7 mg/dL — ABNORMAL HIGH (ref 0.44–1.00)
Glucose, Bld: 306 mg/dL — ABNORMAL HIGH (ref 70–99)
HCT: 37 % (ref 36.0–46.0)
Hemoglobin: 12.6 g/dL (ref 12.0–15.0)
Potassium: 4.2 mmol/L (ref 3.5–5.1)
Sodium: 141 mmol/L (ref 135–145)
TCO2: 15 mmol/L — ABNORMAL LOW (ref 22–32)

## 2024-03-24 LAB — COMPREHENSIVE METABOLIC PANEL WITH GFR
ALT: 24 U/L (ref 0–44)
AST: 20 U/L (ref 15–41)
Albumin: 3.1 g/dL — ABNORMAL LOW (ref 3.5–5.0)
Alkaline Phosphatase: 128 U/L — ABNORMAL HIGH (ref 38–126)
Anion gap: 20 — ABNORMAL HIGH (ref 5–15)
BUN: 59 mg/dL — ABNORMAL HIGH (ref 6–20)
CO2: 16 mmol/L — ABNORMAL LOW (ref 22–32)
Calcium: 9.7 mg/dL (ref 8.9–10.3)
Chloride: 105 mmol/L (ref 98–111)
Creatinine, Ser: 6.05 mg/dL — ABNORMAL HIGH (ref 0.44–1.00)
GFR, Estimated: 9 mL/min — ABNORMAL LOW
Glucose, Bld: 322 mg/dL — ABNORMAL HIGH (ref 70–99)
Potassium: 4.2 mmol/L (ref 3.5–5.1)
Sodium: 141 mmol/L (ref 135–145)
Total Bilirubin: 0.5 mg/dL (ref 0.0–1.2)
Total Protein: 7.5 g/dL (ref 6.5–8.1)

## 2024-03-24 LAB — BASIC METABOLIC PANEL WITH GFR
Anion gap: 20 — ABNORMAL HIGH (ref 5–15)
Anion gap: 16 — ABNORMAL HIGH (ref 5–15)
Anion gap: 15 (ref 5–15)
BUN: 58 mg/dL — ABNORMAL HIGH (ref 6–20)
BUN: 58 mg/dL — ABNORMAL HIGH (ref 6–20)
BUN: 55 mg/dL — ABNORMAL HIGH (ref 6–20)
CO2: 15 mmol/L — ABNORMAL LOW (ref 22–32)
CO2: 17 mmol/L — ABNORMAL LOW (ref 22–32)
CO2: 18 mmol/L — ABNORMAL LOW (ref 22–32)
Calcium: 9.3 mg/dL (ref 8.9–10.3)
Calcium: 9.2 mg/dL (ref 8.9–10.3)
Calcium: 9 mg/dL (ref 8.9–10.3)
Chloride: 109 mmol/L (ref 98–111)
Chloride: 112 mmol/L — ABNORMAL HIGH (ref 98–111)
Chloride: 111 mmol/L (ref 98–111)
Creatinine, Ser: 6.12 mg/dL — ABNORMAL HIGH (ref 0.44–1.00)
Creatinine, Ser: 5.92 mg/dL — ABNORMAL HIGH (ref 0.44–1.00)
Creatinine, Ser: 5.6 mg/dL — ABNORMAL HIGH (ref 0.44–1.00)
GFR, Estimated: 9 mL/min — ABNORMAL LOW
GFR, Estimated: 9 mL/min — ABNORMAL LOW
GFR, Estimated: 10 mL/min — ABNORMAL LOW
Glucose, Bld: 307 mg/dL — ABNORMAL HIGH (ref 70–99)
Glucose, Bld: 263 mg/dL — ABNORMAL HIGH (ref 70–99)
Glucose, Bld: 225 mg/dL — ABNORMAL HIGH (ref 70–99)
Potassium: 4.2 mmol/L (ref 3.5–5.1)
Potassium: 4.2 mmol/L (ref 3.5–5.1)
Potassium: 3.7 mmol/L (ref 3.5–5.1)
Sodium: 144 mmol/L (ref 135–145)
Sodium: 145 mmol/L (ref 135–145)
Sodium: 144 mmol/L (ref 135–145)

## 2024-03-24 LAB — URINALYSIS, ROUTINE W REFLEX MICROSCOPIC
Bilirubin Urine: NEGATIVE
Glucose, UA: >=500 mg/dL — AB
Ketones, ur: 20 mg/dL — AB
Nitrite: NEGATIVE
Protein, ur: >=300 mg/dL — AB
Specific Gravity, Urine: 1.017 (ref 1.005–1.030)
WBC, UA: >50 WBC/hpf (ref 0–5)
pH: 5 (ref 5.0–8.0)

## 2024-03-24 LAB — CBC
HCT: 35.7 % — ABNORMAL LOW (ref 36.0–46.0)
Hemoglobin: 10.6 g/dL — ABNORMAL LOW (ref 12.0–15.0)
MCH: 28.9 pg (ref 26.0–34.0)
MCHC: 29.7 g/dL — ABNORMAL LOW (ref 30.0–36.0)
MCV: 97.3 fL (ref 80.0–100.0)
Platelets: 435 10*3/uL — ABNORMAL HIGH (ref 150–400)
RBC: 3.67 MIL/uL — ABNORMAL LOW (ref 3.87–5.11)
RDW: 14.3 % (ref 11.5–15.5)
WBC: 16.2 10*3/uL — ABNORMAL HIGH (ref 4.0–10.5)
nRBC: 0 % (ref 0.0–0.2)

## 2024-03-24 LAB — CREATININE, URINE, RANDOM: Creatinine, Urine: 90 mg/dL

## 2024-03-24 LAB — BETA-HYDROXYBUTYRIC ACID
Beta-Hydroxybutyric Acid: 3.38 mmol/L — ABNORMAL HIGH (ref 0.05–0.27)
Beta-Hydroxybutyric Acid: 2.11 mmol/L — ABNORMAL HIGH (ref 0.05–0.27)
Beta-Hydroxybutyric Acid: 0.6 mmol/L — ABNORMAL HIGH (ref 0.05–0.27)

## 2024-03-24 LAB — LIPASE, BLOOD: Lipase: 17 U/L (ref 11–51)

## 2024-03-24 LAB — MAGNESIUM: Magnesium: 2.5 mg/dL — ABNORMAL HIGH (ref 1.7–2.4)

## 2024-03-24 LAB — SODIUM, URINE, RANDOM: Sodium, Ur: 81 mmol/L

## 2024-03-24 LAB — HCG, SERUM, QUALITATIVE: Preg, Serum: NEGATIVE

## 2024-03-24 MED ORDER — DEXTROSE IN LACTATED RINGERS 5 % IV SOLN: INTRAVENOUS | Status: DC

## 2024-03-24 MED ORDER — HEPARIN SODIUM (PORCINE) 5000 UNIT/ML IJ SOLN
5000.0000 [IU] | Freq: Three times a day (TID) | INTRAMUSCULAR | Status: AC
  Administered 2024-03-24 – 2024-03-25 (×5): 5000 [IU] via SUBCUTANEOUS
  Filled 2024-03-24 (×6): qty 1

## 2024-03-24 MED ORDER — HYDROMORPHONE HCL 1 MG/ML IJ SOLN
0.5000 mg | Freq: Once | INTRAMUSCULAR | Status: AC
  Administered 2024-03-24: 0.5 mg via INTRAVENOUS
  Filled 2024-03-24: qty 1

## 2024-03-24 MED ORDER — LACTATED RINGERS IV SOLN: INTRAVENOUS | Status: DC

## 2024-03-24 MED ORDER — CARVEDILOL 6.25 MG PO TABS
6.2500 mg | ORAL_TABLET | Freq: Two times a day (BID) | ORAL | Status: AC
  Administered 2024-03-24 – 2024-03-25 (×3): 6.25 mg via ORAL
  Filled 2024-03-24: qty 1
  Filled 2024-03-24 (×3): qty 2

## 2024-03-24 MED ORDER — LORAZEPAM 2 MG/ML IJ SOLN
0.5000 mg | Freq: Once | INTRAMUSCULAR | Status: AC
  Administered 2024-03-24: 0.5 mg via INTRAVENOUS
  Filled 2024-03-24: qty 1

## 2024-03-24 MED ORDER — SODIUM CHLORIDE 0.45 % IV SOLN: INTRAVENOUS | Status: DC

## 2024-03-24 MED ORDER — INSULIN REGULAR(HUMAN) IN NACL 100-0.9 UT/100ML-% IV SOLN
INTRAVENOUS | Status: DC
  Administered 2024-03-24: 5.5 [IU]/h via INTRAVENOUS
  Filled 2024-03-24: qty 100

## 2024-03-24 MED ORDER — TRIMETHOBENZAMIDE HCL 100 MG/ML IM SOLN
200.0000 mg | Freq: Three times a day (TID) | INTRAMUSCULAR | Status: AC | PRN
  Administered 2024-03-24 – 2024-03-25 (×2): 200 mg via INTRAMUSCULAR
  Filled 2024-03-24 (×4): qty 2

## 2024-03-24 MED ORDER — METOCLOPRAMIDE HCL 5 MG/ML IJ SOLN
10.0000 mg | Freq: Three times a day (TID) | INTRAMUSCULAR | Status: AC
  Administered 2024-03-24 – 2024-03-25 (×5): 10 mg via INTRAVENOUS
  Filled 2024-03-24 (×5): qty 2

## 2024-03-24 MED ORDER — SODIUM CHLORIDE 0.9 % IV SOLN
2.0000 g | INTRAVENOUS | Status: AC
  Administered 2024-03-24 – 2024-03-25 (×2): 2 g via INTRAVENOUS
  Filled 2024-03-24 (×2): qty 20

## 2024-03-24 MED ORDER — POLYETHYLENE GLYCOL 3350 17 G PO PACK: 17.0000 g | PACK | Freq: Every day | ORAL | Status: AC | PRN

## 2024-03-24 MED ORDER — SODIUM CHLORIDE 0.9 % IV SOLN: INTRAVENOUS | Status: DC

## 2024-03-24 MED ORDER — HYDRALAZINE HCL 20 MG/ML IJ SOLN
10.0000 mg | Freq: Four times a day (QID) | INTRAMUSCULAR | Status: AC | PRN
  Filled 2024-03-24: qty 1

## 2024-03-24 MED ORDER — DEXTROSE-SODIUM CHLORIDE 5-0.45 % IV SOLN: INTRAVENOUS | Status: DC

## 2024-03-24 MED ORDER — METRONIDAZOLE 500 MG/100ML IV SOLN
500.0000 mg | Freq: Two times a day (BID) | INTRAVENOUS | Status: AC
  Administered 2024-03-24 – 2024-03-25 (×4): 500 mg via INTRAVENOUS
  Filled 2024-03-24 (×4): qty 100

## 2024-03-24 MED ORDER — SODIUM CHLORIDE 0.9 % IV SOLN
12.5000 mg | Freq: Once | INTRAVENOUS | Status: AC
  Administered 2024-03-24: 12.5 mg via INTRAVENOUS
  Filled 2024-03-24: qty 12.5

## 2024-03-24 MED ORDER — HYDROMORPHONE HCL 1 MG/ML IJ SOLN
0.5000 mg | INTRAMUSCULAR | Status: AC | PRN
  Administered 2024-03-24 – 2024-03-25 (×9): 0.5 mg via INTRAVENOUS
  Filled 2024-03-24 (×7): qty 1
  Filled 2024-03-24 (×2): qty 0.5

## 2024-03-24 MED ORDER — SODIUM CHLORIDE 0.9 % IV BOLUS
1000.0000 mL | Freq: Once | INTRAVENOUS | Status: AC
  Administered 2024-03-24: 1000 mL via INTRAVENOUS

## 2024-03-24 MED ORDER — ALBUTEROL SULFATE (2.5 MG/3ML) 0.083% IN NEBU: 2.5000 mg | INHALATION_SOLUTION | RESPIRATORY_TRACT | Status: AC | PRN

## 2024-03-24 MED ORDER — ACETAMINOPHEN 325 MG PO TABS: 650.0000 mg | ORAL_TABLET | Freq: Four times a day (QID) | ORAL | Status: AC | PRN

## 2024-03-24 MED ORDER — PANTOPRAZOLE SODIUM 40 MG IV SOLR
40.0000 mg | Freq: Once | INTRAVENOUS | Status: AC
  Administered 2024-03-24: 40 mg via INTRAVENOUS
  Filled 2024-03-24: qty 10

## 2024-03-24 MED ORDER — LORAZEPAM 2 MG/ML IJ SOLN
0.5000 mg | INTRAMUSCULAR | Status: AC | PRN
  Administered 2024-03-24 – 2024-03-25 (×3): 0.5 mg via INTRAVENOUS
  Filled 2024-03-24 (×3): qty 1

## 2024-03-24 MED ORDER — DEXTROSE 50 % IV SOLN: 0.0000 mL | INTRAVENOUS | Status: AC | PRN

## 2024-03-24 MED ORDER — ACETAMINOPHEN 650 MG RE SUPP: 650.0000 mg | Freq: Four times a day (QID) | RECTAL | Status: AC | PRN

## 2024-03-24 MED ORDER — LABETALOL HCL 5 MG/ML IV SOLN
10.0000 mg | INTRAVENOUS | Status: AC | PRN
  Administered 2024-03-24: 10 mg via INTRAVENOUS
  Filled 2024-03-24: qty 4

## 2024-03-24 MED ORDER — PANTOPRAZOLE SODIUM 40 MG IV SOLR
40.0000 mg | INTRAVENOUS | Status: AC
  Administered 2024-03-25: 40 mg via INTRAVENOUS
  Filled 2024-03-24: qty 10

## 2024-03-24 MED ORDER — METOCLOPRAMIDE HCL 5 MG/ML IJ SOLN
10.0000 mg | Freq: Once | INTRAMUSCULAR | Status: AC
  Administered 2024-03-24: 10 mg via INTRAVENOUS
  Filled 2024-03-24: qty 2

## 2024-03-24 MED ORDER — HYDROMORPHONE HCL 1 MG/ML IJ SOLN
0.5000 mg | INTRAMUSCULAR | Status: AC | PRN
  Administered 2024-03-24 (×2): 0.5 mg via INTRAVENOUS
  Filled 2024-03-24 (×2): qty 1

## 2024-03-24 NOTE — ED Notes (Signed)
 Pt has had x-ray and ct scan. Insulin  drip started, ativan  seems to have curbed the nausea

## 2024-03-24 NOTE — H&P (Addendum)
 " Telemedicine History and Physical   Referring Provider: Almarie Burner Telemedicine Provider: Donalda Applebaum MD Provider Location: [facility] Patient Location: [facility] Referring Diagnosis: AKI on CKD 4 Patient Name and DOB verified: yes Patient consented to Telemedicine Evaluation:yes RN virtual assistant: Dene Single RN Video encounter time and date:03/24/24 at 6:25 am  Patient: Kelli Hudson FMW:[id number] DOB: Oct 05, 1990 PCP: Arloa Jarvis, NP   Chief Complaint:  Chief Complaint  Patient presents with   Abdominal Pain   Emesis   HPI: Kelli Hudson is a 34 y.o. female with medical history significant of DM-1, HTN, CKD 4, PAD-s/p bilateral AKA, diabetic gastroparesis-s/p gastric stimulator implant 2024 who presented with nausea, vomiting and abdominal pain x 1 day.  Per patient-she was in her usual state of health-yesterday afternoon she suddenly had numerous episodes of nonbloody-and nonbilious vomiting.  She had no diarrhea.  She then started having diffuse abdominal pain but predominantly in her mid to lower abdomen.  Pain was 10/10-without any aggravating or relieving factors-no radiation.  She denies any fever.  She denies any URI symptoms.  She denies any sick contacts.  She subsequently presented to the ED where she was found to have AKI, elevated blood sugars-with an elevated anion gap and metabolic acidosis.  She was thought to have some component of DKA-started on insulin  infusion-she subsequently underwent CT imaging which showed colitis.  Hospitalist service was then asked to admit this patient for further evaluation and treatment.  No fever No headache Intermittent mild shortness of breath with exertion but no shortness of breath at rest. No chest pain + Abdominal pain + Nausea/vomiting No diarrhea No hematuria No dysuria No hematochezia/melena.   Review of Systems: As mentioned in the history of present illness. All other systems  reviewed and are negative. Past Medical History:  Diagnosis Date   Acute H. pylori gastric ulcer    Chronic kidney disease    Coffee ground emesis    Diabetes mellitus (HCC)    Diabetic gastroparesis (HCC)    DKA (diabetic ketoacidosis) (HCC) 02/24/2021   Gastroparesis    GERD (gastroesophageal reflux disease)    Hypertension    Hyperthyroidism    Intractable nausea and vomiting 04/20/2021   Normocytic anemia 04/20/2020   Prolonged Q-T interval on ECG    Past Surgical History:  Procedure Laterality Date   AMPUTATION Bilateral 10/24/2022   Procedure: BILATERAL BELOW KNEE AMPUTATION;  Surgeon: Harden Jerona GAILS, MD;  Location: [facility] OR;  Service: Orthopedics;  Laterality: Bilateral;   AMPUTATION Bilateral 10/08/2022   Procedure: BILATERAL LEG DEBRIDEMENT;  Surgeon: Harden Jerona GAILS, MD;  Location: [facility] OR;  Service: Orthopedics;  Laterality: Bilateral;   AMPUTATION Bilateral 11/14/2022   Procedure: BILATERAL ABOVE KNEE AMPUTATION;  Surgeon: Harden Jerona GAILS, MD;  Location: [facility] OR;  Service: Orthopedics;  Laterality: Bilateral;   AMPUTATION TOE Left 03/10/2018   Procedure: AMPUTATION FIFTH TOE;  Surgeon: Gershon Donnice SAUNDERS, DPM;  Location: [facility];  Service: Podiatry;  Laterality: Left;   APPLICATION OF WOUND VAC Bilateral 10/12/2022   Procedure: APPLICATION OF WOUND VAC BILATERAL LEGS;  Surgeon: Celena Sharper, MD;  Location: [facility] OR;  Service: Orthopedics;  Laterality: Bilateral;   APPLICATION OF WOUND VAC Bilateral 10/24/2022   Procedure: APPLICATION OF WOUND VAC;  Surgeon: Harden Jerona GAILS, MD;  Location: [facility] OR;  Service: Orthopedics;  Laterality: Bilateral;   BIOPSY  01/28/2020   Procedure: BIOPSY;  Surgeon: Elicia Claw, MD;  Location: [facility];  Service: Gastroenterology;;   BOTOX   INJECTION  08/26/2021   Procedure: BOTOX  INJECTION;  Surgeon: Legrand Victory LITTIE DOUGLAS, MD;  Location: [facility];  Service: Gastroenterology;;   ESOPHAGOGASTRODUODENOSCOPY N/A 01/28/2020   Procedure:  ESOPHAGOGASTRODUODENOSCOPY (EGD);  Surgeon: Elicia Claw, MD;  Location: [facility];  Service: Gastroenterology;  Laterality: N/A;   ESOPHAGOGASTRODUODENOSCOPY (EGD) WITH PROPOFOL  Left 09/08/2015   Procedure: ESOPHAGOGASTRODUODENOSCOPY (EGD) WITH PROPOFOL ;  Surgeon: Elsie Cree, MD;  Location: [facility] END[facility]COPY;  Service: Endoscopy;  Laterality: Left;   ESOPHAGOGASTRODUODENOSCOPY (EGD) WITH PROPOFOL  N/A 08/26/2021   Procedure: ESOPHAGOGASTRODUODENOSCOPY (EGD) WITH PROPOFOL ;  Surgeon: Legrand Victory LITTIE DOUGLAS, MD;  Location: [facility];  Service: Gastroenterology;  Laterality: N/A;   GASTRIC STIMULATOR IMPLANT SURGERY  06/2022   at [facility]   I & D EXTREMITY Bilateral 10/12/2022   Procedure: IRRIGATION AND  DEBRIDEMENT  BILATERAL LEGS;  Surgeon: Celena Sharper, MD;  Location: [facility] OR;  Service: Orthopedics;  Laterality: Bilateral;   I & D EXTREMITY Right 10/13/2022   Procedure: RIGHT THIGH WOUND VAC EXCHANGE;  Surgeon: Kendal Franky SQUIBB, MD;  Location: [facility] OR;  Service: Orthopedics;  Laterality: Right;   I & D EXTREMITY Bilateral 10/17/2022   Procedure: BILATERAL THIGH AND LEG DEBRIDEMENT, PARTIAL BILATERAL FOOT AMPUTATION;  Surgeon: Harden Jerona GAILS, MD;  Location: [facility] OR;  Service: Orthopedics;  Laterality: Bilateral;   I & D EXTREMITY Bilateral 11/05/2022   Procedure: BILATERAL THIGH DEBRIDEMENT;  Surgeon: Harden Jerona GAILS, MD;  Location: [facility] OR;  Service: Orthopedics;  Laterality: Bilateral;   I & D EXTREMITY Bilateral 11/07/2022   Procedure: BILATERAL THIGH AND LEG DEBRIDEMENT;  Surgeon: Harden Jerona GAILS, MD;  Location: [facility] OR;  Service: Orthopedics;  Laterality: Bilateral;   I & D EXTREMITY Bilateral 11/12/2022   Procedure: BILATERAL LEG DEBRIDEMENTS;  Surgeon: Harden Jerona GAILS, MD;  Location: [facility] OR;  Service: Orthopedics;  Laterality: Bilateral;   IRRIGATION AND DEBRIDEMENT FOOT Bilateral 10/05/2022   Procedure: IRRIGATION AND DEBRIDEMENT FOOT;  Surgeon: Janit Thresa HERO, DPM;  Location: [facility]S;  Service:  Orthopedics/Podiatry;  Laterality: Bilateral;   WISDOM TOOTH EXTRACTION     Social History:  reports that she has never smoked. She has never used smokeless tobacco. She reports current alcohol use of about 1.0 standard drink of alcohol per week. She reports that she does not use drugs.  Allergies[1]  Family History  Problem Relation Age of Onset   Lung cancer Mother    Diabetes Mother    Bipolar disorder Father    Liver cancer Maternal Grandfather    Diabetes Maternal Aunt        x 2   Pancreatic cancer Maternal Uncle    Prostate cancer Maternal Uncle    Breast cancer Other        maternal great aunt   Heart disease Other    Ovarian cancer Other        maternal great aunt   Kidney disease Other        maternal great aunt   Colon cancer Neg Hx    Stomach cancer Neg Hx     Prior to Admission medications  Medication Sig Start Date End Date Taking? Authorizing Provider  amLODipine  (NORVASC ) 10 MG tablet Take 1 tablet (10 mg total) by mouth daily. 12/04/22  Yes Elgergawy, Brayton RAMAN, MD  atorvastatin  (LIPITOR) 20 MG tablet Take 20 mg by mouth daily.   Yes [provider]  carvedilol  (COREG ) 6.25 MG tablet Take 1 tablet (6.25 mg total) by mouth 2 (two) times daily with a meal. 12/03/22  Yes Elgergawy, Brayton RAMAN, MD  diphenoxylate-atropine (LOMOTIL) 2.5-0.025 MG tablet SMARTSIG:1 Tablet(s) By Mouth 11/19/23  Yes [provider]  etonogestrel  (NEXPLANON ) 68 MG IMPL implant 1 each by Subdermal route continuous.   Yes [provider]  gabapentin  (NEURONTIN ) 300 MG capsule Take 1 capsule (300 mg total) by mouth every 8 (eight) hours. 11/22/22  Yes Lue Elsie BROCKS, MD  hydrOXYzine  (ATARAX ) 10 MG tablet Take 1 tablet (10 mg total) by mouth 3 (three) times daily as needed for anxiety. 03/07/23  Yes Krishnan, Gokul, MD  insulin  degludec (TRESIBA  FLEXTOUCH) 200 UNIT/ML FlexTouch Pen Inject 15 Units into the skin 2 (two) times daily. If CBG is > 150   Yes [provider]  insulin  lispro (HUMALOG ) 100 UNIT/ML KwikPen PLEASE SEE ATTACHED FOR DETAILED DIRECTIONS 01/31/24  Yes [provider]  LANTUS  SOLOSTAR 100 UNIT/ML Solostar Pen PLEASE SEE ATTACHED FOR DETAILED DIRECTIONS 02/24/24  Yes [provider]  methocarbamol  (ROBAXIN ) 500 MG tablet Take 500 mg by mouth 3 (three) times daily as needed for muscle spasms. 11/22/22  Yes [provider]  pantoprazole  (PROTONIX ) 40 MG tablet Take 40 mg by mouth every morning. 01/10/24  Yes [provider]  traZODone  (DESYREL ) 150 MG tablet Take 150 mg by mouth at bedtime. 01/14/23  Yes [provider]  acetaminophen  (TYLENOL ) 500 MG tablet Take 1 tablet (500 mg total) by mouth every 6 (six) hours as needed. 04/04/23   Nivia Colon, PA-C  amoxicillin -clavulanate (AUGMENTIN ) 875-125 MG tablet Take 1 tablet by mouth every 12 (twelve) hours. 07/08/23   Hildegard Loge, PA-C  ascorbic acid  (VITAMIN C ) 1000 MG tablet Take 1 tablet (1,000 mg total) by mouth daily. Patient taking differently: Take 1,000 mg by mouth once a week. 11/22/22   Lue Elsie BROCKS, MD  benzonatate  (TESSALON ) 100 MG capsule Take 1 capsule (100 mg total) by mouth every 8 (eight) hours. 04/04/23   Nivia Colon, PA-C  Continuous Glucose Transmitter (DEXCOM G6 TRANSMITTER) MISC 1 Device by Does not apply route as directed. 06/18/22   Shamleffer, Ibtehal Jaralla, MD  famotidine  (PEPCID ) 20 MG tablet Take 1 tablet (20 mg total) by mouth 2 (two) times daily. 12/03/22   Elgergawy, Brayton RAMAN, MD  furosemide  (LASIX ) 20 MG tablet Take 20 mg by mouth daily as needed. 03/01/24   [provider]  insulin  aspart (NOVOLOG ) 100 UNIT/ML injection Inject 0-9 Units into the skin 3 (three) times daily with meals. Patient taking differently: Inject 10-30 Units into the skin 3 (three) times daily with meals. 12/03/22   Elgergawy, Brayton RAMAN, MD  Insulin  Pen Needle 32G X 4 MM MISC 1 Device by Does not apply route in the morning, at  noon, in the evening, and at bedtime. 06/18/22   Shamleffer, Donell Cardinal, MD  MAGNESIUM  PO Take 1 tablet by mouth daily.    [provider]  metoCLOPramide  (REGLAN ) 10 MG tablet Take 1 tablet (10 mg total) by mouth every 6 (six) hours. Patient not taking: Reported on 03/10/2024 03/23/23   Mannie Pac T, DO  Multiple Vitamin (MULTIVITAMIN WITH MINERALS) TABS tablet Take 1 tablet by mouth daily. 11/22/22   Lue Elsie BROCKS, MD  oxyCODONE  (ROXICODONE ) 5 MG immediate release tablet Take 1 tablet (5 mg total) by mouth every 4 (four) hours as needed for severe pain (pain score 7-10). 07/08/23   Hildegard Loge, PA-C  potassium chloride  SA (KLOR-CON  M) 20 MEQ tablet Take 20 mEq by mouth daily.    [provider]  spironolactone (ALDACTONE) 25 MG tablet Take 25 mg by mouth 2 (two) times daily.    [provider]    Physical Exam: done by Dene Single RN during this encounter Vitals:   03/24/24 0430 03/24/24 0500 03/24/24 0530 03/24/24 0600  BP: (!) 176/111 (!) 166/106 (!) 160/109 (!) 159/101  Pulse: (!) 116 (!) 119 (!) 114 (!) 117  Resp: 13 16 14 14   Temp:    98.6 F (37 C)  TempSrc:      SpO2: 98% 99% 97% 98%  Weight:      Height:       Gen Exam:Alert awake-not in any distress HEENT:atraumatic, normocephalic Chest: B/L clear to auscultation anteriorly CVS:S1S2 regular Abdomen: Nondistended-soft-appears to have diffuse tenderness-does not appear to have peritonitis on this virtual exam. Extremities:s/p B/L AKA Neurology: Non focal Skin: no rash  Data Reviewed:     Latest Ref Rng & Units 03/24/2024    3:01 AM 03/24/2024    2:41 AM 02/26/2024    8:21 AM  CBC  WBC 4.0 - 10.5 K/uL  13.5    Hemoglobin 12.0 - 15.0 g/dL 87.3  87.9  88.8   Hematocrit 36.0 - 46.0 % 37.0  39.7    Platelets 150 - 400 K/uL  499         Latest Ref Rng & Units 03/24/2024    4:20 AM 03/24/2024    3:01 AM 03/24/2024    2:41 AM  BMP  Glucose 70 - 99 mg/dL 692  693  677   BUN 6 - 20 mg/dL 58   62  59   Creatinine 0.44 - 1.00 mg/dL 3.87  3.29  3.94   Sodium 135 - 145 mmol/L 144  141  141   Potassium 3.5 - 5.1 mmol/L 4.2  4.2  4.2   Chloride 98 - 111 mmol/L 109  112  105   CO2 22 - 32 mmol/L 15   16   Calcium  8.9 - 10.3 mg/dL 9.3   9.7      Assessment and Plan: Colitis CT scan does show thickening of cecum/ascending/descending and sigmoid colon colon-she does not have diarrhea but vomiting Start Rocephin /Flagyl  If diarrhea recurs-Will send out stool studies.  Probably gastroparesis flare Likely secondary to above QTc significantly prolonged-hold Reglan -repeat EKG in a few hours-and use Reglan  sparingly She does have a gastric stimulator in place-and takes Reglan  multiple times a day at home.  AKI on CKD 4 Suspect this is hemodynamically mediated due to vomiting/colitis CT abdomen negative for any hydronephrosis/obstruction UA currently pending She denies taking NSAIDs or ACEI/ARB. No indications for HD at this point but will need to be watched closely Will get renal opinion-hopefully some of her AKI is reversible after treating with IV fluids/IV antibiotics.  Probable DKA-history of DM-1 Does have labs consistent with DKA-however her blood sugars are not terribly elevated-however she is a type I diabetic-and is at risk for DKA.  Elevated ketones could be explained by starvation ketoacidosis as well-some component of her metabolic acidosis probably is related to AKI/kidney issues. In any event-she is still actively vomiting-and will continue with IV insulin -until she is symptomatically better-able to tolerate diet-and her anion gap closes. She normally takes 15 units of Lantus  twice daily and is on a sliding scale at home.  Prolonged QTc K/Mg appears stable Avoid QTc prolonging medications Telemetry monitoring Repeat twelve-lead EKG later today.  Addendum: Repeat twelve-lead EKG this morning-shows significant improvement in her QTc-Will go ahead and place her on  scheduled IV Reglan -continue telemetry monitoring.  Recheck twelve-lead EKG in AM.  HTN BP significantly elevated-if she is able to take oral meds (if not vomiting persistently) will place her back on Coreg /amlodipine  Otherwise we will use IV hydralazine /IV labetalol  as needed till her oral intake is better.  HLD Hold statins  History of PAD-s/p bilateral AKA Does not have prosthesis-uses a wheelchair mostly.  History of peripheral neuropathy Takes Neurontin -which will be held due to ongoing vomiting.   Advance Care Planning:   Code Status: Full Code   Consults: Renal  Family Communication: None at bedside  Severity of Illness: The appropriate patient status for this patient is INPATIENT. Inpatient status is judged to be reasonable and necessary in order to provide the required intensity of service to ensure the patient's safety. The patient's presenting symptoms, physical exam findings, and initial radiographic and laboratory data in the context of their chronic comorbidities is felt to place them at high risk for further clinical deterioration. Furthermore, it is not anticipated that the patient will be medically stable for discharge from the hospital within 2 midnights of admission.   * I certify that at the point of admission it is my clinical judgment that the patient will require inpatient hospital care spanning beyond 2 midnights from the point of admission due to high intensity of service, high risk for further deterioration and high frequency of surveillance required.*  Author: Donalda Applebaum, MD 03/24/2024 6:13 AM  For on call review [email or web address].      [1]  Allergies Allergen Reactions   Ibuprofen  Other (See Comments)    07/23/22 - Worsening kidney function after 3 doses of ibuprofen  400mg .  Creatinine increased from 1.3 to 2.  Creatinine returned to normal after stopping.   Lisinopril  Cough, Other (See Comments), Itching and Rash    Breakout in rash   "

## 2024-03-24 NOTE — ED Provider Notes (Signed)
 " [facility] AT [facility] Provider Note   CSN: [id number] Arrival date & time: 03/24/24  9875     Patient presents with: Abdominal Pain and Emesis   Kelli Hudson is a 34 y.o. female.   The history is provided by the patient.  Abdominal Pain Associated symptoms: vomiting   Emesis Associated symptoms: abdominal pain   Kelli Hudson is a 34 y.o. female who presents to the Emergency Department complaining of abdominal pain and vomiting.  She presents to the emergency department by EMS from home for evaluation of sudden and severe symptoms that started earlier this evening.  She reports severe generalized abdominal pain with numerous episodes of emesis.  No fever, diarrhea, cough, runny nose, dysuria.  She does have a history of gastroparesis and has had similar symptoms in the past.  She is compliant with her medications.  She states that her blood sugars have been running within the normal range.     Prior to Admission medications  Medication Sig Start Date End Date Taking? Authorizing Provider  diphenoxylate-atropine (LOMOTIL) 2.5-0.025 MG tablet SMARTSIG:1 Tablet(s) By Mouth 11/19/23  Yes [provider]  furosemide  (LASIX ) 20 MG tablet Take 20 mg by mouth daily as needed. 03/01/24  Yes [provider]  insulin  lispro (HUMALOG ) 100 UNIT/ML KwikPen PLEASE SEE ATTACHED FOR DETAILED DIRECTIONS 01/31/24  Yes [provider]  LANTUS  SOLOSTAR 100 UNIT/ML Solostar Pen PLEASE SEE ATTACHED FOR DETAILED DIRECTIONS 02/24/24  Yes [provider]  pantoprazole  (PROTONIX ) 40 MG tablet Take 40 mg by mouth every morning. 01/10/24  Yes [provider]  acetaminophen  (TYLENOL ) 500 MG tablet Take 1 tablet (500 mg total) by mouth every 6 (six) hours as needed. 04/04/23   Nivia Colon, PA-C  amLODipine  (NORVASC ) 10 MG tablet Take 1 tablet (10 mg total) by mouth daily. 12/04/22   Elgergawy, Brayton RAMAN, MD   amoxicillin -clavulanate (AUGMENTIN ) 875-125 MG tablet Take 1 tablet by mouth every 12 (twelve) hours. 07/08/23   Hildegard Loge, PA-C  ascorbic acid  (VITAMIN C ) 1000 MG tablet Take 1 tablet (1,000 mg total) by mouth daily. Patient taking differently: Take 1,000 mg by mouth once a week. 11/22/22   Lue Elsie BROCKS, MD  atorvastatin  (LIPITOR) 20 MG tablet Take 20 mg by mouth daily.    [provider]  benzonatate  (TESSALON ) 100 MG capsule Take 1 capsule (100 mg total) by mouth every 8 (eight) hours. 04/04/23   Nivia Colon, PA-C  carvedilol  (COREG ) 6.25 MG tablet Take 1 tablet (6.25 mg total) by mouth 2 (two) times daily with a meal. 12/03/22   Elgergawy, Brayton RAMAN, MD  Continuous Glucose Transmitter (DEXCOM G6 TRANSMITTER) MISC 1 Device by Does not apply route as directed. 06/18/22   Shamleffer, Ibtehal Jaralla, MD  etonogestrel  (NEXPLANON ) 68 MG IMPL implant 1 each by Subdermal route continuous.    [provider]  famotidine  (PEPCID ) 20 MG tablet Take 1 tablet (20 mg total) by mouth 2 (two) times daily. 12/03/22   Elgergawy, Brayton RAMAN, MD  gabapentin  (NEURONTIN ) 300 MG capsule Take 1 capsule (300 mg total) by mouth every 8 (eight) hours. 11/22/22   Lue Elsie BROCKS, MD  hydrOXYzine  (ATARAX ) 10 MG tablet Take 1 tablet (10 mg total) by mouth 3 (three) times daily as needed for anxiety. 03/07/23   Krishnan, Gokul, MD  insulin  aspart (NOVOLOG ) 100 UNIT/ML injection Inject 0-9 Units into the skin 3 (three) times daily with meals. Patient taking differently: Inject 10-30 Units into  the skin 3 (three) times daily with meals. 12/03/22   Elgergawy, Brayton RAMAN, MD  insulin  degludec (TRESIBA  FLEXTOUCH) 200 UNIT/ML FlexTouch Pen Inject 15 Units into the skin 2 (two) times daily. If CBG is > 150    [provider]  Insulin  Pen Needle 32G X 4 MM MISC 1 Device by Does not apply route in the morning, at noon, in the evening, and at bedtime. 06/18/22   Shamleffer, Donell Cardinal, MD  MAGNESIUM  PO  Take 1 tablet by mouth daily.    [provider]  methocarbamol  (ROBAXIN ) 500 MG tablet Take 500 mg by mouth 3 (three) times daily as needed for muscle spasms. 11/22/22   [provider]  metoCLOPramide  (REGLAN ) 10 MG tablet Take 1 tablet (10 mg total) by mouth every 6 (six) hours. Patient not taking: Reported on 03/10/2024 03/23/23   Mannie Pac T, DO  Multiple Vitamin (MULTIVITAMIN WITH MINERALS) TABS tablet Take 1 tablet by mouth daily. 11/22/22   Lue Elsie BROCKS, MD  oxyCODONE  (ROXICODONE ) 5 MG immediate release tablet Take 1 tablet (5 mg total) by mouth every 4 (four) hours as needed for severe pain (pain score 7-10). 07/08/23   Hildegard Loge, PA-C  potassium chloride  SA (KLOR-CON  M) 20 MEQ tablet Take 20 mEq by mouth daily.    [provider]  spironolactone (ALDACTONE) 25 MG tablet Take 25 mg by mouth 2 (two) times daily.    [provider]  traZODone  (DESYREL ) 150 MG tablet Take 150 mg by mouth at bedtime. 01/14/23   [provider]    Allergies: Ibuprofen  and Lisinopril     Review of Systems  Gastrointestinal:  Positive for abdominal pain and vomiting.  All other systems reviewed and are negative.   Updated Vital Signs BP (!) 166/106   Pulse (!) 119   Temp 98.7 F (37.1 C) (Oral)   Resp 16   Ht 5' (1.524 m)   Wt 59 kg   SpO2 99%   BMI 25.40 kg/m   Physical Exam Vitals and nursing note reviewed.  Constitutional:      Appearance: She is well-developed.     Comments: Appears uncomfortable  HENT:     Head: Normocephalic and atraumatic.  Cardiovascular:     Rate and Rhythm: Regular rhythm. Tachycardia present.  Pulmonary:     Effort: Pulmonary effort is normal.     Breath sounds: Normal breath sounds.  Abdominal:     Palpations: Abdomen is soft.     Tenderness: There is abdominal tenderness. There is no guarding or rebound.     Comments: Moderate generalized abdominal tenderness  Musculoskeletal:        General: No  tenderness.     Comments: Bilateral AKA  Skin:    General: Skin is warm and dry.  Neurological:     Mental Status: She is alert and oriented to person, place, and time.  Psychiatric:        Behavior: Behavior normal.     (all labs ordered are listed, but only abnormal results are displayed) Labs Reviewed  COMPREHENSIVE METABOLIC PANEL WITH GFR - Abnormal; Notable for the following components:      Result Value   CO2 16 (*)    Glucose, Bld 322 (*)    BUN 59 (*)    Creatinine, Ser 6.05 (*)    Albumin  3.1 (*)    Alkaline Phosphatase 128 (*)    GFR, Estimated 9 (*)    Anion gap 20 (*)  All other components within normal limits  CBC WITH DIFFERENTIAL/PLATELET - Abnormal; Notable for the following components:   WBC 13.5 (*)    Platelets 499 (*)    Neutro Abs 11.5 (*)    All other components within normal limits  BLOOD GAS, VENOUS - Abnormal; Notable for the following components:   pCO2, Ven 30 (*)    pO2, Ven 31 (*)    Bicarbonate 15.5 (*)    Acid-base deficit 9.4 (*)    All other components within normal limits  BASIC METABOLIC PANEL WITH GFR - Abnormal; Notable for the following components:   CO2 15 (*)    Glucose, Bld 307 (*)    BUN 58 (*)    Creatinine, Ser 6.12 (*)    GFR, Estimated 9 (*)    Anion gap 20 (*)    All other components within normal limits  BETA-HYDROXYBUTYRIC ACID - Abnormal; Notable for the following components:   Beta-Hydroxybutyric Acid 3.38 (*)    All other components within normal limits  MAGNESIUM  - Abnormal; Notable for the following components:   Magnesium  2.5 (*)    All other components within normal limits  I-STAT CHEM 8, ED - Abnormal; Notable for the following components:   Chloride 112 (*)    BUN 62 (*)    Creatinine, Ser 6.70 (*)    Glucose, Bld 306 (*)    TCO2 15 (*)    All other components within normal limits  CBG MONITORING, ED - Abnormal; Notable for the following components:   Glucose-Capillary 207 (*)    All other  components within normal limits  LIPASE, BLOOD  HCG, SERUM, QUALITATIVE  URINALYSIS, ROUTINE W REFLEX MICROSCOPIC  BASIC METABOLIC PANEL WITH GFR  BASIC METABOLIC PANEL WITH GFR  BASIC METABOLIC PANEL WITH GFR  BASIC METABOLIC PANEL WITH GFR  BETA-HYDROXYBUTYRIC ACID  BETA-HYDROXYBUTYRIC ACID  BETA-HYDROXYBUTYRIC ACID  BETA-HYDROXYBUTYRIC ACID  SODIUM, URINE, RANDOM  CREATININE, URINE, RANDOM  CBG MONITORING, ED    EKG: EKG Interpretation Date/Time:  Thursday March 24 2024 03:20:53 EST Ventricular Rate:  105 PR Interval:  135 QRS Duration:  92 QT Interval:  493 QTC Calculation: 652 R Axis:   67  Text Interpretation: Sinus tachycardia Consider right atrial enlargement Borderline repolarization abnormality Prolonged QT interval Confirmed by Griselda Norris [phone]) on 03/24/2024 3:51:35 AM  Radiology: CT ABDOMEN PELVIS WO CONTRAST Result Date: 03/24/2024 EXAM: CT ABDOMEN AND PELVIS WITHOUT CONTRAST 03/24/2024 04:17:00 AM TECHNIQUE: CT of the abdomen and pelvis was performed without the administration of intravenous contrast. Multiplanar reformatted images are provided for review. Automated exposure control, iterative reconstruction, and/or weight-based adjustment of the mA/kV was utilized to reduce the radiation dose to as low as reasonably achievable. COMPARISON: Multiple prior CTs back to 2017. The two most recent are both with contrast and dated 04/05/2023 and 03/24/2023. CLINICAL HISTORY: Abdominal pain, acute, nonlocalized. History of gastroparesis and acute pancreatitis. 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 Lung bases are clear. The cardiac size is normal. LIVER: The liver is unremarkable. GALLBLADDER AND BILE DUCTS: Gallbladder is unremarkable. No biliary ductal dilatation. SPLEEN: The unenhanced spleen is unremarkable. PANCREAS: Previously, there was peripancreatic edema and fluid on the prior study, which is  not seen today.The unenhanced pancreas is normal in size, contour, and attenuation. ADRENAL GLANDS: No adrenal mass. KIDNEYS, URETERS AND BLADDER: No contour-deforming mass in the unenhanced kidneys is seen. There is no urinary stone or obstruction. No hydronephrosis. No  perinephric or periureteral stranding. The bladder is unremarkable for its degree of distention. GI AND BOWEL: The stomach is partially contracted. There is a short linear metallic structure anterior to the distal body of stomach which appears to be the terminal part of a gastric stimulator apparatus, the remainder of which has been removed since the prior study including the implanted battery that was previously in the subcutaneous left mid abdominal wall and the attached wiring previously extending up to this metallic density. There is no small bowel dilatation or inflammation. The appendix is prominent, contains contrast and measures 9 mm distally, but there are no inflammatory changes adjacent to it. A similar appendiceal measurement was obtained previously. Today there are thickened folds in the cecum, ascending, descending and sigmoid colon, faint inflammatory reaction over portions. This was seen on the prior studies as well, and is most likely due to an inflammatory or infectious colitis. There is no pneumatosis or portal venous gas. PERITONEUM AND RETROPERITONEUM: No free fluid, free hemorrhage, or free air. No incarcerated hernia. VASCULATURE: Aorta is normal in caliber. Aortic, visceral, and iliofemoral branch vessel atherosclerosis is noted. No other significant vascular findings. This is age-advanced for a 34 year old. LYMPH NODES: No lymphadenopathy. REPRODUCTIVE ORGANS: The uterus and ovaries are unremarkable without contrast. The ovaries are normal in size. BONES AND SOFT TISSUES: No acute osseous abnormality. Subcutaneous scarring from gastric stimulator battery removal noted in the left mid-abdomen. Scattered pelvic phleboliths.  IMPRESSION: 1. Thickened folds in the cecum, ascending, descending, and sigmoid colon, consistent with inflammatory or infectious colitis. 2. Stable prominent appendix measuring 9 mm distally, without adjacent inflammatory change. 3. Age-advanced atherosclerosis of the aortic visceral and iliofemoral branch vessels. 4. Residual gastric stimulator component anterior to the distal gastric body with left mid abdominal wall postprocedural scarring. Electronically signed by: Francis Quam MD 03/24/2024 04:55 AM EST RP Workstation: [id number]   DG Chest Port 1 View Result Date: 03/24/2024 EXAM: 1 VIEW(S) XRAY OF THE CHEST 03/24/2024 04:05:00 AM COMPARISON: Portable chest 04/05/2023. CLINICAL HISTORY: CP/AP, ARF. Chest pain/abdominal pain, acute renal failure. FINDINGS: LUNGS AND PLEURA: The lungs are hyperexpanded. No focal pulmonary opacity. There is limited evaluation of the lung bases. Similar findings were noted previously. No pleural effusion. No pneumothorax. HEART AND MEDIASTINUM: No acute abnormality of the cardiac and mediastinal silhouettes. BONES AND SOFT TISSUES: No acute osseous abnormality. IMPRESSION: 1. No acute cardiopulmonary abnormality. 2. Limited evaluation of the lung bases. Electronically signed by: Francis Quam MD 03/24/2024 04:26 AM EST RP Workstation: [id number]     Procedures  CRITICAL CARE Performed by: Almarie Burner   Total critical care time: 40 minutes  Critical care time was exclusive of separately billable procedures and treating other patients.  Critical care was necessary to treat or prevent imminent or life-threatening deterioration.  Critical care was time spent personally by me on the following activities: development of treatment plan with patient and/or surrogate as well as nursing, discussions with consultants, evaluation of patient's response to treatment, examination of patient, obtaining history from patient or surrogate, ordering and performing treatments and  interventions, ordering and review of laboratory studies, ordering and review of radiographic studies, pulse oximetry and re-evaluation of patient's condition.  Medications Ordered in the ED  insulin  regular, human (MYXREDLIN ) 100 units/ 100 mL infusion (1 Units/hr Intravenous Rate/Dose Change 03/24/24 0504)  dextrose  50 % solution 0-50 mL (has no administration in time range)  0.9 %  sodium chloride  infusion ( Intravenous New Bag/Given 03/24/24 0351)  dextrose  5 % and  0.45 % NaCl infusion ( Intravenous New Bag/Given 03/24/24 0503)  acetaminophen  (TYLENOL ) tablet 650 mg (has no administration in time range)    Or  acetaminophen  (TYLENOL ) suppository 650 mg (has no administration in time range)  LORazepam  (ATIVAN ) injection 0.5 mg (has no administration in time range)  HYDROmorphone  (DILAUDID ) injection 0.5 mg (has no administration in time range)  sodium chloride  0.9 % bolus 1,000 mL (0 mLs Intravenous Stopped 03/24/24 0504)  metoCLOPramide  (REGLAN ) injection 10 mg (10 mg Intravenous Given 03/24/24 0251)  HYDROmorphone  (DILAUDID ) injection 0.5 mg (0.5 mg Intravenous Given 03/24/24 0252)  sodium chloride  0.9 % bolus 1,000 mL (0 mLs Intravenous Stopped 03/24/24 0504)  pantoprazole  (PROTONIX ) injection 40 mg (40 mg Intravenous Given 03/24/24 0348)  LORazepam  (ATIVAN ) injection 0.5 mg (0.5 mg Intravenous Given 03/24/24 0345)                                    Medical Decision Making Amount and/or Complexity of Data Reviewed Labs: ordered. Radiology: ordered.  Risk Prescription drug management. Decision regarding hospitalization.   Patient with history of type 1 diabetes, CKD, gastroparesis here for evaluation of abdominal pain, vomiting.  She appears dehydrated on examination, uncomfortable.  Labs significant for AKI, low bicarb and elevated anion gap.  Her pH is within normal limits but concern for DKA given her lab findings.  She was treated with IV fluids, started on glucose stabilizer.  She was  treated with antiemetic, pain medications.  Her QT was prolonged on EKG, will hold off on additional antiemetics, will provide lorazepam  as needed for nausea at this point.  Given severity of symptoms feel patient warrants admission given her acute kidney injury.  Medicine consulted for admission for ongoing care.  Patient updated of findings of studies and she is in agreement with admission for further care.     Final diagnoses:  Diabetic ketoacidosis without coma associated with type 1 diabetes mellitus (HCC)  AKI (acute kidney injury)  Generalized abdominal pain    ED Discharge Orders     None          Griselda Norris, MD 03/24/24 [phone]  "

## 2024-03-24 NOTE — Consult Note (Signed)
 Renal Service Consult Note [facility] Kelli JONETTA Fret, MD  Patient: Kelli Hudson Date: 03/24/2024 Requesting Physician: Dr. Raenelle  Reason for Consult: Renal failure HPI: The patient is a 34 y.o. year-old w/ PMH significant for CKD, DM1, DKA, gastroparesis, HTN, hyperthyroidism who presented to ED 2/05 early am c/o abd pain and N/V. In ED BP 172/110, HR 112, RR 18, 98% sats on RA. Lab showed K+ 4.2, bun 58, creatinine 6.12, CO2 16, AG 20, VBG 7.32/30. Alb 3.1, LFT's ok, WBC 13k, Hb 12.  BHB was 3.38 then 2.11. UA showed many bact, 6-10 rbc, >50 wbc. UNa 81, Ucreat 90. CXR showed no acute disease. CT abd/ pelv noncon showed residual gastric stimulator and some suggestions of colitis. Pt rec'd IV reglan , ativan , PPI, NS 125 cc/hr, IV rocephin , D51/2 NS at 125 /hr, also 2 L bolus NS 0.9%.  Pt admitted and treated w/ IV insulin  for DKA.  We are asked to see for renal failure.    Pt seen in ED room. Still feeling a bit weak and nauseous.  No SOB. Hx of bilat LE amputation. Is f/b Dr Melia at [facility] for CKD.  Denies any nsaids. Needs to void.    ROS - denies CP, no joint pain, no HA, no blurry vision, no rash   Past Medical History  Past Medical History:  Diagnosis Date   Acute H. pylori gastric ulcer    Chronic kidney disease    Coffee ground emesis    Diabetes mellitus (HCC)    Diabetic gastroparesis (HCC)    DKA (diabetic ketoacidosis) (HCC) 02/24/2021   Gastroparesis    GERD (gastroesophageal reflux disease)    Hypertension    Hyperthyroidism    Intractable nausea and vomiting 04/20/2021   Normocytic anemia 04/20/2020   Prolonged Q-T interval on ECG    Past Surgical History  Past Surgical History:  Procedure Laterality Date   AMPUTATION Bilateral 10/24/2022   Procedure: BILATERAL BELOW KNEE AMPUTATION;  Surgeon: Harden Jerona GAILS, MD;  Location: [facility] OR;  Service: Orthopedics;  Laterality: Bilateral;   AMPUTATION Bilateral 10/08/2022   Procedure: BILATERAL  LEG DEBRIDEMENT;  Surgeon: Harden Jerona GAILS, MD;  Location[facility] OR;  Service: Orthopedics;  Laterality: Bilateral;   AMPUTATION Bilateral 11/14/2022   Procedure: BILATERAL ABOVE KNEE AMPUTATION;  Surgeon: Harden Jerona GAILS, MD;  Location: [facility] OR;  Service: Orthopedics;  Laterality: Bilateral;   AMPUTATION TOE Left 03/10/2018   Procedure: AMPUTATION FIFTH TOE;  Surgeon: Gershon Donnice SAUNDERS, DPM;  Location: [facility];  Service: Podiatry;  Laterality: Left;   APPLICATION OF WOUND VAC Bilateral 10/12/2022   Procedure: APPLICATION OF WOUND VAC BILATERAL LEGS;  Surgeon: Celena Sharper, MD;  Location: [facility] OR;  Service: Orthopedics;  Laterality: Bilateral;   APPLICATION OF WOUND VAC Bilateral 10/24/2022   Procedure: APPLICATION OF WOUND VAC;  Surgeon: Harden Jerona GAILS, MD;  Location: [facility] OR;  Service: Orthopedics;  Laterality: Bilateral;   BIOPSY  01/28/2020   Procedure: BIOPSY;  Surgeon: Elicia Claw, MD;  Location: [facility];  Service: Gastroenterology;;   BOTOX  INJECTION  08/26/2021   Procedure: BOTOX  INJECTION;  Surgeon: Legrand Victory LITTIE DOUGLAS, MD;  Location: [facility];  Service: Gastroenterology;;   ESOPHAGOGASTRODUODENOSCOPY N/A 01/28/2020   Procedure: ESOPHAGOGASTRODUODENOSCOPY (EGD);  Surgeon: Elicia Claw, MD;  Location: [facility];  Service: Gastroenterology;  Laterality: N/A;   ESOPHAGOGASTRODUODENOSCOPY (EGD) WITH PROPOFOL  Left 09/08/2015   Procedure: ESOPHAGOGASTRODUODENOSCOPY (EGD) WITH PROPOFOL ;  Surgeon: Elsie Cree, MD;  Location: [facility];  Service: Endoscopy;  Laterality: Left;   ESOPHAGOGASTRODUODENOSCOPY (EGD) WITH PROPOFOL  N/A 08/26/2021   Procedure: ESOPHAGOGASTRODUODENOSCOPY (EGD) WITH PROPOFOL ;  Surgeon: Legrand Victory LITTIE DOUGLAS, MD;  Location: [facility];  Service: Gastroenterology;  Laterality: N/A;   GASTRIC STIMULATOR IMPLANT SURGERY  06/2022   at [facility]   I & D EXTREMITY Bilateral 10/12/2022   Procedure: IRRIGATION AND  DEBRIDEMENT  BILATERAL LEGS;  Surgeon:  Celena Sharper, MD;  Location: [facility] OR;  Service: Orthopedics;  Laterality: Bilateral;   I & D EXTREMITY Right 10/13/2022   Procedure: RIGHT THIGH WOUND VAC EXCHANGE;  Surgeon: Kendal Franky SQUIBB, MD;  Location: [facility];  Service: Orthopedics;  Laterality: Right;   I & D EXTREMITY Bilateral 10/17/2022   Procedure: BILATERAL THIGH AND LEG DEBRIDEMENT, PARTIAL BILATERAL FOOT AMPUTATION;  Surgeon: Harden Jerona GAILS, MD;  Location: [facility];  Service: Orthopedics;  Laterality: Bilateral;   I & D EXTREMITY Bilateral 11/05/2022   Procedure: BILATERAL THIGH DEBRIDEMENT;  Surgeon: Harden Jerona GAILS, MD;  Location: [facility] OR;  Service: Orthopedics;  Laterality: Bilateral;   I & D EXTREMITY Bilateral 11/07/2022   Procedure: BILATERAL THIGH AND LEG DEBRIDEMENT;  Surgeon: Harden Jerona GAILS, MD;  Location: [facility] OR;  Service: Orthopedics;  Laterality: Bilateral;   I & D EXTREMITY Bilateral 11/12/2022   Procedure: BILATERAL LEG DEBRIDEMENTS;  Surgeon: Harden Jerona GAILS, MD;  Location: [facility] OR;  Service: Orthopedics;  Laterality: Bilateral;   IRRIGATION AND DEBRIDEMENT FOOT Bilateral 10/05/2022   Procedure: IRRIGATION AND DEBRIDEMENT FOOT;  Surgeon: Janit Thresa HERO, DPM;  Location: [facility];  Service: Orthopedics/Podiatry;  Laterality: Bilateral;   WISDOM TOOTH EXTRACTION     Family History  Family History  Problem Relation Age of Onset   Lung cancer Mother    Diabetes Mother    Bipolar disorder Father    Liver cancer Maternal Grandfather    Diabetes Maternal Aunt        x 2   Pancreatic cancer Maternal Uncle    Prostate cancer Maternal Uncle    Breast cancer Other        maternal great aunt   Heart disease Other    Ovarian cancer Other        maternal great aunt   Kidney disease Other        maternal great aunt   Colon cancer Neg Hx    Stomach cancer Neg Hx    Social History  reports that she has never smoked. She has never used smokeless tobacco. She reports current alcohol use of about 1.0 standard drink of alcohol per week. She  reports that she does not use drugs. Allergies Allergies[1] Home medications Prior to Admission medications  Medication Sig Start Date End Date Taking? Authorizing Provider  acetaminophen  (TYLENOL ) 500 MG tablet Take 1 tablet (500 mg total) by mouth every 6 (six) hours as needed. Patient taking differently: Take 500 mg by mouth every 6 (six) hours as needed for mild pain (pain score 1-3). 04/04/23  Yes Nivia Colon, PA-C  amLODipine  (NORVASC ) 10 MG tablet Take 1 tablet (10 mg total) by mouth daily. 12/04/22  Yes Elgergawy, Brayton RAMAN, MD  atorvastatin  (LIPITOR) 20 MG tablet Take 20 mg by mouth daily.   Yes [provider]  carvedilol  (COREG ) 12.5 MG tablet Take 12.5 mg by mouth 2 (two) times daily. 12/04/23  Yes [provider]  etonogestrel  (NEXPLANON ) 68 MG IMPL implant 1 each by Subdermal route continuous.   Yes [provider]  gabapentin  (NEURONTIN ) 300 MG  capsule Take 1 capsule (300 mg total) by mouth every 8 (eight) hours. 11/22/22  Yes Lue Elsie BROCKS, MD  insulin  aspart (NOVOLOG ) 100 UNIT/ML injection Inject 0-9 Units into the skin 3 (three) times daily with meals. Patient taking differently: Inject 10-30 Units into the skin 3 (three) times daily with meals. 12/03/22  Yes Elgergawy, Brayton RAMAN, MD  insulin  degludec (TRESIBA  FLEXTOUCH) 200 UNIT/ML FlexTouch Pen Inject 15 Units into the skin 2 (two) times daily. If CBG is > 150   Yes [provider]  insulin  lispro (HUMALOG ) 100 UNIT/ML KwikPen Inject 2-16 Units into the skin in the morning, at noon, and at bedtime. 01/31/24  Yes [provider]  LANTUS  SOLOSTAR 100 UNIT/ML Solostar Pen Inject 20 Units into the skin in the morning and at bedtime. 02/24/24  Yes [provider]  methocarbamol  (ROBAXIN ) 500 MG tablet Take 500 mg by mouth 3 (three) times daily as needed for muscle spasms. 11/22/22  Yes [provider]  metoCLOPramide  (REGLAN ) 10 MG tablet Take 1 tablet (10 mg total) by  mouth every 6 (six) hours. 03/23/23  Yes Mannie Pac T, DO  Multiple Vitamin (MULTIVITAMIN WITH MINERALS) TABS tablet Take 1 tablet by mouth daily. 11/22/22  Yes Lue Elsie BROCKS, MD  pantoprazole  (PROTONIX ) 40 MG tablet Take 40 mg by mouth every morning. 01/10/24  Yes [provider]  sodium bicarbonate  650 MG tablet Take 650 mg by mouth 2 (two) times daily. 12/24/23  Yes [provider]  traZODone  (DESYREL ) 150 MG tablet Take 150 mg by mouth at bedtime. 01/14/23  Yes [provider]  amoxicillin -clavulanate (AUGMENTIN ) 875-125 MG tablet Take 1 tablet by mouth every 12 (twelve) hours. Patient not taking: Reported on 03/24/2024 07/08/23   Hildegard Loge, PA-C  ascorbic acid  (VITAMIN C ) 1000 MG tablet Take 1 tablet (1,000 mg total) by mouth daily. Patient not taking: Reported on 03/24/2024 11/22/22   Lue Elsie BROCKS, MD  benzonatate  (TESSALON ) 100 MG capsule Take 1 capsule (100 mg total) by mouth every 8 (eight) hours. Patient not taking: Reported on 03/24/2024 04/04/23   Nivia Colon, PA-C  carvedilol  (COREG ) 6.25 MG tablet Take 1 tablet (6.25 mg total) by mouth 2 (two) times daily with a meal. Patient not taking: Reported on 03/24/2024 12/03/22   Elgergawy, Brayton RAMAN, MD  Continuous Glucose Transmitter (DEXCOM G6 TRANSMITTER) MISC 1 Device by Does not apply route as directed. 06/18/22   Shamleffer, Ibtehal Jaralla, MD  diphenoxylate-atropine (LOMOTIL) 2.5-0.025 MG tablet SMARTSIG:1 Tablet(s) By Mouth Patient not taking: Reported on 03/24/2024 11/19/23   [provider]  famotidine  (PEPCID ) 20 MG tablet Take 1 tablet (20 mg total) by mouth 2 (two) times daily. Patient not taking: Reported on 03/24/2024 12/03/22   Elgergawy, Brayton RAMAN, MD  furosemide  (LASIX ) 20 MG tablet Take 20 mg by mouth daily as needed for fluid. 03/01/24   [provider]  hydrOXYzine  (ATARAX ) 10 MG tablet Take 1 tablet (10 mg total) by mouth 3 (three) times daily as needed for anxiety. Patient not  taking: Reported on 03/24/2024 03/07/23   Krishnan, Gokul, MD  Insulin  Pen Needle 32G X 4 MM MISC 1 Device by Does not apply route in the morning, at noon, in the evening, and at bedtime. 06/18/22   Shamleffer, Donell Cardinal, MD  MAGNESIUM  PO Take 1 tablet by mouth daily. Patient not taking: Reported on 03/24/2024    [provider]  oxyCODONE  (ROXICODONE ) 5 MG immediate release tablet Take 1 tablet (5 mg total) by  mouth every 4 (four) hours as needed for severe pain (pain score 7-10). Patient not taking: Reported on 03/24/2024 07/08/23   Hildegard Loge, PA-C  potassium chloride  SA (KLOR-CON  M) 20 MEQ tablet Take 20 mEq by mouth daily. Patient not taking: Reported on 03/24/2024    [provider]  spironolactone (ALDACTONE) 25 MG tablet Take 25 mg by mouth 2 (two) times daily. Patient not taking: Reported on 03/24/2024    [provider]     Vitals:   03/24/24 0600 03/24/24 1034 03/24/24 1037 03/24/24 1410  BP: (!) 159/101 (!) 178/111  (!) 163/95  Pulse: (!) 117 (!) 114  (!) 122  Resp: 14 12  14   Temp: 98.6 F (37 C)  98.5 F (36.9 C) 98.6 F (37 C)  TempSrc:   Oral   SpO2: 98% 98%  98%  Weight:      Height:       Exam Gen alert, no distress Sclera anicteric, throat clear  No jvd or bruits Chest clear bilat to bases RRR no MRG Abd soft ntnd no mass or ascites +bs Ext bilat LE amputation, no LE or UE edema Neuro is alert, Ox 3 , nf   Home bp meds: Norvasc  Coreg  Lasix  Aldactone   Date   Creat  eGFR   AKI peak Cr  Sept - oct 2024 2.54 >> 0.83  Jan 2025  2.63 >> 1.03 Apr 2023  1.39- 1.82 38- 52 ml/min  May 2025  2.44  26 ml/min   03/24/24  6.70, 5.92 9, 9  UA - rbcs 6-10, wbc >50, prot > 300  UNa 81, UCr 90  Renal US : pend CXR - no acute changes  Assessment/ Plan:  # AKI on CKD 3a - b/l creatinine 1.3- 1.8 from feb 2025, eGFR 38-52 ml/min - creatinine here 6.7 on admission in setting of DKA and recent N/V - AKI most likely due to volume depletion  related to DKA w/ N/V - no hypotension, no acei/ARB at home  # vol depletion - no vol excess on exam - tolerating IVFs at 250 cc/hr total now - voiding this afternoon - cont IVFs at around 150- 250 cc/hr total   # mild hypernatremia - will change both IVFs to 1/2 NS%   # DKA/ DM1 - per pmd        Myer Fret  MD [facility] 03/24/2024, 5:14 PM  Recent Labs  Lab 03/24/24 0420 03/24/24 0936  CREATININE 6.12* 5.92*  K 4.2 4.2   Inpatient medications:  carvedilol   6.25 mg Oral BID WC   heparin   5,000 Units Subcutaneous Q8H   metoCLOPramide  (REGLAN ) injection  10 mg Intravenous TID AC   [START ON 03/25/2024] pantoprazole  (PROTONIX ) IV  40 mg Intravenous Q24H    sodium chloride  125 mL/hr at 03/24/24 1442   cefTRIAXone  (ROCEPHIN )  IV Stopped (03/24/24 0714)   dextrose  5 % and 0.45 % NaCl 125 mL/hr at 03/24/24 1442   insulin  1.9 Units/hr (03/24/24 1616)   metronidazole  Stopped (03/24/24 0921)   acetaminophen  **OR** acetaminophen , albuterol , dextrose , hydrALAZINE , HYDROmorphone  (DILAUDID ) injection, labetalol , LORazepam , polyethylene glycol, trimethobenzamide       [1]  Allergies Allergen Reactions   Ibuprofen  Other (See Comments)    07/23/22 - Worsening kidney function after 3 doses of ibuprofen  400mg .  Creatinine increased from 1.3 to 2.  Creatinine returned to normal after stopping.   Lisinopril  Cough, Other (See Comments), Itching and Rash    Breakout in rash

## 2024-03-24 NOTE — ED Triage Notes (Signed)
 Pt BIBA from home, c/o nausea and vomiting, abdominal pain started tonight.  Pt noticed red tinged in vomit and has not eaten anything red. Hx of gastro paresis.   BP 172/110 HR 112 RR 18 O2 98 RA CBG 337

## 2024-03-24 NOTE — Progress Notes (Signed)
" °  Carryover admission to the Day Admitter.  I discussed this case with the EDP, Dr. Griselda.  Per these discussions:   This is a 34 year old female with history of type 1 diabetes mellitus complicated by diabetic gastroparesis, CKD stage IV with baseline creatinine between 1.5-2.5, who is being admitted to [facility] for acute renal failure superimposed on CKD stage IV, along with potential DKA after presenting to [facility] ED with 3 to 4 days of persistent nausea/vomiting associated with nonbloody, nonbilious emesis, as well as abdominal discomfort, and very limited oral intake over that timeframe.   In the setting of her CKD stage IV, she follows with [facility], and has never required initiation of dialysis.  While she does not remember specifically when her most recent prior serum creatinine was performed, she notes that that occurred sometime in 2025.  Per chart review, most recent prior creatinine data point available is found to be 2.44 on 07/08/2023 prior to that, creatinine was noted to be around 1.5.   This evening she denies any recent shortness of breath, cough, chest pain.  She conveys that she continues to produce urine, denies any recent dysuria.  Per my discussions with the EDP, the patient specifically denies any recent hematemesis.   In the ED this evening, she is noted to be afebrile, with oxygen saturations in the high 90s to 100% on room air.   CMP demonstrates potassium 4.2, bicarbonate 16, anion gap 20, creatinine 6.05, glucose 322, with liver enzymes within normal limits.  Lipase 17.  Beta-hydroxybutyrate acid level is pending.  VBG notable for 7.3 2/30/31/15.5.   Urinalysis has been ordered, with result currently pending.  EDP is ordered chest x-ray as well as CT abdomen/pelvis, with results of the studies currently pending.  Started on insulin  drip via Endo tool for potential component of DKA mag level currently pending.  EKG was reported to show borderline QTc  prolongation.   I have placed an order for inpatient admission to PCU bed at [facility] for further evaluation management of acute renal failure superimposed on CKD stage IV along with suspicion for an element of DKA after presenting with tractable nausea/vomiting as well as abdominal discomfort.   I have placed some additional preliminary admit orders via the adult multi-morbid admission order set. I have also ordered continuation of insulin  drip via Endo tool with existing IV fluid orders that included normal saline at 125 cc/h with instructions to transition to D5 half NS when CBG is less than 250.  Refrain from potassium supplementation at this time in the context of patient's acute renal failure.  I have added orders for every hour CBG monitoring and if continued existing orders for every 4 hour BMP monitoring as well as every 4 hour beta-hydroxybutyrate acid monitoring.  Regarding her acute renal failure superimposed on CKD stage IV, urinalysis is currently pending.  I have added on random urine sodium and random urine creatinine.  Monitoring for results of CT abdomen/pelvis, including evaluating for evidence of postrenal obstruction.  Regarding her recurrent nausea/vomiting with borderline QT prolongation, have ordered as needed IV Ativan .   Regarding her abdominal discomfort, with nonelevated lipase, no evidence of acute transaminitis and CT Abdo/pelvis currently pending, I have ordered Dilaudid  0.5 mg IV every 2 hours as needed for pain, x 2 doses, and of also ordered incentive spirometry.    Eva Pore, DO Hospitalist  "

## 2024-03-25 ENCOUNTER — Inpatient Hospital Stay (HOSPITAL_COMMUNITY): Admission: RE | Admit: 2024-03-25 | Source: Ambulatory Visit

## 2024-03-25 LAB — BASIC METABOLIC PANEL WITH GFR
Anion gap: 13 (ref 5–15)
Anion gap: 14 (ref 5–15)
Anion gap: 11 (ref 5–15)
BUN: 53 mg/dL — ABNORMAL HIGH (ref 6–20)
BUN: 48 mg/dL — ABNORMAL HIGH (ref 6–20)
BUN: 47 mg/dL — ABNORMAL HIGH (ref 6–20)
CO2: 21 mmol/L — ABNORMAL LOW (ref 22–32)
CO2: 18 mmol/L — ABNORMAL LOW (ref 22–32)
CO2: 19 mmol/L — ABNORMAL LOW (ref 22–32)
Calcium: 9.2 mg/dL (ref 8.9–10.3)
Calcium: 8.8 mg/dL — ABNORMAL LOW (ref 8.9–10.3)
Calcium: 8.7 mg/dL — ABNORMAL LOW (ref 8.9–10.3)
Chloride: 111 mmol/L (ref 98–111)
Chloride: 109 mmol/L (ref 98–111)
Chloride: 111 mmol/L (ref 98–111)
Creatinine, Ser: 5.47 mg/dL — ABNORMAL HIGH (ref 0.44–1.00)
Creatinine, Ser: 5.27 mg/dL — ABNORMAL HIGH (ref 0.44–1.00)
Creatinine, Ser: 5.2 mg/dL — ABNORMAL HIGH (ref 0.44–1.00)
GFR, Estimated: 10 mL/min — ABNORMAL LOW
GFR, Estimated: 10 mL/min — ABNORMAL LOW
GFR, Estimated: 11 mL/min — ABNORMAL LOW
Glucose, Bld: 196 mg/dL — ABNORMAL HIGH (ref 70–99)
Glucose, Bld: 165 mg/dL — ABNORMAL HIGH (ref 70–99)
Glucose, Bld: 186 mg/dL — ABNORMAL HIGH (ref 70–99)
Potassium: 3.9 mmol/L (ref 3.5–5.1)
Potassium: 3.7 mmol/L (ref 3.5–5.1)
Potassium: 3.6 mmol/L (ref 3.5–5.1)
Sodium: 144 mmol/L (ref 135–145)
Sodium: 141 mmol/L (ref 135–145)
Sodium: 140 mmol/L (ref 135–145)

## 2024-03-25 LAB — COMPREHENSIVE METABOLIC PANEL WITH GFR
ALT: 17 U/L (ref 0–44)
AST: 23 U/L (ref 15–41)
Albumin: 2.8 g/dL — ABNORMAL LOW (ref 3.5–5.0)
Alkaline Phosphatase: 103 U/L (ref 38–126)
Anion gap: 12 (ref 5–15)
BUN: 51 mg/dL — ABNORMAL HIGH (ref 6–20)
CO2: 20 mmol/L — ABNORMAL LOW (ref 22–32)
Calcium: 9 mg/dL (ref 8.9–10.3)
Chloride: 111 mmol/L (ref 98–111)
Creatinine, Ser: 5.53 mg/dL — ABNORMAL HIGH (ref 0.44–1.00)
GFR, Estimated: 10 mL/min — ABNORMAL LOW
Glucose, Bld: 198 mg/dL — ABNORMAL HIGH (ref 70–99)
Potassium: 3.7 mmol/L (ref 3.5–5.1)
Sodium: 143 mmol/L (ref 135–145)
Total Bilirubin: <0.2 mg/dL (ref 0.0–1.2)
Total Protein: 6.2 g/dL — ABNORMAL LOW (ref 6.5–8.1)

## 2024-03-25 LAB — CBC
HCT: 35.4 % — ABNORMAL LOW (ref 36.0–46.0)
Hemoglobin: 10.7 g/dL — ABNORMAL LOW (ref 12.0–15.0)
MCH: 29.2 pg (ref 26.0–34.0)
MCHC: 30.2 g/dL (ref 30.0–36.0)
MCV: 96.5 fL (ref 80.0–100.0)
Platelets: 465 10*3/uL — ABNORMAL HIGH (ref 150–400)
RBC: 3.67 MIL/uL — ABNORMAL LOW (ref 3.87–5.11)
RDW: 14.5 % (ref 11.5–15.5)
WBC: 17.2 10*3/uL — ABNORMAL HIGH (ref 4.0–10.5)
nRBC: 0 % (ref 0.0–0.2)

## 2024-03-25 LAB — GLUCOSE, CAPILLARY
Glucose-Capillary: 147 mg/dL — ABNORMAL HIGH (ref 70–99)
Glucose-Capillary: 90 mg/dL (ref 70–99)

## 2024-03-25 LAB — CBG MONITORING, ED
Glucose-Capillary: 133 mg/dL — ABNORMAL HIGH (ref 70–99)
Glucose-Capillary: 136 mg/dL — ABNORMAL HIGH (ref 70–99)
Glucose-Capillary: 154 mg/dL — ABNORMAL HIGH (ref 70–99)
Glucose-Capillary: 162 mg/dL — ABNORMAL HIGH (ref 70–99)
Glucose-Capillary: 163 mg/dL — ABNORMAL HIGH (ref 70–99)
Glucose-Capillary: 164 mg/dL — ABNORMAL HIGH (ref 70–99)
Glucose-Capillary: 169 mg/dL — ABNORMAL HIGH (ref 70–99)
Glucose-Capillary: 172 mg/dL — ABNORMAL HIGH (ref 70–99)
Glucose-Capillary: 173 mg/dL — ABNORMAL HIGH (ref 70–99)

## 2024-03-25 LAB — BETA-HYDROXYBUTYRIC ACID
Beta-Hydroxybutyric Acid: 0.24 mmol/L (ref 0.05–0.27)
Beta-Hydroxybutyric Acid: 0.15 mmol/L (ref 0.05–0.27)
Beta-Hydroxybutyric Acid: 0.05 mmol/L (ref 0.05–0.27)
Beta-Hydroxybutyric Acid: 0.15 mmol/L (ref 0.05–0.27)

## 2024-03-25 LAB — MAGNESIUM: Magnesium: 2.4 mg/dL (ref 1.7–2.4)

## 2024-03-25 MED ORDER — INSULIN GLARGINE-YFGN 100 UNIT/ML ~~LOC~~ SOLN
15.0000 [IU] | Freq: Once | SUBCUTANEOUS | Status: AC
  Administered 2024-03-25: 15 [IU] via SUBCUTANEOUS
  Filled 2024-03-25: qty 0.15

## 2024-03-25 MED ORDER — SODIUM CHLORIDE 0.9 % IV SOLN: INTRAVENOUS | Status: DC

## 2024-03-25 MED ORDER — SODIUM CHLORIDE 0.45 % IV SOLN: INTRAVENOUS | Status: AC

## 2024-03-25 MED ORDER — INSULIN ASPART 100 UNIT/ML IJ SOLN: 0.0000 [IU] | Freq: Every day | INTRAMUSCULAR | Status: AC

## 2024-03-25 MED ORDER — INSULIN ASPART 100 UNIT/ML IJ SOLN
0.0000 [IU] | Freq: Three times a day (TID) | INTRAMUSCULAR | Status: AC
  Administered 2024-03-25 (×2): 1 [IU] via SUBCUTANEOUS
  Administered 2024-03-25: 2 [IU] via SUBCUTANEOUS
  Filled 2024-03-25: qty 2
  Filled 2024-03-25 (×2): qty 1

## 2024-03-25 MED ORDER — DEXTROSE 5 % IV SOLN: INTRAVENOUS | Status: AC

## 2024-03-25 NOTE — Progress Notes (Signed)
 [facility] Progress Note  Subjective:  Seen in room No SOB No UOP recorded, but pt is voiding  Creat down to 5.2, AG down to 11 Serum CO2 up to 19, glucose 170- 200  Presentation summary: 34 y.o. year-old w/ PMH significant for CKD, DM1, DKA, gastroparesis, HTN, hyperthyroidism who presented to ED 2/05 early am c/o abd pain and N/V. In ED BP 172/110, HR 112, RR 18, 98% sats on RA. Lab showed K+ 4.2, bun 58, creatinine 6.12, CO2 16, AG 20, VBG 7.32/30. Alb 3.1, LFT's ok, WBC 13k, Hb 12.  BHB was 3.38 then 2.11. UA showed many bact, 6-10 rbc, >50 wbc. UNa 81, Ucreat 90. CXR showed no acute disease. CT abd/ pelv noncon showed residual gastric stimulator and some suggestions of colitis. Pt rec'd IV reglan , ativan , PPI, NS 125 cc/hr, IV rocephin , D51/2 NS at 125 /hr, also 2 L bolus NS 0.9%.  Pt admitted and treated w/ IV insulin  for DKA.  We are asked to see for renal failure.  Pt seen in ED room. Still feeling a bit weak and nauseous.  No SOB. Hx of bilat LE amputation. Is f/b Dr Melia at [facility] for CKD.  Denies any nsaids. Needs to void.   Vitals:   03/25/24 0726 03/25/24 0745 03/25/24 1115 03/25/24 1510  BP:  (!) 147/94 135/80 (!) 146/86  Pulse:  (!) 109 94 96  Resp:  18 16 16   Temp: 98.3 F (36.8 C)  98.6 F (37 C) 97.7 F (36.5 C)  TempSrc: Oral   Oral  SpO2:  98% 98% 98%  Weight:      Height:        Exam: Gen alert, no distress, on RA, tired Sclera anicteric, throat clear  No jvd or bruits Chest clear bilat to bases RRR no MRG Abd soft ntnd no mass or ascites +bs Ext bilat LE amputation, no LE or UE edema Neuro is alert, Ox 3 , nf     Home bp meds: Norvasc  Coreg  Lasix  Aldactone     Date                             Creat               eGFR                           AKI peak Cr      Sept - oct 2024           2.54 >> 0.83     Jan 2025                     2.63 >> 1.03 Apr 2023                     1.39- 1.82        38- 52 ml/min  May 2025                     2.44                 26 ml/min                     03/24/24                        6.70, 5.92  9, 9   UA - rbcs 6-10, wbc >50, prot > 300  UNa 81, UCr 90  CT abd/ pelv noncont 2/05 -> KIDNEYS, URETERS AND BLADDER: No mass in the unenhanced kidneys is seen, no urinary stone or obstruction, and no hydronephrosis bilaterally. No perinephric or periureteral stranding. Bladder is unremarkable for its degree of distention. CXR - no acute changes   Assessment/ Plan:   # AKI on CKD 3a - b/l creatinine 1.3- 1.8 from feb 2025, eGFR 38-52 ml/min - creatinine here 6.7 on admission in setting of DKA and recent N/V - CT abd shows normal appearing kidneys w/o obstruction - UA w/ proteinuria (chronic) and pyuria, urine lytes c/w ATN/ CKD - AKI due to volume depletion related to DKA w/ N/V - creat down to 5.2 this am, making urine (but in ED bed still so not recording) - no uremic findings  - cont IVFs w/ 1/2 NS at 125 cc/hr - if needed for BS support, I wrote for D5W at 65/hr - f/u labs in am    # vol depletion - no vol excess on exam - voiding this afternoon - cont IVFs      # mild hypernatremia - better, Na+ 140 today     # DKA/ DM1 - off IV insulin  for now  - per pmd     Myer Fret MD  [facility] 03/25/2024, 3:21 PM  Recent Labs  Lab 03/24/24 0241 03/24/24 0301 03/24/24 0936 03/24/24 1823 03/25/24 0332 03/25/24 0641 03/25/24 1153  HGB 12.0   < > 10.6*  --  10.7*  --   --   ALBUMIN  3.1*  --   --   --  2.8*  --   --   CALCIUM  9.7   < > 9.2   < > 9.0 8.8* 8.7*  CREATININE 6.05*   < > 5.92*   < > 5.53* 5.27* 5.20*  K 4.2   < > 4.2   < > 3.7 3.7 3.6   < > = values in this interval not displayed.   No results for input(s): IRON , TIBC, FERRITIN in the last 168 hours. Inpatient medications:  carvedilol   6.25 mg Oral BID WC   heparin   5,000 Units Subcutaneous Q8H   insulin  aspart  0-5 Units Subcutaneous QHS   insulin  aspart  0-9 Units Subcutaneous TID WC   metoCLOPramide   (REGLAN ) injection  10 mg Intravenous TID AC   pantoprazole  (PROTONIX ) IV  40 mg Intravenous Q24H    sodium chloride  Stopped (03/24/24 2017)   sodium chloride  100 mL/hr at 03/25/24 9366   cefTRIAXone  (ROCEPHIN )  IV Stopped (03/25/24 0659)   dextrose  5 % and 0.45 % NaCl Stopped (03/25/24 0630)   metronidazole  Stopped (03/25/24 0850)   acetaminophen  **OR** acetaminophen , albuterol , dextrose , hydrALAZINE , HYDROmorphone  (DILAUDID ) injection, labetalol , LORazepam , polyethylene glycol, trimethobenzamide 

## 2024-03-25 NOTE — ED Notes (Signed)
 Assisted patient to restroom.

## 2024-03-25 NOTE — ED Notes (Signed)
Provided patient with breakfast tray.

## 2024-03-25 NOTE — Progress Notes (Signed)
 " PROGRESS NOTE    Kelli Hudson  FMW:[id number] DOB: 06-Apr-1990 DOA: 03/24/2024 PCP: Arloa Jarvis, NP  HPI: Kelli Hudson is a 34 y.o. female with medical history significant of DM-1, HTN, CKD 4, PAD-s/p bilateral AKA, diabetic gastroparesis-s/p gastric stimulator implant 2024 who presented with nausea, vomiting and abdominal pain x 1 day.   Per patient-she was in her usual state of health-yesterday afternoon she suddenly had numerous episodes of nonbloody-and nonbilious vomiting.  She had no diarrhea.  She then started having diffuse abdominal pain but predominantly in her mid to lower abdomen.  Pain was 10/10-without any aggravating or relieving factors-no radiation.  She denies any fever.  She denies any URI symptoms.  She denies any sick contacts.   She subsequently presented to the ED where she was found to have AKI, elevated blood sugars-with an elevated anion gap and metabolic acidosis.  She was thought to have some component of DKA-started on insulin  infusion-she subsequently underwent CT imaging which showed colitis.  Hospitalist service was then asked to admit this patient for further evaluation and treatment.   Subjective: Patient states she feels better today.  No more of vomiting and nausea is improved.  Patient states she is compliant with her medicines but wonders whether she was taking the wrong pills, specifically not the Reglan , because she says her vision is very limited.  Urine output is decreased from baseline.    Hospital Course: No notes on file   Assessment and Plan:  Colitis CT scan does show thickening of cecum/ascending/descending and sigmoid colon colon-she does not have diarrhea but vomiting Start Rocephin /Flagyl  If diarrhea recurs-Will send out stool studies.   Probably gastroparesis flare Likely secondary to above QTc significantly prolonged-hold Reglan -repeat EKG in a few hours-and use Reglan  sparingly She does have a  gastric stimulator in place-and takes Reglan  multiple times a day at home.   AKI on CKD 4 - Appreciate Dr.  Geralynn opinion Suspect this is hemodynamically mediated due to vomiting/colitis CT abdomen negative for any hydronephrosis/obstruction UA currently pending She denies taking NSAIDs or ACEI/ARB. No indications for HD at this point but will need to be watched closely    Probable DKA-history of DM-1 Does have labs consistent with DKA-however her blood sugars are not terribly elevated-however she is a type I diabetic-and is at risk for DKA.  Elevated ketones could be explained by starvation ketoacidosis as well-some component of her metabolic acidosis probably is related to AKI/kidney issues. In any event-she is still actively vomiting-and will continue with IV insulin -until she is symptomatically better-able to tolerate diet-and her anion gap closes. She normally takes 15 units of Lantus  twice daily and is on a sliding scale at home.   Prolonged QTc K/Mg appears stable Avoid QTc prolonging medications Telemetry monitoring Repeat twelve-lead EKG later today.   Addendum: Repeat twelve-lead EKG this morning-shows significant improvement in her QTc-Will go ahead and place her on scheduled IV Reglan -continue telemetry monitoring.  Recheck twelve-lead EKG in AM.   HTN BP significantly elevated-if she is able to take oral meds (if not vomiting persistently) will place her back on Coreg /amlodipine  Otherwise we will use IV hydralazine /IV labetalol  as needed till her oral intake is better.   HLD Hold statins   History of PAD-s/p bilateral AKA Does not have prosthesis-uses a wheelchair mostly.   History of peripheral neuropathy Takes Neurontin -which will be held due to ongoing vomiting.     DVT prophylaxis: heparin  injection 5,000 Units Start: 03/24/24 0730 SCDs Start: 03/24/24  0405     Code Status: Full Code Family Communication: Family not available Disposition Plan:  Home Reason for continuing need for hospitalization: Workup and monitoring of acute kidney injury  Objective: Vitals:   03/25/24 0726 03/25/24 0745 03/25/24 1115 03/25/24 1510  BP:  (!) 147/94 135/80 (!) 146/86  Pulse:  (!) 109 94 96  Resp:  18 16 16   Temp: 98.3 F (36.8 C)  98.6 F (37 C) 97.7 F (36.5 C)  TempSrc: Oral   Oral  SpO2:  98% 98% 98%  Weight:      Height:        Intake/Output Summary (Last 24 hours) at 03/25/2024 1539 Last data filed at 03/25/2024 0850 Gross per 24 hour  Intake 716.01 ml  Output --  Net 716.01 ml   Filed Weights   03/24/24 0141  Weight: 59 kg    Examination:  Chronically ill in appearance sitting on the bed. HEENT: Obviously blind' pharynx clear no scleral icterus CV: Regular rate and rhythm no murmurs rubs gallops Lungs: Clear throughout Abdomen: Soft, nontender nondistended bowel sounds present Extremities: Bilateral AKA's which are clean dry intact  Data Reviewed: I have personally reviewed following labs and imaging studies  CBC: Recent Labs  Lab 03/24/24 0241 03/24/24 0301 03/24/24 0936 03/25/24 0332  WBC 13.5*  --  16.2* 17.2*  NEUTROABS 11.5*  --   --   --   HGB 12.0 12.6 10.6* 10.7*  HCT 39.7 37.0 35.7* 35.4*  MCV 95.9  --  97.3 96.5  PLT 499*  --  435* 465*   Basic Metabolic Panel: Recent Labs  Lab 03/24/24 0420 03/24/24 0936 03/24/24 1823 03/24/24 2326 03/25/24 0332 03/25/24 0641 03/25/24 1153  NA 144   < > 144 144 143 141 140  K 4.2   < > 3.7 3.9 3.7 3.7 3.6  CL 109   < > 111 111 111 109 111  CO2 15*   < > 18* 21* 20* 18* 19*  GLUCOSE 307*   < > 225* 196* 198* 165* 186*  BUN 58*   < > 55* 53* 51* 48* 47*  CREATININE 6.12*   < > 5.60* 5.47* 5.53* 5.27* 5.20*  CALCIUM  9.3   < > 9.0 9.2 9.0 8.8* 8.7*  MG 2.5*  --   --   --  2.4  --   --    < > = values in this interval not displayed.   GFR: Estimated Creatinine Clearance: 12.5 mL/min (A) (by C-G formula based on SCr of 5.2 mg/dL (H)). Liver Function  Tests: Recent Labs  Lab 03/24/24 0241 03/25/24 0332  AST 20 23  ALT 24 17  ALKPHOS 128* 103  BILITOT 0.5 <0.2  PROT 7.5 6.2*  ALBUMIN  3.1* 2.8*   Recent Labs  Lab 03/24/24 0241  LIPASE 17   No results for input(s): AMMONIA in the last 168 hours. Coagulation Profile: No results for input(s): INR, PROTIME in the last 168 hours. Cardiac Enzymes: No results for input(s): CKTOTAL, CKMB, CKMBINDEX, TROPONINI in the last 168 hours. ProBNP, BNP (last 5 results) No results for input(s): PROBNP, BNP in the last 8760 hours. HbA1C: No results for input(s): HGBA1C in the last 72 hours. CBG: Recent Labs  Lab 03/25/24 0429 03/25/24 0535 03/25/24 0627 03/25/24 0721 03/25/24 1117  GLUCAP 164* 154* 169* 136* 133*   Lipid Profile: No results for input(s): CHOL, HDL, LDLCALC, TRIG, CHOLHDL, LDLDIRECT in the last 72 hours. Thyroid Function Tests: No results for input(s):  TSH, T4TOTAL, FREET4, T3FREE, THYROIDAB in the last 72 hours. Anemia Panel: No results for input(s): VITAMINB12, FOLATE, FERRITIN, TIBC, IRON , RETICCTPCT in the last 72 hours. Sepsis Labs: No results for input(s): PROCALCITON, LATICACIDVEN in the last 168 hours.  No results found for this or any previous visit (from the past 240 hours).   Radiology Studies: CT ABDOMEN PELVIS WO CONTRAST Result Date: 03/24/2024 EXAM: CT ABDOMEN AND PELVIS WITHOUT CONTRAST 03/24/2024 04:17:00 AM TECHNIQUE: CT of the abdomen and pelvis was performed without the administration of intravenous contrast. Multiplanar reformatted images are provided for review. Automated exposure control, iterative reconstruction, and/or weight-based adjustment of the mA/kV was utilized to reduce the radiation dose to as low as reasonably achievable. COMPARISON: Multiple prior CTs back to 2017. The two most recent are both with contrast and dated 04/05/2023 and 03/24/2023. CLINICAL HISTORY: Abdominal pain,  acute, nonlocalized. History of gastroparesis and acute pancreatitis. 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 Lung bases are clear. The cardiac size is normal. LIVER: The liver is unremarkable. GALLBLADDER AND BILE DUCTS: Gallbladder is unremarkable. No biliary ductal dilatation. SPLEEN: The unenhanced spleen is unremarkable. PANCREAS: Previously, there was peripancreatic edema and fluid on the prior study, which is not seen today.The unenhanced pancreas is normal in size, contour, and attenuation. ADRENAL GLANDS: No adrenal mass. KIDNEYS, URETERS AND BLADDER: No contour-deforming mass in the unenhanced kidneys is seen. There is no urinary stone or obstruction. No hydronephrosis. No perinephric or periureteral stranding. The bladder is unremarkable for its degree of distention. GI AND BOWEL: The stomach is partially contracted. There is a short linear metallic structure anterior to the distal body of stomach which appears to be the terminal part of a gastric stimulator apparatus, the remainder of which has been removed since the prior study including the implanted battery that was previously in the subcutaneous left mid abdominal wall and the attached wiring previously extending up to this metallic density. There is no small bowel dilatation or inflammation. The appendix is prominent, contains contrast and measures 9 mm distally, but there are no inflammatory changes adjacent to it. A similar appendiceal measurement was obtained previously. Today there are thickened folds in the cecum, ascending, descending and sigmoid colon, faint inflammatory reaction over portions. This was seen on the prior studies as well, and is most likely due to an inflammatory or infectious colitis. There is no pneumatosis or portal venous gas. PERITONEUM AND RETROPERITONEUM: No free fluid, free hemorrhage, or free air. No incarcerated hernia. VASCULATURE: Aorta is normal  in caliber. Aortic, visceral, and iliofemoral branch vessel atherosclerosis is noted. No other significant vascular findings. This is age-advanced for a 34 year old. LYMPH NODES: No lymphadenopathy. REPRODUCTIVE ORGANS: The uterus and ovaries are unremarkable without contrast. The ovaries are normal in size. BONES AND SOFT TISSUES: No acute osseous abnormality. Subcutaneous scarring from gastric stimulator battery removal noted in the left mid-abdomen. Scattered pelvic phleboliths. IMPRESSION: 1. Thickened folds in the cecum, ascending, descending, and sigmoid colon, consistent with inflammatory or infectious colitis. 2. Stable prominent appendix measuring 9 mm distally, without adjacent inflammatory change. 3. Age-advanced atherosclerosis of the aortic visceral and iliofemoral branch vessels. 4. Residual gastric stimulator component anterior to the distal gastric body with left mid abdominal wall postprocedural scarring. Electronically signed by: Francis Quam MD 03/24/2024 04:55 AM EST RP Workstation: [id number]   DG Chest Port 1 View Result Date: 03/24/2024 EXAM: 1 VIEW(S) XRAY OF THE CHEST 03/24/2024 04:05:00 AM COMPARISON: Portable chest  04/05/2023. CLINICAL HISTORY: CP/AP, ARF. Chest pain/abdominal pain, acute renal failure. FINDINGS: LUNGS AND PLEURA: The lungs are hyperexpanded. No focal pulmonary opacity. There is limited evaluation of the lung bases. Similar findings were noted previously. No pleural effusion. No pneumothorax. HEART AND MEDIASTINUM: No acute abnormality of the cardiac and mediastinal silhouettes. BONES AND SOFT TISSUES: No acute osseous abnormality. IMPRESSION: 1. No acute cardiopulmonary abnormality. 2. Limited evaluation of the lung bases. Electronically signed by: Francis Quam MD 03/24/2024 04:26 AM EST RP Workstation: [id number]    Scheduled Meds:  carvedilol   6.25 mg Oral BID WC   heparin   5,000 Units Subcutaneous Q8H   insulin  aspart  0-5 Units Subcutaneous QHS   insulin   aspart  0-9 Units Subcutaneous TID WC   metoCLOPramide  (REGLAN ) injection  10 mg Intravenous TID AC   pantoprazole  (PROTONIX ) IV  40 mg Intravenous Q24H   Continuous Infusions:  sodium chloride      cefTRIAXone  (ROCEPHIN )  IV Stopped (03/25/24 0659)   dextrose      metronidazole  Stopped (03/25/24 0850)     LOS: 1 day   Time spent: 40 minutes  Lonni KANDICE Moose, MD  [facility]  03/25/2024, 3:39 PM   "

## 2024-03-25 NOTE — ED Notes (Signed)
 Attempt made to call report to nurse at 5W. No answer

## 2024-03-25 NOTE — Inpatient Diabetes Management (Signed)
 Inpatient Diabetes Program Recommendations  AACE/ADA: New Consensus Statement on Inpatient Glycemic Control (2015)  Target Ranges:  Prepandial:   less than 140 mg/dL      Peak postprandial:   less than 180 mg/dL (1-2 hours)      Critically ill patients:  140 - 180 mg/dL   Lab Results  Component Value Date   GLUCAP 136 (H) 03/25/2024   HGBA1C 6.8 (H) 04/05/2023    Review of Glycemic Control  Diabetes history: DM1 (very sensitive to insulin ) Outpatient Diabetes medications: Tresiba  15 BID, Humalog  2-16 units TID Current orders for Inpatient glycemic control: Transitioned off IV insulin  to Lantus  15 units 1x dose at 0330, Novolog  0-9 TID with meals and 0-5 HS  HgbA1C - 6.8% Endo is [facility] CBGs trending well.  Inpatient Diabetes Program Recommendations:    Consider decreasing Lantus  to 10 units BID  Decrease Novolog  to 0-6 Q4H while on liquid diet then TID with meals when eating CHO mod diet  Consider adding Novolog  meal coverage - 2-3 units TID with meals (hold if pt eating less than 50% or NPO)  Spoke with pt at bedside this am in ED. Our Inpatient Diabetes team is very familiar with pt from previous admissions. States her blood sugars have been running really good lately. Uses Dexcom G6 CGM. Has not seen Endo recently. Discussed HgbA1C of 6.8%. Still on liquid diet. Instructed pt to make appt with Endo after hospital discharge.  Will likely be transferred to [facility]. Continue to follow.  Thank you. Shona Brandy, RD, LDN, CDCES Inpatient Diabetes Coordinator [phone]

## 2024-03-28 ENCOUNTER — Ambulatory Visit: Attending: Orthopedic Surgery | Admitting: Physical Therapy

## 2024-03-31 ENCOUNTER — Ambulatory Visit: Admitting: Physical Therapy

## 2024-04-04 ENCOUNTER — Ambulatory Visit

## 2024-04-07 ENCOUNTER — Ambulatory Visit

## 2024-04-08 ENCOUNTER — Encounter (HOSPITAL_COMMUNITY)

## 2024-04-11 ENCOUNTER — Ambulatory Visit: Admitting: Physical Therapy

## 2024-04-14 ENCOUNTER — Ambulatory Visit

## 2024-04-15 ENCOUNTER — Encounter (HOSPITAL_COMMUNITY)

## 2024-04-18 ENCOUNTER — Ambulatory Visit

## 2024-04-21 ENCOUNTER — Ambulatory Visit: Admitting: Physical Therapy

## 2024-04-25 ENCOUNTER — Ambulatory Visit: Attending: Orthopedic Surgery
